# Patient Record
Sex: Female | Born: 1960
Health system: Midwestern US, Community
[De-identification: ages and names within clinical notes are randomized; demographics above are authoritative.]

## PROBLEM LIST (undated history)

## (undated) ENCOUNTER — Emergency Department (HOSPITAL_COMMUNITY): Admission: EM | Payer: Commercial Managed Care - HMO | Source: Home / Self Care

## (undated) ENCOUNTER — Emergency Department (HOSPITAL_COMMUNITY): Admission: EM | Payer: Medicare HMO | Source: Home / Self Care

## (undated) ENCOUNTER — Inpatient Hospital Stay (HOSPITAL_COMMUNITY): Payer: Self-pay

## (undated) DIAGNOSIS — J42 Unspecified chronic bronchitis: Secondary | ICD-10-CM

## (undated) DIAGNOSIS — R569 Unspecified convulsions: Secondary | ICD-10-CM

## (undated) DIAGNOSIS — R1033 Periumbilical pain: Secondary | ICD-10-CM

## (undated) DIAGNOSIS — M79671 Pain in right foot: Secondary | ICD-10-CM

## (undated) DIAGNOSIS — E785 Hyperlipidemia, unspecified: Secondary | ICD-10-CM

## (undated) DIAGNOSIS — F4312 Post-traumatic stress disorder, chronic: Secondary | ICD-10-CM

## (undated) DIAGNOSIS — R238 Other skin changes: Secondary | ICD-10-CM

## (undated) DIAGNOSIS — F50819 Binge eating disorder, unspecified: Secondary | ICD-10-CM

## (undated) DIAGNOSIS — R9431 Abnormal electrocardiogram [ECG] [EKG]: Secondary | ICD-10-CM

## (undated) DIAGNOSIS — J449 Chronic obstructive pulmonary disease, unspecified: Secondary | ICD-10-CM

## (undated) DIAGNOSIS — L821 Other seborrheic keratosis: Secondary | ICD-10-CM

## (undated) DIAGNOSIS — R001 Bradycardia, unspecified: Secondary | ICD-10-CM

## (undated) DIAGNOSIS — R109 Unspecified abdominal pain: Secondary | ICD-10-CM

## (undated) DIAGNOSIS — R296 Repeated falls: Secondary | ICD-10-CM

## (undated) DIAGNOSIS — F32A Depression, unspecified: Secondary | ICD-10-CM

## (undated) DIAGNOSIS — F22 Delusional disorders: Secondary | ICD-10-CM

## (undated) DIAGNOSIS — F5081 Binge eating disorder: Secondary | ICD-10-CM

## (undated) DIAGNOSIS — K921 Melena: Secondary | ICD-10-CM

## (undated) DIAGNOSIS — I639 Cerebral infarction, unspecified: Secondary | ICD-10-CM

## (undated) DIAGNOSIS — G931 Anoxic brain damage, not elsewhere classified: Secondary | ICD-10-CM

## (undated) DIAGNOSIS — G894 Chronic pain syndrome: Secondary | ICD-10-CM

## (undated) DIAGNOSIS — E119 Type 2 diabetes mellitus without complications: Secondary | ICD-10-CM

## (undated) DIAGNOSIS — Z9981 Dependence on supplemental oxygen: Secondary | ICD-10-CM

## (undated) DIAGNOSIS — K219 Gastro-esophageal reflux disease without esophagitis: Secondary | ICD-10-CM

## (undated) DIAGNOSIS — M1990 Unspecified osteoarthritis, unspecified site: Secondary | ICD-10-CM

## (undated) DIAGNOSIS — Z9989 Dependence on other enabling machines and devices: Secondary | ICD-10-CM

## (undated) DIAGNOSIS — R0600 Dyspnea, unspecified: Secondary | ICD-10-CM

## (undated) DIAGNOSIS — B86 Scabies: Secondary | ICD-10-CM

## (undated) DIAGNOSIS — R32 Unspecified urinary incontinence: Secondary | ICD-10-CM

## (undated) DIAGNOSIS — D649 Anemia, unspecified: Secondary | ICD-10-CM

## (undated) DIAGNOSIS — F419 Anxiety disorder, unspecified: Secondary | ICD-10-CM

## (undated) DIAGNOSIS — F319 Bipolar disorder, unspecified: Secondary | ICD-10-CM

## (undated) DIAGNOSIS — I6529 Occlusion and stenosis of unspecified carotid artery: Secondary | ICD-10-CM

## (undated) DIAGNOSIS — E876 Hypokalemia: Secondary | ICD-10-CM

## (undated) DIAGNOSIS — R0989 Other specified symptoms and signs involving the circulatory and respiratory systems: Secondary | ICD-10-CM

## (undated) DIAGNOSIS — R1010 Upper abdominal pain, unspecified: Secondary | ICD-10-CM

## (undated) DIAGNOSIS — R55 Syncope and collapse: Secondary | ICD-10-CM

## (undated) DIAGNOSIS — F209 Schizophrenia, unspecified: Secondary | ICD-10-CM

## (undated) DIAGNOSIS — I5032 Chronic diastolic (congestive) heart failure: Secondary | ICD-10-CM

## (undated) DIAGNOSIS — R4689 Other symptoms and signs involving appearance and behavior: Secondary | ICD-10-CM

## (undated) DIAGNOSIS — F250 Schizoaffective disorder, bipolar type: Secondary | ICD-10-CM

## (undated) DIAGNOSIS — T8859XA Other complications of anesthesia, initial encounter: Secondary | ICD-10-CM

## (undated) DIAGNOSIS — G4733 Obstructive sleep apnea (adult) (pediatric): Secondary | ICD-10-CM

## (undated) DIAGNOSIS — J302 Other seasonal allergic rhinitis: Secondary | ICD-10-CM

## (undated) DIAGNOSIS — I219 Acute myocardial infarction, unspecified: Secondary | ICD-10-CM

## (undated) DIAGNOSIS — F1421 Cocaine dependence, in remission: Secondary | ICD-10-CM

## (undated) DIAGNOSIS — F1410 Cocaine abuse, uncomplicated: Secondary | ICD-10-CM

## (undated) DIAGNOSIS — I469 Cardiac arrest, cause unspecified: Secondary | ICD-10-CM

## (undated) DIAGNOSIS — F17200 Nicotine dependence, unspecified, uncomplicated: Secondary | ICD-10-CM

## (undated) DIAGNOSIS — J349 Unspecified disorder of nose and nasal sinuses: Secondary | ICD-10-CM

## (undated) DIAGNOSIS — S82409A Unspecified fracture of shaft of unspecified fibula, initial encounter for closed fracture: Secondary | ICD-10-CM

## (undated) DIAGNOSIS — W19XXXA Unspecified fall, initial encounter: Secondary | ICD-10-CM

## (undated) DIAGNOSIS — K5900 Constipation, unspecified: Secondary | ICD-10-CM

## (undated) DIAGNOSIS — R062 Wheezing: Secondary | ICD-10-CM

## (undated) DIAGNOSIS — B370 Candidal stomatitis: Secondary | ICD-10-CM

## (undated) DIAGNOSIS — T4145XA Adverse effect of unspecified anesthetic, initial encounter: Secondary | ICD-10-CM

## (undated) DIAGNOSIS — I10 Essential (primary) hypertension: Secondary | ICD-10-CM

## (undated) DIAGNOSIS — R0602 Shortness of breath: Secondary | ICD-10-CM

## (undated) DIAGNOSIS — S21009A Unspecified open wound of unspecified breast, initial encounter: Secondary | ICD-10-CM

## (undated) DIAGNOSIS — N183 Chronic kidney disease, stage 3 unspecified: Secondary | ICD-10-CM

## (undated) DIAGNOSIS — R479 Unspecified speech disturbances: Secondary | ICD-10-CM

## (undated) DIAGNOSIS — S62308A Unspecified fracture of other metacarpal bone, initial encounter for closed fracture: Secondary | ICD-10-CM

## (undated) DIAGNOSIS — G9340 Encephalopathy, unspecified: Secondary | ICD-10-CM

## (undated) DIAGNOSIS — N898 Other specified noninflammatory disorders of vagina: Secondary | ICD-10-CM

## (undated) DIAGNOSIS — G988 Other disorders of nervous system: Secondary | ICD-10-CM

## (undated) DIAGNOSIS — J189 Pneumonia, unspecified organism: Secondary | ICD-10-CM

## (undated) DIAGNOSIS — F329 Major depressive disorder, single episode, unspecified: Secondary | ICD-10-CM

## (undated) DIAGNOSIS — M109 Gout, unspecified: Secondary | ICD-10-CM

## (undated) DIAGNOSIS — J45909 Unspecified asthma, uncomplicated: Secondary | ICD-10-CM

## (undated) DIAGNOSIS — F1911 Other psychoactive substance abuse, in remission: Secondary | ICD-10-CM

## (undated) DIAGNOSIS — Z9289 Personal history of other medical treatment: Secondary | ICD-10-CM

## (undated) DIAGNOSIS — N179 Acute kidney failure, unspecified: Secondary | ICD-10-CM

## (undated) DIAGNOSIS — K625 Hemorrhage of anus and rectum: Secondary | ICD-10-CM

## (undated) DIAGNOSIS — R451 Restlessness and agitation: Secondary | ICD-10-CM

## (undated) DIAGNOSIS — N189 Chronic kidney disease, unspecified: Secondary | ICD-10-CM

## (undated) DIAGNOSIS — J9611 Chronic respiratory failure with hypoxia: Secondary | ICD-10-CM

## (undated) DIAGNOSIS — E1140 Type 2 diabetes mellitus with diabetic neuropathy, unspecified: Secondary | ICD-10-CM

## (undated) DIAGNOSIS — F1910 Other psychoactive substance abuse, uncomplicated: Secondary | ICD-10-CM

## (undated) HISTORY — DX: Abnormal electrocardiogram (ECG) (EKG): R94.31

## (undated) HISTORY — DX: Anxiety disorder, unspecified: F41.9

## (undated) HISTORY — DX: Cerebral infarction, unspecified: I63.9

## (undated) HISTORY — DX: Other disorders of nervous system: G98.8

## (undated) HISTORY — DX: Binge eating disorder: F50.81

## (undated) HISTORY — DX: Acute myocardial infarction, unspecified: I21.9

## (undated) HISTORY — DX: Bradycardia, unspecified: R00.1

## (undated) HISTORY — DX: Other seasonal allergic rhinitis: J30.2

## (undated) HISTORY — DX: Occlusion and stenosis of unspecified carotid artery: I65.29

## (undated) HISTORY — DX: Chronic diastolic (congestive) heart failure: I50.32

## (undated) HISTORY — PX: UMBILICAL HERNIA REPAIR: SHX196

## (undated) HISTORY — PX: VAGINAL HYSTERECTOMY: SUR661

## (undated) HISTORY — PX: HERNIA REPAIR: SHX51

## (undated) HISTORY — DX: Hyperlipidemia, unspecified: E78.5

## (undated) HISTORY — DX: Unspecified urinary incontinence: R32

## (undated) HISTORY — DX: Chronic kidney disease, stage 3 unspecified: N18.30

## (undated) HISTORY — DX: Bipolar disorder, unspecified: F31.9

## (undated) HISTORY — DX: Binge eating disorder, unspecified: F50.819

## (undated) HISTORY — DX: Unspecified disorder of nose and nasal sinuses: J34.9

---
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
## 1992-01-17 DIAGNOSIS — Z9289 Personal history of other medical treatment: Secondary | ICD-10-CM

## 1992-01-17 HISTORY — DX: Personal history of other medical treatment: Z92.89

## 1997-05-29 ENCOUNTER — Encounter: Admission: RE | Admit: 1997-05-29 | Discharge: 1997-05-29 | Payer: Self-pay | Admitting: Family Medicine

## 1997-07-03 ENCOUNTER — Encounter: Admission: RE | Admit: 1997-07-03 | Discharge: 1997-07-03 | Payer: Self-pay | Admitting: Family Medicine

## 1997-07-07 ENCOUNTER — Encounter: Admission: RE | Admit: 1997-07-07 | Discharge: 1997-07-07 | Payer: Self-pay | Admitting: Family Medicine

## 1997-10-21 ENCOUNTER — Encounter: Admission: RE | Admit: 1997-10-21 | Discharge: 1997-10-21 | Payer: Self-pay | Admitting: Family Medicine

## 1997-10-23 ENCOUNTER — Encounter: Admission: RE | Admit: 1997-10-23 | Discharge: 1997-10-23 | Payer: Self-pay | Admitting: Family Medicine

## 1998-01-28 ENCOUNTER — Encounter: Admission: RE | Admit: 1998-01-28 | Discharge: 1998-01-28 | Payer: Self-pay | Admitting: Family Medicine

## 1998-02-15 ENCOUNTER — Encounter: Admission: RE | Admit: 1998-02-15 | Discharge: 1998-02-15 | Payer: Self-pay | Admitting: Family Medicine

## 1998-03-11 ENCOUNTER — Encounter: Admission: RE | Admit: 1998-03-11 | Discharge: 1998-03-11 | Payer: Self-pay | Admitting: Family Medicine

## 1998-03-15 ENCOUNTER — Encounter: Admission: RE | Admit: 1998-03-15 | Discharge: 1998-03-15 | Payer: Self-pay | Admitting: Family Medicine

## 1998-03-23 ENCOUNTER — Encounter: Admission: RE | Admit: 1998-03-23 | Discharge: 1998-03-23 | Payer: Self-pay | Admitting: Sports Medicine

## 1998-07-01 ENCOUNTER — Encounter: Admission: RE | Admit: 1998-07-01 | Discharge: 1998-07-01 | Payer: Self-pay | Admitting: Family Medicine

## 1998-07-12 ENCOUNTER — Encounter: Admission: RE | Admit: 1998-07-12 | Discharge: 1998-07-12 | Payer: Self-pay | Admitting: Family Medicine

## 1998-08-06 ENCOUNTER — Encounter: Admission: RE | Admit: 1998-08-06 | Discharge: 1998-08-06 | Payer: Self-pay | Admitting: Family Medicine

## 1998-11-01 ENCOUNTER — Emergency Department (HOSPITAL_COMMUNITY): Admission: EM | Admit: 1998-11-01 | Discharge: 1998-11-01 | Payer: Self-pay | Admitting: Emergency Medicine

## 1998-11-01 ENCOUNTER — Encounter: Payer: Self-pay | Admitting: Emergency Medicine

## 2004-12-15 ENCOUNTER — Ambulatory Visit: Payer: Self-pay | Admitting: Family Medicine

## 2004-12-19 ENCOUNTER — Ambulatory Visit: Payer: Self-pay | Admitting: *Deleted

## 2005-04-12 ENCOUNTER — Emergency Department (HOSPITAL_COMMUNITY): Admission: EM | Admit: 2005-04-12 | Discharge: 2005-04-13 | Payer: Self-pay | Admitting: Emergency Medicine

## 2005-06-21 ENCOUNTER — Ambulatory Visit: Payer: Self-pay | Admitting: Nurse Practitioner

## 2005-07-10 ENCOUNTER — Encounter (HOSPITAL_COMMUNITY): Admission: RE | Admit: 2005-07-10 | Discharge: 2005-10-04 | Payer: Self-pay | Admitting: Internal Medicine

## 2005-07-10 ENCOUNTER — Ambulatory Visit: Payer: Self-pay | Admitting: Internal Medicine

## 2005-12-21 ENCOUNTER — Inpatient Hospital Stay (HOSPITAL_COMMUNITY): Admission: EM | Admit: 2005-12-21 | Discharge: 2005-12-22 | Payer: Self-pay | Admitting: Emergency Medicine

## 2008-03-26 IMAGING — CR DG CHEST 2V
2 series · 2 of 2 positions shown · non-contrast
Comparison: None available.

CLINICAL DATA: 44-year-old ? productive cough, shortness of breath. 
CHEST - 2 VIEW:

[w chest pa]
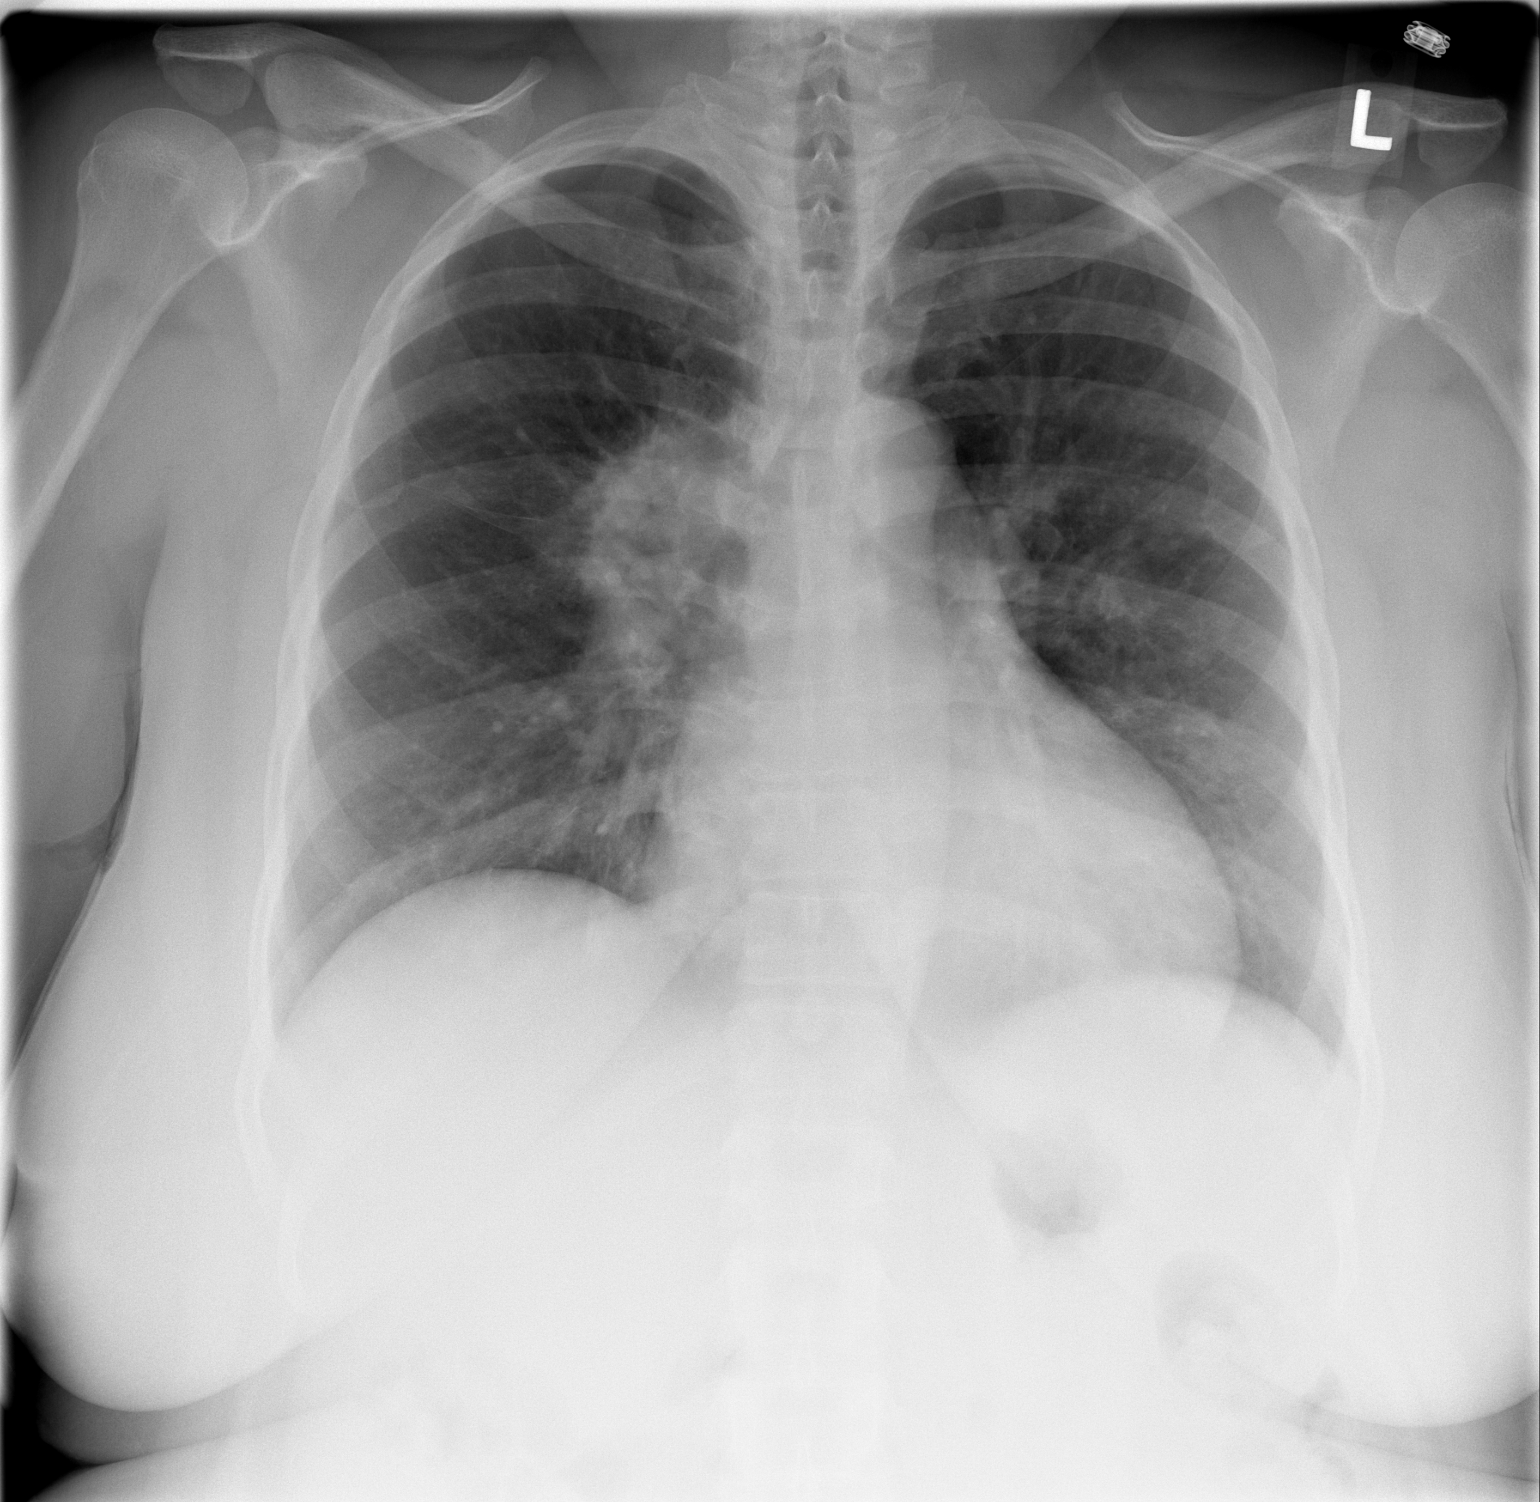

[w chest lat]
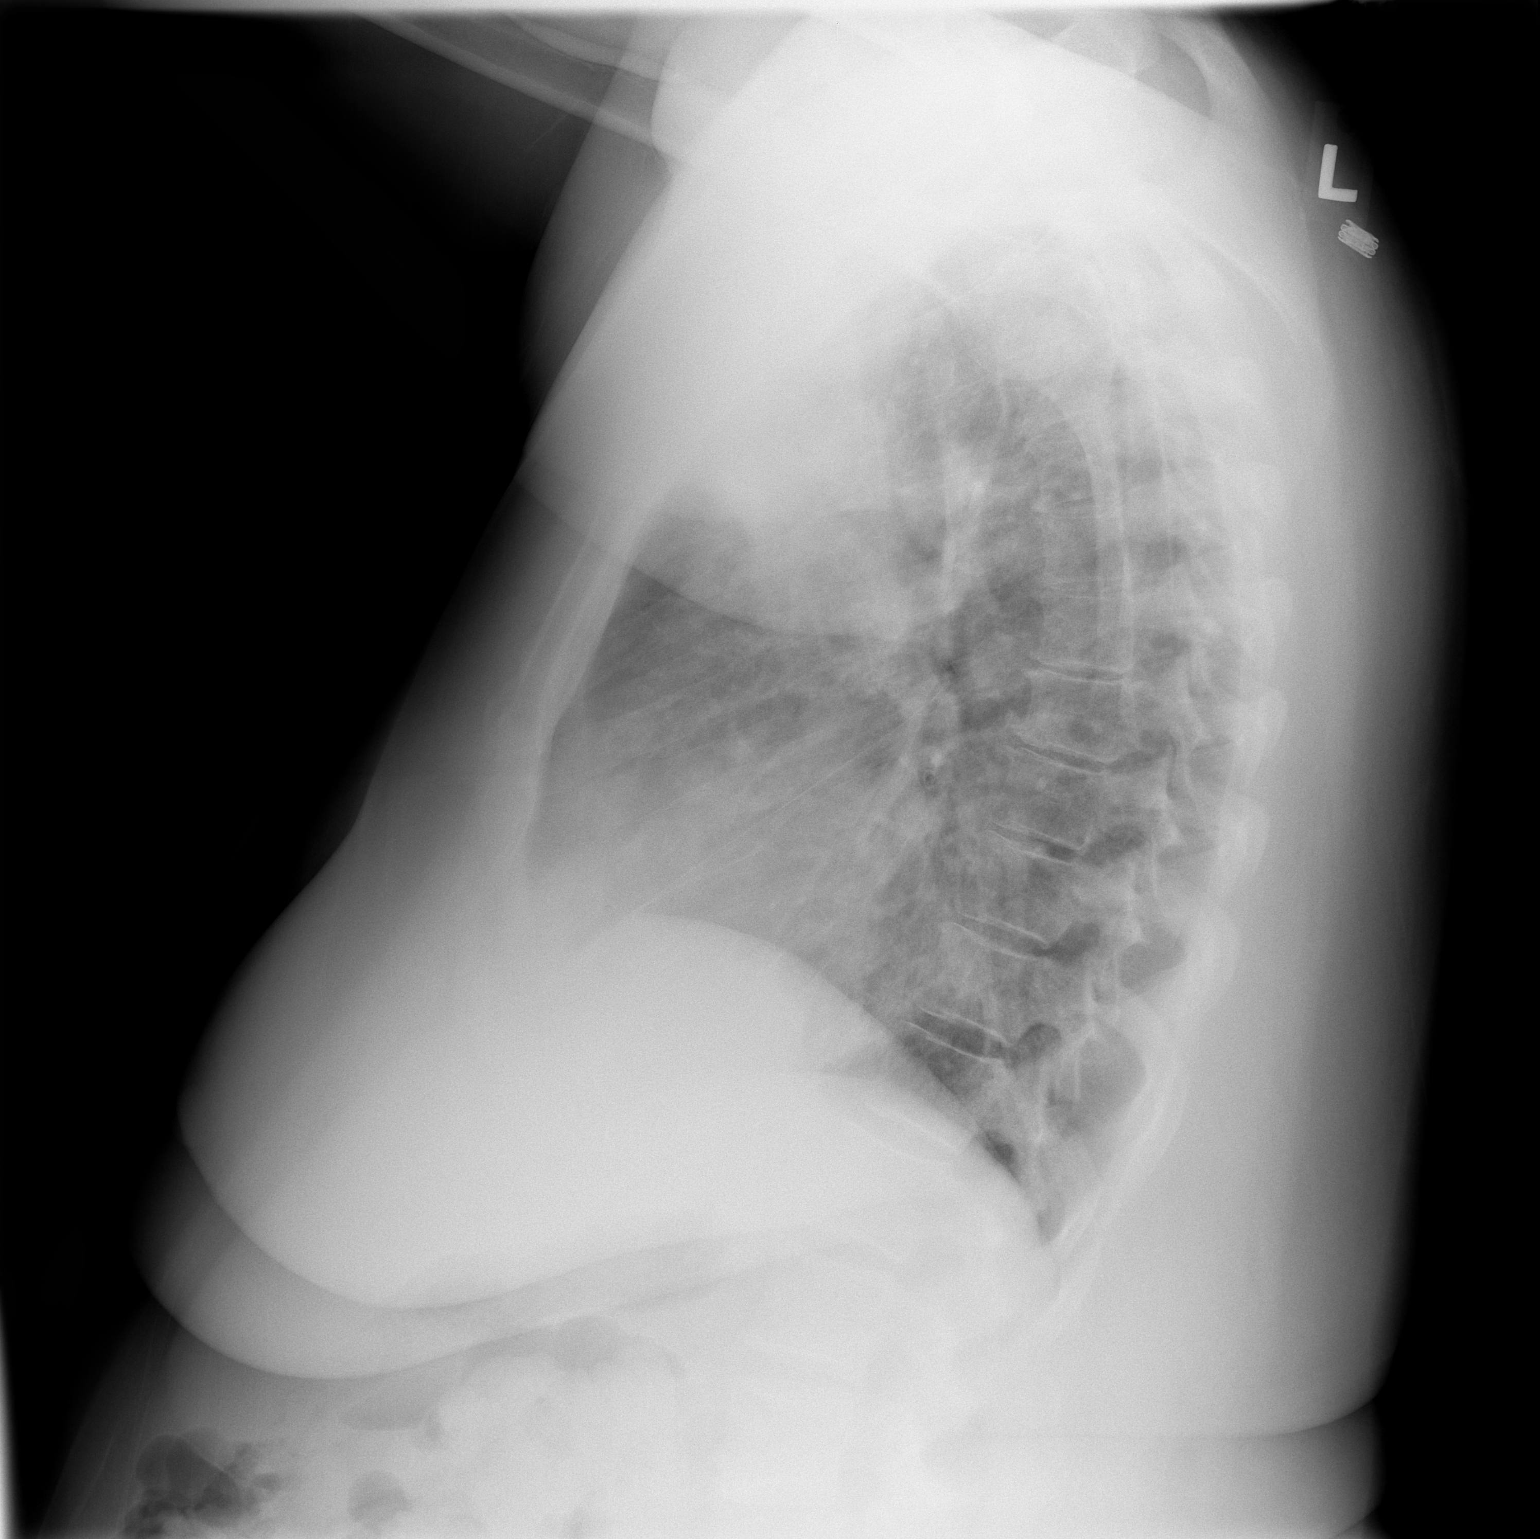

[2 of 2 positions shown; findings below may reference images not displayed]

FINDINGS: Cardiac silhouette is borderline.  Prominent right hilar contour.  This may be an infiltrate in the anterior segment of the right upper lobe but could not exclude a mass.  Recommend a posttreatment follow-up chest x-ray to make sure this resolves.  No effusions.  The bony structures are intact.
IMPRESSION: 1.  Right hilar density is most likely an infiltrate given the patient?s age and symptoms.  I would however recommend a follow-up posttreatment chest x-ray to make sure that this resolves.  
2.  Borderline heart size.

## 2008-12-03 IMAGING — CR DG CHEST 1V PORT
1 series · 1 of 1 positions shown · non-contrast
Comparison: 04/13/05.

CLINICAL DATA: Chest pain and dyspnea. 
 PORTABLE CHEST - 1 VIEW ? 12/21/05 (9944 HOURS):

[view not recorded]
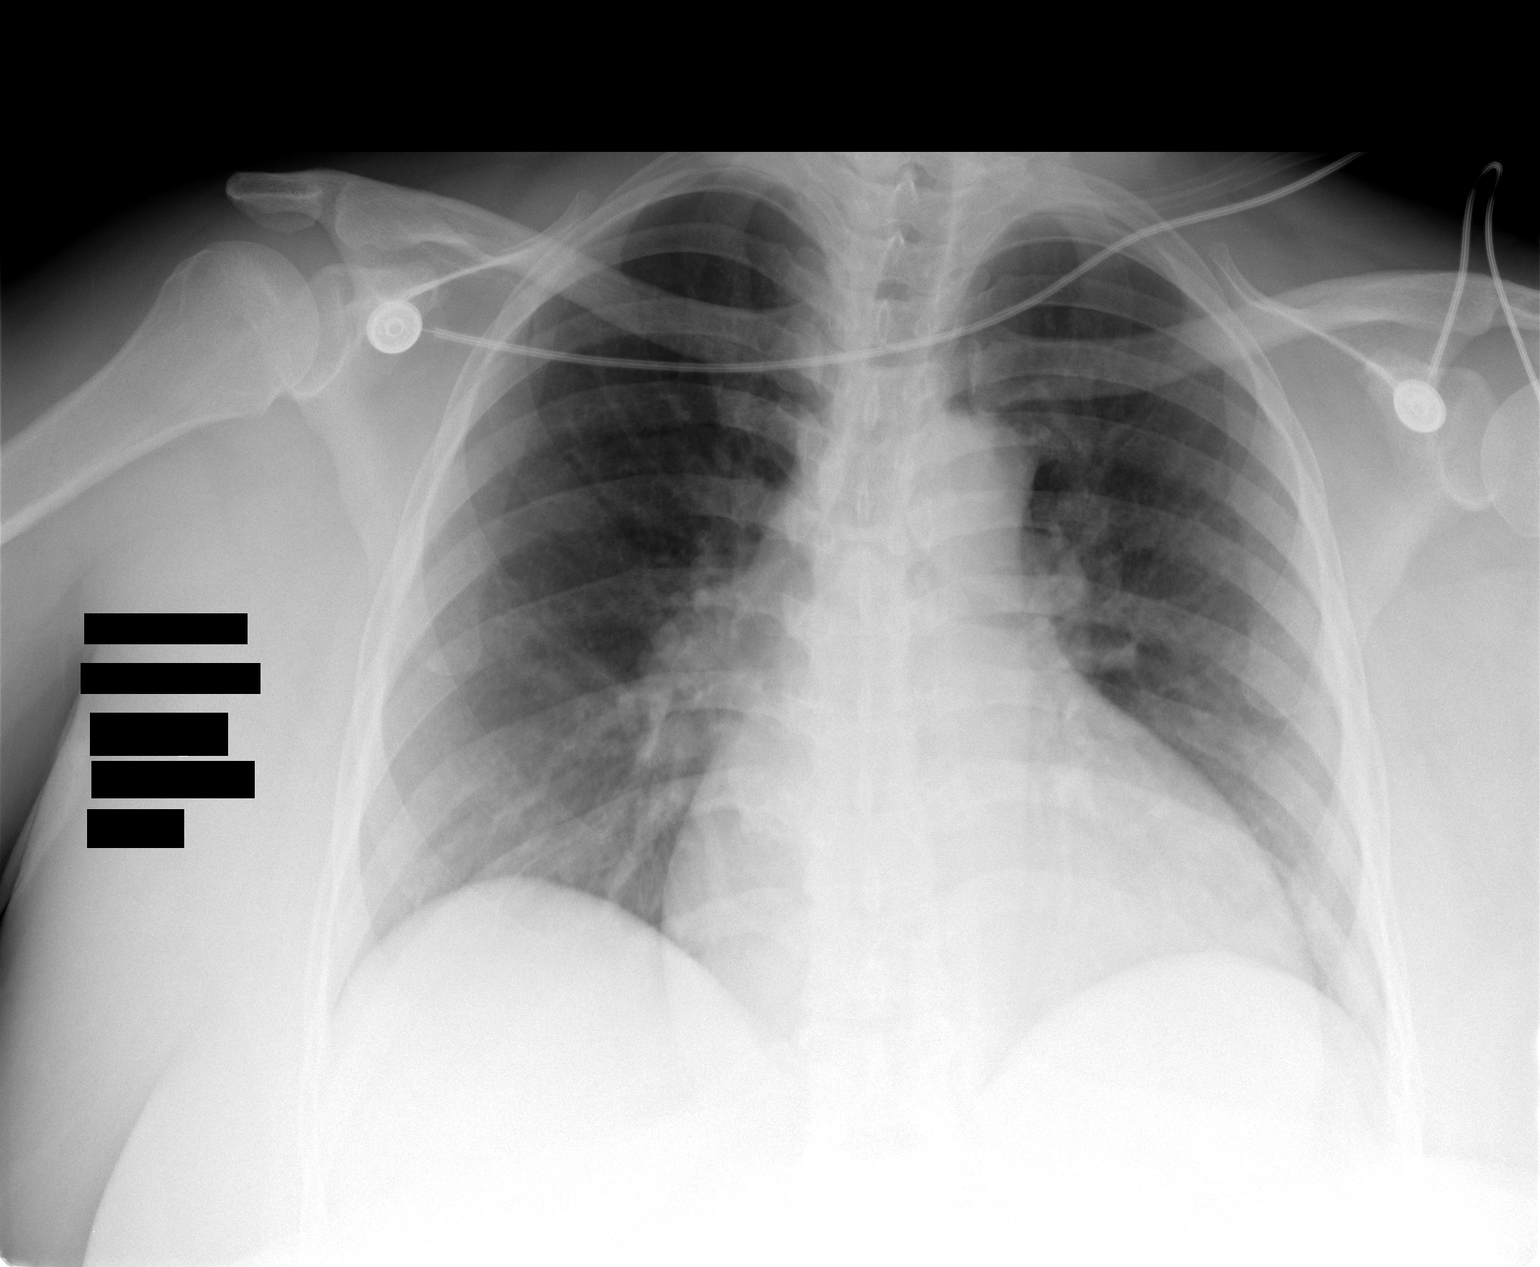

[1 of 1 positions shown; findings below may reference images not displayed]

FINDINGS: There are low lung volumes and the heart remains mildly enlarged.   The previously demonstrated right perihilar density has resolved.  The lungs are now clear.  There is no pleural effusion or pneumothorax.
IMPRESSION: Cardiomegaly with low lung volumes.  No acute chest findings.

## 2010-10-03 ENCOUNTER — Emergency Department (HOSPITAL_COMMUNITY): Payer: Medicaid Other

## 2010-10-03 ENCOUNTER — Inpatient Hospital Stay (INDEPENDENT_AMBULATORY_CARE_PROVIDER_SITE_OTHER)
Admission: RE | Admit: 2010-10-03 | Discharge: 2010-10-03 | Disposition: A | Discharge status: Home / Self Care | Payer: Medicaid Other | Source: Ambulatory Visit | Attending: Family Medicine | Admitting: Family Medicine

## 2010-10-03 ENCOUNTER — Emergency Department (HOSPITAL_COMMUNITY)
Admission: EM | Admit: 2010-10-03 | Discharge: 2010-10-03 | Discharge status: Against Medical Advice | Payer: Medicaid Other | Source: Home / Self Care | Attending: Emergency Medicine | Admitting: Emergency Medicine

## 2010-10-03 ENCOUNTER — Inpatient Hospital Stay (HOSPITAL_COMMUNITY)
Admission: EM | Admit: 2010-10-03 | Discharge: 2010-10-06 | DRG: 190 | Disposition: A | Discharge status: Home / Self Care | Payer: Medicaid Other | Attending: Internal Medicine | Admitting: Internal Medicine

## 2010-10-03 DIAGNOSIS — Z8673 Personal history of transient ischemic attack (TIA), and cerebral infarction without residual deficits: Secondary | ICD-10-CM

## 2010-10-03 DIAGNOSIS — I252 Old myocardial infarction: Secondary | ICD-10-CM

## 2010-10-03 DIAGNOSIS — J189 Pneumonia, unspecified organism: Secondary | ICD-10-CM | POA: Diagnosis present

## 2010-10-03 DIAGNOSIS — R0989 Other specified symptoms and signs involving the circulatory and respiratory systems: Secondary | ICD-10-CM

## 2010-10-03 DIAGNOSIS — I509 Heart failure, unspecified: Secondary | ICD-10-CM | POA: Diagnosis present

## 2010-10-03 DIAGNOSIS — I517 Cardiomegaly: Secondary | ICD-10-CM | POA: Diagnosis present

## 2010-10-03 DIAGNOSIS — E119 Type 2 diabetes mellitus without complications: Secondary | ICD-10-CM | POA: Diagnosis present

## 2010-10-03 DIAGNOSIS — R079 Chest pain, unspecified: Secondary | ICD-10-CM

## 2010-10-03 DIAGNOSIS — G4733 Obstructive sleep apnea (adult) (pediatric): Secondary | ICD-10-CM | POA: Diagnosis present

## 2010-10-03 DIAGNOSIS — T380X5A Adverse effect of glucocorticoids and synthetic analogues, initial encounter: Secondary | ICD-10-CM | POA: Diagnosis present

## 2010-10-03 DIAGNOSIS — Z889 Allergy status to unspecified drugs, medicaments and biological substances status: Secondary | ICD-10-CM

## 2010-10-03 DIAGNOSIS — D72829 Elevated white blood cell count, unspecified: Secondary | ICD-10-CM | POA: Diagnosis present

## 2010-10-03 DIAGNOSIS — M069 Rheumatoid arthritis, unspecified: Secondary | ICD-10-CM | POA: Diagnosis present

## 2010-10-03 DIAGNOSIS — J441 Chronic obstructive pulmonary disease with (acute) exacerbation: Principal | ICD-10-CM | POA: Diagnosis present

## 2010-10-03 DIAGNOSIS — R32 Unspecified urinary incontinence: Secondary | ICD-10-CM | POA: Diagnosis present

## 2010-10-03 DIAGNOSIS — R0609 Other forms of dyspnea: Secondary | ICD-10-CM

## 2010-10-03 DIAGNOSIS — J9610 Chronic respiratory failure, unspecified whether with hypoxia or hypercapnia: Secondary | ICD-10-CM | POA: Diagnosis present

## 2010-10-03 DIAGNOSIS — I10 Essential (primary) hypertension: Secondary | ICD-10-CM | POA: Diagnosis present

## 2010-10-03 DIAGNOSIS — Z79899 Other long term (current) drug therapy: Secondary | ICD-10-CM

## 2010-10-03 DIAGNOSIS — Z882 Allergy status to sulfonamides status: Secondary | ICD-10-CM

## 2010-10-03 DIAGNOSIS — Z9981 Dependence on supplemental oxygen: Secondary | ICD-10-CM

## 2010-10-03 DIAGNOSIS — I5030 Unspecified diastolic (congestive) heart failure: Secondary | ICD-10-CM | POA: Diagnosis present

## 2010-10-04 LAB — DIFFERENTIAL
Basophils Absolute: 0 10*3/uL (ref 0.0–0.1)
Basophils Relative: 0 % (ref 0–1)
Eosinophils Absolute: 0.3 10*3/uL (ref 0.0–0.7)
Eosinophils Relative: 4 % (ref 0–5)
Lymphocytes Relative: 35 % (ref 12–46)
Lymphs Abs: 2.6 10*3/uL (ref 0.7–4.0)
Monocytes Absolute: 0.4 10*3/uL (ref 0.1–1.0)
Monocytes Relative: 6 % (ref 3–12)
Neutro Abs: 4.2 10*3/uL (ref 1.7–7.7)
Neutrophils Relative %: 56 % (ref 43–77)

## 2010-10-04 LAB — GLUCOSE, CAPILLARY
Glucose-Capillary: 113 mg/dL — ABNORMAL HIGH (ref 70–99)
Glucose-Capillary: 146 mg/dL — ABNORMAL HIGH (ref 70–99)
Glucose-Capillary: 144 mg/dL — ABNORMAL HIGH (ref 70–99)

## 2010-10-04 LAB — CBC
HCT: 32.4 % — ABNORMAL LOW (ref 36.0–46.0)
Hemoglobin: 10.6 g/dL — ABNORMAL LOW (ref 12.0–15.0)
MCH: 30.1 pg (ref 26.0–34.0)
MCHC: 32.7 g/dL (ref 30.0–36.0)
MCV: 92 fL (ref 78.0–100.0)
Platelets: 302 10*3/uL (ref 150–400)
RBC: 3.52 MIL/uL — ABNORMAL LOW (ref 3.87–5.11)
RDW: 14.7 % (ref 11.5–15.5)
WBC: 7.5 10*3/uL (ref 4.0–10.5)

## 2010-10-04 LAB — POCT I-STAT 3, ART BLOOD GAS (G3+)
Acid-Base Excess: 1 mmol/L (ref 0.0–2.0)
Bicarbonate: 27.4 mEq/L — ABNORMAL HIGH (ref 20.0–24.0)
O2 Saturation: 97 %
Patient temperature: 98.6
TCO2: 29 mmol/L (ref 0–100)
pCO2 arterial: 51.3 mmHg — ABNORMAL HIGH (ref 35.0–45.0)
pH, Arterial: 7.336 — ABNORMAL LOW (ref 7.350–7.400)
pO2, Arterial: 97 mmHg (ref 80.0–100.0)

## 2010-10-04 LAB — BASIC METABOLIC PANEL
BUN: 14 mg/dL (ref 6–23)
CO2: 29 mEq/L (ref 19–32)
Calcium: 9.3 mg/dL (ref 8.4–10.5)
Chloride: 105 mEq/L (ref 96–112)
Creatinine, Ser: 0.97 mg/dL (ref 0.50–1.10)
GFR calc Af Amer: >60 mL/min (ref >60)
GFR calc non Af Amer: >60 mL/min (ref >60)
Glucose, Bld: 81 mg/dL (ref 70–99)
Potassium: 3.6 mEq/L (ref 3.5–5.1)
Sodium: 141 mEq/L (ref 135–145)

## 2010-10-04 LAB — URINALYSIS, ROUTINE W REFLEX MICROSCOPIC
Bilirubin Urine: NEGATIVE
Glucose, UA: NEGATIVE mg/dL
Hgb urine dipstick: NEGATIVE
Ketones, ur: NEGATIVE mg/dL
Leukocytes, UA: NEGATIVE
Nitrite: NEGATIVE
Protein, ur: NEGATIVE mg/dL
Specific Gravity, Urine: 1.022 (ref 1.005–1.030)
Urobilinogen, UA: 1 mg/dL (ref 0.0–1.0)
pH: 5.5 (ref 5.0–8.0)

## 2010-10-04 LAB — POCT I-STAT TROPONIN I
Troponin i, poc: 0.01 ng/mL (ref 0.00–0.08)
Troponin i, poc: 0.01 ng/mL (ref 0.00–0.08)

## 2010-10-04 LAB — PRO B NATRIURETIC PEPTIDE: Pro B Natriuretic peptide (BNP): 176.5 pg/mL — ABNORMAL HIGH (ref 0–125)

## 2010-10-05 LAB — CBC
HCT: 33.7 % — ABNORMAL LOW (ref 36.0–46.0)
Hemoglobin: 10.7 g/dL — ABNORMAL LOW (ref 12.0–15.0)
MCH: 29.6 pg (ref 26.0–34.0)
MCHC: 31.8 g/dL (ref 30.0–36.0)
MCV: 93.1 fL (ref 78.0–100.0)
Platelets: 349 10*3/uL (ref 150–400)
RBC: 3.62 MIL/uL — ABNORMAL LOW (ref 3.87–5.11)
RDW: 15.2 % (ref 11.5–15.5)
WBC: 17.7 10*3/uL — ABNORMAL HIGH (ref 4.0–10.5)

## 2010-10-05 LAB — HEMOGLOBIN A1C
Hgb A1c MFr Bld: 5.8 % — ABNORMAL HIGH (ref <5.7)
Mean Plasma Glucose: 120 mg/dL — ABNORMAL HIGH (ref <117)

## 2010-10-05 LAB — URINE CULTURE
Colony Count: 90000
Culture  Setup Time: 201209180848

## 2010-10-05 LAB — GLUCOSE, CAPILLARY
Glucose-Capillary: 123 mg/dL — ABNORMAL HIGH (ref 70–99)
Glucose-Capillary: 136 mg/dL — ABNORMAL HIGH (ref 70–99)
Glucose-Capillary: 165 mg/dL — ABNORMAL HIGH (ref 70–99)
Glucose-Capillary: 95 mg/dL (ref 70–99)
Glucose-Capillary: 97 mg/dL (ref 70–99)

## 2010-10-05 LAB — BASIC METABOLIC PANEL
BUN: 19 mg/dL (ref 6–23)
CO2: 28 mEq/L (ref 19–32)
Calcium: 9.9 mg/dL (ref 8.4–10.5)
Chloride: 104 mEq/L (ref 96–112)
Creatinine, Ser: 0.95 mg/dL (ref 0.50–1.10)
GFR calc Af Amer: >60 mL/min (ref >60)
GFR calc non Af Amer: >60 mL/min (ref >60)
Glucose, Bld: 105 mg/dL — ABNORMAL HIGH (ref 70–99)
Potassium: 4.2 mEq/L (ref 3.5–5.1)
Sodium: 142 mEq/L (ref 135–145)

## 2010-10-06 LAB — CBC
HCT: 33.2 % — ABNORMAL LOW (ref 36.0–46.0)
Hemoglobin: 10.5 g/dL — ABNORMAL LOW (ref 12.0–15.0)
MCH: 29.4 pg (ref 26.0–34.0)
MCHC: 31.6 g/dL (ref 30.0–36.0)
MCV: 93 fL (ref 78.0–100.0)
Platelets: 341 10*3/uL (ref 150–400)
RBC: 3.57 MIL/uL — ABNORMAL LOW (ref 3.87–5.11)
RDW: 15.1 % (ref 11.5–15.5)
WBC: 13.4 10*3/uL — ABNORMAL HIGH (ref 4.0–10.5)

## 2010-10-06 LAB — BASIC METABOLIC PANEL
BUN: 20 mg/dL (ref 6–23)
CO2: 30 mEq/L (ref 19–32)
Calcium: 9.7 mg/dL (ref 8.4–10.5)
Chloride: 103 mEq/L (ref 96–112)
Creatinine, Ser: 0.88 mg/dL (ref 0.50–1.10)
GFR calc Af Amer: >60 mL/min (ref >60)
GFR calc non Af Amer: >60 mL/min (ref >60)
Glucose, Bld: 132 mg/dL — ABNORMAL HIGH (ref 70–99)
Potassium: 4.3 mEq/L (ref 3.5–5.1)
Sodium: 140 mEq/L (ref 135–145)

## 2010-10-06 LAB — GLUCOSE, CAPILLARY
Glucose-Capillary: 131 mg/dL — ABNORMAL HIGH (ref 70–99)
Glucose-Capillary: 98 mg/dL (ref 70–99)
Glucose-Capillary: 127 mg/dL — ABNORMAL HIGH (ref 70–99)
Glucose-Capillary: 152 mg/dL — ABNORMAL HIGH (ref 70–99)

## 2010-10-18 ENCOUNTER — Ambulatory Visit (INDEPENDENT_AMBULATORY_CARE_PROVIDER_SITE_OTHER): Payer: Medicaid Other | Admitting: Emergency Medicine

## 2010-10-18 ENCOUNTER — Encounter: Payer: Self-pay | Admitting: Emergency Medicine

## 2010-10-18 DIAGNOSIS — E119 Type 2 diabetes mellitus without complications: Secondary | ICD-10-CM

## 2010-10-18 DIAGNOSIS — G4733 Obstructive sleep apnea (adult) (pediatric): Secondary | ICD-10-CM

## 2010-10-18 DIAGNOSIS — Z9989 Dependence on other enabling machines and devices: Secondary | ICD-10-CM

## 2010-10-18 DIAGNOSIS — J45909 Unspecified asthma, uncomplicated: Secondary | ICD-10-CM

## 2010-10-18 DIAGNOSIS — E669 Obesity, unspecified: Secondary | ICD-10-CM

## 2010-10-18 DIAGNOSIS — I10 Essential (primary) hypertension: Secondary | ICD-10-CM

## 2010-10-18 HISTORY — DX: Type 2 diabetes mellitus without complications: E11.9

## 2010-10-18 HISTORY — DX: Morbid (severe) obesity due to excess calories: E66.01

## 2010-10-18 HISTORY — DX: Obstructive sleep apnea (adult) (pediatric): G47.33

## 2010-10-18 HISTORY — DX: Unspecified asthma, uncomplicated: J45.909

## 2010-10-18 NOTE — Patient Instructions (Signed)
Continue your Symbicort and Singulair as you are taking them Use ProAir if needed for shortness of breath We will obtain copies of your records from [place].  Follow with Dr Lamonte Sakai in 1 month

## 2010-10-18 NOTE — Assessment & Plan Note (Signed)
Unclear severity or pattern. She uses albuterol q4h in addition to symbicort. She says it is to help with secretion clearance.  - need to obtain all testing, PFT, etc, records from [place] (Dr Manuela Neptune) - continue these meds for now

## 2010-10-18 NOTE — Assessment & Plan Note (Signed)
-   continue CPAP and obtain copies of PSG

## 2010-10-18 NOTE — Progress Notes (Signed)
  Subjective:    Patient ID: Tiffany [name], female    DOB: 1960/10/09, 50 y.o.   MRN: [id number]  HPI 50 yo never smoker, hx CAD/MI, HTN, DM, hyperlipidemia, allergic rhinitis, OSA (CPAP, Advanced). Dx with asthma as a child, started on O2 in 2000. Uses it prn. Tells me that she was rx for CAP with hemoptysis Zigmund Daniel) in 2009.  Has been on Singulair and Symbicort. Also uses ProAir prn, often every 4 hours. Last flare  - 1 week ago was in [facility] for exacerbation. Last PFT were 2 yrs ago in [place].    Review of Systems  Constitutional: Positive for unexpected weight change. Negative for fever.  HENT: Positive for nosebleeds, congestion, sore throat, trouble swallowing, postnasal drip and sinus pressure. Negative for ear pain, rhinorrhea, sneezing and dental problem.   Eyes: Negative for redness and itching.  Respiratory: Positive for cough, chest tightness, shortness of breath and wheezing.   Cardiovascular: Positive for leg swelling. Negative for palpitations.  Gastrointestinal: Positive for nausea. Negative for vomiting.  Genitourinary: Positive for dysuria.  Musculoskeletal: Positive for joint swelling.  Skin: Negative for rash.  Neurological: Positive for headaches.  Hematological: Bruises/bleeds easily.  Psychiatric/Behavioral: Negative for dysphoric mood. The patient is nervous/anxious.    Past Medical History  Diagnosis Date  . High blood pressure   . Incontinence   . Anxiety   . Manic depression   . Heart attack   . Diabetes mellitus     class 2  . Asthma   . Emphysema   . High cholesterol   . Stroke   . Sinus trouble   . Seasonal allergies   . Sleep apnea   . Disorder of nervous system      Family History  Problem Relation Age of Onset  . Cancer Father     prostate  . Heart failure      cousin  . Cancer Mother     lung     History   Social History  . Marital Status: Widowed    Spouse Name: N/A    Number of Children: 3  . Years of Education: N/A    Occupational History  . disabled     factory production   Social History Main Topics  . Smoking status: Never Smoker   . Smokeless tobacco: Not on file  . Alcohol Use: No  . Drug Use: No  . Sexually Active: Not on file   Other Topics Concern  . Not on file   Social History Narrative  . No narrative on file     Allergies  Allergen Reactions  . Codeine     nausea  . Sulfa Antibiotics      No outpatient prescriptions prior to visit.       Objective:   Physical Exam  Gen: Pleasant, obese woman, in no distress,  Slightly depressed affect  ENT: No lesions,  mouth clear,  oropharynx dry, no postnasal drip  Neck: No JVD, no TMG, no carotid bruits  Lungs: No use of accessory muscles, no dullness to percussion, clear without rales or rhonchi  Cardiovascular: RRR, heart sounds normal, no murmur or gallops, no peripheral edema  Musculoskeletal: No deformities, no cyanosis or clubbing  Neuro: alert, non focal  Skin: Warm, no lesions or rashes      Assessment & Plan:

## 2010-10-19 NOTE — H&P (Signed)
Tiffany [name], Tiffany [name]               ACCOUNT NO.:  [id number]  MEDICAL RECORD NO.:  [id number]  LOCATION:  [facility]                         FACILITY:  [facility]  PHYSICIAN:  Ria Bush, MD         DATE OF BIRTH:  25-Apr-1960  DATE OF ADMISSION:  10/03/2010 DATE OF DISCHARGE:                             HISTORY & PHYSICAL   PRIMARY CARE PHYSICIAN:  Unknown.  She was receiving most of her care in [place] and just moved here.  CHIEF COMPLAINT:  Difficulty breathing.  HISTORY OF PRESENT ILLNESS:  This is a 50 year old female, very obese with a reported history of asthma/bronchitis/emphysema, history of MI, history of CVA, diabetes, hypertension, on home O2, who presents with a 1-week history of a cough that was initially clearish sputum and has been progressively getting thicker and more white and green.  She has also been complaining of difficulty breathing and wheezing for the past weekend, associated with chest tightness and a feeling that there is a lot of "secretions" in her lungs.  She states that these sensations are consistent with prior episodes of asthma.  She was getting worried because she apparently had a bad pneumonia in January of this year and was intubated in the ICU at a hospital in [place] near [place]  She came to the emergency room where her initial vitals were temperature 98.1, blood pressure 175/119, pulse 88, and respirations 19.  She was satting 95%.  Her workup in the emergency room showed a negative troponin x2, a chemistry panel fairly unremarkable, CBC fairly unremarkable, and a chest x-ray showing mild cardiomegaly with central vascular congestion and interstitial prominence, but no focal consolidation.  She was noted to be very somnolent and not being very conversant, and so she got an ABG which showed a pH of 7.33, pCO2 of 51, a bicarb of 27, and a pO2 of 97%, satting 97%.  She was started on BiPAP which she reports compliance with at home.  The patient  otherwise denies any nausea, vomiting, abdominal pain, diarrhea, or any other dizziness or lightheadedness; however, she does report that she fell 3 times last Wednesday on the street due to some weakness, but did not have any loss of consciousness.  Review of systems otherwise unremarkable.  She does report that she had been having difficulty getting her nebulizers because she moved here from [place] 3 days ago and all her meds and nebs are locked up.  PAST MEDICAL HISTORY:  Scant in the E-Chart, but noted for history of MI, history of CVA, history of asthma/bronchitis/emphysema, and she states she is "on and off" home oxygen.  There is also some unclear history of seizures/falls/syncope.  She also reports history of incontinence, rheumatoid arthritis, diabetes, hypertension, and states that she uses a CPAP at home.  Home medication list is unable to be reconciled fully because the patient does not really know where she takes.  She did provide a list and includes: 1. Clonazepam. 2. Lisinopril. 3. Oxybutynin chloride. 4. Ranitidine. 5. Clonazepam. 6. Furosemide. 7. Metformin. 8. Naproxen sodium. 9. Proventil. 10.Valacyclovir. 11.Singulair. 12.Tramadol/acetaminophen. 13.Verapamil. 14.Flexeril. 15.Singulair.  She has allergies that she reports to [place], and also  SOME TYPE OF EPIDURAL INFUSION which she was given at birth to her son which caused some type of bad reaction.  SOCIAL HISTORY:  [name] a never smoker and does not drink.  [name] on disability due to her comorbidities.  She just moved here from [place] about 3 days ago and most of her doctor's care has been in [place].  FAMILY HISTORY:  Her mother and father had heart disease.  Her aunt died of CHF.  Her cousin had diabetes and requiring a kidney transplant.  PHYSICAL EXAMINATION:  VITAL SIGNS:  Blood pressure was 116/79, pulse 116/79, pulse 69-70, 95% on room air, respirations 17. GENERAL:  She  is a very large woman in the ED stretcher with her son sleeping in a chair next to her.  [name] wearing CPAP and breathing comfortably and resting well.  I woke her up, took CPAP off, and she was conversant, able to relate her history.  She appears lethargic, but is appropriate. HEENT:  Her pupils are equal, round, reactive to light.  Her extraocular muscles are intact.  Her sclerae are clear.  Her mouth is very dry appearing, but overall normal with no oropharyngeal lesions. LUNGS:  Poor breath sounds.  She has bilateral coarse sounding breath sounds at her bases that improves as you move towards the apex.  There is some end-expiratory wheezing, fairly transiently and diffusely. HEART:  Regular rate and rhythm without any murmurs or gallops.  I do not appreciate any adventitious heart sounds. ABDOMEN:  Massive.  It is soft, nontender, nondistended and benign. EXTREMITIES:  Lower extremities were fairly warm, well perfused.  Her feet are bit more cool though.  She has no bilateral lower extremity edema. NEUROLOGIC:  [name] lethargic, but opens her eyes, answers questions appropriately, is able to give a history, dozes off intermittently, but is easily awoken.  [name] moving her upper extremities and able to sit forward in the bed and try to put her CPAP mask on appropriately.  LABORATORY WORK:  She has had troponins negative x2.  White blood cell count is 7.5, hematocrit 32.4, platelet count 302.  Her chemistry panel was unremarkable including a BUN and creatinine of 14 and 0.97.  Her UA is negative.  She had an ABG showing a pH of 7.33, pCO2 of 51, pO2 of 97, bicarb of 27, and O2 sat of 97%.  RADIOGRAPHY STUDIES:  Chest x-ray shows heart size upper normal limits to mildly enlarged with central vascular congestion, mild interstitial prominence without focal consolidation.  EKG is normal sinus rhythm, normal axis, some left atrial enlargement in V1, QRS is overall normal with a bit  late R-wave progression.  No frank ST-T segment changes, nonspecific T-wave flattening in III and aVF, overall fairly unchanged compared to prior and overall fairly unimpressive.  IMPRESSION:  This is a 50 year old lady who is quite obese and with a reported history of myocardial infarction, cerebrovascular accident, diabetes, hypertension, and asthma/bronchitis/emphysema, on home O2, who presents with a cough x1 week and increased difficulty breathing. 1. Shortness of breath.  This is likely multifactorial.  I suspect     that despite our lack of clear records of her underlying     comorbidities that she probably does have bad lung disease for     which she takes home O2.  Given her habitus, I would be surprised     if she did not have diastolic heart failure as well as is evidenced  by the central congestion on her chest x-ray.  We will treat her     with scheduled nebs.  We will give her azithromycin for presumed     atypical pneumonia.  We will treat with boluses of 20 mg of IV     Lasix q.12 and we will admit her to the Step-Down Unit for     continued CPAP.  She takes CPAP at home and I do not think that she     acutely actually needs this could because her ABG is actually     fairly well compensated; therefore, I do not think that she is in     acute respiratory failure. 2. She will need a medication reconciliation during the daytime. 3. Diabetes.  We will continue her on insulin sliding scale while she     is in-house and hold her metformin. 4. Fluid, electrolytes, and nutrition.  We will hold off on giving her     any IV fluids.  We will give her a regular heart healthy diabetic     diet. 5. IV access.  She currently has no IV access and this has been an     issue through her stay in the emergency room.  I convinced her to     have the IV Access Team attempt 1 more time.  If she is unable to     get IV access, really the only thing that will be going IV with the     Lasix  which should be converted to oral. 6. Code status is presumed full.  I was unable to discuss this at this     point. 7. The patient will be admitted to the Step-Down Unit given her need     for CPAP to Team III.          ______________________________ Ria Bush, MD     EB/MEDQ  D:  10/04/2010  T:  10/04/2010  Job:  [id number]  Electronically Signed by Ria Bush MD on 10/19/2010 12:16:41 PM

## 2010-10-20 ENCOUNTER — Telehealth: Payer: Self-pay | Admitting: Emergency Medicine

## 2010-10-20 NOTE — Telephone Encounter (Signed)
Received copies from Dr. Manuela Neptune @ [facility],on 10/20/2010. Forwarded  7pages to Dr. [name] review.

## 2010-10-26 NOTE — Discharge Summary (Signed)
[name], [name]               ACCOUNT NO.:  [id number]  MEDICAL RECORD NO.:  [id number]  LOCATION:  [id number]                         FACILITY:  [facility]  PHYSICIAN:  Geraldine Solar, MD     DATE OF BIRTH:  01-31-1960  DATE OF ADMISSION:  10/03/2010 DATE OF DISCHARGE:                              DISCHARGE SUMMARY   FINAL DISCHARGE DIAGNOSES: 1. Acute exacerbation of bronchitis/asthma. 2. Mild volume overload/pulmonary edema.  Suspected congestive heart     failure. 3. Obstructive sleep apnea, on CPAP. 4. Hypertension. 5. Type 2 diabetes mellitus. 6. Leukocytosis, probably secondary to steroids. 7. Morbid obesity.  RADIOLOGY/IMAGING STUDIES:  Chest x-ray, October 03, 2010 showed heart size upper normal limits to mildly enlarged with central vascular congestion.  Mild interstitial prominence without focal consolidation.  CONSULTATIONS:  None.  BRIEF ADMITTING HISTORY:  Please refer to H and P for more details.  On summary, Tiffany [name] is a 50 year old obese woman with history of bronchitis/asthma/emphysema, on home O2, history of hypertension, diabetes, CVA came into the ER on October 04, 2010 with chief complaint of shortness of breath associated with cough productive of thick sputum which she described as white and green.  She was also complaining of wheezing for a couple of days prior to admission.  HOSPITAL COURSE: 1. Shortness of breath thought to be secondary to exacerbation of     acute bronchitis versus asthma exacerbation.  The patient was     treated with nebulizer treatments, oxygen, and Solu-Medrol.  She     was also started on IV antibiotics with Zithromax that was later     changed to IV Avelox and then p.o. Avelox.  Her Solu-Medrol dose     was tapered down and changed to prednisone taper.  The patient     showed significant improvement with the above the approach and she     will be discharged on prednisone taper on Avelox and to continue on     her  nebulizer treatment. 2. Suspected congestive heart failure.  The patient had a pro-BNP of     176.5.  Her chest x-ray showed mild vascular congestion and mild     cardiac enlargement.  The patient was given IV Lasix with     improvement in her symptoms.  I offered her a 2-D echocardiogram to     be done during this hospitalization, however, she declined and     preferred to do it as an outpatient.  She was on Lasix as an     outpatient as well and that will be continued on discharge. 3. Obstructive sleep apnea.  The patient was continued on CPAP at     night time to continue at home as per her PCP. 4. Hypertension, was uncontrolled.  The patient was continued on her     lisinopril and clonidine was added with improvement in her blood     pressure. 5. Diabetes mellitus type 2, controlled.  Her hemoglobin A1c was 5.8.     The patient's metformin was held during this hospitalization.  She     was covered with insulin sliding scale.  She will be discharged on  metformin. 6. Leukocytosis without fever.  The patient's white blood count jumped     to 17 after she was given Solu-Medrol, that most likely reflecting     steroid effect.  However, it came down to 13.4 with tapering down     the steroid Medrol and she will be discharged on prednisone and she     is also covered with antibiotic anyway for the bronchitis.     However, my suspicion is that her leukocytosis is secondary to     steroids. 7. Mental illness.  The patient was continued on her outpatient     medications.  The patient to continue with her psychiatrist and PCP     on discharge.  DISCHARGE MEDICATIONS: 1. Clonidine 0.2 mg p.o. twice daily. 2. Avelox 400 mg p.o. daily for 3 more days. 3. Prednisone taper 60 mg for 3 days, then 40 mg p.o. for 3 days, 20     mg p.o. for 3 days, then 10 mg p.o. for 3 days, then to stop. 4. Atorvastatin 20 mg p.o. at bedtime. 5. Bupropion XL 150 mg 3 tablets p.o. daily. 6. Cyclobenzaprine 10  mg p.o. 3 times a day. 7. Cymbalta 60 mg 1 capsule p.o. twice daily. 8. Fluticasone nasal spray, 1 spray nasally twice daily. 9. Lasix 20 mg p.o. b.i.d. 10.Gabapentin 300 mg 1 capsule p.o. twice daily. 11.Lisinopril 20 mg p.o. daily. 12.Loratadine 10 mg p.o. daily. 13.Metformin 1000 mg p.o. daily. 14.Singulair 10 mg p.o. daily. 15.Naproxen 500 mg p.o. twice daily with meals. 16.Omeprazole 40 mg 1 capsule p.o. daily. 17.Oxybutynin 5 mg p.o. 3 times a day. 18.Potassium chloride 20 mEq p.o. daily. 19.Proventil inhalation 2 puffs inhaled q.4 h. p.r.n. 20.Ranitidine 300 mg p.o. twice daily. 21.Symbicort 2 puffs inhaled twice daily. 22.Tramadol 50 mg p.o. 3 times a day. 23.Valtrex 500 mg 1 tablet p.o. twice daily as instructed by her PCP,     as per the patient she had been on this for about a year now.     However, the patient was advised to follow with her PCP to     determine the duration of therapy. 24.Venlafaxine 75 mg p.o. daily. 25.Verapamil SR 240 mg 1 tablet p.o. daily. 26.Vyvanse 70 mg 1 capsule p.o. daily. 27.Ziprasidone 80 mg 1 capsule p.o. twice daily. 28.Zovirax topical 5% one application topically 3 times a day.  DISCHARGE INSTRUCTIONS: 1. The patient was advised to continue the above medications as     prescribed. 2. The patient to follow with her PCP within 1 week to 2 weeks of     discharge.  Since the patient just relocated from [place], case     manager was informed to help her to assign PCP in [place] area.     However, meanwhile, the patient should be followed with her PCP in     [place]. 3. The patient was offered 2-D echocardiogram, however, she declined     that and that can be done as an outpatient as per her PCP's     discretion. 4. The patient to report any worsening of symptoms or any new symptoms     to PCP or come back to the ED if needed. 5. The patient to continue to monitor her blood pressure and CBGs at     home and discuss any further  adjustment of her medicine with her     doctor.  CONDITION ON DISCHARGE:  Improved.          ______________________________ Kern Alberta  Melrose Nakayama, MD     SA/MEDQ  D:  10/06/2010  T:  10/06/2010  Job:  [id number]  cc:   Clyda Hurdle C. Manuela Neptune, M.D.  Electronically Signed by Shellee Milo MD on 10/26/2010 09:07:55 PM

## 2010-11-18 ENCOUNTER — Institutional Professional Consult (permissible substitution): Payer: Medicaid Other | Admitting: Internal Medicine

## 2010-11-30 ENCOUNTER — Ambulatory Visit (HOSPITAL_COMMUNITY): Payer: Medicaid Other | Admitting: Psychiatry

## 2011-01-15 ENCOUNTER — Emergency Department (HOSPITAL_COMMUNITY)
Admission: EM | Admit: 2011-01-15 | Discharge: 2011-01-15 | Disposition: A | Discharge status: Home / Self Care | Payer: Medicaid Other | Attending: Emergency Medicine | Admitting: Emergency Medicine

## 2011-01-15 ENCOUNTER — Emergency Department (HOSPITAL_COMMUNITY): Payer: Medicaid Other

## 2011-01-15 ENCOUNTER — Encounter (HOSPITAL_COMMUNITY): Payer: Self-pay

## 2011-01-15 DIAGNOSIS — E119 Type 2 diabetes mellitus without complications: Secondary | ICD-10-CM | POA: Insufficient documentation

## 2011-01-15 DIAGNOSIS — J438 Other emphysema: Secondary | ICD-10-CM | POA: Insufficient documentation

## 2011-01-15 DIAGNOSIS — R042 Hemoptysis: Secondary | ICD-10-CM | POA: Insufficient documentation

## 2011-01-15 DIAGNOSIS — E7800 Pure hypercholesterolemia, unspecified: Secondary | ICD-10-CM | POA: Insufficient documentation

## 2011-01-15 DIAGNOSIS — Z8679 Personal history of other diseases of the circulatory system: Secondary | ICD-10-CM | POA: Insufficient documentation

## 2011-01-15 DIAGNOSIS — J029 Acute pharyngitis, unspecified: Secondary | ICD-10-CM | POA: Insufficient documentation

## 2011-01-15 DIAGNOSIS — I10 Essential (primary) hypertension: Secondary | ICD-10-CM | POA: Insufficient documentation

## 2011-01-15 DIAGNOSIS — R079 Chest pain, unspecified: Secondary | ICD-10-CM | POA: Insufficient documentation

## 2011-01-15 DIAGNOSIS — IMO0001 Reserved for inherently not codable concepts without codable children: Secondary | ICD-10-CM | POA: Insufficient documentation

## 2011-01-15 DIAGNOSIS — J45909 Unspecified asthma, uncomplicated: Secondary | ICD-10-CM | POA: Insufficient documentation

## 2011-01-15 DIAGNOSIS — G4730 Sleep apnea, unspecified: Secondary | ICD-10-CM | POA: Insufficient documentation

## 2011-01-15 DIAGNOSIS — I252 Old myocardial infarction: Secondary | ICD-10-CM | POA: Insufficient documentation

## 2011-01-15 LAB — CBC
HCT: 30.9 % — ABNORMAL LOW (ref 36.0–46.0)
Hemoglobin: 9.6 g/dL — ABNORMAL LOW (ref 12.0–15.0)
MCH: 28.9 pg (ref 26.0–34.0)
MCHC: 31.1 g/dL (ref 30.0–36.0)
MCV: 93.1 fL (ref 78.0–100.0)
Platelets: 299 10*3/uL (ref 150–400)
RBC: 3.32 MIL/uL — ABNORMAL LOW (ref 3.87–5.11)
RDW: 15.4 % (ref 11.5–15.5)
WBC: 8.6 10*3/uL (ref 4.0–10.5)

## 2011-01-15 LAB — BASIC METABOLIC PANEL
BUN: 10 mg/dL (ref 6–23)
CO2: 28 mEq/L (ref 19–32)
Calcium: 9.3 mg/dL (ref 8.4–10.5)
Chloride: 107 mEq/L (ref 96–112)
Creatinine, Ser: 0.85 mg/dL (ref 0.50–1.10)
GFR calc Af Amer: >90 mL/min (ref >90)
GFR calc non Af Amer: 78 mL/min — ABNORMAL LOW (ref >90)
Glucose, Bld: 117 mg/dL — ABNORMAL HIGH (ref 70–99)
Potassium: 4.3 mEq/L (ref 3.5–5.1)
Sodium: 143 mEq/L (ref 135–145)

## 2011-01-15 MED ORDER — SODIUM CHLORIDE 0.9 % IV BOLUS (SEPSIS)
1000.0000 mL | Freq: Once | INTRAVENOUS | Status: AC
  Administered 2011-01-15: 1000 mL via INTRAVENOUS

## 2011-01-15 MED ORDER — ONDANSETRON HCL 4 MG/2ML IJ SOLN
4.0000 mg | Freq: Once | INTRAMUSCULAR | Status: AC
  Administered 2011-01-15: 4 mg via INTRAVENOUS
  Filled 2011-01-15: qty 2

## 2011-01-15 MED ORDER — AZITHROMYCIN 250 MG PO TABS
500.0000 mg | ORAL_TABLET | Freq: Once | ORAL | Status: AC
  Administered 2011-01-15: 500 mg via ORAL
  Filled 2011-01-15: qty 2

## 2011-01-15 MED ORDER — ALBUTEROL SULFATE (5 MG/ML) 0.5% IN NEBU
5.0000 mg | INHALATION_SOLUTION | Freq: Once | RESPIRATORY_TRACT | Status: AC
  Administered 2011-01-15: 5 mg via RESPIRATORY_TRACT
  Filled 2011-01-15: qty 0.5

## 2011-01-15 MED ORDER — AZITHROMYCIN 250 MG PO TABS: 250.0000 mg | ORAL_TABLET | Freq: Every day | ORAL | Status: AC

## 2011-01-15 MED ORDER — PREDNISONE 20 MG PO TABS: 60.0000 mg | ORAL_TABLET | Freq: Every day | ORAL | Status: AC

## 2011-01-15 MED ORDER — PREDNISONE 20 MG PO TABS
60.0000 mg | ORAL_TABLET | Freq: Once | ORAL | Status: AC
  Administered 2011-01-15: 60 mg via ORAL
  Filled 2011-01-15: qty 1

## 2011-01-15 NOTE — ED Provider Notes (Signed)
History     CSN: [id number]  Arrival date & time 01/15/11  0448   First MD Initiated Contact with Patient 01/15/11 [phone]      Chief Complaint  Patient presents with  . Hematemesis  . Chest Pain    (Consider location/radiation/quality/duration/timing/severity/associated sxs/prior treatment) Patient is a 50 y.o. female presenting with cough. The history is provided by the patient.  Cough This is a new problem. Episode onset: About 4-5 days ago. The problem occurs hourly. The problem has been gradually worsening. The cough is productive of blood-tinged sputum. There has been no fever. Associated symptoms include chest pain, sore throat, myalgias and wheezing. Pertinent negatives include no headaches and no shortness of breath. Treatments tried: Using her inhaler with intermittent relief. Risk factors: No known sick contacts or recent travel. [name] not a smoker. Her past medical history is significant for asthma.   some body aches and persistent cough and this morning some blood-tinged sputum. Patient became concerned, and is worried she may have pneumonia and presents for evaluation. Moderate in severity. By the pain is described as "aches" without any chest pain or shortness of breath. No leg pain or swelling. No blood thinners. No black or tarry stools. No blood in stools. No emesis.  Past Medical History  Diagnosis Date  . High blood pressure   . Incontinence   . Anxiety   . Manic depression   . Heart attack   . Diabetes mellitus     class 2  . Asthma   . Emphysema   . High cholesterol   . Stroke   . Sinus trouble   . Seasonal allergies   . Sleep apnea   . Disorder of nervous system   . Myocardial infarct     Past Surgical History  Procedure Date  . Abdominal hysterectomy   . Cesarean section   . Hiatal hernia repair     Family History  Problem Relation Age of Onset  . Cancer Father     prostate  . Heart failure      cousin  . Cancer Mother     lung     History  Substance Use Topics  . Smoking status: Former Research scientist (life sciences)  . Smokeless tobacco: Not on file  . Alcohol Use: No    OB History    Grav Para Term Preterm Abortions TAB SAB Ect Mult Living                  Review of Systems  Constitutional: Negative for fever.  HENT: Positive for congestion, sore throat and sinus pressure. Negative for nosebleeds, neck pain and neck stiffness.   Eyes: Negative for pain.  Respiratory: Positive for cough and wheezing. Negative for shortness of breath.   Cardiovascular: Positive for chest pain.  Gastrointestinal: Negative for abdominal pain and blood in stool.  Genitourinary: Negative for dysuria and flank pain.  Musculoskeletal: Positive for myalgias. Negative for back pain.  Skin: Negative for rash.  Neurological: Negative for headaches.  All other systems reviewed and are negative.    Allergies  Codeine and Sulfa antibiotics  Home Medications   Current Outpatient Rx  Name Route Sig Dispense Refill  . ALBUTEROL SULFATE (2.5 MG/3ML) 0.083% IN NEBU Nebulization Take 2.5 mg by nebulization every 6 (six) hours as needed.      . ATORVASTATIN CALCIUM 20 MG PO TABS Oral Take 20 mg by mouth daily.      . BUDESONIDE-FORMOTEROL FUMARATE 160-4.5 MCG/ACT IN AERO Inhalation Inhale  2 puffs into the lungs 2 (two) times daily.      . BUPROPION HCL ER (SR) 150 MG PO TB12 Oral Take 150 mg by mouth 3 (three) times daily.      [name] CLONAZEPAM 1 MG PO TABS Oral Take 1 mg by mouth 3 (three) times daily as needed. anxiety     . CLONIDINE HCL 0.2 MG PO TABS Oral Take 0.2 mg by mouth 3 (three) times daily.      . CYCLOBENZAPRINE HCL 10 MG PO TABS Oral Take 10 mg by mouth 3 (three) times daily as needed.      . DULOXETINE HCL 60 MG PO CPEP Oral Take 60 mg by mouth 2 (two) times daily.      . FUROSEMIDE 20 MG PO TABS Oral Take 20 mg by mouth daily.      [name] GABAPENTIN 300 MG PO CAPS Oral Take 300 mg by mouth 2 (two) times daily.      [name] LISDEXAMFETAMINE DIMESYLATE  70 MG PO CAPS Oral Take 70 mg by mouth every morning.      [name] LISINOPRIL 20 MG PO TABS Oral Take 20 mg by mouth 2 (two) times daily.      [name] LORATADINE 10 MG PO TABS Oral Take 10 mg by mouth daily.      [name] METFORMIN HCL ER (MOD) 1000 MG PO TB24 Oral Take 1,000 mg by mouth daily with breakfast.      . MONTELUKAST SODIUM 10 MG PO TABS Oral Take 10 mg by mouth at bedtime.      [name] NAPROXEN 500 MG PO TABS Oral Take 500 mg by mouth 2 (two) times daily with a meal.      . OMEPRAZOLE 40 MG PO CPDR Oral Take 40 mg by mouth daily.      . OXYBUTYNIN CHLORIDE 5 MG PO TABS Oral Take 5 mg by mouth 3 (three) times daily.      [name] RANITIDINE HCL 300 MG PO TABS Oral Take 300 mg by mouth 2 (two) times daily.      . TRAMADOL HCL 50 MG PO TABS Oral Take 50 mg by mouth every 6 (six) hours as needed.      [name] VALACYCLOVIR HCL 500 MG PO TABS Oral Take 500 mg by mouth 2 (two) times daily.      . VENLAFAXINE HCL 75 MG PO TABS Oral Take 75 mg by mouth daily.      [name] GLUCOSE BLOOD VI STRP Other 1 each by Other route as needed. Use as instructed     . ACCU-CHEK MULTICLIX LANCETS MISC Other 1 each by Other route as needed. Use as instructed       BP 172/98  Pulse 87  Temp(Src) 98 F (36.7 C) (Oral)  Resp 20  SpO2 94%  Physical Exam  Constitutional: [name] oriented to person, place, and time. She appears well-developed and well-nourished.  HENT:  Head: Normocephalic and atraumatic.  Eyes: Conjunctivae and EOM are normal. Pupils are equal, round, and reactive to light.  Neck: Trachea normal. Neck supple. No thyromegaly present.  Cardiovascular: Normal rate, regular rhythm, S1 normal, S2 normal and normal pulses.     No systolic murmur is present   No diastolic murmur is present  Pulses:      Radial pulses are 2+ on the right side, and 2+ on the left side.  Pulmonary/Chest: She has no rhonchi. She has no rales. She exhibits no tenderness.  Expiratory wheezes with no tachypnea or respiratory distress  Abdominal:  Soft. Normal appearance and bowel sounds are normal. There is no tenderness. There is no CVA tenderness and negative Murphy's sign.  Musculoskeletal:       BLE:s Calves nontender, no cords or erythema, negative Homans sign  Neurological: [name] alert and oriented to person, place, and time. She has normal strength. No cranial nerve deficit or sensory deficit. GCS eye subscore is 4. GCS verbal subscore is 5. GCS motor subscore is 6.  Skin: Skin is warm and dry. No rash noted. [name] not diaphoretic.  Psychiatric: Her speech is normal.       Cooperative and appropriate    ED Course  Procedures (including critical care time)  Results for orders placed during the hospital encounter of 01/15/11  CBC      Component Value Range   WBC 8.6  4.0 - 10.5 (K/uL)   RBC 3.32 (*) 3.87 - 5.11 (MIL/uL)   Hemoglobin 9.6 (*) 12.0 - 15.0 (g/dL)   HCT 30.9 (*) 36.0 - 46.0 (%)   MCV 93.1  78.0 - 100.0 (fL)   MCH 28.9  26.0 - 34.0 (pg)   MCHC 31.1  30.0 - 36.0 (g/dL)   RDW 15.4  11.5 - 15.5 (%)   Platelets 299  150 - 400 (K/uL)  BASIC METABOLIC PANEL      Component Value Range   Sodium 143  135 - 145 (mEq/L)   Potassium 4.3  3.5 - 5.1 (mEq/L)   Chloride 107  96 - 112 (mEq/L)   CO2 28  19 - 32 (mEq/L)   Glucose, Bld 117 (*) 70 - 99 (mg/dL)   BUN 10  6 - 23 (mg/dL)   Creatinine, Ser 0.85  0.50 - 1.10 (mg/dL)   Calcium 9.3  8.4 - 10.5 (mg/dL)   GFR calc non Af Amer 78 (*) >90 (mL/min)   GFR calc Af Amer >90  >90 (mL/min)   Dg Chest 2 View  01/15/2011  *RADIOLOGY REPORT*  Clinical Data: Chest pain and hematemesis  CHEST - 2 VIEW  Comparison: 10/03/2010  Findings: Mild cardiac enlargement with relatively normal pulmonary vascularity.  No focal airspace consolidation or edema.  No blunting of costophrenic angles.  No pneumothorax.  Degenerative changes in the spine.  No acute changes since previous study.  IMPRESSION: Cardiac enlargement.  No evidence of active pulmonary disease.  Original Report  Authenticated By: Neale Burly, M.D.    Albuterol and prednisone provided for wheezing and asthma history. Chest x-ray obtained and reviewed no pneumonia. Oxygen saturation 94% on room air is adequate   MDM   Clinical bronchitis with multiple comorbidities. Plan Z-Pak and close primary care followup. Five-day course of prednisone prescription provided. Patient has albuterol and asthma medications at home.        Teressa Lower, MD 01/15/11 [phone]

## 2011-01-15 NOTE — ED Notes (Signed)
Patient reports coughing up blood and vomiting states "for a long time" denies diarrhea

## 2011-01-15 NOTE — ED Notes (Addendum)
Pt c/o vomiting blood and having chest pain. Pt states vomit at first had clots of blood and then it became fresh red blood. Pt states she has also been coughing up blood. Pt states that when she has had pneumonia in the past that she has also vomited blood.  Pt states chest pain began on Monday and it feels like a tightening feeling and like "a mass is on my chest."

## 2011-02-02 ENCOUNTER — Ambulatory Visit (INDEPENDENT_AMBULATORY_CARE_PROVIDER_SITE_OTHER): Payer: Medicaid Other | Admitting: Emergency Medicine

## 2011-02-02 ENCOUNTER — Encounter: Payer: Self-pay | Admitting: Emergency Medicine

## 2011-02-02 DIAGNOSIS — G4733 Obstructive sleep apnea (adult) (pediatric): Secondary | ICD-10-CM

## 2011-02-02 DIAGNOSIS — J45909 Unspecified asthma, uncomplicated: Secondary | ICD-10-CM

## 2011-02-02 DIAGNOSIS — Z9989 Dependence on other enabling machines and devices: Secondary | ICD-10-CM

## 2011-02-02 NOTE — Assessment & Plan Note (Signed)
Continue Symbicort, singulair, proventil

## 2011-02-02 NOTE — Assessment & Plan Note (Signed)
Continue CPAP.  

## 2011-02-02 NOTE — Patient Instructions (Signed)
Please continue your Symbicort twice a day Continue singulair daily Use proventil 2 puffs when needed for shortness of breath Follow with Dr Lamonte Sakai in 3 months or sooner if you have any problems.

## 2011-02-02 NOTE — Progress Notes (Signed)
  Subjective:    Patient ID: Tiffany [name], female    DOB: 1960/05/21, 51 y.o.   MRN: [id number] HPI 51 yo never smoker, hx CAD/MI, HTN, DM, hyperlipidemia, allergic rhinitis, OSA (CPAP, Advanced). Dx with asthma as a child, started on O2 in 2000. Uses it prn. Tells me that she was rx for CAP with hemoptysis Zigmund Daniel) in 2009.  Has been on Singulair and Symbicort. Also uses ProAir prn, often every 4 hours. Last flare  - 1 week ago was in [facility] for exacerbation. Last PFT were 2 yrs ago in [place].   ROV 02/02/11 -- f/u for asthma, OSA. She presents today after developing flu-like URI sx for last 3 weeks. She has taken her Symbicort thru the illness. She used Nyquil with some relief. [name] still having cough prod of yellow sputum. Wears her CPAP every night.      Objective:   Physical Exam  Gen: Obese woman, in no distress,  Slightly depressed affect  ENT: No lesions,  mouth clear,  oropharynx dry, no postnasal drip  Neck: No JVD, no TMG, no carotid bruits, hoarse voice, stridor on a forced exp  Lungs: No use of accessory muscles, no dullness to percussion, clear without rales or rhonchi  Cardiovascular: RRR, heart sounds normal, no murmur or gallops, no peripheral edema  Musculoskeletal: No deformities, no cyanosis or clubbing  Neuro: alert, non focal  Skin: Warm, no lesions or rashes     Assessment & Plan:  OSA on CPAP Continue CPAP  Asthma Continue Symbicort, singulair, proventil

## 2011-04-08 ENCOUNTER — Emergency Department (HOSPITAL_COMMUNITY)
Admission: EM | Admit: 2011-04-08 | Discharge: 2011-04-08 | Disposition: A | Discharge status: Home / Self Care | Payer: Medicaid Other | Source: Home / Self Care | Attending: Emergency Medicine | Admitting: Emergency Medicine

## 2011-04-08 ENCOUNTER — Encounter (HOSPITAL_COMMUNITY): Payer: Self-pay

## 2011-04-08 DIAGNOSIS — M549 Dorsalgia, unspecified: Secondary | ICD-10-CM

## 2011-04-08 LAB — POCT URINALYSIS DIP (DEVICE)
Bilirubin Urine: NEGATIVE
Glucose, UA: NEGATIVE mg/dL
Hgb urine dipstick: NEGATIVE
Leukocytes, UA: NEGATIVE
Nitrite: NEGATIVE
Protein, ur: NEGATIVE mg/dL
Specific Gravity, Urine: 1.025 (ref 1.005–1.030)
Urobilinogen, UA: 0.2 mg/dL (ref 0.0–1.0)
pH: 5.5 (ref 5.0–8.0)

## 2011-04-08 LAB — POCT I-STAT, CHEM 8
BUN: 13 mg/dL (ref 6–23)
Calcium, Ion: 1.22 mmol/L (ref 1.12–1.32)
Chloride: 105 mEq/L (ref 96–112)
Creatinine, Ser: 1.1 mg/dL (ref 0.50–1.10)
Glucose, Bld: 101 mg/dL — ABNORMAL HIGH (ref 70–99)
HCT: 37 % (ref 36.0–46.0)
Hemoglobin: 12.6 g/dL (ref 12.0–15.0)
Potassium: 3.9 mEq/L (ref 3.5–5.1)
Sodium: 143 mEq/L (ref 135–145)
TCO2: 27 mmol/L (ref 0–100)

## 2011-04-08 LAB — GLUCOSE, CAPILLARY: Glucose-Capillary: 126 mg/dL — ABNORMAL HIGH (ref 70–99)

## 2011-04-08 MED ORDER — MELOXICAM 7.5 MG PO TABS: 7.5000 mg | ORAL_TABLET | Freq: Every day | ORAL | Status: AC

## 2011-04-08 NOTE — Discharge Instructions (Signed)
As discussed during your office visit today your urine sample in your lab work to noted no kidney problems and no infections. Your left-sided lower back pain seemed to be muscular you can take this medicine as long as you don't take any other medicines over-the-counter and discontinue your naproxen. For discomfort pain. You've mention that you've are uncomfortable with your current providers and that you are interested in establishing care with another primary care doctor in the area I have encouraged you to call your primary care doctor's office for a closure visit so they can arrange for you to be seen by a new primary care Dr. and also obtain all your records for better continuity of care

## 2011-04-08 NOTE — ED Provider Notes (Signed)
History     CSN: [id number]  Arrival date & time 04/08/11  19   First MD Initiated Contact with Patient 04/08/11 1638      Chief Complaint  Patient presents with  . Urinary Retention    (Consider location/radiation/quality/duration/timing/severity/associated sxs/prior treatment) HPI Comments: Patient presents to the urgent care complaining that she has been having some troubles urinating she was told by her doctor that her doctor needed to monitor her kidney function has per her report he had a lab work that to notice some type of kidney problem. (Patient is unable to elaborate the details)  As per patient she has been drinking a lot of fluids but is not urinating a lot has urinated 2 times today perhaps. She also complains of for several days should he been having this left lower back pain that she attributes to be coming from her kidneys.  Patient has no fevers, no dominant pain, no nausea or vomiting,.  Patient goes on to describe how her relationship with her primary care doctor and other specialist in the chart area have not been to her liking in that she is willing to change her care to a doctor in [place].  The history is provided by the patient.    Past Medical History  Diagnosis Date  . High blood pressure   . Incontinence   . Anxiety   . Manic depression   . Heart attack   . Diabetes mellitus     class 2  . Asthma   . Emphysema   . High cholesterol   . Stroke   . Sinus trouble   . Seasonal allergies   . Sleep apnea   . Disorder of nervous system   . Myocardial infarct     Past Surgical History  Procedure Date  . Abdominal hysterectomy   . Cesarean section   . Hiatal hernia repair     Family History  Problem Relation Age of Onset  . Cancer Father     prostate  . Heart failure      cousin  . Cancer Mother     lung    History  Substance Use Topics  . Smoking status: Former Research scientist (life sciences)  . Smokeless tobacco: Not on file  . Alcohol Use: No     OB History    Grav Para Term Preterm Abortions TAB SAB Ect Mult Living                  Review of Systems  Constitutional: Negative for fever, chills and appetite change.  Eyes: Negative for pain.  Respiratory: Negative for cough.   Cardiovascular: Negative for chest pain.  Gastrointestinal: Negative for nausea, vomiting and abdominal pain.  Genitourinary: Positive for decreased urine volume and difficulty urinating. Negative for dysuria, urgency, frequency, flank pain, vaginal bleeding, vaginal discharge, vaginal pain and pelvic pain.  Skin: Negative for rash.    Allergies  Codeine and Sulfa antibiotics  Home Medications   Current Outpatient Rx  Name Route Sig Dispense Refill  . ALBUTEROL SULFATE (2.5 MG/3ML) 0.083% IN NEBU Nebulization Take 2.5 mg by nebulization every 6 (six) hours as needed.      . ATORVASTATIN CALCIUM 20 MG PO TABS Oral Take 20 mg by mouth daily.      . BUDESONIDE-FORMOTEROL FUMARATE 160-4.5 MCG/ACT IN AERO Inhalation Inhale 2 puffs into the lungs 2 (two) times daily.      . BUPROPION HCL ER (SR) 150 MG PO TB12 Oral Take 150 mg by  mouth 3 (three) times daily.      [name] CLONAZEPAM 1 MG PO TABS Oral Take 1 mg by mouth 3 (three) times daily as needed. anxiety     . CLONIDINE HCL 0.2 MG PO TABS Oral Take 0.2 mg by mouth 3 (three) times daily.      . CYCLOBENZAPRINE HCL 10 MG PO TABS Oral Take 10 mg by mouth 3 (three) times daily as needed.      . DULOXETINE HCL 60 MG PO CPEP Oral Take 60 mg by mouth 2 (two) times daily.      . FUROSEMIDE 20 MG PO TABS Oral Take 20 mg by mouth daily.      [name] GABAPENTIN 300 MG PO CAPS Oral Take 300 mg by mouth 2 (two) times daily.      [name] GLUCOSE BLOOD VI STRP Other 1 each by Other route as needed. Use as instructed     . ACCU-CHEK MULTICLIX LANCETS MISC Other 1 each by Other route as needed. Use as instructed     . LISDEXAMFETAMINE DIMESYLATE 70 MG PO CAPS Oral Take 70 mg by mouth every morning.      [name] LISINOPRIL 20 MG PO TABS  Oral Take 20 mg by mouth 2 (two) times daily.      [name] LORATADINE 10 MG PO TABS Oral Take 10 mg by mouth daily.      [name] METFORMIN HCL ER (MOD) 1000 MG PO TB24 Oral Take 1,000 mg by mouth daily with breakfast.      . MONTELUKAST SODIUM 10 MG PO TABS Oral Take 10 mg by mouth at bedtime.      [name] NAPROXEN 500 MG PO TABS Oral Take 500 mg by mouth 2 (two) times daily with a meal.      . OMEPRAZOLE 40 MG PO CPDR Oral Take 40 mg by mouth daily.      . OXYBUTYNIN CHLORIDE 5 MG PO TABS Oral Take 5 mg by mouth 3 (three) times daily.      [name] RANITIDINE HCL 300 MG PO TABS Oral Take 300 mg by mouth 2 (two) times daily.      . TRAMADOL HCL 50 MG PO TABS Oral Take 50 mg by mouth every 6 (six) hours as needed.      [name] VALACYCLOVIR HCL 500 MG PO TABS Oral Take 500 mg by mouth 2 (two) times daily.      . VENLAFAXINE HCL 75 MG PO TABS Oral Take 75 mg by mouth daily.        BP 131/79  Pulse 92  Temp(Src) 98.2 F (36.8 C) (Oral)  Resp 23  SpO2 100%  Physical Exam  Constitutional:  Non-toxic appearance. She does not have a sickly appearance. She does not appear ill. No distress.  Cardiovascular: Normal rate and regular rhythm.   Pulmonary/Chest: Effort normal and breath sounds normal.  Abdominal: Soft. Normal appearance. There is no tenderness.  Neurological: [name] alert.  Skin: No rash noted. [name] not diaphoretic.       ED Course  Procedures (including critical care time)  Labs Reviewed  POCT URINALYSIS DIP (DEVICE) - Abnormal; Notable for the following:    Ketones, ur TRACE (*)    All other components within normal limits  GLUCOSE, CAPILLARY - Abnormal; Notable for the following:    Glucose-Capillary 126 (*)    All other components within normal limits  POCT I-STAT, CHEM 8 - Abnormal; Notable for the following:    Glucose, Bld 101 (*)  All other components within normal limits   No results found.   No diagnosis found.    MDM  Patient presents urgent care complaining of ongoing  left-sided lower back pain and concerns about her kidney function she also has several concerns about her current care and primary care Dr. In [place], "they are all quacks". Electrolytes in creatinine were within normal values. Exam was consistent with a left lower lumbar strain or sprain patient is morbidly obese. Urine sample was obtained without difficulty patient is describing problems urinating urine is not indicative of any infection and no hematuria was seen to explain pigmentation change urine was seen macroscopically by myself did not notice any dark character to it     Rosana Hoes, MD 04/08/11 1919

## 2011-04-08 NOTE — ED Notes (Signed)
Patient states that she noticed that she did not urinate only once or twice yesterday. Patient stated that she was drinking a lot of fluids yesterday and was unable to urinate. Patient stated that her urine was a dark tea color for a month. Denies any burning during urination. Patient also stated that she has had multiple syncope episodes over the past year. Patient stated that she has left side flank pain moving to front. Pain 8 on scale of (1-10).

## 2011-04-23 ENCOUNTER — Emergency Department (HOSPITAL_COMMUNITY)
Admission: EM | Admit: 2011-04-23 | Discharge: 2011-04-23 | Disposition: A | Discharge status: Home / Self Care | Payer: Medicaid Other | Source: Home / Self Care | Attending: Family Medicine | Admitting: Family Medicine

## 2011-04-23 ENCOUNTER — Other Ambulatory Visit: Payer: Self-pay

## 2011-04-23 ENCOUNTER — Encounter (HOSPITAL_COMMUNITY): Payer: Self-pay | Admitting: *Deleted

## 2011-04-23 ENCOUNTER — Emergency Department (HOSPITAL_COMMUNITY)
Admission: EM | Admit: 2011-04-23 | Discharge: 2011-04-23 | Disposition: A | Discharge status: Home / Self Care | Payer: Medicaid Other | Attending: Emergency Medicine | Admitting: Emergency Medicine

## 2011-04-23 DIAGNOSIS — R55 Syncope and collapse: Secondary | ICD-10-CM

## 2011-04-23 DIAGNOSIS — S0990XA Unspecified injury of head, initial encounter: Secondary | ICD-10-CM

## 2011-04-23 DIAGNOSIS — S0083XA Contusion of other part of head, initial encounter: Secondary | ICD-10-CM

## 2011-04-23 DIAGNOSIS — S1093XA Contusion of unspecified part of neck, initial encounter: Secondary | ICD-10-CM

## 2011-04-23 DIAGNOSIS — E119 Type 2 diabetes mellitus without complications: Secondary | ICD-10-CM | POA: Insufficient documentation

## 2011-04-23 DIAGNOSIS — W07XXXA Fall from chair, initial encounter: Secondary | ICD-10-CM | POA: Insufficient documentation

## 2011-04-23 DIAGNOSIS — S0003XA Contusion of scalp, initial encounter: Secondary | ICD-10-CM

## 2011-04-23 NOTE — ED Notes (Signed)
PT STATES SHE FELL 2 DAYS AGO FROM CHAIR WHILE SLEEPING, DIDN'T WAKE UNTIL SHE HIT THE FLOOR, CONTUSION TO LEFT CHEEK, AND LEFT EYE AREA. STATES PAIN AND BRUISING HAS BECOME WORSE.

## 2011-04-23 NOTE — ED Notes (Signed)
Pt feel 2 days ago when her feet got twisted up.  Pt sts this time she was asleep in a chair and she fell on the floor and hit her left side of the face and heard something in her face.  Pt states incresased bruising to left face with swelling reports throbbing.  Pt is diabetic.  Pt sts she had a syncope event this time when she fell.

## 2011-04-23 NOTE — ED Provider Notes (Signed)
History     CSN: [id number]  Arrival date & time 04/23/11  1613   First MD Initiated Contact with Patient 04/23/11 1616      Chief Complaint  Patient presents with  . Fall    (Consider location/radiation/quality/duration/timing/severity/associated sxs/prior treatment) Patient is a 51 y.o. female presenting with fall. The history is provided by the patient.  Fall The accident occurred 2 days ago. The fall occurred in unknown circumstances. She fell from an unknown height. There was no blood loss. The point of impact was the head. The pain is present in the head. There was no drug use involved in the accident. There was no alcohol use involved in the accident.   Patient reports falling Friday. She reports going down some steps. She reports unable to say whether she blacked out, had a seizure, or what particular etiology caused her fall. She states she is supposed to be seeing a neurologist because of that and she continues to use her walker is a cane for better support. She reports increased swelling of the face bruising. She went to the ED because of bruising swelling around the left eye and face and states that the swelling got worse while she was in the ER. She states she waited for about 11-4 and got upset because she felt they did not take her complaints serious enough and came here to be evaluated.      here to be evaluatedPatient Past Medical History  Diagnosis Date  . High blood pressure   . Incontinence   . Anxiety   . Manic depression   . Heart attack   . Diabetes mellitus     class 2  . Asthma   . Emphysema   . High cholesterol   . Stroke   . Sinus trouble   . Seasonal allergies   . Sleep apnea   . Disorder of nervous system   . Myocardial infarct     Past Surgical History  Procedure Date  . Abdominal hysterectomy   . Cesarean section   . Hiatal hernia repair     Family History  Problem Relation Age of Onset  . Cancer Father     prostate  . Heart failure       cousin  . Cancer Mother     lung    History  Substance Use Topics  . Smoking status: Former Research scientist (life sciences)  . Smokeless tobacco: Not on file  . Alcohol Use: No    OB History    Grav Para Term Preterm Abortions TAB SAB Ect Mult Living                  Review of Systems  HENT: Positive for facial swelling and neck pain.   All other systems reviewed and are negative.    Allergies  Codeine and Sulfa antibiotics  Home Medications   Current Outpatient Rx  Name Route Sig Dispense Refill  . ALBUTEROL SULFATE (2.5 MG/3ML) 0.083% IN NEBU Nebulization Take 2.5 mg by nebulization every 6 (six) hours as needed. As needed for shortness of breath.    . ATORVASTATIN CALCIUM 40 MG PO TABS Oral Take 40 mg by mouth daily.    . BUDESONIDE-FORMOTEROL FUMARATE 160-4.5 MCG/ACT IN AERO Inhalation Inhale 2 puffs into the lungs 2 (two) times daily.      . BUPROPION HCL ER (SR) 150 MG PO TB12 Oral Take 150 mg by mouth 2 (two) times daily.     [name] CLONAZEPAM 1 MG PO  TABS Oral Take 1 mg by mouth 3 (three) times daily as needed. anxiety     . CLONIDINE HCL 0.2 MG PO TABS Oral Take 0.2 mg by mouth 3 (three) times daily.      . CYCLOBENZAPRINE HCL 10 MG PO TABS Oral Take 10 mg by mouth 3 (three) times daily as needed. Neck/shoulder pain.    . DULOXETINE HCL 60 MG PO CPEP Oral Take 60 mg by mouth 2 (two) times daily.      . FUROSEMIDE 40 MG PO TABS Oral Take 40 mg by mouth daily.    [name] GABAPENTIN 300 MG PO CAPS Oral Take 300 mg by mouth 3 (three) times daily.     [name] GLUCOSE BLOOD VI STRP Other 1 each by Other route as needed. Use as instructed     . ACCU-CHEK MULTICLIX LANCETS MISC Other 1 each by Other route as needed. Use as instructed     . LISDEXAMFETAMINE DIMESYLATE 70 MG PO CAPS Oral Take 70 mg by mouth every morning.      [name] LISINOPRIL 20 MG PO TABS Oral Take 20 mg by mouth 2 (two) times daily.      [name] LORATADINE 10 MG PO TABS Oral Take 10 mg by mouth daily.      . MELOXICAM 7.5 MG PO TABS Oral Take 1  tablet (7.5 mg total) by mouth daily. 14 tablet 0  . METFORMIN HCL ER (MOD) 1000 MG PO TB24 Oral Take 1,000 mg by mouth daily with breakfast.      . MONTELUKAST SODIUM 10 MG PO TABS Oral Take 10 mg by mouth at bedtime.      . OMEPRAZOLE 40 MG PO CPDR Oral Take 40 mg by mouth daily.      . OXYBUTYNIN CHLORIDE 5 MG PO TABS Oral Take 5 mg by mouth 3 (three) times daily.      [name] RANITIDINE HCL 300 MG PO TABS Oral Take 300 mg by mouth 2 (two) times daily.      . TRAMADOL HCL 50 MG PO TABS Oral Take 50 mg by mouth 3 (three) times daily.     [name] VALACYCLOVIR HCL 500 MG PO TABS Oral Take 500 mg by mouth 2 (two) times daily.      . VENLAFAXINE HCL 75 MG PO TABS Oral Take 75 mg by mouth daily.        BP 187/103  Pulse 90  Temp(Src) 99 F (37.2 C) (Oral)  Resp 18  SpO2 96%  Physical Exam  Constitutional: [name] oriented to person, place, and time. She appears well-developed and well-nourished.       Obese black female  HENT:  Head: Normocephalic. Head is with contusion and with left periorbital erythema.    Right Ear: External ear and ear canal normal.  Left Ear: External ear and ear canal normal.       There is marked tenderness along the left side of the face under the left eye and along the left cheek.  Neck: Neck supple.  Neurological: [name] alert and oriented to person, place, and time.  Skin: Skin is warm. There is erythema.  Psychiatric: She has a normal mood and affect. Her behavior is normal.    ED Course  Procedures (including critical care time) Multiple facial and left orbital swelling/contusion Syncopal episode Patient has been informed she needs CT scan head and face for the contusion of the face and syncopal episode she experienced. Patient refuses to go back to  [facility]. She states she's not better she will go to [facility]. She requests pain medication which I declined because of refusal to get CT scan in Patient will be leaving [place]          Frederich Cha,  MD 04/23/11 [phone]

## 2011-04-24 ENCOUNTER — Emergency Department (HOSPITAL_COMMUNITY): Payer: Medicaid Other

## 2011-04-24 ENCOUNTER — Other Ambulatory Visit: Payer: Self-pay

## 2011-04-24 ENCOUNTER — Encounter (HOSPITAL_COMMUNITY): Payer: Self-pay | Admitting: Emergency Medicine

## 2011-04-24 ENCOUNTER — Observation Stay (HOSPITAL_COMMUNITY)
Admission: EM | Admit: 2011-04-24 | Discharge: 2011-04-27 | Disposition: A | Discharge status: Home / Self Care | Payer: Medicaid Other | Attending: Internal Medicine | Admitting: Internal Medicine

## 2011-04-24 DIAGNOSIS — D72829 Elevated white blood cell count, unspecified: Secondary | ICD-10-CM

## 2011-04-24 DIAGNOSIS — J438 Other emphysema: Secondary | ICD-10-CM | POA: Insufficient documentation

## 2011-04-24 DIAGNOSIS — R0789 Other chest pain: Secondary | ICD-10-CM | POA: Insufficient documentation

## 2011-04-24 DIAGNOSIS — E1142 Type 2 diabetes mellitus with diabetic polyneuropathy: Secondary | ICD-10-CM | POA: Insufficient documentation

## 2011-04-24 DIAGNOSIS — W19XXXA Unspecified fall, initial encounter: Secondary | ICD-10-CM

## 2011-04-24 DIAGNOSIS — D649 Anemia, unspecified: Secondary | ICD-10-CM

## 2011-04-24 DIAGNOSIS — Z8673 Personal history of transient ischemic attack (TIA), and cerebral infarction without residual deficits: Secondary | ICD-10-CM | POA: Insufficient documentation

## 2011-04-24 DIAGNOSIS — J45909 Unspecified asthma, uncomplicated: Secondary | ICD-10-CM | POA: Diagnosis present

## 2011-04-24 DIAGNOSIS — I10 Essential (primary) hypertension: Secondary | ICD-10-CM

## 2011-04-24 DIAGNOSIS — E1140 Type 2 diabetes mellitus with diabetic neuropathy, unspecified: Secondary | ICD-10-CM

## 2011-04-24 DIAGNOSIS — Z79899 Other long term (current) drug therapy: Secondary | ICD-10-CM | POA: Insufficient documentation

## 2011-04-24 DIAGNOSIS — I252 Old myocardial infarction: Secondary | ICD-10-CM | POA: Insufficient documentation

## 2011-04-24 DIAGNOSIS — F418 Other specified anxiety disorders: Secondary | ICD-10-CM

## 2011-04-24 DIAGNOSIS — D500 Iron deficiency anemia secondary to blood loss (chronic): Secondary | ICD-10-CM

## 2011-04-24 DIAGNOSIS — E119 Type 2 diabetes mellitus without complications: Secondary | ICD-10-CM | POA: Diagnosis present

## 2011-04-24 DIAGNOSIS — E785 Hyperlipidemia, unspecified: Secondary | ICD-10-CM

## 2011-04-24 DIAGNOSIS — F341 Dysthymic disorder: Secondary | ICD-10-CM | POA: Insufficient documentation

## 2011-04-24 DIAGNOSIS — E1149 Type 2 diabetes mellitus with other diabetic neurological complication: Secondary | ICD-10-CM | POA: Insufficient documentation

## 2011-04-24 DIAGNOSIS — E669 Obesity, unspecified: Secondary | ICD-10-CM

## 2011-04-24 DIAGNOSIS — G4733 Obstructive sleep apnea (adult) (pediatric): Secondary | ICD-10-CM

## 2011-04-24 DIAGNOSIS — R296 Repeated falls: Secondary | ICD-10-CM

## 2011-04-24 DIAGNOSIS — R55 Syncope and collapse: Principal | ICD-10-CM | POA: Diagnosis present

## 2011-04-24 HISTORY — DX: Iron deficiency anemia secondary to blood loss (chronic): D50.0

## 2011-04-24 HISTORY — DX: Type 2 diabetes mellitus with diabetic neuropathy, unspecified: E11.40

## 2011-04-24 HISTORY — DX: Other complications of anesthesia, initial encounter: T88.59XA

## 2011-04-24 HISTORY — DX: Adverse effect of unspecified anesthetic, initial encounter: T41.45XA

## 2011-04-24 HISTORY — DX: Hyperlipidemia, unspecified: E78.5

## 2011-04-24 LAB — POCT I-STAT TROPONIN I: Troponin i, poc: 0 ng/mL (ref 0.00–0.08)

## 2011-04-24 LAB — CBC
HCT: 30.4 % — ABNORMAL LOW (ref 36.0–46.0)
HCT: 31.5 % — ABNORMAL LOW (ref 36.0–46.0)
Hemoglobin: 9.4 g/dL — ABNORMAL LOW (ref 12.0–15.0)
Hemoglobin: 9.8 g/dL — ABNORMAL LOW (ref 12.0–15.0)
MCH: 29.3 pg (ref 26.0–34.0)
MCH: 29.3 pg (ref 26.0–34.0)
MCHC: 30.9 g/dL (ref 30.0–36.0)
MCHC: 31.1 g/dL (ref 30.0–36.0)
MCV: 94.7 fL (ref 78.0–100.0)
MCV: 94 fL (ref 78.0–100.0)
Platelets: 337 10*3/uL (ref 150–400)
Platelets: 356 10*3/uL (ref 150–400)
RBC: 3.21 MIL/uL — ABNORMAL LOW (ref 3.87–5.11)
RBC: 3.35 MIL/uL — ABNORMAL LOW (ref 3.87–5.11)
RDW: 15.6 % — ABNORMAL HIGH (ref 11.5–15.5)
RDW: 15.7 % — ABNORMAL HIGH (ref 11.5–15.5)
WBC: 13 10*3/uL — ABNORMAL HIGH (ref 4.0–10.5)
WBC: 13 10*3/uL — ABNORMAL HIGH (ref 4.0–10.5)

## 2011-04-24 LAB — COMPREHENSIVE METABOLIC PANEL
ALT: 37 U/L — ABNORMAL HIGH (ref 0–35)
ALT: 38 U/L — ABNORMAL HIGH (ref 0–35)
AST: 21 U/L (ref 0–37)
AST: 20 U/L (ref 0–37)
Albumin: 3.1 g/dL — ABNORMAL LOW (ref 3.5–5.2)
Albumin: 3.3 g/dL — ABNORMAL LOW (ref 3.5–5.2)
Alkaline Phosphatase: 153 U/L — ABNORMAL HIGH (ref 39–117)
Alkaline Phosphatase: 163 U/L — ABNORMAL HIGH (ref 39–117)
BUN: 10 mg/dL (ref 6–23)
BUN: 9 mg/dL (ref 6–23)
CO2: 26 mEq/L (ref 19–32)
CO2: 31 mEq/L (ref 19–32)
Calcium: 8.9 mg/dL (ref 8.4–10.5)
Calcium: 9.2 mg/dL (ref 8.4–10.5)
Chloride: 108 mEq/L (ref 96–112)
Chloride: 105 mEq/L (ref 96–112)
Creatinine, Ser: 0.95 mg/dL (ref 0.50–1.10)
Creatinine, Ser: 0.92 mg/dL (ref 0.50–1.10)
GFR calc Af Amer: 79 mL/min — ABNORMAL LOW (ref >90)
GFR calc Af Amer: 82 mL/min — ABNORMAL LOW (ref >90)
GFR calc non Af Amer: 68 mL/min — ABNORMAL LOW (ref >90)
GFR calc non Af Amer: 71 mL/min — ABNORMAL LOW (ref >90)
Glucose, Bld: 116 mg/dL — ABNORMAL HIGH (ref 70–99)
Glucose, Bld: 103 mg/dL — ABNORMAL HIGH (ref 70–99)
Potassium: 4 mEq/L (ref 3.5–5.1)
Potassium: 4.1 mEq/L (ref 3.5–5.1)
Sodium: 142 mEq/L (ref 135–145)
Sodium: 142 mEq/L (ref 135–145)
Total Bilirubin: 0.1 mg/dL — ABNORMAL LOW (ref 0.3–1.2)
Total Bilirubin: 0.2 mg/dL — ABNORMAL LOW (ref 0.3–1.2)
Total Protein: 6 g/dL (ref 6.0–8.3)
Total Protein: 6.4 g/dL (ref 6.0–8.3)

## 2011-04-24 LAB — URINALYSIS, ROUTINE W REFLEX MICROSCOPIC
Bilirubin Urine: NEGATIVE
Glucose, UA: NEGATIVE mg/dL
Hgb urine dipstick: NEGATIVE
Ketones, ur: NEGATIVE mg/dL
Leukocytes, UA: NEGATIVE
Nitrite: NEGATIVE
Protein, ur: NEGATIVE mg/dL
Specific Gravity, Urine: 1.004 — ABNORMAL LOW (ref 1.005–1.030)
Urobilinogen, UA: 0.2 mg/dL (ref 0.0–1.0)
pH: 7 (ref 5.0–8.0)

## 2011-04-24 LAB — DIFFERENTIAL
Basophils Absolute: 0 10*3/uL (ref 0.0–0.1)
Basophils Absolute: 0 10*3/uL (ref 0.0–0.1)
Basophils Relative: 0 % (ref 0–1)
Basophils Relative: 0 % (ref 0–1)
Eosinophils Absolute: 1.6 10*3/uL — ABNORMAL HIGH (ref 0.0–0.7)
Eosinophils Absolute: 1.6 10*3/uL — ABNORMAL HIGH (ref 0.0–0.7)
Eosinophils Relative: 12 % — ABNORMAL HIGH (ref 0–5)
Eosinophils Relative: 12 % — ABNORMAL HIGH (ref 0–5)
Lymphocytes Relative: 21 % (ref 12–46)
Lymphocytes Relative: 24 % (ref 12–46)
Lymphs Abs: 2.7 10*3/uL (ref 0.7–4.0)
Lymphs Abs: 3.2 10*3/uL (ref 0.7–4.0)
Monocytes Absolute: 0.5 10*3/uL (ref 0.1–1.0)
Monocytes Absolute: 0.4 10*3/uL (ref 0.1–1.0)
Monocytes Relative: 4 % (ref 3–12)
Monocytes Relative: 3 % (ref 3–12)
Neutro Abs: 8.2 10*3/uL — ABNORMAL HIGH (ref 1.7–7.7)
Neutro Abs: 7.8 10*3/uL — ABNORMAL HIGH (ref 1.7–7.7)
Neutrophils Relative %: 63 % (ref 43–77)
Neutrophils Relative %: 60 % (ref 43–77)

## 2011-04-24 LAB — APTT: aPTT: 35 seconds (ref 24–37)

## 2011-04-24 LAB — CARDIAC PANEL(CRET KIN+CKTOT+MB+TROPI)
CK, MB: 4.5 ng/mL — ABNORMAL HIGH (ref 0.3–4.0)
Relative Index: 1.7 (ref 0.0–2.5)
Total CK: 272 U/L — ABNORMAL HIGH (ref 7–177)
Troponin I: <0.3 ng/mL (ref <0.30)

## 2011-04-24 LAB — HEMOGLOBIN A1C
Hgb A1c MFr Bld: 5.9 % — ABNORMAL HIGH (ref <5.7)
Mean Plasma Glucose: 123 mg/dL — ABNORMAL HIGH (ref <117)

## 2011-04-24 LAB — PRO B NATRIURETIC PEPTIDE: Pro B Natriuretic peptide (BNP): 159 pg/mL — ABNORMAL HIGH (ref 0–125)

## 2011-04-24 LAB — PROTIME-INR
INR: 1.01 (ref 0.00–1.49)
Prothrombin Time: 13.5 seconds (ref 11.6–15.2)

## 2011-04-24 LAB — TSH: TSH: 1.398 u[IU]/mL (ref 0.350–4.500)

## 2011-04-24 LAB — PHOSPHORUS: Phosphorus: 3.2 mg/dL (ref 2.3–4.6)

## 2011-04-24 LAB — MAGNESIUM: Magnesium: 2 mg/dL (ref 1.5–2.5)

## 2011-04-24 LAB — GLUCOSE, CAPILLARY
Glucose-Capillary: 84 mg/dL (ref 70–99)
Glucose-Capillary: 120 mg/dL — ABNORMAL HIGH (ref 70–99)

## 2011-04-24 MED ORDER — PANTOPRAZOLE SODIUM 40 MG PO TBEC
40.0000 mg | DELAYED_RELEASE_TABLET | Freq: Every day | ORAL | Status: DC
  Administered 2011-04-25 – 2011-04-27 (×3): 40 mg via ORAL
  Filled 2011-04-24 (×5): qty 1

## 2011-04-24 MED ORDER — FLUTICASONE PROPIONATE 50 MCG/ACT NA SUSP
2.0000 | Freq: Every day | NASAL | Status: DC
  Administered 2011-04-24 – 2011-04-27 (×4): 2 via NASAL
  Filled 2011-04-24: qty 16

## 2011-04-24 MED ORDER — IPRATROPIUM BROMIDE 0.02 % IN SOLN
0.5000 mg | Freq: Four times a day (QID) | RESPIRATORY_TRACT | Status: DC
  Administered 2011-04-24: 0.5 mg via RESPIRATORY_TRACT
  Filled 2011-04-24: qty 2.5

## 2011-04-24 MED ORDER — DULOXETINE HCL 60 MG PO CPEP
60.0000 mg | ORAL_CAPSULE | Freq: Two times a day (BID) | ORAL | Status: DC
  Administered 2011-04-24 – 2011-04-27 (×6): 60 mg via ORAL
  Filled 2011-04-24 (×9): qty 1

## 2011-04-24 MED ORDER — LISDEXAMFETAMINE DIMESYLATE 70 MG PO CAPS
70.0000 mg | ORAL_CAPSULE | Freq: Every day | ORAL | Status: DC
  Administered 2011-04-25 – 2011-04-27 (×3): 70 mg via ORAL
  Filled 2011-04-24 (×3): qty 1

## 2011-04-24 MED ORDER — ONDANSETRON HCL 4 MG/2ML IJ SOLN: 4.0000 mg | Freq: Four times a day (QID) | INTRAMUSCULAR | Status: DC | PRN

## 2011-04-24 MED ORDER — VALACYCLOVIR HCL 500 MG PO TABS
500.0000 mg | ORAL_TABLET | Freq: Two times a day (BID) | ORAL | Status: DC
  Administered 2011-04-24 – 2011-04-27 (×6): 500 mg via ORAL
  Filled 2011-04-24 (×9): qty 1

## 2011-04-24 MED ORDER — MONTELUKAST SODIUM 10 MG PO TABS
10.0000 mg | ORAL_TABLET | Freq: Every day | ORAL | Status: DC
  Administered 2011-04-24 – 2011-04-26 (×3): 10 mg via ORAL
  Filled 2011-04-24 (×5): qty 1

## 2011-04-24 MED ORDER — ALBUTEROL SULFATE (5 MG/ML) 0.5% IN NEBU
2.5000 mg | INHALATION_SOLUTION | Freq: Four times a day (QID) | RESPIRATORY_TRACT | Status: DC
  Administered 2011-04-24: 2.5 mg via RESPIRATORY_TRACT
  Filled 2011-04-24: qty 0.5

## 2011-04-24 MED ORDER — INSULIN ASPART 100 UNIT/ML ~~LOC~~ SOLN: 0.0000 [IU] | Freq: Every day | SUBCUTANEOUS | Status: DC

## 2011-04-24 MED ORDER — CLONIDINE HCL 0.2 MG PO TABS
0.2000 mg | ORAL_TABLET | Freq: Three times a day (TID) | ORAL | Status: DC
  Administered 2011-04-24 – 2011-04-27 (×10): 0.2 mg via ORAL
  Filled 2011-04-24 (×14): qty 1

## 2011-04-24 MED ORDER — ACYCLOVIR 5 % EX OINT
1.0000 "application " | TOPICAL_OINTMENT | Freq: Three times a day (TID) | CUTANEOUS | Status: DC
  Administered 2011-04-24 – 2011-04-27 (×8): 1 via TOPICAL
  Filled 2011-04-24: qty 30

## 2011-04-24 MED ORDER — BUPROPION HCL ER (SR) 150 MG PO TB12
150.0000 mg | ORAL_TABLET | Freq: Two times a day (BID) | ORAL | Status: DC
  Administered 2011-04-24 – 2011-04-27 (×6): 150 mg via ORAL
  Filled 2011-04-24 (×9): qty 1

## 2011-04-24 MED ORDER — CYCLOBENZAPRINE HCL 10 MG PO TABS
10.0000 mg | ORAL_TABLET | Freq: Three times a day (TID) | ORAL | Status: DC | PRN
  Administered 2011-04-25 – 2011-04-27 (×4): 10 mg via ORAL
  Filled 2011-04-24 (×6): qty 1

## 2011-04-24 MED ORDER — TRAMADOL HCL 50 MG PO TABS
50.0000 mg | ORAL_TABLET | Freq: Three times a day (TID) | ORAL | Status: DC
  Administered 2011-04-24 – 2011-04-27 (×10): 50 mg via ORAL
  Filled 2011-04-24 (×14): qty 1

## 2011-04-24 MED ORDER — LISINOPRIL 20 MG PO TABS
20.0000 mg | ORAL_TABLET | Freq: Two times a day (BID) | ORAL | Status: DC
  Administered 2011-04-24 – 2011-04-27 (×6): 20 mg via ORAL
  Filled 2011-04-24 (×10): qty 1

## 2011-04-24 MED ORDER — MELOXICAM 7.5 MG PO TABS
7.5000 mg | ORAL_TABLET | Freq: Every day | ORAL | Status: DC
  Administered 2011-04-24 – 2011-04-27 (×4): 7.5 mg via ORAL
  Filled 2011-04-24 (×5): qty 1

## 2011-04-24 MED ORDER — GABAPENTIN 300 MG PO CAPS
300.0000 mg | ORAL_CAPSULE | Freq: Three times a day (TID) | ORAL | Status: DC
  Administered 2011-04-24 – 2011-04-26 (×6): 300 mg via ORAL
  Filled 2011-04-24 (×8): qty 1

## 2011-04-24 MED ORDER — CLONAZEPAM 1 MG PO TABS
1.0000 mg | ORAL_TABLET | Freq: Three times a day (TID) | ORAL | Status: DC | PRN
  Administered 2011-04-25: 1 mg via ORAL
  Filled 2011-04-24: qty 1

## 2011-04-24 MED ORDER — ENOXAPARIN SODIUM 30 MG/0.3ML ~~LOC~~ SOLN
30.0000 mg | SUBCUTANEOUS | Status: DC
  Administered 2011-04-24: 30 mg via SUBCUTANEOUS
  Filled 2011-04-24 (×2): qty 0.3

## 2011-04-24 MED ORDER — SODIUM CHLORIDE 0.9 % IJ SOLN
3.0000 mL | Freq: Two times a day (BID) | INTRAMUSCULAR | Status: DC
  Administered 2011-04-24 – 2011-04-27 (×6): 3 mL via INTRAVENOUS

## 2011-04-24 MED ORDER — HYDRALAZINE HCL 20 MG/ML IJ SOLN
10.0000 mg | Freq: Four times a day (QID) | INTRAMUSCULAR | Status: DC | PRN
  Administered 2011-04-24 – 2011-04-25 (×2): 10 mg via INTRAVENOUS
  Filled 2011-04-24 (×2): qty 1

## 2011-04-24 MED ORDER — ONDANSETRON HCL 4 MG PO TABS: 4.0000 mg | ORAL_TABLET | Freq: Four times a day (QID) | ORAL | Status: DC | PRN

## 2011-04-24 MED ORDER — ATORVASTATIN CALCIUM 40 MG PO TABS
40.0000 mg | ORAL_TABLET | Freq: Every day | ORAL | Status: DC
  Administered 2011-04-24 – 2011-04-27 (×4): 40 mg via ORAL
  Filled 2011-04-24 (×5): qty 1

## 2011-04-24 MED ORDER — OXYBUTYNIN CHLORIDE 5 MG PO TABS
5.0000 mg | ORAL_TABLET | Freq: Three times a day (TID) | ORAL | Status: DC
  Administered 2011-04-24 – 2011-04-27 (×10): 5 mg via ORAL
  Filled 2011-04-24 (×14): qty 1

## 2011-04-24 MED ORDER — VENLAFAXINE HCL 75 MG PO TABS
75.0000 mg | ORAL_TABLET | Freq: Every day | ORAL | Status: DC
  Administered 2011-04-24 – 2011-04-27 (×4): 75 mg via ORAL
  Filled 2011-04-24 (×5): qty 1

## 2011-04-24 MED ORDER — LORATADINE 10 MG PO TABS
10.0000 mg | ORAL_TABLET | Freq: Every day | ORAL | Status: DC
  Administered 2011-04-24 – 2011-04-27 (×4): 10 mg via ORAL
  Filled 2011-04-24 (×5): qty 1

## 2011-04-24 MED ORDER — INSULIN ASPART 100 UNIT/ML ~~LOC~~ SOLN
0.0000 [IU] | Freq: Three times a day (TID) | SUBCUTANEOUS | Status: DC
  Administered 2011-04-25 – 2011-04-26 (×3): 2 [IU] via SUBCUTANEOUS

## 2011-04-24 MED ORDER — FUROSEMIDE 40 MG PO TABS
40.0000 mg | ORAL_TABLET | Freq: Every day | ORAL | Status: DC
  Administered 2011-04-24 – 2011-04-26 (×3): 40 mg via ORAL
  Filled 2011-04-24 (×3): qty 1

## 2011-04-24 MED ORDER — BUDESONIDE-FORMOTEROL FUMARATE 160-4.5 MCG/ACT IN AERO
2.0000 | INHALATION_SPRAY | Freq: Two times a day (BID) | RESPIRATORY_TRACT | Status: DC
  Administered 2011-04-24 – 2011-04-27 (×6): 2 via RESPIRATORY_TRACT
  Filled 2011-04-24: qty 6

## 2011-04-24 NOTE — H&P (Addendum)
PCP:  William Hamburger, MD, MD   DOA:  04/24/2011 12:16 PM  Chief Complaint:  syncope  HPI: 51 year old female with history of htn, syncopal episodes in past, depression/anxiety who presented to ED with complaints of recurrent syncope. Patient has no evidence of tonic clonic seizure and was not put on any AED. [name] on neurontin however for neuropathy. Patient has complaints of chest pain which radiates to left leg and occasionally but not consistently to left arm. Chest pain is mainly in the mid chest area, 7/10 in intensity with no aggravating or alleviating factors. No associated nausea or vomiting, no sweating, no fever or chills, no cough or shortness of breath.  Assessment/Plan  Principal Problem:   *Syncope - perhaps related to multiple medications for anxiety and depression versus absence seizure - rule out cardiac arrhythmia : cycle cardiac enzymes, TSH, A1c - follow up 2 D ECHO - PT evaluation - neurology consult appreciated  Active Problems:  Leukocytosis - unclear etiology; likely reactive - no fever, no obvious source of infection: urinalysis is negative, CXR shows no active cardiopulmonary disease - hold off on antibiotics   OSA on CPAP - continue CPAP at night   Asthma - nebulizer treatments for shortness of breath - Singulair daily - continue symbicort   HTN (hypertension) - continue lisinopril and clonidine   DM (diabetes mellitus) - hold metformin - sliding scale insulin - continue CBG monitoring  Diabetic neuropathy - continue neurontin   Dyslipidemia - continue simvastatin   Anemia - hemoglobin stable since admission   Depression with anxiety - continue home medication regimen  DVT Prophylaxis - lovenox sub Q  Code Status - full code  Education  - test results and diagnostic studies were discussed with patient and pt's family who was present at the bedside - patient and family have verbalized the understanding - questions were  answered at the bedside and contact information was provided for additional questions or concerns    Allergies: Allergies  Allergen Reactions  . Codeine Nausea And Vomiting  . Sulfa Antibiotics Itching    Prior to Admission medications   Medication Sig Start Date End Date Taking? Authorizing Provider  acyclovir ointment (ZOVIRAX) 5 % Apply 1 application topically 3 (three) times daily.   Yes Historical Provider, MD  albuterol (PROVENTIL HFA;VENTOLIN HFA) 108 (90 BASE) MCG/ACT inhaler Inhale 2 puffs into the lungs every 6 (six) hours as needed.   Yes Historical Provider, MD  albuterol (PROVENTIL) (2.5 MG/3ML) 0.083% nebulizer solution Take 2.5 mg by nebulization every 6 (six) hours as needed. As needed for shortness of breath.   Yes Historical Provider, MD  atorvastatin (LIPITOR) 40 MG tablet Take 40 mg by mouth daily.   Yes Historical Provider, MD  budesonide-formoterol (SYMBICORT) 160-4.5 MCG/ACT inhaler Inhale 2 puffs into the lungs 2 (two) times daily.     Yes Historical Provider, MD  buPROPion (WELLBUTRIN SR) 150 MG 12 hr tablet Take 150 mg by mouth 2 (two) times daily.    Yes Historical Provider, MD  clonazePAM (KLONOPIN) 1 MG tablet Take 1 mg by mouth 3 (three) times daily as needed. anxiety    Yes Historical Provider, MD  cloNIDine (CATAPRES) 0.2 MG tablet Take 0.2 mg by mouth 3 (three) times daily.     Yes Historical Provider, MD  cyclobenzaprine (FLEXERIL) 10 MG tablet Take 10 mg by mouth 3 (three) times daily as needed. Neck/shoulder pain.   Yes Historical Provider, MD  DULoxetine (CYMBALTA) 60 MG capsule Take 60  mg by mouth 2 (two) times daily.     Yes Historical Provider, MD  fluticasone (FLONASE) 50 MCG/ACT nasal spray Place 2 sprays into the nose daily.   Yes Historical Provider, MD  furosemide (LASIX) 40 MG tablet Take 40 mg by mouth daily.   Yes Historical Provider, MD  gabapentin (NEURONTIN) 300 MG capsule Take 300 mg by mouth 3 (three) times daily.    Yes Historical  Provider, MD  glucose blood test strip 1 each by Other route as needed. Use as instructed    Yes Historical Provider, MD  Lancets (ACCU-CHEK MULTICLIX) lancets 1 each by Other route as needed. Use as instructed    Yes Historical Provider, MD  lisdexamfetamine (VYVANSE) 70 MG capsule Take 70 mg by mouth every morning.     Yes Historical Provider, MD  lisinopril (PRINIVIL,ZESTRIL) 20 MG tablet Take 20 mg by mouth 2 (two) times daily.     Yes Historical Provider, MD  loratadine (CLARITIN) 10 MG tablet Take 10 mg by mouth daily.     Yes Historical Provider, MD  meloxicam (MOBIC) 7.5 MG tablet Take 1 tablet (7.5 mg total) by mouth daily. 04/08/11 04/07/12 Yes Rosana Hoes, MD  metFORMIN (GLUMETZA) 1000 MG (MOD) 24 hr tablet Take 1,000 mg by mouth daily with breakfast.     Yes Historical Provider, MD  montelukast (SINGULAIR) 10 MG tablet Take 10 mg by mouth at bedtime.     Yes Historical Provider, MD  omeprazole (PRILOSEC) 40 MG capsule Take 40 mg by mouth daily.     Yes Historical Provider, MD  oxybutynin (DITROPAN) 5 MG tablet Take 5 mg by mouth 3 (three) times daily.     Yes Historical Provider, MD  ranitidine (ZANTAC) 300 MG tablet Take 300 mg by mouth 2 (two) times daily.     Yes Historical Provider, MD  traMADol (ULTRAM) 50 MG tablet Take 50 mg by mouth 3 (three) times daily.    Yes Historical Provider, MD  valACYclovir (VALTREX) 500 MG tablet Take 500 mg by mouth 2 (two) times daily.   Yes Historical Provider, MD  venlafaxine (EFFEXOR) 75 MG tablet Take 75 mg by mouth daily.     Yes Historical Provider, MD    Past Medical History  Diagnosis Date  . High blood pressure   . Incontinence   . Anxiety   . Manic depression   . Heart attack   . Diabetes mellitus     class 2  . Asthma   . Emphysema   . High cholesterol   . Stroke   . Sinus trouble   . Seasonal allergies   . Sleep apnea   . Disorder of nervous system   . Myocardial infarct   . Complication of anesthesia     decreased bp,  decreased heart rate    Past Surgical History  Procedure Date  . Abdominal hysterectomy   . Cesarean section   . Hiatal hernia repair     Social History:  reports that she quit smoking about 34 years ago. Her smoking use included Cigarettes. She has never used smokeless tobacco. She reports that she does not drink alcohol or use illicit drugs.  Family History  Problem Relation Age of Onset  . Cancer Father     prostate  . Heart failure      cousin  . Cancer Mother     lung    Review of Systems:  Constitutional: Denies fever, chills, diaphoresis, appetite change and fatigue.  HEENT:  Denies photophobia, eye pain, redness, hearing loss, ear pain, congestion, sore throat, rhinorrhea, sneezing, mouth sores, trouble swallowing, neck pain, neck stiffness and tinnitus.   Respiratory: Denies SOB, DOE, cough, chest tightness,  and wheezing.   Cardiovascular: Denies chest pain, palpitations and leg swelling.  Gastrointestinal: Denies nausea, vomiting, abdominal pain, diarrhea, constipation, blood in stool and abdominal distention.  Genitourinary: Denies dysuria, urgency, frequency, hematuria, flank pain and difficulty urinating.  Musculoskeletal: Denies myalgias, back pain, joint swelling, arthralgias and gait problem.  Skin: Denies pallor, rash and wound.  Neurological: per HPI Hematological: Denies adenopathy. Easy bruising, personal or family bleeding history  Psychiatric/Behavioral: Denies suicidal ideation, mood changes, confusion, nervousness, sleep disturbance and agitation   Physical Exam:  Filed Vitals:   04/24/11 1223  BP: 106/46  Pulse: 83  Temp: 97.7 F (36.5 C)  TempSrc: Oral  Resp: 20  SpO2: 97%    Constitutional: Vital signs reviewed.  Patient is in no acute distress and cooperative with exam. Alert and oriented x3.  Head: Normocephalic and atraumatic Ear: TM normal bilaterally Mouth: no erythema or exudates, MMM Eyes: PERRL, EOMI, conjunctivae normal, No  scleral icterus.  Neck: Supple, Trachea midline normal ROM, No JVD, mass, thyromegaly, or carotid bruit present.  Cardiovascular: RRR, S1 normal, S2 normal, no MRG, pulses symmetric and intact bilaterally Pulmonary/Chest: CTAB, no wheezes, rales, or rhonchi Abdominal: Soft. Non-tender, non-distended, bowel sounds are normal, no masses, organomegaly, or guarding present.  GU: no CVA tenderness Musculoskeletal: No joint deformities, erythema, or stiffness, ROM full and no nontender Ext: (+) edema and no cyanosis, pulses palpable bilaterally (DP and PT) Hematology: no cervical, inginal, or axillary adenopathy.  Neurological: A&O x3, Strenght is normal and symmetric bilaterally, cranial nerve II-XII are grossly intact, no focal motor deficit, sensory intact to light touch bilaterally.  Skin: Warm, dry and intact. No rash, cyanosis, or clubbing.  Psychiatric: Normal mood and affect. speech and behavior is normal. Judgment and thought content normal. Cognition and memory are normal.   Labs on Admission:  Results for orders placed during the hospital encounter of 04/24/11 (from the past 48 hour(s))  CBC     Status: Abnormal   Collection Time   04/24/11  1:10 PM      Component Value Range Comment   WBC 13.0 (*) 4.0 - 10.5 (K/uL)    RBC 3.21 (*) 3.87 - 5.11 (MIL/uL)    Hemoglobin 9.4 (*) 12.0 - 15.0 (g/dL)    HCT 30.4 (*) 36.0 - 46.0 (%)    MCV 94.7  78.0 - 100.0 (fL)    MCH 29.3  26.0 - 34.0 (pg)    MCHC 30.9  30.0 - 36.0 (g/dL)    RDW 15.6 (*) 11.5 - 15.5 (%)    Platelets 337  150 - 400 (K/uL)   DIFFERENTIAL     Status: Abnormal   Collection Time   04/24/11  1:10 PM      Component Value Range Comment   Neutrophils Relative 63  43 - 77 (%)    Neutro Abs 8.2 (*) 1.7 - 7.7 (K/uL)    Lymphocytes Relative 21  12 - 46 (%)    Lymphs Abs 2.7  0.7 - 4.0 (K/uL)    Monocytes Relative 4  3 - 12 (%)    Monocytes Absolute 0.5  0.1 - 1.0 (K/uL)    Eosinophils Relative 12 (*) 0 - 5 (%)    Eosinophils  Absolute 1.6 (*) 0.0 - 0.7 (K/uL)    Basophils Relative 0  0 - 1 (%)    Basophils Absolute 0.0  0.0 - 0.1 (K/uL)   COMPREHENSIVE METABOLIC PANEL     Status: Abnormal   Collection Time   04/24/11  1:10 PM      Component Value Range Comment   Sodium 142  135 - 145 (mEq/L)    Potassium 4.0  3.5 - 5.1 (mEq/L)    Chloride 108  96 - 112 (mEq/L)    CO2 26  19 - 32 (mEq/L)    Glucose, Bld 116 (*) 70 - 99 (mg/dL)    BUN 10  6 - 23 (mg/dL)    Creatinine, Ser 0.95  0.50 - 1.10 (mg/dL)    Calcium 8.9  8.4 - 10.5 (mg/dL)    Total Protein 6.0  6.0 - 8.3 (g/dL)    Albumin 3.1 (*) 3.5 - 5.2 (g/dL)    AST 21  0 - 37 (U/L)    ALT 37 (*) 0 - 35 (U/L)    Alkaline Phosphatase 153 (*) 39 - 117 (U/L)    Total Bilirubin 0.1 (*) 0.3 - 1.2 (mg/dL)    GFR calc non Af Amer 68 (*) >90 (mL/min)    GFR calc Af Amer 79 (*) >90 (mL/min)   POCT I-STAT TROPONIN I     Status: Normal   Collection Time   04/24/11  1:25 PM      Component Value Range Comment   Troponin i, poc 0.00  0.00 - 0.08 (ng/mL)    Comment 3            URINALYSIS, ROUTINE W REFLEX MICROSCOPIC     Status: Abnormal   Collection Time   04/24/11  3:30 PM      Component Value Range Comment   Color, Urine YELLOW  YELLOW     APPearance CLOUDY (*) CLEAR     Specific Gravity, Urine 1.004 (*) 1.005 - 1.030     pH 7.0  5.0 - 8.0     Glucose, UA NEGATIVE  NEGATIVE (mg/dL)    Hgb urine dipstick NEGATIVE  NEGATIVE     Bilirubin Urine NEGATIVE  NEGATIVE     Ketones, ur NEGATIVE  NEGATIVE (mg/dL)    Protein, ur NEGATIVE  NEGATIVE (mg/dL)    Urobilinogen, UA 0.2  0.0 - 1.0 (mg/dL)    Nitrite NEGATIVE  NEGATIVE     Leukocytes, UA NEGATIVE  NEGATIVE  MICROSCOPIC NOT DONE ON URINES WITH NEGATIVE PROTEIN, BLOOD, LEUKOCYTES, NITRITE, OR GLUCOSE <1000 mg/dL.    Time Spent on Admission: Over 30 minutes  Dontee Jaso 04/24/2011, 5:35 PM  [facility] Pager # [phone] Main Office # [phone]

## 2011-04-24 NOTE — ED Provider Notes (Signed)
History     CSN: [id number]  Arrival date & time 04/24/11  1204   First MD Initiated Contact with Patient 04/24/11 1221      Chief Complaint  Patient presents with  . Chest Pain    (Consider location/radiation/quality/duration/timing/severity/associated sxs/prior treatment) Patient is a 51 y.o. female presenting with fall. The history is provided by the patient and a relative. The history is limited by the condition of the patient (The patient is a poor historian, unable to given coherent details.).  Fall Incident onset: She has been falling with increasing frequency for the past several months. Incident: She reports a diagnosis of seizures, on Neurontin only, as well as recurrent syncopal episodes.  Distance fallen: The most recent fall occurred 2 days ago, details unknown, with injury to the left side of face and neck. She landed on a hard floor. She was ambulatory at the scene. Associated symptoms include loss of consciousness. Pertinent negatives include no fever and no bowel incontinence. Associated symptoms comments: Her young son is at bedside who reports participating in her care. He states her seizures consist of "being sleepy and when she's like that I just try to get her back to bed." Currently, she complains only of generalized weakness and being tired. .    Past Medical History  Diagnosis Date  . High blood pressure   . Incontinence   . Anxiety   . Manic depression   . Heart attack   . Diabetes mellitus     class 2  . Asthma   . Emphysema   . High cholesterol   . Stroke   . Sinus trouble   . Seasonal allergies   . Sleep apnea   . Disorder of nervous system   . Myocardial infarct     Past Surgical History  Procedure Date  . Abdominal hysterectomy   . Cesarean section   . Hiatal hernia repair     Family History  Problem Relation Age of Onset  . Cancer Father     prostate  . Heart failure      cousin  . Cancer Mother     lung    History  Substance  Use Topics  . Smoking status: Former Research scientist (life sciences)  . Smokeless tobacco: Not on file  . Alcohol Use: No    OB History    Grav Para Term Preterm Abortions TAB SAB Ect Mult Living                  Review of Systems  Constitutional: Negative for fever and chills.  HENT: Positive for facial swelling.        Facial bruising.  Respiratory: Negative for shortness of breath.   Cardiovascular: Negative for chest pain.  Gastrointestinal: Negative.  Negative for bowel incontinence.  Genitourinary: Negative for dysuria.  Musculoskeletal: Negative.   Skin: Negative.   Neurological: Positive for seizures, loss of consciousness, syncope and weakness.    Allergies  Codeine and Sulfa antibiotics  Home Medications   Current Outpatient Rx  Name Route Sig Dispense Refill  . ALBUTEROL SULFATE (2.5 MG/3ML) 0.083% IN NEBU Nebulization Take 2.5 mg by nebulization every 6 (six) hours as needed. As needed for shortness of breath.    . ATORVASTATIN CALCIUM 40 MG PO TABS Oral Take 40 mg by mouth daily.    . BUDESONIDE-FORMOTEROL FUMARATE 160-4.5 MCG/ACT IN AERO Inhalation Inhale 2 puffs into the lungs 2 (two) times daily.      . BUPROPION HCL ER (SR) 150  MG PO TB12 Oral Take 150 mg by mouth 2 (two) times daily.     [name] CLONAZEPAM 1 MG PO TABS Oral Take 1 mg by mouth 3 (three) times daily as needed. anxiety     . CLONIDINE HCL 0.2 MG PO TABS Oral Take 0.2 mg by mouth 3 (three) times daily.      . CYCLOBENZAPRINE HCL 10 MG PO TABS Oral Take 10 mg by mouth 3 (three) times daily as needed. Neck/shoulder pain.    . DULOXETINE HCL 60 MG PO CPEP Oral Take 60 mg by mouth 2 (two) times daily.      . FUROSEMIDE 40 MG PO TABS Oral Take 40 mg by mouth daily.    [name] GABAPENTIN 300 MG PO CAPS Oral Take 300 mg by mouth 3 (three) times daily.     [name] GLUCOSE BLOOD VI STRP Other 1 each by Other route as needed. Use as instructed     . ACCU-CHEK MULTICLIX LANCETS MISC Other 1 each by Other route as needed. Use as instructed     .  LISDEXAMFETAMINE DIMESYLATE 70 MG PO CAPS Oral Take 70 mg by mouth every morning.      [name] LISINOPRIL 20 MG PO TABS Oral Take 20 mg by mouth 2 (two) times daily.      [name] LORATADINE 10 MG PO TABS Oral Take 10 mg by mouth daily.      . MELOXICAM 7.5 MG PO TABS Oral Take 1 tablet (7.5 mg total) by mouth daily. 14 tablet 0  . METFORMIN HCL ER (MOD) 1000 MG PO TB24 Oral Take 1,000 mg by mouth daily with breakfast.      . MONTELUKAST SODIUM 10 MG PO TABS Oral Take 10 mg by mouth at bedtime.      . OMEPRAZOLE 40 MG PO CPDR Oral Take 40 mg by mouth daily.      . OXYBUTYNIN CHLORIDE 5 MG PO TABS Oral Take 5 mg by mouth 3 (three) times daily.      [name] RANITIDINE HCL 300 MG PO TABS Oral Take 300 mg by mouth 2 (two) times daily.      . TRAMADOL HCL 50 MG PO TABS Oral Take 50 mg by mouth 3 (three) times daily.     [name] VALACYCLOVIR HCL 500 MG PO TABS Oral Take 500 mg by mouth 2 (two) times daily.      . VENLAFAXINE HCL 75 MG PO TABS Oral Take 75 mg by mouth daily.        BP 106/46  Pulse 83  Temp(Src) 97.7 F (36.5 C) (Oral)  Resp 20  SpO2 97%  Physical Exam  Constitutional: [name] oriented to person, place, and time. She appears well-developed and well-nourished. No distress.       Mildly somnolent.  HENT:  Head: Normocephalic.       Left sided facial bruising around eye and in lower jaw into anterior neck. No mass, no lesion/laceration.   Neck: Normal range of motion. Neck supple.  Cardiovascular: Normal rate and regular rhythm.   Pulmonary/Chest: Effort normal and breath sounds normal.  Abdominal: Soft. Bowel sounds are normal. There is no tenderness. There is no rebound and no guarding.  Musculoskeletal: Normal range of motion. She exhibits no edema.  Neurological: [name] alert and oriented to person, place, and time. No cranial nerve deficit. Coordination normal.  Skin: Skin is warm and dry. No rash noted.  Psychiatric: She has a normal mood and affect.  ED Course  Procedures (including  critical care time)  Labs Reviewed  CBC - Abnormal; Notable for the following:    WBC 13.0 (*)    RBC 3.21 (*)    Hemoglobin 9.4 (*)    HCT 30.4 (*)    RDW 15.6 (*)    All other components within normal limits  DIFFERENTIAL - Abnormal; Notable for the following:    Neutro Abs 8.2 (*)    Eosinophils Relative 12 (*)    Eosinophils Absolute 1.6 (*)    All other components within normal limits  POCT I-STAT TROPONIN I  COMPREHENSIVE METABOLIC PANEL  URINALYSIS, ROUTINE W REFLEX MICROSCOPIC   Results for orders placed during the hospital encounter of 04/24/11  CBC      Component Value Range   WBC 13.0 (*) 4.0 - 10.5 (K/uL)   RBC 3.21 (*) 3.87 - 5.11 (MIL/uL)   Hemoglobin 9.4 (*) 12.0 - 15.0 (g/dL)   HCT 30.4 (*) 36.0 - 46.0 (%)   MCV 94.7  78.0 - 100.0 (fL)   MCH 29.3  26.0 - 34.0 (pg)   MCHC 30.9  30.0 - 36.0 (g/dL)   RDW 15.6 (*) 11.5 - 15.5 (%)   Platelets 337  150 - 400 (K/uL)  DIFFERENTIAL      Component Value Range   Neutrophils Relative 63  43 - 77 (%)   Neutro Abs 8.2 (*) 1.7 - 7.7 (K/uL)   Lymphocytes Relative 21  12 - 46 (%)   Lymphs Abs 2.7  0.7 - 4.0 (K/uL)   Monocytes Relative 4  3 - 12 (%)   Monocytes Absolute 0.5  0.1 - 1.0 (K/uL)   Eosinophils Relative 12 (*) 0 - 5 (%)   Eosinophils Absolute 1.6 (*) 0.0 - 0.7 (K/uL)   Basophils Relative 0  0 - 1 (%)   Basophils Absolute 0.0  0.0 - 0.1 (K/uL)  COMPREHENSIVE METABOLIC PANEL      Component Value Range   Sodium 142  135 - 145 (mEq/L)   Potassium 4.0  3.5 - 5.1 (mEq/L)   Chloride 108  96 - 112 (mEq/L)   CO2 26  19 - 32 (mEq/L)   Glucose, Bld 116 (*) 70 - 99 (mg/dL)   BUN 10  6 - 23 (mg/dL)   Creatinine, Ser 0.95  0.50 - 1.10 (mg/dL)   Calcium 8.9  8.4 - 10.5 (mg/dL)   Total Protein 6.0  6.0 - 8.3 (g/dL)   Albumin 3.1 (*) 3.5 - 5.2 (g/dL)   AST 21  0 - 37 (U/L)   ALT 37 (*) 0 - 35 (U/L)   Alkaline Phosphatase 153 (*) 39 - 117 (U/L)   Total Bilirubin 0.1 (*) 0.3 - 1.2 (mg/dL)   GFR calc non Af Amer 68 (*)  >90 (mL/min)   GFR calc Af Amer 79 (*) >90 (mL/min)  POCT I-STAT TROPONIN I      Component Value Range   Troponin i, poc 0.00  0.00 - 0.08 (ng/mL)   Comment 3           Ct Head Wo Contrast  04/24/2011  *RADIOLOGY REPORT*  Clinical Data:  [place] 3 days ago, headache and facial pain.  CT HEAD WITHOUT CONTRAST CT MAXILLOFACIAL WITHOUT CONTRAST  Technique:  Multidetector CT imaging of the head and maxillofacial structures were performed using the standard protocol without intravenous contrast. Multiplanar CT image reconstructions of the maxillofacial structures were also generated.  Comparison:  None.  CT HEAD  Findings: There  is no evidence for acute infarction, intracranial hemorrhage, mass lesion, hydrocephalus, or extra-axial fluid. There is no atrophy or white matter disease.  Calvarium is intact. Clear paranasal sinuses.  Minimal dependent fluid left mastoid not clearly acute.  IMPRESSION: Negative exam.  CT MAXILLOFACIAL  Findings:   There is no visible facial fracture or acute sinus opacity.  Significant bruising is seen in the malar region on the left.  There are multiple teeth missing which is a chronic finding. The mandibular condyles are slightly anteriorly displaced in an open mouth type configuration of the TMJ, but I do not definitely see a mandibular fracture or TMJ dislocation. Slight double density of right TMJ appears to not represent a fracture but patient movement.  IMPRESSION: No visible facial fracture or acute sinus opacity.  Left malar hematoma.  Slight open mouth position of the mandible without definite TMJ dislocation.  Findings discussed with ED provider.  Original Report Authenticated By: Staci Righter, M.D.   Dg Chest Portable 1 View  04/24/2011  *RADIOLOGY REPORT*  Clinical Data: Chest and left arm pain.  Syncopal episodes.  PORTABLE CHEST - 1 VIEW  Comparison: 01/15/2011 and 10/03/2010.  Findings: 1319 hours.  Examination was repeated.  Heart size and mediastinal contours are  stable.  The lung bases are incompletely penetrated on the initial view.  On the second examination, the lung bases appear clear and no significant pleural effusion is identified.  There is no pneumothorax.  IMPRESSION: No active cardiopulmonary process.  Original Report Authenticated By: Vivia Ewing, M.D.   Ct Maxillofacial Wo Cm  04/24/2011  *RADIOLOGY REPORT*  Clinical Data:  [place] 3 days ago, headache and facial pain.  CT HEAD WITHOUT CONTRAST CT MAXILLOFACIAL WITHOUT CONTRAST  Technique:  Multidetector CT imaging of the head and maxillofacial structures were performed using the standard protocol without intravenous contrast. Multiplanar CT image reconstructions of the maxillofacial structures were also generated.  Comparison:  None.  CT HEAD  Findings: There is no evidence for acute infarction, intracranial hemorrhage, mass lesion, hydrocephalus, or extra-axial fluid. There is no atrophy or white matter disease.  Calvarium is intact. Clear paranasal sinuses.  Minimal dependent fluid left mastoid not clearly acute.  IMPRESSION: Negative exam.  CT MAXILLOFACIAL  Findings:   There is no visible facial fracture or acute sinus opacity.  Significant bruising is seen in the malar region on the left.  There are multiple teeth missing which is a chronic finding. The mandibular condyles are slightly anteriorly displaced in an open mouth type configuration of the TMJ, but I do not definitely see a mandibular fracture or TMJ dislocation. Slight double density of right TMJ appears to not represent a fracture but patient movement.  IMPRESSION: No visible facial fracture or acute sinus opacity.  Left malar hematoma.  Slight open mouth position of the mandible without definite TMJ dislocation.  Findings discussed with ED provider.  Original Report Authenticated By: Staci Righter, M.D.    No results found.   No diagnosis found. 1. Syncope 2. Falls   MDM  Patient with multiple episodes falls, questionable  syncope vs. Seizures. Will admit.         Leotis Shames, PA-C 04/24/11 1515

## 2011-04-24 NOTE — ED Notes (Signed)
EMS brings pt from home chest pain beginning yesterday. Pt reports passed out/ questionable seizure  on Saturday went to [facility] and for CP left without being seen. Pt reports history of passing out and seizures. Pt has bruises to left side of face, from fall Saturday. No nausea, or vomiting, or shortness of breath.

## 2011-04-24 NOTE — Significant Event (Addendum)
Called to evaluate altered mental status in this 50yoF admitted for syncope, fall, and facial hematoma.   Per nursing, the pt was completely normal mental status, alert, awake, and walked to the bathroom completely on her own, witnessed by the nurse tonight. She walked back to the bed and got in, got comfortable, and the nurse went to get meds, came back and within a span of a few minutes, the pt is now acutely completely different, is altered, slurring speech, not as responsive.   By my exam, vitals are stable, pt breathing comfortably, is rousable to tactile stimulation and able to say she's in the hospital, but is very lethargic appearing. She has facial hematoma, lungs are grossly clear, heart regular and not tachycardic, abdomen massively obese but soft nontender and no facial grimacing. No generalized tonic clonic activities are noted  Her young son at the bedside said that she will have these episodes occasionally where her mental status is poor and she will "have to rest" for several hours before becoming more alert. He denies any frank tonic clonic movements. He states this has been ongoing for at least two years. He calls them "sleeping spells." He also describes episodes during sleep where she won't breathe for a minute at a time, he'll arouse her to breathe, sounds like central sleep apnea.   Then, about 5-10 minutes later, the nurse called me back in -- she is back completely normal mental status, literally within less than 10 minutes after I evaluated her and she was quite obtunded. [name] now sitting up on the side of the bed, talking, alert. She states she went to the bathroom and has no recollection of my having talked to her, sternally rubbing her.   She states that up [place] in the recent past she was being evaluated for twitching like movements and episodes where she will "blank out" and was being given Gabapentin to try and control this. She was told to see a neurologist.    Overall,  I very highly suspect this lady is having complex partial or generalized non-convulsive seizures. [name] back to her mental status baseline. Nursing and primary MD notes indicate Neuro has been consulted which we would appreciate. I think an EEG and MRI brain would be helpful but will defer this to their expertise.   Furthermore, her son describes what sounds like sleep apneic episodes, so this may be a contributing factor. I will sign these things out to her day MD and just monitor her status for now overnight.    Addendum: Pt had head CT that was negative for acute process earlier today. Given 2 yr duration of her symptoms, no need for acute repeat head scan.

## 2011-04-25 ENCOUNTER — Observation Stay (HOSPITAL_COMMUNITY): Payer: Medicaid Other

## 2011-04-25 ENCOUNTER — Inpatient Hospital Stay (HOSPITAL_COMMUNITY)
Admit: 2011-04-25 | Discharge: 2011-04-25 | Disposition: A | Discharge status: Home / Self Care | Payer: Medicaid Other | Attending: Neurology | Admitting: Neurology

## 2011-04-25 DIAGNOSIS — R569 Unspecified convulsions: Secondary | ICD-10-CM

## 2011-04-25 LAB — CBC
HCT: 31.6 % — ABNORMAL LOW (ref 36.0–46.0)
Hemoglobin: 9.9 g/dL — ABNORMAL LOW (ref 12.0–15.0)
MCH: 29.6 pg (ref 26.0–34.0)
MCHC: 31.3 g/dL (ref 30.0–36.0)
MCV: 94.3 fL (ref 78.0–100.0)
Platelets: 360 10*3/uL (ref 150–400)
RBC: 3.35 MIL/uL — ABNORMAL LOW (ref 3.87–5.11)
RDW: 15.7 % — ABNORMAL HIGH (ref 11.5–15.5)
WBC: 12.5 10*3/uL — ABNORMAL HIGH (ref 4.0–10.5)

## 2011-04-25 LAB — CARDIAC PANEL(CRET KIN+CKTOT+MB+TROPI)
CK, MB: 3.2 ng/mL (ref 0.3–4.0)
CK, MB: 3 ng/mL (ref 0.3–4.0)
Relative Index: 1.5 (ref 0.0–2.5)
Relative Index: 1.7 (ref 0.0–2.5)
Total CK: 213 U/L — ABNORMAL HIGH (ref 7–177)
Total CK: 181 U/L — ABNORMAL HIGH (ref 7–177)
Troponin I: <0.3 ng/mL (ref <0.30)
Troponin I: <0.3 ng/mL (ref <0.30)

## 2011-04-25 LAB — GLUCOSE, CAPILLARY
Glucose-Capillary: 106 mg/dL — ABNORMAL HIGH (ref 70–99)
Glucose-Capillary: 91 mg/dL (ref 70–99)
Glucose-Capillary: 124 mg/dL — ABNORMAL HIGH (ref 70–99)
Glucose-Capillary: 111 mg/dL — ABNORMAL HIGH (ref 70–99)

## 2011-04-25 LAB — COMPREHENSIVE METABOLIC PANEL
ALT: 36 U/L — ABNORMAL HIGH (ref 0–35)
AST: 21 U/L (ref 0–37)
Albumin: 3.2 g/dL — ABNORMAL LOW (ref 3.5–5.2)
Alkaline Phosphatase: 161 U/L — ABNORMAL HIGH (ref 39–117)
BUN: 9 mg/dL (ref 6–23)
CO2: 32 mEq/L (ref 19–32)
Calcium: 9.3 mg/dL (ref 8.4–10.5)
Chloride: 101 mEq/L (ref 96–112)
Creatinine, Ser: 1.05 mg/dL (ref 0.50–1.10)
GFR calc Af Amer: 70 mL/min — ABNORMAL LOW (ref >90)
GFR calc non Af Amer: 60 mL/min — ABNORMAL LOW (ref >90)
Glucose, Bld: 111 mg/dL — ABNORMAL HIGH (ref 70–99)
Potassium: 3.6 mEq/L (ref 3.5–5.1)
Sodium: 140 mEq/L (ref 135–145)
Total Bilirubin: 0.2 mg/dL — ABNORMAL LOW (ref 0.3–1.2)
Total Protein: 6.3 g/dL (ref 6.0–8.3)

## 2011-04-25 LAB — URINE CULTURE
Colony Count: >=100000
Culture  Setup Time: 201304090159

## 2011-04-25 MED ORDER — ENOXAPARIN SODIUM 60 MG/0.6ML ~~LOC~~ SOLN
60.0000 mg | SUBCUTANEOUS | Status: DC
  Administered 2011-04-25 – 2011-04-26 (×2): 60 mg via SUBCUTANEOUS
  Filled 2011-04-25 (×4): qty 0.6

## 2011-04-25 MED ORDER — HYDRALAZINE HCL 25 MG PO TABS
25.0000 mg | ORAL_TABLET | Freq: Four times a day (QID) | ORAL | Status: DC
  Administered 2011-04-25 – 2011-04-27 (×9): 25 mg via ORAL
  Filled 2011-04-25 (×17): qty 1

## 2011-04-25 MED ORDER — ALBUTEROL SULFATE HFA 108 (90 BASE) MCG/ACT IN AERS
2.0000 | INHALATION_SPRAY | RESPIRATORY_TRACT | Status: DC | PRN
  Administered 2011-04-25 – 2011-04-27 (×3): 2 via RESPIRATORY_TRACT
  Filled 2011-04-25: qty 6.7

## 2011-04-25 MED ORDER — MORPHINE SULFATE 2 MG/ML IJ SOLN
1.0000 mg | INTRAMUSCULAR | Status: DC | PRN
  Administered 2011-04-25 – 2011-04-26 (×2): 1 mg via INTRAVENOUS
  Filled 2011-04-25 (×2): qty 1

## 2011-04-25 MED ORDER — LORAZEPAM 2 MG/ML IJ SOLN
1.0000 mg | Freq: Once | INTRAMUSCULAR | Status: AC
Start: 2011-04-25 — End: 2011-04-25
  Administered 2011-04-25: 1 mg via INTRAVENOUS
  Filled 2011-04-25: qty 1

## 2011-04-25 NOTE — Consult Note (Addendum)
[facility] CONSULT NOTE     Reason for Consult: questionable seizure    HPI:    Tiffany [name] is an 51 y.o. female with known sleep apnea since 2000. She wears a C-pap at night and has not had her settings checked in the past year.  She has never seen a neurologist and could not tell me if she actually sees a sleep specialist.  Her son who is in the room states she has been having spells which can occur at any time of day.  The spells often consist of her becoming "very sleep like, ten either falling or being out of it for 5-10 minutes".  She then will quickly return to baseline.  She has had one such spell while in the hospital. "Many years ago" she was placed on neuron tin with belief these may be seizures.  Patient herself believes these are seizures.   Past Medical History  Diagnosis Date  . High blood pressure   . Incontinence   . Anxiety   . Manic depression   . Heart attack   . Diabetes mellitus     class 2  . Asthma   . Emphysema   . High cholesterol   . Stroke   . Sinus trouble   . Seasonal allergies   . Sleep apnea   . Disorder of nervous system   . Myocardial infarct   . Complication of anesthesia     decreased bp, decreased heart rate    Past Surgical History  Procedure Date  . Abdominal hysterectomy   . Cesarean section   . Hiatal hernia repair     Family History  Problem Relation Age of Onset  . Cancer Father     prostate  . Heart failure      cousin  . Cancer Mother     lung    Social History:  reports that she quit smoking about 34 years ago. Her smoking use included Cigarettes. She has never used smokeless tobacco. She reports that she does not drink alcohol or use illicit drugs.  Allergies  Allergen Reactions  . Codeine Nausea And Vomiting  . Sulfa Antibiotics Itching    Medications:    Scheduled:   . acyclovir ointment  1 application Topical TID  . atorvastatin  40 mg Oral Daily  .  budesonide-formoterol  2 puff Inhalation BID  . buPROPion  150 mg Oral BID  . cloNIDine  0.2 mg Oral TID  . DULoxetine  60 mg Oral BID  . enoxaparin  30 mg Subcutaneous Q24H  . fluticasone  2 spray Each Nare Daily  . furosemide  40 mg Oral Daily  . gabapentin  300 mg Oral TID  . hydrALAZINE  25 mg Oral Q6H  . insulin aspart  0-15 Units Subcutaneous TID WC  . insulin aspart  0-5 Units Subcutaneous QHS  . lisdexamfetamine  70 mg Oral Q breakfast  . lisinopril  20 mg Oral BID  . loratadine  10 mg Oral Daily  . meloxicam  7.5 mg Oral Daily  . montelukast  10 mg Oral QHS  . oxybutynin  5 mg Oral TID  . pantoprazole  40 mg Oral Q1200  . sodium chloride  3 mL Intravenous Q12H  . traMADol  50 mg Oral TID  . valACYclovir  500 mg Oral BID  . venlafaxine  75 mg  Oral Daily  . DISCONTD: albuterol  2.5 mg Nebulization Q6H  . DISCONTD: ipratropium  0.5 mg Nebulization Q6H   Continuous:   Review of Systems - General ROS: negative for - chills, fatigue, fever or hot flashes Hematological and Lymphatic ROS: negative for - bruising, fatigue, jaundice or pallor Endocrine ROS: negative for - hair pattern changes, hot flashes, mood swings or skin changes Respiratory ROS: negative for - cough, hemoptysis, orthopnea or wheezing Cardiovascular ROS: negative for - dyspnea on exertion, orthopnea, palpitations or shortness of breath Gastrointestinal ROS: negative for - abdominal pain, appetite loss, blood in stools, diarrhea or hematemesis Musculoskeletal ROS: negative for - joint pain, joint stiffness, joint swelling or muscle pain Neurological ROS: See HPI Dermatological ROS: negative for dry skin, pruritus and rash   Blood pressure 143/101, pulse 83, temperature 98.1 F (36.7 C), temperature source Oral, resp. rate 20, height 5\' 4"  (1.626 m), weight 123.877 kg (273 lb 1.6 oz), SpO2 95.00%.   Neurologic Examination:   Of note: patient was not fully invested in physical exam and would not give full  effort.  Mental Status: Alert, oriented, thought content appropriate.  Speech fluent without evidence of aphasia. Able to follow 3 step commands without difficulty. Cranial Nerves: II-Visual fields grossly intact. III/IV/VI-Extraocular movements intact.  Pupils reactive bilaterally. V/VII-Smile asymmetric with slight right asymmetry  VIII-grossly intact IX/X-normal gag XI-bilateral shoulder shrug XII-midline tongue extension Motor: 4/5 bilaterally with normal tone and bulk Sensory: Pinprick and light touch intact throughout, bilaterally Deep Tendon Reflexes: 2+ and symmetric throughout Plantars: Downgoing bilaterally Cerebellar: Normal finger-to-nose,   No results found for this basename: cbc, bmp, coags, chol, tri, ldl, hga1c    Results for orders placed during the hospital encounter of 04/24/11 (from the past 48 hour(s))  CBC     Status: Abnormal   Collection Time   04/24/11  1:10 PM      Component Value Range Comment   WBC 13.0 (*) 4.0 - 10.5 (K/uL)    RBC 3.21 (*) 3.87 - 5.11 (MIL/uL)    Hemoglobin 9.4 (*) 12.0 - 15.0 (g/dL)    HCT 30.4 (*) 36.0 - 46.0 (%)    MCV 94.7  78.0 - 100.0 (fL)    MCH 29.3  26.0 - 34.0 (pg)    MCHC 30.9  30.0 - 36.0 (g/dL)    RDW 15.6 (*) 11.5 - 15.5 (%)    Platelets 337  150 - 400 (K/uL)   DIFFERENTIAL     Status: Abnormal   Collection Time   04/24/11  1:10 PM      Component Value Range Comment   Neutrophils Relative 63  43 - 77 (%)    Neutro Abs 8.2 (*) 1.7 - 7.7 (K/uL)    Lymphocytes Relative 21  12 - 46 (%)    Lymphs Abs 2.7  0.7 - 4.0 (K/uL)    Monocytes Relative 4  3 - 12 (%)    Monocytes Absolute 0.5  0.1 - 1.0 (K/uL)    Eosinophils Relative 12 (*) 0 - 5 (%)    Eosinophils Absolute 1.6 (*) 0.0 - 0.7 (K/uL)    Basophils Relative 0  0 - 1 (%)    Basophils Absolute 0.0  0.0 - 0.1 (K/uL)   COMPREHENSIVE METABOLIC PANEL     Status: Abnormal   Collection Time   04/24/11  1:10 PM      Component Value Range Comment   Sodium 142  135 - 145  (mEq/L)    Potassium  4.0  3.5 - 5.1 (mEq/L)    Chloride 108  96 - 112 (mEq/L)    CO2 26  19 - 32 (mEq/L)    Glucose, Bld 116 (*) 70 - 99 (mg/dL)    BUN 10  6 - 23 (mg/dL)    Creatinine, Ser 0.95  0.50 - 1.10 (mg/dL)    Calcium 8.9  8.4 - 10.5 (mg/dL)    Total Protein 6.0  6.0 - 8.3 (g/dL)    Albumin 3.1 (*) 3.5 - 5.2 (g/dL)    AST 21  0 - 37 (U/L)    ALT 37 (*) 0 - 35 (U/L)    Alkaline Phosphatase 153 (*) 39 - 117 (U/L)    Total Bilirubin 0.1 (*) 0.3 - 1.2 (mg/dL)    GFR calc non Af Amer 68 (*) >90 (mL/min)    GFR calc Af Amer 79 (*) >90 (mL/min)   POCT I-STAT TROPONIN I     Status: Normal   Collection Time   04/24/11  1:25 PM      Component Value Range Comment   Troponin i, poc 0.00  0.00 - 0.08 (ng/mL)    Comment 3            URINALYSIS, ROUTINE W REFLEX MICROSCOPIC     Status: Abnormal   Collection Time   04/24/11  3:30 PM      Component Value Range Comment   Color, Urine YELLOW  YELLOW     APPearance CLOUDY (*) CLEAR     Specific Gravity, Urine 1.004 (*) 1.005 - 1.030     pH 7.0  5.0 - 8.0     Glucose, UA NEGATIVE  NEGATIVE (mg/dL)    Hgb urine dipstick NEGATIVE  NEGATIVE     Bilirubin Urine NEGATIVE  NEGATIVE     Ketones, ur NEGATIVE  NEGATIVE (mg/dL)    Protein, ur NEGATIVE  NEGATIVE (mg/dL)    Urobilinogen, UA 0.2  0.0 - 1.0 (mg/dL)    Nitrite NEGATIVE  NEGATIVE     Leukocytes, UA NEGATIVE  NEGATIVE  MICROSCOPIC NOT DONE ON URINES WITH NEGATIVE PROTEIN, BLOOD, LEUKOCYTES, NITRITE, OR GLUCOSE <1000 mg/dL.  GLUCOSE, CAPILLARY     Status: Normal   Collection Time   04/24/11  5:57 PM      Component Value Range Comment   Glucose-Capillary 84  70 - 99 (mg/dL)   CBC     Status: Abnormal   Collection Time   04/24/11  7:00 PM      Component Value Range Comment   WBC 13.0 (*) 4.0 - 10.5 (K/uL)    RBC 3.35 (*) 3.87 - 5.11 (MIL/uL)    Hemoglobin 9.8 (*) 12.0 - 15.0 (g/dL)    HCT 31.5 (*) 36.0 - 46.0 (%)    MCV 94.0  78.0 - 100.0 (fL)    MCH 29.3  26.0 - 34.0 (pg)    MCHC 31.1   30.0 - 36.0 (g/dL)    RDW 15.7 (*) 11.5 - 15.5 (%)    Platelets 356  150 - 400 (K/uL)   COMPREHENSIVE METABOLIC PANEL     Status: Abnormal   Collection Time   04/24/11  7:00 PM      Component Value Range Comment   Sodium 142  135 - 145 (mEq/L)    Potassium 4.1  3.5 - 5.1 (mEq/L)    Chloride 105  96 - 112 (mEq/L)    CO2 31  19 - 32 (mEq/L)    Glucose, Bld 103 (*)  70 - 99 (mg/dL)    BUN 9  6 - 23 (mg/dL)    Creatinine, Ser 0.92  0.50 - 1.10 (mg/dL)    Calcium 9.2  8.4 - 10.5 (mg/dL)    Total Protein 6.4  6.0 - 8.3 (g/dL)    Albumin 3.3 (*) 3.5 - 5.2 (g/dL)    AST 20  0 - 37 (U/L)    ALT 38 (*) 0 - 35 (U/L)    Alkaline Phosphatase 163 (*) 39 - 117 (U/L)    Total Bilirubin 0.2 (*) 0.3 - 1.2 (mg/dL)    GFR calc non Af Amer 71 (*) >90 (mL/min)    GFR calc Af Amer 82 (*) >90 (mL/min)   MAGNESIUM     Status: Normal   Collection Time   04/24/11  7:00 PM      Component Value Range Comment   Magnesium 2.0  1.5 - 2.5 (mg/dL)   PHOSPHORUS     Status: Normal   Collection Time   04/24/11  7:00 PM      Component Value Range Comment   Phosphorus 3.2  2.3 - 4.6 (mg/dL)   DIFFERENTIAL     Status: Abnormal   Collection Time   04/24/11  7:00 PM      Component Value Range Comment   Neutrophils Relative 60  43 - 77 (%)    Neutro Abs 7.8 (*) 1.7 - 7.7 (K/uL)    Lymphocytes Relative 24  12 - 46 (%)    Lymphs Abs 3.2  0.7 - 4.0 (K/uL)    Monocytes Relative 3  3 - 12 (%)    Monocytes Absolute 0.4  0.1 - 1.0 (K/uL)    Eosinophils Relative 12 (*) 0 - 5 (%)    Eosinophils Absolute 1.6 (*) 0.0 - 0.7 (K/uL)    Basophils Relative 0  0 - 1 (%)    Basophils Absolute 0.0  0.0 - 0.1 (K/uL)   APTT     Status: Normal   Collection Time   04/24/11  7:00 PM      Component Value Range Comment   aPTT 35  24 - 37 (seconds)   PROTIME-INR     Status: Normal   Collection Time   04/24/11  7:00 PM      Component Value Range Comment   Prothrombin Time 13.5  11.6 - 15.2 (seconds)    INR 1.01  0.00 - 1.49    TSH      Status: Normal   Collection Time   04/24/11  7:00 PM      Component Value Range Comment   TSH 1.398  0.350 - 4.500 (uIU/mL)   HEMOGLOBIN A1C     Status: Abnormal   Collection Time   04/24/11  7:00 PM      Component Value Range Comment   Hemoglobin A1C 5.9 (*) <5.7 (%)    Mean Plasma Glucose 123 (*) <117 (mg/dL)   CARDIAC PANEL(CRET KIN+CKTOT+MB+TROPI)     Status: Abnormal   Collection Time   04/24/11  7:00 PM      Component Value Range Comment   Total CK 272 (*) 7 - 177 (U/L)    CK, MB 4.5 (*) 0.3 - 4.0 (ng/mL)    Troponin I <0.30  <0.30 (ng/mL)    Relative Index 1.7  0.0 - 2.5    PRO B NATRIURETIC PEPTIDE     Status: Abnormal   Collection Time   04/24/11  7:00 PM  Component Value Range Comment   Pro B Natriuretic peptide (BNP) 159.0 (*) 0 - 125 (pg/mL)   GLUCOSE, CAPILLARY     Status: Abnormal   Collection Time   04/24/11  9:24 PM      Component Value Range Comment   Glucose-Capillary 120 (*) 70 - 99 (mg/dL)   CARDIAC PANEL(CRET KIN+CKTOT+MB+TROPI)     Status: Abnormal   Collection Time   04/25/11  1:55 AM      Component Value Range Comment   Total CK 213 (*) 7 - 177 (U/L)    CK, MB 3.2  0.3 - 4.0 (ng/mL)    Troponin I <0.30  <0.30 (ng/mL)    Relative Index 1.5  0.0 - 2.5    COMPREHENSIVE METABOLIC PANEL     Status: Abnormal   Collection Time   04/25/11  1:55 AM      Component Value Range Comment   Sodium 140  135 - 145 (mEq/L)    Potassium 3.6  3.5 - 5.1 (mEq/L)    Chloride 101  96 - 112 (mEq/L)    CO2 32  19 - 32 (mEq/L)    Glucose, Bld 111 (*) 70 - 99 (mg/dL)    BUN 9  6 - 23 (mg/dL)    Creatinine, Ser 1.05  0.50 - 1.10 (mg/dL)    Calcium 9.3  8.4 - 10.5 (mg/dL)    Total Protein 6.3  6.0 - 8.3 (g/dL)    Albumin 3.2 (*) 3.5 - 5.2 (g/dL)    AST 21  0 - 37 (U/L)    ALT 36 (*) 0 - 35 (U/L)    Alkaline Phosphatase 161 (*) 39 - 117 (U/L)    Total Bilirubin 0.2 (*) 0.3 - 1.2 (mg/dL)    GFR calc non Af Amer 60 (*) >90 (mL/min)    GFR calc Af Amer 70 (*) >90 (mL/min)   CBC      Status: Abnormal   Collection Time   04/25/11  1:55 AM      Component Value Range Comment   WBC 12.5 (*) 4.0 - 10.5 (K/uL)    RBC 3.35 (*) 3.87 - 5.11 (MIL/uL)    Hemoglobin 9.9 (*) 12.0 - 15.0 (g/dL)    HCT 31.6 (*) 36.0 - 46.0 (%)    MCV 94.3  78.0 - 100.0 (fL)    MCH 29.6  26.0 - 34.0 (pg)    MCHC 31.3  30.0 - 36.0 (g/dL)    RDW 15.7 (*) 11.5 - 15.5 (%)    Platelets 360  150 - 400 (K/uL)   GLUCOSE, CAPILLARY     Status: Abnormal   Collection Time   04/25/11  8:37 AM      Component Value Range Comment   Glucose-Capillary 106 (*) 70 - 99 (mg/dL)     Ct Head Wo Contrast  04/24/2011  *RADIOLOGY REPORT*  Clinical Data:  [place] 3 days ago, headache and facial pain.  CT HEAD WITHOUT CONTRAST CT MAXILLOFACIAL WITHOUT CONTRAST  Technique:  Multidetector CT imaging of the head and maxillofacial structures were performed using the standard protocol without intravenous contrast. Multiplanar CT image reconstructions of the maxillofacial structures were also generated.  Comparison:  None.  CT HEAD  Findings: There is no evidence for acute infarction, intracranial hemorrhage, mass lesion, hydrocephalus, or extra-axial fluid. There is no atrophy or white matter disease.  Calvarium is intact. Clear paranasal sinuses.  Minimal dependent fluid left mastoid not clearly acute.  IMPRESSION: Negative exam.  CT MAXILLOFACIAL  Findings:   There is no visible facial fracture or acute sinus opacity.  Significant bruising is seen in the malar region on the left.  There are multiple teeth missing which is a chronic finding. The mandibular condyles are slightly anteriorly displaced in an open mouth type configuration of the TMJ, but I do not definitely see a mandibular fracture or TMJ dislocation. Slight double density of right TMJ appears to not represent a fracture but patient movement.  IMPRESSION: No visible facial fracture or acute sinus opacity.  Left malar hematoma.  Slight open mouth position of the mandible without  definite TMJ dislocation.  Findings discussed with ED provider.  Original Report Authenticated By: Staci Righter, M.D.   Dg Chest Portable 1 View  04/24/2011  *RADIOLOGY REPORT*  Clinical Data: Chest and left arm pain.  Syncopal episodes.  PORTABLE CHEST - 1 VIEW  Comparison: 01/15/2011 and 10/03/2010.  Findings: 1319 hours.  Examination was repeated.  Heart size and mediastinal contours are stable.  The lung bases are incompletely penetrated on the initial view.  On the second examination, the lung bases appear clear and no significant pleural effusion is identified.  There is no pneumothorax.  IMPRESSION: No active cardiopulmonary process.  Original Report Authenticated By: Vivia Ewing, M.D.   Ct Maxillofacial Wo Cm  04/24/2011  *RADIOLOGY REPORT*  Clinical Data:  [place] 3 days ago, headache and facial pain.  CT HEAD WITHOUT CONTRAST CT MAXILLOFACIAL WITHOUT CONTRAST  Technique:  Multidetector CT imaging of the head and maxillofacial structures were performed using the standard protocol without intravenous contrast. Multiplanar CT image reconstructions of the maxillofacial structures were also generated.  Comparison:  None.  CT HEAD  Findings: There is no evidence for acute infarction, intracranial hemorrhage, mass lesion, hydrocephalus, or extra-axial fluid. There is no atrophy or white matter disease.  Calvarium is intact. Clear paranasal sinuses.  Minimal dependent fluid left mastoid not clearly acute.  IMPRESSION: Negative exam.  CT MAXILLOFACIAL  Findings:   There is no visible facial fracture or acute sinus opacity.  Significant bruising is seen in the malar region on the left.  There are multiple teeth missing which is a chronic finding. The mandibular condyles are slightly anteriorly displaced in an open mouth type configuration of the TMJ, but I do not definitely see a mandibular fracture or TMJ dislocation. Slight double density of right TMJ appears to not represent a fracture but patient  movement.  IMPRESSION: No visible facial fracture or acute sinus opacity.  Left malar hematoma.  Slight open mouth position of the mandible without definite TMJ dislocation.  Findings discussed with ED provider.  Original Report Authenticated By: Staci Righter, M.D.     Assessment/Plan:   51 YO female with sleep apnea, history of stroke and periods of AMS not associated with TC movement, incontinence or tongue biting.  Differential includes possible seizure activity, for which we will obtain MRI and EEG, versus sleep apnea or syncope.    Recommend: 1) MRI brain 2) EEG 3) if EEG is normal and continues to have spells may need prolonged EEG with simultaneous video recording 4) would not add further AED unless this EEG is abnormal indicating seizure disorder or patient has an unequivocal weakness seizure.    Etta Quill PA-C [facility] [phone]  04/25/2011, 8:42 AM

## 2011-04-25 NOTE — Progress Notes (Signed)
UR complete 

## 2011-04-25 NOTE — Progress Notes (Signed)
PT/OT/ST Cancellation Note  ___Treatment cancelled today due to medical issues with patient which prohibited therapy  ___ Treatment cancelled today due to patient receiving procedure or test   _x__ Treatment cancelled today due to patient's refusal to participate. Pt sitting EOB stating "I gotta eat. I'm not ready for that yet." Will check back as schedule permits-may be another day. Thanks  ___ Treatment cancelled today due to   Signature: Weston Anna, PT [phone]

## 2011-04-25 NOTE — Progress Notes (Signed)
Around 2230 took patient to the bathroom. Pt was independent, able to walk to bathroom and use bathroom by herself, was alert and oriented. Got pt back to bed and comfortable, requested apple juice with meds. I was out of the room maybe 5 mins while gathering meds. When I got back in the room and started going over the meds with her I noticed her speech was slurred and she was becoming more lethargic. Arousable to open eyes but was unable to answer my questions. Pt's hr and breathing was stable. Asked pt to squeeze my hands- after many tries she was able to weakly squeeze my hands. Some facial twitching was noted.  Called Dr Quay Burow to come and assess pt. Son states these are her normal "sleeping spells" when asked how long they normally lasted, he said sometimes hours. BP elevated at 175/110 manually. When Dr Quay Burow arrived, pt was slightly more arousable, now able to answer some questions with speech still slurred. She was able to say her head hurt. Dr Quay Burow performed his assessment and we left the room. When I returned and gave pt IV hydralazine, she came around more alert and stated she needed to use the bathroom. I went to get a bedpan (unsure of walking pt to the BR) and when I returned pt was sitting on the side of the bed. 2 RN's helped pt to [facility] and once back in bed Dr Quay Burow came back in to reassess pt. She had no recollection of previous events. HS meds were then given.   Will continue to assess and monitor pt through the night. Monterrius Cardosa, Julieta Gutting RN

## 2011-04-25 NOTE — Progress Notes (Signed)
RT found pt already placed on CPAP for the night at 5 cmH2O.  Pt appears to be tolerating well.  Pt and family encouraged to notify RN/ RT of any concerns.

## 2011-04-25 NOTE — Progress Notes (Addendum)
Nutrition Consult for Education  Chart reviewed.  Pt with hx of sleep apnea.  Diet:  Heart healthy  Ht:  5'4" Wt:  273# BMI=47  Meets Criteria for Extreme Obesity   Attempted education for weight loss, general healthy eating.  "Now is really not a good time for this."  Pt refused further instruction but did allow me to leave written info with RD name and number.    Rec f/u as outpt for education if pt is willing.  Darrol Jump [phone]

## 2011-04-25 NOTE — Progress Notes (Signed)
Portable EEG completed

## 2011-04-25 NOTE — Progress Notes (Addendum)
Patient ID: Tiffany [name], female   DOB: [birth year], 51 y.o.   MRN: [id number]   Interim summary 51 year old female with medical history significant for hypertension, syncopal episodes in past, depression/anxiety who presented to ED with complaints of recurrent syncopal episodes. Patient claims to have family history of seizures and is convinced that her syncopal episodes are secondary to seizures. We have consulted neurology on admission and recommendation was for EEG and MRI of the brain both of which are pending at this time.  CONSULTS TO DATE: 1. Physical therapy 2. Neurology 3. Respiratory therapy  Assessment/Plan   Principal Problem:   *Syncope  - perhaps related to multiple medications for anxiety and depression versus absence seizure versus poorly controlled obstructive sleep apnea - ruled out cardiac arrhythmia : Cardiac enzymes are negative for 3 sets - follow up 2 D ECHO - results pending at this time but the study has been completed - PT evaluation - patient has refused treatments today 04/25/2011 - neurology is following - Please follow up MRI of the brain and EEG results  Active Problems:   Leukocytosis  - unclear etiology; likely reactive  - Slowly resolving - no fever, no obvious source of infection: urinalysis is negative, CXR shows no active cardiopulmonary disease  - hold off on antibiotics   OSA on CPAP  - continue CPAP at night   Asthma  - nebulizer treatments for shortness of breath  - Singulair daily  - continue symbicort   HTN (hypertension)  - continue lisinopril and clonidine  - BP 152/116 - I have added he Trellis and 25 mg every 6 hours for better blood pressure control  DM (diabetes mellitus)  - hold metformin  - 91, 106,120 - sliding scale insulin  - continue CBG monitoring   Diabetic neuropathy  - continue neurontin   Dyslipidemia  - continue simvastatin   Anemia  - hemoglobin stable since admission   Depression with anxiety  -  continue patient's extensive home medication regimen   DVT Prophylaxis - lovenox sub Q   Code Status - full code   Education  - test results and diagnostic studies were discussed with patient and pt's family (son) who was present at the bedside  - patient and family have verbalized the understanding  - questions were answered at the bedside and contact information was provided for additional questions or concerns   Disposition - I anticipated discharge today 04/25/2011 however MRI is not done yet - Perhaps patient can be discharged tomorrow pending the results of the MRI and EEG  Subjective:  No events overnight.   Objective:  Vital signs in last 24 hours:   04/25/11 0708 04/25/11 1331  BP: 187/108 152/116  Pulse: 83 102  Temp: 98.1 F (36.7 C) 97.1 F (36.2 C)  TempSrc: Oral Oral  Resp: 20 20  SpO2: 95% 97%    Intake/Output from previous day:   Gross per 24 hour  Intake    480 ml  Output   3975 ml  Net  -3495 ml    Physical Exam: General: Alert, awake, oriented x3, in no acute distress. HEENT: No bruits, no goiter. Moist mucous membranes, no scleral icterus, no conjunctival pallor; left facial bruises Heart: Regular rate and rhythm, S1/S2 +, no murmurs, rubs, gallops. Lungs: Clear to auscultation bilaterally. No wheezing, no rhonchi, no rales.  Abdomen: Soft, nontender, nondistended, positive bowel sounds. Extremities: No clubbing or cyanosis, no pitting edema,  positive pedal pulses. Neuro: Grossly nonfocal.  Lab  Results:  Lab 04/25/11 0155 04/24/11 1900 04/24/11 1310  WBC 12.5* 13.0* 13.0*  HGB 9.9* 9.8* 9.4*  HCT 31.6* 31.5* 30.4*  PLT 360 356 337  MCV 94.3 94.0 94.7    Lab 04/25/11 0155 04/24/11 1900 04/24/11 1310  NA 140 142 142  K 3.6 4.1 4.0  CL 101 105 108  CO2 32 31 26  GLUCOSE 111* 103* 116*  BUN 9 9 10   CREATININE 1.05 0.92 0.95  CALCIUM 9.3 9.2 8.9    Lab 04/24/11 1900  INR 1.01   Cardiac markers:  Lab 04/25/11 0945 04/25/11  0155 04/24/11 1900  CKMB 3.0 3.2 4.5*  TROPONINI <0.30 <0.30 <0.30    Studies/Results: Ct Head Wo Contrast 04/24/2011   IMPRESSION: No visible facial fracture or acute sinus opacity.  Left malar hematoma.  Slight open mouth position of the mandible without definite TMJ dislocation.  Findings discussed with ED provider.    Dg Chest Portable 1 View 04/24/2011    IMPRESSION: No active cardiopulmonary process.    Ct Maxillofacial Wo Cm 04/24/2011  IMPRESSION: No visible facial fracture or acute sinus opacity.  Left malar hematoma.  Slight open mouth position of the mandible without definite TMJ dislocation.  Findings discussed with ED provider.     Medications: Scheduled Meds:   . acyclovir ointment  1 application Topical TID  . atorvastatin  40 mg Oral Daily  . budesonide-formoterol  2 puff Inhalation BID  . buPROPion  150 mg Oral BID  . cloNIDine  0.2 mg Oral TID  . DULoxetine  60 mg Oral BID  . enoxaparin  injection  60 mg Subcutaneous Q24H  . fluticasone  2 spray Each Nare Daily  . furosemide  40 mg Oral Daily  . gabapentin  300 mg Oral TID  . hydrALAZINE  25 mg Oral Q6H  . insulin aspart  0-15 Units Subcutaneous TID WC  . insulin aspart  0-5 Units Subcutaneous QHS  . lisdexamfetamine  70 mg Oral Q breakfast  . lisinopril  20 mg Oral BID  . loratadine  10 mg Oral Daily  . LORazepam  1 mg Intravenous Once  . meloxicam  7.5 mg Oral Daily  . montelukast  10 mg Oral QHS  . oxybutynin  5 mg Oral TID  . pantoprazole  40 mg Oral Q1200  . traMADol  50 mg Oral TID  . valACYclovir  500 mg Oral BID  . venlafaxine  75 mg Oral Daily   Continuous Infusions:  PRN Meds:.albuterol, clonazePAM, cyclobenzaprine, hydrALAZINE, morphine injection, ondansetron (ZOFRAN) IV, ondansetron   LOS: 1 day   Ashlyne Olenick 04/25/2011, 4:38 PM  [facility] Pager: [phone]

## 2011-04-25 NOTE — Progress Notes (Signed)
  Echocardiogram 2D Echocardiogram has been performed.  Tiffany [name] 04/25/2011, 12:31 PM

## 2011-04-25 NOTE — Progress Notes (Signed)
Patient was admitted for syncope and arm pain. She has clear diminished breath sounds and does not require any oxygen. RT will change patient to her home regimen. Symbicort BID and albuterol MDI Q4 PRN.

## 2011-04-26 LAB — GLUCOSE, CAPILLARY
Glucose-Capillary: 121 mg/dL — ABNORMAL HIGH (ref 70–99)
Glucose-Capillary: 122 mg/dL — ABNORMAL HIGH (ref 70–99)
Glucose-Capillary: 102 mg/dL — ABNORMAL HIGH (ref 70–99)
Glucose-Capillary: 114 mg/dL — ABNORMAL HIGH (ref 70–99)

## 2011-04-26 MED ORDER — GABAPENTIN 100 MG PO CAPS
200.0000 mg | ORAL_CAPSULE | Freq: Three times a day (TID) | ORAL | Status: DC
  Administered 2011-04-26 – 2011-04-27 (×4): 200 mg via ORAL
  Filled 2011-04-26 (×8): qty 2

## 2011-04-26 MED ORDER — REGADENOSON 0.4 MG/5ML IV SOLN
0.4000 mg | Freq: Once | INTRAVENOUS | Status: DC
  Filled 2011-04-26: qty 5

## 2011-04-26 MED ORDER — FUROSEMIDE 40 MG PO TABS
60.0000 mg | ORAL_TABLET | Freq: Every day | ORAL | Status: DC
  Administered 2011-04-27: 60 mg via ORAL
  Filled 2011-04-26 (×2): qty 1

## 2011-04-26 NOTE — Evaluation (Signed)
Physical Therapy Evaluation Patient Details Name: Tiffany [name] MRN: [id number] DOB: 1961-01-07 Today's Date: 04/26/2011  Problem List:  Patient Active Problem List  Diagnoses  . OSA on CPAP  . Asthma  . Obesity  . HTN (hypertension)  . DM (diabetes mellitus)  . Syncope  . Dyslipidemia  . Anemia  . Depression with anxiety  . Leukocytosis  . Diabetic neuropathy    Past Medical History:  Past Medical History  Diagnosis Date  . High blood pressure   . Incontinence   . Anxiety   . Manic depression   . Heart attack   . Diabetes mellitus     class 2  . Asthma   . Emphysema   . High cholesterol   . Stroke   . Sinus trouble   . Seasonal allergies   . Sleep apnea   . Disorder of nervous system   . Myocardial infarct   . Complication of anesthesia     decreased bp, decreased heart rate   Past Surgical History:  Past Surgical History  Procedure Date  . Abdominal hysterectomy   . Cesarean section   . Hiatal hernia repair     PT Assessment/Plan/Recommendation PT Assessment Clinical Impression Statement: Pt presents with diagnosis of syncope. Both pt and nurse tech report pt has been up in room ambulating without device or assistance. However, during PT eval-pt c/o dizziness and performed as if she could not tolerate activity. Pt became tearful during session discussing family situation while resting, then pt was laughing once returned to room.  Pt is mobilizing at supervision level with RW during PT eval and independently in room per nurse tech and pt's own admission. Do not feel pt has any PT needs at this time. 1x eval. Recommend home safety evaluation at discharge. Pt also stated she is arranging to have nurse/aide come out to home  beginning on Apr 15/16th.? PT Recommendation/Assessment: Patent does not need any further PT services No Skilled PT: Patient is supervision for all activity/mobility PT Recommendation Follow Up Recommendations: No PT follow up; Home Safety  Evaluation Equipment Recommended: None recommended by PT PT Goals     PT Evaluation Precautions/Restrictions  Precautions Precautions: Fall Precaution Comments: fall risk due to pt's unpredictability Prior Functioning  Home Living Lives With: Clarene Duke Help From: Family Type of Home: House Home Layout: One level Home Access: Stairs to enter Entrance Stairs-Rails: Right Home Adaptive Equipment: Walker - rolling;Straight cane Additional Comments: pt states MD recommended she use RW instead of cane Prior Function Level of Independence: Requires assistive device for independence;Independent with basic ADLs;Independent with transfers;Independent with gait Household ambulation only per pt.  Cognition Cognition Arousal/Alertness: Awake/alert Overall Cognitive Status: Appears within functional limits for tasks assessed Orientation Level: Oriented to person;Oriented to place;Oriented to situation Cognition - Other Comments: pt asked therapist what day it was Sensation/Coordination Sensation Light Touch: Appears Intact Coordination Gross Motor Movements are Fluid and Coordinated: Yes Extremity Assessment RLE Assessment RLE Assessment: Within Functional Limits LLE Assessment LLE Assessment: Within Functional Limits Mobility (including Balance) Bed Mobility Bed Mobility: Yes Supine to Sit: 7: Independent Sit to Supine: 7: Independent Transfers Transfers: Yes Sit to Stand: 5: Supervision Stand to Sit: 5: Supervision Ambulation/Gait Ambulation/Gait: Yes Ambulation/Gait Assistance: 5: Supervision Ambulation/Gait Assistance Details (indicate cue type and reason): Slow gait speed. Pt c/o dizziness during ambulation. Seated rest break until pt felt she could return back to room.  Ambulation Distance (Feet): 60 Feet (30'x2) Assistive device: Rolling walker Gait Pattern: Decreased stride  length;Step-through pattern  Posture/Postural Control Posture/Postural Control: No  significant limitations Balance Balance Assessed: No (no LOB with activity) Exercise    End of Session PT - End of Session Equipment Utilized During Treatment: Gait belt Activity Tolerance:  (Limited by pt c/o dizziness) Patient left: in bed;with call bell in reach;with family/visitor present General Behavior During Session: [facility] for tasks performed Cognition: [facility] for tasks performed  Weston Anna [facility] 04/26/2011, 9:38 AM [phone]

## 2011-04-26 NOTE — Progress Notes (Signed)
Subjective: Anxious , wants her meds to be reviwed  Complains of chest pain prior to syncopal holder  Objective: Vital signs in last 24 hours: Filed Vitals:   04/25/11 2145 04/26/11 0534 04/26/11 0922 04/26/11 0949  BP: 116/74 150/98 155/92   Pulse: 77 73    Temp: 98.2 F (36.8 C) 97.8 F (36.6 C)    TempSrc: Oral Axillary    Resp: 20 20    Height:      Weight:  124.013 kg (273 lb 6.4 oz)    SpO2: 94% 93%  93%    Intake/Output Summary (Last 24 hours) at 04/26/11 1030 Last data filed at 04/26/11 [id number]  Gross per 24 hour  Intake    483 ml  Output    650 ml  Net   -167 ml    Weight change: -3.493 kg (-7 lb 11.2 oz)  General: Alert, awake, oriented x3, in no acute distress.  HEENT: No bruits, no goiter. Moist mucous membranes, no scleral icterus, no conjunctival pallor; left facial bruises  Heart: Regular rate and rhythm, S1/S2 +, no murmurs, rubs, gallops.  Lungs: Clear to auscultation bilaterally. No wheezing, no rhonchi, no rales.  Abdomen: Soft, nontender, nondistended, positive bowel sounds.  Extremities: No clubbing or cyanosis, no pitting edema, positive pedal pulses.  Neuro: Grossly nonfocal.      Lab Results: Results for orders placed during the hospital encounter of 04/24/11 (from the past 24 hour(s))  GLUCOSE, CAPILLARY     Status: Normal   Collection Time   04/25/11 12:01 PM      Component Value Range   Glucose-Capillary 91  70 - 99 (mg/dL)  GLUCOSE, CAPILLARY     Status: Abnormal   Collection Time   04/25/11  4:36 PM      Component Value Range   Glucose-Capillary 124 (*) 70 - 99 (mg/dL)  GLUCOSE, CAPILLARY     Status: Abnormal   Collection Time   04/25/11  9:41 PM      Component Value Range   Glucose-Capillary 111 (*) 70 - 99 (mg/dL)   Comment 1 Documented in Chart     Comment 2 Notify RN    GLUCOSE, CAPILLARY     Status: Abnormal   Collection Time   04/26/11  7:34 AM      Component Value Range   Glucose-Capillary 121 (*) 70 - 99 (mg/dL)      Micro: Recent Results (from the past 240 hour(s))  URINE CULTURE     Status: Normal   Collection Time   04/24/11  4:31 PM      Component Value Range Status Comment   Specimen Description URINE, CLEAN CATCH   Final    Special Requests NONE   Final    Culture  Setup Time [id number]   Final    Colony Count >=100,000 COLONIES/ML   Final    Culture     Final    Value: Multiple bacterial morphotypes present, none predominant. Suggest appropriate recollection if clinically indicated.   Report Status 04/25/2011 FINAL   Final     Studies/Results: Ct Head Wo Contrast  04/24/2011  *RADIOLOGY REPORT*  Clinical Data:  [place] 3 days ago, headache and facial pain.  CT HEAD WITHOUT CONTRAST CT MAXILLOFACIAL WITHOUT CONTRAST  Technique:  Multidetector CT imaging of the head and maxillofacial structures were performed using the standard protocol without intravenous contrast. Multiplanar CT image reconstructions of the maxillofacial structures were also generated.  Comparison:  None.  CT HEAD  Findings:  There is no evidence for acute infarction, intracranial hemorrhage, mass lesion, hydrocephalus, or extra-axial fluid. There is no atrophy or white matter disease.  Calvarium is intact. Clear paranasal sinuses.  Minimal dependent fluid left mastoid not clearly acute.  IMPRESSION: Negative exam.  CT MAXILLOFACIAL  Findings:   There is no visible facial fracture or acute sinus opacity.  Significant bruising is seen in the malar region on the left.  There are multiple teeth missing which is a chronic finding. The mandibular condyles are slightly anteriorly displaced in an open mouth type configuration of the TMJ, but I do not definitely see a mandibular fracture or TMJ dislocation. Slight double density of right TMJ appears to not represent a fracture but patient movement.  IMPRESSION: No visible facial fracture or acute sinus opacity.  Left malar hematoma.  Slight open mouth position of the mandible without definite  TMJ dislocation.  Findings discussed with ED provider.  Original Report Authenticated By: Staci Righter, M.D.   Mr Brain Wo Contrast  04/26/2011  *RADIOLOGY REPORT*  Clinical Data: Fall.  Headache and facial pain.  Rule out stroke  MRI HEAD WITHOUT CONTRAST  Technique:  Multiplanar, multiecho pulse sequences of the brain and surrounding structures were obtained according to standard protocol without intravenous contrast.  Comparison: CT 04/24/2011  Findings: Image quality degraded by motion and large patient size.  Negative for acute infarct.  Several small white matter hyperintensities bilaterally which may represent chronic ischemia. Negative for hemorrhage or mass lesion.  No edema is present. Negative for subdural fluid collection.  Paranasal sinuses are clear.  IMPRESSION: No acute abnormality.  Mild chronic white matter disease.  Original Report Authenticated By: Truett Perna, M.D.   Dg Chest Portable 1 View  04/24/2011  *RADIOLOGY REPORT*  Clinical Data: Chest and left arm pain.  Syncopal episodes.  PORTABLE CHEST - 1 VIEW  Comparison: 01/15/2011 and 10/03/2010.  Findings: 1319 hours.  Examination was repeated.  Heart size and mediastinal contours are stable.  The lung bases are incompletely penetrated on the initial view.  On the second examination, the lung bases appear clear and no significant pleural effusion is identified.  There is no pneumothorax.  IMPRESSION: No active cardiopulmonary process.  Original Report Authenticated By: Vivia Ewing, M.D.   Ct Maxillofacial Wo Cm  04/24/2011  *RADIOLOGY REPORT*  Clinical Data:  [place] 3 days ago, headache and facial pain.  CT HEAD WITHOUT CONTRAST CT MAXILLOFACIAL WITHOUT CONTRAST  Technique:  Multidetector CT imaging of the head and maxillofacial structures were performed using the standard protocol without intravenous contrast. Multiplanar CT image reconstructions of the maxillofacial structures were also generated.  Comparison:  None.  CT HEAD   Findings: There is no evidence for acute infarction, intracranial hemorrhage, mass lesion, hydrocephalus, or extra-axial fluid. There is no atrophy or white matter disease.  Calvarium is intact. Clear paranasal sinuses.  Minimal dependent fluid left mastoid not clearly acute.  IMPRESSION: Negative exam.  CT MAXILLOFACIAL  Findings:   There is no visible facial fracture or acute sinus opacity.  Significant bruising is seen in the malar region on the left.  There are multiple teeth missing which is a chronic finding. The mandibular condyles are slightly anteriorly displaced in an open mouth type configuration of the TMJ, but I do not definitely see a mandibular fracture or TMJ dislocation. Slight double density of right TMJ appears to not represent a fracture but patient movement.  IMPRESSION: No visible facial fracture or acute sinus opacity.  Left malar hematoma.  Slight open mouth position of the mandible without definite TMJ dislocation.  Findings discussed with ED provider.  Original Report Authenticated By: Staci Righter, M.D.    Medications:  Scheduled Meds:   . acyclovir ointment  1 application Topical TID  . atorvastatin  40 mg Oral Daily  . budesonide-formoterol  2 puff Inhalation BID  . buPROPion  150 mg Oral BID  . cloNIDine  0.2 mg Oral TID  . DULoxetine  60 mg Oral BID  . enoxaparin (LOVENOX) injection  60 mg Subcutaneous Q24H  . fluticasone  2 spray Each Nare Daily  . furosemide  40 mg Oral Daily  . gabapentin  300 mg Oral TID  . hydrALAZINE  25 mg Oral Q6H  . insulin aspart  0-15 Units Subcutaneous TID WC  . insulin aspart  0-5 Units Subcutaneous QHS  . lisdexamfetamine  70 mg Oral Q breakfast  . lisinopril  20 mg Oral BID  . loratadine  10 mg Oral Daily  . LORazepam  1 mg Intravenous Once  . meloxicam  7.5 mg Oral Daily  . montelukast  10 mg Oral QHS  . oxybutynin  5 mg Oral TID  . pantoprazole  40 mg Oral Q1200  . regadenoson  0.4 mg Intravenous Once  . sodium chloride  3  mL Intravenous Q12H  . traMADol  50 mg Oral TID  . valACYclovir  500 mg Oral BID  . venlafaxine  75 mg Oral Daily  . DISCONTD: enoxaparin  30 mg Subcutaneous Q24H   Continuous Infusions:  PRN Meds:.albuterol, clonazePAM, cyclobenzaprine, hydrALAZINE, morphine injection, ondansetron (ZOFRAN) IV, ondansetron   Assessment: Principal Problem:  *Syncope Active Problems:  OSA on CPAP  Asthma  HTN (hypertension)  DM (diabetes mellitus)  Dyslipidemia  Anemia  Depression with anxiety  Leukocytosis  Diabetic neuropathy   Plan: *Syncope  - perhaps related to multiple medications for anxiety and depression versus absence seizure versus poorly controlled obstructive sleep apnea , may have underlying CAD., History of MI in 2000 and 2001, consult cardiology for possible stress test, also related to sleep apnea Telemetry negative: Cardiac enzymes are negative for 3 sets  - follow up 2 D ECHO -normal at this time but the study has been completed  - PT evaluation - patient has refused treatments today 04/25/2011  - neurology is following  MRI negative, EEG pending if EEG is normal and continues to have spells may need prolonged EEG with simultaneous video recording at [facility]  No change in her antiepileptic medication at this time Cardiology consult Dr Wynonia Lawman has been notified  Active Problems:  Leukocytosis  - unclear etiology; likely reactive , no signs of infection  - Slowly resolving  - no fever, no obvious source of infection: urinalysis is negative, CXR shows no active cardiopulmonary disease  - hold off on antibiotics    OSA on CPAP  - continue CPAP at night , will need an outpatient sleep study again   Asthma  - nebulizer treatments for shortness of breath  - Singulair daily  - continue symbicort    HTN (hypertension)  - continue lisinopril and clonidine  - BP 152/116  - I have added he Trellis and 25 mg every 6 hours for better blood pressure control    DM  (diabetes mellitus)  - hold metformin  - 91, 106,120  - sliding scale insulin  - continue CBG monitoring    Diabetic neuropathy  - continue neurontin on a taper  dose due to concern about polypharmacy  Dyslipidemia  - continue simvastatin  Anemia  - hemoglobin stable since admission  Depression with anxiety  - continue patient's extensive home medication regimen  DVT Prophylaxis - lovenox sub Q  Code Status - full code  Education  - test results and diagnostic studies were discussed with patient and pt's family (son) who was present at the bedside  - patient and family have verbalized the understanding  - questions were answered at the bedside and contact information was provided for additional questions or concerns    PT evaluation today    LOS: 2 days   [facility] 04/26/2011, 10:30 AM

## 2011-04-26 NOTE — Progress Notes (Signed)
[facility] PROGRESS NOTE    SUBJECTIVE   No further activity over night.   OBJECTIVE   Vital signs in last 24 hours: Temp:  [97.1 F (36.2 C)-98.2 F (36.8 C)] 97.8 F (36.6 C) (04/10 0534) Pulse Rate:  [73-102] 73  (04/10 0534) Resp:  [20] 20  (04/10 0534) BP: (116-152)/(74-116) 150/98 mmHg (04/10 0534) SpO2:  [93 %-97 %] 93 % (04/10 0534) Weight:  [124.013 kg (273 lb 6.4 oz)] 124.013 kg (273 lb 6.4 oz) (04/10 0534)  Intake/Output from previous day: 04/09 0701 - 04/10 0700 In: 723 [P.O.:720; I.V.:3] Out: 1325 [Urine:1325] Intake/Output this shift:   Nutritional status: Cardiac  Past Medical History  Diagnosis Date  . High blood pressure   . Incontinence   . Anxiety   . Manic depression   . Heart attack   . Diabetes mellitus     class 2  . Asthma   . Emphysema   . High cholesterol   . Stroke   . Sinus trouble   . Seasonal allergies   . Sleep apnea   . Disorder of nervous system   . Myocardial infarct   . Complication of anesthesia     decreased bp, decreased heart rate    Neurologic Exam:   Mental Status: Alert, oriented, thought content appropriate.  Speech fluent without evidence of aphasia. Able to follow 3 step commands without difficulty. Standing and brushing her teeth Cranial Nerves: II-Visual fields grossly intact. III/IV/VI-Extraocular movements intact.  Pupils reactive bilaterally. V/VII-Smile symmetric VIII-grossly intact IX/X-normal gag XI-bilateral shoulder shrug XII-midline tongue extension Motor: 5/5 bilaterally with normal tone and bulk Sensory: Pinprick and light touch intact throughout, bilaterally Deep Tendon Reflexes: 2+ and symmetric throughout Plantars: Downgoing bilaterally Cerebellar: Normal finger-to-nose, normal rapid alternating movements and normal heel-to-shin test.  Normal gait and station.  Lab Results: No results found for this basename: cbc, bmp, coags, chol, tri,  ldl, hga1c   Lipid Panel No results found for this basename: CHOL,TRIG,HDL,CHOLHDL,VLDL,LDLCALC in the last 72 hours  Studies/Results: Ct Head Wo Contrast  04/24/2011  *RADIOLOGY REPORT*  Clinical Data:  [place] 3 days ago, headache and facial pain.  CT HEAD WITHOUT CONTRAST CT MAXILLOFACIAL WITHOUT CONTRAST  Technique:  Multidetector CT imaging of the head and maxillofacial structures were performed using the standard protocol without intravenous contrast. Multiplanar CT image reconstructions of the maxillofacial structures were also generated.  Comparison:  None.  CT HEAD  Findings: There is no evidence for acute infarction, intracranial hemorrhage, mass lesion, hydrocephalus, or extra-axial fluid. There is no atrophy or white matter disease.  Calvarium is intact. Clear paranasal sinuses.  Minimal dependent fluid left mastoid not clearly acute.  IMPRESSION: Negative exam.  CT MAXILLOFACIAL  Findings:   There is no visible facial fracture or acute sinus opacity.  Significant bruising is seen in the malar region on the left.  There are multiple teeth missing which is a chronic finding. The mandibular condyles are slightly anteriorly displaced in an open mouth type configuration of the TMJ, but I do not definitely see a mandibular fracture or TMJ dislocation. Slight double density of right TMJ appears to not represent a fracture but patient movement.  IMPRESSION: No visible facial fracture or acute sinus opacity.  Left malar hematoma.  Slight open mouth  position of the mandible without definite TMJ dislocation.  Findings discussed with ED provider.  Original Report Authenticated By: Staci Righter, M.D.   Mr Brain Wo Contrast  04/26/2011  *RADIOLOGY REPORT*  Clinical Data: Fall.  Headache and facial pain.  Rule out stroke  MRI HEAD WITHOUT CONTRAST  Technique:  Multiplanar, multiecho pulse sequences of the brain and surrounding structures were obtained according to standard protocol without intravenous  contrast.  Comparison: CT 04/24/2011  Findings: Image quality degraded by motion and large patient size.  Negative for acute infarct.  Several small white matter hyperintensities bilaterally which may represent chronic ischemia. Negative for hemorrhage or mass lesion.  No edema is present. Negative for subdural fluid collection.  Paranasal sinuses are clear.  IMPRESSION: No acute abnormality.  Mild chronic white matter disease.  Original Report Authenticated By: Truett Perna, M.D.   Dg Chest Portable 1 View  04/24/2011  *RADIOLOGY REPORT*  Clinical Data: Chest and left arm pain.  Syncopal episodes.  PORTABLE CHEST - 1 VIEW  Comparison: 01/15/2011 and 10/03/2010.  Findings: 1319 hours.  Examination was repeated.  Heart size and mediastinal contours are stable.  The lung bases are incompletely penetrated on the initial view.  On the second examination, the lung bases appear clear and no significant pleural effusion is identified.  There is no pneumothorax.  IMPRESSION: No active cardiopulmonary process.  Original Report Authenticated By: Vivia Ewing, M.D.   Ct Maxillofacial Wo Cm  04/24/2011  *RADIOLOGY REPORT*  Clinical Data:  [place] 3 days ago, headache and facial pain.  CT HEAD WITHOUT CONTRAST CT MAXILLOFACIAL WITHOUT CONTRAST  Technique:  Multidetector CT imaging of the head and maxillofacial structures were performed using the standard protocol without intravenous contrast. Multiplanar CT image reconstructions of the maxillofacial structures were also generated.  Comparison:  None.  CT HEAD  Findings: There is no evidence for acute infarction, intracranial hemorrhage, mass lesion, hydrocephalus, or extra-axial fluid. There is no atrophy or white matter disease.  Calvarium is intact. Clear paranasal sinuses.  Minimal dependent fluid left mastoid not clearly acute.  IMPRESSION: Negative exam.  CT MAXILLOFACIAL  Findings:   There is no visible facial fracture or acute sinus opacity.  Significant bruising  is seen in the malar region on the left.  There are multiple teeth missing which is a chronic finding. The mandibular condyles are slightly anteriorly displaced in an open mouth type configuration of the TMJ, but I do not definitely see a mandibular fracture or TMJ dislocation. Slight double density of right TMJ appears to not represent a fracture but patient movement.  IMPRESSION: No visible facial fracture or acute sinus opacity.  Left malar hematoma.  Slight open mouth position of the mandible without definite TMJ dislocation.  Findings discussed with ED provider.  Original Report Authenticated By: Staci Righter, M.D.   EEG - pending  Medications:     Scheduled:   . acyclovir ointment  1 application Topical TID  . atorvastatin  40 mg Oral Daily  . budesonide-formoterol  2 puff Inhalation BID  . buPROPion  150 mg Oral BID  . cloNIDine  0.2 mg Oral TID  . DULoxetine  60 mg Oral BID  . enoxaparin (LOVENOX) injection  60 mg Subcutaneous Q24H  . fluticasone  2 spray Each Nare Daily  . furosemide  40 mg Oral Daily  . gabapentin  300 mg Oral TID  . hydrALAZINE  25 mg Oral Q6H  . insulin aspart  0-15 Units Subcutaneous  TID WC  . insulin aspart  0-5 Units Subcutaneous QHS  . lisdexamfetamine  70 mg Oral Q breakfast  . lisinopril  20 mg Oral BID  . loratadine  10 mg Oral Daily  . LORazepam  1 mg Intravenous Once  . meloxicam  7.5 mg Oral Daily  . montelukast  10 mg Oral QHS  . oxybutynin  5 mg Oral TID  . pantoprazole  40 mg Oral Q1200  . sodium chloride  3 mL Intravenous Q12H  . traMADol  50 mg Oral TID  . valACYclovir  500 mg Oral BID  . venlafaxine  75 mg Oral Daily  . DISCONTD: enoxaparin  30 mg Subcutaneous Q24H    Assessment/Plan:   51 YO female with sleep apnea, history of stroke and periods of AMS not associated with TC movement, incontinence or tongue biting. Differential includes possible seizure activity, for which we will obtain MRI and EEG, versus sleep apnea or syncope.   Recommend:  1) MRI brain --normal 2) EEG --pending 3) if EEG is normal and continues to have spells may need prolonged EEG with simultaneous video recording at [facility]  4) would not add further AED unless this EEG is abnormal indicating seizure disorder or patient has an unequivocal weakness seizure.  Neurology will S/O  Etta Quill PA-C [facility] [phone]  04/26/2011, 8:08 AM

## 2011-04-26 NOTE — Progress Notes (Signed)
Pt arrived to floor from ED. I entered the room and introduced myself by name and title and told pt I would be caring for her. I asked pt to turn onto her side and stated that I needed to "look at her backside" for my assessment. Pt turned on her side and seemed to be cooperative. However, when I touched the edge of her panties to pull them to the side to assess her buttocks per my required admission assessment, the pt became enraged. She began yelling at me and putting her hands in my face, she then proceeded to jump out of the bed and push me and screaming me for me to leave the room. I tried to explain to the pt that I was leaving the room, but that I had to put her bed down for safety, but she was still yelling in my face and pushing me. I left the room immediately and my charge nurse entered the room along with security to help calm pt down. The charge nurse did all of the admission work and cared for the pt for the rest of my shift.   Othella Boyer [place] 04/24/2011

## 2011-04-26 NOTE — Progress Notes (Deleted)
Spoke with pt in length concerning PCP. Pt explained that she was not going back to PCP she had prior to this admission.  Pt states that her car is at new PCP office, where she was making an appointment before coming to ED. Pt states, at discharge she will make an appointment when she pick her car up. [facility] was called, however pt wants to wait. Documentation did not reveal PCP or MD office prior to ED admission. Pt states she has CPAP machine at home, along with home [place]. mp

## 2011-04-26 NOTE — Progress Notes (Signed)
Pt has CPAP machine along with [facility]. mp

## 2011-04-26 NOTE — Consult Note (Signed)
Admit date: 04/24/2011 Referring Physician  : Dr. Allyson Sabal Primary Physician DEFAULT,PROVIDER, MD, MD Primary Cardiologist  None Reason for Consultation  syncope  HPI: 51 year old female with diabetes, hyperlipidemia, prior stroke approximately in the year 2000, prior mild heart attack also in the year 2000 in [place] with cardiac catheterization that she states was reassuring here in the hospital for evaluation of syncope. She has extensive ecchymosis on the left side of her face from where she fell. She does not remember the act of falling but she does remember waking up immediately after. She does not recall being diaphoretic, nauseous. She has had a few episodes similar to this which her family has witnessed. She states that she will begin to slur her words, garble her speech, become confused and her family sometimes needs to help her down to the floor.  Currently does not describe any chest discomfort, although on admission her chest wall was uncomfortable after sustaining a fall. Originally per history of present illness it was 7/10 in intensity with no other associated factors. It may have radiated to her left leg and occasionally to her left arm. I questioned her about her chest discomfort she states that she no longer has this. She does state however that she becomes short of breath/anxious/panicy quite easily, for instance while working with physical therapy today she felt winded as though her asthma were acting up.  She states that a few years ago she did undergo a cardiac catheterization but this was a reassuring study. No prior stents. She also states that she was "allergic to anesthesia "can you to have several cardiac studies done to make sure that the machines her alive.  An echocardiogram was performed on this admission and reassuring with normal ejection fraction and no wall motion abnormalities, personally viewed. Her cardiac markers were also reassuring with negative troponin. Her BNP was  slightly elevated at 159 however she is morbidly obese and this may be indicative of mild diastolic dysfunction.    PMH:   Past Medical History  Diagnosis Date  . High blood pressure   . Incontinence   . Anxiety   . Manic depression   . Heart attack   . Diabetes mellitus     class 2  . Asthma   . Emphysema   . High cholesterol   . Stroke   . Sinus trouble   . Seasonal allergies   . Sleep apnea   . Disorder of nervous system   . Myocardial infarct   . Complication of anesthesia     decreased bp, decreased heart rate    PSH:   Past Surgical History  Procedure Date  . Abdominal hysterectomy   . Cesarean section   . Hiatal hernia repair    Allergies:  Codeine and Sulfa antibiotics Prior to Admit Meds:   Prescriptions prior to admission  Medication Sig Dispense Refill  . acyclovir ointment (ZOVIRAX) 5 % Apply 1 application topically 3 (three) times daily.      [name] albuterol (PROVENTIL HFA;VENTOLIN HFA) 108 (90 BASE) MCG/ACT inhaler Inhale 2 puffs into the lungs every 6 (six) hours as needed.      [name] albuterol (PROVENTIL) (2.5 MG/3ML) 0.083% nebulizer solution Take 2.5 mg by nebulization every 6 (six) hours as needed. As needed for shortness of breath.      [name] atorvastatin (LIPITOR) 40 MG tablet Take 40 mg by mouth daily.      . budesonide-formoterol (SYMBICORT) 160-4.5 MCG/ACT inhaler Inhale 2 puffs into the lungs 2 (  two) times daily.        [name] buPROPion (WELLBUTRIN SR) 150 MG 12 hr tablet Take 150 mg by mouth 2 (two) times daily.       . clonazePAM (KLONOPIN) 1 MG tablet Take 1 mg by mouth 3 (three) times daily as needed. anxiety       . cloNIDine (CATAPRES) 0.2 MG tablet Take 0.2 mg by mouth 3 (three) times daily.        . cyclobenzaprine (FLEXERIL) 10 MG tablet Take 10 mg by mouth 3 (three) times daily as needed. Neck/shoulder pain.      . DULoxetine (CYMBALTA) 60 MG capsule Take 60 mg by mouth 2 (two) times daily.        . fluticasone (FLONASE) 50 MCG/ACT nasal spray Place 2  sprays into the nose daily.      . furosemide (LASIX) 40 MG tablet Take 40 mg by mouth daily.      [name] gabapentin (NEURONTIN) 300 MG capsule Take 300 mg by mouth 3 (three) times daily.       [name] glucose blood test strip 1 each by Other route as needed. Use as instructed       . Lancets (ACCU-CHEK MULTICLIX) lancets 1 each by Other route as needed. Use as instructed       . lisdexamfetamine (VYVANSE) 70 MG capsule Take 70 mg by mouth every morning.        [name] lisinopril (PRINIVIL,ZESTRIL) 20 MG tablet Take 20 mg by mouth 2 (two) times daily.        [name] loratadine (CLARITIN) 10 MG tablet Take 10 mg by mouth daily.        . meloxicam (MOBIC) 7.5 MG tablet Take 1 tablet (7.5 mg total) by mouth daily.  14 tablet  0  . metFORMIN (GLUMETZA) 1000 MG (MOD) 24 hr tablet Take 1,000 mg by mouth daily with breakfast.        . montelukast (SINGULAIR) 10 MG tablet Take 10 mg by mouth at bedtime.        [name] omeprazole (PRILOSEC) 40 MG capsule Take 40 mg by mouth daily.        [name] oxybutynin (DITROPAN) 5 MG tablet Take 5 mg by mouth 3 (three) times daily.        . ranitidine (ZANTAC) 300 MG tablet Take 300 mg by mouth 2 (two) times daily.        . traMADol (ULTRAM) 50 MG tablet Take 50 mg by mouth 3 (three) times daily.       . valACYclovir (VALTREX) 500 MG tablet Take 500 mg by mouth 2 (two) times daily.      [name] venlafaxine (EFFEXOR) 75 MG tablet Take 75 mg by mouth daily.         Fam HX:    Family History  Problem Relation Age of Onset  . Cancer Father     prostate  . Heart failure      cousin  . Cancer Mother     lung   Social HX:    History   Social History  . Marital Status: Widowed    Spouse Name: N/A    Number of Children: 3  . Years of Education: N/A   Occupational History  . disabled     factory production   Social History Main Topics  . Smoking status: Former Smoker    Types: Cigarettes    Quit date: 04/23/1977  . Smokeless tobacco: Never Used  . Alcohol Use: No  .  Drug Use: No  .  Sexually Active: No   Other Topics Concern  . Not on file   Social History Narrative  . No narrative on file     ROS:  Denies any recent fevers, rashes, dysphasia, bleeding. Ecchymosis noted on face, recent chest pain episode noted, shortness of breath, anxiety. Unless specified above all other review of systems negative  Physical Exam: Blood pressure 131/85, pulse 90, temperature 98.1 F (36.7 C), temperature source Oral, resp. rate 20, height 5\' 4"  (1.626 m), weight 124.013 kg (273 lb 6.4 oz), SpO2 95.00%.    General: Overweight, lying in bed, in no acute distress Head: Eyes PERRLA, No xanthomas. Large left-sided facial ecchymosis noted  Lungs:   Clear bilaterally to auscultation. Normal respiratory effort. No wheezes, no rales. Heart:   HRRR S1 S2 Pulses are 2+ & equal. No appreciable murmurs            No carotid bruit. No JVD.  No abdominal bruits. No femoral bruits. Abdomen: Bowel sounds are positive, abdomen soft and non-tender without masses. Obese  Msk:  Back normal, gait not assessed. Normal strength and tone for age. Extremities:   No clubbing, cyanosis or edema.  DP +1 Neuro: Alert and oriented X 3, non-focal, MAE x 4 GU: Deferred Rectal: Deferred Psych:  Good affect, responds appropriately, not appear anxious currently    Labs:   Lab Results  Component Value Date   WBC 12.5* 04/25/2011   HGB 9.9* 04/25/2011   HCT 31.6* 04/25/2011   MCV 94.3 04/25/2011   PLT 360 04/25/2011    Lab 04/25/11 0155  NA 140  K 3.6  CL 101  CO2 32  BUN 9  CREATININE 1.05  CALCIUM 9.3  PROT 6.3  BILITOT 0.2*  ALKPHOS 161*  ALT 36*  AST 21  GLUCOSE 111*   No results found for this basename: PTT   Lab Results  Component Value Date   INR 1.01 04/24/2011   Lab Results  Component Value Date   CKTOTAL 181* 04/25/2011   CKMB 3.0 04/25/2011   TROPONINI <0.30 04/25/2011      Radiology:  Mr Brain Wo Contrast  04/26/2011  *RADIOLOGY REPORT*  Clinical Data: Fall.  Headache and facial pain.   Rule out stroke  MRI HEAD WITHOUT CONTRAST  Technique:  Multiplanar, multiecho pulse sequences of the brain and surrounding structures were obtained according to standard protocol without intravenous contrast.  Comparison: CT 04/24/2011  Findings: Image quality degraded by motion and large patient size.  Negative for acute infarct.  Several small white matter hyperintensities bilaterally which may represent chronic ischemia. Negative for hemorrhage or mass lesion.  No edema is present. Negative for subdural fluid collection.  Paranasal sinuses are clear.  IMPRESSION: No acute abnormality.  Mild chronic white matter disease.  Original Report Authenticated By: Truett Perna, M.D.    CXR - NAD - personally viewed  EKG:  Normal sinus rhythm, QT interval 400 ms, no ST segment changes Personally viewed.  TELE: The past 48 hours were reviewed on telemetry monitoring and were normal. There were no pauses, no ventricular tachycardia.  Echo: - Left ventricle: The cavity size was normal. Wall thickness was increased in a pattern of mild LVH. There was mild concentric hypertrophy. Systolic function was normal. The estimated ejection fraction was in the range of 55% to 60%. Wall motion was normal; there were no regional wall motion abnormalities. Doppler parameters are consistent with abnormal left ventricular relaxation (grade 1 diastolic  dysfunction). - Mitral valve: Mildly thickened leaflets . Mild regurgitation. - Atrial septum: No defect or patent foramen ovale was identified. - Tricuspid valve: Mild regurgitation.   ASSESSMENT/PLAN:   51 year old female with unexplained syncope with comorbidities of morbid obesity, diabetes, hyperlipidemia, prior stroke in 2000 and with possible common elevation at that time-reassuring cardiac catheterization.  Syncope - currently telemetry is reassuring, EKG is reassuring, echocardiogram reassuring. Cardiac markers are reassuring. Neurology has been following.  It is quite concerning however that she fell face first without any recollection. Cardiac syncope i.e. from arrhythmias such as ventricular tachycardia or significant pauses can manifest themselves in this way. Despite her reassuring telemetry currently, I would like for her to wear an event monitor for one month. I have discussed with her not to drive for 6 months. As stated above, cardiac syncope can result in falls however they do not cause mental status changes as were witnessed during this hospitalization and she does have a history of "seizure disorder "as she was on gabapentin for this she states. A neurologic cause is the most likely. During the episode of mental status change in the hospital which is well documented by Dr. Quay Burow, she did not have any evidence of arrhythmia on telemetry.  Chest pain-cardiac markers, echocardiogram reassuring. Atypical chest pain especially in the setting of recent fall. No further cardiac testing warranted at this time. Prior cardiac catheterization reassuring.  Obesity-weight loss counseling  HTN - per primary team  I will have my office set up a monitor for her. No further recommendations at this time. Please call if any further assistance is needed.  Candee Furbish, MD  04/26/2011  3:37 PM

## 2011-04-27 LAB — GLUCOSE, CAPILLARY
Glucose-Capillary: 103 mg/dL — ABNORMAL HIGH (ref 70–99)
Glucose-Capillary: 98 mg/dL (ref 70–99)
Glucose-Capillary: 130 mg/dL — ABNORMAL HIGH (ref 70–99)

## 2011-04-27 MED ORDER — GABAPENTIN 100 MG PO CAPS: 200.0000 mg | ORAL_CAPSULE | Freq: Three times a day (TID) | ORAL | Status: DC

## 2011-04-27 NOTE — Progress Notes (Signed)
S: [name] feeling well this morning, she was sleeping with BiPAP mask. Easily arousable. No complaints of chest pain. She states that she feels "tired. This may be from hospitalization. No further syncopal episodes.  O: telemetry reviewed and demonstrates no adverse arrhythmias, no pauses. Normal sinus rhythm.   97.7, 17, 75, 134/87 General: Alert and oriented x3 Cardiorespiratory regular rate and rhythm Lungs: Clear to auscultation bilaterally Abdomen: Obese positive bowel sounds Extremities: No edema.  A/P:  Unexplained syncope-I have called my office and we will supply her with a one-month event monitor to detect any adverse arrhythmias. So far, no cardiac cause to her syncope. Ejection fraction normal. No adverse arrhythmias on telemetry thus far. Neurologic cause more likely given her strange history of change in speech and prior "seizure disorder ".

## 2011-04-27 NOTE — Discharge Summary (Signed)
Physician Discharge Summary  Tiffany [name] MRN: [id number] DOB/AGE: [birth year] 51 y.o.  PCP: William Hamburger, MD, MD   Admit date: 04/24/2011 Discharge date: 04/27/2011  Discharge Diagnoses:  Recurrent syncope: Sleep apnea    OSA on CPAP  Asthma  HTN (hypertension)  DM (diabetes mellitus)  Dyslipidemia  Anemia  Depression with anxiety  Leukocytosis  Diabetic neuropathy   Medication List  As of 04/27/2011 10:15 AM   STOP taking these medications         cyclobenzaprine 10 MG tablet      venlafaxine 75 MG tablet         TAKE these medications         accu-chek multiclix lancets   1 each by Other route as needed. Use as instructed      acyclovir ointment 5 %   Commonly known as: ZOVIRAX   Apply 1 application topically 3 (three) times daily.      albuterol (2.5 MG/3ML) 0.083% nebulizer solution   Commonly known as: PROVENTIL   Take 2.5 mg by nebulization every 6 (six) hours as needed. As needed for shortness of breath.      albuterol 108 (90 BASE) MCG/ACT inhaler   Commonly known as: PROVENTIL HFA;VENTOLIN HFA   Inhale 2 puffs into the lungs every 6 (six) hours as needed.      atorvastatin 40 MG tablet   Commonly known as: LIPITOR   Take 40 mg by mouth daily.      budesonide-formoterol 160-4.5 MCG/ACT inhaler   Commonly known as: SYMBICORT   Inhale 2 puffs into the lungs 2 (two) times daily.      buPROPion 150 MG 12 hr tablet   Commonly known as: WELLBUTRIN SR   Take 150 mg by mouth 2 (two) times daily.      clonazePAM 1 MG tablet   Commonly known as: KLONOPIN   Take 1 mg by mouth 3 (three) times daily as needed. anxiety      cloNIDine 0.2 MG tablet   Commonly known as: CATAPRES   Take 0.2 mg by mouth 3 (three) times daily.      DULoxetine 60 MG capsule   Commonly known as: CYMBALTA   Take 60 mg by mouth 2 (two) times daily.      fluticasone 50 MCG/ACT nasal spray   Commonly known as: FLONASE   Place 2 sprays into the nose daily.     furosemide 40 MG tablet   Commonly known as: LASIX   Take 40 mg by mouth daily.      gabapentin 100 MG capsule   Commonly known as: NEURONTIN   Take 2 capsules (200 mg total) by mouth 3 (three) times daily.      glucose blood test strip   1 each by Other route as needed. Use as instructed      lisinopril 20 MG tablet   Commonly known as: PRINIVIL,ZESTRIL   Take 20 mg by mouth 2 (two) times daily.      loratadine 10 MG tablet   Commonly known as: CLARITIN   Take 10 mg by mouth daily.      meloxicam 7.5 MG tablet   Commonly known as: MOBIC   Take 1 tablet (7.5 mg total) by mouth daily.      metFORMIN 1000 MG (MOD) 24 hr tablet   Commonly known as: GLUMETZA   Take 1,000 mg by mouth daily with breakfast.      montelukast 10 MG tablet   Commonly  known as: SINGULAIR   Take 10 mg by mouth at bedtime.      omeprazole 40 MG capsule   Commonly known as: PRILOSEC   Take 40 mg by mouth daily.      oxybutynin 5 MG tablet   Commonly known as: DITROPAN   Take 5 mg by mouth 3 (three) times daily.      ranitidine 300 MG tablet   Commonly known as: ZANTAC   Take 300 mg by mouth 2 (two) times daily.      traMADol 50 MG tablet   Commonly known as: ULTRAM   Take 50 mg by mouth 3 (three) times daily.      valACYclovir 500 MG tablet   Commonly known as: VALTREX   Take 500 mg by mouth 2 (two) times daily.      VYVANSE 70 MG capsule   Generic drug: lisdexamfetamine   Take 70 mg by mouth every morning.            Discharge Condition: Stable Disposition: 01-Home or Self Care   Consults: Candee Furbish, MD, cardiology   Significant Diagnostic Studies: Ct Head Wo Contrast  04/24/2011  *RADIOLOGY REPORT*  Clinical Data:  [place] 3 days ago, headache and facial pain.  CT HEAD WITHOUT CONTRAST CT MAXILLOFACIAL WITHOUT CONTRAST  Technique:  Multidetector CT imaging of the head and maxillofacial structures were performed using the standard protocol without intravenous contrast.  Multiplanar CT image reconstructions of the maxillofacial structures were also generated.  Comparison:  None.  CT HEAD  Findings: There is no evidence for acute infarction, intracranial hemorrhage, mass lesion, hydrocephalus, or extra-axial fluid. There is no atrophy or white matter disease.  Calvarium is intact. Clear paranasal sinuses.  Minimal dependent fluid left mastoid not clearly acute.  IMPRESSION: Negative exam.  CT MAXILLOFACIAL  Findings:   There is no visible facial fracture or acute sinus opacity.  Significant bruising is seen in the malar region on the left.  There are multiple teeth missing which is a chronic finding. The mandibular condyles are slightly anteriorly displaced in an open mouth type configuration of the TMJ, but I do not definitely see a mandibular fracture or TMJ dislocation. Slight double density of right TMJ appears to not represent a fracture but patient movement.  IMPRESSION: No visible facial fracture or acute sinus opacity.  Left malar hematoma.  Slight open mouth position of the mandible without definite TMJ dislocation.  Findings discussed with ED provider.  Original Report Authenticated By: Staci Righter, M.D.   Mr Brain Wo Contrast  04/26/2011  *RADIOLOGY REPORT*  Clinical Data: Fall.  Headache and facial pain.  Rule out stroke  MRI HEAD WITHOUT CONTRAST  Technique:  Multiplanar, multiecho pulse sequences of the brain and surrounding structures were obtained according to standard protocol without intravenous contrast.  Comparison: CT 04/24/2011  Findings: Image quality degraded by motion and large patient size.  Negative for acute infarct.  Several small white matter hyperintensities bilaterally which may represent chronic ischemia. Negative for hemorrhage or mass lesion.  No edema is present. Negative for subdural fluid collection.  Paranasal sinuses are clear.  IMPRESSION: No acute abnormality.  Mild chronic white matter disease.  Original Report Authenticated By: Truett Perna, M.D.   Dg Chest Portable 1 View  04/24/2011  *RADIOLOGY REPORT*  Clinical Data: Chest and left arm pain.  Syncopal episodes.  PORTABLE CHEST - 1 VIEW  Comparison: 01/15/2011 and 10/03/2010.  Findings: 1319 hours.  Examination was repeated.  Heart size and mediastinal contours are stable.  The lung bases are incompletely penetrated on the initial view.  On the second examination, the lung bases appear clear and no significant pleural effusion is identified.  There is no pneumothorax.  IMPRESSION: No active cardiopulmonary process.  Original Report Authenticated By: Vivia Ewing, M.D.   Ct Maxillofacial Wo Cm  04/24/2011  *RADIOLOGY REPORT*  Clinical Data:  [place] 3 days ago, headache and facial pain.  CT HEAD WITHOUT CONTRAST CT MAXILLOFACIAL WITHOUT CONTRAST  Technique:  Multidetector CT imaging of the head and maxillofacial structures were performed using the standard protocol without intravenous contrast. Multiplanar CT image reconstructions of the maxillofacial structures were also generated.  Comparison:  None.  CT HEAD  Findings: There is no evidence for acute infarction, intracranial hemorrhage, mass lesion, hydrocephalus, or extra-axial fluid. There is no atrophy or white matter disease.  Calvarium is intact. Clear paranasal sinuses.  Minimal dependent fluid left mastoid not clearly acute.  IMPRESSION: Negative exam.  CT MAXILLOFACIAL  Findings:   There is no visible facial fracture or acute sinus opacity.  Significant bruising is seen in the malar region on the left.  There are multiple teeth missing which is a chronic finding. The mandibular condyles are slightly anteriorly displaced in an open mouth type configuration of the TMJ, but I do not definitely see a mandibular fracture or TMJ dislocation. Slight double density of right TMJ appears to not represent a fracture but patient movement.  IMPRESSION: No visible facial fracture or acute sinus opacity.  Left malar hematoma.  Slight open  mouth position of the mandible without definite TMJ dislocation.  Findings discussed with ED provider.  Original Report Authenticated By: Staci Righter, M.D.   2-D echo Left ventricle: The cavity size was normal. Wall thickness was increased in a pattern of mild LVH. There was mild concentric hypertrophy. Systolic function was normal. The estimated ejection fraction was in the range of 55% to 60%. Wall motion was normal; there were no regional wall motion abnormalities. Doppler parameters are consistent with abnormal left ventricular relaxation (grade 1 diastolic dysfunction). - Mitral valve: Mildly thickened leaflets . Mild regurgitation. - Atrial septum: No defect or patent foramen ovale was identified.    Microbiology: Recent Results (from the past 240 hour(s))  URINE CULTURE     Status: Normal   Collection Time   04/24/11  4:31 PM      Component Value Range Status Comment   Specimen Description URINE, CLEAN CATCH   Final    Special Requests NONE   Final    Culture  Setup Time [id number]   Final    Colony Count >=100,000 COLONIES/ML   Final    Culture     Final    Value: Multiple bacterial morphotypes present, none predominant. Suggest appropriate recollection if clinically indicated.   Report Status 04/25/2011 FINAL   Final      Labs: Results for orders placed during the hospital encounter of 04/24/11 (from the past 48 hour(s))  GLUCOSE, CAPILLARY     Status: Normal   Collection Time   04/25/11 12:01 PM      Component Value Range Comment   Glucose-Capillary 91  70 - 99 (mg/dL)   GLUCOSE, CAPILLARY     Status: Abnormal   Collection Time   04/25/11  4:36 PM      Component Value Range Comment   Glucose-Capillary 124 (*) 70 - 99 (mg/dL)   GLUCOSE, CAPILLARY     Status:  Abnormal   Collection Time   04/25/11  9:41 PM      Component Value Range Comment   Glucose-Capillary 111 (*) 70 - 99 (mg/dL)    Comment 1 Documented in Chart      Comment 2 Notify RN     GLUCOSE,  CAPILLARY     Status: Abnormal   Collection Time   04/26/11  7:34 AM      Component Value Range Comment   Glucose-Capillary 121 (*) 70 - 99 (mg/dL)   GLUCOSE, CAPILLARY     Status: Abnormal   Collection Time   04/26/11 11:42 AM      Component Value Range Comment   Glucose-Capillary 122 (*) 70 - 99 (mg/dL)   GLUCOSE, CAPILLARY     Status: Abnormal   Collection Time   04/26/11  4:54 PM      Component Value Range Comment   Glucose-Capillary 102 (*) 70 - 99 (mg/dL)   GLUCOSE, CAPILLARY     Status: Abnormal   Collection Time   04/26/11  9:42 PM      Component Value Range Comment   Glucose-Capillary 114 (*) 70 - 99 (mg/dL)    Comment 1 Notify RN     GLUCOSE, CAPILLARY     Status: Abnormal   Collection Time   04/27/11  7:53 AM      Component Value Range Comment   Glucose-Capillary 103 (*) 70 - 99 (mg/dL)    Comment 1 Notify RN        HPI 51 year old female with diabetes, hyperlipidemia, prior stroke approximately in the year 2000, prior mild heart attack also in the year 2000 in [place] with cardiac catheterization that she states was reassuring here in the hospital for evaluation of recurrent syncope. She has extensive ecchymosis on the left side of her face from where she fell. She does not remember the act of falling but she does remember waking up immediately after. She does not recall being diaphoretic, nauseous. She has had a few episodes similar to this which her family has witnessed. She states that she will begin to slur her words, garble her speech, become confused and her family sometimes needs to help her down to the floor.   Hospital course  #1 recurrent syncope, thought to be secondary to multiple causes including polypharmacy possible arrhythmia, possible sleep apnea. Per neurology the patients symptoms are due to sleep apnea, with a low suspicion for seizures. A prolonged EEG and simultaneous video recording has been recommended. This will be done at [facility] neurology.  Cardiology consultation was also obtained, with the following workup recommended. MRI of the brain was negative.  #2 coronary artery diseaseCurrently does not describe any chest discomfort, although on admission her chest wall was uncomfortable after sustaining a fall. Originally per history of present illness it was 7/10 in intensity with no other associated factors. It may have radiated to her left leg and occasionally to her left arm. I questioned her about her chest discomfort she states that she no longer has this. She does state however that she becomes short of breath/anxious/panicy quite easily, for instance while working with physical therapy today she felt winded as though her asthma were acting up.  She states that a few years ago she did undergo a cardiac catheterization but this was a reassuring study. No prior stents. She also states that she was "allergic to anesthesia "can you to have several cardiac studies done to make sure that the machines her alive.  An echocardiogram was performed on this admission and reassuring with normal ejection fraction and no wall motion abnormalities, personally viewed. Her cardiac markers were also reassuring with negative troponin. Her BNP was slightly elevated at 159 however she is morbidly obese and this may be indicative of mild diastolic dysfunction.    at this time a one-month event monitor to detect any adverse arrhythmias, is being recommended.  #3 polypharmacy I have discontinued Effexor and Flexeril  #4 followup Event monitor and EEG with video monitoring, as well as sleep study is recommended  HOSPITAL COURSE:  General: Overweight, lying in bed, in no acute distress  Head: Eyes PERRLA, No xanthomas. Large left-sided facial ecchymosis noted  Lungs: Clear bilaterally to auscultation. Normal respiratory effort. No wheezes, no rales.  Heart: HRRR S1 S2 Pulses are 2+ & equal. No appreciable murmurs  No carotid bruit. No JVD. No abdominal bruits.  No femoral bruits.  Abdomen: Bowel sounds are positive, abdomen soft and non-tender without masses. Obese  Msk: Back normal, gait not assessed. Normal strength and tone for age.  Extremities: No clubbing, cyanosis or edema. DP +1  Neuro: Alert and oriented X 3, non-focal, MAE x 4  GU: Deferred  Rectal: Deferred  Psych: Good affect, responds appropriately, not appear anxious currently      Discharge Exam:  Blood pressure 132/92, pulse 75, temperature 97.7 F (36.5 C), temperature source Oral, resp. rate 17, height 5\' 4"  (1.626 m), weight 121 kg (266 lb 12.1 oz), SpO2 95.00%.     Discharge Orders    Future Orders Please Complete By Expires   Diet - low sodium heart healthy      Increase activity slowly      Call MD for:  temperature >100.4      Call MD for:  persistant nausea and vomiting      Call MD for:  severe uncontrolled pain         Follow-up Information    Follow up with Primary care provider in 1 week. (Posthospital followup)       Follow up with Candee Furbish, MD in 2 weeks. (Recurrent syncope)    Contact information:   [address] [phone]          Signed: [name] Dumas 04/27/2011, 10:15 AM

## 2011-04-27 NOTE — ED Provider Notes (Signed)
Medical screening examination/treatment/procedure(s) were conducted as a shared visit with non-physician practitioner(s) and myself.  I personally evaluated the patient during the encounter S/p fall, ?seizure. Currently asymptomatic. No headache. No numbness/weakness. Denies palpitations or chest pain. Alert, oriented. No c/o.  Mirna Mires, MD 04/27/11 [phone]

## 2011-05-02 NOTE — Procedures (Signed)
EEG NUMBER:  [id number]  This routine EEG was requested in this 51 year old woman with history of altered mental status.  MEDICATIONS:  Klonopin, Wellbutrin, Catapres, Neurontin, and Effexor.  The EEG was done with the patient awake and drowsy.  During periods of maximal wakefulness, she had a 9-10 cycle per second posterior dominant rhythm that was of medium amplitude and symmetric.  It was moderately regulated and moderately sustained.  Background activities were composed of low-amplitude beta activities that were frontally dominant and symmetric.  Photic stimulation did not produce a driving response. Hyperventilation was not performed.  The patient did become drowsy as characterized by some attenuation of both the amplitude and persistence of the alpha rhythm and the appearance of bursts of slower delta and theta activities.  CLINICAL INTERPRETATION:  This routine EEG done with the patient awake and drowsy is normal.          ______________________________ Neena Rhymes, MD    [id number] D:  04/25/2011 23:29:13  T:  04/25/2011 23:51:42  Job #:  [id number]

## 2011-07-05 ENCOUNTER — Telehealth: Payer: Self-pay | Admitting: Emergency Medicine

## 2011-07-05 NOTE — Telephone Encounter (Signed)
Pt returned call.  [name] c/o her eyes watering and itching with some bodily itching as well.  Pt denied any changes in her breathing - denied increased SOB, wheezing, coughing, tightness, sinus pressure/drainage.  Advised pt to contact her PCP regarding these symptoms since she stated that she was not having any breathing issues.  Pt then stated that she thought Dr Lamonte Sakai was an allergist.  I informed pt that he is not but to please call should anything further be needed.  Pt was very gracious and verbalized her understanding.  Will sign off.

## 2011-07-05 NOTE — Telephone Encounter (Signed)
LMTCB

## 2011-08-30 ENCOUNTER — Encounter: Payer: Self-pay | Admitting: Family Medicine

## 2011-08-30 ENCOUNTER — Ambulatory Visit (INDEPENDENT_AMBULATORY_CARE_PROVIDER_SITE_OTHER): Payer: Medicaid Other | Admitting: Family Medicine

## 2011-08-30 VITALS — BP 112/75 | HR 81 | Ht 63.78 in | Wt 277.0 lb

## 2011-08-30 DIAGNOSIS — L299 Pruritus, unspecified: Secondary | ICD-10-CM

## 2011-08-30 DIAGNOSIS — B009 Herpesviral infection, unspecified: Secondary | ICD-10-CM | POA: Insufficient documentation

## 2011-08-30 MED ORDER — VALACYCLOVIR HCL 500 MG PO TABS: 500.0000 mg | ORAL_TABLET | Freq: Two times a day (BID) | ORAL | Status: DC

## 2011-08-30 MED ORDER — LISINOPRIL 20 MG PO TABS: 20.0000 mg | ORAL_TABLET | Freq: Two times a day (BID) | ORAL | Status: DC

## 2011-08-30 MED ORDER — TRIAMCINOLONE ACETONIDE 0.5 % EX OINT: TOPICAL_OINTMENT | Freq: Two times a day (BID) | CUTANEOUS | Status: DC

## 2011-08-30 MED ORDER — BUPROPION HCL ER (SR) 150 MG PO TB12: 150.0000 mg | ORAL_TABLET | Freq: Two times a day (BID) | ORAL | Status: DC

## 2011-08-30 NOTE — Patient Instructions (Addendum)
Thank you for coming in today, it was good to see you I have sent in more medication for herpes supression.  Try the cream for itching.

## 2011-09-06 DIAGNOSIS — L299 Pruritus, unspecified: Secondary | ICD-10-CM | POA: Insufficient documentation

## 2011-09-06 NOTE — Assessment & Plan Note (Signed)
Likely reaction to environmental/food allergen vs. Mild eczema.  Will give trial of TCO cream.  Instructed to return if no improvement.

## 2011-09-06 NOTE — Assessment & Plan Note (Signed)
Refilled valtrex  

## 2011-09-06 NOTE — Progress Notes (Signed)
  Subjective:    Patient ID: Tiffany [name], female    DOB: 11-11-1960, 51 y.o.   MRN: [id number]  HPI 1. Itching:  Patient with complaint of "itching all over" for a few weeks.  States she stays in the shower all the time due to itching.  Has had skin itching before but never this bad.  Has not tried anything for relief.  Has not seen a rash.  Denies change in soaps, detergents, lotions.  Does not recall any new foods.     2. HSV:  History of genital herpes.  Currently on valtrex for supression, which works well.  Would like refill on valtrex.  Had several outbreaks per year prior to starting medication.  Denies fever, current outbreak.   Review of Systems Per HPI    Objective:   Physical Exam  Constitutional:       Obese female, nad   Eyes: No scleral icterus.  Cardiovascular: Normal rate and regular rhythm.   Pulmonary/Chest: Effort normal and breath sounds normal.  Skin:       Xerotic appearing with some scaling on forearms.  No erythema or tenderness.            Assessment & Plan:

## 2011-09-12 ENCOUNTER — Other Ambulatory Visit: Payer: Self-pay | Admitting: Family Medicine

## 2011-09-12 ENCOUNTER — Other Ambulatory Visit: Payer: Self-pay | Admitting: *Deleted

## 2011-09-12 MED ORDER — GABAPENTIN 100 MG PO CAPS: 200.0000 mg | ORAL_CAPSULE | Freq: Three times a day (TID) | ORAL | Status: DC

## 2011-09-12 MED ORDER — OMEPRAZOLE 40 MG PO CPDR: 40.0000 mg | DELAYED_RELEASE_CAPSULE | Freq: Every day | ORAL | Status: DC

## 2011-09-12 MED ORDER — TRAMADOL HCL 50 MG PO TABS: 50.0000 mg | ORAL_TABLET | Freq: Three times a day (TID) | ORAL | Status: DC

## 2011-09-14 ENCOUNTER — Telehealth: Payer: Self-pay | Admitting: Family Medicine

## 2011-09-14 NOTE — Telephone Encounter (Signed)
Called pt. Number for [facility] given. Pt can make appt by herself, has medicaid. Pt also asked for refill of clonezepam. She reports, that Dr.Matthews told her, that he would refill it until she has an appt with pyschiatrist. [facility] [address]. Javier Glazier, Gerrit Heck

## 2011-09-14 NOTE — Telephone Encounter (Signed)
Is asking about her referral to a psychologist

## 2011-09-15 MED ORDER — CLONAZEPAM 1 MG PO TABS: 1.0000 mg | ORAL_TABLET | Freq: Three times a day (TID) | ORAL | Status: DC | PRN

## 2011-09-15 NOTE — Telephone Encounter (Signed)
Patient is calling to speak to the nurse about her medications.

## 2011-09-15 NOTE — Telephone Encounter (Signed)
Will text page  Dr. Zigmund Daniel about this refill.

## 2011-09-15 NOTE — Telephone Encounter (Signed)
Patient notified that RX has been sent in.

## 2011-09-15 NOTE — Telephone Encounter (Signed)
I will refill x1 month.  Patient needs  to allow 24-48 hours for refill requests and call before she runs out.  Called into [facility]

## 2011-09-15 NOTE — Telephone Encounter (Signed)
Tiffany [name] calling back stating she is needing to have her clonazepam refilled.  Want to know why provider haven't sent in refill order yet.

## 2011-09-20 ENCOUNTER — Ambulatory Visit: Payer: Medicaid Other | Attending: Anesthesiology | Admitting: Physical Therapy

## 2011-09-25 ENCOUNTER — Ambulatory Visit: Payer: Medicaid Other | Admitting: Rehabilitative and Restorative Service Providers"

## 2011-09-29 ENCOUNTER — Other Ambulatory Visit: Payer: Self-pay | Admitting: *Deleted

## 2011-09-29 NOTE — Telephone Encounter (Signed)
Refill request.  Also requesting refill on Flexeril but I see that she only received this as an IP.

## 2011-10-02 ENCOUNTER — Other Ambulatory Visit: Payer: Self-pay | Admitting: *Deleted

## 2011-10-02 MED ORDER — OXYBUTYNIN CHLORIDE 5 MG PO TABS: 5.0000 mg | ORAL_TABLET | Freq: Three times a day (TID) | ORAL | Status: DC

## 2011-10-02 NOTE — Telephone Encounter (Signed)
Patient calling multiple time today asking about her refills. Says that she's a "nervous wreck".   As I was following up on this issue 5 refill requests were faxed to the clinic.  Will route refill request to Dr. Zigmund Daniel.

## 2011-10-03 ENCOUNTER — Other Ambulatory Visit: Payer: Self-pay | Admitting: Family Medicine

## 2011-10-03 MED ORDER — TRAMADOL HCL 50 MG PO TABS: 50.0000 mg | ORAL_TABLET | Freq: Three times a day (TID) | ORAL | Status: DC

## 2011-10-03 MED ORDER — CYCLOBENZAPRINE HCL 10 MG PO TABS: 10.0000 mg | ORAL_TABLET | Freq: Three times a day (TID) | ORAL | Status: DC | PRN

## 2011-10-03 MED ORDER — GABAPENTIN 100 MG PO CAPS: 200.0000 mg | ORAL_CAPSULE | Freq: Three times a day (TID) | ORAL | Status: DC

## 2011-10-03 MED ORDER — BUPROPION HCL ER (SR) 150 MG PO TB12: 150.0000 mg | ORAL_TABLET | Freq: Two times a day (BID) | ORAL | Status: DC

## 2011-10-03 MED ORDER — ALBUTEROL SULFATE HFA 108 (90 BASE) MCG/ACT IN AERS: 2.0000 | INHALATION_SPRAY | Freq: Four times a day (QID) | RESPIRATORY_TRACT | Status: DC | PRN

## 2011-10-03 MED ORDER — OMEPRAZOLE 40 MG PO CPDR: 40.0000 mg | DELAYED_RELEASE_CAPSULE | Freq: Every day | ORAL | Status: DC

## 2011-10-06 ENCOUNTER — Encounter: Payer: Medicaid Other | Admitting: Family Medicine

## 2011-10-11 ENCOUNTER — Telehealth: Payer: Self-pay | Admitting: Family Medicine

## 2011-10-11 NOTE — Telephone Encounter (Signed)
Pt was given a script for buPROPion (WELLBUTRIN SR) 150 MG and also gabapentin (NEURONTIN) 100 MG capsule  Was given the wrong amount and she will be out early.  She needs this taken care of because her medicaid is about to run out.   [facility] [address]

## 2011-10-11 NOTE — Telephone Encounter (Signed)
Patient takes Wellbutrin--1 tab BID and Neurontin--2 tabs TID.  Will route request to Dr. Zigmund Daniel.  Nolene Ebbs, RN

## 2011-10-15 MED ORDER — GABAPENTIN 100 MG PO CAPS: 200.0000 mg | ORAL_CAPSULE | Freq: Three times a day (TID) | ORAL | Status: DC

## 2011-10-15 MED ORDER — BUPROPION HCL ER (SR) 150 MG PO TB12: 150.0000 mg | ORAL_TABLET | Freq: Two times a day (BID) | ORAL | Status: DC

## 2011-10-15 NOTE — Telephone Encounter (Signed)
Refilled, but explained to her in the past that she needs to find psychiatrist in town to take over prescribing her multiple psychiatric medications.

## 2011-10-16 NOTE — Telephone Encounter (Signed)
Returned call to patient.  Unable to leave message to call our office back due to "voicemail has not been set up yet."  Marvel Plan, Arlyss Gandy, RN

## 2011-10-18 NOTE — Telephone Encounter (Signed)
Returned call to patient.  Unable to leave message to call our office back due to "voicemail has not been set up yet."   Marvel Plan, Arlyss Gandy, RN

## 2011-11-24 ENCOUNTER — Telehealth: Payer: Self-pay | Admitting: Family Medicine

## 2011-11-24 MED ORDER — CLONAZEPAM 1 MG PO TABS: 1.0000 mg | ORAL_TABLET | Freq: Three times a day (TID) | ORAL | Status: DC | PRN

## 2011-11-24 NOTE — Telephone Encounter (Addendum)
Spoke with caregiver and she states patient needs clonidine , gabapentin , klonopin and naprosyn. I called [facility] [address] and was told she has a current RX on file for clonidine and gabapentin and she will transfer to [facility] now..  According to [facility] pharmacist patient has not had refill on klonipin since 09/15/2011. Caregiver states patient really needs now as she is getting very upset  And has been out of it for a while. . I can hear patient in background demanding her refills and stating she doesn't need to come to doctor just to get meds refilled.   Will talk with Dr. Loraine Maple about refilling  Klonopin. Do not even see naprosyn on med list.

## 2011-11-24 NOTE — Telephone Encounter (Signed)
Spoke with patient and appointment is scheduled for 11/13. RX phoned in to [facility].

## 2011-11-24 NOTE — Telephone Encounter (Addendum)
Pt is out of clonazapam & klonopin and doesn't have any refills  [name]

## 2011-11-24 NOTE — Telephone Encounter (Signed)
Patient started out conversation talking about gabapentin.  States she has been taking 200 mg three times daily since August. But states this is not right , she should be taking 300 mg three times a day.  States she is out of the medication and I explained that med was sent into pharmacy 09/29 with refills . She wants it now sent to [facility]. I advised her to call [facility] and have them transfer to [place]. Then she states she needs klonopin refilled. Explained that Rx  request will be sent to [facility].    Will forward to Dr. Loraine Maple and Dr. Zigmund Daniel.

## 2011-11-24 NOTE — Telephone Encounter (Signed)
Per Dr. Zigmund Daniel' phone note on 10/11/11: "explained to her in the past that she needs to find psychiatrist in town to take over prescribing her multiple psychiatric medications."  In addition, patient has been told by Dr. Zigmund Daniel (on 09/14/11 re: Klonopin):"Patient needs to allow 24-48 hours for refill requests and call before she runs out."  Neither of these appear to have been done.  Patient's last refill of Klonopin was on 09/15/11 #90 with instructions to take TID PRN.  Will give enough meds to last for a few days (#12) as long as pt schedules appt to see PCP next week.  If pt unwilling to come in to be seen, will not refill the medications.  Discussed with Jeani Hawking who will call pt's caregiver and explain this to her.

## 2011-11-24 NOTE — Telephone Encounter (Signed)
Pt is asking to speak with nurse about her migraine

## 2011-11-29 ENCOUNTER — Ambulatory Visit: Payer: Medicaid Other | Admitting: Family Medicine

## 2011-12-04 ENCOUNTER — Telehealth: Payer: Self-pay | Admitting: *Deleted

## 2011-12-04 MED ORDER — CLONAZEPAM 1 MG PO TABS: 1.0000 mg | ORAL_TABLET | Freq: Three times a day (TID) | ORAL | Status: DC | PRN

## 2011-12-04 NOTE — Telephone Encounter (Signed)
Called patient and she is requesting refill on klonopin, naproxen, and clonidine. I told patient she can only have enough to make it to her appointment on Friday and if she misses that appointment she will not receive any further refills. Also told patient that Dr Zigmund Daniel is not going to continue to prescribe these medications to her that she will need to see psych. Forward to PCP for refill. Patient instructed to come to office to pick up Rx this afternoon.Tiffany [name], Kevin Fenton

## 2011-12-04 NOTE — Telephone Encounter (Signed)
Called pt. No answer, no voice mail. Please tell pt, that Rx  (clonazepam) is ready for pick up. Javier Glazier, Gerrit Heck

## 2011-12-04 NOTE — Addendum Note (Signed)
Addended by: Luetta Nutting on: 12/04/2011 01:33 PM   Modules accepted: Orders

## 2011-12-04 NOTE — Addendum Note (Signed)
Addended by: Luetta Nutting on: 12/04/2011 01:36 PM   Modules accepted: Orders

## 2011-12-04 NOTE — Addendum Note (Signed)
Addended by: Luetta Nutting on: 12/04/2011 01:35 PM   Modules accepted: Orders

## 2011-12-08 ENCOUNTER — Encounter: Payer: Self-pay | Admitting: Family Medicine

## 2011-12-08 ENCOUNTER — Ambulatory Visit (INDEPENDENT_AMBULATORY_CARE_PROVIDER_SITE_OTHER): Payer: Self-pay | Admitting: Family Medicine

## 2011-12-08 VITALS — BP 120/81 | HR 79 | Temp 98.3°F | Ht 63.78 in | Wt 275.0 lb

## 2011-12-08 DIAGNOSIS — F341 Dysthymic disorder: Secondary | ICD-10-CM

## 2011-12-08 DIAGNOSIS — F418 Other specified anxiety disorders: Secondary | ICD-10-CM

## 2011-12-08 DIAGNOSIS — F319 Bipolar disorder, unspecified: Secondary | ICD-10-CM

## 2011-12-08 MED ORDER — GABAPENTIN 300 MG PO CAPS: 300.0000 mg | ORAL_CAPSULE | Freq: Two times a day (BID) | ORAL | Status: DC

## 2011-12-08 MED ORDER — ZIPRASIDONE HCL 80 MG PO CAPS: 80.0000 mg | ORAL_CAPSULE | Freq: Two times a day (BID) | ORAL | Status: DC

## 2011-12-08 MED ORDER — CLONAZEPAM 1 MG PO TABS: 1.0000 mg | ORAL_TABLET | Freq: Three times a day (TID) | ORAL | Status: DC | PRN

## 2011-12-08 NOTE — Patient Instructions (Addendum)
Thank you for coming in today, it was good to see you As discussed multiple times before you need to be seen by psychiatry, continue to call [facility] I will give you two weeks of your medications as I am not comfortable managing many of the medications you are on.

## 2011-12-08 NOTE — Assessment & Plan Note (Signed)
Patient is quite hostile today towards me due to me not giving her full refills on her medications.  I have discussed with her in the past that she needs to be seen by psychiatry and have printed out resources for psychiatrists in town twice for her.  She still has not made an appointment to be seen.  I explained to her that I would refill her clonazepam for two weeks until she has an appointment with psychiatry or I would wean her off.  This seemed to make her even angrier and she states that she would prefer not to see me anymore because I am not looking out for her.  I did give her 15 days of geodon as well until and instructed to make appointment with psychiatry.  I gave her the number for [facility] in [place] and instructed very clearly to make appointment for the near future.  I was clear that I would not continue to fill clonazepam and geodon as I did not fill comfortable continuing to prescribe these medications and monitor her on these medications and she would benefit most to have a psychiatrist manage these medications.  I also made it clear that I would be glad to continue following her for her other medical problems.

## 2011-12-08 NOTE — Progress Notes (Signed)
  Subjective:    Patient ID: Tiffany [name], female    DOB: 1960/03/22, 51 y.o.   MRN: [id number]  HPI Patient here to discuss refills of medications.  [name] quite hostile towards me today and States "you need to fill my clonazepam and geodon now!". She still has not been seen by psychiatry since she has moved to the [place] area.  She states she has tried to call [facility] today and talked to someone but does not remember what they said, then a few minutes later she says that she was unable to get through.  She does endorse increased anxiety but denies SI or HI.   Review of Systems Per HPI    Objective:   Physical Exam  Constitutional:       Obese female, nad   HENT:  Head: Normocephalic and atraumatic.  Psychiatric: Her affect is angry (yelling at me today.). [name] agitated and aggressive.          Assessment & Plan:

## 2012-01-03 ENCOUNTER — Other Ambulatory Visit: Payer: Self-pay | Admitting: *Deleted

## 2012-01-04 ENCOUNTER — Other Ambulatory Visit: Payer: Self-pay | Admitting: Family Medicine

## 2012-01-04 MED ORDER — FUROSEMIDE 40 MG PO TABS: 40.0000 mg | ORAL_TABLET | Freq: Every day | ORAL | Status: DC

## 2012-01-04 MED ORDER — VERAPAMIL HCL ER 360 MG PO CP24: 360.0000 mg | ORAL_CAPSULE | Freq: Every day | ORAL | Status: DC

## 2012-01-04 MED ORDER — TRAMADOL HCL 50 MG PO TABS: 50.0000 mg | ORAL_TABLET | Freq: Three times a day (TID) | ORAL | Status: DC

## 2012-01-11 ENCOUNTER — Telehealth: Payer: Self-pay | Admitting: *Deleted

## 2012-01-11 NOTE — Telephone Encounter (Signed)
Have sent in tramadol electronically before without any problem, should not be a controlled substance.

## 2012-01-11 NOTE — Telephone Encounter (Signed)
Pharmacy received e-Rx for Tramadol.  Since it is a controlled med, pharmacy will need Rx with MD's signature.  Will route request to Dr. Zigmund Daniel.  Nolene Ebbs, RN

## 2012-01-12 ENCOUNTER — Other Ambulatory Visit: Payer: Self-pay | Admitting: *Deleted

## 2012-01-12 NOTE — Telephone Encounter (Signed)
Since pharmacy is located in [place], Tramadol is considered a controlled substance and requires signed Rx.  Will need to mail Rx to:  [facility],  [id number], [place]  [address].  They will not accept a faxed Rx.  Will route request to Dr. Zigmund Daniel.  Nolene Ebbs, RN

## 2012-01-13 MED ORDER — LORATADINE 10 MG PO TABS: 10.0000 mg | ORAL_TABLET | Freq: Every day | ORAL | Status: DC

## 2012-01-15 MED ORDER — TRAMADOL HCL 50 MG PO TABS: 50.0000 mg | ORAL_TABLET | Freq: Three times a day (TID) | ORAL | Status: DC | PRN

## 2012-01-15 NOTE — Addendum Note (Signed)
Addended by: Luetta Nutting on: 01/15/2012 01:41 PM   Modules accepted: Orders

## 2012-01-17 NOTE — Telephone Encounter (Signed)
Printed and given to Herbie Baltimore to mail to patients pharmacy.

## 2012-03-20 ENCOUNTER — Other Ambulatory Visit: Payer: Self-pay | Admitting: *Deleted

## 2012-03-20 MED ORDER — ALBUTEROL SULFATE (2.5 MG/3ML) 0.083% IN NEBU: 2.5000 mg | INHALATION_SOLUTION | Freq: Four times a day (QID) | RESPIRATORY_TRACT | Status: DC | PRN

## 2012-03-20 MED ORDER — FLUTICASONE PROPIONATE 50 MCG/ACT NA SUSP: 2.0000 | Freq: Every day | NASAL | Status: DC

## 2012-03-27 ENCOUNTER — Emergency Department (HOSPITAL_COMMUNITY): Payer: Self-pay

## 2012-03-27 ENCOUNTER — Observation Stay (HOSPITAL_COMMUNITY)
Admission: EM | Admit: 2012-03-27 | Discharge: 2012-03-27 | Discharge status: Against Medical Advice | Payer: MEDICAID | Attending: Emergency Medicine | Admitting: Emergency Medicine

## 2012-03-27 ENCOUNTER — Ambulatory Visit: Payer: Self-pay | Admitting: Family Medicine

## 2012-03-27 ENCOUNTER — Encounter (HOSPITAL_COMMUNITY): Payer: Self-pay | Admitting: Emergency Medicine

## 2012-03-27 DIAGNOSIS — I959 Hypotension, unspecified: Secondary | ICD-10-CM | POA: Insufficient documentation

## 2012-03-27 DIAGNOSIS — R55 Syncope and collapse: Principal | ICD-10-CM | POA: Insufficient documentation

## 2012-03-27 DIAGNOSIS — E872 Acidosis: Secondary | ICD-10-CM

## 2012-03-27 DIAGNOSIS — G4730 Sleep apnea, unspecified: Secondary | ICD-10-CM | POA: Insufficient documentation

## 2012-03-27 DIAGNOSIS — Z79899 Other long term (current) drug therapy: Secondary | ICD-10-CM | POA: Insufficient documentation

## 2012-03-27 DIAGNOSIS — R799 Abnormal finding of blood chemistry, unspecified: Secondary | ICD-10-CM | POA: Insufficient documentation

## 2012-03-27 DIAGNOSIS — I252 Old myocardial infarction: Secondary | ICD-10-CM | POA: Insufficient documentation

## 2012-03-27 DIAGNOSIS — J4489 Other specified chronic obstructive pulmonary disease: Secondary | ICD-10-CM | POA: Insufficient documentation

## 2012-03-27 DIAGNOSIS — E876 Hypokalemia: Secondary | ICD-10-CM | POA: Insufficient documentation

## 2012-03-27 DIAGNOSIS — R6889 Other general symptoms and signs: Secondary | ICD-10-CM | POA: Insufficient documentation

## 2012-03-27 DIAGNOSIS — J449 Chronic obstructive pulmonary disease, unspecified: Secondary | ICD-10-CM | POA: Insufficient documentation

## 2012-03-27 DIAGNOSIS — L299 Pruritus, unspecified: Secondary | ICD-10-CM | POA: Insufficient documentation

## 2012-03-27 DIAGNOSIS — E119 Type 2 diabetes mellitus without complications: Secondary | ICD-10-CM | POA: Insufficient documentation

## 2012-03-27 DIAGNOSIS — I10 Essential (primary) hypertension: Secondary | ICD-10-CM | POA: Insufficient documentation

## 2012-03-27 LAB — CBC
HCT: 33.9 % — ABNORMAL LOW (ref 36.0–46.0)
Hemoglobin: 10.7 g/dL — ABNORMAL LOW (ref 12.0–15.0)
MCH: 27.4 pg (ref 26.0–34.0)
MCHC: 31.6 g/dL (ref 30.0–36.0)
MCV: 86.9 fL (ref 78.0–100.0)
Platelets: 398 10*3/uL (ref 150–400)
RBC: 3.9 MIL/uL (ref 3.87–5.11)
RDW: 16.1 % — ABNORMAL HIGH (ref 11.5–15.5)
WBC: 10.8 10*3/uL — ABNORMAL HIGH (ref 4.0–10.5)

## 2012-03-27 LAB — CG4 I-STAT (LACTIC ACID): Lactic Acid, Venous: 3.23 mmol/L — ABNORMAL HIGH (ref 0.5–2.2)

## 2012-03-27 LAB — BASIC METABOLIC PANEL
BUN: 14 mg/dL (ref 6–23)
CO2: 22 mEq/L (ref 19–32)
Calcium: 9.1 mg/dL (ref 8.4–10.5)
Chloride: 100 mEq/L (ref 96–112)
Creatinine, Ser: 1.45 mg/dL — ABNORMAL HIGH (ref 0.50–1.10)
GFR calc Af Amer: 47 mL/min — ABNORMAL LOW (ref >90)
GFR calc non Af Amer: 41 mL/min — ABNORMAL LOW (ref >90)
Glucose, Bld: 122 mg/dL — ABNORMAL HIGH (ref 70–99)
Potassium: 3.1 mEq/L — ABNORMAL LOW (ref 3.5–5.1)
Sodium: 136 mEq/L (ref 135–145)

## 2012-03-27 LAB — POCT I-STAT TROPONIN I: Troponin i, poc: 0.01 ng/mL (ref 0.00–0.08)

## 2012-03-27 MED ORDER — SODIUM CHLORIDE 0.9 % IV SOLN
Freq: Once | INTRAVENOUS | Status: AC
  Administered 2012-03-27: 01:00:00 via INTRAVENOUS

## 2012-03-27 MED ORDER — POTASSIUM CHLORIDE CRYS ER 20 MEQ PO TBCR
40.0000 meq | EXTENDED_RELEASE_TABLET | Freq: Once | ORAL | Status: AC
  Administered 2012-03-27: 40 meq via ORAL
  Filled 2012-03-27: qty 2

## 2012-03-27 MED ORDER — DIPHENHYDRAMINE HCL 25 MG PO CAPS
25.0000 mg | ORAL_CAPSULE | Freq: Once | ORAL | Status: AC
  Administered 2012-03-27: 25 mg via ORAL

## 2012-03-27 MED ORDER — SODIUM CHLORIDE 0.9 % IV BOLUS (SEPSIS)
1000.0000 mL | Freq: Once | INTRAVENOUS | Status: AC
  Administered 2012-03-27: 1000 mL via INTRAVENOUS

## 2012-03-27 MED ORDER — SODIUM CHLORIDE 0.9 % IV SOLN: INTRAVENOUS | Status: DC

## 2012-03-27 NOTE — ED Notes (Signed)
Pt expressed desire to leave, explained what the plan of care was and notified MD. Pt still wanted to leave regardless. Pt took out her own IV and walked out of the ED.

## 2012-03-27 NOTE — ED Provider Notes (Signed)
History     CSN: [id number]  Arrival date & time 03/27/12  0057   First MD Initiated Contact with Patient 03/27/12 [phone]      No chief complaint on file.   (Consider location/radiation/quality/duration/timing/severity/associated sxs/prior treatment) HPI Hx per PT, syncope at home tonight not feeling well. PT recently evaluated by PCP for itching and started on hydralazine - she has some throat tightness tonight - feels like cant swallow and she is worried that it may be related to this medication.  Her itching is improved, no SOB or CP. No N/V/D.  PT denies h/o same, has cardiac history. Symptoms moderate in severity. No head or neck injury with syncope tonight PTA \ Past Medical History  Diagnosis Date  . High blood pressure   . Incontinence   . Anxiety   . Manic depression   . Heart attack   . Diabetes mellitus     class 2  . Asthma   . Emphysema   . High cholesterol   . Stroke   . Sinus trouble   . Seasonal allergies   . Sleep apnea   . Disorder of nervous system   . Myocardial infarct   . Complication of anesthesia     decreased bp, decreased heart rate    Past Surgical History  Procedure Laterality Date  . Abdominal hysterectomy    . Cesarean section    . Hiatal hernia repair      Family History  Problem Relation Age of Onset  . Cancer Father     prostate  . Heart failure      cousin  . Cancer Mother     lung    History  Substance Use Topics  . Smoking status: Former Smoker    Types: Cigarettes    Quit date: 04/23/1977  . Smokeless tobacco: Never Used  . Alcohol Use: No    OB History   Grav Para Term Preterm Abortions TAB SAB Ect Mult Living                  Review of Systems  Constitutional: Negative for fever and chills.  HENT: Positive for trouble swallowing. Negative for drooling, mouth sores, neck pain, neck stiffness and voice change.   Eyes: Negative for pain.  Respiratory: Negative for shortness of breath.   Cardiovascular:  Negative for chest pain.  Gastrointestinal: Negative for abdominal pain.  Genitourinary: Negative for dysuria.  Musculoskeletal: Negative for back pain.  Skin: Negative for rash.  Neurological: Positive for syncope. Negative for headaches.  All other systems reviewed and are negative.    Allergies  Codeine; Hydroxyzine; and Sulfa antibiotics  Home Medications   Current Outpatient Rx  Name  Route  Sig  Dispense  Refill  . acyclovir ointment (ZOVIRAX) 5 %   Topical   Apply 1 application topically 3 (three) times daily.         [name] albuterol (PROVENTIL HFA;VENTOLIN HFA) 108 (90 BASE) MCG/ACT inhaler   Inhalation   Inhale 2 puffs into the lungs every 6 (six) hours as needed.   1 Inhaler   1   . albuterol (PROVENTIL) (2.5 MG/3ML) 0.083% nebulizer solution   Nebulization   Take 3 mLs (2.5 mg total) by nebulization every 6 (six) hours as needed. As needed for shortness of breath.   75 mL   1   . atorvastatin (LIPITOR) 40 MG tablet   Oral   Take 40 mg by mouth daily.         [name]  budesonide-formoterol (SYMBICORT) 160-4.5 MCG/ACT inhaler   Inhalation   Inhale 2 puffs into the lungs 2 (two) times daily.           [name] buPROPion (WELLBUTRIN SR) 150 MG 12 hr tablet   Oral   Take 1 tablet (150 mg total) by mouth 2 (two) times daily.   60 tablet   1   . clonazePAM (KLONOPIN) 1 MG tablet   Oral   Take 1 tablet (1 mg total) by mouth 3 (three) times daily as needed. anxiety   60 tablet   0   . cloNIDine (CATAPRES) 0.2 MG tablet   Oral   Take 0.2 mg by mouth 3 (three) times daily.           . cyclobenzaprine (FLEXERIL) 10 MG tablet   Oral   Take 1 tablet (10 mg total) by mouth 3 (three) times daily as needed for muscle spasms.   45 tablet   2   . DULoxetine (CYMBALTA) 60 MG capsule   Oral   Take 60 mg by mouth 2 (two) times daily.           . fluticasone (FLONASE) 50 MCG/ACT nasal spray   Nasal   Place 2 sprays into the nose daily.   16 g   1   . furosemide  (LASIX) 40 MG tablet   Oral   Take 1 tablet (40 mg total) by mouth daily.   30 tablet   2   . gabapentin (NEURONTIN) 300 MG capsule   Oral   Take 1 capsule (300 mg total) by mouth 2 (two) times daily.   60 capsule   3   . lisdexamfetamine (VYVANSE) 70 MG capsule   Oral   Take 70 mg by mouth every morning.           [name] lisinopril (PRINIVIL,ZESTRIL) 20 MG tablet   Oral   Take 1 tablet (20 mg total) by mouth 2 (two) times daily.   30 tablet   1   . loratadine (CLARITIN) 10 MG tablet   Oral   Take 1 tablet (10 mg total) by mouth daily.   30 tablet   6   . meloxicam (MOBIC) 7.5 MG tablet   Oral   Take 1 tablet (7.5 mg total) by mouth daily.   14 tablet   0   . metFORMIN (GLUMETZA) 1000 MG (MOD) 24 hr tablet   Oral   Take 1,000 mg by mouth daily with breakfast.           . montelukast (SINGULAIR) 10 MG tablet   Oral   Take 10 mg by mouth at bedtime.           [name] omeprazole (PRILOSEC) 40 MG capsule   Oral   Take 1 capsule (40 mg total) by mouth daily.   30 capsule   6   . oxybutynin (DITROPAN) 5 MG tablet   Oral   Take 1 tablet (5 mg total) by mouth 3 (three) times daily.   30 tablet   6   . ranitidine (ZANTAC) 300 MG tablet   Oral   Take 300 mg by mouth 2 (two) times daily.           . traMADol (ULTRAM) 50 MG tablet   Oral   Take 1 tablet (50 mg total) by mouth every 8 (eight) hours as needed for pain.   90 tablet   1   . triamcinolone ointment (KENALOG) 0.5 %  Topical   Apply topically 2 (two) times daily.   45 g   0   . valACYclovir (VALTREX) 500 MG tablet   Oral   Take 1 tablet (500 mg total) by mouth 2 (two) times daily.   60 tablet   3   . verapamil (VERELAN PM) 360 MG 24 hr capsule   Oral   Take 1 capsule (360 mg total) by mouth at bedtime.   90 capsule   1   . ziprasidone (GEODON) 80 MG capsule   Oral   Take 1 capsule (80 mg total) by mouth 2 (two) times daily with a meal.   30 capsule   0     There were no vitals taken  for this visit.  Physical Exam  Constitutional: [name] oriented to person, place, and time. She appears well-developed and well-nourished.  HENT:  Head: Normocephalic and atraumatic.  Mouth/Throat: Oropharynx is clear and moist. No oropharyngeal exudate.  Uvula midline without obvious swelling  Eyes: EOM are normal. Pupils are equal, round, and reactive to light.  Neck: Normal range of motion. Neck supple. No tracheal deviation present. No thyromegaly present.  Cardiovascular: Regular rhythm and intact distal pulses.   Borderline bradycardic  Pulmonary/Chest: Effort normal and breath sounds normal. No stridor. No respiratory distress.  Abdominal:  Obese, s/nt/nd  Musculoskeletal: Normal range of motion. She exhibits no edema.  Lymphadenopathy:    She has no cervical adenopathy.  Neurological: [name] alert and oriented to person, place, and time.  Skin: Skin is warm and dry.    ED Course  Procedures (including critical care time)  Results for orders placed during the hospital encounter of 03/27/12  CBC      Result Value Range   WBC 10.8 (*) 4.0 - 10.5 K/uL   RBC 3.90  3.87 - 5.11 MIL/uL   Hemoglobin 10.7 (*) 12.0 - 15.0 g/dL   HCT 33.9 (*) 36.0 - 46.0 %   MCV 86.9  78.0 - 100.0 fL   MCH 27.4  26.0 - 34.0 pg   MCHC 31.6  30.0 - 36.0 g/dL   RDW 16.1 (*) 11.5 - 15.5 %   Platelets 398  150 - 400 K/uL  BASIC METABOLIC PANEL      Result Value Range   Sodium 136  135 - 145 mEq/L   Potassium 3.1 (*) 3.5 - 5.1 mEq/L   Chloride 100  96 - 112 mEq/L   CO2 22  19 - 32 mEq/L   Glucose, Bld 122 (*) 70 - 99 mg/dL   BUN 14  6 - 23 mg/dL   Creatinine, Ser 1.45 (*) 0.50 - 1.10 mg/dL   Calcium 9.1  8.4 - 10.5 mg/dL   GFR calc non Af Amer 41 (*) >90 mL/min   GFR calc Af Amer 47 (*) >90 mL/min  POCT I-STAT TROPONIN I      Result Value Range   Troponin i, poc 0.01  0.00 - 0.08 ng/mL   Comment 3            Dg Chest 2 View  03/27/2012  *RADIOLOGY REPORT*  Clinical Data: Allergic  reaction, shortness of breath, dizziness, history asthma, diabetes, hypertension, MI  CHEST - 2 VIEW  Comparison: 04/24/2011  Findings: Enlargement of cardiac silhouette. Mediastinal contours and pulmonary vascularity normal. Slight overpenetration at lung fields on AP view. No gross infiltrate, pleural effusion or pneumothorax. Bones unremarkable.  IMPRESSION: Enlargement of cardiac silhouette. No acute abnormalities.   Original Report  Authenticated By: Lavonia Dana, M.D.      Date: 03/27/2012  Rate: 68  Rhythm: normal sinus rhythm  QRS Axis: normal  Intervals: QT prolonged  ST/T Wave abnormalities: nonspecific ST changes  Conduction Disutrbances:none  Narrative Interpretation:   Old EKG Reviewed: unchanged   Lactate elevated 3.23 with persistent hypotension SBP 80s. After 3L fluids, BP 96/43  IVFs   D/w Family practice resident on call, plan admit - cont IVfs MDM  Syncope. Hypotension. Low HR 60s. Elevated lactate. hypokalemia  ECG, labs, imaging, UA  IVFs.   Transfer/ admit [facility]        Teressa Lower, MD 03/27/12 [phone]

## 2012-03-27 NOTE — ED Notes (Signed)
Patient is still unable to urinate 

## 2012-03-27 NOTE — ED Notes (Signed)
Per Dr [name] has history of low blood pressure as doses Tiffany [name] admit to this being her normal reading

## 2012-03-27 NOTE — ED Notes (Signed)
Spoke with pt in reference to transferring [facility]

## 2012-04-01 ENCOUNTER — Ambulatory Visit (INDEPENDENT_AMBULATORY_CARE_PROVIDER_SITE_OTHER): Payer: Medicare Other | Admitting: Family Medicine

## 2012-04-01 ENCOUNTER — Encounter: Payer: Self-pay | Admitting: Family Medicine

## 2012-04-01 VITALS — BP 155/86 | HR 108 | Temp 99.2°F | Wt 267.5 lb

## 2012-04-01 DIAGNOSIS — R300 Dysuria: Secondary | ICD-10-CM

## 2012-04-01 LAB — POCT URINALYSIS DIPSTICK
Bilirubin, UA: NEGATIVE
Blood, UA: NEGATIVE
Glucose, UA: NEGATIVE
Ketones, UA: NEGATIVE
Nitrite, UA: NEGATIVE
Protein, UA: NEGATIVE
Spec Grav, UA: 1.01
Urobilinogen, UA: 0.2
pH, UA: 5.5

## 2012-04-01 MED ORDER — CEPHALEXIN 500 MG PO CAPS: 500.0000 mg | ORAL_CAPSULE | Freq: Three times a day (TID) | ORAL | Status: DC

## 2012-04-01 NOTE — Progress Notes (Signed)
Patient ID: Tiffany [name], female   DOB: May 24, 1960, 52 y.o.   MRN: [id number] SUBJECTIVE: Tiffany [name] is a 52 y.o. female who complains of urinary frequency, urgency and dysuria x 7 days, without flank pain, fever, chills, or abnormal vaginal discharge or bleeding.   OBJECTIVE: Appears well, in no apparent distress.  Vital signs are normal. The abdomen is soft without tenderness, guarding, mass, rebound or organomegaly. No CVA tenderness or inguinal adenopathy noted. Urine dipstick shows positive for leukocytes.  Micro exam: not done due to inadequate .   ASSESSMENT: UTI uncomplicated without evidence of pyelonephritis  PLAN: Keflex x7 days.  If symptoms not improved, would rec referral to urologist for eval for interstitial cystitis (pt gives long history of bladder symptoms and is certain there is something wrong) - also push fluids, may use Pyridium OTC prn. Call or return to clinic prn if these symptoms worsen or fail to improve as anticipated.

## 2012-04-01 NOTE — Patient Instructions (Signed)
I have sent in your antibiotics. Take them 3 times per day for the next 7 days.

## 2012-04-05 ENCOUNTER — Ambulatory Visit (INDEPENDENT_AMBULATORY_CARE_PROVIDER_SITE_OTHER): Payer: Medicare Other | Admitting: Family Medicine

## 2012-04-05 ENCOUNTER — Encounter: Payer: Self-pay | Admitting: Family Medicine

## 2012-04-05 VITALS — BP 133/79 | HR 96 | Wt 262.0 lb

## 2012-04-05 DIAGNOSIS — E785 Hyperlipidemia, unspecified: Secondary | ICD-10-CM

## 2012-04-05 DIAGNOSIS — E669 Obesity, unspecified: Secondary | ICD-10-CM

## 2012-04-05 DIAGNOSIS — E119 Type 2 diabetes mellitus without complications: Secondary | ICD-10-CM

## 2012-04-05 DIAGNOSIS — L989 Disorder of the skin and subcutaneous tissue, unspecified: Secondary | ICD-10-CM

## 2012-04-05 DIAGNOSIS — T7840XA Allergy, unspecified, initial encounter: Secondary | ICD-10-CM

## 2012-04-05 DIAGNOSIS — I10 Essential (primary) hypertension: Secondary | ICD-10-CM

## 2012-04-05 DIAGNOSIS — R238 Other skin changes: Secondary | ICD-10-CM

## 2012-04-05 LAB — COMPREHENSIVE METABOLIC PANEL
ALT: 11 U/L (ref 0–35)
AST: 11 U/L (ref 0–37)
Albumin: 4 g/dL (ref 3.5–5.2)
Alkaline Phosphatase: 145 U/L — ABNORMAL HIGH (ref 39–117)
BUN: 18 mg/dL (ref 6–23)
CO2: 31 mEq/L (ref 19–32)
Calcium: 8.7 mg/dL (ref 8.4–10.5)
Chloride: 104 mEq/L (ref 96–112)
Creat: 1.57 mg/dL — ABNORMAL HIGH (ref 0.50–1.10)
Glucose, Bld: 86 mg/dL (ref 70–99)
Potassium: 4 mEq/L (ref 3.5–5.3)
Sodium: 143 mEq/L (ref 135–145)
Total Bilirubin: 0.2 mg/dL — ABNORMAL LOW (ref 0.3–1.2)
Total Protein: 6.6 g/dL (ref 6.0–8.3)

## 2012-04-05 LAB — LDL CHOLESTEROL, DIRECT: Direct LDL: 52 mg/dL

## 2012-04-05 LAB — POCT GLYCOSYLATED HEMOGLOBIN (HGB A1C): Hemoglobin A1C: 5.7

## 2012-04-05 NOTE — Patient Instructions (Addendum)
Thank you for coming in today, it was good to see you Your blood pressure looks fine today.  If they get more elevated pressures at [facility] write them down and bring them in for me to look at I will send in refills for your medications.   I think the numbness you have is from friction where your skin is rubbing together.  You can try a cream such as desitin, vaseline or baby powder to help protect that area.  F/u with me in 3 months.

## 2012-04-07 DIAGNOSIS — T7840XA Allergy, unspecified, initial encounter: Secondary | ICD-10-CM | POA: Insufficient documentation

## 2012-04-07 DIAGNOSIS — R238 Other skin changes: Secondary | ICD-10-CM | POA: Insufficient documentation

## 2012-04-07 MED ORDER — FUROSEMIDE 40 MG PO TABS: 40.0000 mg | ORAL_TABLET | Freq: Every day | ORAL | Status: DC

## 2012-04-07 MED ORDER — CYCLOBENZAPRINE HCL 10 MG PO TABS: 10.0000 mg | ORAL_TABLET | Freq: Three times a day (TID) | ORAL | Status: DC | PRN

## 2012-04-07 MED ORDER — LISINOPRIL 20 MG PO TABS: 20.0000 mg | ORAL_TABLET | Freq: Two times a day (BID) | ORAL | Status: DC

## 2012-04-07 MED ORDER — VERAPAMIL HCL ER 360 MG PO CP24: 360.0000 mg | ORAL_CAPSULE | Freq: Every day | ORAL | Status: DC

## 2012-04-07 MED ORDER — OXYBUTYNIN CHLORIDE 5 MG PO TABS: 5.0000 mg | ORAL_TABLET | Freq: Three times a day (TID) | ORAL | Status: DC

## 2012-04-07 MED ORDER — ATORVASTATIN CALCIUM 40 MG PO TABS: 40.0000 mg | ORAL_TABLET | Freq: Every day | ORAL | Status: DC

## 2012-04-07 MED ORDER — GABAPENTIN 300 MG PO CAPS: 300.0000 mg | ORAL_CAPSULE | Freq: Two times a day (BID) | ORAL | Status: DC

## 2012-04-07 MED ORDER — CLONIDINE HCL 0.2 MG PO TABS: 0.2000 mg | ORAL_TABLET | Freq: Three times a day (TID) | ORAL | Status: DC

## 2012-04-07 MED ORDER — POTASSIUM CHLORIDE CRYS ER 10 MEQ PO TBCR: 40.0000 meq | EXTENDED_RELEASE_TABLET | Freq: Once | ORAL | Status: DC

## 2012-04-07 NOTE — Assessment & Plan Note (Signed)
BP looks ok today, but evidently has had some elevated pressures at [facility].  I asked her to record these when she has this done there for me to look at.   Cr elevated during ED visit recently, check chemistry today.

## 2012-04-07 NOTE — Assessment & Plan Note (Signed)
Was seen in ED for allergic reaction recently, reportedly to hydroxyzine.  EDP note states that she was txferred to [facility] for admission and she says she stayed overnight, however I can not find any admission notes or discharge summary.  Her symptoms have resolved but had elevated cr at that time.  Check chemistry again today.

## 2012-04-07 NOTE — Assessment & Plan Note (Signed)
No rash or adenopathy in areas she complains about.  Complaints of pain and numnbess likely due to friction from excess tissue.  Discussed weight loss.

## 2012-04-07 NOTE — Assessment & Plan Note (Signed)
Check LDL today, continue atorvastatin.

## 2012-04-07 NOTE — Assessment & Plan Note (Signed)
Well controlled on metformin only.  Continue and follow up in 3 months.

## 2012-04-07 NOTE — Progress Notes (Signed)
  Subjective:    Patient ID: Tiffany [name], female    DOB: [birth year], 52 y.o.   MRN: [id number]  Hypertension    Here for routine follow up of  1. HTN:  CHRONIC HYPERTENSION  Disease Monitoring  Blood pressure range: Does not monitor at home but has bene told that her blood pressure has been elevated when she has been seen at [facility]  Chest pain: no   Dyspnea: no   Claudication: no   Medication compliance: yes  Medication Side Effects  Lightheadedness: no   Urinary frequency: yes   Edema: no   Preventitive Healthcare:  Exercise: no, minimal exercise   Diet Pattern: Poor  Salt Restriction: No    2. DM: CHRONIC DIABETES  Disease Monitoring  Blood Sugar Ranges: 90's-110  Polyuria: no   Visual problems: no   Medication Compliance: yes  Medication Side Effects  Hypoglycemia: no   Preventitive Health Care  Eye Exam: Due, plans to get soon      3. HLD:  Tolerating statin well.  Denies myalgias.    4. Groin/underarm pain:  Reports pain/swelling in underarm and groin area for the past couple of months.  Reports area is painful and "raw" feeling at times and numb feeling sometimes.  She denies any itching, open sores,  weight loss.  5. ED f/u:  Seen in ED a couple weeks ago for allergic reaction to hydroxyzine. Had shortness of breath with this and abnromal lab work.  Feel fine at this point.     Past Medical History  Diagnosis Date  . High blood pressure   . Incontinence   . Anxiety   . Manic depression   . Heart attack   . Diabetes mellitus     class 2  . Asthma   . Emphysema   . High cholesterol   . Stroke   . Sinus trouble   . Seasonal allergies   . Sleep apnea   . Disorder of nervous system   . Myocardial infarct   . Complication of anesthesia     decreased bp, decreased heart rate     Review of Systems Per HPI    Objective:   Physical Exam  Constitutional:  Morbidly obese female, nad   HENT:  Mouth/Throat: Oropharynx is clear and moist.   Neck: Neck supple. No thyromegaly present.  Cardiovascular: Normal rate and regular rhythm.   Pulmonary/Chest: Effort normal and breath sounds normal.  Musculoskeletal: She exhibits no edema.  Excess skin and adipose tissue at areas where she points to bothering her in her upper arm and upper thigh area.  No rash seen.  No adenopathy.    Lymphadenopathy:    She has no cervical adenopathy.  Neurological: [name] alert.          Assessment & Plan:

## 2012-04-07 NOTE — Assessment & Plan Note (Signed)
Not motivated for weight loss at this point. I discussed increasing exercise to help with other chronic medical conditions.

## 2012-04-22 ENCOUNTER — Telehealth: Payer: Self-pay | Admitting: Family Medicine

## 2012-04-22 NOTE — Telephone Encounter (Signed)
Waiting for pt to call back. Please ask, which medications? Thanks .Tiffany [name]

## 2012-04-22 NOTE — Telephone Encounter (Signed)
Pt is asking to speak to nurse about some medications that were called in wrong.

## 2012-04-23 ENCOUNTER — Telehealth: Payer: Self-pay | Admitting: *Deleted

## 2012-04-23 MED ORDER — TRAMADOL HCL 50 MG PO TABS: 50.0000 mg | ORAL_TABLET | Freq: Three times a day (TID) | ORAL | Status: DC | PRN

## 2012-04-23 MED ORDER — GABAPENTIN 300 MG PO CAPS: 300.0000 mg | ORAL_CAPSULE | Freq: Two times a day (BID) | ORAL | Status: DC

## 2012-04-23 MED ORDER — OXYBUTYNIN CHLORIDE 5 MG PO TABS: 5.0000 mg | ORAL_TABLET | Freq: Three times a day (TID) | ORAL | Status: DC

## 2012-04-23 NOTE — Telephone Encounter (Signed)
Will fwd. To PCP to clarify. Thanks. Javier Glazier, Gerrit Heck

## 2012-04-23 NOTE — Telephone Encounter (Signed)
Left message to return call. Please see message from Dr Zigmund Daniel.Busick, Tiffany [name]

## 2012-04-23 NOTE — Telephone Encounter (Signed)
Message copied by [place] Pink on Tue Apr 23, 2012  3:30 PM ------      Message from: MATTHEWS, CODY      Created: Tue Apr 23, 2012 11:23 AM       Creatinine remains elevated.  Please have her hold lisinopril and follow up with me. ------

## 2012-04-23 NOTE — Telephone Encounter (Signed)
Will send in 90 day supplies for Oxybutynin, gabapentin, and tramadol Will need to hold lisinopril, see results note for recent lab work about increased creatinine. I do not prescribe lorazepam for her and we have her listed as taking clonazepam. Atorvastatin, clonidine, furosemide were already sent in as 90 day supplies on 3/23.

## 2012-04-23 NOTE — Telephone Encounter (Signed)
Patient is calling back about her medication   She said that there are 7 or 8 that he wrote the wrong medication for.  They are Lisinopril, Oxybutin,Furosemide, Clonidine, Lorazepam, Gabapentin and Atorvastatin and then Tramadol.  She goes the [facility].  She needs a 3 month supply.

## 2012-04-26 ENCOUNTER — Encounter: Payer: Self-pay | Admitting: Family Medicine

## 2012-04-26 ENCOUNTER — Other Ambulatory Visit: Payer: Self-pay | Admitting: *Deleted

## 2012-04-26 ENCOUNTER — Ambulatory Visit (INDEPENDENT_AMBULATORY_CARE_PROVIDER_SITE_OTHER): Payer: Medicare Other | Admitting: Family Medicine

## 2012-04-26 VITALS — BP 110/73 | HR 87 | Ht 64.0 in | Wt 275.0 lb

## 2012-04-26 DIAGNOSIS — R799 Abnormal finding of blood chemistry, unspecified: Secondary | ICD-10-CM

## 2012-04-26 DIAGNOSIS — J45901 Unspecified asthma with (acute) exacerbation: Secondary | ICD-10-CM

## 2012-04-26 DIAGNOSIS — R7989 Other specified abnormal findings of blood chemistry: Secondary | ICD-10-CM

## 2012-04-26 DIAGNOSIS — I10 Essential (primary) hypertension: Secondary | ICD-10-CM

## 2012-04-26 LAB — BASIC METABOLIC PANEL
BUN: 15 mg/dL (ref 6–23)
CO2: 26 mEq/L (ref 19–32)
Calcium: 9.3 mg/dL (ref 8.4–10.5)
Chloride: 106 mEq/L (ref 96–112)
Creat: 0.89 mg/dL (ref 0.50–1.10)
Glucose, Bld: 105 mg/dL — ABNORMAL HIGH (ref 70–99)
Potassium: 4.4 mEq/L (ref 3.5–5.3)
Sodium: 143 mEq/L (ref 135–145)

## 2012-04-26 MED ORDER — ACYCLOVIR 5 % EX OINT: 1.0000 "application " | TOPICAL_OINTMENT | Freq: Three times a day (TID) | CUTANEOUS | Status: DC

## 2012-04-26 MED ORDER — PREDNISONE 50 MG PO TABS: ORAL_TABLET | ORAL | Status: DC

## 2012-04-26 MED ORDER — ALBUTEROL SULFATE (2.5 MG/3ML) 0.083% IN NEBU: 2.5000 mg | INHALATION_SOLUTION | Freq: Four times a day (QID) | RESPIRATORY_TRACT | Status: DC | PRN

## 2012-04-26 MED ORDER — ALBUTEROL SULFATE HFA 108 (90 BASE) MCG/ACT IN AERS: 2.0000 | INHALATION_SPRAY | Freq: Four times a day (QID) | RESPIRATORY_TRACT | Status: DC | PRN

## 2012-04-26 MED ORDER — TRIAMCINOLONE ACETONIDE 0.5 % EX OINT: TOPICAL_OINTMENT | Freq: Two times a day (BID) | CUTANEOUS | Status: DC

## 2012-04-26 MED ORDER — TRAMADOL HCL 50 MG PO TABS: 50.0000 mg | ORAL_TABLET | Freq: Three times a day (TID) | ORAL | Status: DC | PRN

## 2012-04-26 MED ORDER — BUDESONIDE-FORMOTEROL FUMARATE 160-4.5 MCG/ACT IN AERO: 2.0000 | INHALATION_SPRAY | Freq: Two times a day (BID) | RESPIRATORY_TRACT | Status: DC

## 2012-04-26 MED ORDER — FLUTICASONE PROPIONATE 50 MCG/ACT NA SUSP: 2.0000 | Freq: Every day | NASAL | Status: DC

## 2012-04-26 MED ORDER — MONTELUKAST SODIUM 10 MG PO TABS: 10.0000 mg | ORAL_TABLET | Freq: Every day | ORAL | Status: DC

## 2012-04-26 NOTE — Patient Instructions (Addendum)
Thank you for coming in today, it was good to see you STOP your lisinopril for now Follow up in 7-10 days so that I can recheck your blood pressure and renal function I am sending in a short dose of steroids for your breathing.

## 2012-04-28 DIAGNOSIS — R778 Other specified abnormalities of plasma proteins: Secondary | ICD-10-CM

## 2012-04-28 DIAGNOSIS — J45901 Unspecified asthma with (acute) exacerbation: Secondary | ICD-10-CM | POA: Insufficient documentation

## 2012-04-28 DIAGNOSIS — R7989 Other specified abnormal findings of blood chemistry: Secondary | ICD-10-CM

## 2012-04-28 HISTORY — DX: Other specified abnormal findings of blood chemistry: R79.89

## 2012-04-28 HISTORY — DX: Other specified abnormalities of plasma proteins: R77.8

## 2012-04-28 NOTE — Assessment & Plan Note (Signed)
Mild exacerbation, will have her continue albuterol and treat with 5 day burst of prednisone.

## 2012-04-28 NOTE — Assessment & Plan Note (Signed)
Creatinine most recently 1.57, 1.45 previously.  Baseline around 0.9.  Will have her hold ACE-I for now and recheck today.   COuld be due to hypotension that she experienced during ED visit a few weeks ago, resulting in ATN.

## 2012-04-28 NOTE — Progress Notes (Signed)
  Subjective:    Patient ID: Tiffany [name], female    DOB: 12/25/1960, 52 y.o.   MRN: [id number]  Hypertension  Asthma Her past medical history is significant for asthma.   1. Elevated Cr:  Patient was asked to come into clinic today because of elevated Cr on last two lab readings.  Has history of chf currently on ACE-I and lasix.  She reports that she feels fine.  Was seen in the ED a few weeks ago and had hypotension with elevated lactate, however it does not appear she was admitted at that time.  She reports that she is trying to drink fluids to stay hydrated.   2.  Wheezing:  Reports wheezing intermitttently over the past few days.  Has needed more albuterol over the past few days as well.  [name] using her symbicort daily as directed.  She denies sputum production, fever, chest pain.     Review of Systems Per HPI    Objective:   Physical Exam  Constitutional:  Obese female, nad   HENT:  Head: Normocephalic and atraumatic.  Cardiovascular: Normal rate, regular rhythm and normal heart sounds.   Pulmonary/Chest: Effort normal. No respiratory distress. She has wheezes (bilataeral diffuse wheezes).  Musculoskeletal: She exhibits no edema.          Assessment & Plan:

## 2012-05-06 ENCOUNTER — Other Ambulatory Visit: Payer: Self-pay | Admitting: *Deleted

## 2012-05-06 MED ORDER — VERAPAMIL HCL ER 360 MG PO CP24: 360.0000 mg | ORAL_CAPSULE | Freq: Every day | ORAL | Status: DC

## 2012-05-08 ENCOUNTER — Telehealth: Payer: Self-pay | Admitting: Family Medicine

## 2012-05-08 NOTE — Telephone Encounter (Signed)
Forward to PCP for refill

## 2012-05-08 NOTE — Telephone Encounter (Signed)
Patient is calling about her Cyclobensaprine.  The last time, it was only written for a 15 day supply and she needs it written for a 30 day or a 90 day supply.  The pharmacy should be sending over a request.

## 2012-05-10 MED ORDER — CYCLOBENZAPRINE HCL 10 MG PO TABS: 10.0000 mg | ORAL_TABLET | Freq: Three times a day (TID) | ORAL | Status: DC | PRN

## 2012-05-10 NOTE — Telephone Encounter (Signed)
Will fwd. To PCP for refills. Javier Glazier, Gerrit Heck

## 2012-05-10 NOTE — Telephone Encounter (Signed)
Checked box on 4/23 when in clinic.  No refills at that time.  Will send in cyclobenzaprine, but this is an as needed medication not for use all the time.  Verapamil sent in on 4/21, please see medications.  Will not send in naproxen given recent renal function as this can worsen.  She also has not followed up as instructed at her previous appointment.  Thanks

## 2012-05-10 NOTE — Telephone Encounter (Signed)
Patient is calling because not only has she not gotten her Cyclobensaprine, her pharmacy has faxed over refill requests for Verapamil and Naproxen since 4/22 which are all in her doctor box, but have not been addressed.  [name] very upset and frustrated.

## 2012-05-13 NOTE — Telephone Encounter (Signed)
Called pt and explained Dr.Matthews message. Pt yelled 'you tell that doctor, that I need my flexeril 3 x daily. He can't just change that. And he also needs to give me my other medications. What's wrong with him?' I explained again, the reasons (see below). Pt started yelling again and I said, that I would hang up, if she will not stop yelling..she said 'you can hang up, because I will find another doctor who will give me my flexeril'. Fwd to Dr.M. .Mauricia Area

## 2012-05-31 ENCOUNTER — Telehealth: Payer: Self-pay | Admitting: *Deleted

## 2012-05-31 NOTE — Telephone Encounter (Signed)
Fax placed in your box for med clarification requested by pharmacy. Thanks! Tildon Husky, RN-BSN

## 2012-05-31 NOTE — Telephone Encounter (Signed)
Completed form and faxed back.  Verapamil sent in already on 4/21.  Klor con approved.  Naproxen not approved.

## 2012-06-04 ENCOUNTER — Other Ambulatory Visit: Payer: Self-pay | Admitting: *Deleted

## 2012-06-04 MED ORDER — VALACYCLOVIR HCL 500 MG PO TABS: 500.0000 mg | ORAL_TABLET | Freq: Two times a day (BID) | ORAL | Status: DC

## 2012-06-12 ENCOUNTER — Other Ambulatory Visit: Payer: Self-pay | Admitting: Family Medicine

## 2012-06-12 MED ORDER — FUROSEMIDE 40 MG PO TABS: 40.0000 mg | ORAL_TABLET | Freq: Every day | ORAL | Status: DC

## 2012-06-17 ENCOUNTER — Ambulatory Visit (INDEPENDENT_AMBULATORY_CARE_PROVIDER_SITE_OTHER): Payer: Medicare Other | Admitting: Family Medicine

## 2012-06-17 ENCOUNTER — Other Ambulatory Visit: Payer: Self-pay | Admitting: Family Medicine

## 2012-06-17 ENCOUNTER — Encounter: Payer: Self-pay | Admitting: Family Medicine

## 2012-06-17 VITALS — BP 124/83 | HR 73 | Temp 99.4°F | Ht 64.0 in | Wt 264.0 lb

## 2012-06-17 DIAGNOSIS — M62838 Other muscle spasm: Secondary | ICD-10-CM

## 2012-06-17 DIAGNOSIS — G894 Chronic pain syndrome: Secondary | ICD-10-CM

## 2012-06-17 DIAGNOSIS — Z87442 Personal history of urinary calculi: Secondary | ICD-10-CM

## 2012-06-17 LAB — POCT URINALYSIS DIPSTICK
Blood, UA: NEGATIVE
Glucose, UA: NEGATIVE
Leukocytes, UA: NEGATIVE
Nitrite, UA: NEGATIVE
Protein, UA: 30
Spec Grav, UA: >=1.03
Urobilinogen, UA: 1
pH, UA: 6.5

## 2012-06-17 LAB — POCT UA - MICROSCOPIC ONLY: Epithelial cells, urine per micros: >20

## 2012-06-17 MED ORDER — CYCLOBENZAPRINE HCL 10 MG PO TABS: 10.0000 mg | ORAL_TABLET | Freq: Three times a day (TID) | ORAL | Status: DC | PRN

## 2012-06-17 NOTE — Progress Notes (Signed)
  Subjective:    Patient ID: Tiffany [name], female    DOB: 07-15-1960, 52 y.o.   MRN: [id number]  HPI:  Tiffany [name] comes in with several complaints.   She complains that her urine is a dark gold color.  She says she was told her kidneys are not filtering well and therefore thinks she must have a kidney stone again.  She had one several years ago.  However, she denies blood in her urine, increased back pain, nausea/vomiting, pain with urination.   Patient also complains about her muscle spasms.  She has been taking flexeril 10 mg po tid.  This helped, but then Dr. Zigmund Daniel did not refill it and now she has terrible muscle spasms in her neck, legs, and abdomen.  She wants to continue the flexeril.   She also comlpains of worsening pain since discontinuation of naproxen.  She says tramadol dose not help her pain as much as  Naproxen.  She wants to know what she is to do about her rheumatoid arthritis.   Past Medical History  Diagnosis Date  . High blood pressure   . Incontinence   . Anxiety   . Manic depression   . Heart attack   . Diabetes mellitus     class 2  . Asthma   . Emphysema   . High cholesterol   . Stroke   . Sinus trouble   . Seasonal allergies   . Sleep apnea   . Disorder of nervous system   . Myocardial infarct   . Complication of anesthesia     decreased bp, decreased heart rate    History  Substance Use Topics  . Smoking status: Former Smoker    Types: Cigarettes    Quit date: 04/23/1977  . Smokeless tobacco: Never Used  . Alcohol Use: No    Family History  Problem Relation Age of Onset  . Cancer Father     prostate  . Heart failure      cousin  . Cancer Mother     lung     ROS Pertinent items in HPI    Objective:  Physical Exam:  BP 124/83  Pulse 73  Temp(Src) 99.4 F (37.4 C) (Oral)  Ht 5\' 4"  (1.626 m)  Wt 264 lb (119.75 kg)  BMI 45.29 kg/m2 General appearance: alert, cooperative and no distress Head: Normocephalic, without obvious  abnormality, atraumatic Lungs: clear to auscultation bilaterally Heart: regular rate and rhythm, S1, S2 normal, no murmur, click, rub or gallop Pulses: 2+ and symmetric Back: No CVA tenderness, mild paraspinal muscle tenderness bilaterally.       Assessment & Plan:

## 2012-06-17 NOTE — Patient Instructions (Signed)
It was nice to meet you.  We will call you with the results of your urine test.  I have refilled your flexeril for one month's supply.   Please make an appointment to see Dr. Zigmund Daniel in 2 weeks to follow up your pain and muscle spasms.

## 2012-06-18 ENCOUNTER — Encounter: Payer: Self-pay | Admitting: Family Medicine

## 2012-06-18 DIAGNOSIS — Z87442 Personal history of urinary calculi: Secondary | ICD-10-CM | POA: Insufficient documentation

## 2012-06-18 DIAGNOSIS — G8929 Other chronic pain: Secondary | ICD-10-CM | POA: Insufficient documentation

## 2012-06-18 DIAGNOSIS — M62838 Other muscle spasm: Secondary | ICD-10-CM | POA: Insufficient documentation

## 2012-06-18 DIAGNOSIS — G894 Chronic pain syndrome: Secondary | ICD-10-CM | POA: Insufficient documentation

## 2012-06-18 HISTORY — DX: Chronic pain syndrome: G89.4

## 2012-06-18 LAB — DRUG SCREEN, URINE
Amphetamine Screen, Ur: NEGATIVE
Barbiturate Quant, Ur: NEGATIVE
Benzodiazepines.: NEGATIVE
Cocaine Metabolites: NEGATIVE
Creatinine,U: 521.94 mg/dL
Marijuana Metabolite: NEGATIVE
Methadone: NEGATIVE
Opiates: NEGATIVE
Phencyclidine (PCP): NEGATIVE
Propoxyphene: NEGATIVE

## 2012-06-18 NOTE — Assessment & Plan Note (Addendum)
Patient reports history of RA- I do not find this in her records, nor does she have a rheumatologist, or clinical findings of untreated RA.  I discussed with her that I do not recommend chronic NSAID use as she did have some renal insufficiency which improved after the medication was discontinued.  I have asked her to follow up with her PCP in a few weeks to further discuss her chronic pain, ? Rheumatoid Arthritis?  - Patient asking for something stronger than tramadol, insisting on TID flexeril (see muscle spasm).  Will check UDS to ensure no drug use before patient sees PCP for discussion about pain management.

## 2012-06-18 NOTE — Assessment & Plan Note (Signed)
No worsening back pain, no blood in urine, doubt this is Kidney stone, think patient is confused about what worsening kidney function (had creatinine bump in March) means, discussed at length the difference.

## 2012-06-18 NOTE — Assessment & Plan Note (Signed)
Patent complains of diffuse "muscle spasm" she says she has to take flexeril TID for this.  I discussed the sedating effects of muscle relaxants with her, will refill flexeril x 1 month and have her follow up with PCP.

## 2012-06-26 ENCOUNTER — Telehealth: Payer: Self-pay | Admitting: Family Medicine

## 2012-06-26 MED ORDER — TRAMADOL HCL 50 MG PO TABS: 50.0000 mg | ORAL_TABLET | Freq: Three times a day (TID) | ORAL | Status: DC | PRN

## 2012-06-26 NOTE — Telephone Encounter (Signed)
Rx mailed to pharmacy.

## 2012-06-26 NOTE — Telephone Encounter (Signed)
Pt wants tramadol prescription called in. Pharmacy states dr must call in order again. [facility]acy is [facility]. Please advise

## 2012-06-26 NOTE — Telephone Encounter (Signed)
Will forward to Dr Zigmund Daniel

## 2012-06-26 NOTE — Telephone Encounter (Signed)
Pharmacy has had to have physical prescription mailed to them in the past.  [place] [address]

## 2012-07-17 ENCOUNTER — Telehealth: Payer: Self-pay | Admitting: *Deleted

## 2012-07-17 NOTE — Telephone Encounter (Signed)
Fax requesting KLOR-CON 20 meq - not on MAR - please advise Tildon Husky, RN-BSN

## 2012-07-17 NOTE — Telephone Encounter (Signed)
I do not see Klor Con listed on the [facility] and do not see that it has ever been ordered. Will not order at this time. Patient's latest K was normal.   [name], PGY-2 Family Medicine Resident

## 2012-07-25 ENCOUNTER — Telehealth: Payer: Self-pay | Admitting: Family Medicine

## 2012-07-25 NOTE — Telephone Encounter (Signed)
Pt is calling about a refill being called in to [facility]. Dr. Zigmund Daniel is no longer on file with them now that he left. She was asking for Dr. Otis Dials call in her prescriptions to them .JW

## 2012-07-25 NOTE — Telephone Encounter (Signed)
Called pt and advised to schedule OV with Dr.Losq to meet her and refill her medications. Pt agreed and will schedule OV to meet her new PCP. Marland KitchenMauricia Area

## 2012-07-26 ENCOUNTER — Other Ambulatory Visit: Payer: Self-pay | Admitting: *Deleted

## 2012-07-26 MED ORDER — CYCLOBENZAPRINE HCL 10 MG PO TABS: 10.0000 mg | ORAL_TABLET | Freq: Three times a day (TID) | ORAL | Status: DC | PRN

## 2012-07-26 NOTE — Telephone Encounter (Signed)
Patient needs a follow up appointment with PCP before anymore refills

## 2012-07-29 ENCOUNTER — Other Ambulatory Visit: Payer: Self-pay | Admitting: *Deleted

## 2012-07-30 MED ORDER — METFORMIN HCL ER (MOD) 1000 MG PO TB24: 1000.0000 mg | ORAL_TABLET | Freq: Every day | ORAL | Status: DC

## 2012-08-07 ENCOUNTER — Other Ambulatory Visit: Payer: Self-pay | Admitting: Family Medicine

## 2012-08-07 NOTE — Progress Notes (Signed)
Faxed Rx refill for metformin er tab 1000mg  daily. 90 tablets, 1 year refill.  Sent to [facility] fax: [phone]. Will drop in to fax box.   [name], PGY-3 Family Medicine Resident

## 2012-09-03 ENCOUNTER — Ambulatory Visit: Payer: Medicare Other | Admitting: Family Medicine

## 2012-09-17 ENCOUNTER — Telehealth: Payer: Self-pay | Admitting: *Deleted

## 2012-09-17 NOTE — Telephone Encounter (Signed)
Klor-con not on med list. Potassium was normal in April 2014 and I do not see evidence that she was on potassium supplement at the time.  Patient can make appointment to discuss medicines and lab values.  Will not refill Klor-con at this time.   [name], PGY-3 Family Medicine Resident

## 2012-09-17 NOTE — Telephone Encounter (Signed)
Asking for refill on klor con - not on [facility] Tildon Husky, RN-BSN

## 2012-09-30 ENCOUNTER — Ambulatory Visit (INDEPENDENT_AMBULATORY_CARE_PROVIDER_SITE_OTHER): Payer: Medicare Other | Admitting: Family Medicine

## 2012-09-30 ENCOUNTER — Encounter: Payer: Self-pay | Admitting: Family Medicine

## 2012-09-30 VITALS — BP 115/83 | HR 67 | Ht 64.0 in | Wt 277.0 lb

## 2012-09-30 DIAGNOSIS — M25559 Pain in unspecified hip: Secondary | ICD-10-CM

## 2012-09-30 DIAGNOSIS — E119 Type 2 diabetes mellitus without complications: Secondary | ICD-10-CM

## 2012-09-30 DIAGNOSIS — G894 Chronic pain syndrome: Secondary | ICD-10-CM

## 2012-09-30 DIAGNOSIS — I10 Essential (primary) hypertension: Secondary | ICD-10-CM

## 2012-09-30 DIAGNOSIS — M62838 Other muscle spasm: Secondary | ICD-10-CM

## 2012-09-30 LAB — COMPREHENSIVE METABOLIC PANEL
ALT: 12 U/L (ref 0–35)
AST: 9 U/L (ref 0–37)
Albumin: 3.6 g/dL (ref 3.5–5.2)
Alkaline Phosphatase: 141 U/L — ABNORMAL HIGH (ref 39–117)
BUN: 16 mg/dL (ref 6–23)
CO2: 30 mEq/L (ref 19–32)
Calcium: 9.6 mg/dL (ref 8.4–10.5)
Chloride: 106 mEq/L (ref 96–112)
Creat: 0.99 mg/dL (ref 0.50–1.10)
Glucose, Bld: 91 mg/dL (ref 70–99)
Potassium: 4.5 mEq/L (ref 3.5–5.3)
Sodium: 140 mEq/L (ref 135–145)
Total Bilirubin: 0.2 mg/dL — ABNORMAL LOW (ref 0.3–1.2)
Total Protein: 6.4 g/dL (ref 6.0–8.3)

## 2012-09-30 LAB — C-REACTIVE PROTEIN: CRP: 0.8 mg/dL — ABNORMAL HIGH (ref <0.60)

## 2012-09-30 LAB — POCT GLYCOSYLATED HEMOGLOBIN (HGB A1C): Hemoglobin A1C: 5.8

## 2012-09-30 LAB — POCT SEDIMENTATION RATE: POCT SED RATE: 25 mm/hr — AB (ref 0–22)

## 2012-09-30 LAB — RHEUMATOID FACTOR: Rhuematoid fact SerPl-aCnc: <10 IU/mL (ref <=14)

## 2012-09-30 MED ORDER — NAPROXEN 500 MG PO TABS: 500.0000 mg | ORAL_TABLET | Freq: Two times a day (BID) | ORAL | Status: DC

## 2012-09-30 NOTE — Patient Instructions (Addendum)
For the body aches, I am sending to the pharmacy the naproxen and the baclofen.  We are checking a few labs to check for rheumatoid arthritis and thyroid, as well as potassium and kidney function.  I will call you if anything is out of the ordinary.   For the blood pressure, go back to clonidine 0.2mg  three times a day and continue the verapamil as before.  If your kidney function is ok, we might start you back on lisinopril.   Follow up back in 3 weeks.

## 2012-10-01 LAB — TSH: TSH: 1.847 u[IU]/mL (ref 0.350–4.500)

## 2012-10-01 LAB — CYCLIC CITRUL PEPTIDE ANTIBODY, IGG: Cyclic Citrullin Peptide Ab: <2 U/mL (ref 0.0–5.0)

## 2012-10-01 MED ORDER — MONTELUKAST SODIUM 10 MG PO TABS: 10.0000 mg | ORAL_TABLET | Freq: Every day | ORAL | Status: DC

## 2012-10-01 MED ORDER — BACLOFEN 10 MG PO TABS: 10.0000 mg | ORAL_TABLET | Freq: Three times a day (TID) | ORAL | Status: DC | PRN

## 2012-10-01 MED ORDER — ATORVASTATIN CALCIUM 40 MG PO TABS: 40.0000 mg | ORAL_TABLET | Freq: Every day | ORAL | Status: DC

## 2012-10-01 MED ORDER — ALBUTEROL SULFATE (2.5 MG/3ML) 0.083% IN NEBU: 2.5000 mg | INHALATION_SOLUTION | Freq: Four times a day (QID) | RESPIRATORY_TRACT | Status: DC | PRN

## 2012-10-01 MED ORDER — OMEPRAZOLE 40 MG PO CPDR: 40.0000 mg | DELAYED_RELEASE_CAPSULE | Freq: Every day | ORAL | Status: DC

## 2012-10-01 MED ORDER — ZIPRASIDONE HCL 80 MG PO CAPS: 80.0000 mg | ORAL_CAPSULE | Freq: Two times a day (BID) | ORAL | Status: DC

## 2012-10-01 MED ORDER — ALBUTEROL SULFATE HFA 108 (90 BASE) MCG/ACT IN AERS: 2.0000 | INHALATION_SPRAY | Freq: Four times a day (QID) | RESPIRATORY_TRACT | Status: DC | PRN

## 2012-10-01 MED ORDER — METFORMIN HCL ER (MOD) 1000 MG PO TB24: 1000.0000 mg | ORAL_TABLET | Freq: Every day | ORAL | Status: DC

## 2012-10-01 MED ORDER — POTASSIUM CHLORIDE ER 10 MEQ PO TBCR: 20.0000 meq | EXTENDED_RELEASE_TABLET | Freq: Every day | ORAL | Status: DC

## 2012-10-01 MED ORDER — OXYBUTYNIN CHLORIDE 5 MG PO TABS: 5.0000 mg | ORAL_TABLET | Freq: Three times a day (TID) | ORAL | Status: DC

## 2012-10-01 MED ORDER — FUROSEMIDE 40 MG PO TABS: 40.0000 mg | ORAL_TABLET | Freq: Every day | ORAL | Status: DC

## 2012-10-01 MED ORDER — VERAPAMIL HCL ER 360 MG PO CP24: 360.0000 mg | ORAL_CAPSULE | Freq: Every day | ORAL | Status: DC

## 2012-10-01 MED ORDER — NAPROXEN 500 MG PO TABS: 500.0000 mg | ORAL_TABLET | Freq: Two times a day (BID) | ORAL | Status: DC

## 2012-10-01 MED ORDER — BUDESONIDE-FORMOTEROL FUMARATE 160-4.5 MCG/ACT IN AERO: 2.0000 | INHALATION_SPRAY | Freq: Two times a day (BID) | RESPIRATORY_TRACT | Status: DC

## 2012-10-01 MED ORDER — CLONIDINE HCL 0.2 MG PO TABS: 0.2000 mg | ORAL_TABLET | Freq: Three times a day (TID) | ORAL | Status: DC

## 2012-10-01 MED ORDER — FEXOFENADINE HCL 60 MG PO TABS: 60.0000 mg | ORAL_TABLET | Freq: Every day | ORAL | Status: DC

## 2012-10-01 MED ORDER — GABAPENTIN 300 MG PO CAPS: 300.0000 mg | ORAL_CAPSULE | Freq: Three times a day (TID) | ORAL | Status: DC

## 2012-10-01 NOTE — Assessment & Plan Note (Signed)
Change flexeril to baclofen. Unclear etiology of muscle spasms. Check BMP for K level.

## 2012-10-01 NOTE — Assessment & Plan Note (Signed)
Well controled on metformin with A1C:5.8. Continue metformin 24hr 1000mg  daily.  Check TSH.

## 2012-10-01 NOTE — Assessment & Plan Note (Signed)
Instructed not to take more than 3 tablets of clonidine per day.  - go back to clonidine 0.2 tid - if BP stable and Cr stable at next visit, consider starting lisinopril for kidney protection.

## 2012-10-01 NOTE — Assessment & Plan Note (Addendum)
Patient with chronic pain and muscle stiffness. Differential includes OA vs RA vs fibromyalgia vs polymyalgia rheumatica.  - refilled naproxen, gabapentin  - will write for baclofen since flexeril is not covered - get RF, anti-ccp, esr and CRP to rule out rheumatoid process.

## 2012-10-01 NOTE — Progress Notes (Signed)
Patient ID: Sarissa Azlin    DOB: 01-16-1961, 52 y.o.   MRN: [id number] --- Subjective:  Aalana is a 52 y.o.female who presents to meet the new doctor and for refills on medication.   - generalized pain: has been having pain in her joints: mostly hips, knees bilaterally and shoulders. Pain occurs every day, worst with movement. She reports stiffness in her knees in the morning for over an hour after waking up. Pain is worst with getting up or sitting down. She also reports spasms in her muscles of her shoulders. She denies any worsening weakness in arms or legs. She takes naproxen and flexeril for her spasms.   - HTN: checked BP at home and it was in the 160's which worried her. She increased the amount of clonidine to 4 times a day or 3 in the morning and 3 in the evening. She denies any dizziness. No chest pain, no shortness of breath, no lower extremity edema.   ROS: see HPI Past Medical History: reviewed and updated medications and allergies. Social History: Tobacco: former smoker  Objective: Filed Vitals:   09/30/12 1129  BP: 115/83  Pulse: 67    Physical Examination:   General appearance - alert, well appearing, and in no distress Chest - clear to auscultation, no wheezes, rales or rhonchi, symmetric air entry Heart - normal rate, regular rhythm, normal S1, S2, no murmurs, rubs, clicks or gallops MSK - normal strength in fingers, biceps and triceps. Unable to raise arms above 90 degrees. Difficult reaching back of head bilaterally. Tenderness along upper and lower back on either side of spine Pain with extension and flexion of knees bilaterally

## 2012-10-25 ENCOUNTER — Ambulatory Visit: Payer: Medicare Other | Admitting: Family Medicine

## 2012-11-15 ENCOUNTER — Telehealth: Payer: Self-pay

## 2012-11-15 NOTE — Telephone Encounter (Signed)
Called pt and  1.)she request 'nerve pills'.  2.)She reports, that she is receiving disability already and needs to be cleared for the [employer]-bus.  Advised the pt to schedule OV. I told the pt, that she can bring in the form to her next OV and her PCP will review it. Pt agreed and will schedule OV. Javier Glazier, Gerrit Heck

## 2012-11-15 NOTE — Telephone Encounter (Signed)
Agree with patient needing office visit. Will discuss these issues with her at her office visit.  [name], PGY-3 Family Medicine Resident

## 2012-11-15 NOTE — Telephone Encounter (Signed)
Patient has questions about Clonidine and also would like to speak to nurse/ MD about disability.

## 2012-11-21 ENCOUNTER — Other Ambulatory Visit (HOSPITAL_COMMUNITY)
Admission: RE | Admit: 2012-11-21 | Discharge: 2012-11-21 | Disposition: A | Discharge status: Home / Self Care | Payer: Medicare Other | Source: Ambulatory Visit | Attending: Family Medicine | Admitting: Family Medicine

## 2012-11-21 ENCOUNTER — Ambulatory Visit (INDEPENDENT_AMBULATORY_CARE_PROVIDER_SITE_OTHER): Payer: Medicare Other | Admitting: Family Medicine

## 2012-11-21 VITALS — BP 121/86 | HR 72 | Temp 98.2°F | Ht 64.0 in | Wt 264.5 lb

## 2012-11-21 DIAGNOSIS — Z01419 Encounter for gynecological examination (general) (routine) without abnormal findings: Secondary | ICD-10-CM | POA: Insufficient documentation

## 2012-11-21 DIAGNOSIS — Z0000 Encounter for general adult medical examination without abnormal findings: Secondary | ICD-10-CM

## 2012-11-21 DIAGNOSIS — L739 Follicular disorder, unspecified: Secondary | ICD-10-CM

## 2012-11-21 DIAGNOSIS — Z124 Encounter for screening for malignant neoplasm of cervix: Secondary | ICD-10-CM

## 2012-11-21 DIAGNOSIS — F341 Dysthymic disorder: Secondary | ICD-10-CM

## 2012-11-21 DIAGNOSIS — L738 Other specified follicular disorders: Secondary | ICD-10-CM

## 2012-11-21 DIAGNOSIS — L678 Other hair color and hair shaft abnormalities: Secondary | ICD-10-CM

## 2012-11-21 DIAGNOSIS — Z1151 Encounter for screening for human papillomavirus (HPV): Secondary | ICD-10-CM | POA: Insufficient documentation

## 2012-11-21 DIAGNOSIS — F418 Other specified anxiety disorders: Secondary | ICD-10-CM

## 2012-11-21 MED ORDER — ALPRAZOLAM 1 MG PO TABS: 1.0000 mg | ORAL_TABLET | Freq: Every evening | ORAL | Status: DC | PRN

## 2012-11-21 MED ORDER — MUPIROCIN CALCIUM 2 % EX CREA: 1.0000 "application " | TOPICAL_CREAM | Freq: Three times a day (TID) | CUTANEOUS | Status: DC

## 2012-11-21 MED ORDER — GABAPENTIN 300 MG PO CAPS: 300.0000 mg | ORAL_CAPSULE | Freq: Three times a day (TID) | ORAL | Status: DC

## 2012-11-21 NOTE — Patient Instructions (Addendum)
I recommend that you get your mammogram done as soon as possible.  You will receive a letter with the results of the PAP smear.   For the bumps in your pubic area, I think it is more of a skin irritation around the hair. You can use warm compresses on it and keep it clean and dry. I am sending a prescription for a cream called mupirocin to use on the little areas if it doesn't get better on its own.

## 2012-11-25 ENCOUNTER — Telehealth: Payer: Self-pay

## 2012-11-25 DIAGNOSIS — L739 Follicular disorder, unspecified: Secondary | ICD-10-CM | POA: Insufficient documentation

## 2012-11-25 DIAGNOSIS — Z0000 Encounter for general adult medical examination without abnormal findings: Secondary | ICD-10-CM | POA: Insufficient documentation

## 2012-11-25 NOTE — Telephone Encounter (Signed)
Patient states that ever since her appt on 11/6 patient has been bleeding and her urine has a odd smell. Please call patient.

## 2012-11-25 NOTE — Progress Notes (Signed)
Patient ID: Tiffany [name]    DOB: [birth year], 52 y.o.   MRN: [id number] --- Subjective:  Tiffany [name] is a 52 y.o.female who presents with the following concerns:  - [employer] bus paperwork to be filled out - anxiety: refill for alprazolam. She currently takes geodonfor her bipolar/schizophrenia. Feels like she cannot function at times due to anxiety. Takes alprazolam once a day.  - request for PAP: patient wanting PAP smear [place] Hysterectomy was done for fibroids. No h/o abnormal PAP smears She reports some lesions in her vaginal area that have been present for a few days. Non tender, itching at times No mammograms.  Family history: maternal aunt significant for breast ca.    ROS: see HPI Past Medical History: reviewed and updated medications and allergies. Social History: Tobacco: former smoker discontinued in 2007.   Objective: Filed Vitals:   11/21/12 1132  BP: 121/86  Pulse: 72  Temp: 98.2 F (36.8 C)    Physical Examination:   General appearance - alert, psychomotor delay, no acute distress Chest - clear to auscultation, no wheezes, rales or rhonchi, symmetric air entry Heart - normal rate, regular rhythm, normal S1, S2, no murmurs, rubs, clicks or gallops Abdomen - soft, nontender, nondistended, no masses or organomegaly Breasts: breasts appear normal, no suspicious masses, no skin or nipple changes or axillary nodes. Pelvic exam: normal external genitalia, vulva, vagina, vaginal cuff difficult to visualize.  Areas of irritation around follicle

## 2012-11-25 NOTE — Assessment & Plan Note (Signed)
PAP smear done at patient's insistence despite hysterectomy.  Will get pap off cuff.  Mammogram resources also given.

## 2012-11-25 NOTE — Assessment & Plan Note (Signed)
Warm compresses and mupirocin if not better

## 2012-11-25 NOTE — Assessment & Plan Note (Signed)
Patient persistent about alprazolam use. Told her that psychiatry should be prescribing it. She told me that her psychiatrist at [facility] doesn't want to prescribe it to her.  Told her I would only prescribe it this coming month and that she is going to need to discuss it with her psychiatrist.

## 2012-11-26 NOTE — Telephone Encounter (Signed)
Please call patient back and let her know that there can be some bleeding after a pap but that if she continues to have foul smelling urine and blood, she should be evaluated to make sure she doesn't have a urinary tract infection.  Also, SCAT paperwork was sent today.   [name], PGY-3 Family Medicine Resident

## 2012-11-26 NOTE — Telephone Encounter (Signed)
Will fwd. To Dr.Losq for review. Javier Glazier, Gerrit Heck

## 2012-11-27 ENCOUNTER — Encounter: Payer: Self-pay | Admitting: Family Medicine

## 2012-11-27 NOTE — Telephone Encounter (Signed)
Pt continues to complain of foul odor from her vagina.  Instructed pt to schelude an OV .  the patient agreed.   .memd

## 2012-12-02 ENCOUNTER — Telehealth: Payer: Self-pay | Admitting: Family Medicine

## 2012-12-02 NOTE — Telephone Encounter (Signed)
Called pt. No answer. Please see Dr.Losq's message, if pt calls back. Thanks. Javier Glazier, Gerrit Heck

## 2012-12-02 NOTE — Telephone Encounter (Signed)
Pt called because her left side breast is aching and would like to know what that means. jw

## 2012-12-02 NOTE — Telephone Encounter (Signed)
Please let patient know that I sent the fax application on [phone]  [name], PGY-3 Family Medicine Resident

## 2012-12-02 NOTE — Telephone Encounter (Signed)
Called pt and she complained about her left breast. Complains of pain in the left breast. Denies fever, discharge, lumps. Pt never had a mammogram. I advised the pt to schedule OV to have her breast examined. Pt agreed. ALSO, pt reports that she has given Dr.Losq a form (application for riding the SCAT bus) and she wants to know, if Dr.Losq filled it out and fax it.  Will fwd. To Dr.Losq for review. Javier Glazier, Gerrit Heck

## 2012-12-10 ENCOUNTER — Telehealth: Payer: Self-pay | Admitting: Family Medicine

## 2012-12-10 MED ORDER — GABAPENTIN 300 MG PO CAPS
300.0000 mg | ORAL_CAPSULE | Freq: Three times a day (TID) | ORAL | Status: DC
Start: 2012-12-10 — End: 2013-01-06

## 2012-12-10 NOTE — Telephone Encounter (Signed)
Will fwd. To PCP for refill. Javier Glazier, Gerrit Heck

## 2012-12-10 NOTE — Telephone Encounter (Signed)
Sent refill for gabapentin for 270 capsules to [facility] [place], [place]. Please let patient know. If this is not the right pharmacy, please let us know.  Thank you!  [name], PGY-3 Family Medicine Resident

## 2012-12-10 NOTE — Telephone Encounter (Signed)
Pt called and needs a refill of her gabapentin and would like it in a 90 day supply so she can get it for free. [name] out. Tiffany [name]

## 2012-12-11 ENCOUNTER — Telehealth: Payer: Self-pay | Admitting: Family Medicine

## 2012-12-11 DIAGNOSIS — G4733 Obstructive sleep apnea (adult) (pediatric): Secondary | ICD-10-CM

## 2012-12-11 NOTE — Telephone Encounter (Signed)
Pt notified.  Martavius Lusty L, CMA  

## 2012-12-11 NOTE — Telephone Encounter (Signed)
Patient states she had 2 seizures yesterday, and did not seek treatment. She now would like to know what she needs to do. Please call patient.

## 2012-12-11 NOTE — Telephone Encounter (Signed)
Called patient back due to report of seizure activity yesterday.  Seizure: lasted a couple minutes unconscious, had 2 seizures yesterday. She doesn't recall any details of this. Has been out of her gabapentin the day of the seizure and the day before that because she did not get the 90 day supply of medication needed to be cost free.  Didn't take gabapentin that day or the day before.   She states that yesterday she has had slurred speech and difficulty moving around due to weakness. The slurred speech has resolved but the difficulty moving around has not.   Sleep apnea: has CPAP machine from 3 years ago. She states that she now is having apnea while on the machine. Will order new sleep study for new titration.   I did not realize that patient had a seizure disorder since this was not on her problem list. Her gabapentin was apparently initially prescribed for seizure and chronic pain.  Given description of unsteadiness of gait and recurrent seizure yesterday, recommended that patient go to the emergency room tonight. Patient expressed understanding and agreed with plan.   [name], PGY-3 Family Medicine Resident

## 2012-12-11 NOTE — Telephone Encounter (Signed)
Will fwd to PCP.  Laquenta Whitsell L, CMA  

## 2013-01-03 ENCOUNTER — Encounter (HOSPITAL_COMMUNITY): Payer: Self-pay | Admitting: Emergency Medicine

## 2013-01-03 ENCOUNTER — Emergency Department (HOSPITAL_COMMUNITY): Payer: Medicare Other

## 2013-01-03 ENCOUNTER — Inpatient Hospital Stay (HOSPITAL_COMMUNITY)
Admission: EM | Admit: 2013-01-03 | Discharge: 2013-01-06 | DRG: 155 | Disposition: A | Discharge status: Home / Self Care | Payer: Medicare Other | Attending: Family Medicine | Admitting: Family Medicine

## 2013-01-03 DIAGNOSIS — Z882 Allergy status to sulfonamides status: Secondary | ICD-10-CM

## 2013-01-03 DIAGNOSIS — E1142 Type 2 diabetes mellitus with diabetic polyneuropathy: Secondary | ICD-10-CM | POA: Diagnosis present

## 2013-01-03 DIAGNOSIS — E1149 Type 2 diabetes mellitus with other diabetic neurological complication: Secondary | ICD-10-CM | POA: Diagnosis present

## 2013-01-03 DIAGNOSIS — E1140 Type 2 diabetes mellitus with diabetic neuropathy, unspecified: Secondary | ICD-10-CM

## 2013-01-03 DIAGNOSIS — Z87891 Personal history of nicotine dependence: Secondary | ICD-10-CM

## 2013-01-03 DIAGNOSIS — I10 Essential (primary) hypertension: Secondary | ICD-10-CM | POA: Diagnosis present

## 2013-01-03 DIAGNOSIS — G40909 Epilepsy, unspecified, not intractable, without status epilepticus: Secondary | ICD-10-CM | POA: Diagnosis present

## 2013-01-03 DIAGNOSIS — Z6841 Body Mass Index (BMI) 40.0 and over, adult: Secondary | ICD-10-CM

## 2013-01-03 DIAGNOSIS — E669 Obesity, unspecified: Secondary | ICD-10-CM

## 2013-01-03 DIAGNOSIS — G4733 Obstructive sleep apnea (adult) (pediatric): Principal | ICD-10-CM | POA: Diagnosis present

## 2013-01-03 DIAGNOSIS — G894 Chronic pain syndrome: Secondary | ICD-10-CM | POA: Diagnosis present

## 2013-01-03 DIAGNOSIS — R7989 Other specified abnormal findings of blood chemistry: Secondary | ICD-10-CM

## 2013-01-03 DIAGNOSIS — Z733 Stress, not elsewhere classified: Secondary | ICD-10-CM

## 2013-01-03 DIAGNOSIS — F319 Bipolar disorder, unspecified: Secondary | ICD-10-CM

## 2013-01-03 DIAGNOSIS — F341 Dysthymic disorder: Secondary | ICD-10-CM | POA: Diagnosis present

## 2013-01-03 DIAGNOSIS — I509 Heart failure, unspecified: Secondary | ICD-10-CM | POA: Diagnosis present

## 2013-01-03 DIAGNOSIS — Z8673 Personal history of transient ischemic attack (TIA), and cerebral infarction without residual deficits: Secondary | ICD-10-CM

## 2013-01-03 DIAGNOSIS — T424X5A Adverse effect of benzodiazepines, initial encounter: Secondary | ICD-10-CM | POA: Diagnosis not present

## 2013-01-03 DIAGNOSIS — E119 Type 2 diabetes mellitus without complications: Secondary | ICD-10-CM

## 2013-01-03 DIAGNOSIS — J438 Other emphysema: Secondary | ICD-10-CM | POA: Diagnosis present

## 2013-01-03 DIAGNOSIS — T43205A Adverse effect of unspecified antidepressants, initial encounter: Secondary | ICD-10-CM | POA: Diagnosis not present

## 2013-01-03 DIAGNOSIS — Z789 Other specified health status: Secondary | ICD-10-CM

## 2013-01-03 DIAGNOSIS — Z79899 Other long term (current) drug therapy: Secondary | ICD-10-CM

## 2013-01-03 DIAGNOSIS — Z886 Allergy status to analgesic agent status: Secondary | ICD-10-CM

## 2013-01-03 DIAGNOSIS — E785 Hyperlipidemia, unspecified: Secondary | ICD-10-CM | POA: Diagnosis present

## 2013-01-03 DIAGNOSIS — I5032 Chronic diastolic (congestive) heart failure: Secondary | ICD-10-CM | POA: Diagnosis present

## 2013-01-03 DIAGNOSIS — E662 Morbid (severe) obesity with alveolar hypoventilation: Secondary | ICD-10-CM | POA: Diagnosis present

## 2013-01-03 DIAGNOSIS — J45909 Unspecified asthma, uncomplicated: Secondary | ICD-10-CM | POA: Diagnosis present

## 2013-01-03 DIAGNOSIS — R404 Transient alteration of awareness: Secondary | ICD-10-CM | POA: Diagnosis not present

## 2013-01-03 DIAGNOSIS — I252 Old myocardial infarction: Secondary | ICD-10-CM

## 2013-01-03 DIAGNOSIS — R569 Unspecified convulsions: Secondary | ICD-10-CM

## 2013-01-03 DIAGNOSIS — M62838 Other muscle spasm: Secondary | ICD-10-CM

## 2013-01-03 LAB — CBC
HCT: 36 % (ref 36.0–46.0)
Hemoglobin: 11.2 g/dL — ABNORMAL LOW (ref 12.0–15.0)
MCH: 27.6 pg (ref 26.0–34.0)
MCHC: 31.1 g/dL (ref 30.0–36.0)
MCV: 88.7 fL (ref 78.0–100.0)
Platelets: 370 10*3/uL (ref 150–400)
RBC: 4.06 MIL/uL (ref 3.87–5.11)
RDW: 15.5 % (ref 11.5–15.5)
WBC: 11 10*3/uL — ABNORMAL HIGH (ref 4.0–10.5)

## 2013-01-03 LAB — POCT I-STAT TROPONIN I: Troponin i, poc: 0 ng/mL (ref 0.00–0.08)

## 2013-01-03 NOTE — ED Notes (Signed)
The pt is c/o sob and multiple seizures for 3 months.  The pt is not any seizure meds  And she has sleep apnea and she has had progressive sob for the pasy week

## 2013-01-04 ENCOUNTER — Encounter (HOSPITAL_COMMUNITY): Payer: Self-pay | Admitting: Rehabilitation

## 2013-01-04 ENCOUNTER — Emergency Department (HOSPITAL_COMMUNITY): Payer: Medicare Other

## 2013-01-04 DIAGNOSIS — G4733 Obstructive sleep apnea (adult) (pediatric): Principal | ICD-10-CM

## 2013-01-04 DIAGNOSIS — I519 Heart disease, unspecified: Secondary | ICD-10-CM

## 2013-01-04 DIAGNOSIS — R569 Unspecified convulsions: Secondary | ICD-10-CM | POA: Diagnosis present

## 2013-01-04 DIAGNOSIS — Z733 Stress, not elsewhere classified: Secondary | ICD-10-CM

## 2013-01-04 DIAGNOSIS — M62838 Other muscle spasm: Secondary | ICD-10-CM

## 2013-01-04 LAB — POCT I-STAT 3, ART BLOOD GAS (G3+)
Acid-Base Excess: 4 mmol/L — ABNORMAL HIGH (ref 0.0–2.0)
Bicarbonate: 30.5 mEq/L — ABNORMAL HIGH (ref 20.0–24.0)
O2 Saturation: 91 %
Patient temperature: 98.6
TCO2: 32 mmol/L (ref 0–100)
pCO2 arterial: 51.1 mmHg — ABNORMAL HIGH (ref 35.0–45.0)
pH, Arterial: 7.383 (ref 7.350–7.450)
pO2, Arterial: 63 mmHg — ABNORMAL LOW (ref 80.0–100.0)

## 2013-01-04 LAB — COMPREHENSIVE METABOLIC PANEL
ALT: 12 U/L (ref 0–35)
AST: 10 U/L (ref 0–37)
Albumin: 3.3 g/dL — ABNORMAL LOW (ref 3.5–5.2)
Alkaline Phosphatase: 152 U/L — ABNORMAL HIGH (ref 39–117)
BUN: 13 mg/dL (ref 6–23)
CO2: 30 mEq/L (ref 19–32)
Calcium: 8.9 mg/dL (ref 8.4–10.5)
Chloride: 102 mEq/L (ref 96–112)
Creatinine, Ser: 0.91 mg/dL (ref 0.50–1.10)
GFR calc Af Amer: 82 mL/min — ABNORMAL LOW (ref >90)
GFR calc non Af Amer: 71 mL/min — ABNORMAL LOW (ref >90)
Glucose, Bld: 95 mg/dL (ref 70–99)
Potassium: 3.8 mEq/L (ref 3.5–5.1)
Sodium: 141 mEq/L (ref 135–145)
Total Bilirubin: 0.1 mg/dL — ABNORMAL LOW (ref 0.3–1.2)
Total Protein: 6.7 g/dL (ref 6.0–8.3)

## 2013-01-04 LAB — GLUCOSE, CAPILLARY
Glucose-Capillary: 82 mg/dL (ref 70–99)
Glucose-Capillary: 96 mg/dL (ref 70–99)
Glucose-Capillary: 109 mg/dL — ABNORMAL HIGH (ref 70–99)
Glucose-Capillary: 132 mg/dL — ABNORMAL HIGH (ref 70–99)

## 2013-01-04 LAB — URINALYSIS, ROUTINE W REFLEX MICROSCOPIC
Bilirubin Urine: NEGATIVE
Glucose, UA: NEGATIVE mg/dL
Hgb urine dipstick: NEGATIVE
Ketones, ur: NEGATIVE mg/dL
Leukocytes, UA: NEGATIVE
Nitrite: NEGATIVE
Protein, ur: NEGATIVE mg/dL
Specific Gravity, Urine: 1.009 (ref 1.005–1.030)
Urobilinogen, UA: 0.2 mg/dL (ref 0.0–1.0)
pH: 7 (ref 5.0–8.0)

## 2013-01-04 LAB — PRO B NATRIURETIC PEPTIDE: Pro B Natriuretic peptide (BNP): 51.2 pg/mL (ref 0–125)

## 2013-01-04 MED ORDER — INSULIN ASPART 100 UNIT/ML ~~LOC~~ SOLN
0.0000 [IU] | Freq: Three times a day (TID) | SUBCUTANEOUS | Status: DC
  Administered 2013-01-05: 2 [IU] via SUBCUTANEOUS

## 2013-01-04 MED ORDER — ACETAMINOPHEN 650 MG RE SUPP: 650.0000 mg | RECTAL | Status: DC | PRN

## 2013-01-04 MED ORDER — HEPARIN SODIUM (PORCINE) 5000 UNIT/ML IJ SOLN
5000.0000 [IU] | Freq: Three times a day (TID) | INTRAMUSCULAR | Status: DC
  Administered 2013-01-04 – 2013-01-06 (×8): 5000 [IU] via SUBCUTANEOUS
  Filled 2013-01-04 (×10): qty 1

## 2013-01-04 MED ORDER — ACETAMINOPHEN 325 MG PO TABS
650.0000 mg | ORAL_TABLET | Freq: Once | ORAL | Status: AC
  Administered 2013-01-04: 650 mg via ORAL
  Filled 2013-01-04: qty 2

## 2013-01-04 MED ORDER — GABAPENTIN 400 MG PO CAPS
400.0000 mg | ORAL_CAPSULE | Freq: Three times a day (TID) | ORAL | Status: DC
  Administered 2013-01-04 – 2013-01-06 (×6): 400 mg via ORAL
  Filled 2013-01-04 (×7): qty 1

## 2013-01-04 MED ORDER — TRAZODONE HCL 150 MG PO TABS
150.0000 mg | ORAL_TABLET | Freq: Two times a day (BID) | ORAL | Status: DC
  Administered 2013-01-04 – 2013-01-06 (×4): 150 mg via ORAL
  Filled 2013-01-04 (×6): qty 1

## 2013-01-04 MED ORDER — LORATADINE 10 MG PO TABS
10.0000 mg | ORAL_TABLET | Freq: Every day | ORAL | Status: DC
  Administered 2013-01-04 – 2013-01-06 (×3): 10 mg via ORAL
  Filled 2013-01-04 (×3): qty 1

## 2013-01-04 MED ORDER — FUROSEMIDE 40 MG PO TABS
40.0000 mg | ORAL_TABLET | Freq: Every day | ORAL | Status: DC
  Administered 2013-01-04 – 2013-01-06 (×3): 40 mg via ORAL
  Filled 2013-01-04 (×3): qty 1

## 2013-01-04 MED ORDER — ALPRAZOLAM 0.5 MG PO TABS
1.0000 mg | ORAL_TABLET | Freq: Every evening | ORAL | Status: DC | PRN
  Administered 2013-01-04: 1 mg via ORAL
  Filled 2013-01-04 (×2): qty 2

## 2013-01-04 MED ORDER — GABAPENTIN 300 MG PO CAPS
300.0000 mg | ORAL_CAPSULE | Freq: Three times a day (TID) | ORAL | Status: DC
  Administered 2013-01-04 (×2): 300 mg via ORAL
  Filled 2013-01-04 (×3): qty 1

## 2013-01-04 MED ORDER — FLUTICASONE PROPIONATE 50 MCG/ACT NA SUSP
2.0000 | Freq: Every day | NASAL | Status: DC
  Administered 2013-01-04 – 2013-01-06 (×3): 2 via NASAL
  Filled 2013-01-04: qty 16

## 2013-01-04 MED ORDER — CLONAZEPAM 1 MG PO TABS
1.0000 mg | ORAL_TABLET | Freq: Three times a day (TID) | ORAL | Status: DC
  Administered 2013-01-04 – 2013-01-06 (×8): 1 mg via ORAL
  Filled 2013-01-04 (×8): qty 1

## 2013-01-04 MED ORDER — SODIUM CHLORIDE 0.9 % IV SOLN
75.0000 mL/h | INTRAVENOUS | Status: DC
  Administered 2013-01-04: 75 mL/h via INTRAVENOUS

## 2013-01-04 MED ORDER — ZIPRASIDONE HCL 80 MG PO CAPS
80.0000 mg | ORAL_CAPSULE | Freq: Two times a day (BID) | ORAL | Status: DC
  Administered 2013-01-04 – 2013-01-06 (×5): 80 mg via ORAL
  Filled 2013-01-04 (×7): qty 1

## 2013-01-04 MED ORDER — PANTOPRAZOLE SODIUM 40 MG PO TBEC
80.0000 mg | DELAYED_RELEASE_TABLET | Freq: Every day | ORAL | Status: DC
  Administered 2013-01-04 – 2013-01-06 (×3): 80 mg via ORAL
  Filled 2013-01-04 (×3): qty 2

## 2013-01-04 MED ORDER — CLONIDINE HCL 0.2 MG PO TABS
0.2000 mg | ORAL_TABLET | Freq: Three times a day (TID) | ORAL | Status: DC
  Administered 2013-01-04 – 2013-01-06 (×8): 0.2 mg via ORAL
  Filled 2013-01-04 (×9): qty 1

## 2013-01-04 MED ORDER — ACETAMINOPHEN 325 MG PO TABS: 650.0000 mg | ORAL_TABLET | ORAL | Status: DC | PRN

## 2013-01-04 MED ORDER — ATORVASTATIN CALCIUM 40 MG PO TABS
40.0000 mg | ORAL_TABLET | Freq: Every day | ORAL | Status: DC
  Administered 2013-01-04 – 2013-01-06 (×3): 40 mg via ORAL
  Filled 2013-01-04 (×3): qty 1

## 2013-01-04 MED ORDER — BUDESONIDE-FORMOTEROL FUMARATE 160-4.5 MCG/ACT IN AERO
2.0000 | INHALATION_SPRAY | Freq: Two times a day (BID) | RESPIRATORY_TRACT | Status: DC
  Administered 2013-01-04 – 2013-01-06 (×5): 2 via RESPIRATORY_TRACT
  Filled 2013-01-04: qty 6

## 2013-01-04 MED ORDER — BACLOFEN 10 MG PO TABS
10.0000 mg | ORAL_TABLET | Freq: Three times a day (TID) | ORAL | Status: DC | PRN
  Filled 2013-01-04: qty 1

## 2013-01-04 MED ORDER — VERAPAMIL HCL ER 120 MG PO TBCR
360.0000 mg | EXTENDED_RELEASE_TABLET | Freq: Every day | ORAL | Status: DC
Start: 2013-01-04 — End: 2013-01-06
  Administered 2013-01-04 – 2013-01-05 (×2): 360 mg via ORAL
  Filled 2013-01-04 (×3): qty 3

## 2013-01-04 MED ORDER — ALBUTEROL SULFATE HFA 108 (90 BASE) MCG/ACT IN AERS: 2.0000 | INHALATION_SPRAY | Freq: Four times a day (QID) | RESPIRATORY_TRACT | Status: DC | PRN

## 2013-01-04 MED ORDER — OXYBUTYNIN CHLORIDE 5 MG PO TABS
5.0000 mg | ORAL_TABLET | Freq: Three times a day (TID) | ORAL | Status: DC
  Administered 2013-01-04 – 2013-01-06 (×8): 5 mg via ORAL
  Filled 2013-01-04 (×9): qty 1

## 2013-01-04 MED ORDER — MONTELUKAST SODIUM 10 MG PO TABS
10.0000 mg | ORAL_TABLET | Freq: Every day | ORAL | Status: DC
  Administered 2013-01-04 – 2013-01-05 (×2): 10 mg via ORAL
  Filled 2013-01-04 (×3): qty 1

## 2013-01-04 NOTE — Evaluation (Signed)
Occupational Therapy Evaluation Patient Details Name: Tiffany [name] MRN: [id number] DOB: [birth year] Today's Date: 01/04/2013 Time: [id number] OT Time Calculation (min): 24 min  OT Assessment / Plan / Recommendation History of present illness 52 yo female admitted with sleep seizures and feels paralyzed and is unable to wake up.    Clinical Impression   PT admitted with seizure and feels paralyzed due to inability to wake up. Pt currently with functional limitiations due to the deficits listed below (see OT problem list).  Pt will benefit from skilled OT to increase their independence and safety with adls and balance to allow discharge home when appropriate. Pt able to complete task when aroused. Pt very limited by arousal this session.     OT Assessment  Patient needs continued OT Services    Follow Up Recommendations  No OT follow up    Barriers to Discharge      Equipment Recommendations  None recommended by OT    Recommendations for Other Services    Frequency  Min 2X/week    Precautions / Restrictions Precautions Precautions: Fall   Pertinent Vitals/Pain Fatigue reported    ADL  Eating/Feeding: Set up Where Assessed - Eating/Feeding: Chair (aroual level decr only limiting factor) Grooming: Wash/dry hands;Wash/dry face;Set up Where Assessed - Grooming: Supported sitting Toilet Transfer: Minimal assistance Toilet Transfer Method: Sit to Loss adjuster, chartered: Raised toilet seat with arms (or 3-in-1 over toilet) Equipment Used: Gait belt;Cane Transfers/Ambulation Related to ADLs: pt ambulating with superivison and decr arousal Mod (A). Pt limited by arousal ADL Comments: Pt is able to complete adls , bed mobitlity, sit<>Stand at independent level if aroused. Pt closing eyse and reporting fatigue    OT Diagnosis: Generalized weakness  OT Problem List: Decreased strength;Decreased activity tolerance;Impaired balance (sitting and/or standing);Decreased safety  awareness;Decreased knowledge of use of DME or AE;Decreased knowledge of precautions;Obesity OT Treatment Interventions: Self-care/ADL training;Neuromuscular education;DME and/or AE instruction;Therapeutic activities;Patient/family education;Balance training   OT Goals(Current goals can be found in the care plan section) Acute Rehab OT Goals Patient Stated Goal: to go back to bed OT Goal Formulation: With patient Time For Goal Achievement: 01/18/13 Potential to Achieve Goals: Good  Visit Information  Last OT Received On: 01/04/13 Assistance Needed: +1 Reason Eval/Treat Not Completed: Patient declined, no reason specified (reports fatigue) History of Present Illness: 52 yo female admitted with sleep seizures and feels paralyzed and is unable to wake up.        Prior [place] expects to be discharged to:: Private residence Living Arrangements: Children Type of Home: Apartment Home Access: Level entry Home Layout: One Baxter: Environmental consultant - 2 wheels;Cane - single point Additional Comments: pt with decr arousal limiting ability to obtain complete hx. Pt answering single word responses and return to decr arousal Prior Function Level of Independence: Independent with assistive device(s) Communication Communication: Other (comment) (slurred, difficult to understand at times) Dominant Hand: Right         Vision/Perception Vision - History Baseline Vision: Wears glasses all the time   Cognition  Cognition Arousal/Alertness: Awake/alert Behavior During Therapy: WFL for tasks assessed/performed Overall Cognitive Status: Within Functional Limits for tasks assessed    Extremity/Trunk Assessment Upper Extremity Assessment Upper Extremity Assessment: Overall WFL for tasks assessed Lower Extremity Assessment Lower Extremity Assessment: Defer to PT evaluation     Mobility Bed Mobility Bed Mobility: Left Sidelying to Sit;Rolling  Left;Supine to Sit Rolling Left: 5: Supervision;With rail Left Sidelying to Sit:  5: Supervision;HOB elevated;With rails Supine to Sit: 5: Supervision;HOB elevated;With rails Details for Bed Mobility Assistance: needed x3 prompts to complete transfer Transfers Transfers: Sit to Stand;Stand to Sit Sit to Stand: 5: Supervision;With upper extremity assist;From chair/3-in-1 Stand to Sit: 5: Supervision;With upper extremity assist;To chair/3-in-1     Exercise     Balance     End of Session OT - End of Session Activity Tolerance: Patient tolerated treatment well Patient left: in chair;with call bell/phone within reach Nurse Communication: Mobility status;Precautions  GO     Peri [name] 01/04/2013, 2:27 PM Pager: [phone]

## 2013-01-04 NOTE — ED Provider Notes (Signed)
CSN: [id number]     Arrival date & time 01/03/13  1915 History   First MD Initiated Contact with Patient 01/03/13 2258     Chief Complaint  Patient presents with  . Shortness of Breath   (Consider location/radiation/quality/duration/timing/severity/associated sxs/prior Treatment) HPI  This is a 52 year old female, last medical history of diabetes, COPD, sleep apnea, previous MI, hypertension, manic-depression (per hx list) and seizure who presents the emergency department for cc of worsening seizure. She has a distant  History of seizures and CT is to be on gabapentin which controlled him at that time.  Patient states that her seizures began again 3 months ago.  She states she has seen her PCP and was begun on gabapentin however they have been worsening.  She states that her seizures occur when she is sleeping.  Her grandson who is in the room states that they are head to toe seizures.  He states that she does not seem to be confused after.  The patient states that one month ago she had a seizure which left her paralyzed on the left side for 3 days.  She called her primary care here were is for her to the ED however she did not come.  She states that she has had some resolution of this but has residual weakness and is having to use a walker to to her balance problems.  Patient states she "keeps a headache."  States that she has chronic shortness of breath.  Patient states that her sleep apnea she machine is not working well and she feels that her seizures are secondary to poor oxygenation at night. Past Medical History  Diagnosis Date  . High blood pressure   . Incontinence   . Anxiety   . Manic depression   . Heart attack   . Diabetes mellitus     class 2  . Asthma   . Emphysema   . High cholesterol   . Stroke   . Sinus trouble   . Seasonal allergies   . Sleep apnea   . Disorder of nervous system   . Myocardial infarct   . Complication of anesthesia     decreased bp, decreased heart  rate   Past Surgical History  Procedure Laterality Date  . Abdominal hysterectomy    . Cesarean section    . Hiatal hernia repair     Family History  Problem Relation Age of Onset  . Cancer Father     prostate  . Heart failure      cousin  . Cancer Mother     lung   History  Substance Use Topics  . Smoking status: Former Smoker    Types: Cigarettes    Quit date: 04/23/1977  . Smokeless tobacco: Never Used  . Alcohol Use: No   OB History   Grav Para Term Preterm Abortions TAB SAB Ect Mult Living                 Review of Systems  Constitutional: Negative for fever and chills.  HENT: Negative for trouble swallowing.   Respiratory: Positive for shortness of breath. Negative for chest tightness and wheezing.   Cardiovascular: Negative for chest pain.  Gastrointestinal: Negative for nausea, vomiting, abdominal pain, diarrhea and constipation.  Genitourinary: Negative for dysuria and hematuria.  Musculoskeletal: Negative for arthralgias and myalgias.  Skin: Negative for rash.  Neurological: Positive for seizures, weakness and headaches. Negative for syncope and numbness.  All other systems reviewed and are negative.  Allergies  Codeine; Hydroxyzine; and Sulfa antibiotics  Home Medications   Current Outpatient Rx  Name  Route  Sig  Dispense  Refill  . albuterol (PROVENTIL HFA;VENTOLIN HFA) 108 (90 BASE) MCG/ACT inhaler   Inhalation   Inhale 2 puffs into the lungs every 6 (six) hours as needed.   1 Inhaler   3   . albuterol (PROVENTIL) (2.5 MG/3ML) 0.083% nebulizer solution   Nebulization   Take 3 mLs (2.5 mg total) by nebulization every 6 (six) hours as needed. As needed for shortness of breath.   75 mL   1   . ALPRAZolam (XANAX) 1 MG tablet   Oral   Take 1 tablet (1 mg total) by mouth at bedtime as needed for anxiety.   30 tablet   0   . atorvastatin (LIPITOR) 40 MG tablet   Oral   Take 1 tablet (40 mg total) by mouth daily.   90 tablet   2   .  baclofen (LIORESAL) 10 MG tablet   Oral   Take 1 tablet (10 mg total) by mouth 3 (three) times daily as needed.   60 each   0   . budesonide-formoterol (SYMBICORT) 160-4.5 MCG/ACT inhaler   Inhalation   Inhale 2 puffs into the lungs 2 (two) times daily.   1 Inhaler   6   . clonazePAM (KLONOPIN) 1 MG tablet   Oral   Take 1 mg by mouth 3 (three) times daily.         . cloNIDine (CATAPRES) 0.2 MG tablet   Oral   Take 1 tablet (0.2 mg total) by mouth 3 (three) times daily.   270 tablet   1   . fexofenadine (ALLEGRA) 60 MG tablet   Oral   Take 120 mg by mouth 2 (two) times daily.         . fluticasone (FLONASE) 50 MCG/ACT nasal spray   Nasal   Place 2 sprays into the nose daily.   16 g   1   . furosemide (LASIX) 40 MG tablet   Oral   Take 1 tablet (40 mg total) by mouth daily.   90 tablet   1   . gabapentin (NEURONTIN) 300 MG capsule   Oral   Take 1 capsule (300 mg total) by mouth 3 (three) times daily.   270 capsule   6   . metFORMIN (GLUMETZA) 1000 MG (MOD) 24 hr tablet   Oral   Take 1 tablet (1,000 mg total) by mouth daily with breakfast.   30 tablet   4   . montelukast (SINGULAIR) 10 MG tablet   Oral   Take 1 tablet (10 mg total) by mouth at bedtime.   90 tablet   2   . naproxen (NAPROSYN) 500 MG tablet   Oral   Take 1 tablet (500 mg total) by mouth 2 (two) times daily with a meal.   60 tablet   1   . omeprazole (PRILOSEC) 40 MG capsule   Oral   Take 1 capsule (40 mg total) by mouth daily.   30 capsule   6   . oxybutynin (DITROPAN) 5 MG tablet   Oral   Take 1 tablet (5 mg total) by mouth 3 (three) times daily.   270 tablet   2   . potassium chloride (K-DUR) 10 MEQ tablet   Oral   Take 2 tablets (20 mEq total) by mouth daily.   60 tablet   2   .  traMADol (ULTRAM) 50 MG tablet   Oral   Take 1 tablet (50 mg total) by mouth every 8 (eight) hours as needed for pain.   90 tablet   2   . traZODone (DESYREL) 150 MG tablet   Oral    Take 150 mg by mouth 2 (two) times daily.         . verapamil (VERELAN PM) 360 MG 24 hr capsule   Oral   Take 1 capsule (360 mg total) by mouth at bedtime.   90 capsule   1   . ziprasidone (GEODON) 80 MG capsule   Oral   Take 1 capsule (80 mg total) by mouth 2 (two) times daily with a meal.   30 capsule   0    BP 144/86  Pulse 72  Temp(Src) 98.6 F (37 C) (Oral)  Resp 16  SpO2 96% Physical Exam  Constitutional: [name] oriented to person, place, and time. She appears well-developed and well-nourished. No distress.  HENT:  Head: Normocephalic and atraumatic.  Eyes: Conjunctivae and EOM are normal. Pupils are equal, round, and reactive to light.  Neck: Normal range of motion. No JVD present.  Cardiovascular: Normal rate, regular rhythm, normal heart sounds and intact distal pulses.   Pulmonary/Chest: Effort normal and breath sounds normal.  Abdominal: Soft. There is no tenderness. There is no guarding.  Musculoskeletal:  Speech is clear and goal oriented, follows commands Major Cranial nerves without deficit ACCEPT FOR l SIDE FACIAL WEAKNESS WITH SMILE SLIGHTLY DECREASED STRENGTH IN L UPPER AND LOWER EXTREMITY Sensation normal to light and sharp touch     Neurological: [name] alert and oriented to person, place, and time.  Skin: Skin is warm.  Psychiatric: [name] slowed.    ED Course  Procedures (including critical care time) Labs Review Labs Reviewed  CBC - Abnormal; Notable for the following:    WBC 11.0 (*)    Hemoglobin 11.2 (*)    All other components within normal limits  COMPREHENSIVE METABOLIC PANEL  URINALYSIS, ROUTINE W REFLEX MICROSCOPIC  BLOOD GAS, ARTERIAL  POCT I-STAT TROPONIN I   Imaging Review Dg Chest 2 View  01/03/2013   CLINICAL DATA:  Chest pain, shortness of Breath  EXAM: CHEST - 2 VIEW  COMPARISON:  03/27/2012  FINDINGS: Mild cardiomegaly. Tortuous aorta. Lungs clear. No effusion. Regional bones unremarkable.  IMPRESSION: Stable  cardiomegaly.  No acute disease.   Electronically Signed   By: Arne Cleveland M.D.   On: 01/03/2013 20:32    EKG Interpretation   None       MDM  No diagnosis found. 12:15 AM BP 144/86  Pulse 72  Temp(Src) 98.6 F (37 C) (Oral)  Resp 16  SpO2 96% Patient with complaint of worsening seizures.  Off work shows leukocytosis.  Negative troponin, elevation in alkaline phosphatase, chest x-ray without acute abnormality.  The skin is complaining of worsening seizures however her medication list that should she is on Tramadol. CT head pending.  We'll get an arterial blood gas as her seizures may be secondary to chronic hypoxia appear  1:17 AM Patient given in handoff to [place].  Margarita Mail, PA-C 01/04/13 [phone]

## 2013-01-04 NOTE — Progress Notes (Signed)
Patient set-up with auto cpap, full face mask on room air. Patient will use intermittently during the day and continuous at night.

## 2013-01-04 NOTE — ED Notes (Signed)
Admitting physician at bedside- family medicine.

## 2013-01-04 NOTE — H&P (Signed)
[facility] Admission History and Physical Service Pager: [phone]  Patient name: Tiffany [name] Medical record number: [id number] Date of birth: 1960-02-02 Age: 52 y.o. Gender: female  Primary Care Provider: [name], MD Consultants: Neurology Code Status: Full (confirmed on admission)  Chief Complaint: Seizures, Shortness of Breath  Assessment and Plan: [name] is a 52 y.o. female presenting with recent worsening of chronic sleep-only seizure activity and shortness of breath. PMH is significant for acquired seizure d/o (unclear etiology), OSA (on CPAP), dCHF (EF 55-60%), T2DM (well-controlled), morbid obesity, HLD, HTN, Depression with Anxiety, Chronic Pain Syndrome, h/o Asthma, h/o HSV.  # Generalized Seizure Activity, chronic sleep-only w/o clear etiology Report of chronic seizure activity gradually worsening over past 10 years (pt attributes to following MI, CVA). Never seen by Neurologist, previously controlled on Gabapentin. Description of seizures inconsistent with typical features, generalized body shaking, only occur while asleep, with regular occurrence 1-2x nightly, no post-ictal state, able to be awakened out of seizure. Suspect component of nocturnal hypoxic convulsions d/t chronic OSA with poor response to recent CPAP. Recent increased seizure activity correlates with questionable function of CPAP. Currently VSS, no seizure activity in ED. - Admit to telemetry - seizure precautions - close monitoring for recurrent seizure activity - continue home medications, including Gabapentin and benzos - PT/OT eval and treat, for assessment of generalized deconditioning - plan to consult Neurology in AM regarding further work-up and recommendations  # OSA, on home CPAP Reported home CPAP machine (x 52 yrs old) questionable if still functioning. Increased episodes of apnoea while using. ABG obtained in ED with normal pH 7.38, high CO2 (51), low O2  (63), high bicarb (30) suggestive of compensated respiratory acidosis d/t chronic CO2 retention with hypoxia, likely evidence of chronic OSA - resume CPAP overnight - plan to arrange outpatient Sleep Study, and re-evaluation for new and optimal CPAP at home  # Shortness of Breath, in setting with h/o Chronic Diastolic CHF, chronic dyspnea vs possible mild acute exacerbation Last ECHO (04/2011, Mild LVH, EF 0000000, grade 1 diastolic dysfunction). Significant orthopnea at baseline. Recent worsened LE edema and SOB, suspect possible component of mild acute dCHF exacerbation, however clinically does not appear significantly fluid overloaded (no obvious JVD, no crackles on exam, CXR clear). - monitor volume status, cautious with fluid hydration - continue home Lasix 40mg  PO daily, consider increase dose PO vs IV if worsening clinical status or further evidence of volume overload - ordered ProBNP - 2D echo  # Chronic Pain Syndrome, neck spasms - Continue home Baclofen, Gabapentin - Discontinued home Tramadol, d/t concern of lowering seizure threshold - re-evaluate pain in AM, determine if need additional agent  # T2DM, well controlled Last HgbA1c 5.8 (09/30/12). Last CBG 95 - Insulin meal coverage 0-15u Novolog. No Basal or QHS coverage at this time. - CBG monitoring  # HTN Stable BP [id number] systolic, XX123456 diastolic - Continue home Clonidine, Verapamil  # HLD, with h/o MI, CVA - continue home Lipitor 40mg  daily - consider ASA daily (currently not on any anti-platelet agents)  # h/o Asthma - continue home Albuterol, Singulair  # H/o Depression, Anxiety - continue home Geodon, Trazodone, Xanax, Clonazepam  FEN/GI: carb modified diet, NS @ 75cc/hr Prophylaxis: SQ Heparin  Disposition: Admitted to [place], observation status, pending clinical course improvement and completion of further work-up with consultations, expect patient to be discharged within 1-2 days  History of Present  Illness:   Tiffany [name] is a 52 y.o. female presenting with  episode of seizure activity last night while asleep with noted recent progressively worsening seizure activity over the past several months, decided to come to ED for evaluation. Significant history of generalized seizure activity x 10 years (following CVA and MI), defines seizures as generalized shaking of whole body without any post-ictal confusion or drowsiness, occurs only while asleep, regular frequency about 2x nightly with unclear duration, often self-limiting or woken up by Grandson, previously controlled on Gabapentin, and has never seen a Neurologist. Related to her sleep, she also endorses chronic OSA, has CPAP at home (52 yrs old) but questions if it still works properly, as she has been having apnea episodes while using it. [name] incontinent of urine with the seizures.  Additionally, endorses shortness of breath, and feels like "increased fluid build-up in body". States she woke up from sleep choking last night and coughed up some "fluid". Admits to increased bilateral leg swelling. At baseline she has orthopnea requiring multiple pillows and support device for elevation. Also, noted a recent experience of chest pain described as her "chest was caving in" several nights ago without any associated symptoms or radiation, felt like this was not similar to previous CP or with prior MI (10 years ago). Currently denies any chest pain or discomfort.   Overall functional status remains poor, lives at home with [name] [facility]) who is primary caregiver and support. Patient attributes functional decline to residual weakness in b/l hips (following CVA). Normally ambulates with cane, however following a seizure she is requires a walker d/t increased weakness.  Review Of Systems: Per HPI with the following additions:  Admits to SoB, HA, neck pain, chronic constipation, increased LE edema Denies any fever, chills, recent illness, cough, CP,  visual disturbances, new weakness, abdominal pain,   Otherwise 12 point review of systems was performed and was unremarkable.  Patient Active Problem List   Diagnosis Date Noted  . Health care maintenance 11/25/2012  . Folliculitis 0000000  . History of kidney stones 06/18/2012  . Chronic pain syndrome 06/18/2012  . Muscle spasm 06/18/2012  . Elevated serum creatinine 04/28/2012  . Asthma with acute exacerbation 04/28/2012  . Allergic reaction 04/07/2012  . Skin irritation 04/07/2012  . Bipolar 1 disorder 12/08/2011  . Itching 09/06/2011  . HSV infection 08/30/2011  . Dyslipidemia 04/24/2011  . Anemia 04/24/2011  . Depression with anxiety 04/24/2011  . Diabetic neuropathy 04/24/2011  . OSA on CPAP 10/18/2010  . Asthma 10/18/2010  . Obesity 10/18/2010  . HTN (hypertension) 10/18/2010  . DM (diabetes mellitus) 10/18/2010   Past Medical History: Past Medical History  Diagnosis Date  . High blood pressure   . Incontinence   . Anxiety   . Manic depression   . Heart attack   . Diabetes mellitus     class 2  . Asthma   . Emphysema   . High cholesterol   . Stroke   . Sinus trouble   . Seasonal allergies   . Sleep apnea   . Disorder of nervous system   . Myocardial infarct   . Complication of anesthesia     decreased bp, decreased heart rate   Past Surgical History: Past Surgical History  Procedure Laterality Date  . Abdominal hysterectomy    . Cesarean section    . Hiatal hernia repair     Social History: History  Substance Use Topics  . Smoking status: Former Smoker    Types: Cigarettes    Quit date: 04/23/1977  . Smokeless tobacco: Never  Used  . Alcohol Use: No   Additional social history:   Lives at home with [name] [facility]) as primary caregiver. Denies current smoker or EtOH use. Admits to infrequent marijuana use (1-2x monthly)  Please also refer to relevant sections of EMR.  Family History: Family History  Problem Relation Age of Onset  .  Cancer Father     prostate  . Heart failure      cousin  . Cancer Mother     lung   Allergies and Medications: Allergies  Allergen Reactions  . Codeine Nausea And Vomiting  . Hydroxyzine   . Sulfa Antibiotics Itching   No current facility-administered medications on file prior to encounter.   Current Outpatient Prescriptions on File Prior to Encounter  Medication Sig Dispense Refill  . albuterol (PROVENTIL HFA;VENTOLIN HFA) 108 (90 BASE) MCG/ACT inhaler Inhale 2 puffs into the lungs every 6 (six) hours as needed.  1 Inhaler  3  . albuterol (PROVENTIL) (2.5 MG/3ML) 0.083% nebulizer solution Take 3 mLs (2.5 mg total) by nebulization every 6 (six) hours as needed. As needed for shortness of breath.  75 mL  1  . ALPRAZolam (XANAX) 1 MG tablet Take 1 tablet (1 mg total) by mouth at bedtime as needed for anxiety.  30 tablet  0  . atorvastatin (LIPITOR) 40 MG tablet Take 1 tablet (40 mg total) by mouth daily.  90 tablet  2  . baclofen (LIORESAL) 10 MG tablet Take 1 tablet (10 mg total) by mouth 3 (three) times daily as needed.  60 each  0  . budesonide-formoterol (SYMBICORT) 160-4.5 MCG/ACT inhaler Inhale 2 puffs into the lungs 2 (two) times daily.  1 Inhaler  6  . cloNIDine (CATAPRES) 0.2 MG tablet Take 1 tablet (0.2 mg total) by mouth 3 (three) times daily.  270 tablet  1  . fluticasone (FLONASE) 50 MCG/ACT nasal spray Place 2 sprays into the nose daily.  16 g  1  . furosemide (LASIX) 40 MG tablet Take 1 tablet (40 mg total) by mouth daily.  90 tablet  1  . gabapentin (NEURONTIN) 300 MG capsule Take 1 capsule (300 mg total) by mouth 3 (three) times daily.  270 capsule  6  . metFORMIN (GLUMETZA) 1000 MG (MOD) 24 hr tablet Take 1 tablet (1,000 mg total) by mouth daily with breakfast.  30 tablet  4  . montelukast (SINGULAIR) 10 MG tablet Take 1 tablet (10 mg total) by mouth at bedtime.  90 tablet  2  . naproxen (NAPROSYN) 500 MG tablet Take 1 tablet (500 mg total) by mouth 2 (two) times daily  with a meal.  60 tablet  1  . omeprazole (PRILOSEC) 40 MG capsule Take 1 capsule (40 mg total) by mouth daily.  30 capsule  6  . oxybutynin (DITROPAN) 5 MG tablet Take 1 tablet (5 mg total) by mouth 3 (three) times daily.  270 tablet  2  . potassium chloride (K-DUR) 10 MEQ tablet Take 2 tablets (20 mEq total) by mouth daily.  60 tablet  2  . traMADol (ULTRAM) 50 MG tablet Take 1 tablet (50 mg total) by mouth every 8 (eight) hours as needed for pain.  90 tablet  2  . verapamil (VERELAN PM) 360 MG 24 hr capsule Take 1 capsule (360 mg total) by mouth at bedtime.  90 capsule  1  . ziprasidone (GEODON) 80 MG capsule Take 1 capsule (80 mg total) by mouth 2 (two) times daily with a meal.  30  capsule  0  . [DISCONTINUED] oxybutynin (DITROPAN) 5 MG tablet Take 1 tablet (5 mg total) by mouth 3 (three) times daily.  30 tablet  6    Objective: BP 137/62  Pulse 64  Temp(Src) 98.6 F (37 C) (Oral)  Resp 16  SpO2 99% Exam: General: laying in bed propped up with head elevated, pleasant, NAD HEENT: NCAT, PERRL, EOMI, patent nares w/o congestion, pharynx clear, mild dry MM Neck: non-tender, thick neck w/o obvious JVD Cardiovascular: RRR, no murmurs heard, intact cap refill < 3 sec Respiratory: CTAB without any significant wheezing, crackles, or rhonchi. Good air movement bilaterally with some bibasilar diminished breath sounds. Mild increased work of breathing, without respiratory distress. Speaks in full sentences Abdomen: obese, soft, NTND, +active BS Extremities: moves all ext. B/l LE +generalized tenderness to palpation, +2 pitting edema up to shins, +2 peripheral pulses intact Skin: warm, dry, intact Neuro: AAOx3, focal deficits appreciated decreased sensation light touch R-side face, otherwise non-focal with generalized residual weakness 4/5 b/l ext strength, distal sensation to light touch intact. Gait not tested  Labs and Imaging: CBC BMET   Recent Labs Lab 01/03/13 2318  WBC 11.0*  HGB  11.2*  HCT 36.0  PLT 370    Recent Labs Lab 01/03/13 2318  NA 141  K 3.8  CL 102  CO2 30  BUN 13  CREATININE 0.91  GLUCOSE 95  CALCIUM 8.9     Troponin POC - 0.00  ABG    Component Value Date/Time   PHART 7.383 01/04/2013 0025   PCO2ART 51.1* 01/04/2013 0025   PO2ART 63.0* 01/04/2013 0025   HCO3 30.5* 01/04/2013 0025   TCO2 32 01/04/2013 0025   O2SAT 91.0 01/04/2013 0025   12/19 EKG NSR, HR 71, low-voltage QRS, no acute ST-T wave changes  12/19 Chest Xray 2v IMPRESSION:  Stable cardiomegaly. No acute disease.  12/20 Head CT w/o contrast IMPRESSION:  1. No evidence of acute intracranial disease.  2. Chronic small vessel ischemic white matter disease that is mild,  but age advanced.  Nobie Putnam, DO 01/04/2013, 3:17 AM PGY-1, [place] Intern pager: [phone], text pages welcome   Upper Level Addendum:  I have seen and evaluated this patient along with Dr. Parks Ranger and reviewed the above note, making necessary revisions in pink.   Chrisandra Netters, MD Family Medicine PGY-2

## 2013-01-04 NOTE — ED Provider Notes (Signed)
Patient care assumed from Tiffany [name], Tiffany [name] with CT imaging pending. Plan admit for further work up of questionable seizure activity x 3 months. She states she has seen her PCP and was begun on gabapentin however seizures have been worsening. Patient also with decrease in her ADLs as she usually ambulates with a cane and now can barely get around with a walker. Based on HPI and history of sleep apnea, question whether "seizure activity" may be the result of hypoxic convulsions; patient endorsing problems with her CPAP machine.   CT negative for acute intracranial disease. Will consult family medicine for admit. Patient hemodynamically stable.  Have spoken with Tiffany [name] of [facility] Medicine who will admit for further work up.  Tiffany [name], Tiffany [name] 01/04/13 [phone]

## 2013-01-04 NOTE — ED Notes (Signed)
Pt returned from CT °

## 2013-01-04 NOTE — Progress Notes (Signed)
OT Cancellation Note  Patient Details Name: Tiffany [name] MRN: [id number] DOB: [birth year]   Cancelled Treatment:    Reason Eval/Treat Not Completed: Patient declined, no reason specified (reports fatigue)  Pt currently declined OT/PT evaluation requesting to sleep. Pt educated that OT / PT will return today to attempt again.    Peri [name] Pager: [phone]  01/04/2013, 11:25 AM

## 2013-01-04 NOTE — H&P (Signed)
FMTS Attending Admit Note Patient seen and examined by me, discussed with resident team and I agree with Dr Lennie Odor admission note.  Patient reports no further seizure activity since admission ("can't sleep"), has been having 2 to 3 seizures per night, witnessed at home by her son.  Last evaluated by Neurology about 10 years ago when she lived in [place].   [name] also requesting a new CPAP machine for her OSA, she has an outpatient sleep study scheduled for later in January but says her machine is not working. This morning she is comfortable-appearing, in no acute distress.  Plan for establishment with Neurology regarding need for any further inpatient workup prior to discharge.  Last brain MRI in April 2013 for stroke rule-out, reported as having no acute abnormality.  Given the chronicity of the patient's complaints/history, likely can have the majority of her workup as an outpatient.  I explained to her that her polysomnography will need to be done as an outpatient.  Dalbert Mayotte, MD

## 2013-01-04 NOTE — Progress Notes (Signed)
  Echocardiogram 2D Echocardiogram has been performed.  Tiffany [name] 01/04/2013, 11:15 AM

## 2013-01-04 NOTE — ED Provider Notes (Signed)
Medical screening examination/treatment/procedure(s) were performed by non-physician practitioner and as supervising physician I was immediately available for consultation/collaboration.  EKG Interpretation    Date/Time:  Friday January 03 2013 19:31:51 EST Ventricular Rate:  71 PR Interval:  168 QRS Duration: 72 QT Interval:  422 QTC Calculation: 458 R Axis:   32 Text Interpretation:  Normal sinus rhythm Low voltage QRS Cannot rule out Anterior infarct , age undetermined Abnormal ECG Confirmed by Navreet Bolda  MD, Atharva Mirsky [id number]) on 01/04/2013 1:27:41 AM             Kalman Drape, MD 01/04/13 [phone]

## 2013-01-04 NOTE — Consult Note (Signed)
Reason for Consult:Nocturnal seizures Referring Physician: Dr Lindell Noe  CC: Seizures  HPI: Tiffany [name] is a 52 y.o. female who suffered a stroke approximately 14 years ago while living in [place], [place]. The patient states that she was "paralyzed" on one side however this improved. She does not remember which side was affected. Shortly after her stroke she began having "seizures" at night. She had a sleep study and an EEG. She was diagnosed with obstructive sleep apnea and subsequently placed on CPAP and Neurontin. She had done well since that time until approximately 6 months ago when her nocturnal seizure activity began to return. She has similar symptoms if she takes a nap during the day. It is gotten progressively worse over the past several months. She feels this is related to the fact that her CPAP machine is old and does not work properly.  She reports that when these episodes occur she feels paralyzed and is unable to wake up. Occasionally her son will notice that she is twitching all over and calls her name very loudly until she wakes up. She sometimes feels groggy after these episodes. Recently she had an episode followed by 3 days of left-sided paresis. She has not been able to rest adequately and feels tired most of the time. [name] followed by [facility] at [facility] and decided to come in for further evaluation. A neurology consult was requested for further evaluation. The above history was obtained from the patient although she frequently fell asleep while talking.    Past Medical History  Diagnosis Date  . High blood pressure   . Incontinence   . Anxiety   . Manic depression   . Heart attack   . Diabetes mellitus     class 2  . Asthma   . Emphysema   . High cholesterol   . Stroke   . Sinus trouble   . Seasonal allergies   . Sleep apnea   . Disorder of nervous system   . Myocardial infarct   . Complication of anesthesia     decreased bp, decreased heart rate     Past Surgical History  Procedure Laterality Date  . Abdominal hysterectomy    . Cesarean section    . Hiatal hernia repair      Family History  Problem Relation Age of Onset  . Cancer Father     prostate  . Heart failure      cousin  . Cancer Mother     lung   She reports 2 cousins with seizure disorders.  Social History:  reports that she quit smoking about 35 years ago. Her smoking use included Cigarettes. She smoked 0.00 packs per day. She has never used smokeless tobacco. She reports that she does not drink alcohol or use illicit drugs.  Allergies  Allergen Reactions  . Codeine Nausea And Vomiting  . Hydroxyzine   . Sulfa Antibiotics Itching    Medications:  Scheduled: . atorvastatin  40 mg Oral Daily  . budesonide-formoterol  2 puff Inhalation BID  . clonazePAM  1 mg Oral TID  . cloNIDine  0.2 mg Oral TID  . fluticasone  2 spray Each Nare Daily  . furosemide  40 mg Oral Daily  . gabapentin  300 mg Oral TID  . heparin  5,000 Units Subcutaneous Q8H  . insulin aspart  0-15 Units Subcutaneous TID WC  . loratadine  10 mg Oral Daily  . montelukast  10 mg Oral QHS  . oxybutynin  5 mg Oral TID  . pantoprazole  80 mg Oral Daily  . traZODone  150 mg Oral BID  . verapamil  360 mg Oral QHS  . ziprasidone  80 mg Oral BID WC    ROS: History obtained from the patient  General ROS: negative for - chills, fever, night sweats, weight gain or weight loss. Positive for fatigue, headaches, and dizziness. Psychological ROS: negative for - behavioral disorder, hallucinations, memory difficulties, mood swings or suicidal ideation Ophthalmic ROS: negative for - blurry vision, double vision, eye pain or loss of vision ENT ROS: negative for - epistaxis, nasal discharge, oral lesions, sore throat, tinnitus or vertigo Allergy and Immunology ROS: negative for - hives or itchy/watery eyes Hematological and Lymphatic ROS: negative for - bleeding problems, bruising or swollen lymph  nodes Endocrine ROS: negative for - galactorrhea, hair pattern changes, polydipsia/polyuria or temperature intolerance Respiratory ROS: negative for - cough, hemoptysis, shortness of breath or wheezing Cardiovascular ROS: negative for - chest pain, dyspnea on exertion, edema or irregular heartbeat. She reports being short of breath while lying flat. She has also had recent ankle edema. Gastrointestinal ROS: negative for - abdominal pain, diarrhea, hematemesis, nausea/vomiting or stool incontinence. Positive for chronic constipation. Genito-Urinary ROS: negative for - dysuria, hematuria, incontinence or urinary frequency/urgency Musculoskeletal ROS: negative for - joint swelling or muscular weakness Neurological ROS: as noted in HPI Dermatological ROS: negative for rash and skin lesion changes  Physical Examination: Blood pressure 157/92, pulse 76, temperature 97.8 F (36.6 C), temperature source Oral, resp. rate 20, SpO2 92.00%.  Neurologic Examination Mental Status: Lethargic but oriented, thought content appropriate.  Speech fluent without evidence of aphasia.  Able to follow 3 step commands without difficulty. Cranial Nerves: II: Discs flat bilaterally; Visual fields grossly normal, pupils equal, round, reactive to light and accommodation III,IV, VI: ptosis not present, extra-ocular motions intact bilaterally V,VII: smile symmetric, facial light touch sensation normal bilaterally VIII: hearing normal bilaterally IX,X: gag reflex present XI: bilateral shoulder shrug XII: midline tongue extension Motor: Right : Upper extremity   5/5    Left:     Upper extremity   5/5  Lower extremity   5/5     Lower extremity   5/5 Due to lethargy must be prompted extensively to give full strength Sensory: Pinprick and light touch intact throughout, bilaterally Deep Tendon Reflexes: 2+ in the upper extremities, trace at the knees and absent at the ankles.   Plantars: Right: downgoing   Left:  downgoing Cerebellar: normal finger-to-nose Gait: Able to walk with therapy using a walker.     Laboratory Studies:   Basic Metabolic Panel:  Recent Labs Lab 01/03/13 2318  NA 141  K 3.8  CL 102  CO2 30  GLUCOSE 95  BUN 13  CREATININE 0.91  CALCIUM 8.9    Liver Function Tests:  Recent Labs Lab 01/03/13 2318  AST 10  ALT 12  ALKPHOS 152*  BILITOT 0.1*  PROT 6.7  ALBUMIN 3.3*   No results found for this basename: LIPASE, AMYLASE,  in the last 168 hours No results found for this basename: AMMONIA,  in the last 168 hours  CBC:  Recent Labs Lab 01/03/13 2318  WBC 11.0*  HGB 11.2*  HCT 36.0  MCV 88.7  PLT 370    Cardiac Enzymes: No results found for this basename: CKTOTAL, CKMB, CKMBINDEX, TROPONINI,  in the last 168 hours  BNP: No components found with this basename: POCBNP,   CBG:  Recent Labs Lab  01/04/13 0646 01/04/13 1114  GLUCAP 82 96    Microbiology: Results for orders placed during the hospital encounter of 04/24/11  URINE CULTURE     Status: None   Collection Time    04/24/11  4:31 PM      Result Value Range Status   Specimen Description URINE, CLEAN CATCH   Final   Special Requests NONE   Final   Culture  Setup Time [id number]   Final   Colony Count >=100,000 COLONIES/ML   Final   Culture     Final   Value: Multiple bacterial morphotypes present, none predominant. Suggest appropriate recollection if clinically indicated.   Report Status 04/25/2011 FINAL   Final    Coagulation Studies: No results found for this basename: LABPROT, INR,  in the last 72 hours  Urinalysis: No results found for this basename: COLORURINE, APPERANCEUR, LABSPEC, PHURINE, GLUCOSEU, HGBUR, BILIRUBINUR, KETONESUR, PROTEINUR, UROBILINOGEN, NITRITE, LEUKOCYTESUR,  in the last 168 hours  Lipid Panel:  No results found for this basename: chol, trig, hdl, cholhdl, vldl, ldlcalc    HgbA1C:  Lab Results  Component Value Date   HGBA1C 5.8 09/30/2012     Urine Drug Screen:     Component Value Date/Time   LABOPIA NEG 06/17/2012 1713   COCAINSCRNUR NEG 06/17/2012 1713   LABBENZ NEG 06/17/2012 1713   AMPHETMU NEG 06/17/2012 1713    Alcohol Level: No results found for this basename: ETH,  in the last 168 hours  Other results:   EKG: Normal sinus rhythm rate 71 beats per minute  Imaging:  Dg Chest 2 View 01/03/2013    Stable cardiomegaly.  No acute disease.      Ct Head Wo Contrast 01/04/2013    1. No evidence of acute intracranial disease. 2. Chronic small vessel ischemic white matter disease that is mild, but age advanced.     Mikey Bussing PA-C [facility] Pager [phone] 01/04/2013, 1:10 PM  Patient seen and examined.  Clinical course and management discussed.  Necessary edits performed.  I agree with the above.  Assessment and plan of care developed and discussed below.    Assessment/Plan: 52 year old female presenting with nightly episodes that she describes as seizures.  Began many years ago and resolved when placed on CPAP and Neurontin started.  Her CPAP machine is no longer working and her symptoms have worsened.  [name] now having her episodes many times nightly.  She did have one episode of weakness on the left that persisted for a few days.  Head CT has been performed and has been reviewed.  No acute changes are noted.  Further work up recommended.    Recommendations: 1.  MRI of the brain without contrast 2.  Increase Neurontin to 400mg  TID 3.  Would address CPAP machine   Alexis Goodell, MD [facility] [phone]  01/04/2013  2:02 PM

## 2013-01-04 NOTE — Progress Notes (Signed)
Utilization Review completed.  

## 2013-01-04 NOTE — Evaluation (Signed)
Physical Therapy Evaluation Patient Details Name: Tiffany [name] MRN: [id number] DOB: 15-Mar-1960 Today's Date: 01/04/2013 Time: [id number] PT Time Calculation (min): 29 min  PT Assessment / Plan / Recommendation History of Present Illness  52 yo female admitted with sleep seizures and feels paralyzed and is unable to wake up.   Clinical Impression  Patient demonstrates deficits in functional mobility as indicated below. Pt will benefit from continued skilled Pt to address deficits and maximize safety and function. Will continue to see as indicated and progress as tolerated.     PT Assessment  Patient needs continued PT services    Follow Up Recommendations  No PT follow up;Supervision/Assistance - 24 hour          Equipment Recommendations  None recommended by PT       Frequency Min 3X/week    Precautions / Restrictions Precautions Precautions: Fall   Pertinent Vitals/Pain No pain reported      Mobility  Bed Mobility Bed Mobility: Left Sidelying to Sit;Rolling Left;Supine to Sit Rolling Left: 5: Supervision;With rail Left Sidelying to Sit: 5: Supervision;HOB elevated;With rails Supine to Sit: 5: Supervision;HOB elevated;With rails Details for Bed Mobility Assistance: needed x3 prompts to complete transfer Transfers Transfers: Sit to Stand;Stand to Sit Sit to Stand: 5: Supervision;With upper extremity assist;From chair/3-in-1 Stand to Sit: 5: Supervision;With upper extremity assist;To chair/3-in-1 Ambulation/Gait Ambulation/Gait Assistance: 5: Supervision;3: Mod assist Ambulation Distance (Feet): 85 Feet Assistive device: Straight cane Ambulation/Gait Assistance Details: supervision when awake, but falling alseep while ambulating required mod assist to prevent LOB, patient extremely lethargic and difficult to maintain arousal Gait Pattern: Step-through pattern Gait velocity: decreased General Gait Details: instability noted    Exercises     PT Diagnosis:  Difficulty walking;Abnormality of gait;Generalized weakness  PT Problem List: Decreased strength;Decreased activity tolerance;Decreased balance;Decreased mobility PT Treatment Interventions: DME instruction;Gait training;Stair training;Functional mobility training;Therapeutic activities;Therapeutic exercise;Balance training;Patient/family education     PT Goals(Current goals can be found in the care plan section) Acute Rehab PT Goals Patient Stated Goal: to go back to bed PT Goal Formulation: With patient Time For Goal Achievement: 01/18/13 Potential to Achieve Goals: Good  Visit Information  Last PT Received On: 01/04/13 Assistance Needed: +1 PT/OT/SLP Co-Evaluation/Treatment: Yes Reason for Co-Treatment: For patient/therapist safety PT goals addressed during session: Mobility/safety with mobility;Balance Reason Eval/Treat Not Completed: Patient declined, no reason specified History of Present Illness: 52 yo female admitted with sleep seizures and feels paralyzed and is unable to wake up.        Prior [place] expects to be discharged to:: Private residence Living Arrangements: Children Type of Home: Apartment Home Access: Level entry Home Layout: One [place]: Environmental consultant - 2 wheels;Cane - single point Additional Comments: pt with decr arousal limiting ability to obtain complete hx. Pt answering single word responses and return to decr arousal Prior Function Level of Independence: Independent with assistive device(s) Communication Communication: Other (comment) (slurred, difficult to understand at times) Dominant Hand: Right    Cognition  Cognition Arousal/Alertness: Awake/alert Behavior During Therapy: WFL for tasks assessed/performed Overall Cognitive Status: Within Functional Limits for tasks assessed    Extremity/Trunk Assessment Upper Extremity Assessment Upper Extremity Assessment: Overall WFL for tasks assessed Lower  Extremity Assessment Lower Extremity Assessment: Defer to PT evaluation   Balance  instability noted, pt falling asleep during activity  End of Session PT - End of Session Equipment Utilized During Treatment: Gait belt Activity Tolerance: Patient limited by fatigue;Patient limited by lethargy Patient left: in  chair;with call bell/phone within reach;with chair alarm set;with nursing/sitter in room;with family/visitor present Nurse Communication: Mobility status  GP     [name] 01/04/2013, 3:41 PM [name], [place] DPT  [phone]

## 2013-01-04 NOTE — ED Notes (Signed)
Pt asked if she could obtain a urine sample. Pt stated that she couldn't at the time. Will follow up shortly

## 2013-01-04 NOTE — Progress Notes (Signed)
Tyjuan Demetro, PT DPT  [phone]  

## 2013-01-04 NOTE — ED Provider Notes (Signed)
Medical screening examination/treatment/procedure(s) were performed by non-physician practitioner and as supervising physician I was immediately available for consultation/collaboration.  EKG Interpretation    Date/Time:  Friday January 03 2013 19:31:51 EST Ventricular Rate:  71 PR Interval:  168 QRS Duration: 72 QT Interval:  422 QTC Calculation: 458 R Axis:   32 Text Interpretation:  Normal sinus rhythm Low voltage QRS Cannot rule out Anterior infarct , age undetermined Abnormal ECG Confirmed by Krishay Faro  MD, Raelee Rossmann [id number]) on 01/04/2013 1:27:41 AM             Kalman Drape, MD 01/04/13 [phone]

## 2013-01-05 DIAGNOSIS — R569 Unspecified convulsions: Secondary | ICD-10-CM

## 2013-01-05 LAB — BASIC METABOLIC PANEL
BUN: 14 mg/dL (ref 6–23)
CO2: 29 mEq/L (ref 19–32)
Calcium: 9 mg/dL (ref 8.4–10.5)
Chloride: 103 mEq/L (ref 96–112)
Creatinine, Ser: 1 mg/dL (ref 0.50–1.10)
GFR calc Af Amer: 73 mL/min — ABNORMAL LOW (ref >90)
GFR calc non Af Amer: 63 mL/min — ABNORMAL LOW (ref >90)
Glucose, Bld: 122 mg/dL — ABNORMAL HIGH (ref 70–99)
Potassium: 3.7 mEq/L (ref 3.5–5.1)
Sodium: 141 mEq/L (ref 135–145)

## 2013-01-05 LAB — CBC
HCT: 35.4 % — ABNORMAL LOW (ref 36.0–46.0)
Hemoglobin: 11.4 g/dL — ABNORMAL LOW (ref 12.0–15.0)
MCH: 28.2 pg (ref 26.0–34.0)
MCHC: 32.2 g/dL (ref 30.0–36.0)
MCV: 87.6 fL (ref 78.0–100.0)
Platelets: 377 10*3/uL (ref 150–400)
RBC: 4.04 MIL/uL (ref 3.87–5.11)
RDW: 16 % — ABNORMAL HIGH (ref 11.5–15.5)
WBC: 8.6 10*3/uL (ref 4.0–10.5)

## 2013-01-05 LAB — GLUCOSE, CAPILLARY
Glucose-Capillary: 124 mg/dL — ABNORMAL HIGH (ref 70–99)
Glucose-Capillary: 120 mg/dL — ABNORMAL HIGH (ref 70–99)
Glucose-Capillary: 90 mg/dL (ref 70–99)

## 2013-01-05 NOTE — Progress Notes (Signed)
Family Medicine Teaching Service Daily Progress Note Intern Pager: [phone]  Patient name: Barbara Vea Medical record number: [id number] Date of birth: [birth year] Age: 52 y.o. Gender: female  Primary Care Provider: [name], MD Consultants: neuro Code Status: full  Pt Overview and Major Events to Date:   Assessment and Plan:  Roxi Ernster is a 52 y.o. female presenting with recent worsening of chronic sleep-only seizure activity and shortness of breath. PMH is significant for acquired seizure d/o (unclear etiology), OSA (on CPAP), dCHF (EF 55-60%), T2DM (well-controlled), morbid obesity, HLD, HTN, Depression with Anxiety, Chronic Pain Syndrome, h/o Asthma, h/o HSV.   # Generalized Seizure Activity, chronic sleep-only w/o clear etiology  Report of chronic seizure activity gradually worsening over past 10 years (pt attributes to following MI, CVA). Never seen by Neurologist, previously controlled on Gabapentin. Description of seizures inconsistent with typical features, generalized body shaking, only occur while asleep, with regular occurrence 1-2x nightly, no post-ictal state, able to be awakened out of seizure. Suspect component of nocturnal hypoxic convulsions d/t chronic OSA with poor response to recent CPAP. Recent increased seizure activity correlates with questionable function of CPAP.  - continue telemetry  - seizure precautions  - close monitoring for recurrent seizure activity  - continue home medications, including Gabapentin and benzos  - PT/OT eval and treat, 24 hr supervision - appreciate neuro recs - will f/u MRI brain  # Decreased mental acuity: patient appeared drowsy on exam today. [name] on a number of sedating medications at home, Geodon 80mg ; Trazodone 150mg  BID; BNZ; gabapentin. Note gabapentin increased this hospitalization.  -will hold trazodone at this time -consider repeat ABG if continues to be drowsy -will await MRI brain results  # OSA, on home CPAP   Reported home CPAP machine (x 53 yrs old) questionable if still functioning. Increased episodes of apnea while using. ABG obtained in ED with normal pH 7.38, high CO2 (51), low O2 (63), high bicarb (30) suggestive of compensated respiratory acidosis d/t chronic CO2 retention with hypoxia, likely evidence of chronic OSA  - resume CPAP overnight  - plan to arrange outpatient Sleep Study, and re-evaluation for new and optimal CPAP at home   # Shortness of Breath, in setting with h/o Chronic Diastolic CHF, chronic dyspnea vs possible mild acute exacerbation  Last ECHO (04/2011, Mild LVH, EF 0000000, grade 1 diastolic dysfunction). Significant orthopnea at baseline. Recent worsened LE edema and SOB, suspect possible component of mild acute dCHF exacerbation, however clinically does not appear significantly fluid overloaded (no obvious JVD, no crackles on exam, CXR clear).  - monitor volume status, cautious with fluid hydration  - continue home Lasix 40mg  PO daily, consider increase dose PO vs IV if worsening clinical status or further evidence of volume overload  - ProBNP normal - 2D echo normal EF with grade 1 diastolic dysfunction  # Chronic Pain Syndrome, neck spasms  - Continue home Baclofen, Gabapentin  - Discontinued home Tramadol, d/t concern of lowering seizure threshold   # T2DM, well controlled  Last HgbA1c 5.8 (09/30/12). Last CBG 95  - Insulin meal coverage 0-15u Novolog. No Basal or QHS coverage at this time.  - CBG monitoring   # HTN  Stable BP [id number] systolic, [id number] diastolic  - Continue home Clonidine, Verapamil   # HLD, with h/o MI, CVA  - continue home Lipitor 40mg  daily  - consider ASA daily (currently not on any anti-platelet agents)   # h/o Asthma  - continue home Albuterol, Singulair   #  H/o Depression, Anxiety  - continue home Geodon, Xanax, Clonazepam - will hold trazodone given decreased mental acuity this morning   FEN/GI: carb modified diet, NS @ 75cc/hr   Prophylaxis: SQ Heparin   Disposition: pending completion of MRI and resolution of drowsiness  Subjective: States she feels well. No more seizures. Feels tired and sleepy.  Objective: Temp:  [97.7 F (36.5 C)-98.4 F (36.9 C)] 97.9 F (36.6 C) (12/21 0930) Pulse Rate:  [66-79] 73 (12/21 0930) Resp:  [18-20] 18 (12/21 0930) BP: (111-161)/(76-109) 121/77 mmHg (12/21 0930) SpO2:  [93 %-97 %] 93 % (12/21 0930) Weight:  [256 lb 9.9 oz (116.4 kg)] 256 lb 9.9 oz (116.4 kg) (12/21 [id number]) Physical Exam: General: NAD, drowsy appearing, will wake up to prompting Cardiovascular: rrr, no murmurs appreciated Respiratory: CTAB Abdomen: s, NT, ND Extremities: no edema noted Neuro: drowsy appearing, will wake up to prompting and answer questions appropriately, CN intact with exception of visual fields and EOM as she would not cooperate with these exams, she would note that I was holding up two fingers though, 5/5 strength bilateral biceps, triceps, grip, quads, plantar and dorsiflexion, sensation to light touch intact throughout bilateral upper and lower extremities  Laboratory:  Recent Labs Lab 01/03/13 2318 01/05/13 1115  WBC 11.0* 8.6  HGB 11.2* 11.4*  HCT 36.0 35.4*  PLT 370 377    Recent Labs Lab 01/03/13 2318 01/05/13 1115  NA 141 141  K 3.8 3.7  CL 102 103  CO2 30 29  BUN 13 14  CREATININE 0.91 1.00  CALCIUM 8.9 9.0  PROT 6.7  --   BILITOT 0.1*  --   ALKPHOS 152*  --   ALT 12  --   AST 10  --   GLUCOSE 95 122*    .  Imaging/Diagnostic Tests: Dg Chest 2 View  01/03/2013   CLINICAL DATA:  Chest pain, shortness of Breath  EXAM: CHEST - 2 VIEW  COMPARISON:  03/27/2012  FINDINGS: Mild cardiomegaly. Tortuous aorta. Lungs clear. No effusion. Regional bones unremarkable.  IMPRESSION: Stable cardiomegaly.  No acute disease.   Electronically Signed   By: Arne Cleveland M.D.   On: 01/03/2013 20:32   Ct Head Wo Contrast  01/04/2013   CLINICAL DATA:  Severe headache  EXAM:  CT HEAD WITHOUT CONTRAST  TECHNIQUE: Contiguous axial images were obtained from the base of the skull through the vertex without intravenous contrast.  COMPARISON:  04/24/2011  FINDINGS: Skull and Sinuses:Chronic opacification of the left mastoid tip. No acute osseous findings.  Orbits: No acute abnormality.  Brain: No evidence of acute abnormality, such as acute infarction, hemorrhage, hydrocephalus, or mass lesion/mass effect. Stable pattern of chronic small vessel ischemic change, with mild low-attenuation around the lateral ventricles.  IMPRESSION: 1. No evidence of acute intracranial disease. 2. Chronic small vessel ischemic white matter disease that is mild, but age advanced.   Electronically Signed   By: Jorje Guild M.D.   On: 01/04/2013 01:54     Leone Haven, MD 01/05/2013, 2:31 PM PGY-2, [place] Intern pager: [phone], text pages welcome

## 2013-01-05 NOTE — Progress Notes (Signed)
Patient seen and examined by me, discussed with resident team and I agree with Dr Ellen Henri assessment and plan for today.  She reports no new seizure activity.  Somewhat somnolent on exam today; plan to hold Trazodone, await MRI results.  If continues drowsy, consider ABG to assess for possible hypercapnia as contributor in setting of OSA.  Dalbert Mayotte, MD

## 2013-01-05 NOTE — Progress Notes (Signed)
FMTS Attending Note Patient seen and examined by me; she is awake but sleepy this morning, has been out of chair already today. Mildly slurred speech.  It is noted that she receives a fair amount of sedating medications (Geodon 80mg ; Trazodone 150mg  BID; BNZ; gabapentin).  Would hold trazodone for now and reduce dose upon discharge.  Awaiting MRI brain, which has not been done yet.   Dalbert Mayotte, MD

## 2013-01-06 ENCOUNTER — Inpatient Hospital Stay (HOSPITAL_COMMUNITY): Payer: Medicare Other

## 2013-01-06 DIAGNOSIS — E1142 Type 2 diabetes mellitus with diabetic polyneuropathy: Secondary | ICD-10-CM

## 2013-01-06 DIAGNOSIS — E119 Type 2 diabetes mellitus without complications: Secondary | ICD-10-CM

## 2013-01-06 DIAGNOSIS — E1149 Type 2 diabetes mellitus with other diabetic neurological complication: Secondary | ICD-10-CM

## 2013-01-06 DIAGNOSIS — E785 Hyperlipidemia, unspecified: Secondary | ICD-10-CM

## 2013-01-06 DIAGNOSIS — E669 Obesity, unspecified: Secondary | ICD-10-CM

## 2013-01-06 DIAGNOSIS — F319 Bipolar disorder, unspecified: Secondary | ICD-10-CM

## 2013-01-06 DIAGNOSIS — R799 Abnormal finding of blood chemistry, unspecified: Secondary | ICD-10-CM

## 2013-01-06 LAB — CBC
HCT: 34.6 % — ABNORMAL LOW (ref 36.0–46.0)
Hemoglobin: 10.8 g/dL — ABNORMAL LOW (ref 12.0–15.0)
MCH: 28.1 pg (ref 26.0–34.0)
MCHC: 31.2 g/dL (ref 30.0–36.0)
MCV: 89.9 fL (ref 78.0–100.0)
Platelets: 354 10*3/uL (ref 150–400)
RBC: 3.85 MIL/uL — ABNORMAL LOW (ref 3.87–5.11)
RDW: 16.1 % — ABNORMAL HIGH (ref 11.5–15.5)
WBC: 10.7 10*3/uL — ABNORMAL HIGH (ref 4.0–10.5)

## 2013-01-06 LAB — GLUCOSE, CAPILLARY
Glucose-Capillary: 125 mg/dL — ABNORMAL HIGH (ref 70–99)
Glucose-Capillary: 101 mg/dL — ABNORMAL HIGH (ref 70–99)
Glucose-Capillary: 116 mg/dL — ABNORMAL HIGH (ref 70–99)
Glucose-Capillary: 125 mg/dL — ABNORMAL HIGH (ref 70–99)

## 2013-01-06 LAB — BASIC METABOLIC PANEL
BUN: 15 mg/dL (ref 6–23)
CO2: 28 mEq/L (ref 19–32)
Calcium: 9.1 mg/dL (ref 8.4–10.5)
Chloride: 102 mEq/L (ref 96–112)
Creatinine, Ser: 0.96 mg/dL (ref 0.50–1.10)
GFR calc Af Amer: 77 mL/min — ABNORMAL LOW (ref >90)
GFR calc non Af Amer: 66 mL/min — ABNORMAL LOW (ref >90)
Glucose, Bld: 114 mg/dL — ABNORMAL HIGH (ref 70–99)
Potassium: 4 mEq/L (ref 3.5–5.1)
Sodium: 138 mEq/L (ref 135–145)

## 2013-01-06 MED ORDER — GABAPENTIN 400 MG PO CAPS: 400.0000 mg | ORAL_CAPSULE | Freq: Three times a day (TID) | ORAL | Status: DC

## 2013-01-06 MED ORDER — ALBUTEROL SULFATE (2.5 MG/3ML) 0.083% IN NEBU: 2.5000 mg | INHALATION_SOLUTION | Freq: Four times a day (QID) | RESPIRATORY_TRACT | Status: DC | PRN

## 2013-01-06 MED ORDER — TRAZODONE HCL 150 MG PO TABS: 150.0000 mg | ORAL_TABLET | Freq: Every day | ORAL | Status: DC

## 2013-01-06 MED ORDER — CLONAZEPAM 1 MG PO TABS: 1.0000 mg | ORAL_TABLET | Freq: Three times a day (TID) | ORAL | Status: DC

## 2013-01-06 MED ORDER — ALPRAZOLAM 1 MG PO TABS: 1.0000 mg | ORAL_TABLET | Freq: Every evening | ORAL | Status: DC | PRN

## 2013-01-06 NOTE — Progress Notes (Signed)
CM CONSULT Talked to patient about equipment needs; patient stated that she has a CPAP machine that she got from [employer] [facility] in [place] 89yrs ago that is no longer working properly. [facility]  Called [phone]) called and was informed that the DME company went out of business several years ago; Patient has home 02 through [employer]. Darian with [facility] called for a new CPAP and Nebulizer machine - patient will have to have a sleep study scheduled and orders for CPAP with settings and Nebulizer machine at discharge;  [facility] is faxing over paperwork to be completed by Attending MD before the test can be scheduled; Aneta Mins [phone]

## 2013-01-06 NOTE — Progress Notes (Signed)
FMTS Attending Note  I personally saw and evaluated the patient. The plan of care was discussed with the resident team. I agree with the assessment and plan as documented by the resident.   Monasia Lair MD 

## 2013-01-06 NOTE — Progress Notes (Signed)
Family Medicine Teaching Service Daily Progress Note Intern Pager: [phone]  Patient name: Tiffany [name] Medical record number: [id number] Date of birth: [birth year] Age: 52 y.o. Gender: female  Primary Care Provider: [name], MD Consultants: Neurology Code Status: Full (confirmed on admission)  Pt Overview and Major Events to Date:   Assessment and Plan: Tiffany [name] is a 52 y.o. female presenting with recent worsening of chronic sleep-only seizure activity and shortness of breath. PMH is significant for acquired seizure d/o (unclear etiology), OSA (on CPAP), dCHF (EF 55-60%), T2DM (well-controlled), morbid obesity, HLD, HTN, Depression with Anxiety, Chronic Pain Syndrome, h/o Asthma, h/o HSV.   # Convulsions vs Movement disorder, unlikely seizure activity, chronic sleep-only w/o clear etiology - Improved  Report of chronic seizure activity gradually worsening over past 10 years (pt attributes to following MI, CVA). Never seen by Neurologist, previously controlled on Gabapentin. Description of seizures inconsistent with typical features, generalized body shaking, only occur while asleep, with regular occurrence 1-2x nightly, no post-ictal state, able to be awakened out of seizure. Suspect component of nocturnal hypoxic convulsions d/t chronic OSA with poor response to recent CPAP. Recent increased seizure activity correlates with questionable function of CPAP.  - continue telemetry  - seizure precautions - PT/OT eval and treat, 24 hr supervision - close monitoring for recurrent seizure activity --> No seizure activity x 48 hrs  - significantly improved on CPAP overnight - c/s Neurology, greatly appreciate all recommendations    - MRI brain w/o remarkable findings for concern of focal disease, or etiology of seizure activity    - increased Neurontin to 400mg  TID (from home 300mg  TID), taking as anti-epileptic agent    - Overall, discussed case with Dr. Doy Mince, believe that seizure  activity is highly unlikely, and the movements experienced by patient during sleep are most likely secondary to severe OSA and possibly component of hypoxia during sleep.  # Somnolent with decreased mental acuity - Resolved patient appeared drowsy on exam 01/05/13. [name] on a number of sedating medications at home, Geodon 80mg ; Trazodone 150mg  BID; BNZ; gabapentin. Note gabapentin increased this hospitalization.  - continue to hold trazodone at this time, and on discharge  # OSA, on home CPAP  Reported home CPAP machine (x 53 yrs old) questionable if still functioning. Increased episodes of apnea while using. ABG obtained in ED with normal pH 7.38, high CO2 (51), low O2 (63), high bicarb (30) suggestive of compensated respiratory acidosis d/t chronic CO2 retention with hypoxia, likely evidence of chronic OSA  - CPAP (autotitration, 20/4, full face mask), tolerated very well, use as follows: (intermittently during day, and continuous at night) - pt already has outpatient Sleep Study scheduled (02/07/13) - CM assisted in arranging home CPAP from [facility]   # Shortness of Breath, in setting with h/o Chronic Diastolic CHF, chronic dyspnea vs possible mild acute exacerbation  Last ECHO (04/2011, Mild LVH, EF 0000000, grade 1 diastolic dysfunction). Significant orthopnea at baseline. Recent worsened LE edema and SOB, suspect possible component of mild acute dCHF exacerbation, however clinically does not appear significantly fluid overloaded (no obvious JVD, no crackles on exam, CXR clear).  - monitor volume status, cautious with fluid hydration  - continue home Lasix 40mg  PO daily, consider increase dose PO vs IV if worsening clinical status or further evidence of volume overload  - ProBNP normal - 2D echo normal EF with grade 1 diastolic dysfunction  # Chronic Pain Syndrome, neck spasms  - Continue home Baclofen, Gabapentin  - Discontinued  home Tramadol, d/t concern of lowering seizure threshold   # T2DM,  well controlled  Last HgbA1c 5.8 (09/30/12). Last CBG 95  - Insulin meal coverage 0-15u Novolog. No Basal or QHS coverage at this time.  - CBG monitoring   # HTN  Stable BP XX123456 systolic, XX123456 diastolic  - Continue home Clonidine, Verapamil   # HLD, with h/o MI, CVA  - continue home Lipitor 40mg  daily  - consider ASA daily (currently not on any anti-platelet agents)   # h/o Asthma  - continue home Albuterol, Singulair  - ordered new home nebulizer machine  # H/o Depression, Anxiety  - continue home Geodon, Xanax, Clonazepam - will hold trazodone given decreased mental acuity this morning   FEN/GI: carb modified diet, NS @ 75cc/hr  Prophylaxis: SQ Heparin   Disposition: significant improvement in alertness, resolved somnolence, improved on CPAP, no further seizures, neurology signed off, MRI stable, plan to discharge today  Subjective:  Overnight no acute events. Reports that she slept well on CPAP. No seizure activity. Feels good today. Ready to go home.  Objective: Temp:  [97.3 F (36.3 C)-98.2 F (36.8 C)] 97.3 F (36.3 C) (12/22 1337) Pulse Rate:  [56-78] 72 (12/22 1337) Resp:  [18-20] 18 (12/22 1337) BP: (102-129)/(60-80) 114/80 mmHg (12/22 1337) SpO2:  [93 %-98 %] 94 % (12/22 1337) Physical Exam: General: sitting up in bed eating ice cream, NAD Cardiovascular: RRR, no murmurs Respiratory: CTAB Abdomen: obese, soft, NTND, +active BS Extremities: no edema, non-tender Neuro: awake, alert, oriented, (significant improvement, no longer drowsy / sedated today). Muscle strength intact and distal sensation to light touch intact.  Laboratory:  Recent Labs Lab 01/03/13 2318 01/05/13 1115 01/06/13 0800  WBC 11.0* 8.6 10.7*  HGB 11.2* 11.4* 10.8*  HCT 36.0 35.4* 34.6*  PLT 370 377 354    Recent Labs Lab 01/03/13 2318 01/05/13 1115 01/06/13 0800  NA 141 141 138  K 3.8 3.7 4.0  CL 102 103 102  CO2 30 29 28   BUN 13 14 15   CREATININE 0.91 1.00 0.96   CALCIUM 8.9 9.0 9.1  PROT 6.7  --   --   BILITOT 0.1*  --   --   ALKPHOS 152*  --   --   ALT 12  --   --   AST 10  --   --   GLUCOSE 95 122* 114*   Troponin (negative)  ABG    Component Value Date/Time   PHART 7.383 01/04/2013 0025   PCO2ART 51.1* 01/04/2013 0025   PO2ART 63.0* 01/04/2013 0025   HCO3 30.5* 01/04/2013 0025   TCO2 32 01/04/2013 0025   O2SAT 91.0 01/04/2013 0025    Imaging/Diagnostic Tests:  12/19 EKG  NSR, HR 71, low-voltage QRS, no acute ST-T wave changes  12/19 Chest Xray 2v  IMPRESSION:  Stable cardiomegaly. No acute disease.  12/20 Head CT w/o contrast  IMPRESSION:  1. No evidence of acute intracranial disease.  2. Chronic small vessel ischemic white matter disease that is mild,  but age advanced.  12/22 MRI Brain w/o contrast IMPRESSION:  1. No acute intracranial abnormality identified.  2. Moderate for age nonspecific white matter signal changes, stable  or mildly increased since 2013.  3. Mild left mastoid effusion, significance doubtful.   Nobie Putnam, DO 01/06/2013, 3:24 PM PGY-1, [facility] Intern pager: [phone], text pages welcome

## 2013-01-06 NOTE — Progress Notes (Signed)
Tiffany [name] with [facility] called for CPAP and nebulizer machine - to be delivered to her home at discharge; patient will be staying at her mothers address  [address] [place] [address]  Apt for sleep study is February 07, 2013 at 8pm

## 2013-01-06 NOTE — Progress Notes (Signed)
Physical Therapy Treatment Patient Details Name: Tiffany [name] MRN: [id number] DOB: 03/29/1960 Today's Date: 01/06/2013 Time: [id number] PT Time Calculation (min): 17 min  PT Assessment / Plan / Recommendation  History of Present Illness 52 yo female admitted with sleep seizures and feels paralyzed and is unable to wake up.    PT Comments   Patient continues to remain limited significantly by lethargy and inability to ambulate safely. Patient is requiring mod to max assist to prevent complete fall. Patient unable to maintain wakefulness. Significantly high fall risk at this time. Will continue to see as indicated, concerned for safety at this time.   Follow Up Recommendations  No PT follow up;Supervision/Assistance - 24 hour (may need ST SNF if lethargy and ambulation does not improve)     Does the patient have the potential to tolerate intense rehabilitation     Barriers to Discharge        Equipment Recommendations  None recommended by PT (may need w/c if ambulation does not improve)    Recommendations for Other Services    Frequency Min 3X/week   Progress towards PT Goals Progress towards PT goals: Not progressing toward goals - comment  Plan Current plan remains appropriate;Other (comment) (pt may need ST SNF if lethargy and amb. does not improve)    Precautions / Restrictions Precautions Precautions: Fall   Pertinent Vitals/Pain No pain, SpO2 with activity 96% on rm air    Mobility  Bed Mobility Bed Mobility: Not assessed Transfers Transfers: Sit to Stand;Stand to Sit Sit to Stand: 4: Min assist Stand to Sit: 5: Supervision;With upper extremity assist;To chair/3-in-1 Details for Transfer Assistance: VCs for hand placement, some instability initially with standing Ambulation/Gait Ambulation/Gait Assistance: 3: Mod assist Ambulation Distance (Feet): 70 Feet Assistive device: Straight cane Ambulation/Gait Assistance Details: patient even more unsteady then prior  session, patient with difficulty maintaining arousal during ambulation, falling isideways and catching herself on the door as well as falling into therapist several times requiring max assist to prevent complete fall. Patient significantly unsteady and unable to maintain safe ambulation.  Gait Pattern: Step-through pattern Gait velocity: decreased General Gait Details: instability noted      PT Goals (current goals can now be found in the care plan section) Acute Rehab PT Goals Patient Stated Goal: to go back to bed PT Goal Formulation: With patient Time For Goal Achievement: 01/18/13 Potential to Achieve Goals: Good  Visit Information  Last PT Received On: 01/06/13 Assistance Needed: +1 History of Present Illness: 52 yo female admitted with sleep seizures and feels paralyzed and is unable to wake up.     Subjective Data  Patient Stated Goal: to go back to bed   Cognition  Cognition Arousal/Alertness: Lethargic Behavior During Therapy: WFL for tasks assessed/performed Overall Cognitive Status: Within Functional Limits for tasks assessed    Balance   Significant Fall Risk  End of Session PT - End of Session Equipment Utilized During Treatment: Gait belt Activity Tolerance: Patient limited by fatigue;Patient limited by lethargy Patient left: in chair;with call bell/phone within reach;with chair alarm set;with nursing/sitter in room;with family/visitor present Nurse Communication: Mobility status   GP     [name] 01/06/2013, 2:45 PM Tiffany [name], [place] DPT  [phone]

## 2013-01-07 ENCOUNTER — Telehealth: Payer: Self-pay | Admitting: Family Medicine

## 2013-01-07 NOTE — Discharge Summary (Signed)
[facility] Discharge Summary  Patient name: Tiffany [name] Medical record number: [id number] Date of birth: 1960/02/09 Age: 52 y.o. Gender: female Date of Admission: 01/03/2013  Date of Discharge: 01/06/13 Admitting Physician: Willeen Niece, MD  Primary Care Provider: [name], MD Consultants: Neurology  Indication for Hospitalization: Seizure-like Activity, Shortness of Breath  Discharge Diagnoses/Problem List:  Convulsions vs Movement disorder, unlikely seizure activity, chronic sleep-only w/o clear etiology - Resolved OSA, on CPAP - Improved Shortness of Breath, in setting of severe chronic OHS/OSA, Chronic Diastolic CHF - Resolved Somnolence with oversedation, likely secondary to medications - Resolved Chronic Pain Syndrome, neck spasms  T2DM, well controlled HTN HLD, with h/o MI, CVA H/o Asthma H/o Depression, Anxiety  Disposition: Home  Discharge Condition: Stable  Brief Hospital Course: Tiffany [name] is a 52 y.o. female presenting with recent worsening of chronic sleep-only seizure activity and shortness of breath. PMH is significant for acquired seizure d/o (unclear etiology), OSA (on CPAP), dCHF (EF 55-60%), T2DM (well-controlled), morbid obesity, HLD, HTN, Depression with Anxiety, Chronic Pain Syndrome, h/o Asthma, h/o HSV.  # Convulsions vs Movement disorder, unlikely seizure activity, chronic sleep-only w/o clear etiology - Resolved Report of chronic seizure activity gradually worsening over past 10 years (pt attributes to following MI, CVA). Never seen by Neurologist, previously controlled on Gabapentin. Description of seizures inconsistent with typical features, generalized body shaking, only occur while asleep, with regular occurrence 1-2x nightly, no post-ictal state, able to be awakened out of seizure. Suspect component of nocturnal hypoxic convulsions d/t chronic OSA with poor response to recent CPAP. Recent increased seizure  activity correlates with questionable function of old CPAP machine. During hospitalization, closely monitored for recurrent seizure activity and on telemetry, overall no seizure activity documented in hospital. Remained on CPAP each night with good results. Neurology was initially consulted, not convinced that described episodes were actually seizure activity, suspect primary etiology of OSA, MRI Brain performed (only finding of some unchanged non-specific white matter disease, not significant to cause potential seizures), increased Neurontin from 300mg  TID to 400mg  TID, but no other anti-epileptics indicated.  # OSA, on CPAP - Improved Reported home CPAP machine (x 53 yrs old) questionable if still functioning. Increased episodes of apnea while using. ABG obtained in ED with normal pH 7.38, high CO2 (51), low O2 (63), high bicarb (30) suggestive of compensated respiratory acidosis d/t chronic CO2 retention with hypoxia, likely evidence of chronic OSA. Initiated CPAP nightly in hospital, specifically: CPAP (autotitration, 5-20, full face mask), tolerated very well, use as follows: (intermittently during day, and continuous at night). Outpatient sleep study previously scheduled by PCP for (02/07/13 at 8:00pm). Arrangements made via [facility] to obtain new CPAP machine following discharge.   # Somnolence with oversedation, likely secondary to medications - Resolved On admission, no concerns of oversedation, however patient appeared drowsy on exam 01/05/13. [name] on a number of sedating medications at home, Geodon 80mg ; Trazodone 150mg  BID; BNZ; gabapentin. Note gabapentin increased this hospitalization. Goal to decrease sedating medications gradually. Held Trazodone in AM and Xanax. Plan on discharge to initiate Trazodone taper decrease to 150mg  nightly (hold AM) for 2 weeks, then DC completely. Given short supply of Xanax as patient currently out, but advised to wean off over next month. Re-visit other sedating  medications in future.  # Shortness of Breath, in setting of severe chronic OHS/OSA, Chronic Diastolic CHF - Resolved Last ECHO (04/2011, Mild LVH, EF 0000000, grade 1 diastolic dysfunction). Significant orthopnea at baseline. Recent worsened  LE edema and SOB, suspect possible component of mild acute dCHF exacerbation, however clinically does not appear significantly fluid overloaded (no obvious JVD, no crackles on exam, CXR clear, ProBNP norma). Monitored volume status, cautious with fluid hydration, resumed home Lasix 40mg  PO daily. Repeat ECHO demonstrates normal LV EF 123456, Grade 1 Diastolic Dysfunction.  # Chronic Pain Syndrome, neck spasms Discontinued home Tramadol (concern of lowering seizure threshold). Initially continued Baclofen and Gabapentin. However, discontinued Baclofen with concern for oversedation.  # T2DM, well controlled  Last HgbA1c 5.8 (09/30/12). Insulin meal coverage 0-15u Novolog. No Basal or QHS coverage at this time.  # HTN  Stable BP XX123456 systolic, XX123456 diastolic. Continued home Clonidine, Verapamil  # HLD, with h/o MI, CVA  Continued home Lipitor 40mg  daily. Recommend daily ASA.   # h/o Asthma  Continued home Albuterol, Singulair. Ordered new nebulizer machine on discharge via [facility].   # H/o Depression, Anxiety  Continued home Geodon, Xanax, Clonazepam. See above medication changes. Hold Trazodone in AM, hold Xanax   Issues for Follow Up:  1. CPAP for OSA - Ordered new home CPAP through [facility] to be delivered on discharge. Settings auto-titration, 5-20 pressure, full-face mask, recommended to wear nightly prior to sleep study (02/07/13), and then settings may change.  2. Unlikely Seizure-activity - Suspected 2/2 to OSA, however Neurology recommend increasing Gabapentin from 300mg  TID to 400mg  TID. Consider holding or decreasing Tramadol dosing, as it may lower seizure threshold.  3. Concern for Oversedation - Plan on discharge to initiate Trazodone taper  decrease to 150mg  nightly (hold AM) for 2 weeks, then DC completely. Given short supply of Xanax as patient currently out, but advised to wean off over next month. Re-visit other sedating medications in future.  Significant Procedures: none  Significant Labs and Imaging:   Recent Labs Lab 01/03/13 2318 01/05/13 1115 01/06/13 0800  WBC 11.0* 8.6 10.7*  HGB 11.2* 11.4* 10.8*  HCT 36.0 35.4* 34.6*  PLT 370 377 354    Recent Labs Lab 01/03/13 2318 01/05/13 1115 01/06/13 0800  NA 141 141 138  K 3.8 3.7 4.0  CL 102 103 102  CO2 30 29 28   GLUCOSE 95 122* 114*  BUN 13 14 15   CREATININE 0.91 1.00 0.96  CALCIUM 8.9 9.0 9.1  ALKPHOS 152*  --   --   AST 10  --   --   ALT 12  --   --   ALBUMIN 3.3*  --   --    Troponin (negative)  ABG    Component  Value  Date/Time    PHART  7.383  01/04/2013 0025    PCO2ART  51.1*  01/04/2013 0025    PO2ART  63.0*  01/04/2013 0025    HCO3  30.5*  01/04/2013 0025    TCO2  32  01/04/2013 0025    O2SAT  91.0  01/04/2013 0025    Imaging/Diagnostic Tests:  12/19 EKG  NSR, HR 71, low-voltage QRS, no acute ST-T wave changes  12/19 Chest Xray 2v  IMPRESSION:  Stable cardiomegaly. No acute disease.  12/20 Head CT w/o contrast  IMPRESSION:  1. No evidence of acute intracranial disease.  2. Chronic small vessel ischemic white matter disease that is mild,  but age advanced.  12/22 MRI Brain w/o contrast  IMPRESSION:  1. No acute intracranial abnormality identified.  2. Moderate for age nonspecific white matter signal changes, stable  or mildly increased since 2013.  3. Mild left mastoid effusion, significance doubtful.  12/20 ECHO - Study data: Technically adequate study. - Left ventricle: The cavity size was normal. Wall thickness was normal. Systolic function was normal. The estimated ejection fraction was in the range of 60% to 65%. Wall motion was normal; there were no regional wall motion abnormalities. Doppler parameters are  consistent with abnormal left ventricular relaxation (grade 1 diastolic dysfunction). - Aortic valve: Mildly calcified annulus. Trileaflet; normal thickness leaflets. Valve area: 2.67cm^2(VTI). Valve area: 2.84cm^2 (Vmax).  Results/Tests Pending at Time of Discharge: none  Discharge Medications:    Medication List    STOP taking these medications       baclofen 10 MG tablet  Commonly known as:  LIORESAL     traMADol 50 MG tablet  Commonly known as:  ULTRAM      TAKE these medications       albuterol 108 (90 BASE) MCG/ACT inhaler  Commonly known as:  PROVENTIL HFA;VENTOLIN HFA  Inhale 2 puffs into the lungs every 6 (six) hours as needed.     albuterol (2.5 MG/3ML) 0.083% nebulizer solution  Commonly known as:  PROVENTIL  Take 3 mLs (2.5 mg total) by nebulization every 6 (six) hours as needed. As needed for shortness of breath.     ALPRAZolam 1 MG tablet  Commonly known as:  XANAX  Take 1 tablet (1 mg total) by mouth at bedtime as needed for anxiety.     atorvastatin 40 MG tablet  Commonly known as:  LIPITOR  Take 1 tablet (40 mg total) by mouth daily.     budesonide-formoterol 160-4.5 MCG/ACT inhaler  Commonly known as:  SYMBICORT  Inhale 2 puffs into the lungs 2 (two) times daily.     clonazePAM 1 MG tablet  Commonly known as:  KLONOPIN  Take 1 tablet (1 mg total) by mouth 3 (three) times daily.     cloNIDine 0.2 MG tablet  Commonly known as:  CATAPRES  Take 1 tablet (0.2 mg total) by mouth 3 (three) times daily.     fexofenadine 60 MG tablet  Commonly known as:  ALLEGRA  Take 120 mg by mouth 2 (two) times daily.     fluticasone 50 MCG/ACT nasal spray  Commonly known as:  FLONASE  Place 2 sprays into the nose daily.     furosemide 40 MG tablet  Commonly known as:  LASIX  Take 1 tablet (40 mg total) by mouth daily.     gabapentin 400 MG capsule  Commonly known as:  NEURONTIN  Take 1 capsule (400 mg total) by mouth 3 (three) times daily.     metFORMIN  1000 MG (MOD) 24 hr tablet  Commonly known as:  GLUMETZA  Take 1 tablet (1,000 mg total) by mouth daily with breakfast.     montelukast 10 MG tablet  Commonly known as:  SINGULAIR  Take 1 tablet (10 mg total) by mouth at bedtime.     naproxen 500 MG tablet  Commonly known as:  NAPROSYN  Take 1 tablet (500 mg total) by mouth 2 (two) times daily with a meal.     omeprazole 40 MG capsule  Commonly known as:  PRILOSEC  Take 1 capsule (40 mg total) by mouth daily.     oxybutynin 5 MG tablet  Commonly known as:  DITROPAN  Take 1 tablet (5 mg total) by mouth 3 (three) times daily.     potassium chloride 10 MEQ tablet  Commonly known as:  K-DUR  Take 2 tablets (20 mEq total) by mouth daily.  traZODone 150 MG tablet  Commonly known as:  DESYREL  Take 1 tablet (150 mg total) by mouth at bedtime.     verapamil 360 MG 24 hr capsule  Commonly known as:  VERELAN PM  Take 1 capsule (360 mg total) by mouth at bedtime.     ziprasidone 80 MG capsule  Commonly known as:  GEODON  Take 1 capsule (80 mg total) by mouth 2 (two) times daily with a meal.        Discharge Instructions: Please refer to Patient Instructions section of EMR for full details.  Patient was counseled important signs and symptoms that should prompt return to medical care, changes in medications, dietary instructions, activity restrictions, and follow up appointments.   Follow-Up Appointments: Follow-up Information   Follow up with Lupita Dawn, MD On 01/08/2013. (at 1:30pm with Dr. Ree Kida)    Specialty:  [facility] Medicine   Contact information:   [place] [address] [phone]       Follow up with [facility] On 02/07/2013. (at 8:00pm - Sleep Study)    Specialty:  Sleep Medicine   Contact information:   [place], [place] [id number] [place] [address] [phone]      Kellye Mizner, DO 01/07/2013, 11:33 AM PGY-1, [place]

## 2013-01-07 NOTE — Telephone Encounter (Signed)
States that [facility] misplaced order for CPAP. Needs another one sent in to [facility]. Also, patient request to speak to Dr. Otis Dials if possible.

## 2013-01-07 NOTE — Telephone Encounter (Signed)
[facility] and [facility] are setting up a [facility] machine this afternoon.   [name], PGY-3 Family Medicine Resident

## 2013-01-07 NOTE — Telephone Encounter (Signed)
Called patient back and she staets that she was seen by [facility] in the hospital for a [facility] but that [facility] doesn't have the [facility] for it. She called them and they told her that she needed a new order for the CPAP.  She has a form that she signed for [facility] with following information:  CPAP order settings: 5/20  Will try to arrange CPAP with [facility].   Patient's new address:  [address] [place]  [name], PGY-3 Family Medicine Resident

## 2013-01-08 ENCOUNTER — Inpatient Hospital Stay: Payer: Medicare Other | Admitting: Family Medicine

## 2013-01-08 NOTE — Discharge Summary (Signed)
I agree with the discharge summary as documented.   Kyle Fletke MD  

## 2013-01-21 ENCOUNTER — Encounter: Payer: Self-pay | Admitting: Family Medicine

## 2013-01-21 ENCOUNTER — Ambulatory Visit (INDEPENDENT_AMBULATORY_CARE_PROVIDER_SITE_OTHER): Payer: Medicare HMO | Admitting: Family Medicine

## 2013-01-21 VITALS — BP 122/83 | HR 68 | Temp 97.9°F | Wt 268.0 lb

## 2013-01-21 DIAGNOSIS — M25569 Pain in unspecified knee: Secondary | ICD-10-CM

## 2013-01-21 DIAGNOSIS — Z9989 Dependence on other enabling machines and devices: Secondary | ICD-10-CM

## 2013-01-21 DIAGNOSIS — E669 Obesity, unspecified: Secondary | ICD-10-CM

## 2013-01-21 DIAGNOSIS — F341 Dysthymic disorder: Secondary | ICD-10-CM

## 2013-01-21 DIAGNOSIS — F418 Other specified anxiety disorders: Secondary | ICD-10-CM

## 2013-01-21 DIAGNOSIS — M25561 Pain in right knee: Secondary | ICD-10-CM

## 2013-01-21 DIAGNOSIS — G4733 Obstructive sleep apnea (adult) (pediatric): Secondary | ICD-10-CM

## 2013-01-21 DIAGNOSIS — M25562 Pain in left knee: Principal | ICD-10-CM

## 2013-01-21 MED ORDER — OMEPRAZOLE 40 MG PO CPDR: 40.0000 mg | DELAYED_RELEASE_CAPSULE | Freq: Every day | ORAL | Status: DC

## 2013-01-21 MED ORDER — BUDESONIDE-FORMOTEROL FUMARATE 160-4.5 MCG/ACT IN AERO: 2.0000 | INHALATION_SPRAY | Freq: Two times a day (BID) | RESPIRATORY_TRACT | Status: DC

## 2013-01-21 MED ORDER — GABAPENTIN (ONCE-DAILY) 600 MG PO TABS: 600.0000 mg | ORAL_TABLET | Freq: Three times a day (TID) | ORAL | Status: DC

## 2013-01-21 MED ORDER — TRAZODONE HCL 150 MG PO TABS: 150.0000 mg | ORAL_TABLET | Freq: Every day | ORAL | Status: DC

## 2013-01-21 MED ORDER — MONTELUKAST SODIUM 10 MG PO TABS: 10.0000 mg | ORAL_TABLET | Freq: Every day | ORAL | Status: DC

## 2013-01-21 MED ORDER — VERAPAMIL HCL ER 360 MG PO CP24: 360.0000 mg | ORAL_CAPSULE | Freq: Every day | ORAL | Status: DC

## 2013-01-21 MED ORDER — TRAMADOL HCL 50 MG PO TABS: 50100.0000 mg | ORAL_TABLET | Freq: Two times a day (BID) | ORAL | Status: DC | PRN

## 2013-01-21 MED ORDER — FUROSEMIDE 40 MG PO TABS: 40.0000 mg | ORAL_TABLET | Freq: Every day | ORAL | Status: DC

## 2013-01-21 MED ORDER — ATORVASTATIN CALCIUM 40 MG PO TABS: 40.0000 mg | ORAL_TABLET | Freq: Every day | ORAL | Status: DC

## 2013-01-21 MED ORDER — FEXOFENADINE HCL 60 MG PO TABS: 120.0000 mg | ORAL_TABLET | Freq: Two times a day (BID) | ORAL | Status: DC

## 2013-01-21 MED ORDER — FLUTICASONE PROPIONATE 50 MCG/ACT NA SUSP: 2.0000 | Freq: Every day | NASAL | Status: DC

## 2013-01-21 MED ORDER — CLONIDINE HCL 0.2 MG PO TABS: 0.2000 mg | ORAL_TABLET | Freq: Three times a day (TID) | ORAL | Status: DC

## 2013-01-21 MED ORDER — METFORMIN HCL ER (MOD) 1000 MG PO TB24: 1000.0000 mg | ORAL_TABLET | Freq: Every day | ORAL | Status: DC

## 2013-01-21 MED ORDER — POTASSIUM CHLORIDE ER 10 MEQ PO TBCR: 20.0000 meq | EXTENDED_RELEASE_TABLET | Freq: Every day | ORAL | Status: DC

## 2013-01-21 MED ORDER — ALBUTEROL SULFATE HFA 108 (90 BASE) MCG/ACT IN AERS: 2.0000 | INHALATION_SPRAY | Freq: Four times a day (QID) | RESPIRATORY_TRACT | Status: DC | PRN

## 2013-01-21 MED ORDER — OXYBUTYNIN CHLORIDE 5 MG PO TABS: 5.0000 mg | ORAL_TABLET | Freq: Three times a day (TID) | ORAL | Status: DC

## 2013-01-21 MED ORDER — ZIPRASIDONE HCL 80 MG PO CAPS: 80.0000 mg | ORAL_CAPSULE | Freq: Two times a day (BID) | ORAL | Status: DC

## 2013-01-21 NOTE — Patient Instructions (Signed)
Pain, left increase her gabapentin from 400 mg 3 times a day to 600 mg 3 times a day. You can also continue to take the tramadol no weight that if you start having seizure activity to stop it immediately. Please followup with me in one month.  I would also like for you to decrease the sodas you drink and substitute diet sodas instead. I recommend that you meet with our Registered Dietitian (Nutritionist), Dr. Juventino Slovak for help addressing your dietary and eating habits.  You can schedule an appointment with her by calling her directly at [phone].

## 2013-01-21 NOTE — Progress Notes (Signed)
Patient ID: Tiffany [name]    DOB: 1960-07-14, 53 y.o.   MRN: [id number] --- Subjective:  Tiffany [name] is a 53 y.o.female who presents for follow up on recent hospitalization for questionable seizures.  Her current concerns include the following: - pain in her knees: left more than right. Has been ongoing for a few weeks. She says that when she had a seizure in December, she had fallen and thinks that she has been hurting since then. She states that getting up from sitting to standing makes it worst. Standing for long periods of time makes it worst too. Stiffness is present - pain in lower back on the left side with shooting pain in the thigh. No weakness, no urinary or bowel incontinence.  - obesity: she was told in the hospital that her obesity could be contributing to her sleep apnea. She would like to know what she can do to be more active. She doesn't walk due to pain in her knees and back. She reports drinking more than 10 non diet sodas a day. She doesn't like water and doesn't like diet sodas either.  - sleep apnea: has had the CPAP at home and feels like this has helped. She has a sleep study on 02/07/13 for exact titration.  - seizure disorder: seen by neurology in hospital who wasn't convinced that this was true seizure activity. Gabapentin was increased from 300 to 400mg  three times a day which she has been taking. She denies any seizure activity.   ROS: see HPI Past Medical History: reviewed and updated medications and allergies. Social History: Tobacco: none  Objective: Filed Vitals:   01/21/13 1443  BP: 122/83  Pulse: 68  Temp: 97.9 F (36.6 C)    Physical Examination:   General appearance - alert, well appearing, and in no distress Neck - thick and obese, supple Chest - clear to auscultation, no wheezes, rales or rhonchi, symmetric air entry Heart - normal rate, regular rhythm, normal S1, S2, no murmurs Back - tenderness along left paralumbar muscles, no tenderness along cervical,  thoracic or lumbar spine Negative straight leg Poor effort due to pain with knee flexion, extension. 4+/5 strength with hip flexion bilaterally Knee: tenderness to palpation along lateral aspect of left knee. No obvious effusion or soft tissue swelling.normal range of motion bilaterally

## 2013-01-22 DIAGNOSIS — M25561 Pain in right knee: Secondary | ICD-10-CM | POA: Insufficient documentation

## 2013-01-22 DIAGNOSIS — M25562 Pain in left knee: Principal | ICD-10-CM | POA: Insufficient documentation

## 2013-01-22 MED ORDER — CYCLOBENZAPRINE HCL 10 MG PO TABS: 10.0000 mg | ORAL_TABLET | Freq: Three times a day (TID) | ORAL | Status: DC | PRN

## 2013-01-22 NOTE — Assessment & Plan Note (Signed)
Scheduled for sleep study on 02/07/13.  Current CPAP machine with hospital settings seems to help which is reassuring.  Also discussed weight loss. See obesity problem list.

## 2013-01-22 NOTE — Assessment & Plan Note (Signed)
Patient in pre-contemplative stage for weight loss. She would like something that could help her loose more effortlessly. Explained that I don't like prescribing diet pills.  She drinks a lot of sodas per day and I discussed with her the possibility of alternatives. She hesitantly agreed to switch to diet. I don't know if this will be very successful. Gave her number for Dr. Jenne Campus to call for nutrition consult.

## 2013-01-22 NOTE — Assessment & Plan Note (Signed)
Likely from osteoarthritis from obesity.  - obtain xray - follow up in 1 month for focused evaluation - increased gabapentin to 600mg  tid - continue tramadol. Concern for increased seizure threshold with tramadol, but since there was doubt from neurology that she actually had a seizure and since patient has been on tramadol for several months, this is less likely to have effect. Will nonetheless carefully monitor and patient is aware of this and wants to continue taking tramadol.

## 2013-01-22 NOTE — Assessment & Plan Note (Signed)
Seen at [facility] who prescribes her benzos and other psych meds.

## 2013-01-24 ENCOUNTER — Other Ambulatory Visit: Payer: Self-pay | Admitting: Family Medicine

## 2013-01-24 DIAGNOSIS — M549 Dorsalgia, unspecified: Secondary | ICD-10-CM

## 2013-01-24 MED ORDER — GABAPENTIN 600 MG PO TABS: 600.0000 mg | ORAL_TABLET | Freq: Three times a day (TID) | ORAL | Status: DC

## 2013-01-24 NOTE — Progress Notes (Signed)
Rectified gabapentin prescription for gabapentin 600mg  po tid, dispense 270 4 refills.  Faxed info and refilled med electronically  [name], PGY-3 Family Medicine Resident

## 2013-01-28 ENCOUNTER — Telehealth: Payer: Self-pay | Admitting: Family Medicine

## 2013-01-28 DIAGNOSIS — M549 Dorsalgia, unspecified: Secondary | ICD-10-CM

## 2013-01-28 MED ORDER — ALBUTEROL SULFATE HFA 108 (90 BASE) MCG/ACT IN AERS: 2.0000 | INHALATION_SPRAY | Freq: Four times a day (QID) | RESPIRATORY_TRACT | Status: DC | PRN

## 2013-01-28 MED ORDER — GABAPENTIN 600 MG PO TABS: 600.0000 mg | ORAL_TABLET | Freq: Three times a day (TID) | ORAL | Status: DC

## 2013-01-28 MED ORDER — BUDESONIDE-FORMOTEROL FUMARATE 160-4.5 MCG/ACT IN AERO: 2.0000 | INHALATION_SPRAY | Freq: Two times a day (BID) | RESPIRATORY_TRACT | Status: DC

## 2013-01-28 MED ORDER — CLONIDINE HCL 0.2 MG PO TABS: 0.2000 mg | ORAL_TABLET | Freq: Three times a day (TID) | ORAL | Status: DC

## 2013-01-28 MED ORDER — MONTELUKAST SODIUM 10 MG PO TABS: 10.0000 mg | ORAL_TABLET | Freq: Every day | ORAL | Status: DC

## 2013-01-28 MED ORDER — METFORMIN HCL ER (MOD) 1000 MG PO TB24: 1000.0000 mg | ORAL_TABLET | Freq: Every day | ORAL | Status: DC

## 2013-01-28 MED ORDER — FEXOFENADINE HCL 60 MG PO TABS: 120.0000 mg | ORAL_TABLET | Freq: Two times a day (BID) | ORAL | Status: DC

## 2013-01-28 MED ORDER — OXYBUTYNIN CHLORIDE 5 MG PO TABS: 5.0000 mg | ORAL_TABLET | Freq: Three times a day (TID) | ORAL | Status: DC

## 2013-01-28 MED ORDER — OMEPRAZOLE 40 MG PO CPDR: 40.0000 mg | DELAYED_RELEASE_CAPSULE | Freq: Every day | ORAL | Status: DC

## 2013-01-28 MED ORDER — NAPROXEN 500 MG PO TABS: 500.0000 mg | ORAL_TABLET | Freq: Two times a day (BID) | ORAL | Status: DC

## 2013-01-28 MED ORDER — FLUTICASONE PROPIONATE 50 MCG/ACT NA SUSP: 2.0000 | Freq: Every day | NASAL | Status: DC

## 2013-01-28 MED ORDER — CYCLOBENZAPRINE HCL 10 MG PO TABS: 10.0000 mg | ORAL_TABLET | Freq: Three times a day (TID) | ORAL | Status: DC | PRN

## 2013-01-28 MED ORDER — POTASSIUM CHLORIDE ER 10 MEQ PO TBCR: 20.0000 meq | EXTENDED_RELEASE_TABLET | Freq: Every day | ORAL | Status: DC

## 2013-01-28 MED ORDER — FUROSEMIDE 40 MG PO TABS: 40.0000 mg | ORAL_TABLET | Freq: Every day | ORAL | Status: DC

## 2013-01-28 MED ORDER — ATORVASTATIN CALCIUM 40 MG PO TABS: 40.0000 mg | ORAL_TABLET | Freq: Every day | ORAL | Status: DC

## 2013-01-28 MED ORDER — ALBUTEROL SULFATE (2.5 MG/3ML) 0.083% IN NEBU: 2.5000 mg | INHALATION_SOLUTION | Freq: Four times a day (QID) | RESPIRATORY_TRACT | Status: DC | PRN

## 2013-01-28 MED ORDER — VERAPAMIL HCL ER 360 MG PO CP24: 360.0000 mg | ORAL_CAPSULE | Freq: Every day | ORAL | Status: DC

## 2013-01-28 NOTE — Telephone Encounter (Signed)
Called pt. Informed of Dr.Losq's message. She had a question about the tramadol. We got disconnected. Javier Glazier, Gerrit Heck

## 2013-01-28 NOTE — Telephone Encounter (Signed)
Pt called because with the new year she now has to get her prescriptions through mail order and she is using [facility] 1/800/379/7617. All her prescriptions to be free must have a 90 day refill on them. Also the last prescription she had for tramadol she wasn't able to get because she didn't have the money, so she would like a refill now since she can afford it. Tiffany [name]

## 2013-01-28 NOTE — Telephone Encounter (Signed)
Please let patient know that I refilled to [facility] all of her medications except the medications prescribed by her psychiatrist: xanax, clonazepam, trazadone and geodon.  I gave her a prescription for tramadol at her last visit: 01/21/13 and she can use this prescription to fill at the pharmacy.   [name], PGY-3 Family Medicine Resident

## 2013-01-28 NOTE — Telephone Encounter (Signed)
Will fwd to MD.  Keigan Girten L, CMA  

## 2013-02-03 ENCOUNTER — Telehealth: Payer: Self-pay | Admitting: Family Medicine

## 2013-02-03 NOTE — Telephone Encounter (Signed)
Will fwd to MD.  Estanislado Surgeon L, CMA  

## 2013-02-03 NOTE — Telephone Encounter (Signed)
Needs 90 day supply refill on tramadol.  On back of prescription, needs name, date of birth and [employer] ID # and address. [employer] ID [id number] Please fax to [facility] Fax: [phone]

## 2013-02-04 MED ORDER — TRAMADOL HCL 50 MG PO TABS: 50.0000 mg | ORAL_TABLET | Freq: Two times a day (BID) | ORAL | Status: DC | PRN

## 2013-02-04 NOTE — Telephone Encounter (Signed)
Faxed Rx for tramadol 50-100mg  bid/prn pain for 270 tablets for 3 month supply. Wrote on back of prescription name of patient, DOB, [employer] ID, address  Please let patient know that it was faxed and that if there are problems with this prescription, for [facility] to contact us directly. Thank you!  [name], PGY-3 Family Medicine Resident

## 2013-02-05 NOTE — Telephone Encounter (Signed)
Pt notified.  Darnel Mchan L, CMA  

## 2013-02-06 ENCOUNTER — Telehealth: Payer: Self-pay | Admitting: *Deleted

## 2013-02-06 DIAGNOSIS — G4730 Sleep apnea, unspecified: Secondary | ICD-10-CM

## 2013-02-06 NOTE — Telephone Encounter (Signed)
[facility] the [facility] for patient's sleep study.  According to MD note, patient needs a split sleep study.  That is not what the order states.  I will fwd to MD to please change order to SLE 1000 if split study is what is wanted.  Lexys Milliner, Loralyn Freshwater, [place]

## 2013-02-06 NOTE — Telephone Encounter (Signed)
I entered a new order for split sleep study at [facility]. Please let sleep center know and whether anything else needs to be changed. Thank you very much!  [name], PGY-3 Family Medicine Resident

## 2013-02-07 ENCOUNTER — Ambulatory Visit (HOSPITAL_BASED_OUTPATIENT_CLINIC_OR_DEPARTMENT_OTHER): Payer: Medicare HMO

## 2013-02-07 ENCOUNTER — Ambulatory Visit (HOSPITAL_BASED_OUTPATIENT_CLINIC_OR_DEPARTMENT_OTHER): Payer: Medicare HMO | Attending: Family Medicine

## 2013-02-07 VITALS — Ht 63.0 in | Wt 268.0 lb

## 2013-02-07 DIAGNOSIS — Z6841 Body Mass Index (BMI) 40.0 and over, adult: Secondary | ICD-10-CM | POA: Insufficient documentation

## 2013-02-07 DIAGNOSIS — G4700 Insomnia, unspecified: Secondary | ICD-10-CM | POA: Insufficient documentation

## 2013-02-07 DIAGNOSIS — G4730 Sleep apnea, unspecified: Secondary | ICD-10-CM

## 2013-02-10 ENCOUNTER — Telehealth: Payer: Self-pay | Admitting: Family Medicine

## 2013-02-10 ENCOUNTER — Telehealth: Payer: Self-pay | Admitting: *Deleted

## 2013-02-10 NOTE — Telephone Encounter (Signed)
Called Sherea back at [place] to let her know of Ms. Osberg's diagnoses of osteoarthritis, diabetic neuropathy, seizure disorder and chronic pain. Ms. Wirz had told [employer] that she has rheumatoid arthritis which I do not have a confirmed diagnosis of. I told Sherea that Ms. Dewilde was possibly told years ago that she had this diagnosis but that it did not translate into our records.  I did confirm that she is at risk of fall and had multiple fall episodes in December 2014 possibly secondary to seizure disorder.   [name], PGY-3 Family Medicine Resident

## 2013-02-10 NOTE — Telephone Encounter (Signed)
Inez Catalina with [facility] returning a call to [place] regarding patient. Treating physicians name was left off referral and she needs this information to process referral. She can be reached at number provided, ext. [phone]. Will forward to [place]

## 2013-02-10 NOTE — Telephone Encounter (Signed)
Sherea from [facility] [employer] called and would like Dr. Otis Dials to verify the illness that Alyzea is stating she has to be able to set up [employer]. Please call her at [phone] and if not available please leave a message. [email or web address]

## 2013-02-10 NOTE — Telephone Encounter (Signed)
Will fwd. To Dr.Losq for review. Tiffany [name], Tiffany [name]

## 2013-02-12 ENCOUNTER — Telehealth: Payer: Self-pay | Admitting: Family Medicine

## 2013-02-12 NOTE — Telephone Encounter (Signed)
Spoke with patient and I told her, again, that her Tramadol rx was faxed to [facility].  Yony Roulston, Loralyn Freshwater, [place]

## 2013-02-12 NOTE — Telephone Encounter (Signed)
Patient would like to speak to a nurse about her Tramadol RX.

## 2013-02-15 DIAGNOSIS — G4730 Sleep apnea, unspecified: Secondary | ICD-10-CM

## 2013-02-15 NOTE — Sleep Study (Signed)
   NAME: Tiffany [name] DATE OF BIRTH:  [birth year] MEDICAL RECORD NUMBER [id number]  LOCATION: [facility]  PHYSICIAN: Lexus Barletta D  DATE OF STUDY: 02/07/2013  SLEEP STUDY TYPE: Nocturnal Polysomnogram               REFERRING PHYSICIAN: Dickie La, MD  INDICATION FOR STUDY: Insomnia with sleep apnea  EPWORTH SLEEPINESS SCORE:   12/24 HEIGHT: _0  (160 cm)  WEIGHT: 268 lb (121.564 kg)    Body mass index is 47.49 kg/(m^2).  NECK SIZE: 16.5 in.  MEDICATIONS: Charted for review  SLEEP ARCHITECTURE: Total sleep time 0 minutes. The patient did not sleep. No bedtime medication was taken. Trazodone was reported taken before arrival at the sleep Center.  RESPIRATORY DATA: No respiratory events met scoring criteria, AHI 0 per hour.  OXYGEN DATA: No snoring was recorded. Oxygen saturation while awake averaged 91.3% on room air. On arrival, while awake, on room air, oxygen saturation was 90% which is marginal and suggests the possibility of underlying cardiopulmonary disease.  CARDIAC DATA: Regular sinus rhythm  MOVEMENT/PARASOMNIA: No significant movement disturbance.  IMPRESSION/ RECOMMENDATION:   1) The patient did not sleep at all during this study time. She had offered a complaint of chronic insomnia. The possibility of sleep apnea or other sleep associated disorders cannot be addressed. If appropriate, this study could be repeated, perhaps with additional sleep medication.  Signed Baird Lyons M.D. Deneise Lever Diplomate, American Board of Sleep Medicine  ELECTRONICALLY SIGNED ON:  02/15/2013, 10:14 AM [place] PH: [phone]   FX: [phone] [place]

## 2013-03-27 ENCOUNTER — Telehealth: Payer: Self-pay | Admitting: Family Medicine

## 2013-03-27 MED ORDER — NAPROXEN 500 MG PO TABS: 500.0000 mg | ORAL_TABLET | Freq: Two times a day (BID) | ORAL | Status: DC

## 2013-03-27 NOTE — Telephone Encounter (Signed)
Refill request naproxen. Must be a 90 day supply in order to for patient to receive at no cost.

## 2013-03-27 NOTE — Telephone Encounter (Signed)
Sent Rx for naproxen bid 180 tablets for 90 day supply. [facility].   [name], PGY-3 Family Medicine Resident

## 2013-05-22 ENCOUNTER — Telehealth: Payer: Self-pay | Admitting: Family Medicine

## 2013-05-22 MED ORDER — NAPROXEN 500 MG PO TABS: 500.0000 mg | ORAL_TABLET | Freq: Two times a day (BID) | ORAL | Status: DC

## 2013-05-22 NOTE — Telephone Encounter (Signed)
Refilled naproxen  Tiffany [name], PGY-3 Family Medicine Resident

## 2013-05-22 NOTE — Telephone Encounter (Signed)
Refill request for naproxen.

## 2013-05-22 NOTE — Telephone Encounter (Signed)
Will fwd to PCP for review. Thanks. Tiffany [name]

## 2013-06-20 ENCOUNTER — Other Ambulatory Visit: Payer: Self-pay | Admitting: Family Medicine

## 2013-06-23 ENCOUNTER — Other Ambulatory Visit: Payer: Self-pay | Admitting: *Deleted

## 2013-06-25 ENCOUNTER — Telehealth: Payer: Self-pay | Admitting: Family Medicine

## 2013-06-25 MED ORDER — TRAMADOL HCL 50 MG PO TABS: 50.0000 mg | ORAL_TABLET | Freq: Two times a day (BID) | ORAL | Status: DC | PRN

## 2013-06-25 NOTE — Telephone Encounter (Signed)
Filled out fax request for tramadol for [facility] for tramadol 50mg  take 1-2 tabs po bid/prn pain. Number: [id number] for 3 month supply, no refills.   [name], PGY-3 Family Medicine Resident

## 2013-06-30 ENCOUNTER — Telehealth: Payer: Self-pay | Admitting: Family Medicine

## 2013-06-30 NOTE — Telephone Encounter (Signed)
Called and spoke with patient, she says she is having seizures again. She had one today and 3 on Saturday. She says they started after her psychiatrist started her on Buspar. She has stopped taking the medication for 4 days but is still having seizures. She wants to see Dr Otis Dials, I told her she was not available until 07/09/13. I offered her an appointment on the work in schedule but she wants to see Dr Otis Dials unless she feels she should be seen sooner than the 24th. Forward to Dr Otis Dials.[name], Kevin Fenton

## 2013-06-30 NOTE — Telephone Encounter (Signed)
Patient would like Dr. Otis Dials to know that she has began to have seizures again. She states that she began to have them after her psychiatrist began her on Busbar. She has had 5 within a week. Please advise.

## 2013-06-30 NOTE — Telephone Encounter (Signed)
If patient is having recurrent seizures, she should be seen sooner than the 24th. If she has recurrent seizures, she should be evaluated in the ED.   Please let patient know. Thank you!  [name], PGY-3 Family Medicine Resident

## 2013-06-30 NOTE — Telephone Encounter (Signed)
Called patient and told her that she needs to be evaluated at the ED, she agreed with this plan.Niel Peretti, Kevin Fenton

## 2013-07-07 ENCOUNTER — Ambulatory Visit: Payer: Medicare HMO | Admitting: Family Medicine

## 2013-07-09 ENCOUNTER — Ambulatory Visit: Payer: Medicare HMO | Admitting: Family Medicine

## 2013-07-15 ENCOUNTER — Ambulatory Visit (INDEPENDENT_AMBULATORY_CARE_PROVIDER_SITE_OTHER): Payer: Medicare HMO | Admitting: Family Medicine

## 2013-07-15 ENCOUNTER — Encounter: Payer: Self-pay | Admitting: Family Medicine

## 2013-07-15 VITALS — BP 156/105 | HR 58 | Temp 98.0°F | Resp 18 | Wt 264.0 lb

## 2013-07-15 DIAGNOSIS — R569 Unspecified convulsions: Secondary | ICD-10-CM

## 2013-07-15 DIAGNOSIS — E785 Hyperlipidemia, unspecified: Secondary | ICD-10-CM

## 2013-07-15 DIAGNOSIS — F341 Dysthymic disorder: Secondary | ICD-10-CM

## 2013-07-15 DIAGNOSIS — E1149 Type 2 diabetes mellitus with other diabetic neurological complication: Secondary | ICD-10-CM

## 2013-07-15 DIAGNOSIS — F418 Other specified anxiety disorders: Secondary | ICD-10-CM

## 2013-07-15 DIAGNOSIS — I10 Essential (primary) hypertension: Secondary | ICD-10-CM

## 2013-07-15 DIAGNOSIS — E1140 Type 2 diabetes mellitus with diabetic neuropathy, unspecified: Secondary | ICD-10-CM

## 2013-07-15 DIAGNOSIS — E1142 Type 2 diabetes mellitus with diabetic polyneuropathy: Secondary | ICD-10-CM

## 2013-07-15 MED ORDER — FLUTICASONE PROPIONATE 50 MCG/ACT NA SUSP: 2.0000 | Freq: Every day | NASAL | Status: DC

## 2013-07-15 MED ORDER — BUDESONIDE-FORMOTEROL FUMARATE 160-4.5 MCG/ACT IN AERO: 2.0000 | INHALATION_SPRAY | Freq: Two times a day (BID) | RESPIRATORY_TRACT | Status: DC

## 2013-07-15 MED ORDER — ALBUTEROL SULFATE HFA 108 (90 BASE) MCG/ACT IN AERS: 2.0000 | INHALATION_SPRAY | Freq: Four times a day (QID) | RESPIRATORY_TRACT | Status: DC | PRN

## 2013-07-15 MED ORDER — FUROSEMIDE 40 MG PO TABS: 40.0000 mg | ORAL_TABLET | Freq: Every day | ORAL | Status: DC

## 2013-07-15 MED ORDER — POTASSIUM CHLORIDE ER 10 MEQ PO TBCR: 20.0000 meq | EXTENDED_RELEASE_TABLET | Freq: Every day | ORAL | Status: DC

## 2013-07-15 MED ORDER — MONTELUKAST SODIUM 10 MG PO TABS: 10.0000 mg | ORAL_TABLET | Freq: Every day | ORAL | Status: DC

## 2013-07-15 MED ORDER — ALPRAZOLAM 1 MG PO TABS: 1.0000 mg | ORAL_TABLET | Freq: Every evening | ORAL | Status: DC | PRN

## 2013-07-15 MED ORDER — METFORMIN HCL ER (MOD) 1000 MG PO TB24: 1000.0000 mg | ORAL_TABLET | Freq: Every day | ORAL | Status: DC

## 2013-07-15 MED ORDER — GABAPENTIN 600 MG PO TABS: 600.0000 mg | ORAL_TABLET | Freq: Three times a day (TID) | ORAL | Status: DC

## 2013-07-15 MED ORDER — FEXOFENADINE HCL 60 MG PO TABS: 120.0000 mg | ORAL_TABLET | Freq: Two times a day (BID) | ORAL | Status: DC

## 2013-07-15 MED ORDER — VERAPAMIL HCL ER 360 MG PO CP24: 360.0000 mg | ORAL_CAPSULE | Freq: Every day | ORAL | Status: DC

## 2013-07-15 MED ORDER — OXYBUTYNIN CHLORIDE 5 MG PO TABS: 5.0000 mg | ORAL_TABLET | Freq: Three times a day (TID) | ORAL | Status: DC

## 2013-07-15 MED ORDER — TRAMADOL HCL 50 MG PO TABS: 50.0000 mg | ORAL_TABLET | Freq: Two times a day (BID) | ORAL | Status: DC | PRN

## 2013-07-15 MED ORDER — CYCLOBENZAPRINE HCL 10 MG PO TABS: 10.0000 mg | ORAL_TABLET | Freq: Three times a day (TID) | ORAL | Status: DC | PRN

## 2013-07-15 MED ORDER — CLONIDINE HCL 0.2 MG PO TABS: 0.4000 mg | ORAL_TABLET | Freq: Three times a day (TID) | ORAL | Status: DC

## 2013-07-15 MED ORDER — ALBUTEROL SULFATE (2.5 MG/3ML) 0.083% IN NEBU: INHALATION_SOLUTION | RESPIRATORY_TRACT | Status: DC

## 2013-07-15 MED ORDER — OMEPRAZOLE 40 MG PO CPDR: 40.0000 mg | DELAYED_RELEASE_CAPSULE | Freq: Every day | ORAL | Status: DC

## 2013-07-15 MED ORDER — ATORVASTATIN CALCIUM 40 MG PO TABS: 40.0000 mg | ORAL_TABLET | Freq: Every day | ORAL | Status: DC

## 2013-07-15 NOTE — Patient Instructions (Signed)
Thank you for coming in, today!  I will refill your medicines today. Make sure you follow up close with Dr. Ronnald Ramp about your anxiety. Talk to her about the problems you had with Buspar. I'll let her decide which medicines she wants to continue.  I will check your A1c and other labs today. I will call you or send you a letter with the results. I will give you information on getting a colonoscopy and a mammogram.  Plan to see me back in about 3 months or so. Please feel free to call with any questions or concerns at any time, at [phone]. --Dr. Venetia Maxon

## 2013-07-15 NOTE — Assessment & Plan Note (Signed)
A: Slightly unclear history, ?bipolar disorder with psychotic features or NOS mood disorder. Seen by Dr. Ronnald Ramp, who manages Geodon and trazodone. Pt has not seen her recently and feels Buspar was not tolerated well due to "seizures" (see separate problem list note). Per previous notes, it appears pt was on benzos previously managed at [facility] and has had short-term Rx's from earlier this year. Pt feels Xanax works well for her and takes it only at night, and has a current Rx, but only about 14 tablets and no remaining refills, with no appt to see Dr. Ronnald Ramp for another 2 months.  P: Feel comfortable giving Rx for Xanax once daily, #60, with no refills, with the expectation that pt will f/u with psychiatrist for further eval of treatment and decision on whether or not to continue Xanax or use other agent(s). Pt in agreement. F/u as needed, otherwise.

## 2013-07-15 NOTE — Assessment & Plan Note (Signed)
A: Pt is slightly poor / vague historian, but on chart review, pt has been on clonidine and apparently compliant with 0.2 mg TID. Also on Lasix 40 mg daily and verapamil 360 mg 24h capsule daily. Some headaches, recently, but no apparent other significant symptoms directly related to HTN.  P: Increase clonidine to 0.4 mg TID; use 0.2 mg tablets x2 TID until out, with new Rx sent in, today. Per review, previous plan was to consider starting ACE, which may be the next step. CMP drawn today to eval kidney function. Will f/u result and monitor BP closely at f/u.

## 2013-07-15 NOTE — Assessment & Plan Note (Signed)
A: Very unclear history and per record review, pt-reported hx of seizure does NOT sound like formal / classic diagnosis / presentation for seizure. See hospitalziation notes from Dec 2014; apparent pseudoseizure, anxiety-related symptoms, or movement disorder, controlled with gabapentin, and apparently exacerbated by Buspar (started by psychiatry recently, self-stopped, per pt).  P: Continue gabapentin 600 mg TID. Continue off Buspar and f/u with psychiatry. F/u for this as needed, though unsure exactly how best to manage this -- may need to discuss with neurology and / or formally refer, or pursue other avenues such as counseling / therapy / CBT, etc.

## 2013-07-15 NOTE — Progress Notes (Signed)
   Subjective:    Patient ID: Tiffany [name], female    DOB: 10/20/1960, 53 y.o.   MRN: [id number]  HPI: Pt presents to clinic to meet me as her new PCP and for medication refill.  Anxiety / Pt-reported hx of seizures - generally well-controlled with medications, but recently had increased "seizures" with Buspar - Dr. Ronnald Ramp refilled Xanax but started Buspar recently and pt states that Buspar triggered her seizures; pt stopped Buspar herself - pt has not seen Dr. Ronnald Ramp since that time and states she needs to see her - Dr. Ronnald Ramp manages pt's Geodon and trazodone which pt feels controls her symptoms well - pt does not see neurology for seizures; on chart review, appears possible movement disorder, pseudoseizure, or anxiety-related  - see hosp tal H&P, progress, and discharge notes from hospitalization December 2014  - neurologist consulted in the hospital; symptoms managed with gabapentin  - description of symptoms not entirely consistent with seizure (generalized body-shaking, only at night, no post-ictal period, etc)  HTN - pt reports compliance with clonidine 0.2 mg TID but states her BP has been running high for a few months - she does have occasional SOB and headaches; denies frank chest pain or LE edema   DM - pt has not had A1c checked in a few months - reports compliance with metformin and checks her sugar "occasionally" at home  Chronic issues:  - requests refills of medications for the above issues as well as asthma, GERD, and allergies - overdue for mammogram and colonoscopy  Pt is a current smoker. She smokes 1/2 pack per day. [name] interested in quitting and has not tried 1-800-QUIT-NOW. In addition to the above documentation, pt's PMH, surgical history, FH, and SH all reviewed and updated where appropriate in the EMR. I have also reviewed and updated the pt's allergies and current medications as appropriate.  Review of Systems: As above. Otherwise, full 12-system ROS was  reviewed and all negative.     Objective:   Physical Exam BP 156/105  Pulse 58  Temp(Src) 98 F (36.7 C) (Oral)  Resp 18  Wt 264 lb (119.75 kg)  SpO2 95% Gen: well-appearing adult female in NAD HEENT: [place]/AT, sclerae/conjunctivae clear, no lid lag, EOMI, PERRLA   MMM, posterior oropharynx clear, no cervical lymphadenopathy  neck supple with full ROM, no masses appreciated; thyroid not enlarged  Cardio: RRR, no murmur appreciated; distal pulses intact/symmetric Pulm: CTAB, no wheezes, normal WOB  Abd: soft, nondistended, BS+, no HSM; obese Ext: warm/well-perfused, no cyanosis/clubbing/edema MSK: strength 5/5 in all four extremities, no frank joint deformity/effusion  normal ROM to all four extremities with no point muscle/bony tenderness in spine Neuro/Psych: alert/oriented, sensation grossly intact; normal gait/balance  mood reported euthymic with congruent affect; some psychomotor slowing; no HI / SI     Assessment & Plan:  See problem list notes. In addition to those plans / refills, refilled numerous chronic medications. Also provided information on getting colonoscopy and mammogram.

## 2013-07-15 NOTE — Assessment & Plan Note (Signed)
Compliant with Lipitor and denies new side effects or complication. Indication appears to be diabetes and ?history of CVA, per old H&P / discharge notes. Continue Lipitor 40 mg daily. CMP and lipid panel drawn, today. F/u as needed.

## 2013-07-15 NOTE — Assessment & Plan Note (Signed)
A: Last A1c 5.8 9 months ago. Reports compliance with metformin and checks CBG's infrequently.  P: Error with lab collection today, so A1c not drawn. Plan to f/u in 3 months. Will check A1c at that time. F/u as needed, otherwise.

## 2013-07-16 ENCOUNTER — Encounter: Payer: Self-pay | Admitting: Family Medicine

## 2013-07-16 LAB — LIPID PANEL
Cholesterol: 112 mg/dL (ref 0–200)
HDL: 35 mg/dL — ABNORMAL LOW (ref >39)
LDL Cholesterol: 54 mg/dL (ref 0–99)
Total CHOL/HDL Ratio: 3.2 Ratio
Triglycerides: 115 mg/dL (ref <150)
VLDL: 23 mg/dL (ref 0–40)

## 2013-07-16 LAB — COMPREHENSIVE METABOLIC PANEL
ALT: 8 U/L (ref 0–35)
AST: 8 U/L (ref 0–37)
Albumin: 3.4 g/dL — ABNORMAL LOW (ref 3.5–5.2)
Alkaline Phosphatase: 122 U/L — ABNORMAL HIGH (ref 39–117)
BUN: 10 mg/dL (ref 6–23)
CO2: 29 mEq/L (ref 19–32)
Calcium: 9 mg/dL (ref 8.4–10.5)
Chloride: 108 mEq/L (ref 96–112)
Creat: 0.79 mg/dL (ref 0.50–1.10)
Glucose, Bld: 88 mg/dL (ref 70–99)
Potassium: 4.7 mEq/L (ref 3.5–5.3)
Sodium: 142 mEq/L (ref 135–145)
Total Bilirubin: 0.2 mg/dL (ref 0.2–1.2)
Total Protein: 6 g/dL (ref 6.0–8.3)

## 2013-09-15 IMAGING — CR DG CHEST 2V
2 series · 2 of 2 positions shown · non-contrast
Comparison: 12/21/2005

CLINICAL DATA: Productive cough, shortness of breath.

CHEST - 2 VIEW

[w chest pa]
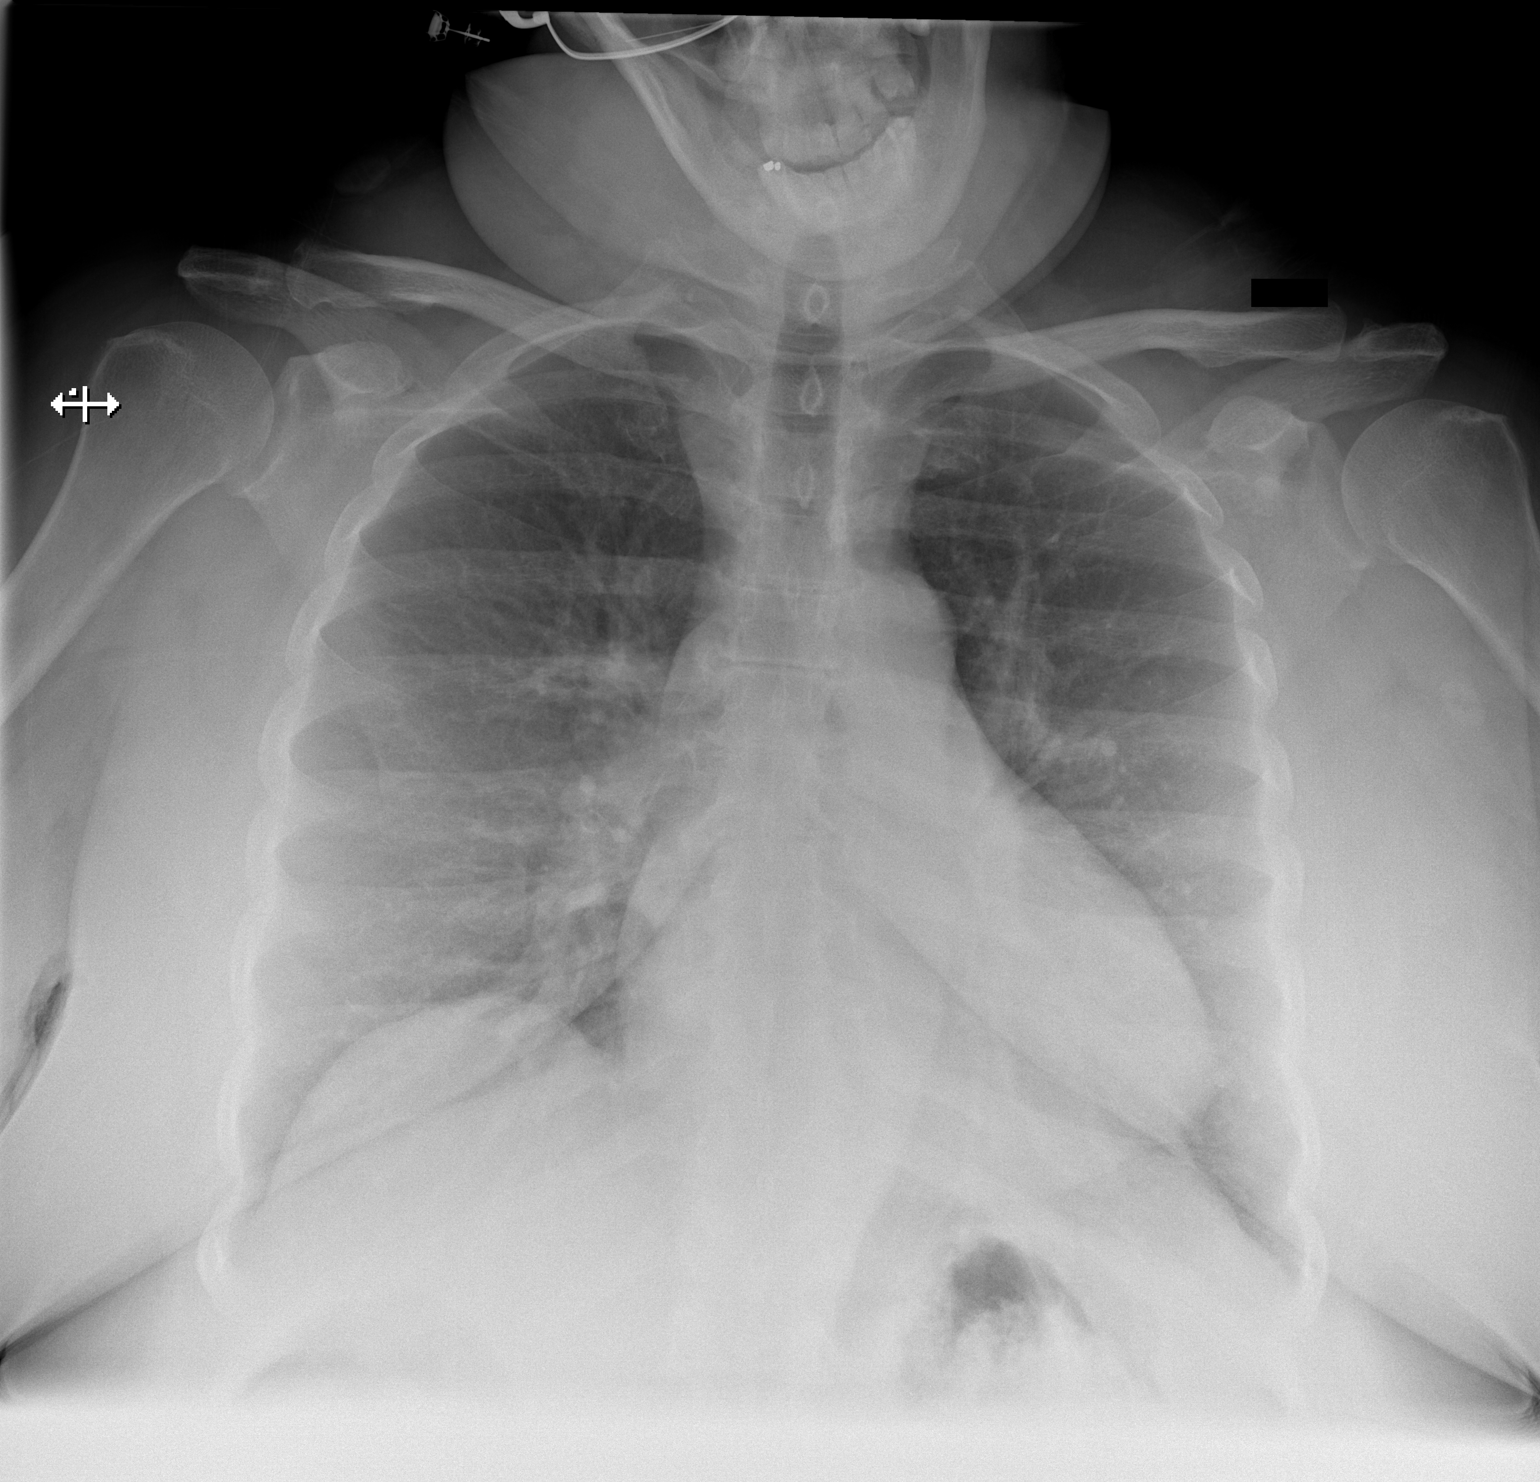

[w chest lat]
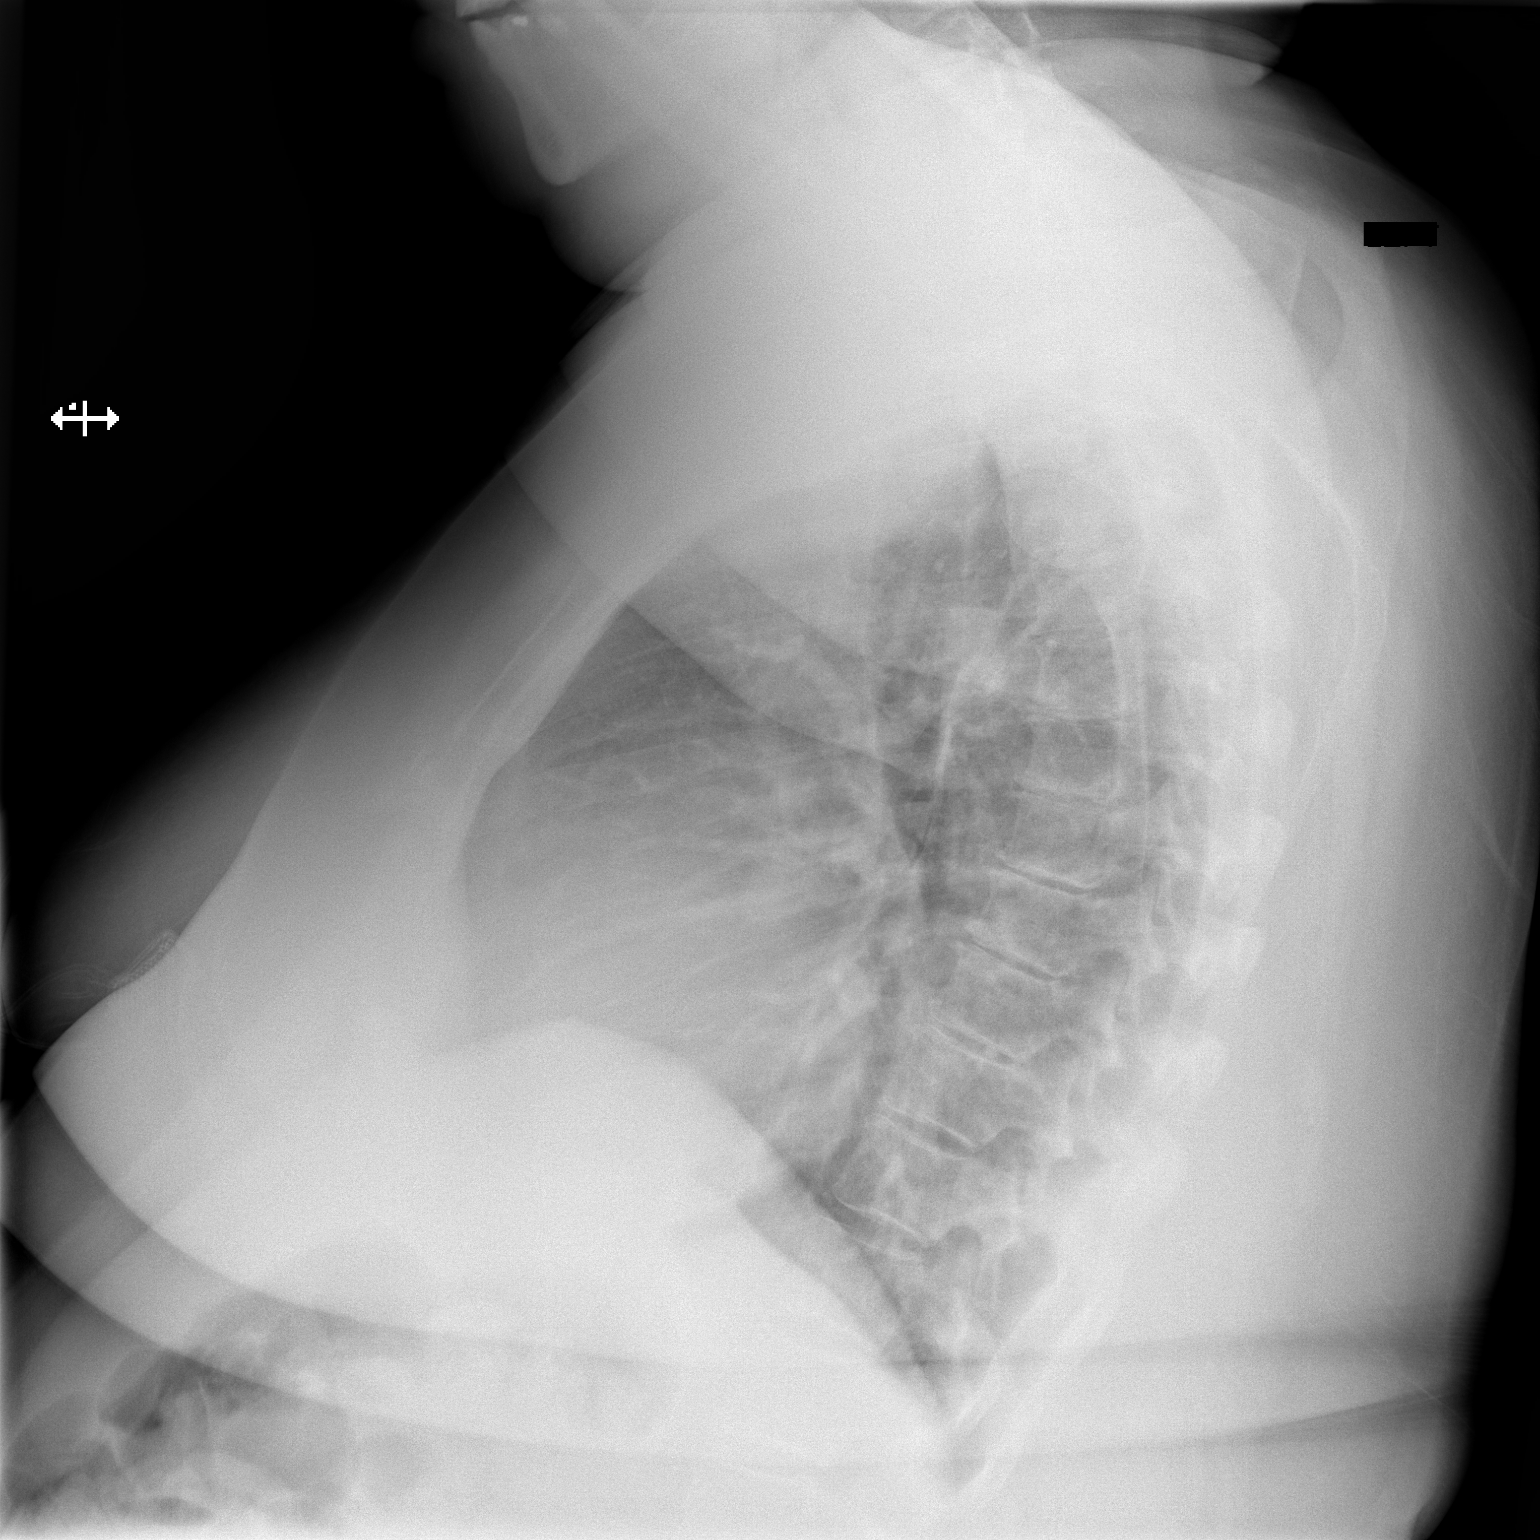

[2 of 2 positions shown; findings below may reference images not displayed]

FINDINGS: Heart size upper normal limits to mildly enlarged.  Mild
central vascular congestion.  Mild interstitial prominence without
focal consolidation, pleural effusion, or pneumothorax.  No acute
osseous abnormality.  Multilevel degenerative changes.
IMPRESSION: Heart size upper normal limits to mildly enlarged with central
vascular congestion.  Mild interstitial prominence without focal
consolidation.

## 2013-09-19 ENCOUNTER — Ambulatory Visit (INDEPENDENT_AMBULATORY_CARE_PROVIDER_SITE_OTHER): Payer: Medicare HMO | Admitting: Family Medicine

## 2013-09-19 ENCOUNTER — Encounter: Payer: Self-pay | Admitting: Family Medicine

## 2013-09-19 VITALS — BP 128/75 | HR 67 | Temp 98.1°F | Ht 63.0 in | Wt 261.0 lb

## 2013-09-19 DIAGNOSIS — B370 Candidal stomatitis: Secondary | ICD-10-CM

## 2013-09-19 DIAGNOSIS — E1110 Type 2 diabetes mellitus with ketoacidosis without coma: Secondary | ICD-10-CM

## 2013-09-19 DIAGNOSIS — Z23 Encounter for immunization: Secondary | ICD-10-CM

## 2013-09-19 DIAGNOSIS — E1310 Other specified diabetes mellitus with ketoacidosis without coma: Secondary | ICD-10-CM

## 2013-09-19 HISTORY — DX: Candidal stomatitis: B37.0

## 2013-09-19 LAB — POCT GLYCOSYLATED HEMOGLOBIN (HGB A1C): Hemoglobin A1C: 5.9

## 2013-09-19 MED ORDER — NYSTATIN 100000 UNIT/ML MT SUSP: 5.0000 mL | Freq: Four times a day (QID) | OROMUCOSAL | Status: DC

## 2013-09-19 NOTE — Addendum Note (Signed)
Addended by: Enid Skeens K on: 09/19/2013 05:07 PM   Modules accepted: Orders

## 2013-09-19 NOTE — Assessment & Plan Note (Addendum)
Needs A1C - way overdue  Return two weeks for HPDP including eye and foot exams.

## 2013-09-19 NOTE — Progress Notes (Signed)
   Subjective:    Patient ID: Tiffany [name], female    DOB: Oct 30, 1960, 53 y.o.   MRN: [id number]  HPI SDA for sore throat and mouth.  Had white spots on mouth and tongue.  Gargled with peroxide.  Now just sore.   No fever, no cough or SOB.  Non smoker.  Way behind on HPDP  Needs a health maint visit.      Review of Systems     Objective:   Physical Exam PE. Tongue is bald, denuded as is cheek mucosa. No sig nodes in neck       Assessment & Plan:

## 2013-09-19 NOTE — Assessment & Plan Note (Signed)
Most likely and will treat as such.   Also could be chemical irritation from peroxide gargle.

## 2013-09-19 NOTE — Patient Instructions (Signed)
I am pretty sure your sore mouth is due to thrush.  I sent in medication to swish and swallow. You got a flu and pneumonia shot today. I will check your A1C - three month diabetes test. See Dr. Venetia Maxon in 2-3 weeks to talk just about how to stay healthy.  Immunizations, eye exam, foot exam, colonoscopy, mammogram - you have a lot of things that need to be caught up.

## 2013-09-29 ENCOUNTER — Other Ambulatory Visit: Payer: Self-pay | Admitting: Family Medicine

## 2013-09-30 MED ORDER — TRAMADOL HCL 50 MG PO TABS: 50.0000 mg | ORAL_TABLET | Freq: Two times a day (BID) | ORAL | Status: DC | PRN

## 2013-11-11 ENCOUNTER — Other Ambulatory Visit: Payer: Self-pay | Admitting: Family Medicine

## 2013-11-11 ENCOUNTER — Other Ambulatory Visit: Payer: Self-pay | Admitting: *Deleted

## 2013-11-11 MED ORDER — NAPROXEN 500 MG PO TABS: 500.0000 mg | ORAL_TABLET | Freq: Two times a day (BID) | ORAL | Status: DC

## 2013-11-11 MED ORDER — FLUTICASONE PROPIONATE 50 MCG/ACT NA SUSP: 2.0000 | Freq: Every day | NASAL | Status: DC

## 2013-11-26 ENCOUNTER — Ambulatory Visit: Payer: Medicare HMO | Admitting: Family Medicine

## 2013-12-04 ENCOUNTER — Ambulatory Visit: Payer: Medicare HMO | Admitting: Family Medicine

## 2013-12-17 ENCOUNTER — Emergency Department (HOSPITAL_COMMUNITY): Payer: Commercial Managed Care - HMO

## 2013-12-17 ENCOUNTER — Emergency Department (HOSPITAL_COMMUNITY)
Admission: EM | Admit: 2013-12-17 | Discharge: 2013-12-17 | Disposition: A | Discharge status: Home / Self Care | Payer: Commercial Managed Care - HMO | Attending: Emergency Medicine | Admitting: Emergency Medicine

## 2013-12-17 ENCOUNTER — Encounter (HOSPITAL_COMMUNITY): Payer: Self-pay | Admitting: Emergency Medicine

## 2013-12-17 DIAGNOSIS — R110 Nausea: Secondary | ICD-10-CM | POA: Insufficient documentation

## 2013-12-17 DIAGNOSIS — Z7951 Long term (current) use of inhaled steroids: Secondary | ICD-10-CM | POA: Insufficient documentation

## 2013-12-17 DIAGNOSIS — E119 Type 2 diabetes mellitus without complications: Secondary | ICD-10-CM | POA: Diagnosis not present

## 2013-12-17 DIAGNOSIS — Z8673 Personal history of transient ischemic attack (TIA), and cerebral infarction without residual deficits: Secondary | ICD-10-CM | POA: Insufficient documentation

## 2013-12-17 DIAGNOSIS — Z87891 Personal history of nicotine dependence: Secondary | ICD-10-CM | POA: Insufficient documentation

## 2013-12-17 DIAGNOSIS — F329 Major depressive disorder, single episode, unspecified: Secondary | ICD-10-CM | POA: Diagnosis not present

## 2013-12-17 DIAGNOSIS — Z79899 Other long term (current) drug therapy: Secondary | ICD-10-CM | POA: Diagnosis not present

## 2013-12-17 DIAGNOSIS — J45909 Unspecified asthma, uncomplicated: Secondary | ICD-10-CM | POA: Insufficient documentation

## 2013-12-17 DIAGNOSIS — W19XXXA Unspecified fall, initial encounter: Secondary | ICD-10-CM

## 2013-12-17 DIAGNOSIS — I213 ST elevation (STEMI) myocardial infarction of unspecified site: Secondary | ICD-10-CM | POA: Insufficient documentation

## 2013-12-17 DIAGNOSIS — R55 Syncope and collapse: Secondary | ICD-10-CM | POA: Insufficient documentation

## 2013-12-17 DIAGNOSIS — I252 Old myocardial infarction: Secondary | ICD-10-CM | POA: Diagnosis not present

## 2013-12-17 DIAGNOSIS — F419 Anxiety disorder, unspecified: Secondary | ICD-10-CM | POA: Insufficient documentation

## 2013-12-17 DIAGNOSIS — Z791 Long term (current) use of non-steroidal anti-inflammatories (NSAID): Secondary | ICD-10-CM | POA: Insufficient documentation

## 2013-12-17 LAB — CBC WITH DIFFERENTIAL/PLATELET
Basophils Absolute: 0 10*3/uL (ref 0.0–0.1)
Basophils Relative: 0 % (ref 0–1)
Eosinophils Absolute: 0.2 10*3/uL (ref 0.0–0.7)
Eosinophils Relative: 2 % (ref 0–5)
HCT: 37.2 % (ref 36.0–46.0)
Hemoglobin: 11.8 g/dL — ABNORMAL LOW (ref 12.0–15.0)
Lymphocytes Relative: 30 % (ref 12–46)
Lymphs Abs: 2.4 10*3/uL (ref 0.7–4.0)
MCH: 29.4 pg (ref 26.0–34.0)
MCHC: 31.7 g/dL (ref 30.0–36.0)
MCV: 92.5 fL (ref 78.0–100.0)
Monocytes Absolute: 0.4 10*3/uL (ref 0.1–1.0)
Monocytes Relative: 5 % (ref 3–12)
Neutro Abs: 4.9 10*3/uL (ref 1.7–7.7)
Neutrophils Relative %: 63 % (ref 43–77)
Platelets: 351 10*3/uL (ref 150–400)
RBC: 4.02 MIL/uL (ref 3.87–5.11)
RDW: 15.2 % (ref 11.5–15.5)
WBC: 7.9 10*3/uL (ref 4.0–10.5)

## 2013-12-17 LAB — I-STAT CHEM 8, ED
BUN: 21 mg/dL (ref 6–23)
Calcium, Ion: 1.15 mmol/L (ref 1.12–1.23)
Chloride: 108 mEq/L (ref 96–112)
Creatinine, Ser: 1.4 mg/dL — ABNORMAL HIGH (ref 0.50–1.10)
Glucose, Bld: 89 mg/dL (ref 70–99)
HCT: 40 % (ref 36.0–46.0)
Hemoglobin: 13.6 g/dL (ref 12.0–15.0)
Potassium: 4.5 mEq/L (ref 3.7–5.3)
Sodium: 140 mEq/L (ref 137–147)
TCO2: 24 mmol/L (ref 0–100)

## 2013-12-17 LAB — I-STAT TROPONIN, ED: Troponin i, poc: 0 ng/mL (ref 0.00–0.08)

## 2013-12-17 LAB — URINALYSIS, ROUTINE W REFLEX MICROSCOPIC
Bilirubin Urine: NEGATIVE
Glucose, UA: NEGATIVE mg/dL
Hgb urine dipstick: NEGATIVE
Ketones, ur: NEGATIVE mg/dL
Leukocytes, UA: NEGATIVE
Nitrite: NEGATIVE
Protein, ur: NEGATIVE mg/dL
Specific Gravity, Urine: 1.013 (ref 1.005–1.030)
Urobilinogen, UA: 0.2 mg/dL (ref 0.0–1.0)
pH: 5 (ref 5.0–8.0)

## 2013-12-17 LAB — CBG MONITORING, ED: Glucose-Capillary: 87 mg/dL (ref 70–99)

## 2013-12-17 MED ORDER — HYDROCODONE-ACETAMINOPHEN 5-325 MG PO TABS: 1.0000 | ORAL_TABLET | ORAL | Status: DC | PRN

## 2013-12-17 MED ORDER — SODIUM CHLORIDE 0.9 % IV BOLUS (SEPSIS)
1000.0000 mL | Freq: Once | INTRAVENOUS | Status: AC
  Administered 2013-12-17: 1000 mL via INTRAVENOUS

## 2013-12-17 NOTE — ED Notes (Signed)
Pt remains in xray.

## 2013-12-17 NOTE — Discharge Instructions (Signed)
Please follow the directions provided.  Be sure to follow-up with your primary care provider to ensure you are getting better.  Your creatinine was slightly elevated today.  This may be due to daily naproxen use.  Please avoid naproxen and ibuprofen and have your doctor re-check this lab.  You may take a vicodin every 4 hours for pain but be cautious as it can make you sleepy.  Be sure to drink plenty of fluids, eat meals regularly, and change your position slowly.  Don't hesitate to return for any new, worsening or concerning symptoms.      SEEK IMMEDIATE MEDICAL CARE IF:  You have a severe headache.  You have unusual pain in the chest, abdomen, or back.  You are bleeding from the mouth or rectum, or you have black or tarry stool.  You have an irregular or very fast heartbeat.  You have repeated fainting or have seizure-like jerking during an episode.  You faint when sitting or lying down.  You have confusion.  You have difficulty walking.  You have severe weakness.  You have vision problems.

## 2013-12-17 NOTE — ED Provider Notes (Signed)
CSN: [id number]     Arrival date & time 12/17/13  1548 History   First MD Initiated Contact with Patient 12/17/13 1553     Chief Complaint  Patient presents with  . Loss of Consciousness   (Consider location/radiation/quality/duration/timing/severity/associated sxs/prior Treatment) HPI Tiffany [name] is a 53 yo female presenting with report of syncopal episode today.  She reports she felt well this morning but while traveling on the bus began to feel light-headed and nauseated.  She states while visiting family at the hospital she began to feel warm and light-headed.  SHe reports not eating b-fast or lunch today because she was in a hurry to run errands. She doesn't remember passing out but she remembers hearing people around her after she fell.  She recalls hitting her head and her back on the floor.  She was able stand up and ambulate afterward but has continued feeling tired and generally weak.  She endorses some intermittent dysuria over the last 2 weeks but denies any vomiting, fevers.  She complains of a pain to the back of her head and neck and back pain since the fall. She denies any chest pain, shortness of breath, abd pain, blurred vision, slurred speech, or focal weakness related to her syncopal episode.    Past Medical History  Diagnosis Date  . High blood pressure   . Incontinence   . Anxiety   . Manic depression   . Heart attack   . Diabetes mellitus     class 2  . Asthma   . Emphysema   . High cholesterol   . Stroke   . Sinus trouble   . Seasonal allergies   . Sleep apnea   . Disorder of nervous system   . Myocardial infarct   . Complication of anesthesia     decreased bp, decreased heart rate   Past Surgical History  Procedure Laterality Date  . Abdominal hysterectomy    . Cesarean section    . Hiatal hernia repair     Family History  Problem Relation Age of Onset  . Cancer Father     prostate  . Heart failure      cousin  . Cancer Mother     lung    History  Substance Use Topics  . Smoking status: Former Smoker    Types: Cigarettes    Quit date: 04/23/1977  . Smokeless tobacco: Never Used  . Alcohol Use: No   OB History    No data available     Review of Systems  Constitutional: Negative for fever and chills.  HENT: Negative for sore throat.   Eyes: Negative for visual disturbance.  Respiratory: Negative for cough and shortness of breath.   Cardiovascular: Negative for chest pain and leg swelling.  Gastrointestinal: Positive for nausea. Negative for vomiting and diarrhea.  Genitourinary: Positive for dysuria.  Musculoskeletal: Positive for back pain. Negative for myalgias.  Skin: Negative for rash.  Neurological: Positive for syncope and headaches. Negative for weakness and numbness.    Allergies  Codeine; Hydroxyzine; and Sulfa antibiotics  Home Medications   Prior to Admission medications   Medication Sig Start Date End Date Taking? Authorizing Provider  albuterol (PROVENTIL HFA;VENTOLIN HFA) 108 (90 BASE) MCG/ACT inhaler Inhale 2 puffs into the lungs every 6 (six) hours as needed. 07/15/13   Sharon Mt Street, MD  albuterol (PROVENTIL) (2.5 MG/3ML) 0.083% nebulizer solution INHALE CONTENTS OF 1 VIAL VIA NEBULIZER EVERY 6  HOURS AS NEEDED FOR SHORTNESS OF BREATH.  07/15/13   Sharon Mt Street, MD  ALPRAZolam Duanne Moron) 1 MG tablet Take 1 tablet (1 mg total) by mouth at bedtime as needed for anxiety. 07/15/13   [place], MD  atorvastatin (LIPITOR) 40 MG tablet Take 1 tablet (40 mg total) by mouth daily. 07/15/13   Sharon Mt Street, MD  budesonide-formoterol [facility]) 160-4.5 MCG/ACT inhaler Inhale 2 puffs into the lungs 2 (two) times daily. 07/15/13   [place], MD  cloNIDine (CATAPRES) 0.2 MG tablet Take 2 tablets (0.4 mg total) by mouth 3 (three) times daily. 07/15/13   [place], MD  cyclobenzaprine (FLEXERIL) 10 MG tablet Take 1 tablet (10 mg total) by mouth 3 (three) times daily  as needed for muscle spasms. 07/15/13   Sharon Mt Street, MD  fexofenadine (ALLEGRA) 60 MG tablet Take 2 tablets (120 mg total) by mouth 2 (two) times daily. 07/15/13   Sharon Mt Street, MD  fluticasone [facility]) 50 MCG/ACT nasal spray Place 2 sprays into both nostrils daily. 11/11/13   [place], MD  furosemide (LASIX) 40 MG tablet Take 1 tablet (40 mg total) by mouth daily. 07/15/13   Sharon Mt Street, MD  gabapentin (NEURONTIN) 600 MG tablet Take 1 tablet (600 mg total) by mouth 3 (three) times daily. 07/15/13   [place], MD  metFORMIN (GLUMETZA) 1000 MG (MOD) 24 hr tablet Take 1 tablet (1,000 mg total) by mouth daily with breakfast. 07/15/13   Sharon Mt Street, MD  montelukast (SINGULAIR) 10 MG tablet Take 1 tablet (10 mg total) by mouth at bedtime. 07/15/13   Sharon Mt Street, MD  naproxen (NAPROSYN) 500 MG tablet Take 1 tablet (500 mg total) by mouth 2 (two) times daily with a meal. 11/11/13   Sharon Mt Street, MD  nystatin (MYCOSTATIN) 100000 UNIT/ML suspension TAKE 5 MLS (500,000 UNITS TOTAL) BY MOUTH 4 (FOUR) TIMES DAILY. 11/12/13   [place], MD  omeprazole (PRILOSEC) 40 MG capsule Take 1 capsule (40 mg total) by mouth daily. 07/15/13   [place], MD  oxybutynin (DITROPAN) 5 MG tablet Take 1 tablet (5 mg total) by mouth 3 (three) times daily. 07/15/13   Sharon Mt Street, MD  potassium chloride (K-DUR) 10 MEQ tablet Take 2 tablets (20 mEq total) by mouth daily. 07/15/13   [place], MD  traMADol (ULTRAM) 50 MG tablet Take 1-2 tablets (50-100 mg total) by mouth every 12 (twelve) hours as needed. Filled via fax, [place], 09/29/13. 09/30/13   Sharon Mt Street, MD  traZODone (DESYREL) 150 MG tablet Take 1 tablet (150 mg total) by mouth at bedtime. 01/21/13   Kandis Nab, MD  verapamil (VERELAN PM) 360 MG 24 hr capsule Take 1 capsule (360 mg total) by mouth at bedtime. 07/15/13   Sharon Mt Street, MD   ziprasidone (GEODON) 80 MG capsule Take 1 capsule (80 mg total) by mouth 2 (two) times daily with a meal. 01/21/13   Kandis Nab, MD   BP 104/71 mmHg  Pulse 58  Temp(Src) 97.9 F (36.6 C) (Oral)  Resp 16  Ht 5\' 4"  (1.626 m)  Wt 241 lb (109.317 kg)  BMI 41.35 kg/m2  SpO2 100% Physical Exam  Constitutional: She appears well-developed and well-nourished. No distress.  HENT:  Head: Normocephalic and atraumatic.    Mouth/Throat: Oropharynx is clear and moist. No oropharyngeal exudate.  No visible injury, but TTP to left occipital scalp  Eyes: Conjunctivae are normal.  Neck: Neck supple. No thyromegaly  present.  Cardiovascular: Normal rate, regular rhythm and intact distal pulses.   Pulmonary/Chest: Effort normal and breath sounds normal. No respiratory distress. She has no wheezes. She has no rales. She exhibits no tenderness.  Abdominal: Soft. There is no tenderness.  Musculoskeletal:       Cervical back: She exhibits tenderness and bony tenderness.       Lumbar back: She exhibits tenderness and bony tenderness.       Back:  Lymphadenopathy:    She has no cervical adenopathy.  Neurological: [name] alert.  Skin: Skin is warm and dry. No rash noted. [name] not diaphoretic.  Psychiatric: She has a normal mood and affect.  Nursing note and vitals reviewed.   ED Course  Procedures (including critical care time) Labs Review Labs Reviewed  CBC WITH DIFFERENTIAL - Abnormal; Notable for the following:    Hemoglobin 11.8 (*)    All other components within normal limits  I-STAT CHEM 8, ED - Abnormal; Notable for the following:    Creatinine, Ser 1.40 (*)    All other components within normal limits  URINALYSIS, ROUTINE W REFLEX MICROSCOPIC  CBG MONITORING, ED  I-STAT TROPOININ, ED    Imaging Review  CERVICAL SPINE 4+ VIEWS  COMPARISON: None.  FINDINGS: The cervical spine is visualized through the top of C4 on the lateral view. The swimmer's view is suboptimal.  There is suspected prevertebral soft tissue thickening degenerative changes throughout the cervical spine facets is noted. There is lucency through the odontoid neck. Fracture is not excluded. This may represent artifact.  IMPRESSION: There is lucency through the odontoid neck as well as prevertebral soft tissue swelling. The lateral cervical spine view and swimmer's view are some optimal with poor visualization. Cervical spine injury cannot be excluded by this study. CT scan is recommended to further characterize.  EXAM: LUMBAR SPINE - COMPLETE 4+ VIEW  COMPARISON: None.  FINDINGS: Lower lumbar facet arthropathy without significant subluxation. No lumbar spine fracture observed. Mildly exaggerated lumbar lordosis.  IMPRESSION: 1. Lower lumbar spondylosis. No acute lumbar spine findings are radiographically apparent.  CT CERVICAL SPINE WITHOUT CONTRAST  TECHNIQUE: Multidetector CT imaging of the cervical spine was performed without intravenous contrast. Multiplanar CT image reconstructions were also generated.  COMPARISON: Radiographs 12/17/2013.  FINDINGS: Examination is limited due to morbid obesity. The cervical vertebral bodies are grossly normally aligned. No acute fracture is identified. The facets are normally aligned. No facet or laminar fractures. The skullbase C1 and C1-2 articulations are maintained. The dens is normal.  IMPRESSION: Normal alignment and no acute bony findings.   EKG Interpretation   Date/Time:  Wednesday December 17 2013 16:35:17 EST Ventricular Rate:  57 PR Interval:  182 QRS Duration: 82 QT Interval:  483 QTC Calculation: 470 R Axis:   14 Text Interpretation:  Sinus rhythm ED PHYSICIAN INTERPRETATION AVAILABLE  IN [facility] [employer] Confirmed by TEST, Record ([id number]) on 12/20/2013 12:45:52  PM      MDM   Final diagnoses:  Fall  Syncope, unspecified syncope type   53 yo female after syncopal episode complains of neck and  back pain, no cranial deformity or neuro deficits after fall.  CBC, I-stat 8, Troponin, EKG, UA,  X-ray, c-spine, l-spine, NS bolus.  Discussed case with Dr. Jeneen Rinks. C-spine x-ray cannot rule out fracture. CT c-spine negative for fracture.  Syncope seems related to poor fluid and nutritional intake, doubt syncopal episode related to cardiac or neuro etiology. Pt feels well after fluids.  Labs without significant  abnormality except for elevated creatinine from last analysis.  Pt reports daily NSAID use.  Discussed avoidance of NSAIDS and follow-up with PCP to re-check.  Pt is well-appearing, in no acute distress and vital signs are stable.  They appear safe to be discharged.  Return precautions provided.   Filed Vitals:   12/17/13 1830 12/17/13 1909 12/17/13 1915 12/17/13 1930  BP: 126/105 135/74 138/84 115/92  Pulse: 65 60 61 59  Temp:      TempSrc:      Resp: 16 18 15 14   Height:      Weight:      SpO2: 96% 96% 97% 97%   Meds given in ED:  Medications  sodium chloride 0.9 % bolus 1,000 mL (0 mLs Intravenous Stopped 12/17/13 1844)    Discharge Medication List as of 12/17/2013  7:55 PM    START taking these medications   Details  HYDROcodone-acetaminophen (NORCO/VICODIN) 5-325 MG per tablet Take 1 tablet by mouth every 4 (four) hours as needed for moderate pain or severe pain., Starting 12/17/2013, Until Discontinued, Print           Britt Bottom, NP 12/20/13 [place], MD 12/28/13 [phone]

## 2013-12-17 NOTE — ED Notes (Signed)
PT returned from scans. Pt monitored by pulse ox, bp cuff, and 5-lead.

## 2013-12-17 NOTE — ED Notes (Signed)
Returned from ct 

## 2013-12-17 NOTE — ED Notes (Signed)
Graham crackers peanut butter and soda given to pt

## 2013-12-17 NOTE — ED Notes (Signed)
PA-C at bedside.  PT monitored by pulse ox, bp cuff, and 12-lead.

## 2013-12-17 NOTE — ED Notes (Signed)
Pt has slow staggerd gait. Complaining of some knee pain. Pt denies light headedness or dizziness.

## 2013-12-17 NOTE — ED Notes (Signed)
PT ambulating in hallway with PA-C T[place]inger.

## 2013-12-17 NOTE — ED Notes (Signed)
Per EMS[facility] when she had a witnessed syncopal episode, pt states she became dizzy and lightheaded and had LOC. Pt did fall back and hit her head on the floor, denies taking any blood thinners. No injuries or deformities noted. EMS noted pt was lethargic for a few minutes but upon arrival pt was axo 4, able to follow commands and passed [facility]. nad noted. Pt c/o neck, head, and right leg pain.

## 2013-12-17 NOTE — ED Notes (Signed)
To ct

## 2013-12-28 IMAGING — CR DG CHEST 2V
2 series · 2 of 2 positions shown · non-contrast
Comparison: 10/03/2010

CLINICAL DATA: Chest pain and hematemesis

CHEST - 2 VIEW

[w chest pa]
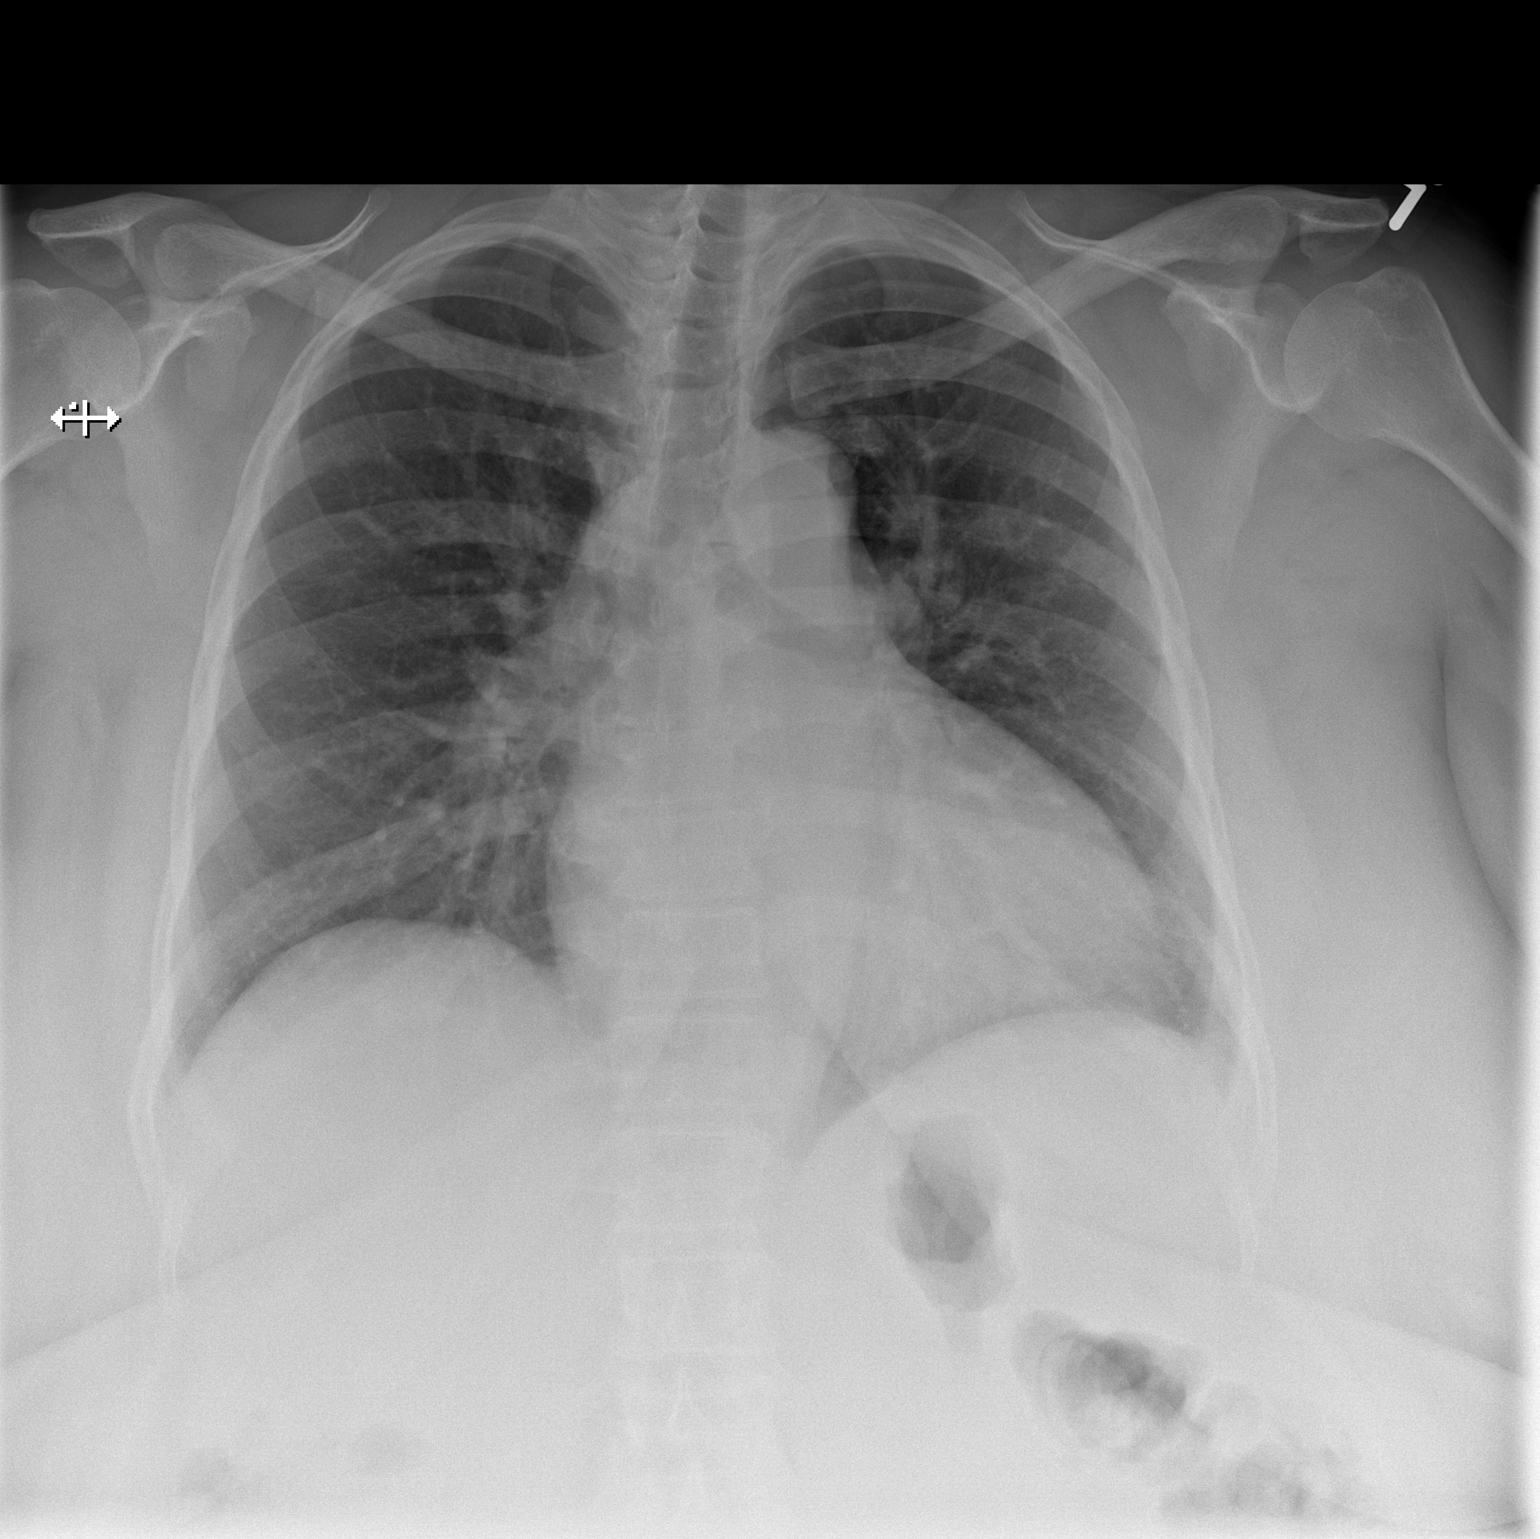

[w chest lat]
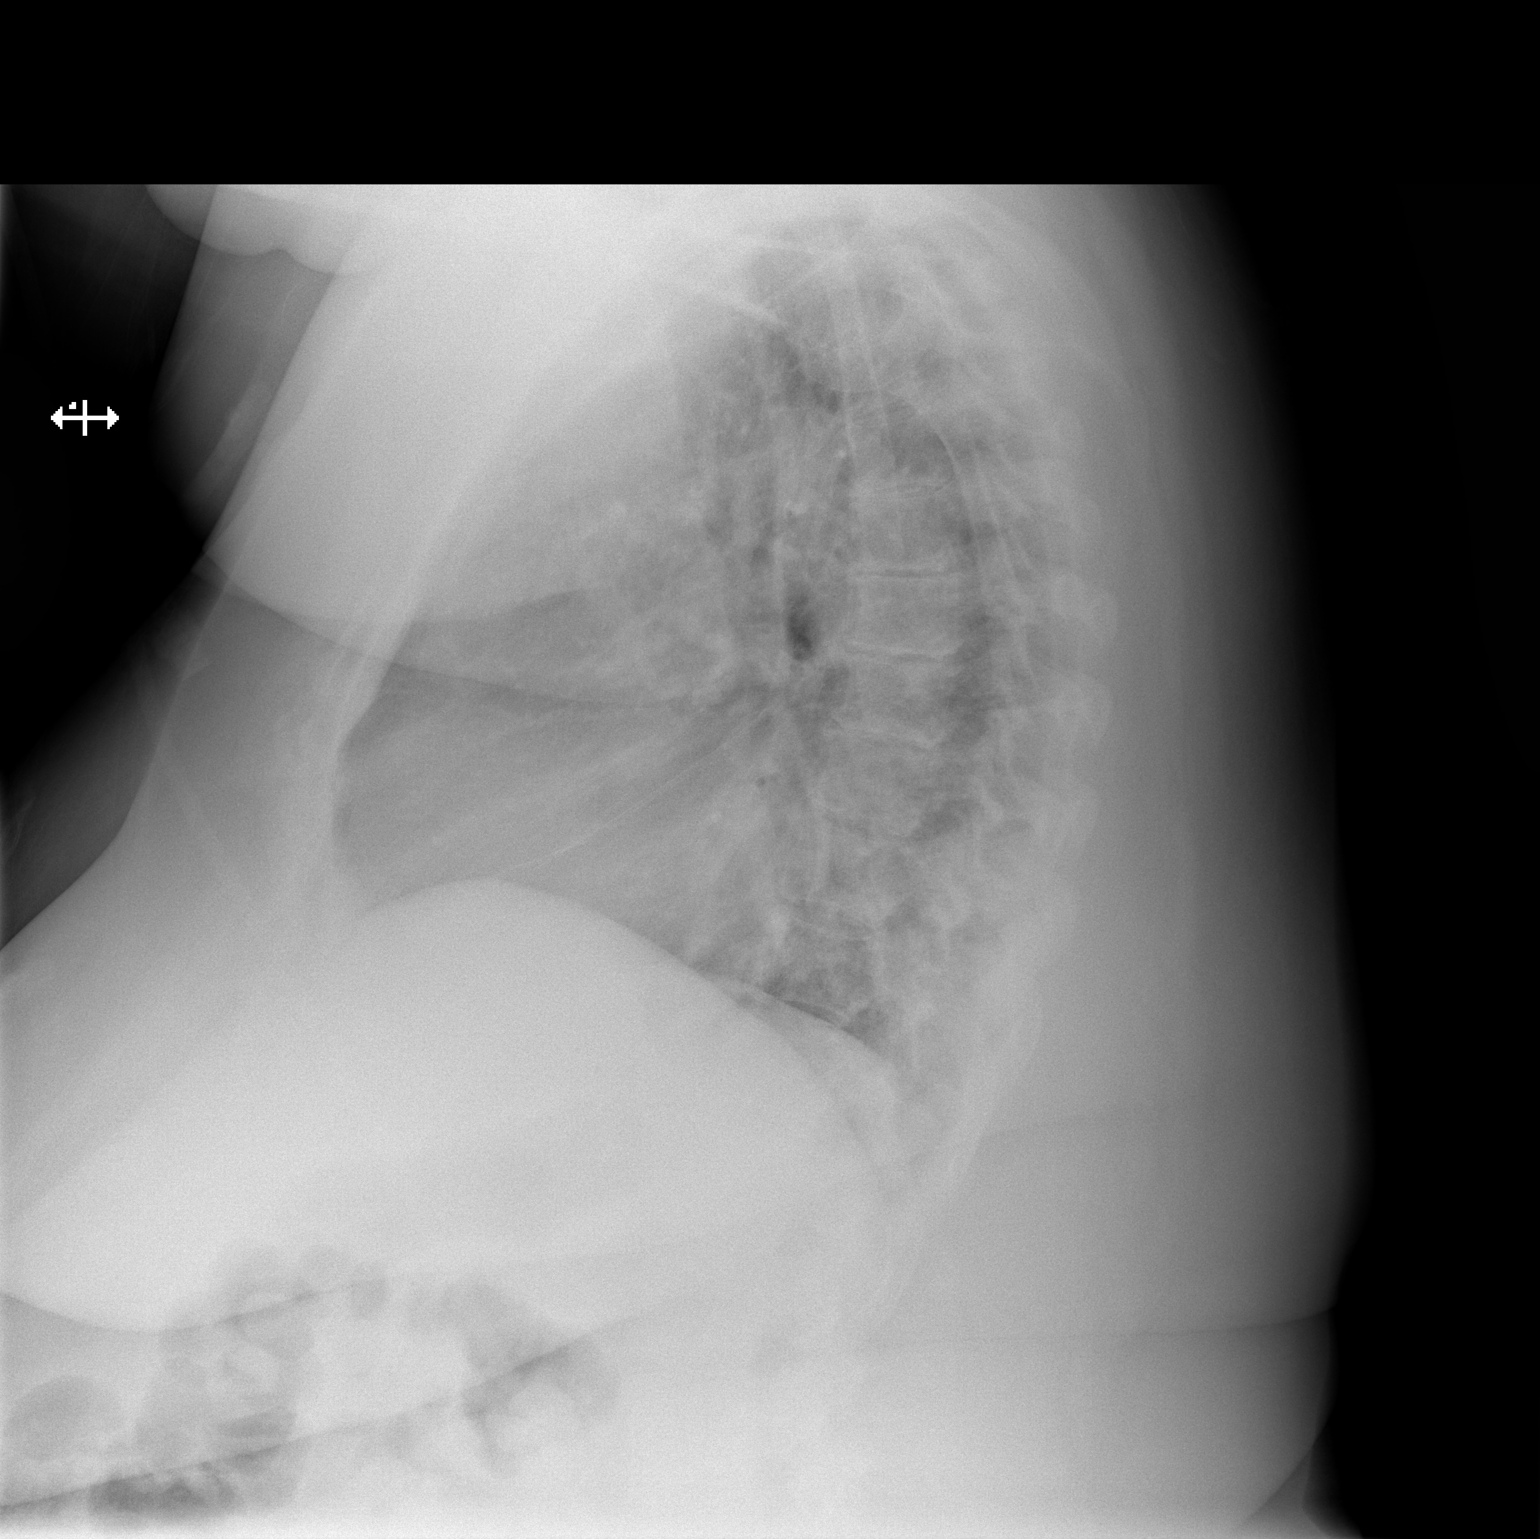

[2 of 2 positions shown; findings below may reference images not displayed]

FINDINGS: Mild cardiac enlargement with relatively normal pulmonary
vascularity.  No focal airspace consolidation or edema.  No
blunting of costophrenic angles.  No pneumothorax.  Degenerative
changes in the spine.  No acute changes since previous study.
IMPRESSION: Cardiac enlargement.  No evidence of active pulmonary disease.

## 2013-12-31 ENCOUNTER — Telehealth: Payer: Self-pay | Admitting: Family Medicine

## 2013-12-31 ENCOUNTER — Encounter: Payer: Self-pay | Admitting: Family Medicine

## 2013-12-31 ENCOUNTER — Ambulatory Visit (INDEPENDENT_AMBULATORY_CARE_PROVIDER_SITE_OTHER): Payer: Commercial Managed Care - HMO | Admitting: Family Medicine

## 2013-12-31 VITALS — BP 126/81 | HR 56 | Temp 98.5°F | Ht 64.0 in | Wt 237.0 lb

## 2013-12-31 DIAGNOSIS — L821 Other seborrheic keratosis: Secondary | ICD-10-CM

## 2013-12-31 DIAGNOSIS — R748 Abnormal levels of other serum enzymes: Secondary | ICD-10-CM

## 2013-12-31 DIAGNOSIS — M25561 Pain in right knee: Secondary | ICD-10-CM

## 2013-12-31 DIAGNOSIS — M25562 Pain in left knee: Secondary | ICD-10-CM

## 2013-12-31 DIAGNOSIS — R7989 Other specified abnormal findings of blood chemistry: Secondary | ICD-10-CM

## 2013-12-31 HISTORY — DX: Other seborrheic keratosis: L82.1

## 2013-12-31 LAB — BASIC METABOLIC PANEL
BUN: 10 mg/dL (ref 6–23)
CO2: 26 mEq/L (ref 19–32)
Calcium: 9.2 mg/dL (ref 8.4–10.5)
Chloride: 107 mEq/L (ref 96–112)
Creat: 1.01 mg/dL (ref 0.50–1.10)
Glucose, Bld: 92 mg/dL (ref 70–99)
Potassium: 4.5 mEq/L (ref 3.5–5.3)
Sodium: 141 mEq/L (ref 135–145)

## 2013-12-31 MED ORDER — HYDROCODONE-ACETAMINOPHEN 5-325 MG PO TABS: 1.0000 | ORAL_TABLET | Freq: Four times a day (QID) | ORAL | Status: DC | PRN

## 2013-12-31 MED ORDER — ALBUTEROL SULFATE HFA 108 (90 BASE) MCG/ACT IN AERS: 2.0000 | INHALATION_SPRAY | Freq: Four times a day (QID) | RESPIRATORY_TRACT | Status: DC | PRN

## 2013-12-31 MED ORDER — BUDESONIDE-FORMOTEROL FUMARATE 160-4.5 MCG/ACT IN AERO: 2.0000 | INHALATION_SPRAY | Freq: Two times a day (BID) | RESPIRATORY_TRACT | Status: DC

## 2013-12-31 MED ORDER — POTASSIUM CHLORIDE ER 10 MEQ PO TBCR: 20.0000 meq | EXTENDED_RELEASE_TABLET | Freq: Every day | ORAL | Status: DC

## 2013-12-31 MED ORDER — OXYBUTYNIN CHLORIDE 5 MG PO TABS: 5.0000 mg | ORAL_TABLET | Freq: Three times a day (TID) | ORAL | Status: DC

## 2013-12-31 MED ORDER — METFORMIN HCL ER (MOD) 1000 MG PO TB24: 1000.0000 mg | ORAL_TABLET | Freq: Every day | ORAL | Status: DC

## 2013-12-31 MED ORDER — VERAPAMIL HCL ER 360 MG PO CP24: 360.0000 mg | ORAL_CAPSULE | Freq: Every day | ORAL | Status: DC

## 2013-12-31 MED ORDER — OMEPRAZOLE 40 MG PO CPDR: 40.0000 mg | DELAYED_RELEASE_CAPSULE | Freq: Two times a day (BID) | ORAL | Status: DC

## 2013-12-31 MED ORDER — GABAPENTIN 600 MG PO TABS: 600.0000 mg | ORAL_TABLET | Freq: Three times a day (TID) | ORAL | Status: DC

## 2013-12-31 MED ORDER — CLONIDINE HCL 0.2 MG PO TABS: 0.4000 mg | ORAL_TABLET | Freq: Two times a day (BID) | ORAL | Status: DC

## 2013-12-31 MED ORDER — TRAZODONE HCL 150 MG PO TABS: 150.0000 mg | ORAL_TABLET | Freq: Every day | ORAL | Status: DC

## 2013-12-31 MED ORDER — MONTELUKAST SODIUM 10 MG PO TABS
10.0000 mg | ORAL_TABLET | Freq: Every day | ORAL | Status: DC
Start: 2013-12-31 — End: 2014-07-22

## 2013-12-31 MED ORDER — NAPROXEN 500 MG PO TABS: 500.0000 mg | ORAL_TABLET | Freq: Two times a day (BID) | ORAL | Status: DC

## 2013-12-31 MED ORDER — FUROSEMIDE 40 MG PO TABS: 40.0000 mg | ORAL_TABLET | Freq: Every day | ORAL | Status: DC

## 2013-12-31 MED ORDER — FLUTICASONE PROPIONATE 50 MCG/ACT NA SUSP: 2.0000 | Freq: Every day | NASAL | Status: DC

## 2013-12-31 MED ORDER — ZIPRASIDONE HCL 80 MG PO CAPS: 80.0000 mg | ORAL_CAPSULE | Freq: Two times a day (BID) | ORAL | Status: DC

## 2013-12-31 MED ORDER — ALPRAZOLAM 1 MG PO TABS: 1.0000 mg | ORAL_TABLET | Freq: Every evening | ORAL | Status: DC | PRN

## 2013-12-31 MED ORDER — ATORVASTATIN CALCIUM 40 MG PO TABS: 40.0000 mg | ORAL_TABLET | Freq: Every day | ORAL | Status: DC

## 2013-12-31 MED ORDER — FEXOFENADINE HCL 60 MG PO TABS: 120.0000 mg | ORAL_TABLET | Freq: Two times a day (BID) | ORAL | Status: DC

## 2013-12-31 NOTE — Progress Notes (Signed)
   Subjective:    Patient ID: Tiffany [name], female    DOB: 18-Apr-1960, 53 y.o.   MRN: [id number]  HPI: Pt presents to clinic for knee pain after a fall a couple of weeks ago (see urgent care note; likely orthostatic hypotension with generally negative work-up). She also has lightheadedness / dizziness but this is better when she drinks and eats consistently. She also has a mole on her knee.  Knee pain - present for a couple of weeks since she fell (backwards) on 12/2 - she went to urgent care with a grossly negative work-up, with presumed syncope due to dehydration / poor PO intake - describes pain / sensation of the knee coming out of joint, as well as popping but no swelling or redness - describes pain in the front of her knee, increased with ROM, worse with moving and walking - these symptoms were not present before she fell - reports she is taking Aleve and Vicodin, which helped some (is now out of Vicodin) - states she had poor kidney function and would like it rechecked, today  Mole - on back of right knee, present for >1 year - states it has gotten completely black and is bigger than it was initially but not actively growing - no pain or tenderness reported  Review of Systems: As above. Denies fever / chills, N/V, chest pain, headache. Mild coryza-type symptoms.     Objective:   Physical Exam BP 126/81 mmHg  Pulse 56  Temp(Src) 98.5 F (36.9 C) (Oral)  Ht 5\' 4"  (1.626 m)  Wt 237 lb (107.502 kg)  BMI 40.66 kg/m2 Gen: well-appearing adult female in NAD HEENT: [place]/AT, EOMI, PERRLA, MM slightly dry Cardio: RRR, no murmur appreciated Pulm: CTAB, no wheezes Skin: irregular hyperkeratotic raised, rough-surfaced lesion to right knee popliteal fossa  Lesion is ovoid and approximately 1 cm x 2 cm, appearance consistent with seborrheic keratosis  Much smaller <0.5 cm lesion close to lesion noted with similar appearance MSK: bilateral knees normal to appearance  Right knee > left knee  but bilateral crepitus with passive ROM while holding compression over patellae  Bilateral diffuse tenderness of knees, worst on right, prepatellar area     Assessment & Plan:  53yo female with recent fall and persistent knee pain with known bilateral knee arthritis. - Cr elevated at recent urgent care visit - also with SK on right posterior knee, nontender / noninflamed  Plan: - conservative measures for knee pain - brace / sleeve PRN, Aleve for anti-inflammatory - Rx for Vicodin for severe breakthrough pain for knee - consider referral to ortho in the future - reassured on benign nature of SK, f/u PRN for consideration of cyrotherapy or derm referral if needed - recheck BMP today and advised good PO intake - f/u PRN, otherwise  Emmaline Kluver, MD PGY-3, [facility] 12/31/2013, 5:21 PM

## 2013-12-31 NOTE — Telephone Encounter (Signed)
Pt requested all medications sent as 90-day supply to [facility]. Orders placed, today. --CMS

## 2013-12-31 NOTE — Patient Instructions (Signed)
Thank you for coming in, today!  I think your knee pain is due to arthritis that was aggravated when you fell. It might take several more days before it feels any better. You can continue to take naproxyn and you can take Vicodin (hydrocodone-acetaminophen) if the pain is severe. If it continues to be there or gets worse, we might need to refer you to an orthopedic surgeon.  I will check your kidney function today and call you or send you a letter with the result.  The "mole" on your leg is not a mole, but rather something called a "seborrheic keratosis." These are benign, NOT CANCER, that are growths of skin with extra pigment in them. They can get irritated and itchy or red. If it continues to get bigger, painful, or red / irritated, it can be frozen off or removed. If you have many of them that are bothersome, you can be seen by a dermatologist for them.  Come back to see me as you need. Please feel free to call with any questions or concerns at any time, at [phone]. --Dr. Venetia Maxon

## 2014-01-01 ENCOUNTER — Encounter: Payer: Self-pay | Admitting: Family Medicine

## 2014-01-05 ENCOUNTER — Telehealth: Payer: Self-pay | Admitting: Family Medicine

## 2014-01-05 MED ORDER — OMEPRAZOLE 40 MG PO CPDR: 40.0000 mg | DELAYED_RELEASE_CAPSULE | Freq: Every day | ORAL | Status: DC

## 2014-01-05 MED ORDER — RANITIDINE HCL 150 MG PO TABS: 150.0000 mg | ORAL_TABLET | Freq: Two times a day (BID) | ORAL | Status: DC

## 2014-01-05 NOTE — Telephone Encounter (Signed)
Red Team: Please call pt to let her know that her insurance did not approve her Rx for omeprazole 40 mg twice per day, so I sent in a refill for 40 mg once per day (this is what she was taking before). Additionally, I sent in a prescription for Zantac (ranitidine) 150 mg twice per day -- these two medicines work together to help with heartburn symptoms, and adding the Zantac should do the same thing as having omeprazole twice per day. If she has any questions, she can come back in to see me any time, and if she would like to be referred to GI, I will be happy to do that for her without having to see her back. Thanks! --CMS

## 2014-01-05 NOTE — Telephone Encounter (Signed)
Left message on voicemail for patient to call back. 

## 2014-01-07 NOTE — Telephone Encounter (Signed)
Patient informed, expressed understanding. 

## 2014-01-07 NOTE — Telephone Encounter (Signed)
Left another message for patient to call back 

## 2014-01-22 DIAGNOSIS — F42 Obsessive-compulsive disorder: Secondary | ICD-10-CM | POA: Diagnosis not present

## 2014-01-29 ENCOUNTER — Other Ambulatory Visit: Payer: Self-pay | Admitting: *Deleted

## 2014-01-29 MED ORDER — OXYBUTYNIN CHLORIDE 5 MG PO TABS: 5.0000 mg | ORAL_TABLET | Freq: Three times a day (TID) | ORAL | Status: DC

## 2014-02-02 ENCOUNTER — Other Ambulatory Visit: Payer: Self-pay | Admitting: *Deleted

## 2014-02-02 MED ORDER — GABAPENTIN 600 MG PO TABS: 600.0000 mg | ORAL_TABLET | Freq: Three times a day (TID) | ORAL | Status: DC

## 2014-03-05 DIAGNOSIS — F42 Obsessive-compulsive disorder: Secondary | ICD-10-CM | POA: Diagnosis not present

## 2014-03-18 DIAGNOSIS — F319 Bipolar disorder, unspecified: Secondary | ICD-10-CM | POA: Diagnosis not present

## 2014-03-27 ENCOUNTER — Other Ambulatory Visit: Payer: Self-pay | Admitting: Family Medicine

## 2014-03-27 MED ORDER — TRAMADOL HCL 50 MG PO TABS: 50.0000 mg | ORAL_TABLET | Freq: Two times a day (BID) | ORAL | Status: DC | PRN

## 2014-03-27 MED ORDER — ATORVASTATIN CALCIUM 40 MG PO TABS: 40.0000 mg | ORAL_TABLET | Freq: Every day | ORAL | Status: DC

## 2014-03-27 MED ORDER — OXYBUTYNIN CHLORIDE 5 MG PO TABS: 5.0000 mg | ORAL_TABLET | Freq: Three times a day (TID) | ORAL | Status: DC

## 2014-03-27 MED ORDER — GABAPENTIN 600 MG PO TABS: 600.0000 mg | ORAL_TABLET | Freq: Three times a day (TID) | ORAL | Status: DC

## 2014-03-27 MED ORDER — OMEPRAZOLE 40 MG PO CPDR: 40.0000 mg | DELAYED_RELEASE_CAPSULE | Freq: Every day | ORAL | Status: DC

## 2014-03-27 MED ORDER — CLONIDINE HCL 0.2 MG PO TABS: 0.2000 mg | ORAL_TABLET | Freq: Three times a day (TID) | ORAL | Status: DC

## 2014-03-27 MED ORDER — NAPROXEN 500 MG PO TABS: 500.0000 mg | ORAL_TABLET | Freq: Two times a day (BID) | ORAL | Status: DC

## 2014-03-27 MED ORDER — POTASSIUM CHLORIDE ER 10 MEQ PO TBCR: 20.0000 meq | EXTENDED_RELEASE_TABLET | Freq: Every day | ORAL | Status: DC

## 2014-03-27 MED ORDER — METFORMIN HCL ER (MOD) 1000 MG PO TB24: 1000.0000 mg | ORAL_TABLET | Freq: Every day | ORAL | Status: DC

## 2014-03-27 NOTE — Telephone Encounter (Signed)
Need refills for potassium,gabapentin, naproxen,metformin, oxybutynin,omeprazole,atorvastatin, tramadol. On the clonidine she want to go back to taking 3 daily instead of 2.  Bp has been running high.

## 2014-03-27 NOTE — Telephone Encounter (Signed)
Meds sent in to [facility]. Tramadol faxed to [phone]. --CMS

## 2014-04-01 DIAGNOSIS — F319 Bipolar disorder, unspecified: Secondary | ICD-10-CM | POA: Diagnosis not present

## 2014-04-06 IMAGING — CT CT HEAD W/O CM
3 of 5 series · 17 of 40 positions shown, 19 images · non-contrast
Comparison: None.

CT HEAD

CLINICAL DATA: Fell 3 days ago, headache and facial pain.

CT HEAD WITHOUT CONTRAST
CT MAXILLOFACIAL WITHOUT CONTRAST
TECHNIQUE: Multidetector CT imaging of the head and maxillofacial
structures were performed using the standard protocol without
intravenous contrast. Multiplanar CT image reconstructions of the
maxillofacial structures were also generated.

[Series 6: facial st · axial · 0.31mm/px · z∈[-478,-358]mm · 7 of 80 slices shown, 9 images]
[im 10/80  brain]
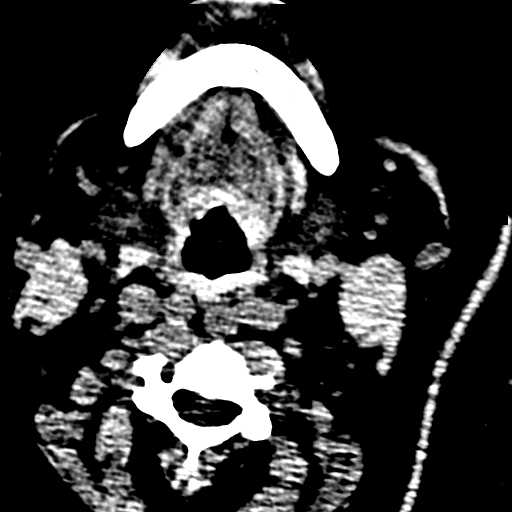
[im 10/80  bone]
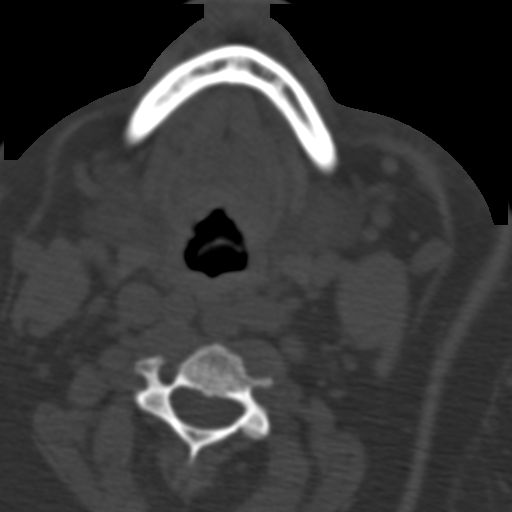
[im 20/80  brain]
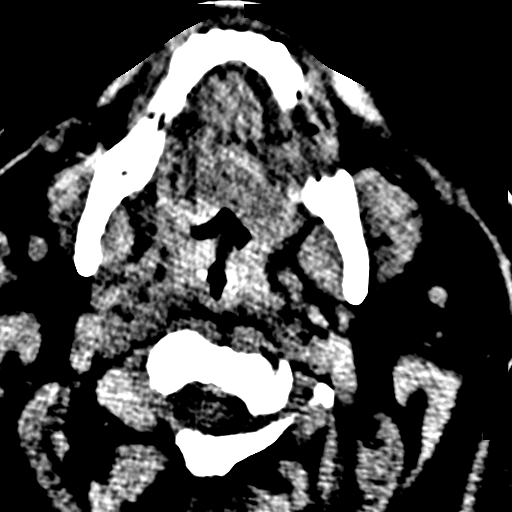
[im 30/80  brain]
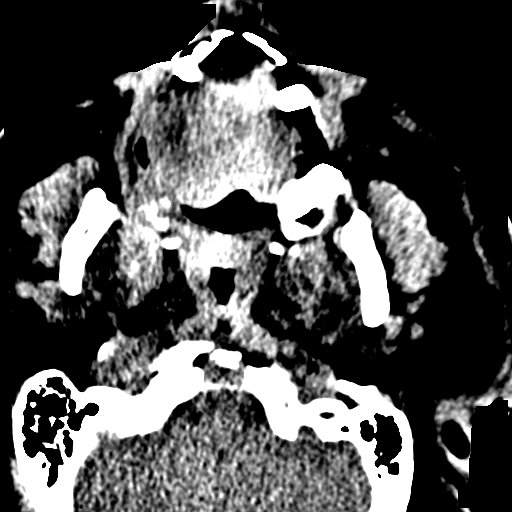
[im 40/80  brain]
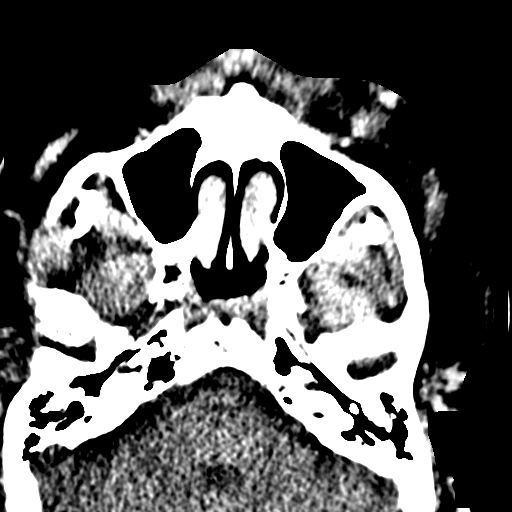
[im 50/80  brain]
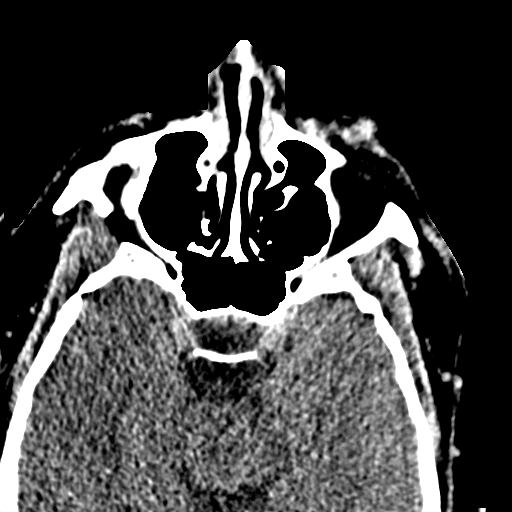
[im 50/80  bone]
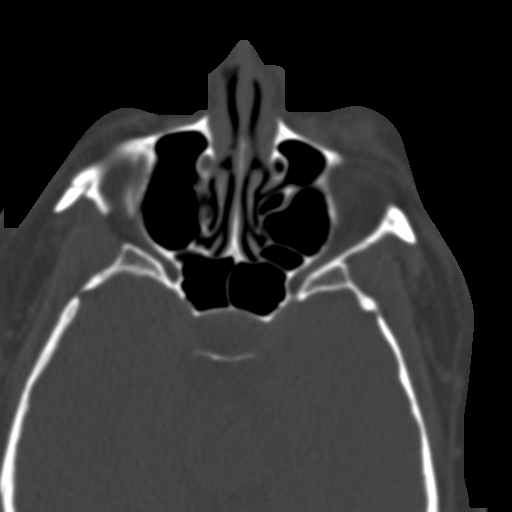
[im 60/80  brain]
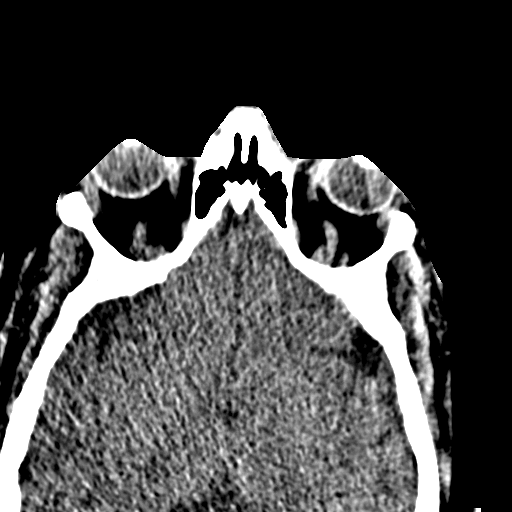
[im 70/80  brain]
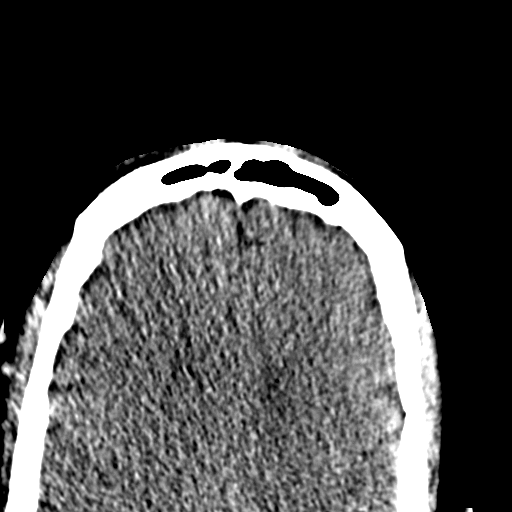

[Series 602: <mpr thick range> · axial · 0.31mm/px · z∈[-450,-345]mm · 7 of 77 slices shown]
[im 10/77  brain]
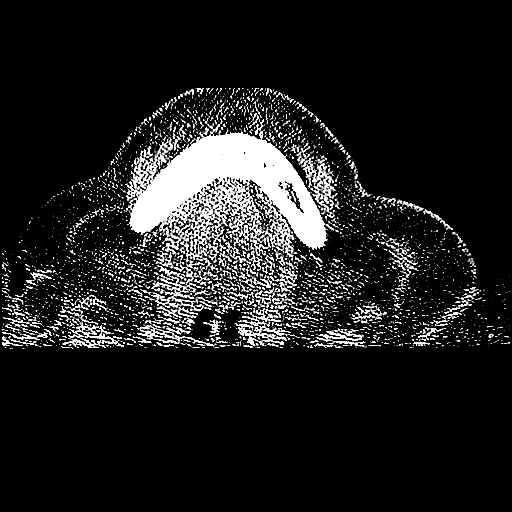
[im 20/77  brain]
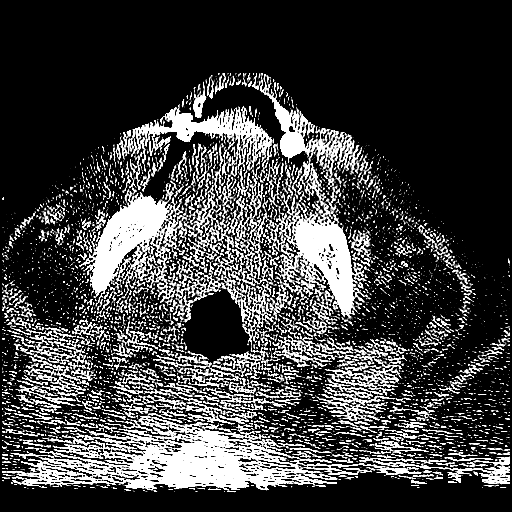
[im 29/77  brain]
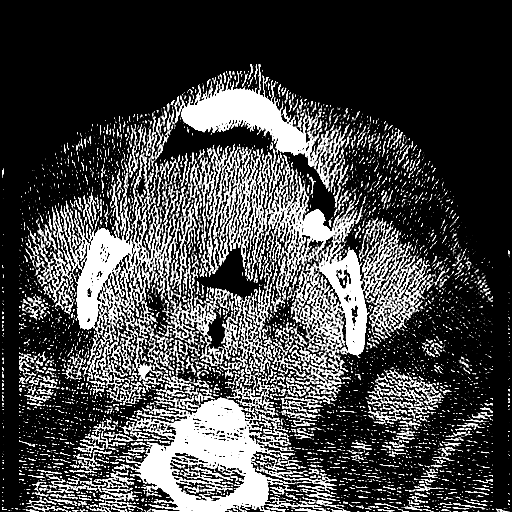
[im 39/77  brain]
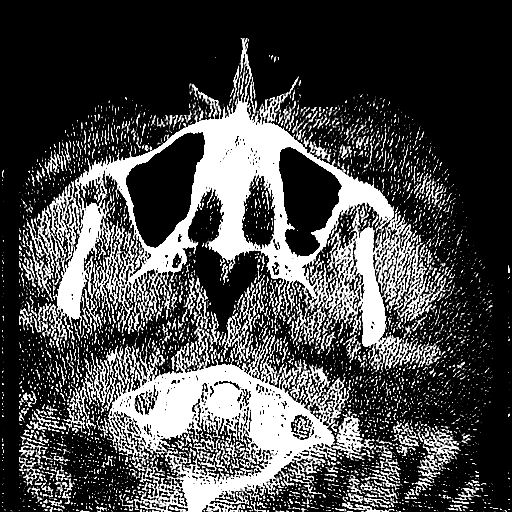
[im 48/77  brain]
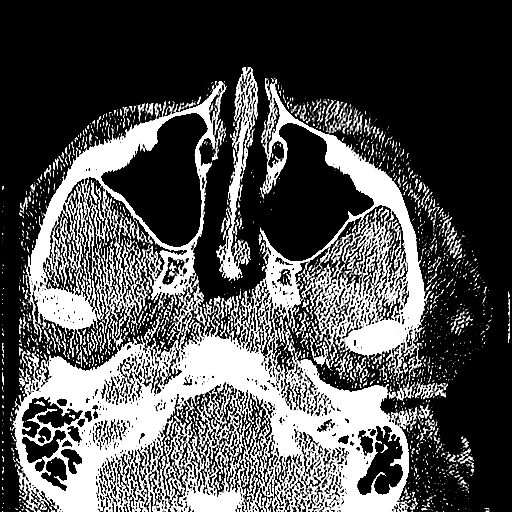
[im 58/77  brain]
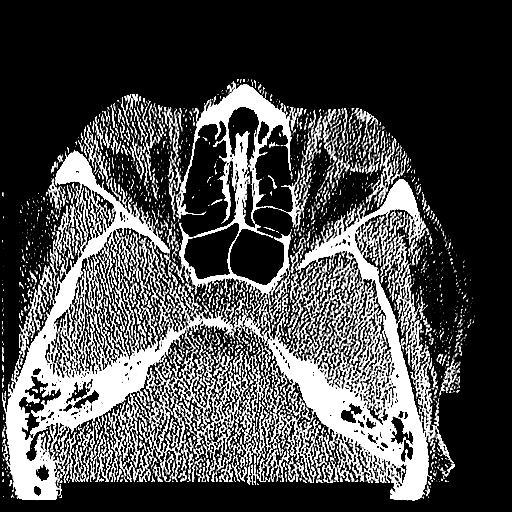
[im 67/77  brain]
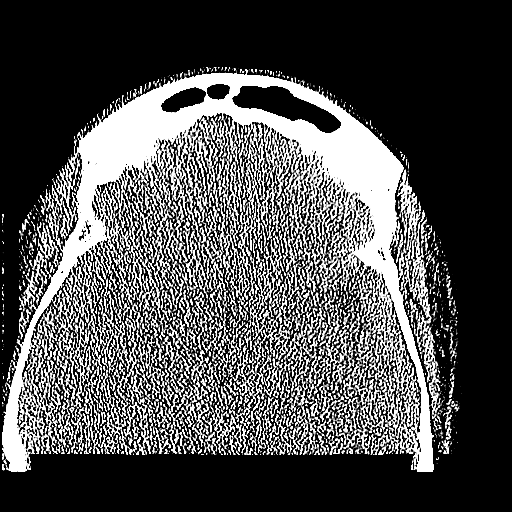

[Series 603: <mpr thick range(1)> · coronal · 0.31mm/px · 3 of 77 slices shown]
[im 26/77  brain]
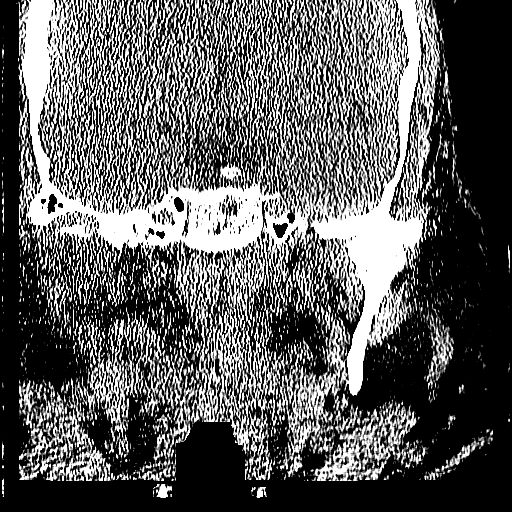
[im 34/77  brain]
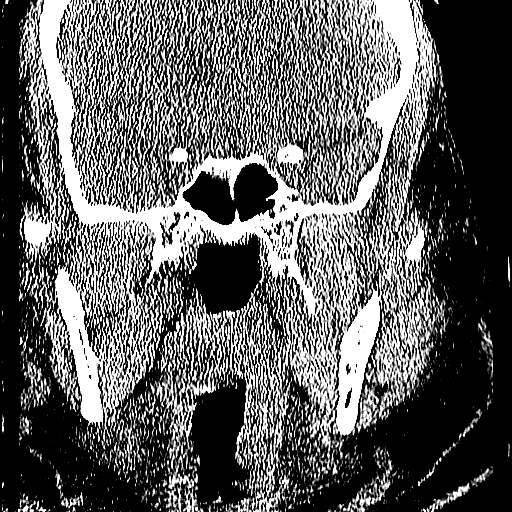
[im 43/77  brain]
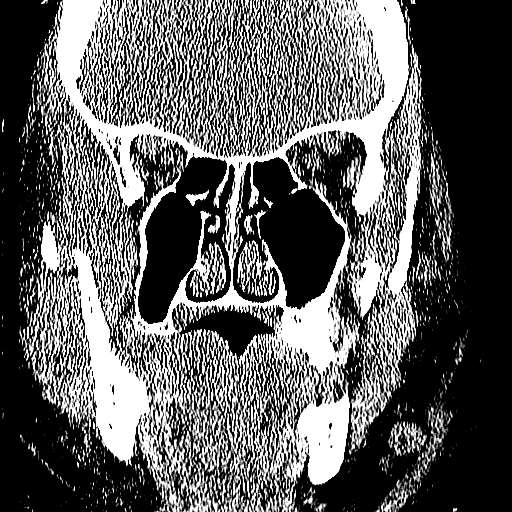

[17 of 40 positions shown; findings below may reference images not displayed]

FINDINGS: There is no evidence for acute infarction, intracranial
hemorrhage, mass lesion, hydrocephalus, or extra-axial fluid.
There is no atrophy or white matter disease.  Calvarium is intact.
Clear paranasal sinuses.  Minimal dependent fluid left mastoid not
clearly acute.
IMPRESSION: Negative exam.

CT MAXILLOFACIAL
FINDINGS: There is no visible facial fracture or acute sinus
opacity.  Significant bruising is seen in the malar region on the
left.  There are multiple teeth missing which is a chronic finding.
The mandibular condyles are slightly anteriorly displaced in an
open mouth type configuration of the TMJ, but I do not definitely
see a mandibular fracture or TMJ dislocation. Slight double density
of right TMJ appears to not represent a fracture but patient
movement.
IMPRESSION: No visible facial fracture or acute sinus opacity.  Left malar
hematoma.  Slight open mouth position of the mandible without
definite TMJ dislocation.  Findings discussed with ED provider.

## 2014-04-07 IMAGING — MR MR HEAD W/O CM
6 of 8 series · 29 of 48 positions shown · non-contrast
Comparison: CT 04/24/2011

CLINICAL DATA: Fall.  Headache and facial pain.  Rule out stroke

MRI HEAD WITHOUT CONTRAST
TECHNIQUE: Multiplanar, multiecho pulse sequences of the brain and
surrounding structures were obtained according to standard protocol
without intravenous contrast.

[Series 4: T1 · sagittal · 5.0mm · 0.47mm/px · 1 of 24 slices shown]
[im 1/24]
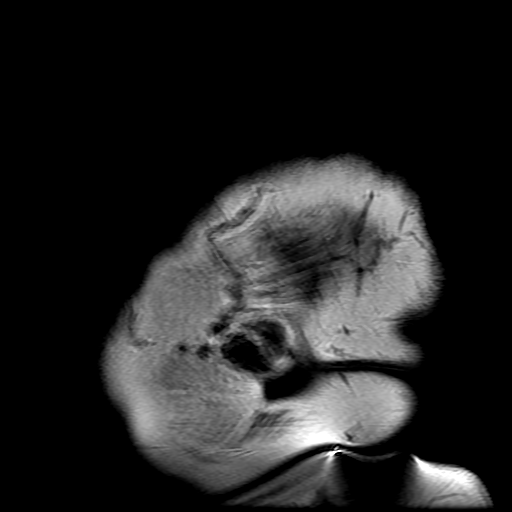

[Series 5: DWI · axial · 5.0mm · 1.09mm/px · z∈[-102,+41]mm · 8 of 60 slices shown (1 of 2)]
[im 1/60]
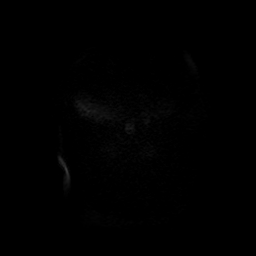
[im 7/60]
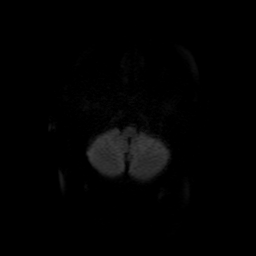
[im 20/60]
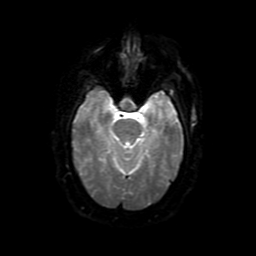
[im 27/60]
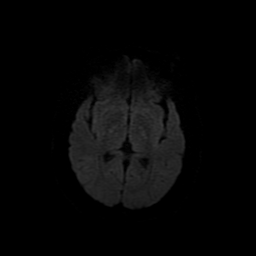
[im 33/60]
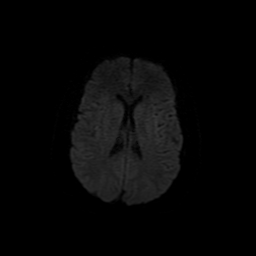
[im 40/60]
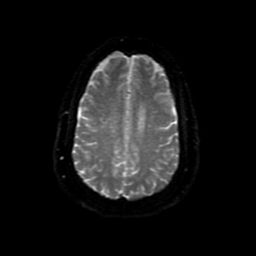
[im 53/60]
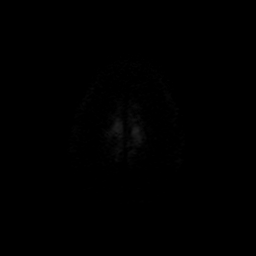
[im 60/60]
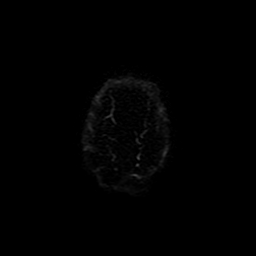

[Series 6: T2 · axial · 5.0mm · 0.43mm/px · z∈[-118,+47]mm · 5 of 29 slices shown (1 of 2)]
[im 1/29]
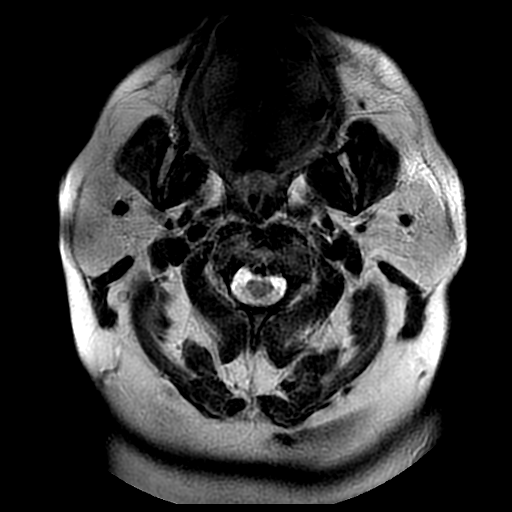
[im 8/29]
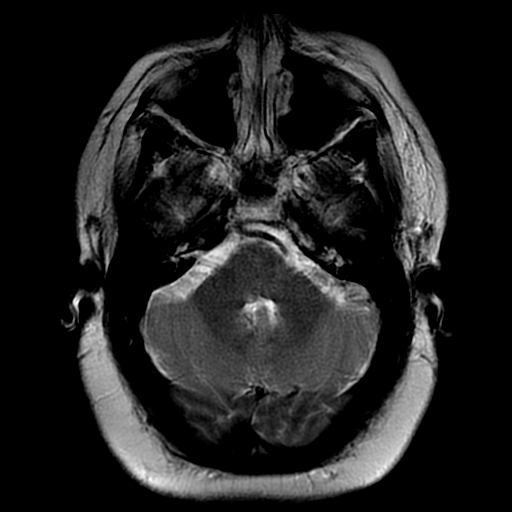
[im 15/29]
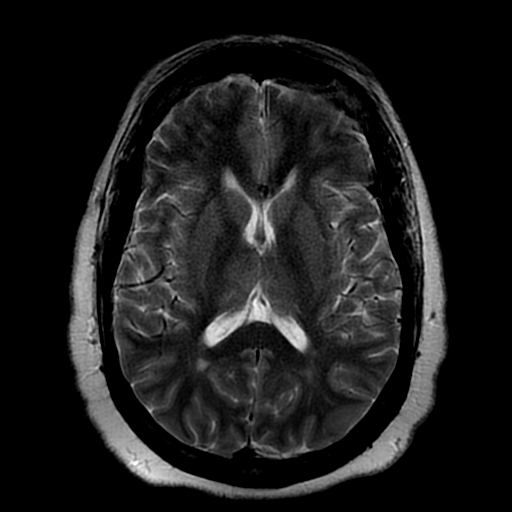
[im 22/29]
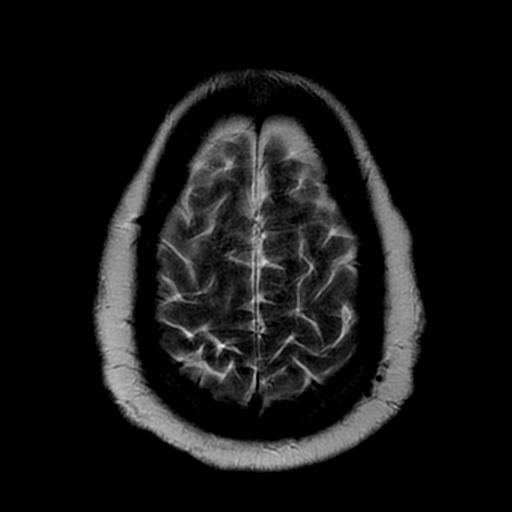
[im 29/29]
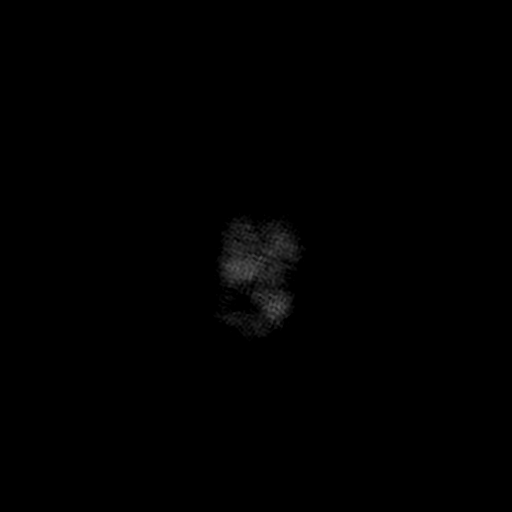

[Series 7: FLAIR · axial · 5.0mm · 0.43mm/px · z∈[-118,+47]mm · 5 of 29 slices shown]
[im 1/29]
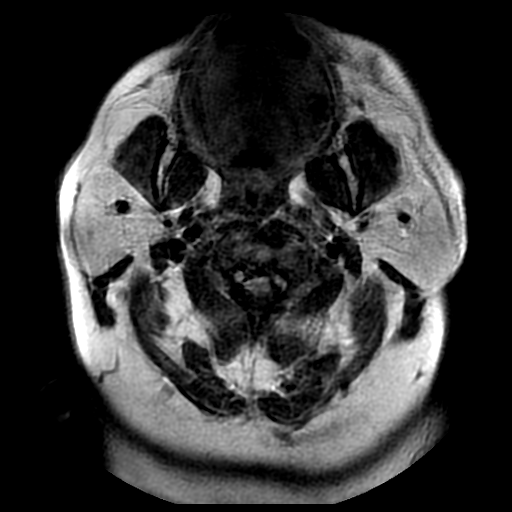
[im 8/29]
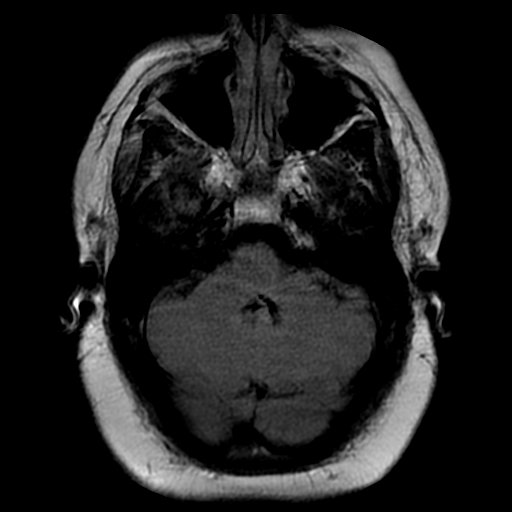
[im 15/29]
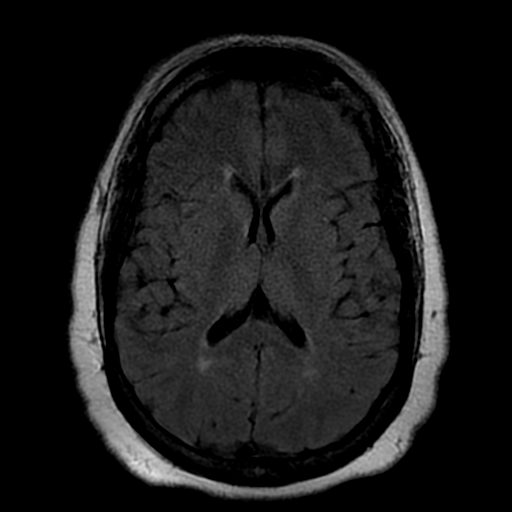
[im 22/29]
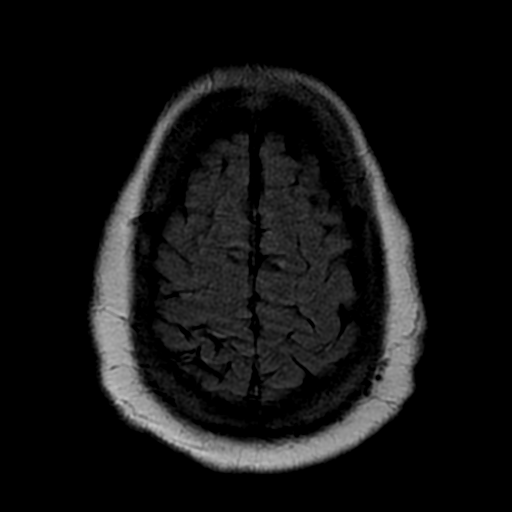
[im 29/29]
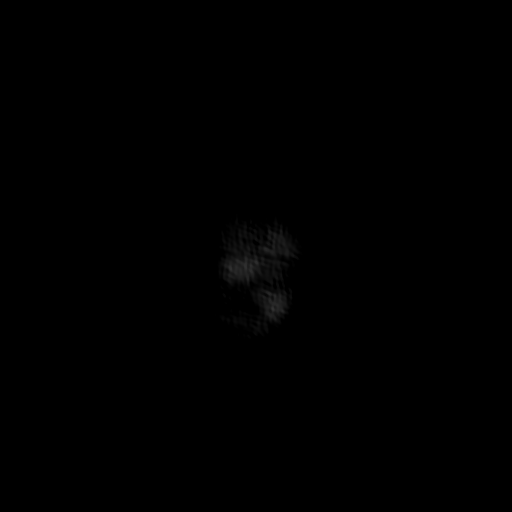

[Series 10: T2 · coronal · 5.0mm · 0.45mm/px · 5 of 30 slices shown (2 of 2)]
[im 1/30]
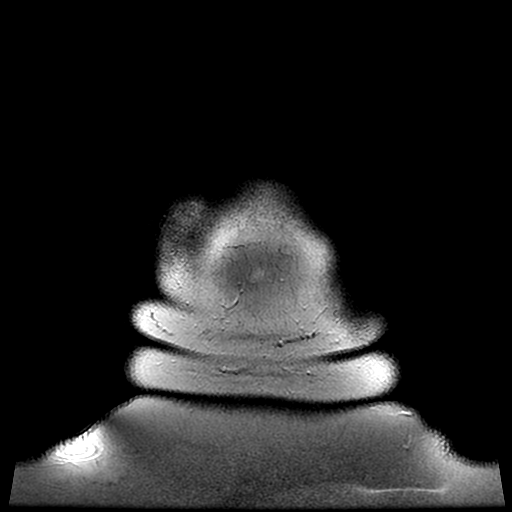
[im 8/30]
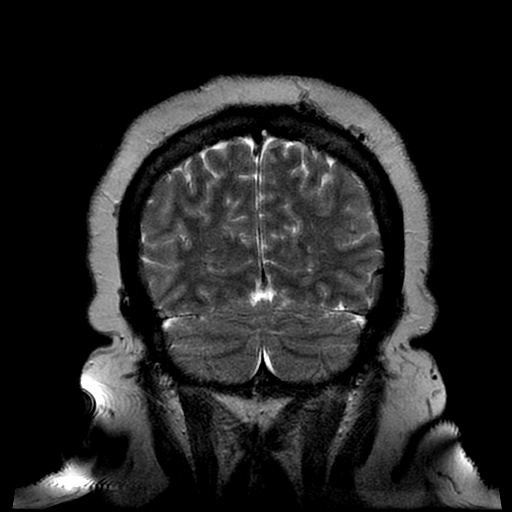
[im 15/30]
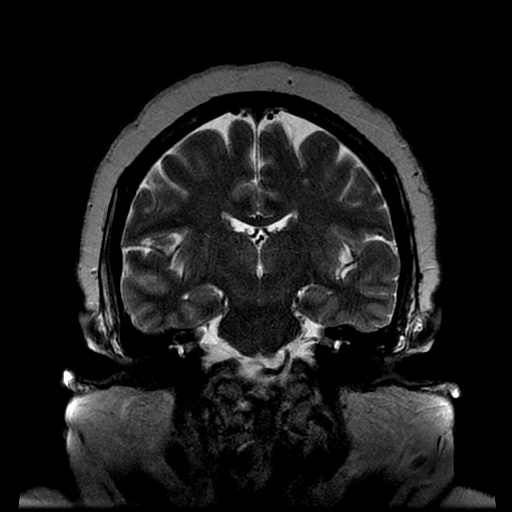
[im 22/30]
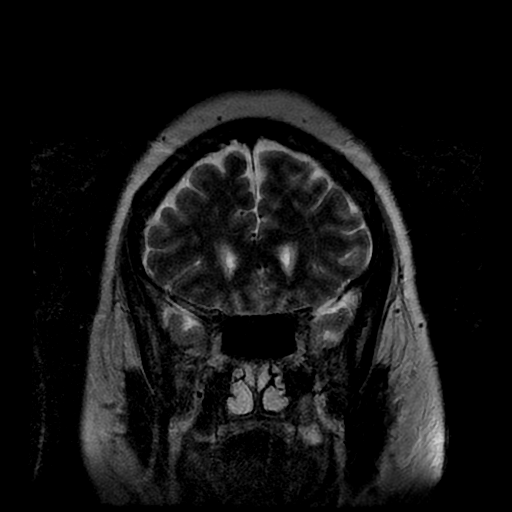
[im 30/30]
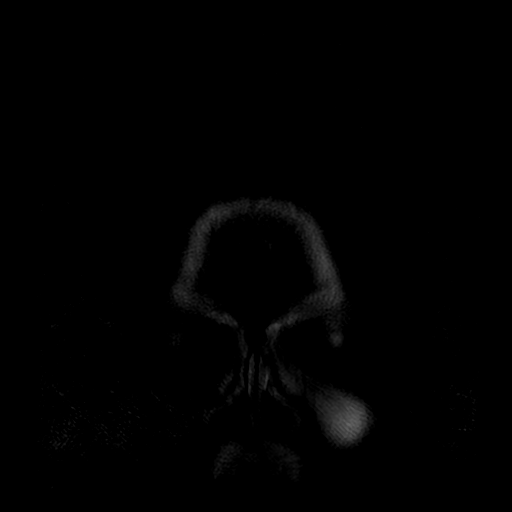

[Series 500: DWI · axial · 5.0mm · 1.09mm/px · z∈[-102,+41]mm · 5 of 30 slices shown (2 of 2)]
[im 1/30]
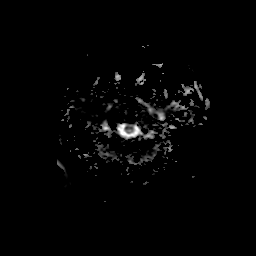
[im 8/30]
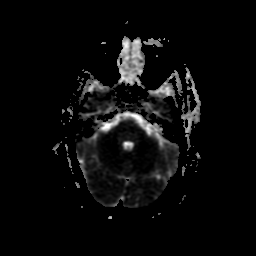
[im 15/30]
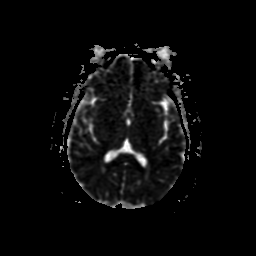
[im 22/30]
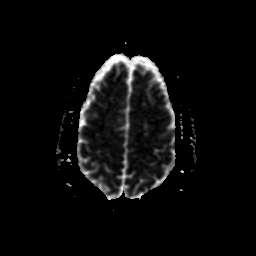
[im 30/30]
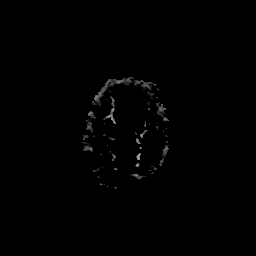

[29 of 48 positions shown; findings below may reference images not displayed]

FINDINGS: Image quality degraded by motion and large patient size.

Negative for acute infarct.  Several small white matter
hyperintensities bilaterally which may represent chronic ischemia.
Negative for hemorrhage or mass lesion.  No edema is present.
Negative for subdural fluid collection.

Paranasal sinuses are clear.
IMPRESSION: No acute abnormality.  Mild chronic white matter disease.

## 2014-04-27 ENCOUNTER — Ambulatory Visit: Payer: Medicare HMO | Admitting: Family Medicine

## 2014-05-04 ENCOUNTER — Ambulatory Visit: Payer: Medicare HMO | Admitting: Family Medicine

## 2014-05-05 ENCOUNTER — Ambulatory Visit: Payer: Medicare HMO | Admitting: Family Medicine

## 2014-05-21 ENCOUNTER — Telehealth: Payer: Self-pay | Admitting: *Deleted

## 2014-05-21 ENCOUNTER — Ambulatory Visit (INDEPENDENT_AMBULATORY_CARE_PROVIDER_SITE_OTHER): Payer: Commercial Managed Care - HMO | Admitting: Family Medicine

## 2014-05-21 ENCOUNTER — Encounter: Payer: Self-pay | Admitting: Family Medicine

## 2014-05-21 VITALS — BP 139/80 | HR 60 | Temp 98.1°F | Wt 250.0 lb

## 2014-05-21 DIAGNOSIS — F418 Other specified anxiety disorders: Secondary | ICD-10-CM

## 2014-05-21 DIAGNOSIS — R748 Abnormal levels of other serum enzymes: Secondary | ICD-10-CM

## 2014-05-21 DIAGNOSIS — R300 Dysuria: Secondary | ICD-10-CM | POA: Diagnosis not present

## 2014-05-21 DIAGNOSIS — E118 Type 2 diabetes mellitus with unspecified complications: Secondary | ICD-10-CM

## 2014-05-21 DIAGNOSIS — J069 Acute upper respiratory infection, unspecified: Secondary | ICD-10-CM

## 2014-05-21 DIAGNOSIS — R7989 Other specified abnormal findings of blood chemistry: Secondary | ICD-10-CM

## 2014-05-21 DIAGNOSIS — M25561 Pain in right knee: Secondary | ICD-10-CM

## 2014-05-21 DIAGNOSIS — M25562 Pain in left knee: Secondary | ICD-10-CM

## 2014-05-21 DIAGNOSIS — F319 Bipolar disorder, unspecified: Secondary | ICD-10-CM

## 2014-05-21 DIAGNOSIS — B370 Candidal stomatitis: Secondary | ICD-10-CM

## 2014-05-21 LAB — POCT URINALYSIS DIPSTICK
Bilirubin, UA: NEGATIVE
Blood, UA: NEGATIVE
Glucose, UA: NEGATIVE
Ketones, UA: NEGATIVE
Leukocytes, UA: NEGATIVE
Nitrite, UA: NEGATIVE
Protein, UA: NEGATIVE
Spec Grav, UA: 1.025
Urobilinogen, UA: 0.2
pH, UA: 6

## 2014-05-21 LAB — BASIC METABOLIC PANEL WITH GFR
BUN: 13 mg/dL (ref 6–23)
CO2: 26 mEq/L (ref 19–32)
Calcium: 9 mg/dL (ref 8.4–10.5)
Chloride: 108 mEq/L (ref 96–112)
Creat: 1.08 mg/dL (ref 0.50–1.10)
GFR, Est African American: 67 mL/min
GFR, Est Non African American: 58 mL/min — ABNORMAL LOW
Glucose, Bld: 83 mg/dL (ref 70–99)
Potassium: 4.4 mEq/L (ref 3.5–5.3)
Sodium: 142 mEq/L (ref 135–145)

## 2014-05-21 LAB — POCT GLYCOSYLATED HEMOGLOBIN (HGB A1C): Hemoglobin A1C: 5.5

## 2014-05-21 MED ORDER — LEVOFLOXACIN 500 MG PO TABS: 500.0000 mg | ORAL_TABLET | Freq: Every day | ORAL | Status: DC

## 2014-05-21 MED ORDER — NAPROXEN 500 MG PO TABS: 500.0000 mg | ORAL_TABLET | Freq: Three times a day (TID) | ORAL | Status: DC | PRN

## 2014-05-21 MED ORDER — NYSTATIN 100000 UNIT/ML MT SUSP: OROMUCOSAL | Status: DC

## 2014-05-21 NOTE — Telephone Encounter (Signed)
Called pharmacy to clarify. Rx was intentionally written for 500 mg TID PRN (max daily 1500), with intention being for TID use to be a rare as-needed max. Pt should call if she is using it that frequently more than a few days per month. Appreciate pharmacist clarification! --CMS

## 2014-05-21 NOTE — Telephone Encounter (Signed)
Received a call from [facility] regarding Rx for naproxen.  Max daily dosage is 1000 mg or BID.  Please give them a call to clarify dosage.  Derl Barrow, RN

## 2014-05-21 NOTE — Patient Instructions (Signed)
Thank you for coming in, today!  I will give you an antibiotic called Levaquin (levofloxacin) for your respiratory infection. You should take 1 pill a day for 7 days. This will most likely cover any urinary infection you might have. If I need you to do anything different, I'll call you to tell you.  We will work on getting you referred to a psychiatrist. Call your insurance to see what providers will be in-network for you. After that, call us to tell our referral coordinator Baraga who the insurance company will cover.  You can take naproxyn up to 3 times per day as needed for your pain. If you only need it 2 times per day, you can take it twice per day. I will call you in a new prescription for it.  I will also refill your nystatin for thrush.  I will call you or send you a letter with your kidney result.  Come back to see me as you need, otherwise. Please feel free to call with any questions or concerns at any time, at [phone]. --Dr. Venetia Maxon

## 2014-05-21 NOTE — Progress Notes (Signed)
Subjective:    Patient ID: Tiffany [name], female    DOB: 1960/09/21, 54 y.o.   MRN: [id number]  HPI: Pt presents to clinic for several complaints.  Congestion, cough: - reports 3 weeks of congestion, cough (productive of yellowish mucous) - denies fever but does have some SOB with it - reports little help with OTC medicines (antihistamines, cough syrups) - reports her mother had similar symptoms but not as bad  Concern for kidney function: - two weeks ago, pt had dark urine with odor and some burning - overall this has cleared up; she does occasionally have some odor, still - denies increased frequency / urge, burning, blood in urine - note pt takes oxybutynin for incontinence, which helps significantly  Psychiatry referral: - currently seeing Pauline Good at [facility], but now she will be out of network for [facility] - requests referral to a new provider - is seen for depression / anxiety and bipolar 1 disorder - reports trazodone at bedtime, Geodon, Xanax, and "one that starts with an E" - denies frank issues with hallucinations, SI / HI  Arthritis, knees and back:  - reports she has wide-spread arthritis and takes naproxen and tramadol - overall feels like naproxen works better than tramadol, and tramadol sometimes "sets off her seizures" - questions if naproxen twice per day, which works "okay" but questions if it could be increased  Chronic thrush: - worse with dry mouth due to psych meds - managed with nystatin and requests refill with "a larger bottle," if possible  Review of Systems: As above.      Objective:   Physical Exam BP 139/80 mmHg  Pulse 60  Temp(Src) 98.1 F (36.7 C) (Oral)  Wt 250 lb (113.399 kg) Gen: ill but non-toxic-appearing adult female in NAD HEENT: [place]/AT, sclerae/conjunctivae clear, no lid lag, EOMI, PERRLA   posterior oropharynx clear, no cervical lymphadenopathy  No active thrush noted but oral mucosae very dry Cardio: RRR, no murmur  appreciated; distal pulses intact/symmetric Pulm: scattered coarse breath sounds without wheezes  normal WOB but coarse cough with deep breathing especially for exam Abd: soft, nondistended, BS+, no HSM Ext: warm/well-perfused, no cyanosis/clubbing/edema MSK: bilateral knees normal to appearance             Right knee > left knee but bilateral crepitus with passive ROM while holding compression over patellae             Bilateral diffuse tenderness of knees, worst on right, prepatellar area Neuro/Psych: alert/oriented, sensation grossly intact; normal gait/balance  mood reported euthymic with congruent affect     Assessment & Plan:  1. Acute respiratory infection - likely viral but now progressive with more sputum production and some SOB, so possible bacterial superinfection - defer xray but favor empiric therapy with Levaquin 500 mg daily for 7 days - advised continued OTC meds for symptoms (cough syrup, mucolytic)  2. Dysuria - vague complaints but urine definitely with increased odor; UA otherwise unimpressive - will culture and f/u as appropriate; no further empiric abx as starting Levaquin as above - encouraged good fluid intake to help with any dehydration  3. Hx elevated serum Cr, context of reasonably-controlled DM and HTN mildly elevated with cough / discomfort  - pt with concern for urine problems as above but doubt this is related to any poor kidney function - A1c 5.5 today - will check BMP and f/u as appropriate - continue medications without changes  4. Bipolar 1 disorder / depression / anxiety -  stable on meds as Rx'd by Beverly Sessions - needs new provider and referral due to now having [employer] Medicare - continue current meds - referral placed today; pt to call after finding out from insurance which provider(s) will be covered, so referral can be completed  5. Chronic knee / back pain - mild, reasonably controlled with Aleve; some questionable reported increased seizures with  tramadol - continue Aleve and limit tramadol or stop completely if exacerbating seizures - new Rx given for Aleve with instructions to take BID as needed primarily, though may take as many as TID if absolutely needed  6. Chronic thrush - related to dry mouth from chronic  - not currently active, well-controlled with nystatin solution - refilled Rx  F/u as needed otherwise.  Emmaline Kluver, MD PGY-3, [facility] 05/21/2014, 4:21 PM

## 2014-05-22 ENCOUNTER — Encounter (HOSPITAL_COMMUNITY): Payer: Self-pay | Admitting: *Deleted

## 2014-05-22 LAB — URINE CULTURE: Colony Count: 6000

## 2014-05-25 ENCOUNTER — Encounter: Payer: Self-pay | Admitting: Family Medicine

## 2014-05-25 DIAGNOSIS — F319 Bipolar disorder, unspecified: Secondary | ICD-10-CM | POA: Diagnosis not present

## 2014-05-27 ENCOUNTER — Telehealth: Payer: Self-pay | Admitting: Family Medicine

## 2014-05-27 NOTE — Telephone Encounter (Signed)
Called pt back to discuss. No answer. Left message instructing pt to continue OTC medications for any persistent symptoms; expect this represents a post-viral syndrome (though unsure exactly what symptoms she is still having). Instructed her to come back to be evaluated by myself or someone else sooner if I am not available, if she does not feel better with current measures. --CMS

## 2014-05-27 NOTE — Telephone Encounter (Signed)
Pt called because she was seen on 05/21/14 and was given antibiotics to take. She just finished them and she is still sick. Please call patient to discuss what is her next step. jw

## 2014-06-26 ENCOUNTER — Encounter: Payer: Self-pay | Admitting: Family Medicine

## 2014-06-26 ENCOUNTER — Ambulatory Visit (INDEPENDENT_AMBULATORY_CARE_PROVIDER_SITE_OTHER): Payer: Commercial Managed Care - HMO | Admitting: Family Medicine

## 2014-06-26 VITALS — BP 158/93 | HR 71 | Temp 98.2°F | Ht 64.0 in | Wt 249.7 lb

## 2014-06-26 DIAGNOSIS — J441 Chronic obstructive pulmonary disease with (acute) exacerbation: Secondary | ICD-10-CM | POA: Diagnosis not present

## 2014-06-26 MED ORDER — PREDNISONE 50 MG PO TABS: 50.0000 mg | ORAL_TABLET | Freq: Every day | ORAL | Status: DC

## 2014-06-26 MED ORDER — TIOTROPIUM BROMIDE MONOHYDRATE 18 MCG IN CAPS: 18.0000 ug | ORAL_CAPSULE | Freq: Once | RESPIRATORY_TRACT | Status: DC

## 2014-06-26 NOTE — Patient Instructions (Signed)
Thank you for coming in, today!  I'm not sure 100% what's causing everything. I think it might be your COPD acting up. I want you to take prednisone 50 mg once a day for 5 days to cut down on inflammation in your lungs. Start taking a medicine called Spiriva (tiotropium), a capsule that you put into an inhaler and then breathe in.  Keep taking everything else without any changes. Prednisone might run your blood sugar high for a few days. I think your dry mouth is mostly due to oxybutynin. Drink fluid as best you can for your dry mouth.  Come back to see me as you need. Please feel free to call with any questions or concerns at any time, at [phone]. --Dr. Venetia Maxon

## 2014-06-26 NOTE — Progress Notes (Signed)
   Subjective:    Patient ID: Tiffany [name], female    DOB: 1960-10-07, 54 y.o.   MRN: [id number]  HPI: Pt presents to clinic for continued chest and nasal congestion for 1+ month as well as cough. [name] bothered by both equally. She states she feels like she needs to cough something up but nothing is produced when she coughs. She feels more short of breath when she lies down. She states the mucous in her nose "stays hard" and denies runny nose or itchy eyes. She states she takes an over-the-counter antihistamine (she is unsure of the name). She has tried dextromethorphan without any help but does not think she has tried Mucinex. She was treated with Levaquin about a month ago, which she does not think helped at all. She has not tried saline nasal spray or a Neti-Pot but has tried [facility] and something over the counter (she thinks maybe Afrin or similar product).  Review of Systems: As above. Denies fever, chills, abdominal pain, chest pain. Incidentally complains of significant dry mouth, chronic / unchanged (mentioned at end of visit; see A&P)     Objective:   Physical Exam BP 158/93 mmHg  Pulse 71  Temp(Src) 98.2 F (36.8 C) (Oral)  Ht 5\' 4"  (1.626 m)  Wt 249 lb 11.2 oz (113.263 kg)  BMI 42.84 kg/m2  SpO2 95% Gen: well-appearing adult female in NAD HEENT: [place]/AT, EOMI, PERRLA, MM markedly dry  TM's clear bilaterally  Nasal mucosae and posterior oropharynx mildly erythematous Neck: supple, normal ROM, no lymphadenopathy Cardio: RRR, no murmur appreciated Pulm: mild-moderate congestion appreciable mostly in upper airways (transmitted sounds) and upper lung fields  Lower lung fields with symmetric slightly diminished breath sounds  Mild increased WOB at baseline  No focal consolidations noted on auscultation Abd: soft, nontender, BS+ Ext: warm, well-perfused, no LE edema     Assessment & Plan:  54yo female with suspected possible COPD exacerbation (asthma on problem list, but  overall clinical picture more closely resembles COPD, ?chronic bronchitis type) - already on Symbicort, albuterol, Singulair, and allegra - no frank signs / symptoms to suggest infection, and no improvement with recent course of Levaquin - likely component of seasonal allergies, as well  Plan: - Rx for burst of prednisone (50 mg for 5 days) and new start of Spiriva - counseled on mechanisms of action of various medication and explained rationale behind multiple stacked therapies - continue Symbicort, albuterol, and Singulair - f/u otherwise as needed; may warrant referral to pulmonology in the future  Incidentally complained of dry mouth at end of visit - counseled that dry mouth is very likely due to her other medications with anticholinergic side effects (chiefly oxybutynin) - pt reports she "absolutely cannot" stop or cut back on oxybutynin due to severe urinary symptoms without it - advised f/u with urology (pt reports she has seen them before) to see if there are other options - suggested use of water, lozenges to help salivation, etc, as tolerated  Emmaline Kluver, MD PGY-3, [facility] 06/28/2014, 12:39 PM

## 2014-06-28 ENCOUNTER — Encounter: Payer: Self-pay | Admitting: Family Medicine

## 2014-07-07 ENCOUNTER — Encounter (HOSPITAL_COMMUNITY): Payer: Self-pay | Admitting: Nurse Practitioner

## 2014-07-07 ENCOUNTER — Emergency Department (HOSPITAL_COMMUNITY): Payer: Commercial Managed Care - HMO

## 2014-07-07 ENCOUNTER — Observation Stay (HOSPITAL_COMMUNITY)
Admission: EM | Admit: 2014-07-07 | Discharge: 2014-07-09 | Disposition: A | Discharge status: Home / Self Care | Payer: Commercial Managed Care - HMO | Attending: Family Medicine | Admitting: Family Medicine

## 2014-07-07 DIAGNOSIS — F319 Bipolar disorder, unspecified: Secondary | ICD-10-CM | POA: Diagnosis not present

## 2014-07-07 DIAGNOSIS — E785 Hyperlipidemia, unspecified: Secondary | ICD-10-CM | POA: Insufficient documentation

## 2014-07-07 DIAGNOSIS — Z6841 Body Mass Index (BMI) 40.0 and over, adult: Secondary | ICD-10-CM | POA: Diagnosis not present

## 2014-07-07 DIAGNOSIS — Z885 Allergy status to narcotic agent status: Secondary | ICD-10-CM | POA: Diagnosis not present

## 2014-07-07 DIAGNOSIS — R0981 Nasal congestion: Secondary | ICD-10-CM

## 2014-07-07 DIAGNOSIS — J449 Chronic obstructive pulmonary disease, unspecified: Secondary | ICD-10-CM | POA: Diagnosis present

## 2014-07-07 DIAGNOSIS — K219 Gastro-esophageal reflux disease without esophagitis: Secondary | ICD-10-CM | POA: Insufficient documentation

## 2014-07-07 DIAGNOSIS — F419 Anxiety disorder, unspecified: Secondary | ICD-10-CM | POA: Diagnosis not present

## 2014-07-07 DIAGNOSIS — R569 Unspecified convulsions: Secondary | ICD-10-CM | POA: Diagnosis not present

## 2014-07-07 DIAGNOSIS — I252 Old myocardial infarction: Secondary | ICD-10-CM | POA: Insufficient documentation

## 2014-07-07 DIAGNOSIS — M25561 Pain in right knee: Secondary | ICD-10-CM | POA: Diagnosis not present

## 2014-07-07 DIAGNOSIS — G894 Chronic pain syndrome: Secondary | ICD-10-CM | POA: Insufficient documentation

## 2014-07-07 DIAGNOSIS — R0789 Other chest pain: Secondary | ICD-10-CM | POA: Diagnosis not present

## 2014-07-07 DIAGNOSIS — J441 Chronic obstructive pulmonary disease with (acute) exacerbation: Secondary | ICD-10-CM | POA: Diagnosis present

## 2014-07-07 DIAGNOSIS — I5032 Chronic diastolic (congestive) heart failure: Secondary | ICD-10-CM | POA: Diagnosis not present

## 2014-07-07 DIAGNOSIS — Z87891 Personal history of nicotine dependence: Secondary | ICD-10-CM | POA: Insufficient documentation

## 2014-07-07 DIAGNOSIS — R0989 Other specified symptoms and signs involving the circulatory and respiratory systems: Secondary | ICD-10-CM | POA: Insufficient documentation

## 2014-07-07 DIAGNOSIS — G4733 Obstructive sleep apnea (adult) (pediatric): Secondary | ICD-10-CM | POA: Diagnosis not present

## 2014-07-07 DIAGNOSIS — Z882 Allergy status to sulfonamides status: Secondary | ICD-10-CM | POA: Insufficient documentation

## 2014-07-07 DIAGNOSIS — L821 Other seborrheic keratosis: Secondary | ICD-10-CM | POA: Insufficient documentation

## 2014-07-07 DIAGNOSIS — M25562 Pain in left knee: Secondary | ICD-10-CM | POA: Diagnosis not present

## 2014-07-07 DIAGNOSIS — Z888 Allergy status to other drugs, medicaments and biological substances status: Secondary | ICD-10-CM | POA: Insufficient documentation

## 2014-07-07 DIAGNOSIS — R05 Cough: Secondary | ICD-10-CM | POA: Diagnosis not present

## 2014-07-07 DIAGNOSIS — M62838 Other muscle spasm: Secondary | ICD-10-CM | POA: Insufficient documentation

## 2014-07-07 DIAGNOSIS — E118 Type 2 diabetes mellitus with unspecified complications: Secondary | ICD-10-CM

## 2014-07-07 DIAGNOSIS — J45909 Unspecified asthma, uncomplicated: Secondary | ICD-10-CM | POA: Diagnosis not present

## 2014-07-07 DIAGNOSIS — Z87442 Personal history of urinary calculi: Secondary | ICD-10-CM | POA: Diagnosis not present

## 2014-07-07 DIAGNOSIS — I517 Cardiomegaly: Secondary | ICD-10-CM | POA: Diagnosis not present

## 2014-07-07 DIAGNOSIS — I10 Essential (primary) hypertension: Secondary | ICD-10-CM | POA: Diagnosis not present

## 2014-07-07 DIAGNOSIS — E119 Type 2 diabetes mellitus without complications: Secondary | ICD-10-CM | POA: Diagnosis not present

## 2014-07-07 DIAGNOSIS — R079 Chest pain, unspecified: Secondary | ICD-10-CM | POA: Diagnosis not present

## 2014-07-07 LAB — CBC
HCT: 35.7 % — ABNORMAL LOW (ref 36.0–46.0)
Hemoglobin: 11.5 g/dL — ABNORMAL LOW (ref 12.0–15.0)
MCH: 29.1 pg (ref 26.0–34.0)
MCHC: 32.2 g/dL (ref 30.0–36.0)
MCV: 90.4 fL (ref 78.0–100.0)
Platelets: 339 10*3/uL (ref 150–400)
RBC: 3.95 MIL/uL (ref 3.87–5.11)
RDW: 15.2 % (ref 11.5–15.5)
WBC: 12.4 10*3/uL — ABNORMAL HIGH (ref 4.0–10.5)

## 2014-07-07 LAB — BASIC METABOLIC PANEL
Anion gap: 7 (ref 5–15)
BUN: 17 mg/dL (ref 6–20)
CO2: 28 mmol/L (ref 22–32)
Calcium: 8.6 mg/dL — ABNORMAL LOW (ref 8.9–10.3)
Chloride: 105 mmol/L (ref 101–111)
Creatinine, Ser: 1.09 mg/dL — ABNORMAL HIGH (ref 0.44–1.00)
GFR calc Af Amer: >60 mL/min (ref >60)
GFR calc non Af Amer: 56 mL/min — ABNORMAL LOW (ref >60)
Glucose, Bld: 85 mg/dL (ref 65–99)
Potassium: 3.5 mmol/L (ref 3.5–5.1)
Sodium: 140 mmol/L (ref 135–145)

## 2014-07-07 LAB — I-STAT TROPONIN, ED: Troponin i, poc: 0.01 ng/mL (ref 0.00–0.08)

## 2014-07-07 LAB — BRAIN NATRIURETIC PEPTIDE: B Natriuretic Peptide: 24.4 pg/mL (ref 0.0–100.0)

## 2014-07-07 MED ORDER — IPRATROPIUM-ALBUTEROL 0.5-2.5 (3) MG/3ML IN SOLN
3.0000 mL | Freq: Once | RESPIRATORY_TRACT | Status: AC
  Administered 2014-07-07: 3 mL via RESPIRATORY_TRACT
  Filled 2014-07-07: qty 3

## 2014-07-07 MED ORDER — OXYMETAZOLINE HCL 0.05 % NA SOLN
1.0000 | Freq: Once | NASAL | Status: AC
  Administered 2014-07-07: 1 via NASAL
  Filled 2014-07-07: qty 15

## 2014-07-07 MED ORDER — IPRATROPIUM BROMIDE 0.02 % IN SOLN
0.5000 mg | Freq: Once | RESPIRATORY_TRACT | Status: AC
  Administered 2014-07-07: 0.5 mg via RESPIRATORY_TRACT
  Filled 2014-07-07: qty 2.5

## 2014-07-07 MED ORDER — ALBUTEROL SULFATE (2.5 MG/3ML) 0.083% IN NEBU
5.0000 mg | INHALATION_SOLUTION | Freq: Once | RESPIRATORY_TRACT | Status: AC
  Administered 2014-07-07: 5 mg via RESPIRATORY_TRACT
  Filled 2014-07-07: qty 6

## 2014-07-07 MED ORDER — ALBUTEROL (5 MG/ML) CONTINUOUS INHALATION SOLN
10.0000 mg/h | INHALATION_SOLUTION | RESPIRATORY_TRACT | Status: DC
  Administered 2014-07-07: 10 mg/h via RESPIRATORY_TRACT
  Filled 2014-07-07: qty 20

## 2014-07-07 MED ORDER — PREDNISONE 20 MG PO TABS
40.0000 mg | ORAL_TABLET | Freq: Once | ORAL | Status: AC
  Administered 2014-07-07: 40 mg via ORAL
  Filled 2014-07-07: qty 2

## 2014-07-07 MED ORDER — OXYMETAZOLINE HCL 0.05 % NA SOLN: 2.0000 | Freq: Once | NASAL | Status: DC

## 2014-07-07 NOTE — ED Notes (Signed)
MD at bedside. 

## 2014-07-07 NOTE — ED Notes (Signed)
Pt reports chest rattling x1 month. Pt was prescribed antibiotics and steroids with no relief.

## 2014-07-07 NOTE — ED Notes (Signed)
Complained of being light headed before ambulating. Continued while ambulating. Complained of shortness of breath while ambulating. Gait was normal, no pain. SPO2 90. Pulse 98.

## 2014-07-07 NOTE — ED Provider Notes (Signed)
CSN: [id number]     Arrival date & time 07/07/14  1729 History   First MD Initiated Contact with Patient 07/07/14 1758     Chief Complaint  Patient presents with  . Cough     (Consider location/radiation/quality/duration/timing/severity/associated sxs/prior Treatment) Patient is a 54 y.o. female presenting with URI.  URI Presenting symptoms: congestion, cough and rhinorrhea   Presenting symptoms: no ear pain, no fatigue, no fever and no sore throat   Severity:  Moderate Onset quality:  Gradual Duration:  3 months Timing:  Constant Progression:  Waxing and waning Chronicity:  New Relieved by:  Nothing Exacerbated by: lying down. Ineffective treatments:  OTC medications Associated symptoms: no arthralgias, no headaches, no neck pain, no sinus pain, no sneezing, no swollen glands and no wheezing   Risk factors: being elderly, diabetes mellitus and sick contacts     Past Medical History  Diagnosis Date  . High blood pressure   . Incontinence   . Anxiety   . Manic depression   . Heart attack   . Diabetes mellitus     class 2  . Asthma   . Emphysema   . High cholesterol   . Stroke   . Sinus trouble   . Seasonal allergies   . Sleep apnea   . Disorder of nervous system   . Myocardial infarct   . Complication of anesthesia     decreased bp, decreased heart rate   Past Surgical History  Procedure Laterality Date  . Abdominal hysterectomy    . Cesarean section    . Hiatal hernia repair     Family History  Problem Relation Age of Onset  . Cancer Father     prostate  . Heart failure      cousin  . Cancer Mother     lung   History  Substance Use Topics  . Smoking status: Former Smoker    Types: Cigarettes    Quit date: 04/23/1977  . Smokeless tobacco: Never Used  . Alcohol Use: No   OB History    No data available     Review of Systems  Constitutional: Negative for fever, chills, appetite change and fatigue.  HENT: Positive for congestion and rhinorrhea.  Negative for ear pain, facial swelling, mouth sores, sneezing and sore throat.   Eyes: Negative for visual disturbance.  Respiratory: Positive for cough and chest tightness. Negative for shortness of breath and wheezing.   Cardiovascular: Negative for chest pain and palpitations.  Gastrointestinal: Negative for nausea, vomiting, abdominal pain, diarrhea and blood in stool.  Endocrine: Negative for cold intolerance and heat intolerance.  Genitourinary: Negative for frequency, decreased urine volume and difficulty urinating.  Musculoskeletal: Negative for back pain, arthralgias, neck pain and neck stiffness.  Skin: Negative for rash.  Neurological: Negative for dizziness, weakness, light-headedness and headaches.  All other systems reviewed and are negative.     Allergies  Codeine; Hydroxyzine; and Sulfa antibiotics  Home Medications   Prior to Admission medications   Medication Sig Start Date End Date Taking? Authorizing Provider  albuterol (PROVENTIL HFA;VENTOLIN HFA) 108 (90 BASE) MCG/ACT inhaler Inhale 2 puffs into the lungs every 6 (six) hours as needed. 12/31/13  Yes Sharon Mt Street, MD  albuterol (PROVENTIL) (2.5 MG/3ML) 0.083% nebulizer solution INHALE CONTENTS OF 1 VIAL VIA NEBULIZER EVERY 6  HOURS AS NEEDED FOR SHORTNESS OF BREATH. 07/15/13  Yes Sharon Mt Street, MD  atorvastatin (LIPITOR) 40 MG tablet Take 1 tablet (40 mg total) by mouth daily.  03/27/14  Yes Sharon Mt Street, MD  budesonide-formoterol [facility]) 160-4.5 MCG/ACT inhaler Inhale 2 puffs into the lungs 2 (two) times daily. 12/31/13  Yes Sharon Mt Street, MD  cloNIDine (CATAPRES) 0.2 MG tablet Take 1 tablet (0.2 mg total) by mouth 3 (three) times daily. 03/27/14  Yes Sharon Mt Street, MD  cyclobenzaprine (FLEXERIL) 10 MG tablet Take 1 tablet (10 mg total) by mouth 3 (three) times daily as needed for muscle spasms. 07/15/13  Yes Sharon Mt Street, MD  fexofenadine (ALLEGRA) 60 MG tablet Take 2  tablets (120 mg total) by mouth 2 (two) times daily. 12/31/13  Yes Sharon Mt Street, MD  fluticasone [facility]) 50 MCG/ACT nasal spray Place 2 sprays into both nostrils daily. 12/31/13  Yes Sharon Mt Street, MD  furosemide (LASIX) 40 MG tablet Take 1 tablet (40 mg total) by mouth daily. 12/31/13  Yes Sharon Mt Street, MD  gabapentin (NEURONTIN) 600 MG tablet Take 1 tablet (600 mg total) by mouth 3 (three) times daily. 03/27/14  Yes Sharon Mt Street, MD  HYDROcodone-acetaminophen (NORCO/VICODIN) 5-325 MG per tablet Take 1 tablet by mouth every 6 (six) hours as needed for moderate pain or severe pain. 12/31/13  Yes Sharon Mt Street, MD  metFORMIN (GLUMETZA) 1000 MG (MOD) 24 hr tablet Take 1 tablet (1,000 mg total) by mouth daily with breakfast. 03/27/14  Yes Sharon Mt Street, MD  montelukast (SINGULAIR) 10 MG tablet Take 1 tablet (10 mg total) by mouth at bedtime. 12/31/13  Yes Sharon Mt Street, MD  naproxen (NAPROSYN) 500 MG tablet Take 1 tablet (500 mg total) by mouth 3 (three) times daily as needed. 05/21/14  Yes Sharon Mt Street, MD  nystatin (MYCOSTATIN) 100000 UNIT/ML suspension TAKE 5 MLS (500,000 UNITS TOTAL) BY MOUTH 4 (FOUR) TIMES DAILY. 05/21/14  Yes Sharon Mt Street, MD  omeprazole (PRILOSEC) 40 MG capsule Take 1 capsule (40 mg total) by mouth daily. 03/27/14  Yes Sharon Mt Street, MD  oxybutynin (DITROPAN) 5 MG tablet Take 1 tablet (5 mg total) by mouth 3 (three) times daily. 03/27/14  Yes Sharon Mt Street, MD  potassium chloride (K-DUR) 10 MEQ tablet Take 2 tablets (20 mEq total) by mouth daily. 03/27/14  Yes Sharon Mt Street, MD  predniSONE (DELTASONE) 50 MG tablet Take 1 tablet (50 mg total) by mouth daily with breakfast. 06/26/14  Yes Sharon Mt Street, MD  ranitidine (ZANTAC) 150 MG tablet Take 1 tablet (150 mg total) by mouth 2 (two) times daily. 01/05/14  Yes [place], MD  tiotropium (SPIRIVA HANDIHALER) 18 MCG inhalation capsule  Place 1 capsule (18 mcg total) into inhaler and inhale once. Patient taking differently: Place 18 mcg into inhaler and inhale daily.  06/26/14  Yes Sharon Mt Street, MD  traZODone (DESYREL) 150 MG tablet Take 1 tablet (150 mg total) by mouth at bedtime. 12/31/13  Yes Sharon Mt Street, MD  verapamil (VERELAN PM) 360 MG 24 hr capsule Take 1 capsule (360 mg total) by mouth at bedtime. 12/31/13  Yes Sharon Mt Street, MD  ziprasidone (GEODON) 80 MG capsule Take 1 capsule (80 mg total) by mouth 2 (two) times daily with a meal. 12/31/13  Yes Sharon Mt Street, MD  ALPRAZolam Duanne Moron) 1 MG tablet Take 1 tablet (1 mg total) by mouth at bedtime as needed for anxiety. Patient not taking: Reported on 07/07/2014 12/31/13   Sharon Mt Street, MD  traMADol (ULTRAM) 50 MG tablet Take 1-2 tablets (50-100 mg total) by mouth every 12 (twelve) hours as needed.  Filled via fax, [place], 09/29/13. Patient not taking: Reported on 07/07/2014 03/27/14   Sharon Mt Street, MD   BP 153/93 mmHg  Pulse 83  Temp(Src) 98.5 F (36.9 C) (Oral)  Resp 16  Ht 5\' 4"  (1.626 m)  Wt 249 lb (112.946 kg)  BMI 42.72 kg/m2  SpO2 99% Physical Exam  Constitutional: [name] oriented to person, place, and time. She appears well-developed and well-nourished. No distress.  HENT:  Head: Normocephalic and atraumatic.  Right Ear: External ear normal.  Left Ear: External ear normal.  Nose: Nose normal.  Eyes: Conjunctivae and EOM are normal. Pupils are equal, round, and reactive to light. Right eye exhibits no discharge. Left eye exhibits no discharge. No scleral icterus.  Neck: Normal range of motion. Neck supple.  Cardiovascular: Normal rate, regular rhythm and normal heart sounds.  Exam reveals no gallop and no friction rub.   No murmur heard. Pulmonary/Chest: Effort normal and breath sounds normal. No stridor. No respiratory distress. She has no wheezes.  Abdominal: Soft. She exhibits no distension. There is no  tenderness.  Musculoskeletal: She exhibits no edema or tenderness.  Neurological: [name] alert and oriented to person, place, and time.  Skin: Skin is warm and dry. No rash noted. [name] not diaphoretic. No erythema.  Psychiatric: She has a normal mood and affect.    ED Course  Procedures (including critical care time) Labs Review Labs Reviewed  BASIC METABOLIC PANEL - Abnormal; Notable for the following:    Creatinine, Ser 1.09 (*)    Calcium 8.6 (*)    GFR calc non Af Amer 56 (*)    All other components within normal limits  CBC - Abnormal; Notable for the following:    WBC 12.4 (*)    Hemoglobin 11.5 (*)    HCT 35.7 (*)    All other components within normal limits  BRAIN NATRIURETIC PEPTIDE  I-STAT TROPOININ, ED    Imaging Review Dg Chest 2 View  07/07/2014   CLINICAL DATA:  Cough.  Chest pain for 2 months.  EXAM: CHEST  2 VIEW  COMPARISON:  01/03/2013.  FINDINGS: Stable enlargement of the cardiopericardial silhouette. No failure. No airspace disease. No effusion. Suboptimal evaluation of the anterior mediastinum because the arms are not raised over head. Thoracic spondylosis. Exam degraded by obese body habitus.  IMPRESSION: Stable cardiomegaly without failure.   Electronically Signed   By: Dereck Ligas M.D.   On: 07/07/2014 18:05     EKG Interpretation   Date/Time:  Tuesday July 07 2014 19:04:42 EDT Ventricular Rate:  52 PR Interval:  160 QRS Duration: 76 QT Interval:  478 QTC Calculation: 444 R Axis:   62 Text Interpretation:  Sinus rhythm Low voltage, precordial leads artifact  noted No significant change since last tracing Confirmed by Christy Gentles  MD,  Elenore Rota ([id number]) on 07/07/2014 7:07:19 PM      MDM   54 year old female with a history of hypertension, diabetes, OSA on CPAP, COPD who presents to the ED with 2 months of nasal congestion and "rattling in her chest." Patient has been seen by her PCP thought to have a URI. She returned when symptoms did not improve  and was placed on Levaquin for possible pneumonia without chest x-ray being obtained. His symptoms are not improved that she presents today for evaluation. History and exam as above. Exam is consistent with rhinitis likely secondary to allergies and wheezing. Chest PT was performed. Chest congestion was cleared. The patient did have  wheezing and was given a breathing treatment and steroids for possible COPD exacerbation. Chest x-ray obtained revealed no evidence of pneumonia or pulmonary edema. EKG with normal sinus rhythm, intervals, axis. No evidence of acute ischemia, arrhythmia, or blocks. Labs unremarkable.  On reassessment lung sounds and wheezing improved. Patient was ambulated and she complained of shortness of breath with ambulation and noted to have sats in the low 90s. Additional breathing treatment given. Following the treatment patient is refusing to ambulate. Family medicine consult and likely admission.  Pt care transferred to Dr. Sharol Given. Please see her note for further course details.  Patient seen in conjunction with Dr. Christy Gentles.  Final diagnoses:  COPD exacerbation  Nasal congestion        Addison Lank, MD 07/08/14 0110  Ripley Fraise, MD 07/08/14 1730

## 2014-07-07 NOTE — ED Notes (Signed)
seh c/o "a rattling in my chest for months, its worse when i lay down and cough." she c/o nasal congestion. She also states her chest has felt tight "for months." she reports shes been to her PCP multiple times for these complaints and her doctor has no given her a diagnosis. [name] A&ox4, resp e/u

## 2014-07-07 NOTE — ED Provider Notes (Signed)
EKG Interpretation  Date/Time:  Tuesday July 07 2014 17:38:37 EDT Ventricular Rate:  69 PR Interval:  142 QRS Duration: 70 QT Interval:  410 QTC Calculation: 439 R Axis:   58 Text Interpretation:  Normal sinus rhythm Cannot rule out Anterior infarct , age undetermined Abnormal ECG No significant change since last tracing Confirmed by Christy Gentles  MD, Shatonya Passon ([id number]) on 07/07/2014 7:04:16 PM        Ripley Fraise, MD 07/07/14 1906

## 2014-07-08 ENCOUNTER — Encounter (HOSPITAL_COMMUNITY): Payer: Self-pay | Admitting: *Deleted

## 2014-07-08 ENCOUNTER — Observation Stay (HOSPITAL_BASED_OUTPATIENT_CLINIC_OR_DEPARTMENT_OTHER): Payer: Commercial Managed Care - HMO

## 2014-07-08 DIAGNOSIS — G894 Chronic pain syndrome: Secondary | ICD-10-CM | POA: Diagnosis not present

## 2014-07-08 DIAGNOSIS — E119 Type 2 diabetes mellitus without complications: Secondary | ICD-10-CM | POA: Diagnosis not present

## 2014-07-08 DIAGNOSIS — I252 Old myocardial infarction: Secondary | ICD-10-CM | POA: Diagnosis not present

## 2014-07-08 DIAGNOSIS — E785 Hyperlipidemia, unspecified: Secondary | ICD-10-CM | POA: Diagnosis not present

## 2014-07-08 DIAGNOSIS — J441 Chronic obstructive pulmonary disease with (acute) exacerbation: Secondary | ICD-10-CM

## 2014-07-08 DIAGNOSIS — J449 Chronic obstructive pulmonary disease, unspecified: Secondary | ICD-10-CM

## 2014-07-08 DIAGNOSIS — I517 Cardiomegaly: Secondary | ICD-10-CM | POA: Diagnosis not present

## 2014-07-08 DIAGNOSIS — I10 Essential (primary) hypertension: Secondary | ICD-10-CM | POA: Diagnosis not present

## 2014-07-08 DIAGNOSIS — R0600 Dyspnea, unspecified: Secondary | ICD-10-CM

## 2014-07-08 DIAGNOSIS — G4733 Obstructive sleep apnea (adult) (pediatric): Secondary | ICD-10-CM | POA: Diagnosis not present

## 2014-07-08 DIAGNOSIS — F419 Anxiety disorder, unspecified: Secondary | ICD-10-CM | POA: Diagnosis not present

## 2014-07-08 HISTORY — DX: Chronic obstructive pulmonary disease, unspecified: J44.9

## 2014-07-08 LAB — BASIC METABOLIC PANEL
Anion gap: 12 (ref 5–15)
BUN: 14 mg/dL (ref 6–20)
CO2: 24 mmol/L (ref 22–32)
Calcium: 8.7 mg/dL — ABNORMAL LOW (ref 8.9–10.3)
Chloride: 104 mmol/L (ref 101–111)
Creatinine, Ser: 1.17 mg/dL — ABNORMAL HIGH (ref 0.44–1.00)
GFR calc Af Amer: 60 mL/min — ABNORMAL LOW (ref >60)
GFR calc non Af Amer: 51 mL/min — ABNORMAL LOW (ref >60)
Glucose, Bld: 211 mg/dL — ABNORMAL HIGH (ref 65–99)
Potassium: 3.4 mmol/L — ABNORMAL LOW (ref 3.5–5.1)
Sodium: 140 mmol/L (ref 135–145)

## 2014-07-08 LAB — CBC WITH DIFFERENTIAL/PLATELET
Basophils Absolute: 0 10*3/uL (ref 0.0–0.1)
Basophils Relative: 0 % (ref 0–1)
Eosinophils Absolute: 0 10*3/uL (ref 0.0–0.7)
Eosinophils Relative: 0 % (ref 0–5)
HCT: 35.6 % — ABNORMAL LOW (ref 36.0–46.0)
Hemoglobin: 11.1 g/dL — ABNORMAL LOW (ref 12.0–15.0)
Lymphocytes Relative: 6 % — ABNORMAL LOW (ref 12–46)
Lymphs Abs: 1 10*3/uL (ref 0.7–4.0)
MCH: 28.5 pg (ref 26.0–34.0)
MCHC: 31.2 g/dL (ref 30.0–36.0)
MCV: 91.5 fL (ref 78.0–100.0)
Monocytes Absolute: 0.1 10*3/uL (ref 0.1–1.0)
Monocytes Relative: 1 % — ABNORMAL LOW (ref 3–12)
Neutro Abs: 14.9 10*3/uL — ABNORMAL HIGH (ref 1.7–7.7)
Neutrophils Relative %: 93 % — ABNORMAL HIGH (ref 43–77)
Platelets: 345 10*3/uL (ref 150–400)
RBC: 3.89 MIL/uL (ref 3.87–5.11)
RDW: 15.5 % (ref 11.5–15.5)
WBC: 16 10*3/uL — ABNORMAL HIGH (ref 4.0–10.5)

## 2014-07-08 LAB — GLUCOSE, CAPILLARY
Glucose-Capillary: 123 mg/dL — ABNORMAL HIGH (ref 65–99)
Glucose-Capillary: 134 mg/dL — ABNORMAL HIGH (ref 65–99)
Glucose-Capillary: 124 mg/dL — ABNORMAL HIGH (ref 65–99)

## 2014-07-08 LAB — TROPONIN I
Troponin I: <0.03 ng/mL (ref <0.031)
Troponin I: <0.03 ng/mL (ref <0.031)

## 2014-07-08 MED ORDER — POLYETHYLENE GLYCOL 3350 17 G PO PACK: 17.0000 g | PACK | Freq: Every day | ORAL | Status: DC | PRN

## 2014-07-08 MED ORDER — PREDNISONE 50 MG PO TABS
50.0000 mg | ORAL_TABLET | Freq: Every day | ORAL | Status: DC
  Administered 2014-07-08 – 2014-07-09 (×2): 50 mg via ORAL
  Filled 2014-07-08 (×2): qty 1

## 2014-07-08 MED ORDER — CYCLOBENZAPRINE HCL 10 MG PO TABS: 10.0000 mg | ORAL_TABLET | Freq: Three times a day (TID) | ORAL | Status: DC | PRN

## 2014-07-08 MED ORDER — SODIUM CHLORIDE 0.9 % IV SOLN: 250.0000 mL | INTRAVENOUS | Status: DC | PRN

## 2014-07-08 MED ORDER — ONDANSETRON HCL 4 MG PO TABS: 4.0000 mg | ORAL_TABLET | Freq: Four times a day (QID) | ORAL | Status: DC | PRN

## 2014-07-08 MED ORDER — ATORVASTATIN CALCIUM 40 MG PO TABS
40.0000 mg | ORAL_TABLET | Freq: Every day | ORAL | Status: DC
  Administered 2014-07-08 – 2014-07-09 (×2): 40 mg via ORAL
  Filled 2014-07-08 (×2): qty 1

## 2014-07-08 MED ORDER — ZIPRASIDONE HCL 80 MG PO CAPS
80.0000 mg | ORAL_CAPSULE | Freq: Two times a day (BID) | ORAL | Status: DC
  Administered 2014-07-08 – 2014-07-09 (×3): 80 mg via ORAL
  Filled 2014-07-08 (×6): qty 1

## 2014-07-08 MED ORDER — ONDANSETRON HCL 4 MG/2ML IJ SOLN: 4.0000 mg | Freq: Four times a day (QID) | INTRAMUSCULAR | Status: DC | PRN

## 2014-07-08 MED ORDER — PANTOPRAZOLE SODIUM 40 MG PO TBEC
40.0000 mg | DELAYED_RELEASE_TABLET | Freq: Every day | ORAL | Status: DC
  Administered 2014-07-08 – 2014-07-09 (×2): 40 mg via ORAL
  Filled 2014-07-08 (×2): qty 1

## 2014-07-08 MED ORDER — FAMOTIDINE 20 MG PO TABS
20.0000 mg | ORAL_TABLET | Freq: Every day | ORAL | Status: DC
  Administered 2014-07-08 – 2014-07-09 (×2): 20 mg via ORAL
  Filled 2014-07-08 (×2): qty 1

## 2014-07-08 MED ORDER — VERAPAMIL HCL ER 180 MG PO TBCR: 360.0000 mg | EXTENDED_RELEASE_TABLET | Freq: Every day | ORAL | Status: DC

## 2014-07-08 MED ORDER — CLONIDINE HCL 0.2 MG PO TABS
0.2000 mg | ORAL_TABLET | Freq: Three times a day (TID) | ORAL | Status: DC
  Administered 2014-07-08 – 2014-07-09 (×4): 0.2 mg via ORAL
  Filled 2014-07-08 (×4): qty 1

## 2014-07-08 MED ORDER — ACETAMINOPHEN 325 MG PO TABS: 650.0000 mg | ORAL_TABLET | Freq: Four times a day (QID) | ORAL | Status: DC | PRN

## 2014-07-08 MED ORDER — SALINE SPRAY 0.65 % NA SOLN
1.0000 | NASAL | Status: DC | PRN
  Filled 2014-07-08: qty 44

## 2014-07-08 MED ORDER — ENOXAPARIN SODIUM 40 MG/0.4ML ~~LOC~~ SOLN
40.0000 mg | SUBCUTANEOUS | Status: DC
  Administered 2014-07-08 – 2014-07-09 (×2): 40 mg via SUBCUTANEOUS
  Filled 2014-07-08 (×2): qty 0.4

## 2014-07-08 MED ORDER — LORATADINE 10 MG PO TABS
10.0000 mg | ORAL_TABLET | Freq: Every day | ORAL | Status: DC
Start: 2014-07-08 — End: 2014-07-09
  Administered 2014-07-08 – 2014-07-09 (×2): 10 mg via ORAL
  Filled 2014-07-08 (×2): qty 1

## 2014-07-08 MED ORDER — ACETAMINOPHEN 650 MG RE SUPP: 650.0000 mg | Freq: Four times a day (QID) | RECTAL | Status: DC | PRN

## 2014-07-08 MED ORDER — VERAPAMIL HCL ER 240 MG PO TBCR
360.0000 mg | EXTENDED_RELEASE_TABLET | Freq: Every day | ORAL | Status: DC
  Administered 2014-07-08: 360 mg via ORAL

## 2014-07-08 MED ORDER — ALBUTEROL SULFATE (2.5 MG/3ML) 0.083% IN NEBU
2.5000 mg | INHALATION_SOLUTION | RESPIRATORY_TRACT | Status: DC | PRN
Start: 2014-07-08 — End: 2014-07-09
  Administered 2014-07-08: 2.5 mg via RESPIRATORY_TRACT
  Filled 2014-07-08: qty 3

## 2014-07-08 MED ORDER — SODIUM CHLORIDE 0.9 % IJ SOLN
3.0000 mL | Freq: Two times a day (BID) | INTRAMUSCULAR | Status: DC
  Administered 2014-07-08 – 2014-07-09 (×3): 3 mL via INTRAVENOUS

## 2014-07-08 MED ORDER — IPRATROPIUM-ALBUTEROL 0.5-2.5 (3) MG/3ML IN SOLN
3.0000 mL | Freq: Four times a day (QID) | RESPIRATORY_TRACT | Status: DC
  Administered 2014-07-09 (×2): 3 mL via RESPIRATORY_TRACT
  Filled 2014-07-08 (×2): qty 3

## 2014-07-08 MED ORDER — SODIUM CHLORIDE 0.9 % IJ SOLN: 3.0000 mL | INTRAMUSCULAR | Status: DC | PRN

## 2014-07-08 MED ORDER — GABAPENTIN 300 MG PO CAPS
600.0000 mg | ORAL_CAPSULE | Freq: Three times a day (TID) | ORAL | Status: DC
  Administered 2014-07-08 – 2014-07-09 (×4): 600 mg via ORAL
  Filled 2014-07-08 (×4): qty 2

## 2014-07-08 MED ORDER — BUDESONIDE-FORMOTEROL FUMARATE 160-4.5 MCG/ACT IN AERO
2.0000 | INHALATION_SPRAY | Freq: Two times a day (BID) | RESPIRATORY_TRACT | Status: DC
  Administered 2014-07-08 – 2014-07-09 (×3): 2 via RESPIRATORY_TRACT
  Filled 2014-07-08: qty 6

## 2014-07-08 MED ORDER — HYDROCODONE-ACETAMINOPHEN 5-325 MG PO TABS: 1.0000 | ORAL_TABLET | Freq: Four times a day (QID) | ORAL | Status: DC | PRN

## 2014-07-08 MED ORDER — TRAZODONE HCL 150 MG PO TABS
150.0000 mg | ORAL_TABLET | Freq: Every day | ORAL | Status: DC
  Administered 2014-07-08: 150 mg via ORAL
  Filled 2014-07-08: qty 1

## 2014-07-08 MED ORDER — IPRATROPIUM-ALBUTEROL 0.5-2.5 (3) MG/3ML IN SOLN
3.0000 mL | RESPIRATORY_TRACT | Status: DC
  Administered 2014-07-08 (×4): 3 mL via RESPIRATORY_TRACT
  Filled 2014-07-08 (×4): qty 3

## 2014-07-08 MED ORDER — MONTELUKAST SODIUM 10 MG PO TABS
10.0000 mg | ORAL_TABLET | Freq: Every day | ORAL | Status: DC
  Administered 2014-07-08: 10 mg via ORAL
  Filled 2014-07-08: qty 1

## 2014-07-08 MED ORDER — OXYBUTYNIN CHLORIDE 5 MG PO TABS
5.0000 mg | ORAL_TABLET | Freq: Three times a day (TID) | ORAL | Status: DC
  Administered 2014-07-08 – 2014-07-09 (×4): 5 mg via ORAL
  Filled 2014-07-08 (×4): qty 1

## 2014-07-08 MED ORDER — ASPIRIN EC 81 MG PO TBEC
81.0000 mg | DELAYED_RELEASE_TABLET | Freq: Every day | ORAL | Status: DC
  Administered 2014-07-08 – 2014-07-09 (×2): 81 mg via ORAL
  Filled 2014-07-08 (×2): qty 1

## 2014-07-08 MED ORDER — GUAIFENESIN ER 600 MG PO TB12
600.0000 mg | ORAL_TABLET | Freq: Two times a day (BID) | ORAL | Status: DC
  Administered 2014-07-08 – 2014-07-09 (×4): 600 mg via ORAL
  Filled 2014-07-08 (×4): qty 1

## 2014-07-08 MED ORDER — VERAPAMIL HCL ER 180 MG PO TBCR
360.0000 mg | EXTENDED_RELEASE_TABLET | Freq: Every day | ORAL | Status: DC
  Filled 2014-07-08: qty 2

## 2014-07-08 NOTE — Progress Notes (Signed)
SATURATION QUALIFICATIONS:  Patient Saturations on Room Air at Rest = 94%  Patient Saturations on Room Air while Ambulating = 94%  Patient Saturations on 2 Liters of oxygen while Ambulating =96%  Pt reports that she has not been using oxygen at home all the time only if needed when she is short of breath.

## 2014-07-08 NOTE — Progress Notes (Signed)
0755 Nurse tech came to nurse stated patient was unable to tell her the date and that the son stated she woke him up having a seizure. Went to assess patient and patient was unable to tell nurse her name and unable to speak. Patient seem to be in a postictql state after a  seizure 0800 [facility] [phone] Patient alert and oriented. Patient states she has these seizures at home. Son was told if she has another to please notified the nurse so we can witness what type of seizure she is having. Will continue to monitor.

## 2014-07-08 NOTE — Care Management Note (Signed)
Case Management Note  Patient Details  Name: KEISHLA NADOLNY MRN: [id number] Date of Birth: 03-Mar-1960  Subjective/Objective:                    Action/Plan: Referral for assistance with Spiriva . Provided patient with assistance application , also started application . Will need MD to complete MD section and provide prescription. Once prescription completed will fax to assistance program .   Spoke to patient regarding CPAP machine . Apparently machine is only 55 years old , received CPAP machine through a company in [place] , however, patient unsure of company name.  Patient and visitor states CPAP machine "is in storage , locked up " and they are unable to get it.   Confirmed with  Jermain with [place] insurance will not cover another machine . Patient aware.    DR Brita Romp aware  Magdalen Spatz RN BSN [phone]   Expected Discharge Date:                  Expected Discharge Plan:  Home/Self Care  In-House Referral:     Discharge planning Services  CM Consult, Medication Assistance  Post Acute Care Choice:    Choice offered to:     DME Arranged:    DME Agency:     HH Arranged:    HH Agency:     Status of Service:  In process, will continue to follow  Medicare Important Message Given:    Date Medicare IM Given:    Medicare IM give by:    Date Additional Medicare IM Given:    Additional Medicare Important Message give by:     If discussed at [place] of Stay Meetings, dates discussed:    Additional Comments:  Marilu Favre, RN 07/08/2014, 1:00 PM

## 2014-07-08 NOTE — Progress Notes (Signed)
  Echocardiogram 2D Echocardiogram has been performed.  Tiffany [name] 07/08/2014, 11:36 AM

## 2014-07-08 NOTE — Progress Notes (Signed)
RT spoke with patient about wearing CPAP and patient stated that she doesn't wear at home and doesn't want to wear here in the hospital.  RT advised patient if she changes her mind to give Respiratory a call. RT will continue to monitor.

## 2014-07-08 NOTE — Progress Notes (Signed)
Family Medicine Teaching Service Daily Progress Note Intern Pager: [phone]  Patient name: Tiffany [name] Medical record number: [id number] Date of birth: [birth year] Age: 54 y.o. Gender: female  Primary Care Provider: Christa See, MD Consultants: none Code Status: FULL  Pt Overview and Major Events to Date:  06/22: Admitted  Assessment and Plan: Tiffany [name] is a 54 y.o. female presenting with chronic chest congestion x 3 months with some worsening dyspnea / DOE, failed outpatient therapy for COPD exac. PMH is significant for COPD, ?mixed asthma, active chronic tobacco abuse, OSA (on CPAP), grd 1 diastolic CHF (HFpEF 123456), T2DM (well-controlled), morbid obesity, HLD, HTN, Depression with Anxiety, Chronic Pain Syndrome, reported h/o remote MI (negative cath), h/o seizures.  # Acute COPD Exac, chronic active tobacco smoker  Suspected multifactorial etiology.  Currently with no increased work of breathing, significant congestion in nasal/upper airway.  Previously had chronic 2L O2 (>1 year ago, since discontinued). EKG sinus. Trop neg x1 - Vitals per protocol, cont pulse ox - Supplemental O2 PRN >90% - Ambulate to determine O2 reqs, may need home O2  - Duonebs q 4 hr scheduled, Albuterol neb q 2 hr PRN (no PRNs needed) - Prednisone 50mg  PO x 5 days (repeat course) - cont Symbicort - cont Singulair - Hold off on antibiotics currently, recently s/p Levaquin without relief - Mucinex 600mg  BID - Incentive spiro - Smoking cessation - anti-histamines with loratadine, pepcid for allergic component - Nasal saline PRN - Consider outpatient Pulm follow-up on discharge - c/s CM for Spiriva/med needs and CPAP machine.  # Chronic Diastolic (Grd 1) CHF with HFpEF, no evidence acute exac.  Chronic history HFpEF, chronic b/l LE, stable orthopnea, without clinical evidence overload on admit, wt stable 252 lb to 249 last OV, BNP 24, CXR w/o vasc congestion but stable cardiomegaly. Last ECHO  2014 (EF 60-65%). Remote h/o MI, less likely ICM. - Hold home Lasix, not clinically overloaded - ECHO ordered - Not on ACE/ARB currently, consider adding  # CAD Risk, HLD.  Remote h/o MI reported yr 2000, reassuring cath. CAD equivalent with DM2, also multiple other cardiac risk factors, HTN, HLD. No active CP, possible DOE may be anginal equivalent in DM2 female. - Start ASA 81mg  daily (recommend continue on discharge) - Continue atorvastatin 40mg  daily - Check Troponin-I x 2 (neg poc in ED)  # OSA, on CPAP.  No CPAP for past 4 months (due to recent move, currently in storage) - Resume CPAP in hospital - Consider consult CM for DME, may need new CPAP  # HTN.  BP 161/79 this am. Did not get BP meds last night. - Cont home Verapamil, Clonidine TID - Not on ACE/ARB currently  # DM2, Last A1c 5.5 (05/2014) - Hold home metformin - CBGs TID AC, hold SSI unless significantly elevated on prednisone  # Chronic anxiety, depression, bipolar 1, h/o seizures.  - continue home Geodon, Trazodone - Gabapentin 600mg  TID for seizures and neuropathy  # Chronic Tobacco Abuse Active chronic smoker, 1 to 1.5ppd, >30 yrs. Never quit before. Suspected likely major source of her symptoms. - Smoking cessation counseling  # Chronic Pain Syndrome, neck spasms - Continue home Norco, Flexeril (caution with 54 yr pt future consider switch back to baclofen)  # GERD - Continue home PPI, H2 blocker  FEN/GI: Heart-carb diet / KVO IVF Prophylaxis: Lovenox 40mg  [place] daily  Disposition: Discharge pending ECHO and continued improvement in breathing.  This afternoon vs tomorrow am.  Subjective:  Patient reports  that she is feeling somewhat better since admission.  Still feels congested but feels like congestion if breaking up some.  [name] breathing well today.  No CP, SOB, edema, nausea or vomiting. Son reports that she had a 10sec seizure this morning.  He reports this happens sometimes at home as well.  Per RN  post-ictal but no loss of bowel or bladder function.  Patient reports it happens when she misses medication and that she did not take and of her meds last night.   Objective: Temp:  [97.9 F (36.6 C)-98.5 F (36.9 C)] 97.9 F (36.6 C) (06/22 0801) Pulse Rate:  [50-96] 76 (06/22 0801) Resp:  [13-26] 18 (06/22 0801) BP: (103-161)/(53-104) 161/79 mmHg (06/22 0801) SpO2:  [91 %-100 %] 96 % (06/22 0927) FiO2 (%):  [2 %] 2 % (06/22 0313) Weight:  [249 lb (112.946 kg)-252 lb 12.8 oz (114.669 kg)] 252 lb 12.8 oz (114.669 kg) (06/22 [id number]) Physical Exam: General: awake, alert, well appearing, NAD, [place] in place, son at bedside Cardiovascular: RRR, systolic murmur appreciated, +2DP Respiratory: globally decreased breath sounds (likely 2/2 body habitus), no increased WOB, no wheeze, [place] in place Abdomen: obese, soft, NT/ND, +BS Extremities: WWP, no edema  Laboratory:  Recent Labs Lab 07/07/14 1753 07/08/14 0430  WBC 12.4* 16.0*  HGB 11.5* 11.1*  HCT 35.7* 35.6*  PLT 339 345    Recent Labs Lab 07/07/14 1753 07/08/14 0430  NA 140 140  K 3.5 3.4*  CL 105 104  CO2 28 24  BUN 17 14  CREATININE 1.09* 1.17*  CALCIUM 8.6* 8.7*  GLUCOSE 85 211*    Cardiac Panel (last 3 results)  Recent Labs  07/08/14 0430  TROPONINI <0.03   Imaging/Diagnostic Tests: Dg Chest 2 View  07/07/2014   CLINICAL DATA:  Cough.  Chest pain for 2 months.  EXAM: CHEST  2 VIEW  COMPARISON:  01/03/2013.  FINDINGS: Stable enlargement of the cardiopericardial silhouette. No failure. No airspace disease. No effusion. Suboptimal evaluation of the anterior mediastinum because the arms are not raised over head. Thoracic spondylosis. Exam degraded by obese body habitus.  IMPRESSION: Stable cardiomegaly without failure.   Electronically Signed   By: Dereck Ligas M.D.   On: 07/07/2014 18:05   [name], DO 07/08/2014, 9:49 AM PGY-1, [place] Intern pager: [phone], text pages  welcome

## 2014-07-08 NOTE — H&P (Signed)
[facility] Admission History and Physical Service Pager: [phone]  Patient name: Tiffany [name] Medical record number: [id number] Date of birth: 28-Mar-1960 Age: 54 y.o. Gender: female  Primary Care Provider: Christa See, MD Consultants: none Code Status: Full (confirmed on admission)  Chief Complaint: Dyspnea, Chronic Chest Congestion  Assessment and Plan: Tiffany [name] is a 54 y.o. female presenting with chronic chest congestion x 3 months with some worsening dyspnea / DOE, failed outpatient therapy for COPD exac. PMH is significant for COPD, ?mixed asthma, active chronic tobacco abuse, OSA (on CPAP), grd 1 diastolic CHF (HFpEF 123456), T2DM (well-controlled), morbid obesity, HLD, HTN, Depression with Anxiety, Chronic Pain Syndrome, reported h/o remote MI (negative cath), h/o seizures.  # Acute COPD Exac, chronic active tobacco smoker Suspected multifactorial etiology with possible acute COPD/asthma with initial reported bronchospasm in ED, improved on albuterol nebs, also likely factors with active smoker, OHS, OSA (off CPAP), deconditioning. Less likely acute CHF exac (no volume overload). Considered less likely PE (Well's score 0, no prior DVT/PE). Currently with some mild inc work of breathing, significant congestion in nasal/upper airways, some mild wheezing, seems to have cleared. Still dyspneic on ambulation, improved on O2. Previously had chronic 2L O2 (>1 year ago, since discontinued).  - Admit to med-surg, vitals per protocol, cont pulse ox - Supplemental O2 PRN >90% - Ambulate to determine O2 reqs, may need home O2 - Duonebs q 4 hr scheduled, Albuterol neb q 2 hr PRN - Prednisone 50mg  PO x 5 days (repeat course) - cont Symbicort - cont Singulair - Hold off on antibiotics currently, recently s/p Levaquin without relief - Mucinex 600mg  BID - Incentive spiro - Smoking cessation - anti-histamines with loratadine, pepcid for allergic  component - Nasal saline PRN - Consider outpatient Pulm follow-up on discharge  # Chronic Diastolic (Grd 1) CHF with HFpEF, no evidence acute exac Chronic history HFpEF, chronic b/l LE, stable orthopnea, without clinical evidence overload on admit, wt stable 252 lb to 249 last OV, BNP 24, CXR w/o vasc congestion but stable cardiomegaly. Last ECHO 2014 (EF 60-65%). Remote h/o MI, less likely ICM. - Hold home Lasix, not clinically overloaded - Check ECHO - Not on ACE/ARB currently, consider adding  # CAD Risk, HLD Remote h/o MI reported yr 2000, reassuring cath. CAD equivalent with DM2, also multiple other cardiac risk factors, HTN, HLD. No active CP, possible DOE may be anginal equivalent in DM2 female. - Start ASA 81mg  daily (recommend continue on discharge) - Continue atorvastatin 40mg  daily - Check Troponin-I x 2 (neg poc in ED) - Repeat EKG in AM  # OSA, on CPAP No CPAP for past 4 months (due to recent move, currently in storage) - Resume CPAP in hospital - Consider consult CM for DME, may need new CPAP  # HTN Stable to elevated BP - Cont home Verapamil, Clonidine TID - Not on ACE/ARB currently  # DM2, Last A1c 5.5 (05/2014) - Hold home metformin - CBGs TID AC, hold SSI unless significantly elevated on prednisone  # Chronic anxiety, depression, bipolar 1, h/o seizures - continue home Geodon, Trazodone - Gabapentin 600mg  TID for seizures and neuropathy  # Chronic Tobacco Abuse Active chronic smoker, 1 to 1.5ppd, >30 yrs. Never quit before. Suspected likely major source of her symptoms. - Smoking cessation counseling  # Chronic Pain Syndrome, neck spasms - Continue home Norco, Flexeril (caution with 54 yr pt future consider switch back to baclofen)  # GERD - Continue home PPI,  H2 blocker  FEN/GI: Heart-carb diet / KVO IVF Prophylaxis: Lovenox 40mg  [place] daily  Disposition: Admit to med-surg, observation, treat COPD exac duonebs and prednisone, work-up possible CHF with  ECHO. May need further outpatient work-up.  History of Present Illness:  Tiffany [name] is a 54 y.o. female presenting with chronic nasal and chest congestion associated with dyspnea. Reports symptoms all started about 3 months ago with "bad cold, URI" and then persisted with chronic chest congestion with "rattling / noisy breathing" for past 3 months without significant improvement but does not seem to be acutely worsening. Associated with wet cough but says "mucus is very thick" has difficulty actually producing the sputum, also nasal congestion (limiting her ability to breathe through nose, mostly mouth-breathing now). Also occasional DOE (not always). Previously on home O2 2L continuous reportedly for COPD, since discontinued 1 year ago (not clear why). - Recently seen at [facility] 2x within past 2 months for same complaints without improvement. S/p initial course antibiotics with Levaquin (no relief), tried variety of OTC meds/therapy including anti-histamines, flonase, had not tried mucinex, saline nasal spray. Recently adjusted COPD meds added Spiriva (however never picked up due to cost), continues regular Albuterol inhaler use (some relief) using 2-4x daily for past 3 months. - Additionally h/o OSA previously on CPAP. Currently CPAP and Nebulizer machines are in storage, moved in February and has not had either. - Admits shortness of breath occasionally, worse at night (with nasal congestion), orthopnea (+2 pillows), chest tightness with congestion - Denies fevers/chills, HA, abdominal pain, nausea / vomiting, diarrhea, constipation  Additionally, patient is an active smoker, 1 to 1.5ppd, chronic 30-pack years. Never attempted to quit before. Interested and aware she needs to quit, has not tried any NRT or meds before.   Lives at home with [name] (one of primary caregivers), limited by chronic b/l lower ext (hip) weakness following prior CVA. Sometimes uses cane for ambulation  Review Of Systems:  Per HPI with the following additions: none Otherwise 12 point review of systems was performed and was unremarkable.  Patient Active Problem List   Diagnosis Date Noted  . COPD exacerbation 06/26/2014  . Seborrheic keratoses 12/31/2013  . Thrush 09/19/2013  . Knee pain, bilateral 01/22/2013  . Seizure 01/04/2013  . Health care maintenance 11/25/2012  . Folliculitis 0000000  . History of kidney stones 06/18/2012  . Chronic pain syndrome 06/18/2012  . Muscle spasm 06/18/2012  . Elevated serum creatinine 04/28/2012  . Asthma with acute exacerbation 04/28/2012  . Allergic reaction 04/07/2012  . Bipolar 1 disorder 12/08/2011  . Itching 09/06/2011  . HSV infection 08/30/2011  . Dyslipidemia 04/24/2011  . Anemia 04/24/2011  . Depression with anxiety 04/24/2011  . Diabetic neuropathy 04/24/2011  . OSA on CPAP 10/18/2010  . Asthma 10/18/2010  . Obesity 10/18/2010  . HTN (hypertension) 10/18/2010  . Type 2 diabetes mellitus 10/18/2010   Past Medical History: Past Medical History  Diagnosis Date  . High blood pressure   . Incontinence   . Anxiety   . Manic depression   . Heart attack   . Diabetes mellitus     class 2  . Asthma   . Emphysema   . High cholesterol   . Stroke   . Sinus trouble   . Seasonal allergies   . Sleep apnea   . Disorder of nervous system   . Myocardial infarct   . Complication of anesthesia     decreased bp, decreased heart rate   Past Surgical History:  Past Surgical History  Procedure Laterality Date  . Abdominal hysterectomy    . Cesarean section    . Hiatal hernia repair     Social History: History  Substance Use Topics  . Smoking status: Former Smoker    Types: Cigarettes    Quit date: 04/23/1977  . Smokeless tobacco: Never Used  . Alcohol Use: No   Additional social history: active smoker 1 to 1.5ppd, chronic >30 years, no alcohol or other drugs.  Please also refer to relevant sections of EMR.  Family History: Family History   Problem Relation Age of Onset  . Cancer Father     prostate  . Heart failure      cousin  . Cancer Mother     lung   Allergies and Medications: Allergies  Allergen Reactions  . Codeine Nausea And Vomiting  . Hydroxyzine   . Sulfa Antibiotics Itching   No current facility-administered medications on file prior to encounter.   Current Outpatient Prescriptions on File Prior to Encounter  Medication Sig Dispense Refill  . albuterol (PROVENTIL HFA;VENTOLIN HFA) 108 (90 BASE) MCG/ACT inhaler Inhale 2 puffs into the lungs every 6 (six) hours as needed. 1 Inhaler 6  . albuterol (PROVENTIL) (2.5 MG/3ML) 0.083% nebulizer solution INHALE CONTENTS OF 1 VIAL VIA NEBULIZER EVERY 6  HOURS AS NEEDED FOR SHORTNESS OF BREATH. 90 mL 1  . atorvastatin (LIPITOR) 40 MG tablet Take 1 tablet (40 mg total) by mouth daily. 90 tablet 4  . budesonide-formoterol (SYMBICORT) 160-4.5 MCG/ACT inhaler Inhale 2 puffs into the lungs 2 (two) times daily. 1 Inhaler 6  . cloNIDine (CATAPRES) 0.2 MG tablet Take 1 tablet (0.2 mg total) by mouth 3 (three) times daily. 270 tablet 3  . cyclobenzaprine (FLEXERIL) 10 MG tablet Take 1 tablet (10 mg total) by mouth 3 (three) times daily as needed for muscle spasms. 60 tablet 2  . fexofenadine (ALLEGRA) 60 MG tablet Take 2 tablets (120 mg total) by mouth 2 (two) times daily. 90 tablet 4  . fluticasone (FLONASE) 50 MCG/ACT nasal spray Place 2 sprays into both nostrils daily. 16 g 3  . furosemide (LASIX) 40 MG tablet Take 1 tablet (40 mg total) by mouth daily. 90 tablet 3  . gabapentin (NEURONTIN) 600 MG tablet Take 1 tablet (600 mg total) by mouth 3 (three) times daily. 270 tablet 4  . HYDROcodone-acetaminophen (NORCO/VICODIN) 5-325 MG per tablet Take 1 tablet by mouth every 6 (six) hours as needed for moderate pain or severe pain. 30 tablet 0  . metFORMIN (GLUMETZA) 1000 MG (MOD) 24 hr tablet Take 1 tablet (1,000 mg total) by mouth daily with breakfast. 90 tablet 4  . montelukast  (SINGULAIR) 10 MG tablet Take 1 tablet (10 mg total) by mouth at bedtime. 90 tablet 3  . naproxen (NAPROSYN) 500 MG tablet Take 1 tablet (500 mg total) by mouth 3 (three) times daily as needed. 270 tablet 3  . nystatin (MYCOSTATIN) 100000 UNIT/ML suspension TAKE 5 MLS (500,000 UNITS TOTAL) BY MOUTH 4 (FOUR) TIMES DAILY. 473 mL 1  . omeprazole (PRILOSEC) 40 MG capsule Take 1 capsule (40 mg total) by mouth daily. 90 capsule 1  . oxybutynin (DITROPAN) 5 MG tablet Take 1 tablet (5 mg total) by mouth 3 (three) times daily. 270 tablet 3  . potassium chloride (K-DUR) 10 MEQ tablet Take 2 tablets (20 mEq total) by mouth daily. 180 tablet 2  . predniSONE (DELTASONE) 50 MG tablet Take 1 tablet (50 mg total) by mouth daily  with breakfast. 5 tablet 0  . ranitidine (ZANTAC) 150 MG tablet Take 1 tablet (150 mg total) by mouth 2 (two) times daily. 60 tablet 1  . tiotropium (SPIRIVA HANDIHALER) 18 MCG inhalation capsule Place 1 capsule (18 mcg total) into inhaler and inhale once. (Patient taking differently: Place 18 mcg into inhaler and inhale daily. ) 90 capsule 1  . traZODone (DESYREL) 150 MG tablet Take 1 tablet (150 mg total) by mouth at bedtime. 90 tablet 2  . verapamil (VERELAN PM) 360 MG 24 hr capsule Take 1 capsule (360 mg total) by mouth at bedtime. 90 capsule 4  . ziprasidone (GEODON) 80 MG capsule Take 1 capsule (80 mg total) by mouth 2 (two) times daily with a meal. 180 capsule 3  . ALPRAZolam (XANAX) 1 MG tablet Take 1 tablet (1 mg total) by mouth at bedtime as needed for anxiety. (Patient not taking: Reported on 07/07/2014) 60 tablet 1  . traMADol (ULTRAM) 50 MG tablet Take 1-2 tablets (50-100 mg total) by mouth every 12 (twelve) hours as needed. Filled via fax, [place], 09/29/13. (Patient not taking: Reported on 07/07/2014) 270 tablet 0    Objective: BP 147/57 mmHg  Pulse 79  Temp(Src) 98.5 F (36.9 C) (Oral)  Resp 20  Ht 5\' 4"  (1.626 m)  Wt 249 lb (112.946 kg)  BMI 42.72 kg/m2  SpO2  98% Exam: Gen - morbidly obese, 75 yr F, chronically ill appearing, some resp difficulty with nasal and upper airway congestion, cooperative, comfortable laying in bed, NAD HEENT - NCAT, PERRL, EOMI, congested nares b/l with edematous turbinates, oropharynx clear, dry MM (breathing via mouth mostly) Neck - supple, non-tender, large neck Heart - RRR, no murmurs, rubs or gallops heard. Lungs - Mostly CTAB with notable transmitted upper airway congestion / scattered wheezes (inc audible without auscultation) significantly less exp wheezes within lungs, no focal crackles or rhonchi. Mild decreased air movement, limited by body habitus. Mild inc work of breathing due to nasal congestion, but speaks full sentences. Abd - soft, obese, NTND, no masses, +active BS Ext - mild +TTP general anterior/posterior legs without focal, trace pitting edema b/l LE, or erythema, Homan's negative, peripheral pulses intact +2 b/l Skin - warm, dry, no rashes Neuro - awake, alert, oriented, grossly non-focal, intact muscle strength 5/5 b/l grip and ankle dorsiflexion, intact distal sensation to light touch, gait normal with symptoms of SOB and dizziness, balance appropriate  Labs and Imaging: CBC BMET   Recent Labs Lab 07/07/14 1753  WBC 12.4*  HGB 11.5*  HCT 35.7*  PLT 339    Recent Labs Lab 07/07/14 1753  NA 140  K 3.5  CL 105  CO2 28  BUN 17  CREATININE 1.09*  GLUCOSE 85  CALCIUM 8.6*     BNP 24.4 Troponin (poc) 0.01  6/21 CXR 2v IMPRESSION: Stable cardiomegaly without failure.  EKG Sinus, HR 52, no acute ST-T wave ischemic changes. Stable comparison.  Olin Hauser, DO 07/08/2014, 2:02 AM PGY-2, [place] Intern pager: [phone], text pages welcome

## 2014-07-09 DIAGNOSIS — F419 Anxiety disorder, unspecified: Secondary | ICD-10-CM | POA: Diagnosis not present

## 2014-07-09 DIAGNOSIS — E119 Type 2 diabetes mellitus without complications: Secondary | ICD-10-CM | POA: Diagnosis not present

## 2014-07-09 DIAGNOSIS — I517 Cardiomegaly: Secondary | ICD-10-CM | POA: Diagnosis not present

## 2014-07-09 DIAGNOSIS — E785 Hyperlipidemia, unspecified: Secondary | ICD-10-CM | POA: Diagnosis not present

## 2014-07-09 DIAGNOSIS — G4733 Obstructive sleep apnea (adult) (pediatric): Secondary | ICD-10-CM | POA: Diagnosis not present

## 2014-07-09 DIAGNOSIS — G894 Chronic pain syndrome: Secondary | ICD-10-CM | POA: Diagnosis not present

## 2014-07-09 DIAGNOSIS — I252 Old myocardial infarction: Secondary | ICD-10-CM | POA: Diagnosis not present

## 2014-07-09 DIAGNOSIS — I10 Essential (primary) hypertension: Secondary | ICD-10-CM | POA: Diagnosis not present

## 2014-07-09 DIAGNOSIS — J441 Chronic obstructive pulmonary disease with (acute) exacerbation: Secondary | ICD-10-CM | POA: Diagnosis not present

## 2014-07-09 LAB — GLUCOSE, CAPILLARY
Glucose-Capillary: 82 mg/dL (ref 65–99)
Glucose-Capillary: 100 mg/dL — ABNORMAL HIGH (ref 65–99)

## 2014-07-09 MED ORDER — TIOTROPIUM BROMIDE MONOHYDRATE 18 MCG IN CAPS
18.0000 ug | ORAL_CAPSULE | Freq: Every day | RESPIRATORY_TRACT | Status: DC
Start: 2014-07-09 — End: 2014-08-10

## 2014-07-09 MED ORDER — ASPIRIN 81 MG PO TBEC: 81.0000 mg | DELAYED_RELEASE_TABLET | Freq: Every day | ORAL | Status: DC

## 2014-07-09 MED ORDER — GUAIFENESIN ER 600 MG PO TB12: 600.0000 mg | ORAL_TABLET | Freq: Two times a day (BID) | ORAL | Status: DC

## 2014-07-09 MED ORDER — PREDNISONE 50 MG PO TABS: 50.0000 mg | ORAL_TABLET | Freq: Every day | ORAL | Status: DC

## 2014-07-09 MED ORDER — NICOTINE 21 MG/24HR TD PT24: 21.0000 mg | MEDICATED_PATCH | Freq: Every day | TRANSDERMAL | Status: DC

## 2014-07-09 NOTE — Progress Notes (Signed)
Family Medicine Teaching Service Daily Progress Note Intern Pager: [phone]  Patient name: Tiffany [name] Medical record number: [id number] Date of birth: October 15, 1960 Age: 54 y.o. Gender: female  Primary Care Provider: Christa See, MD Consultants: none Code Status: FULL  Pt Overview and Major Events to Date:  06/22: Admitted  Assessment and Plan: Tiffany [name] is a 54 y.o. female presenting with chronic chest congestion x 3 months with some worsening dyspnea / DOE, failed outpatient therapy for COPD exac. PMH is significant for COPD, ?mixed asthma, active chronic tobacco abuse, OSA (on CPAP), grd 1 diastolic CHF (HFpEF 123456), T2DM (well-controlled), morbid obesity, HLD, HTN, Depression with Anxiety, Chronic Pain Syndrome, reported h/o remote MI (negative cath), h/o seizures.  # Acute COPD Exac, chronic active tobacco smoker  Breathing on RA.  Suspected multifactorial etiology.  Currently with no increased work of breathing, significant congestion in nasal/upper airway.  Previously had chronic 2L O2 (>1 year ago, since discontinued). EKG sinus. Trop neg - Vitals per protocol, cont pulse ox - Supplemental O2 PRN >90% - Ambulate to determine O2 reqs,does not meet O2 req. - Duonebs q 4 hr scheduled, Albuterol neb q 2 hr PRN (no PRNs needed) - Prednisone 50mg  PO x 5 days (repeat course) - cont Symbicort - cont Singulair - Hold off on antibiotics currently, recently s/p Levaquin without relief - Mucinex 600mg  BID - Incentive spiro - Smoking cessation - anti-histamines with loratadine, pepcid for allergic component - Nasal saline PRN - Consider outpatient Pulm follow-up on discharge - c/s CM for Spiriva/med needs and CPAP machine: cannot get CPAP, but can help with Spiriva  # Chronic Diastolic (Grd 1) CHF with HFpEF, no evidence acute exac.  Chronic history HFpEF, chronic b/l LE, stable orthopnea, without clinical evidence overload on admit, wt stable 252 lb to 249 last OV, BNP  24, CXR w/o vasc congestion but stable cardiomegaly. Last ECHO 2014 (EF 60-65%). Remote h/o MI, less likely ICM. Echo normal systolic fxn, EF XX123456 to 123456 w/ mild MR - Hold home Lasix, not clinically overloaded - ECHO ordered - Not on ACE/ARB currently, consider adding  # CAD Risk, HLD.  Remote h/o MI reported yr 2000, reassuring cath. CAD equivalent with DM2, also multiple other cardiac risk factors, HTN, HLD. No active CP, possible DOE may be anginal equivalent in DM2 female. - Start ASA 81mg  daily (recommend continue on discharge) - Continue atorvastatin 40mg  daily - Check Troponin-I x 2 (neg poc in ED)  # OSA, on CPAP.  No CPAP for past 4 months (due to recent move, currently in storage) - Resume CPAP in hospital - Consider consult CM for DME, may need new CPAP  # HTN.  BP 161/90 this am.  - Cont home Verapamil, Clonidine TID - Not on ACE/ARB currently  # DM2, Last A1c 5.5 (05/2014) - Hold home metformin - CBGs TID AC, hold SSI unless significantly elevated on prednisone  # Chronic anxiety, depression, bipolar 1, h/o seizures.  - continue home Geodon, Trazodone - Gabapentin 600mg  TID for seizures and neuropathy  # Chronic Tobacco Abuse Active chronic smoker, 1 to 1.5ppd, >30 yrs. Never quit before. Suspected likely major source of her symptoms. - Smoking cessation counseling  # Chronic Pain Syndrome, neck spasms - Continue home Norco, Flexeril (caution with 54 yr pt future consider switch back to baclofen)  # GERD - Continue home PPI, H2 blocker  FEN/GI: Heart-carb diet / KVO IVF Prophylaxis: Lovenox 40mg  [place] daily  Disposition: Discharge today. Subjective:  Congestion improving.  [name] breathing well today.  No CP, SOB, edema, nausea or vomiting. Discussed smoking cessation.  Patient very interested.  Objective: Temp:  [97.9 F (36.6 C)-98.6 F (37 C)] 98.6 F (37 C) (06/23 0504) Pulse Rate:  [63-88] 70 (06/23 0630) Resp:  [18-20] 18 (06/23 0504) BP:  (122-183)/(75-95) 161/90 mmHg (06/23 0630) SpO2:  [92 %-99 %] 99 % (06/23 0801) Physical Exam: General: awake, alert, well appearing, NAD, son at bedside Cardiovascular: RRR, systolic murmur appreciated, +2DP Respiratory: globally decreased breath sounds (likely 2/2 body habitus), no increased WOB, no wheeze, [place] in place Abdomen: obese, soft, NT/ND, +BS Extremities: WWP, no edema  Laboratory:  Recent Labs Lab 07/07/14 1753 07/08/14 0430  WBC 12.4* 16.0*  HGB 11.5* 11.1*  HCT 35.7* 35.6*  PLT 339 345    Recent Labs Lab 07/07/14 1753 07/08/14 0430  NA 140 140  K 3.5 3.4*  CL 105 104  CO2 28 24  BUN 17 14  CREATININE 1.09* 1.17*  CALCIUM 8.6* 8.7*  GLUCOSE 85 211*    Cardiac Panel (last 3 results)  Recent Labs  07/08/14 0430 07/08/14 1146  TROPONINI <0.03 <0.03   Echo: Study Conclusions  - Left ventricle: The cavity size was normal. Wall thickness was increased in a pattern of mild LVH. Systolic function was normal. The estimated ejection fraction was in the range of 55% to 60%. - Mitral valve: There was mild regurgitation. - Left atrium: The atrium was mildly dilated.  Imaging/Diagnostic Tests: Dg Chest 2 View  07/07/2014   CLINICAL DATA:  Cough.  Chest pain for 2 months.  EXAM: CHEST  2 VIEW  COMPARISON:  01/03/2013.  FINDINGS: Stable enlargement of the cardiopericardial silhouette. No failure. No airspace disease. No effusion. Suboptimal evaluation of the anterior mediastinum because the arms are not raised over head. Thoracic spondylosis. Exam degraded by obese body habitus.  IMPRESSION: Stable cardiomegaly without failure.   Electronically Signed   By: Dereck Ligas M.D.   On: 07/07/2014 18:05   [name], DO 07/09/2014, 8:24 AM PGY-1, [place] Intern pager: [phone], text pages welcome

## 2014-07-09 NOTE — Consult Note (Addendum)
[facility] Inpatient Consult   07/09/2014  Tiffany [name] 03-06-1960 [id number] Referral was received.   Patient evaluated for community based chronic disease management services with [place] [facility] as a benefit of patient's [employer] Medicare Insurance. Met with patient at bedside to explain [place] Management services.  Patient states she is having difficulty affording her inhalers. Stating that one inhaler was costing her $166.00 for her copay.  She endorses that she needs assistance with her medications.   Patient will receive post discharge calls and will be evaluated for monthly home visits for assessments and disease process education. Consent form signed and folder with [place] Management information given.  Left contact information and [facility] literature at bedside. Made Inpatient Case Manager aware that [place] Management following.  Inpatient RNCM faxed information for assistance with the patient's Spiriva, as well.  Of note, [facility] Care Management services does not replace or interfere with any services that are arranged by inpatient case management or social work.  For additional questions or referrals please contact:   Natividad Brood, RN BSN [facility] Liaison  [phone] business mobile phone

## 2014-07-09 NOTE — Discharge Instructions (Signed)
You were admitted for shortness of breath and treated with inhalers and steroids.  You are being given a prescription to continue taking these steroids at home for a couple more days.  You have also being given an application to help your Spiriva prescription.  Unfortunately, we could not get a new CPAP machine for you.  You will need to get this out of storage.  We have scheduled a follow up visit for you.  Please make sure to go to this.  We also encourage you to stop smoking.  Nicotine patches have been prescribed for you.  If you have any difficulty obtaining these, please let your Doctor know.   Chronic Obstructive Pulmonary Disease Chronic obstructive pulmonary disease (COPD) is a common lung condition in which airflow from the lungs is limited. COPD is a general term that can be used to describe many different lung problems that limit airflow, including both chronic bronchitis and emphysema. If you have COPD, your lung function will probably never return to normal, but there are measures you can take to improve lung function and make yourself feel better.  CAUSES   Smoking (common).   Exposure to secondhand smoke.   Genetic problems.  Chronic inflammatory lung diseases or recurrent infections. SYMPTOMS   Shortness of breath, especially with physical activity.   Deep, persistent (chronic) cough with a large amount of thick mucus.   Wheezing.   Rapid breaths (tachypnea).   Gray or bluish discoloration (cyanosis) of the skin, especially in fingers, toes, or lips.   Fatigue.   Weight loss.   Frequent infections or episodes when breathing symptoms become much worse (exacerbations).   Chest tightness. DIAGNOSIS  Your health care provider will take a medical history and perform a physical examination to make the initial diagnosis. Additional tests for COPD may include:   Lung (pulmonary) function tests.  Chest X-ray.  CT scan.  Blood tests. TREATMENT    Treatment available to help you feel better when you have COPD includes:   Inhaler and nebulizer medicines. These help manage the symptoms of COPD and make your breathing more comfortable.  Supplemental oxygen. Supplemental oxygen is only helpful if you have a low oxygen level in your blood.   Exercise and physical activity. These are beneficial for nearly all people with COPD. Some people may also benefit from a pulmonary rehabilitation program. HOME CARE INSTRUCTIONS   Take all medicines (inhaled or pills) as directed by your health care provider.  Avoid over-the-counter medicines or cough syrups that dry up your airway (such as antihistamines) and slow down the elimination of secretions unless instructed otherwise by your health care provider.   If you are a smoker, the most important thing that you can do is stop smoking. Continuing to smoke will cause further lung damage and breathing trouble. Ask your health care provider for help with quitting smoking. He or she can direct you to community resources or hospitals that provide support.  Avoid exposure to irritants such as smoke, chemicals, and fumes that aggravate your breathing.  Use oxygen therapy and pulmonary rehabilitation if directed by your health care provider. If you require home oxygen therapy, ask your health care provider whether you should purchase a pulse oximeter to measure your oxygen level at home.   Avoid contact with individuals who have a contagious illness.  Avoid extreme temperature and humidity changes.  Eat healthy foods. Eating smaller, more frequent meals and resting before meals may help you maintain your strength.  Stay  active, but balance activity with periods of rest. Exercise and physical activity will help you maintain your ability to do things you want to do.  Preventing infection and hospitalization is very important when you have COPD. Make sure to receive all the vaccines your health care  provider recommends, especially the pneumococcal and influenza vaccines. Ask your health care provider whether you need a pneumonia vaccine.  Learn and use relaxation techniques to manage stress.  Learn and use controlled breathing techniques as directed by your health care provider. Controlled breathing techniques include:   Pursed lip breathing. Start by breathing in (inhaling) through your nose for 1 second. Then, purse your lips as if you were going to whistle and breathe out (exhale) through the pursed lips for 2 seconds.   Diaphragmatic breathing. Start by putting one hand on your abdomen just above your waist. Inhale slowly through your nose. The hand on your abdomen should move out. Then purse your lips and exhale slowly. You should be able to feel the hand on your abdomen moving in as you exhale.   Learn and use controlled coughing to clear mucus from your lungs. Controlled coughing is a series of short, progressive coughs. The steps of controlled coughing are:   Lean your head slightly forward.   Breathe in deeply using diaphragmatic breathing.   Try to hold your breath for 3 seconds.   Keep your mouth slightly open while coughing twice.   Spit any mucus out into a tissue.   Rest and repeat the steps once or twice as needed. SEEK MEDICAL CARE IF:   You are coughing up more mucus than usual.   There is a change in the color or thickness of your mucus.   Your breathing is more labored than usual.   Your breathing is faster than usual.  SEEK IMMEDIATE MEDICAL CARE IF:   You have shortness of breath while you are resting.   You have shortness of breath that prevents you from:  Being able to talk.   Performing your usual physical activities.   You have chest pain lasting longer than 5 minutes.   Your skin color is more cyanotic than usual.  You measure low oxygen saturations for longer than 5 minutes with a pulse oximeter. MAKE SURE YOU:    Understand these instructions.  Will watch your condition.  Will get help right away if you are not doing well or get worse. Document Released: 10/12/2004 Document Revised: 05/19/2013 Document Reviewed: 08/29/2012 [facility] Patient Information 2015 [place], [place]. This information is not intended to replace advice given to you by your health care provider. Make sure you discuss any questions you have with your health care provider.  Smoking Cessation Quitting smoking is important to your health and has many advantages. However, it is not always easy to quit since nicotine is a very addictive drug. Oftentimes, people try 3 times or more before being able to quit. This document explains the best ways for you to prepare to quit smoking. Quitting takes hard work and a lot of effort, but you can do it. ADVANTAGES OF QUITTING SMOKING  You will live longer, feel better, and live better.  Your body will feel the impact of quitting smoking almost immediately.  Within 20 minutes, blood pressure decreases. Your pulse returns to its normal level.  After 8 hours, carbon monoxide levels in the blood return to normal. Your oxygen level increases.  After 24 hours, the chance of having a heart attack starts to  decrease. Your breath, hair, and body stop smelling like smoke.  After 48 hours, damaged nerve endings begin to recover. Your sense of taste and smell improve.  After 72 hours, the body is virtually free of nicotine. Your bronchial tubes relax and breathing becomes easier.  After 2 to 12 weeks, lungs can hold more air. Exercise becomes easier and circulation improves.  The risk of having a heart attack, stroke, cancer, or lung disease is greatly reduced.  After 1 year, the risk of coronary heart disease is cut in half.  After 5 years, the risk of stroke falls to the same as a nonsmoker.  After 10 years, the risk of lung cancer is cut in half and the risk of other cancers decreases  significantly.  After 15 years, the risk of coronary heart disease drops, usually to the level of a nonsmoker.  If you are pregnant, quitting smoking will improve your chances of having a healthy baby.  The people you live with, especially any children, will be healthier.  You will have extra money to spend on things other than cigarettes. QUESTIONS TO THINK ABOUT BEFORE ATTEMPTING TO QUIT You may want to talk about your answers with your health care provider.  Why do you want to quit?  If you tried to quit in the past, what helped and what did not?  What will be the most difficult situations for you after you quit? How will you plan to handle them?  Who can help you through the tough times? Your family? Friends? A health care provider?  What pleasures do you get from smoking? What ways can you still get pleasure if you quit? Here are some questions to ask your health care provider:  How can you help me to be successful at quitting?  What medicine do you think would be best for me and how should I take it?  What should I do if I need more help?  What is smoking withdrawal like? How can I get information on withdrawal? GET READY  Set a quit date.  Change your environment by getting rid of all cigarettes, ashtrays, matches, and lighters in your home, car, or work. Do not let people smoke in your home.  Review your past attempts to quit. Think about what worked and what did not. GET SUPPORT AND ENCOURAGEMENT You have a better chance of being successful if you have help. You can get support in many ways.  Tell your family, friends, and coworkers that you are going to quit and need their support. Ask them not to smoke around you.  Get individual, group, or telephone counseling and support. Programs are available at [employer] and health centers. Call your local health department for information about programs in your area.  Spiritual beliefs and practices may help some  smokers quit.  Download a "quit meter" on your computer to keep track of quit statistics, such as how long you have gone without smoking, cigarettes not smoked, and money saved.  Get a self-help book about quitting smoking and staying off tobacco. [place] yourself from urges to smoke. Talk to someone, go for a walk, or occupy your time with a task.  Change your normal routine. Take a different route to work. Drink tea instead of coffee. Eat breakfast in a different place.  Reduce your stress. Take a hot bath, exercise, or read a book.  Plan something enjoyable to do every day. Reward yourself for not smoking.  Explore interactive web-based programs that specialize in helping you quit. GET MEDICINE AND USE IT CORRECTLY Medicines can help you stop smoking and decrease the urge to smoke. Combining medicine with the above behavioral methods and support can greatly increase your chances of successfully quitting smoking.  Nicotine replacement therapy helps deliver nicotine to your body without the negative effects and risks of smoking. Nicotine replacement therapy includes nicotine gum, lozenges, inhalers, nasal sprays, and skin patches. Some may be available over-the-counter and others require a prescription.  Antidepressant medicine helps people abstain from smoking, but how this works is unknown. This medicine is available by prescription.  Nicotinic receptor partial agonist medicine simulates the effect of nicotine in your brain. This medicine is available by prescription. Ask your health care provider for advice about which medicines to use and how to use them based on your health history. Your health care provider will tell you what side effects to look out for if you choose to be on a medicine or therapy. Carefully read the information on the package. Do not use any other product containing nicotine while using a nicotine replacement product.  RELAPSE OR  DIFFICULT SITUATIONS Most relapses occur within the first 3 months after quitting. Do not be discouraged if you start smoking again. Remember, most people try several times before finally quitting. You may have symptoms of withdrawal because your body is used to nicotine. You may crave cigarettes, be irritable, feel very hungry, cough often, get headaches, or have difficulty concentrating. The withdrawal symptoms are only temporary. They are strongest when you first quit, but they will go away within 10-14 days. To reduce the chances of relapse, try to:  Avoid drinking alcohol. Drinking lowers your chances of successfully quitting.  Reduce the amount of caffeine you consume. Once you quit smoking, the amount of caffeine in your body increases and can give you symptoms, such as a rapid heartbeat, sweating, and anxiety.  Avoid smokers because they can make you want to smoke.  Do not let weight gain distract you. Many smokers will gain weight when they quit, usually less than 10 pounds. Eat a healthy diet and stay active. You can always lose the weight gained after you quit.  Find ways to improve your mood other than smoking. FOR MORE INFORMATION  www.smokefree.gov  Document Released: 12/27/2000 Document Revised: 05/19/2013 Document Reviewed: 04/13/2011 [facility] Patient Information 2015 [place], [place]. This information is not intended to replace advice given to you by your health care provider. Make sure you discuss any questions you have with your health care provider.  1800-QUITNOW

## 2014-07-09 NOTE — Patient Outreach (Signed)
[facility]) Care Management  07/09/2014  Tiffany [name] 08/07/1960 [id number]   Referral from [place],  Geradine Girt, RN and Nicoletta Ba, PharmD.  [name]. [name], [name] Management [place] Assistant Phone: [phone] Fax: [phone]

## 2014-07-09 NOTE — Progress Notes (Signed)
Milas Gain [place] to be D/C'd Home per MD order.  Discussed with the patient and all questions fully answered.  VSS, Skin clean, dry and intact without evidence of skin break down, no evidence of skin tears noted. IV catheter discontinued intact. Site without signs and symptoms of complications. Dressing and pressure applied.  An After Visit Summary was printed and given to the patient. Patient received prescription. Reviewed with patient the importance of smoking cessation. Pt reported that today will not be the day she stops but she will take the information and consider for the future.   D/c education completed with patient/family including follow up instructions, medication list, d/c activities limitations if indicated, with other d/c instructions as indicated by MD - patient able to verbalize understanding, all questions fully answered.   Patient instructed to return to ED, call 911, or call MD for any changes in condition.   Patient escorted via [place], and D/C home via private auto.  Norva Karvonen 07/09/2014 3:07 PM

## 2014-07-09 NOTE — Care Management Note (Signed)
Case Management Note  Patient Details  Name: Tiffany [name] MRN: [id number] Date of Birth: 1960-03-13  Subjective/Objective:                    Action/Plan: Completed Spiriva assistance application faxed to [employer] , confirmed receipt . Hard copy returned to patient   Expected Discharge Date:                  Expected Discharge Plan:  Home/Self Care  In-House Referral:     Discharge planning Services  CM Consult, Medication Assistance  Post Acute Care Choice:    Choice offered to:     DME Arranged:    DME Agency:     HH Arranged:    HH Agency:     Status of Service:  In process, will continue to follow  Medicare Important Message Given:    Date Medicare IM Given:    Medicare IM give by:    Date Additional Medicare IM Given:    Additional Medicare Important Message give by:     If discussed at [place] of Stay Meetings, dates discussed:    Additional Comments:  Marilu Favre, RN 07/09/2014, 11:04 AM

## 2014-07-09 NOTE — Progress Notes (Signed)
Occupational Therapy Evaluation Patient Details Name: Tiffany [name] MRN: [id number] DOB: 1960/06/11 Today's Date: 07/09/2014    History of Present Illness 54 y.o. female admitted with Acute COPD Exacerbation. PMH is significant for COPD, ?mixed asthma, active chronic tobacco abuse, OSA, grd 1 diastolic CHF, [id number], morbid obesity, HLD, HTN, Depression with Anxiety, Chronic Pain Syndrome, reported h/o remote MI (negative cath), h/o seizures.   Clinical Impression   Pt admitted with the above diagnoses. PTA pt was mod I with ADLs. Pt is currently setup to supervision with ADLs.  Discussed compensatory strategies and AE for ADLs and ways to incorporate energy conservation techniques into ADLs. Provided energy conservation handout. No further OT needs indicated. OT signing off.      Follow Up Recommendations  Supervision - Intermittent;No OT follow up    Equipment Recommendations  Tub/shower seat    Recommendations for Other Services       Precautions / Restrictions Precautions Precautions: None Restrictions Weight Bearing Restrictions: No      Mobility Bed Mobility Overal bed mobility: Modified Independent                Transfers Overall transfer level: Needs assistance Equipment used: None Transfers: Sit to/from Stand Sit to Stand: Supervision         General transfer comment: supervision for safety.    Balance Overall balance assessment: No apparent balance deficits (not formally assessed)                                          ADL Overall ADL's : Needs assistance/impaired Eating/Feeding: Set up;Sitting   Grooming: Set up;Standing;Sitting   Upper Body Bathing: Set up;Sitting   Lower Body Bathing: Supervison/ safety;Sit to/from stand;With adaptive equipment   Upper Body Dressing : Set up;Sitting   Lower Body Dressing: Supervision/safety;Sit to/from stand   Toilet Transfer: Supervision/safety;Ambulation;Comfort height  toilet;Grab bars   Toileting- Clothing Manipulation and Hygiene: Supervision/safety;Sit to/from stand   Tub/ Shower Transfer: Supervision/safety;Ambulation;Shower seat   Functional mobility during ADLs: Supervision/safety General ADL Comments: Pt completed in-room ambulation at supervision level. Discussed compensatory strategies for ADLs and ways to incorporate energy conservation techniques. Discussed AE and sitting to bath/dress. Provided energy conservation handout.      Vision     Perception     Praxis      Pertinent Vitals/Pain Pain Assessment: No/denies pain     Hand Dominance Right   Extremity/Trunk Assessment Upper Extremity Assessment Upper Extremity Assessment: Overall WFL for tasks assessed   Lower Extremity Assessment Lower Extremity Assessment: Defer to PT evaluation       Communication Communication Communication: No difficulties   Cognition Arousal/Alertness: Awake/alert Behavior During Therapy: WFL for tasks assessed/performed Overall Cognitive Status: Within Functional Limits for tasks assessed                     General Comments       Exercises       Shoulder Instructions      Home Living Family/patient expects to be discharged to:: Private residence Living Arrangements: Children;Parent Available Help at Discharge: Family;Available 24 hours/day Type of Home: House Home Access: Stairs to enter [employer] of Steps: 3 Entrance Stairs-Rails: Right Home Layout: One level     Bathroom Shower/Tub: Teacher, early years/pre: Standard     Home Equipment: Cane - single point;Walker - 2 wheels;Bedside commode  Prior Functioning/Environment Level of Independence: Independent with assistive device(s)        Comments: uses cane for ambulating    OT Diagnosis:     OT Problem List:     OT Treatment/Interventions:      OT Goals(Current goals can be found in the care plan section) Acute Rehab OT  Goals Patient Stated Goal: go home  OT Frequency:     Barriers to D/C:            Co-evaluation              End of Session    Activity Tolerance: Patient tolerated treatment well Patient left: in bed;with call bell/phone within reach;with family/visitor present   Time: 1339-1355 OT Time Calculation (min): 16 min Charges:  OT General Charges $OT Visit: 1 Procedure OT Evaluation $Initial OT Evaluation Tier I: 1 Procedure G-Codes: OT G-codes **NOT FOR INPATIENT CLASS** Functional Assessment Tool Used: clinical observation Functional Limitation: Self care Self Care Current Status [id number]): At least 1 percent but less than 20 percent impaired, limited or restricted Self Care Goal Status [id number]): At least 1 percent but less than 20 percent impaired, limited or restricted Self Care Discharge Status [phone]): At least 1 percent but less than 20 percent impaired, limited or restricted  Hortencia Pilar 07/09/2014, 2:04 PM

## 2014-07-09 NOTE — Care Management Note (Addendum)
Case Management Note  Patient Details  Name: Tiffany [name] MRN: [id number] Date of Birth: 31-Jul-1960  Subjective/Objective:                    Action/Plan: PT recommending OP PT Dr Marisa Hua in agreement and will have MD sign referral sheet in shadow chart . Once signed NCM will fax to [facility] location ( patient preference ) , patient will be called with appointment . Patient has contact information for [employer] , confirmed patient's phone number also .   OT recommending shower seat , Dr Marisa Hua will order.   Form completed and faxed   Expected Discharge Date:     07-09-14              Expected Discharge Plan:  Home/Self Care  In-House Referral:     Discharge planning Services  CM Consult, Medication Assistance  Post Acute Care Choice:    Choice offered to:     DME Arranged:    DME Agency:     HH Arranged:    HH Agency:     Status of Service:  In process, will continue to follow  Medicare Important Message Given:    Date Medicare IM Given:    Medicare IM give by:    Date Additional Medicare IM Given:    Additional Medicare Important Message give by:     If discussed at [place] of Stay Meetings, dates discussed:    Additional Comments:  Marilu Favre, RN 07/09/2014, 2:46 PM

## 2014-07-09 NOTE — Discharge Summary (Signed)
[facility] Discharge Summary  Patient name: Tiffany [name] Medical record number: [id number] Date of birth: 1960-12-18 Age: 54 y.o. Gender: female Date of Admission: 07/07/2014  Date of Discharge: 07/09/14 Admitting Physician: Lind Covert, MD  Primary Care Provider: Christa See, MD Consultants: none  Indication for Hospitalization: dyspnea  Discharge Diagnoses/Problem List:  COPD exacerbation  Disposition: Discharge home  Discharge Condition: Stable  Discharge Exam:  Blood pressure 163/83, pulse 73, temperature 98.6 F (37 C), temperature source Oral, resp. rate 18, height 5\' 4"  (1.626 m), weight 252 lb 12.8 oz (114.669 kg), SpO2 93 %. General: awake, alert, well appearing, NAD, son at bedside HEENT: [place]/AT, EOMI, MMM Cardiovascular: RRR, systolic murmur appreciated, +2DP Respiratory: globally decreased breath sounds (likely 2/2 body habitus), no increased WOB, no wheeze, breathing well on RA Abdomen: obese, soft, NT/ND, +BS Extremities: WWP, no edema Neuro: AOx3, follows commands Psych: mood stable, pleasant, speech normal  Brief Hospital Course:  COLIE BAKEMAN is a 54 y.o. female that presented with chronic chest congestion x 3 months with some worsening dyspnea / DOE, failed outpatient therapy for COPD exac. PMH is significant for COPD, ?mixed asthma, active chronic tobacco abuse, OSA (on CPAP), grd 1 diastolic CHF (HFpEF 123456), T2DM (well-controlled), morbid obesity, HLD, HTN, Depression with Anxiety, Chronic Pain Syndrome, reported h/o remote MI (negative cath), h/o seizures.  In the ED, patient had improved on albuterol nebulizer treatments.  She was still dyspneic on ambulation, but improved with O2.  She was admitted for observation.  CXR was unremarkable for acute processes.  Troponin was negative.  BNP 24.4.  WBC slightly elevated to 12.4 but patient had just been on steroids for COPD exacerbation outpatient.  She was  afebrile and otherwise well appearing.  Patient was continued on duoneb treatments and steroids.  Antibiotics were not given, in the setting of recent completion of Levaquin outpatient.  Patient was able to be weaned from O2 on day #1 of hospitalization.  Ambulatory O2 saturation was also obtained and patient did not meet requirements for home O2, saturating in the 90's with ambulation.  Patient reported that she was not using her CPAP because it was in storage.  Attempts to secure a new one were made, but per CM, insurance would not pay for another.  Patient also reported that she was not able to afford her Spiriva.  CM also helped with a voucher for reduced cost of this medication for the next 30 days.  [facility] was also consulted and will work with patient going forward.    Patient was counseled on smoking cessation.  She was amenable to quitting.  Options were discussed with patient and a prescription for nicotine patches was given at discharge.    Patient also assessed by PT/OT in the setting of deconditioning.  Outpatient PT was recommended.  A prescription for this was provided at discharge.  Patient was discharged in stable condition.  Discharge instructions and return precautions were reviewed with patient and her son, who voiced good understanding.   Issues for Follow Up:  1. COPD medication regimen, affordability of Spiriva 2. CPAP machine (reportedly in storage) 3. Smoking cessation.  Nicotine patches rx'd at discharge.  Avoid Buproprion, 2/2 seizure hx.  Consider referal to Dr Valentina Lucks.    Significant Procedures: none  Significant Labs and Imaging:   Recent Labs Lab 07/07/14 1753 07/08/14 0430  WBC 12.4* 16.0*  HGB 11.5* 11.1*  HCT 35.7* 35.6*  PLT 339 345  Recent Labs Lab 07/07/14 1753 07/08/14 0430  NA 140 140  K 3.5 3.4*  CL 105 104  CO2 28 24  GLUCOSE 85 211*  BUN 17 14  CREATININE 1.09* 1.17*  CALCIUM 8.6* 8.7*   Dg Chest 2 View  07/07/2014   CLINICAL DATA:   Cough.  Chest pain for 2 months.  EXAM: CHEST  2 VIEW  COMPARISON:  01/03/2013.  FINDINGS: Stable enlargement of the cardiopericardial silhouette. No failure. No airspace disease. No effusion. Suboptimal evaluation of the anterior mediastinum because the arms are not raised over head. Thoracic spondylosis. Exam degraded by obese body habitus.  IMPRESSION: Stable cardiomegaly without failure.   Electronically Signed   By: Dereck Ligas M.D.   On: 07/07/2014 18:05   Results/Tests Pending at Time of Discharge: none  Discharge Medications:    Medication List    TAKE these medications        albuterol (2.5 MG/3ML) 0.083% nebulizer solution  Commonly known as:  PROVENTIL  INHALE CONTENTS OF 1 VIAL VIA NEBULIZER EVERY 6  HOURS AS NEEDED FOR SHORTNESS OF BREATH.     albuterol 108 (90 BASE) MCG/ACT inhaler  Commonly known as:  PROVENTIL HFA;VENTOLIN HFA  Inhale 2 puffs into the lungs every 6 (six) hours as needed.     ALPRAZolam 1 MG tablet  Commonly known as:  XANAX  Take 1 tablet (1 mg total) by mouth at bedtime as needed for anxiety.     aspirin 81 MG EC tablet  Take 1 tablet (81 mg total) by mouth daily.     atorvastatin 40 MG tablet  Commonly known as:  LIPITOR  Take 1 tablet (40 mg total) by mouth daily.     budesonide-formoterol 160-4.5 MCG/ACT inhaler  Commonly known as:  SYMBICORT  Inhale 2 puffs into the lungs 2 (two) times daily.     cloNIDine 0.2 MG tablet  Commonly known as:  CATAPRES  Take 1 tablet (0.2 mg total) by mouth 3 (three) times daily.     cyclobenzaprine 10 MG tablet  Commonly known as:  FLEXERIL  Take 1 tablet (10 mg total) by mouth 3 (three) times daily as needed for muscle spasms.     fexofenadine 60 MG tablet  Commonly known as:  ALLEGRA  Take 2 tablets (120 mg total) by mouth 2 (two) times daily.     fluticasone 50 MCG/ACT nasal spray  Commonly known as:  FLONASE  Place 2 sprays into both nostrils daily.     furosemide 40 MG tablet  Commonly  known as:  LASIX  Take 1 tablet (40 mg total) by mouth daily.     gabapentin 600 MG tablet  Commonly known as:  NEURONTIN  Take 1 tablet (600 mg total) by mouth 3 (three) times daily.     guaiFENesin 600 MG 12 hr tablet  Commonly known as:  MUCINEX  Take 1 tablet (600 mg total) by mouth 2 (two) times daily.     HYDROcodone-acetaminophen 5-325 MG per tablet  Commonly known as:  NORCO/VICODIN  Take 1 tablet by mouth every 6 (six) hours as needed for moderate pain or severe pain.     metFORMIN 1000 MG (MOD) 24 hr tablet  Commonly known as:  GLUMETZA  Take 1 tablet (1,000 mg total) by mouth daily with breakfast.     montelukast 10 MG tablet  Commonly known as:  SINGULAIR  Take 1 tablet (10 mg total) by mouth at bedtime.     naproxen 500  MG tablet  Commonly known as:  NAPROSYN  Take 1 tablet (500 mg total) by mouth 3 (three) times daily as needed.     nicotine 21 mg/24hr patch  Commonly known as:  NICODERM CQ - dosed in mg/24 hours  Place 1 patch (21 mg total) onto the skin daily.     nystatin 100000 UNIT/ML suspension  Commonly known as:  MYCOSTATIN  TAKE 5 MLS (500,000 UNITS TOTAL) BY MOUTH 4 (FOUR) TIMES DAILY.     omeprazole 40 MG capsule  Commonly known as:  PRILOSEC  Take 1 capsule (40 mg total) by mouth daily.     oxybutynin 5 MG tablet  Commonly known as:  DITROPAN  Take 1 tablet (5 mg total) by mouth 3 (three) times daily.     potassium chloride 10 MEQ tablet  Commonly known as:  K-DUR  Take 2 tablets (20 mEq total) by mouth daily.     predniSONE 50 MG tablet  Commonly known as:  DELTASONE  Take 1 tablet (50 mg total) by mouth daily with breakfast.     ranitidine 150 MG tablet  Commonly known as:  ZANTAC  Take 1 tablet (150 mg total) by mouth 2 (two) times daily.     tiotropium 18 MCG inhalation capsule  Commonly known as:  SPIRIVA HANDIHALER  Place 1 capsule (18 mcg total) into inhaler and inhale daily.     traMADol 50 MG tablet  Commonly known as:   ULTRAM  Take 1-2 tablets (50-100 mg total) by mouth every 12 (twelve) hours as needed. Filled via fax, [place], 09/29/13.     traZODone 150 MG tablet  Commonly known as:  DESYREL  Take 1 tablet (150 mg total) by mouth at bedtime.     verapamil 360 MG 24 hr capsule  Commonly known as:  VERELAN PM  Take 1 capsule (360 mg total) by mouth at bedtime.     ziprasidone 80 MG capsule  Commonly known as:  GEODON  Take 1 capsule (80 mg total) by mouth 2 (two) times daily with a meal.        Discharge Instructions: Please refer to Patient Instructions section of EMR for full details.  Patient was counseled important signs and symptoms that should prompt return to medical care, changes in medications, dietary instructions, activity restrictions, and follow up appointments.   Follow-Up Appointments: Follow-up Information    Follow up with Lavon Paganini, MD. Go on 07/22/2014.   Specialty:  Family Medicine   Why:  2:30pm hospital follow up   Contact information:   [place] [address] [phone]       [name], DO 07/12/2014, 11:05 AM PGY-1, [place]

## 2014-07-09 NOTE — Evaluation (Signed)
Physical Therapy Evaluation and Discharge Patient Details Name: Tiffany [name] MRN: [id number] DOB: 1960-04-25 Today's Date: 07/09/2014   History of Present Illness  54 y.o. female admitted with Acute COPD Exacerbation. PMH is significant for COPD, ?mixed asthma, active chronic tobacco abuse, OSA, grd 1 diastolic CHF, 123456, morbid obesity, HLD, HTN, Depression with Anxiety, Chronic Pain Syndrome, reported h/o remote MI (negative cath), h/o seizures.  Clinical Impression  Patient evaluated by Physical Therapy with no further acute PT needs identified. All education has been completed and the patient has no further questions. SpO2 92% on room air while ambulating, mild dyspnea, educated on energy conservation techniques and pursed lip breathing, as well as signs/symptoms of hypoxia. Reports she is relatively inactive at baseline due to knee pain - would benefit from OP PT to address knee pain and deconditioning. See below for any follow-up Physial Therapy or equipment needs. PT is signing off. Thank you for this referral.     Follow Up Recommendations Outpatient PT (pt very inactive due to BIL knee pain at baseline)    Equipment Recommendations  None recommended by PT    Recommendations for Other Services       Precautions / Restrictions Precautions Precautions: None Restrictions Weight Bearing Restrictions: No      Mobility  Bed Mobility Overal bed mobility: Modified Independent                Transfers Overall transfer level: Needs assistance Equipment used: None Transfers: Sit to/from Stand Sit to Stand: Supervision         General transfer comment: supervision for safety. Mild sway able to self correct.  Ambulation/Gait Ambulation/Gait assistance: Supervision Ambulation Distance (Feet): 150 Feet Assistive device: None Gait Pattern/deviations: Step-through pattern;Decreased stride length;Wide base of support Gait velocity: decreased   General Gait Details:  Mildly antalgic which she reports as baseline. Mild dyspnea. SpO2 92% on room air during bout. Educated on energy conservation techniques and pursed lip breathing. No loss of blance during bout, supervision provided for safety but did not require physical assist.  Stairs Stairs: Yes Stairs assistance: Supervision Stair Management: One rail Right;Step to pattern;Forwards Number of Stairs: 3 General stair comments: Practiced safe stair navigation with use of single rail on Rt. Able to complete without assistance. no loss of balance or LE buckling.  Wheelchair Mobility    Modified Rankin (Stroke Patients Only)       Balance Overall balance assessment: No apparent balance deficits (not formally assessed)                                           Pertinent Vitals/Pain Pain Assessment: No/denies pain    Home Living Family/patient expects to be discharged to:: Private residence Living Arrangements: Children;Parent Available Help at Discharge: Family;Available 24 hours/day Type of Home: House Home Access: Stairs to enter Entrance Stairs-Rails: Right Entrance Stairs-Number of Steps: 3 Home Layout: One level Home Equipment: Cane - single point;Walker - 2 wheels      Prior Function Level of Independence: Independent with assistive device(s)         Comments: uses cane for ambulating     Hand Dominance   Dominant Hand: Right    Extremity/Trunk Assessment   Upper Extremity Assessment: Defer to OT evaluation           Lower Extremity Assessment: Overall WFL for tasks assessed (Reports BIL knee pain  baseline)         Communication   Communication: No difficulties  Cognition Arousal/Alertness: Awake/alert Behavior During Therapy: WFL for tasks assessed/performed Overall Cognitive Status: Within Functional Limits for tasks assessed                      General Comments General comments (skin integrity, edema, etc.): SpO2 92% ambulating  on room air. 95% at rest on room air.    Exercises        Assessment/Plan    PT Assessment Patent does not need any further PT services  PT Diagnosis Abnormality of gait   PT Problem List    PT Treatment Interventions     PT Goals (Current goals can be found in the Care Plan section) Acute Rehab PT Goals Patient Stated Goal: go home PT Goal Formulation: All assessment and education complete, DC therapy    Frequency     Barriers to discharge        Co-evaluation               End of Session   Activity Tolerance: Patient tolerated treatment well Patient left: in bed;with call bell/phone within reach;with family/visitor present Nurse Communication: Mobility status    Functional Assessment Tool Used: clinical observation Functional Limitation: Mobility: Walking and moving around Mobility: Walking and Moving Around Current Status [id number]): At least 1 percent but less than 20 percent impaired, limited or restricted Mobility: Walking and Moving Around Goal Status [phone]): At least 1 percent but less than 20 percent impaired, limited or restricted Mobility: Walking and Moving Around Discharge Status [phone]): At least 1 percent but less than 20 percent impaired, limited or restricted    Time: 1130-1142 PT Time Calculation (min) (ACUTE ONLY): 12 min   Charges:   PT Evaluation $Initial PT Evaluation Tier I: 1 Procedure     PT G Codes:   PT G-Codes **NOT FOR INPATIENT CLASS** Functional Assessment Tool Used: clinical observation Functional Limitation: Mobility: Walking and moving around Mobility: Walking and Moving Around Current Status [id number]): At least 1 percent but less than 20 percent impaired, limited or restricted Mobility: Walking and Moving Around Goal Status [id number]): At least 1 percent but less than 20 percent impaired, limited or restricted Mobility: Walking and Moving Around Discharge Status [phone]): At least 1 percent but less than 20 percent impaired,  limited or restricted    Ellouise Newer 07/09/2014, 1:03 PM  Camille Bal Blain, [place]

## 2014-07-10 ENCOUNTER — Telehealth: Payer: Self-pay | Admitting: *Deleted

## 2014-07-10 NOTE — Telephone Encounter (Signed)
Left message on patient voicemail for patient to contact [facility] [phone]) to schedule her mammogram.

## 2014-07-13 ENCOUNTER — Other Ambulatory Visit: Payer: Self-pay | Admitting: Pharmacist

## 2014-07-13 ENCOUNTER — Other Ambulatory Visit: Payer: Self-pay | Admitting: *Deleted

## 2014-07-13 NOTE — Patient Outreach (Signed)
Referral received from hospital liaison, V. Doug Sou, for assessment of community care manager involvement and engagement in transition of care program.  Member discharged from hospital on 07/09/14 with diagnosis of dyspnea.  Member has history of COPD and is a current every day smoker.  Call placed to member, introduced self and purpose of call.  Identity verified and [facility] care management services explained.  Member reports that she has been doing well since discharge and has had no complications.  She reports that there are some medications that she has not been able to obtain, including Mucinex, Allegra, and Spiriva, and has not taken them since discharge.  Member made aware that the [facility] pharmacist would be contacted concerning her medications.  Member has also not been able to obtain Nicotine patch.  She does state that she has decreased smoking from 1-2 packs/day to smoking 10 cigarettes/day.  Smoking cessation resources discussed.  Member confirms that she has follow up appointment with primary care physician and states that she is able to drive, therefore transportation is not needed for appointment.   Member made aware that weekly transition of care calls would be made over the next several weeks.  Also made aware to contact this care manager with any concerns.  Contact information provided.  Denies any questions at this time.  Initial home visit scheduled for next month.   [name], BSN, [place] [employer]  [facility] Care Manager [phone]

## 2014-07-13 NOTE — Patient Outreach (Signed)
[facility]) Care Management  [place]   07/13/2014  Tiffany [name] [birth year] [id number]  Subjective: Tiffany [name] is a 54 y.o. female who was referred to [place] for medication assistance, specifically for Spiriva.   The patient has [facility] Medicare Part D coverage. Spiriva is a tier 3 medication. [facility]  I looked into the Spiriva patient assistance program. The physician must complete the paperwork with the patient and send in a prescription.   Objective:   Current Medications: Current Outpatient Prescriptions  Medication Sig Dispense Refill  . albuterol (PROVENTIL HFA;VENTOLIN HFA) 108 (90 BASE) MCG/ACT inhaler Inhale 2 puffs into the lungs every 6 (six) hours as needed. 1 Inhaler 6  . albuterol (PROVENTIL) (2.5 MG/3ML) 0.083% nebulizer solution INHALE CONTENTS OF 1 VIAL VIA NEBULIZER EVERY 6  HOURS AS NEEDED FOR SHORTNESS OF BREATH. 90 mL 1  . ALPRAZolam (XANAX) 1 MG tablet Take 1 tablet (1 mg total) by mouth at bedtime as needed for anxiety. (Patient not taking: Reported on 07/07/2014) 60 tablet 1  . aspirin EC 81 MG EC tablet Take 1 tablet (81 mg total) by mouth daily. 30 tablet 0  . atorvastatin (LIPITOR) 40 MG tablet Take 1 tablet (40 mg total) by mouth daily. 90 tablet 4  . budesonide-formoterol (SYMBICORT) 160-4.5 MCG/ACT inhaler Inhale 2 puffs into the lungs 2 (two) times daily. 1 Inhaler 6  . cloNIDine (CATAPRES) 0.2 MG tablet Take 1 tablet (0.2 mg total) by mouth 3 (three) times daily. 270 tablet 3  . cyclobenzaprine (FLEXERIL) 10 MG tablet Take 1 tablet (10 mg total) by mouth 3 (three) times daily as needed for muscle spasms. 60 tablet 2  . fexofenadine (ALLEGRA) 60 MG tablet Take 2 tablets (120 mg total) by mouth 2 (two) times daily. 90 tablet 4  . fluticasone (FLONASE) 50 MCG/ACT nasal spray Place 2 sprays into both nostrils daily. 16 g 3  . furosemide (LASIX) 40 MG tablet Take 1 tablet (40 mg total) by mouth  daily. 90 tablet 3  . gabapentin (NEURONTIN) 600 MG tablet Take 1 tablet (600 mg total) by mouth 3 (three) times daily. 270 tablet 4  . guaiFENesin (MUCINEX) 600 MG 12 hr tablet Take 1 tablet (600 mg total) by mouth 2 (two) times daily. 14 tablet 0  . HYDROcodone-acetaminophen (NORCO/VICODIN) 5-325 MG per tablet Take 1 tablet by mouth every 6 (six) hours as needed for moderate pain or severe pain. 30 tablet 0  . metFORMIN (GLUMETZA) 1000 MG (MOD) 24 hr tablet Take 1 tablet (1,000 mg total) by mouth daily with breakfast. 90 tablet 4  . montelukast (SINGULAIR) 10 MG tablet Take 1 tablet (10 mg total) by mouth at bedtime. 90 tablet 3  . naproxen (NAPROSYN) 500 MG tablet Take 1 tablet (500 mg total) by mouth 3 (three) times daily as needed. 270 tablet 3  . nicotine (NICODERM CQ - DOSED IN MG/24 HOURS) 21 mg/24hr patch Place 1 patch (21 mg total) onto the skin daily. 28 patch 0  . nystatin (MYCOSTATIN) 100000 UNIT/ML suspension TAKE 5 MLS (500,000 UNITS TOTAL) BY MOUTH 4 (FOUR) TIMES DAILY. 473 mL 1  . omeprazole (PRILOSEC) 40 MG capsule Take 1 capsule (40 mg total) by mouth daily. 90 capsule 1  . oxybutynin (DITROPAN) 5 MG tablet Take 1 tablet (5 mg total) by mouth 3 (three) times daily. 270 tablet 3  . potassium chloride (K-DUR) 10 MEQ tablet Take 2 tablets (20 mEq total) by mouth  daily. 180 tablet 2  . predniSONE (DELTASONE) 50 MG tablet Take 1 tablet (50 mg total) by mouth daily with breakfast. 3 tablet 0  . ranitidine (ZANTAC) 150 MG tablet Take 1 tablet (150 mg total) by mouth 2 (two) times daily. 60 tablet 1  . tiotropium (SPIRIVA HANDIHALER) 18 MCG inhalation capsule Place 1 capsule (18 mcg total) into inhaler and inhale daily. 90 capsule 1  . traMADol (ULTRAM) 50 MG tablet Take 1-2 tablets (50-100 mg total) by mouth every 12 (twelve) hours as needed. Filled via fax, [place], 09/29/13. (Patient not taking: Reported on 07/07/2014) 270 tablet 0  . traZODone (DESYREL) 150 MG tablet Take 1  tablet (150 mg total) by mouth at bedtime. 90 tablet 2  . verapamil (VERELAN PM) 360 MG 24 hr capsule Take 1 capsule (360 mg total) by mouth at bedtime. 90 capsule 4  . ziprasidone (GEODON) 80 MG capsule Take 1 capsule (80 mg total) by mouth 2 (two) times daily with a meal. 180 capsule 3   No current facility-administered medications for this visit.    Functional Status: In your present state of health, do you have any difficulty performing the following activities: 07/08/2014  Hearing? N  Vision? N  Difficulty concentrating or making decisions? N  Walking or climbing stairs? N  Dressing or bathing? N  Doing errands, shopping? N    Fall/Depression Screening: PHQ 2/9 Scores 06/26/2014 05/21/2014 12/31/2013 12/31/2013  PHQ - 2 Score 0 0 0 0    Assessment: 1. Medication Assistance: unable to assist with the Spiriva application but will inform Dr. Lorenso Courier who will be the primary care physician for this patient starting July 1. 2. Mail order: [facility] but by switching to [employer], would likely save a lot of money due to the $0 copay on preferred generics.   Plan: 1. Medication Assistance: will send inbox message to Dr. Lorenso Courier about the [facility] application as well as Dr. Valentina Lucks, the clinic pharmacist. I called the patient to discuss this and I had to leave a HIPAA compliant message for patient to return my phone call.  I will reach out on 07/14/14 if the patient does not return my call today.  2. Mail order: will follow up once patient calls me back.   Nicoletta Ba, PharmD, [place] Resident [place] [phone]

## 2014-07-14 ENCOUNTER — Telehealth: Payer: Self-pay | Admitting: Pharmacist

## 2014-07-14 NOTE — Patient Outreach (Addendum)
[facility]) Care Management  [place]   07/14/2014  Tiffany [name] 1960-11-22 [id number]  Subjective: Tiffany [name] is a 54 y.o. female who was referred to [employer] for medication assistance, specifically for Spiriva.   I called the patient today and she confirmed that she is having difficulty affording her Spiriva.   The patient has [facility] Medicare Part D coverage. Spiriva is a tier 3 medication. [facility] but reports that she already gets medications mailed to her from [place].   Objective:   Current Medications: Current Outpatient Prescriptions  Medication Sig Dispense Refill  . albuterol (PROVENTIL HFA;VENTOLIN HFA) 108 (90 BASE) MCG/ACT inhaler Inhale 2 puffs into the lungs every 6 (six) hours as needed. 1 Inhaler 6  . albuterol (PROVENTIL) (2.5 MG/3ML) 0.083% nebulizer solution INHALE CONTENTS OF 1 VIAL VIA NEBULIZER EVERY 6  HOURS AS NEEDED FOR SHORTNESS OF BREATH. 90 mL 1  . ALPRAZolam (XANAX) 1 MG tablet Take 1 tablet (1 mg total) by mouth at bedtime as needed for anxiety. (Patient not taking: Reported on 07/07/2014) 60 tablet 1  . aspirin EC 81 MG EC tablet Take 1 tablet (81 mg total) by mouth daily. 30 tablet 0  . atorvastatin (LIPITOR) 40 MG tablet Take 1 tablet (40 mg total) by mouth daily. (Patient not taking: Reported on 07/13/2014) 90 tablet 4  . budesonide-formoterol (SYMBICORT) 160-4.5 MCG/ACT inhaler Inhale 2 puffs into the lungs 2 (two) times daily. 1 Inhaler 6  . cloNIDine (CATAPRES) 0.2 MG tablet Take 1 tablet (0.2 mg total) by mouth 3 (three) times daily. 270 tablet 3  . cyclobenzaprine (FLEXERIL) 10 MG tablet Take 1 tablet (10 mg total) by mouth 3 (three) times daily as needed for muscle spasms. 60 tablet 2  . fexofenadine (ALLEGRA) 60 MG tablet Take 2 tablets (120 mg total) by mouth 2 (two) times daily. (Patient not taking: Reported on 07/13/2014) 90 tablet 4  . fluticasone (FLONASE) 50 MCG/ACT nasal spray  Place 2 sprays into both nostrils daily. (Patient not taking: Reported on 07/13/2014) 16 g 3  . furosemide (LASIX) 40 MG tablet Take 1 tablet (40 mg total) by mouth daily. 90 tablet 3  . gabapentin (NEURONTIN) 600 MG tablet Take 1 tablet (600 mg total) by mouth 3 (three) times daily. 270 tablet 4  . guaiFENesin (MUCINEX) 600 MG 12 hr tablet Take 1 tablet (600 mg total) by mouth 2 (two) times daily. (Patient not taking: Reported on 07/13/2014) 14 tablet 0  . HYDROcodone-acetaminophen (NORCO/VICODIN) 5-325 MG per tablet Take 1 tablet by mouth every 6 (six) hours as needed for moderate pain or severe pain. 30 tablet 0  . metFORMIN (GLUMETZA) 1000 MG (MOD) 24 hr tablet Take 1 tablet (1,000 mg total) by mouth daily with breakfast. 90 tablet 4  . montelukast (SINGULAIR) 10 MG tablet Take 1 tablet (10 mg total) by mouth at bedtime. 90 tablet 3  . naproxen (NAPROSYN) 500 MG tablet Take 1 tablet (500 mg total) by mouth 3 (three) times daily as needed. 270 tablet 3  . nicotine (NICODERM CQ - DOSED IN MG/24 HOURS) 21 mg/24hr patch Place 1 patch (21 mg total) onto the skin daily. (Patient not taking: Reported on 07/13/2014) 28 patch 0  . nystatin (MYCOSTATIN) 100000 UNIT/ML suspension TAKE 5 MLS (500,000 UNITS TOTAL) BY MOUTH 4 (FOUR) TIMES DAILY. 473 mL 1  . omeprazole (PRILOSEC) 40 MG capsule Take 1 capsule (40 mg total) by mouth daily. 90 capsule 1  .  oxybutynin (DITROPAN) 5 MG tablet Take 1 tablet (5 mg total) by mouth 3 (three) times daily. 270 tablet 3  . potassium chloride (K-DUR) 10 MEQ tablet Take 2 tablets (20 mEq total) by mouth daily. 180 tablet 2  . predniSONE (DELTASONE) 50 MG tablet Take 1 tablet (50 mg total) by mouth daily with breakfast. 3 tablet 0  . ranitidine (ZANTAC) 150 MG tablet Take 1 tablet (150 mg total) by mouth 2 (two) times daily. (Patient not taking: Reported on 07/13/2014) 60 tablet 1  . tiotropium (SPIRIVA HANDIHALER) 18 MCG inhalation capsule Place 1 capsule (18 mcg total) into  inhaler and inhale daily. (Patient not taking: Reported on 07/13/2014) 90 capsule 1  . traMADol (ULTRAM) 50 MG tablet Take 1-2 tablets (50-100 mg total) by mouth every 12 (twelve) hours as needed. Filled via fax, [place], 09/29/13. (Patient not taking: Reported on 07/07/2014) 270 tablet 0  . traZODone (DESYREL) 150 MG tablet Take 1 tablet (150 mg total) by mouth at bedtime. 90 tablet 2  . verapamil (VERELAN PM) 360 MG 24 hr capsule Take 1 capsule (360 mg total) by mouth at bedtime. 90 capsule 4  . ziprasidone (GEODON) 80 MG capsule Take 1 capsule (80 mg total) by mouth 2 (two) times daily with a meal. 180 capsule 3   No current facility-administered medications for this visit.    Functional Status: In your present state of health, do you have any difficulty performing the following activities: 07/08/2014  Hearing? N  Vision? N  Difficulty concentrating or making decisions? N  Walking or climbing stairs? N  Dressing or bathing? N  Doing errands, shopping? N    Fall/Depression Screening: PHQ 2/9 Scores 06/26/2014 05/21/2014 12/31/2013 12/31/2013  PHQ - 2 Score 0 0 0 0    Assessment: 1. Medication Assistance: unable to assist with the Spiriva application but patient is aware that she can fill it out with his physician. 2. Mail order: patient currently uses mail order  Plan: 1. Medication Assistance: I have already sent a message to Dr. Lorenso Courier and Dr. Valentina Lucks to assist patient with the Spiriva patient assistance application. Patient is aware and will follow up with her physician. Will sign out of pharmacy program as patient denies any need for further assistance.  2. Mail order: no recommendations for intervention.    Nicoletta Ba, PharmD, [place] Resident [place] [phone]

## 2014-07-15 ENCOUNTER — Telehealth: Payer: Self-pay | Admitting: Pharmacist

## 2014-07-15 NOTE — Patient Outreach (Signed)
[facility]) Care Management  [place]   07/15/2014  ARLYNE EHLER 03-Jan-1961 [id number]  Subjective: Tiffany [name] is a 54 y.o. female who was referred to pharmacy for medication assistance with Spiriva.  I received a message from [place], that patient reports also needing assistance with nicotine patch, Allegra, and Mucinex. Also, patient reported to her that she was taking lorazepam but this is not on her medication list.   Objective:   Current Medications: Current Outpatient Prescriptions  Medication Sig Dispense Refill  . albuterol (PROVENTIL HFA;VENTOLIN HFA) 108 (90 BASE) MCG/ACT inhaler Inhale 2 puffs into the lungs every 6 (six) hours as needed. 1 Inhaler 6  . albuterol (PROVENTIL) (2.5 MG/3ML) 0.083% nebulizer solution INHALE CONTENTS OF 1 VIAL VIA NEBULIZER EVERY 6  HOURS AS NEEDED FOR SHORTNESS OF BREATH. 90 mL 1  . ALPRAZolam (XANAX) 1 MG tablet Take 1 tablet (1 mg total) by mouth at bedtime as needed for anxiety. (Patient not taking: Reported on 07/07/2014) 60 tablet 1  . aspirin EC 81 MG EC tablet Take 1 tablet (81 mg total) by mouth daily. 30 tablet 0  . atorvastatin (LIPITOR) 40 MG tablet Take 1 tablet (40 mg total) by mouth daily. (Patient not taking: Reported on 07/13/2014) 90 tablet 4  . budesonide-formoterol (SYMBICORT) 160-4.5 MCG/ACT inhaler Inhale 2 puffs into the lungs 2 (two) times daily. 1 Inhaler 6  . cloNIDine (CATAPRES) 0.2 MG tablet Take 1 tablet (0.2 mg total) by mouth 3 (three) times daily. 270 tablet 3  . cyclobenzaprine (FLEXERIL) 10 MG tablet Take 1 tablet (10 mg total) by mouth 3 (three) times daily as needed for muscle spasms. 60 tablet 2  . fexofenadine (ALLEGRA) 60 MG tablet Take 2 tablets (120 mg total) by mouth 2 (two) times daily. (Patient not taking: Reported on 07/13/2014) 90 tablet 4  . fluticasone (FLONASE) 50 MCG/ACT nasal spray Place 2 sprays into both nostrils daily. (Patient not taking: Reported on  07/13/2014) 16 g 3  . furosemide (LASIX) 40 MG tablet Take 1 tablet (40 mg total) by mouth daily. 90 tablet 3  . gabapentin (NEURONTIN) 600 MG tablet Take 1 tablet (600 mg total) by mouth 3 (three) times daily. 270 tablet 4  . guaiFENesin (MUCINEX) 600 MG 12 hr tablet Take 1 tablet (600 mg total) by mouth 2 (two) times daily. (Patient not taking: Reported on 07/13/2014) 14 tablet 0  . HYDROcodone-acetaminophen (NORCO/VICODIN) 5-325 MG per tablet Take 1 tablet by mouth every 6 (six) hours as needed for moderate pain or severe pain. 30 tablet 0  . metFORMIN (GLUMETZA) 1000 MG (MOD) 24 hr tablet Take 1 tablet (1,000 mg total) by mouth daily with breakfast. 90 tablet 4  . montelukast (SINGULAIR) 10 MG tablet Take 1 tablet (10 mg total) by mouth at bedtime. 90 tablet 3  . naproxen (NAPROSYN) 500 MG tablet Take 1 tablet (500 mg total) by mouth 3 (three) times daily as needed. 270 tablet 3  . nicotine (NICODERM CQ - DOSED IN MG/24 HOURS) 21 mg/24hr patch Place 1 patch (21 mg total) onto the skin daily. (Patient not taking: Reported on 07/13/2014) 28 patch 0  . nystatin (MYCOSTATIN) 100000 UNIT/ML suspension TAKE 5 MLS (500,000 UNITS TOTAL) BY MOUTH 4 (FOUR) TIMES DAILY. 473 mL 1  . omeprazole (PRILOSEC) 40 MG capsule Take 1 capsule (40 mg total) by mouth daily. 90 capsule 1  . oxybutynin (DITROPAN) 5 MG tablet Take 1 tablet (5 mg total) by mouth  3 (three) times daily. 270 tablet 3  . potassium chloride (K-DUR) 10 MEQ tablet Take 2 tablets (20 mEq total) by mouth daily. 180 tablet 2  . predniSONE (DELTASONE) 50 MG tablet Take 1 tablet (50 mg total) by mouth daily with breakfast. 3 tablet 0  . ranitidine (ZANTAC) 150 MG tablet Take 1 tablet (150 mg total) by mouth 2 (two) times daily. (Patient not taking: Reported on 07/13/2014) 60 tablet 1  . tiotropium (SPIRIVA HANDIHALER) 18 MCG inhalation capsule Place 1 capsule (18 mcg total) into inhaler and inhale daily. (Patient not taking: Reported on 07/13/2014) 90  capsule 1  . traMADol (ULTRAM) 50 MG tablet Take 1-2 tablets (50-100 mg total) by mouth every 12 (twelve) hours as needed. Filled via fax, [place], 09/29/13. (Patient not taking: Reported on 07/07/2014) 270 tablet 0  . traZODone (DESYREL) 150 MG tablet Take 1 tablet (150 mg total) by mouth at bedtime. 90 tablet 2  . verapamil (VERELAN PM) 360 MG 24 hr capsule Take 1 capsule (360 mg total) by mouth at bedtime. 90 capsule 4  . ziprasidone (GEODON) 80 MG capsule Take 1 capsule (80 mg total) by mouth 2 (two) times daily with a meal. 180 capsule 3   No current facility-administered medications for this visit.    Functional Status: In your present state of health, do you have any difficulty performing the following activities: 07/08/2014  Hearing? N  Vision? N  Difficulty concentrating or making decisions? N  Walking or climbing stairs? N  Dressing or bathing? N  Doing errands, shopping? N    Fall/Depression Screening: PHQ 2/9 Scores 06/26/2014 05/21/2014 12/31/2013 12/31/2013  PHQ - 2 Score 0 0 0 0    Assessment: 1. Medication assistance: there is no assistance available for Allegra or Mucinex. Both are available over the counter and in generic formulations. The patient can try to get free patches through 1-800-QUIT-NOW but there is no guarantee that they are available.  2. Medication list discrepancy: patient reports being on lorazepam but alprazolam is on medication list. Unsure if the patient was confused or if she also has lorazepam and is getting it from a provider outside of [facility]. Will further assess once I am able to get in touch with patient.   Plan: 1. I called the patient to discuss the above and I had to leave a HIPAA compliant message for patient to return my phone call.  I will reach out on 07/16/14 if the patient does not return my call today.   Nicoletta Ba, PharmD, [place] Resident [place] [phone]

## 2014-07-16 ENCOUNTER — Telehealth: Payer: Self-pay | Admitting: Pharmacist

## 2014-07-16 NOTE — Patient Outreach (Signed)
[facility]) Care Management  [place]   07/16/2014  KYESHA CARBINE 1960/05/20 [id number]  Subjective: Tiffany [name] is a 54 y.o. female who was referred to pharmacy for medication assistance with Spiriva.  I received a message from [place], that patient reports also needing assistance with nicotine patch, Allegra, and Mucinex. Also, patient reported to her that she was taking lorazepam but this is not on her medication list.   Objective:   Current Medications: Current Outpatient Prescriptions  Medication Sig Dispense Refill  . albuterol (PROVENTIL HFA;VENTOLIN HFA) 108 (90 BASE) MCG/ACT inhaler Inhale 2 puffs into the lungs every 6 (six) hours as needed. 1 Inhaler 6  . albuterol (PROVENTIL) (2.5 MG/3ML) 0.083% nebulizer solution INHALE CONTENTS OF 1 VIAL VIA NEBULIZER EVERY 6  HOURS AS NEEDED FOR SHORTNESS OF BREATH. 90 mL 1  . ALPRAZolam (XANAX) 1 MG tablet Take 1 tablet (1 mg total) by mouth at bedtime as needed for anxiety. (Patient not taking: Reported on 07/07/2014) 60 tablet 1  . aspirin EC 81 MG EC tablet Take 1 tablet (81 mg total) by mouth daily. 30 tablet 0  . atorvastatin (LIPITOR) 40 MG tablet Take 1 tablet (40 mg total) by mouth daily. (Patient not taking: Reported on 07/13/2014) 90 tablet 4  . budesonide-formoterol (SYMBICORT) 160-4.5 MCG/ACT inhaler Inhale 2 puffs into the lungs 2 (two) times daily. 1 Inhaler 6  . cloNIDine (CATAPRES) 0.2 MG tablet Take 1 tablet (0.2 mg total) by mouth 3 (three) times daily. 270 tablet 3  . cyclobenzaprine (FLEXERIL) 10 MG tablet Take 1 tablet (10 mg total) by mouth 3 (three) times daily as needed for muscle spasms. 60 tablet 2  . fexofenadine (ALLEGRA) 60 MG tablet Take 2 tablets (120 mg total) by mouth 2 (two) times daily. (Patient not taking: Reported on 07/13/2014) 90 tablet 4  . fluticasone (FLONASE) 50 MCG/ACT nasal spray Place 2 sprays into both nostrils daily. (Patient not taking: Reported on  07/13/2014) 16 g 3  . furosemide (LASIX) 40 MG tablet Take 1 tablet (40 mg total) by mouth daily. 90 tablet 3  . gabapentin (NEURONTIN) 600 MG tablet Take 1 tablet (600 mg total) by mouth 3 (three) times daily. 270 tablet 4  . guaiFENesin (MUCINEX) 600 MG 12 hr tablet Take 1 tablet (600 mg total) by mouth 2 (two) times daily. (Patient not taking: Reported on 07/13/2014) 14 tablet 0  . HYDROcodone-acetaminophen (NORCO/VICODIN) 5-325 MG per tablet Take 1 tablet by mouth every 6 (six) hours as needed for moderate pain or severe pain. 30 tablet 0  . metFORMIN (GLUMETZA) 1000 MG (MOD) 24 hr tablet Take 1 tablet (1,000 mg total) by mouth daily with breakfast. 90 tablet 4  . montelukast (SINGULAIR) 10 MG tablet Take 1 tablet (10 mg total) by mouth at bedtime. 90 tablet 3  . naproxen (NAPROSYN) 500 MG tablet Take 1 tablet (500 mg total) by mouth 3 (three) times daily as needed. 270 tablet 3  . nicotine (NICODERM CQ - DOSED IN MG/24 HOURS) 21 mg/24hr patch Place 1 patch (21 mg total) onto the skin daily. (Patient not taking: Reported on 07/13/2014) 28 patch 0  . nystatin (MYCOSTATIN) 100000 UNIT/ML suspension TAKE 5 MLS (500,000 UNITS TOTAL) BY MOUTH 4 (FOUR) TIMES DAILY. 473 mL 1  . omeprazole (PRILOSEC) 40 MG capsule Take 1 capsule (40 mg total) by mouth daily. 90 capsule 1  . oxybutynin (DITROPAN) 5 MG tablet Take 1 tablet (5 mg total) by mouth  3 (three) times daily. 270 tablet 3  . potassium chloride (K-DUR) 10 MEQ tablet Take 2 tablets (20 mEq total) by mouth daily. 180 tablet 2  . predniSONE (DELTASONE) 50 MG tablet Take 1 tablet (50 mg total) by mouth daily with breakfast. 3 tablet 0  . ranitidine (ZANTAC) 150 MG tablet Take 1 tablet (150 mg total) by mouth 2 (two) times daily. (Patient not taking: Reported on 07/13/2014) 60 tablet 1  . tiotropium (SPIRIVA HANDIHALER) 18 MCG inhalation capsule Place 1 capsule (18 mcg total) into inhaler and inhale daily. (Patient not taking: Reported on 07/13/2014) 90  capsule 1  . traMADol (ULTRAM) 50 MG tablet Take 1-2 tablets (50-100 mg total) by mouth every 12 (twelve) hours as needed. Filled via fax, [place], 09/29/13. (Patient not taking: Reported on 07/07/2014) 270 tablet 0  . traZODone (DESYREL) 150 MG tablet Take 1 tablet (150 mg total) by mouth at bedtime. 90 tablet 2  . verapamil (VERELAN PM) 360 MG 24 hr capsule Take 1 capsule (360 mg total) by mouth at bedtime. 90 capsule 4  . ziprasidone (GEODON) 80 MG capsule Take 1 capsule (80 mg total) by mouth 2 (two) times daily with a meal. 180 capsule 3   No current facility-administered medications for this visit.    Functional Status: In your present state of health, do you have any difficulty performing the following activities: 07/08/2014  Hearing? N  Vision? N  Difficulty concentrating or making decisions? N  Walking or climbing stairs? N  Dressing or bathing? N  Doing errands, shopping? N    Fall/Depression Screening: PHQ 2/9 Scores 06/26/2014 05/21/2014 12/31/2013 12/31/2013  PHQ - 2 Score 0 0 0 0    Assessment: 1. Medication assistance: there is no assistance available for Allegra or Mucinex. Both are available over the counter and in generic formulations. The patient can try to get free patches through 1-800-QUIT-NOW but there is no guarantee that they are available.  2. Medication list discrepancy: patient reports being on lorazepam but alprazolam is on medication list. Unsure if the patient was confused or if she also has lorazepam and is getting it from a provider outside of [facility]. Will further assess once I am able to get in touch with patient.   Plan: 1. I called the patient to discuss the above and I had to leave a HIPAA compliant message for patient to return my phone call.  I will reach out on 07/17/14 if the patient does not return my call today.   Nicoletta Ba, PharmD, [place] Resident [place] [phone]

## 2014-07-17 ENCOUNTER — Telehealth: Payer: Self-pay | Admitting: Pharmacist

## 2014-07-17 NOTE — Patient Outreach (Signed)
[facility]) Care Management  [place]   07/17/2014  BRYNNLEY KHANG March 27, 1960 [id number]  Subjective: Tiffany [name] is a 54 y.o. female who was referred to pharmacy for medication assistance with Spiriva.  I received a message from [place], that patient reports also needing assistance with nicotine patch, Allegra, and Mucinex. Also, patient reported to her that she was taking lorazepam but this is not on her medication list.   Objective:   Current Medications: Current Outpatient Prescriptions  Medication Sig Dispense Refill  . albuterol (PROVENTIL HFA;VENTOLIN HFA) 108 (90 BASE) MCG/ACT inhaler Inhale 2 puffs into the lungs every 6 (six) hours as needed. 1 Inhaler 6  . albuterol (PROVENTIL) (2.5 MG/3ML) 0.083% nebulizer solution INHALE CONTENTS OF 1 VIAL VIA NEBULIZER EVERY 6  HOURS AS NEEDED FOR SHORTNESS OF BREATH. 90 mL 1  . ALPRAZolam (XANAX) 1 MG tablet Take 1 tablet (1 mg total) by mouth at bedtime as needed for anxiety. (Patient not taking: Reported on 07/07/2014) 60 tablet 1  . aspirin EC 81 MG EC tablet Take 1 tablet (81 mg total) by mouth daily. 30 tablet 0  . atorvastatin (LIPITOR) 40 MG tablet Take 1 tablet (40 mg total) by mouth daily. (Patient not taking: Reported on 07/13/2014) 90 tablet 4  . budesonide-formoterol (SYMBICORT) 160-4.5 MCG/ACT inhaler Inhale 2 puffs into the lungs 2 (two) times daily. 1 Inhaler 6  . cloNIDine (CATAPRES) 0.2 MG tablet Take 1 tablet (0.2 mg total) by mouth 3 (three) times daily. 270 tablet 3  . cyclobenzaprine (FLEXERIL) 10 MG tablet Take 1 tablet (10 mg total) by mouth 3 (three) times daily as needed for muscle spasms. 60 tablet 2  . fexofenadine (ALLEGRA) 60 MG tablet Take 2 tablets (120 mg total) by mouth 2 (two) times daily. (Patient not taking: Reported on 07/13/2014) 90 tablet 4  . fluticasone (FLONASE) 50 MCG/ACT nasal spray Place 2 sprays into both nostrils daily. (Patient not taking: Reported on  07/13/2014) 16 g 3  . furosemide (LASIX) 40 MG tablet Take 1 tablet (40 mg total) by mouth daily. 90 tablet 3  . gabapentin (NEURONTIN) 600 MG tablet Take 1 tablet (600 mg total) by mouth 3 (three) times daily. 270 tablet 4  . guaiFENesin (MUCINEX) 600 MG 12 hr tablet Take 1 tablet (600 mg total) by mouth 2 (two) times daily. (Patient not taking: Reported on 07/13/2014) 14 tablet 0  . HYDROcodone-acetaminophen (NORCO/VICODIN) 5-325 MG per tablet Take 1 tablet by mouth every 6 (six) hours as needed for moderate pain or severe pain. 30 tablet 0  . metFORMIN (GLUMETZA) 1000 MG (MOD) 24 hr tablet Take 1 tablet (1,000 mg total) by mouth daily with breakfast. 90 tablet 4  . montelukast (SINGULAIR) 10 MG tablet Take 1 tablet (10 mg total) by mouth at bedtime. 90 tablet 3  . naproxen (NAPROSYN) 500 MG tablet Take 1 tablet (500 mg total) by mouth 3 (three) times daily as needed. 270 tablet 3  . nicotine (NICODERM CQ - DOSED IN MG/24 HOURS) 21 mg/24hr patch Place 1 patch (21 mg total) onto the skin daily. (Patient not taking: Reported on 07/13/2014) 28 patch 0  . nystatin (MYCOSTATIN) 100000 UNIT/ML suspension TAKE 5 MLS (500,000 UNITS TOTAL) BY MOUTH 4 (FOUR) TIMES DAILY. 473 mL 1  . omeprazole (PRILOSEC) 40 MG capsule Take 1 capsule (40 mg total) by mouth daily. 90 capsule 1  . oxybutynin (DITROPAN) 5 MG tablet Take 1 tablet (5 mg total) by mouth  3 (three) times daily. 270 tablet 3  . potassium chloride (K-DUR) 10 MEQ tablet Take 2 tablets (20 mEq total) by mouth daily. 180 tablet 2  . predniSONE (DELTASONE) 50 MG tablet Take 1 tablet (50 mg total) by mouth daily with breakfast. 3 tablet 0  . ranitidine (ZANTAC) 150 MG tablet Take 1 tablet (150 mg total) by mouth 2 (two) times daily. (Patient not taking: Reported on 07/13/2014) 60 tablet 1  . tiotropium (SPIRIVA HANDIHALER) 18 MCG inhalation capsule Place 1 capsule (18 mcg total) into inhaler and inhale daily. (Patient not taking: Reported on 07/13/2014) 90  capsule 1  . traMADol (ULTRAM) 50 MG tablet Take 1-2 tablets (50-100 mg total) by mouth every 12 (twelve) hours as needed. Filled via fax, [place], 09/29/13. (Patient not taking: Reported on 07/07/2014) 270 tablet 0  . traZODone (DESYREL) 150 MG tablet Take 1 tablet (150 mg total) by mouth at bedtime. 90 tablet 2  . verapamil (VERELAN PM) 360 MG 24 hr capsule Take 1 capsule (360 mg total) by mouth at bedtime. 90 capsule 4  . ziprasidone (GEODON) 80 MG capsule Take 1 capsule (80 mg total) by mouth 2 (two) times daily with a meal. 180 capsule 3   No current facility-administered medications for this visit.    Functional Status: In your present state of health, do you have any difficulty performing the following activities: 07/08/2014  Hearing? N  Vision? N  Difficulty concentrating or making decisions? N  Walking or climbing stairs? N  Dressing or bathing? N  Doing errands, shopping? N    Fall/Depression Screening: PHQ 2/9 Scores 06/26/2014 05/21/2014 12/31/2013 12/31/2013  PHQ - 2 Score 0 0 0 0    Assessment: 1. Medication assistance: there is no assistance available for Allegra or Mucinex. Both are available over the counter and in generic formulations. The patient can try to get free patches through 1-800-QUIT-NOW but there is no guarantee that they are available.  2. Medication list discrepancy: patient reports being on lorazepam but alprazolam is on medication list. Unsure if the patient was confused or if she also has lorazepam and is getting it from a provider outside of [facility]. Will further assess once I am able to get in touch with patient.   Plan: 1. I called the patient to discuss the above and I had to leave a HIPAA compliant message for patient to return my phone call.  I will reach out on 07/21/14 if the patient does not return my call today.   Nicoletta Ba, PharmD, [place] Resident [place] [phone]

## 2014-07-21 ENCOUNTER — Telehealth: Payer: Self-pay | Admitting: Pharmacist

## 2014-07-21 NOTE — Patient Outreach (Signed)
[facility]) Care Management  [place]   07/21/2014  Tiffany [name] 07-Oct-1960 [id number]  Subjective: Tiffany [name] is a 54 y.o. female who was referred to pharmacy for medication assistance with Spiriva.  I received a message from [place], that patient reports also needing assistance with nicotine patch, Allegra, and Mucinex. Also, patient reported to her that she was taking lorazepam but this is not on her medication list.   Patient reports that she is taking the lorazepam. It was prescribed to her from a physician in [place]. She reports that Dr. Venetia Maxon has been prescribing that for her now though there is no record of this.   Patient reports using 1-800-QUIT-NOW in the past.  Objective:   Current Medications: Current Outpatient Prescriptions  Medication Sig Dispense Refill  . albuterol (PROVENTIL HFA;VENTOLIN HFA) 108 (90 BASE) MCG/ACT inhaler Inhale 2 puffs into the lungs every 6 (six) hours as needed. 1 Inhaler 6  . albuterol (PROVENTIL) (2.5 MG/3ML) 0.083% nebulizer solution INHALE CONTENTS OF 1 VIAL VIA NEBULIZER EVERY 6  HOURS AS NEEDED FOR SHORTNESS OF BREATH. 90 mL 1  . ALPRAZolam (XANAX) 1 MG tablet Take 1 tablet (1 mg total) by mouth at bedtime as needed for anxiety. (Patient not taking: Reported on 07/07/2014) 60 tablet 1  . aspirin EC 81 MG EC tablet Take 1 tablet (81 mg total) by mouth daily. 30 tablet 0  . atorvastatin (LIPITOR) 40 MG tablet Take 1 tablet (40 mg total) by mouth daily. (Patient not taking: Reported on 07/13/2014) 90 tablet 4  . budesonide-formoterol (SYMBICORT) 160-4.5 MCG/ACT inhaler Inhale 2 puffs into the lungs 2 (two) times daily. 1 Inhaler 6  . cloNIDine (CATAPRES) 0.2 MG tablet Take 1 tablet (0.2 mg total) by mouth 3 (three) times daily. 270 tablet 3  . cyclobenzaprine (FLEXERIL) 10 MG tablet Take 1 tablet (10 mg total) by mouth 3 (three) times daily as needed for muscle spasms. 60 tablet 2  . fexofenadine  (ALLEGRA) 60 MG tablet Take 2 tablets (120 mg total) by mouth 2 (two) times daily. (Patient not taking: Reported on 07/13/2014) 90 tablet 4  . fluticasone (FLONASE) 50 MCG/ACT nasal spray Place 2 sprays into both nostrils daily. (Patient not taking: Reported on 07/13/2014) 16 g 3  . furosemide (LASIX) 40 MG tablet Take 1 tablet (40 mg total) by mouth daily. 90 tablet 3  . gabapentin (NEURONTIN) 600 MG tablet Take 1 tablet (600 mg total) by mouth 3 (three) times daily. 270 tablet 4  . guaiFENesin (MUCINEX) 600 MG 12 hr tablet Take 1 tablet (600 mg total) by mouth 2 (two) times daily. (Patient not taking: Reported on 07/13/2014) 14 tablet 0  . HYDROcodone-acetaminophen (NORCO/VICODIN) 5-325 MG per tablet Take 1 tablet by mouth every 6 (six) hours as needed for moderate pain or severe pain. 30 tablet 0  . metFORMIN (GLUMETZA) 1000 MG (MOD) 24 hr tablet Take 1 tablet (1,000 mg total) by mouth daily with breakfast. 90 tablet 4  . montelukast (SINGULAIR) 10 MG tablet Take 1 tablet (10 mg total) by mouth at bedtime. 90 tablet 3  . naproxen (NAPROSYN) 500 MG tablet Take 1 tablet (500 mg total) by mouth 3 (three) times daily as needed. 270 tablet 3  . nicotine (NICODERM CQ - DOSED IN MG/24 HOURS) 21 mg/24hr patch Place 1 patch (21 mg total) onto the skin daily. (Patient not taking: Reported on 07/13/2014) 28 patch 0  . nystatin (MYCOSTATIN) 100000 UNIT/ML suspension TAKE 5  MLS (500,000 UNITS TOTAL) BY MOUTH 4 (FOUR) TIMES DAILY. 473 mL 1  . omeprazole (PRILOSEC) 40 MG capsule Take 1 capsule (40 mg total) by mouth daily. 90 capsule 1  . oxybutynin (DITROPAN) 5 MG tablet Take 1 tablet (5 mg total) by mouth 3 (three) times daily. 270 tablet 3  . potassium chloride (K-DUR) 10 MEQ tablet Take 2 tablets (20 mEq total) by mouth daily. 180 tablet 2  . predniSONE (DELTASONE) 50 MG tablet Take 1 tablet (50 mg total) by mouth daily with breakfast. 3 tablet 0  . ranitidine (ZANTAC) 150 MG tablet Take 1 tablet (150 mg total)  by mouth 2 (two) times daily. (Patient not taking: Reported on 07/13/2014) 60 tablet 1  . tiotropium (SPIRIVA HANDIHALER) 18 MCG inhalation capsule Place 1 capsule (18 mcg total) into inhaler and inhale daily. (Patient not taking: Reported on 07/13/2014) 90 capsule 1  . traMADol (ULTRAM) 50 MG tablet Take 1-2 tablets (50-100 mg total) by mouth every 12 (twelve) hours as needed. Filled via fax, [place], 09/29/13. (Patient not taking: Reported on 07/07/2014) 270 tablet 0  . traZODone (DESYREL) 150 MG tablet Take 1 tablet (150 mg total) by mouth at bedtime. 90 tablet 2  . verapamil (VERELAN PM) 360 MG 24 hr capsule Take 1 capsule (360 mg total) by mouth at bedtime. 90 capsule 4  . ziprasidone (GEODON) 80 MG capsule Take 1 capsule (80 mg total) by mouth 2 (two) times daily with a meal. 180 capsule 3   No current facility-administered medications for this visit.    Functional Status: In your present state of health, do you have any difficulty performing the following activities: 07/08/2014  Hearing? N  Vision? N  Difficulty concentrating or making decisions? N  Walking or climbing stairs? N  Dressing or bathing? N  Doing errands, shopping? N    Fall/Depression Screening: PHQ 2/9 Scores 06/26/2014 05/21/2014 12/31/2013 12/31/2013  PHQ - 2 Score 0 0 0 0    Assessment: 1. Medication assistance: there is no assistance available for Allegra or Mucinex. Both are available over the counter and in generic formulations. The patient can try to get free patches through 1-800-QUIT-NOW but there is no guarantee that they are available.  2. Medication list discrepancy: patient reports being on lorazepam and that it was prescribed by Dr. Venetia Maxon but there is no record of that. Patient is NOT on alprazolam per her report.   Plan: 1. Medication assistance: patient understands that there is no assistance for Mucinex or Allegra since they are over the counter but she can go to the pharmacy and try to find  cheaper, generic alternatives. Patient will call the 1-800-QUIT-NOW to see if there are any patches available. Also encouraged patient to make an appointment with Dr. Valentina Lucks, PharmD at the [facility] for smoking cessation (this was recommended by [facility] Medicine Teaching Service at discharge so a referral already made). 2. Medication list discrepancy: will add lorazepam to patient's medication list. Patient is not taking alprazolam. I am unsure of where the patient is getting this medication but since she is not taking both the alprazolam and the lorazepam, I will alert Dr. Lorenso Courier that she is taking lorazepam and that is has not been prescribed by a physician within the Epic system or [place].   Nicoletta Ba, PharmD, [place] Resident [place] [phone]

## 2014-07-22 ENCOUNTER — Encounter: Payer: Self-pay | Admitting: Family Medicine

## 2014-07-22 ENCOUNTER — Ambulatory Visit (INDEPENDENT_AMBULATORY_CARE_PROVIDER_SITE_OTHER): Payer: Commercial Managed Care - HMO | Admitting: Family Medicine

## 2014-07-22 VITALS — BP 153/93 | HR 79 | Temp 99.2°F | Ht 64.0 in | Wt 252.1 lb

## 2014-07-22 DIAGNOSIS — I10 Essential (primary) hypertension: Secondary | ICD-10-CM | POA: Diagnosis not present

## 2014-07-22 DIAGNOSIS — Z720 Tobacco use: Secondary | ICD-10-CM | POA: Diagnosis not present

## 2014-07-22 DIAGNOSIS — J441 Chronic obstructive pulmonary disease with (acute) exacerbation: Secondary | ICD-10-CM

## 2014-07-22 DIAGNOSIS — G4733 Obstructive sleep apnea (adult) (pediatric): Secondary | ICD-10-CM

## 2014-07-22 DIAGNOSIS — Z9989 Dependence on other enabling machines and devices: Secondary | ICD-10-CM

## 2014-07-22 DIAGNOSIS — F17200 Nicotine dependence, unspecified, uncomplicated: Secondary | ICD-10-CM | POA: Insufficient documentation

## 2014-07-22 MED ORDER — NAPROXEN 500 MG PO TABS: 500.0000 mg | ORAL_TABLET | Freq: Three times a day (TID) | ORAL | Status: DC | PRN

## 2014-07-22 MED ORDER — ALBUTEROL SULFATE HFA 108 (90 BASE) MCG/ACT IN AERS: 2.0000 | INHALATION_SPRAY | Freq: Four times a day (QID) | RESPIRATORY_TRACT | Status: DC | PRN

## 2014-07-22 MED ORDER — VERAPAMIL HCL ER 360 MG PO CP24: 360.0000 mg | ORAL_CAPSULE | Freq: Every day | ORAL | Status: DC

## 2014-07-22 MED ORDER — CLONIDINE HCL 0.2 MG PO TABS: 0.2000 mg | ORAL_TABLET | Freq: Three times a day (TID) | ORAL | Status: DC

## 2014-07-22 MED ORDER — NICOTINE 21 MG/24HR TD PT24: 21.0000 mg | MEDICATED_PATCH | Freq: Every day | TRANSDERMAL | Status: DC

## 2014-07-22 MED ORDER — MONTELUKAST SODIUM 10 MG PO TABS: 10.0000 mg | ORAL_TABLET | Freq: Every day | ORAL | Status: DC

## 2014-07-22 MED ORDER — OMEPRAZOLE 40 MG PO CPDR: 40.0000 mg | DELAYED_RELEASE_CAPSULE | Freq: Every day | ORAL | Status: DC

## 2014-07-22 MED ORDER — METFORMIN HCL ER (MOD) 1000 MG PO TB24: 1000.0000 mg | ORAL_TABLET | Freq: Every day | ORAL | Status: DC

## 2014-07-22 MED ORDER — OXYBUTYNIN CHLORIDE 5 MG PO TABS: 5.0000 mg | ORAL_TABLET | Freq: Three times a day (TID) | ORAL | Status: DC

## 2014-07-22 MED ORDER — BUDESONIDE-FORMOTEROL FUMARATE 160-4.5 MCG/ACT IN AERO: 2.0000 | INHALATION_SPRAY | Freq: Two times a day (BID) | RESPIRATORY_TRACT | Status: DC

## 2014-07-22 NOTE — Assessment & Plan Note (Signed)
Quit date 07/18/14 Continue nicotine patch 21 mg daily for a total of 6 weeks Would plan to then transition to 14 mg patch daily for 2 weeks and then 7 mg patch daily for 2 weeks Follow-up with PCP in one month

## 2014-07-22 NOTE — Assessment & Plan Note (Signed)
Again encourage patient to get C Pap machine out of storage and resume use

## 2014-07-22 NOTE — Assessment & Plan Note (Signed)
BP elevated today the patient has not taken her morning medications yet Continue on current medication regimen follow-up at next visit with PCP

## 2014-07-22 NOTE — Patient Instructions (Addendum)
Nice to meet you today. Continue to take your Symbicort, Spiriva, Singulair, and use albuterol as needed for your COPD.  He shouldn't take your blood pressure medicines every day.  For your quitting smoking, continue to use the 21 mg nicotine patch for another month (until you follow up with Dr. Lorenso Courier).  Come back in one month to see Dr. Lorenso Courier about your diabetes, blood pressure, and tobacco cessation.  Take care, Dr. Jacinto Reap

## 2014-07-22 NOTE — Progress Notes (Signed)
   Subjective:   Tiffany [name] is a 54 y.o. female with a history of COPD, HTN, T2 DM, obesity, HLD, depression, tobacco abuse here for hospital follow-up.  Hospitalized from 6/21 to 6/23 for COPD exacerbation - Breathing improved since discharge - Been taking spiriva - given one before left hospital - will need forms filled out to continue getting it for free (likely sent to PCP) - Still coughing - only coughing up sputum occasionally (grey in color) - Still taking albuterol q6h - Also taking symbicort and singulair  OSA: CPAP still in storage  Tobacco abuse: - No cigarettes for 4 days  - Was previously smoking 2ppd x40yrs - Using nicotine patch 21mg  - seems to be helping  HTN: - Taking verapimil, clonidine - hasnt taken doses this AM - Usually takes medications - No CP, SOB chronic but improving, no LE edema  Review of Systems:  Per HPI. All other systems reviewed and are negative.   PMH, PSH, Medications, Allergies, and FmHx reviewed and updated in EMR.  Social History: former smoker  Objective:  BP 153/93 mmHg  Pulse 79  Temp(Src) 99.2 F (37.3 C) (Oral)  Ht 5\' 4"  (1.626 m)  Wt 252 lb 1.6 oz (114.352 kg)  BMI 43.25 kg/m2  Gen:  54 y.o. female in NAD HEENT: NCAT, MMM, EOMI, PERRL, anicteric sclerae CV: RRR, no MRG, no JVD Resp: Non-labored, Initially with diffuse rhonchorus breath sounds that clear with cough, CTAB, no wheezes noted Abd: Soft, NTND, BS present, no guarding or organomegaly Ext: WWP, no edema Skin: no rashes Neuro: Alert and oriented, speech normal      Chemistry      Component Value Date/Time   NA 140 07/08/2014 0430   K 3.4* 07/08/2014 0430   CL 104 07/08/2014 0430   CO2 24 07/08/2014 0430   BUN 14 07/08/2014 0430   CREATININE 1.17* 07/08/2014 0430   CREATININE 1.08 05/21/2014 1153      Component Value Date/Time   CALCIUM 8.7* 07/08/2014 0430   ALKPHOS 122* 07/15/2013 1459   AST 8 07/15/2013 1459   ALT 8 07/15/2013 1459   BILITOT  0.2 07/15/2013 1459      Lab Results  Component Value Date   WBC 16.0* 07/08/2014   HGB 11.1* 07/08/2014   HCT 35.6* 07/08/2014   MCV 91.5 07/08/2014   PLT 345 07/08/2014   Lab Results  Component Value Date   TSH 1.847 09/30/2012   Lab Results  Component Value Date   HGBA1C 5.5 05/21/2014   Assessment:     Tiffany [name] is a 54 y.o. female here for hospital f/u.    Plan:     See problem list for problem-specific plans.   Virginia Crews, MD PGY-2,  [facility] 07/22/2014  2:48 PM

## 2014-07-22 NOTE — Assessment & Plan Note (Signed)
Resolved. Breathing improved and stable on room air Continue Symbicort, Singulair, Spiriva, and albuterol when necessary

## 2014-07-24 ENCOUNTER — Other Ambulatory Visit: Payer: Self-pay | Admitting: *Deleted

## 2014-07-24 NOTE — Patient Outreach (Signed)
Call placed to member for weekly transition of care contact.  No answer, HIPPA compliant voice message left.  Will await call back.  Will continue with transition of care next week.  [name], BSN, [place] Management  [facility] Care Manager [phone]

## 2014-07-29 ENCOUNTER — Other Ambulatory Visit: Payer: Self-pay | Admitting: *Deleted

## 2014-07-29 NOTE — Patient Outreach (Signed)
Transition of Care call attempted, no answer.  HIPPA compliant voice message left.  Will await call back.  Will continue with calls next week.  [name], BSN, [place] Management  [facility] Care Manager [phone]

## 2014-07-30 ENCOUNTER — Other Ambulatory Visit: Payer: Self-pay | Admitting: *Deleted

## 2014-07-30 ENCOUNTER — Encounter: Payer: Self-pay | Admitting: *Deleted

## 2014-07-30 NOTE — Patient Outreach (Signed)
Call received back from member.  She reports that afternoons are always a better time for her because she does not get up early.  Member notified that this care manager was unable to complete the transition of care call at this time due to inability to get to open the computer and current time being 450.  Member verbalizes understanding as she has no urgent concerns at this time.  Member made aware that this care manager would provide a call back to the member on tomorrow, member request to be called after 4 pm.    Will call member tomorrow around 4 pm.  [name], BSN, [place] Manager [phone]

## 2014-07-30 NOTE — Patient Outreach (Signed)
Third unsuccessful attempt to make contact with member.  HIPPA compliant voice message left.  Initial contact made on 6/27 was successful, have not been able to maintain contact since that date.  Will await call back.  Will send outreach letter and wait ten days for call back. If no call back in 10 days, will send closure letter to member and physicina.  [name], BSN, [place] Management  [facility] Care Manager [phone]

## 2014-07-31 ENCOUNTER — Other Ambulatory Visit: Payer: Self-pay | Admitting: *Deleted

## 2014-07-31 ENCOUNTER — Encounter: Payer: Self-pay | Admitting: *Deleted

## 2014-07-31 NOTE — Patient Outreach (Addendum)
Call placed to member today per her request on yesterday to complete this week's transition of care call.  Member states that she has been doing "alright" since our last conversation.  She reports that she has not smoked any cigarettes in 9 days and that she is now using Nicotine patches.  She states that she had her follow up appointment with her primary physician and that her next appointment will be next month.  She states that there were no major concerns expressed by the physician.  She reports that she has been breathing "ok" until at night.  She states that her nose feels "stopped up" at night.  Member has a CPAP machine that she states is in storage.  This care manager inquires about when she will be able to obtain it.  Member reports that she does not know exactly which box the machine is in and that she "don't feel up to all that", referring to the time involved in going through several boxes in storage to find the machine.  Member encouraged to retrieve machine from storage to increase comfort of breathing at night.    Member has a history of hypertension, but states that she does not monitor her blood pressure on a daily basis.  She states that her mother has a machine and that she will be able to monitor it with that.  Member advised to record daily readings to monitor a trend in her blood pressure to assist the physician in managing her medications.  Member states that she is still waiting to hear back from the pharmacist regarding the medications that she can't afford (Allegra, Mucinex, and Spiriva).  Member made aware that this care manager will reach out to pharmacist to discuss involvement and progress with medications.  This care manager lost contact with the member for a couple weeks (member did not return call until this week).  Due to member's schedule with other appointments with herself and her son, an initial home visit could not be scheduled until 8/1.  Member denies any other  concerns at this time. Encouraged to contact this care manager with any questions.  Will continue transition of care program next week.  [name], BSN, [place] Management  [facility] Care Manager [phone]

## 2014-08-02 ENCOUNTER — Other Ambulatory Visit: Payer: Self-pay | Admitting: Family Medicine

## 2014-08-02 DIAGNOSIS — E119 Type 2 diabetes mellitus without complications: Secondary | ICD-10-CM

## 2014-08-03 NOTE — Telephone Encounter (Signed)
Rx for ASA refilled.  Archie Patten, MD [facility] Family Medicine Resident  08/03/2014, 8:09 PM

## 2014-08-06 ENCOUNTER — Other Ambulatory Visit: Payer: Self-pay | Admitting: *Deleted

## 2014-08-06 NOTE — Patient Outreach (Signed)
Weekly transition of care call placed.  No answer, HIPPA compliant voice message left.  Will await call back.  If no call back, will continue with calls next week.  [name], BSN, [place] Management  [facility] Care Manager [phone]

## 2014-08-07 ENCOUNTER — Other Ambulatory Visit: Payer: Self-pay | Admitting: Pharmacist

## 2014-08-07 NOTE — Telephone Encounter (Signed)
Pt called because the [facility] is unsure which Metformin they are suppose to be sending the patient. She would like [name] to call and straighten this out. Tiffany [name]

## 2014-08-07 NOTE — Patient Outreach (Signed)
[facility]) Care Management  [place]   08/07/2014  MIRAKLE MEOLA Sep 16, 1960 [id number]  Subjective: Tiffany [name] is a 54 y.o. female who was referred to pharmacy for medication assistance with Spiriva and nicotine patches.  I received a message from [place], about patient wondering about the status of her assistance with Spiriva. She was also wondering how she was going to get nicotine patches when she runs out.  Patient and I have discussed this in the past but the Spiriva application will have to be signed by her physician at [facility]. I also had instructed her to call 1-800-QUIT-NOW to see if they had any nicotine patches available as this is the only assistance for nicotine patches.   I called patient to follow up  Objective:   Current Medications: Current Outpatient Prescriptions  Medication Sig Dispense Refill  . albuterol (PROVENTIL HFA;VENTOLIN HFA) 108 (90 BASE) MCG/ACT inhaler Inhale 2 puffs into the lungs every 6 (six) hours as needed. 1 Inhaler 6  . albuterol (PROVENTIL) (2.5 MG/3ML) 0.083% nebulizer solution INHALE CONTENTS OF 1 VIAL VIA NEBULIZER EVERY 6  HOURS AS NEEDED FOR SHORTNESS OF BREATH. 90 mL 1  . ALPRAZolam (XANAX) 1 MG tablet Take 1 tablet (1 mg total) by mouth at bedtime as needed for anxiety. (Patient not taking: Reported on 07/07/2014) 60 tablet 1  . aspirin 81 MG EC tablet TAKE 1 TABLET (81 MG TOTAL) BY MOUTH DAILY. 30 tablet 0  . atorvastatin (LIPITOR) 40 MG tablet Take 1 tablet (40 mg total) by mouth daily. (Patient not taking: Reported on 07/13/2014) 90 tablet 4  . budesonide-formoterol (SYMBICORT) 160-4.5 MCG/ACT inhaler Inhale 2 puffs into the lungs 2 (two) times daily. 1 Inhaler 6  . cloNIDine (CATAPRES) 0.2 MG tablet Take 1 tablet (0.2 mg total) by mouth 3 (three) times daily. 270 tablet 3  . cyclobenzaprine (FLEXERIL) 10 MG tablet Take 1 tablet (10 mg total) by mouth 3 (three) times daily as  needed for muscle spasms. 60 tablet 2  . fexofenadine (ALLEGRA) 60 MG tablet Take 2 tablets (120 mg total) by mouth 2 (two) times daily. (Patient not taking: Reported on 07/13/2014) 90 tablet 4  . fluticasone (FLONASE) 50 MCG/ACT nasal spray Place 2 sprays into both nostrils daily. (Patient not taking: Reported on 07/13/2014) 16 g 3  . furosemide (LASIX) 40 MG tablet Take 1 tablet (40 mg total) by mouth daily. 90 tablet 3  . gabapentin (NEURONTIN) 600 MG tablet Take 1 tablet (600 mg total) by mouth 3 (three) times daily. 270 tablet 4  . guaiFENesin (MUCINEX) 600 MG 12 hr tablet Take 1 tablet (600 mg total) by mouth 2 (two) times daily. (Patient not taking: Reported on 07/13/2014) 14 tablet 0  . HYDROcodone-acetaminophen (NORCO/VICODIN) 5-325 MG per tablet Take 1 tablet by mouth every 6 (six) hours as needed for moderate pain or severe pain. 30 tablet 0  . LORazepam (ATIVAN) 0.5 MG tablet Take 0.5 mg by mouth at bedtime.    . metFORMIN (GLUMETZA) 1000 MG (MOD) 24 hr tablet Take 1 tablet (1,000 mg total) by mouth daily with breakfast. 90 tablet 4  . montelukast (SINGULAIR) 10 MG tablet Take 1 tablet (10 mg total) by mouth at bedtime. 90 tablet 3  . naproxen (NAPROSYN) 500 MG tablet Take 1 tablet (500 mg total) by mouth 3 (three) times daily as needed. 270 tablet 3  . nicotine (NICODERM CQ - DOSED IN MG/24 HOURS) 21 mg/24hr patch  Place 1 patch (21 mg total) onto the skin daily. 28 patch 0  . nystatin (MYCOSTATIN) 100000 UNIT/ML suspension TAKE 5 MLS (500,000 UNITS TOTAL) BY MOUTH 4 (FOUR) TIMES DAILY. 473 mL 1  . omeprazole (PRILOSEC) 40 MG capsule Take 1 capsule (40 mg total) by mouth daily. 90 capsule 1  . oxybutynin (DITROPAN) 5 MG tablet Take 1 tablet (5 mg total) by mouth 3 (three) times daily. 270 tablet 3  . potassium chloride (K-DUR) 10 MEQ tablet Take 2 tablets (20 mEq total) by mouth daily. 180 tablet 2  . predniSONE (DELTASONE) 50 MG tablet Take 1 tablet (50 mg total) by mouth daily with  breakfast. 3 tablet 0  . ranitidine (ZANTAC) 150 MG tablet Take 1 tablet (150 mg total) by mouth 2 (two) times daily. (Patient not taking: Reported on 07/13/2014) 60 tablet 1  . tiotropium (SPIRIVA HANDIHALER) 18 MCG inhalation capsule Place 1 capsule (18 mcg total) into inhaler and inhale daily. (Patient not taking: Reported on 07/13/2014) 90 capsule 1  . traMADol (ULTRAM) 50 MG tablet Take 1-2 tablets (50-100 mg total) by mouth every 12 (twelve) hours as needed. Filled via fax, [place], 09/29/13. (Patient not taking: Reported on 07/07/2014) 270 tablet 0  . traZODone (DESYREL) 150 MG tablet Take 1 tablet (150 mg total) by mouth at bedtime. 90 tablet 2  . verapamil (VERELAN PM) 360 MG 24 hr capsule Take 1 capsule (360 mg total) by mouth at bedtime. 90 capsule 4  . ziprasidone (GEODON) 80 MG capsule Take 1 capsule (80 mg total) by mouth 2 (two) times daily with a meal. 180 capsule 3   No current facility-administered medications for this visit.    Functional Status: In your present state of health, do you have any difficulty performing the following activities: 07/31/2014 07/08/2014  Hearing? N N  Vision? Y N  Difficulty concentrating or making decisions? N N  Walking or climbing stairs? Y N  Dressing or bathing? Y N  Doing errands, shopping? N N  Preparing Food and eating ? N -  Using the Toilet? N -  In the past six months, have you accidently leaked urine? N -  Do you have problems with loss of bowel control? N -  Managing your Medications? N -  Managing your Finances? N -  Housekeeping or managing your Housekeeping? Y -    Fall/Depression Screening: PHQ 2/9 Scores 07/22/2014 06/26/2014 05/21/2014 12/31/2013 12/31/2013  PHQ - 2 Score 0 0 0 0 0    Assessment: 1. Medication assistance: patient in need of assistance with Spiriva and with nicotine patches.    Plan: 1. Medication assistance:  I called the patient to discuss medication assistance and I had to leave a HIPAA compliant  message for patient to return my phone call.  I will reach out on 08/12/14 if the patient does not return my call today.   Nicoletta Ba, PharmD, [facility] [phone]

## 2014-08-10 ENCOUNTER — Encounter: Payer: Self-pay | Admitting: *Deleted

## 2014-08-10 ENCOUNTER — Other Ambulatory Visit: Payer: Self-pay | Admitting: Family Medicine

## 2014-08-10 DIAGNOSIS — J449 Chronic obstructive pulmonary disease, unspecified: Secondary | ICD-10-CM

## 2014-08-10 MED ORDER — TIOTROPIUM BROMIDE MONOHYDRATE 18 MCG IN CAPS: 18.0000 ug | ORAL_CAPSULE | Freq: Two times a day (BID) | RESPIRATORY_TRACT | Status: DC

## 2014-08-10 MED ORDER — TIOTROPIUM BROMIDE MONOHYDRATE 18 MCG IN CAPS: 18.0000 ug | ORAL_CAPSULE | Freq: Every day | RESPIRATORY_TRACT | Status: DC

## 2014-08-10 NOTE — Telephone Encounter (Signed)
Metformin- XR (24hr release) is what she should be taking- 1000mg  daily.  Rx sent.  Thanks, Archie Patten, MD [facility] Family Medicine Resident  08/10/2014, 2:04 PM

## 2014-08-12 ENCOUNTER — Encounter (INDEPENDENT_AMBULATORY_CARE_PROVIDER_SITE_OTHER): Payer: Self-pay

## 2014-08-12 ENCOUNTER — Encounter (HOSPITAL_COMMUNITY): Payer: Self-pay | Admitting: Psychiatry

## 2014-08-12 ENCOUNTER — Ambulatory Visit (INDEPENDENT_AMBULATORY_CARE_PROVIDER_SITE_OTHER): Payer: Medicare HMO | Admitting: Psychiatry

## 2014-08-12 VITALS — BP 105/65 | HR 62 | Ht 64.0 in | Wt 265.2 lb

## 2014-08-12 DIAGNOSIS — F3130 Bipolar disorder, current episode depressed, mild or moderate severity, unspecified: Secondary | ICD-10-CM | POA: Diagnosis not present

## 2014-08-12 DIAGNOSIS — F419 Anxiety disorder, unspecified: Secondary | ICD-10-CM | POA: Diagnosis not present

## 2014-08-12 DIAGNOSIS — F200 Paranoid schizophrenia: Secondary | ICD-10-CM | POA: Diagnosis not present

## 2014-08-12 MED ORDER — MIRTAZAPINE 15 MG PO TBDP: 15.0000 mg | ORAL_TABLET | Freq: Every day | ORAL | Status: DC

## 2014-08-12 MED ORDER — ZIPRASIDONE HCL 80 MG PO CAPS: 80.0000 mg | ORAL_CAPSULE | Freq: Two times a day (BID) | ORAL | Status: DC

## 2014-08-12 MED ORDER — ESCITALOPRAM OXALATE 20 MG PO TABS: ORAL_TABLET | ORAL | Status: DC
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
## 2014-08-14 ENCOUNTER — Other Ambulatory Visit: Payer: Self-pay | Admitting: *Deleted

## 2014-08-14 ENCOUNTER — Other Ambulatory Visit: Payer: Self-pay | Admitting: Pharmacist

## 2014-08-14 ENCOUNTER — Other Ambulatory Visit: Payer: Self-pay | Admitting: Family Medicine

## 2014-08-14 DIAGNOSIS — J449 Chronic obstructive pulmonary disease, unspecified: Secondary | ICD-10-CM

## 2014-08-14 MED ORDER — TIOTROPIUM BROMIDE MONOHYDRATE 18 MCG IN CAPS: 18.0000 ug | ORAL_CAPSULE | Freq: Every day | RESPIRATORY_TRACT | Status: DC

## 2014-08-14 NOTE — Patient Outreach (Addendum)
Member was on this care manager's scheduled for a call this afternoon, however, member called this care manager instead this morning.  Member states that she has been doing well.  She reports that she took her last Spiriva yesterday and does not have the finances to purchase a refill.  [name] inquiring about the progress the [facility] pharmacist is making in finding an alternative way to get the medications.  Member made aware that this care manager would contact the pharmacist and provide a call back with further instructions.  Member states that she has changed primary care provider to Dr. Lorenso Courier and would like to make sure that all of her information is sent to the right person.  Member confirms initial home visit scheduled for this Monday.  Encouraged to contact this care manager if she can't keep appointment or if she has any other concerns.  [name], BSN, [facility]  [facility] Care Manager [phone]

## 2014-08-14 NOTE — Progress Notes (Signed)
This encounter was created in error - please disregard.

## 2014-08-14 NOTE — Patient Outreach (Signed)
[facility] Care Management  [place]   08/14/2014  Tiffany [name] [id number]  Subjective: Tiffany [name] is a 54 y.o. female who was referred to pharmacy for medication assistance with Spiriva and nicotine patches.  I called patient to follow up and she reports that she really needs her Spiriva. She ran out yesterday and can't afford it. She reports that [facility] will not send it to her because she cannot pay the $300 copay - it is a three month supply and they will not send her any less.   Objective:   Current Medications: Current Outpatient Prescriptions  Medication Sig Dispense Refill  . ACCU-CHEK SOFTCLIX LANCETS lancets     . albuterol (PROVENTIL HFA;VENTOLIN HFA) 108 (90 BASE) MCG/ACT inhaler Inhale 2 puffs into the lungs every 6 (six) hours as needed. 1 Inhaler 6  . aspirin 81 MG EC tablet TAKE 1 TABLET (81 MG TOTAL) BY MOUTH DAILY. 30 tablet 0  . atorvastatin (LIPITOR) 40 MG tablet Take 1 tablet (40 mg total) by mouth daily. (Patient not taking: Reported on 07/13/2014) 90 tablet 4  . budesonide-formoterol (SYMBICORT) 160-4.5 MCG/ACT inhaler Inhale 2 puffs into the lungs 2 (two) times daily. 1 Inhaler 6  . cloNIDine (CATAPRES) 0.2 MG tablet Take 1 tablet (0.2 mg total) by mouth 3 (three) times daily. 270 tablet 3  . escitalopram (LEXAPRO) 20 MG tablet Take 1 and 1/2 tab daily 45 tablet 0  . fluticasone (FLONASE) 50 MCG/ACT nasal spray Place 2 sprays into both nostrils daily. (Patient not taking: Reported on 07/13/2014) 16 g 3  . furosemide (LASIX) 40 MG tablet Take 1 tablet (40 mg total) by mouth daily. 90 tablet 3  . gabapentin (NEURONTIN) 600 MG tablet Take 1 tablet (600 mg total) by mouth 3 (three) times daily. 270 tablet 4  . metFORMIN (GLUCOPHAGE-XR) 500 MG 24 hr tablet TAKE 2 TABLETS EVERY DAY  WITH  BREAKFAST 180 tablet 3  . mirtazapine (REMERON SOL-TAB) 15 MG disintegrating tablet Take 1 tablet (15 mg total) by mouth at bedtime. 30 tablet  0  . montelukast (SINGULAIR) 10 MG tablet Take 1 tablet (10 mg total) by mouth at bedtime. 90 tablet 3  . naproxen (NAPROSYN) 500 MG tablet Take 1 tablet (500 mg total) by mouth 3 (three) times daily as needed. 270 tablet 3  . nicotine (NICODERM CQ - DOSED IN MG/24 HOURS) 21 mg/24hr patch Place 1 patch (21 mg total) onto the skin daily. 28 patch 0  . omeprazole (PRILOSEC) 40 MG capsule Take 1 capsule (40 mg total) by mouth daily. 90 capsule 1  . oxybutynin (DITROPAN) 5 MG tablet Take 1 tablet (5 mg total) by mouth 3 (three) times daily. 270 tablet 3  . potassium chloride (K-DUR) 10 MEQ tablet Take 2 tablets (20 mEq total) by mouth daily. 180 tablet 2  . tiotropium (SPIRIVA HANDIHALER) 18 MCG inhalation capsule Place 1 capsule (18 mcg total) into inhaler and inhale daily. 30 capsule 12  . verapamil (VERELAN PM) 360 MG 24 hr capsule Take 1 capsule (360 mg total) by mouth at bedtime. 90 capsule 4  . ziprasidone (GEODON) 80 MG capsule Take 1 capsule (80 mg total) by mouth 2 (two) times daily with a meal. 90 capsule 0   No current facility-administered medications for this visit.    Functional Status: In your present state of health, do you have any difficulty performing the following activities: 07/31/2014 07/08/2014  Hearing? N N  Vision? Aggie Moats  Difficulty concentrating or making decisions? N N  Walking or climbing stairs? Y N  Dressing or bathing? Y N  Doing errands, shopping? N N  Preparing Food and eating ? N -  Using the Toilet? N -  In the past six months, have you accidently leaked urine? N -  Do you have problems with loss of bowel control? N -  Managing your Medications? N -  Managing your Finances? N -  Housekeeping or managing your Housekeeping? Y -    Fall/Depression Screening: PHQ 2/9 Scores 07/22/2014 06/26/2014 05/21/2014 12/31/2013 12/31/2013  PHQ - 2 Score 0 0 0 0 0    Assessment: 1. Medication assistance: patient in need of assistance with Spiriva - this is a medication that  without, patient could be admitted with a COPD exacerbation.    Plan: 1. Medication assistance:  I called [facility] and they will send a prescription for Spiriva to [facility]. Discussed case with [name], PharmD - Will use [facility] emergency funds to cover the cost of the Spiriva at this time. Will refer patient to [facility], for low-income subsidy and Spiriva patient assistance application. [place] [address], [place], has been updated.    Nicoletta Ba, PharmD, [employer] [phone]

## 2014-08-14 NOTE — Patient Outreach (Signed)
Of note, patient aware of this plan and will go to [facility] to pick up prescription later today. She will call me with any issues.

## 2014-08-14 NOTE — Patient Outreach (Signed)
Call placed to member after collaboration with pharmacist regarding member's spiriva.  Member made aware that her medication would be refilled and ready for pick up at the [facility].  Member states that she will be able to pick it up today.  Encouraged to contact this care manager with any other concerns.  [name], BSN, [place] Management  [facility] Care Manager [phone]

## 2014-08-17 ENCOUNTER — Other Ambulatory Visit: Payer: Self-pay | Admitting: *Deleted

## 2014-08-17 ENCOUNTER — Encounter: Payer: Self-pay | Admitting: *Deleted

## 2014-08-17 VITALS — BP 140/94 | HR 66 | Resp 18 | Ht 64.0 in | Wt 264.0 lb

## 2014-08-17 DIAGNOSIS — J441 Chronic obstructive pulmonary disease with (acute) exacerbation: Secondary | ICD-10-CM

## 2014-08-17 DIAGNOSIS — I10 Essential (primary) hypertension: Secondary | ICD-10-CM

## 2014-08-17 NOTE — Patient Outreach (Signed)
[facility]) Care Management   08/17/2014  Tiffany [name] 1960-08-18 [id number]  Tiffany [name] is an 54 y.o. female  Subjective:   Member states that she is "doing good."  Denies any pain or discomfort at this time.  Objective:   Review of Systems  Constitutional: Negative.   HENT: Negative.   Eyes: Negative.   Respiratory: Negative.   Cardiovascular: Negative.   Gastrointestinal: Negative.   Genitourinary: Negative.   Musculoskeletal: Negative.   Skin: Negative.   Neurological: Negative.   Endo/Heme/Allergies: Negative.   Psychiatric/Behavioral: Negative.     Physical Exam  Constitutional: [name] oriented to person, place, and time. She appears well-developed and well-nourished.  Neck: Normal range of motion.  Cardiovascular: Normal rate, regular rhythm and normal heart sounds.   Respiratory: Effort normal and breath sounds normal.  GI: Soft. Bowel sounds are normal.  Musculoskeletal: Normal range of motion.  Neurological: [name] alert and oriented to person, place, and time.  Skin: Skin is warm and dry.   BP 140/94 mmHg  Pulse 66  Resp 18  Ht 1.626 m ('5\' 4"' )  Wt 264 lb (119.75 kg)  BMI 45.29 kg/m2  SpO2 94%  Current Medications:   Current Outpatient Prescriptions  Medication Sig Dispense Refill  . ACCU-CHEK SOFTCLIX LANCETS lancets     . albuterol (PROVENTIL HFA;VENTOLIN HFA) 108 (90 BASE) MCG/ACT inhaler Inhale 2 puffs into the lungs every 6 (six) hours as needed. 1 Inhaler 6  . aspirin 81 MG EC tablet TAKE 1 TABLET (81 MG TOTAL) BY MOUTH DAILY. 30 tablet 0  . atorvastatin (LIPITOR) 40 MG tablet Take 1 tablet (40 mg total) by mouth daily. 90 tablet 4  . budesonide-formoterol (SYMBICORT) 160-4.5 MCG/ACT inhaler Inhale 2 puffs into the lungs 2 (two) times daily. 1 Inhaler 6  . cloNIDine (CATAPRES) 0.2 MG tablet Take 1 tablet (0.2 mg total) by mouth 3 (three) times daily. 270 tablet 3  . diphenhydrAMINE (BENADRYL) 25 mg capsule Take 50 mg by  mouth every 6 (six) hours as needed.    [name] escitalopram (LEXAPRO) 20 MG tablet Take 1 and 1/2 tab daily 45 tablet 0  . furosemide (LASIX) 40 MG tablet Take 1 tablet (40 mg total) by mouth daily. 90 tablet 3  . gabapentin (NEURONTIN) 600 MG tablet Take 1 tablet (600 mg total) by mouth 3 (three) times daily. 270 tablet 4  . metFORMIN (GLUCOPHAGE-XR) 500 MG 24 hr tablet TAKE 2 TABLETS EVERY DAY  WITH  BREAKFAST 180 tablet 3  . montelukast (SINGULAIR) 10 MG tablet Take 1 tablet (10 mg total) by mouth at bedtime. 90 tablet 3  . naproxen (NAPROSYN) 500 MG tablet Take 1 tablet (500 mg total) by mouth 3 (three) times daily as needed. 270 tablet 3  . nicotine (NICODERM CQ - DOSED IN MG/24 HOURS) 21 mg/24hr patch Place 1 patch (21 mg total) onto the skin daily. 28 patch 0  . omeprazole (PRILOSEC) 40 MG capsule Take 1 capsule (40 mg total) by mouth daily. 90 capsule 1  . oxybutynin (DITROPAN) 5 MG tablet Take 1 tablet (5 mg total) by mouth 3 (three) times daily. 270 tablet 3  . potassium chloride (K-DUR) 10 MEQ tablet Take 2 tablets (20 mEq total) by mouth daily. 180 tablet 2  . tiotropium (SPIRIVA HANDIHALER) 18 MCG inhalation capsule Place 1 capsule (18 mcg total) into inhaler and inhale daily. 30 capsule 12  . traZODone (DESYREL) 100 MG tablet Take 100 mg by mouth at  bedtime.    . verapamil (VERELAN PM) 360 MG 24 hr capsule Take 1 capsule (360 mg total) by mouth at bedtime. 90 capsule 4  . ziprasidone (GEODON) 80 MG capsule Take 1 capsule (80 mg total) by mouth 2 (two) times daily with a meal. 90 capsule 0  . fluticasone (FLONASE) 50 MCG/ACT nasal spray Place 2 sprays into both nostrils daily. (Patient not taking: Reported on 07/13/2014) 16 g 3  . mirtazapine (REMERON SOL-TAB) 15 MG disintegrating tablet Take 1 tablet (15 mg total) by mouth at bedtime. (Patient not taking: Reported on 08/17/2014) 30 tablet 0   No current facility-administered medications for this visit.    Functional Status:   In your  present state of health, do you have any difficulty performing the following activities: 07/31/2014 07/08/2014  Hearing? N N  Vision? Y N  Difficulty concentrating or making decisions? N N  Walking or climbing stairs? Y N  Dressing or bathing? Y N  Doing errands, shopping? N N  Preparing Food and eating ? N -  Using the Toilet? N -  In the past six months, have you accidently leaked urine? N -  Do you have problems with loss of bowel control? N -  Managing your Medications? N -  Managing your Finances? N -  Housekeeping or managing your Housekeeping? Y -    Fall/Depression Screening:    PHQ 2/9 Scores 08/17/2014 07/22/2014 06/26/2014 05/21/2014 12/31/2013 12/31/2013  PHQ - 2 Score 2 0 0 0 0 0  PHQ- 9 Score 4 - - - - -    Assessment:    Met with member at scheduled time at her current residence.  At this time she and her teenage son resides with her mom.  She expresses interest in finding housing of her own, but is requesting assistance.  She reports that she at times feel down or depressed because she has not been able to get back out on her own yet.  She states that she has been with her mom since February.  Member made aware that social worker will be contacted for assistance.  Member reports that she has stopped smoking, and is using a nicotine patch to help with cessation.  Member made aware to call 1-800-QUIT NOW for more patches if needed.  She reports that she has started to gain more weight since she has quit smoking.  [name] concerned with the weight gain and is interested in losing weight.  Exercise and diet discussed, member states that she is limited on exercise due to her rheumatoid arthritis.  She states that her mom has an exercise machine in the living room that helps with upper body strength, and she is willing to attempt a workout on that a couple days a week.  [name] aware of the diet she was instructed to maintain (low sodium, diabetic) but states that her concern is portion  control.  She reports that she will begin to decrease her portions in effort to lose weight.  Member's last admission to the hospital was for respiratory distress/dyspnea.  Member has history of asthma and COPD.  She reports that she is breathing much better since she is no longer smoking.  She reports that she is taking all medications as prescribed, but that she is still concerned about the cost of her Spiriva, Symbicort, and Proventil.  COPD education provided, including action plan and zones.  COPD folder with information on the action plan and smoking cessation provided.  Member  reports that her CPAP machine is still in storage and that she is unable to obtain it at this time.  This care manager again discussed the importance of getting the machine to use on a regular basis.  Member provided with [facility] calendar, encouraged to monitor blood pressure at least 3-4 times a week (history of hypertension) and weights several times a week.  Instructed to document in the designated areas in the calendar to monitor trends.  Member also provided with pill box for medication management.  Member denies any further concerns at this time.  Contact information provided, encouraged to contact with any questions.  Plan:   Will consult social worker for PHQ 9 score of 4 and member's interest in finding housing. Will send involvement letter and initial assessment to primary care physician. Will make care manager assistant aware of specific medications member is in need of assistance with. Routine home visit scheduled for next month.  [facility] CM Care Plan Problem One        Patient Outreach from 08/17/2014 in [place] Problem One  Recent hospitalization   Care Plan for Problem One  Not Active   [facility] Long Term Goal (31-90 days)  Member will not be readmitted in the hospital within the next 31 days   [facility] Long Term Goal Start Date  07/13/14   [facility] Long Term Goal Met Date  08/17/14   Interventions  for Problem One Long Term Goal  Discussed with member the importance of following discharge instructions, taking medications as instructed, and attending follow up appointments in effort to decrease risk of readmission   [facility] CM Short Term Goal #1 (0-30 days)  Member will take medications as prescribed over the next 4 weeks   [facility] CM Short Term Goal #1 Start Date  07/13/14   [facility] CM Short Term Goal #1 Met Date  08/17/14   Interventions for Short Term Goal #1  Discussed with member the importance of following discharge instructions, taking medications as instructed, and attending follow up appointments in effort to decrease risk of readmission   [facility] CM Short Term Goal #2 (0-30 days)  Member will attend primary care follow up appointment within the next 2 weeks   [facility] CM Short Term Goal #2 Start Date  07/13/14   [facility] CM Short Term Goal #2 Met Date  08/17/14   Interventions for Short Term Goal #2  Discussed with member the importance of following discharge instructions, taking medications as instructed, and attending follow up appointments in effort to decrease risk of readmission    [facility] CM Care Plan Problem Two        Patient Outreach from 08/17/2014 in [place] Problem Two  Overweight   Care Plan for Problem Two  Active   Interventions for Problem Two Long Term Goal   Discussed the importance that weight has on managing conditions (hypertension, asthma, COPD, and diabetes).     [facility] Long Term Goal (31-90) days  Member will lose 20-30 pounds within the next 90 days   [facility] Long Term Goal Start Date  08/17/14   [facility] CM Short Term Goal #1 (0-30 days)  Member will monitor weights at least 3 times a week for the next 4 weeks   [facility] CM Short Term Goal #1 Start Date  08/17/14   Interventions for Short Term Goal #2   Discussed the importance of weights to monitor progress   [facility] CM Short  Term Goal #2 (0-30 days)  Member will adjust diet to diabetic and low sodium, including portion control,  over the next 4 weeks   [facility] CM Short Term Goal #2 Start Date  08/17/14   Interventions for Short Term Goal #2  Discussed the impact following a diet has on weight loss, especially in the setting of smoking cessation     [name], BSN, [place] Manager [phone]

## 2014-08-18 NOTE — Patient Outreach (Signed)
[place] [facility]) Care Management  08/18/2014  Tiffany [name] February 09, 1960 [id number]   Request from [name], RN to assign SW, [name], LCSW assigned.  [name]. [place], [place] Management [place] Assistant Phone: [phone] Fax: [phone]

## 2014-08-20 ENCOUNTER — Other Ambulatory Visit: Payer: Self-pay | Admitting: Licensed Clinical Social Worker

## 2014-08-20 ENCOUNTER — Telehealth (HOSPITAL_COMMUNITY): Payer: Self-pay | Admitting: *Deleted

## 2014-08-20 NOTE — Patient Outreach (Signed)
[place]) Care Management  08/20/2014  LING LAWRENZ 11/30/1960 [id number]   Assessment-Patient returned call back to [place]. [name] received referral from [facility] to assist patient with housing and depression. [name] received 2 HIPPA verifications at the beginning of call. [name] informed patient reason for call and of [facility] social work services. Patient reports that she is currently living with her teenage son at her mother's house and desires to move into her own residence. [name] informed patient that [address] is currently closed but [name] has made contact to local community agencies such as [employer] and [employer]. [name] informed her that for these housing programs, she would need to live in a shelter for a few weeks before even getting to discuss that option with her case worker. [name] educated patient on [employer] which is for families. Patient reports that she would like to think about this option. Patient reports that she could afford $600-$700 rent per month but has had trouble finding a house to rent due to lack of credit and limited resources. [name] suggested that patient take initiative and go to a [employer] to look for housing online and increase socialization. Patient reports that she would consider this as well. [name] questioned patient's depressive symptoms. Patient reports, "my depression recently has became worse." Patient reports she feels her increased depressive and bipolar symptoms have occurred due to lack of independence and isolation. Patient stated that she goes to a psychiatrist at behavioral health and has an upcoming counseling session with Francee Piccolo LCSW on 09/24/14 that she is looking forward too. [name] educated patient on the importance of therapy and medication in combination with healthy life style changes to improve mood. [name] educated patient briefly on how to combat negative thoughts. Medication review took place too. Patient reports that  she does not have the funds to get her lexapro, geodon or remeron filled. [name] will see if any assistance can take place with this. [name] spent time discussing socialization with patient as she reports to not have any funds to do so. [name] suggested multiple ways to increase socialization without it costing. Self care discussion also took place. Patient agreed to therapy and socialization goals.  Plan-[name] will continue looking into available housing resources. [name] will notify Pharmacist Nicoletta Ba of medication concerns to see if any available assistance could take place. [name] will contact patient in a few weeks to assess needs.  [name], BSW, MSW, [email or web address] Phone: [phone] Fax: [phone]

## 2014-08-20 NOTE — Telephone Encounter (Signed)
Received telephone call from pharmacy. Pharmacy would like to switch Remeron 15mg  disintegrating tablets to a non dissolving tablet. Please call Janett Billow to clarify at [phone]. Pt was seen by Dr. [name] on 08/12/14.

## 2014-08-24 ENCOUNTER — Other Ambulatory Visit (HOSPITAL_COMMUNITY): Payer: Self-pay | Admitting: Psychiatry

## 2014-08-24 DIAGNOSIS — F200 Paranoid schizophrenia: Secondary | ICD-10-CM

## 2014-08-24 MED ORDER — MIRTAZAPINE 15 MG PO TABS: 15.0000 mg | ORAL_TABLET | Freq: Every day | ORAL | Status: DC

## 2014-08-25 ENCOUNTER — Encounter: Payer: Self-pay | Admitting: Family Medicine

## 2014-08-25 ENCOUNTER — Ambulatory Visit (INDEPENDENT_AMBULATORY_CARE_PROVIDER_SITE_OTHER): Payer: Commercial Managed Care - HMO | Admitting: Family Medicine

## 2014-08-25 VITALS — BP 144/82 | HR 61 | Ht 64.0 in | Wt 264.0 lb

## 2014-08-25 DIAGNOSIS — J441 Chronic obstructive pulmonary disease with (acute) exacerbation: Secondary | ICD-10-CM

## 2014-08-25 DIAGNOSIS — I10 Essential (primary) hypertension: Secondary | ICD-10-CM

## 2014-08-25 DIAGNOSIS — E118 Type 2 diabetes mellitus with unspecified complications: Secondary | ICD-10-CM

## 2014-08-25 LAB — POCT GLYCOSYLATED HEMOGLOBIN (HGB A1C): Hemoglobin A1C: 5.5

## 2014-08-25 NOTE — Patient Instructions (Addendum)
Call 1800-QUIT-NOW for help with stopping smoking and ask about nicotine patches.  I have refilled your medications. Please follow up with Dr. Valentina Lucks in his clinic for smoking cessation and your COPD.

## 2014-08-25 NOTE — Progress Notes (Signed)
Patient ID: Tiffany [name], female   DOB: 02/21/1960, 54 y.o.   MRN: [id number]    Subjective: CC: f/u HTN, COPD  HPI: Patient is a 54 y.o. female with a past medical history of HTN, COPD, asthma, DM, HLD, depression, bipolar 1, and seizurespresenting to clinic today for f/u on HTN, DM, and COPD.  Hypertension Blood pressure at home: 170s/90s   Blood pressure today: 174/96, 144/82 Meds: Compliant with verapimil and clonidine 0.2 mg TID (From EMR it appears she was instructed to increase to 0.4mg  TID previously however BPs dropped with this dosage).  Side effects: none ROS: Denies headache, dizziness, visual changes, nausea, vomiting, chest pain, abdominal pain or shortness of breath.  COPD: breathing better since hospital discharge.  No cough or sputum production.  No fevers. Currently doing symbicort and singular. Was given nicotine patch on discharge, states she's still wearing it and it is helping her with cravings. Advised patient patches are only single day use and she should discontinue this.   Social History: stop smoking, currently using the patch (got patch 6/26, still wearing)  Health Maintenance: Overdue for colonoscopy, mammogram, TDAp, eye exam, foot exam, hept C screening  ROS: All other systems reviewed and are negative.  Past Medical History Patient Active Problem List   Diagnosis Date Noted  . Tobacco abuse 07/22/2014  . COPD with acute exacerbation 07/08/2014  . COPD exacerbation 06/26/2014  . Seborrheic keratoses 12/31/2013  . Thrush 09/19/2013  . Knee pain, bilateral 01/22/2013  . Seizure 01/04/2013  . Health care maintenance 11/25/2012  . Folliculitis 0000000  . History of kidney stones 06/18/2012  . Chronic pain syndrome 06/18/2012  . Muscle spasm 06/18/2012  . Elevated serum creatinine 04/28/2012  . Asthma with acute exacerbation 04/28/2012  . Allergic reaction 04/07/2012  . Bipolar 1 disorder 12/08/2011  . Itching 09/06/2011  . HSV infection  08/30/2011  . Dyslipidemia 04/24/2011  . Anemia 04/24/2011  . Depression with anxiety 04/24/2011  . Diabetic neuropathy 04/24/2011  . OSA on CPAP 10/18/2010  . Asthma 10/18/2010  . Obesity 10/18/2010  . HTN (hypertension) 10/18/2010  . Type 2 diabetes mellitus 10/18/2010    Medications- reviewed and updated Current Outpatient Prescriptions  Medication Sig Dispense Refill  . ACCU-CHEK SOFTCLIX LANCETS lancets     . albuterol (PROVENTIL HFA;VENTOLIN HFA) 108 (90 BASE) MCG/ACT inhaler Inhale 2 puffs into the lungs every 6 (six) hours as needed. 1 Inhaler 6  . aspirin 81 MG EC tablet TAKE 1 TABLET (81 MG TOTAL) BY MOUTH DAILY. 30 tablet 0  . atorvastatin (LIPITOR) 40 MG tablet Take 1 tablet (40 mg total) by mouth daily. 90 tablet 4  . budesonide-formoterol (SYMBICORT) 160-4.5 MCG/ACT inhaler Inhale 2 puffs into the lungs 2 (two) times daily. 1 Inhaler 6  . cloNIDine (CATAPRES) 0.2 MG tablet Take 1 tablet (0.2 mg total) by mouth 3 (three) times daily. 270 tablet 3  . diphenhydrAMINE (BENADRYL) 25 mg capsule Take 50 mg by mouth every 6 (six) hours as needed.    [name] escitalopram (LEXAPRO) 20 MG tablet Take 1 and 1/2 tab daily 45 tablet 0  . fluticasone (FLONASE) 50 MCG/ACT nasal spray Place 2 sprays into both nostrils daily. (Patient not taking: Reported on 07/13/2014) 16 g 3  . furosemide (LASIX) 40 MG tablet Take 1 tablet (40 mg total) by mouth daily. 90 tablet 3  . gabapentin (NEURONTIN) 600 MG tablet Take 1 tablet (600 mg total) by mouth 3 (three) times daily. 270 tablet 4  .  metFORMIN (GLUCOPHAGE-XR) 500 MG 24 hr tablet TAKE 2 TABLETS EVERY DAY  WITH  BREAKFAST 180 tablet 3  . mirtazapine (REMERON) 15 MG tablet Take 1 tablet (15 mg total) by mouth at bedtime. 30 tablet 0  . montelukast (SINGULAIR) 10 MG tablet Take 1 tablet (10 mg total) by mouth at bedtime. 90 tablet 3  . naproxen (NAPROSYN) 500 MG tablet Take 1 tablet (500 mg total) by mouth 3 (three) times daily as needed. 270 tablet 3  .  nicotine (NICODERM CQ - DOSED IN MG/24 HOURS) 21 mg/24hr patch Place 1 patch (21 mg total) onto the skin daily. 28 patch 0  . omeprazole (PRILOSEC) 40 MG capsule Take 1 capsule (40 mg total) by mouth daily. 90 capsule 1  . oxybutynin (DITROPAN) 5 MG tablet Take 1 tablet (5 mg total) by mouth 3 (three) times daily. 270 tablet 3  . potassium chloride (K-DUR) 10 MEQ tablet Take 2 tablets (20 mEq total) by mouth daily. 180 tablet 2  . tiotropium (SPIRIVA HANDIHALER) 18 MCG inhalation capsule Place 1 capsule (18 mcg total) into inhaler and inhale daily. 30 capsule 12  . traZODone (DESYREL) 100 MG tablet Take 100 mg by mouth at bedtime.    . verapamil (VERELAN PM) 360 MG 24 hr capsule Take 1 capsule (360 mg total) by mouth at bedtime. 90 capsule 4  . ziprasidone (GEODON) 80 MG capsule Take 1 capsule (80 mg total) by mouth 2 (two) times daily with a meal. 90 capsule 0   No current facility-administered medications for this visit.    Objective: Office vital signs reviewed. BP 144/82 mmHg  Pulse 61  Ht 5\' 4"  (1.626 m)  Wt 264 lb (119.75 kg)  BMI 45.29 kg/m2   Physical Examination:  General: Awake, alert, well- nourished, NAD Eyes: Conjunctiva non-injected. PERRL.  Cardio: RRR, no m/r/g noted. No pitting edema noted Pulm: No increased WOB.  CTAB, without wheezes, rhonchi or crackles noted.  GI: soft, NT/ND,+BS x4 MSK: Normal gait and station Skin: dry, intact, no rashes or lesions  Assessment/Plan: HTN (hypertension) BP slightly elevated today on recheck. Patient very reluctant to change regimen currently.  - continue with current regimen  - follow up in 3 months.   COPD exacerbation Patient slightly confused about her regimen, but it seems she's been taking Symbicort, Singulair, and Spiriva. It also seems like she's taking albuterol scheduled rather than as needed. - continue with prescribed regimen  - congratulated pt on smoking cessation. Stressed importance of removing nicotine  patches daily due to concerns for irritation at the site.  - provided 1800Quit number again for assistance with obtaining nicotine patches  - discussed meeting with Dr. Valentina Lucks for smoking cessation. Pt also followed by Nicoletta Ba, [facility] who is assisting with medication assistance.   Type 2 diabetes mellitus A1c improved to 5.5. Was not able to appropriately cover this topic at current appt. -Pt to f/u in 1 month  -needs eye exam and foot exam.    Orders Placed This Encounter  Procedures  . POCT A1C    No orders of the defined types were placed in this encounter.    Archie Patten PGY-2, [place]

## 2014-08-26 ENCOUNTER — Other Ambulatory Visit: Payer: Self-pay | Admitting: Pharmacist

## 2014-08-26 NOTE — Patient Outreach (Signed)
[facility]) Care Management  [place]   08/26/2014  Tiffany [name] 07-Dec-1960 [id number]  Subjective: Tiffany [name] is a 54 y.o. female who was referred to pharmacy for medication assistance with Spiriva and nicotine patches.  I called patient to follow up.   Objective:   Current Medications: Current Outpatient Prescriptions  Medication Sig Dispense Refill  . ACCU-CHEK SOFTCLIX LANCETS lancets     . albuterol (PROVENTIL HFA;VENTOLIN HFA) 108 (90 BASE) MCG/ACT inhaler Inhale 2 puffs into the lungs every 6 (six) hours as needed. 1 Inhaler 6  . aspirin 81 MG EC tablet TAKE 1 TABLET (81 MG TOTAL) BY MOUTH DAILY. 30 tablet 0  . atorvastatin (LIPITOR) 40 MG tablet Take 1 tablet (40 mg total) by mouth daily. 90 tablet 4  . budesonide-formoterol (SYMBICORT) 160-4.5 MCG/ACT inhaler Inhale 2 puffs into the lungs 2 (two) times daily. 1 Inhaler 6  . cloNIDine (CATAPRES) 0.2 MG tablet Take 1 tablet (0.2 mg total) by mouth 3 (three) times daily. 270 tablet 3  . diphenhydrAMINE (BENADRYL) 25 mg capsule Take 50 mg by mouth every 6 (six) hours as needed.    [name] escitalopram (LEXAPRO) 20 MG tablet Take 1 and 1/2 tab daily 45 tablet 0  . fluticasone (FLONASE) 50 MCG/ACT nasal spray Place 2 sprays into both nostrils daily. (Patient not taking: Reported on 07/13/2014) 16 g 3  . furosemide (LASIX) 40 MG tablet Take 1 tablet (40 mg total) by mouth daily. 90 tablet 3  . gabapentin (NEURONTIN) 600 MG tablet Take 1 tablet (600 mg total) by mouth 3 (three) times daily. 270 tablet 4  . metFORMIN (GLUCOPHAGE-XR) 500 MG 24 hr tablet TAKE 2 TABLETS EVERY DAY  WITH  BREAKFAST 180 tablet 3  . mirtazapine (REMERON) 15 MG tablet Take 1 tablet (15 mg total) by mouth at bedtime. 30 tablet 0  . montelukast (SINGULAIR) 10 MG tablet Take 1 tablet (10 mg total) by mouth at bedtime. 90 tablet 3  . naproxen (NAPROSYN) 500 MG tablet Take 1 tablet (500 mg total) by mouth 3 (three) times daily as needed.  270 tablet 3  . nicotine (NICODERM CQ - DOSED IN MG/24 HOURS) 21 mg/24hr patch Place 1 patch (21 mg total) onto the skin daily. 28 patch 0  . omeprazole (PRILOSEC) 40 MG capsule Take 1 capsule (40 mg total) by mouth daily. 90 capsule 1  . oxybutynin (DITROPAN) 5 MG tablet Take 1 tablet (5 mg total) by mouth 3 (three) times daily. 270 tablet 3  . potassium chloride (K-DUR) 10 MEQ tablet Take 2 tablets (20 mEq total) by mouth daily. 180 tablet 2  . tiotropium (SPIRIVA HANDIHALER) 18 MCG inhalation capsule Place 1 capsule (18 mcg total) into inhaler and inhale daily. 30 capsule 12  . traZODone (DESYREL) 100 MG tablet Take 100 mg by mouth at bedtime.    . verapamil (VERELAN PM) 360 MG 24 hr capsule Take 1 capsule (360 mg total) by mouth at bedtime. 90 capsule 4  . ziprasidone (GEODON) 80 MG capsule Take 1 capsule (80 mg total) by mouth 2 (two) times daily with a meal. 90 capsule 0   No current facility-administered medications for this visit.    Functional Status: In your present state of health, do you have any difficulty performing the following activities: 07/31/2014 07/08/2014  Hearing? N N  Vision? Y N  Difficulty concentrating or making decisions? N N  Walking or climbing stairs? Y N  Dressing or bathing? Aggie Mo[place]  Doing errands, shopping? N N  Preparing Food and eating ? N -  Using the Toilet? N -  In the past six months, have you accidently leaked urine? N -  Do you have problems with loss of bowel control? N -  Managing your Medications? N -  Managing your Finances? N -  Housekeeping or managing your Housekeeping? Y -    Fall/Depression Screening: PHQ 2/9 Scores 08/17/2014 07/22/2014 06/26/2014 05/21/2014 12/31/2013 12/31/2013  PHQ - 2 Score 2 0 0 0 0 0  PHQ- 9 Score 4 - - - - -    Assessment: 1. Medication assistance: patient in need of assistance with Spiriva and with nicotine patches.    Plan: 1. Medication assistance:  I called the patient to discuss patient assistance programs  and I had to leave a HIPAA compliant message for patient to return my phone call.  I will reach out on 08/28/14 if the patient does not return my call today. I will refer the patient to Wellington Hampshire, care management assistant, for patient assistance program application.    Nicoletta Ba, PharmD, [place] [facility] [phone]

## 2014-08-27 ENCOUNTER — Encounter: Payer: Self-pay | Admitting: Family Medicine

## 2014-08-27 NOTE — Assessment & Plan Note (Signed)
A1c improved to 5.5. Was not able to appropriately cover this topic at current appt. -Pt to f/u in 1 month  -needs eye exam and foot exam.

## 2014-08-27 NOTE — Assessment & Plan Note (Signed)
BP slightly elevated today on recheck. Patient very reluctant to change regimen currently.  - continue with current regimen  - follow up in 3 months.

## 2014-08-27 NOTE — Patient Outreach (Signed)
[facility]) Care Management  08/27/2014  Tiffany [name] October 04, 1960 [id number]   Notification received from Nicoletta Ba, PharmD to assist patient in applying for extra help through social security.  I called Mrs. Ledesma today to enroll her into Extra Help.  She was willing to do it over the phone.  We completed the application online and submitted it today.  She should hear from social security administration in the next 2 to 4 weeks.  [name] going to call me back when he/she hears from them.  If she gets denied, I will begin the patient assistance application for Spiriva. I will follow up with her in 4 weeks.  Kassandra Meriweather L. Juliyah Mergen, [place] Care Management Assistant

## 2014-08-27 NOTE — Assessment & Plan Note (Signed)
Patient slightly confused about her regimen, but it seems she's been taking Symbicort, Singulair, and Spiriva. It also seems like she's taking albuterol scheduled rather than as needed. - continue with prescribed regimen  - congratulated pt on smoking cessation. Stressed importance of removing nicotine patches daily due to concerns for irritation at the site.  - provided [id number] number again for assistance with obtaining nicotine patches  - discussed meeting with Dr. Valentina Lucks for smoking cessation. Pt also followed by Nicoletta Ba, [facility] who is assisting with medication assistance.

## 2014-08-28 ENCOUNTER — Other Ambulatory Visit: Payer: Self-pay

## 2014-08-28 ENCOUNTER — Other Ambulatory Visit: Payer: Self-pay | Admitting: Pharmacist

## 2014-08-28 NOTE — Patient Outreach (Signed)
[facility]) Care Management  [place]   08/28/2014  Tiffany [name] 05-14-1960 [id number]  Subjective: Tiffany [name] is a 54 y.o. female who is being followed by [facility] for medication assistance.  Patient is currently working with Wellington Hampshire, care management assistant, on Extra Help application and Spiriva application.   Objective:   Current Medications: Current Outpatient Prescriptions  Medication Sig Dispense Refill  . ACCU-CHEK SOFTCLIX LANCETS lancets     . albuterol (PROVENTIL HFA;VENTOLIN HFA) 108 (90 BASE) MCG/ACT inhaler Inhale 2 puffs into the lungs every 6 (six) hours as needed. 1 Inhaler 6  . aspirin 81 MG EC tablet TAKE 1 TABLET (81 MG TOTAL) BY MOUTH DAILY. 30 tablet 0  . atorvastatin (LIPITOR) 40 MG tablet Take 1 tablet (40 mg total) by mouth daily. 90 tablet 4  . budesonide-formoterol (SYMBICORT) 160-4.5 MCG/ACT inhaler Inhale 2 puffs into the lungs 2 (two) times daily. 1 Inhaler 6  . cloNIDine (CATAPRES) 0.2 MG tablet Take 1 tablet (0.2 mg total) by mouth 3 (three) times daily. 270 tablet 3  . diphenhydrAMINE (BENADRYL) 25 mg capsule Take 50 mg by mouth every 6 (six) hours as needed.    [name] escitalopram (LEXAPRO) 20 MG tablet Take 1 and 1/2 tab daily 45 tablet 0  . fluticasone (FLONASE) 50 MCG/ACT nasal spray Place 2 sprays into both nostrils daily. (Patient not taking: Reported on 07/13/2014) 16 g 3  . furosemide (LASIX) 40 MG tablet Take 1 tablet (40 mg total) by mouth daily. 90 tablet 3  . gabapentin (NEURONTIN) 600 MG tablet Take 1 tablet (600 mg total) by mouth 3 (three) times daily. 270 tablet 4  . metFORMIN (GLUCOPHAGE-XR) 500 MG 24 hr tablet TAKE 2 TABLETS EVERY DAY  WITH  BREAKFAST 180 tablet 3  . mirtazapine (REMERON) 15 MG tablet Take 1 tablet (15 mg total) by mouth at bedtime. 30 tablet 0  . montelukast (SINGULAIR) 10 MG tablet Take 1 tablet (10 mg total) by mouth at bedtime. 90 tablet 3  . naproxen (NAPROSYN) 500 MG  tablet Take 1 tablet (500 mg total) by mouth 3 (three) times daily as needed. 270 tablet 3  . nicotine (NICODERM CQ - DOSED IN MG/24 HOURS) 21 mg/24hr patch Place 1 patch (21 mg total) onto the skin daily. 28 patch 0  . omeprazole (PRILOSEC) 40 MG capsule Take 1 capsule (40 mg total) by mouth daily. 90 capsule 1  . oxybutynin (DITROPAN) 5 MG tablet Take 1 tablet (5 mg total) by mouth 3 (three) times daily. 270 tablet 3  . potassium chloride (K-DUR) 10 MEQ tablet Take 2 tablets (20 mEq total) by mouth daily. 180 tablet 2  . tiotropium (SPIRIVA HANDIHALER) 18 MCG inhalation capsule Place 1 capsule (18 mcg total) into inhaler and inhale daily. 30 capsule 12  . traZODone (DESYREL) 100 MG tablet Take 100 mg by mouth at bedtime.    . verapamil (VERELAN PM) 360 MG 24 hr capsule Take 1 capsule (360 mg total) by mouth at bedtime. 90 capsule 4  . ziprasidone (GEODON) 80 MG capsule Take 1 capsule (80 mg total) by mouth 2 (two) times daily with a meal. 90 capsule 0   No current facility-administered medications for this visit.    Functional Status: In your present state of health, do you have any difficulty performing the following activities: 07/31/2014 07/08/2014  Hearing? N N  Vision? Y N  Difficulty concentrating or making decisions? N N  Walking or climbing  stairs? Y N  Dressing or bathing? Y N  Doing errands, shopping? N N  Preparing Food and eating ? N -  Using the Toilet? N -  In the past six months, have you accidently leaked urine? N -  Do you have problems with loss of bowel control? N -  Managing your Medications? N -  Managing your Finances? N -  Housekeeping or managing your Housekeeping? Y -    Fall/Depression Screening: PHQ 2/9 Scores 08/17/2014 07/22/2014 06/26/2014 05/21/2014 12/31/2013 12/31/2013  PHQ - 2 Score 2 0 0 0 0 0  PHQ- 9 Score 4 - - - - -    Assessment: 1. Medication assistance: patient is in need of medication assistance for Spiriva.   Plan: 1. Medication assistance:  patient will continue to work with Wellington Hampshire, care management assistant, for patient assistance applications. I will be available for further assistance as needed.    Nicoletta Ba, PharmD, [place] [facility] [phone]

## 2014-08-28 NOTE — Patient Outreach (Signed)
I called Tiffany [name] to follow up on medication assistance with lexapro, geodon, and remeron.  I had to leave a HIPPA compliant message for her to call me back.  I will follow up next week if I do not hear from her.   [name], PharmD, [place] [phone]

## 2014-09-01 ENCOUNTER — Other Ambulatory Visit: Payer: Self-pay | Admitting: Licensed Clinical Social Worker

## 2014-09-01 NOTE — Patient Outreach (Signed)
[facility]) Care Management  09/01/2014  EDI PEINADO 1960-01-31 [id number]   Assessment-[name] made outreach to patient on 09/01/14 and was successfully able to get in touch with her. HIPPA verifications were received. Patient reported "I am doing pretty good , I just need to get my medications and I think that would help me." [name] informed patient that [facility] has been trying to get in touch with her to aid in medication assistance for lexapro, geodon and remeron. [name] provided patient with Dawn Pettus's contact information and encouraged patient to contact her. Patient agreed to returning call to [place] today. Patient reported that she is looking forward to her counseling session at the beginning of next month. [name] questioned if patient had increased any socialization. Patient stated that she went out and ate a meal with a family friend and that she enjoyed it. [name] appraised this and suggested other ways for patient to decrease isolation. [name] also informed patient that if she is still interested in housing assistance that she could go to [employer] (provided address to patient), provide ID and then complete an assessment form. [name] informed patient that during the same day she would be connected to a housing coordinator to assist her in finding housing. Patient reports that she has used [employer] before and would be willing to work with them again. [name] stated address and contact information again to patient.  Plan-[name] will contact patient within 2 weeks to follow up on housing and depression management.

## 2014-09-08 ENCOUNTER — Ambulatory Visit (INDEPENDENT_AMBULATORY_CARE_PROVIDER_SITE_OTHER): Payer: Commercial Managed Care - HMO | Admitting: Pharmacist

## 2014-09-08 ENCOUNTER — Encounter: Payer: Self-pay | Admitting: Pharmacist

## 2014-09-08 VITALS — BP 146/86 | HR 62 | Ht 64.0 in | Wt 266.0 lb

## 2014-09-08 DIAGNOSIS — Z720 Tobacco use: Secondary | ICD-10-CM

## 2014-09-08 NOTE — Patient Instructions (Addendum)
Good to see you today!  Encourage you to walk as often as possible to help increase your lung function. For example, start taking short walks after dinner, etc. and increase activity as you are able. Also try chewing sugar-free gum, or using sugar-free hard candy as a way to help with the habit of having a cigarette in your mouth.  One day at a time! Keep it up!  Follow up with Dr. Lorenso Courier in 2 weeks and bring all your medications with you Follow up with the pharmacy clinic in 3-4 weeks

## 2014-09-08 NOTE — Progress Notes (Signed)
S:  Patient arrives in good spirits with her son who minimally participated during visit. Patient arrives for evaluation/assistance with tobacco dependence.   Age when started using tobacco on a daily basis: 3. Number of Cigarettes per day 0. Brand smoked unknown (full flavored menthol).    Denies waking to smoke / Smokes times per night.    Most recent quit attempt 08/18/2014 Longest time ever been tobacco free less than a week. What Medications (NRT, bupropion, varenicline) used in past includes patches.  Rates IMPORTANCE of quitting tobacco on 1-10 scale of 10. Rates READINESS of quitting tobacco on 1-10 scale of 10. Rates CONFIDENCE of quitting tobacco on 1-10 scale of 10. Patient states if her sister who is presently ill were to die, it would be a trigger to use tobacco.     A/P: Severe Nicotine Dependence of 39 years duration in a patient who is excellent candidate for success b/c she has quit 3 weeks prior to today's visit. Ran out of nicotine patches one week prior and has remained smoke-free. States she still has periodic cravings and difficulty breaking the hand-to-mouth habit. As a result, she reports a weight gain of 10 lbs. Will continue to provide support and suggested chewing sugar-free gum, toothpicks/drinking straws, or hard candy as a way to help satisfy the oral habit. Also encouraged patient to walk as often as possible and increase activity as able to help increase lung function as she reports she is breathing better.   Written information provided. F/U Rx Clinic Visit in 3 weeks. Total time in face-to-face counseling 35 minutes.  Patient seen with Stephens November, PharmD Resident.

## 2014-09-08 NOTE — Assessment & Plan Note (Addendum)
Severe Nicotine Dependence of 39 years duration in a patient who is excellent candidate for success b/c she has quit 3 weeks prior to today's visit. Ran out of nicotine patches one week prior and has remained smoke-free. States she still has periodic cravings and difficulty breaking the hand-to-mouth habit. As a result, she reports a weight gain of 10 lbs. Will continue to provide support and suggested chewing sugar-free gum, toothpicks/drinking straws, or hard candy as a way to help satisfy the oral habit. Also encouraged patient to walk as often as possible and increase activity as able to help increase lung function as she reports she is breathing better.   Written information provided. F/U Rx Clinic Visit in 3 weeks

## 2014-09-09 ENCOUNTER — Encounter: Payer: Self-pay | Admitting: Pharmacist

## 2014-09-11 ENCOUNTER — Ambulatory Visit: Payer: Self-pay

## 2014-09-11 ENCOUNTER — Other Ambulatory Visit: Payer: Self-pay | Admitting: Pharmacist

## 2014-09-11 NOTE — Patient Outreach (Signed)
[facility]) Care Management  [place]   09/11/2014  Tiffany [name] 09/11/1960 [id number]  Subjective: Tiffany [name] is a 54 y.o. female who is being followed by [facility] for medication assistance.  Patient is currently working with Wellington Hampshire, care management assistant, on Extra Help application and Spiriva application.   I spoke with Nicholas Lose, PharmD, at [place] who let me know that the patient has enough medication to last her a few weeks but that once she is out, she will not be able to afford her medications.  Objective:   Current Medications: Current Outpatient Prescriptions  Medication Sig Dispense Refill  . ACCU-CHEK SOFTCLIX LANCETS lancets     . albuterol (PROVENTIL HFA;VENTOLIN HFA) 108 (90 BASE) MCG/ACT inhaler Inhale 2 puffs into the lungs every 6 (six) hours as needed. 1 Inhaler 6  . aspirin 81 MG EC tablet TAKE 1 TABLET (81 MG TOTAL) BY MOUTH DAILY. 30 tablet 0  . atorvastatin (LIPITOR) 40 MG tablet Take 1 tablet (40 mg total) by mouth daily. 90 tablet 4  . budesonide-formoterol (SYMBICORT) 160-4.5 MCG/ACT inhaler Inhale 2 puffs into the lungs 2 (two) times daily. 1 Inhaler 6  . cloNIDine (CATAPRES) 0.2 MG tablet Take 1 tablet (0.2 mg total) by mouth 3 (three) times daily. 270 tablet 3  . diphenhydrAMINE (BENADRYL) 25 mg capsule Take 50 mg by mouth every 6 (six) hours as needed.    [name] escitalopram (LEXAPRO) 20 MG tablet Take 1 and 1/2 tab daily 45 tablet 0  . fluticasone (FLONASE) 50 MCG/ACT nasal spray Place 2 sprays into both nostrils daily. (Patient not taking: Reported on 09/08/2014) 16 g 3  . furosemide (LASIX) 40 MG tablet Take 1 tablet (40 mg total) by mouth daily. 90 tablet 3  . gabapentin (NEURONTIN) 600 MG tablet Take 1 tablet (600 mg total) by mouth 3 (three) times daily. 270 tablet 4  . metFORMIN (GLUCOPHAGE-XR) 500 MG 24 hr tablet TAKE 2 TABLETS EVERY DAY  WITH  BREAKFAST 180 tablet 3  . mirtazapine  (REMERON) 15 MG tablet Take 1 tablet (15 mg total) by mouth at bedtime. 30 tablet 0  . montelukast (SINGULAIR) 10 MG tablet Take 1 tablet (10 mg total) by mouth at bedtime. 90 tablet 3  . naproxen (NAPROSYN) 500 MG tablet Take 1 tablet (500 mg total) by mouth 3 (three) times daily as needed. 270 tablet 3  . nicotine (NICODERM CQ - DOSED IN MG/24 HOURS) 21 mg/24hr patch Place 1 patch (21 mg total) onto the skin daily. 28 patch 0  . omeprazole (PRILOSEC) 40 MG capsule Take 1 capsule (40 mg total) by mouth daily. 90 capsule 1  . oxybutynin (DITROPAN) 5 MG tablet Take 1 tablet (5 mg total) by mouth 3 (three) times daily. 270 tablet 3  . potassium chloride (K-DUR) 10 MEQ tablet Take 2 tablets (20 mEq total) by mouth daily. 180 tablet 2  . tiotropium (SPIRIVA HANDIHALER) 18 MCG inhalation capsule Place 1 capsule (18 mcg total) into inhaler and inhale daily. 30 capsule 12  . verapamil (VERELAN PM) 360 MG 24 hr capsule Take 1 capsule (360 mg total) by mouth at bedtime. 90 capsule 4  . ziprasidone (GEODON) 80 MG capsule Take 1 capsule (80 mg total) by mouth 2 (two) times daily with a meal. 90 capsule 0   No current facility-administered medications for this visit.    Functional Status: In your present state of health, do you have any  difficulty performing the following activities: 07/31/2014 07/08/2014  Hearing? N N  Vision? Y N  Difficulty concentrating or making decisions? N N  Walking or climbing stairs? Y N  Dressing or bathing? Y N  Doing errands, shopping? N N  Preparing Food and eating ? N -  Using the Toilet? N -  In the past six months, have you accidently leaked urine? N -  Do you have problems with loss of bowel control? N -  Managing your Medications? N -  Managing your Finances? N -  Housekeeping or managing your Housekeeping? Y -    Fall/Depression Screening: PHQ 2/9 Scores 08/17/2014 07/22/2014 06/26/2014 05/21/2014 12/31/2013 12/31/2013  PHQ - 2 Score 2 0 0 0 0 0  PHQ- 9 Score 4 - - -  - -    Assessment: 1. Medication assistance: patient is in need of medication assistance for her medications as she will soon be in the doughnut hole. However, she will be able to get her psych meds from [place].   Plan: 1. Medication assistance: patient will continue to work with Alexis Frock Rhodie, care management assistant, for patient assistance applications and hopefull will be able to receive assistance prior to running out of her medications. I will be available if patient needs emergency assistance for her medications.   Nicoletta Ba, PharmD, [facility] [place] [facility] [phone]

## 2014-09-14 NOTE — Progress Notes (Signed)
Patient ID: Tiffany [name], female   DOB: 1960/10/10, 54 y.o.   MRN: [id number] Reviewed: Agree with Dr. Graylin Shiver documentation and management.

## 2014-09-15 ENCOUNTER — Telehealth: Payer: Self-pay | Admitting: Family Medicine

## 2014-09-15 ENCOUNTER — Encounter (HOSPITAL_COMMUNITY): Payer: Self-pay | Admitting: Psychiatry

## 2014-09-15 ENCOUNTER — Ambulatory Visit (INDEPENDENT_AMBULATORY_CARE_PROVIDER_SITE_OTHER): Payer: Medicare HMO | Admitting: Psychiatry

## 2014-09-15 VITALS — BP 150/101 | HR 79 | Resp 16 | Wt 269.6 lb

## 2014-09-15 DIAGNOSIS — F200 Paranoid schizophrenia: Secondary | ICD-10-CM

## 2014-09-15 DIAGNOSIS — F3130 Bipolar disorder, current episode depressed, mild or moderate severity, unspecified: Secondary | ICD-10-CM

## 2014-09-15 DIAGNOSIS — F419 Anxiety disorder, unspecified: Secondary | ICD-10-CM

## 2014-09-15 MED ORDER — ZIPRASIDONE HCL 80 MG PO CAPS: 80.0000 mg | ORAL_CAPSULE | Freq: Two times a day (BID) | ORAL | Status: DC

## 2014-09-15 MED ORDER — VERAPAMIL HCL ER 360 MG PO CP24: 360.0000 mg | ORAL_CAPSULE | Freq: Every day | ORAL | Status: DC

## 2014-09-15 MED ORDER — MIRTAZAPINE 30 MG PO TABS: 30.0000 mg | ORAL_TABLET | Freq: Every day | ORAL | Status: DC

## 2014-09-15 MED ORDER — ESCITALOPRAM OXALATE 20 MG PO TABS: ORAL_TABLET | ORAL | Status: DC

## 2014-09-15 NOTE — Telephone Encounter (Signed)
Returned patient's call. [name] not having headaches or change in vision. States that her BP was elevated last night to 169/125 and per the EMR, was noted to be hight at her [facility] appt today at 150/101.   Back in July her BP at [facility] was 105/65.  She has not been taking verapamil as she could not afford this medication.  Will discuss management with pharmacist, Dr. Valentina Lucks, and find an suitable alternative.  Archie Patten, MD [facility] Family Medicine Resident  09/15/2014, 12:17 PM

## 2014-09-15 NOTE — Telephone Encounter (Signed)
Calling because she is concerned about elevation of bp.  Her numbers are as follows:  8/29  169/125 8/30 153/101

## 2014-09-15 NOTE — Progress Notes (Addendum)
[place] [phone] Progress Note  [name] [id number] 54 y.o.  09/15/2014 11:04 AM  Chief Complaint:  I like Remeron but it is not strong enough.  It was working in the beginning good but now I feel my anxiety is coming back.    History of Present Illness:  Tiffany [name] came for her follow-up appointment.  [name] a 54 year old African-American widowed unemployed female who was seen first time on July 27.  She was seeing psychiatrist at [facility] until her insurance no longer accepted there.  Patient has history of bipolar disorder depressed type, schizophrenia chronic paranoid type and anxiety disorder.  She was taking Geodon, Lexapro, Klonopin, BuSpar and trazodone however she mentioned that Klonopin BuSpar and trazodone is not helping her sleep and anxiety.  I recommended to discontinue all 3 medication and we started her on Remeron 15 mg.  Patient reported improvement in her anxiety and sleep however after few weeks she felt it is no longer working.  She started to have anxiety and nervousness.  She sleeping 3-4 hours.  She still have paranoia and she does not leave her house unless it is important but she denies any major panic attack.  She denies any feeling of hopelessness or worthlessness.  She scheduled to see therapist on September 8.  She has gained weight from the past which she believed due to smoking cessation 2 months ago.  [name] working on it and she is taking nicotine patch .  She feels proud that she is been not smoking.  She denies any hallucination or any aggressive behavior.  She lives with her 76 year old son .  She moved from [place] 2 years ago and she still trying to get her just in the community.  She has not started church but she like to start in the future.  Patient denies any side effects including any tremors, shakes, EPS at this time.  Patient denies drinking or using any illegal substances.  Her appetite is okay.  She has high blood pressure today and she  admitted noncompliant with verapamil because she is a fall in donut hole and cannot afford her medication.  Patient denies any chest pain, palpitation, shortness of breath at this time.  She promised that she will contact her primary care physician to address this issue.    Suicidal Ideation: No Plan Formed: No Patient has means to carry out plan: No  Homicidal Ideation: No Plan Formed: No Patient has means to carry out plan: No  Past Psychiatric History/Hospitalization(s): Patient endorse history of nervous breakdown in 12 when her daughter died due to cancer.  She has seen multiple psychiatrist and admitted at least 3-4 times since then.  She do not recall her last psychiatric hospitalization very well.  In the past she remembered taking Abilify, Risperdal but did not provide much detail.  As per chart she has taken Wellbutrin, Xanax in the past.  She was getting Klonopin 1 mg 3 times a day, BuSpar, Geodon 80 mg twice a day, Lexapro 30 mg daily and trazodone 300 mg from [facility].  Patient denies any history of suicidal attempt but endorsed history of suicidal thoughts.  She endorse history of paranoia, delusion, anxiety and manic-like symptoms.  She was seeing psychiatrist at [facility].  She was taking Anxiety: Yes Bipolar Disorder: Yes Depression: Yes Mania: Yes Psychosis: Yes Schizophrenia: Yes Personality Disorder: No Hospitalization for psychiatric illness: Yes History of Electroconvulsive Shock Therapy: No Prior Suicide Attempts: No  Medical History; Patient has  high blood pressure, diabetes mellitus, hyperlipidemia, stroke, seasonal allergies, sleep apnea and questionable seizures.  Her primary care physician is [facility].   Review of Systems  Constitutional: Negative for weight loss and malaise/fatigue.  Cardiovascular: Negative for chest pain and palpitations.  Musculoskeletal: Negative.   Neurological: Negative for dizziness, tremors and headaches.   Psychiatric/Behavioral: The patient is nervous/anxious and has insomnia.     Psychiatric: Agitation: No Hallucination: No Depressed Mood: No Insomnia: Yes Hypersomnia: No Altered Concentration: No Feels Worthless: No Grandiose Ideas: No Belief In Special Powers: No New/Increased Substance Abuse: No Compulsions: No  Neurologic: Headache: No Seizure: Patient reported seizures but no clear documentation Paresthesias: No   Outpatient Encounter Prescriptions as of 09/15/2014  Medication Sig  . ACCU-CHEK SOFTCLIX LANCETS lancets   . albuterol (PROVENTIL HFA;VENTOLIN HFA) 108 (90 BASE) MCG/ACT inhaler Inhale 2 puffs into the lungs every 6 (six) hours as needed.  [name] aspirin 81 MG EC tablet TAKE 1 TABLET (81 MG TOTAL) BY MOUTH DAILY.  [name] atorvastatin (LIPITOR) 40 MG tablet Take 1 tablet (40 mg total) by mouth daily.  . budesonide-formoterol (SYMBICORT) 160-4.5 MCG/ACT inhaler Inhale 2 puffs into the lungs 2 (two) times daily.  . cloNIDine (CATAPRES) 0.2 MG tablet Take 1 tablet (0.2 mg total) by mouth 3 (three) times daily.  . diphenhydrAMINE (BENADRYL) 25 mg capsule Take 50 mg by mouth every 6 (six) hours as needed.  [name] escitalopram (LEXAPRO) 20 MG tablet Take 1 and 1/2 tab daily  . fluticasone (FLONASE) 50 MCG/ACT nasal spray Place 2 sprays into both nostrils daily. (Patient not taking: Reported on 09/08/2014)  . furosemide (LASIX) 40 MG tablet Take 1 tablet (40 mg total) by mouth daily.  [name] gabapentin (NEURONTIN) 600 MG tablet Take 1 tablet (600 mg total) by mouth 3 (three) times daily.  . metFORMIN (GLUCOPHAGE-XR) 500 MG 24 hr tablet TAKE 2 TABLETS EVERY DAY  WITH  BREAKFAST  . mirtazapine (REMERON) 30 MG tablet Take 1 tablet (30 mg total) by mouth at bedtime.  . montelukast (SINGULAIR) 10 MG tablet Take 1 tablet (10 mg total) by mouth at bedtime.  . naproxen (NAPROSYN) 500 MG tablet Take 1 tablet (500 mg total) by mouth 3 (three) times daily as needed.  . nicotine (NICODERM CQ - DOSED  IN MG/24 HOURS) 21 mg/24hr patch Place 1 patch (21 mg total) onto the skin daily.  [name] omeprazole (PRILOSEC) 40 MG capsule Take 1 capsule (40 mg total) by mouth daily.  [name] oxybutynin (DITROPAN) 5 MG tablet Take 1 tablet (5 mg total) by mouth 3 (three) times daily.  . potassium chloride (K-DUR) 10 MEQ tablet Take 2 tablets (20 mEq total) by mouth daily.  [name] tiotropium (SPIRIVA HANDIHALER) 18 MCG inhalation capsule Place 1 capsule (18 mcg total) into inhaler and inhale daily.  . verapamil (VERELAN PM) 360 MG 24 hr capsule Take 1 capsule (360 mg total) by mouth at bedtime.  . ziprasidone (GEODON) 80 MG capsule Take 1 capsule (80 mg total) by mouth 2 (two) times daily with a meal.  . [DISCONTINUED] escitalopram (LEXAPRO) 20 MG tablet Take 1 and 1/2 tab daily  . [DISCONTINUED] mirtazapine (REMERON) 15 MG tablet Take 1 tablet (15 mg total) by mouth at bedtime.  . [DISCONTINUED] ziprasidone (GEODON) 80 MG capsule Take 1 capsule (80 mg total) by mouth 2 (two) times daily with a meal.   No facility-administered encounter medications on file as of 09/15/2014.    Recent Results (from the past 2160 hour(s))  Basic metabolic panel     Status: Abnormal   Collection Time: 07/07/14  5:53 PM  Result Value Ref Range   Sodium 140 135 - 145 mmol/L   Potassium 3.5 3.5 - 5.1 mmol/L   Chloride 105 101 - 111 mmol/L   CO2 28 22 - 32 mmol/L   Glucose, Bld 85 65 - 99 mg/dL   BUN 17 6 - 20 mg/dL   Creatinine, Ser 1.09 (H) 0.44 - 1.00 mg/dL   Calcium 8.6 (L) 8.9 - 10.3 mg/dL   GFR calc non Af Amer 56 (L) >60 mL/min   GFR calc Af Amer >60 >60 mL/min    Comment: (NOTE) The eGFR has been calculated using the CKD EPI equation. This calculation has not been validated in all clinical situations. eGFR's persistently <60 mL/min signify possible Chronic Kidney Disease.    Anion gap 7 5 - 15  CBC     Status: Abnormal   Collection Time: 07/07/14  5:53 PM  Result Value Ref Range   WBC 12.4 (H) 4.0 - 10.5 K/uL   RBC 3.95  3.87 - 5.11 MIL/uL   Hemoglobin 11.5 (L) 12.0 - 15.0 g/dL   HCT 35.7 (L) 36.0 - 46.0 %   MCV 90.4 78.0 - 100.0 fL   MCH 29.1 26.0 - 34.0 pg   MCHC 32.2 30.0 - 36.0 g/dL   RDW 15.2 11.5 - 15.5 %   Platelets 339 150 - 400 K/uL  BNP (order ONLY if patient complains of dyspnea/SOB AND you have documented it for THIS visit)     Status: None   Collection Time: 07/07/14  5:53 PM  Result Value Ref Range   B Natriuretic Peptide 24.4 0.0 - 100.0 pg/mL  I-stat troponin, ED  (not at [facility], [facility])     Status: None   Collection Time: 07/07/14  6:00 PM  Result Value Ref Range   Troponin i, poc 0.01 0.00 - 0.08 ng/mL   Comment 3            Comment: Due to the release kinetics of cTnI, a negative result within the first hours of the onset of symptoms does not rule out myocardial infarction with certainty. If myocardial infarction is still suspected, repeat the test at appropriate intervals.   Basic metabolic panel     Status: Abnormal   Collection Time: 07/08/14  4:30 AM  Result Value Ref Range   Sodium 140 135 - 145 mmol/L   Potassium 3.4 (L) 3.5 - 5.1 mmol/L   Chloride 104 101 - 111 mmol/L   CO2 24 22 - 32 mmol/L   Glucose, Bld 211 (H) 65 - 99 mg/dL   BUN 14 6 - 20 mg/dL   Creatinine, Ser 1.17 (H) 0.44 - 1.00 mg/dL   Calcium 8.7 (L) 8.9 - 10.3 mg/dL   GFR calc non Af Amer 51 (L) >60 mL/min   GFR calc Af Amer 60 (L) >60 mL/min    Comment: (NOTE) The eGFR has been calculated using the CKD EPI equation. This calculation has not been validated in all clinical situations. eGFR's persistently <60 mL/min signify possible Chronic Kidney Disease.    Anion gap 12 5 - 15  CBC WITH DIFFERENTIAL     Status: Abnormal   Collection Time: 07/08/14  4:30 AM  Result Value Ref Range   WBC 16.0 (H) 4.0 - 10.5 K/uL   RBC 3.89 3.87 - 5.11 MIL/uL   Hemoglobin 11.1 (L) 12.0 - 15.0 g/dL   HCT 35.6 (  L) 36.0 - 46.0 %   MCV 91.5 78.0 - 100.0 fL   MCH 28.5 26.0 - 34.0 pg   MCHC 31.2 30.0 - 36.0 g/dL   RDW 15.5  11.5 - 15.5 %   Platelets 345 150 - 400 K/uL   Neutrophils Relative % 93 (H) 43 - 77 %   Neutro Abs 14.9 (H) 1.7 - 7.7 K/uL   Lymphocytes Relative 6 (L) 12 - 46 %   Lymphs Abs 1.0 0.7 - 4.0 K/uL   Monocytes Relative 1 (L) 3 - 12 %   Monocytes Absolute 0.1 0.1 - 1.0 K/uL   Eosinophils Relative 0 0 - 5 %   Eosinophils Absolute 0.0 0.0 - 0.7 K/uL   Basophils Relative 0 0 - 1 %   Basophils Absolute 0.0 0.0 - 0.1 K/uL  Troponin I     Status: None   Collection Time: 07/08/14  4:30 AM  Result Value Ref Range   Troponin I <0.03 <0.031 ng/mL    Comment:        NO INDICATION OF MYOCARDIAL INJURY.   Glucose, capillary     Status: Abnormal   Collection Time: 07/08/14  7:47 AM  Result Value Ref Range   Glucose-Capillary 123 (H) 65 - 99 mg/dL  Troponin I     Status: None   Collection Time: 07/08/14 11:46 AM  Result Value Ref Range   Troponin I <0.03 <0.031 ng/mL    Comment:        NO INDICATION OF MYOCARDIAL INJURY.   Glucose, capillary     Status: Abnormal   Collection Time: 07/08/14 12:47 PM  Result Value Ref Range   Glucose-Capillary 134 (H) 65 - 99 mg/dL  Glucose, capillary     Status: Abnormal   Collection Time: 07/08/14  5:11 PM  Result Value Ref Range   Glucose-Capillary 124 (H) 65 - 99 mg/dL  Glucose, capillary     Status: None   Collection Time: 07/09/14  8:01 AM  Result Value Ref Range   Glucose-Capillary 82 65 - 99 mg/dL  Glucose, capillary     Status: Abnormal   Collection Time: 07/09/14 12:10 PM  Result Value Ref Range   Glucose-Capillary 100 (H) 65 - 99 mg/dL  POCT A1C     Status: None   Collection Time: 08/25/14  1:35 PM  Result Value Ref Range   Hemoglobin A1C 5.5       Constitutional:  BP 150/101 mmHg  Pulse 79  Resp 16  Wt 269 lb 9.6 oz (122.29 kg)   Musculoskeletal: Strength & Muscle Tone: within normal limits Gait & Station: normal Patient leans: N/A  Psychiatric Specialty Exam: General Appearance: Casual and Obese  Eye Contact::  Fair   Speech:  Slow  Volume:  Decreased  Mood:  Anxious  Affect:  Constricted  Thought Process:  Goal Directed  Orientation:  Full (Time, Place, and Person)  Thought Content:  Rumination  Suicidal Thoughts:  No  Homicidal Thoughts:  No  Memory:  Immediate;   Fair Recent;   Poor Remote;   Poor  Judgement:  Fair  Insight:  Fair  Psychomotor Activity:  Normal  Concentration:  Fair  Recall:  [employer] of Knowledge:  Fair  Language:  Fair  Akathisia:  No  Handed:  Right  AIMS (if indicated):     Assets:  Desire for Improvement Financial Resources/Insurance Housing Social Support  ADL's:  Intact  Cognition:  WNL  Sleep:  Established Problem, Stable/Improving (1), Review of Psycho-Social Stressors (1), Review or order clinical lab tests (1), Review and summation of old records (2), Established Problem, Worsening (2), Review of Last Therapy Session (1), Review of Medication Regimen & Side Effects (2) and Review of New Medication or Change in Dosage (2)  Assessment: Axis I: Paranoid schizophrenia chronic, bipolar disorder depressed type, anxiety disorder NOS  Axis II: Deferred  Axis III:  Past Medical History  Diagnosis Date  . High blood pressure   . Incontinence   . Anxiety   . Manic depression   . Heart attack   . Diabetes mellitus     class 2  . Asthma   . Emphysema   . High cholesterol   . Stroke   . Sinus trouble   . Seasonal allergies   . Sleep apnea   . Disorder of nervous system   . Myocardial infarct   . Complication of anesthesia     decreased bp, decreased heart rate     Plan:  I review her medication, psychosocial stressors and blood work results.  Her hemoglobin A1c is 5.3.  Recommended to try Remeron 30 mg at bedtime as patient seen improvement but the Remeron.  Continue Geodon 80 mg twice a day and Lexapro 30 mg daily.  At this time patient does not have any tremors, shakes or any EPS.  He also talk about high blood pressure.  At this time  patient does not have associated symptoms including chest pain, palpitation or any shortness of breath however I strongly encouraged to see her primary care physician for the management of hypertension.  Patient is no longer taking Klonopin , BuSpar and trazodone.  Reassurance given.  Patient is scheduled to see Joaquim Lai on September 8 for counseling.  We are still awaiting collateral information from [facility]  Patient is requesting hard copy prescription because she filled her prescription at [facility].  Discussed medication side effects especially EPS, metabolic syndrome and weight gain.  Recommended to call us back if she has any question, concern if she feels worsening of the symptom.  I will see her again in 6 weeks.  Time spent 25 minutes.  More than 50% of the time is spent in psychoeducation, counseling and correlation of care.   ARFEEN,SYED T., MD 09/15/2014

## 2014-09-15 NOTE — Telephone Encounter (Signed)
Patient's BP from her behavioral health appt is not in a dangerous range but we need to keep an eye on it (and from our discussion this AM she was asymptomatic).    Please let the patient know that from talking to Nicoletta Ba, [facility], who saw her last, she should be able to get the Verapamil for free through mail order [employer]. I have sent in the Rx for Verapamil however it can sometimes take 7-10 days for it it come in. Constance Holster with [name] has been contacted about possibly getting the patient some assistance in the meantime.    Thanks, Archie Patten, MD [facility] Family Medicine Resident  09/15/2014, 6:17 PM

## 2014-09-16 ENCOUNTER — Telehealth: Payer: Self-pay | Admitting: Family Medicine

## 2014-09-16 MED ORDER — VERAPAMIL HCL ER 360 MG PO CP24: 360.0000 mg | ORAL_CAPSULE | Freq: Every day | ORAL | Status: DC

## 2014-09-16 NOTE — Telephone Encounter (Signed)
Please let the patient know that a 30 day supply of verapamil was sent to the [facility] outpatient pharmacy. Per Nicoletta Ba, [facility], [facility] should assist in the cost until her mail order supply comes in.   Thanks, Archie Patten, MD [facility] Family Medicine Resident  09/16/2014, 12:23 PM

## 2014-09-16 NOTE — Telephone Encounter (Signed)
I have contacted [facility] to let them know that [facility] will cover the cost of the verapamil for this patient.   Nicoletta Ba, PharmD, [facility] [phone]

## 2014-09-16 NOTE — Telephone Encounter (Signed)
Patient informed, expressed understanding. 

## 2014-09-22 ENCOUNTER — Other Ambulatory Visit: Payer: Self-pay | Admitting: Licensed Clinical Social Worker

## 2014-09-22 ENCOUNTER — Encounter: Payer: Self-pay | Admitting: *Deleted

## 2014-09-22 ENCOUNTER — Other Ambulatory Visit: Payer: Self-pay | Admitting: *Deleted

## 2014-09-22 NOTE — Patient Outreach (Signed)
[facility]) Care Management  09/22/2014  METTA PINGREE 1960/05/30 [id number]  Assessment-[name] attempted outreach to patient to receive updates on [employer] and depression management. [name] was unable to reach patient but left a HIPPA compliant voice message encouraging patient to return call once available.  Plan-[name] will await call back from patient and remain available to patient for all further SW needs.  [name], BSW, MSW, [email or web address] Phone: [phone] Fax: [phone]

## 2014-09-22 NOTE — Patient Outreach (Signed)
[facility]) Care Management   09/22/2014  Tiffany [name] September 20, 1960 [id number]  Tiffany [name] is an 54 y.o. female  Subjective:   Member reports that she is "doing all right."  She denies being able to increase her activity level and has started smoking again.  She reports that she ran out of Nicotine patches and that she was unable to get a refill without a physician order.  She states that she called the office but was unable to reach someone.  Member reports that she is starting to feel more short of breath since starting cigarettes again, and will work hard to quit.  She reports that she is taking her medications as prescribed with the exception of Verapamil.  She states this medication is now at [facility] and she will obtain today.  She reports that her physicians have been wanting her to monitor her blood pressure due to increased readings at her visits.  Member states that she has not started to doing this because she does not have a working machine.     Objective:   Review of Systems  Constitutional: Negative.   HENT: Negative.   Eyes: Negative.   Respiratory: Positive for shortness of breath.        Started smoking again   Cardiovascular: Negative.   Gastrointestinal: Negative.   Genitourinary: Negative.   Musculoskeletal: Negative.   Skin: Negative.   Neurological: Negative.   Endo/Heme/Allergies: Negative.   Psychiatric/Behavioral: Negative.     Physical Exam  Constitutional: [name] oriented to person, place, and time. She appears well-developed and well-nourished.  Neck: Normal range of motion.  Cardiovascular: Normal rate, regular rhythm and normal heart sounds.   Respiratory: Effort normal and breath sounds normal.  GI: Soft. Bowel sounds are normal.  Musculoskeletal: Normal range of motion.  Neurological: [name] alert and oriented to person, place, and time.  Skin: Skin is warm and dry.   BP 142/88 mmHg  Pulse 74   Resp 20  Wt 270 lb (122.471 kg)  SpO2 98%  Current Medications:   Current Outpatient Prescriptions  Medication Sig Dispense Refill  . ACCU-CHEK SOFTCLIX LANCETS lancets     . albuterol (PROVENTIL HFA;VENTOLIN HFA) 108 (90 BASE) MCG/ACT inhaler Inhale 2 puffs into the lungs every 6 (six) hours as needed. 1 Inhaler 6  . aspirin 81 MG EC tablet TAKE 1 TABLET (81 MG TOTAL) BY MOUTH DAILY. 30 tablet 0  . atorvastatin (LIPITOR) 40 MG tablet Take 1 tablet (40 mg total) by mouth daily. 90 tablet 4  . budesonide-formoterol (SYMBICORT) 160-4.5 MCG/ACT inhaler Inhale 2 puffs into the lungs 2 (two) times daily. 1 Inhaler 6  . cloNIDine (CATAPRES) 0.2 MG tablet Take 1 tablet (0.2 mg total) by mouth 3 (three) times daily. 270 tablet 3  . diphenhydrAMINE (BENADRYL) 25 mg capsule Take 50 mg by mouth every 6 (six) hours as needed.    [name] escitalopram (LEXAPRO) 20 MG tablet Take 1 and 1/2 tab daily 45 tablet 1  . fluticasone (FLONASE) 50 MCG/ACT nasal spray Place 2 sprays into both nostrils daily. 16 g 3  . furosemide (LASIX) 40 MG tablet Take 1 tablet (40 mg total) by mouth daily. 90 tablet 3  . gabapentin (NEURONTIN) 600 MG tablet Take 1 tablet (600 mg total) by mouth 3 (three) times daily. 270 tablet 4  . metFORMIN (GLUCOPHAGE-XR) 500 MG 24 hr tablet TAKE 2 TABLETS EVERY DAY  WITH  BREAKFAST 180 tablet 3  .  mirtazapine (REMERON) 30 MG tablet Take 1 tablet (30 mg total) by mouth at bedtime. 30 tablet 1  . montelukast (SINGULAIR) 10 MG tablet Take 1 tablet (10 mg total) by mouth at bedtime. 90 tablet 3  . naproxen (NAPROSYN) 500 MG tablet Take 1 tablet (500 mg total) by mouth 3 (three) times daily as needed. 270 tablet 3  . omeprazole (PRILOSEC) 40 MG capsule Take 1 capsule (40 mg total) by mouth daily. 90 capsule 1  . oxybutynin (DITROPAN) 5 MG tablet Take 1 tablet (5 mg total) by mouth 3 (three) times daily. 270 tablet 3  . potassium chloride (K-DUR) 10 MEQ tablet Take 2 tablets (20 mEq total) by mouth daily.  180 tablet 2  . tiotropium (SPIRIVA HANDIHALER) 18 MCG inhalation capsule Place 1 capsule (18 mcg total) into inhaler and inhale daily. 30 capsule 12  . ziprasidone (GEODON) 80 MG capsule Take 1 capsule (80 mg total) by mouth 2 (two) times daily with a meal. 60 capsule 1  . nicotine (NICODERM CQ - DOSED IN MG/24 HOURS) 21 mg/24hr patch Place 1 patch (21 mg total) onto the skin daily. (Patient not taking: Reported on 09/22/2014) 28 patch 0  . verapamil (VERELAN PM) 360 MG 24 hr capsule Take 1 capsule (360 mg total) by mouth at bedtime. (Patient not taking: Reported on 09/22/2014) 30 capsule 0   No current facility-administered medications for this visit.    Functional Status:   In your present state of health, do you have any difficulty performing the following activities: 07/31/2014 07/08/2014  Hearing? N N  Vision? Y N  Difficulty concentrating or making decisions? N N  Walking or climbing stairs? Y N  Dressing or bathing? Y N  Doing errands, shopping? N N  Preparing Food and eating ? N -  Using the Toilet? N -  In the past six months, have you accidently leaked urine? N -  Do you have problems with loss of bowel control? N -  Managing your Medications? N -  Managing your Finances? N -  Housekeeping or managing your Housekeeping? Y -    Fall/Depression Screening:    PHQ 2/9 Scores 08/17/2014 07/22/2014 06/26/2014 05/21/2014 12/31/2013 12/31/2013  PHQ - 2 Score 2 0 0 0 0 0  PHQ- 9 Score 4 - - - - -    Assessment:    Discussed with member the importance of monitoring blood pressure, and the effect that elevated blood pressure has on the body over a period of time.  Member made aware that this care manager will contact social worker regarding providing a machine because the member denies being able to obtain one.  Encouraged to monitor daily, record readings, and take to physician appointments for management.  Member's pharmacy called to inquire about Nicotine patch, technician states that the  member is able to get a refill without physician reauthorization.  Member made aware that patches will be ready for pickup at [facility] on [address] per her request.  Member encouraged to call [id number] to obtain patches and to ensure that she does not run out.  Encouraged to increase activity for weight control.  Plan:   Will contact social worker regarding community resources. Routine home visit scheduled for next month.  THN CM Care Plan Problem One        Most Recent Value   Care Plan Problem One  Hypertension   Role Documenting the Problem One  Care Management [place] for Problem One  Active   THN Long Term Goal (31-90 days)  Member's blood pressure will consistently remain less than target range for the next 45 days   THN Long Term Goal Start Date  09/22/14   Interventions for Problem One Long Term Goal  Discussed with member the importance of maintaining a blood pressure within target range, taking medications, and monitoring blood pressure in order to accurately manage hypertension   THN CM Short Term Goal #1 (0-30 days)  Member will pick up Verapamil from pharmacy within the next week   THN CM Short Term Goal #1 Start Date  09/22/14   Interventions for Short Term Goal #1  Discussed with member the importance of maintaining a blood pressure within target range, taking medications, and monitoring blood pressure in order to accurately manage hypertension   THN CM Short Term Goal #2 (0-30 days)  Member will take all medications as prescribed within the next 4 weeks   THN CM Short Term Goal #2 Start Date  09/22/14   Interventions for Short Term Goal #2  Discussed with member the importance of maintaining a blood pressure within target range, taking medications, and monitoring blood pressure in order to accurately manage hypertension   THN CM Short Term Goal #3 (0-30 days)  Member will monitor and record blood pressure on a daily basis over the next 4 weeks   THN CM  Short Term Goal #3 Start Date  09/22/14   Interventions for Short Tern Goal #3  Discussed with member the importance of maintaining a blood pressure within target range, taking medications, and monitoring blood pressure in order to accurately manage hypertension    THN CM Care Plan Problem Two        Most Recent Value   Care Plan Problem Two  Overweight   Role Documenting the Problem Two  Care Management Coordinator   Care Plan for Problem Two  Active   Interventions for Problem Two Long Term Goal   Discussed the importance that weight has on managing conditions (hypertension, asthma, COPD, and diabetes).     THN Long Term Goal (31-90) days  Member will lose 20-30 pounds within the next 90 days   THN Long Term Goal Start Date  08/17/14   THN CM Short Term Goal #1 (0-30 days)  Member will monitor weights at least 3 times a week for the next 4 weeks   THN CM Short Term Goal #1 Start Date  09/22/14 [goal not met, date reset]   Interventions for Short Term Goal #2   Discussed the importance of weights to monitor progress   THN CM Short Term Goal #2 (0-30 days)  Member will adjust diet to diabetic and low sodium, including portion control, over the next 4 weeks   THN CM Short Term Goal #2 Start Date  09/22/14 Barrie Folk not met, date reset]     [name], BSN, [place] Manager [phone]

## 2014-09-24 ENCOUNTER — Encounter (HOSPITAL_COMMUNITY): Payer: Self-pay | Admitting: Clinical

## 2014-09-24 ENCOUNTER — Ambulatory Visit (INDEPENDENT_AMBULATORY_CARE_PROVIDER_SITE_OTHER): Payer: Medicare HMO | Admitting: Clinical

## 2014-09-24 DIAGNOSIS — F250 Schizoaffective disorder, bipolar type: Secondary | ICD-10-CM | POA: Diagnosis not present

## 2014-09-24 NOTE — Patient Outreach (Signed)
[facility]) Care Management  09/24/2014  MAKINLY MANGEL 03-19-1960 [id number]  Telephone outreach to patient to check the status of the extra help application and information needed for the patient assistance application. Patient states that she received a letter from social security informing her that they've received her request and they are processing it. Once she receives the final decision, she will call me. [name] also mailing me a copy of her benefits statement needed for the patient assistance application. I will follow up with the patient in about 2 weeks.  Porcha Deblanc L. Davi Kroon, [place] Care Management Assistant
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
## 2014-09-25 ENCOUNTER — Other Ambulatory Visit: Payer: Self-pay | Admitting: Family Medicine

## 2014-09-25 DIAGNOSIS — J449 Chronic obstructive pulmonary disease, unspecified: Secondary | ICD-10-CM

## 2014-09-25 MED ORDER — TIOTROPIUM BROMIDE MONOHYDRATE 18 MCG IN CAPS: 18.0000 ug | ORAL_CAPSULE | Freq: Every day | RESPIRATORY_TRACT | Status: DC

## 2014-09-29 ENCOUNTER — Other Ambulatory Visit: Payer: Self-pay | Admitting: Licensed Clinical Social Worker

## 2014-09-29 NOTE — Patient Outreach (Signed)
[facility]) Care Management  09/29/2014  Tiffany [name] 30-Apr-1960 [id number]   Assessment-[name] successfully reached patient on 09/29/14 and received HIPPA verifications. [name] had been previously informed that patient was now in need of a blood pressure monitor. Patient agreed with this need. [name] completed financial assessment and deemed her eligible for the financial assessment program through [facility]. [name] scheduled home visit where patient will be provided a blood pressure monitor. [name] questioned the counseling session that was scheduled for this month and patient reports that she had a successful counseling session with Francee Piccolo and looks forward to her 2 upcoming appointments next month on 10/22/14 and 11/03/14. Patient was referred to Francee Piccolo by Dr. [name]. Patient reported that her medications are currently effective for her. [name] questioned on socialization goal for care plan and status. Patient reports that she has been "out more than usual." [name] educated patient on the [facility] and their activities. Patient reported interest in center as she did not know she would be eligible. Patient agreeable to go to [place] to decrease isolation.  Plan-[name] will complete home visit on 10/05/14 and remain available to patient for any further needs.  [name], BSW, MSW, [email or web address] Phone: [phone] Fax: [phone]

## 2014-09-30 ENCOUNTER — Other Ambulatory Visit: Payer: Self-pay

## 2014-10-01 NOTE — Patient Outreach (Signed)
I received a call from Mammie Lorenzo, PharmD at [facility] in regards to Tiffany [name] picking up her Verapamil.  The pharmacy tried to fill it and it could not be filled under her insurance because mail order had already filled it and delivered it.  Tiffany [name] stated she called [employer] and found out it was delivered on 09/26/14.  Tiffany [name] stated she did not received it.  Tiffany [name] was suppose to pick the verapamil up 2 weeks ago and she did not.  It is not clear why.  She discussed this with her nurse care manager again on 09/22/14 and is just presenting to the pharmacy today.  Due to not knowing where her [employer] deliver is at and due to her high blood pressure and risk of complications or hospitalization, I will use the Pharm[facility] to purchase her Verapamil.  Tiffany [name] is at the pharmacy and will take it home with her.  I will have Tiffany [name], PharmD or Tiffany [name], PharmD, follow up with her as well to make sure she is taking her medications.    [name], PharmD, [place] [phone]

## 2014-10-02 NOTE — Patient Outreach (Signed)
[facility]) Care Management  10/02/2014  Tiffany [name] [birth year] [id number]   Telephone outreach to patient returning a call. I had to leave a HIPPA compliant message for her to call me back.  Huckleberry Martinson L. Bashar Milam, [place] Care Management Assistant

## 2014-10-05 ENCOUNTER — Other Ambulatory Visit: Payer: Self-pay | Admitting: Licensed Clinical Social Worker

## 2014-10-05 NOTE — Patient Outreach (Addendum)
[facility]) Care Management  [facility] Social Work  10/05/2014  Tiffany [name] 1960/12/19 [id number]   Current Medications:  Current Outpatient Prescriptions  Medication Sig Dispense Refill  . ACCU-CHEK SOFTCLIX LANCETS lancets     . albuterol (PROVENTIL HFA;VENTOLIN HFA) 108 (90 BASE) MCG/ACT inhaler Inhale 2 puffs into the lungs every 6 (six) hours as needed. 1 Inhaler 6  . aspirin 81 MG EC tablet TAKE 1 TABLET (81 MG TOTAL) BY MOUTH DAILY. 30 tablet 0  . atorvastatin (LIPITOR) 40 MG tablet Take 1 tablet (40 mg total) by mouth daily. 90 tablet 4  . budesonide-formoterol (SYMBICORT) 160-4.5 MCG/ACT inhaler Inhale 2 puffs into the lungs 2 (two) times daily. 1 Inhaler 6  . cloNIDine (CATAPRES) 0.2 MG tablet Take 1 tablet (0.2 mg total) by mouth 3 (three) times daily. 270 tablet 3  . diphenhydrAMINE (BENADRYL) 25 mg capsule Take 50 mg by mouth every 6 (six) hours as needed.    [name] escitalopram (LEXAPRO) 20 MG tablet Take 1 and 1/2 tab daily 45 tablet 1  . fluticasone (FLONASE) 50 MCG/ACT nasal spray Place 2 sprays into both nostrils daily. 16 g 3  . furosemide (LASIX) 40 MG tablet Take 1 tablet (40 mg total) by mouth daily. 90 tablet 3  . gabapentin (NEURONTIN) 600 MG tablet Take 1 tablet (600 mg total) by mouth 3 (three) times daily. 270 tablet 4  . metFORMIN (GLUCOPHAGE-XR) 500 MG 24 hr tablet TAKE 2 TABLETS EVERY DAY  WITH  BREAKFAST 180 tablet 3  . mirtazapine (REMERON) 30 MG tablet Take 1 tablet (30 mg total) by mouth at bedtime. 30 tablet 1  . montelukast (SINGULAIR) 10 MG tablet Take 1 tablet (10 mg total) by mouth at bedtime. 90 tablet 3  . naproxen (NAPROSYN) 500 MG tablet Take 1 tablet (500 mg total) by mouth 3 (three) times daily as needed. 270 tablet 3  . nicotine (NICODERM CQ - DOSED IN MG/24 HOURS) 21 mg/24hr patch Place 1 patch (21 mg total) onto the skin daily. (Patient not taking: Reported on 09/22/2014) 28 patch 0  . omeprazole (PRILOSEC) 40 MG capsule Take 1  capsule (40 mg total) by mouth daily. 90 capsule 1  . oxybutynin (DITROPAN) 5 MG tablet Take 1 tablet (5 mg total) by mouth 3 (three) times daily. 270 tablet 3  . potassium chloride (K-DUR) 10 MEQ tablet Take 2 tablets (20 mEq total) by mouth daily. 180 tablet 2  . tiotropium (SPIRIVA HANDIHALER) 18 MCG inhalation capsule Place 1 capsule (18 mcg total) into inhaler and inhale daily. 30 capsule 12  . verapamil (VERELAN PM) 360 MG 24 hr capsule Take 1 capsule (360 mg total) by mouth at bedtime. 30 capsule 0  . ziprasidone (GEODON) 80 MG capsule Take 1 capsule (80 mg total) by mouth 2 (two) times daily with a meal. 60 capsule 1   No current facility-administered medications for this visit.    Functional Status:  In your present state of health, do you have any difficulty performing the following activities: 07/31/2014 07/08/2014  Hearing? N N  Vision? Y N  Difficulty concentrating or making decisions? N N  Walking or climbing stairs? Y N  Dressing or bathing? Y N  Doing errands, shopping? N N  Preparing Food and eating ? N -  Using the Toilet? N -  In the past six months, have you accidently leaked urine? N -  Do you have problems with loss of bowel control? N -  Managing your  Medications? N -  Managing your Finances? N -  Housekeeping or managing your Housekeeping? Y -    Fall/Depression Screening:  PHQ 2/9 Scores 09/24/2014 08/17/2014 07/22/2014 06/26/2014 05/21/2014 12/31/2013 12/31/2013  PHQ - 2 Score 3 2 0 0 0 0 0  PHQ- 9 Score 16 4 - - - - -    Assessment: [name] called patient's number before going to scheduled home visit but was unable to reach patient and left a HIPPA compliant voice message. [name] arrived to patient's residence just as patient was getting into her car. Patient reported that she was unsure as to what time the scheduled appointment was today and could only talk for "a little bit." [name] provided patient with the blood pressure monitor that was requested by patient and informed  her that if she had any questions she could call her nurse, [place]. Patient reported that she is familiar with her mother's blood pressure monitor and feels she will be comfortable enough to use it. [name] questioned how therapy has been going with new therapist. Patient reported that she really likes her new therapist and was able to communicate with her very easy. Patient reports that she is looking forward to her next appointment with her therapist as well. [name] questioned if patient has went to the [facility] and patient reports that she plans on going next week. [name] questioned if she went to [employer] in order to gain stable housing as she was initially referred to [name] to assist with finding alternative housing. Patient reports that she has used them before and is familiar with there process but feels that it would be best for her to wait a few more months to save money and then relocate out of her mother's home. [name] reviewed housing resources again in case she decides to change her mind. Patient denies any other SW needs at this time and agreed to SW case closure. [name] informed patient to contact [name] if she had any questions.   Plan: [name] will inform [place] of case closure due to goals met.

## 2014-10-06 ENCOUNTER — Ambulatory Visit (INDEPENDENT_AMBULATORY_CARE_PROVIDER_SITE_OTHER): Payer: Commercial Managed Care - HMO | Admitting: Pharmacist

## 2014-10-06 ENCOUNTER — Encounter: Payer: Self-pay | Admitting: Pharmacist

## 2014-10-06 VITALS — BP 130/81 | HR 70 | Wt 270.9 lb

## 2014-10-06 DIAGNOSIS — I10 Essential (primary) hypertension: Secondary | ICD-10-CM | POA: Diagnosis not present

## 2014-10-06 NOTE — Progress Notes (Signed)
S:  Patient arrives in good spirits with noticeable congestion.   Patient arrives for evaluation/assistance with tobacco dependence.   Age when started using tobacco on a daily basis: 54 years old. Number of Cigarettes per day 20 (1 PPD) Brand smoked [employer]. Estimated Nicotine Content per Cigarette (mg) 1.2-1.6 mg.  Most recent quit attempt ~06/2014 Longest time ever been tobacco free: 2 months.  Reports starting smoking again ~3 weeks ago after insurance stopped paying for the patches. Pt reports that she cannot afford the patches OTC and reports that [facility]800[facility]QUITNOW has told her that they are only supplied to employees. Pt reports quitting smoking completely while she was on the nicotine patches for 2 months. Pt reports readiness to quit smoking and is ready to set a quit date on the day that she can nicotine patches.   Pt reports her cough and congestion worse since she resumed smoking. Reports currently using afrin long term once daily or every other day. States flonase and saline nasal spray are ineffective  Pt reports good adherence to her medications.    A/P: Severe Nicotine Dependence of 39 years duration in a patient who is good candidate for success b/c of readiness to quit again and hx quitting tobacco use with nicotine patches for 2 months.  Pt ran out of nicotine patches ~3 weeks ago due insurance payment issues/high OTC cost.  As a result pt has resumed smoking and her cough/congestion symptoms have increased. Pt is ready to quit if she could get the patches again.  Called [facility] and the patches are not covered by insurance since they are an OTC product. Called 1800-QUIT-NOW and they will provide pt with nicotine patches since she is a [place] resident if she will call them and provide information.  Instructed pt to call 1800 QUIT NOW to obtain her patches.  Set a quit date for the day that she receives her patches.  Instructed pt to use saline nasal spray PRN instead of the afrin nasal spray  for nasal congestion.     Written information provided. F/U Family Medicine Clinic Visit on 10/22/14 with Dr Lorenso Courier.  Total time in face-to-face counseling 30 minutes.  Patient seen with Roxy Manns, PharmD Candidate, Bennye Alm, PharmD Resident and Elisabeth Most, PharmD, resident.

## 2014-10-06 NOTE — Patient Instructions (Addendum)
Thank you for coming in today Please call 1-800-QUIT-NOW for Nicotine Patches today We will set a quit date for the day that you receive your patches.   Please start using nasal saline as needed for your nasal congestion instead of Afrin Nasal spray

## 2014-10-06 NOTE — Assessment & Plan Note (Signed)
Severe Nicotine Dependence of 39 years duration in a patient who is good candidate for success b/c of readiness to quit again and hx quitting tobacco use with nicotine patches for 2 months.  Pt ran out of nicotine patches ~3 weeks ago due insurance payment issues/high OTC cost.  As a result pt has resumed smoking and her cough/congestion symptoms have increased. Pt is ready to quit if she could get the patches again.  Called [facility] and the patches are not covered by insurance since they are an OTC product. Called 1800-QUIT-NOW and they will provide pt with nicotine patches since she is a  resident if she will call them and provide information.  Instructed pt to call 1800 QUIT NOW to obtain her patches.  Set a quit date for the day that she receives her patches.  Instructed pt to use saline nasal spray PRN instead of the afrin nasal spray for nasal congestion.

## 2014-10-07 NOTE — Progress Notes (Signed)
Patient ID: Tiffany [name], female   DOB: 1960-10-31, 54 y.o.   MRN: [id number] Reviewed: Agree with Dr. Graylin Shiver documentation and management.

## 2014-10-09 ENCOUNTER — Other Ambulatory Visit: Payer: Self-pay

## 2014-10-09 DIAGNOSIS — F4323 Adjustment disorder with mixed anxiety and depressed mood: Secondary | ICD-10-CM

## 2014-10-09 NOTE — Patient Outreach (Addendum)
This RNCM reached patient for community care coordination. Patient readily agreed to telephonic assessment and provided her name, address to satisfy [place] identifiers.  Patient agreed to home visit on September 29 at 12 noon for an initial home visit for further community care coordination assessment.  Patient expressed need to stop smoking, had recently quit but restarted because she could not afford the nicotine patches.      Plan: Come visit on 9/29 at 12 noon. [place] referral for Smoking Cessation Education.

## 2014-10-12 ENCOUNTER — Other Ambulatory Visit: Payer: Self-pay

## 2014-10-12 NOTE — Patient Outreach (Signed)
[facility]) Care Management  10/06/2014  AQUISHA WILBORNE 1960/05/19 [id number]   Telephone call received from patient notifying me that she received a decision letter from [facility] [employer] regarding the extra help application submitted. Patient stated she wanted me to see the letter as she did not understand what it was saying. Patient states she has an appointment at [facility] Medicine today and I told her I would meet her there to get a copy of the letter.  Maguadalupe Lata L. Rik Wadel, [place] Care Management Assistant

## 2014-10-12 NOTE — Patient Outreach (Signed)
[facility]) Care Management  10/12/2014  Tiffany [name] 14-Feb-1960 [id number]   Notification from Erenest Rasher, RN to assign Pharmacy, notification sent to [name], PharmD.  Thanks, [name]. [place], [place] Assistant Phone: [phone] Fax: [phone]

## 2014-10-12 NOTE — Patient Outreach (Signed)
[facility]) Care Management  10/12/2014  Tiffany [name] 1960-08-28 [id number]   Telephone call to patient to explain the letter of decision received regarding extra help. I explained to the patient that she was approved for full subsidy assistance for her medications. I informed her of the cost she will pay through the remaining of this year and the cost she will pay beginning 01/17/2015. Since she was approved for extra help, the patient assistance application we completed will not be processed due to the requirement of needing to being denied extra help. I am removing myself from the care management team as there is no other needs from me at this time.  [name] L. Debbe Crumble, [place] Care Management Assistant

## 2014-10-12 NOTE — Patient Outreach (Signed)
I received a referral from Loni Muse, RN, in regards to assisting Ms. Motsinger with smoking cessation.  [name] currently seeing Nicholas Lose, PharmD at [facility] for smoking cessation counseling.  I am going to defer the referral to Dr. Valentina Lucks since he is already involved.  I am happy to assist with future pharmacy issues as needed.   [name], PharmD, [place] [phone]

## 2014-10-12 NOTE — Patient Outreach (Signed)
[place] [facility]) Care Management  10/06/2014  Tiffany [name] 11-21-1960 [id number]   Met patient at [facility] to get a copy of the decision letter regarding her extra help application submitted. I told the patient once I verify the subsidy approved for, I will call and let her know and explain the letter to her in detailed.  Tiffany [name] L. Aadhav Uhlig, [place] Care Management Assistant

## 2014-10-15 ENCOUNTER — Other Ambulatory Visit: Payer: Self-pay

## 2014-10-15 NOTE — Patient Outreach (Signed)
[place] [facility]) Care Management  10/15/2014  Tiffany [name] [birth year] [id number]   This RNCM received telephone call from patient who identified herself using HIPPA identifiers of date of birth and address. Patient stated she wanted to cancel todays visit because her son is having emergency surgery and she needs to be there with him.     Patient and this RNCM agreed to make contact tomorrow to reschedule.

## 2014-10-16 ENCOUNTER — Other Ambulatory Visit: Payer: Self-pay

## 2014-10-16 ENCOUNTER — Other Ambulatory Visit: Payer: Self-pay | Admitting: *Deleted

## 2014-10-16 MED ORDER — ALBUTEROL SULFATE HFA 108 (90 BASE) MCG/ACT IN AERS: 2.0000 | INHALATION_SPRAY | Freq: Four times a day (QID) | RESPIRATORY_TRACT | Status: DC | PRN

## 2014-10-16 NOTE — Patient Outreach (Signed)
Error: Patient is assigned to [name], RN for community care coordination

## 2014-10-19 ENCOUNTER — Other Ambulatory Visit: Payer: Self-pay | Admitting: Family Medicine

## 2014-10-20 ENCOUNTER — Encounter: Payer: Self-pay | Admitting: Family Medicine

## 2014-10-20 ENCOUNTER — Ambulatory Visit (INDEPENDENT_AMBULATORY_CARE_PROVIDER_SITE_OTHER): Payer: Commercial Managed Care - HMO | Admitting: Family Medicine

## 2014-10-20 VITALS — BP 83/53 | HR 77 | Temp 98.7°F | Ht 64.0 in | Wt 267.4 lb

## 2014-10-20 DIAGNOSIS — Z720 Tobacco use: Secondary | ICD-10-CM

## 2014-10-20 DIAGNOSIS — I10 Essential (primary) hypertension: Secondary | ICD-10-CM | POA: Diagnosis not present

## 2014-10-20 DIAGNOSIS — F319 Bipolar disorder, unspecified: Secondary | ICD-10-CM

## 2014-10-20 DIAGNOSIS — J449 Chronic obstructive pulmonary disease, unspecified: Secondary | ICD-10-CM

## 2014-10-20 DIAGNOSIS — J441 Chronic obstructive pulmonary disease with (acute) exacerbation: Secondary | ICD-10-CM

## 2014-10-20 LAB — COMPREHENSIVE METABOLIC PANEL
ALT: 17 U/L (ref 6–29)
AST: 18 U/L (ref 10–35)
Albumin: 3.7 g/dL (ref 3.6–5.1)
Alkaline Phosphatase: 138 U/L — ABNORMAL HIGH (ref 33–130)
BUN: 17 mg/dL (ref 7–25)
CO2: 26 mmol/L (ref 20–31)
Calcium: 9 mg/dL (ref 8.6–10.4)
Chloride: 106 mmol/L (ref 98–110)
Creat: 1.4 mg/dL — ABNORMAL HIGH (ref 0.50–1.05)
Glucose, Bld: 79 mg/dL (ref 65–99)
Potassium: 4.6 mmol/L (ref 3.5–5.3)
Sodium: 139 mmol/L (ref 135–146)
Total Bilirubin: 0.4 mg/dL (ref 0.2–1.2)
Total Protein: 6.3 g/dL (ref 6.1–8.1)

## 2014-10-20 MED ORDER — ALBUTEROL SULFATE (2.5 MG/3ML) 0.083% IN NEBU: 2.5000 mg | INHALATION_SOLUTION | Freq: Four times a day (QID) | RESPIRATORY_TRACT | Status: DC | PRN

## 2014-10-20 MED ORDER — CLONIDINE HCL 0.1 MG PO TABS: 0.1000 mg | ORAL_TABLET | Freq: Two times a day (BID) | ORAL | Status: DC

## 2014-10-20 MED ORDER — LISINOPRIL 10 MG PO TABS: 10.0000 mg | ORAL_TABLET | Freq: Every day | ORAL | Status: DC

## 2014-10-20 MED ORDER — TIOTROPIUM BROMIDE MONOHYDRATE 18 MCG IN CAPS: 18.0000 ug | ORAL_CAPSULE | Freq: Every day | RESPIRATORY_TRACT | Status: DC

## 2014-10-20 MED ORDER — FUROSEMIDE 40 MG PO TABS: 40.0000 mg | ORAL_TABLET | Freq: Every day | ORAL | Status: DC

## 2014-10-20 NOTE — Assessment & Plan Note (Signed)
Stable. - refilled albuterol and Spiriva today - stressed smoking cessation

## 2014-10-20 NOTE — Patient Instructions (Signed)
Please decrease clonidine to 0.1mg  twice daily (you can cut your pills in half or I have written another prescription. After 1 week, you can go down to clonidine 0.1mg  daily. START taking lisinopril 10mg , we will get baseline kidney function tests and follow up with them in 2 weeks We will taper up the lisinopril as long as your kidneys tolerate it.

## 2014-10-20 NOTE — Progress Notes (Signed)
Patient ID: Tiffany [name], female   DOB: [birth year], 54 y.o.   MRN: [id number]    Subjective: CC: f/u HTN  HPI: Patient is a 54 y.o. female with a past medical history of HTN, COPD, asthma, DM, HLD, depression, bipolar 1, and seizurespresenting to clinic today for f/u on HTN and COPD.  Hypertension Blood pressure at home: doesn't check Blood pressure today: 83/53 Meds: Patient currently taking clonidine 0.2mg  BID and verapimil. Side effects:  Overly tired.  ROS: Denies headache, dizziness, visual changes, nausea, vomiting, chest pain, abdominal pain or shortness of breath. Patient concerned as her BPs are labile, we had previously discussed rebound HTN and the patient would now like to be off of clonidine if possible.   COPD: Stable,  Can walk from exam room to front door.  Ran out of Spiriva last week. Needs a Rx for albuterol as well.  Bipolar/paranoid schizophrenia: stable: Patient has received 2 letters in the mail stating her insurance will not cover her psychiatrist visits as her PCP did not refer her (I took over her care approximately 3 months ago). Would like a letter to appeal the decision and a referral to her psychiatrist, Dr. [name] and her counselor, Francee Piccolo.   Tobacco use: started back smoking approx 2 months ago. Just recently got her nicotine patches from [place] History: smokes 1ppd  Health Maintenance: Patient declines flu vaccine   ROS: All other systems reviewed and are negative.  Past Medical History Patient Active Problem List   Diagnosis Date Noted  . Type 2 diabetes mellitus with complication ([place])   . Tobacco abuse 07/22/2014  . COPD with acute exacerbation ([place]) 07/08/2014  . COPD exacerbation ([place]) 06/26/2014  . Seborrheic keratoses 12/31/2013  . Thrush 09/19/2013  . Knee pain, bilateral 01/22/2013  . Seizure ([place]) 01/04/2013  . Health care maintenance 11/25/2012  . Folliculitis 0000000  . History of kidney stones 06/18/2012  .  Chronic pain syndrome 06/18/2012  . Muscle spasm 06/18/2012  . Elevated serum creatinine 04/28/2012  . Asthma with acute exacerbation 04/28/2012  . Allergic reaction 04/07/2012  . Bipolar 1 disorder ([place]) 12/08/2011  . Itching 09/06/2011  . HSV infection 08/30/2011  . Dyslipidemia 04/24/2011  . Anemia 04/24/2011  . Depression with anxiety 04/24/2011  . Diabetic neuropathy ([place]) 04/24/2011  . OSA on CPAP 10/18/2010  . Asthma 10/18/2010  . Obesity 10/18/2010  . HTN (hypertension) 10/18/2010  . Type 2 diabetes mellitus ([place]) 10/18/2010    Medications- reviewed and updated Current Outpatient Prescriptions  Medication Sig Dispense Refill  . ACCU-CHEK SOFTCLIX LANCETS lancets     . albuterol (PROVENTIL HFA;VENTOLIN HFA) 108 (90 BASE) MCG/ACT inhaler Inhale 2 puffs into the lungs every 6 (six) hours as needed. 1 Inhaler 6  . albuterol (PROVENTIL) (2.5 MG/3ML) 0.083% nebulizer solution Take 3 mLs (2.5 mg total) by nebulization every 6 (six) hours as needed for wheezing or shortness of breath. 150 mL 1  . aspirin 81 MG EC tablet TAKE 1 TABLET (81 MG TOTAL) BY MOUTH DAILY. 30 tablet 0  . atorvastatin (LIPITOR) 40 MG tablet Take 1 tablet (40 mg total) by mouth daily. 90 tablet 4  . budesonide-formoterol (SYMBICORT) 160-4.5 MCG/ACT inhaler Inhale 2 puffs into the lungs 2 (two) times daily. 1 Inhaler 6  . cloNIDine (CATAPRES) 0.1 MG tablet Take 1 tablet (0.1 mg total) by mouth 2 (two) times daily. 60 tablet 0  . diphenhydrAMINE (BENADRYL) 25 mg capsule Take 50 mg by mouth every 6 (  six) hours as needed.    [name] escitalopram (LEXAPRO) 20 MG tablet Take 1 and 1/2 tab daily 45 tablet 1  . fluticasone (FLONASE) 50 MCG/ACT nasal spray Place 2 sprays into both nostrils daily. 16 g 3  . furosemide (LASIX) 40 MG tablet Take 1 tablet (40 mg total) by mouth daily. 90 tablet 3  . gabapentin (NEURONTIN) 600 MG tablet Take 1 tablet (600 mg total) by mouth 3 (three) times daily. 270 tablet 4  . lisinopril  (PRINIVIL,ZESTRIL) 10 MG tablet Take 1 tablet (10 mg total) by mouth daily. 30 tablet 0  . metFORMIN (GLUCOPHAGE-XR) 500 MG 24 hr tablet TAKE 2 TABLETS EVERY DAY  WITH  BREAKFAST 180 tablet 3  . mirtazapine (REMERON) 30 MG tablet Take 1 tablet (30 mg total) by mouth at bedtime. 30 tablet 1  . montelukast (SINGULAIR) 10 MG tablet Take 1 tablet (10 mg total) by mouth at bedtime. 90 tablet 3  . naproxen (NAPROSYN) 500 MG tablet Take 1 tablet (500 mg total) by mouth 3 (three) times daily as needed. 270 tablet 3  . nicotine (NICODERM CQ - DOSED IN MG/24 HOURS) 21 mg/24hr patch Place 1 patch (21 mg total) onto the skin daily. (Patient not taking: Reported on 10/09/2014) 28 patch 0  . omeprazole (PRILOSEC) 40 MG capsule Take 1 capsule (40 mg total) by mouth daily. 90 capsule 1  . oxybutynin (DITROPAN) 5 MG tablet Take 1 tablet (5 mg total) by mouth 3 (three) times daily. 270 tablet 3  . oxymetazoline (AFRIN) 0.05 % nasal spray Place 1 spray into both nostrils 2 (two) times daily.    . potassium chloride (K-DUR) 10 MEQ tablet Take 2 tablets (20 mEq total) by mouth daily. 180 tablet 2  . tiotropium (SPIRIVA HANDIHALER) 18 MCG inhalation capsule Place 1 capsule (18 mcg total) into inhaler and inhale daily. 90 capsule 4  . verapamil (VERELAN PM) 360 MG 24 hr capsule Take 1 capsule (360 mg total) by mouth at bedtime. 30 capsule 0  . ziprasidone (GEODON) 80 MG capsule Take 1 capsule (80 mg total) by mouth 2 (two) times daily with a meal. 60 capsule 1   No current facility-administered medications for this visit.    Objective: Office vital signs reviewed. BP 83/53 mmHg  Pulse 77  Temp(Src) 98.7 F (37.1 C) (Oral)  Ht 5\' 4"  (1.626 m)  Wt 267 lb 6.4 oz (121.292 kg)  BMI 45.88 kg/m2   Physical Examination:  General: Awake, alert, well- nourished, NAD Cardio: RRR, no m/r/g noted. Trace pitting edema  Pulm: No increased WOB.  CTAB, without wheezes, rhonchi or crackles noted.  GI: soft, NT/ND,+BS x4, no  hepatomegaly, no splenomegaly MSK: Normal gait and station Psych: appropriate mood and affect   Assessment/Plan: HTN (hypertension) BP low currently. Concerns for compliance given labile BPs. Patient now amendable to changing her regimen. It appears she was on lisinopril in the past however this was discontinued due to elevated SCr up to 1.5; serum creatinine was last 1.17. - Taper down clonidine to 0.1mg  BID x 1 week, then down to 0.1mg  daily x 1 week. - Started lisinopril 10mg  QD, will taper up - BMET today to assess baseline  - f/u for repeat BMET and possible titration up to lisinopril in 2 weeks.  - continue verapamil   Tobacco abuse Patient seems to be ready to stop. Urged her to set up a STOP date now that she has nicotine patches and f/u with Dr. Valentina Lucks for smoking cessation  Bipolar 1 disorder Stable. Completed a letter for [employer] and referrals placed for psychiatry and her counselor  - If problems with the appeal, suggested following up with her [facility] care manager for assistance.  COPD exacerbation Stable. - refilled albuterol and Spiriva today - stressed smoking cessation    Orders Placed This Encounter  Procedures  . Comprehensive metabolic panel    Order Specific Question:  Has the patient fasted?    Answer:  No  . Ambulatory referral to Psychiatry    Referral Priority:  Routine    Referral Type:  Psychiatric    Referral Reason:  Specialty Services Required    Requested Specialty:  Psychiatry    Number of Visits Requested:  1  . Ambulatory referral to Psychology    Referral Priority:  Routine    Referral Type:  Psychiatric    Referral Reason:  Specialty Services Required    Requested Specialty:  Psychology    Number of Visits Requested:  1    Meds ordered this encounter  Medications  . tiotropium (SPIRIVA HANDIHALER) 18 MCG inhalation capsule    Sig: Place 1 capsule (18 mcg total) into inhaler and inhale daily.    Dispense:  90 capsule     Refill:  4  . albuterol (PROVENTIL) (2.5 MG/3ML) 0.083% nebulizer solution    Sig: Take 3 mLs (2.5 mg total) by nebulization every 6 (six) hours as needed for wheezing or shortness of breath.    Dispense:  150 mL    Refill:  1  . furosemide (LASIX) 40 MG tablet    Sig: Take 1 tablet (40 mg total) by mouth daily.    Dispense:  90 tablet    Refill:  3  . cloNIDine (CATAPRES) 0.1 MG tablet    Sig: Take 1 tablet (0.1 mg total) by mouth 2 (two) times daily.    Dispense:  60 tablet    Refill:  0  . lisinopril (PRINIVIL,ZESTRIL) 10 MG tablet    Sig: Take 1 tablet (10 mg total) by mouth daily.    Dispense:  30 tablet    Refill:  [place] PGY-2, [place]

## 2014-10-20 NOTE — Assessment & Plan Note (Signed)
Stable. Completed a letter for [employer] and referrals placed for psychiatry and her counselor  - If problems with the appeal, suggested following up with her [facility] care manager for assistance.

## 2014-10-20 NOTE — Assessment & Plan Note (Signed)
BP low currently. Concerns for compliance given labile BPs. Patient now amendable to changing her regimen. It appears she was on lisinopril in the past however this was discontinued due to elevated SCr up to 1.5; serum creatinine was last 1.17. - Taper down clonidine to 0.1mg  BID x 1 week, then down to 0.1mg  daily x 1 week. - Started lisinopril 10mg  QD, will taper up - BMET today to assess baseline  - f/u for repeat BMET and possible titration up to lisinopril in 2 weeks.  - continue verapamil

## 2014-10-20 NOTE — Assessment & Plan Note (Signed)
Patient seems to be ready to stop. Urged her to set up a STOP date now that she has nicotine patches and f/u with Dr. Valentina Lucks for smoking cessation

## 2014-10-22 ENCOUNTER — Ambulatory Visit (INDEPENDENT_AMBULATORY_CARE_PROVIDER_SITE_OTHER): Payer: Medicare HMO | Admitting: Clinical

## 2014-10-22 ENCOUNTER — Other Ambulatory Visit: Payer: Self-pay | Admitting: *Deleted

## 2014-10-22 ENCOUNTER — Ambulatory Visit: Payer: Self-pay | Admitting: *Deleted

## 2014-10-22 DIAGNOSIS — F250 Schizoaffective disorder, bipolar type: Secondary | ICD-10-CM | POA: Diagnosis not present

## 2014-10-22 NOTE — Patient Outreach (Signed)
Routine home visit scheduled for this afternoon.  Member called this care manager last week to confirm appointment.  She stated that she had some things to do but she would try to be back by scheduled time.  Arrived at [facility] home today at scheduled time, member not available.  Waited 30 minutes and member still did not arrive.  Call placed to member, she apologizes for not being available, stating that she was running behind from her previous appointment.  Routine home visit scheduled for 10/18.  [name], BSN, [place] Management  [facility] Care Manager [phone]

## 2014-10-23 ENCOUNTER — Encounter: Payer: Self-pay | Admitting: *Deleted

## 2014-10-23 NOTE — Telephone Encounter (Signed)
This encounter was created in error - please disregard.

## 2014-10-27 ENCOUNTER — Encounter (HOSPITAL_COMMUNITY): Payer: Self-pay | Admitting: Psychiatry

## 2014-10-27 ENCOUNTER — Ambulatory Visit (INDEPENDENT_AMBULATORY_CARE_PROVIDER_SITE_OTHER): Payer: Medicare HMO | Admitting: Psychiatry

## 2014-10-27 DIAGNOSIS — F200 Paranoid schizophrenia: Secondary | ICD-10-CM

## 2014-10-27 DIAGNOSIS — F419 Anxiety disorder, unspecified: Secondary | ICD-10-CM | POA: Diagnosis not present

## 2014-10-27 MED ORDER — ESCITALOPRAM OXALATE 20 MG PO TABS: ORAL_TABLET | ORAL | Status: DC

## 2014-10-27 MED ORDER — MIRTAZAPINE 30 MG PO TABS: 30.0000 mg | ORAL_TABLET | Freq: Every day | ORAL | Status: DC

## 2014-10-27 MED ORDER — ZIPRASIDONE HCL 80 MG PO CAPS: 80.0000 mg | ORAL_CAPSULE | Freq: Two times a day (BID) | ORAL | Status: DC

## 2014-10-27 NOTE — Progress Notes (Signed)
[facility] Progress Note  [name] SCHOENBACHLER [id number] y.o.  10/27/2014 3:20 PM  Chief Complaint:  Medication management and follow-up.    History of Present Illness:  Tiffany [name] came for her follow-up appointment.  [name] doing better since Remeron does increase.  She denies any major panic attack.  She sleeping good.  [name] compliant with Geodon .  She denies any irritability, anger, mood swing.  She still have issues with the trust but denies any hallucination, paranoia or aggressive behavior.  [name] more social and active.  [name] able to leave her house to do things.  Her sleep is improved from the past.  [name] sleeping at least 7 hours.  She denies any panic attacks.  She denies any feeling of hopelessness or worthlessness.  She started seeing [name] and she liked her a lot and she wants to continue counseling.  Patient lives with her 25 year old son.  Patient denies drinking or using any illegal substances.  Recently she's seen her primary care physician.  Her lab shows mild elevation of creatinine.  [name] watching her appetite and she is able to lose weight from her last visit.  She has no tremors, shakes or any EPS.  Suicidal Ideation: No Plan Formed: No Patient has means to carry out plan: No  Homicidal Ideation: No Plan Formed: No Patient has means to carry out plan: No  Past Psychiatric History/Hospitalization(s): Patient has nervous breakdown in 38 when her daughter died due to cancer.  She has seen multiple psychiatrist and admitted at least 3-4 times since then.  In the past she remembered taking Abilify, Risperdal but did not provide much detail.  As per chart she has taken Wellbutrin, Xanax in the past.  She was getting Klonopin 1 mg 3 times a day, BuSpar, Geodon 80 mg twice a day, Lexapro 30 mg daily and trazodone 300 mg from [place].  Patient denies any history of suicidal attempt but endorsed history of suicidal thoughts.  She endorse history of paranoia,  delusion, anxiety and manic-like symptoms.  She was seeing psychiatrist at [facility].  She was taking Anxiety: Yes Bipolar Disorder: Yes Depression: Yes Mania: Yes Psychosis: Yes Schizophrenia: Yes Personality Disorder: No Hospitalization for psychiatric illness: Yes History of Electroconvulsive Shock Therapy: No Prior Suicide Attempts: No  Medical History; Patient has high blood pressure, diabetes mellitus, hyperlipidemia, stroke, seasonal allergies, sleep apnea and questionable seizures.  Her primary care physician is [facility].   Review of Systems  Constitutional: Negative for weight loss and malaise/fatigue.  Cardiovascular: Negative for chest pain and palpitations.  Musculoskeletal: Negative.   Neurological: Negative for dizziness, tremors and headaches.  Psychiatric/Behavioral: Negative.     Psychiatric: Agitation: No Hallucination: No Depressed Mood: No Insomnia: No Hypersomnia: No Altered Concentration: No Feels Worthless: No Grandiose Ideas: No Belief In Special Powers: No New/Increased Substance Abuse: No Compulsions: No  Neurologic: Headache: No Seizure: Patient reported seizures but no clear documentation Paresthesias: No   Outpatient Encounter Prescriptions as of 10/27/2014  Medication Sig  . ACCU-CHEK SOFTCLIX LANCETS lancets   . albuterol (PROVENTIL HFA;VENTOLIN HFA) 108 (90 BASE) MCG/ACT inhaler Inhale 2 puffs into the lungs every 6 (six) hours as needed.  [name] albuterol (PROVENTIL) (2.5 MG/3ML) 0.083% nebulizer solution Take 3 mLs (2.5 mg total) by nebulization every 6 (six) hours as needed for wheezing or shortness of breath.  [name] aspirin 81 MG EC tablet TAKE 1 TABLET (81 MG TOTAL) BY MOUTH DAILY.  [name]  atorvastatin (LIPITOR) 40 MG tablet Take 1 tablet (40 mg total) by mouth daily.  . budesonide-formoterol (SYMBICORT) 160-4.5 MCG/ACT inhaler Inhale 2 puffs into the lungs 2 (two) times daily.  . cloNIDine (CATAPRES) 0.1 MG tablet Take 1 tablet (0.1  mg total) by mouth 2 (two) times daily.  . diphenhydrAMINE (BENADRYL) 25 mg capsule Take 50 mg by mouth every 6 (six) hours as needed.  [name] escitalopram (LEXAPRO) 20 MG tablet Take 1 and 1/2 tab daily  . fluticasone (FLONASE) 50 MCG/ACT nasal spray Place 2 sprays into both nostrils daily.  . furosemide (LASIX) 40 MG tablet Take 1 tablet (40 mg total) by mouth daily.  [name] gabapentin (NEURONTIN) 600 MG tablet Take 1 tablet (600 mg total) by mouth 3 (three) times daily.  [name] lisinopril (PRINIVIL,ZESTRIL) 10 MG tablet Take 1 tablet (10 mg total) by mouth daily.  . metFORMIN (GLUCOPHAGE-XR) 500 MG 24 hr tablet TAKE 2 TABLETS EVERY DAY  WITH  BREAKFAST  . mirtazapine (REMERON) 30 MG tablet Take 1 tablet (30 mg total) by mouth at bedtime.  . montelukast (SINGULAIR) 10 MG tablet Take 1 tablet (10 mg total) by mouth at bedtime.  . naproxen (NAPROSYN) 500 MG tablet Take 1 tablet (500 mg total) by mouth 3 (three) times daily as needed.  . nicotine (NICODERM CQ - DOSED IN MG/24 HOURS) 21 mg/24hr patch Place 1 patch (21 mg total) onto the skin daily. (Patient not taking: Reported on 10/09/2014)  . omeprazole (PRILOSEC) 40 MG capsule Take 1 capsule (40 mg total) by mouth daily.  [name] oxybutynin (DITROPAN) 5 MG tablet Take 1 tablet (5 mg total) by mouth 3 (three) times daily.  [name] oxymetazoline (AFRIN) 0.05 % nasal spray Place 1 spray into both nostrils 2 (two) times daily.  . potassium chloride (K-DUR) 10 MEQ tablet Take 2 tablets (20 mEq total) by mouth daily.  [name] tiotropium (SPIRIVA HANDIHALER) 18 MCG inhalation capsule Place 1 capsule (18 mcg total) into inhaler and inhale daily.  . verapamil (VERELAN PM) 360 MG 24 hr capsule Take 1 capsule (360 mg total) by mouth at bedtime.  . ziprasidone (GEODON) 80 MG capsule Take 1 capsule (80 mg total) by mouth 2 (two) times daily with a meal.  . [DISCONTINUED] escitalopram (LEXAPRO) 20 MG tablet Take 1 and 1/2 tab daily  . [DISCONTINUED] mirtazapine (REMERON) 30 MG tablet Take 1  tablet (30 mg total) by mouth at bedtime.  . [DISCONTINUED] ziprasidone (GEODON) 80 MG capsule Take 1 capsule (80 mg total) by mouth 2 (two) times daily with a meal.   No facility-administered encounter medications on file as of 10/27/2014.    Recent Results (from the past 2160 hour(s))  POCT A1C     Status: None   Collection Time: 08/25/14  1:35 PM  Result Value Ref Range   Hemoglobin A1C 5.5   Comprehensive metabolic panel     Status: Abnormal   Collection Time: 10/20/14  2:12 PM  Result Value Ref Range   Sodium 139 135 - 146 mmol/L   Potassium 4.6 3.5 - 5.3 mmol/L   Chloride 106 98 - 110 mmol/L   CO2 26 20 - 31 mmol/L   Glucose, Bld 79 65 - 99 mg/dL   BUN 17 7 - 25 mg/dL   Creat 1.40 (H) 0.50 - 1.05 mg/dL   Total Bilirubin 0.4 0.2 - 1.2 mg/dL   Alkaline Phosphatase 138 (H) 33 - 130 U/L   AST 18 10 - 35 U/L   ALT 17 6 -  29 U/L   Total Protein 6.3 6.1 - 8.1 g/dL   Albumin 3.7 3.6 - 5.1 g/dL   Calcium 9.0 8.6 - 10.4 mg/dL      Constitutional:  There were no vitals taken for this visit.   Musculoskeletal: Strength & Muscle Tone: within normal limits Gait & Station: normal Patient leans: N/A  Psychiatric Specialty Exam: General Appearance: Casual and Obese  Eye Contact::  Fair  Speech:  Slow  Volume:  Normal  Mood:  Euthymic  Affect:  Appropriate  Thought Process:  Goal Directed  Orientation:  Full (Time, Place, and Person)  Thought Content:  WDL  Suicidal Thoughts:  No  Homicidal Thoughts:  No  Memory:  Immediate;   Fair Recent;   Poor Remote;   Poor  Judgement:  Fair  Insight:  Fair  Psychomotor Activity:  Normal  Concentration:  Fair  Recall:  [employer] of Knowledge:  Fair  Language:  Fair  Akathisia:  No  Handed:  Right  AIMS (if indicated):     Assets:  Desire for Improvement Financial Resources/Insurance Housing Social Support  ADL's:  Intact  Cognition:  WNL  Sleep:        Established Problem, Stable/Improving (1), Review or order  clinical lab tests (1), Review of Last Therapy Session (1) and Review of Medication Regimen & Side Effects (2)  Assessment: Axis I: Paranoid schizophrenia chronic, bipolar disorder depressed type, anxiety disorder NOS  Axis II: Deferred  Axis III:  Past Medical History  Diagnosis Date  . High blood pressure   . Incontinence   . Anxiety   . Manic depression ([place])   . Heart attack ([place])   . Diabetes mellitus     class 2  . Asthma   . Emphysema   . High cholesterol   . Stroke ([place])   . Sinus trouble   . Seasonal allergies   . Sleep apnea   . Disorder of nervous system   . Myocardial infarct ([place])   . Complication of anesthesia     decreased bp, decreased heart rate     Plan:  Patient is doing better on her current psychotropic medication.  She has no side effects.  I will continue Geodon 80 mg twice a day, Lexapro 30 mg daily and Remeron 30 mg at bedtime.  Discussed medication side effects and benefits.  Encouraged to keep appointment with [facility] for counseling and therapy.  Recommended to call us back if she has any question or any concern.  Follow-up in 3 months.  Kwadwo Taras T., MD 10/27/2014

## 2014-10-27 NOTE — Progress Notes (Signed)
   THERAPIST PROGRESS NOTE  Session Time: 11:10 - 12:00  Participation Level: Active  Behavioral Response: CasualAlertDepressed  Type of Therapy: Individual Therapy  Treatment Goals addressed: improve psychiatric symptoms,   Mood stabilization,  stress management  Interventions:  Motivational Interviewing, Grounding and Mindfulness Techniques  Summary: Tiffany [name] is a 54 y.o. female who presents with schizoaffective disorder, bipolar type  Suicidal/Homicidal: No -without intent/plan  Therapist Response: Tiffany [name] met with clinician for an individual session. Tiffany [name] discussed her psychiatric symptoms and her current life. Tiffany [name] and clinician discussed her goals for therapy. Tiffany [name] shared that she feels like her medication is working pretty well for her. She shared about some of the stressful things in her life. She shared that it was sometimes challenging to manage her moods. Tiffany [name] gave examples from her life. Clinician introduced grounding and mindfulness techniques. Clinician explained the practice, purpose and application of the techniques. Tiffany [name] and clinician discussed some of the techniques in detail. Client and clinician practiced the techniques together. Tiffany [name] stated that she would be willing to practice the techniques daily until next session.  Plan: Return again in 2-3 weeks.  Diagnosis: Axis I:   Schizoaffective disorder, bipolar type    Destenie Ingber A, LCSW 10/27/2014

## 2014-10-28 ENCOUNTER — Encounter (HOSPITAL_COMMUNITY): Payer: Self-pay | Admitting: Clinical

## 2014-10-29 ENCOUNTER — Telehealth (HOSPITAL_COMMUNITY): Payer: Self-pay

## 2014-10-29 NOTE — Telephone Encounter (Signed)
Medication management - Telephone message left for patient that new orders for her Geodon and Remeron were e-scribed to [facility] by Dr. [name] when she was evaluated on 10/27/14.  Requested pt. call back if any more questions.

## 2014-10-30 ENCOUNTER — Telehealth (HOSPITAL_COMMUNITY): Payer: Self-pay

## 2014-10-30 DIAGNOSIS — F200 Paranoid schizophrenia: Secondary | ICD-10-CM

## 2014-10-30 MED ORDER — ESCITALOPRAM OXALATE 20 MG PO TABS: ORAL_TABLET | ORAL | Status: DC

## 2014-10-30 NOTE — Telephone Encounter (Signed)
Medication management - Called [facility] to see if medications had been mailed to patient yet per patient and Dr. [name] request after pt. called wanting to [facility] to [facility] [address].  Spoke with Velna Hatchet and then Baker Janus, pharmacist at [facility] who reported patient's Geodon and Remeron had already been shipped but she would cancel out Lexapro order per patient request and Dr. Marguerite Olea authorization.  Called patient back to inform the only order that was able to be canceled was her Lexapro and agreed to [facility] [address] per patient request and authorized by Dr. [name].  Patient's order for Lexapro from 10/27/14 to [facility] discontinued as verified by their pharmacist Baker Janus and e-scribed in a new order for Lexapro  20 mg, 1 and 1/2 a day, #45 plus 2 refills [facility] [address] and confirmed received.  Patient aware Geodon and Remeron are being shipped from [facility] and she will pick [facility]apro from [facility].  Patient to call back if any problems getting medications filled.

## 2014-11-01 ENCOUNTER — Encounter (HOSPITAL_COMMUNITY): Payer: Self-pay | Admitting: *Deleted

## 2014-11-01 ENCOUNTER — Emergency Department (HOSPITAL_COMMUNITY)
Admission: EM | Admit: 2014-11-01 | Discharge: 2014-11-01 | Disposition: A | Discharge status: Home / Self Care | Payer: Commercial Managed Care - HMO | Attending: Emergency Medicine | Admitting: Emergency Medicine

## 2014-11-01 DIAGNOSIS — X58XXXA Exposure to other specified factors, initial encounter: Secondary | ICD-10-CM | POA: Diagnosis not present

## 2014-11-01 DIAGNOSIS — Z720 Tobacco use: Secondary | ICD-10-CM | POA: Insufficient documentation

## 2014-11-01 DIAGNOSIS — Z8673 Personal history of transient ischemic attack (TIA), and cerebral infarction without residual deficits: Secondary | ICD-10-CM | POA: Insufficient documentation

## 2014-11-01 DIAGNOSIS — Y9389 Activity, other specified: Secondary | ICD-10-CM | POA: Insufficient documentation

## 2014-11-01 DIAGNOSIS — E119 Type 2 diabetes mellitus without complications: Secondary | ICD-10-CM | POA: Insufficient documentation

## 2014-11-01 DIAGNOSIS — J45909 Unspecified asthma, uncomplicated: Secondary | ICD-10-CM | POA: Insufficient documentation

## 2014-11-01 DIAGNOSIS — E7800 Pure hypercholesterolemia, unspecified: Secondary | ICD-10-CM | POA: Insufficient documentation

## 2014-11-01 DIAGNOSIS — Z7982 Long term (current) use of aspirin: Secondary | ICD-10-CM | POA: Diagnosis not present

## 2014-11-01 DIAGNOSIS — Y998 Other external cause status: Secondary | ICD-10-CM | POA: Diagnosis not present

## 2014-11-01 DIAGNOSIS — Z8669 Personal history of other diseases of the nervous system and sense organs: Secondary | ICD-10-CM | POA: Insufficient documentation

## 2014-11-01 DIAGNOSIS — S99921A Unspecified injury of right foot, initial encounter: Secondary | ICD-10-CM | POA: Diagnosis present

## 2014-11-01 DIAGNOSIS — I252 Old myocardial infarction: Secondary | ICD-10-CM | POA: Diagnosis not present

## 2014-11-01 DIAGNOSIS — S90414A Abrasion, right lesser toe(s), initial encounter: Secondary | ICD-10-CM | POA: Diagnosis not present

## 2014-11-01 DIAGNOSIS — Z79899 Other long term (current) drug therapy: Secondary | ICD-10-CM | POA: Insufficient documentation

## 2014-11-01 DIAGNOSIS — F419 Anxiety disorder, unspecified: Secondary | ICD-10-CM | POA: Insufficient documentation

## 2014-11-01 DIAGNOSIS — Y9289 Other specified places as the place of occurrence of the external cause: Secondary | ICD-10-CM | POA: Insufficient documentation

## 2014-11-01 DIAGNOSIS — Z7951 Long term (current) use of inhaled steroids: Secondary | ICD-10-CM | POA: Diagnosis not present

## 2014-11-01 DIAGNOSIS — F319 Bipolar disorder, unspecified: Secondary | ICD-10-CM | POA: Insufficient documentation

## 2014-11-01 NOTE — ED Notes (Signed)
Pt "tore toe nail to short" .  Bleeding has stopped, does not want to take the Band-Aid off.

## 2014-11-01 NOTE — ED Notes (Signed)
Her rt 4 th toenail came off tonight and the toe will not stop bleeding.  Pt is a diabetic

## 2014-11-01 NOTE — ED Provider Notes (Signed)
CSN: [id number]     Arrival date & time 11/01/14  0116 History  By signing my name below, I, Randa Evens, attest that this documentation has been prepared under the direction and in the presence of No att. providers found. Electronically Signed: Randa Evens, ED Scribe. 11/01/2014. 7:15 AM.     Chief Complaint  Patient presents with  . Toe Injury   The history is provided by the patient. No language interpreter was used.   HPI Comments: Tiffany [name] is a 54 y.o. female with PMHx of DM who presents to the Emergency Department complaining of 4th right toe injury onset tonight PTA. Pt states that she was picking at the nail and some of the toe nail came off. Pt states she was not able to control the bleeding. Pt states that she put a band-aid on it and the bleeding was controlled upon arrival to the ED. Pt denies anticoagulant use.   Past Medical History  Diagnosis Date  . High blood pressure   . Incontinence   . Anxiety   . Manic depression ([place])   . Heart attack ([place])   . Diabetes mellitus     class 2  . Asthma   . Emphysema   . High cholesterol   . Stroke ([place])   . Sinus trouble   . Seasonal allergies   . Sleep apnea   . Disorder of nervous system   . Myocardial infarct ([place])   . Complication of anesthesia     decreased bp, decreased heart rate   Past Surgical History  Procedure Laterality Date  . Abdominal hysterectomy    . Cesarean section    . Hiatal hernia repair     Family History  Problem Relation Age of Onset  . Cancer Father     prostate  . Heart failure      cousin  . Cancer Mother     lung  . Depression Mother   . Depression Sister   . Anxiety disorder Sister   . Schizophrenia Sister   . Bipolar disorder Sister   . Depression Sister   . Depression Brother    Social History  Substance Use Topics  . Smoking status: Current Every Day Smoker -- 0.50 packs/day for 37 years    Types: Cigarettes    Start date: 01/16/1977    Last Attempt to  Quit: 08/17/2014  . Smokeless tobacco: Never Used     Comment: Previous 2 PPD menthol full flavor  . Alcohol Use: No   OB History    No data available     Review of Systems  Skin: Positive for wound.  Neurological: Negative for numbness.     Allergies  Hydroxyzine; Codeine; and Sulfa antibiotics  Home Medications   Prior to Admission medications   Medication Sig Start Date End Date Taking? Authorizing Provider  ACCU-CHEK SOFTCLIX LANCETS lancets  07/28/14   Historical Provider, MD  albuterol (PROVENTIL HFA;VENTOLIN HFA) 108 (90 BASE) MCG/ACT inhaler Inhale 2 puffs into the lungs every 6 (six) hours as needed. 10/16/14   Archie Patten, MD  albuterol (PROVENTIL) (2.5 MG/3ML) 0.083% nebulizer solution Take 3 mLs (2.5 mg total) by nebulization every 6 (six) hours as needed for wheezing or shortness of breath. 10/20/14   Archie Patten, MD  aspirin 81 MG EC tablet TAKE 1 TABLET (81 MG TOTAL) BY MOUTH DAILY. 08/03/14   Archie Patten, MD  atorvastatin (LIPITOR) 40 MG tablet Take 1 tablet (40 mg total) by  mouth daily. 03/27/14   [place], MD  budesonide-formoterol [facility]) 160-4.5 MCG/ACT inhaler Inhale 2 puffs into the lungs 2 (two) times daily. 07/22/14   Virginia Crews, MD  cloNIDine (CATAPRES) 0.1 MG tablet Take 1 tablet (0.1 mg total) by mouth 2 (two) times daily. 10/20/14   Archie Patten, MD  diphenhydrAMINE (BENADRYL) 25 mg capsule Take 50 mg by mouth every 6 (six) hours as needed.    Historical Provider, MD  escitalopram (LEXAPRO) 20 MG tablet Take 1 and 1/2 tab daily 10/30/14   Kathlee Nations, MD  fluticasone (FLONASE) 50 MCG/ACT nasal spray Place 2 sprays into both nostrils daily. 12/31/13   [place], MD  furosemide (LASIX) 40 MG tablet Take 1 tablet (40 mg total) by mouth daily. 10/20/14   Archie Patten, MD  gabapentin (NEURONTIN) 600 MG tablet Take 1 tablet (600 mg total) by mouth 3 (three) times daily. 03/27/14   Sharon [place] [address], MD   lisinopril (PRINIVIL,ZESTRIL) 10 MG tablet Take 1 tablet (10 mg total) by mouth daily. 10/20/14   Archie Patten, MD  metFORMIN (GLUCOPHAGE-XR) 500 MG 24 hr tablet TAKE 2 TABLETS EVERY DAY  WITH  BREAKFAST 08/10/14   Archie Patten, MD  mirtazapine (REMERON) 30 MG tablet Take 1 tablet (30 mg total) by mouth at bedtime. 10/27/14 10/27/15  Kathlee Nations, MD  montelukast (SINGULAIR) 10 MG tablet Take 1 tablet (10 mg total) by mouth at bedtime. 07/22/14   Virginia Crews, MD  naproxen (NAPROSYN) 500 MG tablet Take 1 tablet (500 mg total) by mouth 3 (three) times daily as needed. 07/22/14   Virginia Crews, MD  nicotine (NICODERM CQ - DOSED IN MG/24 HOURS) 21 mg/24hr patch Place 1 patch (21 mg total) onto the skin daily. Patient not taking: Reported on 10/09/2014 07/22/14   Virginia Crews, MD  omeprazole (PRILOSEC) 40 MG capsule Take 1 capsule (40 mg total) by mouth daily. 07/22/14   Virginia Crews, MD  oxybutynin (DITROPAN) 5 MG tablet Take 1 tablet (5 mg total) by mouth 3 (three) times daily. 07/22/14   Virginia Crews, MD  oxymetazoline (AFRIN) 0.05 % nasal spray Place 1 spray into both nostrils 2 (two) times daily.    Historical Provider, MD  potassium chloride (K-DUR) 10 MEQ tablet Take 2 tablets (20 mEq total) by mouth daily. 03/27/14   Marty, MD  tiotropium (SPIRIVA HANDIHALER) 18 MCG inhalation capsule Place 1 capsule (18 mcg total) into inhaler and inhale daily. 10/20/14   Archie Patten, MD  verapamil (VERELAN PM) 360 MG 24 hr capsule Take 1 capsule (360 mg total) by mouth at bedtime. 09/16/14   Archie Patten, MD  ziprasidone (GEODON) 80 MG capsule Take 1 capsule (80 mg total) by mouth 2 (two) times daily with a meal. 10/27/14   Kathlee Nations, MD   BP 135/65 mmHg  Pulse 90  Temp(Src) 98.3 F (36.8 C)  Resp 18  Ht 5\' 4"  (1.626 m)  Wt 267 lb 2 oz (121.167 kg)  BMI 45.83 kg/m2  SpO2 98%   Physical Exam  Constitutional: [name] oriented to person, place, and  time. She appears well-developed and well-nourished. No distress.  HENT:  Head: Normocephalic and atraumatic.  Eyes: Conjunctivae and EOM are normal.  Neck: Neck supple. No tracheal deviation present.  Cardiovascular: Normal rate.   Pulmonary/Chest: Effort normal. No respiratory distress.  Musculoskeletal: Normal range of motion.  Dried blood on 4th  toe of right foot, small abrasion just distal to the nail, no other wounds or surrounding erythema   Neurological: [name] alert and oriented to person, place, and time.  Skin: Skin is warm and dry.  Psychiatric: She has a normal mood and affect. Her behavior is normal.  Nursing note and vitals reviewed.   ED Course  Procedures (including critical care time) DIAGNOSTIC STUDIES: Oxygen Saturation is 96% on RA, normal by my interpretation.    COORDINATION OF CARE: 2:18 AM-Discussed treatment plan with pt at bedside and pt agreed to plan.    Labs Review Labs Reviewed - No data to display  Imaging Review No results found.    EKG Interpretation None      MDM   Final diagnoses:  Toe abrasion, right, initial encounter    Current toe with persistent bleeding afterwards and applied a bandage and came here for evaluation secondary to bleeding the bandage. On my exam she has no active bleeding wound was cleaned and antibiotic ointment applied. Discussed with the patient that secondary to her diabetes she is a high risk for infection and she need to keep a clean and dry as much as possible using warm soapy water. Follow-up with doctor for further evaluation.   I have personally and contemperaneously reviewed labs and imaging and used in my decision making as above.   A medical screening exam was performed and I feel the patient has had an appropriate workup for their chief complaint at this time and likelihood of emergent condition existing is low. They have been counseled on decision, discharge, follow up and which symptoms necessitate  immediate return to the emergency department. They or their family verbally stated understanding and agreement with plan and discharged in stable condition.    I personally performed the services described in this documentation, which was scribed in my presence. The recorded information has been reviewed and is accurate.      Merrily Pew, MD 11/01/14 [phone]

## 2014-11-01 NOTE — Discharge Instructions (Signed)
U have a small abrasion of her toe. This should heal well however with your diabetes and may take longer than normal. Make sure you keep the area clean and dry and follow-up with her doctor for wound check in approximately one week.

## 2014-11-03 ENCOUNTER — Encounter (HOSPITAL_COMMUNITY): Payer: Self-pay | Admitting: Clinical

## 2014-11-03 ENCOUNTER — Ambulatory Visit (INDEPENDENT_AMBULATORY_CARE_PROVIDER_SITE_OTHER): Payer: Medicare HMO | Admitting: Clinical

## 2014-11-03 ENCOUNTER — Ambulatory Visit: Payer: Self-pay | Admitting: *Deleted

## 2014-11-03 DIAGNOSIS — F250 Schizoaffective disorder, bipolar type: Secondary | ICD-10-CM | POA: Diagnosis not present

## 2014-11-03 NOTE — Progress Notes (Signed)
   THERAPIST PROGRESS NOTE  Session Time: 12:10 - 12:56  Participation Level: Active  Behavioral Response: CasualAlertNA  Type of Therapy: Individual Therapy  Treatment Goals addressed: improve psychiatric symptoms, Improve unhelpful thought patterns,  Mood stabilization,  stress management  Interventions: Motivational Interviewing, Grounding and Mindfulness Techniques  Summary: Tiffany [name] is a 54 y.o. female who presents with schizoaffective disorder, bipolar type  Suicidal/Homicidal: No -without intent/plan  Therapist Response: [name] met with clinician for an individual session. [name] discussed her psychiatric symptoms, her current life events and her homework. She shared that  she is doing well this week. [name] shared a about her relationship with her son. She shared that they have a good relationship and that he treats her well.  She shared that since her son has gotten his driver's permit she has been riding with him to various places which has kept her out of the house more. She reported that this is helped her mood and her motivation to get up and dressed etc. [name] and clinician discussed her grounding techniques and mindfulness techniques. [name] shared that she did not practice the techniques she shared that she would like to try a new technique. Client and clinician discussed grounding techniques she would like to have. [name] shared that she would like something simpler. Clinician introduced her to progressive body relaxation through visualizing and breathing. Clinician walked her through the relaxation technique first. Clinician then had [name] walk clinician to the relaxation technique. [name] shared that she was likely to practice this technique because it reduced her stress imediately and it was easy to do anything where she. Client and clinician discussed how [name] could use it both to relieve stress or as a preventive to help her maintain a stable mood.  [name] shared that she was going  to quit smoking and planned to use it in place of smoking. [name] agreed to practice the technique daily until next session.  Plan: Return again in 2-3 weeks.  Diagnosis: Axis I:   Schizoaffective disorder, bipolar type   Ryelan Kazee A, LCSW 11/03/2014

## 2014-11-04 ENCOUNTER — Telehealth (HOSPITAL_COMMUNITY): Payer: Self-pay | Admitting: *Deleted

## 2014-11-04 NOTE — Telephone Encounter (Signed)
Prior authorization for escitalopram received. Submitted online with cover my meds. Awaiting decision to be faxed.

## 2014-11-05 ENCOUNTER — Ambulatory Visit: Payer: Self-pay | Admitting: Pharmacist

## 2014-11-06 ENCOUNTER — Other Ambulatory Visit: Payer: Self-pay | Admitting: *Deleted

## 2014-11-06 ENCOUNTER — Telehealth (HOSPITAL_COMMUNITY): Payer: Self-pay | Admitting: *Deleted

## 2014-11-06 NOTE — Patient Outreach (Signed)
Call placed to member to reschedule this week's missed home visit.  Home visit rescheduled for next month.  Member denies any urgent needs at this time.  She states that she will need a refill for her Spiriva within the next couple weeks, but her mail order pharmacy [facility]) states that they are in need of a new prescription.  Instructed member to inform her PCP of this request during her visit next week.  Encouraged member to contact this care manager with any questions or concerns.  [name], BSN, [facility]  [facility] Care Manager [phone]

## 2014-11-06 NOTE — Telephone Encounter (Signed)
Called [employer] back to confirm diagnosis for patient for prior authorization of medication Lexapro.

## 2014-11-10 ENCOUNTER — Ambulatory Visit: Payer: Self-pay | Admitting: Family Medicine

## 2014-11-11 ENCOUNTER — Ambulatory Visit (HOSPITAL_COMMUNITY): Payer: Self-pay | Admitting: Clinical

## 2014-11-12 ENCOUNTER — Other Ambulatory Visit: Payer: Self-pay | Admitting: Family Medicine

## 2014-11-12 NOTE — Telephone Encounter (Signed)
Pt called and needs a refill on her Verapamil called in to her [facility] and this must be in a 90 day supply for her insurance to cover this. jw

## 2014-11-13 MED ORDER — VERAPAMIL HCL ER 360 MG PO CP24: 360.0000 mg | ORAL_CAPSULE | Freq: Every day | ORAL | Status: DC

## 2014-11-13 NOTE — Telephone Encounter (Signed)
Spoke with pt.  [name] agreeable to appointment, but she will need a 2 pm appt (transportation purposes).  She will call back in two weeks to check Decembers schedule as nothing avaliable in November. Tiffany [name], [name]

## 2014-11-13 NOTE — Telephone Encounter (Signed)
Rx refilled. Please contact the patient and let her know that she needs to make a follow up appt ASAP with myself or someone else to assess her kidney function since we started a new medication at her last visit.  Thanks, Archie Patten, MD [facility] Family Medicine Resident  11/13/2014, 8:30 AM

## 2014-11-23 ENCOUNTER — Other Ambulatory Visit: Payer: Self-pay | Admitting: *Deleted

## 2014-11-23 ENCOUNTER — Ambulatory Visit: Payer: Self-pay | Admitting: *Deleted

## 2014-11-23 NOTE — Patient Outreach (Signed)
Member scheduled for routine home visit at 1pm.  Prior to traveling to [facility] home, call placed to member to verify she would be home and available for home visit.  No answer, HIPPA compliant voice message left.  This care manager proceeded with home visit.  Arrived at [facility] home, member's mother answered the door and stated that the member was unavailable, and that she would have member call this care manager back.  Will call member later this week to reschedule home visit.  [name], BSN, [place] Management  [facility] Care Manager [phone]

## 2014-11-24 ENCOUNTER — Telehealth (HOSPITAL_COMMUNITY): Payer: Self-pay

## 2014-11-24 ENCOUNTER — Other Ambulatory Visit (HOSPITAL_COMMUNITY): Payer: Self-pay | Admitting: Psychiatry

## 2014-11-24 MED ORDER — AMITRIPTYLINE HCL 25 MG PO TABS: 25.0000 mg | ORAL_TABLET | Freq: Every day | ORAL | Status: DC

## 2014-11-24 NOTE — Progress Notes (Signed)
I returned patient's phone call.  [name] complaining of poor sleep.  She does not feel Remeron is working.  She does not want to go back on trazodone.  I recommended to try amitriptyline 25 mg at bedtime.  I also suggested to stop Remeron.  [name] oriented taking Lexapro and Geodon.  Discussed polypharmacy.  Discussed medication side effects.  Recommended to call us back if she has any concerns or any side effects.

## 2014-12-03 ENCOUNTER — Telehealth: Payer: Self-pay | Admitting: Family Medicine

## 2014-12-03 ENCOUNTER — Other Ambulatory Visit: Payer: Self-pay | Admitting: *Deleted

## 2014-12-03 ENCOUNTER — Other Ambulatory Visit: Payer: Self-pay | Admitting: Family Medicine

## 2014-12-03 NOTE — Telephone Encounter (Signed)
Pt calling and states that her BP is running too high and would like to speak to the provider about this considering that she can't get in to see the provider until 12/30/14. Pt reports that she has just recently started lisinopril. Please contact pt at the earliest convenience. Thank you, Fonda Kinder, ASA

## 2014-12-03 NOTE — Patient Outreach (Signed)
Call received from member.  She apologizes for missing home visit last week, stating that she have been "sick, really sick mentally."  She reports that she has been receiving help as an outpatient at behavioral health and feel she is doing much better.  She states that she does not have any health concerns at this time other than she is in need of another refill for Spiriva and does not know what pharmacy to call.  Instructed to look at the label on the box to find out what pharmacy she will need to call for the refill.  She also reports that she has been placed on new medications for her blood pressure and would like to review them with this care manager.  Routine home visit rescheduled.  Encouraged to contact this care manager with any further concerns.  [name], BSN, [place] Management  [facility] Care Manager [phone]

## 2014-12-04 ENCOUNTER — Other Ambulatory Visit: Payer: Self-pay | Admitting: *Deleted

## 2014-12-04 MED ORDER — LISINOPRIL 10 MG PO TABS: 10.0000 mg | ORAL_TABLET | Freq: Every day | ORAL | Status: DC

## 2014-12-04 MED ORDER — OMEPRAZOLE 40 MG PO CPDR: 40.0000 mg | DELAYED_RELEASE_CAPSULE | Freq: Every day | ORAL | Status: DC

## 2014-12-04 NOTE — Telephone Encounter (Signed)
Patient is requesting a 90 day supply.   Tiffany [name],CMA

## 2014-12-04 NOTE — Telephone Encounter (Signed)
At our last appt (over 6 weeks ago) the patient was instructed to follow up with me in 2 weeks as we madesignificant changes to her HTN regimen. Please have her get in with another provider as soon as possible and to bring in ALL the medications she's been taking. She should NOT wait to see me in December.  Thanks, Archie Patten, MD [facility] Family Medicine Resident  12/04/2014, 11:14 AM

## 2014-12-04 NOTE — Telephone Encounter (Signed)
Patient informed of message from MD, appointment scheduled with Dr. Ardelia Mems for 11/21.

## 2014-12-04 NOTE — Telephone Encounter (Signed)
Refilled Rx x 1 month. Please let the patient know she needs to come in for a f/u appt with me or another provider in ASAP.  Thanks,  Archie Patten, MD [facility] Family Medicine Resident  12/04/2014, 11:12 AM

## 2014-12-07 ENCOUNTER — Ambulatory Visit (INDEPENDENT_AMBULATORY_CARE_PROVIDER_SITE_OTHER): Payer: Commercial Managed Care - HMO | Admitting: Family Medicine

## 2014-12-07 ENCOUNTER — Other Ambulatory Visit: Payer: Self-pay | Admitting: Pharmacist

## 2014-12-07 ENCOUNTER — Encounter: Payer: Self-pay | Admitting: Family Medicine

## 2014-12-07 VITALS — BP 155/85 | HR 78 | Temp 98.8°F | Ht 64.0 in | Wt 269.6 lb

## 2014-12-07 DIAGNOSIS — I10 Essential (primary) hypertension: Secondary | ICD-10-CM

## 2014-12-07 LAB — BASIC METABOLIC PANEL
BUN: 13 mg/dL (ref 7–25)
CO2: 31 mmol/L (ref 20–31)
Calcium: 9.6 mg/dL (ref 8.6–10.4)
Chloride: 105 mmol/L (ref 98–110)
Creat: 0.98 mg/dL (ref 0.50–1.05)
Glucose, Bld: 79 mg/dL (ref 65–99)
Potassium: 4.7 mmol/L (ref 3.5–5.3)
Sodium: 145 mmol/L (ref 135–146)

## 2014-12-07 MED ORDER — LISINOPRIL 20 MG PO TABS: 20.0000 mg | ORAL_TABLET | Freq: Every day | ORAL | Status: DC

## 2014-12-07 NOTE — Patient Instructions (Addendum)
Plan to increase lisinopril to 20mg  daily - sent in new prescription Checking labwork today. I will call you if for some reason we cannot do that new dose due to your labwork Return in 2 weeks for nurse visit to recheck your BP and recheck labwork again.   Be well, Dr. Ardelia Mems

## 2014-12-07 NOTE — Patient Outreach (Signed)
[facility]) Care Management  [place]   12/07/2014  YER GARINGER 02-07-1960 [id number]  Subjective:  Tiffany [name] is a 54 y.o. female followed by pharmacy for medication assistance. Patient was approved for full Medicare Extra Help. No further pharmacy assistance is needed at this time. Will close out of the pharmacy program.   Nicoletta Ba, PharmD, [place] [phone]

## 2014-12-07 NOTE — Progress Notes (Signed)
Date of Visit: 12/07/2014   HPI:  Pt presents for a same day appointment to discuss elevated blood pressures.  Recently was seen on 10/4 by PCP Dr. Lorenso Courier. BP noted low at that visit with question of compliance, so clonidine was tapered and lisinopril started. Patient reports that since these changes were made, BP's have been elevated. Got [id number] systolic reading yesterday. Took 3tsp of vinegar in order to get her BP down. Presently taking verapamil and lisinopril. Denies any chest pain or unusual shortness of breath (reports history of asthma). Eating and drinking well. Urinating normally.   ROS: See HPI  Rhame: history of asthma, bipolar disorder, COPD, depression/anxiety, diabetes, hyperlipidemia, HSV, hypertension, obesity, OSA on CPAP, seizure  PHYSICAL EXAM: BP 155/85 mmHg  Pulse 78  Temp(Src) 98.8 F (37.1 C) (Oral)  Ht 5\' 4"  (1.626 m)  Wt 269 lb 9.6 oz (122.29 kg)  BMI 46.25 kg/m2 Gen: NAD, pleasant, cooperative HEENT: normocephalic, atraumatic, moist mucous membranes  Heart: regular rate and rhythm, no murmur Lungs: clear to auscultation bilaterally, normal work of breathing  Abdomen: obese Neuro: grossly nonfocal, speech normal  ASSESSMENT/PLAN:  HTN (hypertension) Uncontrolled. Check BMET today as patient never followed up for BMET after initiation of lisinopril last month. Will increase lisinopril to 20mg  daily. Follow up in 2 weeks for nurse BP check & BMET.    FOLLOW UP: F/u in 2 weeks for RN BP check & lab visit  [place] J. Ardelia Mems, [place]

## 2014-12-08 MED ORDER — OMEPRAZOLE 40 MG PO CPDR: 40.0000 mg | DELAYED_RELEASE_CAPSULE | Freq: Every day | ORAL | Status: DC

## 2014-12-08 MED ORDER — LISINOPRIL 10 MG PO TABS: 10.0000 mg | ORAL_TABLET | Freq: Every day | ORAL | Status: DC

## 2014-12-08 NOTE — Telephone Encounter (Signed)
Resending refill request to [facility] for omeprazole 40 mg and lisinopril 10 mg.  They were approved for refill on 12/04/14.  Derl Barrow, RN

## 2014-12-08 NOTE — Assessment & Plan Note (Signed)
Uncontrolled. Check BMET today as patient never followed up for BMET after initiation of lisinopril last month. Will increase lisinopril to 20mg  daily. Follow up in 2 weeks for nurse BP check & BMET.

## 2014-12-08 NOTE — Addendum Note (Signed)
Addended by: Derl Barrow on: 12/08/2014 08:46 AM   Modules accepted: Orders

## 2014-12-14 ENCOUNTER — Other Ambulatory Visit: Payer: Self-pay | Admitting: Family Medicine

## 2014-12-14 ENCOUNTER — Ambulatory Visit (HOSPITAL_COMMUNITY): Payer: Self-pay | Admitting: Clinical

## 2014-12-16 ENCOUNTER — Other Ambulatory Visit: Payer: Self-pay | Admitting: *Deleted

## 2014-12-16 ENCOUNTER — Telehealth: Payer: Self-pay | Admitting: Family Medicine

## 2014-12-16 NOTE — Telephone Encounter (Signed)
Spoke with Brayton Layman [facility] nurse) who was doing a home visit with patient. BP still elevated (see thn note) and patient also complaining of congestion with productive cough. SDA appointment scheduled for tomorrow.

## 2014-12-16 NOTE — Telephone Encounter (Signed)
Bp continues to run high like 199/173.  Does she need to see the doctor again?

## 2014-12-16 NOTE — Patient Outreach (Signed)
[facility]) Care Management   12/16/2014  LATANIA BASCOMB 1960-01-31 [id number]  Tiffany [name] is an 54 y.o. female  Subjective:   Member reports that she has been doing "ok."  She states that she has been having a problem controlling her blood pressure, stating that for the past couple weeks it has been greater than 101 systolic.  She also reports that she has been having shortness of breath, some congestion, and a productive cough.  She reports still smoking cigarettes, "I smoke more now that I did before.  I just have so much going on.  I go talk to my counselor on Monday and I hope she can help me figure some things out."  Objective:   Review of Systems  Constitutional: Negative.   HENT: Positive for congestion.   Eyes: Negative.   Respiratory: Positive for cough and sputum production.        Gray/tan mucus   Cardiovascular: Negative.   Gastrointestinal: Negative.   Genitourinary: Negative.   Musculoskeletal: Negative.   Skin: Negative.   Neurological: Negative.   Endo/Heme/Allergies: Negative.   Psychiatric/Behavioral: Positive for depression.    Physical Exam  Constitutional: [name] oriented to person, place, and time. She appears well-developed and well-nourished.  Neck: Normal range of motion.  Cardiovascular: Normal rate, regular rhythm and normal heart sounds.   Respiratory: Breath sounds normal.  GI: Soft. Bowel sounds are normal.  Musculoskeletal: Normal range of motion.  Neurological: [name] alert and oriented to person, place, and time.  Skin: Skin is warm and dry.   BP 174/108 mmHg  Pulse 71  Resp 24  SpO2 93%  Current Medications:   Current Outpatient Prescriptions  Medication Sig Dispense Refill  . ACCU-CHEK SOFTCLIX LANCETS lancets     . albuterol (PROVENTIL HFA;VENTOLIN HFA) 108 (90 BASE) MCG/ACT inhaler Inhale 2 puffs into the lungs every 6 (six) hours as needed. 1 Inhaler 6  . albuterol (PROVENTIL) (2.5 MG/3ML) 0.083%  nebulizer solution Take 3 mLs (2.5 mg total) by nebulization every 6 (six) hours as needed for wheezing or shortness of breath. 150 mL 1  . aspirin 81 MG EC tablet TAKE 1 TABLET (81 MG TOTAL) BY MOUTH DAILY. 30 tablet 0  . atorvastatin (LIPITOR) 40 MG tablet Take 1 tablet (40 mg total) by mouth daily. 90 tablet 4  . budesonide-formoterol (SYMBICORT) 160-4.5 MCG/ACT inhaler Inhale 2 puffs into the lungs 2 (two) times daily. 1 Inhaler 6  . diphenhydrAMINE (BENADRYL) 25 mg capsule Take 50 mg by mouth every 6 (six) hours as needed.    [name] escitalopram (LEXAPRO) 20 MG tablet Take 1 and 1/2 tab daily 45 tablet 2  . furosemide (LASIX) 40 MG tablet Take 1 tablet (40 mg total) by mouth daily. 90 tablet 3  . gabapentin (NEURONTIN) 600 MG tablet Take 1 tablet (600 mg total) by mouth 3 (three) times daily. 270 tablet 4  . lisinopril (PRINIVIL,ZESTRIL) 20 MG tablet Take 1 tablet (20 mg total) by mouth daily. 90 tablet 0  . metFORMIN (GLUCOPHAGE-XR) 500 MG 24 hr tablet TAKE 2 TABLETS EVERY DAY  WITH  BREAKFAST 180 tablet 3  . mirtazapine (REMERON) 30 MG tablet Take 30 mg by mouth at bedtime.    . montelukast (SINGULAIR) 10 MG tablet Take 1 tablet (10 mg total) by mouth at bedtime. 90 tablet 3  . Multiple Vitamins-Minerals (MULTIVITAMIN WITH MINERALS) tablet Take 1 tablet by mouth daily.    . naproxen (NAPROSYN) 500 MG tablet Take  1 tablet (500 mg total) by mouth 3 (three) times daily as needed. 270 tablet 3  . omeprazole (PRILOSEC) 40 MG capsule Take 1 capsule (40 mg total) by mouth daily. 90 capsule 1  . oxybutynin (DITROPAN) 5 MG tablet Take 1 tablet (5 mg total) by mouth 3 (three) times daily. 270 tablet 3  . oxymetazoline (AFRIN) 0.05 % nasal spray Place 1 spray into both nostrils 2 (two) times daily.    . potassium chloride (K-DUR) 10 MEQ tablet Take 2 tablets (20 mEq total) by mouth daily. 180 tablet 2  . tiotropium (SPIRIVA HANDIHALER) 18 MCG inhalation capsule Place 1 capsule (18 mcg total) into inhaler  and inhale daily. 90 capsule 4  . verapamil (VERELAN PM) 360 MG 24 hr capsule Take 1 capsule (360 mg total) by mouth at bedtime. 90 capsule 0  . ziprasidone (GEODON) 80 MG capsule Take 1 capsule (80 mg total) by mouth 2 (two) times daily with a meal. 60 capsule 2  . amitriptyline (ELAVIL) 25 MG tablet Take 1 tablet (25 mg total) by mouth at bedtime. (Patient not taking: Reported on 12/16/2014) 30 tablet 0  . cloNIDine (CATAPRES) 0.1 MG tablet Take 1 tablet (0.1 mg total) by mouth 2 (two) times daily. (Patient not taking: Reported on 12/16/2014) 60 tablet 0  . fluticasone (FLONASE) 50 MCG/ACT nasal spray Place 2 sprays into both nostrils daily. (Patient not taking: Reported on 12/16/2014) 16 g 3  . lisinopril (PRINIVIL,ZESTRIL) 10 MG tablet Take 1 tablet (10 mg total) by mouth daily. (Patient not taking: Reported on 12/16/2014) 90 tablet 0  . nicotine (NICODERM CQ - DOSED IN MG/24 HOURS) 21 mg/24hr patch Place 1 patch (21 mg total) onto the skin daily. (Patient not taking: Reported on 10/09/2014) 28 patch 0   No current facility-administered medications for this visit.    Functional Status:   In your present state of health, do you have any difficulty performing the following activities: 10/09/2014 07/31/2014  Hearing? Y N  Vision? Y Y  Difficulty concentrating or making decisions? Y N  Walking or climbing stairs? Y Y  Dressing or bathing? Y Y  Doing errands, shopping? Y N  Preparing Food and eating ? N N  Using the Toilet? N N  In the past six months, have you accidently leaked urine? N N  Do you have problems with loss of bowel control? N N  Managing your Medications? N N  Managing your Finances? Y N  Housekeeping or managing your Housekeeping? N Y    Fall/Depression Screening:    PHQ 2/9 Scores 12/07/2014 10/20/2014 09/24/2014 08/17/2014 07/22/2014 06/26/2014 05/21/2014  PHQ - 2 Score 0 0 3 2 0 0 0  PHQ- 9 Score - - 16 4 - - -    Assessment:    Member reports checking blood pressure daily,  but does not record her readings.  She states that her Lisinopril was increased, however she continues to have elevated readings.  Instructed to record blood pressure readings to be able to discuss trends with physician.  Call placed to PCP office to inform them of elevated pressure and member's complaints of shortness of breath and congestion.  Able to secure appointment for tomorrow morning.  She reports taking all medications as prescribed, denying any further health concerns at this time.  She does express concern over some personal issues, stating that her housing was a major concern.  [name] still looking to move from her mother's home, but has been unable to  do so.  Made aware that this care manager would obtain a list of affordable housing for member.  She states that other concerns will be addressed with her counselor.   Member reports that she has continued to smoke but states that it is due to her social/mental concerns.  She reports having Nicotine patches, but state that they have not worked for her.  [name] encouraged to discuss alternative options with physician during visit.  She denies any further concerns at this time.  Encouraged to contact this care manager with any questions.  Plan:   Routine home visit scheduled for next month. Will obtain list of affordable housing from Education officer, museum.  THN CM Care Plan Problem One        Most Recent Value   Care Plan Problem One  Hypertension   Role Documenting the Problem One  Care Management Coordinator   Care Plan for Problem One  Active   THN Long Term Goal (31-90 days)  Member's blood pressure will consistently remain less than target range for the next 45 days   THN Long Term Goal Start Date  09/15/14 [Goal not met, date reset]   Interventions for Problem One Long Term Goal  Discussed with member the importance of maintaining a blood pressure within target range, taking medications, and monitoring blood pressure in order to accurately  manage hypertension   THN CM Short Term Goal #1 (0-30 days)  Member will pick up Verapamil from pharmacy within the next week   THN CM Short Term Goal #1 Start Date  09/22/14   [facility] CM Short Term Goal #1 Met Date  12/16/14   Interventions for Short Term Goal #1  Discussed with member the importance of maintaining a blood pressure within target range, taking medications, and monitoring blood pressure in order to accurately manage hypertension   THN CM Short Term Goal #2 (0-30 days)  Member will take all medications as prescribed within the next 4 weeks   THN CM Short Term Goal #2 Start Date  09/22/14   [facility] CM Short Term Goal #2 Met Date  12/16/14   Interventions for Short Term Goal #2  Discussed with member the importance of maintaining a blood pressure within target range, taking medications, and monitoring blood pressure in order to accurately manage hypertension   THN CM Short Term Goal #3 (0-30 days)  Member will monitor and record blood pressure on a daily basis over the next 4 weeks   THN CM Short Term Goal #3 Start Date  09/15/14 [Goal date reset]   Interventions for Short Tern Goal #3  Discussed with member the importance of maintaining a blood pressure within target range, taking medications, and monitoring blood pressure in order to accurately manage hypertension    THN CM Care Plan Problem Two        Most Recent Value   Care Plan Problem Two  Overweight   Role Documenting the Problem Two  Care Management Coordinator   Care Plan for Problem Two  Not Active   Interventions for Problem Two Long Term Goal   Discussed the importance that weight has on managing conditions (hypertension, asthma, COPD, and diabetes).     THN Long Term Goal (31-90) days  Member will lose 20-30 pounds within the next 90 days   THN Long Term Goal Start Date  08/17/14 [Goal not met]   THN CM Short Term Goal #1 (0-30 days)  Member will monitor weights at least 3 times a  week for the next 4 weeks   THN CM Short Term  Goal #1 Start Date  09/22/14 [goal not met, date reset]   THN CM Short Term Goal #1 Met Date   -- [goal not met]   Interventions for Short Term Goal #2   Discussed the importance of weights to monitor progress   THN CM Short Term Goal #2 (0-30 days)  Member will adjust diet to diabetic and low sodium, including portion control, over the next 4 weeks   THN CM Short Term Goal #2 Start Date  09/22/14 Barrie Folk not met, date reset]   [facility] CM Short Term Goal #2 Met Date  -- [goal not met]     [name], BSN, [place] Manager [phone]

## 2014-12-17 ENCOUNTER — Ambulatory Visit (INDEPENDENT_AMBULATORY_CARE_PROVIDER_SITE_OTHER): Payer: Commercial Managed Care - HMO | Admitting: Student

## 2014-12-17 ENCOUNTER — Encounter: Payer: Self-pay | Admitting: Student

## 2014-12-17 ENCOUNTER — Telehealth (HOSPITAL_COMMUNITY): Payer: Self-pay

## 2014-12-17 VITALS — BP 162/98 | HR 74 | Temp 98.3°F | Ht 64.0 in | Wt 265.5 lb

## 2014-12-17 DIAGNOSIS — R0981 Nasal congestion: Secondary | ICD-10-CM | POA: Insufficient documentation

## 2014-12-17 DIAGNOSIS — I10 Essential (primary) hypertension: Secondary | ICD-10-CM

## 2014-12-17 MED ORDER — LISINOPRIL 40 MG PO TABS: 40.0000 mg | ORAL_TABLET | Freq: Every day | ORAL | Status: DC

## 2014-12-17 NOTE — Progress Notes (Addendum)
   Subjective:    Patient ID: Tiffany [name], female    DOB: [birth year], 54 y.o.   MRN: [id number]  HPI Hypertension: BP 178/100 here. Repeat BP 162/98. Checks her BP at home. Says systolic in [id number] and diastolic in [id number]. Feels "pulling in her chest". She describes it as tightness. She has this for a week. No change over the course of the week. Denies pain in her arm or jaw. Denies palpitation. Reports lightheadedness. This is also for about a week. Denies diaphoresis. Shortness of breath at baseline. Occasional headache. Denies neurologic changes.  She was here last week. At that visit her lisinopril was increased from 10 to 20mg . She has been taking this and her other medications deligently. She used to be on clonidine that has stopped due to fluctuating BP. She uses afrin for congestion which could contribute to her hypertension as well. She says that it the only medication that helped with congestion.  Congestion: for a week. Has runny nose. [name] also coughing up light phlegm with black stuff in it. She smokes cigarettes. Denies fever, chills & night sweat. No sore throat.  [name] using afrin now, which is helping. Tried Flonase in the past. Says it didn't work for her.  Review of Systems Per HPI    Objective:   Physical Exam  Filed Vitals:   12/17/14 1421 12/17/14 1521  BP: 178/100 162/98  Pulse: 74   Temp: 98.3 F (36.8 C)   TempSrc: Oral   Height: 5\' 4"  (1.626 m)   Weight: 265 lb 8 oz (120.43 kg)    Gen: well-appearing, obese Nares:swelling and erythema of the turbinates, congestion Oropharynx: clear, moist CV: RRR. S1 & S2 audible Resp: no apparent WOB, CTAB.    Assessment & Plan:  HTN (hypertension) Uncontrolled. Possible secondary causes are OSA. She uses CPAP at home. Renal stenosis is possibility but her creatinine is within normal limit. Reports compliance with her medications.  -Increased her lisinopril to 40 mg daily.  -Encourage to stop using afrin. However, she  is against this. She reports trying saline spray &  flonase which didn't help. She already on benadryl to consider antihistamine although this is not good for her. -Follow up in two weeks  -Ordered future lab for Bmet when she comes back in two weeks for follow up on her blood pressure  Nasal congestion Likely rhinitis medicamentosa. [name] taking afrin. She says this is the only medication helping. I have explained to her about the rebound congestion and its effect on blood pressure.  -encouraged her to stop and try nasal saline spray and flonase again.

## 2014-12-17 NOTE — Patient Instructions (Signed)
It was great seeing you today! We have addressed the following issues today  1. Blood pressure: your blood pressure is still high. I have increased your lisinopril to 40 mg daily. [facility]. I would like you to come back in two weeks for blood pressure check up and labs 2. Congestion: afrin may cause congestion when you stop taking it. I recommend using saline spray if possible.    If we did any lab work today, and the results require attention, either me or my nurse will get in touch with you. If everything is normal, you will get a letter in mail. If you don't hear from [name] in two weeks, please give [name] a call. Otherwise, I look forward to talking with you again at our next visit. If you have any questions or concerns before then, please call the clinic at [phone].  Please bring all your medications to every doctors visit   Sign up for My Chart to have easy access to your labs results, and communication with your Primary care physician.    Please check-out at the front desk before leaving the clinic.   Take Care,

## 2014-12-17 NOTE — Telephone Encounter (Signed)
Telephone call with patient to follow up on message she left she was in need of a new Lexapro order.  Informed a [facility] [address] [facility] and patient stated the pharmacy was reporting they did not have any orders.  Called and spoke with Lattie Haw, [facility] [address] who confirmed they had the order from 10/30/14 and it went through and was being filled for patient to pick up.  Called patient back to inform [facility] had [facility] and were now [facility] order. Patient to call back if any further concerns.

## 2014-12-17 NOTE — Assessment & Plan Note (Signed)
Uncontrolled. Possible secondary causes are OSA. She uses CPAP at home. Renal stenosis is possibility but her creatinine is within normal limit. Reports compliance with her medications.  -Increased her lisinopril to 40 mg daily.  -Encourage to stop using afrin. However, she is against this. She reports trying saline spray &  flonase which didn't help. She already on benadryl to consider antihistamine although this is not good for her. -Follow up in two weeks  -Ordered future lab for Bmet when she comes back in two weeks for follow up on her blood pressure

## 2014-12-17 NOTE — Assessment & Plan Note (Signed)
Likely rhinitis medicamentosa. [name] taking afrin. She says this is the only medication helping. I have explained to her about the rebound congestion and its effect on blood pressure.  -encouraged her to stop and try nasal saline spray and flonase again.

## 2014-12-21 ENCOUNTER — Ambulatory Visit (INDEPENDENT_AMBULATORY_CARE_PROVIDER_SITE_OTHER): Payer: Medicare HMO | Admitting: Clinical

## 2014-12-21 ENCOUNTER — Ambulatory Visit (HOSPITAL_COMMUNITY): Payer: Self-pay | Admitting: Clinical

## 2014-12-21 ENCOUNTER — Encounter (HOSPITAL_COMMUNITY): Payer: Self-pay | Admitting: Clinical

## 2014-12-21 DIAGNOSIS — F250 Schizoaffective disorder, bipolar type: Secondary | ICD-10-CM | POA: Diagnosis not present

## 2014-12-21 NOTE — Progress Notes (Signed)
   THERAPIST PROGRESS NOTE  Session Time: 3:55 - 4:25  Participation Level: Active  Behavioral Response: CasualAlertDepressed  Type of Therapy: Individual Therapy  Treatment Goals addressed: improve psychiatric symptoms, Improve unhelpful thought patterns,  Mood stabilization, interpersonal relationship skills, stress management  Interventions: CBT and Motivational Interviewing, Grounding and Mindfulness Techniques  Summary: Tiffany [name] is a 54 y.o. female who presents with schizoaffective disorder, bipolar type  Suicidal/Homicidal: No -without intent/plan  Therapist Response: Tiffany [name] met with clinician for an individual session. Tiffany [name] discussed her psychiatric symptoms and her current life. She shared that she was having problems with her mother. Tiffany [name] and her son live with her mother and her mother is currently not speaking to her. Clinician asked Tiffany [name] to identify some solutions for improving her relationship with her mother. Tiffany [name] shared that the best thing she could do was to let her cool offShe shared that she had pneumonia for the last 3 weeks and has been feeling down. She stated that she was feeling down and that probably her recent illness had increased her feeling of depression. She shared that she has been trying to save money so her and her son can move out of the house but she has been unsuccessful. Tiffany [name] shared some of the challenges of setting healthy boundaries with her son. They both agreed that they would like to move out but she is likely to give in to her son's desires for things. She has lot him and he is using his Christmas presents already. She shared that his birthday is [birth year] and that he'll be turning 18. She shared that she loves her son but there is stress related to his mental illness.  Tiffany [name] clinician discussed some stress management techniques. [name] reported that she doesn't have a good memory and will use the techniques for a while but then will forget to use  them.       Plan: Return again in 2-3 weeks.  Diagnosis: Axis I:   Schizoaffective disorder, bipolar type    Chick Cousins A, LCSW 12/21/2014

## 2014-12-30 ENCOUNTER — Ambulatory Visit: Payer: Commercial Managed Care - HMO | Admitting: Family Medicine

## 2015-01-12 ENCOUNTER — Other Ambulatory Visit: Payer: Self-pay | Admitting: *Deleted

## 2015-01-12 NOTE — Patient Outreach (Signed)
[facility]) Care Management   01/12/2015  Tiffany [name] August 11, 1960 [id number]  Tiffany [name] is an 54 y.o. female  Subjective:   Member states that she is "doing ok.  Still can't get this breathing right."  She reports that she was advised to stop using Afrin, but has not because "it works."  She still has some nasal congestion, but states that it has gotten better over the past couple weeks.  Objective:   Review of Systems  Constitutional: Negative.   HENT: Negative.   Eyes: Negative.   Respiratory: Positive for shortness of breath and wheezing.        With activity   Cardiovascular: Negative.   Gastrointestinal: Negative.   Genitourinary: Negative.   Musculoskeletal: Negative.   Skin: Negative.   Neurological: Negative.   Endo/Heme/Allergies: Negative.   Psychiatric/Behavioral: Negative.     Physical Exam  Constitutional: [name] oriented to person, place, and time. She appears well-developed and well-nourished.  Neck: Normal range of motion.  Cardiovascular: Normal rate, regular rhythm and normal heart sounds.   Respiratory: Effort normal. She has wheezes.  Right side   GI: Soft. Bowel sounds are normal.  Musculoskeletal: Normal range of motion.  Neurological: [name] alert and oriented to person, place, and time.  Skin: Skin is warm and dry.   BP 140/98 mmHg  Pulse 64  Resp 22  SpO2 95%  Current Medications:   Current Outpatient Prescriptions  Medication Sig Dispense Refill  . ACCU-CHEK SOFTCLIX LANCETS lancets     . albuterol (PROVENTIL HFA;VENTOLIN HFA) 108 (90 BASE) MCG/ACT inhaler Inhale 2 puffs into the lungs every 6 (six) hours as needed. 1 Inhaler 6  . aspirin 81 MG EC tablet TAKE 1 TABLET (81 MG TOTAL) BY MOUTH DAILY. 30 tablet 0  . atorvastatin (LIPITOR) 40 MG tablet Take 1 tablet (40 mg total) by mouth daily. 90 tablet 4  . budesonide-formoterol (SYMBICORT) 160-4.5 MCG/ACT inhaler Inhale 2 puffs into the lungs 2 (two) times  daily. 1 Inhaler 6  . diphenhydrAMINE (BENADRYL) 25 mg capsule Take 50 mg by mouth every 6 (six) hours as needed.    [name] escitalopram (LEXAPRO) 20 MG tablet Take 1 and 1/2 tab daily 45 tablet 2  . furosemide (LASIX) 40 MG tablet Take 1 tablet (40 mg total) by mouth daily. 90 tablet 3  . gabapentin (NEURONTIN) 600 MG tablet Take 1 tablet (600 mg total) by mouth 3 (three) times daily. 270 tablet 4  . lisinopril (PRINIVIL,ZESTRIL) 40 MG tablet Take 1 tablet (40 mg total) by mouth daily. 90 tablet 3  . metFORMIN (GLUCOPHAGE-XR) 500 MG 24 hr tablet TAKE 2 TABLETS EVERY DAY  WITH  BREAKFAST 180 tablet 3  . mirtazapine (REMERON) 30 MG tablet Take 30 mg by mouth at bedtime.    . montelukast (SINGULAIR) 10 MG tablet Take 1 tablet (10 mg total) by mouth at bedtime. 90 tablet 3  . Multiple Vitamins-Minerals (MULTIVITAMIN WITH MINERALS) tablet Take 1 tablet by mouth daily.    . naproxen (NAPROSYN) 500 MG tablet Take 1 tablet (500 mg total) by mouth 3 (three) times daily as needed. 270 tablet 3  . omeprazole (PRILOSEC) 40 MG capsule Take 1 capsule (40 mg total) by mouth daily. 90 capsule 1  . oxybutynin (DITROPAN) 5 MG tablet Take 1 tablet (5 mg total) by mouth 3 (three) times daily. 270 tablet 3  . oxymetazoline (AFRIN) 0.05 % nasal spray Place 1 spray into both nostrils 2 (two) times  daily.    . potassium chloride (K-DUR) 10 MEQ tablet Take 2 tablets (20 mEq total) by mouth daily. 180 tablet 2  . tiotropium (SPIRIVA HANDIHALER) 18 MCG inhalation capsule Place 1 capsule (18 mcg total) into inhaler and inhale daily. 90 capsule 4  . verapamil (VERELAN PM) 360 MG 24 hr capsule Take 1 capsule (360 mg total) by mouth at bedtime. 90 capsule 0  . ziprasidone (GEODON) 80 MG capsule Take 1 capsule (80 mg total) by mouth 2 (two) times daily with a meal. 60 capsule 2  . albuterol (PROVENTIL) (2.5 MG/3ML) 0.083% nebulizer solution Take 3 mLs (2.5 mg total) by nebulization every 6 (six) hours as needed for wheezing or  shortness of breath. (Patient not taking: Reported on 01/12/2015) 150 mL 1  . amitriptyline (ELAVIL) 25 MG tablet Take 1 tablet (25 mg total) by mouth at bedtime. (Patient not taking: Reported on 12/16/2014) 30 tablet 0  . fluticasone (FLONASE) 50 MCG/ACT nasal spray Place 2 sprays into both nostrils daily. (Patient not taking: Reported on 12/16/2014) 16 g 3  . nicotine (NICODERM CQ - DOSED IN MG/24 HOURS) 21 mg/24hr patch Place 1 patch (21 mg total) onto the skin daily. (Patient not taking: Reported on 01/12/2015) 28 patch 0   No current facility-administered medications for this visit.    Functional Status:   In your present state of health, do you have any difficulty performing the following activities: 10/09/2014 07/31/2014  Hearing? Y N  Vision? Y Y  Difficulty concentrating or making decisions? Y N  Walking or climbing stairs? Y Y  Dressing or bathing? Y Y  Doing errands, shopping? Y N  Preparing Food and eating ? N N  Using the Toilet? N N  In the past six months, have you accidently leaked urine? N N  Do you have problems with loss of bowel control? N N  Managing your Medications? N N  Managing your Finances? Y N  Housekeeping or managing your Housekeeping? N Y    Fall/Depression Screening:    PHQ 2/9 Scores 12/17/2014 12/07/2014 10/20/2014 09/24/2014 08/17/2014 07/22/2014 06/26/2014  PHQ - 2 Score 3 0 0 3 2 0 0  PHQ- 9 Score - - - 16 4 - -    Assessment:    Member was seen at her PCP office for uncontrolled hypertension a few weeks ago, medication (Lisinopril) was increased and she was to follow up in two weeks.  She denies going to her follow up appointment because she didn't have the copay.  She reports that she has an appointment scheduled for next week, and states that she has been taking her medications as instructed.  She denies taking her blood pressure daily for trends, stating that the physician told her not to check it because "it would make my pressure go up."  Blood  pressure today is decreased from last home visit and last office visit.  Encouraged to check a couple times within the next week to have trends to reports back to physician during follow up visit.    Discussed with member her complaints of shortness of breath, particularly smoking status.  She denies calling the smoking cessation resource ([employer]) that was provided during the last home visit.  She continues to state that she will call to obtain nicotine patches and other cessation resources.  She also denies getting involved in any weight loss activities or exercise, and she still have not obtained her CPAP machine from storage.  Importance of all  of these things (smoking cessation, weight loss, and compliance with CPAP) on maintaining her health.  She verbalizes understanding and states that she is still currently working on problems with her mental health counselor and hope to have her circumstances better managed soon, leaving her time to focus more on her health.    Member has some faint wheezing on auscultation, states that she has been using her inhaler (not today), but has not used her nebulizer.  Instructed to use both her inhaler and nebulizer for shortness of breath and wheezing as indicated.  She denies any questions or needs at this time.  Encouraged to contact this care manager with any concerns.  Plan:   Routine home visit scheduled for next month.  THN CM Care Plan Problem One        Most Recent Value   Care Plan Problem One  Hypertension   Role Documenting the Problem One  Care Management Coordinator   Care Plan for Problem One  Active   THN Long Term Goal (31-90 days)  Member's blood pressure will consistently remain less than target range for the next 45 days   THN Long Term Goal Start Date  12/16/14 [Goal not met, date reset]   Interventions for Problem One Long Term Goal  Discussed with member the importance of maintaining a blood pressure within target range, taking  medications, and monitoring blood pressure in order to accurately manage hypertension   THN CM Short Term Goal #1 (0-30 days)  Member will pick up Verapamil from pharmacy within the next week   THN CM Short Term Goal #1 Start Date  09/22/14   [facility] CM Short Term Goal #1 Met Date  12/16/14   Interventions for Short Term Goal #1  Discussed with member the importance of maintaining a blood pressure within target range, taking medications, and monitoring blood pressure in order to accurately manage hypertension   THN CM Short Term Goal #2 (0-30 days)  Member will take all medications as prescribed within the next 4 weeks   THN CM Short Term Goal #2 Start Date  09/22/14   [facility] CM Short Term Goal #2 Met Date  12/16/14   Interventions for Short Term Goal #2  Discussed with member the importance of maintaining a blood pressure within target range, taking medications, and monitoring blood pressure in order to accurately manage hypertension   THN CM Short Term Goal #3 (0-30 days)  Member will monitor and record blood pressure on a daily basis over the next 4 weeks   THN CM Short Term Goal #3 Start Date  01/12/15 [Goal date reset]   Interventions for Short Tern Goal #3  Discussed with member the importance of maintaining a blood pressure within target range, taking medications, and monitoring blood pressure in order to accurately manage hypertension     [name], BSN, [place] Manager [phone]

## 2015-01-14 ENCOUNTER — Ambulatory Visit (INDEPENDENT_AMBULATORY_CARE_PROVIDER_SITE_OTHER): Payer: Medicare HMO | Admitting: Clinical

## 2015-01-14 ENCOUNTER — Encounter (HOSPITAL_COMMUNITY): Payer: Self-pay | Admitting: Clinical

## 2015-01-14 ENCOUNTER — Other Ambulatory Visit: Payer: Self-pay | Admitting: Family Medicine

## 2015-01-14 DIAGNOSIS — F250 Schizoaffective disorder, bipolar type: Secondary | ICD-10-CM

## 2015-01-14 NOTE — Progress Notes (Signed)
   THERAPIST PROGRESS NOTE  Session Time: 2:30 -3:30  Participation Level: Active  Behavioral Response: CasualAlertDepressed  Type of Therapy: Individual Therapy  Treatment Goals addressed: improve psychiatric symptoms, Improve unhelpful thought patterns,   stress management  Interventions: CBT and Motivational Interviewing, Grounding and Mindfulness Techniques  Summary: Tiffany [name] is a 54 y.o. female who presents with schizoaffective disorder, bipolar type  Suicidal/Homicidal: No -without intent/plan  Therapist Response: [name] met with clinician for an individual session. Suprina discussed her psychiatric symptoms and her current life events . She shared that she had recently relapsed ( a few weeks) on crack cocaine after 20 years of abstinence. Satsuki shared about her relapse and the events leading up to it. Terez shared that her behavior was not in alignment with who she likes to be.  Clinician asked if she would like a referral to a higher level of care or substance abuse treatment. Tashonna shared that she would like to try to turn it around on her own. Clinician asked about the likelihood of her attending NA. Aneira shared that NA increases her desire to use. Jaslin shared that she quit on her own last time. She shared that she could not remember what she had done to quit in the past. Jasmina identified triggers for use. She shared that using increases her psychiatric symptoms such as paranoia. [name] and clinician discussed the other cost of her use. [name] and clinician discussed the goals she was working on prior to use. [name] and clinician discussed her motivation for abstinence. Ival shared about some of the things that help her resist using. Client and clinician made a list of the the things she values and a list of the cost of use. The biggest motivator to stop using is her son. She shared she turned over the care of  Her money to her son which will help but does not completely prevent her using. Haillie  made a plan for abstinence including ways to use her stress management skills. [name] and clinician reviewed the numbers she could call if she had an emergency.  Plan: Return again in 2-3 weeks.  Diagnosis: Axis I:   Schizoaffective disorder, bipolar type   Izea Livolsi A, LCSW 01/14/2015

## 2015-01-17 ENCOUNTER — Encounter (HOSPITAL_COMMUNITY): Payer: Self-pay | Admitting: Clinical

## 2015-01-20 ENCOUNTER — Encounter: Payer: Self-pay | Admitting: Family Medicine

## 2015-01-20 ENCOUNTER — Ambulatory Visit (INDEPENDENT_AMBULATORY_CARE_PROVIDER_SITE_OTHER): Payer: Commercial Managed Care - HMO | Admitting: Family Medicine

## 2015-01-20 VITALS — BP 172/98 | HR 77 | Temp 98.0°F | Ht 64.0 in | Wt 259.6 lb

## 2015-01-20 DIAGNOSIS — I10 Essential (primary) hypertension: Secondary | ICD-10-CM

## 2015-01-20 DIAGNOSIS — E119 Type 2 diabetes mellitus without complications: Secondary | ICD-10-CM | POA: Diagnosis not present

## 2015-01-20 DIAGNOSIS — Z794 Long term (current) use of insulin: Secondary | ICD-10-CM

## 2015-01-20 DIAGNOSIS — B351 Tinea unguium: Secondary | ICD-10-CM

## 2015-01-20 LAB — COMPREHENSIVE METABOLIC PANEL
ALT: 16 U/L (ref 6–29)
AST: 17 U/L (ref 10–35)
Albumin: 4 g/dL (ref 3.6–5.1)
Alkaline Phosphatase: 136 U/L — ABNORMAL HIGH (ref 33–130)
BUN: 12 mg/dL (ref 7–25)
CO2: 28 mmol/L (ref 20–31)
Calcium: 9.8 mg/dL (ref 8.6–10.4)
Chloride: 104 mmol/L (ref 98–110)
Creat: 1.13 mg/dL — ABNORMAL HIGH (ref 0.50–1.05)
Glucose, Bld: 91 mg/dL (ref 65–99)
Potassium: 4 mmol/L (ref 3.5–5.3)
Sodium: 142 mmol/L (ref 135–146)
Total Bilirubin: 0.3 mg/dL (ref 0.2–1.2)
Total Protein: 6.7 g/dL (ref 6.1–8.1)

## 2015-01-20 MED ORDER — VERAPAMIL HCL ER 240 MG PO CP24: 480.0000 mg | ORAL_CAPSULE | Freq: Every day | ORAL | Status: DC

## 2015-01-20 MED ORDER — TERBINAFINE HCL 250 MG PO TABS: 250.0000 mg | ORAL_TABLET | Freq: Every day | ORAL | Status: DC

## 2015-01-20 NOTE — Progress Notes (Signed)
Patient ID: Tiffany [name], female   DOB: 04-01-1960, 55 y.o.   MRN: [id number]    Subjective: CC: f/u BP and toe concerns  HPI: Patient is a 55 y.o. female with a past medical history of HTN, COPD, asthma, DM, HLD, depression, bipolar 1, and seizures presenting to clinic today for f/u on HTN and great toe pain.  Toe pain:  Patient notes black discoloration on the great toe nails bilaterally that started for 8 months. She notes that is is sometimes uncomfortable because her toenails are also thick.  Tometimes her toes hurt when she puts pressure on them. Interestingly, she feels the toes sometimes feels worse when she walks around barefoot. Note some decreased sensation over the bunions but nothing else. She cuts her own toenails but notes sometimes it is difficult as they're so thick.   Hypertension Blood pressure at home: 150/90s Blood pressure today: 173/98 on repeat.  Meds: Compliant with lisinopril 40mg  daily and verapamil Side effects: None ROS: Denies headache, dizziness, visual changes, nausea, vomiting, chest pain, abdominal pain or shortness of breath.  Social History: current every day smoker  Health Maintenance: declines flu vaccine today  ROS: All other systems reviewed and are negative.  Past Medical History Patient Active Problem List   Diagnosis Date Noted  . Onychomycosis 01/21/2015  . Nasal congestion 12/17/2014  . Type 2 diabetes mellitus with complication ([place])   . Tobacco abuse 07/22/2014  . COPD with acute exacerbation ([place]) 07/08/2014  . COPD exacerbation ([place]) 06/26/2014  . Seborrheic keratoses 12/31/2013  . Thrush 09/19/2013  . Knee pain, bilateral 01/22/2013  . Seizure ([place]) 01/04/2013  . Health care maintenance 11/25/2012  . Folliculitis 0000000  . History of kidney stones 06/18/2012  . Chronic pain syndrome 06/18/2012  . Muscle spasm 06/18/2012  . Elevated serum creatinine 04/28/2012  . Asthma with acute exacerbation 04/28/2012  . Allergic  reaction 04/07/2012  . Bipolar 1 disorder ([place]) 12/08/2011  . Itching 09/06/2011  . HSV infection 08/30/2011  . Dyslipidemia 04/24/2011  . Anemia 04/24/2011  . Depression with anxiety 04/24/2011  . Diabetic neuropathy ([place]) 04/24/2011  . OSA on CPAP 10/18/2010  . Asthma 10/18/2010  . Obesity 10/18/2010  . HTN (hypertension) 10/18/2010  . Type 2 diabetes mellitus ([place]) 10/18/2010    Medications- reviewed and updated Current Outpatient Prescriptions  Medication Sig Dispense Refill  . ACCU-CHEK SOFTCLIX LANCETS lancets     . albuterol (PROVENTIL HFA;VENTOLIN HFA) 108 (90 BASE) MCG/ACT inhaler Inhale 2 puffs into the lungs every 6 (six) hours as needed. 1 Inhaler 6  . albuterol (PROVENTIL) (2.5 MG/3ML) 0.083% nebulizer solution Take 3 mLs (2.5 mg total) by nebulization every 6 (six) hours as needed for wheezing or shortness of breath. (Patient not taking: Reported on 01/12/2015) 150 mL 1  . amitriptyline (ELAVIL) 25 MG tablet Take 1 tablet (25 mg total) by mouth at bedtime. (Patient not taking: Reported on 12/16/2014) 30 tablet 0  . aspirin 81 MG EC tablet TAKE 1 TABLET (81 MG TOTAL) BY MOUTH DAILY. 30 tablet 0  . atorvastatin (LIPITOR) 40 MG tablet Take 1 tablet (40 mg total) by mouth daily. 90 tablet 4  . budesonide-formoterol (SYMBICORT) 160-4.5 MCG/ACT inhaler Inhale 2 puffs into the lungs 2 (two) times daily. 1 Inhaler 6  . diphenhydrAMINE (BENADRYL) 25 mg capsule Take 50 mg by mouth every 6 (six) hours as needed.    [name] escitalopram (LEXAPRO) 20 MG tablet Take 1 and 1/2 tab daily 45 tablet 2  . fluticasone (  FLONASE) 50 MCG/ACT nasal spray Place 2 sprays into both nostrils daily. (Patient not taking: Reported on 12/16/2014) 16 g 3  . furosemide (LASIX) 40 MG tablet Take 1 tablet (40 mg total) by mouth daily. 90 tablet 3  . gabapentin (NEURONTIN) 600 MG tablet Take 1 tablet (600 mg total) by mouth 3 (three) times daily. 270 tablet 4  . lisinopril (PRINIVIL,ZESTRIL) 40 MG tablet Take 1  tablet (40 mg total) by mouth daily. 90 tablet 3  . metFORMIN (GLUCOPHAGE-XR) 500 MG 24 hr tablet TAKE 2 TABLETS EVERY DAY  WITH  BREAKFAST 180 tablet 3  . mirtazapine (REMERON) 30 MG tablet Take 30 mg by mouth at bedtime.    . montelukast (SINGULAIR) 10 MG tablet Take 1 tablet (10 mg total) by mouth at bedtime. 90 tablet 3  . Multiple Vitamins-Minerals (MULTIVITAMIN WITH MINERALS) tablet Take 1 tablet by mouth daily.    . naproxen (NAPROSYN) 500 MG tablet Take 1 tablet (500 mg total) by mouth 3 (three) times daily as needed. 270 tablet 3  . nicotine (NICODERM CQ - DOSED IN MG/24 HOURS) 21 mg/24hr patch Place 1 patch (21 mg total) onto the skin daily. (Patient not taking: Reported on 01/12/2015) 28 patch 0  . omeprazole (PRILOSEC) 40 MG capsule Take 1 capsule (40 mg total) by mouth daily. 90 capsule 1  . oxybutynin (DITROPAN) 5 MG tablet Take 1 tablet (5 mg total) by mouth 3 (three) times daily. 270 tablet 3  . oxymetazoline (AFRIN) 0.05 % nasal spray Place 1 spray into both nostrils 2 (two) times daily.    . potassium chloride (K-DUR) 10 MEQ tablet Take 2 tablets (20 mEq total) by mouth daily. 180 tablet 2  . terbinafine (LAMISIL) 250 MG tablet Take 1 tablet (250 mg total) by mouth daily. 30 tablet 1  . tiotropium (SPIRIVA HANDIHALER) 18 MCG inhalation capsule Place 1 capsule (18 mcg total) into inhaler and inhale daily. 90 capsule 4  . verapamil (VERELAN PM) 240 MG 24 hr capsule Take 2 capsules (480 mg total) by mouth at bedtime. 60 capsule 0  . ziprasidone (GEODON) 80 MG capsule Take 1 capsule (80 mg total) by mouth 2 (two) times daily with a meal. 60 capsule 2   No current facility-administered medications for this visit.    Objective: Office vital signs reviewed. BP 172/98 mmHg  Pulse 77  Temp(Src) 98 F (36.7 C) (Oral)  Ht 5\' 4"  (1.626 m)  Wt 259 lb 9.6 oz (117.754 kg)  BMI 44.54 kg/m2   Physical Examination:  General: Awake, alert, well- nourished, NAD Cardio: RRR, no m/r/g  noted. No pitting edema noted.  Pulm: No increased WOB.  CTAB, without wheezes, rhonchi or crackles noted.  GI: soft, NT/ND,+BS x4, no hepatomegaly, no splenomegaly Feet:  Hard calloused feet with decreased sensation diffusely on monofilament exam. Great toes with thickened, brittle, yellow toe nails bilaterally. Also has a yellow hypertrophic right 2nd toe. Third toe nail with some blood from recent toe nail clipping per patient.    Assessment/Plan: Onychomycosis Patient with onychomycosis of the great toenails bilaterally. Given patient's history of diabetes, decreased sensation to monofilament test on exam, and the blood present from trying to clip her on toenails, will refer the patient to podiatry as I feel she's high risk in the future for injury/ulceration.  - Checked liver function tests - lamisil 250mg  daily for a 12 week course if she can tolerate  - will f/u with me in 6 weeks to f/u liver function  tests again to determine if treatment should continue. - referral to podiatry.  HTN (hypertension) Uncontrolled still. Was on clonidine 0.2mg  BID in the past but had spells of hypotension and syncope therefore this was discontinued. Interestingly, patient noted today that she has a h/o stroke and MI in the past approximately 20 years ago that is not documented in our system. Patient has pretty severe COPD and asthma therefore currently weighing risks and benefits of a non-selective beta blocker. - continue with lisinopril 40mg  daily - increase verapamil to 480mg  at bedtime - asked patient to f/u with Dr. Valentina Lucks in pharmacy clinic to further assist in managing her current regimen.     Orders Placed This Encounter  Procedures  . Comprehensive metabolic panel    Order Specific Question:  Has the patient fasted?    Answer:  No  . Ambulatory referral to Podiatry    Referral Priority:  Routine    Referral Type:  Consultation    Referral Reason:  Specialty Services Required    Requested  Specialty:  Podiatry    Number of Visits Requested:  1    Meds ordered this encounter  Medications  . DISCONTD: terbinafine (LAMISIL) 250 MG tablet    Sig: Take 1 tablet (250 mg total) by mouth daily.    Dispense:  30 tablet    Refill:  1  . verapamil (VERELAN PM) 240 MG 24 hr capsule    Sig: Take 2 capsules (480 mg total) by mouth at bedtime.    Dispense:  60 capsule    Refill:  0  . terbinafine (LAMISIL) 250 MG tablet    Sig: Take 1 tablet (250 mg total) by mouth daily.    Dispense:  30 tablet    Refill:  [facility] PGY-2, [place]

## 2015-01-20 NOTE — Patient Instructions (Signed)
I have increase your verapamil I have prescribed Lamisil for your toe nail fungus, I have also referred you to podiatry Please follow up in 6 weeks with Dr. Valentina Lucks

## 2015-01-21 ENCOUNTER — Other Ambulatory Visit: Payer: Self-pay | Admitting: *Deleted

## 2015-01-21 DIAGNOSIS — B351 Tinea unguium: Secondary | ICD-10-CM | POA: Insufficient documentation

## 2015-01-21 MED ORDER — GABAPENTIN 600 MG PO TABS: 600.0000 mg | ORAL_TABLET | Freq: Three times a day (TID) | ORAL | Status: DC

## 2015-01-21 NOTE — Assessment & Plan Note (Signed)
Uncontrolled still. Was on clonidine 0.2mg  BID in the past but had spells of hypotension and syncope therefore this was discontinued. Interestingly, patient noted today that she has a h/o stroke and MI in the past approximately 20 years ago that is not documented in our system. Patient has pretty severe COPD and asthma therefore currently weighing risks and benefits of a non-selective beta blocker. - continue with lisinopril 40mg  daily - increase verapamil to 480mg  at bedtime - asked patient to f/u with Dr. Valentina Lucks in pharmacy clinic to further assist in managing her current regimen.

## 2015-01-21 NOTE — Assessment & Plan Note (Signed)
Patient with onychomycosis of the great toenails bilaterally. Given patient's history of diabetes, decreased sensation to monofilament test on exam, and the blood present from trying to clip her on toenails, will refer the patient to podiatry as I feel she's high risk in the future for injury/ulceration.  - Checked liver function tests - lamisil 250mg  daily for a 12 week course if she can tolerate  - will f/u with me in 6 weeks to f/u liver function tests again to determine if treatment should continue. - referral to podiatry.

## 2015-01-27 ENCOUNTER — Ambulatory Visit (INDEPENDENT_AMBULATORY_CARE_PROVIDER_SITE_OTHER): Payer: Medicare HMO | Admitting: Psychiatry

## 2015-01-27 ENCOUNTER — Encounter (HOSPITAL_COMMUNITY): Payer: Self-pay | Admitting: Psychiatry

## 2015-01-27 VITALS — BP 144/86 | HR 81 | Ht 64.75 in | Wt 263.2 lb

## 2015-01-27 DIAGNOSIS — F419 Anxiety disorder, unspecified: Secondary | ICD-10-CM | POA: Diagnosis not present

## 2015-01-27 DIAGNOSIS — F200 Paranoid schizophrenia: Secondary | ICD-10-CM

## 2015-01-27 DIAGNOSIS — F3130 Bipolar disorder, current episode depressed, mild or moderate severity, unspecified: Secondary | ICD-10-CM | POA: Diagnosis not present

## 2015-01-27 MED ORDER — ZIPRASIDONE HCL 80 MG PO CAPS: 80.0000 mg | ORAL_CAPSULE | Freq: Two times a day (BID) | ORAL | Status: DC

## 2015-01-27 MED ORDER — ESCITALOPRAM OXALATE 20 MG PO TABS: ORAL_TABLET | ORAL | Status: DC

## 2015-01-27 MED ORDER — MIRTAZAPINE 15 MG PO TABS: 15.0000 mg | ORAL_TABLET | Freq: Every day | ORAL | Status: DC

## 2015-01-27 NOTE — Progress Notes (Signed)
Tiffany [name] [phone] Progress Note  SEQUOYA MOES [id number] 55 y.o.  01/27/2015 2:40 PM  Chief Complaint:  I feel sometime jitteriness and restlessness.  I think Remeron causing more anxiety.  I'm sleeping better.      History of Present Illness:  Tiffany [name] came for her follow-up appointment.  [name] complaining of jitteriness and restlessness some time.  She has no tremors or shakes but she feels anxious inside .  [name] reluctant to change the medication because she sleeping better.  [name] no longer taking amitriptyline.  [name] taking Lexapro 30 mg and Remeron 30 mg.  [name] also taking Geodon but is helping her paranoia.  She denies any hallucination, irritability, anger or any mood swing.  [name] seeing [name] for counseling.  She had a good Christmas.  She denies any feeling of hopelessness or worthlessness.  [name] seeing primary care physician for her primary needs and lately her blood pressure medicine was increased.  Patient denies any drinking or using any illegal substances.  Her vitals are stable.  She has no EPS or tremors.  She denies any major panic attack or any crying spells.  She denies any active or passive suicidal thoughts.  Suicidal Ideation: No Plan Formed: No Patient has means to carry out plan: No  Homicidal Ideation: No Plan Formed: No Patient has means to carry out plan: No  Past Psychiatric History/Hospitalization(s): Patient has nervous breakdown in 44 when her daughter died due to cancer.  She has seen multiple psychiatrist and admitted at least 3-4 times since then.  In the past she remembered taking Abilify, Risperdal but did not provide much detail.  As per chart she has taken Wellbutrin, Xanax in the past.  She was getting Klonopin 1 mg 3 times a day, BuSpar, Geodon 80 mg twice a day, Lexapro 30 mg daily and trazodone 300 mg from [place].  Patient denies any history of suicidal attempt but endorsed history of suicidal thoughts.  She endorse history of  paranoia, delusion, anxiety and manic-like symptoms.  She was seeing psychiatrist at [facility].  She was taking Anxiety: Yes Bipolar Disorder: Yes Depression: Yes Mania: Yes Psychosis: Yes Schizophrenia: Yes Personality Disorder: No Hospitalization for psychiatric illness: Yes History of Electroconvulsive Shock Therapy: No Prior Suicide Attempts: No  Medical History; Patient has high blood pressure, diabetes mellitus, hyperlipidemia, stroke, seasonal allergies, sleep apnea and questionable seizures.  Her primary care physician is [facility].   Review of Systems  Constitutional: Negative for weight loss and malaise/fatigue.  Cardiovascular: Negative for chest pain and palpitations.  Musculoskeletal: Negative.   Neurological: Negative for dizziness, tremors and headaches.  Psychiatric/Behavioral: Negative.     Psychiatric: Agitation: No Hallucination: No Depressed Mood: No Insomnia: No Hypersomnia: No Altered Concentration: No Feels Worthless: No Grandiose Ideas: No Belief In Special Powers: No New/Increased Substance Abuse: No Compulsions: No  Neurologic: Headache: No Seizure: Patient reported seizures but no clear documentation Paresthesias: No   Outpatient Encounter Prescriptions as of 01/27/2015  Medication Sig  . ACCU-CHEK SOFTCLIX LANCETS lancets   . albuterol (PROVENTIL HFA;VENTOLIN HFA) 108 (90 BASE) MCG/ACT inhaler Inhale 2 puffs into the lungs every 6 (six) hours as needed.  [name] albuterol (PROVENTIL) (2.5 MG/3ML) 0.083% nebulizer solution Take 3 mLs (2.5 mg total) by nebulization every 6 (six) hours as needed for wheezing or shortness of breath. (Patient not taking: Reported on 01/12/2015)  . aspirin 81 MG EC tablet TAKE 1 TABLET (81 MG TOTAL) BY  MOUTH DAILY.  [name] atorvastatin (LIPITOR) 40 MG tablet Take 1 tablet (40 mg total) by mouth daily.  . budesonide-formoterol (SYMBICORT) 160-4.5 MCG/ACT inhaler Inhale 2 puffs into the lungs 2 (two) times daily.  .  diphenhydrAMINE (BENADRYL) 25 mg capsule Take 50 mg by mouth every 6 (six) hours as needed.  [name] escitalopram (LEXAPRO) 20 MG tablet Take 1 and 1/2 tab daily  . fluticasone (FLONASE) 50 MCG/ACT nasal spray Place 2 sprays into both nostrils daily. (Patient not taking: Reported on 12/16/2014)  . furosemide (LASIX) 40 MG tablet Take 1 tablet (40 mg total) by mouth daily.  [name] gabapentin (NEURONTIN) 600 MG tablet Take 1 tablet (600 mg total) by mouth 3 (three) times daily.  [name] lisinopril (PRINIVIL,ZESTRIL) 40 MG tablet Take 1 tablet (40 mg total) by mouth daily.  . metFORMIN (GLUCOPHAGE-XR) 500 MG 24 hr tablet TAKE 2 TABLETS EVERY DAY  WITH  BREAKFAST  . mirtazapine (REMERON) 15 MG tablet Take 1 tablet (15 mg total) by mouth at bedtime.  . montelukast (SINGULAIR) 10 MG tablet Take 1 tablet (10 mg total) by mouth at bedtime.  . Multiple Vitamins-Minerals (MULTIVITAMIN WITH MINERALS) tablet Take 1 tablet by mouth daily.  . naproxen (NAPROSYN) 500 MG tablet Take 1 tablet (500 mg total) by mouth 3 (three) times daily as needed.  . nicotine (NICODERM CQ - DOSED IN MG/24 HOURS) 21 mg/24hr patch Place 1 patch (21 mg total) onto the skin daily. (Patient not taking: Reported on 01/12/2015)  . omeprazole (PRILOSEC) 40 MG capsule Take 1 capsule (40 mg total) by mouth daily.  [name] oxybutynin (DITROPAN) 5 MG tablet Take 1 tablet (5 mg total) by mouth 3 (three) times daily.  [name] oxymetazoline (AFRIN) 0.05 % nasal spray Place 1 spray into both nostrils 2 (two) times daily.  . potassium chloride (K-DUR) 10 MEQ tablet Take 2 tablets (20 mEq total) by mouth daily.  [name] terbinafine (LAMISIL) 250 MG tablet Take 1 tablet (250 mg total) by mouth daily.  [name] tiotropium (SPIRIVA HANDIHALER) 18 MCG inhalation capsule Place 1 capsule (18 mcg total) into inhaler and inhale daily.  . verapamil (VERELAN PM) 240 MG 24 hr capsule Take 2 capsules (480 mg total) by mouth at bedtime.  . ziprasidone (GEODON) 80 MG capsule Take 1 capsule (80 mg  total) by mouth 2 (two) times daily with a meal.  . [DISCONTINUED] amitriptyline (ELAVIL) 25 MG tablet Take 1 tablet (25 mg total) by mouth at bedtime. (Patient not taking: Reported on 12/16/2014)  . [DISCONTINUED] escitalopram (LEXAPRO) 20 MG tablet Take 1 and 1/2 tab daily  . [DISCONTINUED] escitalopram (LEXAPRO) 20 MG tablet Take 1 and 1/2 tab daily  . [DISCONTINUED] mirtazapine (REMERON) 15 MG tablet Take 1 tablet (15 mg total) by mouth at bedtime.  . [DISCONTINUED] mirtazapine (REMERON) 30 MG tablet Take 30 mg by mouth at bedtime.  . [DISCONTINUED] ziprasidone (GEODON) 80 MG capsule Take 1 capsule (80 mg total) by mouth 2 (two) times daily with a meal.  . [DISCONTINUED] ziprasidone (GEODON) 80 MG capsule Take 1 capsule (80 mg total) by mouth 2 (two) times daily with a meal.   No facility-administered encounter medications on file as of 01/27/2015.    Recent Results (from the past 2160 hour(s))  Basic metabolic panel     Status: None   Collection Time: 12/07/14 11:16 AM  Result Value Ref Range   Sodium 145 135 - 146 mmol/L   Potassium 4.7 3.5 - 5.3 mmol/L   Chloride 105 98 -  110 mmol/L   CO2 31 20 - 31 mmol/L   Glucose, Bld 79 65 - 99 mg/dL   BUN 13 7 - 25 mg/dL   Creat 0.98 0.50 - 1.05 mg/dL   Calcium 9.6 8.6 - 10.4 mg/dL  Comprehensive metabolic panel     Status: Abnormal   Collection Time: 01/20/15  4:11 PM  Result Value Ref Range   Sodium 142 135 - 146 mmol/L   Potassium 4.0 3.5 - 5.3 mmol/L   Chloride 104 98 - 110 mmol/L   CO2 28 20 - 31 mmol/L   Glucose, Bld 91 65 - 99 mg/dL   BUN 12 7 - 25 mg/dL   Creat 1.13 (H) 0.50 - 1.05 mg/dL   Total Bilirubin 0.3 0.2 - 1.2 mg/dL   Alkaline Phosphatase 136 (H) 33 - 130 U/L   AST 17 10 - 35 U/L   ALT 16 6 - 29 U/L   Total Protein 6.7 6.1 - 8.1 g/dL   Albumin 4.0 3.6 - 5.1 g/dL   Calcium 9.8 8.6 - 10.4 mg/dL      Constitutional:  BP 144/86 mmHg  Pulse 81  Ht 5' 4.75" (1.645 m)  Wt 263 lb 3.2 oz (119.387 kg)  BMI 44.12  kg/m2   Musculoskeletal: Strength & Muscle Tone: within normal limits Gait & Station: normal Patient leans: N/A  Psychiatric Specialty Exam: General Appearance: Casual and Obese  Eye Contact::  Fair  Speech:  Slow  Volume:  Normal  Mood:  Euthymic  Affect:  Appropriate  Thought Process:  Goal Directed  Orientation:  Full (Time, Place, and Person)  Thought Content:  WDL  Suicidal Thoughts:  No  Homicidal Thoughts:  No  Memory:  Immediate;   Fair Recent;   Poor Remote;   Poor  Judgement:  Fair  Insight:  Fair  Psychomotor Activity:  Normal  Concentration:  Fair  Recall:  [employer] of Knowledge:  Fair  Language:  Fair  Akathisia:  No  Handed:  Right  AIMS (if indicated):     Assets:  Desire for Improvement Financial Resources/Insurance Housing Social Support  ADL's:  Intact  Cognition:  WNL  Sleep:        Established Problem, Stable/Improving (1), Review or order clinical lab tests (1), Review of Last Therapy Session (1), Review of Medication Regimen & Side Effects (2) and Review of New Medication or Change in Dosage (2)  Assessment: Axis I: Paranoid schizophrenia chronic, bipolar disorder depressed type, anxiety disorder NOS  Axis II: Deferred  Axis III:  Past Medical History  Diagnosis Date  . High blood pressure   . Incontinence   . Anxiety   . Manic depression ([place])   . Heart attack ([place])   . Diabetes mellitus     class 2  . Asthma   . Emphysema   . High cholesterol   . Stroke ([place])   . Sinus trouble   . Seasonal allergies   . Sleep apnea   . Disorder of nervous system   . Myocardial infarct ([place])   . Complication of anesthesia     decreased bp, decreased heart rate     Plan:  I will discontinue amitriptyline since patient is no longer taking it.  Recommended to try low-dose Remeron .  Patient does not want to stop Remeron .  [name] also very reluctant to change Lexapro or Geodon.  Discuss serotonin syndrome however patient does not have  any signs at this time.  A new prescription of Remeron 15 mg for 90 days along with Lexapro 30 mg and Geodon 80 mg twice a day is given.  Encouraged to keep appointment with [name] for coping and social skills.  Recommended to call us back of symptoms started to get worse.  Follow-up in 3 months.Discuss safety plan that anytime having active suicidal thoughts or homicidal thoughts then patient need to call 911 or go to the local emergency room.   Tanzie Rothschild T., MD 01/27/2015

## 2015-01-28 ENCOUNTER — Telehealth (HOSPITAL_COMMUNITY): Payer: Self-pay

## 2015-01-28 NOTE — Telephone Encounter (Signed)
Met with Dr. Arfeen after call from patient's [facility] with concern patient taking Geodon and Lexapro together due to QT elongation.  Dr. Arfeen reported aware and has discussed with patient multiple times but she insists on staying on both medications and reports no problems.  Called Jack, pharmacist at patient's [facility] to verify okay to fill medication and then called patient to inform medications are being filled.  

## 2015-02-01 ENCOUNTER — Telehealth (HOSPITAL_COMMUNITY): Payer: Self-pay

## 2015-02-01 NOTE — Telephone Encounter (Signed)
Message left for patient about a letter received from [place] [employer] that patient does not need a prior authorization for Geodon as it is covered by her plan at listed covered amounts.  Informed patient to contact her pharmacy to make sure they are a [place] approved pharmacy or to go onto [email or web address] to find a pharmacy she can use for the medication.  Informed patient to call us back if medication too expensive with insurance or if problems getting filled.

## 2015-02-02 ENCOUNTER — Other Ambulatory Visit: Payer: Self-pay | Admitting: Family Medicine

## 2015-02-02 NOTE — Telephone Encounter (Signed)
Lisinopril refilled. Please have the patient follow up in our clinic within the next month.  Thanks, Archie Patten, MD [facility] Medicine Resident  02/02/2015, 5:04 PM

## 2015-02-04 ENCOUNTER — Other Ambulatory Visit: Payer: Self-pay | Admitting: *Deleted

## 2015-02-04 ENCOUNTER — Ambulatory Visit (INDEPENDENT_AMBULATORY_CARE_PROVIDER_SITE_OTHER): Payer: Medicare HMO | Admitting: Clinical

## 2015-02-04 ENCOUNTER — Encounter (HOSPITAL_COMMUNITY): Payer: Self-pay | Admitting: Clinical

## 2015-02-04 DIAGNOSIS — F250 Schizoaffective disorder, bipolar type: Secondary | ICD-10-CM | POA: Diagnosis not present

## 2015-02-04 NOTE — Patient Outreach (Signed)
[facility]) Care Management   02/04/2015  Tiffany [name] April 06, 1960 [id number]  Tiffany [name] is an 55 y.o. female  Subjective:   Member states that she is "doing all right."  She still complains of shortness of breath and some coughing, stating "but I'm still smoking."  Education provided on smoking cessation, hypertension management, and COPD management.  She state that she has some Nicotine patches and had a quit date for today to stop smoking.  She states that she has 3 more cigarettes left and won't by anymore once they are gone and will instead start using the patches.  She reports that she continues to see her counselor for mental concerns monthly, and has an appointment today.  She states that things are "a little better."    Objective:   Review of Systems  Constitutional: Negative.   HENT: Negative.   Eyes: Negative.   Respiratory: Positive for cough and shortness of breath.   Cardiovascular: Negative.   Gastrointestinal: Negative.   Musculoskeletal: Negative.   Skin: Negative.   Neurological: Negative.   Endo/Heme/Allergies: Negative.   Psychiatric/Behavioral: Negative.     Physical Exam  Constitutional: [name] oriented to person, place, and time. She appears well-developed and well-nourished.  Neck: Normal range of motion.  Cardiovascular: Normal rate, regular rhythm and normal heart sounds.   Respiratory: Effort normal and breath sounds normal.  GI: Soft. Bowel sounds are normal.  Musculoskeletal: Normal range of motion.  Neurological: [name] alert and oriented to person, place, and time.  Skin: Skin is warm and dry.    BP 138/72 mmHg  Pulse 80  Resp 22  SpO2 94%   Current Medications:   Current Outpatient Prescriptions  Medication Sig Dispense Refill  . ACCU-CHEK SOFTCLIX LANCETS lancets     . albuterol (PROVENTIL HFA;VENTOLIN HFA) 108 (90 BASE) MCG/ACT inhaler Inhale 2 puffs into the lungs every 6 (six) hours as needed. 1 Inhaler 6   . albuterol (PROVENTIL) (2.5 MG/3ML) 0.083% nebulizer solution Take 3 mLs (2.5 mg total) by nebulization every 6 (six) hours as needed for wheezing or shortness of breath. (Patient not taking: Reported on 01/12/2015) 150 mL 1  . aspirin 81 MG EC tablet TAKE 1 TABLET (81 MG TOTAL) BY MOUTH DAILY. 30 tablet 0  . atorvastatin (LIPITOR) 40 MG tablet Take 1 tablet (40 mg total) by mouth daily. 90 tablet 4  . budesonide-formoterol (SYMBICORT) 160-4.5 MCG/ACT inhaler Inhale 2 puffs into the lungs 2 (two) times daily. 1 Inhaler 6  . diphenhydrAMINE (BENADRYL) 25 mg capsule Take 50 mg by mouth every 6 (six) hours as needed.    [name] escitalopram (LEXAPRO) 20 MG tablet Take 1 and 1/2 tab daily 135 tablet 0  . fluticasone (FLONASE) 50 MCG/ACT nasal spray Place 2 sprays into both nostrils daily. (Patient not taking: Reported on 12/16/2014) 16 g 3  . furosemide (LASIX) 40 MG tablet Take 1 tablet (40 mg total) by mouth daily. 90 tablet 3  . gabapentin (NEURONTIN) 600 MG tablet Take 1 tablet (600 mg total) by mouth 3 (three) times daily. 270 tablet 4  . lisinopril (PRINIVIL,ZESTRIL) 10 MG tablet TAKE 1 TABLET EVERY DAY 90 tablet 0  . lisinopril (PRINIVIL,ZESTRIL) 40 MG tablet Take 1 tablet (40 mg total) by mouth daily. 90 tablet 3  . metFORMIN (GLUCOPHAGE-XR) 500 MG 24 hr tablet TAKE 2 TABLETS EVERY DAY  WITH  BREAKFAST 180 tablet 3  . mirtazapine (REMERON) 15 MG tablet Take 1 tablet (15 mg  total) by mouth at bedtime. 90 tablet 0  . montelukast (SINGULAIR) 10 MG tablet Take 1 tablet (10 mg total) by mouth at bedtime. 90 tablet 3  . Multiple Vitamins-Minerals (MULTIVITAMIN WITH MINERALS) tablet Take 1 tablet by mouth daily.    . naproxen (NAPROSYN) 500 MG tablet Take 1 tablet (500 mg total) by mouth 3 (three) times daily as needed. 270 tablet 3  . nicotine (NICODERM CQ - DOSED IN MG/24 HOURS) 21 mg/24hr patch Place 1 patch (21 mg total) onto the skin daily. (Patient not taking: Reported on 01/12/2015) 28 patch 0  .  omeprazole (PRILOSEC) 40 MG capsule Take 1 capsule (40 mg total) by mouth daily. 90 capsule 1  . oxybutynin (DITROPAN) 5 MG tablet Take 1 tablet (5 mg total) by mouth 3 (three) times daily. 270 tablet 3  . oxymetazoline (AFRIN) 0.05 % nasal spray Place 1 spray into both nostrils 2 (two) times daily.    . potassium chloride (K-DUR) 10 MEQ tablet Take 2 tablets (20 mEq total) by mouth daily. 180 tablet 2  . terbinafine (LAMISIL) 250 MG tablet Take 1 tablet (250 mg total) by mouth daily. 30 tablet 1  . tiotropium (SPIRIVA HANDIHALER) 18 MCG inhalation capsule Place 1 capsule (18 mcg total) into inhaler and inhale daily. 90 capsule 4  . verapamil (VERELAN PM) 240 MG 24 hr capsule Take 2 capsules (480 mg total) by mouth at bedtime. 60 capsule 0  . ziprasidone (GEODON) 80 MG capsule Take 1 capsule (80 mg total) by mouth 2 (two) times daily with a meal. 180 capsule 0   No current facility-administered medications for this visit.    Functional Status:   In your present state of health, do you have any difficulty performing the following activities: 10/09/2014 07/31/2014  Hearing? Y N  Vision? Y Y  Difficulty concentrating or making decisions? Y N  Walking or climbing stairs? Y Y  Dressing or bathing? Y Y  Doing errands, shopping? Y N  Preparing Food and eating ? N N  Using the Toilet? N N  In the past six months, have you accidently leaked urine? N N  Do you have problems with loss of bowel control? N N  Managing your Medications? N N  Managing your Finances? Y N  Housekeeping or managing your Housekeeping? N Y    Fall/Depression Screening:    PHQ 2/9 Scores 01/20/2015 12/17/2014 12/07/2014 10/20/2014 09/24/2014 08/17/2014 07/22/2014  PHQ - 2 Score 0 3 0 0 3 2 0  PHQ- 9 Score - - - - 16 4 -    Assessment:    According to last PCP office note, member was instructed to schedule appointment with Dr. Valentina Lucks to manage blood pressure.  Member has not scheduled appointment yet.  She also states that she is  in need of a new nebulizer machine.  Call placed to physician office twice to place request for new machine and to schedule with Dr. Valentina Lucks, line was busy both times.  Member made aware of the importance of scheduling the appointment and obtaining new equipment, instructed to call office back later today and tomorrow morning if needed.  She verbalizes understanding.  Member denies any other concerns or needs from this care manager.  Discussed the possibility of having a health coach be involved with her care, providing ongoing education regarding management of her COPD and smoking cessation.  Member agrees, but his care manager will not transition member until equipment is ordered/received.  Plan:   Will  provide follow up call to member in 2 weeks to ensure appointment with Dr. Valentina Lucks was made and nebulizer machine was ordered.

## 2015-02-04 NOTE — Progress Notes (Signed)
   THERAPIST PROGRESS NOTE  Session Time: 4:33 -5:27  Participation Level: Active  Behavioral Response: CasualAlertDepressed  Type of Therapy: Individual Therapy  Treatment Goals addressed: improve psychiatric symptoms, Improve unhelpful thought patterns,  Mood stabilization, interpersonal relationship skills, stress management  Interventions: CBT and Motivational Interviewing, Grounding and Mindfulness Techniques  Summary: Tiffany [name] is a 55 y.o. female who presents with schizoaffective disorder, bipolar type  Suicidal/Homicidal: No -without intent/plan  Therapist Response: [place] met with clinician for an individual session. Fortunata discussed her psychiatric symptoms, her current life events and her homework. She shared she used her stress management techniques and also was able to remain abstinent since last session. Luwana shared that she had some thoughts of using but was able to abstain. Dakayla shared that she has a relationship with a man who drinks. Client and clinician discussed the challenges of being around mood altering substances (even though they are not her preferred drug) while maintaining abstinence. She shared that she thought about the consequences and the look in her son's eyes. Jaaliyah shared she was concerned she would relapse because she was recently paid. She was able to identify some healthy coping skills that would improve her recovery. Jarelly shared that she was going to give her money to her son to hold. Aashvi shared about her love of her son and the importance of their relationship. Client and clinician discussed the behaviors that are important to maintain a healthy relationship. Lula shared her insight that if she returns to using drugs then she would have increased psychiatric symptoms and possibly legal consequences. She shared that both would take her away from being a stable force in her son's life. Mickayla stated that they have been looking for new housing. [name] and  clinician discussed the stress management skills she has been using and ways to improve her practice.Dejanae agreed to practice them. [name] and clinician discussed the importance of maintaining a stable mood. [name] and clinician discussed times (when she is alone) her mood and thoughts were more negative. [name] and clinician discussed how to be prepared for those times prior and how to manage them better.    Plan: Return again in 2-3 weeks.  Diagnosis: Axis I:   Schizoaffective disorder, bipolar type    Mayo Owczarzak A, LCSW 02/04/2015

## 2015-02-08 ENCOUNTER — Other Ambulatory Visit: Payer: Self-pay | Admitting: Family Medicine

## 2015-02-08 DIAGNOSIS — I10 Essential (primary) hypertension: Secondary | ICD-10-CM

## 2015-02-08 NOTE — Telephone Encounter (Signed)
Pt called and needs a new Glucose meter and Nebulizer called in. jw

## 2015-02-09 ENCOUNTER — Telehealth: Payer: Self-pay | Admitting: Family Medicine

## 2015-02-09 MED ORDER — BUDESONIDE-FORMOTEROL FUMARATE 160-4.5 MCG/ACT IN AERO: 2.0000 | INHALATION_SPRAY | Freq: Two times a day (BID) | RESPIRATORY_TRACT | Status: DC

## 2015-02-09 MED ORDER — LISINOPRIL 40 MG PO TABS: 40.0000 mg | ORAL_TABLET | Freq: Every day | ORAL | Status: DC

## 2015-02-09 MED ORDER — VERAPAMIL HCL ER 240 MG PO CP24: 480.0000 mg | ORAL_CAPSULE | Freq: Every day | ORAL | Status: DC

## 2015-02-09 NOTE — Telephone Encounter (Signed)
Please contact the patient and see if she needs the actual nebulizer machine or a specific medication (such as albuterol)?    Additionally, let her know that verapamil, lisinopril and symbicort were sent into [facility].  Thanks, Archie Patten, MD [facility] Family Medicine Resident

## 2015-02-09 NOTE — Telephone Encounter (Signed)
Needs new nebulizer machine and the albuterol that goes with it

## 2015-02-09 NOTE — Telephone Encounter (Signed)
Pt called and needs these medication refilled and sent to her online pharmacy [facility]. Verapamil, Lisinopril, Symbicort. Tiffany [name]

## 2015-02-09 NOTE — Telephone Encounter (Signed)
Left message on voicemail asking that patient call back with the information below.

## 2015-02-14 ENCOUNTER — Emergency Department (HOSPITAL_COMMUNITY)
Admission: EM | Admit: 2015-02-14 | Discharge: 2015-02-14 | Disposition: A | Discharge status: Home / Self Care | Payer: Commercial Managed Care - HMO | Attending: Emergency Medicine | Admitting: Emergency Medicine

## 2015-02-14 ENCOUNTER — Encounter (HOSPITAL_COMMUNITY): Payer: Self-pay | Admitting: Emergency Medicine

## 2015-02-14 ENCOUNTER — Emergency Department (HOSPITAL_COMMUNITY): Payer: Commercial Managed Care - HMO

## 2015-02-14 DIAGNOSIS — I10 Essential (primary) hypertension: Secondary | ICD-10-CM | POA: Diagnosis not present

## 2015-02-14 DIAGNOSIS — Z8673 Personal history of transient ischemic attack (TIA), and cerebral infarction without residual deficits: Secondary | ICD-10-CM | POA: Insufficient documentation

## 2015-02-14 DIAGNOSIS — R05 Cough: Secondary | ICD-10-CM | POA: Diagnosis present

## 2015-02-14 DIAGNOSIS — Z9981 Dependence on supplemental oxygen: Secondary | ICD-10-CM | POA: Diagnosis not present

## 2015-02-14 DIAGNOSIS — J45909 Unspecified asthma, uncomplicated: Secondary | ICD-10-CM | POA: Insufficient documentation

## 2015-02-14 DIAGNOSIS — R042 Hemoptysis: Secondary | ICD-10-CM | POA: Insufficient documentation

## 2015-02-14 DIAGNOSIS — J029 Acute pharyngitis, unspecified: Secondary | ICD-10-CM | POA: Insufficient documentation

## 2015-02-14 DIAGNOSIS — J441 Chronic obstructive pulmonary disease with (acute) exacerbation: Secondary | ICD-10-CM | POA: Insufficient documentation

## 2015-02-14 DIAGNOSIS — E119 Type 2 diabetes mellitus without complications: Secondary | ICD-10-CM | POA: Insufficient documentation

## 2015-02-14 DIAGNOSIS — F17210 Nicotine dependence, cigarettes, uncomplicated: Secondary | ICD-10-CM | POA: Insufficient documentation

## 2015-02-14 DIAGNOSIS — F419 Anxiety disorder, unspecified: Secondary | ICD-10-CM | POA: Insufficient documentation

## 2015-02-14 DIAGNOSIS — E7800 Pure hypercholesterolemia, unspecified: Secondary | ICD-10-CM | POA: Diagnosis not present

## 2015-02-14 DIAGNOSIS — I252 Old myocardial infarction: Secondary | ICD-10-CM | POA: Insufficient documentation

## 2015-02-14 DIAGNOSIS — F319 Bipolar disorder, unspecified: Secondary | ICD-10-CM | POA: Diagnosis not present

## 2015-02-14 DIAGNOSIS — G4733 Obstructive sleep apnea (adult) (pediatric): Secondary | ICD-10-CM | POA: Diagnosis not present

## 2015-02-14 LAB — BASIC METABOLIC PANEL
Anion gap: 8 (ref 5–15)
BUN: 8 mg/dL (ref 6–20)
CO2: 29 mmol/L (ref 22–32)
Calcium: 8.5 mg/dL — ABNORMAL LOW (ref 8.9–10.3)
Chloride: 104 mmol/L (ref 101–111)
Creatinine, Ser: 1.1 mg/dL — ABNORMAL HIGH (ref 0.44–1.00)
GFR calc Af Amer: >60 mL/min (ref >60)
GFR calc non Af Amer: 55 mL/min — ABNORMAL LOW (ref >60)
Glucose, Bld: 96 mg/dL (ref 65–99)
Potassium: 3.7 mmol/L (ref 3.5–5.1)
Sodium: 141 mmol/L (ref 135–145)

## 2015-02-14 LAB — CBC WITH DIFFERENTIAL/PLATELET
Basophils Absolute: 0.1 10*3/uL (ref 0.0–0.1)
Basophils Relative: 1 %
Eosinophils Absolute: 0.4 10*3/uL (ref 0.0–0.7)
Eosinophils Relative: 4 %
HCT: 37.4 % (ref 36.0–46.0)
Hemoglobin: 11.5 g/dL — ABNORMAL LOW (ref 12.0–15.0)
Lymphocytes Relative: 33 %
Lymphs Abs: 3.4 10*3/uL (ref 0.7–4.0)
MCH: 29.1 pg (ref 26.0–34.0)
MCHC: 30.7 g/dL (ref 30.0–36.0)
MCV: 94.7 fL (ref 78.0–100.0)
Monocytes Absolute: 0.5 10*3/uL (ref 0.1–1.0)
Monocytes Relative: 4 %
Neutro Abs: 6.1 10*3/uL (ref 1.7–7.7)
Neutrophils Relative %: 58 %
Platelets: 353 10*3/uL (ref 150–400)
RBC: 3.95 MIL/uL (ref 3.87–5.11)
RDW: 14.7 % (ref 11.5–15.5)
WBC: 10.4 10*3/uL (ref 4.0–10.5)

## 2015-02-14 MED ORDER — DOXYCYCLINE HYCLATE 100 MG PO CAPS: 100.0000 mg | ORAL_CAPSULE | Freq: Two times a day (BID) | ORAL | Status: DC

## 2015-02-14 MED ORDER — PREDNISONE 50 MG PO TABS: 50.0000 mg | ORAL_TABLET | Freq: Every day | ORAL | Status: AC

## 2015-02-14 MED ORDER — IPRATROPIUM-ALBUTEROL 0.5-2.5 (3) MG/3ML IN SOLN
3.0000 mL | Freq: Once | RESPIRATORY_TRACT | Status: AC
  Administered 2015-02-14: 3 mL via RESPIRATORY_TRACT
  Filled 2015-02-14: qty 3

## 2015-02-14 NOTE — ED Provider Notes (Signed)
CSN: [id number]     Arrival date & time 02/14/15  [id number] History   First MD Initiated Contact with Patient 02/14/15 [phone]     Chief Complaint  Patient presents with  . Cough  . Nasal Congestion     (Consider location/radiation/quality/duration/timing/severity/associated sxs/prior Treatment) HPI Comments: Tiffany [name] is a 55 y.o. F with PMH of COPD, OSA on CPAP, HTN, T2DM, bipolar 1 disorder presenting to ED for evaluation of cough.  Patient reports ongoing URI symptoms, including cough, nasal congestion, sore throat, hoarseness, and wheezing x1 week.  Cough was initially productive of gray sputum, but she noticed dark red sputum production several times this AM and became concerned.  She reports symptoms were starting to improve, but she became concerned about hemoptysis and decided to be evaluated.  She reports an episode of hemoptysis with pneumonia several years ago.  She has also had wheezing for 1 week for which she has increased use of albuterol inhaler.  She typically uses albuterol about evry 6 hours, but has been using it every hour while awake recently.  She reports this improves wheezing and SOB.  She denies any chest pain, but reports lungs feel as though they are irritated and "on fire."  Patient has tried Nyquil, Dayquil, Alka Seltzer cold plus, and robutussin without improvement in cough.  She reports that she continues to smoke 1 PPD and knows this is not good for her.  She denies any recent surgeries, periods of immobility, LE edema, or fevers.  Patient is a 55 y.o. female presenting with cough. The history is provided by the patient.  Cough Cough characteristics:  Productive Sputum characteristics:  Bloody Severity:  Moderate Onset quality:  Gradual Duration:  7 days Timing:  Intermittent Progression:  Unchanged Chronicity:  New Smoker: yes   Context: sick contacts and upper respiratory infection   Relieved by:  Nothing Worsened by:  Nothing tried Ineffective treatments:   Beta-agonist inhaler, decongestant and cough suppressants Associated symptoms: shortness of breath, sinus congestion, sore throat and wheezing   Associated symptoms: no chest pain, no chills, no ear pain, no eye discharge, no fever and no rhinorrhea     Past Medical History  Diagnosis Date  . High blood pressure   . Incontinence   . Anxiety   . Manic depression ([place])   . Heart attack ([place])   . Diabetes mellitus     class 2  . Asthma   . Emphysema   . High cholesterol   . Stroke ([place])   . Sinus trouble   . Seasonal allergies   . Sleep apnea   . Disorder of nervous system   . Myocardial infarct ([place])   . Complication of anesthesia     decreased bp, decreased heart rate   Past Surgical History  Procedure Laterality Date  . Abdominal hysterectomy    . Cesarean section    . Hiatal hernia repair     Family History  Problem Relation Age of Onset  . Cancer Father     prostate  . Heart failure      cousin  . Cancer Mother     lung  . Depression Mother   . Depression Sister   . Anxiety disorder Sister   . Schizophrenia Sister   . Bipolar disorder Sister   . Depression Sister   . Depression Brother    Social History  Substance Use Topics  . Smoking status: Current Every Day Smoker -- 0.50 packs/day for 37 years  Types: Cigarettes    Start date: 01/16/1977  . Smokeless tobacco: Never Used     Comment: Previous 2 PPD menthol full flavor  . Alcohol Use: No   OB History    No data available     Review of Systems  Constitutional: Negative for fever and chills.  HENT: Positive for congestion, sore throat and voice change. Negative for ear pain, postnasal drip, rhinorrhea and trouble swallowing.   Eyes: Negative for pain, discharge and redness.  Respiratory: Positive for cough, shortness of breath and wheezing. Negative for chest tightness.   Cardiovascular: Negative for chest pain and leg swelling.  All other systems reviewed and are negative.     Allergies   Hydroxyzine; Codeine; and Sulfa antibiotics  Home Medications   Prior to Admission medications   Medication Sig Start Date End Date Taking? Authorizing Provider  ACCU-CHEK SOFTCLIX LANCETS lancets  07/28/14   Historical Provider, MD  albuterol (PROVENTIL HFA;VENTOLIN HFA) 108 (90 BASE) MCG/ACT inhaler Inhale 2 puffs into the lungs every 6 (six) hours as needed. 10/16/14   Archie Patten, MD  albuterol (PROVENTIL) (2.5 MG/3ML) 0.083% nebulizer solution Take 3 mLs (2.5 mg total) by nebulization every 6 (six) hours as needed for wheezing or shortness of breath. Patient not taking: Reported on 01/12/2015 10/20/14   Archie Patten, MD  aspirin 81 MG EC tablet TAKE 1 TABLET (81 MG TOTAL) BY MOUTH DAILY. 08/03/14   Archie Patten, MD  atorvastatin (LIPITOR) 40 MG tablet Take 1 tablet (40 mg total) by mouth daily. 03/27/14   [place], MD  budesonide-formoterol [facility]) 160-4.5 MCG/ACT inhaler Inhale 2 puffs into the lungs 2 (two) times daily. 02/09/15   Archie Patten, MD  diphenhydrAMINE (BENADRYL) 25 mg capsule Take 50 mg by mouth every 6 (six) hours as needed.    Historical Provider, MD  doxycycline (VIBRAMYCIN) 100 MG capsule Take 1 capsule (100 mg total) by mouth 2 (two) times daily. 02/14/15   Virginia Crews, MD  escitalopram (LEXAPRO) 20 MG tablet Take 1 and 1/2 tab daily 01/27/15   Kathlee Nations, MD  fluticasone (FLONASE) 50 MCG/ACT nasal spray Place 2 sprays into both nostrils daily. Patient not taking: Reported on 02/04/2015 12/31/13   Sharon [address], MD  furosemide (LASIX) 40 MG tablet Take 1 tablet (40 mg total) by mouth daily. 10/20/14   Archie Patten, MD  gabapentin (NEURONTIN) 600 MG tablet Take 1 tablet (600 mg total) by mouth 3 (three) times daily. 01/21/15   Archie Patten, MD  lisinopril (PRINIVIL,ZESTRIL) 40 MG tablet Take 1 tablet (40 mg total) by mouth daily. 02/09/15   Archie Patten, MD  metFORMIN (GLUCOPHAGE-XR) 500 MG 24 hr tablet TAKE 2 TABLETS  EVERY DAY  WITH  BREAKFAST 08/10/14   Archie Patten, MD  mirtazapine (REMERON) 15 MG tablet Take 1 tablet (15 mg total) by mouth at bedtime. 01/27/15   Kathlee Nations, MD  montelukast (SINGULAIR) 10 MG tablet Take 1 tablet (10 mg total) by mouth at bedtime. 07/22/14   Virginia Crews, MD  Multiple Vitamins-Minerals (MULTIVITAMIN WITH MINERALS) tablet Take 1 tablet by mouth daily.    Historical Provider, MD  naproxen (NAPROSYN) 500 MG tablet Take 1 tablet (500 mg total) by mouth 3 (three) times daily as needed. 07/22/14   Virginia Crews, MD  nicotine (NICODERM CQ - DOSED IN MG/24 HOURS) 21 mg/24hr patch Place 1 patch (21 mg total) onto the skin daily. Patient not  taking: Reported on 01/12/2015 07/22/14   Virginia Crews, MD  omeprazole (PRILOSEC) 40 MG capsule Take 1 capsule (40 mg total) by mouth daily. 12/08/14   Archie Patten, MD  oxybutynin (DITROPAN) 5 MG tablet Take 1 tablet (5 mg total) by mouth 3 (three) times daily. 07/22/14   Virginia Crews, MD  oxymetazoline (AFRIN) 0.05 % nasal spray Place 1 spray into both nostrils 2 (two) times daily.    Historical Provider, MD  potassium chloride (K-DUR) 10 MEQ tablet Take 2 tablets (20 mEq total) by mouth daily. 03/27/14   [place], MD  predniSONE (DELTASONE) 50 MG tablet Take 1 tablet (50 mg total) by mouth daily with breakfast. 02/14/15 02/18/15  Virginia Crews, MD  terbinafine (LAMISIL) 250 MG tablet Take 1 tablet (250 mg total) by mouth daily. 01/20/15   Archie Patten, MD  tiotropium (SPIRIVA HANDIHALER) 18 MCG inhalation capsule Place 1 capsule (18 mcg total) into inhaler and inhale daily. 10/20/14   Archie Patten, MD  verapamil (VERELAN PM) 240 MG 24 hr capsule Take 2 capsules (480 mg total) by mouth at bedtime. 02/09/15   Archie Patten, MD  ziprasidone (GEODON) 80 MG capsule Take 1 capsule (80 mg total) by mouth 2 (two) times daily with a meal. 01/27/15   Kathlee Nations, MD   BP 127/89 mmHg  Pulse 72  Temp(Src) 98  F (36.7 C) (Oral)  Resp 20  SpO2 97% Physical Exam  Constitutional: [name] oriented to person, place, and time. She appears well-developed and well-nourished. No distress.  HENT:  Head: Normocephalic and atraumatic.  Mouth/Throat: Oropharynx is clear and moist. No oropharyngeal exudate.  TMs clear b/l  Eyes: Conjunctivae and EOM are normal. Pupils are equal, round, and reactive to light. Right eye exhibits no discharge. Left eye exhibits no discharge. No scleral icterus.  Neck: Neck supple.  Cardiovascular: Normal rate, regular rhythm, normal heart sounds and intact distal pulses.   No murmur heard. Pulmonary/Chest: Effort normal. No respiratory distress. She exhibits no tenderness.  Speaks in complete sentences, no accessory muscle use Diffuse wheezing, but good air movement No coughing during exam  Abdominal: Soft. Bowel sounds are normal. She exhibits no distension. There is no tenderness. There is no rebound and no guarding.  Musculoskeletal: She exhibits no edema or tenderness.  Lymphadenopathy:    She has no cervical adenopathy.  Neurological: [name] alert and oriented to person, place, and time. No cranial nerve deficit.  Skin: Skin is warm and dry. No rash noted.  Psychiatric: She has a normal mood and affect. Her behavior is normal.    ED Course  Procedures (including critical care time) Labs Review Labs Reviewed  BASIC METABOLIC PANEL - Abnormal; Notable for the following:    Creatinine, Ser 1.10 (*)    Calcium 8.5 (*)    GFR calc non Af Amer 55 (*)    All other components within normal limits  CBC WITH DIFFERENTIAL/PLATELET - Abnormal; Notable for the following:    Hemoglobin 11.5 (*)    All other components within normal limits    Imaging Review Dg Chest 2 View  02/14/2015  CLINICAL DATA:  Intermittent cough EXAM: CHEST  2 VIEW COMPARISON:  07/07/2014 FINDINGS: The heart size and mediastinal contours are within normal limits. Both lungs are clear. The  visualized skeletal structures are unremarkable. IMPRESSION: No active cardiopulmonary disease. Electronically Signed   By: Inez Catalina M.D.   On: 02/14/2015 10:20  I have personally reviewed and evaluated these images and lab results as part of my medical decision-making.   EKG Interpretation None      MDM   Final diagnoses:  COPD with exacerbation ([place])  Hemoptysis    55 y.o. F with COPD presenting for hemoptysis.  CXR shows no active cardiopulmonary disease.  Hgb stable at 11.5 (previously 11.1 in 06/2014). WBC normal, afebrile currently, VSS, nontoxic in appearance. Wheezing improved after duoneb.  Satting well on RA.  Suspect COPD exacerbation causing symptoms.  Low suspicion for PE, PNA, or cardiac origin.  Able to ambulate to bathroom independently and maintain respiratory status prior to d/c. Will treat with prednisone and doxycycline.  Patient advised to follow-up with primary care physician this week.    Virginia Crews, MD 02/14/15 1043  Leonard Schwartz, MD 02/14/15 1050

## 2015-02-14 NOTE — ED Notes (Signed)
Cough and nasal congestion x 1 week; increasingly hoarse and feels like lungs on fire. States noted bloody mucus today with coughing.

## 2015-02-14 NOTE — Discharge Instructions (Signed)
Hemoptysis Hemoptysis means coughing up blood. The blood may come from the lungs and airways. It can also come from bleeding that occurs outside the lungs and airways. Coughing up blood can be a sign of a minor problem or a serious medical condition.  HOME CARE  Only take medicine as told by your doctor. Do not use medicines that help you stop coughing (cough suppressants) unless your doctor approves.  If you are given antibiotic medicine, take it as told. Finish it even if you start to feel better.  Do not smoke. Also avoid being around others when they are smoking.  Follow up with your doctor as told. GET HELP RIGHT AWAY IF:  You cough up bloody spit (mucus) for longer than a week.  You have a blood-producing cough that is severe or getting worse.  You have a blood-producing cough thatcomes and goes over time.  You have trouble breathing.   You throw up (vomit) blood.  You have bloody or black poop (stool).  You have chest pain.   You have night sweats.  You feel faint or pass out.   You have a fever or lasting symptoms for more than 2-3 days.  You have a fever and your symptoms suddenly get worse. MAKE SURE YOU:  Understand these instructions.  Will watch your condition.  Will get help right away if you are not doing well or get worse.   This information is not intended to replace advice given to you by your health care provider. Make sure you discuss any questions you have with your health care provider.   Document Released: 12/20/2011 Document Reviewed: 12/20/2011 Elsevier Interactive Patient Education 2016 Elsevier Inc.   Chronic Obstructive Pulmonary Disease Chronic obstructive pulmonary disease (COPD) is a common lung condition in which airflow from the lungs is limited. COPD is a general term that can be used to describe many different lung problems that limit airflow, including both chronic bronchitis and emphysema. If you have COPD, your lung  function will probably never return to normal, but there are measures you can take to improve lung function and make yourself feel better. CAUSES   Smoking (common).  Exposure to secondhand smoke.  Genetic problems.  Chronic inflammatory lung diseases or recurrent infections. SYMPTOMS  Shortness of breath, especially with physical activity.  Deep, persistent (chronic) cough with a large amount of thick mucus.  Wheezing.  Rapid breaths (tachypnea).  Gray or bluish discoloration (cyanosis) of the skin, especially in your fingers, toes, or lips.  Fatigue.  Weight loss.  Frequent infections or episodes when breathing symptoms become much worse (exacerbations).  Chest tightness. DIAGNOSIS Your health care provider will take a medical history and perform a physical examination to diagnose COPD. Additional tests for COPD may include:  Lung (pulmonary) function tests.  Chest X-ray.  CT scan.  Blood tests. TREATMENT  Treatment for COPD may include:  Inhaler and nebulizer medicines. These help manage the symptoms of COPD and make your breathing more comfortable.  Supplemental oxygen. Supplemental oxygen is only helpful if you have a low oxygen level in your blood.  Exercise and physical activity. These are beneficial for nearly all people with COPD.  Lung surgery or transplant.  Nutrition therapy to gain weight, if you are underweight.  Pulmonary rehabilitation. This may involve working with a team of health care providers and specialists, such as respiratory, occupational, and physical therapists. HOME CARE INSTRUCTIONS  Take all medicines (inhaled or pills) as directed by your health care provider.  Avoid over-the-counter medicines or cough syrups that dry up your airway (such as antihistamines) and slow down the elimination of secretions unless instructed otherwise by your health care provider.  If you are a smoker, the most important thing that you can do is stop  smoking. Continuing to smoke will cause further lung damage and breathing trouble. Ask your health care provider for help with quitting smoking. He or she can direct you to community resources or hospitals that provide support.  Avoid exposure to irritants such as smoke, chemicals, and fumes that aggravate your breathing.  Use oxygen therapy and pulmonary rehabilitation if directed by your health care provider. If you require home oxygen therapy, ask your health care provider whether you should purchase a pulse oximeter to measure your oxygen level at home.  Avoid contact with individuals who have a contagious illness.  Avoid extreme temperature and humidity changes.  Eat healthy foods. Eating smaller, more frequent meals and resting before meals may help you maintain your strength.  Stay active, but balance activity with periods of rest. Exercise and physical activity will help you maintain your ability to do things you want to do.  Preventing infection and hospitalization is very important when you have COPD. Make sure to receive all the vaccines your health care provider recommends, especially the pneumococcal and influenza vaccines. Ask your health care provider whether you need a pneumonia vaccine.  Learn and use relaxation techniques to manage stress.  Learn and use controlled breathing techniques as directed by your health care provider. Controlled breathing techniques include:  Pursed lip breathing. Start by breathing in (inhaling) through your nose for 1 second. Then, purse your lips as if you were going to whistle and breathe out (exhale) through the pursed lips for 2 seconds.  Diaphragmatic breathing. Start by putting one hand on your abdomen just above your waist. Inhale slowly through your nose. The hand on your abdomen should move out. Then purse your lips and exhale slowly. You should be able to feel the hand on your abdomen moving in as you exhale.  Learn and use controlled  coughing to clear mucus from your lungs. Controlled coughing is a series of short, progressive coughs. The steps of controlled coughing are: 1. Lean your head slightly forward. 2. Breathe in deeply using diaphragmatic breathing. 3. Try to hold your breath for 3 seconds. 4. Keep your mouth slightly open while coughing twice. 5. Spit any mucus out into a tissue. 6. Rest and repeat the steps once or twice as needed. SEEK MEDICAL CARE IF:  You are coughing up more mucus than usual.  There is a change in the color or thickness of your mucus.  Your breathing is more labored than usual.  Your breathing is faster than usual. SEEK IMMEDIATE MEDICAL CARE IF:  You have shortness of breath while you are resting.  You have shortness of breath that prevents you from:  Being able to talk.  Performing your usual physical activities.  You have chest pain lasting longer than 5 minutes.  Your skin color is more cyanotic than usual.  You measure low oxygen saturations for longer than 5 minutes with a pulse oximeter. MAKE SURE YOU:  Understand these instructions.  Will watch your condition.  Will get help right away if you are not doing well or get worse.   This information is not intended to replace advice given to you by your health care provider. Make sure you discuss any questions you have with your health  care provider.   Document Released: 10/12/2004 Document Revised: 01/23/2014 Document Reviewed: 08/29/2012 Elsevier Interactive Patient Education [employer].

## 2015-02-16 ENCOUNTER — Telehealth (HOSPITAL_COMMUNITY): Payer: Self-pay

## 2015-02-16 NOTE — Telephone Encounter (Signed)
Fax received from [facility] approving patient's Lexapro 20 mg through until 02/15/16.

## 2015-02-18 ENCOUNTER — Encounter (HOSPITAL_COMMUNITY): Payer: Self-pay | Admitting: Clinical

## 2015-02-18 ENCOUNTER — Other Ambulatory Visit: Payer: Self-pay | Admitting: *Deleted

## 2015-02-18 ENCOUNTER — Ambulatory Visit (HOSPITAL_COMMUNITY): Payer: Self-pay | Admitting: Clinical

## 2015-02-18 ENCOUNTER — Ambulatory Visit (INDEPENDENT_AMBULATORY_CARE_PROVIDER_SITE_OTHER): Payer: Medicare HMO | Admitting: Clinical

## 2015-02-18 DIAGNOSIS — F250 Schizoaffective disorder, bipolar type: Secondary | ICD-10-CM | POA: Diagnosis not present

## 2015-02-18 NOTE — Telephone Encounter (Signed)
Per Tamika you have to sign off on the form before patient comes in. [name] off this afternoon but will be here all day Friday. Deseree Kennon Holter, CMA

## 2015-02-18 NOTE — Patient Outreach (Signed)
Call placed to member to inform her that Dr. Libby Maw office has made attempts to contact her to prepare paperwork for a new nebulizer machine.  Member informed to contact the office and schedule an appointment with Latina Craver (per Dr. Lorenso Courier) for paperwork completion.  Member states that she is also in need of a glucose meter.  [name] advised to discuss this need with Ms. Hassell Done.  This care manager will provide call back to member next week to follow up on progress.  [name], BSN, [place] [facility]  [facility] Care Manager [phone]

## 2015-02-18 NOTE — Telephone Encounter (Signed)
-----   Message from Archie Patten, MD sent at 02/12/2015 11:55 AM EST ----- Will you call the patient and ask her to come in to meet with Tamika about getting paperwork filled out to see if insurance will cover her obtaining a new nebulizer machine. She has to come in and complete the paperwork prior to the process beginning.  Thanks, Crystal D

## 2015-02-18 NOTE — Progress Notes (Signed)
   THERAPIST PROGRESS NOTE  Session Time: 2:32 -2:30  Participation Level: Active  Behavioral Response: CasualAlertDepressed  Type of Therapy: Individual Therapy  Treatment Goals addressed: improve psychiatric symptoms, Improve unhelpful thought patterns,  Mood stabilization, stress management  Interventions: CBT and Motivational Interviewing,  Summary: Tiffany [name] is a 55 y.o. female who presents with schizoaffective disorder, bipolar type  Suicidal/Homicidal: No -without intent/plan  Therapist Response: [name] met with clinician for an individual session. [name] discussed her psychiatric symptoms and  her current life events. She shared that she used 2 days ago. Client and clinician discussed the possibility of her attending IOP. She had not been interested when mentioned before but  Says she is now realizing that she might need more support. Clinician gave her the information about IOP. Client and clinician  Discussed her recent life events. Client and clinician discussed her relapses and triggers for relapse. She shared that she had gotten pneumonia and that her mood was down. She shared that her son is very very important to her. Client and clinician discussed her goals - having a stable place for her son.Client and clinician discussed stress reduction techniques. [name] shared all her goals that would be wrecked by continuing using. She also identified the things that would increase the likelihood for relapse. She shared she was getting paid tomorrow.She shared that her plan is to give her money to her son to care take. Client and clinician discussed how her decision to hand over her funds would positively or negatively affect her mood. When asked open need questions [name] identified ways she could help stabilize her mood so that she was less likely to relapse and to follow through in her goals.  Plan: Return again in 2-3 weeks.  Diagnosis: Axis I:   Schizoaffective disorder, bipolar  type    Lorma Heater A, LCSW 02/18/2015

## 2015-02-22 ENCOUNTER — Ambulatory Visit: Payer: Medicare HMO | Admitting: Sports Medicine

## 2015-02-25 ENCOUNTER — Ambulatory Visit: Payer: Self-pay | Admitting: Pharmacist

## 2015-03-02 ENCOUNTER — Other Ambulatory Visit: Payer: Self-pay | Admitting: Family Medicine

## 2015-03-03 ENCOUNTER — Other Ambulatory Visit: Payer: Self-pay | Admitting: Family Medicine

## 2015-03-09 ENCOUNTER — Ambulatory Visit: Payer: Self-pay | Admitting: Pharmacist

## 2015-03-10 ENCOUNTER — Telehealth: Payer: Self-pay | Admitting: Family Medicine

## 2015-03-10 IMAGING — CR DG CHEST 2V
2 series · 2 of 2 positions shown · non-contrast
Comparison: 04/24/2011

CLINICAL DATA: Allergic reaction, shortness of breath, dizziness,
history asthma, diabetes, hypertension, MI

CHEST - 2 VIEW

[w chest pa]
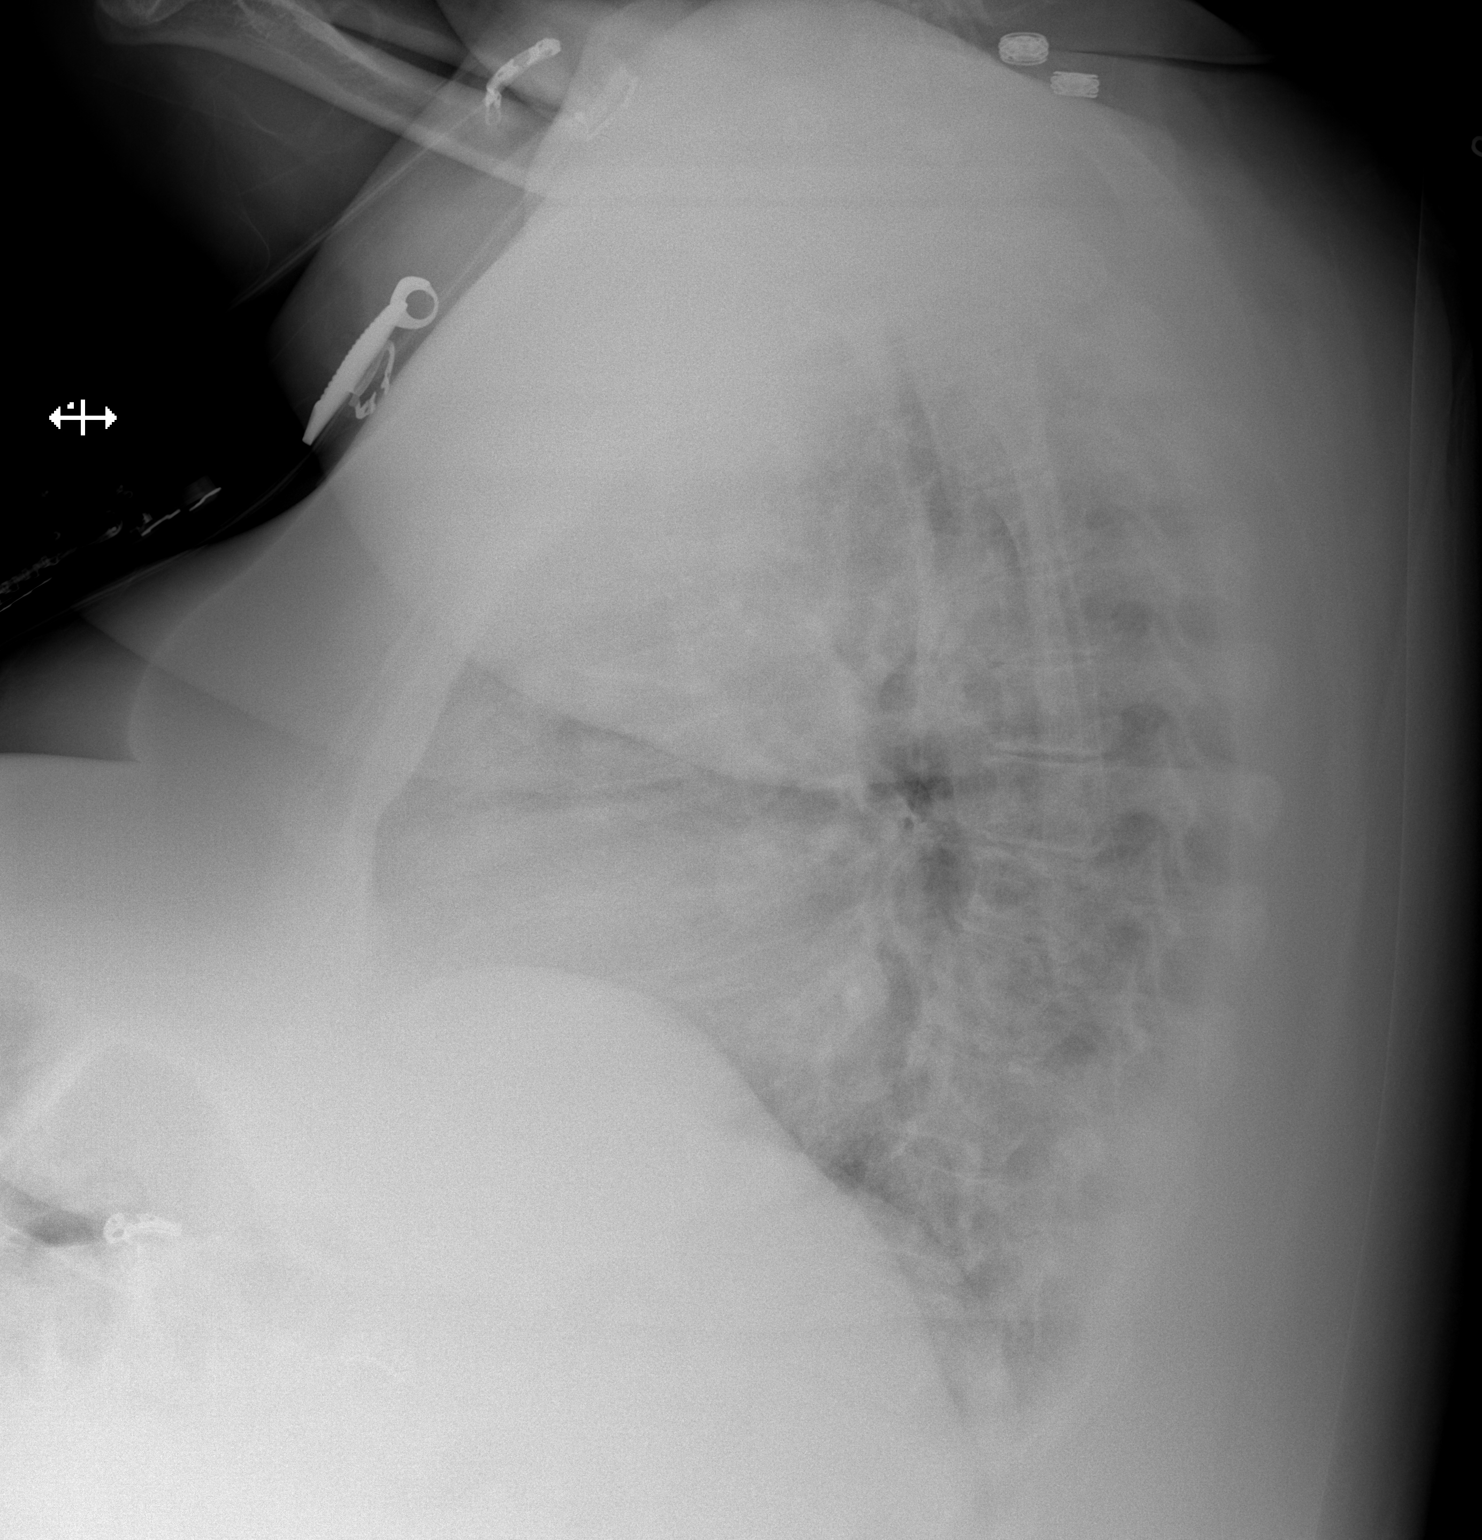

[x chest ap]
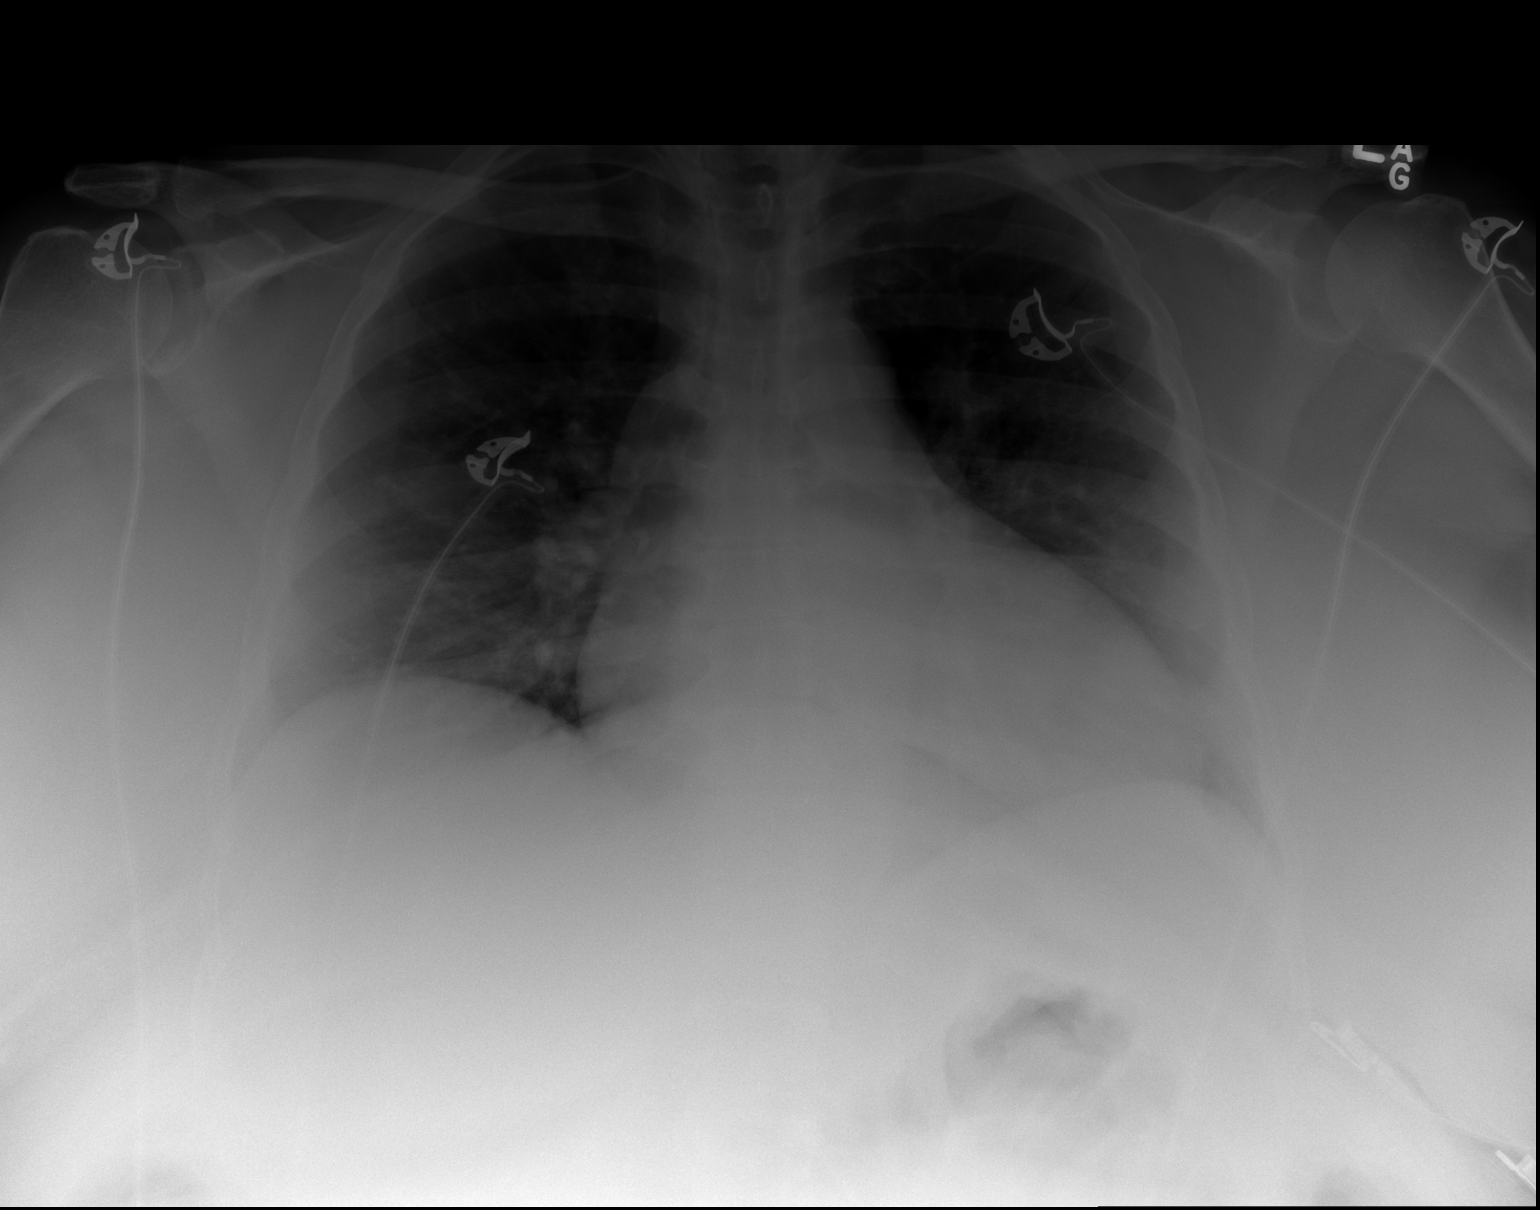

[2 of 2 positions shown; findings below may reference images not displayed]

FINDINGS: Enlargement of cardiac silhouette.
Mediastinal contours and pulmonary vascularity normal.
Slight overpenetration at lung fields on AP view.
No gross infiltrate, pleural effusion or pneumothorax.
Bones unremarkable.
IMPRESSION: Enlargement of cardiac silhouette.
No acute abnormalities.

## 2015-03-10 MED ORDER — MONTELUKAST SODIUM 10 MG PO TABS: 10.0000 mg | ORAL_TABLET | Freq: Every day | ORAL | Status: DC

## 2015-03-10 MED ORDER — POTASSIUM CHLORIDE ER 10 MEQ PO TBCR: 20.0000 meq | EXTENDED_RELEASE_TABLET | Freq: Every day | ORAL | Status: DC

## 2015-03-10 NOTE — Telephone Encounter (Signed)
Pt called and needs a refill on her Potassium and Montelulcast called in. jw

## 2015-03-10 NOTE — Telephone Encounter (Signed)
Rx sent to [facility]. Please have the patient request medication refills from her pharmacy in the future instead of calling into the clinic.  Thanks, Archie Patten, MD [facility] Resident  03/10/2015, 9:46 PM

## 2015-03-12 ENCOUNTER — Other Ambulatory Visit: Payer: Self-pay | Admitting: *Deleted

## 2015-03-12 ENCOUNTER — Other Ambulatory Visit: Payer: Self-pay | Admitting: Family Medicine

## 2015-03-12 ENCOUNTER — Telehealth: Payer: Self-pay | Admitting: *Deleted

## 2015-03-12 MED ORDER — NAPROXEN 500 MG PO TABS: 500.0000 mg | ORAL_TABLET | Freq: Three times a day (TID) | ORAL | Status: DC | PRN

## 2015-03-12 NOTE — Patient Outreach (Signed)
Call received back from member.  She state that she has not been doing well, complaining of congestion and runny nose.  This care manager inquired if she had contacted her PCP office, she state that she is currently at the office to be evaluated.  Advised member to discuss symptoms with PCP, will follow up next week with plan to either close case or send to health coach depending on needs at that time.  [name], BSN, [place] Management  [facility] Care Manager [phone]

## 2015-03-12 NOTE — Telephone Encounter (Signed)
Pt called and needs a refill on her Naproxen called in. jw

## 2015-03-12 NOTE — Patient Outreach (Signed)
Call placed to member to follow up on appointment that was missed at the PCP office to obtain paperwork for new nebulizer machine, with the intention to either close case or send to health coach depending on needs.  No answer, HIPPA compliant voice message left.  Will await call back.  [name], BSN, [place] Management  [facility] Care Manager [phone]

## 2015-03-12 NOTE — Telephone Encounter (Signed)
Patient in nurse clinic for pick up nebulizer machine from [facility].  Patient also requested a DM meter and supplies.  DM standardized form completed and placed in provider box for review.  Derl Barrow, RN

## 2015-03-15 ENCOUNTER — Other Ambulatory Visit: Payer: Self-pay | Admitting: Family Medicine

## 2015-03-15 MED ORDER — VERAPAMIL HCL ER 240 MG PO CP24
480.0000 mg | ORAL_CAPSULE | Freq: Every day | ORAL | Status: DC
Start: 2015-03-15 — End: 2015-04-05

## 2015-03-15 MED ORDER — NAPROXEN 500 MG PO TABS: 500.0000 mg | ORAL_TABLET | Freq: Three times a day (TID) | ORAL | Status: DC | PRN

## 2015-03-15 NOTE — Telephone Encounter (Signed)
Medications refilled. Please have her make a f/u appt with our clinic to see how she's doing on the increased dose of verapamil.  Thanks, Archie Patten, MD [facility] Family Medicine Resident  03/15/2015, 8:10 PM

## 2015-03-15 NOTE — Telephone Encounter (Signed)
Pt called and would like a refill on her Verapamil and Naproxen called in. jw

## 2015-03-17 NOTE — Telephone Encounter (Signed)
Left message on voicemail informing of message below, asked that patient call back to schedule an appointment.

## 2015-03-18 ENCOUNTER — Ambulatory Visit (HOSPITAL_COMMUNITY): Payer: Self-pay | Admitting: Clinical

## 2015-03-19 ENCOUNTER — Other Ambulatory Visit: Payer: Self-pay | Admitting: *Deleted

## 2015-03-19 DIAGNOSIS — I10 Essential (primary) hypertension: Secondary | ICD-10-CM

## 2015-03-19 DIAGNOSIS — J441 Chronic obstructive pulmonary disease with (acute) exacerbation: Secondary | ICD-10-CM

## 2015-03-19 NOTE — Patient Outreach (Addendum)
Call placed to member to follow up on current health status as she complained of chest congestion last week.  She report that she is feeling a lo better since taking antibiotics, and report that she has also started using her nebulizer for breathing treatments.  Discussed with member involvement with health coach for ongoing education regarding COPD, particularly smoking cessation, weight control, and use of CPAP.  Member was reluctant initially, stating that she needed this care manager to come check her blood pressure due to a change in her meds.  Member advised to schedule follow up appointment with her PCP (which she was already advised by the PCP office) and to use her blood pressure monitor at home to monitor and document readings.  She verbalizes understanding and denies any further concerns.  Will place referral to health coach.  [name], BSN, [facility]  [facility] Care Manager [phone]

## 2015-03-22 ENCOUNTER — Ambulatory Visit: Payer: Medicare HMO | Admitting: Sports Medicine

## 2015-04-01 ENCOUNTER — Ambulatory Visit (HOSPITAL_COMMUNITY): Payer: Self-pay | Admitting: Clinical

## 2015-04-05 ENCOUNTER — Other Ambulatory Visit: Payer: Self-pay | Admitting: Family Medicine

## 2015-04-14 ENCOUNTER — Other Ambulatory Visit: Payer: Self-pay | Admitting: Family Medicine

## 2015-04-14 DIAGNOSIS — I10 Essential (primary) hypertension: Secondary | ICD-10-CM

## 2015-04-14 NOTE — Telephone Encounter (Signed)
Pt called and needs a refill on her Metformin, Gabapentin, Lisinopril, and Oxybutynin called in. jw

## 2015-04-15 ENCOUNTER — Ambulatory Visit (HOSPITAL_COMMUNITY): Payer: Self-pay | Admitting: Clinical

## 2015-04-15 MED ORDER — GABAPENTIN 600 MG PO TABS: 600.0000 mg | ORAL_TABLET | Freq: Three times a day (TID) | ORAL | Status: DC

## 2015-04-15 MED ORDER — OXYBUTYNIN CHLORIDE 5 MG PO TABS: 5.0000 mg | ORAL_TABLET | Freq: Three times a day (TID) | ORAL | Status: DC

## 2015-04-15 NOTE — Telephone Encounter (Signed)
Refilled lisinopril, ditropan, gabapentin, and metformin.  Archie Patten, MD [facility] Family Medicine Resident  04/15/2015, 9:01 AM

## 2015-04-19 ENCOUNTER — Ambulatory Visit: Payer: Self-pay | Admitting: *Deleted

## 2015-04-20 ENCOUNTER — Ambulatory Visit: Payer: Medicare HMO | Admitting: Sports Medicine

## 2015-04-22 ENCOUNTER — Other Ambulatory Visit: Payer: Self-pay | Admitting: *Deleted

## 2015-04-22 ENCOUNTER — Ambulatory Visit: Payer: Self-pay | Admitting: *Deleted

## 2015-04-22 NOTE — Patient Outreach (Signed)
[facility]) Care Management  04/22/2015  SARIE BYNOE 1960-09-30 [id number]   RN Health Coach attempted initial assessment outreach call to patient.  Patient was unavailable. HIPPA compliance voicemail message was left with return callback number.    [place] Care Management [phone]

## 2015-04-27 ENCOUNTER — Other Ambulatory Visit (HOSPITAL_COMMUNITY): Payer: Self-pay | Admitting: Psychiatry

## 2015-04-27 ENCOUNTER — Other Ambulatory Visit: Payer: Self-pay | Admitting: Family Medicine

## 2015-04-27 ENCOUNTER — Encounter (HOSPITAL_COMMUNITY): Payer: Self-pay | Admitting: Psychiatry

## 2015-04-27 ENCOUNTER — Ambulatory Visit (INDEPENDENT_AMBULATORY_CARE_PROVIDER_SITE_OTHER): Payer: Medicare HMO | Admitting: Psychiatry

## 2015-04-27 VITALS — BP 142/90 | HR 72 | Ht 64.0 in | Wt 275.4 lb

## 2015-04-27 DIAGNOSIS — F1410 Cocaine abuse, uncomplicated: Secondary | ICD-10-CM

## 2015-04-27 DIAGNOSIS — F419 Anxiety disorder, unspecified: Secondary | ICD-10-CM

## 2015-04-27 DIAGNOSIS — F200 Paranoid schizophrenia: Secondary | ICD-10-CM

## 2015-04-27 MED ORDER — ZIPRASIDONE HCL 80 MG PO CAPS: 80.0000 mg | ORAL_CAPSULE | Freq: Two times a day (BID) | ORAL | Status: DC

## 2015-04-27 MED ORDER — ESCITALOPRAM OXALATE 20 MG PO TABS: 20.0000 mg | ORAL_TABLET | Freq: Every day | ORAL | Status: DC

## 2015-04-27 MED ORDER — MIRTAZAPINE 15 MG PO TABS: 15.0000 mg | ORAL_TABLET | Freq: Every day | ORAL | Status: DC

## 2015-04-27 NOTE — Progress Notes (Signed)
Tiffany [name] [id number] Progress Note  Tiffany [name] [id number] 55 y.o.  04/27/2015 3:29 PM  Chief Complaint:  I have been using cocaine for past 2 months .  I'm under a lot of stress to the family situation.  I need some help.        History of Present Illness:  Tiffany [name] came for her follow-up appointment.  She endorse smoking cocaine for past 2 months.  She mentioned that she is under a lot of stress due to family situation. Her son is been living with her and it has been stressful.  She thought she can handle her stress very well but she could not.  [name] using 2-3 times cocaine which is worth of $200.  She also endorsed lately having restlessness, cough, and increase in smoking.  She tried nicotine patch but it did not help.  Today her blood pressure is slightly increased.  In January she was in the emergency room because of cough and nasal congestion.  She discuss her cocaine use with the therapist and she was recommended [facility] but she could not follow-up.  Today she felt that she need program as she cannot do by herself.  [name] very close to her son and realize that she needed to get better.  She denies any suicidal thoughts or homicidal thought.  Some nights she has difficulty falling asleep.  She endorse racing thoughts paranoia.  [name] taking her medication.  Lately her insurance refused to pay Lexapro.  [name] taking Remeron which she believed helped some anxiety and nervousness.  [name] taking Geodon.  She denies any hallucination or paranoia.  She denies any panic attack.  Her energy level is okay.  She like to continue counseling with Tiffany [name] but now also interested in [facility] IOP.  Patient denies drinking.  Patient denies any active or passive suicidal parts or homicidal thought.  Suicidal Ideation: No Plan Formed: No Patient has means to carry out plan: No  Homicidal Ideation: No Plan Formed: No Patient has means to carry out plan: No Past Psychiatric  History/Hospitalization(s): Patient has nervous breakdown in 3 when her daughter died due to cancer.  She has seen multiple psychiatrist and admitted at least 3-4 times since then.  In the past she remembered taking Abilify, Risperdal but did not provide much detail.  As per chart she has taken Wellbutrin, Xanax in the past.  She was getting Klonopin 1 mg 3 times a day, BuSpar, Geodon 80 mg twice a day, Lexapro 30 mg daily and trazodone 300 mg from [facility] [name].  Patient denies any history of suicidal attempt but endorsed history of suicidal thoughts.  She endorse history of paranoia, delusion, anxiety and manic-like symptoms.  She was seeing psychiatrist at [name].  She was taking Anxiety: Yes Bipolar Disorder: Yes Depression: Yes Mania: Yes Psychosis: Yes Schizophrenia: Yes Personality Disorder: No Hospitalization for psychiatric illness: Yes History of Electroconvulsive Shock Therapy: No Prior Suicide Attempts: No  Medical History; Patient has high blood pressure, diabetes mellitus, hyperlipidemia, stroke, seasonal allergies, sleep apnea and questionable seizures.  Her primary care physician is Tiffany [name].   Review of Systems  Constitutional: Negative for weight loss and malaise/fatigue.  Cardiovascular: Negative for chest pain and palpitations.  Musculoskeletal: Negative.   Neurological: Negative for dizziness, tremors and headaches.  Psychiatric/Behavioral: Positive for substance abuse. The patient is nervous/anxious.     Psychiatric: Agitation: No Hallucination: No Depressed Mood: No Insomnia: No Hypersomnia: No Altered Concentration: No  Feels Worthless: No Grandiose Ideas: No Belief In Special Powers: No New/Increased Substance Abuse: Yes Compulsions: No  Neurologic: Headache: No Seizure: Patient reported seizures but no clear documentation Paresthesias: No   Outpatient Encounter Prescriptions as of 04/27/2015  Medication Sig  . ACCU-CHEK SOFTCLIX LANCETS  lancets   . albuterol (PROVENTIL HFA;VENTOLIN HFA) 108 (90 BASE) MCG/ACT inhaler Inhale 2 puffs into the lungs every 6 (six) hours as needed.  [name] albuterol (PROVENTIL) (2.5 MG/3ML) 0.083% nebulizer solution Take 3 mLs (2.5 mg total) by nebulization every 6 (six) hours as needed for wheezing or shortness of breath. (Patient not taking: Reported on 01/12/2015)  . aspirin 81 MG EC tablet TAKE 1 TABLET (81 MG TOTAL) BY MOUTH DAILY.  [name] atorvastatin (LIPITOR) 40 MG tablet Take 1 tablet (40 mg total) by mouth daily.  . budesonide-formoterol (SYMBICORT) 160-4.5 MCG/ACT inhaler Inhale 2 puffs into the lungs 2 (two) times daily.  . diphenhydrAMINE (BENADRYL) 25 mg capsule Take 50 mg by mouth every 6 (six) hours as needed.  [name] escitalopram (LEXAPRO) 20 MG tablet Take 1 tablet (20 mg total) by mouth daily.  . furosemide (LASIX) 40 MG tablet Take 1 tablet (40 mg total) by mouth daily.  [name] gabapentin (NEURONTIN) 600 MG tablet Take 1 tablet (600 mg total) by mouth 3 (three) times daily.  [name] lisinopril (PRINIVIL,ZESTRIL) 40 MG tablet TAKE 1 TABLET EVERY DAY  . metFORMIN (GLUCOPHAGE-XR) 500 MG 24 hr tablet TAKE 2 TABLETS EVERY DAY  WITH  BREAKFAST  . mirtazapine (REMERON) 15 MG tablet Take 1 tablet (15 mg total) by mouth at bedtime.  . montelukast (SINGULAIR) 10 MG tablet Take 1 tablet (10 mg total) by mouth at bedtime.  . Multiple Vitamins-Minerals (MULTIVITAMIN WITH MINERALS) tablet Take 1 tablet by mouth daily.  . naproxen (NAPROSYN) 500 MG tablet Take 1 tablet (500 mg total) by mouth 3 (three) times daily as needed.  [name] omeprazole (PRILOSEC) 40 MG capsule Take 1 capsule (40 mg total) by mouth daily.  [name] oxybutynin (DITROPAN) 5 MG tablet Take 1 tablet (5 mg total) by mouth 3 (three) times daily.  [name] oxymetazoline (AFRIN) 0.05 % nasal spray Place 1 spray into both nostrils 2 (two) times daily.  . potassium chloride (K-DUR,KLOR-CON) 10 MEQ tablet TAKE 2 TABLETS EVERY DAY  . terbinafine (LAMISIL) 250 MG tablet Take 1  tablet (250 mg total) by mouth daily.  [name] tiotropium (SPIRIVA HANDIHALER) 18 MCG inhalation capsule Place 1 capsule (18 mcg total) into inhaler and inhale daily.  . verapamil (VERELAN PM) 240 MG 24 hr capsule TAKE 2 CAPSULES AT BEDTIME  . ziprasidone (GEODON) 80 MG capsule Take 1 capsule (80 mg total) by mouth 2 (two) times daily with a meal.  . [DISCONTINUED] doxycycline (VIBRAMYCIN) 100 MG capsule Take 1 capsule (100 mg total) by mouth 2 (two) times daily.  . [DISCONTINUED] escitalopram (LEXAPRO) 20 MG tablet Take 1 and 1/2 tab daily  . [DISCONTINUED] fluticasone (FLONASE) 50 MCG/ACT nasal spray Place 2 sprays into both nostrils daily. (Patient not taking: Reported on 02/04/2015)  . [DISCONTINUED] mirtazapine (REMERON) 15 MG tablet Take 1 tablet (15 mg total) by mouth at bedtime.  . [DISCONTINUED] nicotine (NICODERM CQ - DOSED IN MG/24 HOURS) 21 mg/24hr patch Place 1 patch (21 mg total) onto the skin daily. (Patient not taking: Reported on 01/12/2015)  . [DISCONTINUED] potassium chloride (K-DUR) 10 MEQ tablet Take 2 tablets (20 mEq total) by mouth daily.  . [DISCONTINUED] ziprasidone (GEODON) 80 MG capsule Take 1 capsule (80 mg  total) by mouth 2 (two) times daily with a meal.   No facility-administered encounter medications on file as of 04/27/2015.    Recent Results (from the past 2160 hour(s))  Basic metabolic panel     Status: Abnormal   Collection Time: 02/14/15  9:33 AM  Result Value Ref Range   Sodium 141 135 - 145 mmol/L   Potassium 3.7 3.5 - 5.1 mmol/L   Chloride 104 101 - 111 mmol/L   CO2 29 22 - 32 mmol/L   Glucose, Bld 96 65 - 99 mg/dL   BUN 8 6 - 20 mg/dL   Creatinine, Ser 1.10 (H) 0.44 - 1.00 mg/dL   Calcium 8.5 (L) 8.9 - 10.3 mg/dL   GFR calc non Af Amer 55 (L) >60 mL/min   GFR calc Af Amer >60 >60 mL/min    Comment: (NOTE) The eGFR has been calculated using the CKD EPI equation. This calculation has not been validated in all clinical situations. eGFR's persistently <60  mL/min signify possible Chronic Kidney Disease.    Anion gap 8 5 - 15  CBC with Differential     Status: Abnormal   Collection Time: 02/14/15  9:33 AM  Result Value Ref Range   WBC 10.4 4.0 - 10.5 K/uL   RBC 3.95 3.87 - 5.11 MIL/uL   Hemoglobin 11.5 (L) 12.0 - 15.0 g/dL   HCT 37.4 36.0 - 46.0 %   MCV 94.7 78.0 - 100.0 fL   MCH 29.1 26.0 - 34.0 pg   MCHC 30.7 30.0 - 36.0 g/dL   RDW 14.7 11.5 - 15.5 %   Platelets 353 150 - 400 K/uL   Neutrophils Relative % 58 %   Neutro Abs 6.1 1.7 - 7.7 K/uL   Lymphocytes Relative 33 %   Lymphs Abs 3.4 0.7 - 4.0 K/uL   Monocytes Relative 4 %   Monocytes Absolute 0.5 0.1 - 1.0 K/uL   Eosinophils Relative 4 %   Eosinophils Absolute 0.4 0.0 - 0.7 K/uL   Basophils Relative 1 %   Basophils Absolute 0.1 0.0 - 0.1 K/uL      Constitutional:  BP 142/90 mmHg  Pulse 72  Ht '5\' 4"'  (1.626 m)  Wt 275 lb 6.4 oz (124.921 kg)  BMI 47.25 kg/m2   Musculoskeletal: Strength & Muscle Tone: within normal limits Gait & Station: normal Patient leans: N/A  Psychiatric Specialty Exam: General Appearance: Casual and Obese  Eye Contact::  Fair  Speech:  Slow  Volume:  Normal  Mood:  Anxious, Depressed and Euthymic  Affect:  Appropriate  Thought Process:  Goal Directed  Orientation:  Full (Time, Place, and Person)  Thought Content:  WDL  Suicidal Thoughts:  No  Homicidal Thoughts:  No  Memory:  Immediate;   Fair Recent;   Fair Remote;   Fair  Judgement:  Fair  Insight:  Fair  Psychomotor Activity:  Normal  Concentration:  Fair  Recall:  [employer] of Knowledge:  Fair  Language:  Fair  Akathisia:  No  Handed:  Right  AIMS (if indicated):     Assets:  Desire for Improvement Financial Resources/Insurance Housing Social Support  ADL's:  Intact  Cognition:  WNL  Sleep:        Established Problem, Stable/Improving (1), New problem, with additional work up planned, Review of Psycho-Social Stressors (1), Review or order clinical lab tests (1),  Review and summation of old records (2), Established Problem, Worsening (2), New Problem, with no additional work-up planned (3),  Review of Last Therapy Session (1), Review of Medication Regimen & Side Effects (2) and Review of New Medication or Change in Dosage (2)  Assessment: Axis I: Paranoid schizophrenia chronic, bipolar disorder depressed type, anxiety disorder NOS, cocaine abuse  Axis II: Deferred  Axis III:  Past Medical History  Diagnosis Date  . High blood pressure   . Incontinence   . Anxiety   . Manic depression ([place])   . Heart attack ([place])   . Diabetes mellitus     class 2  . Asthma   . Emphysema   . High cholesterol   . Stroke ([place])   . Sinus trouble   . Seasonal allergies   . Sleep apnea   . Disorder of nervous system   . Myocardial infarct ([place])   . Complication of anesthesia     decreased bp, decreased heart rate     Plan:  Patient relapsed into cocaine and has been using 2-3 times a week.  I review her psychosocial stressors.  Her son moved out but patient has been a difficult time stopping cocaine.  Patient will see [facility] counselor today to discuss to start the program.  I will reduce her Lexapro 20 mg as patient insurance refused to pay more than 20 mg.  Continue Remeron 15 mg at bedtime and Geodon 80 mg twice a day.  Discussed medication side effects especially substance use and interaction of his psychiatric medication.  Encouraged to keep appointment with Tiffany [name].  At this time patient does not have any withdrawals from cocaine.  I strongly encouraged to keep appointment with primary care physician for the measurement of [name] needs as patient has mild hypertension but patient has no chest pain, palpitation and dizziness.  Recommended to call us back if she has any question or any concern.  Follow-up in 3 months. Discuss safety plan that anytime having active suicidal thoughts or homicidal thoughts then patient need to call 911 or go to the local emergency  room.   ARFEEN,SYED T., MD 04/27/2015

## 2015-04-29 ENCOUNTER — Other Ambulatory Visit (HOSPITAL_COMMUNITY): Payer: Self-pay | Admitting: Psychiatry

## 2015-04-29 NOTE — Telephone Encounter (Signed)
New script send on 04/27/15 with change dose and directions.

## 2015-05-05 DIAGNOSIS — F17210 Nicotine dependence, cigarettes, uncomplicated: Secondary | ICD-10-CM | POA: Diagnosis not present

## 2015-05-05 DIAGNOSIS — J45909 Unspecified asthma, uncomplicated: Secondary | ICD-10-CM | POA: Diagnosis not present

## 2015-05-05 DIAGNOSIS — Y9289 Other specified places as the place of occurrence of the external cause: Secondary | ICD-10-CM | POA: Diagnosis not present

## 2015-05-05 DIAGNOSIS — Y9389 Activity, other specified: Secondary | ICD-10-CM | POA: Insufficient documentation

## 2015-05-05 DIAGNOSIS — Y998 Other external cause status: Secondary | ICD-10-CM | POA: Diagnosis not present

## 2015-05-05 DIAGNOSIS — E119 Type 2 diabetes mellitus without complications: Secondary | ICD-10-CM | POA: Diagnosis not present

## 2015-05-05 DIAGNOSIS — I252 Old myocardial infarction: Secondary | ICD-10-CM | POA: Diagnosis not present

## 2015-05-05 DIAGNOSIS — S0990XA Unspecified injury of head, initial encounter: Secondary | ICD-10-CM | POA: Insufficient documentation

## 2015-05-06 ENCOUNTER — Emergency Department (HOSPITAL_COMMUNITY)
Admission: EM | Admit: 2015-05-06 | Discharge: 2015-05-06 | Disposition: A | Discharge status: Home / Self Care | Payer: Commercial Managed Care - HMO | Attending: Emergency Medicine | Admitting: Emergency Medicine

## 2015-05-06 ENCOUNTER — Encounter (HOSPITAL_COMMUNITY): Payer: Self-pay | Admitting: Emergency Medicine

## 2015-05-06 NOTE — ED Notes (Signed)
Pt called for room, no answer.
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
## 2015-05-06 NOTE — ED Notes (Signed)
Dr. Roxanne Mins ( EDP) notified on pt.'s elevated blood pressure , no new order received.

## 2015-05-07 ENCOUNTER — Other Ambulatory Visit: Payer: Self-pay | Admitting: *Deleted

## 2015-05-10 MED ORDER — ATORVASTATIN CALCIUM 40 MG PO TABS: 40.0000 mg | ORAL_TABLET | Freq: Every day | ORAL | Status: DC

## 2015-05-12 ENCOUNTER — Ambulatory Visit (INDEPENDENT_AMBULATORY_CARE_PROVIDER_SITE_OTHER): Payer: Medicare HMO | Admitting: Clinical

## 2015-05-12 ENCOUNTER — Encounter (HOSPITAL_COMMUNITY): Payer: Self-pay | Admitting: Clinical

## 2015-05-12 DIAGNOSIS — F250 Schizoaffective disorder, bipolar type: Secondary | ICD-10-CM
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
## 2015-05-14 ENCOUNTER — Other Ambulatory Visit: Payer: Self-pay | Admitting: *Deleted

## 2015-05-14 NOTE — Patient Outreach (Signed)
[facility]) Care Management  [place] Manager  05/25/2015   Tiffany [name] 1960-10-16 [id number]  Subjective: N Health Coach telephone call to patient.  [place] compliance verified  Patient  Was referred for COPD,  Smoking cessation, weight control, and uses a CPAP. RN  Health Coach asked patient if she knew what zones of COPD. Per patient stated," kinda, sorta.  RN Health Coach started describing green zone, yellow zone and red zone.  Patient stated she is in the yellow zone. Per patient she is coughing up a lot of phlegm  and it is gray and she has a rattle in her chest. Patient is unable to breathe out of her nose. Patient stated her nose is always stopped up. Per patient she is using her nebulizer treatments. Patient stated the nose spray doesn't working for her ( afrin).  RN asked patient if she is using her CPAP. Patient stated most of the time.  RN explained the reasons she should be using her CPAP. RN explained that without using the CPAP it puts her at risk for stroke or heart attack. RN explained the mechanism of the way the CPAP works. Patient voiced understanding. RN ask patient if she is still smoking. Patient stated yes 1-1/12 packs a day. Per patient she has been in stop smoking programs and have use the nicotine patch and this hasn't helped.  RN asked patient  about her blood pressure if she has been monitoring it. The last BP note was in the ED and was 183/112. Per patient she has a monitor but has not been taking it in over a week. RN asked patient if she knew how to use the blood pressure monitor and patient stated yes that she just hadn't been taking it.  RN advised patient if she is having a rattle in her chest to call and make appointment. Per patient she doesn't have the $10 co pay. RN instructed patient to call and see what's available. Patient called RN right back and said she couldn't get anything till May 16 th. RN asked patient has this actually gotten worse or  is this her usual level.  Per patient this is the way she usually is.    Objective:   Encounter Medications:  Outpatient Encounter Prescriptions as of 05/14/2015  Medication Sig Note  . ACCU-CHEK SOFTCLIX LANCETS lancets  08/12/2014: Received from: External Pharmacy  . albuterol (PROVENTIL) (2.5 MG/3ML) 0.083% nebulizer solution Take 3 mLs (2.5 mg total) by nebulization every 6 (six) hours as needed for wheezing or shortness of breath.   [name] aspirin 81 MG EC tablet TAKE 1 TABLET (81 MG TOTAL) BY MOUTH DAILY.   [name] atorvastatin (LIPITOR) 40 MG tablet Take 1 tablet (40 mg total) by mouth daily.   . budesonide-formoterol (SYMBICORT) 160-4.5 MCG/ACT inhaler Inhale 2 puffs into the lungs 2 (two) times daily.   . diphenhydrAMINE (BENADRYL) 25 mg capsule Take 50 mg by mouth every 6 (six) hours as needed. 08/17/2014: Member reports taking 50 mg daily   . escitalopram (LEXAPRO) 20 MG tablet Take 1 tablet (20 mg total) by mouth daily.   . furosemide (LASIX) 40 MG tablet Take 1 tablet (40 mg total) by mouth daily.   [name] gabapentin (NEURONTIN) 600 MG tablet Take 1 tablet (600 mg total) by mouth 3 (three) times daily.   [name] lisinopril (PRINIVIL,ZESTRIL) 40 MG tablet TAKE 1 TABLET EVERY DAY   . metFORMIN (GLUCOPHAGE-XR) 500 MG 24 hr tablet TAKE 2 TABLETS EVERY  DAY  WITH  BREAKFAST   . mirtazapine (REMERON) 15 MG tablet Take 1 tablet (15 mg total) by mouth at bedtime.   . montelukast (SINGULAIR) 10 MG tablet Take 1 tablet (10 mg total) by mouth at bedtime.   . Multiple Vitamins-Minerals (MULTIVITAMIN WITH MINERALS) tablet Take 1 tablet by mouth daily.   . naproxen (NAPROSYN) 500 MG tablet Take 1 tablet (500 mg total) by mouth 3 (three) times daily as needed.   [name] omeprazole (PRILOSEC) 40 MG capsule Take 1 capsule (40 mg total) by mouth daily.   [name] oxybutynin (DITROPAN) 5 MG tablet Take 1 tablet (5 mg total) by mouth 3 (three) times daily.   [name] oxymetazoline (AFRIN) 0.05 % nasal spray Place 1 spray into both nostrils 2  (two) times daily.   . potassium chloride (K-DUR,KLOR-CON) 10 MEQ tablet TAKE 2 TABLETS EVERY DAY   . tiotropium (SPIRIVA HANDIHALER) 18 MCG inhalation capsule Place 1 capsule (18 mcg total) into inhaler and inhale daily.   . ziprasidone (GEODON) 80 MG capsule Take 1 capsule (80 mg total) by mouth 2 (two) times daily with a meal.   . [DISCONTINUED] verapamil (VERELAN PM) 240 MG 24 hr capsule TAKE 2 CAPSULES AT BEDTIME   . albuterol (PROVENTIL HFA;VENTOLIN HFA) 108 (90 BASE) MCG/ACT inhaler Inhale 2 puffs into the lungs every 6 (six) hours as needed.   . terbinafine (LAMISIL) 250 MG tablet Take 1 tablet (250 mg total) by mouth daily. (Patient not taking: Reported on 05/14/2015) 05/14/2015: Patient stated she ran out   No facility-administered encounter medications on file as of 05/14/2015.    Functional Status:  In your present state of health, do you have any difficulty performing the following activities: 05/14/2015 10/09/2014  Hearing? N Y  Vision? Y Y  Difficulty concentrating or making decisions? N Y  Walking or climbing stairs? Y Y  Dressing or bathing? Y Y  Doing errands, shopping? N Y  Conservation officer, nature and eating ? N N  Using the Toilet? N N  In the past six months, have you accidently leaked urine? N N  Do you have problems with loss of bowel control? N N  Managing your Medications? N N  Managing your Finances? N Y  Housekeeping or managing your Housekeeping? Y N    Fall/Depression Screening: PHQ 2/9 Scores 05/14/2015 01/20/2015 12/17/2014 12/07/2014 10/20/2014 09/24/2014 08/17/2014  PHQ - 2 Score 1 0 3 0 0 3 2  PHQ- 9 Score - - - - - 16 4   THN CM Care Plan Problem One        Most Recent Value   Care Plan Problem One  Knowledge deficit in self management of COPD   Role Documenting the Problem One  [place] for Problem One  Active   THN Long Term Goal (31-90 days)  Patient will not have any readmissions within the next 90 days   THN Long Term Goal Start Date  05/14/15    Interventions for Problem One [place] discussed with patient the importance of taking medications as per physician order. RN discussed with patient about keeping appointments.   THN CM Short Term Goal #1 (0-30 days)  Patient will be able to verbalize signs and symptoms and action plan for COPD   THN CM Short Term Goal #1 Start Date  05/14/15   Interventions for Short Term Goal #1  RN sent patient a magnet for her refrigerator for COPD. RN  sent educational material on COPD exacerbation.RN will follow up with teachback and discussion   THN CM Short Term Goal #2 (0-30 days)  Patient will report using her cpap as ordered within the next 30 days   THN CM Short Term Goal #2 Start Date  05/14/15   Interventions for Short Term Goal #2  RN discussed with patient the using of her cpap and why she needs to use it every night. RN will send educational material on cpap usage   THN CM Short Term Goal #3 (0-30 days)  Patient will report continuing the stop smoking cessation program within the next 30 days   THN CM Short Term Goal #3 Start Date  05/14/15   Interventions for Short Tern Goal #3  Patient states she is in a program to help her stop smoking. RN will follow up to make sure patient is still in the program   THN CM Short Term Goal #4 (0-30 days)  Patient will report monitoring her blood pressure and documenting within the next 30 days   THN CM Short Term Goal #4 Start Date  05/14/15   Interventions for Short Term Goal #4  RN discussed with patient how high her blood pressure has been running. RN will send some educational material on hypertention. RN will follow up with patient on taking and documenting pressures      Assessment:  Patient is non compliant in monitoring her blood pressure Patient is still smoking Patient is not using her cpap regularly Patient will benefit from [place] telephonic outreach for education and support for COPD self management.  Plan: RN  sent patient educational material on CPAP RN sent patient educational material on COPD exacerbation RN sent patient educational material on Hypertension RN sent patient education material on purse-lip breathing RN will follow up with patient on adhering to MD Appointment RN will follow up within 30 days for discussion and teach back Rn will send physician an initial barriers letter and assessment and care plan RN will follow up with patient on adhering to smoking cessation program with Nicholas Lose, PharmD at [facility] [phone]

## 2015-05-24 ENCOUNTER — Other Ambulatory Visit: Payer: Self-pay | Admitting: Family Medicine

## 2015-05-24 ENCOUNTER — Ambulatory Visit: Payer: Medicare HMO | Admitting: Sports Medicine

## 2015-05-25 ENCOUNTER — Other Ambulatory Visit: Payer: Self-pay | Admitting: Family Medicine

## 2015-05-26 ENCOUNTER — Telehealth (HOSPITAL_COMMUNITY): Payer: Self-pay

## 2015-05-26 ENCOUNTER — Ambulatory Visit (INDEPENDENT_AMBULATORY_CARE_PROVIDER_SITE_OTHER): Payer: Medicare HMO | Admitting: Clinical

## 2015-05-26 ENCOUNTER — Encounter: Payer: Self-pay | Admitting: *Deleted

## 2015-05-26 DIAGNOSIS — F1990 Other psychoactive substance use, unspecified, uncomplicated: Secondary | ICD-10-CM

## 2015-05-26 DIAGNOSIS — F250 Schizoaffective disorder, bipolar type: Secondary | ICD-10-CM | POA: Diagnosis not present

## 2015-05-26 NOTE — Telephone Encounter (Signed)
Patient was here to see he counselor today and stopped by my office. She states that she is not sleeping well at all since reducing the Remeron to 15 mg, she would like to increase back up to 30 mg, please review and advise, thank you

## 2015-05-26 NOTE — Telephone Encounter (Signed)
She should do CDI program.

## 2015-05-27 NOTE — Telephone Encounter (Signed)
I called the patient and tried to leave a voicemail, but the box was full. I gave the patient info to [facility] for Tiffany [name].

## 2015-05-30 ENCOUNTER — Encounter (HOSPITAL_COMMUNITY): Payer: Self-pay | Admitting: Clinical

## 2015-05-30 NOTE — Progress Notes (Signed)
   THERAPIST PROGRESS NOTE  Session Time: 2:30 -3:25  Participation Level: Active  Behavioral Response: CasualAlertDepressed  Type of Therapy: Individual Therapy  Treatment Goals addressed: Improve psychiatric symptoms  Interventions: Motivational Interviewing  Summary: LOLITHA TORTORA is a 55 y.o. female who presents with schizoaffective disorder, bipolar type and substance use disorder.   Suicidal/Homicidal: Nowithout intent/plan  Therapist Response: Sheray met with clinician for an individual session. She shared that she had not yet started IOP. She shared that she had been having increased depression and paranoia due to relapsing. Kippy first relapsed in Dec after years of sobriety. She thought at first she would be able to stop on her own. She now relapses for 1-2 days every 3-4 weeks - spending about $300 on powder cocaine. Client and clinician updated her assessment to reflect this. Client and clinician discussed her relapses and their affect on her psychiatric symptoms and relationships. Client and clinician discussed some steps she could take to improve both her psychiatric and her substance use disorder. Sevannah  Agreed to follow through with attending IOP.   Plan: Return again in 1-2 weeks.  Diagnosis: Axis I: schizoaffective disorder, bipolar type and substance use disorder     Dorcas Melito A, LCSW 05/26/2015
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
## 2015-06-01 ENCOUNTER — Encounter: Payer: Self-pay | Admitting: Family Medicine

## 2015-06-01 ENCOUNTER — Ambulatory Visit (INDEPENDENT_AMBULATORY_CARE_PROVIDER_SITE_OTHER): Payer: Commercial Managed Care - HMO | Admitting: Family Medicine

## 2015-06-01 VITALS — BP 142/84 | HR 73 | Temp 98.1°F | Ht 64.0 in | Wt 262.4 lb

## 2015-06-01 DIAGNOSIS — Z1159 Encounter for screening for other viral diseases: Secondary | ICD-10-CM | POA: Diagnosis not present

## 2015-06-01 DIAGNOSIS — I10 Essential (primary) hypertension: Secondary | ICD-10-CM | POA: Diagnosis not present

## 2015-06-01 DIAGNOSIS — E118 Type 2 diabetes mellitus with unspecified complications: Secondary | ICD-10-CM | POA: Diagnosis not present

## 2015-06-01 DIAGNOSIS — Z114 Encounter for screening for human immunodeficiency virus [HIV]: Secondary | ICD-10-CM | POA: Diagnosis not present

## 2015-06-01 DIAGNOSIS — B86 Scabies: Secondary | ICD-10-CM

## 2015-06-01 LAB — POCT GLYCOSYLATED HEMOGLOBIN (HGB A1C): Hemoglobin A1C: 5.6

## 2015-06-01 MED ORDER — AMLODIPINE BESYLATE 5 MG PO TABS: 10.0000 mg | ORAL_TABLET | Freq: Every day | ORAL | Status: DC

## 2015-06-01 MED ORDER — FLUTICASONE FUROATE-VILANTEROL 100-25 MCG/INH IN AEPB: 1.0000 | INHALATION_SPRAY | Freq: Every day | RESPIRATORY_TRACT | Status: DC

## 2015-06-01 MED ORDER — PERMETHRIN 5 % EX CREA: 1.0000 "application " | TOPICAL_CREAM | Freq: Once | CUTANEOUS | Status: DC

## 2015-06-01 NOTE — Assessment & Plan Note (Signed)
A1c at goal today.

## 2015-06-01 NOTE — Assessment & Plan Note (Signed)
Not at goal. Patient reports compliance. She does not wish to implement any lifestyle changes (close to goal).  - continue verapamil and lisinopril  - adding amlodipine 5mg  daily. - follow up in 1 month.

## 2015-06-01 NOTE — Assessment & Plan Note (Addendum)
Not controlled. Patient reports compliance however notes from her [facility] care manager suggest she may need further education. She can tell me how she's taking meds. We discussed that smoking cessation was the best thing for her future, she does not seem interested at this time. Does not seem to be in an acute exacerbation- no coughing or increased sputum production. - continue spiriva and albuterol PRN - change Symbicort to Breo ellipta to see if there is more improvement - pt to discuss Chantix with Dr. Marchia Bond with behavioral health.

## 2015-06-01 NOTE — Assessment & Plan Note (Signed)
Exam and history consistent with scabies. Rx for permethrin cream to be applied topically from head to soles of the feet at bedtime and to be washed off the subsequent day (8-14hrs later).

## 2015-06-01 NOTE — Progress Notes (Signed)
Patient ID: Tiffany [name], female   DOB: 24-Jun-1960, 55 y.o.   MRN: [id number]    Subjective: CC: f/u COPD HPI: Patient is a 55 y.o. female with a past medical history of HTN, COPD, asthma, DM, HLD, depression, bipolar 1, and seizures presenting to clinic today for f/u on HTN and great toe pain.  COPD: doing "horribly." she's taking symbicort BID, Spriva BID (prescribed daily). Doing albuterol 4x day over the last month. Notes significant SOB and wheezing.  She thinks smoking is making is making it worse. Smokes 1ppd. Not interested in quitting at this time. No cough or increased sputum production. She used to be on O2 but was able to get off it.   Bumps on arms: noticed between her fingers for the last few months, also on her back and arms. +pruritus. No new detergents, lotions, body washes. Grease didn't help. Her son has a similar thing and was prescribed a lotion in the ED, can't recall what it was.    Hypertension Blood pressure at home: 150s/80s Blood pressure today: 142/84 Meds: Compliant with lisinopril 75m daily and verapamil [id number]; no missed doses.  Side effects: None ROS: Denies headache, dizziness, visual changes, nausea, vomiting, chest pain, abdominal pain.  Social History: current every day smoker  Health Maintenance: testing for hep C and HIV today, will need to f/u for TDap, discussion on mammogram and colonoscopy.    ROS: All other systems reviewed and are negative besides that in HPI.  Past Medical History Patient Active Problem List   Diagnosis Date Noted  . Scabies 06/01/2015  . Onychomycosis 01/21/2015  . Nasal congestion 12/17/2014  . Type 2 diabetes mellitus with complication (HCTaylor  . Tobacco abuse 07/22/2014  . COPD (chronic obstructive pulmonary disease) (HCCuba06/22/2016  . COPD exacerbation (HC[facility]roy[facility]ates06/10/2014  . Seborrheic keratoses 12/31/2013  . Thrush 09/19/2013  . Knee pain, bilateral 01/22/2013  . Seizure (HCMikes12/20/2014  . Health care  maintenance 11/25/2012  . Folliculitis 1174/12/8784. History of kidney stones 06/18/2012  . Chronic pain syndrome 06/18/2012  . Muscle spasm 06/18/2012  . Elevated serum creatinine 04/28/2012  . Asthma with acute exacerbation 04/28/2012  . Allergic reaction 04/07/2012  . Bipolar 1 disorder (HCNew Strawn11/22/2013  . Itching 09/06/2011  . HSV infection 08/30/2011  . Dyslipidemia 04/24/2011  . Anemia 04/24/2011  . Depression with anxiety 04/24/2011  . Diabetic neuropathy (HCWabaunsee04/08/2011  . OSA on CPAP 10/18/2010  . Asthma 10/18/2010  . Obesity 10/18/2010  . HTN (hypertension) 10/18/2010  . Type 2 diabetes mellitus (HCBay Harbor Islands10/02/2010    Medications- reviewed and updated Current Outpatient Prescriptions  Medication Sig Dispense Refill  . ACCU-CHEK SOFTCLIX LANCETS lancets     . albuterol (PROVENTIL HFA;VENTOLIN HFA) 108 (90 BASE) MCG/ACT inhaler Inhale 2 puffs into the lungs every 6 (six) hours as needed. 1 Inhaler 6  . albuterol (PROVENTIL) (2.5 MG/3ML) 0.083% nebulizer solution Take 3 mLs (2.5 mg total) by nebulization every 6 (six) hours as needed for wheezing or shortness of breath. 150 mL 1  . amLODipine (NORVASC) 5 MG tablet Take 2 tablets (10 mg total) by mouth daily. 30 tablet 2  . aspirin 81 MG EC tablet TAKE 1 TABLET (81 MG TOTAL) BY MOUTH DAILY. 30 tablet 0  . atorvastatin (LIPITOR) 40 MG tablet Take 1 tablet (40 mg total) by mouth daily. 90 tablet 4  . Blood Glucose Monitoring Suppl (ACCU-CHEK AVIVA PLUS) w/Device KIT TEST FOUR TIMES DAILY 1 kit 0  .  diphenhydrAMINE (BENADRYL) 25 mg capsule Take 50 mg by mouth every 6 (six) hours as needed.    [name] escitalopram (LEXAPRO) 20 MG tablet Take 1 tablet (20 mg total) by mouth daily. 90 tablet 0  . fluticasone furoate-vilanterol (BREO ELLIPTA) 100-25 MCG/INH AEPB Inhale 1 puff into the lungs daily. 28 each 2  . furosemide (LASIX) 40 MG tablet Take 1 tablet (40 mg total) by mouth daily. 90 tablet 3  . gabapentin (NEURONTIN) 600 MG tablet  Take 1 tablet (600 mg total) by mouth 3 (three) times daily. 270 tablet 4  . lisinopril (PRINIVIL,ZESTRIL) 40 MG tablet TAKE 1 TABLET EVERY DAY 90 tablet 0  . metFORMIN (GLUCOPHAGE-XR) 500 MG 24 hr tablet TAKE 2 TABLETS EVERY DAY  WITH  BREAKFAST 180 tablet 3  . mirtazapine (REMERON) 15 MG tablet Take 1 tablet (15 mg total) by mouth at bedtime. 90 tablet 0  . montelukast (SINGULAIR) 10 MG tablet Take 1 tablet (10 mg total) by mouth at bedtime. 90 tablet 3  . Multiple Vitamins-Minerals (MULTIVITAMIN WITH MINERALS) tablet Take 1 tablet by mouth daily.    . naproxen (NAPROSYN) 500 MG tablet Take 1 tablet (500 mg total) by mouth 3 (three) times daily as needed. 270 tablet 0  . omeprazole (PRILOSEC) 40 MG capsule Take 1 capsule (40 mg total) by mouth daily. 90 capsule 1  . oxybutynin (DITROPAN) 5 MG tablet Take 1 tablet (5 mg total) by mouth 3 (three) times daily. 270 tablet 3  . oxymetazoline (AFRIN) 0.05 % nasal spray Place 1 spray into both nostrils 2 (two) times daily.    . permethrin (ACTICIN) 5 % cream Apply 1 application topically once. Apply from head to soles of feet at bedtime, then wash off 8-14 hours after 60 g 0  . potassium chloride (K-DUR,KLOR-CON) 10 MEQ tablet TAKE 2 TABLETS EVERY DAY 180 tablet 2  . terbinafine (LAMISIL) 250 MG tablet Take 1 tablet (250 mg total) by mouth daily. (Patient not taking: Reported on 05/14/2015) 30 tablet 1  . tiotropium (SPIRIVA HANDIHALER) 18 MCG inhalation capsule Place 1 capsule (18 mcg total) into inhaler and inhale daily. 90 capsule 4  . verapamil (VERELAN PM) 240 MG 24 hr capsule TAKE 2 CAPSULES AT BEDTIME 60 capsule 0  . ziprasidone (GEODON) 80 MG capsule Take 1 capsule (80 mg total) by mouth 2 (two) times daily with a meal. 180 capsule 0   No current facility-administered medications for this visit.    Objective: Office vital signs reviewed. BP 142/84 mmHg  Pulse 73  Temp(Src) 98.1 F (36.7 C) (Oral)  Ht '5\' 4"'  (1.626 m)  Wt 262 lb 6.4 oz  (119.024 kg)  BMI 45.02 kg/m2   Physical Examination:  General: Awake, alert, well- nourished, NAD Cardio: RRR, no m/r/g noted. No pitting edema noted.  Pulm: No increased WOB.  CTAB, wheezes in the lower bases bilaterally. No crackles or rhonchi.  Skin: punctate hyperpigmented lesions between the fingers, over the arms and in the gluteal fold with some excoriations. No super imposed infection/drainage.  Assessment/Plan: HTN (hypertension) Not at goal. Patient reports compliance. She does not wish to implement any lifestyle changes (close to goal).  - continue verapamil and lisinopril  - adding amlodipine 3m daily. - follow up in 1 month.   COPD (chronic obstructive pulmonary disease) (HHoratio Not controlled. Patient reports compliance however [facility] suggest she may need further education. She can tell me how she's taking meds. We discussed that smoking cessation  was the best thing for her future, she does not seem interested at this time. Does not seem to be in an acute exacerbation- no coughing or increased sputum production. - continue spiriva and albuterol PRN - change Symbicort to Breo ellipta to see if there is more improvement - pt to discuss Chantix with Dr. Marchia Bond with behavioral health.  Type 2 diabetes mellitus A1c at goal today.  Scabies Exam and history consistent with scabies. Rx for permethrin cream to be applied topically from head to soles of the feet at bedtime and to be washed off the subsequent day (8-14hrs later).      Orders Placed This Encounter  Procedures  . Hepatitis C antibody  . HIV antibody (with reflex)  . HgB A1c    Meds ordered this encounter  Medications  . fluticasone furoate-vilanterol (BREO ELLIPTA) 100-25 MCG/INH AEPB    Sig: Inhale 1 puff into the lungs daily.    Dispense:  28 each    Refill:  2  . amLODipine (NORVASC) 5 MG tablet    Sig: Take 2 tablets (10 mg total) by mouth daily.    Dispense:  30 tablet     Refill:  2  . permethrin (ACTICIN) 5 % cream    Sig: Apply 1 application topically once. Apply from head to soles of feet at bedtime, then wash off 8-14 hours after    Dispense:  60 g    Refill:  [place] PGY-2, [place]

## 2015-06-01 NOTE — Patient Instructions (Signed)
I have sent a new inhaler to your mail pharmacy. Once you get it START the BREO inhaler once daily, STOP the Symbicort  I have sent a new blood pressure medication as well called amlodipine, take it once daily.  Ask Dr. Marchia Bond about starting Chantix to help stop smoking.  I have sent a cream for the scabies rash. Apply it at bedtime, wash it off in the morning after it had been on from 8-14 hours.  Permethrin skin cream What is this medicine? PERMETHRIN (per METH rin) skin cream is used to treat scabies. This medicine may be used for other purposes; ask your health care provider or pharmacist if you have questions. What should I tell my health care provider before I take this medicine? They need to know if you have any of these conditions: -asthma -an unusual or allergic reaction to permethrin, veterinary or household insecticides, other medicines, chrysanthemums, foods, dyes, or preservatives -pregnant or trying to get pregnant -breast-feeding How should I use this medicine? This medicine is for external use only. Do not take by mouth. Follow the directions on the prescription label. A bath or shower is NOT recommended before applying this medicine. Thoroughly rub the cream into all skin surfaces, from your head to the soles of your feet. It is important to apply it everywhere on your body, not just where the rash is. Apply the cream between fingers and toe creases, in the folds of the wrist and waistline, in the cleft of the buttocks, on the genitals, and in the belly button. Use a toothpick to apply the cream beneath your fingernails and toenails. Nails should be cut short. If you have little or no hair, or you are applying the cream to an infant or young child, make sure you rub the cream into the neck, scalp, hairline, temples, and forehead. Leave it on for 8 to 14 hours, then remove it by bathing and shampooing. If you are applying this medicine to another person, wear plastic or disposable  gloves to protect yourself from infestation. Do not get this medicine in your eyes. If you do, rinse out with plenty of cool tap water. Talk to your pediatrician regarding the use of this medicine in children. While this drug may be prescribed for children as young as 51 months of age for selected conditions, precautions do apply. Overdosage: If you think you have taken too much of this medicine contact a poison control center or emergency room at once. NOTE: This medicine is only for you. Do not share this medicine with others. What if I miss a dose? This does not apply. What may interact with this medicine? Interactions are not expected. Do not use any other skin products on the affected area without telling your doctor or health care professional. This list may not describe all possible interactions. Give your health care provider a list of all the medicines, herbs, non-prescription drugs, or dietary supplements you use. Also tell them if you smoke, drink alcohol, or use illegal drugs. Some items may interact with your medicine. What should I watch for while using this medicine? It is not unusual for itching and rash to continue for as long as 2 to 4 weeks after treatment. These symptoms may be a temporary reaction to the remains of the mites. This does not mean this cream did not work or that it needs to be reapplied. If you feel that the itching and rash is intense or if it continues beyond 4 weeks, talk  to your doctor or health care professional right away. Scabies is spread by direct skin contact with an infected person. Family members and sexual partners may require treatment with this medicine. You should discuss this with your doctor or health care professional. Using a normal washing cycle, you should wash all clothing, towels and bed linen that has touched your skin. You do not need to rewash clean clothing that has not yet been worn. Coats, furniture, rugs, floors, and walls do not need to be  cleaned in any special manner. What side effects may I notice from receiving this medicine? Side effects that usually do not require medical attention (report to your doctor or health care professional if they continue or are bothersome): -itching -numbness -rash -redness or mild swelling of the skin -stinging or burning -tingling sensation This list may not describe all possible side effects. Call your doctor for medical advice about side effects. You may report side effects to FDA at 1-800-FDA-1088. Where should I keep my medicine? Keep out of the reach of children. Store at room temperature away from heat and direct light. Do not refrigerate or freeze. Throw away any unused medicine after the expiration date. NOTE: This sheet is a summary. It may not cover all possible information. If you have questions about this medicine, talk to your doctor, pharmacist, or health care provider.    2016, Elsevier/Gold Standard. (2007-08-01 14:02:14)

## 2015-06-02 LAB — HEPATITIS C ANTIBODY: HCV Ab: NEGATIVE

## 2015-06-02 LAB — HIV ANTIBODY (ROUTINE TESTING W REFLEX): HIV 1&2 Ab, 4th Generation: NONREACTIVE

## 2015-06-07 ENCOUNTER — Encounter: Payer: Self-pay | Admitting: Family Medicine

## 2015-06-09 ENCOUNTER — Ambulatory Visit (HOSPITAL_COMMUNITY): Payer: Self-pay | Admitting: Clinical

## 2015-06-11 ENCOUNTER — Ambulatory Visit: Payer: Self-pay | Admitting: *Deleted

## 2015-06-19 ENCOUNTER — Other Ambulatory Visit: Payer: Self-pay | Admitting: Family Medicine

## 2015-06-21 ENCOUNTER — Encounter (HOSPITAL_COMMUNITY): Payer: Self-pay | Admitting: Emergency Medicine

## 2015-06-21 ENCOUNTER — Inpatient Hospital Stay (HOSPITAL_COMMUNITY)
Admission: EM | Admit: 2015-06-21 | Discharge: 2015-06-25 | DRG: 190 | Disposition: A | Discharge status: Home Health | Payer: Commercial Managed Care - HMO | Attending: Pulmonary Disease | Admitting: Pulmonary Disease

## 2015-06-21 ENCOUNTER — Emergency Department (HOSPITAL_COMMUNITY): Payer: Commercial Managed Care - HMO

## 2015-06-21 DIAGNOSIS — J9600 Acute respiratory failure, unspecified whether with hypoxia or hypercapnia: Secondary | ICD-10-CM | POA: Diagnosis not present

## 2015-06-21 DIAGNOSIS — K219 Gastro-esophageal reflux disease without esophagitis: Secondary | ICD-10-CM | POA: Diagnosis present

## 2015-06-21 DIAGNOSIS — E669 Obesity, unspecified: Secondary | ICD-10-CM | POA: Diagnosis present

## 2015-06-21 DIAGNOSIS — J9601 Acute respiratory failure with hypoxia: Secondary | ICD-10-CM | POA: Diagnosis not present

## 2015-06-21 DIAGNOSIS — J189 Pneumonia, unspecified organism: Secondary | ICD-10-CM | POA: Diagnosis present

## 2015-06-21 DIAGNOSIS — E1122 Type 2 diabetes mellitus with diabetic chronic kidney disease: Secondary | ICD-10-CM | POA: Diagnosis present

## 2015-06-21 DIAGNOSIS — J9611 Chronic respiratory failure with hypoxia: Secondary | ICD-10-CM | POA: Diagnosis present

## 2015-06-21 DIAGNOSIS — R062 Wheezing: Secondary | ICD-10-CM | POA: Diagnosis not present

## 2015-06-21 DIAGNOSIS — F17210 Nicotine dependence, cigarettes, uncomplicated: Secondary | ICD-10-CM | POA: Diagnosis present

## 2015-06-21 DIAGNOSIS — I5032 Chronic diastolic (congestive) heart failure: Secondary | ICD-10-CM | POA: Diagnosis present

## 2015-06-21 DIAGNOSIS — Z7984 Long term (current) use of oral hypoglycemic drugs: Secondary | ICD-10-CM

## 2015-06-21 DIAGNOSIS — J9622 Acute and chronic respiratory failure with hypercapnia: Secondary | ICD-10-CM | POA: Diagnosis present

## 2015-06-21 DIAGNOSIS — E860 Dehydration: Secondary | ICD-10-CM | POA: Diagnosis present

## 2015-06-21 DIAGNOSIS — N189 Chronic kidney disease, unspecified: Secondary | ICD-10-CM | POA: Diagnosis present

## 2015-06-21 DIAGNOSIS — F319 Bipolar disorder, unspecified: Secondary | ICD-10-CM | POA: Diagnosis present

## 2015-06-21 DIAGNOSIS — G894 Chronic pain syndrome: Secondary | ICD-10-CM | POA: Diagnosis present

## 2015-06-21 DIAGNOSIS — I130 Hypertensive heart and chronic kidney disease with heart failure and stage 1 through stage 4 chronic kidney disease, or unspecified chronic kidney disease: Secondary | ICD-10-CM | POA: Diagnosis present

## 2015-06-21 DIAGNOSIS — E872 Acidosis: Secondary | ICD-10-CM | POA: Diagnosis present

## 2015-06-21 DIAGNOSIS — Z79899 Other long term (current) drug therapy: Secondary | ICD-10-CM

## 2015-06-21 DIAGNOSIS — J441 Chronic obstructive pulmonary disease with (acute) exacerbation: Secondary | ICD-10-CM | POA: Diagnosis present

## 2015-06-21 DIAGNOSIS — E7800 Pure hypercholesterolemia, unspecified: Secondary | ICD-10-CM | POA: Diagnosis present

## 2015-06-21 DIAGNOSIS — E875 Hyperkalemia: Secondary | ICD-10-CM | POA: Diagnosis not present

## 2015-06-21 DIAGNOSIS — N179 Acute kidney failure, unspecified: Secondary | ICD-10-CM | POA: Diagnosis present

## 2015-06-21 DIAGNOSIS — Z7982 Long term (current) use of aspirin: Secondary | ICD-10-CM | POA: Diagnosis not present

## 2015-06-21 DIAGNOSIS — E1140 Type 2 diabetes mellitus with diabetic neuropathy, unspecified: Secondary | ICD-10-CM | POA: Diagnosis present

## 2015-06-21 DIAGNOSIS — A000 Cholera due to Vibrio cholerae 01, biovar cholerae: Secondary | ICD-10-CM | POA: Diagnosis not present

## 2015-06-21 DIAGNOSIS — J440 Chronic obstructive pulmonary disease with acute lower respiratory infection: Secondary | ICD-10-CM | POA: Diagnosis not present

## 2015-06-21 DIAGNOSIS — R0602 Shortness of breath: Secondary | ICD-10-CM | POA: Diagnosis not present

## 2015-06-21 DIAGNOSIS — Z8673 Personal history of transient ischemic attack (TIA), and cerebral infarction without residual deficits: Secondary | ICD-10-CM | POA: Diagnosis not present

## 2015-06-21 DIAGNOSIS — J9621 Acute and chronic respiratory failure with hypoxia: Secondary | ICD-10-CM | POA: Diagnosis present

## 2015-06-21 DIAGNOSIS — G4733 Obstructive sleep apnea (adult) (pediatric): Secondary | ICD-10-CM | POA: Diagnosis present

## 2015-06-21 DIAGNOSIS — D649 Anemia, unspecified: Secondary | ICD-10-CM | POA: Diagnosis present

## 2015-06-21 DIAGNOSIS — Z6841 Body Mass Index (BMI) 40.0 and over, adult: Secondary | ICD-10-CM

## 2015-06-21 DIAGNOSIS — I252 Old myocardial infarction: Secondary | ICD-10-CM

## 2015-06-21 DIAGNOSIS — I2510 Atherosclerotic heart disease of native coronary artery without angina pectoris: Secondary | ICD-10-CM | POA: Diagnosis present

## 2015-06-21 DIAGNOSIS — E119 Type 2 diabetes mellitus without complications: Secondary | ICD-10-CM | POA: Diagnosis not present

## 2015-06-21 DIAGNOSIS — J45909 Unspecified asthma, uncomplicated: Secondary | ICD-10-CM | POA: Diagnosis not present

## 2015-06-21 DIAGNOSIS — R05 Cough: Secondary | ICD-10-CM | POA: Diagnosis not present

## 2015-06-21 DIAGNOSIS — J9602 Acute respiratory failure with hypercapnia: Secondary | ICD-10-CM | POA: Diagnosis present

## 2015-06-21 DIAGNOSIS — J9612 Chronic respiratory failure with hypercapnia: Secondary | ICD-10-CM

## 2015-06-21 DIAGNOSIS — E118 Type 2 diabetes mellitus with unspecified complications: Secondary | ICD-10-CM

## 2015-06-21 DIAGNOSIS — J9620 Acute and chronic respiratory failure, unspecified whether with hypoxia or hypercapnia: Secondary | ICD-10-CM | POA: Diagnosis not present

## 2015-06-21 HISTORY — DX: Unspecified convulsions: R56.9

## 2015-06-21 HISTORY — DX: Pneumonia, unspecified organism: J18.9

## 2015-06-21 LAB — BASIC METABOLIC PANEL
Anion gap: 7 (ref 5–15)
BUN: 17 mg/dL (ref 6–20)
CO2: 26 mmol/L (ref 22–32)
Calcium: 9 mg/dL (ref 8.9–10.3)
Chloride: 105 mmol/L (ref 101–111)
Creatinine, Ser: 1.61 mg/dL — ABNORMAL HIGH (ref 0.44–1.00)
GFR calc Af Amer: 40 mL/min — ABNORMAL LOW (ref >60)
GFR calc non Af Amer: 35 mL/min — ABNORMAL LOW (ref >60)
Glucose, Bld: 99 mg/dL (ref 65–99)
Potassium: 5.2 mmol/L — ABNORMAL HIGH (ref 3.5–5.1)
Sodium: 138 mmol/L (ref 135–145)

## 2015-06-21 LAB — I-STAT VENOUS BLOOD GAS, ED
Acid-base deficit: 5 mmol/L — ABNORMAL HIGH (ref 0.0–2.0)
Bicarbonate: 23.4 mEq/L (ref 20.0–24.0)
O2 Saturation: 92 %
Patient temperature: 98.6
TCO2: 25 mmol/L (ref 0–100)
pCO2, Ven: 60.1 mmHg — ABNORMAL HIGH (ref 45.0–50.0)
pH, Ven: 7.198 — CL (ref 7.250–7.300)
pO2, Ven: 80 mmHg — ABNORMAL HIGH (ref 31.0–45.0)

## 2015-06-21 LAB — I-STAT ARTERIAL BLOOD GAS, ED
Acid-base deficit: 3 mmol/L — ABNORMAL HIGH (ref 0.0–2.0)
Bicarbonate: 24.9 mEq/L — ABNORMAL HIGH (ref 20.0–24.0)
O2 Saturation: 88 %
Patient temperature: 98.6
TCO2: 27 mmol/L (ref 0–100)
pCO2 arterial: 59.7 mmHg (ref 35.0–45.0)
pH, Arterial: 7.229 — ABNORMAL LOW (ref 7.350–7.450)
pO2, Arterial: 67 mmHg — ABNORMAL LOW (ref 80.0–100.0)

## 2015-06-21 LAB — CBC
HCT: 35.8 % — ABNORMAL LOW (ref 36.0–46.0)
Hemoglobin: 10.8 g/dL — ABNORMAL LOW (ref 12.0–15.0)
MCH: 28.3 pg (ref 26.0–34.0)
MCHC: 30.2 g/dL (ref 30.0–36.0)
MCV: 94 fL (ref 78.0–100.0)
Platelets: 499 10*3/uL — ABNORMAL HIGH (ref 150–400)
RBC: 3.81 MIL/uL — ABNORMAL LOW (ref 3.87–5.11)
RDW: 15.8 % — ABNORMAL HIGH (ref 11.5–15.5)
WBC: 18.9 10*3/uL — ABNORMAL HIGH (ref 4.0–10.5)

## 2015-06-21 LAB — I-STAT TROPONIN, ED: Troponin i, poc: 0.02 ng/mL (ref 0.00–0.08)

## 2015-06-21 LAB — BRAIN NATRIURETIC PEPTIDE: B Natriuretic Peptide: 261.8 pg/mL — ABNORMAL HIGH (ref 0.0–100.0)

## 2015-06-21 LAB — I-STAT CG4 LACTIC ACID, ED: Lactic Acid, Venous: 1.35 mmol/L (ref 0.5–2.0)

## 2015-06-21 MED ORDER — ALBUTEROL (5 MG/ML) CONTINUOUS INHALATION SOLN
INHALATION_SOLUTION | RESPIRATORY_TRACT | Status: AC
  Administered 2015-06-21: 20 mg via RESPIRATORY_TRACT
  Filled 2015-06-21: qty 20

## 2015-06-21 MED ORDER — ALBUTEROL SULFATE (2.5 MG/3ML) 0.083% IN NEBU: 5.0000 mg | INHALATION_SOLUTION | Freq: Once | RESPIRATORY_TRACT | Status: DC

## 2015-06-21 MED ORDER — ALBUTEROL (5 MG/ML) CONTINUOUS INHALATION SOLN
20.0000 mg | INHALATION_SOLUTION | RESPIRATORY_TRACT | Status: DC
  Administered 2015-06-21: 20 mg via RESPIRATORY_TRACT

## 2015-06-21 MED ORDER — ALBUTEROL SULFATE (2.5 MG/3ML) 0.083% IN NEBU
5.0000 mg | INHALATION_SOLUTION | Freq: Once | RESPIRATORY_TRACT | Status: DC
  Filled 2015-06-21: qty 6

## 2015-06-21 MED ORDER — DEXTROSE 5 % IV SOLN
1.0000 g | Freq: Once | INTRAVENOUS | Status: AC
  Administered 2015-06-21: 1 g via INTRAVENOUS
  Filled 2015-06-21: qty 10

## 2015-06-21 MED ORDER — DEXTROSE 5 % IV SOLN
500.0000 mg | Freq: Once | INTRAVENOUS | Status: AC
  Administered 2015-06-22: 500 mg via INTRAVENOUS
  Filled 2015-06-21: qty 500

## 2015-06-21 NOTE — ED Provider Notes (Signed)
CSN: [id number]     Arrival date & time 06/21/15  2155 History   First MD Initiated Contact with Patient 06/21/15 2225     Chief Complaint  Patient presents with  . Shortness of Breath   Patient is a 55 y.o. female presenting with shortness of breath. The history is provided by the patient. The history is limited by the condition of the patient.  Shortness of Breath Severity:  Severe Onset quality:  Gradual Duration:  4 days Timing:  Constant Progression:  Worsening Chronicity:  New Relieved by:  Nothing Worsened by:  Exertion Ineffective treatments:  Inhaler, rest and position changes (nebulizer) Associated symptoms: cough and sputum production   Associated symptoms: no abdominal pain, no fever, no rash and no vomiting   Cough:    Cough characteristics:  Productive   Sputum characteristics:  Yellow and bloody   Severity:  Severe   Onset quality:  Gradual   Duration:  4 days   Timing:  Constant   Progression:  Worsening   Chronicity:  New Risk factors: obesity     Past Medical History  Diagnosis Date  . High blood pressure   . Incontinence   . Anxiety   . Manic depression ([place])   . Heart attack ([place])   . Diabetes mellitus     class 2  . Asthma   . Emphysema   . High cholesterol   . Stroke ([place])   . Sinus trouble   . Seasonal allergies   . Sleep apnea   . Disorder of nervous system   . Myocardial infarct ([place])   . Complication of anesthesia     decreased bp, decreased heart rate   Past Surgical History  Procedure Laterality Date  . Abdominal hysterectomy    . Cesarean section    . Hiatal hernia repair     Family History  Problem Relation Age of Onset  . Cancer Father     prostate  . Heart failure      cousin  . Cancer Mother     lung  . Depression Mother   . Depression Sister   . Anxiety disorder Sister   . Schizophrenia Sister   . Bipolar disorder Sister   . Depression Sister   . Depression Brother    Social History  Substance Use Topics  .  Smoking status: Current Every Day Smoker -- 0.50 packs/day for 37 years    Types: Cigarettes    Start date: 01/16/1977  . Smokeless tobacco: Never Used     Comment: Previous 2 PPD menthol full flavor  . Alcohol Use: No   OB History    No data available     Review of Systems  Constitutional: Positive for chills. Negative for fever.  HENT: Positive for congestion.   Eyes: Negative for redness.  Respiratory: Positive for cough, sputum production, chest tightness and shortness of breath.   Gastrointestinal: Negative for vomiting and abdominal pain.  Genitourinary: Negative for flank pain and decreased urine volume.  Musculoskeletal: Negative for neck stiffness.  Skin: Negative for rash.  Neurological: Negative for speech difficulty.  Psychiatric/Behavioral: Negative for confusion.      Allergies  Hydroxyzine; Codeine; and Sulfa antibiotics  Home Medications   Prior to Admission medications   Medication Sig Start Date End Date Taking? Authorizing Provider  ACCU-CHEK SOFTCLIX LANCETS lancets  07/28/14   Historical Provider, MD  albuterol (PROVENTIL HFA;VENTOLIN HFA) 108 (90 BASE) MCG/ACT inhaler Inhale 2 puffs into the lungs every 6 (  six) hours as needed. 10/16/14   Archie Patten, MD  albuterol (PROVENTIL) (2.5 MG/3ML) 0.083% nebulizer solution Take 3 mLs (2.5 mg total) by nebulization every 6 (six) hours as needed for wheezing or shortness of breath. 10/20/14   Archie Patten, MD  amLODipine (NORVASC) 5 MG tablet Take 2 tablets (10 mg total) by mouth daily. 06/01/15   Archie Patten, MD  aspirin 81 MG EC tablet TAKE 1 TABLET (81 MG TOTAL) BY MOUTH DAILY. 08/03/14   Archie Patten, MD  atorvastatin (LIPITOR) 40 MG tablet Take 1 tablet (40 mg total) by mouth daily. 05/10/15   Archie Patten, MD  Blood Glucose Monitoring Suppl (ACCU-CHEK AVIVA PLUS) w/Device KIT TEST FOUR TIMES DAILY 05/25/15   Archie Patten, MD  diphenhydrAMINE (BENADRYL) 25 mg capsule Take 50 mg by mouth every  6 (six) hours as needed.    Historical Provider, MD  escitalopram (LEXAPRO) 20 MG tablet Take 1 tablet (20 mg total) by mouth daily. 04/27/15   Kathlee Nations, MD  fluticasone furoate-vilanterol (BREO ELLIPTA) 100-25 MCG/INH AEPB Inhale 1 puff into the lungs daily. 06/01/15   Archie Patten, MD  furosemide (LASIX) 40 MG tablet Take 1 tablet (40 mg total) by mouth daily. 10/20/14   Archie Patten, MD  gabapentin (NEURONTIN) 600 MG tablet Take 1 tablet (600 mg total) by mouth 3 (three) times daily. 04/15/15   Archie Patten, MD  lisinopril (PRINIVIL,ZESTRIL) 40 MG tablet TAKE 1 TABLET EVERY DAY 04/15/15   Archie Patten, MD  metFORMIN (GLUCOPHAGE-XR) 500 MG 24 hr tablet TAKE 2 TABLETS EVERY DAY  WITH  BREAKFAST 04/15/15   Archie Patten, MD  mirtazapine (REMERON) 15 MG tablet Take 1 tablet (15 mg total) by mouth at bedtime. 04/27/15   Kathlee Nations, MD  montelukast (SINGULAIR) 10 MG tablet Take 1 tablet (10 mg total) by mouth at bedtime. 03/10/15   Archie Patten, MD  Multiple Vitamins-Minerals (MULTIVITAMIN WITH MINERALS) tablet Take 1 tablet by mouth daily.    Historical Provider, MD  naproxen (NAPROSYN) 500 MG tablet Take 1 tablet (500 mg total) by mouth 3 (three) times daily as needed. 03/15/15   Archie Patten, MD  omeprazole (PRILOSEC) 40 MG capsule Take 1 capsule (40 mg total) by mouth daily. 12/08/14   Archie Patten, MD  oxybutynin (DITROPAN) 5 MG tablet Take 1 tablet (5 mg total) by mouth 3 (three) times daily. 04/15/15   Archie Patten, MD  oxymetazoline (AFRIN) 0.05 % nasal spray Place 1 spray into both nostrils 2 (two) times daily.    Historical Provider, MD  permethrin (ACTICIN) 5 % cream Apply 1 application topically once. Apply from head to soles of feet at bedtime, then wash off 8-14 hours after 06/01/15   Archie Patten, MD  potassium chloride (K-DUR,KLOR-CON) 10 MEQ tablet TAKE 2 TABLETS EVERY DAY 04/06/15   Archie Patten, MD  terbinafine (LAMISIL) 250 MG tablet Take 1  tablet (250 mg total) by mouth daily. Patient not taking: Reported on 05/14/2015 01/20/15   Archie Patten, MD  tiotropium (SPIRIVA HANDIHALER) 18 MCG inhalation capsule Place 1 capsule (18 mcg total) into inhaler and inhale daily. 10/20/14   Archie Patten, MD  verapamil (VERELAN PM) 240 MG 24 hr capsule TAKE 2 CAPSULES AT BEDTIME 06/21/15   Archie Patten, MD  ziprasidone (GEODON) 80 MG capsule Take 1 capsule (80 mg total) by mouth 2 (two) times daily with a  meal. 04/27/15   Kathlee Nations, MD   BP 115/91 mmHg  Pulse 80  Temp(Src) 98.4 F (36.9 C) (Oral)  Resp 31  SpO2 93% Physical Exam  Constitutional: [name] oriented to person, place, and time. She appears well-developed and well-nourished. [name] cooperative. No distress.  HENT:  Head: Normocephalic and atraumatic.  Right Ear: External ear normal.  Left Ear: External ear normal.  Neck: Normal range of motion and phonation normal.  Cardiovascular: Normal rate and regular rhythm.   Pulmonary/Chest: She has decreased breath sounds in the left upper field, the left middle field and the left lower field.  Tacyhpneic, normal oxygen saturation on a NRB, able to speak in 3-4 word sentences on the NRB  Abdominal: Soft. She exhibits no distension. There is no tenderness. There is no rebound and no guarding.  Neurological: [name] alert and oriented to person, place, and time.  Skin: Skin is warm and dry. No rash noted. [name] not diaphoretic.    ED Course  Procedures (including critical care time) Labs Review Labs Reviewed  BASIC METABOLIC PANEL - Abnormal; Notable for the following:    Potassium 5.2 (*)    Creatinine, Ser 1.61 (*)    GFR calc non Af Amer 35 (*)    GFR calc Af Amer 40 (*)    All other components within normal limits  CBC - Abnormal; Notable for the following:    WBC 18.9 (*)    RBC 3.81 (*)    Hemoglobin 10.8 (*)    HCT 35.8 (*)    RDW 15.8 (*)    Platelets 499 (*)    All other components within normal limits   BRAIN NATRIURETIC PEPTIDE - Abnormal; Notable for the following:    B Natriuretic Peptide 261.8 (*)    All other components within normal limits  I-STAT ARTERIAL BLOOD GAS, ED - Abnormal; Notable for the following:    pH, Arterial 7.229 (*)    pCO2 arterial 59.7 (*)    pO2, Arterial 67.0 (*)    Bicarbonate 24.9 (*)    Acid-base deficit 3.0 (*)    All other components within normal limits  I-STAT VENOUS BLOOD GAS, ED - Abnormal; Notable for the following:    pH, Ven 7.198 (*)    pCO2, Ven 60.1 (*)    pO2, Ven 80.0 (*)    Acid-base deficit 5.0 (*)    All other components within normal limits  CULTURE, BLOOD (ROUTINE X 2)  CULTURE, BLOOD (ROUTINE X 2)  URINE CULTURE  URINALYSIS, ROUTINE W REFLEX MICROSCOPIC (NOT AT [facility])  BLOOD GAS, ARTERIAL  I-STAT CG4 LACTIC ACID, ED  Randolm Idol, ED    Imaging Review Dg Chest Portable 1 View  06/21/2015  CLINICAL DATA:  Initial evaluation for acute shortness of breath. EXAM: PORTABLE CHEST 1 VIEW COMPARISON:  Prior radiograph from 02/14/2015. FINDINGS: Mild cardiomegaly is stable from previous. Mediastinal silhouette within normal limits. There are extensive patchy opacities throughout the left mid and lower lung, concerning for pneumonia. A component of superimposed atelectasis may be present. Additional small focus of patchy opacity within the inferior right upper lobe noted. Right lung is otherwise grossly clear. Mild vascular congestion without overt pulmonary edema. No definite pleural effusion. No pneumothorax. No acute osseus abnormality. IMPRESSION: 1. Extensive opacity throughout the mid and lower left lung, concerning for pneumonia. 2. Grossly stable cardiomegaly with pulmonary vascular congestion without overt pulmonary edema. Electronically Signed   By: Pincus Badder.D.  On: 06/21/2015 22:34   I have personally reviewed and evaluated these images and lab results as part of my medical decision-making.   EKG  Interpretation   Date/Time:  Monday June 21 2015 22:03:00 EDT Ventricular Rate:  83 PR Interval:  146 QRS Duration: 70 QT Interval:  362 QTC Calculation: 425 R Axis:   31 Text Interpretation:  Sinus rhythm with occasional Premature ventricular  complexes Otherwise normal ECG Nonspecific ST abnormality - avR No acute  changes No significant change since last tracing Confirmed by Kathrynn Humble,  MD, Thelma Comp [phone]) on 06/22/2015 12:43:23 AM      MDM   Final diagnoses:  CAP (community acquired pneumonia)    55 y.o. female With a past medical history of hypertension, MI, diabetes, CVA, asthma presents to the ED due to worsening shortness of breath the last 4 days. Remainder of history of present illness, review of systems, exam as above.  Chest x-ray notable for patchy opacities concerning for pneumonia. Code sepsis was called. This is community-acquired pneumonia. We will cover her with Rocephin and azithromycin.  Labs reviewed by me. BMP notable for tracely elevated potassium, mildly elevated creatinine. CBC notable for marked leukocytosis. Lactate within normal limits. Initial blood gas notable for a respiratory acidosis. BiPAP was started.  Will require admission. Family medicine consulted for admission.  Case managed in conjunction with my attending, Dr. Kathrynn Humble.   Berenice Primas, MD 06/22/15 [id number]  Varney Biles, MD 06/23/15 3005

## 2015-06-21 NOTE — ED Notes (Signed)
Patient with shortness of breath for the last few days, getting worse today.  Patient states she is having a hard time breathing at work.  Patient states she has been having chills with the shortness of breath.

## 2015-06-21 NOTE — ED Notes (Signed)
Respiratory at bedside.

## 2015-06-22 DIAGNOSIS — J9611 Chronic respiratory failure with hypoxia: Secondary | ICD-10-CM | POA: Diagnosis present

## 2015-06-22 DIAGNOSIS — Z7982 Long term (current) use of aspirin: Secondary | ICD-10-CM | POA: Diagnosis not present

## 2015-06-22 DIAGNOSIS — E669 Obesity, unspecified: Secondary | ICD-10-CM | POA: Diagnosis present

## 2015-06-22 DIAGNOSIS — I252 Old myocardial infarction: Secondary | ICD-10-CM | POA: Diagnosis not present

## 2015-06-22 DIAGNOSIS — R0602 Shortness of breath: Secondary | ICD-10-CM | POA: Diagnosis present

## 2015-06-22 DIAGNOSIS — E1140 Type 2 diabetes mellitus with diabetic neuropathy, unspecified: Secondary | ICD-10-CM | POA: Diagnosis present

## 2015-06-22 DIAGNOSIS — Z79899 Other long term (current) drug therapy: Secondary | ICD-10-CM | POA: Diagnosis not present

## 2015-06-22 DIAGNOSIS — J9601 Acute respiratory failure with hypoxia: Secondary | ICD-10-CM | POA: Diagnosis not present

## 2015-06-22 DIAGNOSIS — J9612 Chronic respiratory failure with hypercapnia: Secondary | ICD-10-CM

## 2015-06-22 DIAGNOSIS — K219 Gastro-esophageal reflux disease without esophagitis: Secondary | ICD-10-CM | POA: Diagnosis present

## 2015-06-22 DIAGNOSIS — N179 Acute kidney failure, unspecified: Secondary | ICD-10-CM | POA: Diagnosis present

## 2015-06-22 DIAGNOSIS — I2510 Atherosclerotic heart disease of native coronary artery without angina pectoris: Secondary | ICD-10-CM | POA: Diagnosis present

## 2015-06-22 DIAGNOSIS — E872 Acidosis: Secondary | ICD-10-CM | POA: Diagnosis present

## 2015-06-22 DIAGNOSIS — E860 Dehydration: Secondary | ICD-10-CM | POA: Diagnosis present

## 2015-06-22 DIAGNOSIS — E875 Hyperkalemia: Secondary | ICD-10-CM | POA: Diagnosis not present

## 2015-06-22 DIAGNOSIS — J440 Chronic obstructive pulmonary disease with acute lower respiratory infection: Secondary | ICD-10-CM | POA: Diagnosis present

## 2015-06-22 DIAGNOSIS — G894 Chronic pain syndrome: Secondary | ICD-10-CM | POA: Diagnosis present

## 2015-06-22 DIAGNOSIS — F319 Bipolar disorder, unspecified: Secondary | ICD-10-CM | POA: Diagnosis present

## 2015-06-22 DIAGNOSIS — D649 Anemia, unspecified: Secondary | ICD-10-CM | POA: Diagnosis present

## 2015-06-22 DIAGNOSIS — Z8673 Personal history of transient ischemic attack (TIA), and cerebral infarction without residual deficits: Secondary | ICD-10-CM | POA: Diagnosis not present

## 2015-06-22 DIAGNOSIS — J189 Pneumonia, unspecified organism: Secondary | ICD-10-CM | POA: Diagnosis present

## 2015-06-22 DIAGNOSIS — J9622 Acute and chronic respiratory failure with hypercapnia: Secondary | ICD-10-CM | POA: Diagnosis present

## 2015-06-22 DIAGNOSIS — E1122 Type 2 diabetes mellitus with diabetic chronic kidney disease: Secondary | ICD-10-CM | POA: Diagnosis present

## 2015-06-22 DIAGNOSIS — G4733 Obstructive sleep apnea (adult) (pediatric): Secondary | ICD-10-CM | POA: Diagnosis present

## 2015-06-22 DIAGNOSIS — Z7984 Long term (current) use of oral hypoglycemic drugs: Secondary | ICD-10-CM | POA: Diagnosis not present

## 2015-06-22 DIAGNOSIS — N189 Chronic kidney disease, unspecified: Secondary | ICD-10-CM | POA: Diagnosis present

## 2015-06-22 DIAGNOSIS — J441 Chronic obstructive pulmonary disease with (acute) exacerbation: Secondary | ICD-10-CM | POA: Diagnosis present

## 2015-06-22 DIAGNOSIS — I5032 Chronic diastolic (congestive) heart failure: Secondary | ICD-10-CM | POA: Diagnosis present

## 2015-06-22 DIAGNOSIS — J9602 Acute respiratory failure with hypercapnia: Secondary | ICD-10-CM

## 2015-06-22 DIAGNOSIS — F17210 Nicotine dependence, cigarettes, uncomplicated: Secondary | ICD-10-CM | POA: Diagnosis present

## 2015-06-22 DIAGNOSIS — I130 Hypertensive heart and chronic kidney disease with heart failure and stage 1 through stage 4 chronic kidney disease, or unspecified chronic kidney disease: Secondary | ICD-10-CM | POA: Diagnosis present

## 2015-06-22 DIAGNOSIS — J9621 Acute and chronic respiratory failure with hypoxia: Secondary | ICD-10-CM | POA: Diagnosis present

## 2015-06-22 DIAGNOSIS — Z6841 Body Mass Index (BMI) 40.0 and over, adult: Secondary | ICD-10-CM | POA: Diagnosis not present

## 2015-06-22 DIAGNOSIS — E7800 Pure hypercholesterolemia, unspecified: Secondary | ICD-10-CM | POA: Diagnosis present

## 2015-06-22 HISTORY — DX: Chronic respiratory failure with hypercapnia: J96.12

## 2015-06-22 HISTORY — DX: Chronic respiratory failure with hypoxia: J96.11

## 2015-06-22 HISTORY — DX: Pneumonia, unspecified organism: J18.9

## 2015-06-22 LAB — I-STAT ARTERIAL BLOOD GAS, ED
Acid-base deficit: 5 mmol/L — ABNORMAL HIGH (ref 0.0–2.0)
Acid-base deficit: 3 mmol/L — ABNORMAL HIGH (ref 0.0–2.0)
Bicarbonate: 24.3 mEq/L — ABNORMAL HIGH (ref 20.0–24.0)
Bicarbonate: 25.6 mEq/L — ABNORMAL HIGH (ref 20.0–24.0)
O2 Saturation: 94 %
O2 Saturation: 89 %
Patient temperature: 98.6
Patient temperature: 98.6
TCO2: 26 mmol/L (ref 0–100)
TCO2: 28 mmol/L (ref 0–100)
pCO2 arterial: 64.4 mmHg (ref 35.0–45.0)
pCO2 arterial: 64.6 mmHg (ref 35.0–45.0)
pH, Arterial: 7.184 — CL (ref 7.350–7.450)
pH, Arterial: 7.206 — ABNORMAL LOW (ref 7.350–7.450)
pO2, Arterial: 88 mmHg (ref 80.0–100.0)
pO2, Arterial: 71 mmHg — ABNORMAL LOW (ref 80.0–100.0)

## 2015-06-22 LAB — GLUCOSE, CAPILLARY
Glucose-Capillary: 88 mg/dL (ref 65–99)
Glucose-Capillary: 102 mg/dL — ABNORMAL HIGH (ref 65–99)
Glucose-Capillary: 120 mg/dL — ABNORMAL HIGH (ref 65–99)
Glucose-Capillary: 135 mg/dL — ABNORMAL HIGH (ref 65–99)

## 2015-06-22 LAB — BASIC METABOLIC PANEL
Anion gap: 12 (ref 5–15)
BUN: 19 mg/dL (ref 6–20)
CO2: 23 mmol/L (ref 22–32)
Calcium: 8.7 mg/dL — ABNORMAL LOW (ref 8.9–10.3)
Chloride: 102 mmol/L (ref 101–111)
Creatinine, Ser: 1.76 mg/dL — ABNORMAL HIGH (ref 0.44–1.00)
GFR calc Af Amer: 36 mL/min — ABNORMAL LOW (ref >60)
GFR calc non Af Amer: 31 mL/min — ABNORMAL LOW (ref >60)
Glucose, Bld: 89 mg/dL (ref 65–99)
Potassium: 5.4 mmol/L — ABNORMAL HIGH (ref 3.5–5.1)
Sodium: 137 mmol/L (ref 135–145)

## 2015-06-22 LAB — MRSA PCR SCREENING: MRSA by PCR: NEGATIVE

## 2015-06-22 LAB — STREP PNEUMONIAE URINARY ANTIGEN: Strep Pneumo Urinary Antigen: NEGATIVE

## 2015-06-22 LAB — CBC
HCT: 33.3 % — ABNORMAL LOW (ref 36.0–46.0)
Hemoglobin: 9.8 g/dL — ABNORMAL LOW (ref 12.0–15.0)
MCH: 27.8 pg (ref 26.0–34.0)
MCHC: 29.4 g/dL — ABNORMAL LOW (ref 30.0–36.0)
MCV: 94.3 fL (ref 78.0–100.0)
Platelets: 432 10*3/uL — ABNORMAL HIGH (ref 150–400)
RBC: 3.53 MIL/uL — ABNORMAL LOW (ref 3.87–5.11)
RDW: 15.9 % — ABNORMAL HIGH (ref 11.5–15.5)
WBC: 16.6 10*3/uL — ABNORMAL HIGH (ref 4.0–10.5)

## 2015-06-22 LAB — CBG MONITORING, ED: Glucose-Capillary: 88 mg/dL (ref 65–99)

## 2015-06-22 MED ORDER — MIRTAZAPINE 7.5 MG PO TABS
15.0000 mg | ORAL_TABLET | Freq: Every day | ORAL | Status: DC
  Administered 2015-06-22 – 2015-06-24 (×3): 15 mg via ORAL
  Filled 2015-06-22: qty 1
  Filled 2015-06-22: qty 2
  Filled 2015-06-22: qty 1

## 2015-06-22 MED ORDER — FLUTICASONE FUROATE-VILANTEROL 100-25 MCG/INH IN AEPB: 1.0000 | INHALATION_SPRAY | Freq: Every day | RESPIRATORY_TRACT | Status: DC

## 2015-06-22 MED ORDER — SODIUM CHLORIDE 0.9 % IV SOLN
250.0000 mL | INTRAVENOUS | Status: DC | PRN
  Administered 2015-06-22: 1000 mL via INTRAVENOUS
  Administered 2015-06-23: 250 mL via INTRAVENOUS

## 2015-06-22 MED ORDER — ARFORMOTEROL TARTRATE 15 MCG/2ML IN NEBU
15.0000 ug | INHALATION_SOLUTION | Freq: Two times a day (BID) | RESPIRATORY_TRACT | Status: DC
  Administered 2015-06-22 – 2015-06-24 (×6): 15 ug via RESPIRATORY_TRACT
  Filled 2015-06-22 (×7): qty 2

## 2015-06-22 MED ORDER — ACETAMINOPHEN 650 MG RE SUPP: 650.0000 mg | Freq: Four times a day (QID) | RECTAL | Status: DC | PRN

## 2015-06-22 MED ORDER — CHLORHEXIDINE GLUCONATE 0.12 % MT SOLN
15.0000 mL | Freq: Two times a day (BID) | OROMUCOSAL | Status: DC
  Administered 2015-06-22 – 2015-06-24 (×4): 15 mL via OROMUCOSAL
  Filled 2015-06-22 (×2): qty 15

## 2015-06-22 MED ORDER — SODIUM CHLORIDE 0.9 % IV SOLN: 250.0000 mL | INTRAVENOUS | Status: DC | PRN

## 2015-06-22 MED ORDER — NAPROXEN 250 MG PO TABS: 500.0000 mg | ORAL_TABLET | Freq: Three times a day (TID) | ORAL | Status: DC | PRN

## 2015-06-22 MED ORDER — VERAPAMIL HCL ER 240 MG PO TBCR
480.0000 mg | EXTENDED_RELEASE_TABLET | Freq: Every day | ORAL | Status: DC
  Administered 2015-06-22 – 2015-06-24 (×3): 480 mg via ORAL
  Filled 2015-06-22 (×4): qty 2

## 2015-06-22 MED ORDER — GABAPENTIN 600 MG PO TABS: 600.0000 mg | ORAL_TABLET | Freq: Three times a day (TID) | ORAL | Status: DC

## 2015-06-22 MED ORDER — CETYLPYRIDINIUM CHLORIDE 0.05 % MT LIQD
7.0000 mL | Freq: Two times a day (BID) | OROMUCOSAL | Status: DC
  Administered 2015-06-23 – 2015-06-24 (×4): 7 mL via OROMUCOSAL

## 2015-06-22 MED ORDER — IPRATROPIUM-ALBUTEROL 0.5-2.5 (3) MG/3ML IN SOLN
RESPIRATORY_TRACT | Status: AC
  Filled 2015-06-22: qty 3

## 2015-06-22 MED ORDER — GABAPENTIN 600 MG PO TABS
300.0000 mg | ORAL_TABLET | Freq: Three times a day (TID) | ORAL | Status: DC
  Filled 2015-06-22: qty 0.5

## 2015-06-22 MED ORDER — ESCITALOPRAM OXALATE 10 MG PO TABS
20.0000 mg | ORAL_TABLET | Freq: Every day | ORAL | Status: DC
  Administered 2015-06-22 – 2015-06-25 (×4): 20 mg via ORAL
  Filled 2015-06-22 (×3): qty 1
  Filled 2015-06-22: qty 2

## 2015-06-22 MED ORDER — DEXTROSE 5 % IV SOLN: 500.0000 mg | INTRAVENOUS | Status: DC

## 2015-06-22 MED ORDER — TIOTROPIUM BROMIDE MONOHYDRATE 18 MCG IN CAPS: 18.0000 ug | ORAL_CAPSULE | Freq: Every day | RESPIRATORY_TRACT | Status: DC

## 2015-06-22 MED ORDER — DEXTROSE 5 % IV SOLN: 1.0000 g | INTRAVENOUS | Status: DC

## 2015-06-22 MED ORDER — ATORVASTATIN CALCIUM 40 MG PO TABS
40.0000 mg | ORAL_TABLET | Freq: Every day | ORAL | Status: DC
  Administered 2015-06-22 – 2015-06-25 (×4): 40 mg via ORAL
  Filled 2015-06-22 (×4): qty 1

## 2015-06-22 MED ORDER — BUDESONIDE 0.5 MG/2ML IN SUSP
0.5000 mg | Freq: Two times a day (BID) | RESPIRATORY_TRACT | Status: DC
  Administered 2015-06-22 – 2015-06-24 (×6): 0.5 mg via RESPIRATORY_TRACT
  Filled 2015-06-22 (×7): qty 2

## 2015-06-22 MED ORDER — ASPIRIN EC 81 MG PO TBEC
81.0000 mg | DELAYED_RELEASE_TABLET | Freq: Every day | ORAL | Status: DC
  Administered 2015-06-22 – 2015-06-25 (×4): 81 mg via ORAL
  Filled 2015-06-22 (×4): qty 1

## 2015-06-22 MED ORDER — ENOXAPARIN SODIUM 40 MG/0.4ML ~~LOC~~ SOLN
40.0000 mg | SUBCUTANEOUS | Status: DC
  Administered 2015-06-22 – 2015-06-24 (×3): 40 mg via SUBCUTANEOUS
  Filled 2015-06-22 (×3): qty 0.4

## 2015-06-22 MED ORDER — ACETAMINOPHEN 325 MG PO TABS: 650.0000 mg | ORAL_TABLET | ORAL | Status: DC | PRN

## 2015-06-22 MED ORDER — ACETAMINOPHEN 325 MG PO TABS: 650.0000 mg | ORAL_TABLET | Freq: Four times a day (QID) | ORAL | Status: DC | PRN

## 2015-06-22 MED ORDER — ZIPRASIDONE HCL 80 MG PO CAPS
80.0000 mg | ORAL_CAPSULE | Freq: Two times a day (BID) | ORAL | Status: DC
  Administered 2015-06-22 – 2015-06-25 (×6): 80 mg via ORAL
  Filled 2015-06-22 (×9): qty 1

## 2015-06-22 MED ORDER — ONDANSETRON HCL 4 MG/2ML IJ SOLN: 4.0000 mg | Freq: Four times a day (QID) | INTRAMUSCULAR | Status: DC | PRN

## 2015-06-22 MED ORDER — PANTOPRAZOLE SODIUM 40 MG PO TBEC
80.0000 mg | DELAYED_RELEASE_TABLET | Freq: Every day | ORAL | Status: DC
  Administered 2015-06-22 – 2015-06-25 (×4): 80 mg via ORAL
  Filled 2015-06-22 (×4): qty 2

## 2015-06-22 MED ORDER — IPRATROPIUM-ALBUTEROL 0.5-2.5 (3) MG/3ML IN SOLN
3.0000 mL | Freq: Four times a day (QID) | RESPIRATORY_TRACT | Status: DC
  Administered 2015-06-22 – 2015-06-25 (×13): 3 mL via RESPIRATORY_TRACT
  Filled 2015-06-22 (×13): qty 3

## 2015-06-22 MED ORDER — SODIUM CHLORIDE 0.9% FLUSH
3.0000 mL | Freq: Two times a day (BID) | INTRAVENOUS | Status: DC
Start: 2015-06-22 — End: 2015-06-22

## 2015-06-22 MED ORDER — DEXTROSE 5 % IV SOLN
500.0000 mg | INTRAVENOUS | Status: DC
  Administered 2015-06-23 – 2015-06-25 (×4): 500 mg via INTRAVENOUS
  Filled 2015-06-22 (×3): qty 500

## 2015-06-22 MED ORDER — AMLODIPINE BESYLATE 10 MG PO TABS
10.0000 mg | ORAL_TABLET | Freq: Every day | ORAL | Status: DC
  Administered 2015-06-22 – 2015-06-25 (×4): 10 mg via ORAL
  Filled 2015-06-22 (×4): qty 1

## 2015-06-22 MED ORDER — OXYBUTYNIN CHLORIDE 5 MG PO TABS
5.0000 mg | ORAL_TABLET | Freq: Three times a day (TID) | ORAL | Status: DC
  Administered 2015-06-22 – 2015-06-25 (×10): 5 mg via ORAL
  Filled 2015-06-22 (×11): qty 1

## 2015-06-22 MED ORDER — METHYLPREDNISOLONE SODIUM SUCC 125 MG IJ SOLR
60.0000 mg | Freq: Two times a day (BID) | INTRAMUSCULAR | Status: DC
  Administered 2015-06-22 – 2015-06-24 (×6): 60 mg via INTRAVENOUS
  Filled 2015-06-22: qty 2
  Filled 2015-06-22 (×3): qty 0.96
  Filled 2015-06-22: qty 2
  Filled 2015-06-22 (×2): qty 0.96

## 2015-06-22 MED ORDER — IPRATROPIUM-ALBUTEROL 0.5-2.5 (3) MG/3ML IN SOLN
3.0000 mL | RESPIRATORY_TRACT | Status: DC
  Administered 2015-06-22: 3 mL via RESPIRATORY_TRACT

## 2015-06-22 MED ORDER — DEXTROSE 5 % IV SOLN
2.0000 g | INTRAVENOUS | Status: DC
  Administered 2015-06-22 – 2015-06-23 (×2): 2 g via INTRAVENOUS
  Filled 2015-06-22 (×3): qty 2

## 2015-06-22 MED ORDER — GABAPENTIN 300 MG PO CAPS
300.0000 mg | ORAL_CAPSULE | Freq: Three times a day (TID) | ORAL | Status: DC
  Administered 2015-06-22 – 2015-06-25 (×10): 300 mg via ORAL
  Filled 2015-06-22 (×11): qty 1

## 2015-06-22 MED ORDER — LISINOPRIL 20 MG PO TABS: 40.0000 mg | ORAL_TABLET | Freq: Every day | ORAL | Status: DC

## 2015-06-22 MED ORDER — MONTELUKAST SODIUM 10 MG PO TABS
10.0000 mg | ORAL_TABLET | Freq: Every day | ORAL | Status: DC
  Administered 2015-06-22 – 2015-06-24 (×3): 10 mg via ORAL
  Filled 2015-06-22 (×3): qty 1

## 2015-06-22 MED ORDER — INSULIN ASPART 100 UNIT/ML ~~LOC~~ SOLN
0.0000 [IU] | SUBCUTANEOUS | Status: DC
  Administered 2015-06-22 – 2015-06-23 (×2): 3 [IU] via SUBCUTANEOUS
  Administered 2015-06-23: 4 [IU] via SUBCUTANEOUS
  Administered 2015-06-23 (×3): 3 [IU] via SUBCUTANEOUS
  Administered 2015-06-23: 4 [IU] via SUBCUTANEOUS
  Administered 2015-06-23: 7 [IU] via SUBCUTANEOUS
  Administered 2015-06-24 (×2): 3 [IU] via SUBCUTANEOUS

## 2015-06-22 NOTE — ED Notes (Signed)
Family practice paged regarding ABG results

## 2015-06-22 NOTE — ED Notes (Signed)
CCM at bedside 

## 2015-06-22 NOTE — Consult Note (Signed)
PULMONARY / CRITICAL CARE MEDICINE   Name: Tiffany [name] MRN: [id number] DOB: 1960-11-05    ADMISSION DATE:  06/21/2015 CONSULTATION DATE:  06/22/2015  REFERRING MD:  FPTS  CHIEF COMPLAINT:  Cough  HISTORY OF PRESENT ILLNESS:    55 y/o female with asthma and obesity presented on 6/6 to the [facility] ER with cough.  She notes having a cough productive of yellow and red mucus for three days prior to admission associated with some mild dyspnea and chills.  She denied chest pain or any other pain.  She denied leg swelling.  In the ER she was noted to have severe hypoxemia so she was placed on BIPAP.  PCCM was consulted because of a persistent respiratory acidosis despite BIPAP.  Upon my entering the room she was wide awake stating that she doesn't feel short of breath, she is just thirsty because of the mask (BIPAP mask).  She again denies pain.  She desperately wants to take the BIPAP mask off.  Her primary care notes state she has COPD but I am unable to locate PFTs.  She tells me she continues to smoke 1ppd and has smoked since age 77.  She notes a 10 pounds weight loss in the last month.  The respiratory therapist reports that the patient has been awake and breathing remarkably comfortable despite her low oxygen level.  PAST MEDICAL HISTORY :  She  has a past medical history of High blood pressure; Incontinence; Anxiety; Manic depression ([place]); Heart attack ([place]); Diabetes mellitus; Asthma; Emphysema; High cholesterol; Stroke ([place]); Sinus trouble; Seasonal allergies; Sleep apnea; Disorder of nervous system; Myocardial infarct [name]); and Complication of anesthesia.  PAST SURGICAL HISTORY: She  has past surgical history that includes Abdominal hysterectomy; Cesarean section; and Hiatal hernia repair.  Allergies  Allergen Reactions  . Hydroxyzine Other (See Comments)    unknown  . Codeine Nausea And Vomiting  . Sulfa Antibiotics Itching    No current facility-administered medications on file  prior to encounter.   Current Outpatient Prescriptions on File Prior to Encounter  Medication Sig  . ACCU-CHEK SOFTCLIX LANCETS lancets   . albuterol (PROVENTIL HFA;VENTOLIN HFA) 108 (90 BASE) MCG/ACT inhaler Inhale 2 puffs into the lungs every 6 (six) hours as needed.  [name] albuterol (PROVENTIL) (2.5 MG/3ML) 0.083% nebulizer solution Take 3 mLs (2.5 mg total) by nebulization every 6 (six) hours as needed for wheezing or shortness of breath.  [name] amLODipine (NORVASC) 5 MG tablet Take 2 tablets (10 mg total) by mouth daily.  [name] aspirin 81 MG EC tablet TAKE 1 TABLET (81 MG TOTAL) BY MOUTH DAILY.  [name] atorvastatin (LIPITOR) 40 MG tablet Take 1 tablet (40 mg total) by mouth daily.  . Blood Glucose Monitoring Suppl (ACCU-CHEK AVIVA PLUS) w/Device KIT TEST FOUR TIMES DAILY  . diphenhydrAMINE (BENADRYL) 25 mg capsule Take 50 mg by mouth every 6 (six) hours as needed.  [name] escitalopram (LEXAPRO) 20 MG tablet Take 1 tablet (20 mg total) by mouth daily.  . fluticasone furoate-vilanterol (BREO ELLIPTA) 100-25 MCG/INH AEPB Inhale 1 puff into the lungs daily.  . furosemide (LASIX) 40 MG tablet Take 1 tablet (40 mg total) by mouth daily.  [name] gabapentin (NEURONTIN) 600 MG tablet Take 1 tablet (600 mg total) by mouth 3 (three) times daily.  [name] lisinopril (PRINIVIL,ZESTRIL) 40 MG tablet TAKE 1 TABLET EVERY DAY  . metFORMIN (GLUCOPHAGE-XR) 500 MG 24 hr tablet TAKE 2 TABLETS EVERY DAY  WITH  BREAKFAST  . mirtazapine (REMERON) 15 MG tablet  Take 1 tablet (15 mg total) by mouth at bedtime.  . montelukast (SINGULAIR) 10 MG tablet Take 1 tablet (10 mg total) by mouth at bedtime.  . Multiple Vitamins-Minerals (MULTIVITAMIN WITH MINERALS) tablet Take 1 tablet by mouth daily.  . naproxen (NAPROSYN) 500 MG tablet Take 1 tablet (500 mg total) by mouth 3 (three) times daily as needed.  [name] omeprazole (PRILOSEC) 40 MG capsule Take 1 capsule (40 mg total) by mouth daily.  [name] oxybutynin (DITROPAN) 5 MG tablet Take 1 tablet (5 mg total) by  mouth 3 (three) times daily.  [name] oxymetazoline (AFRIN) 0.05 % nasal spray Place 1 spray into both nostrils 2 (two) times daily.  . permethrin (ACTICIN) 5 % cream Apply 1 application topically once. Apply from head to soles of feet at bedtime, then wash off 8-14 hours after  . potassium chloride (K-DUR,KLOR-CON) 10 MEQ tablet TAKE 2 TABLETS EVERY DAY  . terbinafine (LAMISIL) 250 MG tablet Take 1 tablet (250 mg total) by mouth daily. (Patient not taking: Reported on 05/14/2015)  . tiotropium (SPIRIVA HANDIHALER) 18 MCG inhalation capsule Place 1 capsule (18 mcg total) into inhaler and inhale daily.  . verapamil (VERELAN PM) 240 MG 24 hr capsule TAKE 2 CAPSULES AT BEDTIME  . ziprasidone (GEODON) 80 MG capsule Take 1 capsule (80 mg total) by mouth 2 (two) times daily with a meal.    FAMILY HISTORY:  Her indicated that her mother is alive. She indicated that her father is alive. She indicated that all of her three sisters are alive. She indicated that all of her three brothers are alive.   SOCIAL HISTORY: She  reports that she has been smoking Cigarettes.  She started smoking about 38 years ago. She has a 18.5 pack-year smoking history. She has never used smokeless tobacco. She reports that she does not drink alcohol or use illicit drugs.  REVIEW OF SYSTEMS:   Gen: Denies fever, chills, weight change, fatigue, night sweats HEENT: Denies blurred vision, double vision, hearing loss, tinnitus, sinus congestion, rhinorrhea, sore throat, neck stiffness, dysphagia PULM: per HPI CV: Denies chest pain, edema, orthopnea, paroxysmal nocturnal dyspnea, palpitations GI: Denies abdominal pain, nausea, vomiting, diarrhea, hematochezia, melena, constipation, change in bowel habits GU: Denies dysuria, hematuria, polyuria, oliguria, urethral discharge Endocrine: Denies hot or cold intolerance, polyuria, polyphagia or appetite change Derm: Denies rash, dry skin, scaling or peeling skin change Heme: Denies easy  bruising, bleeding, bleeding gums Neuro: Denies headache, numbness, weakness, slurred speech, loss of memory or consciousness   SUBJECTIVE:  As above  VITAL SIGNS: BP 101/59 mmHg  Pulse 64  Temp(Src) 98.4 F (36.9 C) (Oral)  Resp 21  Wt 118.842 kg (262 lb)  SpO2 95%  HEMODYNAMICS:    VENTILATOR SETTINGS: Vent Mode:  [-]  FiO2 (%):  [35 %-60 %] 35 %  INTAKE / OUTPUT:    PHYSICAL EXAMINATION: General:  Obese female, awake and alert on BIPAP, no distress Neuro:  Alert and oriented x4, moves all four extremities well HEENT:  NCAT, BIPAP mask in place Cardiovascular:  RRR, no mgr Lungs:  Diminished left base, normal respiratory effort (RR 19-22 for me), some ronchi left upper lobe Abdomen:  BS+, soft, nontender Musculoskeletal:  Normal bulk and tone Skin:  Few tattoos, no rash or skin breakdown  LABS:  BMET  Recent Labs Lab 06/21/15 2229  NA 138  K 5.2*  CL 105  CO2 26  BUN 17  CREATININE 1.61*  GLUCOSE 99    Electrolytes  Recent  Labs Lab 06/21/15 2229  CALCIUM 9.0    CBC  Recent Labs Lab 06/21/15 2229  WBC 18.9*  HGB 10.8*  HCT 35.8*  PLT 499*    Coag's No results for input(s): APTT, INR in the last 168 hours.  Sepsis Markers  Recent Labs Lab 06/21/15 2315  LATICACIDVEN 1.35    ABG  Recent Labs Lab 06/21/15 2314 06/22/15 0350 06/22/15 0512  PHART 7.229* 7.184* 7.206*  PCO2ART 59.7* 64.4* 64.6*  PO2ART 67.0* 88.0 71.0*    Liver Enzymes No results for input(s): AST, ALT, ALKPHOS, BILITOT, ALBUMIN in the last 168 hours.  Cardiac Enzymes No results for input(s): TROPONINI, PROBNP in the last 168 hours.  Glucose No results for input(s): GLUCAP in the last 168 hours.  Imaging  CXR images personally reviewed showing dense consolidation in the left lower lobe  STUDIES:    CULTURES: 6/5 blood culture 6/6 urine culture (ordered)  ANTIBIOTICS: 6/6 ceftriaxone >  6/6 azithromycin >  SIGNIFICANT  EVENTS:   LINES/TUBES:   DISCUSSION: 55 y/o female smoker presented on 6/5 with acute respiratory failure with hypoxemia due to severe community acquired pneumonia and possibly a COPD exacerbation (though no PFTs on record).  She has an acute respiratory acidosis but she is showing no sign of respiratory failure despite her hypoxemia.  [name] not tiring out and using no accessory muscles.    [name] critically ill however given the severity of her hypoxemia, but currently she does not appear to be declining.  She needs close monitoring in the ICU.    ASSESSMENT / PLAN:  PULMONARY A: Severe community acquired pneumonia Acute respiratory failure with hypercarbia and hypoxemia Suspicion for COPD with exacerbation Cough Tobacco abuse P:   Close monitoring of respiratory status in ICU as she may yet need intubation; will try to hold off on intubation given her lack of increased work of breathing Continue BIPAP for now Try off of BIPAP and transition to high flow around breakfast time today (0800) Titrate O2 to maintain O2 saturation 88-92% Stop home bronchodilators Use duoneb q6h here Solumedrol 71m IV q12 hr for now Brovana bid Pulmicort BID Needs PFT as outpatient Stop lisinopril for now with cough  CARDIOVASCULAR A:  Hypertension Elevated proBNP> undetermined significance in setting of renal failure History of CAD P:  Continue ASA Continue home anti-hypertensives except lisinopril Tele monitoring  RENAL A:   Acute on chronic kidney failure Mild hyperkalemia P:   Gentle IVF Monitor BMET and UOP Replace electrolytes as needed  GASTROINTESTINAL A:   No acute issues GERD P:   NPO Continue home PPI  HEMATOLOGIC A:   Anemia without bleeding P:  Monitor for bleeding  INFECTIOUS A:   Severe CAP P:   Increase Ceftriaxone to 2gm Azithromycin to continue F/u culture Send urine strep and legionella antigen  ENDOCRINE A:   DM2   P:   SSI q4h, resistant  scale  NEUROLOGIC A:   Bipolar disorder P:   Continue home Geodon and SSRI and Remeron    FAMILY  - Updates: mother updated bedside by me  - Inter-disciplinary family meet or Palliative Care meeting due by:  day 6/13    My cc time 50 minutes  BRoselie Awkward MD [place] [facility] Pager: [phone]: [phone] 3pm or if no response, call [phone] 06/22/2015, 5:25 AM

## 2015-06-22 NOTE — Progress Notes (Signed)
Day rounds  Pt comfortable on Bipap. Requesting to be take off.  ABG stable.  Blood pressure 111/61, pulse 62, temperature 98.3 F (36.8 C), temperature source Axillary, resp. rate 23, height 5\' 4"  (1.626 m), weight 258 lb 2.5 oz (117.1 kg), SpO2 92 %. CVS-RRR RS- Scattered rhonchi, no wheeze Abd- Soft, + BS Ext- No edema  Labs and imaging reviewed.  Assessment: Acute resp failure CAP COPD exacernation  - Take off Bipap. Use as needed and at night.  - Supplemental O2 - Continue to monitor in ICU as she is an intubation risk - On abx, steroids, nebs.  Additional critical care time- 20 mins.  Marshell Garfinkel MD [facility] Pager [phone] If no answer or after 3pm call: [phone] 06/22/2015, 10:51 AM

## 2015-06-22 NOTE — ED Notes (Signed)
Family practice at bedside, to consult CCM.

## 2015-06-22 NOTE — Progress Notes (Signed)
Pt transported to 2M14 without event.  Report given and bipap hooked to wall.  RT monitoring as needed.

## 2015-06-22 NOTE — Progress Notes (Signed)
Pt removed from BiPAP and placed on 100% NRB per Dr. Vaughan Browner.  RT confirmed that he wanted her removed from BiPAP.

## 2015-06-22 NOTE — ED Notes (Signed)
Pt refusing in and out catheter for urine specimen.

## 2015-06-22 NOTE — Progress Notes (Signed)
[facility] Progress Note Patient Name: Tiffany [name] DOB: September 18, 1960 MRN: [id number]   Date of Service  06/22/2015  HPI/Events of Note  Now off BiPAP. Request for diet. Able to handle meds with sips.  eICU Interventions  Will order: 1. D/C NPO. 2. Carb Modified Liquid diet.     Intervention Category Intermediate Interventions: Other:  [name],[name] [name] 06/22/2015, 8:08 PM

## 2015-06-22 NOTE — H&P (Signed)
[facility] Admission History and Physical Service Pager: [phone]  Patient name: Tiffany [name] Medical record number: [id number] Date of birth: November 14, 1960 Age: 55 y.o. Gender: female  Primary Care Provider: Kathrine Cords, MD Consultants: Tiffany [name] Code Status: Full (confirmed on admission)  Chief Complaint: shortness of breath  Assessment and Plan: Tiffany [name] is a 55 y.o. female presenting with acute respiratory failure. PMH is significant for HTN, COPD, asthma, T2DM, HLD, depression, bipolar 1, obesity, tobacco abuse, remote history of suspected MI, OSA and seizures.  Acute on chronic respiratory failure on BIPAP, likely due to CAP, in setting of COPD Suggestive of CAP, given leukocytosis to 18.9 and CXR consistent with large left-sided opacity.   Patient has been intubated previously in setting of pneumonia (Jan 2012). Requiring bipap to maintain O2 saturations on admission. ABG with pH of 7.229, pCO2 59.7, pO2 67.0, bicarb 24.9, O2 sat 88, consistent with respiratory acidosis with metabolic compensation. Baseline pCO2 unknown. Patient tired but mentating appropriately.  - Admit to SDU, Attending Tiffany [name] - s/p IV rocephin 1 mg and azithromycin 500 mg in ED - Continue rocephin and IV azithromycin q24 hours. Plan to discontinue rocephin once patient out of SDU and WBC trending down. IV azithromycin should be continued for at least 2 days then switched to PO for total treatment course of at least 7 days.  - Continue continuous Bipap overnight. - NPO while on Bipap - Maintain SpO2 above 92% - Repeat ABG in a.m.  - Continue home COPD medications but replace prn albuterol with scheduled q2h duonebs - Blood cultures x 2 obtained - UA and urine cx ordered - In interval since admission, patient with repeat ABG due to some decreased awakening per nursing staff, worsening pH 7.184 (from 7.22) and inc pCO2 64.4 from 59.7 but improved O2 88.0, FPTS team has  already notified Tiffany [name] [place] and discussed case with on-call Tiffany [name] to notify of worsening resp status despite BIPAP, and Tiffany [name] to evaluate patient, will follow ABG  COPD, with hypercapnia and possible acute exacerbation Last treated for exacerbation 02/14/15.  - Continue home breo-ellipta 100-25 1 puff daily - Continue home spiriva once daily - Scheduled duonebs as above while hospitalized  - Consider addition of steroid in the acute setting if breathing status worsens  Bloody sputum: In setting of PNA with cough. Hgb stable at 10.8 on admission (Baseline ~11.5) - Monitor daily CBCs  Tobacco abuse: Has never tried to quit.  - Continues to smoke daily, > 20 year pack year history.  - Ask if she needs nicotine patch while hospitalized  AKI: Scr 1.61. (Baseline ~1 ) May be due to hypoperfusion 2/2 hypoxemia though LA < 2, without significant hypotension with SBP 100-120s (prior values usually more elevated vs HTN), likely some degree of mild dehydration with poor PO intake due to poor resp status / inc sleeping and inc insensible losses with acute illness - Monitor with daily BMPs  Chronic Diastolic (Grd 1) CHF with HFpEF: Do not suspect acute exacerbation. Last ECHO 07/08/14, showing EF 55-60%. BNP elevated at 261.8 but likely 2/2 increased metabolic demand in setting of infection.  - Hold home lasix 40 mg daily due to AKI, current NPO status, and as appears euvolemic/hypovolemic  HTN: Normotensive on admission (monitor closely as usually somewhat elevated -- could indicate developing sepsis) - Continue home amlodipine 10 mg and verapamil 480 mg daily.  - Hold lisinopril 40 mg daily for AKI  CAD risk, HLD: Denies chest  pain. I-stat troponin 0.02 in the ED. EKG unchanged from prior.  - Continue daily asa 81 mg - Continue lipitor 40 mg daily - Consider trending troponins and repeat EKG if patient develops worsening chest tightness or new chest pain in setting of acute illness with  CAP  T2DM: Well controlled. Last hgb A1c 5.6 on 06/01/15 - Hold home metformin - Monitor CGBs q4h - Not on insulin at home. No SSI at this time.   OSA: - Resume CPAP nightly when Bipap no longer required   Mood disorder: Followed by Tiffany. [name] - Continue lexapro 20 mg daily, remeron 15 mg qhs, and geodon 80 mg BID  Hx seizures and neuropathy: - Continue home gabapentin 639m TID   FEN/GI: NPO overnight on BiPap, SLIV Prophylaxis: lovenox SQ  Disposition: Admit to SDU for respiratory support with BIPAP, transition to floor once stable off BIPAP and tolerating supplemental O2, treating CAP with IV antibiotics, transition to PO as tolerated. Anticipated hospital stay 2-3 days.   History of Present Illness:  Tiffany HILGEMANis a 55y.o. female presenting with SOB that began 4 days ago (6/2). Associated symptoms of cough, chills, and sweating began at the same time. She reports feeling more tired over the last few days, and son says she has been sleeping more than usual. 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 She hoped to rest for a few days then feel better, but she continues to feel short of breath. She used her albuterol nebulizer x 15 yesterday without much relief. She reports that her cough is productive and has BRB in it, which has happened when she has had pneumonia in the past. She reports that she has had pneumonia multiple times. She was last treated for COPD exacerbation on 02/14/15. She has had a sensation of chest tightness but no chest pain. She has noticed some swelling of her legs but no worse than normal. She denies any difficulty taking her regular home medications. She continues to smoke daily. She has been on continuous home oxygen in the past but stopped using it a while ago, per son, though she is supposed to still be on it, per patient's mother. Patient's son says mother  has not been acting confused.   In the ED, patient was afebrile but tachypneic to 31 and hypoxic on RA, with O2 saturations in the mid-60s, per ED provider. Oxygen saturation improved on non-rebreather mask. She was found to have a respiratory acidosis and leukocytosis of 18.9. CXR revealed large opacity of left mid and lower lung fields, concerning for pneumonia.  Started on BIPAP.  Review Of Systems: Per HPI with the following additions: No diarrhea or dysuria.  Otherwise the remainder of the systems were negative.  Patient Active Problem List   Diagnosis Date Noted  . CAP (community acquired pneumonia) 06/22/2015  . Scabies 06/01/2015  . Onychomycosis 01/21/2015  . Nasal congestion 12/17/2014  . Type 2 diabetes mellitus with complication ([facility]   . Tobacco abuse 07/22/2014  . COPD (chronic obstructive pulmonary disease) ([place] 07/08/2014  . COPD exacerbation ([place] 06/26/2014  . Seborrheic keratoses 12/31/2013  . Thrush 09/19/2013  . Knee pain, bilateral 01/22/2013  . Seizure ([place] 01/04/2013  . Health care maintenance 11/25/2012  . Folliculitis 116/10/9602 . History of kidney stones 06/18/2012  . Chronic pain syndrome 06/18/2012  . Muscle spasm 06/18/2012  .  Elevated serum creatinine 04/28/2012  . Asthma with acute exacerbation 04/28/2012  . Allergic reaction 04/07/2012  . Bipolar 1 disorder ([place]) 12/08/2011  . Itching 09/06/2011  . HSV infection 08/30/2011  . Dyslipidemia 04/24/2011  . Anemia 04/24/2011  . Depression with anxiety 04/24/2011  . Diabetic neuropathy ([place]) 04/24/2011  . OSA on CPAP 10/18/2010  . Asthma 10/18/2010  . Obesity 10/18/2010  . HTN (hypertension) 10/18/2010  . Type 2 diabetes mellitus ([place]) 10/18/2010    Past Medical History: Past Medical History  Diagnosis Date  . High blood pressure   . Incontinence   . Anxiety   . Manic depression ([place])   . Heart attack ([place])   . Diabetes mellitus     class 2  . Asthma   . Emphysema   . High  cholesterol   . Stroke ([place])   . Sinus trouble   . Seasonal allergies   . Sleep apnea   . Disorder of nervous system   . Myocardial infarct ([place])   . Complication of anesthesia     decreased bp, decreased heart rate    Past Surgical History: Past Surgical History  Procedure Laterality Date  . Abdominal hysterectomy    . Cesarean section    . Hiatal hernia repair      Social History: Social History  Substance Use Topics  . Smoking status: Current Every Day Smoker -- 0.50 packs/day for 37 years    Types: Cigarettes    Start date: 01/16/1977  . Smokeless tobacco: Never Used     Comment: Previous 2 PPD menthol full flavor  . Alcohol Use: No   Additional social history: Lives with son. Works as a Scientist, product/process development. Smoking 1 ppd.  Please also refer to relevant sections of EMR.   Family History: Family History  Problem Relation Age of Onset  . Cancer Father     prostate  . Heart failure      cousin  . Cancer Mother     lung  . Depression Mother   . Depression Sister   . Anxiety disorder Sister   . Schizophrenia Sister   . Bipolar disorder Sister   . Depression Sister   . Depression Brother    Allergies and Medications: Allergies  Allergen Reactions  . Hydroxyzine Other (See Comments)    unknown  . Codeine Nausea And Vomiting  . Sulfa Antibiotics Itching   No current facility-administered medications on file prior to encounter.   Current Outpatient Prescriptions on File Prior to Encounter  Medication Sig Dispense Refill  . ACCU-CHEK SOFTCLIX LANCETS lancets     . albuterol (PROVENTIL HFA;VENTOLIN HFA) 108 (90 BASE) MCG/ACT inhaler Inhale 2 puffs into the lungs every 6 (six) hours as needed. 1 Inhaler 6  . albuterol (PROVENTIL) (2.5 MG/3ML) 0.083% nebulizer solution Take 3 mLs (2.5 mg total) by nebulization every 6 (six) hours as needed for wheezing or shortness of breath. 150 mL 1  . amLODipine (NORVASC) 5 MG tablet Take 2 tablets (10 mg total) by mouth daily. 30 tablet  2  . aspirin 81 MG EC tablet TAKE 1 TABLET (81 MG TOTAL) BY MOUTH DAILY. 30 tablet 0  . atorvastatin (LIPITOR) 40 MG tablet Take 1 tablet (40 mg total) by mouth daily. 90 tablet 4  . Blood Glucose Monitoring Suppl (ACCU-CHEK AVIVA PLUS) w/Device KIT TEST FOUR TIMES DAILY 1 kit 0  . diphenhydrAMINE (BENADRYL) 25 mg capsule Take 50 mg by mouth every 6 (six) hours as needed.    [name]  escitalopram (LEXAPRO) 20 MG tablet Take 1 tablet (20 mg total) by mouth daily. 90 tablet 0  . fluticasone furoate-vilanterol (BREO ELLIPTA) 100-25 MCG/INH AEPB Inhale 1 puff into the lungs daily. 28 each 2  . furosemide (LASIX) 40 MG tablet Take 1 tablet (40 mg total) by mouth daily. 90 tablet 3  . gabapentin (NEURONTIN) 600 MG tablet Take 1 tablet (600 mg total) by mouth 3 (three) times daily. 270 tablet 4  . lisinopril (PRINIVIL,ZESTRIL) 40 MG tablet TAKE 1 TABLET EVERY DAY 90 tablet 0  . metFORMIN (GLUCOPHAGE-XR) 500 MG 24 hr tablet TAKE 2 TABLETS EVERY DAY  WITH  BREAKFAST 180 tablet 3  . mirtazapine (REMERON) 15 MG tablet Take 1 tablet (15 mg total) by mouth at bedtime. 90 tablet 0  . montelukast (SINGULAIR) 10 MG tablet Take 1 tablet (10 mg total) by mouth at bedtime. 90 tablet 3  . Multiple Vitamins-Minerals (MULTIVITAMIN WITH MINERALS) tablet Take 1 tablet by mouth daily.    . naproxen (NAPROSYN) 500 MG tablet Take 1 tablet (500 mg total) by mouth 3 (three) times daily as needed. 270 tablet 0  . omeprazole (PRILOSEC) 40 MG capsule Take 1 capsule (40 mg total) by mouth daily. 90 capsule 1  . oxybutynin (DITROPAN) 5 MG tablet Take 1 tablet (5 mg total) by mouth 3 (three) times daily. 270 tablet 3  . oxymetazoline (AFRIN) 0.05 % nasal spray Place 1 spray into both nostrils 2 (two) times daily.    . permethrin (ACTICIN) 5 % cream Apply 1 application topically once. Apply from head to soles of feet at bedtime, then wash off 8-14 hours after 60 g 0  . potassium chloride (K-DUR,KLOR-CON) 10 MEQ tablet TAKE 2 TABLETS EVERY  DAY 180 tablet 2  . terbinafine (LAMISIL) 250 MG tablet Take 1 tablet (250 mg total) by mouth daily. (Patient not taking: Reported on 05/14/2015) 30 tablet 1  . tiotropium (SPIRIVA HANDIHALER) 18 MCG inhalation capsule Place 1 capsule (18 mcg total) into inhaler and inhale daily. 90 capsule 4  . verapamil (VERELAN PM) 240 MG 24 hr capsule TAKE 2 CAPSULES AT BEDTIME 60 capsule 0  . ziprasidone (GEODON) 80 MG capsule Take 1 capsule (80 mg total) by mouth 2 (two) times daily with a meal. 180 capsule 0    Objective: BP 120/62 mmHg  Pulse 73  Temp(Src) 98.4 F (36.9 C) (Oral)  Resp 26  Wt 262 lb (118.842 kg)  SpO2 98% Exam: General: Tired-appearing, obese female, resting in bed Eyes: PERRLA, EOMI ENTM: Bipap in place. MM and lips dry.  Neck: Supple, could not assess for JVD due to habitus Cardiovascular: RRR, S1, S2, no m/r/g Respiratory: Diminished breath sounds over left posterior lung fields. Breath sounds appreciated over right posterior lung fields without crackles. Mild Expiratory wheezing appreciated over anterior lung fields. Talking slowly but in complete sentences with BIPAP on. No significant respiratory distress.  Abdomen: +BS, S, NT, ND MSK: Moves all extremities spontaneously. Trace pitting edema of bilateral shins. Skin: WWP. No rashes appreciated.  Neuro: AOx3 (though thought was June 7 not 6), no focal deficits Psych: Normal mood and affect.   Labs and Imaging: CBC BMET   Recent Labs Lab 06/21/15 2229  WBC 18.9*  HGB 10.8*  HCT 35.8*  PLT 499*    Recent Labs Lab 06/21/15 2229  NA 138  K 5.2*  CL 105  CO2 26  BUN 17  CREATININE 1.61*  GLUCOSE 99  CALCIUM 9.0     Dg  Chest Portable 1 View  06/21/2015  CLINICAL DATA:  Initial evaluation for acute shortness of breath. EXAM: PORTABLE CHEST 1 VIEW COMPARISON:  Prior radiograph from 02/14/2015. FINDINGS: Mild cardiomegaly is stable from previous. Mediastinal silhouette within normal limits. There are extensive  patchy opacities throughout the left mid and lower lung, concerning for pneumonia. A component of superimposed atelectasis may be present. Additional small focus of patchy opacity within the inferior right upper lobe noted. Right lung is otherwise grossly clear. Mild vascular congestion without overt pulmonary edema. No definite pleural effusion. No pneumothorax. No acute osseus abnormality. IMPRESSION: 1. Extensive opacity throughout the mid and lower left lung, concerning for pneumonia. 2. Grossly stable cardiomegaly with pulmonary vascular congestion without overt pulmonary edema. Electronically Signed   By: Jeannine Boga M.D.   On: 06/21/2015 22:34   Rogue Bussing, MD 06/22/2015, 1:17 AM PGY-1, [place] Intern pager: [phone], text pages welcome  Upper Level Addendum:  I have seen and evaluated this patient along with Tiffany. Ola Spurr and reviewed the above note, making necessary revisions in purple.  Nobie Putnam, [place], PGY-3

## 2015-06-23 LAB — GLUCOSE, CAPILLARY
Glucose-Capillary: 123 mg/dL — ABNORMAL HIGH (ref 65–99)
Glucose-Capillary: 127 mg/dL — ABNORMAL HIGH (ref 65–99)
Glucose-Capillary: 136 mg/dL — ABNORMAL HIGH (ref 65–99)
Glucose-Capillary: 147 mg/dL — ABNORMAL HIGH (ref 65–99)
Glucose-Capillary: 157 mg/dL — ABNORMAL HIGH (ref 65–99)
Glucose-Capillary: 175 mg/dL — ABNORMAL HIGH (ref 65–99)

## 2015-06-23 LAB — BASIC METABOLIC PANEL
Anion gap: 9 (ref 5–15)
BUN: 29 mg/dL — ABNORMAL HIGH (ref 6–20)
CO2: 24 mmol/L (ref 22–32)
Calcium: 9.2 mg/dL (ref 8.9–10.3)
Chloride: 108 mmol/L (ref 101–111)
Creatinine, Ser: 1.68 mg/dL — ABNORMAL HIGH (ref 0.44–1.00)
GFR calc Af Amer: 38 mL/min — ABNORMAL LOW (ref >60)
GFR calc non Af Amer: 33 mL/min — ABNORMAL LOW (ref >60)
Glucose, Bld: 154 mg/dL — ABNORMAL HIGH (ref 65–99)
Potassium: 5.3 mmol/L — ABNORMAL HIGH (ref 3.5–5.1)
Sodium: 141 mmol/L (ref 135–145)

## 2015-06-23 LAB — LEGIONELLA PNEUMOPHILA SEROGP 1 UR AG: L. pneumophila Serogp 1 Ur Ag: NEGATIVE

## 2015-06-23 NOTE — Progress Notes (Signed)
Pt is calm, 6L [place], VS stable. Noncompliant with diet. On clear liquids, but [place] solid food was seen consumed by patient. Education was given to patient. Verbalized understanding, but does not want to follow diet regiment. MD aware.

## 2015-06-23 NOTE — Plan of Care (Signed)
Problem: Education: Goal: Knowledge of [place] [facility] Education information/materials will improve Outcome: Not Progressing Pt verbalizing understanding, but noncompliant

## 2015-06-23 NOTE — Progress Notes (Signed)
PULMONARY / CRITICAL CARE MEDICINE   Name: Tiffany [name] MRN: [id number] DOB: [birth year]    ADMISSION DATE:  06/21/2015 CONSULTATION DATE:  06/22/2015  REFERRING MD:  FPTS  CHIEF COMPLAINT:  Cough  HISTORY OF PRESENT ILLNESS:    55 y/o female with asthma and obesity presented on 6/6 to the [facility] ER with cough.  She notes having a cough productive of yellow and red mucus for three days prior to admission associated with some mild dyspnea and chills.  She denied chest pain or any other pain.  She denied leg swelling.  In the ER she was noted to have severe hypoxemia so she was placed on BIPAP.  PCCM was consulted because of a persistent respiratory acidosis despite BIPAP.  Her primary care notes state she has COPD but unable to locate PFTs.  She tells me she continues to smoke 1ppd and has smoked since age 36.  She notes a 10 pounds weight loss in the last month. The respiratory therapist reports that the patient has been awake and breathing remarkably comfortable despite her low oxygen level.  PAST MEDICAL HISTORY :  She  has a past medical history of High blood pressure; Incontinence; Anxiety; Manic depression ([place]); Heart attack ([place]); Diabetes mellitus; Asthma; Emphysema; High cholesterol; Stroke ([place]); Sinus trouble; Seasonal allergies; Sleep apnea; Disorder of nervous system; Myocardial infarct [facility]); and Complication of anesthesia.  PAST SURGICAL HISTORY: She  has past surgical history that includes Abdominal hysterectomy; Cesarean section; and Hiatal hernia repair.  Allergies  Allergen Reactions  . Hydroxyzine Other (See Comments)    unknown  . Codeine Nausea And Vomiting  . Sulfa Antibiotics Itching    No current facility-administered medications on file prior to encounter.   Current Outpatient Prescriptions on File Prior to Encounter  Medication Sig  . ACCU-CHEK SOFTCLIX LANCETS lancets   . albuterol (PROVENTIL HFA;VENTOLIN HFA) 108 (90 BASE) MCG/ACT inhaler Inhale 2 puffs  into the lungs every 6 (six) hours as needed.  [name] albuterol (PROVENTIL) (2.5 MG/3ML) 0.083% nebulizer solution Take 3 mLs (2.5 mg total) by nebulization every 6 (six) hours as needed for wheezing or shortness of breath.  [name] amLODipine (NORVASC) 5 MG tablet Take 2 tablets (10 mg total) by mouth daily.  [name] aspirin 81 MG EC tablet TAKE 1 TABLET (81 MG TOTAL) BY MOUTH DAILY.  [name] atorvastatin (LIPITOR) 40 MG tablet Take 1 tablet (40 mg total) by mouth daily.  . Blood Glucose Monitoring Suppl (ACCU-CHEK AVIVA PLUS) w/Device KIT TEST FOUR TIMES DAILY  . diphenhydrAMINE (BENADRYL) 25 mg capsule Take 50 mg by mouth every 6 (six) hours as needed.  [name] escitalopram (LEXAPRO) 20 MG tablet Take 1 tablet (20 mg total) by mouth daily.  . fluticasone furoate-vilanterol (BREO ELLIPTA) 100-25 MCG/INH AEPB Inhale 1 puff into the lungs daily.  . furosemide (LASIX) 40 MG tablet Take 1 tablet (40 mg total) by mouth daily.  [name] gabapentin (NEURONTIN) 600 MG tablet Take 1 tablet (600 mg total) by mouth 3 (three) times daily.  [name] lisinopril (PRINIVIL,ZESTRIL) 40 MG tablet TAKE 1 TABLET EVERY DAY  . mirtazapine (REMERON) 15 MG tablet Take 1 tablet (15 mg total) by mouth at bedtime.  . montelukast (SINGULAIR) 10 MG tablet Take 1 tablet (10 mg total) by mouth at bedtime.  . Multiple Vitamins-Minerals (MULTIVITAMIN WITH MINERALS) tablet Take 1 tablet by mouth daily.  . naproxen (NAPROSYN) 500 MG tablet Take 1 tablet (500 mg total) by mouth 3 (three) times daily as needed.  [name]  omeprazole (PRILOSEC) 40 MG capsule Take 1 capsule (40 mg total) by mouth daily.  [name] oxybutynin (DITROPAN) 5 MG tablet Take 1 tablet (5 mg total) by mouth 3 (three) times daily.  [name] oxymetazoline (AFRIN) 0.05 % nasal spray Place 1 spray into both nostrils 2 (two) times daily.  . permethrin (ACTICIN) 5 % cream Apply 1 application topically once. Apply from head to soles of feet at bedtime, then wash off 8-14 hours after  . potassium chloride (K-DUR,KLOR-CON) 10 MEQ  tablet TAKE 2 TABLETS EVERY DAY  . terbinafine (LAMISIL) 250 MG tablet Take 1 tablet (250 mg total) by mouth daily.  [name] tiotropium (SPIRIVA HANDIHALER) 18 MCG inhalation capsule Place 1 capsule (18 mcg total) into inhaler and inhale daily.  . verapamil (VERELAN PM) 240 MG 24 hr capsule TAKE 2 CAPSULES AT BEDTIME  . ziprasidone (GEODON) 80 MG capsule Take 1 capsule (80 mg total) by mouth 2 (two) times daily with a meal.    FAMILY HISTORY:  Her indicated that her mother is alive. She indicated that her father is alive. She indicated that all of her three sisters are alive. She indicated that all of her three brothers are alive.   SOCIAL HISTORY: She  reports that she has been smoking Cigarettes.  She started smoking about 38 years ago. She has a 18.5 pack-year smoking history. She has never used smokeless tobacco. She reports that she does not drink alcohol or use illicit drugs.  REVIEW OF SYSTEMS:   Gen: Denies fever, chills, weight change, fatigue, night sweats HEENT: Denies blurred vision, double vision, hearing loss, tinnitus, sinus congestion, rhinorrhea, sore throat, neck stiffness, dysphagia PULM: per HPI CV: Denies chest pain, edema, orthopnea, paroxysmal nocturnal dyspnea, palpitations GI: Denies abdominal pain, nausea, vomiting, diarrhea, hematochezia, melena, constipation, change in bowel habits GU: Denies dysuria, hematuria, polyuria, oliguria, urethral discharge Endocrine: Denies hot or cold intolerance, polyuria, polyphagia or appetite change Derm: Denies rash, dry skin, scaling or peeling skin change Heme: Denies easy bruising, bleeding, bleeding gums Neuro: Denies headache, numbness, weakness, slurred speech, loss of memory or consciousness   SUBJECTIVE:  As above  VITAL SIGNS: BP 120/66 mmHg  Pulse 76  Temp(Src) 98.3 F (36.8 C) (Oral)  Resp 20  Ht '5\' 4"'  (1.626 m)  Wt 260 lb 2.3 oz (118 kg)  BMI 44.63 kg/m2  SpO2 100%  HEMODYNAMICS:    VENTILATOR  SETTINGS: Vent Mode:  [-]  FiO2 (%):  [80 %-100 %] 80 %  INTAKE / OUTPUT: I/O last 3 completed shifts: In: 2197.5 [P.O.:480; I.V.:1417.5; IV Piggyback:300] Out: 6568 [Urine:1845]  PHYSICAL EXAMINATION: General:  Obese female, awake and alert, No distress Neuro:  Alert and oriented x4, No focal deficits HEENT:  NCAT, Moist mucus membranes Cardiovascular:  RRR, no MRG Lungs:  B/L rhonchi. No wheeze Abdomen:  BS+, soft, nontender Musculoskeletal:  Normal bulk and tone Skin:  No rash or skin breakdown  LABS:  BMET  Recent Labs Lab 06/21/15 2229 06/22/15 0520  NA 138 137  K 5.2* 5.4*  CL 105 102  CO2 26 23  BUN 17 19  CREATININE 1.61* 1.76*  GLUCOSE 99 89    Electrolytes  Recent Labs Lab 06/21/15 2229 06/22/15 0520  CALCIUM 9.0 8.7*    CBC  Recent Labs Lab 06/21/15 2229 06/22/15 0520  WBC 18.9* 16.6*  HGB 10.8* 9.8*  HCT 35.8* 33.3*  PLT 499* 432*    Coag's No results for input(s): APTT, INR in the last 168 hours.  Sepsis  Markers  Recent Labs Lab 06/21/15 2315  LATICACIDVEN 1.35    ABG  Recent Labs Lab 06/21/15 2314 06/22/15 0350 06/22/15 0512  PHART 7.229* 7.184* 7.206*  PCO2ART 59.7* 64.4* 64.6*  PO2ART 67.0* 88.0 71.0*    Liver Enzymes No results for input(s): AST, ALT, ALKPHOS, BILITOT, ALBUMIN in the last 168 hours.  Cardiac Enzymes No results for input(s): TROPONINI, PROBNP in the last 168 hours.  Glucose  Recent Labs Lab 06/22/15 1144 06/22/15 1455 06/22/15 2025 06/22/15 2340 06/23/15 0414 06/23/15 1151  GLUCAP 102* 120* 135* 147* 136* 123*    Imaging CXR 6/5 >> lt lung opacities  STUDIES:   CULTURES: 6/5 blood culture 6/6 urine culture (ordered)  ANTIBIOTICS: 6/6 ceftriaxone >  6/6 azithromycin >  SIGNIFICANT EVENTS:   LINES/TUBES:   DISCUSSION: 55 y/o female smoker presented on 6/5 with acute respiratory failure with hypoxemia due to severe community acquired pneumonia and possibly a COPD  exacerbation (though no PFTs on record).  She has an acute respiratory acidosis but she is showing no sign of respiratory failure despite her hypoxemia.  [name] not tiring out and using no accessory muscles.     ASSESSMENT / PLAN:  PULMONARY A: Severe community acquired pneumonia Acute respiratory failure with hypercarbia and hypoxemia Suspicion for COPD with exacerbation Cough Tobacco abuse P:   Bipap as needed Use duoneb q6h here Solumedrol 1m IV q12 hr. Continue at current dose Brovana bid Pulmicort BID Off lisinopril  CARDIOVASCULAR A:  Hypertension Elevated proBNP> undetermined significance in setting of renal failure History of CAD P:  Continue ASA Continue home anti-hypertensives except lisinopril Tele monitoring  RENAL A:   Acute on chronic kidney failure Mild hyperkalemia P:   Monitor BMET and UOP Replace electrolytes as needed  GASTROINTESTINAL A:   No acute issues GERD P:   PO diet Continue home PPI  HEMATOLOGIC A:   Anemia without bleeding P:  Monitor for bleeding  INFECTIOUS A:   Severe CAP P:   Continue ceftriaxone, azithro F/u culture Follow legionella antigen. Urine strep neg  ENDOCRINE A:   DM2   P:   SSI q4h, resistant scale  NEUROLOGIC A:   Bipolar disorder P:   Continue home Geodon and SSRI and Remeron  FAMILY  - Updates: Pt updated at bedside 6/7 - Inter-disciplinary family meet or Palliative Care meeting due by:  day 6/13  [facility] Pager [phone] If no answer or after 3pm call: [phone] 06/23/2015, 12:57 PM

## 2015-06-23 NOTE — Plan of Care (Signed)
Problem: Activity: Goal: Risk for activity intolerance will decrease Outcome: Not Progressing DeSats with activity  Problem: Nutrition: Goal: Adequate nutrition will be maintained Outcome: Not Progressing Pt is noncompliant with diet.Education given

## 2015-06-23 NOTE — Progress Notes (Signed)
Family Medicine Teaching Service Social Note Intern Pager: [phone]  Patient name: Tiffany [name] Medical record number: [id number] Date of birth: 1960-11-04 Age: 55 y.o. Gender: female  Primary Care Provider: Kathrine Cords, MD Code Status: Full  S: Spoke with patient this morning who feels she is doing much better than on admission, though when she uses Bipap she is unsure if the machine is causing her to feel better rather than her breathing actually being improved. [name] asking about when she can go home.  O:  Afebrile overnight. On Non-rebreather mask overnight. SpO2 at 84% on 6L [place] earlier this morning.  A/P: On IV azithromycin and rocephin to treat severe PNA.  Appreciate the wonderful care CCM is provided. Family Medicine Service will gladly resume care when CCM deems appropriate.  Rogue Bussing, MD 06/23/2015, 9:53 AM PGY-1, [place] Intern pager: [phone], text pages welcome

## 2015-06-24 ENCOUNTER — Inpatient Hospital Stay (HOSPITAL_COMMUNITY): Payer: Commercial Managed Care - HMO

## 2015-06-24 ENCOUNTER — Telehealth: Payer: Self-pay | Admitting: *Deleted

## 2015-06-24 LAB — BLOOD GAS, ARTERIAL
Acid-base deficit: 0.3 mmol/L (ref 0.0–2.0)
Acid-base deficit: 0.5 mmol/L (ref 0.0–2.0)
Bicarbonate: 25.4 mEq/L — ABNORMAL HIGH (ref 20.0–24.0)
Bicarbonate: 25.5 mEq/L — ABNORMAL HIGH (ref 20.0–24.0)
Drawn by: 365271
Drawn by: 246101
O2 Content: 6 L/min
O2 Content: 5 L/min
O2 Saturation: 89.8 %
O2 Saturation: 88.1 %
Patient temperature: 98.6
Patient temperature: 98.6
TCO2: 27 mmol/L (ref 0–100)
TCO2: 27.3 mmol/L (ref 0–100)
pCO2 arterial: 53 mmHg — ABNORMAL HIGH (ref 35.0–45.0)
pCO2 arterial: 57.1 mmHg (ref 35.0–45.0)
pH, Arterial: 7.301 — ABNORMAL LOW (ref 7.350–7.450)
pH, Arterial: 7.272 — ABNORMAL LOW (ref 7.350–7.450)
pO2, Arterial: 63.6 mmHg — ABNORMAL LOW (ref 80.0–100.0)
pO2, Arterial: 64.4 mmHg — ABNORMAL LOW (ref 80.0–100.0)

## 2015-06-24 LAB — BASIC METABOLIC PANEL
Anion gap: 7 (ref 5–15)
Anion gap: 8 (ref 5–15)
BUN: 25 mg/dL — ABNORMAL HIGH (ref 6–20)
BUN: 26 mg/dL — ABNORMAL HIGH (ref 6–20)
CO2: 26 mmol/L (ref 22–32)
CO2: 25 mmol/L (ref 22–32)
Calcium: 9.1 mg/dL (ref 8.9–10.3)
Calcium: 9.4 mg/dL (ref 8.9–10.3)
Chloride: 105 mmol/L (ref 101–111)
Chloride: 107 mmol/L (ref 101–111)
Creatinine, Ser: 1.46 mg/dL — ABNORMAL HIGH (ref 0.44–1.00)
Creatinine, Ser: 1.47 mg/dL — ABNORMAL HIGH (ref 0.44–1.00)
GFR calc Af Amer: 45 mL/min — ABNORMAL LOW (ref >60)
GFR calc Af Amer: 45 mL/min — ABNORMAL LOW (ref >60)
GFR calc non Af Amer: 39 mL/min — ABNORMAL LOW (ref >60)
GFR calc non Af Amer: 39 mL/min — ABNORMAL LOW (ref >60)
Glucose, Bld: 108 mg/dL — ABNORMAL HIGH (ref 65–99)
Glucose, Bld: 107 mg/dL — ABNORMAL HIGH (ref 65–99)
Potassium: 5.2 mmol/L — ABNORMAL HIGH (ref 3.5–5.1)
Potassium: 5.4 mmol/L — ABNORMAL HIGH (ref 3.5–5.1)
Sodium: 138 mmol/L (ref 135–145)
Sodium: 140 mmol/L (ref 135–145)

## 2015-06-24 LAB — CBC
HCT: 32.1 % — ABNORMAL LOW (ref 36.0–46.0)
HCT: 33.8 % — ABNORMAL LOW (ref 36.0–46.0)
Hemoglobin: 9.4 g/dL — ABNORMAL LOW (ref 12.0–15.0)
Hemoglobin: 10 g/dL — ABNORMAL LOW (ref 12.0–15.0)
MCH: 27.7 pg (ref 26.0–34.0)
MCH: 27.7 pg (ref 26.0–34.0)
MCHC: 29.3 g/dL — ABNORMAL LOW (ref 30.0–36.0)
MCHC: 29.6 g/dL — ABNORMAL LOW (ref 30.0–36.0)
MCV: 94.7 fL (ref 78.0–100.0)
MCV: 93.6 fL (ref 78.0–100.0)
Platelets: 478 10*3/uL — ABNORMAL HIGH (ref 150–400)
Platelets: 464 10*3/uL — ABNORMAL HIGH (ref 150–400)
RBC: 3.39 MIL/uL — ABNORMAL LOW (ref 3.87–5.11)
RBC: 3.61 MIL/uL — ABNORMAL LOW (ref 3.87–5.11)
RDW: 16.1 % — ABNORMAL HIGH (ref 11.5–15.5)
RDW: 16.1 % — ABNORMAL HIGH (ref 11.5–15.5)
WBC: 19.8 10*3/uL — ABNORMAL HIGH (ref 4.0–10.5)
WBC: 20.2 10*3/uL — ABNORMAL HIGH (ref 4.0–10.5)

## 2015-06-24 LAB — PHOSPHORUS: Phosphorus: 2.2 mg/dL — ABNORMAL LOW (ref 2.5–4.6)

## 2015-06-24 LAB — GLUCOSE, CAPILLARY
Glucose-Capillary: 105 mg/dL — ABNORMAL HIGH (ref 65–99)
Glucose-Capillary: 111 mg/dL — ABNORMAL HIGH (ref 65–99)
Glucose-Capillary: 116 mg/dL — ABNORMAL HIGH (ref 65–99)
Glucose-Capillary: 125 mg/dL — ABNORMAL HIGH (ref 65–99)
Glucose-Capillary: 129 mg/dL — ABNORMAL HIGH (ref 65–99)
Glucose-Capillary: 138 mg/dL — ABNORMAL HIGH (ref 65–99)
Glucose-Capillary: 208 mg/dL — ABNORMAL HIGH (ref 65–99)

## 2015-06-24 LAB — MAGNESIUM: Magnesium: 2.2 mg/dL (ref 1.7–2.4)

## 2015-06-24 MED ORDER — BENZONATATE 100 MG PO CAPS
100.0000 mg | ORAL_CAPSULE | Freq: Three times a day (TID) | ORAL | Status: DC | PRN
  Filled 2015-06-24: qty 1

## 2015-06-24 MED ORDER — ENOXAPARIN SODIUM 60 MG/0.6ML ~~LOC~~ SOLN: 60.0000 mg | SUBCUTANEOUS | Status: DC

## 2015-06-24 MED ORDER — DEXTROSE 5 % IV SOLN
2.0000 g | INTRAVENOUS | Status: DC
  Administered 2015-06-24: 2 g via INTRAVENOUS
  Filled 2015-06-24 (×2): qty 2

## 2015-06-24 MED ORDER — HYDRALAZINE HCL 20 MG/ML IJ SOLN: 2.0000 mg | Freq: Four times a day (QID) | INTRAMUSCULAR | Status: DC | PRN

## 2015-06-24 NOTE — Telephone Encounter (Signed)
Received faxed request from [facility] for clarification on verapamil rx written on 06/21/2015: "Rx for verapamil ER 240 mg cap, and patient also has active Rx for Amlodipine 5 mg. Please clarify if current therapy is verapamil ER 240 or amlodipine 5 mg." Forwarded to Dr. Lorenso Courier for review DUCATTE, Orvis Brill, RN

## 2015-06-24 NOTE — Care Management Note (Signed)
Case Management Note  Patient Details  Name: AMMANDA AFFLECK MRN: [id number] Date of Birth: 1960-11-30  Subjective/Objective:      Pt admitted with CAP              Action/Plan:  PTA - independent from home with family - uses can to assist with ambulation.  CM will continue to monitor for discharge needs   Expected Discharge Date:                  Expected Discharge Plan:  Home/Self Care  In-House Referral:     Discharge planning Services  CM Consult  Post Acute Care Choice:    Choice offered to:     DME Arranged:    DME Agency:     HH Arranged:    HH Agency:     Status of Service:  In process, will continue to follow  Medicare Important Message Given:  Yes Date Medicare IM Given:    Medicare IM give by:    Date Additional Medicare IM Given:    Additional Medicare Important Message give by:     If discussed at [place] of Stay Meetings, dates discussed:    Additional Comments:  Maryclare Labrador, RN 06/24/2015, 2:24 PM

## 2015-06-24 NOTE — Progress Notes (Signed)
Transferred in patient from 39M, while the patient was being transported along the unit's hallway going to her new room,charged nurse received a critical panic result of her ABG from the lab.M.D. Made aware.Rapid response called and was at the patient's bedside.Patient was not in distressed.M.D. ordered her to be transferred back either Step-down bed or intensive care bed.

## 2015-06-24 NOTE — Progress Notes (Signed)
Patient transferred to [facility] by the charge nurse and nurse tech.Patient stable at this time.Not complaining of anything.

## 2015-06-24 NOTE — Progress Notes (Signed)
Family Medicine Teaching Service Daily Progress Note Intern Pager: [phone]  Patient name: Tiffany [name] Medical record number: [id number] Date of birth: February 15, 1960 Age: 55 y.o. Gender: female  Primary Care Provider: Kathrine Cords, MD Consultants: PCCM Code Status: Full (confirmed on admission)  Pt Overview and Major Events to Date:  6/5: Rocephin given.  6/7: First dose IV azithromycin given. Admitted and transferred to [facility] service due to possible intubation risk.  6/8: Transfer to floor bed  Assessment and Plan: Tiffany [name] is a 55 y.o. female presenting with acute respiratory failure. PMH is significant for HTN, COPD, asthma, T2DM, HLD, depression, bipolar 1, obesity, tobacco abuse, remote history of suspected MI, OSA and seizures.  Acute on chronic respiratory failure on BIPAP, likely due to CAP, in setting of COPD Suggestive of CAP, given leukocytosis to 18.9 and CXR consistent with large left-sided opacity.  Patient has been intubated previously in setting of pneumonia (Jan 2012). Requiring bipap to maintain O2 saturations on admission. ABG with pH of 7.229, pCO2 59.7, pO2 67.0, bicarb 24.9, O2 sat 88, consistent with respiratory acidosis with metabolic compensation. Baseline pCO2 unknown. Patient tired but mentating appropriately. She was transferred to [facility] shortly after admission given risk for intubation. Required bipap initially. Now on 4L O2 by nasal canula. Required 6L O2 overnight. ABGs improving. WBC elevated at 20.2 6/8 but on steroids.  - Continue rocephin and IV azithromycin q24 hours. Plan to discontinue rocephin once patient's oxygen requirement is reduced. IV azithromycin should be continued for at least 2 days then switched to PO for total treatment course of at least 7 days.  - Maintain SpO2 above 92% - Repeat ABG this afternoon - Continue home COPD medications but replace prn albuterol with scheduled q6h duonebs - Blood cultures x 2 obtained, NGTD - UA and  urine cx ordered, NGTD - Continue methylprednisolone 60 mg q12h - CXR 6/8 improved, but will obtain PA lateral and left lateral decubitus 6/9 to rule-out effusion given severity of SOB on admission - PT ordered. Will likely need supplemental O2 upon discharge.   COPD, with hypercapnia and possible acute exacerbation. Improving. Last treated for exacerbation 02/14/15.  - Continue home breo-ellipta 100-25 1 puff daily - Continue home spiriva once daily - Scheduled duonebs as above while hospitalized   Bloody sputum: Resolved. In setting of PNA with cough. Hgb stable at 10.8 on admission (Baseline ~11.5) - Monitor daily CBCs - Ordered tessalon capsules prn  Tobacco abuse: Has never tried to quit.  - Continues to smoke daily, > 20 year pack year history.  - Ask if she needs nicotine patch while hospitalized  AKI: Scr 1.47 < 1.61. (Baseline ~1 ) May be due to hypoperfusion 2/2 hypoxemia though LA < 2 - Monitor with daily BMPs  Chronic Diastolic (Grd 1) CHF with HFpEF: Do not suspect acute exacerbation. Last ECHO 07/08/14, showing EF 55-60%. BNP elevated at 261.8 but likely 2/2 increased metabolic demand in setting of infection.  - Hold home lasix 40 mg daily due to AKI and as appears euvolemic/hypovolemic  HTN: Normotensive on admission (monitor closely as usually somewhat elevated -- could indicate developing sepsis) - Continue home amlodipine 10 mg and verapamil 480 mg daily.  - Hold lisinopril 40 mg daily for AKI  CAD risk, HLD: Denies chest pain. I-stat troponin 0.02 in the ED. EKG unchanged from prior.  - Continue daily asa 81 mg - Continue lipitor 40 mg daily  T2DM: Well controlled. Last hgb A1c 5.6 on 06/01/15 -  Hold home metformin - Monitor CGBs q4h - Not on insulin at home. No SSI at this time.   OSA: - Refuses CPAP  Mood disorder: Followed by Dr. [name] - Continue lexapro 20 mg daily, remeron 15 mg qhs, and geodon 80 mg BID  Hx seizures and neuropathy: - Continue  home gabapentin 600mg  TID   FEN/GI: Heart Healthy/Carb Modified, SLIV Prophylaxis: lovenox SQ  Disposition: Transition to floor as stable off BIPAP and tolerating supplemental O2, treating CAP with IV antibiotics, transition to PO as tolerated. Anticipated hospital stay 1-2 more days.   Subjective:  Patient feeling much improved and interested in going home.   Objective: Temp:  [97.4 F (36.3 C)-98.3 F (36.8 C)] 97.4 F (36.3 C) (06/08 0324) Pulse Rate:  [58-91] 67 (06/08 0600) Resp:  [18-44] 20 (06/08 0600) BP: (85-148)/(58-99) 93/67 mmHg (06/08 0600) SpO2:  [90 %-100 %] 99 % (06/08 0600) Weight:  [262 lb 5.6 oz (119 kg)] 262 lb 5.6 oz (119 kg) (06/08 0349) Physical Exam: Exam: General: Tired-appearing, obese female, sitting up in recliner with [place] in place Neck: Supple, could not assess for JVD due to habitus Cardiovascular: RRR, S1, S2, no m/r/g Respiratory: Mild expiratory wheezes with crackles throughout. No increased WOB noted.   Abdomen: +BS, S, NT, ND MSK: Moves all extremities spontaneously. Trace pitting edema of bilateral shins. Skin: WWP. No rashes appreciated.  Neuro: AOx3, no focal deficits Psych: Normal mood and affect.   Laboratory:  Recent Labs Lab 06/21/15 2229 06/22/15 0520 06/24/15 0221  WBC 18.9* 16.6* 19.8*  HGB 10.8* 9.8* 9.4*  HCT 35.8* 33.3* 32.1*  PLT 499* 432* 478*    Recent Labs Lab 06/22/15 0520 06/23/15 1438 06/24/15 0221  NA 137 141 138  K 5.4* 5.3* 5.2*  CL 102 108 105  CO2 23 24 26   BUN 19 29* 25*  CREATININE 1.76* 1.68* 1.46*  CALCIUM 8.7* 9.2 9.1  GLUCOSE 89 154* 108*    Imaging/Diagnostic Tests: Dg Chest Portable 1 View  06/21/2015  CLINICAL DATA:  Initial evaluation for acute shortness of breath. EXAM: PORTABLE CHEST 1 VIEW COMPARISON:  Prior radiograph from 02/14/2015. FINDINGS: Mild cardiomegaly is stable from previous. Mediastinal silhouette within normal limits. There are extensive patchy opacities throughout the  left mid and lower lung, concerning for pneumonia. A component of superimposed atelectasis may be present. Additional small focus of patchy opacity within the inferior right upper lobe noted. Right lung is otherwise grossly clear. Mild vascular congestion without overt pulmonary edema. No definite pleural effusion. No pneumothorax. No acute osseus abnormality. IMPRESSION: 1. Extensive opacity throughout the mid and lower left lung, concerning for pneumonia. 2. Grossly stable cardiomegaly with pulmonary vascular congestion without overt pulmonary edema. Electronically Signed   By: Jeannine Boga M.D.   On: 06/21/2015 22:34    Rogue Bussing, MD 06/24/2015, 7:01 AM PGY-1, [place] Intern pager: [phone], text pages welcome

## 2015-06-24 NOTE — Care Management Important Message (Signed)
Important Message  Patient Details  Name: Tiffany [name] MRN: [id number] Date of Birth: 10-10-1960   Medicare Important Message Given:  Yes    Aala Ransom Abena 06/24/2015, 10:20 AM

## 2015-06-24 NOTE — Progress Notes (Signed)
CRITICAL VALUE ALERT  Critical value received:  Critical ABG's on 5L O2 n/c. Ph 7.27, panic PCO2 57.1, PO2 64, base excess -0.5, bicarb 25.5.  Date of notification:  06/24/15  Time of notification:  1532  Critical value read back:Yes.    Nurse who received alert:  Larena Glassman, RN  MD notified (1st page):  Dr. Brett Albino  Time of first page:  1533  MD notified (2nd page):  Time of second page:  Responding MD:  Dr. Brett Albino  Time MD responded:  1534    Patient's primary nurse Gwenlyn Perking also notified.  Joellen Jersey, RN.

## 2015-06-24 NOTE — Progress Notes (Signed)
RT attempted to place patient on BIPAP, she said she is not ready for bed yet and she will call back when she is ready to wear it. RT explained to patient that the BIPAP is not for sleep apnea but to help her with her shortness of breath. She still refused to wear it at this time.

## 2015-06-25 ENCOUNTER — Inpatient Hospital Stay (HOSPITAL_COMMUNITY): Payer: Commercial Managed Care - HMO

## 2015-06-25 ENCOUNTER — Encounter (HOSPITAL_COMMUNITY): Payer: Self-pay | Admitting: General Practice

## 2015-06-25 DIAGNOSIS — J441 Chronic obstructive pulmonary disease with (acute) exacerbation: Secondary | ICD-10-CM

## 2015-06-25 DIAGNOSIS — E118 Type 2 diabetes mellitus with unspecified complications: Secondary | ICD-10-CM

## 2015-06-25 LAB — BLOOD GAS, ARTERIAL
Acid-Base Excess: 2.6 mmol/L — ABNORMAL HIGH (ref 0.0–2.0)
Bicarbonate: 27.5 mEq/L — ABNORMAL HIGH (ref 20.0–24.0)
Delivery systems: POSITIVE
Drawn by: 449561
Expiratory PAP: 6
FIO2: 0.4
Inspiratory PAP: 14
Mode: POSITIVE
O2 Saturation: 93 %
Patient temperature: 98.6
TCO2: 29 mmol/L (ref 0–100)
pCO2 arterial: 49.3 mmHg — ABNORMAL HIGH (ref 35.0–45.0)
pH, Arterial: 7.365 (ref 7.350–7.450)
pO2, Arterial: 70.4 mmHg — ABNORMAL LOW (ref 80.0–100.0)

## 2015-06-25 LAB — CBC
HCT: 33.6 % — ABNORMAL LOW (ref 36.0–46.0)
Hemoglobin: 10 g/dL — ABNORMAL LOW (ref 12.0–15.0)
MCH: 27.7 pg (ref 26.0–34.0)
MCHC: 29.8 g/dL — ABNORMAL LOW (ref 30.0–36.0)
MCV: 93.1 fL (ref 78.0–100.0)
Platelets: 537 10*3/uL — ABNORMAL HIGH (ref 150–400)
RBC: 3.61 MIL/uL — ABNORMAL LOW (ref 3.87–5.11)
RDW: 16.2 % — ABNORMAL HIGH (ref 11.5–15.5)
WBC: 18.9 10*3/uL — ABNORMAL HIGH (ref 4.0–10.5)

## 2015-06-25 LAB — BASIC METABOLIC PANEL
Anion gap: 9 (ref 5–15)
BUN: 28 mg/dL — ABNORMAL HIGH (ref 6–20)
CO2: 26 mmol/L (ref 22–32)
Calcium: 9.4 mg/dL (ref 8.9–10.3)
Chloride: 105 mmol/L (ref 101–111)
Creatinine, Ser: 1.48 mg/dL — ABNORMAL HIGH (ref 0.44–1.00)
GFR calc Af Amer: 45 mL/min — ABNORMAL LOW (ref >60)
GFR calc non Af Amer: 38 mL/min — ABNORMAL LOW (ref >60)
Glucose, Bld: 107 mg/dL — ABNORMAL HIGH (ref 65–99)
Potassium: 4.9 mmol/L (ref 3.5–5.1)
Sodium: 140 mmol/L (ref 135–145)

## 2015-06-25 LAB — GLUCOSE, CAPILLARY
Glucose-Capillary: 108 mg/dL — ABNORMAL HIGH (ref 65–99)
Glucose-Capillary: 107 mg/dL — ABNORMAL HIGH (ref 65–99)
Glucose-Capillary: 81 mg/dL (ref 65–99)

## 2015-06-25 MED ORDER — LEVOFLOXACIN 750 MG PO TABS: 750.0000 mg | ORAL_TABLET | Freq: Every day | ORAL | Status: DC

## 2015-06-25 MED ORDER — PREDNISONE 20 MG PO TABS: 40.0000 mg | ORAL_TABLET | Freq: Every day | ORAL | Status: DC

## 2015-06-25 MED ORDER — PREDNISONE 50 MG PO TABS: 50.0000 mg | ORAL_TABLET | Freq: Every day | ORAL | Status: DC

## 2015-06-25 MED ORDER — BENZONATATE 100 MG PO CAPS: 100.0000 mg | ORAL_CAPSULE | Freq: Three times a day (TID) | ORAL | Status: DC | PRN

## 2015-06-25 NOTE — Progress Notes (Signed)
Family Medicine Teaching Service Daily Progress Note Intern Pager: [phone]  Patient name: Tiffany [name] Medical record number: [id number] Date of birth: March 20, 1960 Age: 55 y.o. Gender: female  Primary Care Provider: Kathrine Cords, MD Consultants: PCCM Code Status: Full (confirmed on admission)  Pt Overview and Major Events to Date:  6/5: Rocephin given.  6/7: First dose IV azithromycin given. Admitted and transferred to [facility] service due to possible intubation risk.  6/8: Transfer to floor bed  Assessment and Plan: Tiffany [name] is a 55 y.o. female presenting with acute respiratory failure. PMH is significant for HTN, COPD, asthma, T2DM, HLD, depression, bipolar 1, obesity, tobacco abuse, remote history of suspected MI, OSA and seizures.  Acute on chronic respiratory failure on BIPAP, likely due to CAP, in setting of COPD Suggestive of CAP, given leukocytosis to 18.9 and CXR consistent with large left-sided opacity.  Patient has been intubated previously in setting of pneumonia (Jan 2012). Admitted with respiratory acidosis with metabolic compensation. Baseline pCO2 unknown. She was transferred to [facility] shortly after admission given risk for intubation. Required bipap initially. Was on [place] over the course of 6/8 but required intermittent bipap overnight for increasing pCO2. ABG this a.m. showed improvement in pO2 and pCO2. WBC elevated at but on steroids. She lost IV access overnight and is refusing restarting IV meds. - Has received 4 doses of IV rochephin, 3 doses of IV azithromycin, and 3 days of solumedrol.  - Start PO levaquin 750 mg x 5 days. - Start prednisone 50 mg x 5 days.  - Maintain SpO2 above 92% - Continue home COPD medications but replace prn albuterol with scheduled q6h duonebs - Blood cultures x 2 obtained, NGTD - UA and urine cx ordered, NGTD - CXR 6/8 improved, but will obtain PA lateral and left lateral decubitus 6/9 to rule-out effusion given severity of SOB on  admission - PT ordered. Will likely need supplemental O2 upon discharge. --> 88% on RA just laying in bed  COPD, with hypercapnia and possible acute exacerbation. Improving. Last treated for exacerbation 02/14/15.  - Continue home breo-ellipta 100-25 1 puff daily - Continue home spiriva once daily - Scheduled duonebs as above while hospitalized   Bloody sputum: Resolved. In setting of PNA with cough. Hgb stable at 10.8 on admission (Baseline ~11.5) - Monitor daily CBCs - Ordered tessalon capsules prn  Tobacco abuse: Has never tried to quit.  - Continues to smoke daily, > 20 year pack year history.   AKI: Scr 1.48 < 1.47 < 1.61. (Baseline ~1 ) May be due to hypoperfusion 2/2 hypoxemia though LA < 2 - Monitor with daily BMPs  Chronic Diastolic (Grd 1) CHF with HFpEF: Do not suspect acute exacerbation. Last ECHO 07/08/14, showing EF 55-60%. BNP elevated at 261.8 but likely 2/2 increased metabolic demand in setting of infection.  - Restart home lasix 40 mg daily (held for AKI) for elevated BP  HTN: Normotensive on admission (monitor closely as usually somewhat elevated -- could indicate developing sepsis) - Continue home amlodipine 10 mg and verapamil 480 mg daily.  - Restart lisinopril 40 mg and lasix  CAD risk, HLD: Denies chest pain. I-stat troponin 0.02 in the ED. EKG unchanged from prior.  - Continue daily asa 81 mg - Continue lipitor 40 mg daily  T2DM: Well controlled. Last hgb A1c 5.6 on 06/01/15 - Hold home metformin - Monitor CGBs q4h - Not on insulin at home. No SSI at this time.   OSA: - Uses CPAP at  home  Mood disorder: Followed by Dr. [name] - Continue lexapro 20 mg daily, remeron 15 mg qhs, and geodon 80 mg BID  Hx seizures and neuropathy: - Continue home gabapentin 600mg  TID   FEN/GI: Heart Healthy/Carb Modified, SLIV Prophylaxis: lovenox SQ  Disposition: Recommending hospital stay 1-2 more days, though patient is adamant about leaving today.    Subjective:  Patient feeling much improved and interested in going home because she is concerned about her teenage son being alone without her.   Objective: Temp:  [98.1 F (36.7 C)-99 F (37.2 C)] 99 F (37.2 C) (06/09 0343) Pulse Rate:  [60-89] 60 (06/09 0500) Resp:  [13-26] 21 (06/09 0500) BP: (124-183)/(70-100) 138/80 mmHg (06/09 0500) SpO2:  [91 %-99 %] 91 % (06/09 0500) FiO2 (%):  [40 %] 40 % (06/09 0409) Weight:  [261 lb 0.4 oz (118.4 kg)-261 lb 14.4 oz (118.797 kg)] 261 lb 14.4 oz (118.797 kg) (06/09 0343) Physical Exam: Exam: General: Tired-appearing, obese female, sitting in bed with [place] in place Neck: Supple, could not assess for JVD due to habitus Cardiovascular: RRR, S1, S2, no m/r/g Respiratory: Mild expiratory wheezes with crackles throughout. No increased WOB noted.   Abdomen: +BS, S, NT, ND MSK: Moves all extremities spontaneously. Trace pitting edema of bilateral shins. Skin: WWP. No rashes appreciated.  Neuro: AOx3, no focal deficits Psych: Normal mood and affect.   Laboratory:  Recent Labs Lab 06/24/15 0221 06/24/15 0847 06/25/15 0406  WBC 19.8* 20.2* 18.9*  HGB 9.4* 10.0* 10.0*  HCT 32.1* 33.8* 33.6*  PLT 478* 464* 537*    Recent Labs Lab 06/24/15 0221 06/24/15 0847 06/25/15 0406  NA 138 140 140  K 5.2* 5.4* 4.9  CL 105 107 105  CO2 26 25 26   BUN 25* 26* 28*  CREATININE 1.46* 1.47* 1.48*  CALCIUM 9.1 9.4 9.4  GLUCOSE 108* 107* 107*    Imaging/Diagnostic Tests: Dg Chest Port 1 View  06/24/2015  CLINICAL DATA:  Acute respiratory failure, community-acquired pneumonia, history of asthma, diabetes EXAM: PORTABLE CHEST 1 VIEW COMPARISON:  Portable chest x-ray of June 21, 2015 FINDINGS: There is improved aeration of the left lung. There is persistent retrocardiac density in obscuration of the left hemidiaphragm. There is right basilar atelectasis or infiltrate. The cardiac silhouette remains enlarged. The central pulmonary vascularity remains  prominent. There is no pneumothorax. IMPRESSION: Decreasing atelectasis or pneumonia in the left lung. Persistent interstitial infiltrates or mild asymmetric pulmonary edema bilaterally. Electronically Signed   By: David  Martinique M.D.   On: 06/24/2015 08:01   Dg Chest Portable 1 View  06/21/2015  CLINICAL DATA:  Initial evaluation for acute shortness of breath. EXAM: PORTABLE CHEST 1 VIEW COMPARISON:  Prior radiograph from 02/14/2015. FINDINGS: Mild cardiomegaly is stable from previous. Mediastinal silhouette within normal limits. There are extensive patchy opacities throughout the left mid and lower lung, concerning for pneumonia. A component of superimposed atelectasis may be present. Additional small focus of patchy opacity within the inferior right upper lobe noted. Right lung is otherwise grossly clear. Mild vascular congestion without overt pulmonary edema. No definite pleural effusion. No pneumothorax. No acute osseus abnormality. IMPRESSION: 1. Extensive opacity throughout the mid and lower left lung, concerning for pneumonia. 2. Grossly stable cardiomegaly with pulmonary vascular congestion without overt pulmonary edema. Electronically Signed   By: Jeannine Boga M.D.   On: 06/21/2015 22:34    Rogue Bussing, MD 06/25/2015, 7:18 AM PGY-1, [facility] Intern pager: [phone], text pages welcome

## 2015-06-25 NOTE — Consult Note (Signed)
   [facility] Inpatient Consult   06/25/2015  Tiffany [name] 1960-07-28 [id number]  Patient is currently active [prior to [place]  with [facility] for chronic disease management services. Patient is a 55 y.o. female with a past medical history of HTN, COPD, asthma, DM, HLD, depression, bipolar 1, and seizures. Patient has been engaged by a Research scientist (medical) and previously with [facility]. Met with the patient who endorses on going care management follow up. Active consent on file.   Our community based plan of care has focused on disease management and community resource support.  Patient will receive a post discharge transition of care call and will be evaluated for monthly home visits for assessments and disease process education.  Made Inpatient Case Manager aware that [facility] following. Of note, [facility] Care Management services does not replace or interfere with any services that are needed or arranged by inpatient case management or social work.  For additional questions or referrals please contact:  Natividad Brood, RN BSN [facility] Liaison  [phone] business mobile phone Toll free office [phone]

## 2015-06-25 NOTE — Progress Notes (Signed)
Upon entering pts room this am pt requests to leave AMA, states she has been here long enough and has already called son to come and pick her up, explained to pt that she was still on 02 @5  liters and that her SATs were staying in the low 90s with activity, asked if someone could bring her an 02 tank, states she doesn't use 02 at home, explained to pt that with out 02 she may end up coming back to emergency room, and that if she left AMA we wouldn't be able to qualify her for supplemental 02,  Asked pt if she would allow PT to walk with her with and without 02, pt agreed, MD notified, pulmonary MD also visited with pt in room, pt agreed to stay long enough to get the meds she needs and qualify for 02, dressed and sitting calmly in chair at present, will continue to monitor closely.  Edward Qualia RN

## 2015-06-25 NOTE — Progress Notes (Signed)
SATURATION QUALIFICATIONS: (This note is used to comply with regulatory documentation for home oxygen)  Patient Saturations on Room Air at Rest = 87%  Patient Saturations on Room Air while Ambulating = 78%  Patient Saturations on 5 Liters of oxygen while Ambulating = 90%  Please briefly explain why patient needs home oxygen: oxygen saturation drops 90% at rest, needs supplemental oxygen to maintain SATs.

## 2015-06-25 NOTE — Progress Notes (Signed)
Patient now agreeable to Bipap (Black box; autobipap) at this time.  Placed on mask without complaint.  Patient continues to refuse ABG despite discussion of importance.  Patient is non-distressed at this time and conversive.  RT will continue to monitor.

## 2015-06-25 NOTE — Evaluation (Signed)
Physical Therapy Evaluation Patient Details Name: Tiffany [name] MRN: [id number] DOB: 03/23/1960 Today's Date: 06/25/2015   History of Present Illness  55 y.o. female admitted to [facility] on 06/21/15 with SOB.  Pt dx with Acute on chronic respiratory failure, CAP in the setting of COPD.  Pt with significant PMHx of HTN, MI, DM, asthma, emphysema, and stroke.  Clinical Impression  Pt is SOB and not able to maintain O2 sats on RA.  See separate O2 sat note.  Pt is independent with all of her mobility and educated on pursed lip breathing and taking multiple rest breaks to regain her breathing when active.  PT to sign off as she is independent with her mobility, but does seem to need supplemental O2 at discharge.  She may also benefit from outpatient pulmonary rehab program if she is agreeable.     Follow Up Recommendations No PT follow up;Other (comment) (might benefit from outpatient pulmonary rehab program)    Equipment Recommendations  Other (comment) (home O2)    Recommendations for Other Services   NA    Precautions / Restrictions Precautions Precautions: Other (comment) Precaution Comments: monitor O2 sats with mobility      Mobility                    Transfers Overall transfer level: Independent                  Ambulation/Gait Ambulation/Gait assistance: Independent Ambulation Distance (Feet): 300 Feet       Gait velocity interpretation: at or above normal speed for age/gender General Gait Details: Pt desated on RA and it took 23 min standing rest break and 5 L O2 [place] to get O2 back into 90s.  Multiple (3) standing rest breaks needed to maintain sats at 90 with 5 L O2 [place] during gait.           Balance Overall balance assessment: Independent                                           Pertinent Vitals/Pain Pain Assessment: No/denies pain    Home Living Family/patient expects to be discharged to:: Private residence Living  Arrangements: Alone Available Help at Discharge: Friend(s);Available PRN/intermittently Type of Home: House       Home Layout: One level   Additional Comments: not on O2 at baseline    Prior Function Level of Independence: Independent         Comments: retired, does not have to do yard work        Extremity/Trunk Assessment   Upper Extremity Assessment: Overall WFL for tasks assessed           Lower Extremity Assessment: Overall WFL for tasks assessed      Cervical / Trunk Assessment: Normal  Communication   Communication: No difficulties  Cognition Arousal/Alertness: Awake/alert Behavior During Therapy: WFL for tasks assessed/performed Overall Cognitive Status: Within Functional Limits for tasks assessed                               Assessment/Plan    PT Assessment [place] does not need any further PT services  PT Diagnosis Difficulty walking;Generalized weakness         PT Goals (Current goals can be found in the Care Plan section) Acute Rehab PT Goals  Patient Stated Goal: to go home today PT Goal Formulation: All assessment and education complete, DC therapy               End of Session Equipment Utilized During Treatment: Oxygen Activity Tolerance: Other (comment) (limited by DOE and decreased O2 sats wtih mobility) Patient left: in chair;with call bell/phone within reach (with MD in room)           Time: [id number] PT Time Calculation (min) (ACUTE ONLY): 25 min   Charges:   PT Evaluation $PT Eval Moderate Complexity: 1 Procedure PT Treatments $Gait Training: 8-22 mins        Yamile Roedl B. [place], [place], DPT [phone]   06/25/2015, 10:26 AM

## 2015-06-25 NOTE — Progress Notes (Signed)
Discussed obtain ABG with patient indicated that she was now amenable to this procedure.

## 2015-06-25 NOTE — Progress Notes (Signed)
Pt discharged to home via w/c, educated on use of home 02, home  02 tanks delivered to pt, discharge instruction, noted that pts meds being sent to [facility] mail order rx, notified MD that abx and steroid would not be received for 5-7 days, rx changed to paper scrip and given to pt, stressed importance of getting rx filled and taking as prescribed, pt verbalized understanding of all instructions , left facility accompanied by son.  Edward Qualia RN

## 2015-06-25 NOTE — Discharge Summary (Signed)
[facility] Discharge Summary  Patient name: Tiffany [name] Medical record number: [id number] Date of birth: 1960-07-14 Age: 55 y.o. Gender: female Date of Admission: 06/21/2015  Date of Discharge: 06/25/2015 Admitting Physician: Lind Covert, MD  Primary Care Provider: Kathrine Cords, MD Consultants: PCCM  Indication for Hospitalization: acute respiratory failure  Discharge Diagnoses/Problem List:  Active Problems:   CAP (community acquired pneumonia)   Acute respiratory failure with hypoxemia ([place])   COPD   T2DM    HTN  Disposition: Home with supplemental O2  Discharge Condition: Improved  Discharge Exam:  Blood pressure 138/80, pulse 60, temperature 99 F (37.2 C), temperature source Oral, resp. rate 21, height _0  (1.626 m), weight 261 lb 14.4 oz (118.797 kg), SpO2 96 %. Physical Exam: Exam: General: Tired-appearing, obese female, sitting in bed with Dix in place Neck: Supple, could not assess for JVD due to habitus Cardiovascular: RRR, S1, S2, no m/r/g Respiratory: Mild expiratory wheezes with crackles throughout. No increased WOB noted.  Abdomen: +BS, S, NT, ND MSK: Moves all extremities spontaneously. Trace pitting edema of bilateral shins. Skin: WWP. No rashes appreciated.  Neuro: AOx3, no focal deficits Psych: Normal mood and affect.   Brief Hospital Course:  Tiffany [name] is a 55 y.o. female who presented in acute respiratory failure. PMH is significant for HTN, COPD, asthma, T2DM, HLD, depression, bipolar 1, obesity, tobacco abuse, remote history of suspected MI, OSA and seizures.  Acute on chronic respiratory failure 2/2 Severe Community Acquired Pneumonia Patient presented with SOB x 4 days with associated symptoms of cough, chills, and sweating, which began at the same time. In the ED, she was afebrile but tachypneic to 31 and hypoxic on RA, with O2 saturations in the mid-60s, per ED provider. She was not on home oxygen  prior to this episode but has been in the past. Oxygen saturation improved on non-rebreather mask. She was found to have a respiratory acidosis and leukocytosis of 18.9. CXR revealed a large opacity of left mid and lower lung fields, concerning for pneumonia. She was started on BIPAP and started on IV rocephin and azithromycin. Repeat ABG showed worsening pH and pCO2, though patient had normal mentation. Patient has a history of intubation in setting of pneumonia, so PCCM was consulted and managed patient while requiring bipap.   Our team recommended continued monitoring in the hospital but patient was adamant about leaving 06/25/15. She was discharged to enable ordering of home oxygen and because ABG the morning of discharge showed improvement in pH (7.365 < 7.272), pO2 (70.4 < 64.4), and pCO2 (49.3 < 57.1) after a night of intermittent bipap. Repeat CXR 6/9 also had shown improvement, and left lateral decubitus film was negative for pleural effusion. She received 4 doses of IV rocephin, 3 doses of IV azithromycin, and 3 days of solumedrol. Given continued oxygen requirement, patient was discharged on aggressive PO regimen of levaquin 750 mg x 5 days and prednisone 50 mg x 5 days. Blood cultures x 2 showed no growth x 3 days at time of discharge.   PT evaluated patient prior to discharge. She desaturated to 87% at rest and to 78% while ambulating on RA. SpO2 90% while ambulating with 5L O2. Home oxygen was ordered.  COPD, with hypercapnia and possible acute exacerbation. Last treated for exacerbation 02/14/15. Home breo-ellipta and spiriva continued. She was given scheduled duonebs while hospitalized.  AKI:  Scr was 1.61 on admission compared to baseline ~1. Improved to 1.48 by  time of discharge. May have been due to hypoperfusion 2/2 hypoxemia, though LA was < 2 on admission. Home metformin, lisinopril, lasix and spironolactone initially held but were restarted upon discharge.   Other chronic conditions  treated with home regimen.   Issues for Follow Up:  1. Assess respiratory status and current oxygen needs. 2. Assess blood pressure control. She was hypertensive to 183/97 the evening prior to discharge. Patient's discharge medications included amlodipine in addition to verapamil, both of which were given during hospitalization, but PCP clarified with patient that she prefers her to take verapamil alone at this time (this update is not reflected in below medication list).  3. [place] smoking cessation.  4. Consider BMP to check for resolution of AKI.  5. Consider referral to outpatient pulmonary rehab if patient agreeable.  6. Consider PFTs once recovered.  Significant Procedures: None  Significant Labs and Imaging:   Recent Labs Lab 06/24/15 0221 06/24/15 0847 06/25/15 0406  WBC 19.8* 20.2* 18.9*  HGB 9.4* 10.0* 10.0*  HCT 32.1* 33.8* 33.6*  PLT 478* 464* 537*    Recent Labs Lab 06/22/15 0520 06/23/15 1438 06/24/15 0221 06/24/15 0847 06/25/15 0406  NA 137 141 138 140 140  K 5.4* 5.3* 5.2* 5.4* 4.9  CL 102 108 105 107 105  CO2 _0 GLUCOSE 89 154* 108* 107* 107*  BUN 19 29* 25* 26* 28*  CREATININE 1.76* 1.68* 1.46* 1.47* 1.48*  CALCIUM 8.7* 9.2 9.1 9.4 9.4  MG  --   --  2.2  --   --   PHOS  --   --  2.2*  --   --    Dg Chest 2 View  06/25/2015  CLINICAL DATA:  Community acquired pneumonia. Nonproductive cough and shortness breath for 1 week. Asthma. EXAM: CHEST  2 VIEW COMPARISON:  06/24/2015 IMPRESSION: Decreased bilateral interstitial prominence, and decreased left lower lung airspace disease. Stable mild cardiomegaly.   Dg Chest Left Decubitus  06/25/2015  CLINICAL DATA:  Dry cough, shortness of breath. Community acquired pneumonia. EXAM: CHEST - LEFT DECUBITUS COMPARISON:  06/24/2015 IMPRESSION: No visible significant layering left pleural effusion.  Dg Chest Port 1 View  06/24/2015  CLINICAL DATA:  Acute respiratory failure, community-acquired  pneumonia, history of asthma, diabetes EXAM: PORTABLE CHEST 1 VIEW COMPARISON:  Portable chest x-ray of June 21, 2015  IMPRESSION: Decreasing atelectasis or pneumonia in the left lung. Persistent interstitial infiltrates or mild asymmetric pulmonary edema bilaterally.    Results/Tests Pending at Time of Discharge: Final blood culture results  Discharge Medications:    Medication List    STOP taking these medications        terbinafine 250 MG tablet  Commonly known as:  LAMISIL      TAKE these medications        ACCU-CHEK AVIVA PLUS w/Device Kit  TEST FOUR TIMES DAILY     ACCU-CHEK SOFTCLIX LANCETS lancets     albuterol 108 (90 Base) MCG/ACT inhaler  Commonly known as:  PROVENTIL HFA;VENTOLIN HFA  Inhale 2 puffs into the lungs every 6 (six) hours as needed.     albuterol (2.5 MG/3ML) 0.083% nebulizer solution  Commonly known as:  PROVENTIL  Take 3 mLs (2.5 mg total) by nebulization every 6 (six) hours as needed for wheezing or shortness of breath.     amLODipine 5 MG tablet  Commonly known as:  NORVASC  Take 2 tablets (10 mg total) by mouth daily.     aspirin 81  MG EC tablet  TAKE 1 TABLET (81 MG TOTAL) BY MOUTH DAILY.     atorvastatin 40 MG tablet  Commonly known as:  LIPITOR  Take 1 tablet (40 mg total) by mouth daily.     benzonatate 100 MG capsule  Commonly known as:  TESSALON  Take 1 capsule (100 mg total) by mouth 3 (three) times daily as needed for cough.     diphenhydrAMINE 25 mg capsule  Commonly known as:  BENADRYL  Take 50 mg by mouth every 6 (six) hours as needed.     escitalopram 20 MG tablet  Commonly known as:  LEXAPRO  Take 1 tablet (20 mg total) by mouth daily.     fluticasone furoate-vilanterol 100-25 MCG/INH Aepb  Commonly known as:  BREO ELLIPTA  Inhale 1 puff into the lungs daily.     furosemide 40 MG tablet  Commonly known as:  LASIX  Take 1 tablet (40 mg total) by mouth daily.     gabapentin 600 MG tablet  Commonly known as:  NEURONTIN   Take 1 tablet (600 mg total) by mouth 3 (three) times daily.     levofloxacin 750 MG tablet  Commonly known as:  LEVAQUIN  Take 1 tablet (750 mg total) by mouth daily.     lisinopril 40 MG tablet  Commonly known as:  PRINIVIL,ZESTRIL  TAKE 1 TABLET EVERY DAY     metFORMIN 500 MG 24 hr tablet  Commonly known as:  GLUCOPHAGE-XR  Take 500 mg by mouth 2 (two) times daily.     mirtazapine 15 MG tablet  Commonly known as:  REMERON  Take 1 tablet (15 mg total) by mouth at bedtime.     montelukast 10 MG tablet  Commonly known as:  SINGULAIR  Take 1 tablet (10 mg total) by mouth at bedtime.     multivitamin with minerals tablet  Take 1 tablet by mouth daily.     naproxen 500 MG tablet  Commonly known as:  NAPROSYN  Take 1 tablet (500 mg total) by mouth 3 (three) times daily as needed.     omeprazole 40 MG capsule  Commonly known as:  PRILOSEC  Take 1 capsule (40 mg total) by mouth daily.     oxybutynin 5 MG tablet  Commonly known as:  DITROPAN  Take 1 tablet (5 mg total) by mouth 3 (three) times daily.     oxymetazoline 0.05 % nasal spray  Commonly known as:  AFRIN  Place 1 spray into both nostrils 2 (two) times daily.     permethrin 5 % cream  Commonly known as:  ACTICIN  Apply 1 application topically once. Apply from head to soles of feet at bedtime, then wash off 8-14 hours after     potassium chloride 10 MEQ tablet  Commonly known as:  K-DUR,KLOR-CON  TAKE 2 TABLETS EVERY DAY     predniSONE 50 MG tablet  Commonly known as:  DELTASONE  Take 1 tablet (50 mg total) by mouth daily with breakfast.  Start taking on:  06/26/2015     tiotropium 18 MCG inhalation capsule  Commonly known as:  SPIRIVA HANDIHALER  Place 1 capsule (18 mcg total) into inhaler and inhale daily.     verapamil 240 MG 24 hr capsule  Commonly known as:  VERELAN PM  TAKE 2 CAPSULES AT BEDTIME     ziprasidone 80 MG capsule  Commonly known as:  GEODON  Take 1 capsule (80 mg total) by mouth 2  (two)  times daily with a meal.        Discharge Instructions: Please refer to Patient Instructions section of EMR for full details.  Patient was counseled important signs and symptoms that should prompt return to medical care, changes in medications, dietary instructions, activity restrictions, and follow up appointments.   Follow-Up Appointments: Follow-up Information    Follow up with [facility].   Why:  Registered Nurse   Contact information:   [address] [place] [address] [phone]       Follow up with [facility].   Why:  Oxygen   Contact information:   [address] [place]  [address] [phone]       Follow up with Lockie Pares, MD. Go on 06/30/2015.   Specialty:  Family Medicine   Why:  11:00 am hospital follow-up   Contact information:   [place] [address] [phone]      [place] Bussing, MD 06/25/2015, 9:15 PM PGY-1, [place]

## 2015-06-25 NOTE — Care Management Note (Signed)
Case Management Note  Patient Details  Name: Tiffany [name] MRN: [id number] Date of Birth: 1960-08-28  Subjective/Objective:    Pt admitted for CAP. Pt is from home with children. Pt will need Home 02. CM did speak with pt in regard to DME needs- Pt is agreeable to home 02. Pt wants to use [facility] for DME. Agency List provided for [place] as well.-Pt chose [facility]. [place] RN Referral was made to [facility] with [facility] and SOC to begin within 24-48 hrs post d/c. DME referral given to Manuela Schwartz with [facility]. DME to be delivered to room before d/c.               Action/Plan: NCM discussed [facility] and community resources- Pt stated she has used [facility] in the past with [facility] as her Therapist, sports. Pt is agreeable to Services. [facility] consult placed for COPD.  Pt is planned for d/c today. No further needs from [facility] @ this time.    Expected Discharge Date:                  Expected Discharge Plan:  [place]  In-House Referral:  NA  Discharge planning Services  CM Consult  Post Acute Care Choice:  Durable Medical Equipment, Home Health Choice offered to:     DME Arranged:  Oxygen DME Agency:  [place] Arranged:  RN [facility] Agency:  [place]  Status of Service:  Completed, signed off  Medicare Important Message Given:  Yes Date Medicare IM Given:    Medicare IM give by:    Date Additional Medicare IM Given:    Additional Medicare Important Message give by:     If discussed at [place] of Stay Meetings, dates discussed:    Additional Comments:  Bethena Roys, RN 06/25/2015, 11:33 AM

## 2015-06-25 NOTE — Discharge Instructions (Signed)
Tiffany [name],  You were treated for a severe pneumonia. Please complete 5-day course of the antibiotic levaquin 750 mg daily. Also take the steroid prednisone 50 mg daily for the next 5 days. Please use oxygen until reassessed at follow-up.   If you have worsening shortness of breath, even on oxygen, or confusion please go to the emergency department.   Thank you. Respiratory failure is when your lungs are not working well and your breathing (respiratory) system fails. When respiratory failure occurs, it is difficult for your lungs to get enough oxygen, get rid of carbon dioxide, or both. Respiratory failure can be life threatening.  Respiratory failure can be acute or chronic. Acute respiratory failure is sudden, severe, and requires emergency medical treatment. Chronic respiratory failure is less severe, happens over time, and requires ongoing treatment.  WHAT ARE THE CAUSES OF ACUTE RESPIRATORY FAILURE?  Any problem affecting the heart or lungs can cause acute respiratory failure. Some of these causes include the following:  Chronic bronchitis and emphysema (COPD).   Blood clot going to a lung (pulmonary embolism).   Having water in the lungs caused by heart failure, lung injury, or infection (pulmonary edema).   Collapsed lung (pneumothorax).   Pneumonia.   Pulmonary fibrosis.   Obesity.   Asthma.   Heart failure.   Any type of trauma to the chest that can make breathing difficult.   Nerve or muscle diseases making chest movements difficult. HOW WILL MY ACUTE RESPIRATORY FAILURE BE TREATED?  Treatment of acute respiratory failure depends on the cause of the respiratory failure. Usually, you will stay in the intensive care unit so your breathing can be watched closely. Treatment can include the following:  Oxygen. Oxygen can be delivered through the following:  Nasal cannula. This is small tubing that goes in your nose to give you oxygen.  Face mask. A face mask  covers your nose and mouth to give you oxygen.  Medicine. Different medicines can be given to help with breathing. These can include:  Nebulizers. Nebulizers deliver medicines to open the air passages (bronchodilators). These medicines help to open or relax the airways in the lungs so you can breathe better. They can also help loosen mucus from your lungs.  Diuretics. Diuretic medicines can help you breathe better by getting rid of extra water in your body.  Steroids. Steroid medicines can help decrease swelling (inflammation) in your lungs.  Antibiotics.  Chest tube. If you have a collapsed lung (pneumothorax), a chest tube is placed to help reinflate the lung.  Noninvasive positive pressure ventilation (NPPV). This is a tight-fitting mask that goes over your nose and mouth. The mask has tubing that is attached to a machine. The machine blows air into the tubing, which helps to keep the tiny air sacs (alveoli) in your lungs open. This machine allows you to breathe on your own.  Ventilator. A ventilator is a breathing machine. When on a ventilator, a breathing tube is put into the lungs. A ventilator is used when you can no longer breathe well enough on your own. You may have low oxygen levels or high carbon dioxide (CO2) levels in your blood. When you are on a ventilator, sedation and pain medicines are given to make you sleep so your lungs can heal. SEEK IMMEDIATE MEDICAL CARE IF:  You have shortness of breath (dyspnea) with or without activity.  You have rapid breathing (tachypnea).  You are wheezing.  You are unable to say more than a few words  without having to catch your breath.  You find it very difficult to function normally.  You have a fast heart rate.  You have a bluish color to your finger or toe nail beds.  You have confusion or drowsiness or both.   This information is not intended to replace advice given to you by your health care provider. Make sure you discuss any  questions you have with your health care provider.   Document Released: 01/07/2013 Document Revised: 09/23/2014 Document Reviewed: 01/07/2013 Elsevier Interactive Patient Education [employer].

## 2015-06-25 NOTE — Progress Notes (Addendum)
06/25/2015 SATURATION QUALIFICATIONS: (This note is used to comply with regulatory documentation for home oxygen)  Patient Saturations on Room Air at Rest = 83%  Patient Saturations on Room Air while Ambulating = 83%  Patient Saturations on 5 Liters of oxygen while Ambulating = 85%, increased to 89 with 3 min standing rest break and pursed lip breathing.   Please briefly explain why patient needs home oxygen: Pt is unable to maintain sats even on RA at rest and certainly not on RA while ambulating.   Barbarann Ehlers [place], [place], DPT [phone]

## 2015-06-25 NOTE — Telephone Encounter (Signed)
Called for clarification. She will continue verapamil and discontinue amlodipine at this time. Stressed it appears she is still hospitalized and this regimen may change prior to her discharge. Discussed that in the future, we may need to add amlodipine back on (as they do act slightly different despite the fact that they're both CCBs).  Archie Patten, MD [facility] Family Medicine Resident

## 2015-06-26 DIAGNOSIS — I110 Hypertensive heart disease with heart failure: Secondary | ICD-10-CM | POA: Diagnosis not present

## 2015-06-26 DIAGNOSIS — E1142 Type 2 diabetes mellitus with diabetic polyneuropathy: Secondary | ICD-10-CM | POA: Diagnosis not present

## 2015-06-26 DIAGNOSIS — J189 Pneumonia, unspecified organism: Secondary | ICD-10-CM | POA: Diagnosis not present

## 2015-06-26 DIAGNOSIS — J440 Chronic obstructive pulmonary disease with acute lower respiratory infection: Secondary | ICD-10-CM | POA: Diagnosis not present

## 2015-06-26 DIAGNOSIS — I5032 Chronic diastolic (congestive) heart failure: Secondary | ICD-10-CM | POA: Diagnosis not present

## 2015-06-26 DIAGNOSIS — J45909 Unspecified asthma, uncomplicated: Secondary | ICD-10-CM | POA: Diagnosis not present

## 2015-06-26 DIAGNOSIS — F329 Major depressive disorder, single episode, unspecified: Secondary | ICD-10-CM | POA: Diagnosis not present

## 2015-06-26 DIAGNOSIS — F419 Anxiety disorder, unspecified: Secondary | ICD-10-CM | POA: Diagnosis not present

## 2015-06-26 DIAGNOSIS — F319 Bipolar disorder, unspecified: Secondary | ICD-10-CM | POA: Diagnosis not present

## 2015-06-27 LAB — CULTURE, BLOOD (ROUTINE X 2)
Culture: NO GROWTH
Culture: NO GROWTH

## 2015-06-28 ENCOUNTER — Other Ambulatory Visit: Payer: Self-pay | Admitting: *Deleted

## 2015-06-28 ENCOUNTER — Other Ambulatory Visit: Payer: Self-pay | Admitting: Family Medicine

## 2015-06-28 NOTE — Patient Outreach (Signed)
Referral received from hospital liaison to start transition of care program upon discharge.  Member previously involved with this care manager, most recently involved with health coach.  Admitted on 6/5 with community acquired pneumonia, discharged 6/9.  Call placed to member to initiate program.  She state she is doing "alright."  She reports that she has been using her oxygen (6 liters) without any problems.  She still has a cough and some hoarseness noted, but she state it's "better."  Although she has been advised against it, she continues to smoke.  Advised of the dangers of smoking with oxygen, again advised against it.  Encouraged to use Nicotine patches, using the resource 1-800-Quit Now for assistance.  She state that she has been contacted by [place] for home health services and had her first visit on Saturday.  [name] unsure how often and how long they will be involved.  Patient was recently discharged from hospital and all medications have been reviewed.  She state she has not had to use her nebulizer, but state she has used a rescue inhaler a couple times.  She has a follow up with her PCP on Wednesday, but denies scheduling an appointment with a pulmonologist.  She was seen by Dr. Melvyn Novas years ago, may need another referral.  Advised to discuss with her PCP this week and request a new referral.  She verbalizes understanding.    She denies any concerns, encouraged to contact with questions.  Will contact PCP office to request referral for pulmonologist, will follow up with member next week.  [name], [place], MSN [name] [phone]

## 2015-06-30 ENCOUNTER — Telehealth: Payer: Self-pay | Admitting: *Deleted

## 2015-06-30 ENCOUNTER — Other Ambulatory Visit: Payer: Self-pay | Admitting: *Deleted

## 2015-06-30 ENCOUNTER — Inpatient Hospital Stay: Payer: Self-pay | Admitting: Internal Medicine

## 2015-06-30 NOTE — Patient Outreach (Signed)
Member has PCP visit this morning.  Noted in chart that she has not seen a pulmonologist in several years.  Call placed to PCP office, spoke to [place].  Request made to have physician consider referral to pulmonologist for management of COPD.  Will follow up with member next week.  [name], [place], MSN [place] [phone]

## 2015-06-30 NOTE — Telephone Encounter (Signed)
Tiffany [name] (case manager from [facility]) called and would like pt to have a referral to a pulmonologist.  Tiffany [name] states that pt was recently in th hospital for COPD and Pneumonia and has not seen a pulmonologist in years. [place], [name], CMA

## 2015-07-01 ENCOUNTER — Other Ambulatory Visit: Payer: Self-pay | Admitting: *Deleted

## 2015-07-01 ENCOUNTER — Other Ambulatory Visit (HOSPITAL_COMMUNITY): Payer: Self-pay | Admitting: Psychiatry

## 2015-07-01 MED ORDER — NAPROXEN 500 MG PO TABS: 500.0000 mg | ORAL_TABLET | Freq: Three times a day (TID) | ORAL | Status: DC | PRN

## 2015-07-01 MED ORDER — OMEPRAZOLE 40 MG PO CPDR: 40.0000 mg | DELAYED_RELEASE_CAPSULE | Freq: Every day | ORAL | Status: DC

## 2015-07-01 NOTE — Addendum Note (Signed)
Addended by: Archie Patten on: 07/01/2015 10:36 AM   Modules accepted: Orders

## 2015-07-01 NOTE — ED Notes (Signed)
Ultimately the patient needs to stop smoking, we've had this discussion previously, however I went ahead and referred to pulmonology to see if they can assist in her medication management.  Archie Patten, MD [facility] Family Medicine Resident  07/01/2015, 10:35 AM   Archie Patten, MD 07/01/15 [phone]

## 2015-07-04 ENCOUNTER — Other Ambulatory Visit: Payer: Self-pay | Admitting: Family Medicine

## 2015-07-05 ENCOUNTER — Telehealth: Payer: Self-pay | Admitting: Family Medicine

## 2015-07-05 DIAGNOSIS — J45909 Unspecified asthma, uncomplicated: Secondary | ICD-10-CM | POA: Diagnosis not present

## 2015-07-05 DIAGNOSIS — I110 Hypertensive heart disease with heart failure: Secondary | ICD-10-CM | POA: Diagnosis not present

## 2015-07-05 DIAGNOSIS — E1142 Type 2 diabetes mellitus with diabetic polyneuropathy: Secondary | ICD-10-CM | POA: Diagnosis not present

## 2015-07-05 DIAGNOSIS — F419 Anxiety disorder, unspecified: Secondary | ICD-10-CM | POA: Diagnosis not present

## 2015-07-05 DIAGNOSIS — J440 Chronic obstructive pulmonary disease with acute lower respiratory infection: Secondary | ICD-10-CM | POA: Diagnosis not present

## 2015-07-05 DIAGNOSIS — J189 Pneumonia, unspecified organism: Secondary | ICD-10-CM | POA: Diagnosis not present

## 2015-07-05 DIAGNOSIS — I5032 Chronic diastolic (congestive) heart failure: Secondary | ICD-10-CM | POA: Diagnosis not present

## 2015-07-05 DIAGNOSIS — F319 Bipolar disorder, unspecified: Secondary | ICD-10-CM | POA: Diagnosis not present

## 2015-07-05 DIAGNOSIS — F329 Major depressive disorder, single episode, unspecified: Secondary | ICD-10-CM | POA: Diagnosis not present

## 2015-07-05 MED ORDER — GLUCOSE BLOOD VI STRP: ORAL_STRIP | Status: DC

## 2015-07-05 NOTE — Telephone Encounter (Signed)
Tiffany [name] with Tiffany [name] has several concerns: *Missed her appt at [facility] June 14 because she forgot.  First appt available was July 3.  [name] on home oxygen at 6 liters.  This is new home oxygen.  Tiffany [name] with [facility] is concerned because the appt is so far away.  Can she seen before       July 3 by another dr or put on the cancellation list? *Pt is also out of her test strips since her discharge.   *Pt is continuing to smoke one pack per day. She has discontinued using her nicotine patches while she was smoking.

## 2015-07-05 NOTE — Telephone Encounter (Signed)
1) I have sent in Rx for her glucose test strips. 2) She should absolutely make a clinic appt with another provider- please call the patient and have this set up as soon as possible.  3) I have spoken to Ms. Spickard at each appointment about smoking cessation as her breathing/COPD is not getting any better. Please advise her that once she feels like attempting to stop smoking to re-start the nicotine patches.  Thank you, Archie Patten, MD [facility] Family Medicine Resident  07/05/2015, 4:37 PM

## 2015-07-05 NOTE — Telephone Encounter (Addendum)
Called and spoke with the patient personally. Discussed that she should absolutely NOT be smoking with her oxygen. Discussed the risks of explosion as oxygen is highly flammable. Patient voiced understanding.   She notes the nicotine patches do not work. She has an appt with her psychiatrist in 2 days to discuss whether they believe Chantix will be okay with her current psychiatric regimen.   I discussed that someone from our office should call her for an earlier appt with another provider in our clinic.  Archie Patten, MD [facility] Family Medicine Resident  07/05/2015, 4:48 PM

## 2015-07-06 ENCOUNTER — Other Ambulatory Visit: Payer: Self-pay | Admitting: Internal Medicine

## 2015-07-06 ENCOUNTER — Other Ambulatory Visit: Payer: Self-pay | Admitting: *Deleted

## 2015-07-06 ENCOUNTER — Telehealth: Payer: Self-pay | Admitting: Pulmonary Disease

## 2015-07-06 ENCOUNTER — Other Ambulatory Visit: Payer: Self-pay

## 2015-07-06 NOTE — Telephone Encounter (Signed)
Spoke with Donita with [facility], states she is requesting a verbal order to increase pt's home health visits to 2X weekly. Pt has no future office visits in this practice. Pt last seen 01/2011 by RB.  RB please advise.  Thanks.

## 2015-07-06 NOTE — Patient Outreach (Signed)
Voice message received from both [place], and member yesterday.  Crystal's call was regarding collaboration on plan of care.  She states member missed her follow up appointment last week and does not have another one scheduled until next month.  She also state the member reported not having any test strips for glucose monitoring, and reports continued smoking.  Member's voice message was concerning her financial assistance, Extra Help, for her medications.  She questions if there is a renewal date and who she need to contact regarding that.  Call placed back to both [place] and member, no answer, HIPPA compliant voice messages left, will await calls back.  Notified [facility] pharmacist, D. Pettus, about member's question regarding extra help, will await contact.  [name], [place], MSN [place] [phone]

## 2015-07-06 NOTE — Telephone Encounter (Signed)
Left message on voicemail for patient to call back and schedule an earlier appointment with another provider.

## 2015-07-06 NOTE — Patient Outreach (Signed)
I called Tiffany [name] to discuss her question in regards to Extra Help.  I had to leave a HIPPA compliant message for the patient to call me back.  She still has Extra Help.  She does not have to renew it.  She will have it for life unless she has an income change.  If she receives a letter from [employer] requesting verification of her income, she will need to respond to it.  If she does not, she will lose her Extra Help.  I have discussed this with [name], RN, her nurse care manager as well.  She has a home visit with her.   I have asked her to discuss this with Ms. Toscano.  She stated she will.  I will close her to pharmacy since all pharmacy needs have been met.    [name], PharmD, [place] [phone]

## 2015-07-07 ENCOUNTER — Ambulatory Visit (HOSPITAL_COMMUNITY): Payer: Self-pay | Admitting: Psychiatry

## 2015-07-07 ENCOUNTER — Ambulatory Visit: Payer: Self-pay | Admitting: *Deleted

## 2015-07-07 ENCOUNTER — Other Ambulatory Visit: Payer: Self-pay | Admitting: *Deleted

## 2015-07-07 NOTE — Patient Outreach (Signed)
Weekly transition of care call placed to member, no answer.  HIPPA compliant voice message left, will await call back.  If no call back, will follow up next week.  [name], [place], MSN [place] [phone]

## 2015-07-07 NOTE — Telephone Encounter (Signed)
This needs to be deferred to her family Practice MD's. I don't know what is going on with her these days, haven't seen her in years.

## 2015-07-07 NOTE — Telephone Encounter (Signed)
lmtcb X1 for Tiffany [name] with [facility] to make aware of recs.

## 2015-07-08 NOTE — Telephone Encounter (Signed)
Donita from [facility] calling to see if Dr. Lake Bells would be willing to approve 2 more weeks of home visits for this patient. Dr. Lake Bells, please advise.

## 2015-07-08 NOTE — Telephone Encounter (Signed)
Tiffany [name] returning call and can be reached @ [phone], wanting to know if [facility] will give the order? Please advise.Hillery Hunter

## 2015-07-08 NOTE — Telephone Encounter (Signed)
Called and advised Donita of [facility] that Dr. Lake Bells will not approve. Nothing further needed.

## 2015-07-08 NOTE — Telephone Encounter (Signed)
no

## 2015-07-09 ENCOUNTER — Emergency Department (HOSPITAL_COMMUNITY)
Admission: EM | Admit: 2015-07-09 | Discharge: 2015-07-11 | Disposition: A | Discharge status: Home / Self Care | Payer: Commercial Managed Care - HMO | Attending: Emergency Medicine | Admitting: Emergency Medicine

## 2015-07-09 ENCOUNTER — Encounter (HOSPITAL_COMMUNITY): Payer: Self-pay | Admitting: Emergency Medicine

## 2015-07-09 DIAGNOSIS — E119 Type 2 diabetes mellitus without complications: Secondary | ICD-10-CM | POA: Insufficient documentation

## 2015-07-09 DIAGNOSIS — F23 Brief psychotic disorder: Secondary | ICD-10-CM | POA: Diagnosis not present

## 2015-07-09 DIAGNOSIS — Z79899 Other long term (current) drug therapy: Secondary | ICD-10-CM | POA: Diagnosis not present

## 2015-07-09 DIAGNOSIS — F919 Conduct disorder, unspecified: Secondary | ICD-10-CM | POA: Insufficient documentation

## 2015-07-09 DIAGNOSIS — F322 Major depressive disorder, single episode, severe without psychotic features: Secondary | ICD-10-CM | POA: Diagnosis not present

## 2015-07-09 DIAGNOSIS — F17210 Nicotine dependence, cigarettes, uncomplicated: Secondary | ICD-10-CM | POA: Insufficient documentation

## 2015-07-09 DIAGNOSIS — I252 Old myocardial infarction: Secondary | ICD-10-CM | POA: Insufficient documentation

## 2015-07-09 DIAGNOSIS — Z7984 Long term (current) use of oral hypoglycemic drugs: Secondary | ICD-10-CM | POA: Insufficient documentation

## 2015-07-09 DIAGNOSIS — Z8673 Personal history of transient ischemic attack (TIA), and cerebral infarction without residual deficits: Secondary | ICD-10-CM | POA: Insufficient documentation

## 2015-07-09 DIAGNOSIS — F418 Other specified anxiety disorders: Secondary | ICD-10-CM | POA: Diagnosis not present

## 2015-07-09 DIAGNOSIS — F29 Unspecified psychosis not due to a substance or known physiological condition: Secondary | ICD-10-CM | POA: Diagnosis not present

## 2015-07-09 DIAGNOSIS — Z8669 Personal history of other diseases of the nervous system and sense organs: Secondary | ICD-10-CM | POA: Diagnosis not present

## 2015-07-09 DIAGNOSIS — J45909 Unspecified asthma, uncomplicated: Secondary | ICD-10-CM | POA: Insufficient documentation

## 2015-07-09 DIAGNOSIS — F319 Bipolar disorder, unspecified: Secondary | ICD-10-CM | POA: Diagnosis not present

## 2015-07-09 DIAGNOSIS — Z7982 Long term (current) use of aspirin: Secondary | ICD-10-CM | POA: Diagnosis not present

## 2015-07-09 DIAGNOSIS — F22 Delusional disorders: Secondary | ICD-10-CM | POA: Diagnosis not present

## 2015-07-09 LAB — CBC
HCT: 37.9 % (ref 36.0–46.0)
Hemoglobin: 12.5 g/dL (ref 12.0–15.0)
MCH: 28.9 pg (ref 26.0–34.0)
MCHC: 33 g/dL (ref 30.0–36.0)
MCV: 87.5 fL (ref 78.0–100.0)
Platelets: 525 10*3/uL — ABNORMAL HIGH (ref 150–400)
RBC: 4.33 MIL/uL (ref 3.87–5.11)
RDW: 17.1 % — ABNORMAL HIGH (ref 11.5–15.5)
WBC: 13.9 10*3/uL — ABNORMAL HIGH (ref 4.0–10.5)

## 2015-07-09 LAB — COMPREHENSIVE METABOLIC PANEL
ALT: 15 U/L (ref 14–54)
AST: 15 U/L (ref 15–41)
Albumin: 3.9 g/dL (ref 3.5–5.0)
Alkaline Phosphatase: 100 U/L (ref 38–126)
Anion gap: 9 (ref 5–15)
BUN: 13 mg/dL (ref 6–20)
CO2: 27 mmol/L (ref 22–32)
Calcium: 9.5 mg/dL (ref 8.9–10.3)
Chloride: 103 mmol/L (ref 101–111)
Creatinine, Ser: 0.98 mg/dL (ref 0.44–1.00)
GFR calc Af Amer: >60 mL/min (ref >60)
GFR calc non Af Amer: >60 mL/min (ref >60)
Glucose, Bld: 107 mg/dL — ABNORMAL HIGH (ref 65–99)
Potassium: 3.3 mmol/L — ABNORMAL LOW (ref 3.5–5.1)
Sodium: 139 mmol/L (ref 135–145)
Total Bilirubin: 1.5 mg/dL — ABNORMAL HIGH (ref 0.3–1.2)
Total Protein: 7.3 g/dL (ref 6.5–8.1)

## 2015-07-09 LAB — RAPID URINE DRUG SCREEN, HOSP PERFORMED
Amphetamines: NOT DETECTED
Barbiturates: NOT DETECTED
Benzodiazepines: NOT DETECTED
Cocaine: POSITIVE — AB
Opiates: NOT DETECTED
Tetrahydrocannabinol: NOT DETECTED

## 2015-07-09 LAB — ACETAMINOPHEN LEVEL: Acetaminophen (Tylenol), Serum: <10 ug/mL — ABNORMAL LOW (ref 10–30)

## 2015-07-09 LAB — SALICYLATE LEVEL: Salicylate Lvl: <4 mg/dL (ref 2.8–30.0)

## 2015-07-09 LAB — ETHANOL: Alcohol, Ethyl (B): <5 mg/dL (ref <5)

## 2015-07-09 MED ORDER — GABAPENTIN 300 MG PO CAPS
600.0000 mg | ORAL_CAPSULE | Freq: Three times a day (TID) | ORAL | Status: DC
  Administered 2015-07-09 – 2015-07-10 (×4): 600 mg via ORAL
  Administered 2015-07-10: 300 mg via ORAL
  Administered 2015-07-11: 600 mg via ORAL
  Filled 2015-07-09 (×6): qty 2

## 2015-07-09 MED ORDER — PANTOPRAZOLE SODIUM 40 MG PO TBEC
40.0000 mg | DELAYED_RELEASE_TABLET | Freq: Every day | ORAL | Status: DC
  Administered 2015-07-09 – 2015-07-11 (×3): 40 mg via ORAL
  Filled 2015-07-09 (×3): qty 1

## 2015-07-09 MED ORDER — LISINOPRIL 40 MG PO TABS
40.0000 mg | ORAL_TABLET | Freq: Every day | ORAL | Status: DC
  Administered 2015-07-09 – 2015-07-11 (×3): 40 mg via ORAL
  Filled 2015-07-09 (×3): qty 1

## 2015-07-09 MED ORDER — TIOTROPIUM BROMIDE MONOHYDRATE 18 MCG IN CAPS
18.0000 ug | ORAL_CAPSULE | Freq: Every day | RESPIRATORY_TRACT | Status: DC
  Administered 2015-07-09 – 2015-07-11 (×3): 18 ug via RESPIRATORY_TRACT
  Filled 2015-07-09: qty 5

## 2015-07-09 MED ORDER — ASPIRIN EC 81 MG PO TBEC
81.0000 mg | DELAYED_RELEASE_TABLET | Freq: Every day | ORAL | Status: DC
  Administered 2015-07-09 – 2015-07-11 (×3): 81 mg via ORAL
  Filled 2015-07-09 (×3): qty 1

## 2015-07-09 MED ORDER — ZIPRASIDONE HCL 20 MG PO CAPS
80.0000 mg | ORAL_CAPSULE | Freq: Two times a day (BID) | ORAL | Status: DC
  Administered 2015-07-09 – 2015-07-11 (×4): 80 mg via ORAL
  Filled 2015-07-09 (×5): qty 4

## 2015-07-09 MED ORDER — METFORMIN HCL ER 500 MG PO TB24
500.0000 mg | ORAL_TABLET | Freq: Two times a day (BID) | ORAL | Status: DC
  Administered 2015-07-09 – 2015-07-11 (×4): 500 mg via ORAL
  Filled 2015-07-09 (×6): qty 1

## 2015-07-09 MED ORDER — MIRTAZAPINE 30 MG PO TABS
15.0000 mg | ORAL_TABLET | Freq: Every day | ORAL | Status: DC
  Administered 2015-07-09 – 2015-07-10 (×2): 15 mg via ORAL
  Filled 2015-07-09 (×2): qty 1

## 2015-07-09 MED ORDER — ESCITALOPRAM OXALATE 10 MG PO TABS
20.0000 mg | ORAL_TABLET | Freq: Every day | ORAL | Status: DC
  Administered 2015-07-09: 20 mg via ORAL
  Administered 2015-07-10: 10 mg via ORAL
  Administered 2015-07-11: 20 mg via ORAL
  Filled 2015-07-09 (×3): qty 2

## 2015-07-09 MED ORDER — POTASSIUM CHLORIDE CRYS ER 20 MEQ PO TBCR
40.0000 meq | EXTENDED_RELEASE_TABLET | Freq: Once | ORAL | Status: AC
  Administered 2015-07-09: 40 meq via ORAL
  Filled 2015-07-09: qty 2

## 2015-07-09 MED ORDER — FLUTICASONE FUROATE-VILANTEROL 100-25 MCG/INH IN AEPB: 1.0000 | INHALATION_SPRAY | Freq: Every day | RESPIRATORY_TRACT | Status: DC

## 2015-07-09 MED ORDER — MONTELUKAST SODIUM 10 MG PO TABS
10.0000 mg | ORAL_TABLET | Freq: Every day | ORAL | Status: DC
  Administered 2015-07-09 – 2015-07-10 (×2): 10 mg via ORAL
  Filled 2015-07-09 (×3): qty 1

## 2015-07-09 MED ORDER — ACETAMINOPHEN 325 MG PO TABS: 650.0000 mg | ORAL_TABLET | ORAL | Status: DC | PRN

## 2015-07-09 MED ORDER — POTASSIUM CHLORIDE CRYS ER 20 MEQ PO TBCR
20.0000 meq | EXTENDED_RELEASE_TABLET | Freq: Every day | ORAL | Status: DC
  Administered 2015-07-09 – 2015-07-11 (×3): 20 meq via ORAL
  Filled 2015-07-09 (×3): qty 1

## 2015-07-09 MED ORDER — ATORVASTATIN CALCIUM 40 MG PO TABS
40.0000 mg | ORAL_TABLET | Freq: Every day | ORAL | Status: DC
  Administered 2015-07-09 – 2015-07-11 (×3): 40 mg via ORAL
  Filled 2015-07-09 (×3): qty 1

## 2015-07-09 MED ORDER — MOMETASONE FURO-FORMOTEROL FUM 200-5 MCG/ACT IN AERO
2.0000 | INHALATION_SPRAY | Freq: Two times a day (BID) | RESPIRATORY_TRACT | Status: DC
  Administered 2015-07-09 – 2015-07-11 (×4): 2 via RESPIRATORY_TRACT
  Filled 2015-07-09: qty 8.8

## 2015-07-09 MED ORDER — AMLODIPINE BESYLATE 10 MG PO TABS
10.0000 mg | ORAL_TABLET | Freq: Every day | ORAL | Status: DC
  Administered 2015-07-09 – 2015-07-11 (×3): 10 mg via ORAL
  Filled 2015-07-09 (×3): qty 1

## 2015-07-09 MED ORDER — FUROSEMIDE 40 MG PO TABS
40.0000 mg | ORAL_TABLET | Freq: Every day | ORAL | Status: DC
  Administered 2015-07-09 – 2015-07-11 (×3): 40 mg via ORAL
  Filled 2015-07-09 (×3): qty 1

## 2015-07-09 MED ORDER — ONDANSETRON HCL 4 MG PO TABS: 4.0000 mg | ORAL_TABLET | Freq: Three times a day (TID) | ORAL | Status: DC | PRN

## 2015-07-09 MED ORDER — OXYBUTYNIN CHLORIDE 5 MG PO TABS
5.0000 mg | ORAL_TABLET | Freq: Three times a day (TID) | ORAL | Status: DC
  Administered 2015-07-09 – 2015-07-11 (×6): 5 mg via ORAL
  Filled 2015-07-09 (×9): qty 1

## 2015-07-09 MED ORDER — VERAPAMIL HCL ER 240 MG PO TBCR
480.0000 mg | EXTENDED_RELEASE_TABLET | Freq: Every day | ORAL | Status: DC
  Administered 2015-07-09 – 2015-07-10 (×2): 480 mg via ORAL
  Filled 2015-07-09 (×3): qty 2

## 2015-07-09 NOTE — ED Notes (Signed)
Upon assessment with patient, pt states that she is not suicidal and that she didn't say that.  Family corroborates this story.  Pt states that she meant that she was paranoid that people were after her.  States she stopped taking her medicine about 3 days ago "just because".

## 2015-07-09 NOTE — ED Notes (Signed)
Unsuccessful blood draw x1 pt would not allow for another blood draw

## 2015-07-09 NOTE — ED Notes (Signed)
Unsuccessful blood draw x1 

## 2015-07-09 NOTE — Progress Notes (Addendum)
Report received from previous shift. Pt is resting comfortably. Pt stated, "I have COPD and need my 6 liters of oxygen. Pt pulse ox is 96% on Room air. Reassured the pt she is fine but did place on 1 liter nasal canula . Pt stated, "I already feel better. "pt states, "I hear a lot of voices." Pt states she is hearing men and women's voices but is not sure what they are saying. Pt can be aggressive. She took off her top and stated, "what the hell is all this." Pt states ,"I have mental illness. I hear voices and get paranoid."Pt admitted she has not been taking her meds at home and stated,"I don't want to talk about that. "Pt is alert and oriented times three.Pt remains with a sitter. Pt refused to have her vital signs checked.(5:30pm) Informed the pt we need a urine . Report to oncoming shift. (7:20pm)
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
## 2015-07-09 NOTE — BHH Counselor (Signed)
The following facilities have been contacted to seek placement for this pt, with results as noted:  Beds available, information sent, decision pending:  [facility]    [facility], [place]
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
## 2015-07-09 NOTE — ED Notes (Signed)
Patient states she's ready to have her blood drawn again. Notified main lab that patient was a difficult stick and that we were requesting someone come here to draw her blood.

## 2015-07-09 NOTE — ED Notes (Signed)
Bed: [id number] Expected date:  Expected time:  Means of arrival:  Comments: TR 5

## 2015-07-09 NOTE — ED Notes (Signed)
Per EMS: Pt's mother states that pt has not been taking her psych meds.  Pt states that she is suicidal but then changed her story and stated that she felt like people were after her, trying to hurt her.

## 2015-07-10 DIAGNOSIS — F22 Delusional disorders: Secondary | ICD-10-CM | POA: Diagnosis not present

## 2015-07-10 MED ORDER — ALUM & MAG HYDROXIDE-SIMETH 200-200-20 MG/5ML PO SUSP
15.0000 mL | Freq: Once | ORAL | Status: AC
  Administered 2015-07-10: 15 mL via ORAL
  Filled 2015-07-10: qty 30

## 2015-07-10 NOTE — Progress Notes (Addendum)
Pt awoke and stated, "I need a soda." Pt stated she slept good but continues to hear different voices. Presently she is pleasant and cooperative. Family remains at the bedside with the pt.Pt did not want to get washed up -,"stated I will do it later."Pt does not appear paranoid and is not responding to internal stimuli.Report to oncoming shift. (3pm)
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
## 2015-07-10 NOTE — Progress Notes (Signed)
[name] contacted patient's son who states that he feels that his mother is not quite at baseline.  Patient's son states he will come visit tomorrow at 1600 to see how his mother is doing.  He spoke to her on the phone and she did not sound quite right.  "When she is at her baseline she is not paranoid and looking around."  [place] [facility] [name] [phone]

## 2015-07-11 NOTE — B[facility] Counselor (Signed)
Spoke with pt this a.m. For re-assessment/ follow-up. Patient denies SI/Plan and HI or plan. Patient denies AVH. Pt is alert and oriented x 4.  Dimitri Shakespeare K. Nash Shearer, LPC-A, [facility]  Counselor 07/11/2015 10:31 AM

## 2015-07-11 NOTE — [facility] Suicide Risk Assessment (Cosign Needed)
Suicide Risk Assessment  Discharge Assessment   [facility] Discharge Suicide Risk Assessment   Principal Problem: <principal problem not specified> Discharge Diagnoses:  Patient Active Problem List   Diagnosis Date Noted  . CAP (community acquired pneumonia) [J18.9] 06/22/2015  . Acute respiratory failure ([place]) [J96.00] 06/22/2015  . Acute respiratory failure with hypoxemia ([place]) [J96.01] 06/22/2015  . Scabies [B86] 06/01/2015  . Onychomycosis [B35.1] 01/21/2015  . Nasal congestion [R09.81] 12/17/2014  . Type 2 diabetes mellitus with complication (HCC) [id number]   . Tobacco abuse [Z72.0] 07/22/2014  . COPD (chronic obstructive pulmonary disease) ([place]) [J44.9] 07/08/2014  . COPD exacerbation ([place]) [J44.1] 06/26/2014  . Seborrheic keratoses [L82.1] 12/31/2013  . Thrush [B37.0] 09/19/2013  . Knee pain, bilateral [M25.561, M25.562] 01/22/2013  . Seizure ([place]) [R56.9] 01/04/2013  . Health care maintenance [Z00.00] 11/25/2012  . Folliculitis [id number]  . History of kidney stones [Z87.442] 06/18/2012  . Chronic pain syndrome [G89.4] 06/18/2012  . Muscle spasm [M62.838] 06/18/2012  . Elevated serum creatinine [R74.8] 04/28/2012  . Asthma with acute exacerbation [J45.901] 04/28/2012  . Allergic reaction [T78.40XA] 04/07/2012  . Bipolar 1 disorder ([place]) [F31.9] 12/08/2011  . Itching [L29.9] 09/06/2011  . HSV infection [B00.9] 08/30/2011  . Dyslipidemia [E78.5] 04/24/2011  . Anemia [D64.9] 04/24/2011  . Depression with anxiety [F41.8] 04/24/2011  . Diabetic neuropathy ([place]) [E11.40] 04/24/2011  . OSA on CPAP [G47.33] 10/18/2010  . Asthma [493] 10/18/2010  . Obesity [E66.9] 10/18/2010  . HTN (hypertension) [I10] 10/18/2010  . Type 2 diabetes mellitus ([place]) [E11.9] 10/18/2010    Total Time spent with patient: 30 minutes  Musculoskeletal: Strength & Muscle Tone: within normal limits Gait & Station: normal Patient leans: N/A  Psychiatric Specialty Exam:   Blood pressure 108/61,  pulse 64, temperature 98.4 F (36.9 C), temperature source Oral, resp. rate 20, SpO2 93 %.There is no weight on file to calculate BMI.  General Appearance: Casual  Eye Contact::  Good  Speech:  Clear and Coherent and Normal Rate409  Volume:  Normal  Mood:  Depressed  Affect:  Appropriate and Congruent  Thought Process:  Coherent and Goal Directed  Orientation:  Full (Time, Place, and Person)  Thought Content:  Denies hallucinations, delusions, and paranoia  Suicidal Thoughts:  No  Homicidal Thoughts:  No  Memory:  Immediate;   Good Recent;   Good Remote;   Good  Judgement:  Intact  Insight:  Present  Psychomotor Activity:  Normal  Concentration:  Fair  Recall:  Good  Fund of Knowledge:Good  Language: Good  Akathisia:  No  Handed:  Right  AIMS (if indicated):     Assets:  Communication Skills Desire for Improvement Housing Social Support Transportation  Sleep:     Cognition: WNL  ADL's:  Intact   Mental Status Per Nursing Assessment::   On Admission:     Demographic Factors:  Black Female  Loss Factors: Decline in physical health  Historical Factors: Impulsivity  Risk Reduction Factors:   Living with another person, especially a relative and Positive social support  Continued Clinical Symptoms:  Alcohol/Substance Abuse/Dependencies Previous Psychiatric Diagnoses and Treatments  Cognitive Features That Contribute To Risk:  Closed-mindedness    Suicide Risk:  Minimal: No identifiable suicidal ideation.  Patients presenting with no risk factors but with morbid ruminations; may be classified as minimal risk based on the severity of the depressive symptoms    Plan Of Care/Follow-up recommendations:  Activity:  As tolerated Diet:  Low sodium  Yoshito Gaza, NP 07/11/2015, 2:22 PM

## 2015-07-11 NOTE — ED Notes (Signed)
MD at bedside.  Awaiting son to come visit

## 2015-07-11 NOTE — ED Notes (Signed)
Family at bedside. 

## 2015-07-12 DIAGNOSIS — J440 Chronic obstructive pulmonary disease with acute lower respiratory infection: Secondary | ICD-10-CM | POA: Diagnosis not present

## 2015-07-12 DIAGNOSIS — F319 Bipolar disorder, unspecified: Secondary | ICD-10-CM | POA: Diagnosis not present

## 2015-07-12 DIAGNOSIS — I110 Hypertensive heart disease with heart failure: Secondary | ICD-10-CM | POA: Diagnosis not present

## 2015-07-12 DIAGNOSIS — J45909 Unspecified asthma, uncomplicated: Secondary | ICD-10-CM | POA: Diagnosis not present

## 2015-07-12 DIAGNOSIS — I5032 Chronic diastolic (congestive) heart failure: Secondary | ICD-10-CM | POA: Diagnosis not present

## 2015-07-12 DIAGNOSIS — E1142 Type 2 diabetes mellitus with diabetic polyneuropathy: Secondary | ICD-10-CM | POA: Diagnosis not present

## 2015-07-12 DIAGNOSIS — F419 Anxiety disorder, unspecified: Secondary | ICD-10-CM | POA: Diagnosis not present

## 2015-07-12 DIAGNOSIS — F329 Major depressive disorder, single episode, unspecified: Secondary | ICD-10-CM | POA: Diagnosis not present

## 2015-07-12 DIAGNOSIS — J189 Pneumonia, unspecified organism: Secondary | ICD-10-CM | POA: Diagnosis not present

## 2015-07-16 ENCOUNTER — Other Ambulatory Visit: Payer: Self-pay | Admitting: Family Medicine

## 2015-07-16 ENCOUNTER — Ambulatory Visit (INDEPENDENT_AMBULATORY_CARE_PROVIDER_SITE_OTHER): Payer: Medicare HMO | Admitting: Psychiatry

## 2015-07-16 ENCOUNTER — Other Ambulatory Visit: Payer: Self-pay | Admitting: *Deleted

## 2015-07-16 ENCOUNTER — Encounter (HOSPITAL_COMMUNITY): Payer: Self-pay | Admitting: Psychiatry

## 2015-07-16 VITALS — BP 126/80 | HR 60 | Ht 64.0 in | Wt 254.2 lb

## 2015-07-16 DIAGNOSIS — F200 Paranoid schizophrenia: Secondary | ICD-10-CM | POA: Diagnosis not present

## 2015-07-16 MED ORDER — ESCITALOPRAM OXALATE 20 MG PO TABS: 20.0000 mg | ORAL_TABLET | Freq: Every day | ORAL | Status: DC

## 2015-07-16 MED ORDER — MIRTAZAPINE 30 MG PO TABS: 30.0000 mg | ORAL_TABLET | Freq: Every day | ORAL | Status: DC

## 2015-07-16 MED ORDER — ZIPRASIDONE HCL 80 MG PO CAPS: 80.0000 mg | ORAL_CAPSULE | Freq: Two times a day (BID) | ORAL | Status: DC

## 2015-07-16 NOTE — Progress Notes (Signed)
[place] [id number] Progress Note  Tiffany [name] [id number] 55 y.o.  07/16/2015 10:34 AM  Chief Complaint:  I went to the emergency room because I have a nervous breakdown.  I'm feeling better but is still unable to sleep at night.          History of Present Illness:  Tiffany [name] came for her follow-up appointment.  She was recently went to be emergency room because of paranoia and bizarre behavior .  Apparently family called because she was not taking the medication and having agitation and aggressive behavior to herself and her son.  She stayed in the emergency room few days and then discharged.  She admitted not taking the medication but remains guarded about cocaine intake.  Her UDS was positive for cocaine.  In the beginning she said that she was not using drugs but when I confronted she admitted that maybe she was using cocaine.  We have recommended to do CD IOP on her last visit but patient did not follow-up.  When I ask why she stopped the medication patient did not respond .  She mentioned that she is taking the medication but she feels some time Remeron is not strong enough.  In the past she used to take 30 mg but she gained a lot of weight.  Since we reduced her Remeron her appetite is less and she lost more than 20 pounds.  However patient insists that she need a higher dose of Remeron and she can control her appetite.  Since back on medication she is less paranoid and less irritable.  She has good time with her son and 2 days ago she went to outside with her son and had a good quality time.  [name] seeing [name] for counseling.  Patient denies any suicidal thoughts or homicidal thought.  She admitted using cocaine but did not provide much detail about it and now she agreed to consider CDI preprogrammed.  Since taking medication she denies any agitation, anger, suicidal thoughts or any threatening behavior.  She has no tremors, shakes or any EPS.   Suicidal Ideation: No Plan Formed:  No Patient has means to carry out plan: No  Homicidal Ideation: No Plan Formed: No Patient has means to carry out plan: No Past Psychiatric History/Hospitalization(s): Patient has nervous breakdown in 55 when her daughter died due to cancer.  She has seen multiple psychiatrist and admitted at least 3-4 times since then.  In the past she remembered taking Abilify, Risperdal but did not provide much detail.  As per chart she has taken Wellbutrin, Xanax in the past.  She was getting Klonopin 1 mg 3 times a day, BuSpar, Geodon 80 mg twice a day, Lexapro 30 mg daily and trazodone 300 mg from [place].  Patient denies any history of suicidal attempt but endorsed history of suicidal thoughts.  She endorse history of paranoia, delusion, anxiety and manic-like symptoms.  She was seeing psychiatrist at [facility].  She was taking Anxiety: Yes Bipolar Disorder: Yes Depression: Yes Mania: Yes Psychosis: Yes Schizophrenia: Yes Personality Disorder: No Hospitalization for psychiatric illness: Yes History of Electroconvulsive Shock Therapy: No Prior Suicide Attempts: No  Medical History; Patient has high blood pressure, diabetes mellitus, hyperlipidemia, stroke, seasonal allergies, sleep apnea and questionable seizures.  Her primary care physician is [facility].   Review of Systems  Constitutional: Negative for weight loss and malaise/fatigue.  Cardiovascular: Negative for chest pain and palpitations.  Musculoskeletal: Negative.   Neurological: Negative for  dizziness, tremors and headaches.  Psychiatric/Behavioral: Positive for substance abuse. The patient is nervous/anxious.     Psychiatric: Agitation: No Hallucination: No Depressed Mood: No Insomnia: No Hypersomnia: No Altered Concentration: No Feels Worthless: No Grandiose Ideas: No Belief In Special Powers: No New/Increased Substance Abuse: Yes Compulsions: No  Neurologic: Headache: No Seizure: Patient reported seizures but no  clear documentation Paresthesias: No   Outpatient Encounter Prescriptions as of 07/16/2015  Medication Sig  . ACCU-CHEK SOFTCLIX LANCETS lancets   . albuterol (PROVENTIL HFA;VENTOLIN HFA) 108 (90 BASE) MCG/ACT inhaler Inhale 2 puffs into the lungs every 6 (six) hours as needed.  [name] albuterol (PROVENTIL) (2.5 MG/3ML) 0.083% nebulizer solution Take 3 mLs (2.5 mg total) by nebulization every 6 (six) hours as needed for wheezing or shortness of breath.  [name] amLODipine (NORVASC) 5 MG tablet Take 2 tablets (10 mg total) by mouth daily.  [name] aspirin 81 MG EC tablet TAKE 1 TABLET (81 MG TOTAL) BY MOUTH DAILY.  [name] atorvastatin (LIPITOR) 40 MG tablet Take 1 tablet (40 mg total) by mouth daily.  . Blood Glucose Monitoring Suppl (ACCU-CHEK AVIVA PLUS) w/Device KIT TEST FOUR TIMES DAILY  . escitalopram (LEXAPRO) 20 MG tablet Take 1 tablet (20 mg total) by mouth daily.  . furosemide (LASIX) 40 MG tablet Take 1 tablet (40 mg total) by mouth daily.  [name] gabapentin (NEURONTIN) 600 MG tablet Take 1 tablet (600 mg total) by mouth 3 (three) times daily.  [name] glucose blood test strip Use to check blood sugar 3 times daily.  [name] lisinopril (PRINIVIL,ZESTRIL) 40 MG tablet TAKE 1 TABLET EVERY DAY  . metFORMIN (GLUCOPHAGE-XR) 500 MG 24 hr tablet Take 500 mg by mouth 2 (two) times daily.  . mirtazapine (REMERON) 30 MG tablet Take 1 tablet (30 mg total) by mouth at bedtime.  . montelukast (SINGULAIR) 10 MG tablet Take 1 tablet (10 mg total) by mouth at bedtime.  . Multiple Vitamins-Minerals (MULTIVITAMIN WITH MINERALS) tablet Take 1 tablet by mouth daily.  . naproxen (NAPROSYN) 500 MG tablet Take 1 tablet (500 mg total) by mouth 3 (three) times daily as needed.  [name] omeprazole (PRILOSEC) 40 MG capsule Take 1 capsule (40 mg total) by mouth daily.  [name] oxybutynin (DITROPAN) 5 MG tablet Take 1 tablet (5 mg total) by mouth 3 (three) times daily.  . potassium chloride (K-DUR,KLOR-CON) 10 MEQ tablet TAKE 2 TABLETS EVERY DAY  . SYMBICORT  160-4.5 MCG/ACT inhaler INHALE 2 PUFFS TWICE DAILY  . tiotropium (SPIRIVA HANDIHALER) 18 MCG inhalation capsule Place 1 capsule (18 mcg total) into inhaler and inhale daily.  . verapamil (VERELAN PM) 240 MG 24 hr capsule TAKE 2 CAPSULES AT BEDTIME  . ziprasidone (GEODON) 80 MG capsule Take 1 capsule (80 mg total) by mouth 2 (two) times daily with a meal.  . [DISCONTINUED] escitalopram (LEXAPRO) 20 MG tablet Take 1 tablet (20 mg total) by mouth daily.  . [DISCONTINUED] mirtazapine (REMERON) 15 MG tablet Take 1 tablet (15 mg total) by mouth at bedtime.  . [DISCONTINUED] ziprasidone (GEODON) 80 MG capsule Take 1 capsule (80 mg total) by mouth 2 (two) times daily with a meal.   No facility-administered encounter medications on file as of 07/16/2015.    Recent Results (from the past 2160 hour(s))  HgB A1c     Status: None   Collection Time: 06/01/15  2:35 PM  Result Value Ref Range   Hemoglobin A1C 5.6   Hepatitis C antibody     Status: None   Collection Time:  06/01/15  3:38 PM  Result Value Ref Range   HCV Ab NEGATIVE NEGATIVE  HIV antibody (with reflex)     Status: None   Collection Time: 06/01/15  3:38 PM  Result Value Ref Range   HIV 1&2 Ab, 4th Generation NONREACTIVE NONREACTIVE    Comment:   HIV-1 antigen and HIV-1/HIV-2 antibodies were not detected.  There is no laboratory evidence of HIV infection.   HIV-1/2 Antibody Diff        Not indicated. HIV-1 RNA, Qual TMA          Not indicated.     PLEASE NOTE: This information has been disclosed to you from records whose confidentiality may be protected by state law. If your state requires such protection, then the state law prohibits you from making any further disclosure of the information without the specific written consent of the person to whom it pertains, or as otherwise permitted by law. A general authorization for the release of medical or other information is NOT sufficient for this purpose.   The performance of this  assay has not been clinically validated in patients less than 76 years old.   For additional information please refer to http://education.quest[employer].com/faq/FAQ106.  (This link is being provided for informational/educational purposes only.)     Basic metabolic panel     Status: Abnormal   Collection Time: 06/21/15 10:29 PM  Result Value Ref Range   Sodium 138 135 - 145 mmol/L   Potassium 5.2 (H) 3.5 - 5.1 mmol/L   Chloride 105 101 - 111 mmol/L   CO2 26 22 - 32 mmol/L   Glucose, Bld 99 65 - 99 mg/dL   BUN 17 6 - 20 mg/dL   Creatinine, Ser 1.61 (H) 0.44 - 1.00 mg/dL   Calcium 9.0 8.9 - 10.3 mg/dL   GFR calc non Af Amer 35 (L) >60 mL/min   GFR calc Af Amer 40 (L) >60 mL/min    Comment: (NOTE) The eGFR has been calculated using the CKD EPI equation. This calculation has not been validated in all clinical situations. eGFR's persistently <60 mL/min signify possible Chronic Kidney Disease.    Anion gap 7 5 - 15  CBC     Status: Abnormal   Collection Time: 06/21/15 10:29 PM  Result Value Ref Range   WBC 18.9 (H) 4.0 - 10.5 K/uL   RBC 3.81 (L) 3.87 - 5.11 MIL/uL   Hemoglobin 10.8 (L) 12.0 - 15.0 g/dL   HCT 35.8 (L) 36.0 - 46.0 %   MCV 94.0 78.0 - 100.0 fL   MCH 28.3 26.0 - 34.0 pg   MCHC 30.2 30.0 - 36.0 g/dL   RDW 15.8 (H) 11.5 - 15.5 %   Platelets 499 (H) 150 - 400 K/uL  I-Stat venous blood gas, ED     Status: Abnormal   Collection Time: 06/21/15 10:31 PM  Result Value Ref Range   pH, Ven 7.198 (LL) 7.250 - 7.300   pCO2, Ven 60.1 (H) 45.0 - 50.0 mmHg   pO2, Ven 80.0 (H) 31.0 - 45.0 mmHg   Bicarbonate 23.4 20.0 - 24.0 mEq/L   TCO2 25 0 - 100 mmol/L   O2 Saturation 92.0 %   Acid-base deficit 5.0 (H) 0.0 - 2.0 mmol/L   Patient temperature 98.6 F    Collection site RADIAL, ALLEN'S TEST ACCEPTABLE    Drawn by RT    Sample type VENOUS    Comment NOTIFIED PHYSICIAN   Brain natriuretic peptide     Status:  Abnormal   Collection Time: 06/21/15 11:01 PM  Result Value Ref  Range   B Natriuretic Peptide 261.8 (H) 0.0 - 100.0 pg/mL  Blood Culture (routine x 2)     Status: None   Collection Time: 06/21/15 11:07 PM  Result Value Ref Range   Specimen Description BLOOD RIGHT ARM    Special Requests BOTTLES DRAWN AEROBIC AND ANAEROBIC 5ML    Culture NO GROWTH 5 DAYS    Report Status 06/27/2015 FINAL   I-stat troponin, ED     Status: None   Collection Time: 06/21/15 11:12 PM  Result Value Ref Range   Troponin i, poc 0.02 0.00 - 0.08 ng/mL   Comment 3            Comment: Due to the release kinetics of cTnI, a negative result within the first hours of the onset of symptoms does not rule out myocardial infarction with certainty. If myocardial infarction is still suspected, repeat the test at appropriate intervals.   I-Stat Arterial Blood Gas, ED - (order at [facility] and [facility] only)     Status: Abnormal   Collection Time: 06/21/15 11:14 PM  Result Value Ref Range   pH, Arterial 7.229 (L) 7.350 - 7.450   pCO2 arterial 59.7 (HH) 35.0 - 45.0 mmHg   pO2, Arterial 67.0 (L) 80.0 - 100.0 mmHg   Bicarbonate 24.9 (H) 20.0 - 24.0 mEq/L   TCO2 27 0 - 100 mmol/L   O2 Saturation 88.0 %   Acid-base deficit 3.0 (H) 0.0 - 2.0 mmol/L   Patient temperature 98.6 F    Collection site RADIAL, ALLEN'S TEST ACCEPTABLE    Drawn by RT    Sample type ARTERIAL    Comment NOTIFIED PHYSICIAN   I-Stat CG4 Lactic Acid, ED  (not at  [facility])     Status: None   Collection Time: 06/21/15 11:15 PM  Result Value Ref Range   Lactic Acid, Venous 1.35 0.5 - 2.0 mmol/L  Blood Culture (routine x 2)     Status: None   Collection Time: 06/21/15 11:22 PM  Result Value Ref Range   Specimen Description BLOOD RIGHT ARM    Special Requests BOTTLES DRAWN AEROBIC AND ANAEROBIC 5ML    Culture NO GROWTH 5 DAYS    Report Status 06/27/2015 FINAL   I-Stat arterial blood gas, ED     Status: Abnormal   Collection Time: 06/22/15  3:50 AM  Result Value Ref Range   pH, Arterial 7.184 (LL) 7.350 - 7.450   pCO2  arterial 64.4 (HH) 35.0 - 45.0 mmHg   pO2, Arterial 88.0 80.0 - 100.0 mmHg   Bicarbonate 24.3 (H) 20.0 - 24.0 mEq/L   TCO2 26 0 - 100 mmol/L   O2 Saturation 94.0 %   Acid-base deficit 5.0 (H) 0.0 - 2.0 mmol/L   Patient temperature 98.6 F    Collection site RADIAL, ALLEN'S TEST ACCEPTABLE    Drawn by RT    Sample type ARTERIAL    Comment NOTIFIED PHYSICIAN   I-Stat arterial blood gas, ED     Status: Abnormal   Collection Time: 06/22/15  5:12 AM  Result Value Ref Range   pH, Arterial 7.206 (L) 7.350 - 7.450   pCO2 arterial 64.6 (HH) 35.0 - 45.0 mmHg   pO2, Arterial 71.0 (L) 80.0 - 100.0 mmHg   Bicarbonate 25.6 (H) 20.0 - 24.0 mEq/L   TCO2 28 0 - 100 mmol/L   O2 Saturation 89.0 %   Acid-base deficit 3.0 (H) 0.0 -  2.0 mmol/L   Patient temperature 98.6 F    Collection site RADIAL, ALLEN'S TEST ACCEPTABLE    Drawn by RT    Sample type ARTERIAL    Comment NOTIFIED PHYSICIAN   Basic metabolic panel     Status: Abnormal   Collection Time: 06/22/15  5:20 AM  Result Value Ref Range   Sodium 137 135 - 145 mmol/L   Potassium 5.4 (H) 3.5 - 5.1 mmol/L   Chloride 102 101 - 111 mmol/L   CO2 23 22 - 32 mmol/L   Glucose, Bld 89 65 - 99 mg/dL   BUN 19 6 - 20 mg/dL   Creatinine, Ser 1.76 (H) 0.44 - 1.00 mg/dL   Calcium 8.7 (L) 8.9 - 10.3 mg/dL   GFR calc non Af Amer 31 (L) >60 mL/min   GFR calc Af Amer 36 (L) >60 mL/min    Comment: (NOTE) The eGFR has been calculated using the CKD EPI equation. This calculation has not been validated in all clinical situations. eGFR's persistently <60 mL/min signify possible Chronic Kidney Disease.    Anion gap 12 5 - 15  CBC     Status: Abnormal   Collection Time: 06/22/15  5:20 AM  Result Value Ref Range   WBC 16.6 (H) 4.0 - 10.5 K/uL   RBC 3.53 (L) 3.87 - 5.11 MIL/uL   Hemoglobin 9.8 (L) 12.0 - 15.0 g/dL   HCT 33.3 (L) 36.0 - 46.0 %   MCV 94.3 78.0 - 100.0 fL   MCH 27.8 26.0 - 34.0 pg   MCHC 29.4 (L) 30.0 - 36.0 g/dL   RDW 15.9 (H) 11.5 - 15.5 %    Platelets 432 (H) 150 - 400 K/uL  CBG monitoring, ED     Status: None   Collection Time: 06/22/15  5:55 AM  Result Value Ref Range   Glucose-Capillary 88 65 - 99 mg/dL  MRSA PCR Screening     Status: None   Collection Time: 06/22/15  6:40 AM  Result Value Ref Range   MRSA by PCR NEGATIVE NEGATIVE    Comment:        The GeneXpert MRSA Assay (FDA approved for NASAL specimens only), is one component of a comprehensive MRSA colonization surveillance program. It is not intended to diagnose MRSA infection nor to guide or monitor treatment for MRSA infections.   Glucose, capillary     Status: None   Collection Time: 06/22/15  7:17 AM  Result Value Ref Range   Glucose-Capillary 88 65 - 99 mg/dL  Strep pneumoniae urinary antigen     Status: None   Collection Time: 06/22/15  9:37 AM  Result Value Ref Range   Strep Pneumo Urinary Antigen NEGATIVE NEGATIVE    Comment:        Infection due to S. pneumoniae cannot be absolutely ruled out since the antigen present may be below the detection limit of the test.   Legionella Pneumophila Serogp 1 Ur Ag     Status: None   Collection Time: 06/22/15  9:38 AM  Result Value Ref Range   L. pneumophila Serogp 1 Ur Ag Negative Negative    Comment: (NOTE) Presumptive negative for L. pneumophila serogroup 1 antigen in urine, suggesting no recent or current infection. Legionnaires' disease cannot be ruled out since other serogroups and species may also cause disease. Performed At: [facility] [place], [place] [id number] Lindon Romp MD [id number]    Source of Sample URINE, RANDOM   Glucose, capillary  Status: Abnormal   Collection Time: 06/22/15 11:44 AM  Result Value Ref Range   Glucose-Capillary 102 (H) 65 - 99 mg/dL  Glucose, capillary     Status: Abnormal   Collection Time: 06/22/15  2:55 PM  Result Value Ref Range   Glucose-Capillary 120 (H) 65 - 99 mg/dL  Glucose, capillary     Status: Abnormal    Collection Time: 06/22/15  8:25 PM  Result Value Ref Range   Glucose-Capillary 135 (H) 65 - 99 mg/dL   Comment 1 Notify RN   Glucose, capillary     Status: Abnormal   Collection Time: 06/22/15 11:40 PM  Result Value Ref Range   Glucose-Capillary 147 (H) 65 - 99 mg/dL   Comment 1 Notify RN   Glucose, capillary     Status: Abnormal   Collection Time: 06/23/15  4:14 AM  Result Value Ref Range   Glucose-Capillary 136 (H) 65 - 99 mg/dL   Comment 1 Notify RN   Glucose, capillary     Status: Abnormal   Collection Time: 06/23/15  7:28 AM  Result Value Ref Range   Glucose-Capillary 127 (H) 65 - 99 mg/dL  Glucose, capillary     Status: Abnormal   Collection Time: 06/23/15 11:51 AM  Result Value Ref Range   Glucose-Capillary 123 (H) 65 - 99 mg/dL  Basic metabolic panel     Status: Abnormal   Collection Time: 06/23/15  2:38 PM  Result Value Ref Range   Sodium 141 135 - 145 mmol/L   Potassium 5.3 (H) 3.5 - 5.1 mmol/L   Chloride 108 101 - 111 mmol/L   CO2 24 22 - 32 mmol/L   Glucose, Bld 154 (H) 65 - 99 mg/dL   BUN 29 (H) 6 - 20 mg/dL   Creatinine, Ser 1.68 (H) 0.44 - 1.00 mg/dL   Calcium 9.2 8.9 - 10.3 mg/dL   GFR calc non Af Amer 33 (L) >60 mL/min   GFR calc Af Amer 38 (L) >60 mL/min    Comment: (NOTE) The eGFR has been calculated using the CKD EPI equation. This calculation has not been validated in all clinical situations. eGFR's persistently <60 mL/min signify possible Chronic Kidney Disease.    Anion gap 9 5 - 15  Glucose, capillary     Status: Abnormal   Collection Time: 06/23/15  3:41 PM  Result Value Ref Range   Glucose-Capillary 157 (H) 65 - 99 mg/dL  Glucose, capillary     Status: Abnormal   Collection Time: 06/23/15  7:31 PM  Result Value Ref Range   Glucose-Capillary 175 (H) 65 - 99 mg/dL  Glucose, capillary     Status: Abnormal   Collection Time: 06/23/15 11:20 PM  Result Value Ref Range   Glucose-Capillary 208 (H) 65 - 99 mg/dL  CBC     Status: Abnormal    Collection Time: 06/24/15  2:21 AM  Result Value Ref Range   WBC 19.8 (H) 4.0 - 10.5 K/uL   RBC 3.39 (L) 3.87 - 5.11 MIL/uL   Hemoglobin 9.4 (L) 12.0 - 15.0 g/dL   HCT 32.1 (L) 36.0 - 46.0 %   MCV 94.7 78.0 - 100.0 fL   MCH 27.7 26.0 - 34.0 pg   MCHC 29.3 (L) 30.0 - 36.0 g/dL   RDW 16.1 (H) 11.5 - 15.5 %   Platelets 478 (H) 150 - 400 K/uL  Basic metabolic panel     Status: Abnormal   Collection Time: 06/24/15  2:21 AM  Result Value  Ref Range   Sodium 138 135 - 145 mmol/L   Potassium 5.2 (H) 3.5 - 5.1 mmol/L   Chloride 105 101 - 111 mmol/L   CO2 26 22 - 32 mmol/L   Glucose, Bld 108 (H) 65 - 99 mg/dL   BUN 25 (H) 6 - 20 mg/dL   Creatinine, Ser 1.46 (H) 0.44 - 1.00 mg/dL   Calcium 9.1 8.9 - 10.3 mg/dL   GFR calc non Af Amer 39 (L) >60 mL/min   GFR calc Af Amer 45 (L) >60 mL/min    Comment: (NOTE) The eGFR has been calculated using the CKD EPI equation. This calculation has not been validated in all clinical situations. eGFR's persistently <60 mL/min signify possible Chronic Kidney Disease.    Anion gap 7 5 - 15  Magnesium     Status: None   Collection Time: 06/24/15  2:21 AM  Result Value Ref Range   Magnesium 2.2 1.7 - 2.4 mg/dL  Phosphorus     Status: Abnormal   Collection Time: 06/24/15  2:21 AM  Result Value Ref Range   Phosphorus 2.2 (L) 2.5 - 4.6 mg/dL  Glucose, capillary     Status: Abnormal   Collection Time: 06/24/15  3:27 AM  Result Value Ref Range   Glucose-Capillary 125 (H) 65 - 99 mg/dL  Blood gas, arterial     Status: Abnormal   Collection Time: 06/24/15  3:36 AM  Result Value Ref Range   O2 Content 6.0 L/min   Delivery systems NASAL CANNULA    pH, Arterial 7.301 (L) 7.350 - 7.450   pCO2 arterial 53.0 (H) 35.0 - 45.0 mmHg   pO2, Arterial 63.6 (L) 80.0 - 100.0 mmHg   Bicarbonate 25.4 (H) 20.0 - 24.0 mEq/L   TCO2 27.0 0 - 100 mmol/L   Acid-base deficit 0.3 0.0 - 2.0 mmol/L   O2 Saturation 89.8 %   Patient temperature 98.6    Collection site RIGHT  RADIAL    Drawn by [phone]    Sample type ARTERIAL DRAW    Allens test (pass/fail) PASS PASS  Glucose, capillary     Status: Abnormal   Collection Time: 06/24/15  8:38 AM  Result Value Ref Range   Glucose-Capillary 105 (H) 65 - 99 mg/dL  Basic metabolic panel     Status: Abnormal   Collection Time: 06/24/15  8:47 AM  Result Value Ref Range   Sodium 140 135 - 145 mmol/L   Potassium 5.4 (H) 3.5 - 5.1 mmol/L   Chloride 107 101 - 111 mmol/L   CO2 25 22 - 32 mmol/L   Glucose, Bld 107 (H) 65 - 99 mg/dL   BUN 26 (H) 6 - 20 mg/dL   Creatinine, Ser 1.47 (H) 0.44 - 1.00 mg/dL   Calcium 9.4 8.9 - 10.3 mg/dL   GFR calc non Af Amer 39 (L) >60 mL/min   GFR calc Af Amer 45 (L) >60 mL/min    Comment: (NOTE) The eGFR has been calculated using the CKD EPI equation. This calculation has not been validated in all clinical situations. eGFR's persistently <60 mL/min signify possible Chronic Kidney Disease.    Anion gap 8 5 - 15  CBC     Status: Abnormal   Collection Time: 06/24/15  8:47 AM  Result Value Ref Range   WBC 20.2 (H) 4.0 - 10.5 K/uL   RBC 3.61 (L) 3.87 - 5.11 MIL/uL   Hemoglobin 10.0 (L) 12.0 - 15.0 g/dL   HCT 33.8 (L) 36.0 -  46.0 %   MCV 93.6 78.0 - 100.0 fL   MCH 27.7 26.0 - 34.0 pg   MCHC 29.6 (L) 30.0 - 36.0 g/dL   RDW 16.1 (H) 11.5 - 15.5 %   Platelets 464 (H) 150 - 400 K/uL  Glucose, capillary     Status: Abnormal   Collection Time: 06/24/15 12:17 PM  Result Value Ref Range   Glucose-Capillary 129 (H) 65 - 99 mg/dL  Blood gas, arterial     Status: Abnormal   Collection Time: 06/24/15  3:10 PM  Result Value Ref Range   O2 Content 5.0 L/min   Delivery systems NASAL CANNULA    pH, Arterial 7.272 (L) 7.350 - 7.450   pCO2 arterial 57.1 (HH) 35.0 - 45.0 mmHg    Comment:  CYBILL ECKELMANN,RN AT 1531, BY TAMMIE READLING RRT,RCP ON 06/24/15   pO2, Arterial 64.4 (L) 80.0 - 100.0 mmHg   Bicarbonate 25.5 (H) 20.0 - 24.0 mEq/L   TCO2 27.3 0 - 100 mmol/L   Acid-base deficit 0.5 0.0 -  2.0 mmol/L   O2 Saturation 88.1 %   Patient temperature 98.6    Collection site RIGHT RADIAL    Drawn by [phone]    Sample type ARTERIAL DRAW    Allens test (pass/fail) PASS PASS  Glucose, capillary     Status: Abnormal   Collection Time: 06/24/15  4:23 PM  Result Value Ref Range   Glucose-Capillary 116 (H) 65 - 99 mg/dL  Glucose, capillary     Status: Abnormal   Collection Time: 06/24/15  9:18 PM  Result Value Ref Range   Glucose-Capillary 138 (H) 65 - 99 mg/dL  Glucose, capillary     Status: Abnormal   Collection Time: 06/24/15 11:36 PM  Result Value Ref Range   Glucose-Capillary 111 (H) 65 - 99 mg/dL  Blood gas, arterial     Status: Abnormal   Collection Time: 06/25/15  1:10 AM  Result Value Ref Range   FIO2 0.40    Delivery systems BILEVEL POSITIVE AIRWAY PRESSURE    Mode BILEVEL POSITIVE AIRWAY PRESSURE    Inspiratory PAP 14    Expiratory PAP 6    pH, Arterial 7.365 7.350 - 7.450   pCO2 arterial 49.3 (H) 35.0 - 45.0 mmHg   pO2, Arterial 70.4 (L) 80.0 - 100.0 mmHg   Bicarbonate 27.5 (H) 20.0 - 24.0 mEq/L   TCO2 29.0 0 - 100 mmol/L   Acid-Base Excess 2.6 (H) 0.0 - 2.0 mmol/L   O2 Saturation 93.0 %   Patient temperature 98.6    Collection site RIGHT RADIAL    Drawn by [phone]    Sample type ARTERIAL DRAW    Allens test (pass/fail) PASS PASS  Basic metabolic panel     Status: Abnormal   Collection Time: 06/25/15  4:06 AM  Result Value Ref Range   Sodium 140 135 - 145 mmol/L   Potassium 4.9 3.5 - 5.1 mmol/L   Chloride 105 101 - 111 mmol/L   CO2 26 22 - 32 mmol/L   Glucose, Bld 107 (H) 65 - 99 mg/dL   BUN 28 (H) 6 - 20 mg/dL   Creatinine, Ser 1.48 (H) 0.44 - 1.00 mg/dL   Calcium 9.4 8.9 - 10.3 mg/dL   GFR calc non Af Amer 38 (L) >60 mL/min   GFR calc Af Amer 45 (L) >60 mL/min    Comment: (NOTE) The eGFR has been calculated using the CKD EPI equation. This calculation has not been validated in all  clinical situations. eGFR's persistently <60 mL/min signify possible  Chronic Kidney Disease.    Anion gap 9 5 - 15  CBC     Status: Abnormal   Collection Time: 06/25/15  4:06 AM  Result Value Ref Range   WBC 18.9 (H) 4.0 - 10.5 K/uL   RBC 3.61 (L) 3.87 - 5.11 MIL/uL   Hemoglobin 10.0 (L) 12.0 - 15.0 g/dL   HCT 33.6 (L) 36.0 - 46.0 %   MCV 93.1 78.0 - 100.0 fL   MCH 27.7 26.0 - 34.0 pg   MCHC 29.8 (L) 30.0 - 36.0 g/dL   RDW 16.2 (H) 11.5 - 15.5 %   Platelets 537 (H) 150 - 400 K/uL  Glucose, capillary     Status: Abnormal   Collection Time: 06/25/15  5:07 AM  Result Value Ref Range   Glucose-Capillary 108 (H) 65 - 99 mg/dL  Glucose, capillary     Status: Abnormal   Collection Time: 06/25/15  8:15 AM  Result Value Ref Range   Glucose-Capillary 107 (H) 65 - 99 mg/dL  Glucose, capillary     Status: None   Collection Time: 06/25/15 11:32 AM  Result Value Ref Range   Glucose-Capillary 81 65 - 99 mg/dL  Comprehensive metabolic panel     Status: Abnormal   Collection Time: 07/09/15  1:25 PM  Result Value Ref Range   Sodium 139 135 - 145 mmol/L   Potassium 3.3 (L) 3.5 - 5.1 mmol/L   Chloride 103 101 - 111 mmol/L   CO2 27 22 - 32 mmol/L   Glucose, Bld 107 (H) 65 - 99 mg/dL   BUN 13 6 - 20 mg/dL   Creatinine, Ser 0.98 0.44 - 1.00 mg/dL   Calcium 9.5 8.9 - 10.3 mg/dL   Total Protein 7.3 6.5 - 8.1 g/dL   Albumin 3.9 3.5 - 5.0 g/dL   AST 15 15 - 41 U/L   ALT 15 14 - 54 U/L   Alkaline Phosphatase 100 38 - 126 U/L   Total Bilirubin 1.5 (H) 0.3 - 1.2 mg/dL   GFR calc non Af Amer >60 >60 mL/min   GFR calc Af Amer >60 >60 mL/min    Comment: (NOTE) The eGFR has been calculated using the CKD EPI equation. This calculation has not been validated in all clinical situations. eGFR's persistently <60 mL/min signify possible Chronic Kidney Disease.    Anion gap 9 5 - 15  Ethanol     Status: None   Collection Time: 07/09/15  1:25 PM  Result Value Ref Range   Alcohol, Ethyl (B) <5 <5 mg/dL    Comment:        LOWEST DETECTABLE LIMIT FOR SERUM ALCOHOL IS 5  mg/dL FOR MEDICAL PURPOSES ONLY   Salicylate level     Status: None   Collection Time: 07/09/15  1:25 PM  Result Value Ref Range   Salicylate Lvl <2.5 2.8 - 30.0 mg/dL  Acetaminophen level     Status: Abnormal   Collection Time: 07/09/15  1:25 PM  Result Value Ref Range   Acetaminophen (Tylenol), Serum <10 (L) 10 - 30 ug/mL    Comment:        THERAPEUTIC CONCENTRATIONS VARY SIGNIFICANTLY. A RANGE OF 10-30 ug/mL MAY BE AN EFFECTIVE CONCENTRATION FOR MANY PATIENTS. HOWEVER, SOME ARE BEST TREATED AT CONCENTRATIONS OUTSIDE THIS RANGE. ACETAMINOPHEN CONCENTRATIONS >150 ug/mL AT 4 HOURS AFTER INGESTION AND >50 ug/mL AT 12 HOURS AFTER INGESTION ARE OFTEN ASSOCIATED WITH TOXIC REACTIONS.   cbc  Status: Abnormal   Collection Time: 07/09/15  1:25 PM  Result Value Ref Range   WBC 13.9 (H) 4.0 - 10.5 K/uL   RBC 4.33 3.87 - 5.11 MIL/uL   Hemoglobin 12.5 12.0 - 15.0 g/dL   HCT 37.9 36.0 - 46.0 %   MCV 87.5 78.0 - 100.0 fL   MCH 28.9 26.0 - 34.0 pg   MCHC 33.0 30.0 - 36.0 g/dL   RDW 17.1 (H) 11.5 - 15.5 %   Platelets 525 (H) 150 - 400 K/uL  Rapid urine drug screen (hospital performed)     Status: Abnormal   Collection Time: 07/09/15 11:20 PM  Result Value Ref Range   Opiates NONE DETECTED NONE DETECTED   Cocaine POSITIVE (A) NONE DETECTED   Benzodiazepines NONE DETECTED NONE DETECTED   Amphetamines NONE DETECTED NONE DETECTED   Tetrahydrocannabinol NONE DETECTED NONE DETECTED   Barbiturates NONE DETECTED NONE DETECTED    Comment:        DRUG SCREEN FOR MEDICAL PURPOSES ONLY.  IF CONFIRMATION IS NEEDED FOR ANY PURPOSE, NOTIFY LAB WITHIN 5 DAYS.        LOWEST DETECTABLE LIMITS FOR URINE DRUG SCREEN Drug Class       Cutoff (ng/mL) Amphetamine      1000 Barbiturate      200 Benzodiazepine   703 Tricyclics       500 Opiates          300 Cocaine          300 THC              50       Constitutional:  BP 126/80 mmHg  Pulse 60  Ht 5' 4" (1.626 m)  Wt 254 lb 3.2 oz  (115.304 kg)  BMI 43.61 kg/m2  SpO2 89%   Musculoskeletal: Strength & Muscle Tone: within normal limits Gait & Station: normal Patient leans: N/A  Psychiatric Specialty Exam: General Appearance: Casual, Fairly Groomed and Guarded  Engineer, water::  Fair  Speech:  Slow  Volume:  Normal  Mood:  Anxious, Depressed and Irritable  Affect:  Appropriate  Thought Process:  Goal Directed  Orientation:  Full (Time, Place, and Person)  Thought Content:  WDL  Suicidal Thoughts:  No  Homicidal Thoughts:  No  Memory:  Immediate;   Fair Recent;   Fair Remote;   Fair  Judgement:  Fair  Insight:  Fair  Psychomotor Activity:  Normal  Concentration:  Fair  Recall:  [employer] of Knowledge:  Fair  Language:  Fair  Akathisia:  No  Handed:  Right  AIMS (if indicated):     Assets:  Desire for Improvement Financial Resources/Insurance Housing Social Support  ADL's:  Intact  Cognition:  WNL  Sleep:        Established Problem, Stable/Improving (1), Review of Psycho-Social Stressors (1), Review or order clinical lab tests (1), Review and summation of old records (2), Established Problem, Worsening (2), Review of Last Therapy Session (1), Review of Medication Regimen & Side Effects (2) and Review of New Medication or Change in Dosage (2)  Assessment: Axis I: Paranoid schizophrenia chronic, bipolar disorder depressed type, anxiety disorder NOS, cocaine abuse  Axis II: Deferred  Axis III:  Past Medical History  Diagnosis Date  . High blood pressure   . Incontinence   . Anxiety   . Manic depression ([place])   . Heart attack ([place])   . Diabetes mellitus     class 2  . Asthma   .  Emphysema   . High cholesterol   . Stroke ([place])   . Sinus trouble   . Seasonal allergies   . Sleep apnea   . Disorder of nervous system   . Myocardial infarct ([place])   . Complication of anesthesia     decreased bp, decreased heart rate  . Pneumonia 06/2015  . Seizures ([place])      Plan:  I reviewed  discharge summary from emergency room, blood work results, UDS positive for cocaine and her current medication.  She insists that she needed to take a higher dose of Remeron despite it has caused weight gain in the past.  I offer Vistaril but patient refused.  She had tried trazodone with limited response.  She promised that she will watch her appetite closely .  She also interested in [facility].  I will increase Remeron 30 mg daily, continue Geodon 80 mg twice a day and Lexapro 20 mg daily.  She has no tremors, shakes or any EPS.  Recommended to keep appointment with [name].  We will refer to [facility] for substance abuse treatment. Recommended to call us back if she has any question or any concern.  Follow-up in 3 months. Discuss safety plan that anytime having active suicidal thoughts or homicidal thoughts then patient need to call 911 or go to the local emergency room.   ARFEEN,SYED T., MD 07/16/2015

## 2015-07-16 NOTE — Patient Outreach (Signed)
Weekly transition of care call placed to member.  She state she is "alright."  She report that she was seen at her psychiatrist office today, state she has been taking medications as prescribed.  She does state that she did not take her oxygen with her to the appointment and her oxygen level was 87%.  She confirms that she does have the portable oxygen tanks, but state she didn't feel like taking it with her.  Advised to wear oxygen as instructed, especially when she is out.  She denies rescheduling her appointment with the pulmonologist due to not having the copay.  She state she will not make the appointment until she is sure she will have the money.  This care manager inquired about her blood sugar checks, she denies stating that she still do not have any strips.  Advised that according to notes, a prescription for strips were called into her pharmacy.  It is unclear if it was called to [facility] or [facility] mail order.  She state that she has the number to both and will call to check on the status.    She still has a cough, reports minimal sputum production, and some shortness of breath, but she state its not more than usual.  She denies any further concerns.  Will follow up next week with transition of care call, home visit scheduled within the next 2 weeks.  [name], [place], MSN [place] [phone]

## 2015-07-19 ENCOUNTER — Ambulatory Visit: Payer: Self-pay | Admitting: Family Medicine

## 2015-07-22 ENCOUNTER — Other Ambulatory Visit: Payer: Self-pay | Admitting: *Deleted

## 2015-07-22 NOTE — Patient Outreach (Signed)
Weekly transition of care call placed to member, no answer.  HIPAA compliant voice message left.  Will await call back, if no call back will follow up next week, prior to scheduled home visit.  [name], [place], [place] [phone]

## 2015-07-25 DIAGNOSIS — R0602 Shortness of breath: Secondary | ICD-10-CM | POA: Diagnosis not present

## 2015-07-25 DIAGNOSIS — J440 Chronic obstructive pulmonary disease with acute lower respiratory infection: Secondary | ICD-10-CM | POA: Diagnosis not present

## 2015-07-25 DIAGNOSIS — G4733 Obstructive sleep apnea (adult) (pediatric): Secondary | ICD-10-CM | POA: Diagnosis not present

## 2015-07-25 DIAGNOSIS — J45909 Unspecified asthma, uncomplicated: Secondary | ICD-10-CM | POA: Diagnosis not present

## 2015-07-25 DIAGNOSIS — J189 Pneumonia, unspecified organism: Secondary | ICD-10-CM | POA: Diagnosis not present

## 2015-07-25 DIAGNOSIS — R062 Wheezing: Secondary | ICD-10-CM | POA: Diagnosis not present

## 2015-07-26 ENCOUNTER — Ambulatory Visit (INDEPENDENT_AMBULATORY_CARE_PROVIDER_SITE_OTHER): Payer: Commercial Managed Care - HMO | Admitting: Family Medicine

## 2015-07-26 ENCOUNTER — Encounter: Payer: Self-pay | Admitting: Family Medicine

## 2015-07-26 VITALS — BP 144/80 | HR 86 | Temp 98.6°F | Ht 64.0 in | Wt 257.0 lb

## 2015-07-26 DIAGNOSIS — Z720 Tobacco use: Secondary | ICD-10-CM

## 2015-07-26 DIAGNOSIS — J449 Chronic obstructive pulmonary disease, unspecified: Secondary | ICD-10-CM

## 2015-07-26 MED ORDER — METFORMIN HCL ER 500 MG PO TB24: 500.0000 mg | ORAL_TABLET | Freq: Two times a day (BID) | ORAL | Status: DC

## 2015-07-26 MED ORDER — TIOTROPIUM BROMIDE MONOHYDRATE 18 MCG IN CAPS: 18.0000 ug | ORAL_CAPSULE | Freq: Every day | RESPIRATORY_TRACT | Status: DC

## 2015-07-26 MED ORDER — MONTELUKAST SODIUM 10 MG PO TABS: 10.0000 mg | ORAL_TABLET | Freq: Every day | ORAL | Status: DC

## 2015-07-26 MED ORDER — FLUTICASONE FUROATE-VILANTEROL 100-25 MCG/INH IN AEPB: 1.0000 | INHALATION_SPRAY | Freq: Every day | RESPIRATORY_TRACT | Status: DC

## 2015-07-26 MED ORDER — GABAPENTIN 600 MG PO TABS: 600.0000 mg | ORAL_TABLET | Freq: Three times a day (TID) | ORAL | Status: DC

## 2015-07-26 MED ORDER — LORATADINE 10 MG PO TABS: 10.0000 mg | ORAL_TABLET | Freq: Every day | ORAL | Status: DC

## 2015-07-26 MED ORDER — LISINOPRIL 40 MG PO TABS: 40.0000 mg | ORAL_TABLET | Freq: Every day | ORAL | Status: DC

## 2015-07-26 MED ORDER — POTASSIUM CHLORIDE CRYS ER 10 MEQ PO TBCR: 20.0000 meq | EXTENDED_RELEASE_TABLET | Freq: Every day | ORAL | Status: DC

## 2015-07-26 MED ORDER — FUROSEMIDE 40 MG PO TABS: 40.0000 mg | ORAL_TABLET | Freq: Every day | ORAL | Status: DC

## 2015-07-26 MED ORDER — NAPROXEN 500 MG PO TABS: 500.0000 mg | ORAL_TABLET | Freq: Three times a day (TID) | ORAL | Status: DC | PRN

## 2015-07-26 MED ORDER — ALBUTEROL SULFATE HFA 108 (90 BASE) MCG/ACT IN AERS: 2.0000 | INHALATION_SPRAY | Freq: Four times a day (QID) | RESPIRATORY_TRACT | Status: DC | PRN

## 2015-07-26 MED ORDER — ATORVASTATIN CALCIUM 40 MG PO TABS: 40.0000 mg | ORAL_TABLET | Freq: Every day | ORAL | Status: DC

## 2015-07-26 MED ORDER — VERAPAMIL HCL ER 240 MG PO CP24: 480.0000 mg | ORAL_CAPSULE | Freq: Every day | ORAL | Status: DC

## 2015-07-26 NOTE — Progress Notes (Signed)
Subjective: CC: Hospital follow-up for COPD HPI: Patient is a 55 y.o. female with a past medical history of COPD, diabetes, depression and anxiety, bipolar disorder, presenting to clinic today for a hospital follow-up due to COPD.  COPD:  During her last hospital position, she was started on home oxygen. This is not the first time she has been on home oxygen. She hasn't established with a pulmonologist as it is a $45 co-pay.  She's not tried to discuss this with her [facility] care manager. She left her O2 at home. She notes she has portable oxygen, but does not like to travel with it. She wears 6L [place] at home. She feels exhausted and is coughing all the time. She feels stable from where she was on discharge. She denies any increased sputum production. No fevers or chills. She does note worsening allergies. She also noticed increased swelling in her legs. She notes that she was 254 pounds at her psychiatry appointment on 6/30 and is currently 257 pounds. [name] not wears at home. His been taking Breo Ellipta and  Spiriva. She's also been taking Lasix 40 mg daily as directed.  Social History: She continues to smoke. She forgot to talk to her psychiatrist about possibly starting chantix.   ROS: All other systems reviewed and are negative.  Past Medical History Patient Active Problem List   Diagnosis Date Noted  . CAP (community acquired pneumonia) 06/22/2015  . Acute respiratory failure ([place]) 06/22/2015  . Acute respiratory failure with hypoxemia ([place]) 06/22/2015  . Scabies 06/01/2015  . Onychomycosis 01/21/2015  . Nasal congestion 12/17/2014  . Type 2 diabetes mellitus with complication ([place])   . Tobacco abuse 07/22/2014  . COPD (chronic obstructive pulmonary disease) ([place]) 07/08/2014  . COPD exacerbation ([place]) 06/26/2014  . Seborrheic keratoses 12/31/2013  . Thrush 09/19/2013  . Knee pain, bilateral 01/22/2013  . Seizure ([place]) 01/04/2013  . Health care maintenance 11/25/2012  .  Folliculitis 55/97/4163  . History of kidney stones 06/18/2012  . Chronic pain syndrome 06/18/2012  . Muscle spasm 06/18/2012  . Elevated serum creatinine 04/28/2012  . Asthma with acute exacerbation 04/28/2012  . Allergic reaction 04/07/2012  . Bipolar 1 disorder ([place]) 12/08/2011  . Itching 09/06/2011  . HSV infection 08/30/2011  . Dyslipidemia 04/24/2011  . Anemia 04/24/2011  . Depression with anxiety 04/24/2011  . Diabetic neuropathy ([place]) 04/24/2011  . OSA on CPAP 10/18/2010  . Asthma 10/18/2010  . Obesity 10/18/2010  . HTN (hypertension) 10/18/2010  . Type 2 diabetes mellitus ([place]) 10/18/2010    Medications- reviewed and updated Current Outpatient Prescriptions  Medication Sig Dispense Refill  . ACCU-CHEK SOFTCLIX LANCETS lancets     . albuterol (PROVENTIL HFA;VENTOLIN HFA) 108 (90 Base) MCG/ACT inhaler Inhale 2 puffs into the lungs every 6 (six) hours as needed. 1 Inhaler 6  . albuterol (PROVENTIL) (2.5 MG/3ML) 0.083% nebulizer solution Take 3 mLs (2.5 mg total) by nebulization every 6 (six) hours as needed for wheezing or shortness of breath. 150 mL 1  . amLODipine (NORVASC) 5 MG tablet Take 2 tablets (10 mg total) by mouth daily. 30 tablet 2  . aspirin 81 MG EC tablet TAKE 1 TABLET (81 MG TOTAL) BY MOUTH DAILY. 30 tablet 0  . atorvastatin (LIPITOR) 40 MG tablet Take 1 tablet (40 mg total) by mouth daily. 90 tablet 4  . Blood Glucose Monitoring Suppl (ACCU-CHEK AVIVA PLUS) w/Device KIT TEST FOUR TIMES DAILY 1 kit 0  . escitalopram (LEXAPRO) 20 MG tablet Take  1 tablet (20 mg total) by mouth daily. 90 tablet 0  . fluticasone furoate-vilanterol (BREO ELLIPTA) 100-25 MCG/INH AEPB Inhale 1 puff into the lungs daily. 84 each 0  . furosemide (LASIX) 40 MG tablet Take 1 tablet (40 mg total) by mouth daily. 90 tablet 1  . gabapentin (NEURONTIN) 600 MG tablet Take 1 tablet (600 mg total) by mouth 3 (three) times daily. 270 tablet 0  . glucose blood test strip Use to check blood sugar  3 times daily. 100 each 12  . lisinopril (PRINIVIL,ZESTRIL) 40 MG tablet Take 1 tablet (40 mg total) by mouth daily. 90 tablet 0  . loratadine (CLARITIN) 10 MG tablet Take 1 tablet (10 mg total) by mouth daily. 90 tablet 0  . metFORMIN (GLUCOPHAGE-XR) 500 MG 24 hr tablet Take 1 tablet (500 mg total) by mouth 2 (two) times daily. 180 tablet 0  . mirtazapine (REMERON) 30 MG tablet Take 1 tablet (30 mg total) by mouth at bedtime. 90 tablet 0  . montelukast (SINGULAIR) 10 MG tablet Take 1 tablet (10 mg total) by mouth at bedtime. 90 tablet 3  . Multiple Vitamins-Minerals (MULTIVITAMIN WITH MINERALS) tablet Take 1 tablet by mouth daily.    . naproxen (NAPROSYN) 500 MG tablet Take 1 tablet (500 mg total) by mouth 3 (three) times daily as needed. 270 tablet 0  . omeprazole (PRILOSEC) 40 MG capsule Take 1 capsule (40 mg total) by mouth daily. 90 capsule 1  . oxybutynin (DITROPAN) 5 MG tablet Take 1 tablet (5 mg total) by mouth 3 (three) times daily. 270 tablet 3  . potassium chloride (K-DUR,KLOR-CON) 10 MEQ tablet Take 2 tablets (20 mEq total) by mouth daily. 180 tablet 2  . tiotropium (SPIRIVA HANDIHALER) 18 MCG inhalation capsule Place 1 capsule (18 mcg total) into inhaler and inhale daily. 90 capsule 4  . verapamil (VERELAN PM) 240 MG 24 hr capsule Take 2 capsules (480 mg total) by mouth at bedtime. 180 capsule 0  . ziprasidone (GEODON) 80 MG capsule Take 1 capsule (80 mg total) by mouth 2 (two) times daily with a meal. 180 capsule 0   No current facility-administered medications for this visit.    Objective: Office vital signs reviewed. BP 144/80 mmHg  Pulse 86  Temp(Src) 98.6 F (37 C) (Oral)  Ht '5\' 4"'  (1.626 m)  Wt 257 lb (116.574 kg)  BMI 44.09 kg/m2  SpO2 90%   Physical Examination:  General: Awake, alert, well- nourished, NAD. Cardio: RRR, no m/r/g noted. No JVD Pulm:  Patient able to speak in full sentences, however stopping to take breaths in between sentences. Crackles noted over  the left lung diffusely. No wheezing or rhonchi noted. Satting 90% on room air. Extremities: 1+ pitting edema in the lower extremities bilaterally up to the knee.   Assessment/Plan: Tobacco abuse Patient used to smoke 1 pack per day. -I will contact her psychiatrist to see if Chantix would be a good option given her multiple psychiatric comorbidities.  COPD (chronic obstructive pulmonary disease) ([place]) Not controlled. Discussed importance of her using her oxygen all the time. We also discussed importance of smoking cessation. She will continue to use her current inhalers.  [name] noted to have some crackles in the left lung on my exam today. Unsure if this is her baseline since her previous hospitalization or if this is new. Given her slight increase in weight and her lower sternum a swelling will start her on Lasix 40 mg twice a day (instead of once  a day) and will get a chest x-ray. Urged the patient to follow-up with pulmonologist.    Orders Placed This Encounter  Procedures  . DG Chest 2 View    Standing Status: Future     Number of Occurrences:      Standing Expiration Date: 09/25/2016    Order Specific Question:  Reason for Exam (SYMPTOM  OR DIAGNOSIS REQUIRED)    Answer:  SOB, crackles in L lung diffusely, h/o COPD, pitting edema    Order Specific Question:  Is the patient pregnant?    Answer:  No    Order Specific Question:  Preferred imaging location?    Answer:  [facility]    Meds ordered this encounter  Medications  . naproxen (NAPROSYN) 500 MG tablet    Sig: Take 1 tablet (500 mg total) by mouth 3 (three) times daily as needed.    Dispense:  270 tablet    Refill:  0  . verapamil (VERELAN PM) 240 MG 24 hr capsule    Sig: Take 2 capsules (480 mg total) by mouth at bedtime.    Dispense:  180 capsule    Refill:  0  . tiotropium (SPIRIVA HANDIHALER) 18 MCG inhalation capsule    Sig: Place 1 capsule (18 mcg total) into inhaler and inhale daily.    Dispense:  90  capsule    Refill:  4  . potassium chloride (K-DUR,KLOR-CON) 10 MEQ tablet    Sig: Take 2 tablets (20 mEq total) by mouth daily.    Dispense:  180 tablet    Refill:  2  . montelukast (SINGULAIR) 10 MG tablet    Sig: Take 1 tablet (10 mg total) by mouth at bedtime.    Dispense:  90 tablet    Refill:  3  . metFORMIN (GLUCOPHAGE-XR) 500 MG 24 hr tablet    Sig: Take 1 tablet (500 mg total) by mouth 2 (two) times daily.    Dispense:  180 tablet    Refill:  0  . lisinopril (PRINIVIL,ZESTRIL) 40 MG tablet    Sig: Take 1 tablet (40 mg total) by mouth daily.    Dispense:  90 tablet    Refill:  0  . gabapentin (NEURONTIN) 600 MG tablet    Sig: Take 1 tablet (600 mg total) by mouth 3 (three) times daily.    Dispense:  270 tablet    Refill:  0  . furosemide (LASIX) 40 MG tablet    Sig: Take 1 tablet (40 mg total) by mouth daily.    Dispense:  90 tablet    Refill:  1  . atorvastatin (LIPITOR) 40 MG tablet    Sig: Take 1 tablet (40 mg total) by mouth daily.    Dispense:  90 tablet    Refill:  4  . albuterol (PROVENTIL HFA;VENTOLIN HFA) 108 (90 Base) MCG/ACT inhaler    Sig: Inhale 2 puffs into the lungs every 6 (six) hours as needed.    Dispense:  1 Inhaler    Refill:  6  . fluticasone furoate-vilanterol (BREO ELLIPTA) 100-25 MCG/INH AEPB    Sig: Inhale 1 puff into the lungs daily.    Dispense:  84 each    Refill:  0  . loratadine (CLARITIN) 10 MG tablet    Sig: Take 1 tablet (10 mg total) by mouth daily.    Dispense:  90 tablet    Refill:  [place] PGY-2, [place]

## 2015-07-26 NOTE — Patient Instructions (Signed)
You can go to the hospital and had a chest x-ray done today. I will call you today or tomorrow with the results, pending how long it takes the radiologist to read the report. Take Lasix 40 mg twice today and twice tomorrow. When we have a further read on chest x-ray, I will call you with further instructions. I prescribed Claritin to help with your allergies in addition to the Singulair I will contact you with information on what Dr. Marchia Bond says about the Chantix. You should use your oxygen all the time, even when out of the home.  Follow up with me in 1 month.   Chronic Obstructive Pulmonary Disease Chronic obstructive pulmonary disease (COPD) is a common lung condition in which airflow from the lungs is limited. COPD is a general term that can be used to describe many different lung problems that limit airflow, including both chronic bronchitis and emphysema. If you have COPD, your lung function will probably never return to normal, but there are measures you can take to improve lung function and make yourself feel better. CAUSES   Smoking (common).  Exposure to secondhand smoke.  Genetic problems.  Chronic inflammatory lung diseases or recurrent infections. SYMPTOMS  Shortness of breath, especially with physical activity.  Deep, persistent (chronic) cough with a large amount of thick mucus.  Wheezing.  Rapid breaths (tachypnea).  Gray or bluish discoloration (cyanosis) of the skin, especially in your fingers, toes, or lips.  Fatigue.  Weight loss.  Frequent infections or episodes when breathing symptoms become much worse (exacerbations).  Chest tightness. DIAGNOSIS Your health care provider will take a medical history and perform a physical examination to diagnose COPD. Additional tests for COPD may include:  Lung (pulmonary) function tests.  Chest X-ray.  CT scan.  Blood tests. TREATMENT  Treatment for COPD may include:  Inhaler and nebulizer medicines. These  help manage the symptoms of COPD and make your breathing more comfortable.  Supplemental oxygen. Supplemental oxygen is only helpful if you have a low oxygen level in your blood.  Exercise and physical activity. These are beneficial for nearly all people with COPD.  Lung surgery or transplant.  Nutrition therapy to gain weight, if you are underweight.  Pulmonary rehabilitation. This may involve working with a team of health care providers and specialists, such as respiratory, occupational, and physical therapists. HOME CARE INSTRUCTIONS  Take all medicines (inhaled or pills) as directed by your health care provider.  Avoid over-the-counter medicines or cough syrups that dry up your airway (such as antihistamines) and slow down the elimination of secretions unless instructed otherwise by your health care provider.  If you are a smoker, the most important thing that you can do is stop smoking. Continuing to smoke will cause further lung damage and breathing trouble. Ask your health care provider for help with quitting smoking. He or she can direct you to community resources or hospitals that provide support.  Avoid exposure to irritants such as smoke, chemicals, and fumes that aggravate your breathing.  Use oxygen therapy and pulmonary rehabilitation if directed by your health care provider. If you require home oxygen therapy, ask your health care provider whether you should purchase a pulse oximeter to measure your oxygen level at home.  Avoid contact with individuals who have a contagious illness.  Avoid extreme temperature and humidity changes.  Eat healthy foods. Eating smaller, more frequent meals and resting before meals may help you maintain your strength.  Stay active, but balance activity with periods  of rest. Exercise and physical activity will help you maintain your ability to do things you want to do.  Preventing infection and hospitalization is very important when you have  COPD. Make sure to receive all the vaccines your health care provider recommends, especially the pneumococcal and influenza vaccines. Ask your health care provider whether you need a pneumonia vaccine.  Learn and use relaxation techniques to manage stress.  Learn and use controlled breathing techniques as directed by your health care provider. Controlled breathing techniques include:  Pursed lip breathing. Start by breathing in (inhaling) through your nose for 1 second. Then, purse your lips as if you were going to whistle and breathe out (exhale) through the pursed lips for 2 seconds.  Diaphragmatic breathing. Start by putting one hand on your abdomen just above your waist. Inhale slowly through your nose. The hand on your abdomen should move out. Then purse your lips and exhale slowly. You should be able to feel the hand on your abdomen moving in as you exhale.  Learn and use controlled coughing to clear mucus from your lungs. Controlled coughing is a series of short, progressive coughs. The steps of controlled coughing are: 1. Lean your head slightly forward. 2. Breathe in deeply using diaphragmatic breathing. 3. Try to hold your breath for 3 seconds. 4. Keep your mouth slightly open while coughing twice. 5. Spit any mucus out into a tissue. 6. Rest and repeat the steps once or twice as needed. SEEK MEDICAL CARE IF:  You are coughing up more mucus than usual.  There is a change in the color or thickness of your mucus.  Your breathing is more labored than usual.  Your breathing is faster than usual. SEEK IMMEDIATE MEDICAL CARE IF:  You have shortness of breath while you are resting.  You have shortness of breath that prevents you from:  Being able to talk.  Performing your usual physical activities.  You have chest pain lasting longer than 5 minutes.  Your skin color is more cyanotic than usual.  You measure low oxygen saturations for longer than 5 minutes with a pulse  oximeter. MAKE SURE YOU:  Understand these instructions.  Will watch your condition.  Will get help right away if you are not doing well or get worse.   This information is not intended to replace advice given to you by your health care provider. Make sure you discuss any questions you have with your health care provider.   Document Released: 10/12/2004 Document Revised: 01/23/2014 Document Reviewed: 08/29/2012 Elsevier Interactive Patient Education [employer].

## 2015-07-26 NOTE — Assessment & Plan Note (Signed)
Patient used to smoke 1 pack per day. -I will contact her psychiatrist to see if Chantix would be a good option given her multiple psychiatric comorbidities.

## 2015-07-26 NOTE — Assessment & Plan Note (Signed)
Not controlled. Discussed importance of her using her oxygen all the time. We also discussed importance of smoking cessation. She will continue to use her current inhalers.  [name] noted to have some crackles in the left lung on my exam today. Unsure if this is her baseline since her previous hospitalization or if this is new. Given her slight increase in weight and her lower sternum a swelling will start her on Lasix 40 mg twice a day (instead of once a day) and will get a chest x-ray. Urged the patient to follow-up with pulmonologist.

## 2015-07-27 ENCOUNTER — Telehealth: Payer: Self-pay | Admitting: *Deleted

## 2015-07-27 ENCOUNTER — Other Ambulatory Visit: Payer: Self-pay | Admitting: Family Medicine

## 2015-07-27 NOTE — Telephone Encounter (Signed)
FYI: Patient called and states that she has requested a smaller portable oxygen tank and they will be sending over a fax for provider to fill out and sign.  Please contact patient with any questions.  Velva Molinari,CMA

## 2015-07-28 ENCOUNTER — Encounter: Payer: Self-pay | Admitting: *Deleted

## 2015-07-28 ENCOUNTER — Other Ambulatory Visit: Payer: Self-pay | Admitting: *Deleted

## 2015-07-28 NOTE — Patient Outreach (Signed)
[name]) Care Management   07/28/2015  Tiffany [name] 09-07-1960 [id number]  Tiffany [name] is an 55 y.o. female  Subjective:   Member reports that she is "doing all right."  She states that she is in a better place mentally since being able to move her and her son into their own home.  She confirms that she attended an appointment with her PCP on Monday, and that she was concerned with edema and lung sounds.  She state a chest xray was ordered but she has not obtained it yet.  She reports taking all medications as prescribed.  She state that she has found her CPAP machine since moving, however, since she is now on oxygen she will need a different machine that will be able to provide oxygen with the therapy.    Objective:   Review of Systems  Constitutional: Negative.   HENT: Negative.   Eyes: Negative.   Respiratory: Positive for cough and sputum production.   Cardiovascular: Positive for leg swelling.  Gastrointestinal: Negative.   Genitourinary: Negative.   Musculoskeletal: Positive for joint pain.  Skin: Negative.   Neurological: Negative.   Endo/Heme/Allergies: Negative.   Psychiatric/Behavioral: Positive for depression.    Physical Exam  Constitutional: [name] oriented to person, place, and time. She appears well-developed and well-nourished.  Neck: Normal range of motion.  Cardiovascular: Normal rate, regular rhythm and normal heart sounds.   Respiratory: Effort normal and breath sounds normal.  GI: Soft. Bowel sounds are normal.  Musculoskeletal: Normal range of motion.  Neurological: [name] alert and oriented to person, place, and time.  Skin: Skin is warm and dry.    BP 124/72 mmHg  Pulse 60  Ht 1.626 m (_0 )  Wt 257 lb (116.574 kg)  BMI 44.09 kg/m2  SpO2 96%   Encounter Medications:   Outpatient Encounter Prescriptions as of 07/28/2015  Medication Sig Note  . ACCU-CHEK SOFTCLIX LANCETS lancets  08/12/2014: Received from: External  Pharmacy  . albuterol (PROVENTIL HFA;VENTOLIN HFA) 108 (90 Base) MCG/ACT inhaler Inhale 2 puffs into the lungs every 6 (six) hours as needed.   [name] albuterol (PROVENTIL) (2.5 MG/3ML) 0.083% nebulizer solution Take 3 mLs (2.5 mg total) by nebulization every 6 (six) hours as needed for wheezing or shortness of breath.   [name] amLODipine (NORVASC) 5 MG tablet Take 2 tablets (10 mg total) by mouth daily.   [name] aspirin 81 MG EC tablet TAKE 1 TABLET (81 MG TOTAL) BY MOUTH DAILY.   [name] atorvastatin (LIPITOR) 40 MG tablet Take 1 tablet (40 mg total) by mouth daily.   . Blood Glucose Monitoring Suppl (ACCU-CHEK AVIVA PLUS) w/Device KIT TEST FOUR TIMES DAILY   . escitalopram (LEXAPRO) 20 MG tablet Take 1 tablet (20 mg total) by mouth daily.   . fluticasone furoate-vilanterol (BREO ELLIPTA) 100-25 MCG/INH AEPB Inhale 1 puff into the lungs daily.   [name] gabapentin (NEURONTIN) 600 MG tablet Take 1 tablet (600 mg total) by mouth 3 (three) times daily.   [name] glucose blood test strip Use to check blood sugar 3 times daily.   [name] lisinopril (PRINIVIL,ZESTRIL) 40 MG tablet Take 1 tablet (40 mg total) by mouth daily.   . metFORMIN (GLUCOPHAGE-XR) 500 MG 24 hr tablet Take 1 tablet (500 mg total) by mouth 2 (two) times daily.   . mirtazapine (REMERON) 30 MG tablet Take 1 tablet (30 mg total) by mouth at bedtime.   . montelukast (SINGULAIR) 10 MG tablet Take 1 tablet (10  mg total) by mouth at bedtime.   . naproxen (NAPROSYN) 500 MG tablet Take 1 tablet (500 mg total) by mouth 3 (three) times daily as needed.   [name] omeprazole (PRILOSEC) 40 MG capsule Take 1 capsule (40 mg total) by mouth daily.   [name] oxybutynin (DITROPAN) 5 MG tablet Take 1 tablet (5 mg total) by mouth 3 (three) times daily.   . potassium chloride (K-DUR,KLOR-CON) 10 MEQ tablet Take 2 tablets (20 mEq total) by mouth daily.   [name] tiotropium (SPIRIVA HANDIHALER) 18 MCG inhalation capsule Place 1 capsule (18 mcg total) into inhaler and inhale daily.   . verapamil (VERELAN PM)  240 MG 24 hr capsule Take 2 capsules (480 mg total) by mouth at bedtime.   . ziprasidone (GEODON) 80 MG capsule Take 1 capsule (80 mg total) by mouth 2 (two) times daily with a meal.   . furosemide (LASIX) 40 MG tablet Take 1 tablet (40 mg total) by mouth daily. (Patient not taking: Reported on 07/28/2015)   . loratadine (CLARITIN) 10 MG tablet Take 1 tablet (10 mg total) by mouth daily. (Patient not taking: Reported on 07/28/2015) 07/28/2015: Have not received from pharmacy yet  . Multiple Vitamins-Minerals (MULTIVITAMIN WITH MINERALS) tablet Take 1 tablet by mouth daily. Reported on 07/28/2015    No facility-administered encounter medications on file as of 07/28/2015.    Functional Status:   In your present state of health, do you have any difficulty performing the following activities: 07/16/2015 07/09/2015  Hearing? N N  Vision? N N  Difficulty concentrating or making decisions? N N  Walking or climbing stairs? Y N  Dressing or bathing? N N  Doing errands, shopping? Y -  Conservation officer, nature and eating ? N -  Using the Toilet? N -  In the past six months, have you accidently leaked urine? N -  Do you have problems with loss of bowel control? N -  Managing your Medications? N -  Managing your Finances? N -  Housekeeping or managing your Housekeeping? N -    Fall/Depression Screening:    PHQ 2/9 Scores 07/28/2015 06/01/2015 05/14/2015 01/20/2015 12/17/2014 12/07/2014 10/20/2014  PHQ - 2 Score 0 0 1 0 3 0 0  PHQ- 9 Score - - - - - - -    Assessment:    Met with member at scheduled time.  [name] wearing her home O2,has portable tanks and compressor in living room.  She state she has requested a smaller portable tank that is easier to carry as she now has the tank/roller.  [name] waiting for a new prescription to be sent from her PCP to [place].  Discussed with member the importance of using CPAP machine now that it is obtained, verbalizes understanding of importance of contacting for new  machine.  She state she will contact them as soon as possible.  Member denies contacting pulmonology office to reschedule appointment, stating that she still do not have the money for a copay.  [name] aware that [facility] is unable to pay for copays, but offered to have social worker involvement to assist with other finances to provide enough money for copay.  She state that this still would be difficult and would take a "while" to save up money for the copay.  Discussed PCP's recommendations of increased Lasix dose and order for chest xray.  [name] advised to obtain xray as soon as possible, state she will have it done by Friday.  Medications reviewed, member  does not have any Lasix available, states she ran out a couple days ago and state she will be getting a refill from [facility] mail order within the next 5-10 days.  Discussed the importance again of obtaining chest xray as it will aide PCP with her management, she verbalizes understanding.    Member state she is using a pill box for management, but state that it is "a lot to deal with every week."  She state she is also having to fill her son's pill box as well.  Discussed other options, such as prepackaged medications and having someone from the [facility] program ([place]) to assist her, but advised that it will be an additional cost.  [name] interested in learning more about the prepackaged meds and will contact her pharmacy.  Call placed to D. Pettus, [facility] pharmacist, requesting assistance with Lasix.  She request to have the order clarified (54m daily vs twice a day) and to have new prescription sent to outpatient pharmacy for member to obtain [facility]   Member denies any further concerns at this time, encouraged to contact this care manager with questions.   Plan:   Will contact PCP office regarding Lasix dosing. Will follow up next month.  THN CM Care Plan Problem One        Most Recent  Value   Care Plan Problem One  Risk for hospital readmission related to symptom management of COPD as evidenced by inadequate follow-through of instructions   Role Documenting the Problem One  Care Management [place] Problem [place]  Active   THN Long Term Goal (31-90 days)  Member will have functioning CPAP machine within the next 31 days   THN Long Term Goal Start Date  07/28/15   [facility] Met Date  07/28/15   Interventions for Problem One Long Term Goal  Instructed member to [place] to have new machine ordered since she is now on oxygen   THN CM Short Term Goal #1 (0-30 days)  Member will have chest xray completed within the next week   THN CM Short Term Goal #1 Start Date  07/28/15   [facility] Short Term Goal #1 Met Date  07/28/15   Interventions for Short Term Goal #1  Explained to member through discussion the importance of obtaining chest xray and its relation to managing symptoms.   THN CM Short Term Goal #2 (0-30 days)  Member will report discussing smoking cessation techniques with psych physician within the next 4 weeks   THN CM Short Term Goal #2 Start Date  07/28/15   [facility] Goal #2 Met Date  07/28/15   Interventions for Short Term Goal #2  Discussed the importance of smoking cessation and management of COPD, advised to discuss with psych physician      MValente David RN, MSN TShade GapManager [phone]

## 2015-07-28 NOTE — Telephone Encounter (Signed)
I have not received a fax as of yet. Will fill out the form when I receive it, however I am unsure if they will approve it as she already has a portable oxygen tank (and she requires a lot of oxygen at baseline).  Archie Patten, MD [facility] Family Medicine Resident  07/28/2015, 5:03 PM

## 2015-07-29 ENCOUNTER — Other Ambulatory Visit: Payer: Self-pay | Admitting: *Deleted

## 2015-07-29 ENCOUNTER — Ambulatory Visit (HOSPITAL_COMMUNITY): Admission: RE | Admit: 2015-07-29 | Payer: Commercial Managed Care - HMO | Source: Home / Self Care

## 2015-07-29 ENCOUNTER — Telehealth: Payer: Self-pay | Admitting: Family Medicine

## 2015-07-29 ENCOUNTER — Ambulatory Visit (HOSPITAL_COMMUNITY): Payer: Commercial Managed Care - HMO

## 2015-07-29 ENCOUNTER — Emergency Department (HOSPITAL_COMMUNITY): Payer: Commercial Managed Care - HMO

## 2015-07-29 ENCOUNTER — Ambulatory Visit (HOSPITAL_COMMUNITY)
Admission: RE | Admit: 2015-07-29 | Discharge: 2015-07-29 | Disposition: A | Discharge status: Home / Self Care | Payer: Commercial Managed Care - HMO | Source: Ambulatory Visit | Attending: Family Medicine | Admitting: Family Medicine

## 2015-07-29 DIAGNOSIS — J440 Chronic obstructive pulmonary disease with acute lower respiratory infection: Secondary | ICD-10-CM | POA: Diagnosis not present

## 2015-07-29 DIAGNOSIS — R0602 Shortness of breath: Secondary | ICD-10-CM | POA: Diagnosis not present

## 2015-07-29 DIAGNOSIS — J449 Chronic obstructive pulmonary disease, unspecified: Secondary | ICD-10-CM | POA: Diagnosis present

## 2015-07-29 DIAGNOSIS — R05 Cough: Secondary | ICD-10-CM | POA: Diagnosis not present

## 2015-07-29 DIAGNOSIS — F329 Major depressive disorder, single episode, unspecified: Secondary | ICD-10-CM | POA: Diagnosis not present

## 2015-07-29 DIAGNOSIS — J189 Pneumonia, unspecified organism: Secondary | ICD-10-CM | POA: Diagnosis not present

## 2015-07-29 DIAGNOSIS — I110 Hypertensive heart disease with heart failure: Secondary | ICD-10-CM | POA: Diagnosis not present

## 2015-07-29 DIAGNOSIS — E1142 Type 2 diabetes mellitus with diabetic polyneuropathy: Secondary | ICD-10-CM | POA: Diagnosis not present

## 2015-07-29 DIAGNOSIS — F419 Anxiety disorder, unspecified: Secondary | ICD-10-CM | POA: Diagnosis not present

## 2015-07-29 DIAGNOSIS — F319 Bipolar disorder, unspecified: Secondary | ICD-10-CM | POA: Diagnosis not present

## 2015-07-29 DIAGNOSIS — I5032 Chronic diastolic (congestive) heart failure: Secondary | ICD-10-CM | POA: Diagnosis not present

## 2015-07-29 DIAGNOSIS — J45909 Unspecified asthma, uncomplicated: Secondary | ICD-10-CM | POA: Diagnosis not present

## 2015-07-29 MED ORDER — FUROSEMIDE 40 MG PO TABS: 40.0000 mg | ORAL_TABLET | Freq: Every day | ORAL | Status: DC

## 2015-07-29 NOTE — Patient Outreach (Signed)
Call placed earlier in the day to PCP office to request clarification of Lasix dosing and to request new prescription be sent to outpatient pharmacy to allow [facility] to assist with obtaining.  Call received back from Dr. Lorenso Courier to clarify that member was to take 40 mg of Lasix twice a day on Monday and Tuesday.  [name] made aware that no Lasix was found upon medication review yesterday and that the member reported that she had been out for a "couple days."  [name] also made aware that member has not had chest xray done.  Dr. Lorenso Courier advised that she will place another prescription to the outpatient pharmacy for Lasix in the interim of member obtaining it from [facility].  Member is to take 40mg  twice a day for 4 days then go back to 40 mg daily.    This care manager will inform [facility] pharmacist, D. Pettus, of order and have member obtain from pharmacy.  [name], [place], MSN [place] [phone]

## 2015-07-29 NOTE — ED Notes (Signed)
Pt states that she was sent here by her PCP for a chest xray. States she was recently discharged from the hospital with pneumonia. States she followed up with her PCP today and her PCP heard fluid in her lungs and wanted her to get a chest xray.

## 2015-07-29 NOTE — Telephone Encounter (Signed)
Dr. Sherri Rad monitor. The patient apparently has not taken her Lasix and several days. She had not informed me that she was completely out of Lasix.  The Lasix prescription was ordered on our last visit 3 days ago, but apparently will not be there for the next 7 days.  Additionally, the patient still has not gone to get the chest x-ray that I ordered.    I explained to monitor, but the increase in Lasix to twice a day was temporary until we could get the excess fluid off. I sent in a prescription to [place] that [facility] will cover for this month. I wrote that she should take this medication twice daily from 7/14-7/18 and then daily after that.   I also noted that the patient has not followed up with pulmonology as she cannot afford the $40 co-pay. THNs unable to pay for co-pays. Monica offered to help pay for other things so that Ms. Giannini could afford her co-pay, but she stated it would still be a long time until she could go due to funding.   I will forward this to our social worker to see if we can be of any assistance.  Archie Patten, MD [facility] Family Medicine Resident  07/29/2015, 5:13 PM

## 2015-07-29 NOTE — Telephone Encounter (Signed)
Monica called [facility] after a visit with the patient. The patient didn't have any of her Lasix. She does use mail order and it will be up to 10 days more before she receives this. The doctor has increased her Lasix to twice a day at 40 MG. Monica wanted to know if this was a permanent change to her lasix. They would like to purchase some Lasix for the patient to hold her over until her mail order gets here. If this is a permanent change to her medication they will need a new order for the patient. Please call Brayton Layman to discuss at [phone]. Blima Rich

## 2015-07-30 ENCOUNTER — Other Ambulatory Visit: Payer: Self-pay | Admitting: *Deleted

## 2015-07-30 ENCOUNTER — Telehealth: Payer: Self-pay | Admitting: Family Medicine

## 2015-07-30 MED FILL — FUROSEMIDE 40 MG TABLET: 40 | 25 days supply | Qty: 30 | Fill #0

## 2015-07-30 NOTE — Telephone Encounter (Signed)
Called to inform the patient of her CXR. No new pulmonary edema or focal consolidation. Stated antibiotics not required. B/c she appeared volume overloaded on my exam on 7/10, she will take Lasix BID 7/14-7/18 then back to daily. This is written on her Rx that went to [place]. Monica with [facility] to discuss with their pharmacy that [facility] will pay for it.  Archie Patten, MD [facility] Family Medicine Resident  07/30/2015, 10:28 AM

## 2015-07-30 NOTE — Patient Outreach (Signed)
Call placed to member to inform her that a new prescription for Lasix has been sent to [place], and the cost has been covered by [facility].  [name] made aware that she should take 40 mg twice a day for the next 4 days and then return to taking her daily dose.  She verbalizes understanding, denies questions.  Will follow up within the next 2 weeks.  [name], [place], MSN [place] [phone]

## 2015-08-02 ENCOUNTER — Other Ambulatory Visit (HOSPITAL_COMMUNITY): Payer: Self-pay | Admitting: Psychiatry

## 2015-08-03 ENCOUNTER — Ambulatory Visit (HOSPITAL_COMMUNITY): Payer: Self-pay | Admitting: Psychiatry

## 2015-08-03 ENCOUNTER — Telehealth: Payer: Self-pay | Admitting: Licensed Clinical Social Worker

## 2015-08-03 NOTE — Telephone Encounter (Signed)
[name] consult from Dr. Lorenso Courier ref: patient needs to go to Pulmonology, however unable to afford $40.00 co-pay.  Call to patient reference follow up from Dr. Lorenso Courier. Patient states she has no way to pay $45.00 co-pay so she has not been to her appointment. [name] discussed various options, patient has no resources.   Plan: Patient will schedule her Pulmonology appointment and call [name] back with location, date and time.  [name] will assist patient out of indigent funds with co-pay when she calls back with information.  Casimer Lanius, LCSW Licensed Clinical Social Worker [facility]   [phone] 3:18 PM

## 2015-08-04 ENCOUNTER — Telehealth: Payer: Self-pay | Admitting: Licensed Clinical Social Worker

## 2015-08-04 NOTE — Telephone Encounter (Signed)
[name] received a call from patient.  She has scheduled her appointment with [facility] Pulmonology Aug. 21st at 3:00.  [name] informed patient, one time funds were donated to assist her with the co-pay. Call to [facility], spoke to Ms. Stanley.  Confirmed patient's co-pay is $45.00.  Plan: Patient is in agreement to call [name] one week prior to her appointment to arrange a time to pick up a $45 money order payable to [facility].   Casimer Lanius, LCSW Licensed Clinical Social Worker [facility]   [phone] 12:05 PM

## 2015-08-10 ENCOUNTER — Other Ambulatory Visit (HOSPITAL_COMMUNITY): Payer: Self-pay | Admitting: Psychiatry

## 2015-08-10 DIAGNOSIS — J45909 Unspecified asthma, uncomplicated: Secondary | ICD-10-CM | POA: Diagnosis not present

## 2015-08-10 DIAGNOSIS — I5032 Chronic diastolic (congestive) heart failure: Secondary | ICD-10-CM | POA: Diagnosis not present

## 2015-08-10 DIAGNOSIS — F319 Bipolar disorder, unspecified: Secondary | ICD-10-CM | POA: Diagnosis not present

## 2015-08-10 DIAGNOSIS — J440 Chronic obstructive pulmonary disease with acute lower respiratory infection: Secondary | ICD-10-CM | POA: Diagnosis not present

## 2015-08-10 DIAGNOSIS — J189 Pneumonia, unspecified organism: Secondary | ICD-10-CM | POA: Diagnosis not present

## 2015-08-10 DIAGNOSIS — I110 Hypertensive heart disease with heart failure: Secondary | ICD-10-CM | POA: Diagnosis not present

## 2015-08-10 DIAGNOSIS — F329 Major depressive disorder, single episode, unspecified: Secondary | ICD-10-CM | POA: Diagnosis not present

## 2015-08-10 DIAGNOSIS — F419 Anxiety disorder, unspecified: Secondary | ICD-10-CM | POA: Diagnosis not present

## 2015-08-10 DIAGNOSIS — E1142 Type 2 diabetes mellitus with diabetic polyneuropathy: Secondary | ICD-10-CM | POA: Diagnosis not present

## 2015-08-19 ENCOUNTER — Emergency Department (HOSPITAL_COMMUNITY): Payer: Commercial Managed Care - HMO

## 2015-08-19 ENCOUNTER — Encounter (HOSPITAL_COMMUNITY): Payer: Self-pay | Admitting: *Deleted

## 2015-08-19 ENCOUNTER — Inpatient Hospital Stay (HOSPITAL_COMMUNITY)
Admission: EM | Admit: 2015-08-19 | Discharge: 2015-08-23 | DRG: 190 | Disposition: A | Discharge status: Home / Self Care | Payer: Commercial Managed Care - HMO | Attending: Family Medicine | Admitting: Family Medicine

## 2015-08-19 DIAGNOSIS — I5030 Unspecified diastolic (congestive) heart failure: Secondary | ICD-10-CM | POA: Diagnosis not present

## 2015-08-19 DIAGNOSIS — J9601 Acute respiratory failure with hypoxia: Secondary | ICD-10-CM

## 2015-08-19 DIAGNOSIS — J189 Pneumonia, unspecified organism: Secondary | ICD-10-CM | POA: Diagnosis present

## 2015-08-19 DIAGNOSIS — J441 Chronic obstructive pulmonary disease with (acute) exacerbation: Secondary | ICD-10-CM | POA: Diagnosis present

## 2015-08-19 DIAGNOSIS — G4733 Obstructive sleep apnea (adult) (pediatric): Secondary | ICD-10-CM | POA: Diagnosis present

## 2015-08-19 DIAGNOSIS — R0600 Dyspnea, unspecified: Secondary | ICD-10-CM | POA: Diagnosis not present

## 2015-08-19 DIAGNOSIS — J440 Chronic obstructive pulmonary disease with acute lower respiratory infection: Secondary | ICD-10-CM | POA: Diagnosis not present

## 2015-08-19 DIAGNOSIS — I110 Hypertensive heart disease with heart failure: Secondary | ICD-10-CM | POA: Diagnosis present

## 2015-08-19 DIAGNOSIS — Z7982 Long term (current) use of aspirin: Secondary | ICD-10-CM

## 2015-08-19 DIAGNOSIS — F17210 Nicotine dependence, cigarettes, uncomplicated: Secondary | ICD-10-CM | POA: Diagnosis present

## 2015-08-19 DIAGNOSIS — R0602 Shortness of breath: Secondary | ICD-10-CM

## 2015-08-19 DIAGNOSIS — Z9981 Dependence on supplemental oxygen: Secondary | ICD-10-CM

## 2015-08-19 DIAGNOSIS — I2510 Atherosclerotic heart disease of native coronary artery without angina pectoris: Secondary | ICD-10-CM | POA: Diagnosis not present

## 2015-08-19 DIAGNOSIS — J9622 Acute and chronic respiratory failure with hypercapnia: Secondary | ICD-10-CM

## 2015-08-19 DIAGNOSIS — F319 Bipolar disorder, unspecified: Secondary | ICD-10-CM | POA: Diagnosis present

## 2015-08-19 DIAGNOSIS — I248 Other forms of acute ischemic heart disease: Secondary | ICD-10-CM

## 2015-08-19 DIAGNOSIS — Z818 Family history of other mental and behavioral disorders: Secondary | ICD-10-CM | POA: Diagnosis not present

## 2015-08-19 DIAGNOSIS — Z6841 Body Mass Index (BMI) 40.0 and over, adult: Secondary | ICD-10-CM | POA: Diagnosis not present

## 2015-08-19 DIAGNOSIS — Z8673 Personal history of transient ischemic attack (TIA), and cerebral infarction without residual deficits: Secondary | ICD-10-CM

## 2015-08-19 DIAGNOSIS — Z79899 Other long term (current) drug therapy: Secondary | ICD-10-CM

## 2015-08-19 DIAGNOSIS — J9602 Acute respiratory failure with hypercapnia: Secondary | ICD-10-CM

## 2015-08-19 DIAGNOSIS — I252 Old myocardial infarction: Secondary | ICD-10-CM | POA: Diagnosis not present

## 2015-08-19 DIAGNOSIS — J80 Acute respiratory distress syndrome: Secondary | ICD-10-CM | POA: Diagnosis not present

## 2015-08-19 DIAGNOSIS — J181 Lobar pneumonia, unspecified organism: Secondary | ICD-10-CM | POA: Diagnosis not present

## 2015-08-19 DIAGNOSIS — E785 Hyperlipidemia, unspecified: Secondary | ICD-10-CM | POA: Diagnosis not present

## 2015-08-19 DIAGNOSIS — Z720 Tobacco use: Secondary | ICD-10-CM

## 2015-08-19 DIAGNOSIS — R0902 Hypoxemia: Secondary | ICD-10-CM

## 2015-08-19 DIAGNOSIS — R609 Edema, unspecified: Secondary | ICD-10-CM | POA: Diagnosis not present

## 2015-08-19 DIAGNOSIS — R042 Hemoptysis: Secondary | ICD-10-CM

## 2015-08-19 DIAGNOSIS — Z881 Allergy status to other antibiotic agents status: Secondary | ICD-10-CM | POA: Diagnosis not present

## 2015-08-19 DIAGNOSIS — I5032 Chronic diastolic (congestive) heart failure: Secondary | ICD-10-CM

## 2015-08-19 DIAGNOSIS — J9621 Acute and chronic respiratory failure with hypoxia: Secondary | ICD-10-CM

## 2015-08-19 DIAGNOSIS — E1140 Type 2 diabetes mellitus with diabetic neuropathy, unspecified: Secondary | ICD-10-CM | POA: Diagnosis not present

## 2015-08-19 DIAGNOSIS — R7989 Other specified abnormal findings of blood chemistry: Secondary | ICD-10-CM | POA: Diagnosis not present

## 2015-08-19 DIAGNOSIS — Z9119 Patient's noncompliance with other medical treatment and regimen: Secondary | ICD-10-CM

## 2015-08-19 DIAGNOSIS — Z7984 Long term (current) use of oral hypoglycemic drugs: Secondary | ICD-10-CM

## 2015-08-19 DIAGNOSIS — I5033 Acute on chronic diastolic (congestive) heart failure: Secondary | ICD-10-CM | POA: Diagnosis not present

## 2015-08-19 DIAGNOSIS — Z885 Allergy status to narcotic agent status: Secondary | ICD-10-CM

## 2015-08-19 DIAGNOSIS — I509 Heart failure, unspecified: Secondary | ICD-10-CM | POA: Diagnosis not present

## 2015-08-19 DIAGNOSIS — Z888 Allergy status to other drugs, medicaments and biological substances status: Secondary | ICD-10-CM

## 2015-08-19 HISTORY — DX: Essential (primary) hypertension: I10

## 2015-08-19 LAB — I-STAT ARTERIAL BLOOD GAS, ED
Acid-Base Excess: 2 mmol/L (ref 0.0–2.0)
Acid-Base Excess: 6 mmol/L — ABNORMAL HIGH (ref 0.0–2.0)
Bicarbonate: 31.1 mEq/L — ABNORMAL HIGH (ref 20.0–24.0)
Bicarbonate: 33.7 mEq/L — ABNORMAL HIGH (ref 20.0–24.0)
O2 Saturation: 76 %
O2 Saturation: 91 %
Patient temperature: 98.6
Patient temperature: 97.7
TCO2: 33 mmol/L (ref 0–100)
TCO2: 36 mmol/L (ref 0–100)
pCO2 arterial: 71.5 mmHg (ref 35.0–45.0)
pCO2 arterial: 65.4 mmHg (ref 35.0–45.0)
pH, Arterial: 7.246 — ABNORMAL LOW (ref 7.350–7.450)
pH, Arterial: 7.317 — ABNORMAL LOW (ref 7.350–7.450)
pO2, Arterial: 49 mmHg — ABNORMAL LOW (ref 80.0–100.0)
pO2, Arterial: 67 mmHg — ABNORMAL LOW (ref 80.0–100.0)

## 2015-08-19 LAB — BASIC METABOLIC PANEL
Anion gap: 12 (ref 5–15)
BUN: 10 mg/dL (ref 6–20)
CO2: 28 mmol/L (ref 22–32)
Calcium: 9.4 mg/dL (ref 8.9–10.3)
Chloride: 102 mmol/L (ref 101–111)
Creatinine, Ser: 1.18 mg/dL — ABNORMAL HIGH (ref 0.44–1.00)
GFR calc Af Amer: 59 mL/min — ABNORMAL LOW (ref >60)
GFR calc non Af Amer: 51 mL/min — ABNORMAL LOW (ref >60)
Glucose, Bld: 122 mg/dL — ABNORMAL HIGH (ref 65–99)
Potassium: 4.3 mmol/L (ref 3.5–5.1)
Sodium: 142 mmol/L (ref 135–145)

## 2015-08-19 LAB — CBC
HCT: 36.2 % (ref 36.0–46.0)
Hemoglobin: 10.6 g/dL — ABNORMAL LOW (ref 12.0–15.0)
MCH: 28 pg (ref 26.0–34.0)
MCHC: 29.3 g/dL — ABNORMAL LOW (ref 30.0–36.0)
MCV: 95.8 fL (ref 78.0–100.0)
Platelets: 493 10*3/uL — ABNORMAL HIGH (ref 150–400)
RBC: 3.78 MIL/uL — ABNORMAL LOW (ref 3.87–5.11)
RDW: 16.7 % — ABNORMAL HIGH (ref 11.5–15.5)
WBC: 15.6 10*3/uL — ABNORMAL HIGH (ref 4.0–10.5)

## 2015-08-19 LAB — I-STAT TROPONIN, ED: Troponin i, poc: 0.11 ng/mL (ref 0.00–0.08)

## 2015-08-19 LAB — GLUCOSE, CAPILLARY: Glucose-Capillary: 101 mg/dL — ABNORMAL HIGH (ref 65–99)

## 2015-08-19 LAB — BRAIN NATRIURETIC PEPTIDE: B Natriuretic Peptide: 180 pg/mL — ABNORMAL HIGH (ref 0.0–100.0)

## 2015-08-19 LAB — TROPONIN I: Troponin I: <0.03 ng/mL (ref <0.03)

## 2015-08-19 MED ORDER — ASPIRIN EC 81 MG PO TBEC
81.0000 mg | DELAYED_RELEASE_TABLET | Freq: Every day | ORAL | Status: DC
  Administered 2015-08-20 – 2015-08-23 (×4): 81 mg via ORAL
  Filled 2015-08-19 (×4): qty 1

## 2015-08-19 MED ORDER — IPRATROPIUM-ALBUTEROL 0.5-2.5 (3) MG/3ML IN SOLN: 3.0000 mL | Freq: Four times a day (QID) | RESPIRATORY_TRACT | Status: DC

## 2015-08-19 MED ORDER — IPRATROPIUM-ALBUTEROL 0.5-2.5 (3) MG/3ML IN SOLN
3.0000 mL | Freq: Four times a day (QID) | RESPIRATORY_TRACT | Status: DC
  Administered 2015-08-19 – 2015-08-23 (×15): 3 mL via RESPIRATORY_TRACT
  Filled 2015-08-19 (×15): qty 3

## 2015-08-19 MED ORDER — MOMETASONE FURO-FORMOTEROL FUM 200-5 MCG/ACT IN AERO
2.0000 | INHALATION_SPRAY | Freq: Two times a day (BID) | RESPIRATORY_TRACT | Status: DC
  Filled 2015-08-19: qty 8.8

## 2015-08-19 MED ORDER — MONTELUKAST SODIUM 10 MG PO TABS
10.0000 mg | ORAL_TABLET | Freq: Every day | ORAL | Status: DC
  Administered 2015-08-20 – 2015-08-22 (×3): 10 mg via ORAL
  Filled 2015-08-19 (×3): qty 1

## 2015-08-19 MED ORDER — METHYLPREDNISOLONE SODIUM SUCC 125 MG IJ SOLR
60.0000 mg | Freq: Two times a day (BID) | INTRAMUSCULAR | Status: DC
  Administered 2015-08-20 – 2015-08-21 (×3): 60 mg via INTRAVENOUS
  Filled 2015-08-19 (×3): qty 2

## 2015-08-19 MED ORDER — SODIUM CHLORIDE 0.9 % IV SOLN: 250.0000 mL | INTRAVENOUS | Status: DC | PRN

## 2015-08-19 MED ORDER — SODIUM CHLORIDE 0.9% FLUSH
3.0000 mL | Freq: Two times a day (BID) | INTRAVENOUS | Status: DC
  Administered 2015-08-19 – 2015-08-23 (×8): 3 mL via INTRAVENOUS

## 2015-08-19 MED ORDER — MOMETASONE FURO-FORMOTEROL FUM 200-5 MCG/ACT IN AERO
2.0000 | INHALATION_SPRAY | Freq: Two times a day (BID) | RESPIRATORY_TRACT | Status: DC
  Administered 2015-08-20 – 2015-08-23 (×5): 2 via RESPIRATORY_TRACT
  Filled 2015-08-19: qty 8.8

## 2015-08-19 MED ORDER — ASPIRIN 81 MG PO TBEC: 81.0000 mg | DELAYED_RELEASE_TABLET | Freq: Every day | ORAL | Status: DC

## 2015-08-19 MED ORDER — IPRATROPIUM-ALBUTEROL 0.5-2.5 (3) MG/3ML IN SOLN
RESPIRATORY_TRACT | Status: AC
  Administered 2015-08-19: 3 mL via RESPIRATORY_TRACT
  Filled 2015-08-19: qty 3

## 2015-08-19 MED ORDER — INSULIN ASPART 100 UNIT/ML ~~LOC~~ SOLN
0.0000 [IU] | Freq: Three times a day (TID) | SUBCUTANEOUS | Status: DC
  Administered 2015-08-20 (×2): 2 [IU] via SUBCUTANEOUS
  Administered 2015-08-20: 3 [IU] via SUBCUTANEOUS
  Administered 2015-08-21 – 2015-08-22 (×3): 2 [IU] via SUBCUTANEOUS

## 2015-08-19 MED ORDER — SODIUM CHLORIDE 0.9% FLUSH
3.0000 mL | Freq: Two times a day (BID) | INTRAVENOUS | Status: DC
  Administered 2015-08-19 – 2015-08-23 (×7): 3 mL via INTRAVENOUS

## 2015-08-19 MED ORDER — ALBUTEROL SULFATE (2.5 MG/3ML) 0.083% IN NEBU: 2.5000 mg | INHALATION_SOLUTION | RESPIRATORY_TRACT | Status: DC | PRN

## 2015-08-19 MED ORDER — TIOTROPIUM BROMIDE MONOHYDRATE 18 MCG IN CAPS
18.0000 ug | ORAL_CAPSULE | Freq: Every day | RESPIRATORY_TRACT | Status: DC
  Administered 2015-08-21 – 2015-08-22 (×2): 18 ug via RESPIRATORY_TRACT
  Filled 2015-08-19 (×2): qty 5

## 2015-08-19 MED ORDER — ESCITALOPRAM OXALATE 20 MG PO TABS
20.0000 mg | ORAL_TABLET | Freq: Every day | ORAL | Status: DC
  Administered 2015-08-20 – 2015-08-23 (×4): 20 mg via ORAL
  Filled 2015-08-19: qty 1
  Filled 2015-08-19 (×2): qty 2
  Filled 2015-08-19: qty 1

## 2015-08-19 MED ORDER — GABAPENTIN 600 MG PO TABS
600.0000 mg | ORAL_TABLET | Freq: Three times a day (TID) | ORAL | Status: DC
  Administered 2015-08-20 – 2015-08-23 (×10): 600 mg via ORAL
  Filled 2015-08-19 (×10): qty 1

## 2015-08-19 MED ORDER — FUROSEMIDE 10 MG/ML IJ SOLN
20.0000 mg | Freq: Once | INTRAMUSCULAR | Status: AC
  Administered 2015-08-19: 20 mg via INTRAVENOUS

## 2015-08-19 MED ORDER — SODIUM CHLORIDE 0.9% FLUSH: 3.0000 mL | INTRAVENOUS | Status: DC | PRN

## 2015-08-19 MED ORDER — FUROSEMIDE 10 MG/ML IJ SOLN
20.0000 mg | Freq: Once | INTRAMUSCULAR | Status: AC
  Administered 2015-08-19: 20 mg via INTRAVENOUS
  Filled 2015-08-19: qty 2

## 2015-08-19 MED ORDER — NICOTINE 21 MG/24HR TD PT24
21.0000 mg | MEDICATED_PATCH | Freq: Every day | TRANSDERMAL | Status: DC
Start: 2015-08-20 — End: 2015-08-23
  Administered 2015-08-20 – 2015-08-23 (×4): 21 mg via TRANSDERMAL
  Filled 2015-08-19 (×4): qty 1

## 2015-08-19 MED ORDER — METHYLPREDNISOLONE SODIUM SUCC 125 MG IJ SOLR
80.0000 mg | Freq: Once | INTRAMUSCULAR | Status: AC
  Administered 2015-08-20: 80 mg via INTRAVENOUS
  Filled 2015-08-19: qty 2

## 2015-08-19 MED ORDER — FUROSEMIDE 10 MG/ML IJ SOLN
40.0000 mg | Freq: Two times a day (BID) | INTRAMUSCULAR | Status: DC
  Administered 2015-08-20 – 2015-08-21 (×3): 40 mg via INTRAVENOUS
  Filled 2015-08-19 (×3): qty 4

## 2015-08-19 MED ORDER — ZIPRASIDONE HCL 80 MG PO CAPS
160.0000 mg | ORAL_CAPSULE | Freq: Every day | ORAL | Status: DC
  Administered 2015-08-20 – 2015-08-22 (×3): 160 mg via ORAL
  Filled 2015-08-19: qty 2
  Filled 2015-08-19: qty 8
  Filled 2015-08-19: qty 2

## 2015-08-19 MED ORDER — ATORVASTATIN CALCIUM 40 MG PO TABS
40.0000 mg | ORAL_TABLET | Freq: Every day | ORAL | Status: DC
  Administered 2015-08-20 – 2015-08-22 (×3): 40 mg via ORAL
  Filled 2015-08-19 (×3): qty 1

## 2015-08-19 MED ORDER — PANTOPRAZOLE SODIUM 40 MG PO TBEC
40.0000 mg | DELAYED_RELEASE_TABLET | Freq: Every day | ORAL | Status: DC
  Administered 2015-08-20 – 2015-08-23 (×4): 40 mg via ORAL
  Filled 2015-08-19 (×4): qty 1

## 2015-08-19 MED ORDER — HEPARIN SODIUM (PORCINE) 5000 UNIT/ML IJ SOLN
5000.0000 [IU] | Freq: Three times a day (TID) | INTRAMUSCULAR | Status: DC
  Administered 2015-08-20 – 2015-08-23 (×11): 5000 [IU] via SUBCUTANEOUS
  Filled 2015-08-19 (×11): qty 1

## 2015-08-19 MED ORDER — DOXYCYCLINE HYCLATE 100 MG PO TABS
100.0000 mg | ORAL_TABLET | Freq: Two times a day (BID) | ORAL | Status: DC
  Administered 2015-08-20 – 2015-08-23 (×7): 100 mg via ORAL
  Filled 2015-08-19 (×7): qty 1

## 2015-08-19 MED ORDER — INSULIN ASPART 100 UNIT/ML ~~LOC~~ SOLN: 0.0000 [IU] | Freq: Every day | SUBCUTANEOUS | Status: DC

## 2015-08-19 NOTE — ED Provider Notes (Signed)
[facility] DEPT Provider Note   CSN: [id number] Arrival date & time: 08/19/15  1444  First Provider Contact:  None       History   Chief Complaint Chief Complaint  Patient presents with  . Shortness of Breath    HPI Tiffany [name] is a 55 y.o. female.  The history is provided by the patient and a relative. No language interpreter was used.  Shortness of Breath  This is a new problem. The average episode lasts 1 day. The problem occurs intermittently.The current episode started yesterday. The problem has been rapidly worsening. Associated symptoms include cough (dry). Pertinent negatives include no fever, no coryza, no rhinorrhea, no sore throat, no sputum production, no hemoptysis, no chest pain, no syncope, no vomiting, no abdominal pain, no leg pain and no leg swelling. Risk factors include smoking. She has tried nothing for the symptoms. She has had prior hospitalizations. She has had prior ED visits. She has had prior ICU admissions. Associated medical issues include asthma, COPD, pneumonia, chronic lung disease, heart failure and past MI. Associated medical issues do not include PE, DVT or recent surgery.    Past Medical History:  Diagnosis Date  . Anxiety   . Asthma   . Complication of anesthesia    decreased bp, decreased heart rate  . Diabetes mellitus    class 2  . Disorder of nervous system   . Emphysema   . Heart attack ([place])   . High blood pressure   . High cholesterol   . Incontinence   . Manic depression ([place])   . Myocardial infarct ([place])   . Pneumonia 06/2015  . Seasonal allergies   . Seizures ([place])   . Sinus trouble   . Sleep apnea   . Stroke [facility])     Patient Active Problem List   Diagnosis Date Noted  . CAP (community acquired pneumonia) 06/22/2015  . Acute respiratory failure ([place]) 06/22/2015  . Acute respiratory failure with hypoxemia ([place]) 06/22/2015  . Scabies 06/01/2015  . Onychomycosis 01/21/2015  . Nasal congestion 12/17/2014  .  Type 2 diabetes mellitus with complication ([place])   . Tobacco abuse 07/22/2014  . COPD (chronic obstructive pulmonary disease) ([name]) 07/08/2014  . COPD exacerbation ([place]) 06/26/2014  . Seborrheic keratoses 12/31/2013  . Thrush 09/19/2013  . Knee pain, bilateral 01/22/2013  . Seizure ([place]) 01/04/2013  . Health care maintenance 11/25/2012  . Folliculitis 68/12/7515  . History of kidney stones 06/18/2012  . Chronic pain syndrome 06/18/2012  . Muscle spasm 06/18/2012  . Elevated serum creatinine 04/28/2012  . Asthma with acute exacerbation 04/28/2012  . Allergic reaction 04/07/2012  . Bipolar 1 disorder ([place]) 12/08/2011  . Itching 09/06/2011  . HSV infection 08/30/2011  . Dyslipidemia 04/24/2011  . Anemia 04/24/2011  . Depression with anxiety 04/24/2011  . Diabetic neuropathy ([place]) 04/24/2011  . OSA on CPAP 10/18/2010  . Asthma 10/18/2010  . Obesity 10/18/2010  . HTN (hypertension) 10/18/2010  . Type 2 diabetes mellitus ([place]) 10/18/2010    Past Surgical History:  Procedure Laterality Date  . ABDOMINAL HYSTERECTOMY    . CESAREAN SECTION    . HIATAL HERNIA REPAIR      OB History    No data available       Home Medications    Prior to Admission medications   Medication Sig Start Date End Date Taking? Authorizing Provider  ACCU-CHEK SOFTCLIX LANCETS lancets  07/28/14   Historical Provider, MD  albuterol (PROVENTIL HFA;VENTOLIN HFA) 108 (90 Base)  MCG/ACT inhaler Inhale 2 puffs into the lungs every 6 (six) hours as needed. 07/26/15   Archie Patten, MD  albuterol (PROVENTIL) (2.5 MG/3ML) 0.083% nebulizer solution Take 3 mLs (2.5 mg total) by nebulization every 6 (six) hours as needed for wheezing or shortness of breath. 10/20/14   Archie Patten, MD  amLODipine (NORVASC) 5 MG tablet Take 2 tablets (10 mg total) by mouth daily. 06/01/15   Archie Patten, MD  aspirin 81 MG EC tablet TAKE 1 TABLET (81 MG TOTAL) BY MOUTH DAILY. 08/03/14   Archie Patten, MD  atorvastatin  (LIPITOR) 40 MG tablet Take 1 tablet (40 mg total) by mouth daily. 07/26/15   Archie Patten, MD  Blood Glucose Monitoring Suppl (ACCU-CHEK AVIVA PLUS) w/Device KIT TEST FOUR TIMES DAILY 07/27/15   Archie Patten, MD  escitalopram (LEXAPRO) 20 MG tablet Take 1 tablet (20 mg total) by mouth daily. 07/16/15   Kathlee Nations, MD  fluticasone furoate-vilanterol (BREO ELLIPTA) 100-25 MCG/INH AEPB Inhale 1 puff into the lungs daily. 07/26/15   Archie Patten, MD  furosemide (LASIX) 40 MG tablet Take 1 tablet (40 mg total) by mouth daily. Take Lasix twice daily from 7/14 until 7/18, then daily 07/29/15   Archie Patten, MD  gabapentin (NEURONTIN) 600 MG tablet Take 1 tablet (600 mg total) by mouth 3 (three) times daily. 07/26/15   Archie Patten, MD  glucose blood test strip Use to check blood sugar 3 times daily. 07/05/15   Archie Patten, MD  lisinopril (PRINIVIL,ZESTRIL) 40 MG tablet Take 1 tablet (40 mg total) by mouth daily. 07/26/15   Archie Patten, MD  loratadine (CLARITIN) 10 MG tablet Take 1 tablet (10 mg total) by mouth daily. Patient not taking: Reported on 07/28/2015 07/26/15   Archie Patten, MD  metFORMIN (GLUCOPHAGE-XR) 500 MG 24 hr tablet Take 1 tablet (500 mg total) by mouth 2 (two) times daily. 07/26/15   Archie Patten, MD  mirtazapine (REMERON) 30 MG tablet Take 1 tablet (30 mg total) by mouth at bedtime. 07/16/15   Kathlee Nations, MD  montelukast (SINGULAIR) 10 MG tablet Take 1 tablet (10 mg total) by mouth at bedtime. 07/26/15   Archie Patten, MD  Multiple Vitamins-Minerals (MULTIVITAMIN WITH MINERALS) tablet Take 1 tablet by mouth daily. Reported on 07/28/2015    Historical Provider, MD  naproxen (NAPROSYN) 500 MG tablet Take 1 tablet (500 mg total) by mouth 3 (three) times daily as needed. 07/26/15   Archie Patten, MD  omeprazole (PRILOSEC) 40 MG capsule Take 1 capsule (40 mg total) by mouth daily. 07/01/15   Archie Patten, MD  oxybutynin (DITROPAN) 5 MG tablet Take 1  tablet (5 mg total) by mouth 3 (three) times daily. 04/15/15   Archie Patten, MD  potassium chloride (K-DUR,KLOR-CON) 10 MEQ tablet Take 2 tablets (20 mEq total) by mouth daily. 07/26/15   Archie Patten, MD  tiotropium (SPIRIVA HANDIHALER) 18 MCG inhalation capsule Place 1 capsule (18 mcg total) into inhaler and inhale daily. 07/26/15   Archie Patten, MD  verapamil (VERELAN PM) 240 MG 24 hr capsule Take 2 capsules (480 mg total) by mouth at bedtime. 07/26/15   Archie Patten, MD  ziprasidone (GEODON) 80 MG capsule Take 1 capsule (80 mg total) by mouth 2 (two) times daily with a meal. 07/16/15   Kathlee Nations, MD    Family History Family History  Problem Relation Age of  Onset  . Cancer Father     prostate  . Cancer Mother     lung  . Depression Mother   . Depression Sister   . Anxiety disorder Sister   . Schizophrenia Sister   . Bipolar disorder Sister   . Depression Sister   . Depression Brother   . Heart failure      cousin    Social History Social History  Substance Use Topics  . Smoking status: Current Every Day Smoker    Packs/day: 0.50    Years: 37.00    Types: Cigarettes    Start date: 01/16/1977  . Smokeless tobacco: Never Used     Comment: Previous 2 PPD menthol full flavor  . Alcohol use No     Allergies   Hydroxyzine; Codeine; and Sulfa antibiotics   Review of Systems Review of Systems  Constitutional: Negative for fever.  HENT: Negative for rhinorrhea and sore throat.   Eyes: Negative.   Respiratory: Positive for cough (dry) and shortness of breath. Negative for hemoptysis and sputum production.   Cardiovascular: Negative for chest pain, leg swelling and syncope.  Gastrointestinal: Negative for abdominal pain and vomiting.  Genitourinary: Negative.   Musculoskeletal: Negative.   Skin: Negative.   Neurological: Positive for light-headedness. Negative for syncope.  Psychiatric/Behavioral: Positive for agitation and confusion. The patient is  nervous/anxious.        Quickly improved with decreased tachypnea and improved hypoxia     Physical Exam Updated Vital Signs BP 135/77   Pulse 78   Resp 24   SpO2 (!) 84%   Physical Exam  Constitutional: [name] oriented to person, place, and time. She appears well-developed. She appears distressed (mod).  Obese  HENT:  Head: Normocephalic and atraumatic.  Eyes: Conjunctivae are normal. Right eye exhibits no discharge. Left eye exhibits no discharge.  Neck: Normal range of motion. Neck supple. No tracheal deviation present.  Cardiovascular: Normal rate, regular rhythm, normal heart sounds and intact distal pulses.   No murmur heard. Pulmonary/Chest: Effort normal. No stridor. No respiratory distress. She has no wheezes. She has rales (mild diffuse).  Abdominal: Soft. Bowel sounds are normal. She exhibits no distension. There is no tenderness. There is no rebound and no guarding.  Musculoskeletal: She exhibits edema (non pitting). She exhibits no tenderness.  Neurological: [name] alert and oriented to person, place, and time.  Skin: Skin is warm. Capillary refill takes less than 2 seconds. [name] diaphoretic (mild).  Nursing note and vitals reviewed.    ED Treatments / Results  Labs (all labs ordered are listed, but only abnormal results are displayed) Labs Reviewed  BASIC METABOLIC PANEL - Abnormal; Notable for the following:       Result Value   Glucose, Bld 122 (*)    Creatinine, Ser 1.18 (*)    GFR calc non Af Amer 51 (*)    GFR calc Af Amer 59 (*)    All other components within normal limits  CBC - Abnormal; Notable for the following:    WBC 15.6 (*)    RBC 3.78 (*)    Hemoglobin 10.6 (*)    MCHC 29.3 (*)    RDW 16.7 (*)    Platelets 493 (*)    All other components within normal limits  BRAIN NATRIURETIC PEPTIDE - Abnormal; Notable for the following:    B Natriuretic Peptide 180.0 (*)    All other components within normal limits  BASIC METABOLIC PANEL -  Abnormal; Notable  for the following:    CO2 33 (*)    Glucose, Bld 104 (*)    All other components within normal limits  CBC - Abnormal; Notable for the following:    WBC 15.1 (*)    RBC 3.74 (*)    Hemoglobin 10.3 (*)    HCT 35.7 (*)    MCHC 28.9 (*)    RDW 16.8 (*)    Platelets 451 (*)    All other components within normal limits  BLOOD GAS, ARTERIAL - Abnormal; Notable for the following:    pH, Arterial 7.257 (*)    pCO2 arterial 78.7 (*)    pO2, Arterial 75.8 (*)    Bicarbonate 33.9 (*)    Acid-Base Excess 7.0 (*)    All other components within normal limits  GLUCOSE, CAPILLARY - Abnormal; Notable for the following:    Glucose-Capillary 101 (*)    All other components within normal limits  BLOOD GAS, ARTERIAL - Abnormal; Notable for the following:    pH, Arterial 7.334 (*)    pCO2 arterial 63.2 (*)    pO2, Arterial 73.1 (*)    Bicarbonate 32.8 (*)    Acid-Base Excess 7.1 (*)    All other components within normal limits  GLUCOSE, CAPILLARY - Abnormal; Notable for the following:    Glucose-Capillary 131 (*)    All other components within normal limits  GLUCOSE, CAPILLARY - Abnormal; Notable for the following:    Glucose-Capillary 170 (*)    All other components within normal limits  I-STAT TROPOININ, ED - Abnormal; Notable for the following:    Troponin i, poc 0.11 (*)    All other components within normal limits  I-STAT ARTERIAL BLOOD GAS, ED - Abnormal; Notable for the following:    pH, Arterial 7.246 (*)    pCO2 arterial 71.5 (*)    pO2, Arterial 49.0 (*)    Bicarbonate 31.1 (*)    All other components within normal limits  I-STAT ARTERIAL BLOOD GAS, ED - Abnormal; Notable for the following:    pH, Arterial 7.317 (*)    pCO2 arterial 65.4 (*)    pO2, Arterial 67.0 (*)    Bicarbonate 33.7 (*)    Acid-Base Excess 6.0 (*)    All other components within normal limits  MRSA PCR SCREENING  TROPONIN I  TROPONIN I  TROPONIN I  URINE RAPID DRUG SCREEN, HOSP  PERFORMED    EKG  EKG Interpretation  Date/Time:  Thursday August 19 2015 15:01:06 EDT Ventricular Rate:  79 PR Interval:    QRS Duration: 69 QT Interval:  395 QTC Calculation: 453 R Axis:   72 Text Interpretation:  Sinus rhythm Low voltage, precordial leads Nonspecific ST and T wave abnormality When compared with ECG of 06/21/2015, No significant change was found Confirmed by [facility]  MD, DAVID ([id number]) on 08/20/2015 6:09:18 PM       Radiology Dg Chest Portable 1 View  Result Date: 08/19/2015 CLINICAL DATA:  Acute respiratory distress. Decreased oxygen saturation. EXAM: PORTABLE CHEST 1 VIEW COMPARISON:  07/29/2015 FINDINGS: The heart is enlarged but stable. Stable tortuosity and ectasia of the thoracic aorta. Asymmetric airspace process in the right lung could be asymmetric pulmonary edema or pneumonia/ aspiration. No definite pleural effusions. The bony thorax is intact. IMPRESSION: Stable cardiac enlargement. Asymmetric airspace process in the right lung could reflect asymmetric pulmonary edema or pneumonia/aspiration. Electronically Signed   By: Marijo Sanes M.D.   On: 08/19/2015 15:30    Procedures .Critical Care  Performed by: Darlina Rumpf Authorized by: Brantley Stage DUO   Critical care provider statement:    Critical care time (minutes):  20   Critical care was necessary to treat or prevent imminent or life-threatening deterioration of the following conditions:  Respiratory failure   Critical care was time spent personally by me on the following activities:  Development of treatment plan with patient or surrogate, discussions with consultants, evaluation of patient's response to treatment, examination of patient, obtaining history from patient or surrogate, review of old charts, re-evaluation of patient's condition, pulse oximetry, ordering and review of radiographic studies, ordering and review of laboratory studies and ordering and performing treatments and interventions   I assumed  direction of critical care for this patient from another provider in my specialty: no     (including critical care time)  Medications Ordered in ED Medications - No data to display   Initial Impression / Assessment and Plan / ED Course  I have reviewed the triage vital signs and the nursing notes.  Pertinent labs & imaging results that were available during my care of the patient were reviewed by me and considered in my medical decision making (see chart for details).  Clinical Course  Value Comment By Time  ED EKG within 10 minutes (Reviewed) Forde Dandy, MD 08/29 4713   55 year old female with history of obstructive lung disease on 6LPM at home O2 presents to the emergency department with significant hypoxia requiring nonrebreather to improve her saturations.  Screening blood work was obtained and initial ABG revealed both a possible  hypoxic and hypercapnic etiology to her respiratory failure.  As a result it was thought that patient would likely benefit from BiPAP.  Patient had significant improvement while on BiPAP with an improving PCO2 and PO2.  She also initially presented extremely anxious and tachypneic and appeared much calmer with improved tachypnea on reassessment after correction of her initial findings.  Mild troponin elevation was also identified even though the patient denied any chest pain.  EKG did not reveal any new significant ischemic changes.  Chest x-ray was concerning for possible pulmonary edema versus pneumonitis.  Patient has chronic cough which has not worsened recently in terms of characteristics of sputum or intensity. Thus Abx were not started in the Ed. Duo nebs ordered. Patient does not have a history of systolic heart failure, however does have thickening of the left ventricle, possibly consistent with early diastolic heart failure. Thus patient was given 67m IV lasix since she is on 454mPO qD. As a result patient likely is suffering from acute COPD and CHF  exacerbation.  Due to BiPAP requirements will need to go to stepdown unit.  Discussed admission with admitting team.  Results discussed with patient and family at bedside.  All questions answered.  Agrees with plan and states understanding.  Patient and vital signs remained stable while in the ED.  Final Clinical Impressions(s) / ED Diagnoses   Final diagnoses:  Hypoxia  SOB (shortness of breath)    New Prescriptions Current Discharge Medication List       LoDarlina RumpfMD 08/22/15 2251    DaForde DandyMD 08/23/15 1209

## 2015-08-19 NOTE — H&P (Signed)
[facility] Admission History and Physical Service Pager: [phone]  Patient name: Tiffany [name] Medical record number: [id number] Date of birth: 07/01/1960 Age: 55 y.o. Gender: female  Primary Care Provider: Kathrine Cords, MD Consultants: Pulmonology, Cardiology Code Status: FULL (confirmed on admission)  Chief Complaint: Shortness of breath  Assessment and Plan: Tiffany [name] is a 55 y.o. female presenting with acute respiratory failure.  PMHx significant for HTN, O2-dependent COPD on 6L at home, HTN, asthma, T2DM, HLD,  bipolar 1, tobacco abuse, remote history of suspected MI, OSA and seizures.    #Acute on chronic respiratory failure with hypoxemia  - likely secondary to COPD exacerbation vs untreated OSA vs cardiac etiology vs infectious process. Patient with history of COPD on 6L home O2,  hypoxemic to 84% O2 on room air, improved with BiPap.  BNP 180. ABG:  7.24/71/49 and patient was put on bipap, started on furosemide.  ABG improved to 7.31/65.4/67 and oxygen saturations improved.  CXR with stable cardiac enlargement and asymmetric R pulmonary edema vs pneumonia/aspiration.  Pt has been afebrile, did have recent admission for respiratory failure 2/2 CAP in 06/2015. - admit to SDU, attending Dr. Mingo Amber - continue continuous Bipap overnight - NPO while on Bipap - Maintain Sp02 above 92% - repeat ABG in AM - started doxycycline 100 mg BID, Duonebs, solumedrol 64m (tonight, then 669mBID starting tomorrow) - Lasix IV 4082mID - continue home singulair, symbicort, spiriva - Cycle troponin - Echocardiogram ordered - Strict I/Os - c/s Pulm: appreciate recs. F/u LE doppler, BiPap prn and qHS - c/s cardiology: appreciate recs - Smoking cessation  #elevated troponin - [place] with reassuring results, no prior stents,so likely no history of CAD.  Patient was found to have mildly elevated troponins at 0.11.  Denies chest  pain.  No EKG changes. Echo 19m46m with normal EF - per cardiology likely demand ischimia, however if troponins elevated may warrant further cardiac workup - trend troponins - however given elevated BNP (180) and possible pulmonary edema, consider repeat echo during this admission - Patient rec'd 20 IV lasix in ED.  Will give another 20 mg IV this evening and plan to start 40 mg BID in the morning. - Monitor kidney function, potassium - repeat EKG in AM, monitor on tele - continue atorvastatin, ASA - follow up UDS, was recently positive for cocaine in 06/2015 - repeat echo as above  #Mood disorder:  Followed by Dr. ArfeAdele Schilderontinue home ziprasidone 80 mg BID,  escitalopram 20 mg daily  #HTN- hypotensive on admission, though suspect that this was a result of the poorly fitting blood pressure cuff that kept falling off in ED.  Patient now normotensive - holding home norvasc, verapamil and lisinopril. - consider resuming home meds in am - Lasix as above. - monitor BP  #T2DM - Well controlled. Last HbA1C 5.6 on 06/01/15. - holding home metformin - continue home gabapentin - monitor CBG's q4h - SSI  #Tobacco abuse - patient has been strongly encouraged to quit.  Continues to smoke  Daily, >20 pack year history - nicotine 21 mg patch while hospitalized - continue smoking cessation counseling  #Hx seizures and neuropathy: - Continue home gabapentin 600 mg TID  FEN/GI: NPO until off of Bipap then HH/Carb mod diet, PPI Prophylaxis: Subcutaneous Heparin  Disposition: Admit to SDU for respiratory support with Bipap, transition to floor once stable off Bipap and tolerating supplemental O2.  Transition to PO  as tolerated.  Anticipated hospital stay 2-3 days.  History of Present Illness:  Tiffany [name] is a 55 y.o. female with PMHx of O2-dependent COPD on 6L home oxygen presenting with SOB of of two days duration that has worsened throughout the day today prompting her to come in to the  emergency department.  She denies recent fevers, no productive cough, no chills or fatigue.  Patient denies chest pain or chest tightness. Patient does endorse abusing tobacco, denies drug use however with record of positive cocaine on UDS 08/08/2015. In the ED the patient was noted to have hypoxic respiratory failure with saturations in the 80's on nonrebreather.  Patient was found to be hypercapnic with initial ABG 7.24/71/49. She was put on bipap, given a dose of IV Lasix in the ED, and oxygen saturations improved. ABG improved to 7.31/65.4/67, and endorsed feeling slight improvement in her symptoms.   Troponins noted to be mildly elevated at 0.11 with BNP elevated at 180 on admission.     She reports that she is compliant with medications at home.  She notes that she was supposed to start seeing a pulmonologist at the end of the month.  At baseline, she is completely independent of ADLs.  She resides with her son.  No sick contacts.   Review Of Systems: Per HPI with the following additions: Otherwise the remainder of the systems were negative.  Patient Active Problem List   Diagnosis Date Noted  . Hypoxia 08/19/2015  . CAP (community acquired pneumonia) 06/22/2015  . Acute respiratory failure ([place]) 06/22/2015  . Acute respiratory failure with hypoxemia ([place]) 06/22/2015  . Scabies 06/01/2015  . Onychomycosis 01/21/2015  . Nasal congestion 12/17/2014  . Type 2 diabetes mellitus with complication ([place])   . Tobacco abuse 07/22/2014  . COPD (chronic obstructive pulmonary disease) ([place]) 07/08/2014  . COPD exacerbation ([place]) 06/26/2014  . Seborrheic keratoses 12/31/2013  . Thrush 09/19/2013  . Knee pain, bilateral 01/22/2013  . Seizure ([place]) 01/04/2013  . Health care maintenance 11/25/2012  . Folliculitis 41/03/129  . History of kidney stones 06/18/2012  . Chronic pain syndrome 06/18/2012  . Muscle spasm 06/18/2012  . Elevated serum creatinine 04/28/2012  . Asthma with acute exacerbation  04/28/2012  . Allergic reaction 04/07/2012  . Bipolar 1 disorder ([place]) 12/08/2011  . Itching 09/06/2011  . HSV infection 08/30/2011  . Dyslipidemia 04/24/2011  . Anemia 04/24/2011  . Depression with anxiety 04/24/2011  . Diabetic neuropathy ([place]) 04/24/2011  . OSA on CPAP 10/18/2010  . Asthma 10/18/2010  . Obesity 10/18/2010  . HTN (hypertension) 10/18/2010  . Type 2 diabetes mellitus ([place]) 10/18/2010    Past Medical History: Past Medical History:  Diagnosis Date  . Anxiety   . Asthma   . Complication of anesthesia    decreased bp, decreased heart rate  . Diabetes mellitus    class 2  . Disorder of nervous system   . Emphysema   . Heart attack ([place])   . High blood pressure   . High cholesterol   . Incontinence   . Manic depression ([place])   . Myocardial infarct ([place])   . Pneumonia 06/2015  . Seasonal allergies   . Seizures ([place])   . Sinus trouble   . Sleep apnea   . Stroke [facility])     Past Surgical History: Past Surgical History:  Procedure Laterality Date  . ABDOMINAL HYSTERECTOMY    . CESAREAN SECTION    . HIATAL HERNIA REPAIR  Social History: Social History  Substance Use Topics  . Smoking status: Current Every Day Smoker    Packs/day: 0.50    Years: 37.00    Types: Cigarettes    Start date: 01/16/1977  . Smokeless tobacco: Never Used     Comment: Previous 2 PPD menthol full flavor  . Alcohol use No   Additional social history:  Please also refer to relevant sections of EMR.  Family History: Family History  Problem Relation Age of Onset  . Cancer Father     prostate  . Cancer Mother     lung  . Depression Mother   . Depression Sister   . Anxiety disorder Sister   . Schizophrenia Sister   . Bipolar disorder Sister   . Depression Sister   . Depression Brother   . Heart failure      cousin     Allergies and Medications: Allergies  Allergen Reactions  . Hydroxyzine Other (See Comments)    unknown  . Codeine Nausea And Vomiting  .  Sulfa Antibiotics Itching   No current facility-administered medications on file prior to encounter.    Current Outpatient Prescriptions on File Prior to Encounter  Medication Sig Dispense Refill  . ACCU-CHEK SOFTCLIX LANCETS lancets     . albuterol (PROVENTIL HFA;VENTOLIN HFA) 108 (90 Base) MCG/ACT inhaler Inhale 2 puffs into the lungs every 6 (six) hours as needed. (Patient taking differently: Inhale 2 puffs into the lungs every 6 (six) hours as needed for wheezing or shortness of breath. ) 1 Inhaler 6  . albuterol (PROVENTIL) (2.5 MG/3ML) 0.083% nebulizer solution Take 3 mLs (2.5 mg total) by nebulization every 6 (six) hours as needed for wheezing or shortness of breath. 150 mL 1  . amLODipine (NORVASC) 5 MG tablet Take 2 tablets (10 mg total) by mouth daily. 30 tablet 2  . aspirin 81 MG EC tablet TAKE 1 TABLET (81 MG TOTAL) BY MOUTH DAILY. (Patient taking differently: TAKE 1 TABLET (81 MG TOTAL) BY MOUTH every morning) 30 tablet 0  . atorvastatin (LIPITOR) 40 MG tablet Take 1 tablet (40 mg total) by mouth daily. 90 tablet 4  . Blood Glucose Monitoring Suppl (ACCU-CHEK AVIVA PLUS) w/Device KIT TEST FOUR TIMES DAILY 1 kit 0  . escitalopram (LEXAPRO) 20 MG tablet Take 1 tablet (20 mg total) by mouth daily. 90 tablet 0  . fluticasone furoate-vilanterol (BREO ELLIPTA) 100-25 MCG/INH AEPB Inhale 1 puff into the lungs daily. 84 each 0  . furosemide (LASIX) 40 MG tablet Take 1 tablet (40 mg total) by mouth daily. Take Lasix twice daily from 7/14 until 7/18, then daily (Patient taking differently: Take 40 mg by mouth daily. ) 30 tablet 0  . gabapentin (NEURONTIN) 600 MG tablet Take 1 tablet (600 mg total) by mouth 3 (three) times daily. 270 tablet 0  . glucose blood test strip Use to check blood sugar 3 times daily. 100 each 12  . lisinopril (PRINIVIL,ZESTRIL) 40 MG tablet Take 1 tablet (40 mg total) by mouth daily. 90 tablet 0  . loratadine (CLARITIN) 10 MG tablet Take 1 tablet (10 mg total) by mouth  daily. 90 tablet 0  . metFORMIN (GLUCOPHAGE-XR) 500 MG 24 hr tablet Take 1 tablet (500 mg total) by mouth 2 (two) times daily. 180 tablet 0  . mirtazapine (REMERON) 30 MG tablet Take 1 tablet (30 mg total) by mouth at bedtime. 90 tablet 0  . montelukast (SINGULAIR) 10 MG tablet Take 1 tablet (10 mg total) by mouth  at bedtime. 90 tablet 3  . Multiple Vitamins-Minerals (MULTIVITAMIN WITH MINERALS) tablet Take 1 tablet by mouth daily. Reported on 07/28/2015    . naproxen (NAPROSYN) 500 MG tablet Take 1 tablet (500 mg total) by mouth 3 (three) times daily as needed. (Patient taking differently: Take 500 mg by mouth 3 (three) times daily as needed for mild pain. ) 270 tablet 0  . omeprazole (PRILOSEC) 40 MG capsule Take 1 capsule (40 mg total) by mouth daily. 90 capsule 1  . oxybutynin (DITROPAN) 5 MG tablet Take 1 tablet (5 mg total) by mouth 3 (three) times daily. 270 tablet 3  . potassium chloride (K-DUR,KLOR-CON) 10 MEQ tablet Take 2 tablets (20 mEq total) by mouth daily. 180 tablet 2  . tiotropium (SPIRIVA HANDIHALER) 18 MCG inhalation capsule Place 1 capsule (18 mcg total) into inhaler and inhale daily. 90 capsule 4  . verapamil (VERELAN PM) 240 MG 24 hr capsule Take 2 capsules (480 mg total) by mouth at bedtime. 180 capsule 0  . ziprasidone (GEODON) 80 MG capsule Take 1 capsule (80 mg total) by mouth 2 (two) times daily with a meal. (Patient taking differently: Take 160 mg by mouth at bedtime. ) 180 capsule 0    Objective: BP (!) 107/49   Pulse 63   Resp 16   SpO2 95%  Exam: General: patient lies in bed with bipap in place, NAD Eyes: no conjunctival erythema or exudate ENTM: mucous membranes moist, EOMI Neck: full ROM Cardiovascular: RRR, no murmurs, +1 DP  Respiratory: +globally decreased breath sounds, +poor air movement, +slight wheeze in right upper lobe, no dyspnea with speech, on Bipap saturating in 90's Abdomen: obese, soft, nontender, nondistended, +BS MSK: full ROM in 4  extremities, 1+ edema LE bilaterally to mid shins Skin: warm and well-perfused. Neuro: AAOx3, follows commands, speech normal Psych: affect appropriate, mood stable  Labs and Imaging: CBC BMET   Recent Labs Lab 08/19/15 1452  WBC 15.6*  HGB 10.6*  HCT 36.2  PLT 493*    Recent Labs Lab 08/19/15 1452  NA 142  K 4.3  CL 102  CO2 28  BUN 10  CREATININE 1.18*  GLUCOSE 122*  CALCIUM 9.4     ABG    Component Value Date/Time   PHART 7.317 (L) 08/19/2015 1822   PCO2ART 65.4 (HH) 08/19/2015 1822   PO2ART 67.0 (L) 08/19/2015 1822   HCO3 33.7 (H) 08/19/2015 1822   TCO2 36 08/19/2015 1822   ACIDBASEDEF 0.5 06/24/2015 1510   O2SAT 91.0 08/19/2015 1822   ITroponin: 0.11  EKG: 08/19/2015 sinus rhythm, nonspecific ST-T wave changes.  No change from EKG 06/21/15.   Dg Chest Portable 1 View  Result Date: 08/19/2015 CLINICAL DATA:  Acute respiratory distress. Decreased oxygen saturation. EXAM: PORTABLE CHEST 1 VIEW COMPARISON:  07/29/2015 FINDINGS: The heart is enlarged but stable. Stable tortuosity and ectasia of the thoracic aorta. Asymmetric airspace process in the right lung could be asymmetric pulmonary edema or pneumonia/ aspiration. No definite pleural effusions. The bony thorax is intact. IMPRESSION: Stable cardiac enlargement. Asymmetric airspace process in the right lung could reflect asymmetric pulmonary edema or pneumonia/aspiration. Electronically Signed   By: Marijo Sanes M.D.   On: 08/19/2015 15:30    Everrett Coombe, MD 08/19/2015, 5:51 PM PGY-1, [place] pager: [phone], text pages welcome  I have separately seen and examined the patient. I have discussed the findings and exam with Dr Burr Medico and agree with the above note.  My changes/additions  are outlined in [place].    Ashly M. Lajuana Ripple, DO PGY-3, [facility]

## 2015-08-19 NOTE — Consult Note (Signed)
Admit date: 08/19/2015 Referring Physician  Dr. Oleta Mouse Primary Physician Kathrine Cords, MD Primary Cardiologist  [facility] Reason for Consultation  elevated troponin  HPI: 55 year old female with oxygen dependent COPD, 6 L at home, history of stroke in 2000 with cardiac cath in the past (no stent placement) with diabetes obstructive sleep apnea who over the past 1-2 days has been with increasing shortness of breath and found to have hypoxic respiratory failure on arrival with oxygen saturations in the 80s on nonrebreather mask. She also had hypercapnia noted on ABG as well without any specific infiltrate on x-ray although right lung could reflect pulmonary edema. Expiratory wheezes were noted by ED physician as well as rhonchorous lung sounds. She was placed on BiPAP and treated empirically for COPD as well as potential heart failure with diuretics.  Point-of-care troponin was 0.11. Mildly elevated. BNP was elevated at 180. Creatinine 1.1.  Blood gas on arrival was 7.24/71/49  Interestingly, urine toxicology on 07/09/15 demonstrated cocaine.  Dr. Lamonte Sakai has seen her in pulmonary clinic for obstruction sleep apnea and CPAP as well as asthmatic bronchitis.  She still continues to smoke, her son says 24/ 7 usually. Last night she did not have a cigarette.   Currently she is feeling slightly improved on BiPAP mask and post Lasix IV.  She denies any recent fevers although she has had orthopnea for many years.  PMH:   Past Medical History:  Diagnosis Date  . Anxiety   . Asthma   . Complication of anesthesia    decreased bp, decreased heart rate  . Diabetes mellitus    class 2  . Disorder of nervous system   . Emphysema   . Heart attack ([place])   . High blood pressure   . High cholesterol   . Incontinence   . Manic depression ([place])   . Myocardial infarct ([place])   . Pneumonia 06/2015  . Seasonal allergies   . Seizures ([place])   . Sinus trouble   . Sleep apnea   . Stroke [facility])     PSH:     Past Surgical History:  Procedure Laterality Date  . ABDOMINAL HYSTERECTOMY    . CESAREAN SECTION    . HIATAL HERNIA REPAIR     Allergies:  Hydroxyzine; Codeine; and Sulfa antibiotics Prior to Admit Meds:   Prior to Admission medications   Medication Sig Start Date End Date Taking? Authorizing Provider  ACCU-CHEK SOFTCLIX LANCETS lancets  07/28/14  Yes Historical Provider, MD  albuterol (PROVENTIL HFA;VENTOLIN HFA) 108 (90 Base) MCG/ACT inhaler Inhale 2 puffs into the lungs every 6 (six) hours as needed. Patient taking differently: Inhale 2 puffs into the lungs every 6 (six) hours as needed for wheezing or shortness of breath.  07/26/15  Yes Archie Patten, MD  albuterol (PROVENTIL) (2.5 MG/3ML) 0.083% nebulizer solution Take 3 mLs (2.5 mg total) by nebulization every 6 (six) hours as needed for wheezing or shortness of breath. 10/20/14  Yes Archie Patten, MD  amLODipine (NORVASC) 5 MG tablet Take 2 tablets (10 mg total) by mouth daily. 06/01/15  Yes Archie Patten, MD  aspirin 81 MG EC tablet TAKE 1 TABLET (81 MG TOTAL) BY MOUTH DAILY. Patient taking differently: TAKE 1 TABLET (81 MG TOTAL) BY MOUTH every morning 08/03/14  Yes Crystal Libby Maw, MD  atorvastatin (LIPITOR) 40 MG tablet Take 1 tablet (40 mg total) by mouth daily. 07/26/15  Yes Archie Patten, MD  Blood Glucose Monitoring Suppl (ACCU-CHEK AVIVA PLUS)  w/Device KIT TEST FOUR TIMES DAILY 07/27/15  Yes Archie Patten, MD  escitalopram (LEXAPRO) 20 MG tablet Take 1 tablet (20 mg total) by mouth daily. 07/16/15  Yes Kathlee Nations, MD  fluticasone furoate-vilanterol (BREO ELLIPTA) 100-25 MCG/INH AEPB Inhale 1 puff into the lungs daily. 07/26/15  Yes Archie Patten, MD  furosemide (LASIX) 40 MG tablet Take 1 tablet (40 mg total) by mouth daily. Take Lasix twice daily from 7/14 until 7/18, then daily Patient taking differently: Take 40 mg by mouth daily.  07/29/15  Yes Archie Patten, MD  gabapentin (NEURONTIN) 600 MG tablet Take 1  tablet (600 mg total) by mouth 3 (three) times daily. 07/26/15  Yes Archie Patten, MD  glucose blood test strip Use to check blood sugar 3 times daily. 07/05/15  Yes Archie Patten, MD  lisinopril (PRINIVIL,ZESTRIL) 40 MG tablet Take 1 tablet (40 mg total) by mouth daily. 07/26/15  Yes Archie Patten, MD  loratadine (CLARITIN) 10 MG tablet Take 1 tablet (10 mg total) by mouth daily. 07/26/15  Yes Archie Patten, MD  metFORMIN (GLUCOPHAGE-XR) 500 MG 24 hr tablet Take 1 tablet (500 mg total) by mouth 2 (two) times daily. 07/26/15  Yes Archie Patten, MD  mirtazapine (REMERON) 30 MG tablet Take 1 tablet (30 mg total) by mouth at bedtime. 07/16/15  Yes Kathlee Nations, MD  montelukast (SINGULAIR) 10 MG tablet Take 1 tablet (10 mg total) by mouth at bedtime. 07/26/15  Yes Archie Patten, MD  Multiple Vitamins-Minerals (MULTIVITAMIN WITH MINERALS) tablet Take 1 tablet by mouth daily. Reported on 07/28/2015   Yes Historical Provider, MD  naproxen (NAPROSYN) 500 MG tablet Take 1 tablet (500 mg total) by mouth 3 (three) times daily as needed. Patient taking differently: Take 500 mg by mouth 3 (three) times daily as needed for mild pain.  07/26/15  Yes Archie Patten, MD  omeprazole (PRILOSEC) 40 MG capsule Take 1 capsule (40 mg total) by mouth daily. 07/01/15  Yes Archie Patten, MD  oxybutynin (DITROPAN) 5 MG tablet Take 1 tablet (5 mg total) by mouth 3 (three) times daily. 04/15/15  Yes Archie Patten, MD  potassium chloride (K-DUR,KLOR-CON) 10 MEQ tablet Take 2 tablets (20 mEq total) by mouth daily. 07/26/15  Yes Archie Patten, MD  SYMBICORT 160-4.5 MCG/ACT inhaler Inhale 2 puffs into the lungs 2 (two) times daily.  07/06/15  Yes Historical Provider, MD  tiotropium (SPIRIVA HANDIHALER) 18 MCG inhalation capsule Place 1 capsule (18 mcg total) into inhaler and inhale daily. 07/26/15  Yes Archie Patten, MD  verapamil (VERELAN PM) 240 MG 24 hr capsule Take 2 capsules (480 mg total) by mouth at bedtime.  07/26/15  Yes Archie Patten, MD  ziprasidone (GEODON) 80 MG capsule Take 1 capsule (80 mg total) by mouth 2 (two) times daily with a meal. Patient taking differently: Take 160 mg by mouth at bedtime.  07/16/15  Yes Kathlee Nations, MD   Fam HX:    Family History  Problem Relation Age of Onset  . Cancer Father     prostate  . Cancer Mother     lung  . Depression Mother   . Depression Sister   . Anxiety disorder Sister   . Schizophrenia Sister   . Bipolar disorder Sister   . Depression Sister   . Depression Brother   . Heart failure      cousin   Social HX:  Social History   Social History  . Marital status: Widowed    Spouse name: N/A  . Number of children: 3  . Years of education: N/A   Occupational History  . disabled     factory production   Social History Main Topics  . Smoking status: Current Every Day Smoker    Packs/day: 0.50    Years: 37.00    Types: Cigarettes    Start date: 01/16/1977  . Smokeless tobacco: Never Used     Comment: Previous 2 PPD menthol full flavor  . Alcohol use No  . Drug use: No  . Sexual activity: No   Other Topics Concern  . Not on file   Social History Narrative  . No narrative on file     ROS:  All 11 ROS were addressed and are negative except what is stated in the HPI   Physical Exam: Blood pressure (!) 107/49, pulse 63, resp. rate 16, SpO2 95 %.   General: Overweight, well nourished, in no mild respiratory distress Head: Eyes PERRLA, No xanthomas.   Normal cephalic and atramatic  Lungs:   Rhonchorous bilaterally, increased respiratory rate, wheezing throughout.Currently on BiPAP Heart:   HRRR S1 S2 Pulses are 2+ & equal. No murmur, rubs, gallops.  No carotid bruit. Difficult to obtain JVD, thick neck.  No abdominal bruits.  Abdomen: Bowel sounds are positive, abdomen soft and non-tender without masses. No hepatosplenomegaly.Obese Msk:  Back normal. Normal strength and tone for age. Able to sit up Extremities:  No  clubbing, cyanosis, 2+ lower extremity edema.  DP +1 Neuro: Alert and oriented X 3, non-focal, MAE x 4 GU: Deferred Rectal: Deferred Psych:  Moderately Ill-appearing, responds appropriately      Labs: Lab Results  Component Value Date   WBC 15.6 (H) 08/19/2015   HGB 10.6 (L) 08/19/2015   HCT 36.2 08/19/2015   MCV 95.8 08/19/2015   PLT 493 (H) 08/19/2015     Recent Labs Lab 08/19/15 1452  NA 142  K 4.3  CL 102  CO2 28  BUN 10  CREATININE 1.18*  CALCIUM 9.4  GLUCOSE 122*   No results for input(s): CKTOTAL, CKMB, TROPONINI in the last 72 hours. Lab Results  Component Value Date   CHOL 112 07/15/2013   HDL 35 (L) 07/15/2013   LDLCALC 54 07/15/2013   TRIG 115 07/15/2013   No results found for: DDIMER   Radiology:  Dg Chest Portable 1 View  Result Date: 08/19/2015 CLINICAL DATA:  Acute respiratory distress. Decreased oxygen saturation. EXAM: PORTABLE CHEST 1 VIEW COMPARISON:  07/29/2015 FINDINGS: The heart is enlarged but stable. Stable tortuosity and ectasia of the thoracic aorta. Asymmetric airspace process in the right lung could be asymmetric pulmonary edema or pneumonia/ aspiration. No definite pleural effusions. The bony thorax is intact. IMPRESSION: Stable cardiac enlargement. Asymmetric airspace process in the right lung could reflect asymmetric pulmonary edema or pneumonia/aspiration. Electronically Signed   By: Marijo Sanes M.D.   On: 08/19/2015 15:30   Personally viewed.  EKG:  08/19/15-sinus rhythm, nonspecific ST-T wave changes, baseline wander artifact. No specific change from prior EKG on 06/21/15. Personally viewed.   Echocardiogram 07/08/14: - Left ventricle: The cavity size was normal. Wall thickness was  increased in a pattern of mild LVH. Systolic function was normal.   The estimated ejection fraction was in the range of 55% to 60%. - Mitral valve: There was mild regurgitation. - Left atrium: The atrium was mildly dilated.  ASSESSMENT/PLAN:  55 year old female with acute respiratory failure, hypoxia, elevated troponin, prior history of stroke, morbid obesity, diabetes, tobacco use, recent cocaine use.  Elevated troponin  - Obtain serial serum troponins  - Point-of-care troponin minimally elevated at 0.11 in the setting of acute hypoxic respiratory failure. Most likely at this point demand ischemia. If troponin significantly elevates however further cardiac testing may be warranted.  - Echocardiogram 14 months ago demonstrated normal ejection fraction. Given this acute exacerbation and possibility of pulmonary edema and mildly elevated BNP of 180 it would not be unreasonable to repeat echocardiogram during this admission.  - Empirically treating with IV Lasix is not unreasonable as well as long as she does not demonstrate any signs of sepsis or significant hypotension.  - In review of a previous consult note in 2013,a  few years ago she did undergo a cardiac catheterization in [place] but this was a reassuring study. No prior stents. She also states that she was "allergic to anesthesia "needed to have several cardiac studies done to make sure that the machines kept her alive".  I do not believe that she has had a prior history of CAD based upon this prior historical data.   Hypoxic respiratory failure, acute on chronic  - Likely respiratory driving current illness  - IV Lasix not unreasonable to continue as a component of acute diastolic heart failure may be playing a role given her baseline morbid obesity. Consider a dosage such as 40 mg IV twice a day. Monitor potassium closely. Monitor creatinine closely.  History of cocaine use  - In June 2017 urine was positive for cocaine.  - Encourage continued cessation.  Bipolar disorder  - Per primary team.  Tobacco use  - Heavily encouraged her to quit. Her son stated "do it for the doctor ". [name] on 6 L of oxygen at home. I expressed the dangers of continued smoking and  increase mortality risk.  Morbid obesity  - Continue to encourage weight loss.  We will follow along.  Candee Furbish, MD  08/19/2015  6:39 PM

## 2015-08-19 NOTE — ED Provider Notes (Signed)
I saw and evaluated the patient, reviewed the resident's note and I agree with the findings and plan.     EKG Interpretation None      I confirm that I have examined the patient, was present during the key aspects of the procedure.   I have independently reviewed the following tracings and/or images and used them in my medical decision making: CXR  55 year old female with history of CAD, COPD on 6 L, DM, and OSA who presents with dyspnea for 1-2 days. Increased LE edema, no orthopnea or PND. Mild cough, sputum production, no fever or chills. Per family, was confused earlier today. Noted to have hypoxic respiratory failure on arrival, with spo2 high 80s on NRB. With hypercapnia on ABG as well. No infiltrate on chest x-ray. AO x 4 and grossly neuro in tact.  Lungs sound rhonchorous with some expiratory wheezing. Question potential heart failure clinically as well as COPD/emphysema exacerbation. Placed on BiPAP for hypoxic and hypercapnic respiratory failure with improvement. [name] treated for COPD as well as potential heart failure with breathing treatments, steroids, and diuretics. Admitted to stepdown for ongoing management.  CRITICAL CARE Performed by: Forde Dandy   Total critical care time: 35 minutes  Critical care time was exclusive of separately billable procedures and treating other patients.  Critical care was necessary to treat or prevent imminent or life-threatening deterioration.  Critical care was time spent personally by me on the following activities: development of treatment plan with patient and/or surrogate as well as nursing, discussions with consultants, evaluation of patient's response to treatment, examination of patient, obtaining history from patient or surrogate, ordering and performing treatments and interventions, ordering and review of laboratory studies, ordering and review of radiographic studies, pulse oximetry and re-evaluation of patient's condition.       Forde Dandy, MD 08/19/15 [phone]

## 2015-08-19 NOTE — ED Triage Notes (Signed)
Pt reports not feeling well today. resp labored on arrival to triage, spo2 initially 25%. Pt placed on nrb and became more alert and responsive.

## 2015-08-19 NOTE — ED Notes (Signed)
Trop of 0.11 given to Oleta Mouse, MD

## 2015-08-19 NOTE — Progress Notes (Signed)
istat ABG results given to Dr. Oleta Mouse.  No new RT orders received. ED RT aware.

## 2015-08-19 NOTE — Progress Notes (Signed)
ABG results with panic values RBV to Dr Oleta Mouse. MD ordered BIPAP. Placed pt on BIPAP 16/8 100%. Pt tol well. Will cont to monitor

## 2015-08-19 NOTE — ED Notes (Signed)
Resident at bedside, RT at bedside

## 2015-08-19 NOTE — Consult Note (Signed)
Name: Tiffany [name] MRN: [id number] DOB: [birth year]    ADMISSION DATE:  08/19/2015 CONSULTATION DATE:  8/3  REFERRING MD :  Dr. Carlisle Cater  CHIEF COMPLAINT:  SOB  HISTORY OF PRESENT ILLNESS:  55 year old female with PMH as below, which includes Asthma/COPD on 6L [place] at home (no PFT available, has seen RB in past), DM, HTN, OSA on CPAP (has not been wearing since starting home O2), Seizures, and CVA. She was recently admitted for what was described as a pretty severe CAP requiring ICU monitoring and BiPAP. She was initially very hypoxemic, however, her respiratory distress was relatively minimal. She left from that admission AMA and was started on home O2, which she now wears at 6 liters "as needed" which has been pretty much constantly the past few weeks. She has had worsening SOB since 8/2 and minimally productive cough with grey sputum. Denies fever and chills. Family reported a little bit of confusion 8/3 and she presented to ED where she was found to be hypoxic and hypercapnic. She was started on BiPAP and planned for admission to [place]. PCCM consulted.   SIGNIFICANT EVENTS  6/5 admit for CAP, discharged on home O2 6/23 admit for suicidal ideation 8/3 admit for COPD/CHF  STUDIES:    PAST MEDICAL HISTORY :   has a past medical history of Anxiety; Asthma; Complication of anesthesia; Diabetes mellitus; Disorder of nervous system; Emphysema; Heart attack ([place]); High blood pressure; High cholesterol; Incontinence; Manic depression ([place]); Myocardial infarct ([place]); Pneumonia (06/2015); Seasonal allergies; Seizures ([place]); Sinus trouble; Sleep apnea; and Stroke ([place]).  has a past surgical history that includes Abdominal hysterectomy; Cesarean section; and Hiatal hernia repair. Prior to Admission medications   Medication Sig Start Date End Date Taking? Authorizing Provider  ACCU-CHEK SOFTCLIX LANCETS lancets  07/28/14  Yes Historical Provider, MD  albuterol (PROVENTIL HFA;VENTOLIN HFA) 108 (90  Base) MCG/ACT inhaler Inhale 2 puffs into the lungs every 6 (six) hours as needed. Patient taking differently: Inhale 2 puffs into the lungs every 6 (six) hours as needed for wheezing or shortness of breath.  07/26/15  Yes Archie Patten, MD  albuterol (PROVENTIL) (2.5 MG/3ML) 0.083% nebulizer solution Take 3 mLs (2.5 mg total) by nebulization every 6 (six) hours as needed for wheezing or shortness of breath. 10/20/14  Yes Archie Patten, MD  amLODipine (NORVASC) 5 MG tablet Take 2 tablets (10 mg total) by mouth daily. 06/01/15  Yes Archie Patten, MD  aspirin 81 MG EC tablet TAKE 1 TABLET (81 MG TOTAL) BY MOUTH DAILY. Patient taking differently: TAKE 1 TABLET (81 MG TOTAL) BY MOUTH every morning 08/03/14  Yes Crystal Libby Maw, MD  atorvastatin (LIPITOR) 40 MG tablet Take 1 tablet (40 mg total) by mouth daily. 07/26/15  Yes Archie Patten, MD  Blood Glucose Monitoring Suppl (ACCU-CHEK AVIVA PLUS) w/Device KIT TEST FOUR TIMES DAILY 07/27/15  Yes Archie Patten, MD  escitalopram (LEXAPRO) 20 MG tablet Take 1 tablet (20 mg total) by mouth daily. 07/16/15  Yes Kathlee Nations, MD  fluticasone furoate-vilanterol (BREO ELLIPTA) 100-25 MCG/INH AEPB Inhale 1 puff into the lungs daily. 07/26/15  Yes Archie Patten, MD  furosemide (LASIX) 40 MG tablet Take 1 tablet (40 mg total) by mouth daily. Take Lasix twice daily from 7/14 until 7/18, then daily Patient taking differently: Take 40 mg by mouth daily.  07/29/15  Yes Archie Patten, MD  gabapentin (NEURONTIN) 600 MG tablet Take 1 tablet (600 mg total)  by mouth 3 (three) times daily. 07/26/15  Yes Archie Patten, MD  glucose blood test strip Use to check blood sugar 3 times daily. 07/05/15  Yes Archie Patten, MD  lisinopril (PRINIVIL,ZESTRIL) 40 MG tablet Take 1 tablet (40 mg total) by mouth daily. 07/26/15  Yes Archie Patten, MD  loratadine (CLARITIN) 10 MG tablet Take 1 tablet (10 mg total) by mouth daily. 07/26/15  Yes Archie Patten, MD  metFORMIN  (GLUCOPHAGE-XR) 500 MG 24 hr tablet Take 1 tablet (500 mg total) by mouth 2 (two) times daily. 07/26/15  Yes Archie Patten, MD  mirtazapine (REMERON) 30 MG tablet Take 1 tablet (30 mg total) by mouth at bedtime. 07/16/15  Yes Kathlee Nations, MD  montelukast (SINGULAIR) 10 MG tablet Take 1 tablet (10 mg total) by mouth at bedtime. 07/26/15  Yes Archie Patten, MD  Multiple Vitamins-Minerals (MULTIVITAMIN WITH MINERALS) tablet Take 1 tablet by mouth daily. Reported on 07/28/2015   Yes Historical Provider, MD  naproxen (NAPROSYN) 500 MG tablet Take 1 tablet (500 mg total) by mouth 3 (three) times daily as needed. Patient taking differently: Take 500 mg by mouth 3 (three) times daily as needed for mild pain.  07/26/15  Yes Archie Patten, MD  omeprazole (PRILOSEC) 40 MG capsule Take 1 capsule (40 mg total) by mouth daily. 07/01/15  Yes Archie Patten, MD  oxybutynin (DITROPAN) 5 MG tablet Take 1 tablet (5 mg total) by mouth 3 (three) times daily. 04/15/15  Yes Archie Patten, MD  potassium chloride (K-DUR,KLOR-CON) 10 MEQ tablet Take 2 tablets (20 mEq total) by mouth daily. 07/26/15  Yes Archie Patten, MD  SYMBICORT 160-4.5 MCG/ACT inhaler Inhale 2 puffs into the lungs 2 (two) times daily.  07/06/15  Yes Historical Provider, MD  tiotropium (SPIRIVA HANDIHALER) 18 MCG inhalation capsule Place 1 capsule (18 mcg total) into inhaler and inhale daily. 07/26/15  Yes Archie Patten, MD  verapamil (VERELAN PM) 240 MG 24 hr capsule Take 2 capsules (480 mg total) by mouth at bedtime. 07/26/15  Yes Archie Patten, MD  ziprasidone (GEODON) 80 MG capsule Take 1 capsule (80 mg total) by mouth 2 (two) times daily with a meal. Patient taking differently: Take 160 mg by mouth at bedtime.  07/16/15  Yes Kathlee Nations, MD   Allergies  Allergen Reactions  . Hydroxyzine Other (See Comments)    unknown  . Codeine Nausea And Vomiting  . Sulfa Antibiotics Itching    FAMILY HISTORY:  family history includes Anxiety  disorder in her sister; Bipolar disorder in her sister; Cancer in her father and mother; Depression in her brother, mother, sister, and sister; Schizophrenia in her sister. SOCIAL HISTORY:  reports that she has been smoking Cigarettes.  She started smoking about 38 years ago. She has a 18.50 pack-year smoking history. She has never used smokeless tobacco. She reports that she does not drink alcohol or use drugs.  REVIEW OF SYSTEMS:   Bolds are positive  Constitutional: weight loss, gain, night sweats, Fevers, chills, fatigue .  HEENT: headaches, Sore throat, sneezing, nasal congestion, post nasal drip, Difficulty swallowing, Tooth/dental problems, visual complaints visual changes, ear ache CV:  chest pain, radiates: ,Orthopnea, PND, swelling in lower extremities, dizziness, palpitations, syncope.  GI  heartburn, indigestion, abdominal pain, nausea, vomiting, diarrhea, change in bowel habits, loss of appetite, bloody stools.  Resp: cough, productive: , hemoptysis, dyspnea, chest pain, pleuritic.  Skin: rash or itching or icterus GU:  dysuria, change in color of urine, urgency or frequency. flank pain, hematuria  MS: joint pain or swelling. decreased range of motion  Psych: change in mood or affect. depression or anxiety.  Neuro: difficulty with speech, weakness, numbness, ataxia    SUBJECTIVE:   VITAL SIGNS: Pulse Rate:  [60-78] 60 (08/03 2015) Resp:  [12-28] 17 (08/03 2015) BP: (94-161)/(49-106) 127/97 (08/03 2015) SpO2:  [84 %-100 %] 99 % (08/03 2015) FiO2 (%):  [100 %] 100 % (08/03 1520)  PHYSICAL EXAMINATION: General:  Obese female in NAD on BiPAP Neuro:  Alert, oriented, non-focal HEENT:  [place]/AT, PERRL, no appreciable JVD Cardiovascular:  RRR, no MRG Lungs:  Coarse wheeze Abdomen:  Soft, non-tender, non-distended Musculoskeletal:  No acute deformity or ROM limitation. L>R BLE edema Skin:  Grossly intact   Recent Labs Lab 08/19/15 1452  NA 142  K 4.3  CL 102  CO2 28  BUN  10  CREATININE 1.18*  GLUCOSE 122*    Recent Labs Lab 08/19/15 1452  HGB 10.6*  HCT 36.2  WBC 15.6*  PLT 493*   Dg Chest Portable 1 View  Result Date: 08/19/2015 CLINICAL DATA:  Acute respiratory distress. Decreased oxygen saturation. EXAM: PORTABLE CHEST 1 VIEW COMPARISON:  07/29/2015 FINDINGS: The heart is enlarged but stable. Stable tortuosity and ectasia of the thoracic aorta. Asymmetric airspace process in the right lung could be asymmetric pulmonary edema or pneumonia/ aspiration. No definite pleural effusions. The bony thorax is intact. IMPRESSION: Stable cardiac enlargement. Asymmetric airspace process in the right lung could reflect asymmetric pulmonary edema or pneumonia/aspiration. Electronically Signed   By: Marijo Sanes M.D.   On: 08/19/2015 15:30    ASSESSMENT / PLAN:  Acute on chronic mixed respiratory failure: Multifactorial in the setting of suspected COPD exacerbation and decompensated OSA/OHS. Also concern for some volume overload/pulmonary edema. She has turned around pretty quickly here in the ED with the initiation of BiPAP, BDs, and low dose lasix. I am giving her a trial off BiPAP for now, O2 sats on 6L in high 80s, suspect this is not far off of her baseline.  Plan: - BiPAP PRN and mandatory QHS - Duoneb q 6 hours and PRN albuterol, would hold outpatient nebs while acutely ill - Solumedrol IV 28m q 12 hours, suspect can taper quickly over several days.  - Incentive spirometry and flutter valve - Aggressive diuresis, fluid restrict, Strict I&O - Doxycycline for 5 day course - Very important that she sees PCCM in follow up as outpatient for optimization of her multiple therapies. She will at the very least need PFTs and polysomnogram. Suspect at this point she is a candidate for home BiPAP.  - She says she quit smoking yesterday, she needs to stick to that. She has been smoking on 6L O2. I explained the risks to her.   Will continue to follow  PGeorgann Housekeeper  AGACNP-BC [facility] Pager [phone] 08/19/2015 8:53 PM  Attending Note:  I have examined patient, reviewed labs, studies and notes. I have discussed the case with PJaclynn Guarneri and I agree with the data and plans as amended above. 55yo woman, current smoker, known to me from outpt clinic. She has obesity, OSA, COPD / asthma. She was admitted in June for dyspnea, fluid overload, bronchospasm. She reportedly has not been able to tolerate her CPAP at night. She returns today with wheeze, volume overload. Required BiPAP for a short period with improvement. On my eval she is still  wheezing but is stable and comfortable off BiPAP. I believe she has decompensated secondary PAH due to untreated OSA. Also has findings consistent with AE-asthma/COPD. she unfortunately continues to smoke.  I agree with steroids, scheduled BDs, diuresis as she can tolerate. She has a swollen LLE, more than the R. We will check a LE doppler US to assess. She needs to use nocturnal CPAP vs BiPAp. We will need to follow with her as an outpt to stabilize her on NIPPV at night, check her pulmonary pressures. Also needs smoking cessation.     Baltazar Apo, MD, PhD 08/19/2015, 9:08 PM [facility] [phone] or if no answer [phone]

## 2015-08-20 ENCOUNTER — Inpatient Hospital Stay (HOSPITAL_COMMUNITY): Payer: Commercial Managed Care - HMO

## 2015-08-20 ENCOUNTER — Other Ambulatory Visit: Payer: Self-pay | Admitting: *Deleted

## 2015-08-20 ENCOUNTER — Ambulatory Visit: Payer: Self-pay | Admitting: *Deleted

## 2015-08-20 DIAGNOSIS — R0600 Dyspnea, unspecified: Secondary | ICD-10-CM

## 2015-08-20 DIAGNOSIS — R7989 Other specified abnormal findings of blood chemistry: Secondary | ICD-10-CM

## 2015-08-20 DIAGNOSIS — J441 Chronic obstructive pulmonary disease with (acute) exacerbation: Secondary | ICD-10-CM

## 2015-08-20 DIAGNOSIS — R0602 Shortness of breath: Secondary | ICD-10-CM

## 2015-08-20 DIAGNOSIS — E785 Hyperlipidemia, unspecified: Secondary | ICD-10-CM

## 2015-08-20 DIAGNOSIS — R609 Edema, unspecified: Secondary | ICD-10-CM

## 2015-08-20 LAB — RAPID URINE DRUG SCREEN, HOSP PERFORMED
Amphetamines: NOT DETECTED
Barbiturates: NOT DETECTED
Benzodiazepines: NOT DETECTED
Cocaine: NOT DETECTED
Opiates: NOT DETECTED
Tetrahydrocannabinol: NOT DETECTED

## 2015-08-20 LAB — ECHOCARDIOGRAM COMPLETE
Height: 64 in
Weight: 4137.59 oz

## 2015-08-20 LAB — BLOOD GAS, ARTERIAL
Acid-Base Excess: 7 mmol/L — ABNORMAL HIGH (ref 0.0–2.0)
Acid-Base Excess: 7.1 mmol/L — ABNORMAL HIGH (ref 0.0–2.0)
Bicarbonate: 33.9 mEq/L — ABNORMAL HIGH (ref 20.0–24.0)
Bicarbonate: 32.8 mEq/L — ABNORMAL HIGH (ref 20.0–24.0)
Delivery systems: POSITIVE
Drawn by: 460661
Drawn by: 33100
Expiratory PAP: 8
Expiratory PAP: 8
FIO2: 1
FIO2: 0.7
Inspiratory PAP: 16
Inspiratory PAP: 20
Mode: POSITIVE
Mode: POSITIVE
O2 Saturation: 91.9 %
O2 Saturation: 92.9 %
Patient temperature: 98.6
Patient temperature: 98.3
RATE: 14 resp/min
TCO2: 36.3 mmol/L (ref 0–100)
TCO2: 34.8 mmol/L (ref 0–100)
pCO2 arterial: 78.7 mmHg (ref 35.0–45.0)
pCO2 arterial: 63.2 mmHg (ref 35.0–45.0)
pH, Arterial: 7.257 — ABNORMAL LOW (ref 7.350–7.450)
pH, Arterial: 7.334 — ABNORMAL LOW (ref 7.350–7.450)
pO2, Arterial: 75.8 mmHg — ABNORMAL LOW (ref 80.0–100.0)
pO2, Arterial: 73.1 mmHg — ABNORMAL LOW (ref 80.0–100.0)

## 2015-08-20 LAB — BASIC METABOLIC PANEL
Anion gap: 7 (ref 5–15)
BUN: 9 mg/dL (ref 6–20)
CO2: 33 mmol/L — ABNORMAL HIGH (ref 22–32)
Calcium: 9.2 mg/dL (ref 8.9–10.3)
Chloride: 101 mmol/L (ref 101–111)
Creatinine, Ser: 0.97 mg/dL (ref 0.44–1.00)
GFR calc Af Amer: >60 mL/min (ref >60)
GFR calc non Af Amer: >60 mL/min (ref >60)
Glucose, Bld: 104 mg/dL — ABNORMAL HIGH (ref 65–99)
Potassium: 4.1 mmol/L (ref 3.5–5.1)
Sodium: 141 mmol/L (ref 135–145)

## 2015-08-20 LAB — GLUCOSE, CAPILLARY
Glucose-Capillary: 131 mg/dL — ABNORMAL HIGH (ref 65–99)
Glucose-Capillary: 170 mg/dL — ABNORMAL HIGH (ref 65–99)
Glucose-Capillary: 121 mg/dL — ABNORMAL HIGH (ref 65–99)
Glucose-Capillary: 126 mg/dL — ABNORMAL HIGH (ref 65–99)

## 2015-08-20 LAB — CBC
HCT: 35.7 % — ABNORMAL LOW (ref 36.0–46.0)
Hemoglobin: 10.3 g/dL — ABNORMAL LOW (ref 12.0–15.0)
MCH: 27.5 pg (ref 26.0–34.0)
MCHC: 28.9 g/dL — ABNORMAL LOW (ref 30.0–36.0)
MCV: 95.5 fL (ref 78.0–100.0)
Platelets: 451 10*3/uL — ABNORMAL HIGH (ref 150–400)
RBC: 3.74 MIL/uL — ABNORMAL LOW (ref 3.87–5.11)
RDW: 16.8 % — ABNORMAL HIGH (ref 11.5–15.5)
WBC: 15.1 10*3/uL — ABNORMAL HIGH (ref 4.0–10.5)

## 2015-08-20 LAB — TROPONIN I
Troponin I: <0.03 ng/mL (ref <0.03)
Troponin I: <0.03 ng/mL (ref <0.03)

## 2015-08-20 LAB — MRSA PCR SCREENING: MRSA by PCR: NEGATIVE

## 2015-08-20 MED ORDER — AMLODIPINE BESYLATE 5 MG PO TABS
5.0000 mg | ORAL_TABLET | Freq: Every day | ORAL | Status: DC
  Administered 2015-08-21 – 2015-08-23 (×3): 5 mg via ORAL
  Filled 2015-08-20 (×3): qty 1

## 2015-08-20 MED ORDER — LISINOPRIL 40 MG PO TABS
40.0000 mg | ORAL_TABLET | Freq: Every day | ORAL | Status: DC
  Administered 2015-08-21 – 2015-08-23 (×3): 40 mg via ORAL
  Filled 2015-08-20 (×2): qty 1
  Filled 2015-08-20: qty 2

## 2015-08-20 NOTE — Consult Note (Signed)
   [facility] CM Inpatient Consult   08/20/2015  Tiffany [name] 1960-03-30 [id number]  Notified by [place] Management Coordinator of the patient's admission.  Patient has been active with [place] Management.  Will follow the patient for progress and disposition needs.  Please contact:  Natividad Brood, RN BSN [facility] Liaison  [phone] business mobile phone Toll free office [phone]

## 2015-08-20 NOTE — Patient Outreach (Signed)
[facility]) Care Management  08/20/2015  Tiffany [name] 03/08/1960 [id number]   Noted that member was admitted to hospital for COPD exacerbation.  Hospital liaisons notified, will follow up once discharged to start transition of care program and schedule home visit.  [name], [place], MSN [place] [phone]

## 2015-08-20 NOTE — Progress Notes (Signed)
FPTS Interim Progress Note  BP 127/81   Pulse 64   Temp 98.5 F (36.9 C) (Oral)   Resp (!) 23   Ht 5\' 4"  (1.626 m)   Wt 258 lb 9.6 oz (117.3 kg)   SpO2 92%   BMI 44.39 kg/m   Patient resting peacefully.  Nocturnal Bipap in place, saturating 98%. NAD  Cardiac Panel (last 3 results)  Recent Labs  08/19/15 2221  TROPONINI <0.03   A/P: Tiffany [name] is a 55 y.o. female here for acute respiratory failure.  She was able to be weaned to 5L [place] earlier this evening and is now on nocturnal bipap.  Will plan to place on [place] again in am and see how she tolerates. Additionally, sTrop neg x1. - PCCM following, appreciate recs - Cards following, appreciate recs - Will continue to monitor patient closely  [name], DO 08/20/2015, 2:38 AM PGY-3, [place] [facility] Service pager [phone]

## 2015-08-20 NOTE — Progress Notes (Signed)
Family Medicine Teaching Service Daily Progress Note Intern Pager: [phone]  Patient name: Tiffany [name] Medical record number: [id number] Date of birth: 1961-01-16 Age: 55 y.o. Gender: female  Primary Care Provider: Kathrine Cords, MD Consultants: Cardiology, PCCM Code Status: FULL  Pt Overview and Major Events to Date:  08/19/2015: Patient admited to SDU on continuous bipap, weaned to 5L [place] with nocturnal bipap  Assessment and Plan: Tiffany [name] is a 55 y.o. female presenting with acute respiratory failure.  PMHx significant for HTN, O2-dependent COPD on 6L at home, HTN, asthma, T2DM, HLD,  bipolar 1, tobacco abuse, remote history of suspected MI, OSA and seizures.    #Acute on chronic respiratory failure with hypoxemia  - likely 2/2 COPD exacerbation vs untreated OSA vs cardiac etiology vs infectious process. Patient with history of O2-dependent COPD on 6L home O2, came into ED with Bipap requirement but was able to be weaned to 5L [place] in the evening, with nocturnal Bipap. Patient noted to desat below goal 92% overnight and so Bipap was increased to 100%O2.  AM ABG 7.25/78.7/75.8; Bipap was weaned and repeat ABG was ordered.  - continue to wean Bipap as tolerated - NPO while on Bipap - Maintain Sp02 above 92%  - follow up repeat ABG in AM - continue doxycycline 100 mg BID (day #2), Duonebs, solumedrol 60mg  (day #2)  - continue lasix IV 40mg  BID - continue home singulair, symbicort, spiriva - monitor Cr, K+, WBC - troponin - elevated in ED at 0.11, however <.03 x 2 on follow-up tests.  F/u AM troponin - follow up echo - strict I/Os - c/s Pulm: appreciate recs.  F/u LE doppler, BiPap prn and qhs - c/s cardiology: appreciate recs - smoking cessation  #elevated troponin - thought to be 2/2 demand ischemia.  No chest pain, no EKG changes.  Echo 76m ago with normal EF.  Initial troponins elevated at 0.11, improved overnight at 0.03x2.   Likely no history of CAD based on chart review,  normal cardiac cath 2013 with no stents placed.  BNP elevated at 180. - follow up AM troponin - f/u repeat echo during this admission - 40 mg IV Lasix BID  - Monitor kidney function, potassium - f/u repeat EKG in AM, monitor on tele - continue atorvastatin, ASA - follow up UDS, was recently positive for cocaine in 06/2015  #Mood disorder:  Followed by Dr. [name] - continue home ziprasidone 80 mg BID,  escitalopram 20 mg daily  #HTN- hypotensive on admission, thought suspect that this was result of poorly fitting BP cuff that kept falling off in ED. Patient now normotensive.   - holding home norvasc, verapamil and lisinopril overnight, consider resuming this AM - Lasix as above. - monitor BP  #T2DM - Well controlled. Last HbA1C 5.6 on 06/01/15. - holding home metformin - continue home gabapentin - monitor CBG's q4h - SSI  #Tobacco abuse - patient has been strongly encouraged to quit.  Continues to smoke  Daily, >20 pack year history - nicotine 21 mg patch while hospitalized - continue smoking cessation counseling  #Hx seizures and neuropathy: - Continue home gabapentin 600 mg TID    FEN/GI: NPO until off Bipap and then HH/Carb mod diet, PPI PPx: Subcutaneous heparin  Disposition: Admit to SDU for respiratory support with Bipap, transition to floor once stable off Bipap and tolerating supplemental O2.  Transition to PO as tolerated.  Anticipated hospital stay 2-3 days.  Subjective:  Patient unsure if shortness of breath has  improved this morning.  No chest pain.  Upset she cannot eat with BiPap  Objective: Temp:  [97.8 F (36.6 C)-98.5 F (36.9 C)] 97.8 F (36.6 C) (08/04 0325) Pulse Rate:  [60-78] 70 (08/04 0425) Resp:  [12-28] 19 (08/04 0425) BP: (94-161)/(49-106) 118/68 (08/04 0400) SpO2:  [84 %-100 %] 92 % (08/04 0425) FiO2 (%):  [70 %-100 %] 70 % (08/04 0425) Weight:  [117.3 kg (258 lb 9.6 oz)] 117.3 kg (258 lb 9.6 oz) (08/03 2124) Physical Exam: General:  patient in bed with bipap in place  Cardiovascular: RRR, no murmurs rubs or gallps   Respiratory: mechanical, difficult to assess but no rhonchi appreciated  Abdomen: soft, nontender, nondistended Extremities: 0-1+ edema bilaterally, tenderness to palpation bilaterally  Laboratory:  Recent Labs Lab 08/19/15 1452 08/20/15 0328  WBC 15.6* 15.1*  HGB 10.6* 10.3*  HCT 36.2 35.7*  PLT 493* 451*    Recent Labs Lab 08/19/15 1452 08/20/15 0328  NA 142 141  K 4.3 4.1  CL 102 101  CO2 28 33*  BUN 10 9  CREATININE 1.18* 0.97  CALCIUM 9.4 9.2  GLUCOSE 122* 104*     Imaging/Diagnostic Tests: Dg Chest Portable 1 View  Result Date: 08/19/2015 CLINICAL DATA:  Acute respiratory distress. Decreased oxygen saturation. EXAM: PORTABLE CHEST 1 VIEW COMPARISON:  07/29/2015 FINDINGS: The heart is enlarged but stable. Stable tortuosity and ectasia of the thoracic aorta. Asymmetric airspace process in the right lung could be asymmetric pulmonary edema or pneumonia/ aspiration. No definite pleural effusions. The bony thorax is intact. IMPRESSION: Stable cardiac enlargement. Asymmetric airspace process in the right lung could reflect asymmetric pulmonary edema or pneumonia/aspiration. Electronically Signed   By: Marijo Sanes M.D.   On: 08/19/2015 15:30     Everrett Coombe, MD 08/20/2015, 6:29 AM PGY-1, [place] Intern pager: [phone], text pages welcome

## 2015-08-20 NOTE — Progress Notes (Signed)
AM ABG results abnormal, primary MD paged and bipap settings adjusted. eLink also aware.

## 2015-08-20 NOTE — Progress Notes (Signed)
MD paged regarding elevated BP of 162/114. RN will continue to monitor

## 2015-08-20 NOTE — Progress Notes (Signed)
VASCULAR LAB PRELIMINARY  PRELIMINARY  PRELIMINARY  PRELIMINARY  Bilateral lower extremity venous duplex has been completed.     Bilateral:  No evidence of DVT, superficial thrombosis, or Baker's Cyst.  Gave the results to patient's nurse.  Nicholas Ossa, RVT, RDMS 08/20/2015, 1:53 PM

## 2015-08-20 NOTE — Progress Notes (Signed)
Subjective:  No chest pain on BiPAP  Objective:   Vital Signs : Vitals:   08/20/15 0700 08/20/15 0740 08/20/15 0750 08/20/15 0800  BP: 130/76  130/76   Pulse: 66  73 73  Resp: (!) 21  (!) 22 (!) 23  Temp:  98.3 F (36.8 C)    TempSrc:  Axillary    SpO2: (!) 89%  98% 97%  Weight:      Height:        Intake/Output from previous day:  Intake/Output Summary (Last 24 hours) at 08/20/15 0837 Last data filed at 08/20/15 0730  Gross per 24 hour  Intake                0 ml  Output              300 ml  Net             -300 ml    I/O since admission: -300  Wt Readings from Last 3 Encounters:  08/19/15 258 lb 9.6 oz (117.3 kg)  07/28/15 257 lb (116.6 kg)  07/26/15 257 lb (116.6 kg)    Medications: . aspirin EC  81 mg Oral Daily  . atorvastatin  40 mg Oral q1800  . doxycycline  100 mg Oral Q12H  . escitalopram  20 mg Oral Daily  . furosemide  40 mg Intravenous Q12H  . gabapentin  600 mg Oral TID  . heparin  5,000 Units Subcutaneous Q8H  . insulin aspart  0-15 Units Subcutaneous TID WC  . insulin aspart  0-5 Units Subcutaneous QHS  . ipratropium-albuterol  3 mL Nebulization Q6H  . methylPREDNISolone (SOLU-MEDROL) injection  60 mg Intravenous Q12H  . mometasone-formoterol  2 puff Inhalation BID  . montelukast  10 mg Oral QHS  . nicotine  21 mg Transdermal Daily  . pantoprazole  40 mg Oral Daily  . sodium chloride flush  3 mL Intravenous Q12H  . sodium chloride flush  3 mL Intravenous Q12H  . tiotropium  18 mcg Inhalation Daily  . ziprasidone  160 mg Oral QHS       Physical Exam:   General appearance: alert and no distress Neck: no adenopathy, no carotid bruit, no JVD and supple, symmetrical, trachea midline Lungs: diffuse rhochi Heart: regular rate and rhythm and 1/6 systolic murmur Abdomen: soft, non-tender; bowel sounds normal; no masses,  no organomegaly Extremities: no edema, redness or tenderness in the calves or thighs Pulses: 2+ and  symmetric Neurologic: Grossly normal   Rate: 78  Rhythm: normal sinus rhythm  ECG (independently read by me): NSR at 73; no STT changes  Lab Results:   Recent Labs  08/19/15 1452 08/20/15 0328  NA 142 141  K 4.3 4.1  CL 102 101  CO2 28 33*  GLUCOSE 122* 104*  BUN 10 9  CREATININE 1.18* 0.97  CALCIUM 9.4 9.2    Hepatic Function Latest Ref Rng & Units 07/09/2015 01/20/2015 10/20/2014  Total Protein 6.5 - 8.1 g/dL 7.3 6.7 6.3  Albumin 3.5 - 5.0 g/dL 3.9 4.0 3.7  AST 15 - 41 U/L _0 ALT 14 - 54 U/L _1 Alk Phosphatase 38 - 126 U/L 100 136(H) 138(H)  Total Bilirubin 0.3 - 1.2 mg/dL 1.5(H) 0.3 0.4     Recent Labs  08/19/15 1452 08/20/15 0328  WBC 15.6* 15.1*  HGB 10.6* 10.3*  HCT 36.2 35.7*  MCV 95.8 95.5  PLT 493* 451*  Recent Labs  08/19/15 2221 08/20/15 0328  TROPONINI <0.03 <0.03    Lab Results  Component Value Date   TSH 1.847 09/30/2012   No results for input(s): HGBA1C in the last 72 hours.  No results for input(s): PROT, ALBUMIN, AST, ALT, ALKPHOS, BILITOT, BILIDIR, IBILI in the last 72 hours. No results for input(s): INR in the last 72 hours. BNP (last 3 results)  Recent Labs  06/21/15 2301 08/19/15 1452  BNP 261.8* 180.0*    ProBNP (last 3 results) No results for input(s): PROBNP in the last 8760 hours.   Lipid Panel     Component Value Date/Time   CHOL 112 07/15/2013 1459   TRIG 115 07/15/2013 1459   HDL 35 (L) 07/15/2013 1459   CHOLHDL 3.2 07/15/2013 1459   VLDL 23 07/15/2013 1459   LDLCALC 54 07/15/2013 1459   LDLDIRECT 52 04/05/2012 1210      Imaging:  Dg Chest Portable 1 View  Result Date: 08/19/2015 CLINICAL DATA:  Acute respiratory distress. Decreased oxygen saturation. EXAM: PORTABLE CHEST 1 VIEW COMPARISON:  07/29/2015 FINDINGS: The heart is enlarged but stable. Stable tortuosity and ectasia of the thoracic aorta. Asymmetric airspace process in the right lung could be asymmetric pulmonary edema or  pneumonia/ aspiration. No definite pleural effusions. The bony thorax is intact. IMPRESSION: Stable cardiac enlargement. Asymmetric airspace process in the right lung could reflect asymmetric pulmonary edema or pneumonia/aspiration. Electronically Signed   By: Marijo Sanes M.D.   On: 08/19/2015 15:30    Assessment/Plan:   Active Problems:   Hypoxia   COPD with acute exacerbation ([place])  55 year old female with acute respiratory failure, hypoxia, elevated troponin, prior history of stroke, morbid obesity, diabetes, tobacco use, recent cocaine use.  Elevated troponin  - Point-of-care troponin minimally elevated at 0.11 in the setting of acute hypoxic respiratory failure. Most likely at this point demand ischemia.  Troponins are negative x3.  - Echocardiogram 14 months ago demonstrated EF 55-60% . Given this acute exacerbation and possibility of pulmonary edema and mildly elevated BNP of 180 suggestive of mild diastolic CHF.  For repeat echo today.   - Had a prior cath in [place], no PCI ?   Hypoxic respiratory failure, acute on chronic  - Likely respiratory driving current illness  - IV Lasix not unreasonable to continue as a component of acute diastolic heart failure may be playing a role given her baseline morbid obesity. Consider a dosage such as 40 mg IV twice a day. Monitor potassium closely. Monitor creatinine closely.  Hyperlipidemia - on atorvastatin 40 mg; LDL 54 in 2015 will re-check fasting lipids.  History of cocaine use  - In June 2017 urine was positive for cocaine.  - Encourage continued cessation.  Bipolar disorder  - Per primary team.  Tobacco use  - She states her last cigarette was prior to admission. Discussed importance of smoking cessation and dager with home supplemental oxygen therapy.   Morbid obesity  - Continue to encourage weight loss.   Troy Sine, MD, [facility] 08/20/2015, 8:37 AM

## 2015-08-20 NOTE — Progress Notes (Signed)
Changes made to BIPAP per MD call from [facility]. Increase IPAP from 16 to 20. Pt is now obtaining bigger tidal volumes. RT will continue to monitor.

## 2015-08-20 NOTE — Progress Notes (Signed)
Echocardiogram 2D Echocardiogram has been performed.  Tiffany [name] 08/20/2015, 10:49 AM

## 2015-08-20 NOTE — Significant Event (Signed)
1000am-patient refusing to wear Bipap. She stated she is tired of it and wants to eat.    RN placed patient on HFNC initially at 6L [place], where patient's saturations were low 80s. Increased HFNC up to 8L-saturations still marginal 84-86%. Patient taught to use incentive spirometer, purse-lip breathing, sitting up in bed, and relaxation.  Patient is not asymtomatic at all with saturations in the mid 80s, patient is talking with daughter, talking on the telephone and making appointments, eating. She refuses to wear Bipap and face mask when offered.

## 2015-08-20 NOTE — Progress Notes (Addendum)
Name: Tiffany [name] MRN: [id number] DOB: 1960/01/19    ADMISSION DATE:  08/19/2015 CONSULTATION DATE:  8/3  REFERRING MD :  Dr. Carlisle Cater  CHIEF COMPLAINT:  SOB  HISTORY OF PRESENT ILLNESS:  55 year old female with PMH as below, which includes Asthma/COPD on 6L [place] at home (no PFT available, has seen RB in past), DM, HTN, OSA on CPAP (has not been wearing since starting home O2), Seizures, and CVA. She was recently admitted for what was described as a pretty severe CAP requiring ICU monitoring and BiPAP. She was initially very hypoxemic, however, her respiratory distress was relatively minimal. She left from that admission AMA and was started on home O2, which she now wears at 6 liters "as needed" which has been pretty much constantly the past few weeks. She has had worsening SOB since 8/2 and minimally productive cough with grey sputum. Denies fever and chills. Family reported a little bit of confusion 8/3 and she presented to ED where she was found to be hypoxic and hypercapnic. She was started on BiPAP and planned for admission to [place]. PCCM consulted.   SIGNIFICANT EVENTS  6/5 admit for CAP, discharged on home O2 6/23 admit for suicidal ideation 8/3 admit for COPD/CHF  STUDIES:    PAST MEDICAL HISTORY :   has a past medical history of Anxiety; Asthma; Complication of anesthesia; Diabetes mellitus; Disorder of nervous system; Emphysema; Heart attack ([place]); High blood pressure; High cholesterol; Incontinence; Manic depression ([place]); Myocardial infarct ([place]); Pneumonia (06/2015); Seasonal allergies; Seizures ([place]); Sinus trouble; Sleep apnea; and Stroke ([place]).  has a past surgical history that includes Abdominal hysterectomy; Cesarean section; and Hiatal hernia repair. Prior to Admission medications   Medication Sig Start Date End Date Taking? Authorizing Provider  ACCU-CHEK SOFTCLIX LANCETS lancets  07/28/14  Yes Historical Provider, MD  albuterol (PROVENTIL HFA;VENTOLIN HFA) 108 (90  Base) MCG/ACT inhaler Inhale 2 puffs into the lungs every 6 (six) hours as needed. Patient taking differently: Inhale 2 puffs into the lungs every 6 (six) hours as needed for wheezing or shortness of breath.  07/26/15  Yes Archie Patten, MD  albuterol (PROVENTIL) (2.5 MG/3ML) 0.083% nebulizer solution Take 3 mLs (2.5 mg total) by nebulization every 6 (six) hours as needed for wheezing or shortness of breath. 10/20/14  Yes Archie Patten, MD  amLODipine (NORVASC) 5 MG tablet Take 2 tablets (10 mg total) by mouth daily. 06/01/15  Yes Archie Patten, MD  aspirin 81 MG EC tablet TAKE 1 TABLET (81 MG TOTAL) BY MOUTH DAILY. Patient taking differently: TAKE 1 TABLET (81 MG TOTAL) BY MOUTH every morning 08/03/14  Yes Crystal Libby Maw, MD  atorvastatin (LIPITOR) 40 MG tablet Take 1 tablet (40 mg total) by mouth daily. 07/26/15  Yes Archie Patten, MD  Blood Glucose Monitoring Suppl (ACCU-CHEK AVIVA PLUS) w/Device KIT TEST FOUR TIMES DAILY 07/27/15  Yes Archie Patten, MD  escitalopram (LEXAPRO) 20 MG tablet Take 1 tablet (20 mg total) by mouth daily. 07/16/15  Yes Kathlee Nations, MD  fluticasone furoate-vilanterol (BREO ELLIPTA) 100-25 MCG/INH AEPB Inhale 1 puff into the lungs daily. 07/26/15  Yes Archie Patten, MD  furosemide (LASIX) 40 MG tablet Take 1 tablet (40 mg total) by mouth daily. Take Lasix twice daily from 7/14 until 7/18, then daily Patient taking differently: Take 40 mg by mouth daily.  07/29/15  Yes Archie Patten, MD  gabapentin (NEURONTIN) 600 MG tablet Take 1 tablet (600 mg total)  by mouth 3 (three) times daily. 07/26/15  Yes Archie Patten, MD  glucose blood test strip Use to check blood sugar 3 times daily. 07/05/15  Yes Archie Patten, MD  lisinopril (PRINIVIL,ZESTRIL) 40 MG tablet Take 1 tablet (40 mg total) by mouth daily. 07/26/15  Yes Archie Patten, MD  loratadine (CLARITIN) 10 MG tablet Take 1 tablet (10 mg total) by mouth daily. 07/26/15  Yes Archie Patten, MD  metFORMIN  (GLUCOPHAGE-XR) 500 MG 24 hr tablet Take 1 tablet (500 mg total) by mouth 2 (two) times daily. 07/26/15  Yes Archie Patten, MD  mirtazapine (REMERON) 30 MG tablet Take 1 tablet (30 mg total) by mouth at bedtime. 07/16/15  Yes Kathlee Nations, MD  montelukast (SINGULAIR) 10 MG tablet Take 1 tablet (10 mg total) by mouth at bedtime. 07/26/15  Yes Archie Patten, MD  Multiple Vitamins-Minerals (MULTIVITAMIN WITH MINERALS) tablet Take 1 tablet by mouth daily. Reported on 07/28/2015   Yes Historical Provider, MD  naproxen (NAPROSYN) 500 MG tablet Take 1 tablet (500 mg total) by mouth 3 (three) times daily as needed. Patient taking differently: Take 500 mg by mouth 3 (three) times daily as needed for mild pain.  07/26/15  Yes Archie Patten, MD  omeprazole (PRILOSEC) 40 MG capsule Take 1 capsule (40 mg total) by mouth daily. 07/01/15  Yes Archie Patten, MD  oxybutynin (DITROPAN) 5 MG tablet Take 1 tablet (5 mg total) by mouth 3 (three) times daily. 04/15/15  Yes Archie Patten, MD  potassium chloride (K-DUR,KLOR-CON) 10 MEQ tablet Take 2 tablets (20 mEq total) by mouth daily. 07/26/15  Yes Archie Patten, MD  SYMBICORT 160-4.5 MCG/ACT inhaler Inhale 2 puffs into the lungs 2 (two) times daily.  07/06/15  Yes Historical Provider, MD  tiotropium (SPIRIVA HANDIHALER) 18 MCG inhalation capsule Place 1 capsule (18 mcg total) into inhaler and inhale daily. 07/26/15  Yes Archie Patten, MD  verapamil (VERELAN PM) 240 MG 24 hr capsule Take 2 capsules (480 mg total) by mouth at bedtime. 07/26/15  Yes Archie Patten, MD  ziprasidone (GEODON) 80 MG capsule Take 1 capsule (80 mg total) by mouth 2 (two) times daily with a meal. Patient taking differently: Take 160 mg by mouth at bedtime.  07/16/15  Yes Kathlee Nations, MD   Allergies  Allergen Reactions  . Hydroxyzine Other (See Comments)    unknown  . Codeine Nausea And Vomiting  . Sulfa Antibiotics Itching    FAMILY HISTORY:  family history includes Anxiety  disorder in her sister; Bipolar disorder in her sister; Cancer in her father and mother; Depression in her brother, mother, sister, and sister; Schizophrenia in her sister. SOCIAL HISTORY:  reports that she has been smoking Cigarettes.  She started smoking about 38 years ago. She has a 18.50 pack-year smoking history. She has never used smokeless tobacco. She reports that she does not drink alcohol or use drugs.  REVIEW OF SYSTEMS:   Bolds are positive  Constitutional: weight loss, gain, night sweats, Fevers, chills, fatigue .  HEENT: headaches, Sore throat, sneezing, nasal congestion, post nasal drip, Difficulty swallowing, Tooth/dental problems, visual complaints visual changes, ear ache CV:  chest pain, radiates: ,Orthopnea, PND, swelling in lower extremities, dizziness, palpitations, syncope.  GI  heartburn, indigestion, abdominal pain, nausea, vomiting, diarrhea, change in bowel habits, loss of appetite, bloody stools.  Resp: cough, productive: , hemoptysis, dyspnea, chest pain, pleuritic.  Skin: rash or itching or icterus GU:  dysuria, change in color of urine, urgency or frequency. flank pain, hematuria  MS: joint pain or swelling. decreased range of motion  Psych: change in mood or affect. depression or anxiety.  Neuro: difficulty with speech, weakness, numbness, ataxia    SUBJECTIVE:   VITAL SIGNS: Temp:  [97.8 F (36.6 C)-98.5 F (36.9 C)] 98.3 F (36.8 C) (08/04 0740) Pulse Rate:  [60-78] 73 (08/04 0900) Resp:  [12-28] 23 (08/04 0900) BP: (94-161)/(49-106) 130/76 (08/04 0750) SpO2:  [84 %-100 %] 90 % (08/04 0945) FiO2 (%):  [70 %-100 %] 70 % (08/04 0900) Weight:  [258 lb 9.6 oz (117.3 kg)] 258 lb 9.6 oz (117.3 kg) (08/03 2124)  PHYSICAL EXAMINATION: General:  Off bipap, obese, no distress Neuro:  Alert, oriented, non-focal HEENT:  Moist mucus membranes, no appreciable JVD Cardiovascular:  RRR, no MRG Lungs:  Clear, no wheeze or crackles Abdomen:  Soft, non-tender,  non-distended Musculoskeletal:  No acute deformity or ROM limitation. L>R BLE edema Skin:  Intact, no rash.   Recent Labs Lab 08/19/15 1452 08/20/15 0328  NA 142 141  K 4.3 4.1  CL 102 101  CO2 28 33*  BUN 10 9  CREATININE 1.18* 0.97  GLUCOSE 122* 104*    Recent Labs Lab 08/19/15 1452 08/20/15 0328  HGB 10.6* 10.3*  HCT 36.2 35.7*  WBC 15.6* 15.1*  PLT 493* 451*   Dg Chest Portable 1 View  Result Date: 08/19/2015 CLINICAL DATA:  Acute respiratory distress. Decreased oxygen saturation. EXAM: PORTABLE CHEST 1 VIEW COMPARISON:  07/29/2015 FINDINGS: The heart is enlarged but stable. Stable tortuosity and ectasia of the thoracic aorta. Asymmetric airspace process in the right lung could be asymmetric pulmonary edema or pneumonia/ aspiration. No definite pleural effusions. The bony thorax is intact. IMPRESSION: Stable cardiac enlargement. Asymmetric airspace process in the right lung could reflect asymmetric pulmonary edema or pneumonia/aspiration. Electronically Signed   By: Marijo Sanes M.D.   On: 08/19/2015 15:30    ASSESSMENT / PLAN: Acute on chronic mixed respiratory failure: Multifactorial in the setting of suspected COPD exacerbation and decompensated OSA/OHS. Also concern for some volume overload/pulmonary edema. [name] an active smoker Resp status is slightly better today AM after Bipap and diuresis. [name] non compliant at home with her therapy and is upset that she did not get anything to eat while on Bipap.  Reccs: - BiPAP PRN and mandatory at night - Duoneb q 6 hours and PRN albuterol. - Solumedrol IV '60mg'$  q 12 hours. Can switch to PO tomorrow - Incentive spirometry and flutter valve - Diuresis as tolerated - Doxycycline for 5 day course - Follow LE dopplers to R/O DVT - Smoking cessation and follow up in pulmonary clinic for PFTs and sleep study.  Marshell Garfinkel MD [name] Pager [phone] If no answer or after 3pm call:  [phone] 08/20/2015, 11:32 AM

## 2015-08-21 DIAGNOSIS — I5033 Acute on chronic diastolic (congestive) heart failure: Secondary | ICD-10-CM

## 2015-08-21 DIAGNOSIS — I5032 Chronic diastolic (congestive) heart failure: Secondary | ICD-10-CM

## 2015-08-21 LAB — CBC
HCT: 34 % — ABNORMAL LOW (ref 36.0–46.0)
Hemoglobin: 10.1 g/dL — ABNORMAL LOW (ref 12.0–15.0)
MCH: 27.9 pg (ref 26.0–34.0)
MCHC: 29.7 g/dL — ABNORMAL LOW (ref 30.0–36.0)
MCV: 93.9 fL (ref 78.0–100.0)
Platelets: 463 10*3/uL — ABNORMAL HIGH (ref 150–400)
RBC: 3.62 MIL/uL — ABNORMAL LOW (ref 3.87–5.11)
RDW: 16.7 % — ABNORMAL HIGH (ref 11.5–15.5)
WBC: 13.8 10*3/uL — ABNORMAL HIGH (ref 4.0–10.5)

## 2015-08-21 LAB — BASIC METABOLIC PANEL
Anion gap: 11 (ref 5–15)
BUN: 17 mg/dL (ref 6–20)
CO2: 30 mmol/L (ref 22–32)
Calcium: 9.2 mg/dL (ref 8.9–10.3)
Chloride: 99 mmol/L — ABNORMAL LOW (ref 101–111)
Creatinine, Ser: 0.98 mg/dL (ref 0.44–1.00)
GFR calc Af Amer: >60 mL/min (ref >60)
GFR calc non Af Amer: >60 mL/min (ref >60)
Glucose, Bld: 131 mg/dL — ABNORMAL HIGH (ref 65–99)
Potassium: 3.9 mmol/L (ref 3.5–5.1)
Sodium: 140 mmol/L (ref 135–145)

## 2015-08-21 LAB — GLUCOSE, CAPILLARY
Glucose-Capillary: 130 mg/dL — ABNORMAL HIGH (ref 65–99)
Glucose-Capillary: 137 mg/dL — ABNORMAL HIGH (ref 65–99)
Glucose-Capillary: 120 mg/dL — ABNORMAL HIGH (ref 65–99)
Glucose-Capillary: 137 mg/dL — ABNORMAL HIGH (ref 65–99)

## 2015-08-21 MED ORDER — GI COCKTAIL ~~LOC~~
30.0000 mL | Freq: Once | ORAL | Status: AC
  Administered 2015-08-21: 30 mL via ORAL
  Filled 2015-08-21: qty 30

## 2015-08-21 MED ORDER — PREDNISONE 20 MG PO TABS
40.0000 mg | ORAL_TABLET | Freq: Every day | ORAL | Status: DC
  Administered 2015-08-22 – 2015-08-23 (×2): 40 mg via ORAL
  Filled 2015-08-21 (×2): qty 2

## 2015-08-21 MED ORDER — FUROSEMIDE 40 MG PO TABS
40.0000 mg | ORAL_TABLET | Freq: Every day | ORAL | Status: DC
  Administered 2015-08-21 – 2015-08-23 (×3): 40 mg via ORAL
  Filled 2015-08-21 (×3): qty 1

## 2015-08-21 NOTE — Progress Notes (Addendum)
Family Medicine Teaching Service Daily Progress Note Intern Pager: [phone]  Patient name: Tiffany [name] Medical record number: [id number] Date of birth: May 17, 1960 Age: 55 y.o. Gender: female  Primary Care Provider: Kathrine Cords, MD Consultants:  PCCM   Code Status: FULL  Pt Overview and Major Events to Date:  08/19/2015 Pt admitted to SDU on continuous bipap, weaned to 5L [place] with nocturnal bipap  Assessment and Plan: Seana Stelma McElrathis a 55 y.o.femalepresenting with acute respiratory failure. PMHx significant for HTN, O2-dependent COPD on 6L at home,HTN, asthma, T2DM, HLD, bipolar 1, tobacco abuse, remote history of suspected MI, OSA and seizures.   #Acute on chronic respiratory failure with hypoxemia - thought to be secondary to COPD exacerbation with CHF and OSA components. Patient refusing bipap overnight, however O2 saturation in upper 90's on home 6L by [place].  - NPO while on bipap, maintain Sp02>92% - Transition to PO doxycycline 100 mg BID (day #3/5)  - 60 mg solumedrol BID (Day#2) - consider transitioning to PO/taper today - IV Lasix 40mg  BID, consider transitioning to PO today - continue home asthma regimen plus scheduled duonebs - troponins improved, now negative x3 - strict I&Os - smoking cessation - c/s Pulm: appreciate recs. BiPap prn and qhs, plan for PCCM follow up at discharge    #Cough productive of bloody sputum, new: Concerning for new infection vs. more serious etiology such as malignancy. Patient reports onset of cough this morning, no fevers, decreased white count.  - patient currently treated with doxycycline (as above)  - will speak with pulm regarding need for repeat CXR vs chest CT    #elevated troponin- Resolved. Thought to be 2/2 demand ischemia.  No chest pain, no EKG changes.  Echo 39m ago with normal EF.  Initial troponins elevated at 0.11, improved overnight at 0.03x2.  BNP elevated at 180. - Cardiac Echo (08/20/2015) - EF 55-60%, G1DD, moderate  pulmonary hypertension - Monitor kidney function, potassium - continue atorvastatin, ASA   #HTN - stable.  Patient now normotensive.  - resumed home norvasc, and lisinopril - Lasix as above. - monitor BP  #Mood disorder - stable. Followed by Dr. [name] - continue home ziprasidone 80 mg BID, escitalopram 20 mg daily  #T2DM - Well controlled. Last HbA1C 5.6 on 06/01/15. - holding home metformin - continue home gabapentin - monitor CBG's q4h - SSI  #Tobacco abuse - patient has been strongly encouraged to quit. Continues to smoke Daily, >20 pack year history - nicotine 21 mg patch while hospitalized - continue smoking cessation counseling  #Hx seizures and neuropathy: - Continue home gabapentin 600 mg TID   FEN/GI: NPO until Bipap and then HH/Carb mod diet, PPI PPx: Subcutaneous heparin  Disposition: Transfer to med/surg   Subjective:  Patient feeling breathing is better this morning.  Objective: Temp:  [98 F (36.7 C)-98.9 F (37.2 C)] 98.9 F (37.2 C) (08/05 0422) Pulse Rate:  [61-93] 61 (08/05 0422) Resp:  [19-29] 22 (08/05 0422) BP: (130-162)/(67-114) 136/67 (08/05 0422) SpO2:  [82 %-99 %] 97 % (08/05 0422) FiO2 (%):  [50 %-70 %] 50 % (08/05 0422) Physical Exam: General: NAD, rests comfortably in bed Cardiovascular: RRR, no murmurs rubs or gallops Respiratory: diffuse wheezes, diffuse rhonchi Abdomen: soft, nontender, nondistended Extremities: full ROM, 1+ edema lower extremities bilaterally  Laboratory:  Recent Labs Lab 08/19/15 1452 08/20/15 0328 08/21/15 0341  WBC 15.6* 15.1* 13.8*  HGB 10.6* 10.3* 10.1*  HCT 36.2 35.7* 34.0*  PLT 493* 451* 463*  Recent Labs Lab 08/19/15 1452 08/20/15 0328 08/21/15 0341  NA 142 141 140  K 4.3 4.1 3.9  CL 102 101 99*  CO2 28 33* 30  BUN 10 9 17   CREATININE 1.18* 0.97 0.98  CALCIUM 9.4 9.2 9.2  GLUCOSE 122* 104* 131*      Imaging/Diagnostic Tests: Cardiac Echo 08/21/2015 STUDY CONCLUSIONS: -  Left ventricle: The cavity size was normal. Wall thickness was   increased in a pattern of mild LVH. Systolic function was normal.   The estimated ejection fraction was in the range of 55% to 60%.   Wall motion was normal; there were no regional wall motion   abnormalities. Doppler parameters are consistent with abnormal   left ventricular relaxation (grade 1 diastolic dysfunction). The   E/e&' ratio is between 8-15, suggesting indeterminate LV filling   pressure. - Mitral valve: Calcified annulus. Mildly thickened leaflets .   There was trivial regurgitation. - Left atrium: The atrium was mildly dilated. - Right ventricle: Poorly visualized. - Right atrium: Moderately dilated. - Tricuspid valve: There was mild regurgitation. - Pulmonary arteries: PA peak pressure: 49 mm Hg (S). - Inferior vena cava: The vessel was dilated. The respirophasic   diameter changes were blunted (< 50%), consistent with elevated   central venous pressure. IMPRESSIONS: - Compared to a prior echo in 06/2014, there are few changes. The   RVSP is elevated to 49 mmHg in this study, suggestive of moderate   pulmonary hypertension. There is moderate RAE, but the RV was not   well-visualized and may be dilated.  Everrett Coombe, MD 08/21/2015, 7:14 AM PGY-1, [place] Intern pager: [phone], text pages welcome

## 2015-08-21 NOTE — Progress Notes (Signed)
Subjective:   Denies CP  Echo EF 55-60% No wall motion abn  Objective:   Vital Signs : Vitals:   08/21/15 0824 08/21/15 0826 08/21/15 1129 08/21/15 1142  BP:    (!) 159/88  Pulse:  78 89 79  Resp:  19 (!) 27 19  Temp:    98.7 F (37.1 C)  TempSrc:    Oral  SpO2: 94% 98% 95% 98%  Weight:      Height:        Intake/Output from previous day:  Intake/Output Summary (Last 24 hours) at 08/21/15 1235 Last data filed at 08/21/15 1106  Gross per 24 hour  Intake              360 ml  Output             3200 ml  Net            -2840 ml    I/O since admission: -300  Wt Readings from Last 3 Encounters:  08/19/15 117.3 kg (258 lb 9.6 oz)  07/28/15 116.6 kg (257 lb)  07/26/15 116.6 kg (257 lb)    Medications: . amLODipine  5 mg Oral Daily  . aspirin EC  81 mg Oral Daily  . atorvastatin  40 mg Oral q1800  . doxycycline  100 mg Oral Q12H  . escitalopram  20 mg Oral Daily  . furosemide  40 mg Intravenous Q12H  . gabapentin  600 mg Oral TID  . heparin  5,000 Units Subcutaneous Q8H  . insulin aspart  0-15 Units Subcutaneous TID WC  . insulin aspart  0-5 Units Subcutaneous QHS  . ipratropium-albuterol  3 mL Nebulization Q6H  . lisinopril  40 mg Oral Daily  . methylPREDNISolone (SOLU-MEDROL) injection  60 mg Intravenous Q12H  . mometasone-formoterol  2 puff Inhalation BID  . montelukast  10 mg Oral QHS  . nicotine  21 mg Transdermal Daily  . pantoprazole  40 mg Oral Daily  . sodium chloride flush  3 mL Intravenous Q12H  . sodium chloride flush  3 mL Intravenous Q12H  . tiotropium  18 mcg Inhalation Daily  . ziprasidone  160 mg Oral QHS       Physical Exam:   General appearance: alert and no distress Neck: no adenopathy, no carotid bruit, no JVD and supple, symmetrical, trachea midline Lungs: diffuse rhochi. No wheezing  Heart: regular rate and rhythm and 1/6 systolic murmur Abdomen: soft, non-tender; bowel sounds normal; no masses,  no organomegaly Extremities:  no edema, redness or tenderness in the calves or thighs Pulses: 2+ and symmetric Neurologic: Grossly normal   Rate: 78  Rhythm: normal sinus rhythm  ECG (independently read by me): NSR at 73; no STT changes  Lab Results:   Recent Labs  08/19/15 1452 08/20/15 0328 08/21/15 0341  NA 142 141 140  K 4.3 4.1 3.9  CL 102 101 99*  CO2 28 33* 30  GLUCOSE 122* 104* 131*  BUN '10 9 17  ' CREATININE 1.18* 0.97 0.98  CALCIUM 9.4 9.2 9.2    Hepatic Function Latest Ref Rng & Units 07/09/2015 01/20/2015 10/20/2014  Total Protein 6.5 - 8.1 g/dL 7.3 6.7 6.3  Albumin 3.5 - 5.0 g/dL 3.9 4.0 3.7  AST 15 - 41 U/L '15 17 18  ' ALT 14 - 54 U/L '15 16 17  ' Alk Phosphatase 38 - 126 U/L 100 136(H) 138(H)  Total Bilirubin 0.3 - 1.2 mg/dL 1.5(H) 0.3 0.4     Recent Labs  08/19/15 1452 08/20/15 0328 08/21/15 0341  WBC 15.6* 15.1* 13.8*  HGB 10.6* 10.3* 10.1*  HCT 36.2 35.7* 34.0*  MCV 95.8 95.5 93.9  PLT 493* 451* 463*     Recent Labs  08/19/15 2221 08/20/15 0328 08/20/15 1004  TROPONINI <0.03 <0.03 <0.03    Lab Results  Component Value Date   TSH 1.847 09/30/2012   No results for input(s): HGBA1C in the last 72 hours.  No results for input(s): PROT, ALBUMIN, AST, ALT, ALKPHOS, BILITOT, BILIDIR, IBILI in the last 72 hours. No results for input(s): INR in the last 72 hours. BNP (last 3 results)  Recent Labs  06/21/15 2301 08/19/15 1452  BNP 261.8* 180.0*    ProBNP (last 3 results) No results for input(s): PROBNP in the last 8760 hours.   Lipid Panel     Component Value Date/Time   CHOL 112 07/15/2013 1459   TRIG 115 07/15/2013 1459   HDL 35 (L) 07/15/2013 1459   CHOLHDL 3.2 07/15/2013 1459   VLDL 23 07/15/2013 1459   LDLCALC 54 07/15/2013 1459   LDLDIRECT 52 04/05/2012 1210      Imaging:  Dg Chest Portable 1 View  Result Date: 08/19/2015 CLINICAL DATA:  Acute respiratory distress. Decreased oxygen saturation. EXAM: PORTABLE CHEST 1 VIEW COMPARISON:  07/29/2015  FINDINGS: The heart is enlarged but stable. Stable tortuosity and ectasia of the thoracic aorta. Asymmetric airspace process in the right lung could be asymmetric pulmonary edema or pneumonia/ aspiration. No definite pleural effusions. The bony thorax is intact. IMPRESSION: Stable cardiac enlargement. Asymmetric airspace process in the right lung could reflect asymmetric pulmonary edema or pneumonia/aspiration. Electronically Signed   By: Marijo Sanes M.D.   On: 08/19/2015 15:30    Assessment/Plan:   Active Problems:   Hypoxia   COPD with acute exacerbation (HCC)   SOB (shortness of breath)   Hyperlipidemia  56 year old female with acute respiratory failure, hypoxia, elevated troponin, prior history of stroke, morbid obesity, diabetes, tobacco use, recent cocaine use.  Elevated troponin  - Point-of-care troponin minimally elevated at 0.11 in the setting of acute hypoxic respiratory failure. Most likely at this point demand ischemia.  Troponins are negative x3.  - Echocardiogram reviewed personally  And EF 55-60%. No wall motion abnormalities  Given this acute exacerbation and possibility of pulmonary edema and mildly elevated BNP of 180 suggestive of mild diastolic CHF.   - Suspect COPD exacerbation with possible mild diastolic HF in setting of HTN. Improved with IV lasix. Ca resume home lasix. Consider adding spiro as needed for better BP and volume control   - No further cardiac testing indicated.   Hypoxic respiratory failure, acute on chronic  - Likely respiratory driving current illness  - + component of diastolic HF. Improved with lasix.   Hyperlipidemia - on atorvastatin 40 mg;   History of cocaine use  - In June 2017 urine was positive for cocaine.  - Encourage continued cessation.  Bipolar disorder  - Per primary team.  Tobacco use  - She states her last cigarette was prior to admission. Discussed importance of smoking cessation and danger with home supplemental  oxygen therapy.   Morbid obesity  - Continue to encourage weight loss.  We will sign off. Please call with questions.   Breccan Galant,MD 12:42 PM

## 2015-08-21 NOTE — Progress Notes (Signed)
Pt refusing BiPAP at this time. Pt is sitting up eating with no distress noted. I told her to call Iif she wants to wear it later. RT will continue to monitor

## 2015-08-22 ENCOUNTER — Inpatient Hospital Stay (HOSPITAL_COMMUNITY): Payer: Commercial Managed Care - HMO

## 2015-08-22 ENCOUNTER — Encounter (HOSPITAL_COMMUNITY): Payer: Self-pay | Admitting: Radiology

## 2015-08-22 DIAGNOSIS — J449 Chronic obstructive pulmonary disease, unspecified: Secondary | ICD-10-CM

## 2015-08-22 DIAGNOSIS — R042 Hemoptysis: Secondary | ICD-10-CM

## 2015-08-22 DIAGNOSIS — G4733 Obstructive sleep apnea (adult) (pediatric): Secondary | ICD-10-CM

## 2015-08-22 LAB — BASIC METABOLIC PANEL
Anion gap: 6 (ref 5–15)
BUN: 14 mg/dL (ref 6–20)
CO2: 38 mmol/L — ABNORMAL HIGH (ref 22–32)
Calcium: 9.1 mg/dL (ref 8.9–10.3)
Chloride: 98 mmol/L — ABNORMAL LOW (ref 101–111)
Creatinine, Ser: 0.91 mg/dL (ref 0.44–1.00)
GFR calc Af Amer: >60 mL/min (ref >60)
GFR calc non Af Amer: >60 mL/min (ref >60)
Glucose, Bld: 88 mg/dL (ref 65–99)
Potassium: 3.2 mmol/L — ABNORMAL LOW (ref 3.5–5.1)
Sodium: 142 mmol/L (ref 135–145)

## 2015-08-22 LAB — GLUCOSE, CAPILLARY
Glucose-Capillary: 93 mg/dL (ref 65–99)
Glucose-Capillary: 119 mg/dL — ABNORMAL HIGH (ref 65–99)
Glucose-Capillary: 139 mg/dL — ABNORMAL HIGH (ref 65–99)
Glucose-Capillary: 191 mg/dL — ABNORMAL HIGH (ref 65–99)

## 2015-08-22 LAB — CBC
HCT: 34.1 % — ABNORMAL LOW (ref 36.0–46.0)
Hemoglobin: 10.1 g/dL — ABNORMAL LOW (ref 12.0–15.0)
MCH: 27.6 pg (ref 26.0–34.0)
MCHC: 29.6 g/dL — ABNORMAL LOW (ref 30.0–36.0)
MCV: 93.2 fL (ref 78.0–100.0)
Platelets: 500 10*3/uL — ABNORMAL HIGH (ref 150–400)
RBC: 3.66 MIL/uL — ABNORMAL LOW (ref 3.87–5.11)
RDW: 16.8 % — ABNORMAL HIGH (ref 11.5–15.5)
WBC: 12.3 10*3/uL — ABNORMAL HIGH (ref 4.0–10.5)

## 2015-08-22 MED ORDER — POTASSIUM CHLORIDE CRYS ER 20 MEQ PO TBCR
40.0000 meq | EXTENDED_RELEASE_TABLET | Freq: Once | ORAL | Status: AC
  Administered 2015-08-22: 40 meq via ORAL
  Filled 2015-08-22: qty 2

## 2015-08-22 MED ORDER — POTASSIUM CHLORIDE CRYS ER 20 MEQ PO TBCR
20.0000 meq | EXTENDED_RELEASE_TABLET | Freq: Once | ORAL | Status: AC
  Administered 2015-08-22: 20 meq via ORAL
  Filled 2015-08-22: qty 1

## 2015-08-22 MED ORDER — GUAIFENESIN ER 600 MG PO TB12
600.0000 mg | ORAL_TABLET | Freq: Two times a day (BID) | ORAL | Status: DC | PRN
  Administered 2015-08-22: 600 mg via ORAL
  Filled 2015-08-22: qty 1

## 2015-08-22 MED ORDER — INSULIN ASPART 100 UNIT/ML ~~LOC~~ SOLN
1.0000 [IU] | Freq: Once | SUBCUTANEOUS | Status: AC
  Administered 2015-08-22: 1 [IU] via SUBCUTANEOUS

## 2015-08-22 MED ORDER — IOPAMIDOL (ISOVUE-300) INJECTION 61%
INTRAVENOUS | Status: AC
  Administered 2015-08-22: 75 mL
  Filled 2015-08-22: qty 75

## 2015-08-22 MED ORDER — NICOTINE POLACRILEX 2 MG MT GUM: 2.0000 mg | CHEWING_GUM | OROMUCOSAL | 0 refills | Status: DC | PRN

## 2015-08-22 NOTE — Progress Notes (Signed)
Pt c/o indigestion and heartburn to epigastric area and mid chest, denies chest pain and pressure, no acute respiratory distress present. MD notified and order for Maalox placed. Maalox effective.

## 2015-08-22 NOTE — Progress Notes (Signed)
Family Medicine Teaching Service Daily Progress Note Intern Pager: [phone]  Patient name: Tiffany [name] Medical record number: [id number] Date of birth: 1960/12/19 Age: 55 y.o. Gender: female  Primary Care Provider: Kathrine Cords, MD Consultants:  PCCM   Code Status: FULL  Pt Overview and Major Events to Date:  08/19/2015 Pt admitted to SDU on continuous bipap, weaned to 5L [place] with nocturnal bipap 08/21/15: transitioned to PO Lasix and PO steroid; transferred to floor   Assessment and Plan: Shaely Proper McElrathis a 55 y.o.femalepresenting with acute respiratory failure. PMHx significant for HTN, O2-dependent COPD on 6L at home,HTN, asthma, T2DM, HLD, bipolar 1, tobacco abuse, remote history of suspected MI, OSA, seizures, cocaine use.   #Acute on chronic respiratory failure with hypoxemia - thought to be secondary to COPD exacerbation with CHF and OSA components. Bbipap overnight, however O2 saturation in upper 90's on home 6L by [place].  - Doxycycline 100 mg BID (day #3/5)  - PO Prednisone 40mg  daily (steroids day 3/5) - PO Lasix 40mg  daily - continue home asthma regimen (Dulera BID, Singulair, Spiriva) plus scheduled duonebs - troponins improved, now negative x3 - strict I&Os: 1.4 L out over 24 hours; net -3.675 L since admission - smoking cessation - c/s Pulm: appreciate recs. BiPap prn and qhs, plan for PCCM follow up at discharge - will discuss with CM about bipap with O2 for home   #Cough productive of bloody sputum, none today: New infection vs. more serious etiology such as malignancy. Patient reports onset of cough this morning, no fevers, decreased white count.  - patient currently treated with doxycycline (as above)  - discussed with pulm who recommends CT chest with contrast   #Elevated troponin- Resolved. Thought to be 2/2 demand ischemia.  No chest pain, no EKG changes.   - Monitor kidney function, potassium - continue atorvastatin, ASA  #HTN - stable.  Mildly  elevated this AM at 156/84 - resumed home norvasc, and lisinopril - Lasix as above - home verapamil held on admit  #Mood disorder - stable. Followed by Dr. [name] - continue home ziprasidone 80 mg BID, escitalopram 20 mg daily  #T2DM - Well controlled. Last HbA1C 5.6 on 06/01/15. - holding home metformin - continue home gabapentin - monitor CBG's q4h - SSI  #Tobacco abuse - patient has been strongly encouraged to quit. Continues to smoke Daily, >20 pack year history - nicotine 21 mg patch while hospitalized - continue smoking cessation counseling  #Hx seizures and neuropathy: - Continue home gabapentin 600 mg TID  FEN/GI: HH/Carb mod diet, PPI PPx: Subcutaneous heparin  Disposition: likely discharge today   Subjective:  Patient feeling breathing is better this morning; breathing is at baseline. Reports no blood in sputum today. Reports of blood in sputum intermittently for the past few months; reports usually occurs "when she is smoking". Reports she has a bipap at home but it is not one that can be used with oxygen.    Objective: Temp:  [97.4 F (36.3 C)-99.2 F (37.3 C)] 97.4 F (36.3 C) (08/06 0600) Pulse Rate:  [57-89] 57 (08/06 0600) Resp:  [17-27] 18 (08/06 0600) BP: (100-159)/(52-88) 156/84 (08/06 0600) SpO2:  [92 %-99 %] 94 % (08/06 0611) FiO2 (%):  [50 %] 50 % (08/05 0800) Physical Exam: General: NAD, rests comfortably in bed Cardiovascular: RRR, no murmurs rubs or gallops Respiratory: normal effort, on 6L [place], no increased work of breathing. Good air movement with intermittent expiratory wheezing. Abdomen: soft, nontender, nondistended Extremities: full ROM, no  edema noted today   Laboratory:  Recent Labs Lab 08/19/15 1452 08/20/15 0328 08/21/15 0341  WBC 15.6* 15.1* 13.8*  HGB 10.6* 10.3* 10.1*  HCT 36.2 35.7* 34.0*  PLT 493* 451* 463*    Recent Labs Lab 08/19/15 1452 08/20/15 0328 08/21/15 0341  NA 142 141 140  K 4.3 4.1 3.9  CL 102  101 99*  CO2 28 33* 30  BUN 10 9 17   CREATININE 1.18* 0.97 0.98  CALCIUM 9.4 9.2 9.2  GLUCOSE 122* 104* 131*      Imaging/Diagnostic Tests: Cardiac Echo 08/21/2015 STUDY CONCLUSIONS: - Left ventricle: The cavity size was normal. Wall thickness was   increased in a pattern of mild LVH. Systolic function was normal.   The estimated ejection fraction was in the range of 55% to 60%.   Wall motion was normal; there were no regional wall motion   abnormalities. Doppler parameters are consistent with abnormal   left ventricular relaxation (grade 1 diastolic dysfunction). The   E/e&' ratio is between 8-15, suggesting indeterminate LV filling   pressure. - Mitral valve: Calcified annulus. Mildly thickened leaflets .   There was trivial regurgitation. - Left atrium: The atrium was mildly dilated. - Right ventricle: Poorly visualized. - Right atrium: Moderately dilated. - Tricuspid valve: There was mild regurgitation. - Pulmonary arteries: PA peak pressure: 49 mm Hg (S). - Inferior vena cava: The vessel was dilated. The respirophasic   diameter changes were blunted (< 50%), consistent with elevated   central venous pressure. IMPRESSIONS: - Compared to a prior echo in 06/2014, there are few changes. The   RVSP is elevated to 49 mmHg in this study, suggestive of moderate   pulmonary hypertension. There is moderate RAE, but the RV was not   well-visualized and may be dilated.  Smiley Houseman, MD 08/22/2015, 7:10 AM PGY-1, [place] Intern pager: [phone], text pages welcome

## 2015-08-22 NOTE — Discharge Instructions (Signed)
You were admitted for a COPD and heart failure flare.  - You will complete 2 more days of Doxycycline (antibiotic). Your first home dose will be 8/6 in the evening.  - you will complete 2 more days of Prednisone (steroid). Your first home dose will be 8/7   You have a lab visit to get your potassium checked. Please go to this. You also have hospital follow up visit with family medicine and also with a lung doctor.   We provided you with a prescription for nicotine gum per your request.

## 2015-08-22 NOTE — Progress Notes (Signed)
Pt c/o productive cough, expectorate small, white and thick mucus. MD paged at [place], order for mucinex placed.

## 2015-08-22 NOTE — Progress Notes (Signed)
At 2151 CBG 191, order parameters for insulin not met. At 2200 pt requested insulin because she think that her blood sugar is high and it is not being controled well since she is in the hospital. Pt on 6 l O2 dependent, took off oxygen at 2210, got dressed, call family member for a ride. Pt ready to leave AMA if insulin not administered. Pt spoke with MD. MD decided to give 1 unit of insulin and pt put O2 back on at 2230. 1 unit of insulin given as ordered. Pt in the chair refused chair alarm. At 2330 pt back in the bed resting. At 0311 CBG 93. At 0430 pt alert and oriented X 4.

## 2015-08-23 DIAGNOSIS — I509 Heart failure, unspecified: Secondary | ICD-10-CM

## 2015-08-23 LAB — GLUCOSE, CAPILLARY
Glucose-Capillary: 93 mg/dL (ref 65–99)
Glucose-Capillary: 77 mg/dL (ref 65–99)
Glucose-Capillary: 102 mg/dL — ABNORMAL HIGH (ref 65–99)
Glucose-Capillary: 84 mg/dL (ref 65–99)

## 2015-08-23 LAB — CBC
HCT: 33 % — ABNORMAL LOW (ref 36.0–46.0)
Hemoglobin: 9.6 g/dL — ABNORMAL LOW (ref 12.0–15.0)
MCH: 27.3 pg (ref 26.0–34.0)
MCHC: 29.1 g/dL — ABNORMAL LOW (ref 30.0–36.0)
MCV: 93.8 fL (ref 78.0–100.0)
Platelets: 445 10*3/uL — ABNORMAL HIGH (ref 150–400)
RBC: 3.52 MIL/uL — ABNORMAL LOW (ref 3.87–5.11)
RDW: 16.8 % — ABNORMAL HIGH (ref 11.5–15.5)
WBC: 12.7 10*3/uL — ABNORMAL HIGH (ref 4.0–10.5)

## 2015-08-23 LAB — BASIC METABOLIC PANEL
Anion gap: 7 (ref 5–15)
BUN: 14 mg/dL (ref 6–20)
CO2: 33 mmol/L — ABNORMAL HIGH (ref 22–32)
Calcium: 9 mg/dL (ref 8.9–10.3)
Chloride: 101 mmol/L (ref 101–111)
Creatinine, Ser: 0.9 mg/dL (ref 0.44–1.00)
GFR calc Af Amer: >60 mL/min (ref >60)
GFR calc non Af Amer: >60 mL/min (ref >60)
Glucose, Bld: 89 mg/dL (ref 65–99)
Potassium: 3.6 mmol/L (ref 3.5–5.1)
Sodium: 141 mmol/L (ref 135–145)

## 2015-08-23 MED ORDER — DOXYCYCLINE HYCLATE 100 MG PO TABS: 100.0000 mg | ORAL_TABLET | Freq: Two times a day (BID) | ORAL | 0 refills | Status: DC

## 2015-08-23 MED ORDER — PREDNISONE 20 MG PO TABS: 40.0000 mg | ORAL_TABLET | Freq: Every day | ORAL | 0 refills | Status: DC

## 2015-08-23 NOTE — Care Management Important Message (Signed)
Important Message  Patient Details  Name: Tiffany [name] MRN: [id number] Date of Birth: [birth year]   Medicare Important Message Given:  Yes    Rolla Kedzierski Abena 08/23/2015, 11:24 AM

## 2015-08-23 NOTE — Progress Notes (Signed)
Patient to be discharged today by primary team. Pulmonary follow up arranged with Dr. Melvyn Novas 8/21.  Georgann Housekeeper, AGACNP-BC [facility] Pulmonology/Critical Care Pager [phone] or [phone]  08/23/2015 11:38 AM

## 2015-08-23 NOTE — Progress Notes (Signed)
Family Medicine Teaching Service Daily Progress Note Intern Pager: [phone]  Patient name: Tiffany [name] Medical record number: [id number] Date of birth: [birth year] Age: 55 y.o. Gender: female  Primary Care Provider: Kathrine Cords, MD Consultants: PCCM Code Status: FULL  Pt Overview and Major Events to Date:  08/19/2015 Pt admitted to SDU on continuous bipap, weaned to 5L [place] with nocturnal bipap 08/21/15: transitioned to PO Lasix and PO steroid; transferred to floor   Assessment and Plan: Tiffany Shirar McElrathis a 55 y.o.femalepresenting with acute respiratory failure. PMHx significant for HTN, O2-dependent COPD on 6L at home,HTN, asthma, T2DM, HLD, bipolar 1, tobacco abuse, remote history of suspected MI, OSA, seizures, cocaine use.   #Acute on chronic respiratory failure with hypoxemia - thought to be secondary to COPD exacerbation with CHF and OSA components.  O2 saturation in upper 90's on home 6L by [place]. - Doxycycline 100 mg BID (day #4/5) - continue at discharge to complete course - PO Prednisone 40mg  daily (steroids day #4/5) - continue at discharge to complete course - PO Lasix 40mg  daily - continue home asthma regimen (Dulera BID, Singulair, Spiriva) plus scheduled duonebs - strict I&Os: net -3.675 L since admission - smoking cessation - c/s Pulm: appreciate recs. BiPap prn and qhs, plan for PCCM follow up at discharge - CM to help patient with bipap with O2 for home, home O2 tank, plan for DC today  #Cough productive of bloody sputum, none today: New infection vs. more serious etiology such as malignancy. Patient reports onset of cough this morning, no fevers, decreased white count.   - CT with contrast (08/23/2015) RUL infiltrate, possible PNA - plan to complete doxycycline course at discharge   #Elevated troponin- Resolved. Thought to be 2/2 demand ischemia. No chest pain, no EKG changes.  - Monitor kidney function, potassium - continue atorvastatin, ASA  #HTN -  stable.  BP WNL this AM - resumed home norvasc, and lisinopril - Lasix as above - home verapamil held on admit  #Mood disorder - stable. Followed by Dr. [name] - continue home ziprasidone 80 mg BID, escitalopram 20 mg daily  #T2DM - Well controlled. Last HbA1C 5.6 on 06/01/15. CBG WNL. Patient with concerns about  - holding home metformin - continue home gabapentin - monitor CBG's q4h - SSI  #Tobacco abuse - patient has been strongly encouraged to quit. Continues to smoke Daily, >20 pack year history - nicotine 21 mg patch while hospitalized - continue smoking cessation counseling  #Hx seizures and neuropathy: - Continue home gabapentin 600 mg TID  FEN/GI: HH/Carb mod diet, PPI PPx: Subcutaneous heparin   Disposition: Discharge home today once Bipap with O2 and O2 tank are available; working with CM  Subjective:  Patient breathing better today, does endorse continued cough productive of white sputum  Objective: Temp:  [97.9 F (36.6 C)-98.7 F (37.1 C)] 97.9 F (36.6 C) (08/07 0508) Pulse Rate:  [50-77] 50 (08/07 0508) Resp:  [17-20] 18 (08/07 0508) BP: (139-150)/(72-82) 141/81 (08/07 0508) SpO2:  [97 %-98 %] 98 % (08/07 0508) Weight:  [110.7 kg (244 lb)] 110.7 kg (244 lb) (08/07 0421) Physical Exam: General: NAD Cardiovascular: RRR, no m/r/g Respiratory: +diffuse wheezes throughout all lung fields, no rhonchi or  appreciated Abdomen: soft, nontender, nondistended Extremities: 1+ edema LE bilaterally  Laboratory:  Recent Labs Lab 08/21/15 0341 08/22/15 0848 08/23/15 0456  WBC 13.8* 12.3* 12.7*  HGB 10.1* 10.1* 9.6*  HCT 34.0* 34.1* 33.0*  PLT 463* 500* 445*    Recent Labs  Lab 08/21/15 0341 08/22/15 0848 08/23/15 0456  NA 140 142 141  K 3.9 3.2* 3.6  CL 99* 98* 101  CO2 30 38* 33*  BUN 17 14 14   CREATININE 0.98 0.91 0.90  CALCIUM 9.2 9.1 9.0  GLUCOSE 131* 88 89     Imaging/Diagnostic Tests: Ct Chest W Contrast  Result Date:  08/22/2015 CLINICAL DATA:  Shortness of breath and bloody sputum EXAM: CT CHEST WITH CONTRAST TECHNIQUE: Multidetector CT imaging of the chest was performed during intravenous contrast administration. CONTRAST:  68mL ISOVUE-300 IOPAMIDOL (ISOVUE-300) INJECTION 61% COMPARISON:  08/19/2015 FINDINGS: Cardiovascular: Thoracic aorta and pulmonary artery as visualized are within normal limits. No significant coronary calcifications are noted. Mediastinum/Nodes: No significant hilar or mediastinal adenopathy is noted. The thoracic inlet is within normal limits. Lungs/Pleura: The lungs are well aerated bilaterally. Mild scarring is noted in the left lower lobe. Patchy infiltrate is noted in the right upper lobe similar to that seen on recent CT examination. Upper Abdomen: Within normal limits. Musculoskeletal: Within normal limits for the patient's age. IMPRESSION: Right upper lobe pneumonia. Electronically Signed   By: Inez Catalina M.D.   On: 08/22/2015 18:04    Everrett Coombe, MD 08/23/2015, 7:34 AM PGY-1, [place] Intern pager: [phone], text pages welcome

## 2015-08-23 NOTE — Care Management Note (Signed)
Case Management Note  Patient Details  Name: LADEIDRA SANDINO MRN: [id number] Date of Birth: February 13, 1960  Subjective/Objective:                 Patient admitted with hypoxia. On 6L O2 at home PTA through [facility]. Patient will DC to home today, self care.    Action/Plan:  [facility] to provide transport O2, and connector so she can connect her CPAP to O2 at night.  Expected Discharge Date:                  Expected Discharge Plan:  Home/Self Care  In-House Referral:  NA  Discharge planning Services  CM Consult  Post Acute Care Choice:  NA Choice offered to:  NA  DME Arranged:  N/A DME Agency:  NA  HH Arranged:  NA HH Agency:  NA  Status of Service:  Completed, signed off  If discussed at [place] of Stay Meetings, dates discussed:    Additional Comments:  Carles Collet, RN 08/23/2015, 12:58 PM

## 2015-08-24 ENCOUNTER — Other Ambulatory Visit: Payer: Commercial Managed Care - HMO

## 2015-08-24 ENCOUNTER — Other Ambulatory Visit: Payer: Self-pay | Admitting: *Deleted

## 2015-08-24 DIAGNOSIS — I10 Essential (primary) hypertension: Secondary | ICD-10-CM | POA: Diagnosis not present

## 2015-08-24 NOTE — Patient Outreach (Signed)
[place] [facility]) Care Management  08/24/2015  YAELIS PEPI Jun 20, 1960 [id number]  Member discharged from hospital yesterday.  Voice message received from member stating that she was discharged and requesting a call back to provide information regarding her appointment with pulmonology.  Call placed to member today to initiate transition of care program and to provide follow up from yesterday's voice message.  No answer, HIPAA compliant voice message left.  Will await call back.  If no call back, will follow up within the next 3 days, with hopes of scheduling a home visit for next week.  [name], [place], MSN [place] [phone]

## 2015-08-24 NOTE — Patient Outreach (Signed)
[place] [facility]) Care Management  08/24/2015  Tiffany [name] January 08, 1961 [id number]   Call received back from member.  She confirms that she was hospitalized for COPD, but also with congestive heart failure.  She state that upon arrival to the hospital, her oxygen level was less than 40%.  She state that she stopped wearing her oxygen for several days, and started to have an increased work of breathing.  She state she is back on her home O2 at 6 liters.  She denies any difficulty breathing at this time.    Patient was recently discharged from hospital and all medications have been reviewed.  She report that she has obtained her Doxycycline and her Prednisone, and state she is taking that as well as all other medications as prescribed.    She confirms that she has an appointment with the pulmonologist as well as with her PCP within the next 2 weeks.  She inquires about the contact person at her PCP office that will assist with her copay.  [name] provided with the information for the social worker at her PCP office to contact for the money order.  [name] also advised that there is an order for labs to be done today.  [name] advised to have labs done in preparation for Monday's visit with the PCP.  She verbalizes understanding.  Member denies any further questions, advised to contact with concerns.  Will follow up next week with home visit.  [name], [place], MSN [place] [phone]

## 2015-08-25 DIAGNOSIS — J45909 Unspecified asthma, uncomplicated: Secondary | ICD-10-CM | POA: Diagnosis not present

## 2015-08-25 DIAGNOSIS — R062 Wheezing: Secondary | ICD-10-CM | POA: Diagnosis not present

## 2015-08-25 DIAGNOSIS — J189 Pneumonia, unspecified organism: Secondary | ICD-10-CM | POA: Diagnosis not present

## 2015-08-25 DIAGNOSIS — J440 Chronic obstructive pulmonary disease with acute lower respiratory infection: Secondary | ICD-10-CM | POA: Diagnosis not present

## 2015-08-25 DIAGNOSIS — R0602 Shortness of breath: Secondary | ICD-10-CM | POA: Diagnosis not present

## 2015-08-25 DIAGNOSIS — G4733 Obstructive sleep apnea (adult) (pediatric): Secondary | ICD-10-CM | POA: Diagnosis not present

## 2015-08-25 LAB — BASIC METABOLIC PANEL WITH GFR
BUN: 15 mg/dL (ref 7–25)
CO2: 30 mmol/L (ref 20–31)
Calcium: 9.8 mg/dL (ref 8.6–10.4)
Chloride: 103 mmol/L (ref 98–110)
Creat: 1.06 mg/dL — ABNORMAL HIGH (ref 0.50–1.05)
GFR, Est African American: 68 mL/min (ref >=60)
GFR, Est Non African American: 59 mL/min — ABNORMAL LOW (ref >=60)
Glucose, Bld: 140 mg/dL — ABNORMAL HIGH (ref 65–99)
Potassium: 4.2 mmol/L (ref 3.5–5.3)
Sodium: 143 mmol/L (ref 135–146)

## 2015-08-25 NOTE — Discharge Summary (Signed)
[facility] Discharge Summary  Patient name: Tiffany [name] Medical record number: [id number] Date of birth: Sep 13, 1960 Age: 55 y.o. Gender: female Date of Admission: 08/19/2015  Date of Discharge: 08/23/2015 Admitting Physician: Tiffany Reasons, MD  Primary Care Provider: Kathrine Cords, MD Consultants: PCCM, cardiology  Indication for Hospitalization: Dyspnea and oxygen desaturations  Discharge Diagnoses/Problem List:  COPD exacerbation,  hypertension, T2DM, tobacco use, History of seizures and neuropathy   Disposition: Patient discharged home.  Discharge Condition: Stable  Discharge Exam:  Temp:  [97.9 F (36.6 C)-98.7 F (37.1 C)] 97.9 F (36.6 C) (08/07 0508) Pulse Rate:  [50-77] 50 (08/07 0508) Resp:  [17-20] 18 (08/07 0508) BP: (139-150)/(72-82) 141/81 (08/07 0508) SpO2:  [97 %-98 %] 98 % (08/07 0508) Weight:  [110.7 kg (244 lb)] 110.7 kg (244 lb) (08/07 0421) Physical Exam: General: NAD Cardiovascular: RRR, no m/r/g Respiratory: +diffuse wheezes throughout all lung fields, no rhonchi or  appreciated Abdomen: soft, nontender, nondistended Extremities: 1+ edema LE bilaterally   Brief Hospital Course:  COPD - [name] presented to the hospital with worsening dyspnea and oxygen desaturations as well as productive cough, most concerning for a COPD exacerbation.  She was admitted to the step down unit and requiring BiPAP overnight.  Patient was treated with a 5 day course of prednisone (40 mg BID, to end on 08/24/15) as well as doxycycline 100 mg BID, five days to be completed on 08/24/2015.  Patient with infiltrate identified on lung CT, to be treated with doxycycline course.  CHF - The patient appeared to be mildly fluid overloaded and thus a component of CHF exacerbation may have been contributing to her dyspnea. Troponins were elevated on admission with unchanged EKG and no chest pain.  Troponins trended down  and the patient was followed by  cardiology.  Elevated troponins were thought to be due to demand ischemia from the CHF/COPD exacerbation.  She was diuresed with IV Lasix and transitioned to PO Lasix 40 mg daily.  The patient was weaned off of Bipap and at discharge was tolerating her home 6L oxygen.  Cough with bloody sputum - Patient complained of cough with bloody sputum during hospitalization.  This was thought to likely be due to acute illness, however given extensive pulmonary illness and tobacco use, CT chest was performed to rule out malignancy. Results are below, and were negative except for possible infiltrate reflecting pneumonia.  Issues for Follow Up:  1. Patient to follow up with pulm/critical care. 2. Tobacco cessation counseling, currently smoking with 6L O2 at home.  Significant Procedures: Ct Chest W Contrast  Result Date: 08/22/2015 CLINICAL DATA:  Shortness of breath and bloody sputum EXAM: CT CHEST WITH CONTRAST TECHNIQUE: Multidetector CT imaging of the chest was performed during intravenous contrast administration. CONTRAST:  74m ISOVUE-300 IOPAMIDOL (ISOVUE-300) INJECTION 61% COMPARISON:  08/19/2015 FINDINGS: Cardiovascular: Thoracic aorta and pulmonary artery as visualized are within normal limits. No significant coronary calcifications are noted. Mediastinum/Nodes: No significant hilar or mediastinal adenopathy is noted. The thoracic inlet is within normal limits. Lungs/Pleura: The lungs are well aerated bilaterally. Mild scarring is noted in the left lower lobe. Patchy infiltrate is noted in the right upper lobe similar to that seen on recent CT examination. Upper Abdomen: Within normal limits. Musculoskeletal: Within normal limits for the patient's age. IMPRESSION: Right upper lobe pneumonia. Electronically Signed   By: MInez CatalinaM.D.   On: 08/22/2015 18:04    Significant Labs and Imaging:   Recent Labs Lab  08/21/15 0341 08/22/15 0848 08/23/15 0456  WBC 13.8* 12.3* 12.7*  HGB 10.1* 10.1* 9.6*   HCT 34.0* 34.1* 33.0*  PLT 463* 500* 445*    Recent Labs Lab 08/20/15 0328 08/21/15 0341 08/22/15 0848 08/23/15 0456 08/24/15 1505  NA 141 140 142 141 143  K 4.1 3.9 3.2* 3.6 4.2  CL 101 99* 98* 101 103  CO2 33* 30 38* 33* 30  GLUCOSE 104* 131* 88 89 140*  BUN '9 17 14 14 15  ' CREATININE 0.97 0.98 0.91 0.90 1.06*  CALCIUM 9.2 9.2 9.1 9.0 9.8     Results/Tests Pending at Time of Discharge: None  Discharge Medications:    Medication List    TAKE these medications   ACCU-CHEK AVIVA PLUS w/Device Kit TEST FOUR TIMES DAILY   ACCU-CHEK SOFTCLIX LANCETS lancets   albuterol (2.5 MG/3ML) 0.083% nebulizer solution Commonly known as:  PROVENTIL Take 3 mLs (2.5 mg total) by nebulization every 6 (six) hours as needed for wheezing or shortness of breath. What changed:  Another medication with the same name was changed. Make sure you understand how and when to take each.   albuterol 108 (90 Base) MCG/ACT inhaler Commonly known as:  PROVENTIL HFA;VENTOLIN HFA Inhale 2 puffs into the lungs every 6 (six) hours as needed. What changed:  [name] to take this   amLODipine 5 MG tablet Commonly known as:  NORVASC Take 2 tablets (10 mg total) by mouth daily.   aspirin 81 MG EC tablet TAKE 1 TABLET (81 MG TOTAL) BY MOUTH DAILY. What changed:  See the new instructions.   atorvastatin 40 MG tablet Commonly known as:  LIPITOR Take 1 tablet (40 mg total) by mouth daily.   doxycycline 100 MG tablet Commonly known as:  VIBRA-TABS Take 1 tablet (100 mg total) by mouth every 12 (twelve) hours.   escitalopram 20 MG tablet Commonly known as:  LEXAPRO Take 1 tablet (20 mg total) by mouth daily.   fluticasone furoate-vilanterol 100-25 MCG/INH Aepb Commonly known as:  BREO ELLIPTA Inhale 1 puff into the lungs daily.   furosemide 40 MG tablet Commonly known as:  LASIX Take 1 tablet (40 mg total) by mouth daily. Take Lasix twice daily from 7/14 until 7/18, then daily What changed:   additional instructions   gabapentin 600 MG tablet Commonly known as:  NEURONTIN Take 1 tablet (600 mg total) by mouth 3 (three) times daily.   glucose blood test strip Use to check blood sugar 3 times daily.   lisinopril 40 MG tablet Commonly known as:  PRINIVIL,ZESTRIL Take 1 tablet (40 mg total) by mouth daily.   loratadine 10 MG tablet Commonly known as:  CLARITIN Take 1 tablet (10 mg total) by mouth daily.   metFORMIN 500 MG 24 hr tablet Commonly known as:  GLUCOPHAGE-XR Take 1 tablet (500 mg total) by mouth 2 (two) times daily.   mirtazapine 30 MG tablet Commonly known as:  REMERON Take 1 tablet (30 mg total) by mouth at bedtime.   montelukast 10 MG tablet Commonly known as:  SINGULAIR Take 1 tablet (10 mg total) by mouth at bedtime.   multivitamin with minerals tablet Take 1 tablet by mouth daily. Reported on 07/28/2015   naproxen 500 MG tablet Commonly known as:  NAPROSYN Take 1 tablet (500 mg total) by mouth 3 (three) times daily as needed. What changed:  [name] to take this   nicotine polacrilex 2 MG gum Commonly known as:  [employer] NICOTINE POLACRILEX Take 1 each (2  mg total) by mouth as needed for smoking cessation. Do not exceed 24 pieces in a day.   omeprazole 40 MG capsule Commonly known as:  PRILOSEC Take 1 capsule (40 mg total) by mouth daily.   oxybutynin 5 MG tablet Commonly known as:  DITROPAN Take 1 tablet (5 mg total) by mouth 3 (three) times daily.   potassium chloride 10 MEQ tablet Commonly known as:  K-DUR,KLOR-CON Take 2 tablets (20 mEq total) by mouth daily.   predniSONE 20 MG tablet Commonly known as:  DELTASONE Take 2 tablets (40 mg total) by mouth daily with breakfast.   SYMBICORT 160-4.5 MCG/ACT inhaler Generic drug:  budesonide-formoterol Inhale 2 puffs into the lungs 2 (two) times daily.   tiotropium 18 MCG inhalation capsule Commonly known as:  SPIRIVA HANDIHALER Place 1 capsule (18 mcg total) into inhaler and inhale daily.    verapamil 240 MG 24 hr capsule Commonly known as:  VERELAN PM Take 2 capsules (480 mg total) by mouth at bedtime.   ziprasidone 80 MG capsule Commonly known as:  GEODON Take 1 capsule (80 mg total) by mouth 2 (two) times daily with a meal. What changed:  how much to take  when to take this       Discharge Instructions: Please refer to Patient Instructions section of EMR for full details.  Patient was counseled important signs and symptoms that should prompt return to medical care, changes in medications, dietary instructions, activity restrictions, and follow up appointments.   Follow-Up Appointments: Follow-up Information    Christinia Gully, MD .   Specialty:  Pulmonary Disease Why:  Pulmonology (lung) doctor. Appointment on 09/06/15 at [place] information: [address] [address] [phone]        Mercy Riding, MD .   Specialty:  Family Medicine Why:  [facility] follow up visit on  8/14 at 8:45AM Contact information: [place] [id number] [phone]        [place] .   Specialty:  Family Medicine Why:  [facility] Lab visit on 08/24/15 at 8:30 AM ( to check your potassium level) Contact information: [address] [id number] [place]          Everrett Coombe, MD 08/25/2015, 6:34 PM PGY-1, [place]

## 2015-08-30 ENCOUNTER — Inpatient Hospital Stay: Payer: Self-pay | Admitting: Student

## 2015-09-01 ENCOUNTER — Telehealth: Payer: Self-pay | Admitting: Licensed Clinical Social Worker

## 2015-09-01 ENCOUNTER — Telehealth: Payer: Self-pay | Admitting: Family Medicine

## 2015-09-01 NOTE — Progress Notes (Signed)
Call to patient to discuss day and time for her to come pick up the $45.00 from the indigent funds to assist with the co-pay for her pulmonology appointment on Monday.  Left message to call [name]. Call to [place], left message trying to reach patient in ref to co-pay for upcoming appointment.    Plan: [name] will follow-up with patient within the next 24 hours.  Casimer Lanius, LCSW Licensed Clinical Social Worker [place]   [phone] 11:27 AM

## 2015-09-01 NOTE — Telephone Encounter (Signed)
[place] obtained ID [id number]

## 2015-09-01 NOTE — Telephone Encounter (Signed)
Pt has appt at [facility] on 09-06-15 Monday and needs [facility] referral to [facility].   Npi for dr Marlene Lard is [id number]. Diagnosis code is J 44.9

## 2015-09-02 ENCOUNTER — Other Ambulatory Visit: Payer: Self-pay | Admitting: *Deleted

## 2015-09-02 ENCOUNTER — Telehealth: Payer: Self-pay | Admitting: Licensed Clinical Social Worker

## 2015-09-02 NOTE — Patient Outreach (Signed)
[facility] [facility]) Care Management  09/02/2015  Tiffany [name] Oct 11, 1960 [id number]   Call/voice message received yesterday from Casimer Lanius , social worker with member's PCP office, concerning money order for member to visit Pulmonology office on Monday.  She stated that she was unable to reach the member and wanted to verify that she would have transportation to pick up money order prior to her appointment date.    Call placed to member, she confirms that she will be able to get the money order this afternoon.  She also confirms home visit scheduled for tomorrow afternoon.    Call then placed back to D. [place] to provide update.    Will proceed with home visit tomorrow.  [name], [place], [place] [phone]

## 2015-09-02 NOTE — Progress Notes (Addendum)
Return call to patient, left another message to call [name] in reference to co-pay for appointment on Monday.    Received return call from patient's [place].  States patient will come to [facility] office today to pick up the $45.00 money order made out to [place] for her co-pay.  Received return call from patient to confirm that she will come to [facility] today at 3:00.  Plan: Money order left at front desk for patient to pick up.  Casimer Lanius, LCSW Licensed Clinical Social Worker [facility]   [phone] 12:38 PM

## 2015-09-03 ENCOUNTER — Other Ambulatory Visit: Payer: Self-pay | Admitting: Family Medicine

## 2015-09-03 ENCOUNTER — Other Ambulatory Visit: Payer: Self-pay | Admitting: *Deleted

## 2015-09-03 MED ORDER — AMLODIPINE BESYLATE 5 MG PO TABS: 10.0000 mg | ORAL_TABLET | Freq: Every day | ORAL | 2 refills | Status: DC

## 2015-09-03 NOTE — Telephone Encounter (Signed)
Needs amlodipine.  Please call into [facility]

## 2015-09-03 NOTE — Patient Outreach (Addendum)
[place] [facility]) Care Management   09/03/2015  Tiffany [name] 1960-01-29 [id number]  Tiffany [name] is an 55 y.o. female  Subjective:   Member state that she is "doing alright."  She denies any shortness of breath or trouble breathing at this time, but does report having some with activity.  She report she is taking all medications appropriately although she does not any Amlodipine in the home.  She does not recall when she last took it.  Objective:   Review of Systems  Constitutional: Negative.   HENT: Positive for congestion.   Eyes: Negative.   Respiratory: Positive for cough and sputum production.   Cardiovascular: Negative.   Gastrointestinal: Negative.   Genitourinary: Negative.   Musculoskeletal: Negative.   Skin: Negative.   Neurological: Negative.   Endo/Heme/Allergies: Negative.   Psychiatric/Behavioral: Positive for depression.    Physical Exam  Constitutional: [name] oriented to person, place, and time. She appears well-developed and well-nourished.  Neck: Normal range of motion.  Cardiovascular: Normal rate, regular rhythm and normal heart sounds.   Respiratory: Effort normal and breath sounds normal.  GI: Soft. Bowel sounds are normal.  Musculoskeletal: Normal range of motion.  Neurological: [name] alert and oriented to person, place, and time.  Skin: Skin is warm and dry.    BP 132/78 (BP Location: Left Arm, Patient Position: Sitting, Cuff Size: Normal)   Pulse 66   SpO2 98%    Encounter Medications:   Outpatient Encounter Prescriptions as of 09/03/2015  Medication Sig  . ACCU-CHEK SOFTCLIX LANCETS lancets   . albuterol (PROVENTIL HFA;VENTOLIN HFA) 108 (90 Base) MCG/ACT inhaler Inhale 2 puffs into the lungs every 6 (six) hours as needed. (Patient taking differently: Inhale 2 puffs into the lungs every 6 (six) hours as needed for wheezing or shortness of breath. )  . albuterol (PROVENTIL) (2.5 MG/3ML) 0.083% nebulizer solution Take 3 mLs  (2.5 mg total) by nebulization every 6 (six) hours as needed for wheezing or shortness of breath.  [name] aspirin 81 MG EC tablet TAKE 1 TABLET (81 MG TOTAL) BY MOUTH DAILY. (Patient taking differently: TAKE 1 TABLET (81 MG TOTAL) BY MOUTH every morning)  . atorvastatin (LIPITOR) 40 MG tablet Take 1 tablet (40 mg total) by mouth daily.  . Blood Glucose Monitoring Suppl (ACCU-CHEK AVIVA PLUS) w/Device KIT TEST FOUR TIMES DAILY  . escitalopram (LEXAPRO) 20 MG tablet Take 1 tablet (20 mg total) by mouth daily.  . fluticasone furoate-vilanterol (BREO ELLIPTA) 100-25 MCG/INH AEPB Inhale 1 puff into the lungs daily.  . furosemide (LASIX) 40 MG tablet Take 1 tablet (40 mg total) by mouth daily. Take Lasix twice daily from 7/14 until 7/18, then daily (Patient taking differently: Take 40 mg by mouth daily. )  . gabapentin (NEURONTIN) 600 MG tablet Take 1 tablet (600 mg total) by mouth 3 (three) times daily.  [name] glucose blood test strip Use to check blood sugar 3 times daily.  [name] lisinopril (PRINIVIL,ZESTRIL) 40 MG tablet Take 1 tablet (40 mg total) by mouth daily.  . metFORMIN (GLUCOPHAGE-XR) 500 MG 24 hr tablet Take 1 tablet (500 mg total) by mouth 2 (two) times daily.  . mirtazapine (REMERON) 30 MG tablet Take 1 tablet (30 mg total) by mouth at bedtime.  . montelukast (SINGULAIR) 10 MG tablet Take 1 tablet (10 mg total) by mouth at bedtime.  . naproxen (NAPROSYN) 500 MG tablet Take 1 tablet (500 mg total) by mouth 3 (three) times daily as needed. (Patient taking differently:  Take 500 mg by mouth 3 (three) times daily as needed for mild pain. )  . omeprazole (PRILOSEC) 40 MG capsule Take 1 capsule (40 mg total) by mouth daily.  [name] oxybutynin (DITROPAN) 5 MG tablet Take 1 tablet (5 mg total) by mouth 3 (three) times daily.  . potassium chloride (K-DUR,KLOR-CON) 10 MEQ tablet Take 2 tablets (20 mEq total) by mouth daily.  . SYMBICORT 160-4.5 MCG/ACT inhaler Inhale 2 puffs into the lungs 2 (two) times daily.   [name]  tiotropium (SPIRIVA HANDIHALER) 18 MCG inhalation capsule Place 1 capsule (18 mcg total) into inhaler and inhale daily.  . verapamil (VERELAN PM) 240 MG 24 hr capsule Take 2 capsules (480 mg total) by mouth at bedtime.  . ziprasidone (GEODON) 80 MG capsule Take 1 capsule (80 mg total) by mouth 2 (two) times daily with a meal. (Patient taking differently: Take 160 mg by mouth at bedtime. )  . amLODipine (NORVASC) 5 MG tablet Take 2 tablets (10 mg total) by mouth daily. (Patient not taking: Reported on 09/03/2015)  . doxycycline (VIBRA-TABS) 100 MG tablet Take 1 tablet (100 mg total) by mouth every 12 (twelve) hours. (Patient not taking: Reported on 09/03/2015)  . loratadine (CLARITIN) 10 MG tablet Take 1 tablet (10 mg total) by mouth daily. (Patient not taking: Reported on 09/03/2015)  . Multiple Vitamins-Minerals (MULTIVITAMIN WITH MINERALS) tablet Take 1 tablet by mouth daily. Reported on 07/28/2015  . nicotine polacrilex (CVS NICOTINE POLACRILEX) 2 MG gum Take 1 each (2 mg total) by mouth as needed for smoking cessation. Do not exceed 24 pieces in a day.  . predniSONE (DELTASONE) 20 MG tablet Take 2 tablets (40 mg total) by mouth daily with breakfast. (Patient not taking: Reported on 09/03/2015)   No facility-administered encounter medications on file as of 09/03/2015.     Functional Status:   In your present state of health, do you have any difficulty performing the following activities: 08/19/2015 07/16/2015  Hearing? N N  Vision? N N  Difficulty concentrating or making decisions? N N  Walking or climbing stairs? N Y  Dressing or bathing? N N  Doing errands, shopping? N Y  Conservation officer, nature and eating ? - N  Using the Toilet? - N  In the past six months, have you accidently leaked urine? - N  Do you have problems with loss of bowel control? - N  Managing your Medications? - N  Managing your Finances? - N  Housekeeping or managing your Housekeeping? - N  Some recent data might be hidden     Fall/Depression Screening:    PHQ 2/9 Scores 07/28/2015 06/01/2015 05/14/2015 01/20/2015 12/17/2014 12/07/2014 10/20/2014  PHQ - 2 Score 0 0 1 0 3 0 0  PHQ- 9 Score - - - - - - -    Assessment:    Met with member at scheduled time, she is on her 6L nasal canula.  She reports that she does continue to smoke, smoking 1 pack of cigarettes yesterday.  Advised against smoking due to lung condition and oxygen in the home.  Medications reviewed, she refuses to get her pill box and her inhalers, stating that her pill box is already full.  Attempted to review difference between her inhalers, she state that she knows which ones to use and when.  She again refused to obtain them.  Member state that she missed her PCP appointment on Monday because she forgot about the appointment.  She also state that she has not picked up  the money order for Monday's visit with the pulmonologist.  This care manager placed call to PCP office in attempt to reschedule appointment, unable to get through.  Member is strongly urged to get to the office before they close to get money order and to reschedule PCP visit.  She verbalizes understanding.  Discussed with member again the importance in self monitoring (daily weights, blood sugar, and blood pressure).  She denies being consistent with any of the above, but state she will begin.  Heart failure zones discussed. She was requested to obtain her [facility] calendar tool book from her bedroom to visualize the charts, she refuses to get it, stating "I know what you talking about, I have it and can look at it later."    She denies any questions at this time, advised to contact for concerns.  Plan:   Will follow up next week for transition of care program.  [facility] CM Care Plan Problem One   Flowsheet Row Most Recent Value  Care Plan Problem One  Risk for hospital readmission related to symptom management of COPD as evidenced by inadequate follow-through of instructions  Role Documenting  the Problem One  Care Management [place] for Problem One  Active  THN Long Term Goal (31-90 days)  Member will have functioning CPAP machine within the next 31 days  THN Long Term Goal Start Date  07/28/15  [facility] Long Term Goal Met Date  08/24/15  Interventions for Problem One Long Term Goal  Instructed member to contact [place] PCP office to have new machine ordered since she is now on oxygen  [facility]N CM Short Term Goal #1 (0-30 days)  Member will have chest xray completed within the next week  [facility]N CM Short Term Goal #1 Start Date  07/28/15  [facility] CM Short Term Goal #1 Met Date  08/24/15  Interventions for Short Term Goal #1  Explained to member through discussion the importance of obtaining chest xray and its relation to managing symptoms.  [facility]N CM Short Term Goal #2 (0-30 days)  Member will report discussing smoking cessation techniques with psych physician within the next 4 weeks  THN CM Short Term Goal #2 Start Date  07/28/15  [facility] CM Short Term Goal #2 Met Date  -- [goal not met]  Interventions for Short Term Goal #2  Discussed the importance of smoking cessation and management of COPD, advised to discuss with psych physician     [facility] CM Care Plan Problem Two   Flowsheet Row Most Recent Value  Care Plan Problem Two  Risk for readmission to hospital related to COPD and Heart Failure as evidenced by recent hospitalization  Role Documenting the Problem Two  Care Management [place] for Problem Two  Active  Interventions for Problem Two Long Term Goal   Discussed discharge instructions, advised on importance of following and taking all medications as prescribed.  Member confirmed she has all medications.  THN Long Term Goal (31-90) days  Member will not be readmitted to hospital within the next 31 days  THN Long Term Goal Start Date  08/24/15  [facility] CM Short Term Goal #1 (0-30 days)  Member will attend follow up appointment with PCP within the next 2 weeks  THN  CM Short Term Goal #1 Start Date  09/03/15  [facility] CM Short Term Goal #1 Met Date   -- [Goal not met, reset]  Interventions for Short Term Goal #2   Call placed  to PCP office to reschedule, advised member to [place] appointment when she is picking up her money order  [facility] CM Short Term Goal #2 (0-30 days)  Member will attend appointment with Pulmonologist within the next 2 weeks  THN CM Short Term Goal #2 Start Date  08/24/15  Interventions for Short Term Goal #2  Confirmed appointment scheduled for 8/21, denies needing transportation, confirmed with member that PCP office will provide funds for copay     [name], Therapist, sports, MSN [place] Manager [phone]

## 2015-09-06 ENCOUNTER — Institutional Professional Consult (permissible substitution): Payer: Self-pay | Admitting: Internal Medicine

## 2015-09-09 ENCOUNTER — Other Ambulatory Visit: Payer: Self-pay | Admitting: *Deleted

## 2015-09-09 ENCOUNTER — Telehealth: Payer: Self-pay | Admitting: Licensed Clinical Social Worker
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
## 2015-09-10 ENCOUNTER — Other Ambulatory Visit: Payer: Self-pay | Admitting: *Deleted
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
## 2015-09-13 ENCOUNTER — Telehealth: Payer: Self-pay | Admitting: Pulmonary Disease

## 2015-09-14 ENCOUNTER — Telehealth: Payer: Self-pay | Admitting: Licensed Clinical Social Worker
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
## 2015-09-16 NOTE — Telephone Encounter (Signed)
Encounter open, but not clear reason.  Likely entered in error.

## 2015-09-17 ENCOUNTER — Other Ambulatory Visit: Payer: Self-pay | Admitting: *Deleted

## 2015-09-17 NOTE — Patient Outreach (Signed)
[facility]) Care Management  09/17/2015  Tiffany [name] October 12, 1960 [id number]   Weekly transition of care call placed to member.  She state that she is "alright."  She reports a persistent cough and shortness of breath on exertion, but state that she continues to wear her oxygen and use inhalers/nebulizer as prescribed.  She state she has checked her oxygen level intermittently, stating it is "good" but unable to provide a range.  She denies checking her weight or blood pressure.  She report her blood sugar today being 110.  Per note from D. Laurance Flatten, member's son found the money order for her pulmonology visit.  She verbalizes understanding of importance of attending appointment.  [name] reminded of her PCP appointment on 9/6 and the pulmonologist appointment on 9/15.  She denies any urgent concerns at this time, will follow up next week.  [name], [place], MSN [place] [phone]

## 2015-09-21 ENCOUNTER — Other Ambulatory Visit (HOSPITAL_COMMUNITY): Payer: Self-pay | Admitting: Psychiatry

## 2015-09-21 DIAGNOSIS — F200 Paranoid schizophrenia: Secondary | ICD-10-CM

## 2015-09-22 ENCOUNTER — Ambulatory Visit (INDEPENDENT_AMBULATORY_CARE_PROVIDER_SITE_OTHER): Payer: Commercial Managed Care - HMO | Admitting: Family Medicine

## 2015-09-22 VITALS — BP 122/66 | HR 67 | Temp 98.9°F | Ht 64.0 in | Wt 242.0 lb

## 2015-09-22 DIAGNOSIS — Z1211 Encounter for screening for malignant neoplasm of colon: Secondary | ICD-10-CM

## 2015-09-22 DIAGNOSIS — Z23 Encounter for immunization: Secondary | ICD-10-CM

## 2015-09-22 DIAGNOSIS — J449 Chronic obstructive pulmonary disease, unspecified: Secondary | ICD-10-CM | POA: Diagnosis not present

## 2015-09-22 DIAGNOSIS — Z0000 Encounter for general adult medical examination without abnormal findings: Secondary | ICD-10-CM

## 2015-09-22 NOTE — Patient Instructions (Signed)
Start using Symbicort  2 puffs twice daily Spiriva 2 puffs (1 capsule) daily  Breo 1 puff daily. Albuterol as needed.  Keep your appointment with the lung doctor  I will message Dr. Berton Mount about Chantix and will let you know if I hear anything from him. Keep decreasing the amount you're smoking and do NOT smoke around your oxygen.  I have referred you for a colonoscopy. You are also due for a mammogram
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
## 2015-09-23 ENCOUNTER — Other Ambulatory Visit: Payer: Self-pay | Admitting: *Deleted

## 2015-09-23 NOTE — Patient Outreach (Signed)
[facility]) Care Management  09/23/2015  Tiffany [name] 02/02/1960 [id number]   Weekly transition of care call placed to member.  She state that she continue to have a cough and congestion, but state she feel the congestion is due to the weather change.  She reports that she is no longer wearing her oxygen 24 hours, but only as needed.  She confirms that she has a pulse oximeter at home, and check her oxygen level on a regular basis, replacing the oxygen if her oxygen levels are below 90%.  She state that her levels increase to 98% once oxygen is back on.  She continues to have shortness of breath with activity, but state that overall she feel her breathing is better.  She confirms that she did have her follow up appointment with her PCP, no concerns stated, recommended follow up for October.  She denies any urgent concerns, reports taking medications without problems.  She confirms home visit for next week as well as pulmonology appointment for next week.  [name] advised to contact this care manager with questions.  [name], [place], MSN [place] [phone]

## 2015-09-24 ENCOUNTER — Ambulatory Visit: Payer: Self-pay | Admitting: *Deleted

## 2015-09-25 DIAGNOSIS — J440 Chronic obstructive pulmonary disease with acute lower respiratory infection: Secondary | ICD-10-CM | POA: Diagnosis not present

## 2015-09-25 DIAGNOSIS — R062 Wheezing: Secondary | ICD-10-CM | POA: Diagnosis not present

## 2015-09-25 DIAGNOSIS — J45909 Unspecified asthma, uncomplicated: Secondary | ICD-10-CM | POA: Diagnosis not present

## 2015-09-25 DIAGNOSIS — R0602 Shortness of breath: Secondary | ICD-10-CM | POA: Diagnosis not present

## 2015-09-25 DIAGNOSIS — G4733 Obstructive sleep apnea (adult) (pediatric): Secondary | ICD-10-CM | POA: Diagnosis not present

## 2015-09-25 DIAGNOSIS — J189 Pneumonia, unspecified organism: Secondary | ICD-10-CM | POA: Diagnosis not present

## 2015-09-27 NOTE — Assessment & Plan Note (Signed)
Seems to be improved from last exam, however still not well controlled. Discussed the most important thing she could do was stop smoking. She was not on O2 when I saw her, I am curious how much she needs it during the daytime at this point.  Continue Symbicort  2 puffs twice daily Spiriva 2 puffs (1 capsule) daily  Breo 1 puff daily. Albuterol as needed (stressed this was PRN, not scheduled)  Stressed keeping pulmonology appt Stressed not smoking anywhere near her O2.

## 2015-09-27 NOTE — Assessment & Plan Note (Signed)
Patient declined flu vaccine today. Provided information on mammogram Referral made to GI for colonoscopy.

## 2015-09-28 ENCOUNTER — Ambulatory Visit (HOSPITAL_COMMUNITY): Payer: Self-pay | Admitting: Clinical

## 2015-09-28 ENCOUNTER — Other Ambulatory Visit (HOSPITAL_COMMUNITY): Payer: Self-pay | Admitting: Psychiatry

## 2015-09-28 DIAGNOSIS — F200 Paranoid schizophrenia: Secondary | ICD-10-CM

## 2015-09-29 ENCOUNTER — Other Ambulatory Visit: Payer: Self-pay | Admitting: Family Medicine

## 2015-09-29 ENCOUNTER — Other Ambulatory Visit: Payer: Self-pay | Admitting: *Deleted

## 2015-09-29 NOTE — Patient Outreach (Signed)
[facility]) Care Management   09/29/2015  Tiffany [name] 1960/03/19 [id number]  Tiffany [name] is an 55 y.o. female  Subjective:   Member reports that she if doing "really good."  She state that she has been taking her medications as instructed and has only had to use her oxygen occasionally.  She reports that she has decreased her smoking to 1/2 pack/day.  She denies daily weights as instructed, but state that her weight last week at her PCP visit was 10 pounds down from her last visit 2 months ago.    Objective:   Review of Systems  Constitutional: Negative.   HENT: Positive for congestion.   Eyes: Negative.   Respiratory: Positive for cough.   Cardiovascular: Positive for leg swelling.  Gastrointestinal: Negative.   Genitourinary: Negative.   Musculoskeletal: Negative.   Skin: Negative.   Neurological: Negative.   Endo/Heme/Allergies: Negative.   Psychiatric/Behavioral: Negative.     Physical Exam  Constitutional: [name] oriented to person, place, and time. She appears well-developed and well-nourished.  Neck: Normal range of motion.  Cardiovascular: Normal rate, regular rhythm and normal heart sounds.   Respiratory: Effort normal and breath sounds normal.  GI: Soft. Bowel sounds are normal.  Musculoskeletal: Normal range of motion.  Neurological: [name] alert and oriented to person, place, and time.  Skin: Skin is warm and dry.    BP (!) 154/82 (BP Location: Left Arm, Patient Position: Sitting, Cuff Size: Normal)   Pulse (!) 104   Resp 20   SpO2 96%    Encounter Medications:   Outpatient Encounter Prescriptions as of 09/29/2015  Medication Sig  . ACCU-CHEK SOFTCLIX LANCETS lancets   . albuterol (PROVENTIL HFA;VENTOLIN HFA) 108 (90 Base) MCG/ACT inhaler Inhale 2 puffs into the lungs every 6 (six) hours as needed. (Patient taking differently: Inhale 2 puffs into the lungs every 6 (six) hours as needed for wheezing or shortness of breath. )  .  albuterol (PROVENTIL) (2.5 MG/3ML) 0.083% nebulizer solution Take 3 mLs (2.5 mg total) by nebulization every 6 (six) hours as needed for wheezing or shortness of breath.  [name] amLODipine (NORVASC) 5 MG tablet Take 2 tablets (10 mg total) by mouth daily.  [name] aspirin 81 MG EC tablet TAKE 1 TABLET (81 MG TOTAL) BY MOUTH DAILY.  [name] atorvastatin (LIPITOR) 40 MG tablet Take 1 tablet (40 mg total) by mouth daily.  . Blood Glucose Monitoring Suppl (ACCU-CHEK AVIVA PLUS) w/Device KIT TEST FOUR TIMES DAILY  . escitalopram (LEXAPRO) 20 MG tablet Take 1 tablet (20 mg total) by mouth daily.  . fluticasone furoate-vilanterol (BREO ELLIPTA) 100-25 MCG/INH AEPB Inhale 1 puff into the lungs daily.  . furosemide (LASIX) 40 MG tablet Take 1 tablet (40 mg total) by mouth daily. Take Lasix twice daily from 7/14 until 7/18, then daily  . gabapentin (NEURONTIN) 600 MG tablet Take 1 tablet (600 mg total) by mouth 3 (three) times daily.  [name] glucose blood test strip Use to check blood sugar 3 times daily.  [name] lisinopril (PRINIVIL,ZESTRIL) 40 MG tablet Take 1 tablet (40 mg total) by mouth daily.  [name] loratadine (CLARITIN) 10 MG tablet Take 1 tablet (10 mg total) by mouth daily.  . metFORMIN (GLUCOPHAGE-XR) 500 MG 24 hr tablet Take 1 tablet (500 mg total) by mouth 2 (two) times daily.  . mirtazapine (REMERON) 30 MG tablet Take 1 tablet (30 mg total) by mouth at bedtime.  . montelukast (SINGULAIR) 10 MG tablet Take 1 tablet (10  mg total) by mouth at bedtime.  . Multiple Vitamins-Minerals (MULTIVITAMIN WITH MINERALS) tablet Take 1 tablet by mouth daily. Reported on 07/28/2015  . naproxen (NAPROSYN) 500 MG tablet Take 1 tablet (500 mg total) by mouth 3 (three) times daily as needed. (Patient taking differently: Take 500 mg by mouth 3 (three) times daily as needed for mild pain. )  . nicotine polacrilex (CVS NICOTINE POLACRILEX) 2 MG gum Take 1 each (2 mg total) by mouth as needed for smoking cessation. Do not exceed 24 pieces in a day.  (Patient not taking: Reported on 09/22/2015)  . omeprazole (PRILOSEC) 40 MG capsule Take 1 capsule (40 mg total) by mouth daily.  [name] oxybutynin (DITROPAN) 5 MG tablet Take 1 tablet (5 mg total) by mouth 3 (three) times daily.  . potassium chloride (K-DUR,KLOR-CON) 10 MEQ tablet Take 2 tablets (20 mEq total) by mouth daily. (Patient taking differently: Take 10 mEq by mouth 2 (two) times daily. )  . SYMBICORT 160-4.5 MCG/ACT inhaler Inhale 2 puffs into the lungs daily.   [name] tiotropium (SPIRIVA HANDIHALER) 18 MCG inhalation capsule Place 1 capsule (18 mcg total) into inhaler and inhale daily.  . verapamil (VERELAN PM) 240 MG 24 hr capsule Take 2 capsules (480 mg total) by mouth at bedtime.  . ziprasidone (GEODON) 80 MG capsule Take 2 capsules (160 mg total) by mouth at bedtime.  . [DISCONTINUED] ziprasidone (GEODON) 80 MG capsule Take 1 capsule (80 mg total) by mouth 2 (two) times daily with a meal. (Patient taking differently: Take 160 mg by mouth at bedtime. )   No facility-administered encounter medications on file as of 09/29/2015.     Functional Status:   In your present state of health, do you have any difficulty performing the following activities: 08/19/2015 07/16/2015  Hearing? N N  Vision? N N  Difficulty concentrating or making decisions? N N  Walking or climbing stairs? N Y  Dressing or bathing? N N  Doing errands, shopping? N Y  Conservation officer, nature and eating ? - N  Using the Toilet? - N  In the past six months, have you accidently leaked urine? - N  Do you have problems with loss of bowel control? - N  Managing your Medications? - N  Managing your Finances? - N  Housekeeping or managing your Housekeeping? - N  Some recent data might be hidden    Fall/Depression Screening:    PHQ 2/9 Scores 07/28/2015 06/01/2015 05/14/2015 01/20/2015 12/17/2014 12/07/2014 10/20/2014  PHQ - 2 Score 0 0 1 0 3 0 0  PHQ- 9 Score - - - - - - -    Assessment:    Met with member at scheduled time.  [name] able  to carry on conversation without shortness of breath.  She denies any questions/concerns regarding her pulmonology appointment this Friday.  She confirms that she still has money order for copay and will be able to drive herself to appointment.  Member is re-educated on COPD action plan/zones.  Provided with copies of zones again, magnet placed on refrigerator.  Plan:   Will follow up with member within the next 2 weeks regarding pulmonology visit.  If no further concerns at that time for care coordination/community needs, will send to health coach for ongoing education regarding management of COPD.   [facility] CM Care Plan Problem Two   Flowsheet Row Most Recent Value  Care Plan Problem Two  Risk for readmission to hospital related to COPD and Heart Failure as evidenced by  recent hospitalization  Role Documenting the Problem Two  Care Management [place] for Problem Two  Active  Interventions for Problem Two Long Term Goal   Discussed discharge instructions, advised on importance of following and taking all medications as prescribed.  Member confirmed she has all medications.  [facility]N Long Term Goal (31-90) days  Member will not be readmitted to hospital within the next 31 days  THN Long Term Goal Start Date  08/24/15  [facility] Long Term Goal Met Date  09/29/15  THN CM Short Term Goal #1 (0-30 days)  Member will attend follow up appointment with PCP within the next 2 weeks  THN CM Short Term Goal #1 Start Date  09/03/15  [facility] CM Short Term Goal #1 Met Date   09/23/15  Interventions for Short Term Goal #2   Discussed with member the importance of attending follow up appointment next week, confirmed appoinment through [facility], confirmed member has transportation.  THN CM Short Term Goal #2 (0-30 days)  Member will attend appointment with Pulmonologist within the next 2 weeks  THN CM Short Term Goal #2 Start Date  09/10/15  Interventions for Short Term Goal #2  Reminded member of appointment Friday,  9/15, at 3pm.  Confirmed with member payment plan and transportation.  Advised again the importance of keeping and attending appointment to manage COPD     [name], RN, MSN [place] Manager [phone]

## 2015-10-01 ENCOUNTER — Institutional Professional Consult (permissible substitution): Payer: Commercial Managed Care - HMO | Admitting: Internal Medicine

## 2015-10-04 ENCOUNTER — Other Ambulatory Visit: Payer: Self-pay | Admitting: *Deleted

## 2015-10-04 NOTE — Patient Outreach (Signed)
[facility] [facility]) Care Management  10/04/2015  DESA RECH [birth year] [id number]   Noted per chart that member again missed pulmonology appointment scheduled for Friday 9/15.  Call placed to member to follow up on missed appointment, no answer.  Appointment has been rescheduled for 10/6, noting that the appointment is important for her to attend as she has already had 2 "no shows."  Will await call back, if no call back will follow up in the next 2 weeks, after scheduled appointment.    Social worker at PCP office, Casimer Lanius, notified of missed appointment and new appointment date.  [name], [place], MSN [place] [phone]

## 2015-10-05 ENCOUNTER — Other Ambulatory Visit: Payer: Self-pay | Admitting: Family Medicine

## 2015-10-13 ENCOUNTER — Telehealth: Payer: Self-pay | Admitting: Licensed Clinical Social Worker

## 2015-10-13 NOTE — Progress Notes (Signed)
Follow up phone call to patient reference upcoming Pulmonology appointment.  The following was discussed: previous missed appointment, money order for her co-pay, and any possible barriers to appointment on 10/22/15.    Plan: Patient plans to go to the appointment on 10/22/15 and has not expressed any concerns or barriers that will prevent her from going to the appointment.     Casimer Lanius, LCSW Licensed Clinical Social Worker [place]   [phone] 3:19 PM

## 2015-10-14 ENCOUNTER — Ambulatory Visit (HOSPITAL_COMMUNITY): Payer: Self-pay | Admitting: Psychiatry

## 2015-10-18 ENCOUNTER — Other Ambulatory Visit (HOSPITAL_COMMUNITY): Payer: Self-pay

## 2015-10-18 DIAGNOSIS — F200 Paranoid schizophrenia: Secondary | ICD-10-CM

## 2015-10-18 MED ORDER — MIRTAZAPINE 30 MG PO TABS: 30.0000 mg | ORAL_TABLET | Freq: Every day | ORAL | 0 refills | Status: DC

## 2015-10-18 MED ORDER — ZIPRASIDONE HCL 80 MG PO CAPS: 160.0000 mg | ORAL_CAPSULE | Freq: Every day | ORAL | 0 refills | Status: DC

## 2015-10-18 MED ORDER — ESCITALOPRAM OXALATE 20 MG PO TABS: 20.0000 mg | ORAL_TABLET | Freq: Every day | ORAL | 0 refills | Status: DC

## 2015-10-20 ENCOUNTER — Other Ambulatory Visit: Payer: Self-pay | Admitting: Family Medicine

## 2015-10-21 ENCOUNTER — Other Ambulatory Visit: Payer: Self-pay | Admitting: *Deleted

## 2015-10-21 NOTE — Patient Outreach (Signed)
[place]) Care Management  10/21/2015  ANAYELI AREL 1960-05-02 [id number]   Call placed to member to provide reminder of appointment with pulmonology tomorrow as she has had 2 previous no shows.  No answer, HIPAA compliant voice message left.  Will follow up next week regarding appointment.  [name], [place], MSN [place] [phone]

## 2015-10-22 ENCOUNTER — Ambulatory Visit: Payer: Self-pay | Admitting: *Deleted

## 2015-10-22 ENCOUNTER — Institutional Professional Consult (permissible substitution): Payer: Commercial Managed Care - HMO | Admitting: Internal Medicine

## 2015-10-25 ENCOUNTER — Encounter: Payer: Self-pay | Admitting: Family Medicine

## 2015-10-25 ENCOUNTER — Encounter: Payer: Self-pay | Admitting: *Deleted

## 2015-10-25 ENCOUNTER — Other Ambulatory Visit: Payer: Self-pay | Admitting: *Deleted

## 2015-10-25 DIAGNOSIS — J189 Pneumonia, unspecified organism: Secondary | ICD-10-CM | POA: Diagnosis not present

## 2015-10-25 DIAGNOSIS — R062 Wheezing: Secondary | ICD-10-CM | POA: Diagnosis not present

## 2015-10-25 DIAGNOSIS — J45909 Unspecified asthma, uncomplicated: Secondary | ICD-10-CM | POA: Diagnosis not present

## 2015-10-25 DIAGNOSIS — R0602 Shortness of breath: Secondary | ICD-10-CM | POA: Diagnosis not present

## 2015-10-25 DIAGNOSIS — G4733 Obstructive sleep apnea (adult) (pediatric): Secondary | ICD-10-CM | POA: Diagnosis not present

## 2015-10-25 DIAGNOSIS — J440 Chronic obstructive pulmonary disease with acute lower respiratory infection: Secondary | ICD-10-CM | POA: Diagnosis not present

## 2015-10-25 NOTE — Patient Outreach (Signed)
[facility]) Care Management  10/25/2015  NASTASSIA BAZALDUA [birth year] [id number]   Noted per chart that member did not attend pulmonology office visit on Friday, 10/6.  Call placed to member to follow up on missed appointment, no answer.  HIPAA compliant voice message left.  Family Customer service manager, D. Laurance Flatten, notified of missed appointment as money order was provided for copay.  Will await call back.  This makes the 3rd consecutive unsuccessful attempt to contact member.  Will send outreach letter, if no answer within 10 days will close case.  [name], [place], MSN [place] [phone]

## 2015-10-26 ENCOUNTER — Telehealth: Payer: Self-pay | Admitting: Licensed Clinical Social Worker

## 2015-10-26 NOTE — Progress Notes (Signed)
LCSW informed by Brayton Layman [facility] Care Manager that patient missed 3rd pulmonology appointment.  Call to patient to make arrangements for her to return the money order.  Left message for patient to call LCSW.  Plan:  LCSW will follow up with patient within the next 24 hours if no return call is received.    Casimer Lanius, LCSW Licensed Clinical Social Worker [facility]   [phone] 3:55 PM

## 2015-10-29 ENCOUNTER — Telehealth: Payer: Self-pay | Admitting: Licensed Clinical Social Worker

## 2015-10-29 NOTE — Progress Notes (Signed)
Call to patient to discuss missed appointment with Pulmonology and returning the money order.  Patient states that she will reschedule her appointment and does not want to return the money order at this time. Patient understands the money order will need to be returned or it will be canceled if she does not go to the next appointment.  Plan: LCSW will continue to check system for new appointment day and time.    Casimer Lanius, LCSW Licensed Clinical Social Worker [place]   [phone] 4:25 PM

## 2015-11-02 ENCOUNTER — Ambulatory Visit (INDEPENDENT_AMBULATORY_CARE_PROVIDER_SITE_OTHER): Payer: Commercial Managed Care - HMO | Admitting: Family Medicine

## 2015-11-02 ENCOUNTER — Encounter: Payer: Self-pay | Admitting: Family Medicine

## 2015-11-02 VITALS — BP 107/61 | HR 60 | Temp 97.9°F | Wt 229.0 lb

## 2015-11-02 DIAGNOSIS — I10 Essential (primary) hypertension: Secondary | ICD-10-CM

## 2015-11-02 DIAGNOSIS — F319 Bipolar disorder, unspecified: Secondary | ICD-10-CM | POA: Diagnosis not present

## 2015-11-02 DIAGNOSIS — J449 Chronic obstructive pulmonary disease, unspecified: Secondary | ICD-10-CM | POA: Diagnosis not present

## 2015-11-02 MED ORDER — ALBUTEROL SULFATE HFA 108 (90 BASE) MCG/ACT IN AERS: 2.0000 | INHALATION_SPRAY | Freq: Four times a day (QID) | RESPIRATORY_TRACT | 6 refills | Status: DC | PRN

## 2015-11-02 MED ORDER — GABAPENTIN 600 MG PO TABS: 600.0000 mg | ORAL_TABLET | Freq: Three times a day (TID) | ORAL | 0 refills | Status: DC

## 2015-11-02 MED ORDER — LISINOPRIL 40 MG PO TABS: 40.0000 mg | ORAL_TABLET | Freq: Every day | ORAL | 0 refills | Status: DC

## 2015-11-02 MED ORDER — FLUTICASONE FUROATE-VILANTEROL 100-25 MCG/INH IN AEPB: INHALATION_SPRAY | RESPIRATORY_TRACT | 0 refills | Status: DC

## 2015-11-02 MED ORDER — METFORMIN HCL ER 500 MG PO TB24: 500.0000 mg | ORAL_TABLET | Freq: Two times a day (BID) | ORAL | 0 refills | Status: DC

## 2015-11-02 MED ORDER — SYMBICORT 160-4.5 MCG/ACT IN AERO: 2.0000 | INHALATION_SPRAY | Freq: Every day | RESPIRATORY_TRACT | 3 refills | Status: DC

## 2015-11-02 MED ORDER — ASPIRIN 81 MG PO TBEC: 81.0000 mg | DELAYED_RELEASE_TABLET | Freq: Every day | ORAL | 0 refills | Status: DC

## 2015-11-02 MED ORDER — ATORVASTATIN CALCIUM 40 MG PO TABS: 40.0000 mg | ORAL_TABLET | Freq: Every day | ORAL | 4 refills | Status: DC

## 2015-11-02 MED ORDER — TIOTROPIUM BROMIDE MONOHYDRATE 18 MCG IN CAPS: 18.0000 ug | ORAL_CAPSULE | Freq: Every day | RESPIRATORY_TRACT | 4 refills | Status: DC

## 2015-11-02 MED ORDER — AMLODIPINE BESYLATE 5 MG PO TABS
10.0000 mg | ORAL_TABLET | Freq: Every day | ORAL | 0 refills | Status: DC
Start: 2015-11-02 — End: 2015-12-05

## 2015-11-02 MED ORDER — VERAPAMIL HCL ER 240 MG PO CP24: 480.0000 mg | ORAL_CAPSULE | Freq: Every day | ORAL | 0 refills | Status: DC

## 2015-11-02 NOTE — Assessment & Plan Note (Signed)
Stable without any side effects. Continue current regimen.

## 2015-11-02 NOTE — Assessment & Plan Note (Signed)
Patient seems to be stable, however not improved with her current COPD regimen. She's no longer on supplemental oxygen at home. [name] satting well on room air here in the clinic today. [name] no increased work of breathing, wheezing, rhonchi, or crackles but is noted to have some decreased lung sounds in the bases. We discussed the importance of her taking possibility for her own care. -We'll refer back to pulmonology. I stressed that if they were to accept her for another appointment, it is imperative that she does. I noted that if she no showed for another appointment, they would most likely not give her another one. Additionally, I noted that our social worker has contacted me and noted that if she does not show up for her next appointment, we will cancel the money order that was provided for her co-pay. The patient voices understanding. -Continue with Symbicort, Spiriva, and 30. Discussed that the goal would be to decrease the amount of albuterol use. -Discussed the most important thing is for smoking cessation. Patient is still in the contemplative phase. I discussed meeting with Dr. Marchia Bond about starting Chantix -The patient was advised to follow-up in one month or sooner as needed

## 2015-11-02 NOTE — Patient Instructions (Addendum)
Our office, or the pulmonology office, will contact you concerning a potential appointment. If we are able to get she will an appointment with the pulmonologist, you must attend that appointment. If you failed to attend that appointment, the money order for the co-pay will be canceled and our office will be unable to provide you with other financial assistance. It is important to remain drug free.  Continue using Symbicort  2 puffs twice daily Spiriva 2 puffs (1 capsule) daily  Breo 1 puff daily. Albuterol as needed.   Ask Dr. Marchia Bond about Chantix. Keep decreasing the amount you're smoking.

## 2015-11-02 NOTE — Assessment & Plan Note (Signed)
The patient was advised to make a follow-up appointment with her psychiatrist and to keep that appointment.
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
## 2015-11-04 ENCOUNTER — Other Ambulatory Visit: Payer: Self-pay | Admitting: Family Medicine

## 2015-11-08 ENCOUNTER — Encounter: Payer: Self-pay | Admitting: *Deleted

## 2015-11-08 ENCOUNTER — Other Ambulatory Visit: Payer: Self-pay | Admitting: *Deleted

## 2015-11-08 NOTE — Patient Outreach (Signed)
[facility] [facility]) Care Management  11/08/2015  Tiffany [name] November 03, 1960 [id number]   Outreach letter sent on 10/9, no response.  Will send member and PCP case closure notification.  Will notify care management assistant of case closure.  [name], [place], MSN [place] [phone]

## 2015-11-09 ENCOUNTER — Other Ambulatory Visit: Payer: Self-pay | Admitting: Family Medicine

## 2015-11-09 ENCOUNTER — Other Ambulatory Visit: Payer: Self-pay | Admitting: Internal Medicine

## 2015-11-09 NOTE — Telephone Encounter (Signed)
Spoke with pt she says she doesn't need it, pharmacy told her she needed it when she called to request other medicines. Tiffany [name] Kennon Holter, CMA

## 2015-11-11 ENCOUNTER — Telehealth: Payer: Self-pay | Admitting: *Deleted

## 2015-11-11 NOTE — Telephone Encounter (Signed)
[employer] calls to verify that pt is to be on both Brio and Spiriva.  Will forward to MD to clarify.  Please return call to [facility] to clarify. Tiffany [name], [name], [place]

## 2015-11-12 ENCOUNTER — Telehealth: Payer: Self-pay

## 2015-11-12 NOTE — Telephone Encounter (Signed)
Excellent. Thank you.  Archie Patten, MD [facility] Resident  11/12/2015, 1:42 PM

## 2015-11-12 NOTE — Telephone Encounter (Signed)
Dr. Lorenso Courier spoke to Legrand Como from [facility] who requested that a new prescription for Albuterol nebulizer by updated. The one they have has expired and was last filled on April 30. I will call them to let them know that the Brio Elipta was discontinued and the patient is still taking Symbicort which was a question of concern as well.  Thanks.Tiffany [name]

## 2015-11-16 ENCOUNTER — Other Ambulatory Visit (HOSPITAL_COMMUNITY): Payer: Self-pay | Admitting: Psychiatry

## 2015-11-16 DIAGNOSIS — F200 Paranoid schizophrenia: Secondary | ICD-10-CM

## 2015-11-18 ENCOUNTER — Other Ambulatory Visit (HOSPITAL_COMMUNITY): Payer: Self-pay | Admitting: Psychiatry

## 2015-11-18 NOTE — Telephone Encounter (Signed)
Refill ok for 90 days on 11/07/15

## 2015-11-23 ENCOUNTER — Other Ambulatory Visit: Payer: Self-pay | Admitting: *Deleted

## 2015-11-23 ENCOUNTER — Telehealth: Payer: Self-pay | Admitting: Family Medicine

## 2015-11-23 NOTE — Telephone Encounter (Signed)
Wynonia Lawman # [id number]

## 2015-11-23 NOTE — Patient Outreach (Signed)
[facility]) Care Management  11/23/2015  ALANTA SCOBEY 1960/06/19 [id number]   Member's case was closed on 10/23, letter sent.  Voice message received today from member requesting call back.  Call placed to member, no answer.  HIPAA compliant voice message left.  Will await call back.  Will not reopen case at this time.  [name], [place], MSN [place] [phone]

## 2015-11-23 NOTE — Telephone Encounter (Signed)
[facility] would like a silverback for the patient.  11/26/15 at 4 pm  Dr. Baltazar Apo  NPI [id number]  Diagnose J44.9  Tiffany [name]

## 2015-11-24 ENCOUNTER — Inpatient Hospital Stay (HOSPITAL_COMMUNITY)
Admission: EM | Admit: 2015-11-24 | Discharge: 2015-12-01 | DRG: 641 | Disposition: A | Discharge status: Other Acute Inpatient Hospital | Payer: Commercial Managed Care - HMO | Attending: Family Medicine | Admitting: Family Medicine

## 2015-11-24 ENCOUNTER — Telehealth: Payer: Self-pay | Admitting: Family Medicine

## 2015-11-24 ENCOUNTER — Encounter (HOSPITAL_COMMUNITY): Payer: Self-pay | Admitting: Emergency Medicine

## 2015-11-24 ENCOUNTER — Emergency Department (HOSPITAL_COMMUNITY): Payer: Commercial Managed Care - HMO

## 2015-11-24 DIAGNOSIS — Z882 Allergy status to sulfonamides status: Secondary | ICD-10-CM

## 2015-11-24 DIAGNOSIS — F1911 Other psychoactive substance abuse, in remission: Secondary | ICD-10-CM

## 2015-11-24 DIAGNOSIS — F1410 Cocaine abuse, uncomplicated: Secondary | ICD-10-CM

## 2015-11-24 DIAGNOSIS — E860 Dehydration: Secondary | ICD-10-CM | POA: Diagnosis present

## 2015-11-24 DIAGNOSIS — Z7951 Long term (current) use of inhaled steroids: Secondary | ICD-10-CM

## 2015-11-24 DIAGNOSIS — R45850 Homicidal ideations: Secondary | ICD-10-CM | POA: Diagnosis not present

## 2015-11-24 DIAGNOSIS — G4733 Obstructive sleep apnea (adult) (pediatric): Secondary | ICD-10-CM | POA: Diagnosis not present

## 2015-11-24 DIAGNOSIS — I25119 Atherosclerotic heart disease of native coronary artery with unspecified angina pectoris: Secondary | ICD-10-CM | POA: Diagnosis present

## 2015-11-24 DIAGNOSIS — I4581 Long QT syndrome: Secondary | ICD-10-CM | POA: Diagnosis present

## 2015-11-24 DIAGNOSIS — N179 Acute kidney failure, unspecified: Secondary | ICD-10-CM

## 2015-11-24 DIAGNOSIS — E876 Hypokalemia: Secondary | ICD-10-CM | POA: Diagnosis not present

## 2015-11-24 DIAGNOSIS — J441 Chronic obstructive pulmonary disease with (acute) exacerbation: Secondary | ICD-10-CM | POA: Diagnosis not present

## 2015-11-24 DIAGNOSIS — I10 Essential (primary) hypertension: Secondary | ICD-10-CM | POA: Diagnosis present

## 2015-11-24 DIAGNOSIS — I110 Hypertensive heart disease with heart failure: Secondary | ICD-10-CM | POA: Diagnosis not present

## 2015-11-24 DIAGNOSIS — Z8042 Family history of malignant neoplasm of prostate: Secondary | ICD-10-CM | POA: Diagnosis not present

## 2015-11-24 DIAGNOSIS — J440 Chronic obstructive pulmonary disease with acute lower respiratory infection: Secondary | ICD-10-CM | POA: Diagnosis not present

## 2015-11-24 DIAGNOSIS — F410 Panic disorder [episodic paroxysmal anxiety] without agoraphobia: Secondary | ICD-10-CM | POA: Diagnosis present

## 2015-11-24 DIAGNOSIS — E785 Hyperlipidemia, unspecified: Secondary | ICD-10-CM | POA: Diagnosis present

## 2015-11-24 DIAGNOSIS — D649 Anemia, unspecified: Secondary | ICD-10-CM | POA: Diagnosis present

## 2015-11-24 DIAGNOSIS — Z01810 Encounter for preprocedural cardiovascular examination: Secondary | ICD-10-CM | POA: Diagnosis not present

## 2015-11-24 DIAGNOSIS — Z7984 Long term (current) use of oral hypoglycemic drugs: Secondary | ICD-10-CM | POA: Diagnosis not present

## 2015-11-24 DIAGNOSIS — E1140 Type 2 diabetes mellitus with diabetic neuropathy, unspecified: Secondary | ICD-10-CM | POA: Diagnosis not present

## 2015-11-24 DIAGNOSIS — E8881 Metabolic syndrome: Secondary | ICD-10-CM | POA: Diagnosis not present

## 2015-11-24 DIAGNOSIS — Z9114 Patient's other noncompliance with medication regimen: Secondary | ICD-10-CM

## 2015-11-24 DIAGNOSIS — J45909 Unspecified asthma, uncomplicated: Secondary | ICD-10-CM | POA: Diagnosis not present

## 2015-11-24 DIAGNOSIS — Z818 Family history of other mental and behavioral disorders: Secondary | ICD-10-CM | POA: Diagnosis not present

## 2015-11-24 DIAGNOSIS — E871 Hypo-osmolality and hyponatremia: Secondary | ICD-10-CM | POA: Diagnosis not present

## 2015-11-24 DIAGNOSIS — R05 Cough: Secondary | ICD-10-CM | POA: Diagnosis not present

## 2015-11-24 DIAGNOSIS — F3113 Bipolar disorder, current episode manic without psychotic features, severe: Secondary | ICD-10-CM | POA: Diagnosis not present

## 2015-11-24 DIAGNOSIS — Z809 Family history of malignant neoplasm, unspecified: Secondary | ICD-10-CM

## 2015-11-24 DIAGNOSIS — J189 Pneumonia, unspecified organism: Secondary | ICD-10-CM | POA: Diagnosis not present

## 2015-11-24 DIAGNOSIS — E878 Other disorders of electrolyte and fluid balance, not elsewhere classified: Secondary | ICD-10-CM

## 2015-11-24 DIAGNOSIS — F28 Other psychotic disorder not due to a substance or known physiological condition: Secondary | ICD-10-CM | POA: Diagnosis not present

## 2015-11-24 DIAGNOSIS — F1423 Cocaine dependence with withdrawal: Secondary | ICD-10-CM | POA: Diagnosis present

## 2015-11-24 DIAGNOSIS — D72829 Elevated white blood cell count, unspecified: Secondary | ICD-10-CM | POA: Diagnosis present

## 2015-11-24 DIAGNOSIS — F3164 Bipolar disorder, current episode mixed, severe, with psychotic features: Secondary | ICD-10-CM | POA: Diagnosis present

## 2015-11-24 DIAGNOSIS — R45851 Suicidal ideations: Secondary | ICD-10-CM | POA: Diagnosis not present

## 2015-11-24 DIAGNOSIS — N184 Chronic kidney disease, stage 4 (severe): Secondary | ICD-10-CM | POA: Diagnosis present

## 2015-11-24 DIAGNOSIS — F29 Unspecified psychosis not due to a substance or known physiological condition: Secondary | ICD-10-CM | POA: Diagnosis not present

## 2015-11-24 DIAGNOSIS — Z7982 Long term (current) use of aspirin: Secondary | ICD-10-CM

## 2015-11-24 DIAGNOSIS — G2401 Drug induced subacute dyskinesia: Secondary | ICD-10-CM | POA: Diagnosis not present

## 2015-11-24 DIAGNOSIS — E669 Obesity, unspecified: Secondary | ICD-10-CM | POA: Diagnosis present

## 2015-11-24 DIAGNOSIS — T380X5A Adverse effect of glucocorticoids and synthetic analogues, initial encounter: Secondary | ICD-10-CM | POA: Diagnosis present

## 2015-11-24 DIAGNOSIS — I509 Heart failure, unspecified: Secondary | ICD-10-CM | POA: Diagnosis not present

## 2015-11-24 DIAGNOSIS — Z6841 Body Mass Index (BMI) 40.0 and over, adult: Secondary | ICD-10-CM | POA: Diagnosis not present

## 2015-11-24 DIAGNOSIS — R440 Auditory hallucinations: Secondary | ICD-10-CM | POA: Diagnosis not present

## 2015-11-24 DIAGNOSIS — F17210 Nicotine dependence, cigarettes, uncomplicated: Secondary | ICD-10-CM | POA: Diagnosis present

## 2015-11-24 DIAGNOSIS — F200 Paranoid schizophrenia: Secondary | ICD-10-CM

## 2015-11-24 DIAGNOSIS — F3160 Bipolar disorder, current episode mixed, unspecified: Secondary | ICD-10-CM | POA: Diagnosis not present

## 2015-11-24 DIAGNOSIS — I252 Old myocardial infarction: Secondary | ICD-10-CM | POA: Diagnosis not present

## 2015-11-24 DIAGNOSIS — Z888 Allergy status to other drugs, medicaments and biological substances status: Secondary | ICD-10-CM | POA: Diagnosis not present

## 2015-11-24 DIAGNOSIS — Z8673 Personal history of transient ischemic attack (TIA), and cerebral infarction without residual deficits: Secondary | ICD-10-CM | POA: Diagnosis not present

## 2015-11-24 DIAGNOSIS — R0602 Shortness of breath: Secondary | ICD-10-CM | POA: Diagnosis not present

## 2015-11-24 DIAGNOSIS — R062 Wheezing: Secondary | ICD-10-CM | POA: Diagnosis not present

## 2015-11-24 DIAGNOSIS — Z79899 Other long term (current) drug therapy: Secondary | ICD-10-CM

## 2015-11-24 DIAGNOSIS — F1420 Cocaine dependence, uncomplicated: Secondary | ICD-10-CM | POA: Diagnosis not present

## 2015-11-24 HISTORY — DX: Cocaine abuse, uncomplicated: F14.10

## 2015-11-24 LAB — URINE MICROSCOPIC-ADD ON
RBC / HPF: NONE SEEN RBC/hpf (ref 0–5)
WBC, UA: NONE SEEN WBC/hpf (ref 0–5)

## 2015-11-24 LAB — COMPREHENSIVE METABOLIC PANEL
ALT: 12 U/L — ABNORMAL LOW (ref 14–54)
AST: 18 U/L (ref 15–41)
Albumin: 3.4 g/dL — ABNORMAL LOW (ref 3.5–5.0)
Alkaline Phosphatase: 122 U/L (ref 38–126)
Anion gap: 11 (ref 5–15)
BUN: 12 mg/dL (ref 6–20)
CO2: 23 mmol/L (ref 22–32)
Calcium: 8.8 mg/dL — ABNORMAL LOW (ref 8.9–10.3)
Chloride: 92 mmol/L — ABNORMAL LOW (ref 101–111)
Creatinine, Ser: 1.65 mg/dL — ABNORMAL HIGH (ref 0.44–1.00)
GFR calc Af Amer: 39 mL/min — ABNORMAL LOW (ref >60)
GFR calc non Af Amer: 34 mL/min — ABNORMAL LOW (ref >60)
Glucose, Bld: 95 mg/dL (ref 65–99)
Potassium: 2.9 mmol/L — ABNORMAL LOW (ref 3.5–5.1)
Sodium: 126 mmol/L — ABNORMAL LOW (ref 135–145)
Total Bilirubin: 0.9 mg/dL (ref 0.3–1.2)
Total Protein: 6.5 g/dL (ref 6.5–8.1)

## 2015-11-24 LAB — RAPID URINE DRUG SCREEN, HOSP PERFORMED
Amphetamines: NOT DETECTED
Barbiturates: NOT DETECTED
Benzodiazepines: NOT DETECTED
Cocaine: POSITIVE — AB
Opiates: NOT DETECTED
Tetrahydrocannabinol: POSITIVE — AB

## 2015-11-24 LAB — CBC WITH DIFFERENTIAL/PLATELET
Basophils Absolute: 0 10*3/uL (ref 0.0–0.1)
Basophils Relative: 0 %
Eosinophils Absolute: 0.1 10*3/uL (ref 0.0–0.7)
Eosinophils Relative: 1 %
HCT: 34.7 % — ABNORMAL LOW (ref 36.0–46.0)
Hemoglobin: 11.7 g/dL — ABNORMAL LOW (ref 12.0–15.0)
Lymphocytes Relative: 15 %
Lymphs Abs: 2.4 10*3/uL (ref 0.7–4.0)
MCH: 27.7 pg (ref 26.0–34.0)
MCHC: 33.7 g/dL (ref 30.0–36.0)
MCV: 82 fL (ref 78.0–100.0)
Monocytes Absolute: 0.8 10*3/uL (ref 0.1–1.0)
Monocytes Relative: 5 %
Neutro Abs: 12.8 10*3/uL — ABNORMAL HIGH (ref 1.7–7.7)
Neutrophils Relative %: 79 %
Platelets: 438 10*3/uL — ABNORMAL HIGH (ref 150–400)
RBC: 4.23 MIL/uL (ref 3.87–5.11)
RDW: 16.1 % — ABNORMAL HIGH (ref 11.5–15.5)
WBC: 16.2 10*3/uL — ABNORMAL HIGH (ref 4.0–10.5)

## 2015-11-24 LAB — URINALYSIS, ROUTINE W REFLEX MICROSCOPIC
Bilirubin Urine: NEGATIVE
Glucose, UA: NEGATIVE mg/dL
Ketones, ur: NEGATIVE mg/dL
Leukocytes, UA: NEGATIVE
Nitrite: NEGATIVE
Protein, ur: NEGATIVE mg/dL
Specific Gravity, Urine: 1.004 — ABNORMAL LOW (ref 1.005–1.030)
pH: 6 (ref 5.0–8.0)

## 2015-11-24 LAB — ACETAMINOPHEN LEVEL: Acetaminophen (Tylenol), Serum: <10 ug/mL — ABNORMAL LOW (ref 10–30)

## 2015-11-24 LAB — SALICYLATE LEVEL: Salicylate Lvl: <7 mg/dL (ref 2.8–30.0)

## 2015-11-24 LAB — ETHANOL: Alcohol, Ethyl (B): <5 mg/dL (ref <5)

## 2015-11-24 LAB — BRAIN NATRIURETIC PEPTIDE: B Natriuretic Peptide: 68.9 pg/mL (ref 0.0–100.0)

## 2015-11-24 MED ORDER — ALBUTEROL SULFATE (2.5 MG/3ML) 0.083% IN NEBU
5.0000 mg | INHALATION_SOLUTION | Freq: Once | RESPIRATORY_TRACT | Status: AC
  Administered 2015-11-24: 5 mg via RESPIRATORY_TRACT
  Filled 2015-11-24: qty 6

## 2015-11-24 MED ORDER — LORAZEPAM 1 MG PO TABS
1.0000 mg | ORAL_TABLET | Freq: Once | ORAL | Status: AC
  Administered 2015-11-24: 1 mg via ORAL
  Filled 2015-11-24: qty 1

## 2015-11-24 MED ORDER — AZITHROMYCIN 250 MG PO TABS
1000.0000 mg | ORAL_TABLET | Freq: Once | ORAL | Status: AC
  Administered 2015-11-24: 1000 mg via ORAL
  Filled 2015-11-24: qty 4

## 2015-11-24 MED ORDER — IPRATROPIUM BROMIDE 0.02 % IN SOLN
0.5000 mg | Freq: Once | RESPIRATORY_TRACT | Status: AC
  Administered 2015-11-24: 0.5 mg via RESPIRATORY_TRACT
  Filled 2015-11-24: qty 2.5

## 2015-11-24 MED ORDER — POTASSIUM CHLORIDE CRYS ER 20 MEQ PO TBCR
20.0000 meq | EXTENDED_RELEASE_TABLET | Freq: Once | ORAL | Status: AC
  Administered 2015-11-24: 20 meq via ORAL
  Filled 2015-11-24: qty 1

## 2015-11-24 MED ORDER — SODIUM CHLORIDE 0.9 % IV SOLN
30.0000 meq | Freq: Once | INTRAVENOUS | Status: AC
  Administered 2015-11-24: 30 meq via INTRAVENOUS
  Filled 2015-11-24: qty 15

## 2015-11-24 MED ORDER — LIDOCAINE HCL (PF) 1 % IJ SOLN
INTRAMUSCULAR | Status: AC
  Filled 2015-11-24: qty 5

## 2015-11-24 MED ORDER — METHYLPREDNISOLONE SODIUM SUCC 125 MG IJ SOLR
125.0000 mg | Freq: Once | INTRAMUSCULAR | Status: AC
  Administered 2015-11-24: 125 mg via INTRAVENOUS
  Filled 2015-11-24: qty 2

## 2015-11-24 MED ORDER — CEFTRIAXONE SODIUM 250 MG IJ SOLR
250.0000 mg | Freq: Once | INTRAMUSCULAR | Status: AC
  Administered 2015-11-24: 250 mg via INTRAMUSCULAR
  Filled 2015-11-24: qty 250

## 2015-11-24 MED ORDER — SODIUM CHLORIDE 0.9 % IV BOLUS (SEPSIS)
1000.0000 mL | Freq: Once | INTRAVENOUS | Status: AC
  Administered 2015-11-24: 1000 mL via INTRAVENOUS

## 2015-11-24 NOTE — ED Provider Notes (Signed)
[facility] DEPT Provider Note   CSN: [id number] Arrival date & time: 11/24/15  1850   History   Chief Complaint Chief Complaint  Patient presents with  . Addiction Problem  . Hallucinations    HPI Tiffany [name] is a 55 y.o. female.  HPI   Patient is a 72 old female with past medical history pertinent for cocaine abuse, manic depression, COPD, CHF, diabetes, hypertension, sleep apnea, CAD, HLD, she states she has past medical history of schizophrenia, she presents emergency department, brought in by her son because he believes she has a danger to herself and others, she is be using cocaine and marijuana, last use was 3 hours ago. She has increasing paranoia, expresses felt "scared" and feeling as if she "needs to run."  [name] agitated, does not specifically confirm or deny AVH, but just repeats over and over that she is scared.  She denies suicidal ideations, homicidal ideations, but she is afraid that her paranoia may cause her to hurt someone.  She has been taking her medication for her psychiatric illness however she cannot state what it is. Her increased paranoia and agitation this occurred after using crack cocaine frequently.  She states, "my mind's not right."  She reports 2 days of worsening shortness of breath with wheeze and productive sputum which she describes as "sticky mucous", denies hemoptysis, fever, sweats.  Cough, sputum production and shortness of breath and wheeze or increased from her baseline, unimproved with home nebulizer treatments.  Family members at the bedside state that she seems generally worse after an admission in August she did complete her treatment of doxycycline and she was treated for congestive heart failure at the time. She has unchanged orthopnea and lower extremity edema. She denies chest pain, palpitations, near syncope, headache, blurry vision, abdominal pain. She requests testing and treatment for STDs because of having a protected sex 90 days  ago.  She denies vaginal discharge, vaginal irritation, general for rash, pelvic pain, dysuria or hematuria.  She states she's been eating and drinking normally with normal urination. She denies any nausea vomiting or diarrhea.    Past Medical History:  Diagnosis Date  . Anxiety   . Asthma   . Cocaine abuse   . Complication of anesthesia    decreased bp, decreased heart rate  . Diabetes mellitus    class 2  . Disorder of nervous system   . Emphysema   . Heart attack   . High blood pressure   . High cholesterol   . Hypertension   . Incontinence   . Manic depression ([place])   . Myocardial infarct   . Pneumonia 06/2015  . Seasonal allergies   . Seizures ([place])   . Sinus trouble   . Sleep apnea   . Stroke [facility])     Patient Active Problem List   Diagnosis Date Noted  . AKI (acute kidney injury) ([place]) 11/24/2015  . Blood in sputum   . Acute on chronic congestive heart failure ([place])   . SOB (shortness of breath)   . Hyperlipidemia   . Hypoxia 08/19/2015  . COPD with acute exacerbation ([place]) 08/19/2015  . Acute on chronic respiratory failure with hypoxia and hypercapnia ([place]) 06/22/2015  . Acute respiratory failure with hypoxemia ([place]) 06/22/2015  . Scabies 06/01/2015  . Onychomycosis 01/21/2015  . Nasal congestion 12/17/2014  . Type 2 diabetes mellitus with complication ([place])   . Tobacco abuse 07/22/2014  . COPD (chronic obstructive pulmonary disease) ([place]) 07/08/2014  .  COPD exacerbation (HCC) 06/26/2014  . Seborrheic keratoses 12/31/2013  . Thrush 09/19/2013  . Knee pain, bilateral 01/22/2013  . Seizure (HCC) 01/04/2013  . Health care maintenance 11/25/2012  . Folliculitis 11/25/2012  . History of kidney stones 06/18/2012  . Chronic pain syndrome 06/18/2012  . Muscle spasm 06/18/2012  . Elevated troponin 04/28/2012  . Asthma with acute exacerbation 04/28/2012  . Allergic reaction 04/07/2012  . Bipolar 1 disorder (HCC) 12/08/2011  . Itching 09/06/2011  . HSV  infection 08/30/2011  . Dyslipidemia 04/24/2011  . Anemia 04/24/2011  . Depression with anxiety 04/24/2011  . Diabetic neuropathy (HCC) 04/24/2011  . OSA and COPD overlap syndrome (HCC) 10/18/2010  . Asthma 10/18/2010  . Obesity 10/18/2010  . HTN (hypertension) 10/18/2010  . Type 2 diabetes mellitus (HCC) 10/18/2010    Past Surgical History:  Procedure Laterality Date  . ABDOMINAL HYSTERECTOMY    . CESAREAN SECTION    . HIATAL HERNIA REPAIR      OB History    No data available       Home Medications    Prior to Admission medications   Medication Sig Start Date End Date Taking? Authorizing Provider  ACCU-CHEK SOFTCLIX LANCETS lancets  07/28/14   Historical Provider, MD  albuterol (PROVENTIL HFA;VENTOLIN HFA) 108 (90 Base) MCG/ACT inhaler Inhale 2 puffs into the lungs every 6 (six) hours as needed. 11/02/15   Crystal S Dorsey, MD  albuterol (PROVENTIL) (2.5 MG/3ML) 0.083% nebulizer solution Take 3 mLs (2.5 mg total) by nebulization every 6 (six) hours as needed for wheezing or shortness of breath. 10/20/14   Crystal S Dorsey, MD  amLODipine (NORVASC) 5 MG tablet Take 2 tablets (10 mg total) by mouth daily. 11/02/15   Crystal S Dorsey, MD  aspirin 81 MG EC tablet Take 1 tablet (81 mg total) by mouth daily. Swallow whole. 11/02/15   Crystal S Dorsey, MD  atorvastatin (LIPITOR) 40 MG tablet Take 1 tablet (40 mg total) by mouth daily. 11/02/15   Crystal S Dorsey, MD  Blood Glucose Monitoring Suppl (ACCU-CHEK AVIVA PLUS) w/Device KIT To check CBGs three times daily 09/30/15   Crystal S Dorsey, MD  escitalopram (LEXAPRO) 20 MG tablet Take 1 tablet (20 mg total) by mouth daily. 10/18/15   Syed T Arfeen, MD  fluticasone furoate-vilanterol (BREO ELLIPTA) 100-25 MCG/INH AEPB INHALE 1 PUFF EVERY DAY 11/02/15   Crystal S Dorsey, MD  furosemide (LASIX) 40 MG tablet Take 1 tablet (40 mg total) by mouth daily. Take Lasix twice daily from 7/14 until 7/18, then daily 07/29/15   Crystal S Dorsey, MD    gabapentin (NEURONTIN) 600 MG tablet Take 1 tablet (600 mg total) by mouth 3 (three) times daily. 11/02/15   Crystal S Dorsey, MD  glucose blood test strip Use to check blood sugar 3 times daily. 07/05/15   Crystal S Dorsey, MD  lisinopril (PRINIVIL,ZESTRIL) 40 MG tablet Take 1 tablet (40 mg total) by mouth daily. 11/02/15   Crystal S Dorsey, MD  loratadine (CLARITIN) 10 MG tablet Take 1 tablet (10 mg total) by mouth daily. 07/26/15   Crystal S Dorsey, MD  metFORMIN (GLUCOPHAGE-XR) 500 MG 24 hr tablet Take 1 tablet (500 mg total) by mouth 2 (two) times daily. 11/02/15   Crystal S Dorsey, MD  mirtazapine (REMERON) 30 MG tablet Take 1 tablet (30 mg total) by mouth at bedtime. 10/18/15   Syed T Arfeen, MD  montelukast (SINGULAIR) 10 MG tablet Take 1 tablet (10 mg total) by mouth at bedtime.   07/26/15   Crystal S Dorsey, MD  Multiple Vitamins-Minerals (MULTIVITAMIN WITH MINERALS) tablet Take 1 tablet by mouth daily. Reported on 07/28/2015    Historical Provider, MD  naproxen (NAPROSYN) 500 MG tablet Take 1 tablet (500 mg total) by mouth 3 (three) times daily as needed. Patient taking differently: Take 500 mg by mouth 3 (three) times daily as needed for mild pain.  07/26/15   Crystal S Dorsey, MD  nicotine polacrilex ([employer] NICOTINE POLACRILEX) 2 MG gum Take 1 each (2 mg total) by mouth as needed for smoking cessation. Do not exceed 24 pieces in a day. Patient not taking: Reported on 09/22/2015 08/22/15   Kanishka G Gunadasa, MD  omeprazole (PRILOSEC) 40 MG capsule Take 1 capsule (40 mg total) by mouth daily. 07/01/15   Crystal S Dorsey, MD  oxybutynin (DITROPAN) 5 MG tablet Take 1 tablet (5 mg total) by mouth 3 (three) times daily. 04/15/15   Crystal S Dorsey, MD  potassium chloride (K-DUR,KLOR-CON) 10 MEQ tablet Take 2 tablets (20 mEq total) by mouth daily. Patient taking differently: Take 10 mEq by mouth 2 (two) times daily.  07/26/15   Crystal S Dorsey, MD  SYMBICORT 160-4.5 MCG/ACT inhaler INHALE 2 PUFFS INTO THE  LUNGS DAILY. 11/05/15   Crystal S Dorsey, MD  tiotropium (SPIRIVA HANDIHALER) 18 MCG inhalation capsule Place 1 capsule (18 mcg total) into inhaler and inhale daily. 11/02/15   Crystal S Dorsey, MD  verapamil (VERELAN PM) 240 MG 24 hr capsule Take 2 capsules (480 mg total) by mouth at bedtime. 11/02/15   Crystal S Dorsey, MD  ziprasidone (GEODON) 80 MG capsule Take 2 capsules (160 mg total) by mouth at bedtime. 10/18/15   Syed T Arfeen, MD    Family History Family History  Problem Relation Age of Onset  . Cancer Father     prostate  . Cancer Mother     lung  . Depression Mother   . Depression Sister   . Anxiety disorder Sister   . Schizophrenia Sister   . Bipolar disorder Sister   . Depression Sister   . Depression Brother   . Heart failure      cousin    Social History Social History  Substance Use Topics  . Smoking status: Current Every Day Smoker    Packs/day: 0.50    Years: 37.00    Types: Cigarettes    Start date: 01/16/1977  . Smokeless tobacco: Never Used     Comment: Previous 2 PPD menthol full flavor  . Alcohol use No     Allergies   Hydroxyzine; Codeine; and Sulfa antibiotics   Review of Systems Review of Systems  All other systems reviewed and are negative.    Physical Exam Updated Vital Signs BP 155/82   Pulse 81   Temp 98.2 F (36.8 C) (Oral)   Resp 24   Ht 5' 4" (1.626 m)   Wt 107.5 kg   SpO2 92%   BMI 40.68 kg/m   Physical Exam  Constitutional: [name] oriented to person, place, and time. She appears well-developed and well-nourished. No distress.  HENT:  Head: Normocephalic and atraumatic.  Right Ear: External ear normal.  Left Ear: External ear normal.  Nose: Nose normal.  Mouth/Throat: No oropharyngeal exudate.  Oral mucosa matted and dry, uvula midline, OP normal, epiglottis visible  Eyes: Conjunctivae and EOM are normal. Pupils are equal, round, and reactive to light. Right eye exhibits no discharge. Left eye exhibits no discharge.  No scleral icterus.    Neck: Normal range of motion. Neck supple. No JVD present. No tracheal deviation present.  Cardiovascular: Normal rate, regular rhythm, normal heart sounds and intact distal pulses.  Exam reveals no gallop and no friction rub.   No murmur heard. Regular rate and rhythm, no murmur, gallop or rub, 1+ bilateral pitting edema, no JVD, no carotid bruits auscultated, bounding radial pulses bilaterally  Pulmonary/Chest: Effort normal. No accessory muscle usage or stridor. Tachypnea (mildly tachypneic) noted. No respiratory distress. She has decreased breath sounds. She has wheezes. She has rhonchi. She has no rales. She exhibits no tenderness.  BS diminished bilaterally at the bases, scattered rhonchi, diffuse expiratory wheeze, pt mildly tachypneic (appears to hyperventilate when being examined and when talking with patient), no accessory muscle use  Abdominal: Soft. Normal appearance and bowel sounds are normal. She exhibits no distension and no mass. There is no tenderness. There is no rebound and no guarding.  Obese abdomen, soft, nontender, nondistended, normal bowel sounds 4, no CVA tenderness  Musculoskeletal: Normal range of motion. She exhibits no edema.  Lymphadenopathy:    She has no cervical adenopathy.  Neurological: [name] alert and oriented to person, place, and time. She displays tremor (pt shaking left hand and wiggling both feet, appears intentional and related to anxiety, no asterixis). She exhibits normal muscle tone. Coordination normal. GCS eye subscore is 4. GCS verbal subscore is 5. GCS motor subscore is 6.  Moves all extremities with normal coordination, follow simple commands  Skin: Skin is warm and dry. Capillary refill takes less than 2 seconds. No rash noted. [name] not diaphoretic. No erythema. No pallor.  Psychiatric: Her mood appears anxious. Her speech is rapid and/or pressured. [name] agitated and hyperactive. [name] not actively hallucinating. Thought  content is paranoid and delusional. She expresses impulsivity. She expresses no homicidal and no suicidal ideation. She expresses no suicidal plans and no homicidal plans.  Nursing note and vitals reviewed.    ED Treatments / Results  Labs (all labs ordered are listed, but only abnormal results are displayed) Labs Reviewed  COMPREHENSIVE METABOLIC PANEL - Abnormal; Notable for the following:       Result Value   Sodium 126 (*)    Potassium 2.9 (*)    Chloride 92 (*)    Creatinine, Ser 1.65 (*)    Calcium 8.8 (*)    Albumin 3.4 (*)    ALT 12 (*)    GFR calc non Af Amer 34 (*)    GFR calc Af Amer 39 (*)    All other components within normal limits  CBC WITH DIFFERENTIAL/PLATELET - Abnormal; Notable for the following:    WBC 16.2 (*)    Hemoglobin 11.7 (*)    HCT 34.7 (*)    RDW 16.1 (*)    Platelets 438 (*)    Neutro Abs 12.8 (*)    All other components within normal limits  RAPID URINE DRUG SCREEN, HOSP PERFORMED - Abnormal; Notable for the following:    Cocaine POSITIVE (*)    Tetrahydrocannabinol POSITIVE (*)    All other components within normal limits  ACETAMINOPHEN LEVEL - Abnormal; Notable for the following:    Acetaminophen (Tylenol), Serum <10 (*)    All other components within normal limits  URINALYSIS, ROUTINE W REFLEX MICROSCOPIC (NOT AT [facility]) - Abnormal; Notable for the following:    Specific Gravity, Urine 1.004 (*)    Hgb urine dipstick TRACE (*)    All other components within normal limits  URINE MICROSCOPIC-ADD ON - Abnormal; Notable for the following:    Squamous Epithelial / LPF 0-5 (*)    Bacteria, UA RARE (*)    All other components within normal limits  ETHANOL  SALICYLATE LEVEL  BRAIN NATRIURETIC PEPTIDE  GC/CHLAMYDIA PROBE AMP ([place]) NOT AT [facility]    EKG  EKG Interpretation  Date/Time:  Wednesday November 24 2015 20:59:34 EST Ventricular Rate:  84 PR Interval:    QRS Duration: 81 QT Interval:  434 QTC Calculation: 514 R  Axis:   37 Text Interpretation:  Sinus rhythm Prolonged QT interval Confirmed by Regenia Skeeter MD, SCOTT [phone]) on 11/24/2015 9:33:56 PM       Radiology Dg Chest 2 View  Result Date: 11/24/2015 CLINICAL DATA:  Shortness of breath, wheezing, productive cough for 1 year. History of COPD, emphysema, congestive heart failure, hypertension. EXAM: CHEST  2 VIEW COMPARISON:  08/19/2015 FINDINGS: Mild cardiac enlargement without pulmonary vascular congestion or edema. Previous right lung infiltrates have resolved. Lungs appear clear and expanded today. No focal consolidation or airspace disease. No blunting of costophrenic angles. No pneumothorax. Tortuous aorta. Degenerative changes in the spine. IMPRESSION: No active cardiopulmonary disease. Electronically Signed   By: Lucienne Capers M.D.   On: 11/24/2015 21:36    Procedures Procedures (including critical care time)  Medications Ordered in ED Medications  lidocaine (PF) (XYLOCAINE) 1 % injection (not administered)  potassium chloride 30 mEq in sodium chloride 0.9 % 265 mL (KCL MULTIRUN) IVPB (30 mEq Intravenous Given 11/24/15 2333)  albuterol (PROVENTIL) (2.5 MG/3ML) 0.083% nebulizer solution 2.5 mg (not administered)  LORazepam (ATIVAN) tablet 1 mg (1 mg Oral Given 11/24/15 2057)  methylPREDNISolone sodium succinate (SOLU-MEDROL) 125 mg/2 mL injection 125 mg (125 mg Intravenous Given 11/24/15 2144)  albuterol (PROVENTIL) (2.5 MG/3ML) 0.083% nebulizer solution 5 mg (5 mg Nebulization Given 11/24/15 2145)  ipratropium (ATROVENT) nebulizer solution 0.5 mg (0.5 mg Nebulization Given 11/24/15 2145)  azithromycin (ZITHROMAX) tablet 1,000 mg (1,000 mg Oral Given 11/24/15 2145)  cefTRIAXone (ROCEPHIN) injection 250 mg (250 mg Intramuscular Given 11/24/15 2146)  sodium chloride 0.9 % bolus 1,000 mL (1,000 mLs Intravenous New Bag/Given 11/24/15 2257)  potassium chloride SA (K-DUR,KLOR-CON) CR tablet 20 mEq (20 mEq Oral Given 11/24/15 2257)  albuterol (PROVENTIL)  (2.5 MG/3ML) 0.083% nebulizer solution 5 mg (5 mg Nebulization Given 11/24/15 2257)  ipratropium (ATROVENT) nebulizer solution 0.5 mg (0.5 mg Nebulization Given 11/24/15 2257)     Initial Impression / Assessment and Plan / ED Course  I have reviewed the triage vital signs and the nursing notes.  Pertinent labs & imaging results that were available during my care of the patient were reviewed by me and considered in my medical decision making (see chart for details).  Clinical Course as of Nov 24 4  Wed Nov 24, 2015  2334 Hyponatremia treated with normal saline, unclear etiology but patient does appear hypovolemic, with AKI, secondary to cocaine?  Sodium: (!) 126 [LT]  2336 Hypokalemia, ordered 30 mEq of potassium administered over 3 hours, PO potassium - Per Dr. Regenia Skeeter Potassium: (!) 2.9 [LT]  2337 AKI relative to baseline sCr (0.90-1.06) Creatinine: (!) 1.65 [LT]  2338 CXR improved from past admission, no evident of infiltrate or pulmonary edema DG Chest 2 View [LT]  2339 BNP negative, does not appear to have CHF component of SOB B Natriuretic Peptide: 68.9 [LT]    Clinical Course User Index [LT] Delsa Grana, PA-C   Pt presents with agitation, paranoia, requesting help with cocaine use  and psychiatric illness which has worsened, pt and pt's son feel she is currently a danger to herself and others.  The patient denies homicidal ideations and suicidal ideations however she states she is extremely paranoid and scared and feels she may harm someone because of these feelings. She states that her mind is "not right" She also complains of 2 days of worsening shortness of breath with increased sputum production and wheeze.  She does have multiple chronic medical conditions including COPD, sleep apnea, and CHF, suspect she may also have obesity hypoventilation syndrome. She uses CPAP and home oxygen usually at night.  She denies worsening orthopnea, lower extremity edema.  She appears hypovolemic.   On initial exam she had frequent cough with scattered rhonchi and diffuse wheeze.  This cleared with 2 breathing treatments, and she was given Solu-Medrol.  Chest x-ray negative for pneumonia, pulmonary edema and pleural effusion.    Patient's workup was significant for hyponatremia, hypokalemia, hypochloremia, and AKI.  BNP was negative.  EKG was normal sinus with prolonged QTc (514).  She does have a leukocytosis of 16.2, improved anemia, which may be secondary to hemoconcentration from dehydration.  Clinically she appears to have a mild COPD exacerbation, she does not appear septic, there is no other focal pain complaints, no other history suggestive of other etiologies of infection. Leukocyte esterase is may be secondary to cocaine use and stress reaction.  She was initially very agitated and hypertensive.  She was given 1 mg of by mouth Ativan and while in the ER her blood pressure has improved to systolic 150s.  She does continue to appear agitated but now has some intermittent somnolence when speaking to her she's dozed off multiple times with my recent reassessment (12:06 AM).   [name] breathing rapidly but does not appear to have any respiratory distress, increased work of breathing or retractions, she may be slightly hyperventilating secondary to anxiety and agitation.  When she dozes off she will drop  oxygen saturations to 90-91%, [place] O2 ordered as needed to maintain >92%.    Cases been reviewed with attending physician, Dr. Goldston, who has advised with IVF and K+ replacement, and agrees pt needs medical admission.  TTS consulted.  Pt is voluntary at this time, requesting help and treatment, she denies SI & HI, but will not specifically answer if she has AVH.  She does not appear to be actively hallucinating but does have paranoid delusions.    Family practice was consulted for admission, will be admitted to tele obs under Dr. Neal.  Patient did receive 2 breathing treatment after her labs  were obtained, potassium the lower. She requested testing and treatment for STDs because of unprotected sex however she does not have any genitourinary symptoms.  She was also initially alert and oriented and now has intermittent somnolence that is still easily aroused.  Final Clinical Impressions(s) / ED Diagnoses   Final diagnoses:  Hyponatremia  Hypokalemia  Hypochloremia  AKI (acute kidney injury) (HCC)  Cocaine abuse  Psychosis, unspecified psychosis type    New Prescriptions New Prescriptions   No medications on file      , PA-C 11/25/15 0007    Scott Goldston, MD 11/26/15 1507  

## 2015-11-24 NOTE — Telephone Encounter (Signed)
Pt is calling and would like the doctor to call her ASAP. She haas a drug problem and would like to seek help in getting into a drug program please call ASAP. jw

## 2015-11-24 NOTE — Telephone Encounter (Signed)
Spoke with patient, she is crying on the phone for help. States she has been doing cocaine, last use was a couple hours ago but her use has increased and she is afraid and really wants help but needs to find a place that will accept her insurance. Informed patient I would send a message to PCP and Education officer, museum.

## 2015-11-24 NOTE — Telephone Encounter (Signed)
I attempted to return the call, it went straight to voicemail. Will forward to [place] as I will not be in clinic tomorrow.  Archie Patten, MD [facility] Resident  11/24/2015, 5:03 PM

## 2015-11-24 NOTE — ED Triage Notes (Signed)
Pt here admits to increased crack cocaine use; pt sts AH and VH x 1 month; pt sts taking meds and denies Si/HI

## 2015-11-24 NOTE — ED Notes (Signed)
Pt returned from xray

## 2015-11-25 ENCOUNTER — Encounter: Payer: Self-pay | Admitting: Licensed Clinical Social Worker

## 2015-11-25 ENCOUNTER — Encounter (HOSPITAL_COMMUNITY): Payer: Self-pay | Admitting: Mental Health

## 2015-11-25 DIAGNOSIS — J441 Chronic obstructive pulmonary disease with (acute) exacerbation: Secondary | ICD-10-CM | POA: Diagnosis present

## 2015-11-25 DIAGNOSIS — F1410 Cocaine abuse, uncomplicated: Secondary | ICD-10-CM | POA: Diagnosis not present

## 2015-11-25 DIAGNOSIS — R45850 Homicidal ideations: Secondary | ICD-10-CM | POA: Diagnosis present

## 2015-11-25 DIAGNOSIS — D72829 Elevated white blood cell count, unspecified: Secondary | ICD-10-CM | POA: Diagnosis present

## 2015-11-25 DIAGNOSIS — F3160 Bipolar disorder, current episode mixed, unspecified: Secondary | ICD-10-CM | POA: Diagnosis not present

## 2015-11-25 DIAGNOSIS — I252 Old myocardial infarction: Secondary | ICD-10-CM | POA: Diagnosis not present

## 2015-11-25 DIAGNOSIS — Z818 Family history of other mental and behavioral disorders: Secondary | ICD-10-CM | POA: Diagnosis not present

## 2015-11-25 DIAGNOSIS — Z7984 Long term (current) use of oral hypoglycemic drugs: Secondary | ICD-10-CM | POA: Diagnosis not present

## 2015-11-25 DIAGNOSIS — D649 Anemia, unspecified: Secondary | ICD-10-CM | POA: Diagnosis present

## 2015-11-25 DIAGNOSIS — Z882 Allergy status to sulfonamides status: Secondary | ICD-10-CM | POA: Diagnosis not present

## 2015-11-25 DIAGNOSIS — F17210 Nicotine dependence, cigarettes, uncomplicated: Secondary | ICD-10-CM | POA: Diagnosis present

## 2015-11-25 DIAGNOSIS — R440 Auditory hallucinations: Secondary | ICD-10-CM | POA: Diagnosis not present

## 2015-11-25 DIAGNOSIS — Z6841 Body Mass Index (BMI) 40.0 and over, adult: Secondary | ICD-10-CM | POA: Diagnosis not present

## 2015-11-25 DIAGNOSIS — Z8042 Family history of malignant neoplasm of prostate: Secondary | ICD-10-CM | POA: Diagnosis not present

## 2015-11-25 DIAGNOSIS — F1423 Cocaine dependence with withdrawal: Secondary | ICD-10-CM | POA: Diagnosis present

## 2015-11-25 DIAGNOSIS — E871 Hypo-osmolality and hyponatremia: Secondary | ICD-10-CM | POA: Diagnosis present

## 2015-11-25 DIAGNOSIS — E876 Hypokalemia: Secondary | ICD-10-CM | POA: Diagnosis present

## 2015-11-25 DIAGNOSIS — E860 Dehydration: Secondary | ICD-10-CM | POA: Diagnosis present

## 2015-11-25 DIAGNOSIS — Z888 Allergy status to other drugs, medicaments and biological substances status: Secondary | ICD-10-CM | POA: Diagnosis not present

## 2015-11-25 DIAGNOSIS — F29 Unspecified psychosis not due to a substance or known physiological condition: Secondary | ICD-10-CM | POA: Diagnosis not present

## 2015-11-25 DIAGNOSIS — Z79899 Other long term (current) drug therapy: Secondary | ICD-10-CM | POA: Diagnosis not present

## 2015-11-25 DIAGNOSIS — Z8673 Personal history of transient ischemic attack (TIA), and cerebral infarction without residual deficits: Secondary | ICD-10-CM | POA: Diagnosis not present

## 2015-11-25 DIAGNOSIS — F3113 Bipolar disorder, current episode manic without psychotic features, severe: Secondary | ICD-10-CM | POA: Diagnosis not present

## 2015-11-25 DIAGNOSIS — R45851 Suicidal ideations: Secondary | ICD-10-CM | POA: Diagnosis present

## 2015-11-25 DIAGNOSIS — N179 Acute kidney failure, unspecified: Secondary | ICD-10-CM | POA: Diagnosis present

## 2015-11-25 DIAGNOSIS — E1140 Type 2 diabetes mellitus with diabetic neuropathy, unspecified: Secondary | ICD-10-CM | POA: Diagnosis present

## 2015-11-25 DIAGNOSIS — I25119 Atherosclerotic heart disease of native coronary artery with unspecified angina pectoris: Secondary | ICD-10-CM | POA: Diagnosis present

## 2015-11-25 DIAGNOSIS — F3164 Bipolar disorder, current episode mixed, severe, with psychotic features: Secondary | ICD-10-CM | POA: Diagnosis present

## 2015-11-25 DIAGNOSIS — F1420 Cocaine dependence, uncomplicated: Secondary | ICD-10-CM | POA: Diagnosis not present

## 2015-11-25 DIAGNOSIS — E785 Hyperlipidemia, unspecified: Secondary | ICD-10-CM | POA: Diagnosis present

## 2015-11-25 DIAGNOSIS — F28 Other psychotic disorder not due to a substance or known physiological condition: Secondary | ICD-10-CM | POA: Diagnosis not present

## 2015-11-25 DIAGNOSIS — I4581 Long QT syndrome: Secondary | ICD-10-CM | POA: Diagnosis present

## 2015-11-25 LAB — CBC
HCT: 34.1 % — ABNORMAL LOW (ref 36.0–46.0)
HCT: 34 % — ABNORMAL LOW (ref 36.0–46.0)
Hemoglobin: 11.1 g/dL — ABNORMAL LOW (ref 12.0–15.0)
Hemoglobin: 11 g/dL — ABNORMAL LOW (ref 12.0–15.0)
MCH: 26.9 pg (ref 26.0–34.0)
MCH: 27 pg (ref 26.0–34.0)
MCHC: 32.6 g/dL (ref 30.0–36.0)
MCHC: 32.4 g/dL (ref 30.0–36.0)
MCV: 82.8 fL (ref 78.0–100.0)
MCV: 83.3 fL (ref 78.0–100.0)
Platelets: 425 10*3/uL — ABNORMAL HIGH (ref 150–400)
Platelets: 420 10*3/uL — ABNORMAL HIGH (ref 150–400)
RBC: 4.12 MIL/uL (ref 3.87–5.11)
RBC: 4.08 MIL/uL (ref 3.87–5.11)
RDW: 15.5 % (ref 11.5–15.5)
RDW: 15.7 % — ABNORMAL HIGH (ref 11.5–15.5)
WBC: 15.5 10*3/uL — ABNORMAL HIGH (ref 4.0–10.5)
WBC: 12.2 10*3/uL — ABNORMAL HIGH (ref 4.0–10.5)

## 2015-11-25 LAB — CREATININE, SERUM
Creatinine, Ser: 1.44 mg/dL — ABNORMAL HIGH (ref 0.44–1.00)
GFR calc Af Amer: 46 mL/min — ABNORMAL LOW (ref >60)
GFR calc non Af Amer: 40 mL/min — ABNORMAL LOW (ref >60)

## 2015-11-25 LAB — BASIC METABOLIC PANEL
Anion gap: 8 (ref 5–15)
Anion gap: 10 (ref 5–15)
BUN: 8 mg/dL (ref 6–20)
BUN: 8 mg/dL (ref 6–20)
CO2: 24 mmol/L (ref 22–32)
CO2: 22 mmol/L (ref 22–32)
Calcium: 8.7 mg/dL — ABNORMAL LOW (ref 8.9–10.3)
Calcium: 9.1 mg/dL (ref 8.9–10.3)
Chloride: 100 mmol/L — ABNORMAL LOW (ref 101–111)
Chloride: 102 mmol/L (ref 101–111)
Creatinine, Ser: 1.15 mg/dL — ABNORMAL HIGH (ref 0.44–1.00)
Creatinine, Ser: 1.07 mg/dL — ABNORMAL HIGH (ref 0.44–1.00)
GFR calc Af Amer: >60 mL/min (ref >60)
GFR calc Af Amer: >60 mL/min (ref >60)
GFR calc non Af Amer: 52 mL/min — ABNORMAL LOW (ref >60)
GFR calc non Af Amer: 57 mL/min — ABNORMAL LOW (ref >60)
Glucose, Bld: 130 mg/dL — ABNORMAL HIGH (ref 65–99)
Glucose, Bld: 108 mg/dL — ABNORMAL HIGH (ref 65–99)
Potassium: 4.2 mmol/L (ref 3.5–5.1)
Potassium: 4.1 mmol/L (ref 3.5–5.1)
Sodium: 132 mmol/L — ABNORMAL LOW (ref 135–145)
Sodium: 134 mmol/L — ABNORMAL LOW (ref 135–145)

## 2015-11-25 LAB — MAGNESIUM
Magnesium: 1.4 mg/dL — ABNORMAL LOW (ref 1.7–2.4)
Magnesium: 2.2 mg/dL (ref 1.7–2.4)

## 2015-11-25 LAB — TSH: TSH: 0.191 u[IU]/mL — ABNORMAL LOW (ref 0.350–4.500)

## 2015-11-25 LAB — TROPONIN I
Troponin I: 0.06 ng/mL (ref <0.03)
Troponin I: <0.03 ng/mL (ref <0.03)
Troponin I: <0.03 ng/mL (ref <0.03)

## 2015-11-25 LAB — GLUCOSE, CAPILLARY
Glucose-Capillary: 126 mg/dL — ABNORMAL HIGH (ref 65–99)
Glucose-Capillary: 115 mg/dL — ABNORMAL HIGH (ref 65–99)
Glucose-Capillary: 93 mg/dL (ref 65–99)
Glucose-Capillary: 98 mg/dL (ref 65–99)

## 2015-11-25 LAB — GC/CHLAMYDIA PROBE AMP (~~LOC~~) NOT AT ARMC
Chlamydia: NEGATIVE
Neisseria Gonorrhea: NEGATIVE

## 2015-11-25 LAB — OSMOLALITY, URINE: Osmolality, Ur: 208 mOsm/kg — ABNORMAL LOW (ref 300–900)

## 2015-11-25 LAB — OSMOLALITY: Osmolality: 284 mOsm/kg (ref 275–295)

## 2015-11-25 LAB — SODIUM, URINE, RANDOM: Sodium, Ur: 41 mmol/L

## 2015-11-25 MED ORDER — INSULIN ASPART 100 UNIT/ML ~~LOC~~ SOLN
0.0000 [IU] | Freq: Three times a day (TID) | SUBCUTANEOUS | Status: DC
  Administered 2015-11-27: 3 [IU] via SUBCUTANEOUS
  Administered 2015-11-27 – 2015-11-29 (×2): 2 [IU] via SUBCUTANEOUS

## 2015-11-25 MED ORDER — ESCITALOPRAM OXALATE 10 MG PO TABS
20.0000 mg | ORAL_TABLET | Freq: Every day | ORAL | Status: DC
  Administered 2015-11-25: 20 mg via ORAL
  Filled 2015-11-25: qty 2

## 2015-11-25 MED ORDER — MOMETASONE FURO-FORMOTEROL FUM 200-5 MCG/ACT IN AERO: 2.0000 | INHALATION_SPRAY | Freq: Two times a day (BID) | RESPIRATORY_TRACT | Status: DC

## 2015-11-25 MED ORDER — FLUTICASONE FUROATE-VILANTEROL 100-25 MCG/INH IN AEPB
1.0000 | INHALATION_SPRAY | Freq: Every day | RESPIRATORY_TRACT | Status: DC
  Administered 2015-11-26 – 2015-12-01 (×6): 1 via RESPIRATORY_TRACT
  Filled 2015-11-25 (×2): qty 28

## 2015-11-25 MED ORDER — ATORVASTATIN CALCIUM 40 MG PO TABS
40.0000 mg | ORAL_TABLET | Freq: Every day | ORAL | Status: DC
  Administered 2015-11-25 – 2015-12-01 (×7): 40 mg via ORAL
  Filled 2015-11-25 (×7): qty 1

## 2015-11-25 MED ORDER — ACETAMINOPHEN 325 MG PO TABS: 650.0000 mg | ORAL_TABLET | Freq: Four times a day (QID) | ORAL | Status: DC | PRN

## 2015-11-25 MED ORDER — ZIPRASIDONE HCL 20 MG PO CAPS
80.0000 mg | ORAL_CAPSULE | Freq: Every day | ORAL | Status: DC
  Administered 2015-11-25 – 2015-11-30 (×6): 80 mg via ORAL
  Filled 2015-11-25 (×9): qty 4

## 2015-11-25 MED ORDER — ESCITALOPRAM OXALATE 10 MG PO TABS
10.0000 mg | ORAL_TABLET | Freq: Every day | ORAL | Status: DC
  Administered 2015-11-26 – 2015-11-28 (×3): 10 mg via ORAL
  Filled 2015-11-25 (×3): qty 1

## 2015-11-25 MED ORDER — MAGNESIUM SULFATE 2 GM/50ML IV SOLN
2.0000 g | Freq: Once | INTRAVENOUS | Status: AC
  Administered 2015-11-25: 2 g via INTRAVENOUS
  Filled 2015-11-25: qty 50

## 2015-11-25 MED ORDER — POLYETHYLENE GLYCOL 3350 17 G PO PACK: 17.0000 g | PACK | Freq: Every day | ORAL | Status: DC | PRN

## 2015-11-25 MED ORDER — ASPIRIN EC 81 MG PO TBEC
81.0000 mg | DELAYED_RELEASE_TABLET | Freq: Every day | ORAL | Status: DC
  Administered 2015-11-25 – 2015-12-01 (×7): 81 mg via ORAL
  Filled 2015-11-25 (×8): qty 1

## 2015-11-25 MED ORDER — MONTELUKAST SODIUM 10 MG PO TABS
10.0000 mg | ORAL_TABLET | Freq: Every day | ORAL | Status: DC
  Administered 2015-11-25 – 2015-11-30 (×6): 10 mg via ORAL
  Filled 2015-11-25 (×8): qty 1

## 2015-11-25 MED ORDER — GABAPENTIN 600 MG PO TABS
600.0000 mg | ORAL_TABLET | Freq: Three times a day (TID) | ORAL | Status: DC
  Administered 2015-11-25 – 2015-12-01 (×20): 600 mg via ORAL
  Filled 2015-11-25 (×20): qty 1

## 2015-11-25 MED ORDER — SODIUM CHLORIDE 0.9 % IV SOLN
INTRAVENOUS | Status: DC
  Administered 2015-11-25 – 2015-11-26 (×2): via INTRAVENOUS

## 2015-11-25 MED ORDER — ENOXAPARIN SODIUM 40 MG/0.4ML ~~LOC~~ SOLN
40.0000 mg | SUBCUTANEOUS | Status: DC
  Administered 2015-11-25 – 2015-12-01 (×7): 40 mg via SUBCUTANEOUS
  Filled 2015-11-25 (×7): qty 0.4

## 2015-11-25 MED ORDER — AMLODIPINE BESYLATE 10 MG PO TABS
10.0000 mg | ORAL_TABLET | Freq: Every day | ORAL | Status: DC
Start: 2015-11-25 — End: 2015-12-01
  Administered 2015-11-25 – 2015-12-01 (×7): 10 mg via ORAL
  Filled 2015-11-25 (×7): qty 1

## 2015-11-25 MED ORDER — ALBUTEROL SULFATE (2.5 MG/3ML) 0.083% IN NEBU: 2.5000 mg | INHALATION_SOLUTION | RESPIRATORY_TRACT | Status: DC | PRN

## 2015-11-25 MED ORDER — ALBUTEROL SULFATE (2.5 MG/3ML) 0.083% IN NEBU
3.0000 mL | INHALATION_SOLUTION | Freq: Four times a day (QID) | RESPIRATORY_TRACT | Status: DC | PRN
  Administered 2015-11-26 – 2015-11-28 (×2): 3 mL via RESPIRATORY_TRACT
  Filled 2015-11-25 (×3): qty 3

## 2015-11-25 MED ORDER — TIOTROPIUM BROMIDE MONOHYDRATE 18 MCG IN CAPS
18.0000 ug | ORAL_CAPSULE | Freq: Every day | RESPIRATORY_TRACT | Status: DC
  Administered 2015-11-26 – 2015-12-01 (×6): 18 ug via RESPIRATORY_TRACT
  Filled 2015-11-25 (×3): qty 5

## 2015-11-25 MED ORDER — ACETAMINOPHEN 650 MG RE SUPP: 650.0000 mg | Freq: Four times a day (QID) | RECTAL | Status: DC | PRN

## 2015-11-25 MED ORDER — MIRTAZAPINE 15 MG PO TABS
15.0000 mg | ORAL_TABLET | Freq: Every day | ORAL | Status: DC
  Administered 2015-11-25 – 2015-11-27 (×3): 15 mg via ORAL
  Filled 2015-11-25 (×4): qty 1

## 2015-11-25 MED ORDER — SODIUM CHLORIDE 0.9% FLUSH
3.0000 mL | Freq: Two times a day (BID) | INTRAVENOUS | Status: DC
  Administered 2015-11-25 – 2015-12-01 (×13): 3 mL via INTRAVENOUS

## 2015-11-25 NOTE — ED Notes (Signed)
Admitting at bedside 

## 2015-11-25 NOTE — Progress Notes (Signed)
CRITICAL VALUE ALERT  Critical value received:  Troponin 0.06 Date of notification:  11/25/15 Time of notification:  .0310am  Critical value read back: yes  Nurse who received alert:  Hermina Barters  MD notified (1st page):  Family Medicine Resident  Time of first page:  [id number]  MD notified (2nd page): na  Time of second page:na  Responding MD:  Paschal Dopp  Time MD responded:  [phone]

## 2015-11-25 NOTE — Progress Notes (Signed)
FPTS Interim Progress Note  S: Patient continues to endorse SI/HI.  Sitter at bedside.  New tardive dyskinesia, tongue/lip smacking behavior that was not previously noted in the ED last night.    O: BP (!) 156/81 (BP Location: Left Arm)   Pulse 81   Temp 98.7 F (37.1 C) (Oral)   Resp 20   Ht 5\' 4"  (1.626 m)   Wt 230 lb 6.4 oz (104.5 kg) Comment: scale a  SpO2 98%   BMI 39.55 kg/m    A/P: #Chest pain.  Stable.  Patient currently denies chest pain.  -Troponins 0.06>0.03 -Continue to monitor  #Crack cocaine use -consult SW -will contact her psychiatrist to look into outpatient substance abuse programs  #Electrolyte abnormalities.  Lexapro may be contributing to hyponatremia.  -Hyponatremia to 126 and Hypokalemia to 2.9. Magnesium low at 1.4.  -Gave 2 g Mag, IV 30 mEq K+, Kdur 20 mEq  -Check BMET and Mag at 3 pm.  -Replete electrolytes as needed -Obtain random urine Na+ -Obtain urine osmolality  #Homocidal ideation/Suicidal ideation.  Continues to endorse both.  Has a sitter bedside.  Does not have a specific plan. -Psychiatric c/s placed -Follows outpatient with psych.  Consider options for outpatient substance abuse program.   #AKI.  Creatinine this AM 1.65, from baseline 0.90. Appears euvolemic on exam this AM.  -Decrease fluids to 125 cc/hr from 175 cc/hr so as to not volume overload  #Bipolar 1 disorder.  On half dose of home medications.  With new symptom this AM of tongue movements/lip smacking.  Will discuss with psych, appreciate recommendations.  -Continue Lexapro 10 mg po daily -Continue Remeron 15 mg po daily -Continue Geodon 80 mg daily  #Prolonged QTc.  This AM 499.  Continue to avoid medications that prolong QTc. - Bipolar medication dosages have been decreased to half home dose.  -Continue to monitor  #STD testing .  Patient requested in ED as she has recent history of unprotected sex about 3 months ago.  Was given one dose of AZT and CTX in ED.  -Obtain  GC/Chlamydia, HIV, RPR for workup   Lovenia Kim, MD 11/25/2015, 11:48 AM PGY-1, [facility] pager [phone]

## 2015-11-25 NOTE — Progress Notes (Signed)
Orders received for suicide sitter. Suicide precations initiated. Hermina Barters RN 11/25/2015 2:27 AM

## 2015-11-25 NOTE — Consult Note (Signed)
   [facility] CM Inpatient Consult   11/25/2015  LULANI BOUR 1960-10-26 [id number]   Patient screened for potential [place] Management services for possible restart of services.  Community Care Manager states difficulty patient maintain goals in the program for HF.  Patient is currently being seen by medical staff for potential issues involving psychiatric follow up.  Patient is eligible for [facility] Care Management services under patient's [facility]  plan.  Will follow progress with inpatient care management team as appropriate.     Natividad Brood, RN BSN [facility] Liaison  [phone] business mobile phone Toll free office [phone]

## 2015-11-25 NOTE — ED Notes (Signed)
With TTS

## 2015-11-25 NOTE — Progress Notes (Signed)
Patient verbalized suicidal and homicidal ideation upon initial assessment to unit. AC notified. Family medicine medicine provider paged. Waiting for a call from physician. NT currently in room with patient. Will continue to monitor. Hermina Barters RN 11/25/2015 1:38 AM

## 2015-11-25 NOTE — Care Management Note (Signed)
Case Management Note  Patient Details  Name: Tiffany [name] MRN: [id number] Date of Birth: 09/27/1960  Subjective/Objective:   55 y.o. F admitted with CP. Pt reports she lives in [place] in private residence with Son, Sister and sisters girlfriend. Pt very sleepy while I asked her these questions. Will defer assessment to later date.                  Action/Plan: Discharge when medically stable   Expected Discharge Date:  11/27/15               Expected Discharge Plan:     In-House Referral:     Discharge planning Services  CM Consult  Post Acute Care Choice:  NA Choice offered to:  Patient  DME Arranged:    DME Agency:     HH Arranged:    [place] Agency:     Status of Service:  In process, will continue to follow  If discussed at Long Length of Stay Meetings, dates discussed:    Additional Comments:  Delrae Sawyers, RN 11/25/2015, 9:13 AM

## 2015-11-25 NOTE — Care Management Obs Status (Signed)
MEDICARE OBSERVATION STATUS NOTIFICATION   Patient Details  Name: Tiffany [name] MRN: [id number] Date of Birth: Jul 16, 1960   Medicare Observation Status Notification Given:  Yes    CrutchfieldAntony Haste, RN 11/25/2015, 9:13 AM

## 2015-11-25 NOTE — H&P (Signed)
[facility] Admission History and Physical Service Pager: [phone]  Patient name: Tiffany [name] Medical record number: [id number] Date of birth: 1960-08-09 Age: 55 y.o. Gender: female  Primary Care Provider: Kathrine Cords, MD Consultants: None Code Status: FULL  Chief Complaint:  cocaine "withdrawal"   Assessment and Plan: Tiffany [name] is a 55 y.o. female presenting with electrolyte abnormalities and dehydration . PMH is significant for T2DM with diabetic neuropathy,  Dyslipidemia, OSA, COPD, asthma, HTN,  Schizophrenia, depression with anxiety, obesity, bipolar 1 disorder, chronic pain syndrome, tobacco use, cocaine use.   #Chest pain with hx of CAD. Stable.  Resolved by the time she was seen in the ED.  Likely induced by cocaine use.  Patient on 2 CCB medications (Amlodipine and Verapamil).  CXR negative for cardiopulmonary disease.  EKG NSR with prolonged QTc. No ST segment elevations or T wave abnormalities.  Initial troponin 0.06.  BNP 68. CBC notable for leukocytosis to 16.2.  UDS + for cocaine and THC. Blood alcohol level negative.  Salicylate and acetaminophen levels negative.  UA with low spec grav, trace hgb, negative for LE and nitrites.  Urine microscopy with rare bacteria.  Differentials include cocaine induced chest pain vs. Heart arhythmia vs. MSK vs. GERD vs. PE.  Most likely given history of recent use today (several hours prior to ED) and onset of chest pain while using the drug. Heart score of 4. PE less likely given patient not tachypneic or tachycardic, with no new O2 requirement, and Wells' score 0.    MSK also less likely as no reproducible tenderness upon palpation of chest wall.  -Admit to inpatient, attending Dr. [name] Riis  -cardiac monitoring -Troponin I -Trend trops - AM EKG -c/s cardiology in AM -Consider Echo - Continue Aspirin 81 mg daily -Tylenol 650 mg Q6 prn - IVF - obtain TSH and lipid  #History of crack cocaine use.   Patient acknowledges she has a drug problem and would like to seek help to get into a drug program.  Her use has also recently increased.  In ED reporting auditory and visual hallucinations x past 1 month and has been feeling scared and agitated.  UDS + for cocaine and THC.   -consult SW for substance abuse -monitor for signs of withdrawal - Likely cause of CP  #Electrolyte abnormalities.  Labs notable for hypokalemia to 2.9,  and moderate hyponatremia to 126.  Baseline Na+ ~142 (3 months ago). Hyponatremia at this time suspected to be acute/subacute with little to no involvement in patient's symptoms >> placing it in the "moderate" category. Etiology unknown but most likely 2/2 medications, poor diet, or both. Cannot r/o SIADH at this time (as urine appeared dilute).  S/p 1 L NS.  -s/p IV 30 mEq K+ and K-dur 20 mEq - relatively conservative fluid replacement at this time as we cannot r/o SIADH >> will promptly DC IVF replacement if Na drops w/ IVF. - Obtain Mag -Recheck and replete electrolytes as needed -AM BMET  #Homocidal ideation + suicidal ideation.  Denied SI/HI in ED.  However upon transfer to unit, patient reporting current symptoms of both.  Patient admits to having auditory and visual hallucinations for the past month.    -Sitter at bedside -Continue to monitor closely  #AKI.  Creatinine 1.65.  Baseline appears to be 0.90.  Likely secondary to dehydration, as patient states she has not eaten anything today.  -IV NS @ 125 cc/hour -Hold Lasix for now in setting of her  dehydration and hypo-Na/K -Monitor BMET  #Leukocytosis.  White count elevated in ED to 16.2 with no obvious source of infection.  UA and CXR negative.  Patient afebrile with no systemic signs of infection.  -AM CBC  #Prolonged QTc.  QTc of 514.  - Avoid medications that can prolong QTc to avoid torsades - Per pharmacy, can half dose of Remeron and Geodon and Lexapro  - Consider titration off of these QT prolonging  meds - Patient provided Azithro in ED for "COPD exacerbation" - Obtain Mag - AM EKG as above  #Bipolar 1 disorder.  At home on Remeron 30 mg daily, Geodon 160 mg and Lexapro.  However also in setting of prolonged QTc would decrease dosages.  Per pharmacy, may half the doses of home Remeron and Geodon .  Also, given history of bipolar disorder, consider manic episode may have precipitated her to start smoking crack again and engaging in risky sexual activity.   - Continue Lexapro 20 mg po daily at half dose - Continue Remeron 15 mg po daily at bedtime at half dose - Continue Geodon 80 mg daily at bedtime at half dose  #COPD.  Stable on RA.  Patient reports compliance with home medications.  S/p Solu-Medrol x 1, Atrovent x 1 and albuterol neb x 1 in ED.   - Patient provided Azithro and CTX in ED for "exacerbation" -Albuterol nebs Q6 prn -Continue Fluticasone 1 puff daily -Continue Singulair 10 mg po daily -Continue Spiriva 19 mcg daily -CPAP at bedtime -O2 per [place] if needed -pulse ox -Monitor  #T2DM.  Hgb A1c on 06/01/15 of 5.6.  At home on Metformin 500 mg BID.   -check Hgb A1c  -Moderate SSI -Monitor CBGs -Neurontin 600 mg TID for DM neuropathy  #Anemia, normocytic. Hgb 11.7, stable from prior admission.  Appears to have a baseline of ~10.  -AM CBC -Transfusion threshold 8.0 -Monitor  #STD testing.  Requested by patient as she had unprotected sex about 3 months ago.  Hypersexuality may be secondary to a manic episode given her history of bipolar disorder.  Given one dose of Azithromycin and CTX in ED.   -GC/Chlamydia  #HTN.  BP on admission 202/118.  At home on Norvasc 5 mg bid, Verapamil 240 mg BID and Lisinopril 40 mg daily.   Is on 2 CCBs, will hold Verapamil for now.   -Continue home Norvasc 10 mg daily -monitor blood pressures  #HLD.  Last lipid panel June 2015 with LDL 54, HDL 35, TG 115 .  At home on Lipitor.  -Continue Lipitor 40 mg daily  #CHF.  Stable.  BNP 68.  With  no significant LE edema on exam. Recent admission in August 2017 with Echo showing EF 55-60%, G1DD.  At home on Lasix 40 mg daily.  -Hold home Lasix in setting of her dehydration -Re-assess fluid status in AM   FEN/GI: Heart healthy/carb modified, IV NS @ 125 cc.hr Prophylaxis: Lovenox  Disposition: Admit to inpatient, attending Dr. [name] Riis  History of Present Illness:  Tiffany [name] is a 55 y.o. female presenting with cocaine "withdrawal"  Patient brought in by her son because he believed her to be a danger to herself and others.  Patient reports using crack a long time ago after which time she had stopped.  Now has been using again x the past 1 year.  This evening felt like she was withdrawing.  States that the withdrawal this time is different from the other times and is much  worse.  She has felt agitated and as if everything is very intense. The sounds people make feel louder.  Feels like she wanted to run but "could not go anywhere."  Last time she used was 3-4 hours ago.  Uses frequently, goes on a binge for 2-3 days and then takes a break.  Reports that she smokes the cocaine, does not inject or snort it.  No history of alcohol or IV drug use.    She reports a sharp, new, upper midline chest pain which started while getting high. The pain did not radiate.  Is not having the chest pain now and denies recurrence while she has been in the ED.  Has a hx of COPD.  Cough has been present for years but has recently worsened over past 1 month.  With clear sputum production, no blood.  Denies recent colds/congestion, fever.  Does have some rhinorrhea.  Has been smoking cigarettes since she was 55 years old, 1 PPD and is a current smoker (40 packyear hx).    Lives at home with her son, cousin and his girlfriend.  Has not been eating today.  Says she "always stays dehydrated no matter what I eat or drink."   Review Of Systems: Per HPI with the following additions:  Review of Systems  Constitutional:  Negative for chills, fever and malaise/fatigue.  HENT: Negative for congestion.   Respiratory: Positive for cough and sputum production. Negative for hemoptysis, shortness of breath and wheezing.   Cardiovascular: Negative for chest pain and palpitations.  Gastrointestinal: Negative for abdominal pain.  Genitourinary: Negative.   Neurological: Negative for weakness.  Psychiatric/Behavioral: Positive for depression, hallucinations, substance abuse and suicidal ideas.   Patient Active Problem List   Diagnosis Date Noted  . Hyponatremia 11/25/2015  . AKI (acute kidney injury) ([place]) 11/24/2015  . Blood in sputum   . Acute on chronic congestive heart failure ([place])   . SOB (shortness of breath)   . Hyperlipidemia   . Hypoxia 08/19/2015  . COPD with acute exacerbation ([place]) 08/19/2015  . Acute on chronic respiratory failure with hypoxia and hypercapnia ([place]) 06/22/2015  . Acute respiratory failure with hypoxemia ([place]) 06/22/2015  . Scabies 06/01/2015  . Onychomycosis 01/21/2015  . Nasal congestion 12/17/2014  . Type 2 diabetes mellitus with complication ([place])   . Tobacco abuse 07/22/2014  . COPD (chronic obstructive pulmonary disease) ([place]) 07/08/2014  . COPD exacerbation ([place]) 06/26/2014  . Seborrheic keratoses 12/31/2013  . Thrush 09/19/2013  . Knee pain, bilateral 01/22/2013  . Seizure ([place]) 01/04/2013  . Health care maintenance 11/25/2012  . Folliculitis 92/33/0076  . History of kidney stones 06/18/2012  . Chronic pain syndrome 06/18/2012  . Muscle spasm 06/18/2012  . Elevated troponin 04/28/2012  . Asthma with acute exacerbation 04/28/2012  . Allergic reaction 04/07/2012  . Bipolar 1 disorder ([employer]) 12/08/2011  . Itching 09/06/2011  . HSV infection 08/30/2011  . Dyslipidemia 04/24/2011  . Anemia 04/24/2011  . Depression with anxiety 04/24/2011  . Diabetic neuropathy ([place]) 04/24/2011  . OSA and COPD overlap syndrome ([place]) 10/18/2010  . Asthma 10/18/2010  . Obesity  10/18/2010  . HTN (hypertension) 10/18/2010  . Type 2 diabetes mellitus ([place]) 10/18/2010   Past Medical History: Past Medical History:  Diagnosis Date  . Anxiety   . Asthma   . Cocaine abuse   . Complication of anesthesia    decreased bp, decreased heart rate  . Diabetes mellitus    class 2  . Disorder of nervous system   .  Emphysema   . Heart attack   . High blood pressure   . High cholesterol   . Hypertension   . Incontinence   . Manic depression ([place])   . Myocardial infarct   . Pneumonia 06/2015  . Seasonal allergies   . Seizures ([place])   . Sinus trouble   . Sleep apnea   . Stroke [facility])    Past Surgical History: Past Surgical History:  Procedure Laterality Date  . ABDOMINAL HYSTERECTOMY    . CESAREAN SECTION    . HIATAL HERNIA REPAIR     Social History: Social History  Substance Use Topics  . Smoking status: Current Every Day Smoker    Packs/day: 0.50    Years: 37.00    Types: Cigarettes    Start date: 01/16/1977  . Smokeless tobacco: Never Used     Comment: Previous 2 PPD menthol full flavor  . Alcohol use No   Family History: Family History  Problem Relation Age of Onset  . Cancer Father     prostate  . Cancer Mother     lung  . Depression Mother   . Depression Sister   . Anxiety disorder Sister   . Schizophrenia Sister   . Bipolar disorder Sister   . Depression Sister   . Depression Brother   . Heart failure      cousin   Allergies and Medications: Allergies  Allergen Reactions  . Hydroxyzine Other (See Comments)    unknown  . Codeine Nausea And Vomiting  . Sulfa Antibiotics Itching   No current facility-administered medications on file prior to encounter.    Current Outpatient Prescriptions on File Prior to Encounter  Medication Sig Dispense Refill  . ACCU-CHEK SOFTCLIX LANCETS lancets     . albuterol (PROVENTIL HFA;VENTOLIN HFA) 108 (90 Base) MCG/ACT inhaler Inhale 2 puffs into the lungs every 6 (six) hours as needed. 1 Inhaler 6  .  albuterol (PROVENTIL) (2.5 MG/3ML) 0.083% nebulizer solution Take 3 mLs (2.5 mg total) by nebulization every 6 (six) hours as needed for wheezing or shortness of breath. 150 mL 1  . amLODipine (NORVASC) 5 MG tablet Take 2 tablets (10 mg total) by mouth daily. 180 tablet 0  . aspirin 81 MG EC tablet Take 1 tablet (81 mg total) by mouth daily. Swallow whole. 90 tablet 0  . atorvastatin (LIPITOR) 40 MG tablet Take 1 tablet (40 mg total) by mouth daily. 90 tablet 4  . Blood Glucose Monitoring Suppl (ACCU-CHEK AVIVA PLUS) w/Device KIT To check CBGs three times daily 1 kit 0  . escitalopram (LEXAPRO) 20 MG tablet Take 1 tablet (20 mg total) by mouth daily. 90 tablet 0  . fluticasone furoate-vilanterol (BREO ELLIPTA) 100-25 MCG/INH AEPB INHALE 1 PUFF EVERY DAY 180 each 0  . furosemide (LASIX) 40 MG tablet Take 1 tablet (40 mg total) by mouth daily. Take Lasix twice daily from 7/14 until 7/18, then daily 30 tablet 0  . gabapentin (NEURONTIN) 600 MG tablet Take 1 tablet (600 mg total) by mouth 3 (three) times daily. 270 tablet 0  . glucose blood test strip Use to check blood sugar 3 times daily. 100 each 12  . lisinopril (PRINIVIL,ZESTRIL) 40 MG tablet Take 1 tablet (40 mg total) by mouth daily. 90 tablet 0  . loratadine (CLARITIN) 10 MG tablet Take 1 tablet (10 mg total) by mouth daily. 90 tablet 0  . metFORMIN (GLUCOPHAGE-XR) 500 MG 24 hr tablet Take 1 tablet (500 mg total) by mouth 2 (two)  times daily. 180 tablet 0  . mirtazapine (REMERON) 30 MG tablet Take 1 tablet (30 mg total) by mouth at bedtime. 90 tablet 0  . montelukast (SINGULAIR) 10 MG tablet Take 1 tablet (10 mg total) by mouth at bedtime. 90 tablet 3  . Multiple Vitamins-Minerals (MULTIVITAMIN WITH MINERALS) tablet Take 1 tablet by mouth daily. Reported on 07/28/2015    . naproxen (NAPROSYN) 500 MG tablet Take 1 tablet (500 mg total) by mouth 3 (three) times daily as needed. (Patient taking differently: Take 500 mg by mouth 3 (three) times daily  as needed for mild pain. ) 270 tablet 0  . nicotine polacrilex ([employer] NICOTINE POLACRILEX) 2 MG gum Take 1 each (2 mg total) by mouth as needed for smoking cessation. Do not exceed 24 pieces in a day. (Patient not taking: Reported on 09/22/2015) 100 tablet 0  . omeprazole (PRILOSEC) 40 MG capsule Take 1 capsule (40 mg total) by mouth daily. 90 capsule 1  . oxybutynin (DITROPAN) 5 MG tablet Take 1 tablet (5 mg total) by mouth 3 (three) times daily. 270 tablet 3  . potassium chloride (K-DUR,KLOR-CON) 10 MEQ tablet Take 2 tablets (20 mEq total) by mouth daily. (Patient taking differently: Take 10 mEq by mouth 2 (two) times daily. ) 180 tablet 2  . SYMBICORT 160-4.5 MCG/ACT inhaler INHALE 2 PUFFS INTO THE LUNGS DAILY. 1 Inhaler 4  . tiotropium (SPIRIVA HANDIHALER) 18 MCG inhalation capsule Place 1 capsule (18 mcg total) into inhaler and inhale daily. 90 capsule 4  . verapamil (VERELAN PM) 240 MG 24 hr capsule Take 2 capsules (480 mg total) by mouth at bedtime. 180 capsule 0  . ziprasidone (GEODON) 80 MG capsule Take 2 capsules (160 mg total) by mouth at bedtime. 180 capsule 0   Objective: BP (!) 146/89 (BP Location: Right Arm)   Pulse 81   Temp 98 F (36.7 C) (Oral)   Resp 18   Ht '5\' 4"'  (1.626 m)   Wt 230 lb 6.4 oz (104.5 kg) Comment: scale a  SpO2 98%   BMI 39.55 kg/m  Exam: General: obese AA female laying in hospital bed in NAD Eyes: PERRL, EOMI, no conjunctival injection ENTM: mucous membranes dry, oropharynx clear, tiny bit of dried blood under nose Neck: normal, supple Cardiovascular: RRR, no MRG, 2+ pedal pulses, no reproducible tenderness upon palpation of chest wall.  Respiratory: CTA B/L Gastrointestinal: soft, NT ND, +bs MSK: no significant edema or tenderness Derm: warm, dry Neuro: AAOx3, no focal deficits Psych: mood appropriate, appeared anxious  Labs and Imaging: CBC BMET   Recent Labs Lab 11/25/15 0159  WBC 15.5*  HGB 11.1*  HCT 34.1*  PLT 425*    Recent Labs Lab  11/24/15 2116 11/25/15 0159  NA 126*  --   K 2.9*  --   CL 92*  --   CO2 23  --   BUN 12  --   CREATININE 1.65* 1.44*  GLUCOSE 95  --   CALCIUM 8.8*  --      Dg Chest 2 View  Result Date: 11/24/2015 CLINICAL DATA:  Shortness of breath, wheezing, productive cough for 1 year. History of COPD, emphysema, congestive heart failure, hypertension. EXAM: CHEST  2 VIEW COMPARISON:  08/19/2015 FINDINGS: Mild cardiac enlargement without pulmonary vascular congestion or edema. Previous right lung infiltrates have resolved. Lungs appear clear and expanded today. No focal consolidation or airspace disease. No blunting of costophrenic angles. No pneumothorax. Tortuous aorta. Degenerative changes in the spine. IMPRESSION: No active  cardiopulmonary disease. Electronically Signed   By: Lucienne Capers M.D.   On: 11/24/2015 21:36   Lovenia Kim, MD 11/25/2015, 3:48 AM PGY-1, [facility] pager: [phone], text pages welcome   Upper Level Addendum:  I have seen and evaluated this patient along with Dr. Reesa Chew and reviewed the above note, making necessary revisions in red.   Elberta Leatherwood, MD,MS,  PGY3 11/25/2015 6:45 AM
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
## 2015-11-25 NOTE — Progress Notes (Signed)
Patient ID: Tiffany [name], female   DOB: 21-Apr-1960, 56 y.o.   MRN: [id number]   LCSW informed that patient is in the hospital in observation.  Patient has an appointment at 4:00 tomorrow with Pulmonology. Due to previous no shows patient will need to go to the appointment or appointment needs to be canceled.   Call to attending MD to verify that patient will not be discharged from the hospital today.  Consulted with Referral Coordinator, Tia.  Plan: Referral Coordinator will cancel patient's appointment and reschedule for a later date.  Casimer Lanius, LCSW Licensed Clinical Social Worker [place]   [phone] 12:03 PM

## 2015-11-26 ENCOUNTER — Institutional Professional Consult (permissible substitution): Payer: Self-pay | Admitting: Emergency Medicine

## 2015-11-26 DIAGNOSIS — R440 Auditory hallucinations: Secondary | ICD-10-CM

## 2015-11-26 DIAGNOSIS — Z888 Allergy status to other drugs, medicaments and biological substances status: Secondary | ICD-10-CM

## 2015-11-26 DIAGNOSIS — F17210 Nicotine dependence, cigarettes, uncomplicated: Secondary | ICD-10-CM

## 2015-11-26 DIAGNOSIS — Z801 Family history of malignant neoplasm of trachea, bronchus and lung: Secondary | ICD-10-CM

## 2015-11-26 DIAGNOSIS — F28 Other psychotic disorder not due to a substance or known physiological condition: Secondary | ICD-10-CM

## 2015-11-26 DIAGNOSIS — E871 Hypo-osmolality and hyponatremia: Principal | ICD-10-CM

## 2015-11-26 DIAGNOSIS — F3160 Bipolar disorder, current episode mixed, unspecified: Secondary | ICD-10-CM

## 2015-11-26 DIAGNOSIS — F29 Unspecified psychosis not due to a substance or known physiological condition: Secondary | ICD-10-CM

## 2015-11-26 DIAGNOSIS — Z79899 Other long term (current) drug therapy: Secondary | ICD-10-CM

## 2015-11-26 DIAGNOSIS — Z882 Allergy status to sulfonamides status: Secondary | ICD-10-CM

## 2015-11-26 DIAGNOSIS — Z8042 Family history of malignant neoplasm of prostate: Secondary | ICD-10-CM

## 2015-11-26 DIAGNOSIS — F1410 Cocaine abuse, uncomplicated: Secondary | ICD-10-CM

## 2015-11-26 DIAGNOSIS — Z818 Family history of other mental and behavioral disorders: Secondary | ICD-10-CM

## 2015-11-26 LAB — CBC
HCT: 31.3 % — ABNORMAL LOW (ref 36.0–46.0)
Hemoglobin: 9.9 g/dL — ABNORMAL LOW (ref 12.0–15.0)
MCH: 27.1 pg (ref 26.0–34.0)
MCHC: 31.6 g/dL (ref 30.0–36.0)
MCV: 85.8 fL (ref 78.0–100.0)
Platelets: 400 10*3/uL (ref 150–400)
RBC: 3.65 MIL/uL — ABNORMAL LOW (ref 3.87–5.11)
RDW: 17 % — ABNORMAL HIGH (ref 11.5–15.5)
WBC: 10.6 10*3/uL — ABNORMAL HIGH (ref 4.0–10.5)

## 2015-11-26 LAB — GLUCOSE, CAPILLARY
Glucose-Capillary: 87 mg/dL (ref 65–99)
Glucose-Capillary: 83 mg/dL (ref 65–99)
Glucose-Capillary: 105 mg/dL — ABNORMAL HIGH (ref 65–99)
Glucose-Capillary: 105 mg/dL — ABNORMAL HIGH (ref 65–99)

## 2015-11-26 LAB — BASIC METABOLIC PANEL
Anion gap: 6 (ref 5–15)
BUN: 8 mg/dL (ref 6–20)
CO2: 26 mmol/L (ref 22–32)
Calcium: 8.8 mg/dL — ABNORMAL LOW (ref 8.9–10.3)
Chloride: 108 mmol/L (ref 101–111)
Creatinine, Ser: 0.97 mg/dL (ref 0.44–1.00)
GFR calc Af Amer: >60 mL/min (ref >60)
GFR calc non Af Amer: >60 mL/min (ref >60)
Glucose, Bld: 76 mg/dL (ref 65–99)
Potassium: 4.1 mmol/L (ref 3.5–5.1)
Sodium: 140 mmol/L (ref 135–145)

## 2015-11-26 LAB — HEMOGLOBIN A1C
Hgb A1c MFr Bld: 5.6 % (ref 4.8–5.6)
Mean Plasma Glucose: 114 mg/dL

## 2015-11-26 LAB — HIV ANTIBODY (ROUTINE TESTING W REFLEX): HIV Screen 4th Generation wRfx: NONREACTIVE

## 2015-11-26 LAB — RPR: RPR Ser Ql: NONREACTIVE

## 2015-11-26 MED ORDER — PANTOPRAZOLE SODIUM 40 MG PO TBEC
80.0000 mg | DELAYED_RELEASE_TABLET | Freq: Every day | ORAL | Status: DC
  Administered 2015-11-26 – 2015-12-01 (×6): 80 mg via ORAL
  Filled 2015-11-26 (×6): qty 2

## 2015-11-26 MED ORDER — BLISTEX MEDICATED EX OINT
TOPICAL_OINTMENT | CUTANEOUS | Status: DC | PRN
  Filled 2015-11-26: qty 6.3

## 2015-11-26 MED ORDER — BACIT-POLY-NEO HC 1 % EX OINT
TOPICAL_OINTMENT | CUTANEOUS | Status: DC | PRN
  Administered 2015-11-28: 13:00:00 via TOPICAL
  Filled 2015-11-26: qty 15

## 2015-11-26 MED ORDER — PREDNISONE 50 MG PO TABS
50.0000 mg | ORAL_TABLET | Freq: Every day | ORAL | Status: DC
  Administered 2015-11-26 – 2015-11-27 (×2): 50 mg via ORAL
  Filled 2015-11-26 (×2): qty 1

## 2015-11-26 NOTE — Progress Notes (Signed)
Family Medicine Teaching Service Daily Progress Note Intern Pager: [phone]  Patient name: Tiffany [name] Medical record number: [id number] Date of birth: 1960-03-17 Age: 55 y.o. Gender: female  Primary Care Provider: Kathrine Cords, MD Consultants: Psychiatry Code Status: FULL  Pt Overview and Major Events to Date:    Assessment and Plan:  #Chest pain.  Stable.  Patient currently denies chest pain.  -Troponins 0.06>0.03>0.03  -Continue to monitor   #Crack cocaine use -consult SW -will contact her psychiatrist to look into inpatient vs outpatient substance abuse programs  #Electrolyte abnormalities.  Improving.  Lexapro may be contributing to hyponatremia.   Random urine Na+ 41.  Urine osmolality 208.   Likely renal losses (adrenal vs diuretic induced vs cerebral salt wasting).  Diuretic induced unlikely given she is on Lasix, not Thiazide.  Cerebral salt wasting also less likely as no known history of head trauma.   -Check AM cortisol -Na+ 126>140 this AM.  K+ 2.9>4.1 this AM. Magnesium low at 1.4.  -Check AM BMET and Magnesium -Replete electrolytes as needed  #Homocidal ideation/Suicidal ideation.  Continues to endorse both.  Has a sitter bedside.  Does not have a specific plan. -Psychiatric c/s placed - Psych to see today -Follows outpatient with psych.  Consider options for outpatient substance abuse program.   #AKI.  Resolved.  Creatinine this AM 165>0.97, from baseline 0.90. -Stop fluids today as patient euvolemic on exam this AM  #Bipolar 1 disorder.  On half dose of home medications.  With new symptom this AM of tongue movements/lip smacking.  Will discuss with psych, appreciate recommendations.  -Continue Lexapro 10 mg po daily -Continue Remeron 15 mg po daily -Continue Geodon 80 mg daily  #Prolonged QTc.  This AM 499.  Continue to avoid medications that prolong QTc. - Bipolar medication dosages have been decreased to half home dose.  -Continue to  monitor  #CHF.  Stable.  BNP 68 on admission.  With no significant LE edema on exam. Recent admission in August 2017 with Echo showing EF 55-60%, G1DD.  At home on Lasix 40 mg daily.  -Hold home Lasix in setting of her dehydration -patient euvolemic on exam -continue to monitor  #T2DM.  Hgb A1c on 06/01/15 of 5.6.  At home on Metformin 500 mg BID.   -Moderate SSI -Monitor CBGs -Neurontin 600 mg TID for DM neuropathy  #HTN.  BP on admission 202/118.  At home on Norvasc 5 mg bid, Verapamil 240 mg BID and Lisinopril 40 mg daily.   Is on 2 CCBs, will hold Verapamil for now.   -Continue home Norvasc 10 mg daily -monitor blood pressures  #STD testing .  Patient requested in ED as she has recent history of unprotected sex about 3 months ago.  Was given one dose of AZT and CTX in ED.  -GC/Chlamydia, HIV, RPR all negative.    FEN/GI: heart healthy/carb modified PPx: Lovenox  Disposition: Dispo pending clinical improvement   Subjective:  Patient states she is feeling homicidal and suicidal.  Denies shortness of breath, denies chest pain at this time.     Objective: Temp:  [97.4 F (36.3 C)-98.2 F (36.8 C)] 97.4 F (36.3 C) (11/10 1055) Pulse Rate:  [69-74] 69 (11/10 1055) Resp:  [17-20] 20 (11/10 1055) BP: (130-143)/(75-90) 143/85 (11/10 1055) SpO2:  [95 %-98 %] 96 % (11/10 1055) Weight:  [228 lb 9.6 oz (103.7 kg)] 228 lb 9.6 oz (103.7 kg) (11/10 0531) Physical Exam: General: obese, african-american female laying in bed in NAD,  repetitive tongue and lip smacking movements, on [place].  With sitter at bedside.  Cardiovascular: RRR, no MRG Respiratory: CTA B/L, comfortable work of breathing via [place] Abdomen: obese, soft, NT, ND +bs Extremities: no edema or tenderness Psych: still endorsing SI and HI  Laboratory:  Recent Labs Lab 11/25/15 0159 11/25/15 0756 11/26/15 0359  WBC 15.5* 12.2* 10.6*  HGB 11.1* 11.0* 9.9*  HCT 34.1* 34.0* 31.3*  PLT 425* 420* 400    Recent Labs Lab  11/24/15 2116  11/25/15 0756 11/25/15 1405 11/26/15 0359  NA 126*  --  132* 134* 140  K 2.9*  --  4.2 4.1 4.1  CL 92*  --  100* 102 108  CO2 23  --  24 22 26   BUN 12  --  8 8 8   CREATININE 1.65*  < > 1.15* 1.07* 0.97  CALCIUM 8.8*  --  8.7* 9.1 8.8*  PROT 6.5  --   --   --   --   BILITOT 0.9  --   --   --   --   ALKPHOS 122  --   --   --   --   ALT 12*  --   --   --   --   AST 18  --   --   --   --   GLUCOSE 95  --  130* 108* 76  < > = values in this interval not displayed.  Imaging/Diagnostic Tests: Dg Chest 2 View  Result Date: 11/24/2015 CLINICAL DATA:  Shortness of breath, wheezing, productive cough for 1 year. History of COPD, emphysema, congestive heart failure, hypertension. EXAM: CHEST  2 VIEW COMPARISON:  08/19/2015 FINDINGS: Mild cardiac enlargement without pulmonary vascular congestion or edema. Previous right lung infiltrates have resolved. Lungs appear clear and expanded today. No focal consolidation or airspace disease. No blunting of costophrenic angles. No pneumothorax. Tortuous aorta. Degenerative changes in the spine. IMPRESSION: No active cardiopulmonary disease. Electronically Signed   By: Lucienne Capers M.D.   On: 11/24/2015 21:36    Lovenia Kim, MD 11/26/2015, 12:54 PM PGY-1, [facility] Intern pager: [phone], text pages welcome

## 2015-11-26 NOTE — Consult Note (Signed)
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   Fair  Judgement:  Impaired  Insight:  Fair  Psychomotor Activity:  Decreased  Concentration:  Concentration: Fair and Attention Span: Fair  Recall:  Good  Fund of Knowledge:  Fair  Language:  Good  Akathisia:  Negative  Handed:  Right  AIMS (if indicated):     Assets:  Communication Skills Desire for Improvement Financial Resources/Insurance Housing Leisure Time Resilience Social Support Transportation  ADL's:  Intact  Cognition:  WNL  Sleep:        Treatment Plan Summary: 55 years old female with the bipolar disorder, crack cocaine use disorder recently admitted for cocaine-induced psychosis and tardive dyskinesia secondary to cocaine intoxication. Urine drug screen is positive for cocaine and tetrahydrocannabinol. Patient  does not meet criteria for acute psychiatric hospitalization as she has no current symptoms of psychosis, suicidal/homicidal ideation, intention or plans.  Patient has no safety concerns. Patient has been improved/sober from cocaine and tetrahydrocannabinol during evaluation She continued to have protrusion of the tongue very frequently during my observation She also has cracked and dry lips Patient willing to participate in residential substance abuse treatment program when medically cleared  Referred to the unit social service for appropriate resistances substance abuse treatment program  Daily contact with patient to assess and evaluate symptoms and progress in treatment and Medication management   Appreciate psychiatric consultation and we sign off as of today Please contact [phone] or [phone] if needs further assistance   Disposition: No evidence of imminent risk to self or others at present.   Patient does not meet criteria for psychiatric inpatient admission. Supportive therapy provided about ongoing stressors.  Ambrose Finland, MD 11/26/2015 9:22 AM

## 2015-11-26 NOTE — Progress Notes (Signed)
Patient psych assessment office called this morning to verify that patient will be seen today since she was not seen yesterday. Patient is down to be evaluated today.

## 2015-11-27 LAB — CBC
HCT: 36.6 % (ref 36.0–46.0)
Hemoglobin: 11.4 g/dL — ABNORMAL LOW (ref 12.0–15.0)
MCH: 27 pg (ref 26.0–34.0)
MCHC: 31.1 g/dL (ref 30.0–36.0)
MCV: 86.7 fL (ref 78.0–100.0)
Platelets: 502 10*3/uL — ABNORMAL HIGH (ref 150–400)
RBC: 4.22 MIL/uL (ref 3.87–5.11)
RDW: 16.8 % — ABNORMAL HIGH (ref 11.5–15.5)
WBC: 12.5 10*3/uL — ABNORMAL HIGH (ref 4.0–10.5)

## 2015-11-27 LAB — BASIC METABOLIC PANEL
Anion gap: 8 (ref 5–15)
BUN: 8 mg/dL (ref 6–20)
CO2: 24 mmol/L (ref 22–32)
Calcium: 9.6 mg/dL (ref 8.9–10.3)
Chloride: 108 mmol/L (ref 101–111)
Creatinine, Ser: 1.02 mg/dL — ABNORMAL HIGH (ref 0.44–1.00)
GFR calc Af Amer: >60 mL/min (ref >60)
GFR calc non Af Amer: >60 mL/min (ref >60)
Glucose, Bld: 117 mg/dL — ABNORMAL HIGH (ref 65–99)
Potassium: 4.5 mmol/L (ref 3.5–5.1)
Sodium: 140 mmol/L (ref 135–145)

## 2015-11-27 LAB — GLUCOSE, CAPILLARY
Glucose-Capillary: 153 mg/dL — ABNORMAL HIGH (ref 65–99)
Glucose-Capillary: 115 mg/dL — ABNORMAL HIGH (ref 65–99)
Glucose-Capillary: 131 mg/dL — ABNORMAL HIGH (ref 65–99)
Glucose-Capillary: 101 mg/dL — ABNORMAL HIGH (ref 65–99)

## 2015-11-27 LAB — MAGNESIUM: Magnesium: 1.9 mg/dL (ref 1.7–2.4)

## 2015-11-27 MED ORDER — HYDROXYZINE HCL 10 MG PO TABS: 10.0000 mg | ORAL_TABLET | Freq: Four times a day (QID) | ORAL | Status: DC | PRN

## 2015-11-27 MED ORDER — MAGNESIUM SULFATE IN D5W 1-5 GM/100ML-% IV SOLN
1.0000 g | Freq: Once | INTRAVENOUS | Status: AC
  Administered 2015-11-27: 1 g via INTRAVENOUS
  Filled 2015-11-27: qty 100

## 2015-11-27 MED ORDER — LORAZEPAM 1 MG PO TABS
1.0000 mg | ORAL_TABLET | Freq: Once | ORAL | Status: AC
Start: 2015-11-27 — End: 2015-11-27
  Administered 2015-11-27: 1 mg via ORAL
  Filled 2015-11-27: qty 1

## 2015-11-27 NOTE — Progress Notes (Signed)
PT Cancellation Note  Patient Details Name: Tiffany [name] MRN: [id number] DOB: [birth year]   Cancelled Treatment:    Reason Eval/Treat Not Completed: PT screened, no needs identified, will sign off. Per pt's nurse and nurse tech, pt has been ambulating within her room independently. No acute PT needs identified at this time. PT signing off at this time.    [place] 11/27/2015, 3:06 PM Sherie Don, [place], DPT [phone]

## 2015-11-27 NOTE — Progress Notes (Signed)
Family Medicine Teaching Service Daily Progress Note Intern Pager: [phone]  Patient name: Tiffany [name] Medical record number: [id number] Date of birth: 04-19-1960 Age: 55 y.o. Gender: female  Primary Care Provider: Kathrine Cords, MD Consultants: Psychiatry Code Status: FULL  Pt Overview and Major Events to Date:   Assessment and Plan:  #COPD exac.  Noted to be wheezy on exam 11/10.  Was given Prednisone in ED when initially presented but was not continued on admission.  -Continue Prednisone 50 mg daily -Albuterol prn  #Chest pain.  Resolved.  -Troponins 0.06>0.03>0.03   #Crack cocaine use.  Patient states she would like to seek inpatient treatment as she has tried outpatient and failed.  -will contact SW and psychiatrist to look into inpatient vs outpatient substance abuse programs  #Electrolyte abnormalities.  Improving.  Lexapro may be contributing to hyponatremia.   Random urine Na+ 41.  Urine osmolality 208.   Likely renal losses (adrenal vs diuretic induced vs cerebral salt wasting).  Diuretic induced unlikely given she is on Lasix, not Thiazide.  Cerebral salt wasting also less likely as no known history of head trauma.   -Na+ 126>140 this AM.  K+ 2.9>4.1>4.5 this AM. Magnesium low at 1.4>1.9.  -Gave 1 g Mag this AM -Check AM BMET and Magnesium  -Replete electrolytes as needed  #Homocidal ideation/Suicidal ideation.  Continues to endorse both.  Has a sitter bedside.  Does not have a specific plan. -Psychiatric c/s placed - patient denied SI/HI. Psych said no active concerns and does not meet criteria for inpatient treatment, would benefit from outpatient.  -Follow up on plan for treatment once discharged. Patient follows outpatient with Psych.  -PT/OT to see  #AKI.  Resolved.  Creatinine this AM 165>0.97>1.02, from baseline 0.90. -Stop fluids today as patient euvolemic on exam this AM  -Assess fluid status  #Bipolar 1 disorder.  On half dose of home medications.   With new symptom this AM of tongue movements/lip smacking.  Will discuss with psych, appreciate recommendations.  -Continue Lexapro 10 mg po daily -Continue Remeron 15 mg po daily -Continue Geodon 80 mg daily  #Prolonged QTc.  This AM 499.  Continue to avoid medications that prolong QTc. - Bipolar medication dosages have been decreased to half home dose.  -Continue to monitor  #CHF.  Stable.  BNP 68 on admission.  With no significant LE edema on exam. Recent admission in August 2017 with Echo showing EF 55-60%, G1DD.  At home on Lasix 40 mg daily.  -Hold home Lasix in setting of her dehydration -patient euvolemic on exam -continue to monitor  #T2DM.  Hgb A1c on 06/01/15 of 5.6.  At home on Metformin 500 mg BID.  CBGs have been stable.  -Moderate SSI  -Monitor CBGs  -Neurontin 600 mg TID for DM neuropathy   #HTN.  BP on admission 165/76.  At home on Norvasc 5 mg bid, Verapamil 240 mg BID and Lisinopril 40 mg daily.   Is on 2 CCBs, will hold Verapamil for now -Continue home Norvasc 10 mg daily -monitor blood pressures  #STD testing .  Patient requested in ED as she has recent history of unprotected sex about 3 months ago.  Was given one dose of AZT and CTX in ED.  -GC/Chlamydia, HIV, RPR all negative.    FEN/GI: heart healthy/carb modified PPx: Lovenox  Disposition: Dispo pending clinical improvement   Subjective:  Patient states she is feeling homicidal and suicidal.  Stating she must go to inpatient rehab because she has  failed outpatient treatment.  Continues to hear voices.  Still has involuntary TD-movements of tongue.    Objective: Temp:  [97.6 F (36.4 C)-98.6 F (37 C)] 97.6 F (36.4 C) (11/11 1209) Pulse Rate:  [68-91] 91 (11/11 1209) Resp:  [18-20] 20 (11/11 1209) BP: (130-179)/(76-96) 179/96 (11/11 1209) SpO2:  [95 %-98 %] 95 % (11/11 1209) Weight:  [228 lb 9.6 oz (103.7 kg)] 228 lb 9.6 oz (103.7 kg) (11/11 0535) Physical Exam: General: obese, african-american  female laying in bed in NAD, repetitive tongue and lip smacking movements, on [place].  With sitter at bedside. Cardiovascular: RRR, no MRG Respiratory: Mild wheezing on exam, comfortable work of breathing via [place] Abdomen: obese, soft, NT, ND +bs Extremities: no edema or tenderness Psych: still endorsing SI and HI, still hearing voices  Laboratory:  Recent Labs Lab 11/25/15 0756 11/26/15 0359 11/27/15 0533  WBC 12.2* 10.6* 12.5*  HGB 11.0* 9.9* 11.4*  HCT 34.0* 31.3* 36.6  PLT 420* 400 502*    Recent Labs Lab 11/24/15 2116  11/25/15 1405 11/26/15 0359 11/27/15 0533  NA 126*  < > 134* 140 140  K 2.9*  < > 4.1 4.1 4.5  CL 92*  < > 102 108 108  CO2 23  < > 22 26 24   BUN 12  < > 8 8 8   CREATININE 1.65*  < > 1.07* 0.97 1.02*  CALCIUM 8.8*  < > 9.1 8.8* 9.6  PROT 6.5  --   --   --   --   BILITOT 0.9  --   --   --   --   ALKPHOS 122  --   --   --   --   ALT 12*  --   --   --   --   AST 18  --   --   --   --   GLUCOSE 95  < > 108* 76 117*  < > = values in this interval not displayed.  Imaging/Diagnostic Tests: Dg Chest 2 View  Result Date: 11/24/2015 CLINICAL DATA:  Shortness of breath, wheezing, productive cough for 1 year. History of COPD, emphysema, congestive heart failure, hypertension. EXAM: CHEST  2 VIEW COMPARISON:  08/19/2015 FINDINGS: Mild cardiac enlargement without pulmonary vascular congestion or edema. Previous right lung infiltrates have resolved. Lungs appear clear and expanded today. No focal consolidation or airspace disease. No blunting of costophrenic angles. No pneumothorax. Tortuous aorta. Degenerative changes in the spine. IMPRESSION: No active cardiopulmonary disease. Electronically Signed   By: Lucienne Capers M.D.   On: 11/24/2015 21:36   Lovenia Kim, MD 11/27/2015, 1:46 PM PGY-1, [place] Intern pager: [phone], text pages welcome

## 2015-11-27 NOTE — Progress Notes (Addendum)
[name] received referral and provided outpt sub abuse and residential treatment center resources to pt, pt not happy. Pt reports that she is surprised, reports Psychiatrist told her she needed inpt treatment. Per Psychiatrist notes, pt does not meet criteria for inpt, no SI/HI. [name] will remain available for pt/family as needed.  [name] was also notified by nurse that pt's son is upset that pt cannot go to a inpt facility. [name] called pt's son to discuss, no answer, [name] left contact information.  [name] received return call from pt's son who reported that he doesn't want mom to come home due to her being unbearable to live with.[name] discussed SA being pt's main issue at this time. [name] recommended son call residential treatment facilities to get pt assessed for possible placement. [name] advised pt does not meet criteria for inpt psych at this time, son reports that he is surprised because when he spoke to pt earlier she wasn't right. [name] advised son outpt or residential treatment is the only option unless Psychiatrist recommends otherwise. [name] encouraged son to try to help pt get stable and to follow through with getting pt's medication managed and placement in outpt services.  [name] went back to speak to pt and discussed the benefits of outpt services. [name] discussed the importance of pt taking meds on a regular basis so that symptoms could stabilize. Pt not happy and reports that she wants to see a different Psychiatrist, feels she wasn't assessed properly and will use once she discharges. [name] updated nurse. Pt has list of resources at bedside.  [name] is signing off.  Liliana Dang B. Paulette Blanch [phone]

## 2015-11-28 DIAGNOSIS — F1420 Cocaine dependence, uncomplicated: Secondary | ICD-10-CM

## 2015-11-28 DIAGNOSIS — Z8249 Family history of ischemic heart disease and other diseases of the circulatory system: Secondary | ICD-10-CM

## 2015-11-28 DIAGNOSIS — F3164 Bipolar disorder, current episode mixed, severe, with psychotic features: Secondary | ICD-10-CM

## 2015-11-28 DIAGNOSIS — R45850 Homicidal ideations: Secondary | ICD-10-CM

## 2015-11-28 DIAGNOSIS — R45851 Suicidal ideations: Secondary | ICD-10-CM

## 2015-11-28 DIAGNOSIS — Z7982 Long term (current) use of aspirin: Secondary | ICD-10-CM

## 2015-11-28 DIAGNOSIS — F1911 Other psychoactive substance abuse, in remission: Secondary | ICD-10-CM

## 2015-11-28 HISTORY — DX: Other psychoactive substance abuse, in remission: F19.11

## 2015-11-28 LAB — CBC
HCT: 33.1 % — ABNORMAL LOW (ref 36.0–46.0)
Hemoglobin: 10.3 g/dL — ABNORMAL LOW (ref 12.0–15.0)
MCH: 27 pg (ref 26.0–34.0)
MCHC: 31.1 g/dL (ref 30.0–36.0)
MCV: 86.9 fL (ref 78.0–100.0)
Platelets: 477 10*3/uL — ABNORMAL HIGH (ref 150–400)
RBC: 3.81 MIL/uL — ABNORMAL LOW (ref 3.87–5.11)
RDW: 17.3 % — ABNORMAL HIGH (ref 11.5–15.5)
WBC: 16.7 10*3/uL — ABNORMAL HIGH (ref 4.0–10.5)

## 2015-11-28 LAB — GLUCOSE, CAPILLARY
Glucose-Capillary: 102 mg/dL — ABNORMAL HIGH (ref 65–99)
Glucose-Capillary: 101 mg/dL — ABNORMAL HIGH (ref 65–99)
Glucose-Capillary: 115 mg/dL — ABNORMAL HIGH (ref 65–99)
Glucose-Capillary: 93 mg/dL (ref 65–99)

## 2015-11-28 LAB — BASIC METABOLIC PANEL
Anion gap: 6 (ref 5–15)
BUN: 12 mg/dL (ref 6–20)
CO2: 26 mmol/L (ref 22–32)
Calcium: 9.4 mg/dL (ref 8.9–10.3)
Chloride: 108 mmol/L (ref 101–111)
Creatinine, Ser: 1.08 mg/dL — ABNORMAL HIGH (ref 0.44–1.00)
GFR calc Af Amer: >60 mL/min (ref >60)
GFR calc non Af Amer: 56 mL/min — ABNORMAL LOW (ref >60)
Glucose, Bld: 88 mg/dL (ref 65–99)
Potassium: 3.7 mmol/L (ref 3.5–5.1)
Sodium: 140 mmol/L (ref 135–145)

## 2015-11-28 MED ORDER — ZOLPIDEM TARTRATE 5 MG PO TABS
5.0000 mg | ORAL_TABLET | Freq: Every evening | ORAL | Status: DC | PRN
  Administered 2015-11-29 – 2015-11-30 (×2): 5 mg via ORAL
  Filled 2015-11-28 (×2): qty 1

## 2015-11-28 MED ORDER — PREDNISONE 50 MG PO TABS
50.0000 mg | ORAL_TABLET | Freq: Every day | ORAL | Status: AC
  Administered 2015-11-28 – 2015-11-29 (×2): 50 mg via ORAL
  Filled 2015-11-28 (×2): qty 1

## 2015-11-28 MED ORDER — GUAIFENESIN ER 600 MG PO TB12
600.0000 mg | ORAL_TABLET | Freq: Two times a day (BID) | ORAL | Status: DC
  Administered 2015-11-28 – 2015-12-01 (×7): 600 mg via ORAL
  Filled 2015-11-28 (×7): qty 1

## 2015-11-28 MED ORDER — ALBUTEROL SULFATE (2.5 MG/3ML) 0.083% IN NEBU
3.0000 mL | INHALATION_SOLUTION | RESPIRATORY_TRACT | Status: AC
  Administered 2015-11-28 – 2015-11-29 (×3): 3 mL via RESPIRATORY_TRACT
  Filled 2015-11-28 (×5): qty 3

## 2015-11-28 MED ORDER — FLUOXETINE HCL 10 MG PO CAPS
10.0000 mg | ORAL_CAPSULE | Freq: Every day | ORAL | Status: DC
  Administered 2015-11-29 – 2015-11-30 (×2): 10 mg via ORAL
  Filled 2015-11-28 (×3): qty 1

## 2015-11-28 NOTE — Consult Note (Signed)
[facility]iatry Consult   Reason for Consult:  Suicidal/homicidal thought and psychosis Referring Physician: Dr. Grayland Jack Patient Identification: Tiffany [name] MRN:  [id number] Principal Diagnosis: Bipolar affective disorder, mixed, severe, with psychotic behavior ([place]) Diagnosis:   Patient Active Problem List   Diagnosis Date Noted  . Bipolar affective disorder, mixed, severe, with psychotic behavior ([place]) [F31.64] 11/28/2015  . Cocaine use disorder, severe, dependence ([place]) [F14.20] 11/28/2015  . Cocaine abuse [F14.10]   . Psychosis [F29]   . Auditory hallucinations [R44.0]   . Hyponatremia [E87.1] 11/25/2015  . AKI (acute kidney injury) ([place]) [N17.9] 11/24/2015  . Blood in sputum [R04.2]   . Acute on chronic congestive heart failure ([place]) [I50.9]   . SOB (shortness of breath) [R06.02]   . Hyperlipidemia [E78.5]   . Hypoxia [R09.02] 08/19/2015  . COPD with acute exacerbation ([place]) [J44.1] 08/19/2015  . Acute on chronic respiratory failure with hypoxia and hypercapnia (HCC) [S97.02, J96.22] 06/22/2015  . Acute respiratory failure with hypoxemia ([place]) [J96.01] 06/22/2015  . Scabies [B86] 06/01/2015  . Onychomycosis [B35.1] 01/21/2015  . Nasal congestion [R09.81] 12/17/2014  . Type 2 diabetes mellitus with complication (HCC) [O37.8]   . Tobacco abuse [Z72.0] 07/22/2014  . COPD (chronic obstructive pulmonary disease) ([place]) [J44.9] 07/08/2014  . COPD exacerbation ([place]) [J44.1] 06/26/2014  . Seborrheic keratoses [L82.1] 12/31/2013  . Thrush [B37.0] 09/19/2013  . Knee pain, bilateral [M25.561, M25.562] 01/22/2013  . Seizure ([place]) [R56.9] 01/04/2013  . Health care maintenance [Z00.00] 11/25/2012  . Folliculitis [H88.5] 02/77/4128  . History of kidney stones [Z87.442] 06/18/2012  . Chronic pain syndrome [G89.4] 06/18/2012  . Muscle spasm [M62.838] 06/18/2012  . Elevated troponin [R74.8] 04/28/2012  . Asthma with acute exacerbation [J45.901] 04/28/2012  . Allergic  reaction [T78.40XA] 04/07/2012  . Bipolar 1 disorder ([place]) [F31.9] 12/08/2011  . Itching [L29.9] 09/06/2011  . HSV infection [B00.9] 08/30/2011  . Dyslipidemia [E78.5] 04/24/2011  . Anemia [D64.9] 04/24/2011  . Depression with anxiety [F41.8] 04/24/2011  . Diabetic neuropathy ([place]) [E11.40] 04/24/2011  . OSA and COPD overlap syndrome ([place]) [G47.33, J44.9] 10/18/2010  . Asthma [493] 10/18/2010  . Obesity [E66.9] 10/18/2010  . HTN (hypertension) [I10] 10/18/2010  . Type 2 diabetes mellitus ([place]) [E11.9] 10/18/2010    Total Time spent with patient: 1 hour  Subjective:   Tiffany [name] is a 55 y.o. female patient admitted with psychosis and cocaine addiction.  HPI: Patient is a 40 old female with history of Bipolar depression,  Cocaine use disorder, COPD, CHF, diabetes, hypertension, sleep apnea, CAD and HLD. Patient reports that she came to the hospital to request for treatment from Cocaine and Bipolar disorder. She reports daily use of Cocaine in the last few months and occasional use of Cannabis. She reports that she has tried to quit without success. Also, she reports worsening depression, mood swings, getting easily agitated, seeing things, suicidal and homicidal thoughts. Patient denies delusions but endorses difficulty sleeping and excessive food consumption. Patient is unable to contract for safety.  Past Psychiatric History: as above  Risk to Self: Suicidal Ideation: yes Suicidal Intent: yes Is patient at risk for suicide?: Yes Suicidal Plan?: No Access to Means: No What has been your use of drugs/alcohol within the last 12 months?: Pt reported using $300 worth of crack.  How many times?:  (0) Other Self Harm Risks: NA Triggers for Past Attempts: None known Intentional Self Injurious Behavior: None Risk to Others: Homicidal Ideation: yes Thoughts of Harm to Others: yes Current Homicidal Intent: yes  Current Homicidal Plan: No Access to Homicidal Means: No Identified  Victim:  (NA) History of harm to others?: No Assessment of Violence: None Noted Violent Behavior Description: NA Does patient have access to weapons?: No (Pt denies. ) Criminal Charges Pending?: No Does patient have a court date: Yes Court Date:  (Pt reports, 01/21/15 for larceny.) Prior Inpatient Therapy: Prior Inpatient Therapy: Yes Prior Therapy Dates:  (Unknown) Prior Therapy Facilty/Provider(s): [facility] known at [employer] [facility] Reason for Treatment: drug rehabiliation Prior Outpatient Therapy: Prior Outpatient Therapy: Yes Prior Therapy Dates:  (Current) Prior Therapy Facilty/Provider(s): Evlyn Kanner at [facility] Reason for Treatment: Counseling/Therapy Does patient have an ACCT team?: Unknown Does patient have Intensive In-House Services?  : No Does patient have [facility] services? : Unknown Does patient have P4CC services?: Unknown  Past Medical History:  Past Medical History:  Diagnosis Date  . Anxiety   . Asthma   . Cocaine abuse   . Complication of anesthesia    decreased bp, decreased heart rate  . Diabetes mellitus    class 2  . Disorder of nervous system   . Emphysema   . Heart attack   . High blood pressure   . High cholesterol   . Hypertension   . Incontinence   . Manic depression ([place])   . Myocardial infarct   . Pneumonia 06/2015  . Seasonal allergies   . Seizures ([place])   . Sinus trouble   . Sleep apnea   . Stroke [facility])     Past Surgical History:  Procedure Laterality Date  . ABDOMINAL HYSTERECTOMY    . CESAREAN SECTION    . HIATAL HERNIA REPAIR     Family History:  Family History  Problem Relation Age of Onset  . Cancer Father     prostate  . Cancer Mother     lung  . Depression Mother   . Depression Sister   . Anxiety disorder Sister   . Schizophrenia Sister   . Bipolar disorder Sister   . Depression Sister   . Depression Brother   . Heart failure      cousin   Family Psychiatric  History:  Social History:   History  Alcohol Use No     History  Drug Use  . Types: Cocaine    Social History   Social History  . Marital status: Widowed    Spouse name: N/A  . Number of children: 3  . Years of education: N/A   Occupational History  . disabled     factory production   Social History Main Topics  . Smoking status: Current Every Day Smoker    Packs/day: 0.50    Years: 37.00    Types: Cigarettes    Start date: 01/16/1977  . Smokeless tobacco: Never Used     Comment: Previous 2 PPD menthol full flavor  . Alcohol use No  . Drug use:     Types: Cocaine  . Sexual activity: No   Other Topics Concern  . None   Social History Narrative  . None   Additional Social History:    Allergies:   Allergies  Allergen Reactions  . Hydroxyzine Other (See Comments)    unknown  . Codeine Nausea And Vomiting  . Sulfa Antibiotics Itching    Labs:  Results for orders placed or performed during the hospital encounter of 11/24/15 (from the past 48 hour(s))  Glucose, capillary     Status: Abnormal   Collection Time: 11/26/15  9:11 PM  Result Value Ref Range   Glucose-Capillary 105 (H) 65 - 99 mg/dL   Comment 1 Notify RN    Comment 2 Document in Chart   Basic metabolic panel     Status: Abnormal   Collection Time: 11/27/15  5:33 AM  Result Value Ref Range   Sodium 140 135 - 145 mmol/L   Potassium 4.5 3.5 - 5.1 mmol/L   Chloride 108 101 - 111 mmol/L   CO2 24 22 - 32 mmol/L   Glucose, Bld 117 (H) 65 - 99 mg/dL   BUN 8 6 - 20 mg/dL   Creatinine, Ser 1.02 (H) 0.44 - 1.00 mg/dL   Calcium 9.6 8.9 - 10.3 mg/dL   GFR calc non Af Amer >60 >60 mL/min   GFR calc Af Amer >60 >60 mL/min    Comment: (NOTE) The eGFR has been calculated using the CKD EPI equation. This calculation has not been validated in all clinical situations. eGFR's persistently <60 mL/min signify possible Chronic Kidney Disease.    Anion gap 8 5 - 15  CBC     Status: Abnormal   Collection Time: 11/27/15  5:33 AM  Result  Value Ref Range   WBC 12.5 (H) 4.0 - 10.5 K/uL   RBC 4.22 3.87 - 5.11 MIL/uL   Hemoglobin 11.4 (L) 12.0 - 15.0 g/dL   HCT 36.6 36.0 - 46.0 %   MCV 86.7 78.0 - 100.0 fL   MCH 27.0 26.0 - 34.0 pg   MCHC 31.1 30.0 - 36.0 g/dL   RDW 16.8 (H) 11.5 - 15.5 %   Platelets 502 (H) 150 - 400 K/uL  Magnesium     Status: None   Collection Time: 11/27/15  5:33 AM  Result Value Ref Range   Magnesium 1.9 1.7 - 2.4 mg/dL  Glucose, capillary     Status: Abnormal   Collection Time: 11/27/15  6:40 AM  Result Value Ref Range   Glucose-Capillary 153 (H) 65 - 99 mg/dL   Comment 1 Notify RN    Comment 2 Document in Chart   Glucose, capillary     Status: Abnormal   Collection Time: 11/27/15 11:43 AM  Result Value Ref Range   Glucose-Capillary 115 (H) 65 - 99 mg/dL  Glucose, capillary     Status: Abnormal   Collection Time: 11/27/15  4:40 PM  Result Value Ref Range   Glucose-Capillary 131 (H) 65 - 99 mg/dL  Glucose, capillary     Status: Abnormal   Collection Time: 11/27/15  9:23 PM  Result Value Ref Range   Glucose-Capillary 101 (H) 65 - 99 mg/dL   Comment 1 Notify RN    Comment 2 Document in Chart   CBC     Status: Abnormal   Collection Time: 11/28/15  3:22 AM  Result Value Ref Range   WBC 16.7 (H) 4.0 - 10.5 K/uL   RBC 3.81 (L) 3.87 - 5.11 MIL/uL   Hemoglobin 10.3 (L) 12.0 - 15.0 g/dL   HCT 33.1 (L) 36.0 - 46.0 %   MCV 86.9 78.0 - 100.0 fL   MCH 27.0 26.0 - 34.0 pg   MCHC 31.1 30.0 - 36.0 g/dL   RDW 17.3 (H) 11.5 - 15.5 %   Platelets 477 (H) 150 - 400 K/uL  Basic metabolic panel     Status: Abnormal   Collection Time: 11/28/15  3:22 AM  Result Value Ref Range   Sodium 140 135 - 145 mmol/L   Potassium 3.7 3.5 -  5.1 mmol/L    Comment: DELTA CHECK NOTED   Chloride 108 101 - 111 mmol/L   CO2 26 22 - 32 mmol/L   Glucose, Bld 88 65 - 99 mg/dL   BUN 12 6 - 20 mg/dL   Creatinine, Ser 1.08 (H) 0.44 - 1.00 mg/dL   Calcium 9.4 8.9 - 10.3 mg/dL   GFR calc non Af Amer 56 (L) >60 mL/min   GFR  calc Af Amer >60 >60 mL/min    Comment: (NOTE) The eGFR has been calculated using the CKD EPI equation. This calculation has not been validated in all clinical situations. eGFR's persistently <60 mL/min signify possible Chronic Kidney Disease.    Anion gap 6 5 - 15  Glucose, capillary     Status: Abnormal   Collection Time: 11/28/15  6:25 AM  Result Value Ref Range   Glucose-Capillary 102 (H) 65 - 99 mg/dL  Glucose, capillary     Status: Abnormal   Collection Time: 11/28/15 11:25 AM  Result Value Ref Range   Glucose-Capillary 101 (H) 65 - 99 mg/dL   Comment 1 Notify RN    Comment 2 Document in Chart   Glucose, capillary     Status: Abnormal   Collection Time: 11/28/15  3:59 PM  Result Value Ref Range   Glucose-Capillary 115 (H) 65 - 99 mg/dL    Current Facility-Administered Medications  Medication Dose Route Frequency Provider Last Rate Last Dose  . acetaminophen (TYLENOL) tablet 650 mg  650 mg Oral Q6H PRN Elberta Leatherwood, MD       Or  . acetaminophen (TYLENOL) suppository 650 mg  650 mg Rectal Q6H PRN Elberta Leatherwood, MD      . albuterol (PROVENTIL) (2.5 MG/3ML) 0.083% nebulizer solution 3 mL  3 mL Inhalation Q4H Katheren Shams, DO      . amLODipine (NORVASC) tablet 10 mg  10 mg Oral Daily Elberta Leatherwood, MD   10 mg at 11/28/15 0940  . aspirin EC tablet 81 mg  81 mg Oral Daily Elberta Leatherwood, MD   81 mg at 11/28/15 0940  . atorvastatin (LIPITOR) tablet 40 mg  40 mg Oral Daily Elberta Leatherwood, MD   40 mg at 11/28/15 0940  . bacitracin-neomycin-polymyxin-hydrocortisone (CORTISPORIN) 1 % ointment   Topical PRN Katheren Shams, DO      . enoxaparin (LOVENOX) injection 40 mg  40 mg Subcutaneous Q24H Elberta Leatherwood, MD   40 mg at 11/28/15 1215  . [START ON 11/29/2015] FLUoxetine (PROZAC) capsule 10 mg  10 mg Oral Daily Ricci Dirocco, MD      . fluticasone furoate-vilanterol (BREO ELLIPTA) 100-25 MCG/INH 1 puff  1 puff Inhalation Daily Elberta Leatherwood, MD   1 puff at 11/28/15 0758  . gabapentin  (NEURONTIN) tablet 600 mg  600 mg Oral TID Elberta Leatherwood, MD   600 mg at 11/28/15 1517  . guaiFENesin (MUCINEX) 12 hr tablet 600 mg  600 mg Oral BID Katheren Shams, DO   600 mg at 11/28/15 1517  . insulin aspart (novoLOG) injection 0-15 Units  0-15 Units Subcutaneous TID WC Elberta Leatherwood, MD   Stopped at 11/28/15 0636  . lip balm (BLISTEX) ointment   Topical PRN Ambrose Finland, MD      . montelukast (SINGULAIR) tablet 10 mg  10 mg Oral QHS Elberta Leatherwood, MD   10 mg at 11/27/15 2131  . pantoprazole (PROTONIX) EC tablet 80 mg  80 mg Oral  Daily Katheren Shams, DO   80 mg at 11/28/15 0940  . polyethylene glycol (MIRALAX / GLYCOLAX) packet 17 g  17 g Oral Daily PRN Elberta Leatherwood, MD      . predniSONE (DELTASONE) tablet 50 mg  50 mg Oral Q breakfast Dickie La, MD   50 mg at 11/28/15 0940  . sodium chloride flush (NS) 0.9 % injection 3 mL  3 mL Intravenous Q12H Elberta Leatherwood, MD   3 mL at 11/28/15 0941  . tiotropium (SPIRIVA) inhalation capsule 18 mcg  18 mcg Inhalation Daily Elberta Leatherwood, MD   18 mcg at 11/28/15 0758  . ziprasidone (GEODON) capsule 80 mg  80 mg Oral QHS Elberta Leatherwood, MD   80 mg at 11/27/15 2130  . zolpidem (AMBIEN) tablet 10 mg  10 mg Oral QHS PRN Corena Pilgrim, MD        Musculoskeletal: Strength & Muscle Tone: within normal limits Gait & Station: normal Patient leans: N/A  Psychiatric Specialty Exam: Physical Exam  Psychiatric: Her speech is normal. Her mood appears anxious. Her affect is angry. [name] agitated, aggressive and actively hallucinating. Cognition and memory are normal. She expresses impulsivity. She exhibits a depressed mood. She expresses homicidal and suicidal ideation.    Review of Systems  Constitutional: Negative.   HENT: Negative.   Eyes: Negative.   Respiratory: Positive for shortness of breath.   Cardiovascular: Negative.   Gastrointestinal: Negative.   Genitourinary: Negative.   Musculoskeletal: Positive for myalgias.  Skin: Negative.    Neurological: Negative.   Endo/Heme/Allergies: Negative.   Psychiatric/Behavioral: Positive for depression, hallucinations, substance abuse and suicidal ideas. The patient is nervous/anxious and has insomnia.     Blood pressure 139/67, pulse 79, temperature 98.1 F (36.7 C), temperature source Oral, resp. rate 18, height 5' 4" (1.626 m), weight 105.6 kg (232 lb 11.2 oz), SpO2 98 %.Body mass index is 39.94 kg/m.  General Appearance: Casual and obese  Eye Contact:  Good  Speech:  Clear and Coherent  Volume:  Increased  Mood:  Depressed, Hopeless and Irritable  Affect:  Labile  Thought Process:  Coherent  Orientation:  Full (Time, Place, and Person)  Thought Content:  Logical  Suicidal Thoughts:  Yes.  without intent/plan  Homicidal Thoughts:  Yes.  without intent/plan  Memory:  Immediate;   Good Recent;   Good Remote;   Good  Judgement:  Poor  Insight:  Shallow  Psychomotor Activity:  Increased  Concentration:  Concentration: Fair and Attention Span: Fair  Recall:  Good  Fund of Knowledge:  Good  Language:  Good  Akathisia:  No  Handed:  Right  AIMS (if indicated):     Assets:  Communication Skills Desire for Improvement  ADL's:  Intact  Cognition:  WNL  Sleep:   poor     Treatment Plan Summary: Diagnosis: Bipolar affective disorder-mixed episode with psychosis Cocaine use disorder -severe Daily contact with patient to assess and evaluate symptoms and progress in treatment, Medication management Continue Geodon 43m qhs for psychosis Continue Gabapentin 6068mtid for mood stabilization Discontinue Remeron due to CHF-Causes fluid retention Start Prozac 10 mg daily for depression Start Ambien 10 mg qhs as needed for sleep  Disposition: Recommend psychiatric Inpatient admission when medically cleared. Supportive therapy provided about ongoing stressors. Patient will benefit from placement in dual diagnosis inpatient psychiatric hospital  AkCorena Pilgrim MD 11/28/2015 5:36 PM

## 2015-11-28 NOTE — Progress Notes (Signed)
Family Medicine Teaching Service Daily Progress Note Intern Pager: [phone]  Patient name: Tiffany [name] Medical record number: [id number] Date of birth: Jul 20, 1960 Age: 55 y.o. Gender: female  Primary Care Provider: Kathrine Cords, MD Consultants: Psychiatry Code Status: FULL  Pt Overview and Major Events to Date:   Assessment and Plan: Tiffany [name] is a 55 y.o. female presenting with electrolyte abnormalities and dehydration . PMH is significant for T2DM with diabetic neuropathy,  Dyslipidemia, OSA, COPD, asthma, HTN,  Schizophrenia, depression with anxiety, obesity, bipolar 1 disorder, chronic pain syndrome, tobacco use, cocaine use.  #COPD exac. Noted to be wheezy on exam 11/10.  Was given Prednisone in ED when initially presented but was not continued on admission. Improved -Continue Prednisone 50 mg daily. Short steroid course.  -Albuterol scheduled for 4 doses then back to prn -monitor respiratory status -mucinex to help with sputum  #Leukocytosis. WBC count jumped to 16.7 today from 12.5 yesterday. Most likely 2/2 steroids that were initiated for patient's COPD exac. [name] afebrile.  -continue to monitor.   #Chest pain.  Resolved.  -Troponins 0.06>0.03>0.03   #Crack cocaine use.  Patient states she would like to seek inpatient treatment as she has tried outpatient and failed.  -[name] signed off -patient did not want any outpatient resourses  #Electrolyte abnormalities. Resolved. Lexapro may be contributing to hyponatremia. Random urine Na+ 41.  Urine osmolality 208.   Likely renal losses. Diuretic induced unlikely given she is on Lasix, not Thiazide. Cerebral salt wasting also less likely as no known history of head trauma.   -Trend BMPs -Replete electrolytes as needed  #Homocidal ideation/Suicidal ideation.  Continues to endorse both.  Has a sitter bedside.  Does not have a specific plan. Patient follows outpatient with Psych. -Psychiatric c/s placed; they did not  give any recommendations on patient's medications or TD symptoms. Do not feel patient is safe as an out[patient. Will re-consult.  -maintain sitter at bedside -suicide precautions  #AKI. Creatinine this AM 1.08, baseline 0.90. -monitor with daily labs -Assess fluid status  #Bipolar 1 disorder.  On half dose of home medications.  With new symptom this AM of tongue movements/lip smacking.  Will discuss with psych, appreciate recommendations.  -Continue Lexapro 10 mg po daily -Continue Remeron 15 mg po daily -Continue Geodon 80 mg daily  #Prolonged QTc.  -Continue to avoid medications that prolong QTc. - Bipolar medication dosages have been decreased to half home dose.  -Continue to monitor  #CHF.  Stable.  BNP 68 on admission.  With no significant LE edema on exam. Recent admission in August 2017 with Echo showing EF 55-60%, G1DD.  At home on Lasix 40 mg daily.  -Hold home Lasix in setting of her dehydration -patient euvolemic on exam -continue to monitor  #T2DM.  Hgb A1c on 06/01/15 of 5.6.  At home on Metformin 500 mg BID.  CBGs have been stable.  -Moderate SSI  -Monitor CBGs  -Neurontin 600 mg TID for DM neuropathy   #HTN.  BP on admission 165/76.  At home on Norvasc 5 mg bid, Verapamil 240 mg BID and Lisinopril 40 mg daily.   Is on 2 CCBs, will hold Verapamil for now -Continue home Norvasc 10 mg daily -monitor blood pressures  FEN/GI: heart healthy/carb modified, SLIV PPx: Lovenox  Disposition: Dispo pending psych evaluation.  Subjective:  Patient states she is feeling homicidal and suicidal.  Stating she must go to inpatient rehab because she has failed outpatient treatment. Still has involuntary TD-movements of  tongue and hand movements. Patient also endorsing  Objective: Temp:  [97.6 F (36.4 C)-98.7 F (37.1 C)] 98.7 F (37.1 C) (11/12 0417) Pulse Rate:  [70-91] 70 (11/12 0417) Resp:  [20] 20 (11/12 0417) BP: (149-179)/(80-96) 149/85 (11/12 0417) SpO2:  [94  %-97 %] 94 % (11/12 0758) Weight:  [232 lb 11.2 oz (105.6 kg)] 232 lb 11.2 oz (105.6 kg) (11/12 0417) Physical Exam: General: obese, african-american female laying in bed in NAD, repetitive tongue and lip smacking movements.  With sitter at bedside. Cardiovascular: RRR, no MRG Respiratory: Rhonchi on exam, no wheezing, comfortable work of breathing on RA Abdomen: obese, soft, NT, ND +bs Extremities: no edema or tenderness, repetitive hand movements Psych: still endorsing SI and HI, still hearing voices  Laboratory:  Recent Labs Lab 11/26/15 0359 11/27/15 0533 11/28/15 0322  WBC 10.6* 12.5* 16.7*  HGB 9.9* 11.4* 10.3*  HCT 31.3* 36.6 33.1*  PLT 400 502* 477*    Recent Labs Lab 11/24/15 2116  11/26/15 0359 11/27/15 0533 11/28/15 0322  NA 126*  < > 140 140 140  K 2.9*  < > 4.1 4.5 3.7  CL 92*  < > 108 108 108  CO2 23  < > 26 24 26   BUN 12  < > 8 8 12   CREATININE 1.65*  < > 0.97 1.02* 1.08*  CALCIUM 8.8*  < > 8.8* 9.6 9.4  PROT 6.5  --   --   --   --   BILITOT 0.9  --   --   --   --   ALKPHOS 122  --   --   --   --   ALT 12*  --   --   --   --   AST 18  --   --   --   --   GLUCOSE 95  < > 76 117* 88  < > = values in this interval not displayed.  Imaging/Diagnostic Tests: Dg Chest 2 View  Result Date: 11/24/2015 CLINICAL DATA:  Shortness of breath, wheezing, productive cough for 1 year. History of COPD, emphysema, congestive heart failure, hypertension. EXAM: CHEST  2 VIEW COMPARISON:  08/19/2015 FINDINGS: Mild cardiac enlargement without pulmonary vascular congestion or edema. Previous right lung infiltrates have resolved. Lungs appear clear and expanded today. No focal consolidation or airspace disease. No blunting of costophrenic angles. No pneumothorax. Tortuous aorta. Degenerative changes in the spine. IMPRESSION: No active cardiopulmonary disease. Electronically Signed   By: Lucienne Capers M.D.   On: 11/24/2015 21:36   Katheren Shams, DO 11/28/2015, 8:29  AM PGY-3, [place] Intern pager: [phone], text pages welcome

## 2015-11-29 ENCOUNTER — Telehealth: Payer: Self-pay | Admitting: Family Medicine

## 2015-11-29 LAB — GLUCOSE, CAPILLARY
Glucose-Capillary: 84 mg/dL (ref 65–99)
Glucose-Capillary: 94 mg/dL (ref 65–99)
Glucose-Capillary: 126 mg/dL — ABNORMAL HIGH (ref 65–99)
Glucose-Capillary: 131 mg/dL — ABNORMAL HIGH (ref 65–99)

## 2015-11-29 LAB — CBC
HCT: 32.8 % — ABNORMAL LOW (ref 36.0–46.0)
Hemoglobin: 10.1 g/dL — ABNORMAL LOW (ref 12.0–15.0)
MCH: 27 pg (ref 26.0–34.0)
MCHC: 30.8 g/dL (ref 30.0–36.0)
MCV: 87.7 fL (ref 78.0–100.0)
Platelets: 476 10*3/uL — ABNORMAL HIGH (ref 150–400)
RBC: 3.74 MIL/uL — ABNORMAL LOW (ref 3.87–5.11)
RDW: 17.2 % — ABNORMAL HIGH (ref 11.5–15.5)
WBC: 16.3 10*3/uL — ABNORMAL HIGH (ref 4.0–10.5)

## 2015-11-29 LAB — BASIC METABOLIC PANEL
Anion gap: 6 (ref 5–15)
BUN: 20 mg/dL (ref 6–20)
CO2: 26 mmol/L (ref 22–32)
Calcium: 9.2 mg/dL (ref 8.9–10.3)
Chloride: 109 mmol/L (ref 101–111)
Creatinine, Ser: 1.11 mg/dL — ABNORMAL HIGH (ref 0.44–1.00)
GFR calc Af Amer: >60 mL/min (ref >60)
GFR calc non Af Amer: 54 mL/min — ABNORMAL LOW (ref >60)
Glucose, Bld: 114 mg/dL — ABNORMAL HIGH (ref 65–99)
Potassium: 3.6 mmol/L (ref 3.5–5.1)
Sodium: 141 mmol/L (ref 135–145)

## 2015-11-29 LAB — MAGNESIUM: Magnesium: 1.8 mg/dL (ref 1.7–2.4)

## 2015-11-29 MED ORDER — POTASSIUM CHLORIDE CRYS ER 20 MEQ PO TBCR
40.0000 meq | EXTENDED_RELEASE_TABLET | Freq: Two times a day (BID) | ORAL | Status: AC
  Administered 2015-11-29 (×2): 40 meq via ORAL
  Filled 2015-11-29 (×2): qty 2

## 2015-11-29 NOTE — Telephone Encounter (Signed)
Pt scheduled diabetic eye exam from [facility] 12/24/15 at 3pm [place]

## 2015-11-29 NOTE — Care Management Important Message (Signed)
Important Message  Patient Details  Name: Tiffany [name] MRN: [id number] Date of Birth: [birth year]   Medicare Important Message Given:  Yes    Meshia Rau 11/29/2015, 11:28 AM

## 2015-11-29 NOTE — Evaluation (Signed)
Occupational Therapy Evaluation Patient Details Name: Tiffany [name] MRN: [id number] DOB: December 26, 1960 Today's Date: 11/29/2015    History of Present Illness Pt admitted with chest pain. PMH: schizophrenia, bipolar disorder, substance abuse, DM, neuropathy, OAS, COPD, asthma, HTN, CAD, CHF.[name]   Clinical Impression   Pt was cooperative throughout session and able to offer details about her home set up and PLOF. Pt is able to perform self care and mobilize independently. Per sitter, who is familiar with pt, she has improved significantly since the end of las week. Np OT needs identified.    Follow Up Recommendations  No OT follow up    Equipment Recommendations       Recommendations for Other Services       Precautions / Restrictions Precautions Precautions: None      Mobility Bed Mobility                  Transfers Overall transfer level: Independent                    Balance Overall balance assessment: Needs assistance   Sitting balance-Leahy Scale: Good       Standing balance-Leahy Scale: Fair                              ADL Overall ADL's : Independent (with set up, sponge bathing)                                             Vision     Perception     Praxis      Pertinent Vitals/Pain Pain Assessment: No/denies pain     Hand Dominance Right   Extremity/Trunk Assessment Upper Extremity Assessment Upper Extremity Assessment: Overall WFL for tasks assessed   Lower Extremity Assessment Lower Extremity Assessment: Overall WFL for tasks assessed       Communication Communication Communication: No difficulties   Cognition Arousal/Alertness: Awake/alert Behavior During Therapy: Flat affect Overall Cognitive Status: Within Functional Limits for tasks assessed                     General Comments       Exercises       Shoulder Instructions      Home Living Family/patient expects to  be discharged to:: Private residence Living Arrangements: Children;Other relatives;Non-relatives/Friends Available Help at Discharge: Family;Available 24 hours/day Type of Home: House Home Access: Ramped entrance     Home Layout: One level     Bathroom Shower/Tub: Teacher, early years/pre: Handicapped height     Home Equipment: Grab bars - tub/shower          Prior Functioning/Environment Level of Independence: Independent        Comments: sometimes needs help with LB dressing, does not cook or do housework, states she drives        OT Problem List: Obesity;Decreased safety awareness   OT Treatment/Interventions:      OT Goals(Current goals can be found in the care plan section)    OT Frequency:     Barriers to D/C:            Co-evaluation              End of Session    Activity Tolerance: Patient tolerated treatment well  Patient left: in chair;with call bell/phone within reach;with nursing/sitter in room   Time: 1005-1021 OT Time Calculation (min): 16 min Charges:  OT General Charges $OT Visit: 1 Procedure OT Evaluation $OT Eval Moderate Complexity: 1 Procedure G-Codes:    Malka So 11/29/2015, 10:29 AM  [phone]

## 2015-11-29 NOTE — Progress Notes (Signed)
Family Medicine Teaching Service Daily Progress Note Intern Pager: [phone]  Patient name: Tiffany [name] Medical record number: [id number] Date of birth: [birth year] Age: 55 y.o. Gender: female  Primary Care Provider: Kathrine Cords, MD  Consultants: Psychiatry  Code Status: FULL   Pt Overview and Major Events to Date:   Assessment and Plan:  #COPD exac.  Improving. Noted to be wheezy on exam 11/10.  Was given Prednisone in ED when initially presented but was not continued on admission.  -Continue Prednisone 50 mg daily  -Albuterol prn  #Chest pain.  Resolved.  -Troponins 0.06>0.03>0.03   #Crack cocaine use.  Patient states she would like to seek inpatient treatment as she has tried outpatient and failed.  -will contact SW and psychiatrist to look into inpatient vs outpatient substance abuse programs  #Electrolyte abnormalities.  Improving.  Lexapro may be contributing to hyponatremia, will discontinue for now.   Random urine Na+ 41.  Urine osmolality 208.   Likely renal losses (adrenal vs diuretic induced vs cerebral salt wasting).  Diuretic induced unlikely given she is on Lasix, not Thiazide.  Cerebral salt wasting also less likely as no known history of head trauma.   -Na+ 126>140>141 this AM.  K+ 2.9>4.1>4.5>3.6 this AM. Magnesium low at 1.4>1.9.  -Check Mag this AM -Gave Kdur 40 mEq x 2  -Check AM BMET -Replete electrolytes as needed  #Homocidal ideation/Suicidal ideation.  Continues to endorse both.  Has a sitter bedside.  Does not have a specific plan. -Psychiatric c/s, appreciate recs --> recommend inpatient treatment, add Prozac and Ambien, discontinue Remeron.  Dispo pending inpatient bed becomes available.  Patient medically stable for discharge to inpatient psych.  -PT- no needs identified -OT to see  #AKI.  Resolved.  Creatinine this AM 165>0.97>1.02>1.11, from baseline 0.90. -continue to monitor  #Bipolar 1 disorder/Depression.  On half dose of home  medications.  With new symptoms of tongue movements/lip smacking.  Will discuss with psych, appreciate recommendations.  Discontinue Lexapro as could be contributing to hyponatremia.  -Discontinue Remeron (per Psych recs) due to CHF, causes fluid retention -Continue Geodon 80 mg daily  -Start Prozac 10 mg daily for depression -Start Ambien 5 mg qhs as needed for sleep  #Prolonged QTc.  This AM 499.  Continue to avoid medications that prolong QTc. - Bipolar medication dosages have been decreased to half home dose.  -Continue to monitor   #CHF.  Stable.  BNP 68 on admission.  With no significant LE edema on exam. Recent admission in August 2017 with Echo showing EF 55-60%, G1DD.  At home on Lasix 40 mg daily.  -Hold home Lasix for now as she is euvolemic -continue to monitor   #T2DM.  Hgb A1c on 06/01/15 of 5.6.  At home on Metformin 500 mg BID.  CBGs have been stable.  -Moderate SSI  -Monitor CBGs  -Neurontin 600 mg TID for DM neuropathy   #HTN.  BP on admission 141/88.  At home on Norvasc 5 mg bid, Verapamil 240 mg BID and Lisinopril 40 mg daily.   Is on 2 CCBs, will hold Verapamil for now -Continue home Norvasc 10 mg daily -monitor blood pressures  #STD testing . Resolved.  Patient requested in ED as she has recent history of unprotected sex about 3 months ago.  Was given one dose of AZT and CTX in ED.  -GC/Chlamydia, HIV, RPR all negative.    FEN/GI: heart healthy/carb modified PPx: Lovenox  Disposition: Patient medically stable for discharge to inpatient psych.  Subjective:  Patient states she is feeling homicidal and suicidal.  Continues to hear voices.   Objective: Temp:  [98.1 F (36.7 C)-98.6 F (37 C)] 98.6 F (37 C) (11/13 0553) Pulse Rate:  [68-79] 68 (11/13 0553) Resp:  [18-20] 18 (11/13 0553) BP: (139-141)/(67-88) 141/88 (11/13 0553) SpO2:  [94 %-98 %] 96 % (11/13 0553) Weight:  [236 lb (107 kg)] 236 lb (107 kg) (11/13 0553) Physical Exam: General: obese,  laying in bed in NAD, repetitive tongue and lip smacking movements, on [place].  With sitter at bedside. Cardiovascular: RRR, no MRG Respiratory: Mild wheezing on exam, comfortable work of breathing via [place] Abdomen: obese, soft, NT, ND +bs Extremities: no edema or tenderness Psych: still endorsing SI and HI, still hearing voices  Laboratory:  Recent Labs Lab 11/27/15 0533 11/28/15 0322 11/29/15 0248  WBC 12.5* 16.7* 16.3*  HGB 11.4* 10.3* 10.1*  HCT 36.6 33.1* 32.8*  PLT 502* 477* 476*    Recent Labs Lab 11/24/15 2116  11/27/15 0533 11/28/15 0322 11/29/15 0248  NA 126*  < > 140 140 141  K 2.9*  < > 4.5 3.7 3.6  CL 92*  < > 108 108 109  CO2 23  < > 24 26 26   BUN 12  < > 8 12 20   CREATININE 1.65*  < > 1.02* 1.08* 1.11*  CALCIUM 8.8*  < > 9.6 9.4 9.2  PROT 6.5  --   --   --   --   BILITOT 0.9  --   --   --   --   ALKPHOS 122  --   --   --   --   ALT 12*  --   --   --   --   AST 18  --   --   --   --   GLUCOSE 95  < > 117* 88 114*  < > = values in this interval not displayed.  Imaging/Diagnostic Tests: No results found. Lovenia Kim, MD 11/29/2015, 7:07 AM PGY-1, [place] Intern pager: [phone], text pages welcome

## 2015-11-30 LAB — CBC
HCT: 31.8 % — ABNORMAL LOW (ref 36.0–46.0)
Hemoglobin: 9.7 g/dL — ABNORMAL LOW (ref 12.0–15.0)
MCH: 26.8 pg (ref 26.0–34.0)
MCHC: 30.5 g/dL (ref 30.0–36.0)
MCV: 87.8 fL (ref 78.0–100.0)
Platelets: 478 10*3/uL — ABNORMAL HIGH (ref 150–400)
RBC: 3.62 MIL/uL — ABNORMAL LOW (ref 3.87–5.11)
RDW: 17.6 % — ABNORMAL HIGH (ref 11.5–15.5)
WBC: 16.9 10*3/uL — ABNORMAL HIGH (ref 4.0–10.5)

## 2015-11-30 LAB — BASIC METABOLIC PANEL
Anion gap: 5 (ref 5–15)
BUN: 17 mg/dL (ref 6–20)
CO2: 25 mmol/L (ref 22–32)
Calcium: 9.2 mg/dL (ref 8.9–10.3)
Chloride: 111 mmol/L (ref 101–111)
Creatinine, Ser: 0.99 mg/dL (ref 0.44–1.00)
GFR calc Af Amer: >60 mL/min (ref >60)
GFR calc non Af Amer: >60 mL/min (ref >60)
Glucose, Bld: 91 mg/dL (ref 65–99)
Potassium: 3.9 mmol/L (ref 3.5–5.1)
Sodium: 141 mmol/L (ref 135–145)

## 2015-11-30 LAB — GLUCOSE, CAPILLARY
Glucose-Capillary: 83 mg/dL (ref 65–99)
Glucose-Capillary: 98 mg/dL (ref 65–99)
Glucose-Capillary: 96 mg/dL (ref 65–99)
Glucose-Capillary: 98 mg/dL (ref 65–99)

## 2015-11-30 NOTE — Progress Notes (Signed)
Family Medicine Teaching Service Daily Progress Note Intern Pager: [phone]  Patient name: Tiffany [name] Medical record number: [id number] Date of birth: 24-Feb-1960 Age: 55 y.o. Gender: female  Primary Care Provider: Kathrine Cords, MD  Consultants: Psychiatry  Code Status: FULL   Pt Overview and Major Events to Date:   Assessment and Plan:  #COPD exac.  Improving. Noted to be wheezy on exam 11/10.  Was given Prednisone in ED when initially presented but was not continued on admission.  -Continue Prednisone 50 mg daily  -Albuterol prn  #Chest pain.  Resolved.  -Troponins 0.06>0.03>0.03   #Crack cocaine use.  Patient states she would like to seek inpatient treatment as she has tried outpatient and failed.  -will contact SW and psychiatrist to look into inpatient vs outpatient substance abuse programs  #Electrolyte abnormalities.  Improving.  Lexapro may be contributing to hyponatremia, will discontinue for now.   Random urine Na+ 41.  Urine osmolality 208.   Likely renal losses (adrenal vs diuretic induced vs cerebral salt wasting).  Diuretic induced unlikely given she is on Lasix, not Thiazide.  Cerebral salt wasting also less likely as no known history of head trauma.   -Na+ 126>140>141 this AM.  K+ 2.9>4.1>4.5>3.6 this AM. Magnesium low at 1.4>1.9.  -Check Mag this AM -Gave Kdur 40 mEq x 2  -Check AM BMET -Replete electrolytes as needed  #Homocidal ideation/Suicidal ideation.  Continues to endorse both.  Has a sitter bedside.  Does not have a specific plan. -Psychiatric c/s, appreciate recs --> recommend inpatient treatment, add Prozac and Ambien, discontinue Remeron.  Dispo pending inpatient bed becomes available.  Patient medically stable for discharge to inpatient psych.  -PT- no needs identified -OT to see  #AKI.  Resolved.  Creatinine this AM 165>0.97>1.02>1.11, from baseline 0.90. -continue to monitor  #Bipolar 1 disorder/Depression.  On half dose of home  medications.  With new symptoms of tongue movements/lip smacking.  Will discuss with psych, appreciate recommendations.  Discontinue Lexapro as could be contributing to hyponatremia.  -Discontinue Remeron (per Psych recs) due to CHF, causes fluid retention -Continue Geodon 80 mg daily  -Start Prozac 10 mg daily for depression -Start Ambien 5 mg qhs as needed for sleep  #Prolonged QTc.  This AM 499.  Continue to avoid medications that prolong QTc. - Bipolar medication dosages have been decreased to half home dose.  -Continue to monitor   #CHF.  Stable.  BNP 68 on admission.  With no significant LE edema on exam. Recent admission in August 2017 with Echo showing EF 55-60%, G1DD.  At home on Lasix 40 mg daily.  -Hold home Lasix for now as she is euvolemic -continue to monitor   #T2DM.  Hgb A1c on 06/01/15 of 5.6.  At home on Metformin 500 mg BID.  CBGs have been stable.  -Moderate SSI  -Monitor CBGs  -Neurontin 600 mg TID for DM neuropathy   #HTN.  BP on admission 141/88.  At home on Norvasc 5 mg bid, Verapamil 240 mg BID and Lisinopril 40 mg daily.   Is on 2 CCBs, will hold Verapamil for now -Continue home Norvasc 10 mg daily -monitor blood pressures  #STD testing . Resolved.  Patient requested in ED as she has recent history of unprotected sex about 3 months ago.  Was given one dose of AZT and CTX in ED.  -GC/Chlamydia, HIV, RPR all negative.    FEN/GI: heart healthy/carb modified PPx: Lovenox  Disposition: Patient medically stable for discharge to inpatient psych.  Awaiting psych bed.   Subjective:  Patient states she is feeling homicidal and suicidal.  Continues to hear voices.   Objective: Temp:  [98.2 F (36.8 C)-98.5 F (36.9 C)] 98.2 F (36.8 C) (11/14 1115) Pulse Rate:  [63-78] 70 (11/14 1115) Resp:  [20] 20 (11/14 1115) BP: (142-154)/(77-99) 154/99 (11/14 1115) SpO2:  [95 %-100 %] 100 % (11/14 1115) Weight:  [238 lb 15.7 oz (108.4 kg)] 238 lb 15.7 oz (108.4 kg)  (11/14 0600) Physical Exam: General: obese, laying in bed in NAD, repetitive tongue and lip smacking movements, on [place].  With sitter at bedside. Cardiovascular: RRR, no MRG Respiratory: Mild wheezing on exam, comfortable work of breathing via [place] Abdomen: obese, soft, NT, ND +bs Extremities: no edema or tenderness Psych: still endorsing SI and HI, still hearing voices  Laboratory:  Recent Labs Lab 11/28/15 0322 11/29/15 0248 11/30/15 0401  WBC 16.7* 16.3* 16.9*  HGB 10.3* 10.1* 9.7*  HCT 33.1* 32.8* 31.8*  PLT 477* 476* 478*    Recent Labs Lab 11/24/15 2116  11/28/15 0322 11/29/15 0248 11/30/15 0401  NA 126*  < > 140 141 141  K 2.9*  < > 3.7 3.6 3.9  CL 92*  < > 108 109 111  CO2 23  < > 26 26 25   BUN 12  < > 12 20 17   CREATININE 1.65*  < > 1.08* 1.11* 0.99  CALCIUM 8.8*  < > 9.4 9.2 9.2  PROT 6.5  --   --   --   --   BILITOT 0.9  --   --   --   --   ALKPHOS 122  --   --   --   --   ALT 12*  --   --   --   --   AST 18  --   --   --   --   GLUCOSE 95  < > 88 114* 91  < > = values in this interval not displayed.  Imaging/Diagnostic Tests: No results found. Lovenia Kim, MD 11/30/2015, 1:05 PM PGY-1, [place] Intern pager: [phone], text pages welcome

## 2015-11-30 NOTE — Progress Notes (Signed)
Patient stating that new psych medication is causing her legs to swell and her blood pressure to go up.  She states that she is not going to take the new one anymore and they need to change her back to her old medication.  Dr. Reesa Chew paged and made aware.  Will continue to monitor.

## 2015-11-30 NOTE — Progress Notes (Signed)
Second attemt to place pt on cpap tonight. Ask pt to call this RT when she was ready for bed.

## 2015-12-01 ENCOUNTER — Encounter: Payer: Self-pay | Admitting: Psychiatry

## 2015-12-01 ENCOUNTER — Inpatient Hospital Stay
Admit: 2015-12-01 | Discharge: 2015-12-06 | DRG: 885 | Disposition: A | Discharge status: Home / Self Care | Payer: Commercial Managed Care - HMO | Attending: Psychiatry | Admitting: Psychiatry

## 2015-12-01 ENCOUNTER — Other Ambulatory Visit: Payer: Self-pay | Admitting: Family Medicine

## 2015-12-01 DIAGNOSIS — K219 Gastro-esophageal reflux disease without esophagitis: Secondary | ICD-10-CM | POA: Diagnosis present

## 2015-12-01 DIAGNOSIS — R45850 Homicidal ideations: Secondary | ICD-10-CM | POA: Diagnosis present

## 2015-12-01 DIAGNOSIS — I110 Hypertensive heart disease with heart failure: Secondary | ICD-10-CM | POA: Diagnosis present

## 2015-12-01 DIAGNOSIS — G4700 Insomnia, unspecified: Secondary | ICD-10-CM | POA: Diagnosis present

## 2015-12-01 DIAGNOSIS — F429 Obsessive-compulsive disorder, unspecified: Secondary | ICD-10-CM | POA: Diagnosis present

## 2015-12-01 DIAGNOSIS — E785 Hyperlipidemia, unspecified: Secondary | ICD-10-CM | POA: Diagnosis present

## 2015-12-01 DIAGNOSIS — Z8673 Personal history of transient ischemic attack (TIA), and cerebral infarction without residual deficits: Secondary | ICD-10-CM

## 2015-12-01 DIAGNOSIS — Z7982 Long term (current) use of aspirin: Secondary | ICD-10-CM

## 2015-12-01 DIAGNOSIS — E7800 Pure hypercholesterolemia, unspecified: Secondary | ICD-10-CM | POA: Diagnosis present

## 2015-12-01 DIAGNOSIS — E119 Type 2 diabetes mellitus without complications: Secondary | ICD-10-CM

## 2015-12-01 DIAGNOSIS — Z818 Family history of other mental and behavioral disorders: Secondary | ICD-10-CM

## 2015-12-01 DIAGNOSIS — F3113 Bipolar disorder, current episode manic without psychotic features, severe: Secondary | ICD-10-CM | POA: Diagnosis not present

## 2015-12-01 DIAGNOSIS — E8881 Metabolic syndrome: Secondary | ICD-10-CM | POA: Diagnosis present

## 2015-12-01 DIAGNOSIS — R45851 Suicidal ideations: Secondary | ICD-10-CM | POA: Diagnosis present

## 2015-12-01 DIAGNOSIS — F17200 Nicotine dependence, unspecified, uncomplicated: Secondary | ICD-10-CM | POA: Diagnosis present

## 2015-12-01 DIAGNOSIS — I4581 Long QT syndrome: Secondary | ICD-10-CM | POA: Diagnosis present

## 2015-12-01 DIAGNOSIS — Z8249 Family history of ischemic heart disease and other diseases of the circulatory system: Secondary | ICD-10-CM | POA: Diagnosis not present

## 2015-12-01 DIAGNOSIS — Z794 Long term (current) use of insulin: Secondary | ICD-10-CM

## 2015-12-01 DIAGNOSIS — E1140 Type 2 diabetes mellitus with diabetic neuropathy, unspecified: Secondary | ICD-10-CM | POA: Diagnosis present

## 2015-12-01 DIAGNOSIS — I252 Old myocardial infarction: Secondary | ICD-10-CM | POA: Diagnosis not present

## 2015-12-01 DIAGNOSIS — F1911 Other psychoactive substance abuse, in remission: Secondary | ICD-10-CM | POA: Diagnosis present

## 2015-12-01 DIAGNOSIS — I509 Heart failure, unspecified: Secondary | ICD-10-CM | POA: Diagnosis present

## 2015-12-01 DIAGNOSIS — Z01810 Encounter for preprocedural cardiovascular examination: Secondary | ICD-10-CM | POA: Diagnosis not present

## 2015-12-01 DIAGNOSIS — F17210 Nicotine dependence, cigarettes, uncomplicated: Secondary | ICD-10-CM | POA: Diagnosis present

## 2015-12-01 DIAGNOSIS — F1420 Cocaine dependence, uncomplicated: Secondary | ICD-10-CM | POA: Diagnosis present

## 2015-12-01 DIAGNOSIS — F3164 Bipolar disorder, current episode mixed, severe, with psychotic features: Principal | ICD-10-CM | POA: Diagnosis present

## 2015-12-01 DIAGNOSIS — Z79899 Other long term (current) drug therapy: Secondary | ICD-10-CM | POA: Diagnosis not present

## 2015-12-01 DIAGNOSIS — J449 Chronic obstructive pulmonary disease, unspecified: Secondary | ICD-10-CM | POA: Diagnosis present

## 2015-12-01 DIAGNOSIS — Z7951 Long term (current) use of inhaled steroids: Secondary | ICD-10-CM

## 2015-12-01 DIAGNOSIS — F200 Paranoid schizophrenia: Secondary | ICD-10-CM

## 2015-12-01 LAB — GLUCOSE, CAPILLARY
Glucose-Capillary: 80 mg/dL (ref 65–99)
Glucose-Capillary: 87 mg/dL (ref 65–99)

## 2015-12-01 MED ORDER — INSULIN ASPART 100 UNIT/ML ~~LOC~~ SOLN: 0.0000 [IU] | Freq: Every day | SUBCUTANEOUS | Status: DC

## 2015-12-01 MED ORDER — INSULIN ASPART 100 UNIT/ML ~~LOC~~ SOLN: 0.0000 [IU] | Freq: Three times a day (TID) | SUBCUTANEOUS | Status: DC

## 2015-12-01 MED ORDER — PANTOPRAZOLE SODIUM 40 MG PO TBEC
40.0000 mg | DELAYED_RELEASE_TABLET | Freq: Every day | ORAL | Status: DC
  Administered 2015-12-02 – 2015-12-06 (×5): 40 mg via ORAL
  Filled 2015-12-01 (×6): qty 1

## 2015-12-01 MED ORDER — ZOLPIDEM TARTRATE 5 MG PO TABS
5.0000 mg | ORAL_TABLET | Freq: Every evening | ORAL | Status: DC | PRN
  Administered 2015-12-01: 5 mg via ORAL
  Filled 2015-12-01: qty 1

## 2015-12-01 MED ORDER — ACETAMINOPHEN 325 MG PO TABS
650.0000 mg | ORAL_TABLET | Freq: Four times a day (QID) | ORAL | Status: DC | PRN
  Administered 2015-12-02: 650 mg via ORAL
  Filled 2015-12-01: qty 2

## 2015-12-01 MED ORDER — ZIPRASIDONE HCL 40 MG PO CAPS
80.0000 mg | ORAL_CAPSULE | Freq: Every day | ORAL | Status: DC
  Administered 2015-12-02 – 2015-12-05 (×4): 80 mg via ORAL
  Filled 2015-12-01 (×6): qty 2

## 2015-12-01 MED ORDER — ALBUTEROL SULFATE HFA 108 (90 BASE) MCG/ACT IN AERS
1.0000 | INHALATION_SPRAY | Freq: Four times a day (QID) | RESPIRATORY_TRACT | Status: DC | PRN
  Administered 2015-12-04 – 2015-12-06 (×3): 2 via RESPIRATORY_TRACT
  Filled 2015-12-01: qty 6.7

## 2015-12-01 MED ORDER — NICOTINE 21 MG/24HR TD PT24
21.0000 mg | MEDICATED_PATCH | Freq: Every day | TRANSDERMAL | Status: DC
  Administered 2015-12-03 – 2015-12-06 (×4): 21 mg via TRANSDERMAL
  Filled 2015-12-01 (×5): qty 1

## 2015-12-01 MED ORDER — ESCITALOPRAM OXALATE 10 MG PO TABS
20.0000 mg | ORAL_TABLET | Freq: Every day | ORAL | Status: DC
  Administered 2015-12-02 – 2015-12-06 (×5): 20 mg via ORAL
  Filled 2015-12-01 (×6): qty 2

## 2015-12-01 MED ORDER — METFORMIN HCL ER 500 MG PO TB24: 500.0000 mg | ORAL_TABLET | Freq: Every day | ORAL | Status: DC

## 2015-12-01 MED ORDER — MAGNESIUM HYDROXIDE 400 MG/5ML PO SUSP: 30.0000 mL | Freq: Every day | ORAL | Status: DC | PRN

## 2015-12-01 MED ORDER — TIOTROPIUM BROMIDE MONOHYDRATE 18 MCG IN CAPS
18.0000 ug | ORAL_CAPSULE | Freq: Every day | RESPIRATORY_TRACT | Status: DC
  Administered 2015-12-02 – 2015-12-06 (×5): 18 ug via RESPIRATORY_TRACT
  Filled 2015-12-01: qty 5

## 2015-12-01 MED ORDER — AMLODIPINE BESYLATE 5 MG PO TABS
5.0000 mg | ORAL_TABLET | Freq: Every day | ORAL | Status: DC
  Administered 2015-12-02 – 2015-12-06 (×5): 5 mg via ORAL
  Filled 2015-12-01 (×6): qty 1

## 2015-12-01 MED ORDER — FUROSEMIDE 40 MG PO TABS
40.0000 mg | ORAL_TABLET | Freq: Every day | ORAL | Status: DC
  Administered 2015-12-01: 40 mg via ORAL
  Filled 2015-12-01: qty 1

## 2015-12-01 MED ORDER — GABAPENTIN 300 MG PO CAPS
600.0000 mg | ORAL_CAPSULE | Freq: Every day | ORAL | Status: DC
  Administered 2015-12-01 – 2015-12-05 (×5): 600 mg via ORAL
  Filled 2015-12-01 (×5): qty 2

## 2015-12-01 MED ORDER — LISINOPRIL 10 MG PO TABS
40.0000 mg | ORAL_TABLET | Freq: Every day | ORAL | Status: DC
  Administered 2015-12-02 – 2015-12-06 (×5): 40 mg via ORAL
  Filled 2015-12-01 (×5): qty 4

## 2015-12-01 MED ORDER — ZIPRASIDONE HCL 80 MG PO CAPS: 80.0000 mg | ORAL_CAPSULE | Freq: Every day | ORAL | 0 refills | Status: DC

## 2015-12-01 MED ORDER — FUROSEMIDE 40 MG PO TABS
40.0000 mg | ORAL_TABLET | Freq: Every day | ORAL | Status: DC
  Administered 2015-12-02 – 2015-12-06 (×5): 40 mg via ORAL
  Filled 2015-12-01 (×5): qty 1

## 2015-12-01 MED ORDER — METFORMIN HCL 500 MG PO TABS
500.0000 mg | ORAL_TABLET | Freq: Two times a day (BID) | ORAL | Status: DC
  Administered 2015-12-02 – 2015-12-06 (×9): 500 mg via ORAL
  Filled 2015-12-01 (×9): qty 1

## 2015-12-01 MED ORDER — VERAPAMIL HCL ER 240 MG PO TBCR
480.0000 mg | EXTENDED_RELEASE_TABLET | Freq: Every day | ORAL | Status: DC
  Administered 2015-12-02 – 2015-12-06 (×5): 480 mg via ORAL
  Filled 2015-12-01 (×5): qty 2

## 2015-12-01 MED ORDER — ZOLPIDEM TARTRATE 5 MG PO TABS: 5.0000 mg | ORAL_TABLET | Freq: Every evening | ORAL | 0 refills | Status: DC | PRN

## 2015-12-01 MED ORDER — ASPIRIN 81 MG PO CHEW
81.0000 mg | CHEWABLE_TABLET | Freq: Every day | ORAL | Status: DC
  Administered 2015-12-02 – 2015-12-06 (×5): 81 mg via ORAL
  Filled 2015-12-01 (×7): qty 1

## 2015-12-01 MED ORDER — ALUM & MAG HYDROXIDE-SIMETH 200-200-20 MG/5ML PO SUSP: 30.0000 mL | ORAL | Status: DC | PRN

## 2015-12-01 MED ORDER — FLUTICASONE FUROATE-VILANTEROL 100-25 MCG/INH IN AEPB
1.0000 | INHALATION_SPRAY | Freq: Every day | RESPIRATORY_TRACT | Status: DC
  Administered 2015-12-02 – 2015-12-06 (×5): 1 via RESPIRATORY_TRACT
  Filled 2015-12-01: qty 28

## 2015-12-01 MED ORDER — ATORVASTATIN CALCIUM 20 MG PO TABS
40.0000 mg | ORAL_TABLET | Freq: Every day | ORAL | Status: DC
  Administered 2015-12-02 – 2015-12-05 (×4): 40 mg via ORAL
  Filled 2015-12-01 (×4): qty 2

## 2015-12-01 MED ORDER — POTASSIUM CHLORIDE CRYS ER 10 MEQ PO TBCR
10.0000 meq | EXTENDED_RELEASE_TABLET | Freq: Every day | ORAL | Status: DC
  Administered 2015-12-02 – 2015-12-06 (×5): 10 meq via ORAL
  Filled 2015-12-01 (×5): qty 1

## 2015-12-01 NOTE — Tx Team (Signed)
Initial Treatment Plan 12/01/2015 11:07 PM Tiffany [name] HJS:[id number]    PATIENT STRESSORS: Health problems Substance abuse   PATIENT STRENGTHS: Ability for insight Average or above average intelligence Capable of independent living Child psychotherapist Motivation for treatment/growth Supportive family/friends   PATIENT IDENTIFIED PROBLEMS: "Crack" "I smoke crack. That is my only problem that I need to work on."                     DISCHARGE CRITERIA:  Improved stabilization in mood, thinking, and/or behavior Motivation to continue treatment in a less acute level of care Need for constant or close observation no longer present Reduction of life-threatening or endangering symptoms to within safe limits Safe-care adequate arrangements made Verbal commitment to aftercare and medication compliance Withdrawal symptoms are absent or subacute and managed without 24-hour nursing intervention  PRELIMINARY DISCHARGE PLAN: Attend aftercare/continuing care group Attend 12-step recovery group Outpatient therapy Return to previous living arrangement  PATIENT/FAMILY INVOLVEMENT: This treatment plan has been presented to and reviewed with the patient, Tiffany [name], and/or family member. The patient and family have been given the opportunity to ask questions and make suggestions.  [place] Crane, RN 12/01/2015, 11:07 PM

## 2015-12-01 NOTE — Progress Notes (Addendum)
Pt for involuntary commitment to [facility].  Pt with questions SW and Dr. Lovenia Kim paged.  On coming nurse made aware.  Karie Kirks, Therapist, sports.

## 2015-12-01 NOTE — Progress Notes (Addendum)
Pt admitted from [facility] to [facility]. Accompanied by staff. Alert and oriented x 4, respirations even and unlabored, gait steady and unassisted, no acute distress noted. Denies having any pain or discomfort at this time. Denies SI/HI/AVH, anxiety and depression but reported that she was angry stating "i'm mad because I have to be here. I want to go to treatment because I'm addicted to crack." Skin assessment done. Also reports having auditory hallucinations when high on crack stating "when I'm high, I hear voices and things." Pt has a small closed wound on her right outer thigh. Pt stated "I got that from dropping my crack pipe." Wound is healed, scab has fallen off. Wound has brown outer edge with pink in the middle. No other issues noted. Pt and belongings were searched for contraband. No contraband found. Is on q15 minute observation rounds for safety. Will continue to monitor.

## 2015-12-01 NOTE — Clinical Social Work Psych Note (Signed)
Psych [name] received consult re: inpatient psychiatric hospitalization.  Of note, psychiatry last evaluated patient on 11/12/20107.  Psych [name] has sent referrals to local facilities.  Patient is currently under review at [facility].  Patient was denied by [facility] and [facility] will continue to seek inpatient placement.  Per medical staff, patient is medically stable and ready for transfer once bed is available.  Disposition: Inpatient Psychiatric Hospitalization  Nonnie Done, MSW, LCSW  [phone]  Licensed Clinical Social Worker

## 2015-12-01 NOTE — Discharge Summary (Signed)
[facility] Discharge Summary  Patient name: Tiffany [name] Medical record number: [id number] Date of birth: Oct 17, 1960 Age: 55 y.o. Gender: female Date of Admission: 11/24/2015  Date of Discharge: 12/01/2015 Admitting Physician: Dickie La, MD  Primary Care Provider: Kathrine Cords, MD Consultants: Psychiatry  Indication for Hospitalization: COPD  Discharge Diagnoses/Problem List:  Specialty Problems    None      Disposition: Inpatient psychiatric facility   Discharge Condition: Medically stable for psychiatric placement   Discharge Exam:  Temp:  [98.1 F (36.7 C)-98.6 F (37 C)] 98.6 F (37 C) (11/13 0553) Pulse Rate:  [68-79] 68 (11/13 0553) Resp:  [18-20] 18 (11/13 0553) BP: (139-141)/(67-88) 141/88 (11/13 0553) SpO2:  [94 %-98 %] 96 % (11/13 0553) Weight:  [236 lb (107 kg)] 236 lb (107 kg) (11/13 0553) Physical Exam: General: obese, laying in bed in NAD, repetitive tongue and lip smacking movements, on [place].  With sitter at bedside. Cardiovascular: RRR, no MRG Respiratory: Mild wheezing on exam, comfortable work of breathing via [place] Abdomen: obese, soft, NT, ND +bs Extremities: no edema or tenderness Psych: still endorsing SI and HI, still hearing voices  Brief Hospital Course:  Admitted for chest pain and history of crack-cocaine use, electrolyte abnormalities.   55yoF p/w electrolyte abnormalities and dehydration on intial presentation to ED with chest pain that had resolved. Likely induced by cocaine use. CXR negative for active cardiopulmonary disease. EKG normal sinus rhythm with prolonged Qtc. No ST-T elevations or T wave abnormalities. Initial troponin 0.06, BNP 68. UDS postive for cocaine and marijuana. Patient admitted to med-surg. Upon admission to floor patient endorsing suicidal ideation and homicidal ideation.  Labs notable for hypokalemia to 2.9 which was subsequently repleted and is now stable at 3.9 on the day of discharge.   Also mild hyponatremia to 126 which subsequently resolved with IV NS and is stable at 141 on the day of discharge.   Throughout hospital course patient continues to endorse auditory hallucinations and SI/HI.  She was monitored with a sitter in the hospital.  She was evaluated by psychiatry and in-patient psychiatric placement was recommended. Patient considered a danger to herself and others.  She received Remeron, Geodon, and Lexapro (home medications) for mental health issues in the hospital.  Patient was medically stable in the hospital for multiple days while awaiting inpatient psychiatric placement.  Issues for Follow Up:  1. Prolonged Qtc (499) - during admission medication doses were halved.  Psych recommended adding prozac 10 mg daily which patient refused. 2. Remeron was discontinued as this may contribute to LE edema.  Significant Procedures:   Significant Labs and Imaging:   Recent Labs Lab 11/28/15 0322 11/29/15 0248 11/30/15 0401  WBC 16.7* 16.3* 16.9*  HGB 10.3* 10.1* 9.7*  HCT 33.1* 32.8* 31.8*  PLT 477* 476* 478*    Recent Labs Lab 11/24/15 2116 11/25/15 0159  11/25/15 1405 11/26/15 0359 11/27/15 0533 11/28/15 0322 11/29/15 0248 11/29/15 0725 11/30/15 0401  NA 126*  --   < > 134* 140 140 140 141  --  141  K 2.9*  --   < > 4.1 4.1 4.5 3.7 3.6  --  3.9  CL 92*  --   < > 102 108 108 108 109  --  111  CO2 23  --   < > '22 26 24 26 26  ' --  25  GLUCOSE 95  --   < > 108* 76 117* 88 114*  --  91  BUN 12  --   < > '8 8 8 12 20  ' --  17  CREATININE 1.65* 1.44*  < > 1.07* 0.97 1.02* 1.08* 1.11*  --  0.99  CALCIUM 8.8*  --   < > 9.1 8.8* 9.6 9.4 9.2  --  9.2  MG  --  1.4*  --  2.2  --  1.9  --   --  1.8  --   ALKPHOS 122  --   --   --   --   --   --   --   --   --   AST 18  --   --   --   --   --   --   --   --   --   ALT 12*  --   --   --   --   --   --   --   --   --   ALBUMIN 3.4*  --   --   --   --   --   --   --   --   --   < > = values in this interval not  displayed.  Results/Tests Pending at Time of Discharge: No  Discharge Medications:    Medication List    STOP taking these medications   escitalopram 20 MG tablet Commonly known as:  LEXAPRO   mirtazapine 30 MG tablet Commonly known as:  REMERON   SYMBICORT 160-4.5 MCG/ACT inhaler Generic drug:  budesonide-formoterol     TAKE these medications   ACCU-CHEK AVIVA PLUS w/Device Kit To check CBGs three times daily   ACCU-CHEK SOFTCLIX LANCETS lancets   albuterol 108 (90 Base) MCG/ACT inhaler Commonly known as:  PROVENTIL HFA;VENTOLIN HFA Inhale 2 puffs into the lungs every 6 (six) hours as needed. What changed:  Another medication with the same name was removed. Continue taking this medication, and follow the directions you see here.   amLODipine 5 MG tablet Commonly known as:  NORVASC Take 2 tablets (10 mg total) by mouth daily.   aspirin 81 MG EC tablet Take 1 tablet (81 mg total) by mouth daily. Swallow whole.   atorvastatin 40 MG tablet Commonly known as:  LIPITOR Take 1 tablet (40 mg total) by mouth daily.   fluticasone furoate-vilanterol 100-25 MCG/INH Aepb Commonly known as:  BREO ELLIPTA INHALE 1 PUFF EVERY DAY   furosemide 40 MG tablet Commonly known as:  LASIX Take 1 tablet (40 mg total) by mouth daily. Take Lasix twice daily from 7/14 until 7/18, then daily   gabapentin 600 MG tablet Commonly known as:  NEURONTIN Take 1 tablet (600 mg total) by mouth 3 (three) times daily.   glucose blood test strip Use to check blood sugar 3 times daily.   lisinopril 40 MG tablet Commonly known as:  PRINIVIL,ZESTRIL Take 1 tablet (40 mg total) by mouth daily.   loratadine 10 MG tablet Commonly known as:  CLARITIN Take 1 tablet (10 mg total) by mouth daily.   metFORMIN 500 MG 24 hr tablet Commonly known as:  GLUCOPHAGE-XR Take 1 tablet (500 mg total) by mouth 2 (two) times daily.   montelukast 10 MG tablet Commonly known as:  SINGULAIR Take 1 tablet (10 mg  total) by mouth at bedtime.   multivitamin with minerals tablet Take 1 tablet by mouth daily. Reported on 07/28/2015   naproxen 500 MG tablet Commonly known as:  NAPROSYN Take 1 tablet (500 mg total) by mouth  3 (three) times daily as needed. What changed:  reasons to take this   omeprazole 40 MG capsule Commonly known as:  PRILOSEC Take 1 capsule (40 mg total) by mouth daily.   oxybutynin 5 MG tablet Commonly known as:  DITROPAN Take 1 tablet (5 mg total) by mouth 3 (three) times daily.   potassium chloride 10 MEQ tablet Commonly known as:  K-DUR,KLOR-CON Take 2 tablets (20 mEq total) by mouth daily. What changed:  how much to take  when to take this   tiotropium 18 MCG inhalation capsule Commonly known as:  SPIRIVA HANDIHALER Place 1 capsule (18 mcg total) into inhaler and inhale daily.   verapamil 240 MG 24 hr capsule Commonly known as:  VERELAN PM Take 2 capsules (480 mg total) by mouth at bedtime.   ziprasidone 80 MG capsule Commonly known as:  GEODON Take 1 capsule (80 mg total) by mouth at bedtime. What changed:  how much to take   zolpidem 5 MG tablet Commonly known as:  AMBIEN Take 1 tablet (5 mg total) by mouth at bedtime as needed for sleep.       Discharge Instructions: Please refer to Patient Instructions section of EMR for full details.  Patient was counseled important signs and symptoms that should prompt return to medical care, changes in medications, dietary instructions, activity restrictions, and follow up appointments.   Follow-Up Appointments:   Everrett Coombe, MD 12/01/2015, 8:18 PM PGY-1, [place]

## 2015-12-01 NOTE — Progress Notes (Signed)
On change of shift, patient had refused to sign consent for [facility] @ [place] This writer spoke to patient about the importance of behavioral rehab prior to drug rehab that she was demanding immediately. Patient then agreed to sign consent for [facility]. S/W and MD called for the above compliance.  Patient's son ([name]) picked up her cell phone. The rest of her belonging was sent with her along with the sitter to [facility]. Upon D/C, patient was pleasant & compliant.  Report was called to [place] @ [facility] for bed # 316 B
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
## 2015-12-01 NOTE — BH Assessment (Signed)
Patient has been accepted to [facility].  Accepting physician is Dr. Bary Leriche.  Attending Physician will be Dr. Bary Leriche.  Patient has been assigned to room 316-B, by [place].  Call report to [phone].  Representative/Transfer Coordinator is Clois Montavon Patient pre-admitted by [facility] Patient Access Ramond Craver)  [facility] Staff Leveda Anna, Social Worker-[phone]) made aware of acceptance.

## 2015-12-01 NOTE — Progress Notes (Signed)
[name] received call from [place] at [facility] confirming that pt had been accepted there this pm.  [name] informed pt of her acceptance and she refused to go because her family would not have means to visit her.  Explained to pt that she was medically ready for tx and would need to accept the first available bed.  [name] further explained that either she could accept the bed and go voluntarily or she would be IVC'ed and transported by Event organiser.  Pt continued to refuse, stating that she wanted her MD to be called.  RN called MD who spoke with pt re: tx.  Pt continued to refuse tx, and MD agreeable to complete IVC paperwork.   Pt eventually agreed to tx, signed voluntary paperwork and transport was arranged via [employer].    Voluntary form signed and faxed.  RN to call report.  Creta Levin, LCSW [facility] [phone]

## 2015-12-01 NOTE — Plan of Care (Signed)
Problem: Safety: Goal: Ability to remain free from injury will improve Outcome: Progressing Pt will remain free form injury.  Problem: Health Behavior/Discharge Planning: Goal: Ability to manage health-related needs will improve Outcome: Progressing Pts ability to manage health-related needs will improve.  Problem: Nutrition: Goal: Adequate nutrition will be maintained Outcome: Progressing Adequate nutrition will be maintained throughout stay.  Problem: Education: Goal: Ability to make informed decisions regarding treatment will improve Outcome: Progressing Pt will be able to make informed decisions regarding treatment.  Problem: Coping: Goal: Ability to cope will improve Outcome: Progressing Pt will attend groups and learn new ways coping mechanisms.  Problem: Safety: Goal: Ability to disclose and discuss suicidal ideas will improve Outcome: Progressing Pt will continue to notify staff if feelings of suicidal ideations return.

## 2015-12-02 DIAGNOSIS — F3164 Bipolar disorder, current episode mixed, severe, with psychotic features: Principal | ICD-10-CM

## 2015-12-02 LAB — GLUCOSE, CAPILLARY
Glucose-Capillary: 92 mg/dL (ref 65–99)
Glucose-Capillary: 93 mg/dL (ref 65–99)
Glucose-Capillary: 88 mg/dL (ref 65–99)
Glucose-Capillary: 82 mg/dL (ref 65–99)

## 2015-12-02 MED ORDER — AMANTADINE HCL 100 MG PO CAPS
100.0000 mg | ORAL_CAPSULE | Freq: Two times a day (BID) | ORAL | Status: DC
  Administered 2015-12-02 – 2015-12-06 (×9): 100 mg via ORAL
  Filled 2015-12-02 (×9): qty 1

## 2015-12-02 MED ORDER — MIRTAZAPINE 15 MG PO TABS
30.0000 mg | ORAL_TABLET | Freq: Every day | ORAL | Status: DC
  Administered 2015-12-02 – 2015-12-03 (×2): 30 mg via ORAL
  Filled 2015-12-02 (×2): qty 2
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
## 2015-12-02 NOTE — Tx Team (Signed)
Interdisciplinary Treatment and Diagnostic Plan Update  12/02/2015 Time of Session: 10:30am Tiffany [name] MRN: [id number]  Principal Diagnosis: <principal problem not specified>  Secondary Diagnoses: Active Problems:   Obesity   Type 2 diabetes mellitus (HCC)   Dyslipidemia   COPD (chronic obstructive pulmonary disease) (HCC)   Tobacco use disorder   Bipolar affective disorder, mixed, severe, with psychotic behavior ([place])   Cocaine use disorder, severe, dependence ([place])   Bipolar disorder with severe mania ([place])   Current Medications:  Current Facility-Administered Medications  Medication Dose Route Frequency Provider Last Rate Last Dose  . acetaminophen (TYLENOL) tablet 650 mg  650 mg Oral Q6H PRN Clovis Fredrickson, MD   650 mg at 12/02/15 0808  . albuterol (PROVENTIL HFA;VENTOLIN HFA) 108 (90 Base) MCG/ACT inhaler 1-2 puff  1-2 puff Inhalation Q6H PRN Jolanta B Pucilowska, MD      . alum & mag hydroxide-simeth (MAALOX/MYLANTA) 200-200-20 MG/5ML suspension 30 mL  30 mL Oral Q4H PRN Jolanta B Pucilowska, MD      . amantadine (SYMMETREL) capsule 100 mg  100 mg Oral BID Jolanta B Pucilowska, MD   100 mg at 12/02/15 0930  . amLODipine (NORVASC) tablet 5 mg  5 mg Oral Daily Clovis Fredrickson, MD   5 mg at 12/02/15 0659  . aspirin chewable tablet 81 mg  81 mg Oral Daily Clovis Fredrickson, MD   81 mg at 12/02/15 0807  . atorvastatin (LIPITOR) tablet 40 mg  40 mg Oral q1800 Jolanta B Pucilowska, MD      . escitalopram (LEXAPRO) tablet 20 mg  20 mg Oral Daily Jolanta B Pucilowska, MD   20 mg at 12/02/15 0807  . fluticasone furoate-vilanterol (BREO ELLIPTA) 100-25 MCG/INH 1 puff  1 puff Inhalation Daily Jolanta B Pucilowska, MD      . furosemide (LASIX) tablet 40 mg  40 mg Oral Daily Jolanta B Pucilowska, MD   40 mg at 12/02/15 0809  . gabapentin (NEURONTIN) capsule 600 mg  600 mg Oral QHS Clovis Fredrickson, MD   600 mg at 12/01/15 2224  . insulin aspart (novoLOG) injection 0-20  Units  0-20 Units Subcutaneous TID WC Jolanta B Pucilowska, MD      . insulin aspart (novoLOG) injection 0-5 Units  0-5 Units Subcutaneous QHS Jolanta B Pucilowska, MD      . lisinopril (PRINIVIL,ZESTRIL) tablet 40 mg  40 mg Oral Daily Clovis Fredrickson, MD   40 mg at 12/02/15 0807  . magnesium hydroxide (MILK OF MAGNESIA) suspension 30 mL  30 mL Oral Daily PRN Jolanta B Pucilowska, MD      . metFORMIN (GLUCOPHAGE) tablet 500 mg  500 mg Oral BID WC Jolanta B Pucilowska, MD   500 mg at 12/02/15 0807  . mirtazapine (REMERON) tablet 30 mg  30 mg Oral QHS Jolanta B Pucilowska, MD      . nicotine (NICODERM CQ - dosed in mg/24 hours) patch 21 mg  21 mg Transdermal Q0600 Jolanta B Pucilowska, MD      . pantoprazole (PROTONIX) EC tablet 40 mg  40 mg Oral Daily Jolanta B Pucilowska, MD   40 mg at 12/02/15 0700  . potassium chloride (K-DUR,KLOR-CON) CR tablet 10 mEq  10 mEq Oral Daily Clovis Fredrickson, MD   10 mEq at 12/02/15 0808  . tiotropium (SPIRIVA) inhalation capsule 18 mcg  18 mcg Inhalation Daily Jolanta B Pucilowska, MD      . verapamil (CALAN-SR) CR tablet 480 mg  480 mg  Oral Daily Clovis Fredrickson, MD   480 mg at 12/02/15 0808  . ziprasidone (GEODON) capsule 80 mg  80 mg Oral Q supper Clovis Fredrickson, MD       PTA Medications: Prescriptions Prior to Admission  Medication Sig Dispense Refill Last Dose  . ACCU-CHEK SOFTCLIX LANCETS lancets    Taking  . albuterol (PROVENTIL HFA;VENTOLIN HFA) 108 (90 Base) MCG/ACT inhaler Inhale 2 puffs into the lungs every 6 (six) hours as needed. 1 Inhaler 6   . amLODipine (NORVASC) 5 MG tablet Take 2 tablets (10 mg total) by mouth daily. 180 tablet 0   . aspirin 81 MG EC tablet Take 1 tablet (81 mg total) by mouth daily. Swallow whole. 90 tablet 0   . atorvastatin (LIPITOR) 40 MG tablet Take 1 tablet (40 mg total) by mouth daily. 90 tablet 4   . fluticasone furoate-vilanterol (BREO ELLIPTA) 100-25 MCG/INH AEPB INHALE 1 PUFF EVERY DAY 180 each 0    . furosemide (LASIX) 40 MG tablet Take 1 tablet (40 mg total) by mouth daily. Take Lasix twice daily from 7/14 until 7/18, then daily 30 tablet 0 Taking  . gabapentin (NEURONTIN) 600 MG tablet Take 1 tablet (600 mg total) by mouth 3 (three) times daily. 270 tablet 0   . glucose blood test strip Use to check blood sugar 3 times daily. 100 each 12 Taking  . lisinopril (PRINIVIL,ZESTRIL) 40 MG tablet Take 1 tablet (40 mg total) by mouth daily. 90 tablet 0   . loratadine (CLARITIN) 10 MG tablet Take 1 tablet (10 mg total) by mouth daily. 90 tablet 0 Taking  . metFORMIN (GLUCOPHAGE-XR) 500 MG 24 hr tablet Take 1 tablet (500 mg total) by mouth 2 (two) times daily. 180 tablet 0   . montelukast (SINGULAIR) 10 MG tablet Take 1 tablet (10 mg total) by mouth at bedtime. 90 tablet 3 Taking  . Multiple Vitamins-Minerals (MULTIVITAMIN WITH MINERALS) tablet Take 1 tablet by mouth daily. Reported on 07/28/2015   Taking  . naproxen (NAPROSYN) 500 MG tablet Take 1 tablet (500 mg total) by mouth 3 (three) times daily as needed. (Patient taking differently: Take 500 mg by mouth 3 (three) times daily as needed for mild pain. ) 270 tablet 0 Taking  . omeprazole (PRILOSEC) 40 MG capsule Take 1 capsule (40 mg total) by mouth daily. 90 capsule 1 Taking  . oxybutynin (DITROPAN) 5 MG tablet Take 1 tablet (5 mg total) by mouth 3 (three) times daily. 270 tablet 3 Taking  . potassium chloride (K-DUR,KLOR-CON) 10 MEQ tablet Take 2 tablets (20 mEq total) by mouth daily. (Patient taking differently: Take 10 mEq by mouth 2 (two) times daily. ) 180 tablet 2 Taking  . tiotropium (SPIRIVA HANDIHALER) 18 MCG inhalation capsule Place 1 capsule (18 mcg total) into inhaler and inhale daily. 90 capsule 4   . verapamil (VERELAN PM) 240 MG 24 hr capsule Take 2 capsules (480 mg total) by mouth at bedtime. 180 capsule 0   . ziprasidone (GEODON) 80 MG capsule Take 1 capsule (80 mg total) by mouth at bedtime. 90 capsule 0   . zolpidem (AMBIEN) 5  MG tablet Take 1 tablet (5 mg total) by mouth at bedtime as needed for sleep. 30 tablet 0     Patient Stressors: Health problems Substance abuse  Patient Strengths: Ability for insight Average or above average intelligence Capable of independent living Child psychotherapist Motivation for treatment/growth Supportive family/friends  Treatment Modalities: Medication Management, Group therapy, Case  management,  1 to 1 session with clinician, Psychoeducation, Recreational therapy.   Physician Treatment Plan for Primary Diagnosis: <principal problem not specified> Long Term Goal(s): Improvement in symptoms so as ready for discharge Improvement in symptoms so as ready for discharge   Short Term Goals: Ability to identify changes in lifestyle to reduce recurrence of condition will improve Ability to verbalize feelings will improve Ability to disclose and discuss suicidal ideas Ability to demonstrate self-control will improve Ability to identify and develop effective coping behaviors will improve Ability to maintain clinical measurements within normal limits will improve Compliance with prescribed medications will improve Ability to identify changes in lifestyle to reduce recurrence of condition will improve Ability to demonstrate self-control will improve Ability to identify triggers associated with substance abuse/mental health issues will improve  Medication Management: Evaluate patient's response, side effects, and tolerance of medication regimen.  Therapeutic Interventions: 1 to 1 sessions, Unit Group sessions and Medication administration.  Evaluation of Outcomes: Progressing  Physician Treatment Plan for Secondary Diagnosis: Active Problems:   Obesity   Type 2 diabetes mellitus (HCC)   Dyslipidemia   COPD (chronic obstructive pulmonary disease) (HCC)   Tobacco use disorder   Bipolar affective disorder, mixed, severe, with psychotic behavior ([place])   Cocaine  use disorder, severe, dependence ([place])   Bipolar disorder with severe mania ([place])  Long Term Goal(s): Improvement in symptoms so as ready for discharge Improvement in symptoms so as ready for discharge   Short Term Goals: Ability to identify changes in lifestyle to reduce recurrence of condition will improve Ability to verbalize feelings will improve Ability to disclose and discuss suicidal ideas Ability to demonstrate self-control will improve Ability to identify and develop effective coping behaviors will improve Ability to maintain clinical measurements within normal limits will improve Compliance with prescribed medications will improve Ability to identify changes in lifestyle to reduce recurrence of condition will improve Ability to demonstrate self-control will improve Ability to identify triggers associated with substance abuse/mental health issues will improve     Medication Management: Evaluate patient's response, side effects, and tolerance of medication regimen.  Therapeutic Interventions: 1 to 1 sessions, Unit Group sessions and Medication administration.  Evaluation of Outcomes: Progressing   RN Treatment Plan for Primary Diagnosis: <principal problem not specified> Long Term Goal(s): Knowledge of disease and therapeutic regimen to maintain health will improve  Short Term Goals: Ability to verbalize frustration and anger appropriately will improve, Ability to demonstrate self-control, Ability to verbalize feelings will improve, Ability to identify and develop effective coping behaviors will improve and Compliance with prescribed medications will improve  Medication Management: RN will administer medications as ordered by provider, will assess and evaluate patient's response and provide education to patient for prescribed medication. RN will report any adverse and/or side effects to prescribing provider.  Therapeutic Interventions: 1 on 1 counseling sessions,  Psychoeducation, Medication administration, Evaluate responses to treatment, Monitor vital signs and CBGs as ordered, Perform/monitor CIWA, COWS, AIMS and Fall Risk screenings as ordered, Perform wound care treatments as ordered.  Evaluation of Outcomes: Progressing   LCSW Treatment Plan for Primary Diagnosis: <principal problem not specified> Long Term Goal(s): Safe transition to appropriate next level of care at discharge, Engage patient in therapeutic group addressing interpersonal concerns.  Short Term Goals: Engage patient in aftercare planning with referrals and resources, Increase social support, Increase ability to appropriately verbalize feelings, Increase emotional regulation, Facilitate acceptance of mental health diagnosis and concerns and Increase skills for wellness and recovery  Therapeutic Interventions:  Assess for all discharge needs, 1 to 1 time with Social worker, Explore available resources and support systems, Assess for adequacy in community support network, Educate family and significant other(s) on suicide prevention, Complete Psychosocial Assessment, Interpersonal group therapy.  Evaluation of Outcomes: Progressing   Progress in Treatment: Attending groups: No. Participating in groups: No. Taking medication as prescribed: Yes. Toleration medication: Yes. Family/Significant other contact made: No, will contact:  [name] still assessing for appropriate referrals Patient understands diagnosis: Yes Discussing patient identified problems/goals with staff: Yes Medical problems stabilized or resolved: Yes Denies suicidal/homicidal ideation: No. Issues/concerns per patient self-inventory: No. Other: none  New problem(s) identified: No, Describe:  none reported  New Short Term/Long Term Goal(s):  Discharge Plan or Barriers:   Reason for Continuation of Hospitalization: Anxiety Delusions  Depression Hallucinations Homicidal ideation Suicidal ideation Withdrawal  symptoms  Estimated Length of Stay:  Attendees: Patient: Tiffany [name] 12/02/2015 10:56 AM  Physician: Dr. Bary Leriche, MD 12/02/2015 10:56 AM  Nursing: Polly Cobia, RN 12/02/2015 10:56 AM  RN Care Manager: 12/02/2015 10:56 AM  Social Worker: Alphonse Guild. Daine Gravel, LCAS 12/02/2015 10:56 AM  Recreational Therapist: Everitt Amber, LRT 12/02/2015 10:56 AM  Other:  12/02/2015 10:56 AM  Other:  12/02/2015 10:56 AM  Other: 12/02/2015 10:56 AM    Scribe for Treatment Team: Alphonse Guild Indie Boehne, LCSWA 12/02/2015

## 2015-12-02 NOTE — Plan of Care (Signed)
Problem: Physical Regulation: Goal: Ability to maintain clinical measurements within normal limits will improve Outcome: Not Progressing Patient having high blood pressure readings this am. BP medications given

## 2015-12-02 NOTE — Progress Notes (Signed)
Pt has removed CPAP mask and refuses to wear it now. Pt denies that the mask was uncomfortable or painful to wear stating, "no, I just don't need to wear it right now." Pt was educated on the importance of using the CPAP machine at night, while sleeping but still refused to put it back on. Pt was told to notify staff if she has any difficulty breathing, or changes her mind about not wearing the CPAP machine. . Pt is still being monitored with a camera that was placed in her to monitor her while using the CPAP machine. No further complaints. No acute distress noted. Remains on q15 minute observation checks for safety. Will continue to monitor.

## 2015-12-02 NOTE — H&P (Signed)
Psychiatric Admission Assessment Adult  [name] Identification: Tiffany [name] MRN:  [id number] Date of Evaluation:  12/02/2015 Chief Complaint:  depression Principal Diagnosis: <principal problem not specified> Diagnosis:   [name] Active Problem List   Diagnosis Date Noted  . Bipolar disorder with severe mania ([place]) [F31.13] 12/01/2015  . Bipolar affective disorder, mixed, severe, with psychotic behavior ([place]) [F31.64] 11/28/2015  . Cocaine use disorder, severe, dependence ([place]) [F14.20] 11/28/2015  . Hyponatremia [E87.1] 11/25/2015  . AKI (acute kidney injury) ([place]) [N17.9] 11/24/2015  . Blood in sputum [R04.2]   . Acute on chronic congestive heart failure ([place]) [I50.9]   . SOB (shortness of breath) [R06.02]   . Hypoxia [R09.02] 08/19/2015  . COPD with acute exacerbation ([place]) [J44.1] 08/19/2015  . Acute on chronic respiratory failure with hypoxia and hypercapnia (HCC) [J18.84, J96.22] 06/22/2015  . Acute respiratory failure with hypoxemia ([place]) [J96.01] 06/22/2015  . Scabies [B86] 06/01/2015  . Onychomycosis [B35.1] 01/21/2015  . Nasal congestion [R09.81] 12/17/2014  . Tobacco use disorder [F17.200] 07/22/2014  . COPD (chronic obstructive pulmonary disease) ([place]) [J44.9] 07/08/2014  . COPD exacerbation ([place]) [J44.1] 06/26/2014  . Seborrheic keratoses [L82.1] 12/31/2013  . Thrush [B37.0] 09/19/2013  . Knee pain, bilateral [M25.561, M25.562] 01/22/2013  . Seizure ([name]) [R56.9] 01/04/2013  . Health care maintenance [Z00.00] 11/25/2012  . Folliculitis [Z66.0] 63/01/6008  . History of kidney stones [Z87.442] 06/18/2012  . Chronic pain syndrome [G89.4] 06/18/2012  . Muscle spasm [M62.838] 06/18/2012  . Elevated troponin [R74.8] 04/28/2012  . Asthma with acute exacerbation [J45.901] 04/28/2012  . Allergic reaction [T78.40XA] 04/07/2012  . Itching [L29.9] 09/06/2011  . HSV infection [B00.9] 08/30/2011  . Dyslipidemia [E78.5] 04/24/2011  . Anemia [D64.9] 04/24/2011  . Diabetic  neuropathy ([place]) [E11.40] 04/24/2011  . Obstructive sleep apnea [G47.33] 10/18/2010  . Asthma [493] 10/18/2010  . Obesity [E66.9] 10/18/2010  . Type 2 diabetes mellitus ([place]) [E11.9] 10/18/2010   History of Present Illness:   Identifying data. Tiffany [name] is a 55 year old female with history of depression, mood instability, and cocaine dependence.  Chief complaint. "I have a crack addiction."  History of present illness. Information was obtained from Tiffany [name] and Tiffany chart. Tiffany [name] has a long history of depression, psychosis, and mood instability. She has been maintained on a combination of Celexa, Remeron, and Geodon with excellent results. While on medication she does not experience any symptoms of depression, anxiety, or psychosis. Over Tiffany past 3 years, she relapsed on crack for 2 or 3 days at that time, sleeping around for drugs, and spending bills money on cocaine. She has not been consistent in taking her prescribed medication while binging. She was on a major binge when she developed Auditory command hallucinations telling her to kill people, suicidal ideation, and paranoia. She was admitted to medical floor at [facility]. Due to hyponatremia and an elongated QT her psychiatric medications were adjusted. She only takes half a dose of Celexa and Geodon. Remeron was discontinued completely. Tiffany [name] was transferred to [facility] for further treatment and stabilization as she still voiced suicidal and homicidal ideation. Today on Tiffany interview Tiffany [name] denies symptoms of depression or psychosis. She denies symptoms suggestive of bipolar. She does report passing suicidal ideation especially if inpatient substance abuse treatment is not available and she is to return to Tiffany streets of [place]. She reports some symptoms of anxiety, especially when talking about her troubles. She noticed that her mouth would tremble and her hands would shake. She denies  panic  attacks, PTSD, or OCD symptoms. [name] a crack user but denies alcohol or other substance abuse.  Past psychiatric history. She has a long history of bipolar or schizoaffective disorder. There are several hospitalizations in Tiffany remote past. She does well on a combination of medicines. She denies ever attempting suicide. 30 years ago she went to [facility] for inpatient treatment for substance abuse and reports great success.  Family psychiatric history. Multiple family members with mental illness and addiction.  Social history. [name] disabled from mental illness and medical problems. She has a place. Her son, her cousin and Tiffany cousin's spouse live with her. Tiffany [name] however frequently leaves Tiffany apartment and states that Tiffany crack house when binging.  Total Time spent with [name]: 1 hour  Is Tiffany [name] at risk to self? Yes.    Has Tiffany [name] been a risk to self in Tiffany past 6 months? No.  Has Tiffany [name] been a risk to self within Tiffany distant past? No.  Is Tiffany [name] a risk to others? No.  Has Tiffany [name] been a risk to others in Tiffany past 6 months? No.  Has Tiffany [name] been a risk to others within Tiffany distant past? No.   Prior Inpatient Therapy:   Prior Outpatient Therapy:    Alcohol Screening: 1. How often do you have a drink containing alcohol?: Never 9. Have you or someone else been injured as a result of your drinking?: No 10. Has a relative or friend or a doctor or another health worker been concerned about your drinking or suggested you cut down?: No Alcohol Use Disorder Identification Test Final Score (AUDIT): 0 Brief Intervention: AUDIT score less than 7 or less-screening does not suggest unhealthy drinking-brief intervention not indicated Substance Abuse History in Tiffany last 12 months:  Yes.   Consequences of Substance Abuse: Negative Previous Psychotropic Medications: Yes  Psychological Evaluations: No  Past Medical History:  Past Medical History:  Diagnosis  Date  . Anxiety   . Asthma   . Cocaine abuse   . Complication of anesthesia    decreased bp, decreased heart rate  . Diabetes mellitus    class 2  . Disorder of nervous system   . Emphysema   . Heart attack   . High blood pressure   . High cholesterol   . Hypertension   . Incontinence   . Manic depression ([place])   . Myocardial infarct   . Pneumonia 06/2015  . Seasonal allergies   . Seizures ([place])   . Sinus trouble   . Sleep apnea   . Stroke [facility])     Past Surgical History:  Procedure Laterality Date  . ABDOMINAL HYSTERECTOMY    . CESAREAN SECTION    . HIATAL HERNIA REPAIR     Family History:  Family History  Problem Relation Age of Onset  . Cancer Father     prostate  . Cancer Mother     lung  . Depression Mother   . Depression Sister   . Anxiety disorder Sister   . Schizophrenia Sister   . Bipolar disorder Sister   . Depression Sister   . Depression Brother   . Heart failure      cousin    Tobacco Screening: Have you used any form of tobacco in Tiffany last 30 days? (Cigarettes, Smokeless Tobacco, Cigars, and/or Pipes): Yes Tobacco use, Select all that apply: 5 or more cigarettes per day (smokes 1 pack of cigarettes daily) Are  you interested in Tobacco Cessation Medications?: Yes, will notify MD for an order Counseled [name] on smoking cessation including recognizing danger situations, developing coping skills and basic information about quitting provided: Yes Social History:  History  Alcohol Use No     History  Drug Use  . Types: Cocaine    Additional Social History:                           Allergies:   Allergies  Allergen Reactions  . Hydroxyzine Other (See Comments)    unknown  . Codeine Nausea And Vomiting  . Sulfa Antibiotics Itching   Lab Results:  Results for orders placed or performed during Tiffany hospital encounter of 12/01/15 (from Tiffany past 48 hour(s))  Glucose, capillary     Status: None   Collection Time: 12/01/15 10:24 PM   Result Value Ref Range   Glucose-Capillary 87 65 - 99 mg/dL  Glucose, capillary     Status: None   Collection Time: 12/02/15  6:53 AM  Result Value Ref Range   Glucose-Capillary 92 65 - 99 mg/dL    Blood Alcohol level:  Lab Results  Component Value Date   ETH <5 11/24/2015   ETH <5 74/12/8784    Metabolic Disorder Labs:  Lab Results  Component Value Date   HGBA1C 5.6 11/25/2015   MPG 114 11/25/2015   MPG 123 (H) 04/24/2011   No results found for: PROLACTIN Lab Results  Component Value Date   CHOL 112 07/15/2013   TRIG 115 07/15/2013   HDL 35 (L) 07/15/2013   CHOLHDL 3.2 07/15/2013   VLDL 23 07/15/2013   LDLCALC 54 07/15/2013    Current Medications: Current Facility-Administered Medications  Medication Dose Route Frequency Provider Last Rate Last Dose  . acetaminophen (TYLENOL) tablet 650 mg  650 mg Oral Q6H PRN Clovis Fredrickson, MD   650 mg at 12/02/15 0808  . albuterol (PROVENTIL HFA;VENTOLIN HFA) 108 (90 Base) MCG/ACT inhaler 1-2 puff  1-2 puff Inhalation Q6H PRN Jolanta B Pucilowska, MD      . alum & mag hydroxide-simeth (MAALOX/MYLANTA) 200-200-20 MG/5ML suspension 30 mL  30 mL Oral Q4H PRN Jolanta B Pucilowska, MD      . amantadine (SYMMETREL) capsule 100 mg  100 mg Oral BID Jolanta B Pucilowska, MD   100 mg at 12/02/15 0930  . amLODipine (NORVASC) tablet 5 mg  5 mg Oral Daily Clovis Fredrickson, MD   5 mg at 12/02/15 0659  . aspirin chewable tablet 81 mg  81 mg Oral Daily Clovis Fredrickson, MD   81 mg at 12/02/15 0807  . atorvastatin (LIPITOR) tablet 40 mg  40 mg Oral q1800 Jolanta B Pucilowska, MD      . escitalopram (LEXAPRO) tablet 20 mg  20 mg Oral Daily Jolanta B Pucilowska, MD   20 mg at 12/02/15 0807  . fluticasone furoate-vilanterol (BREO ELLIPTA) 100-25 MCG/INH 1 puff  1 puff Inhalation Daily Jolanta B Pucilowska, MD      . furosemide (LASIX) tablet 40 mg  40 mg Oral Daily Jolanta B Pucilowska, MD   40 mg at 12/02/15 0809  . gabapentin  (NEURONTIN) capsule 600 mg  600 mg Oral QHS Clovis Fredrickson, MD   600 mg at 12/01/15 2224  . insulin aspart (novoLOG) injection 0-20 Units  0-20 Units Subcutaneous TID WC Jolanta B Pucilowska, MD      . insulin aspart (novoLOG) injection 0-5 Units  0-5 Units  Subcutaneous QHS Jolanta B Pucilowska, MD      . lisinopril (PRINIVIL,ZESTRIL) tablet 40 mg  40 mg Oral Daily Clovis Fredrickson, MD   40 mg at 12/02/15 0807  . magnesium hydroxide (MILK OF MAGNESIA) suspension 30 mL  30 mL Oral Daily PRN Jolanta B Pucilowska, MD      . metFORMIN (GLUCOPHAGE) tablet 500 mg  500 mg Oral BID WC Jolanta B Pucilowska, MD   500 mg at 12/02/15 0807  . mirtazapine (REMERON) tablet 30 mg  30 mg Oral QHS Jolanta B Pucilowska, MD      . nicotine (NICODERM CQ - dosed in mg/24 hours) patch 21 mg  21 mg Transdermal Q0600 Jolanta B Pucilowska, MD      . pantoprazole (PROTONIX) EC tablet 40 mg  40 mg Oral Daily Jolanta B Pucilowska, MD   40 mg at 12/02/15 0700  . potassium chloride (K-DUR,KLOR-CON) CR tablet 10 mEq  10 mEq Oral Daily Clovis Fredrickson, MD   10 mEq at 12/02/15 0808  . tiotropium (SPIRIVA) inhalation capsule 18 mcg  18 mcg Inhalation Daily Jolanta B Pucilowska, MD      . verapamil (CALAN-SR) CR tablet 480 mg  480 mg Oral Daily Clovis Fredrickson, MD   480 mg at 12/02/15 0808  . ziprasidone (GEODON) capsule 80 mg  80 mg Oral Q supper Clovis Fredrickson, MD       PTA Medications: Prescriptions Prior to Admission  Medication Sig Dispense Refill Last Dose  . ACCU-CHEK SOFTCLIX LANCETS lancets    Taking  . albuterol (PROVENTIL HFA;VENTOLIN HFA) 108 (90 Base) MCG/ACT inhaler Inhale 2 puffs into Tiffany lungs every 6 (six) hours as needed. 1 Inhaler 6   . amLODipine (NORVASC) 5 MG tablet Take 2 tablets (10 mg total) by mouth daily. 180 tablet 0   . aspirin 81 MG EC tablet Take 1 tablet (81 mg total) by mouth daily. Swallow whole. 90 tablet 0   . atorvastatin (LIPITOR) 40 MG tablet Take 1 tablet (40 mg  total) by mouth daily. 90 tablet 4   . fluticasone furoate-vilanterol (BREO ELLIPTA) 100-25 MCG/INH AEPB INHALE 1 PUFF EVERY DAY 180 each 0   . furosemide (LASIX) 40 MG tablet Take 1 tablet (40 mg total) by mouth daily. Take Lasix twice daily from 7/14 until 7/18, then daily 30 tablet 0 Taking  . gabapentin (NEURONTIN) 600 MG tablet Take 1 tablet (600 mg total) by mouth 3 (three) times daily. 270 tablet 0   . glucose blood test strip Use to check blood sugar 3 times daily. 100 each 12 Taking  . lisinopril (PRINIVIL,ZESTRIL) 40 MG tablet Take 1 tablet (40 mg total) by mouth daily. 90 tablet 0   . loratadine (CLARITIN) 10 MG tablet Take 1 tablet (10 mg total) by mouth daily. 90 tablet 0 Taking  . metFORMIN (GLUCOPHAGE-XR) 500 MG 24 hr tablet Take 1 tablet (500 mg total) by mouth 2 (two) times daily. 180 tablet 0   . montelukast (SINGULAIR) 10 MG tablet Take 1 tablet (10 mg total) by mouth at bedtime. 90 tablet 3 Taking  . Multiple Vitamins-Minerals (MULTIVITAMIN WITH MINERALS) tablet Take 1 tablet by mouth daily. Reported on 07/28/2015   Taking  . naproxen (NAPROSYN) 500 MG tablet Take 1 tablet (500 mg total) by mouth 3 (three) times daily as needed. ([name] taking differently: Take 500 mg by mouth 3 (three) times daily as needed for mild pain. ) 270 tablet 0 Taking  . omeprazole (PRILOSEC) 40  MG capsule Take 1 capsule (40 mg total) by mouth daily. 90 capsule 1 Taking  . oxybutynin (DITROPAN) 5 MG tablet Take 1 tablet (5 mg total) by mouth 3 (three) times daily. 270 tablet 3 Taking  . potassium chloride (K-DUR,KLOR-CON) 10 MEQ tablet Take 2 tablets (20 mEq total) by mouth daily. ([name] taking differently: Take 10 mEq by mouth 2 (two) times daily. ) 180 tablet 2 Taking  . tiotropium (SPIRIVA HANDIHALER) 18 MCG inhalation capsule Place 1 capsule (18 mcg total) into inhaler and inhale daily. 90 capsule 4   . verapamil (VERELAN PM) 240 MG 24 hr capsule Take 2 capsules (480 mg total) by mouth at bedtime.  180 capsule 0   . ziprasidone (GEODON) 80 MG capsule Take 1 capsule (80 mg total) by mouth at bedtime. 90 capsule 0   . zolpidem (AMBIEN) 5 MG tablet Take 1 tablet (5 mg total) by mouth at bedtime as needed for sleep. 30 tablet 0     Musculoskeletal: Strength & Muscle Tone: within normal limits Gait & Station: normal [name] leans: N/A  Psychiatric Specialty Exam: I reviewed physical exam performed on Tiffany medical floor and agree with Tiffany findings. Physical Exam  Nursing note and vitals reviewed.   Review of Systems  Respiratory: Positive for shortness of breath.   Psychiatric/Behavioral: Positive for depression, hallucinations, substance abuse and suicidal ideas. Tiffany [name] is nervous/anxious and has insomnia.   All other systems reviewed and are negative.   Blood pressure (!) 149/70, pulse 73, temperature 98.4 F (36.9 C), temperature source Oral, resp. rate 18, height 5\' 4"  (1.626 m), weight 108.9 kg (240 lb), SpO2 99 %.Body mass index is 41.2 kg/m.  See SRA.                                                  Sleep:  Number of Hours: 4.3    Treatment Plan Summary: Daily contact with [name] to assess and evaluate symptoms and progress in treatment and Medication management   Tiffany [name] is a 55 year old female with history of depression, mood instability, and cocaine addiction transferred from [facility] for suicidal and homicidal ideation.  1. Suicidal and homicidal ideation. Tiffany [name] is able to contract for safety in Tiffany hospital.  2. Mood and psychosis. Tiffany [name] has been maintained on a combination of Lexapro 40 mg, Geodon 160 mg, Remeron 30 mg for bipolar illness. During her hospitalization her QTC was elevated and she presented with hyponatremia. As a result her Remeron was discontinued, and doses of Geodon and Lexapro were cut in half. Tiffany [name] insists to be back on Remeron that helps her sleep. EKG today is normal. We will continue  daily EKG monitoring. I will restart Remeron 30 mg nightly.  3. EPS. Will start amantadine.  4. COPD. [name] on inhalers and CPAP at night. [name] asking for nebulizers.  5. Hypertension. [name] on amlodipine, aspirin, furosemide, lisinopril, verapamil, and potassium.  6. Dyslipidemia. [name] on Lipitor.  7. Diabetic neuropathy. [name] on Neurontin.  8. Diabetes. [name] on metoprolol, ADA diet, and sliding scale insulin with blood glucose monitoring.  9. GERD. [name] on Protonix.  10. Smoking. [name] on nicotine patch.  11. Substance abuse. Tiffany [name] is addicted to crack. She tried outpatient treatment with no affect. She expects [name] to  place her in Tiffany residential treatment program.  12. Metabolic syndrome monitoring. Lipid profile, hemoglobin A1c and TSH are pending.  13. Disposition. To be established.   Observation Level/Precautions:  15 minute checks  Laboratory:  CBC Chemistry Profile UDS UA  Psychotherapy:    Medications:    Consultations:    Discharge Concerns:    Estimated LOS:  Other:     Physician Treatment Plan for Primary Diagnosis: <principal problem not specified> Long Term Goal(s): Improvement in symptoms so as ready for discharge  Short Term Goals: Ability to identify changes in lifestyle to reduce recurrence of condition will improve, Ability to verbalize feelings will improve, Ability to disclose and discuss suicidal ideas, Ability to demonstrate self-control will improve, Ability to identify and develop effective coping behaviors will improve, Ability to maintain clinical measurements within normal limits will improve and Compliance with prescribed medications will improve  Physician Treatment Plan for Secondary Diagnosis: Active Problems:   Obesity   Type 2 diabetes mellitus (HCC)   Dyslipidemia   COPD (chronic obstructive pulmonary disease) (HCC)   Tobacco use disorder   Bipolar affective disorder, mixed, severe, with psychotic behavior ([place])    Cocaine use disorder, severe, dependence ([place])   Bipolar disorder with severe mania ([place])  Long Term Goal(s): Improvement in symptoms so as ready for discharge  Short Term Goals: Ability to identify changes in lifestyle to reduce recurrence of condition will improve, Ability to demonstrate self-control will improve and Ability to identify triggers associated with substance abuse/mental health issues will improve  I certify that inpatient services furnished can reasonably be expected to improve Tiffany [name]'s condition.    Orson Slick, MD 11/16/20179:33 AM

## 2015-12-02 NOTE — [facility] Group Notes (Signed)
[place] LCSW Group Therapy  12/02/2015 3:00 PM  Type of Therapy:  Group Therapy  Participation Level:  Patient did not attend group. [name] invited patient to group.   Summary of Progress/Problems:Balance in life: Patients will discuss the concept of balance and how it looks and feels to be unbalanced. Pt will identify areas in their life that is unbalanced and ways to become more balanced.    Gervase Colberg G. [place], [place] 12/02/2015, 3:01 PM
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
## 2015-12-02 NOTE — Progress Notes (Signed)
Pt BP was elevated during morning vitals. Given earlier than scheduled due to high Bp. See Flowsheet for vitals.

## 2015-12-02 NOTE — Progress Notes (Signed)
Patient presents with sad, flat affect. Denies SI, HI, AVH. Reports she came in due to a "drug problem" and she wants to get help. Patient is med and group compliant. Appropriate with staff and peers. Minimal interaction and minimal information offered. Patient having high blood pressures with headache. BP medications given as well as prn for pain with good relief. Pt remains safe on unit with q 15 min checks.

## 2015-12-02 NOTE — Progress Notes (Signed)
Pt is sleeping with CPAP on. Respirations even and unlabored. No acute distress noted. Will continue to monitor.

## 2015-12-02 NOTE — Progress Notes (Signed)
Recreation Therapy Notes  Date: 11.16.17 Time: 1:00 pm Location: Craft Room  Group Topic: Leisure Education  Goal Area(s) Addresses:  Patient will ask peer questions related to leisure activities. 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  Behavioral Response: Attentive  Intervention: Leisure Interview  Activity: Patients were paired up and given Leisure Interview worksheets. Patients were instructed to interview each other on leisure activities.  Education: LRT educated patient on what they need to participate in leisure.  Education Outcome: Acknowledges education/In group clarification offered  Clinical Observations/Feedback: Patient interviewed peer. Patient did not contribute to group discussion.  Leonette Monarch, LRT/CTRS 12/02/2015 4:12 PM

## 2015-12-02 NOTE — BHH Suicide Risk Assessment (Signed)
[facility] Admission Suicide Risk Assessment   Nursing information obtained from:    Demographic factors:    Current Mental Status:    Loss Factors:    Historical Factors:    Risk Reduction Factors:     Total Time spent with patient: 1 hour Principal Problem: <principal problem not specified> Diagnosis:   Patient Active Problem List   Diagnosis Date Noted  . Bipolar disorder with severe mania ([place]) [F31.13] 12/01/2015  . Bipolar affective disorder, mixed, severe, with psychotic behavior ([place]) [F31.64] 11/28/2015  . Cocaine use disorder, severe, dependence ([place]) [F14.20] 11/28/2015  . Hyponatremia [E87.1] 11/25/2015  . AKI (acute kidney injury) ([place]) [N17.9] 11/24/2015  . Blood in sputum [R04.2]   . Acute on chronic congestive heart failure ([place]) [I50.9]   . SOB (shortness of breath) [R06.02]   . Hypoxia [R09.02] 08/19/2015  . COPD with acute exacerbation ([place]) [J44.1] 08/19/2015  . Acute on chronic respiratory failure with hypoxia and hypercapnia ([facility]) [I45.80, J96.22] 06/22/2015  . Acute respiratory failure with hypoxemia ([place]) [J96.01] 06/22/2015  . Scabies [B86] 06/01/2015  . Onychomycosis [B35.1] 01/21/2015  . Nasal congestion [R09.81] 12/17/2014  . Tobacco use disorder [F17.200] 07/22/2014  . COPD (chronic obstructive pulmonary disease) ([place]) [J44.9] 07/08/2014  . COPD exacerbation ([place]) [J44.1] 06/26/2014  . Seborrheic keratoses [L82.1] 12/31/2013  . Thrush [B37.0] 09/19/2013  . Knee pain, bilateral [M25.561, M25.562] 01/22/2013  . Seizure ([place]) [R56.9] 01/04/2013  . Health care maintenance [Z00.00] 11/25/2012  . Folliculitis [D98.3] 38/25/0539  . History of kidney stones [Z87.442] 06/18/2012  . Chronic pain syndrome [G89.4] 06/18/2012  . Muscle spasm [M62.838] 06/18/2012  . Elevated troponin [R74.8] 04/28/2012  . Asthma with acute exacerbation [J45.901] 04/28/2012  . Allergic reaction [T78.40XA] 04/07/2012  . Itching [L29.9] 09/06/2011  . HSV infection [B00.9] 08/30/2011   . Dyslipidemia [E78.5] 04/24/2011  . Anemia [D64.9] 04/24/2011  . Diabetic neuropathy ([place]) [E11.40] 04/24/2011  . Obstructive sleep apnea [G47.33] 10/18/2010  . Asthma [493] 10/18/2010  . Obesity [E66.9] 10/18/2010  . Type 2 diabetes mellitus ([place]) [E11.9] 10/18/2010   Subjective Data: suicidal and homicidal ideation.  Continued Clinical Symptoms:  Alcohol Use Disorder Identification Test Final Score (AUDIT): 0 The "Alcohol Use Disorders Identification Test", Guidelines for Use in Primary Care, Second Edition.  World Pharmacologist [facility]). Score between 0-7:  no or low risk or alcohol related problems. Score between 8-15:  moderate risk of alcohol related problems. Score between 16-19:  high risk of alcohol related problems. Score 20 or above:  warrants further diagnostic evaluation for alcohol dependence and treatment.   CLINICAL FACTORS:   Bipolar Disorder:   Mixed State Depression:   Comorbid alcohol abuse/dependence Impulsivity Insomnia Alcohol/Substance Abuse/Dependencies   Musculoskeletal: Strength & Muscle Tone: within normal limits Gait & Station: normal Patient leans: N/A  Psychiatric Specialty Exam: Physical Exam  Nursing note and vitals reviewed.   Review of Systems  Psychiatric/Behavioral: Positive for depression, substance abuse and suicidal ideas.  All other systems reviewed and are negative.   Blood pressure (!) 149/70, pulse 73, temperature 98.4 F (36.9 C), temperature source Oral, resp. rate 18, height 5\' 4"  (1.626 m), weight 108.9 kg (240 lb), SpO2 99 %.Body mass index is 41.2 kg/m.  General Appearance: Casual  Eye Contact:  Good  Speech:  Clear and Coherent  Volume:  Normal  Mood:  Depressed and Hopeless  Affect:  Blunt  Thought Process:  Goal Directed and Descriptions of Associations: Intact  Orientation:  Full (Time, Place, and Person)  Thought  Content:  WDL  Suicidal Thoughts:  Yes.  with intent/plan  Homicidal Thoughts:  No  Memory:   Immediate;   Fair Recent;   Fair Remote;   Fair  Judgement:  Poor  Insight:  Shallow  Psychomotor Activity:  Decreased  Concentration:  Concentration: Fair and Attention Span: Fair  Recall:  [employer] of Knowledge:  Fair  Language:  Fair  Akathisia:  No  Handed:  Right  AIMS (if indicated):     Assets:  Communication Skills Desire for Improvement Financial Resources/Insurance Housing Resilience Social Support  ADL's:  Intact  Cognition:  WNL  Sleep:  Number of Hours: 4.3      COGNITIVE FEATURES THAT CONTRIBUTE TO RISK:  None    SUICIDE RISK:   Moderate:  Frequent suicidal ideation with limited intensity, and duration, some specificity in terms of plans, no associated intent, good self-control, limited dysphoria/symptomatology, some risk factors present, and identifiable protective factors, including available and accessible social support.   PLAN OF CARE: Hospital admission, medication management, substance abuse counseling, discharge planning.  Ms. Vinciguerra is a 55 year old female with history of depression, mood instability, and cocaine addiction transferred from [facility] for suicidal and homicidal ideation.  1. Suicidal and homicidal ideation. The patient is able to contract for safety in the hospital.  2. Mood and psychosis. The patient has been maintained on a combination of Lexapro 40 mg, Geodon 160 mg, Remeron 30 mg for bipolar illness. During her hospitalization her QTC was elevated and she presented with hyponatremia. As a result her Remeron was discontinued, and doses of Geodon and Lexapro were cut in half. The patient insists to be back on Remeron that helps her sleep. EKG today is normal. We will continue daily EKG monitoring. I will restart Remeron 30 mg nightly.  3. EPS. Will start amantadine.  4. COPD. [name] on inhalers and CPAP at night. [name] asking for nebulizers.  5. Hypertension. [name] on amlodipine, aspirin, furosemide, lisinopril,  verapamil, and potassium.  6. Dyslipidemia. [name] on Lipitor.  7. Diabetic neuropathy. [name] on Neurontin.  8. Diabetes. [name] on metoprolol, ADA diet, and sliding scale insulin with blood glucose monitoring.  9. GERD. [name] on Protonix.  10. Smoking. [name] on nicotine patch.  11. Substance abuse. The patient is addicted to crack. She tried outpatient treatment with no affect. She expects [name] to place her in the residential treatment program.  12. Metabolic syndrome monitoring. Lipid profile, hemoglobin A1c and TSH are pending.  13. Disposition. To be established.  I certify that inpatient services furnished can reasonably be expected to improve the patient's condition.  Orson Slick, MD 12/02/2015, 9:23 AM

## 2015-12-02 NOTE — Progress Notes (Signed)
Recreation Therapy Notes  INPATIENT RECREATION THERAPY ASSESSMENT  Patient Details Name: Tiffany [name] MRN: [id number] DOB: 01-22-1960 Today's Date: 12/02/2015  Patient Stressors: Other (Comment) (Drugs)  Coping Skills:   Isolate, Substance Abuse, Talking, Music, Sports  Personal Challenges: Substance Abuse  Leisure Interests (2+):   (Been on drugs for so long she has forgotten what she likes to do for fun)  Awareness of Community Resources:  Yes  Community Resources:  [employer], [place]  Current Use: No  If no, Barriers?: Physical  Patient Strengths:  Determined, doesn't give up easily  Patient Identified Areas of Improvement:  "I can't think of any"  Current Recreation Participation:  Nothing  Patient Goal for Hospitalization:  To try to get in treatment to get off drugs  [place] of Residence:  [place] of Residence:  [place]   Current [place] (including self-harm):  No  Current HI:  No  Consent to Intern Participation: N/A   Leonette Monarch, LRT/CTRS 12/02/2015, 4:30 PM

## 2015-12-03 LAB — GLUCOSE, CAPILLARY
Glucose-Capillary: 100 mg/dL — ABNORMAL HIGH (ref 65–99)
Glucose-Capillary: 113 mg/dL — ABNORMAL HIGH (ref 65–99)
Glucose-Capillary: 88 mg/dL (ref 65–99)
Glucose-Capillary: 93 mg/dL (ref 65–99)

## 2015-12-03 MED ORDER — MONTELUKAST SODIUM 10 MG PO TABS
10.0000 mg | ORAL_TABLET | Freq: Every day | ORAL | Status: DC
  Administered 2015-12-03 – 2015-12-05 (×3): 10 mg via ORAL
  Filled 2015-12-03 (×3): qty 1

## 2015-12-03 MED ORDER — BACITRACIN-NEOMYCIN-POLYMYXIN 400-5-5000 EX OINT
TOPICAL_OINTMENT | Freq: Two times a day (BID) | CUTANEOUS | Status: DC
  Administered 2015-12-03 – 2015-12-06 (×6): 1 via TOPICAL
  Filled 2015-12-03 (×6): qty 1

## 2015-12-03 NOTE — Progress Notes (Signed)
EKG performed and given to [facility], RN to place on chart

## 2015-12-03 NOTE — Progress Notes (Signed)
Patient denies suicidal or homicidal ideations and AV hallucinations.Her mood is brighter.Patient is pleasant & cooperative on approach.[name] hopeful about the residential program.Appropriate with staff & peers.Attended groups.Compliant with medications.Appetite & energy level good.Support & encouragement given.

## 2015-12-03 NOTE — Progress Notes (Signed)
Recreation Therapy Notes  Date: 11.17.17 Time: 1:00 pm Location: Craft Room  Group Topic: Social Skills  Goal Area(s) Addresses:  Patient will effectively work with peer towards shared goal. Patient will identify skills used to make activity successful. Patient will identify benefit of using group skills effectively post d/c.  Behavioral Response: Attentive, Interactive  Intervention: [employer]  Activity: Patients were given 15 pipe cleaners and were instructed to build a free standing tower. Patients were given 2 minutes to strategize. After about 5 minutes of building, patients were told to put their dominant hand behind their back. After about 3 minutes of building, patients were told they could not talk to each other.  Education: LRT educated patients on healthy support systems.  Education Outcome: Acknowledges education/In group clarification offered   Clinical Observations/Feedback: Patient worked with peers to build tower. Patient used effective communication, problem solving, and teamwork. Patient contributed to group discussion by stating how she felt when LRT told them to put their hand behind their back and to stop talking to each other.  Leonette Monarch, LRT/CTRS 12/03/2015 4:05 PM

## 2015-12-03 NOTE — Progress Notes (Signed)
[facility] MD Progress Note  12/03/2015 5:09 PM Tiffany [name]  MRN:  [id number]  Subjective:  Tiffany [name] is a 55 year old female with a history of schizoaffective disorder and cocaine dependence transferred from [place] floor where she was hospitalized for medical consequences of cocaine binge. She had hyponatremia and QTC prolongation. They discontinued Remeron and decreased dose of Celexa and Geodon by 50%. Sodium level and EKG normalized. Upon her request, I restarted Remeron for sleep and her QT is 440 today. We continue daily EKGs.  Tiffany [name] slept well last night, feels much reinvigorated and ready to address her cocaine addiction. She will be discharged on Monday to participate in [facility] residential program. Her mood has improved and she is much more hopeful. She denies any physical symptoms. Good group participation.   Principal Problem: <principal problem not specified> Diagnosis:   Patient Active Problem List   Diagnosis Date Noted  . Bipolar disorder with severe mania ([place]) [F31.13] 12/01/2015  . Bipolar affective disorder, mixed, severe, with psychotic behavior ([place]) [F31.64] 11/28/2015  . Cocaine use disorder, severe, dependence ([name]) [F14.20] 11/28/2015  . Hyponatremia [E87.1] 11/25/2015  . AKI (acute kidney injury) ([place]) [N17.9] 11/24/2015  . Blood in sputum [R04.2]   . Acute on chronic congestive heart failure ([place]) [I50.9]   . SOB (shortness of breath) [R06.02]   . Hypoxia [R09.02] 08/19/2015  . COPD with acute exacerbation ([place]) [J44.1] 08/19/2015  . Acute on chronic respiratory failure with hypoxia and hypercapnia (HCC) [O03.21, J96.22] 06/22/2015  . Acute respiratory failure with hypoxemia ([place]) [J96.01] 06/22/2015  . Scabies [B86] 06/01/2015  . Onychomycosis [B35.1] 01/21/2015  . Nasal congestion [R09.81] 12/17/2014  . Tobacco use disorder [F17.200] 07/22/2014  . COPD (chronic obstructive pulmonary disease) ([place]) [J44.9] 07/08/2014  . COPD exacerbation  ([place]) [J44.1] 06/26/2014  . Seborrheic keratoses [L82.1] 12/31/2013  . Thrush [B37.0] 09/19/2013  . Knee pain, bilateral [M25.561, M25.562] 01/22/2013  . Seizure ([place]) [R56.9] 01/04/2013  . Health care maintenance [Z00.00] 11/25/2012  . Folliculitis [Y24.8] 25/00/3704  . History of kidney stones [Z87.442] 06/18/2012  . Chronic pain syndrome [G89.4] 06/18/2012  . Muscle spasm [M62.838] 06/18/2012  . Elevated troponin [R74.8] 04/28/2012  . Asthma with acute exacerbation [J45.901] 04/28/2012  . Allergic reaction [T78.40XA] 04/07/2012  . Itching [L29.9] 09/06/2011  . HSV infection [B00.9] 08/30/2011  . Dyslipidemia [E78.5] 04/24/2011  . Anemia [D64.9] 04/24/2011  . Diabetic neuropathy ([place]) [E11.40] 04/24/2011  . Obstructive sleep apnea [G47.33] 10/18/2010  . Asthma [493] 10/18/2010  . Obesity [E66.9] 10/18/2010  . Type 2 diabetes mellitus ([place]) [E11.9] 10/18/2010   Total Time spent with patient: 20 minutes  Past Psychiatric History: schizoaffective disorder.  Past Medical History:  Past Medical History:  Diagnosis Date  . Anxiety   . Asthma   . Cocaine abuse   . Complication of anesthesia    decreased bp, decreased heart rate  . Diabetes mellitus    class 2  . Disorder of nervous system   . Emphysema   . Heart attack   . High blood pressure   . High cholesterol   . Hypertension   . Incontinence   . Manic depression ([place])   . Myocardial infarct   . Pneumonia 06/2015  . Seasonal allergies   . Seizures ([place])   . Sinus trouble   . Sleep apnea   . Stroke [facility])     Past Surgical History:  Procedure Laterality Date  . ABDOMINAL HYSTERECTOMY    . CESAREAN SECTION    .  HIATAL HERNIA REPAIR     Family History:  Family History  Problem Relation Age of Onset  . Cancer Father     prostate  . Cancer Mother     lung  . Depression Mother   . Depression Sister   . Anxiety disorder Sister   . Schizophrenia Sister   . Bipolar disorder Sister   . Depression Sister   .  Depression Brother   . Heart failure      cousin   Family Psychiatric  See H&P. Social History:  History  Alcohol Use No     History  Drug Use  . Types: Cocaine    Social History   Social History  . Marital status: Widowed    Spouse name: N/A  . Number of children: 3  . Years of education: N/A   Occupational History  . disabled     factory production   Social History Main Topics  . Smoking status: Current Every Day Smoker    Packs/day: 0.50    Years: 37.00    Types: Cigarettes    Start date: 01/16/1977  . Smokeless tobacco: Never Used     Comment: Previous 2 PPD menthol full flavor  . Alcohol use No  . Drug use:     Types: Cocaine  . Sexual activity: No   Other Topics Concern  . None   Social History Narrative  . None   Additional Social History:                         Sleep: Poor  Appetite:  Fair  Current Medications: Current Facility-Administered Medications  Medication Dose Route Frequency Provider Last Rate Last Dose  . acetaminophen (TYLENOL) tablet 650 mg  650 mg Oral Q6H PRN Clovis Fredrickson, MD   650 mg at 12/02/15 0808  . albuterol (PROVENTIL HFA;VENTOLIN HFA) 108 (90 Base) MCG/ACT inhaler 1-2 puff  1-2 puff Inhalation Q6H PRN Adyline Huberty B Joziyah Roblero, MD      . alum & mag hydroxide-simeth (MAALOX/MYLANTA) 200-200-20 MG/5ML suspension 30 mL  30 mL Oral Q4H PRN Shriyan Arakawa B Matti Minney, MD      . amantadine (SYMMETREL) capsule 100 mg  100 mg Oral BID Miara Emminger B Myrle Dues, MD   100 mg at 12/03/15 1701  . amLODipine (NORVASC) tablet 5 mg  5 mg Oral Daily Emanuel Dowson B Aviona Martenson, MD   5 mg at 12/03/15 0820  . aspirin chewable tablet 81 mg  81 mg Oral Daily Clovis Fredrickson, MD   81 mg at 12/03/15 0819  . atorvastatin (LIPITOR) tablet 40 mg  40 mg Oral q1800 Cadence Minton B Merritt Kibby, MD   40 mg at 12/03/15 1700  . escitalopram (LEXAPRO) tablet 20 mg  20 mg Oral Daily Clovis Fredrickson, MD   20 mg at 12/03/15 0819  . fluticasone furoate-vilanterol  (BREO ELLIPTA) 100-25 MCG/INH 1 puff  1 puff Inhalation Daily Jonahtan Manseau B Adylin Hankey, MD   1 puff at 12/03/15 0823  . furosemide (LASIX) tablet 40 mg  40 mg Oral Daily Reggie Bise B Sean Malinowski, MD   40 mg at 12/03/15 0819  . gabapentin (NEURONTIN) capsule 600 mg  600 mg Oral QHS Clovis Fredrickson, MD   600 mg at 12/02/15 2112  . insulin aspart (novoLOG) injection 0-20 Units  0-20 Units Subcutaneous TID WC Osmond Steckman B Alpheus Stiff, MD      . insulin aspart (novoLOG) injection 0-5 Units  0-5 Units Subcutaneous QHS Clovis Fredrickson, MD      .  lisinopril (PRINIVIL,ZESTRIL) tablet 40 mg  40 mg Oral Daily Clovis Fredrickson, MD   40 mg at 12/03/15 0819  . magnesium hydroxide (MILK OF MAGNESIA) suspension 30 mL  30 mL Oral Daily PRN Chanz Cahall B Roseanna Koplin, MD      . metFORMIN (GLUCOPHAGE) tablet 500 mg  500 mg Oral BID WC Melanie Openshaw B Tidus Upchurch, MD   500 mg at 12/03/15 1700  . mirtazapine (REMERON) tablet 30 mg  30 mg Oral QHS Clovis Fredrickson, MD   30 mg at 12/02/15 2112  . montelukast (SINGULAIR) tablet 10 mg  10 mg Oral QHS Rome Schlauch B Luree Palla, MD      . neomycin-bacitracin-polymyxin (NEOSPORIN) ointment   Topical BID Clovis Fredrickson, MD   1 application at 71/69/67 1701  . nicotine (NICODERM CQ - dosed in mg/24 hours) patch 21 mg  21 mg Transdermal Q0600 Clovis Fredrickson, MD   21 mg at 12/03/15 1208  . pantoprazole (PROTONIX) EC tablet 40 mg  40 mg Oral Daily Amariyana Heacox B Felton Buczynski, MD   40 mg at 12/03/15 0820  . potassium chloride (K-DUR,KLOR-CON) CR tablet 10 mEq  10 mEq Oral Daily Clovis Fredrickson, MD   10 mEq at 12/03/15 0819  . tiotropium (SPIRIVA) inhalation capsule 18 mcg  18 mcg Inhalation Daily Clovis Fredrickson, MD   18 mcg at 12/03/15 0821  . verapamil (CALAN-SR) CR tablet 480 mg  480 mg Oral Daily Aaira Oestreicher B Torrie Lafavor, MD   480 mg at 12/03/15 0819  . ziprasidone (GEODON) capsule 80 mg  80 mg Oral Q supper Clovis Fredrickson, MD   80 mg at 12/02/15 2000    Lab Results:   Results for orders placed or performed during the hospital encounter of 12/01/15 (from the past 48 hour(s))  Glucose, capillary     Status: None   Collection Time: 12/01/15 10:24 PM  Result Value Ref Range   Glucose-Capillary 87 65 - 99 mg/dL  Glucose, capillary     Status: None   Collection Time: 12/02/15  6:53 AM  Result Value Ref Range   Glucose-Capillary 92 65 - 99 mg/dL  Glucose, capillary     Status: None   Collection Time: 12/02/15 11:53 AM  Result Value Ref Range   Glucose-Capillary 93 65 - 99 mg/dL  Glucose, capillary     Status: None   Collection Time: 12/02/15  4:46 PM  Result Value Ref Range   Glucose-Capillary 88 65 - 99 mg/dL  Glucose, capillary     Status: None   Collection Time: 12/02/15  8:41 PM  Result Value Ref Range   Glucose-Capillary 82 65 - 99 mg/dL   Comment 1 Notify RN   Glucose, capillary     Status: Abnormal   Collection Time: 12/03/15  6:41 AM  Result Value Ref Range   Glucose-Capillary 100 (H) 65 - 99 mg/dL   Comment 1 Notify RN   Glucose, capillary     Status: Abnormal   Collection Time: 12/03/15 12:05 PM  Result Value Ref Range   Glucose-Capillary 113 (H) 65 - 99 mg/dL  Glucose, capillary     Status: None   Collection Time: 12/03/15  4:57 PM  Result Value Ref Range   Glucose-Capillary 88 65 - 99 mg/dL    Blood Alcohol level:  Lab Results  Component Value Date   [facility] <5 11/24/2015   ETH <5 89/38/1017    Metabolic Disorder Labs: Lab Results  Component Value Date   HGBA1C 5.6 11/25/2015  MPG 114 11/25/2015   MPG 123 (H) 04/24/2011   No results found for: PROLACTIN Lab Results  Component Value Date   CHOL 112 07/15/2013   TRIG 115 07/15/2013   HDL 35 (L) 07/15/2013   CHOLHDL 3.2 07/15/2013   VLDL 23 07/15/2013   LDLCALC 54 07/15/2013    Physical Findings: AIMS:  , ,  ,  ,    CIWA:    COWS:     Musculoskeletal: Strength & Muscle Tone: within normal limits Gait & Station: normal Patient leans: N/A  Psychiatric  Specialty Exam: Physical Exam  Nursing note and vitals reviewed.   Review of Systems  Respiratory: Positive for shortness of breath.   Psychiatric/Behavioral: Positive for depression, substance abuse and suicidal ideas. The patient has insomnia.   All other systems reviewed and are negative.   Blood pressure 135/80, pulse 67, temperature 97.5 F (36.4 C), resp. rate 18, height 5\' 4"  (1.626 m), weight 108.9 kg (240 lb), SpO2 99 %.Body mass index is 41.2 kg/m.  General Appearance: Casual  Eye Contact:  Good  Speech:  Clear and Coherent  Volume:  Normal  Mood:  Anxious and Depressed  Affect:  Appropriate  Thought Process:  Goal Directed and Descriptions of Associations: Intact  Orientation:  Full (Time, Place, and Person)  Thought Content:  WDL  Suicidal Thoughts:  No  Homicidal Thoughts:  No  Memory:  Immediate;   Fair Recent;   Fair Remote;   Fair  Judgement:  Impaired  Insight:  Lacking  Psychomotor Activity:  Normal  Concentration:  Concentration: Fair and Attention Span: Fair  Recall:  [employer] of Knowledge:  Fair  Language:  Fair  Akathisia:  No  Handed:  Right  AIMS (if indicated):     Assets:  Communication Skills Desire for Improvement Financial Resources/Insurance Resilience Social Support  ADL's:  Intact  Cognition:  WNL  Sleep:  Number of Hours: 6.75     Treatment Plan Summary: Daily contact with patient to assess and evaluate symptoms and progress in treatment and Medication management   Tiffany [name] is a 55 year old female with history of depression, mood instability, and cocaine addiction transferred from [facility] for suicidal and homicidal ideation.  1. Suicidal and homicidal ideation. The patient is able to contract for safety in the hospital.  2. Mood and psychosis. The patient has been maintained on a combination of Lexapro 40 mg, Geodon 160 mg, Remeron 30 mg for bipolar illness. During her hospitalization her QTC was elevated and  she presented with hyponatremia. As a result her Remeron was discontinued, and doses of Geodon and Lexapro were cut in half.   3. Insomnia. The patient insists to be back on Remeron that helps her sleep. EKG today shows prolonged QT. We will continue daily EKG monitoring.   4. COPD. [name] on inhalers and CPAP at night.   5. Hypertension. [name] on amlodipine, aspirin, furosemide, lisinopril, verapamil, and potassium.  6. Dyslipidemia. [name] on Lipitor.  7. Diabetic neuropathy. [name] on Neurontin.  8. Diabetes. [name] on metoprolol, ADA diet, and sliding scale insulin with blood glucose monitoring.  9. GERD. [name] on Protonix.  10. Smoking. [name] on nicotine patch.  11. Substance abuse. The patient is addicted to crack. She tried outpatient treatment with no affect. She expects [name] to place her in the residential treatment program.  12. Metabolic syndrome monitoring. Lipid profile, hemoglobin A1c and TSH are pending.  13. EPS. We  added amantadine.   14. Disposition. She will be discharged to [facility] residential treatment program on Monday.    Orson Slick, MD 12/03/2015, 5:09 PM

## 2015-12-03 NOTE — Plan of Care (Signed)
Problem: Coping: Goal: Ability to verbalize feelings will improve Outcome: Progressing Patient verbalized feelings to staff.    

## 2015-12-03 NOTE — [facility] Group Notes (Signed)
[place] LCSW Group Therapy  12/03/2015 4:05 PM  Type of Therapy:  Group Therapy  Participation Level:  Active  Participation Quality:  Appropriate and Sharing  Affect:  Appropriate  Cognitive:  Alert  Insight:  Improving  Engagement in Therapy:  Engaged  Modes of Intervention:  Activity, Discussion, Education and Support  Summary of Progress/Problems:Feelings around Relapse. Group members discussed the meaning of relapse and shared personal stories of relapse, how it affected them and others, and how they perceived themselves during this time. Group members were encouraged to identify triggers, warning signs and coping skills used when facing the possibility of relapse. Social supports were discussed and explored in detail. Patients also discussed facing disappointment and how that can trigger someone to relapse. Patient stated she has the support from her son.    Shray Hunley G. Carroll, [place] 12/03/2015, 4:10 PM

## 2015-12-03 NOTE — Progress Notes (Signed)
D: Pt denies SI/HI/AVH. Pt is pleasant and cooperative with treatment plan, affect is flat and sad with some delayed response noted. Patient's thoughts are organized, she appears less anxious, minimal interaction with peers and staff noted.  A: Pt was offered support and encouragement. Pt was given scheduled medications. Pt was encouraged to attend groups. Q 15 minute checks were done for safety.  R:Pt attends groups and interacts well with peers and staff. Pt is taking medication. Pt has no complaints.Pt receptive to treatment and safety maintained on unit.

## 2015-12-03 NOTE — Plan of Care (Signed)
Problem: [facility] Participation in Recreation Therapeutic Interventions Goal: STG-Patient will identify at least five coping skills for ** STG: Coping Skills - Within 4 treatment sessions, patient will verbalize at least 5 coping skills for substance abuse in each of 2 treatment sessions to decrease substance abuse post d/c.  Outcome: Progressing Treatment Session 1; Completed  1 out of 2: At approximately 3:00 pm, LRT met with patient in craft room. Patient verbalized 5 coping skills for substance abuse. LRT educated patient on leisure and why it is important to implement it into her schedule. LRT educated and provided patient with blank schedules to help her plan her day and try to avoid using substances. LRT educated patient on healthy support systems.  Leonette Monarch, LRT/CTRS 11.17.17 3:34 pm

## 2015-12-03 NOTE — Progress Notes (Signed)
RN will call when pt is ready to place Cpap.

## 2015-12-04 DIAGNOSIS — F3113 Bipolar disorder, current episode manic without psychotic features, severe: Secondary | ICD-10-CM

## 2015-12-04 LAB — LIPID PANEL
Cholesterol: 135 mg/dL (ref 0–200)
HDL: 55 mg/dL (ref >40)
LDL Cholesterol: 62 mg/dL (ref 0–99)
Total CHOL/HDL Ratio: 2.5 RATIO
Triglycerides: 90 mg/dL (ref <150)
VLDL: 18 mg/dL (ref 0–40)

## 2015-12-04 LAB — GLUCOSE, CAPILLARY
Glucose-Capillary: 76 mg/dL (ref 65–99)
Glucose-Capillary: 91 mg/dL (ref 65–99)
Glucose-Capillary: 98 mg/dL (ref 65–99)

## 2015-12-04 MED ORDER — NAPROXEN 250 MG PO TABS
250.0000 mg | ORAL_TABLET | Freq: Two times a day (BID) | ORAL | Status: DC
  Administered 2015-12-04 – 2015-12-06 (×4): 250 mg via ORAL
  Filled 2015-12-04 (×4): qty 1

## 2015-12-04 MED ORDER — MIRTAZAPINE 15 MG PO TABS
30.0000 mg | ORAL_TABLET | Freq: Every day | ORAL | Status: DC
  Administered 2015-12-04 – 2015-12-05 (×2): 30 mg via ORAL
  Filled 2015-12-04 (×2): qty 2

## 2015-12-04 MED ORDER — MIRTAZAPINE 15 MG PO TABS: 45.0000 mg | ORAL_TABLET | Freq: Every day | ORAL | Status: DC

## 2015-12-04 NOTE — Plan of Care (Signed)
Problem: Self-Concept: Goal: Ability to disclose and discuss suicidal ideas will improve Outcome: Progressing Patient discloses that she is not having suicidal ideations

## 2015-12-04 NOTE — Progress Notes (Signed)
Patient has been very pleasant today. Has multiple medical problems. Attended groups today. Is A&O. Steady gait. States she slept good and not having any issues. States her goal for the day is to realize people need time to let go of the past. Has attended groups and interacting with peers. Q15 minute checks maintained for safety. Will continue to monitor.

## 2015-12-04 NOTE — Progress Notes (Signed)
EKG completed and given to RN. No calls throughout shift to place CPAP

## 2015-12-04 NOTE — Progress Notes (Signed)
.  D: Pt denies SI/HI/AVH. Pt is pleasant and cooperative. Pt appears less anxious and she is interacting with peers and staff appropriately.  A: Pt was offered support and encouragement. Pt was given scheduled medications. Pt was encouraged to attend groups. Q 15 minute checks were done for safety.  R:Pt attends groups and interacts well with peers and staff. Pt is taking medication. Pt has no complaints.Pt receptive to treatment and safety maintained on unit.

## 2015-12-04 NOTE — Progress Notes (Signed)
[facility] MD Progress Note  12/04/2015 2:53 PM Tiffany [name]  MRN:  [id number]  Subjective:  Tiffany [name] is a 55 year old female with a history of schizoaffective disorder and cocaine dependence transferred from [place] floor where she was hospitalized for medical consequences of cocaine binge. She had hyponatremia and QTC prolongation. They discontinued Remeron and decreased dose of Celexa and Geodon by 50%. Sodium level and EKG normalized. Upon her request, I restarted Remeron for sleep and her QT is 440 today. We continue daily EKGs.  Tiffany [name] slept well last night, feels much reinvigorated and ready to address her cocaine addiction. She will be discharged on Monday to participate in [facility] residential program. Her mood has improved and she is much more hopeful. She denies any physical symptoms. Good group participation.   11/18 patient denies suicidality, homicidality or auditory or visual hallucinations. She describes her mood as good. She denies any physical complaints. She states that her main issue today is lack of sleep. Looks like per nursing notes she slept 5 hours.  [name] requesting to increase the mirtazapine.   Per nursing: .D: Pt denies SI/HI/AVH. Pt is pleasant and cooperative. Pt appears less anxious and she is interacting with peers and staff appropriately.  A: Pt was offered support and encouragement. Pt was given scheduled medications. Pt was encouraged to attend groups. Q 15 minute checks were done for safety.  R:Pt attends groups and interacts well with peers and staff. Pt is taking medication. Pt has no complaints.Pt receptive to treatment and safety maintained on unit.   Principal Problem: Bipolar disorder with severe mania ([place]) Diagnosis:   Patient Active Problem List   Diagnosis Date Noted  . Bipolar disorder with severe mania ([place]) [F31.13] 12/01/2015  . Bipolar affective disorder, mixed, severe, with psychotic behavior ([place]) [F31.64] 11/28/2015  .  Cocaine use disorder, severe, dependence ([place]) [F14.20] 11/28/2015  . Hyponatremia [E87.1] 11/25/2015  . AKI (acute kidney injury) ([place]) [N17.9] 11/24/2015  . Blood in sputum [R04.2]   . Acute on chronic congestive heart failure ([place]) [I50.9]   . SOB (shortness of breath) [R06.02]   . Hypoxia [R09.02] 08/19/2015  . COPD with acute exacerbation ([place]) [J44.1] 08/19/2015  . Acute on chronic respiratory failure with hypoxia and hypercapnia (HCC) [O53.66, J96.22] 06/22/2015  . Acute respiratory failure with hypoxemia ([place]) [J96.01] 06/22/2015  . Scabies [B86] 06/01/2015  . Onychomycosis [B35.1] 01/21/2015  . Nasal congestion [R09.81] 12/17/2014  . Tobacco use disorder [F17.200] 07/22/2014  . COPD (chronic obstructive pulmonary disease) ([place]) [J44.9] 07/08/2014  . COPD exacerbation ([place]) [J44.1] 06/26/2014  . Seborrheic keratoses [L82.1] 12/31/2013  . Thrush [B37.0] 09/19/2013  . Knee pain, bilateral [M25.561, M25.562] 01/22/2013  . Seizure ([place]) [R56.9] 01/04/2013  . Health care maintenance [Z00.00] 11/25/2012  . Folliculitis [Y40.3] 47/42/5956  . History of kidney stones [Z87.442] 06/18/2012  . Chronic pain syndrome [G89.4] 06/18/2012  . Muscle spasm [M62.838] 06/18/2012  . Elevated troponin [R74.8] 04/28/2012  . Asthma with acute exacerbation [J45.901] 04/28/2012  . Allergic reaction [T78.40XA] 04/07/2012  . Itching [L29.9] 09/06/2011  . HSV infection [B00.9] 08/30/2011  . Dyslipidemia [E78.5] 04/24/2011  . Anemia [D64.9] 04/24/2011  . Diabetic neuropathy ([place]) [E11.40] 04/24/2011  . Obstructive sleep apnea [G47.33] 10/18/2010  . Asthma [493] 10/18/2010  . Obesity [E66.9] 10/18/2010  . Type 2 diabetes mellitus ([place]) [E11.9] 10/18/2010   Total Time spent with patient: 20 minutes  Past Psychiatric History: schizoaffective disorder.  Past Medical History:  Past Medical History:  Diagnosis  Date  . Anxiety   . Asthma   . Cocaine abuse   . Complication of anesthesia     decreased bp, decreased heart rate  . Diabetes mellitus    class 2  . Disorder of nervous system   . Emphysema   . Heart attack   . High blood pressure   . High cholesterol   . Hypertension   . Incontinence   . Manic depression ([place])   . Myocardial infarct   . Pneumonia 06/2015  . Seasonal allergies   . Seizures ([place])   . Sinus trouble   . Sleep apnea   . Stroke [facility])     Past Surgical History:  Procedure Laterality Date  . ABDOMINAL HYSTERECTOMY    . CESAREAN SECTION    . HIATAL HERNIA REPAIR     Family History:  Family History  Problem Relation Age of Onset  . Cancer Father     prostate  . Cancer Mother     lung  . Depression Mother   . Depression Sister   . Anxiety disorder Sister   . Schizophrenia Sister   . Bipolar disorder Sister   . Depression Sister   . Depression Brother   . Heart failure      cousin   Family Psychiatric  See H&P. Social History:  History  Alcohol Use No     History  Drug Use  . Types: Cocaine    Social History   Social History  . Marital status: Widowed    Spouse name: N/A  . Number of children: 3  . Years of education: N/A   Occupational History  . disabled     factory production   Social History Main Topics  . Smoking status: Current Every Day Smoker    Packs/day: 0.50    Years: 37.00    Types: Cigarettes    Start date: 01/16/1977  . Smokeless tobacco: Never Used     Comment: Previous 2 PPD menthol full flavor  . Alcohol use No  . Drug use:     Types: Cocaine  . Sexual activity: No   Other Topics Concern  . None   Social History Narrative  . None     Current Medications: Current Facility-Administered Medications  Medication Dose Route Frequency Provider Last Rate Last Dose  . acetaminophen (TYLENOL) tablet 650 mg  650 mg Oral Q6H PRN Clovis Fredrickson, MD   650 mg at 12/02/15 0808  . albuterol (PROVENTIL HFA;VENTOLIN HFA) 108 (90 Base) MCG/ACT inhaler 1-2 puff  1-2 puff Inhalation Q6H PRN Clovis Fredrickson, MD   2 puff at 12/04/15 0846  . alum & mag hydroxide-simeth (MAALOX/MYLANTA) 200-200-20 MG/5ML suspension 30 mL  30 mL Oral Q4H PRN Jolanta B Pucilowska, MD      . amantadine (SYMMETREL) capsule 100 mg  100 mg Oral BID Clovis Fredrickson, MD   100 mg at 12/04/15 0849  . amLODipine (NORVASC) tablet 5 mg  5 mg Oral Daily Clovis Fredrickson, MD   5 mg at 12/04/15 0849  . aspirin chewable tablet 81 mg  81 mg Oral Daily Jolanta B Pucilowska, MD   81 mg at 12/04/15 0849  . atorvastatin (LIPITOR) tablet 40 mg  40 mg Oral q1800 Jolanta B Pucilowska, MD   40 mg at 12/03/15 1700  . escitalopram (LEXAPRO) tablet 20 mg  20 mg Oral Daily Jolanta B Pucilowska, MD   20 mg at 12/04/15 0849  . fluticasone furoate-vilanterol (BREO ELLIPTA)  100-25 MCG/INH 1 puff  1 puff Inhalation Daily Jolanta B Pucilowska, MD   1 puff at 12/04/15 0845  . furosemide (LASIX) tablet 40 mg  40 mg Oral Daily Jolanta B Pucilowska, MD   40 mg at 12/04/15 0849  . gabapentin (NEURONTIN) capsule 600 mg  600 mg Oral QHS Clovis Fredrickson, MD   600 mg at 12/03/15 2125  . insulin aspart (novoLOG) injection 0-20 Units  0-20 Units Subcutaneous TID WC Jolanta B Pucilowska, MD      . insulin aspart (novoLOG) injection 0-5 Units  0-5 Units Subcutaneous QHS Jolanta B Pucilowska, MD      . lisinopril (PRINIVIL,ZESTRIL) tablet 40 mg  40 mg Oral Daily Jolanta B Pucilowska, MD   40 mg at 12/04/15 0849  . magnesium hydroxide (MILK OF MAGNESIA) suspension 30 mL  30 mL Oral Daily PRN Jolanta B Pucilowska, MD      . metFORMIN (GLUCOPHAGE) tablet 500 mg  500 mg Oral BID WC Jolanta B Pucilowska, MD   500 mg at 12/04/15 0849  . mirtazapine (REMERON) tablet 45 mg  45 mg Oral QHS Hildred Priest, MD      . montelukast (SINGULAIR) tablet 10 mg  10 mg Oral QHS Jolanta B Pucilowska, MD   10 mg at 12/03/15 2125  . naproxen (NAPROSYN) tablet 250 mg  250 mg Oral BID WC Hildred Priest, MD      . neomycin-bacitracin-polymyxin  (NEOSPORIN) ointment   Topical BID Clovis Fredrickson, MD   1 application at 36/14/43 0848  . nicotine (NICODERM CQ - dosed in mg/24 hours) patch 21 mg  21 mg Transdermal Q0600 Clovis Fredrickson, MD   21 mg at 12/04/15 0848  . pantoprazole (PROTONIX) EC tablet 40 mg  40 mg Oral Daily Jolanta B Pucilowska, MD   40 mg at 12/04/15 0849  . potassium chloride (K-DUR,KLOR-CON) CR tablet 10 mEq  10 mEq Oral Daily Clovis Fredrickson, MD   10 mEq at 12/04/15 0849  . tiotropium (SPIRIVA) inhalation capsule 18 mcg  18 mcg Inhalation Daily Clovis Fredrickson, MD   18 mcg at 12/04/15 0845  . verapamil (CALAN-SR) CR tablet 480 mg  480 mg Oral Daily Clovis Fredrickson, MD   480 mg at 12/04/15 0849  . ziprasidone (GEODON) capsule 80 mg  80 mg Oral Q supper Clovis Fredrickson, MD   80 mg at 12/03/15 2125    Lab Results:  Results for orders placed or performed during the hospital encounter of 12/01/15 (from the past 48 hour(s))  Glucose, capillary     Status: None   Collection Time: 12/02/15  4:46 PM  Result Value Ref Range   Glucose-Capillary 88 65 - 99 mg/dL  Glucose, capillary     Status: None   Collection Time: 12/02/15  8:41 PM  Result Value Ref Range   Glucose-Capillary 82 65 - 99 mg/dL   Comment 1 Notify RN   Glucose, capillary     Status: Abnormal   Collection Time: 12/03/15  6:41 AM  Result Value Ref Range   Glucose-Capillary 100 (H) 65 - 99 mg/dL   Comment 1 Notify RN   Glucose, capillary     Status: Abnormal   Collection Time: 12/03/15 12:05 PM  Result Value Ref Range   Glucose-Capillary 113 (H) 65 - 99 mg/dL  Glucose, capillary     Status: None   Collection Time: 12/03/15  4:57 PM  Result Value Ref Range   Glucose-Capillary 88 65 - 99  mg/dL  Glucose, capillary     Status: None   Collection Time: 12/03/15  8:53 PM  Result Value Ref Range   Glucose-Capillary 93 65 - 99 mg/dL  Lipid panel     Status: None   Collection Time: 12/04/15  6:35 AM  Result Value Ref Range    Cholesterol 135 0 - 200 mg/dL   Triglycerides 90 <150 mg/dL   HDL 55 >40 mg/dL   Total CHOL/HDL Ratio 2.5 RATIO   VLDL 18 0 - 40 mg/dL   LDL Cholesterol 62 0 - 99 mg/dL    Comment:        Total Cholesterol/HDL:CHD Risk Coronary Heart Disease Risk Table                     Men   Women  1/2 Average Risk   3.4   3.3  Average Risk       5.0   4.4  2 X Average Risk   9.6   7.1  3 X Average Risk  23.4   11.0        Use the calculated Patient Ratio above and the CHD Risk Table to determine the patient's CHD Risk.        ATP III CLASSIFICATION (LDL):  <100     mg/dL   Optimal  100-129  mg/dL   Near or Above                    Optimal  130-159  mg/dL   Borderline  160-189  mg/dL   High  >190     mg/dL   Very High   Glucose, capillary     Status: None   Collection Time: 12/04/15 12:08 PM  Result Value Ref Range   Glucose-Capillary 76 65 - 99 mg/dL    Blood Alcohol level:  Lab Results  Component Value Date   ETH <5 11/24/2015   ETH <5 98/33/8250    Metabolic Disorder Labs: Lab Results  Component Value Date   HGBA1C 5.6 11/25/2015   MPG 114 11/25/2015   MPG 123 (H) 04/24/2011   No results found for: PROLACTIN Lab Results  Component Value Date   CHOL 135 12/04/2015   TRIG 90 12/04/2015   HDL 55 12/04/2015   CHOLHDL 2.5 12/04/2015   VLDL 18 12/04/2015   LDLCALC 62 12/04/2015   LDLCALC 54 07/15/2013    Physical Findings: AIMS:  , ,  ,  ,    CIWA:    COWS:     Musculoskeletal: Strength & Muscle Tone: within normal limits Gait & Station: normal Patient leans: N/A  Psychiatric Specialty Exam: Physical Exam  Nursing note and vitals reviewed.   Review of Systems  Respiratory: Negative for shortness of breath.   Psychiatric/Behavioral: Positive for depression and substance abuse. Negative for suicidal ideas. The patient has insomnia.   All other systems reviewed and are negative.   Blood pressure (!) 136/93, pulse 69, temperature 98.5 F (36.9 C), resp. rate  18, height 5\' 4"  (1.626 m), weight 108.9 kg (240 lb), SpO2 99 %.Body mass index is 41.2 kg/m.  General Appearance: Casual  Eye Contact:  Good  Speech:  Clear and Coherent  Volume:  Normal  Mood:  Anxious and Depressed  Affect:  Appropriate  Thought Process:  Goal Directed and Descriptions of Associations: Intact  Orientation:  Full (Time, Place, and Person)  Thought Content:  WDL  Suicidal Thoughts:  No  Homicidal Thoughts:  No  Memory:  Immediate;   Fair Recent;   Fair Remote;   Fair  Judgement:  Impaired  Insight:  Lacking  Psychomotor Activity:  Normal  Concentration:  Concentration: Fair and Attention Span: Fair  Recall:  [employer] of Knowledge:  Fair  Language:  Fair  Akathisia:  No  Handed:  Right  AIMS (if indicated):     Assets:  Communication Skills Desire for Improvement Financial Resources/Insurance Resilience Social Support  ADL's:  Intact  Cognition:  WNL  Sleep:  Number of Hours: 5.75     Treatment Plan Summary: Daily contact with patient to assess and evaluate symptoms and progress in treatment and Medication management   Tiffany [name] is a 55 year old female with history of depression, mood instability, and cocaine addiction transferred from [facility] for suicidal and homicidal ideation.  1. Suicidal and homicidal ideation. The patient is able to contract for safety in the hospital.  2. Mood and psychosis. The patient has been maintained on a combination of Lexapro 40 mg, Geodon 160 mg, Remeron 30 mg for bipolar illness. During her hospitalization her QTC was elevated and she presented with hyponatremia. As a result her Remeron was discontinued, and doses of Geodon and Lexapro were cut in half.   3. Insomnia. The patient insists to be back on Remeron that helps her sleep. EKG today shows borderline prolonged QT. We will continue daily EKG monitoring.   4. COPD. [name] on inhalers and CPAP at night.   5. Hypertension. [name] on  amlodipine, aspirin, furosemide, lisinopril, verapamil, and potassium.  6. Dyslipidemia. [name] on Lipitor.  7. Diabetic neuropathy. [name] on Neurontin.  8. Diabetes. [name] on metoprolol, ADA diet, and sliding scale insulin with blood glucose monitoring.  9. GERD. [name] on Protonix.  10. Smoking. [name] on nicotine patch.  11. Substance abuse. The patient is addicted to crack. She tried outpatient treatment with no affect. She expects [name] to place her in the residential treatment program.  12. Metabolic syndrome monitoring. Lipid profile, hemoglobin A1c and TSH are pending.  13. EPS. We added amantadine.   14. Disposition. She will be discharged to [facility] residential treatment program on Monday.   Patient is reporting she has not been sleeping well. [name] requesting to have increment on the Remeron. I have reviewed the record and she had  QTC prolongation. For now we will maintain the medications at the same dose.  Hildred Priest, MD 12/04/2015, 2:53 PM

## 2015-12-04 NOTE — B[facility] Group Notes (Signed)
[place] LCSW Group Therapy  12/04/2015 3:51 PM  Type of Therapy:  Group Therapy  Participation Level:  Minimal  Participation Quality:  Attentive  Affect:  Appropriate  Cognitive:  Alert  Insight:  None  Engagement in Therapy:  Limited  Modes of Intervention:  Activity, Discussion, Education and Support  Summary of Progress/Problems:Self esteem: Patients discussed self esteem and how it impacts them. They discussed what aspects in their lives has influenced their self esteem. They were challenged to identify changes that are needed in order to improve self esteem. Patients participated in activity where they had to identify positive adjectives they felt described their personality. Patients shared with the group on the following areas: Things I am good at, What I like about my appearance, I've helped others by, What I value the most, compliments I have received, challenges I have overcome, thing that make me unique, and Times I've made others happy. Patients also participated in positive socialization with a group game of "Jenga". Patient chose not to participate in the group activity but engage appropriately with her peers.   Tiffany [name], [place] 12/04/2015, 3:53 PM

## 2015-12-05 LAB — GLUCOSE, CAPILLARY
Glucose-Capillary: 80 mg/dL (ref 65–99)
Glucose-Capillary: 99 mg/dL (ref 65–99)
Glucose-Capillary: 88 mg/dL (ref 65–99)
Glucose-Capillary: 110 mg/dL — ABNORMAL HIGH (ref 65–99)

## 2015-12-05 LAB — THYROID PANEL WITH TSH
Free Thyroxine Index: 1.7 (ref 1.2–4.9)
T3 Uptake Ratio: 29 % (ref 24–39)
T4, Total: 6 ug/dL (ref 4.5–12.0)
TSH: 1.06 u[IU]/mL (ref 0.450–4.500)

## 2015-12-05 MED ORDER — ESCITALOPRAM OXALATE 20 MG PO TABS
20.0000 mg | ORAL_TABLET | Freq: Every day | ORAL | 1 refills | Status: DC
Start: 2015-12-05 — End: 2015-12-20

## 2015-12-05 MED ORDER — FUROSEMIDE 40 MG PO TABS: 40.0000 mg | ORAL_TABLET | Freq: Every day | ORAL | 0 refills | Status: DC

## 2015-12-05 MED ORDER — ASPIRIN 81 MG PO TBEC: 81.0000 mg | DELAYED_RELEASE_TABLET | Freq: Every day | ORAL | 1 refills | Status: DC

## 2015-12-05 MED ORDER — OMEPRAZOLE 40 MG PO CPDR: 40.0000 mg | DELAYED_RELEASE_CAPSULE | Freq: Every day | ORAL | 1 refills | Status: DC

## 2015-12-05 MED ORDER — MONTELUKAST SODIUM 10 MG PO TABS: 10.0000 mg | ORAL_TABLET | Freq: Every day | ORAL | 1 refills | Status: DC

## 2015-12-05 MED ORDER — ALBUTEROL SULFATE HFA 108 (90 BASE) MCG/ACT IN AERS: 2.0000 | INHALATION_SPRAY | Freq: Four times a day (QID) | RESPIRATORY_TRACT | 6 refills | Status: DC | PRN

## 2015-12-05 MED ORDER — MULTI-VITAMIN/MINERALS PO TABS: 1.0000 | ORAL_TABLET | Freq: Every day | ORAL | 1 refills | Status: DC

## 2015-12-05 MED ORDER — ACCU-CHEK SOFTCLIX LANCETS MISC: 12 refills | Status: DC

## 2015-12-05 MED ORDER — METFORMIN HCL ER 500 MG PO TB24: 500.0000 mg | ORAL_TABLET | Freq: Two times a day (BID) | ORAL | 1 refills | Status: DC

## 2015-12-05 MED ORDER — OXYBUTYNIN CHLORIDE 5 MG PO TABS: 5.0000 mg | ORAL_TABLET | Freq: Three times a day (TID) | ORAL | 1 refills | Status: DC

## 2015-12-05 MED ORDER — GLUCOSE BLOOD VI STRP: ORAL_STRIP | 12 refills | Status: DC

## 2015-12-05 MED ORDER — AMLODIPINE BESYLATE 5 MG PO TABS: 10.0000 mg | ORAL_TABLET | Freq: Every day | ORAL | 1 refills | Status: DC

## 2015-12-05 MED ORDER — POTASSIUM CHLORIDE CRYS ER 10 MEQ PO TBCR: 10.0000 meq | EXTENDED_RELEASE_TABLET | Freq: Two times a day (BID) | ORAL | 1 refills | Status: DC

## 2015-12-05 MED ORDER — FLUTICASONE FUROATE-VILANTEROL 100-25 MCG/INH IN AEPB: INHALATION_SPRAY | RESPIRATORY_TRACT | 0 refills | Status: DC

## 2015-12-05 MED ORDER — ZIPRASIDONE HCL 80 MG PO CAPS: 80.0000 mg | ORAL_CAPSULE | Freq: Every day | ORAL | 1 refills | Status: DC

## 2015-12-05 MED ORDER — TIOTROPIUM BROMIDE MONOHYDRATE 18 MCG IN CAPS: 18.0000 ug | ORAL_CAPSULE | Freq: Every day | RESPIRATORY_TRACT | 1 refills | Status: DC

## 2015-12-05 MED ORDER — MIRTAZAPINE 30 MG PO TABS: 30.0000 mg | ORAL_TABLET | Freq: Every day | ORAL | 1 refills | Status: DC

## 2015-12-05 MED ORDER — NAPROXEN 500 MG PO TABS: 500.0000 mg | ORAL_TABLET | Freq: Three times a day (TID) | ORAL | 1 refills | Status: DC | PRN

## 2015-12-05 MED ORDER — VERAPAMIL HCL ER 240 MG PO CP24: 480.0000 mg | ORAL_CAPSULE | Freq: Every day | ORAL | 0 refills | Status: DC

## 2015-12-05 MED ORDER — LISINOPRIL 40 MG PO TABS: 40.0000 mg | ORAL_TABLET | Freq: Every day | ORAL | 1 refills | Status: DC

## 2015-12-05 MED ORDER — AMANTADINE HCL 100 MG PO CAPS: 100.0000 mg | ORAL_CAPSULE | Freq: Two times a day (BID) | ORAL | 1 refills | Status: DC

## 2015-12-05 MED ORDER — GABAPENTIN 600 MG PO TABS: 600.0000 mg | ORAL_TABLET | Freq: Three times a day (TID) | ORAL | 1 refills | Status: DC

## 2015-12-05 MED ORDER — ATORVASTATIN CALCIUM 40 MG PO TABS: 40.0000 mg | ORAL_TABLET | Freq: Every day | ORAL | 1 refills | Status: DC

## 2015-12-05 NOTE — Progress Notes (Signed)
Patient ID: Tiffany [name], female   DOB: [birth year], 55 y.o.   MRN: [id number]  [name] faxed [facility] [place] referral for patient. Transmission Log received that fax was successfully sent.   Gwendalyn Mcgonagle G. [place], [place] 12/05/2015 1:45 PM

## 2015-12-05 NOTE — BHH Group Notes (Signed)
[place] LCSW Group Therapy  12/05/2015 1:07 PM  Type of Therapy:  Group Therapy  Participation Level:  Minimal  Participation Quality:  Attentive  Affect:  Appropriate  Cognitive:  Alert  Insight:  Limited  Engagement in Therapy:  Limited  Modes of Intervention:  Activity, Discussion, Education, Art therapist, Socialization and Support  Summary of Progress/Problems:Safety Planning: Patients identified fears or worries surrounding discharge. Patients offered support to their peers and openly developed safety plans for their individual needs. Patients developed their own safety plan. Patients discussed their warning signs, coping strategies, support system with family and friends, identified mental health professionals, and how to keep their environments safe (ex. Removing unnecessary medications or removing weapons/guns). Patients then discussed their personalized safety plan with the group.  [name] Valdes G. [place], [place] 12/05/2015, 1:09 PM

## 2015-12-05 NOTE — Progress Notes (Signed)
[facility] MD Progress Note  12/05/2015 2:50 PM Tiffany [name]  MRN:  [id number]  Subjective:  Tiffany [name] is a 55 year old female with a history of schizoaffective disorder and cocaine dependence transferred from [place] floor where she was hospitalized for medical consequences of cocaine binge. She had hyponatremia and QTC prolongation. They discontinued Remeron and decreased dose of Celexa and Geodon by 50%. Sodium level and EKG normalized. Upon her request, I restarted Remeron for sleep and her QT is 440 today. We continue daily EKGs.  Tiffany [name] slept well last night, feels much reinvigorated and ready to address her cocaine addiction. She will be discharged on Monday to participate in [facility] residential program. Her mood has improved and she is much more hopeful. She denies any physical symptoms. Good group participation.   11/18 patient denies suicidality, homicidality or auditory or visual hallucinations. She describes her mood as good. She denies any physical complaints. She states that her main issue today is lack of sleep. Looks like per nursing notes she slept 5 hours.  [name] requesting to increase the mirtazapine.   11/19 patient says that she has been having very few good thoughts about the places where she used drugs and she also sees the faces of the people that she was doing drugs. [name] unable to explain to me how it is affecting her. Patient says she tries to not think about those things by praying and reading but sometimes when she is alone in her room she cannot stop thinking about the faces of these people and the places. She says she's been told she had obsessive-compulsive disorder in the past. She denies any compulsive behaviors.   Patient did not have a very good understanding as to why she is getting EKGs every day. As we talk about the QTC prolongation. She understood the reason why medications have not been changed or increased.   Patient denies suicidality,  homicidality or auditory or visual hallucinations.   Per nursing: Patient is alert and oriented on the unit this shift. Patient not attended and actively participated in groups today. Patient denies suicidal ideation, homicidal ideation, auditory or visual hallucinations at the present time.   Principal Problem: Bipolar affective disorder, mixed, severe, with psychotic behavior ([place]) Diagnosis:   Patient Active Problem List   Diagnosis Date Noted  . Bipolar affective disorder, mixed, severe, with psychotic behavior ([place]) [F31.64] 11/28/2015  . Cocaine use disorder, severe, dependence ([place]) [F14.20] 11/28/2015  . Hyponatremia [E87.1] 11/25/2015  . AKI (acute kidney injury) ([place]) [N17.9] 11/24/2015  . Blood in sputum [R04.2]   . Acute on chronic congestive heart failure ([place]) [I50.9]   . SOB (shortness of breath) [R06.02]   . Hypoxia [R09.02] 08/19/2015  . COPD with acute exacerbation ([place]) [J44.1] 08/19/2015  . Acute on chronic respiratory failure with hypoxia and hypercapnia (HCC) [J57.01, J96.22] 06/22/2015  . Acute respiratory failure with hypoxemia ([place]) [J96.01] 06/22/2015  . Scabies [B86] 06/01/2015  . Onychomycosis [B35.1] 01/21/2015  . Nasal congestion [R09.81] 12/17/2014  . Tobacco use disorder [F17.200] 07/22/2014  . COPD (chronic obstructive pulmonary disease) ([place]) [J44.9] 07/08/2014  . COPD exacerbation ([place]) [J44.1] 06/26/2014  . Seborrheic keratoses [L82.1] 12/31/2013  . Thrush [B37.0] 09/19/2013  . Knee pain, bilateral [M25.561, M25.562] 01/22/2013  . Seizure ([place]) [R56.9] 01/04/2013  . Health care maintenance [Z00.00] 11/25/2012  . Folliculitis [X79.3] 90/30/0923  . History of kidney stones [Z87.442] 06/18/2012  . Chronic pain syndrome [G89.4] 06/18/2012  . Muscle spasm [M62.838] 06/18/2012  .  Elevated troponin [R74.8] 04/28/2012  . Asthma with acute exacerbation [J45.901] 04/28/2012  . Allergic reaction [T78.40XA] 04/07/2012  . Itching [L29.9] 09/06/2011  . HSV  infection [B00.9] 08/30/2011  . Dyslipidemia [E78.5] 04/24/2011  . Anemia [D64.9] 04/24/2011  . Diabetic neuropathy ([place]) [E11.40] 04/24/2011  . Obstructive sleep apnea [G47.33] 10/18/2010  . Asthma [493] 10/18/2010  . Obesity [E66.9] 10/18/2010  . Type 2 diabetes mellitus ([place]) [E11.9] 10/18/2010   Total Time spent with patient: 20 minutes  Past Psychiatric History: schizoaffective disorder.  Past Medical History:  Past Medical History:  Diagnosis Date  . Anxiety   . Asthma   . Cocaine abuse   . Complication of anesthesia    decreased bp, decreased heart rate  . Diabetes mellitus    class 2  . Disorder of nervous system   . Emphysema   . Heart attack   . High blood pressure   . High cholesterol   . Hypertension   . Incontinence   . Manic depression ([place])   . Myocardial infarct   . Pneumonia 06/2015  . Seasonal allergies   . Seizures ([place])   . Sinus trouble   . Sleep apnea   . Stroke [facility])     Past Surgical History:  Procedure Laterality Date  . ABDOMINAL HYSTERECTOMY    . CESAREAN SECTION    . HIATAL HERNIA REPAIR     Family History:  Family History  Problem Relation Age of Onset  . Cancer Father     prostate  . Cancer Mother     lung  . Depression Mother   . Depression Sister   . Anxiety disorder Sister   . Schizophrenia Sister   . Bipolar disorder Sister   . Depression Sister   . Depression Brother   . Heart failure      cousin   Family Psychiatric  See H&P. Social History:  History  Alcohol Use No     History  Drug Use  . Types: Cocaine    Social History   Social History  . Marital status: Widowed    Spouse name: N/A  . Number of children: 3  . Years of education: N/A   Occupational History  . disabled     factory production   Social History Main Topics  . Smoking status: Current Every Day Smoker    Packs/day: 0.50    Years: 37.00    Types: Cigarettes    Start date: 01/16/1977  . Smokeless tobacco: Never Used     Comment:  Previous 2 PPD menthol full flavor  . Alcohol use No  . Drug use:     Types: Cocaine  . Sexual activity: No   Other Topics Concern  . None   Social History Narrative  . None     Current Medications: Current Facility-Administered Medications  Medication Dose Route Frequency Provider Last Rate Last Dose  . acetaminophen (TYLENOL) tablet 650 mg  650 mg Oral Q6H PRN Clovis Fredrickson, MD   650 mg at 12/02/15 0808  . albuterol (PROVENTIL HFA;VENTOLIN HFA) 108 (90 Base) MCG/ACT inhaler 1-2 puff  1-2 puff Inhalation Q6H PRN Clovis Fredrickson, MD   2 puff at 12/05/15 0807  . alum & mag hydroxide-simeth (MAALOX/MYLANTA) 200-200-20 MG/5ML suspension 30 mL  30 mL Oral Q4H PRN Jolanta B Pucilowska, MD      . amantadine (SYMMETREL) capsule 100 mg  100 mg Oral BID Clovis Fredrickson, MD   100 mg at 12/05/15 0809  .  amLODipine (NORVASC) tablet 5 mg  5 mg Oral Daily Jolanta B Pucilowska, MD   5 mg at 12/05/15 0809  . aspirin chewable tablet 81 mg  81 mg Oral Daily Jolanta B Pucilowska, MD   81 mg at 12/05/15 0809  . atorvastatin (LIPITOR) tablet 40 mg  40 mg Oral q1800 Jolanta B Pucilowska, MD   40 mg at 12/04/15 1742  . escitalopram (LEXAPRO) tablet 20 mg  20 mg Oral Daily Jolanta B Pucilowska, MD   20 mg at 12/05/15 0809  . fluticasone furoate-vilanterol (BREO ELLIPTA) 100-25 MCG/INH 1 puff  1 puff Inhalation Daily Clovis Fredrickson, MD   1 puff at 12/05/15 0808  . furosemide (LASIX) tablet 40 mg  40 mg Oral Daily Jolanta B Pucilowska, MD   40 mg at 12/05/15 0809  . gabapentin (NEURONTIN) capsule 600 mg  600 mg Oral QHS Clovis Fredrickson, MD   600 mg at 12/04/15 2133  . insulin aspart (novoLOG) injection 0-20 Units  0-20 Units Subcutaneous TID WC Jolanta B Pucilowska, MD      . insulin aspart (novoLOG) injection 0-5 Units  0-5 Units Subcutaneous QHS Jolanta B Pucilowska, MD      . lisinopril (PRINIVIL,ZESTRIL) tablet 40 mg  40 mg Oral Daily Jolanta B Pucilowska, MD   40 mg at 12/05/15  0809  . magnesium hydroxide (MILK OF MAGNESIA) suspension 30 mL  30 mL Oral Daily PRN Jolanta B Pucilowska, MD      . metFORMIN (GLUCOPHAGE) tablet 500 mg  500 mg Oral BID WC Jolanta B Pucilowska, MD   500 mg at 12/05/15 0809  . mirtazapine (REMERON) tablet 30 mg  30 mg Oral QHS Hildred Priest, MD   30 mg at 12/04/15 2133  . montelukast (SINGULAIR) tablet 10 mg  10 mg Oral QHS Jolanta B Pucilowska, MD   10 mg at 12/04/15 2133  . naproxen (NAPROSYN) tablet 250 mg  250 mg Oral BID WC Hildred Priest, MD   250 mg at 12/05/15 0809  . neomycin-bacitracin-polymyxin (NEOSPORIN) ointment   Topical BID Clovis Fredrickson, MD   1 application at 40/08/67 0808  . nicotine (NICODERM CQ - dosed in mg/24 hours) patch 21 mg  21 mg Transdermal Q0600 Clovis Fredrickson, MD   21 mg at 12/05/15 1207  . pantoprazole (PROTONIX) EC tablet 40 mg  40 mg Oral Daily Jolanta B Pucilowska, MD   40 mg at 12/05/15 0809  . potassium chloride (K-DUR,KLOR-CON) CR tablet 10 mEq  10 mEq Oral Daily Clovis Fredrickson, MD   10 mEq at 12/05/15 0809  . tiotropium (SPIRIVA) inhalation capsule 18 mcg  18 mcg Inhalation Daily Clovis Fredrickson, MD   18 mcg at 12/05/15 0808  . verapamil (CALAN-SR) CR tablet 480 mg  480 mg Oral Daily Clovis Fredrickson, MD   480 mg at 12/05/15 0809  . ziprasidone (GEODON) capsule 80 mg  80 mg Oral Q supper Clovis Fredrickson, MD   80 mg at 12/04/15 1649    Lab Results:  Results for orders placed or performed during the hospital encounter of 12/01/15 (from the past 48 hour(s))  Glucose, capillary     Status: None   Collection Time: 12/03/15  4:57 PM  Result Value Ref Range   Glucose-Capillary 88 65 - 99 mg/dL  Glucose, capillary     Status: None   Collection Time: 12/03/15  8:53 PM  Result Value Ref Range   Glucose-Capillary 93 65 - 99 mg/dL  Lipid  panel     Status: None   Collection Time: 12/04/15  6:35 AM  Result Value Ref Range   Cholesterol 135 0 - 200 mg/dL    Triglycerides 90 <150 mg/dL   HDL 55 >40 mg/dL   Total CHOL/HDL Ratio 2.5 RATIO   VLDL 18 0 - 40 mg/dL   LDL Cholesterol 62 0 - 99 mg/dL    Comment:        Total Cholesterol/HDL:CHD Risk Coronary Heart Disease Risk Table                     Men   Women  1/2 Average Risk   3.4   3.3  Average Risk       5.0   4.4  2 X Average Risk   9.6   7.1  3 X Average Risk  23.4   11.0        Use the calculated Patient Ratio above and the CHD Risk Table to determine the patient's CHD Risk.        ATP III CLASSIFICATION (LDL):  <100     mg/dL   Optimal  100-129  mg/dL   Near or Above                    Optimal  130-159  mg/dL   Borderline  160-189  mg/dL   High  >190     mg/dL   Very High   Thyroid Panel With TSH     Status: None   Collection Time: 12/04/15  6:35 AM  Result Value Ref Range   TSH 1.060 0.450 - 4.500 uIU/mL   T4, Total 6.0 4.5 - 12.0 ug/dL   T3 Uptake Ratio 29 24 - 39 %   Free Thyroxine Index 1.7 1.2 - 4.9    Comment: (NOTE) Performed At: [facility] [address] [place], [place] [id number] Lindon Romp MD YO:[id number]   Glucose, capillary     Status: None   Collection Time: 12/04/15 12:08 PM  Result Value Ref Range   Glucose-Capillary 76 65 - 99 mg/dL  Glucose, capillary     Status: None   Collection Time: 12/04/15  4:45 PM  Result Value Ref Range   Glucose-Capillary 91 65 - 99 mg/dL   Comment 1 Notify RN    Comment 2 Document in Chart   Glucose, capillary     Status: None   Collection Time: 12/04/15  9:38 PM  Result Value Ref Range   Glucose-Capillary 98 65 - 99 mg/dL  Glucose, capillary     Status: None   Collection Time: 12/05/15  6:13 AM  Result Value Ref Range   Glucose-Capillary 80 65 - 99 mg/dL  Glucose, capillary     Status: None   Collection Time: 12/05/15 12:09 PM  Result Value Ref Range   Glucose-Capillary 99 65 - 99 mg/dL    Blood Alcohol level:  Lab Results  Component Value Date   ETH <5 11/24/2015   ETH <5 74/12/8784     Metabolic Disorder Labs: Lab Results  Component Value Date   HGBA1C 5.6 11/25/2015   MPG 114 11/25/2015   MPG 123 (H) 04/24/2011   No results found for: PROLACTIN Lab Results  Component Value Date   CHOL 135 12/04/2015   TRIG 90 12/04/2015   HDL 55 12/04/2015   CHOLHDL 2.5 12/04/2015   VLDL 18 12/04/2015   LDLCALC 62 12/04/2015   LDLCALC 54 07/15/2013  Physical Findings: AIMS:  , ,  ,  ,    CIWA:    COWS:     Musculoskeletal: Strength & Muscle Tone: within normal limits Gait & Station: normal Patient leans: N/A  Psychiatric Specialty Exam: Physical Exam  Nursing note and vitals reviewed.   Review of Systems  Respiratory: Negative for shortness of breath.   Psychiatric/Behavioral: Positive for depression and substance abuse. Negative for suicidal ideas. The patient has insomnia.   All other systems reviewed and are negative.   Blood pressure (!) 143/80, pulse 72, temperature 99.1 F (37.3 C), temperature source Oral, resp. rate 18, height 5\' 4"  (1.626 m), weight 108.9 kg (240 lb), SpO2 99 %.Body mass index is 41.2 kg/m.  General Appearance: Casual  Eye Contact:  Good  Speech:  Clear and Coherent  Volume:  Normal  Mood:  Anxious and Depressed  Affect:  Appropriate  Thought Process:  Goal Directed and Descriptions of Associations: Intact  Orientation:  Full (Time, Place, and Person)  Thought Content:  WDL  Suicidal Thoughts:  No  Homicidal Thoughts:  No  Memory:  Immediate;   Fair Recent;   Fair Remote;   Fair  Judgement:  Impaired  Insight:  Lacking  Psychomotor Activity:  Normal  Concentration:  Concentration: Fair and Attention Span: Fair  Recall:  [employer] of Knowledge:  Fair  Language:  Fair  Akathisia:  No  Handed:  Right  AIMS (if indicated):     Assets:  Communication Skills Desire for Improvement Financial Resources/Insurance Resilience Social Support  ADL's:  Intact  Cognition:  WNL  Sleep:  Number of Hours: 6.15      Treatment Plan Summary: Daily contact with patient to assess and evaluate symptoms and progress in treatment and Medication management   Tiffany [name] is a 55 year old female with history of depression, mood instability, and cocaine addiction transferred from [facility] for suicidal and homicidal ideation.  1. Suicidal and homicidal ideation. The patient is able to contract for safety in the hospital.  2. Mood and psychosis. The patient has been maintained on a combination of Lexapro 40 mg, Geodon 160 mg, Remeron 30 mg for bipolar illness. During her hospitalization her QTC was elevated and she presented with hyponatremia. As a result her Remeron was discontinued, and doses of Geodon and Lexapro were cut in half.   3. Insomnia. The patient insists to be back on Remeron that helps her sleep. EKG today shows borderline prolonged QT. We will continue daily EKG monitoring.   4. COPD. [name] on inhalers and CPAP at night.   5. Hypertension. [name] on amlodipine, aspirin, furosemide, lisinopril, verapamil, and potassium.  6. Dyslipidemia. [name] on Lipitor.  7. Diabetic neuropathy. [name] on Neurontin.  8. Diabetes. [name] on metoprolol, ADA diet, and sliding scale insulin with blood glucose monitoring.  9. GERD. [name] on Protonix.  10. Smoking. [name] on nicotine patch.  11. Substance abuse. The patient is addicted to crack. She tried outpatient treatment with no affect. She expects [name] to place her in the residential treatment program.  12. Metabolic syndrome monitoring. Lipid profile, hemoglobin A1c and TSH are pending.  13. EPS. We added amantadine.   14. Disposition. She will be discharged to [facility] residential treatment program on Monday.   11/18 Patient is reporting she has not been sleeping well. [name] requesting to have increment on the Remeron. I have reviewed the record and she had  QTC prolongation. For  now we will maintain the medications at the  same dose.  11/19 patient reports having some intrusive thoughts. She tells me she sees the places where she used drugs and see the faces of the people she uses drugs with. She says she is trying to block the thoughts by praying and reading but sometimes when she is alone in her room she can stop the thoughts. It was difficult to understand how this is affecting her emotionally. I asked in multiple different ways for how thinking about these make her feel that the patient had a great difficulty describing her emotions. For now no medication changes will be made. She'll be continued on Remeron, Geodon and Lexapro  QTC today 450----improving everyday  Hildred Priest, MD 12/05/2015, 2:50 PM

## 2015-12-05 NOTE — Plan of Care (Signed)
Problem: Medication: Goal: Compliance with prescribed medication regimen will improve Outcome: Progressing Patient is compliant with mediation regimen

## 2015-12-05 NOTE — Progress Notes (Signed)
D: Patient is alert and oriented on the unit this shift. Patient not attended and actively participated in groups today. Patient denies suicidal ideation, homicidal ideation, auditory or visual hallucinations at the present time.  A: Scheduled medications are administered to patient as per MD orders. Emotional support and encouragement are provided. Patient is maintained on q.15 minute safety checks. Patient is informed to notify staff with questions or concerns. R: No adverse medication reactions are noted. Patient is cooperative with medication administration and treatment plan today. Patient is receptive depressed, calm and cooperative on the unit at this time. Patient does not interact  with others on the unit this shift. Patient contracts for safety at this time. Patient remains safe at this time.

## 2015-12-05 NOTE — Plan of Care (Signed)
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

## 2015-12-05 NOTE — Progress Notes (Signed)
[]  Hover for attribution information Patient has been very pleasant today, mostly in her room. Is A&O. Steady gait. Preparing for discharge.States she slept good and not having any issues. Medication complaintHas attended groups and interacting with peers. Q15 minute checks maintained for safety. Will continue to monitor.

## 2015-12-06 LAB — GLUCOSE, CAPILLARY
Glucose-Capillary: 87 mg/dL (ref 65–99)
Glucose-Capillary: 84 mg/dL (ref 65–99)

## 2015-12-06 NOTE — Tx Team (Signed)
Interdisciplinary Treatment and Diagnostic Plan Update  12/06/2015 Time of Session: 10:30am Tiffany [name] MRN: [id number]  Principal Diagnosis: Bipolar affective disorder, mixed, severe, with psychotic behavior ([place])  Secondary Diagnoses: Principal Problem:   Bipolar affective disorder, mixed, severe, with psychotic behavior ([place]) Active Problems:   Obesity   Type 2 diabetes mellitus ([place])   Dyslipidemia   COPD (chronic obstructive pulmonary disease) ([place])   Tobacco use disorder   Cocaine use disorder, severe, dependence ([place])   Current Medications:  Current Facility-Administered Medications  Medication Dose Route Frequency Provider Last Rate Last Dose  . acetaminophen (TYLENOL) tablet 650 mg  650 mg Oral Q6H PRN Clovis Fredrickson, MD   650 mg at 12/02/15 0808  . albuterol (PROVENTIL HFA;VENTOLIN HFA) 108 (90 Base) MCG/ACT inhaler 1-2 puff  1-2 puff Inhalation Q6H PRN Clovis Fredrickson, MD   2 puff at 12/06/15 0816  . alum & mag hydroxide-simeth (MAALOX/MYLANTA) 200-200-20 MG/5ML suspension 30 mL  30 mL Oral Q4H PRN Jolanta B Pucilowska, MD      . amantadine (SYMMETREL) capsule 100 mg  100 mg Oral BID Clovis Fredrickson, MD   100 mg at 12/06/15 0814  . amLODipine (NORVASC) tablet 5 mg  5 mg Oral Daily Clovis Fredrickson, MD   5 mg at 12/06/15 0814  . aspirin chewable tablet 81 mg  81 mg Oral Daily Clovis Fredrickson, MD   81 mg at 12/06/15 0814  . atorvastatin (LIPITOR) tablet 40 mg  40 mg Oral q1800 Jolanta B Pucilowska, MD   40 mg at 12/05/15 1759  . escitalopram (LEXAPRO) tablet 20 mg  20 mg Oral Daily Jolanta B Pucilowska, MD   20 mg at 12/06/15 0813  . fluticasone furoate-vilanterol (BREO ELLIPTA) 100-25 MCG/INH 1 puff  1 puff Inhalation Daily Jolanta B Pucilowska, MD   1 puff at 12/06/15 0815  . furosemide (LASIX) tablet 40 mg  40 mg Oral Daily Jolanta B Pucilowska, MD   40 mg at 12/06/15 0814  . gabapentin (NEURONTIN) capsule 600 mg  600 mg Oral QHS Clovis Fredrickson, MD   600 mg at 12/05/15 2201  . insulin aspart (novoLOG) injection 0-20 Units  0-20 Units Subcutaneous TID WC Jolanta B Pucilowska, MD      . insulin aspart (novoLOG) injection 0-5 Units  0-5 Units Subcutaneous QHS Jolanta B Pucilowska, MD      . lisinopril (PRINIVIL,ZESTRIL) tablet 40 mg  40 mg Oral Daily Jolanta B Pucilowska, MD   40 mg at 12/06/15 0813  . magnesium hydroxide (MILK OF MAGNESIA) suspension 30 mL  30 mL Oral Daily PRN Jolanta B Pucilowska, MD      . metFORMIN (GLUCOPHAGE) tablet 500 mg  500 mg Oral BID WC Jolanta B Pucilowska, MD   500 mg at 12/06/15 0813  . mirtazapine (REMERON) tablet 30 mg  30 mg Oral QHS Hildred Priest, MD   30 mg at 12/05/15 2201  . montelukast (SINGULAIR) tablet 10 mg  10 mg Oral QHS Jolanta B Pucilowska, MD   10 mg at 12/05/15 2202  . naproxen (NAPROSYN) tablet 250 mg  250 mg Oral BID WC Hildred Priest, MD   250 mg at 12/06/15 0813  . neomycin-bacitracin-polymyxin (NEOSPORIN) ointment   Topical BID Clovis Fredrickson, MD   1 application at 94/17/40 0814  . nicotine (NICODERM CQ - dosed in mg/24 hours) patch 21 mg  21 mg Transdermal Q0600 Clovis Fredrickson, MD   21 mg at 12/06/15 0815  .  pantoprazole (PROTONIX) EC tablet 40 mg  40 mg Oral Daily Shari Prows, MD   40 mg at 12/06/15 0814  . potassium chloride (K-DUR,KLOR-CON) CR tablet 10 mEq  10 mEq Oral Daily Shari Prows, MD   10 mEq at 12/06/15 0819  . tiotropium (SPIRIVA) inhalation capsule 18 mcg  18 mcg Inhalation Daily Shari Prows, MD   18 mcg at 12/06/15 0814  . verapamil (CALAN-SR) CR tablet 480 mg  480 mg Oral Daily Jolanta B Pucilowska, MD   480 mg at 12/06/15 0813  . ziprasidone (GEODON) capsule 80 mg  80 mg Oral Q supper Shari Prows, MD   80 mg at 12/05/15 1633   Current Outpatient Prescriptions  Medication Sig Dispense Refill  . ACCU-CHEK SOFTCLIX LANCETS lancets Three times a day before meals. 100 each 12  . albuterol  (PROVENTIL HFA;VENTOLIN HFA) 108 (90 Base) MCG/ACT inhaler Inhale 2 puffs into the lungs every 6 (six) hours as needed. 1 Inhaler 6  . amantadine (SYMMETREL) 100 MG capsule Take 1 capsule (100 mg total) by mouth 2 (two) times daily. 60 capsule 1  . amLODipine (NORVASC) 5 MG tablet Take 2 tablets (10 mg total) by mouth daily. 60 tablet 1  . aspirin 81 MG EC tablet Take 1 tablet (81 mg total) by mouth daily. Swallow whole. 30 tablet 1  . atorvastatin (LIPITOR) 40 MG tablet Take 1 tablet (40 mg total) by mouth daily. 30 tablet 1  . Blood Glucose Monitoring Suppl (ACCU-CHEK AVIVA PLUS) w/Device KIT CHECK BLOOD SUGAR THREE TIMES DAILY 1 kit 0  . escitalopram (LEXAPRO) 20 MG tablet Take 1 tablet (20 mg total) by mouth daily. 30 tablet 1  . fluticasone furoate-vilanterol (BREO ELLIPTA) 100-25 MCG/INH AEPB INHALE 1 PUFF EVERY DAY 180 each 0  . furosemide (LASIX) 40 MG tablet Take 1 tablet (40 mg total) by mouth daily. Take Lasix twice daily from 7/14 until 7/18, then daily 30 tablet 0  . gabapentin (NEURONTIN) 600 MG tablet Take 1 tablet (600 mg total) by mouth 3 (three) times daily. 90 tablet 1  . glucose blood test strip Use to check blood sugar 3 times daily. 100 each 12  . lisinopril (PRINIVIL,ZESTRIL) 40 MG tablet Take 1 tablet (40 mg total) by mouth daily. 30 tablet 1  . metFORMIN (GLUCOPHAGE-XR) 500 MG 24 hr tablet Take 1 tablet (500 mg total) by mouth 2 (two) times daily. 60 tablet 1  . mirtazapine (REMERON) 30 MG tablet Take 1 tablet (30 mg total) by mouth at bedtime. 30 tablet 1  . montelukast (SINGULAIR) 10 MG tablet Take 1 tablet (10 mg total) by mouth at bedtime. 30 tablet 1  . Multiple Vitamins-Minerals (MULTIVITAMIN WITH MINERALS) tablet Take 1 tablet by mouth daily. Reported on 07/28/2015 30 tablet 1  . naproxen (NAPROSYN) 500 MG tablet Take 1 tablet (500 mg total) by mouth 3 (three) times daily as needed for mild pain. 90 tablet 1  . omeprazole (PRILOSEC) 40 MG capsule Take 1 capsule (40 mg  total) by mouth daily. 30 capsule 1  . oxybutynin (DITROPAN) 5 MG tablet Take 1 tablet (5 mg total) by mouth 3 (three) times daily. 90 tablet 1  . potassium chloride (K-DUR,KLOR-CON) 10 MEQ tablet Take 1 tablet (10 mEq total) by mouth 2 (two) times daily. 60 tablet 1  . tiotropium (SPIRIVA HANDIHALER) 18 MCG inhalation capsule Place 1 capsule (18 mcg total) into inhaler and inhale daily. 90 capsule 1  . verapamil (VERELAN PM) 240  MG 24 hr capsule Take 2 capsules (480 mg total) by mouth at bedtime. 60 capsule 0  . ziprasidone (GEODON) 80 MG capsule Take 1 capsule (80 mg total) by mouth at bedtime. 30 capsule 1   PTA Medications: No prescriptions prior to admission.    Patient Stressors: Health problems Substance abuse  Patient Strengths: Ability for insight Average or above average intelligence Capable of independent living Child psychotherapist Motivation for treatment/growth Supportive family/friends  Treatment Modalities: Medication Management, Group therapy, Case management,  1 to 1 session with clinician, Psychoeducation, Recreational therapy.   Physician Treatment Plan for Primary Diagnosis: Bipolar affective disorder, mixed, severe, with psychotic behavior ([place]) Long Term Goal(s): Improvement in symptoms so as ready for discharge Improvement in symptoms so as ready for discharge   Short Term Goals: Ability to identify changes in lifestyle to reduce recurrence of condition will improve Ability to verbalize feelings will improve Ability to disclose and discuss suicidal ideas Ability to demonstrate self-control will improve Ability to identify and develop effective coping behaviors will improve Ability to maintain clinical measurements within normal limits will improve Compliance with prescribed medications will improve Ability to identify changes in lifestyle to reduce recurrence of condition will improve Ability to demonstrate self-control will  improve Ability to identify triggers associated with substance abuse/mental health issues will improve  Medication Management: Evaluate patient's response, side effects, and tolerance of medication regimen.  Therapeutic Interventions: 1 to 1 sessions, Unit Group sessions and Medication administration.  Evaluation of Outcomes: Adequate for discharge  Physician Treatment Plan for Secondary Diagnosis: Principal Problem:   Bipolar affective disorder, mixed, severe, with psychotic behavior ([place]) Active Problems:   Obesity   Type 2 diabetes mellitus ([facility])   Dyslipidemia   COPD (chronic obstructive pulmonary disease) ([facility])   Tobacco use disorder   Cocaine use disorder, severe, dependence ([place])  Long Term Goal(s): Improvement in symptoms so as ready for discharge Improvement in symptoms so as ready for discharge   Short Term Goals: Ability to identify changes in lifestyle to reduce recurrence of condition will improve Ability to verbalize feelings will improve Ability to disclose and discuss suicidal ideas Ability to demonstrate self-control will improve Ability to identify and develop effective coping behaviors will improve Ability to maintain clinical measurements within normal limits will improve Compliance with prescribed medications will improve Ability to identify changes in lifestyle to reduce recurrence of condition will improve Ability to demonstrate self-control will improve Ability to identify triggers associated with substance abuse/mental health issues will improve     Medication Management: Evaluate patient's response, side effects, and tolerance of medication regimen.  Therapeutic Interventions: 1 to 1 sessions, Unit Group sessions and Medication administration.  Evaluation of Outcomes: Adequate for discharge   RN Treatment Plan for Primary Diagnosis: Bipolar affective disorder, mixed, severe, with psychotic behavior ([place]) Long Term Goal(s): Knowledge of disease and  therapeutic regimen to maintain health will improve  Short Term Goals: Ability to verbalize frustration and anger appropriately will improve, Ability to demonstrate self-control, Ability to verbalize feelings will improve, Ability to identify and develop effective coping behaviors will improve and Compliance with prescribed medications will improve  Medication Management: RN will administer medications as ordered by provider, will assess and evaluate patient's response and provide education to patient for prescribed medication. RN will report any adverse and/or side effects to prescribing provider.  Therapeutic Interventions: 1 on 1 counseling sessions, Psychoeducation, Medication administration, Evaluate responses to treatment, Monitor vital signs and CBGs as ordered, Perform/monitor CIWA,  COWS, AIMS and Fall Risk screenings as ordered, Perform wound care treatments as ordered.  Evaluation of Outcomes: Adequate for discharge  LCSW Treatment Plan for Primary Diagnosis: Bipolar affective disorder, mixed, severe, with psychotic behavior ([place]) Long Term Goal(s): Safe transition to appropriate next level of care at discharge, Engage patient in therapeutic group addressing interpersonal concerns.  Short Term Goals: Engage patient in aftercare planning with referrals and resources, Increase social support, Increase ability to appropriately verbalize feelings, Increase emotional regulation, Facilitate acceptance of mental health diagnosis and concerns and Increase skills for wellness and recovery  Therapeutic Interventions: Assess for all discharge needs, 1 to 1 time with Social worker, Explore available resources and support systems, Assess for adequacy in community support network, Educate family and significant other(s) on suicide prevention, Complete Psychosocial Assessment, Interpersonal group therapy.  Evaluation of Outcomes: Adequate for discharge   Progress in Treatment: Attending groups:  No. Participating in groups: No. Taking medication as prescribed: Yes. Toleration medication: Yes. Family/Significant other contact made: No, pt refused contact Patient understands diagnosis: Yes Discussing patient identified problems/goals with staff: Yes Medical problems stabilized or resolved: Yes Denies suicidal/homicidal ideation: No. Issues/concerns per patient self-inventory: No. Other: none  New problem(s) identified: No, Describe:  none reported  New Short Term/Long Term Goal(s):  Discharge Plan or Barriers: Pt will Pt will discharge home to [facility] to live with the pt's son and will [facility] for medication management, substance abuse treatment and therapy.    Reason for Continuation of Hospitalization: Anxiety Delusions  Depression Hallucinations Homicidal ideation Suicidal ideation Withdrawal symptoms  Estimated Length of Stay: Date of discharge: 11/2015   Attendees: Patient: Tiffany [name] 12/02/2015 10:56 AM  Physician: Dr. Bary Leriche, MD 12/02/2015 10:56 AM  Nursing: Polly Cobia, RN 12/02/2015 10:56 AM  RN Care Manager: 12/02/2015 10:56 AM  Social Worker: Alphonse Guild. Daine Gravel, LCAS 12/02/2015 10:56 AM  Recreational Therapist: Everitt Amber, LRT 12/02/2015 10:56 AM  Other:  12/02/2015 10:56 AM  Other:  12/02/2015 10:56 AM  Other: 12/02/2015 10:56 AM    Scribe for Treatment Team: Alphonse Guild Shams Fill, LCSWA 12/02/2015  Updated by Alphonse Guild. Surah Pelley, [place], LCAS 11/20

## 2015-12-06 NOTE — Discharge Summary (Signed)
Physician Discharge Summary Note  Patient:  Tiffany [name] is an 55 y.o., female MRN:  [id number] DOB:  December 09, 1960 Patient phone:  [phone] (home)  Patient address:   [address] [place] [address],  Total Time spent with patient: 30 minutes  Date of Admission:  12/01/2015 Date of Discharge: 12/06/2015  Reason for Admission:  Suicidal ideation.  Identifying data. [name] is a 55 year old female with history of depression, mood instability, and cocaine dependence.  Chief complaint. "I have a crack addiction."  History of present illness. Information was obtained from the patient and the chart. The patient has a long history of depression, psychosis, and mood instability. She has been maintained on a combination of Celexa, Remeron, and Geodon with excellent results. While on medication she does not experience any symptoms of depression, anxiety, or psychosis. Over the past 3 years, she relapsed on crack for 2 or 3 days at that time, sleeping around for drugs, and spending bills money on cocaine. She has not been consistent in taking her prescribed medication while binging. She was on a major binge when she developed Auditory command hallucinations telling her to kill people, suicidal ideation, and paranoia. She was admitted to medical floor at [facility]. Due to hyponatremia and an elongated QT her psychiatric medications were adjusted. She only takes half a dose of Celexa and Geodon. Remeron was discontinued completely. The patient was transferred to [facility] for further treatment and stabilization as she still voiced suicidal and homicidal ideation. Today on the interview the patient denies symptoms of depression or psychosis. She denies symptoms suggestive of bipolar. She does report passing suicidal ideation especially if inpatient substance abuse treatment is not available and she is to return to the streets of [place]. She reports some  symptoms of anxiety, especially when talking about her troubles. She noticed that her mouth would tremble and her hands would shake. She denies panic attacks, PTSD, or OCD symptoms. [name] a crack user but denies alcohol or other substance abuse.  Past psychiatric history. She has a long history of bipolar or schizoaffective disorder. There are several hospitalizations in the remote past. She does well on a combination of medicines. She denies ever attempting suicide. 30 years ago she went to [facility] for inpatient treatment for substance abuse and reports great success.  Family psychiatric history. Multiple family members with mental illness and addiction.  Social history. [name] disabled from mental illness and medical problems. She has a place. Her son, her cousin and the cousin's spouse live with her. The patient however frequently leaves the apartment and states that the crack house when binging.  Principal Problem: Bipolar affective disorder, mixed, severe, with psychotic behavior [facility]) Discharge Diagnoses: Patient Active Problem List   Diagnosis Date Noted  . Bipolar affective disorder, mixed, severe, with psychotic behavior ([place]) [F31.64] 11/28/2015  . Cocaine use disorder, severe, dependence ([place]) [F14.20] 11/28/2015  . Hyponatremia [E87.1] 11/25/2015  . AKI (acute kidney injury) ([place]) [N17.9] 11/24/2015  . Blood in sputum [R04.2]   . Acute on chronic congestive heart failure ([place]) [I50.9]   . SOB (shortness of breath) [R06.02]   . Hypoxia [R09.02] 08/19/2015  . COPD with acute exacerbation ([place]) [J44.1] 08/19/2015  . Acute on chronic respiratory failure with hypoxia and hypercapnia (HCC) [P29.51, J96.22] 06/22/2015  . Acute respiratory failure with hypoxemia ([place]) [J96.01] 06/22/2015  . Scabies [B86] 06/01/2015  . Onychomycosis [B35.1] 01/21/2015  . Nasal congestion [R09.81] 12/17/2014  .  Tobacco use disorder [F17.200] 07/22/2014  . COPD (chronic obstructive pulmonary  disease) ([place]) [J44.9] 07/08/2014  . COPD exacerbation ([place]) [J44.1] 06/26/2014  . Seborrheic keratoses [L82.1] 12/31/2013  . Thrush [B37.0] 09/19/2013  . Knee pain, bilateral [M25.561, M25.562] 01/22/2013  . Seizure ([place]) [R56.9] 01/04/2013  . Health care maintenance [Z00.00] 11/25/2012  . Folliculitis [M01.0] 27/25/3664  . History of kidney stones [Z87.442] 06/18/2012  . Chronic pain syndrome [G89.4] 06/18/2012  . Muscle spasm [M62.838] 06/18/2012  . Elevated troponin [R74.8] 04/28/2012  . Asthma with acute exacerbation [J45.901] 04/28/2012  . Allergic reaction [T78.40XA] 04/07/2012  . Itching [L29.9] 09/06/2011  . HSV infection [B00.9] 08/30/2011  . Dyslipidemia [E78.5] 04/24/2011  . Anemia [D64.9] 04/24/2011  . Diabetic neuropathy ([place]) [E11.40] 04/24/2011  . Obstructive sleep apnea [G47.33] 10/18/2010  . Asthma [493] 10/18/2010  . Obesity [E66.9] 10/18/2010  . Type 2 diabetes mellitus ([place]) [E11.9] 10/18/2010     Past Medical History:  Past Medical History:  Diagnosis Date  . Anxiety   . Asthma   . Cocaine abuse   . Complication of anesthesia    decreased bp, decreased heart rate  . Diabetes mellitus    class 2  . Disorder of nervous system   . Emphysema   . Heart attack   . High blood pressure   . High cholesterol   . Hypertension   . Incontinence   . Manic depression ([place])   . Myocardial infarct   . Pneumonia 06/2015  . Seasonal allergies   . Seizures ([place])   . Sinus trouble   . Sleep apnea   . Stroke [facility])     Past Surgical History:  Procedure Laterality Date  . ABDOMINAL HYSTERECTOMY    . CESAREAN SECTION    . HIATAL HERNIA REPAIR     Family History:  Family History  Problem Relation Age of Onset  . Cancer Father     prostate  . Cancer Mother     lung  . Depression Mother   . Depression Sister   . Anxiety disorder Sister   . Schizophrenia Sister   . Bipolar disorder Sister   . Depression Sister   . Depression Brother   . Heart failure       cousin   Social History:  History  Alcohol Use No     History  Drug Use  . Types: Cocaine    Social History   Social History  . Marital status: Widowed    Spouse name: N/A  . Number of children: 3  . Years of education: N/A   Occupational History  . disabled     factory production   Social History Main Topics  . Smoking status: Current Every Day Smoker    Packs/day: 0.50    Years: 37.00    Types: Cigarettes    Start date: 01/16/1977  . Smokeless tobacco: Never Used     Comment: Previous 2 PPD menthol full flavor  . Alcohol use No  . Drug use:     Types: Cocaine  . Sexual activity: No   Other Topics Concern  . None   Social History Narrative  . None    Hospital Course:    Ms. Isaacks is a 55 year old female with history of depression, mood instability, and cocaine addiction transferred from [facility] for suicidal and homicidal ideation.  1. Suicidal and homicidal ideation. Resolved. The patient is able to contract for safety. [name] forward thinking and optimistic about the future.  2. Mood and psychosis. The patient has been maintained on a combination of Lexapro 40 mg, Geodon 160 mg, Remeron 30 mg for bipolar illness. She presented with hyponatremia and prolongation of QTC. As a result her Remeron was discontinued, and doses of Geodon and Lexapro were cut in half. She requested to restart Remeron due to insomnia. QT 424.  3. Insomnia. The patient insists to be back on Remeron that helps her sleep.    4. COPD. [name] on inhalers and CPAP at night.   5. Hypertension. [name] on amlodipine, aspirin, furosemide, lisinopril, verapamil, and potassium.  6. Dyslipidemia. [name] on Lipitor.  7. Diabetic neuropathy. [name] on Neurontin.  8. Diabetes. [name] on metoprolol, ADA diet, and sliding scale insulin with blood glucose monitoring.  9. GERD. [name] on Protonix.  10. Smoking. [name] on nicotine patch.  11. Substance abuse. The  patient is addicted to crack. She tried outpatient treatment with no affect. She expects [name] to place her in the residential treatment program.  12. Metabolic syndrome monitoring. Lipid profileis within normal limits.   13. EPS. We added amantadine.   14. Disposition. She was discharged with [facility]. She will follow up with [facility].     Physical Findings: AIMS:  , ,  ,  ,    CIWA:    COWS:     Musculoskeletal: Strength & Muscle Tone: within normal limits Gait & Station: normal Patient leans: N/A  Psychiatric Specialty Exam: Physical Exam  Nursing note and vitals reviewed.   Review of Systems  Respiratory: Positive for shortness of breath.   Psychiatric/Behavioral: Positive for substance abuse.  All other systems reviewed and are negative.   Blood pressure (!) 148/94, pulse 81, temperature 98.2 F (36.8 C), temperature source Oral, resp. rate 18, height $RemoveBe'5\' 4"'SrnDfukWr$  (1.626 m), weight 108.9 kg (240 lb), SpO2 99 %.Body mass index is 41.2 kg/m.  General Appearance: Casual  Eye Contact:  Good  Speech:  Clear and Coherent  Volume:  Normal  Mood:  Euthymic  Affect:  Appropriate  Thought Process:  Goal Directed and Descriptions of Associations: Intact  Orientation:  Full (Time, Place, and Person)  Thought Content:  WDL  Suicidal Thoughts:  No  Homicidal Thoughts:  No  Memory:  Immediate;   Fair Recent;   Fair Remote;   Fair  Judgement:  Impaired  Insight:  Shallow  Psychomotor Activity:  Normal  Concentration:  Concentration: Fair and Attention Span: Fair  Recall:  [employer] of Knowledge:  Fair  Language:  Fair  Akathisia:  No  Handed:  Right  AIMS (if indicated):     Assets:  Communication Skills Desire for Improvement Financial Resources/Insurance Housing Resilience Social Support  ADL's:  Intact  Cognition:  WNL  Sleep:  Number of Hours: 5.45     Have you used any form of tobacco in the last 30 days? (Cigarettes,  Smokeless Tobacco, Cigars, and/or Pipes): Yes  Has this patient used any form of tobacco in the last 30 days? (Cigarettes, Smokeless Tobacco, Cigars, and/or Pipes) Yes, Yes, A prescription for an FDA-approved tobacco cessation medication was offered at discharge and the patient refused  Blood Alcohol level:  Lab Results  Component Value Date   [facility] <5 11/24/2015   ETH <5 31/54/0086    Metabolic Disorder Labs:  Lab Results  Component Value Date   HGBA1C 5.6 11/25/2015   MPG 114 11/25/2015   MPG 123 (H) 04/24/2011  No results found for: PROLACTIN Lab Results  Component Value Date   CHOL 135 12/04/2015   TRIG 90 12/04/2015   HDL 55 12/04/2015   CHOLHDL 2.5 12/04/2015   VLDL 18 12/04/2015   LDLCALC 62 12/04/2015   LDLCALC 54 07/15/2013    See Psychiatric Specialty Exam and Suicide Risk Assessment completed by Attending Physician prior to discharge.  Discharge destination:  [facility]  Is patient on multiple antipsychotic therapies at discharge:  No   Has Patient had three or more failed trials of antipsychotic monotherapy by history:  No  Recommended Plan for Multiple Antipsychotic Therapies: NA  Discharge Instructions    Diet - low sodium heart healthy    Complete by:  As directed    Increase activity slowly    Complete by:  As directed        Medication List    STOP taking these medications   loratadine 10 MG tablet Commonly known as:  CLARITIN   zolpidem 5 MG tablet Commonly known as:  AMBIEN     TAKE these medications     Indication  ACCU-CHEK AVIVA PLUS w/Device Kit CHECK BLOOD SUGAR THREE TIMES DAILY What changed:  See the new instructions.  Indication:  diabetes testing   ACCU-CHEK SOFTCLIX LANCETS lancets Three times a day before meals. What changed:  additional instructions  Indication:  diabetes testing   albuterol 108 (90 Base) MCG/ACT inhaler Commonly known as:  PROVENTIL HFA;VENTOLIN HFA Inhale 2 puffs into the lungs every 6  (six) hours as needed.  Indication:  Chronic Obstructive Lung Disease   amantadine 100 MG capsule Commonly known as:  SYMMETREL Take 1 capsule (100 mg total) by mouth 2 (two) times daily.  Indication:  Extrapyramidal Reaction caused by Medications   amLODipine 5 MG tablet Commonly known as:  NORVASC Take 2 tablets (10 mg total) by mouth daily.  Indication:  High Blood Pressure Disorder   aspirin 81 MG EC tablet Take 1 tablet (81 mg total) by mouth daily. Swallow whole.  Indication:  Inflammation   atorvastatin 40 MG tablet Commonly known as:  LIPITOR Take 1 tablet (40 mg total) by mouth daily.  Indication:  High Amount of Triglycerides in the Blood   escitalopram 20 MG tablet Commonly known as:  LEXAPRO Take 1 tablet (20 mg total) by mouth daily.  Indication:  Major Depressive Disorder   fluticasone furoate-vilanterol 100-25 MCG/INH Aepb Commonly known as:  BREO ELLIPTA INHALE 1 PUFF EVERY DAY  Indication:  Chronic Obstructive Lung Disease   furosemide 40 MG tablet Commonly known as:  LASIX Take 1 tablet (40 mg total) by mouth daily. Take Lasix twice daily from 7/14 until 7/18, then daily  Indication:  High Blood Pressure Disorder   gabapentin 600 MG tablet Commonly known as:  NEURONTIN Take 1 tablet (600 mg total) by mouth 3 (three) times daily.  Indication:  Neuropathic Pain   glucose blood test strip Use to check blood sugar 3 times daily.  Indication:  blood glucoce monitring   lisinopril 40 MG tablet Commonly known as:  PRINIVIL,ZESTRIL Take 1 tablet (40 mg total) by mouth daily.  Indication:  High Blood Pressure Disorder   metFORMIN 500 MG 24 hr tablet Commonly known as:  GLUCOPHAGE-XR Take 1 tablet (500 mg total) by mouth 2 (two) times daily.  Indication:  Type 2 Diabetes   mirtazapine 30 MG tablet Commonly known as:  REMERON Take 1 tablet (30 mg total) by mouth at bedtime.  Indication:  Trouble  Sleeping, Major Depressive Disorder   montelukast 10  MG tablet Commonly known as:  SINGULAIR Take 1 tablet (10 mg total) by mouth at bedtime.  Indication:  Hayfever   multivitamin with minerals tablet Take 1 tablet by mouth daily. Reported on 07/28/2015  Indication:  general health   naproxen 500 MG tablet Commonly known as:  NAPROSYN Take 1 tablet (500 mg total) by mouth 3 (three) times daily as needed for mild pain.  Indication:  Mild to Moderate Pain   omeprazole 40 MG capsule Commonly known as:  PRILOSEC Take 1 capsule (40 mg total) by mouth daily.  Indication:  Gastroesophageal Reflux Disease   oxybutynin 5 MG tablet Commonly known as:  DITROPAN Take 1 tablet (5 mg total) by mouth 3 (three) times daily.  Indication:  Urinary Incontinence   potassium chloride 10 MEQ tablet Commonly known as:  K-DUR,KLOR-CON Take 1 tablet (10 mEq total) by mouth 2 (two) times daily.  Indication:  Low Amount of Potassium in the Blood   tiotropium 18 MCG inhalation capsule Commonly known as:  SPIRIVA HANDIHALER Place 1 capsule (18 mcg total) into inhaler and inhale daily.  Indication:  Chronic Obstructive Lung Disease   verapamil 240 MG 24 hr capsule Commonly known as:  VERELAN PM Take 2 capsules (480 mg total) by mouth at bedtime.  Indication:  Stable Angina Pectoris   ziprasidone 80 MG capsule Commonly known as:  GEODON Take 1 capsule (80 mg total) by mouth at bedtime.  Indication:  Manic-Depression        Follow-up recommendations:  Activity:  as tolerated. Diet:  low sodium heart healthy ADA diet. Other:  keep follow up appointments.  Comments:    Signed: Orson Slick, MD 12/06/2015, 8:32 AM

## 2015-12-06 NOTE — BHH Group Notes (Signed)
[place] LCSW Group Therapy Note  Date/Time: 12/06/2015 1:00pm  Type of Therapy and Topic:  Group Therapy:  Overcoming Obstacles  Participation Level:  Minimal  Description of Group:    In this group patients will be encouraged to explore what they see as obstacles to their own wellness and recovery. They will be guided to discuss their thoughts, feelings, and behaviors related to these obstacles. The group will process together ways to cope with barriers, with attention given to specific choices patients can make. Each patient will be challenged to identify changes they are motivated to make in order to overcome their obstacles. This group will be process-oriented, with patients participating in exploration of their own experiences as well as giving and receiving support and challenge from other group members.  Therapeutic Goals: 1. Patient will identify personal and current obstacles as they relate to admission. 2. Patient will identify barriers that currently interfere with their wellness or overcoming obstacles.  3. Patient will identify feelings, thought process and behaviors related to these barriers. 4. Patient will identify two changes they are willing to make to overcome these obstacles:    Summary of Patient Progress  Pt participated with direct questioning and encouragement. She verbalizes that listening to the radio and not having a routine job are obstacles to her wellness.  She verbalizes the radio distracts her from doing important things and that not having a job means she has nothing to structure her day.   Therapeutic Modalities:   Cognitive Behavioral Therapy Solution Focused Therapy Motivational Interviewing Relapse Prevention Therapy  Dossie Arbour, MSW, LCSW

## 2015-12-06 NOTE — Plan of Care (Signed)
Problem: Coping: Goal: Ability to identify and develop effective coping behavior will improve Outcome: Progressing Pt able to redirect and isolates when faced with problems [place] RN

## 2015-12-06 NOTE — Progress Notes (Signed)
1605: [facility] with [facility] record, suicide risk assessment and after care summary. Was picked up by her son. Family support includes son, mom and other extended family members. All  belongings returned to patient. No sign of distress upon discharge.

## 2015-12-06 NOTE — Progress Notes (Signed)
Recreation Therapy Notes  Date: 11.20.17 Time: 1:00 pm Location: Craft Room  Group Topic: Wellness  Goal Area(s) Addresses:  Patient will identify at least one item per dimension of health. Patient will examine areas they are deficient in.  Behavioral Response: Attentive, Interactive  Intervention: 6 Dimensions of Wellness  Activity: Patients were given a definition worksheet and a worksheet with the different dimensions on it. Patients were instructed to write at least one item in each dimension that they were doing before they came to the hospital. LRT encouraged patients to think of 2-3 items.  Education: LRT educated patients on ways to improve each dimension.  Education Outcome: Acknowledges education/In group clarification offered   Clinical Observations/Feedback: Patient wrote in two dimensions. Patient contributed to group discussion by stating ways she can improve certain dimensions.  Leonette [facility], LRT/CTRS 12/06/2015 2:15 PM

## 2015-12-06 NOTE — Progress Notes (Signed)
  [facility] Adult Case Management Discharge Plan :  Will you be returning to the same living situation after discharge:  Yes,  pt will be returning home to [facility] to live with her son At discharge, do you have transportation home?: Yes,  pt will be picked up by her son Do you have the ability to pay for your medications: Yes,  pt will be provided with prescriptions at discharge  Release of information consent forms completed and in the chart;  Patient's signature needed at discharge.  Patient to Follow up at: Follow-up [facility] Outpatient Follow up.   Why:  Please arrive for your hospital follow up for therapy and substance abuse treatment at 12pm on Wednesday November 29th , 2017 and arrive on Monday December 4th at 10am for medication management. Contact information: [facility] [address] [place], [place] [address] Hours: Open today  Open 24 hours Phone: [phone] Fax: [phone]       [facility] Follow up.   Why:       Please arrive to the walk-in clinic Monday through Friday from 9-4pm for your assessment for your hospital follow up for medication management, substance abuse treatment and therapy. Contact information: [facility] [place], [place] [address] Phone: [phone] Fax: [phone]          Next level of care provider has access to [place] and Suicide Prevention discussed: Yes,  completed with pt  Have you used any form of tobacco in the last 30 days? (Cigarettes, Smokeless Tobacco, Cigars, and/or Pipes): Yes  Has patient been referred to the Quitline?: Patient refused referral  Patient has been referred for addiction treatment: Yes  Alphonse Guild Shylyn Younce 12/06/2015, 4:23 PM

## 2015-12-06 NOTE — BHH Suicide Risk Assessment (Signed)
[place] INPATIENT:  Family/Significant Other Suicide Prevention Education  Suicide Prevention Education:  Patient Refusal for Family/Significant Other Suicide Prevention Education: The patient Tiffany [name] has refused to provide written consent for family/significant other to be provided Family/Significant Other Suicide Prevention Education during admission and/or prior to discharge.  Physician notified.  [name] completed SPE with the pt.    Alphonse Guild Chadrick Sprinkle 12/06/2015, 11:03 AM

## 2015-12-06 NOTE — BHH Group Notes (Signed)
[facility] LCSW Group Therapy Note  Date/Time: 12/06/2015 1:00pm  Type of Therapy and Topic:  Group Therapy:  Overcoming Obstacles  Participation Level: Active  Description of Group:    In this group patients will be encouraged to explore what they see as obstacles to their own wellness and recovery. They will be guided to discuss their thoughts, feelings, and behaviors related to these obstacles. The group will process together ways to cope with barriers, with attention given to specific choices patients can make. Each patient will be challenged to identify changes they are motivated to make in order to overcome their obstacles. This group will be process-oriented, with patients participating in exploration of their own experiences as well as giving and receiving support and challenge from other group members.  Therapeutic Goals: 1. Patient will identify personal and current obstacles as they relate to admission. 2. Patient will identify barriers that currently interfere with their wellness or overcoming obstacles.  3. Patient will identify feelings, thought process and behaviors related to these barriers. 4. Patient will identify two changes they are willing to make to overcome these obstacles:    Summary of Patient Progress  Pt participated sharing that a limited support system and negative thinking were the main obstacles to his wellness.  He is able to identify other supports to draw from to grow supports and allowing negative thoughts like "everyone is working against me"to discourage him from continue trying.   Therapeutic Modalities:   Cognitive Behavioral Therapy Solution Focused Therapy Motivational Interviewing Relapse Prevention Therapy  Dossie Arbour, MSW, LCSW

## 2015-12-06 NOTE — Plan of Care (Signed)
Problem: [facility] Participation in Recreation Therapeutic Interventions Goal: STG-Patient will identify at least five coping skills for ** STG: Coping Skills - Within 4 treatment sessions, patient will verbalize at least 5 coping skills for substance abuse in each of 2 treatment sessions to decrease substance abuse post d/c.  Outcome: Completed/Met Date Met: 12/06/15 Treatment Session 2; Completed 2 out of 2: At approximately 10:35 am, LRT met with patient in community room. Patient verbalized 5 coping skills for substance abuse. LRT encouraged patient to use her coping skills instead of using substances.  Leonette [facility], LRT/CTRS 11.20.17 11:18 am

## 2015-12-06 NOTE — BHH Suicide Risk Assessment (Signed)
[facility] Discharge Suicide Risk Assessment   Principal Problem: Bipolar affective disorder, mixed, severe, with psychotic behavior (HCC)2 Discharge Diagnoses:  Patient Active Problem List   Diagnosis Date Noted  . Bipolar affective disorder, mixed, severe, with psychotic behavior ([place]) [F31.64] 11/28/2015  . Cocaine use disorder, severe, dependence ([place]) [F14.20] 11/28/2015  . Hyponatremia [E87.1] 11/25/2015  . AKI (acute kidney injury) ([place]) [N17.9] 11/24/2015  . Blood in sputum [R04.2]   . Acute on chronic congestive heart failure ([place]) [I50.9]   . SOB (shortness of breath) [R06.02]   . Hypoxia [R09.02] 08/19/2015  . COPD with acute exacerbation ([place]) [J44.1] 08/19/2015  . Acute on chronic respiratory failure with hypoxia and hypercapnia (HCC) [S85.46, J96.22] 06/22/2015  . Acute respiratory failure with hypoxemia ([place]) [J96.01] 06/22/2015  . Scabies [B86] 06/01/2015  . Onychomycosis [B35.1] 01/21/2015  . Nasal congestion [R09.81] 12/17/2014  . Tobacco use disorder [F17.200] 07/22/2014  . COPD (chronic obstructive pulmonary disease) ([place]) [J44.9] 07/08/2014  . COPD exacerbation ([place]) [J44.1] 06/26/2014  . Seborrheic keratoses [L82.1] 12/31/2013  . Thrush [B37.0] 09/19/2013  . Knee pain, bilateral [M25.561, M25.562] 01/22/2013  . Seizure ([place]) [R56.9] 01/04/2013  . Health care maintenance [Z00.00] 11/25/2012  . Folliculitis [E70.3] 50/09/3816  . History of kidney stones [Z87.442] 06/18/2012  . Chronic pain syndrome [G89.4] 06/18/2012  . Muscle spasm [M62.838] 06/18/2012  . Elevated troponin [R74.8] 04/28/2012  . Asthma with acute exacerbation [J45.901] 04/28/2012  . Allergic reaction [T78.40XA] 04/07/2012  . Itching [L29.9] 09/06/2011  . HSV infection [B00.9] 08/30/2011  . Dyslipidemia [E78.5] 04/24/2011  . Anemia [D64.9] 04/24/2011  . Diabetic neuropathy ([place]) [E11.40] 04/24/2011  . Obstructive sleep apnea [G47.33] 10/18/2010  . Asthma [493] 10/18/2010  . Obesity [E66.9]  10/18/2010  . Type 2 diabetes mellitus ([place]) [E11.9] 10/18/2010    Total Time spent with patient: 30 minutes  Musculoskeletal: Strength & Muscle Tone: within normal limits Gait & Station: normal Patient leans: N/A  Psychiatric Specialty Exam: Review of Systems  Respiratory: Positive for shortness of breath.   Psychiatric/Behavioral: Positive for substance abuse.  All other systems reviewed and are negative.   Blood pressure (!) 148/94, pulse 81, temperature 98.2 F (36.8 C), temperature source Oral, resp. rate 18, height 5\' 4"  (1.626 m), weight 108.9 kg (240 lb), SpO2 99 %.Body mass index is 41.2 kg/m.  General Appearance: Casual  Eye Contact::  Good  Speech:  Clear and Coherent409  Volume:  Normal  Mood:  Euthymic  Affect:  Appropriate  Thought Process:  Goal Directed and Descriptions of Associations: Intact  Orientation:  Full (Time, Place, and Person)  Thought Content:  WDL  Suicidal Thoughts:  No  Homicidal Thoughts:  No  Memory:  Immediate;   Fair Recent;   Fair Remote;   Fair  Judgement:  Impaired  Insight:  Shallow  Psychomotor Activity:  Normal  Concentration:  Fair  Recall:  [place]  Language: Fair  Akathisia:  No  Handed:  Right  AIMS (if indicated):     Assets:  Communication Skills Desire for Improvement Financial Resources/Insurance Housing Resilience Social Support  Sleep:  Number of Hours: 5.45  Cognition: WNL  ADL's:  Intact   Mental Status Per Nursing Assessment::   On Admission:     Demographic Factors:  NA  Loss Factors: Decline in physical health and Financial problems/change in socioeconomic status  Historical Factors: Prior suicide attempts, Family history of mental illness or substance abuse and Impulsivity  Risk Reduction Factors:   Responsible for  children under 54 years of age, Sense of responsibility to family, Living with another person, especially a relative, Positive social support and Positive  therapeutic relationship  Continued Clinical Symptoms:  Bipolar Disorder:   Depressive phase Alcohol/Substance Abuse/Dependencies  Cognitive Features That Contribute To Risk:  None    Suicide Risk:  Minimal: No identifiable suicidal ideation.  Patients presenting with no risk factors but with morbid ruminations; may be classified as minimal risk based on the severity of the depressive symptoms    Plan Of Care/Follow-up recommendations:  Activity:  as tolerated. Diet:  low sodium heart haelthy ADA diet. Other:  keep follow up appointments.  Orson Slick, MD 12/06/2015, 8:32 AM

## 2015-12-06 NOTE — Progress Notes (Signed)
D: Patient is alert and oriented on the unit this shift. Patient attended and no   Participation  in groups today. Patient denies suicidal ideation, homicidal ideation, auditory or visual hallucinations at the present time.  A: Scheduled medications are administered to patient as per MD orders. Emotional support and encouragement are provided. Patient is maintained on q.15 minute safety checks. Patient is informed to notify staff with questions or concerns. R: No adverse medication reactions are noted. Patient is cooperative with medication administration and treatment plan today. Patient is receptive, calm,flat affect and cooperative on the unit at this time. Patient does not interact  with others on the unit this shift. Patient contracts for safety at this time. Patient remains safe at this time.

## 2015-12-06 NOTE — Progress Notes (Signed)
D:  Pt is flat during interaction, pleasant, took morning medications, ate breakfast appetite good, went to am group, no complaints at this time, plan is for patient to discharge today, denies SI/HI/AVH, morning EKG done and placed on chart.  A:  Emotional support provided, Encouraged pt to continue with treatment plan and attend all group activities, q15 min checks maintained for safety.  R:  Pt is receptive, going to groups, pleasant and cooperative with staff and other patients on the unit.

## 2015-12-06 NOTE — Progress Notes (Signed)
Recreation Therapy Notes  INPATIENT RECREATION TR PLAN  Patient Details Name: Tiffany [name] MRN: [id number] DOB: 01/08/1961 Today's Date: 12/06/2015  Rec Therapy Plan Is patient appropriate for Therapeutic Recreation?: Yes Treatment times per week: At least once a week TR Treatment/Interventions: 1:1 session, Group participation (Comment) (Appropriate participation in daily recreational therapy tx)  Discharge Criteria Pt will be discharged from therapy if:: Treatment goals are met, Discharged Treatment plan/goals/alternatives discussed and agreed upon by:: Patient/family  Discharge Summary Short term goals set: See Care Plan Short term goals met: Complete Progress toward goals comments: One-to-one attended Which groups?: Wellness, Social skills, Leisure education One-to-one attended: Coping skills Reason goals not met: N/A Therapeutic equipment acquired: None Reason patient discharged from therapy: Discharge from hospital Pt/family agrees with progress & goals achieved: Yes Date patient discharged from therapy: 12/06/15   , M, LRT/CTRS 12/06/2015, 4:17 PM  

## 2015-12-06 NOTE — BHH Group Notes (Signed)
[facility] Group Notes:  (Nursing/MHT/Case Management/Adjunct)  Date:  12/06/2015  Time:  4:25 AM  Type of Therapy:  Group Therapy  Participation Level:  Active  Participation Quality:  Appropriate  Affect:  Appropriate  Cognitive:  Appropriate  Insight:  Appropriate  Engagement in Group:  Engaged  Modes of Intervention:  Discussion  Summary of Progress/Problems:   Tiffany [name] 12/06/2015, 4:25 AM

## 2015-12-15 ENCOUNTER — Ambulatory Visit (INDEPENDENT_AMBULATORY_CARE_PROVIDER_SITE_OTHER): Payer: Medicare HMO | Admitting: Clinical

## 2015-12-15 ENCOUNTER — Encounter (HOSPITAL_COMMUNITY): Payer: Self-pay | Admitting: Clinical

## 2015-12-15 DIAGNOSIS — F1590 Other stimulant use, unspecified, uncomplicated: Secondary | ICD-10-CM | POA: Diagnosis not present

## 2015-12-15 DIAGNOSIS — F250 Schizoaffective disorder, bipolar type: Secondary | ICD-10-CM | POA: Diagnosis not present
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
## 2015-12-16 ENCOUNTER — Encounter: Payer: Self-pay | Admitting: Family Medicine

## 2015-12-16 ENCOUNTER — Ambulatory Visit (INDEPENDENT_AMBULATORY_CARE_PROVIDER_SITE_OTHER): Payer: Commercial Managed Care - HMO | Admitting: Family Medicine

## 2015-12-16 ENCOUNTER — Ambulatory Visit (HOSPITAL_COMMUNITY)
Admission: RE | Admit: 2015-12-16 | Discharge: 2015-12-16 | Disposition: A | Discharge status: Home / Self Care | Payer: Commercial Managed Care - HMO | Source: Ambulatory Visit | Attending: Family Medicine | Admitting: Family Medicine

## 2015-12-16 ENCOUNTER — Ambulatory Visit: Payer: Self-pay | Admitting: Family Medicine

## 2015-12-16 VITALS — BP 113/64 | HR 81 | Temp 98.2°F | Wt 255.6 lb

## 2015-12-16 DIAGNOSIS — Z79899 Other long term (current) drug therapy: Secondary | ICD-10-CM | POA: Diagnosis present

## 2015-12-16 DIAGNOSIS — E871 Hypo-osmolality and hyponatremia: Secondary | ICD-10-CM | POA: Diagnosis not present

## 2015-12-16 DIAGNOSIS — F3164 Bipolar disorder, current episode mixed, severe, with psychotic features: Secondary | ICD-10-CM | POA: Diagnosis not present

## 2015-12-16 DIAGNOSIS — F1410 Cocaine abuse, uncomplicated: Secondary | ICD-10-CM

## 2015-12-16 LAB — BASIC METABOLIC PANEL WITHOUT GFR
BUN: 20 mg/dL (ref 7–25)
CO2: 28 mmol/L (ref 20–31)
Calcium: 9 mg/dL (ref 8.6–10.4)
Chloride: 105 mmol/L (ref 98–110)
Creat: 1.5 mg/dL — ABNORMAL HIGH (ref 0.50–1.05)
GFR, Est African American: 45 mL/min — ABNORMAL LOW
GFR, Est Non African American: 39 mL/min — ABNORMAL LOW
Glucose, Bld: 78 mg/dL (ref 65–99)
Potassium: 4.2 mmol/L (ref 3.5–5.3)
Sodium: 142 mmol/L (ref 135–146)

## 2015-12-16 MED ORDER — AMLODIPINE BESYLATE 5 MG PO TABS: 10.0000 mg | ORAL_TABLET | Freq: Every day | ORAL | 1 refills | Status: DC

## 2015-12-16 MED ORDER — POTASSIUM CHLORIDE CRYS ER 10 MEQ PO TBCR: 10.0000 meq | EXTENDED_RELEASE_TABLET | Freq: Two times a day (BID) | ORAL | 1 refills | Status: DC

## 2015-12-16 NOTE — Progress Notes (Signed)
Subjective: DX:AJOINOMV follow up HPI: Patient is a 55 y.o. female with a past medical history of substance abuse, bipolar affective disorder, COPD, and type 2 diabetes among other things presenting to clinic today for a hospital follow-up..  She presented to the ED with hallucinations and paranoia acutely intoxicated on cocaine. She was found to have ultrasound abnormality such as hyponatremia. When stabilized, she was transferred to [facility] for inpatient psychiatric treatment due to suicidal and homicidal ideation. During her hospitalization, she was noted to have lip smacking. She started on amantadine and notes that this has gotten better. She notes being sober from cocaine since her discharge. She last met with her counselor yesterday who is going to work on getting her an intensive outpatient program so that she can stay sober. She's been going to meetings at least 4-5 times per week. She states this time, her reason for staying sober is for herself. She states that she likes her life better when she is sober. She does not feel that her son, [name], needs to watch over her as he's done in the past. She notes that she even went to the grocery store.    She does note that her weight is increased from 236 in the hospital to 255lbs today. Patietnt dosen't feel like she's eating as much since she was in the hospital. Eating spaghetti, pork chops (fried), or fried chicken. Drinking sodas. She didn't eat this type of food in the hospital. She feels her LE swelling is stable. Denies new/worsening SOB.   Social History: continues to smoke  Health Maintenance: declines flu vaccine  ROS: All other systems reviewed and are negative.  Past Medical History Patient Active Problem List   Diagnosis Date Noted  . Cocaine abuse 12/17/2015  . Bipolar affective disorder, mixed, severe, with psychotic behavior ([place]) 11/28/2015  . Cocaine use disorder, severe, dependence ([place]) 11/28/2015  .  Hyponatremia 11/25/2015  . AKI (acute kidney injury) ([place]) 11/24/2015  . Blood in sputum   . Acute on chronic congestive heart failure ([place])   . SOB (shortness of breath)   . Hypoxia 08/19/2015  . COPD with acute exacerbation ([place]) 08/19/2015  . Acute on chronic respiratory failure with hypoxia and hypercapnia ([place]) 06/22/2015  . Acute respiratory failure with hypoxemia ([place]) 06/22/2015  . Onychomycosis 01/21/2015  . Nasal congestion 12/17/2014  . Tobacco use disorder 07/22/2014  . COPD (chronic obstructive pulmonary disease) ([place]) 07/08/2014  . COPD exacerbation ([place]) 06/26/2014  . Seborrheic keratoses 12/31/2013  . Thrush 09/19/2013  . Knee pain, bilateral 01/22/2013  . Seizure ([place]) 01/04/2013  . Health care maintenance 11/25/2012  . Folliculitis 67/20/9470  . History of kidney stones 06/18/2012  . Chronic pain syndrome 06/18/2012  . Muscle spasm 06/18/2012  . Elevated troponin 04/28/2012  . Asthma with acute exacerbation 04/28/2012  . Allergic reaction 04/07/2012  . Itching 09/06/2011  . HSV infection 08/30/2011  . Dyslipidemia 04/24/2011  . Anemia 04/24/2011  . Diabetic neuropathy ([place]) 04/24/2011  . Obstructive sleep apnea 10/18/2010  . Asthma 10/18/2010  . Obesity 10/18/2010  . Type 2 diabetes mellitus ([place]) 10/18/2010    Medications- reviewed and updated  Objective: Office vital signs reviewed. BP 113/64 (BP Location: Left Arm, Patient Position: Sitting, Cuff Size: Normal)   Pulse 81   Temp 98.2 F (36.8 C) (Oral)   Wt 255 lb 9.6 oz (115.9 kg)   LMP  (Exact Date)   BMI 43.87 kg/m    Physical Examination:  General:  Awake, alert, well- nourished, NAD Cardio: RRR, no m/r/g noted. No JVD noted.  Pulm: No increased WOB.  CTAB, without wheezes, rhonchi or crackles noted.  Extremities: Trace pitting edema noted BL. MSK: Normal gait and station Skin: dry, intact, no rashes or lesions Neuro: mild lip smacking.  Psych: speech tone and rate normal,  non-pressured. No SI or HI. No AVH. No paranoia. Does not appear internally distracted.   EKG: Normal sinus rhythm. Heart rate 73. No ST elevation or depression. No significant changes since our last EKG. QTC interval 471  Assessment/Plan: Hyponatremia Will repeat BMP today to ensure that this has remained stable since her hospitalization.  COPD (chronic obstructive pulmonary disease) (HCC) Stable. The patient was in the hospital for her last pulmonology appointment. Her appointment was scheduled again for December 14. We discussed that she must attend this appointment, as they will not reschedule her appointment in the future due to several no-shows prior to her hospitalization.  Cocaine abuse Patient notes being sober since her hospitalization. She can verbalize reasons to stay sober to me at today's visit, which she could not do during her last visit. Her hallucinations and paranoia have resolved. We discussed the importance of continuing to go to her meetings, following up with her counselor, and seeking intensive outpatient programs.  She was also provided several resources if she were to be concerned about relapsing. The patient should follow-up with me in one month or sooner as needed.  Bipolar affective disorder, mixed, severe, with psychotic behavior ([place]) Stable. Given her history of prolonged QTC, I repeated a EKG today as her next appointment with her psychiatrist is not for another month. Her QTC was stable at 471 today.   Orders Placed This Encounter  Procedures  . BASIC METABOLIC PANEL WITH GFR  . EKG 12-Lead    Meds ordered this encounter  Medications  . amLODipine (NORVASC) 5 MG tablet    Sig: Take 2 tablets (10 mg total) by mouth daily.    Dispense:  90 tablet    Refill:  1  . potassium chloride (K-DUR,KLOR-CON) 10 MEQ tablet    Sig: Take 1 tablet (10 mEq total) by mouth 2 (two) times daily.    Dispense:  180 tablet    Refill:  [place] PGY-3,  [place]

## 2015-12-16 NOTE — Patient Instructions (Addendum)
I am glad to see you doing much better Panic is important to continue to go to your meetings and to set up an intensive outpatient program. For your lungs, you have an appointment with the pulmonologist on December 14 at 3:45 PM at [facility] pulmonology. Please do not miss his appointment, they will not reschedule.  For now, continue with your current regimen. Try to avoid frying as many foods, so that we can make sure you don't gain too much weight.   Finding Treatment for Addiction Introduction WHAT IS ADDICTION? Addiction is a complex disease of the brain. It causes an uncontrollable (compulsive) need for a substance. You can be addicted to alcohol, illegal drugs, or prescription medicines such as painkillers. Addiction can also be a behavior, like gambling or shopping. The need for the drug or activity can become so strong that you think about it all the time. You can also become physically dependent on a substance. Addiction can change the way your brain works. Because of these changes, getting more of whatever you are addicted to becomes the most important thing to you and feels better than other activities or relationships. Addiction can lead to changes in health, behavior, emotions, relationships, and choices that affect you and everyone around you. HOW DO I KNOW IF I NEED TREATMENT FOR ADDICTION? Addiction is a progressive disease. Without treatment, addiction can get worse. Living with addiction puts you at higher risk for injury, poor health, lost employment, loss of money, and even death. You might need treatment for addiction if:  You have tried to stop or cut down, but you cannot.  Your addiction is causing physical health problems.  You find it annoying that your friends and family are concerned about your alcohol or substance use.  You feel guilty about substance abuse or a compulsive behavior.  You have lied or tried to hide your addiction.  You need a particular substance  or activity to start your day or to calm down.  You are getting in trouble at school, work, home, or with the police.  You have done something illegal to support your addiction.  You are running out of money because of your addiction.  You have no time for anything other than your addiction. WHAT TYPES OF TREATMENT ARE AVAILABLE? The treatment program that is right for you will depend on many factors, including the type of addiction you have. Treatment programs can be outpatient or inpatient. In an outpatient program, you live at home and go to work or school, but you also go to a clinic for treatment. With an inpatient program, you live and sleep at the program facility during treatment. After treatment, you might need a plan for support during recovery. Other treatment options include:  Medicine.  Some addictions may be treated with prescription medicines.  You might also need medicine to treat anxiety or depression.  Counseling and behavior therapy. Therapy can help individuals and families behave in healthier ways and relate more effectively.  Support groups. Confidential group therapy, such as a 12-step program, can help individuals and families during treatment and recovery. No single type of program is right for everyone. Many treatment programs involve a combination of education, counseling, and a 12-step, spiritually-based approach. Some treatment programs are government sponsored. They are geared for patients who do not have private insurance. Treatment programs can vary in many respects, such as:  Cost and types of insurance that are accepted.  Types of on-site medical services that are offered.  Length of stay, setting, and size.  Overall philosophy of treatment. WHAT SHOULD I CONSIDER WHEN SELECTING A TREATMENT PROGRAM? It is important to think about your individual requirements when selecting a treatment program. There are a number of things to consider, such as:  If  the program is certified by the appropriate government agency. Even private programs must be certified and employ certified professionals.  If the program is covered by your insurance. If finances are a concern, the first call you should make is to your insurance company, if you have health insurance. Ask for a list of treatment programs that are in your network, and confirm any copayments and deductibles that you may have to pay.  If you do not have insurance, or if you choose to attend a program that does not accept your insurance, discuss whether a payment plan can be set up.  If treatment is available in languages other than English, if needed.  If the program offers detoxification treatment, if needed.  If 12-step meetings are held at the center or if transport is available for patients to attend meetings at other locations.  If the program is professional, organized, and clean.  If the program meets all of your needs, including physical and cultural needs.  If the facility offers specific treatment for your particular addiction.  If support continues to be offered after you have left the program.  If your treatment plan is continually looked at to make sure you are receiving the right treatment at the right time.  If mental health counseling is part of your treatment.  If medicine is included in treatment, if needed.  If your family is included in your treatment plan and if support is offered to them throughout the treatment process.  How the treatment works to prevent relapse. WHERE ELSE CAN I GET HELP?  Your health care provider. Ask him or her to help you find addiction treatment. These discussions are confidential.  The [employer] on Alcoholism and Drug Dependence (NCADD). This group has information about treatment centers and programs for people who have an addiction and for family members.  The telephone number is 1-800-NCA-CALL (267 254 9570).  The website is  https://ncadd.org/about-ncadd/our-affiliates  The [facility]). This group will help you find publicly funded treatment centers, help hotlines, and counseling services near you.  The telephone number is 1-800-662-HELP 561-309-6264).  The website is www.findtreatment.[employer].com.cy In countries outside of the [place] and [place], look in [employer] for contact information for services in your area. This information is not intended to replace advice given to you by your health care provider. Make sure you discuss any questions you have with your health care provider. Document Released: 12/01/2004 Document Revised: 06/10/2015 Document Reviewed: 10/21/2013  2017 Elsevier

## 2015-12-17 ENCOUNTER — Encounter: Payer: Self-pay | Admitting: Family Medicine

## 2015-12-17 ENCOUNTER — Other Ambulatory Visit: Payer: Self-pay | Admitting: *Deleted

## 2015-12-17 DIAGNOSIS — F1421 Cocaine dependence, in remission: Secondary | ICD-10-CM

## 2015-12-17 DIAGNOSIS — F1410 Cocaine abuse, uncomplicated: Principal | ICD-10-CM

## 2015-12-17 HISTORY — DX: Cocaine dependence, in remission: F14.21

## 2015-12-17 IMAGING — CR DG CHEST 2V
2 series · 2 of 2 positions shown · non-contrast
Comparison: 03/27/2012

CLINICAL DATA: Chest pain, shortness of Breath

EXAM:
CHEST - 2 VIEW

[w chest pa]
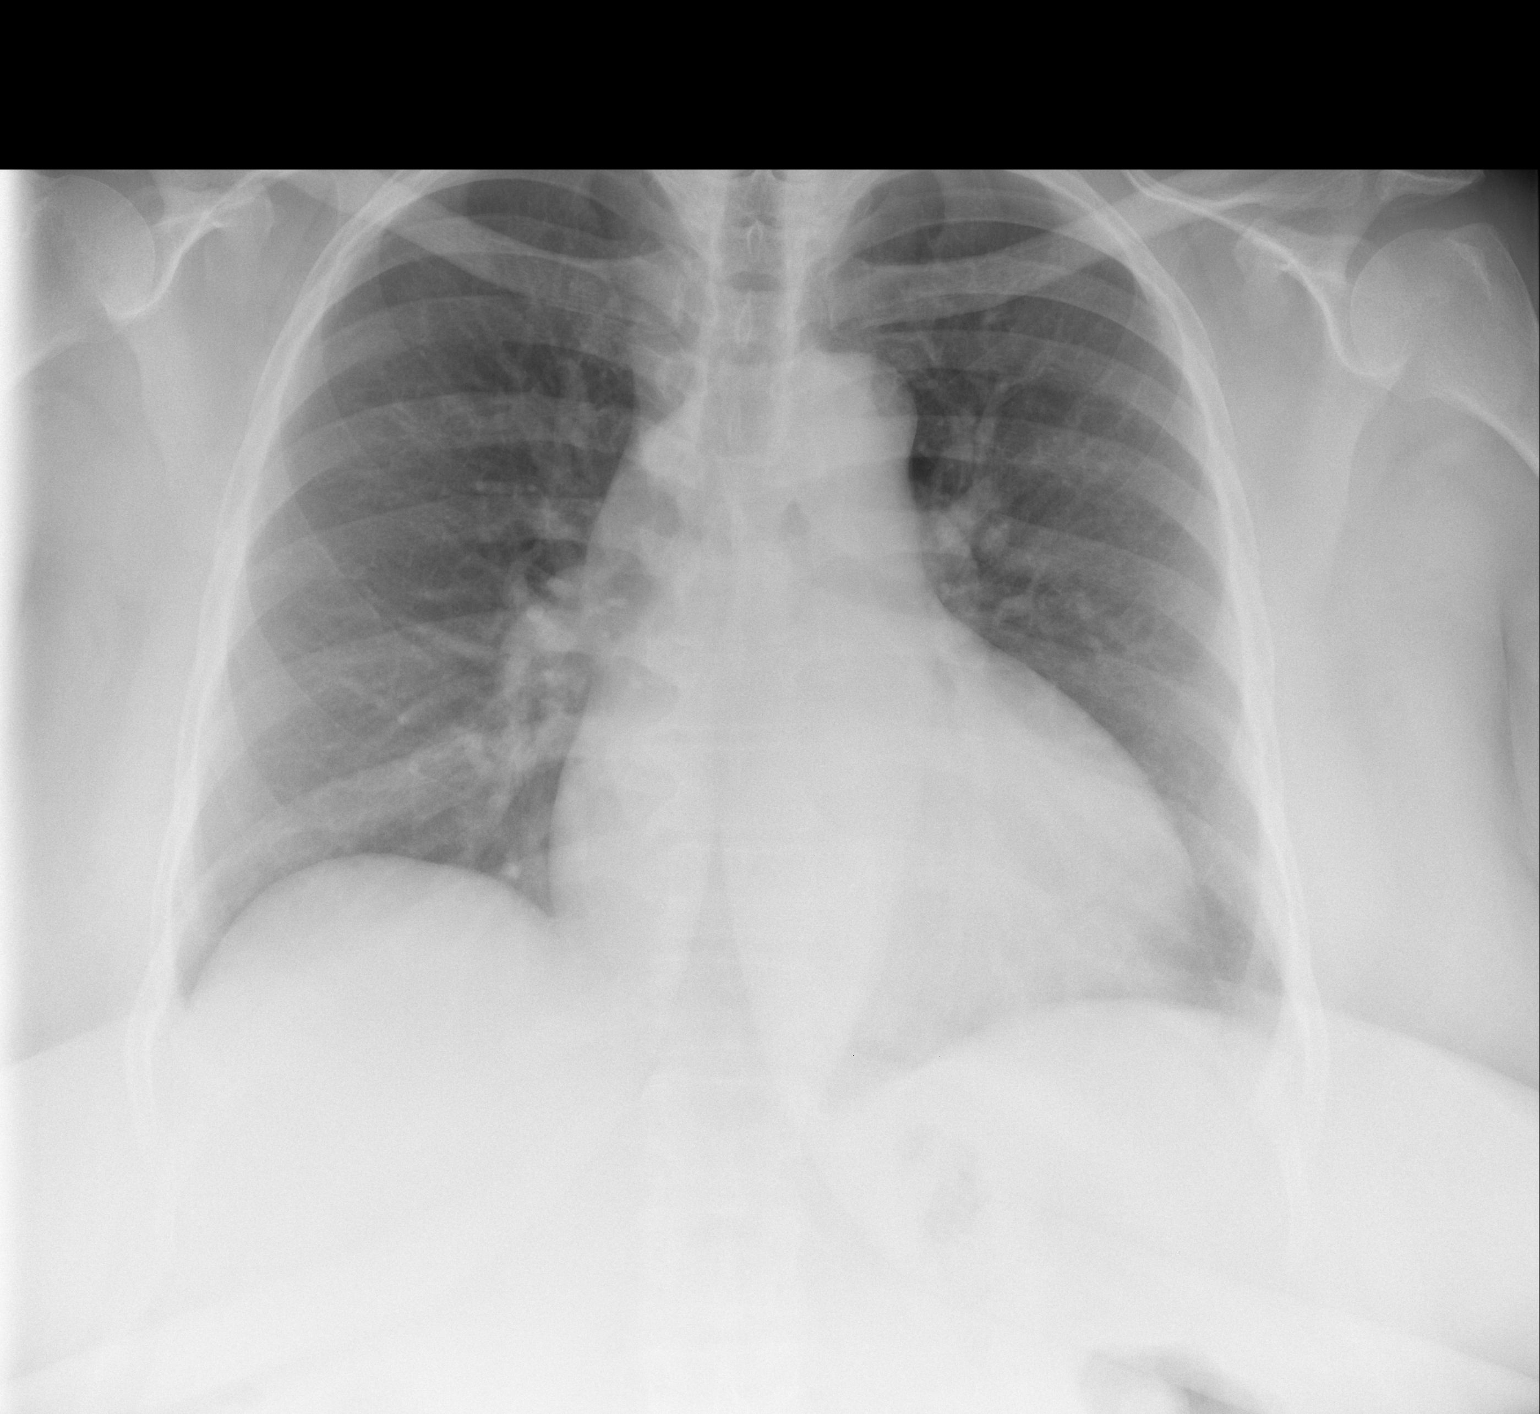

[w chest lat *]
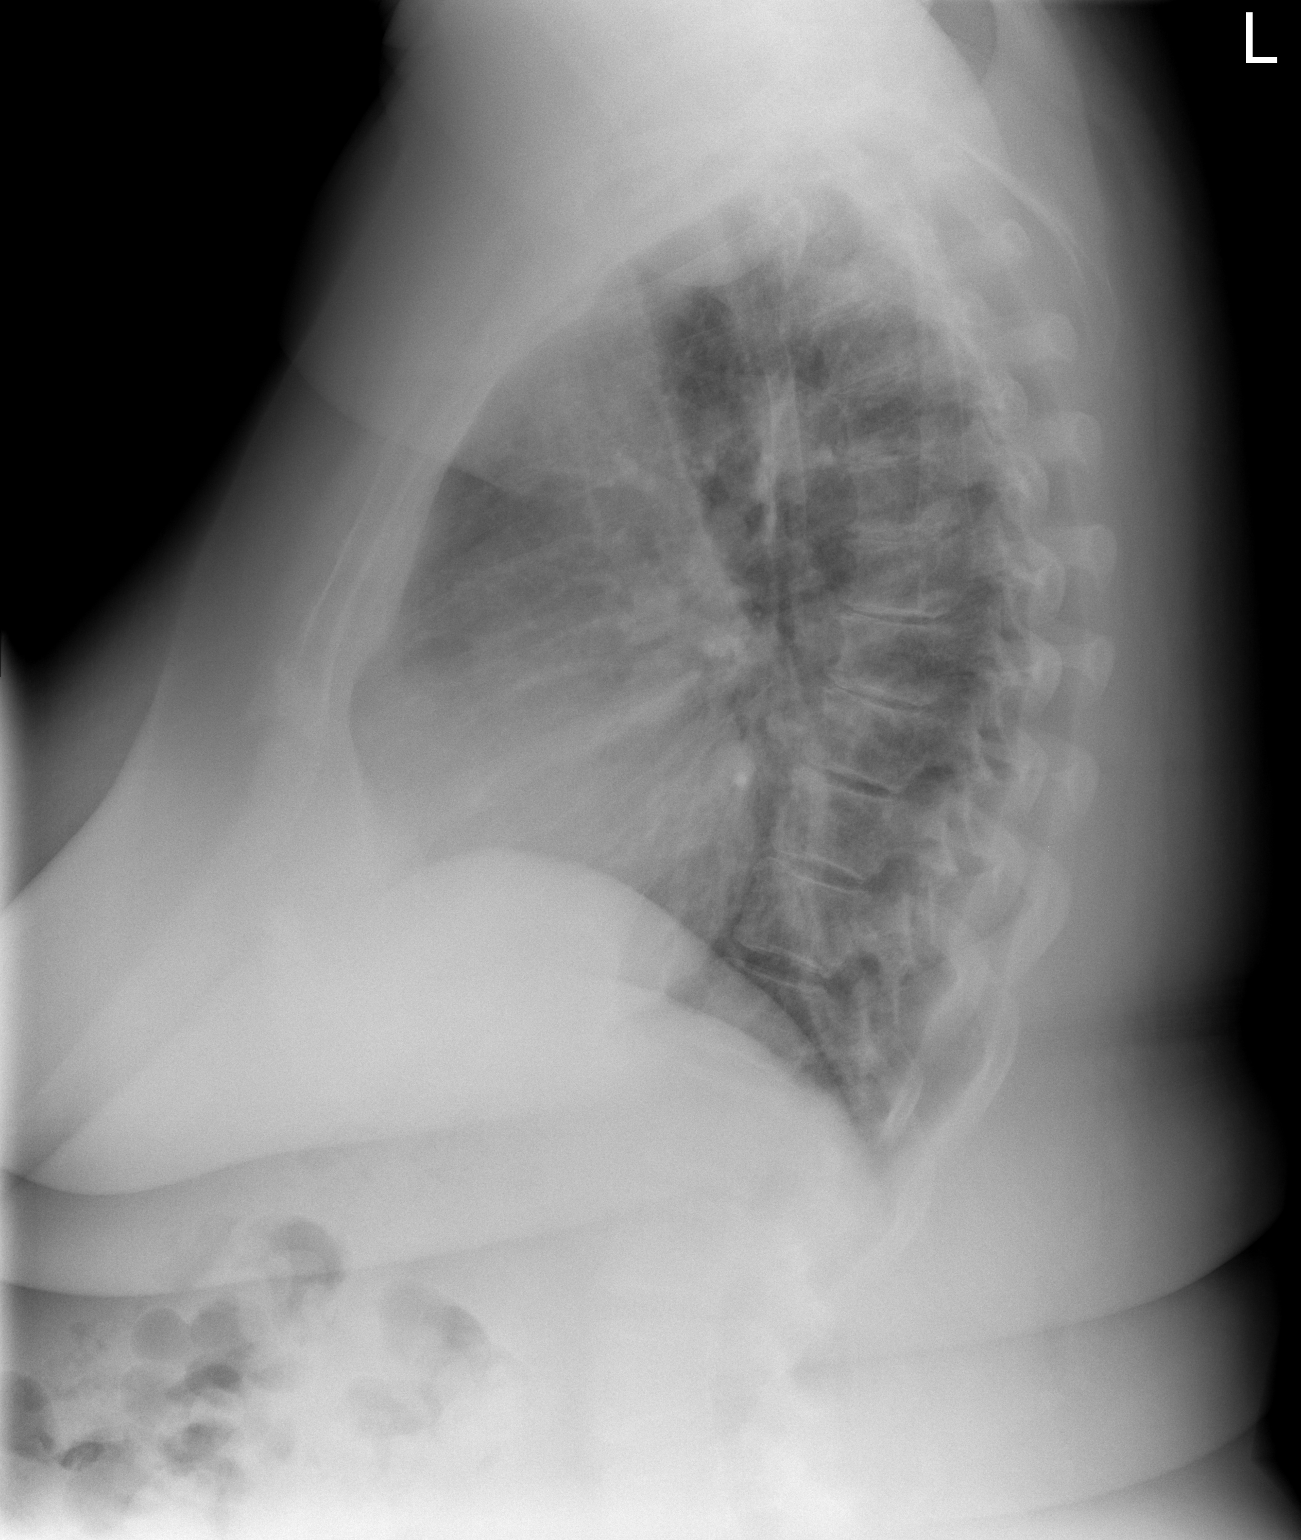

[2 of 2 positions shown; findings below may reference images not displayed]

FINDINGS: Mild cardiomegaly. Tortuous aorta. Lungs clear. No effusion.
Regional bones unremarkable.
IMPRESSION: Stable cardiomegaly.  No acute disease.

## 2015-12-17 NOTE — Patient Outreach (Signed)
[facility]) Care Management  12/17/2015  Tiffany [name] March 06, 1960 [id number]   Call/voice message received from member stating that she was recently discharged from hospital and would like to participate in [facility] care management services again, stating "I'm sick, I'm real sick and I need some help."  Case reviewed with clinical assistant director prior to call back to member as member was recently closed due to inability to maintain contact and failure to progress toward goals.  Member was admitted to hospital for cocaine abuse, per notes was also experiencing suicidal and homicidal ideations.  Decision made to manage member telephonically at this time.  Call placed to member to follow up on recent discharge.  She state that she was initially discharged to inpatient psych for treatment, but state that she was discharged due to having legal issues for which she is expected in court for within the next couple weeks.  She state that she is to follow up with outpatient treatment, which she denies starting yet.  She state she contacted the treatment center and will begin within the next week.  Member made aware that her mental health management is priority at this time.  She has followed up with her primary MD.  She confirms that she is aware of her appointment with the pulmonologist on 12/14.  [name] informed that this is her last chance at an appointment due to several no-shows and must attend.  She verbalizes understanding and state that she still has the money order that she was provided.  Member is aware that this care manager will follow up telephonically only over the next couple weeks.  She verbalizes understanding.  Will follow up next week to confirm that she has started outpatient treatment for mental health management.  [name], [place], MSN [place] [phone]

## 2015-12-17 NOTE — Assessment & Plan Note (Signed)
Stable. The patient was in the hospital for her last pulmonology appointment. Her appointment was scheduled again for December 14. We discussed that she must attend this appointment, as they will not reschedule her appointment in the future due to several no-shows prior to her hospitalization.

## 2015-12-17 NOTE — Assessment & Plan Note (Signed)
Will repeat BMP today to ensure that this has remained stable since her hospitalization.

## 2015-12-17 NOTE — Assessment & Plan Note (Signed)
Stable. Given her history of prolonged QTC, I repeated a EKG today as her next appointment with her psychiatrist is not for another month. Her QTC was stable at 471 today.

## 2015-12-17 NOTE — Assessment & Plan Note (Signed)
Patient notes being sober since her hospitalization. She can verbalize reasons to stay sober to me at today's visit, which she could not do during her last visit. Her hallucinations and paranoia have resolved. We discussed the importance of continuing to go to her meetings, following up with her counselor, and seeking intensive outpatient programs.  She was also provided several resources if she were to be concerned about relapsing. The patient should follow-up with me in one month or sooner as needed.

## 2015-12-18 IMAGING — CT CT HEAD W/O CM
1 series · 16 of 30 positions shown, 20 images · non-contrast
Comparison: 04/24/2011

CLINICAL DATA: Severe headache

EXAM:
CT HEAD WITHOUT CONTRAST
TECHNIQUE: Contiguous axial images were obtained from the base of the skull
through the vertex without intravenous contrast.

[Series 2: head 5.0 h30s · axial · 0.43mm/px · z∈[+1172,+1312]mm · 16 of 32 slices shown, 20 images]
[im 2/32  brain]
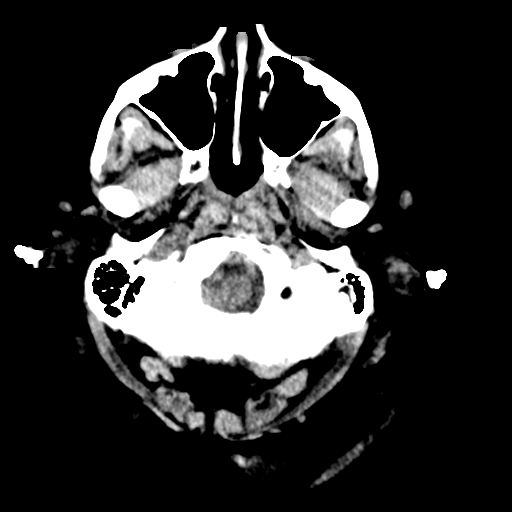
[im 2/32  bone]
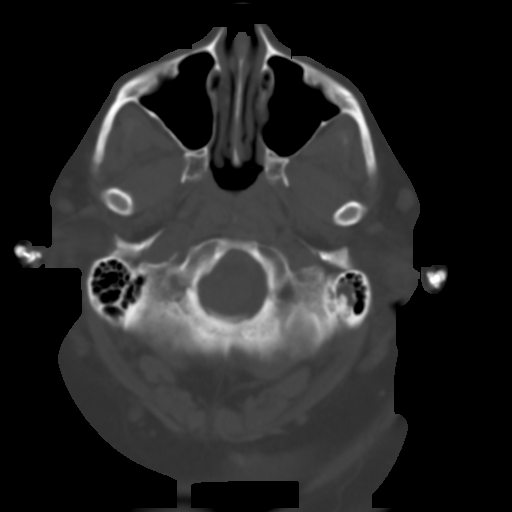
[im 4/32  brain]
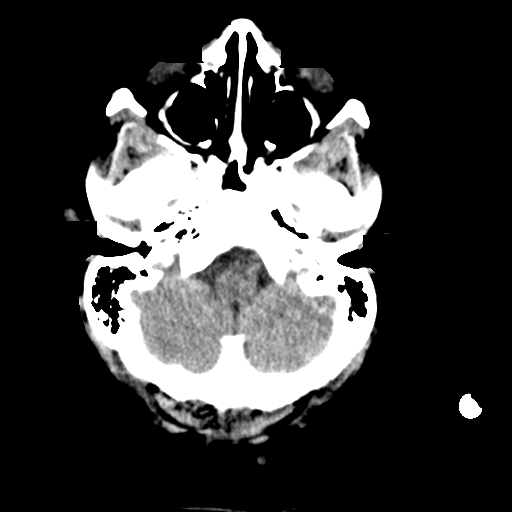
[im 6/32  brain]
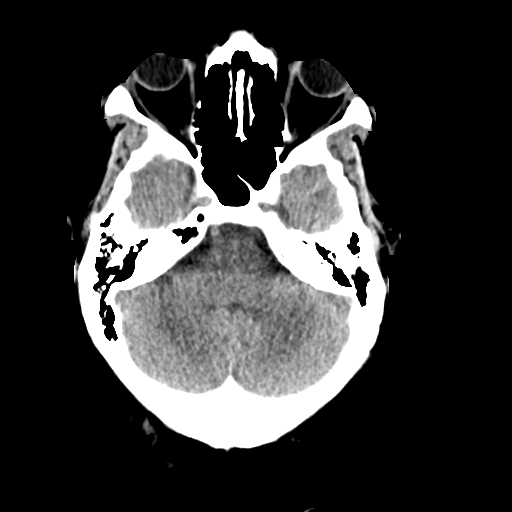
[im 8/32  brain]
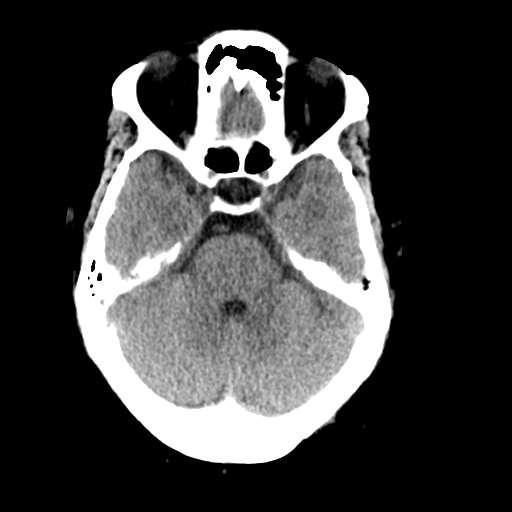
[im 9/32  brain]
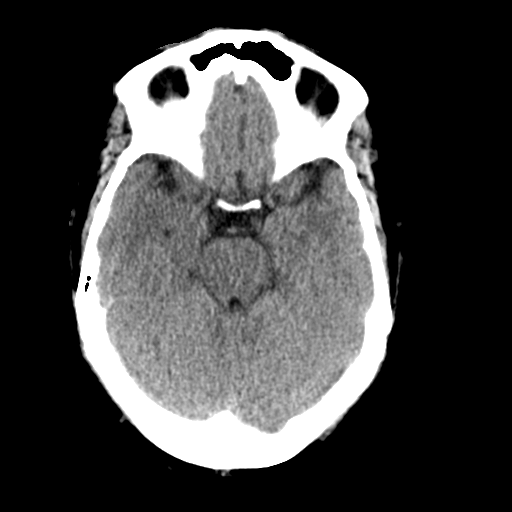
[im 9/32  bone]
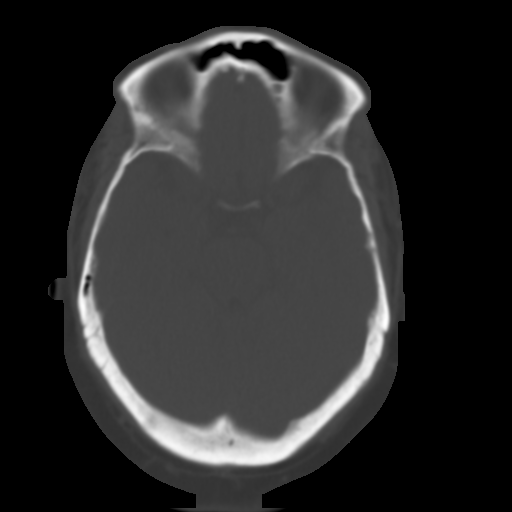
[im 11/32  brain]
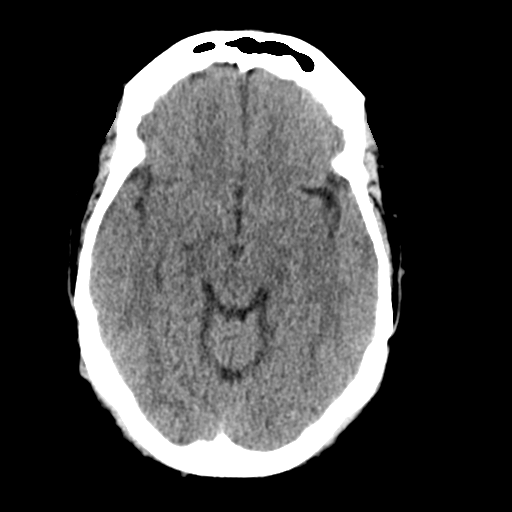
[im 13/32  brain]
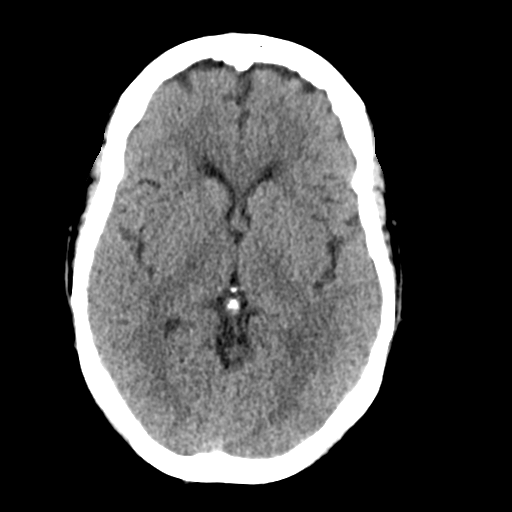
[im 15/32  brain]
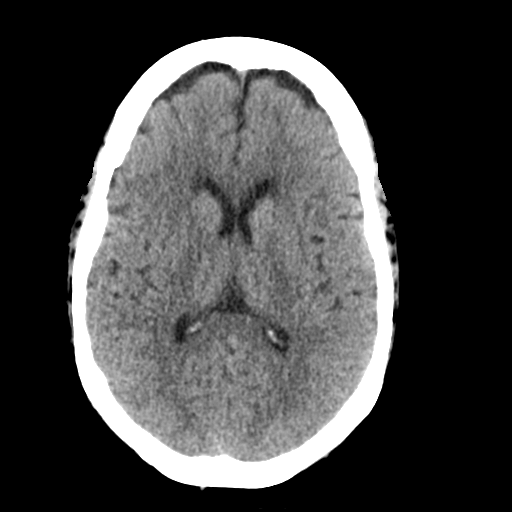
[im 17/32  brain]
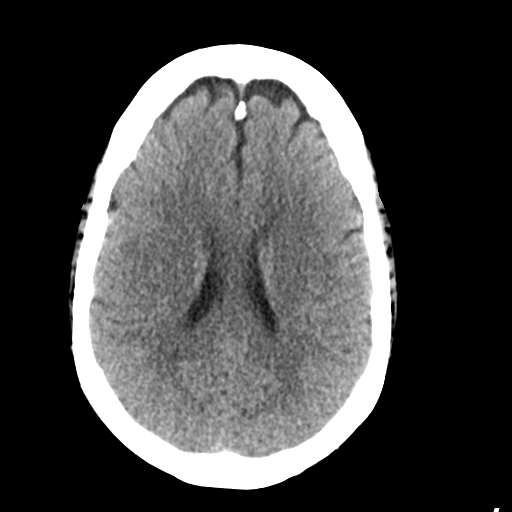
[im 17/32  bone]
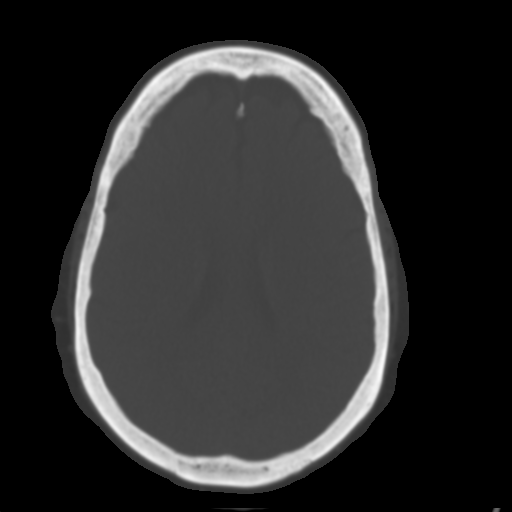
[im 19/32  brain]
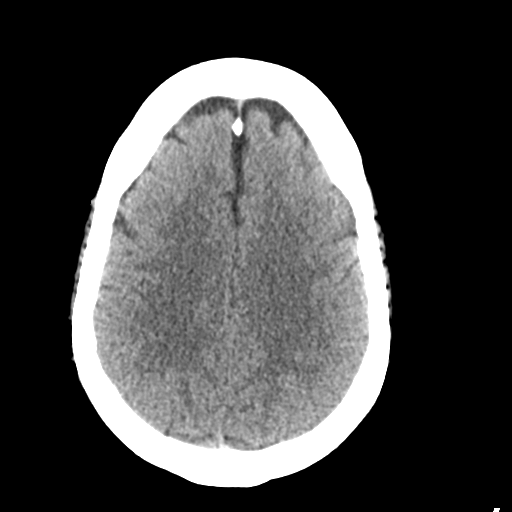
[im 21/32  brain]
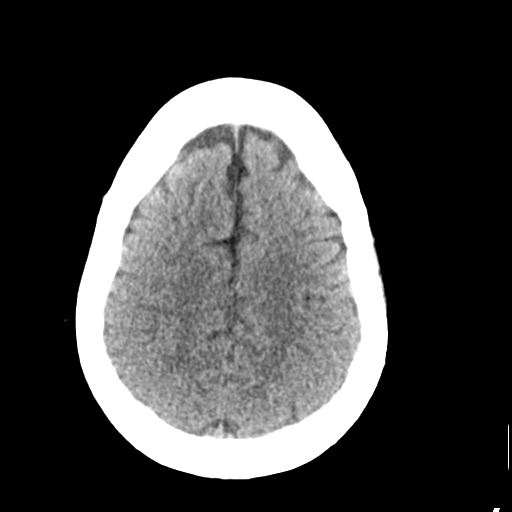
[im 23/32  brain]
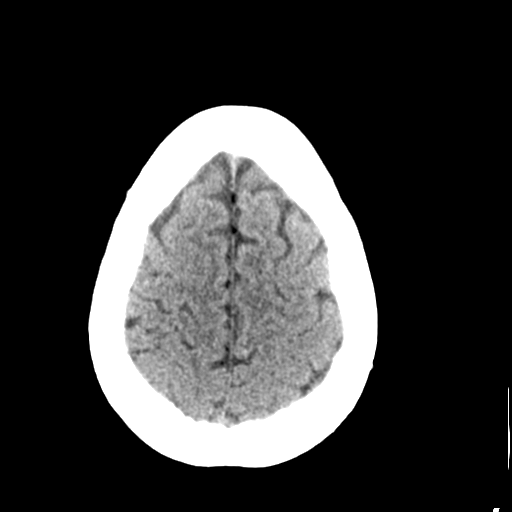
[im 24/32  brain]
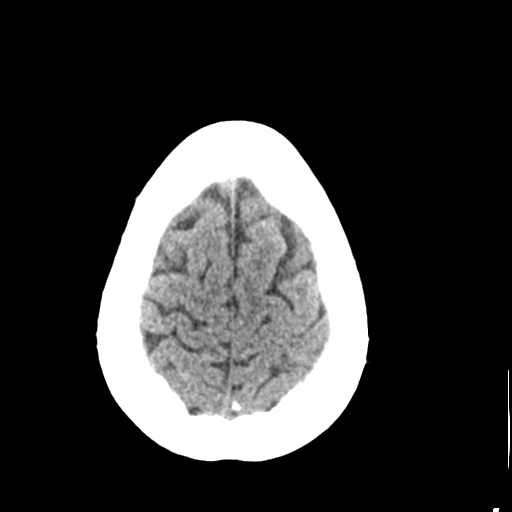
[im 24/32  bone]
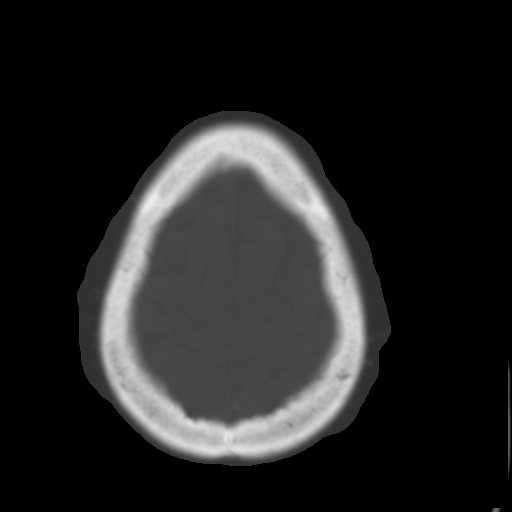
[im 26/32  brain]
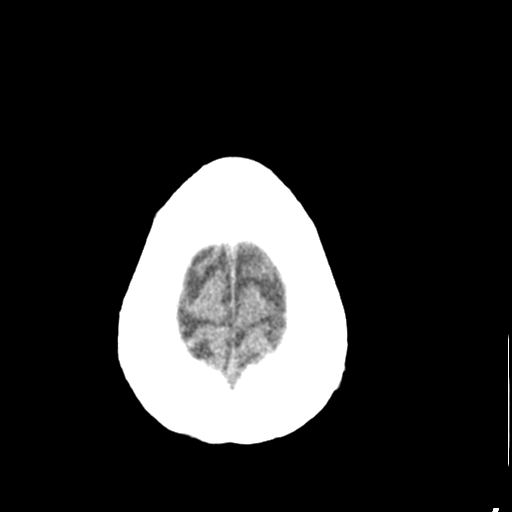
[im 28/32  brain]
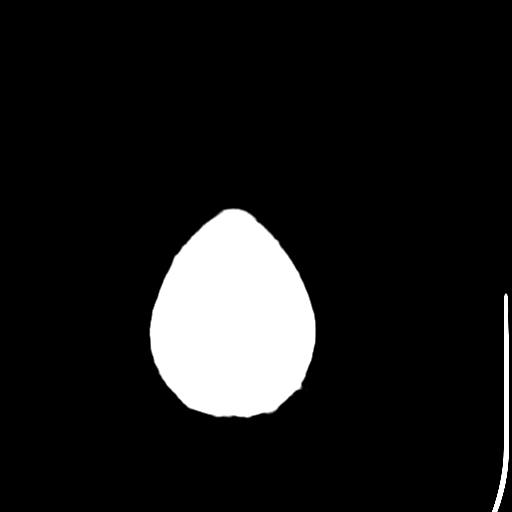
[im 30/32  brain]
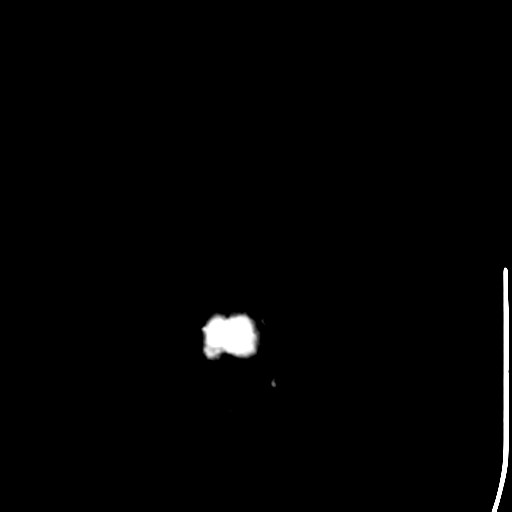

[16 of 30 positions shown; findings below may reference images not displayed]

FINDINGS: Skull and Sinuses:Chronic opacification of the left mastoid tip. No
acute osseous findings.

Orbits: No acute abnormality.

Brain: No evidence of acute abnormality, such as acute infarction,
hemorrhage, hydrocephalus, or mass lesion/mass effect. Stable
pattern of chronic small vessel ischemic change, with mild
low-attenuation around the lateral ventricles.
IMPRESSION: 1. No evidence of acute intracranial disease.
2. Chronic small vessel ischemic white matter disease that is mild,
but age advanced.

## 2015-12-20 ENCOUNTER — Encounter (HOSPITAL_COMMUNITY): Payer: Self-pay | Admitting: Psychiatry

## 2015-12-20 ENCOUNTER — Ambulatory Visit (INDEPENDENT_AMBULATORY_CARE_PROVIDER_SITE_OTHER): Payer: Medicare HMO | Admitting: Psychiatry

## 2015-12-20 VITALS — BP 130/86 | HR 75 | Ht 64.0 in | Wt 253.6 lb

## 2015-12-20 DIAGNOSIS — F200 Paranoid schizophrenia: Secondary | ICD-10-CM

## 2015-12-20 DIAGNOSIS — Z79899 Other long term (current) drug therapy: Secondary | ICD-10-CM

## 2015-12-20 DIAGNOSIS — H52209 Unspecified astigmatism, unspecified eye: Secondary | ICD-10-CM | POA: Diagnosis not present

## 2015-12-20 DIAGNOSIS — H5203 Hypermetropia, bilateral: Secondary | ICD-10-CM | POA: Diagnosis not present

## 2015-12-20 DIAGNOSIS — H5213 Myopia, bilateral: Secondary | ICD-10-CM | POA: Diagnosis not present

## 2015-12-20 DIAGNOSIS — Z7982 Long term (current) use of aspirin: Secondary | ICD-10-CM

## 2015-12-20 DIAGNOSIS — H524 Presbyopia: Secondary | ICD-10-CM | POA: Diagnosis not present

## 2015-12-20 IMAGING — MR MR HEAD W/O CM
9 of 12 series · 32 of 48 positions shown · non-contrast
Comparison: Head CT without contrast [DATE]. Brain MRI 04/25/2011.

CLINICAL DATA: 52-year-old female with increasing seizure activity
and shortness of Breath. Altered mental status. Initial encounter.

EXAM:
MRI HEAD WITHOUT CONTRAST
TECHNIQUE: Multiplanar, multiecho pulse sequences of the brain and surrounding
structures were obtained without intravenous contrast.

[Series 4: DWI · axial · 5.0mm · 1.09mm/px · z∈[-78,+67]mm · 6 of 60 slices shown (1 of 4)]
[im 1/60]
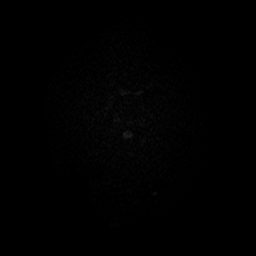
[im 12/60]
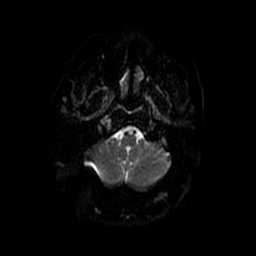
[im 24/60]
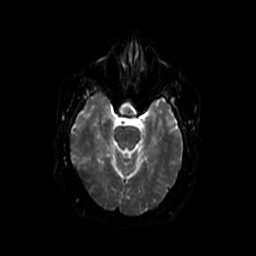
[im 36/60]
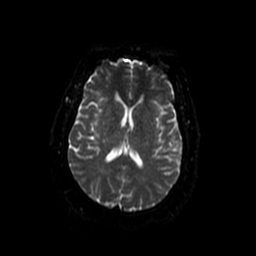
[im 48/60]
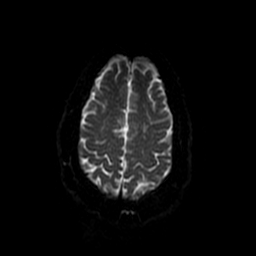
[im 60/60]
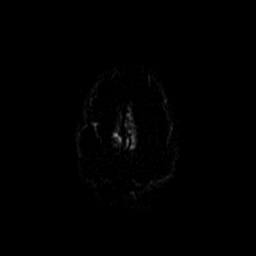

[Series 5: T2 · axial · 5.0mm · 0.43mm/px · z∈[-64,+76]mm · 2 of 21 slices shown (1 of 3)]
[im 1/21]
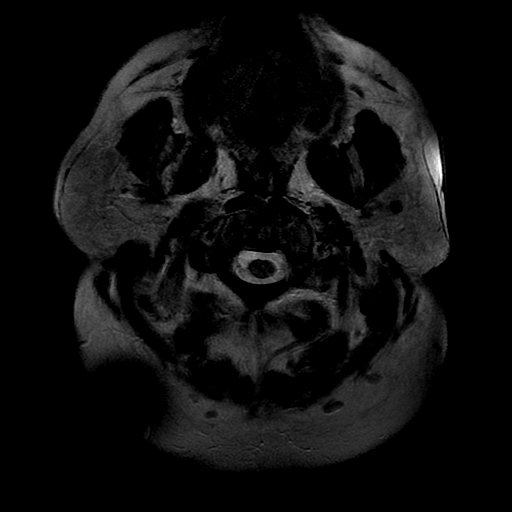
[im 21/21]
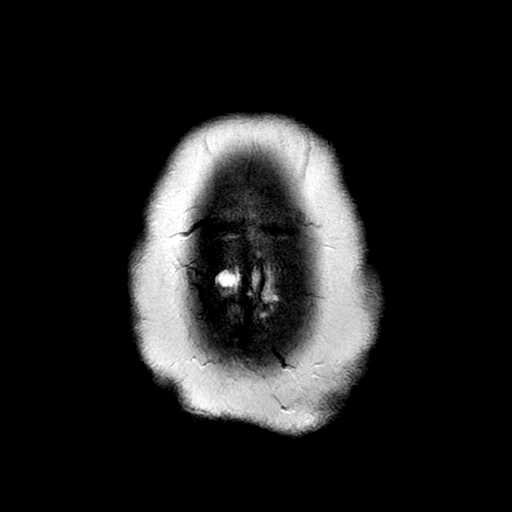

[Series 6: FLAIR · axial · 5.0mm · 0.43mm/px · z∈[-64,+76]mm · 2 of 21 slices shown]
[im 1/21]
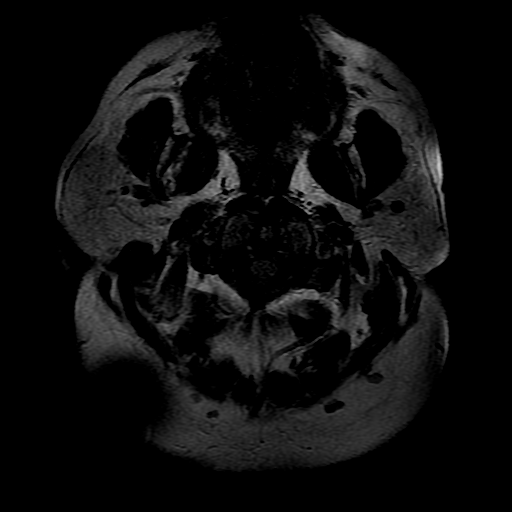
[im 21/21]
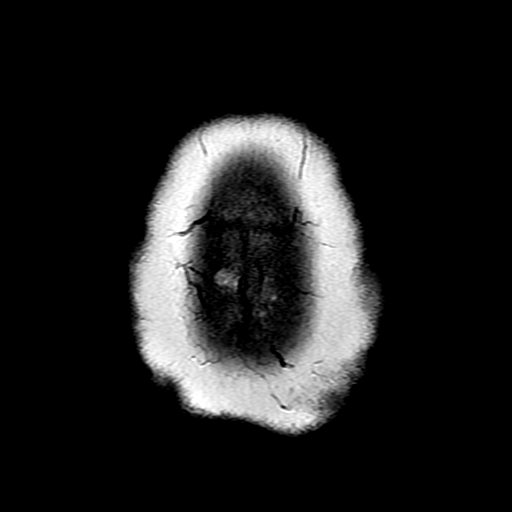

[Series 7: T2 · axial · 5.0mm · 0.43mm/px · z∈[-66,+78]mm · 3 of 25 slices shown (2 of 3)]
[im 1/25]
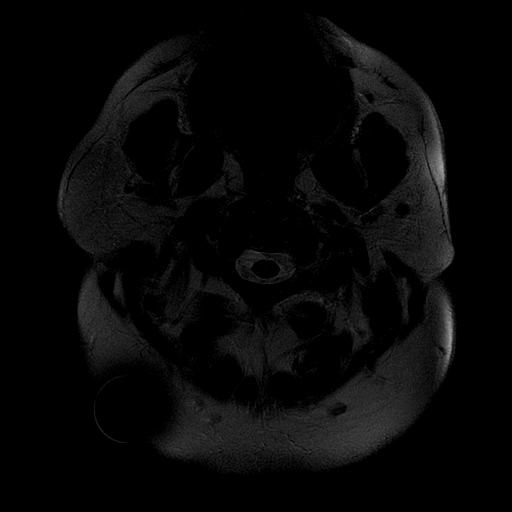
[im 13/25]
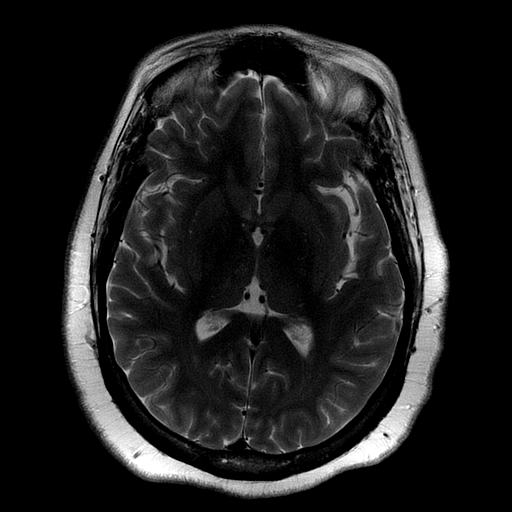
[im 25/25]
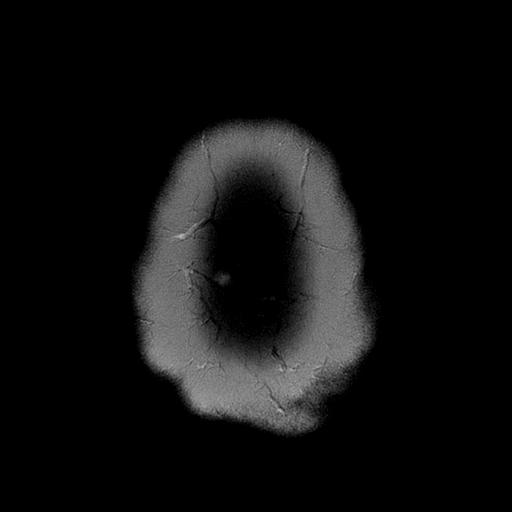

[Series 10: T2 · coronal · 3.0mm · 0.35mm/px · 3 of 28 slices shown (3 of 3)]
[im 1/28]
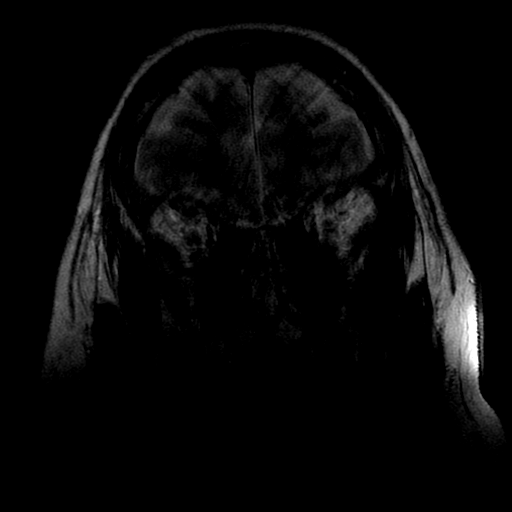
[im 14/28]
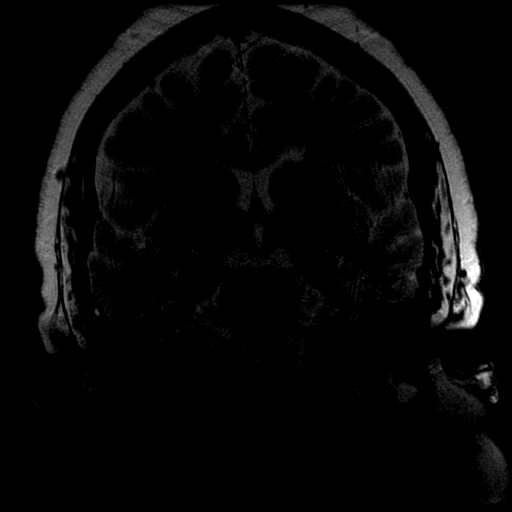
[im 28/28]
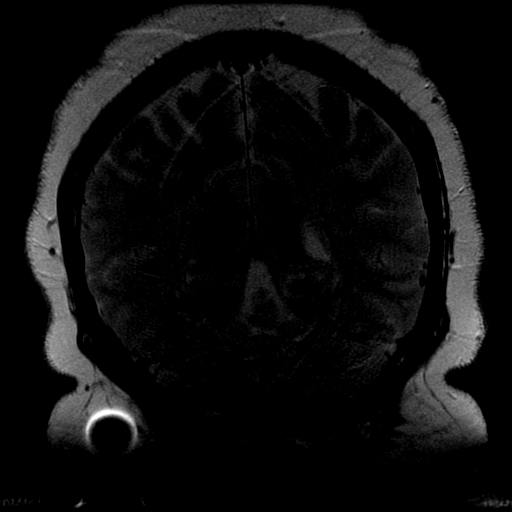

[Series 11: T2 post-contrast · coronal · 5.0mm · 0.39mm/px · 3 of 26 slices shown]
[im 1/26]
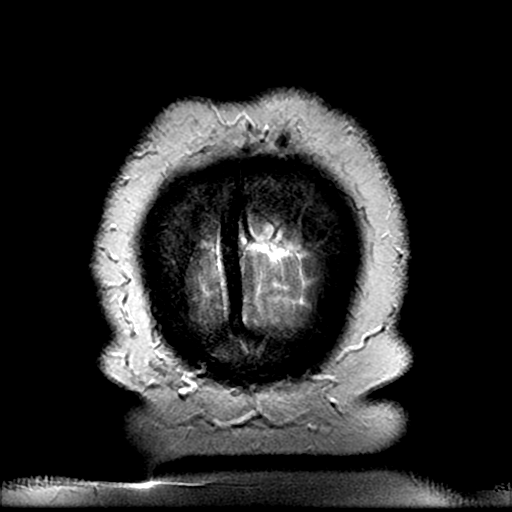
[im 13/26]
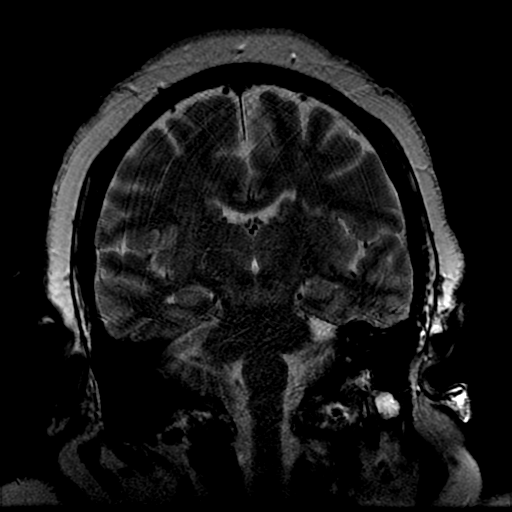
[im 26/26]
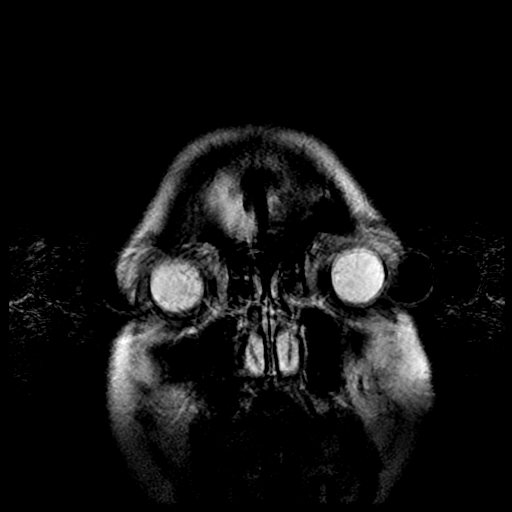

[Series 12: DWI · coronal · 5.0mm · 1.09mm/px · 7 of 68 slices shown (2 of 4)]
[im 1/68]
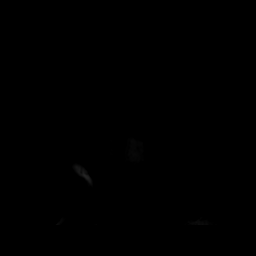
[im 12/68]
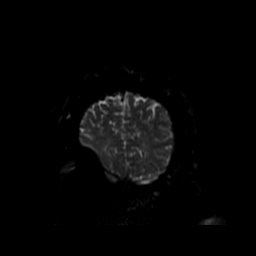
[im 23/68]
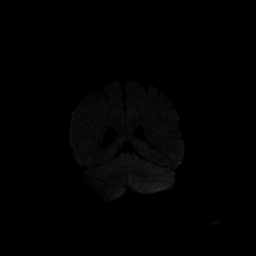
[im 34/68]
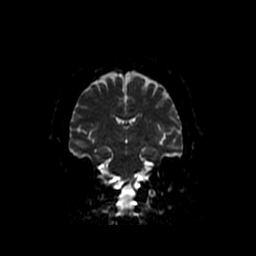
[im 45/68]
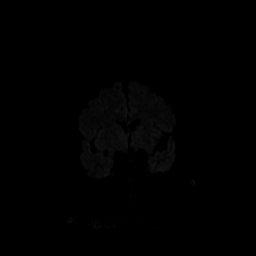
[im 56/68]
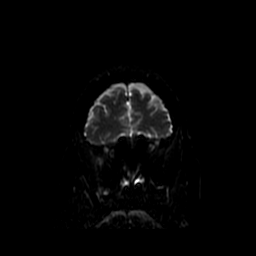
[im 68/68]
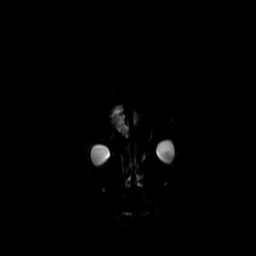

[Series 400: DWI · axial · 5.0mm · 1.09mm/px · z∈[-78,+67]mm · 3 of 30 slices shown (3 of 4)]
[im 1/30]
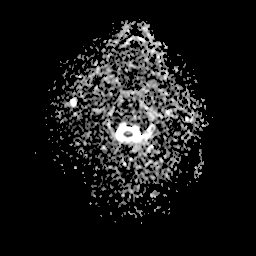
[im 15/30]
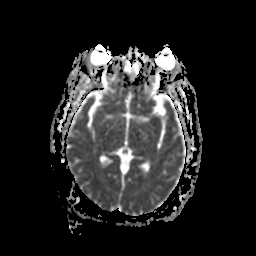
[im 30/30]
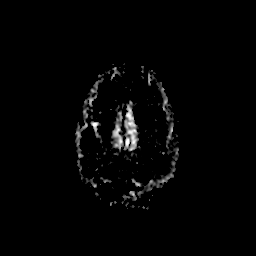

[Series 1200: DWI · coronal · 5.0mm · 1.09mm/px · 3 of 34 slices shown (4 of 4)]
[im 1/34]
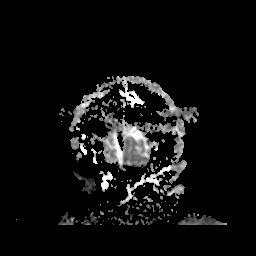
[im 17/34]
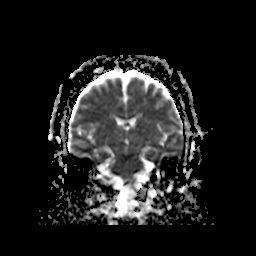
[im 34/34]
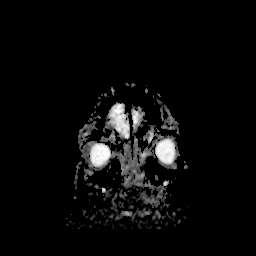

[32 of 48 positions shown; findings below may reference images not displayed]

FINDINGS: Stable cerebral volume, within normal limits for age. Major
intracranial vascular flow voids are stable. No restricted diffusion
to suggest acute infarction. No midline shift, mass effect, evidence
of mass lesion, ventriculomegaly, extra-axial collection or acute
intracranial hemorrhage. Cervicomedullary junction within normal
limits. Chronic partially empty sella configuration. Negative
visualized cervical spine.

Scattered small nonspecific cerebral white matter foci of T2 and
FLAIR hyperintensity are more apparent, but possibly due to less
motion artifact today. Deep gray matter nuclei, brainstem and
cerebellum are within normal limits. Motion artifact on thin coronal
images. No cortical encephalomalacia or temporal lobe/hippocampus
abnormality evident.

Stable bone marrow signal. Visualized orbit soft tissues are within
normal limits. Small volume left mastoid air cell fluid today.
Unchanged small midline Tornwaldt cyst and otherwise negative
visualized nasopharynx. Stable right mastoids. Paranasal sinuses are
clear. Visualized scalp soft tissues are within normal limits,
except for susceptibility artifact in the right suboccipital soft
tissues of unknown etiology.
IMPRESSION: 1.  No acute intracranial abnormality identified.
2. Moderate for age nonspecific white matter signal changes, stable
or mildly increased since [DATE]. Mild left mastoid effusion, significance doubtful.

## 2015-12-20 MED ORDER — MIRTAZAPINE 30 MG PO TABS: 30.0000 mg | ORAL_TABLET | Freq: Every day | ORAL | 1 refills | Status: DC

## 2015-12-20 MED ORDER — ZIPRASIDONE HCL 80 MG PO CAPS
80.0000 mg | ORAL_CAPSULE | Freq: Every day | ORAL | 1 refills | Status: DC
Start: 2015-12-20 — End: 2016-02-22

## 2015-12-20 MED ORDER — ESCITALOPRAM OXALATE 20 MG PO TABS: 20.0000 mg | ORAL_TABLET | Freq: Every day | ORAL | 1 refills | Status: DC

## 2015-12-20 MED ORDER — BENZTROPINE MESYLATE 1 MG PO TABS: 1.0000 mg | ORAL_TABLET | Freq: Every day | ORAL | 1 refills | Status: DC

## 2015-12-20 NOTE — Progress Notes (Signed)
[facility] [id number] Note  Tiffany [name] [id number] 55 y.o.  12/20/2015 10:35 AM  Chief Complaint:  I was again hospitalized due to cocaine use.  I was seeing things.  I'm feeling better now.            History of Present Illness:  Tiffany [name] came for her follow-up appointment.  She relapsed again and hospitalize in November due to cocaine use and causing increased paranoia, hallucination, irritability and she was not safe by herself.  This is her third psychiatric admission in current calendar year due to relapse into cocaine.  She admitted cocaine has been a issue with this time she is very firm about quitting cocaine.  I reviewed discharge summary, she had prolonged QTC and her Geodon was decreased to 80 mg a day.  She had a very good response to the Geodon.  She admitted that she is feeling much better and do not have any urge to use drugs.  She had a very good Thanksgiving.  [name] hoping for had a good Christmas.  [name] more active and trying to wake up early to do things.  She also put the tree and hoping to had a nice getting around Christmas.  [name] concerned about her weight and recently she has gained more weight despite more active.  She admitted some time having shortness of breath and she believe may be retaining fluid.  She saw prostate her primary care physician.  She feels that she does not need CD IOP because she is trying very hard to stay away from cocaine.  She started seeing [name] for counseling which is also helping her coping skills.  [name] less paranoid and denies any recent hallucination or any suicidal thoughts.  She has some tremors in her hand but otherwise she has no concerned from the medication.  I reviewed discharge summary.  [name] not taking amantadine but there were no Cogentin in her regimen.  She sleeping better.  She denies any agitation or any suicidal thoughts.  She lives with her cousins and family members.  Her appetite is okay and her vital  signs are stable.  Patient is smoking and wondering if she can try Chantix which I deferred due to her strong psychiatric illness.  Suicidal Ideation: No Plan Formed: No Patient has means to carry out plan: No  Homicidal Ideation: No Plan Formed: No Patient has means to carry out plan: No   Past Psychiatric History/Hospitalization(s): Patient has multiple psychiatric hospitalization due to relapse into drug use and mental illness.  Her last psychiatric hospitalization was in November 2017 at [facility].  She remember having psychiatric illness started in Apr 03, 1984 when her daughter died due to cancer.  She had tried Abilify, Risperdal , Wellbutrin , Xanax , Klonopin, trazodone, BuSpar in the past.  She was seeing psychiatrist at [facility] .  Patient denies any history of suicidal attempt but endorsed history of suicidal thoughts.  She endorse history of paranoia, delusion, anxiety and manic-like symptoms.  Anxiety: Yes Bipolar Disorder: Yes Depression: Yes Mania: Yes Psychosis: Yes Schizophrenia: Yes Personality Disorder: No Hospitalization for psychiatric illness: Yes History of Electroconvulsive Shock Therapy: No Prior Suicide Attempts: No  Medical History; Patient has high blood pressure, diabetes mellitus, hyperlipidemia, stroke, seasonal allergies, sleep apnea and questionable seizures.  Her primary care physician is [facility].   Review of Systems  Constitutional: Negative for malaise/fatigue.       Weight gain  HENT: Negative.  Respiratory: Positive for shortness of breath. Negative for cough and hemoptysis.   Cardiovascular: Negative for chest pain and palpitations.       Shortness of breath  Gastrointestinal: Negative.   Genitourinary: Negative.   Musculoskeletal: Negative.   Skin: Negative.   Neurological: Positive for tremors. Negative for dizziness, tingling and headaches.       Neuropathy pain    Psychiatric: Agitation: No Hallucination:  No Depressed Mood: No Insomnia: No Hypersomnia: No Altered Concentration: No Feels Worthless: No Grandiose Ideas: No Belief In Special Powers: No New/Increased Substance Abuse: No Compulsions: No  Neurologic: Headache: No Seizure: Patient reported seizures but no clear documentation Paresthesias: Neuropathy pain   Outpatient Encounter Prescriptions as of 12/20/2015  Medication Sig  . ACCU-CHEK SOFTCLIX LANCETS lancets Three times a day before meals.  [name] albuterol (PROVENTIL HFA;VENTOLIN HFA) 108 (90 Base) MCG/ACT inhaler Inhale 2 puffs into the lungs every 6 (six) hours as needed.  [name] amLODipine (NORVASC) 5 MG tablet Take 2 tablets (10 mg total) by mouth daily.  [name] aspirin 81 MG EC tablet Take 1 tablet (81 mg total) by mouth daily. Swallow whole.  [name] atorvastatin (LIPITOR) 40 MG tablet Take 1 tablet (40 mg total) by mouth daily.  . Blood Glucose Monitoring Suppl (ACCU-CHEK AVIVA PLUS) w/Device KIT CHECK BLOOD SUGAR THREE TIMES DAILY  . escitalopram (LEXAPRO) 20 MG tablet Take 1 tablet (20 mg total) by mouth daily.  . fluticasone furoate-vilanterol (BREO ELLIPTA) 100-25 MCG/INH AEPB INHALE 1 PUFF EVERY DAY  . furosemide (LASIX) 40 MG tablet Take 1 tablet (40 mg total) by mouth daily. Take Lasix twice daily from 7/14 until 7/18, then daily  . gabapentin (NEURONTIN) 600 MG tablet Take 1 tablet (600 mg total) by mouth 3 (three) times daily.  [name] glucose blood test strip Use to check blood sugar 3 times daily.  [name] lisinopril (PRINIVIL,ZESTRIL) 40 MG tablet Take 1 tablet (40 mg total) by mouth daily.  . metFORMIN (GLUCOPHAGE-XR) 500 MG 24 hr tablet Take 1 tablet (500 mg total) by mouth 2 (two) times daily.  . mirtazapine (REMERON) 30 MG tablet Take 1 tablet (30 mg total) by mouth at bedtime.  . montelukast (SINGULAIR) 10 MG tablet Take 1 tablet (10 mg total) by mouth at bedtime.  . Multiple Vitamins-Minerals (MULTIVITAMIN WITH MINERALS) tablet Take 1 tablet by mouth daily. Reported on 07/28/2015   . naproxen (NAPROSYN) 500 MG tablet Take 1 tablet (500 mg total) by mouth 3 (three) times daily as needed for mild pain.  [name] omeprazole (PRILOSEC) 40 MG capsule Take 1 capsule (40 mg total) by mouth daily.  [name] oxybutynin (DITROPAN) 5 MG tablet Take 1 tablet (5 mg total) by mouth 3 (three) times daily.  . potassium chloride (K-DUR,KLOR-CON) 10 MEQ tablet Take 1 tablet (10 mEq total) by mouth 2 (two) times daily.  [name] tiotropium (SPIRIVA HANDIHALER) 18 MCG inhalation capsule Place 1 capsule (18 mcg total) into inhaler and inhale daily.  . verapamil (VERELAN PM) 240 MG 24 hr capsule Take 2 capsules (480 mg total) by mouth at bedtime.  . ziprasidone (GEODON) 80 MG capsule Take 1 capsule (80 mg total) by mouth at bedtime.  . [DISCONTINUED] amantadine (SYMMETREL) 100 MG capsule Take 1 capsule (100 mg total) by mouth 2 (two) times daily.  . [DISCONTINUED] escitalopram (LEXAPRO) 20 MG tablet Take 1 tablet (20 mg total) by mouth daily.  . [DISCONTINUED] mirtazapine (REMERON) 30 MG tablet Take 1 tablet (30 mg total) by mouth at bedtime.  . [DISCONTINUED]  ziprasidone (GEODON) 80 MG capsule Take 1 capsule (80 mg total) by mouth at bedtime.  . benztropine (COGENTIN) 1 MG tablet Take 1 tablet (1 mg total) by mouth daily.   No facility-administered encounter medications on file as of 12/20/2015.     Recent Results (from the past 2160 hour(s))  GC/Chlamydia probe amp ([place])not at [facility]     Status: None   Collection Time: 11/24/15 12:00 AM  Result Value Ref Range   Chlamydia Negative     Comment: Normal Reference Range - Negative   Neisseria gonorrhea Negative     Comment: Normal Reference Range - Negative  Urine rapid drug screen (hosp performed)not at [facility]     Status: Abnormal   Collection Time: 11/24/15  8:45 PM  Result Value Ref Range   Opiates NONE DETECTED NONE DETECTED   Cocaine POSITIVE (A) NONE DETECTED   Benzodiazepines NONE DETECTED NONE DETECTED   Amphetamines NONE DETECTED NONE DETECTED    Tetrahydrocannabinol POSITIVE (A) NONE DETECTED   Barbiturates NONE DETECTED NONE DETECTED    Comment:        DRUG SCREEN FOR MEDICAL PURPOSES ONLY.  IF CONFIRMATION IS NEEDED FOR ANY PURPOSE, NOTIFY LAB WITHIN 5 DAYS.        LOWEST DETECTABLE LIMITS FOR URINE DRUG SCREEN Drug Class       Cutoff (ng/mL) Amphetamine      1000 Barbiturate      200 Benzodiazepine   272 Tricyclics       536 Opiates          300 Cocaine          300 THC              50   Urinalysis, Routine w reflex microscopic (not at [facility])     Status: Abnormal   Collection Time: 11/24/15  8:45 PM  Result Value Ref Range   Color, Urine YELLOW YELLOW   APPearance CLEAR CLEAR   Specific Gravity, Urine 1.004 (L) 1.005 - 1.030   pH 6.0 5.0 - 8.0   Glucose, UA NEGATIVE NEGATIVE mg/dL   Hgb urine dipstick TRACE (A) NEGATIVE   Bilirubin Urine NEGATIVE NEGATIVE   Ketones, ur NEGATIVE NEGATIVE mg/dL   Protein, ur NEGATIVE NEGATIVE mg/dL   Nitrite NEGATIVE NEGATIVE   Leukocytes, UA NEGATIVE NEGATIVE  Urine microscopic-add on     Status: Abnormal   Collection Time: 11/24/15  8:45 PM  Result Value Ref Range   Squamous Epithelial / LPF 0-5 (A) NONE SEEN   WBC, UA NONE SEEN 0 - 5 WBC/hpf   RBC / HPF NONE SEEN 0 - 5 RBC/hpf   Bacteria, UA RARE (A) NONE SEEN  Comprehensive metabolic panel     Status: Abnormal   Collection Time: 11/24/15  9:16 PM  Result Value Ref Range   Sodium 126 (L) 135 - 145 mmol/L   Potassium 2.9 (L) 3.5 - 5.1 mmol/L   Chloride 92 (L) 101 - 111 mmol/L   CO2 23 22 - 32 mmol/L   Glucose, Bld 95 65 - 99 mg/dL   BUN 12 6 - 20 mg/dL   Creatinine, Ser 1.65 (H) 0.44 - 1.00 mg/dL   Calcium 8.8 (L) 8.9 - 10.3 mg/dL   Total Protein 6.5 6.5 - 8.1 g/dL   Albumin 3.4 (L) 3.5 - 5.0 g/dL   AST 18 15 - 41 U/L   ALT 12 (L) 14 - 54 U/L   Alkaline Phosphatase 122 38 - 126 U/L  Total Bilirubin 0.9 0.3 - 1.2 mg/dL   GFR calc non Af Amer 34 (L) >60 mL/min   GFR calc Af Amer 39 (L) >60 mL/min    Comment:  (NOTE) The eGFR has been calculated using the CKD EPI equation. This calculation has not been validated in all clinical situations. eGFR's persistently <60 mL/min signify possible Chronic Kidney Disease.    Anion gap 11 5 - 15  Ethanol     Status: None   Collection Time: 11/24/15  9:16 PM  Result Value Ref Range   Alcohol, Ethyl (B) <5 <5 mg/dL    Comment:        LOWEST DETECTABLE LIMIT FOR SERUM ALCOHOL IS 5 mg/dL FOR MEDICAL PURPOSES ONLY   CBC with Diff     Status: Abnormal   Collection Time: 11/24/15  9:16 PM  Result Value Ref Range   WBC 16.2 (H) 4.0 - 10.5 K/uL   RBC 4.23 3.87 - 5.11 MIL/uL   Hemoglobin 11.7 (L) 12.0 - 15.0 g/dL   HCT 34.7 (L) 36.0 - 46.0 %   MCV 82.0 78.0 - 100.0 fL   MCH 27.7 26.0 - 34.0 pg   MCHC 33.7 30.0 - 36.0 g/dL   RDW 16.1 (H) 11.5 - 15.5 %   Platelets 438 (H) 150 - 400 K/uL   Neutrophils Relative % 79 %   Neutro Abs 12.8 (H) 1.7 - 7.7 K/uL   Lymphocytes Relative 15 %   Lymphs Abs 2.4 0.7 - 4.0 K/uL   Monocytes Relative 5 %   Monocytes Absolute 0.8 0.1 - 1.0 K/uL   Eosinophils Relative 1 %   Eosinophils Absolute 0.1 0.0 - 0.7 K/uL   Basophils Relative 0 %   Basophils Absolute 0.0 0.0 - 0.1 K/uL  Salicylate level     Status: None   Collection Time: 11/24/15  9:16 PM  Result Value Ref Range   Salicylate Lvl <7.3 2.8 - 30.0 mg/dL  Acetaminophen level     Status: Abnormal   Collection Time: 11/24/15  9:16 PM  Result Value Ref Range   Acetaminophen (Tylenol), Serum <10 (L) 10 - 30 ug/mL    Comment:        THERAPEUTIC CONCENTRATIONS VARY SIGNIFICANTLY. A RANGE OF 10-30 ug/mL MAY BE AN EFFECTIVE CONCENTRATION FOR MANY PATIENTS. HOWEVER, SOME ARE BEST TREATED AT CONCENTRATIONS OUTSIDE THIS RANGE. ACETAMINOPHEN CONCENTRATIONS >150 ug/mL AT 4 HOURS AFTER INGESTION AND >50 ug/mL AT 12 HOURS AFTER INGESTION ARE OFTEN ASSOCIATED WITH TOXIC REACTIONS.   Brain natriuretic peptide     Status: None   Collection Time: 11/24/15  9:16 PM   Result Value Ref Range   B Natriuretic Peptide 68.9 0.0 - 100.0 pg/mL  CBC     Status: Abnormal   Collection Time: 11/25/15  1:59 AM  Result Value Ref Range   WBC 15.5 (H) 4.0 - 10.5 K/uL   RBC 4.12 3.87 - 5.11 MIL/uL   Hemoglobin 11.1 (L) 12.0 - 15.0 g/dL   HCT 34.1 (L) 36.0 - 46.0 %   MCV 82.8 78.0 - 100.0 fL   MCH 26.9 26.0 - 34.0 pg   MCHC 32.6 30.0 - 36.0 g/dL   RDW 15.5 11.5 - 15.5 %   Platelets 425 (H) 150 - 400 K/uL  Creatinine, serum     Status: Abnormal   Collection Time: 11/25/15  1:59 AM  Result Value Ref Range   Creatinine, Ser 1.44 (H) 0.44 - 1.00 mg/dL   GFR calc non Af Amer 40 (  L) >60 mL/min   GFR calc Af Amer 46 (L) >60 mL/min    Comment: (NOTE) The eGFR has been calculated using the CKD EPI equation. This calculation has not been validated in all clinical situations. eGFR's persistently <60 mL/min signify possible Chronic Kidney Disease.   Magnesium     Status: Abnormal   Collection Time: 11/25/15  1:59 AM  Result Value Ref Range   Magnesium 1.4 (L) 1.7 - 2.4 mg/dL  TSH     Status: Abnormal   Collection Time: 11/25/15  1:59 AM  Result Value Ref Range   TSH 0.191 (L) 0.350 - 4.500 uIU/mL    Comment: Performed by a 3rd Generation assay with a functional sensitivity of <=0.01 uIU/mL.  Troponin I     Status: Abnormal   Collection Time: 11/25/15  1:59 AM  Result Value Ref Range   Troponin I 0.06 (HH) <0.03 ng/mL    Comment: CRITICAL RESULT CALLED TO, READ BACK BY AND VERIFIED WITH: E.DODOO,RN [id number] 11/25/15 M.CAMPBELL   Hemoglobin A1c     Status: None   Collection Time: 11/25/15  1:59 AM  Result Value Ref Range   Hgb A1c MFr Bld 5.6 4.8 - 5.6 %    Comment: (NOTE)         Pre-diabetes: 5.7 - 6.4         Diabetes: >6.4         Glycemic control for adults with diabetes: <7.0    Mean Plasma Glucose 114 mg/dL    Comment: (NOTE) Performed At: [facility] [place], [place] [id number] Lindon Romp MD [id number]   Glucose,  capillary     Status: Abnormal   Collection Time: 11/25/15  6:56 AM  Result Value Ref Range   Glucose-Capillary 126 (H) 65 - 99 mg/dL  Troponin I     Status: None   Collection Time: 11/25/15  7:56 AM  Result Value Ref Range   Troponin I <0.03 <0.03 ng/mL  Basic metabolic panel     Status: Abnormal   Collection Time: 11/25/15  7:56 AM  Result Value Ref Range   Sodium 132 (L) 135 - 145 mmol/L   Potassium 4.2 3.5 - 5.1 mmol/L   Chloride 100 (L) 101 - 111 mmol/L   CO2 24 22 - 32 mmol/L   Glucose, Bld 130 (H) 65 - 99 mg/dL   BUN 8 6 - 20 mg/dL   Creatinine, Ser 1.15 (H) 0.44 - 1.00 mg/dL   Calcium 8.7 (L) 8.9 - 10.3 mg/dL   GFR calc non Af Amer 52 (L) >60 mL/min   GFR calc Af Amer >60 >60 mL/min    Comment: (NOTE) The eGFR has been calculated using the CKD EPI equation. This calculation has not been validated in all clinical situations. eGFR's persistently <60 mL/min signify possible Chronic Kidney Disease.    Anion gap 8 5 - 15  CBC     Status: Abnormal   Collection Time: 11/25/15  7:56 AM  Result Value Ref Range   WBC 12.2 (H) 4.0 - 10.5 K/uL   RBC 4.08 3.87 - 5.11 MIL/uL   Hemoglobin 11.0 (L) 12.0 - 15.0 g/dL   HCT 34.0 (L) 36.0 - 46.0 %   MCV 83.3 78.0 - 100.0 fL   MCH 27.0 26.0 - 34.0 pg   MCHC 32.4 30.0 - 36.0 g/dL   RDW 15.7 (H) 11.5 - 15.5 %   Platelets 420 (H) 150 - 400 K/uL  Osmolality  Status: None   Collection Time: 11/25/15  9:34 AM  Result Value Ref Range   Osmolality 284 275 - 295 mOsm/kg  Glucose, capillary     Status: Abnormal   Collection Time: 11/25/15 11:34 AM  Result Value Ref Range   Glucose-Capillary 115 (H) 65 - 99 mg/dL   Comment 1 Notify RN   Troponin I     Status: None   Collection Time: 11/25/15  2:05 PM  Result Value Ref Range   Troponin I <0.03 <0.03 ng/mL  Basic metabolic panel     Status: Abnormal   Collection Time: 11/25/15  2:05 PM  Result Value Ref Range   Sodium 134 (L) 135 - 145 mmol/L   Potassium 4.1 3.5 - 5.1 mmol/L    Chloride 102 101 - 111 mmol/L   CO2 22 22 - 32 mmol/L   Glucose, Bld 108 (H) 65 - 99 mg/dL   BUN 8 6 - 20 mg/dL   Creatinine, Ser 1.07 (H) 0.44 - 1.00 mg/dL   Calcium 9.1 8.9 - 10.3 mg/dL   GFR calc non Af Amer 57 (L) >60 mL/min   GFR calc Af Amer >60 >60 mL/min    Comment: (NOTE) The eGFR has been calculated using the CKD EPI equation. This calculation has not been validated in all clinical situations. eGFR's persistently <60 mL/min signify possible Chronic Kidney Disease.    Anion gap 10 5 - 15  Magnesium     Status: None   Collection Time: 11/25/15  2:05 PM  Result Value Ref Range   Magnesium 2.2 1.7 - 2.4 mg/dL  HIV antibody     Status: None   Collection Time: 11/25/15  2:05 PM  Result Value Ref Range   HIV Screen 4th Generation wRfx Non Reactive Non Reactive    Comment: (NOTE) Performed At: [facility] [address] [place], [place] [id number] Lindon Romp MD [id number]   RPR     Status: None   Collection Time: 11/25/15  2:05 PM  Result Value Ref Range   RPR Ser Ql Non Reactive Non Reactive    Comment: (NOTE) Performed At: [facility] [place], [place] [id number] Lindon Romp MD [id number]   Sodium, urine, random     Status: None   Collection Time: 11/25/15  3:22 PM  Result Value Ref Range   Sodium, Ur 41 mmol/L  Osmolality, urine     Status: Abnormal   Collection Time: 11/25/15  3:22 PM  Result Value Ref Range   Osmolality, Ur 208 (L) 300 - 900 mOsm/kg  Glucose, capillary     Status: None   Collection Time: 11/25/15  4:34 PM  Result Value Ref Range   Glucose-Capillary 93 65 - 99 mg/dL   Comment 1 Notify RN   Glucose, capillary     Status: None   Collection Time: 11/25/15  9:00 PM  Result Value Ref Range   Glucose-Capillary 98 65 - 99 mg/dL   Comment 1 Notify RN    Comment 2 Document in Chart   Basic metabolic panel     Status: Abnormal   Collection Time: 11/26/15  3:59 AM  Result Value Ref Range   Sodium  140 135 - 145 mmol/L   Potassium 4.1 3.5 - 5.1 mmol/L   Chloride 108 101 - 111 mmol/L   CO2 26 22 - 32 mmol/L   Glucose, Bld 76 65 - 99 mg/dL   BUN 8 6 - 20 mg/dL   Creatinine,  Ser 0.97 0.44 - 1.00 mg/dL   Calcium 8.8 (L) 8.9 - 10.3 mg/dL   GFR calc non Af Amer >60 >60 mL/min   GFR calc Af Amer >60 >60 mL/min    Comment: (NOTE) The eGFR has been calculated using the CKD EPI equation. This calculation has not been validated in all clinical situations. eGFR's persistently <60 mL/min signify possible Chronic Kidney Disease.    Anion gap 6 5 - 15  CBC     Status: Abnormal   Collection Time: 11/26/15  3:59 AM  Result Value Ref Range   WBC 10.6 (H) 4.0 - 10.5 K/uL   RBC 3.65 (L) 3.87 - 5.11 MIL/uL   Hemoglobin 9.9 (L) 12.0 - 15.0 g/dL   HCT 31.3 (L) 36.0 - 46.0 %   MCV 85.8 78.0 - 100.0 fL   MCH 27.1 26.0 - 34.0 pg   MCHC 31.6 30.0 - 36.0 g/dL   RDW 17.0 (H) 11.5 - 15.5 %   Platelets 400 150 - 400 K/uL  Glucose, capillary     Status: None   Collection Time: 11/26/15  6:21 AM  Result Value Ref Range   Glucose-Capillary 87 65 - 99 mg/dL   Comment 1 Notify RN    Comment 2 Document in Chart   Glucose, capillary     Status: None   Collection Time: 11/26/15 11:32 AM  Result Value Ref Range   Glucose-Capillary 83 65 - 99 mg/dL   Comment 1 Notify RN   Glucose, capillary     Status: Abnormal   Collection Time: 11/26/15  4:38 PM  Result Value Ref Range   Glucose-Capillary 105 (H) 65 - 99 mg/dL   Comment 1 Notify RN   Glucose, capillary     Status: Abnormal   Collection Time: 11/26/15  9:11 PM  Result Value Ref Range   Glucose-Capillary 105 (H) 65 - 99 mg/dL   Comment 1 Notify RN    Comment 2 Document in Chart   Basic metabolic panel     Status: Abnormal   Collection Time: 11/27/15  5:33 AM  Result Value Ref Range   Sodium 140 135 - 145 mmol/L   Potassium 4.5 3.5 - 5.1 mmol/L   Chloride 108 101 - 111 mmol/L   CO2 24 22 - 32 mmol/L   Glucose, Bld 117 (H) 65 - 99 mg/dL   BUN 8  6 - 20 mg/dL   Creatinine, Ser 1.02 (H) 0.44 - 1.00 mg/dL   Calcium 9.6 8.9 - 10.3 mg/dL   GFR calc non Af Amer >60 >60 mL/min   GFR calc Af Amer >60 >60 mL/min    Comment: (NOTE) The eGFR has been calculated using the CKD EPI equation. This calculation has not been validated in all clinical situations. eGFR's persistently <60 mL/min signify possible Chronic Kidney Disease.    Anion gap 8 5 - 15  CBC     Status: Abnormal   Collection Time: 11/27/15  5:33 AM  Result Value Ref Range   WBC 12.5 (H) 4.0 - 10.5 K/uL   RBC 4.22 3.87 - 5.11 MIL/uL   Hemoglobin 11.4 (L) 12.0 - 15.0 g/dL   HCT 36.6 36.0 - 46.0 %   MCV 86.7 78.0 - 100.0 fL   MCH 27.0 26.0 - 34.0 pg   MCHC 31.1 30.0 - 36.0 g/dL   RDW 16.8 (H) 11.5 - 15.5 %   Platelets 502 (H) 150 - 400 K/uL  Magnesium     Status: None  Collection Time: 11/27/15  5:33 AM  Result Value Ref Range   Magnesium 1.9 1.7 - 2.4 mg/dL  Glucose, capillary     Status: Abnormal   Collection Time: 11/27/15  6:40 AM  Result Value Ref Range   Glucose-Capillary 153 (H) 65 - 99 mg/dL   Comment 1 Notify RN    Comment 2 Document in Chart   Glucose, capillary     Status: Abnormal   Collection Time: 11/27/15 11:43 AM  Result Value Ref Range   Glucose-Capillary 115 (H) 65 - 99 mg/dL  Glucose, capillary     Status: Abnormal   Collection Time: 11/27/15  4:40 PM  Result Value Ref Range   Glucose-Capillary 131 (H) 65 - 99 mg/dL  Glucose, capillary     Status: Abnormal   Collection Time: 11/27/15  9:23 PM  Result Value Ref Range   Glucose-Capillary 101 (H) 65 - 99 mg/dL   Comment 1 Notify RN    Comment 2 Document in Chart   CBC     Status: Abnormal   Collection Time: 11/28/15  3:22 AM  Result Value Ref Range   WBC 16.7 (H) 4.0 - 10.5 K/uL   RBC 3.81 (L) 3.87 - 5.11 MIL/uL   Hemoglobin 10.3 (L) 12.0 - 15.0 g/dL   HCT 33.1 (L) 36.0 - 46.0 %   MCV 86.9 78.0 - 100.0 fL   MCH 27.0 26.0 - 34.0 pg   MCHC 31.1 30.0 - 36.0 g/dL   RDW 17.3 (H) 11.5 - 15.5  %   Platelets 477 (H) 150 - 400 K/uL  Basic metabolic panel     Status: Abnormal   Collection Time: 11/28/15  3:22 AM  Result Value Ref Range   Sodium 140 135 - 145 mmol/L   Potassium 3.7 3.5 - 5.1 mmol/L    Comment: DELTA CHECK NOTED   Chloride 108 101 - 111 mmol/L   CO2 26 22 - 32 mmol/L   Glucose, Bld 88 65 - 99 mg/dL   BUN 12 6 - 20 mg/dL   Creatinine, Ser 1.08 (H) 0.44 - 1.00 mg/dL   Calcium 9.4 8.9 - 10.3 mg/dL   GFR calc non Af Amer 56 (L) >60 mL/min   GFR calc Af Amer >60 >60 mL/min    Comment: (NOTE) The eGFR has been calculated using the CKD EPI equation. This calculation has not been validated in all clinical situations. eGFR's persistently <60 mL/min signify possible Chronic Kidney Disease.    Anion gap 6 5 - 15  Glucose, capillary     Status: Abnormal   Collection Time: 11/28/15  6:25 AM  Result Value Ref Range   Glucose-Capillary 102 (H) 65 - 99 mg/dL  Glucose, capillary     Status: Abnormal   Collection Time: 11/28/15 11:25 AM  Result Value Ref Range   Glucose-Capillary 101 (H) 65 - 99 mg/dL   Comment 1 Notify RN    Comment 2 Document in Chart   Glucose, capillary     Status: Abnormal   Collection Time: 11/28/15  3:59 PM  Result Value Ref Range   Glucose-Capillary 115 (H) 65 - 99 mg/dL  Glucose, capillary     Status: None   Collection Time: 11/28/15  9:14 PM  Result Value Ref Range   Glucose-Capillary 93 65 - 99 mg/dL   Comment 1 Notify RN    Comment 2 Document in Chart   Basic metabolic panel     Status: Abnormal   Collection Time: 11/29/15  2:48 AM  Result Value Ref Range   Sodium 141 135 - 145 mmol/L   Potassium 3.6 3.5 - 5.1 mmol/L   Chloride 109 101 - 111 mmol/L   CO2 26 22 - 32 mmol/L   Glucose, Bld 114 (H) 65 - 99 mg/dL   BUN 20 6 - 20 mg/dL   Creatinine, Ser 1.11 (H) 0.44 - 1.00 mg/dL   Calcium 9.2 8.9 - 10.3 mg/dL   GFR calc non Af Amer 54 (L) >60 mL/min   GFR calc Af Amer >60 >60 mL/min    Comment: (NOTE) The eGFR has been calculated  using the CKD EPI equation. This calculation has not been validated in all clinical situations. eGFR's persistently <60 mL/min signify possible Chronic Kidney Disease.    Anion gap 6 5 - 15  CBC     Status: Abnormal   Collection Time: 11/29/15  2:48 AM  Result Value Ref Range   WBC 16.3 (H) 4.0 - 10.5 K/uL   RBC 3.74 (L) 3.87 - 5.11 MIL/uL   Hemoglobin 10.1 (L) 12.0 - 15.0 g/dL   HCT 32.8 (L) 36.0 - 46.0 %   MCV 87.7 78.0 - 100.0 fL   MCH 27.0 26.0 - 34.0 pg   MCHC 30.8 30.0 - 36.0 g/dL   RDW 17.2 (H) 11.5 - 15.5 %   Platelets 476 (H) 150 - 400 K/uL  Glucose, capillary     Status: None   Collection Time: 11/29/15  5:42 AM  Result Value Ref Range   Glucose-Capillary 84 65 - 99 mg/dL   Comment 1 Notify RN    Comment 2 Document in Chart   Magnesium     Status: None   Collection Time: 11/29/15  7:25 AM  Result Value Ref Range   Magnesium 1.8 1.7 - 2.4 mg/dL  Glucose, capillary     Status: None   Collection Time: 11/29/15 11:36 AM  Result Value Ref Range   Glucose-Capillary 94 65 - 99 mg/dL  Glucose, capillary     Status: Abnormal   Collection Time: 11/29/15  3:45 PM  Result Value Ref Range   Glucose-Capillary 126 (H) 65 - 99 mg/dL   Comment 1 Notify RN   Glucose, capillary     Status: Abnormal   Collection Time: 11/29/15  9:17 PM  Result Value Ref Range   Glucose-Capillary 131 (H) 65 - 99 mg/dL  CBC     Status: Abnormal   Collection Time: 11/30/15  4:01 AM  Result Value Ref Range   WBC 16.9 (H) 4.0 - 10.5 K/uL   RBC 3.62 (L) 3.87 - 5.11 MIL/uL   Hemoglobin 9.7 (L) 12.0 - 15.0 g/dL   HCT 31.8 (L) 36.0 - 46.0 %   MCV 87.8 78.0 - 100.0 fL   MCH 26.8 26.0 - 34.0 pg   MCHC 30.5 30.0 - 36.0 g/dL   RDW 17.6 (H) 11.5 - 15.5 %   Platelets 478 (H) 150 - 400 K/uL  Basic metabolic panel     Status: None   Collection Time: 11/30/15  4:01 AM  Result Value Ref Range   Sodium 141 135 - 145 mmol/L   Potassium 3.9 3.5 - 5.1 mmol/L   Chloride 111 101 - 111 mmol/L   CO2 25 22 - 32  mmol/L   Glucose, Bld 91 65 - 99 mg/dL   BUN 17 6 - 20 mg/dL   Creatinine, Ser 0.99 0.44 - 1.00 mg/dL   Calcium 9.2 8.9 - 10.3 mg/dL  GFR calc non Af Amer >60 >60 mL/min   GFR calc Af Amer >60 >60 mL/min    Comment: (NOTE) The eGFR has been calculated using the CKD EPI equation. This calculation has not been validated in all clinical situations. eGFR's persistently <60 mL/min signify possible Chronic Kidney Disease.    Anion gap 5 5 - 15  Glucose, capillary     Status: None   Collection Time: 11/30/15  6:06 AM  Result Value Ref Range   Glucose-Capillary 83 65 - 99 mg/dL  Glucose, capillary     Status: None   Collection Time: 11/30/15 11:15 AM  Result Value Ref Range   Glucose-Capillary 98 65 - 99 mg/dL  Glucose, capillary     Status: None   Collection Time: 11/30/15  5:03 PM  Result Value Ref Range   Glucose-Capillary 96 65 - 99 mg/dL   Comment 1 Notify RN   Glucose, capillary     Status: None   Collection Time: 11/30/15  8:32 PM  Result Value Ref Range   Glucose-Capillary 98 65 - 99 mg/dL   Comment 1 Notify RN    Comment 2 Document in Chart   Glucose, capillary     Status: None   Collection Time: 12/01/15  5:54 AM  Result Value Ref Range   Glucose-Capillary 80 65 - 99 mg/dL   Comment 1 Notify RN    Comment 2 Document in Chart   Glucose, capillary     Status: None   Collection Time: 12/01/15 10:24 PM  Result Value Ref Range   Glucose-Capillary 87 65 - 99 mg/dL  Glucose, capillary     Status: None   Collection Time: 12/02/15  6:53 AM  Result Value Ref Range   Glucose-Capillary 92 65 - 99 mg/dL  Glucose, capillary     Status: None   Collection Time: 12/02/15 11:53 AM  Result Value Ref Range   Glucose-Capillary 93 65 - 99 mg/dL  Glucose, capillary     Status: None   Collection Time: 12/02/15  4:46 PM  Result Value Ref Range   Glucose-Capillary 88 65 - 99 mg/dL  Glucose, capillary     Status: None   Collection Time: 12/02/15  8:41 PM  Result Value Ref Range    Glucose-Capillary 82 65 - 99 mg/dL   Comment 1 Notify RN   Glucose, capillary     Status: Abnormal   Collection Time: 12/03/15  6:41 AM  Result Value Ref Range   Glucose-Capillary 100 (H) 65 - 99 mg/dL   Comment 1 Notify RN   Glucose, capillary     Status: Abnormal   Collection Time: 12/03/15 12:05 PM  Result Value Ref Range   Glucose-Capillary 113 (H) 65 - 99 mg/dL  Glucose, capillary     Status: None   Collection Time: 12/03/15  4:57 PM  Result Value Ref Range   Glucose-Capillary 88 65 - 99 mg/dL  Glucose, capillary     Status: None   Collection Time: 12/03/15  8:53 PM  Result Value Ref Range   Glucose-Capillary 93 65 - 99 mg/dL  Lipid panel     Status: None   Collection Time: 12/04/15  6:35 AM  Result Value Ref Range   Cholesterol 135 0 - 200 mg/dL   Triglycerides 90 <150 mg/dL   HDL 55 >40 mg/dL   Total CHOL/HDL Ratio 2.5 RATIO   VLDL 18 0 - 40 mg/dL   LDL Cholesterol 62 0 - 99 mg/dL    Comment:  Total Cholesterol/HDL:CHD Risk Coronary Heart Disease Risk Table                     Men   Women  1/2 Average Risk   3.4   3.3  Average Risk       5.0   4.4  2 X Average Risk   9.6   7.1  3 X Average Risk  23.4   11.0        Use the calculated Patient Ratio above and the CHD Risk Table to determine the patient's CHD Risk.        ATP III CLASSIFICATION (LDL):  <100     mg/dL   Optimal  100-129  mg/dL   Near or Above                    Optimal  130-159  mg/dL   Borderline  160-189  mg/dL   High  >190     mg/dL   Very High   Thyroid Panel With TSH     Status: None   Collection Time: 12/04/15  6:35 AM  Result Value Ref Range   TSH 1.060 0.450 - 4.500 uIU/mL   T4, Total 6.0 4.5 - 12.0 ug/dL   T3 Uptake Ratio 29 24 - 39 %   Free Thyroxine Index 1.7 1.2 - 4.9    Comment: (NOTE) Performed At: [facility] [address] [place], [place] [id number] Lindon Romp MD WG:[id number]   Glucose, capillary     Status: None   Collection Time: 12/04/15 12:08  PM  Result Value Ref Range   Glucose-Capillary 76 65 - 99 mg/dL  Glucose, capillary     Status: None   Collection Time: 12/04/15  4:45 PM  Result Value Ref Range   Glucose-Capillary 91 65 - 99 mg/dL   Comment 1 Notify RN    Comment 2 Document in Chart   Glucose, capillary     Status: None   Collection Time: 12/04/15  9:38 PM  Result Value Ref Range   Glucose-Capillary 98 65 - 99 mg/dL  Glucose, capillary     Status: None   Collection Time: 12/05/15  6:13 AM  Result Value Ref Range   Glucose-Capillary 80 65 - 99 mg/dL  Glucose, capillary     Status: None   Collection Time: 12/05/15 12:09 PM  Result Value Ref Range   Glucose-Capillary 99 65 - 99 mg/dL  Glucose, capillary     Status: None   Collection Time: 12/05/15  4:37 PM  Result Value Ref Range   Glucose-Capillary 88 65 - 99 mg/dL   Comment 1 Notify RN    Comment 2 Document in Chart   Glucose, capillary     Status: Abnormal   Collection Time: 12/05/15  8:24 PM  Result Value Ref Range   Glucose-Capillary 110 (H) 65 - 99 mg/dL  Glucose, capillary     Status: None   Collection Time: 12/06/15  6:36 AM  Result Value Ref Range   Glucose-Capillary 87 65 - 99 mg/dL  Glucose, capillary     Status: None   Collection Time: 12/06/15 11:57 AM  Result Value Ref Range   Glucose-Capillary 84 65 - 99 mg/dL   Comment 1 Notify RN   BASIC METABOLIC PANEL WITH GFR     Status: Abnormal   Collection Time: 12/16/15 12:29 PM  Result Value Ref Range   Sodium 142 135 - 146 mmol/L   Potassium 4.2  3.5 - 5.3 mmol/L   Chloride 105 98 - 110 mmol/L   CO2 28 20 - 31 mmol/L   Glucose, Bld 78 65 - 99 mg/dL   BUN 20 7 - 25 mg/dL   Creat 1.50 (H) 0.50 - 1.05 mg/dL    Comment:   For patients > or = 56 years of age: The upper reference limit for Creatinine is approximately 13% higher for people identified as African-American.      Calcium 9.0 8.6 - 10.4 mg/dL   GFR, Est African American 45 (L) >=60 mL/min   GFR, Est Non African American 39 (L)  >=60 mL/min      Constitutional:  BP 130/86 (BP Location: Left Arm, Patient Position: Sitting, Cuff Size: Normal)   Pulse 75   Ht '5\' 4"'  (1.626 m)   Wt 253 lb 9.6 oz (115 kg)   LMP  (Exact Date)   BMI 43.53 kg/m    Musculoskeletal: Strength & Muscle Tone: within normal limits Gait & Station: normal Patient leans: N/A  Mental status examination; Patient is casually dressed and groomed.  She maintained fair eye contact.  She feel somewhat anxious when she was talking about cocaine use.  Her speech is slow but clear, coherent, normal tone and volume.  She denies any auditory or visual hallucination.  She denies any active or passive suicidal thoughts or homicidal thought.  There were no delusions, paranoia or any obsessive thoughts.  There were no flight of ideas or any loose association.  Her attention and concentration is fair.  Her cognition is good.  Her fund of knowledge is adequate.  Her psychomotor activity is slow.  There are mild tremors in her hand.  [name] alert and oriented 3.  Her insight judgment and pulse control is fair.  Established Problem, Stable/Improving (1), Review or order clinical lab tests (1), Review and summation of old records (2), Review of Last Therapy Session (1), Review of Medication Regimen & Side Effects (2) and Review of New Medication or Change in Dosage (2)  Assessment: Axis I: Paranoid schizophrenia chronic .  Anxiety disorder NOS.  Cocaine abuse.    Axis II: Deferred  Axis III:  Past Medical History:  Diagnosis Date  . Anxiety   . Asthma   . Cocaine abuse   . Complication of anesthesia    decreased bp, decreased heart rate  . Diabetes mellitus    class 2  . Disorder of nervous system   . Emphysema   . Heart attack   . High blood pressure   . High cholesterol   . Hypertension   . Incontinence   . Manic depression ([place])   . Myocardial infarct   . Pneumonia 06/2015  . Seasonal allergies   . Seizures ([place])   . Sinus trouble   .  Sleep apnea   . Stroke [facility])      Plan:  I review recent discharge summary, current medication, recent blood work results.  [name] taking amantadine.  She has mild tremors.  I recommended to try Cogentin 0.5 mg at bedtime to help those tremors.  I would discontinue amantadine as patient is trying to stop cocaine on her own.  Due to prolonged QTC her Geodon was reduced and she is only taking 80 mg.  Continue Lexapro 20 mg and Remeron 30 mg.  I also discussed that she has shortness of breath, weight gain and her creatinine is 1.50, she should consult her primary care physician for these symptoms.  Patient talked about Chantix that if she want to try but I will defer as patient has history of paranoia and mood symptoms and Chantix may cause exacerbation of the symptoms.  I encouraged to see [name] for coping and social skills.  One more time I offered CD IOP but patient is feeling better and she things that she can stop the cocaine on her own.  Her last cocaine use was before admission and since then she claims to be sober.  Recommended to call us back if she has any question, concern if she feels worsening of the symptom.  Follow-up in 2 months.Discussed medication side effects and benefits.  Recommended to call us back if there is any question, concern or worsening of the symptoms.  Discuss safety plan that anytime having active suicidal thoughts or homicidal thoughts and she need to call 911 or go to the local emergency room.   ARFEEN,SYED T., MD 12/20/2015

## 2015-12-23 ENCOUNTER — Other Ambulatory Visit: Payer: Self-pay | Admitting: Family Medicine

## 2015-12-24 ENCOUNTER — Other Ambulatory Visit: Payer: Self-pay | Admitting: *Deleted

## 2015-12-24 NOTE — Patient Outreach (Addendum)
[facility]) Care Management  12/24/2015  Tiffany [name] 02-Feb-1960 [id number]   Weekly transition of care call placed to member, unsuccessful.  HIPAA compliant voice message left, will await call back.  Will follow up next week.   Update @ 3pm:  Call received back from member.  She state that she has followed up with her primary MD and her psychiatrist.  She voices concern for weight gain and fluid buildup.  She denies strictly following heart healthy diet, stating "I drink a lot of sodas but I don't think that has anything to do with it."  She also still complains of shortness of breath.  She confirms that she did not follow up with cardiologist as advised several months ago.  [name] advised to contact her primary MD to inquire about another referral.  She also denies monitoring daily weights, but report that she has been compliant with her medications, including Lasix.  [name] again advised to discuss seeing a cardiologist with her primary MD.  She verbalizes understanding.  [name] reminded that she has her pulmonology appointment next week.  She state "I remember."  She denies any other concerns.  Will follow up next week.  [name], [place], MSN [place] [phone]

## 2015-12-25 DIAGNOSIS — R0602 Shortness of breath: Secondary | ICD-10-CM | POA: Diagnosis not present

## 2015-12-25 DIAGNOSIS — J45909 Unspecified asthma, uncomplicated: Secondary | ICD-10-CM | POA: Diagnosis not present

## 2015-12-25 DIAGNOSIS — R062 Wheezing: Secondary | ICD-10-CM | POA: Diagnosis not present

## 2015-12-25 DIAGNOSIS — G4733 Obstructive sleep apnea (adult) (pediatric): Secondary | ICD-10-CM | POA: Diagnosis not present

## 2015-12-25 DIAGNOSIS — J189 Pneumonia, unspecified organism: Secondary | ICD-10-CM | POA: Diagnosis not present

## 2015-12-25 DIAGNOSIS — J440 Chronic obstructive pulmonary disease with acute lower respiratory infection: Secondary | ICD-10-CM | POA: Diagnosis not present

## 2015-12-30 ENCOUNTER — Ambulatory Visit (INDEPENDENT_AMBULATORY_CARE_PROVIDER_SITE_OTHER): Payer: Commercial Managed Care - HMO | Admitting: Emergency Medicine

## 2015-12-30 ENCOUNTER — Encounter: Payer: Self-pay | Admitting: Emergency Medicine

## 2015-12-30 DIAGNOSIS — J449 Chronic obstructive pulmonary disease, unspecified: Secondary | ICD-10-CM

## 2015-12-30 NOTE — Patient Instructions (Addendum)
We will work on decreasing your cigarettes.  Please continue your Breo and Spiriva as you are taking them  Take albuterol 2 puffs up to every 4 hours if needed for shortness of breath.  We will arrange to perform full pulmonary function testing  We will need to get you back on your CPAP at some point in the future.  Follow with Dr Lamonte Sakai in 4 months with full PFT or sooner if you have any problems.

## 2015-12-30 NOTE — Addendum Note (Signed)
Addended by: Benson Setting L on: 12/30/2015 05:02 PM   Modules accepted: Orders

## 2015-12-30 NOTE — Assessment & Plan Note (Signed)
Suspect she has significant COPD given her tobacco history and symptoms see has described. She notes that she has been hypercapnic at times on hospitalization, has required noninvasive positive pressure ventilation. Currently managed on Spiriva and Breo, albuterol prn. Underscored that she should not use > q4h prn. She needs PFT and we will do these at her ROV. Discussed smoking cessation - she believe that she will want to stop, knows she needs to. [name] not ready to do it at this time. We will revisit.

## 2015-12-30 NOTE — Progress Notes (Signed)
Subjective:    Patient ID: Tiffany [name], female    DOB: [birth year], 55 y.o.   MRN: [id number]  HPI 55 year old active smoker (18 pack years), with a history of schizophrenia, childhood asthma, substance abuse including cocaine, tobacco use and suspected COPD apparently with asthmatic features based on past clinical history. She has been able to stay off cocaine since her last hospitalization.  She was dx w COPD in her 49's. Currently managed on Spiriva and Breo. She uses albuterol about 3-4x a day. She believes that it helps w dyspnea and wheeze. She has cough, much less than in recent months. She has an acute flare about once a year, has been rx with abx and pred before. She has OSA but does not use CPAP, trouble tolerating the mask.    Review of Systems  Constitutional: Positive for unexpected weight change. Negative for fever.  HENT: Positive for dental problem and sinus pressure. Negative for congestion, ear pain, nosebleeds, postnasal drip, rhinorrhea, sneezing, sore throat and trouble swallowing.   Eyes: Negative.  Negative for redness and itching.  Respiratory: Positive for cough, shortness of breath and wheezing. Negative for chest tightness.   Cardiovascular: Positive for leg swelling. Negative for palpitations.  Gastrointestinal: Negative.  Negative for nausea and vomiting.  Endocrine: Negative.   Genitourinary: Negative.  Negative for dysuria.  Musculoskeletal: Positive for joint swelling.  Skin: Negative.  Negative for rash.  Allergic/Immunologic: Negative.   Neurological: Negative.  Negative for headaches.  Hematological: Bruises/bleeds easily.  Psychiatric/Behavioral: Negative.  Negative for dysphoric mood. The patient is not nervous/anxious.    Past Medical History:  Diagnosis Date  . Anxiety   . Asthma   . Cocaine abuse   . Complication of anesthesia    decreased bp, decreased heart rate  . Diabetes mellitus    class 2  . Disorder of nervous system   . Emphysema    . Heart attack   . High blood pressure   . High cholesterol   . Hypertension   . Incontinence   . Manic depression ([place])   . Myocardial infarct   . Pneumonia 06/2015  . Seasonal allergies   . Seizures ([place])   . Sinus trouble   . Sleep apnea   . Stroke [facility])      Family History  Problem Relation Age of Onset  . Cancer Father     prostate  . Cancer Mother     lung  . Depression Mother   . Depression Sister   . Anxiety disorder Sister   . Schizophrenia Sister   . Bipolar disorder Sister   . Depression Sister   . Depression Brother   . Heart failure      cousin     Social History   Social History  . Marital status: Widowed    Spouse name: N/A  . Number of children: 3  . Years of education: N/A   Occupational History  . disabled     factory production   Social History Main Topics  . Smoking status: Current Every Day Smoker    Packs/day: 0.50    Years: 37.00    Types: Cigarettes    Start date: 01/16/1977  . Smokeless tobacco: Never Used     Comment: Previous 2 PPD menthol full flavor  . Alcohol use No  . Drug use: No  . Sexual activity: Not Currently    Birth control/ protection: Surgical   Other Topics Concern  . Not on file  Social History Narrative  . No narrative on file     Allergies  Allergen Reactions  . Hydroxyzine Other (See Comments)    unknown  . Codeine Nausea And Vomiting  . Sulfa Antibiotics Itching     Outpatient Medications Prior to Visit  Medication Sig Dispense Refill  . ACCU-CHEK SOFTCLIX LANCETS lancets TEST FOUR TIMES DAILY 400 each 12  . albuterol (PROVENTIL HFA;VENTOLIN HFA) 108 (90 Base) MCG/ACT inhaler Inhale 2 puffs into the lungs every 6 (six) hours as needed. 1 Inhaler 6  . amLODipine (NORVASC) 5 MG tablet Take 2 tablets (10 mg total) by mouth daily. 90 tablet 1  . aspirin 81 MG EC tablet Take 1 tablet (81 mg total) by mouth daily. Swallow whole. 30 tablet 1  . atorvastatin (LIPITOR) 40 MG tablet Take 1 tablet (40  mg total) by mouth daily. 30 tablet 1  . benztropine (COGENTIN) 1 MG tablet Take 1 tablet (1 mg total) by mouth daily. 30 tablet 1  . Blood Glucose Monitoring Suppl (ACCU-CHEK AVIVA PLUS) w/Device KIT CHECK BLOOD SUGAR THREE TIMES DAILY 1 kit 0  . escitalopram (LEXAPRO) 20 MG tablet Take 1 tablet (20 mg total) by mouth daily. 30 tablet 1  . fluticasone furoate-vilanterol (BREO ELLIPTA) 100-25 MCG/INH AEPB INHALE 1 PUFF EVERY DAY 180 each 0  . furosemide (LASIX) 40 MG tablet Take 1 tablet (40 mg total) by mouth daily. Take Lasix twice daily from 7/14 until 7/18, then daily 30 tablet 0  . gabapentin (NEURONTIN) 600 MG tablet Take 1 tablet (600 mg total) by mouth 3 (three) times daily. 90 tablet 1  . glucose blood test strip Use to check blood sugar 3 times daily. 100 each 12  . lisinopril (PRINIVIL,ZESTRIL) 40 MG tablet Take 1 tablet (40 mg total) by mouth daily. 30 tablet 1  . metFORMIN (GLUCOPHAGE-XR) 500 MG 24 hr tablet Take 1 tablet (500 mg total) by mouth 2 (two) times daily. 60 tablet 1  . mirtazapine (REMERON) 30 MG tablet Take 1 tablet (30 mg total) by mouth at bedtime. 30 tablet 1  . montelukast (SINGULAIR) 10 MG tablet Take 1 tablet (10 mg total) by mouth at bedtime. 30 tablet 1  . Multiple Vitamins-Minerals (MULTIVITAMIN WITH MINERALS) tablet Take 1 tablet by mouth daily. Reported on 07/28/2015 30 tablet 1  . naproxen (NAPROSYN) 500 MG tablet Take 1 tablet (500 mg total) by mouth 3 (three) times daily as needed for mild pain. 90 tablet 1  . omeprazole (PRILOSEC) 40 MG capsule Take 1 capsule (40 mg total) by mouth daily. 30 capsule 1  . oxybutynin (DITROPAN) 5 MG tablet Take 1 tablet (5 mg total) by mouth 3 (three) times daily. 90 tablet 1  . potassium chloride (K-DUR,KLOR-CON) 10 MEQ tablet Take 1 tablet (10 mEq total) by mouth 2 (two) times daily. 180 tablet 1  . tiotropium (SPIRIVA HANDIHALER) 18 MCG inhalation capsule Place 1 capsule (18 mcg total) into inhaler and inhale daily. 90  capsule 1  . verapamil (VERELAN PM) 240 MG 24 hr capsule Take 2 capsules (480 mg total) by mouth at bedtime. 60 capsule 0  . ziprasidone (GEODON) 80 MG capsule Take 1 capsule (80 mg total) by mouth at bedtime. 30 capsule 1   No facility-administered medications prior to visit.         Objective:   Physical Exam Vitals:   12/30/15 1607  BP: 114/68  Pulse: 82  SpO2: 93%  Weight: 251 lb 12.8 oz (114.2 kg)  Height:  5' 4" (1.626 m)   Gen: Pleasant, overwt, in no distress,  normal affect  ENT: No lesions,  mouth clear,  oropharynx clear, no postnasal drip  Neck: No JVD, no TMG, no carotid bruits  Lungs: No use of accessory muscles, clear without rales or rhonchi  Cardiovascular: RRR, heart sounds normal, no murmur or gallops, no peripheral edema  Musculoskeletal: No deformities, no cyanosis or clubbing  Neuro: alert, non focal  Skin: Warm, no lesions or rashes       Assessment & Plan:  COPD (chronic obstructive pulmonary disease) ([place]) Suspect she has significant COPD given her tobacco history and symptoms see has described. She notes that she has been hypercapnic at times on hospitalization, has required noninvasive positive pressure ventilation. Currently managed on Spiriva and Breo, albuterol prn. Underscored that she should not use > q4h prn. She needs PFT and we will do these at her ROV. Discussed smoking cessation - she believe that she will want to stop, knows she needs to. [name] not ready to do it at this time. We will revisit.   Baltazar Apo, MD, PhD 12/30/2015, 4:30 PM [facility] [phone] or if no answer [phone]

## 2015-12-31 ENCOUNTER — Ambulatory Visit (INDEPENDENT_AMBULATORY_CARE_PROVIDER_SITE_OTHER): Payer: Commercial Managed Care - HMO | Admitting: Student

## 2015-12-31 ENCOUNTER — Other Ambulatory Visit: Payer: Self-pay | Admitting: *Deleted

## 2015-12-31 ENCOUNTER — Encounter: Payer: Self-pay | Admitting: Student

## 2015-12-31 VITALS — BP 110/60 | HR 75 | Temp 99.0°F | Wt 252.0 lb

## 2015-12-31 DIAGNOSIS — K625 Hemorrhage of anus and rectum: Secondary | ICD-10-CM | POA: Insufficient documentation

## 2015-12-31 DIAGNOSIS — K921 Melena: Secondary | ICD-10-CM | POA: Diagnosis not present

## 2015-12-31 HISTORY — DX: Hemorrhage of anus and rectum: K62.5

## 2015-12-31 LAB — HEMOCCULT GUIAC POC 1CARD (OFFICE): Fecal Occult Blood, POC: POSITIVE — AB

## 2015-12-31 LAB — POCT HEMOGLOBIN: Hemoglobin: 9.8 g/dL — AB (ref 12.2–16.2)

## 2015-12-31 NOTE — Progress Notes (Signed)
   Subjective:    Patient ID: Tiffany [name], female    DOB: 02/05/1960, 55 y.o.   MRN: [id number]   CC: Bloody stool  HPI: 55 y/o F presents for bloody stool  Bloody stool - happened once this AM - mix of dark and bright red blood in the toilet bowl - denies h/o constipation or straining with defecation - denies abd pain - denies rectal pain - she has not yet had a colonoscopy - has not had rectal bleeding before - no dizzines, lightheadedness, chest pain or SOB - no hematemesis  Smoking status reviewed - current smoker Review of Systems  Per HPI, else denies recent illness, fever, chest pain, shortness of breath, abdominal pain, N/V/D,    Objective:  BP 110/60   Pulse 75   Temp 99 F (37.2 C) (Oral)   Wt 252 lb (114.3 kg)   LMP  (Exact Date)   SpO2 97%   BMI 43.26 kg/m  Vitals and nursing note reviewed  General: NAD Cardiac: RR Respiratory: CTAB, normal effort Extremities: no edema or cyanosis. Skin: warm and dry, no rashes noted Neuro: alert and oriented, no focal deficits Rectal: smal non bleeding external hemorrhoid seen,. NO evidence of thrombosed hemorrhopid. Anoscopy showed dark blood in rectum, no evidence of active bright red bleeding, no nternal hemorroid seen  Hgb- 9.8 Hemoccult- positive   Assessment & Plan:    Rectal bleeding Hgb 9.8 on H/H in the office, hemoccult +. Only one episode of bleeding today, no evidence of profuse bleeding, hemodynamically stable - will refer to GI for outpatient colonoscopy/work up - ED and return precautions discussed    Marilyn Nihiser A. Lincoln Brigham MD, [place] [facility] Resident PGY-3 Pager [phone]

## 2015-12-31 NOTE — Patient Instructions (Signed)
Follow up with PCP for rectal bleeding You will be referred to GI If you have dizziness or lightheadedness, call the office If you have questions or concerns, call the office at [phone]

## 2015-12-31 NOTE — Assessment & Plan Note (Signed)
Hgb 9.8 on H/H in the office, hemoccult +. Only one episode of bleeding today, no evidence of profuse bleeding, hemodynamically stable - will refer to GI for outpatient colonoscopy/work up - ED and return precautions discussed

## 2015-12-31 NOTE — Patient Outreach (Signed)
[facility]) Care Management  12/31/2015  ANNLEE GLANDON [birth year] [id number]   Call placed to member to follow up on appointment with pulmonologist.  She state that the appointment went well and that she was advised to continue all current treatment.  Her next appointment is scheduled for April, she will have PFTs done at that time.  She did have one follow up with primary MD after her discharge, advised that per note, she is to follow up before the end of the year.  Advised to call to schedule follow up appointment.  Will follow up next week to confirm this is done.  If no further needs at that time, will close case.  [name], [place], MSN [place] [phone]

## 2016-01-03 ENCOUNTER — Encounter: Payer: Self-pay | Admitting: Gastroenterology

## 2016-01-06 ENCOUNTER — Ambulatory Visit (INDEPENDENT_AMBULATORY_CARE_PROVIDER_SITE_OTHER): Payer: Medicare HMO | Admitting: Clinical

## 2016-01-06 DIAGNOSIS — F250 Schizoaffective disorder, bipolar type: Secondary | ICD-10-CM

## 2016-01-06 DIAGNOSIS — F1290 Cannabis use, unspecified, uncomplicated: Secondary | ICD-10-CM | POA: Diagnosis not present

## 2016-01-06 DIAGNOSIS — F1590 Other stimulant use, unspecified, uncomplicated: Secondary | ICD-10-CM | POA: Diagnosis not present
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
## 2016-01-06 NOTE — Progress Notes (Signed)
   THERAPIST PROGRESS NOTE  Session Time: 8:05 - 9:00   Participation Level: Active  Behavioral Response: CasualAlertDepressed  Type of Therapy: Individual Therapy  Treatment Goals addressed: improve psychiatric symptoms, Improve unhelpful thought patterns,  Mood stabilization, relapse prevention, harm reduction  Interventions: CBT and Motivational Interviewing,   Summary: Tiffany [name] is a 55 y.o. female who presents with schizoaffective disorder, bipolar type  Suicidal/Homicidal: No -without intent/plan  Therapist Response: [name] met with clinician for an individual session. Irini discussed her psychiatric symptoms and her current life events. [name] and clinician worked together to update her assessment. Madalynn shared that she did not attend IOP as recommended she explained that while she has not smoked crack since Nov 8th (after going to inpatient treatment) she continues to smoke marijuana. Clinician asked open ended questions about her recovery process. Caterine shared that she is attending a 12 step program and working with a sponsor. She shared that she did not share with her sponsor that she is still using pot. Clinician asked open ended questions about whether or not she was interested in quitting pot. [name] in the contemplative stage. Client and clinician discussed her motivations for continued recovery. Client and clinician discussed relapse prevention skills (for Crack)  and harm reduction skills (for marijuana).  Plan: Return again in 2-3 weeks.  Diagnosis: Axis I:   Schizoaffective disorder, bipolar type,    Prajna Vanderpool A, LCSW 01/06/2016

## 2016-01-07 ENCOUNTER — Other Ambulatory Visit: Payer: Self-pay | Admitting: *Deleted

## 2016-01-07 NOTE — Patient Outreach (Signed)
[facility]) Care Management  01/07/2016  Tiffany [name] Mar 25, 1960 [id number]   Call placed to member to follow up on current health status, no answer.  HIPAA compliant voice message left.  Will await call back.  If no call back, will follow up next week.  [name], [place], MSN [place] [phone]
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
## 2016-01-13 ENCOUNTER — Ambulatory Visit (HOSPITAL_COMMUNITY): Payer: Self-pay | Admitting: Psychiatry

## 2016-01-13 ENCOUNTER — Other Ambulatory Visit: Payer: Self-pay | Admitting: *Deleted

## 2016-01-13 NOTE — Patient Outreach (Signed)
[place]) Care Management  01/13/2016  Tiffany [name] Sep 29, 1960 [id number]   Call placed to member to follow up on current health status and to possibly close case.  No answer, HIPAA compliant voice message left.  Will await call back.  If no call back will follow up next week.   [name], [place], MSN [place] [phone]

## 2016-01-14 ENCOUNTER — Ambulatory Visit: Payer: Self-pay | Admitting: *Deleted

## 2016-01-18 ENCOUNTER — Other Ambulatory Visit: Payer: Self-pay | Admitting: Family Medicine

## 2016-01-21 ENCOUNTER — Other Ambulatory Visit: Payer: Self-pay | Admitting: *Deleted

## 2016-01-21 ENCOUNTER — Encounter: Payer: Self-pay | Admitting: *Deleted

## 2016-01-21 NOTE — Patient Outreach (Addendum)
[place] [facility]) Care Management  01/21/2016  Tiffany [name] 1960-03-23 [id number]   Call placed to member to follow up on current health status, no answer.  HIPAA compliant voice message left.  Will await call back.  This is the 3rd consecutive unsuccessful attempt to contact member, will send outreach letter.  If no response in 10 days, will close case.   Update @ 1145:  Call received back from member.  She state that she is doing well, denies concerns.  She state that she does not have a follow up appointment scheduled with her primary MD at this time because "everything is good."  [name] reminded of her appointment with the GI specialist on 2/1 and the pulmonologist on 4/23.  She denies needing transportation.  Information for both appointments provided (MD names and phone numbers).  She state that she has a follow up appointment with her counselor next week and has reported staying away from drugs.    Member made aware that this care manager will close case at this time due to [facility] goals being met.  [name] aware that her sobriety should be her main focus at this time, but [facility] is available for assistance should any other health concerns arise.    Will notify care management assistant and primary MD of case closure.  [name], [place], MSN [place] [phone]

## 2016-01-25 DIAGNOSIS — J189 Pneumonia, unspecified organism: Secondary | ICD-10-CM | POA: Diagnosis not present

## 2016-01-25 DIAGNOSIS — J45909 Unspecified asthma, uncomplicated: Secondary | ICD-10-CM | POA: Diagnosis not present

## 2016-01-25 DIAGNOSIS — R062 Wheezing: Secondary | ICD-10-CM | POA: Diagnosis not present

## 2016-01-25 DIAGNOSIS — G4733 Obstructive sleep apnea (adult) (pediatric): Secondary | ICD-10-CM | POA: Diagnosis not present

## 2016-01-25 DIAGNOSIS — R0602 Shortness of breath: Secondary | ICD-10-CM | POA: Diagnosis not present

## 2016-01-25 DIAGNOSIS — J440 Chronic obstructive pulmonary disease with acute lower respiratory infection: Secondary | ICD-10-CM | POA: Diagnosis not present

## 2016-01-26 ENCOUNTER — Other Ambulatory Visit: Payer: Self-pay | Admitting: Family Medicine

## 2016-01-27 ENCOUNTER — Ambulatory Visit (HOSPITAL_COMMUNITY): Payer: Self-pay | Admitting: Clinical

## 2016-01-27 DIAGNOSIS — F319 Bipolar disorder, unspecified: Secondary | ICD-10-CM | POA: Diagnosis not present

## 2016-01-27 DIAGNOSIS — J449 Chronic obstructive pulmonary disease, unspecified: Secondary | ICD-10-CM | POA: Diagnosis not present

## 2016-01-27 DIAGNOSIS — I130 Hypertensive heart and chronic kidney disease with heart failure and stage 1 through stage 4 chronic kidney disease, or unspecified chronic kidney disease: Secondary | ICD-10-CM | POA: Diagnosis not present

## 2016-01-27 DIAGNOSIS — N183 Chronic kidney disease, stage 3 (moderate): Secondary | ICD-10-CM | POA: Diagnosis not present

## 2016-01-27 DIAGNOSIS — R55 Syncope and collapse: Secondary | ICD-10-CM | POA: Diagnosis not present

## 2016-01-27 DIAGNOSIS — I5032 Chronic diastolic (congestive) heart failure: Secondary | ICD-10-CM | POA: Diagnosis not present

## 2016-01-27 DIAGNOSIS — E1122 Type 2 diabetes mellitus with diabetic chronic kidney disease: Secondary | ICD-10-CM | POA: Diagnosis not present

## 2016-01-27 DIAGNOSIS — F209 Schizophrenia, unspecified: Secondary | ICD-10-CM | POA: Diagnosis not present

## 2016-01-27 DIAGNOSIS — D631 Anemia in chronic kidney disease: Secondary | ICD-10-CM | POA: Diagnosis not present

## 2016-02-01 ENCOUNTER — Ambulatory Visit (HOSPITAL_COMMUNITY): Payer: Self-pay | Admitting: Clinical

## 2016-02-01 ENCOUNTER — Other Ambulatory Visit: Payer: Self-pay | Admitting: Family Medicine

## 2016-02-02 ENCOUNTER — Other Ambulatory Visit: Payer: Self-pay | Admitting: *Deleted

## 2016-02-02 DIAGNOSIS — N183 Chronic kidney disease, stage 3 (moderate): Secondary | ICD-10-CM | POA: Diagnosis not present

## 2016-02-02 DIAGNOSIS — R55 Syncope and collapse: Secondary | ICD-10-CM | POA: Diagnosis not present

## 2016-02-02 DIAGNOSIS — I130 Hypertensive heart and chronic kidney disease with heart failure and stage 1 through stage 4 chronic kidney disease, or unspecified chronic kidney disease: Secondary | ICD-10-CM | POA: Diagnosis not present

## 2016-02-02 DIAGNOSIS — J449 Chronic obstructive pulmonary disease, unspecified: Secondary | ICD-10-CM | POA: Diagnosis not present

## 2016-02-02 DIAGNOSIS — E1122 Type 2 diabetes mellitus with diabetic chronic kidney disease: Secondary | ICD-10-CM | POA: Diagnosis not present

## 2016-02-02 DIAGNOSIS — F209 Schizophrenia, unspecified: Secondary | ICD-10-CM | POA: Diagnosis not present

## 2016-02-02 DIAGNOSIS — D631 Anemia in chronic kidney disease: Secondary | ICD-10-CM | POA: Diagnosis not present

## 2016-02-02 DIAGNOSIS — F319 Bipolar disorder, unspecified: Secondary | ICD-10-CM | POA: Diagnosis not present

## 2016-02-02 DIAGNOSIS — I5032 Chronic diastolic (congestive) heart failure: Secondary | ICD-10-CM | POA: Diagnosis not present

## 2016-02-02 NOTE — Patient Outreach (Signed)
[place] [facility]) Care Management  02/02/2016  Tiffany [name] May 20, 1960 [id number]   Voice message received from patient requesting a call back.  Call placed to member, she state that her legs have been swollen for the past couple days.  She state that she contacted her primary MD office but was not able to get through.  This care manager inquired about member's management of her heart failure.  She state that she has been taking her furosemide as prescribed, but has not been consistently following low sodium diet, nor has she been elevating her legs appropriately.  This care manager inquired about contact with cardiologist, however member does not currently have cardiologist due to failure to follow up and attend appointments.    Re-educated on proper heart failure management, diet, exercise, fluid restriction, elevating feet, and medication adherence.  [name] advised to contact her primary MD again to schedule appointment and to inquire about referral to cardiologist.  She verbalizes understanding.  Will not reopen case at this time.  [name], [place], MSN [place] [phone]

## 2016-02-04 ENCOUNTER — Other Ambulatory Visit: Payer: Self-pay | Admitting: *Deleted

## 2016-02-07 ENCOUNTER — Other Ambulatory Visit: Payer: Self-pay | Admitting: Family Medicine

## 2016-02-07 ENCOUNTER — Other Ambulatory Visit (HOSPITAL_COMMUNITY): Payer: Self-pay | Admitting: Psychiatry

## 2016-02-07 DIAGNOSIS — F200 Paranoid schizophrenia: Secondary | ICD-10-CM

## 2016-02-07 MED ORDER — LISINOPRIL 40 MG PO TABS: 40.0000 mg | ORAL_TABLET | Freq: Every day | ORAL | 1 refills | Status: DC

## 2016-02-08 ENCOUNTER — Other Ambulatory Visit (HOSPITAL_COMMUNITY): Payer: Self-pay | Admitting: Psychiatry

## 2016-02-14 ENCOUNTER — Other Ambulatory Visit: Payer: Self-pay | Admitting: Family Medicine

## 2016-02-17 ENCOUNTER — Ambulatory Visit: Payer: Self-pay | Admitting: Gastroenterology

## 2016-02-17 ENCOUNTER — Other Ambulatory Visit: Payer: Self-pay

## 2016-02-18 ENCOUNTER — Ambulatory Visit (INDEPENDENT_AMBULATORY_CARE_PROVIDER_SITE_OTHER): Payer: Medicare HMO | Admitting: Family Medicine

## 2016-02-18 ENCOUNTER — Encounter: Payer: Self-pay | Admitting: Family Medicine

## 2016-02-18 VITALS — BP 108/68 | HR 76 | Temp 98.0°F | Ht 64.0 in | Wt 275.8 lb

## 2016-02-18 DIAGNOSIS — I5033 Acute on chronic diastolic (congestive) heart failure: Secondary | ICD-10-CM

## 2016-02-18 DIAGNOSIS — J441 Chronic obstructive pulmonary disease with (acute) exacerbation: Secondary | ICD-10-CM

## 2016-02-18 LAB — BASIC METABOLIC PANEL WITH GFR
BUN: 22 mg/dL (ref 7–25)
CO2: 32 mmol/L — ABNORMAL HIGH (ref 20–31)
Calcium: 9.2 mg/dL (ref 8.6–10.4)
Chloride: 101 mmol/L (ref 98–110)
Creat: 1.22 mg/dL — ABNORMAL HIGH (ref 0.50–1.05)
GFR, Est African American: 57 mL/min — ABNORMAL LOW (ref >=60)
GFR, Est Non African American: 50 mL/min — ABNORMAL LOW (ref >=60)
Glucose, Bld: 89 mg/dL (ref 65–99)
Potassium: 4.5 mmol/L (ref 3.5–5.3)
Sodium: 138 mmol/L (ref 135–146)

## 2016-02-18 MED ORDER — DOXYCYCLINE HYCLATE 100 MG PO TABS: 100.0000 mg | ORAL_TABLET | Freq: Two times a day (BID) | ORAL | 0 refills | Status: DC

## 2016-02-18 MED ORDER — ALBUTEROL SULFATE (2.5 MG/3ML) 0.083% IN NEBU: 2.5000 mg | INHALATION_SOLUTION | Freq: Four times a day (QID) | RESPIRATORY_TRACT | 1 refills | Status: DC | PRN

## 2016-02-18 MED ORDER — PREDNISONE 50 MG PO TABS: 50.0000 mg | ORAL_TABLET | Freq: Every day | ORAL | 0 refills | Status: DC

## 2016-02-18 NOTE — Progress Notes (Signed)
Subjective:     Patient ID: Tiffany [name], female   DOB: 1960/09/24, 56 y.o.   MRN: [id number]  HPI Tiffany [name] is a 56yo female presenting today for productive cough, shortness of breath, and lower extremity edema. Reports history of COPD and HFpEF, both of which she believes are currently flared. Notes productive cough for the last several days, wheezing, and shortness of breath. Has been using her albuterol inhaler 3-4 times per day with some improvement. Requests refill of albuterol nebulizer solution. Also using Breo, Spiriva, and Singulair for COPD. Denies fever. Notes worsening lower extremity edema and is up 6 pounds in two days. Shortness of breath as noted above. Denies chest pain or palpitations. Currently takes Furosemide 40mg  daily and reports good urine output with this dose. Last Echocardiogram in 08/2015 with EF 93-71%, grade 1 diastolic dysfunction. Has appointment with Pulmonology on 05/08/16. Does not currently have a cardiologist--previously followed by Cardiology (Tiffany [name]) but was discharged after multiple no shows. Requests referral to another Cardiologist today. Smoker.  Review of Systems Per HPI    Objective:   Physical Exam  Constitutional: She appears well-developed and well-nourished. No distress.  HENT:  Head: Normocephalic and atraumatic.  Mouth/Throat: Oropharynx is clear and moist.  Left and right tympanic membrane normal, middle ear appears normal  Cardiovascular: Normal rate and regular rhythm.   No murmur heard. Pulmonary/Chest: Effort normal. No respiratory distress. She has no wheezes.  No crackles. Speaking in complete sentences. Cough noted.  Musculoskeletal:  3+ pitting edema to knee  Psychiatric: She has a normal mood and affect. Her behavior is normal.      Assessment and Plan:     1. Acute on chronic diastolic congestive heart failure ([place]) Weight 275pounds today, up from 250pounds in December 2017. Patient reports weight increased 6  pounds over 2 days. Good urine output with Lasix 40mg  daily--will increase to twice daily dosing. Will check BMP today. To monitor weight closely and if she notices weight is continuing to increase and/or edema worsening, to return to office on Monday. If shortness of breath worsens over the weekend, to go to ED--may need IV diuresis if no improvement. O2 Saturation 92% on room air. Appears to not be on beta blocker due to significant pulmonary disease. Referral to Cardiology placed.  2. COPD exacerbation (H[facility]) No wheezing on exam today, but patient reports productive cough and wheezing at home requiring albuterol multiple times today. Will initiate treatment with Doxycycline and Prednisone. Albuterol nebulizer refilled.

## 2016-02-18 NOTE — Patient Instructions (Addendum)
Thank you so much for coming to visit today! I have refilled your Albuterol nebulizer and sent prescriptions for Doxycyline and Prednisone to your pharmacy.  Please take two tablets of your Lasix twice a day for the next four days; you may then decrease back down to two tablets daily. Follow up on Monday if you do not have any improvement. Please go to the ED if your shortness of breath worsens over the weekend.  Dr. Gerlean Ren

## 2016-02-21 ENCOUNTER — Ambulatory Visit (INDEPENDENT_AMBULATORY_CARE_PROVIDER_SITE_OTHER): Payer: Medicare HMO | Admitting: Clinical

## 2016-02-21 DIAGNOSIS — F1590 Other stimulant use, unspecified, uncomplicated: Secondary | ICD-10-CM

## 2016-02-21 DIAGNOSIS — F250 Schizoaffective disorder, bipolar type: Secondary | ICD-10-CM | POA: Diagnosis not present

## 2016-02-21 DIAGNOSIS — F1290 Cannabis use, unspecified, uncomplicated: Secondary | ICD-10-CM | POA: Diagnosis not present

## 2016-02-21 NOTE — Progress Notes (Signed)
   THERAPIST PROGRESS NOTE  Session Time: 11:03 - 11:58  Participation Level: Active  Behavioral Response: CasualAlertAnxious  Type of Therapy: Individual Therapy  Treatment Goals addressed: improve psychiatric symptoms, Improve unhelpful thought patterns,   interpersonal relationship skills, relapse prevention  Interventions: CBT and Motivational Interviewing,   Summary: Tiffany [name] is a 56 y.o. female who presents with schizoaffective disorder, bipolar type and stimulate use disorder and marijuana use  Suicidal/Homicidal: No -without intent/plan  Therapist Response: [name] met with clinician for an individual session. [name] discussed her psychiatric symptoms, her current life events and her homework. She shared that she has continued to remain free from crack cocaine but continues to use marijuana. She shared that she did not go into IOP because of her continued use of marijuana. She shared that she has been struggling with negative thoughts about herself because her to feel depressed and anxious. Clinician asked open ended questions. [name] shared her negative automatic thoughts and that they keep her from leaving the house or having positive social interactions. Clinician asked open ended questions. [name] identify her negative thoughts and emotions (she rated her emotions). She denied that by the evidence for and against the negative automatic thoughts. She was then able to formulate healthier alternative thoughts. She then rated her negative emotions as much lower. Client and clinician discussed how her actions might change if she believed the healthier alternative thoughts. Clinician asked open ended questions a bout where [name] could have healthy interactions with others. [name] shared he did go to her 12-step meeting and she also identified other healthy opportunities for social interaction. Client and clinician discussed relapse prevention skills and health interpersonal relationship  skills  Plan: Return again in 2-3 weeks.  Diagnosis: Axis I:   schizoaffective disorder, bipolar type and stimulate use disorder and marijuana use    Matthew Cina A, LCSW 02/21/2016

## 2016-02-22 ENCOUNTER — Ambulatory Visit (INDEPENDENT_AMBULATORY_CARE_PROVIDER_SITE_OTHER): Payer: Medicare HMO | Admitting: Psychiatry

## 2016-02-22 ENCOUNTER — Encounter (HOSPITAL_COMMUNITY): Payer: Self-pay | Admitting: Psychiatry

## 2016-02-22 VITALS — BP 132/78 | HR 78 | Ht 64.0 in | Wt 268.0 lb

## 2016-02-22 DIAGNOSIS — Z882 Allergy status to sulfonamides status: Secondary | ICD-10-CM

## 2016-02-22 DIAGNOSIS — Z9889 Other specified postprocedural states: Secondary | ICD-10-CM

## 2016-02-22 DIAGNOSIS — Z90710 Acquired absence of both cervix and uterus: Secondary | ICD-10-CM

## 2016-02-22 DIAGNOSIS — F200 Paranoid schizophrenia: Secondary | ICD-10-CM

## 2016-02-22 DIAGNOSIS — Z801 Family history of malignant neoplasm of trachea, bronchus and lung: Secondary | ICD-10-CM

## 2016-02-22 DIAGNOSIS — Z8249 Family history of ischemic heart disease and other diseases of the circulatory system: Secondary | ICD-10-CM

## 2016-02-22 DIAGNOSIS — F17210 Nicotine dependence, cigarettes, uncomplicated: Secondary | ICD-10-CM

## 2016-02-22 DIAGNOSIS — Z79899 Other long term (current) drug therapy: Secondary | ICD-10-CM

## 2016-02-22 DIAGNOSIS — Z888 Allergy status to other drugs, medicaments and biological substances status: Secondary | ICD-10-CM

## 2016-02-22 DIAGNOSIS — Z8042 Family history of malignant neoplasm of prostate: Secondary | ICD-10-CM

## 2016-02-22 MED ORDER — MIRTAZAPINE 30 MG PO TABS: 30.0000 mg | ORAL_TABLET | Freq: Every day | ORAL | 2 refills | Status: DC

## 2016-02-22 MED ORDER — BENZTROPINE MESYLATE 1 MG PO TABS: 1.0000 mg | ORAL_TABLET | Freq: Every day | ORAL | 2 refills | Status: DC

## 2016-02-22 MED ORDER — ESCITALOPRAM OXALATE 20 MG PO TABS: 20.0000 mg | ORAL_TABLET | Freq: Every day | ORAL | 2 refills | Status: DC

## 2016-02-22 MED ORDER — ZIPRASIDONE HCL 80 MG PO CAPS: 80.0000 mg | ORAL_CAPSULE | Freq: Every day | ORAL | 2 refills | Status: DC

## 2016-02-22 MED ORDER — DIVALPROEX SODIUM ER 250 MG PO TB24: 250.0000 mg | ORAL_TABLET | Freq: Every day | ORAL | 2 refills | Status: DC

## 2016-02-22 NOTE — Progress Notes (Signed)
BH MD/PA/NP OP Progress Note  02/22/2016 2:34 PM Tiffany [name]  MRN:  [id number]  Chief Complaint:  Subjective:  I get some time irritable and angry.  I guess I have a short fuse.  HPI: Patient came for her follow-up appointment.  [name] compliant with her medication but continued to endorse at times irritability and anger.  Her major issue is living with her multiple family member.  [name] seeing Frankie for coping skills.  She sleeping okay.  She denies any paranoia or any hallucination but endorsed getting frustrated easily.  [name] concerned about her health issues.  Recently she's seen her primary care physician and she has blood work.  She was also seen pulmonologist for COPD and recommended oxygen but she does not want to take oxygen all the time.  [name] taking multiple medication.  Though she denies any paranoia or any hallucination but wondering if she can take [name] to help her mood.  She has taken [name] in the past.  She has no rash, itching, tremors or shakes.  In the past she used to take higher dose of Geodon however dose was reduced to prolonged QTC.  She claims to be sober from using drugs.  She believe talking to Frank is helping her.  Visit Diagnosis:    ICD-9-CM ICD-10-CM   1. Paranoid schizophrenia (HCC) 295.30 F20.0 ziprasidone (GEODON) 80 MG capsule     mirtazapine (REMERON) 30 MG tablet     escitalopram (LEXAPRO) 20 MG tablet     benztropine (COGENTIN) 1 MG tablet     divalproex ([name] ER) 250 MG 24 hr tablet    Past Psychiatric History: Reviewed. Patient has multiple psychiatric hospitalization due to relapse into drug use and mental illness.  Her last psychiatric hospitalization was in November 2017 at [facility].  She remember having psychiatric illness started in 1986 when her daughter died due to cancer.  She had tried Abilify, Risperdal , Wellbutrin , Xanax , Klonopin, trazodone, BuSpar in the past.  She was seeing psychiatrist at [facility] .   Patient denies any history of suicidal attempt but endorsed history of suicidal thoughts.  She endorse history of paranoia, delusion, anxiety and manic-like symptoms.   Past Medical History:  Past Medical History:  Diagnosis Date  . Anxiety   . Asthma   . Cocaine abuse   . Complication of anesthesia    decreased bp, decreased heart rate  . Diabetes mellitus    class 2  . Disorder of nervous system   . Emphysema   . Heart attack   . High blood pressure   . High cholesterol   . Hypertension   . Incontinence   . Manic depression (HCC)   . Myocardial infarct   . Pneumonia 06/2015  . Seasonal allergies   . Seizures (HCC)   . Sinus trouble   . Sleep apnea   . Stroke (HCC)     Past Surgical History:  Procedure Laterality Date  . ABDOMINAL HYSTERECTOMY    . CESAREAN SECTION    . HIATAL HERNIA REPAIR      Family Psychiatric History: Reviewed.  Family History:  Family History  Problem Relation Age of Onset  . Cancer Father     prostate  . Cancer Mother     lung  . Depression Mother   . Depression Sister   . Anxiety disorder Sister   . Schizophrenia Sister   . Bipolar disorder Sister   . Depression Sister   .   Depression Brother   . Heart failure      cousin    Social History:  Social History   Social History  . Marital status: Widowed    Spouse name: N/A  . Number of children: 3  . Years of education: N/A   Occupational History  . disabled     factory production   Social History Main Topics  . Smoking status: Current Every Day Smoker    Packs/day: 0.50    Years: 37.00    Types: Cigarettes    Start date: 01/16/1977  . Smokeless tobacco: Never Used     Comment: Previous 2 PPD menthol full flavor  . Alcohol use No  . Drug use: No  . Sexual activity: Not Currently    Birth control/ protection: Surgical   Other Topics Concern  . None   Social History Narrative  . None    Allergies:  Allergies  Allergen Reactions  . Hydroxyzine Other (See  Comments)    unknown  . Codeine Nausea And Vomiting  . Sulfa Antibiotics Itching    Metabolic Disorder Labs: Lab Results  Component Value Date   HGBA1C 5.6 11/25/2015   MPG 114 11/25/2015   MPG 123 (H) 04/24/2011   No results found for: PROLACTIN Lab Results  Component Value Date   CHOL 135 12/04/2015   TRIG 90 12/04/2015   HDL 55 12/04/2015   CHOLHDL 2.5 12/04/2015   VLDL 18 12/04/2015   LDLCALC 62 12/04/2015   LDLCALC 54 07/15/2013     Current Medications: Current Outpatient Prescriptions  Medication Sig Dispense Refill  . ACCU-CHEK SOFTCLIX LANCETS lancets TEST FOUR TIMES DAILY 400 each 12  . albuterol (PROVENTIL HFA;VENTOLIN HFA) 108 (90 Base) MCG/ACT inhaler Inhale 2 puffs into the lungs every 6 (six) hours as needed. 1 Inhaler 6  . albuterol (PROVENTIL) (2.5 MG/3ML) 0.083% nebulizer solution Take 3 mLs (2.5 mg total) by nebulization every 6 (six) hours as needed for wheezing or shortness of breath. 150 mL 1  . Alcohol Swabs (B-D SINGLE USE SWABS REGULAR) PADS CHECK CAPILLARY BLOOD GLUCOSE ONE TIME DAILY 100 each 3  . amantadine (SYMMETREL) 100 MG capsule     . amLODipine (NORVASC) 5 MG tablet TAKE 2 TABLETS EVERY DAY 180 tablet 0  . aspirin 81 MG EC tablet Take 1 tablet (81 mg total) by mouth daily. Swallow whole. 30 tablet 1  . atorvastatin (LIPITOR) 40 MG tablet Take 1 tablet (40 mg total) by mouth daily. 30 tablet 1  . benztropine (COGENTIN) 1 MG tablet Take 1 tablet (1 mg total) by mouth daily. 30 tablet 1  . Blood Glucose Monitoring Suppl (ACCU-CHEK AVIVA PLUS) w/Device KIT CHECK BLOOD SUGAR THREE TIMES DAILY 1 kit 0  . BREO ELLIPTA 100-25 MCG/INH AEPB INHALE 1 PUFF EVERY DAY 180 each 0  . doxycycline (VIBRA-TABS) 100 MG tablet Take 1 tablet (100 mg total) by mouth 2 (two) times daily. 20 tablet 0  . escitalopram (LEXAPRO) 20 MG tablet Take 1 tablet (20 mg total) by mouth daily. 30 tablet 1  . furosemide (LASIX) 40 MG tablet Take 1 tablet (40 mg total) by mouth  daily. Take Lasix twice daily from 7/14 until 7/18, then daily (Patient taking differently: Take 80 mg by mouth daily. Take Lasix twice daily from 7/14 until 7/18, then daily) 30 tablet 0  . gabapentin (NEURONTIN) 600 MG tablet Take 1 tablet (600 mg total) by mouth 3 (three) times daily. 90 tablet 1  . glucose blood test   strip Use to check blood sugar 3 times daily. 100 each 12  . KLOR-CON M20 20 MEQ tablet     . lisinopril (PRINIVIL,ZESTRIL) 40 MG tablet Take 1 tablet (40 mg total) by mouth daily. 30 tablet 1  . metFORMIN (GLUCOPHAGE-XR) 500 MG 24 hr tablet Take 1 tablet (500 mg total) by mouth 2 (two) times daily. 60 tablet 1  . mirtazapine (REMERON) 30 MG tablet Take 1 tablet (30 mg total) by mouth at bedtime. 30 tablet 1  . montelukast (SINGULAIR) 10 MG tablet Take 1 tablet (10 mg total) by mouth at bedtime. 30 tablet 1  . Multiple Vitamins-Minerals (MULTIVITAMIN WITH MINERALS) tablet Take 1 tablet by mouth daily. Reported on 07/28/2015 30 tablet 1  . naproxen (NAPROSYN) 500 MG tablet Take 1 tablet (500 mg total) by mouth 3 (three) times daily as needed for mild pain. 90 tablet 1  . omeprazole (PRILOSEC) 40 MG capsule Take 1 capsule (40 mg total) by mouth daily. 30 capsule 1  . oxybutynin (DITROPAN) 5 MG tablet Take 1 tablet (5 mg total) by mouth 3 (three) times daily. 90 tablet 1  . predniSONE (DELTASONE) 50 MG tablet Take 1 tablet (50 mg total) by mouth daily with breakfast. 5 tablet 0  . tiotropium (SPIRIVA HANDIHALER) 18 MCG inhalation capsule Place 1 capsule (18 mcg total) into inhaler and inhale daily. 90 capsule 1  . verapamil (VERELAN PM) 240 MG 24 hr capsule TAKE 2 CAPSULES AT BEDTIME 180 capsule 0  . ziprasidone (GEODON) 80 MG capsule Take 1 capsule (80 mg total) by mouth at bedtime. 30 capsule 1   No current facility-administered medications for this visit.     Neurologic: Headache: No Seizure: No Paresthesias: No  Musculoskeletal: Strength & Muscle Tone: within normal  limits Gait & Station: normal Patient leans: N/A  Psychiatric Specialty Exam: Review of Systems  Constitutional: Positive for malaise/fatigue.  HENT: Negative.   Respiratory:       Gets shortness of breath  Musculoskeletal: Positive for back pain.  Skin: Negative for itching and rash.  Neurological: Positive for tingling.       Neuropathy pain    Blood pressure 132/78, pulse 78, height 5' 4" (1.626 m), weight 268 lb (121.6 kg).Body mass index is 46 kg/m.  General Appearance: Casual  Eye Contact:  Good  Speech:  Clear and Coherent  Volume:  Normal  Mood:  Irritable  Affect:  Appropriate  Thought Process:  Goal Directed  Orientation:  Full (Time, Place, and Person)  Thought Content: Logical   Suicidal Thoughts:  No  Homicidal Thoughts:  No  Memory:  Immediate;   Good Recent;   Good Remote;   Fair  Judgement:  Good  Insight:  Good  Psychomotor Activity:  Normal  Concentration:  Concentration: Good and Attention Span: Good  Recall:  Good  Fund of Knowledge: Good  Language: Good  Akathisia:  No  Handed:  Right  AIMS (if indicated):  0  Assets:  Communication Skills Desire for Improvement Housing  ADL's:  Intact  Cognition: WNL  Sleep:  fair   Assessment: Schizophrenia chronic paranoid type.  Anxiety disorder NOS.  Cocaine abuse in partial.  remission  Plan: Patient continues to h ave mood lability and irritability.  She wants to go back on [name] which she has taken with good response.  She does not want to change her current psychiatric medication.  I will continue Lexapro 20 mg daily, Remeron 30 mg at bedtime, Geodon 80 mg at  bedtime.  I will add [name] 250 mg at bedtime.  Discuss in length medication side effects but the weight gain.  She had blood work on February 2 and her creatinine was 1.22 and glucose 89.  Encourage to watch her calorie intake and to regular exercise.  Encouraged to continue counseling with Frankie.  Recommended to call us back if she has  any question, concern if she feels worsening of the symptom.  Follow-up in 2 months. Discussed medication side effects and benefits.  Recommended to call us back if there is any question, concern or worsening of the symptoms.  Discuss safety plan that anytime having active suicidal thoughts or homicidal thoughts and she need to call 911 or go to the local emergency room.  , T., MD 02/22/2016, 2:34 PM 

## 2016-02-23 ENCOUNTER — Other Ambulatory Visit (HOSPITAL_COMMUNITY): Payer: Self-pay

## 2016-02-23 ENCOUNTER — Encounter (HOSPITAL_COMMUNITY): Payer: Self-pay | Admitting: Clinical

## 2016-02-23 ENCOUNTER — Other Ambulatory Visit (HOSPITAL_COMMUNITY): Payer: Self-pay | Admitting: Psychiatry

## 2016-02-23 DIAGNOSIS — F200 Paranoid schizophrenia: Secondary | ICD-10-CM

## 2016-02-23 MED ORDER — DIVALPROEX SODIUM ER 250 MG PO TB24: 250.0000 mg | ORAL_TABLET | Freq: Every day | ORAL | 0 refills | Status: DC

## 2016-02-25 DIAGNOSIS — J45909 Unspecified asthma, uncomplicated: Secondary | ICD-10-CM | POA: Diagnosis not present

## 2016-02-25 DIAGNOSIS — J440 Chronic obstructive pulmonary disease with acute lower respiratory infection: Secondary | ICD-10-CM | POA: Diagnosis not present

## 2016-02-25 DIAGNOSIS — G4733 Obstructive sleep apnea (adult) (pediatric): Secondary | ICD-10-CM | POA: Diagnosis not present

## 2016-02-25 DIAGNOSIS — R0602 Shortness of breath: Secondary | ICD-10-CM | POA: Diagnosis not present

## 2016-02-25 DIAGNOSIS — R062 Wheezing: Secondary | ICD-10-CM | POA: Diagnosis not present

## 2016-02-25 DIAGNOSIS — J189 Pneumonia, unspecified organism: Secondary | ICD-10-CM | POA: Diagnosis not present

## 2016-02-29 ENCOUNTER — Other Ambulatory Visit (HOSPITAL_COMMUNITY): Payer: Self-pay | Admitting: Psychiatry

## 2016-02-29 ENCOUNTER — Other Ambulatory Visit: Payer: Self-pay | Admitting: Family Medicine

## 2016-02-29 DIAGNOSIS — F200 Paranoid schizophrenia: Secondary | ICD-10-CM

## 2016-03-01 ENCOUNTER — Other Ambulatory Visit (HOSPITAL_COMMUNITY): Payer: Self-pay | Admitting: Psychiatry

## 2016-03-01 ENCOUNTER — Other Ambulatory Visit: Payer: Self-pay | Admitting: Family Medicine

## 2016-03-01 DIAGNOSIS — F200 Paranoid schizophrenia: Secondary | ICD-10-CM

## 2016-03-03 ENCOUNTER — Other Ambulatory Visit (HOSPITAL_COMMUNITY): Payer: Self-pay

## 2016-03-03 DIAGNOSIS — F200 Paranoid schizophrenia: Secondary | ICD-10-CM

## 2016-03-03 MED ORDER — BENZTROPINE MESYLATE 1 MG PO TABS: 1.0000 mg | ORAL_TABLET | Freq: Every day | ORAL | 0 refills | Status: DC

## 2016-03-06 ENCOUNTER — Other Ambulatory Visit: Payer: Self-pay | Admitting: Family Medicine

## 2016-03-09 ENCOUNTER — Ambulatory Visit (HOSPITAL_COMMUNITY): Payer: Self-pay | Admitting: Clinical

## 2016-03-13 ENCOUNTER — Ambulatory Visit (HOSPITAL_COMMUNITY): Payer: Self-pay | Admitting: Clinical

## 2016-03-15 ENCOUNTER — Encounter (INDEPENDENT_AMBULATORY_CARE_PROVIDER_SITE_OTHER): Payer: Self-pay

## 2016-03-15 ENCOUNTER — Encounter: Payer: Self-pay | Admitting: Cardiology

## 2016-03-15 ENCOUNTER — Ambulatory Visit (INDEPENDENT_AMBULATORY_CARE_PROVIDER_SITE_OTHER): Payer: Medicare HMO | Admitting: Cardiology

## 2016-03-15 VITALS — BP 100/64 | HR 76 | Ht 64.0 in | Wt 279.0 lb

## 2016-03-15 DIAGNOSIS — R0602 Shortness of breath: Secondary | ICD-10-CM | POA: Diagnosis not present

## 2016-03-15 DIAGNOSIS — I10 Essential (primary) hypertension: Secondary | ICD-10-CM

## 2016-03-15 DIAGNOSIS — I5032 Chronic diastolic (congestive) heart failure: Secondary | ICD-10-CM | POA: Diagnosis not present

## 2016-03-15 NOTE — Patient Instructions (Signed)
Medication Instructions:  Your physician recommends that you continue on your current medications as directed. Please refer to the Current Medication list given to you today.   Labwork: TODAY: BMET, CBC, TSH, BNP  Testing/Procedures: Your physician has requested that you have an echocardiogram. Echocardiography is a painless test that uses sound waves to create images of your heart. It provides your doctor with information about the size and shape of your heart and how well your heart's chambers and valves are working. This procedure takes approximately one hour. There are no restrictions for this procedure.  Follow-Up: Your physician wants you to follow-up in: 6 months with Dr. Marlou Porch. You will receive a reminder letter in the mail two months in advance. If you don't receive a letter, please call our office to schedule the follow-up appointment.   Any Other Special Instructions Will Be Listed Below (If Applicable).     If you need a refill on your cardiac medications before your next appointment, please call your pharmacy.

## 2016-03-15 NOTE — Progress Notes (Signed)
Cardiology Office Note    Date:  03/22/2016   ID:  Tiffany [name], DOB 06-Oct-1960, MRN [id number]  PCP:  Kathrine Cords, MD  Cardiologist:  Candee Furbish, MD   Chief Complaint  Patient presents with  . Congestive Heart Failure  . Hypertension    History of Present Illness:  Tiffany [name] is a 56 y.o. female with a history of asthma/COPD, anxiety, cocaine abuse, type 2 DM, HTN, hyperlipidemia, HFpEF and prior MI presents today for followup.  Last 2D echo showed normal LVF with EF 55-60% and grade 1 diastolic dysfunction.  [name] followed by Dr. Marlou Porch several years ago and lost to followup and then was seen again during hospitalization last summer with acute respiratory failure and one elevated troponin felt secondary to demand ischemia.  She also had acute diastolic CHF at that time.  She has O2 dependent COPD and history of cath with no prior stenting.  She has a history of polysubstance abuse with cocaine and tobacco.    She was recently seen by her PCP with increased SOB, LE edema and productive cough which she thinks are related to COPD as well as HFpEF.  She has had wheezing as well and her symptoms improved with albuterol inhaler.  She says that she has gained weight recently with increased LE edema.  She has not had any chest pain or pressure.  She denies any palpitations or syncope.  Apparently her weight is usually around 250lbs back in 12/2015 but recently as high as 275lbs.  Her lasix was increased by her PCP with improvement in her SOB but weight is up 4 more pounds than earlier in the month.      Past Medical History:  Diagnosis Date  . Anxiety   . Asthma   . Cocaine abuse   . Complication of anesthesia    decreased bp, decreased heart rate  . Diabetes mellitus    class 2  . Disorder of nervous system   . Emphysema   . Heart attack   . Hyperlipidemia LDL goal <70   . Hypertension   . Incontinence   . Manic depression ([place])   . Pneumonia 06/2015  . Seasonal  allergies   . Seizures ([place])   . Sinus trouble   . Sleep apnea   . Stroke [facility])     Past Surgical History:  Procedure Laterality Date  . ABDOMINAL HYSTERECTOMY    . CESAREAN SECTION    . HIATAL HERNIA REPAIR      Current Medications: Current Meds  Medication Sig  . albuterol (PROVENTIL HFA;VENTOLIN HFA) 108 (90 Base) MCG/ACT inhaler Inhale 2 puffs into the lungs every 6 (six) hours as needed.  [name] albuterol (PROVENTIL) (2.5 MG/3ML) 0.083% nebulizer solution Take 3 mLs (2.5 mg total) by nebulization every 6 (six) hours as needed for wheezing or shortness of breath.  [name] amLODipine (NORVASC) 5 MG tablet Take 10 mg by mouth daily.  [name] aspirin 81 MG EC tablet Take 1 tablet (81 mg total) by mouth daily. Swallow whole.  [name] atorvastatin (LIPITOR) 40 MG tablet Take 1 tablet (40 mg total) by mouth daily.  . benztropine (COGENTIN) 1 MG tablet Take 1 tablet (1 mg total) by mouth daily.  [name] BREO ELLIPTA 100-25 MCG/INH AEPB INHALE 1 PUFF EVERY DAY  . divalproex ([name] ER) 250 MG 24 hr tablet Take 1 tablet (250 mg total) by mouth daily.  [name] doxycycline (VIBRA-TABS) 100 MG tablet Take 1 tablet (100 mg total) by  mouth 2 (two) times daily.  [name] escitalopram (LEXAPRO) 20 MG tablet Take 20 mg by mouth daily.  . furosemide (LASIX) 40 MG tablet Take 80 mg by mouth 2 (two) times daily.  [name] gabapentin (NEURONTIN) 600 MG tablet Take 1 tablet (600 mg total) by mouth 3 (three) times daily.  [name] lisinopril (PRINIVIL,ZESTRIL) 40 MG tablet Take 1 tablet (40 mg total) by mouth daily.  . metFORMIN (GLUCOPHAGE-XR) 500 MG 24 hr tablet Take 500 mg by mouth 2 (two) times daily.  . mirtazapine (REMERON) 30 MG tablet TAKE 1 TABLET AT BEDTIME  . mirtazapine (REMERON) 30 MG tablet Take 30 mg by mouth at bedtime.  . montelukast (SINGULAIR) 10 MG tablet Take 1 tablet (10 mg total) by mouth at bedtime.  . Multiple Vitamins-Minerals (MULTIVITAMIN WITH MINERALS) tablet Take 1 tablet by mouth daily. Reported on 07/28/2015  . naproxen  (NAPROSYN) 500 MG tablet Take 1 tablet (500 mg total) by mouth 3 (three) times daily as needed for mild pain.  [name] omeprazole (PRILOSEC) 40 MG capsule Take 1 capsule (40 mg total) by mouth daily.  [name] oxybutynin (DITROPAN) 5 MG tablet Take 1 tablet (5 mg total) by mouth 3 (three) times daily.  . predniSONE (DELTASONE) 50 MG tablet TAKE 1 TABLET (50 MG TOTAL) BY MOUTH DAILY WITH BREAKFAST.  [name] tiotropium (SPIRIVA HANDIHALER) 18 MCG inhalation capsule Place 1 capsule (18 mcg total) into inhaler and inhale daily.  . verapamil (VERELAN PM) 240 MG 24 hr capsule Take 480 mg by mouth at bedtime.  . ziprasidone (GEODON) 80 MG capsule Take 1 capsule (80 mg total) by mouth at bedtime.  . [DISCONTINUED] KLOR-CON M20 20 MEQ tablet Take 20 mEq by mouth 2 (two) times daily.     Allergies:   Hydroxyzine; Codeine; and Sulfa antibiotics   Social History   Social History  . Marital status: Widowed    Spouse name: N/A  . Number of children: 3  . Years of education: N/A   Occupational History  . disabled     factory production   Social History Main Topics  . Smoking status: Current Every Day Smoker    Packs/day: 0.50    Years: 37.00    Types: Cigarettes    Start date: 01/16/1977  . Smokeless tobacco: Never Used     Comment: Previous 2 PPD menthol full flavor  . Alcohol use No  . Drug use: No  . Sexual activity: Not Currently    Birth control/ protection: Surgical   Other Topics Concern  . None   Social History Narrative  . None     Family History:  The patient's family history includes Anxiety disorder in her sister; Bipolar disorder in her sister; Cancer in her father and mother; Depression in her brother, mother, sister, and sister; Schizophrenia in her sister.   ROS:   Please see the history of present illness.    ROS All other systems reviewed and are negative.  No flowsheet data found.     PHYSICAL EXAM:   VS:  BP 100/64   Pulse 76   Ht 5\' 4"  (1.626 m)   Wt 279 lb (126.6 kg)    BMI 47.89 kg/m    GEN: Well nourished, well developed, in no acute distress  HEENT: normal  Neck: no JVD, carotid bruits, or masses Cardiac: RRR; no murmurs, rubs, or gallops,no edema.  Intact distal pulses bilaterally.  Respiratory:  clear to auscultation bilaterally, normal work of breathing GI: soft, nontender, nondistended, + BS  MS: no deformity or atrophy  Skin: warm and dry, no rash Neuro:  Alert and Oriented x 3, Strength and sensation are intact Psych: euthymic mood, full affect  Wt Readings from Last 3 Encounters:  03/15/16 279 lb (126.6 kg)  02/18/16 275 lb 12.8 oz (125.1 kg)  12/31/15 252 lb (114.3 kg)      Studies/Labs Reviewed:   EKG:  EKG is not ordered today.   Recent Labs: 11/24/2015: ALT 12; B Natriuretic Peptide 68.9 11/29/2015: Magnesium 1.8 12/31/2015: Hemoglobin 9.8 03/15/2016: BUN 20; Creatinine, Ser 1.33; NT-Pro BNP 29; Platelets 399; Potassium 5.2; Sodium 141; TSH 0.563   Lipid Panel    Component Value Date/Time   CHOL 135 12/04/2015 0635   TRIG 90 12/04/2015 0635   HDL 55 12/04/2015 0635   CHOLHDL 2.5 12/04/2015 0635   VLDL 18 12/04/2015 0635   LDLCALC 62 12/04/2015 0635   LDLDIRECT 52 04/05/2012 1210    Additional studies/ records that were reviewed today include:  none    ASSESSMENT:    1. Shortness of breath   2. Chronic diastolic heart failure ([place])   3. Benign essential HTN      PLAN:  In order of problems listed above:  1. SOB - likely multifactorial from what sounds to be a recent COPD exacerbation as well as acute on chronic diastolic CHF as her weight has continued to increase along with LE edema.  Her SOB is improved with increased doses of Lasix.  I will repeat a 2D echo to make sure LVF remains stable.  I will check a BNP to determine if she is still volume overloaded although does not appear to be on exam.  I will check a TSH to rule out hypothyroidism, check CBC to assess for anemia as cause of SOB.  2.  Chronic  diastolic CHF - she does not appear volume overloaded on exam today.  Her weight is up but could be due to increased caloric intake.  Will continue current dose of Lasix and check BMET.   3.  HTN - BP controlled on current meds.  She will continue on Amlodipine and ACE I . [name] on Verapamil as well as amlodipine and I recommended stopping on of the CCB but she was adamant that she was not coming off either of them because her BP had not been controlled in the past.    [name] going to followup in 6 months with Dr. Marlou Porch as long as echo is normal.     Medication Adjustments/Labs and Tests Ordered: Current medicines are reviewed at length with the patient today.  Concerns regarding medicines are outlined above.  Medication changes, Labs and Tests ordered today are listed in the Patient Instructions below.  Patient Instructions  Medication Instructions:  Your physician recommends that you continue on your current medications as directed. Please refer to the Current Medication list given to you today.   Labwork: TODAY: BMET, CBC, TSH, BNP  Testing/Procedures: Your physician has requested that you have an echocardiogram. Echocardiography is a painless test that uses sound waves to create images of your heart. It provides your doctor with information about the size and shape of your heart and how well your heart's chambers and valves are working. This procedure takes approximately one hour. There are no restrictions for this procedure.  Follow-Up: Your physician wants you to follow-up in: 6 months with Dr. Marlou Porch. You will receive a reminder letter in the mail two months in advance. If you don't  receive a letter, please call our office to schedule the follow-up appointment.   Any Other Special Instructions Will Be Listed Below (If Applicable).     If you need a refill on your cardiac medications before your next appointment, please call your pharmacy.      Signed, Fransico Him, MD    03/22/2016 9:25 PM    [facility] [place], [place], [place]  [address] Phone: [phone]; Fax: [phone]

## 2016-03-16 LAB — CBC WITH DIFFERENTIAL/PLATELET
Basophils Absolute: 0 10*3/uL (ref 0.0–0.2)
Basos: 0 %
EOS (ABSOLUTE): 0 10*3/uL (ref 0.0–0.4)
Eos: 0 %
Hematocrit: 36.7 % (ref 34.0–46.6)
Hemoglobin: 11.6 g/dL (ref 11.1–15.9)
Immature Grans (Abs): 0 10*3/uL (ref 0.0–0.1)
Immature Granulocytes: 0 %
Lymphocytes Absolute: 0.8 10*3/uL (ref 0.7–3.1)
Lymphs: 6 %
MCH: 27.4 pg (ref 26.6–33.0)
MCHC: 31.6 g/dL (ref 31.5–35.7)
MCV: 87 fL (ref 79–97)
Monocytes Absolute: 0.1 10*3/uL (ref 0.1–0.9)
Monocytes: 1 %
Neutrophils Absolute: 13 10*3/uL — ABNORMAL HIGH (ref 1.4–7.0)
Neutrophils: 93 %
Platelets: 399 10*3/uL — ABNORMAL HIGH (ref 150–379)
RBC: 4.23 x10E6/uL (ref 3.77–5.28)
RDW: 16.7 % — ABNORMAL HIGH (ref 12.3–15.4)
WBC: 14.1 10*3/uL — ABNORMAL HIGH (ref 3.4–10.8)

## 2016-03-16 LAB — BASIC METABOLIC PANEL
BUN/Creatinine Ratio: 15 (ref 9–23)
BUN: 20 mg/dL (ref 6–24)
CO2: 27 mmol/L (ref 18–29)
Calcium: 9.7 mg/dL (ref 8.7–10.2)
Chloride: 95 mmol/L — ABNORMAL LOW (ref 96–106)
Creatinine, Ser: 1.33 mg/dL — ABNORMAL HIGH (ref 0.57–1.00)
GFR calc Af Amer: 52 mL/min/{1.73_m2} — ABNORMAL LOW (ref >59)
GFR calc non Af Amer: 45 mL/min/{1.73_m2} — ABNORMAL LOW (ref >59)
Glucose: 99 mg/dL (ref 65–99)
Potassium: 5.2 mmol/L (ref 3.5–5.2)
Sodium: 141 mmol/L (ref 134–144)

## 2016-03-16 LAB — TSH: TSH: 0.563 u[IU]/mL (ref 0.450–4.500)

## 2016-03-16 LAB — PRO B NATRIURETIC PEPTIDE: NT-Pro BNP: 29 pg/mL (ref 0–287)

## 2016-03-17 ENCOUNTER — Telehealth: Payer: Self-pay | Admitting: Cardiology

## 2016-03-17 DIAGNOSIS — E875 Hyperkalemia: Secondary | ICD-10-CM

## 2016-03-17 MED ORDER — KLOR-CON M20 20 MEQ PO TBCR: 20.0000 meq | EXTENDED_RELEASE_TABLET | Freq: Every day | ORAL | 0 refills | Status: DC

## 2016-03-17 NOTE — Telephone Encounter (Signed)
Reviewed lab results and recommendations per [name].  Pt will have labs on 03/24/16.  Pt verbalized understanding and was in agreement with this plan.

## 2016-03-17 NOTE — Telephone Encounter (Signed)
New Message   Pt calling for lab results.

## 2016-03-20 ENCOUNTER — Telehealth: Payer: Self-pay | Admitting: *Deleted

## 2016-03-20 NOTE — Telephone Encounter (Signed)
Received message on Nurse line from Pharmacist at [facility] [phone]) Dr. Lorenso Courier wrote for potassium 10 mEq bid on 12/16/2015 but pharmacy dispensed 3 month supply of potassium 20 mEq bid on 12/25/2015. Would like Dr. Lorenso Courier to call Pharmacist to discuss. Hubbard Hartshorn, RN, BSN

## 2016-03-21 NOTE — Telephone Encounter (Signed)
[facility] Pharmacist called again today regarding the Klor-Con 20 mEq.  They are requesting a call back ASAP.  Review record, patient's Cardiologist filled medication 03/17/16 for 20 mEq once daily.  Please give [facility] pharmacy a call ASAP. Message forward to PCP and message sent via Amion.  Derl Barrow, RN

## 2016-03-21 NOTE — Telephone Encounter (Signed)
Called pharmacy. It seems that they had dispensed Klor-Con 24mEq BID instead of 8mEQ  BID in November and wanted to notify the clinic and see where to proceed from there. I noted the patient recently had blood work and was transitioned to [employer] 20 mEq tablet once daily (which was sent in to the [facility] by her pharmacy).  She voiced understanding.  Archie Patten, MD [facility] Resident  03/21/2016, 8:52 PM

## 2016-03-22 ENCOUNTER — Other Ambulatory Visit (HOSPITAL_COMMUNITY): Payer: Self-pay | Admitting: Psychiatry

## 2016-03-22 DIAGNOSIS — F200 Paranoid schizophrenia: Secondary | ICD-10-CM

## 2016-03-22 DIAGNOSIS — I10 Essential (primary) hypertension: Secondary | ICD-10-CM

## 2016-03-22 HISTORY — DX: Essential (primary) hypertension: I10

## 2016-03-24 ENCOUNTER — Other Ambulatory Visit: Payer: Self-pay | Admitting: *Deleted

## 2016-03-24 ENCOUNTER — Other Ambulatory Visit: Payer: Medicare HMO | Admitting: *Deleted

## 2016-03-24 DIAGNOSIS — R0602 Shortness of breath: Secondary | ICD-10-CM | POA: Diagnosis not present

## 2016-03-24 DIAGNOSIS — J440 Chronic obstructive pulmonary disease with acute lower respiratory infection: Secondary | ICD-10-CM | POA: Diagnosis not present

## 2016-03-24 DIAGNOSIS — E875 Hyperkalemia: Secondary | ICD-10-CM

## 2016-03-24 DIAGNOSIS — J189 Pneumonia, unspecified organism: Secondary | ICD-10-CM | POA: Diagnosis not present

## 2016-03-24 DIAGNOSIS — G4733 Obstructive sleep apnea (adult) (pediatric): Secondary | ICD-10-CM | POA: Diagnosis not present

## 2016-03-24 DIAGNOSIS — J45909 Unspecified asthma, uncomplicated: Secondary | ICD-10-CM | POA: Diagnosis not present

## 2016-03-24 DIAGNOSIS — R062 Wheezing: Secondary | ICD-10-CM | POA: Diagnosis not present

## 2016-03-24 NOTE — Patient Outreach (Signed)
[facility]) Care Management  03/24/2016  Tiffany [name] 10-14-1960 [id number]   Voice message received yesterday from member requesting a call back.  Call placed to member today, she state that she was told that she had congestive heart failure and that she will need to see a heart specialist.  She report that she has a $45 copay and is unable to afford it.  [name] requesting financial assistance.  Noted per chart, member already had office visit with cardiologist on 2/28, has echo scheduled for 3/20.  [name] made aware that [facility] is unable to provide funds for copay.  She verbalizes understanding and is appreciative of call back.  Will not reopen case at this time.  [name], [place], MSN [place] [phone]

## 2016-03-27 ENCOUNTER — Ambulatory Visit: Payer: Self-pay | Admitting: Gastroenterology

## 2016-03-27 LAB — BASIC METABOLIC PANEL
BUN/Creatinine Ratio: 17 (ref 9–23)
BUN: 26 mg/dL — ABNORMAL HIGH (ref 6–24)
CO2: 29 mmol/L (ref 18–29)
Calcium: 9.2 mg/dL (ref 8.7–10.2)
Chloride: 94 mmol/L — ABNORMAL LOW (ref 96–106)
Creatinine, Ser: 1.57 mg/dL — ABNORMAL HIGH (ref 0.57–1.00)
GFR calc Af Amer: 42 mL/min/{1.73_m2} — ABNORMAL LOW (ref >59)
GFR calc non Af Amer: 37 mL/min/{1.73_m2} — ABNORMAL LOW (ref >59)
Glucose: 87 mg/dL (ref 65–99)
Potassium: 3.7 mmol/L (ref 3.5–5.2)
Sodium: 140 mmol/L (ref 134–144)

## 2016-03-28 ENCOUNTER — Other Ambulatory Visit (HOSPITAL_COMMUNITY): Payer: Self-pay

## 2016-03-28 ENCOUNTER — Telehealth: Payer: Self-pay | Admitting: Family Medicine

## 2016-03-28 ENCOUNTER — Other Ambulatory Visit: Payer: Self-pay | Admitting: Family Medicine

## 2016-03-28 DIAGNOSIS — F200 Paranoid schizophrenia: Secondary | ICD-10-CM

## 2016-03-28 MED ORDER — DIVALPROEX SODIUM ER 250 MG PO TB24: 250.0000 mg | ORAL_TABLET | Freq: Every day | ORAL | 1 refills | Status: DC

## 2016-03-28 NOTE — Telephone Encounter (Signed)
Pt informed but she said she can't do this because it doesn't work. Please advise. She will call back and make an appt with you. Deseree Kennon Holter, CMA

## 2016-03-28 NOTE — Telephone Encounter (Signed)
Patient's kidney function noted to up at her last cardiologist appt. Apparently patient is taking Naproxen daily (last prescribed with 1 refill in 11/17 on her discharge from behavioral health).    Please advised her to start taking Tylenol 500mg  as needed instead of Naproxen and her her follow up in our clinic within the next few weeks to discuss her osteoarthritis.  Thanks, Archie Patten, MD [facility] Family Medicine Resident  03/28/2016, 2:35 PM

## 2016-03-29 NOTE — Telephone Encounter (Signed)
We will discuss her options when she follows up with me (as it depends on the location of her pain, etc).   Thanks, Archie Patten, MD [facility] Family Medicine Resident  03/29/2016, 9:48 AM

## 2016-03-30 ENCOUNTER — Other Ambulatory Visit (HOSPITAL_COMMUNITY): Payer: Self-pay | Admitting: Psychiatry

## 2016-03-30 DIAGNOSIS — F200 Paranoid schizophrenia: Secondary | ICD-10-CM

## 2016-03-31 ENCOUNTER — Other Ambulatory Visit (HOSPITAL_COMMUNITY): Payer: Self-pay | Admitting: Psychiatry

## 2016-03-31 ENCOUNTER — Ambulatory Visit (INDEPENDENT_AMBULATORY_CARE_PROVIDER_SITE_OTHER): Payer: Medicare HMO | Admitting: Psychiatry

## 2016-03-31 ENCOUNTER — Encounter (HOSPITAL_COMMUNITY): Payer: Self-pay | Admitting: Psychiatry

## 2016-03-31 VITALS — BP 138/84 | HR 92 | Ht 64.0 in | Wt 284.0 lb

## 2016-03-31 DIAGNOSIS — F17210 Nicotine dependence, cigarettes, uncomplicated: Secondary | ICD-10-CM

## 2016-03-31 DIAGNOSIS — F419 Anxiety disorder, unspecified: Secondary | ICD-10-CM

## 2016-03-31 DIAGNOSIS — Z7982 Long term (current) use of aspirin: Secondary | ICD-10-CM | POA: Diagnosis not present

## 2016-03-31 DIAGNOSIS — Z79899 Other long term (current) drug therapy: Secondary | ICD-10-CM | POA: Diagnosis not present

## 2016-03-31 DIAGNOSIS — F1410 Cocaine abuse, uncomplicated: Secondary | ICD-10-CM | POA: Diagnosis not present

## 2016-03-31 DIAGNOSIS — Z818 Family history of other mental and behavioral disorders: Secondary | ICD-10-CM | POA: Diagnosis not present

## 2016-03-31 DIAGNOSIS — F200 Paranoid schizophrenia: Secondary | ICD-10-CM

## 2016-03-31 MED ORDER — ESCITALOPRAM OXALATE 20 MG PO TABS: 20.0000 mg | ORAL_TABLET | Freq: Every day | ORAL | 2 refills | Status: DC

## 2016-03-31 MED ORDER — BENZTROPINE MESYLATE 0.5 MG PO TABS: 0.5000 mg | ORAL_TABLET | Freq: Every day | ORAL | 2 refills | Status: DC

## 2016-03-31 MED ORDER — ZIPRASIDONE HCL 80 MG PO CAPS: 80.0000 mg | ORAL_CAPSULE | Freq: Every day | ORAL | 2 refills | Status: DC

## 2016-03-31 MED ORDER — DIVALPROEX SODIUM ER 500 MG PO TB24: 500.0000 mg | ORAL_TABLET | Freq: Every day | ORAL | 2 refills | Status: DC

## 2016-03-31 MED ORDER — MIRTAZAPINE 30 MG PO TABS: 30.0000 mg | ORAL_TABLET | Freq: Every day | ORAL | 2 refills | Status: DC

## 2016-03-31 NOTE — Progress Notes (Signed)
[place] MD/PA/NP OP Progress Note  03/31/2016 9:02 AM Tiffany [name]  MRN:  [id number]  Chief Complaint:  Subjective:  I am sleeping better but I still feel anxious and paranoid.  I'm very concerned about my physical health.  HPI: Tiffany [name] came for her follow-up appointment.  [name] sleeping better since we added [name] but she continued to feel nervous anxious and paranoid.  Recently she's seen her primary care physician and she was told that she may need to see a cardiologist and nephrologist because her kidney function test are not improving.  [name] very nervous about that.  She also need to see a cardiologist and pulmonologist for her heart and lung condition.  [name] using oxygen at home.  Though she denies any hallucination but feel anxious and nervous and does not want to die.  She like [name] as it is helping sleep 5-6 hours.  Her anger is also improved from the past.  [name] not as agitated as before.  Her cousin and her girlfriend moved out and living situation is improved.  There are still 3 family member living in the house but she is getting along with them much better.  She has mild tremors but she also complaining of dry mouth.  She has no tremors or shakes.  [name] compliant with Geodon, Remeron, Lexapro and [name].  She denies any suicidal thoughts or homicidal thought.  [name] seeing [name] for counseling.  She feels proud that she stop smoking marijuana and clean from using cocaine.  Though she endorse lack of energy and getting easily tired.    Visit Diagnosis:    ICD-9-CM ICD-10-CM   1. Paranoid schizophrenia ([place]) 295.30 F20.0 escitalopram (LEXAPRO) 20 MG tablet     mirtazapine (REMERON) 30 MG tablet     ziprasidone (GEODON) 80 MG capsule     divalproex ([name] ER) 500 MG 24 hr tablet     benztropine (COGENTIN) 0.5 MG tablet    Past Psychiatric History: Reviewed. Patient has multiple psychiatric hospitalization due to relapse into drug use and mental illness. Her last  psychiatric hospitalization was in November 2017 at [facility]. She remember having psychiatric illness started in 03-14-1984 when her daughter died due to cancer. She had tried Abilify, Risperdal , Wellbutrin , Xanax , Klonopin, trazodone, BuSpar in the past. She was seeing psychiatrist at [facility] . Patient denies any history of suicidal attempt but reported history of suicidal thoughts. She had history of paranoia, delusion, anxiety and manic-like symptoms. In the past she was high dose of geodon but reduced due to increase QTC.  Past Medical History:  Past Medical History:  Diagnosis Date  . Anxiety   . Asthma   . Cocaine abuse   . Complication of anesthesia    decreased bp, decreased heart rate  . Diabetes mellitus    class 2  . Disorder of nervous system   . Emphysema   . Heart attack   . Hyperlipidemia LDL goal <70   . Hypertension   . Incontinence   . Manic depression ([place])   . Pneumonia 06/2015  . Seasonal allergies   . Seizures ([place])   . Sinus trouble   . Sleep apnea   . Stroke [facility])     Past Surgical History:  Procedure Laterality Date  . ABDOMINAL HYSTERECTOMY    . CESAREAN SECTION    . HIATAL HERNIA REPAIR      Family Psychiatric History: Reviewed.  Family History:  Family  History  Problem Relation Age of Onset  . Cancer Father     prostate  . Cancer Mother     lung  . Depression Mother   . Depression Sister   . Anxiety disorder Sister   . Schizophrenia Sister   . Bipolar disorder Sister   . Depression Sister   . Depression Brother   . Heart failure      cousin    Social History:  Social History   Social History  . Marital status: Widowed    Spouse name: N/A  . Number of children: 3  . Years of education: N/A   Occupational History  . disabled     factory production   Social History Main Topics  . Smoking status: Current Every Day Smoker    Packs/day: 0.50    Years: 37.00    Types: Cigarettes    Start date: 01/16/1977  .  Smokeless tobacco: Never Used     Comment: Previous 2 PPD menthol full flavor  . Alcohol use No  . Drug use: No  . Sexual activity: Not Currently    Birth control/ protection: Surgical   Other Topics Concern  . None   Social History Narrative  . None    Allergies:  Allergies  Allergen Reactions  . Hydroxyzine Other (See Comments)    unknown  . Codeine Nausea And Vomiting  . Sulfa Antibiotics Itching    Metabolic Disorder Labs: Recent Results (from the past 2160 hour(s))  BASIC METABOLIC PANEL WITH GFR     Status: Abnormal   Collection Time: 02/18/16 11:46 AM  Result Value Ref Range   Sodium 138 135 - 146 mmol/L   Potassium 4.5 3.5 - 5.3 mmol/L   Chloride 101 98 - 110 mmol/L   CO2 32 (H) 20 - 31 mmol/L   Glucose, Bld 89 65 - 99 mg/dL   BUN 22 7 - 25 mg/dL   Creat 1.22 (H) 0.50 - 1.05 mg/dL    Comment:   For patients > or = 57 years of age: The upper reference limit for Creatinine is approximately 13% higher for people identified as African-American.      Calcium 9.2 8.6 - 10.4 mg/dL   GFR, Est African American 57 (L) >=60 mL/min   GFR, Est Non African American 50 (L) >=60 mL/min  Basic metabolic panel     Status: Abnormal   Collection Time: 03/15/16  3:14 PM  Result Value Ref Range   Glucose 99 65 - 99 mg/dL   BUN 20 6 - 24 mg/dL   Creatinine, Ser 1.33 (H) 0.57 - 1.00 mg/dL   GFR calc non Af Amer 45 (L) >59 mL/min/1.73   GFR calc Af Amer 52 (L) >59 mL/min/1.73   BUN/Creatinine Ratio 15 9 - 23   Sodium 141 134 - 144 mmol/L   Potassium 5.2 3.5 - 5.2 mmol/L   Chloride 95 (L) 96 - 106 mmol/L   CO2 27 18 - 29 mmol/L   Calcium 9.7 8.7 - 10.2 mg/dL  CBC with Differential/Platelet     Status: Abnormal   Collection Time: 03/15/16  3:14 PM  Result Value Ref Range   WBC 14.1 (H) 3.4 - 10.8 x10E3/uL   RBC 4.23 3.77 - 5.28 x10E6/uL   Hemoglobin 11.6 11.1 - 15.9 g/dL   Hematocrit 36.7 34.0 - 46.6 %   MCV 87 79 - 97 fL   MCH 27.4 26.6 - 33.0 pg   MCHC 31.6 31.5 -  35.7 g/dL  RDW 16.7 (H) 12.3 - 15.4 %   Platelets 399 (H) 150 - 379 x10E3/uL   Neutrophils 93 Not Estab. %   Lymphs 6 Not Estab. %   Monocytes 1 Not Estab. %   Eos 0 Not Estab. %   Basos 0 Not Estab. %   Neutrophils Absolute 13.0 (H) 1.4 - 7.0 x10E3/uL   Lymphocytes Absolute 0.8 0.7 - 3.1 x10E3/uL   Monocytes Absolute 0.1 0.1 - 0.9 x10E3/uL   EOS (ABSOLUTE) 0.0 0.0 - 0.4 x10E3/uL   Basophils Absolute 0.0 0.0 - 0.2 x10E3/uL   Immature Granulocytes 0 Not Estab. %   Immature Grans (Abs) 0.0 0.0 - 0.1 x10E3/uL  TSH     Status: None   Collection Time: 03/15/16  3:14 PM  Result Value Ref Range   TSH 0.563 0.450 - 4.500 uIU/mL  Pro b natriuretic peptide (BNP)     Status: None   Collection Time: 03/15/16  3:14 PM  Result Value Ref Range   NT-Pro BNP 29 0 - 287 pg/mL    Comment: The following cut-points have been suggested for the use of proBNP for the diagnostic evaluation of heart failure (HF) in patients with acute dyspnea: Modality                     Age           Optimal Cut                            (years)            Point ------------------------------------------------------ Diagnosis (rule in HF)        <50            450 pg/mL                           50 - 75            900 pg/mL                               >75           1800 pg/mL Exclusion (rule out HF)  Age independent     300 pg/mL   Basic Metabolic Panel (BMET)     Status: Abnormal   Collection Time: 03/24/16 10:47 AM  Result Value Ref Range   Glucose 87 65 - 99 mg/dL    Comment: Specimen received in contact with cells. No visible hemolysis present. However GLUC may be decreased and K increased. Clinical correlation indicated.    BUN 26 (H) 6 - 24 mg/dL   Creatinine, Ser 1.57 (H) 0.57 - 1.00 mg/dL   GFR calc non Af Amer 37 (L) >59 mL/min/1.73   GFR calc Af Amer 42 (L) >59 mL/min/1.73   BUN/Creatinine Ratio 17 9 - 23   Sodium 140 134 - 144 mmol/L   Potassium 3.7 3.5 - 5.2 mmol/L    Comment: Specimen  received in contact with cells. No visible hemolysis present. However GLUC may be decreased and K increased. Clinical correlation indicated.    Chloride 94 (L) 96 - 106 mmol/L   CO2 29 18 - 29 mmol/L   Calcium 9.2 8.7 - 10.2 mg/dL   Lab Results  Component Value Date   HGBA1C 5.6 11/25/2015   MPG 114 11/25/2015   MPG 123 (  H) 04/24/2011   No results found for: PROLACTIN Lab Results  Component Value Date   CHOL 135 12/04/2015   TRIG 90 12/04/2015   HDL 55 12/04/2015   CHOLHDL 2.5 12/04/2015   VLDL 18 12/04/2015   LDLCALC 62 12/04/2015   LDLCALC 54 07/15/2013     Current Medications: Current Outpatient Prescriptions  Medication Sig Dispense Refill  . ACCU-CHEK SOFTCLIX LANCETS lancets TEST FOUR TIMES DAILY 400 each 12  . albuterol (PROVENTIL HFA;VENTOLIN HFA) 108 (90 Base) MCG/ACT inhaler Inhale 2 puffs into the lungs every 6 (six) hours as needed. 1 Inhaler 6  . albuterol (PROVENTIL) (2.5 MG/3ML) 0.083% nebulizer solution Take 3 mLs (2.5 mg total) by nebulization every 6 (six) hours as needed for wheezing or shortness of breath. 150 mL 1  . Alcohol Swabs (B-D SINGLE USE SWABS REGULAR) PADS CHECK CAPILLARY BLOOD GLUCOSE ONE TIME DAILY 100 each 3  . amLODipine (NORVASC) 5 MG tablet Take 10 mg by mouth daily.    [name] aspirin 81 MG EC tablet Take 1 tablet (81 mg total) by mouth daily. Swallow whole. 30 tablet 1  . atorvastatin (LIPITOR) 40 MG tablet Take 1 tablet (40 mg total) by mouth daily. 30 tablet 1  . benztropine (COGENTIN) 1 MG tablet Take 1 tablet (1 mg total) by mouth daily. 30 tablet 0  . Blood Glucose Monitoring Suppl (ACCU-CHEK AVIVA PLUS) w/Device KIT CHECK BLOOD SUGAR THREE TIMES DAILY 1 kit 0  . BREO ELLIPTA 100-25 MCG/INH AEPB INHALE 1 PUFF EVERY DAY 180 each 0  . divalproex ([name] ER) 250 MG 24 hr tablet Take 1 tablet (250 mg total) by mouth daily. 30 tablet 1  . doxycycline (VIBRA-TABS) 100 MG tablet Take 1 tablet (100 mg total) by mouth 2 (two) times daily. 20  tablet 0  . escitalopram (LEXAPRO) 20 MG tablet Take 20 mg by mouth daily.    . furosemide (LASIX) 40 MG tablet Take 80 mg by mouth 2 (two) times daily.    [name] gabapentin (NEURONTIN) 600 MG tablet Take 1 tablet (600 mg total) by mouth 3 (three) times daily. 90 tablet 1  . glucose blood test strip Use to check blood sugar 3 times daily. 100 each 12  . KLOR-CON M20 20 MEQ tablet Take 1 tablet (20 mEq total) by mouth daily. 90 tablet 0  . lisinopril (PRINIVIL,ZESTRIL) 40 MG tablet Take 1 tablet (40 mg total) by mouth daily. 30 tablet 1  . metFORMIN (GLUCOPHAGE-XR) 500 MG 24 hr tablet TAKE 1 TABLET TWICE DAILY 180 tablet 0  . mirtazapine (REMERON) 30 MG tablet Take 30 mg by mouth at bedtime.    . montelukast (SINGULAIR) 10 MG tablet Take 1 tablet (10 mg total) by mouth at bedtime. 30 tablet 1  . Multiple Vitamins-Minerals (MULTIVITAMIN WITH MINERALS) tablet Take 1 tablet by mouth daily. Reported on 07/28/2015 30 tablet 1  . naproxen (NAPROSYN) 500 MG tablet Take 1 tablet (500 mg total) by mouth 3 (three) times daily as needed for mild pain. 90 tablet 1  . omeprazole (PRILOSEC) 40 MG capsule Take 1 capsule (40 mg total) by mouth daily. 30 capsule 1  . oxybutynin (DITROPAN) 5 MG tablet Take 1 tablet (5 mg total) by mouth 3 (three) times daily. 90 tablet 1  . predniSONE (DELTASONE) 50 MG tablet TAKE 1 TABLET (50 MG TOTAL) BY MOUTH DAILY WITH BREAKFAST. 5 tablet 0  . tiotropium (SPIRIVA HANDIHALER) 18 MCG inhalation capsule Place 1 capsule (18 mcg total) into inhaler and inhale  daily. 90 capsule 1  . verapamil (VERELAN PM) 240 MG 24 hr capsule Take 480 mg by mouth at bedtime.    . ziprasidone (GEODON) 80 MG capsule Take 1 capsule (80 mg total) by mouth at bedtime. 30 capsule 2   No current facility-administered medications for this visit.     Neurologic: Headache: No Seizure: No Paresthesias: No  Musculoskeletal: Strength & Muscle Tone: within normal limits Gait & Station: normal Patient leans:  N/A  Psychiatric Specialty Exam: Review of Systems  Constitutional: Positive for malaise/fatigue.  Respiratory: Positive for shortness of breath.   Gastrointestinal:       Dry mouth  Musculoskeletal: Positive for back pain and joint pain.  Neurological: Positive for tingling.  Psychiatric/Behavioral: The patient is nervous/anxious.     Blood pressure 138/84, pulse 92, height _0  (1.626 m), weight 284 lb (128.8 kg).Body mass index is 48.75 kg/m.  General Appearance: Casual  Eye Contact:  Fair  Speech:  Slow  Volume:  Normal  Mood:  Anxious  Affect:  Constricted  Thought Process:  Goal Directed  Orientation:  Full (Time, Place, and Person)  Thought Content: Rumination   Suicidal Thoughts:  No  Homicidal Thoughts:  No  Memory:  Immediate;   Fair Recent;   Fair Remote;   Fair  Judgement:  Good  Insight:  Good  Psychomotor Activity:  Decreased  Concentration:  Concentration: Good and Attention Span: Good  Recall:  Good  Fund of Knowledge: Good  Language: Good  Akathisia:  No  Handed:  Right  AIMS (if indicated):  0  Assets:  Communication Skills Desire for Improvement Housing Social Support  ADL's:  Intact  Cognition: WNL  Sleep:  fair   Assessment: Schizophrenia chronic paranoid type.  Anxiety disorder NOS.  Cocaine abuse in partial remission  Plan: I review her collateral information from other providers including recent blood work results.  Her creatinine is 1.5 and she is in process of seeing nephrologist.  [name] concerned about her health issues.  Though she feels better with the [name] but she still feels sometimes anxious and nervous.  We discussed due to her multiple health issues including increased QTC and diabetes restricted to add more medication.  However I recommended to try [name] 500 to help her anxiety and paranoid thinking.  I would also reduce Cogentin 0.5 mg to help dry mouth which she is complaining about.  Patient does not want to reduce or  change her current psychiatric medication.  Encouraged to see [name] regularly for coping skills.  Discussed medication side effects and recommended to call us back if she has any question, concern or if she feels worsening of the symptom.  Discuss safety plan that anytime having active suicidal thoughts or homicidal thoughts continue to call 911 or go to the local emergency room.  Follow-up in 3 months. Jaques Mineer T., MD 03/31/2016, 9:02 AM

## 2016-04-04 ENCOUNTER — Ambulatory Visit (INDEPENDENT_AMBULATORY_CARE_PROVIDER_SITE_OTHER): Payer: Medicare HMO | Admitting: Clinical

## 2016-04-04 ENCOUNTER — Other Ambulatory Visit: Payer: Self-pay

## 2016-04-04 ENCOUNTER — Encounter (HOSPITAL_COMMUNITY): Payer: Self-pay | Admitting: Clinical

## 2016-04-04 ENCOUNTER — Ambulatory Visit (HOSPITAL_COMMUNITY): Payer: Medicare HMO | Attending: Cardiology

## 2016-04-04 DIAGNOSIS — I110 Hypertensive heart disease with heart failure: Secondary | ICD-10-CM | POA: Insufficient documentation

## 2016-04-04 DIAGNOSIS — R0602 Shortness of breath: Secondary | ICD-10-CM | POA: Diagnosis present

## 2016-04-04 DIAGNOSIS — I5032 Chronic diastolic (congestive) heart failure: Secondary | ICD-10-CM | POA: Insufficient documentation

## 2016-04-04 DIAGNOSIS — D649 Anemia, unspecified: Secondary | ICD-10-CM | POA: Insufficient documentation

## 2016-04-04 DIAGNOSIS — G4733 Obstructive sleep apnea (adult) (pediatric): Secondary | ICD-10-CM | POA: Insufficient documentation

## 2016-04-04 DIAGNOSIS — F250 Schizoaffective disorder, bipolar type: Secondary | ICD-10-CM

## 2016-04-04 DIAGNOSIS — E119 Type 2 diabetes mellitus without complications: Secondary | ICD-10-CM | POA: Diagnosis not present

## 2016-04-04 DIAGNOSIS — F1290 Cannabis use, unspecified, uncomplicated: Secondary | ICD-10-CM | POA: Diagnosis not present

## 2016-04-04 DIAGNOSIS — F1590 Other stimulant use, unspecified, uncomplicated: Secondary | ICD-10-CM

## 2016-04-04 DIAGNOSIS — Z720 Tobacco use: Secondary | ICD-10-CM | POA: Diagnosis not present

## 2016-04-04 DIAGNOSIS — F1410 Cocaine abuse, uncomplicated: Secondary | ICD-10-CM | POA: Insufficient documentation

## 2016-04-04 NOTE — Progress Notes (Signed)
THERAPIST PROGRESS NOTE  Session Time: 3:30 - 4:15  Participation Level: Active  Behavioral Response: CasualAlertDepressed   Type of Therapy: Individual Therapy  Treatment Goals addressed: improve psychiatric symptoms,   Mood stabilization, stress management  Interventions:  Motivational Interviewing, Grounding  Techniques  Summary: Tiffany [name] is a 56 y.o. female who presents with schizoaffective disorder, bipolar type and stimulate use disorder and marijuana use  Suicidal/Homicidal: No -without intent/plan  Therapist Response: [name] met with clinician for an individual session. Tiffany [name] discussed her psychiatric symptoms and her current life events. She shared that she is feeling depressed and tired. Clinician asked open ended questions and Tiffany [name] shared that she has been having a lot of health issues. She shared that her oxygen level is down due to her COPD and she thinks she might have pneumonia. She stated that she should be using her oxygen but has been inconsistent with it. She stated that she would like to quit smoking. Client and clinician discussed the options including the patch but she was currently uninterested. She stated that she will be going to the doctor. She shared that she has also been having issues with her kidneys. She stated that tomorrow she will find out if she needs to begin dialysis.  Clinician asked open ended questions and Tiffany [name] her fears and concerns. She was also able to identify areas of support - the doctors and her sponsor. She shared her medications are not currently keeping her paranoia away. She denied any drug or alcohol use. She stated her [name] was increased last week. Clinician checked with the nurse and the message was relayed that it might take a little bit longer before she sees improvements and that if she had any concerns or questions she could call the nurse. Client and clinician discussed grounding techniques. Tiffany [name] stated that she didn't  want to bother with them. Clinician encouraged her to try one with clinician, which she did. Tiffany [name] reported that it did indeed help. Tiffany [name] stated she was tired and so ended the session early  Plan: Return again in 2-3 weeks.  Diagnosis: Axis I:   schizoaffective disorder, bipolar type and stimulate use disorder and marijuana use    Jamaira Sherk A, LCSW 04/04/2016

## 2016-04-05 ENCOUNTER — Ambulatory Visit
Admission: RE | Admit: 2016-04-05 | Discharge: 2016-04-05 | Disposition: A | Discharge status: Home / Self Care | Payer: Medicare HMO | Source: Ambulatory Visit | Attending: Family Medicine | Admitting: Family Medicine

## 2016-04-05 ENCOUNTER — Other Ambulatory Visit: Payer: Self-pay | Admitting: Family Medicine

## 2016-04-05 ENCOUNTER — Telehealth: Payer: Self-pay | Admitting: Cardiology

## 2016-04-05 ENCOUNTER — Ambulatory Visit (INDEPENDENT_AMBULATORY_CARE_PROVIDER_SITE_OTHER): Payer: Medicare HMO | Admitting: Family Medicine

## 2016-04-05 ENCOUNTER — Telehealth: Payer: Self-pay | Admitting: Family Medicine

## 2016-04-05 ENCOUNTER — Encounter: Payer: Self-pay | Admitting: Family Medicine

## 2016-04-05 VITALS — BP 138/60 | HR 84 | Temp 99.3°F | Ht 64.0 in | Wt 278.0 lb

## 2016-04-05 DIAGNOSIS — I10 Essential (primary) hypertension: Secondary | ICD-10-CM | POA: Diagnosis not present

## 2016-04-05 DIAGNOSIS — R0602 Shortness of breath: Secondary | ICD-10-CM | POA: Diagnosis not present

## 2016-04-05 DIAGNOSIS — N179 Acute kidney failure, unspecified: Secondary | ICD-10-CM

## 2016-04-05 DIAGNOSIS — I509 Heart failure, unspecified: Secondary | ICD-10-CM | POA: Diagnosis not present

## 2016-04-05 DIAGNOSIS — R05 Cough: Secondary | ICD-10-CM | POA: Diagnosis not present

## 2016-04-05 MED ORDER — LEVOFLOXACIN 500 MG PO TABS: 500.0000 mg | ORAL_TABLET | Freq: Every day | ORAL | 0 refills | Status: DC

## 2016-04-05 MED ORDER — AMLODIPINE BESYLATE 5 MG PO TABS: 10.0000 mg | ORAL_TABLET | Freq: Every day | ORAL | 0 refills | Status: DC

## 2016-04-05 MED ORDER — ACETAMINOPHEN ER 650 MG PO TBCR: 650.0000 mg | EXTENDED_RELEASE_TABLET | Freq: Three times a day (TID) | ORAL | 0 refills | Status: DC | PRN

## 2016-04-05 MED ORDER — PREDNISONE 50 MG PO TABS: 50.0000 mg | ORAL_TABLET | Freq: Every day | ORAL | 0 refills | Status: DC

## 2016-04-05 NOTE — Telephone Encounter (Signed)
Follow Up:   Returning your call,she thinks it is about her Echo results.

## 2016-04-05 NOTE — Assessment & Plan Note (Signed)
DDx includes Naproxen vs Lasix given she never reduced Lasix dose over the last 1.5 months vs medical renal disease. - repeat BMET today  - advised pt to go back down to Lasix 40mg  BID  - STOP naproxen - f/u in 1-2 weeks for repeat lab work

## 2016-04-05 NOTE — Assessment & Plan Note (Addendum)
Patient with O2 ranging from 88-89%. Overall she is well appearing and this is not significant from her baseline. Noted some wheezing on exam. Leading DDx includes COPD exacerbation, pneumonia, CHF exacerbation. She did not look volume overloaded on my exam or on echo from yesterday. Unsure of dry weight. - given recently treated with doxycycline and patient's comorbid conditions, will treat with Levaquin and prednisone - continue scheduled inhalers and PRN albuterol - continue home O2 - will get BNP and a CXR - advised pt to f/u in our clinic in 2 days  - discussed strict return precautions such as worsening SOB or hypoxia, fevers, cough, new symptoms. Pt voiced understanding.  - pt should try to f/u with pulmonology

## 2016-04-05 NOTE — Assessment & Plan Note (Signed)
BP at goal.  - eventually could consider pulling off a CCB (as she's on both amlodipine and verapamil) however pt reluctant currently.

## 2016-04-05 NOTE — Telephone Encounter (Signed)
Attempted to call pt with chest x-ray results. Let voicemail for her to call clinic back.  Please relay the following information:  Her chest x-ray was consistent with a small amount of fluid in her lungs consistent with her CHF. Please have her increase her Lasix back to 2 tablets (80mg ) twice daily for 2 days. She should follow up in our clinic on Friday or sooner as needed.  Thanks, Archie Patten, MD [facility] Family Medicine Resident  04/05/2016, 7:48 PM

## 2016-04-05 NOTE — Telephone Encounter (Signed)
Pt calling to request refill of:  Name of Medication(s):  amlodipine Last date of OV:  12-31-15 Pharmacy:  [facility].  Will route refill request to Clinic RN.  Discussed with patient policy to call pharmacy for future refills.  Also, discussed refills may take up to 48 hours to approve or deny.  Renella Cunas

## 2016-04-05 NOTE — Progress Notes (Signed)
Subjective: CC:f/u meds and cough/SOB HPI: Patient is a 56 y.o. female with a past medical history of substance abuse, bipolar affective disorder, COPD, and type 2 diabetes among other things presenting to clinic today for f/u meds but also notes low oxygen level.  SOB: Pt noted SOB 1 week ago. [name] on home O2, 6L [place] which is her baseline. She notes O2 saturation is87-88% over the last few days. Typically 90-92%. Noted LE swelling a few days ago. Noted chills last night. Had subjective fever. She notes intermittently body aches but notes it may be due to stopping Naproxen. No pain currently.   Producing more mucous than usual; bring up white phlegm with cough. Her weight was 284lbs on 3/16.  Doesn't weigh herself at home. No PND. Stable orthopnea.  No rhinorrhea, chest pain, watery eyes, otalgias. Endorses nasal congestion, chest congestion, frontal headache intermittently.  On Lasix 80mg  BID (was previously on 40mg  daily at her last hospital d/c, was told to increase to 80mg  BID at a visit on 2/2 for 4 days but never decreased to her baseline dose).  Using albuterol QID without improvement.  Doing Spiriva and Breo regularly.  She last saw pulmonology12/2017 (was f/u to 4 months) Last saw cardiology 2/28. Had echo yesterday.   Pain: patient notes she hasn't taken Tylenol in years b/c it didn't work. States her pain is "all over" because of her "rheumatoid arthritis" (only noted to have OA in EMR). Notes pain is in knees, hands, neck, back. Notes intermittent swelling and popping.  Last took Naproxen yesterday. Was advised to stop this and f/u with PCP due AKI.     Social History: Last smoked a few days ago  Flu Vaccine: previously declined.   ROS: All other systems reviewed and are negative.  Past Medical History Patient Active Problem List   Diagnosis Date Noted  . Benign essential HTN 03/22/2016  . Rectal bleeding 12/31/2015  . Cocaine abuse 12/17/2015  . Bipolar  affective disorder, mixed, severe, with psychotic behavior ([place]) 11/28/2015  . Cocaine use disorder, severe, dependence ([place]) 11/28/2015  . Hyponatremia 11/25/2015  . AKI (acute kidney injury) ([place]) 11/24/2015  . Blood in sputum   . Chronic diastolic heart failure ([place])   . SOB (shortness of breath)   . Hypoxia 08/19/2015  . Onychomycosis 01/21/2015  . Nasal congestion 12/17/2014  . Tobacco use disorder 07/22/2014  . COPD (chronic obstructive pulmonary disease) ([place]) 07/08/2014  . Seborrheic keratoses 12/31/2013  . Thrush 09/19/2013  . Knee pain, bilateral 01/22/2013  . Seizure ([place]) 01/04/2013  . Health care maintenance 11/25/2012  . Folliculitis 83/15/1761  . History of kidney stones 06/18/2012  . Chronic pain syndrome 06/18/2012  . Muscle spasm 06/18/2012  . Elevated troponin 04/28/2012  . Asthma with acute exacerbation 04/28/2012  . Allergic reaction 04/07/2012  . Itching 09/06/2011  . HSV infection 08/30/2011  . Dyslipidemia 04/24/2011  . Anemia 04/24/2011  . Diabetic neuropathy ([place]) 04/24/2011  . Obstructive sleep apnea 10/18/2010  . Asthma 10/18/2010  . Obesity 10/18/2010  . Type 2 diabetes mellitus ([place]) 10/18/2010    Medications- reviewed and updated  Objective: Office vital signs reviewed. BP 138/60   Pulse 84   Temp 99.3 F (37.4 C) (Oral)   Ht 5\' 4"  (1.626 m)   Wt 278 lb (126.1 kg)   SpO2 (!) 88%   BMI 47.72 kg/m    Physical Examination:  General: Awake, alert, well- nourished, NAD ENMT:  TMs intact, normal light reflex,  no erythema, no bulging. Nasal turbinates moist. MMM, Oropharynx clear without erythema or tonsillar exudate/hypertrophy Eyes: Conjunctiva non-injected. PERRL.  Cardio: RRR, no m/r/g noted. No thrill. No JVD.  Pulm: No increased WOB, talking in full sentences. Has portable O2 tank with Alpha in place. Wheezing bilaterally, no rhonchi.  Extremities: Trace-1+ pitting edema bilaterally.   Assessment/Plan: AKI (acute kidney injury)  ([place]) DDx includes Naproxen vs Lasix given she never reduced Lasix dose over the last 1.5 months vs medical renal disease. - repeat BMET today  - advised pt to go back down to Lasix 40mg  BID  - STOP naproxen - f/u in 1-2 weeks for repeat lab work  SOB (shortness of breath) Patient with O2 ranging from 88-89%. Overall she is well appearing and this is not significant from her baseline. Noted some wheezing on exam. Leading DDx includes COPD exacerbation, pneumonia, CHF exacerbation. She did not look volume overloaded on my exam or on echo from yesterday. Unsure of dry weight. - given recently treated with doxycycline and patient's comorbid conditions, will treat with Levaquin and prednisone - continue scheduled inhalers and PRN albuterol - continue home O2 - will get BNP and a CXR - advised pt to f/u in our clinic in 2 days  - discussed strict return precautions such as worsening SOB or hypoxia, fevers, cough, new symptoms. Pt voiced understanding.   Benign essential HTN BP at goal.  - eventually could consider pulling off a CCB (as she's on both amlodipine and verapamil) however pt reluctant currently.    Orders Placed This Encounter  Procedures  . DG Chest 2 View    Standing Status:   Future    Standing Expiration Date:   06/05/2017    Order Specific Question:   Reason for Exam (SYMPTOM  OR DIAGNOSIS REQUIRED)    Answer:   SOB, cough, h/o CHF and COPD on O2    Order Specific Question:   Is patient pregnant?    Answer:   No    Order Specific Question:   Preferred imaging location?    Answer:   [facility]  . Basic metabolic panel  . Brain natriuretic peptide    Meds ordered this encounter  Medications  . levofloxacin (LEVAQUIN) 500 MG tablet    Sig: Take 1 tablet (500 mg total) by mouth daily.    Dispense:  7 tablet    Refill:  0  . predniSONE (DELTASONE) 50 MG tablet    Sig: Take 1 tablet (50 mg total) by mouth daily with breakfast.    Dispense:  5 tablet     Refill:  0  . acetaminophen (TYLENOL 8 HOUR) 650 MG CR tablet    Sig: Take 1 tablet (650 mg total) by mouth every 8 (eight) hours as needed for pain.    Dispense:  60 tablet    Refill:  [place] PGY-3, [place]

## 2016-04-05 NOTE — Patient Instructions (Signed)
I have prescribed steroid and antibiotic for your cough and shortness of breath Please get the chest x-ray. Follow-up with pulmonology. If you notice that your shortness of breath is worsening, your oxygen saturation is less than 87%, or he have worsening symptoms, please seek care immediately Stop naproxen or ibuprofen. Start taking Tylenol 650 mg every 8 hours as needed for pain You should be taking Lasix 40 mg twice daily. Follow-up in our clinic in 2 days

## 2016-04-05 NOTE — Telephone Encounter (Signed)
-----   Message from Sueanne Margarita, MD sent at 04/05/2016  9:10 AM EDT ----- Echo showed normal LVF with increased stiffness of heart muscle

## 2016-04-05 NOTE — Telephone Encounter (Signed)
Informed patient of results and verbal understanding expressed.  

## 2016-04-06 LAB — BASIC METABOLIC PANEL
BUN/Creatinine Ratio: 12 (ref 9–23)
BUN: 14 mg/dL (ref 6–24)
CO2: 33 mmol/L — ABNORMAL HIGH (ref 18–29)
Calcium: 9.9 mg/dL (ref 8.7–10.2)
Chloride: 98 mmol/L (ref 96–106)
Creatinine, Ser: 1.2 mg/dL — ABNORMAL HIGH (ref 0.57–1.00)
GFR calc Af Amer: 58 mL/min/{1.73_m2} — ABNORMAL LOW (ref >59)
GFR calc non Af Amer: 51 mL/min/{1.73_m2} — ABNORMAL LOW (ref >59)
Glucose: 84 mg/dL (ref 65–99)
Potassium: 3.6 mmol/L (ref 3.5–5.2)
Sodium: 145 mmol/L — ABNORMAL HIGH (ref 134–144)

## 2016-04-06 LAB — BRAIN NATRIURETIC PEPTIDE: BNP: 27.7 pg/mL (ref 0.0–100.0)

## 2016-04-06 NOTE — Telephone Encounter (Addendum)
Spoke with patient and informed her of message below. Patient confused because she states that she has been taking 2 tablets (80mg ) twice a day, states she told MD this at her appointment, wonders if she should be increasing more than this. Patient states she would like a call back from PCP.

## 2016-04-06 NOTE — Telephone Encounter (Signed)
Patient informed of message from MD, already has appointment scheduled for tomm.

## 2016-04-06 NOTE — Telephone Encounter (Signed)
Attempted to return patient's call twice this morning, no answer. Left VM to have her call our office back.   I am aware that she was taking 80mg  (2 tablets) twice daily. At our appt yesterday I suggested going back to her normal 40mg  twice daily.  I would like her to go back to what she was doing (80mg ) twice daily. IF her shortness of breath has not started to improve with the steroid I prescribed she can take Lasix 80mg  twice daily and add an additional 40mg  dose later in the day (so she'd be taking Lasix 80mg , 80mg , and then 40mg  throughout the day).  Please have her f/u with our clinic on Friday as we discussed yesterday.  Thanks, Archie Patten, MD [facility] Resident  04/06/2016, 11:04 AM

## 2016-04-07 ENCOUNTER — Encounter: Payer: Self-pay | Admitting: Internal Medicine

## 2016-04-07 ENCOUNTER — Ambulatory Visit (INDEPENDENT_AMBULATORY_CARE_PROVIDER_SITE_OTHER): Payer: Medicare HMO | Admitting: Internal Medicine

## 2016-04-07 DIAGNOSIS — R0602 Shortness of breath: Secondary | ICD-10-CM | POA: Diagnosis not present

## 2016-04-07 NOTE — Progress Notes (Signed)
Subjective:   Patient: Tiffany [name]       Birthdate: 1960/09/14       MRN: [id number]      HPI  Tiffany [name] is a 56 y.o. female presenting for f/u for SOB.   SOB Patient most recently seen on 3/21 by Dr. Lorenso Courier for 1 week of SOB. Patient also reporting cough productive of white sputum at that time. Wheezing noted on lung exam, but patient's respiratory status did not appear much different from baseline. CXR was obtained which was suggestive of CHF exacerbation/fluid overload vs PNA, however given patient's increased risk of developing PNA she was started on levaquin in addition to prednisone. Lasix was also increased from 40mg  BID to 80mg  in AM and 40mg  in PM. Patient returns today for schedule f/u.  Patient reports that she feels her breathing has improved from two days ago. She reports increased urination in the AM after taking Lasix 80mg , but no change in urination in the evening with regular dose of 40mg . Denies LE edema. Reports wheezing even at rest. Also reports continued decreased O2 sats at home, down to 70s, even without exertion. [name] now on day 3 of levaquin and prednisone. She reports cough has persisted but is now non-productive. [name] now reporting some nasal congestion but has not taken anything to help with this. She has been taking her regular Breo and Spiriva, and has been using PRN albuterol more, up to 4 times per day. Patient is still smoking daily, and is smoking 1 ppd now.   Smoking status reviewed. Patient is current every day smoker (1 ppd).   Review of Systems See HPI.     Objective:  Physical Exam  Constitutional: [name] oriented to person, place, and time and well-developed, well-nourished, and in no distress.  HENT:  Head: Normocephalic and atraumatic.  Eyes: Conjunctivae and EOM are normal. Right eye exhibits no discharge. Left eye exhibits no discharge.  Cardiovascular: Normal rate.   Pulmonary/Chest:  Bilateral wheezes and crackles. Able to speak  in full sentences when sitting still with supplemental O2, however has difficulty completing sentences when moving even slightly (eg turning around).   Musculoskeletal:  No LE edema bilaterally  Neurological: [name] alert and oriented to person, place, and time.  Skin: Skin is warm and dry.  Psychiatric: Affect and judgment normal.      Assessment & Plan:  SOB (shortness of breath) Patient reports improvement since last visit two days ago, however lung exam seems worse, as patient now with crackles in addition to wheezes. CXR on 3/21 with vascular congestion most compatible with fluid overload. Patient now on day 3 of both levaquin and prednisone. Increased urination with increased dose of 80mg  Lasix in the AM, but no increased output with regular dose of 40mg  in the evening. Persistently decreased O2 sats at home (patient reports in the 70s), and sat 85% upon arriving at clinic, though patient walked in without home O2 tank. Also with difficulty speaking in full sentences with movement even with [place] in place. Discussed with Dr. [name] Riis, who felt that patient is appropriate either for admission today or for increase in diuretics with f/u Monday. Patient choosing to increase diuretics and f/u Monday.  - Increase to Lasix 80mg  TID (Cr at baseline on 3/21 but could consider repeat BMP at f/u) - Continue levaquin and prednisone - Continue Breo, Spiriva, albuterol PRN - Encouraged smoking cessation - Discussed calling after hours line if no increase in urination  with increased Lasix dose, and calling after hours line or immediately going to ED if worsening SOB - F/u Mon 3/26 with Dr. Jerline Pain at 10:30 (only available appt on Monday) - Will likely need admission if no improvement or worsening at f/u   Adin Hector, MD, MPH PGY-2 [place] [facility] Pager [phone]

## 2016-04-07 NOTE — Patient Instructions (Addendum)
It was nice meeting you today Ms. Drewry!  Please start taking 80 mg Lasix three times a day. This will be two tablets in the morning, two around lunch, and two in the evening.   If you find that you are not urinating more after increasing the dose, please call our clinic after hours line.   If you start to have worsening trouble breathing, please call the after hours line or go straight to the emergency room.   Please continue to take the levaquin (antibiotic) and prednisone (steroid) as you have been until you have taken all the pills.   It is very important to come to your appointment with Dr. Jerline Pain on Monday at 10:30 AM.   If you have any questions or concerns, please feel free to call the clinic.   Be well,  Dr. Avon Gully

## 2016-04-07 NOTE — Assessment & Plan Note (Addendum)
Patient reports improvement since last visit two days ago, however lung exam seems worse, as patient now with crackles in addition to wheezes. CXR on 3/21 with vascular congestion most compatible with fluid overload. Patient now on day 3 of both levaquin and prednisone. Increased urination with increased dose of 80mg  Lasix in the AM, but no increased output with regular dose of 40mg  in the evening. Persistently decreased O2 sats at home (patient reports in the 70s), and sat 85% upon arriving at clinic, though patient walked in without home O2 tank. Also with difficulty speaking in full sentences with movement even with [place] in place. Discussed with Dr. [name] Riis, who felt that patient is appropriate either for admission today or for increase in diuretics with f/u Monday. Patient choosing to increase diuretics and f/u Monday.  - Increase to Lasix 80mg  TID (Cr at baseline on 3/21 but could consider repeat BMP at f/u) - Continue levaquin and prednisone - Continue Breo, Spiriva, albuterol PRN - Encouraged smoking cessation - Discussed calling after hours line if no increase in urination with increased Lasix dose, and calling after hours line or immediately going to ED if worsening SOB - F/u Mon 3/26 with Dr. Jerline Pain at 10:30 (only available appt on Monday) - Will likely need admission if no improvement or worsening at f/u

## 2016-04-10 ENCOUNTER — Ambulatory Visit (INDEPENDENT_AMBULATORY_CARE_PROVIDER_SITE_OTHER): Payer: Medicare HMO | Admitting: Family Medicine

## 2016-04-10 ENCOUNTER — Inpatient Hospital Stay (HOSPITAL_COMMUNITY): Payer: Medicare HMO

## 2016-04-10 ENCOUNTER — Inpatient Hospital Stay (HOSPITAL_COMMUNITY)
Admission: AD | Admit: 2016-04-10 | Discharge: 2016-04-15 | DRG: 291 | Disposition: A | Discharge status: Home / Self Care | Payer: Medicare HMO | Source: Ambulatory Visit | Attending: Family Medicine | Admitting: Family Medicine

## 2016-04-10 ENCOUNTER — Encounter: Payer: Self-pay | Admitting: Family Medicine

## 2016-04-10 ENCOUNTER — Encounter (HOSPITAL_COMMUNITY): Payer: Self-pay | Admitting: General Practice

## 2016-04-10 VITALS — BP 110/68 | HR 82 | Temp 98.0°F | Ht 64.0 in | Wt 286.6 lb

## 2016-04-10 DIAGNOSIS — J9611 Chronic respiratory failure with hypoxia: Secondary | ICD-10-CM | POA: Diagnosis present

## 2016-04-10 DIAGNOSIS — Z801 Family history of malignant neoplasm of trachea, bronchus and lung: Secondary | ICD-10-CM

## 2016-04-10 DIAGNOSIS — E118 Type 2 diabetes mellitus with unspecified complications: Secondary | ICD-10-CM

## 2016-04-10 DIAGNOSIS — R0600 Dyspnea, unspecified: Secondary | ICD-10-CM | POA: Diagnosis present

## 2016-04-10 DIAGNOSIS — R0602 Shortness of breath: Secondary | ICD-10-CM | POA: Diagnosis not present

## 2016-04-10 DIAGNOSIS — K5900 Constipation, unspecified: Secondary | ICD-10-CM

## 2016-04-10 DIAGNOSIS — R935 Abnormal findings on diagnostic imaging of other abdominal regions, including retroperitoneum: Secondary | ICD-10-CM | POA: Diagnosis not present

## 2016-04-10 DIAGNOSIS — G4733 Obstructive sleep apnea (adult) (pediatric): Secondary | ICD-10-CM | POA: Diagnosis present

## 2016-04-10 DIAGNOSIS — I130 Hypertensive heart and chronic kidney disease with heart failure and stage 1 through stage 4 chronic kidney disease, or unspecified chronic kidney disease: Principal | ICD-10-CM | POA: Diagnosis present

## 2016-04-10 DIAGNOSIS — F1110 Opioid abuse, uncomplicated: Secondary | ICD-10-CM | POA: Diagnosis present

## 2016-04-10 DIAGNOSIS — J9622 Acute and chronic respiratory failure with hypercapnia: Secondary | ICD-10-CM | POA: Diagnosis present

## 2016-04-10 DIAGNOSIS — F1911 Other psychoactive substance abuse, in remission: Secondary | ICD-10-CM | POA: Diagnosis present

## 2016-04-10 DIAGNOSIS — J449 Chronic obstructive pulmonary disease, unspecified: Secondary | ICD-10-CM

## 2016-04-10 DIAGNOSIS — I5032 Chronic diastolic (congestive) heart failure: Secondary | ICD-10-CM

## 2016-04-10 DIAGNOSIS — Z79899 Other long term (current) drug therapy: Secondary | ICD-10-CM

## 2016-04-10 DIAGNOSIS — I5033 Acute on chronic diastolic (congestive) heart failure: Secondary | ICD-10-CM | POA: Diagnosis present

## 2016-04-10 DIAGNOSIS — J441 Chronic obstructive pulmonary disease with (acute) exacerbation: Secondary | ICD-10-CM | POA: Diagnosis not present

## 2016-04-10 DIAGNOSIS — F17200 Nicotine dependence, unspecified, uncomplicated: Secondary | ICD-10-CM | POA: Diagnosis not present

## 2016-04-10 DIAGNOSIS — Z9119 Patient's noncompliance with other medical treatment and regimen: Secondary | ICD-10-CM

## 2016-04-10 DIAGNOSIS — F209 Schizophrenia, unspecified: Secondary | ICD-10-CM | POA: Diagnosis present

## 2016-04-10 DIAGNOSIS — N3941 Urge incontinence: Secondary | ICD-10-CM | POA: Diagnosis present

## 2016-04-10 DIAGNOSIS — Z7984 Long term (current) use of oral hypoglycemic drugs: Secondary | ICD-10-CM

## 2016-04-10 DIAGNOSIS — Z885 Allergy status to narcotic agent status: Secondary | ICD-10-CM | POA: Diagnosis not present

## 2016-04-10 DIAGNOSIS — Z6841 Body Mass Index (BMI) 40.0 and over, adult: Secondary | ICD-10-CM | POA: Diagnosis not present

## 2016-04-10 DIAGNOSIS — N179 Acute kidney failure, unspecified: Secondary | ICD-10-CM | POA: Diagnosis not present

## 2016-04-10 DIAGNOSIS — E1122 Type 2 diabetes mellitus with diabetic chronic kidney disease: Secondary | ICD-10-CM | POA: Diagnosis not present

## 2016-04-10 DIAGNOSIS — Z8673 Personal history of transient ischemic attack (TIA), and cerebral infarction without residual deficits: Secondary | ICD-10-CM

## 2016-04-10 DIAGNOSIS — Z9981 Dependence on supplemental oxygen: Secondary | ICD-10-CM

## 2016-04-10 DIAGNOSIS — R062 Wheezing: Secondary | ICD-10-CM

## 2016-04-10 DIAGNOSIS — I5030 Unspecified diastolic (congestive) heart failure: Secondary | ICD-10-CM | POA: Diagnosis not present

## 2016-04-10 DIAGNOSIS — F1420 Cocaine dependence, uncomplicated: Secondary | ICD-10-CM | POA: Diagnosis not present

## 2016-04-10 DIAGNOSIS — I2729 Other secondary pulmonary hypertension: Secondary | ICD-10-CM | POA: Diagnosis present

## 2016-04-10 DIAGNOSIS — Z818 Family history of other mental and behavioral disorders: Secondary | ICD-10-CM

## 2016-04-10 DIAGNOSIS — E1140 Type 2 diabetes mellitus with diabetic neuropathy, unspecified: Secondary | ICD-10-CM | POA: Diagnosis present

## 2016-04-10 DIAGNOSIS — E785 Hyperlipidemia, unspecified: Secondary | ICD-10-CM | POA: Diagnosis present

## 2016-04-10 DIAGNOSIS — Z882 Allergy status to sulfonamides status: Secondary | ICD-10-CM

## 2016-04-10 DIAGNOSIS — F17210 Nicotine dependence, cigarettes, uncomplicated: Secondary | ICD-10-CM | POA: Diagnosis present

## 2016-04-10 DIAGNOSIS — Z7982 Long term (current) use of aspirin: Secondary | ICD-10-CM

## 2016-04-10 DIAGNOSIS — Z8042 Family history of malignant neoplasm of prostate: Secondary | ICD-10-CM | POA: Diagnosis not present

## 2016-04-10 DIAGNOSIS — N183 Chronic kidney disease, stage 3 (moderate): Secondary | ICD-10-CM | POA: Diagnosis present

## 2016-04-10 DIAGNOSIS — Z7951 Long term (current) use of inhaled steroids: Secondary | ICD-10-CM

## 2016-04-10 DIAGNOSIS — F319 Bipolar disorder, unspecified: Secondary | ICD-10-CM | POA: Diagnosis present

## 2016-04-10 DIAGNOSIS — I509 Heart failure, unspecified: Secondary | ICD-10-CM

## 2016-04-10 DIAGNOSIS — I2721 Secondary pulmonary arterial hypertension: Secondary | ICD-10-CM | POA: Diagnosis not present

## 2016-04-10 DIAGNOSIS — J9621 Acute and chronic respiratory failure with hypoxia: Secondary | ICD-10-CM | POA: Diagnosis present

## 2016-04-10 DIAGNOSIS — J9620 Acute and chronic respiratory failure, unspecified whether with hypoxia or hypercapnia: Secondary | ICD-10-CM | POA: Diagnosis not present

## 2016-04-10 DIAGNOSIS — J9612 Chronic respiratory failure with hypercapnia: Secondary | ICD-10-CM

## 2016-04-10 HISTORY — DX: Schizophrenia, unspecified: F20.9

## 2016-04-10 HISTORY — DX: Gastro-esophageal reflux disease without esophagitis: K21.9

## 2016-04-10 HISTORY — DX: Personal history of other medical treatment: Z92.89

## 2016-04-10 HISTORY — DX: Delusional disorders: F22

## 2016-04-10 HISTORY — DX: Dependence on supplemental oxygen: Z99.81

## 2016-04-10 HISTORY — DX: Unspecified osteoarthritis, unspecified site: M19.90

## 2016-04-10 HISTORY — DX: Type 2 diabetes mellitus without complications: E11.9

## 2016-04-10 HISTORY — DX: Obstructive sleep apnea (adult) (pediatric): Z99.89

## 2016-04-10 HISTORY — DX: Depression, unspecified: F32.A

## 2016-04-10 HISTORY — DX: Obstructive sleep apnea (adult) (pediatric): G47.33

## 2016-04-10 HISTORY — DX: Chronic kidney disease, unspecified: N18.9

## 2016-04-10 HISTORY — DX: Major depressive disorder, single episode, unspecified: F32.9

## 2016-04-10 HISTORY — DX: Unspecified chronic bronchitis: J42

## 2016-04-10 LAB — URINALYSIS, ROUTINE W REFLEX MICROSCOPIC
Bilirubin Urine: NEGATIVE
Glucose, UA: NEGATIVE mg/dL
Hgb urine dipstick: NEGATIVE
Ketones, ur: NEGATIVE mg/dL
Leukocytes, UA: NEGATIVE
Nitrite: NEGATIVE
Protein, ur: NEGATIVE mg/dL
Specific Gravity, Urine: 1.01 (ref 1.005–1.030)
pH: 5 (ref 5.0–8.0)

## 2016-04-10 LAB — COMPREHENSIVE METABOLIC PANEL
ALT: 18 U/L (ref 14–54)
AST: 20 U/L (ref 15–41)
Albumin: 3.3 g/dL — ABNORMAL LOW (ref 3.5–5.0)
Alkaline Phosphatase: 95 U/L (ref 38–126)
Anion gap: 11 (ref 5–15)
BUN: 24 mg/dL — ABNORMAL HIGH (ref 6–20)
CO2: 38 mmol/L — ABNORMAL HIGH (ref 22–32)
Calcium: 10 mg/dL (ref 8.9–10.3)
Chloride: 96 mmol/L — ABNORMAL LOW (ref 101–111)
Creatinine, Ser: 1.44 mg/dL — ABNORMAL HIGH (ref 0.44–1.00)
GFR calc Af Amer: 46 mL/min — ABNORMAL LOW (ref >60)
GFR calc non Af Amer: 40 mL/min — ABNORMAL LOW (ref >60)
Glucose, Bld: 90 mg/dL (ref 65–99)
Potassium: 3.8 mmol/L (ref 3.5–5.1)
Sodium: 145 mmol/L (ref 135–145)
Total Bilirubin: 0.4 mg/dL (ref 0.3–1.2)
Total Protein: 6.1 g/dL — ABNORMAL LOW (ref 6.5–8.1)

## 2016-04-10 LAB — CBC
HCT: 37 % (ref 36.0–46.0)
Hemoglobin: 11.1 g/dL — ABNORMAL LOW (ref 12.0–15.0)
MCH: 27.9 pg (ref 26.0–34.0)
MCHC: 30 g/dL (ref 30.0–36.0)
MCV: 93 fL (ref 78.0–100.0)
Platelets: 416 10*3/uL — ABNORMAL HIGH (ref 150–400)
RBC: 3.98 MIL/uL (ref 3.87–5.11)
RDW: 15.6 % — ABNORMAL HIGH (ref 11.5–15.5)
WBC: 14.6 10*3/uL — ABNORMAL HIGH (ref 4.0–10.5)

## 2016-04-10 LAB — TROPONIN I: Troponin I: 0.03 ng/mL (ref <0.03)

## 2016-04-10 LAB — RAPID URINE DRUG SCREEN, HOSP PERFORMED
Amphetamines: NOT DETECTED
Barbiturates: NOT DETECTED
Benzodiazepines: NOT DETECTED
Cocaine: NOT DETECTED
Opiates: POSITIVE — AB
Tetrahydrocannabinol: NOT DETECTED

## 2016-04-10 LAB — MAGNESIUM: Magnesium: 1.9 mg/dL (ref 1.7–2.4)

## 2016-04-10 LAB — BRAIN NATRIURETIC PEPTIDE: B Natriuretic Peptide: 21.3 pg/mL (ref 0.0–100.0)

## 2016-04-10 MED ORDER — POLYETHYLENE GLYCOL 3350 17 G PO PACK
17.0000 g | PACK | Freq: Two times a day (BID) | ORAL | Status: DC
  Administered 2016-04-10 – 2016-04-13 (×7): 17 g via ORAL
  Filled 2016-04-10 (×7): qty 1

## 2016-04-10 MED ORDER — AMLODIPINE BESYLATE 5 MG PO TABS
5.0000 mg | ORAL_TABLET | Freq: Every day | ORAL | Status: DC
  Administered 2016-04-11 – 2016-04-15 (×5): 5 mg via ORAL
  Filled 2016-04-10 (×6): qty 1

## 2016-04-10 MED ORDER — MONTELUKAST SODIUM 10 MG PO TABS
10.0000 mg | ORAL_TABLET | Freq: Every day | ORAL | Status: DC
Start: 2016-04-10 — End: 2016-04-15
  Administered 2016-04-10 – 2016-04-14 (×5): 10 mg via ORAL
  Filled 2016-04-10 (×5): qty 1

## 2016-04-10 MED ORDER — SODIUM CHLORIDE 0.9 % IV SOLN: 250.0000 mL | INTRAVENOUS | Status: DC | PRN

## 2016-04-10 MED ORDER — VERAPAMIL HCL ER 240 MG PO TBCR
480.0000 mg | EXTENDED_RELEASE_TABLET | Freq: Every day | ORAL | Status: DC
  Filled 2016-04-10: qty 2

## 2016-04-10 MED ORDER — POLYETHYLENE GLYCOL 3350 17 G PO PACK
17.0000 g | PACK | Freq: Every day | ORAL | Status: DC
  Filled 2016-04-10: qty 1

## 2016-04-10 MED ORDER — ATORVASTATIN CALCIUM 40 MG PO TABS
40.0000 mg | ORAL_TABLET | Freq: Every day | ORAL | Status: DC
Start: 2016-04-11 — End: 2016-04-15
  Administered 2016-04-11 – 2016-04-15 (×5): 40 mg via ORAL
  Filled 2016-04-10 (×6): qty 1

## 2016-04-10 MED ORDER — MIRTAZAPINE 30 MG PO TABS
30.0000 mg | ORAL_TABLET | Freq: Every day | ORAL | Status: DC
  Administered 2016-04-10 – 2016-04-14 (×5): 30 mg via ORAL
  Filled 2016-04-10 (×5): qty 1

## 2016-04-10 MED ORDER — DOCUSATE SODIUM 100 MG PO CAPS: 100.0000 mg | ORAL_CAPSULE | Freq: Two times a day (BID) | ORAL | Status: DC

## 2016-04-10 MED ORDER — ZIPRASIDONE HCL 80 MG PO CAPS
80.0000 mg | ORAL_CAPSULE | Freq: Every day | ORAL | Status: DC
  Administered 2016-04-10 – 2016-04-14 (×5): 80 mg via ORAL
  Filled 2016-04-10 (×6): qty 1

## 2016-04-10 MED ORDER — ADULT MULTIVITAMIN W/MINERALS CH
1.0000 | ORAL_TABLET | Freq: Every day | ORAL | Status: DC
  Administered 2016-04-11 – 2016-04-15 (×5): 1 via ORAL
  Filled 2016-04-10 (×7): qty 1

## 2016-04-10 MED ORDER — ESCITALOPRAM OXALATE 20 MG PO TABS
20.0000 mg | ORAL_TABLET | Freq: Every day | ORAL | Status: DC
  Administered 2016-04-10 – 2016-04-15 (×6): 20 mg via ORAL
  Filled 2016-04-10 (×6): qty 1

## 2016-04-10 MED ORDER — INSULIN ASPART 100 UNIT/ML ~~LOC~~ SOLN: 0.0000 [IU] | Freq: Three times a day (TID) | SUBCUTANEOUS | Status: DC

## 2016-04-10 MED ORDER — IPRATROPIUM-ALBUTEROL 0.5-2.5 (3) MG/3ML IN SOLN
3.0000 mL | Freq: Once | RESPIRATORY_TRACT | Status: DC
Start: 2016-04-10 — End: 2016-04-14

## 2016-04-10 MED ORDER — TIOTROPIUM BROMIDE MONOHYDRATE 18 MCG IN CAPS
18.0000 ug | ORAL_CAPSULE | Freq: Every day | RESPIRATORY_TRACT | Status: DC
  Administered 2016-04-11 – 2016-04-14 (×4): 18 ug via RESPIRATORY_TRACT
  Filled 2016-04-10: qty 5

## 2016-04-10 MED ORDER — SODIUM CHLORIDE 0.9% FLUSH
3.0000 mL | INTRAVENOUS | Status: DC | PRN
  Administered 2016-04-10: 3 mL via INTRAVENOUS
  Filled 2016-04-10: qty 3

## 2016-04-10 MED ORDER — SODIUM CHLORIDE 0.9% FLUSH
3.0000 mL | Freq: Two times a day (BID) | INTRAVENOUS | Status: DC
  Administered 2016-04-10 – 2016-04-15 (×10): 3 mL via INTRAVENOUS

## 2016-04-10 MED ORDER — DIVALPROEX SODIUM ER 500 MG PO TB24
500.0000 mg | ORAL_TABLET | Freq: Every day | ORAL | Status: DC
  Administered 2016-04-10 – 2016-04-14 (×5): 500 mg via ORAL
  Filled 2016-04-10 (×5): qty 1

## 2016-04-10 MED ORDER — HEPARIN SODIUM (PORCINE) 5000 UNIT/ML IJ SOLN
5000.0000 [IU] | Freq: Three times a day (TID) | INTRAMUSCULAR | Status: DC
  Administered 2016-04-10 – 2016-04-14 (×13): 5000 [IU] via SUBCUTANEOUS
  Filled 2016-04-10 (×14): qty 1

## 2016-04-10 MED ORDER — BENZTROPINE MESYLATE 1 MG PO TABS
0.5000 mg | ORAL_TABLET | Freq: Every day | ORAL | Status: DC
  Administered 2016-04-11 – 2016-04-15 (×5): 0.5 mg via ORAL
  Filled 2016-04-10 (×6): qty 1

## 2016-04-10 MED ORDER — PANTOPRAZOLE SODIUM 40 MG PO TBEC
40.0000 mg | DELAYED_RELEASE_TABLET | Freq: Every day | ORAL | Status: DC
Start: 2016-04-11 — End: 2016-04-15
  Administered 2016-04-11 – 2016-04-15 (×5): 40 mg via ORAL
  Filled 2016-04-10 (×6): qty 1

## 2016-04-10 MED ORDER — ASPIRIN EC 81 MG PO TBEC
81.0000 mg | DELAYED_RELEASE_TABLET | Freq: Every day | ORAL | Status: DC
  Administered 2016-04-11 – 2016-04-15 (×5): 81 mg via ORAL
  Filled 2016-04-10 (×6): qty 1

## 2016-04-10 MED ORDER — FUROSEMIDE 10 MG/ML IJ SOLN
80.0000 mg | Freq: Once | INTRAMUSCULAR | Status: AC
  Administered 2016-04-10: 80 mg via INTRAVENOUS
  Filled 2016-04-10: qty 8

## 2016-04-10 MED ORDER — NICOTINE 21 MG/24HR TD PT24
21.0000 mg | MEDICATED_PATCH | Freq: Every day | TRANSDERMAL | Status: DC
  Administered 2016-04-10 – 2016-04-15 (×6): 21 mg via TRANSDERMAL
  Filled 2016-04-10 (×6): qty 1

## 2016-04-10 MED ORDER — ALBUTEROL SULFATE (2.5 MG/3ML) 0.083% IN NEBU: 2.5000 mg | INHALATION_SOLUTION | Freq: Four times a day (QID) | RESPIRATORY_TRACT | Status: DC | PRN

## 2016-04-10 MED ORDER — IPRATROPIUM-ALBUTEROL 0.5-2.5 (3) MG/3ML IN SOLN
3.0000 mL | Freq: Four times a day (QID) | RESPIRATORY_TRACT | Status: DC
  Administered 2016-04-10 – 2016-04-11 (×5): 3 mL via RESPIRATORY_TRACT
  Filled 2016-04-10 (×5): qty 3

## 2016-04-10 MED ORDER — SENNOSIDES-DOCUSATE SODIUM 8.6-50 MG PO TABS
2.0000 | ORAL_TABLET | Freq: Two times a day (BID) | ORAL | Status: DC
  Administered 2016-04-10 – 2016-04-15 (×10): 2 via ORAL
  Filled 2016-04-10 (×10): qty 2

## 2016-04-10 MED ORDER — FLUTICASONE FUROATE-VILANTEROL 100-25 MCG/INH IN AEPB
1.0000 | INHALATION_SPRAY | Freq: Every day | RESPIRATORY_TRACT | Status: DC
  Administered 2016-04-11 – 2016-04-15 (×5): 1 via RESPIRATORY_TRACT
  Filled 2016-04-10: qty 28

## 2016-04-10 NOTE — H&P (Signed)
[facility] Admission History and Physical Service Pager: [phone]  Patient name: Tiffany [name] Medical record number: [id number] Date of birth: 05-20-1960 Age: 56 y.o. Gender: female  Primary Care Provider: Kathrine Cords, MD Consultants: none Code Status: FULL  Chief Complaint: SOB  Assessment and Plan: Tiffany [name] is a 56 y.o. female presenting with shortness of breath and 6 lb weight gain in 2 days. PMH is significant for HFpEF (echo 04/04/16 with EF 60-65% and G2DD, home lasix is 39m BID), obesity, T2DM, HLD, OSA, asthma, substance abuse (tobacco, hx of cocaine), bipolar disorder, COPD (home O2 is 6L), HTN.   Shortness of breath: CHF exacerbation mostly likely as she has had a 6lb weight gain in 2 days, in addition to persistent SOB and worsening lower extremity edema. Last echo 04/04/16 with EF 60-65% and G2DD. Home lasix was 490mBID, but patient was told to increase to 8081mID on 2/2 and never returned to home dose. Recently increased from 80 twice a day to 3 times a day due to persistent shortness of breath. Patient notes that she has not had any more significant urination since that increase in dose. Pneumonia less likely without fevers or productive cough. PE less likely with Wells score 0. COPD exacerbation is also on the differential, given her significant wheezing on exam. She was recently treated for a COPD exacerbation on 3/21 with a 5 day course of Prednisone and Levaquin. ACS is also a consideration, although Pt does not endorse chest pain. -admit to tele under inpatient status, Dr. NeaNori Riistending -CXR to rule out focal consolidation or other acute pulmonary processes -IV lasix 80m4mce, reassess fluid status after this dose -strict I/Os -daily weights -EKG now, then repeat in AM -Trend troponins  COPD: home regimen is Spiriva and breo, and 6L O2 by [place]. Followed by pulm, last visit 12/2015. Treated for possible COPD exacerbation starting  04/05/16, patient completed course of Levaquin and prednisone. She reports using her albuterol this morning with little improvement in her shortness of breath.  -Continue home Spiriva Breo -Scheduled duonebs every 6 hours -albuterol nebs PRN -Will re-assess in the morning after IV Lasix to see if she would benefit from steroids, given her wheezing on exam  Acute on Chronic Kidney Disease: Baseline per chart review appears to be around 1, however Cr has been 1.2-1.5 over the last few months. Recent creatinine as an outpatient of 1.2. Patient reports normal amounts of voids at home. Holding home gabapentin, oxybutynin, and Lisinopril in the setting of possible AKI. -Urinalysis -Monitor creatinine on CMP now and BMP in the morning  Constipation: patient denies stool for several weeks, although unclear historian. Patient would not agree to an enema at present. Abdomen is mildly distended on exam, but without peritoneal signs. -KUB -scheduled miralax bid and senokot-s 2 tablets bid -consider enema vs suppository  T2DM: last HgbA1c 11/2015 5.6. Home med is metformin 500 twice a day. -Check hemoglobin A1c -CBG monitoring daily. Can add SSI if blood sugars are persistently elevated, but do not think we need to be checking CBGs four times per day. -Hold home Neurontin for peripheral neuropathy in the setting of possible AKI  History of drug abuse: patient reports last cocaine use November 2017. -UDS  Bipolar 1 disorder/Schizophernia.  At home on Remeron 30 mg daily, Geodon 160 mg and Lexapro.  Follows with psychiatry. Mood stable on my exam. -Continue Lexapro 20 mg po daily -Continue Geodon 80 mg nightly -Continue home Cogentin  0.5 mg daily  -Continue home [name] 500 mg nightly -Continue home Remeron 30 mg nightly  Urge incontinence: Home med is oxybutynin. Will hold this given up trending creatinine as an outpatient and unclear voiding history. -Hold home oxybutynin -Monitor intake and  output -Urinalysis as above  HTN: Normotensive on admission, patient reports that she took her blood pressure medicines this morning. -Hold home verapamil, patient is on 2 calcium channel blockers in PCP was attempting to stop this -Continue Norvasc. May need to increase this dose since we are stopping Verapamil. -Hold home lisinopril in the setting of possible AKI -Monitor blood pressure  HLD.  Last lipid panel less than average risk 11/2015, continue home statin. -Continue Lipitor 40 mg daily  OSA: States she sometimes uses CPAP at home and sometimes does not. -CPAP while hospitalized  Tobacco use: Patient currently smokes 1.5 packs per day. -Nicotine patch while hospitalized.  FEN/GI: Regular diet per patient preference Prophylaxis: Heparin  Disposition: admit to tele, Dr. [name] Riis attending  History of Present Illness:  Tiffany [name] is a 56 y.o. female presenting with shortness of breath, worsening over the past 2 weeks. Outpatient, she has been recently treated for both COPD exacerbation and CHF exacerbation. Most recently increased from 29m BID to 80 mg TID on 3/23. Patient reports that she has not noticed any increase in urination after this increased dose. She states she has slept on two big pillows for many years and has not had to increase this recently. No PND. She endorses worsening lower extremity edema over the last few weeks as well. She thinks the edema started in her ankles and then traveled up to her abdomen. She also notes a dry cough that has worsened today, has some rhinorrhea as well. Sick contacts include her son who had chills recently. She denies fevers or chills herself. Denies any chest pain, dysuria. Does endorse some constipation feels she has not had a bowel movement in several weeks. Not willing to take an enema at present. For her COPD, she reports that she has used her inhalers as instructed including using albuterol this morning with little improvement in her  shortness of breath.  In clinic today, given recent 6lb weight gain in 2 days and lower extremity edema patient was sent via direct admission.  Review Of Systems: Per HPI with the following additions: denies chest pain, dysuria. Endorses constipation.  ROS  Patient Active Problem List   Diagnosis Date Noted  . Shortness of breath 04/10/2016  . Benign essential HTN 03/22/2016  . Rectal bleeding 12/31/2015  . Cocaine abuse 12/17/2015  . Bipolar affective disorder, mixed, severe, with psychotic behavior ([facility] 11/28/2015  . Cocaine use disorder, severe, dependence ([facility] 11/28/2015  . Hyponatremia 11/25/2015  . AKI (acute kidney injury) ([facility] 11/24/2015  . Blood in sputum   . Chronic diastolic heart failure ([facility]   . SOB (shortness of breath)   . Hypoxia 08/19/2015  . Onychomycosis 01/21/2015  . Nasal congestion 12/17/2014  . Tobacco use disorder 07/22/2014  . COPD (chronic obstructive pulmonary disease) ([facility] 07/08/2014  . Seborrheic keratoses 12/31/2013  . Thrush 09/19/2013  . Knee pain, bilateral 01/22/2013  . Seizure ([facility] 01/04/2013  . Health care maintenance 11/25/2012  . Folliculitis 116/10/9602 . History of kidney stones 06/18/2012  . Chronic pain syndrome 06/18/2012  . Muscle spasm 06/18/2012  . Elevated troponin 04/28/2012  . Asthma with acute exacerbation 04/28/2012  . Allergic reaction 04/07/2012  . Itching 09/06/2011  . HSV infection 08/30/2011  .  Dyslipidemia 04/24/2011  . Anemia 04/24/2011  . Diabetic neuropathy ([place]) 04/24/2011  . Obstructive sleep apnea 10/18/2010  . Asthma 10/18/2010  . Obesity 10/18/2010  . Type 2 diabetes mellitus ([place]) 10/18/2010    Past Medical History: Past Medical History:  Diagnosis Date  . Anxiety   . Arthritis    "all over" (04/10/2016)  . Asthma   . CHF (congestive heart failure) ([place])   . Chronic bronchitis ([place])   . Chronic kidney disease    "I see a kidney dr." (04/10/2016)  . Cocaine abuse   . Complication of  anesthesia    decreased bp, decreased heart rate  . Depression   . Disorder of nervous system   . Emphysema   . GERD (gastroesophageal reflux disease)   . Heart attack 1980s  . History of blood transfusion 1994   "couldn't stop bleeding from my period"  . Hyperlipidemia LDL goal <70   . Hypertension   . Incontinence   . Manic depression ([place])   . On home oxygen therapy    "6L; 24/7" (04/10/2016)  . OSA on CPAP    "wear mask sometimes" (04/10/2016)  . Paranoid ([place])    "sometimes; I'm on RX for it" (04/10/2016)  . Pneumonia    "I've had it several times; haven't had it since 06/2015" (04/10/2016)  . Schizophrenia ([place])   . Seasonal allergies   . Seizures ([place])    "don't know what kind; last one was ~ 1 yr ago" (04/10/2016)  . Sinus trouble   . Stroke [facility]) 1980s   denies residual on 04/10/2016  . Type II diabetes mellitus ([place])     Past Surgical History: Past Surgical History:  Procedure Laterality Date  . CESAREAN SECTION  1997  . HERNIA REPAIR    . UMBILICAL HERNIA REPAIR  ~ 1963   "that's why I don't have a belly button"  . VAGINAL HYSTERECTOMY      Social History: Social History  Substance Use Topics  . Smoking status: Current Every Day Smoker    Packs/day: 1.50    Years: 38.00    Types: Cigarettes    Start date: 01/16/1977  . Smokeless tobacco: Never Used  . Alcohol use No   Additional social history: lives with son.  Please also refer to relevant sections of EMR.  Family History: Family History  Problem Relation Age of Onset  . Cancer Father     prostate  . Cancer Mother     lung  . Depression Mother   . Depression Sister   . Anxiety disorder Sister   . Schizophrenia Sister   . Bipolar disorder Sister   . Depression Sister   . Depression Brother   . Heart failure      cousin    Allergies and Medications: Allergies  Allergen Reactions  . Hydroxyzine Other (See Comments)    unknown  . Codeine Nausea And Vomiting  . Sulfa Antibiotics Itching    No current facility-administered medications on file prior to encounter.    Current Outpatient Prescriptions on File Prior to Encounter  Medication Sig Dispense Refill  . acetaminophen (TYLENOL 8 HOUR) 650 MG CR tablet Take 1 tablet (650 mg total) by mouth every 8 (eight) hours as needed for pain. 60 tablet 0  . albuterol (PROVENTIL HFA;VENTOLIN HFA) 108 (90 Base) MCG/ACT inhaler Inhale 2 puffs into the lungs every 6 (six) hours as needed. 1 Inhaler 6  . albuterol (PROVENTIL) (2.5 MG/3ML) 0.083% nebulizer solution Take 3 mLs (  2.5 mg total) by nebulization every 6 (six) hours as needed for wheezing or shortness of breath. 150 mL 1  . amLODipine (NORVASC) 5 MG tablet TAKE 2 TABLETS EVERY DAY (Patient taking differently: TAKE 50m twice daily) 180 tablet 0  . aspirin 81 MG EC tablet Take 1 tablet (81 mg total) by mouth daily. Swallow whole. 30 tablet 1  . atorvastatin (LIPITOR) 40 MG tablet Take 1 tablet (40 mg total) by mouth daily. 30 tablet 1  . benztropine (COGENTIN) 0.5 MG tablet Take 1 tablet (0.5 mg total) by mouth daily. 30 tablet 2  . BREO ELLIPTA 100-25 MCG/INH AEPB INHALE 1 PUFF EVERY DAY 180 each 0  . divalproex ([name] ER) 500 MG 24 hr tablet Take 1 tablet (500 mg total) by mouth daily. (Patient taking differently: Take 500 mg by mouth at bedtime. ) 30 tablet 2  . escitalopram (LEXAPRO) 20 MG tablet Take 1 tablet (20 mg total) by mouth daily. 30 tablet 2  . furosemide (LASIX) 40 MG tablet Take 40 mg by mouth 2 (two) times daily.    .Marland Kitchengabapentin (NEURONTIN) 600 MG tablet Take 1 tablet (600 mg total) by mouth 3 (three) times daily. 90 tablet 1  . KLOR-CON M20 20 MEQ tablet Take 1 tablet (20 mEq total) by mouth daily. 90 tablet 0  . lisinopril (PRINIVIL,ZESTRIL) 40 MG tablet Take 1 tablet (40 mg total) by mouth daily. 30 tablet 1  . metFORMIN (GLUCOPHAGE-XR) 500 MG 24 hr tablet TAKE 1 TABLET TWICE DAILY 180 tablet 0  . mirtazapine (REMERON) 30 MG tablet Take 1 tablet (30 mg total) by  mouth at bedtime. 30 tablet 2  . montelukast (SINGULAIR) 10 MG tablet Take 1 tablet (10 mg total) by mouth at bedtime. 30 tablet 1  . Multiple Vitamins-Minerals (MULTIVITAMIN WITH MINERALS) tablet Take 1 tablet by mouth daily. Reported on 07/28/2015 30 tablet 1  . omeprazole (PRILOSEC) 40 MG capsule Take 1 capsule (40 mg total) by mouth daily. (Patient taking differently: Take 40 mg by mouth 2 (two) times daily. ) 30 capsule 1  . oxybutynin (DITROPAN) 5 MG tablet Take 1 tablet (5 mg total) by mouth 3 (three) times daily. 90 tablet 1  . tiotropium (SPIRIVA HANDIHALER) 18 MCG inhalation capsule Place 1 capsule (18 mcg total) into inhaler and inhale daily. 90 capsule 1  . verapamil (VERELAN PM) 240 MG 24 hr capsule Take 480 mg by mouth at bedtime.    . ziprasidone (GEODON) 80 MG capsule Take 1 capsule (80 mg total) by mouth at bedtime. 30 capsule 2  . Alcohol Swabs (B-D SINGLE USE SWABS REGULAR) PADS CHECK CAPILLARY BLOOD GLUCOSE ONE TIME DAILY 100 each 3  . Blood Glucose Monitoring Suppl (ACCU-CHEK AVIVA PLUS) w/Device KIT CHECK BLOOD SUGAR THREE TIMES DAILY 1 kit 0  . glucose blood test strip Use to check blood sugar 3 times daily. 100 each 12  . levofloxacin (LEVAQUIN) 500 MG tablet Take 1 tablet (500 mg total) by mouth daily. (Patient not taking: Reported on 04/10/2016) 7 tablet 0  . naproxen (NAPROSYN) 500 MG tablet Take 1 tablet (500 mg total) by mouth 3 (three) times daily as needed for mild pain. (Patient not taking: Reported on 04/10/2016) 90 tablet 1  . predniSONE (DELTASONE) 50 MG tablet Take 1 tablet (50 mg total) by mouth daily with breakfast. (Patient not taking: Reported on 04/10/2016) 5 tablet 0    Objective: BP 114/65   Pulse 81   Temp 98.9 F (37.2 C) (Oral)  Resp (!) 22   SpO2 100%  Exam: General: Obese female resting in chair in NAD.  Eyes: EOMI, PEERLA ENTM: MMM, oropharnyx normal, [place] in place Neck: supple, no JVD  Cardiovascular: RRR, no murmur Respiratory: diffuse  rhonchi, mild bibasilar crackles, easy WOB, referred upper airway wheezing Gastrointestinal: Soft, non-tender, +BS, mildly distended, no rebound, no guarding MSK: moves all extremities, WWP, 2+ edema to bilateral knees Derm: no rashes or wounds on exposed skin Neuro: CN II-XII grossly intact, AO x4 Psych: mood and affect appropriate  Labs and Imaging: CBC BMET  No results for input(s): WBC, HGB, HCT, PLT in the last 168 hours.  Recent Labs Lab 04/05/16 1227  NA 145*  K 3.6  CL 98  CO2 33*  BUN 14  CREATININE 1.20*  GLUCOSE 84  CALCIUM 9.9      Sela Hilding, MD 04/10/2016, 5:06 PM PGY-1, [place] Intern pager: [phone], text pages welcome  FPTS Upper-Level Resident Addendum  I have independently interviewed and examined the patient. I have discussed the above with the original author and agree with their documentation. My edits for correction/addition/clarification are in blue. Please see also any attending notes.   Hyman Bible, MD PGY-2, [place] Service pager: [phone] (text pages welcome through [place])

## 2016-04-10 NOTE — Progress Notes (Signed)
    Subjective:  Tiffany [name] is a 56 y.o. female who presents to the [facility] today with a chief complaint of dyspnea.   HPI:   Dyspnea Symptoms started about a week an a half ago. She has a history of COPD and G2DD. She was seen in clinic 5 days ago and diagnosed with a COPD exacerbation and started on prednisone and levaquin. She followed up in clinic 2 days later and appeared more volume overloaded. Admission was discussed at that time, however patient deferred. Patient was sent home over the weekend with an increased dose of her lasix from 40mg  bid to 80mg  tid. She presented for follow up today.  She thinks that she is about the same. [name] currently on her baseline 6L at home and her sats have been maintained in the low 90s. Over the past days she has developed a worsened cough. No fevers. No chest pain. She has some swelling in her legs, but is not sure if it is worse or not. Patient endorses good compliance to her increased dose of lasix, but is not sure if it has actually been making her urinate or not. She feels more bloated in her abdomen and chest. Her weight is up 6 pounds over the last 3 days.   ROS: Per HPI  PMH: Smoking history reviewed.   Objective:  Physical Exam: BP 110/68   Pulse 82   Temp 98 F (36.7 C) (Oral)   Ht 5\' 4"  (1.626 m)   Wt 286 lb 9.6 oz (130 kg)   SpO2 93%   BMI 49.19 kg/m   Gen: NAD, resting comfortably, nasal canula in place, speaking in full sentences CV: RRR with no murmurs appreciated Pulm: Mildly increased work of breathing. Diffuse wheezes noted with bibasilar crackles. GI: Morbidly obese. Normal bowel sounds present. Soft, Nontender, Nondistended. MSK: 2+ pretibial edema to knees bilaterally.  Skin: warm, dry Neuro: grossly normal, moves all extremities Psych: Normal affect and thought content  Assessment/Plan:  Shortness of Breath Concern for volume overload given her 6 pound weight gain over the past 2 days, LE edema, and questionable  response to lasix. She was able to ambulat around clinic on her home 6L o2 and maintain sats >90%.Recommended to patient that she be admitted for diuresis and to improve her respiratory status. She was very reluctant to this at first. Discussed with Dr Mingo Amber who then had a lengthy discussion with patient regarding risks of going home vs benefits of being admitted to the hospital. Patient finally agreed to be admitted, however stated that she would first need to drop her car off with her son. Patient will be admitted to the FMTS this afternoon. Recommend checking basic labs including renal function (had an increased creatinine over the past few weeks) and starting diuresis with IV lasix. She recently had her echocardiogram last week - no need to repeat during this hospitalization.   Algis Greenhouse. Jerline Pain, [place] [facility] Resident PGY-3 04/10/2016 12:04 PM

## 2016-04-10 NOTE — Progress Notes (Signed)
Tiffany [name] is a 56 y.o. female patient admitted from ED awake, alert - oriented  X 4 - no acute distress noted.  VSS - Blood pressure 114/65, pulse 81, temperature 98.9 F (37.2 C), temperature source Oral, resp. rate (!) 22, SpO2 100 %.    IV in place, occlusive dsg intact without redness.  Orientation to room, and floor completed with information packet given to patient/family.  Patient declined safety video at this time.  Admission INP armband ID verified with patient/family, and in place.   SR up x 2, fall assessment complete, with patient and family able to verbalize understanding of risk associated with falls, and verbalized understanding to call nsg before up out of bed.  Call light within reach, patient able to voice, and demonstrate understanding.  Skin, clean-dry- intact without evidence of bruising, or skin tears.   No evidence of skin break down noted on exam.     Will cont to eval and treat per MD orders.  Luci Bank, RN 04/10/2016 2:35 PM

## 2016-04-10 NOTE — Patient Instructions (Signed)
We will call you when your bed is ready.  It is very important that we get this fluid off of you.  Take care,  Dr Jerline Pain

## 2016-04-10 NOTE — Progress Notes (Signed)
CRITICAL VALUE ALERT  Critical value received:  Trop 0.03  Date of notification:  04/10/16  Time of notification:  2055  Critical value read back:yes  Nurse who received alert:  Arthor Captain LPN  MD notified (1st page): Family Medicine  Time of first page:  2057  MD notified (2nd page):  Time of second page:  Responding MD:  Family medicine  Time MD responded:  2100

## 2016-04-11 ENCOUNTER — Telehealth: Payer: Self-pay | Admitting: Family Medicine

## 2016-04-11 DIAGNOSIS — J449 Chronic obstructive pulmonary disease, unspecified: Secondary | ICD-10-CM

## 2016-04-11 DIAGNOSIS — I5030 Unspecified diastolic (congestive) heart failure: Secondary | ICD-10-CM

## 2016-04-11 DIAGNOSIS — K5900 Constipation, unspecified: Secondary | ICD-10-CM

## 2016-04-11 DIAGNOSIS — I509 Heart failure, unspecified: Secondary | ICD-10-CM

## 2016-04-11 DIAGNOSIS — R0602 Shortness of breath: Secondary | ICD-10-CM

## 2016-04-11 DIAGNOSIS — J441 Chronic obstructive pulmonary disease with (acute) exacerbation: Secondary | ICD-10-CM

## 2016-04-11 DIAGNOSIS — I2721 Secondary pulmonary arterial hypertension: Secondary | ICD-10-CM

## 2016-04-11 LAB — BASIC METABOLIC PANEL
Anion gap: 9 (ref 5–15)
BUN: 27 mg/dL — ABNORMAL HIGH (ref 6–20)
CO2: 39 mmol/L — ABNORMAL HIGH (ref 22–32)
Calcium: 8.9 mg/dL (ref 8.9–10.3)
Chloride: 96 mmol/L — ABNORMAL LOW (ref 101–111)
Creatinine, Ser: 1.33 mg/dL — ABNORMAL HIGH (ref 0.44–1.00)
GFR calc Af Amer: 51 mL/min — ABNORMAL LOW (ref >60)
GFR calc non Af Amer: 44 mL/min — ABNORMAL LOW (ref >60)
Glucose, Bld: 88 mg/dL (ref 65–99)
Potassium: 3.8 mmol/L (ref 3.5–5.1)
Sodium: 144 mmol/L (ref 135–145)

## 2016-04-11 LAB — CBC
HCT: 36.7 % (ref 36.0–46.0)
Hemoglobin: 10.8 g/dL — ABNORMAL LOW (ref 12.0–15.0)
MCH: 27.6 pg (ref 26.0–34.0)
MCHC: 29.4 g/dL — ABNORMAL LOW (ref 30.0–36.0)
MCV: 93.9 fL (ref 78.0–100.0)
Platelets: 403 10*3/uL — ABNORMAL HIGH (ref 150–400)
RBC: 3.91 MIL/uL (ref 3.87–5.11)
RDW: 16.5 % — ABNORMAL HIGH (ref 11.5–15.5)
WBC: 10.6 10*3/uL — ABNORMAL HIGH (ref 4.0–10.5)

## 2016-04-11 LAB — HEMOGLOBIN A1C
Hgb A1c MFr Bld: 5.8 % — ABNORMAL HIGH (ref 4.8–5.6)
Mean Plasma Glucose: 120 mg/dL

## 2016-04-11 LAB — GLUCOSE, CAPILLARY: Glucose-Capillary: 87 mg/dL (ref 65–99)

## 2016-04-11 LAB — TROPONIN I
Troponin I: <0.03 ng/mL (ref <0.03)
Troponin I: <0.03 ng/mL (ref <0.03)

## 2016-04-11 MED ORDER — ORAL CARE MOUTH RINSE
15.0000 mL | Freq: Two times a day (BID) | OROMUCOSAL | Status: DC
  Administered 2016-04-11 – 2016-04-15 (×5): 15 mL via OROMUCOSAL

## 2016-04-11 MED ORDER — GABAPENTIN 600 MG PO TABS
600.0000 mg | ORAL_TABLET | Freq: Three times a day (TID) | ORAL | Status: DC
  Administered 2016-04-11 – 2016-04-15 (×12): 600 mg via ORAL
  Filled 2016-04-11 (×12): qty 1

## 2016-04-11 MED ORDER — ACETAMINOPHEN 500 MG PO TABS
1000.0000 mg | ORAL_TABLET | Freq: Once | ORAL | Status: AC
Start: 2016-04-11 — End: 2016-04-11
  Administered 2016-04-11: 1000 mg via ORAL
  Filled 2016-04-11: qty 2

## 2016-04-11 MED ORDER — ACETAMINOPHEN 325 MG PO TABS
650.0000 mg | ORAL_TABLET | Freq: Three times a day (TID) | ORAL | Status: DC | PRN
  Administered 2016-04-11 – 2016-04-15 (×5): 650 mg via ORAL
  Filled 2016-04-11 (×4): qty 2

## 2016-04-11 MED ORDER — FUROSEMIDE 10 MG/ML IJ SOLN
80.0000 mg | Freq: Two times a day (BID) | INTRAMUSCULAR | Status: DC
  Administered 2016-04-12 – 2016-04-13 (×3): 80 mg via INTRAVENOUS
  Filled 2016-04-11 (×3): qty 8

## 2016-04-11 MED ORDER — IPRATROPIUM-ALBUTEROL 0.5-2.5 (3) MG/3ML IN SOLN
3.0000 mL | RESPIRATORY_TRACT | Status: DC
  Administered 2016-04-12 (×3): 3 mL via RESPIRATORY_TRACT
  Filled 2016-04-11 (×4): qty 3

## 2016-04-11 MED ORDER — POTASSIUM CHLORIDE CRYS ER 20 MEQ PO TBCR
40.0000 meq | EXTENDED_RELEASE_TABLET | Freq: Every day | ORAL | Status: DC
  Administered 2016-04-11 – 2016-04-15 (×5): 40 meq via ORAL
  Filled 2016-04-11 (×5): qty 2

## 2016-04-11 MED ORDER — CHLORHEXIDINE GLUCONATE 0.12 % MT SOLN
15.0000 mL | Freq: Two times a day (BID) | OROMUCOSAL | Status: DC
  Administered 2016-04-11 – 2016-04-15 (×9): 15 mL via OROMUCOSAL
  Filled 2016-04-11 (×9): qty 15

## 2016-04-11 MED ORDER — FUROSEMIDE 10 MG/ML IJ SOLN
80.0000 mg | Freq: Once | INTRAMUSCULAR | Status: AC
  Administered 2016-04-11: 80 mg via INTRAVENOUS
  Filled 2016-04-11: qty 8

## 2016-04-11 MED ORDER — LISINOPRIL 40 MG PO TABS
40.0000 mg | ORAL_TABLET | Freq: Every day | ORAL | Status: DC
  Administered 2016-04-11 – 2016-04-12 (×2): 40 mg via ORAL
  Filled 2016-04-11 (×2): qty 1

## 2016-04-11 MED ORDER — FUROSEMIDE 10 MG/ML IJ SOLN
120.0000 mg | Freq: Once | INTRAMUSCULAR | Status: AC
  Administered 2016-04-11: 120 mg via INTRAVENOUS
  Filled 2016-04-11: qty 12

## 2016-04-11 NOTE — Consult Note (Signed)
   [facility] Inpatient Consult   04/11/2016  MEHEK GREGA 28-Mar-1960 [id number]  Patient assessed for restart of services.  Patient with 3 admissions. HX with [place] and Pharmacist  Patient is a 56 yo F with longstanding pulmonary and cardiac issues including HFpEF, OSA, COPD on 6L home O2, HTN, DM2 who presents with about a weeks worth of increasing dyspnea.  She has been seen in clinic multiple times in the past week for this dyspnea, and recommended admission multiple times as well.  Has had slow increase of weights over that time, as well as increasing orthopnea.  Now up 6 lbs in 2 days.  Has been treated outpt with course of prednisone and Levaquin.  No chest pain.  Dry cough. This notes are from H&P per MD. Met with the patient at the bedside to follow up for post hospital transition of care since this is her 3rd admission in the past 6 months.  Patient states she would like the follow up. She endorses she continues to have [employer]. [name] having some difficulty affording her medications at time.  She endorses [facility] as her primary care provider with Dr. Kathrine Cords. [facility] Care Management will have community follow up.  For questions, please contact:  Natividad Brood, RN BSN [facility] Liaison  [phone] business mobile phone Toll free office [phone]

## 2016-04-11 NOTE — Consult Note (Signed)
Name: Tiffany [name] MRN: [id number] DOB: Feb 06, 1960    ADMISSION DATE:  04/10/2016 CONSULTATION DATE:  04/11/16  REFERRING MD :  [name] Riis  CHIEF COMPLAINT:  SOB   HISTORY OF PRESENT ILLNESS:  Tiffany [name] is a 55 y.o. female with a PMH as outlined below including O2 dependent COPD with continued tobacco use and hx cocaine use and OSA with occasional CPAP use (followed by Dr. Lamonte Sakai, last seen Dec 2017). She had been seen in IMTS clinic multiple times over a week long period for dyspnea and had recommendation to be admitted but she refused.  On 3/26, she was finally admitted for [facility] exacerbation (6lb weight gain in 2 days along with increase in orthopnea and LE edema).  She was diuresed and continued on preadmission BD's.  As outpatient, she is on 66m Lasix BID but reportedly this was recently increased to 832mBID and she has continued at this dose  Echo from 04/04/16 with EF 60-65%, G2DD, PAP 49.  PAST MEDICAL HISTORY :   has a past medical history of Anxiety; Arthritis; Asthma; CHF (congestive heart failure) (HCLeal Chronic bronchitis (HCSt. Peters Chronic kidney disease; Cocaine abuse; Complication of anesthesia; Depression; Disorder of nervous system; Emphysema; GERD (gastroesophageal reflux disease); Heart attack (1980s); History of blood transfusion (1994); Hyperlipidemia LDL goal <70; Hypertension; Incontinence; Manic depression (HCOgden On home oxygen therapy; OSA on CPAP; Paranoid (HCRoth[place] Pneumonia; Schizophrenia (HCLynd Seasonal allergies; Seizures (HCShorts[place] Sinus trouble; Stroke ([facility](1980s); and Type II diabetes mellitus (HCMead  has a past surgical history that includes Cesarean section (1997); Hernia repair; Umbilical hernia repair (~ 1963); and Vaginal hysterectomy. Prior to Admission medications   Medication Sig Start Date End Date Taking? Authorizing Provider  acetaminophen (TYLENOL 8 HOUR) 650 MG CR tablet Take 1 tablet (650 mg total) by mouth every 8 (eight) hours as needed for pain. 04/05/16   Yes CrArchie PattenMD  albuterol (PROVENTIL HFA;VENTOLIN HFA) 108 (90 Base) MCG/ACT inhaler Inhale 2 puffs into the lungs every 6 (six) hours as needed. 12/05/15  Yes Jolanta B Pucilowska, MD  albuterol (PROVENTIL) (2.5 MG/3ML) 0.083% nebulizer solution Take 3 mLs (2.5 mg total) by nebulization every 6 (six) hours as needed for wheezing or shortness of breath. 02/18/16  Yes [place], DO  amLODipine (NORVASC) 5 MG tablet TAKE 2 TABLETS EVERY DAY Patient taking differently: TAKE 31m61mwice daily 04/05/16  Yes CryArchie PattenD  aspirin 81 MG EC tablet Take 1 tablet (81 mg total) by mouth daily. Swallow whole. 12/05/15  Yes Jolanta B Pucilowska, MD  atorvastatin (LIPITOR) 40 MG tablet Take 1 tablet (40 mg total) by mouth daily. 12/05/15  Yes Jolanta B Pucilowska, MD  benztropine (COGENTIN) 0.5 MG tablet Take 1 tablet (0.5 mg total) by mouth daily. 03/31/16 03/31/17 Yes SyeKathlee NationsD  BREO ELLIPTA 100-25 MCG/INH AEPB INHALE 1 PUFF EVERY DAY 02/15/16  Yes KatSela HuaD  divalproex ([name] ER) 500 MG 24 hr tablet Take 1 tablet (500 mg total) by mouth daily. Patient taking differently: Take 500 mg by mouth at bedtime.  03/31/16 03/31/17 Yes SyeKathlee NationsD  escitalopram (LEXAPRO) 20 MG tablet Take 1 tablet (20 mg total) by mouth daily. 03/31/16  Yes SyeKathlee NationsD  furosemide (LASIX) 40 MG tablet Take 40 mg by mouth 2 (two) times daily.   Yes Historical Provider, MD  gabapentin (NEURONTIN) 600 MG tablet Take 1 tablet (600 mg total) by mouth 3 (three) times daily. 12/05/15  Yes  Clovis Fredrickson, MD  KLOR-CON M20 20 MEQ tablet Take 1 tablet (20 mEq total) by mouth daily. 03/17/16  Yes Sueanne Margarita, MD  lisinopril (PRINIVIL,ZESTRIL) 40 MG tablet Take 1 tablet (40 mg total) by mouth daily. 02/07/16  Yes Archie Patten, MD  metFORMIN (GLUCOPHAGE-XR) 500 MG 24 hr tablet TAKE 1 TABLET TWICE DAILY 03/28/16  Yes Archie Patten, MD  mirtazapine (REMERON) 30 MG tablet Take 1 tablet (30 mg  total) by mouth at bedtime. 03/31/16  Yes Kathlee Nations, MD  montelukast (SINGULAIR) 10 MG tablet Take 1 tablet (10 mg total) by mouth at bedtime. 12/05/15  Yes Jolanta B Pucilowska, MD  Multiple Vitamins-Minerals (MULTIVITAMIN WITH MINERALS) tablet Take 1 tablet by mouth daily. Reported on 07/28/2015 12/05/15  Yes Jolanta B Pucilowska, MD  omeprazole (PRILOSEC) 40 MG capsule Take 1 capsule (40 mg total) by mouth daily. Patient taking differently: Take 40 mg by mouth 2 (two) times daily.  12/05/15  Yes Jolanta B Pucilowska, MD  oxybutynin (DITROPAN) 5 MG tablet Take 1 tablet (5 mg total) by mouth 3 (three) times daily. 12/05/15  Yes Jolanta B Pucilowska, MD  tiotropium (SPIRIVA HANDIHALER) 18 MCG inhalation capsule Place 1 capsule (18 mcg total) into inhaler and inhale daily. 12/05/15  Yes Jolanta B Pucilowska, MD  verapamil (VERELAN PM) 240 MG 24 hr capsule Take 480 mg by mouth at bedtime.   Yes Historical Provider, MD  ziprasidone (GEODON) 80 MG capsule Take 1 capsule (80 mg total) by mouth at bedtime. 03/31/16  Yes Kathlee Nations, MD  Alcohol Swabs (B-D SINGLE USE SWABS REGULAR) PADS CHECK CAPILLARY BLOOD GLUCOSE ONE TIME DAILY 02/03/16   Archie Patten, MD  Blood Glucose Monitoring Suppl (ACCU-CHEK AVIVA PLUS) w/Device KIT CHECK BLOOD SUGAR THREE TIMES DAILY 12/02/15   Archie Patten, MD  glucose blood test strip Use to check blood sugar 3 times daily. 12/05/15   Clovis Fredrickson, MD  levofloxacin (LEVAQUIN) 500 MG tablet Take 1 tablet (500 mg total) by mouth daily. Patient not taking: Reported on 04/10/2016 04/05/16   Archie Patten, MD  naproxen (NAPROSYN) 500 MG tablet Take 1 tablet (500 mg total) by mouth 3 (three) times daily as needed for mild pain. Patient not taking: Reported on 04/10/2016 12/05/15   Clovis Fredrickson, MD  predniSONE (DELTASONE) 50 MG tablet Take 1 tablet (50 mg total) by mouth daily with breakfast. Patient not taking: Reported on 04/10/2016 04/05/16   Archie Patten, MD   Allergies  Allergen Reactions  . Hydroxyzine Other (See Comments)    unknown  . Codeine Nausea And Vomiting  . Sulfa Antibiotics Itching    FAMILY HISTORY:  family history includes Anxiety disorder in her sister; Bipolar disorder in her sister; Cancer in her father and mother; Depression in her brother, mother, sister, and sister; Schizophrenia in her sister. SOCIAL HISTORY:  reports that she has been smoking Cigarettes.  She started smoking about 39 years ago. She has a 57.00 pack-year smoking history. She has never used smokeless tobacco. She reports that she uses drugs, including Cocaine. She reports that she does not drink alcohol.  REVIEW OF SYSTEMS:   All negative; except for those that are bolded, which indicate positives.  Constitutional: weight loss, weight gain, night sweats, fevers, chills, fatigue, weakness.  HEENT: headaches, sore throat, sneezing, nasal congestion, post nasal drip, difficulty swallowing, tooth/dental problems, visual complaints, visual changes, ear aches. Neuro: difficulty with speech, weakness, numbness, ataxia. CV:  chest pain, orthopnea, PND, swelling in lower extremities, dizziness, palpitations, syncope.  Resp: cough, hemoptysis, dyspnea, wheezing. GI: heartburn, indigestion, abdominal pain, nausea, vomiting, diarrhea, constipation, change in bowel habits, loss of appetite, hematemesis, melena, hematochezia.  GU: dysuria, change in color of urine, urgency or frequency, flank pain, hematuria. MSK: joint pain or swelling, decreased range of motion. Psych: change in mood or affect, depression, anxiety, suicidal ideations, homicidal ideations. Skin: rash, itching, bruising.   SUBJECTIVE:  Still feels SOB mainly with exertion.  Has not had cough.  Has had mild wheeze. When asked how much she still smokes she says "I don't smoke no more, I'm in the hospital"!  Says that she is making urine; however, she has not had much UOP.since admit.  No  I/O's documented.  VITAL SIGNS: Temp:  [98 F (36.7 C)-98.9 F (37.2 C)] 98 F (36.7 C) (03/27 0539) Pulse Rate:  [76-81] 77 (03/27 0539) Resp:  [20-22] 20 (03/27 0539) BP: (114-125)/(65-67) 125/66 (03/27 0539) SpO2:  [94 %-100 %] 98 % (03/27 0539) Weight:  [127 kg (279 lb 14.4 oz)] 127 kg (279 lb 14.4 oz) (03/27 0539)  PHYSICAL EXAMINATION: General: Obese female, in NAD. Neuro: A&O x 3, no deficits. HEENT: [place] / AT.  MMM. 6L O2 via [place] in place. Cardiovascular: RRR, no M/R/G. Lungs: Normal respiratory effort.  Faint wheeze, ? Cardiac wheeze. Abdomen: Obese.  BS hypoactive.  S/NT/ND. Musculoskeletal: No deformities.  1+ edema. Skin: Warm, dry.    Recent Labs Lab 04/05/16 1227 04/10/16 1650 04/11/16 0655  NA 145* 145 144  K 3.6 3.8 3.8  CL 98 96* 96*  CO2 33* 38* 39*  BUN 14 24* 27*  CREATININE 1.20* 1.44* 1.33*  GLUCOSE 84 90 88    Recent Labs Lab 04/10/16 1650 04/11/16 0655  HGB 11.1* 10.8*  HCT 37.0 36.7  WBC 14.6* 10.6*  PLT 416* 403*   X-ray Chest Pa And Lateral  Result Date: 04/10/2016 CLINICAL DATA:  Chronic shortness of Breath EXAM: CHEST  2 VIEW COMPARISON:  April 05, 2016 FINDINGS: There is no edema or consolidation. Heart is borderline enlarged with pulmonary vascularity within normal limits. No adenopathy. There is degenerative change in thoracic spine. IMPRESSION: Borderline cardiac enlargement.  No edema or consolidation. Electronically Signed   By: Lowella Grip III M.D.   On: 04/10/2016 17:28   Abd 1 View (kub)  Result Date: 04/10/2016 CLINICAL DATA:  Constipation for 1 month. EXAM: ABDOMEN - 1 VIEW COMPARISON:  None FINDINGS: There is a marked stool burden identified throughout the colon. No dilated loops of small bowel identified. No abnormal abdominal or pelvic calcifications. IMPRESSION: 1. Marked stool burden identified within the colon compatible with the clinical history of constipation. Electronically Signed   By: Kerby Moors M.D.    On: 04/10/2016 17:28    STUDIES:  CXR 3/26 > cardiac enlargmenent.  Mild edema.  SIGNIFICANT EVENTS  3/26 > admit. 3/27 > PCCM consulted.  ASSESSMENT / PLAN:  Acute on chronic hypoxic and hypercarbic respiratory failure - due to COPD, OSA, obesity. COPD without exacerbation - suspect that wheeze on exam primarily cardiac wheeze vs true / strict bronchospasm (though may have some component of this). OSA - with occasional CPAP use (no pattern, very random). Tobacco dependence - had discussions with Dr. Lamonte Sakai Dec 2017 regarding quitting and had shown interest at the time; however, still continues to smoke today. Plan: Continue supplemental O2 as needed to maintain SpO2 > 92%. Would not treat with NIMV unless  mental status changes (can assess ABG at that point). Continue BD's. Defer steroids for now. Nocturnal CPAP. Continue aggressive diuresis (started 52m BID). Tobacco cessation counseling. F/u with pulmonology as outpatient after discharge.  dCHF with acute exacerbation - 6lb weight gain in 2 days along with LE edema and orthopnea. Plan: Continue aggressive diuresis as able (started 862mBID - adjust based on BP, SCr, I/O's, weights). Strict I / O's - need negative balance.  Substance abuse - UDS on admit positive for opiates; no opiates listed on outpatient med list and upon review of MAR, no opiates given on admission. Plan: Substance abuse counseling.  Rest per primary team.   RaMontey HoraPASpring Ridge C [facility] Pager: ([phone] ([phone]/27/2018, 11:50 AM  Attending Note:  I have examined patient, reviewed labs, studies and notes. I have discussed the case with R Junius Roadsand I agree with the data and plans as amended above.   [age]ear old woman known to me from the office. She has a history of obesity, obstructive sleep apnea with only marginal CPAP compliance, presumed COPD, hypertension with documented diastolic  dysfunction and she has secondary pulmonary hypertension in the setting of all of these things including substance abuse (abstinent from cocaine since November). She continues to smoke cigarettes, has not had pulmonary function testing as we had planned. On my evaluation she is an obese woman sitting up in a chair wearing oxygen eating her lunch. Her lungs are distant, she has some end-expiratory wheeze. Abdomen is obese. Heart regular without a murmur again distant. 1-2+ pretibial edema.   She has multifactorial secondary pulmonary hypertension. This is almost certainly related to her obesity/OSA, COPD which I believe is significant, hypertension with diastolic dysfunction. Could consider autoimmune disease although there have been no symptoms to support it. In absence of such I would continue to concentrate on appliance with CPAP, good compliance with oxygen and her bronchodilators, tight blood pressure control and optimal volume status. I don't believe she needs any targeted pulmonary hypertension medication at this time. If her autoimmune evaluation shows any evidence of a connective tissue disease that could contribute to pulmonary hypertension then there may be a role for these medications.  RoBaltazar ApoMD, PhD 04/11/2016, 1:45 PM [place] [phone] if no answer [phone]

## 2016-04-11 NOTE — Progress Notes (Signed)
Family Medicine Teaching Service Daily Progress Note Intern Pager: [phone]  Patient name: Tiffany [name] Medical record number: [id number] Date of birth: [birth year] Age: 56 y.o. Gender: female  Primary Care Provider: Kathrine Cords, MD Consultants: none Code Status: FULL  Pt Overview and Major Events to Date:  3/26 admitted with SOB, likely CHF exacerbation  Assessment and Plan: Tiffany [name] is a 56 y.o. female presenting with shortness of breath and 6 lb weight gain in 2 days. PMH is significant for HFpEF (echo 04/04/16 with EF 60-65% and G2DD, home lasix is 80mg  BID), obesity, T2DM, HLD, OSA, asthma, substance abuse (tobacco, hx of cocaine), bipolar disorder, COPD (home O2 is 6L), HTN.   Shortness of breath: CHF exacerbation mostly likely as she has had a 6lb weight gain in 2 days, in addition to persistent SOB and worsening lower extremity edema. COPD exacerbation is also on the differential, given her significant wheezing on exam. She was recently treated for a COPD exacerbation on 3/21 with a 5 day course of Prednisone and Levaquin. Trop neg x 3, EKG with no ST elevation, age undetermined inferior infarct. CXR with borderline cardiac enlargement, no edema. -IV lasix 120mg  once, reassess UOP  -strict I/Os -daily weights -EKG now, then repeat in AM -Trend troponins  COPD: home regimen is Spiriva and breo, and 6L O2 by [place]. Followed by pulm, last visit 12/2015. Treated for possible COPD exacerbation starting 04/05/16, patient completed course of Levaquin and prednisone. She reports using her albuterol this morning with little improvement in her shortness of breath.  -Continue home Spiriva Breo -Scheduled duonebs every 6 hours -albuterol nebs PRN -Will re-assess in the morning after IV Lasix to see if she would benefit from steroids, given her wheezing on exam  Acute on Chronic Kidney Disease: Baseline per chart review appears to be around 1, however Cr has been 1.2-1.5 over the  last few months. Recent creatinine as an outpatient of 1.2. Patient reports normal amounts of voids at home. Holding home gabapentin, oxybutynin, and Lisinopril in the setting of possible AKI. -daily BMP -PVR -if no significant UOP, consider renal US   Constipation: patient denies stool for several weeks, although unclear historian. Patient would not agree to an enema at present. Abdomen is mildly distended on exam, but without peritoneal signs. KUB with market stool burden. -scheduled miralax bid and senokot-s 2 tablets bid -soaps suds enema  T2DM: last HgbA1c 11/2015 5.6. Home med is metformin 500 twice a day. -Check hemoglobin A1c -CBG monitoring daily. Can add SSI if blood sugars are persistently elevated, but do not think we need to be checking CBGs four times per day. -Hold home Neurontin for peripheral neuropathy in the setting of possible AKI  History of drug abuse: patient reports last cocaine use November 2017. UDS with narcotics, patient denies use and none rx'd in database. Likely contributing to constipation as above. -monitor for withdrawal symptoms  Bipolar 1 disorder/Schizophernia. At home on Remeron 30 mg daily, Geodon 160 mgand Lexapro. Follows with psychiatry. Mood stable on my exam. -Continue Lexapro 20 mg po daily -Continue Geodon 80 mg nightly -Continue home Cogentin 0.5 mg daily  -Continue home [name] 500 mg nightly -Continue home Remeron 30 mg nightly  Urge incontinence: Home med is oxybutynin. Will hold this given up trending creatinine as an outpatient and unclear voiding history. -Hold home oxybutynin -Monitor intake and output  HTN: Normotensive, continue current regimen. -Hold home verapamil, patient is on 2 calcium channel blockers in PCP was attempting  to stop this -Continue Norvasc. May need to increase this dose since we are stopping Verapamil. -Hold home lisinopril in the setting of possible AKI -Monitor blood pressure  HLD. Last lipid  panel less than average risk 11/2015, continue home statin. -Continue Lipitor 40 mg daily  OSA: States she sometimes uses CPAP at home and sometimes does not. -CPAP while hospitalized  Tobacco use: Patient currently smokes 1.5 packs per day. -Nicotine patch while hospitalized.  FEN/GI: Regular diet per patient preference Prophylaxis: Heparin  Disposition: continued inpatient management of SOB  Subjective:  Patient feels less SOB today, but denies signficant UOP overnight. Patient feels her swelling is somewhat improved. No BM, will allow me to order enema today. Patient denies narcotic use at all, suggests that she has recently been taking a lot of naproxen.   Objective: Temp:  [98 F (36.7 C)-98.9 F (37.2 C)] 98 F (36.7 C) (03/27 0539) Pulse Rate:  [76-82] 77 (03/27 0539) Resp:  [20-22] 20 (03/27 0539) BP: (110-125)/(65-68) 125/66 (03/27 0539) SpO2:  [93 %-100 %] 98 % (03/27 0539) Weight:  [279 lb 14.4 oz (127 kg)-286 lb 9.6 oz (130 kg)] 279 lb 14.4 oz (127 kg) (03/27 0539) Physical Exam: General: Obese female resting in chair in NAD. Cardiovascular: RRR, no murmur Respiratory: diffuse wheezing, mild bibasilar crackles, easy WOB on home 6L [place] Abdomen: soft, distended, nontender, +BS Extremities: 1+ pitting edema to knees bilaterally, WWP  Laboratory:  Recent Labs Lab 04/10/16 1650 04/11/16 0655  WBC 14.6* 10.6*  HGB 11.1* 10.8*  HCT 37.0 36.7  PLT 416* 403*    Recent Labs Lab 04/05/16 1227 04/10/16 1650  NA 145* 145  K 3.6 3.8  CL 98 96*  CO2 33* 38*  BUN 14 24*  CREATININE 1.20* 1.44*  CALCIUM 9.9 10.0  PROT  --  6.1*  BILITOT  --  0.4  ALKPHOS  --  95  ALT  --  18  AST  --  20  GLUCOSE 84 90   UDS + opiates Trop 0.03, <0.03, <0.03  Imaging/Diagnostic Tests: X-ray Chest Pa And Lateral  Result Date: 04/10/2016 CLINICAL DATA:  Chronic shortness of Breath EXAM: CHEST  2 VIEW COMPARISON:  April 05, 2016 FINDINGS: There is no edema or  consolidation. Heart is borderline enlarged with pulmonary vascularity within normal limits. No adenopathy. There is degenerative change in thoracic spine. IMPRESSION: Borderline cardiac enlargement.  No edema or consolidation. Electronically Signed   By: Lowella Grip III M.D.   On: 04/10/2016 17:28   Abd 1 View (kub)  Result Date: 04/10/2016 CLINICAL DATA:  Constipation for 1 month. EXAM: ABDOMEN - 1 VIEW COMPARISON:  None FINDINGS: There is a marked stool burden identified throughout the colon. No dilated loops of small bowel identified. No abnormal abdominal or pelvic calcifications. IMPRESSION: 1. Marked stool burden identified within the colon compatible with the clinical history of constipation. Electronically Signed   By: Kerby Moors M.D.   On: 04/10/2016 17:28    Sela Hilding, MD 04/11/2016, 8:24 AM PGY-1, [place] Intern pager: [phone], text pages welcome

## 2016-04-11 NOTE — Discharge Summary (Signed)
[facility] Discharge Summary  Patient name: Tiffany [name] Medical record number: [id number] Date of birth: 1960-01-29 Age: 56 y.o. Gender: female Date of Admission: 04/10/2016  Date of Discharge: 04/15/2016 Admitting Physician: Dickie La, MD  Primary Care Provider: Kathrine Cords, MD Consultants: pulmonology  Indication for Hospitalization: SOB  Discharge Diagnoses/Problem List:  COPD on 6L [place] HFpEF Tobacco abuse Substance abuse (hx of cocaine, present opioids)  Disposition: home  Discharge Condition: stable  Discharge Exam: see progress note from day of discharge   Brief Hospital Course:  Patient presented with SOB and weight gain over the two days prior to admission. Shortness of breath thought to be secondary to CHF exacerbation vs worsening of end stage COPD. Pneumonia less likely without fevers or productive cough. PE less likely with Wells score 0. ACS rule out completed and negative. No significant diuresis achieved with '80mg'$  IV lasix, transitioned to '160mg'$  IV lasix BID and metolazone added with excellent diuretic effect.  For her COPD, pulmonology was consulted who recommended continued aggressive diuresis, ABG which showed significant CO2 retention, and autoimmune workup (labs below and negative). Echo noted pulmonary hypertension, but pulmonology declined to add any medications for this.  Patient with elevated Creatinine on admission to 1.5. Unclear baseline, curious whether Cr has been slowly increasing with worsening of other comorbidities.   Notably, UDS positive for opiates and patient reporting weeks of constipation. Large stool burden on KUB. Several large bowel movements after scheduled miralax and 1 soap suds enema.   Bipolar medications were continued.   Issues for Follow Up:  1. Continued tobacco cessation counseling - patient has been extensively counseled about the dangers of smoking while on home oxygen. 2. Continued substance  abuse counseling - patient denies any narcotics causing her UDS to be positive for opiates. Additionally, has noted significant constipation prior to arrival which may be related to recreational narcotic use.  3. CHF - discharged on lasix '80mg'$  PO BID and 2.'5mg'$  daily metolazone. Would have liked to observe patient's output on PO diuretics one more day, but patient adamant for discharge. Please follow fluid status and Cr closely.  4. COPD - patient's lung function is suggestive of possible end-stage COPD, may benefit from palliative consult in the future. 5. AKI - baseline Cr unclear (1-1.5). Expect baseline may be increasing. Please recheck BMP at hospital follow up.  6. HTN- discharged without home lisinopril given AKI resolving. Normotensive prior to discharge. Consider restarting as Cr allows. Verapamil was discontinued as patient does not need duplicate CCBs. 7. Oxybutynin was held due to AKI. Follow up incontinence symptoms.  8. Needs PFTs - patient refused these while hospitalized. 9. DME - patient wanted nasal pillow for home CPAP, ordered DME order inpatient, may need follow up with home care company.   Significant Procedures: none  Significant Labs and Imaging:   Recent Labs Lab 04/12/16 0938 04/13/16 0649 04/14/16 0529  WBC 8.8 13.8* 15.1*  HGB 11.7* 12.1 11.0*  HCT 40.1 40.7 36.8  PLT 440* 438* 400    Recent Labs Lab 04/10/16 1650 04/11/16 0655 04/12/16 0938 04/13/16 0649 04/14/16 0529 04/15/16 1020  NA 145 144 146* 143 142 139  K 3.8 3.8 4.5 4.9 4.3 3.6  CL 96* 96* 96* 97* 95* 91*  CO2 38* 39* 42* 37* 40* 38*  GLUCOSE 90 88 100* 141* 105* 85  BUN 24* 27* 27* 25* 27* 31*  CREATININE 1.44* 1.33* 1.37* 1.31* 1.26* 1.46*  CALCIUM 10.0 8.9 8.8* 9.5  9.7 9.5  MG 1.9  --   --   --   --   --   ALKPHOS 95  --   --   --   --   --   AST 20  --   --   --   --   --   ALT 18  --   --   --   --   --   ALBUMIN 3.3*  --   --   --   --   --     Trop 0.03, <0.03, <0.03 ANA  negative Anti-scleroderma antibody negative Sjogrens syndrome antibodies negative RF negative Anti-DNA antibody negative  Results/Tests Pending at Time of Discharge: none  Discharge Medications:  Allergies as of 04/15/2016      Reactions   Hydroxyzine Other (See Comments)   unknown   Codeine Nausea And Vomiting   Sulfa Antibiotics Itching      Medication List    STOP taking these medications   levofloxacin 500 MG tablet Commonly known as:  LEVAQUIN   lisinopril 40 MG tablet Commonly known as:  PRINIVIL,ZESTRIL   naproxen 500 MG tablet Commonly known as:  NAPROSYN   oxybutynin 5 MG tablet Commonly known as:  DITROPAN   verapamil 240 MG 24 hr capsule Commonly known as:  VERELAN PM     TAKE these medications   ACCU-CHEK AVIVA PLUS w/Device Kit CHECK BLOOD SUGAR THREE TIMES DAILY   acetaminophen 650 MG CR tablet Commonly known as:  TYLENOL 8 HOUR Take 1 tablet (650 mg total) by mouth every 8 (eight) hours as needed for pain.   albuterol 108 (90 Base) MCG/ACT inhaler Commonly known as:  PROVENTIL HFA;VENTOLIN HFA Inhale 2 puffs into the lungs every 6 (six) hours as needed.   albuterol (2.5 MG/3ML) 0.083% nebulizer solution Commonly known as:  PROVENTIL Take 3 mLs (2.5 mg total) by nebulization every 6 (six) hours as needed for wheezing or shortness of breath.   amLODipine 5 MG tablet Commonly known as:  NORVASC Take 2 tablets (10 mg total) by mouth daily. What changed:  See the new instructions.   aspirin 81 MG EC tablet Take 1 tablet (81 mg total) by mouth daily. Swallow whole.   atorvastatin 40 MG tablet Commonly known as:  LIPITOR Take 1 tablet (40 mg total) by mouth daily.   B-D SINGLE USE SWABS REGULAR Pads CHECK CAPILLARY BLOOD GLUCOSE ONE TIME DAILY   benztropine 0.5 MG tablet Commonly known as:  COGENTIN Take 1 tablet (0.5 mg total) by mouth daily.   BREO ELLIPTA 100-25 MCG/INH Aepb Generic drug:  fluticasone furoate-vilanterol INHALE 1 PUFF  EVERY DAY   divalproex 500 MG 24 hr tablet Commonly known as:  [name] ER Take 1 tablet (500 mg total) by mouth daily. What changed:  when to take this   escitalopram 20 MG tablet Commonly known as:  LEXAPRO Take 1 tablet (20 mg total) by mouth daily.   furosemide 40 MG tablet Commonly known as:  LASIX Take 2 tablets (80 mg total) by mouth 2 (two) times daily. What changed:  how much to take   gabapentin 600 MG tablet Commonly known as:  NEURONTIN Take 1 tablet (600 mg total) by mouth 3 (three) times daily.   glucose blood test strip Use to check blood sugar 3 times daily.   KLOR-CON M20 20 MEQ tablet Generic drug:  potassium chloride SA Take 1 tablet (20 mEq total) by mouth daily.   metFORMIN 500 MG  24 hr tablet Commonly known as:  GLUCOPHAGE-XR TAKE 1 TABLET TWICE DAILY   metolazone 2.5 MG tablet Commonly known as:  ZAROXOLYN Take 1 tablet (2.5 mg total) by mouth daily. Start taking on:  04/16/2016   mirtazapine 30 MG tablet Commonly known as:  REMERON Take 1 tablet (30 mg total) by mouth at bedtime.   montelukast 10 MG tablet Commonly known as:  SINGULAIR Take 1 tablet (10 mg total) by mouth at bedtime.   multivitamin with minerals tablet Take 1 tablet by mouth daily. Reported on 07/28/2015   omeprazole 40 MG capsule Commonly known as:  PRILOSEC Take 1 capsule (40 mg total) by mouth daily. What changed:  when to take this   predniSONE 50 MG tablet Commonly known as:  DELTASONE Take 1 tablet (50 mg total) by mouth daily with breakfast. For 2 more days What changed:  additional instructions   tiotropium 18 MCG inhalation capsule Commonly known as:  SPIRIVA HANDIHALER Place 1 capsule (18 mcg total) into inhaler and inhale daily.   ziprasidone 80 MG capsule Commonly known as:  GEODON Take 1 capsule (80 mg total) by mouth at bedtime.            Durable Medical Equipment        Start     Ordered   04/15/16 [phone]  For home use only DME continuous  positive airway pressure (CPAP)  Once    Comments:  Has CPAP at home, would like nasal pillow instead of full mask  Question Answer Comment  Patient has OSA or probable OSA Yes   Is the patient currently using CPAP in the home Yes   If yes (to question two) Determine DME provider and inform them of any new orders/settings   Settings Autotitration   CPAP supplies needed Mask, headgear, cushions, filters, heated tubing and water chamber      04/15/16 0939      Discharge Instructions: Please refer to Patient Instructions section of EMR for full details.  Patient was counseled important signs and symptoms that should prompt return to medical care, changes in medications, dietary instructions, activity restrictions, and follow up appointments.   Follow-Up Appointments: Follow-up Information    Lovenia Kim, MD. Go on 04/19/2016.   Why:  8:30am for hospital follow up Contact information: [place] [phone]           Sela Hua, MD 04/15/2016, 2:35 PM PGY-2, [place]

## 2016-04-11 NOTE — Progress Notes (Signed)
Patient reports pain her neck and also states tylenol 650 will not help her.  Will contact provider

## 2016-04-11 NOTE — Telephone Encounter (Signed)
Patient currently hospitalized for SOB and hypoxia.  Called pt who states her breathing is improved since admission, but still not to where she was last week after seeing me; she notes she's no where near her baseline. She feels she's urinating better with current regimen.  Of note, she does see pulmonology and cardiology; it may benefit her to follow up with them if she does not significantly improve. We've also had long discussions in the past about her tobacco use.  I appreciate the inpatient team for their great care.  Archie Patten, MD [facility] Family Medicine Resident  04/11/2016, 1:36 PM

## 2016-04-12 DIAGNOSIS — R062 Wheezing: Secondary | ICD-10-CM

## 2016-04-12 DIAGNOSIS — F1420 Cocaine dependence, uncomplicated: Secondary | ICD-10-CM

## 2016-04-12 DIAGNOSIS — G4733 Obstructive sleep apnea (adult) (pediatric): Secondary | ICD-10-CM

## 2016-04-12 DIAGNOSIS — J9620 Acute and chronic respiratory failure, unspecified whether with hypoxia or hypercapnia: Secondary | ICD-10-CM

## 2016-04-12 DIAGNOSIS — F17200 Nicotine dependence, unspecified, uncomplicated: Secondary | ICD-10-CM

## 2016-04-12 DIAGNOSIS — I5032 Chronic diastolic (congestive) heart failure: Secondary | ICD-10-CM

## 2016-04-12 LAB — BLOOD GAS, ARTERIAL
Acid-Base Excess: 12.7 mmol/L — ABNORMAL HIGH (ref 0.0–2.0)
Bicarbonate: 38.9 mmol/L — ABNORMAL HIGH (ref 20.0–28.0)
Drawn by: 257081
O2 Content: 6 L/min
O2 Saturation: 95.9 %
Patient temperature: 98.4
pCO2 arterial: 74.5 mmHg (ref 32.0–48.0)
pH, Arterial: 7.337 — ABNORMAL LOW (ref 7.350–7.450)
pO2, Arterial: 92.3 mmHg (ref 83.0–108.0)

## 2016-04-12 LAB — BASIC METABOLIC PANEL
Anion gap: 8 (ref 5–15)
BUN: 27 mg/dL — ABNORMAL HIGH (ref 6–20)
CO2: 42 mmol/L — ABNORMAL HIGH (ref 22–32)
Calcium: 8.8 mg/dL — ABNORMAL LOW (ref 8.9–10.3)
Chloride: 96 mmol/L — ABNORMAL LOW (ref 101–111)
Creatinine, Ser: 1.37 mg/dL — ABNORMAL HIGH (ref 0.44–1.00)
GFR calc Af Amer: 49 mL/min — ABNORMAL LOW (ref >60)
GFR calc non Af Amer: 42 mL/min — ABNORMAL LOW (ref >60)
Glucose, Bld: 100 mg/dL — ABNORMAL HIGH (ref 65–99)
Potassium: 4.5 mmol/L (ref 3.5–5.1)
Sodium: 146 mmol/L — ABNORMAL HIGH (ref 135–145)

## 2016-04-12 LAB — CBC
HCT: 40.1 % (ref 36.0–46.0)
Hemoglobin: 11.7 g/dL — ABNORMAL LOW (ref 12.0–15.0)
MCH: 27.7 pg (ref 26.0–34.0)
MCHC: 29.2 g/dL — ABNORMAL LOW (ref 30.0–36.0)
MCV: 94.8 fL (ref 78.0–100.0)
Platelets: 440 10*3/uL — ABNORMAL HIGH (ref 150–400)
RBC: 4.23 MIL/uL (ref 3.87–5.11)
RDW: 16.1 % — ABNORMAL HIGH (ref 11.5–15.5)
WBC: 8.8 10*3/uL (ref 4.0–10.5)

## 2016-04-12 LAB — GLUCOSE, CAPILLARY: Glucose-Capillary: 96 mg/dL (ref 65–99)

## 2016-04-12 LAB — SJOGRENS SYNDROME-B EXTRACTABLE NUCLEAR ANTIBODY: SSB (La) (ENA) Antibody, IgG: <0.2 AI (ref 0.0–0.9)

## 2016-04-12 LAB — ANTI-DNA ANTIBODY, DOUBLE-STRANDED: ds DNA Ab: <1 IU/mL (ref 0–9)

## 2016-04-12 LAB — SJOGRENS SYNDROME-A EXTRACTABLE NUCLEAR ANTIBODY: SSA (Ro) (ENA) Antibody, IgG: <0.2 AI (ref 0.0–0.9)

## 2016-04-12 LAB — RHEUMATOID FACTOR: Rhuematoid fact SerPl-aCnc: <10 IU/mL (ref 0.0–13.9)

## 2016-04-12 LAB — ANTI-SCLERODERMA ANTIBODY: Scleroderma (Scl-70) (ENA) Antibody, IgG: <0.2 AI (ref 0.0–0.9)

## 2016-04-12 LAB — ANA W/REFLEX IF POSITIVE: Anti Nuclear Antibody(ANA): NEGATIVE

## 2016-04-12 MED ORDER — METHYLPREDNISOLONE SODIUM SUCC 40 MG IJ SOLR
40.0000 mg | Freq: Two times a day (BID) | INTRAMUSCULAR | Status: DC
  Administered 2016-04-12 (×2): 40 mg via INTRAVENOUS
  Filled 2016-04-12 (×2): qty 1

## 2016-04-12 MED ORDER — IPRATROPIUM-ALBUTEROL 0.5-2.5 (3) MG/3ML IN SOLN
3.0000 mL | Freq: Four times a day (QID) | RESPIRATORY_TRACT | Status: DC
  Administered 2016-04-12 – 2016-04-15 (×11): 3 mL via RESPIRATORY_TRACT
  Filled 2016-04-12 (×10): qty 3

## 2016-04-12 NOTE — Evaluation (Signed)
Occupational Therapy Evaluation Patient Details Name: TUANA HOHEISEL MRN: [id number] DOB: [birth year] Today's Date: 04/12/2016    History of Present Illness  56 yo F with longstanding pulmonary and cardiac issues including HFpEF, OSA, COPD on 6L home O2, HTN, DM2 who presents with about a weeks worth of increasing dyspnea.   Clinical Impression   Pt reports she required assist with LB dressing but was otherwise mod I for ADL. Currently pt overall supervision for ADL and functional mobility with the exception of min assist for LB dressing. Pt presenting with decreased activity tolerance, SOB with activity, deconditioning impacting her independence and safety with ADL and functional mobility. SpO2=88% on 6L O2 with activity; returned to low 90s with seated rest break. Pt planning to d/c home with 24/7 supervision from family. Pt would benefit from continued skilled OT to address established goals.    Follow Up Recommendations  No OT follow up;Supervision/Assistance - 24 hour    Equipment Recommendations  None recommended by OT    Recommendations for Other Services       Precautions / Restrictions Precautions Precautions: Fall Restrictions Weight Bearing Restrictions: No      Mobility Bed Mobility               General bed mobility comments: up on EOB  Transfers Overall transfer level: Needs assistance   Transfers: Sit to/from Stand Sit to Stand: Supervision         General transfer comment: no UE's or assist needed    Balance Overall balance assessment: Needs assistance   Sitting balance-Leahy Scale: Good Sitting balance - Comments: based on righting and balance EOB during MMT     Standing balance-Leahy Scale: Good                             ADL either performed or assessed with clinical judgement   ADL Overall ADL's : Needs assistance/impaired Eating/Feeding: Independent;Sitting   Grooming: Supervision/safety;Standing   Upper Body  Bathing: Supervision/ safety;Sitting   Lower Body Bathing: Supervison/ safety;Sit to/from stand   Upper Body Dressing : Supervision/safety;Sitting   Lower Body Dressing: Minimal assistance;Sit to/from stand Lower Body Dressing Details (indicate cue type and reason): to adjust socks Toilet Transfer: Supervision/safety;Ambulation Toilet Transfer Details (indicate cue type and reason): simulated by sit to stand from EOB with functional mobility in room         Functional mobility during ADLs: Supervision/safety General ADL Comments: SpO2 down to 88% on 6L with activity; returned to low 90s with seated rest.     Vision Baseline Vision/History: Wears glasses Wears Glasses: At all times       Perception     Praxis      Pertinent Vitals/Pain Pain Assessment: No/denies pain     Hand Dominance Right   Extremity/Trunk Assessment Upper Extremity Assessment Upper Extremity Assessment: Overall WFL for tasks assessed   Lower Extremity Assessment Lower Extremity Assessment: Overall WFL for tasks assessed   Cervical / Trunk Assessment Cervical / Trunk Assessment: Normal   Communication Communication Communication: No difficulties   Cognition Arousal/Alertness: Awake/alert Behavior During Therapy: WFL for tasks assessed/performed Overall Cognitive Status: Within Functional Limits for tasks assessed                                     General Comments  sats on 6L return to low 90's in less  than a minute from upper 80's    Exercises     Shoulder Instructions      Home Living Family/patient expects to be discharged to:: Private residence Living Arrangements: Children;Non-relatives/Friends Available Help at Discharge: Family;Available 24 hours/day Type of Home: House Home Access: Ramped entrance     Home Layout: One level     Bathroom Shower/Tub: Teacher, early years/pre: Standard Bathroom Accessibility: Yes How Accessible: Accessible via  walker Home Equipment: Grab bars - tub/shower;Cane - single point;Walker - 2 wheels;Bedside commode   Additional Comments: home O2-6L      Prior Functioning/Environment Level of Independence: Independent with assistive device(s)        Comments: cane for mobility recently, drives, occasional assist with LB ADL        OT Problem List: Decreased activity tolerance;Impaired balance (sitting and/or standing);Cardiopulmonary status limiting activity      OT Treatment/Interventions: Self-care/ADL training;Energy conservation;DME and/or AE instruction;Patient/family education;Balance training    OT Goals(Current goals can be found in the care plan section) Acute Rehab OT Goals Patient Stated Goal: home independent OT Goal Formulation: With patient Time For Goal Achievement: 04/26/16 Potential to Achieve Goals: Good ADL Goals Pt Will Perform Upper Body Bathing: with modified independence;standing Pt Will Perform Lower Body Bathing: with modified independence Pt Will Transfer to Toilet: with modified independence;ambulating;regular height toilet Pt Will Perform Toileting - Clothing Manipulation and hygiene: with modified independence;sit to/from stand Pt Will Perform Tub/Shower Transfer: Tub transfer;with modified independence;ambulating Additional ADL Goal #1: Pt will independently verbalize 3 energy conservation strategies and utilize during ADL.  OT Frequency: Min 2X/week   Barriers to D/C:            Co-evaluation PT/OT/SLP Co-Evaluation/Treatment: Yes Reason for Co-Treatment: For patient/therapist safety;To address functional/ADL transfers PT goals addressed during session: Mobility/safety with mobility OT goals addressed during session: ADL's and self-care      End of Session Equipment Utilized During Treatment: Oxygen  Activity Tolerance: Patient tolerated treatment well Patient left: in chair;with call bell/phone within reach  OT Visit Diagnosis: Unsteadiness on  feet (R26.81)                Time: 0076-2263 OT Time Calculation (min): 14 min Charges:  OT General Charges $OT Visit: 1 Procedure OT Evaluation $OT Eval Moderate Complexity: 1 Procedure G-Codes:     Avonna Iribe A. Ulice Brilliant, M.S., OTR/L Pager: [place] 04/12/2016, 12:29 PM

## 2016-04-12 NOTE — Assessment & Plan Note (Signed)
consuleied to quit

## 2016-04-12 NOTE — Assessment & Plan Note (Signed)
Urine tox positive nov 2017 but negative 04/10/2016 but this can contriue to current picture  Plan Monitor Quit smoking as well

## 2016-04-12 NOTE — Progress Notes (Signed)
CRITICAL VALUE ALERT  Critical value received:  CO2 74.5  Date of notification:  04/12/2016   Time of notification:  12:49 PM   Critical value read back:Yes.    Nurse who received alert:  Elias Else D   MD notified (1st page):  Family Medicine Teaching Service  Time of first page:  12:49 PM   MD notified (2nd page):  Time of second page:  Responding MD:  Family Medicine Intern  Time MD responded:  1:35pm

## 2016-04-12 NOTE — Progress Notes (Signed)
Family Medicine Teaching Service Daily Progress Note Intern Pager: [phone]  Patient name: Tiffany [name] Medical record number: [id number] Date of birth: [birth year] Age: 56 y.o. Gender: female  Primary Care Provider: Kathrine Cords, MD Consultants: none Code Status: FULL  Pt Overview and Major Events to Date:  3/26 admitted with SOB, likely CHF exacerbation  Assessment and Plan: Tiffany [name] is a 56 y.o. female presenting with shortness of breath and 6 lb weight gain in 2 days. PMH is significant for HFpEF (echo 04/04/16 with EF 60-65% and G2DD, home lasix is 80mg  BID), obesity, T2DM, HLD, OSA, asthma, substance abuse (tobacco, hx of cocaine), bipolar disorder, COPD (home O2 is 6L), HTN.   Shortness of breath with history of COPD and HFpE, ongoing:  Multifactorial with combination of pulm HTN, obesity, poor compliance with CPAP, CHF exacerbation and COPD. Seen by pulm who recommended aggressive diuresis with 80mg  IV lasix BID, CPAP compliance and autoimmune workup.  No targeted drugs for pulm HTN.  Given 120mg  IV lasix upon admission and UOP was not documented yesterday.  - per Pulm will continue with lasix 80mg  IV BID -continue home Spiriva Breo -scheduled duonebs every 6 hours -solumedrol 40mg  q12hrs  - albuterol nebs PRN -strict I/Os -daily weights  Acute on Chronic Kidney Disease: Baseline per chart review appears to be around 1, however Cr has been 1.2-1.5 over the last few months. Ct elevated to 1.37 currently.  - cont to hold home gabapentin, oxybutynin, and Lisinopril  - daily BMP  Constipation: Small BM yesterday and feels more improved.  -scheduled miralax bid and senokot-s 2 tablets bid -soaps suds enema  T2DM: A1C 5.8 this admission. Home med is metformin 500 twice a day. CBGs WNL.  -CBG monitoring daily.  -Hold home Neurontin for peripheral neuropathy in the setting of possible AKI  History of drug abuse: patient reports last cocaine use November 2017. UDS  with narcotics, patient denies use and none rx'd in database. Likely contributing to constipation as above. -monitor for withdrawal symptoms  Bipolar 1 disorder/Schizophernia. At home on Remeron 30 mg daily, Geodon 160 mgand Lexapro. Follows with psychiatry. Mood stable on my exam. -Continue Lexapro 20 mg po daily -Continue Geodon 80 mg nightly -Continue home Cogentin 0.5 mg daily  -Continue home [name] 500 mg nightly -Continue home Remeron 30 mg nightly  Urge incontinence: Home med is oxybutynin. Will hold this given up trending creatinine as an outpatient and unclear voiding history. -Hold home oxybutynin -Monitor intake and output  HTN: Normotensive, continue current regimen. -Hold home verapamil, patient is on 2 calcium channel blockers in PCP was attempting to stop this -Continue Norvasc. May need to increase this dose since we are stopping Verapamil. -Hold home lisinopril in the setting of possible AKI -Monitor blood pressure  HLD. Last lipid panel less than average risk 11/2015, continue home statin. -Continue Lipitor 40 mg daily  OSA: States she sometimes uses CPAP at home and sometimes does not. -CPAP while hospitalized - Pulm recommending better compliance   Tobacco use: Patient currently smokes 1.5 packs per day. -Nicotine patch while hospitalized.  FEN/GI: Regular diet per patient preference Prophylaxis: Heparin  Disposition: continued inpatient management of SOB  Subjective:  Feels that breathing is somewhat improved from yesterday.  Had small bowel movement yesterday, but feels like she could have another.   Objective: Temp:  [98.1 F (36.7 C)-98.9 F (37.2 C)] 98.4 F (36.9 C) (03/28 0546) Pulse Rate:  [74-83] 74 (03/28 0553) Resp:  [18] 18 (03/28  0546) BP: (97-110)/(55-70) 110/55 (03/28 0546) SpO2:  [94 %-98 %] 94 % (03/28 0553) Weight:  [324 lb (147 kg)] 324 lb (147 kg) (03/28 0551) Physical Exam: General: Obese female resting in chair  in NAD. Cardiovascular: RRR, no murmur Respiratory: diffuse wheezing, mild bibasilar crackles, easy WOB on home 6L [place] Abdomen: soft, distended, nontender, +BS Extremities: 1+ pitting edema to knees bilaterally, WWP  Laboratory:  Recent Labs Lab 04/10/16 1650 04/11/16 0655  WBC 14.6* 10.6*  HGB 11.1* 10.8*  HCT 37.0 36.7  PLT 416* 403*    Recent Labs Lab 04/05/16 1227 04/10/16 1650 04/11/16 0655  NA 145* 145 144  K 3.6 3.8 3.8  CL 98 96* 96*  CO2 33* 38* 39*  BUN 14 24* 27*  CREATININE 1.20* 1.44* 1.33*  CALCIUM 9.9 10.0 8.9  PROT  --  6.1*  --   BILITOT  --  0.4  --   ALKPHOS  --  95  --   ALT  --  18  --   AST  --  20  --   GLUCOSE 84 90 88   UDS + opiates Trop 0.03, <0.03, <0.03  Imaging/Diagnostic Tests: No results found.  Eloise Levels, MD 04/12/2016, 8:41 AM PGY-1, [place] Intern pager: [phone], text pages welcome

## 2016-04-12 NOTE — Assessment & Plan Note (Addendum)
Start steroids due to weheezing - Primary service to taper this over 8 days from 04/13/2016  Get spiro with flow volume loop to look for evidence of VCD; still pending 04/13/2016

## 2016-04-12 NOTE — Consult Note (Addendum)
Name: CHRISTINA GINTZ MRN: [id number] DOB: [birth year]    ADMISSION DATE:  04/10/2016 CONSULTATION DATE:  04/11/16  REFERRING MD :  [name] Riis  CHIEF COMPLAINT:  SOB  brief:  DARSHA ZUMSTEIN is a 56 y.o. female with a PMH as outlined below including O2 dependent COPD with continued tobacco use and hx cocaine use and OSA with occasional CPAP use (followed by Dr. Lamonte Sakai, last seen Dec 2017). She had been seen in IMTS clinic multiple times over a week long period for dyspnea and had recommendation to be admitted but she refused.  On 3/26, she was finally admitted for [facility] exacerbation (6lb weight gain in 2 days along with increase in orthopnea and LE edema).  She was diuresed and continued on preadmission BD's.  As outpatient, she is on 40mg  Lasix BID but reportedly this was recently increased to 80mg  BID and she has continued at this dose  Echo from 04/04/16 with EF 60-65%, G2DD, PAP 49.   EVENTS 3/27 - ccm assessment:  She has multifactorial secondary pulmonary hypertension. This is almost certainly related to her obesity/OSA, COPD which I believe is significant, hypertension with diastolic dysfunction. Could consider autoimmune disease although there have been no symptoms to support it. In absence of such I would continue to concentrate on appliance with CPAP, good compliance with oxygen and her bronchodilators, tight blood pressure control and optimal volume status. I don't believe she needs any targeted pulmonary hypertension medication at this time. If her autoimmune evaluation shows any evidence of a connective tissue disease that could contribute to pulmonary hypertension then there may be a role for these medications.    SUBJECTIVE/OVERNIGHT/INTERVAL HX 328 - now using cpap qhs. (non compliant at home). Declined permanaent trach. Agrees to cpap qhs. Mildly better. On 6L [place] at da time - home baseline. STill with wheeze but reports is better. Smookes    reports that she has been smoking  Cigarettes.  She started smoking about 39 years ago. She has a 57.00 pack-year smoking history. She has never used smokeless tobacco.   VITAL SIGNS: Vitals:   04/12/16 0546 04/12/16 0553  BP: (!) 110/55   Pulse: 77 74  Resp: 18   Temp: 98.4 F (36.9 C)   ;s  EXAM  General Appearance:    Looks cOBESE - yes  Head:    Normocephalic, without obvious abnormality, atraumatic  Eyes:    PERRL - yes, conjunctiva/corneas - clear      Ears:    Normal external ear canals, both ears  Nose:   NG tube - no  Throat:  ETT TUBE - no , OG tube - no  Neck:   Supple,  No enlargement/tenderness/nodules     Lungs:     Exp wheeze that is forced from upper airway ++ - flcutuates  Chest wall:    No deformity  Heart:    S1 and S2 normal, no murmur, CVP - no.  Pressors - no  Abdomen:     Soft, no masses, no organomegaly  Genitalia:    Not done  Rectal:   not done  Extremities:   Extremities- mild edema     Skin:   Intact in exposed areas .     Neurologic:   Sedation - none -> RASS - +1 . Moves all 4s - yes, sitting on side of bed. CAM-ICU - neg . Orientation - x3 +       LABS  PULMONARY No results for input(s): PHART, PCO2ART, PO2ART, HCO3,  TCO2, O2SAT in the last 168 hours.  Invalid input(s): PCO2, PO2  CBC  Recent Labs Lab 04/10/16 1650 04/11/16 0655 04/12/16 0938  HGB 11.1* 10.8* 11.7*  HCT 37.0 36.7 40.1  WBC 14.6* 10.6* 8.8  PLT 416* 403* 440*    COAGULATION No results for input(s): INR in the last 168 hours.  CARDIAC   Recent Labs Lab 04/10/16 1922 04/10/16 2310 04/11/16 0655  TROPONINI 0.03* <0.03 <0.03   No results for input(s): PROBNP in the last 168 hours.   CHEMISTRY  Recent Labs Lab 04/05/16 1227 04/10/16 1650 04/11/16 0655  NA 145* 145 144  K 3.6 3.8 3.8  CL 98 96* 96*  CO2 33* 38* 39*  GLUCOSE 84 90 88  BUN 14 24* 27*  CREATININE 1.20* 1.44* 1.33*  CALCIUM 9.9 10.0 8.9  MG  --  1.9  --    Estimated Creatinine Clearance: 68.3 mL/min (A) (by  C-G formula based on SCr of 1.33 mg/dL (H)).   LIVER  Recent Labs Lab 04/10/16 1650  AST 20  ALT 18  ALKPHOS 95  BILITOT 0.4  PROT 6.1*  ALBUMIN 3.3*     INFECTIOUS No results for input(s): LATICACIDVEN, PROCALCITON in the last 168 hours.   ENDOCRINE CBG (last 3)   Recent Labs  04/11/16 0805 04/12/16 0812  GLUCAP 87 96         IMAGING x48h  - image(s) personally visualized  -   highlighted in bold X-ray Chest Pa And Lateral  Result Date: 04/10/2016 CLINICAL DATA:  Chronic shortness of Breath EXAM: CHEST  2 VIEW COMPARISON:  April 05, 2016 FINDINGS: There is no edema or consolidation. Heart is borderline enlarged with pulmonary vascularity within normal limits. No adenopathy. There is degenerative change in thoracic spine. IMPRESSION: Borderline cardiac enlargement.  No edema or consolidation. Electronically Signed   By: Lowella Grip III M.D.   On: 04/10/2016 17:28   Abd 1 View (kub)  Result Date: 04/10/2016 CLINICAL DATA:  Constipation for 1 month. EXAM: ABDOMEN - 1 VIEW COMPARISON:  None FINDINGS: There is a marked stool burden identified throughout the colon. No dilated loops of small bowel identified. No abnormal abdominal or pelvic calcifications. IMPRESSION: 1. Marked stool burden identified within the colon compatible with the clinical history of constipation. Electronically Signed   By: Kerby Moors M.D.   On: 04/10/2016 17:28       ASSESSMENT and PLAN  Acute-on-chronic respiratory failure ([place]) Multifactorial due to reasons listed in a/p  Plan Ensure bipap qhs Keep pulse ox > 88% Check abg  (HFpEF) heart failure with preserved ejection fraction (HCC) cotninue lasix  Cocaine use disorder, severe, dependence ([name]) Urine tox positive nov 2017 but negative 04/10/2016 but this can contriue to current picture  Plan Monitor Quit smoking as well  Tobacco use disorder consuleied to quit  Obstructive sleep apnea Instructed on cpap qhs  compliance  COPD exacerbation ([place]) Start steroids due to weheezing Get spiro with flow volume loop to look for evidence of VCD  Wheezing Dc lisinopril      FAMILY  - Updates: 04/12/2016 --> patient updated  - Inter-disciplinary family meet or Palliative Care meeting due by:  DAy 7. Current LOS is LOS 2 days  CODE STATUS    Code Status Orders        Start     Ordered   04/10/16 1628  Full code  Continuous     04/10/16 1632    Code Status History  Date Active Date Inactive Code Status Order ID Comments User Context   12/01/2015  9:44 PM 12/06/2015  7:20 PM Full Code [id number]  Clovis Fredrickson, MD Inpatient   11/25/2015  1:41 AM 12/01/2015  9:37 PM Full Code [id number]  Elberta Leatherwood, MD Inpatient   08/19/2015  9:34 PM 08/23/2015  4:46 PM Full Code [id number]  [name], DO Inpatient   07/09/2015 12:10 PM 07/11/2015  6:03 PM Full Code [id number]  Jeannett Senior, PA-C ED   06/22/2015  5:49 AM 06/25/2015  6:52 PM Full Code [id number]  Juanito Doom, MD ED   06/22/2015  2:33 AM 06/22/2015  5:49 AM Full Code [id number]  Rogue Bussing, MD ED   07/08/2014  3:31 AM 07/09/2014  6:36 PM Full Code [id number]  Olin Hauser, DO Inpatient   01/04/2013  5:27 AM 01/06/2013  9:02 PM Full Code [id number]  Leeanne Rio, MD Inpatient   04/24/2011  5:42 PM 04/27/2011  9:19 PM Full Code [id number]  Campbell Lerner, RN Inpatient        DISPO cotnue admission stautus     Dr. Brand Males, M.D., F.C.C.P Pulmonary and Critical Care Medicine Staff Physician [place] [facility] Pager: [phone], If no answer or between  15:00h - 7:00h: call [phone]  04/12/2016 11:22 AM

## 2016-04-12 NOTE — Assessment & Plan Note (Addendum)
Multifactorial due to reasons listed in a/p. ABG with definite CO2 retention  Plan Ensure bipap qhs (prefer bipap over cpap) Keep pulse ox > 88% [name] candidate for long term trach if she does not wear/bipap at home

## 2016-04-12 NOTE — Assessment & Plan Note (Signed)
D/c lisinopril

## 2016-04-12 NOTE — Assessment & Plan Note (Addendum)
Change to bipap qhs due to high co2

## 2016-04-12 NOTE — Assessment & Plan Note (Signed)
cotninue lasix

## 2016-04-12 NOTE — Evaluation (Signed)
Physical Therapy Evaluation Patient Details Name: Tiffany [name] MRN: [id number] DOB: 1960/12/24 Today's Date: 04/12/2016   History of Present Illness   56 yo F with longstanding pulmonary and cardiac issues including HFpEF, OSA, COPD on 6L home O2, HTN, DM2 who presents with about a weeks worth of increasing dyspnea.  Clinical Impression  Pt admitted with/for worsening dyspnea. Pt is not back to baseline, but mobilizes on her 6L O2 at a supervision level. Pt currently limited functionally due to the problems listed below.  (see problems list.)  Pt will benefit from PT to maximize function and safety to be able to get home safely with available assist.     Follow Up Recommendations No PT follow up    Equipment Recommendations  None recommended by PT    Recommendations for Other Services       Precautions / Restrictions Precautions Precautions: Fall Restrictions Weight Bearing Restrictions: No      Mobility  Bed Mobility               General bed mobility comments: up on EOB  Transfers Overall transfer level: Needs assistance   Transfers: Sit to/from Stand Sit to Stand: Supervision         General transfer comment: no UE's or assist needed  Ambulation/Gait Ambulation/Gait assistance: Supervision Ambulation Distance (Feet): 180 Feet Assistive device: None (pusing portable O2) Gait Pattern/deviations: Step-through pattern Gait velocity: moderate speed limited by pulmonary status Gait velocity interpretation: at or above normal speed for age/gender General Gait Details: steady and functional speed with increasing dyspnea.  sats on 6L dropped to 89% at 93 bpm  Stairs            Wheelchair Mobility    Modified Rankin (Stroke Patients Only)       Balance Overall balance assessment: Needs assistance   Sitting balance-Leahy Scale: Good Sitting balance - Comments: based on righting and balance EOB during MMT     Standing balance-Leahy Scale:  Good                               Pertinent Vitals/Pain Pain Assessment: No/denies pain    Home Living Family/patient expects to be discharged to:: Private residence Living Arrangements: Children;Non-relatives/Friends Available Help at Discharge: Family;Available 24 hours/day Type of Home: House Home Access: Ramped entrance     Home Layout: One level Home Equipment: Grab bars - tub/shower;Cane - single point;Walker - 2 wheels;Bedside commode Additional Comments: home O2-6L    Prior Function Level of Independence: Independent with assistive device(s)         Comments: cane for mobility recently, drives, occasional assist with ADL     Hand Dominance   Dominant Hand: Right    Extremity/Trunk Assessment   Upper Extremity Assessment Upper Extremity Assessment: Defer to OT evaluation    Lower Extremity Assessment Lower Extremity Assessment: Overall WFL for tasks assessed       Communication   Communication: No difficulties  Cognition Arousal/Alertness: Awake/alert Behavior During Therapy: WFL for tasks assessed/performed Overall Cognitive Status: Within Functional Limits for tasks assessed                                        General Comments General comments (skin integrity, edema, etc.): sats on 6L return to low 90's in less than a minute from upper 80's  Exercises     Assessment/Plan    PT Assessment Patient needs continued PT services  PT Problem List Decreased activity tolerance;Decreased mobility;Cardiopulmonary status limiting activity       PT Treatment Interventions Gait training;Stair training;Functional mobility training;Therapeutic activities;Patient/family education    PT Goals (Current goals can be found in the Care Plan section)  Acute Rehab PT Goals Patient Stated Goal: home independent PT Goal Formulation: With patient Time For Goal Achievement: 04/19/16 Potential to Achieve Goals: Good    Frequency  Min 3X/week   Barriers to discharge        Co-evaluation PT/OT/SLP Co-Evaluation/Treatment: Yes Reason for Co-Treatment: For patient/therapist safety;To address functional/ADL transfers PT goals addressed during session: Mobility/safety with mobility         End of Session Equipment Utilized During Treatment: Oxygen Activity Tolerance: Patient tolerated treatment well Patient left: in chair;with call bell/phone within reach Nurse Communication: Mobility status PT Visit Diagnosis: Other abnormalities of gait and mobility (R26.89)    Time: 7616-0737 PT Time Calculation (min) (ACUTE ONLY): 14 min   Charges:   PT Evaluation $PT Eval Moderate Complexity: 1 Procedure     PT G Codes:        04/14/16  Tiffany [name], PT [phone]  (pager)  Tiffany [name] 14-Apr-2016, 11:29 AM

## 2016-04-13 DIAGNOSIS — J9621 Acute and chronic respiratory failure with hypoxia: Secondary | ICD-10-CM

## 2016-04-13 DIAGNOSIS — J9622 Acute and chronic respiratory failure with hypercapnia: Secondary | ICD-10-CM

## 2016-04-13 LAB — CBC
HCT: 40.7 % (ref 36.0–46.0)
Hemoglobin: 12.1 g/dL (ref 12.0–15.0)
MCH: 27.6 pg (ref 26.0–34.0)
MCHC: 29.7 g/dL — ABNORMAL LOW (ref 30.0–36.0)
MCV: 92.9 fL (ref 78.0–100.0)
Platelets: 438 10*3/uL — ABNORMAL HIGH (ref 150–400)
RBC: 4.38 MIL/uL (ref 3.87–5.11)
RDW: 15.7 % — ABNORMAL HIGH (ref 11.5–15.5)
WBC: 13.8 10*3/uL — ABNORMAL HIGH (ref 4.0–10.5)

## 2016-04-13 LAB — BASIC METABOLIC PANEL
Anion gap: 9 (ref 5–15)
BUN: 25 mg/dL — ABNORMAL HIGH (ref 6–20)
CO2: 37 mmol/L — ABNORMAL HIGH (ref 22–32)
Calcium: 9.5 mg/dL (ref 8.9–10.3)
Chloride: 97 mmol/L — ABNORMAL LOW (ref 101–111)
Creatinine, Ser: 1.31 mg/dL — ABNORMAL HIGH (ref 0.44–1.00)
GFR calc Af Amer: 52 mL/min — ABNORMAL LOW (ref >60)
GFR calc non Af Amer: 45 mL/min — ABNORMAL LOW (ref >60)
Glucose, Bld: 141 mg/dL — ABNORMAL HIGH (ref 65–99)
Potassium: 4.9 mmol/L (ref 3.5–5.1)
Sodium: 143 mmol/L (ref 135–145)

## 2016-04-13 LAB — GLUCOSE, CAPILLARY: Glucose-Capillary: 154 mg/dL — ABNORMAL HIGH (ref 65–99)

## 2016-04-13 MED ORDER — PREDNISONE 50 MG PO TABS
50.0000 mg | ORAL_TABLET | Freq: Every day | ORAL | Status: DC
  Administered 2016-04-13 – 2016-04-15 (×3): 50 mg via ORAL
  Filled 2016-04-13 (×3): qty 1

## 2016-04-13 MED ORDER — METOLAZONE 2.5 MG PO TABS
2.5000 mg | ORAL_TABLET | Freq: Every day | ORAL | Status: DC
Start: 2016-04-13 — End: 2016-04-15
  Administered 2016-04-13 – 2016-04-15 (×3): 2.5 mg via ORAL
  Filled 2016-04-13 (×3): qty 1

## 2016-04-13 MED ORDER — FUROSEMIDE 10 MG/ML IJ SOLN
160.0000 mg | Freq: Two times a day (BID) | INTRAVENOUS | Status: DC
  Administered 2016-04-13 – 2016-04-14 (×3): 160 mg via INTRAVENOUS
  Filled 2016-04-13 (×4): qty 16

## 2016-04-13 NOTE — Progress Notes (Signed)
Pt urinated 900 cc, MD notified.

## 2016-04-13 NOTE — Progress Notes (Signed)
Pt had 360 ml PO intake during the night and no urinary output, lasix schedule every 12 hr given. Bladder scan at 0100 am indicated 230 ml. MD notified. Instructions given to perform another bladder scan.

## 2016-04-13 NOTE — Progress Notes (Signed)
Name: Tiffany [name] MRN: [id number] DOB: 02-16-1960    ADMISSION DATE:  04/10/2016 CONSULTATION DATE:  04/11/16  REFERRING MD :  [name] Riis  CHIEF COMPLAINT:  SOB  brief:  Tiffany [name] is a 56 y.o. female with a PMH as outlined below including O2 dependent COPD with continued tobacco use and hx cocaine use and OSA with occasional CPAP use (followed by Dr. Lamonte Sakai, last seen Dec 2017). She had been seen in IMTS clinic multiple times over a week long period for dyspnea and had recommendation to be admitted but she refused.  On 3/26, she was finally admitted for [facility] exacerbation (6lb weight gain in 2 days along with increase in orthopnea and LE edema).  She was diuresed and continued on preadmission BD's.  As outpatient, she is on 40mg  Lasix BID but reportedly this was recently increased to 80mg  BID and she has continued at this dose  Echo from 04/04/16 with EF 60-65%, G2DD, PAP 49.   EVENTS 3/27 - ccm assessment:  She has multifactorial secondary pulmonary hypertension. This is almost certainly related to her obesity/OSA, COPD which I believe is significant, hypertension with diastolic dysfunction. Could consider autoimmune disease although there have been no symptoms to support it. In absence of such I would continue to concentrate on appliance with CPAP, good compliance with oxygen and her bronchodilators, tight blood pressure control and optimal volume status. I don't believe she needs any targeted pulmonary hypertension medication at this time. If her autoimmune evaluation shows any evidence of a connective tissue disease that could contribute to pulmonary hypertension then there may be a role for these medications.   328 - now using cpap qhs. (non compliant at home). Declined permanaent trach. Agrees to cpap qhs. Mildly better. On 6L [place] at da time - home baseline. STill with wheeze but reports is better. Smookes     SUBJECTIVE/OVERNIGHT/INTERVAL HX 04/13/16 - ace inhibtor off -> reports  wheeze better. YEt to have spirometry for possible VCD. ABG shows chronic hypercarbia resp acisois    reports that she has been smoking Cigarettes.  She started smoking about 39 years ago. She has a 57.00 pack-year smoking history. She has never used smokeless tobacco.  EXAM  General Appearance:    Looks criticall ill OBESE - yes  Head:    Normocephalic, without obvious abnormality, atraumatic  Eyes:    PERRL - yes, conjunctiva/corneas - clear      Ears:    Normal external ear canals, both ears  Nose:   NG tube - no  Throat:  ETT TUBE - no , OG tube - no  Neck:   Supple,  No enlargement/tenderness/nodules     Lungs:     No increased wob. Some forced wheeze + but better  Chest wall:    No deformity  Heart:    S1 and S2 normal, no murmur, CVP - o.  Pressors - no  Abdomen:     Soft, no masses, no organomegaly  Genitalia:    Not done  Rectal:   not done  Extremities:   Extremities- mild edema     Skin:   Intact in exposed areas .     Neurologic:   Sedation - none -> RASS - +1 sitting in charir . Moves all 4s - yes. CAM-ICU - neg . Orientation - x 3 +       LABS  PULMONARY  Recent Labs Lab 04/12/16 1125  PHART 7.337*  PCO2ART 74.5*  PO2ART 92.3  HCO3  38.9*  O2SAT 95.9    CBC  Recent Labs Lab 04/11/16 0655 04/12/16 0938 04/13/16 0649  HGB 10.8* 11.7* 12.1  HCT 36.7 40.1 40.7  WBC 10.6* 8.8 13.8*  PLT 403* 440* 438*    COAGULATION No results for input(s): INR in the last 168 hours.  CARDIAC   Recent Labs Lab 04/10/16 1922 04/10/16 2310 04/11/16 0655  TROPONINI 0.03* <0.03 <0.03   No results for input(s): PROBNP in the last 168 hours.   CHEMISTRY  Recent Labs Lab 04/10/16 1650 04/11/16 0655 04/12/16 0938 04/13/16 0649  NA 145 144 146* 143  K 3.8 3.8 4.5 4.9  CL 96* 96* 96* 97*  CO2 38* 39* 42* 37*  GLUCOSE 90 88 100* 141*  BUN 24* 27* 27* 25*  CREATININE 1.44* 1.33* 1.37* 1.31*  CALCIUM 10.0 8.9 8.8* 9.5  MG 1.9  --   --   --     Estimated Creatinine Clearance: 62.8 mL/min (A) (by C-G formula based on SCr of 1.31 mg/dL (H)).   LIVER  Recent Labs Lab 04/10/16 1650  AST 20  ALT 18  ALKPHOS 95  BILITOT 0.4  PROT 6.1*  ALBUMIN 3.3*     INFECTIOUS No results for input(s): LATICACIDVEN, PROCALCITON in the last 168 hours.   ENDOCRINE CBG (last 3)   Recent Labs  04/11/16 0805 04/12/16 0812 04/13/16 0752  GLUCAP 87 96 154*         IMAGING x48h  - image(s) personally visualized  -   highlighted in bold No results found.     ASSESSMENT and PLAN  Acute on chronic respiratory failure with hypoxia and hypercapnia (HCC) Multifactorial due to reasons listed in a/p. ABG with definite CO2 retention  Plan Ensure bipap qhs (prefer bipap over cpap) Keep pulse ox > 88% [name] candidate for long term trach if she does not wear/bipap at home   (HFpEF) heart failure with preserved ejection fraction (HCC) cotninue lasix  Cocaine use disorder, severe, dependence ([place]) Urine tox positive nov 2017 but negative 04/10/2016 but this can contriue to current picture  Plan Monitor Quit smoking as well  Tobacco use disorder consuleied to quit  Obstructive sleep apnea Change to bipap qhs due to high co2  COPD exacerbation ([place]) Start steroids due to weheezing - Primary service to taper this over 8 days from 04/13/2016  Get spiro with flow volume loop to look for evidence of VCD; still pending 04/13/2016   Wheezing Dc lisinopril      FAMILY  - Updates: 04/13/2016 --> patient at bedside  - Inter-disciplinary family meet or Palliative Care meeting due by:  DAy 7. Current LOS is LOS 3 days  CODE STATUS    Code Status Orders        Start     Ordered   04/10/16 1628  Full code  Continuous     04/10/16 1632    Code Status History    Date Active Date Inactive Code Status Order ID Comments User Context   12/01/2015  9:44 PM 12/06/2015  7:20 PM Full Code [id number]  Clovis Fredrickson, MD  Inpatient   11/25/2015  1:41 AM 12/01/2015  9:37 PM Full Code [id number]  Elberta Leatherwood, MD Inpatient   08/19/2015  9:34 PM 08/23/2015  4:46 PM Full Code [id number]  [name], DO Inpatient   07/09/2015 12:10 PM 07/11/2015  6:03 PM Full Code [id number]  Jeannett Senior, PA-C ED   06/22/2015  5:49 AM 06/25/2015  6:52  PM Full Code [id number]  Juanito Doom, MD ED   06/22/2015  2:33 AM 06/22/2015  5:49 AM Full Code [id number]  Rogue Bussing, MD ED   07/08/2014  3:31 AM 07/09/2014  6:36 PM Full Code [id number]  Olin Hauser, DO Inpatient   01/04/2013  5:27 AM 01/06/2013  9:02 PM Full Code [id number]  Leeanne Rio, MD Inpatient   04/24/2011  5:42 PM 04/27/2011  9:19 PM Full Code [id number]  Campbell Lerner, RN Inpatient        DISPO Per [place] wil round again Monday 04/17/16   Dr. Brand Males, M.D., F.C.C.P Pulmonary and Critical Care Medicine Staff Physician [place] [facility] Pager: [phone], If no answer or between  15:00h - 7:00h: call [phone]  04/13/2016 12:08 PM

## 2016-04-13 NOTE — Progress Notes (Signed)
Family Medicine Teaching Service Daily Progress Note Intern Pager: [phone]  Patient name: Tiffany [name] Medical record number: [id number] Date of birth: 01/23/1960 Age: 56 y.o. Gender: female  Primary Care Provider: Kathrine Cords, MD Consultants: none Code Status: FULL  Pt Overview and Major Events to Date:  3/26 admitted with SOB, likely CHF exacerbation  Assessment and Plan: Tiffany [name] is a 56 y.o. female presenting with shortness of breath and 6 lb weight gain in 2 days. PMH is significant for HFpEF (echo 04/04/16 with EF 60-65% and G2DD, home lasix is 80mg  BID), obesity, T2DM, HLD, OSA, asthma, substance abuse (tobacco, hx of cocaine), bipolar disorder, COPD (home O2 is 6L), HTN.   Shortness of breath with history of COPD and HFpE, ongoing:  Multifactorial with combination of pulm HTN, obesity, poor compliance with CPAP, CHF exacerbation and COPD. Seen by pulm who recommended aggressive diuresis with 80mg  IV lasix BID, CPAP compliance and autoimmune workup.  No targeted drugs for pulm HTN.   - per Pulm will continue with aggressive diuresis, changed to lasix 160mg  BID and added metolazone -continue home Spiriva Breo -scheduled duonebs every 6 hours - transition to PO prednisone - albuterol nebs PRN -strict I/Os -daily weights  Acute on Chronic Kidney Disease: Baseline per chart review appears to be around 1, however Cr has been 1.2-1.5 over the last few months. Ct elevated to 1.31 currently.  - cont to hold home gabapentin, oxybutynin, and Lisinopril  - daily BMP  Constipation: Small BM yesterday and feels more improved. BM 3/28.  -scheduled miralax bid and senokot-s 2 tablets bid  T2DM: A1C 5.8 this admission. Home med is metformin 500 twice a day. CBGs WNL.  -CBG monitoring daily.  -Hold home Neurontin for peripheral neuropathy in the setting of possible AKI  History of drug abuse: patient reports last cocaine use November 2017. UDS with narcotics, patient denies  use and none rx'd in database. Likely contributing to constipation as above. -monitor for withdrawal symptoms  Bipolar 1 disorder/Schizophernia. At home on Remeron 30 mg daily, Geodon 160 mgand Lexapro. Follows with psychiatry. Mood stable on my exam. -Continue Lexapro 20 mg po daily -Continue Geodon 80 mg nightly -Continue home Cogentin 0.5 mg daily  -Continue home [name] 500 mg nightly -Continue home Remeron 30 mg nightly  Urge incontinence: Home med is oxybutynin. Will hold this given up trending creatinine as an outpatient and unclear voiding history. -Hold home oxybutynin -Monitor intake and output  HTN: Normotensive, continue current regimen. -Hold home verapamil, patient is on 2 calcium channel blockers in PCP was attempting to stop this -Continue Norvasc. May need to increase this dose since we are stopping Verapamil. -Hold home lisinopril in the setting of possible AKI -Monitor blood pressure  HLD. Last lipid panel less than average risk 11/2015, continue home statin. -Continue Lipitor 40 mg daily  OSA: States she sometimes uses CPAP at home and sometimes does not. -CPAP while hospitalized - Pulm recommending better compliance   Tobacco use: Patient currently smokes 1.5 packs per day. -Nicotine patch while hospitalized.  FEN/GI: Regular diet per patient preference Prophylaxis: Heparin  Disposition: continued inpatient management of SOB  Subjective:  Sleeping with CPAP on, arousable.   Objective: Temp:  [97.6 F (36.4 C)-99 F (37.2 C)] 97.6 F (36.4 C) (03/29 0512) Pulse Rate:  [81-95] 81 (03/29 0512) Resp:  [18-20] 18 (03/29 0512) BP: (129-157)/(65-81) 157/81 (03/29 0512) SpO2:  [86 %-100 %] 100 % (03/29 0512) Weight:  [276 lb 3.2 oz (125.3  kg)] 276 lb 3.2 oz (125.3 kg) (03/28 1158) Physical Exam: General: Obese female resting in chair in NAD. Cardiovascular: RRR, no murmur Respiratory: diffuse wheezing, mild bibasilar crackles, easy WOB on  CPAP Abdomen: soft, distended, nontender, +BS Extremities: 1+ pitting edema to knees bilaterally, WWP  Laboratory:  Recent Labs Lab 04/11/16 0655 04/12/16 0938 04/13/16 0649  WBC 10.6* 8.8 13.8*  HGB 10.8* 11.7* 12.1  HCT 36.7 40.1 40.7  PLT 403* 440* 438*    Recent Labs Lab 04/10/16 1650 04/11/16 0655 04/12/16 0938 04/13/16 0649  NA 145 144 146* 143  K 3.8 3.8 4.5 4.9  CL 96* 96* 96* 97*  CO2 38* 39* 42* 37*  BUN 24* 27* 27* 25*  CREATININE 1.44* 1.33* 1.37* 1.31*  CALCIUM 10.0 8.9 8.8* 9.5  PROT 6.1*  --   --   --   BILITOT 0.4  --   --   --   ALKPHOS 95  --   --   --   ALT 18  --   --   --   AST 20  --   --   --   GLUCOSE 90 88 100* 141*   UDS + opiates Trop 0.03, <0.03, <0.03  Imaging/Diagnostic Tests: No results found.  Sela Hilding, MD 04/13/2016, 9:35 AM PGY-1, [place] Intern pager: [phone], text pages welcome

## 2016-04-13 NOTE — Progress Notes (Signed)
qPhysical Therapy Treatment Patient Details Name: Tiffany [name] MRN: [id number] DOB: 1960-06-08 Today's Date: 04/13/2016    History of Present Illness  56 yo F with longstanding pulmonary and cardiac issues including HFpEF, OSA, COPD on 6L home O2, HTN, DM2 who presents with about a weeks worth of increasing dyspnea.    PT Comments    Pt performed gait with emphasis on conservation of energy.  Decreased gait distance during session but able to maintain saturations above 90% with a lengthy standing rest break mid way through gait.  Pt refused stair training and reports she has a ramp at home that she can walk up.  Pt also refused to perform incentive spirometry during session.  PTA educated on the importance of using Incentive spirometer and patient remains to decline.  Plan next session to focus on gait with energy conservation to improve function at home.   Follow Up Recommendations  No PT follow up     Equipment Recommendations  None recommended by PT    Recommendations for Other Services       Precautions / Restrictions Precautions Precautions: Fall Restrictions Weight Bearing Restrictions: No    Mobility  Bed Mobility               General bed mobility comments: Sitting in recliner on arrival.    Transfers Overall transfer level: Needs assistance   Transfers: Sit to/from Stand Sit to Stand: Supervision         General transfer comment: Pt performed from recliner chair.  Mild LOB ascending to standing but able to right and correct without assistance.    Ambulation/Gait Ambulation/Gait assistance: Supervision Ambulation Distance (Feet): 120 Feet Assistive device: None (pushing portable O2.  ) Gait Pattern/deviations: Step-through pattern     General Gait Details: steady and functional speed with increasing dyspnea.  sats on 6L maintained 90%-94% HR 88bpm.     Stairs            Wheelchair Mobility    Modified Rankin (Stroke Patients Only)        Balance     Sitting balance-Leahy Scale: Good       Standing balance-Leahy Scale: Fair                              Cognition Arousal/Alertness: Awake/alert Behavior During Therapy: WFL for tasks assessed/performed Overall Cognitive Status: Within Functional Limits for tasks assessed                                        Exercises      General Comments        Pertinent Vitals/Pain Pain Assessment: No/denies pain    Home Living                      Prior Function            PT Goals (current goals can now be found in the care plan section) Acute Rehab PT Goals Potential to Achieve Goals: Good Progress towards PT goals: Progressing toward goals    Frequency    Min 3X/week      PT Plan Current plan remains appropriate    Co-evaluation             End of Session Equipment Utilized During Treatment: Oxygen;Gait belt Activity Tolerance: Patient tolerated treatment  well Patient left: in chair;with call bell/phone within reach Nurse Communication: Mobility status PT Visit Diagnosis: Other abnormalities of gait and mobility (R26.89)     Time: 9987-2158 PT Time Calculation (min) (ACUTE ONLY): 16 min  Charges:  $Gait Training: 8-22 mins                    G Codes:       Governor Rooks, PTA pager [phone]    Cristela Blue 04/13/2016, 7:08 PM

## 2016-04-13 NOTE — Progress Notes (Signed)
   04/13/16 0022  BiPAP/CPAP/SIPAP  BiPAP/CPAP/SIPAP Pt Type Adult  Mask Type Nasal mask  Respiratory Rate 20 breaths/min  Flow Rate 5 lpm  BiPAP/CPAP/SIPAP CPAP  Patient Home Equipment No  Auto Titrate Yes (5cmH2O-20cmH2O)  BiPAP/CPAP /SiPAP Vitals  Bilateral Breath Sounds Diminished  pt tolerating above settings at this time.

## 2016-04-14 LAB — BASIC METABOLIC PANEL
Anion gap: 7 (ref 5–15)
BUN: 27 mg/dL — ABNORMAL HIGH (ref 6–20)
CO2: 40 mmol/L — ABNORMAL HIGH (ref 22–32)
Calcium: 9.7 mg/dL (ref 8.9–10.3)
Chloride: 95 mmol/L — ABNORMAL LOW (ref 101–111)
Creatinine, Ser: 1.26 mg/dL — ABNORMAL HIGH (ref 0.44–1.00)
GFR calc Af Amer: 54 mL/min — ABNORMAL LOW (ref >60)
GFR calc non Af Amer: 47 mL/min — ABNORMAL LOW (ref >60)
Glucose, Bld: 105 mg/dL — ABNORMAL HIGH (ref 65–99)
Potassium: 4.3 mmol/L (ref 3.5–5.1)
Sodium: 142 mmol/L (ref 135–145)

## 2016-04-14 LAB — CBC
HCT: 36.8 % (ref 36.0–46.0)
Hemoglobin: 11 g/dL — ABNORMAL LOW (ref 12.0–15.0)
MCH: 27.7 pg (ref 26.0–34.0)
MCHC: 29.9 g/dL — ABNORMAL LOW (ref 30.0–36.0)
MCV: 92.7 fL (ref 78.0–100.0)
Platelets: 400 10*3/uL (ref 150–400)
RBC: 3.97 MIL/uL (ref 3.87–5.11)
RDW: 16 % — ABNORMAL HIGH (ref 11.5–15.5)
WBC: 15.1 10*3/uL — ABNORMAL HIGH (ref 4.0–10.5)

## 2016-04-14 LAB — BRAIN NATRIURETIC PEPTIDE: B Natriuretic Peptide: 26.2 pg/mL (ref 0.0–100.0)

## 2016-04-14 LAB — GLUCOSE, CAPILLARY: Glucose-Capillary: 128 mg/dL — ABNORMAL HIGH (ref 65–99)

## 2016-04-14 MED ORDER — POLYETHYLENE GLYCOL 3350 17 G PO PACK: 17.0000 g | PACK | Freq: Every day | ORAL | Status: DC | PRN

## 2016-04-14 NOTE — Progress Notes (Signed)
RT placed patient on BIPAP with 6L O2 bled into circuit. Patient tolerating well at this time. RT will monitor as needed.

## 2016-04-14 NOTE — Progress Notes (Signed)
Family Medicine Teaching Service Daily Progress Note Intern Pager: [phone]  Patient name: Tiffany [name] Medical record number: [id number] Date of birth: 1960-08-09 Age: 56 y.o. Gender: female  Primary Care Provider: Kathrine Cords, MD Consultants: none Code Status: FULL  Pt Overview and Major Events to Date:  3/26 admitted with SOB, likely CHF exacerbation 3/27 pulm consulted for likely PHTN  Assessment and Plan: LITHZY BERNARD is a 56 y.o. female presenting with shortness of breath and 6 lb weight gain in 2 days. PMH is significant for HFpEF (echo 04/04/16 with EF 60-65% and G2DD, home lasix is 80mg  BID), obesity, T2DM, HLD, OSA, asthma, substance abuse (tobacco, hx of cocaine), bipolar disorder, COPD (home O2 is 6L), HTN.   Shortness of breath with history of COPD and HFpE, ongoing:  Multifactorial with combination of pulm HTN, obesity, poor compliance with CPAP, CHF exacerbation and COPD. Seen by pulm who recommended aggressive diuresis with 80mg  IV lasix BID, CPAP compliance and autoimmune workup.  No targeted drugs for pulm HTN.   - per Pulm will continue with aggressive diuresis, changed to lasix 160mg  BID and added metolazone 3/29, continue 3/30 -continue home Spiriva Breo -scheduled duonebs every 6 hours - continued PO prednisone  - albuterol nebs PRN -strict I/Os -daily weights  Acute on Chronic Kidney Disease: Baseline per chart review appears to be around 1, however Cr has been 1.2-1.5 over the last few months. Ct elevated to 1.26 currently.  - cont to hold home oxybutynin, and Lisinopril  - daily BMP  Constipation, resolved: Stooling daily x 2 days.  -senokot-s 2 tablets bid -miralax daily PRN  T2DM: A1C 5.8 this admission. Home med is metformin 500 twice a day. CBGs WNL.  -CBG monitoring daily.  - continue home gabapentin  History of drug abuse: patient reports last cocaine use November 2017. UDS with narcotics, patient denies use and none rx'd in database.  Likely contributing to constipation as above. -monitor for withdrawal symptoms  Bipolar 1 disorder/Schizophernia. At home on Remeron 30 mg daily, Geodon 160 mgand Lexapro. Follows with psychiatry. Mood stable on my exam. -Continue Lexapro 20 mg po daily -Continue Geodon 80 mg nightly -Continue home Cogentin 0.5 mg daily  -Continue home [name] 500 mg nightly -Continue home Remeron 30 mg nightly  Urge incontinence: Home med is oxybutynin. Will hold this given up trending creatinine as an outpatient and unclear voiding history. -Hold home oxybutynin -Monitor intake and output  HTN: Normotensive, continue current regimen. -Hold home verapamil, patient is on 2 calcium channel blockers in PCP was attempting to stop this -Continue Norvasc. May need to increase this dose since we are stopping Verapamil. -Hold home lisinopril in the setting of possible AKI -Monitor blood pressure  HLD. Last lipid panel less than average risk 11/2015, continue home statin. -Continue Lipitor 40 mg daily  OSA: States she sometimes uses CPAP at home and sometimes does not. -CPAP while hospitalized - Pulm recommending better compliance   Tobacco use: Patient currently smokes 1.5 packs per day. -Nicotine patch while hospitalized.  FEN/GI: Regular diet per patient preference Prophylaxis: Heparin  Disposition: continued inpatient management of SOB  Subjective:  Patient sleeping on my exam, arousable. RT adjusting BiPAP that was malaligned overnight.  Objective: Temp:  [97.5 F (36.4 C)-98.3 F (36.8 C)] 97.5 F (36.4 C) (03/30 0616) Pulse Rate:  [76-94] 76 (03/30 0616) Resp:  [18] 18 (03/30 0616) BP: (111-173)/(59-94) 173/94 (03/30 0616) SpO2:  [92 %-99 %] 99 % (03/30 0616) Weight:  [278 lb  8 oz (126.3 kg)-282 lb 6.6 oz (128.1 kg)] 282 lb 6.6 oz (128.1 kg) (03/30 4461) Physical Exam: General: Obese female resting in chair in NAD. Cardiovascular: RRR, no murmur Respiratory: diffuse  wheezing, mild bibasilar crackles, easy WOB on BiPAP Abdomen: soft, distended, nontender, +BS Extremities: 1+ pitting edema to knees bilaterally, WWP  Laboratory:  Recent Labs Lab 04/12/16 0938 04/13/16 0649 04/14/16 0529  WBC 8.8 13.8* 15.1*  HGB 11.7* 12.1 11.0*  HCT 40.1 40.7 36.8  PLT 440* 438* 400    Recent Labs Lab 04/10/16 1650  04/12/16 0938 04/13/16 0649 04/14/16 0529  NA 145  < > 146* 143 142  K 3.8  < > 4.5 4.9 4.3  CL 96*  < > 96* 97* 95*  CO2 38*  < > 42* 37* 40*  BUN 24*  < > 27* 25* 27*  CREATININE 1.44*  < > 1.37* 1.31* 1.26*  CALCIUM 10.0  < > 8.8* 9.5 9.7  PROT 6.1*  --   --   --   --   BILITOT 0.4  --   --   --   --   ALKPHOS 95  --   --   --   --   ALT 18  --   --   --   --   AST 20  --   --   --   --   GLUCOSE 90  < > 100* 141* 105*  < > = values in this interval not displayed. UDS + opiates Trop 0.03, <0.03, <0.03  Imaging/Diagnostic Tests: No results found.  Sela Hilding, MD 04/14/2016, 6:53 AM PGY-1, [place] Intern pager: [phone], text pages welcome

## 2016-04-14 NOTE — Progress Notes (Signed)
Occupational Therapy Treatment Patient Details Name: Tiffany [name] MRN: [id number] DOB: Jan 14, 1961 Today's Date: 04/14/2016    History of present illness  56 yo F with longstanding pulmonary and cardiac issues including HFpEF, OSA, COPD on 6L home O2, HTN, DM2 who presents with about a weeks worth of increasing dyspnea.   OT comments  Pt able to complete functional mobility in room with supervision today. Simulated tub transfer and pt would require min assist at this time; pt reports she does have appropriate assist for this at home. Educated pt on energy conservation and breathing strategies. SpO2=95% on 6L supplemental O2 with activity. D/c plan remains appropriate. Will continue to follow acutely.   Follow Up Recommendations  No OT follow up;Supervision - Intermittent    Equipment Recommendations  None recommended by OT    Recommendations for Other Services      Precautions / Restrictions Precautions Precautions: Fall Precaution Comments: watch O2 sats Restrictions Weight Bearing Restrictions: No       Mobility Bed Mobility               General bed mobility comments: Pt OOB in chair upon arrival.  Transfers Overall transfer level: Needs assistance Equipment used: None Transfers: Sit to/from Stand Sit to Stand: Supervision         General transfer comment: For safety. No unsteadiness or LOB noted.    Balance Overall balance assessment: Needs assistance   Sitting balance-Leahy Scale: Good       Standing balance-Leahy Scale: Good                             ADL either performed or assessed with clinical judgement   ADL Overall ADL's : Needs assistance/impaired                         Toilet Transfer: Supervision/safety;Ambulation       Tub/ Shower Transfer: Minimal assistance;Ambulation;Tub transfer Tub/Shower Transfer Details (indicate cue type and reason): Simulated tub transfer in room. Pt with difficulty clearing side  of tub, reports someone assists with lifting her leg over edge of tub at home. Functional mobility during ADLs: Supervision/safety General ADL Comments: Educated pt on breathing strategies and energy conservation techniques. SpO2=95% on 6L O2 with activity.     Vision       Perception     Praxis      Cognition Arousal/Alertness: Awake/alert Behavior During Therapy: WFL for tasks assessed/performed Overall Cognitive Status: Within Functional Limits for tasks assessed                                          Exercises     Shoulder Instructions       General Comments      Pertinent Vitals/ Pain       Pain Assessment: No/denies pain  Home Living                                          Prior Functioning/Environment              Frequency  Min 2X/week        Progress Toward Goals  OT Goals(current goals can now be found in the care plan  section)  Progress towards OT goals: Progressing toward goals  Acute Rehab OT Goals Patient Stated Goal: home independent OT Goal Formulation: With patient  Plan Discharge plan remains appropriate    Co-evaluation                 End of Session Equipment Utilized During Treatment: Oxygen  OT Visit Diagnosis: Unsteadiness on feet (R26.81)   Activity Tolerance Patient tolerated treatment well   Patient Left in chair;with call bell/phone within reach;Other (comment) (RT in room)   Nurse Communication          Time: [phone] OT Time Calculation (min): 11 min  Charges: OT General Charges $OT Visit: 1 Procedure OT Treatments $Self Care/Home Management : 8-22 mins  Morrisa Aldaba A. Ulice Brilliant, M.S., OTR/L Pager: [place] 04/14/2016, 3:14 PM

## 2016-04-14 NOTE — Care Management Important Message (Signed)
Important Message  Patient Details  Name: Tiffany [name] MRN: [id number] Date of Birth: Jul 10, 1960   Medicare Important Message Given:  Yes    Nathen May 04/14/2016, 10:55 AM

## 2016-04-15 LAB — BASIC METABOLIC PANEL
Anion gap: 10 (ref 5–15)
BUN: 31 mg/dL — ABNORMAL HIGH (ref 6–20)
CO2: 38 mmol/L — ABNORMAL HIGH (ref 22–32)
Calcium: 9.5 mg/dL (ref 8.9–10.3)
Chloride: 91 mmol/L — ABNORMAL LOW (ref 101–111)
Creatinine, Ser: 1.46 mg/dL — ABNORMAL HIGH (ref 0.44–1.00)
GFR calc Af Amer: 45 mL/min — ABNORMAL LOW (ref >60)
GFR calc non Af Amer: 39 mL/min — ABNORMAL LOW (ref >60)
Glucose, Bld: 85 mg/dL (ref 65–99)
Potassium: 3.6 mmol/L (ref 3.5–5.1)
Sodium: 139 mmol/L (ref 135–145)

## 2016-04-15 LAB — GLUCOSE, CAPILLARY: Glucose-Capillary: 84 mg/dL (ref 65–99)

## 2016-04-15 MED ORDER — FUROSEMIDE 40 MG PO TABS: 80.0000 mg | ORAL_TABLET | Freq: Two times a day (BID) | ORAL | 0 refills | Status: DC

## 2016-04-15 MED ORDER — IPRATROPIUM-ALBUTEROL 0.5-2.5 (3) MG/3ML IN SOLN
3.0000 mL | Freq: Three times a day (TID) | RESPIRATORY_TRACT | Status: DC
  Administered 2016-04-15: 3 mL via RESPIRATORY_TRACT
  Filled 2016-04-15: qty 3

## 2016-04-15 MED ORDER — PREDNISONE 50 MG PO TABS: 50.0000 mg | ORAL_TABLET | Freq: Every day | ORAL | 0 refills | Status: DC

## 2016-04-15 MED ORDER — AMLODIPINE BESYLATE 5 MG PO TABS: 10.0000 mg | ORAL_TABLET | Freq: Every day | ORAL | 0 refills | Status: DC

## 2016-04-15 MED ORDER — FUROSEMIDE 80 MG PO TABS
80.0000 mg | ORAL_TABLET | Freq: Two times a day (BID) | ORAL | Status: DC
  Administered 2016-04-15: 80 mg via ORAL
  Filled 2016-04-15: qty 1

## 2016-04-15 MED ORDER — METOLAZONE 2.5 MG PO TABS
2.5000 mg | ORAL_TABLET | Freq: Every day | ORAL | 0 refills | Status: DC
Start: 2016-04-16 — End: 2016-04-24

## 2016-04-15 NOTE — Progress Notes (Signed)
Family Medicine Teaching Service Daily Progress Note Intern Pager: [phone]  Patient name: Tiffany [name] Medical record number: [id number] Date of birth: 09-10-1960 Age: 56 y.o. Gender: female  Primary Care Provider: Kathrine Cords, MD Consultants: none Code Status: FULL  Pt Overview and Major Events to Date:  3/26 admitted with SOB, likely CHF exacerbation 3/27 pulm consulted for likely PHTN  Assessment and Plan: Tiffany [name] is a 56 y.o. female presenting with shortness of breath and 6 lb weight gain in 2 days. PMH is significant for HFpEF (echo 04/04/16 with EF 60-65% and G2DD, home lasix is 80mg  BID), obesity, T2DM, HLD, OSA, asthma, substance abuse (tobacco, hx of cocaine), bipolar disorder, COPD (home O2 is 6L), HTN.   Shortness of breath with history of COPD and HFpE, ongoing:  Multifactorial with combination of pulm HTN, obesity, poor compliance with CPAP, CHF exacerbation and COPD. Seen by pulm who recommended aggressive diuresis with 80mg  IV lasix BID, CPAP compliance and autoimmune workup.  No targeted drugs for pulm HTN.   - transition to PO diuresis: lasix 80mg  BID and daily metolazone -continue home Spiriva Breo -scheduled duonebs every 6 hours - continued PO prednisone  - albuterol nebs PRN -strict I/Os -daily weights  Acute on Chronic Kidney Disease: Baseline per chart review appears to be around 1, however Cr has been 1.2-1.5 over the last few months. Ct elevated to 1.26 currently.  - cont to hold home oxybutynin, and Lisinopril  - daily BMP  Constipation, resolved: Stooling daily x 2 days.  -senokot-s 2 tablets bid -miralax daily PRN  T2DM: A1C 5.8 this admission. Home med is metformin 500 twice a day. CBGs WNL.  -CBG monitoring daily.  - continue home gabapentin  History of drug abuse: patient reports last cocaine use November 2017. UDS with narcotics, patient denies use and none rx'd in database. Likely contributing to constipation as above. -monitor  for withdrawal symptoms  Bipolar 1 disorder/Schizophernia. At home on Remeron 30 mg daily, Geodon 160 mgand Lexapro. Follows with psychiatry. Mood stable on my exam. -Continue Lexapro 20 mg po daily -Continue Geodon 80 mg nightly -Continue home Cogentin 0.5 mg daily  -Continue home [name] 500 mg nightly -Continue home Remeron 30 mg nightly  Urge incontinence: Home med is oxybutynin. Will hold this given up trending creatinine as an outpatient and unclear voiding history. -Hold home oxybutynin -Monitor intake and output  HTN: Normotensive, continue current regimen. -Hold home verapamil, patient is on 2 calcium channel blockers in PCP was attempting to stop this -Continue Norvasc. May need to increase this dose since we are stopping Verapamil. -Hold home lisinopril in the setting of possible AKI -Monitor blood pressure  HLD. Last lipid panel less than average risk 11/2015, continue home statin. -Continue Lipitor 40 mg daily  OSA: States she sometimes uses CPAP at home and sometimes does not. -CPAP while hospitalized - Pulm recommending better compliance   Tobacco use: Patient currently smokes 1.5 packs per day. -Nicotine patch while hospitalized.  FEN/GI: Regular diet per patient preference Prophylaxis: Heparin  Disposition: continued inpatient management of SOB  Subjective:  Patient repeatedly asking to go home today. Feels her SOB is resolved.   Objective: Temp:  [98.1 F (36.7 C)-98.8 F (37.1 C)] 98.1 F (36.7 C) (03/31 0514) Pulse Rate:  [69-92] 69 (03/31 0514) Resp:  [18-20] 20 (03/31 0514) BP: (134-142)/(77-82) 136/81 (03/31 0514) SpO2:  [95 %-99 %] 96 % (03/31 0834) Weight:  [281 lb 6.1 oz (127.6 kg)] 281 lb 6.1 oz (  127.6 kg) (03/31 0505) Physical Exam: General: Obese female eating breakfast on EOB in NAD. Cardiovascular: RRR, no murmur Respiratory: diffuse wheezing, mild bibasilar crackles, easy WOB on 6L [place] Abdomen: soft, distended, nontender,  +BS Extremities: trace pitting edema to knees bilaterally, WWP  Laboratory:  Recent Labs Lab 04/12/16 0938 04/13/16 0649 04/14/16 0529  WBC 8.8 13.8* 15.1*  HGB 11.7* 12.1 11.0*  HCT 40.1 40.7 36.8  PLT 440* 438* 400    Recent Labs Lab 04/10/16 1650  04/12/16 0938 04/13/16 0649 04/14/16 0529  NA 145  < > 146* 143 142  K 3.8  < > 4.5 4.9 4.3  CL 96*  < > 96* 97* 95*  CO2 38*  < > 42* 37* 40*  BUN 24*  < > 27* 25* 27*  CREATININE 1.44*  < > 1.37* 1.31* 1.26*  CALCIUM 10.0  < > 8.8* 9.5 9.7  PROT 6.1*  --   --   --   --   BILITOT 0.4  --   --   --   --   ALKPHOS 95  --   --   --   --   ALT 18  --   --   --   --   AST 20  --   --   --   --   GLUCOSE 90  < > 100* 141* 105*  < > = values in this interval not displayed. UDS + opiates Trop 0.03, <0.03, <0.03  Imaging/Diagnostic Tests: No results found.  Sela Hilding, MD 04/15/2016, 9:01 AM PGY-1, [place] Intern pager: [phone], text pages welcome

## 2016-04-15 NOTE — Discharge Instructions (Signed)
You were hospitalized because we thought you were having a worsening of your congestive heart failure. Your symptoms got better when we gave you IV medications to help you urinate.  Please take Lasix 2 tablets (80mg ) twice a day. Your doctor may adjust this dose in the future. We have also started a new medication called Metolazone, which will also help you keep fluid from building up. Please take Metolazone 2.5mg  daily.  You should take Prednisone 1 tablet daily for 2 more days (starting tomorrow). We have sent in a prescription for this into your pharmacy.  You should STOP taking the Verapamil, Lisinopril, and Oxybutynin. Your doctor may restart some of these medications in the future.

## 2016-04-15 NOTE — Progress Notes (Addendum)
CALL PAGER [phone] for any questions or notifications regarding this patient  FMTS Attending Note: Tiffany Mcmurray MD She has CKD stage 3 as evidenced by her GFR ranging  42-48. She has BMI > 47 and is morbidly obese.

## 2016-04-15 NOTE — Care Management Note (Signed)
Case Management Note  Patient Details  Name: Tiffany [name] MRN: [id number] Date of Birth: 05/22/1960  Subjective/Objective:                  Spoke with [facility], requested O2 tank for discharge be brought to room. Also notified of referral for supplies for CPAP machine at home. [facility] will follow up with patient for parts for CPAP.   Action/Plan:   Expected Discharge Date:  04/15/16               Expected Discharge Plan:  Home/Self Care  In-House Referral:     Discharge planning Services  CM Consult  Post Acute Care Choice:  Durable Medical Equipment Choice offered to:     DME Arranged:    DME Agency:  [place]:    [facility] Agency:     Status of Service:  Completed, signed off  If discussed at [place] of Stay Meetings, dates discussed:    Additional Comments:  Carles Collet, RN 04/15/2016, 3:23 PM

## 2016-04-15 NOTE — Progress Notes (Signed)
NURSING PROGRESS NOTE  [facility] [id number] Discharge Data: 04/15/2016 3:55 PM Attending Provider: Dickie La, MD YJW:[id number] Lorenso Courier, MD   [name] to be D/C'd Home per MD order.    All IV's will be discontinued and monitored for bleeding.  All belongings will be returned to patient for patient to take home.  Last Documented Vital Signs:  Blood pressure 136/81, pulse 69, temperature 98.1 F (36.7 C), resp. rate 20, height 5\' 4"  (1.626 m), weight 127.6 kg (281 lb 6.1 oz), SpO2 98 %.  Joslyn Hy, MSN, RN, [employer]

## 2016-04-17 ENCOUNTER — Other Ambulatory Visit: Payer: Self-pay

## 2016-04-17 ENCOUNTER — Telehealth: Payer: Self-pay | Admitting: *Deleted

## 2016-04-17 ENCOUNTER — Telehealth: Payer: Self-pay | Admitting: Family Medicine

## 2016-04-17 NOTE — Telephone Encounter (Signed)
HFU reminder. Patient confirmed appointment.

## 2016-04-17 NOTE — Telephone Encounter (Signed)
Patient notes muscle cramps and aching in her body everywhere.  She tried taking Tylenol without improvement. She notes she was taking Tylenol in the hospital but states in the outpatient setting it is not helping.   She states she's strongly considering taking Naproxen (which we previously d/c'd due to worsening renal function).   She states Flexeril doesn't help either and does not wish to try this.   Given her history of substance abuse, I do not feel the patient is a good candidate for controlled substances like opioids (especially as Naproxen has helped with the pain in the past).  Discussed if she intermittently needs to take 1 Naproxen it would be okay, but this should not be a weekly thing.   Patient currently seeing a psychiatrist for bipolar disorder. I am curious if something like Cymbalta would help for her diffuse pain. Will send him a message, as I would not want to change any of her regimen on my own given her prominent psychiatric history requiring multiple hospitalizations.   Archie Patten, MD [facility] Family Medicine Resident  04/17/2016, 11:19 AM

## 2016-04-17 NOTE — Telephone Encounter (Signed)
Pt is calling and would like to speak to one of the nurse's for Dr. Lorenso Courier or the doctor. [name] in pain all over her body and she usually takes Naproxen, but she was told not to take this anymore. She either needs something else she can take for the pain or she wants to take the Naproxen again because she can not take the pain she is having. jw

## 2016-04-18 ENCOUNTER — Other Ambulatory Visit: Payer: Self-pay | Admitting: Pharmacist

## 2016-04-18 ENCOUNTER — Encounter: Payer: Self-pay | Admitting: *Deleted

## 2016-04-18 NOTE — Patient Outreach (Signed)
[facility]) Care Management  04/18/2016  Tiffany [name] [birth year] [id number]  Called patient regarding medication management per referral from [place], [name].  HIPAA identifiers were obtained.  A pharmacy home visit was scheduled for Wednesday April 19, 2016 at 10:00am.  Elayne Guerin, PharmD, [place] Clinical Pharmacist [phone]

## 2016-04-18 NOTE — Addendum Note (Signed)
Addended by: [name] R on: 04/18/2016 02:55 AM   Modules accepted: Orders

## 2016-04-18 NOTE — Patient Outreach (Addendum)
[facility] [place] [facility]) Care Management  04/18/16  Tiffany [name] 1960-01-26 [id number]  RNCM received referral to outreach patient following hospitalization 04/10/16 - 04/15/2016. Per records review, patient was admitted with shortness of breath and weight gain x 2 days prior to admission. Patient was discharged on lasix 80 mg twice a day and metolazone.   Successful outreach completed with patient for transition of care. RNCM provided education about [place] and she was knowledgeable and agreeable to outreach. She requested to have Tiffany [name], [place] Management Coordinator resume services if possible and RNCM advised will check with Brayton Layman to discuss her caseload and will transfer if possible to her.   Subjective: Patient stated that she is doing "ok." She stated that she has been having some pain since discharge and that she was supposed to take Tylenol. She reported that this has not been helping her pain, so she called her doctor to see if she could take naproxen, as this has helped her in the past. She stated that she has been taking naproxen and her pain is resolved. She currently denies any pain. Patient reported that she has taken 2 Naproxen since talking with her doctor.   Objective: Per review of record, patient did contact PCP to discuss use of Naproxen and she was told that she could resume it intermittently due to history of worsening renal function. It was discussed that "if she needs to take 1 Naproxen it would be ok, but this should not be a weekly thing."   Assessment: Patient verbalized understanding of discharge instructions and was able to verbalize why she was in the hospital. She stated that she was having trouble breathing and had fluid on her lungs. She stated that she has a follow up appointment scheduled with her PCP this week and admitted that she does have transportation for her appointments. RNCM reviewed patient's medications with her and she  was able to verbalize what she was taking. She confirms she has all of her medications she is supposed to be taking, but is worried she is confused about them. She stated that she has been taking her lasix 3 times per day based on what the nurse showed her on her discharge paperwork. Several times during the discussion, RNCM and patient discussed the Lasix and RNCM assessed what times the patient was taking her Lasix. She reported that she takes it at 8:30 am and 5:30 pm and never admitted to a 3rd time. RNCM encouraged patient to follow discharge instructions - to make sure she is only taking it twice a day. RNCM educated patient regarding [place] services and she was open to having pharmacist outreach to review her medications and help her adherence and management of her medications. Also educated about heart failure. Patient stated that she is weighing daily at bedtime, but is not documenting it. She stated, "I can remember it in my head." She stated that she is weighing at night because that is "that is when the nurses were weighing me in the hospital." RNCM provided education about the importance of monitoring daily weights, first thing in the morning after going to the bathroom and before eating or drinking anything. Patient seemed confused about this since she was convinced the she was not being weighed in the morning in the hospital setting. Encouraged patient to weigh daily in the morning and to write it down so she can watch for weight gain of 3 lbs in one day or 5 lbs in  a week. Patient verbalized understanding of when to call her doctor.  Patient is currently on home O2 at 6 lpm. She stated that she continues to be some short of breath and this increases with walking and activity. She stated that she normally sleeps on multiple pillows to assist with her breathing. Patient reported that she does use her rescue inhalers and has used it several times since discharge. She continues to complain of a  productive cough and reported that her sputum is black in color. Patient has audible wheezes heard over the phone. RNCM educated patient on signs and symptoms of when to call her doctor and she verbalized understanding.   Patient also stated that she does not have any advance directives, but is interested in completing them. She stated that she needs more assistance and information about them. Patient is agreeable to a SW referral to assist with completion of advance directives.   Patient currently denies smoking. She reported that she quit on the day of her admission (04/10/2016) and has not resumed smoking since discharge home. She stated that she knows it is making it more difficult for her to breathe and that each hospitalization is harder and harder for her to recover from. She denies any second-hand smoke, stating that everyone around her had also quit. She stated that she is struggling but does not plan to resume for her health.  As noted above, patient currently denies pain since taking Naproxen.   Patient currently denies any specific needs. RNCM provided contact information and also number for 24 hour nurse line. Encouraged to call with any questions or concerns.   Plan: Home visit scheduled for next week. However, RNCM advised that if Tiffany [name] is able assume patient's case, she may call to reschedule depending on her schedule. Patient was agreeable.  UPDATE: RNCM spoke with Tiffany [name] who is agreeable to assume patient's case. Advised of patient's concerns about medications (and pharmacy referral) and SW referral for advance directives. Advised of scheduled home visit next week.   Tiffany R. Zachory Mangual, RN, BSN, [place] Management Coordinator [phone]

## 2016-04-19 ENCOUNTER — Ambulatory Visit: Payer: Self-pay | Admitting: Pharmacist

## 2016-04-19 ENCOUNTER — Encounter: Payer: Self-pay | Admitting: Pharmacist

## 2016-04-19 ENCOUNTER — Encounter: Payer: Self-pay | Admitting: Family Medicine

## 2016-04-19 ENCOUNTER — Other Ambulatory Visit: Payer: Self-pay | Admitting: Pharmacist

## 2016-04-19 ENCOUNTER — Ambulatory Visit (INDEPENDENT_AMBULATORY_CARE_PROVIDER_SITE_OTHER): Payer: Medicare HMO | Admitting: Family Medicine

## 2016-04-19 VITALS — BP 102/58 | HR 91 | Temp 98.8°F | Ht 64.0 in | Wt 277.0 lb

## 2016-04-19 DIAGNOSIS — I10 Essential (primary) hypertension: Secondary | ICD-10-CM | POA: Diagnosis not present

## 2016-04-19 DIAGNOSIS — I5030 Unspecified diastolic (congestive) heart failure: Secondary | ICD-10-CM | POA: Diagnosis not present

## 2016-04-19 DIAGNOSIS — F17200 Nicotine dependence, unspecified, uncomplicated: Secondary | ICD-10-CM | POA: Diagnosis not present

## 2016-04-19 DIAGNOSIS — J449 Chronic obstructive pulmonary disease, unspecified: Secondary | ICD-10-CM | POA: Diagnosis not present

## 2016-04-19 DIAGNOSIS — N179 Acute kidney failure, unspecified: Secondary | ICD-10-CM

## 2016-04-19 NOTE — Assessment & Plan Note (Signed)
-  Patient states breathing much easier.  Lung exam clear with no crackles appreciated.  Mild nonpitting edema bilateral LE.   -Diuresing well, continue current regimen of 80 lasix po BID and metolazone 2.5 mg daily.  -Will reassess fluid status at next visit -BMET today to check Cr

## 2016-04-19 NOTE — Assessment & Plan Note (Signed)
-  Counseled about dangers of smoking while on oxygen, patient reports she has quit ever since date of admission 04/10/2016.

## 2016-04-19 NOTE — Patient Instructions (Addendum)
It was nice seeing you in clinic today.  You were seen for a hospital follow up visit and are overall improving.  I would like for you to continue the Lasix 80 mg twice a day as well as the Metolazone as I think these are a good dose for you to be on.  In addition, we had held your Lisinopril and Oxybutynin on discharge due to your kidney function.  I have ordered some bloodwork to look at your kidney function today.  I would like you to hold off on resuming these medications until we have the results of your bloodwork. As we discussed, your blood pressure today was 102/58 so it would not be advisable to start your Lisinopril anyways.  Please follow up in clinic and call with any questions.   Be well,  Lovenia Kim, MD

## 2016-04-19 NOTE — Assessment & Plan Note (Signed)
Lisinopril held on discharge as resolving AKI.   -Blood pressure today 102/58 so will not restart.   Of note, patient is overly concerned about not restarting her blood pressure medications.  Discussed with her that we will wait until a later visit given her BP today was low.  -Verapamil was discontinued on discharge as she was on 2 CCBs.  -Monitor BP at next visit and restart home Lisinopril if appropriate

## 2016-04-19 NOTE — Assessment & Plan Note (Signed)
Resolving.  -Baseline unclear ~1-1.2.  During hospitalization with Cr to 1.57.  Lisinooril and Oxybutynin were held on discharge in setting of acute kidney injury.   -Restart home medications  as necessary

## 2016-04-19 NOTE — Patient Outreach (Addendum)
[place] [facility]) Care Management  [place]   04/19/2016  Tiffany [name] 1960-06-13 [id number]  Subjective: Home visit completed in patients home along with [place], [place]. HIPAA identifiers were obtained. Patient is a 56 year old female referred for medication management by [facility] Nurse, [name].  Patient has multiple medical conditions including but not limited to:  CHF, hypertension, COPD, type 2 diabetes, hyperlipidemia, pulmonary hypertension,  history of tobacco and narcotic abuse.  Patient was hospitalized 04/10/16-04/15/16 for CHF exacerbation.  Patient was seen by her PCP earlier today for post hospital follow up visit.  Patient manages her medications on her own. When asked what her medications were for, she was accurate in her responses. Patient was also knowledgeable about her medication regimen.  She uses a pill box and reported filling it herself weekly.  She asked for assistance with filling her pill box during the home visit as some of her medications were discontinued at discharge and she was confused on which medications to remove.   Objective:   Encounter Medications: Outpatient Encounter Prescriptions as of 04/19/2016  Medication Sig Note  . albuterol (PROVENTIL HFA;VENTOLIN HFA) 108 (90 Base) MCG/ACT inhaler Inhale 2 puffs into the lungs every 6 (six) hours as needed.   [name] albuterol (PROVENTIL) (2.5 MG/3ML) 0.083% nebulizer solution Take 3 mLs (2.5 mg total) by nebulization every 6 (six) hours as needed for wheezing or shortness of breath.   . Alcohol Swabs (B-D SINGLE USE SWABS REGULAR) PADS CHECK CAPILLARY BLOOD GLUCOSE ONE TIME DAILY   . amLODipine (NORVASC) 5 MG tablet Take 2 tablets (10 mg total) by mouth daily.   [name] aspirin 81 MG EC tablet Take 1 tablet (81 mg total) by mouth daily. Swallow whole.   [name] atorvastatin (LIPITOR) 40 MG tablet Take 1 tablet (40 mg total) by mouth daily.   . benztropine (COGENTIN) 0.5 MG tablet  Take 1 tablet (0.5 mg total) by mouth daily.   . Blood Glucose Monitoring Suppl (ACCU-CHEK AVIVA PLUS) w/Device KIT CHECK BLOOD SUGAR THREE TIMES DAILY   . BREO ELLIPTA 100-25 MCG/INH AEPB INHALE 1 PUFF EVERY DAY   . divalproex ([name] ER) 500 MG 24 hr tablet Take 1 tablet (500 mg total) by mouth daily. (Patient taking differently: Take 500 mg by mouth at bedtime. ) 04/19/2016: Patient had 272m tablets in her posession  . escitalopram (LEXAPRO) 20 MG tablet Take 1 tablet (20 mg total) by mouth daily.   . furosemide (LASIX) 40 MG tablet Take 2 tablets (80 mg total) by mouth 2 (two) times daily.   .Marland Kitchengabapentin (NEURONTIN) 600 MG tablet Take 1 tablet (600 mg total) by mouth 3 (three) times daily.   .Marland Kitchenglucose blood test strip Use to check blood sugar 3 times daily.   . metFORMIN (GLUCOPHAGE-XR) 500 MG 24 hr tablet TAKE 1 TABLET TWICE DAILY   . metolazone (ZAROXOLYN) 2.5 MG tablet Take 1 tablet (2.5 mg total) by mouth daily.   . mirtazapine (REMERON) 30 MG tablet Take 1 tablet (30 mg total) by mouth at bedtime.   . montelukast (SINGULAIR) 10 MG tablet Take 1 tablet (10 mg total) by mouth at bedtime.   . Multiple Vitamins-Minerals (MULTIVITAMIN WITH MINERALS) tablet Take 1 tablet by mouth daily. Reported on 07/28/2015   . naproxen (NAPROSYN) 500 MG tablet Take 500 mg by mouth 2 (two) times daily with a meal.   . omeprazole (PRILOSEC) 40 MG capsule Take 1 capsule (40 mg total) by mouth  daily. (Patient taking differently: Take 40 mg by mouth 2 (two) times daily. )   . potassium chloride (K-DUR,KLOR-CON) 10 MEQ tablet Take 2 tablets by mouth daily.   [name] tiotropium (SPIRIVA HANDIHALER) 18 MCG inhalation capsule Place 1 capsule (18 mcg total) into inhaler and inhale daily.   . ziprasidone (GEODON) 80 MG capsule Take 1 capsule (80 mg total) by mouth at bedtime.   [name] acetaminophen (TYLENOL 8 HOUR) 650 MG CR tablet Take 1 tablet (650 mg total) by mouth every 8 (eight) hours as needed for pain. (Patient not taking:  Reported on 04/19/2016)   . [DISCONTINUED] KLOR-CON M20 20 MEQ tablet Take 1 tablet (20 mEq total) by mouth daily. 04/19/2016: per patient dose changed and it is what she had in her possession  . [DISCONTINUED] predniSONE (DELTASONE) 50 MG tablet Take 1 tablet (50 mg total) by mouth daily with breakfast. For 2 more days (Patient not taking: Reported on 04/17/2016)    No facility-administered encounter medications on file as of 04/19/2016.     Functional Status: In your present state of health, do you have any difficulty performing the following activities: 04/10/2016 11/25/2015  Hearing? N N  Vision? N N  Difficulty concentrating or making decisions? Tempie Donning  Walking or climbing stairs? Y N  Dressing or bathing? Y Y  Doing errands, shopping? N N  Some encounter information is confidential and restricted. Go to Review Flowsheets activity to see all data.  Some recent data might be hidden    Fall/Depression Screening: PHQ 2/9 Scores 04/19/2016 04/17/2016 04/07/2016 04/05/2016 02/18/2016 12/31/2015 12/16/2015  PHQ - 2 Score 1 1 0 1 1 0 0  PHQ- 9 Score - - - - - - -  Some encounter information is confidential and restricted. Go to Review Flowsheets activity to see all data.    Assessment: Patient's medications were reviewed by comparing her actual medication bottles, medication pill box, med list from the patient chart, and discharge summary from her most recent hospitalization.   Drugs sorted by system:  Neurologic/Psychologic: Escitalopram Divalproex Benztropine (only had 6 tablets left--patient will call for a refill) Mirtazapine Ziprasidone Gabapentin  Cardiovascular: Atorvastatin Aspirin Amlodipine Furosemide Metolazone  Pulmonary/Allergy: Montelukast Breo Albuterol Nebulizer Solution Ventolin HFA Spiriva   Gastrointestinal: Omeprazole- patient is taking 1 capsule twice daily vs once daily as is on the medication list in the patient chart  Endocrine: Metformin    Pain: Naproxen- was discontinued at discharge but patient has continued to take this because she has continued to have pain and acetaminophen was ineffective  Vitamins/Minerals: Potassium 10 MEq-patient reported taking 10MEq vs 20Meq as was on the medication list Multivitamin   Medication Review Findings:  Dose Discrepancies:  1.  Potassium 10 MEq vs 20 MEq 2.  Omeprazole 42m- patient taking 1 capsule twice daily (directions on medication list state 1 capsule daily)-(patient could not remember if the dose was increased by a provider or if she did it on her own.  If deemed therapeutically appropriate, omeprazole dose could be decreased on a trial basis and increased based on symptoms due to the recent data highlighting increased risk of renal dysfunction and decrease in bone density associated with prolonged PPI use.  3.  Naproxen-patient is still taking naproxen although it was discontinued at discharge  Dose too high  Gabapentin-patient's creatinine clearance was calculated to be 474mmin, in patient's with CrCl >30 to 59 mL/minute, the recommended dose is  200 to 700 mg twice daily.  Patient is taking gabapentin  660m three times daily.  If patient's Scr has not decreased, and if deemed therapeutically appropriate, decreasing the gabapentin dose may be prudent.   Patient also reported she is using a nicotine patch (21 mg)  to assist with smoking cessation.(added to medication list)  Plan/Interventions  1.  Assisted patient in filling pill box for seven days. (Removed discontinued medications from the pill box.)  2. Provided patient with a personalized medication list created on [email or web address] (based on the med list in the patient chart, organized by time of administration, with indications).  3.  Route note to PCP   4.  Follow up with patient in within 10-14 business days.  KElayne Guerin PharmD, BDamascusClinical Pharmacist [phone]

## 2016-04-19 NOTE — Progress Notes (Signed)
Subjective:   Patient ID: Tiffany [name]    DOB: 11/19/1960, 56 y.o. female   MRN: [id number]  CC: hospital follow up   HPI: Tiffany [name] is a 56 y.o. female who presents to clinic today for hospital f/u.  Was recently admitted on 04/10/2016 for CHF exacerbation and discharged on 04/15/2016 in stable  condition . History of COPD on 6L [place], HFpEF, tobacco abuse, substance abuse (cocaine, opioids) and bipolar affective disorder.    Concerns today : Overall feeling better.  Concerned about her blood pressures and wants to know why we are holding her Lisinopril.   Tobacco cessation counseling  Discussed dangers of smoking while she is on home oxygen.  Per patient, she has given it up.  Reports has not smoked since she was admitted to hospital and did not resume on discharge.    Substance abuse counseling  Patient with history of cocaine and recreational narcotic use.  States that November 24, 2015 is her clean date and she has not used since.   CHF Discharged from hospital on Lasix 80 mg po BID and 2.5 mg daily metolazone.  Patient adamant to leave on day of discharge so was not observed on po diuretics for a day.  Reports she has been peeing 4-5x a day.  Breathing has improved overall and she is satting well on her home O2.  Has been using her nebulizer treatments Q8h and has not been wheezing.   Nose stays stuffed up which makes it harder to get air in through [place].  Denies leg swelling and shortness of breath.   Urinary incontinence- Oxybutynin was held on discharge due to resolving AKI.  Symptoms have not returned.  Reports she is able to hold her urine until has to go to the bathroom.      COPD on home 6L O2.  Would benefit from PFTs outpatient.  [name] scheduled for this on April 23rd with [facility].  Additionally, CPAP noncompliance due to discomfort.  On this hospitalization she requested nasal pillow for her home CPAP.  Order was placed while inpatient and patient received  equipment.  She has continued to not use CPAP as she states she does not know what good it does.  Likes the nasal pillow and finds it more comfortable.  After counseling, she is agreeable to using it nightly.    ROS: Denies fevers, chills, nausea, vomiting, abdominal pain, shortness of breath.   Smoking status reviewed.  Patient is a current smoker.  Medications reviewed.  Objective:   BP (!) 102/58   Pulse 91   Temp 98.8 F (37.1 C) (Oral)   Ht 5\' 4"  (1.626 m)   Wt 277 lb (125.6 kg)   SpO2 (!) 87% Comment: on 4L  BMI 47.55 kg/m  Vitals and nursing note reviewed.  General: morbidly obese, well nourished, in no acute distress  HEENT: NCAT, MMM, o/p clear Neck: supple, non-tender without lymphadenopathy CV: RRR no MRG noted Lungs: CTA B/L , on home O2 per [place]  Abdomen: soft, obese, non-tender, +bs  Skin: warm, dry, no rashes or lesions  Extremities: warm and well perfused, normal tone, mild edema bilaterally LE, no tenderness  Psych: normal mood and affect   Assessment & Plan:   (HFpEF) heart failure with preserved ejection fraction (HCC) -Patient states breathing much easier.  Lung exam clear with no crackles appreciated.  Mild nonpitting edema bilateral LE.   -Diuresing well, continue current regimen of 80 lasix po BID and  metolazone 2.5 mg daily.  -Will reassess fluid status at next visit -BMET today to check Cr  COPD (chronic obstructive pulmonary disease) ([place]) -On home 6L O2  -Would benefit from PFTs -Is scheduled to have these done outpatient on 4/23.  -Counseled on CPAP compliance   AKI (acute kidney injury) ([place]) Resolving.  -Baseline unclear ~1-1.2.  During hospitalization with Cr to 1.57.  Lisinooril and Oxybutynin were held on discharge in setting of acute kidney injury.   -Restart home medications  as necessary  Benign essential HTN Lisinopril held on discharge as resolving AKI.   -Blood pressure today 102/58 so will not restart.   Of note, patient is  overly concerned about not restarting her blood pressure medications.  Discussed with her that we will wait until a later visit given her BP today was low.  -Verapamil was discontinued on discharge as she was on 2 CCBs.  -Monitor BP at next visit and restart home Lisinopril if appropriate  Tobacco use disorder -Counseled about dangers of smoking while on oxygen, patient reports she has quit ever since date of admission 04/10/2016.   Urinary incontinence. Oxybutynin was held on discharge due to resolving AKI.  Symptoms have not returned.  Reports she is able to hold her urine until has to go to the bathroom.   -Did not restart oxy today as BMET will not return until tomorrow.   -Follow up with this at next visit and restart if appropriate.   Follow up: 2 weeks.    Orders Placed This Encounter  Procedures  . Basic metabolic panel   No orders of the defined types were placed in this encounter.   Lovenia Kim, MD [place], PGY-1 04/19/2016 9:50 AM

## 2016-04-19 NOTE — Assessment & Plan Note (Signed)
-  On home 6L O2  -Would benefit from PFTs -Is scheduled to have these done outpatient on 4/23.  -Counseled on CPAP compliance

## 2016-04-20 ENCOUNTER — Other Ambulatory Visit: Payer: Self-pay | Admitting: Pharmacist

## 2016-04-20 ENCOUNTER — Encounter (HOSPITAL_COMMUNITY): Payer: Self-pay | Admitting: Clinical

## 2016-04-20 ENCOUNTER — Telehealth: Payer: Self-pay | Admitting: Family Medicine

## 2016-04-20 ENCOUNTER — Ambulatory Visit (INDEPENDENT_AMBULATORY_CARE_PROVIDER_SITE_OTHER): Payer: Medicare HMO | Admitting: Clinical

## 2016-04-20 DIAGNOSIS — F1290 Cannabis use, unspecified, uncomplicated: Secondary | ICD-10-CM | POA: Diagnosis not present

## 2016-04-20 DIAGNOSIS — F1590 Other stimulant use, unspecified, uncomplicated: Secondary | ICD-10-CM

## 2016-04-20 DIAGNOSIS — F250 Schizoaffective disorder, bipolar type: Secondary | ICD-10-CM

## 2016-04-20 LAB — BASIC METABOLIC PANEL
BUN/Creatinine Ratio: 38 — ABNORMAL HIGH (ref 9–23)
BUN: 74 mg/dL — ABNORMAL HIGH (ref 6–24)
CO2: 34 mmol/L — ABNORMAL HIGH (ref 18–29)
Calcium: 10.4 mg/dL — ABNORMAL HIGH (ref 8.7–10.2)
Chloride: 91 mmol/L — ABNORMAL LOW (ref 96–106)
Creatinine, Ser: 1.97 mg/dL — ABNORMAL HIGH (ref 0.57–1.00)
GFR calc Af Amer: 32 mL/min/{1.73_m2} — ABNORMAL LOW (ref >59)
GFR calc non Af Amer: 28 mL/min/{1.73_m2} — ABNORMAL LOW (ref >59)
Glucose: 89 mg/dL (ref 65–99)
Potassium: 3.3 mmol/L — ABNORMAL LOW (ref 3.5–5.2)
Sodium: 145 mmol/L — ABNORMAL HIGH (ref 134–144)

## 2016-04-20 NOTE — Telephone Encounter (Signed)
Called pt to clarify medications after she met with [facility] pharmacist. No answer. Left VM.  If she returns call please relay the following:  1) She should be taking KDUR (potassium) 22mQ. 2) She should be taking Prilosec (omeprazole) 1 capsule daily (not twice daily) 3) Due to kidney function worsening, she should decrease her gabapentin to 6072mtwice daily (instead of three times daily). 4) She should try to cut back on Naproxen, this is not helping her kidney function. She should try to take Tylenol to lessen the pain.   Please let me know if she has any questions.    Thank you, CrArchie PattenMD [facility] Resident  04/20/2016, 2:46 PM

## 2016-04-20 NOTE — Progress Notes (Signed)
   THERAPIST PROGRESS NOTE  Session Time: 12:05 -12:48  Participation Level: Active  Behavioral Response: CasualDrowsyDepressed  Type of Therapy: Individual Therapy  Treatment Goals addressed: improve psychiatric symptoms,  stress management  Interventions:  Motivational Interviewing,  Summary: Tiffany [name] is a 56 y.o. female who presents with schizoaffective disorder, bipolar type and stimulate use disorder and marijuana use  Suicidal/Homicidal: No -without intent/plan  Therapist Response: [name] met with clinician for an individual session. [name] discussed her psychiatric symptoms, her current life events.She shared she had a health health crisis both motivated her and increased her depression. Clinician asked her open ended questions. She shared that she was in the hospital for a week. She shared that they had increased her Lasix 3x but instead of her symptoms improving they got worse and she was hospitalized. She shared that she was having congestive heart failure, fluid on her lungs and oxygen levels were down. She shared that she has quit smoking and now wants to work on eating better.  Clinician asked her open ended questions and [name] identified ways she could improve her health. She also shared her concerns for her son if she didn't do so. She stated she continues to work with a sponsor but hasn't been attending 12 step meetings. Client and clinician discussed stress management skills.  Plan: Return again in 2-3 weeks.  Diagnosis: Axis I:   schizoaffective disorder, bipolar type and stimulate use disorder and marijuana use    Jaylena Holloway A, LCSW 04/20/2016

## 2016-04-20 NOTE — Patient Outreach (Signed)
[facility] Care Management  04/20/2016  Tiffany [name] [birth year] [id number]   Received the following message from the patient's PCP after [facility] Pharmacist's review:  Archie Patten, MD 10 minutes ago (2:42 PM)      Called pt to clarify medications after she met with [facility] pharmacist. No answer. Left VM.  If she returns call please relay the following:  1) She should be taking KDUR (potassium) 22mQ. 2) She should be taking Prilosec (omeprazole) 1 capsule daily (not twice daily) 3) Due to kidney function worsening, she should decrease her gabapentin to 6083mtwice daily (instead of three times daily). 4) She should try to cut back on Naproxen, this is not helping her kidney function. She should try to take Tylenol to lessen the pain.   Please let me know if she has any questions.    Thank you, CrArchie PattenMD [facility] Resident  04/20/2016, 2:46 PM     Patient was called, no answer. HIPAA compliant message was left for the patient. Will recall the patient within 2 business days.  KaElayne GuerinPharmD, BCElmontlinical Pharmacist ([phone]

## 2016-04-21 ENCOUNTER — Other Ambulatory Visit: Payer: Self-pay

## 2016-04-21 ENCOUNTER — Other Ambulatory Visit: Payer: Self-pay | Admitting: Family Medicine

## 2016-04-21 ENCOUNTER — Observation Stay (HOSPITAL_COMMUNITY)
Admission: EM | Admit: 2016-04-21 | Discharge: 2016-04-24 | Disposition: A | Discharge status: Home / Self Care | Payer: Medicare HMO | Attending: Family Medicine | Admitting: Family Medicine

## 2016-04-21 ENCOUNTER — Encounter (HOSPITAL_COMMUNITY): Payer: Self-pay

## 2016-04-21 DIAGNOSIS — M159 Polyosteoarthritis, unspecified: Secondary | ICD-10-CM

## 2016-04-21 DIAGNOSIS — Z6841 Body Mass Index (BMI) 40.0 and over, adult: Secondary | ICD-10-CM | POA: Insufficient documentation

## 2016-04-21 DIAGNOSIS — I5032 Chronic diastolic (congestive) heart failure: Secondary | ICD-10-CM | POA: Insufficient documentation

## 2016-04-21 DIAGNOSIS — D649 Anemia, unspecified: Secondary | ICD-10-CM | POA: Insufficient documentation

## 2016-04-21 DIAGNOSIS — R531 Weakness: Secondary | ICD-10-CM | POA: Diagnosis present

## 2016-04-21 DIAGNOSIS — I130 Hypertensive heart and chronic kidney disease with heart failure and stage 1 through stage 4 chronic kidney disease, or unspecified chronic kidney disease: Secondary | ICD-10-CM | POA: Diagnosis not present

## 2016-04-21 DIAGNOSIS — Z7951 Long term (current) use of inhaled steroids: Secondary | ICD-10-CM | POA: Insufficient documentation

## 2016-04-21 DIAGNOSIS — R569 Unspecified convulsions: Secondary | ICD-10-CM | POA: Insufficient documentation

## 2016-04-21 DIAGNOSIS — Z7984 Long term (current) use of oral hypoglycemic drugs: Secondary | ICD-10-CM | POA: Insufficient documentation

## 2016-04-21 DIAGNOSIS — D72829 Elevated white blood cell count, unspecified: Secondary | ICD-10-CM | POA: Diagnosis not present

## 2016-04-21 DIAGNOSIS — R9431 Abnormal electrocardiogram [ECG] [EKG]: Secondary | ICD-10-CM

## 2016-04-21 DIAGNOSIS — K219 Gastro-esophageal reflux disease without esophagitis: Secondary | ICD-10-CM | POA: Diagnosis not present

## 2016-04-21 DIAGNOSIS — Z79899 Other long term (current) drug therapy: Secondary | ICD-10-CM | POA: Insufficient documentation

## 2016-04-21 DIAGNOSIS — J439 Emphysema, unspecified: Secondary | ICD-10-CM | POA: Diagnosis not present

## 2016-04-21 DIAGNOSIS — F319 Bipolar disorder, unspecified: Secondary | ICD-10-CM

## 2016-04-21 DIAGNOSIS — N179 Acute kidney failure, unspecified: Secondary | ICD-10-CM | POA: Diagnosis not present

## 2016-04-21 DIAGNOSIS — F209 Schizophrenia, unspecified: Secondary | ICD-10-CM | POA: Insufficient documentation

## 2016-04-21 DIAGNOSIS — E876 Hypokalemia: Secondary | ICD-10-CM | POA: Diagnosis not present

## 2016-04-21 DIAGNOSIS — J449 Chronic obstructive pulmonary disease, unspecified: Secondary | ICD-10-CM

## 2016-04-21 DIAGNOSIS — G4733 Obstructive sleep apnea (adult) (pediatric): Secondary | ICD-10-CM | POA: Diagnosis not present

## 2016-04-21 DIAGNOSIS — I252 Old myocardial infarction: Secondary | ICD-10-CM | POA: Insufficient documentation

## 2016-04-21 DIAGNOSIS — E1140 Type 2 diabetes mellitus with diabetic neuropathy, unspecified: Secondary | ICD-10-CM | POA: Insufficient documentation

## 2016-04-21 DIAGNOSIS — Z791 Long term (current) use of non-steroidal anti-inflammatories (NSAID): Secondary | ICD-10-CM | POA: Insufficient documentation

## 2016-04-21 DIAGNOSIS — F329 Major depressive disorder, single episode, unspecified: Secondary | ICD-10-CM | POA: Diagnosis not present

## 2016-04-21 DIAGNOSIS — Z9981 Dependence on supplemental oxygen: Secondary | ICD-10-CM | POA: Insufficient documentation

## 2016-04-21 DIAGNOSIS — E785 Hyperlipidemia, unspecified: Secondary | ICD-10-CM | POA: Diagnosis not present

## 2016-04-21 DIAGNOSIS — M1990 Unspecified osteoarthritis, unspecified site: Secondary | ICD-10-CM | POA: Diagnosis not present

## 2016-04-21 DIAGNOSIS — Z7982 Long term (current) use of aspirin: Secondary | ICD-10-CM | POA: Diagnosis not present

## 2016-04-21 DIAGNOSIS — E1122 Type 2 diabetes mellitus with diabetic chronic kidney disease: Secondary | ICD-10-CM | POA: Diagnosis not present

## 2016-04-21 DIAGNOSIS — N183 Chronic kidney disease, stage 3 (moderate): Secondary | ICD-10-CM | POA: Diagnosis not present

## 2016-04-21 DIAGNOSIS — K5900 Constipation, unspecified: Secondary | ICD-10-CM | POA: Diagnosis not present

## 2016-04-21 DIAGNOSIS — Z87891 Personal history of nicotine dependence: Secondary | ICD-10-CM | POA: Insufficient documentation

## 2016-04-21 DIAGNOSIS — Z8673 Personal history of transient ischemic attack (TIA), and cerebral infarction without residual deficits: Secondary | ICD-10-CM | POA: Insufficient documentation

## 2016-04-21 LAB — RAPID URINE DRUG SCREEN, HOSP PERFORMED
Amphetamines: NOT DETECTED
Barbiturates: NOT DETECTED
Benzodiazepines: NOT DETECTED
Cocaine: NOT DETECTED
Opiates: NOT DETECTED
Tetrahydrocannabinol: NOT DETECTED

## 2016-04-21 LAB — URINALYSIS, ROUTINE W REFLEX MICROSCOPIC
Bilirubin Urine: NEGATIVE
Glucose, UA: NEGATIVE mg/dL
Hgb urine dipstick: NEGATIVE
Ketones, ur: NEGATIVE mg/dL
Nitrite: NEGATIVE
Protein, ur: NEGATIVE mg/dL
Specific Gravity, Urine: 1.011 (ref 1.005–1.030)
pH: 5 (ref 5.0–8.0)

## 2016-04-21 LAB — CBC
HCT: 40.1 % (ref 36.0–46.0)
Hemoglobin: 11.9 g/dL — ABNORMAL LOW (ref 12.0–15.0)
MCH: 27.5 pg (ref 26.0–34.0)
MCHC: 29.7 g/dL — ABNORMAL LOW (ref 30.0–36.0)
MCV: 92.6 fL (ref 78.0–100.0)
Platelets: 392 10*3/uL (ref 150–400)
RBC: 4.33 MIL/uL (ref 3.87–5.11)
RDW: 15.7 % — ABNORMAL HIGH (ref 11.5–15.5)
WBC: 14.1 10*3/uL — ABNORMAL HIGH (ref 4.0–10.5)

## 2016-04-21 LAB — GLUCOSE, CAPILLARY: Glucose-Capillary: 110 mg/dL — ABNORMAL HIGH (ref 65–99)

## 2016-04-21 LAB — BASIC METABOLIC PANEL
Anion gap: 20 — ABNORMAL HIGH (ref 5–15)
BUN: 56 mg/dL — ABNORMAL HIGH (ref 6–20)
CO2: 33 mmol/L — ABNORMAL HIGH (ref 22–32)
Calcium: 9.3 mg/dL (ref 8.9–10.3)
Chloride: 91 mmol/L — ABNORMAL LOW (ref 101–111)
Creatinine, Ser: 2.06 mg/dL — ABNORMAL HIGH (ref 0.44–1.00)
GFR calc Af Amer: 30 mL/min — ABNORMAL LOW (ref >60)
GFR calc non Af Amer: 26 mL/min — ABNORMAL LOW (ref >60)
Glucose, Bld: 186 mg/dL — ABNORMAL HIGH (ref 65–99)
Potassium: 2.5 mmol/L — CL (ref 3.5–5.1)
Sodium: 144 mmol/L (ref 135–145)

## 2016-04-21 MED ORDER — INSULIN ASPART 100 UNIT/ML ~~LOC~~ SOLN: 0.0000 [IU] | Freq: Three times a day (TID) | SUBCUTANEOUS | Status: DC

## 2016-04-21 MED ORDER — MONTELUKAST SODIUM 10 MG PO TABS
10.0000 mg | ORAL_TABLET | Freq: Every day | ORAL | Status: DC
  Administered 2016-04-21 – 2016-04-23 (×3): 10 mg via ORAL
  Filled 2016-04-21 (×3): qty 1

## 2016-04-21 MED ORDER — POLYETHYLENE GLYCOL 3350 17 G PO PACK
17.0000 g | PACK | Freq: Every day | ORAL | Status: DC
  Administered 2016-04-21 – 2016-04-24 (×4): 17 g via ORAL
  Filled 2016-04-21 (×4): qty 1

## 2016-04-21 MED ORDER — POTASSIUM CHLORIDE CRYS ER 20 MEQ PO TBCR
40.0000 meq | EXTENDED_RELEASE_TABLET | Freq: Once | ORAL | Status: AC
  Administered 2016-04-21: 40 meq via ORAL
  Filled 2016-04-21: qty 2

## 2016-04-21 MED ORDER — BENZTROPINE MESYLATE 1 MG PO TABS
0.5000 mg | ORAL_TABLET | Freq: Every day | ORAL | Status: DC
  Administered 2016-04-22 – 2016-04-24 (×3): 0.5 mg via ORAL
  Filled 2016-04-21 (×3): qty 1

## 2016-04-21 MED ORDER — POTASSIUM CHLORIDE 2 MEQ/ML IV SOLN
30.0000 meq | Freq: Once | INTRAVENOUS | Status: AC
  Administered 2016-04-21: 30 meq via INTRAVENOUS
  Filled 2016-04-21: qty 15

## 2016-04-21 MED ORDER — FLUTICASONE FUROATE-VILANTEROL 100-25 MCG/INH IN AEPB
1.0000 | INHALATION_SPRAY | Freq: Every day | RESPIRATORY_TRACT | Status: DC
  Administered 2016-04-22 – 2016-04-24 (×3): 1 via RESPIRATORY_TRACT
  Filled 2016-04-21: qty 28

## 2016-04-21 MED ORDER — ENOXAPARIN SODIUM 60 MG/0.6ML ~~LOC~~ SOLN
60.0000 mg | SUBCUTANEOUS | Status: DC
  Administered 2016-04-21 – 2016-04-23 (×3): 60 mg via SUBCUTANEOUS
  Filled 2016-04-21 (×3): qty 0.6

## 2016-04-21 MED ORDER — ATORVASTATIN CALCIUM 40 MG PO TABS
40.0000 mg | ORAL_TABLET | Freq: Every day | ORAL | Status: DC
  Administered 2016-04-22 – 2016-04-24 (×3): 40 mg via ORAL
  Filled 2016-04-21 (×3): qty 1

## 2016-04-21 MED ORDER — NICOTINE 21 MG/24HR TD PT24
21.0000 mg | MEDICATED_PATCH | Freq: Every day | TRANSDERMAL | Status: DC
  Administered 2016-04-21 – 2016-04-24 (×4): 21 mg via TRANSDERMAL
  Filled 2016-04-21 (×4): qty 1

## 2016-04-21 MED ORDER — ACETAMINOPHEN 500 MG PO TABS: 1000.0000 mg | ORAL_TABLET | Freq: Four times a day (QID) | ORAL | Status: DC | PRN

## 2016-04-21 MED ORDER — MIRTAZAPINE 30 MG PO TABS
30.0000 mg | ORAL_TABLET | Freq: Every day | ORAL | Status: DC
  Administered 2016-04-21 – 2016-04-23 (×3): 30 mg via ORAL
  Filled 2016-04-21 (×3): qty 1

## 2016-04-21 MED ORDER — SODIUM CHLORIDE 0.9% FLUSH
3.0000 mL | Freq: Two times a day (BID) | INTRAVENOUS | Status: DC
  Administered 2016-04-21 – 2016-04-24 (×6): 3 mL via INTRAVENOUS

## 2016-04-21 MED ORDER — ASPIRIN EC 81 MG PO TBEC
81.0000 mg | DELAYED_RELEASE_TABLET | Freq: Every day | ORAL | Status: DC
  Administered 2016-04-22 – 2016-04-24 (×3): 81 mg via ORAL
  Filled 2016-04-21 (×3): qty 1

## 2016-04-21 MED ORDER — DIVALPROEX SODIUM ER 500 MG PO TB24
500.0000 mg | ORAL_TABLET | Freq: Every day | ORAL | Status: DC
  Administered 2016-04-21 – 2016-04-23 (×3): 500 mg via ORAL
  Filled 2016-04-21 (×3): qty 1

## 2016-04-21 MED ORDER — ZIPRASIDONE HCL 80 MG PO CAPS
80.0000 mg | ORAL_CAPSULE | Freq: Every day | ORAL | Status: DC
  Administered 2016-04-21 – 2016-04-23 (×3): 80 mg via ORAL
  Filled 2016-04-21 (×4): qty 1

## 2016-04-21 MED ORDER — ZIPRASIDONE HCL 80 MG PO CAPS: 80.0000 mg | ORAL_CAPSULE | Freq: Every day | ORAL | Status: DC

## 2016-04-21 MED ORDER — PANTOPRAZOLE SODIUM 40 MG PO TBEC
40.0000 mg | DELAYED_RELEASE_TABLET | Freq: Every day | ORAL | Status: DC
  Administered 2016-04-22 – 2016-04-24 (×3): 40 mg via ORAL
  Filled 2016-04-21 (×3): qty 1

## 2016-04-21 MED ORDER — TIOTROPIUM BROMIDE MONOHYDRATE 18 MCG IN CAPS
18.0000 ug | ORAL_CAPSULE | Freq: Every day | RESPIRATORY_TRACT | Status: DC
  Administered 2016-04-22 – 2016-04-24 (×3): 18 ug via RESPIRATORY_TRACT
  Filled 2016-04-21: qty 5

## 2016-04-21 MED ORDER — MIRTAZAPINE 30 MG PO TABS: 30.0000 mg | ORAL_TABLET | Freq: Every day | ORAL | Status: DC

## 2016-04-21 NOTE — ED Notes (Signed)
Dinner ordered, heart healthy diet

## 2016-04-21 NOTE — ED Provider Notes (Signed)
[place] DEPT Provider Note   CSN: [id number] Arrival date & time: 04/21/16  1517     History   Chief Complaint Chief Complaint  Patient presents with  . Weakness    HPI Tiffany [name] is a 56 y.o. female.  The history is provided by the patient and medical records.  Weakness  Primary symptoms comment: muscles "giving out". This is a new problem. The current episode started more than 1 week ago. The problem has been gradually worsening. There was no focality noted. There has been no fever. Associated symptoms include shortness of breath (chronic on 6L [place] AAT). Pertinent negatives include no chest pain, no vomiting, no altered mental status, no confusion and no headaches. There were no medications administered prior to arrival. Associated medical issues comments: HFpEF, DM2, OSA, HLD, COPD, BPD.    Past Medical History:  Diagnosis Date  . Anxiety   . Arthritis    "all over" (04/10/2016)  . Asthma   . CHF (congestive heart failure) ([place])   . Chronic bronchitis ([place])   . Chronic kidney disease    "I see a kidney dr." (04/10/2016)  . Cocaine abuse   . Complication of anesthesia    decreased bp, decreased heart rate  . Depression   . Disorder of nervous system   . Emphysema   . GERD (gastroesophageal reflux disease)   . Heart attack 1980s  . History of blood transfusion 1994   "couldn't stop bleeding from my period"  . Hyperlipidemia LDL goal <70   . Hypertension   . Incontinence   . Manic depression ([place])   . On home oxygen therapy    "6L; 24/7" (04/10/2016)  . OSA on CPAP    "wear mask sometimes" (04/10/2016)  . Paranoid ([place])    "sometimes; I'm on RX for it" (04/10/2016)  . Pneumonia    "I've had it several times; haven't had it since 06/2015" (04/10/2016)  . Schizophrenia ([place])   . Seasonal allergies   . Seizures ([place])    "don't know what kind; last one was ~ 1 yr ago" (04/10/2016)  . Sinus trouble   . Stroke [facility]) 1980s   denies residual on 04/10/2016  .  Type II diabetes mellitus [facility])     Patient Active Problem List   Diagnosis Date Noted  . Wheezing 04/12/2016  . Constipation   . (HFpEF) heart failure with preserved ejection fraction ([place])   . Benign essential HTN 03/22/2016  . Rectal bleeding 12/31/2015  . Bipolar affective disorder, mixed, severe, with psychotic behavior ([place]) 11/28/2015  . Cocaine use disorder, severe, dependence ([place]) 11/28/2015  . Hyponatremia 11/25/2015  . AKI (acute kidney injury) ([name]) 11/24/2015  . Blood in sputum   . Chronic diastolic heart failure ([place])   . SOB (shortness of breath)   . Acute on chronic respiratory failure with hypoxia and hypercapnia ([place]) 06/22/2015  . Onychomycosis 01/21/2015  . Nasal congestion 12/17/2014  . Tobacco use disorder 07/22/2014  . COPD (chronic obstructive pulmonary disease) ([place]) 07/08/2014  . COPD exacerbation ([place]) 06/26/2014  . Seborrheic keratoses 12/31/2013  . Thrush 09/19/2013  . Knee pain, bilateral 01/22/2013  . Seizure ([place]) 01/04/2013  . Health care maintenance 11/25/2012  . Folliculitis 40/08/6759  . History of kidney stones 06/18/2012  . Chronic pain syndrome 06/18/2012  . Muscle spasm 06/18/2012  . Elevated troponin 04/28/2012  . Asthma with acute exacerbation 04/28/2012  . Allergic reaction 04/07/2012  . Itching 09/06/2011  . HSV infection 08/30/2011  .  Dyslipidemia 04/24/2011  . Anemia 04/24/2011  . Diabetic neuropathy ([place]) 04/24/2011  . Obstructive sleep apnea 10/18/2010  . Asthma 10/18/2010  . Obesity 10/18/2010  . Type 2 diabetes mellitus ([place]) 10/18/2010    Past Surgical History:  Procedure Laterality Date  . CESAREAN SECTION  1997  . HERNIA REPAIR    . UMBILICAL HERNIA REPAIR  ~ 1963   "that's why I don't have a belly button"  . VAGINAL HYSTERECTOMY      OB History    No data available       Home Medications    Prior to Admission medications   Medication Sig Start Date End Date Taking? Authorizing Provider    acetaminophen (TYLENOL 8 HOUR) 650 MG CR tablet Take 1 tablet (650 mg total) by mouth every 8 (eight) hours as needed for pain. Patient not taking: Reported on 04/19/2016 04/05/16   Archie Patten, MD  albuterol (PROVENTIL HFA;VENTOLIN HFA) 108 (90 Base) MCG/ACT inhaler Inhale 2 puffs into the lungs every 6 (six) hours as needed. 12/05/15   Clovis Fredrickson, MD  albuterol (PROVENTIL) (2.5 MG/3ML) 0.083% nebulizer solution Take 3 mLs (2.5 mg total) by nebulization every 6 (six) hours as needed for wheezing or shortness of breath. 02/18/16   Amity N Rumley, DO  Alcohol Swabs (B-D SINGLE USE SWABS REGULAR) PADS CHECK CAPILLARY BLOOD GLUCOSE ONE TIME DAILY 02/03/16   Archie Patten, MD  amLODipine (NORVASC) 5 MG tablet Take 2 tablets (10 mg total) by mouth daily. 04/15/16   Sela Hua, MD  aspirin 81 MG EC tablet Take 1 tablet (81 mg total) by mouth daily. Swallow whole. 12/05/15   Clovis Fredrickson, MD  atorvastatin (LIPITOR) 40 MG tablet Take 1 tablet (40 mg total) by mouth daily. 12/05/15   Clovis Fredrickson, MD  benztropine (COGENTIN) 0.5 MG tablet Take 1 tablet (0.5 mg total) by mouth daily. 03/31/16 03/31/17  Kathlee Nations, MD  Blood Glucose Monitoring Suppl (ACCU-CHEK AVIVA PLUS) w/Device KIT CHECK BLOOD SUGAR THREE TIMES DAILY 12/02/15   Archie Patten, MD  BREO ELLIPTA 100-25 MCG/INH AEPB INHALE 1 PUFF EVERY DAY 02/15/16   Sela Hua, MD  divalproex ([name] ER) 500 MG 24 hr tablet Take 1 tablet (500 mg total) by mouth daily. Patient taking differently: Take 500 mg by mouth at bedtime.  03/31/16 03/31/17  Kathlee Nations, MD  escitalopram (LEXAPRO) 20 MG tablet Take 1 tablet (20 mg total) by mouth daily. 03/31/16   Kathlee Nations, MD  furosemide (LASIX) 40 MG tablet Take 2 tablets (80 mg total) by mouth 2 (two) times daily. 04/15/16   Sela Hua, MD  gabapentin (NEURONTIN) 600 MG tablet Take 1 tablet (600 mg total) by mouth 3 (three) times daily. 12/05/15   Jolanta B Pucilowska,  MD  glucose blood test strip Use to check blood sugar 3 times daily. 12/05/15   Clovis Fredrickson, MD  metFORMIN (GLUCOPHAGE-XR) 500 MG 24 hr tablet TAKE 1 TABLET TWICE DAILY 03/28/16   Archie Patten, MD  metolazone (ZAROXOLYN) 2.5 MG tablet Take 1 tablet (2.5 mg total) by mouth daily. 04/16/16   Sela Hua, MD  mirtazapine (REMERON) 30 MG tablet Take 1 tablet (30 mg total) by mouth at bedtime. 03/31/16   Kathlee Nations, MD  montelukast (SINGULAIR) 10 MG tablet Take 1 tablet (10 mg total) by mouth at bedtime. 12/05/15   Clovis Fredrickson, MD  Multiple Vitamins-Minerals (MULTIVITAMIN WITH MINERALS) tablet Take  1 tablet by mouth daily. Reported on 07/28/2015 12/05/15   Clovis Fredrickson, MD  naproxen (NAPROSYN) 500 MG tablet Take 500 mg by mouth 2 (two) times daily with a meal.    Historical Provider, MD  nicotine (EQL NICOTINE) 21 mg/24hr patch Place 21 mg onto the skin daily.    Historical Provider, MD  omeprazole (PRILOSEC) 40 MG capsule Take 1 capsule (40 mg total) by mouth daily. Patient taking differently: Take 40 mg by mouth 2 (two) times daily.  12/05/15   Clovis Fredrickson, MD  potassium chloride (K-DUR,KLOR-CON) 10 MEQ tablet Take 2 tablets by mouth daily. 03/28/16   Archie Patten, MD  tiotropium (SPIRIVA HANDIHALER) 18 MCG inhalation capsule Place 1 capsule (18 mcg total) into inhaler and inhale daily. 12/05/15   Clovis Fredrickson, MD  ziprasidone (GEODON) 80 MG capsule Take 1 capsule (80 mg total) by mouth at bedtime. 03/31/16   Kathlee Nations, MD    Family History Family History  Problem Relation Age of Onset  . Cancer Father     prostate  . Cancer Mother     lung  . Depression Mother   . Depression Sister   . Anxiety disorder Sister   . Schizophrenia Sister   . Bipolar disorder Sister   . Depression Sister   . Depression Brother   . Heart failure      cousin    Social History Social History  Substance Use Topics  . Smoking status: Former Smoker     Packs/day: 1.50    Years: 38.00    Types: Cigarettes    Start date: 03/13/1977  . Smokeless tobacco: Never Used  . Alcohol use No     Allergies   Hydroxyzine; Codeine; and Sulfa antibiotics   Review of Systems Review of Systems  Constitutional: Negative for chills and fever.  HENT: Negative for ear pain and sore throat.   Eyes: Negative for pain and visual disturbance.  Respiratory: Positive for shortness of breath (chronic on 6L [place] AAT). Negative for cough.   Cardiovascular: Negative for chest pain and palpitations.  Gastrointestinal: Negative for abdominal pain and vomiting.  Genitourinary: Negative for dysuria and hematuria.  Musculoskeletal: Positive for gait problem. Negative for arthralgias and back pain.  Skin: Negative for color change and rash.  Neurological: Positive for tremors and weakness. Negative for seizures, syncope and headaches.  Psychiatric/Behavioral: Negative for confusion.  All other systems reviewed and are negative.   Physical Exam Updated Vital Signs BP (!) 117/59   Pulse 89   Resp 20   SpO2 97%   Physical Exam  Constitutional: She appears well-developed and well-nourished. No distress.  HENT:  Head: Normocephalic and atraumatic.  Eyes: Conjunctivae are normal.  Neck: Neck supple.  Cardiovascular: Normal rate and regular rhythm.   No murmur heard. Pulmonary/Chest: Effort normal. No respiratory distress.  Distant breath sounds throughout w/o wheezing, on baseline 6L [place]  Abdominal: Soft. There is no tenderness.  Musculoskeletal: She exhibits no edema.  Neurological: [name] alert.  CN 2-12 grossly intact, symmetric 5/5 grip strength, moving all 4ext, no focal sensory deficits  Skin: Skin is warm and dry.  Psychiatric: She has a normal mood and affect.  Nursing note and vitals reviewed.    ED Treatments / Results  Labs (all labs ordered are listed, but only abnormal results are displayed) Labs Reviewed  BASIC METABOLIC PANEL - Abnormal;  Notable for the following:       Result Value  Potassium 2.5 (*)    Chloride 91 (*)    CO2 33 (*)    Glucose, Bld 186 (*)    BUN 56 (*)    Creatinine, Ser 2.06 (*)    GFR calc non Af Amer 26 (*)    GFR calc Af Amer 30 (*)    Anion gap 20 (*)    All other components within normal limits  CBC - Abnormal; Notable for the following:    WBC 14.1 (*)    Hemoglobin 11.9 (*)    MCHC 29.7 (*)    RDW 15.7 (*)    All other components within normal limits  URINALYSIS, ROUTINE W REFLEX MICROSCOPIC    EKG  EKG Interpretation None       Radiology No results found.  Procedures Procedures (including critical care time)  Medications Ordered in ED Medications  potassium chloride SA (K-DUR,KLOR-CON) CR tablet 40 mEq (40 mEq Oral Given 04/21/16 1646)     Initial Impression / Assessment and Plan / ED Course  I have reviewed the triage vital signs and the nursing notes.  Pertinent labs & imaging results that were available during my care of the patient were reviewed by me and considered in my medical decision making (see chart for details).     Pt with h/o HFpEF (echo 04/04/16 with EF 60-65% and G2DD, home lasix is 21m BID), obesity, T2DM, HLD, OSA, asthma, substance abuse (tobacco, hx of cocaine), bipolar disorder, COPD (home O2 is 6L), HTN & recent admission for CHF exacerbation presents with weakness. Says she started feeling the symptoms while she was in the hospital last week, but they have worsened. Last night she says a cup fell out of her hand b/c she couldn't hold on to it, and today while walking to get the mail her legs "gave out" causing her to fall on her buttocks; she denies any injury from the fall, but has been walking w/a cane since b/c she is scared she'll fall again. Denies HA, lightheadedness, CP, SOB, leg swelling, N/V/D, or recent illness.  VS & exam as above. EKG: SR @ 84bpm w/prolonged QTc (5473m & no signs of ischemia. Labs remarkable for K 2.5, Cl 91 (appears  baseline), CO2 33 (baseline), Crt 2.06 (1.46 @ d/c on 3/31), AG 20, WBC 14.1. 4075mPO K given in the ED.  Family Medicine consulted and evaluated the Pt in the ED; they will admit the Pt to their service for further evaluation and treatment.   Final Clinical Impressions(s) / ED Diagnoses   Final diagnoses:  Hypokalemia    New Prescriptions New Prescriptions   No medications on file     NicJenny ReichmannD 04/21/16 [id number] 04/22/16 [id number]

## 2016-04-21 NOTE — Progress Notes (Signed)
Placed patient on CPAP for the night with pressure set at 8cm and oxygen set at 4lpm.  

## 2016-04-21 NOTE — H&P (Signed)
[facility] Admission History and Physical Service Pager: [phone]  Patient name: Tiffany [name] Medical record number: [id number] Date of birth: 03-Dec-1960 Age: 56 y.o. Gender: female  Primary Care Provider: Kathrine Cords, MD Consultants: none Code Status: FULL  Chief Complaint: generalized muscle weakness  Assessment and Plan: QUANESHIA WAREING is a 56 y.o. female presenting with generalized muscle weakness found to have hypokalemia. PMH is significant for HFpEF (60-65% and G2DD), DM2 (controlled), HTN, OSA, COPD on 6L, HLD, Obesity, Bipolar DO, ? Hx of Seizure, hx of tobacco use, history of substance use (cocaine, opioid).  Muscle Weakness, generalized : likely due to hypokalemia. Symptoms are not consistent with stroke/tia. Neuro exam is non focal.  - admit to telemetry, attending Dr. Ree Kida - Kdur 15mq x 1 given in ED - will order IV K 327m x 1  - repeat BMP after  - PT/OT after hypokalemia has improved - CK  Hypokalemia: no palpitations as noted above but reports of generalized muscle weakness. Possibly due to increased lasix dose without adequate dose for supplement.  - telemetry  - Kdur 4067mx 1 given in ED - will order IV K 40m62m 1  - mag - repeat BMP after   AKI on CKD:  Difficult to assess baseline but seems to be ~1.3-1.4. At discharge from recent hospitalization Cr was 1.46. At clinic on 4/4 Cr 1.97. Possibly due to lasix  - monitor with labs - avoid nephrotoxic medications - avoiding IVF for now, has good PO intake - holding Lasix and Metolazone doses tonight - holding home Gabapentin and Oxybutynin  - UA  Constipation: has history of constipation. Reports of no BM since last Saturday when she was discharged from the hospital. No abdominal pain or tenderness on exam - Miralax daily  - may need suppository vs enema  HFpEF: no symptoms or signs of exacerbation although patient reports of weight gain however could not tell me how  much. ECHO 03/2016 with 60-65% and G2DD, moderate concentric LV hypertrophy and mildly dilated cavity size. No wall motion abnormalities. Home Lasix 80mg21m. Metolazone is on medication list but patient reports she is not taking this medication.  - will hold Lasix evening dose in the setting of hypokalemia and re-evaluate in the morning.  - holding Metolazone in the setting of hypokalemia   COPD: On 6 L [place] throughout the day. No symptoms of exacerbation and on home O2. Followed by pulmonology.  - continue Breo, Spiriva, Singulair  - continue oxygen 6L   DM2: On Metformin at home. A1c 5.8 in 03/2016 - will monitor CBG ACHS and add SSI if needed  OSA: - CPAP at night   Bipolar DO:  - EKG with prolonged QTc at 543. Due to this will hold Geodon, Remeron and Lexapro for now. Re-evaluate in the morning  Prolonged QTc: - holding Geodon, Remeron and Lexapro for now - monitor on tele - repeat EKG in the AM   HLD:  - continue Lipitor  Normocytic Anemia: hgb 11.9 at baseline.  - monitor  Leukocytosis: no signs or symptoms of infection. Afebrile. No fevers/chills at home - monitor with labs  FEN/GI: heart healthy, carb modified Prophylaxis: Lovenox  Disposition: admit for further management   History of Present Illness:  Tiffany [name] 55 y.58 female presenting with generalized muscle weakness  She was in her normal state of health until yesterday evening when she dropped 2 full cups of water from her hands because  of weakness. This morning she got up and felt like both of her legs were weak and she fel on her bottom. She did not feel dizzy or lightheaded, did not have palpations, chest pain, or shortness of breath and no LOC. So her son brought her to the hospital for further evaluation. Denies any focal neurological deficits. Reports that she feels like she is talking slower but thinks this is due to the low potassium. Denies recent illness, fevers, chills, cough, shortness of  breath, abdominal pain, diarrhea, lower extremity edema. Reports of constipation with last BM on Saturday.   Of note, patient was admitted to [facility] on 3/26 for HF exacerbation. Per chart review it seems that her lasix was increased from 24m BID to eventually 822mBID which she has been taking and reports she thinks she has good urine output. She was also discharged on metolazone which she reports not taking (medication does not sound familiar to her). She was also discharged on Kdur 2019mwhich she has been taking daily as prescribed.   In the ED, vitals stable. Given Kdur 64m28m  Review Of Systems: Per HPI   ROS  Patient Active Problem List   Diagnosis Date Noted  . Hypokalemia 04/21/2016  . Wheezing 04/12/2016  . Constipation   . (HFpEF) heart failure with preserved ejection fraction (HCC)[place]. Benign essential HTN 03/22/2016  . Rectal bleeding 12/31/2015  . Bipolar affective disorder, mixed, severe, with psychotic behavior (HCC)[place]/12/2015  . Cocaine use disorder, severe, dependence (HCC)[place]/12/2015  . Hyponatremia 11/25/2015  . AKI (acute kidney injury) (HCC)[place]/08/2015  . Blood in sputum   . Chronic diastolic heart failure (HCC)[place]. SOB (shortness of breath)   . Acute on chronic respiratory failure with hypoxia and hypercapnia (HCC)[place]/06/2015  . Onychomycosis 01/21/2015  . Nasal congestion 12/17/2014  . Tobacco use disorder 07/22/2014  . COPD (chronic obstructive pulmonary disease) (HCC)[place]/22/2016  . COPD exacerbation (HCC)[place]/10/2014  . Seborrheic keratoses 12/31/2013  . Thrush 09/19/2013  . Knee pain, bilateral 01/22/2013  . Seizure (HCC)[place]/20/2014  . Health care maintenance 11/25/2012  . Folliculitis 11/169/48/5462History of kidney stones 06/18/2012  . Chronic pain syndrome 06/18/2012  . Muscle spasm 06/18/2012  . Elevated troponin 04/28/2012  . Asthma with acute exacerbation 04/28/2012  . Allergic reaction 04/07/2012  . Itching 09/06/2011  . HSV infection  08/30/2011  . Dyslipidemia 04/24/2011  . Anemia 04/24/2011  . Diabetic neuropathy (HCC)[place]/08/2011  . Obstructive sleep apnea 10/18/2010  . Asthma 10/18/2010  . Obesity 10/18/2010  . Type 2 diabetes mellitus (HCC)[place]/02/2010    Past Medical History: Past Medical History:  Diagnosis Date  . Anxiety   . Arthritis    "all over" (04/10/2016)  . Asthma   . CHF (congestive heart failure) (HCC)[place]. Chronic bronchitis (HCC)[place]. Chronic kidney disease    "I see a kidney dr." (04/10/2016)  . Cocaine abuse   . Complication of anesthesia    decreased bp, decreased heart rate  . Depression   . Disorder of nervous system   . Emphysema   . GERD (gastroesophageal reflux disease)   . Heart attack 1980s  . History of blood transfusion 1994   "couldn't stop bleeding from my period"  . Hyperlipidemia LDL goal <70   . Hypertension   . Incontinence   . Manic depression (HCC)[place]. On home oxygen therapy    "6L; 24/7" (04/10/2016)  .  OSA on CPAP    "wear mask sometimes" (04/10/2016)  . Paranoid ([place])    "sometimes; I'm on RX for it" (04/10/2016)  . Pneumonia    "I've had it several times; haven't had it since 06/2015" (04/10/2016)  . Schizophrenia ([place])   . Seasonal allergies   . Seizures ([place])    "don't know what kind; last one was ~ 1 yr ago" (04/10/2016)  . Sinus trouble   . Stroke [facility]) 1980s   denies residual on 04/10/2016  . Type II diabetes mellitus ([place])     Past Surgical History: Past Surgical History:  Procedure Laterality Date  . CESAREAN SECTION  1997  . HERNIA REPAIR    . UMBILICAL HERNIA REPAIR  ~ 1963   "that's why I don't have a belly button"  . VAGINAL HYSTERECTOMY      Social History: Social History  Substance Use Topics  . Smoking status: Former Smoker    Packs/day: 1.50    Years: 38.00    Types: Cigarettes    Start date: 03/13/1977  . Smokeless tobacco: Never Used  . Alcohol use No   Additional social history:  Please also refer to relevant sections of  EMR.  Family History: Family History  Problem Relation Age of Onset  . Cancer Father     prostate  . Cancer Mother     lung  . Depression Mother   . Depression Sister   . Anxiety disorder Sister   . Schizophrenia Sister   . Bipolar disorder Sister   . Depression Sister   . Depression Brother   . Heart failure      cousin    Allergies and Medications: Allergies  Allergen Reactions  . Hydroxyzine Shortness Of Breath    Unknown-throat closed up  . Codeine Nausea And Vomiting  . Sulfa Antibiotics Itching   No current facility-administered medications on file prior to encounter.    Current Outpatient Prescriptions on File Prior to Encounter  Medication Sig Dispense Refill  . acetaminophen (TYLENOL 8 HOUR) 650 MG CR tablet Take 1 tablet (650 mg total) by mouth every 8 (eight) hours as needed for pain. (Patient not taking: Reported on 04/19/2016) 60 tablet 0  . albuterol (PROVENTIL HFA;VENTOLIN HFA) 108 (90 Base) MCG/ACT inhaler Inhale 2 puffs into the lungs every 6 (six) hours as needed. 1 Inhaler 6  . albuterol (PROVENTIL) (2.5 MG/3ML) 0.083% nebulizer solution Take 3 mLs (2.5 mg total) by nebulization every 6 (six) hours as needed for wheezing or shortness of breath. 150 mL 1  . Alcohol Swabs (B-D SINGLE USE SWABS REGULAR) PADS CHECK CAPILLARY BLOOD GLUCOSE ONE TIME DAILY 100 each 3  . amLODipine (NORVASC) 5 MG tablet Take 2 tablets (10 mg total) by mouth daily. 180 tablet 0  . aspirin 81 MG EC tablet Take 1 tablet (81 mg total) by mouth daily. Swallow whole. 30 tablet 1  . atorvastatin (LIPITOR) 40 MG tablet Take 1 tablet (40 mg total) by mouth daily. 30 tablet 1  . benztropine (COGENTIN) 0.5 MG tablet Take 1 tablet (0.5 mg total) by mouth daily. 30 tablet 2  . Blood Glucose Monitoring Suppl (ACCU-CHEK AVIVA PLUS) w/Device KIT CHECK BLOOD SUGAR THREE TIMES DAILY 1 kit 0  . BREO ELLIPTA 100-25 MCG/INH AEPB INHALE 1 PUFF EVERY DAY 180 each 0  . divalproex ([name] ER) 500 MG 24 hr  tablet Take 1 tablet (500 mg total) by mouth daily. (Patient taking differently: Take 500 mg by mouth at bedtime. )  30 tablet 2  . escitalopram (LEXAPRO) 20 MG tablet Take 1 tablet (20 mg total) by mouth daily. 30 tablet 2  . furosemide (LASIX) 40 MG tablet Take 2 tablets (80 mg total) by mouth 2 (two) times daily. 120 tablet 0  . gabapentin (NEURONTIN) 600 MG tablet Take 1 tablet (600 mg total) by mouth 3 (three) times daily. 90 tablet 1  . glucose blood test strip Use to check blood sugar 3 times daily. 100 each 12  . metFORMIN (GLUCOPHAGE-XR) 500 MG 24 hr tablet TAKE 1 TABLET TWICE DAILY 180 tablet 0  . metolazone (ZAROXOLYN) 2.5 MG tablet Take 1 tablet (2.5 mg total) by mouth daily. 30 tablet 0  . mirtazapine (REMERON) 30 MG tablet Take 1 tablet (30 mg total) by mouth at bedtime. 30 tablet 2  . montelukast (SINGULAIR) 10 MG tablet Take 1 tablet (10 mg total) by mouth at bedtime. 30 tablet 1  . Multiple Vitamins-Minerals (MULTIVITAMIN WITH MINERALS) tablet Take 1 tablet by mouth daily. Reported on 07/28/2015 30 tablet 1  . naproxen (NAPROSYN) 500 MG tablet Take 500 mg by mouth 2 (two) times daily with a meal.    . nicotine (EQL NICOTINE) 21 mg/24hr patch Place 21 mg onto the skin daily.    [name] omeprazole (PRILOSEC) 40 MG capsule Take 1 capsule (40 mg total) by mouth daily. (Patient taking differently: Take 40 mg by mouth 2 (two) times daily. ) 30 capsule 1  . potassium chloride (K-DUR,KLOR-CON) 10 MEQ tablet Take 2 tablets by mouth daily.    [name] tiotropium (SPIRIVA HANDIHALER) 18 MCG inhalation capsule Place 1 capsule (18 mcg total) into inhaler and inhale daily. 90 capsule 1  . ziprasidone (GEODON) 80 MG capsule Take 1 capsule (80 mg total) by mouth at bedtime. 30 capsule 2    Objective: BP 130/63   Pulse 80   Resp 16   SpO2 97%  Exam: General: NAD, obese Eyes: EMOI, PERRL ENTM: oropharynx normal Neck: supple, normal ROM Cardiovascular: RRR, systolic murmur noted in the LUSB. No LE  edema Respiratory: On NC6L normal effort, decreased lung sounds at the bases likely due to body habitus, otherwise CTAB Gastrointestinal: Soft, nontender, nondistended, NABS, no organomegaly MSK: able to move all extremities  Derm: no rash  Neuro:  Awake, alert, CN2-12 intact, normal strength and sensation to light touch in upper and lower extremities bilaterally, normal finger to nose, normal speech Psych: Mood and affect euthymic, normal rate and volume of speech  Labs and Imaging: CBC BMET   Recent Labs Lab 04/21/16 1531  WBC 14.1*  HGB 11.9*  HCT 40.1  PLT 392    Recent Labs Lab 04/21/16 1531  NA 144  K 2.5*  CL 91*  CO2 33*  BUN 56*  CREATININE 2.06*  GLUCOSE 186*  CALCIUM 9.3    UA: moderate leukocytes EKG: NSR, prolonged Qtc at [place], MD 04/21/2016, 6:59 PM PGY-2, [place] Intern pager: [phone], text pages welcome

## 2016-04-21 NOTE — ED Triage Notes (Addendum)
Pt presents with c/o weakness. The weakness is in her arms and legs and began about 1 month ago. She has been having episodes of feeling shaky and unable to hold objects with her hands or feeling that her legs may give out. She reports last night she had an episode where she dropped everything she tried to pick up with both of her hands, then this morning her legs gave out and she fell. She denies any loss of consciousness, pain, dizziness, lightheadedness. She overall feels okay.She was admitted here for CHF at the end of March, reported these symptoms to her care team but was never given a diagnosis.

## 2016-04-21 NOTE — Progress Notes (Signed)
Tiffany [name] [id number] Admitted to 6W73: 04/21/2016 8:42 PM Attending Provider: Lupita Dawn, MD    Tiffany [name] is a 56 y.o. female patient admitted from ED awake, alert  & orientated  X 3,  Full Code, VSS - Blood pressure 129/75, pulse 86, temperature 98 F (36.7 C), temperature source Oral, resp. rate 18, height 5\' 4"  (1.626 m), weight 126.2 kg (278 lb 3.5 oz), SpO2 98 %., O2 6 L nasal cannular, no c/o shortness of breath, no c/o chest pain, no distress noted. Tele # 15 placed and pt is currently running: SR   IV site WDL:   with a transparent dsg that's clean dry and intact.  Allergies:   Allergies  Allergen Reactions  . Hydroxyzine Shortness Of Breath    -throat closed up  . Codeine Nausea And Vomiting  . Sulfa Antibiotics Itching     Past Medical History:  Diagnosis Date  . Anxiety   . Arthritis    "all over" (04/10/2016)  . Asthma   . CHF (congestive heart failure) ([place])   . Chronic bronchitis ([place])   . Chronic kidney disease    "I see a kidney dr." (04/10/2016)  . Cocaine abuse   . Complication of anesthesia    decreased bp, decreased heart rate  . Depression   . Disorder of nervous system   . Emphysema   . GERD (gastroesophageal reflux disease)   . Heart attack 1980s  . History of blood transfusion 1994   "couldn't stop bleeding from my period"  . Hyperlipidemia LDL goal <70   . Hypertension   . Incontinence   . Manic depression ([place])   . On home oxygen therapy    "6L; 24/7" (04/10/2016)  . OSA on CPAP    "wear mask sometimes" (04/10/2016)  . Paranoid ([place])    "sometimes; I'm on RX for it" (04/10/2016)  . Pneumonia    "I've had it several times; haven't had it since 06/2015" (04/10/2016)  . Schizophrenia ([place])   . Seasonal allergies   . Seizures ([place])    "don't know what kind; last one was ~ 1 yr ago" (04/10/2016)  . Sinus trouble   . Stroke [facility]) 1980s   denies residual on 04/10/2016  . Type II diabetes mellitus ([place])     History:  obtained from  patient  Pt orientation to unit, room and routine. Information packet given to patient and safety video refused.  Admission INP armband ID verified with patient, and in place. SR up x 2, fall risk assessment complete with Patient verbalizing understanding of risks associated with falls. Pt verbalizes an understanding of how to use the call bell and to call for help before getting out of bed.  Skin, clean-dry- intact without evidence of bruising, or skin tears.   No evidence of skin break down noted on exam.  Will cont to monitor and assist as needed.  Tiffany Ames, RN 04/21/2016 8:42 PM

## 2016-04-21 NOTE — ED Notes (Signed)
CRITICAL VALUE ALERT  Critical value received:  K 2.5  Date of notification:  04/21/2016   Time of notification: 8372  Critical value read back:Yes.    Nurse who received alert:  Leodis Rains  MD notified (1st page):  Dr. Billy Fischer- verbal order for EKG and PO K 32meq  Time of first page:  1626.

## 2016-04-21 NOTE — ED Notes (Signed)
Pt assisted to bedside commode

## 2016-04-21 NOTE — ED Notes (Signed)
Pt's meal delivered to room

## 2016-04-22 ENCOUNTER — Encounter (HOSPITAL_COMMUNITY): Payer: Self-pay | Admitting: *Deleted

## 2016-04-22 DIAGNOSIS — J439 Emphysema, unspecified: Secondary | ICD-10-CM | POA: Diagnosis not present

## 2016-04-22 DIAGNOSIS — E876 Hypokalemia: Secondary | ICD-10-CM | POA: Diagnosis not present

## 2016-04-22 DIAGNOSIS — E1122 Type 2 diabetes mellitus with diabetic chronic kidney disease: Secondary | ICD-10-CM | POA: Diagnosis not present

## 2016-04-22 DIAGNOSIS — N183 Chronic kidney disease, stage 3 (moderate): Secondary | ICD-10-CM | POA: Diagnosis not present

## 2016-04-22 DIAGNOSIS — F329 Major depressive disorder, single episode, unspecified: Secondary | ICD-10-CM | POA: Diagnosis not present

## 2016-04-22 DIAGNOSIS — K219 Gastro-esophageal reflux disease without esophagitis: Secondary | ICD-10-CM | POA: Diagnosis not present

## 2016-04-22 DIAGNOSIS — E785 Hyperlipidemia, unspecified: Secondary | ICD-10-CM | POA: Diagnosis not present

## 2016-04-22 DIAGNOSIS — M1990 Unspecified osteoarthritis, unspecified site: Secondary | ICD-10-CM

## 2016-04-22 DIAGNOSIS — I5032 Chronic diastolic (congestive) heart failure: Secondary | ICD-10-CM | POA: Diagnosis not present

## 2016-04-22 DIAGNOSIS — R69 Illness, unspecified: Secondary | ICD-10-CM | POA: Diagnosis not present

## 2016-04-22 DIAGNOSIS — M159 Polyosteoarthritis, unspecified: Secondary | ICD-10-CM

## 2016-04-22 DIAGNOSIS — I130 Hypertensive heart and chronic kidney disease with heart failure and stage 1 through stage 4 chronic kidney disease, or unspecified chronic kidney disease: Secondary | ICD-10-CM | POA: Diagnosis not present

## 2016-04-22 DIAGNOSIS — F319 Bipolar disorder, unspecified: Secondary | ICD-10-CM

## 2016-04-22 DIAGNOSIS — R531 Weakness: Secondary | ICD-10-CM

## 2016-04-22 LAB — CBC
HCT: 35.8 % — ABNORMAL LOW (ref 36.0–46.0)
Hemoglobin: 10.7 g/dL — ABNORMAL LOW (ref 12.0–15.0)
MCH: 27.6 pg (ref 26.0–34.0)
MCHC: 29.9 g/dL — ABNORMAL LOW (ref 30.0–36.0)
MCV: 92.3 fL (ref 78.0–100.0)
Platelets: 349 10*3/uL (ref 150–400)
RBC: 3.88 MIL/uL (ref 3.87–5.11)
RDW: 15.8 % — ABNORMAL HIGH (ref 11.5–15.5)
WBC: 11.7 10*3/uL — ABNORMAL HIGH (ref 4.0–10.5)

## 2016-04-22 LAB — BASIC METABOLIC PANEL
Anion gap: 14 (ref 5–15)
Anion gap: 9 (ref 5–15)
BUN: 57 mg/dL — ABNORMAL HIGH (ref 6–20)
BUN: 40 mg/dL — ABNORMAL HIGH (ref 6–20)
CO2: 38 mmol/L — ABNORMAL HIGH (ref 22–32)
CO2: 37 mmol/L — ABNORMAL HIGH (ref 22–32)
Calcium: 9.2 mg/dL (ref 8.9–10.3)
Calcium: 9.2 mg/dL (ref 8.9–10.3)
Chloride: 91 mmol/L — ABNORMAL LOW (ref 101–111)
Chloride: 94 mmol/L — ABNORMAL LOW (ref 101–111)
Creatinine, Ser: 1.76 mg/dL — ABNORMAL HIGH (ref 0.44–1.00)
Creatinine, Ser: 1.52 mg/dL — ABNORMAL HIGH (ref 0.44–1.00)
GFR calc Af Amer: 36 mL/min — ABNORMAL LOW (ref >60)
GFR calc Af Amer: 43 mL/min — ABNORMAL LOW (ref >60)
GFR calc non Af Amer: 31 mL/min — ABNORMAL LOW (ref >60)
GFR calc non Af Amer: 37 mL/min — ABNORMAL LOW (ref >60)
Glucose, Bld: 98 mg/dL (ref 65–99)
Glucose, Bld: 121 mg/dL — ABNORMAL HIGH (ref 65–99)
Potassium: 2.6 mmol/L — CL (ref 3.5–5.1)
Potassium: 2.8 mmol/L — ABNORMAL LOW (ref 3.5–5.1)
Sodium: 143 mmol/L (ref 135–145)
Sodium: 140 mmol/L (ref 135–145)

## 2016-04-22 LAB — RENAL FUNCTION PANEL
Albumin: 3.2 g/dL — ABNORMAL LOW (ref 3.5–5.0)
Anion gap: 12 (ref 5–15)
BUN: 50 mg/dL — ABNORMAL HIGH (ref 6–20)
CO2: 38 mmol/L — ABNORMAL HIGH (ref 22–32)
Calcium: 9.1 mg/dL (ref 8.9–10.3)
Chloride: 92 mmol/L — ABNORMAL LOW (ref 101–111)
Creatinine, Ser: 1.45 mg/dL — ABNORMAL HIGH (ref 0.44–1.00)
GFR calc Af Amer: 46 mL/min — ABNORMAL LOW (ref >60)
GFR calc non Af Amer: 39 mL/min — ABNORMAL LOW (ref >60)
Glucose, Bld: 128 mg/dL — ABNORMAL HIGH (ref 65–99)
Phosphorus: 2.3 mg/dL — ABNORMAL LOW (ref 2.5–4.6)
Potassium: 2.5 mmol/L — CL (ref 3.5–5.1)
Sodium: 142 mmol/L (ref 135–145)

## 2016-04-22 LAB — CK: Total CK: 127 U/L (ref 38–234)

## 2016-04-22 LAB — GLUCOSE, CAPILLARY
Glucose-Capillary: 76 mg/dL (ref 65–99)
Glucose-Capillary: 112 mg/dL — ABNORMAL HIGH (ref 65–99)
Glucose-Capillary: 135 mg/dL — ABNORMAL HIGH (ref 65–99)
Glucose-Capillary: 103 mg/dL — ABNORMAL HIGH (ref 65–99)

## 2016-04-22 LAB — MAGNESIUM: Magnesium: 2.3 mg/dL (ref 1.7–2.4)

## 2016-04-22 MED ORDER — POTASSIUM CHLORIDE CRYS ER 20 MEQ PO TBCR
40.0000 meq | EXTENDED_RELEASE_TABLET | Freq: Once | ORAL | Status: AC
  Administered 2016-04-22: 40 meq via ORAL
  Filled 2016-04-22: qty 2

## 2016-04-22 MED ORDER — POTASSIUM CHLORIDE CRYS ER 20 MEQ PO TBCR
40.0000 meq | EXTENDED_RELEASE_TABLET | Freq: Two times a day (BID) | ORAL | Status: AC
  Administered 2016-04-22 – 2016-04-23 (×2): 40 meq via ORAL
  Filled 2016-04-22 (×2): qty 2

## 2016-04-22 MED ORDER — MELOXICAM 7.5 MG PO TABS
7.5000 mg | ORAL_TABLET | Freq: Once | ORAL | Status: AC
  Administered 2016-04-22: 7.5 mg via ORAL
  Filled 2016-04-22: qty 1

## 2016-04-22 MED ORDER — SODIUM CHLORIDE 0.9 % IV SOLN
30.0000 meq | Freq: Once | INTRAVENOUS | Status: AC
  Administered 2016-04-22: 30 meq via INTRAVENOUS
  Filled 2016-04-22 (×2): qty 15

## 2016-04-22 MED ORDER — POTASSIUM CHLORIDE 2 MEQ/ML IV SOLN
30.0000 meq | Freq: Once | INTRAVENOUS | Status: AC
  Administered 2016-04-22: 30 meq via INTRAVENOUS
  Filled 2016-04-22: qty 15

## 2016-04-22 MED ORDER — SODIUM CHLORIDE 0.9 % IV SOLN
30.0000 meq | Freq: Once | INTRAVENOUS | Status: AC
  Administered 2016-04-22: 30 meq via INTRAVENOUS
  Filled 2016-04-22: qty 15

## 2016-04-22 NOTE — Progress Notes (Signed)
CRITICAL VALUE ALERT  Critical value received:  K+ 2.6  Date of notification: 04/22/16  Time of notification:  03:10  Critical value read back: yes  Nurse who received alert: Eliyah Bazzi, Josephine Cables   MD notified (1st page):  Family Med teaching service  Time of first page:  03:12  MD notified (2nd page):  Time of second page:  Responding MD:  Dallas Schimke  Time MD responded:  [phone]

## 2016-04-22 NOTE — Care Management Obs Status (Signed)
[place] NOTIFICATION   Patient Details  Name: LATACHA TEXEIRA MRN: [id number] Date of Birth: 1960-10-18   Medicare Observation Status Notification Given:  Yes    Carles Collet, RN 04/22/2016, 4:17 PM

## 2016-04-22 NOTE — Progress Notes (Signed)
Family Medicine Teaching Service Daily Progress Note Intern Pager: [phone]  Patient name: Tiffany [name] Medical record number: [id number] Date of birth: 1960/03/26 Age: 56 y.o. Gender: female  Primary Care Provider: Kathrine Cords, MD Consultants: none Code Status: full  Pt Overview and Major Events to Date:  4/6 - admit for hypokalemia  Assessment and Plan: AUDA FINFROCK is a 56 y.o. female presenting with generalized muscle weakness found to have hypokalemia. PMH is significant for HFpEF (60-65% and G2DD), DM2 (controlled), HTN, OSA, COPD on 6L, HLD, Obesity, Bipolar DO, ? Hx of Seizure, hx of tobacco use, history of substance use (cocaine, opioid).  Muscle Weakness, generalized 2/2 hypokalemia : likely due to loop diuretic. Neuro exam is non focal. s/p total 140 mEq repletion. K 2.5 -> 2.6. CK wnl, magnesium 2.3 - telemetry - PT/OT after hypokalemia has improved - recheck AM RFP and continue to replete K as indicated   AKI on CKD:  Difficult to assess baseline but seems to be ~1.3-1.4. At discharge from recent hospitalization Cr was 1.46. At clinic on 4/4 Cr 1.97. Possibly due to lasix. Now Cr 1.76. UA with mod leuks, no blood or protein or casts - monitor, avoid nephrotoxic medications - avoiding IVF for now, has good PO intake - holding Lasix and Metolazone currently - holding home Gabapentin and Oxybutynin   Constipation: Reports of no BM since last Saturday when she was discharged from the hospital. No abdominal pain or tenderness on exam - Miralax daily - may need suppository vs enema  HFpEF  HLD: no symptoms or signs of exacerbation although patient reports of weight gain however could not tell me how much. ECHO 03/2016 with 60-65% and G2DD, moderate concentric LV hypertrophy and mildly dilated cavity size. No wall motion abnormalities. Home Lasix 80mg  BID. Metolazone is on medication list but patient reports she is not taking this medication. - holding Lasix and  Metolazone in the setting of hypokalemia  - Continue home lipitor  COPD  OSA: On 6 L [place] throughout the day. No symptoms of exacerbation and on home O2. Followed by pulmonology.  - continue Breo, Spiriva, Singulair  - continue oxygen 6L  - CPAP at night   DM2: On Metformin at home. A1c 5.8 in 03/2016 - will monitor CBG ACHS and add SSI if needed - holding metformin  Bipolar DO  Prolonged QTc: EKG with prolonged QTc at 543. Due to this wanted to hold Geodon, Remeron and Lexapro. Patient stated she would leave AMA if held, so continued. Repeat EKG with QTc improving to 490 - Continue home meds - Repeat EKG 4/8  Normocytic Anemia, likely anemia of chronic disease: hgb 11.9 at baseline.  - monitor  Leukocytosis: no signs or symptoms of infection. Afebrile. No fevers/chills at home. Deneis urinary symptoms, but UA with moderate leuks - monitor  - send Urine culture  FEN/GI: heart healthy, carb modified, SLIV Prophylaxis: Lovenox  Disposition: pending improvement  Subjective:  Muscle weakness is improving.  No CP, SOB.  No abd pain.   Objective: Temp:  [97.8 F (36.6 C)-98 F (36.7 C)] 97.8 F (36.6 C) (04/07 0605) Pulse Rate:  [71-93] 71 (04/07 0605) Resp:  [16-25] 18 (04/07 0605) BP: (116-144)/(58-76) 144/67 (04/07 0605) SpO2:  [95 %-100 %] 100 % (04/07 0605) Weight:  [278 lb 3.5 oz (126.2 kg)] 278 lb 3.5 oz (126.2 kg) (04/06 2019) Physical Exam: General: NAD, obese Eyes: EMOI, PERRL Cardiovascular: RRR, systolic murmur noted in the LUSB. No LE edema Respiratory:  On NC6L normal effort, decreased lung sounds at the bases likely due to body habitus, otherwise CTAB Gastrointestinal: Soft, nontender, nondistended, NABS, no organomegaly MSK: able to move all extremities  Derm: no rash  Neuro:  Awake, alert, CN2-12 intact, normal strength and sensation to light touch in upper and lower extremities bilaterally, normal speech Psych: Mood and affect euthymic, normal rate and  volume of speech  Laboratory:  Recent Labs Lab 04/21/16 1531 04/22/16 0121  WBC 14.1* 11.7*  HGB 11.9* 10.7*  HCT 40.1 35.8*  PLT 392 349    Recent Labs Lab 04/19/16 0940 04/21/16 1531 04/22/16 0120  NA 145* 144 143  K 3.3* 2.5* 2.6*  CL 91* 91* 91*  CO2 34* 33* 38*  BUN 74* 56* 57*  CREATININE 1.97* 2.06* 1.76*  CALCIUM 10.4* 9.3 9.2  GLUCOSE 89 186* 98    UA: moderate leukocytes EKG: NSR, prolonged Qtc at 543  Imaging/Diagnostic Tests: No results found.  Virginia Crews, MD, MPH 04/22/2016, 9:06 AM PGY-3, [place] Intern pager: [phone], text pages welcome

## 2016-04-22 NOTE — Progress Notes (Signed)
Call received from the lab about pt critical lab value potassium of 2.5. MD notified. Awaiting doctor's order.

## 2016-04-22 NOTE — Progress Notes (Signed)
CRITICAL VALUE ALERT  Critical value received:  Potassium 2.5  Date of notification:  04/22/16  Time of notification:  1025  Critical value read back:Yes.    Nurse who received alert:  Tamera Punt  MD notified (1st page):  Family med  Time of first page:  1122

## 2016-04-23 DIAGNOSIS — I5032 Chronic diastolic (congestive) heart failure: Secondary | ICD-10-CM | POA: Diagnosis not present

## 2016-04-23 DIAGNOSIS — E785 Hyperlipidemia, unspecified: Secondary | ICD-10-CM | POA: Diagnosis not present

## 2016-04-23 DIAGNOSIS — J449 Chronic obstructive pulmonary disease, unspecified: Secondary | ICD-10-CM | POA: Diagnosis not present

## 2016-04-23 DIAGNOSIS — M1990 Unspecified osteoarthritis, unspecified site: Secondary | ICD-10-CM | POA: Diagnosis not present

## 2016-04-23 DIAGNOSIS — K219 Gastro-esophageal reflux disease without esophagitis: Secondary | ICD-10-CM | POA: Diagnosis not present

## 2016-04-23 DIAGNOSIS — I130 Hypertensive heart and chronic kidney disease with heart failure and stage 1 through stage 4 chronic kidney disease, or unspecified chronic kidney disease: Secondary | ICD-10-CM | POA: Diagnosis not present

## 2016-04-23 DIAGNOSIS — R69 Illness, unspecified: Secondary | ICD-10-CM | POA: Diagnosis not present

## 2016-04-23 DIAGNOSIS — G4733 Obstructive sleep apnea (adult) (pediatric): Secondary | ICD-10-CM | POA: Diagnosis not present

## 2016-04-23 DIAGNOSIS — R531 Weakness: Secondary | ICD-10-CM

## 2016-04-23 DIAGNOSIS — F319 Bipolar disorder, unspecified: Secondary | ICD-10-CM | POA: Diagnosis not present

## 2016-04-23 DIAGNOSIS — E1122 Type 2 diabetes mellitus with diabetic chronic kidney disease: Secondary | ICD-10-CM | POA: Diagnosis not present

## 2016-04-23 DIAGNOSIS — F329 Major depressive disorder, single episode, unspecified: Secondary | ICD-10-CM | POA: Diagnosis not present

## 2016-04-23 DIAGNOSIS — J439 Emphysema, unspecified: Secondary | ICD-10-CM | POA: Diagnosis not present

## 2016-04-23 DIAGNOSIS — R9431 Abnormal electrocardiogram [ECG] [EKG]: Secondary | ICD-10-CM | POA: Diagnosis not present

## 2016-04-23 DIAGNOSIS — E876 Hypokalemia: Secondary | ICD-10-CM | POA: Diagnosis not present

## 2016-04-23 DIAGNOSIS — N183 Chronic kidney disease, stage 3 (moderate): Secondary | ICD-10-CM | POA: Diagnosis not present

## 2016-04-23 LAB — BASIC METABOLIC PANEL
Anion gap: 12 (ref 5–15)
Anion gap: 7 (ref 5–15)
BUN: 35 mg/dL — ABNORMAL HIGH (ref 6–20)
BUN: 27 mg/dL — ABNORMAL HIGH (ref 6–20)
CO2: 34 mmol/L — ABNORMAL HIGH (ref 22–32)
CO2: 34 mmol/L — ABNORMAL HIGH (ref 22–32)
Calcium: 8.9 mg/dL (ref 8.9–10.3)
Calcium: 9.3 mg/dL (ref 8.9–10.3)
Chloride: 98 mmol/L — ABNORMAL LOW (ref 101–111)
Chloride: 101 mmol/L (ref 101–111)
Creatinine, Ser: 1.15 mg/dL — ABNORMAL HIGH (ref 0.44–1.00)
Creatinine, Ser: 1.08 mg/dL — ABNORMAL HIGH (ref 0.44–1.00)
GFR calc Af Amer: >60 mL/min (ref >60)
GFR calc Af Amer: >60 mL/min (ref >60)
GFR calc non Af Amer: 52 mL/min — ABNORMAL LOW (ref >60)
GFR calc non Af Amer: 56 mL/min — ABNORMAL LOW (ref >60)
Glucose, Bld: 85 mg/dL (ref 65–99)
Glucose, Bld: 121 mg/dL — ABNORMAL HIGH (ref 65–99)
Potassium: 3.3 mmol/L — ABNORMAL LOW (ref 3.5–5.1)
Potassium: 3.6 mmol/L (ref 3.5–5.1)
Sodium: 144 mmol/L (ref 135–145)
Sodium: 142 mmol/L (ref 135–145)

## 2016-04-23 LAB — CBC
HCT: 34.5 % — ABNORMAL LOW (ref 36.0–46.0)
Hemoglobin: 10.4 g/dL — ABNORMAL LOW (ref 12.0–15.0)
MCH: 27.6 pg (ref 26.0–34.0)
MCHC: 30.1 g/dL (ref 30.0–36.0)
MCV: 91.5 fL (ref 78.0–100.0)
Platelets: 306 10*3/uL (ref 150–400)
RBC: 3.77 MIL/uL — ABNORMAL LOW (ref 3.87–5.11)
RDW: 15.7 % — ABNORMAL HIGH (ref 11.5–15.5)
WBC: 10.6 10*3/uL — ABNORMAL HIGH (ref 4.0–10.5)

## 2016-04-23 LAB — GLUCOSE, CAPILLARY
Glucose-Capillary: 96 mg/dL (ref 65–99)
Glucose-Capillary: 83 mg/dL (ref 65–99)
Glucose-Capillary: 103 mg/dL — ABNORMAL HIGH (ref 65–99)
Glucose-Capillary: 106 mg/dL — ABNORMAL HIGH (ref 65–99)

## 2016-04-23 LAB — URINE CULTURE

## 2016-04-23 LAB — MAGNESIUM: Magnesium: 2.4 mg/dL (ref 1.7–2.4)

## 2016-04-23 MED ORDER — METFORMIN HCL ER 500 MG PO TB24
500.0000 mg | ORAL_TABLET | Freq: Two times a day (BID) | ORAL | Status: DC
  Administered 2016-04-23 – 2016-04-24 (×2): 500 mg via ORAL
  Filled 2016-04-23 (×3): qty 1

## 2016-04-23 MED ORDER — MELOXICAM 7.5 MG PO TABS
7.5000 mg | ORAL_TABLET | Freq: Once | ORAL | Status: AC
  Administered 2016-04-23: 7.5 mg via ORAL
  Filled 2016-04-23: qty 1

## 2016-04-23 MED ORDER — FUROSEMIDE 80 MG PO TABS
80.0000 mg | ORAL_TABLET | Freq: Two times a day (BID) | ORAL | Status: DC
  Administered 2016-04-23 – 2016-04-24 (×2): 80 mg via ORAL
  Filled 2016-04-23: qty 1

## 2016-04-23 NOTE — Progress Notes (Signed)
Patient QT interval is .41.  Paged the MD to verify whether the Geodon should be administered as scheduled.  MD stated to administer the scheduled medication

## 2016-04-23 NOTE — Evaluation (Signed)
Physical Therapy Evaluation Patient Details Name: Tiffany [name] MRN: [id number] DOB: 07/23/1960 Today's Date: 04/23/2016   History of Present Illness  Tiffany [name] is a 56 y.o. female presenting with generalized muscle weakness found to have hypokalemia. PMH is significant for HFpEF (60-65% and G2DD), DM2 (controlled), HTN, OSA, COPD on 6L, HLD, Obesity, Bipolar DO, ? Hx of Seizure, hx of tobacco use, history of substance use (cocaine, opioid).   Clinical Impression  Pt admitted with above diagnosis. Pt currently with functional limitations due to the deficits listed below (see PT Problem List). Pt limited by SOB, however SpO2 >97% on 6LO2via . Suspect pt to be functioning near baseline.  Pt will benefit from skilled PT to increase their independence and safety with mobility to allow discharge to the venue listed below.       Follow Up Recommendations No PT follow up    Equipment Recommendations  None recommended by PT    Recommendations for Other Services       Precautions / Restrictions Precautions Precautions: Fall Precaution Comments: watch O2 sats Restrictions Weight Bearing Restrictions: No      Mobility  Bed Mobility               General bed mobility comments: pt up in chair  Transfers Overall transfer level: Needs assistance Equipment used: None Transfers: Sit to/from Stand Sit to Stand: Modified independent (Device/Increase time)         General transfer comment: pt with good technique  Ambulation/Gait Ambulation/Gait assistance: Supervision Ambulation Distance (Feet): 75 Feet Assistive device: None Gait Pattern/deviations: Step-through pattern;Wide base of support Gait velocity: fair Gait velocity interpretation: Below normal speed for age/gender General Gait Details: +SOB, pt reports i'm on 6LO2, pt O2 turned up to 6LO2, spo2 >97%  Stairs            Wheelchair Mobility    Modified Rankin (Stroke Patients Only)       Balance  Overall balance assessment: Needs assistance Sitting-balance support: No upper extremity supported;Feet supported Sitting balance-Leahy Scale: Good     Standing balance support: No upper extremity supported;During functional activity Standing balance-Leahy Scale: Good Standing balance comment: pt stood at sink and washed hands without difficulty                             Pertinent Vitals/Pain Pain Assessment: No/denies pain    Home Living Family/patient expects to be discharged to:: Private residence Living Arrangements: Children;Non-relatives/Friends Available Help at Discharge: Family;Available 24 hours/day Type of Home: House Home Access: Ramped entrance     Home Layout: One level Home Equipment: Grab bars - tub/shower;Cane - single point;Walker - 2 wheels;Bedside commode Additional Comments: home O2-6L    Prior Function Level of Independence: Independent with assistive device(s)               Hand Dominance   Dominant Hand: Right    Extremity/Trunk Assessment   Upper Extremity Assessment Upper Extremity Assessment: Overall WFL for tasks assessed    Lower Extremity Assessment Lower Extremity Assessment: Overall WFL for tasks assessed    Cervical / Trunk Assessment Cervical / Trunk Assessment: Normal  Communication   Communication: No difficulties  Cognition Arousal/Alertness: Awake/alert Behavior During Therapy: WFL for tasks assessed/performed Overall Cognitive Status: Within Functional Limits for tasks assessed  General Comments      Exercises     Assessment/Plan    PT Assessment Patient needs continued PT services  PT Problem List Decreased activity tolerance;Decreased balance;Decreased mobility;Obesity       PT Treatment Interventions Gait training;Functional mobility training;Therapeutic activities;Therapeutic exercise;Balance training    PT Goals (Current goals can be  found in the Care Plan section)  Acute Rehab PT Goals Patient Stated Goal: home independent PT Goal Formulation: With patient Time For Goal Achievement: 04/30/16 Potential to Achieve Goals: Good    Frequency Min 2X/week   Barriers to discharge        Co-evaluation               End of Session Equipment Utilized During Treatment: Oxygen Activity Tolerance: Patient tolerated treatment well Patient left: in chair;with call bell/phone within reach Nurse Communication: Mobility status PT Visit Diagnosis: History of falling (Z91.81)    Time: 2800-3491 PT Time Calculation (min) (ACUTE ONLY): 22 min   Charges:   PT Evaluation $PT Eval Low Complexity: 1 Procedure     PT G Codes:   PT G-Codes **NOT FOR INPATIENT CLASS** Functional Assessment Tool Used: Clinical judgement Functional Limitation: Mobility: Walking and moving around Mobility: Walking and Moving Around Current Status (P9150): At least 1 percent but less than 20 percent impaired, limited or restricted Mobility: Walking and Moving Around Goal Status [id number]): At least 1 percent but less than 20 percent impaired, limited or restricted    Kittie Plater, PT, DPT Pager #: [phone] Office #: [phone]   [place] 04/23/2016, 3:27 PM

## 2016-04-23 NOTE — Progress Notes (Signed)
Family Medicine Teaching Service Daily Progress Note Intern Pager: [phone]  Patient name: Tiffany [name] Medical record number: [id number] Date of birth: Sep 07, 1960 Age: 56 y.o. Gender: female  Primary Care Provider: Kathrine Cords, MD Consultants: none Code Status: full  Pt Overview and Major Events to Date:  4/6 - admit for hypokalemia  Assessment and Plan: Tiffany [name] is a 56 y.o. female presenting with generalized muscle weakness found to have hypokalemia. PMH is significant for HFpEF (60-65% and G2DD), DM2 (controlled), HTN, OSA, COPD on 6L, HLD, Obesity, Bipolar DO, ? Hx of Seizure, hx of tobacco use, history of substance use (cocaine, opioid).   Generalized Muscle Weakness 2/2 hypokalemia, resolved: likely due to loop diuretic. Neuro exam is non focal. K 2.5 > 2.8> 3.3, CK wnl, mag 2.4 - telemetry - PT/OT consult, appreciate recommendations - monitor RFP and continue to replete K as indicated: KDur 40 mEq BID  - recheck K this afternoon, if normalized can restart home lasix   AKI on CKD, resolved:  Difficult to assess baseline but seems to be ~1.3-1.4. At discharge from recent hospitalization Cr was 1.46. At clinic on 4/4 Cr 1.97. Possibly due to lasix. Now Cr 1.15. UA with mod leuks, no blood or protein or casts - monitor, avoid nephrotoxic medications - avoiding IVF for now, has good PO intake - holding home Gabapentin and Oxybutynin   Constipation: No abdominal pain or tenderness on exam - Miralax daily - may need suppository vs enema  HFpEF  HLD: no symptoms or signs of exacerbation, appears euvolemic and at dry weight. ECHO 03/2016 with 60-65% and G2DD, moderate concentric LV hypertrophy and mildly dilated cavity size. No wall motion abnormalities. Home Lasix 80mg  BID. Metolazone is on medication list but patient reports she is not taking this medication. - holding Lasix and Metolazone in the setting of hypokalemia, consider restart home lasix if K normalized this  afternoon - Continue home lipitor  COPD  OSA: On 6 L [place] throughout the day. No symptoms of exacerbation and on home O2. Followed by pulmonology.  - continue Breo, Spiriva, Singulair  - continue oxygen, currently on 5L   - CPAP at night   DM2: On Metformin at home. A1c 5.8 in 03/2016.  - will monitor CBG ACHS and add SSI if needed - restart home metformin today  Bipolar DO  Prolonged QTc: EKG with prolonged QTc at 543 on admit, improved to 490. Due to this wanted to hold Geodon, Remeron and Lexapro. Patient stated she would leave AMA if held, so continued. - Continue home meds - Repeat EKG 4/8   Normocytic Anemia, likely anemia of chronic disease: hgb stable. Baseline ~11 - monitor  Leukocytosis, resolved: no signs or symptoms of infection. Afebrile. No fevers/chills at home. Denies urinary symptoms, but UA with moderate leuks - monitor  - Urine culture pending  FEN/GI: heart healthy, carb modified, SLIV Prophylaxis: Lovenox  Disposition: pending PT/OT eval and recheck K in am, likely home 4/9  Subjective:  States her muscle weakness has resolved, can pick things up without shaking and was able to ambulate to the bathroom without issues.  No CP, SOB.  No abd pain.   Objective: Temp:  [97.9 F (36.6 C)-98.3 F (36.8 C)] 97.9 F (36.6 C) (04/08 0559) Pulse Rate:  [75-81] 75 (04/08 0559) Resp:  [16-20] 18 (04/08 0559) BP: (122-146)/(68-87) 146/86 (04/08 0559) SpO2:  [94 %-99 %] 96 % (04/08 0830) Weight:  [126.3 kg (278 lb 8 oz)] 126.3 kg (278  lb 8 oz) (04/08 0559) Physical Exam: General: NAD, obese Eyes: EMOI, PERRL Cardiovascular: RRR, systolic murmur noted in the LUSB. No LE edema Respiratory: On [place] 5L normal effort, decreased lung sounds at the bases likely due to body habitus, otherwise CTAB Gastrointestinal: Soft, nontender, nondistended, + bowel sounds, no organomegaly MSK: able to move all extremities  Derm: no rash  Neuro:  Awake, alert, no focal  deficits Psych: Mood and affect euthymic, normal rate and volume of speech  Laboratory:  Recent Labs Lab 04/21/16 1531 04/22/16 0121 04/23/16 0423  WBC 14.1* 11.7* 10.6*  HGB 11.9* 10.7* 10.4*  HCT 40.1 35.8* 34.5*  PLT 392 349 306    Recent Labs Lab 04/22/16 1017 04/22/16 2038 04/23/16 0423  NA 142 140 144  K 2.5* 2.8* 3.3*  CL 92* 94* 98*  CO2 38* 37* 34*  BUN 50* 40* 35*  CREATININE 1.45* 1.52* 1.15*  CALCIUM 9.1 9.2 8.9  GLUCOSE 128* 121* 85    EKG: NSR, QTc at 490   Imaging/Diagnostic Tests: No results found.  Bufford Lope, DO 04/23/2016, 8:59 AM PGY-1, [place] Intern pager: [phone], text pages welcome

## 2016-04-24 ENCOUNTER — Other Ambulatory Visit: Payer: Self-pay | Admitting: Pharmacist

## 2016-04-24 ENCOUNTER — Other Ambulatory Visit: Payer: Self-pay | Admitting: *Deleted

## 2016-04-24 DIAGNOSIS — F319 Bipolar disorder, unspecified: Secondary | ICD-10-CM

## 2016-04-24 DIAGNOSIS — R062 Wheezing: Secondary | ICD-10-CM | POA: Diagnosis not present

## 2016-04-24 DIAGNOSIS — G4733 Obstructive sleep apnea (adult) (pediatric): Secondary | ICD-10-CM | POA: Diagnosis not present

## 2016-04-24 DIAGNOSIS — J45909 Unspecified asthma, uncomplicated: Secondary | ICD-10-CM | POA: Diagnosis not present

## 2016-04-24 DIAGNOSIS — J440 Chronic obstructive pulmonary disease with acute lower respiratory infection: Secondary | ICD-10-CM | POA: Diagnosis not present

## 2016-04-24 DIAGNOSIS — R531 Weakness: Secondary | ICD-10-CM | POA: Diagnosis not present

## 2016-04-24 DIAGNOSIS — M1990 Unspecified osteoarthritis, unspecified site: Secondary | ICD-10-CM | POA: Diagnosis not present

## 2016-04-24 DIAGNOSIS — R9431 Abnormal electrocardiogram [ECG] [EKG]: Secondary | ICD-10-CM | POA: Diagnosis not present

## 2016-04-24 DIAGNOSIS — J189 Pneumonia, unspecified organism: Secondary | ICD-10-CM | POA: Diagnosis not present

## 2016-04-24 DIAGNOSIS — R0602 Shortness of breath: Secondary | ICD-10-CM | POA: Diagnosis not present

## 2016-04-24 DIAGNOSIS — E876 Hypokalemia: Secondary | ICD-10-CM | POA: Diagnosis not present

## 2016-04-24 DIAGNOSIS — J449 Chronic obstructive pulmonary disease, unspecified: Secondary | ICD-10-CM | POA: Diagnosis not present

## 2016-04-24 LAB — BASIC METABOLIC PANEL
Anion gap: 10 (ref 5–15)
BUN: 22 mg/dL — ABNORMAL HIGH (ref 6–20)
CO2: 32 mmol/L (ref 22–32)
Calcium: 9.4 mg/dL (ref 8.9–10.3)
Chloride: 100 mmol/L — ABNORMAL LOW (ref 101–111)
Creatinine, Ser: 1.08 mg/dL — ABNORMAL HIGH (ref 0.44–1.00)
GFR calc Af Amer: >60 mL/min (ref >60)
GFR calc non Af Amer: 56 mL/min — ABNORMAL LOW (ref >60)
Glucose, Bld: 88 mg/dL (ref 65–99)
Potassium: 3.7 mmol/L (ref 3.5–5.1)
Sodium: 142 mmol/L (ref 135–145)

## 2016-04-24 LAB — CBC
HCT: 36.1 % (ref 36.0–46.0)
Hemoglobin: 11.1 g/dL — ABNORMAL LOW (ref 12.0–15.0)
MCH: 28.2 pg (ref 26.0–34.0)
MCHC: 30.7 g/dL (ref 30.0–36.0)
MCV: 91.6 fL (ref 78.0–100.0)
Platelets: 323 10*3/uL (ref 150–400)
RBC: 3.94 MIL/uL (ref 3.87–5.11)
RDW: 15.8 % — ABNORMAL HIGH (ref 11.5–15.5)
WBC: 10.4 10*3/uL (ref 4.0–10.5)

## 2016-04-24 LAB — GLUCOSE, CAPILLARY
Glucose-Capillary: 83 mg/dL (ref 65–99)
Glucose-Capillary: 88 mg/dL (ref 65–99)

## 2016-04-24 MED ORDER — TRAMADOL HCL 50 MG PO TABS: 50.0000 mg | ORAL_TABLET | Freq: Two times a day (BID) | ORAL | 0 refills | Status: DC | PRN

## 2016-04-24 MED ORDER — POTASSIUM CHLORIDE CRYS ER 20 MEQ PO TBCR
40.0000 meq | EXTENDED_RELEASE_TABLET | Freq: Two times a day (BID) | ORAL | Status: DC
  Administered 2016-04-24: 40 meq via ORAL
  Filled 2016-04-24: qty 2

## 2016-04-24 MED ORDER — POTASSIUM CHLORIDE CRYS ER 20 MEQ PO TBCR: 40.0000 meq | EXTENDED_RELEASE_TABLET | Freq: Two times a day (BID) | ORAL | 0 refills | Status: DC

## 2016-04-24 NOTE — Progress Notes (Signed)
Family Medicine Teaching Service Daily Progress Note Intern Pager: [phone]  Patient name: Tiffany [name] Medical record number: [id number] Date of birth: 15-Dec-1960 Age: 56 y.o. Gender: female  Primary Care Provider: Kathrine Cords, MD Consultants: none Code Status: full  Pt Overview and Major Events to Date:  4/6 - admit for hypokalemia  Assessment and Plan: Tiffany [name] is a 56 y.o. female presenting with generalized muscle weakness found to have hypokalemia. PMH is significant for HFpEF (60-65% and G2DD), DM2 (controlled), HTN, OSA, COPD on 6L, HLD, Obesity, Bipolar DO, ? Hx of Seizure, hx of tobacco use, history of substance use (cocaine, opioid).   Generalized Muscle Weakness 2/2 hypokalemia, resolved: likely due to loop diuretic.  K 2.5 >>3.7 mag 2.4 - telemetry - PT/OT consult, appreciate recommendations - cont KDur 40 mEq BID  - restarted lasix now that K is WNL - consider spironolactone for K-sparing diuresis  AKI on CKD, resolved:  BL likely  ~1.3-1.4.Cr 1.08 today from 1.15 on admit - monitor, avoid nephrotoxic medications - holding home Gabapentin and Oxybutynin   Constipation, stable chronic - Miralax daily - may need suppository vs enema  HFpEF  HLD, stable: Euvolemic. Home Lasix 80mg  BID. Metolazone is on medication list but patient reports she is not taking this medication. - continue lasix - holding metolazone - Continue home lipitor  COPD  OSA, stable: 6 L [place] at home. Followed by pulmonology.  - continue Breo, Spiriva, Singulair  - continue oxygen   - CPAP at night   DM2, stable: On Metformin at home. A1c 5.8 in 03/2016.  - will monitor CBG ACHS and add SSI if needed - restart home metformin today  Bipolar DO  Prolonged QTc: EKG with prolonged QTc at 543 on admit, improved to 490. Due to this wanted to hold Geodon, Remeron and Lexapro. Patient stated she would leave AMA if held, so continued. - Continue home meds  Normocytic Anemia, likely  anemia of chronic disease: hgb stable. Baseline ~11 - monitor  Leukocytosis, resolved: no signs or symptoms of infection. Afebrile. No fevers/chills at home. Denies urinary symptoms, but UA with moderate leuks - monitor  - Urine culture pending  FEN/GI: heart healthy, carb modified, SLIV Prophylaxis: Lovenox  Disposition: pending PT/OT eval and recheck K in am, likely home 4/9  Subjective:  Patient doing well this AM with no acute events overnight. She states she would like to go home  Objective: Temp:  [97.9 F (36.6 C)-98.3 F (36.8 C)] 98.3 F (36.8 C) (04/09 0544) Pulse Rate:  [73-75] 74 (04/09 0544) Resp:  [16-20] 20 (04/09 0544) BP: (120-134)/(65-85) 134/85 (04/09 0544) SpO2:  [95 %-100 %] 95 % (04/09 0742) Weight:  [274 lb 3.2 oz (124.4 kg)] 274 lb 3.2 oz (124.4 kg) (04/09 0544) Physical Exam: General: NAD, obese Eyes: EMOI, PERRL Cardiovascular: RRR, no LE edema Respiratory: On [place] 4L normal effort, clear to auscultation bil with no W/R/R Gastrointestinal: Soft, nontender, nondistended, +bowel sounds, no organomegaly Neuro:  Awake, alert, no focal deficits Psych: Mood and affect euthymic, normal rate and volume of speech  Laboratory:  Recent Labs Lab 04/22/16 0121 04/23/16 0423 04/24/16 0530  WBC 11.7* 10.6* 10.4  HGB 10.7* 10.4* 11.1*  HCT 35.8* 34.5* 36.1  PLT 349 306 323    Recent Labs Lab 04/23/16 0423 04/23/16 1424 04/24/16 0530  NA 144 142 142  K 3.3* 3.6 3.7  CL 98* 101 100*  CO2 34* 34* 32  BUN 35* 27* 22*  CREATININE 1.15*  1.08* 1.08*  CALCIUM 8.9 9.3 9.4  GLUCOSE 85 121* 88    EKG: NSR, QTc at 490   Imaging/Diagnostic Tests: No results found.  Everrett Coombe, MD 04/24/2016, 9:46 AM PGY-1, [place] Intern pager: [phone], text pages welcome

## 2016-04-24 NOTE — Consult Note (Signed)
   [facility] CM Inpatient Consult   04/24/2016  Tiffany [name] Feb 19, 1960 [id number]   Alert by the [facility] Management team of the patient's re-admission.  Currently in observation.  Patient assessed for re-admission. Active consent on file.  Chart reveals the patient is Tiffany [name] a 56 y.o.femalepresenting with generalized muscle weakness found to have hypokalemia. PMH is significant for HFpEF (60-65% and G2DD), DM2 (controlled), HTN, OSA, COPD on 6L, HLD, Obesity, Bipolar DO, ? Hx of Seizure, hx of tobacco use, history of substance use (cocaine, opioid).  Met with the patient at the bedside.  Patient states she was having trouble with weakness and found that her potassium was low.  She states her breathing has been better and confirms home oxygen. She states she is anxious to get home. Asked the patient about getting her Advanced Directives done.  She states that it has not been done yet but if the doctors are going to let her go home, "I am not waiting around to get it done."  Was asked by [place] Worker, [name] to see if the patient's Advanced Directives could be done while in the hospital.  Spoke with the inpatient RNCM, to see if orders can be obtained and let her know that the patient was active with [facility]. Explained to the patient that [facility] Management does not interfere with or replace any services needed and arranged by the inpatient care management staff. Patient agrees to ongoing [facility] follow up.   For questions, please contact:  Natividad Brood, RN BSN [facility] Liaison  [phone] business mobile phone Toll free office [phone]

## 2016-04-24 NOTE — Patient Outreach (Signed)
[facility]) Care Management  04/24/2016  CYBELE MAULE [birth year] [id number]   [name] was scheduled to make an initial outreach attempt to patient today to perform phone assessment, as well as assess and assist with social work needs and services, but noted that patient remains hospitalized.  Patient was hospitalized on Friday, April 6th for Hypokalemia and is currently undergoing treatment.  [name] sent an [employer], via EPIC, to [place] and [place] with [facility], requesting that an order be placed for a chaplain consult, as patient is interested in initiating Advanced Directives (Living Will and [place] documents).  [name] is happy to assist with completion, but unable to notarize on an outpatient basis.  Documents can be completed, signed, witnesses, notarized and a copy can be placed in patient's chart, all while residing in the hospital.  [facility] will continue to follow patient to assist with discharge planning needs and services, as well as outreach to patient once discharged back into the community. [name], BSW, MSW, LCSW  Licensed Education officer, [facility]  Mailing [place] [address] [place], [place] [address] Physical Address-[address], [place], [place] [address] Toll Free Main # [phone] Fax # [phone] Cell # [phone]  Office # [phone] [email or web address]

## 2016-04-24 NOTE — Progress Notes (Signed)
   04/24/16 1406  Clinical Encounter Type  Visited With Patient  Visit Type Initial  Referral From Nurse  Consult/Referral To Chaplain  Stress Factors  Patient Stress Factors None identified  Advance Directives (For Healthcare)  Does Patient Have a Medical Advance Directive? No  Would patient like information on creating a medical advance directive? No - Patient declined  pt declined AD, mentioned that she is not interested.  Chaplain Josalin Carneiro A. Bufford Helms, MA-PC ,BA-REL/PHIL, [phone]

## 2016-04-24 NOTE — Patient Outreach (Signed)
[facility]) Care Management  04/24/2016  Tiffany [name] Sep 14, 1960 [id number]   Reviewed patient chart just before calling her.  Patient is currently hospitalized for hypokalemia. Drug therapy problem noted on 04/19/16--dose discrepancy-patient was only taking Potassium 10Meq daily versus 65mEq daily and was taking high dose furosemide and on metolazone.  Provider was alerted. Provider responded and called patient on 04/20/16.  I also attempted to call the patient on 04/20/16 but was unable to reach her.   Plan:  I will follow up with the patient when she is discharged.    Elayne Guerin, PharmD, [place] Clinical Pharmacist [phone]

## 2016-04-24 NOTE — Discharge Summary (Signed)
[facility] Discharge Summary  Patient name: Tiffany [name] Medical record number: [id number] Date of birth: [birth year] Age: 56 y.o. Gender: female Date of Admission: 04/21/2016  Date of Discharge: 04/24/2016 Admitting Physician: Tiffany Dawn, MD  Primary Care Provider: Kathrine Cords, MD Consultants: None  Indication for Hospitalization: Hypokalemia and Weakness  Discharge Diagnoses/Problem List:  Patient Active Problem List   Diagnosis Date Noted  . OSA and COPD overlap syndrome ([place])   . Generalized weakness   . Arthritis   . Bipolar I disorder ([place])   . Hypokalemia 04/21/2016  . Wheezing 04/12/2016  . Constipation   . (HFpEF) heart failure with preserved ejection fraction ([place])   . Benign essential HTN 03/22/2016  . Rectal bleeding 12/31/2015  . Bipolar affective disorder, mixed, severe, with psychotic behavior ([place]) 11/28/2015  . Cocaine use disorder, severe, dependence ([place]) 11/28/2015  . Hyponatremia 11/25/2015  . AKI (acute kidney injury) ([place]) 11/24/2015  . Blood in sputum   . Chronic diastolic heart failure ([place])   . SOB (shortness of breath)   . Acute on chronic respiratory failure with hypoxia and hypercapnia ([place]) 06/22/2015  . Onychomycosis 01/21/2015  . Nasal congestion 12/17/2014  . Tobacco use disorder 07/22/2014  . COPD (chronic obstructive pulmonary disease) ([place]) 07/08/2014  . COPD exacerbation ([place]) 06/26/2014  . Seborrheic keratoses 12/31/2013  . Thrush 09/19/2013  . Knee pain, bilateral 01/22/2013  . Seizure ([place]) 01/04/2013  . Health care maintenance 11/25/2012  . Folliculitis 94/76/5465  . History of kidney stones 06/18/2012  . Chronic pain syndrome 06/18/2012  . Muscle spasm 06/18/2012  . Elevated troponin 04/28/2012  . Asthma with acute exacerbation 04/28/2012  . Allergic reaction 04/07/2012  . Itching 09/06/2011  . HSV infection 08/30/2011  . Dyslipidemia 04/24/2011  . Anemia 04/24/2011  . Diabetic neuropathy  ([place]) 04/24/2011  . Obstructive sleep apnea 10/18/2010  . Asthma 10/18/2010  . Morbid obesity ([place]) 10/18/2010  . Type 2 diabetes mellitus ([place]) 10/18/2010    Disposition: Home  Discharge Condition: Stable/Improved  Discharge Exam:  Temp:  [97.9 F (36.6 C)-98.3 F (36.8 C)] 98.3 F (36.8 C) (04/09 0544) Pulse Rate:  [73-75] 74 (04/09 0544) Resp:  [16-20] 20 (04/09 0544) BP: (120-134)/(65-85) 134/85 (04/09 0544) SpO2:  [95 %-100 %] 95 % (04/09 0742) Weight:  [274 lb 3.2 oz (124.4 kg)] 274 lb 3.2 oz (124.4 kg) (04/09 0544) Physical Exam: General: NAD, obese Eyes: EMOI, PERRL Cardiovascular: RRR, no LE edema Respiratory: On [place] 4L normal effort, clear to auscultation bil with no W/R/R Gastrointestinal: Soft, nontender, nondistended, +bowel sounds, no organomegaly Neuro: Awake, alert, no focal deficits Psych: Mood and affect euthymic, normal rate and volume of speech  Brief Hospital Course:  Tiffany Sanjurjo McElrathis a 56 y.o.femalepresenting with generalized muscle weakness found to have hypokalemia. PMH is significant for HFpEF (60-65% and G2DD), DM2 (controlled), HTN, OSA, COPD on 6L, HLD, Obesity, Bipolar DO, ? Hx of Seizure, hx of tobacco use, history of substance use (cocaine, opioid).   Hypokalemia Weakness and hypokalemia resolved with supplementation and holding home lasix. Patient remained euvolemic. Once potassium was within normal limits lasix were restarted. The patient was monitored overnight and potassium remained within normal limits. Patient was discharged with 40 mg KDUR BID until hospital follow up appointment. She had been listed as having metolazone however was unaware of this medication and it was not continued at discharge  Prolonged QTc Noted on admission, Qtc prolonged at 543. Patient stated that if her psych medications (  Geodon) were held she would leave AMA. Risks were discussed with patient. Geodon was continued.  QTc improved.  Arthritis (Chronic) For  arthritis pain, patient endorsed using naproxen which is not appropriate given her kidney disease. This has been an ongoing discussion with patient and PCP, see below.  AKI Patient had an AKI on admission which resolved prior to discharge.  Chronic Problems The rest of the patient's chronic medical issues including HFpEF, HLD, COPD, T2DM were stable throughout her admission.  Issues for Follow Up:  1. Hypokalemia  - Please recheck BMP at follow up. Patient placed on 40 mg KDUR BID until follow-up, please reassess potassium and adjust this dosea s appropriate 2. Consider potassium-sparing diuretic such as spironolactone 3. Metolazone was on medication list but patient stated she was not taking it. This was not ordered at discharge 4. Arthritis - Patient states taking naproxen at home. This was discouraged by PCP prior to admission and stopped on admission. Patient advised to discontinue Naproxen at discharge.  1. Discussed with PCP, started low dose tramadol for short term pain management. PCP working with psychiatry to see if cymbalta can be used for her pain as well. Patient understands tramadol is short term until better pain regimen may be worked out. 5. Amlodipine Was held at discharge due to pressures being well controlled and heart failure patient. Please reassess and restart if appropriate at follow up.  Significant Procedures: None  Significant Labs and Imaging:   Recent Labs Lab 04/22/16 0121 04/23/16 0423 04/24/16 0530  WBC 11.7* 10.6* 10.4  HGB 10.7* 10.4* 11.1*  HCT 35.8* 34.5* 36.1  PLT 349 306 323    Recent Labs Lab 04/22/16 0120 04/22/16 1017 04/22/16 2038 04/23/16 0423 04/23/16 1424 04/24/16 0530  NA 143 142 140 144 142 142  K 2.6* 2.5* 2.8* 3.3* 3.6 3.7  CL 91* 92* 94* 98* 101 100*  CO2 38* 38* 37* 34* 34* 32  GLUCOSE 98 128* 121* 85 121* 88  BUN 57* 50* 40* 35* 27* 22*  CREATININE 1.76* 1.45* 1.52* 1.15* 1.08* 1.08*  CALCIUM 9.2 9.1 9.2 8.9 9.3 9.4  MG  2.3  --   --  2.4  --   --   PHOS  --  2.3*  --   --   --   --   ALBUMIN  --  3.2*  --   --   --   --       Results/Tests Pending at Time of Discharge: None  Discharge Medications:  Allergies as of 04/24/2016      Reactions   Hydroxyzine Shortness Of Breath   -throat closed up   Codeine Nausea And Vomiting   Sulfa Antibiotics Itching      Medication List    STOP taking these medications   amLODipine 5 MG tablet Commonly known as:  NORVASC   metolazone 2.5 MG tablet Commonly known as:  ZAROXOLYN   naproxen 500 MG tablet Commonly known as:  NAPROSYN     TAKE these medications   ACCU-CHEK AVIVA PLUS w/Device Kit CHECK BLOOD SUGAR THREE TIMES DAILY   acetaminophen 650 MG CR tablet Commonly known as:  TYLENOL 8 HOUR Take 1 tablet (650 mg total) by mouth every 8 (eight) hours as needed for pain.   albuterol 108 (90 Base) MCG/ACT inhaler Commonly known as:  PROVENTIL HFA;VENTOLIN HFA Inhale 2 puffs into the lungs every 6 (six) hours as needed. What changed:  reasons to take this   albuterol (2.5 MG/3ML)  0.083% nebulizer solution Commonly known as:  PROVENTIL Take 3 mLs (2.5 mg total) by nebulization every 6 (six) hours as needed for wheezing or shortness of breath. What changed:  Another medication with the same name was changed. Make sure you understand how and when to take each.   aspirin 81 MG EC tablet Take 1 tablet (81 mg total) by mouth daily. Swallow whole.   atorvastatin 40 MG tablet Commonly known as:  LIPITOR Take 1 tablet (40 mg total) by mouth daily.   B-D SINGLE USE SWABS REGULAR Pads CHECK CAPILLARY BLOOD GLUCOSE ONE TIME DAILY   benztropine 0.5 MG tablet Commonly known as:  COGENTIN Take 1 tablet (0.5 mg total) by mouth daily.   BREO ELLIPTA 100-25 MCG/INH Aepb Generic drug:  fluticasone furoate-vilanterol INHALE 1 PUFF EVERY DAY   divalproex 500 MG 24 hr tablet Commonly known as:  [name] ER Take 1 tablet (500 mg total) by mouth  daily. What changed:  how much to take  when to take this   EQL NICOTINE 21 mg/24hr patch Generic drug:  nicotine Place 21 mg onto the skin daily.   escitalopram 20 MG tablet Commonly known as:  LEXAPRO Take 1 tablet (20 mg total) by mouth daily. What changed:  when to take this   furosemide 40 MG tablet Commonly known as:  LASIX Take 2 tablets (80 mg total) by mouth 2 (two) times daily.   gabapentin 600 MG tablet Commonly known as:  NEURONTIN Take 1 tablet (600 mg total) by mouth 3 (three) times daily.   glucose blood test strip Use to check blood sugar 3 times daily.   metFORMIN 500 MG 24 hr tablet Commonly known as:  GLUCOPHAGE-XR TAKE 1 TABLET TWICE DAILY   mirtazapine 30 MG tablet Commonly known as:  REMERON Take 1 tablet (30 mg total) by mouth at bedtime.   montelukast 10 MG tablet Commonly known as:  SINGULAIR TAKE 1 TABLET AT BEDTIME What changed:  See the new instructions.   multivitamin with minerals tablet Take 1 tablet by mouth daily. Reported on 07/28/2015   omeprazole 40 MG capsule Commonly known as:  PRILOSEC Take 1 capsule (40 mg total) by mouth daily. What changed:  when to take this   OXYGEN Inhale 6 L into the lungs daily.   potassium chloride SA 20 MEQ tablet Commonly known as:  K-DUR,KLOR-CON Take 2 tablets (40 mEq total) by mouth 2 (two) times daily. What changed:  medication strength  how much to take  when to take this   [place] into the lungs at bedtime. CPAP   tiotropium 18 MCG inhalation capsule Commonly known as:  SPIRIVA HANDIHALER Place 1 capsule (18 mcg total) into inhaler and inhale daily.   traMADol 50 MG tablet Commonly known as:  ULTRAM Take 1 tablet (50 mg total) by mouth every 12 (twelve) hours as needed for moderate pain or severe pain.   ziprasidone 80 MG capsule Commonly known as:  GEODON Take 1 capsule (80 mg total) by mouth at bedtime.       Discharge Instructions: Please  refer to Patient Instructions section of EMR for full details.  Patient was counseled important signs and symptoms that should prompt return to medical care, changes in medications, dietary instructions, activity restrictions, and follow up appointments.  Follow-Up Appointments: Follow-up Information    Tiffany Cords, MD Follow up on 05/01/2016.   Specialty:  Family Medicine Why:  Please go to your hospital follow up appointment with Dr.  Dorsey at 1:45 PM Contact information: [address] [address] [phone]           Everrett Coombe, MD 04/24/2016, 12:02 PM PGY-1, [place]

## 2016-04-24 NOTE — Progress Notes (Signed)
Tiffany [name] to be D/C'd Home per MD order.  Discussed with the patient and all questions fully answered.  VSS, Skin clean, dry and intact without evidence of skin break down, no evidence of skin tears noted. IV catheter discontinued intact. Site without signs and symptoms of complications. Dressing and pressure applied.  An After Visit Summary was printed and given to the patient. Patient received prescription.  D/c education completed with patient/family including follow up instructions, medication list, d/c activities limitations if indicated, with other d/c instructions as indicated by MD - patient able to verbalize understanding, all questions fully answered.   Patient instructed to return to ED, call 911, or call MD for any changes in condition.   Patient escorted via [place], and D/C home via private auto.  Dorris Carnes 04/24/2016 2:30 PM

## 2016-04-24 NOTE — Patient Outreach (Signed)
[place]) Care Management  04/24/2016  Tiffany [name] [birth year] [id number]   Noted that member was readmitted to hospital over the weekend.  Hospital liaisons notified, will follow up after discharge.  [name], [place], MSN [place] [phone]

## 2016-04-24 NOTE — Discharge Instructions (Signed)
You were hospitalized with low potassium. It is important that you continue to take the potassium supplement, KDUR twice daily for the next 7 days until your hospital follow up.  At your hospital follow up this medication may change. - Please attend your hospital follow up. - Please review your discharge medication list closely - Please discontinue use of Naproxen as this is bad for your kidneys. For arthritis pain, you were given a small amount of a medication called Tramadol. This is a short term medication while a pain regimen is worked out for you.  Our clinic's number is [phone]. Please call with questions or concerns about what we discussed today.

## 2016-04-25 ENCOUNTER — Other Ambulatory Visit: Payer: Self-pay | Admitting: *Deleted

## 2016-04-25 ENCOUNTER — Encounter: Payer: Self-pay | Admitting: *Deleted

## 2016-04-25 NOTE — Patient Outreach (Signed)
[name]) Care Management  04/25/2016  [name] 28-Jul-1960 [id number]   [name] was able to make initial contact with patient today to perform phone assessment, as well as assess and assist with social work needs and services.  [name] introduced self, explained role and types of services provided through [place] [facility] ([place] [facility]).  [name] further explained to patient that [name] works with patient's RNCM, also with [place] [facility], [name]. [name] then explained the reason for the call, indicating that Mrs. Tiffany [name] thought that patient would benefit from social work services and resources to assist with completing her Financial controller ([place] documents).  [name] obtained two HIPAA compliant identifiers from patient, which included patient's name and date of birth. Patient admitted that she is still interested in completing the documents, just was not feeling "up to it" while hospitalized yesterday.  [name] agreed to schedule a home visit with patient to assist with completion.  Patient did not have her calendar in front of her at the time; therefore, unable to schedule a time with [name] while on the phone.  Patient agreed to contact [name] when she returning home so that [name] can schedule a home visit.  [name] will suggest that she and patient meet on Monday, April 16th at 10:30am. [name], BSW, MSW, [place]  Licensed Clinical Social Worker  [place]  Mailing [address], [place], [place] [address] Physical Address-[address], [place], [place] [address] Toll Free Main # [phone] Fax # [phone] Cell # [phone]  Office # [phone] [email or web address]

## 2016-04-25 NOTE — Patient Outreach (Signed)
[facility] [facility]) Care Management  04/25/2016  Tiffany [name] 30-Mar-1960 [id number]   Member discharged from hospital yesterday, 4/9, after being readmitted over the weekend.  Call placed to restart transition of care program.  She state that she was recently admitted for congestive heart failure, Lasix was adjusted.  She report that this recent admission was due to her potassium level being low.  She state that her potassium dose was also adjusted with this discharge.  She state that she is "doing ok" since discharge, unable to complete transition of care assessment as she is currently on the way to take her son to an appointment.  She was originally scheduled for home visit tomorrow with another care manager prior to reassignment, appointment canceled.  Home visit rescheduled for next week.  Will complete assessment and medication review at that time, denies any urgent questions/concerns, report that she has all medication on her list and taking them correctly.  Tiffany [name], [place], MSN [place] [phone]

## 2016-04-26 ENCOUNTER — Ambulatory Visit: Payer: Self-pay

## 2016-04-26 ENCOUNTER — Telehealth: Payer: Self-pay | Admitting: Family Medicine

## 2016-04-26 NOTE — Telephone Encounter (Signed)
Called to see if pt could come to [facility] clinic on 4/12 at 3pm, she states she'd be able to do this.  Archie Patten, MD [facility] Family Medicine Resident  04/26/2016, 1:00 PM

## 2016-04-27 ENCOUNTER — Telehealth (HOSPITAL_COMMUNITY): Payer: Self-pay

## 2016-04-27 ENCOUNTER — Encounter: Payer: Self-pay | Admitting: Family Medicine

## 2016-04-27 ENCOUNTER — Ambulatory Visit (INDEPENDENT_AMBULATORY_CARE_PROVIDER_SITE_OTHER): Payer: Medicare HMO | Admitting: Family Medicine

## 2016-04-27 ENCOUNTER — Other Ambulatory Visit: Payer: Self-pay | Admitting: *Deleted

## 2016-04-27 VITALS — BP 136/72 | HR 86 | Temp 98.0°F | Wt 291.2 lb

## 2016-04-27 DIAGNOSIS — I5030 Unspecified diastolic (congestive) heart failure: Secondary | ICD-10-CM

## 2016-04-27 DIAGNOSIS — E669 Obesity, unspecified: Secondary | ICD-10-CM | POA: Diagnosis not present

## 2016-04-27 DIAGNOSIS — G894 Chronic pain syndrome: Secondary | ICD-10-CM | POA: Diagnosis not present

## 2016-04-27 DIAGNOSIS — IMO0001 Reserved for inherently not codable concepts without codable children: Secondary | ICD-10-CM

## 2016-04-27 DIAGNOSIS — Z6841 Body Mass Index (BMI) 40.0 and over, adult: Secondary | ICD-10-CM

## 2016-04-27 DIAGNOSIS — I10 Essential (primary) hypertension: Secondary | ICD-10-CM | POA: Diagnosis not present

## 2016-04-27 MED ORDER — ACETAMINOPHEN ER 650 MG PO TBCR: 650.0000 mg | EXTENDED_RELEASE_TABLET | Freq: Three times a day (TID) | ORAL | 0 refills | Status: DC | PRN

## 2016-04-27 NOTE — Assessment & Plan Note (Addendum)
Discussed not taking so much Tylenol. Renal function improved. Pt to take 1 naproxen 500mg  with her first dose of Tylenol. She can take Tylenol PRN q8hrs. - pt's psychiatrist out of town, his nurse was going to contact me once he gets back into town about cymbalta, etc  - will check renal function at next visit.

## 2016-04-27 NOTE — Assessment & Plan Note (Signed)
Pt to practice mindful eating. Discussed what an appropriate meal should consist of. Will assess if she is truly hungry after 1st serving, will go for seconds only after waiting 20 minutes. Referral to nutrition.

## 2016-04-27 NOTE — Telephone Encounter (Signed)
Cogentin was reduced because of dry mouth.  If she increase Cogentin to 1 mg it may help the tremors but she has more risk to fall, dry mouth and constipation.

## 2016-04-27 NOTE — Assessment & Plan Note (Signed)
Pt still taking amlodipine, this was stopped on D/c as she was already on one CCB. Advised pt to stop this med. Continue others.

## 2016-04-27 NOTE — Patient Outreach (Signed)
[place] [facility]) Care Management  04/27/2016  DANEISHA SURGES 05-17-1960 [id number]   Voice message received from member stating that she will need to reschedule next week's home visit due to a conflict in her schedule.  Call placed back to member, visit rescheduled from Tuesday to Thursday.  Will complete transition of care assessment as well as medication review and care plan at that time.  She has follow up with primary MD today.  [name], [place], MSN [place] [phone]

## 2016-04-27 NOTE — Telephone Encounter (Signed)
Medication problem - Patient called stating since she had the decrease in Cogentin back to 0.5 mg her hands and limbs are shaking more.  Would like to know if should increase back to 1mg  or any other changes as was shaking some with 1mg  too.  States she recently got out of the hospital after falling due to shaking was worse and would like to know what Dr. [name] recommends.  Patient does not return for next evaluation until 06/29/16. Agreed to send information to Dr. [name] and would contact patient back once discussed.

## 2016-04-27 NOTE — Assessment & Plan Note (Signed)
Does not appear volume overloaded on exam, however wt 291 here, was 274 in the hospital a few days ago. Asymptomatic. Advised pt to weigh herself every AM. - continue current regimen, pt to call us with increased weight suddenly - f/u next week with me.

## 2016-04-27 NOTE — Progress Notes (Signed)
Subjective: CC: complex care coordination: pain, breathing, med rec, smoking HPI: Patient is a 56 y.o. female with a past medical history of substance abuse-cocaine, CK-MB 2, diastolic heart failure, COPD, type 2 diabetes, bipolar disorder presenting to clinic today for follow-up on pain, breathing, and recent hospitalization.  The patient recently had significant hypoxia and was hospitalized for a CHF exacerbation and also treated for a COPD exacerbation. [name] on baseline 6 L nasal cannula. She was then hospitalized for hypokalemia with significant weakness. She feels much better now. [name] breathing comfortably. Her oxygen saturation stays between 90 and 95% on 6 L nasal cannula. She denies lower extremity swelling, cough, or SOB. No orthopnea or PND. Taking medications as prescribed.  [name] very confused about her medications and would like to go over these. This was one of the goals I have for [name] today.  Her other goals include to stay drug and tobacco free and to lose weight. She notes she would be interested in gastric bypass surgery in the future. One goal I had for today was to address her pain. The patient, unfortunately, did not bring her medication bottles. We did do a reconsideration of our medication list versus the medication list she had from her [facility] pharmacist.  Pain: The patient notes significant arthralgias all over. She states one area does not predominate. She states that she was taking acetaminophen 650 mg I prescribed without improvement, in fact she was taking 2 tablets 3 times a day without improvement. She started taking back naproxen at one point, which seemed to resolve the pain completely. This is an issue as her renal function has fluctuated. On discharge, she was started on tramadol with the suggestion that it would be a short-term medication. She states that the tramadol has not been helping. She's had a take the tramadol in addition to Tylenol in order to feel any  effect.  Drug use: The patient has stopped using cocaine and denies use since February 2018. She never went to counseling, but she is seen her psychiatric counselor and goes to [facility].  Tobacco use: The patient stopped smoking on 04/10/2016 when she was previously hospitalized with hypoxia. She states she still has urges to smoke, but she states that the reason she will not smoke again on her determination, it "is against my health" and she has good support system. She states if she thinks about smoking, she discusses this with her mother, her NA sponsor, or her son.   Weight loss: The patient would like to lose weight. She mentions that she would like to have gastric bypass surgery. We discussed that this is not without risk given her other comorbidities and typically we like to try other modalities prior to surgery. I asked what other options she could do for weight loss. She notes she was eating a heart healthy diet in the hospital and seemed to do very well with this. After discharge, she ate very poorly and has gained weight since. She notes overeating is a big issue. She often eats, despite not feeling hungry.  [name] not able to exercise much.   Social History:  Has 1 son, [name] Lives with son and his boyfriend Her house has ramps and handrails should she ever needs them.  Her mother lives down the street from her and is a good support person in addition to her son. She drives herself, has private transportation.  Cocaine free since 02/24/16, smoke free since 04/10/16  ROS: All other systems reviewed and are negative.  Past Medical History Patient Active Problem List   Diagnosis Date Noted  . QT prolongation   . OSA and COPD overlap syndrome ([place])   . Generalized weakness   . Arthritis   . Bipolar I disorder ([place])   . Hypokalemia 04/21/2016  . Wheezing 04/12/2016  . (HFpEF) heart failure with preserved ejection fraction ([place])   . Benign essential HTN 03/22/2016  . Rectal  bleeding 12/31/2015  . Bipolar affective disorder, mixed, severe, with psychotic behavior ([place]) 11/28/2015  . Cocaine use disorder, severe, dependence ([place]) 11/28/2015  . Hyponatremia 11/25/2015  . AKI (acute kidney injury) ([place]) 11/24/2015  . Blood in sputum   . Chronic diastolic heart failure ([place])   . SOB (shortness of breath)   . Acute on chronic respiratory failure with hypoxia and hypercapnia ([place]) 06/22/2015  . Onychomycosis 01/21/2015  . Nasal congestion 12/17/2014  . Tobacco use disorder 07/22/2014  . COPD (chronic obstructive pulmonary disease) ([place]) 07/08/2014  . COPD exacerbation ([place]) 06/26/2014  . Seborrheic keratoses 12/31/2013  . Knee pain, bilateral 01/22/2013  . Seizure ([place]) 01/04/2013  . Health care maintenance 11/25/2012  . Folliculitis 98/92/1194  . History of kidney stones 06/18/2012  . Chronic pain syndrome 06/18/2012  . Muscle spasm 06/18/2012  . Elevated troponin 04/28/2012  . Asthma with acute exacerbation 04/28/2012  . Allergic reaction 04/07/2012  . Itching 09/06/2011  . HSV infection 08/30/2011  . Dyslipidemia 04/24/2011  . Anemia 04/24/2011  . Diabetic neuropathy ([place]) 04/24/2011  . Obstructive sleep apnea 10/18/2010  . Asthma 10/18/2010  . Morbid obesity ([place]) 10/18/2010  . Type 2 diabetes mellitus ([place]) 10/18/2010    Medications- reviewed and updated Current Outpatient Prescriptions  Medication Sig Dispense Refill  . acetaminophen (TYLENOL 8 HOUR) 650 MG CR tablet Take 1 tablet (650 mg total) by mouth every 8 (eight) hours as needed for pain. 60 tablet 0  . albuterol (PROVENTIL HFA;VENTOLIN HFA) 108 (90 Base) MCG/ACT inhaler Inhale 2 puffs into the lungs every 6 (six) hours as needed. (Patient taking differently: Inhale 2 puffs into the lungs every 6 (six) hours as needed for wheezing or shortness of breath. ) 1 Inhaler 6  . albuterol (PROVENTIL) (2.5 MG/3ML) 0.083% nebulizer solution Take 3 mLs (2.5 mg total) by nebulization every 6 (six)  hours as needed for wheezing or shortness of breath. 150 mL 1  . Alcohol Swabs (B-D SINGLE USE SWABS REGULAR) PADS CHECK CAPILLARY BLOOD GLUCOSE ONE TIME DAILY 100 each 3  . aspirin 81 MG EC tablet Take 1 tablet (81 mg total) by mouth daily. Swallow whole. 30 tablet 1  . atorvastatin (LIPITOR) 40 MG tablet Take 1 tablet (40 mg total) by mouth daily. 30 tablet 1  . benztropine (COGENTIN) 0.5 MG tablet Take 1 tablet (0.5 mg total) by mouth daily. 30 tablet 2  . Blood Glucose Monitoring Suppl (ACCU-CHEK AVIVA PLUS) w/Device KIT CHECK BLOOD SUGAR THREE TIMES DAILY 1 kit 0  . BREO ELLIPTA 100-25 MCG/INH AEPB INHALE 1 PUFF EVERY DAY 180 each 0  . divalproex ([name] ER) 500 MG 24 hr tablet Take 1 tablet (500 mg total) by mouth daily. (Patient taking differently: Take 1,000 mg by mouth at bedtime. ) 30 tablet 2  . escitalopram (LEXAPRO) 20 MG tablet Take 1 tablet (20 mg total) by mouth daily. (Patient taking differently: Take 20 mg by mouth at bedtime. ) 30 tablet 2  . furosemide (LASIX) 40 MG tablet Take 2 tablets (80  mg total) by mouth 2 (two) times daily. 120 tablet 0  . gabapentin (NEURONTIN) 600 MG tablet Take 1 tablet (600 mg total) by mouth 3 (three) times daily. 90 tablet 1  . glucose blood test strip Use to check blood sugar 3 times daily. 100 each 12  . metFORMIN (GLUCOPHAGE-XR) 500 MG 24 hr tablet TAKE 1 TABLET TWICE DAILY 180 tablet 0  . mirtazapine (REMERON) 30 MG tablet Take 1 tablet (30 mg total) by mouth at bedtime. 30 tablet 2  . montelukast (SINGULAIR) 10 MG tablet TAKE 1 TABLET AT BEDTIME 90 tablet 3  . Multiple Vitamins-Minerals (MULTIVITAMIN WITH MINERALS) tablet Take 1 tablet by mouth daily. Reported on 07/28/2015 30 tablet 1  . nicotine (EQL NICOTINE) 21 mg/24hr patch Place 21 mg onto the skin daily.    [name] omeprazole (PRILOSEC) 40 MG capsule Take 1 capsule (40 mg total) by mouth daily. (Patient taking differently: Take 40 mg by mouth 2 (two) times daily. ) 30 capsule 1  . OXYGEN  Inhale 6 L into the lungs daily.    . potassium chloride SA (K-DUR,KLOR-CON) 20 MEQ tablet Take 2 tablets (40 mEq total) by mouth 2 (two) times daily. 28 tablet 0  . PRESCRIPTION MEDICATION Inhale into the lungs at bedtime. CPAP    . tiotropium (SPIRIVA HANDIHALER) 18 MCG inhalation capsule Place 1 capsule (18 mcg total) into inhaler and inhale daily. 90 capsule 1  . traMADol (ULTRAM) 50 MG tablet Take 1 tablet (50 mg total) by mouth every 12 (twelve) hours as needed for moderate pain or severe pain. 30 tablet 0  . ziprasidone (GEODON) 80 MG capsule Take 1 capsule (80 mg total) by mouth at bedtime. 30 capsule 2   No current facility-administered medications for this visit.     Objective: Office vital signs reviewed. BP 136/72   Pulse 86   Temp 98 F (36.7 C) (Oral)   Wt 291 lb 3.2 oz (132.1 kg)   SpO2 94%   BMI 49.98 kg/m    Physical Examination:  General: Awake, alert, well- nourished, NAD Neck: no JVD Cardio: RRR, no m/r/g noted. No pitting edema. Pulm: No increased WOB.  CTAB, without wheezes, rhonchi or crackles noted. [place] in place.  Assessment/Plan: Chronic pain syndrome Discussed not taking so much Tylenol. Renal function improved. Pt to take 1 naproxen 565m with her first dose of Tylenol. She can take Tylenol PRN q8hrs. - pt's psychiatrist out of town, his nurse was going to contact me once he gets back into town about cymbalta, etc  - will check renal function at next visit.  (HFpEF) heart failure with preserved ejection fraction (HCC) Does not appear volume overloaded on exam, however wt 291 here, was 274 in the hospital a few days ago. Asymptomatic. Advised pt to weigh herself every AM. - continue current regimen, [place] - f/u next week with me.  Benign essential HTN Pt still taking amlodipine, this was stopped on D/c as she was already on one CCB. Advised pt to stop this med. Continue others.  Morbid obesity ([employer] [employer] Pt to  practice mindful eating. Discussed what an appropriate meal should consist of. Will assess if she is truly hungry after 1st serving, will go for seconds only after waiting 20 minutes. Referral to nutrition.    Orders Placed This Encounter  Procedures  . Amb ref to Medical Nutrition Therapy-MNT    Referral Priority:   Routine    Referral Type:  Consultation    Referral Reason:   Specialty Services Required    Requested Specialty:   Nutrition    Number of Visits Requested:   1    Meds ordered this encounter  Medications  . acetaminophen (TYLENOL 8 HOUR) 650 MG CR tablet    Sig: Take 1 tablet (650 mg total) by mouth every 8 (eight) hours as needed for pain.    Dispense:  60 tablet    Refill:  [place] PGY-3, [place]

## 2016-04-27 NOTE — Patient Instructions (Addendum)
1) Weigh yourself daily at the same time.  - if you're gaining weight quickly (more than 4 pounds in a  day, please call your cardiologist)  2) Great job on stopping cocaine and smoking!   3) After 1 serving of food, stop and think about whether you are still hungry- try to give your body 20 minutes before you go for a second helping.   4) Take acetaminophen 650mg  that I prescribed every 8 hours as needed with pain. With the first dose of acetaminophen, take 1 naproxen 500mg    5) try to bring all you medications in.

## 2016-04-28 ENCOUNTER — Other Ambulatory Visit: Payer: Self-pay | Admitting: Pharmacist

## 2016-04-28 ENCOUNTER — Encounter: Payer: Self-pay | Admitting: Pharmacist

## 2016-04-28 NOTE — Patient Outreach (Signed)
[facility]) Care Management  04/28/2016  Tiffany [name] 10-Jan-1961 [id number]   Patient was called to review medications post discharge and her most recent PCP visit. HIPAA identifiers were obtained.  Patient said she was out and about and could not talk but was very concerned about getting her pill box filled because her medications changed and she was very confused.  An acute home visit was scheduled with the patient today at 3:30pm to assist with organizing her medications.  Elayne Guerin, PharmD, [place] Clinical Pharmacist [phone]

## 2016-04-28 NOTE — Patient Outreach (Signed)
[facility]) Care Management  [place]   04/28/2016  Tiffany [name] [birth year] [id number]  Subjective: Acute home visit completed at patient's home due to recent hospital discharge and post hospital physician appointment.  HIPAA identifiers were obtained. Patient is a 56 year old female with multiple medical conditions including but not limited to:  CHF, hypertension, COPD, type 2 diabetes, hyperlipidemia, pulmonary hypertension, and history of tobacco and narcotic abuse.  Patient was hospitalized 04/10/16-04/15/16 for CHF exacerbation and 04/21/16 for hypokalemia.  Patient reported feeling "pretty good" today but was concerned about her medication box because several of her medications were changed or discontinued from the hospital and her physician.    Objective:   Encounter Medications: Outpatient Encounter Prescriptions as of 04/28/2016  Medication Sig Note  . acetaminophen (TYLENOL 8 HOUR) 650 MG CR tablet Take 1 tablet (650 mg total) by mouth every 8 (eight) hours as needed for pain.   [name] albuterol (PROVENTIL HFA;VENTOLIN HFA) 108 (90 Base) MCG/ACT inhaler Inhale 2 puffs into the lungs every 6 (six) hours as needed. (Patient taking differently: Inhale 2 puffs into the lungs every 6 (six) hours as needed for wheezing or shortness of breath. )   . albuterol (PROVENTIL) (2.5 MG/3ML) 0.083% nebulizer solution Take 3 mLs (2.5 mg total) by nebulization every 6 (six) hours as needed for wheezing or shortness of breath.   . Alcohol Swabs (B-D SINGLE USE SWABS REGULAR) PADS CHECK CAPILLARY BLOOD GLUCOSE ONE TIME DAILY   . aspirin 81 MG EC tablet Take 1 tablet (81 mg total) by mouth daily. Swallow whole.   [name] atorvastatin (LIPITOR) 40 MG tablet Take 1 tablet (40 mg total) by mouth daily.   . benztropine (COGENTIN) 0.5 MG tablet Take 1 tablet (0.5 mg total) by mouth daily.   . Blood Glucose Monitoring Suppl (ACCU-CHEK AVIVA PLUS) w/Device KIT CHECK BLOOD SUGAR THREE TIMES  DAILY   . BREO ELLIPTA 100-25 MCG/INH AEPB INHALE 1 PUFF EVERY DAY   . divalproex ([name] ER) 500 MG 24 hr tablet Take 1 tablet (500 mg total) by mouth daily. (Patient taking differently: Take 500 mg by mouth at bedtime. )   . escitalopram (LEXAPRO) 20 MG tablet Take 1 tablet (20 mg total) by mouth daily.   . furosemide (LASIX) 40 MG tablet Take 2 tablets (80 mg total) by mouth 2 (two) times daily.   [name] gabapentin (NEURONTIN) 600 MG tablet Take 1 tablet (600 mg total) by mouth 3 (three) times daily.   [name] glucose blood test strip Use to check blood sugar 3 times daily.   . metFORMIN (GLUCOPHAGE-XR) 500 MG 24 hr tablet TAKE 1 TABLET TWICE DAILY   . mirtazapine (REMERON) 30 MG tablet Take 1 tablet (30 mg total) by mouth at bedtime.   . montelukast (SINGULAIR) 10 MG tablet TAKE 1 TABLET AT BEDTIME   . Multiple Vitamins-Minerals (MULTIVITAMIN WITH MINERALS) tablet Take 1 tablet by mouth daily. Reported on 07/28/2015   . naproxen (NAPROSYN) 500 MG tablet Take 500 mg by mouth daily.   . nicotine (EQL NICOTINE) 21 mg/24hr patch Place 21 mg onto the skin daily. 04/21/2016: This morning's patch fell off sometime today - no current patch   . omeprazole (PRILOSEC) 40 MG capsule Take 1 capsule (40 mg total) by mouth daily. (Patient taking differently: Take 40 mg by mouth 2 (two) times daily. )   . OXYGEN Inhale 6 L into the lungs daily.   . potassium chloride SA (K-DUR,KLOR-CON) 20 MEQ tablet  Take 2 tablets (40 mEq total) by mouth 2 (two) times daily.   [name] tiotropium (SPIRIVA HANDIHALER) 18 MCG inhalation capsule Place 1 capsule (18 mcg total) into inhaler and inhale daily.   . ziprasidone (GEODON) 80 MG capsule Take 1 capsule (80 mg total) by mouth at bedtime.   [name] PRESCRIPTION MEDICATION Inhale into the lungs at bedtime. CPAP   . [DISCONTINUED] traMADol (ULTRAM) 50 MG tablet Take 1 tablet (50 mg total) by mouth every 12 (twelve) hours as needed for moderate pain or severe pain. (Patient not taking: Reported on  04/28/2016)    No facility-administered encounter medications on file as of 04/28/2016.     Functional Status: In your present state of health, do you have any difficulty performing the following activities: 04/25/2016 04/22/2016  Hearing? N N  Vision? N N  Difficulty concentrating or making decisions? N N  Walking or climbing stairs? Y Y  Dressing or bathing? Y Y  Doing errands, shopping? N N  Preparing Food and eating ? N -  Using the Toilet? N -  In the past six months, have you accidently leaked urine? N -  Do you have problems with loss of bowel control? N -  Managing your Medications? Y -  Managing your Finances? Y -  Housekeeping or managing your Housekeeping? Y -  Some encounter information is confidential and restricted. Go to Review Flowsheets activity to see all data.  Some recent data might be hidden    Fall/Depression Screening: PHQ 2/9 Scores 04/25/2016 04/19/2016 04/17/2016 04/07/2016 04/05/2016 02/18/2016 12/31/2015  PHQ - 2 Score '1 1 1 ' 0 1 1 0  PHQ- 9 Score - - - - - - -  Some encounter information is confidential and restricted. Go to Review Flowsheets activity to see all data.    Assessment: Patient's medications were reconciled by comparing the most recent discharge summary, the medication list from her provider visit 04/27/16, and via patient interview with actual medication bottles and the patient's pill box.  Drugs sorted by system:  Neurologic/Psychologic: Escitalopram Divalproex Benztropine  Mirtazapine Ziprasidone Gabapentin  Cardiovascular: Atorvastatin Aspirin Furosemide  Pulmonary/Allergy: Montelukast Breo Albuterol Nebulizer Solution Ventolin HFA Spiriva  Pain: Naproxen Acetaminophen  Endocrine: Metformin  Vitamins/Minerals: Potassium Chloride Multivitamin   Gastrointestinal: Omeprazole- patient is taking 1 capsule twice daily vs once daily as is on the medication list in the patient chart  Misc: Nicotine patches  40m  Medication review findings:  Potassium-patient needs a new prescription for 20 MEq tablets. Patient only had a few 20 MEq tablets so the Potassium 10 mEq tablets were used  to fill her pill box.    Nexium-patient is still taking 1 capsule twice daily despite the label saying 1 capsule daily  Amlodipine and metolazone were discontinued at discharge (Both medications were still in the patient's pill box. Both medications were removed from the pill box during the pharmacy visit. Patient said she knew they were the ones she is not supposed to take.)   Plan/Intervention:  Patient was assisted with filling her pill box correctly.   All discontinued medications were separated from active medications.  I will follow up with the patient in 1 week and inquire about pill packaging.  KElayne Guerin PharmD, [place] Pharmacist [phone]

## 2016-04-28 NOTE — Telephone Encounter (Signed)
Medication management - Message left for patient informing of Dr. Marguerite Olea concerns if she increased Cogentin back to 1mg ; dry mouth, increased risk for falls and constipation.  Requested pt. call us back if still wants to do this or to discuss further.

## 2016-05-01 ENCOUNTER — Ambulatory Visit: Payer: Medicare HMO | Admitting: Family Medicine

## 2016-05-01 ENCOUNTER — Other Ambulatory Visit: Payer: Self-pay | Admitting: *Deleted

## 2016-05-01 ENCOUNTER — Ambulatory Visit: Payer: Self-pay | Admitting: *Deleted

## 2016-05-01 NOTE — Patient Outreach (Signed)
[name]) Care Management  05/01/2016  Tiffany [name] 05/16/1960 [id number]  [name] made an attempt to try and contact patient today to follow-up regarding social work services and resources, as well as to ensure that patient received the packet of resource information that [name] mailed to patient's home.  However, patient was unavailable, so [name] left a HIPAA compliant message for patient on voicemail.  [name] is currently awaiting a return call.  [name] will try and schedule an initial home visit with patient to assist with completion of Advanced Directives ([place] documents).  [name] hopes to be able to schedule this visit with patient for Wednesday, April 18th. [name], BSW, MSW, LCSW  Licensed Education officer, environmental Health System  Mailing [place] [address] [place], [place] [address] Physical Address-[address], [place], [place] [address] Toll Free Main # [phone] Fax # [phone] Cell # [phone]  Office # [phone] [email or web address]

## 2016-05-02 ENCOUNTER — Ambulatory Visit: Payer: Self-pay | Admitting: *Deleted

## 2016-05-03 ENCOUNTER — Other Ambulatory Visit: Payer: Self-pay | Admitting: *Deleted

## 2016-05-03 ENCOUNTER — Ambulatory Visit (HOSPITAL_COMMUNITY): Payer: Self-pay | Admitting: Clinical

## 2016-05-03 ENCOUNTER — Telehealth: Payer: Self-pay | Admitting: Family Medicine

## 2016-05-03 NOTE — Patient Outreach (Signed)
[facility]) Care Management  05/03/2016  [name] [birth year] [id number]   [name] was able to make contact with patient today to follow-up regarding completion of her Advanced Directives ([place] documents).  [name] had wanted to meet with patient today, and had actually scheduled an initial home visit, to assist patient with completion of the documents; however, patient denied being available today, or at any time during the remainder of the week.  Patient reported that her next availability would be on Friday, April 27th at 2:30pm.  Therefore, [name] has scheduled an initial home visit with patient for next Friday to assist with completion of patient's Advanced Directives. No additional social work needs have been identified at this time. [name], BSW, MSW, LCSW  Licensed Education officer, environmental Health System  Mailing [place] [address] [place], [place] [address] Physical Address-[address], [place], [place] [address] Toll Free Main # [phone] Fax # [phone] Cell # [phone]  Office # [phone] [email or web address]

## 2016-05-03 NOTE — Telephone Encounter (Signed)
Pt reports she has appt 05/11/16. She also mentions experiencing  swelling and bloating. I told her I can't give proper medical advice. I recommend she make a sooner appt here if her sxs get worse or call 911 if she's in pain and sxs are unbearable. I offered to schedule a sooner appt, but pt declined.  - Tiffany [name]

## 2016-05-04 ENCOUNTER — Encounter: Payer: Self-pay | Admitting: *Deleted

## 2016-05-04 ENCOUNTER — Other Ambulatory Visit: Payer: Self-pay | Admitting: *Deleted

## 2016-05-04 NOTE — Patient Outreach (Signed)
[facility]) Care Management   05/04/2016  CRYSTA GULICK May 27, 1960 [id number]  JANIEL DERHAMMER is an 56 y.o. female  Subjective:   Member report that she is "doing real good."  She state that she was not feeling well earlier in the week, but feel a lot better today.  She denies any shortness of breath today.  She report compliance with medications, denies use of cocaine since November, no smoking since last month.  Objective:   Review of Systems  Constitutional: Negative.   HENT: Negative.   Eyes: Negative.   Respiratory: Positive for shortness of breath.        Intermittent  Cardiovascular: Negative.   Gastrointestinal: Negative.   Genitourinary: Negative.   Musculoskeletal: Negative.   Skin: Negative.   Neurological: Negative.   Endo/Heme/Allergies: Negative.   Psychiatric/Behavioral: Negative.     Physical Exam  Constitutional: [name] oriented to person, place, and time. She appears well-developed and well-nourished.  Neck: Normal range of motion.  Cardiovascular: Normal rate, regular rhythm and normal heart sounds.   Respiratory: Effort normal and breath sounds normal.  GI: Soft. Bowel sounds are normal.  Musculoskeletal: Normal range of motion.  Neurological: [name] alert and oriented to person, place, and time.  Skin: Skin is warm and dry.   BP (!) 146/98 (BP Location: Left Arm, Patient Position: Sitting, Cuff Size: Normal)   Pulse 87   Resp (!) 22   Ht 1.626 m ('5\' 4"' )   Wt 294 lb (133.4 kg)   SpO2 97%   BMI 50.46 kg/m   Encounter Medications:   Outpatient Encounter Prescriptions as of 05/04/2016  Medication Sig Note  . acetaminophen (TYLENOL 8 HOUR) 650 MG CR tablet Take 1 tablet (650 mg total) by mouth every 8 (eight) hours as needed for pain.   [name] albuterol (PROVENTIL HFA;VENTOLIN HFA) 108 (90 Base) MCG/ACT inhaler Inhale 2 puffs into the lungs every 6 (six) hours as needed.   [name] aspirin 81 MG EC tablet Take 1 tablet (81 mg total) by  mouth daily. Swallow whole.   [name] atorvastatin (LIPITOR) 40 MG tablet Take 1 tablet (40 mg total) by mouth daily.   . benztropine (COGENTIN) 0.5 MG tablet Take 1 tablet (0.5 mg total) by mouth daily.   . Blood Glucose Monitoring Suppl (ACCU-CHEK AVIVA PLUS) w/Device KIT CHECK BLOOD SUGAR THREE TIMES DAILY   . BREO ELLIPTA 100-25 MCG/INH AEPB INHALE 1 PUFF EVERY DAY   . divalproex ([name] ER) 500 MG 24 hr tablet Take 1 tablet (500 mg total) by mouth daily. (Patient taking differently: Take 500 mg by mouth at bedtime. )   . escitalopram (LEXAPRO) 20 MG tablet Take 1 tablet (20 mg total) by mouth daily.   . furosemide (LASIX) 40 MG tablet Take 2 tablets (80 mg total) by mouth 2 (two) times daily.   [name] gabapentin (NEURONTIN) 600 MG tablet Take 1 tablet (600 mg total) by mouth 3 (three) times daily.   [name] glucose blood test strip Use to check blood sugar 3 times daily.   . metFORMIN (GLUCOPHAGE-XR) 500 MG 24 hr tablet TAKE 1 TABLET TWICE DAILY   . mirtazapine (REMERON) 30 MG tablet Take 1 tablet (30 mg total) by mouth at bedtime.   . montelukast (SINGULAIR) 10 MG tablet TAKE 1 TABLET AT BEDTIME   . Multiple Vitamins-Minerals (MULTIVITAMIN WITH MINERALS) tablet Take 1 tablet by mouth daily. Reported on 07/28/2015   . naproxen (NAPROSYN) 500 MG tablet Take 500 mg by  mouth daily.   . nicotine (EQL NICOTINE) 21 mg/24hr patch Place 21 mg onto the skin daily. 04/21/2016: This morning's patch fell off sometime today - no current patch   . omeprazole (PRILOSEC) 40 MG capsule Take 1 capsule (40 mg total) by mouth daily. (Patient taking differently: Take 40 mg by mouth 2 (two) times daily. )   . OXYGEN Inhale 6 L into the lungs daily.   [name] tiotropium (SPIRIVA HANDIHALER) 18 MCG inhalation capsule Place 1 capsule (18 mcg total) into inhaler and inhale daily.   . ziprasidone (GEODON) 80 MG capsule Take 1 capsule (80 mg total) by mouth at bedtime.   [name] albuterol (PROVENTIL) (2.5 MG/3ML) 0.083% nebulizer solution Take 3  mLs (2.5 mg total) by nebulization every 6 (six) hours as needed for wheezing or shortness of breath.   . Alcohol Swabs (B-D SINGLE USE SWABS REGULAR) PADS CHECK CAPILLARY BLOOD GLUCOSE ONE TIME DAILY   . potassium chloride SA (K-DUR,KLOR-CON) 20 MEQ tablet Take 2 tablets (40 mEq total) by mouth 2 (two) times daily.   [name] PRESCRIPTION MEDICATION Inhale into the lungs at bedtime. CPAP    No facility-administered encounter medications on file as of 05/04/2016.     Functional Status:   In your present state of health, do you have any difficulty performing the following activities: 04/25/2016 04/22/2016  Hearing? N N  Vision? N N  Difficulty concentrating or making decisions? N N  Walking or climbing stairs? Y Y  Dressing or bathing? Y Y  Doing errands, shopping? N N  Preparing Food and eating ? N -  Using the Toilet? N -  In the past six months, have you accidently leaked urine? N -  Do you have problems with loss of bowel control? N -  Managing your Medications? Y -  Managing your Finances? Y -  Housekeeping or managing your Housekeeping? Y -  Some encounter information is confidential and restricted. Go to Review Flowsheets activity to see all data.  Some recent data might be hidden    Fall/Depression Screening:    PHQ 2/9 Scores 04/25/2016 04/19/2016 04/17/2016 04/07/2016 04/05/2016 02/18/2016 12/31/2015  PHQ - 2 Score '1 1 1 ' 0 1 1 0  PHQ- 9 Score - - - - - - -  Some encounter information is confidential and restricted. Go to Review Flowsheets activity to see all data.   Fall Risk  04/25/2016 04/17/2016 04/07/2016 07/16/2015 05/14/2015  Falls in the past year? No No No Yes Yes  Number falls in past yr: - - - 1 1  Injury with Fall? - - - No Yes  Risk Factor Category  - - - High Fall Risk High Fall Risk  Risk for fall due to : History of fall(s);Impaired mobility Impaired mobility - - History of fall(s);Impaired balance/gait;Impaired mobility  Follow up - - - Falls prevention discussed Falls  prevention discussed    Assessment:    Met with member at scheduled time.  [name] currently filling pill box, all medications on list per chart are available in the home, member compliant.  She state she has not been compliant with daily weights since discharge.  Discussed importance in recording weights daily in effort to effectively manage heart failure, she report she will begin today (294 pounds).  She state she was 284 pounds last week, per notes, 291 pounds in office.  She state that she has a lot to eat over the weekend and did not feel well on Monday, missed scheduled appointment.  She state that she thought she ate too much over the weekend (report eating foods high in sodium) and was not feeling well due to overeating instead of potential fluid overload.  Advised in the future to make attempts to attend office, especially if she is not feeling well.  She verbalizes understanding.  Re-educated again on low sodium diet, report that her primary MD discussed possible involvement with dietician.  She state she will discuss next week at follow up.  She denies any structured exercise routine, discussed [employer] program through [facility].  She state she is willing to consider, agrees to referral.  She report that she has been checking her blood pressure on a regular basis, state it has been elevated, ranging 390Z-009Q systolic. She has been taken off Amlodipine, but report that she does take one tablet when her blood pressure is elevated. She denies contacting MD regarding blood pressure.  She denies any further concerns, advised to contact with questions.  Plan:   Will contact primary MD office regarding member's concern about blood pressure and weight increase. Will follow up next week with weekly transition of care call. Will place referral to [facility] program through [facility].  THN CM Care Plan Problem One     Most Recent Value  Care Plan Problem One  Risk for hospital readmission related to recent admission  for heart failure vs. possible end-stage COPD  Role Documenting the Problem One  Care Management Coordinator  Care Plan for Problem One  Active  THN Long Term Goal (31-90 days)  Patient will remain free from hospital readmision within the next 31 days  THN Long Term Goal Start Date  04/17/16  Interventions for Problem One Long Term Goal  RNCM educated patient on importance of following dc instructions, weighing daily and reporting weight gain, taking medications as prescribed, attending all MD follow up appointments  THN CM Short Term Goal #1 (0-30 days)  Patient will attend all follow up appointments as sceduled within the next 30 days  THN CM Short Term Goal #1 Start Date  04/17/16  [facility] CM Short Term Goal #1 Met Date  05/04/16  Interventions for Short Term Goal #1  RNCM ensured patient has scheduled follow up with ,  educated on importance of following up with cardiologist and pulmonologist as well for chronic conditions  THN CM Short Term Goal #2 (0-30 days)  Patient will take all medications as prescribed for the next 30 days  THN CM Short Term Goal #2 Start Date  04/17/16  Interventions for Short Term Goal #2  RNCM ensured patient has all prescribed medications and educated on importance of taking them as prescribed,  pharmacy referral for patient related to concerns/reported confusion about her medications     [name], Therapist, sports, MSN [place] Manager [phone]

## 2016-05-05 ENCOUNTER — Telehealth: Payer: Self-pay | Admitting: Family Medicine

## 2016-05-05 ENCOUNTER — Other Ambulatory Visit: Payer: Self-pay | Admitting: *Deleted

## 2016-05-05 MED ORDER — AMLODIPINE BESYLATE 5 MG PO TABS: 5.0000 mg | ORAL_TABLET | Freq: Every day | ORAL | 0 refills | Status: DC

## 2016-05-05 NOTE — Patient Outreach (Signed)
[facility]) Care Management  05/05/2016  Tiffany [name] 10/18/1960 [id number]   Call placed to primary MD office in reference to member's concern for elevated blood pressure and taking Amlodipine as it was discontinued at her last discharge.  Message left with Raquel Sarna, will await call back for further instructions regarding use of medication.   Update @ 1230:  Call received back from Dr. Lorenso Courier, approval to restart Amlodipine 5mg  daily received as member's blood pressure has increased since discharge.  Member called and notified, reminded to continue to check pressure daily.  Also reminded of follow up appointment next week.  She verbalizes understanding.   [name], [place], MSN [place] [phone]

## 2016-05-05 NOTE — Telephone Encounter (Signed)
Patient was not discharged with any anti-hypertensives.  [place] for her to continue amlodipine 5mg . Dicussed with [facility]. She has f/u with me next week.  Archie Patten, MD [facility] Family Medicine Resident  05/05/2016, 12:19 PM

## 2016-05-05 NOTE — Telephone Encounter (Signed)
Will forward to PCP.  Kimberlynn Lumbra L, RN  

## 2016-05-05 NOTE — Telephone Encounter (Signed)
Pt was taken off amlodipine in the hospital, but BP has been coming back up so pt has started to take amlodipine again, Tiffany [name] wants to know if this is ok. BP yesterday was 146/98 ep

## 2016-05-08 ENCOUNTER — Telehealth: Payer: Self-pay | Admitting: Family Medicine

## 2016-05-08 ENCOUNTER — Other Ambulatory Visit: Payer: Self-pay | Admitting: Family Medicine

## 2016-05-08 ENCOUNTER — Ambulatory Visit (INDEPENDENT_AMBULATORY_CARE_PROVIDER_SITE_OTHER): Payer: Medicare HMO | Admitting: Emergency Medicine

## 2016-05-08 ENCOUNTER — Encounter: Payer: Self-pay | Admitting: Emergency Medicine

## 2016-05-08 DIAGNOSIS — J449 Chronic obstructive pulmonary disease, unspecified: Secondary | ICD-10-CM

## 2016-05-08 DIAGNOSIS — F17200 Nicotine dependence, unspecified, uncomplicated: Secondary | ICD-10-CM | POA: Diagnosis not present

## 2016-05-08 DIAGNOSIS — G4733 Obstructive sleep apnea (adult) (pediatric): Secondary | ICD-10-CM | POA: Diagnosis not present

## 2016-05-08 LAB — PULMONARY FUNCTION TEST
FEF 25-75 Pre: 0.65 L/sec
FEF2575-%Pred-Pre: 30 %
FEV1-%Pred-Pre: 53 %
FEV1-Pre: 1.13 L
FEV1FVC-%Pred-Pre: 82 %
FEV6-%Pred-Pre: 65 %
FEV6-Pre: 1.7 L
FEV6FVC-%Pred-Pre: 103 %
FVC-%Pred-Pre: 63 %
FVC-Pre: 1.7 L
Pre FEV1/FVC ratio: 66 %
Pre FEV6/FVC Ratio: 100 %

## 2016-05-08 NOTE — Patient Instructions (Addendum)
Please continue your Breo and Spiriva as you are taking them  Please keep albuterol available to use 2 puffs up to every 4 hours if needed for shortness of breath.  Continue your oxygen at 6L/min at all times. We need to perform a repeat split night sleep study to get you back on CPAP at night.  CONGRATULATIONS ON STOPPING SMOKING!! Continue to work on weight loss. Follow up with the nutritionist as already planned.  Follow with Dr Lamonte Sakai in 2  months or sooner if you have any problems.

## 2016-05-08 NOTE — Progress Notes (Signed)
Spirometry done today. 

## 2016-05-08 NOTE — Telephone Encounter (Signed)
Pt wants to know about the referral to the nutritionist.  Please advise

## 2016-05-08 NOTE — Telephone Encounter (Signed)
AFTER HOURS LINE  The patient notes she has swelling. She's worried because at her pulmonologist, she was 301. She's unsure how she gained so much weight.  When she presses on her legs, she notes she has "dents".  Not weighing herself at home, "she forgot."   She's taking lasix 80mg  BID. She took Lasix at 4pm today and didn't go to the bathroom that much as usual.   Her breathing is okay, she was satting 90% on her home 6L [place] at the doctor's office today.  Discussed taking 1 more lasix 40mg  now to see if an increased dose would help her diurese. If she notes improvement, go up to lasix 80mg  TID tomorrow. If she notes SOB, pt advised to be evaluated in the ED.  Archie Patten, MD [facility] Family Medicine Resident  05/08/2016, 7:32 PM

## 2016-05-08 NOTE — Addendum Note (Signed)
Addended by: Jannette Spanner on: 05/08/2016 11:04 AM   Modules accepted: Orders

## 2016-05-08 NOTE — Assessment & Plan Note (Signed)
The untreated but she is interested in restarting therapy. We will perform a split night sleep study, restart CPAP versus BiPAP after. Suspect she may be a BiPAP candidate given her OHS

## 2016-05-08 NOTE — Assessment & Plan Note (Signed)
Severe obstruction. She did stop smoking which I congratulated. We will continue her current bronchodilator regimen.  Please continue your Breo and Spiriva as you are taking them  Please keep albuterol available to use 2 puffs up to every 4 hours if needed for shortness of breath.  Continue your oxygen at 6L/min at all times. CONGRATULATIONS ON STOPPING SMOKING!! Follow with Dr Lamonte Sakai in 2  months or sooner if you have any problems.

## 2016-05-08 NOTE — Telephone Encounter (Signed)
Called patient again to check in and see how she was doing. She notes a odd feeling on the R side of her chest. Difficult to describe. Maybe some mild SOB. She's started to urinate but notes she's also started to drink water to try to help her urinate.  Give vague R sided chest pain and patient's risk factors, discussed that she may benefit from evaluation in the ED. Patient voiced understanding.  Archie Patten, MD [facility] Family Medicine Resident  05/08/2016, 9:36 PM

## 2016-05-08 NOTE — Progress Notes (Signed)
Subjective:    Patient ID: Tiffany [name], female    DOB: 02-05-1960, 56 y.o.   MRN: [id number]  HPI 56 year old active smoker (18 pack years), with a history of schizophrenia, childhood asthma, substance abuse including cocaine, tobacco use and suspected COPD apparently with asthmatic features based on past clinical history. She has been able to stay off cocaine since her last hospitalization.  She was dx w COPD in her 31's. Currently managed on Spiriva and Breo. She uses albuterol about 3-4x a day. She believes that it helps w dyspnea and wheeze. She has cough, much less than in recent months. She has an acute flare about once a year, has been rx with abx and pred before. She has OSA but does not use CPAP, trouble tolerating the mask.   ROV 05/08/16 -- follow up visit for hx tobacco and asthma, OSA. Chronic hypoxemic resp failure from this + OHS. She stopped smoking 04/10/16!! She believes that her dyspnea is about the same, cough and mucous are better off the cigarettes.  [name] using 71m nicotine patches. She remains on breo and Spiriva. No exacerbations since last time. Uses albuterol much less, not every week. She underwent spirometry today that I have personally reviewed. This shows severe obstruction, FEV1 1.13 L (53% predicted).    Review of Systems  Constitutional: Positive for unexpected weight change. Negative for fever.  HENT: Positive for dental problem and sinus pressure. Negative for congestion, ear pain, nosebleeds, postnasal drip, rhinorrhea, sneezing, sore throat and trouble swallowing.   Eyes: Negative.  Negative for redness and itching.  Respiratory: Positive for cough, shortness of breath and wheezing. Negative for chest tightness.   Cardiovascular: Positive for leg swelling. Negative for palpitations.  Gastrointestinal: Negative.  Negative for nausea and vomiting.  Endocrine: Negative.   Genitourinary: Negative.  Negative for dysuria.  Musculoskeletal: Positive for joint  swelling.  Skin: Negative.  Negative for rash.  Allergic/Immunologic: Negative.   Neurological: Negative.  Negative for headaches.  Hematological: Bruises/bleeds easily.  Psychiatric/Behavioral: Negative.  Negative for dysphoric mood. The patient is not nervous/anxious.    Past Medical History:  Diagnosis Date  . Anxiety   . Arthritis    "all over" (04/10/2016)  . Asthma   . CHF (congestive heart failure) ([place]   . Chronic bronchitis ([place]   . Chronic kidney disease    "I see a kidney dr." (04/10/2016)  . Cocaine abuse   . Complication of anesthesia    decreased bp, decreased heart rate  . Depression   . Disorder of nervous system   . Emphysema   . GERD (gastroesophageal reflux disease)   . Heart attack ([place] 1980[place]  . History of blood transfusion 1994   "couldn't stop bleeding from my period"  . Hyperlipidemia LDL goal <70   . Hypertension   . Incontinence   . Manic depression ([place]   . On home oxygen therapy    "6L; 24/7" (04/10/2016)  . OSA on CPAP    "wear mask sometimes" (04/10/2016)  . Paranoid ([place]    "sometimes; I'm on RX for it" (04/10/2016)  . Pneumonia    "I've had it several times; haven't had it since 06/2015" (04/10/2016)  . Schizophrenia ([place]   . Seasonal allergies   . Seizures ([place]    "don't know what kind; last one was ~ 1 yr ago" (04/10/2016)  . Sinus trouble   . Stroke ([facility] 1980s   denies residual on 04/10/2016  . Type II diabetes mellitus (  [place])      Family History  Problem Relation Age of Onset  . Cancer Father     prostate  . Cancer Mother     lung  . Depression Mother   . Depression Sister   . Anxiety disorder Sister   . Schizophrenia Sister   . Bipolar disorder Sister   . Depression Sister   . Depression Brother   . Heart failure      cousin     Social History   Social History  . Marital status: Widowed    Spouse name: N/A  . Number of children: 3  . Years of education: N/A   Occupational History  . disabled     factory  production   Social History Main Topics  . Smoking status: Former Smoker    Packs/day: 1.50    Years: 38.00    Types: Cigarettes    Start date: 03/13/1977  . Smokeless tobacco: Never Used  . Alcohol use No  . Drug use: Yes    Types: Cocaine     Comment: 04/10/2016 "last used cocaine back in November 2017"  . Sexual activity: No   Other Topics Concern  . Not on file   Social History Narrative   Has 1 son, [name]   Lives with son and his boyfriend   Her house has ramps and handrails should she ever needs them.    Her mother lives down the street from her and is a good support person in addition to her son.   She drives herself, has private transportation.    Cocaine free since 02/24/16, smoke free since 04/10/16        Allergies  Allergen Reactions  . Hydroxyzine Shortness Of Breath    -throat closed up  . Codeine Nausea And Vomiting  . Sulfa Antibiotics Itching     Outpatient Medications Prior to Visit  Medication Sig Dispense Refill  . acetaminophen (TYLENOL 8 HOUR) 650 MG CR tablet Take 1 tablet (650 mg total) by mouth every 8 (eight) hours as needed for pain. 60 tablet 0  . albuterol (PROVENTIL HFA;VENTOLIN HFA) 108 (90 Base) MCG/ACT inhaler Inhale 2 puffs into the lungs every 6 (six) hours as needed. 1 Inhaler 6  . albuterol (PROVENTIL) (2.5 MG/3ML) 0.083% nebulizer solution Take 3 mLs (2.5 mg total) by nebulization every 6 (six) hours as needed for wheezing or shortness of breath. 150 mL 1  . Alcohol Swabs (B-D SINGLE USE SWABS REGULAR) PADS CHECK CAPILLARY BLOOD GLUCOSE ONE TIME DAILY 100 each 3  . amLODipine (NORVASC) 5 MG tablet Take 1 tablet (5 mg total) by mouth daily. 90 tablet 0  . aspirin 81 MG EC tablet Take 1 tablet (81 mg total) by mouth daily. Swallow whole. 30 tablet 1  . atorvastatin (LIPITOR) 40 MG tablet Take 1 tablet (40 mg total) by mouth daily. 30 tablet 1  . benztropine (COGENTIN) 0.5 MG tablet Take 1 tablet (0.5 mg total) by mouth daily. 30 tablet 2  .  Blood Glucose Monitoring Suppl (ACCU-CHEK AVIVA PLUS) w/Device KIT CHECK BLOOD SUGAR THREE TIMES DAILY 1 kit 0  . BREO ELLIPTA 100-25 MCG/INH AEPB INHALE 1 PUFF EVERY DAY 180 each 0  . divalproex ([name] ER) 500 MG 24 hr tablet Take 1 tablet (500 mg total) by mouth daily. (Patient taking differently: Take 500 mg by mouth at bedtime. ) 30 tablet 2  . escitalopram (LEXAPRO) 20 MG tablet Take 1 tablet (20 mg total) by mouth daily. [place]  tablet 2  . furosemide (LASIX) 40 MG tablet Take 2 tablets (80 mg total) by mouth 2 (two) times daily. 120 tablet 0  . gabapentin (NEURONTIN) 600 MG tablet Take 1 tablet (600 mg total) by mouth 3 (three) times daily. 90 tablet 1  . glucose blood test strip Use to check blood sugar 3 times daily. 100 each 12  . metFORMIN (GLUCOPHAGE-XR) 500 MG 24 hr tablet TAKE 1 TABLET TWICE DAILY 180 tablet 0  . mirtazapine (REMERON) 30 MG tablet Take 1 tablet (30 mg total) by mouth at bedtime. 30 tablet 2  . montelukast (SINGULAIR) 10 MG tablet TAKE 1 TABLET AT BEDTIME 90 tablet 3  . Multiple Vitamins-Minerals (MULTIVITAMIN WITH MINERALS) tablet Take 1 tablet by mouth daily. Reported on 07/28/2015 30 tablet 1  . naproxen (NAPROSYN) 500 MG tablet Take 500 mg by mouth daily.    . nicotine (EQL NICOTINE) 21 mg/24hr patch Place 21 mg onto the skin daily.    [name] omeprazole (PRILOSEC) 40 MG capsule Take 1 capsule (40 mg total) by mouth daily. (Patient taking differently: Take 40 mg by mouth 2 (two) times daily. ) 30 capsule 1  . OXYGEN Inhale 6 L into the lungs daily.    [name] PRESCRIPTION MEDICATION Inhale into the lungs at bedtime. CPAP    . tiotropium (SPIRIVA HANDIHALER) 18 MCG inhalation capsule Place 1 capsule (18 mcg total) into inhaler and inhale daily. 90 capsule 1  . ziprasidone (GEODON) 80 MG capsule Take 1 capsule (80 mg total) by mouth at bedtime. 30 capsule 2  . potassium chloride SA (K-DUR,KLOR-CON) 20 MEQ tablet Take 2 tablets (40 mEq total) by mouth 2 (two) times daily. 28 tablet  0   No facility-administered medications prior to visit.         Objective:   Physical Exam Vitals:   05/08/16 1009  BP: 120/78  Pulse: 85  SpO2: 90%  Weight: (!) 301 lb (136.5 kg)  Height: 5' 3.5" (1.613 m)   Gen: Pleasant, overwt, in no distress,  normal affect  ENT: No lesions,  mouth clear,  oropharynx clear, no postnasal drip  Neck: No JVD, no TMG, no carotid bruits  Lungs: No use of accessory muscles, clear without rales or rhonchi  Cardiovascular: RRR, heart sounds normal, no murmur or gallops, no peripheral edema  Musculoskeletal: No deformities, no cyanosis or clubbing  Neuro: alert, non focal  Skin: Warm, no lesions or rashes       Assessment & Plan:  COPD (chronic obstructive pulmonary disease) (HCC) Severe obstruction. She did stop smoking which I congratulated. We will continue her current bronchodilator regimen.  Please continue your Breo and Spiriva as you are taking them  Please keep albuterol available to use 2 puffs up to every 4 hours if needed for shortness of breath.  Continue your oxygen at 6L/min at all times. CONGRATULATIONS ON STOPPING SMOKING!! Follow with Dr Lamonte Sakai in 2  months or sooner if you have any problems.  Obstructive sleep apnea The untreated but she is interested in restarting therapy. We will perform a split night sleep study, restart CPAP versus BiPAP after. Suspect she may be a BiPAP candidate given her OHS  Tobacco use disorder She stopped possibly one month ago. Congratulated this, supported this. She wants to continue nicotine patches 21 mg for now. We discussed weaning down to 14 mg in the near future. Goal will be to wean down completely.  Baltazar Apo, MD, PhD 05/08/2016, 10:58 AM [facility] [phone] or if no answer [phone]

## 2016-05-08 NOTE — Assessment & Plan Note (Signed)
She stopped possibly one month ago. Congratulated this, supported this. She wants to continue nicotine patches 21 mg for now. We discussed weaning down to 14 mg in the near future. Goal will be to wean down completely.

## 2016-05-08 NOTE — Telephone Encounter (Signed)
Referral in Epic, will forward to Dr. Jenne Campus.

## 2016-05-09 ENCOUNTER — Inpatient Hospital Stay (HOSPITAL_COMMUNITY)
Admission: EM | Admit: 2016-05-09 | Discharge: 2016-05-12 | DRG: 445 | Disposition: A | Discharge status: Home Health | Payer: Medicare HMO | Attending: Family Medicine | Admitting: Family Medicine

## 2016-05-09 ENCOUNTER — Ambulatory Visit: Payer: Self-pay | Admitting: Gastroenterology

## 2016-05-09 ENCOUNTER — Inpatient Hospital Stay (HOSPITAL_COMMUNITY): Payer: Medicare HMO

## 2016-05-09 ENCOUNTER — Emergency Department (HOSPITAL_COMMUNITY): Payer: Medicare HMO

## 2016-05-09 ENCOUNTER — Other Ambulatory Visit: Payer: Self-pay | Admitting: *Deleted

## 2016-05-09 ENCOUNTER — Encounter (HOSPITAL_COMMUNITY): Payer: Self-pay | Admitting: Emergency Medicine

## 2016-05-09 DIAGNOSIS — E662 Morbid (severe) obesity with alveolar hypoventilation: Secondary | ICD-10-CM | POA: Diagnosis present

## 2016-05-09 DIAGNOSIS — Z4803 Encounter for change or removal of drains: Secondary | ICD-10-CM | POA: Diagnosis not present

## 2016-05-09 DIAGNOSIS — K8020 Calculus of gallbladder without cholecystitis without obstruction: Secondary | ICD-10-CM | POA: Diagnosis not present

## 2016-05-09 DIAGNOSIS — F209 Schizophrenia, unspecified: Secondary | ICD-10-CM | POA: Diagnosis present

## 2016-05-09 DIAGNOSIS — Z87891 Personal history of nicotine dependence: Secondary | ICD-10-CM

## 2016-05-09 DIAGNOSIS — R1010 Upper abdominal pain, unspecified: Secondary | ICD-10-CM

## 2016-05-09 DIAGNOSIS — Z79899 Other long term (current) drug therapy: Secondary | ICD-10-CM

## 2016-05-09 DIAGNOSIS — K9423 Gastrostomy malfunction: Secondary | ICD-10-CM | POA: Diagnosis not present

## 2016-05-09 DIAGNOSIS — Z7982 Long term (current) use of aspirin: Secondary | ICD-10-CM

## 2016-05-09 DIAGNOSIS — G4733 Obstructive sleep apnea (adult) (pediatric): Secondary | ICD-10-CM | POA: Diagnosis present

## 2016-05-09 DIAGNOSIS — K819 Cholecystitis, unspecified: Secondary | ICD-10-CM

## 2016-05-09 DIAGNOSIS — Z7984 Long term (current) use of oral hypoglycemic drugs: Secondary | ICD-10-CM

## 2016-05-09 DIAGNOSIS — D72829 Elevated white blood cell count, unspecified: Secondary | ICD-10-CM

## 2016-05-09 DIAGNOSIS — G894 Chronic pain syndrome: Secondary | ICD-10-CM | POA: Diagnosis present

## 2016-05-09 DIAGNOSIS — Z9981 Dependence on supplemental oxygen: Secondary | ICD-10-CM | POA: Diagnosis not present

## 2016-05-09 DIAGNOSIS — D631 Anemia in chronic kidney disease: Secondary | ICD-10-CM | POA: Diagnosis present

## 2016-05-09 DIAGNOSIS — Z7951 Long term (current) use of inhaled steroids: Secondary | ICD-10-CM | POA: Diagnosis not present

## 2016-05-09 DIAGNOSIS — R1011 Right upper quadrant pain: Secondary | ICD-10-CM | POA: Diagnosis present

## 2016-05-09 DIAGNOSIS — K8000 Calculus of gallbladder with acute cholecystitis without obstruction: Principal | ICD-10-CM | POA: Diagnosis present

## 2016-05-09 DIAGNOSIS — R0602 Shortness of breath: Secondary | ICD-10-CM | POA: Diagnosis not present

## 2016-05-09 DIAGNOSIS — T373X5A Adverse effect of other antiprotozoal drugs, initial encounter: Secondary | ICD-10-CM | POA: Diagnosis not present

## 2016-05-09 DIAGNOSIS — I252 Old myocardial infarction: Secondary | ICD-10-CM

## 2016-05-09 DIAGNOSIS — E876 Hypokalemia: Secondary | ICD-10-CM | POA: Diagnosis not present

## 2016-05-09 DIAGNOSIS — E785 Hyperlipidemia, unspecified: Secondary | ICD-10-CM | POA: Diagnosis present

## 2016-05-09 DIAGNOSIS — K219 Gastro-esophageal reflux disease without esophagitis: Secondary | ICD-10-CM | POA: Diagnosis present

## 2016-05-09 DIAGNOSIS — I5032 Chronic diastolic (congestive) heart failure: Secondary | ICD-10-CM | POA: Diagnosis present

## 2016-05-09 DIAGNOSIS — L299 Pruritus, unspecified: Secondary | ICD-10-CM | POA: Diagnosis not present

## 2016-05-09 DIAGNOSIS — Z6841 Body Mass Index (BMI) 40.0 and over, adult: Secondary | ICD-10-CM | POA: Diagnosis not present

## 2016-05-09 DIAGNOSIS — K810 Acute cholecystitis: Secondary | ICD-10-CM

## 2016-05-09 DIAGNOSIS — E1140 Type 2 diabetes mellitus with diabetic neuropathy, unspecified: Secondary | ICD-10-CM | POA: Diagnosis present

## 2016-05-09 DIAGNOSIS — E1122 Type 2 diabetes mellitus with diabetic chronic kidney disease: Secondary | ICD-10-CM | POA: Diagnosis present

## 2016-05-09 DIAGNOSIS — F3164 Bipolar disorder, current episode mixed, severe, with psychotic features: Secondary | ICD-10-CM | POA: Diagnosis present

## 2016-05-09 DIAGNOSIS — I130 Hypertensive heart and chronic kidney disease with heart failure and stage 1 through stage 4 chronic kidney disease, or unspecified chronic kidney disease: Secondary | ICD-10-CM | POA: Diagnosis present

## 2016-05-09 DIAGNOSIS — N183 Chronic kidney disease, stage 3 (moderate): Secondary | ICD-10-CM | POA: Diagnosis present

## 2016-05-09 DIAGNOSIS — R079 Chest pain, unspecified: Secondary | ICD-10-CM | POA: Diagnosis not present

## 2016-05-09 DIAGNOSIS — Z8673 Personal history of transient ischemic attack (TIA), and cerebral infarction without residual deficits: Secondary | ICD-10-CM

## 2016-05-09 DIAGNOSIS — J439 Emphysema, unspecified: Secondary | ICD-10-CM | POA: Diagnosis present

## 2016-05-09 DIAGNOSIS — J449 Chronic obstructive pulmonary disease, unspecified: Secondary | ICD-10-CM | POA: Diagnosis not present

## 2016-05-09 DIAGNOSIS — Z791 Long term (current) use of non-steroidal anti-inflammatories (NSAID): Secondary | ICD-10-CM

## 2016-05-09 DIAGNOSIS — E119 Type 2 diabetes mellitus without complications: Secondary | ICD-10-CM | POA: Diagnosis not present

## 2016-05-09 LAB — BASIC METABOLIC PANEL
Anion gap: 11 (ref 5–15)
BUN: 18 mg/dL (ref 6–20)
CO2: 31 mmol/L (ref 22–32)
Calcium: 9.4 mg/dL (ref 8.9–10.3)
Chloride: 99 mmol/L — ABNORMAL LOW (ref 101–111)
Creatinine, Ser: 1.08 mg/dL — ABNORMAL HIGH (ref 0.44–1.00)
GFR calc Af Amer: >60 mL/min (ref >60)
GFR calc non Af Amer: 56 mL/min — ABNORMAL LOW (ref >60)
Glucose, Bld: 105 mg/dL — ABNORMAL HIGH (ref 65–99)
Potassium: 3.7 mmol/L (ref 3.5–5.1)
Sodium: 141 mmol/L (ref 135–145)

## 2016-05-09 LAB — CBC
HCT: 34.6 % — ABNORMAL LOW (ref 36.0–46.0)
Hemoglobin: 10.2 g/dL — ABNORMAL LOW (ref 12.0–15.0)
MCH: 27.6 pg (ref 26.0–34.0)
MCHC: 29.5 g/dL — ABNORMAL LOW (ref 30.0–36.0)
MCV: 93.8 fL (ref 78.0–100.0)
Platelets: 257 10*3/uL (ref 150–400)
RBC: 3.69 MIL/uL — ABNORMAL LOW (ref 3.87–5.11)
RDW: 15.7 % — ABNORMAL HIGH (ref 11.5–15.5)
WBC: 11.9 10*3/uL — ABNORMAL HIGH (ref 4.0–10.5)

## 2016-05-09 LAB — GLUCOSE, CAPILLARY
Glucose-Capillary: 112 mg/dL — ABNORMAL HIGH (ref 65–99)
Glucose-Capillary: 95 mg/dL (ref 65–99)
Glucose-Capillary: 97 mg/dL (ref 65–99)

## 2016-05-09 LAB — HEPATIC FUNCTION PANEL
ALT: 18 U/L (ref 14–54)
AST: 22 U/L (ref 15–41)
Albumin: 3.4 g/dL — ABNORMAL LOW (ref 3.5–5.0)
Alkaline Phosphatase: 101 U/L (ref 38–126)
Bilirubin, Direct: <0.1 mg/dL — ABNORMAL LOW (ref 0.1–0.5)
Total Bilirubin: 0.3 mg/dL (ref 0.3–1.2)
Total Protein: 6.1 g/dL — ABNORMAL LOW (ref 6.5–8.1)

## 2016-05-09 LAB — PROTIME-INR
INR: 1
Prothrombin Time: 13.2 seconds (ref 11.4–15.2)

## 2016-05-09 LAB — BRAIN NATRIURETIC PEPTIDE: B Natriuretic Peptide: 15.4 pg/mL (ref 0.0–100.0)

## 2016-05-09 LAB — I-STAT TROPONIN, ED: Troponin i, poc: 0 ng/mL (ref 0.00–0.08)

## 2016-05-09 LAB — LIPASE, BLOOD: Lipase: 33 U/L (ref 11–51)

## 2016-05-09 MED ORDER — ORAL CARE MOUTH RINSE
15.0000 mL | Freq: Two times a day (BID) | OROMUCOSAL | Status: DC
  Administered 2016-05-09 – 2016-05-12 (×3): 15 mL via OROMUCOSAL

## 2016-05-09 MED ORDER — HYDROMORPHONE HCL 1 MG/ML IJ SOLN: 1.0000 mg | Freq: Once | INTRAMUSCULAR | Status: DC

## 2016-05-09 MED ORDER — TRAZODONE HCL 50 MG PO TABS
50.0000 mg | ORAL_TABLET | Freq: Every day | ORAL | Status: DC
  Administered 2016-05-09: 50 mg via ORAL
  Filled 2016-05-09: qty 1

## 2016-05-09 MED ORDER — MORPHINE SULFATE (PF) 4 MG/ML IV SOLN
4.0000 mg | Freq: Once | INTRAVENOUS | Status: AC
  Administered 2016-05-09: 4 mg via INTRAVENOUS
  Filled 2016-05-09: qty 1

## 2016-05-09 MED ORDER — ALBUTEROL SULFATE (2.5 MG/3ML) 0.083% IN NEBU: 3.0000 mL | INHALATION_SOLUTION | Freq: Four times a day (QID) | RESPIRATORY_TRACT | Status: DC | PRN

## 2016-05-09 MED ORDER — TIOTROPIUM BROMIDE MONOHYDRATE 18 MCG IN CAPS
18.0000 ug | ORAL_CAPSULE | Freq: Every day | RESPIRATORY_TRACT | Status: DC
  Administered 2016-05-10 – 2016-05-12 (×3): 18 ug via RESPIRATORY_TRACT
  Filled 2016-05-09: qty 5

## 2016-05-09 MED ORDER — DEXTROSE 5 % IV SOLN
2.0000 g | INTRAVENOUS | Status: DC
  Administered 2016-05-10 – 2016-05-12 (×3): 2 g via INTRAVENOUS
  Filled 2016-05-09 (×4): qty 2

## 2016-05-09 MED ORDER — HYDROMORPHONE HCL 1 MG/ML IJ SOLN
1.0000 mg | INTRAMUSCULAR | Status: AC | PRN
  Administered 2016-05-09 (×2): 1 mg via INTRAVENOUS
  Filled 2016-05-09 (×2): qty 1

## 2016-05-09 MED ORDER — DEXTROSE 5 % IV SOLN: 2.0000 g | INTRAVENOUS | Status: DC

## 2016-05-09 MED ORDER — CEFTRIAXONE SODIUM 2 G IJ SOLR
2.0000 g | Freq: Once | INTRAMUSCULAR | Status: AC
  Administered 2016-05-09: 2 g via INTRAVENOUS
  Filled 2016-05-09: qty 2

## 2016-05-09 MED ORDER — FLUTICASONE FUROATE-VILANTEROL 100-25 MCG/INH IN AEPB
1.0000 | INHALATION_SPRAY | Freq: Every day | RESPIRATORY_TRACT | Status: DC
  Administered 2016-05-10 – 2016-05-12 (×3): 1 via RESPIRATORY_TRACT
  Filled 2016-05-09: qty 28

## 2016-05-09 MED ORDER — TECHNETIUM TC 99M MEBROFENIN IV KIT
5.0000 | PACK | Freq: Once | INTRAVENOUS | Status: AC | PRN
  Administered 2016-05-09: 5 via INTRAVENOUS

## 2016-05-09 NOTE — Consult Note (Signed)
   [facility] Inpatient Consult   05/09/2016  Tiffany [name] Oct 30, 1960 [id number]    Made aware of hospitalization by [facility] RNCM. Please see chart review then notes for further patient outreach details.  Went to bedside to speak with [name] as she is active with [place] [facility] program. States she is supposed to have a drain placed tomorrow. [name] agreeable to ongoing [place] [facility] follow up. Will make inpatient [facility] Care Management is active.   Marthenia Rolling, MSN-Ed, RN,BSN [facility] Liaison [phone]

## 2016-05-09 NOTE — ED Notes (Signed)
Patient transported to X-ray 

## 2016-05-09 NOTE — H&P (Signed)
[facility] Admission History and Physical Service Pager: [phone]  Patient name: Tiffany [name] Medical record number: [id number] Date of birth: 22-Aug-1960 Age: 56 y.o. Gender: female  Primary Care Provider: Kathrine Cords, MD Consultants: General Surgery Code Status: Full  Chief Complaint: RUQ Abdominal Pain  Assessment and Plan: Tiffany [name] is a 56 y.o. female presenting with right upper quadrant abdominal pain and admitted for cholecystitis. PMH is significant for CKD, HFpEF, COPD, diabetes, OSA, Bipolar, hyperlipidemia.   # Cholecystitis: RUQ abdominal pain.Troponin negative. Leukocytosis to 11.9 today. CXR with no acute cardiopulmonary process. Abdominal US with cholelithiasis and suspicion for cholecystitis. Afebrile. ACS NSQIP calculation with above average risk in all categories, most significantly 8.8% risk of serious complication and 03.5% risk of readmission. General Surgery consulted by ED.  - Admit to [facility] Medicine Teaching Service, Dr. Erin Hearing attending - NPO - Vital signs per floor protocol - General Surgery consulted, appreciate recommendations. Recommend HIDA scan and IR perc drain. High risk for surgery. - IV Dilaudid 86m q1hr as needed pain.  - Ceftriaxone (4/24>>) - Holding most home medications due to NPO status. Hopeful these can be restarted later today. - SCDs. Holding pharmacologic VTE prophylaxis due to possible surgery - Consider physical therapy/OT consult following surgical evaluation and intervention  # Anemia: Hemoglobin 10.2 at admission. MCV normal at 93.8. Most likely secondary to CKD. - Monitor on CBC  # CKD: Baseline ~1.3-1.4. Creatinine 1.08 at admission. - Monitor on BMP  # HFpEF: Echo in 03/2016 with EF60-65% and grade 2 diastolic dysfunction, moderate LV hypertrophy. On home lasix 822mtwice daily. BNP 15.4 at admission. - Continue to monitor.  - Holding home lasix. Restart IV lasix if fluid status  worsens.   # COPD: On 6L [place] O2 at baseline. Followed by Pulmonology. - Continue home medications: Albuterol, Breo, Spiriva  # DM2: Last A1C 5.8 in 03/2016. Home medications include Metformin - Holding home Metformin - NPO. Holding sliding scale for now, but consider once taking PO - Monitor blood sugars  # OSA: CPAP at night.  # Bipolar: Home medications include Geodon, Remeron, and Lexapro - Holding home medications. Restart once taking PO  # HLD: Home medications include Lipitor  - Holding home Lipitor. Restart once taking PO  FEN/GI: NPO Prophylaxis: SCD  Disposition: Home  History of Present Illness:  Tiffany MULLIGANs a [age].o. female presenting with right upper quadrant abdominal pain. Reports symptoms started around 6pm last night and has worsened since that time. Denies history of prior. Pain is sharp and only located in right upper quadrant. Denies nausea, vomiting, fever, cough. Does reports some constipation, unchanged from her baseline. Shortness of breath not changed from baseline. History of COPD noted and on 6L [place] at baseline. Reports she has not tolerated anesthesia well in the past.   Review Of Systems: Per HPI  ROS  Patient Active Problem List   Diagnosis Date Noted  . Acute cholecystitis 05/09/2016  . QT prolongation   . OSA and COPD overlap syndrome (HCM[place]  . Generalized weakness   . Arthritis   . Bipolar I disorder (HCDiamond  . Hypokalemia 04/21/2016  . Wheezing 04/12/2016  . (HFpEF) heart failure with preserved ejection fraction (HCFranklin  . Benign essential HTN 03/22/2016  . Rectal bleeding 12/31/2015  . Bipolar affective disorder, mixed, severe, with psychotic behavior (HCCaspian11/12/2015  . Cocaine use disorder, severe, dependence (HCStockbridge11/12/2015  . Hyponatremia 11/25/2015  . AKI (acute kidney  injury) ([place]) 11/24/2015  . Blood in sputum   . Chronic diastolic heart failure ([place])   . SOB (shortness of breath)   . Acute on chronic respiratory failure  with hypoxia and hypercapnia ([place]) 06/22/2015  . Onychomycosis 01/21/2015  . Nasal congestion 12/17/2014  . Tobacco use disorder 07/22/2014  . COPD (chronic obstructive pulmonary disease) ([place]) 07/08/2014  . Seborrheic keratoses 12/31/2013  . Knee pain, bilateral 01/22/2013  . Seizure ([place]) 01/04/2013  . Health care maintenance 11/25/2012  . Folliculitis 49/44/9675  . History of kidney stones 06/18/2012  . Chronic pain syndrome 06/18/2012  . Muscle spasm 06/18/2012  . Elevated troponin 04/28/2012  . Allergic reaction 04/07/2012  . Itching 09/06/2011  . HSV infection 08/30/2011  . Dyslipidemia 04/24/2011  . Anemia 04/24/2011  . Diabetic neuropathy ([place]) 04/24/2011  . Obstructive sleep apnea 10/18/2010  . Asthma 10/18/2010  . Morbid obesity ([place]) 10/18/2010  . Type 2 diabetes mellitus ([place]) 10/18/2010    Past Medical History: Past Medical History:  Diagnosis Date  . Anxiety   . Arthritis    "all over" (04/10/2016)  . Asthma   . CHF (congestive heart failure) ([place])   . Chronic bronchitis ([place])   . Chronic kidney disease    "I see a kidney dr." (04/10/2016)  . Cocaine abuse   . Complication of anesthesia    decreased bp, decreased heart rate  . Depression   . Disorder of nervous system   . Emphysema   . GERD (gastroesophageal reflux disease)   . Heart attack ([place]) 1980s  . History of blood transfusion 1994   "couldn't stop bleeding from my period"  . Hyperlipidemia LDL goal <70   . Hypertension   . Incontinence   . Manic depression ([place])   . On home oxygen therapy    "6L; 24/7" (04/10/2016)  . OSA on CPAP    "wear mask sometimes" (04/10/2016)  . Paranoid ([place])    "sometimes; I'm on RX for it" (04/10/2016)  . Pneumonia    "I've had it several times; haven't had it since 06/2015" (04/10/2016)  . Schizophrenia ([place])   . Seasonal allergies   . Seizures ([place])    "don't know what kind; last one was ~ 1 yr ago" (04/10/2016)  . Sinus trouble   . Stroke [place]) 1980s    denies residual on 04/10/2016  . Type II diabetes mellitus ([place])     Past Surgical History: Past Surgical History:  Procedure Laterality Date  . CESAREAN SECTION  1997  . HERNIA REPAIR    . UMBILICAL HERNIA REPAIR  ~ 1963   "that's why I don't have a belly button"  . VAGINAL HYSTERECTOMY      Social History: Social History  Substance Use Topics  . Smoking status: Former Smoker    Packs/day: 1.50    Years: 38.00    Types: Cigarettes    Start date: 03/13/1977  . Smokeless tobacco: Never Used  . Alcohol use No   Please also refer to relevant sections of EMR.  Family History: Family History  Problem Relation Age of Onset  . Cancer Father     prostate  . Cancer Mother     lung  . Depression Mother   . Depression Sister   . Anxiety disorder Sister   . Schizophrenia Sister   . Bipolar disorder Sister   . Depression Sister   . Depression Brother   . Heart failure      cousin   Allergies and Medications:  Allergies  Allergen Reactions  . Hydroxyzine Shortness Of Breath    -throat closed up  . Latuda [Lurasidone Hcl] Anaphylaxis  . Codeine Nausea And Vomiting  . Sulfa Antibiotics Itching   No current facility-administered medications on file prior to encounter.    Current Outpatient Prescriptions on File Prior to Encounter  Medication Sig Dispense Refill  . acetaminophen (TYLENOL 8 HOUR) 650 MG CR tablet Take 1 tablet (650 mg total) by mouth every 8 (eight) hours as needed for pain. 60 tablet 0  . albuterol (PROVENTIL HFA;VENTOLIN HFA) 108 (90 Base) MCG/ACT inhaler Inhale 2 puffs into the lungs every 6 (six) hours as needed. 1 Inhaler 6  . albuterol (PROVENTIL) (2.5 MG/3ML) 0.083% nebulizer solution Take 3 mLs (2.5 mg total) by nebulization every 6 (six) hours as needed for wheezing or shortness of breath. 150 mL 1  . amLODipine (NORVASC) 5 MG tablet Take 1 tablet (5 mg total) by mouth daily. 90 tablet 0  . aspirin 81 MG EC tablet Take 1 tablet (81 mg total) by mouth  daily. Swallow whole. 30 tablet 1  . atorvastatin (LIPITOR) 40 MG tablet Take 1 tablet (40 mg total) by mouth daily. 30 tablet 1  . benztropine (COGENTIN) 0.5 MG tablet Take 1 tablet (0.5 mg total) by mouth daily. 30 tablet 2  . BREO ELLIPTA 100-25 MCG/INH AEPB INHALE 1 PUFF EVERY DAY 180 each 0  . divalproex ([name] ER) 500 MG 24 hr tablet Take 1 tablet (500 mg total) by mouth daily. (Patient taking differently: Take 500 mg by mouth at bedtime. ) 30 tablet 2  . escitalopram (LEXAPRO) 20 MG tablet Take 1 tablet (20 mg total) by mouth daily. 30 tablet 2  . furosemide (LASIX) 40 MG tablet Take 2 tablets (80 mg total) by mouth 2 (two) times daily. 120 tablet 0  . gabapentin (NEURONTIN) 600 MG tablet Take 1 tablet (600 mg total) by mouth 3 (three) times daily. 90 tablet 1  . metFORMIN (GLUCOPHAGE-XR) 500 MG 24 hr tablet TAKE 1 TABLET TWICE DAILY 180 tablet 0  . mirtazapine (REMERON) 30 MG tablet Take 1 tablet (30 mg total) by mouth at bedtime. 30 tablet 2  . montelukast (SINGULAIR) 10 MG tablet TAKE 1 TABLET AT BEDTIME 90 tablet 3  . Multiple Vitamins-Minerals (MULTIVITAMIN WITH MINERALS) tablet Take 1 tablet by mouth daily. Reported on 07/28/2015 30 tablet 1  . naproxen (NAPROSYN) 500 MG tablet Take 500 mg by mouth daily.    . nicotine (EQL NICOTINE) 21 mg/24hr patch Place 21 mg onto the skin daily.    [name] omeprazole (PRILOSEC) 40 MG capsule Take 1 capsule (40 mg total) by mouth daily. (Patient taking differently: Take 40 mg by mouth 2 (two) times daily. ) 30 capsule 1  . potassium chloride SA (K-DUR,KLOR-CON) 20 MEQ tablet Take 2 tablets (40 mEq total) by mouth 2 (two) times daily. 28 tablet 0  . tiotropium (SPIRIVA HANDIHALER) 18 MCG inhalation capsule Place 1 capsule (18 mcg total) into inhaler and inhale daily. 90 capsule 1  . ziprasidone (GEODON) 80 MG capsule Take 1 capsule (80 mg total) by mouth at bedtime. 30 capsule 2  . Alcohol Swabs (B-D SINGLE USE SWABS REGULAR) PADS CHECK CAPILLARY BLOOD  GLUCOSE ONE TIME DAILY 100 each 3  . Blood Glucose Monitoring Suppl (ACCU-CHEK AVIVA PLUS) w/Device KIT CHECK BLOOD SUGAR THREE TIMES DAILY 1 kit 0  . glucose blood test strip Use to check blood sugar 3 times daily. 100 each 12  .  OXYGEN Inhale 6 L into the lungs daily.    [name] PRESCRIPTION MEDICATION Inhale into the lungs at bedtime. CPAP      Objective: BP (!) 138/94   Pulse 95   Temp 97.6 F (36.4 C)   Resp 18   Wt (!) 301 lb (136.5 kg)   SpO2 99%   BMI 52.48 kg/m  Exam: General: chronically ill 56yo female resting comfortably in bed Eyes: PERRLA, white sclera ENTM: moist mucous membranes, clear oropharynx Neck: supple, no lymphadenopathy Cardiovascular: distant heart sounds, S1 and S2 noted, regular rhythm and rate Respiratory: Clear to auscultation bilaterally, no wheezes Gastrointestinal: Bowel sounds normal. Soft and nondistended. RUQ tenderness with + Murphy's.  MSK: 2+ pitting edema noted to bilateral lower extremities, moving all limbs spontaneously Derm: no rash, warm Neuro: no gross deficits, sensation intact Psych: AAOx3, poor insight  Labs and Imaging: CBC BMET   Recent Labs Lab 05/09/16 0419  WBC 11.9*  HGB 10.2*  HCT 34.6*  PLT 257    Recent Labs Lab 05/09/16 0419  NA 141  K 3.7  CL 99*  CO2 31  BUN 18  CREATININE 1.08*  GLUCOSE 105*  CALCIUM 9.4    - Troponin negative - BNP 15.4  Dg Chest 2 View  Result Date: 05/09/2016 CLINICAL DATA:  56 year old female with shortness of breath and right-sided chest pain. EXAM: CHEST  2 VIEW COMPARISON:  Chest radiograph dated 04/10/2016 FINDINGS: Top-normal cardiac size. The lungs are clear. There is no pleural effusion or pneumothorax. No acute osseous pathology. IMPRESSION: 1. No acute cardiopulmonary process. 2. Stable top-normal cardiac size. Electronically Signed   By: Anner Crete M.D.   On: 05/09/2016 05:14   X-ray Chest Pa And Lateral  Result Date: 04/10/2016 CLINICAL DATA:  Chronic shortness  of Breath EXAM: CHEST  2 VIEW COMPARISON:  April 05, 2016 FINDINGS: There is no edema or consolidation. Heart is borderline enlarged with pulmonary vascularity within normal limits. No adenopathy. There is degenerative change in thoracic spine. IMPRESSION: Borderline cardiac enlargement.  No edema or consolidation. Electronically Signed   By: Lowella Grip III M.D.   On: 04/10/2016 17:28   Abd 1 View (kub)  Result Date: 04/10/2016 CLINICAL DATA:  Constipation for 1 month. EXAM: ABDOMEN - 1 VIEW COMPARISON:  None FINDINGS: There is a marked stool burden identified throughout the colon. No dilated loops of small bowel identified. No abnormal abdominal or pelvic calcifications. IMPRESSION: 1. Marked stool burden identified within the colon compatible with the clinical history of constipation. Electronically Signed   By: Kerby Moors M.D.   On: 04/10/2016 17:28   US Abdomen Limited Ruq  Result Date: 05/09/2016 CLINICAL DATA:  56 year old female with right upper quadrant abdominal pain. EXAM: US ABDOMEN LIMITED - RIGHT UPPER QUADRANT COMPARISON:  Abdominal radiograph dated 04/10/2016 FINDINGS: Gallbladder: There multiple stones within the gallbladder. The gallbladder wall is thickened and edematous measuring 7 mm in thickness. There is trace pericholecystic fluid. Tenderness was elicited over the gallbladder area during scanning. Common bile duct: Diameter: 7 mm. Liver: No focal lesion identified. Within normal limits in parenchymal echogenicity. IMPRESSION: Cholelithiasis with findings suspicious for acute cholecystitis. A hepatobiliary scintigraphy may provide better evaluation of the gallbladder if clinically indicated. Electronically Signed   By: Anner Crete M.D.   On: 05/09/2016 06:48   Lorna Few, DO 05/09/2016, 8:14 AM PGY-3, [place] [facility] pager: [phone], text pages welcome

## 2016-05-09 NOTE — ED Notes (Signed)
Pt taking off blood pressure cuff, refusing to wear it

## 2016-05-09 NOTE — ED Provider Notes (Signed)
[facility] DEPT Provider Note   CSN: [id number] Arrival date & time: 05/09/16  0401     History   Chief Complaint Chief Complaint  Patient presents with  . Chest Pain    HPI Tiffany [name] is a 56 y.o. female.  The history is provided by the patient.  Patient presents to the emergency department complaining of 24 hours of increasing upper abdominal discomfort.  She's never had discomfort or pain like this before.  She does have a history of COPD on 6 L nasal cannula as well as congestive heart failure.  She reports taking her Lasix at home but does report some weight gain.  She reports nausea without vomiting.  She denies diarrhea.  Denies fevers or chills.  Pain in her upper abdomen is moderate in severity.  Pain is worsened by palpation and a right upper quadrant.           Past Medical History:  Diagnosis Date  . Anxiety   . Arthritis    "all over" (04/10/2016)  . Asthma   . CHF (congestive heart failure) ([place])   . Chronic bronchitis ([place])   . Chronic kidney disease    "I see a kidney dr." (04/10/2016)  . Cocaine abuse   . Complication of anesthesia    decreased bp, decreased heart rate  . Depression   . Disorder of nervous system   . Emphysema   . GERD (gastroesophageal reflux disease)   . Heart attack ([place]) 1980s  . History of blood transfusion 1994   "couldn't stop bleeding from my period"  . Hyperlipidemia LDL goal <70   . Hypertension   . Incontinence   . Manic depression ([place])   . On home oxygen therapy    "6L; 24/7" (04/10/2016)  . OSA on CPAP    "wear mask sometimes" (04/10/2016)  . Paranoid ([place])    "sometimes; I'm on RX for it" (04/10/2016)  . Pneumonia    "I've had it several times; haven't had it since 06/2015" (04/10/2016)  . Schizophrenia ([place])   . Seasonal allergies   . Seizures ([place])    "don't know what kind; last one was ~ 1 yr ago" (04/10/2016)  . Sinus trouble   . Stroke [facility]) 1980s   denies residual on 04/10/2016  . Type II  diabetes mellitus [facility])     Patient Active Problem List   Diagnosis Date Noted  . QT prolongation   . OSA and COPD overlap syndrome ([place])   . Generalized weakness   . Arthritis   . Bipolar I disorder ([place])   . Hypokalemia 04/21/2016  . Wheezing 04/12/2016  . (HFpEF) heart failure with preserved ejection fraction ([place])   . Benign essential HTN 03/22/2016  . Rectal bleeding 12/31/2015  . Bipolar affective disorder, mixed, severe, with psychotic behavior ([place]) 11/28/2015  . Cocaine use disorder, severe, dependence ([place]) 11/28/2015  . Hyponatremia 11/25/2015  . AKI (acute kidney injury) ([place]) 11/24/2015  . Blood in sputum   . Chronic diastolic heart failure ([place])   . SOB (shortness of breath)   . Acute on chronic respiratory failure with hypoxia and hypercapnia ([place]) 06/22/2015  . Onychomycosis 01/21/2015  . Nasal congestion 12/17/2014  . Tobacco use disorder 07/22/2014  . COPD (chronic obstructive pulmonary disease) ([place]) 07/08/2014  . Seborrheic keratoses 12/31/2013  . Knee pain, bilateral 01/22/2013  . Seizure ([place]) 01/04/2013  . Health care maintenance 11/25/2012  . Folliculitis 03/00/9233  . History of kidney stones 06/18/2012  . Chronic  pain syndrome 06/18/2012  . Muscle spasm 06/18/2012  . Elevated troponin 04/28/2012  . Allergic reaction 04/07/2012  . Itching 09/06/2011  . HSV infection 08/30/2011  . Dyslipidemia 04/24/2011  . Anemia 04/24/2011  . Diabetic neuropathy ([place]) 04/24/2011  . Obstructive sleep apnea 10/18/2010  . Asthma 10/18/2010  . Morbid obesity ([place]) 10/18/2010  . Type 2 diabetes mellitus ([place]) 10/18/2010    Past Surgical History:  Procedure Laterality Date  . CESAREAN SECTION  1997  . HERNIA REPAIR    . UMBILICAL HERNIA REPAIR  ~ 1963   "that's why I don't have a belly button"  . VAGINAL HYSTERECTOMY      OB History    No data available       Home Medications    Prior to Admission medications   Medication Sig Start Date End Date  Taking? Authorizing Provider  acetaminophen (TYLENOL 8 HOUR) 650 MG CR tablet Take 1 tablet (650 mg total) by mouth every 8 (eight) hours as needed for pain. 04/27/16  Yes Archie Patten, MD  albuterol (PROVENTIL HFA;VENTOLIN HFA) 108 (90 Base) MCG/ACT inhaler Inhale 2 puffs into the lungs every 6 (six) hours as needed. 12/05/15  Yes Jolanta B Pucilowska, MD  albuterol (PROVENTIL) (2.5 MG/3ML) 0.083% nebulizer solution Take 3 mLs (2.5 mg total) by nebulization every 6 (six) hours as needed for wheezing or shortness of breath. 02/18/16  Yes Hepzibah N Rumley, DO  amLODipine (NORVASC) 5 MG tablet Take 1 tablet (5 mg total) by mouth daily. 05/05/16  Yes Archie Patten, MD  aspirin 81 MG EC tablet Take 1 tablet (81 mg total) by mouth daily. Swallow whole. 12/05/15  Yes Jolanta B Pucilowska, MD  atorvastatin (LIPITOR) 40 MG tablet Take 1 tablet (40 mg total) by mouth daily. 12/05/15  Yes Jolanta B Pucilowska, MD  benztropine (COGENTIN) 0.5 MG tablet Take 1 tablet (0.5 mg total) by mouth daily. 03/31/16 03/31/17 Yes Kathlee Nations, MD  BREO ELLIPTA 100-25 MCG/INH AEPB INHALE 1 PUFF EVERY DAY 02/15/16  Yes Sela Hua, MD  divalproex ([name] ER) 500 MG 24 hr tablet Take 1 tablet (500 mg total) by mouth daily. Patient taking differently: Take 500 mg by mouth at bedtime.  03/31/16 03/31/17 Yes Kathlee Nations, MD  escitalopram (LEXAPRO) 20 MG tablet Take 1 tablet (20 mg total) by mouth daily. 03/31/16  Yes Kathlee Nations, MD  furosemide (LASIX) 40 MG tablet Take 2 tablets (80 mg total) by mouth 2 (two) times daily. 04/15/16  Yes Sela Hua, MD  gabapentin (NEURONTIN) 600 MG tablet Take 1 tablet (600 mg total) by mouth 3 (three) times daily. 12/05/15  Yes Jolanta B Pucilowska, MD  metFORMIN (GLUCOPHAGE-XR) 500 MG 24 hr tablet TAKE 1 TABLET TWICE DAILY 03/28/16  Yes Archie Patten, MD  mirtazapine (REMERON) 30 MG tablet Take 1 tablet (30 mg total) by mouth at bedtime. 03/31/16  Yes Kathlee Nations, MD  montelukast  (SINGULAIR) 10 MG tablet TAKE 1 TABLET AT BEDTIME 04/24/16  Yes Archie Patten, MD  Multiple Vitamins-Minerals (MULTIVITAMIN WITH MINERALS) tablet Take 1 tablet by mouth daily. Reported on 07/28/2015 12/05/15  Yes Jolanta B Pucilowska, MD  naproxen (NAPROSYN) 500 MG tablet Take 500 mg by mouth daily.   Yes Historical Provider, MD  nicotine (EQL NICOTINE) 21 mg/24hr patch Place 21 mg onto the skin daily.   Yes Historical Provider, MD  omeprazole (PRILOSEC) 40 MG capsule Take 1 capsule (40 mg total) by mouth daily. Patient  taking differently: Take 40 mg by mouth 2 (two) times daily.  12/05/15  Yes Jolanta B Pucilowska, MD  potassium chloride SA (K-DUR,KLOR-CON) 20 MEQ tablet Take 2 tablets (40 mEq total) by mouth 2 (two) times daily. 04/24/16 05/09/16 Yes Everrett Coombe, MD  tiotropium (SPIRIVA HANDIHALER) 18 MCG inhalation capsule Place 1 capsule (18 mcg total) into inhaler and inhale daily. 12/05/15  Yes Jolanta B Pucilowska, MD  ziprasidone (GEODON) 80 MG capsule Take 1 capsule (80 mg total) by mouth at bedtime. 03/31/16  Yes Kathlee Nations, MD  Alcohol Swabs (B-D SINGLE USE SWABS REGULAR) PADS CHECK CAPILLARY BLOOD GLUCOSE ONE TIME DAILY 02/03/16   Archie Patten, MD  Blood Glucose Monitoring Suppl (ACCU-CHEK AVIVA PLUS) w/Device KIT CHECK BLOOD SUGAR THREE TIMES DAILY 12/02/15   Archie Patten, MD  glucose blood test strip Use to check blood sugar 3 times daily. 12/05/15   Clovis Fredrickson, MD  OXYGEN Inhale 6 L into the lungs daily.    Historical Provider, MD  PRESCRIPTION MEDICATION Inhale into the lungs at bedtime. CPAP    Historical Provider, MD    Family History Family History  Problem Relation Age of Onset  . Cancer Father     prostate  . Cancer Mother     lung  . Depression Mother   . Depression Sister   . Anxiety disorder Sister   . Schizophrenia Sister   . Bipolar disorder Sister   . Depression Sister   . Depression Brother   . Heart failure      cousin    Social  History Social History  Substance Use Topics  . Smoking status: Former Smoker    Packs/day: 1.50    Years: 38.00    Types: Cigarettes    Start date: 03/13/1977  . Smokeless tobacco: Never Used  . Alcohol use No     Allergies   Hydroxyzine; Latuda [lurasidone hcl]; Codeine; and Sulfa antibiotics   Review of Systems Review of Systems  All other systems reviewed and are negative.    Physical Exam Updated Vital Signs BP (!) 148/94   Pulse 89   Temp 97.6 F (36.4 C)   Resp (!) 21   Wt (!) 301 lb (136.5 kg)   SpO2 99%   BMI 52.48 kg/m   Physical Exam  Constitutional: [name] oriented to person, place, and time. She appears well-developed and well-nourished. No distress.  HENT:  Head: Normocephalic and atraumatic.  Eyes: EOM are normal.  Neck: Normal range of motion.  Cardiovascular: Normal rate, regular rhythm and normal heart sounds.   Pulmonary/Chest: Effort normal and breath sounds normal.  Abdominal: Soft. She exhibits no distension.  Right upper quadrant tenderness.  Musculoskeletal: Normal range of motion.  Neurological: [name] alert and oriented to person, place, and time.  Skin: Skin is warm and dry.  Psychiatric: She has a normal mood and affect. Judgment normal.  Nursing note and vitals reviewed.    ED Treatments / Results  Labs (all labs ordered are listed, but only abnormal results are displayed) Labs Reviewed  BASIC METABOLIC PANEL - Abnormal; Notable for the following:       Result Value   Chloride 99 (*)    Glucose, Bld 105 (*)    Creatinine, Ser 1.08 (*)    GFR calc non Af Amer 56 (*)    All other components within normal limits  CBC - Abnormal; Notable for the following:    WBC 11.9 (*)  RBC 3.69 (*)    Hemoglobin 10.2 (*)    HCT 34.6 (*)    MCHC 29.5 (*)    RDW 15.7 (*)    All other components within normal limits  HEPATIC FUNCTION PANEL - Abnormal; Notable for the following:    Total Protein 6.1 (*)    Albumin 3.4 (*)     Bilirubin, Direct <0.1 (*)    All other components within normal limits  BRAIN NATRIURETIC PEPTIDE  LIPASE, BLOOD  I-STAT TROPOININ, ED    EKG  EKG Interpretation  Date/Time:  Tuesday May 09 2016 04:08:06 EDT Ventricular Rate:  91 PR Interval:  160 QRS Duration: 62 QT Interval:  378 QTC Calculation: 464 R Axis:   38 Text Interpretation:  Normal sinus rhythm Abnormal QRS-T angle, consider primary T wave abnormality Abnormal ECG No significant change was found Confirmed by Graceann Boileau  MD, Lennette Bihari ([id number]) on 05/09/2016 7:00:57 AM       Radiology Dg Chest 2 View  Result Date: 05/09/2016 CLINICAL DATA:  57 year old female with shortness of breath and right-sided chest pain. EXAM: CHEST  2 VIEW COMPARISON:  Chest radiograph dated 04/10/2016 FINDINGS: Top-normal cardiac size. The lungs are clear. There is no pleural effusion or pneumothorax. No acute osseous pathology. IMPRESSION: 1. No acute cardiopulmonary process. 2. Stable top-normal cardiac size. Electronically Signed   By: Anner Crete M.D.   On: 05/09/2016 05:14   US Abdomen Limited Ruq  Result Date: 05/09/2016 CLINICAL DATA:  56 year old female with right upper quadrant abdominal pain. EXAM: US ABDOMEN LIMITED - RIGHT UPPER QUADRANT COMPARISON:  Abdominal radiograph dated 04/10/2016 FINDINGS: Gallbladder: There multiple stones within the gallbladder. The gallbladder wall is thickened and edematous measuring 7 mm in thickness. There is trace pericholecystic fluid. Tenderness was elicited over the gallbladder area during scanning. Common bile duct: Diameter: 7 mm. Liver: No focal lesion identified. Within normal limits in parenchymal echogenicity. IMPRESSION: Cholelithiasis with findings suspicious for acute cholecystitis. A hepatobiliary scintigraphy may provide better evaluation of the gallbladder if clinically indicated. Electronically Signed   By: Anner Crete M.D.   On: 05/09/2016 06:48    Procedures Procedures (including  critical care time)  Medications Ordered in ED Medications  HYDROmorphone (DILAUDID) injection 1 mg (not administered)  cefTRIAXone (ROCEPHIN) 2 g in dextrose 5 % 50 mL IVPB (not administered)  morphine 4 MG/ML injection 4 mg (4 mg Intravenous Given 05/09/16 0545)     Initial Impression / Assessment and Plan / ED Course  I have reviewed the triage vital signs and the nursing notes.  Pertinent labs & imaging results that were available during my care of the patient were reviewed by me and considered in my medical decision making (see chart for details).    Patient appears to have acute cholecystitis with noted cholelithiasis.  Focal tenderness in the right upper quadrant.  Patient be started on antibiotics at this time.  [name] not a great surgical candidate however given her COPD, congestive heart failure, oxygen dependence, morbid obesity.  I will discuss the case with general surgery but the patient will likely benefit from admission to the hospitalist team   Final Clinical Impressions(s) / ED Diagnoses   Final diagnoses:  Upper abdominal pain  Cholecystitis  Gallstones    New Prescriptions New Prescriptions   No medications on file     Jola Schmidt, MD 05/09/16 [phone]

## 2016-05-09 NOTE — Consult Note (Signed)
[facility] Surgery Consult Note  Tiffany [name] 08-Jul-1960  [id number].    Requesting MD: Venora Maples, MD Chief Complaint/Reason for Consult: acute cholecystitis  HPI:  Tiffany [name] is a 56 y/o AA female with MMP including, but not limited to, obesity, cholelithiasis, CHF, and COPD on 6L home O2 who presents to [facility] with RUQ abdominal pain. She states that the pain started yesterday around 6:00 PM and is described as sharp and constant. She denies similar pain in the past but has a known history of gallstones. In addition to being worried about her pain she was concerned about her weight - at her pulmonologist appointment yesterday she reports she had gained 23 lbs and had pitting edema of her lower extremities. She spoke with a family medical resident over the phone, Dr. Lorenso Courier, who advised her to take an addition dose (40 mg) of Lasix. The lasix improved her edema but not her pain. She tried to go to sleep, hoping the pain would improve, but it only became worse so she asked her son bring her to the emergency room. She denies fever, chills, CP, worsening SOB, nausea, vomiting, or changes in bowel habits. Her last meal was yesterday evening. She denies use of blood thinners other than a daily baby ASA. Her surgical history includes umbilical hernia repair, cesarean section, and vaginal hysterectomy.   ED workup: leukocytosis (WBC 11.9), normocytic anemia (hgb 10.2, hct 34.6) LFT's, lipase, troponin, BNP are WNL RUQ U/S w/ cholelithiasis, gallbladder wall thickening (7 mm) and edema, pericholecystic fluid, and tenderness.  ROS: Review of Systems  Constitutional: Negative for chills and fever.  Cardiovascular: Positive for leg swelling.  Gastrointestinal: Positive for abdominal pain. Negative for blood in stool, constipation, diarrhea, heartburn, melena, nausea and vomiting.  All other systems reviewed and are negative.   Family History  Problem Relation Age of Onset  . Cancer Father      prostate  . Cancer Mother     lung  . Depression Mother   . Depression Sister   . Anxiety disorder Sister   . Schizophrenia Sister   . Bipolar disorder Sister   . Depression Sister   . Depression Brother   . Heart failure      cousin    Past Medical History:  Diagnosis Date  . Anxiety   . Arthritis    "all over" (04/10/2016)  . Asthma   . CHF (congestive heart failure) ([place])   . Chronic bronchitis ([place])   . Chronic kidney disease    "I see a kidney dr." (04/10/2016)  . Cocaine abuse   . Complication of anesthesia    decreased bp, decreased heart rate  . Depression   . Disorder of nervous system   . Emphysema   . GERD (gastroesophageal reflux disease)   . Heart attack ([place]) 1980s  . History of blood transfusion 1994   "couldn't stop bleeding from my period"  . Hyperlipidemia LDL goal <70   . Hypertension   . Incontinence   . Manic depression ([place])   . On home oxygen therapy    "6L; 24/7" (04/10/2016)  . OSA on CPAP    "wear mask sometimes" (04/10/2016)  . Paranoid ([place])    "sometimes; I'm on RX for it" (04/10/2016)  . Pneumonia    "I've had it several times; haven't had it since 06/2015" (04/10/2016)  . Schizophrenia ([place])   . Seasonal allergies   . Seizures ([place])    "don't know what kind; last one was ~  1 yr ago" (04/10/2016)  . Sinus trouble   . Stroke [facility]) 1980s   denies residual on 04/10/2016  . Type II diabetes mellitus ([place])     Past Surgical History:  Procedure Laterality Date  . CESAREAN SECTION  1997  . HERNIA REPAIR    . UMBILICAL HERNIA REPAIR  ~ 1963   "that's why I don't have a belly button"  . VAGINAL HYSTERECTOMY      Social History:  reports that she has quit smoking. Her smoking use included Cigarettes. She started smoking about 39 years ago. She has a 57.00 pack-year smoking history. She has never used smokeless tobacco. She reports that she uses drugs, including Cocaine. She reports that she does not drink alcohol.  Allergies:  Allergies   Allergen Reactions  . Hydroxyzine Shortness Of Breath    -throat closed up  . Latuda [Lurasidone Hcl] Anaphylaxis  . Codeine Nausea And Vomiting  . Sulfa Antibiotics Itching     (Not in a hospital admission)  Blood pressure 110/90, pulse 93, temperature 97.6 F (36.4 C), resp. rate (!) 21, weight (!) 136.5 kg (301 lb), SpO2 (!) 83 %. Physical Exam: Physical Exam  Constitutional: [name] oriented to person, place, and time. She appears well-developed and well-nourished. No distress.  In obvious abdominal discomfort  HENT:  Head: Normocephalic and atraumatic.  Right Ear: External ear normal.  Left Ear: External ear normal.  Mouth/Throat: Oropharynx is clear and moist.  Eyes: EOM are normal. Pupils are equal, round, and reactive to light. No scleral icterus.  Neck: Normal range of motion. Neck supple. No tracheal deviation present.  Cardiovascular: Normal rate, regular rhythm, normal heart sounds and intact distal pulses.  Exam reveals no gallop and no friction rub.   No murmur heard. Pulmonary/Chest: No stridor. No respiratory distress. She has wheezes. She has no rales. She exhibits no tenderness.  Respiratory effort is slightly increased on [place]  Abdominal: Soft. Bowel sounds are normal. She exhibits distension. She exhibits no mass. There is tenderness (RUQ). There is no rebound and no guarding. No hernia.  Musculoskeletal: Normal range of motion. She exhibits no tenderness or deformity.  Neurological: [name] alert and oriented to person, place, and time. No sensory deficit.  Skin: Skin is warm and dry. [name] not diaphoretic. No erythema.  Psychiatric: She has a normal mood and affect. Her behavior is normal.    Results for orders placed or performed during the hospital encounter of 05/09/16 (from the past 48 hour(s))  Basic metabolic panel     Status: Abnormal   Collection Time: 05/09/16  4:19 AM  Result Value Ref Range   Sodium 141 135 - 145 mmol/L   Potassium 3.7 3.5 - 5.1  mmol/L   Chloride 99 (L) 101 - 111 mmol/L   CO2 31 22 - 32 mmol/L   Glucose, Bld 105 (H) 65 - 99 mg/dL   BUN 18 6 - 20 mg/dL   Creatinine, Ser 1.08 (H) 0.44 - 1.00 mg/dL   Calcium 9.4 8.9 - 10.3 mg/dL   GFR calc non Af Amer 56 (L) >60 mL/min   GFR calc Af Amer >60 >60 mL/min    Comment: (NOTE) The eGFR has been calculated using the CKD EPI equation. This calculation has not been validated in all clinical situations. eGFR's persistently <60 mL/min signify possible Chronic Kidney Disease.    Anion gap 11 5 - 15  CBC     Status: Abnormal   Collection Time: 05/09/16  4:19 AM  Result Value Ref Range   WBC 11.9 (H) 4.0 - 10.5 K/uL   RBC 3.69 (L) 3.87 - 5.11 MIL/uL   Hemoglobin 10.2 (L) 12.0 - 15.0 g/dL   HCT 34.6 (L) 36.0 - 46.0 %   MCV 93.8 78.0 - 100.0 fL   MCH 27.6 26.0 - 34.0 pg   MCHC 29.5 (L) 30.0 - 36.0 g/dL   RDW 15.7 (H) 11.5 - 15.5 %   Platelets 257 150 - 400 K/uL  Brain natriuretic peptide     Status: None   Collection Time: 05/09/16  4:19 AM  Result Value Ref Range   B Natriuretic Peptide 15.4 0.0 - 100.0 pg/mL  Hepatic function panel     Status: Abnormal   Collection Time: 05/09/16  4:19 AM  Result Value Ref Range   Total Protein 6.1 (L) 6.5 - 8.1 g/dL   Albumin 3.4 (L) 3.5 - 5.0 g/dL   AST 22 15 - 41 U/L   ALT 18 14 - 54 U/L   Alkaline Phosphatase 101 38 - 126 U/L   Total Bilirubin 0.3 0.3 - 1.2 mg/dL   Bilirubin, Direct <0.1 (L) 0.1 - 0.5 mg/dL   Indirect Bilirubin NOT CALCULATED 0.3 - 0.9 mg/dL  Lipase, blood     Status: None   Collection Time: 05/09/16  4:19 AM  Result Value Ref Range   Lipase 33 11 - 51 U/L  I-stat troponin, ED     Status: None   Collection Time: 05/09/16  4:30 AM  Result Value Ref Range   Troponin i, poc 0.00 0.00 - 0.08 ng/mL   Comment 3            Comment: Due to the release kinetics of cTnI, a negative result within the first hours of the onset of symptoms does not rule out myocardial infarction with certainty. If myocardial  infarction is still suspected, repeat the test at appropriate intervals.    Dg Chest 2 View  Result Date: 05/09/2016 CLINICAL DATA:  56 year old female with shortness of breath and right-sided chest pain. EXAM: CHEST  2 VIEW COMPARISON:  Chest radiograph dated 04/10/2016 FINDINGS: Top-normal cardiac size. The lungs are clear. There is no pleural effusion or pneumothorax. No acute osseous pathology. IMPRESSION: 1. No acute cardiopulmonary process. 2. Stable top-normal cardiac size. Electronically Signed   By: Anner Crete M.D.   On: 05/09/2016 05:14   US Abdomen Limited Ruq  Result Date: 05/09/2016 CLINICAL DATA:  56 year old female with right upper quadrant abdominal pain. EXAM: US ABDOMEN LIMITED - RIGHT UPPER QUADRANT COMPARISON:  Abdominal radiograph dated 04/10/2016 FINDINGS: Gallbladder: There multiple stones within the gallbladder. The gallbladder wall is thickened and edematous measuring 7 mm in thickness. There is trace pericholecystic fluid. Tenderness was elicited over the gallbladder area during scanning. Common bile duct: Diameter: 7 mm. Liver: No focal lesion identified. Within normal limits in parenchymal echogenicity. IMPRESSION: Cholelithiasis with findings suspicious for acute cholecystitis. A hepatobiliary scintigraphy may provide better evaluation of the gallbladder if clinically indicated. Electronically Signed   By: Anner Crete M.D.   On: 05/09/2016 06:48   Assessment/Plan RUQ Pain suspect calculous cholecystitis  - WBC 11.9 - RUQ U/S w/ cholelithiasis, wall thickening, pericholecystic fluid  - due to obesity, CHF, and pulmonary disease patient is a high risk surgical candidate. Recommend IV abx, HIDA scan to confirm cholecystitis followed by percutaneous cholecystostomy placement.   FEN - NPO, IVF  ID - IV Rocephin VTE - SCD's   Diabetes  [place] HLD HTN CHF COPD on 6 L home O2- followed by Barnett Abu, MD  Asthma OSA on CPAP  Morbid obesity    Tiffany [name], [facility] Surgery 05/09/2016, 7:54 AM Pager: [phone] Consults: [phone] Mon-Fri 7:00 am-4:30 pm Sat-Sun 7:00 am-11:30 am

## 2016-05-09 NOTE — Progress Notes (Signed)
Patient arrived on unit from ED via stretcher no family at bedside.  Telemetry placed per MD order and CMT notified.

## 2016-05-09 NOTE — Patient Outreach (Signed)
[facility]) Care Management  05/09/2016  Tiffany [name] Mar 04, 1960 [id number]   Notified that member was readmitted to hospital for cholecystitis.  Hospital liaisons notified, will follow up after discharge.  [name], [place], MSN [place] [phone]

## 2016-05-09 NOTE — ED Triage Notes (Addendum)
Pt from home c/o sob, R sided cp x 1 day. Hx of CHF, COPD, recent hospitalization 1 wk ago. Pt states she takes Lasix at home, but weight has gone from 277 to 301lbs in 1 wk. Denies n/v, fever or cough. c/o abdominal tightness. +2 pedal edema, lungs CTA, sats 97% on 6L

## 2016-05-09 NOTE — Consult Note (Signed)
Chief Complaint: Patient was seen in consultation today for abdominal pain  Referring Physician(s):  Dr. Erroll Luna  Supervising Physician: Corrie Mckusick  Patient Status: [facility] - In-pt  History of Present Illness: Tiffany [name] is a 56 y.o. female with past medical history of CHF, DM, GERD, HLD, HTN, asthma, COPD, and emphysema who presented to [facility] with acute abdominal pain.   Abd [name] 05/09/16 shows: Cholelithiasis with findings suspicious for acute cholecystitis. A hepatobiliary scintigraphy may provide better evaluation of the gallbladder if clinically indicated.  HIDA scan 05/09/16 shows: No opacification of the cystic duct or gallbladder, supporting the presence of acute cholecystitis. Specificity decreased by lack of post morphine imaging.  IR consulted for percutaneous cholecystostomy placement in patient who is at high risk of surgery due to respiratory status.  Case reviewed by Dr. Earleen Newport who feels patient is appropriate for procedure.    Past Medical History:  Diagnosis Date  . Anxiety   . Arthritis    "all over" (04/10/2016)  . Asthma   . CHF (congestive heart failure) ([place])   . Chronic bronchitis ([place])   . Chronic kidney disease    "I see a kidney dr." (04/10/2016)  . Cocaine abuse   . Complication of anesthesia    decreased bp, decreased heart rate  . Depression   . Disorder of nervous system   . Emphysema   . GERD (gastroesophageal reflux disease)   . Heart attack ([place]) 1980s  . History of blood transfusion 1994   "couldn't stop bleeding from my period"  . Hyperlipidemia LDL goal <70   . Hypertension   . Incontinence   . Manic depression ([place])   . On home oxygen therapy    "6L; 24/7" (04/10/2016)  . OSA on CPAP    "wear mask sometimes" (04/10/2016)  . Paranoid ([place])    "sometimes; I'm on RX for it" (04/10/2016)  . Pneumonia    "I've had it several times; haven't had it since 06/2015" (04/10/2016)  . Schizophrenia ([place])   . Seasonal allergies     . Seizures ([place])    "don't know what kind; last one was ~ 1 yr ago" (04/10/2016)  . Sinus trouble   . Stroke [facility]) 1980s   denies residual on 04/10/2016  . Type II diabetes mellitus ([place])     Past Surgical History:  Procedure Laterality Date  . CESAREAN SECTION  1997  . HERNIA REPAIR    . UMBILICAL HERNIA REPAIR  ~ 1963   "that's why I don't have a belly button"  . VAGINAL HYSTERECTOMY      Allergies: Hydroxyzine; Latuda [lurasidone hcl]; Codeine; and Sulfa antibiotics  Medications: Prior to Admission medications   Medication Sig Start Date End Date Taking? Authorizing Provider  acetaminophen (TYLENOL 8 HOUR) 650 MG CR tablet Take 1 tablet (650 mg total) by mouth every 8 (eight) hours as needed for pain. 04/27/16  Yes Archie Patten, MD  albuterol (PROVENTIL HFA;VENTOLIN HFA) 108 (90 Base) MCG/ACT inhaler Inhale 2 puffs into the lungs every 6 (six) hours as needed. 12/05/15  Yes Jolanta B Pucilowska, MD  albuterol (PROVENTIL) (2.5 MG/3ML) 0.083% nebulizer solution Take 3 mLs (2.5 mg total) by nebulization every 6 (six) hours as needed for wheezing or shortness of breath. 02/18/16  Yes Hartwell N Rumley, DO  amLODipine (NORVASC) 5 MG tablet Take 1 tablet (5 mg total) by mouth daily. 05/05/16  Yes Archie Patten, MD  aspirin 81 MG EC tablet Take 1 tablet (  81 mg total) by mouth daily. Swallow whole. 12/05/15  Yes Jolanta B Pucilowska, MD  atorvastatin (LIPITOR) 40 MG tablet Take 1 tablet (40 mg total) by mouth daily. 12/05/15  Yes Jolanta B Pucilowska, MD  benztropine (COGENTIN) 0.5 MG tablet Take 1 tablet (0.5 mg total) by mouth daily. 03/31/16 03/31/17 Yes Kathlee Nations, MD  BREO ELLIPTA 100-25 MCG/INH AEPB INHALE 1 PUFF EVERY DAY 02/15/16  Yes Sela Hua, MD  divalproex ([name] ER) 500 MG 24 hr tablet Take 1 tablet (500 mg total) by mouth daily. Patient taking differently: Take 500 mg by mouth at bedtime.  03/31/16 03/31/17 Yes Kathlee Nations, MD  escitalopram (LEXAPRO) 20 MG tablet  Take 1 tablet (20 mg total) by mouth daily. 03/31/16  Yes Kathlee Nations, MD  furosemide (LASIX) 40 MG tablet Take 2 tablets (80 mg total) by mouth 2 (two) times daily. 04/15/16  Yes Sela Hua, MD  gabapentin (NEURONTIN) 600 MG tablet Take 1 tablet (600 mg total) by mouth 3 (three) times daily. 12/05/15  Yes Jolanta B Pucilowska, MD  metFORMIN (GLUCOPHAGE-XR) 500 MG 24 hr tablet TAKE 1 TABLET TWICE DAILY 03/28/16  Yes Archie Patten, MD  mirtazapine (REMERON) 30 MG tablet Take 1 tablet (30 mg total) by mouth at bedtime. 03/31/16  Yes Kathlee Nations, MD  montelukast (SINGULAIR) 10 MG tablet TAKE 1 TABLET AT BEDTIME 04/24/16  Yes Archie Patten, MD  Multiple Vitamins-Minerals (MULTIVITAMIN WITH MINERALS) tablet Take 1 tablet by mouth daily. Reported on 07/28/2015 12/05/15  Yes Jolanta B Pucilowska, MD  naproxen (NAPROSYN) 500 MG tablet Take 500 mg by mouth daily.   Yes Historical Provider, MD  nicotine (EQL NICOTINE) 21 mg/24hr patch Place 21 mg onto the skin daily.   Yes Historical Provider, MD  omeprazole (PRILOSEC) 40 MG capsule Take 1 capsule (40 mg total) by mouth daily. Patient taking differently: Take 40 mg by mouth 2 (two) times daily.  12/05/15  Yes Jolanta B Pucilowska, MD  potassium chloride SA (K-DUR,KLOR-CON) 20 MEQ tablet Take 2 tablets (40 mEq total) by mouth 2 (two) times daily. 04/24/16 05/09/16 Yes Everrett Coombe, MD  tiotropium (SPIRIVA HANDIHALER) 18 MCG inhalation capsule Place 1 capsule (18 mcg total) into inhaler and inhale daily. 12/05/15  Yes Jolanta B Pucilowska, MD  ziprasidone (GEODON) 80 MG capsule Take 1 capsule (80 mg total) by mouth at bedtime. 03/31/16  Yes Kathlee Nations, MD  Alcohol Swabs (B-D SINGLE USE SWABS REGULAR) PADS CHECK CAPILLARY BLOOD GLUCOSE ONE TIME DAILY 02/03/16   Archie Patten, MD  Blood Glucose Monitoring Suppl (ACCU-CHEK AVIVA PLUS) w/Device KIT CHECK BLOOD SUGAR THREE TIMES DAILY 12/02/15   Archie Patten, MD  glucose blood test strip Use to check blood  sugar 3 times daily. 12/05/15   Clovis Fredrickson, MD  OXYGEN Inhale 6 L into the lungs daily.    Historical Provider, MD  PRESCRIPTION MEDICATION Inhale into the lungs at bedtime. CPAP    Historical Provider, MD     Family History  Problem Relation Age of Onset  . Cancer Father     prostate  . Cancer Mother     lung  . Depression Mother   . Depression Sister   . Anxiety disorder Sister   . Schizophrenia Sister   . Bipolar disorder Sister   . Depression Sister   . Depression Brother   . Heart failure      cousin    Social History  Social History  . Marital status: Widowed    Spouse name: N/A  . Number of children: 3  . Years of education: N/A   Occupational History  . disabled     factory production   Social History Main Topics  . Smoking status: Former Smoker    Packs/day: 1.50    Years: 38.00    Types: Cigarettes    Start date: 03/13/1977  . Smokeless tobacco: Never Used  . Alcohol use No  . Drug use: Yes    Types: Cocaine     Comment: 04/10/2016 "last used cocaine back in November 2017"  . Sexual activity: No   Other Topics Concern  . None   Social History Narrative   Has 1 son, [name]   Lives with son and his boyfriend   Her house has ramps and handrails should she ever needs them.    Her mother lives down the street from her and is a good support person in addition to her son.   She drives herself, has private transportation.    Cocaine free since 02/24/16, smoke free since 04/10/16       Review of Systems  Constitutional: Positive for fever. Negative for fatigue.  Respiratory: Positive for cough (chronic) and shortness of breath (chronic).   Cardiovascular: Negative for chest pain.  Gastrointestinal: Positive for abdominal pain (RUQ).  Psychiatric/Behavioral: Negative for behavioral problems and confusion.    Vital Signs: BP (!) 167/84 (BP Location: Right Wrist)   Pulse 93   Temp 99.1 F (37.3 C) (Oral)   Resp 20   Ht '5\' 4"'  (1.626 m)   Wt  (!) 301 lb (136.5 kg)   SpO2 100%   BMI 51.67 kg/m   Physical Exam  Constitutional: [name] oriented to person, place, and time. She appears well-developed.  Cardiovascular: Normal rate and regular rhythm.   Pulmonary/Chest: No respiratory distress.  Chronic increased WOB, on 6L O2 at home, coarse breath sounds  Abdominal: Soft. There is tenderness (RUQ tender to palpation).  Neurological: [name] alert and oriented to person, place, and time.  Skin: Skin is warm and dry.  Psychiatric: She has a normal mood and affect. Her behavior is normal. Judgment and thought content normal.  Nursing note and vitals reviewed.   Mallampati Score:  MD Evaluation Airway: WNL Heart: WNL Abdomen: WNL Chest/ Lungs: WNL ASA  Classification: 3 Mallampati/Airway Score: Three  Imaging: Dg Chest 2 View  Result Date: 05/09/2016 CLINICAL DATA:  56 year old female with shortness of breath and right-sided chest pain. EXAM: CHEST  2 VIEW COMPARISON:  Chest radiograph dated 04/10/2016 FINDINGS: Top-normal cardiac size. The lungs are clear. There is no pleural effusion or pneumothorax. No acute osseous pathology. IMPRESSION: 1. No acute cardiopulmonary process. 2. Stable top-normal cardiac size. Electronically Signed   By: Anner Crete M.D.   On: 05/09/2016 05:14   X-ray Chest Pa And Lateral  Result Date: 04/10/2016 CLINICAL DATA:  Chronic shortness of Breath EXAM: CHEST  2 VIEW COMPARISON:  April 05, 2016 FINDINGS: There is no edema or consolidation. Heart is borderline enlarged with pulmonary vascularity within normal limits. No adenopathy. There is degenerative change in thoracic spine. IMPRESSION: Borderline cardiac enlargement.  No edema or consolidation. Electronically Signed   By: Lowella Grip III M.D.   On: 04/10/2016 17:28   Abd 1 View (kub)  Result Date: 04/10/2016 CLINICAL DATA:  Constipation for 1 month. EXAM: ABDOMEN - 1 VIEW COMPARISON:  None FINDINGS: There is a marked stool burden  identified throughout the colon. No dilated loops of small bowel identified. No abnormal abdominal or pelvic calcifications. IMPRESSION: 1. Marked stool burden identified within the colon compatible with the clinical history of constipation. Electronically Signed   By: Kerby Moors M.D.   On: 04/10/2016 17:28   Nm Hepatobiliary Liver Func  Result Date: 05/09/2016 CLINICAL DATA:  Right upper quadrant abdominal pain for 1 day. Leukocytosis. Sonographic findings suspicious for acute cholecystitis. EXAM: NUCLEAR MEDICINE HEPATOBILIARY IMAGING TECHNIQUE: Sequential images of the abdomen were obtained out to 60 minutes following intravenous administration of radiopharmaceutical. RADIOPHARMACEUTICALS:  5.1 mCi Tc-93m Choletec IV COMPARISON:  Ultrasound 05/09/2016. FINDINGS: Initial images demonstrate homogeneous hepatic activity and spontaneous filling of the biliary system. There is drainage into duodenum with some reflux into the stomach. No opacification of the gallbladder is identified. Patient refused morphine sulfate administration (typically given to promote contraction of the sphincter of Oddi and increased specificity of non filling of the gallbladder). IMPRESSION: No opacification of the cystic duct or gallbladder, supporting the presence of acute cholecystitis. Specificity decreased by lack of post morphine imaging. Electronically Signed   By: WRichardean SaleM.D.   On: 05/09/2016 15:31   UK[place]Abdomen Limited Ruq  Result Date: 05/09/2016 CLINICAL DATA:  56year old female with right upper quadrant abdominal pain. EXAM: [place]ABDOMEN LIMITED - RIGHT UPPER QUADRANT COMPARISON:  Abdominal radiograph dated 04/10/2016 FINDINGS: Gallbladder: There multiple stones within the gallbladder. The gallbladder wall is thickened and edematous measuring 7 mm in thickness. There is trace pericholecystic fluid. Tenderness was elicited over the gallbladder area during scanning. Common bile duct: Diameter: 7 mm. Liver: No  focal lesion identified. Within normal limits in parenchymal echogenicity. IMPRESSION: Cholelithiasis with findings suspicious for acute cholecystitis. A hepatobiliary scintigraphy may provide better evaluation of the gallbladder if clinically indicated. Electronically Signed   By: AAnner CreteM.D.   On: 05/09/2016 06:48    Labs:  CBC:  Recent Labs  04/22/16 0121 04/23/16 0423 04/24/16 0530 05/09/16 0419  WBC 11.7* 10.6* 10.4 11.9*  HGB 10.7* 10.4* 11.1* 10.2*  HCT 35.8* 34.5* 36.1 34.6*  PLT 349 306 323 257    COAGS: No results for input(s): INR, APTT in the last 8760 hours.  BMP:  Recent Labs  04/23/16 0423 04/23/16 1424 04/24/16 0530 05/09/16 0419  NA 144 142 142 141  K 3.3* 3.6 3.7 3.7  CL 98* 101 100* 99*  CO2 34* 34* 32 31  GLUCOSE 85 121* 88 105*  BUN 35* 27* 22* 18  CALCIUM 8.9 9.3 9.4 9.4  CREATININE 1.15* 1.08* 1.08* 1.08*  GFRNONAA 52* 56* 56* 56*  GFRAA >60 >60 >60 >60    LIVER FUNCTION TESTS:  Recent Labs  07/09/15 1325 11/24/15 2116 04/10/16 1650 04/22/16 1017 05/09/16 0419  BILITOT 1.5* 0.9 0.4  --  0.3  AST '15 18 20  ' --  22  ALT 15 12* 18  --  18  ALKPHOS 100 122 95  --  101  PROT 7.3 6.5 6.1*  --  6.1*  ALBUMIN 3.9 3.4* 3.3* 3.2* 3.4*    TUMOR MARKERS: No results for input(s): AFPTM, CEA, CA199, CHROMGRNA in the last 8760 hours.  Assessment and Plan: Acute cholecystitis Patient with past medical history of COPD, OSA, asthma, and emphysema presents with acute abdominal pain. HIDA scan shows filling defect consistent with acute cholecystitis. IR consulted for percutaneous cholecystostomy.  Case reviewed by Dr. WEarleen Newportwho approves patient for procedure.  Risks and benefits discussed with the patient  including, but not limited to bleeding, infection, gallbladder perforation, bile leak, sepsis or even death.  As well as possibility that tube may remain indefinitely if she is unable to undergo surgery. All of the patient's questions  were answered, patient is agreeable to proceed. Consent signed and in chart.  Thank you for this interesting consult.  I greatly enjoyed meeting TARONDA COMACHO and look forward to participating in their care.  A copy of this report was sent to the requesting provider on this date.  Electronically Signed: Docia Barrier 05/09/2016, 5:07 PM   I spent a total of 40 Minutes    in face to face in clinical consultation, greater than 50% of which was counseling/coordinating care for acute cholecystitis.

## 2016-05-09 NOTE — ED Notes (Signed)
Pt to be scanned in [place] around noon.

## 2016-05-09 NOTE — ED Notes (Signed)
Patient transported to Ultrasound 

## 2016-05-09 NOTE — Progress Notes (Signed)
IR is aware of the request for perc chole drain.  We will await HIDA scan results.  If this is positive then we can move forward with drain placement, if this is negative, then there is no need for a drain at this time.    Xxavier Noon E 05/09/2016

## 2016-05-10 ENCOUNTER — Telehealth (HOSPITAL_COMMUNITY): Payer: Self-pay

## 2016-05-10 ENCOUNTER — Encounter (HOSPITAL_COMMUNITY): Payer: Self-pay | Admitting: Interventional Radiology

## 2016-05-10 ENCOUNTER — Other Ambulatory Visit (HOSPITAL_COMMUNITY): Payer: Self-pay | Admitting: Psychiatry

## 2016-05-10 ENCOUNTER — Inpatient Hospital Stay (HOSPITAL_COMMUNITY): Payer: Medicare HMO

## 2016-05-10 DIAGNOSIS — E119 Type 2 diabetes mellitus without complications: Secondary | ICD-10-CM

## 2016-05-10 DIAGNOSIS — D72829 Elevated white blood cell count, unspecified: Secondary | ICD-10-CM

## 2016-05-10 DIAGNOSIS — K8020 Calculus of gallbladder without cholecystitis without obstruction: Secondary | ICD-10-CM

## 2016-05-10 DIAGNOSIS — K810 Acute cholecystitis: Secondary | ICD-10-CM

## 2016-05-10 DIAGNOSIS — K819 Cholecystitis, unspecified: Secondary | ICD-10-CM

## 2016-05-10 DIAGNOSIS — R1011 Right upper quadrant pain: Secondary | ICD-10-CM

## 2016-05-10 DIAGNOSIS — R1010 Upper abdominal pain, unspecified: Secondary | ICD-10-CM

## 2016-05-10 HISTORY — PX: IR PERC CHOLECYSTOSTOMY: IMG2326

## 2016-05-10 LAB — CBC
HCT: 31.2 % — ABNORMAL LOW (ref 36.0–46.0)
Hemoglobin: 9.6 g/dL — ABNORMAL LOW (ref 12.0–15.0)
MCH: 28.7 pg (ref 26.0–34.0)
MCHC: 30.8 g/dL (ref 30.0–36.0)
MCV: 93.4 fL (ref 78.0–100.0)
Platelets: 255 10*3/uL (ref 150–400)
RBC: 3.34 MIL/uL — ABNORMAL LOW (ref 3.87–5.11)
RDW: 16.2 % — ABNORMAL HIGH (ref 11.5–15.5)
WBC: 15.8 10*3/uL — ABNORMAL HIGH (ref 4.0–10.5)

## 2016-05-10 LAB — BASIC METABOLIC PANEL
Anion gap: 5 (ref 5–15)
BUN: 11 mg/dL (ref 6–20)
CO2: 34 mmol/L — ABNORMAL HIGH (ref 22–32)
Calcium: 9 mg/dL (ref 8.9–10.3)
Chloride: 99 mmol/L — ABNORMAL LOW (ref 101–111)
Creatinine, Ser: 1.01 mg/dL — ABNORMAL HIGH (ref 0.44–1.00)
GFR calc Af Amer: >60 mL/min (ref >60)
GFR calc non Af Amer: >60 mL/min (ref >60)
Glucose, Bld: 98 mg/dL (ref 65–99)
Potassium: 3.6 mmol/L (ref 3.5–5.1)
Sodium: 138 mmol/L (ref 135–145)

## 2016-05-10 LAB — GLUCOSE, CAPILLARY
Glucose-Capillary: 82 mg/dL (ref 65–99)
Glucose-Capillary: 96 mg/dL (ref 65–99)
Glucose-Capillary: 90 mg/dL (ref 65–99)
Glucose-Capillary: 98 mg/dL (ref 65–99)

## 2016-05-10 MED ORDER — MIDAZOLAM HCL 2 MG/2ML IJ SOLN
INTRAMUSCULAR | Status: AC
  Filled 2016-05-10: qty 2

## 2016-05-10 MED ORDER — BENZTROPINE MESYLATE 0.5 MG PO TABS
0.5000 mg | ORAL_TABLET | Freq: Every day | ORAL | Status: DC
  Administered 2016-05-10 – 2016-05-12 (×3): 0.5 mg via ORAL
  Filled 2016-05-10 (×3): qty 1

## 2016-05-10 MED ORDER — IBUPROFEN 100 MG/5ML PO SUSP
400.0000 mg | Freq: Four times a day (QID) | ORAL | Status: DC | PRN
  Filled 2016-05-10: qty 20

## 2016-05-10 MED ORDER — FUROSEMIDE 80 MG PO TABS
80.0000 mg | ORAL_TABLET | Freq: Two times a day (BID) | ORAL | Status: DC
  Administered 2016-05-10 – 2016-05-12 (×4): 80 mg via ORAL
  Filled 2016-05-10 (×4): qty 1

## 2016-05-10 MED ORDER — METRONIDAZOLE 500 MG PO TABS
500.0000 mg | ORAL_TABLET | Freq: Three times a day (TID) | ORAL | Status: DC
  Administered 2016-05-10 – 2016-05-12 (×7): 500 mg via ORAL
  Filled 2016-05-10 (×7): qty 1

## 2016-05-10 MED ORDER — LIDOCAINE HCL (PF) 1 % IJ SOLN
INTRAMUSCULAR | Status: AC | PRN
  Administered 2016-05-10: 10 mL

## 2016-05-10 MED ORDER — ESCITALOPRAM OXALATE 20 MG PO TABS
20.0000 mg | ORAL_TABLET | Freq: Every day | ORAL | Status: DC
  Administered 2016-05-10 – 2016-05-12 (×3): 20 mg via ORAL
  Filled 2016-05-10 (×3): qty 1

## 2016-05-10 MED ORDER — MIRTAZAPINE 15 MG PO TABS
30.0000 mg | ORAL_TABLET | Freq: Every day | ORAL | Status: DC
  Administered 2016-05-10 – 2016-05-11 (×2): 30 mg via ORAL
  Filled 2016-05-10 (×2): qty 2

## 2016-05-10 MED ORDER — ATORVASTATIN CALCIUM 40 MG PO TABS
40.0000 mg | ORAL_TABLET | Freq: Every day | ORAL | Status: DC
  Administered 2016-05-10 – 2016-05-12 (×3): 40 mg via ORAL
  Filled 2016-05-10 (×3): qty 1

## 2016-05-10 MED ORDER — ASPIRIN EC 81 MG PO TBEC
81.0000 mg | DELAYED_RELEASE_TABLET | Freq: Every day | ORAL | Status: DC
  Administered 2016-05-10 – 2016-05-12 (×3): 81 mg via ORAL
  Filled 2016-05-10 (×3): qty 1

## 2016-05-10 MED ORDER — AMLODIPINE BESYLATE 5 MG PO TABS
5.0000 mg | ORAL_TABLET | Freq: Every day | ORAL | Status: DC
  Administered 2016-05-10 – 2016-05-12 (×3): 5 mg via ORAL
  Filled 2016-05-10 (×3): qty 1

## 2016-05-10 MED ORDER — DIVALPROEX SODIUM ER 500 MG PO TB24
500.0000 mg | ORAL_TABLET | Freq: Every day | ORAL | Status: DC
  Administered 2016-05-10 – 2016-05-11 (×2): 500 mg via ORAL
  Filled 2016-05-10 (×2): qty 1

## 2016-05-10 MED ORDER — IOPAMIDOL (ISOVUE-300) INJECTION 61%
INTRAVENOUS | Status: AC
  Administered 2016-05-10: 15 mL
  Filled 2016-05-10: qty 50

## 2016-05-10 MED ORDER — ASPIRIN 81 MG PO TBEC: 81.0000 mg | DELAYED_RELEASE_TABLET | Freq: Every day | ORAL | Status: DC

## 2016-05-10 MED ORDER — LIDOCAINE HCL 1 % IJ SOLN
INTRAMUSCULAR | Status: AC
  Filled 2016-05-10: qty 20

## 2016-05-10 MED ORDER — ZIPRASIDONE HCL 80 MG PO CAPS
80.0000 mg | ORAL_CAPSULE | Freq: Every day | ORAL | Status: DC
  Administered 2016-05-10 – 2016-05-11 (×2): 80 mg via ORAL
  Filled 2016-05-10 (×2): qty 1

## 2016-05-10 MED ORDER — NICOTINE 21 MG/24HR TD PT24
21.0000 mg | MEDICATED_PATCH | Freq: Every day | TRANSDERMAL | Status: DC
Start: 2016-05-10 — End: 2016-05-12
  Administered 2016-05-10 – 2016-05-12 (×3): 21 mg via TRANSDERMAL
  Filled 2016-05-10 (×3): qty 1

## 2016-05-10 MED ORDER — FENTANYL CITRATE (PF) 100 MCG/2ML IJ SOLN
INTRAMUSCULAR | Status: AC | PRN
  Administered 2016-05-10 (×2): 25 ug via INTRAVENOUS

## 2016-05-10 MED ORDER — MIDAZOLAM HCL 2 MG/2ML IJ SOLN
INTRAMUSCULAR | Status: AC | PRN
  Administered 2016-05-10: 1 mg via INTRAVENOUS

## 2016-05-10 MED ORDER — ACETAMINOPHEN 325 MG PO TABS
650.0000 mg | ORAL_TABLET | Freq: Four times a day (QID) | ORAL | Status: DC | PRN
  Administered 2016-05-10 – 2016-05-12 (×5): 650 mg via ORAL
  Filled 2016-05-10 (×5): qty 2

## 2016-05-10 MED ORDER — FENTANYL CITRATE (PF) 100 MCG/2ML IJ SOLN
INTRAMUSCULAR | Status: AC
  Filled 2016-05-10: qty 2

## 2016-05-10 NOTE — Procedures (Signed)
Pre Procedural Dx: Acute Cholecystitis, poor surgical candidate.  Post Procedural Dx: Same  Successful [name] and Fluoro guided placement of 10 Fr cholecystotomy tube.   EBL:  Minimal  Complications: None immediate.  Ronny Bacon, MD Pager #: [phone]

## 2016-05-10 NOTE — Progress Notes (Signed)
[facility] Progress Note     Subjective: CC: RUQ abdominal pain Reports abdominal pain is unchanged compared to yesterday. Denies nausea, vomiting, chills, or night sweats. Perc GB today.  TMAX 100.4  Objective: Vital signs in last 24 hours: Temp:  [98.7 F (37.1 C)-100.4 F (38 C)] 98.7 F (37.1 C) (04/25 0456) Pulse Rate:  [87-97] 87 (04/25 0456) Resp:  [18-22] 18 (04/25 0456) BP: (142-167)/(73-99) 142/73 (04/25 0456) SpO2:  [94 %-100 %] 100 % (04/25 0456) Weight:  [136 kg (299 lb 13.2 oz)] 136 kg (299 lb 13.2 oz) (04/24 2157) Last BM Date: 05/06/16  Intake/Output from previous day: 04/24 0701 - 04/25 0700 In: 650 [P.O.:600; IV Piggyback:50] Out: 1100 [Urine:1100] Intake/Output this shift: No intake/output data recorded.  PE: Gen:  Alert, NAD, pleasant Card:  Regular rate and rhythm, pedal pulses 2+ BL Pulm:  Normal effort, clear to auscultation bilaterally Abd: Soft, obese, tender to light palpation RUQ, bowel sounds present Skin: warm and dry, no rashes  Psych: A&Ox3   Lab Results:   Recent Labs  05/09/16 0419 05/10/16 0644  WBC 11.9* 15.8*  HGB 10.2* 9.6*  HCT 34.6* 31.2*  PLT 257 255   BMET  Recent Labs  05/09/16 0419 05/10/16 0644  NA 141 138  K 3.7 3.6  CL 99* 99*  CO2 31 34*  GLUCOSE 105* 98  BUN 18 11  CREATININE 1.08* 1.01*  CALCIUM 9.4 9.0   PT/INR  Recent Labs  05/09/16 1625  LABPROT 13.2  INR 1.00   CMP     Component Value Date/Time   NA 138 05/10/2016 0644   NA 145 (H) 04/19/2016 0940   K 3.6 05/10/2016 0644   CL 99 (L) 05/10/2016 0644   CO2 34 (H) 05/10/2016 0644   GLUCOSE 98 05/10/2016 0644   BUN 11 05/10/2016 0644   BUN 74 (H) 04/19/2016 0940   CREATININE 1.01 (H) 05/10/2016 0644   CREATININE 1.22 (H) 02/18/2016 1146   CALCIUM 9.0 05/10/2016 0644   PROT 6.1 (L) 05/09/2016 0419   ALBUMIN 3.4 (L) 05/09/2016 0419   AST 22 05/09/2016 0419   ALT 18 05/09/2016 0419   ALKPHOS 101 05/09/2016 0419   BILITOT 0.3 05/09/2016 0419   GFRNONAA >60 05/10/2016 0644   GFRNONAA 50 (L) 02/18/2016 1146   GFRAA >60 05/10/2016 0644   GFRAA 57 (L) 02/18/2016 1146   Lipase     Component Value Date/Time   LIPASE 33 05/09/2016 0419       Studies/Results: Dg Chest 2 View  Result Date: 05/09/2016 CLINICAL DATA:  57 year old female with shortness of breath and right-sided chest pain. EXAM: CHEST  2 VIEW COMPARISON:  Chest radiograph dated 04/10/2016 FINDINGS: Top-normal cardiac size. The lungs are clear. There is no pleural effusion or pneumothorax. No acute osseous pathology. IMPRESSION: 1. No acute cardiopulmonary process. 2. Stable top-normal cardiac size. Electronically Signed   By: Anner Crete M.D.   On: 05/09/2016 05:14   Nm Hepatobiliary Liver Func  Result Date: 05/09/2016 CLINICAL DATA:  Right upper quadrant abdominal pain for 1 day. Leukocytosis. Sonographic findings suspicious for acute cholecystitis. EXAM: NUCLEAR MEDICINE HEPATOBILIARY IMAGING TECHNIQUE: Sequential images of the abdomen were obtained out to 60 minutes following intravenous administration of radiopharmaceutical. RADIOPHARMACEUTICALS:  5.1 mCi Tc-19m  Choletec IV COMPARISON:  Ultrasound 05/09/2016. FINDINGS: Initial images demonstrate homogeneous hepatic activity and spontaneous filling of the biliary system. There is drainage into duodenum with some reflux into the stomach. No opacification of the gallbladder is  identified. Patient refused morphine sulfate administration (typically given to promote contraction of the sphincter of Oddi and increased specificity of non filling of the gallbladder). IMPRESSION: No opacification of the cystic duct or gallbladder, supporting the presence of acute cholecystitis. Specificity decreased by lack of post morphine imaging. Electronically Signed   By: Richardean Sale M.D.   On: 05/09/2016 15:31   US Abdomen Limited Ruq  Result Date: 05/09/2016 CLINICAL DATA:  56 year old female with  right upper quadrant abdominal pain. EXAM: US ABDOMEN LIMITED - RIGHT UPPER QUADRANT COMPARISON:  Abdominal radiograph dated 04/10/2016 FINDINGS: Gallbladder: There multiple stones within the gallbladder. The gallbladder wall is thickened and edematous measuring 7 mm in thickness. There is trace pericholecystic fluid. Tenderness was elicited over the gallbladder area during scanning. Common bile duct: Diameter: 7 mm. Liver: No focal lesion identified. Within normal limits in parenchymal echogenicity. IMPRESSION: Cholelithiasis with findings suspicious for acute cholecystitis. A hepatobiliary scintigraphy may provide better evaluation of the gallbladder if clinically indicated. Electronically Signed   By: Anner Crete M.D.   On: 05/09/2016 06:48    Anti-infectives: Anti-infectives    Start     Dose/Rate Route Frequency Ordered Stop   05/10/16 0800  cefTRIAXone (ROCEPHIN) 2 g in dextrose 5 % 50 mL IVPB    Comments:  Pharmacy may adjust dosing strength / duration / interval for maximal efficacy   2 g 100 mL/hr over 30 Minutes Intravenous Every 24 hours 05/09/16 1052     05/10/16 0800  metroNIDAZOLE (FLAGYL) tablet 500 mg     500 mg Oral Every 8 hours 05/10/16 0755     05/09/16 0900  cefTRIAXone (ROCEPHIN) 2 g in dextrose 5 % 50 mL IVPB  Status:  Discontinued    Comments:  Pharmacy may adjust dosing strength / duration / interval for maximal efficacy   2 g 100 mL/hr over 30 Minutes Intravenous Every 24 hours 05/09/16 0845 05/09/16 1052   05/09/16 0700  cefTRIAXone (ROCEPHIN) 2 g in dextrose 5 % 50 mL IVPB     2 g 100 mL/hr over 30 Minutes Intravenous  Once 05/09/16 0659 05/09/16 0755     Assessment/Plan Acute calculous cholecystitis   - HIDA scan 4/24 no opacification of cystic duct or gallbladder -  Pt is a high risk surgical candidate given her obesity, CHF, and pulmonary disease -  percutaneous cholecystostomy tube placement by IR -  General surgery will follow  FEN - NPO, IVF  ID -  IV Rocephin VTE - SCD's   Diabetes Mellitus HLD HTN CHF COPD on 6 L home O2- followed by Barnett Abu, MD  Asthma OSA on CPAP  Morbid obesity    LOS: 1 day    Ondine Alexanders , [facility] 05/10/2016, 8:25 AM Pager: [phone] Consults: [phone] Mon-Fri 7:00 am-4:30 pm Sat-Sun 7:00 am-11:30 am

## 2016-05-10 NOTE — Telephone Encounter (Signed)
[name] in hospital on medical floor.  She should have [name] level and if she needed any adjustment on her psychiatric medication than her physician should call psychiatry consult.

## 2016-05-10 NOTE — Discharge Summary (Signed)
Tiffany [name]  Patient name: Tiffany [name] Medical record number: [id number] Date of birth: April 15, 1960 Age: 56 y.o. Gender: female Date of Admission: 05/09/2016  Date of Discharge: 05/12/2016 Admitting Physician: Lind Covert, MD  Primary Care Provider: Kathrine Cords, MD Consultants: Surgery, IR  Indication for Hospitalization: Cholecystitis  Discharge Diagnoses/Problem List:  Patient Active Problem List   Diagnosis Date Noted  . Gallstones   . Upper abdominal pain   . Cholecystitis 05/09/2016  . QT prolongation   . OSA and COPD overlap syndrome ([place])   . Generalized weakness   . Arthritis   . Bipolar I disorder ([place])   . Hypokalemia 04/21/2016  . Wheezing 04/12/2016  . (HFpEF) heart failure with preserved ejection fraction ([place])   . Benign essential HTN 03/22/2016  . Rectal bleeding 12/31/2015  . Bipolar affective disorder, mixed, severe, with psychotic behavior ([place]) 11/28/2015  . Cocaine use disorder, severe, dependence ([place]) 11/28/2015  . Hyponatremia 11/25/2015  . AKI (acute kidney injury) ([place]) 11/24/2015  . Blood in sputum   . Chronic diastolic heart failure ([place])   . SOB (shortness of breath)   . Acute on chronic respiratory failure with hypoxia and hypercapnia ([place]) 06/22/2015  . Onychomycosis 01/21/2015  . Nasal congestion 12/17/2014  . Tobacco use disorder 07/22/2014  . COPD (chronic obstructive pulmonary disease) ([place]) 07/08/2014  . Seborrheic keratoses 12/31/2013  . Knee pain, bilateral 01/22/2013  . Seizure ([place]) 01/04/2013  . Health care maintenance 11/25/2012  . Folliculitis 65/68/1275  . History of kidney stones 06/18/2012  . Chronic pain syndrome 06/18/2012  . Muscle spasm 06/18/2012  . Elevated troponin 04/28/2012  . Allergic reaction 04/07/2012  . Itching 09/06/2011  . HSV infection 08/30/2011  . Dyslipidemia 04/24/2011  . Anemia 04/24/2011  . Leukocytosis 04/24/2011  . Diabetic neuropathy  ([place]) 04/24/2011  . Obstructive sleep apnea 10/18/2010  . Asthma 10/18/2010  . Morbid obesity ([place]) 10/18/2010  . Type 2 diabetes mellitus ([place]) 10/18/2010     Disposition: Home  Discharge Condition: Stable/Improved  Discharge Exam:  Temp:  [98.7 F (37.1 C)-99.5 F (37.5 C)] 98.7 F (37.1 C) (04/27 0910) Pulse Rate:  [77-86] 85 (04/27 0910) Resp:  [17-20] 19 (04/27 0910) BP: (110-139)/(68-80) 131/77 (04/27 0910) SpO2:  [97 %-99 %] 97 % (04/27 0920) Weight:  [287 lb (130.2 kg)] 287 lb (130.2 kg) (04/26 2314) Physical Exam:  General: NAD, rests in bed, obese female Cardiovascular: RRR, no m/r/g Respiratory: CTA bil, distant breath sounds with body habitus, no W/R/R Abdomen: soft, nondistended, +mild tender to palpation in RUQ, no rebound tenderness or guarding, drain with serosanguinous fluid, insertion site c/d/i Extremities: no LE edema, warm and well-perfused  Brief Hospital Course:  Tiffany Carraway McElrathis a 56 y.o.femalepresenting with right upper quadrant abdominal pain and admitted for cholecystitis. PMH is significant for CKD, HFpEF (Echo in 03/2016 with EF60-65% and grade 2 diastolic dysfunction, moderate LV hypertrophy.), COPD, diabetes, OSA, Bipolar, hyperlipidemia.   Cholecystitis:  Patient found to have HIDA with filling defect consistent with acute cholecystitis. Antibiotics were given. General Surgery followed along during her hospital stay, however patient was considered high risk for surgery.  IR was consulted for percutaneous cholecystostomy placement. Patient tolerated this procedure well.  Drain was left in place at discharge.  Plan to have patient follow up with IR in 6 weeks for drain check, may have to be left in long-term. She was treated with metronidazole 500 mg q8H, (4/25 -4/26), Ceftriaxone (4/24>>4/26) and transitioned to  ciprofloxacin to complete a 10 day course per surgery recs (last dose 5/3). Pain improved and she was ambulating and tolerating a normal  diet at discharge.  Bipolar DIsorder Patient was initially kept NPO due to cholecystitis and possible surgery.  Her home medications were held while she was NPO including her psych medications Geodon, Remeron, and Lexapro, [name].  Patient was restarted on these medications once she was tolerating PO.  [name] level was checked at her home psychiatrist's recommendation and was low, as would be expected with holding the medication.  Consider rechecking depakote level at hospital follow up after depakote has had chance to reach steady state.  The rest of the patient's chronic medical issues were stable throughout her hospital stay.  She was managed on home meds.   Issues for Follow Up:  1. Patient was discharged with  ciprofloxacin to complete a 10 day course of antibiotics (last dose 5/3) 2. Patient to call and schedule IR follow up 6 weeks after discharge for drain check 3. Can consider rechecking depakote level if concern for subtherapeutic, it was low during hospital stay in setting of holding depakote for NPO, was restarted prior to discharge.  Significant Procedures: IR drain placement  Significant Labs and Imaging:   Recent Labs Lab 05/10/16 0644 05/11/16 0525 05/12/16 0534  WBC 15.8* 11.3* 11.1*  HGB 9.6* 10.3* 9.9*  HCT 31.2* 33.5* 32.7*  PLT 255 281 336    Recent Labs Lab 05/09/16 0419 05/10/16 0644 05/11/16 0525 05/12/16 0534  NA 141 138 139 142  K 3.7 3.6 3.3* 3.7  CL 99* 99* 97* 103  CO2 31 34* 32 32  GLUCOSE 105* 98 89 89  BUN '18 11 11 14  ' CREATININE 1.08* 1.01* 1.10* 1.24*  CALCIUM 9.4 9.0 9.5 8.5*  ALKPHOS 101  --   --   --   AST 22  --   --   --   ALT 18  --   --   --   ALBUMIN 3.4*  --   --   --     Results/Tests Pending at Time of Discharge:  None  Discharge Medications:  Allergies as of 05/12/2016      Reactions   Hydroxyzine Shortness Of Breath   -throat closed up   Latuda [lurasidone Hcl] Anaphylaxis   Codeine Nausea And Vomiting   Sulfa  Antibiotics Itching      Medication List    STOP taking these medications   potassium chloride SA 20 MEQ tablet Commonly known as:  K-DUR,KLOR-CON     TAKE these medications   ACCU-CHEK AVIVA PLUS w/Device Kit CHECK BLOOD SUGAR THREE TIMES DAILY   acetaminophen 650 MG CR tablet Commonly known as:  TYLENOL 8 HOUR Take 1 tablet (650 mg total) by mouth every 8 (eight) hours as needed for pain.   albuterol (2.5 MG/3ML) 0.083% nebulizer solution Commonly known as:  PROVENTIL Take 3 mLs (2.5 mg total) by nebulization every 6 (six) hours as needed for wheezing or shortness of breath. What changed:  Another medication with the same name was changed. Make sure you understand how and when to take each.   VENTOLIN HFA 108 (90 Base) MCG/ACT inhaler Generic drug:  albuterol INHALE 2 PUFFS EVERY 6 HOURS AS NEEDED What changed:  See the new instructions.   amLODipine 5 MG tablet Commonly known as:  NORVASC Take 1 tablet (5 mg total) by mouth daily.   aspirin 81 MG EC tablet Take 1 tablet (81 mg total)  by mouth daily. Swallow whole.   atorvastatin 40 MG tablet Commonly known as:  LIPITOR Take 1 tablet (40 mg total) by mouth daily.   B-D SINGLE USE SWABS REGULAR Pads CHECK CAPILLARY BLOOD GLUCOSE ONE TIME DAILY   benztropine 0.5 MG tablet Commonly known as:  COGENTIN Take 1 tablet (0.5 mg total) by mouth daily.   BREO ELLIPTA 100-25 MCG/INH Aepb Generic drug:  fluticasone furoate-vilanterol INHALE 1 PUFF EVERY DAY   ciprofloxacin 500 MG tablet Commonly known as:  CIPRO Take 1 tablet (500 mg total) by mouth 2 (two) times daily.   divalproex 500 MG 24 hr tablet Commonly known as:  [name] ER Take 1 tablet (500 mg total) by mouth daily. What changed:  when to take this   EQL NICOTINE 21 mg/24hr patch Generic drug:  nicotine Place 21 mg onto the skin daily.   escitalopram 20 MG tablet Commonly known as:  LEXAPRO Take 1 tablet (20 mg total) by mouth daily.   furosemide  40 MG tablet Commonly known as:  LASIX Take 2 tablets (80 mg total) by mouth 2 (two) times daily.   gabapentin 600 MG tablet Commonly known as:  NEURONTIN Take 1 tablet (600 mg total) by mouth 3 (three) times daily.   glucose blood test strip Use to check blood sugar 3 times daily.   metFORMIN 500 MG 24 hr tablet Commonly known as:  GLUCOPHAGE-XR TAKE 1 TABLET TWICE DAILY   mirtazapine 30 MG tablet Commonly known as:  REMERON Take 1 tablet (30 mg total) by mouth at bedtime.   montelukast 10 MG tablet Commonly known as:  SINGULAIR TAKE 1 TABLET AT BEDTIME   multivitamin with minerals tablet Take 1 tablet by mouth daily. Reported on 07/28/2015   naproxen 500 MG tablet Commonly known as:  NAPROSYN Take 500 mg by mouth daily.   omeprazole 40 MG capsule Commonly known as:  PRILOSEC Take 1 capsule (40 mg total) by mouth daily. What changed:  when to take this   OXYGEN Inhale 6 L into the lungs daily.   PRESCRIPTION MEDICATION Inhale into the lungs at bedtime. CPAP   tiotropium 18 MCG inhalation capsule Commonly known as:  SPIRIVA HANDIHALER Place 1 capsule (18 mcg total) into inhaler and inhale daily.   ziprasidone 80 MG capsule Commonly known as:  GEODON Take 1 capsule (80 mg total) by mouth at bedtime.       Discharge Instructions: Please refer to Patient Instructions section of EMR for full details.  Patient was counseled important signs and symptoms that should prompt return to medical care, changes in medications, dietary instructions, activity restrictions, and follow up appointments.   Follow-Up Appointments: Follow-up Information    Sandi Mariscal, MD Follow up.   Specialty:  Interventional Radiology Why:  follow up in IR outpatient drain clinic 5-6 weeks; pt will hear from scheduler for time and date; call [phone] if questions or concerns Contact information: [address] [place] [address] [phone]        CORNETT,THOMAS A.,  MD. Schedule an appointment as soon as possible for a visit in 4 week(s).   Specialty:  General Surgery Why:  for follow up from your recent gallbladder drain and hospitalization. Contact information: [address] [place] [address] [phone]        Kathrine Cords, MD Follow up on 05/18/2016.   Specialty:  Family Medicine Why:  Please go to your hospital follow up appt at 4PM Contact information:  [address] [place] [address] [phone]           Everrett Coombe, MD 05/12/2016, 4:37 PM PGY-1, [place]

## 2016-05-10 NOTE — Progress Notes (Signed)
Family Medicine Teaching Service Daily Progress Note Intern Pager: [phone]  Patient name: Tiffany [name] Medical record number: [id number] Date of birth: 25-May-1960 Age: 56 y.o. Gender: female  Primary Care Provider: Kathrine Cords, MD Consultants: Gen surg, IR Code Status: Full  Pt Overview and Major Events to Date:  4/24 Admit to FPTS  Assessment and Plan: Tiffany [name] is a 56 y.o. female presenting with right upper quadrant abdominal pain and admitted for cholecystitis. PMH is significant for CKD, HFpEF (Echo in 03/2016 with EF60-65% and grade 2 diastolic dysfunction, moderate LV hypertrophy.), COPD, diabetes, OSA, Bipolar, hyperlipidemia.   Cholecystitis: HIDA with filling defect consistent with acute cholecystitis. Febrile overnight to 100.31F, leukocytosis 15.8 from 11.9 on admission. General Surgery consulted by ED, pt high risk for surgery, IR planning for percutaneous cholecystostomy.  - NPO -tylenol, ibuprofen PRN pain - start metronidazole 500 mg q8H for anaerobic coverage given fevers and worsening leukocytosis (4/25 - ) - Ceftriaxone (4/24>>) - Holding most home medications due to NPO status, restart when taking PO - Consider physical therapy/OT consult following surgical evaluation and intervention  Anemia: Hemoglobin 9.6 from 10.2 at admission. MCV normal at 93.8. Most likely secondary to CKD. - Monitor on CBC  CKD, stable: Baseline ~1.3-1.4. Creatinine improved at 1.01 from 1.08 at admission. - Monitor on BMP  HFpEF, stable: Currently euvolemic. On home lasix 80mg  twice daily. BNP 15.4 at admission. - Continue to monitor.  - Holding home lasix. Restart IV lasix if fluid status worsens.   COPD: On 6L [place] O2 at baseline. Followed by Pulmonology. - Continue home medications: Albuterol, Breo, Spiriva  DM2: Last A1C 5.8 in 03/2016. Home medications include Metformin - Holding home Metformin - NPO. Holding sliding scale for now, but consider once taking PO -  Monitor blood sugars - q4H CBG while NPO  OSA: CPAP at night.  Bipolar: Home medications include Geodon, Remeron, and Lexapro - Holding home medications. Restart once taking PO  HLD: Home medications include Lipitor  - Holding home Lipitor. Restart once taking PO  FEN/GI: NPO Prophylaxis: SCD  Disposition: planning for perc drain by IR today  Subjective:  Patient complains of abdominal pain this AM. Also notes some light-headedness, she endorses that she has been getting out of bed and ambulating normally. About to get her inhalers when I'm in the room. Satting well, nontoxic appearing, speaking in full sentances.  Objective: Temp:  [98.7 F (37.1 C)-100.4 F (38 C)] 98.7 F (37.1 C) (04/25 0456) Pulse Rate:  [86-96] 86 (04/25 0914) Resp:  [18-27] 27 (04/25 0914) BP: (142-167)/(73-96) 166/96 (04/25 0914) SpO2:  [88 %-100 %] 88 % (04/25 0914) Weight:  [299 lb 13.2 oz (136 kg)] 299 lb 13.2 oz (136 kg) (04/24 2157) Physical Exam: General: NAD, rests in bed, obese female Cardiovascular: RRR, no m/r/g Respiratory: CTA bil, distant breath sounds with body habitus Abdomen: soft, nondistended, +tender to palpation in RUQ, no rebound tenderness or guarding Extremities: no LE edema, warm and well-perfused  Laboratory:  Lipase 33  istat trop negative   Recent Labs Lab 05/09/16 0419 05/10/16 0644  WBC 11.9* 15.8*  HGB 10.2* 9.6*  HCT 34.6* 31.2*  PLT 257 255    Recent Labs Lab 05/09/16 0419 05/10/16 0644  NA 141 138  K 3.7 3.6  CL 99* 99*  CO2 31 34*  BUN 18 11  CREATININE 1.08* 1.01*  CALCIUM 9.4 9.0  PROT 6.1*  --   BILITOT 0.3  --   ALKPHOS 101  --  ALT 18  --   AST 22  --   GLUCOSE 105* 98      Imaging/Diagnostic Tests: HIDA scan shows filling defect consistent with acute cholecystitis  Everrett Coombe, MD 05/10/2016, 9:19 AM PGY-1, [place] Intern pager: [phone], text pages welcome

## 2016-05-10 NOTE — Telephone Encounter (Signed)
Patient called and said that she thinks she needs an increase in her [name]. Patient states she has been out of control with her anger, and she is unable to control her outbursts. Please review and advise, thank you.

## 2016-05-10 NOTE — Consult Note (Signed)
   [facility] Inpatient Consult   05/10/2016  Tiffany [name] [birth year] [id number]    [facility] Care Management follow up. Chart reviewed. Patient post procedure today. Made inpatient RNCM aware that [facility] is following. Will continue to follow.   Marthenia Rolling, MSN-Ed, RN,BSN [facility] Liaison [phone]

## 2016-05-11 ENCOUNTER — Other Ambulatory Visit: Payer: Self-pay | Admitting: Family Medicine

## 2016-05-11 ENCOUNTER — Telehealth: Payer: Self-pay | Admitting: Family Medicine

## 2016-05-11 ENCOUNTER — Other Ambulatory Visit: Payer: Self-pay | Admitting: *Deleted

## 2016-05-11 ENCOUNTER — Other Ambulatory Visit (HOSPITAL_COMMUNITY): Payer: Self-pay | Admitting: Psychiatry

## 2016-05-11 ENCOUNTER — Ambulatory Visit: Payer: Self-pay | Admitting: Family Medicine

## 2016-05-11 LAB — BASIC METABOLIC PANEL
Anion gap: 10 (ref 5–15)
BUN: 11 mg/dL (ref 6–20)
CO2: 32 mmol/L (ref 22–32)
Calcium: 9.5 mg/dL (ref 8.9–10.3)
Chloride: 97 mmol/L — ABNORMAL LOW (ref 101–111)
Creatinine, Ser: 1.1 mg/dL — ABNORMAL HIGH (ref 0.44–1.00)
GFR calc Af Amer: >60 mL/min (ref >60)
GFR calc non Af Amer: 55 mL/min — ABNORMAL LOW (ref >60)
Glucose, Bld: 89 mg/dL (ref 65–99)
Potassium: 3.3 mmol/L — ABNORMAL LOW (ref 3.5–5.1)
Sodium: 139 mmol/L (ref 135–145)

## 2016-05-11 LAB — CBC
HCT: 33.5 % — ABNORMAL LOW (ref 36.0–46.0)
Hemoglobin: 10.3 g/dL — ABNORMAL LOW (ref 12.0–15.0)
MCH: 28.2 pg (ref 26.0–34.0)
MCHC: 30.7 g/dL (ref 30.0–36.0)
MCV: 91.8 fL (ref 78.0–100.0)
Platelets: 281 10*3/uL (ref 150–400)
RBC: 3.65 MIL/uL — ABNORMAL LOW (ref 3.87–5.11)
RDW: 16.4 % — ABNORMAL HIGH (ref 11.5–15.5)
WBC: 11.3 10*3/uL — ABNORMAL HIGH (ref 4.0–10.5)

## 2016-05-11 LAB — GLUCOSE, CAPILLARY
Glucose-Capillary: 93 mg/dL (ref 65–99)
Glucose-Capillary: 91 mg/dL (ref 65–99)
Glucose-Capillary: 91 mg/dL (ref 65–99)
Glucose-Capillary: 91 mg/dL (ref 65–99)
Glucose-Capillary: 135 mg/dL — ABNORMAL HIGH (ref 65–99)
Glucose-Capillary: 90 mg/dL (ref 65–99)

## 2016-05-11 LAB — VALPROIC ACID LEVEL: Valproic Acid Lvl: 18 ug/mL — ABNORMAL LOW (ref 50.0–100.0)

## 2016-05-11 MED ORDER — POLYETHYLENE GLYCOL 3350 17 G PO PACK
17.0000 g | PACK | Freq: Every day | ORAL | Status: DC | PRN
  Administered 2016-05-11: 17 g via ORAL
  Filled 2016-05-11: qty 1

## 2016-05-11 MED ORDER — POTASSIUM CHLORIDE CRYS ER 20 MEQ PO TBCR
40.0000 meq | EXTENDED_RELEASE_TABLET | Freq: Once | ORAL | Status: AC
  Administered 2016-05-11: 40 meq via ORAL
  Filled 2016-05-11: qty 2

## 2016-05-11 MED ORDER — GABAPENTIN 600 MG PO TABS
600.0000 mg | ORAL_TABLET | Freq: Three times a day (TID) | ORAL | Status: DC
  Administered 2016-05-11 – 2016-05-12 (×4): 600 mg via ORAL
  Filled 2016-05-11 (×4): qty 1

## 2016-05-11 MED ORDER — ENOXAPARIN SODIUM 80 MG/0.8ML ~~LOC~~ SOLN
0.5000 mg/kg | SUBCUTANEOUS | Status: DC
  Administered 2016-05-11 – 2016-05-12 (×2): 65 mg via SUBCUTANEOUS
  Filled 2016-05-11 (×2): qty 0.8

## 2016-05-11 NOTE — Progress Notes (Signed)
Visited pts room to place on Cpap pt states she does not wear it

## 2016-05-11 NOTE — Progress Notes (Signed)
Referring Physician(s): Dr Christianne Borrow Dr Shann Medal  Supervising Physician: Arne [place]  Patient Status:  [facility] - In-pt  Chief Complaint:  Perc cholecystostomy drain placed 4/25  Subjective:  Less pain in abd today Resting well Drain has collected 260 cc so far; 10 cc blood tinged fluid in bag now  Allergies: Hydroxyzine; Latuda [lurasidone hcl]; Codeine; and Sulfa antibiotics  Medications: Prior to Admission medications   Medication Sig Start Date End Date Taking? Authorizing Provider  acetaminophen (TYLENOL 8 HOUR) 650 MG CR tablet Take 1 tablet (650 mg total) by mouth every 8 (eight) hours as needed for pain. 04/27/16  Yes Archie Patten, MD  albuterol (PROVENTIL) (2.5 MG/3ML) 0.083% nebulizer solution Take 3 mLs (2.5 mg total) by nebulization every 6 (six) hours as needed for wheezing or shortness of breath. 02/18/16  Yes [place] Rum[place], DO  amLODipine (NORVASC) 5 MG tablet Take 1 tablet (5 mg total) by mouth daily. 05/05/16  Yes Archie Patten, MD  aspirin 81 MG EC tablet Take 1 tablet (81 mg total) by mouth daily. Swallow whole. 12/05/15  Yes Jolanta B Pucilowska, MD  atorvastatin (LIPITOR) 40 MG tablet Take 1 tablet (40 mg total) by mouth daily. 12/05/15  Yes Jolanta B Pucilowska, MD  benztropine (COGENTIN) 0.5 MG tablet Take 1 tablet (0.5 mg total) by mouth daily. 03/31/16 03/31/17 Yes Kathlee Nations, MD  BREO ELLIPTA 100-25 MCG/INH AEPB INHALE 1 PUFF EVERY DAY 02/15/16  Yes Sela Hua, MD  divalproex ([name] ER) 500 MG 24 hr tablet Take 1 tablet (500 mg total) by mouth daily. Patient taking differently: Take 500 mg by mouth at bedtime.  03/31/16 03/31/17 Yes Kathlee Nations, MD  escitalopram (LEXAPRO) 20 MG tablet Take 1 tablet (20 mg total) by mouth daily. 03/31/16  Yes Kathlee Nations, MD  furosemide (LASIX) 40 MG tablet Take 2 tablets (80 mg total) by mouth 2 (two) times daily. 04/15/16  Yes Sela Hua, MD  gabapentin (NEURONTIN) 600 MG tablet Take 1 tablet (600  mg total) by mouth 3 (three) times daily. 12/05/15  Yes Jolanta B Pucilowska, MD  metFORMIN (GLUCOPHAGE-XR) 500 MG 24 hr tablet TAKE 1 TABLET TWICE DAILY 03/28/16  Yes Archie Patten, MD  mirtazapine (REMERON) 30 MG tablet Take 1 tablet (30 mg total) by mouth at bedtime. 03/31/16  Yes Kathlee Nations, MD  montelukast (SINGULAIR) 10 MG tablet TAKE 1 TABLET AT BEDTIME 04/24/16  Yes Archie Patten, MD  Multiple Vitamins-Minerals (MULTIVITAMIN WITH MINERALS) tablet Take 1 tablet by mouth daily. Reported on 07/28/2015 12/05/15  Yes Jolanta B Pucilowska, MD  naproxen (NAPROSYN) 500 MG tablet Take 500 mg by mouth daily.   Yes Historical Provider, MD  nicotine (EQL NICOTINE) 21 mg/24hr patch Place 21 mg onto the skin daily.   Yes Historical Provider, MD  omeprazole (PRILOSEC) 40 MG capsule Take 1 capsule (40 mg total) by mouth daily. Patient taking differently: Take 40 mg by mouth 2 (two) times daily.  12/05/15  Yes Jolanta B Pucilowska, MD  potassium chloride SA (K-DUR,KLOR-CON) 20 MEQ tablet Take 2 tablets (40 mEq total) by mouth 2 (two) times daily. 04/24/16 05/09/16 Yes Everrett Coombe, MD  tiotropium (SPIRIVA HANDIHALER) 18 MCG inhalation capsule Place 1 capsule (18 mcg total) into inhaler and inhale daily. 12/05/15  Yes Jolanta B Pucilowska, MD  ziprasidone (GEODON) 80 MG capsule Take 1 capsule (80 mg total) by mouth at bedtime. 03/31/16  Yes Kathlee Nations, MD  Alcohol Swabs (B-D SINGLE USE SWABS REGULAR) PADS CHECK CAPILLARY BLOOD GLUCOSE ONE TIME DAILY 02/03/16   Archie Patten, MD  Blood Glucose Monitoring Suppl (ACCU-CHEK AVIVA PLUS) w/Device KIT CHECK BLOOD SUGAR THREE TIMES DAILY 12/02/15   Archie Patten, MD  glucose blood test strip Use to check blood sugar 3 times daily. 12/05/15   Clovis Fredrickson, MD  OXYGEN Inhale 6 L into the lungs daily.    Historical Provider, MD  PRESCRIPTION MEDICATION Inhale into the lungs at bedtime. CPAP    Historical Provider, MD  VENTOLIN HFA 108 (90 Base) MCG/ACT  inhaler INHALE 2 PUFFS EVERY 6 HOURS AS NEEDED 05/10/16   Archie Patten, MD     Vital Signs: BP (!) 141/79 (BP Location: Left Arm)   Pulse 93   Temp 99.5 F (37.5 C) (Oral)   Resp 19   Ht 5' 4" (1.626 m)   Wt 293 lb 3.4 oz (133 kg)   SpO2 (!) 86%   BMI 50.33 kg/m   Physical Exam  Constitutional: [name] oriented to person, place, and time.  Abdominal: Soft. Bowel sounds are normal. There is no tenderness.  Musculoskeletal: Normal range of motion.  Neurological: [name] alert and oriented to person, place, and time.  Skin: Skin is warm and dry.  Site is clean and dry NT no bleeding No hematoma OP is blood tinged fluid 260 cc yesterday; 10- cc in bag afeb   Psychiatric: She has a normal mood and affect. Her behavior is normal.  Nursing note and vitals reviewed.   Imaging: Dg Chest 2 View  Result Date: 05/09/2016 CLINICAL DATA:  56 year old female with shortness of breath and right-sided chest pain. EXAM: CHEST  2 VIEW COMPARISON:  Chest radiograph dated 04/10/2016 FINDINGS: Top-normal cardiac size. The lungs are clear. There is no pleural effusion or pneumothorax. No acute osseous pathology. IMPRESSION: 1. No acute cardiopulmonary process. 2. Stable top-normal cardiac size. Electronically Signed   By: Anner Crete M.D.   On: 05/09/2016 05:14   Nm Hepatobiliary Liver Func  Result Date: 05/09/2016 CLINICAL DATA:  Right upper quadrant abdominal pain for 1 day. Leukocytosis. Sonographic findings suspicious for acute cholecystitis. EXAM: NUCLEAR MEDICINE HEPATOBILIARY IMAGING TECHNIQUE: Sequential images of the abdomen were obtained out to 60 minutes following intravenous administration of radiopharmaceutical. RADIOPHARMACEUTICALS:  5.1 mCi Tc-75m Choletec IV COMPARISON:  Ultrasound 05/09/2016. FINDINGS: Initial images demonstrate homogeneous hepatic activity and spontaneous filling of the biliary system. There is drainage into duodenum with some reflux into the stomach. No  opacification of the gallbladder is identified. Patient refused morphine sulfate administration (typically given to promote contraction of the sphincter of Oddi and increased specificity of non filling of the gallbladder). IMPRESSION: No opacification of the cystic duct or gallbladder, supporting the presence of acute cholecystitis. Specificity decreased by lack of post morphine imaging. Electronically Signed   By: WRichardean SaleM.D.   On: 05/09/2016 15:31   Ir Perc Cholecystostomy  Result Date: 05/10/2016 INDICATION: Acute cholecystitis.  Poor operative candidate. EXAM: ULTRASOUND AND FLUOROSCOPIC-GUIDED CHOLECYSTOSTOMY TUBE PLACEMENT COMPARISON:  Right upper quadrant abdominal ultrasound - 05/09/2016; nuclear medicine HIDA scan -05/09/2016 MEDICATIONS: The patient is currently admitted to the hospital and on intravenous antibiotics. Antibiotics were administered within an appropriate time frame prior to skin puncture. ANESTHESIA/SEDATION: Moderate (conscious) sedation was employed during this procedure. A total of Versed 1 mg and Fentanyl 50 mcg was administered intravenously. Moderate Sedation Time: 12 minutes. The patient's level of consciousness and vital  signs were monitored continuously by radiology nursing throughout the procedure under my direct supervision. CONTRAST:  15 cc Isovue-300 - administered into the gallbladder fossa. FLUOROSCOPY TIME:  1 minutes 6 seconds (027 mGy) COMPLICATIONS: None immediate. PROCEDURE: Informed written consent was obtained from the patient after a discussion of the risks, benefits and alternatives to treatment. Questions regarding the procedure were encouraged and answered. A timeout was performed prior to the initiation of the procedure. The right upper abdominal quadrant was prepped and draped in the usual sterile fashion, and a sterile drape was applied covering the operative field. Maximum barrier sterile technique with sterile gowns and gloves were used for the  procedure. A timeout was performed prior to the initiation of the procedure. Local anesthesia was provided with 1% lidocaine with epinephrine. Ultrasound scanning of the right upper quadrant demonstrates a mildly dilated gallbladder with minimal amount of associated pericholecystic fluid. Of note, the patient reported pain with ultrasound imaging over the gallbladder. Utilizing a transhepatic approach, a 22 gauge needle was advanced into the gallbladder under direct ultrasound guidance. An ultrasound image was saved for documentation purposes. Appropriate intraluminal puncture was confirmed with the efflux of bile and advancement of an 0.018 wire into the gallbladder lumen. The needle was exchanged for an [place] set. A small amount of contrast was injected to confirm appropriate intraluminal positioning. Over a Benson wire, a 35.2-French Cook cholecystomy tube was advanced into the gallbladder fossa, coiled and locked. Bile was aspirated and a small amount of contrast was injected as several post procedural spot radiographic images were obtained in various obliquities. The catheter was secured to the skin with suture, connected to a drainage bag and a dressing was placed. The patient tolerated the procedure well without immediate post procedural complication. IMPRESSION: Successful ultrasound and fluoroscopic guided placement of a 10.2 French cholecystostomy tube. Electronically Signed   By: Sandi Mariscal M.D.   On: 05/10/2016 10:07   US Abdomen Limited Ruq  Result Date: 05/09/2016 CLINICAL DATA:  56 year old female with right upper quadrant abdominal pain. EXAM: US ABDOMEN LIMITED - RIGHT UPPER QUADRANT COMPARISON:  Abdominal radiograph dated 04/10/2016 FINDINGS: Gallbladder: There multiple stones within the gallbladder. The gallbladder wall is thickened and edematous measuring 7 mm in thickness. There is trace pericholecystic fluid. Tenderness was elicited over the gallbladder area during scanning. Common  bile duct: Diameter: 7 mm. Liver: No focal lesion identified. Within normal limits in parenchymal echogenicity. IMPRESSION: Cholelithiasis with findings suspicious for acute cholecystitis. A hepatobiliary scintigraphy may provide better evaluation of the gallbladder if clinically indicated. Electronically Signed   By: Anner Crete M.D.   On: 05/09/2016 06:48    Labs:  CBC:  Recent Labs  04/24/16 0530 05/09/16 0419 05/10/16 0644 05/11/16 0525  WBC 10.4 11.9* 15.8* 11.3*  HGB 11.1* 10.2* 9.6* 10.3*  HCT 36.1 34.6* 31.2* 33.5*  PLT 323 257 255 281    COAGS:  Recent Labs  05/09/16 1625  INR 1.00    BMP:  Recent Labs  04/24/16 0530 05/09/16 0419 05/10/16 0644 05/11/16 0525  NA 142 141 138 139  K 3.7 3.7 3.6 3.3*  CL 100* 99* 99* 97*  CO2 32 31 34* 32  GLUCOSE 88 105* 98 89  BUN 22* _0 CALCIUM 9.4 9.4 9.0 9.5  CREATININE 1.08* 1.08* 1.01* 1.10*  GFRNONAA 56* 56* >60 55*  GFRAA >60 >60 >60 >60    LIVER FUNCTION TESTS:  Recent Labs  07/09/15 1325 11/24/15 2116 04/10/16 1650 04/22/16 1017  05/09/16 0419  BILITOT 1.5* 0.9 0.4  --  0.3  AST _0 --  22  ALT 15 12* 18  --  18  ALKPHOS 100 122 95  --  101  PROT 7.3 6.5 6.1*  --  6.1*  ALBUMIN 3.9 3.4* 3.3* 3.2* 3.4*    Assessment and Plan:  Percutaneous cholecystostomy drain intact Will follow few days Plan for chole drain check in IR drain clinic 5 weeks She will hear from scheduler for time and date Orders in for same Will need flush daily with 5 cc sterile saline and collect/record output daily  Electronically Signed: Reda Gettis A 05/11/2016, 7:25 AM   I spent a total of 15 Minutes at the the patient's bedside AND on the patient's hospital floor or unit, greater than 50% of which was counseling/coordinating care for perc chole drain placement

## 2016-05-11 NOTE — Progress Notes (Signed)
CPAP placed on pt for the night.  Tolerating well.

## 2016-05-11 NOTE — Progress Notes (Signed)
Pharmacy - Lovenox for VTE prophylaxis  CC: Cholecystitis s/p percutaneous drain by IR on 4/25.   Height: 64" Weight: 133 kg Body mass index is 50.33 kg/m.  SCr 1.1, est CrCl ~76 ml/min No bleeding noted, Hgb 10.3 and stable, platelets are normal at 281  Plan: Lovenox 0.5 mg/kg SQ q24h for BMI and renal function CBC q72h while on Lovenox Pharmacy signing off but will peripherally monitor CBC and dosing   Tiffany [name], PharmD, BCPS Clinical Pharmacist Phone for today - [place] - [phone] 05/11/2016 10:41 AM

## 2016-05-11 NOTE — Progress Notes (Signed)
Subjective: CC ABDOMINAL PAIN   Pain less in abdomen since drain placed   Does hurt some with deep breaths in her RUQ   Wants to eat   Objective: Vital signs in last 24 hours: Temp:  [98.3 F (36.8 C)-99.5 F (37.5 C)] 99.5 F (37.5 C) (04/26 0407) Pulse Rate:  [82-93] 93 (04/26 0407) Resp:  [18-27] 19 (04/26 0407) BP: (126-173)/(77-97) 141/79 (04/26 0407) SpO2:  [86 %-98 %] 98 % (04/26 0729) Weight:  [133 kg (293 lb 3.4 oz)] 133 kg (293 lb 3.4 oz) (04/25 1951) Last BM Date: 05/05/16  Intake/Output from previous day: 04/25 0701 - 04/26 0700 In: 665 [P.O.:600; IV Piggyback:50] Out: 3110 [Urine:2850; Drains:260] Intake/Output this shift: No intake/output data recorded.  Morbidly  obese  SOB laying in bed   CV  RRR  Pulm  Wheezes   Abdomen  Drain in place   Less tender RUQ   Lab Results:   Recent Labs  05/10/16 0644 05/11/16 0525  WBC 15.8* 11.3*  HGB 9.6* 10.3*  HCT 31.2* 33.5*  PLT 255 281   BMET  Recent Labs  05/10/16 0644 05/11/16 0525  NA 138 139  K 3.6 3.3*  CL 99* 97*  CO2 34* 32  GLUCOSE 98 89  BUN 11 11  CREATININE 1.01* 1.10*  CALCIUM 9.0 9.5   PT/INR  Recent Labs  05/09/16 1625  LABPROT 13.2  INR 1.00   ABG No results for input(s): PHART, HCO3 in the last 72 hours.  Invalid input(s): PCO2, PO2  Studies/Results: Nm Hepatobiliary Liver Func  Result Date: 05/09/2016 CLINICAL DATA:  Right upper quadrant abdominal pain for 1 day. Leukocytosis. Sonographic findings suspicious for acute cholecystitis. EXAM: NUCLEAR MEDICINE HEPATOBILIARY IMAGING TECHNIQUE: Sequential images of the abdomen were obtained out to 60 minutes following intravenous administration of radiopharmaceutical. RADIOPHARMACEUTICALS:  5.1 mCi Tc-17m  Choletec IV COMPARISON:  Ultrasound 05/09/2016. FINDINGS: Initial images demonstrate homogeneous hepatic activity and spontaneous filling of the biliary system. There is drainage into duodenum with some reflux into the  stomach. No opacification of the gallbladder is identified. Patient refused morphine sulfate administration (typically given to promote contraction of the sphincter of Oddi and increased specificity of non filling of the gallbladder). IMPRESSION: No opacification of the cystic duct or gallbladder, supporting the presence of acute cholecystitis. Specificity decreased by lack of post morphine imaging. Electronically Signed   By: Richardean Sale M.D.   On: 05/09/2016 15:31   Ir Perc Cholecystostomy  Result Date: 05/10/2016 INDICATION: Acute cholecystitis.  Poor operative candidate. EXAM: ULTRASOUND AND FLUOROSCOPIC-GUIDED CHOLECYSTOSTOMY TUBE PLACEMENT COMPARISON:  Right upper quadrant abdominal ultrasound - 05/09/2016; nuclear medicine HIDA scan -05/09/2016 MEDICATIONS: The patient is currently admitted to the hospital and on intravenous antibiotics. Antibiotics were administered within an appropriate time frame prior to skin puncture. ANESTHESIA/SEDATION: Moderate (conscious) sedation was employed during this procedure. A total of Versed 1 mg and Fentanyl 50 mcg was administered intravenously. Moderate Sedation Time: 12 minutes. The patient's level of consciousness and vital signs were monitored continuously by radiology nursing throughout the procedure under my direct supervision. CONTRAST:  15 cc Isovue-300 - administered into the gallbladder fossa. FLUOROSCOPY TIME:  1 minutes 6 seconds (294 mGy) COMPLICATIONS: None immediate. PROCEDURE: Informed written consent was obtained from the patient after a discussion of the risks, benefits and alternatives to treatment. Questions regarding the procedure were encouraged and answered. A timeout was performed prior to the initiation of the procedure. The right upper abdominal quadrant was prepped and draped in  the usual sterile fashion, and a sterile drape was applied covering the operative field. Maximum barrier sterile technique with sterile gowns and gloves were used  for the procedure. A timeout was performed prior to the initiation of the procedure. Local anesthesia was provided with 1% lidocaine with epinephrine. Ultrasound scanning of the right upper quadrant demonstrates a mildly dilated gallbladder with minimal amount of associated pericholecystic fluid. Of note, the patient reported pain with ultrasound imaging over the gallbladder. Utilizing a transhepatic approach, a 22 gauge needle was advanced into the gallbladder under direct ultrasound guidance. An ultrasound image was saved for documentation purposes. Appropriate intraluminal puncture was confirmed with the efflux of bile and advancement of an 0.018 wire into the gallbladder lumen. The needle was exchanged for an [place] set. A small amount of contrast was injected to confirm appropriate intraluminal positioning. Over a [place] wire, a 54.2-French Cook cholecystomy tube was advanced into the gallbladder fossa, coiled and locked. Bile was aspirated and a small amount of contrast was injected as several post procedural spot radiographic images were obtained in various obliquities. The catheter was secured to the skin with suture, connected to a drainage bag and a dressing was placed. The patient tolerated the procedure well without immediate post procedural complication. IMPRESSION: Successful ultrasound and fluoroscopic guided placement of a 10.2 French cholecystostomy tube. Electronically Signed   By: Sandi Mariscal M.D.   On: 05/10/2016 10:07    Anti-infectives: Anti-infectives    Start     Dose/Rate Route Frequency Ordered Stop   05/10/16 0800  cefTRIAXone (ROCEPHIN) 2 g in dextrose 5 % 50 mL IVPB    Comments:  Pharmacy may adjust dosing strength / duration / interval for maximal efficacy   2 g 100 mL/hr over 30 Minutes Intravenous Every 24 hours 05/09/16 1052     05/10/16 0800  metroNIDAZOLE (FLAGYL) tablet 500 mg     500 mg Oral Every 8 hours 05/10/16 0755     05/09/16 0900  cefTRIAXone (ROCEPHIN) 2 g  in dextrose 5 % 50 mL IVPB  Status:  Discontinued    Comments:  Pharmacy may adjust dosing strength / duration / interval for maximal efficacy   2 g 100 mL/hr over 30 Minutes Intravenous Every 24 hours 05/09/16 0845 05/09/16 1052   05/09/16 0700  cefTRIAXone (ROCEPHIN) 2 g in dextrose 5 % 50 mL IVPB     2 g 100 mL/hr over 30 Minutes Intravenous  Once 05/09/16 0659 05/09/16 0755      Assessment/Plan: Patient Active Problem List   Diagnosis Date Noted  . Gallstones   . RUQ pain   . Cholecystitis 05/09/2016  . QT prolongation   . OSA and COPD overlap syndrome ([place])   . Generalized weakness   . Arthritis   . Bipolar I disorder ([place])   . Hypokalemia 04/21/2016  . Wheezing 04/12/2016  . (HFpEF) heart failure with preserved ejection fraction ([place])   . Benign essential HTN 03/22/2016  . Rectal bleeding 12/31/2015  . Bipolar affective disorder, mixed, severe, with psychotic behavior ([place]) 11/28/2015  . Cocaine use disorder, severe, dependence ([place]) 11/28/2015  . Hyponatremia 11/25/2015  . AKI (acute kidney injury) ([place]) 11/24/2015  . Blood in sputum   . Chronic diastolic heart failure ([place])   . SOB (shortness of breath)   . Acute on chronic respiratory failure with hypoxia and hypercapnia ([place]) 06/22/2015  . Onychomycosis 01/21/2015  . Nasal congestion 12/17/2014  . Tobacco use disorder 07/22/2014  . COPD (chronic obstructive pulmonary  disease) ([place]) 07/08/2014  . Seborrheic keratoses 12/31/2013  . Knee pain, bilateral 01/22/2013  . Seizure ([place]) 01/04/2013  . Health care maintenance 11/25/2012  . Folliculitis 97/41/6384  . History of kidney stones 06/18/2012  . Chronic pain syndrome 06/18/2012  . Muscle spasm 06/18/2012  . Elevated troponin 04/28/2012  . Allergic reaction 04/07/2012  . Itching 09/06/2011  . HSV infection 08/30/2011  . Dyslipidemia 04/24/2011  . Anemia 04/24/2011  . Leukocytosis 04/24/2011  . Diabetic neuropathy ([place]) 04/24/2011  . Obstructive sleep apnea  10/18/2010  . Asthma 10/18/2010  . Morbid obesity ([place]) 10/18/2010  . Type 2 diabetes mellitus ([place]) 10/18/2010    PERC DRAIN IN PLACE  IR HAS PLACED FOLLOW UP INSTRUCTIONS IN CHART  OK TO FEED     LOS: 2 days    Pedram Goodchild A. 05/11/2016

## 2016-05-11 NOTE — Telephone Encounter (Signed)
Called to check on pt who is hospitalized currently for cholecystitis. She's feeling better since admission, started to eat.  She tells me she'd like to see the nutritionist when she is discharged, this referral has already been placed, provided pt with Dr. Jenne Campus phone number again so she can set this up.  Archie Patten, MD [facility] Family Medicine Resident  05/11/2016, 1:59 PM

## 2016-05-11 NOTE — Patient Outreach (Signed)
[facility]) Care Management  05/11/2016  TEMPERANCE KELEMEN [birth year] [phone]   [name] noted that patient remains hospitalized for Cholecystitis and is slowly able to introduce solid foods back into her diet.  [name] will continue to follow patient while hospitalized, as well as provide community case management services to patient, once discharged. [name], BSW, MSW, LCSW  Licensed Education officer, [facility]  Mailing [place] [address], [place], [place] [address] Physical Address-[address], [place], [place] [address] Toll Free Main # [phone] Fax # [phone] Cell # [phone]  Office # [phone] [email or web address]

## 2016-05-11 NOTE — Progress Notes (Signed)
Previous note was noted for wrong pt. This pt Tiffany [name] is wearing her Cpap tolerating well

## 2016-05-11 NOTE — Progress Notes (Signed)
Incentive spirometry given to patient with instructions on use.  Patient returned demonstration correctly.

## 2016-05-11 NOTE — Progress Notes (Signed)
Family Medicine Teaching Service Daily Progress Note Intern Pager: [phone]  Patient name: Tiffany [name] Medical record number: [id number] Date of birth: 1960/01/21 Age: 56 y.o. Gender: female  Primary Care Provider: Kathrine Cords, MD Consultants: Gen surg, IR Code Status: Full  Pt Overview and Major Events to Date:  4/24 Admit to FPTS  Assessment and Plan: Tiffany [name] is a 56 y.o. female presenting with right upper quadrant abdominal pain and admitted for cholecystitis. PMH is significant for CKD, HFpEF (Echo in 03/2016 with EF60-65% and grade 2 diastolic dysfunction, moderate LV hypertrophy.), COPD, diabetes, OSA, Bipolar, hyperlipidemia.   Cholecystitis: HIDA with filling defect consistent with acute cholecystitis. Afebrile overnight, leukocytosis improved at 11.3 from 15.8. General Surgery on board, pt high risk for surgery, now day #1 s/p perc cholecystostomy by IR.  - ADAT -tylenol, ibuprofen PRN pain - metronidazole 500 mg q8H for anaerobic coverage given fevers and worsening leukocytosis (4/25 - ) - Ceftriaxone (4/24>>) - Holding most home medications due to NPO status, restart when taking PO - Consider physical therapy/OT consult following surgical evaluation and intervention - ISS  HFpEF, stable:  At home, on  lasix 80mg  twice daily. BNP 15.4 at admission. - Home lasix restarted 4/25 now that she is tolerating PO (was held while NPO)  COPD, stable: On 6L [place] O2 at baseline. Followed by Pulmonology. - Continue home medications: Albuterol, Breo, Spiriva  Anemia: Hemoglobin stable 10.3 from 10.2 at admission. - Monitor on CBC  CKD, stable: Baseline ~1.3-1.4. Creatinine 1.1 - Monitor on BMP  Hypokalemia - 3.3 today, repleted with 40 KDUR - monitor AM BMP  DM2: Last A1C 5.8 in 03/2016. Home medications include Metformin - Holding home Metformin - monitor sugars with meals and QHS add SSI if becomes necessary  OSA: CPAP at night.  Bipolar: Home medications  include Geodon, Remeron, and Lexapro, depakote - Holding home medications. Restart once taking PO - ordered depakote level per psych recs - restarted home metformin  HLD: Home medications include Lipitor  - Holding home Lipitor. Restart once taking PO  FEN/GI: NPO Prophylaxis: SCD  Disposition: day #1 s/p IR drain  Subjective:  Patient doing well this AM. Endorses discomfort has improved after placement of the drain. Endorses some generalized pruritis with antibiotics  Objective: Temp:  [98.3 F (36.8 C)-99.5 F (37.5 C)] 99.3 F (37.4 C) (04/26 0912) Pulse Rate:  [82-93] 88 (04/26 0912) Resp:  [18-24] 18 (04/26 0912) BP: (123-173)/(77-97) 123/94 (04/26 0912) SpO2:  [86 %-98 %] 96 % (04/26 0915) Weight:  [293 lb 3.4 oz (133 kg)] 293 lb 3.4 oz (133 kg) (04/25 1951) Physical Exam: General: NAD, rests in bed, obese female Cardiovascular: RRR, no m/r/g Respiratory: CTA bil, distant breath sounds with body habitus Abdomen: soft, nondistended, +mild tender to palpation in RUQ, no rebound tenderness or guarding Extremities: no LE edema, warm and well-perfused  Laboratory:  Lipase 33  istat trop negative   Recent Labs Lab 05/09/16 0419 05/10/16 0644 05/11/16 0525  WBC 11.9* 15.8* 11.3*  HGB 10.2* 9.6* 10.3*  HCT 34.6* 31.2* 33.5*  PLT 257 255 281    Recent Labs Lab 05/09/16 0419 05/10/16 0644 05/11/16 0525  NA 141 138 139  K 3.7 3.6 3.3*  CL 99* 99* 97*  CO2 31 34* 32  BUN 18 11 11   CREATININE 1.08* 1.01* 1.10*  CALCIUM 9.4 9.0 9.5  PROT 6.1*  --   --   BILITOT 0.3  --   --   ALKPHOS 101  --   --  ALT 18  --   --   AST 22  --   --   GLUCOSE 105* 98 89      Imaging/Diagnostic Tests: HIDA scan shows filling defect consistent with acute cholecystitis  Everrett Coombe, MD 05/11/2016, 9:26 AM PGY-1, [place] Intern pager: [phone], text pages welcome

## 2016-05-12 ENCOUNTER — Ambulatory Visit: Payer: Self-pay | Admitting: *Deleted

## 2016-05-12 ENCOUNTER — Encounter: Payer: Self-pay | Admitting: *Deleted

## 2016-05-12 LAB — CBC
HCT: 32.7 % — ABNORMAL LOW (ref 36.0–46.0)
Hemoglobin: 9.9 g/dL — ABNORMAL LOW (ref 12.0–15.0)
MCH: 28.1 pg (ref 26.0–34.0)
MCHC: 30.3 g/dL (ref 30.0–36.0)
MCV: 92.9 fL (ref 78.0–100.0)
Platelets: 336 10*3/uL (ref 150–400)
RBC: 3.52 MIL/uL — ABNORMAL LOW (ref 3.87–5.11)
RDW: 16.3 % — ABNORMAL HIGH (ref 11.5–15.5)
WBC: 11.1 10*3/uL — ABNORMAL HIGH (ref 4.0–10.5)

## 2016-05-12 LAB — BASIC METABOLIC PANEL
Anion gap: 7 (ref 5–15)
BUN: 14 mg/dL (ref 6–20)
CO2: 32 mmol/L (ref 22–32)
Calcium: 8.5 mg/dL — ABNORMAL LOW (ref 8.9–10.3)
Chloride: 103 mmol/L (ref 101–111)
Creatinine, Ser: 1.24 mg/dL — ABNORMAL HIGH (ref 0.44–1.00)
GFR calc Af Amer: 55 mL/min — ABNORMAL LOW (ref >60)
GFR calc non Af Amer: 47 mL/min — ABNORMAL LOW (ref >60)
Glucose, Bld: 89 mg/dL (ref 65–99)
Potassium: 3.7 mmol/L (ref 3.5–5.1)
Sodium: 142 mmol/L (ref 135–145)

## 2016-05-12 LAB — GLUCOSE, CAPILLARY
Glucose-Capillary: 99 mg/dL (ref 65–99)
Glucose-Capillary: 98 mg/dL (ref 65–99)

## 2016-05-12 MED ORDER — POTASSIUM CHLORIDE CRYS ER 20 MEQ PO TBCR
20.0000 meq | EXTENDED_RELEASE_TABLET | Freq: Once | ORAL | Status: AC
  Administered 2016-05-12: 20 meq via ORAL
  Filled 2016-05-12: qty 1

## 2016-05-12 MED ORDER — CIPROFLOXACIN HCL 500 MG PO TABS: 500.0000 mg | ORAL_TABLET | Freq: Two times a day (BID) | ORAL | Status: DC

## 2016-05-12 MED ORDER — CIPROFLOXACIN HCL 500 MG PO TABS: 500.0000 mg | ORAL_TABLET | Freq: Two times a day (BID) | ORAL | 0 refills | Status: AC

## 2016-05-12 NOTE — Progress Notes (Signed)
[facility] Progress Note     Subjective: CC: cholecystitis s/p percutaneous cholecystostomy tube Resting comfortably.   Objective: Vital signs in last 24 hours: Temp:  [98.8 F (37.1 C)-99.5 F (37.5 C)] 99.5 F (37.5 C) (04/27 0439) Pulse Rate:  [77-88] 77 (04/27 0439) Resp:  [17-20] 20 (04/27 0439) BP: (110-139)/(68-94) 117/80 (04/27 0439) SpO2:  [96 %-99 %] 99 % (04/27 0439) Weight:  [130.2 kg (287 lb)] 130.2 kg (287 lb) (04/26 2314) Last BM Date: 05/11/16  Intake/Output from previous day: 04/26 0701 - 04/27 0700 In: 845 [P.O.:780; IV Piggyback:50] Out: 2727 [Urine:2600; Drains:127] Intake/Output this shift: No intake/output data recorded.  PE: Gen:  Alert, NAD, pleasant Card:  Regular rate and rhythm, pedal pulses 2+ BL Pulm:  Normal effort, clear to auscultation bilaterally Abd: Soft, obese, tender to light palpation RUQ, drain site c/d/I, bowel sounds present   Perc GB drain: 127 cc/24h, clear and blood-tinged  Skin: warm and dry, no rashes  Psych: A&Ox3   Lab Results:   Recent Labs  05/11/16 0525 05/12/16 0534  WBC 11.3* 11.1*  HGB 10.3* 9.9*  HCT 33.5* 32.7*  PLT 281 336   BMET  Recent Labs  05/11/16 0525 05/12/16 0534  NA 139 142  K 3.3* 3.7  CL 97* 103  CO2 32 32  GLUCOSE 89 89  BUN 11 14  CREATININE 1.10* 1.24*  CALCIUM 9.5 8.5*   PT/INR  Recent Labs  05/09/16 1625  LABPROT 13.2  INR 1.00   CMP     Component Value Date/Time   NA 142 05/12/2016 0534   NA 145 (H) 04/19/2016 0940   K 3.7 05/12/2016 0534   CL 103 05/12/2016 0534   CO2 32 05/12/2016 0534   GLUCOSE 89 05/12/2016 0534   BUN 14 05/12/2016 0534   BUN 74 (H) 04/19/2016 0940   CREATININE 1.24 (H) 05/12/2016 0534   CREATININE 1.22 (H) 02/18/2016 1146   CALCIUM 8.5 (L) 05/12/2016 0534   PROT 6.1 (L) 05/09/2016 0419   ALBUMIN 3.4 (L) 05/09/2016 0419   AST 22 05/09/2016 0419   ALT 18 05/09/2016 0419   ALKPHOS 101 05/09/2016 0419   BILITOT 0.3  05/09/2016 0419   GFRNONAA 47 (L) 05/12/2016 0534   GFRNONAA 50 (L) 02/18/2016 1146   GFRAA 55 (L) 05/12/2016 0534   GFRAA 57 (L) 02/18/2016 1146   Lipase     Component Value Date/Time   LIPASE 33 05/09/2016 0419   Studies/Results: Ir Perc Cholecystostomy  Result Date: 05/10/2016 INDICATION: Acute cholecystitis.  Poor operative candidate. EXAM: ULTRASOUND AND FLUOROSCOPIC-GUIDED CHOLECYSTOSTOMY TUBE PLACEMENT COMPARISON:  Right upper quadrant abdominal ultrasound - 05/09/2016; nuclear medicine HIDA scan -05/09/2016 MEDICATIONS: The patient is currently admitted to the hospital and on intravenous antibiotics. Antibiotics were administered within an appropriate time frame prior to skin puncture. ANESTHESIA/SEDATION: Moderate (conscious) sedation was employed during this procedure. A total of Versed 1 mg and Fentanyl 50 mcg was administered intravenously. Moderate Sedation Time: 12 minutes. The patient's level of consciousness and vital signs were monitored continuously by radiology nursing throughout the procedure under my direct supervision. CONTRAST:  15 cc Isovue-300 - administered into the gallbladder fossa. FLUOROSCOPY TIME:  1 minutes 6 seconds (916 mGy) COMPLICATIONS: None immediate. PROCEDURE: Informed written consent was obtained from the patient after a discussion of the risks, benefits and alternatives to treatment. Questions regarding the procedure were encouraged and answered. A timeout was performed prior to the initiation of the procedure. The right upper abdominal quadrant was  prepped and draped in the usual sterile fashion, and a sterile drape was applied covering the operative field. Maximum barrier sterile technique with sterile gowns and gloves were used for the procedure. A timeout was performed prior to the initiation of the procedure. Local anesthesia was provided with 1% lidocaine with epinephrine. Ultrasound scanning of the right upper quadrant demonstrates a mildly dilated  gallbladder with minimal amount of associated pericholecystic fluid. Of note, the patient reported pain with ultrasound imaging over the gallbladder. Utilizing a transhepatic approach, a 22 gauge needle was advanced into the gallbladder under direct ultrasound guidance. An ultrasound image was saved for documentation purposes. Appropriate intraluminal puncture was confirmed with the efflux of bile and advancement of an 0.018 wire into the gallbladder lumen. The needle was exchanged for an Warren set. A small amount of contrast was injected to confirm appropriate intraluminal positioning. Over a Benson wire, a 65.2-French Cook cholecystomy tube was advanced into the gallbladder fossa, coiled and locked. Bile was aspirated and a small amount of contrast was injected as several post procedural spot radiographic images were obtained in various obliquities. The catheter was secured to the skin with suture, connected to a drainage bag and a dressing was placed. The patient tolerated the procedure well without immediate post procedural complication. IMPRESSION: Successful ultrasound and fluoroscopic guided placement of a 10.2 French cholecystostomy tube. Electronically Signed   By: Sandi Mariscal M.D.   On: 05/10/2016 10:07    Anti-infectives: Anti-infectives    Start     Dose/Rate Route Frequency Ordered Stop   05/10/16 0800  cefTRIAXone (ROCEPHIN) 2 g in dextrose 5 % 50 mL IVPB    Comments:  Pharmacy may adjust dosing strength / duration / interval for maximal efficacy   2 g 100 mL/hr over 30 Minutes Intravenous Every 24 hours 05/09/16 1052     05/10/16 0800  metroNIDAZOLE (FLAGYL) tablet 500 mg     500 mg Oral Every 8 hours 05/10/16 0755     05/09/16 0900  cefTRIAXone (ROCEPHIN) 2 g in dextrose 5 % 50 mL IVPB  Status:  Discontinued    Comments:  Pharmacy may adjust dosing strength / duration / interval for maximal efficacy   2 g 100 mL/hr over 30 Minutes Intravenous Every 24 hours 05/09/16 0845 05/09/16  1052   05/09/16 0700  cefTRIAXone (ROCEPHIN) 2 g in dextrose 5 % 50 mL IVPB     2 g 100 mL/hr over 30 Minutes Intravenous  Once 05/09/16 0659 05/09/16 0755     Assessment/Plan Acute calculous cholecystitis  S/P percutaneous cholecystostomy placement 05/10/16  - No GS/Cx noted  - afebrile, VSS, leukocytosis improving (11.1) on abx -needs to be seen in IR clinic in 5 -6 weeks and have tube study Follow up with general surgery after study done  FEN - heart healthy diet  ID - Rocephin 4/25 >> VTE - SCD's   Diabetes Mellitus HLD HTN CHF COPD on 6 L home O2- followed by Barnett Abu, MD  Asthma OSA on CPAP  Morbid obesity    LOS: 3 days    Tywanna Alexanders , [facility] 05/12/2016, 7:50 AM Pager: [phone] Consults: [phone] Mon-Fri 7:00 am-4:30 pm Sat-Sun 7:00 am-11:30 am

## 2016-05-12 NOTE — Progress Notes (Signed)
Pt discharge to home with [facility], discharge instructions, medications and follow up appointments discussed and reviewed with pt, verbalized understanding. Telemetry discontinued, CCMD notified, IV discontinued, catheter intact, site clean and dry. Pt was escorted out of the unit in wheelchair, took all belongings with her including her oxygen tank. Prior to discharge pt was educated via teach-back on how to empty her drain.

## 2016-05-12 NOTE — Care Management Note (Signed)
Case Management Note  Patient Details  Name: Tiffany [name] MRN: [id number] Date of Birth: 08/22/1960  Subjective/Objective:            CM following for progression and d/c planning.         Action/Plan: 05/12/2016 Met with pt who selected [facility] for [name] services. Pt has oxygen with small tank in room for d/c to home. Pt also active with [facility].  Expected Discharge Date:    05/12/2016              Expected Discharge Plan:  [place]  In-House Referral:  NA  Discharge planning Services  CM Consult  Post Acute Care Choice:  Home Health Choice offered to:  Patient  DME Arranged:  N/A DME Agency:  NA  HH Arranged:  RN [place] Agency:  [place]  Status of Service:  Completed, signed off  If discussed at [place] of Stay Meetings, dates discussed:    Additional Comments:  Adron Bene, RN 05/12/2016, 10:44 AM

## 2016-05-12 NOTE — Progress Notes (Signed)
Family Medicine Teaching Service Daily Progress Note Intern Pager: [phone]  Patient name: Tiffany [name] Medical record number: [id number] Date of birth: March 30, 1960 Age: 56 y.o. Gender: female  Primary Care Provider: Kathrine Cords, MD Consultants: Gen surg, IR Code Status: Full  Pt Overview and Major Events to Date:  4/24 Admit to FPTS  Assessment and Plan: Tiffany [name] is a 56 y.o. female presenting with right upper quadrant abdominal pain and admitted for cholecystitis. PMH is significant for CKD, HFpEF (Echo in 03/2016 with EF60-65% and grade 2 diastolic dysfunction, moderate LV hypertrophy.), COPD, diabetes, OSA, Bipolar, hyperlipidemia.   Cholecystitis: HIDA with filling defect consistent with acute cholecystitis. Afebrile overnight, leukocytosis resolved. General Surgery on board, pt high risk for surgery, now day #2 s/p perc cholecystostomy by IR.  - tolerating solid diet -tylenol, ibuprofen PRN pain - s/p metronidazole 500 mg q8H for anaerobic coverage given fevers and worsening leukocytosis (4/25 -4/26 ), Ceftriaxone (4/24>>4/26) - transition to ciprofloxacin to complete a 10 day course per surgery recs (last dose 5/3) - ISS - Ambulate with PT prior to DC  HFpEF, stable:  At home, on  lasix 80mg  twice daily. BNP 15.4 at admission. - Home lasix restarted 4/25 now that she is tolerating PO (was held while NPO)  COPD, stable: On 6L [place] O2 at baseline. Followed by Pulmonology. - Continue home medications: Albuterol, Breo, Spiriva  Anemia: Hemoglobin stable 9.9 from 10.2 at admission. - Monitor on CBC  CKD, stable: Baseline ~1.3-1.4. Creatinine 1.24 slowly trending up (1.1 yesterday) - Monitor on BMP  Hypokalemia - improved 3.7 today - monitor AM BMP  DM2: Last A1C 5.8 in 03/2016. Home medications include Metformin - Holding home Metformin - monitor sugars with meals and QHS add SSI if becomes necessary  OSA: CPAP at night.  Bipolar: Home medications include  Geodon, Remeron, and Lexapro, depakote - Holding home medications. Restart once taking PO - restarted home metformin - depakote level low likely due to being held while NPO, restarted now - consider recheck depakote level at follow up  HLD: Home medications include Lipitor  - Holding home Lipitor. Restart once taking PO  FEN/GI: NPO Prophylaxis: SCD  Disposition: day #2 s/p IR drain  Subjective:  Patient did well overnight with no acute events. No complaints this morning. States she feels much better. States she has been up and ambulating without difficulty. Pain is well controlled  Objective: Temp:  [98.7 F (37.1 C)-99.5 F (37.5 C)] 98.7 F (37.1 C) (04/27 0910) Pulse Rate:  [77-86] 85 (04/27 0910) Resp:  [17-20] 19 (04/27 0910) BP: (110-139)/(68-80) 131/77 (04/27 0910) SpO2:  [97 %-99 %] 97 % (04/27 0920) Weight:  [287 lb (130.2 kg)] 287 lb (130.2 kg) (04/26 2314) Physical Exam:  General: NAD, rests in bed, obese female Cardiovascular: RRR, no m/r/g Respiratory: CTA bil, distant breath sounds with body habitus, no W/R/R Abdomen: soft, nondistended, +mild tender to palpation in RUQ, no rebound tenderness or guarding, drain with serosanguinous fluid, insertion site c/d/i Extremities: no LE edema, warm and well-perfused  Laboratory:  Lipase 33  istat trop negative   Recent Labs Lab 05/10/16 0644 05/11/16 0525 05/12/16 0534  WBC 15.8* 11.3* 11.1*  HGB 9.6* 10.3* 9.9*  HCT 31.2* 33.5* 32.7*  PLT 255 281 336    Recent Labs Lab 05/09/16 0419 05/10/16 0644 05/11/16 0525 05/12/16 0534  NA 141 138 139 142  K 3.7 3.6 3.3* 3.7  CL 99* 99* 97* 103  CO2 31 34* 32 32  BUN 18 11 11 14   CREATININE 1.08* 1.01* 1.10* 1.24*  CALCIUM 9.4 9.0 9.5 8.5*  PROT 6.1*  --   --   --   BILITOT 0.3  --   --   --   ALKPHOS 101  --   --   --   ALT 18  --   --   --   AST 22  --   --   --   GLUCOSE 105* 98 89 89      Imaging/Diagnostic Tests: HIDA scan shows filling  defect consistent with acute cholecystitis  Everrett Coombe, MD 05/12/2016, 9:39 AM PGY-1, [place] Intern pager: [phone], text pages welcome

## 2016-05-12 NOTE — Progress Notes (Signed)
Referring Physician(s): Dr  Shann Medal  Supervising Physician: Corrie Mckusick  Patient Status:  [facility] - In-pt  Chief Complaint:  cholecystitis  Subjective:  Perc cholecystostomy drain placed 4/25 Draining well; clearing to serous output No pain per pt  Allergies: Hydroxyzine; Latuda [lurasidone hcl]; Codeine; and Sulfa antibiotics  Medications: Prior to Admission medications   Medication Sig Start Date End Date Taking? Authorizing Provider  acetaminophen (TYLENOL 8 HOUR) 650 MG CR tablet Take 1 tablet (650 mg total) by mouth every 8 (eight) hours as needed for pain. 04/27/16  Yes Archie Patten, MD  albuterol (PROVENTIL) (2.5 MG/3ML) 0.083% nebulizer solution Take 3 mLs (2.5 mg total) by nebulization every 6 (six) hours as needed for wheezing or shortness of breath. 02/18/16  Yes [place] N Rum[place], DO  amLODipine (NORVASC) 5 MG tablet Take 1 tablet (5 mg total) by mouth daily. 05/05/16  Yes Archie Patten, MD  aspirin 81 MG EC tablet Take 1 tablet (81 mg total) by mouth daily. Swallow whole. 12/05/15  Yes Jolanta B Pucilowska, MD  atorvastatin (LIPITOR) 40 MG tablet Take 1 tablet (40 mg total) by mouth daily. 12/05/15  Yes Jolanta B Pucilowska, MD  benztropine (COGENTIN) 0.5 MG tablet Take 1 tablet (0.5 mg total) by mouth daily. 03/31/16 03/31/17 Yes Kathlee Nations, MD  BREO ELLIPTA 100-25 MCG/INH AEPB INHALE 1 PUFF EVERY DAY 02/15/16  Yes Sela Hua, MD  divalproex ([name] ER) 500 MG 24 hr tablet Take 1 tablet (500 mg total) by mouth daily. Patient taking differently: Take 500 mg by mouth at bedtime.  03/31/16 03/31/17 Yes Kathlee Nations, MD  escitalopram (LEXAPRO) 20 MG tablet Take 1 tablet (20 mg total) by mouth daily. 03/31/16  Yes Kathlee Nations, MD  furosemide (LASIX) 40 MG tablet Take 2 tablets (80 mg total) by mouth 2 (two) times daily. 04/15/16  Yes Sela Hua, MD  gabapentin (NEURONTIN) 600 MG tablet Take 1 tablet (600 mg total) by mouth 3 (three) times daily. 12/05/15   Yes Jolanta B Pucilowska, MD  metFORMIN (GLUCOPHAGE-XR) 500 MG 24 hr tablet TAKE 1 TABLET TWICE DAILY 03/28/16  Yes Archie Patten, MD  mirtazapine (REMERON) 30 MG tablet Take 1 tablet (30 mg total) by mouth at bedtime. 03/31/16  Yes Kathlee Nations, MD  montelukast (SINGULAIR) 10 MG tablet TAKE 1 TABLET AT BEDTIME 04/24/16  Yes Archie Patten, MD  Multiple Vitamins-Minerals (MULTIVITAMIN WITH MINERALS) tablet Take 1 tablet by mouth daily. Reported on 07/28/2015 12/05/15  Yes Jolanta B Pucilowska, MD  naproxen (NAPROSYN) 500 MG tablet Take 500 mg by mouth daily.   Yes Historical Provider, MD  nicotine (EQL NICOTINE) 21 mg/24hr patch Place 21 mg onto the skin daily.   Yes Historical Provider, MD  omeprazole (PRILOSEC) 40 MG capsule Take 1 capsule (40 mg total) by mouth daily. Patient taking differently: Take 40 mg by mouth 2 (two) times daily.  12/05/15  Yes Jolanta B Pucilowska, MD  potassium chloride SA (K-DUR,KLOR-CON) 20 MEQ tablet Take 2 tablets (40 mEq total) by mouth 2 (two) times daily. 04/24/16 05/09/16 Yes Everrett Coombe, MD  tiotropium (SPIRIVA HANDIHALER) 18 MCG inhalation capsule Place 1 capsule (18 mcg total) into inhaler and inhale daily. 12/05/15  Yes Jolanta B Pucilowska, MD  ziprasidone (GEODON) 80 MG capsule Take 1 capsule (80 mg total) by mouth at bedtime. 03/31/16  Yes Kathlee Nations, MD  Alcohol Swabs (B-D SINGLE USE SWABS REGULAR) PADS CHECK CAPILLARY BLOOD GLUCOSE ONE  TIME DAILY 02/03/16   Archie Patten, MD  Blood Glucose Monitoring Suppl (ACCU-CHEK AVIVA PLUS) w/Device KIT CHECK BLOOD SUGAR THREE TIMES DAILY 12/02/15   Archie Patten, MD  glucose blood test strip Use to check blood sugar 3 times daily. 12/05/15   Clovis Fredrickson, MD  OXYGEN Inhale 6 L into the lungs daily.    Historical Provider, MD  PRESCRIPTION MEDICATION Inhale into the lungs at bedtime. CPAP    Historical Provider, MD  VENTOLIN HFA 108 (90 Base) MCG/ACT inhaler INHALE 2 PUFFS EVERY 6 HOURS AS NEEDED 05/10/16    Archie Patten, MD     Vital Signs: BP 117/80 (BP Location: Left Arm)   Pulse 77   Temp 99.5 F (37.5 C) (Oral)   Resp 20   Ht '5\' 4"'  (1.626 m)   Wt 287 lb (130.2 kg)   SpO2 99%   BMI 49.26 kg/m   Physical Exam  Abdominal: Soft. Bowel sounds are normal. There is no tenderness.  Musculoskeletal: Normal range of motion.  Neurological: [name] alert.  Skin: Skin is warm and dry.  Site is clean and dry NT No bleeding Output more serous now---blood tinged 125 cc yesterday 50 cc in bag 99.5 this am    Imaging: Dg Chest 2 View  Result Date: 05/09/2016 CLINICAL DATA:  56 year old female with shortness of breath and right-sided chest pain. EXAM: CHEST  2 VIEW COMPARISON:  Chest radiograph dated 04/10/2016 FINDINGS: Top-normal cardiac size. The lungs are clear. There is no pleural effusion or pneumothorax. No acute osseous pathology. IMPRESSION: 1. No acute cardiopulmonary process. 2. Stable top-normal cardiac size. Electronically Signed   By: Anner Crete M.D.   On: 05/09/2016 05:14   Nm Hepatobiliary Liver Func  Result Date: 05/09/2016 CLINICAL DATA:  Right upper quadrant abdominal pain for 1 day. Leukocytosis. Sonographic findings suspicious for acute cholecystitis. EXAM: NUCLEAR MEDICINE HEPATOBILIARY IMAGING TECHNIQUE: Sequential images of the abdomen were obtained out to 60 minutes following intravenous administration of radiopharmaceutical. RADIOPHARMACEUTICALS:  5.1 mCi Tc-1m Choletec IV COMPARISON:  Ultrasound 05/09/2016. FINDINGS: Initial images demonstrate homogeneous hepatic activity and spontaneous filling of the biliary system. There is drainage into duodenum with some reflux into the stomach. No opacification of the gallbladder is identified. Patient refused morphine sulfate administration (typically given to promote contraction of the sphincter of Oddi and increased specificity of non filling of the gallbladder). IMPRESSION: No opacification of the cystic duct or  gallbladder, supporting the presence of acute cholecystitis. Specificity decreased by lack of post morphine imaging. Electronically Signed   By: WRichardean SaleM.D.   On: 05/09/2016 15:31   Ir Perc Cholecystostomy  Result Date: 05/10/2016 INDICATION: Acute cholecystitis.  Poor operative candidate. EXAM: ULTRASOUND AND FLUOROSCOPIC-GUIDED CHOLECYSTOSTOMY TUBE PLACEMENT COMPARISON:  Right upper quadrant abdominal ultrasound - 05/09/2016; nuclear medicine HIDA scan -05/09/2016 MEDICATIONS: The patient is currently admitted to the hospital and on intravenous antibiotics. Antibiotics were administered within an appropriate time frame prior to skin puncture. ANESTHESIA/SEDATION: Moderate (conscious) sedation was employed during this procedure. A total of Versed 1 mg and Fentanyl 50 mcg was administered intravenously. Moderate Sedation Time: 12 minutes. The patient's level of consciousness and vital signs were monitored continuously by radiology nursing throughout the procedure under my direct supervision. CONTRAST:  15 cc Isovue-300 - administered into the gallbladder fossa. FLUOROSCOPY TIME:  1 minutes 6 seconds (1431mGy) COMPLICATIONS: None immediate. PROCEDURE: Informed written consent was obtained from the patient after a discussion of the risks, benefits  and alternatives to treatment. Questions regarding the procedure were encouraged and answered. A timeout was performed prior to the initiation of the procedure. The right upper abdominal quadrant was prepped and draped in the usual sterile fashion, and a sterile drape was applied covering the operative field. Maximum barrier sterile technique with sterile gowns and gloves were used for the procedure. A timeout was performed prior to the initiation of the procedure. Local anesthesia was provided with 1% lidocaine with epinephrine. Ultrasound scanning of the right upper quadrant demonstrates a mildly dilated gallbladder with minimal amount of associated  pericholecystic fluid. Of note, the patient reported pain with ultrasound imaging over the gallbladder. Utilizing a transhepatic approach, a 22 gauge needle was advanced into the gallbladder under direct ultrasound guidance. An ultrasound image was saved for documentation purposes. Appropriate intraluminal puncture was confirmed with the efflux of bile and advancement of an 0.018 wire into the gallbladder lumen. The needle was exchanged for an [place] set. A small amount of contrast was injected to confirm appropriate intraluminal positioning. Over a Benson wire, a 47.2-French Cook cholecystomy tube was advanced into the gallbladder fossa, coiled and locked. Bile was aspirated and a small amount of contrast was injected as several post procedural spot radiographic images were obtained in various obliquities. The catheter was secured to the skin with suture, connected to a drainage bag and a dressing was placed. The patient tolerated the procedure well without immediate post procedural complication. IMPRESSION: Successful ultrasound and fluoroscopic guided placement of a 10.2 French cholecystostomy tube. Electronically Signed   By: Sandi Mariscal M.D.   On: 05/10/2016 10:07   US Abdomen Limited Ruq  Result Date: 05/09/2016 CLINICAL DATA:  56 year old female with right upper quadrant abdominal pain. EXAM: US ABDOMEN LIMITED - RIGHT UPPER QUADRANT COMPARISON:  Abdominal radiograph dated 04/10/2016 FINDINGS: Gallbladder: There multiple stones within the gallbladder. The gallbladder wall is thickened and edematous measuring 7 mm in thickness. There is trace pericholecystic fluid. Tenderness was elicited over the gallbladder area during scanning. Common bile duct: Diameter: 7 mm. Liver: No focal lesion identified. Within normal limits in parenchymal echogenicity. IMPRESSION: Cholelithiasis with findings suspicious for acute cholecystitis. A hepatobiliary scintigraphy may provide better evaluation of the gallbladder if  clinically indicated. Electronically Signed   By: Anner Crete M.D.   On: 05/09/2016 06:48    Labs:  CBC:  Recent Labs  05/09/16 0419 05/10/16 0644 05/11/16 0525 05/12/16 0534  WBC 11.9* 15.8* 11.3* 11.1*  HGB 10.2* 9.6* 10.3* 9.9*  HCT 34.6* 31.2* 33.5* 32.7*  PLT 257 255 281 336    COAGS:  Recent Labs  05/09/16 1625  INR 1.00    BMP:  Recent Labs  05/09/16 0419 05/10/16 0644 05/11/16 0525 05/12/16 0534  NA 141 138 139 142  K 3.7 3.6 3.3* 3.7  CL 99* 99* 97* 103  CO2 31 34* 32 32  GLUCOSE 105* 98 89 89  BUN '18 11 11 14  ' CALCIUM 9.4 9.0 9.5 8.5*  CREATININE 1.08* 1.01* 1.10* 1.24*  GFRNONAA 56* >60 55* 47*  GFRAA >60 >60 >60 55*    LIVER FUNCTION TESTS:  Recent Labs  07/09/15 1325 11/24/15 2116 04/10/16 1650 04/22/16 1017 05/09/16 0419  BILITOT 1.5* 0.9 0.4  --  0.3  AST '15 18 20  ' --  22  ALT 15 12* 18  --  18  ALKPHOS 100 122 95  --  101  PROT 7.3 6.5 6.1*  --  6.1*  ALBUMIN 3.9 3.4* 3.3* 3.2*  3.4*    Assessment and Plan:  Percutaneous cholecystostomy drain in place For recheck in 5 weeks at IR OP drain clinic Order in place She will hear from scheduler for time and date Must flush drain daily with 5 cc sterile saline at home Plan per [facility]  Electronically Signed: Quention Mcneill A 05/12/2016, 7:56 AM   I spent a total of 15 Minutes at the the patient's bedside AND on the patient's hospital floor or unit, greater than 50% of which was counseling/coordinating care for perc chole drain

## 2016-05-12 NOTE — Care Management Important Message (Signed)
Important Message  Patient Details  Name: Tiffany [name] MRN: [id number] Date of Birth: Jun 26, 1960   Medicare Important Message Given:  Yes    Nathen May 05/12/2016, 2:38 PM

## 2016-05-12 NOTE — Progress Notes (Signed)
Pharmacy Antibiotic Note  Tiffany [name] is a 56 y.o. female admitted on 05/09/2016 with cholecystitis s/p percutaneous drain placement on 4/25.  Pharmacy has been consulted for ciprofloxacin dosing. Plan is for 10 days of antibiotic therapy. Today is day #4 antibiotics. Renal function fairly stable with SCr between 1-1.2. Afeb, WBC down to 11.1.  Plan: Ciprofloxacin 500 mg PO bid - first dose tonight Stop date set in place Pharmacy signing off, please re-consult if needed  Height: 5\' 4"  (162.6 cm) Weight: 287 lb (130.2 kg) IBW/kg (Calculated) : 54.7  Temp (24hrs), Avg:99 F (37.2 C), Min:98.7 F (37.1 C), Max:99.5 F (37.5 C)   Recent Labs Lab 05/09/16 0419 05/10/16 0644 05/11/16 0525 05/12/16 0534  WBC 11.9* 15.8* 11.3* 11.1*  CREATININE 1.08* 1.01* 1.10* 1.24*    Estimated Creatinine Clearance: 67.1 mL/min (A) (by C-G formula based on SCr of 1.24 mg/dL (H)).    Allergies  Allergen Reactions  . Hydroxyzine Shortness Of Breath    -throat closed up  . Latuda [Lurasidone Hcl] Anaphylaxis  . Codeine Nausea And Vomiting  . Sulfa Antibiotics Itching    Antimicrobials this admission: ceftriaxone 4/24 >> 4/27 metronidazole 4/25 >> 4/27 ciprofloxacin 4/27 >>  Dose adjustments this admission: none  Microbiology results: none  Thank you for allowing pharmacy to be a part of this patient's care.  Renold Genta, PharmD, BCPS Clinical Pharmacist Phone for today - [place] - [phone] 05/12/2016 9:49 AM

## 2016-05-12 NOTE — Evaluation (Signed)
Physical Therapy Evaluation Patient Details Name: Tiffany [name] MRN: [id number] DOB: 06-03-1960 Today's Date: 05/12/2016   History of Present Illness  Pt is a 56 yo female admitted on 05/09/16 for RUQ pain. Pt was diagnosed with cholecystitis and had a drain placed on 05/11/16. PMH significant for CKD, HF, COPD, DM, OSA, bipolar, HLD.   Clinical Impression  Pt presents with the above diagnosis and below deficits for therapy evaluation. Prior to admission, pt was living with her son who is 86 in a one level home. Pt is able to perform mobility with supervision to Mod I this session with 6L O2 in place. Pt will benefit from continued acute therapy in order to address below deficits before discharge to venue recommended below.     Follow Up Recommendations Home health PT;Supervision/Assistance - 24 hour    Equipment Recommendations  None recommended by PT    Recommendations for Other Services       Precautions / Restrictions Precautions Precautions: Fall Precaution Comments: Drain, 6L O2 Restrictions Weight Bearing Restrictions: No      Mobility  Bed Mobility               General bed mobility comments:  (pt up up in chair when PT arrives)  Transfers Overall transfer level: Needs assistance Equipment used: None Transfers: Sit to/from Stand Sit to Stand: Modified independent (Device/Increase time)         General transfer comment: pt with good technique  Ambulation/Gait Ambulation/Gait assistance: Supervision Ambulation Distance (Feet): 75 Feet Assistive device: None Gait Pattern/deviations: Step-through pattern;Wide base of support Gait velocity: decreased Gait velocity interpretation: Below normal speed for age/gender General Gait Details: pt with good sequencing on 6L O2. O2 sats remain above 90% with mobility.   Stairs            Wheelchair Mobility    Modified Rankin (Stroke Patients Only)       Balance Overall balance assessment: Needs  assistance Sitting-balance support: No upper extremity supported;Feet supported Sitting balance-Leahy Scale: Good     Standing balance support: No upper extremity supported;During functional activity Standing balance-Leahy Scale: Good                               Pertinent Vitals/Pain Pain Assessment: No/denies pain    Home Living Family/patient expects to be discharged to:: Private residence Living Arrangements: Children Available Help at Discharge: Family;Available 24 hours/day Type of Home: House Home Access: Ramped entrance     Home Layout: One level Home Equipment: Grab bars - tub/shower;Cane - single point;Walker - 2 wheels;Bedside commode Additional Comments: home O2-6L    Prior Function Level of Independence: Independent with assistive device(s)         Comments: cane for mobility recently, drives, occasional assist with LB ADL     Hand Dominance   Dominant Hand: Right    Extremity/Trunk Assessment   Upper Extremity Assessment Upper Extremity Assessment: Overall WFL for tasks assessed    Lower Extremity Assessment Lower Extremity Assessment: Generalized weakness    Cervical / Trunk Assessment Cervical / Trunk Assessment: Normal  Communication   Communication: No difficulties  Cognition Arousal/Alertness: Awake/alert Behavior During Therapy: WFL for tasks assessed/performed Overall Cognitive Status: Within Functional Limits for tasks assessed  General Comments      Exercises     Assessment/Plan    PT Assessment Patient needs continued PT services  PT Problem List Decreased activity tolerance;Decreased mobility       PT Treatment Interventions Gait training;Functional mobility training;Therapeutic activities;Therapeutic exercise;Balance training    PT Goals (Current goals can be found in the Care Plan section)  Acute Rehab PT Goals Patient Stated Goal: home  independent PT Goal Formulation: With patient Time For Goal Achievement: 05/19/16 Potential to Achieve Goals: Good    Frequency Min 2X/week   Barriers to discharge        Co-evaluation               End of Session Equipment Utilized During Treatment: Oxygen;Gait belt Activity Tolerance: Patient tolerated treatment well Patient left: in chair;with call bell/phone within reach Nurse Communication: Mobility status PT Visit Diagnosis: History of falling (Z91.81);Muscle weakness (generalized) (M62.81)    Time: 3794-3276 PT Time Calculation (min) (ACUTE ONLY): 19 min   Charges:   PT Evaluation $PT Eval Low Complexity: 1 Procedure     PT G Codes:        Scheryl Marten PT, DPT  [phone]   Shanon Rosser 05/12/2016, 11:17 AM

## 2016-05-12 NOTE — Discharge Instructions (Signed)
It was a pleasure caring for you while you were here in the hospital. You were hospitalized with inflammation in your gall bladder, and a drain was placed while you were hospitalized. This drain will remain in place at discharge. Please follow up with surgery, radiology and your regular doctor as documented in your discharge paperwork.   Please continue your antibiotics to complete your course after discharge.

## 2016-05-14 DIAGNOSIS — F1420 Cocaine dependence, uncomplicated: Secondary | ICD-10-CM | POA: Diagnosis not present

## 2016-05-14 DIAGNOSIS — I130 Hypertensive heart and chronic kidney disease with heart failure and stage 1 through stage 4 chronic kidney disease, or unspecified chronic kidney disease: Secondary | ICD-10-CM | POA: Diagnosis not present

## 2016-05-14 DIAGNOSIS — I5032 Chronic diastolic (congestive) heart failure: Secondary | ICD-10-CM | POA: Diagnosis not present

## 2016-05-14 DIAGNOSIS — J449 Chronic obstructive pulmonary disease, unspecified: Secondary | ICD-10-CM | POA: Diagnosis not present

## 2016-05-14 DIAGNOSIS — N189 Chronic kidney disease, unspecified: Secondary | ICD-10-CM | POA: Diagnosis not present

## 2016-05-14 DIAGNOSIS — E119 Type 2 diabetes mellitus without complications: Secondary | ICD-10-CM | POA: Diagnosis not present

## 2016-05-14 DIAGNOSIS — F3164 Bipolar disorder, current episode mixed, severe, with psychotic features: Secondary | ICD-10-CM | POA: Diagnosis not present

## 2016-05-14 DIAGNOSIS — K819 Cholecystitis, unspecified: Secondary | ICD-10-CM | POA: Diagnosis not present

## 2016-05-14 DIAGNOSIS — Z4682 Encounter for fitting and adjustment of non-vascular catheter: Secondary | ICD-10-CM | POA: Diagnosis not present

## 2016-05-15 ENCOUNTER — Other Ambulatory Visit: Payer: Self-pay | Admitting: *Deleted

## 2016-05-15 DIAGNOSIS — N189 Chronic kidney disease, unspecified: Secondary | ICD-10-CM | POA: Diagnosis not present

## 2016-05-15 DIAGNOSIS — F3164 Bipolar disorder, current episode mixed, severe, with psychotic features: Secondary | ICD-10-CM | POA: Diagnosis not present

## 2016-05-15 DIAGNOSIS — I130 Hypertensive heart and chronic kidney disease with heart failure and stage 1 through stage 4 chronic kidney disease, or unspecified chronic kidney disease: Secondary | ICD-10-CM | POA: Diagnosis not present

## 2016-05-15 DIAGNOSIS — Z4682 Encounter for fitting and adjustment of non-vascular catheter: Secondary | ICD-10-CM | POA: Diagnosis not present

## 2016-05-15 DIAGNOSIS — I5032 Chronic diastolic (congestive) heart failure: Secondary | ICD-10-CM | POA: Diagnosis not present

## 2016-05-15 DIAGNOSIS — F1420 Cocaine dependence, uncomplicated: Secondary | ICD-10-CM | POA: Diagnosis not present

## 2016-05-15 DIAGNOSIS — J449 Chronic obstructive pulmonary disease, unspecified: Secondary | ICD-10-CM | POA: Diagnosis not present

## 2016-05-15 DIAGNOSIS — E119 Type 2 diabetes mellitus without complications: Secondary | ICD-10-CM | POA: Diagnosis not present

## 2016-05-15 DIAGNOSIS — K819 Cholecystitis, unspecified: Secondary | ICD-10-CM | POA: Diagnosis not present

## 2016-05-15 NOTE — Patient Outreach (Signed)
[facility]) Care Management  05/15/2016  Tiffany [name] 25-Nov-1960 [id number]   Notified that member was discharged from hospital on Friday, 4/27.  Call placed to restart transition of care program.  She report that her pain/discomfort is much better then when she was admitted.  She state that the home health nurse has started home visits and she has been educated on how to flush and empty her drain daily.  She report compliance with medications, including newly prescribed antibiotics.  Patient was recently discharged from hospital and all medications have been reviewed.    Member denies any shortness of breath or discomfort at this time, report compliance with her oxygen.  She state that she has attempted to wean herself off, but has been unable due to low saturations.  She has follow up appointments with her primary MD and interventional radiology (for drain assessment) within the next few weeks, appointment with pulmonologist in June.  She continues to verbalize understanding of heart failure management/zones, denies any questions.  Will follow up next week.  [name], [place], MSN [place] [phone]

## 2016-05-16 ENCOUNTER — Telehealth: Payer: Self-pay | Admitting: Family Medicine

## 2016-05-16 ENCOUNTER — Telehealth (HOSPITAL_COMMUNITY): Payer: Self-pay

## 2016-05-16 DIAGNOSIS — I5032 Chronic diastolic (congestive) heart failure: Secondary | ICD-10-CM | POA: Diagnosis not present

## 2016-05-16 DIAGNOSIS — F3164 Bipolar disorder, current episode mixed, severe, with psychotic features: Secondary | ICD-10-CM | POA: Diagnosis not present

## 2016-05-16 DIAGNOSIS — J449 Chronic obstructive pulmonary disease, unspecified: Secondary | ICD-10-CM | POA: Diagnosis not present

## 2016-05-16 DIAGNOSIS — Z4682 Encounter for fitting and adjustment of non-vascular catheter: Secondary | ICD-10-CM | POA: Diagnosis not present

## 2016-05-16 DIAGNOSIS — N189 Chronic kidney disease, unspecified: Secondary | ICD-10-CM | POA: Diagnosis not present

## 2016-05-16 DIAGNOSIS — F1420 Cocaine dependence, uncomplicated: Secondary | ICD-10-CM | POA: Diagnosis not present

## 2016-05-16 DIAGNOSIS — K819 Cholecystitis, unspecified: Secondary | ICD-10-CM | POA: Diagnosis not present

## 2016-05-16 DIAGNOSIS — I130 Hypertensive heart and chronic kidney disease with heart failure and stage 1 through stage 4 chronic kidney disease, or unspecified chronic kidney disease: Secondary | ICD-10-CM | POA: Diagnosis not present

## 2016-05-16 DIAGNOSIS — E119 Type 2 diabetes mellitus without complications: Secondary | ICD-10-CM | POA: Diagnosis not present

## 2016-05-16 NOTE — Telephone Encounter (Signed)
Need to be seen earlier if dosage needs to be adjusted

## 2016-05-16 NOTE — Telephone Encounter (Signed)
Will forward to PCP.  Martin, Tamika L, RN  

## 2016-05-16 NOTE — Telephone Encounter (Signed)
Okay, called patient and put her in for this Thursday morning.

## 2016-05-16 NOTE — Telephone Encounter (Signed)
Pt believes potassium might be low. Pt's fingers are cramping and she is unable to hold things, toes are cramping as well. Pt said this happened before and it caused falls. Pt states its not as bad as it was before though. I scheduled pt to come in tomorrow @ 4:15 with PCP. ep

## 2016-05-16 NOTE — Telephone Encounter (Signed)
Spoke with patient regarding her symptoms.  Pt stated she can't hold anything in her hands and she loses control of her limbs due to the cramping. Pt denies chest pain and tingling sensation, but she does report SOB which her PCP is aware.  Derl Barrow, RN

## 2016-05-16 NOTE — Telephone Encounter (Signed)
Patient is calling about her [name], she had a level drawn on 4/26 and it was 18. Patient said they did not do a psych consult in the hospital and would like to know if you will increase it. She returns to see you on June 14. Please review and advise, thank you

## 2016-05-16 NOTE — Telephone Encounter (Signed)
Patient states she had 1 episode of this weakness and cramping last night and one this morning. None since.  She states her breathing is at baseline.  Advised pt to take an extra dose of potassium as she was previously hospitalized due to low K.  Discussed reasons to go to the ED.   Advised pt to keep appt with me tomorrow.  Archie Patten, MD [facility] Family Medicine Resident  05/16/2016, 6:02 PM

## 2016-05-17 ENCOUNTER — Telehealth: Payer: Self-pay | Admitting: *Deleted

## 2016-05-17 ENCOUNTER — Encounter: Payer: Self-pay | Admitting: Family Medicine

## 2016-05-17 ENCOUNTER — Ambulatory Visit (INDEPENDENT_AMBULATORY_CARE_PROVIDER_SITE_OTHER): Payer: Medicare HMO | Admitting: Family Medicine

## 2016-05-17 ENCOUNTER — Ambulatory Visit (HOSPITAL_COMMUNITY): Payer: Self-pay | Admitting: Clinical

## 2016-05-17 ENCOUNTER — Other Ambulatory Visit: Payer: Self-pay | Admitting: Family Medicine

## 2016-05-17 ENCOUNTER — Other Ambulatory Visit: Payer: Self-pay | Admitting: Pharmacist

## 2016-05-17 VITALS — HR 88 | Temp 98.4°F | Ht 64.0 in | Wt 292.0 lb

## 2016-05-17 DIAGNOSIS — I10 Essential (primary) hypertension: Secondary | ICD-10-CM

## 2016-05-17 DIAGNOSIS — R252 Cramp and spasm: Secondary | ICD-10-CM

## 2016-05-17 NOTE — Telephone Encounter (Signed)
Tillie Rung, Physical Therapist with [facility] called to request physical therapy orders.  Physical therapy twice a week for 3 weeks then once a week for 2 weeks for activity tolerance.  Order given by Dr. Ree Kida.  Derl Barrow, RN

## 2016-05-17 NOTE — Progress Notes (Signed)
Subjective: CC: hands cramping HPI: Patient is a 56 y.o. female with a past medical history of CKD 2, diastolic heart failure, COPD, type 2 diabetes, bipolar disorder  presenting to clinic today for a same day appt due to hand cramping.  She notes the day prior to her appt  (5/1) she was unable to hold things in her hands due to cramping. She dropped stuff that AM due to her symptoms. She felt it was similar to when she was hospitalized previously due to hypokalemia. I spoke to her that evening around and advised her to take an additional dose of potassium (she was advised to hold potassium on D/C, however had continued to take 28mQ BID).   Today she states she's doing much better. No cramping in her hands since the prior evening. No dropping things. She's taken 2 doses of potassium today.   Breathing doing better, still on 6L [place].  Still has significant DOE which her pulmonologist told her is expected.  Doing well since hospital d/c, no RUQ pain.   Social History: still smoke free!   ROS: All other systems reviewed and are negative.  Past Medical History Patient Active Problem List   Diagnosis Date Noted  . Gallstones   . Upper abdominal pain   . Cholecystitis 05/09/2016  . QT prolongation   . OSA and COPD overlap syndrome (HC[place]   . Generalized weakness   . Arthritis   . Bipolar I disorder ([place]   . Hypokalemia 04/21/2016  . Wheezing 04/12/2016  . (HFpEF) heart failure with preserved ejection fraction (HCimarron [place]   . Benign essential HTN 03/22/2016  . Rectal bleeding 12/31/2015  . Bipolar affective disorder, mixed, severe, with psychotic behavior (HM[place] 11/28/2015  . Cocaine use disorder, severe, dependence ([place] 11/28/2015  . Hyponatremia 11/25/2015  . AKI (acute kidney injury) (H[place] 11/24/2015  . Blood in sputum   . Chronic diastolic heart failure (HM[place]   . SOB (shortness of breath)   . Acute on chronic respiratory failure with hypoxia and hypercapnia (HLos [place] 06/22/2015  .  Onychomycosis 01/21/2015  . Nasal congestion 12/17/2014  . Tobacco use disorder 07/22/2014  . COPD (chronic obstructive pulmonary disease) (H[place] 07/08/2014  . Seborrheic keratoses 12/31/2013  . Knee pain, bilateral 01/22/2013  . Seizure (HC[place]ah[place] 01/04/2013  . Health care maintenance 11/25/2012  . Folliculitis 140/08/6759 . History of kidney stones 06/18/2012  . Chronic pain syndrome 06/18/2012  . Muscle spasm 06/18/2012  . Elevated troponin 04/28/2012  . Allergic reaction 04/07/2012  . Itching 09/06/2011  . HSV infection 08/30/2011  . Dyslipidemia 04/24/2011  . Anemia 04/24/2011  . Leukocytosis 04/24/2011  . Diabetic neuropathy (H[place] 04/24/2011  . Obstructive sleep apnea 10/18/2010  . Asthma 10/18/2010  . Morbid obesity (H[place] 10/18/2010  . Type 2 diabetes mellitus (HH[place] 10/18/2010    Medications- reviewed and updated Current Outpatient Prescriptions  Medication Sig Dispense Refill  . acetaminophen (TYLENOL 8 HOUR) 650 MG CR tablet Take 1 tablet (650 mg total) by mouth every 8 (eight) hours as needed for pain. 60 tablet 0  . albuterol (PROVENTIL) (2.5 MG/3ML) 0.083% nebulizer solution Take 3 mLs (2.5 mg total) by nebulization every 6 (six) hours as needed for wheezing or shortness of breath. 150 mL 1  . Alcohol Swabs (B-D SINGLE USE SWABS REGULAR) PADS CHECK CAPILLARY BLOOD GLUCOSE ONE TIME DAILY 100 each 3  . amLODipine (NORVASC) 5 MG tablet Take 1 tablet (5 mg total) by mouth daily. 90 tablet 0  . aspirin 81  MG EC tablet Take 1 tablet (81 mg total) by mouth daily. Swallow whole. 30 tablet 1  . atorvastatin (LIPITOR) 40 MG tablet Take 1 tablet (40 mg total) by mouth daily. 30 tablet 1  . benztropine (COGENTIN) 0.5 MG tablet Take 1 tablet (0.5 mg total) by mouth daily. 30 tablet 2  . Blood Glucose Monitoring Suppl (ACCU-CHEK AVIVA PLUS) w/Device KIT CHECK BLOOD SUGAR THREE TIMES DAILY 1 kit 0  . BREO ELLIPTA 100-25 MCG/INH AEPB INHALE 1 PUFF EVERY DAY 180 each 0  . ciprofloxacin  (CIPRO) 500 MG tablet Take 1 tablet (500 mg total) by mouth 2 (two) times daily. 12 tablet 0  . divalproex ([name] ER) 500 MG 24 hr tablet Take 1 tablet (500 mg total) by mouth daily. (Patient taking differently: Take 500 mg by mouth at bedtime. ) 30 tablet 2  . escitalopram (LEXAPRO) 20 MG tablet Take 1 tablet (20 mg total) by mouth daily. 30 tablet 2  . furosemide (LASIX) 40 MG tablet Take 2 tablets (80 mg total) by mouth 2 (two) times daily. 120 tablet 0  . gabapentin (NEURONTIN) 600 MG tablet Take 1 tablet (600 mg total) by mouth 3 (three) times daily. 90 tablet 1  . glucose blood test strip Use to check blood sugar 3 times daily. 100 each 12  . metFORMIN (GLUCOPHAGE-XR) 500 MG 24 hr tablet TAKE 1 TABLET TWICE DAILY 180 tablet 0  . mirtazapine (REMERON) 30 MG tablet Take 1 tablet (30 mg total) by mouth at bedtime. 30 tablet 2  . montelukast (SINGULAIR) 10 MG tablet TAKE 1 TABLET AT BEDTIME 90 tablet 3  . Multiple Vitamins-Minerals (MULTIVITAMIN WITH MINERALS) tablet Take 1 tablet by mouth daily. Reported on 07/28/2015 30 tablet 1  . naproxen (NAPROSYN) 500 MG tablet Take 500 mg by mouth daily.    . nicotine (EQL NICOTINE) 21 mg/24hr patch Place 21 mg onto the skin daily.    [name] omeprazole (PRILOSEC) 40 MG capsule Take 1 capsule (40 mg total) by mouth daily. (Patient taking differently: Take 40 mg by mouth 2 (two) times daily. ) 30 capsule 1  . OXYGEN Inhale 6 L into the lungs daily.    [name] PRESCRIPTION MEDICATION Inhale into the lungs at bedtime. CPAP    . tiotropium (SPIRIVA HANDIHALER) 18 MCG inhalation capsule Place 1 capsule (18 mcg total) into inhaler and inhale daily. 90 capsule 1  . VENTOLIN HFA 108 (90 Base) MCG/ACT inhaler INHALE 2 PUFFS EVERY 6 HOURS AS NEEDED 18 g 6  . ziprasidone (GEODON) 80 MG capsule Take 1 capsule (80 mg total) by mouth at bedtime. 30 capsule 2   No current facility-administered medications for this visit.     Objective: Office vital signs reviewed. Pulse 88    Temp 98.4 F (36.9 C) (Oral)   Ht 5' 4" (1.626 m)   Wt 292 lb (132.5 kg)   SpO2 91% Comment: 6 L O2  BMI 50.12 kg/m    Physical Examination:  General: Awake, alert, well- nourished, NAD Cardio: RRR, no m/r/g noted.  Pulm: No increased WOB.  CTAB, without wheezes, rhonchi or crackles noted. On 6L [place].  MSK: normal to inspection with no tremor. Good hand grip/symmetric. Normal DRTs. No clonus. No spasticity noted.    Assessment/Plan:  Hand cramping: suspect due to hypokalemia. Pt presenting with hand cramping that has seemed to improve since we increased the frequency of her potassium. No red flags on exam or history. Will get BMET today. Advised to continue with  potassium 69mQ TID; I will call her in the AM when her results return with further instructions.  She's had significant changes to her diuretics and her regimen (hospitalized 3 times in the last 2 months); will need to monitor BMET closer to determine appropriate regimen.   Orders Placed This Encounter  Procedures  . Basic metabolic panel    No orders of the defined types were placed in this encounter.   CArchie PattenPGY-3, CIn[place]

## 2016-05-17 NOTE — Patient Instructions (Addendum)
Continue with potassium 40mg  three times per day.  I will call you tomorrow with the results of your blood work and instructions on what to do.

## 2016-05-18 ENCOUNTER — Telehealth: Payer: Self-pay | Admitting: Family Medicine

## 2016-05-18 ENCOUNTER — Ambulatory Visit: Payer: Self-pay | Admitting: Family Medicine

## 2016-05-18 ENCOUNTER — Ambulatory Visit (HOSPITAL_COMMUNITY): Payer: Self-pay | Admitting: Psychiatry

## 2016-05-18 LAB — BASIC METABOLIC PANEL
BUN/Creatinine Ratio: 15 (ref 9–23)
BUN: 24 mg/dL (ref 6–24)
CO2: 32 mmol/L — ABNORMAL HIGH (ref 18–29)
Calcium: 10 mg/dL (ref 8.7–10.2)
Chloride: 101 mmol/L (ref 96–106)
Creatinine, Ser: 1.58 mg/dL — ABNORMAL HIGH (ref 0.57–1.00)
GFR calc Af Amer: 42 mL/min/{1.73_m2} — ABNORMAL LOW (ref >59)
GFR calc non Af Amer: 36 mL/min/{1.73_m2} — ABNORMAL LOW (ref >59)
Glucose: 76 mg/dL (ref 65–99)
Potassium: 5.3 mmol/L — ABNORMAL HIGH (ref 3.5–5.2)
Sodium: 145 mmol/L — ABNORMAL HIGH (ref 134–144)

## 2016-05-18 NOTE — Patient Outreach (Signed)
[facility]) Care Management  05/18/2016  Tiffany [name] 03-27-1960 [id number]  Patient was called to follow up with her post discharge. No answer. HIPAA compliant message left on her voicemail.  Plan:  Call the patient back within 3 business days.   Elayne Guerin, PharmD, [place] Clinical Pharmacist [phone]

## 2016-05-18 NOTE — Telephone Encounter (Signed)
Patient's K noted to be high at 5.3. We were presuming it was low due to similar symptoms when her K was 2.5.  Attempted to call pt to let her know to only do 1 dose of the potassium 34mEQ, however no answer and unable to leave a voicemail.  Will attempt to call later this AM.  Archie Patten, MD [facility] Family Medicine Resident  05/18/2016, 8:24 AM

## 2016-05-18 NOTE — Telephone Encounter (Signed)
Called pt again. She has already taken 1 dose of potassium today.  No more potassium today.  Will go back to potassium BID tomorrow.   Pt to f/u early next week for repeat BMET given K 5.3 and SCr increased to 1.58 from 1.24.   Archie Patten, MD [facility] Family Medicine Resident  05/18/2016, 12:00 PM

## 2016-05-19 ENCOUNTER — Other Ambulatory Visit: Payer: Self-pay | Admitting: *Deleted

## 2016-05-19 ENCOUNTER — Telehealth: Payer: Self-pay | Admitting: Internal Medicine

## 2016-05-19 NOTE — Patient Outreach (Signed)
[facility]) Care Management  05/19/2016  [name] [birth year] [id number]   [name] made an attempt to try and contact patient today remind her of the initial home visit, scheduled today at 3:00pm; however, patient was unavailable.  A HIPAA compliant message was left for patient on voicemail.  [name] encouraged patient to contact [name] at her earliest convenience to confirm the appointment scheduled for today.  [name] is currently awaiting a return call. [name], BSW, MSW, LCSW  Licensed Education officer, environmental [facility]  Mailing [place] [address], [place], [place] [address] Physical Address-[address], [place], [place] [address] Toll Free Main # [phone] Fax # [phone] Cell # [phone]  Office # [phone] [email or web address]

## 2016-05-19 NOTE — Patient Outreach (Signed)
[facility]) Care Management  05/19/2016  Tiffany [name] Jul 07, 1960 [id number]  [name] drove out to patient's home today to try and perform the initial home visit, scheduled for 3:00pm; however, patient was unavailable.  [name] knocked on patient's door several times, with no answer.  [name] also tried calling patient, without success.  [name] will try and make contact with patient to reschedule the initial home visit. [name], BSW, MSW, LCSW  Licensed Education officer, environmental [facility]  Mailing [place] [address] [place], [place] [address] Physical Address[address], [place], [place] [address] Toll Free Main # [phone] Fax # [phone] Cell # [phone]  Office # [phone] [email or web address]

## 2016-05-19 NOTE — Telephone Encounter (Signed)
After Hours Emergency Line Call:   Patient s/p percutaneous cholecystectomy, approximately 10 days post-op, calling due to pain at the site of the drain today. Reports she has not had much pain for several days but today she ate more than she has previously and has also been more active. Notes that she really only notices the pain with position changes, lying in bed to standing for example. Drain has continued to put out clear drainage. Denies bloody or pus-like drainage. No surrounding, spreading erythema at drain site. Denies fever, nausea, and vomiting. Discussed with patient that I would anticipate some minimal discomfort with foreign body but recommended calling IR office after hours line to discuss this concern as they more routinely deal with these types of cases. Low suspicion for infectious process based on report from patient. Patient does not sound like she is in significant distress on the phone and she agrees that she does not feel like she needs to go to the ED presently. Discussed that if pain were to worsen significantly, occur continuously at rest, if she were to develop fevers, systemic symptoms, or changes in the skin around the drain/changes in the nature of the fluid draining that she would need to be evaluated.   Phill Myron, D.O. 05/20/2016, 12:02 AM PGY-2, [place]

## 2016-05-22 ENCOUNTER — Telehealth: Payer: Self-pay | Admitting: *Deleted

## 2016-05-22 ENCOUNTER — Telehealth: Payer: Self-pay | Admitting: Family Medicine

## 2016-05-22 ENCOUNTER — Other Ambulatory Visit: Payer: Self-pay | Admitting: Pharmacist

## 2016-05-22 ENCOUNTER — Ambulatory Visit (HOSPITAL_COMMUNITY): Payer: Self-pay | Admitting: Psychiatry

## 2016-05-22 DIAGNOSIS — I5032 Chronic diastolic (congestive) heart failure: Secondary | ICD-10-CM | POA: Diagnosis not present

## 2016-05-22 DIAGNOSIS — Z4682 Encounter for fitting and adjustment of non-vascular catheter: Secondary | ICD-10-CM | POA: Diagnosis not present

## 2016-05-22 DIAGNOSIS — I130 Hypertensive heart and chronic kidney disease with heart failure and stage 1 through stage 4 chronic kidney disease, or unspecified chronic kidney disease: Secondary | ICD-10-CM | POA: Diagnosis not present

## 2016-05-22 DIAGNOSIS — K819 Cholecystitis, unspecified: Secondary | ICD-10-CM | POA: Diagnosis not present

## 2016-05-22 DIAGNOSIS — J449 Chronic obstructive pulmonary disease, unspecified: Secondary | ICD-10-CM | POA: Diagnosis not present

## 2016-05-22 DIAGNOSIS — E119 Type 2 diabetes mellitus without complications: Secondary | ICD-10-CM | POA: Diagnosis not present

## 2016-05-22 DIAGNOSIS — F1420 Cocaine dependence, uncomplicated: Secondary | ICD-10-CM | POA: Diagnosis not present

## 2016-05-22 DIAGNOSIS — F3164 Bipolar disorder, current episode mixed, severe, with psychotic features: Secondary | ICD-10-CM | POA: Diagnosis not present

## 2016-05-22 DIAGNOSIS — N189 Chronic kidney disease, unspecified: Secondary | ICD-10-CM | POA: Diagnosis not present

## 2016-05-22 NOTE — Patient Outreach (Signed)
[facility]) Care Management  [place]   05/22/2016  Tiffany [name] 01-02-1961 [id number]  Subjective: Patient was called to follow up after her recent hospitalization. HIPAA identifiers were obtained.  Patient is a 56 year old female with multiple medical conditions including but not limited to: CHF, hypertension, COPD, type 2 diabetes, hyperlipidemia, pulmonary hypertension, and history of tobacco and narcotic abuse. Patient was hospitalized 04/10/16-04/15/16 for CHF exacerbation, 04/21/16 for hypokalemia, and 05/09/16 for cholecystitis.  Patient reported that she felt "fine" today.  She said she was able to manage her medications.  When the discharge summary and physician notes were reviewed with the patient, she displayed some confusion about the Potassium dose.  Dr. Libby Maw office was called for clarification.   Objective:   Encounter Medications: Outpatient Encounter Prescriptions as of 05/22/2016  Medication Sig  . acetaminophen (TYLENOL 8 HOUR) 650 MG CR tablet Take 1 tablet (650 mg total) by mouth every 8 (eight) hours as needed for pain.  [name] albuterol (PROVENTIL) (2.5 MG/3ML) 0.083% nebulizer solution Take 3 mLs (2.5 mg total) by nebulization every 6 (six) hours as needed for wheezing or shortness of breath.  . Alcohol Swabs (B-D SINGLE USE SWABS REGULAR) PADS CHECK CAPILLARY BLOOD GLUCOSE ONE TIME DAILY  . amLODipine (NORVASC) 5 MG tablet Take 1 tablet (5 mg total) by mouth daily.  [name] aspirin 81 MG EC tablet Take 1 tablet (81 mg total) by mouth daily. Swallow whole.  [name] atorvastatin (LIPITOR) 40 MG tablet Take 1 tablet (40 mg total) by mouth daily.  . benztropine (COGENTIN) 0.5 MG tablet Take 1 tablet (0.5 mg total) by mouth daily.  . Blood Glucose Monitoring Suppl (ACCU-CHEK AVIVA PLUS) w/Device KIT CHECK BLOOD SUGAR THREE TIMES DAILY  . BREO ELLIPTA 100-25 MCG/INH AEPB INHALE 1 PUFF EVERY DAY  . divalproex ([name] ER) 500 MG 24 hr tablet Take 1 tablet  (500 mg total) by mouth daily. (Patient taking differently: Take 500 mg by mouth at bedtime. )  . escitalopram (LEXAPRO) 20 MG tablet Take 1 tablet (20 mg total) by mouth daily.  . furosemide (LASIX) 40 MG tablet Take 2 tablets (80 mg total) by mouth 2 (two) times daily.  [name] gabapentin (NEURONTIN) 600 MG tablet Take 1 tablet (600 mg total) by mouth 3 (three) times daily.  [name] glucose blood test strip Use to check blood sugar 3 times daily.  [name] KLOR-CON M20 20 MEQ tablet Take 2 tablets by mouth 2 (two) times daily.  . metFORMIN (GLUCOPHAGE-XR) 500 MG 24 hr tablet TAKE 1 TABLET TWICE DAILY  . mirtazapine (REMERON) 30 MG tablet Take 1 tablet (30 mg total) by mouth at bedtime.  . montelukast (SINGULAIR) 10 MG tablet TAKE 1 TABLET AT BEDTIME  . Multiple Vitamins-Minerals (MULTIVITAMIN WITH MINERALS) tablet Take 1 tablet by mouth daily. Reported on 07/28/2015  . naproxen (NAPROSYN) 500 MG tablet Take 500 mg by mouth daily.  . nicotine (EQL NICOTINE) 21 mg/24hr patch Place 21 mg onto the skin daily.  [name] omeprazole (PRILOSEC) 40 MG capsule Take 1 capsule (40 mg total) by mouth daily. (Patient taking differently: Take 40 mg by mouth 2 (two) times daily. )  . OXYGEN Inhale 6 L into the lungs daily.  [name] PRESCRIPTION MEDICATION Inhale into the lungs at bedtime. CPAP  . tiotropium (SPIRIVA HANDIHALER) 18 MCG inhalation capsule Place 1 capsule (18 mcg total) into inhaler and inhale daily.  . VENTOLIN HFA 108 (90 Base) MCG/ACT inhaler INHALE 2 PUFFS EVERY 6 HOURS  AS NEEDED  . ziprasidone (GEODON) 80 MG capsule Take 1 capsule (80 mg total) by mouth at bedtime.   No facility-administered encounter medications on file as of 05/22/2016.     Functional Status: In your present state of health, do you have any difficulty performing the following activities: 05/09/2016 04/25/2016  Hearing? N N  Vision? N N  Difficulty concentrating or making decisions? N N  Walking or climbing stairs? Y Y  Dressing or bathing? N Y  Doing  errands, shopping? Y N  Preparing Food and eating ? - N  Using the Toilet? - N  In the past six months, have you accidently leaked urine? - N  Do you have problems with loss of bowel control? - N  Managing your Medications? - Y  Managing your Finances? - Y  Housekeeping or managing your Housekeeping? - Y  Some recent data might be hidden    Fall/Depression Screening: Fall Risk  04/25/2016 04/17/2016 04/07/2016  Falls in the past year? No No No  Number falls in past yr: - - -  Injury with Fall? - - -  Risk Factor Category  - - -  Risk for fall due to : History of fall(s);Impaired mobility Impaired mobility -  Follow up - - -   PHQ 2/9 Scores 04/25/2016 04/19/2016 04/17/2016 04/07/2016 04/05/2016 02/18/2016 12/31/2015  PHQ - 2 Score '1 1 1 ' 0 1 1 0  PHQ- 9 Score - - - - - - -  Some encounter information is confidential and restricted. Go to Review Flowsheets activity to see all data.      Assessment:  Potassium was stopped at discharge.  Patient called her provider's office 05/17/16 with complaints of hand tingling.  As a result, Potassium was restarted 40Meq 1 tablet three times daily.  Labs were drawn and the patient had subsequent hyperkalemia, 5.70m/dl. Potassium was held for a few days and then restarted at 40Meq 1 tablet twice daily.  Unfortunately, with all the changes, the patient was confused about the potassium dose.  Dr. DLibby Mawoffice was called.    Dr. DLorenso Couriercalled me back personally and clarified the patient's potassium dose as KCl 40Meq twice daily.  Patient confirmed she had KCL 20MEq tablets in her possession.  She was instructed to take 2 tablets (40MEq) twice daily and to follow up with Dr. DLorenso Courierbefore the end of the week.  Plan:  Follow up with the patient next week to schedule a pharmacy home visit and investigate pill packaging.

## 2016-05-22 NOTE — Telephone Encounter (Signed)
Patient supposed to be taking potassium 40mEq twice daily as we discussed on Friday. She was advised to f/u with me early this week to repeat labrow

## 2016-05-22 NOTE — Telephone Encounter (Signed)
Tiffany [name] needs verbal orders for a medical social worker, help with financial issues. Please call [phone] ext. [phone] ep

## 2016-05-22 NOTE — Telephone Encounter (Signed)
She should be following up with me early this week to repeat lab work to make sure this is an appropriate dose.  Tiffany [name] voiced understanding and appreciation for my calls. No other concerns from her.  Archie Patten, MD [facility] Resident  05/22/2016, 1:14 PM

## 2016-05-22 NOTE — Telephone Encounter (Signed)
Called Alice back.  Verbal order given for medical social worker.  Archie Patten, MD [facility] Family Medicine Resident  05/22/2016, 1:21 PM

## 2016-05-22 NOTE — Telephone Encounter (Signed)
Pharmacist with [facility] calling with questions regarding patients potassium. On most recently discharge summary potassium was not listed but according to MD's last office note patient should be taking. Patient herself is unclear whether she should be taking this medication or not. Alwyn Ren would like clarification on status of this medication. Will forward to PCP.

## 2016-05-23 ENCOUNTER — Ambulatory Visit (INDEPENDENT_AMBULATORY_CARE_PROVIDER_SITE_OTHER): Payer: Medicare HMO | Admitting: Psychiatry

## 2016-05-23 ENCOUNTER — Encounter (HOSPITAL_COMMUNITY): Payer: Self-pay | Admitting: Psychiatry

## 2016-05-23 DIAGNOSIS — Z818 Family history of other mental and behavioral disorders: Secondary | ICD-10-CM

## 2016-05-23 DIAGNOSIS — Z87891 Personal history of nicotine dependence: Secondary | ICD-10-CM | POA: Diagnosis not present

## 2016-05-23 DIAGNOSIS — F1221 Cannabis dependence, in remission: Secondary | ICD-10-CM | POA: Diagnosis not present

## 2016-05-23 DIAGNOSIS — F200 Paranoid schizophrenia: Secondary | ICD-10-CM | POA: Diagnosis not present

## 2016-05-23 DIAGNOSIS — F411 Generalized anxiety disorder: Secondary | ICD-10-CM

## 2016-05-23 MED ORDER — ZIPRASIDONE HCL 80 MG PO CAPS: 80.0000 mg | ORAL_CAPSULE | Freq: Every day | ORAL | 1 refills | Status: DC

## 2016-05-23 MED ORDER — ESCITALOPRAM OXALATE 20 MG PO TABS
20.0000 mg | ORAL_TABLET | Freq: Every day | ORAL | 1 refills | Status: DC
Start: 2016-05-23 — End: 2016-06-29

## 2016-05-23 MED ORDER — BENZTROPINE MESYLATE 0.5 MG PO TABS: 0.5000 mg | ORAL_TABLET | Freq: Every day | ORAL | 1 refills | Status: DC

## 2016-05-23 MED ORDER — MIRTAZAPINE 30 MG PO TABS: 30.0000 mg | ORAL_TABLET | Freq: Every day | ORAL | 1 refills | Status: DC

## 2016-05-23 MED ORDER — DIVALPROEX SODIUM ER 500 MG PO TB24: 1000.0000 mg | ORAL_TABLET | Freq: Every day | ORAL | 1 refills | Status: DC

## 2016-05-23 NOTE — Progress Notes (Signed)
BH MD/PA/NP OP Progress Note  05/23/2016 2:48 PM Tiffany [name]  MRN:  [id number]  Chief Complaint:  Subjective:  I am not doing good.  I am having a lot of irritability, poor sleep and anger issues.  HPI: Tiffany [name] came for her follow-up appointment.  She was recently admitted on a medical floor due to hypoxia, weakness, hypokalemia and acute renal failure.  [name] getting slowly better but she continues to have irritability and mood swing.  She does not get along with her son.  Her cousin moved out but her son is living with his friends.  Now her sister wants to move in but she is not sure because she does not want to many people in the house.  She continues to endorse some time paranoia but denies any hallucination or any suicidal thoughts.  Patient has multiple health issues.  Her last [name] level was 18.  Patient told that she is taking the [name] regularly.  Patient has no tremors or shakes.  She has no rash or itching.  She used to take higher dose of Geodon however it was reduced due to prolonged QTC.  Patient does not want to stop Geodon.  She feels proud that she's been sober from drugs for past few months.  She also stopped smoking.  She continues to have some time shortness of breath and she is using oxygen.  [name] seeing Tiffany [name] for counseling.  Visit Diagnosis:    ICD-9-CM ICD-10-CM   1. Paranoid schizophrenia ([place]) 295.30 F20.0 mirtazapine (REMERON) 30 MG tablet     divalproex ([name] ER) 500 MG 24 hr tablet     ziprasidone (GEODON) 80 MG capsule     escitalopram (LEXAPRO) 20 MG tablet     benztropine (COGENTIN) 0.5 MG tablet    Past Psychiatric History: Reviewed. Patient has multiple psychiatric hospitalization due to relapse into drug use and mental illness. Her last psychiatric hospitalization was in November 2017 at [facility]. She remember having psychiatric illness started in 1984-03-12 when her daughter died due to cancer. She had tried Abilify, Risperdal ,  Wellbutrin , Xanax , Klonopin, trazodone, BuSpar in the past. She was seeing psychiatrist at [facility] . Patient denies any history of suicidal attempt but reported history of suicidal thoughts. She had history of paranoia, delusion, anxiety and manic-like symptoms. In the past she was high dose of geodon but reduced due to increase QTC.  Past Medical History:  Past Medical History:  Diagnosis Date  . Anxiety   . Arthritis    "all over" (04/10/2016)  . Asthma   . CHF (congestive heart failure) ([place])   . Chronic bronchitis ([place])   . Chronic kidney disease    "I see a kidney dr." (04/10/2016)  . Cocaine abuse   . Complication of anesthesia    decreased bp, decreased heart rate  . Depression   . Disorder of nervous system   . Emphysema   . GERD (gastroesophageal reflux disease)   . Heart attack ([place]) 1980s  . History of blood transfusion 1994   "couldn't stop bleeding from my period"  . Hyperlipidemia LDL goal <70   . Hypertension   . Incontinence   . Manic depression ([place])   . On home oxygen therapy    "6L; 24/7" (04/10/2016)  . OSA on CPAP    "wear mask sometimes" (04/10/2016)  . Paranoid ([place])    "sometimes; I'm on RX for it" (04/10/2016)  . Pneumonia    "I've had  it several times; haven't had it since 06/2015" (04/10/2016)  . Schizophrenia ([place])   . Seasonal allergies   . Seizures ([place])    "don't know what kind; last one was ~ 1 yr ago" (04/10/2016)  . Sinus trouble   . Stroke [facility]) 1980s   denies residual on 04/10/2016  . Type II diabetes mellitus ([place])     Past Surgical History:  Procedure Laterality Date  . CESAREAN SECTION  1997  . HERNIA REPAIR    . IR PERC CHOLECYSTOSTOMY  05/10/2016  . UMBILICAL HERNIA REPAIR  ~ 1963   "that's why I don't have a belly button"  . VAGINAL HYSTERECTOMY      Family Psychiatric History: Reviewed.  Family History:  Family History  Problem Relation Age of Onset  . Cancer Father     prostate  . Cancer Mother     lung  .  Depression Mother   . Depression Sister   . Anxiety disorder Sister   . Schizophrenia Sister   . Bipolar disorder Sister   . Depression Sister   . Depression Brother   . Heart failure      cousin    Social History:  Social History   Social History  . Marital status: Widowed    Spouse name: N/A  . Number of children: 3  . Years of education: N/A   Occupational History  . disabled     factory production   Social History Main Topics  . Smoking status: Former Smoker    Packs/day: 1.50    Years: 38.00    Types: Cigarettes    Start date: 03/13/1977  . Smokeless tobacco: Never Used  . Alcohol use No  . Drug use: Yes    Types: Cocaine     Comment: 04/10/2016 "last used cocaine back in November 2017"  . Sexual activity: No   Other Topics Concern  . Not on file   Social History Narrative   Has 1 son, Tiffany [name]   Lives with son and his boyfriend   Her house has ramps and handrails should she ever needs them.    Her mother lives down the street from her and is a good support person in addition to her son.   She drives herself, has private transportation.    Cocaine free since 02/24/16, smoke free since 04/10/16       Allergies:  Allergies  Allergen Reactions  . Hydroxyzine Shortness Of Breath    -throat closed up  . Latuda [Lurasidone Hcl] Anaphylaxis  . Codeine Nausea And Vomiting  . Sulfa Antibiotics Itching    Metabolic Disorder Labs: Lab Results  Component Value Date   HGBA1C 5.8 (H) 04/10/2016   MPG 120 04/10/2016   MPG 114 11/25/2015   No results found for: PROLACTIN Lab Results  Component Value Date   CHOL 135 12/04/2015   TRIG 90 12/04/2015   HDL 55 12/04/2015   CHOLHDL 2.5 12/04/2015   VLDL 18 12/04/2015   LDLCALC 62 12/04/2015   LDLCALC 54 07/15/2013     Current Medications: Current Outpatient Prescriptions  Medication Sig Dispense Refill  . acetaminophen (TYLENOL 8 HOUR) 650 MG CR tablet Take 1 tablet (650 mg total) by mouth every 8 (eight)  hours as needed for pain. 60 tablet 0  . albuterol (PROVENTIL) (2.5 MG/3ML) 0.083% nebulizer solution Take 3 mLs (2.5 mg total) by nebulization every 6 (six) hours as needed for wheezing or shortness of breath. 150 mL 1  . Alcohol Swabs (  B-D SINGLE USE SWABS REGULAR) PADS CHECK CAPILLARY BLOOD GLUCOSE ONE TIME DAILY 100 each 3  . amLODipine (NORVASC) 5 MG tablet Take 1 tablet (5 mg total) by mouth daily. 90 tablet 0  . aspirin 81 MG EC tablet Take 1 tablet (81 mg total) by mouth daily. Swallow whole. 30 tablet 1  . atorvastatin (LIPITOR) 40 MG tablet Take 1 tablet (40 mg total) by mouth daily. 30 tablet 1  . benztropine (COGENTIN) 0.5 MG tablet Take 1 tablet (0.5 mg total) by mouth daily. 30 tablet 2  . Blood Glucose Monitoring Suppl (ACCU-CHEK AVIVA PLUS) w/Device KIT CHECK BLOOD SUGAR THREE TIMES DAILY 1 kit 0  . BREO ELLIPTA 100-25 MCG/INH AEPB INHALE 1 PUFF EVERY DAY 180 each 0  . divalproex ([name] ER) 500 MG 24 hr tablet Take 1 tablet (500 mg total) by mouth daily. (Patient taking differently: Take 500 mg by mouth at bedtime. ) 30 tablet 2  . escitalopram (LEXAPRO) 20 MG tablet Take 1 tablet (20 mg total) by mouth daily. 30 tablet 2  . furosemide (LASIX) 40 MG tablet Take 2 tablets (80 mg total) by mouth 2 (two) times daily. 120 tablet 0  . gabapentin (NEURONTIN) 600 MG tablet Take 1 tablet (600 mg total) by mouth 3 (three) times daily. 90 tablet 1  . glucose blood test strip Use to check blood sugar 3 times daily. 100 each 12  . KLOR-CON M20 20 MEQ tablet Take 2 tablets by mouth 2 (two) times daily.    . metFORMIN (GLUCOPHAGE-XR) 500 MG 24 hr tablet TAKE 1 TABLET TWICE DAILY 180 tablet 0  . mirtazapine (REMERON) 30 MG tablet Take 1 tablet (30 mg total) by mouth at bedtime. 30 tablet 2  . montelukast (SINGULAIR) 10 MG tablet TAKE 1 TABLET AT BEDTIME 90 tablet 3  . Multiple Vitamins-Minerals (MULTIVITAMIN WITH MINERALS) tablet Take 1 tablet by mouth daily. Reported on 07/28/2015 30 tablet 1   . naproxen (NAPROSYN) 500 MG tablet Take 500 mg by mouth daily.    . nicotine (EQL NICOTINE) 21 mg/24hr patch Place 21 mg onto the skin daily.    [name] omeprazole (PRILOSEC) 40 MG capsule Take 1 capsule (40 mg total) by mouth daily. (Patient taking differently: Take 40 mg by mouth 2 (two) times daily. ) 30 capsule 1  . OXYGEN Inhale 6 L into the lungs daily.    [name] PRESCRIPTION MEDICATION Inhale into the lungs at bedtime. CPAP    . tiotropium (SPIRIVA HANDIHALER) 18 MCG inhalation capsule Place 1 capsule (18 mcg total) into inhaler and inhale daily. 90 capsule 1  . VENTOLIN HFA 108 (90 Base) MCG/ACT inhaler INHALE 2 PUFFS EVERY 6 HOURS AS NEEDED 18 g 6  . ziprasidone (GEODON) 80 MG capsule Take 1 capsule (80 mg total) by mouth at bedtime. 30 capsule 2   No current facility-administered medications for this visit.     Neurologic: Headache: No Seizure: No Paresthesias: Neuropathy pain  Musculoskeletal: Strength & Muscle Tone: within normal limits Gait & Station: normal Patient leans: N/A  Psychiatric Specialty Exam: Review of Systems  Constitutional: Positive for malaise/fatigue.  HENT: Negative.   Respiratory: Positive for shortness of breath.   Musculoskeletal: Positive for back pain and joint pain.  Skin: Negative for rash.  Neurological: Positive for tingling.  Psychiatric/Behavioral: The patient has insomnia.     Blood pressure 134/78, pulse (!) 101, height _0  (1.626 m), weight 298 lb 9.6 oz (135.4 kg).Body mass index is 51.25 kg/m.  General  Appearance: Casual  Eye Contact:  Good  Speech:  Slow  Volume:  Normal  Mood:  Anxious and Irritable  Affect:  Labile  Thought Process:  Goal Directed  Orientation:  Full (Time, Place, and Person)  Thought Content: Logical, Paranoid Ideation and Rumination   Suicidal Thoughts:  No  Homicidal Thoughts:  No  Memory:  Immediate;   Good Recent;   Good Remote;   Good  Judgement:  Good  Insight:  Good  Psychomotor Activity:  Normal   Concentration:  Concentration: Fair and Attention Span: Fair  Recall:  [place] of Knowledge: Good  Language: Good  Akathisia:  No  Handed:  Right  AIMS (if indicated):  0  Assets:  Communication Skills Desire for Improvement Housing Resilience  ADL's:  Intact  Cognition: WNL  Sleep:  Fair     Assessment: Schizophrenia chronic paranoid type.  Generalized anxiety disorder.  Cocaine abuse in partial remission  Plan: I review records from other provider and recent discharge summary and recent blood work results.  Her creatinine is 1.58.  I recommended to see a nephrologist.  Patient has residual irritability, paranoia and mood swing.  Her last [name] level was 18.  Recommended to increase [name] 1000 mg at bedtime.  Continue Remeron 30 mg daily, Lexapro 20 mg daily, Geodon 80 mg at bedtime and Cogentin 0.5 mg at bedtime.  Encouraged to see Tiffany [name] for counseling.  Recommended to call us back if she has any question or any concern.  Discuss safety plan that anytime having active suicidal thoughts or homicidal thoughts and she need to call 911 for little local emergency room.  Follow-up in 2 months.  Mickie Kozikowski T., MD 05/23/2016, 2:48 PM

## 2016-05-24 ENCOUNTER — Ambulatory Visit (HOSPITAL_COMMUNITY)
Admission: RE | Admit: 2016-05-24 | Discharge: 2016-05-24 | Disposition: A | Discharge status: Home / Self Care | Payer: Medicare HMO | Source: Ambulatory Visit | Attending: Family Medicine | Admitting: Family Medicine

## 2016-05-24 ENCOUNTER — Encounter: Payer: Self-pay | Admitting: Family Medicine

## 2016-05-24 ENCOUNTER — Ambulatory Visit (INDEPENDENT_AMBULATORY_CARE_PROVIDER_SITE_OTHER): Payer: Medicare HMO | Admitting: Family Medicine

## 2016-05-24 VITALS — BP 121/65 | HR 93 | Temp 98.6°F | Ht 62.0 in | Wt 298.0 lb

## 2016-05-24 DIAGNOSIS — R062 Wheezing: Secondary | ICD-10-CM | POA: Diagnosis not present

## 2016-05-24 DIAGNOSIS — Z4682 Encounter for fitting and adjustment of non-vascular catheter: Secondary | ICD-10-CM | POA: Diagnosis not present

## 2016-05-24 DIAGNOSIS — R0602 Shortness of breath: Secondary | ICD-10-CM | POA: Diagnosis present

## 2016-05-24 DIAGNOSIS — I130 Hypertensive heart and chronic kidney disease with heart failure and stage 1 through stage 4 chronic kidney disease, or unspecified chronic kidney disease: Secondary | ICD-10-CM | POA: Diagnosis not present

## 2016-05-24 DIAGNOSIS — I5032 Chronic diastolic (congestive) heart failure: Secondary | ICD-10-CM | POA: Diagnosis not present

## 2016-05-24 DIAGNOSIS — E119 Type 2 diabetes mellitus without complications: Secondary | ICD-10-CM | POA: Diagnosis not present

## 2016-05-24 DIAGNOSIS — J440 Chronic obstructive pulmonary disease with acute lower respiratory infection: Secondary | ICD-10-CM | POA: Diagnosis not present

## 2016-05-24 DIAGNOSIS — I5030 Unspecified diastolic (congestive) heart failure: Secondary | ICD-10-CM | POA: Diagnosis not present

## 2016-05-24 DIAGNOSIS — K819 Cholecystitis, unspecified: Secondary | ICD-10-CM | POA: Diagnosis not present

## 2016-05-24 DIAGNOSIS — F3164 Bipolar disorder, current episode mixed, severe, with psychotic features: Secondary | ICD-10-CM | POA: Diagnosis not present

## 2016-05-24 DIAGNOSIS — J189 Pneumonia, unspecified organism: Secondary | ICD-10-CM | POA: Diagnosis not present

## 2016-05-24 DIAGNOSIS — N189 Chronic kidney disease, unspecified: Secondary | ICD-10-CM | POA: Diagnosis not present

## 2016-05-24 DIAGNOSIS — G4733 Obstructive sleep apnea (adult) (pediatric): Secondary | ICD-10-CM | POA: Diagnosis not present

## 2016-05-24 DIAGNOSIS — I10 Essential (primary) hypertension: Secondary | ICD-10-CM | POA: Diagnosis not present

## 2016-05-24 DIAGNOSIS — J449 Chronic obstructive pulmonary disease, unspecified: Secondary | ICD-10-CM | POA: Diagnosis not present

## 2016-05-24 DIAGNOSIS — F1420 Cocaine dependence, uncomplicated: Secondary | ICD-10-CM | POA: Diagnosis not present

## 2016-05-24 DIAGNOSIS — J45909 Unspecified asthma, uncomplicated: Secondary | ICD-10-CM | POA: Diagnosis not present

## 2016-05-24 NOTE — Patient Instructions (Addendum)
Take an additional dose of Lasix (2 tablets) tonight.  I will call you with the lab results tomorrow.  Keep tract of your weight  Please follow up in our clinic on Friday.

## 2016-05-24 NOTE — Progress Notes (Signed)
Subjective: CC: f/u hand cramping  HPI: Patient is a 56 y.o. female with a past medical history of CKD 2, diastolic heart failure, COPD, type 2 diabetes, bipolar disorder  presenting to clinic today for a same day appt for a f/u on her hand cramping but has concerns about her CHF.   Patient feels SOB and feels like she has some increased edema.  Feels it is more in her abdomen than in her LEs, but has noted it in the LE as well.  Feels LE edema is not as bad as it was prior to her other hospitalization.  O2 sat at home 89-93% on 6L [place].  Weight has gone up but she is not sure how reliable her scale is.  Weights at home: 300lbs yesterday 303lbs today; can't recall any other numbers.  Weighs self in the AM. She's going to get a better scale.   She's doing rehabilitation exercises which makes her short of breath (states it is with [facility]). Feels she may have more DOE recently. Notes SOB with lying supine as well. No longer smoking. Hasn't used albuterol as she hasn't had any wheezing.   Still having hand cramping. Hands start to shake and she drops things. Sometimes she has weakness and cramps with it. She had another episode since we last saw each other and noted it was not K. No other neurologic symptoms. No cramping or droppings things over the last few days.    Doing well since hospital d/c, no RUQ pain. Drain still putting out serosanguinous drainage.    Social History: still smoke free!   ROS: All other systems reviewed and are negative.  Past Medical History Patient Active Problem List   Diagnosis Date Noted  . Gallstones   . Upper abdominal pain   . Cholecystitis 05/09/2016  . QT prolongation   . OSA and COPD overlap syndrome ([place])   . Generalized weakness   . Arthritis   . Bipolar I disorder ([place])   . Hypokalemia 04/21/2016  . Wheezing 04/12/2016  . (HFpEF) heart failure with preserved ejection fraction ([place])   . Benign essential HTN 03/22/2016  . Rectal  bleeding 12/31/2015  . Bipolar affective disorder, mixed, severe, with psychotic behavior ([place]) 11/28/2015  . Cocaine use disorder, severe, dependence ([place]) 11/28/2015  . Hyponatremia 11/25/2015  . AKI (acute kidney injury) ([place]) 11/24/2015  . Blood in sputum   . Chronic diastolic heart failure ([place])   . SOB (shortness of breath)   . Acute on chronic respiratory failure with hypoxia and hypercapnia ([place]) 06/22/2015  . Onychomycosis 01/21/2015  . Nasal congestion 12/17/2014  . Tobacco use disorder 07/22/2014  . COPD (chronic obstructive pulmonary disease) ([place]) 07/08/2014  . Seborrheic keratoses 12/31/2013  . Knee pain, bilateral 01/22/2013  . Seizure ([place]) 01/04/2013  . Health care maintenance 11/25/2012  . Folliculitis 73/66/8159  . History of kidney stones 06/18/2012  . Chronic pain syndrome 06/18/2012  . Muscle spasm 06/18/2012  . Elevated troponin 04/28/2012  . Allergic reaction 04/07/2012  . Itching 09/06/2011  . HSV infection 08/30/2011  . Dyslipidemia 04/24/2011  . Anemia 04/24/2011  . Leukocytosis 04/24/2011  . Diabetic neuropathy ([place]) 04/24/2011  . Obstructive sleep apnea 10/18/2010  . Asthma 10/18/2010  . Morbid obesity ([place]) 10/18/2010  . Type 2 diabetes mellitus ([place]) 10/18/2010    Medications- reviewed and updated Current Outpatient Prescriptions  Medication Sig Dispense Refill  . acetaminophen (TYLENOL 8 HOUR) 650 MG CR tablet Take 1 tablet (650 mg  total) by mouth every 8 (eight) hours as needed for pain. 60 tablet 0  . albuterol (PROVENTIL) (2.5 MG/3ML) 0.083% nebulizer solution Take 3 mLs (2.5 mg total) by nebulization every 6 (six) hours as needed for wheezing or shortness of breath. 150 mL 1  . Alcohol Swabs (B-D SINGLE USE SWABS REGULAR) PADS CHECK CAPILLARY BLOOD GLUCOSE ONE TIME DAILY 100 each 3  . amLODipine (NORVASC) 5 MG tablet Take 1 tablet (5 mg total) by mouth daily. 90 tablet 0  . aspirin 81 MG EC tablet Take 1 tablet (81 mg total) by mouth  daily. Swallow whole. 30 tablet 1  . atorvastatin (LIPITOR) 40 MG tablet Take 1 tablet (40 mg total) by mouth daily. 30 tablet 1  . benztropine (COGENTIN) 0.5 MG tablet Take 1 tablet (0.5 mg total) by mouth daily. 30 tablet 1  . Blood Glucose Monitoring Suppl (ACCU-CHEK AVIVA PLUS) w/Device KIT CHECK BLOOD SUGAR THREE TIMES DAILY 1 kit 0  . BREO ELLIPTA 100-25 MCG/INH AEPB INHALE 1 PUFF EVERY DAY 180 each 0  . divalproex ([name] ER) 500 MG 24 hr tablet Take 2 tablets (1,000 mg total) by mouth at bedtime. 60 tablet 1  . escitalopram (LEXAPRO) 20 MG tablet Take 1 tablet (20 mg total) by mouth daily. 30 tablet 1  . furosemide (LASIX) 40 MG tablet Take 2 tablets (80 mg total) by mouth 2 (two) times daily. 120 tablet 0  . gabapentin (NEURONTIN) 600 MG tablet Take 1 tablet (600 mg total) by mouth 3 (three) times daily. 90 tablet 1  . glucose blood test strip Use to check blood sugar 3 times daily. 100 each 12  . KLOR-CON M20 20 MEQ tablet Take 2 tablets by mouth 2 (two) times daily.    . metFORMIN (GLUCOPHAGE-XR) 500 MG 24 hr tablet TAKE 1 TABLET TWICE DAILY 180 tablet 0  . mirtazapine (REMERON) 30 MG tablet Take 1 tablet (30 mg total) by mouth at bedtime. 30 tablet 1  . montelukast (SINGULAIR) 10 MG tablet TAKE 1 TABLET AT BEDTIME 90 tablet 3  . Multiple Vitamins-Minerals (MULTIVITAMIN WITH MINERALS) tablet Take 1 tablet by mouth daily. Reported on 07/28/2015 30 tablet 1  . naproxen (NAPROSYN) 500 MG tablet Take 500 mg by mouth daily.    [name] omeprazole (PRILOSEC) 40 MG capsule Take 1 capsule (40 mg total) by mouth daily. (Patient taking differently: Take 40 mg by mouth 2 (two) times daily. ) 30 capsule 1  . OXYGEN Inhale 6 L into the lungs daily.    . potassium chloride 20 MEQ TBCR Take 40 mEq by mouth 2 (two) times daily. 60 tablet 0  . tiotropium (SPIRIVA HANDIHALER) 18 MCG inhalation capsule Place 1 capsule (18 mcg total) into inhaler and inhale daily. 90 capsule 1  . VENTOLIN HFA 108 (90 Base)  MCG/ACT inhaler INHALE 2 PUFFS EVERY 6 HOURS AS NEEDED 18 g 6  . ziprasidone (GEODON) 80 MG capsule Take 1 capsule (80 mg total) by mouth at bedtime. 30 capsule 1   No current facility-administered medications for this visit.     Objective: Office vital signs reviewed. BP 121/65   Pulse 93   Temp 98.6 F (37 C) (Oral)   Ht '5\' 2"'  (1.575 m)   Wt 298 lb (135.2 kg)   SpO2 91%   BMI 54.50 kg/m  05/17/16:  292  Physical Examination:  General: Awake, alert, well- nourished, NAD Cardio: RRR, no m/r/g noted. Trace-1+ pitting edema. Difficult to assess JVD.  Pulm: No increased  WOB.  CTAB, without wheezes, rhonchi or crackles noted. On 6L [place].  MSK: normal to inspection with no tremor. Good hand grip/symmetric. Normal DRTs. No clonus. No spasticity noted.  Neuro: A&O x4. Speech clear. EOMI, Uvula and tongue midline. Facial movements symmetric. 5/5 strength in the upper extremities and lower extremities bilaterally. Sensation intact bilaterally. Normal DTRs.    Assessment/Plan:    Orders Placed This Encounter  Procedures  . DG Chest 2 View    Standing Status:   Future    Number of Occurrences:   1    Standing Expiration Date:   07/24/2017    Order Specific Question:   Reason for Exam (SYMPTOM  OR DIAGNOSIS REQUIRED)    Answer:   SOB, LE edema    Order Specific Question:   Is patient pregnant?    Answer:   No    Order Specific Question:   Preferred imaging location?    Answer:   [facility]    Order Specific Question:   Radiology Contrast Protocol - do NOT remove file path    Answer:   [email or web address]  . Basic Metabolic Panel    No orders of the defined types were placed in this encounter.   Archie Patten PGY-3, [place]

## 2016-05-25 ENCOUNTER — Other Ambulatory Visit: Payer: Self-pay | Admitting: *Deleted

## 2016-05-25 ENCOUNTER — Telehealth: Payer: Self-pay | Admitting: Family Medicine

## 2016-05-25 ENCOUNTER — Ambulatory Visit: Payer: Self-pay | Admitting: *Deleted

## 2016-05-25 DIAGNOSIS — N189 Chronic kidney disease, unspecified: Secondary | ICD-10-CM | POA: Diagnosis not present

## 2016-05-25 DIAGNOSIS — F1420 Cocaine dependence, uncomplicated: Secondary | ICD-10-CM | POA: Diagnosis not present

## 2016-05-25 DIAGNOSIS — I130 Hypertensive heart and chronic kidney disease with heart failure and stage 1 through stage 4 chronic kidney disease, or unspecified chronic kidney disease: Secondary | ICD-10-CM | POA: Diagnosis not present

## 2016-05-25 DIAGNOSIS — I5032 Chronic diastolic (congestive) heart failure: Secondary | ICD-10-CM | POA: Diagnosis not present

## 2016-05-25 DIAGNOSIS — F3164 Bipolar disorder, current episode mixed, severe, with psychotic features: Secondary | ICD-10-CM | POA: Diagnosis not present

## 2016-05-25 DIAGNOSIS — E119 Type 2 diabetes mellitus without complications: Secondary | ICD-10-CM | POA: Diagnosis not present

## 2016-05-25 DIAGNOSIS — K819 Cholecystitis, unspecified: Secondary | ICD-10-CM | POA: Diagnosis not present

## 2016-05-25 DIAGNOSIS — Z4682 Encounter for fitting and adjustment of non-vascular catheter: Secondary | ICD-10-CM | POA: Diagnosis not present

## 2016-05-25 DIAGNOSIS — J449 Chronic obstructive pulmonary disease, unspecified: Secondary | ICD-10-CM | POA: Diagnosis not present

## 2016-05-25 LAB — BASIC METABOLIC PANEL
BUN/Creatinine Ratio: 17 (ref 9–23)
BUN: 22 mg/dL (ref 6–24)
CO2: 32 mmol/L — ABNORMAL HIGH (ref 18–29)
Calcium: 9.4 mg/dL (ref 8.7–10.2)
Chloride: 99 mmol/L (ref 96–106)
Creatinine, Ser: 1.26 mg/dL — ABNORMAL HIGH (ref 0.57–1.00)
GFR calc Af Amer: 55 mL/min/{1.73_m2} — ABNORMAL LOW (ref >59)
GFR calc non Af Amer: 47 mL/min/{1.73_m2} — ABNORMAL LOW (ref >59)
Glucose: 122 mg/dL — ABNORMAL HIGH (ref 65–99)
Potassium: 4.6 mmol/L (ref 3.5–5.2)
Sodium: 147 mmol/L — ABNORMAL HIGH (ref 134–144)

## 2016-05-25 MED ORDER — POTASSIUM CHLORIDE ER 20 MEQ PO TBCR: 40.0000 meq | EXTENDED_RELEASE_TABLET | Freq: Two times a day (BID) | ORAL | 0 refills | Status: DC

## 2016-05-25 MED ORDER — FUROSEMIDE 40 MG PO TABS: 80.0000 mg | ORAL_TABLET | Freq: Two times a day (BID) | ORAL | 0 refills | Status: DC

## 2016-05-25 NOTE — Telephone Encounter (Signed)
Called pt back. Feels she's more swollen and out of breath today. Feels it is in mostly in her abdomen.  States no SOB at rest. Some on DOE and SOB with lying supine.  O2 sats still 89-93%.   CXR without volume overload.  Plan:  Patient very complicated as her weight continues to increase and she endorses worsening breathing, however exam and imaging are not consistent with volume overload.  Take Lasix 2.5 (100mg ) tablets this afternoon.  Take a 3rd dose of Lasix 2 tablets (80mg ) this evening.   Continue potassium 85meQ BID as her K was 4.6  Pt advised to keep f/u in our clinic tomorrow; this will be forwarded to the provider the patient is scheduled to see.   Archie Patten, MD [facility] Resident  05/25/2016, 12:00 PM

## 2016-05-25 NOTE — Assessment & Plan Note (Signed)
Concern for volume overload given increase in weight. Unfortunately pt had labwork prior to our appt to follow up on her potassium and the lab is closed currently so I cannot check a BNP- I'd expect it to be falsely low anyways given her obesity). Lungs clear, however pt's presentation was abnormal with her last CHF exacerbation and I have no reason for her weight gain (6 lbs in 1 week).  - will treat as chf exacerbation  - increase Lasix to 80mg  TID (instead of BID) - continue to monitor weights, O2 saturations at home - continue potassium 62mEQ BID (never discontinued after last hospitalization despite their advice; I feel this is appropriate as her K was supplemented during that hospitalization). - BMET today - discussed hand cramping and dropping things with [facility], he felt this could be fluid shift leading to swelling in hands. -I will call to check on the pt given she's at high risk for hospitalization given multiple hospitalizations in the recent past - strict precautions discussed - pt to f/u on Friday.

## 2016-05-25 NOTE — Patient Outreach (Signed)
[place] [facility]) Care Management  05/25/2016  Tiffany [name] [birth year] [id number]  [name] was able to make contact with patient today to reschedule the initial home visit for [name] to assist patient with completion of Advanced Directives ([place] documents).  Home visit is scheduled for Thursday, May 17th at 3:00pm at patient's home. [name], BSW, MSW, LCSW  Licensed Education officer, [facility]  Mailing [place] [address], [place], [place] [address] Physical Address-[address], [place], [place] [address] Toll Free Main # [phone] Fax # [phone] Cell # [phone]  Office # [phone] [email or web address]

## 2016-05-26 ENCOUNTER — Other Ambulatory Visit: Payer: Self-pay | Admitting: *Deleted

## 2016-05-26 ENCOUNTER — Ambulatory Visit (INDEPENDENT_AMBULATORY_CARE_PROVIDER_SITE_OTHER): Payer: Medicare HMO | Admitting: Family Medicine

## 2016-05-26 ENCOUNTER — Encounter: Payer: Self-pay | Admitting: Family Medicine

## 2016-05-26 DIAGNOSIS — J449 Chronic obstructive pulmonary disease, unspecified: Secondary | ICD-10-CM | POA: Diagnosis not present

## 2016-05-26 NOTE — Patient Outreach (Signed)
[place] [facility]) Care Management  05/26/2016  Tiffany [name] 10-29-1960 [id number]   Weekly transition of care call placed to member, no answer.  HIPAA compliant voice message left.  Will await call back, if no call back, will follow up next week.  [name], [place], MSN [place] [phone]

## 2016-05-26 NOTE — Assessment & Plan Note (Signed)
Patient is here with chronic shortness of breath on supplemental O2 at baseline. No significant changes since last visit, but patient states that she is feeling well and at her baseline. There was concern for CHF exacerbation at the last visit, because of this Lasix was increased from twice a day to 3 times a day. Patient denied any noticeable change in urine output. Patient is currently back to taking Lasix twice a day. No increased swelling in ankles. No red flag symptoms today.  - Continue current dictation regimen. - No changes in medications today however would encourage consideration for increased Lasix dosing vs. initiation of metolazone vs. transition to torsemide (due to possible gut edema). - Patient to follow-up with PCP next week

## 2016-05-26 NOTE — Patient Instructions (Signed)
It was a pleasure seeing you today in our clinic. Today we discussed your shortness of breath. Here is the treatment plan we have discussed and agreed upon together:   - Continue taking your medications as prescribed. - I would like to have you follow-up with Dr. Lorenso Courier in 1 week. - Continue to monitor your daily weights.

## 2016-05-26 NOTE — Progress Notes (Signed)
   HPI  CC: Shortness of breath Patient states that she feels well overall. She continues to have shortness breath but this is at her baseline. She denies any changes from her last visit. She endorses good compliance with her medication regimen at this time. We discussed that she was taking Lasix 3 times a day as directed by Dr. Lorenso Courier from the last visit. She reduce this down to twice a day starting today. She states that she did not notice any increase in her urine output during the period in which she was taking Lasix more frequently.  She denies any recent headache, fever, chills, dizziness, syncope, nausea, vomiting, diarrhea, chest pain, abdominal pain, numbness, weakness, or paresthesias.  Review of Systems See HPI for ROS.   CC, SH/smoking status, and VS noted  Objective: BP 108/62   Pulse 93   Temp 98 F (36.7 C) (Oral)   Wt 299 lb (135.6 kg)   SpO2 94%   BMI 54.69 kg/m  Gen: NAD, alert, cooperative, and pleasant. Morbidly obese CV: RRR, no murmur [difficult due to body habitus] Resp: CTAB, no wheezes, non-labored, supplemental oxygen in place with [place]. Ext: 1+ bilat edema, warm Neuro: Alert and oriented, Speech clear, No gross deficits   Assessment and plan:  COPD (chronic obstructive pulmonary disease) ([place]) Patient is here with chronic shortness of breath on supplemental O2 at baseline. No significant changes since last visit, but patient states that she is feeling well and at her baseline. There was concern for CHF exacerbation at the last visit, because of this Lasix was increased from twice a day to 3 times a day. Patient denied any noticeable change in urine output. Patient is currently back to taking Lasix twice a day. No increased swelling in ankles. No red flag symptoms today.  - Continue current dictation regimen. - No changes in medications today however would encourage consideration for increased Lasix dosing vs. initiation of metolazone vs. transition to  torsemide (due to possible gut edema). - Patient to follow-up with PCP next week   Elberta Leatherwood, MD,MS,  PGY3 05/26/2016 5:11 PM

## 2016-05-29 ENCOUNTER — Ambulatory Visit (HOSPITAL_COMMUNITY): Payer: Self-pay | Admitting: Clinical

## 2016-05-30 ENCOUNTER — Other Ambulatory Visit: Payer: Self-pay | Admitting: Pharmacist

## 2016-05-30 ENCOUNTER — Other Ambulatory Visit: Payer: Self-pay | Admitting: Family Medicine

## 2016-05-30 ENCOUNTER — Emergency Department (HOSPITAL_COMMUNITY): Payer: Medicare HMO

## 2016-05-30 ENCOUNTER — Encounter (HOSPITAL_COMMUNITY): Payer: Self-pay | Admitting: Nurse Practitioner

## 2016-05-30 ENCOUNTER — Other Ambulatory Visit: Payer: Self-pay

## 2016-05-30 ENCOUNTER — Encounter: Payer: Medicare HMO | Admitting: Family Medicine

## 2016-05-30 ENCOUNTER — Emergency Department (HOSPITAL_COMMUNITY)
Admission: EM | Admit: 2016-05-30 | Discharge: 2016-05-30 | Disposition: A | Discharge status: Home / Self Care | Payer: Medicare HMO | Attending: Emergency Medicine | Admitting: Emergency Medicine

## 2016-05-30 ENCOUNTER — Telehealth: Payer: Self-pay | Admitting: *Deleted

## 2016-05-30 DIAGNOSIS — Z79899 Other long term (current) drug therapy: Secondary | ICD-10-CM | POA: Diagnosis not present

## 2016-05-30 DIAGNOSIS — T8189XA Other complications of procedures, not elsewhere classified, initial encounter: Secondary | ICD-10-CM | POA: Diagnosis not present

## 2016-05-30 DIAGNOSIS — R079 Chest pain, unspecified: Secondary | ICD-10-CM | POA: Diagnosis not present

## 2016-05-30 DIAGNOSIS — Z87891 Personal history of nicotine dependence: Secondary | ICD-10-CM | POA: Insufficient documentation

## 2016-05-30 DIAGNOSIS — Z7982 Long term (current) use of aspirin: Secondary | ICD-10-CM | POA: Diagnosis not present

## 2016-05-30 DIAGNOSIS — I130 Hypertensive heart and chronic kidney disease with heart failure and stage 1 through stage 4 chronic kidney disease, or unspecified chronic kidney disease: Secondary | ICD-10-CM | POA: Insufficient documentation

## 2016-05-30 DIAGNOSIS — Z7984 Long term (current) use of oral hypoglycemic drugs: Secondary | ICD-10-CM | POA: Diagnosis not present

## 2016-05-30 DIAGNOSIS — R0789 Other chest pain: Secondary | ICD-10-CM | POA: Insufficient documentation

## 2016-05-30 DIAGNOSIS — I509 Heart failure, unspecified: Secondary | ICD-10-CM | POA: Insufficient documentation

## 2016-05-30 DIAGNOSIS — Z5321 Procedure and treatment not carried out due to patient leaving prior to being seen by health care provider: Secondary | ICD-10-CM | POA: Diagnosis not present

## 2016-05-30 DIAGNOSIS — E1122 Type 2 diabetes mellitus with diabetic chronic kidney disease: Secondary | ICD-10-CM | POA: Insufficient documentation

## 2016-05-30 DIAGNOSIS — N189 Chronic kidney disease, unspecified: Secondary | ICD-10-CM | POA: Insufficient documentation

## 2016-05-30 DIAGNOSIS — Z8673 Personal history of transient ischemic attack (TIA), and cerebral infarction without residual deficits: Secondary | ICD-10-CM | POA: Insufficient documentation

## 2016-05-30 DIAGNOSIS — Y713 Surgical instruments, materials and cardiovascular devices (including sutures) associated with adverse incidents: Secondary | ICD-10-CM | POA: Diagnosis not present

## 2016-05-30 DIAGNOSIS — J45909 Unspecified asthma, uncomplicated: Secondary | ICD-10-CM | POA: Diagnosis not present

## 2016-05-30 LAB — BASIC METABOLIC PANEL
Anion gap: 7 (ref 5–15)
BUN: 14 mg/dL (ref 6–20)
CO2: 34 mmol/L — ABNORMAL HIGH (ref 22–32)
Calcium: 9.5 mg/dL (ref 8.9–10.3)
Chloride: 98 mmol/L — ABNORMAL LOW (ref 101–111)
Creatinine, Ser: 1.09 mg/dL — ABNORMAL HIGH (ref 0.44–1.00)
GFR calc Af Amer: >60 mL/min (ref >60)
GFR calc non Af Amer: 55 mL/min — ABNORMAL LOW (ref >60)
Glucose, Bld: 84 mg/dL (ref 65–99)
Potassium: 4 mmol/L (ref 3.5–5.1)
Sodium: 139 mmol/L (ref 135–145)

## 2016-05-30 LAB — CBC
HCT: 33.7 % — ABNORMAL LOW (ref 36.0–46.0)
Hemoglobin: 9.9 g/dL — ABNORMAL LOW (ref 12.0–15.0)
MCH: 27.7 pg (ref 26.0–34.0)
MCHC: 29.4 g/dL — ABNORMAL LOW (ref 30.0–36.0)
MCV: 94.4 fL (ref 78.0–100.0)
Platelets: 426 10*3/uL — ABNORMAL HIGH (ref 150–400)
RBC: 3.57 MIL/uL — ABNORMAL LOW (ref 3.87–5.11)
RDW: 16.3 % — ABNORMAL HIGH (ref 11.5–15.5)
WBC: 11.1 10*3/uL — ABNORMAL HIGH (ref 4.0–10.5)

## 2016-05-30 LAB — I-STAT TROPONIN, ED: Troponin i, poc: 0 ng/mL (ref 0.00–0.08)

## 2016-05-30 NOTE — ED Notes (Signed)
Called for room and no answer

## 2016-05-30 NOTE — ED Triage Notes (Addendum)
Pt presents with cp and post-op problem. Her symptoms began today. The chest pain began about 45 minutes ago while at rest. She felt a pressure in her chest and more SOB than normal. She also c/o Increased brown and pus colored drainage in her gallbladder drainage bag today

## 2016-05-30 NOTE — Patient Outreach (Signed)
[facility] [facility]) Care Management  05/30/2016  Tiffany [name] 1960-02-05 [id number]   Patient was called to follow up on medication management.  HIPAA identifiers were obtained.  Patient said she was in the car on her way to take her son to a doctor's appointment.  She did not report any issues but did ask that I review her appointments with her for the week.  Since she was in the car, we could not discuss her medications in depth but she said she was not having any issues.   A pharmacy home visit was scheduled for Tuesday Jun 06, 2016 at 12 noon.  Plan:  1. Complete pharmacy home visit.  2.  Consider pill packaging for the patient.   Elayne Guerin, PharmD, [place] Clinical Pharmacist [phone]

## 2016-05-30 NOTE — Telephone Encounter (Signed)
Pt was scheduled for an appointment with Dr. Kathaleen Grinder today. Patient reported that she was having a lot drainage, odor, strange color and pus coming from her cholecystomy bag.  Patient stated that is not normal for her.  Advised patient she should be seen, however there were no appointments at [facility] this afternoon.  Advised patient to go to ED for further evaluation.  Pt denied pain and unsure about fever.  Also advised patient to follow up with her home health agency, which she did not know.    Left voice message with [place], [place] [phone] ext [phone] to return nurse call at [facility].  Need to know if they are patient's home health agency and who is her home health nurse.  Hassell Done, Rosine Beat, RN

## 2016-05-30 NOTE — ED Notes (Signed)
Called for room x3 and no answer

## 2016-05-30 NOTE — Progress Notes (Signed)
Error   This encounter was created in error - please disregard. 

## 2016-05-31 ENCOUNTER — Encounter: Payer: Self-pay | Admitting: Family Medicine

## 2016-05-31 ENCOUNTER — Telehealth: Payer: Self-pay | Admitting: Family Medicine

## 2016-05-31 ENCOUNTER — Ambulatory Visit (INDEPENDENT_AMBULATORY_CARE_PROVIDER_SITE_OTHER): Payer: Medicare HMO | Admitting: Family Medicine

## 2016-05-31 VITALS — BP 120/60 | HR 84 | Temp 98.1°F | Ht 62.0 in | Wt 299.0 lb

## 2016-05-31 DIAGNOSIS — F1420 Cocaine dependence, uncomplicated: Secondary | ICD-10-CM | POA: Diagnosis not present

## 2016-05-31 DIAGNOSIS — Z4682 Encounter for fitting and adjustment of non-vascular catheter: Secondary | ICD-10-CM | POA: Diagnosis not present

## 2016-05-31 DIAGNOSIS — J449 Chronic obstructive pulmonary disease, unspecified: Secondary | ICD-10-CM | POA: Diagnosis not present

## 2016-05-31 DIAGNOSIS — F3164 Bipolar disorder, current episode mixed, severe, with psychotic features: Secondary | ICD-10-CM | POA: Diagnosis not present

## 2016-05-31 DIAGNOSIS — K819 Cholecystitis, unspecified: Secondary | ICD-10-CM | POA: Diagnosis not present

## 2016-05-31 DIAGNOSIS — E119 Type 2 diabetes mellitus without complications: Secondary | ICD-10-CM | POA: Diagnosis not present

## 2016-05-31 DIAGNOSIS — N189 Chronic kidney disease, unspecified: Secondary | ICD-10-CM | POA: Diagnosis not present

## 2016-05-31 DIAGNOSIS — I130 Hypertensive heart and chronic kidney disease with heart failure and stage 1 through stage 4 chronic kidney disease, or unspecified chronic kidney disease: Secondary | ICD-10-CM | POA: Diagnosis not present

## 2016-05-31 DIAGNOSIS — I5032 Chronic diastolic (congestive) heart failure: Secondary | ICD-10-CM | POA: Diagnosis not present

## 2016-05-31 NOTE — Telephone Encounter (Signed)
Santiago Glad the nurse case manager called from [facility] to let the doctors know that the patient has a drain site that is red and painful, She said that the drainage is dark red to pink. [name] also having less urination and the small is very bad. jw

## 2016-05-31 NOTE — Progress Notes (Signed)
   Subjective:   Tiffany [name] is a 56 y.o. female with a history of COPD, CHF, HTN, OSA, morbid obesity, T2 DM here for same day appt for troubles with cholecystostomy tube  Patient was initially schedule for CHF/COPD f/u.  She reports her breathing is stable.  [name] more concerned about a change in the drainage from her cholecystostomy tube.  It was placed by IR during hospitalization on 05/09/16.  It had been putting out serosanguinous drainage and now it looks green since yesterday.  It also rubs and gets irritated at insertion site.  Her [place] RN told her that it was infected.  She does not know when/if she was supposed to f/u with IR.  She denies fevers, chills, N/V/D, abd pain, CP.   Review of Systems:  Per HPI.   Social History: former smoker  Objective:  BP 120/60   Pulse 84   Temp 98.1 F (36.7 C) (Oral)   Ht 5\' 2"  (1.575 m)   Wt 299 lb (135.6 kg)   SpO2 94% Comment: on 6 liters  BMI 54.69 kg/m   Gen:  56 y.o. female in NAD HEENT: NCAT, MMM, anicteric sclerae CV: RRR, no MRG Resp: Non-labored, difficult to appreciate lung sounds due to habitus, on O2 via [place] Abd: Soft, NTND, BS present, no guarding or organomegaly, cholecystostomy tube in place, small amount of granulation tissue around insertion site, no erythema or leaks, bilious fluid in ostomy bag MSK: gait intact Neuro: Alert and oriented, speech normal      Assessment & Plan:     Tiffany [name] is a 56 y.o. female here for   Cholecystitis S/p cholecystostomy tube Has f/u with IR and cholangiogram scheduled for 5/31 No signs of infection, only granulation tissue Is draining bilious fluid which seems appropriate as it is in her gallbladder No fevers, abd pain, or other concerning findings Patient reassured and advised on f/u appt   Should f/u with PCP for respiratory issues  Virginia Crews, MD MPH PGY-3,  [place] [facility] 06/01/2016  9:52 AM

## 2016-05-31 NOTE — Telephone Encounter (Signed)
Looks like pt went to ED, however did not stay. Attempted to call without answer. Patient should f/u with IR if she is continuing to have problems. If she's having any fever or chills she needs to be evaluated in the ED immediately.  Archie Patten, MD [facility] Family Medicine Resident  05/31/2016, 9:49 AM

## 2016-06-01 ENCOUNTER — Other Ambulatory Visit: Payer: Self-pay | Admitting: *Deleted

## 2016-06-01 NOTE — Patient Outreach (Signed)
Tiffany [name]) Care Management   06/01/2016  Tiffany [name] [birth year] [id number]  Tiffany [name] is an 56 y.o. female  Subjective:   Member alert & oriented x3, denies pain or discomfort at this time.  Report compliance with medications.  She still has percutaneous drain for cholecystitis in place, denies signs/symptoms of infection.  She has been cleaning her incision site daily, emptying the drainage bag twice a day.  Site without signs of infection, some dry drainage at incision.  Drainage with bilious color, no puss or blood noted.  Objective:   Review of Systems  Constitutional: Negative.   HENT: Negative.   Eyes: Negative.   Respiratory: Negative.   Cardiovascular: Positive for leg swelling.  Gastrointestinal: Negative.   Genitourinary: Negative.   Musculoskeletal: Negative.   Skin: Negative.   Neurological: Negative.   Endo/Heme/Allergies: Negative.   Psychiatric/Behavioral: Negative.     Physical Exam  Constitutional: [name] oriented to person, place, and time. She appears well-developed and well-nourished.  Neck: Normal range of motion.  Cardiovascular: Normal rate, regular rhythm and normal heart sounds.   Respiratory: Effort normal and breath sounds normal.  GI: Soft. Bowel sounds are normal.  Musculoskeletal: Normal range of motion.  Neurological: [name] alert and oriented to person, place, and time.  Skin: Skin is warm and dry.   BP (!) 136/91 (BP Location: Left Arm, Patient Position: Sitting, Cuff Size: Normal)   Pulse 89   Resp (!) 22   Wt 299 lb (135.6 kg)   SpO2 96%   BMI 54.69 kg/m   Encounter Medications:   Outpatient Encounter Prescriptions as of 06/01/2016  Medication Sig  . acetaminophen (TYLENOL 8 HOUR) 650 MG CR tablet Take 1 tablet (650 mg total) by mouth every 8 (eight) hours as needed for pain.  [name] albuterol (PROVENTIL) (2.5 MG/3ML) 0.083% nebulizer solution Take 3 mLs (2.5 mg total) by nebulization every 6 (six) hours as  needed for wheezing or shortness of breath.  . Alcohol Swabs (B-D SINGLE USE SWABS REGULAR) PADS CHECK CAPILLARY BLOOD GLUCOSE ONE TIME DAILY  . amLODipine (NORVASC) 5 MG tablet Take 1 tablet (5 mg total) by mouth daily.  [name] aspirin 81 MG EC tablet Take 1 tablet (81 mg total) by mouth daily. Swallow whole.  [name] atorvastatin (LIPITOR) 40 MG tablet Take 1 tablet (40 mg total) by mouth daily.  . benztropine (COGENTIN) 0.5 MG tablet Take 1 tablet (0.5 mg total) by mouth daily.  . Blood Glucose Monitoring Suppl (ACCU-CHEK AVIVA PLUS) w/Device KIT CHECK BLOOD SUGAR THREE TIMES DAILY  . BREO ELLIPTA 100-25 MCG/INH AEPB INHALE 1 PUFF EVERY DAY  . divalproex ([name] ER) 500 MG 24 hr tablet Take 2 tablets (1,000 mg total) by mouth at bedtime.  [name] escitalopram (LEXAPRO) 20 MG tablet Take 1 tablet (20 mg total) by mouth daily.  . furosemide (LASIX) 40 MG tablet Take 2 tablets (80 mg total) by mouth 2 (two) times daily.  [name] gabapentin (NEURONTIN) 600 MG tablet Take 1 tablet (600 mg total) by mouth 3 (three) times daily.  [name] glucose blood test strip Use to check blood sugar 3 times daily.  [name] KLOR-CON M20 20 MEQ tablet Take 2 tablets by mouth 2 (two) times daily.  . metFORMIN (GLUCOPHAGE-XR) 500 MG 24 hr tablet TAKE 1 TABLET TWICE DAILY  . mirtazapine (REMERON) 30 MG tablet Take 1 tablet (30 mg total) by mouth at bedtime.  . montelukast (SINGULAIR) 10 MG tablet TAKE 1 TABLET AT BEDTIME  .  Multiple Vitamins-Minerals (MULTIVITAMIN WITH MINERALS) tablet Take 1 tablet by mouth daily. Reported on 07/28/2015  . naproxen (NAPROSYN) 500 MG tablet Take 500 mg by mouth daily.  [name] omeprazole (PRILOSEC) 40 MG capsule Take 1 capsule (40 mg total) by mouth daily. (Patient taking differently: Take 40 mg by mouth 2 (two) times daily. )  . OXYGEN Inhale 6 L into the lungs daily.  . potassium chloride (KLOR-CON) 20 MEQ packet TAKE 40 MEQ BY MOUTH 2 (TWO) TIMES DAILY.  [name] tiotropium (SPIRIVA HANDIHALER) 18 MCG inhalation capsule Place  1 capsule (18 mcg total) into inhaler and inhale daily.  . VENTOLIN HFA 108 (90 Base) MCG/ACT inhaler INHALE 2 PUFFS EVERY 6 HOURS AS NEEDED  . ziprasidone (GEODON) 80 MG capsule Take 1 capsule (80 mg total) by mouth at bedtime.   No facility-administered encounter medications on file as of 06/01/2016.     Functional Status:   In your present state of health, do you have any difficulty performing the following activities: 05/09/2016 04/25/2016  Hearing? N N  Vision? N N  Difficulty concentrating or making decisions? N N  Walking or climbing stairs? Y Y  Dressing or bathing? N Y  Doing errands, shopping? Y N  Preparing Food and eating ? - N  Using the Toilet? - N  In the past six months, have you accidently leaked urine? - N  Do you have problems with loss of bowel control? - N  Managing your Medications? - Y  Managing your Finances? - Y  Housekeeping or managing your Housekeeping? - Y  Some recent data might be hidden    Fall/Depression Screening:    Fall Risk  04/25/2016 04/17/2016 04/07/2016  Falls in the past year? No No No  Number falls in past yr: - - -  Injury with Fall? - - -  Risk Factor Category  - - -  Risk for fall due to : History of fall(s);Impaired mobility Impaired mobility -  Follow up - - -   PHQ 2/9 Scores 05/31/2016 05/26/2016 05/24/2016 04/25/2016 04/19/2016 04/17/2016 04/07/2016  PHQ - 2 Score 0 _0 0  PHQ- 9 Score - - - - - - -  Some encounter information is confidential and restricted. Go to Review Flowsheets activity to see all data.    Assessment:    Met with member at scheduled time.  She report accurate management of heart failure, including daily weights, however none (no blood pressures either) are documented on log.  Last weight documented was by this care manager during home visit last month.  She state she has been "lazy" and have not taken the time to document.  [name] advised to the importance of documenting daily weights in effort to control heart  failure and follow action plan/zones.  Proper diet also discussed as she currently has soda cans and [employer] cup on table.  She state that she does not like to cook, discussed healthier meal options at fast food restaurants, such as salads.  She verbalizes understanding, report that she has decreased the amount of sodas she drink and is now drinking more water.  She inquires about the location of her appointment on 5/30 regarding her drain.  State she was told to go to [facility] hospital, however her discharge instructions has [place].  Call placed to [place] to confirm she is to report at that office, member provided with phone number and address of appointment.  She also has follow up appointment  with primary MD on tomorrow and visit with LCSW, J. Saporito this afternoon.  She denies any questions at this time, advised to contact with concerns.  Plan:   Will follow up next week with weekly transition of care call.  THN CM Care Plan Problem One     Most Recent Value  Care Plan Problem One  Risk for hospital readmission related to recent admission for heart failure vs. possible end-stage COPD  Role Documenting the Problem One  Care Management Coordinator  Care Plan for Problem One  Active  THN Long Term Goal (31-90 days)  Patient will remain free from hospital readmision within the next 31 days  THN Long Term Goal Start Date  05/15/16 [Date reset due to readmission]  Interventions for Problem One Long Term Goal  RNCM educated patient on importance of following dc instructions, weighing daily and reporting weight gain, taking medications as prescribed, attending all MD follow up appointments  THN CM Short Term Goal #1 (0-30 days)  Patient will attend all follow up appointments as sceduled within the next 30 days  THN CM Short Term Goal #1 Start Date  05/15/16 [facility] follow up appointment related to recent discharge]  THN CM Short Term Goal #1 Met Date  06/01/16  Interventions for  Short Term Goal #1  RNCM ensured patient has scheduled follow up with ,  educated on importance of following up with cardiologist and pulmonologist as well for chronic conditions  THN CM Short Term Goal #2 (0-30 days)  Patient will take all medications as prescribed for the next 30 days  THN CM Short Term Goal #2 Start Date  05/15/16 [Date reset due to readmission]  Interventions for Short Term Goal #2  RNCM ensured patient has all prescribed medications and educated on importance of taking them as prescribed,  pharmacy referral for patient related to concerns/reported confusion about her medications    THN CM Care Plan Problem Two     Most Recent Value  Care Plan Problem Two  Risk for infection related to cholecystitis as evidenced by prescence of percutaneous drain  Role Documenting the Problem Two  Care Management Coordinator  Care Plan for Problem Two  Active  THN CM Short Term Goal #1 (0-30 days)  Member will not show signs of infection over the next 4 weeks  THN CM Short Term Goal #1 Start Date  05/15/16  Interventions for Short Term Goal #2   Member educated on signs/symptoms of infection, confirmed home health has started and member has been trained on proper technique of dressing changes/flushes.     [name], [place], MSN [place] [phone]

## 2016-06-01 NOTE — Patient Outreach (Signed)
[facility]) Care Management  06/01/2016  TONDA WIEDERHOLD Jun 08, 1960 [id number]   Call received from patient requesting to reschedule home visit scheduled for today (Thursday, Jun 01, 2016) at 1:00pm, as patient reports not feeling well today.  [name] voiced understanding, agreeing to meet with patient on Wednesday, Jun 07, 2016 at 2:00pm to assist with completion of Advanced Directives ([place] documents). [name], BSW, MSW, LCSW  Licensed Education officer, [facility]  [address] [place] [address] [place], [place] [address] Physical Address-[address], [place], [place] [address] Toll Free Main # [phone] Fax # [phone] Cell # [phone]  Office # [phone] [email or web address]

## 2016-06-01 NOTE — Assessment & Plan Note (Signed)
S/p cholecystostomy tube Has f/u with IR and cholangiogram scheduled for 5/31 No signs of infection, only granulation tissue Is draining bilious fluid which seems appropriate as it is in her gallbladder No fevers, abd pain, or other concerning findings Patient reassured and advised on f/u appt

## 2016-06-02 ENCOUNTER — Ambulatory Visit (INDEPENDENT_AMBULATORY_CARE_PROVIDER_SITE_OTHER): Payer: Medicare HMO | Admitting: Family Medicine

## 2016-06-02 ENCOUNTER — Encounter: Payer: Self-pay | Admitting: Family Medicine

## 2016-06-02 VITALS — BP 102/68 | HR 83 | Temp 97.6°F | Ht 62.0 in | Wt 300.0 lb

## 2016-06-02 DIAGNOSIS — E876 Hypokalemia: Secondary | ICD-10-CM

## 2016-06-02 DIAGNOSIS — I5032 Chronic diastolic (congestive) heart failure: Secondary | ICD-10-CM

## 2016-06-02 NOTE — Assessment & Plan Note (Signed)
  Chronic, worsening. Weight today 300, dry weight unclear but patient has had 10 pound weight gain over past 2-3 weeks. Recent CXR 5/15 clear.   -increase 80 mg lasix from BID to TID -continue potassium supplementation -check weights daily -check BNP today -follow up in 3-4 days to assess fluid status

## 2016-06-02 NOTE — Assessment & Plan Note (Signed)
  Chronic, due to lasix usage. Recent K on 5/15 normal at 4  -continue potassium supplementation today 54meq BID -check BMP today -may need to increase potassium supplement while using more lasix -follow up 3-4 days, consider recheck BMP at this time

## 2016-06-02 NOTE — Patient Instructions (Signed)
  For the next several days take the Lasix 3 times a day.   Weigh yourself daily.  Please come back to be seen on Tuesday afternoon.  If you have any fevers or chills or your tube puts out milky-looking fluid please go to the ED  If you have questions or concerns please do not hesitate to call at [phone].  Lucila Maine, DO PGY-1, [place] [facility] 06/02/2016 11:43 AM

## 2016-06-02 NOTE — Progress Notes (Signed)
    Subjective:    Patient ID: Tiffany [name], female    DOB: 04/05/1960, 56 y.o.   MRN: [id number]   CC: SOB  SOB Progressively getting more SOB but not requiring more oxygen. Feels like she is retaining fluid- but thinks it's more in her chest. Legs not swollen. No cough. Taking 80 mg lasix BID. Does not pee much when she takes this. No Chest pain. Recent CXR 3 days ago. Also takes potassium due to hx of low potassium when taking lasix.   [name] otherwise feeling well. Cholecystostomy tube is putting out clear green fluid. Some tenderness around tube. Denies fevers or chills. Has appt with IR end of the month.  Smoking status reviewed- former smoker  Review of Systems- see HPI   Objective:  BP 102/68   Pulse 83   Temp 97.6 F (36.4 C) (Oral)   Ht 5\' 2"  (1.575 m)   Wt 300 lb (136.1 kg)   SpO2 94% Comment: on 6 liters  BMI 54.87 kg/m  Vitals and nursing note reviewed  General: morbidly obese lady with [place] in place, in NAD Cardiac: RRR, clear S1 and S2, no murmurs, rubs, or gallops Respiratory: clear to auscultation bilaterally, prolonged expiratory phase, no increased work of breathing Abdomen: obese abdomen. Cholecystostomy tube in place draining clear green fluid. Abdomen soft, nontender, nondistended. Extremities: +2 pitting edema mid shin bilaterally. Dorsalis pedis pulses equal bilaterally  Skin: warm and dry, no rashes noted Neuro: alert and oriented, no focal deficits  Assessment & Plan:    Chronic diastolic heart failure (HCC)  Chronic, worsening. Weight today 300, dry weight unclear but patient has had 10 pound weight gain over past 2-3 weeks. Recent CXR 5/15 clear.   -increase 80 mg lasix from BID to TID -continue potassium supplementation -check weights daily -check BNP today -follow up in 3-4 days to assess fluid status  Hypokalemia  Chronic, due to lasix usage. Recent K on 5/15 normal at 4  -continue potassium supplementation today 25meq BID -check  BMP today -may need to increase potassium supplement while using more lasix -follow up 3-4 days, consider recheck BMP at this time    Return in about 4 days (around 06/06/2016).   Lucila Maine, DO Family Medicine Resident PGY-1

## 2016-06-03 LAB — BASIC METABOLIC PANEL
BUN/Creatinine Ratio: 11 (ref 9–23)
BUN: 13 mg/dL (ref 6–24)
CO2: 30 mmol/L — ABNORMAL HIGH (ref 18–29)
Calcium: 9.3 mg/dL (ref 8.7–10.2)
Chloride: 101 mmol/L (ref 96–106)
Creatinine, Ser: 1.19 mg/dL — ABNORMAL HIGH (ref 0.57–1.00)
GFR calc Af Amer: 59 mL/min/{1.73_m2} — ABNORMAL LOW (ref >59)
GFR calc non Af Amer: 51 mL/min/{1.73_m2} — ABNORMAL LOW (ref >59)
Glucose: 102 mg/dL — ABNORMAL HIGH (ref 65–99)
Potassium: 4.8 mmol/L (ref 3.5–5.2)
Sodium: 144 mmol/L (ref 134–144)

## 2016-06-03 LAB — BRAIN NATRIURETIC PEPTIDE: BNP: 22.3 pg/mL (ref 0.0–100.0)

## 2016-06-05 ENCOUNTER — Other Ambulatory Visit: Payer: Self-pay | Admitting: Surgery

## 2016-06-05 DIAGNOSIS — F1420 Cocaine dependence, uncomplicated: Secondary | ICD-10-CM | POA: Diagnosis not present

## 2016-06-05 DIAGNOSIS — K819 Cholecystitis, unspecified: Secondary | ICD-10-CM

## 2016-06-05 DIAGNOSIS — I130 Hypertensive heart and chronic kidney disease with heart failure and stage 1 through stage 4 chronic kidney disease, or unspecified chronic kidney disease: Secondary | ICD-10-CM | POA: Diagnosis not present

## 2016-06-05 DIAGNOSIS — E119 Type 2 diabetes mellitus without complications: Secondary | ICD-10-CM | POA: Diagnosis not present

## 2016-06-05 DIAGNOSIS — J449 Chronic obstructive pulmonary disease, unspecified: Secondary | ICD-10-CM | POA: Diagnosis not present

## 2016-06-05 DIAGNOSIS — Z4682 Encounter for fitting and adjustment of non-vascular catheter: Secondary | ICD-10-CM | POA: Diagnosis not present

## 2016-06-05 DIAGNOSIS — I5032 Chronic diastolic (congestive) heart failure: Secondary | ICD-10-CM | POA: Diagnosis not present

## 2016-06-05 DIAGNOSIS — F3164 Bipolar disorder, current episode mixed, severe, with psychotic features: Secondary | ICD-10-CM | POA: Diagnosis not present

## 2016-06-05 DIAGNOSIS — N189 Chronic kidney disease, unspecified: Secondary | ICD-10-CM | POA: Diagnosis not present

## 2016-06-06 ENCOUNTER — Encounter: Payer: Self-pay | Admitting: Pharmacist

## 2016-06-06 ENCOUNTER — Ambulatory Visit (INDEPENDENT_AMBULATORY_CARE_PROVIDER_SITE_OTHER): Payer: Medicare HMO | Admitting: Family Medicine

## 2016-06-06 ENCOUNTER — Other Ambulatory Visit: Payer: Self-pay | Admitting: Pharmacist

## 2016-06-06 ENCOUNTER — Encounter: Payer: Self-pay | Admitting: Family Medicine

## 2016-06-06 VITALS — BP 118/79 | HR 94 | Temp 98.4°F | Wt 292.0 lb

## 2016-06-06 DIAGNOSIS — E118 Type 2 diabetes mellitus with unspecified complications: Secondary | ICD-10-CM

## 2016-06-06 DIAGNOSIS — I5030 Unspecified diastolic (congestive) heart failure: Secondary | ICD-10-CM | POA: Diagnosis not present

## 2016-06-06 NOTE — Patient Instructions (Signed)
It was good to meet you today,  For your heart failure, - Please keep taking lasix 80mg  three times a day as you have been and follow up with your doctor in 1 week.  We are checking some labs today, and someone will call you or send you a letter with the results when they are available.   I will send a message to your doctor to to send your medications over for the pill pack  Take care and seek immediate care sooner if you develop any concerns.   Dr. Bufford Lope, [place]

## 2016-06-06 NOTE — Progress Notes (Signed)
    Subjective:  Tiffany [name] is a 56 y.o. female who presents to the [facility] today for CHF follow up.  HPI:  CHF - Seen on 06/02/16 for fluid retention and SOB with 10# weight increase at that time. Was instructed to increase lasix 80mg  from BID to TID. - States breathing is improved, back to her baseline.    - no CP, palpitations, cough, fever/chills.   ROS: Per HPI  Objective:  Physical Exam: BP 118/79   Pulse 94   Temp 98.4 F (36.9 C) (Oral)   Wt 292 lb (132.5 kg)   SpO2 95% Comment: 6L O2  BMI 53.41 kg/m   Gen: NAD, resting comfortably with nasal cannula in place. In NAD CV: RRR with no murmurs appreciated Pulm: Some faint crackles at L lung base, otherwise clear to ausculation bilaterally. Prolonged expiratory phase. WOB appears to be at patient's baseline GI: Normal bowel sounds present. Soft, Nontender, Nondistended. MSK: 2+ pitting edema to midshin bilaterally. Skin: warm, dry Neuro: grossly normal, moves all extremities Psych: Normal affect and thought content  Results for orders placed or performed in visit on 06/06/16 (from the past 72 hour(s))  Basic metabolic panel     Status: Abnormal   Collection Time: 06/06/16  3:21 PM  Result Value Ref Range   Glucose 97 65 - 99 mg/dL   BUN 17 6 - 24 mg/dL   Creatinine, Ser 1.40 (H) 0.57 - 1.00 mg/dL   GFR calc non Af Amer 42 (L) >59 mL/min/1.73   GFR calc Af Amer 48 (L) >59 mL/min/1.73   BUN/Creatinine Ratio 12 9 - 23   Sodium 142 134 - 144 mmol/L   Potassium 4.5 3.5 - 5.2 mmol/L   Chloride 100 96 - 106 mmol/L   CO2 27 18 - 29 mmol/L   Calcium 9.7 8.7 - 10.2 mg/dL     Assessment/Plan:  (HFpEF) heart failure with preserved ejection fraction (HCC) Patient appeared improved with still with some signs of fluid overload. Weight is down from 300 to 292#, dry weight appears to be in high 280s. Given patient's improvement on lasix 80mg  TID, may be her new regimen for maintaining euvolemic state. - Continue lasix 80mg   TID - Check labs today, per chart review baseline Cr ~1.3-1.4 though lately appears to be more like ~1.1 - Follow up in 1 week with PCP   Bufford Lope, DO PGY-1, [place] [facility] 06/06/2016 2:51 PM

## 2016-06-06 NOTE — Patient Outreach (Signed)
[facility]) Care Management  [place]   06/06/2016  Tiffany [name] 1960-10-10 [id number]  Subjective: Home visit completed at patients home.  HIPAA identifiers were obtained.  Patient is a 56 year old female with multiple medical conditions including but not limited to: CHF, hypertension, COPD, type 2 diabetes, hyperlipidemia, pulmonary hypertension, and history of tobacco and narcotic abuse. Patient was hospitalized 04/10/16-04/15/16 for CHF exacerbation, 04/21/16 for hypokalemia, 05/09/16 for cholecystitis, and visited the ED for chest pain and post-op complications.  Patient manages her own medications and uses a pill box that she fills on her own.  Objective:   Encounter Medications: Outpatient Encounter Prescriptions as of 06/06/2016  Medication Sig Note  . acetaminophen (TYLENOL 8 HOUR) 650 MG CR tablet Take 1 tablet (650 mg total) by mouth every 8 (eight) hours as needed for pain.   [name] albuterol (PROVENTIL) (2.5 MG/3ML) 0.083% nebulizer solution Take 3 mLs (2.5 mg total) by nebulization every 6 (six) hours as needed for wheezing or shortness of breath.   . Alcohol Swabs (B-D SINGLE USE SWABS REGULAR) PADS CHECK CAPILLARY BLOOD GLUCOSE ONE TIME DAILY   . amLODipine (NORVASC) 5 MG tablet Take 1 tablet (5 mg total) by mouth daily.   [name] aspirin 81 MG EC tablet Take 1 tablet (81 mg total) by mouth daily. Swallow whole.   [name] atorvastatin (LIPITOR) 40 MG tablet Take 1 tablet (40 mg total) by mouth daily.   . benztropine (COGENTIN) 0.5 MG tablet Take 1 tablet (0.5 mg total) by mouth daily.   . Blood Glucose Monitoring Suppl (ACCU-CHEK AVIVA PLUS) w/Device KIT CHECK BLOOD SUGAR THREE TIMES DAILY   . BREO ELLIPTA 100-25 MCG/INH AEPB INHALE 1 PUFF EVERY DAY   . divalproex ([name] ER) 500 MG 24 hr tablet Take 2 tablets (1,000 mg total) by mouth at bedtime.   [name] escitalopram (LEXAPRO) 20 MG tablet Take 1 tablet (20 mg total) by mouth daily.   . furosemide (LASIX) 40 MG  tablet Take 2 tablets (80 mg total) by mouth 2 (two) times daily.   [name] gabapentin (NEURONTIN) 600 MG tablet Take 1 tablet (600 mg total) by mouth 3 (three) times daily.   [name] glucose blood test strip Use to check blood sugar 3 times daily.   [name] KLOR-CON M20 20 MEQ tablet Take 2 tablets by mouth 2 (two) times daily.   . metFORMIN (GLUCOPHAGE-XR) 500 MG 24 hr tablet TAKE 1 TABLET TWICE DAILY   . mirtazapine (REMERON) 30 MG tablet Take 1 tablet (30 mg total) by mouth at bedtime.   . montelukast (SINGULAIR) 10 MG tablet TAKE 1 TABLET AT BEDTIME   . Multiple Vitamins-Minerals (MULTIVITAMIN WITH MINERALS) tablet Take 1 tablet by mouth daily. Reported on 07/28/2015   . naproxen (NAPROSYN) 500 MG tablet Take 500 mg by mouth daily.   [name] omeprazole (PRILOSEC) 40 MG capsule Take 1 capsule (40 mg total) by mouth daily. (Patient taking differently: Take 40 mg by mouth 2 (two) times daily. )   . oxybutynin (DITROPAN) 5 MG tablet Take 5 mg by mouth 2 (two) times daily. 06/06/2016: Was not on current med list but patient reported taking and the tablets were in her pill box.  . OXYGEN Inhale 6 L into the lungs daily.   . potassium chloride (KLOR-CON) 20 MEQ packet TAKE 40 MEQ BY MOUTH 2 (TWO) TIMES DAILY.   [name] tiotropium (SPIRIVA HANDIHALER) 18 MCG inhalation capsule Place 1 capsule (18 mcg total) into inhaler and inhale daily.   [name]  VENTOLIN HFA 108 (90 Base) MCG/ACT inhaler INHALE 2 PUFFS EVERY 6 HOURS AS NEEDED   . ziprasidone (GEODON) 80 MG capsule Take 1 capsule (80 mg total) by mouth at bedtime.    No facility-administered encounter medications on file as of 06/06/2016.     Functional Status: In your present state of health, do you have any difficulty performing the following activities: 05/09/2016 04/25/2016  Hearing? N N  Vision? N N  Difficulty concentrating or making decisions? N N  Walking or climbing stairs? Y Y  Dressing or bathing? N Y  Doing errands, shopping? Y N  Preparing Food and eating ? - N   Using the Toilet? - N  In the past six months, have you accidently leaked urine? - N  Do you have problems with loss of bowel control? - N  Managing your Medications? - Y  Managing your Finances? - Y  Housekeeping or managing your Housekeeping? - Y  Some recent data might be hidden    Fall/Depression Screening: Fall Risk  04/25/2016 04/17/2016 04/07/2016  Falls in the past year? No No No  Number falls in past yr: - - -  Injury with Fall? - - -  Risk Factor Category  - - -  Risk for fall due to : History of fall(s);Impaired mobility Impaired mobility -  Follow up - - -   PHQ 2/9 Scores 06/02/2016 05/31/2016 05/26/2016 05/24/2016 04/25/2016 04/19/2016 04/17/2016  PHQ - 2 Score 0 0 _0 PHQ- 9 Score - - - - - - -  Some encounter information is confidential and restricted. Go to Review Flowsheets activity to see all data.      Assessment:  Patient's medications were reviewed in her pill box.  Unfortunately, the patient is not able to fill her pill box correctly.   Adherence: Amlodipine-was not in the seven day pill box Benztropine-was not in the seven day pill box Potassium-dose on med list states 44mq twice daily, patient only had 1 potassium tablet per dose in the pill box--meaning she was only getting 20MEq per dose.   Dose discrepancy- Furosemide-patient reported she is supposed to take furosemide 436m 1 tablet three times daily.  She has been taking furosemide in the morning, at noon and at bedtime.  Omitted medication Oxybutynin-was not on the patient's current active medication list.  Patient said she restarted Oxybutynin about 2 weeks ago because she was wetting herself.  (Patient has been taking 401mf Furosemide three times daily).  Recommendation-patient should get her medications from a pharmacy that will do pill packing to help with adherence.  Several local pharmacies provide pill packing services and these were reviewed with the patient. [facility] them [facility] her home.  Plan:  1.  Route note to PCP and [facility]  Follow up with the patient within the next 7 days to follow up on pill packaging.    KatElayne GuerinharmD, BCAOlm[place]inical Pharmacist [phone]

## 2016-06-07 ENCOUNTER — Telehealth: Payer: Self-pay | Admitting: Family Medicine

## 2016-06-07 ENCOUNTER — Other Ambulatory Visit: Payer: Self-pay | Admitting: *Deleted

## 2016-06-07 DIAGNOSIS — E119 Type 2 diabetes mellitus without complications: Secondary | ICD-10-CM | POA: Diagnosis not present

## 2016-06-07 DIAGNOSIS — N3281 Overactive bladder: Secondary | ICD-10-CM | POA: Insufficient documentation

## 2016-06-07 DIAGNOSIS — I130 Hypertensive heart and chronic kidney disease with heart failure and stage 1 through stage 4 chronic kidney disease, or unspecified chronic kidney disease: Secondary | ICD-10-CM | POA: Diagnosis not present

## 2016-06-07 DIAGNOSIS — N189 Chronic kidney disease, unspecified: Secondary | ICD-10-CM | POA: Diagnosis not present

## 2016-06-07 DIAGNOSIS — J449 Chronic obstructive pulmonary disease, unspecified: Secondary | ICD-10-CM | POA: Diagnosis not present

## 2016-06-07 DIAGNOSIS — F200 Paranoid schizophrenia: Secondary | ICD-10-CM

## 2016-06-07 DIAGNOSIS — F3164 Bipolar disorder, current episode mixed, severe, with psychotic features: Secondary | ICD-10-CM | POA: Diagnosis not present

## 2016-06-07 DIAGNOSIS — K819 Cholecystitis, unspecified: Secondary | ICD-10-CM | POA: Diagnosis not present

## 2016-06-07 DIAGNOSIS — I5032 Chronic diastolic (congestive) heart failure: Secondary | ICD-10-CM | POA: Diagnosis not present

## 2016-06-07 DIAGNOSIS — Z4682 Encounter for fitting and adjustment of non-vascular catheter: Secondary | ICD-10-CM | POA: Diagnosis not present

## 2016-06-07 DIAGNOSIS — F1420 Cocaine dependence, uncomplicated: Secondary | ICD-10-CM | POA: Diagnosis not present

## 2016-06-07 LAB — BASIC METABOLIC PANEL
BUN/Creatinine Ratio: 12 (ref 9–23)
BUN: 17 mg/dL (ref 6–24)
CO2: 27 mmol/L (ref 18–29)
Calcium: 9.7 mg/dL (ref 8.7–10.2)
Chloride: 100 mmol/L (ref 96–106)
Creatinine, Ser: 1.4 mg/dL — ABNORMAL HIGH (ref 0.57–1.00)
GFR calc Af Amer: 48 mL/min/{1.73_m2} — ABNORMAL LOW (ref >59)
GFR calc non Af Amer: 42 mL/min/{1.73_m2} — ABNORMAL LOW (ref >59)
Glucose: 97 mg/dL (ref 65–99)
Potassium: 4.5 mmol/L (ref 3.5–5.2)
Sodium: 142 mmol/L (ref 134–144)

## 2016-06-07 NOTE — Telephone Encounter (Signed)
Additionally, we may have issues given the patient's psychiatrist is in charge of some of her other medications like Geodon.  Archie Patten, MD [facility] Family Medicine Resident  06/07/2016, 11:37 AM

## 2016-06-07 NOTE — Patient Outreach (Signed)
[place] [facility]) Care Management  06/07/2016  Tiffany [name] 1960-05-21 [id number]   [name] made two attempts to try and contact patient today to ensure that patient is aware of her scheduled home visit with [name] for today at 2:00pm; however, patient was unavailable at the time of [name]'s calls and [name] was unable to leave a message, as patient's mailbox is currently full.  [name] will continue to outreach to patient, prior to the time of the scheduled home visit, to offer patient a gentle reminder, as patient has forgotten of scheduled appointments in the past, resulting in a "no show" with [name].  [name] will also drive out to patient's home and be available at 2:00pm if [name] is not able to make contact with patient in the meantime. [name], BSW, MSW, LCSW  Licensed Education officer, environmental Health System  Mailing [place] [address] [place], [place] [address] Physical Address-[address], [place],  [address] Toll Free Main # [phone] Fax # [phone] Cell # [phone]  Office # [phone] [email or web address]

## 2016-06-07 NOTE — Assessment & Plan Note (Signed)
Patient appeared improved with still with some signs of fluid overload. Weight is down from 300 to 292#, dry weight appears to be in high 280s. Given patient's improvement on lasix 80mg  TID, may be her new regimen for maintaining euvolemic state. - Continue lasix 80mg  TID - Check labs today, per chart review baseline Cr ~1.3-1.4 though lately appears to be more like ~1.1 - Follow up in 1 week with PCP

## 2016-06-07 NOTE — Patient Outreach (Signed)
[place] [facility]) Care Management  06/07/2016  DANARIA LARSEN Sep 06, 1960 [id number]   [name] received a return call from patient today, in regards to the HIPAA compliant message that [name] left on patient's voicemail just prior to patient's call.  Patient admitted that she had forgotten about [name]'s scheduled home visit today at 2:00pm, indicating that she would need to reschedule because her home health nurse is planning to come out today to perform wound care and dressing changes.  [name] reminded patient that this is [name]'s fourth attempt at trying to meet with patient for the initial home visit to assist with completion of her Advanced Directives (Living Will and [facility] documents), without success. [name] went on to say that patient is more than welcome to have her Advanced Directives initiated when she is inpatient in the hospital, as they have witnesses and notaries readily available, which [name] has tried to initiate for patient twice, but patient refused both times. Patient admitted to [place] that she has "too much going on with her doctors right now", and that completing her Advanced Directives is not much of a priority.  [name] voiced understanding, encouraging patient to contact [name] directly when she is ready to initiate.  Otherwise, [name] will be closing patient's case.  [name] was able to ensure that patient has the correct contact information for [name].  [name] also expressed concern that patient does not remember having spoken with Kathrine Cords, Resident with [facility], as it appears that Dr. Lorenso Courier has conversed with patient on three different occasions today.  Patient voiced understanding, admitting that she was "in and out of sleep" when she received phone calls today, agreeing to contact Dr. Lorenso Courier to discuss content of the conversations from earlier today. [name] will perform a case closure on patient, as all goals of treatment have been met from social work  standpoint and no additional social work needs have been identified at this time.  [name] will notify patient's RNCM with [place] [facility], [name] of [name]'s plans to close patient's case.  [name] will fax an update to patient's Primary Care Physician, Dr. Kathrine Cords to ensure that they are aware of [name]'s involvement with patient's plan of care.  [name] will submit a case closure request to Verlon Setting, Care Management Assistant with [place] [facility], in the form of an [employer].  [name] will ensure that Mrs. Comer is aware of Mrs. Lane's, RNCM with [place] [facility], continued involvement with patient's care. [name], BSW, MSW, LCSW  Licensed Education officer, environmental Health System  Mailing [place] [address] [place], [place] [address] Physical Address-[name], [place], [place] [address] Toll Free Main # [phone] Fax # [phone] Cell # [phone]  Office # [phone] [email or web address]

## 2016-06-07 NOTE — Telephone Encounter (Signed)
Patient noted to have significant issues with filling her on pill box.  [facility] pharmacist suggested sending Rx to [facility] for pill packaging. On my review, there is only [facility]. Called [facility] and left a message to see if this is, in fact, the correct pharmacy.  Patient is supposed to be on amlodipine and benztropine. We are at a disadvantage as what the pt states she's taking is not actually what she's taking-- this becomes difficult for prescribing things like potassium and Lasix.  Will in fact increase her dose to lasix 80mg  (what she's supposed to be taking) and keep potassium at 3mEQ BID.  Patient may need to keep a bottle of these 2 medications (what she has left over) in case we need to increase either medication.  Archie Patten, MD [facility] Family Medicine Resident  06/07/2016, 11:27 AM

## 2016-06-07 NOTE — Assessment & Plan Note (Signed)
Holding oxybutynin in setting of AKI on 04/2016.

## 2016-06-08 ENCOUNTER — Other Ambulatory Visit: Payer: Self-pay | Admitting: Pharmacist

## 2016-06-08 ENCOUNTER — Other Ambulatory Visit (HOSPITAL_COMMUNITY): Payer: Self-pay | Admitting: Psychiatry

## 2016-06-08 ENCOUNTER — Ambulatory Visit
Admission: RE | Admit: 2016-06-08 | Discharge: 2016-06-08 | Disposition: A | Discharge status: Home / Self Care | Payer: Medicare HMO | Source: Ambulatory Visit | Attending: Radiology | Admitting: Radiology

## 2016-06-08 ENCOUNTER — Ambulatory Visit
Admission: RE | Admit: 2016-06-08 | Discharge: 2016-06-08 | Disposition: A | Discharge status: Home / Self Care | Payer: Medicare HMO | Source: Ambulatory Visit | Attending: Surgery | Admitting: Surgery

## 2016-06-08 DIAGNOSIS — K819 Cholecystitis, unspecified: Secondary | ICD-10-CM

## 2016-06-08 DIAGNOSIS — Z4803 Encounter for change or removal of drains: Secondary | ICD-10-CM | POA: Diagnosis not present

## 2016-06-08 HISTORY — PX: IR RADIOLOGIST EVAL & MGMT: IMG5224

## 2016-06-08 NOTE — Progress Notes (Signed)
Chief Complaint: Patient was seen in consultation today for No chief complaint on file.  at the request of Turpin,Pamela  Referring Physician(s): Turpin,Pamela  History of Present Illness: DEMRI POULTON is a 56 y.o. female who had a cholecystostomy tube placed one month ago. She had acute cholecystitis and was a nonsurgical candidate. She returns for evaluation of her drain. She explains that the output has changed colors and she is having increasing pain at the site. She denies fevers or vomiting. She has been having at least 2-400 mL of output per day from the cholecystostomy tube.  Past Medical History:  Diagnosis Date  . Anxiety   . Arthritis    "all over" (04/10/2016)  . Asthma   . CHF (congestive heart failure) ([place])   . Chronic bronchitis ([place])   . Chronic kidney disease    "I see a kidney dr." (04/10/2016)  . Cocaine abuse   . Complication of anesthesia    decreased bp, decreased heart rate  . Depression   . Disorder of nervous system   . Emphysema   . GERD (gastroesophageal reflux disease)   . Heart attack ([place]) 1980s  . History of blood transfusion 1994   "couldn't stop bleeding from my period"  . Hyperlipidemia LDL goal <70   . Hypertension   . Incontinence   . Manic depression ([place])   . On home oxygen therapy    "6L; 24/7" (04/10/2016)  . OSA on CPAP    "wear mask sometimes" (04/10/2016)  . Paranoid ([place])    "sometimes; I'm on RX for it" (04/10/2016)  . Pneumonia    "I've had it several times; haven't had it since 06/2015" (04/10/2016)  . Schizophrenia ([place])   . Seasonal allergies   . Seizures ([place])    "don't know what kind; last one was ~ 1 yr ago" (04/10/2016)  . Sinus trouble   . Stroke [facility]) 1980s   denies residual on 04/10/2016  . Type II diabetes mellitus ([place])     Past Surgical History:  Procedure Laterality Date  . CESAREAN SECTION  1997  . HERNIA REPAIR    . IR PERC CHOLECYSTOSTOMY  05/10/2016  . UMBILICAL HERNIA REPAIR  ~ 1963   "that's  why I don't have a belly button"  . VAGINAL HYSTERECTOMY      Allergies: Hydroxyzine; Latuda [lurasidone hcl]; Codeine; and Sulfa antibiotics  Medications: Prior to Admission medications   Medication Sig Start Date End Date Taking? Authorizing Provider  acetaminophen (TYLENOL 8 HOUR) 650 MG CR tablet Take 1 tablet (650 mg total) by mouth every 8 (eight) hours as needed for pain. 04/27/16   Archie Patten, MD  albuterol (PROVENTIL) (2.5 MG/3ML) 0.083% nebulizer solution Take 3 mLs (2.5 mg total) by nebulization every 6 (six) hours as needed for wheezing or shortness of breath. 02/18/16   Rumley, Burna Cash, DO  Alcohol Swabs (B-D SINGLE USE SWABS REGULAR) PADS CHECK CAPILLARY BLOOD GLUCOSE ONE TIME DAILY 02/03/16   Archie Patten, MD  amLODipine (NORVASC) 5 MG tablet Take 1 tablet (5 mg total) by mouth daily. 05/05/16   Archie Patten, MD  aspirin 81 MG EC tablet Take 1 tablet (81 mg total) by mouth daily. Swallow whole. 12/05/15   Pucilowska, Herma Ard B, MD  atorvastatin (LIPITOR) 40 MG tablet Take 1 tablet (40 mg total) by mouth daily. 12/05/15   Pucilowska, Herma Ard B, MD  benztropine (COGENTIN) 0.5 MG tablet Take 1 tablet (0.5 mg total) by mouth daily.  05/23/16 05/23/17  Arfeen, Syed T, MD  Blood Glucose Monitoring Suppl (ACCU-CHEK AVIVA PLUS) w/Device KIT CHECK BLOOD SUGAR THREE TIMES DAILY 12/02/15   Dorsey, Crystal S, MD  BREO ELLIPTA 100-25 MCG/INH AEPB INHALE 1 PUFF EVERY DAY 02/15/16   Mayo, Katy Dodd, MD  divalproex ([name] ER) 500 MG 24 hr tablet Take 2 tablets (1,000 mg total) by mouth at bedtime. 05/23/16 05/23/17  Arfeen, Syed T, MD  escitalopram (LEXAPRO) 20 MG tablet Take 1 tablet (20 mg total) by mouth daily. 05/23/16   Arfeen, Syed T, MD  furosemide (LASIX) 40 MG tablet Take 2 tablets (80 mg total) by mouth 2 (two) times daily. 05/25/16   Dorsey, Crystal S, MD  gabapentin (NEURONTIN) 600 MG tablet Take 1 tablet (600 mg total) by mouth 3 (three) times daily. 12/05/15   Pucilowska,  Jolanta B, MD  glucose blood test strip Use to check blood sugar 3 times daily. 12/05/15   Pucilowska, Jolanta B, MD  KLOR-CON M20 20 MEQ tablet Take 2 tablets by mouth 2 (two) times daily. 04/24/16   Dorsey, Crystal S, MD  metFORMIN (GLUCOPHAGE-XR) 500 MG 24 hr tablet TAKE 1 TABLET TWICE DAILY 03/28/16   Dorsey, Crystal S, MD  mirtazapine (REMERON) 30 MG tablet Take 1 tablet (30 mg total) by mouth at bedtime. 05/23/16   Arfeen, Syed T, MD  montelukast (SINGULAIR) 10 MG tablet TAKE 1 TABLET AT BEDTIME 04/24/16   Dorsey, Crystal S, MD  OXYGEN Inhale 6 L into the lungs daily.    [provider]  potassium chloride (KLOR-CON) 20 MEQ packet TAKE 40 MEQ BY MOUTH 2 (TWO) TIMES DAILY. 05/30/16   Dorsey, Crystal S, MD  tiotropium (SPIRIVA HANDIHALER) 18 MCG inhalation capsule Place 1 capsule (18 mcg total) into inhaler and inhale daily. 12/05/15   Pucilowska, Jolanta B, MD  ziprasidone (GEODON) 80 MG capsule Take 1 capsule (80 mg total) by mouth at bedtime. 05/23/16   Arfeen, Syed T, MD     Family History  Problem Relation Age of Onset  . Cancer Father        prostate  . Cancer Mother        lung  . Depression Mother   . Depression Sister   . Anxiety disorder Sister   . Schizophrenia Sister   . Bipolar disorder Sister   . Depression Sister   . Depression Brother   . Heart failure Unknown        cousin    Social History   Social History  . Marital status: Widowed    Spouse name: N/A  . Number of children: 3  . Years of education: N/A   Occupational History  . disabled     factory production   Social History Main Topics  . Smoking status: Former Smoker    Packs/day: 1.50    Years: 38.00    Types: Cigarettes    Start date: 03/13/1977  . Smokeless tobacco: Never Used  . Alcohol use No  . Drug use: Yes    Types: Cocaine     Comment: 04/10/2016 "last used cocaine back in November 2017"  . Sexual activity: No   Other Topics Concern  . Not on file   Social History Narrative    Has 1 son, [name]   Lives with son and his boyfriend   Her house has ramps and handrails should she ever needs them.    Her mother lives down the street from her and is a good support person   in addition to her son.   She drives herself, has private transportation.    Cocaine free since 02/24/16, smoke free since 04/10/16         Review of Systems: A 12 point ROS discussed and pertinent positives are indicated in the HPI above.  All other systems are negative.  Review of Systems  Vital Signs: BP 127/75   Pulse 95   Temp 98.3 F (36.8 C)   SpO2 94%   Physical Exam Evaluation of her abdomen demonstrates a right upper quadrant cholecystostomy tube in place. The site is clean and dry without signs of infection. The stitch remains intact. Bilious output is noted in the bag. There is no leak.  Imaging: Dg Chest 2 View  Result Date: 05/30/2016 CLINICAL DATA:  Midsternal left-sided chest pain EXAM: CHEST  2 VIEW COMPARISON:  05/24/2016 FINDINGS: The heart size and mediastinal contours are within normal limits. Both lungs are clear. Mild degenerative changes are seen along the mid thoracic spine. IMPRESSION: No active cardiopulmonary disease. Electronically Signed   By: David  Kwon M.D.   On: 05/30/2016 18:16   Dg Chest 2 View  Result Date: 05/25/2016 CLINICAL DATA:  Shortness of breath for 3 months. Lower extremity edema. Former smoker. History of asthma, CHF, chronic bronchitis, heart attack, pneumonia, diabetes. EXAM: CHEST  2 VIEW COMPARISON:  05/09/2016 FINDINGS: The heart size and mediastinal contours are within normal limits. Both lungs are clear. The visualized skeletal structures are unremarkable. IMPRESSION: No active cardiopulmonary disease. Electronically Signed   By: William  Stevens M.D.   On: 05/25/2016 00:44   Nm Hepatobiliary Liver Func  Result Date: 05/09/2016 CLINICAL DATA:  Right upper quadrant abdominal pain for 1 day. Leukocytosis. Sonographic findings suspicious for acute  cholecystitis. EXAM: NUCLEAR MEDICINE HEPATOBILIARY IMAGING TECHNIQUE: Sequential images of the abdomen were obtained out to 60 minutes following intravenous administration of radiopharmaceutical. RADIOPHARMACEUTICALS:  5.1 mCi Tc-99m  Choletec IV COMPARISON:  Ultrasound 05/09/2016. FINDINGS: Initial images demonstrate homogeneous hepatic activity and spontaneous filling of the biliary system. There is drainage into duodenum with some reflux into the stomach. No opacification of the gallbladder is identified. Patient refused morphine sulfate administration (typically given to promote contraction of the sphincter of Oddi and increased specificity of non filling of the gallbladder). IMPRESSION: No opacification of the cystic duct or gallbladder, supporting the presence of acute cholecystitis. Specificity decreased by lack of post morphine imaging. Electronically Signed   By: William  Veazey M.D.   On: 05/09/2016 15:31   Ir Perc Cholecystostomy  Result Date: 05/10/2016 INDICATION: Acute cholecystitis.  Poor operative candidate. EXAM: ULTRASOUND AND FLUOROSCOPIC-GUIDED CHOLECYSTOSTOMY TUBE PLACEMENT COMPARISON:  Right upper quadrant abdominal ultrasound - 05/09/2016; nuclear medicine HIDA scan -05/09/2016 MEDICATIONS: The patient is currently admitted to the hospital and on intravenous antibiotics. Antibiotics were administered within an appropriate time frame prior to skin puncture. ANESTHESIA/SEDATION: Moderate (conscious) sedation was employed during this procedure. A total of Versed 1 mg and Fentanyl 50 mcg was administered intravenously. Moderate Sedation Time: 12 minutes. The patient's level of consciousness and vital signs were monitored continuously by radiology nursing throughout the procedure under my direct supervision. CONTRAST:  15 cc Isovue-300 - administered into the gallbladder fossa. FLUOROSCOPY TIME:  1 minutes 6 seconds (144 mGy) COMPLICATIONS: None immediate. PROCEDURE: Informed written consent  was obtained from the patient after a discussion of the risks, benefits and alternatives to treatment. Questions regarding the procedure were encouraged and answered. A timeout was performed prior to the initiation of the   procedure. The right upper abdominal quadrant was prepped and draped in the usual sterile fashion, and a sterile drape was applied covering the operative field. Maximum barrier sterile technique with sterile gowns and gloves were used for the procedure. A timeout was performed prior to the initiation of the procedure. Local anesthesia was provided with 1% lidocaine with epinephrine. Ultrasound scanning of the right upper quadrant demonstrates a mildly dilated gallbladder with minimal amount of associated pericholecystic fluid. Of note, the patient reported pain with ultrasound imaging over the gallbladder. Utilizing a transhepatic approach, a 22 gauge needle was advanced into the gallbladder under direct ultrasound guidance. An ultrasound image was saved for documentation purposes. Appropriate intraluminal puncture was confirmed with the efflux of bile and advancement of an 0.018 wire into the gallbladder lumen. The needle was exchanged for an Accustick set. A small amount of contrast was injected to confirm appropriate intraluminal positioning. Over a Benson wire, a 10.2-French Cook cholecystomy tube was advanced into the gallbladder fossa, coiled and locked. Bile was aspirated and a small amount of contrast was injected as several post procedural spot radiographic images were obtained in various obliquities. The catheter was secured to the skin with suture, connected to a drainage bag and a dressing was placed. The patient tolerated the procedure well without immediate post procedural complication. IMPRESSION: Successful ultrasound and fluoroscopic guided placement of a 10.2 French cholecystostomy tube. Electronically Signed   By: John  Watts M.D.   On: 05/10/2016 10:07   Dg Cholangiogram   Existing Tube  Result Date: 06/08/2016 INDICATION: Cholecystostomy tube placed 4 weeks ago EXAM: CHOLANGIOGRAM MEDICATIONS: None ANESTHESIA/SEDATION: None FLUOROSCOPY TIME:  Fluoroscopy Time:  minutes 20 seconds ( mGy). COMPLICATIONS: None immediate. PROCEDURE: Informed written consent was obtained from the patient after a thorough discussion of the procedural risks, benefits and alternatives. All questions were addressed. Maximal Sterile Barrier Technique was utilized including caps, mask, sterile gowns, sterile gloves, sterile drape, hand hygiene and skin antiseptic. A timeout was performed prior to the initiation of the procedure. Contrast was injected into the cholecystostomy tube and imaging was obtained. FINDINGS: The tube is stable in position and coiled in the lumen of the gallbladder. Several filling defects in the gallbladder are noted compatible with gallstones. Contrast fills the biliary tree compatible with patency of the cystic duct. IMPRESSION: The drain is stable in position and the cystic duct is patent. Gallstones are noted. Electronically Signed   By:    M.D.   On: 06/08/2016 14:30    Labs:  CBC:  Recent Labs  05/10/16 0644 05/11/16 0525 05/12/16 0534 05/30/16 1652  WBC 15.8* 11.3* 11.1* 11.1*  HGB 9.6* 10.3* 9.9* 9.9*  HCT 31.2* 33.5* 32.7* 33.7*  PLT 255 281 336 426*    COAGS:  Recent Labs  05/09/16 1625  INR 1.00    BMP:  Recent Labs  05/24/16 1643 05/30/16 1652 06/02/16 1150 06/06/16 1521  NA 147* 139 144 142  K 4.6 4.0 4.8 4.5  CL 99 98* 101 100  CO2 32* 34* 30* 27  GLUCOSE 122* 84 102* 97  BUN 22 14 13 17  CALCIUM 9.4 9.5 9.3 9.7  CREATININE 1.26* 1.09* 1.19* 1.40*  GFRNONAA 47* 55* 51* 42*  GFRAA 55* >60 59* 48*    LIVER FUNCTION TESTS:  Recent Labs  07/09/15 1325 11/24/15 2116 04/10/16 1650 04/22/16 1017 05/09/16 0419  BILITOT 1.5* 0.9 0.4  --  0.3  AST 15 18 20  --  22  ALT 15 12* 18  --    18  ALKPHOS 100 122 95  --   101  PROT 7.3 6.5 6.1*  --  6.1*  ALBUMIN 3.9 3.4* 3.3* 3.2* 3.4*    TUMOR MARKERS: No results for input(s): AFPTM, CEA, CA199, CHROMGRNA in the last 8760 hours.  Assessment and Plan:  The cholecystostomy tube is stable in position and functioning. The cystic duct is patent. Gallstones are noted. The drain can be removed if desired. Alternatively, the patient can undergo cholecystostomy tube if she is a surgical candidate. As a last resort, drain can be left in place chronically. We are happy to remove the drain if she is a nonsurgical candidate and the plan is to remove the drain.   Electronically Signed: , ART A 06/08/2016, 2:39 PM   I spent a total of   15 Minutes in face to face in clinical consultation, greater than 50% of which was counseling/coordinating care for cholecystostomy.  Patient ID: Byanca M Garvey, female   DOB: 10/31/1960, 56 y.o.   MRN: [id number]  

## 2016-06-09 ENCOUNTER — Other Ambulatory Visit: Payer: Self-pay | Admitting: *Deleted

## 2016-06-09 DIAGNOSIS — I130 Hypertensive heart and chronic kidney disease with heart failure and stage 1 through stage 4 chronic kidney disease, or unspecified chronic kidney disease: Secondary | ICD-10-CM | POA: Diagnosis not present

## 2016-06-09 DIAGNOSIS — K819 Cholecystitis, unspecified: Secondary | ICD-10-CM | POA: Diagnosis not present

## 2016-06-09 DIAGNOSIS — E119 Type 2 diabetes mellitus without complications: Secondary | ICD-10-CM | POA: Diagnosis not present

## 2016-06-09 DIAGNOSIS — N189 Chronic kidney disease, unspecified: Secondary | ICD-10-CM | POA: Diagnosis not present

## 2016-06-09 DIAGNOSIS — F1420 Cocaine dependence, uncomplicated: Secondary | ICD-10-CM | POA: Diagnosis not present

## 2016-06-09 DIAGNOSIS — F3164 Bipolar disorder, current episode mixed, severe, with psychotic features: Secondary | ICD-10-CM | POA: Diagnosis not present

## 2016-06-09 DIAGNOSIS — J449 Chronic obstructive pulmonary disease, unspecified: Secondary | ICD-10-CM | POA: Diagnosis not present

## 2016-06-09 DIAGNOSIS — Z4682 Encounter for fitting and adjustment of non-vascular catheter: Secondary | ICD-10-CM | POA: Diagnosis not present

## 2016-06-09 DIAGNOSIS — I5032 Chronic diastolic (congestive) heart failure: Secondary | ICD-10-CM | POA: Diagnosis not present

## 2016-06-09 NOTE — Patient Outreach (Signed)
[facility]) Care Management  06/09/2016  Tiffany [name] 12-24-1960 [id number]   Weekly transition of care call placed to member, no answer.  HIPAA compliant voice message left, will await call back.  If no call back, will follow up next week.  [name], [place], MSN [place] [phone]

## 2016-06-09 NOTE — Patient Outreach (Signed)
[name]) Care Management  06/09/2016  Tiffany [name] 1960-03-24 [id number]   Dr. Lorenso [name] called to inquire about the name of the pharmacy patient is planning on switching her medication profile to for pill packaging.  It was confirmed, the name of the pharmacy is [name]. Dr. Lorenso [name] also wanted to be sure someone reached out to Dr. Adele [name] as he is the patient's psychiatrist and writes for her psychiatric medications.  Plan:  Send Dr. Adele [name] an inbasket message about the sending new prescriptions to [facility].   Tiffany [name], PharmD, [place] Clinical Pharmacist [phone]

## 2016-06-10 ENCOUNTER — Other Ambulatory Visit: Payer: Self-pay | Admitting: Internal Medicine

## 2016-06-11 ENCOUNTER — Other Ambulatory Visit: Payer: Self-pay

## 2016-06-11 ENCOUNTER — Encounter (HOSPITAL_COMMUNITY): Payer: Self-pay | Admitting: *Deleted

## 2016-06-11 ENCOUNTER — Emergency Department (HOSPITAL_COMMUNITY)
Admission: EM | Admit: 2016-06-11 | Discharge: 2016-06-12 | Disposition: A | Discharge status: Home / Self Care | Payer: Medicare HMO | Attending: Emergency Medicine | Admitting: Emergency Medicine

## 2016-06-11 DIAGNOSIS — J449 Chronic obstructive pulmonary disease, unspecified: Secondary | ICD-10-CM | POA: Diagnosis not present

## 2016-06-11 DIAGNOSIS — R531 Weakness: Secondary | ICD-10-CM | POA: Diagnosis present

## 2016-06-11 DIAGNOSIS — Y939 Activity, unspecified: Secondary | ICD-10-CM | POA: Insufficient documentation

## 2016-06-11 DIAGNOSIS — W19XXXA Unspecified fall, initial encounter: Secondary | ICD-10-CM | POA: Insufficient documentation

## 2016-06-11 DIAGNOSIS — Y929 Unspecified place or not applicable: Secondary | ICD-10-CM | POA: Insufficient documentation

## 2016-06-11 DIAGNOSIS — E1140 Type 2 diabetes mellitus with diabetic neuropathy, unspecified: Secondary | ICD-10-CM | POA: Insufficient documentation

## 2016-06-11 DIAGNOSIS — F17210 Nicotine dependence, cigarettes, uncomplicated: Secondary | ICD-10-CM | POA: Insufficient documentation

## 2016-06-11 DIAGNOSIS — Z8673 Personal history of transient ischemic attack (TIA), and cerebral infarction without residual deficits: Secondary | ICD-10-CM | POA: Insufficient documentation

## 2016-06-11 DIAGNOSIS — N189 Chronic kidney disease, unspecified: Secondary | ICD-10-CM | POA: Diagnosis not present

## 2016-06-11 DIAGNOSIS — E876 Hypokalemia: Secondary | ICD-10-CM | POA: Insufficient documentation

## 2016-06-11 DIAGNOSIS — R202 Paresthesia of skin: Secondary | ICD-10-CM | POA: Diagnosis not present

## 2016-06-11 DIAGNOSIS — I5032 Chronic diastolic (congestive) heart failure: Secondary | ICD-10-CM | POA: Insufficient documentation

## 2016-06-11 DIAGNOSIS — Y999 Unspecified external cause status: Secondary | ICD-10-CM | POA: Diagnosis not present

## 2016-06-11 DIAGNOSIS — J45909 Unspecified asthma, uncomplicated: Secondary | ICD-10-CM | POA: Diagnosis not present

## 2016-06-11 DIAGNOSIS — I130 Hypertensive heart and chronic kidney disease with heart failure and stage 1 through stage 4 chronic kidney disease, or unspecified chronic kidney disease: Secondary | ICD-10-CM | POA: Insufficient documentation

## 2016-06-11 DIAGNOSIS — I6789 Other cerebrovascular disease: Secondary | ICD-10-CM | POA: Diagnosis not present

## 2016-06-11 LAB — CBC
HCT: 35.1 % — ABNORMAL LOW (ref 36.0–46.0)
Hemoglobin: 10.3 g/dL — ABNORMAL LOW (ref 12.0–15.0)
MCH: 27.8 pg (ref 26.0–34.0)
MCHC: 29.3 g/dL — ABNORMAL LOW (ref 30.0–36.0)
MCV: 94.9 fL (ref 78.0–100.0)
Platelets: 312 10*3/uL (ref 150–400)
RBC: 3.7 MIL/uL — ABNORMAL LOW (ref 3.87–5.11)
RDW: 16.4 % — ABNORMAL HIGH (ref 11.5–15.5)
WBC: 9.5 10*3/uL (ref 4.0–10.5)

## 2016-06-11 LAB — BASIC METABOLIC PANEL
Anion gap: 8 (ref 5–15)
BUN: 17 mg/dL (ref 6–20)
CO2: 29 mmol/L (ref 22–32)
Calcium: 9.1 mg/dL (ref 8.9–10.3)
Chloride: 103 mmol/L (ref 101–111)
Creatinine, Ser: 1.32 mg/dL — ABNORMAL HIGH (ref 0.44–1.00)
GFR calc Af Amer: 51 mL/min — ABNORMAL LOW (ref >60)
GFR calc non Af Amer: 44 mL/min — ABNORMAL LOW (ref >60)
Glucose, Bld: 123 mg/dL — ABNORMAL HIGH (ref 65–99)
Potassium: 3.3 mmol/L — ABNORMAL LOW (ref 3.5–5.1)
Sodium: 140 mmol/L (ref 135–145)

## 2016-06-11 LAB — TROPONIN I: Troponin I: <0.03 ng/mL (ref <0.03)

## 2016-06-11 LAB — URINALYSIS, ROUTINE W REFLEX MICROSCOPIC
Bacteria, UA: NONE SEEN
Bilirubin Urine: NEGATIVE
Glucose, UA: NEGATIVE mg/dL
Hgb urine dipstick: NEGATIVE
Ketones, ur: NEGATIVE mg/dL
Nitrite: NEGATIVE
Protein, ur: NEGATIVE mg/dL
Specific Gravity, Urine: 1.01 (ref 1.005–1.030)
pH: 5 (ref 5.0–8.0)

## 2016-06-11 MED ORDER — SODIUM CHLORIDE 0.9 % IV BOLUS (SEPSIS): 1000.0000 mL | Freq: Once | INTRAVENOUS | Status: DC

## 2016-06-11 NOTE — ED Triage Notes (Signed)
Pt BIB [facility] after a fall earlier today. Pt reports her knees "gave out from under me and I started dropping things after the fall." Pt also noticed "shakiness in my arms (that started last night)," called the nurse hotline, and was told to come into the ED. Pt has had similar episode in the past in which her potassium was very low.

## 2016-06-11 NOTE — ED Provider Notes (Signed)
[facility] DEPT Provider Note   CSN: [id number] Arrival date & time: 06/11/16  2112     History   Chief Complaint Chief Complaint  Patient presents with  . Fall    HPI Tiffany [name] is a 56 y.o. female with history diabetes, CHF, seizure disorder, COPD, hypokalemia who presents following fall. Patient reports around 3 PM today she went to stand up and felt both knees give out on her. She caught herself and did not lose consciousness or hit her head. Patient reports since her fall, she has been dropping things. Patient reports feeling similar symptoms when she was admitted for hyperkalemia in the past. She denies any pain. She denies any headache, dizziness, lightheadedness, chest pain, shortness of breath, abdominal pain, nausea, vomiting, urinary symptoms. Patient had a drain placed through her gallbladder around 3 weeks ago. She denies any recent long trips, cancer, new leg pain or swelling. She denies any new medications.  HPI  Past Medical History:  Diagnosis Date  . Anxiety   . Arthritis    "all over" (04/10/2016)  . Asthma   . CHF (congestive heart failure) ([place])   . Chronic bronchitis ([place])   . Chronic kidney disease    "I see a kidney dr." (04/10/2016)  . Cocaine abuse   . Complication of anesthesia    decreased bp, decreased heart rate  . Depression   . Disorder of nervous system   . Emphysema   . GERD (gastroesophageal reflux disease)   . Heart attack ([place]) 1980s  . History of blood transfusion 1994   "couldn't stop bleeding from my period"  . Hyperlipidemia LDL goal <70   . Hypertension   . Incontinence   . Manic depression ([place])   . On home oxygen therapy    "6L; 24/7" (04/10/2016)  . OSA on CPAP    "wear mask sometimes" (04/10/2016)  . Paranoid ([place])    "sometimes; I'm on RX for it" (04/10/2016)  . Pneumonia    "I've had it several times; haven't had it since 06/2015" (04/10/2016)  . Schizophrenia ([place])   . Seasonal allergies   . Seizures ([place])    "don't know what kind; last one was ~ 1 yr ago" (04/10/2016)  . Sinus trouble   . Stroke [facility]) 1980s   denies residual on 04/10/2016  . Type II diabetes mellitus [facility])     Patient Active Problem List   Diagnosis Date Noted  . Overactive bladder 06/07/2016  . Gallstones   . Upper abdominal pain   . Cholecystitis 05/09/2016  . QT prolongation   . OSA and COPD overlap syndrome ([place])   . Generalized weakness   . Arthritis   . Bipolar I disorder ([place])   . Hypokalemia 04/21/2016  . Wheezing 04/12/2016  . (HFpEF) heart failure with preserved ejection fraction ([place])   . Benign essential HTN 03/22/2016  . Rectal bleeding 12/31/2015  . Bipolar affective disorder, mixed, severe, with psychotic behavior ([place]) 11/28/2015  . Cocaine use disorder, severe, dependence ([place]) 11/28/2015  . Hyponatremia 11/25/2015  . AKI (acute kidney injury) ([place]) 11/24/2015  . Blood in sputum   . Chronic diastolic heart failure ([place])   . SOB (shortness of breath)   . Acute on chronic respiratory failure with hypoxia and hypercapnia ([place]) 06/22/2015  . Onychomycosis 01/21/2015  . Nasal congestion 12/17/2014  . Tobacco use disorder 07/22/2014  . COPD (chronic obstructive pulmonary disease) ([place]) 07/08/2014  . Seborrheic keratoses 12/31/2013  . Knee pain, bilateral 01/22/2013  .  Seizure ([place]) 01/04/2013  . Health care maintenance 11/25/2012  . Folliculitis 22/97/9892  . History of kidney stones 06/18/2012  . Chronic pain syndrome 06/18/2012  . Muscle spasm 06/18/2012  . Elevated troponin 04/28/2012  . Allergic reaction 04/07/2012  . Itching 09/06/2011  . HSV infection 08/30/2011  . Dyslipidemia 04/24/2011  . Anemia 04/24/2011  . Leukocytosis 04/24/2011  . Diabetic neuropathy ([place]) 04/24/2011  . Obstructive sleep apnea 10/18/2010  . Asthma 10/18/2010  . Morbid obesity ([place]) 10/18/2010  . Type 2 diabetes mellitus ([place]) 10/18/2010    Past Surgical History:  Procedure Laterality Date  . CESAREAN  SECTION  1997  . HERNIA REPAIR    . IR PERC CHOLECYSTOSTOMY  05/10/2016  . UMBILICAL HERNIA REPAIR  ~ 1963   "that's why I don't have a belly button"  . VAGINAL HYSTERECTOMY      OB History    No data available       Home Medications    Prior to Admission medications   Medication Sig Start Date End Date Taking? Authorizing Provider  acetaminophen (TYLENOL 8 HOUR) 650 MG CR tablet Take 1 tablet (650 mg total) by mouth every 8 (eight) hours as needed for pain. 04/27/16  Yes Archie Patten, MD  albuterol (PROVENTIL) (2.5 MG/3ML) 0.083% nebulizer solution Take 3 mLs (2.5 mg total) by nebulization every 6 (six) hours as needed for wheezing or shortness of breath. 02/18/16  Yes Rumley, Torrance N, DO  Alcohol Swabs (B-D SINGLE USE SWABS REGULAR) PADS CHECK CAPILLARY BLOOD GLUCOSE ONE TIME DAILY 02/03/16  Yes Archie Patten, MD  amLODipine (NORVASC) 5 MG tablet Take 1 tablet (5 mg total) by mouth daily. 05/05/16  Yes Archie Patten, MD  aspirin 81 MG EC tablet Take 1 tablet (81 mg total) by mouth daily. Swallow whole. 12/05/15  Yes Pucilowska, Jolanta B, MD  atorvastatin (LIPITOR) 40 MG tablet Take 1 tablet (40 mg total) by mouth daily. 12/05/15  Yes Pucilowska, Jolanta B, MD  benztropine (COGENTIN) 0.5 MG tablet Take 1 tablet (0.5 mg total) by mouth daily. 05/23/16 05/23/17 Yes Arfeen, Arlyce Harman, MD  Blood Glucose Monitoring Suppl (ACCU-CHEK AVIVA PLUS) w/Device KIT CHECK BLOOD SUGAR THREE TIMES DAILY 12/02/15  Yes Archie Patten, MD  BREO ELLIPTA 100-25 MCG/INH AEPB INHALE 1 PUFF EVERY DAY 02/15/16  Yes Mayo, Pete Pelt, MD  divalproex ([name] ER) 500 MG 24 hr tablet Take 2 tablets (1,000 mg total) by mouth at bedtime. 05/23/16 05/23/17 Yes Arfeen, Arlyce Harman, MD  escitalopram (LEXAPRO) 20 MG tablet Take 1 tablet (20 mg total) by mouth daily. 05/23/16  Yes Arfeen, Arlyce Harman, MD  furosemide (LASIX) 40 MG tablet Take 2 tablets (80 mg total) by mouth 2 (two) times daily. Patient taking differently: Take 80 mg  by mouth 3 (three) times daily.  05/25/16  Yes Archie Patten, MD  gabapentin (NEURONTIN) 600 MG tablet Take 1 tablet (600 mg total) by mouth 3 (three) times daily. 12/05/15  Yes Pucilowska, Jolanta B, MD  glucose blood test strip Use to check blood sugar 3 times daily. 12/05/15  Yes Pucilowska, Jolanta B, MD  metFORMIN (GLUCOPHAGE-XR) 500 MG 24 hr tablet TAKE 1 TABLET TWICE DAILY 03/28/16  Yes Archie Patten, MD  mirtazapine (REMERON) 30 MG tablet Take 1 tablet (30 mg total) by mouth at bedtime. 05/23/16  Yes Arfeen, Arlyce Harman, MD  montelukast (SINGULAIR) 10 MG tablet TAKE 1 TABLET AT BEDTIME 04/24/16  Yes Archie Patten, MD  Multiple Vitamin (  MULTIVITAMIN WITH MINERALS) TABS tablet Take 1 tablet by mouth daily.   Yes [provider]  naproxen (NAPROSYN) 500 MG tablet Take 500 mg by mouth daily.   Yes [provider]  oxybutynin (DITROPAN) 5 MG tablet Take 5 mg by mouth 2 (two) times daily.   Yes [provider]  OXYGEN Inhale 6 L into the lungs daily.   Yes [provider]  potassium chloride (KLOR-CON) 20 MEQ packet TAKE 40 MEQ BY MOUTH 2 (TWO) TIMES DAILY. 05/30/16  Yes Archie Patten, MD  tiotropium (SPIRIVA HANDIHALER) 18 MCG inhalation capsule Place 1 capsule (18 mcg total) into inhaler and inhale daily. 12/05/15  Yes Pucilowska, Jolanta B, MD  ziprasidone (GEODON) 80 MG capsule Take 1 capsule (80 mg total) by mouth at bedtime. 05/23/16  Yes Arfeen, Arlyce Harman, MD    Family History Family History  Problem Relation Age of Onset  . Cancer Father        prostate  . Cancer Mother        lung  . Depression Mother   . Depression Sister   . Anxiety disorder Sister   . Schizophrenia Sister   . Bipolar disorder Sister   . Depression Sister   . Depression Brother   . Heart failure Unknown        cousin    Social History Social History  Substance Use Topics  . Smoking status: Former Smoker    Packs/day: 1.50    Years: 38.00    Types: Cigarettes     Start date: 03/13/1977  . Smokeless tobacco: Never Used  . Alcohol use No     Allergies   Hydroxyzine; Latuda [lurasidone hcl]; Codeine; and Sulfa antibiotics   Review of Systems Review of Systems  Constitutional: Negative for chills and fever.  HENT: Negative for facial swelling and sore throat.   Respiratory: Negative for shortness of breath.   Cardiovascular: Negative for chest pain.  Gastrointestinal: Negative for abdominal pain, nausea and vomiting.  Genitourinary: Negative for dysuria.  Musculoskeletal: Negative for back pain.  Skin: Negative for rash and wound.  Neurological: Positive for weakness (bilateral lower extremities prior to fall). Negative for dizziness, syncope, light-headedness, numbness and headaches.  Psychiatric/Behavioral: The patient is not nervous/anxious.      Physical Exam Updated Vital Signs BP 108/74   Pulse 79   Temp 98.1 F (36.7 C) (Oral)   Resp 18   SpO2 98%   Physical Exam  Constitutional: She appears well-developed and well-nourished. No distress.  Morbidly obese  HENT:  Head: Normocephalic and atraumatic.  Mouth/Throat: Oropharynx is clear and moist. No oropharyngeal exudate.  Eyes: Conjunctivae and EOM are normal. Pupils are equal, round, and reactive to light. Right eye exhibits no discharge. Left eye exhibits no discharge. No scleral icterus.  Neck: Normal range of motion. Neck supple. No thyromegaly present.  Cardiovascular: Normal rate, regular rhythm, normal heart sounds and intact distal pulses.  Exam reveals no gallop and no friction rub.   No murmur heard. Pulmonary/Chest: Effort normal and breath sounds normal. No stridor. No respiratory distress. She has no wheezes. She has no rales.  Abdominal: Soft. Bowel sounds are normal. She exhibits no distension. There is no tenderness. There is no rebound and no guarding.  Musculoskeletal: She exhibits no edema.  Lymphadenopathy:    She has no cervical adenopathy.  Neurological:  [name] alert. Coordination normal.  CN 3-12 intact; normal sensation throughout; 5/5 strength in all 4 extremities; equal bilateral grip  strength; no ataxia on finger to nose  Skin: Skin is warm and dry. No rash noted. [name] not diaphoretic. No pallor.  Psychiatric: She has a normal mood and affect.  Nursing note and vitals reviewed.    ED Treatments / Results  Labs (all labs ordered are listed, but only abnormal results are displayed) Labs Reviewed  BASIC METABOLIC PANEL - Abnormal; Notable for the following:       Result Value   Potassium 3.3 (*)    Glucose, Bld 123 (*)    Creatinine, Ser 1.32 (*)    GFR calc non Af Amer 44 (*)    GFR calc Af Amer 51 (*)    All other components within normal limits  CBC - Abnormal; Notable for the following:    RBC 3.70 (*)    Hemoglobin 10.3 (*)    HCT 35.1 (*)    MCHC 29.3 (*)    RDW 16.4 (*)    All other components within normal limits  URINALYSIS, ROUTINE W REFLEX MICROSCOPIC - Abnormal; Notable for the following:    Leukocytes, UA TRACE (*)    Squamous Epithelial / LPF 6-30 (*)    All other components within normal limits  TROPONIN I  CBG MONITORING, ED    EKG  EKG Interpretation  Date/Time:  Sunday Jun 11 2016 21:23:34 EDT Ventricular Rate:  88 PR Interval:    QRS Duration: 81 QT Interval:  351 QTC Calculation: 427 R Axis:   54 Text Interpretation:  Sinus or ectopic atrial rhythm Low voltage, precordial leads Nonspecific T wave abnormality New since previous tracing Confirmed by Orlie Dakin [id number]) on 06/11/2016 9:30:59 PM       Radiology No results found.  Procedures Procedures (including critical care time)  Medications Ordered in ED Medications  LORazepam (ATIVAN) injection 0.5 mg (0.5 mg Intravenous Given 06/12/16 0451)     Initial Impression / Assessment and Plan / ED Course  I have reviewed the triage vital signs and the nursing notes.  Pertinent labs & imaging results that were available during my  care of the patient were reviewed by me and considered in my medical decision making (see chart for details).     Patient presenting after episode of weakness and resulting feeling like she is dropping things. CBC shows stable chronic anemia, hemoglobin 10.3. BMP shows potassium 3.3, creatinine 1.32. UA shows trace leukocytes, however dirty sample. After waiting several hours for MRI to rule out CVA, MRI tech reported a "catastrophic error in the scanner" and the patient would not able to fit the scanner due to body habitus. CT head will be ordered. At shift change, patient care transferred to Quincy Carnes, PA-C for continued evaluation, follow up of CT head and determination of disposition. Anticipate discharge if CT negative. Patient also evaluated by Dr. Dina Rich who guided the patient's management and agrees with plan.   Final Clinical Impressions(s) / ED Diagnoses   Final diagnoses:  Weakness    New Prescriptions New Prescriptions   No medications on file     Caryl Ada 06/12/16 0076    Merryl Hacker, MD 06/13/16 [phone]

## 2016-06-12 ENCOUNTER — Emergency Department (HOSPITAL_COMMUNITY): Payer: Medicare HMO

## 2016-06-12 DIAGNOSIS — R531 Weakness: Secondary | ICD-10-CM | POA: Diagnosis not present

## 2016-06-12 MED ORDER — LORAZEPAM 2 MG/ML IJ SOLN
0.5000 mg | Freq: Once | INTRAMUSCULAR | Status: AC
  Administered 2016-06-12: 0.5 mg via INTRAVENOUS
  Filled 2016-06-12: qty 1

## 2016-06-12 NOTE — ED Notes (Signed)
Contacted MRI; pt to be transported to MRI within the next 30 minutes

## 2016-06-12 NOTE — ED Provider Notes (Signed)
Assumed care from PA law at shift change. See her note for full details. Briefly 56 year old female here with a fall. Reports her "legs gave out".  She has also been dropping things but from both hands.  Symptoms do not seem to be lateralizing to one side.  Labs overall reassuring.   No apparent deficits noted on exam here.  Plan: initially awaiting MRI, however MRI tech reports patient is too large for scanner.  CT head ordered.  If negative, patient to be discharged home.  Results for orders placed or performed during the hospital encounter of 56/31/49  Basic metabolic panel  Result Value Ref Range   Sodium 140 135 - 145 mmol/L   Potassium 3.3 (L) 3.5 - 5.1 mmol/L   Chloride 103 101 - 111 mmol/L   CO2 29 22 - 32 mmol/L   Glucose, Bld 123 (H) 65 - 99 mg/dL   BUN 17 6 - 20 mg/dL   Creatinine, Ser 1.32 (H) 0.44 - 1.00 mg/dL   Calcium 9.1 8.9 - 10.3 mg/dL   GFR calc non Af Amer 44 (L) >60 mL/min   GFR calc Af Amer 51 (L) >60 mL/min   Anion gap 8 5 - 15  CBC  Result Value Ref Range   WBC 9.5 4.0 - 10.5 K/uL   RBC 3.70 (L) 3.87 - 5.11 MIL/uL   Hemoglobin 10.3 (L) 12.0 - 15.0 g/dL   HCT 35.1 (L) 36.0 - 46.0 %   MCV 94.9 78.0 - 100.0 fL   MCH 27.8 26.0 - 34.0 pg   MCHC 29.3 (L) 30.0 - 36.0 g/dL   RDW 16.4 (H) 11.5 - 15.5 %   Platelets 312 150 - 400 K/uL  Urinalysis, Routine w reflex microscopic  Result Value Ref Range   Color, Urine YELLOW YELLOW   APPearance CLEAR CLEAR   Specific Gravity, Urine 1.010 1.005 - 1.030   pH 5.0 5.0 - 8.0   Glucose, UA NEGATIVE NEGATIVE mg/dL   Hgb urine dipstick NEGATIVE NEGATIVE   Bilirubin Urine NEGATIVE NEGATIVE   Ketones, ur NEGATIVE NEGATIVE mg/dL   Protein, ur NEGATIVE NEGATIVE mg/dL   Nitrite NEGATIVE NEGATIVE   Leukocytes, UA TRACE (A) NEGATIVE   RBC / HPF 0-5 0 - 5 RBC/hpf   WBC, UA 6-30 0 - 5 WBC/hpf   Bacteria, UA NONE SEEN NONE SEEN   Squamous Epithelial / LPF 6-30 (A) NONE SEEN   Mucous PRESENT   Troponin I  Result Value Ref Range    Troponin I <0.03 <0.03 ng/mL     Ct Head Wo Contrast  Result Date: 06/12/2016 CLINICAL DATA:  56 year old female with history of weakness since yesterday. EXAM: CT HEAD WITHOUT CONTRAST TECHNIQUE: Contiguous axial images were obtained from the base of the skull through the vertex without intravenous contrast. COMPARISON:  Head CT 01/04/2013. FINDINGS: Brain: Patchy areas of decreased attenuation are noted throughout the deep and periventricular white matter of the cerebral hemispheres bilaterally, compatible with mild chronic microvascular ischemic disease. No evidence of acute infarction, hemorrhage, hydrocephalus, extra-axial collection or mass lesion/mass effect. Vascular: No hyperdense vessel or unexpected calcification. Skull: Normal. Negative for fracture or focal lesion. Sinuses/Orbits: No acute finding. Other: None. IMPRESSION: 1. No acute intracranial abnormalities. 2. Mild chronic microvascular ischemic changes in the cerebral white matter redemonstrated. Electronically Signed   By: Vinnie Langton M.D.   On: 06/12/2016 07:06   CT head is negative for any acute findings. Given the nature of her symptoms, feel less likely related to  TIA or stroke. Patient remains neurologically intact here. Feel she is stable for discharge home.  Will have her follow-up with her primary care doctor.  Return precautions given for any new/worsening symptoms.   Larene Pickett, PA-C 06/12/16 1102    Mackuen, Fredia Sorrow, MD 06/13/16 1133

## 2016-06-12 NOTE — ED Notes (Signed)
Pt returned from MRI; scan was not completed due to a "catastrophic error" per PA. Pt drowsy but comfortable. Family at bedside. Informed family of delay in imaging.

## 2016-06-12 NOTE — Progress Notes (Signed)
MRI attempted and Rn was notified that pt did not fit in scanner

## 2016-06-12 NOTE — ED Notes (Signed)
Pt taken to CT.

## 2016-06-12 NOTE — Discharge Instructions (Signed)
Your CT scan and labs today were normal. Recommend to follow-up with your primary care doctor. Return here for new concerns.

## 2016-06-12 NOTE — ED Notes (Signed)
Pt taken to MRI  

## 2016-06-12 NOTE — ED Notes (Signed)
Pt next in line for MRI

## 2016-06-13 ENCOUNTER — Telehealth: Payer: Self-pay | Admitting: Family Medicine

## 2016-06-13 ENCOUNTER — Other Ambulatory Visit: Payer: Self-pay | Admitting: *Deleted

## 2016-06-13 MED ORDER — ATORVASTATIN CALCIUM 40 MG PO TABS: 40.0000 mg | ORAL_TABLET | Freq: Every day | ORAL | 0 refills | Status: DC

## 2016-06-13 MED ORDER — ACETAMINOPHEN ER 650 MG PO TBCR: 650.0000 mg | EXTENDED_RELEASE_TABLET | Freq: Three times a day (TID) | ORAL | 0 refills | Status: DC | PRN

## 2016-06-13 MED ORDER — FLUTICASONE FUROATE-VILANTEROL 100-25 MCG/INH IN AEPB: INHALATION_SPRAY | RESPIRATORY_TRACT | 0 refills | Status: DC

## 2016-06-13 MED ORDER — ALBUTEROL SULFATE (2.5 MG/3ML) 0.083% IN NEBU: 2.5000 mg | INHALATION_SOLUTION | Freq: Four times a day (QID) | RESPIRATORY_TRACT | 1 refills | Status: DC | PRN

## 2016-06-13 MED ORDER — ASPIRIN 81 MG PO TBEC: 81.0000 mg | DELAYED_RELEASE_TABLET | Freq: Every day | ORAL | 0 refills | Status: DC

## 2016-06-13 MED ORDER — TIOTROPIUM BROMIDE MONOHYDRATE 18 MCG IN CAPS: 18.0000 ug | ORAL_CAPSULE | Freq: Every day | RESPIRATORY_TRACT | 1 refills | Status: DC

## 2016-06-13 MED ORDER — MONTELUKAST SODIUM 10 MG PO TABS: 10.0000 mg | ORAL_TABLET | Freq: Every day | ORAL | 1 refills | Status: DC

## 2016-06-13 MED ORDER — AMLODIPINE BESYLATE 5 MG PO TABS: 5.0000 mg | ORAL_TABLET | Freq: Every day | ORAL | 0 refills | Status: DC

## 2016-06-13 MED ORDER — FUROSEMIDE 40 MG PO TABS: 80.0000 mg | ORAL_TABLET | Freq: Two times a day (BID) | ORAL | 0 refills | Status: DC

## 2016-06-13 MED ORDER — GABAPENTIN 600 MG PO TABS: 600.0000 mg | ORAL_TABLET | Freq: Three times a day (TID) | ORAL | 1 refills | Status: DC

## 2016-06-13 NOTE — Patient Outreach (Signed)
[facility]) Care Management  06/13/2016  Tiffany [name] 07-Feb-1960 [id number]   Noted that member was seen in emergency department over the weekend for a fall, call placed to follow up on ED visit.  No answer, HIPAA compliant voice message left.  Will await call back, will follow up within a week if no call back.  [name], [place], MSN [place] [phone]

## 2016-06-13 NOTE — Telephone Encounter (Signed)
Late note. Spoke with [facility] pharmacist and confirmed pharmacy was [facility]. Discussed that the pt's psychiatrist prescribes some of her medications. Additionally noted that pt's potassium and Lasix are often changed so she may need additionally bottles of this medication in case we need to increase.  Pharmacist to call the pt's psychiatrist. Also stated we will just not put Lasix and potassium in the pill container, she'll get bottles of these. She stated I should send them all to the pharmacy and she'd discuss packaging with them. All Rx given by our clinic were sent in this AM.   On her medication list I am not including oxybutynin as this was held due to an AKI, naproxen as she was advised to stop this, metformin as her A1c has been at goal.   Kathrine Cords, MD

## 2016-06-14 ENCOUNTER — Ambulatory Visit (INDEPENDENT_AMBULATORY_CARE_PROVIDER_SITE_OTHER): Payer: Medicare HMO | Admitting: Family Medicine

## 2016-06-14 ENCOUNTER — Other Ambulatory Visit: Payer: Self-pay | Admitting: Surgery

## 2016-06-14 ENCOUNTER — Encounter: Payer: Self-pay | Admitting: Family Medicine

## 2016-06-14 VITALS — BP 114/70 | HR 86 | Temp 98.3°F | Ht 62.0 in | Wt 292.2 lb

## 2016-06-14 DIAGNOSIS — N39498 Other specified urinary incontinence: Secondary | ICD-10-CM

## 2016-06-14 DIAGNOSIS — J9621 Acute and chronic respiratory failure with hypoxia: Secondary | ICD-10-CM | POA: Diagnosis not present

## 2016-06-14 DIAGNOSIS — J9622 Acute and chronic respiratory failure with hypercapnia: Secondary | ICD-10-CM | POA: Diagnosis not present

## 2016-06-14 DIAGNOSIS — N3281 Overactive bladder: Secondary | ICD-10-CM

## 2016-06-14 DIAGNOSIS — N179 Acute kidney failure, unspecified: Secondary | ICD-10-CM | POA: Diagnosis not present

## 2016-06-14 DIAGNOSIS — K819 Cholecystitis, unspecified: Secondary | ICD-10-CM

## 2016-06-14 NOTE — Assessment & Plan Note (Signed)
Renal function is been worsening over the last several weeks to 1.4/1.3. I suspect this is due to the increased diuresis. I do believe we may be of the patient's dry weight so I advised her to cut back on the Lasix. -We will go to Lasix 80 mg twice a day -Advised the patient to stop taking oxybutynin (this was discontinued several hospitalizations ago due to an AKI) -If the patient's appointment next week, will repeat a BMP

## 2016-06-14 NOTE — Progress Notes (Signed)
  Subjective: CC: f/u breathing HPI: Patient is a 56 y.o. female with a past medical history of CKD 2, diastolic heart failure, COPD, type 2 diabetes, bipolar disorder presenting to clinic today for a f/u on breathing.  Breathing: She feels breathing is terrible today as she has DOE. She notes DOE walking from waiting room to exam room.  This has been going on for the last 2 days.  O2 stable: lowest 86 but changes up to 98%.  Still on 6 L nasal cannula. Patient weighs herself in the AM and it is 294 consistently.  Feels she has stable lower extremity edema. Denies any wheezing. No chest pain. No cough, fevers, chills. No upper respiratory symptoms. [name] unsure when she sees pulmonology again. At her last visit on May 22, she was advised to continue taking Lasix 80 mg 3 times a day. This is what she is currently still taking.  ED visit: She seen in the ED on Saturday due to weakness. She states that she felt her legs gave out and she fell. She had imaging at that time, CT of her head as she could not fit in the MRI, which was negative. No injury from the fall. She had low normal levels of potassium at that time. Her creatinine was noted to be up at 1.34 The patient states she believes her symptoms may be due to the decrease in benztropine. She notes that all of this is been going on since that was decreased due to dry mouth. She states she will discuss this with her psychiatrist in further detail.  Urinary incontinence: Over the last month, the patient is noted urinary incontinence at nighttime, and sometimes overflow incontinence. During her hospitalization in April, she was advised to discontinue oxybutynin, but since has restarted due to incontinence.   Social History: still smoke free!   ROS: All other systems reviewed and are negative.  Past Medical History Patient Active Problem List   Diagnosis Date Noted  . Overactive bladder 06/07/2016  . Gallstones   . Upper  abdominal pain   . Cholecystitis 05/09/2016  . QT prolongation   . OSA and COPD overlap syndrome (HCC)   . Generalized weakness   . Arthritis   . Bipolar I disorder (HCC)   . Hypokalemia 04/21/2016  . Wheezing 04/12/2016  . (HFpEF) heart failure with preserved ejection fraction (HCC)   . Benign essential HTN 03/22/2016  . Rectal bleeding 12/31/2015  . Bipolar affective disorder, mixed, severe, with psychotic behavior (HCC) 11/28/2015  . Cocaine use disorder, severe, dependence (HCC) 11/28/2015  . Hyponatremia 11/25/2015  . AKI (acute kidney injury) (HCC) 11/24/2015  . Blood in sputum   . Chronic diastolic heart failure (HCC)   . SOB (shortness of breath)   . Acute on chronic respiratory failure with hypoxia and hypercapnia (HCC) 06/22/2015  . Onychomycosis 01/21/2015  . Nasal congestion 12/17/2014  . Tobacco use disorder 07/22/2014  . COPD (chronic obstructive pulmonary disease) (HCC) 07/08/2014  . Seborrheic keratoses 12/31/2013  . Knee pain, bilateral 01/22/2013  . Seizure (HCC) 01/04/2013  . Health care maintenance 11/25/2012  . Folliculitis 11/25/2012  . History of kidney stones 06/18/2012  . Chronic pain syndrome 06/18/2012  . Muscle spasm 06/18/2012  . Elevated troponin 04/28/2012  . Allergic reaction 04/07/2012  . Itching 09/06/2011  . HSV infection 08/30/2011  . Dyslipidemia 04/24/2011  . Anemia 04/24/2011  . Leukocytosis 04/24/2011  . Diabetic neuropathy (HCC) 04/24/2011  . Obstructive sleep apnea 10/18/2010  .   Asthma 10/18/2010  . Morbid obesity ([place]) 10/18/2010  . Type 2 diabetes mellitus ([place]) 10/18/2010    Medications- reviewed and updated Current Outpatient Prescriptions  Medication Sig Dispense Refill  . acetaminophen (TYLENOL 8 HOUR) 650 MG CR tablet Take 1 tablet (650 mg total) by mouth every 8 (eight) hours as needed for pain. 60 tablet 0  . albuterol (PROVENTIL) (2.5 MG/3ML) 0.083% nebulizer solution Take 3 mLs (2.5 mg total) by nebulization every  6 (six) hours as needed for wheezing or shortness of breath. 150 mL 1  . Alcohol Swabs (B-D SINGLE USE SWABS REGULAR) PADS CHECK CAPILLARY BLOOD GLUCOSE ONE TIME DAILY 100 each 3  . amLODipine (NORVASC) 5 MG tablet Take 1 tablet (5 mg total) by mouth daily. 90 tablet 0  . aspirin 81 MG EC tablet Take 1 tablet (81 mg total) by mouth daily. Swallow whole. 90 tablet 0  . atorvastatin (LIPITOR) 40 MG tablet Take 1 tablet (40 mg total) by mouth daily. 90 tablet 0  . benztropine (COGENTIN) 0.5 MG tablet Take 1 tablet (0.5 mg total) by mouth daily. 30 tablet 1  . Blood Glucose Monitoring Suppl (ACCU-CHEK AVIVA PLUS) w/Device KIT CHECK BLOOD SUGAR THREE TIMES DAILY 1 kit 0  . divalproex ([name] ER) 500 MG 24 hr tablet Take 2 tablets (1,000 mg total) by mouth at bedtime. 60 tablet 1  . escitalopram (LEXAPRO) 20 MG tablet Take 1 tablet (20 mg total) by mouth daily. 30 tablet 1  . fluticasone furoate-vilanterol (BREO ELLIPTA) 100-25 MCG/INH AEPB INHALE 1 PUFF EVERY DAY 180 each 0  . furosemide (LASIX) 40 MG tablet Take 2 tablets (80 mg total) by mouth 2 (two) times daily. 360 tablet 0  . gabapentin (NEURONTIN) 600 MG tablet Take 1 tablet (600 mg total) by mouth 3 (three) times daily. 270 tablet 1  . glucose blood test strip Use to check blood sugar 3 times daily. 100 each 12  . mirtazapine (REMERON) 30 MG tablet Take 1 tablet (30 mg total) by mouth at bedtime. 30 tablet 1  . montelukast (SINGULAIR) 10 MG tablet Take 1 tablet (10 mg total) by mouth at bedtime. 90 tablet 1  . Multiple Vitamin (MULTIVITAMIN WITH MINERALS) TABS tablet Take 1 tablet by mouth daily.    . OXYGEN Inhale 6 L into the lungs daily.    [name] tiotropium (SPIRIVA HANDIHALER) 18 MCG inhalation capsule Place 1 capsule (18 mcg total) into inhaler and inhale daily. 90 capsule 1  . ziprasidone (GEODON) 80 MG capsule Take 1 capsule (80 mg total) by mouth at bedtime. 30 capsule 1   No current facility-administered medications for this visit.      Objective: Office vital signs reviewed. BP 114/70   Pulse 86   Temp 98.3 F (36.8 C) (Oral)   Ht 5' 2" (1.575 m)   Wt 292 lb 3.2 oz (132.5 kg)   SpO2 90% Comment: went from 89 to 94 and stayed in these numbers _0   BMI 53.44 kg/m  06/06/16:  292  Physical Examination:  General: Awake, alert, well- nourished, NAD Cardio: RRR, no m/r/g noted. Trace pitting edema. Difficult to assess JVD.  Pulm: No increased WOB.  CTAB, without wheezes, rhonchi or crackles noted. On 6L [place].  MSK: normal to inspection with no tremor. Good hand grip/symmetric. Normal DRTs. No clonus. No spasticity noted.  Cholecystostomy tube in place with a small amount of granulation tissue around the insertion site without erythema. Small amount of brown drainage noted on the gauze. Draining  bilious fluid in the bag   Assessment/Plan:    Orders Placed This Encounter  Procedures  . Ambulatory referral to Urology    Referral Priority:   Routine    Referral Type:   Consultation    Referral Reason:   Specialty Services Required    Requested Specialty:   Urology    Number of Visits Requested:   1    No orders of the defined types were placed in this encounter.    S.  PGY-3, [facility]  

## 2016-06-14 NOTE — Telephone Encounter (Signed)
Patient should not be on metolazone. Medication not refilled.   Archie Patten, MD [facility] Family Medicine Resident  06/14/2016, 8:27 AM

## 2016-06-14 NOTE — Assessment & Plan Note (Signed)
Patient continues to endorse intermittent shortness of breath. I'm unsure if it secondary to her history of heart failure given her weight has been stable over the last month. I suggested the patient follow up sooner rather than later with her pulmonologist. Also advised her to use albuterol as needed. Much of this may also be secondary to her habitus and deconditioning. -Continue with home oxygen -Continue with Lasix 80 mg twice a day, I think to 92 may be her dry weight. -Patient notes fear of going too long without being evaluated at clinic, she will follow-up with me on Monday or Tuesday of next week. Our goal is to try to avoid frequent ED visits with this patient.

## 2016-06-14 NOTE — Assessment & Plan Note (Signed)
Patient was very still oxybutynin prescribed by urology and continued by our clinic. This has since been held due to her AKI. Patient is very concerned about discontinuation of this -We'll refer back to urology.

## 2016-06-14 NOTE — Patient Instructions (Signed)
He was good to see you again. Decrease your Lasix to 80 mg (2 - 40 mg tablets) twice daily. Continue 40 mEq (2- 20 mEq tablets) twice daily.  Stop taking the oxybutynin as it is not good for your kidneys I have referred you to urology to assess your urinary incontinence   Follow up with me on Monday or Tuesday of next week. If needed, let the front office know that you can be scheduled in a same day slot in my continuity clinic.

## 2016-06-15 ENCOUNTER — Other Ambulatory Visit: Payer: Self-pay

## 2016-06-15 ENCOUNTER — Ambulatory Visit (HOSPITAL_COMMUNITY): Payer: Self-pay | Admitting: Clinical

## 2016-06-16 ENCOUNTER — Other Ambulatory Visit: Payer: Self-pay | Admitting: *Deleted

## 2016-06-16 ENCOUNTER — Telehealth (HOSPITAL_COMMUNITY): Payer: Self-pay

## 2016-06-16 DIAGNOSIS — I5032 Chronic diastolic (congestive) heart failure: Secondary | ICD-10-CM | POA: Diagnosis not present

## 2016-06-16 DIAGNOSIS — N189 Chronic kidney disease, unspecified: Secondary | ICD-10-CM | POA: Diagnosis not present

## 2016-06-16 DIAGNOSIS — I130 Hypertensive heart and chronic kidney disease with heart failure and stage 1 through stage 4 chronic kidney disease, or unspecified chronic kidney disease: Secondary | ICD-10-CM | POA: Diagnosis not present

## 2016-06-16 DIAGNOSIS — F3164 Bipolar disorder, current episode mixed, severe, with psychotic features: Secondary | ICD-10-CM | POA: Diagnosis not present

## 2016-06-16 DIAGNOSIS — J449 Chronic obstructive pulmonary disease, unspecified: Secondary | ICD-10-CM | POA: Diagnosis not present

## 2016-06-16 DIAGNOSIS — E119 Type 2 diabetes mellitus without complications: Secondary | ICD-10-CM | POA: Diagnosis not present

## 2016-06-16 DIAGNOSIS — K819 Cholecystitis, unspecified: Secondary | ICD-10-CM | POA: Diagnosis not present

## 2016-06-16 DIAGNOSIS — F1420 Cocaine dependence, uncomplicated: Secondary | ICD-10-CM | POA: Diagnosis not present

## 2016-06-16 DIAGNOSIS — Z4682 Encounter for fitting and adjustment of non-vascular catheter: Secondary | ICD-10-CM | POA: Diagnosis not present

## 2016-06-16 NOTE — Telephone Encounter (Signed)
Patient left message saying that she was out of her Cogentin because you told her she could take 1 or 2 pills and she was taking 2 so now she is out. Please review and advise, thank you

## 2016-06-16 NOTE — Patient Outreach (Signed)
[place] [facility]) Care Management  06/16/2016  Tiffany [name] Dec 01, 1960 [id number]   Weekly transition of care call placed to member, no answer.  HIPAA compliant voice message left.  Will await call back, if no call back, will follow up next week.  [name], [place], MSN [place] [phone]

## 2016-06-18 ENCOUNTER — Other Ambulatory Visit (HOSPITAL_COMMUNITY): Payer: Self-pay | Admitting: Psychiatry

## 2016-06-18 DIAGNOSIS — F200 Paranoid schizophrenia: Secondary | ICD-10-CM

## 2016-06-19 ENCOUNTER — Other Ambulatory Visit (HOSPITAL_COMMUNITY): Payer: Self-pay | Admitting: Psychiatry

## 2016-06-19 ENCOUNTER — Other Ambulatory Visit: Payer: Self-pay | Admitting: *Deleted

## 2016-06-19 DIAGNOSIS — F200 Paranoid schizophrenia: Secondary | ICD-10-CM

## 2016-06-19 MED ORDER — BENZTROPINE MESYLATE 0.5 MG PO TABS: 0.5000 mg | ORAL_TABLET | Freq: Two times a day (BID) | ORAL | 0 refills | Status: DC

## 2016-06-19 NOTE — Patient Outreach (Signed)
[place] [facility]) Care Management  06/19/2016  Tiffany [name] [birth year] [id number]   Voice message received from member regarding missed call last week.  Call placed back to member, she report she has been doing well, a bit overwhelmed with the different home visits (has home health nursing as well as PT).  She confirms that she did follow up with the radiologist, percutaneous drain remains in place, has another follow up next week.  She denies signs/symptoms of infection.  She denies any shortness of breath at this time, report compliance with all medications.  She has follow up appointment with primary MD tomorrow and appointment with dietician next week to discuss proper diabetic and heart failure diet.  [name] inconsistent with daily weights, denies weight loss.  Re-educated on heart failure management (low salt diet, daily weights).  She verbalizes understanding, will follow up next week with home visit.  [name], [place], MSN [place] [phone]

## 2016-06-20 ENCOUNTER — Ambulatory Visit: Payer: Self-pay | Admitting: *Deleted

## 2016-06-20 ENCOUNTER — Other Ambulatory Visit: Payer: Self-pay

## 2016-06-20 ENCOUNTER — Telehealth: Payer: Self-pay | Admitting: Cardiology

## 2016-06-20 ENCOUNTER — Ambulatory Visit (INDEPENDENT_AMBULATORY_CARE_PROVIDER_SITE_OTHER): Payer: Medicare HMO | Admitting: Family Medicine

## 2016-06-20 ENCOUNTER — Encounter: Payer: Self-pay | Admitting: Family Medicine

## 2016-06-20 VITALS — BP 119/70 | HR 87 | Temp 98.2°F | Ht 62.0 in | Wt 299.4 lb

## 2016-06-20 DIAGNOSIS — J9622 Acute and chronic respiratory failure with hypercapnia: Secondary | ICD-10-CM | POA: Diagnosis not present

## 2016-06-20 DIAGNOSIS — I10 Essential (primary) hypertension: Secondary | ICD-10-CM | POA: Diagnosis not present

## 2016-06-20 DIAGNOSIS — J9621 Acute and chronic respiratory failure with hypoxia: Secondary | ICD-10-CM

## 2016-06-20 DIAGNOSIS — G4733 Obstructive sleep apnea (adult) (pediatric): Secondary | ICD-10-CM

## 2016-06-20 NOTE — Telephone Encounter (Signed)
Patient called to scheduled follow-up for fluctuating body weight after seeing her PCP.  She states her weight fluctuates 10 pounds or so sometimes and has for the last month. She states she weighs herself daily on the same scale. She states she always has shortness of breath on exertion and it doesn't seem any worse. She reports mild swelling around her chest- she states her "chest feels bigger." [name] taking her medications as directed. Reiterated to patient to weigh daily in the morning before breakfast with no clothes/the same amount of clothes. She will check BP and daily weights and bring to OV scheduled early next week with APP. She will call if weight increases or swelling/SOB worsens prior to appointment. She was grateful for call.

## 2016-06-20 NOTE — Patient Instructions (Signed)
I want you to follow up with Dr. Marlou Porch, the cardiologist.  His number is [phone]  Decrease Lasix to 80mg  (2- 40mg  tablets) TWICE daily. Continue the potassium 84meQ twice daily. Continue to check your weights daily, keep a log. Even check your weights on days you are going to the doctor  Follow up with me next week.

## 2016-06-20 NOTE — Telephone Encounter (Signed)
Patient calling, states that she was told to follow up with cardiologist because her weight is fluctuating. Please call to discuss. Thanks.

## 2016-06-20 NOTE — Assessment & Plan Note (Signed)
Patient initially states her breathing is good, but later notes increase in weight and possible worsening of her breathing status. It is difficult to assess whether this is due to her history of heart failure (she's been previously hospitalized to do this) versus habitus versus severe COPD. The patient was last seen by [name] in February 2018. Echo revealed a normal ejection fraction with grade 2 diastolic dysfunction. She was advised to follow up Dr. Marlou Porch in 6 months.  Patient with significant weight fluctuations concerning for fluid retention, however pt has not appeared significantly volume overloaded with previous hospitalizations therefore I am unsure how much I can trust the fact she appears evoluemic on my exam today. Additionally, I don't want to be chasing a number instead of treating the patient however I cannot explain it. Fortunately, lungs clear on my exam today and O2 sats up to 96% on her home 6L [place].  - advised pt to f/u with Dr. Marlou Porch early to see if they can add anything further - decrease Lasix to 80mg  BID as her renal functioning was worsening previously. - continue potassium 41mEq BID for now  - BMET today  - f/u with me in 1 week- will follow closely given frequent ED visits and hospitalizations recently.

## 2016-06-20 NOTE — Progress Notes (Signed)
Subjective: CC: f/u breathing HPI: Patient is a 56 y.o. female with a past medical history of CKD 2, diastolic heart failure, COPD, type 2 diabetes, bipolar disorder presenting to clinic today for a f/u on breathing.  Breathing: She feels breathing is ok. Stable since our last visit  O2 bouncing around: 83-98% Still on 6 L nasal cannula. Patient weighs herself in the AM and it is 294 consistently.  Feels she has stable lower extremity edema. Denies any wheezing. No chest pain. No cough, fevers, chills. No upper respiratory symptoms. [name] unsure when she sees pulmonology again. Weighing herself in the AM: Friday 292lbs, Saturday 306lb, Sunday 300lb, Monday didn't weigh, Tuesday didn't weigh   At her visit, she was advised to decrease Lasix 66m to BID, however she's still doing it TID; states she did not recall me telling her this and did not look at her AVS. Taking potassium 496m BID.   Social History: still smoke free!   ROS: All other systems reviewed and are negative.  Past Medical History Patient Active Problem List   Diagnosis Date Noted  . Overactive bladder 06/07/2016  . Gallstones   . Upper abdominal pain   . Cholecystitis 05/09/2016  . QT prolongation   . OSA and COPD overlap syndrome (HCAlbemarle  . Generalized weakness   . Arthritis   . Bipolar I disorder (HCRio [place]  . Hypokalemia 04/21/2016  . Wheezing 04/12/2016  . (HFpEF) heart failure with preserved ejection fraction (HCWellston  . Benign essential HTN 03/22/2016  . Rectal bleeding 12/31/2015  . Bipolar affective disorder, mixed, severe, with psychotic behavior (HCGrady11/12/2015  . Cocaine use disorder, severe, dependence (HCOdum11/12/2015  . Hyponatremia 11/25/2015  . AKI (acute kidney injury) (HCWaterloo11/08/2015  . Blood in sputum   . Chronic diastolic heart failure (HCMason  . SOB (shortness of breath)   . Acute on chronic respiratory failure with hypoxia and hypercapnia (HCBemidji06/06/2015  . Onychomycosis  01/21/2015  . Nasal congestion 12/17/2014  . Tobacco use disorder 07/22/2014  . COPD (chronic obstructive pulmonary disease) (HCNacogdoches06/22/2016  . Seborrheic keratoses 12/31/2013  . Knee pain, bilateral 01/22/2013  . Seizure (HCHammondsport12/20/2014  . Health care maintenance 11/25/2012  . Folliculitis 1183/15/1761. History of kidney stones 06/18/2012  . Chronic pain syndrome 06/18/2012  . Muscle spasm 06/18/2012  . Elevated troponin 04/28/2012  . Allergic reaction 04/07/2012  . Itching 09/06/2011  . HSV infection 08/30/2011  . Dyslipidemia 04/24/2011  . Anemia 04/24/2011  . Leukocytosis 04/24/2011  . Diabetic neuropathy (HCParksley04/08/2011  . Obstructive sleep apnea 10/18/2010  . Asthma 10/18/2010  . Morbid obesity (HCQuintana10/02/2010  . Type 2 diabetes mellitus (HCBrown City10/02/2010    Medications- reviewed and updated Current Outpatient Prescriptions  Medication Sig Dispense Refill  . acetaminophen (TYLENOL 8 HOUR) 650 MG CR tablet Take 1 tablet (650 mg total) by mouth every 8 (eight) hours as needed for pain. 60 tablet 0  . albuterol (PROVENTIL) (2.5 MG/3ML) 0.083% nebulizer solution Take 3 mLs (2.5 mg total) by nebulization every 6 (six) hours as needed for wheezing or shortness of breath. 150 mL 1  . Alcohol Swabs (B-D SINGLE USE SWABS REGULAR) PADS CHECK CAPILLARY BLOOD GLUCOSE ONE TIME DAILY 100 each 3  . amLODipine (NORVASC) 5 MG tablet Take 1 tablet (5 mg total) by mouth daily. 90 tablet 0  . aspirin 81 MG EC tablet Take 1 tablet (81 mg total) by mouth daily. Swallow whole. 90[place]  tablet 0  . atorvastatin (LIPITOR) 40 MG tablet Take 1 tablet (40 mg total) by mouth daily. 90 tablet 0  . benztropine (COGENTIN) 0.5 MG tablet Take 1 tablet (0.5 mg total) by mouth 2 (two) times daily. 60 tablet 0  . Blood Glucose Monitoring Suppl (ACCU-CHEK AVIVA PLUS) w/Device KIT CHECK BLOOD SUGAR THREE TIMES DAILY 1 kit 0  . divalproex ([name] ER) 500 MG 24 hr tablet Take 2 tablets (1,000 mg total) by mouth at  bedtime. 60 tablet 1  . escitalopram (LEXAPRO) 20 MG tablet Take 1 tablet (20 mg total) by mouth daily. 30 tablet 1  . fluticasone furoate-vilanterol (BREO ELLIPTA) 100-25 MCG/INH AEPB INHALE 1 PUFF EVERY DAY 180 each 0  . furosemide (LASIX) 40 MG tablet Take 2 tablets (80 mg total) by mouth 2 (two) times daily. 360 tablet 0  . gabapentin (NEURONTIN) 600 MG tablet Take 1 tablet (600 mg total) by mouth 3 (three) times daily. 270 tablet 1  . glucose blood test strip Use to check blood sugar 3 times daily. 100 each 12  . mirtazapine (REMERON) 30 MG tablet Take 1 tablet (30 mg total) by mouth at bedtime. 30 tablet 1  . montelukast (SINGULAIR) 10 MG tablet Take 1 tablet (10 mg total) by mouth at bedtime. 90 tablet 1  . Multiple Vitamin (MULTIVITAMIN WITH MINERALS) TABS tablet Take 1 tablet by mouth daily.    . OXYGEN Inhale 6 L into the lungs daily.    [name] tiotropium (SPIRIVA HANDIHALER) 18 MCG inhalation capsule Place 1 capsule (18 mcg total) into inhaler and inhale daily. 90 capsule 1  . ziprasidone (GEODON) 80 MG capsule Take 1 capsule (80 mg total) by mouth at bedtime. 30 capsule 1   No current facility-administered medications for this visit.     Objective: Office vital signs reviewed. BP 119/70 (BP Location: Left Wrist, Patient Position: Sitting, Cuff Size: Large)   Pulse 87   Temp 98.2 F (36.8 C) (Oral)   Ht '5\' 2"'  (1.575 m)   Wt 299 lb 6.4 oz (135.8 kg)   SpO2 96%   BMI 54.76 kg/m  06/06/16:  292 06/14/16: 292  Physical Examination:  General: Awake, alert, well- nourished, NAD Cardio: RRR, no m/r/g noted. Trace pitting edema. Difficult to assess JVD.  Pulm: No increased WOB.  CTAB, without wheezes, rhonchi or crackles noted. On 6L [place].  MSK: normal to inspection with no tremor. Good hand grip/symmetric. Normal DRTs. No clonus. No spasticity noted.  Abdomen: Cholecystostomy tube in place with a small amount of granulation tissue around the insertion site without erythema. Small amount  of brown drainage noted on the gauze. Draining bilious fluid in the bag   Assessment/Plan:    Orders Placed This Encounter  Procedures  . CMP14+EGFR    No orders of the defined types were placed in this encounter.   Archie Patten PGY-3, [place]

## 2016-06-21 ENCOUNTER — Telehealth: Payer: Self-pay | Admitting: Family Medicine

## 2016-06-21 DIAGNOSIS — I130 Hypertensive heart and chronic kidney disease with heart failure and stage 1 through stage 4 chronic kidney disease, or unspecified chronic kidney disease: Secondary | ICD-10-CM | POA: Diagnosis not present

## 2016-06-21 DIAGNOSIS — F3164 Bipolar disorder, current episode mixed, severe, with psychotic features: Secondary | ICD-10-CM | POA: Diagnosis not present

## 2016-06-21 DIAGNOSIS — J449 Chronic obstructive pulmonary disease, unspecified: Secondary | ICD-10-CM | POA: Diagnosis not present

## 2016-06-21 DIAGNOSIS — Z4682 Encounter for fitting and adjustment of non-vascular catheter: Secondary | ICD-10-CM | POA: Diagnosis not present

## 2016-06-21 DIAGNOSIS — N189 Chronic kidney disease, unspecified: Secondary | ICD-10-CM | POA: Diagnosis not present

## 2016-06-21 DIAGNOSIS — I5032 Chronic diastolic (congestive) heart failure: Secondary | ICD-10-CM | POA: Diagnosis not present

## 2016-06-21 DIAGNOSIS — F1420 Cocaine dependence, uncomplicated: Secondary | ICD-10-CM | POA: Diagnosis not present

## 2016-06-21 DIAGNOSIS — E119 Type 2 diabetes mellitus without complications: Secondary | ICD-10-CM | POA: Diagnosis not present

## 2016-06-21 DIAGNOSIS — K819 Cholecystitis, unspecified: Secondary | ICD-10-CM | POA: Diagnosis not present

## 2016-06-21 LAB — CMP14+EGFR
ALT: 11 IU/L (ref 0–32)
AST: 14 IU/L (ref 0–40)
Albumin/Globulin Ratio: 1.2 (ref 1.2–2.2)
Albumin: 3.7 g/dL (ref 3.5–5.5)
Alkaline Phosphatase: 130 IU/L — ABNORMAL HIGH (ref 39–117)
BUN/Creatinine Ratio: 24 — ABNORMAL HIGH (ref 9–23)
BUN: 25 mg/dL — ABNORMAL HIGH (ref 6–24)
Bilirubin Total: <0.2 mg/dL (ref 0.0–1.2)
CO2: 28 mmol/L (ref 18–29)
Calcium: 9.5 mg/dL (ref 8.7–10.2)
Chloride: 99 mmol/L (ref 96–106)
Creatinine, Ser: 1.03 mg/dL — ABNORMAL HIGH (ref 0.57–1.00)
GFR calc Af Amer: 70 mL/min/{1.73_m2} (ref >59)
GFR calc non Af Amer: 60 mL/min/{1.73_m2} (ref >59)
Globulin, Total: 3 g/dL (ref 1.5–4.5)
Glucose: 89 mg/dL (ref 65–99)
Potassium: 4.4 mmol/L (ref 3.5–5.2)
Sodium: 144 mmol/L (ref 134–144)
Total Protein: 6.7 g/dL (ref 6.0–8.5)

## 2016-06-21 LAB — SPECIMEN STATUS

## 2016-06-21 NOTE — Telephone Encounter (Signed)
Donita called from [facility] and is requesting palliative care orders for the patient who has agreed. Please call Donita with verbal orders at [phone], jw

## 2016-06-22 NOTE — Telephone Encounter (Signed)
Verbal orders given for palliative care consult, I believe that is a wonderful idea for this patient.  Archie Patten, MD [facility] Family Medicine Resident  06/22/2016, 8:12 AM

## 2016-06-23 ENCOUNTER — Encounter: Payer: Self-pay | Admitting: Family Medicine

## 2016-06-24 DIAGNOSIS — R0602 Shortness of breath: Secondary | ICD-10-CM | POA: Diagnosis not present

## 2016-06-24 DIAGNOSIS — J440 Chronic obstructive pulmonary disease with acute lower respiratory infection: Secondary | ICD-10-CM | POA: Diagnosis not present

## 2016-06-24 DIAGNOSIS — G4733 Obstructive sleep apnea (adult) (pediatric): Secondary | ICD-10-CM | POA: Diagnosis not present

## 2016-06-24 DIAGNOSIS — R062 Wheezing: Secondary | ICD-10-CM | POA: Diagnosis not present

## 2016-06-24 DIAGNOSIS — J45909 Unspecified asthma, uncomplicated: Secondary | ICD-10-CM | POA: Diagnosis not present

## 2016-06-24 DIAGNOSIS — J189 Pneumonia, unspecified organism: Secondary | ICD-10-CM | POA: Diagnosis not present

## 2016-06-26 ENCOUNTER — Encounter: Payer: Self-pay | Admitting: Cardiology

## 2016-06-26 ENCOUNTER — Ambulatory Visit (INDEPENDENT_AMBULATORY_CARE_PROVIDER_SITE_OTHER): Payer: Medicare HMO | Admitting: Cardiology

## 2016-06-26 VITALS — BP 106/64 | HR 86 | Ht 62.0 in | Wt 302.0 lb

## 2016-06-26 DIAGNOSIS — I5033 Acute on chronic diastolic (congestive) heart failure: Secondary | ICD-10-CM | POA: Diagnosis not present

## 2016-06-26 DIAGNOSIS — I5032 Chronic diastolic (congestive) heart failure: Secondary | ICD-10-CM | POA: Diagnosis not present

## 2016-06-26 LAB — BASIC METABOLIC PANEL
BUN/Creatinine Ratio: 16 (ref 9–23)
BUN: 23 mg/dL (ref 6–24)
CO2: 34 mmol/L — ABNORMAL HIGH (ref 20–29)
Calcium: 9.3 mg/dL (ref 8.7–10.2)
Chloride: 97 mmol/L (ref 96–106)
Creatinine, Ser: 1.46 mg/dL — ABNORMAL HIGH (ref 0.57–1.00)
GFR calc Af Amer: 46 mL/min/{1.73_m2} — ABNORMAL LOW (ref >59)
GFR calc non Af Amer: 40 mL/min/{1.73_m2} — ABNORMAL LOW (ref >59)
Glucose: 88 mg/dL (ref 65–99)
Potassium: 4.3 mmol/L (ref 3.5–5.2)
Sodium: 143 mmol/L (ref 134–144)

## 2016-06-26 LAB — PRO B NATRIURETIC PEPTIDE: NT-Pro BNP: 57 pg/mL (ref 0–287)

## 2016-06-26 MED ORDER — TORSEMIDE 20 MG PO TABS: 60.0000 mg | ORAL_TABLET | Freq: Two times a day (BID) | ORAL | 3 refills | Status: DC

## 2016-06-26 NOTE — Patient Instructions (Signed)
Medication Instructions:  Your physician has recommended you make the following change in your medication: 1.) STOP LASIX 2.) START TORSEMIDE 60mg  twice a day.    Labwork: Your physician recommends that you return for lab work today for PRO BNP, BMET  Testing/Procedures: None Ordered   Follow-Up: Your physician recommends that you schedule a follow-up appointment in: 1 week with an APP  Any Other Special Instructions Will Be Listed Below (If Applicable).  Please make sure you get your repeat labs done on the same day as the appointment next week with an APP. The labs are PRO BNP, BMET   If you need a refill on your cardiac medications before your next appointment, please call your pharmacy.  Thank you for choosing [facility]

## 2016-06-26 NOTE — Progress Notes (Signed)
06/26/2016 Tiffany [name]   [birth year]  [id number]  Primary Physician Archie Patten, MD Primary Cardiologist: Dr. Marlou Porch    Reason for Visit/CC: Weight Gain and Dyspnea  HPI:  Tiffany [name] is a 56 y.o. obese female, followed by Dr. Marlou Porch, with a history of asthma, COPD on chronic supplemental O2, HFpEF, prior MI, HTN, hyperlipidemia, type 2 diabetes, history of cocaine abuse, anemia and anxiety. Her last echocardiogram 04/04/16 showed an ejection fraction of 60-65% with grade 2 diastolic dysfunction. Normal valve function.   She presents to clinic today with complaints of progressive weight gain. She has chronic dyspnea from her COPD and notes that her breathing has not worsened any. Her last office weight when seen by [name] (in place of Dr. Marlou Porch) in February 2018 was 279 pounds. Her office weight today is 302 lb.   [name] on Lasix 80 mg daily and reports full compliance but reports that UOP has slowed. She states her PCP has tried increasing regimen to TID but no improvement. She has also tried Metolazone as an adjunct but also notes that this did not help. She admits that she eats a diet high in sodium. She does not like cooking at home. She eats most of her meals out. She eats a lot of fast food>>[employer]aco [employer], McDonalds and at [employer].    Current Meds  Medication Sig  . albuterol (PROVENTIL) (2.5 MG/3ML) 0.083% nebulizer solution Take 3 mLs (2.5 mg total) by nebulization every 6 (six) hours as needed for wheezing or shortness of breath.  . Alcohol Swabs (B-D SINGLE USE SWABS REGULAR) PADS CHECK CAPILLARY BLOOD GLUCOSE ONE TIME DAILY  . amLODipine (NORVASC) 5 MG tablet Take 1 tablet (5 mg total) by mouth daily.  [name] aspirin 81 MG EC tablet Take 1 tablet (81 mg total) by mouth daily. Swallow whole.  [name] atorvastatin (LIPITOR) 40 MG tablet Take 1 tablet (40 mg total) by mouth daily.  . benztropine (COGENTIN) 0.5 MG tablet Take 1 tablet (0.5 mg total) by mouth 2 (two)  times daily.  . Blood Glucose Monitoring Suppl (ACCU-CHEK AVIVA PLUS) w/Device KIT CHECK BLOOD SUGAR THREE TIMES DAILY  . divalproex ([name] ER) 500 MG 24 hr tablet Take 2 tablets (1,000 mg total) by mouth at bedtime.  [name] escitalopram (LEXAPRO) 20 MG tablet Take 1 tablet (20 mg total) by mouth daily.  . fluticasone furoate-vilanterol (BREO ELLIPTA) 100-25 MCG/INH AEPB INHALE 1 PUFF EVERY DAY  . gabapentin (NEURONTIN) 600 MG tablet Take 1 tablet (600 mg total) by mouth 3 (three) times daily.  [name] glucose blood test strip Use to check blood sugar 3 times daily.  . metFORMIN (GLUCOPHAGE-XR) 500 MG 24 hr tablet Take 500 mg by mouth 2 (two) times daily.  . mirtazapine (REMERON) 30 MG tablet Take 1 tablet (30 mg total) by mouth at bedtime.  . montelukast (SINGULAIR) 10 MG tablet Take 1 tablet (10 mg total) by mouth at bedtime.  . Multiple Vitamin (MULTIVITAMIN WITH MINERALS) TABS tablet Take 1 tablet by mouth daily.  [name] oxybutynin (DITROPAN-XL) 5 MG 24 hr tablet Take 5 mg by mouth at bedtime.  . OXYGEN Inhale 6 L into the lungs daily.  . potassium chloride (KLOR-CON) 20 MEQ packet Take 40 mEq by mouth 2 (two) times daily. Take two tablets by mouth twice daily  . tiotropium (SPIRIVA HANDIHALER) 18 MCG inhalation capsule Place 1 capsule (18 mcg total) into inhaler and inhale daily.  . VENTOLIN HFA 108 (90 Base) MCG/ACT  inhaler Inhale 2 puffs into the lungs every 6 (six) hours as needed. As needed for wheezing or sob  . ziprasidone (GEODON) 80 MG capsule Take 1 capsule (80 mg total) by mouth at bedtime.  . [DISCONTINUED] furosemide (LASIX) 40 MG tablet Take 2 tablets (80 mg total) by mouth 2 (two) times daily.   Allergies  Allergen Reactions  . Hydroxyzine Shortness Of Breath    -throat closed up  . Latuda [Lurasidone Hcl] Anaphylaxis  . Codeine Nausea And Vomiting  . Sulfa Antibiotics Itching   Past Medical History:  Diagnosis Date  . Anxiety   . Arthritis    "all over" (04/10/2016)  . Asthma     . CHF (congestive heart failure) ([place])   . Chronic bronchitis ([place])   . Chronic kidney disease    "I see a kidney dr." (04/10/2016)  . Cocaine abuse   . Complication of anesthesia    decreased bp, decreased heart rate  . Depression   . Disorder of nervous system   . Emphysema   . GERD (gastroesophageal reflux disease)   . Heart attack ([place]) 1980s  . History of blood transfusion 1994   "couldn't stop bleeding from my period"  . Hyperlipidemia LDL goal <70   . Hypertension   . Incontinence   . Manic depression ([place])   . On home oxygen therapy    "6L; 24/7" (04/10/2016)  . OSA on CPAP    "wear mask sometimes" (04/10/2016)  . Paranoid ([place])    "sometimes; I'm on RX for it" (04/10/2016)  . Pneumonia    "I've had it several times; haven't had it since 06/2015" (04/10/2016)  . Schizophrenia ([place])   . Seasonal allergies   . Seizures ([place])    "don't know what kind; last one was ~ 1 yr ago" (04/10/2016)  . Sinus trouble   . Stroke [facility]) 1980s   denies residual on 04/10/2016  . Type II diabetes mellitus ([facility])    Family History  Problem Relation Age of Onset  . Cancer Father        prostate  . Cancer Mother        lung  . Depression Mother   . Depression Sister   . Anxiety disorder Sister   . Schizophrenia Sister   . Bipolar disorder Sister   . Depression Sister   . Depression Brother   . Heart failure Unknown        cousin   Past Surgical History:  Procedure Laterality Date  . CESAREAN SECTION  1997  . HERNIA REPAIR    . IR PERC CHOLECYSTOSTOMY  05/10/2016  . UMBILICAL HERNIA REPAIR  ~ 1963   "that's why I don't have a belly button"  . VAGINAL HYSTERECTOMY     Social History   Social History  . Marital status: Widowed    Spouse name: N/A  . Number of children: 3  . Years of education: N/A   Occupational History  . disabled     factory production   Social History Main Topics  . Smoking status: Former Smoker    Packs/day: 1.50    Years: 38.00    Types:  Cigarettes    Start date: 03/13/1977  . Smokeless tobacco: Never Used  . Alcohol use No  . Drug use: Yes    Types: Cocaine     Comment: 04/10/2016 "last used cocaine back in November 2017"  . Sexual activity: No   Other Topics Concern  . Not on file  Social History Narrative   Has 1 son, [name]   Lives with son and his boyfriend   Her house has ramps and handrails should she ever needs them.    Her mother lives down the street from her and is a good support person in addition to her son.   She drives herself, has private transportation.    Cocaine free since 02/24/16, smoke free since 04/10/16        Review of Systems: General: negative for chills, fever, night sweats or weight changes.  Cardiovascular: negative for chest pain, dyspnea on exertion, edema, orthopnea, palpitations, paroxysmal nocturnal dyspnea or shortness of breath Dermatological: negative for rash Respiratory: negative for cough or wheezing Urologic: negative for hematuria Abdominal: negative for nausea, vomiting, diarrhea, bright red blood per rectum, melena, or hematemesis Neurologic: negative for visual changes, syncope, or dizziness All other systems reviewed and are otherwise negative except as noted above.   Physical Exam:  Blood pressure 106/64, pulse 86, height '5\' 2"'  (1.575 m), weight (!) 302 lb (137 kg), SpO2 (!) 87 %.  General appearance: alert, cooperative and no distress Neck: no carotid bruit and no JVD Lungs: clear to auscultation bilaterally Heart: regular rate and rhythm, S1, S2 normal, no murmur, click, rub or gallop Extremities: extremities normal, atraumatic, no cyanosis or edema Pulses: 2+ and symmetric Skin: Skin color, texture, turgor normal. No rashes or lesions Neurologic: Grossly normal  EKG not performed  -- personally reviewed   ASSESSMENT AND PLAN:   1. Diastolic HF: last echo 06/2834 showed normal LVEF at 60-65% with Grade 2DD and normal valve function. Pt notes progressive weigh  gain. Last OV weight 02/2016 was 279 lb. Weight today is 302 lb. We will check a BNP today. Will also check a BMP, for baseline assessment of renal function and K. She states that she has tried TID dosing of Lasix w/o improvement as well as metoalzone w/o improvement. She notes increased abdominal dyspnea. We will switch to Torsemide for better GI absorption.  We will try 60 mg BID x 1 week. Continue supplemental K-dur. Repeat BNP and BMP in 1 week with APP f/u. Pt instructed to call our office in 2 days if no improvement, as she may need further dosing adjustment to Torsemide 80 mg BID. We discussed her diet and pt encouaraged to refrain from eating out as these foods are very high in sodium.   2.COPD/Asthma: on chronic home O2. No change in baseline status.   3. HTN: controlled on current regimen.   4. HLD: on statin therapy with Lipitor. Followed by PCP.   5. DM: followed by PCP.   6. Obesity: Body mass index is 55.24 kg/m.  Follow-Up: f/u with APP in 1 week to assess response to diuretic adjustment. Keep plans to f/u with Dr. Marlou Porch in August, as previously advised by [name].   Brittainy Ladoris Gene, MHS [facility] 06/26/2016 11:12 AM

## 2016-06-27 ENCOUNTER — Encounter: Payer: Self-pay | Admitting: Family Medicine

## 2016-06-27 ENCOUNTER — Ambulatory Visit (INDEPENDENT_AMBULATORY_CARE_PROVIDER_SITE_OTHER): Payer: Medicare HMO | Admitting: Family Medicine

## 2016-06-27 ENCOUNTER — Ambulatory Visit: Payer: Medicare HMO | Admitting: Family Medicine

## 2016-06-27 ENCOUNTER — Other Ambulatory Visit: Payer: Self-pay | Admitting: Pharmacist

## 2016-06-27 DIAGNOSIS — E118 Type 2 diabetes mellitus with unspecified complications: Secondary | ICD-10-CM

## 2016-06-27 NOTE — Patient Outreach (Addendum)
[facility]) Care Management  06/27/2016  MIN TUNNELL [birth year] [id number]   Called patient to follow up.  No answer. HIPAA compliant message left on patient's voicemail.  [place] to follow up on pill packing:  Most of the patient's medications are too soon but can be packaged at the end of the month.  Currently, they have the following medications that can be packaged at the end of the month:  Amlodipine Atorvastatin Montelukast Aspirin Furosemide Benztropine   Breo-available at the end of the month Spiriva--not avail until 08/10/16 Albuterol solution  The following medications are missing: escitalopram Divalproex Ziprasidone Mirtazapine Gabapentin Potassium Oxybutynin Metformin  The pharmacy technician at [facility] will call [facility] on [address] to transfer the patient's profile.  Plan:  Patient has a home visit with [place], [name] on 06/28/16--I will follow up with patient during her home visit.   Elayne Guerin, PharmD, [place] Clinical Pharmacist [phone]

## 2016-06-27 NOTE — Patient Instructions (Addendum)
-   TASTE PREFERENCES ARE LEARNED.  This means it will get easier to choose foods you know are good for you if you are exposed to them enough.    - IT IS VERY IMPORTANT TO EAT AT REGULAR INTERVALS DURING THE DAY BECAUSE THIS DECREASES THE RISK OF BINGEING OR OVER-EATING.  GOING FOR MORE THAN 5 HOURS WITHOUT EATING IS A SET-UP FOR POOR FOOD CHOICES.    Diet Recommendations for Diabetes  CARBOHYDRATE foods raise blood sugar the most.  CARBOHYDATE includes sugar and starch.   Starchy (carb) foods: Bread, rice, pasta, potatoes, corn, cereal, grits, crackers, bagels, muffins, all baked goods.  (Fruits, milk, and yogurt also have carbohydrate, but most of these foods will not spike your blood sugar as most starchy foods will.)  A few fruits do cause high blood sugars; use small portions of bananas (limit to 1/2 at a time), grapes, watermelon, oranges, and most tropical fruits.    Protein foods: Meat, fish, poultry, eggs, dairy foods, and beans such as pinto and kidney beans (beans also provide carbohydrate).   1. Eat at least 3 meals and 1-2 snacks per day. Never go more than 4-5 hours while awake without eating. Eat breakfast within the first hour of getting up.   2. Limit starchy foods to TWO per meal and ONE per snack. ONE portion of a starchy  food is equal to the following:   - ONE slice of bread (or its equivalent, such as half of a hamburger bun).   - 1/2 cup of a "scoopable" starchy food such as potatoes or rice.   - 15 grams of Total Carbohydrate as shown on food label.  3. Include at every meal: a protein food, a carb food, and vegetables and/or fruit.   - Obtain twice the volume of veg's as protein or carbohydrate foods for both lunch and dinner.   - Fresh or frozen veg's are best.   - Keep frozen veg's on hand for a quick vegetable serving.      Quick meal options: - Sandwich made with deli meat or tuna salad or egg salad or peanut butter along with some kind of vegetable such as sliced  tomatoes or raw carrots or peeled cucumbers AND a piece of fruit.   - 2 eggs (prepared with olive oil or boiled), apple sauce (no more than 1 cup), boiled potatoes (no more than 1 cup).   - Try sauteeing some onion and bell pepper before you scramble the eggs in the pan.  Then add frozen spinach right before you finish cooking the eggs.    I hope you enjoy the classes at the nutrition and cooking program, [employer].  When I see you next, I will ask if you have tried any new foods!  If you have questions, please call Dr. Jenne Campus at [phone].

## 2016-06-27 NOTE — Progress Notes (Signed)
Medical Nutrition Therapy:  Appt start time: 1430 end time:  4656. PCP: Kathrine Cords, MD Therapist: Francee Piccolo, [place]   Assessment:  Primary concerns today: Weight management and Blood sugar control.  Ms. Cieslinski is interested in learning how to make better food choices.  Although diagnosed with DM more than 30 years, she has never seen a dietitian or been to classes.    FBG have been running 90-130, as reflected in her A1C of Mar 2018 - 5.8.    Learning Readiness: Ready; only dietary changes she has made include choosing "honey wheat bread."  Usual eating pattern includes 1-3 meals and 1-12 snacks per day; pattern tends to be erratic.  Snacks are variable, sometimes will eat a whole package of food.  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  Just stopped stopped drug abuse in November 2017, and stopped smoking in March 2018.  Food has tended to fill the gaps as she has struggled with these changes.    I provided Ms. Bartelson with a food box today, and she signed up for the [employer] supplemental food program and [employer] nutrition ed program.    Frequent foods and beverages include soda, ice crm, pretzels, Subway, McDonald's, and Cookout foods.  Avoided foods include vegetables (disliked) and most fruit (too expensive).   Usual physical activity includes none currently; limited by breathing difficulties.    Progress Towards Goal(s):  In progress.   Nutritional Diagnosis:  NB-1.1 Food and nutrition-related knowledge deficit As related to weight management and BG control.  As evidenced by patient acknowledgement.    Intervention:  Nutrition education.  Handouts given during visit include:  AVS  Food box and application for [employer] supplemental food program and [employer] nutrition ed program   Demonstrated degree of understanding via:  Teach Back  Barriers to learning/adherence to lifestyle change: Knowledge deficit as  well as disinterest in food preparation.   Monitoring/Evaluation:  Dietary intake, exercise, FBG, and body weight in 6 week(s).

## 2016-06-28 ENCOUNTER — Other Ambulatory Visit: Payer: Self-pay | Admitting: Pharmacist

## 2016-06-28 ENCOUNTER — Encounter: Payer: Self-pay | Admitting: Family Medicine

## 2016-06-28 ENCOUNTER — Other Ambulatory Visit: Payer: Self-pay | Admitting: *Deleted

## 2016-06-28 ENCOUNTER — Telehealth: Payer: Self-pay | Admitting: Cardiology

## 2016-06-28 ENCOUNTER — Ambulatory Visit (INDEPENDENT_AMBULATORY_CARE_PROVIDER_SITE_OTHER): Payer: Medicare HMO | Admitting: Family Medicine

## 2016-06-28 DIAGNOSIS — I5033 Acute on chronic diastolic (congestive) heart failure: Secondary | ICD-10-CM | POA: Diagnosis not present

## 2016-06-28 DIAGNOSIS — J449 Chronic obstructive pulmonary disease, unspecified: Secondary | ICD-10-CM

## 2016-06-28 DIAGNOSIS — I5030 Unspecified diastolic (congestive) heart failure: Secondary | ICD-10-CM

## 2016-06-28 DIAGNOSIS — M1990 Unspecified osteoarthritis, unspecified site: Secondary | ICD-10-CM

## 2016-06-28 MED ORDER — NAPROXEN 500 MG PO TBEC: 500.0000 mg | DELAYED_RELEASE_TABLET | Freq: Two times a day (BID) | ORAL | 0 refills | Status: DC | PRN

## 2016-06-28 MED ORDER — GABAPENTIN 600 MG PO TABS: 600.0000 mg | ORAL_TABLET | Freq: Three times a day (TID) | ORAL | 0 refills | Status: DC

## 2016-06-28 MED ORDER — OMEPRAZOLE 40 MG PO CPDR: 40.0000 mg | DELAYED_RELEASE_CAPSULE | Freq: Two times a day (BID) | ORAL | 0 refills | Status: DC

## 2016-06-28 MED ORDER — METFORMIN HCL ER 500 MG PO TB24: 500.0000 mg | ORAL_TABLET | Freq: Two times a day (BID) | ORAL | 0 refills | Status: DC

## 2016-06-28 MED ORDER — TORSEMIDE 20 MG PO TABS: 60.0000 mg | ORAL_TABLET | Freq: Two times a day (BID) | ORAL | 0 refills | Status: DC

## 2016-06-28 MED ORDER — POTASSIUM CHLORIDE CRYS ER 20 MEQ PO TBCR: 40.0000 meq | EXTENDED_RELEASE_TABLET | Freq: Two times a day (BID) | ORAL | 0 refills | Status: DC

## 2016-06-28 NOTE — Progress Notes (Signed)
Subjective: CC: f/u breathing HPI: Patient is a 56 y.o. female with a past medical history of CKD 2, diastolic heart failure, COPD, type 2 diabetes, bipolar disorder presenting to clinic today for a f/u on breathing.  Breathing: She met with cardiology on 6/11. At that time she was instructed to discontinue Lasix and start torsemide 60 mg twice a day. She has continued potassium 40 mEq twice daily. She feels breathing is better after seeing cardiology.  Feels she lost weight in her mid-section which has made it easier to breath She notes at one point over the last week she was seeing 313lbs.  O2 ranging around: 87-98% Still on 6 L nasal cannula. Can't lay flat due to SOB, this is stable.  Feels she has stable lower extremity edema. Denies any wheezing. No chest pain. No cough, fevers, chills. No upper respiratory symptoms. Weighing herself in the AM: today 302, yesterday 303, can't recall other weights.   [name] now taking Naproxen twice daily which makes her very happy. She notes she has diffuse joint pain, non-predominates. [name] unable to function with only taking naproxen once daily and Tylenol scheduled. She didn't feel the tramadol helped. Given her history of substance abuse, she is not a great candidate for opioids.  Social History: still smoke free!   ROS: All other systems reviewed and are negative.  Past Medical History Patient Active Problem List   Diagnosis Date Noted  . Overactive bladder 06/07/2016  . Gallstones   . Upper abdominal pain   . Cholecystitis 05/09/2016  . QT prolongation   . OSA and COPD overlap syndrome ([place])   . Generalized weakness   . Arthritis   . Bipolar I disorder ([place])   . Hypokalemia 04/21/2016  . Wheezing 04/12/2016  . (HFpEF) heart failure with preserved ejection fraction ([place])   . Benign essential HTN 03/22/2016  . Rectal bleeding 12/31/2015  . Bipolar affective disorder, mixed, severe, with psychotic behavior ([place]) 11/28/2015   . Cocaine use disorder, severe, dependence ([place]) 11/28/2015  . Hyponatremia 11/25/2015  . AKI (acute kidney injury) ([place]) 11/24/2015  . Blood in sputum   . Chronic diastolic heart failure ([place])   . SOB (shortness of breath)   . Acute on chronic respiratory failure with hypoxia and hypercapnia ([place]) 06/22/2015  . Onychomycosis 01/21/2015  . Nasal congestion 12/17/2014  . Tobacco use disorder 07/22/2014  . COPD (chronic obstructive pulmonary disease) ([place]) 07/08/2014  . Seborrheic keratoses 12/31/2013  . Knee pain, bilateral 01/22/2013  . Seizure ([place]) 01/04/2013  . Health care maintenance 11/25/2012  . Folliculitis 28/97/9150  . History of kidney stones 06/18/2012  . Chronic pain syndrome 06/18/2012  . Muscle spasm 06/18/2012  . Elevated troponin 04/28/2012  . Allergic reaction 04/07/2012  . Itching 09/06/2011  . HSV infection 08/30/2011  . Dyslipidemia 04/24/2011  . Anemia 04/24/2011  . Leukocytosis 04/24/2011  . Diabetic neuropathy ([place]) 04/24/2011  . Obstructive sleep apnea 10/18/2010  . Asthma 10/18/2010  . Morbid obesity ([place]) 10/18/2010  . Type 2 diabetes mellitus ([place]) 10/18/2010    Medications- reviewed and updated Current Outpatient Prescriptions  Medication Sig Dispense Refill  . albuterol (PROVENTIL) (2.5 MG/3ML) 0.083% nebulizer solution Take 3 mLs (2.5 mg total) by nebulization every 6 (six) hours as needed for wheezing or shortness of breath. 150 mL 1  . Alcohol Swabs (B-D SINGLE USE SWABS REGULAR) PADS CHECK CAPILLARY BLOOD GLUCOSE ONE TIME DAILY 100 each 3  . amLODipine (NORVASC) 5 MG tablet Take 1  tablet (5 mg total) by mouth daily. 90 tablet 0  . aspirin 81 MG EC tablet Take 1 tablet (81 mg total) by mouth daily. Swallow whole. 90 tablet 0  . atorvastatin (LIPITOR) 40 MG tablet Take 1 tablet (40 mg total) by mouth daily. 90 tablet 0  . benztropine (COGENTIN) 0.5 MG tablet Take 1 tablet (0.5 mg total) by mouth 2 (two) times daily. 60 tablet 0  . Blood  Glucose Monitoring Suppl (ACCU-CHEK AVIVA PLUS) w/Device KIT CHECK BLOOD SUGAR THREE TIMES DAILY 1 kit 0  . divalproex ([name] ER) 500 MG 24 hr tablet Take 2 tablets (1,000 mg total) by mouth at bedtime. 60 tablet 1  . escitalopram (LEXAPRO) 20 MG tablet Take 1 tablet (20 mg total) by mouth daily. 30 tablet 1  . fluticasone furoate-vilanterol (BREO ELLIPTA) 100-25 MCG/INH AEPB INHALE 1 PUFF EVERY DAY 180 each 0  . gabapentin (NEURONTIN) 600 MG tablet Take 1 tablet (600 mg total) by mouth 3 (three) times daily. 270 tablet 0  . glucose blood test strip Use to check blood sugar 3 times daily. 100 each 12  . metFORMIN (GLUCOPHAGE-XR) 500 MG 24 hr tablet Take 1 tablet (500 mg total) by mouth 2 (two) times daily. 180 tablet 0  . mirtazapine (REMERON) 30 MG tablet Take 1 tablet (30 mg total) by mouth at bedtime. 30 tablet 1  . montelukast (SINGULAIR) 10 MG tablet Take 1 tablet (10 mg total) by mouth at bedtime. 90 tablet 1  . Multiple Vitamin (MULTIVITAMIN WITH MINERALS) TABS tablet Take 1 tablet by mouth daily.    . naproxen (NAPROXEN DR) 500 MG EC tablet Take 1 tablet (500 mg total) by mouth 2 (two) times daily as needed. 60 tablet 0  . omeprazole (PRILOSEC) 40 MG capsule Take 1 capsule (40 mg total) by mouth 2 (two) times daily. 180 capsule 0  . oxybutynin (DITROPAN-XL) 5 MG 24 hr tablet Take 5 mg by mouth at bedtime.    . OXYGEN Inhale 6 L into the lungs daily.    . potassium chloride SA (K-DUR,KLOR-CON) 20 MEQ tablet Take 2 tablets (40 mEq total) by mouth 2 (two) times daily. 120 tablet 0  . tiotropium (SPIRIVA HANDIHALER) 18 MCG inhalation capsule Place 1 capsule (18 mcg total) into inhaler and inhale daily. 90 capsule 1  . torsemide (DEMADEX) 20 MG tablet Take 3 tablets (60 mg total) by mouth 2 (two) times daily. 180 tablet 0  . VENTOLIN HFA 108 (90 Base) MCG/ACT inhaler Inhale 2 puffs into the lungs every 6 (six) hours as needed. As needed for wheezing or sob    . ziprasidone (GEODON) 80 MG  capsule Take 1 capsule (80 mg total) by mouth at bedtime. 30 capsule 1   No current facility-administered medications for this visit.     Objective: Office vital signs reviewed. BP 122/76   Pulse 89   Temp 98.5 F (36.9 C) (Oral)   Ht _0  (1.575 m)   Wt 299 lb 12.8 oz (136 kg)   SpO2 93%   BMI 54.83 kg/m  06/20/16: 299  Physical Examination:  General: Awake, alert, well- nourished, NAD Cardio: RRR, no m/r/g noted. Trace pitting edema. Difficult to assess JVD.  Pulm: No increased WOB.  CTAB, without wheezes, rhonchi or crackles noted. On 6L [place].  MSK: normal to inspection with no tremor. Good hand grip/symmetric. Normal DRTs. No clonus. No spasticity noted.  Abdomen: Cholecystostomy tube in place with a small amount of granulation tissue around the  insertion site without erythema. Small amount of brown drainage noted on the gauze. Draining bilious fluid in the bag   Assessment/Plan:   SOB: Symptoms have improved and weight is stable. Continue torsemide 60 mg twice a day. Continue daily weights. K looked stable from 2 days ago, continue KDUR 62mQ BID for now. If she notes no improvement in weight, pt to call cardiology as instructed at their visit, may need torsemide 832mBID.  Arthritis: Diffuse with no predominance. Patient states feeling that helps is naproxen.  Her renal function does not tolerate this well.  In the past we stated it was okay to try naproxen when her renal function had normalized, but as of her cardiology appointment 2 days ago, it is worsened. The patient is aware of the risk and benefits and wants to continue to use the naproxen for now. No benefit from tramadol in the past. Given her history of cocaine abuse in the past, I would prefer to avoid opioids. -Patient has a consult in for palliative care, hopefully they can assist in this issue.  The patient has a follow-up with cardiology on Tuesday of next week, she does not feel comfortable going more than 1 week  without seeing me so she will follow-up at the end of next week.  No orders of the defined types were placed in this encounter.    CrArchie PattenGY-3, [place]

## 2016-06-28 NOTE — Patient Outreach (Addendum)
[facility]) Care Management  06/28/2016  DONYAE KOHN [birth year] [id number]  Patient was called and I ended up visiting her impromptu during her home visit with [place], [name].  HIPAA identifiers were obtained.  Patient's medications were reviewed and assessed to see which medications she is running out of and will need to get filled at [facility].  The following medications need a new prescription to be sent by Dr. Lorenso Courier:  Gabapentin Klor-Con M20 Naproxen Metformin XR Omeprazole Torsemide  The following medications need a new prescription to be sent in by Dr. [name]:  Mirtazapine Ziprasidone Divalproex Escitalopram   The following medications are already at the pharmacy: Benztropine Montelukast Atorvastain Amlodipine Breo Ellipta Spiriva Furosemide (since patient has been switched to torsemide-the pharmacy was asked to void the furosemide prescription for now)  Patient was given a written list of medications that need prescriptions escribed for her to take to her 2:00pm appointment with Dr. Lorenso Courier.  Patient has an appointment with Dr. [name] tomorrow.  Patient was asked to request refills to be sent to [facility] by Dr. [name].  He will also be sent an inbasket message.  [place] said the patient's pill packs should be ready for delivery on 07/02/16.  Plan:  1.  Follow up with the patient in 3 business days.   Elayne Guerin, PharmD, [place] Clinical Pharmacist [phone]

## 2016-06-28 NOTE — Patient Instructions (Signed)
Continue with the torsemide 60 mg twice daily Continue with the potassium, 40 mEq twice daily  If you note your weight is increasing instead of improving, please contact the cardiologist You have a palliative care consult, they are wonderful at helping symptom management. When you meet with the palliative care provider, asked them about alternative medications for your diffuse joint pain.

## 2016-06-28 NOTE — Assessment & Plan Note (Signed)
Follow up with pulmonology

## 2016-06-28 NOTE — Telephone Encounter (Signed)
Follow up    Pt is calling back for lab results.

## 2016-06-28 NOTE — Patient Outreach (Signed)
[facility]) Care Management   06/28/2016  Tiffany [name] Feb 10, 1960 [id number]  Tiffany [name] is an 56 y.o. female  Subjective:   Member alert and oriented x3, denies pain or discomfort at this time.  Objective:   Review of Systems  Constitutional: Negative.   HENT: Negative.   Eyes: Negative.   Respiratory: Positive for shortness of breath.        Intermittent  Cardiovascular: Positive for leg swelling.  Gastrointestinal: Negative.   Genitourinary: Negative.   Musculoskeletal: Negative.   Skin: Negative.   Neurological: Negative.   Endo/Heme/Allergies: Negative.   Psychiatric/Behavioral: Negative.     Physical Exam  Constitutional: [name] oriented to person, place, and time. She appears well-developed and well-nourished.  Neck: Normal range of motion.  Cardiovascular: Normal rate, regular rhythm and normal heart sounds.   Respiratory: Effort normal and breath sounds normal.  GI: Soft. Bowel sounds are normal.  Musculoskeletal: Normal range of motion.  Neurological: [name] alert and oriented to person, place, and time.  Skin: Skin is warm and dry.   BP 115/88 (BP Location: Left Arm, Patient Position: Sitting, Cuff Size: Normal)   Pulse 88   Resp 20   Wt (!) 303 lb (137.4 kg)   SpO2 96%   BMI 55.42 kg/m   Encounter Medications:   Outpatient Encounter Prescriptions as of 06/28/2016  Medication Sig  . albuterol (PROVENTIL) (2.5 MG/3ML) 0.083% nebulizer solution Take 3 mLs (2.5 mg total) by nebulization every 6 (six) hours as needed for wheezing or shortness of breath.  . Alcohol Swabs (B-D SINGLE USE SWABS REGULAR) PADS CHECK CAPILLARY BLOOD GLUCOSE ONE TIME DAILY  . amLODipine (NORVASC) 5 MG tablet Take 1 tablet (5 mg total) by mouth daily.  [name] aspirin 81 MG EC tablet Take 1 tablet (81 mg total) by mouth daily. Swallow whole.  [name] atorvastatin (LIPITOR) 40 MG tablet Take 1 tablet (40 mg total) by mouth daily.  . benztropine (COGENTIN) 0.5 MG  tablet Take 1 tablet (0.5 mg total) by mouth 2 (two) times daily.  . Blood Glucose Monitoring Suppl (ACCU-CHEK AVIVA PLUS) w/Device KIT CHECK BLOOD SUGAR THREE TIMES DAILY  . divalproex ([name] ER) 500 MG 24 hr tablet Take 2 tablets (1,000 mg total) by mouth at bedtime.  [name] escitalopram (LEXAPRO) 20 MG tablet Take 1 tablet (20 mg total) by mouth daily.  . fluticasone furoate-vilanterol (BREO ELLIPTA) 100-25 MCG/INH AEPB INHALE 1 PUFF EVERY DAY  . gabapentin (NEURONTIN) 600 MG tablet Take 1 tablet (600 mg total) by mouth 3 (three) times daily.  [name] glucose blood test strip Use to check blood sugar 3 times daily.  . metFORMIN (GLUCOPHAGE-XR) 500 MG 24 hr tablet Take 500 mg by mouth 2 (two) times daily.  . mirtazapine (REMERON) 30 MG tablet Take 1 tablet (30 mg total) by mouth at bedtime.  . montelukast (SINGULAIR) 10 MG tablet Take 1 tablet (10 mg total) by mouth at bedtime.  . Multiple Vitamin (MULTIVITAMIN WITH MINERALS) TABS tablet Take 1 tablet by mouth daily.  [name] NAPROXEN DR PO Take by mouth 2 (two) times daily.  [name] omeprazole (PRILOSEC) 40 MG capsule Take 40 mg by mouth 2 (two) times daily.  [name] oxybutynin (DITROPAN-XL) 5 MG 24 hr tablet Take 5 mg by mouth at bedtime.  . OXYGEN Inhale 6 L into the lungs daily.  . potassium chloride (KLOR-CON) 20 MEQ packet Take 40 mEq by mouth 2 (two) times daily. Take two tablets by mouth twice daily  .  tiotropium (SPIRIVA HANDIHALER) 18 MCG inhalation capsule Place 1 capsule (18 mcg total) into inhaler and inhale daily.  [name] torsemide (DEMADEX) 20 MG tablet Take 3 tablets (60 mg total) by mouth 2 (two) times daily.  . VENTOLIN HFA 108 (90 Base) MCG/ACT inhaler Inhale 2 puffs into the lungs every 6 (six) hours as needed. As needed for wheezing or sob  . ziprasidone (GEODON) 80 MG capsule Take 1 capsule (80 mg total) by mouth at bedtime.   No facility-administered encounter medications on file as of 06/28/2016.     Functional Status:   In your present state of  health, do you have any difficulty performing the following activities: 05/09/2016 04/25/2016  Hearing? N N  Vision? N N  Difficulty concentrating or making decisions? N N  Walking or climbing stairs? Y Y  Dressing or bathing? N Y  Doing errands, shopping? Y N  Preparing Food and eating ? - N  Using the Toilet? - N  In the past six months, have you accidently leaked urine? - N  Do you have problems with loss of bowel control? - N  Managing your Medications? - Y  Managing your Finances? - Y  Housekeeping or managing your Housekeeping? - Y  Some recent data might be hidden    Fall/Depression Screening:    Fall Risk  06/20/2016 04/25/2016 04/17/2016  Falls in the past year? No No No  Number falls in past yr: - - -  Injury with Fall? - - -  Risk Factor Category  - - -  Risk for fall due to : - History of fall(s);Impaired mobility Impaired mobility  Follow up - - -   PHQ 2/9 Scores 06/20/2016 06/14/2016 06/06/2016 06/02/2016 05/31/2016 05/26/2016 05/24/2016  PHQ - 2 Score 0 0 0 0 0 1 1  PHQ- 9 Score - - - - - - -  Some encounter information is confidential and restricted. Go to Review Flowsheets activity to see all data.    Assessment:    Met with member at scheduled time.  Member noted to have multiple pizza boxes as well as donuts, chips, canned foods, and other high sugar/high sodium foods around the home.  She state she did have appointment with dietician yesterday and developed a nutrition plan.  She report that she will decrease the foods she has been eating rather than stopping them completely.  Follow up appointment scheduled for next month.    She has had appointment with cardiologist earlier this week, new medications started, and will have appointment with primary MD today and psychiatrist tomorrow.  She report compliance with medications, K. Luciana Axe, pharmacist, involved in case for medication management.  Member denies any urgent concerns at this time.  Has follow up with radiology to  evaluate percutaneous drain next week.  Advised to contact with quesitons.  Plan:   Will follow up next month with routine home visit.  [facility] CM Care Plan Problem One     Most Recent Value  Care Plan Problem One  Risk for hospital readmission related to recent admission for heart failure vs. possible end-stage COPD  Role Documenting the Problem One  Care Management [place] for Problem One  Not Active  [facility] Long Term Goal   Patient will remain free from hospital readmision within the next 31 days  [facility] Long Term Goal Start Date  05/15/16 [Date reset due to [place] Term Goal Met Date  06/28/16  Interventions for Problem One Long  Term Goal  RNCM educated patient on importance of following dc instructions, weighing daily and reporting weight gain, taking medications as prescribed, attending all MD follow up appointments  [facility] CM Short Term Goal #1   Patient will attend all follow up appointments as sceduled within the next 30 days  [facility] CM Short Term Goal #1 Start Date  05/15/16 [facility] follow up appointment related to recent discharge]  [facility] CM Short Term Goal #1 Met Date  06/01/16  Interventions for Short Term Goal #1  RNCM ensured patient has scheduled follow up with ,  educated on importance of following up with cardiologist and pulmonologist as well for chronic conditions  [facility] CM Short Term Goal #2   Patient will take all medications as prescribed for the next 30 days  [facility] CM Short Term Goal #2 Start Date  05/15/16 [Date reset due to readmission]  [facility] CM Short Term Goal #2 Met Date  06/19/16  Interventions for Short Term Goal #2  RNCM ensured patient has all prescribed medications and educated on importance of taking them as prescribed,  pharmacy referral for patient related to concerns/reported confusion about her medications    [facility] CM Care Plan Problem Two     Most Recent Value  Care Plan Problem Two  Risk for infection related to cholecystitis as evidenced by  prescence of percutaneous drain  Role Documenting the Problem Two  Care Management Coordinator  Care Plan for Problem Two  Not Active  [facility] CM Short Term Goal #1   Member will not show signs of infection over the next 4 weeks  [facility] CM Short Term Goal #1 Start Date  05/15/16  [facility] CM Short Term Goal #1 Met Date   06/19/16  Interventions for Short Term Goal #2   Member educated on signs/symptoms of infection, confirmed home health has started and member has been trained on proper technique of dressing changes/flushes.    [facility] CM Care Plan Problem Three     Most Recent Value  Care Plan Problem Three  Knowledge deficit regarding heart failure management as evidenced by multiple hospital admissions  Role Documenting the Problem Three  Care Management [place] for Problem Three  Active  [facility] Long Term Goal   Member will report understanding of yellow zone of heart failure action plan and when to contact MD within the next 31 days  [facility] Long Term Goal Start Date  06/28/16  Interventions for Problem Three Long Term Goal  Re-educated member on heart failure zones, verified that member has [facility] calendar book that includes heart faiure zones  [facility] CM Short Term Goal #1   Member will weigh and record readings daily over the next 4 weeks  [facility] CM Short Term Goal #1 Start Date  06/28/16  Interventions for Short Term Goal #1  Re-educated on importance of daily weights in effort to effectively manage heart failure  [facility] CM Short Term Goal #2   Member will report decrease in high sodium foods over the next 4 weeks (decrease fast food from 7 days a week to 3 days a week)  [facility] CM Short Term Goal #2 Start Date  06/28/16  Interventions for Short Term Goal #2  Re-educated member on foods high in sodium and foods that are more appropriate to eat according to heart failure diet.     [name], [place], MSN [place] [phone]

## 2016-06-28 NOTE — Telephone Encounter (Signed)
Reviewed results of lab work with patient. She states she feels some better since starting diuretic. I advised her to call back if symptoms worsen. She verbalized understanding and agreement.

## 2016-06-28 NOTE — Assessment & Plan Note (Signed)
Symptoms have improved and weight is stable. Continue torsemide 60 mg twice a day. Continue daily weights. K looked stable from 2 days ago, continue KDUR 61mEQ BID for now. If she notes no improvement in weight, pt to call cardiology as instructed at their visit, may need torsemide 80mg  BID.

## 2016-06-28 NOTE — Assessment & Plan Note (Signed)
Diffuse with no predominance. Patient states feeling that helps is naproxen.  Her renal function does not tolerate this well.  In the past we stated it was okay to try naproxen when her renal function had normalized, but as of her cardiology appointment 2 days ago, it is worsened. The patient is aware of the risk and benefits and wants to continue to use the naproxen for now. No benefit from tramadol in the past. Given her history of cocaine abuse in the past, I would prefer to avoid opioids. -Patient has a consult in for palliative care, hopefully they can assist in this issue.

## 2016-06-28 NOTE — Telephone Encounter (Signed)
Follow Up:    Returning your call from today,concerning her lab results.

## 2016-06-29 ENCOUNTER — Other Ambulatory Visit: Payer: Self-pay | Admitting: Family Medicine

## 2016-06-29 ENCOUNTER — Encounter (HOSPITAL_COMMUNITY): Payer: Self-pay | Admitting: Psychiatry

## 2016-06-29 ENCOUNTER — Ambulatory Visit (INDEPENDENT_AMBULATORY_CARE_PROVIDER_SITE_OTHER): Payer: Medicare HMO | Admitting: Psychiatry

## 2016-06-29 ENCOUNTER — Ambulatory Visit
Admission: RE | Admit: 2016-06-29 | Discharge: 2016-06-29 | Disposition: A | Discharge status: Home / Self Care | Payer: Medicare HMO | Source: Ambulatory Visit | Attending: Surgery | Admitting: Surgery

## 2016-06-29 ENCOUNTER — Other Ambulatory Visit (HOSPITAL_COMMUNITY): Payer: Self-pay

## 2016-06-29 VITALS — BP 124/68 | Ht 64.0 in | Wt 305.0 lb

## 2016-06-29 DIAGNOSIS — F1420 Cocaine dependence, uncomplicated: Secondary | ICD-10-CM

## 2016-06-29 DIAGNOSIS — F319 Bipolar disorder, unspecified: Secondary | ICD-10-CM | POA: Diagnosis not present

## 2016-06-29 DIAGNOSIS — K819 Cholecystitis, unspecified: Secondary | ICD-10-CM

## 2016-06-29 DIAGNOSIS — Z87891 Personal history of nicotine dependence: Secondary | ICD-10-CM

## 2016-06-29 DIAGNOSIS — F200 Paranoid schizophrenia: Secondary | ICD-10-CM

## 2016-06-29 DIAGNOSIS — Z818 Family history of other mental and behavioral disorders: Secondary | ICD-10-CM

## 2016-06-29 DIAGNOSIS — F1421 Cocaine dependence, in remission: Secondary | ICD-10-CM | POA: Diagnosis not present

## 2016-06-29 HISTORY — PX: IR RADIOLOGIST EVAL & MGMT: IMG5224

## 2016-06-29 MED ORDER — DIVALPROEX SODIUM ER 500 MG PO TB24: 1000.0000 mg | ORAL_TABLET | Freq: Every day | ORAL | 1 refills | Status: DC

## 2016-06-29 MED ORDER — ESCITALOPRAM OXALATE 20 MG PO TABS: 20.0000 mg | ORAL_TABLET | Freq: Every day | ORAL | 1 refills | Status: DC

## 2016-06-29 MED ORDER — ZIPRASIDONE HCL 80 MG PO CAPS: 80.0000 mg | ORAL_CAPSULE | Freq: Every day | ORAL | 1 refills | Status: DC

## 2016-06-29 MED ORDER — MIRTAZAPINE 45 MG PO TABS: 45.0000 mg | ORAL_TABLET | Freq: Every day | ORAL | 1 refills | Status: DC

## 2016-06-29 NOTE — Progress Notes (Signed)
BH MD/PA/NP OP Progress Note  06/29/2016 3:19 PM Tiffany [name]  MRN:  [id number]  Chief Complaint:  Chief Complaint    Follow-up     Subjective:  I am doing so-so.  I have a lot of health issues.  Some nights I do not sleep.  But my mood swings are much better.  HPI: Patient came for her follow-up appointment.  [name] taking [name] thousand milligrams it is helping her mood irritability, anger and mania and psychosis.  However she continued to endorse poor sleep and racing thoughts.  Since her cousin moved out she is more calm.  [name] trying to get along with her son who lives with her.  Patient told her son has mental illness but sometime he does not take his medication.  She has chronic health issues and she is seeing family practitioner for COPD and other health conditions.  Recently she has noticed that she has easily tired, fatigued, lack of energy and chronic pain.  [name] taking multiple medication for health issues.  She has no tremors or shakes.  She used to take higher dose of Geodon but due to increased QTC it was reduced.  She feels proud that she's been sober from drugs and also quit smoking.  [name] seeing [name] for therapy.  Her vital signs are okay.  Her energy level is low.  She saw her recently her primary care physician and they will watch closely her creatinine.  Visit Diagnosis:    ICD-10-CM   1. Bipolar 1 disorder (HCC)Chronic F31.9   2. Cocaine use disorder, severe, dependence (HCC)Chronic F14.20   3. Paranoid schizophrenia ([place]) F20.0 mirtazapine (REMERON) 45 MG tablet    divalproex ([name] ER) 500 MG 24 hr tablet    escitalopram (LEXAPRO) 20 MG tablet    ziprasidone (GEODON) 80 MG capsule    Past Psychiatric History: Reviewed. Patient has multiple psychiatric hospitalization due to relapse into drug use and mental illness. Her last psychiatric hospitalization was in November 2017 at [facility]. She remember having psychiatric illness started  in 18-Mar-1984 when her daughter died due to cancer. She had tried Abilify, Risperdal , Wellbutrin , Xanax , Klonopin, trazodone, BuSpar in the past. She was seeing psychiatrist at [facility] . Patient denies any history of suicidal attempt but reported history of suicidal thoughts. She had history of paranoia, delusion, anxiety and manic-like symptoms. In the past she was high dose of geodon but reduced due to increase QTC.  Past Medical History:  Past Medical History:  Diagnosis Date  . Anxiety   . Arthritis    "all over" (04/10/2016)  . Asthma   . CHF (congestive heart failure) ([place])   . Chronic bronchitis ([place])   . Chronic kidney disease    "I see a kidney dr." (04/10/2016)  . Cocaine abuse   . Complication of anesthesia    decreased bp, decreased heart rate  . Depression   . Disorder of nervous system   . Emphysema   . GERD (gastroesophageal reflux disease)   . Heart attack ([place]) 1980s  . History of blood transfusion 1994   "couldn't stop bleeding from my period"  . Hyperlipidemia LDL goal <70   . Hypertension   . Incontinence   . Manic depression ([place])   . On home oxygen therapy    "6L; 24/7" (04/10/2016)  . OSA on CPAP    "wear mask sometimes" (04/10/2016)  . Paranoid ([place])    "sometimes; I'm on RX  for it" (04/10/2016)  . Pneumonia    "I've had it several times; haven't had it since 06/2015" (04/10/2016)  . Schizophrenia ([place])   . Seasonal allergies   . Seizures ([place])    "don't know what kind; last one was ~ 1 yr ago" (04/10/2016)  . Sinus trouble   . Stroke [facility]) 1980s   denies residual on 04/10/2016  . Type II diabetes mellitus ([place])     Past Surgical History:  Procedure Laterality Date  . CESAREAN SECTION  1997  . HERNIA REPAIR    . IR PERC CHOLECYSTOSTOMY  05/10/2016  . UMBILICAL HERNIA REPAIR  ~ 1963   "that's why I don't have a belly button"  . VAGINAL HYSTERECTOMY      Family Psychiatric History: Reviewed.  Family History:  Family History  Problem Relation Age  of Onset  . Cancer Father        prostate  . Cancer Mother        lung  . Depression Mother   . Depression Sister   . Anxiety disorder Sister   . Schizophrenia Sister   . Bipolar disorder Sister   . Depression Sister   . Depression Brother   . Heart failure Unknown        cousin    Social History:  Social History   Social History  . Marital status: Widowed    Spouse name: N/A  . Number of children: 3  . Years of education: N/A   Occupational History  . disabled     factory production   Social History Main Topics  . Smoking status: Former Smoker    Packs/day: 1.50    Years: 38.00    Types: Cigarettes    Start date: 03/13/1977    Quit date: 04/10/2016  . Smokeless tobacco: Never Used  . Alcohol use No  . Drug use: No     Comment: 04/10/2016 "last used cocaine back in November 2017"  . Sexual activity: No   Other Topics Concern  . None   Social History Narrative   Has 1 son, [name]   Lives with son and his boyfriend   Her house has ramps and handrails should she ever needs them.    Her mother lives down the street from her and is a good support person in addition to her son.   She drives herself, has private transportation.    Cocaine free since 02/24/16, smoke free since 04/10/16       Allergies:  Allergies  Allergen Reactions  . Hydroxyzine Shortness Of Breath    -throat closed up  . Latuda [Lurasidone Hcl] Anaphylaxis  . Codeine Nausea And Vomiting  . Sulfa Antibiotics Itching    Metabolic Disorder Labs: Recent Results (from the past 2160 hour(s))  Basic metabolic panel     Status: Abnormal   Collection Time: 04/05/16 12:27 PM  Result Value Ref Range   Glucose 84 65 - 99 mg/dL   BUN 14 6 - 24 mg/dL   Creatinine, Ser 1.20 (H) 0.57 - 1.00 mg/dL   GFR calc non Af Amer 51 (L) >59 mL/min/1.73   GFR calc Af Amer 58 (L) >59 mL/min/1.73   BUN/Creatinine Ratio 12 9 - 23   Sodium 145 (H) 134 - 144 mmol/L   Potassium 3.6 3.5 - 5.2 mmol/L   Chloride 98 96 -  106 mmol/L   CO2 33 (H) 18 - 29 mmol/L   Calcium 9.9 8.7 - 10.2 mg/dL  Brain natriuretic peptide  Status: None   Collection Time: 04/05/16 12:27 PM  Result Value Ref Range   BNP 27.7 0.0 - 100.0 pg/mL  Comprehensive metabolic panel     Status: Abnormal   Collection Time: 04/10/16  4:50 PM  Result Value Ref Range   Sodium 145 135 - 145 mmol/L   Potassium 3.8 3.5 - 5.1 mmol/L   Chloride 96 (L) 101 - 111 mmol/L   CO2 38 (H) 22 - 32 mmol/L   Glucose, Bld 90 65 - 99 mg/dL   BUN 24 (H) 6 - 20 mg/dL   Creatinine, Ser 1.44 (H) 0.44 - 1.00 mg/dL   Calcium 10.0 8.9 - 10.3 mg/dL   Total Protein 6.1 (L) 6.5 - 8.1 g/dL   Albumin 3.3 (L) 3.5 - 5.0 g/dL   AST 20 15 - 41 U/L   ALT 18 14 - 54 U/L   Alkaline Phosphatase 95 38 - 126 U/L   Total Bilirubin 0.4 0.3 - 1.2 mg/dL   GFR calc non Af Amer 40 (L) >60 mL/min   GFR calc Af Amer 46 (L) >60 mL/min    Comment: (NOTE) The eGFR has been calculated using the CKD EPI equation. This calculation has not been validated in all clinical situations. eGFR's persistently <60 mL/min signify possible Chronic Kidney Disease.    Anion gap 11 5 - 15  Magnesium     Status: None   Collection Time: 04/10/16  4:50 PM  Result Value Ref Range   Magnesium 1.9 1.7 - 2.4 mg/dL  CBC     Status: Abnormal   Collection Time: 04/10/16  4:50 PM  Result Value Ref Range   WBC 14.6 (H) 4.0 - 10.5 K/uL   RBC 3.98 3.87 - 5.11 MIL/uL   Hemoglobin 11.1 (L) 12.0 - 15.0 g/dL   HCT 37.0 36.0 - 46.0 %   MCV 93.0 78.0 - 100.0 fL   MCH 27.9 26.0 - 34.0 pg   MCHC 30.0 30.0 - 36.0 g/dL   RDW 15.6 (H) 11.5 - 15.5 %   Platelets 416 (H) 150 - 400 K/uL  Brain natriuretic peptide     Status: None   Collection Time: 04/10/16  4:50 PM  Result Value Ref Range   B Natriuretic Peptide 21.3 0.0 - 100.0 pg/mL  Hemoglobin A1c     Status: Abnormal   Collection Time: 04/10/16  4:50 PM  Result Value Ref Range   Hgb A1c MFr Bld 5.8 (H) 4.8 - 5.6 %    Comment: (NOTE)          Pre-diabetes: 5.7 - 6.4         Diabetes: >6.4         Glycemic control for adults with diabetes: <7.0    Mean Plasma Glucose 120 mg/dL    Comment: (NOTE) Performed At: [facility] [place], [place] [id number] Lindon Romp MD SX:[id number]   Troponin I (q 6hr x 3)     Status: Abnormal   Collection Time: 04/10/16  7:22 PM  Result Value Ref Range   Troponin I 0.03 (HH) <0.03 ng/mL    Comment: CRITICAL RESULT CALLED TO, READ BACK BY AND VERIFIED WITHKalman Drape RN [phone] [id number] GREEN R   Urinalysis, Routine w reflex microscopic     Status: Abnormal   Collection Time: 04/10/16 10:21 PM  Result Value Ref Range   Color, Urine STRAW (A) YELLOW   APPearance CLEAR CLEAR   Specific Gravity, Urine 1.010 1.005 - 1.030  pH 5.0 5.0 - 8.0   Glucose, UA NEGATIVE NEGATIVE mg/dL   Hgb urine dipstick NEGATIVE NEGATIVE   Bilirubin Urine NEGATIVE NEGATIVE   Ketones, ur NEGATIVE NEGATIVE mg/dL   Protein, ur NEGATIVE NEGATIVE mg/dL   Nitrite NEGATIVE NEGATIVE   Leukocytes, UA NEGATIVE NEGATIVE  Urine rapid drug screen (hosp performed)     Status: Abnormal   Collection Time: 04/10/16 10:21 PM  Result Value Ref Range   Opiates POSITIVE (A) NONE DETECTED   Cocaine NONE DETECTED NONE DETECTED   Benzodiazepines NONE DETECTED NONE DETECTED   Amphetamines NONE DETECTED NONE DETECTED   Tetrahydrocannabinol NONE DETECTED NONE DETECTED   Barbiturates NONE DETECTED NONE DETECTED    Comment:        DRUG SCREEN FOR MEDICAL PURPOSES ONLY.  IF CONFIRMATION IS NEEDED FOR ANY PURPOSE, NOTIFY LAB WITHIN 5 DAYS.        LOWEST DETECTABLE LIMITS FOR URINE DRUG SCREEN Drug Class       Cutoff (ng/mL) Amphetamine      1000 Barbiturate      200 Benzodiazepine   347 Tricyclics       425 Opiates          300 Cocaine          300 THC              50   Troponin I (q 6hr x 3)     Status: None   Collection Time: 04/10/16 11:10 PM  Result Value Ref Range   Troponin I <0.03 <0.03 ng/mL   Basic metabolic panel     Status: Abnormal   Collection Time: 04/11/16  6:55 AM  Result Value Ref Range   Sodium 144 135 - 145 mmol/L   Potassium 3.8 3.5 - 5.1 mmol/L   Chloride 96 (L) 101 - 111 mmol/L   CO2 39 (H) 22 - 32 mmol/L   Glucose, Bld 88 65 - 99 mg/dL   BUN 27 (H) 6 - 20 mg/dL   Creatinine, Ser 1.33 (H) 0.44 - 1.00 mg/dL   Calcium 8.9 8.9 - 10.3 mg/dL   GFR calc non Af Amer 44 (L) >60 mL/min   GFR calc Af Amer 51 (L) >60 mL/min    Comment: (NOTE) The eGFR has been calculated using the CKD EPI equation. This calculation has not been validated in all clinical situations. eGFR's persistently <60 mL/min signify possible Chronic Kidney Disease.    Anion gap 9 5 - 15  CBC     Status: Abnormal   Collection Time: 04/11/16  6:55 AM  Result Value Ref Range   WBC 10.6 (H) 4.0 - 10.5 K/uL   RBC 3.91 3.87 - 5.11 MIL/uL   Hemoglobin 10.8 (L) 12.0 - 15.0 g/dL   HCT 36.7 36.0 - 46.0 %   MCV 93.9 78.0 - 100.0 fL   MCH 27.6 26.0 - 34.0 pg   MCHC 29.4 (L) 30.0 - 36.0 g/dL   RDW 16.5 (H) 11.5 - 15.5 %   Platelets 403 (H) 150 - 400 K/uL  Troponin I (q 6hr x 3)     Status: None   Collection Time: 04/11/16  6:55 AM  Result Value Ref Range   Troponin I <0.03 <0.03 ng/mL  Glucose, capillary     Status: None   Collection Time: 04/11/16  8:05 AM  Result Value Ref Range   Glucose-Capillary 87 65 - 99 mg/dL  ANA w/Reflex if Positive     Status: None   Collection  Time: 04/11/16  2:17 PM  Result Value Ref Range   Anit Nuclear Antibody(ANA) Negative Negative    Comment: (NOTE) Performed At: [facility] [place], [place] [id number] Lindon Romp MD [id number]   Anti-scleroderma antibody     Status: None   Collection Time: 04/11/16  2:17 PM  Result Value Ref Range   Scleroderma (Scl-70) (ENA) Antibody, IgG <0.2 0.0 - 0.9 AI    Comment: (NOTE) Performed At: [facility] [place], [place] [id number] Lindon Romp MD MD:[id number]    Parkin extractable nuclear antibody     Status: None   Collection Time: 04/11/16  2:17 PM  Result Value Ref Range   SSA (Ro) (ENA) Antibody, IgG <0.2 0.0 - 0.9 AI    Comment: (NOTE) Performed At: [facility] [place], [place] [id number] Lindon Romp MD [id number]   Boley syndrome-B extractable nuclear antibody     Status: None   Collection Time: 04/11/16  2:17 PM  Result Value Ref Range   SSB (La) (ENA) Antibody, IgG <0.2 0.0 - 0.9 AI    Comment: (NOTE) Performed At: [facility] [place], [place] [id number] Lindon Romp MD [id number]   Rheumatoid factor     Status: None   Collection Time: 04/11/16  2:17 PM  Result Value Ref Range   Rhuematoid fact SerPl-aCnc <10.0 0.0 - 13.9 IU/mL    Comment: (NOTE) Performed At: [facility] [place], [place] [id number] Lindon Romp MD [id number]   Anti-DNA antibody, double-stranded     Status: None   Collection Time: 04/11/16  2:17 PM  Result Value Ref Range   ds DNA Ab <1 0 - 9 IU/mL    Comment: (NOTE)                                   Negative      <5                                   Equivocal  5 - 9                                   Positive      >9 Performed At: [facility] [address] [place], [place] [id number] Lindon Romp MD [id number]   Glucose, capillary     Status: None   Collection Time: 04/12/16  8:12 AM  Result Value Ref Range   Glucose-Capillary 96 65 - 99 mg/dL  Basic metabolic panel     Status: Abnormal   Collection Time: 04/12/16  9:38 AM  Result Value Ref Range   Sodium 146 (H) 135 - 145 mmol/L   Potassium 4.5 3.5 - 5.1 mmol/L   Chloride 96 (L) 101 - 111 mmol/L   CO2 42 (H) 22 - 32 mmol/L   Glucose, Bld 100 (H) 65 - 99 mg/dL   BUN 27 (H) 6 - 20 mg/dL   Creatinine, Ser 1.37 (H) 0.44 - 1.00 mg/dL   Calcium 8.8 (L) 8.9 - 10.3 mg/dL   GFR calc non Af Amer 42 (L) >60 mL/min   GFR calc Af  Amer 49 (L) >60 mL/min    Comment: (NOTE) The eGFR has  been calculated using the CKD EPI equation. This calculation has not been validated in all clinical situations. eGFR's persistently <60 mL/min signify possible Chronic Kidney Disease.    Anion gap 8 5 - 15  CBC     Status: Abnormal   Collection Time: 04/12/16  9:38 AM  Result Value Ref Range   WBC 8.8 4.0 - 10.5 K/uL   RBC 4.23 3.87 - 5.11 MIL/uL   Hemoglobin 11.7 (L) 12.0 - 15.0 g/dL   HCT 40.1 36.0 - 46.0 %   MCV 94.8 78.0 - 100.0 fL   MCH 27.7 26.0 - 34.0 pg   MCHC 29.2 (L) 30.0 - 36.0 g/dL   RDW 16.1 (H) 11.5 - 15.5 %   Platelets 440 (H) 150 - 400 K/uL  Pulmonary Function Test     Status: None (Preliminary result)   Collection Time: 04/12/16 11:18 AM  Result Value Ref Range   FVC-Pre 1.70 L   FVC-%Pred-Pre 63 %   FEV1-Pre 1.13 L   FEV1-%Pred-Pre 53 %   FEV6-Pre 1.70 L   FEV6-%Pred-Pre 65 %   Pre FEV1/FVC ratio 66 %   FEV1FVC-%Pred-Pre 82 %   Pre FEV6/FVC Ratio 100 %   FEV6FVC-%Pred-Pre 103 %   FEF 25-75 Pre 0.65 L/sec   FEF2575-%Pred-Pre 30 %  Blood gas, arterial     Status: Abnormal   Collection Time: 04/12/16 11:25 AM  Result Value Ref Range   O2 Content 6.0 L/min   Delivery systems NASAL CANNULA    pH, Arterial 7.337 (L) 7.350 - 7.450   pCO2 arterial 74.5 (HH) 32.0 - 48.0 mmHg    Comment: CRITICAL RESULT CALLED TO, READ BACK BY AND VERIFIED WITH: COURTNEY ORTIN,RN AT 1240, BY NICKY DICK,RRT,RCP    pO2, Arterial 92.3 83.0 - 108.0 mmHg   Bicarbonate 38.9 (H) 20.0 - 28.0 mmol/L   Acid-Base Excess 12.7 (H) 0.0 - 2.0 mmol/L   O2 Saturation 95.9 %   Patient temperature 98.4    Collection site RIGHT RADIAL    Drawn by [id number]    Sample type ARTERIAL DRAW    Allens test (pass/fail) PASS PASS  CBC     Status: Abnormal   Collection Time: 04/13/16  6:49 AM  Result Value Ref Range   WBC 13.8 (H) 4.0 - 10.5 K/uL   RBC 4.38 3.87 - 5.11 MIL/uL   Hemoglobin 12.1 12.0 - 15.0 g/dL   HCT 40.7 36.0 - 46.0 %   MCV  92.9 78.0 - 100.0 fL   MCH 27.6 26.0 - 34.0 pg   MCHC 29.7 (L) 30.0 - 36.0 g/dL   RDW 15.7 (H) 11.5 - 15.5 %   Platelets 438 (H) 150 - 400 K/uL  Basic metabolic panel     Status: Abnormal   Collection Time: 04/13/16  6:49 AM  Result Value Ref Range   Sodium 143 135 - 145 mmol/L   Potassium 4.9 3.5 - 5.1 mmol/L   Chloride 97 (L) 101 - 111 mmol/L   CO2 37 (H) 22 - 32 mmol/L   Glucose, Bld 141 (H) 65 - 99 mg/dL   BUN 25 (H) 6 - 20 mg/dL   Creatinine, Ser 1.31 (H) 0.44 - 1.00 mg/dL   Calcium 9.5 8.9 - 10.3 mg/dL   GFR calc non Af Amer 45 (L) >60 mL/min   GFR calc Af Amer 52 (L) >60 mL/min    Comment: (NOTE) The eGFR has been calculated using the CKD EPI equation. This calculation has not been validated in  all clinical situations. eGFR's persistently <60 mL/min signify possible Chronic Kidney Disease.    Anion gap 9 5 - 15  Glucose, capillary     Status: Abnormal   Collection Time: 04/13/16  7:52 AM  Result Value Ref Range   Glucose-Capillary 154 (H) 65 - 99 mg/dL  CBC     Status: Abnormal   Collection Time: 04/14/16  5:29 AM  Result Value Ref Range   WBC 15.1 (H) 4.0 - 10.5 K/uL   RBC 3.97 3.87 - 5.11 MIL/uL   Hemoglobin 11.0 (L) 12.0 - 15.0 g/dL   HCT 36.8 36.0 - 46.0 %   MCV 92.7 78.0 - 100.0 fL   MCH 27.7 26.0 - 34.0 pg   MCHC 29.9 (L) 30.0 - 36.0 g/dL   RDW 16.0 (H) 11.5 - 15.5 %   Platelets 400 150 - 400 K/uL  Basic metabolic panel     Status: Abnormal   Collection Time: 04/14/16  5:29 AM  Result Value Ref Range   Sodium 142 135 - 145 mmol/L   Potassium 4.3 3.5 - 5.1 mmol/L   Chloride 95 (L) 101 - 111 mmol/L   CO2 40 (H) 22 - 32 mmol/L   Glucose, Bld 105 (H) 65 - 99 mg/dL   BUN 27 (H) 6 - 20 mg/dL   Creatinine, Ser 1.26 (H) 0.44 - 1.00 mg/dL   Calcium 9.7 8.9 - 10.3 mg/dL   GFR calc non Af Amer 47 (L) >60 mL/min   GFR calc Af Amer 54 (L) >60 mL/min    Comment: (NOTE) The eGFR has been calculated using the CKD EPI equation. This calculation has not been  validated in all clinical situations. eGFR's persistently <60 mL/min signify possible Chronic Kidney Disease.    Anion gap 7 5 - 15  Glucose, capillary     Status: Abnormal   Collection Time: 04/14/16  7:44 AM  Result Value Ref Range   Glucose-Capillary 128 (H) 65 - 99 mg/dL  Brain natriuretic peptide     Status: None   Collection Time: 04/14/16 11:51 AM  Result Value Ref Range   B Natriuretic Peptide 26.2 0.0 - 100.0 pg/mL  Glucose, capillary     Status: None   Collection Time: 04/15/16  8:01 AM  Result Value Ref Range   Glucose-Capillary 84 65 - 99 mg/dL  Basic metabolic panel     Status: Abnormal   Collection Time: 04/15/16 10:20 AM  Result Value Ref Range   Sodium 139 135 - 145 mmol/L   Potassium 3.6 3.5 - 5.1 mmol/L   Chloride 91 (L) 101 - 111 mmol/L   CO2 38 (H) 22 - 32 mmol/L   Glucose, Bld 85 65 - 99 mg/dL   BUN 31 (H) 6 - 20 mg/dL   Creatinine, Ser 1.46 (H) 0.44 - 1.00 mg/dL   Calcium 9.5 8.9 - 10.3 mg/dL   GFR calc non Af Amer 39 (L) >60 mL/min   GFR calc Af Amer 45 (L) >60 mL/min    Comment: (NOTE) The eGFR has been calculated using the CKD EPI equation. This calculation has not been validated in all clinical situations. eGFR's persistently <60 mL/min signify possible Chronic Kidney Disease.    Anion gap 10 5 - 15  Basic metabolic panel     Status: Abnormal   Collection Time: 04/19/16  9:40 AM  Result Value Ref Range   Glucose 89 65 - 99 mg/dL   BUN 74 (H) 6 - 24 mg/dL   Creatinine,  Ser 1.97 (H) 0.57 - 1.00 mg/dL   GFR calc non Af Amer 28 (L) >59 mL/min/1.73   GFR calc Af Amer 32 (L) >59 mL/min/1.73   BUN/Creatinine Ratio 38 (H) 9 - 23   Sodium 145 (H) 134 - 144 mmol/L   Potassium 3.3 (L) 3.5 - 5.2 mmol/L   Chloride 91 (L) 96 - 106 mmol/L   CO2 34 (H) 18 - 29 mmol/L   Calcium 10.4 (H) 8.7 - 10.2 mg/dL  Basic metabolic panel     Status: Abnormal   Collection Time: 04/21/16  3:31 PM  Result Value Ref Range   Sodium 144 135 - 145 mmol/L   Potassium 2.5  (LL) 3.5 - 5.1 mmol/L    Comment: CRITICAL RESULT CALLED TO, READ BACK BY AND VERIFIED WITH: H DOOLEY,RN 1622 04/21/16 D BRADLEY    Chloride 91 (L) 101 - 111 mmol/L   CO2 33 (H) 22 - 32 mmol/L   Glucose, Bld 186 (H) 65 - 99 mg/dL   BUN 56 (H) 6 - 20 mg/dL   Creatinine, Ser 2.06 (H) 0.44 - 1.00 mg/dL   Calcium 9.3 8.9 - 10.3 mg/dL   GFR calc non Af Amer 26 (L) >60 mL/min   GFR calc Af Amer 30 (L) >60 mL/min    Comment: (NOTE) The eGFR has been calculated using the CKD EPI equation. This calculation has not been validated in all clinical situations. eGFR's persistently <60 mL/min signify possible Chronic Kidney Disease.    Anion gap 20 (H) 5 - 15  CBC     Status: Abnormal   Collection Time: 04/21/16  3:31 PM  Result Value Ref Range   WBC 14.1 (H) 4.0 - 10.5 K/uL   RBC 4.33 3.87 - 5.11 MIL/uL   Hemoglobin 11.9 (L) 12.0 - 15.0 g/dL   HCT 40.1 36.0 - 46.0 %   MCV 92.6 78.0 - 100.0 fL   MCH 27.5 26.0 - 34.0 pg   MCHC 29.7 (L) 30.0 - 36.0 g/dL   RDW 15.7 (H) 11.5 - 15.5 %   Platelets 392 150 - 400 K/uL  Urinalysis, Routine w reflex microscopic     Status: Abnormal   Collection Time: 04/21/16  6:42 PM  Result Value Ref Range   Color, Urine YELLOW YELLOW   APPearance HAZY (A) CLEAR   Specific Gravity, Urine 1.011 1.005 - 1.030   pH 5.0 5.0 - 8.0   Glucose, UA NEGATIVE NEGATIVE mg/dL   Hgb urine dipstick NEGATIVE NEGATIVE   Bilirubin Urine NEGATIVE NEGATIVE   Ketones, ur NEGATIVE NEGATIVE mg/dL   Protein, ur NEGATIVE NEGATIVE mg/dL   Nitrite NEGATIVE NEGATIVE   Leukocytes, UA MODERATE (A) NEGATIVE   RBC / HPF 0-5 0 - 5 RBC/hpf   WBC, UA 6-30 0 - 5 WBC/hpf   Bacteria, UA RARE (A) NONE SEEN   Squamous Epithelial / LPF 0-5 (A) NONE SEEN   Mucous PRESENT    Hyaline Casts, UA PRESENT   Urine rapid drug screen (hosp performed)     Status: None   Collection Time: 04/21/16  6:42 PM  Result Value Ref Range   Opiates NONE DETECTED NONE DETECTED   Cocaine NONE DETECTED NONE DETECTED    Benzodiazepines NONE DETECTED NONE DETECTED   Amphetamines NONE DETECTED NONE DETECTED   Tetrahydrocannabinol NONE DETECTED NONE DETECTED   Barbiturates NONE DETECTED NONE DETECTED    Comment:        DRUG SCREEN FOR MEDICAL PURPOSES ONLY.  IF CONFIRMATION IS NEEDED  FOR ANY PURPOSE, NOTIFY LAB WITHIN 5 DAYS.        LOWEST DETECTABLE LIMITS FOR URINE DRUG SCREEN Drug Class       Cutoff (ng/mL) Amphetamine      1000 Barbiturate      200 Benzodiazepine   993 Tricyclics       716 Opiates          300 Cocaine          300 THC              50   Glucose, capillary     Status: Abnormal   Collection Time: 04/21/16  8:22 PM  Result Value Ref Range   Glucose-Capillary 110 (H) 65 - 99 mg/dL  Basic metabolic panel     Status: Abnormal   Collection Time: 04/22/16  1:20 AM  Result Value Ref Range   Sodium 143 135 - 145 mmol/L   Potassium 2.6 (LL) 3.5 - 5.1 mmol/L    Comment: CRITICAL RESULT CALLED TO, READ BACK BY AND VERIFIED WITH: FAUCETTE,N RN 04/22/2016 0308 JORDANS    Chloride 91 (L) 101 - 111 mmol/L   CO2 38 (H) 22 - 32 mmol/L   Glucose, Bld 98 65 - 99 mg/dL   BUN 57 (H) 6 - 20 mg/dL   Creatinine, Ser 1.76 (H) 0.44 - 1.00 mg/dL   Calcium 9.2 8.9 - 10.3 mg/dL   GFR calc non Af Amer 31 (L) >60 mL/min   GFR calc Af Amer 36 (L) >60 mL/min    Comment: (NOTE) The eGFR has been calculated using the CKD EPI equation. This calculation has not been validated in all clinical situations. eGFR's persistently <60 mL/min signify possible Chronic Kidney Disease.    Anion gap 14 5 - 15  CK     Status: None   Collection Time: 04/22/16  1:20 AM  Result Value Ref Range   Total CK 127 38 - 234 U/L  Magnesium     Status: None   Collection Time: 04/22/16  1:20 AM  Result Value Ref Range   Magnesium 2.3 1.7 - 2.4 mg/dL  CBC     Status: Abnormal   Collection Time: 04/22/16  1:21 AM  Result Value Ref Range   WBC 11.7 (H) 4.0 - 10.5 K/uL   RBC 3.88 3.87 - 5.11 MIL/uL   Hemoglobin 10.7 (L)  12.0 - 15.0 g/dL   HCT 35.8 (L) 36.0 - 46.0 %   MCV 92.3 78.0 - 100.0 fL   MCH 27.6 26.0 - 34.0 pg   MCHC 29.9 (L) 30.0 - 36.0 g/dL   RDW 15.8 (H) 11.5 - 15.5 %   Platelets 349 150 - 400 K/uL  Glucose, capillary     Status: None   Collection Time: 04/22/16  7:54 AM  Result Value Ref Range   Glucose-Capillary 76 65 - 99 mg/dL  Renal function panel     Status: Abnormal   Collection Time: 04/22/16 10:17 AM  Result Value Ref Range   Sodium 142 135 - 145 mmol/L   Potassium 2.5 (LL) 3.5 - 5.1 mmol/L    Comment: CRITICAL RESULT CALLED TO, READ BACK BY AND VERIFIED WITH: H OMOSEBI,RN [id number] WILDERK    Chloride 92 (L) 101 - 111 mmol/L   CO2 38 (H) 22 - 32 mmol/L   Glucose, Bld 128 (H) 65 - 99 mg/dL   BUN 50 (H) 6 - 20 mg/dL   Creatinine, Ser 1.45 (H) 0.44 - 1.00 mg/dL  Calcium 9.1 8.9 - 10.3 mg/dL   Phosphorus 2.3 (L) 2.5 - 4.6 mg/dL   Albumin 3.2 (L) 3.5 - 5.0 g/dL   GFR calc non Af Amer 39 (L) >60 mL/min   GFR calc Af Amer 46 (L) >60 mL/min    Comment: (NOTE) The eGFR has been calculated using the CKD EPI equation. This calculation has not been validated in all clinical situations. eGFR's persistently <60 mL/min signify possible Chronic Kidney Disease.    Anion gap 12 5 - 15  Glucose, capillary     Status: Abnormal   Collection Time: 04/22/16 12:07 PM  Result Value Ref Range   Glucose-Capillary 112 (H) 65 - 99 mg/dL  Culture, Urine     Status: Abnormal   Collection Time: 04/22/16 12:44 PM  Result Value Ref Range   Specimen Description URINE, RANDOM    Special Requests NONE    Culture MULTIPLE SPECIES PRESENT, SUGGEST RECOLLECTION (A)    Report Status 04/23/2016 FINAL   Glucose, capillary     Status: Abnormal   Collection Time: 04/22/16  5:13 PM  Result Value Ref Range   Glucose-Capillary 135 (H) 65 - 99 mg/dL  Basic metabolic panel (BMP)     Status: Abnormal   Collection Time: 04/22/16  8:38 PM  Result Value Ref Range   Sodium 140 135 - 145 mmol/L   Potassium 2.8  (L) 3.5 - 5.1 mmol/L   Chloride 94 (L) 101 - 111 mmol/L   CO2 37 (H) 22 - 32 mmol/L   Glucose, Bld 121 (H) 65 - 99 mg/dL   BUN 40 (H) 6 - 20 mg/dL   Creatinine, Ser 1.52 (H) 0.44 - 1.00 mg/dL   Calcium 9.2 8.9 - 10.3 mg/dL   GFR calc non Af Amer 37 (L) >60 mL/min   GFR calc Af Amer 43 (L) >60 mL/min    Comment: (NOTE) The eGFR has been calculated using the CKD EPI equation. This calculation has not been validated in all clinical situations. eGFR's persistently <60 mL/min signify possible Chronic Kidney Disease.    Anion gap 9 5 - 15  Glucose, capillary     Status: Abnormal   Collection Time: 04/22/16  9:17 PM  Result Value Ref Range   Glucose-Capillary 103 (H) 65 - 99 mg/dL  Basic metabolic panel (BMP)     Status: Abnormal   Collection Time: 04/23/16  4:23 AM  Result Value Ref Range   Sodium 144 135 - 145 mmol/L   Potassium 3.3 (L) 3.5 - 5.1 mmol/L   Chloride 98 (L) 101 - 111 mmol/L   CO2 34 (H) 22 - 32 mmol/L   Glucose, Bld 85 65 - 99 mg/dL   BUN 35 (H) 6 - 20 mg/dL   Creatinine, Ser 1.15 (H) 0.44 - 1.00 mg/dL   Calcium 8.9 8.9 - 10.3 mg/dL   GFR calc non Af Amer 52 (L) >60 mL/min   GFR calc Af Amer >60 >60 mL/min    Comment: (NOTE) The eGFR has been calculated using the CKD EPI equation. This calculation has not been validated in all clinical situations. eGFR's persistently <60 mL/min signify possible Chronic Kidney Disease.    Anion gap 12 5 - 15  CBC     Status: Abnormal   Collection Time: 04/23/16  4:23 AM  Result Value Ref Range   WBC 10.6 (H) 4.0 - 10.5 K/uL   RBC 3.77 (L) 3.87 - 5.11 MIL/uL   Hemoglobin 10.4 (L) 12.0 - 15.0 g/dL  HCT 34.5 (L) 36.0 - 46.0 %   MCV 91.5 78.0 - 100.0 fL   MCH 27.6 26.0 - 34.0 pg   MCHC 30.1 30.0 - 36.0 g/dL   RDW 15.7 (H) 11.5 - 15.5 %   Platelets 306 150 - 400 K/uL  Magnesium     Status: None   Collection Time: 04/23/16  4:23 AM  Result Value Ref Range   Magnesium 2.4 1.7 - 2.4 mg/dL  Glucose, capillary     Status: None    Collection Time: 04/23/16  7:42 AM  Result Value Ref Range   Glucose-Capillary 96 65 - 99 mg/dL  Glucose, capillary     Status: None   Collection Time: 04/23/16 12:24 PM  Result Value Ref Range   Glucose-Capillary 83 65 - 99 mg/dL  Basic metabolic panel     Status: Abnormal   Collection Time: 04/23/16  2:24 PM  Result Value Ref Range   Sodium 142 135 - 145 mmol/L   Potassium 3.6 3.5 - 5.1 mmol/L   Chloride 101 101 - 111 mmol/L   CO2 34 (H) 22 - 32 mmol/L   Glucose, Bld 121 (H) 65 - 99 mg/dL   BUN 27 (H) 6 - 20 mg/dL   Creatinine, Ser 1.08 (H) 0.44 - 1.00 mg/dL   Calcium 9.3 8.9 - 10.3 mg/dL   GFR calc non Af Amer 56 (L) >60 mL/min   GFR calc Af Amer >60 >60 mL/min    Comment: (NOTE) The eGFR has been calculated using the CKD EPI equation. This calculation has not been validated in all clinical situations. eGFR's persistently <60 mL/min signify possible Chronic Kidney Disease.    Anion gap 7 5 - 15  Glucose, capillary     Status: Abnormal   Collection Time: 04/23/16  5:30 PM  Result Value Ref Range   Glucose-Capillary 103 (H) 65 - 99 mg/dL  Glucose, capillary     Status: Abnormal   Collection Time: 04/23/16 10:37 PM  Result Value Ref Range   Glucose-Capillary 106 (H) 65 - 99 mg/dL  CBC     Status: Abnormal   Collection Time: 04/24/16  5:30 AM  Result Value Ref Range   WBC 10.4 4.0 - 10.5 K/uL   RBC 3.94 3.87 - 5.11 MIL/uL   Hemoglobin 11.1 (L) 12.0 - 15.0 g/dL   HCT 36.1 36.0 - 46.0 %   MCV 91.6 78.0 - 100.0 fL   MCH 28.2 26.0 - 34.0 pg   MCHC 30.7 30.0 - 36.0 g/dL   RDW 15.8 (H) 11.5 - 15.5 %   Platelets 323 150 - 400 K/uL  Basic metabolic panel     Status: Abnormal   Collection Time: 04/24/16  5:30 AM  Result Value Ref Range   Sodium 142 135 - 145 mmol/L   Potassium 3.7 3.5 - 5.1 mmol/L   Chloride 100 (L) 101 - 111 mmol/L   CO2 32 22 - 32 mmol/L   Glucose, Bld 88 65 - 99 mg/dL   BUN 22 (H) 6 - 20 mg/dL   Creatinine, Ser 1.08 (H) 0.44 - 1.00 mg/dL   Calcium  9.4 8.9 - 10.3 mg/dL   GFR calc non Af Amer 56 (L) >60 mL/min   GFR calc Af Amer >60 >60 mL/min    Comment: (NOTE) The eGFR has been calculated using the CKD EPI equation. This calculation has not been validated in all clinical situations. eGFR's persistently <60 mL/min signify possible Chronic Kidney Disease.    Anion  gap 10 5 - 15  Glucose, capillary     Status: None   Collection Time: 04/24/16  8:11 AM  Result Value Ref Range   Glucose-Capillary 83 65 - 99 mg/dL  Glucose, capillary     Status: None   Collection Time: 04/24/16 12:32 PM  Result Value Ref Range   Glucose-Capillary 88 65 - 99 mg/dL  Basic metabolic panel     Status: Abnormal   Collection Time: 05/09/16  4:19 AM  Result Value Ref Range   Sodium 141 135 - 145 mmol/L   Potassium 3.7 3.5 - 5.1 mmol/L   Chloride 99 (L) 101 - 111 mmol/L   CO2 31 22 - 32 mmol/L   Glucose, Bld 105 (H) 65 - 99 mg/dL   BUN 18 6 - 20 mg/dL   Creatinine, Ser 1.08 (H) 0.44 - 1.00 mg/dL   Calcium 9.4 8.9 - 10.3 mg/dL   GFR calc non Af Amer 56 (L) >60 mL/min   GFR calc Af Amer >60 >60 mL/min    Comment: (NOTE) The eGFR has been calculated using the CKD EPI equation. This calculation has not been validated in all clinical situations. eGFR's persistently <60 mL/min signify possible Chronic Kidney Disease.    Anion gap 11 5 - 15  CBC     Status: Abnormal   Collection Time: 05/09/16  4:19 AM  Result Value Ref Range   WBC 11.9 (H) 4.0 - 10.5 K/uL   RBC 3.69 (L) 3.87 - 5.11 MIL/uL   Hemoglobin 10.2 (L) 12.0 - 15.0 g/dL   HCT 34.6 (L) 36.0 - 46.0 %   MCV 93.8 78.0 - 100.0 fL   MCH 27.6 26.0 - 34.0 pg   MCHC 29.5 (L) 30.0 - 36.0 g/dL   RDW 15.7 (H) 11.5 - 15.5 %   Platelets 257 150 - 400 K/uL  Brain natriuretic peptide     Status: None   Collection Time: 05/09/16  4:19 AM  Result Value Ref Range   B Natriuretic Peptide 15.4 0.0 - 100.0 pg/mL  Hepatic function panel     Status: Abnormal   Collection Time: 05/09/16  4:19 AM  Result  Value Ref Range   Total Protein 6.1 (L) 6.5 - 8.1 g/dL   Albumin 3.4 (L) 3.5 - 5.0 g/dL   AST 22 15 - 41 U/L   ALT 18 14 - 54 U/L   Alkaline Phosphatase 101 38 - 126 U/L   Total Bilirubin 0.3 0.3 - 1.2 mg/dL   Bilirubin, Direct <0.1 (L) 0.1 - 0.5 mg/dL   Indirect Bilirubin NOT CALCULATED 0.3 - 0.9 mg/dL  Lipase, blood     Status: None   Collection Time: 05/09/16  4:19 AM  Result Value Ref Range   Lipase 33 11 - 51 U/L  I-stat troponin, ED     Status: None   Collection Time: 05/09/16  4:30 AM  Result Value Ref Range   Troponin i, poc 0.00 0.00 - 0.08 ng/mL   Comment 3            Comment: Due to the release kinetics of cTnI, a negative result within the first hours of the onset of symptoms does not rule out myocardial infarction with certainty. If myocardial infarction is still suspected, repeat the test at appropriate intervals.   Glucose, capillary     Status: Abnormal   Collection Time: 05/09/16 11:35 AM  Result Value Ref Range   Glucose-Capillary 112 (H) 65 - 99 mg/dL   Comment  1 Notify RN   Protime-INR     Status: None   Collection Time: 05/09/16  4:25 PM  Result Value Ref Range   Prothrombin Time 13.2 11.4 - 15.2 seconds   INR 1.00   Glucose, capillary     Status: None   Collection Time: 05/09/16  4:30 PM  Result Value Ref Range   Glucose-Capillary 95 65 - 99 mg/dL   Comment 1 Notify RN   Glucose, capillary     Status: None   Collection Time: 05/09/16  9:52 PM  Result Value Ref Range   Glucose-Capillary 97 65 - 99 mg/dL  Basic metabolic panel     Status: Abnormal   Collection Time: 05/10/16  6:44 AM  Result Value Ref Range   Sodium 138 135 - 145 mmol/L   Potassium 3.6 3.5 - 5.1 mmol/L   Chloride 99 (L) 101 - 111 mmol/L   CO2 34 (H) 22 - 32 mmol/L   Glucose, Bld 98 65 - 99 mg/dL   BUN 11 6 - 20 mg/dL   Creatinine, Ser 1.01 (H) 0.44 - 1.00 mg/dL   Calcium 9.0 8.9 - 10.3 mg/dL   GFR calc non Af Amer >60 >60 mL/min   GFR calc Af Amer >60 >60 mL/min    Comment:  (NOTE) The eGFR has been calculated using the CKD EPI equation. This calculation has not been validated in all clinical situations. eGFR's persistently <60 mL/min signify possible Chronic Kidney Disease.    Anion gap 5 5 - 15  CBC     Status: Abnormal   Collection Time: 05/10/16  6:44 AM  Result Value Ref Range   WBC 15.8 (H) 4.0 - 10.5 K/uL   RBC 3.34 (L) 3.87 - 5.11 MIL/uL   Hemoglobin 9.6 (L) 12.0 - 15.0 g/dL   HCT 31.2 (L) 36.0 - 46.0 %   MCV 93.4 78.0 - 100.0 fL   MCH 28.7 26.0 - 34.0 pg   MCHC 30.8 30.0 - 36.0 g/dL   RDW 16.2 (H) 11.5 - 15.5 %   Platelets 255 150 - 400 K/uL  Glucose, capillary     Status: None   Collection Time: 05/10/16  7:50 AM  Result Value Ref Range   Glucose-Capillary 82 65 - 99 mg/dL  Glucose, capillary     Status: None   Collection Time: 05/10/16 11:37 AM  Result Value Ref Range   Glucose-Capillary 96 65 - 99 mg/dL  Glucose, capillary     Status: None   Collection Time: 05/10/16  4:18 PM  Result Value Ref Range   Glucose-Capillary 90 65 - 99 mg/dL  Glucose, capillary     Status: None   Collection Time: 05/10/16  7:56 PM  Result Value Ref Range   Glucose-Capillary 98 65 - 99 mg/dL  Glucose, capillary     Status: None   Collection Time: 05/11/16  1:18 AM  Result Value Ref Range   Glucose-Capillary 93 65 - 99 mg/dL  Glucose, capillary     Status: None   Collection Time: 05/11/16  4:03 AM  Result Value Ref Range   Glucose-Capillary 91 65 - 99 mg/dL  CBC     Status: Abnormal   Collection Time: 05/11/16  5:25 AM  Result Value Ref Range   WBC 11.3 (H) 4.0 - 10.5 K/uL   RBC 3.65 (L) 3.87 - 5.11 MIL/uL   Hemoglobin 10.3 (L) 12.0 - 15.0 g/dL   HCT 33.5 (L) 36.0 - 46.0 %   MCV 91.8 78.0 -  100.0 fL   MCH 28.2 26.0 - 34.0 pg   MCHC 30.7 30.0 - 36.0 g/dL   RDW 16.4 (H) 11.5 - 15.5 %   Platelets 281 150 - 400 K/uL  Basic metabolic panel     Status: Abnormal   Collection Time: 05/11/16  5:25 AM  Result Value Ref Range   Sodium 139 135 - 145  mmol/L   Potassium 3.3 (L) 3.5 - 5.1 mmol/L   Chloride 97 (L) 101 - 111 mmol/L   CO2 32 22 - 32 mmol/L   Glucose, Bld 89 65 - 99 mg/dL   BUN 11 6 - 20 mg/dL   Creatinine, Ser 1.10 (H) 0.44 - 1.00 mg/dL   Calcium 9.5 8.9 - 10.3 mg/dL   GFR calc non Af Amer 55 (L) >60 mL/min   GFR calc Af Amer >60 >60 mL/min    Comment: (NOTE) The eGFR has been calculated using the CKD EPI equation. This calculation has not been validated in all clinical situations. eGFR's persistently <60 mL/min signify possible Chronic Kidney Disease.    Anion gap 10 5 - 15  Glucose, capillary     Status: None   Collection Time: 05/11/16  7:52 AM  Result Value Ref Range   Glucose-Capillary 91 65 - 99 mg/dL  Valproic acid level     Status: Abnormal   Collection Time: 05/11/16  8:08 AM  Result Value Ref Range   Valproic Acid Lvl 18 (L) 50.0 - 100.0 ug/mL  Glucose, capillary     Status: None   Collection Time: 05/11/16 11:54 AM  Result Value Ref Range   Glucose-Capillary 91 65 - 99 mg/dL  Glucose, capillary     Status: Abnormal   Collection Time: 05/11/16  6:13 PM  Result Value Ref Range   Glucose-Capillary 135 (H) 65 - 99 mg/dL  Glucose, capillary     Status: None   Collection Time: 05/11/16 10:14 PM  Result Value Ref Range   Glucose-Capillary 90 65 - 99 mg/dL  CBC     Status: Abnormal   Collection Time: 05/12/16  5:34 AM  Result Value Ref Range   WBC 11.1 (H) 4.0 - 10.5 K/uL   RBC 3.52 (L) 3.87 - 5.11 MIL/uL   Hemoglobin 9.9 (L) 12.0 - 15.0 g/dL   HCT 32.7 (L) 36.0 - 46.0 %   MCV 92.9 78.0 - 100.0 fL   MCH 28.1 26.0 - 34.0 pg   MCHC 30.3 30.0 - 36.0 g/dL   RDW 16.3 (H) 11.5 - 15.5 %   Platelets 336 150 - 400 K/uL  Basic metabolic panel     Status: Abnormal   Collection Time: 05/12/16  5:34 AM  Result Value Ref Range   Sodium 142 135 - 145 mmol/L   Potassium 3.7 3.5 - 5.1 mmol/L   Chloride 103 101 - 111 mmol/L   CO2 32 22 - 32 mmol/L   Glucose, Bld 89 65 - 99 mg/dL   BUN 14 6 - 20 mg/dL    Creatinine, Ser 1.24 (H) 0.44 - 1.00 mg/dL   Calcium 8.5 (L) 8.9 - 10.3 mg/dL   GFR calc non Af Amer 47 (L) >60 mL/min   GFR calc Af Amer 55 (L) >60 mL/min    Comment: (NOTE) The eGFR has been calculated using the CKD EPI equation. This calculation has not been validated in all clinical situations. eGFR's persistently <60 mL/min signify possible Chronic Kidney Disease.    Anion gap 7 5 - 15  Glucose,  capillary     Status: None   Collection Time: 05/12/16  7:22 AM  Result Value Ref Range   Glucose-Capillary 99 65 - 99 mg/dL  Glucose, capillary     Status: None   Collection Time: 05/12/16 11:26 AM  Result Value Ref Range   Glucose-Capillary 98 65 - 99 mg/dL  Basic metabolic panel     Status: Abnormal   Collection Time: 05/17/16  5:03 PM  Result Value Ref Range   Glucose 76 65 - 99 mg/dL   BUN 24 6 - 24 mg/dL   Creatinine, Ser 1.58 (H) 0.57 - 1.00 mg/dL   GFR calc non Af Amer 36 (L) >59 mL/min/1.73   GFR calc Af Amer 42 (L) >59 mL/min/1.73   BUN/Creatinine Ratio 15 9 - 23   Sodium 145 (H) 134 - 144 mmol/L   Potassium 5.3 (H) 3.5 - 5.2 mmol/L   Chloride 101 96 - 106 mmol/L   CO2 32 (H) 18 - 29 mmol/L   Calcium 10.0 8.7 - 10.2 mg/dL  Basic Metabolic Panel     Status: Abnormal   Collection Time: 05/24/16  4:43 PM  Result Value Ref Range   Glucose 122 (H) 65 - 99 mg/dL    Comment: Specimen received in contact with cells. No visible hemolysis present. However GLUC may be decreased and K increased. Clinical correlation indicated.    BUN 22 6 - 24 mg/dL   Creatinine, Ser 1.26 (H) 0.57 - 1.00 mg/dL   GFR calc non Af Amer 47 (L) >59 mL/min/1.73   GFR calc Af Amer 55 (L) >59 mL/min/1.73   BUN/Creatinine Ratio 17 9 - 23   Sodium 147 (H) 134 - 144 mmol/L   Potassium 4.6 3.5 - 5.2 mmol/L    Comment: Specimen received in contact with cells. No visible hemolysis present. However GLUC may be decreased and K increased. Clinical correlation indicated.    Chloride 99 96 - 106 mmol/L    CO2 32 (H) 18 - 29 mmol/L   Calcium 9.4 8.7 - 10.2 mg/dL  Basic metabolic panel     Status: Abnormal   Collection Time: 05/30/16  4:52 PM  Result Value Ref Range   Sodium 139 135 - 145 mmol/L   Potassium 4.0 3.5 - 5.1 mmol/L   Chloride 98 (L) 101 - 111 mmol/L   CO2 34 (H) 22 - 32 mmol/L   Glucose, Bld 84 65 - 99 mg/dL   BUN 14 6 - 20 mg/dL   Creatinine, Ser 1.09 (H) 0.44 - 1.00 mg/dL   Calcium 9.5 8.9 - 10.3 mg/dL   GFR calc non Af Amer 55 (L) >60 mL/min   GFR calc Af Amer >60 >60 mL/min    Comment: (NOTE) The eGFR has been calculated using the CKD EPI equation. This calculation has not been validated in all clinical situations. eGFR's persistently <60 mL/min signify possible Chronic Kidney Disease.    Anion gap 7 5 - 15  CBC     Status: Abnormal   Collection Time: 05/30/16  4:52 PM  Result Value Ref Range   WBC 11.1 (H) 4.0 - 10.5 K/uL   RBC 3.57 (L) 3.87 - 5.11 MIL/uL   Hemoglobin 9.9 (L) 12.0 - 15.0 g/dL   HCT 33.7 (L) 36.0 - 46.0 %   MCV 94.4 78.0 - 100.0 fL   MCH 27.7 26.0 - 34.0 pg   MCHC 29.4 (L) 30.0 - 36.0 g/dL   RDW 16.3 (H) 11.5 - 15.5 %  Platelets 426 (H) 150 - 400 K/uL  I-stat troponin, ED     Status: None   Collection Time: 05/30/16  5:18 PM  Result Value Ref Range   Troponin i, poc 0.00 0.00 - 0.08 ng/mL   Comment 3            Comment: Due to the release kinetics of cTnI, a negative result within the first hours of the onset of symptoms does not rule out myocardial infarction with certainty. If myocardial infarction is still suspected, repeat the test at appropriate intervals.   Brain natriuretic peptide     Status: None   Collection Time: 06/02/16 11:50 AM  Result Value Ref Range   BNP 22.3 0.0 - 100.0 pg/mL  Basic Metabolic Panel     Status: Abnormal   Collection Time: 06/02/16 11:50 AM  Result Value Ref Range   Glucose 102 (H) 65 - 99 mg/dL   BUN 13 6 - 24 mg/dL   Creatinine, Ser 1.19 (H) 0.57 - 1.00 mg/dL   GFR calc non Af Amer 51 (L) >59  mL/min/1.73   GFR calc Af Amer 59 (L) >59 mL/min/1.73   BUN/Creatinine Ratio 11 9 - 23   Sodium 144 134 - 144 mmol/L   Potassium 4.8 3.5 - 5.2 mmol/L   Chloride 101 96 - 106 mmol/L   CO2 30 (H) 18 - 29 mmol/L   Calcium 9.3 8.7 - 10.2 mg/dL  Basic metabolic panel     Status: Abnormal   Collection Time: 06/06/16  3:21 PM  Result Value Ref Range   Glucose 97 65 - 99 mg/dL   BUN 17 6 - 24 mg/dL   Creatinine, Ser 1.40 (H) 0.57 - 1.00 mg/dL   GFR calc non Af Amer 42 (L) >59 mL/min/1.73   GFR calc Af Amer 48 (L) >59 mL/min/1.73   BUN/Creatinine Ratio 12 9 - 23   Sodium 142 134 - 144 mmol/L   Potassium 4.5 3.5 - 5.2 mmol/L   Chloride 100 96 - 106 mmol/L   CO2 27 18 - 29 mmol/L   Calcium 9.7 8.7 - 10.2 mg/dL  Basic metabolic panel     Status: Abnormal   Collection Time: 06/11/16  8:30 PM  Result Value Ref Range   Sodium 140 135 - 145 mmol/L   Potassium 3.3 (L) 3.5 - 5.1 mmol/L   Chloride 103 101 - 111 mmol/L   CO2 29 22 - 32 mmol/L   Glucose, Bld 123 (H) 65 - 99 mg/dL   BUN 17 6 - 20 mg/dL   Creatinine, Ser 1.32 (H) 0.44 - 1.00 mg/dL   Calcium 9.1 8.9 - 10.3 mg/dL   GFR calc non Af Amer 44 (L) >60 mL/min   GFR calc Af Amer 51 (L) >60 mL/min    Comment: (NOTE) The eGFR has been calculated using the CKD EPI equation. This calculation has not been validated in all clinical situations. eGFR's persistently <60 mL/min signify possible Chronic Kidney Disease.    Anion gap 8 5 - 15  CBC     Status: Abnormal   Collection Time: 06/11/16  8:30 PM  Result Value Ref Range   WBC 9.5 4.0 - 10.5 K/uL   RBC 3.70 (L) 3.87 - 5.11 MIL/uL   Hemoglobin 10.3 (L) 12.0 - 15.0 g/dL   HCT 35.1 (L) 36.0 - 46.0 %   MCV 94.9 78.0 - 100.0 fL   MCH 27.8 26.0 - 34.0 pg   MCHC 29.3 (L) 30.0 - 36.0  g/dL   RDW 16.4 (H) 11.5 - 15.5 %   Platelets 312 150 - 400 K/uL  Troponin I     Status: None   Collection Time: 06/11/16  8:30 PM  Result Value Ref Range   Troponin I <0.03 <0.03 ng/mL  Urinalysis, Routine  w reflex microscopic     Status: Abnormal   Collection Time: 06/11/16 10:33 PM  Result Value Ref Range   Color, Urine YELLOW YELLOW   APPearance CLEAR CLEAR   Specific Gravity, Urine 1.010 1.005 - 1.030   pH 5.0 5.0 - 8.0   Glucose, UA NEGATIVE NEGATIVE mg/dL   Hgb urine dipstick NEGATIVE NEGATIVE   Bilirubin Urine NEGATIVE NEGATIVE   Ketones, ur NEGATIVE NEGATIVE mg/dL   Protein, ur NEGATIVE NEGATIVE mg/dL   Nitrite NEGATIVE NEGATIVE   Leukocytes, UA TRACE (A) NEGATIVE   RBC / HPF 0-5 0 - 5 RBC/hpf   WBC, UA 6-30 0 - 5 WBC/hpf   Bacteria, UA NONE SEEN NONE SEEN   Squamous Epithelial / LPF 6-30 (A) NONE SEEN   Mucous PRESENT   CMP14+EGFR     Status: Abnormal   Collection Time: 06/20/16 10:26 AM  Result Value Ref Range   Glucose 89 65 - 99 mg/dL   BUN 25 (H) 6 - 24 mg/dL   Creatinine, Ser 1.03 (H) 0.57 - 1.00 mg/dL   GFR calc non Af Amer 60 >59 mL/min/1.73   GFR calc Af Amer 70 >59 mL/min/1.73   BUN/Creatinine Ratio 24 (H) 9 - 23   Sodium 144 134 - 144 mmol/L   Potassium 4.4 3.5 - 5.2 mmol/L   Chloride 99 96 - 106 mmol/L   CO2 28 18 - 29 mmol/L    Comment: **Effective June 26, 2016 Carbon Dioxide, Total**   reference interval will be changing to:              Age                  Female          Female      0 days   - 30 days         61 - 51        16 - 62     31 days   -  1 year         15 - 25        15 - 25      2 years  -  5 years        78 - 19        17 - 42      6 years  - 12 years        63 - 16        19 - 41                >12 years        16 - 47        20 - 29    Calcium 9.5 8.7 - 10.2 mg/dL   Total Protein 6.7 6.0 - 8.5 g/dL   Albumin 3.7 3.5 - 5.5 g/dL   Globulin, Total 3.0 1.5 - 4.5 g/dL   Albumin/Globulin Ratio 1.2 1.2 - 2.2   Bilirubin Total <0.2 0.0 - 1.2 mg/dL   Alkaline Phosphatase 130 (H) 39 - 117 IU/L   AST 14 0 - 40 IU/L   ALT 11 0 - 32 IU/L  Specimen Status     Status: None (Preliminary result)   Collection Time: 06/20/16 10:26 AM  Result Value  Ref Range   WBC WILL FOLLOW    RBC WILL FOLLOW    Hemoglobin WILL FOLLOW    Hematocrit WILL FOLLOW    MCV WILL FOLLOW    MCH WILL FOLLOW    MCHC WILL FOLLOW    RDW WILL FOLLOW    Platelets WILL FOLLOW    Neutrophils WILL FOLLOW    Lymphs WILL FOLLOW    Monocytes WILL FOLLOW    Eos WILL FOLLOW    Basos WILL FOLLOW    Neutrophils Absolute WILL FOLLOW    Lymphocytes Absolute WILL FOLLOW    Monocytes Absolute WILL FOLLOW    EOS (ABSOLUTE) WILL FOLLOW    Basophils Absolute WILL FOLLOW    Immature Granulocytes WILL FOLLOW    Immature Grans (Abs) WILL FOLLOW   Pro b natriuretic peptide     Status: None   Collection Time: 06/26/16 10:31 AM  Result Value Ref Range   NT-Pro BNP 57 0 - 287 pg/mL    Comment: The following cut-points have been suggested for the use of proBNP for the diagnostic evaluation of heart failure (HF) in patients with acute dyspnea: Modality                     Age           Optimal Cut                            (years)            Point ------------------------------------------------------ Diagnosis (rule in HF)        <50            450 pg/mL                           50 - 75            900 pg/mL                               >75           1800 pg/mL Exclusion (rule out HF)  Age independent     300 pg/mL   Basic Metabolic Panel (BMET)     Status: Abnormal   Collection Time: 06/26/16 10:31 AM  Result Value Ref Range   Glucose 88 65 - 99 mg/dL   BUN 23 6 - 24 mg/dL   Creatinine, Ser 1.46 (H) 0.57 - 1.00 mg/dL   GFR calc non Af Amer 40 (L) >59 mL/min/1.73   GFR calc Af Amer 46 (L) >59 mL/min/1.73   BUN/Creatinine Ratio 16 9 - 23   Sodium 143 134 - 144 mmol/L   Potassium 4.3 3.5 - 5.2 mmol/L   Chloride 97 96 - 106 mmol/L   CO2 34 (H) 20 - 29 mmol/L    Comment:               **Please note reference interval change**   Calcium 9.3 8.7 - 10.2 mg/dL   Lab Results  Component Value Date   HGBA1C 5.8 (H) 04/10/2016   MPG 120 04/10/2016   MPG 114 11/25/2015    No results found for: PROLACTIN Lab Results  Component Value Date   CHOL 135  12/04/2015   TRIG 90 12/04/2015   HDL 55 12/04/2015   CHOLHDL 2.5 12/04/2015   VLDL 18 12/04/2015   LDLCALC 62 12/04/2015   LDLCALC 54 07/15/2013     Current Medications: Current Outpatient Prescriptions  Medication Sig Dispense Refill  . albuterol (PROVENTIL) (2.5 MG/3ML) 0.083% nebulizer solution Take 3 mLs (2.5 mg total) by nebulization every 6 (six) hours as needed for wheezing or shortness of breath. 150 mL 1  . Alcohol Swabs (B-D SINGLE USE SWABS REGULAR) PADS CHECK CAPILLARY BLOOD GLUCOSE ONE TIME DAILY 100 each 3  . amLODipine (NORVASC) 5 MG tablet Take 1 tablet (5 mg total) by mouth daily. 90 tablet 0  . aspirin 81 MG EC tablet Take 1 tablet (81 mg total) by mouth daily. Swallow whole. 90 tablet 0  . atorvastatin (LIPITOR) 40 MG tablet Take 1 tablet (40 mg total) by mouth daily. 90 tablet 0  . benztropine (COGENTIN) 0.5 MG tablet Take 1 tablet (0.5 mg total) by mouth 2 (two) times daily. 60 tablet 0  . Blood Glucose Monitoring Suppl (ACCU-CHEK AVIVA PLUS) w/Device KIT CHECK BLOOD SUGAR THREE TIMES DAILY 1 kit 0  . divalproex ([name] ER) 500 MG 24 hr tablet Take 2 tablets (1,000 mg total) by mouth at bedtime. 60 tablet 1  . escitalopram (LEXAPRO) 20 MG tablet Take 1 tablet (20 mg total) by mouth daily. 30 tablet 1  . fluticasone furoate-vilanterol (BREO ELLIPTA) 100-25 MCG/INH AEPB INHALE 1 PUFF EVERY DAY 180 each 0  . gabapentin (NEURONTIN) 600 MG tablet Take 1 tablet (600 mg total) by mouth 3 (three) times daily. 270 tablet 0  . glucose blood test strip Use to check blood sugar 3 times daily. 100 each 12  . metFORMIN (GLUCOPHAGE-XR) 500 MG 24 hr tablet Take 1 tablet (500 mg total) by mouth 2 (two) times daily. 180 tablet 0  . mirtazapine (REMERON) 30 MG tablet Take 1 tablet (30 mg total) by mouth at bedtime. 30 tablet 1  . montelukast (SINGULAIR) 10 MG tablet Take 1 tablet (10 mg total) by mouth  at bedtime. 90 tablet 1  . Multiple Vitamin (MULTIVITAMIN WITH MINERALS) TABS tablet Take 1 tablet by mouth daily.    . naproxen (NAPROXEN DR) 500 MG EC tablet Take 1 tablet (500 mg total) by mouth 2 (two) times daily as needed. 60 tablet 0  . omeprazole (PRILOSEC) 40 MG capsule Take 1 capsule (40 mg total) by mouth 2 (two) times daily. 180 capsule 0  . oxybutynin (DITROPAN-XL) 5 MG 24 hr tablet Take 5 mg by mouth at bedtime.    . OXYGEN Inhale 6 L into the lungs daily.    . potassium chloride SA (K-DUR,KLOR-CON) 20 MEQ tablet Take 2 tablets (40 mEq total) by mouth 2 (two) times daily. 120 tablet 0  . tiotropium (SPIRIVA HANDIHALER) 18 MCG inhalation capsule Place 1 capsule (18 mcg total) into inhaler and inhale daily. 90 capsule 1  . torsemide (DEMADEX) 20 MG tablet Take 3 tablets (60 mg total) by mouth 2 (two) times daily. 180 tablet 0  . VENTOLIN HFA 108 (90 Base) MCG/ACT inhaler Inhale 2 puffs into the lungs every 6 (six) hours as needed. As needed for wheezing or sob    . ziprasidone (GEODON) 80 MG capsule Take 1 capsule (80 mg total) by mouth at bedtime. 30 capsule 1   No current facility-administered medications for this visit.     Neurologic: Headache: No Seizure: No Paresthesias: neuropathic pain  Musculoskeletal: Strength & Muscle  Tone: within normal limits Gait & Station: unsteady Patient leans: uses stick for balance  Psychiatric Specialty Exam: Review of Systems  Constitutional: Negative.   HENT: Negative.   Respiratory: Positive for shortness of breath.   Gastrointestinal: Negative.   Musculoskeletal: Positive for back pain and joint pain.  Skin: Negative.  Negative for itching and rash.  Neurological: Positive for tingling.  Psychiatric/Behavioral: The patient has insomnia.     Blood pressure 124/68, height _0  (1.626 m), weight (!) 305 lb (138.3 kg), SpO2 92 %.Body mass index is 52.35 kg/m.  General Appearance: Casual  Eye Contact:  Good  Speech:  Clear and  Coherent  Volume:  Normal  Mood:  Euthymic  Affect:  Congruent  Thought Process:  Goal Directed  Orientation:  Full (Time, Place, and Person)  Thought Content: Rumination   Suicidal Thoughts:  No  Homicidal Thoughts:  No  Memory:  Immediate;   Fair Recent;   Fair Remote;   Fair  Judgement:  Good  Insight:  Good  Psychomotor Activity:  Normal  Concentration:  Concentration: Fair and Attention Span: Fair  Recall:  Good  Fund of Knowledge: Good  Language: Good  Akathisia:  No  Handed:  Right  AIMS (if indicated):  0  Assets:  Communication Skills Desire for Improvement Housing Social Support  ADL's:  Intact  Cognition: WNL  Sleep:  Fair     Assessment: Bipolar disorder type I.  Cocaine dependence in remission.  Plan: I reviewed records and collateral information from other provider including recent blood work results.  Patient continues to have poor sleep.  I would increase Remeron 45 mg at bedtime.  She will continue [name] thousand milligram at bedtime.  We will do [name] level and hemoglobin A1c today.  Continue Lexapro 20 mg daily, Geodon 80 mg at bedtime and Cogentin 0.5 mg at bedtime.  Encourage to continue [place] for counseling.  Discussed medication side effects and benefits.  Recommended to call us back if she has any question or any concern.  Follow-up in 2 months.  ARFEEN,SYED T., MD 06/29/2016, 3:19 PM

## 2016-06-30 ENCOUNTER — Encounter: Payer: Self-pay | Admitting: Interventional Radiology

## 2016-06-30 ENCOUNTER — Other Ambulatory Visit: Payer: Self-pay | Admitting: Pharmacist

## 2016-06-30 ENCOUNTER — Encounter (HOSPITAL_BASED_OUTPATIENT_CLINIC_OR_DEPARTMENT_OTHER): Payer: Self-pay

## 2016-06-30 NOTE — Patient Outreach (Signed)
Tiffany [name]) Care Management  06/30/2016  Tiffany [name] 27-Jan-1960 [id number]   Patient was called to follow up on getting her medications in pill pack.  Unfortunately she did not answer the phone and her voicemail was full so a message could not be left.  [facility] was called.  They have the following medications ready for the patient:  Aspirin 81mg  Naproxen 500mg  Gabapentin 600mg  Potassium 20MEq Omeprapzole 40mg  Benztropine 2mg  Mitrtazapine 45mg  Divalproex 500mg   Acetaminophen 500mg  Montelukast 10mg   The following medications were too soon for pick up:  Metformin Atorvastatin- ok for fill 6/16 Amlodipine -ok for fill 6/17 Breo inhaler-ok for fill 6/22 Spiriva- ok for fill 7/26  [facility] reported Escitalopram was filled today (06/30/16) but they could not tell where it was filled.  Patient was called to see if she picked it up from somewhere but she did not answer her phone.  [facility] has not put the patient's medications in the pack as of yet because they were trying to get as many of her meds in the pack as possible.  Plan:  Follow up with the patient next week.  Tiffany [name], PharmD, [place] Clinical Pharmacist [phone]

## 2016-07-03 ENCOUNTER — Ambulatory Visit: Payer: Self-pay | Admitting: Cardiology

## 2016-07-03 ENCOUNTER — Other Ambulatory Visit: Payer: Self-pay | Admitting: Pharmacist

## 2016-07-03 ENCOUNTER — Encounter: Payer: Self-pay | Admitting: Pharmacist

## 2016-07-03 NOTE — Patient Outreach (Signed)
[facility]) Care Management  07/03/2016  Tiffany [name] [birth year] [id number]  Patient was called to follow up on a pill pack referral.  HIPAA identifiers were obtained.  Patient said she had not heard from [facility].  [facility] was called.  They reported the following medications were ready and being pill packaged for delivery on 07/04/16:  Aspirin 81mg  Naproxen 500mg  Gabapentin 600mg  Potassium 20MEq Omeprazole 40mg  Benztropine 2mg  Mirtazapine 4 Divalproex 5  Acetaminophen 5 Atorvastatin 4  [facility] reported the following medications were too soon for pick up:  Metformin-ok for fill 6/24 Reo inhaler-ok for fill 6/22 Montelukast-07/09/16  The prescriptions have a $0 copy and will be delivered tomorrow.    Patient said she picked up Escitalopram from [facility] on Saturday.    Plan:  I will follow up with the patient via telephone in 2-3 business days to be sure she understands the packing system since she will have some tablets in bottles and some in the packing system.  Elayne Guerin, PharmD, [place] Clinical Pharmacist [phone]

## 2016-07-03 NOTE — Progress Notes (Signed)
Cardiology Office Note    Date:  07/04/2016   ID:  Tiffany [name], DOB 27-Jun-1960, MRN [id number]  PCP:  Archie Patten, MD  Cardiologist:  Dr. Marlou Porch  Chief Complaint: dyspnea follow up  History of Present Illness:   Tiffany [name] is a 56 y.o. female with hx of asthma, COPD on chronic supplemental O2, HFpEF, prior MI, HTN, hyperlipidemia, type 2 diabetes, history of cocaine abuse, CKD stage II. anemia and anxiety presents for 1 week follow up.   Her last echocardiogram 04/04/16 showed an ejection fraction of 60-65% with grade 2 diastolic dysfunction. Normal right ventricular cavity size and function.  Seen in clinic 6/11 by APP for weight gain. She had not good response to increase diuretics by PCP (TID lasix and metolazone). Advised to cut back on salt/fast foods. Started on Torsemide 54m BID for better absorption. Her weight was 302lb. Baseline weight of 279lb. BNP was normal. Scr 1.46.   Her today for follow up.  She continues to have dyspnea and abdominal tightness. Not much urine output. She continues to eat outside. No edema. He has not been checking her weight at home.  Past Medical History:  Diagnosis Date  . Anxiety   . Arthritis    "all over" (04/10/2016)  . Asthma   . CHF (congestive heart failure) ([place]   . Chronic bronchitis ([place]   . Chronic kidney disease    "I see a kidney dr." (04/10/2016)  . Cocaine abuse   . Complication of anesthesia    decreased bp, decreased heart rate  . Depression   . Disorder of nervous system   . Emphysema   . GERD (gastroesophageal reflux disease)   . Heart attack ([facility] 1980s  . History of blood transfusion 1994   "couldn't stop bleeding from my period"  . Hyperlipidemia LDL goal <70   . Hypertension   . Incontinence   . Manic depression ([place]   . On home oxygen therapy    "6L; 24/7" (04/10/2016)  . OSA on CPAP    "wear mask sometimes" (04/10/2016)  . Paranoid ([place]    "sometimes; I'm on RX for it" (04/10/2016)  .  Pneumonia    "I've had it several times; haven't had it since 06/2015" (04/10/2016)  . Schizophrenia ([place]   . Seasonal allergies   . Seizures ([facility]    "don't know what kind; last one was ~ 1 yr ago" (04/10/2016)  . Sinus trouble   . Stroke ([facility] 1980s   denies residual on 04/10/2016  . Type II diabetes mellitus ([facility]     Past Surgical History:  Procedure Laterality Date  . CESAREAN SECTION  1997  . HERNIA REPAIR    . IR PERC CHOLECYSTOSTOMY  05/10/2016  . IR RADIOLOGIST EVAL & MGMT  06/08/2016  . UMBILICAL HERNIA REPAIR  ~ 1963   "that's why I don't have a belly button"  . VAGINAL HYSTERECTOMY      Current Medications: Prior to Admission medications   Medication Sig Start Date End Date Taking? Authorizing Provider  albuterol (PROVENTIL) (2.5 MG/3ML) 0.083% nebulizer solution Take 3 mLs (2.5 mg total) by nebulization every 6 (six) hours as needed for wheezing or shortness of breath. 06/13/16   DArchie Patten MD  Alcohol Swabs (B-D SINGLE USE SWABS REGULAR) PADS CHECK CAPILLARY BLOOD GLUCOSE ONE TIME DAILY 02/03/16   DArchie Patten MD  amLODipine (NORVASC) 5 MG tablet Take 1 tablet (5 mg total) by mouth daily. 06/13/16  Archie Patten, MD  aspirin 81 MG EC tablet Take 1 tablet (81 mg total) by mouth daily. Swallow whole. 06/13/16   Archie Patten, MD  atorvastatin (LIPITOR) 40 MG tablet Take 1 tablet (40 mg total) by mouth daily. 06/13/16   Archie Patten, MD  benztropine (COGENTIN) 0.5 MG tablet Take 1 tablet (0.5 mg total) by mouth 2 (two) times daily. 06/19/16 06/19/17  Arfeen, Arlyce Harman, MD  Blood Glucose Monitoring Suppl (ACCU-CHEK AVIVA PLUS) w/Device KIT CHECK BLOOD SUGAR THREE TIMES DAILY 12/02/15   Archie Patten, MD  divalproex ([name] ER) 500 MG 24 hr tablet Take 2 tablets (1,000 mg total) by mouth at bedtime. 06/29/16 06/29/17  Arfeen, Arlyce Harman, MD  escitalopram (LEXAPRO) 20 MG tablet Take 1 tablet (20 mg total) by mouth daily. 06/29/16   Arfeen, Arlyce Harman, MD  fluticasone  furoate-vilanterol (BREO ELLIPTA) 100-25 MCG/INH AEPB INHALE 1 PUFF EVERY DAY 06/13/16   Archie Patten, MD  gabapentin (NEURONTIN) 600 MG tablet Take 1 tablet (600 mg total) by mouth 3 (three) times daily. 06/28/16   Archie Patten, MD  glucose blood test strip Use to check blood sugar 3 times daily. 12/05/15   Pucilowska, Herma Ard B, MD  metFORMIN (GLUCOPHAGE-XR) 500 MG 24 hr tablet Take 1 tablet (500 mg total) by mouth 2 (two) times daily. 06/28/16   Archie Patten, MD  mirtazapine (REMERON) 45 MG tablet Take 1 tablet (45 mg total) by mouth at bedtime. 06/29/16   Arfeen, Arlyce Harman, MD  montelukast (SINGULAIR) 10 MG tablet Take 1 tablet (10 mg total) by mouth at bedtime. 06/13/16   Archie Patten, MD  Multiple Vitamin (MULTIVITAMIN WITH MINERALS) TABS tablet Take 1 tablet by mouth daily.    [provider]  naproxen (NAPROXEN DR) 500 MG EC tablet Take 1 tablet (500 mg total) by mouth 2 (two) times daily as needed. 06/28/16   Archie Patten, MD  omeprazole (PRILOSEC) 40 MG capsule Take 1 capsule (40 mg total) by mouth 2 (two) times daily. 06/28/16   Archie Patten, MD  oxybutynin (DITROPAN-XL) 5 MG 24 hr tablet Take 5 mg by mouth at bedtime.    [provider]  OXYGEN Inhale 6 L into the lungs daily.    [provider]  potassium chloride SA (K-DUR,KLOR-CON) 20 MEQ tablet Take 2 tablets (40 mEq total) by mouth 2 (two) times daily. 06/28/16   Archie Patten, MD  tiotropium (SPIRIVA HANDIHALER) 18 MCG inhalation capsule Place 1 capsule (18 mcg total) into inhaler and inhale daily. 06/13/16   Archie Patten, MD  torsemide (DEMADEX) 20 MG tablet Take 3 tablets (60 mg total) by mouth 2 (two) times daily. 06/28/16 09/26/16  Archie Patten, MD  VENTOLIN HFA 108 (90 Base) MCG/ACT inhaler Inhale 2 puffs into the lungs every 6 (six) hours as needed. As needed for wheezing or sob 06/01/16   [provider]  ziprasidone (GEODON) 80 MG capsule Take 1 capsule (80 mg  total) by mouth at bedtime. 06/29/16   Arfeen, Arlyce Harman, MD    Allergies:   Hydroxyzine; Latuda [lurasidone hcl]; Codeine; and Sulfa antibiotics   Social History   Social History  . Marital status: Widowed    Spouse name: N/A  . Number of children: 3  . Years of education: N/A   Occupational History  . disabled     factory production   Social History Main Topics  . Smoking status: Former Smoker  Packs/day: 1.50    Years: 38.00    Types: Cigarettes    Start date: 03/13/1977    Quit date: 04/10/2016  . Smokeless tobacco: Never Used  . Alcohol use No  . Drug use: No     Comment: 04/10/2016 "last used cocaine back in November 2017"  . Sexual activity: No   Other Topics Concern  . None   Social History Narrative   Has 1 son, [name]   Lives with son and his boyfriend   Her house has ramps and handrails should she ever needs them.    Her mother lives down the street from her and is a good support person in addition to her son.   She drives herself, has private transportation.    Cocaine free since 02/24/16, smoke free since 04/10/16        Family History:  The patient's family history includes Anxiety disorder in her sister; Bipolar disorder in her sister; Cancer in her father and mother; Depression in her brother, mother, sister, and sister; Schizophrenia in her sister.   ROS:   Please see the history of present illness.    ROS All other systems reviewed and are negative.   PHYSICAL EXAM:   VS:  BP 108/70   Pulse 86   Ht '5\' 4"'  (1.626 m)   Wt 299 lb (135.6 kg)   BMI 51.32 kg/m    GEN: Well nourished, well developed, in no acute distress  HEENT: normal  Neck: no JVD, carotid bruits, or masses Cardiac: RRR; no murmurs, rubs, or gallops,no edema  Respiratory:  clear to auscultation bilaterally, normal work of breathing GI: soft, nontender, + distended, + BS MS: no deformity or atrophy  Skin: warm and dry, no rash Neuro:  Alert and Oriented x 3, Strength and sensation  are intact Psych: euthymic mood, full affect  Wt Readings from Last 3 Encounters:  07/04/16 299 lb (135.6 kg)  06/28/16 299 lb 12.8 oz (136 kg)  06/28/16 (!) 303 lb (137.4 kg)      Studies/Labs Reviewed:   EKG:  EKG is not ordered today.    Recent Labs: 03/15/2016: TSH 0.563 04/23/2016: Magnesium 2.4 06/02/2016: BNP 22.3 06/20/2016: ALT 11; Hemoglobin WILL FOLLOW; Platelets WILL FOLLOW 06/26/2016: BUN 23; Creatinine, Ser 1.46; NT-Pro BNP 57; Potassium 4.3; Sodium 143   Lipid Panel    Component Value Date/Time   CHOL 135 12/04/2015 0635   TRIG 90 12/04/2015 0635   HDL 55 12/04/2015 0635   CHOLHDL 2.5 12/04/2015 0635   VLDL 18 12/04/2015 0635   LDLCALC 62 12/04/2015 0635   LDLDIRECT 52 04/05/2012 1210    Additional studies/ records that were reviewed today include:   Echocardiogram: 04/04/16 Study Conclusions  - Left ventricle: Global longitudinal LV strain is normal at -19%   The cavity size was mildly dilated. There was moderate concentric   hypertrophy. Systolic function was normal. The estimated ejection   fraction was in the range of 60% to 65%. Wall motion was normal;   there were no regional wall motion abnormalities. Features are   consistent with a pseudonormal left ventricular filling pattern,   with concomitant abnormal relaxation and increased filling   pressure (grade 2 diastolic dysfunction). - Left atrium: The atrium was mildly dilated. - Pulmonic valve: There was trivial regurgitation. - Pulmonary arteries: Systolic pressure could not be accurately   estimated.   ASSESSMENT & PLAN:    1. Acute on hronic diastolic heart failure - BNP was normal during last  office visit. No significant improvement on symptoms on torsemide. However she lost 3 pounds based on weight since last office visit (302-->299lb). Baseline weight of 279 LB. Most of  fluid in her belly. Will increase torsemide to 80 mg twice a day as suggested during last visit. Increase supplement  potassium to 50 g twice a day. Check CMP today to rule out hepatic congestion.  2. COPD/asthma on home oxygen chronically - Stable dyspnea  3. Hyperlipidemia - Continue statin. Followed by PCP.  4. Hypertension - Control on current regimen  5. Morbid obesity - Estimated body mass index is 51.32 kg/m as calculated from the following:   Height as of this encounter: '5\' 4"'  (1.626 m).   Weight as of this encounter: 299 lb (135.6 kg).  - Main issue is obesity. She needs to exercise daily and eat healthy. Seems not interested lifestyle modification.  Medication Adjustments/Labs and Tests Ordered: Current medicines are reviewed at length with the patient today.  Concerns regarding medicines are outlined above.  Medication changes, Labs and Tests ordered today are listed in the Patient Instructions below. Patient Instructions  Medication Instructions:  Your physician has recommended you make the following change in your medication:  1-INCREASE Torsemide 80 mg (4 tablets) by mouth twice daily 2-INCREASE Potassium 50 mg (2 1/2 tablets) by mouth twice daily   Labwork: Your physician recommends that you have lab work today- CMET  Testing/Procedures: NONE  Follow-Up: Your physician wants you to follow-up with a PA or NP on Dr. Kingsley Plan team in a week .   If you need a refill on your cardiac medications before your next appointment, please call your pharmacy.       Jarrett Soho, [place]  07/04/2016 2:34 PM    [facility] [address], [place], [place]  [address] Phone: [phone]; Fax: [phone]

## 2016-07-04 ENCOUNTER — Telehealth: Payer: Self-pay | Admitting: Family Medicine

## 2016-07-04 ENCOUNTER — Encounter: Payer: Self-pay | Admitting: Physician Assistant

## 2016-07-04 ENCOUNTER — Other Ambulatory Visit: Payer: Medicare HMO | Admitting: *Deleted

## 2016-07-04 ENCOUNTER — Ambulatory Visit (INDEPENDENT_AMBULATORY_CARE_PROVIDER_SITE_OTHER): Payer: Medicare HMO | Admitting: Physician Assistant

## 2016-07-04 ENCOUNTER — Other Ambulatory Visit: Payer: Self-pay | Admitting: Pharmacist

## 2016-07-04 VITALS — BP 108/70 | HR 86 | Ht 64.0 in | Wt 299.0 lb

## 2016-07-04 DIAGNOSIS — I5033 Acute on chronic diastolic (congestive) heart failure: Secondary | ICD-10-CM

## 2016-07-04 DIAGNOSIS — I10 Essential (primary) hypertension: Secondary | ICD-10-CM | POA: Diagnosis not present

## 2016-07-04 DIAGNOSIS — E784 Other hyperlipidemia: Secondary | ICD-10-CM

## 2016-07-04 DIAGNOSIS — E7849 Other hyperlipidemia: Secondary | ICD-10-CM

## 2016-07-04 DIAGNOSIS — R140 Abdominal distension (gaseous): Secondary | ICD-10-CM

## 2016-07-04 MED ORDER — POTASSIUM CHLORIDE CRYS ER 20 MEQ PO TBCR: 50.0000 meq | EXTENDED_RELEASE_TABLET | Freq: Two times a day (BID) | ORAL | 2 refills | Status: DC

## 2016-07-04 MED ORDER — TORSEMIDE 20 MG PO TABS: 80.0000 mg | ORAL_TABLET | Freq: Two times a day (BID) | ORAL | 3 refills | Status: DC

## 2016-07-04 NOTE — Telephone Encounter (Signed)
Tiffany [name] would like verbal orders for PT twice a week for two weeks. ep

## 2016-07-04 NOTE — Patient Outreach (Signed)
[facility]) Care Management  07/04/2016  Tiffany [name] 11-Mar-1960 [id number]  Called patient to follow up on pill packs.  HIPAA identifiers were obtained.  Patient confirmed the packs were delivered to her home but she was at a [place] appointment when I called and could not assess her understanding of the packs at that time.    Patient was instructed to review the packing system when she gets home and call me with any questions or concerns.   Plan:  I will follow up with the patient next week to see how the packs are doing.   Elayne Guerin, PharmD, [place] Clinical Pharmacist [phone]

## 2016-07-04 NOTE — Patient Instructions (Addendum)
Medication Instructions:  Your physician has recommended you make the following change in your medication:  1-INCREASE Torsemide 80 mg (4 tablets) by mouth twice daily 2-INCREASE Potassium 50 mg (2 1/2 tablets) by mouth twice daily   Labwork: Your physician recommends that you have lab work today- CMET  Testing/Procedures: NONE  Follow-Up: Your physician wants you to follow-up with a PA or NP on Dr. Kingsley Plan team in a week .   If you need a refill on your cardiac medications before your next appointment, please call your pharmacy.

## 2016-07-05 ENCOUNTER — Ambulatory Visit (INDEPENDENT_AMBULATORY_CARE_PROVIDER_SITE_OTHER): Payer: Medicare HMO | Admitting: Family Medicine

## 2016-07-05 ENCOUNTER — Encounter: Payer: Self-pay | Admitting: Family Medicine

## 2016-07-05 VITALS — BP 120/80 | HR 84 | Temp 98.5°F | Ht 64.0 in | Wt 298.0 lb

## 2016-07-05 DIAGNOSIS — J9621 Acute and chronic respiratory failure with hypoxia: Secondary | ICD-10-CM | POA: Diagnosis not present

## 2016-07-05 DIAGNOSIS — G894 Chronic pain syndrome: Secondary | ICD-10-CM | POA: Diagnosis not present

## 2016-07-05 DIAGNOSIS — E118 Type 2 diabetes mellitus with unspecified complications: Secondary | ICD-10-CM

## 2016-07-05 DIAGNOSIS — J9622 Acute and chronic respiratory failure with hypercapnia: Secondary | ICD-10-CM | POA: Diagnosis not present

## 2016-07-05 DIAGNOSIS — Z434 Encounter for attention to other artificial openings of digestive tract: Secondary | ICD-10-CM | POA: Diagnosis not present

## 2016-07-05 LAB — COMPREHENSIVE METABOLIC PANEL
ALT: 50 IU/L — ABNORMAL HIGH (ref 0–32)
AST: 43 IU/L — ABNORMAL HIGH (ref 0–40)
Albumin/Globulin Ratio: 1.3 (ref 1.2–2.2)
Albumin: 3.9 g/dL (ref 3.5–5.5)
Alkaline Phosphatase: 180 IU/L — ABNORMAL HIGH (ref 39–117)
BUN/Creatinine Ratio: 15 (ref 9–23)
BUN: 19 mg/dL (ref 6–24)
Bilirubin Total: <0.2 mg/dL (ref 0.0–1.2)
CO2: 33 mmol/L — ABNORMAL HIGH (ref 20–29)
Calcium: 9.7 mg/dL (ref 8.7–10.2)
Chloride: 96 mmol/L (ref 96–106)
Creatinine, Ser: 1.28 mg/dL — ABNORMAL HIGH (ref 0.57–1.00)
GFR calc Af Amer: 54 mL/min/{1.73_m2} — ABNORMAL LOW (ref >59)
GFR calc non Af Amer: 47 mL/min/{1.73_m2} — ABNORMAL LOW (ref >59)
Globulin, Total: 3.1 g/dL (ref 1.5–4.5)
Glucose: 91 mg/dL (ref 65–99)
Potassium: 4.3 mmol/L (ref 3.5–5.2)
Sodium: 145 mmol/L — ABNORMAL HIGH (ref 134–144)
Total Protein: 7 g/dL (ref 6.0–8.5)

## 2016-07-05 LAB — POCT GLYCOSYLATED HEMOGLOBIN (HGB A1C): Hemoglobin A1C: 5.6

## 2016-07-05 NOTE — Telephone Encounter (Signed)
Called and gave orders.  Archie Patten, MD [facility] Family Medicine Resident  07/05/2016, 8:22 AM

## 2016-07-05 NOTE — Patient Instructions (Signed)
It was good to see you. Continue with the increased dose of torsemide, 4 tablets (80 mg) twice daily Continue with the increased amount of potassium. If you note that your breathing is worsening instead of improving, please call me or the cardiologist so that we can change her regimen. Some shortness of breath may be due to deconditioning, it has been a while since she been able to exert yourself. We need to increase your exercise slowly. Continue with physical therapy.

## 2016-07-05 NOTE — Progress Notes (Signed)
Subjective: CC: f/u breathing HPI: Patient is a 56 y.o. female with a past medical history of CKD 2, diastolic heart failure, COPD, type 2 diabetes, bipolar disorder presenting to clinic today for a f/u on breathing.  Breathing: Feels she started developing some DOE last night.  Walking from waiting room to exam room she was SOB. Walking from bedroom to kitchen makes her SOB. States at baseline, this is sometimes good, sometimes bad. She saw cardiology yesterday and was advised to increase torsemide to 54m BID and increase potassium to  50 g twice a day.  O2 ranging around: 87-98% Still on 6 L nasal cannula, baseline for her.  Can't lay flat due to SOB, this is stable.  Denies lower extremity edema. Denies any wheezing. No chest pain. No cough, fevers, chills. No upper respiratory symptoms. Stopped weighing self: states she needs to get a new scale because she didn't think it was right.   [name] now taking Naproxen twice daily which makes her very happy. She notes she has diffuse joint pain, no area predominates. [name] unable to function with only taking naproxen once daily and Tylenol scheduled. She didn't feel the tramadol helped. Given her history of substance abuse, she is not a great candidate for opioids. Palliative care has been consulted, hopefully they can assist    Social History: still smoke free!   ROS: All other systems reviewed and are negative.  Past Medical History Patient Active Problem List   Diagnosis Date Noted  . Overactive bladder 06/07/2016  . Gallstones   . Upper abdominal pain   . Cholecystitis 05/09/2016  . QT prolongation   . OSA and COPD overlap syndrome ([place]   . Generalized weakness   . Arthritis   . Bipolar I disorder (HC[place]   . Hypokalemia 04/21/2016  . Wheezing 04/12/2016  . (HFpEF) heart failure with preserved ejection fraction ([place]   . Benign essential HTN 03/22/2016  . Rectal bleeding 12/31/2015  . Bipolar affective disorder,  mixed, severe, with psychotic behavior (HL[place] 11/28/2015  . Cocaine use disorder, severe, dependence ([place] 11/28/2015  . Hyponatremia 11/25/2015  . AKI (acute kidney injury) ([place] 11/24/2015  . Blood in sputum   . Chronic diastolic heart failure (HH[place]   . SOB (shortness of breath)   . Acute on chronic respiratory failure with hypoxia and hypercapnia (HM[place] 06/22/2015  . Onychomycosis 01/21/2015  . Nasal congestion 12/17/2014  . Tobacco use disorder 07/22/2014  . COPD (chronic obstructive pulmonary disease) ([place] 07/08/2014  . Seborrheic keratoses 12/31/2013  . Knee pain, bilateral 01/22/2013  . Seizure ([place] 01/04/2013  . Health care maintenance 11/25/2012  . Folliculitis 195/62/1308 . History of kidney stones 06/18/2012  . Chronic pain syndrome 06/18/2012  . Muscle spasm 06/18/2012  . Elevated troponin 04/28/2012  . Allergic reaction 04/07/2012  . Itching 09/06/2011  . HSV infection 08/30/2011  . Dyslipidemia 04/24/2011  . Anemia 04/24/2011  . Leukocytosis 04/24/2011  . Diabetic neuropathy ([place] 04/24/2011  . Obstructive sleep apnea 10/18/2010  . Asthma 10/18/2010  . Morbid obesity (HL[place] 10/18/2010  . Type 2 diabetes mellitus (HL[place] 10/18/2010    Medications- reviewed and updated Current Outpatient Prescriptions  Medication Sig Dispense Refill  . albuterol (PROVENTIL) (2.5 MG/3ML) 0.083% nebulizer solution Take 3 mLs (2.5 mg total) by nebulization every 6 (six) hours as needed for wheezing or shortness of breath. 150 mL 1  . Alcohol Swabs (B-D SINGLE USE SWABS REGULAR) PADS CHECK CAPILLARY BLOOD GLUCOSE ONE TIME DAILY 100  each 3  . amLODipine (NORVASC) 5 MG tablet Take 1 tablet (5 mg total) by mouth daily. 90 tablet 0  . aspirin 81 MG EC tablet Take 1 tablet (81 mg total) by mouth daily. Swallow whole. 90 tablet 0  . atorvastatin (LIPITOR) 40 MG tablet Take 1 tablet (40 mg total) by mouth daily. 90 tablet 0  . benztropine (COGENTIN) 0.5 MG tablet Take 1 tablet (0.5 mg total)  by mouth 2 (two) times daily. 60 tablet 0  . Blood Glucose Monitoring Suppl (ACCU-CHEK AVIVA PLUS) w/Device KIT CHECK BLOOD SUGAR THREE TIMES DAILY 1 kit 0  . divalproex ([name] ER) 500 MG 24 hr tablet Take 2 tablets (1,000 mg total) by mouth at bedtime. 60 tablet 1  . escitalopram (LEXAPRO) 20 MG tablet Take 1 tablet (20 mg total) by mouth daily. 30 tablet 1  . fluticasone furoate-vilanterol (BREO ELLIPTA) 100-25 MCG/INH AEPB INHALE 1 PUFF EVERY DAY 180 each 0  . gabapentin (NEURONTIN) 600 MG tablet Take 1 tablet (600 mg total) by mouth 3 (three) times daily. 270 tablet 0  . glucose blood test strip Use to check blood sugar 3 times daily. 100 each 12  . metFORMIN (GLUCOPHAGE-XR) 500 MG 24 hr tablet Take 1 tablet (500 mg total) by mouth 2 (two) times daily. 180 tablet 0  . mirtazapine (REMERON) 45 MG tablet Take 1 tablet (45 mg total) by mouth at bedtime. 30 tablet 1  . montelukast (SINGULAIR) 10 MG tablet Take 1 tablet (10 mg total) by mouth at bedtime. 90 tablet 1  . Multiple Vitamin (MULTIVITAMIN WITH MINERALS) TABS tablet Take 1 tablet by mouth daily.    . naproxen (NAPROXEN DR) 500 MG EC tablet Take 500 mg by mouth 2 (two) times daily as needed (pain).    [name] omeprazole (PRILOSEC) 40 MG capsule Take 1 capsule (40 mg total) by mouth 2 (two) times daily. 180 capsule 0  . oxybutynin (DITROPAN-XL) 5 MG 24 hr tablet Take 5 mg by mouth at bedtime.    . OXYGEN Inhale 6 L into the lungs daily.    . potassium chloride SA (K-DUR,KLOR-CON) 20 MEQ tablet Take 2.5 tablets (50 mEq total) by mouth 2 (two) times daily. 150 tablet 2  . tiotropium (SPIRIVA HANDIHALER) 18 MCG inhalation capsule Place 1 capsule (18 mcg total) into inhaler and inhale daily. 90 capsule 1  . torsemide (DEMADEX) 20 MG tablet Take 4 tablets (80 mg total) by mouth 2 (two) times daily. 120 tablet 3  . VENTOLIN HFA 108 (90 Base) MCG/ACT inhaler Inhale 2 puffs into the lungs every 6 (six) hours as needed. As needed for wheezing or sob      . ziprasidone (GEODON) 80 MG capsule Take 1 capsule (80 mg total) by mouth at bedtime. 30 capsule 1   No current facility-administered medications for this visit.     Objective: Office vital signs reviewed. BP 120/80   Pulse 84   Temp 98.5 F (36.9 C) (Oral)   Ht '5\' 4"'  (1.626 m)   Wt 298 lb (135.2 kg)   SpO2 92% Comment: on 6 liters  BMI 51.15 kg/m  06/20/16: 299 06/28/16: 299lb   Physical Examination:  General: Awake, alert, well- nourished, NAD Cardio: RRR, no m/r/g noted. Trace pitting edema. Difficult to assess JVD but none noted.  Pulm: No increased WOB.  CTAB, without wheezes, rhonchi or crackles noted. On 6L [place].  MSK: normal to inspection with no tremor. Good hand grip/symmetric. Normal DRTs. No clonus. No spasticity  noted.  Abdomen: Cholecystostomy tube in place with a small amount of granulation tissue around the insertion site without erythema. Small amount of brown drainage noted on the gauze. Draining bilious fluid in the bag   Assessment/Plan:   SOB: Symptoms have improved and weight is stable. Continue torsemide 60 mg twice a day. Continue daily weights. K looked stable from 2 days ago, continue KDUR 45mQ BID for now. If she notes no improvement in weight, pt to call cardiology as instructed at their visit, may need torsemide 885mBID.  Arthritis: Diffuse with no predominance. Patient states feeling that helps is naproxen.  Her renal function does not tolerate this well.  In the past we stated it was okay to try naproxen when her renal function had normalized, but as of her cardiology appointment 2 days ago, it is worsened. The patient is aware of the risk and benefits and wants to continue to use the naproxen for now. No benefit from tramadol in the past. Given her history of cocaine abuse in the past, I would prefer to avoid opioids. -Patient has a consult in for palliative care, hopefully they can assist in this issue.  The patient has a follow-up with cardiology on  Tuesday of next week, she does not feel comfortable going more than 1 week without seeing me so she will follow-up at the end of next week.  Orders Placed This Encounter  Procedures  . POCT glycosylated hemoglobin (Hb A1C)     CrArchie PattenGY-3, CoKaycee

## 2016-07-06 DIAGNOSIS — I5032 Chronic diastolic (congestive) heart failure: Secondary | ICD-10-CM | POA: Diagnosis not present

## 2016-07-06 DIAGNOSIS — F1420 Cocaine dependence, uncomplicated: Secondary | ICD-10-CM | POA: Diagnosis not present

## 2016-07-06 DIAGNOSIS — J449 Chronic obstructive pulmonary disease, unspecified: Secondary | ICD-10-CM | POA: Diagnosis not present

## 2016-07-06 DIAGNOSIS — Z4682 Encounter for fitting and adjustment of non-vascular catheter: Secondary | ICD-10-CM | POA: Diagnosis not present

## 2016-07-06 DIAGNOSIS — F3164 Bipolar disorder, current episode mixed, severe, with psychotic features: Secondary | ICD-10-CM | POA: Diagnosis not present

## 2016-07-06 DIAGNOSIS — N189 Chronic kidney disease, unspecified: Secondary | ICD-10-CM | POA: Diagnosis not present

## 2016-07-06 DIAGNOSIS — I130 Hypertensive heart and chronic kidney disease with heart failure and stage 1 through stage 4 chronic kidney disease, or unspecified chronic kidney disease: Secondary | ICD-10-CM | POA: Diagnosis not present

## 2016-07-06 DIAGNOSIS — K819 Cholecystitis, unspecified: Secondary | ICD-10-CM | POA: Diagnosis not present

## 2016-07-06 DIAGNOSIS — E119 Type 2 diabetes mellitus without complications: Secondary | ICD-10-CM | POA: Diagnosis not present

## 2016-07-06 NOTE — Assessment & Plan Note (Signed)
Not a great candidate for opiates given no end in sight. Additionally, she has a h/o cocaine abuse within the last year. Naproxen seems to do the trick, however her renal function is up and down. Hopefully palliative care can assist in this matter. Contacted Donita with [facility] to see what the status of the referral is, she will contact their office as they should've already reached out to the pt.

## 2016-07-06 NOTE — Assessment & Plan Note (Signed)
Seems to be stable, mostly during exertion. As weight has been almost stagnant over the last 2 weeks, I'm curious how much is attributable to the heart vs lungs vs severe deconditioning. Will continue current course with torsemide and potassium. Patient to continue all inhalers. She will get 2 more weeks of PT.  I am curious if she would benefit from and be a candidate for cardiopulmonary rehab? Patient advised to call my office or cardiology if she has any worsening symptoms or infectious symptoms. She has f/u with pulmonology next week, maybe they can shed some light. She will f/u with me next week or sooner as needed. [name] aware next week is my last week and that I have discussed her case with her new PCP in detail.

## 2016-07-07 ENCOUNTER — Other Ambulatory Visit: Payer: Self-pay | Admitting: Pharmacist

## 2016-07-07 NOTE — Patient Outreach (Signed)
[facility]) Care Management  07/07/2016  Tiffany [name] 22-Jan-1960 [id number]   Patient was called to follow up on pill packing. HIPAA identifiers were obtained.  Patient confirmed she received her pill packs from [facility] and reported understanding how to use the system.  Patient also said some of her medications are still in bottles (as was expected) and she said some of her medications from Dr. [name] were not in the pack.  Patient was driving at the time of my call so she could not tell me which medications from Dr. [name] were missing.  A home visit was scheduled for early next week to investigate the pill packs.  Plan:  Conduct home visit as scheduled with the patient today 07/11/16 at Manuel Garcia, PharmD, [place] Clinical Pharmacist [phone]

## 2016-07-09 ENCOUNTER — Encounter (HOSPITAL_COMMUNITY): Payer: Self-pay

## 2016-07-09 ENCOUNTER — Emergency Department (HOSPITAL_COMMUNITY): Payer: Medicare HMO

## 2016-07-09 ENCOUNTER — Inpatient Hospital Stay (HOSPITAL_COMMUNITY)
Admission: EM | Admit: 2016-07-09 | Discharge: 2016-07-20 | DRG: 492 | Disposition: A | Discharge status: Home / Self Care | Payer: Medicare HMO | Attending: Family Medicine | Admitting: Family Medicine

## 2016-07-09 DIAGNOSIS — J9622 Acute and chronic respiratory failure with hypercapnia: Secondary | ICD-10-CM | POA: Diagnosis not present

## 2016-07-09 DIAGNOSIS — Z9889 Other specified postprocedural states: Secondary | ICD-10-CM | POA: Diagnosis not present

## 2016-07-09 DIAGNOSIS — F200 Paranoid schizophrenia: Secondary | ICD-10-CM | POA: Diagnosis present

## 2016-07-09 DIAGNOSIS — S82401A Unspecified fracture of shaft of right fibula, initial encounter for closed fracture: Secondary | ICD-10-CM | POA: Diagnosis not present

## 2016-07-09 DIAGNOSIS — T148XXA Other injury of unspecified body region, initial encounter: Secondary | ICD-10-CM | POA: Diagnosis not present

## 2016-07-09 DIAGNOSIS — D62 Acute posthemorrhagic anemia: Secondary | ICD-10-CM | POA: Diagnosis not present

## 2016-07-09 DIAGNOSIS — S82201A Unspecified fracture of shaft of right tibia, initial encounter for closed fracture: Secondary | ICD-10-CM | POA: Diagnosis not present

## 2016-07-09 DIAGNOSIS — N182 Chronic kidney disease, stage 2 (mild): Secondary | ICD-10-CM | POA: Diagnosis present

## 2016-07-09 DIAGNOSIS — E876 Hypokalemia: Secondary | ICD-10-CM | POA: Diagnosis not present

## 2016-07-09 DIAGNOSIS — Z79899 Other long term (current) drug therapy: Secondary | ICD-10-CM

## 2016-07-09 DIAGNOSIS — Z4789 Encounter for other orthopedic aftercare: Secondary | ICD-10-CM | POA: Diagnosis not present

## 2016-07-09 DIAGNOSIS — W19XXXA Unspecified fall, initial encounter: Secondary | ICD-10-CM

## 2016-07-09 DIAGNOSIS — E1122 Type 2 diabetes mellitus with diabetic chronic kidney disease: Secondary | ICD-10-CM | POA: Diagnosis present

## 2016-07-09 DIAGNOSIS — E785 Hyperlipidemia, unspecified: Secondary | ICD-10-CM | POA: Diagnosis present

## 2016-07-09 DIAGNOSIS — E1140 Type 2 diabetes mellitus with diabetic neuropathy, unspecified: Secondary | ICD-10-CM | POA: Diagnosis present

## 2016-07-09 DIAGNOSIS — J9611 Chronic respiratory failure with hypoxia: Secondary | ICD-10-CM | POA: Diagnosis not present

## 2016-07-09 DIAGNOSIS — S82221A Displaced transverse fracture of shaft of right tibia, initial encounter for closed fracture: Secondary | ICD-10-CM | POA: Diagnosis not present

## 2016-07-09 DIAGNOSIS — R0600 Dyspnea, unspecified: Secondary | ICD-10-CM | POA: Diagnosis not present

## 2016-07-09 DIAGNOSIS — Z01818 Encounter for other preprocedural examination: Secondary | ICD-10-CM | POA: Diagnosis not present

## 2016-07-09 DIAGNOSIS — I517 Cardiomegaly: Secondary | ICD-10-CM | POA: Diagnosis not present

## 2016-07-09 DIAGNOSIS — Z87891 Personal history of nicotine dependence: Secondary | ICD-10-CM

## 2016-07-09 DIAGNOSIS — S82831A Other fracture of upper and lower end of right fibula, initial encounter for closed fracture: Secondary | ICD-10-CM

## 2016-07-09 DIAGNOSIS — I130 Hypertensive heart and chronic kidney disease with heart failure and stage 1 through stage 4 chronic kidney disease, or unspecified chronic kidney disease: Secondary | ICD-10-CM | POA: Diagnosis present

## 2016-07-09 DIAGNOSIS — E662 Morbid (severe) obesity with alveolar hypoventilation: Secondary | ICD-10-CM | POA: Diagnosis present

## 2016-07-09 DIAGNOSIS — I5032 Chronic diastolic (congestive) heart failure: Secondary | ICD-10-CM | POA: Diagnosis not present

## 2016-07-09 DIAGNOSIS — J9621 Acute and chronic respiratory failure with hypoxia: Secondary | ICD-10-CM | POA: Diagnosis not present

## 2016-07-09 DIAGNOSIS — J9612 Chronic respiratory failure with hypercapnia: Secondary | ICD-10-CM | POA: Diagnosis not present

## 2016-07-09 DIAGNOSIS — Z7984 Long term (current) use of oral hypoglycemic drugs: Secondary | ICD-10-CM | POA: Diagnosis not present

## 2016-07-09 DIAGNOSIS — S82221D Displaced transverse fracture of shaft of right tibia, subsequent encounter for closed fracture with routine healing: Secondary | ICD-10-CM | POA: Diagnosis not present

## 2016-07-09 DIAGNOSIS — I509 Heart failure, unspecified: Secondary | ICD-10-CM | POA: Diagnosis not present

## 2016-07-09 DIAGNOSIS — I5033 Acute on chronic diastolic (congestive) heart failure: Secondary | ICD-10-CM

## 2016-07-09 DIAGNOSIS — R32 Unspecified urinary incontinence: Secondary | ICD-10-CM | POA: Diagnosis not present

## 2016-07-09 DIAGNOSIS — Z419 Encounter for procedure for purposes other than remedying health state, unspecified: Secondary | ICD-10-CM

## 2016-07-09 DIAGNOSIS — S72009A Fracture of unspecified part of neck of unspecified femur, initial encounter for closed fracture: Secondary | ICD-10-CM | POA: Diagnosis not present

## 2016-07-09 DIAGNOSIS — I10 Essential (primary) hypertension: Secondary | ICD-10-CM | POA: Diagnosis not present

## 2016-07-09 DIAGNOSIS — G4733 Obstructive sleep apnea (adult) (pediatric): Secondary | ICD-10-CM | POA: Diagnosis present

## 2016-07-09 DIAGNOSIS — F418 Other specified anxiety disorders: Secondary | ICD-10-CM | POA: Diagnosis not present

## 2016-07-09 DIAGNOSIS — R2681 Unsteadiness on feet: Secondary | ICD-10-CM | POA: Diagnosis not present

## 2016-07-09 DIAGNOSIS — J439 Emphysema, unspecified: Secondary | ICD-10-CM | POA: Diagnosis present

## 2016-07-09 DIAGNOSIS — F3164 Bipolar disorder, current episode mixed, severe, with psychotic features: Secondary | ICD-10-CM | POA: Diagnosis not present

## 2016-07-09 DIAGNOSIS — S82221S Displaced transverse fracture of shaft of right tibia, sequela: Secondary | ICD-10-CM | POA: Diagnosis not present

## 2016-07-09 DIAGNOSIS — Z6841 Body Mass Index (BMI) 40.0 and over, adult: Secondary | ICD-10-CM | POA: Diagnosis not present

## 2016-07-09 DIAGNOSIS — W1830XA Fall on same level, unspecified, initial encounter: Secondary | ICD-10-CM | POA: Diagnosis present

## 2016-07-09 DIAGNOSIS — R278 Other lack of coordination: Secondary | ICD-10-CM | POA: Diagnosis not present

## 2016-07-09 DIAGNOSIS — T85520A Displacement of bile duct prosthesis, initial encounter: Secondary | ICD-10-CM

## 2016-07-09 DIAGNOSIS — Z434 Encounter for attention to other artificial openings of digestive tract: Secondary | ICD-10-CM | POA: Diagnosis not present

## 2016-07-09 DIAGNOSIS — Z7982 Long term (current) use of aspirin: Secondary | ICD-10-CM | POA: Diagnosis not present

## 2016-07-09 DIAGNOSIS — M6281 Muscle weakness (generalized): Secondary | ICD-10-CM | POA: Diagnosis not present

## 2016-07-09 DIAGNOSIS — S82831S Other fracture of upper and lower end of right fibula, sequela: Secondary | ICD-10-CM | POA: Diagnosis not present

## 2016-07-09 DIAGNOSIS — R52 Pain, unspecified: Secondary | ICD-10-CM | POA: Diagnosis not present

## 2016-07-09 DIAGNOSIS — J449 Chronic obstructive pulmonary disease, unspecified: Secondary | ICD-10-CM

## 2016-07-09 DIAGNOSIS — S3993XA Unspecified injury of pelvis, initial encounter: Secondary | ICD-10-CM | POA: Diagnosis not present

## 2016-07-09 DIAGNOSIS — S86191A Other injury of other muscle(s) and tendon(s) of posterior muscle group at lower leg level, right leg, initial encounter: Secondary | ICD-10-CM | POA: Diagnosis not present

## 2016-07-09 DIAGNOSIS — M79604 Pain in right leg: Secondary | ICD-10-CM | POA: Diagnosis not present

## 2016-07-09 DIAGNOSIS — S99911A Unspecified injury of right ankle, initial encounter: Secondary | ICD-10-CM | POA: Diagnosis not present

## 2016-07-09 DIAGNOSIS — N179 Acute kidney failure, unspecified: Secondary | ICD-10-CM | POA: Diagnosis not present

## 2016-07-09 DIAGNOSIS — R509 Fever, unspecified: Secondary | ICD-10-CM | POA: Diagnosis not present

## 2016-07-09 DIAGNOSIS — T85520D Displacement of bile duct prosthesis, subsequent encounter: Secondary | ICD-10-CM

## 2016-07-09 DIAGNOSIS — R0602 Shortness of breath: Secondary | ICD-10-CM | POA: Diagnosis not present

## 2016-07-09 DIAGNOSIS — Z9181 History of falling: Secondary | ICD-10-CM | POA: Diagnosis not present

## 2016-07-09 LAB — CBC
HCT: 35 % — ABNORMAL LOW (ref 36.0–46.0)
Hemoglobin: 9.8 g/dL — ABNORMAL LOW (ref 12.0–15.0)
MCH: 28.3 pg (ref 26.0–34.0)
MCHC: 28 g/dL — ABNORMAL LOW (ref 30.0–36.0)
MCV: 101.2 fL — ABNORMAL HIGH (ref 78.0–100.0)
Platelets: 321 10*3/uL (ref 150–400)
RBC: 3.46 MIL/uL — ABNORMAL LOW (ref 3.87–5.11)
RDW: 16 % — ABNORMAL HIGH (ref 11.5–15.5)
WBC: 7.1 10*3/uL (ref 4.0–10.5)

## 2016-07-09 LAB — BASIC METABOLIC PANEL
Anion gap: 7 (ref 5–15)
BUN: 15 mg/dL (ref 6–20)
CO2: 31 mmol/L (ref 22–32)
Calcium: 8.6 mg/dL — ABNORMAL LOW (ref 8.9–10.3)
Chloride: 103 mmol/L (ref 101–111)
Creatinine, Ser: 1.4 mg/dL — ABNORMAL HIGH (ref 0.44–1.00)
GFR calc Af Amer: 47 mL/min — ABNORMAL LOW (ref >60)
GFR calc non Af Amer: 41 mL/min — ABNORMAL LOW (ref >60)
Glucose, Bld: 91 mg/dL (ref 65–99)
Potassium: 5.1 mmol/L (ref 3.5–5.1)
Sodium: 141 mmol/L (ref 135–145)

## 2016-07-09 MED ORDER — FENTANYL CITRATE (PF) 100 MCG/2ML IJ SOLN
100.0000 ug | Freq: Once | INTRAMUSCULAR | Status: AC
  Administered 2016-07-09: 100 ug via INTRAVENOUS
  Filled 2016-07-09: qty 2

## 2016-07-09 MED ORDER — IPRATROPIUM-ALBUTEROL 0.5-2.5 (3) MG/3ML IN SOLN
3.0000 mL | Freq: Once | RESPIRATORY_TRACT | Status: AC
  Administered 2016-07-09: 3 mL via RESPIRATORY_TRACT
  Filled 2016-07-09: qty 3

## 2016-07-09 MED ORDER — FENTANYL CITRATE (PF) 100 MCG/2ML IJ SOLN
100.0000 ug | Freq: Once | INTRAMUSCULAR | Status: DC
Start: 2016-07-09 — End: 2016-07-09

## 2016-07-09 MED ORDER — KETOROLAC TROMETHAMINE 30 MG/ML IJ SOLN
30.0000 mg | Freq: Once | INTRAMUSCULAR | Status: AC
  Administered 2016-07-10: 30 mg via INTRAVENOUS
  Filled 2016-07-09: qty 1

## 2016-07-09 NOTE — ED Triage Notes (Signed)
Pt with hx of multiple falls from home via EMS after a fall that occurred this evening. Per EMS, pt was in the bathroom when "her leg gave out" and she fell between the shower and the toilet. Pt reports head injury without LOC. Per EMS, pt with obvious deformity to R tibia. Pt reports head pain, no obvious hematoma noted. Pt given 100mg  fentanyl en route. Denies any other injury, N/V, neck/back pain. Pulses, sensation, and mobility intact. Pt on 6L oxygen at home, O2 sat decreased to 87% after administration of fentanyl, placed on nonbreather O2 sat at 97% at this time.

## 2016-07-09 NOTE — ED Notes (Signed)
Pt placed on 6L [place] to assess O2 sat after breathing treatment. Pt O2 sat remained at 98-100%. Pt breathing less labored and more even. Dorothea Ogle, [place] aware.

## 2016-07-09 NOTE — ED Notes (Signed)
Pt began to desat to 89% on 6L [place] after fentanyl administration. This nurse placed nonrebreather on pt, O2 sat increased to 96%. While attempting to re assess pt pain, pt alert and awake but not responding to this nurse. Upon sternal rubbing, pt continuously stated "ouch, ouch, ouch" but continued to not answer this nurses questions. Pt eyes and eyelids noted to be twitching but pt still able to track this nurse. After approx. 3 minutes, pt able to answer questions appropriately. Pt son reports pt has "episodes like this often" but "never any this long". Dorothea Ogle, [place] aware, pt to be transported to XR.

## 2016-07-09 NOTE — ED Notes (Signed)
Dr. Belfi at the bedside.  

## 2016-07-09 NOTE — ED Provider Notes (Signed)
[facility] DEPT Provider Note   CSN: [id number] Arrival date & time: 07/09/16  2017     History   Chief Complaint Chief Complaint  Patient presents with  . Fall  . Leg Injury    HPI Tiffany [name] is a 56 y.o. female.  HPI  56 y.o. female with a hx of DM2, HTN, HLD, CKD, CHF, Asthma, presents to the Emergency Department today due to fall from home. Noted hx of multiple falls from home due to "legs giving out.". Pt states the fall occurred this evening when ambulating to the rest room. Notes collapsing onto right leg and feeling immediate pain. Pt fell between toilet and shower. Notes head trauma without LOC. No N/V. No headaches. No dizziness. No syncope or near syncope. No CP/SOB/ABD pain. Per EMS, noted obvious deformity to right tibia. Splinted in the field. Pt given 137mg Fentanyl en route. Pt is on 6L [place] at home baseline. Pt O2 saturation 87% after administration of fentanyl on arrival via EMS and placed on 15L [place] with O2 saturations 97%. No other symptoms noted.   Past Medical History:  Diagnosis Date  . Anxiety   . Arthritis    "all over" (04/10/2016)  . Asthma   . CHF (congestive heart failure) ([place]   . Chronic bronchitis ([place]   . Chronic kidney disease    "I see a kidney dr." (04/10/2016)  . Cocaine abuse   . Complication of anesthesia    decreased bp, decreased heart rate  . Depression   . Disorder of nervous system   . Emphysema   . GERD (gastroesophageal reflux disease)   . Heart attack ([facility] 1980s  . History of blood transfusion 1994   "couldn't stop bleeding from my period"  . Hyperlipidemia LDL goal <70   . Hypertension   . Incontinence   . Manic depression ([place]   . On home oxygen therapy    "6L; 24/7" (04/10/2016)  . OSA on CPAP    "wear mask sometimes" (04/10/2016)  . Paranoid ([place]    "sometimes; I'm on RX for it" (04/10/2016)  . Pneumonia    "I've had it several times; haven't had it since 06/2015" (04/10/2016)  . Schizophrenia ([facility]   .  Seasonal allergies   . Seizures ([facility]    "don't know what kind; last one was ~ 1 yr ago" (04/10/2016)  . Sinus trouble   . Stroke ([facility] 1980s   denies residual on 04/10/2016  . Type II diabetes mellitus ([facility]     Patient Active Problem List   Diagnosis Date Noted  . Overactive bladder 06/07/2016  . Gallstones   . Upper abdominal pain   . Cholecystitis 05/09/2016  . QT prolongation   . OSA and COPD overlap syndrome ([place]   . Generalized weakness   . Arthritis   . Bipolar I disorder ([place]   . Hypokalemia 04/21/2016  . Wheezing 04/12/2016  . (HFpEF) heart failure with preserved ejection fraction (HM[place]   . Benign essential HTN 03/22/2016  . Rectal bleeding 12/31/2015  . Bipolar affective disorder, mixed, severe, with psychotic behavior ([place] 11/28/2015  . Cocaine use disorder, severe, dependence ([place] 11/28/2015  . Hyponatremia 11/25/2015  . AKI (acute kidney injury) ([place] 11/24/2015  . Blood in sputum   . Chronic diastolic heart failure ([facility]   . SOB (shortness of breath)   . Acute on chronic respiratory failure with hypoxia and hypercapnia ([facility] 06/22/2015  . Onychomycosis 01/21/2015  . Nasal congestion 12/17/2014  . Tobacco  use disorder 07/22/2014  . COPD (chronic obstructive pulmonary disease) ([place]) 07/08/2014  . Seborrheic keratoses 12/31/2013  . Knee pain, bilateral 01/22/2013  . Seizure ([place]) 01/04/2013  . Health care maintenance 11/25/2012  . Folliculitis 18/84/1660  . History of kidney stones 06/18/2012  . Chronic pain syndrome 06/18/2012  . Muscle spasm 06/18/2012  . Elevated troponin 04/28/2012  . Allergic reaction 04/07/2012  . Itching 09/06/2011  . HSV infection 08/30/2011  . Dyslipidemia 04/24/2011  . Anemia 04/24/2011  . Leukocytosis 04/24/2011  . Diabetic neuropathy ([place]) 04/24/2011  . Obstructive sleep apnea 10/18/2010  . Asthma 10/18/2010  . Morbid obesity ([place]) 10/18/2010  . Type 2 diabetes mellitus ([place]) 10/18/2010    Past Surgical History:    Procedure Laterality Date  . CESAREAN SECTION  1997  . HERNIA REPAIR    . IR PERC CHOLECYSTOSTOMY  05/10/2016  . IR RADIOLOGIST EVAL & MGMT  06/08/2016  . UMBILICAL HERNIA REPAIR  ~ 1963   "that's why I don't have a belly button"  . VAGINAL HYSTERECTOMY      OB History    No data available       Home Medications    Prior to Admission medications   Medication Sig Start Date End Date Taking? Authorizing Provider  albuterol (PROVENTIL) (2.5 MG/3ML) 0.083% nebulizer solution Take 3 mLs (2.5 mg total) by nebulization every 6 (six) hours as needed for wheezing or shortness of breath. 06/13/16   Archie Patten, MD  Alcohol Swabs (B-D SINGLE USE SWABS REGULAR) PADS CHECK CAPILLARY BLOOD GLUCOSE ONE TIME DAILY 02/03/16   Archie Patten, MD  amLODipine (NORVASC) 5 MG tablet Take 1 tablet (5 mg total) by mouth daily. 06/13/16   Archie Patten, MD  aspirin 81 MG EC tablet Take 1 tablet (81 mg total) by mouth daily. Swallow whole. 06/13/16   Archie Patten, MD  atorvastatin (LIPITOR) 40 MG tablet Take 1 tablet (40 mg total) by mouth daily. 06/13/16   Archie Patten, MD  benztropine (COGENTIN) 0.5 MG tablet Take 1 tablet (0.5 mg total) by mouth 2 (two) times daily. 06/19/16 06/19/17  Arfeen, Arlyce Harman, MD  Blood Glucose Monitoring Suppl (ACCU-CHEK AVIVA PLUS) w/Device KIT CHECK BLOOD SUGAR THREE TIMES DAILY 12/02/15   Archie Patten, MD  divalproex ([name] ER) 500 MG 24 hr tablet Take 2 tablets (1,000 mg total) by mouth at bedtime. 06/29/16 06/29/17  Arfeen, Arlyce Harman, MD  escitalopram (LEXAPRO) 20 MG tablet Take 1 tablet (20 mg total) by mouth daily. 06/29/16   Arfeen, Arlyce Harman, MD  fluticasone furoate-vilanterol (BREO ELLIPTA) 100-25 MCG/INH AEPB INHALE 1 PUFF EVERY DAY 06/13/16   Archie Patten, MD  gabapentin (NEURONTIN) 600 MG tablet Take 1 tablet (600 mg total) by mouth 3 (three) times daily. 06/28/16   Archie Patten, MD  glucose blood test strip Use to check blood sugar 3 times daily.  12/05/15   Pucilowska, Herma Ard B, MD  metFORMIN (GLUCOPHAGE-XR) 500 MG 24 hr tablet Take 1 tablet (500 mg total) by mouth 2 (two) times daily. 06/28/16   Archie Patten, MD  mirtazapine (REMERON) 45 MG tablet Take 1 tablet (45 mg total) by mouth at bedtime. 06/29/16   Arfeen, Arlyce Harman, MD  montelukast (SINGULAIR) 10 MG tablet Take 1 tablet (10 mg total) by mouth at bedtime. 06/13/16   Archie Patten, MD  Multiple Vitamin (MULTIVITAMIN WITH MINERALS) TABS tablet Take 1 tablet by mouth daily.    [provider]  naproxen (NAPROXEN DR) 500 MG EC tablet Take 500 mg by mouth 2 (two) times daily as needed (pain).    [provider]  omeprazole (PRILOSEC) 40 MG capsule Take 1 capsule (40 mg total) by mouth 2 (two) times daily. 06/28/16   Archie Patten, MD  oxybutynin (DITROPAN-XL) 5 MG 24 hr tablet Take 5 mg by mouth at bedtime.    [provider]  OXYGEN Inhale 6 L into the lungs daily.    [provider]  potassium chloride SA (K-DUR,KLOR-CON) 20 MEQ tablet Take 2.5 tablets (50 mEq total) by mouth 2 (two) times daily. 07/04/16   Bhagat, Crista Luria, PA  tiotropium (SPIRIVA HANDIHALER) 18 MCG inhalation capsule Place 1 capsule (18 mcg total) into inhaler and inhale daily. 06/13/16   Archie Patten, MD  torsemide (DEMADEX) 20 MG tablet Take 4 tablets (80 mg total) by mouth 2 (two) times daily. 07/04/16 10/02/16  Bhagat, Bhavinkumar, PA  VENTOLIN HFA 108 (90 Base) MCG/ACT inhaler Inhale 2 puffs into the lungs every 6 (six) hours as needed. As needed for wheezing or sob 06/01/16   [provider]  ziprasidone (GEODON) 80 MG capsule Take 1 capsule (80 mg total) by mouth at bedtime. 06/29/16   Arfeen, Arlyce Harman, MD    Family History Family History  Problem Relation Age of Onset  . Cancer Father        prostate  . Cancer Mother        lung  . Depression Mother   . Depression Sister   . Anxiety disorder Sister   . Schizophrenia Sister   . Bipolar disorder  Sister   . Depression Sister   . Depression Brother   . Heart failure Unknown        cousin    Social History Social History  Substance Use Topics  . Smoking status: Former Smoker    Packs/day: 1.50    Years: 38.00    Types: Cigarettes    Start date: 03/13/1977    Quit date: 04/10/2016  . Smokeless tobacco: Never Used  . Alcohol use No     Allergies   Hydroxyzine; Latuda [lurasidone hcl]; Codeine; and Sulfa antibiotics   Review of Systems Review of Systems ROS reviewed and all are negative for acute change except as noted in the HPI.  Physical Exam Updated Vital Signs BP (!) 150/95   Pulse 82   Resp 13   Ht '5\' 4"'  (1.626 m)   Wt 135.2 kg (298 lb)   SpO2 98%   BMI 51.15 kg/m   Physical Exam  Constitutional: [name] oriented to person, place, and time. Vital signs are normal. She appears well-developed and well-nourished. No distress.  Morbidly obese  HENT:  Head: Normocephalic and atraumatic. Head is without raccoon's eyes and without Battle's sign.  Right Ear: Hearing, tympanic membrane, external ear and ear canal normal. No hemotympanum.  Left Ear: Hearing, tympanic membrane, external ear and ear canal normal. No hemotympanum.  Nose: Nose normal.  Mouth/Throat: Uvula is midline, oropharynx is clear and moist and mucous membranes are normal. No trismus in the jaw. No oropharyngeal exudate, posterior oropharyngeal erythema or tonsillar abscesses.  Eyes: Conjunctivae and EOM are normal. Pupils are equal, round, and reactive to light.  Neck: Trachea normal and normal range of motion. Neck supple. No spinous process tenderness and no muscular tenderness present. No tracheal deviation and normal range of motion present.  Cardiovascular: Normal rate, regular rhythm, S1 normal, S2 normal, normal heart sounds,  intact distal pulses and normal pulses.   Pulmonary/Chest: Effort normal. No respiratory distress. She has decreased breath sounds in the right upper field and the left  upper field. She has wheezes in the right upper field, the right lower field, the left upper field and the left lower field. She has no rhonchi. She has no rales.  Abdominal: Normal appearance and bowel sounds are normal. There is no tenderness. There is no rigidity and no guarding.  Noted RUQ drain. Output intact. No erythema or signs of infection.   Musculoskeletal: Normal range of motion.  Obvious deformity noted to right tibia. Splinted. NVI. Distal pulses appreciated. Skin intact. TTP along right knee, ankle, hip. Limited ROM due to pain.   Neurological: [name] alert and oriented to person, place, and time. She has normal strength. No cranial nerve deficit or sensory deficit.  Cranial Nerves:  II: Pupils equal, round, reactive to light III,IV, VI: ptosis not present, extra-ocular motions intact bilaterally  V,VII: smile symmetric, facial light touch sensation equal VIII: hearing grossly normal bilaterally  IX,X: midline uvula rise  XI: bilateral shoulder shrug equal and strong XII: midline tongue extension  Skin: Skin is warm and dry.  Psychiatric: She has a normal mood and affect. Her speech is normal and behavior is normal. Thought content normal.  Nursing note and vitals reviewed.  ED Treatments / Results  Labs (all labs ordered are listed, but only abnormal results are displayed) Labs Reviewed  CBC - Abnormal; Notable for the following:       Result Value   RBC 3.46 (*)    Hemoglobin 9.8 (*)    HCT 35.0 (*)    MCV 101.2 (*)    MCHC 28.0 (*)    RDW 16.0 (*)    All other components within normal limits  BASIC METABOLIC PANEL - Abnormal; Notable for the following:    Creatinine, Ser 1.40 (*)    Calcium 8.6 (*)    GFR calc non Af Amer 41 (*)    GFR calc Af Amer 47 (*)    All other components within normal limits    EKG  EKG Interpretation  Date/Time:  Sunday July 09 2016 20:28:09 EDT Ventricular Rate:  79 PR Interval:    QRS Duration: 72 QT Interval:  384 QTC  Calculation: 441 R Axis:   44 Text Interpretation:  Sinus rhythm Low voltage, precordial leads Minimal ST elevation, anterior leads since last tracing no significant change Confirmed by Malvin Johns [phone]) on 07/09/2016 8:56:21 PM      Radiology Dg Chest 1 View  Result Date: 07/09/2016 CLINICAL DATA:  Fall with leg injury EXAM: CHEST 1 VIEW COMPARISON:  05/30/2016 FINDINGS: Mild cardiomegaly with prominent central pulmonary artery and mild central vascular congestion. No acute consolidation. No pleural effusion. No pneumothorax. IMPRESSION: Cardiomegaly with central vascular congestion. Electronically Signed   By: Donavan Foil M.D.   On: 07/09/2016 23:22   Dg Pelvis 1-2 Views  Result Date: 07/09/2016 CLINICAL DATA:  Fall EXAM: PELVIS - 1-2 VIEW COMPARISON:  12/17/2013 FINDINGS: SI joints do not appear widened. The pubic symphysis is intact. The rami are unremarkable. Mild arthritis at the bilateral hips. No fracture or dislocation. IMPRESSION: No acute osseous abnormality Electronically Signed   By: Donavan Foil M.D.   On: 07/09/2016 23:23   Dg Knee 1-2 Views Right  Result Date: 07/09/2016 CLINICAL DATA:  Fall with injury to the leg EXAM: RIGHT KNEE - 1-2 VIEW COMPARISON:  None. FINDINGS: Fracture through  the proximal shaft of the fibula with displacement and overriding. Mild degenerative changes at the patellofemoral compartment and medial joint space. Trace suprapatellar effusion. IMPRESSION: 1. Displaced proximal fibular fracture 2. Trace joint effusion Electronically Signed   By: Donavan Foil M.D.   On: 07/09/2016 23:19   Dg Tibia/fibula Right  Result Date: 07/09/2016 CLINICAL DATA:  Fall EXAM: RIGHT TIBIA AND FIBULA - 2 VIEW COMPARISON:  None. FINDINGS: Acute fracture involving the proximal shaft of the fibula with 1 bone with of medial displacement of the distal fracture fragment and approximately 16 mm of overriding. 1/2 bone with of posterior displacement of distal fibular fracture  fragment. Additional acute fracture involving the midshaft of the tibia with just under 1 bone with of lateral displacement of distal fracture fragment. About 1/2 bone with of posterior displacement of distal fracture fragment. IMPRESSION: 1. Acute displaced and overriding proximal fibular fracture 2. Acute displaced mid tibial fracture Electronically Signed   By: Donavan Foil M.D.   On: 07/09/2016 23:21   Dg Ankle 2 Views Right  Result Date: 07/09/2016 CLINICAL DATA:  Fall EXAM: RIGHT ANKLE - 2 VIEW COMPARISON:  None. FINDINGS: There is no evidence of fracture, dislocation, or joint effusion. Limited cross-table lateral positioning. IMPRESSION: No acute osseous abnormality Electronically Signed   By: Donavan Foil M.D.   On: 07/09/2016 23:21   Dg Femur Min 2 Views Right  Result Date: 07/09/2016 CLINICAL DATA:  History of fall, deformity to right tibia with pain in the entire right lower leg EXAM: RIGHT FEMUR 2 VIEWS COMPARISON:  None. FINDINGS: Mild patellofemoral degenerative change. No acute fracture or malalignment. Trace suprapatellar effusion. Limited lateral views of the right femur due to habitus and positioning. IMPRESSION: No definite acute osseous abnormality allowing for habitus and limited positioning Electronically Signed   By: Donavan Foil M.D.   On: 07/09/2016 23:17    Procedures Procedures (including critical care time)  Medications Ordered in ED Medications  ipratropium-albuterol (DUONEB) 0.5-2.5 (3) MG/3ML nebulizer solution 3 mL (3 mLs Nebulization Given 07/09/16 2105)  fentaNYL (SUBLIMAZE) injection 100 mcg (100 mcg Intravenous Given 07/09/16 2205)     Initial Impression / Assessment and Plan / ED Course  I have reviewed the triage vital signs and the nursing notes.  Pertinent labs & imaging results that were available during my care of the patient were reviewed by me and considered in my medical decision making (see chart for details).  Final Clinical Impressions(s) /  ED Diagnoses  {I have reviewed and evaluated the relevant laboratory values. {I have reviewed and evaluated the relevant imaging studies. {I have interpreted the relevant EKG. {I have reviewed the relevant previous healthcare records. {I have reviewed EMS Documentation. {I obtained HPI from historian.   ED Course:  Assessment: Pt is a 56 y.o. female with a hx of DM2, HTN, HLD, CKD, CHF, Asthma, presents to the Emergency Department today due to fall from home. Noted hx of multiple falls from home due to "legs giving out.". Pt states the fall occurred this evening when ambulating to the rest room. Notes collapsing onto right leg and feeling immediate pain. Pt fell between toilet and shower. Notes head trauma without LOC. No N/V. No headaches. No dizziness. No syncope or near syncope. No CP/SOB/ABD pain. Per EMS, noted obvious deformity to right tibia. Splinted in the field. Pt given 157mg Fentanyl en route. Pt is on 6L [place] at home baseline. Pt O2 saturation 87% after administration of fentanyl on arrival via EMS  and placed on 15L NR[facility] with O2 saturations 97%. On exam, pt in NAD. Nontoxic/nonseptic appearing. VSS. Afebrile. Lungs with diffuse wheeze. Heart RRR. Abdomen nontender soft. RLE with obvious deformity to tibia. No skin break. NVI. Distal pulses appreciated. Imaging shows displaced proximal fibular fracture as well as mid shaft tibial fracture both of which are displaced. Hip/knee/ankle unremarkable. CXR unremarkable. Given neb treatment. EKG unremarkable. CBC unremarkable. BMP unremarkable. Given analgesia in ED. Consult to Orthopedics (Dr. Ninfa Linden) will consult. Admit to medicine.   Disposition/Plan:  Admit Pt acknowledges and agrees with plan  Supervising Physician Malvin Johns, MD  Final diagnoses:  Closed displaced transverse fracture of shaft of right tibia, initial encounter  Closed fracture of proximal end of right fibula, unspecified fracture morphology, initial encounter  Fall,  initial encounter    New Prescriptions New Prescriptions   No medications on file     Shary Decamp, PA-C 07/09/16 2342    Malvin Johns, MD 07/10/16 0002

## 2016-07-09 NOTE — ED Notes (Signed)
Pt placed back on 6L [place], per [place], [place]. Pt O2 sat remain at 97%.

## 2016-07-09 NOTE — ED Notes (Signed)
Pt transported to XR accompanied by this nurse. Pt alert and oriented at this time with good head control.

## 2016-07-09 NOTE — ED Notes (Signed)
ED Provider at bedside. 

## 2016-07-10 DIAGNOSIS — S82831A Other fracture of upper and lower end of right fibula, initial encounter for closed fracture: Secondary | ICD-10-CM

## 2016-07-10 DIAGNOSIS — J439 Emphysema, unspecified: Secondary | ICD-10-CM | POA: Diagnosis present

## 2016-07-10 DIAGNOSIS — J9621 Acute and chronic respiratory failure with hypoxia: Secondary | ICD-10-CM | POA: Diagnosis not present

## 2016-07-10 DIAGNOSIS — J9612 Chronic respiratory failure with hypercapnia: Secondary | ICD-10-CM

## 2016-07-10 DIAGNOSIS — Z01818 Encounter for other preprocedural examination: Secondary | ICD-10-CM | POA: Diagnosis not present

## 2016-07-10 DIAGNOSIS — E785 Hyperlipidemia, unspecified: Secondary | ICD-10-CM | POA: Diagnosis present

## 2016-07-10 DIAGNOSIS — W19XXXA Unspecified fall, initial encounter: Secondary | ICD-10-CM | POA: Diagnosis not present

## 2016-07-10 DIAGNOSIS — Z7982 Long term (current) use of aspirin: Secondary | ICD-10-CM | POA: Diagnosis not present

## 2016-07-10 DIAGNOSIS — S82201A Unspecified fracture of shaft of right tibia, initial encounter for closed fracture: Secondary | ICD-10-CM | POA: Diagnosis not present

## 2016-07-10 DIAGNOSIS — E1122 Type 2 diabetes mellitus with diabetic chronic kidney disease: Secondary | ICD-10-CM | POA: Diagnosis present

## 2016-07-10 DIAGNOSIS — D62 Acute posthemorrhagic anemia: Secondary | ICD-10-CM | POA: Diagnosis not present

## 2016-07-10 DIAGNOSIS — J9611 Chronic respiratory failure with hypoxia: Secondary | ICD-10-CM | POA: Diagnosis not present

## 2016-07-10 DIAGNOSIS — J449 Chronic obstructive pulmonary disease, unspecified: Secondary | ICD-10-CM | POA: Diagnosis not present

## 2016-07-10 DIAGNOSIS — Z87891 Personal history of nicotine dependence: Secondary | ICD-10-CM | POA: Diagnosis not present

## 2016-07-10 DIAGNOSIS — W1830XA Fall on same level, unspecified, initial encounter: Secondary | ICD-10-CM | POA: Diagnosis present

## 2016-07-10 DIAGNOSIS — I509 Heart failure, unspecified: Secondary | ICD-10-CM

## 2016-07-10 DIAGNOSIS — R52 Pain, unspecified: Secondary | ICD-10-CM | POA: Diagnosis not present

## 2016-07-10 DIAGNOSIS — Z79899 Other long term (current) drug therapy: Secondary | ICD-10-CM | POA: Diagnosis not present

## 2016-07-10 DIAGNOSIS — S82221A Displaced transverse fracture of shaft of right tibia, initial encounter for closed fracture: Principal | ICD-10-CM

## 2016-07-10 DIAGNOSIS — R32 Unspecified urinary incontinence: Secondary | ICD-10-CM | POA: Diagnosis not present

## 2016-07-10 DIAGNOSIS — Z7984 Long term (current) use of oral hypoglycemic drugs: Secondary | ICD-10-CM | POA: Diagnosis not present

## 2016-07-10 DIAGNOSIS — E1140 Type 2 diabetes mellitus with diabetic neuropathy, unspecified: Secondary | ICD-10-CM | POA: Diagnosis present

## 2016-07-10 DIAGNOSIS — Z6841 Body Mass Index (BMI) 40.0 and over, adult: Secondary | ICD-10-CM | POA: Diagnosis not present

## 2016-07-10 DIAGNOSIS — J9622 Acute and chronic respiratory failure with hypercapnia: Secondary | ICD-10-CM | POA: Diagnosis not present

## 2016-07-10 DIAGNOSIS — N179 Acute kidney failure, unspecified: Secondary | ICD-10-CM | POA: Diagnosis not present

## 2016-07-10 DIAGNOSIS — I5032 Chronic diastolic (congestive) heart failure: Secondary | ICD-10-CM | POA: Diagnosis present

## 2016-07-10 DIAGNOSIS — G4733 Obstructive sleep apnea (adult) (pediatric): Secondary | ICD-10-CM | POA: Diagnosis present

## 2016-07-10 DIAGNOSIS — E876 Hypokalemia: Secondary | ICD-10-CM | POA: Diagnosis not present

## 2016-07-10 DIAGNOSIS — R0600 Dyspnea, unspecified: Secondary | ICD-10-CM | POA: Diagnosis not present

## 2016-07-10 DIAGNOSIS — S82409A Unspecified fracture of shaft of unspecified fibula, initial encounter for closed fracture: Secondary | ICD-10-CM | POA: Insufficient documentation

## 2016-07-10 DIAGNOSIS — E662 Morbid (severe) obesity with alveolar hypoventilation: Secondary | ICD-10-CM | POA: Diagnosis present

## 2016-07-10 DIAGNOSIS — F200 Paranoid schizophrenia: Secondary | ICD-10-CM | POA: Diagnosis present

## 2016-07-10 DIAGNOSIS — Z434 Encounter for attention to other artificial openings of digestive tract: Secondary | ICD-10-CM | POA: Diagnosis not present

## 2016-07-10 DIAGNOSIS — F3164 Bipolar disorder, current episode mixed, severe, with psychotic features: Secondary | ICD-10-CM | POA: Diagnosis present

## 2016-07-10 DIAGNOSIS — N182 Chronic kidney disease, stage 2 (mild): Secondary | ICD-10-CM | POA: Diagnosis present

## 2016-07-10 DIAGNOSIS — S82221D Displaced transverse fracture of shaft of right tibia, subsequent encounter for closed fracture with routine healing: Secondary | ICD-10-CM | POA: Diagnosis not present

## 2016-07-10 DIAGNOSIS — R0602 Shortness of breath: Secondary | ICD-10-CM | POA: Diagnosis not present

## 2016-07-10 DIAGNOSIS — S82401A Unspecified fracture of shaft of right fibula, initial encounter for closed fracture: Secondary | ICD-10-CM | POA: Diagnosis not present

## 2016-07-10 DIAGNOSIS — Z9889 Other specified postprocedural states: Secondary | ICD-10-CM | POA: Diagnosis not present

## 2016-07-10 DIAGNOSIS — I130 Hypertensive heart and chronic kidney disease with heart failure and stage 1 through stage 4 chronic kidney disease, or unspecified chronic kidney disease: Secondary | ICD-10-CM | POA: Diagnosis present

## 2016-07-10 HISTORY — DX: Unspecified fracture of shaft of unspecified fibula, initial encounter for closed fracture: S82.409A

## 2016-07-10 LAB — CBC
HCT: 32.1 % — ABNORMAL LOW (ref 36.0–46.0)
HCT: 32.2 % — ABNORMAL LOW (ref 36.0–46.0)
Hemoglobin: 9 g/dL — ABNORMAL LOW (ref 12.0–15.0)
Hemoglobin: 9.1 g/dL — ABNORMAL LOW (ref 12.0–15.0)
MCH: 28.5 pg (ref 26.0–34.0)
MCH: 28.8 pg (ref 26.0–34.0)
MCHC: 28 g/dL — ABNORMAL LOW (ref 30.0–36.0)
MCHC: 28.3 g/dL — ABNORMAL LOW (ref 30.0–36.0)
MCV: 101.6 fL — ABNORMAL HIGH (ref 78.0–100.0)
MCV: 101.9 fL — ABNORMAL HIGH (ref 78.0–100.0)
Platelets: 300 10*3/uL (ref 150–400)
Platelets: 306 10*3/uL (ref 150–400)
RBC: 3.16 MIL/uL — ABNORMAL LOW (ref 3.87–5.11)
RBC: 3.16 MIL/uL — ABNORMAL LOW (ref 3.87–5.11)
RDW: 16.2 % — ABNORMAL HIGH (ref 11.5–15.5)
RDW: 16.2 % — ABNORMAL HIGH (ref 11.5–15.5)
WBC: 8.6 10*3/uL (ref 4.0–10.5)
WBC: 8.8 10*3/uL (ref 4.0–10.5)

## 2016-07-10 LAB — GLUCOSE, CAPILLARY
Glucose-Capillary: 98 mg/dL (ref 65–99)
Glucose-Capillary: 141 mg/dL — ABNORMAL HIGH (ref 65–99)
Glucose-Capillary: 127 mg/dL — ABNORMAL HIGH (ref 65–99)
Glucose-Capillary: 99 mg/dL (ref 65–99)

## 2016-07-10 LAB — COMPREHENSIVE METABOLIC PANEL
ALT: 40 U/L (ref 14–54)
AST: 33 U/L (ref 15–41)
Albumin: 3 g/dL — ABNORMAL LOW (ref 3.5–5.0)
Alkaline Phosphatase: 150 U/L — ABNORMAL HIGH (ref 38–126)
Anion gap: 6 (ref 5–15)
BUN: 17 mg/dL (ref 6–20)
CO2: 34 mmol/L — ABNORMAL HIGH (ref 22–32)
Calcium: 8.6 mg/dL — ABNORMAL LOW (ref 8.9–10.3)
Chloride: 101 mmol/L (ref 101–111)
Creatinine, Ser: 1.39 mg/dL — ABNORMAL HIGH (ref 0.44–1.00)
GFR calc Af Amer: 48 mL/min — ABNORMAL LOW (ref >60)
GFR calc non Af Amer: 41 mL/min — ABNORMAL LOW (ref >60)
Glucose, Bld: 97 mg/dL (ref 65–99)
Potassium: 4.3 mmol/L (ref 3.5–5.1)
Sodium: 141 mmol/L (ref 135–145)
Total Bilirubin: 0.6 mg/dL (ref 0.3–1.2)
Total Protein: 6.1 g/dL — ABNORMAL LOW (ref 6.5–8.1)

## 2016-07-10 LAB — CREATININE, SERUM
Creatinine, Ser: 1.44 mg/dL — ABNORMAL HIGH (ref 0.44–1.00)
GFR calc Af Amer: 46 mL/min — ABNORMAL LOW (ref >60)
GFR calc non Af Amer: 39 mL/min — ABNORMAL LOW (ref >60)

## 2016-07-10 LAB — BLOOD GAS, ARTERIAL
Acid-Base Excess: 9 mmol/L — ABNORMAL HIGH (ref 0.0–2.0)
Bicarbonate: 34.1 mmol/L — ABNORMAL HIGH (ref 20.0–28.0)
Drawn by: 24486
O2 Content: 5 L/min
O2 Saturation: 97.1 %
Patient temperature: 98.6
pCO2 arterial: 58.1 mmHg — ABNORMAL HIGH (ref 32.0–48.0)
pH, Arterial: 7.387 (ref 7.350–7.450)
pO2, Arterial: 87.1 mmHg (ref 83.0–108.0)

## 2016-07-10 LAB — SURGICAL PCR SCREEN
MRSA, PCR: NEGATIVE
Staphylococcus aureus: POSITIVE — AB

## 2016-07-10 LAB — LIPID PANEL
Cholesterol: 113 mg/dL (ref 0–200)
HDL: 51 mg/dL (ref >40)
LDL Cholesterol: 44 mg/dL (ref 0–99)
Total CHOL/HDL Ratio: 2.2 RATIO
Triglycerides: 91 mg/dL (ref <150)
VLDL: 18 mg/dL (ref 0–40)

## 2016-07-10 LAB — VALPROIC ACID LEVEL: Valproic Acid Lvl: 46 ug/mL — ABNORMAL LOW (ref 50.0–100.0)

## 2016-07-10 MED ORDER — TIOTROPIUM BROMIDE MONOHYDRATE 18 MCG IN CAPS
18.0000 ug | ORAL_CAPSULE | Freq: Every day | RESPIRATORY_TRACT | Status: DC
  Administered 2016-07-10 – 2016-07-20 (×9): 18 ug via RESPIRATORY_TRACT
  Filled 2016-07-10 (×3): qty 5

## 2016-07-10 MED ORDER — OXYBUTYNIN CHLORIDE ER 5 MG PO TB24: 5.0000 mg | ORAL_TABLET | Freq: Every morning | ORAL | Status: DC

## 2016-07-10 MED ORDER — MONTELUKAST SODIUM 10 MG PO TABS
10.0000 mg | ORAL_TABLET | Freq: Every day | ORAL | Status: DC
  Administered 2016-07-10 – 2016-07-19 (×11): 10 mg via ORAL
  Filled 2016-07-10 (×11): qty 1

## 2016-07-10 MED ORDER — ACETAMINOPHEN 325 MG PO TABS
650.0000 mg | ORAL_TABLET | Freq: Four times a day (QID) | ORAL | Status: DC | PRN
  Administered 2016-07-10: 650 mg via ORAL
  Filled 2016-07-10: qty 2

## 2016-07-10 MED ORDER — IPRATROPIUM-ALBUTEROL 0.5-2.5 (3) MG/3ML IN SOLN: 3.0000 mL | Freq: Four times a day (QID) | RESPIRATORY_TRACT | Status: AC

## 2016-07-10 MED ORDER — ASPIRIN EC 81 MG PO TBEC
81.0000 mg | DELAYED_RELEASE_TABLET | Freq: Every day | ORAL | Status: DC
  Administered 2016-07-10 – 2016-07-20 (×10): 81 mg via ORAL
  Filled 2016-07-10 (×10): qty 1

## 2016-07-10 MED ORDER — ZIPRASIDONE HCL 80 MG PO CAPS
80.0000 mg | ORAL_CAPSULE | Freq: Every day | ORAL | Status: DC
  Administered 2016-07-10 – 2016-07-19 (×11): 80 mg via ORAL
  Filled 2016-07-10 (×12): qty 1

## 2016-07-10 MED ORDER — BENZTROPINE MESYLATE 1 MG PO TABS
1.0000 mg | ORAL_TABLET | Freq: Every day | ORAL | Status: DC
  Filled 2016-07-10: qty 1

## 2016-07-10 MED ORDER — OXYBUTYNIN CHLORIDE ER 5 MG PO TB24
5.0000 mg | ORAL_TABLET | Freq: Two times a day (BID) | ORAL | Status: DC
  Administered 2016-07-10: 5 mg via ORAL
  Filled 2016-07-10 (×2): qty 1

## 2016-07-10 MED ORDER — ATORVASTATIN CALCIUM 40 MG PO TABS
40.0000 mg | ORAL_TABLET | Freq: Every day | ORAL | Status: DC
  Administered 2016-07-10 – 2016-07-20 (×10): 40 mg via ORAL
  Filled 2016-07-10 (×10): qty 1

## 2016-07-10 MED ORDER — PANTOPRAZOLE SODIUM 40 MG PO TBEC
40.0000 mg | DELAYED_RELEASE_TABLET | Freq: Every day | ORAL | Status: DC
  Administered 2016-07-10 – 2016-07-20 (×10): 40 mg via ORAL
  Filled 2016-07-10 (×11): qty 1

## 2016-07-10 MED ORDER — ESCITALOPRAM OXALATE 20 MG PO TABS
20.0000 mg | ORAL_TABLET | Freq: Every day | ORAL | Status: DC
  Administered 2016-07-10 – 2016-07-20 (×11): 20 mg via ORAL
  Filled 2016-07-10: qty 1
  Filled 2016-07-10: qty 2
  Filled 2016-07-10 (×4): qty 1
  Filled 2016-07-10 (×3): qty 2
  Filled 2016-07-10 (×3): qty 1

## 2016-07-10 MED ORDER — ENOXAPARIN SODIUM 40 MG/0.4ML ~~LOC~~ SOLN
40.0000 mg | Freq: Every day | SUBCUTANEOUS | Status: DC
  Administered 2016-07-10 – 2016-07-11 (×2): 40 mg via SUBCUTANEOUS
  Filled 2016-07-10 (×2): qty 0.4

## 2016-07-10 MED ORDER — IPRATROPIUM-ALBUTEROL 0.5-2.5 (3) MG/3ML IN SOLN: 3.0000 mL | RESPIRATORY_TRACT | Status: DC | PRN

## 2016-07-10 MED ORDER — TORSEMIDE 20 MG PO TABS
40.0000 mg | ORAL_TABLET | Freq: Two times a day (BID) | ORAL | Status: DC
Start: 2016-07-10 — End: 2016-07-20
  Administered 2016-07-10 – 2016-07-20 (×20): 40 mg via ORAL
  Filled 2016-07-10 (×21): qty 2

## 2016-07-10 MED ORDER — TORSEMIDE 20 MG PO TABS: 80.0000 mg | ORAL_TABLET | Freq: Two times a day (BID) | ORAL | Status: DC

## 2016-07-10 MED ORDER — FLUTICASONE FUROATE-VILANTEROL 100-25 MCG/INH IN AEPB
1.0000 | INHALATION_SPRAY | Freq: Every day | RESPIRATORY_TRACT | Status: DC
  Administered 2016-07-10 – 2016-07-20 (×9): 1 via RESPIRATORY_TRACT
  Filled 2016-07-10 (×3): qty 28

## 2016-07-10 MED ORDER — BENZTROPINE MESYLATE 0.5 MG PO TABS: 0.5000 mg | ORAL_TABLET | Freq: Every day | ORAL | Status: DC

## 2016-07-10 MED ORDER — MUPIROCIN 2 % EX OINT
1.0000 "application " | TOPICAL_OINTMENT | Freq: Two times a day (BID) | CUTANEOUS | Status: AC
  Administered 2016-07-10 – 2016-07-15 (×9): 1 via NASAL
  Filled 2016-07-10 (×2): qty 22

## 2016-07-10 MED ORDER — ACETAMINOPHEN 650 MG RE SUPP: 650.0000 mg | Freq: Four times a day (QID) | RECTAL | Status: DC | PRN

## 2016-07-10 MED ORDER — FENTANYL CITRATE (PF) 100 MCG/2ML IJ SOLN
25.0000 ug | INTRAMUSCULAR | Status: DC | PRN
  Administered 2016-07-10 – 2016-07-12 (×10): 25 ug via INTRAVENOUS
  Filled 2016-07-10 (×12): qty 2

## 2016-07-10 MED ORDER — BENZTROPINE MESYLATE 0.5 MG PO TABS
0.5000 mg | ORAL_TABLET | Freq: Every morning | ORAL | Status: DC
  Administered 2016-07-10 – 2016-07-20 (×10): 0.5 mg via ORAL
  Filled 2016-07-10 (×12): qty 1

## 2016-07-10 MED ORDER — CHLORHEXIDINE GLUCONATE CLOTH 2 % EX PADS
6.0000 | MEDICATED_PAD | Freq: Every day | CUTANEOUS | Status: AC
  Administered 2016-07-10 – 2016-07-14 (×5): 6 via TOPICAL

## 2016-07-10 MED ORDER — MIRTAZAPINE 15 MG PO TABS
45.0000 mg | ORAL_TABLET | Freq: Every day | ORAL | Status: DC
  Administered 2016-07-10 – 2016-07-19 (×11): 45 mg via ORAL
  Filled 2016-07-10 (×11): qty 3

## 2016-07-10 MED ORDER — DIVALPROEX SODIUM ER 500 MG PO TB24
1000.0000 mg | ORAL_TABLET | Freq: Every day | ORAL | Status: DC
  Administered 2016-07-10: 1000 mg via ORAL
  Filled 2016-07-10 (×2): qty 2

## 2016-07-10 MED ORDER — INSULIN ASPART 100 UNIT/ML ~~LOC~~ SOLN
0.0000 [IU] | Freq: Three times a day (TID) | SUBCUTANEOUS | Status: DC
  Administered 2016-07-10 – 2016-07-20 (×7): 1 [IU] via SUBCUTANEOUS

## 2016-07-10 MED ORDER — AMLODIPINE BESYLATE 5 MG PO TABS
5.0000 mg | ORAL_TABLET | Freq: Every day | ORAL | Status: DC
  Administered 2016-07-10 – 2016-07-20 (×11): 5 mg via ORAL
  Filled 2016-07-10 (×11): qty 1

## 2016-07-10 MED ORDER — GABAPENTIN 600 MG PO TABS
600.0000 mg | ORAL_TABLET | Freq: Three times a day (TID) | ORAL | Status: DC
  Administered 2016-07-10: 600 mg via ORAL
  Filled 2016-07-10: qty 1

## 2016-07-10 NOTE — H&P (Signed)
[facility] Admission History and Physical Service Pager: [phone]  Patient name: Tiffany [name] Medical record number: [id number] Date of birth: 1960/11/23 Age: 56 y.o. Gender: female  Primary Care Provider: Archie Patten, MD Consultants: Orthopedic Surgery  Code Status: Full   Chief Complaint: Right leg pain and deformity after mechanical fall  Assessment and Plan: Tiffany [name] is a 56 y.o. female with a past medical history significant for CKDII, T2DM, HFpEF, Asthma/COPD, HLD, HTN, Bipolar 1 disorder, paranoid schizophrenia who presented today with right lower extremity pain and deformity and found to have a displaced proximal fibular fracture.   #Right displaced tibia/fibula fracture, acute Patient presented to ED after a mechanical fall at home this evening. Patient reports multiple falls in the past 2 months mostly mechanical due to "leg giving out under her". Patient has a long medications list which could definitely be playing a role her falls. Patient is also morbidly obese (BMI>50) making ambulation difficult. Patient is also diagnosed with T2DM though last A1c was 5.6 but does not exhibit sign of peripheral neuropathy although taking 1800 mg of gabapentin daily. Patient denies any dizziness, or syncope at home. Patient EKG showed NSR making arrhythmias less likely as a possible cause of fall. Patient is on several QTc prolonging medications. BP is within normal limit with normal HR, less concerning for orthostatic hypotension. Fall seem to be caused by weakness in her legs with most likely cause being polypharmacy. Patient was seen by Dr. Ninfa Linden (Ortho) who placed patient in a splint for the time being. Displaced tib/fib fracture usually require intramedullary rod/nail per Ortho, however patient given multiple medical problems might not be a good candidate for surgery. There is a possibility for casting of the leg, but would not be the optimal  treatment plan because of the high likelihood of poor healing. Ortho plan to reassess patient for surgery after medical clearance. --Will admit to [facility] --Will admit to Med-Surg with cardiac monitoring --Vitals per floor protocol --Follow up on Ortho consult, appreciate recs --Follow up on am CBC and CMP --Fentanyl 25 mcg q2 prn for moderate pain --Start bowel regimen while on opiods --Acetaminophen 650 mg q6 prn --Continue splint/cast --Will consult PT/OT  --Will consult pulmonology and cardiology for assistance with medical clearance given cardiac and pulmonary comorbidities   #Asthma/COPD, chronic respiratory failure, stable Patient noted to have some wheezing bilateral on lung exam. Patient is on 6L [place] at home. Patient denies any cough of shortness of breath. Patient also have a history of HFpEF but CXR showed no acute consolidation, no pleural effusion or pneumothorax. Patient does have cardiomegaly consistent with CHF diagnosis. No fever, or leukocytosis concerning for infectious process. Patient received one Duoneb treatment in the ED with minimal change in respiratory status. --Will continue Oxygen therapy 6L [place] --Duoneb q6 prn  --Continue Breo Ellipta (Fluticasone furoate-vilanterol) 1 puff daily --Continue Singulair 10 mg qhs  --Continue Tiotropium 18 mcg daily  --Monitor respiratory status  #HFpEF, Chronic Patient was recently seen by cardiologist with some changes made to her home regimen due to concern for volume overload. Last ECHO on 3/20 showed EF 60-65% with G2DD with normal wall motion. Patient does not appear to be fluid overloaded on exam. She has no crackles on lung exam, and mild lower extremity edema. In the past, cardiology have noted patient is prone to accumulating fluid in abdomen, difficult to asses given patient body habitus. Dry weight as recorded by cardiology is  279  lbs, today patient is 298 lbs. Recently ask to increase Torsemide  to 80 mg bid. Patient also on [place] for hypokalemia. --Will reduce Torsemide to 40 mg bid in the setting of AKI though weight up by 20 lbs. --Monitor I/O, difficult to do strict I/O due urinary incontinence and difficulty using the bed pain --Daily weights --Follow up on daily BMP , replete electrolytes as needed --Follow up on am EKG --Continue aspirin 81 mg daily  #Acute on Chronic Kidney Dysfunction, chronic, worsening On admission creatinine was 1.4. Baseline appears to be around 1.1-1.2. Patient has been taking naproxen for arthritis and though advised by PCP to try alternative pain med due to CKD, patient has continue to use NSAIDs likely contributing to worsening kidney function. Patient has tried tramadol in the past with minimal relief in her symptoms --Hold Naproxen, Oxybutynin and Metformin --Follow up on daily BMP --Avoid nephrotoxic agents  #Cholecystostomy, chronic Patient has a cholecystostomy placed in April 2018 after diagnosis of acute cholecystitis. Patient was not a candidate for surgery and had drain place. Patient with good out put about 200-400 ml daily. Patient was seen by IR at the end of May and noted that cystic duct was patent with no gallstones. It appears patient is not a surgical candidate and will keep drain.  - Can consult wound/ostomy care while here if needed   #Bipolar 1 disorder/Paranoid schizophrenia, chronic, stable Patient is followed by Dr. Berniece Andreas, previously at [facility]. Patient's last hospitalization was in Nov 2017 at [facility]. Last visit with psychiatrist on 6/14. Patient  Is on a extensive drug regimen that seem to be working for her. --Will continue divalproex 1066m daily --Will continue escitalopram 20 mg daily --Will continue Mirtazapine 45 mg qhs --Will continue Ziprasidone 80 mg qhs --Will reduce Cogentin to 0.5 mg qhs  Per psych note --Follow up on [name] Level  #Bilateral Knee pain, chronic Patient with history  of bilateral knee pain likely secondary to osteoarthritis in the setting of morbid obesity. Patient seem to have trouble controlling pain has on gabapentin and naproxen with better control of late. Previously on tramadol. --Hold naproxen, CKD II and worsening creatinine --Hold Gabapentin 600 mg tid daily, could reduce dose in setting of falls.  #Hypertension  On admission BP 121/72, with max of 150/95 likely secondary to pain. Patient has taken medications today. Will continue current home regimen given CKDII, hold if hypotensive. --Continue amlodipine 5 mg daily  #Hyperlipidemia Patient with HTN and HFpEF as well as T2DM though last A1c was 5.6. ASCVD based on last lipid panel (12/04/2015) is 7.6%. --Will continue atorvastatin 40 mg daily  #Urinary incontinence, Patient with a history of overactive bladder and urinary incontinence was started on oxybutynin by urology but asked to discontinue during previous hospitalizations with concerning for AKI and worsening kidney function. Appear patient has continue taking despite recommendations. Recently (5/30) referred to outpatient urology by PCP. --Will Hold oxybutynin in the setting of AKI  #T2DM Patient with a diagnosis of T2DM on metformin, however last A1c was 5.6. On Admission , BG was within normal limit. --Will hold metformin in the setting of AKI --Sensitive SSI --Monitor CBG qac/hs  #Obesity [facility] Patient has been trying to make changes to her diet and exercise plan for better weight control. --Will defer to PCP and outpatient nutritionist  #GERD --Protonix 40 mg daily  FEN/GI: Heart healthy/carb modified diet Prophylaxis: Lovenox  Disposition: SNF vs CIR vs Home health pending medical stabilization  and likely surgical intervention  History of Present Illness:  Tiffany [name] is a 56 y.o. female with a past medical history significant forCKDII, T2DM, HFpEF, Asthma/COPD, HLD, HTN, Bipolar 1  disorder, paranoid schizophrenia who presented with left right leg pain and deformity after mechanical fall at home. Patient reports she was gong to the bathroom when all of a sudden " her legs gave out under her" and she felt to the floor. Patient report she heard a snapping sound on her way to the ground. Patient reports hitting head during fall but denie any loss of consciousness. Patient have had multiple fall these past two months. Patient denies any chest pain, dizziness, palpitations, SOB, nausea, vomiting, abdominal pain. Deformity to right lower extremity noted by EMS with splint placed prior to transport to ED. Patient had depression in respiratory drive after fentanyl administration and desatted to 87% and was placed on 15L NR[place] with O2 improving to 97%. In the ED, patient had imaging of right leg with showed a tib/fib fracture of patient's right leg. Patient was also transitioned back to [place] 6L and maintained goof saturation. Pateint received toradol for pain control and a breathing treatment for wheezes. Orthopedic Surgery was consulted given imaging findings. Family medicine teaching service was also consulted for admission for medical stabilization and clearance prior to surgery.  Review Of Systems: Per HPI with the following additions:   Review of Systems  Constitutional: Negative.   HENT: Negative.   Eyes: Negative.   Respiratory: Negative.   Cardiovascular: Negative.   Gastrointestinal: Negative.   Genitourinary: Negative.   Musculoskeletal: Negative.   Skin: Negative.   Neurological: Negative.   Endo/Heme/Allergies: Negative.   Psychiatric/Behavioral: Negative.     Patient Active Problem List   Diagnosis Date Noted  . Fibula fracture 07/10/2016  . Closed displaced transverse fracture of shaft of right tibia   . Overactive bladder 06/07/2016  . Gallstones   . Upper abdominal pain   . Cholecystitis 05/09/2016  . QT prolongation   . OSA and COPD overlap syndrome ([place])   .  Generalized weakness   . Arthritis   . Bipolar I disorder ([place])   . Hypokalemia 04/21/2016  . Wheezing 04/12/2016  . (HFpEF) heart failure with preserved ejection fraction ([place])   . Benign essential HTN 03/22/2016  . Rectal bleeding 12/31/2015  . Bipolar affective disorder, mixed, severe, with psychotic behavior ([place]) 11/28/2015  . Cocaine use disorder, severe, dependence ([place]) 11/28/2015  . Hyponatremia 11/25/2015  . AKI (acute kidney injury) ([place]) 11/24/2015  . Blood in sputum   . Chronic diastolic heart failure ([place])   . SOB (shortness of breath)   . Acute on chronic respiratory failure with hypoxia and hypercapnia ([place]) 06/22/2015  . Onychomycosis 01/21/2015  . Nasal congestion 12/17/2014  . Tobacco use disorder 07/22/2014  . COPD (chronic obstructive pulmonary disease) ([place]) 07/08/2014  . Seborrheic keratoses 12/31/2013  . Knee pain, bilateral 01/22/2013  . Seizure ([place]) 01/04/2013  . Health care maintenance 11/25/2012  . Folliculitis 44/31/5400  . History of kidney stones 06/18/2012  . Chronic pain syndrome 06/18/2012  . Muscle spasm 06/18/2012  . Elevated troponin 04/28/2012  . Allergic reaction 04/07/2012  . Itching 09/06/2011  . HSV infection 08/30/2011  . Dyslipidemia 04/24/2011  . Anemia 04/24/2011  . Leukocytosis 04/24/2011  . Diabetic neuropathy ([place]) 04/24/2011  . Obstructive sleep apnea 10/18/2010  . Asthma 10/18/2010  . Morbid obesity ([place]) 10/18/2010  . Type 2 diabetes mellitus ([place]) 10/18/2010    Past  Medical History: Past Medical History:  Diagnosis Date  . Anxiety   . Arthritis    "all over" (04/10/2016)  . Asthma   . CHF (congestive heart failure) ([place])   . Chronic bronchitis ([place])   . Chronic kidney disease    "I see a kidney dr." (04/10/2016)  . Cocaine abuse   . Complication of anesthesia    decreased bp, decreased heart rate  . Depression   . Disorder of nervous system   . Emphysema   . GERD (gastroesophageal reflux disease)   . Heart  attack ([place]) 1980s  . History of blood transfusion 1994   "couldn't stop bleeding from my period"  . Hyperlipidemia LDL goal <70   . Hypertension   . Incontinence   . Manic depression ([place])   . On home oxygen therapy    "6L; 24/7" (04/10/2016)  . OSA on CPAP    "wear mask sometimes" (04/10/2016)  . Paranoid ([place])    "sometimes; I'm on RX for it" (04/10/2016)  . Pneumonia    "I've had it several times; haven't had it since 06/2015" (04/10/2016)  . Schizophrenia ([place])   . Seasonal allergies   . Seizures ([place])    "don't know what kind; last one was ~ 1 yr ago" (04/10/2016)  . Sinus trouble   . Stroke [facility]) 1980s   denies residual on 04/10/2016  . Type II diabetes mellitus ([place])     Past Surgical History: Past Surgical History:  Procedure Laterality Date  . CESAREAN SECTION  1997  . HERNIA REPAIR    . IR PERC CHOLECYSTOSTOMY  05/10/2016  . IR RADIOLOGIST EVAL & MGMT  06/08/2016  . UMBILICAL HERNIA REPAIR  ~ 1963   "that's why I don't have a belly button"  . VAGINAL HYSTERECTOMY      Social History: Social History  Substance Use Topics  . Smoking status: Former Smoker    Packs/day: 1.50    Years: 38.00    Types: Cigarettes    Start date: 03/13/1977    Quit date: 04/10/2016  . Smokeless tobacco: Never Used  . Alcohol use No   Additional social history:  Please also refer to relevant sections of EMR.  Family History: Family History  Problem Relation Age of Onset  . Cancer Father        prostate  . Cancer Mother        lung  . Depression Mother   . Depression Sister   . Anxiety disorder Sister   . Schizophrenia Sister   . Bipolar disorder Sister   . Depression Sister   . Depression Brother   . Heart failure Unknown        cousin   (If not completed, MUST add something in)  Allergies and Medications: Allergies  Allergen Reactions  . Hydroxyzine Shortness Of Breath    -throat closed up  . Latuda [Lurasidone Hcl] Anaphylaxis  . Codeine Nausea And Vomiting  .  Sulfa Antibiotics Itching   No current facility-administered medications on file prior to encounter.    Current Outpatient Prescriptions on File Prior to Encounter  Medication Sig Dispense Refill  . albuterol (PROVENTIL) (2.5 MG/3ML) 0.083% nebulizer solution Take 3 mLs (2.5 mg total) by nebulization every 6 (six) hours as needed for wheezing or shortness of breath. 150 mL 1  . Alcohol Swabs (B-D SINGLE USE SWABS REGULAR) PADS CHECK CAPILLARY BLOOD GLUCOSE ONE TIME DAILY 100 each 3  . amLODipine (NORVASC) 5 MG tablet Take 1 tablet (5  mg total) by mouth daily. 90 tablet 0  . aspirin 81 MG EC tablet Take 1 tablet (81 mg total) by mouth daily. Swallow whole. 90 tablet 0  . atorvastatin (LIPITOR) 40 MG tablet Take 1 tablet (40 mg total) by mouth daily. 90 tablet 0  . benztropine (COGENTIN) 0.5 MG tablet Take 1 tablet (0.5 mg total) by mouth 2 (two) times daily. (Patient taking differently: Take 1 mg by mouth daily. ) 60 tablet 0  . Blood Glucose Monitoring Suppl (ACCU-CHEK AVIVA PLUS) w/Device KIT CHECK BLOOD SUGAR THREE TIMES DAILY 1 kit 0  . divalproex ([name] ER) 500 MG 24 hr tablet Take 2 tablets (1,000 mg total) by mouth at bedtime. (Patient taking differently: Take 1,000 mg by mouth every morning. ) 60 tablet 1  . escitalopram (LEXAPRO) 20 MG tablet Take 1 tablet (20 mg total) by mouth daily. 30 tablet 1  . fluticasone furoate-vilanterol (BREO ELLIPTA) 100-25 MCG/INH AEPB INHALE 1 PUFF EVERY DAY 180 each 0  . gabapentin (NEURONTIN) 600 MG tablet Take 1 tablet (600 mg total) by mouth 3 (three) times daily. 270 tablet 0  . glucose blood test strip Use to check blood sugar 3 times daily. 100 each 12  . metFORMIN (GLUCOPHAGE-XR) 500 MG 24 hr tablet Take 1 tablet (500 mg total) by mouth 2 (two) times daily. 180 tablet 0  . mirtazapine (REMERON) 45 MG tablet Take 1 tablet (45 mg total) by mouth at bedtime. 30 tablet 1  . montelukast (SINGULAIR) 10 MG tablet Take 1 tablet (10 mg total) by mouth at  bedtime. 90 tablet 1  . Multiple Vitamin (MULTIVITAMIN WITH MINERALS) TABS tablet Take 1 tablet by mouth daily.    . naproxen (NAPROXEN DR) 500 MG EC tablet Take 500 mg by mouth 2 (two) times daily as needed (pain).    [name] omeprazole (PRILOSEC) 40 MG capsule Take 1 capsule (40 mg total) by mouth 2 (two) times daily. 180 capsule 0  . oxybutynin (DITROPAN-XL) 5 MG 24 hr tablet Take 5 mg by mouth 2 (two) times daily.     . OXYGEN Inhale 6 L into the lungs daily.    . potassium chloride SA (K-DUR,KLOR-CON) 20 MEQ tablet Take 2.5 tablets (50 mEq total) by mouth 2 (two) times daily. 150 tablet 2  . tiotropium (SPIRIVA HANDIHALER) 18 MCG inhalation capsule Place 1 capsule (18 mcg total) into inhaler and inhale daily. 90 capsule 1  . torsemide (DEMADEX) 20 MG tablet Take 4 tablets (80 mg total) by mouth 2 (two) times daily. 120 tablet 3  . VENTOLIN HFA 108 (90 Base) MCG/ACT inhaler Inhale 2 puffs into the lungs every 6 (six) hours as needed for wheezing or shortness of breath.     . ziprasidone (GEODON) 80 MG capsule Take 1 capsule (80 mg total) by mouth at bedtime. 30 capsule 1    Objective: BP 109/71 (BP Location: Right Arm)   Pulse 80   Temp 98.4 F (36.9 C) (Oral)   Resp 18   Ht '5\' 4"'  (1.626 m)   Wt 297 lb 15.9 oz (135.2 kg)   SpO2 98%   BMI 51.15 kg/m    Physical Exam: General: Obese woman, in no acute distress, able to participate in exam and answer questions, coherent  Cardiac: RRR, normal heart sounds, no murmurs. 2+ radial and PT pulses bilaterally Respiratory: Mild wheezes noted on exam bilaterally upper and lower lung field, normal effort, rno crackles Abdomen: soft, nontender, nondistended, no hepatic or splenomegaly, +BS, cholecystotomy  bag in place with bilious drainage in bag, no area of erythema noted Extremities: Right lower leg in splint wrapped in ACE bandage, right Tibial deformity noted on exam, left lower leg is normal. 1+ pitting edema in left ankle Skin: warm and dry, no  rashes noted Neuro: alert and oriented x4, no focal deficits Psych: Normal affect and mood  Labs and Imaging: CBC BMET   Recent Labs Lab 07/09/16 2103  WBC 7.1  HGB 9.8*  HCT 35.0*  PLT 321    Recent Labs Lab 07/09/16 2103  NA 141  K 5.1  CL 103  CO2 31  BUN 15  CREATININE 1.40*  GLUCOSE 91  CALCIUM 8.6*     Dg Chest 1 View  Result Date: 07/09/2016 CLINICAL DATA:  Fall with leg injury EXAM: CHEST 1 VIEW COMPARISON:  05/30/2016 FINDINGS: Mild cardiomegaly with prominent central pulmonary artery and mild central vascular congestion. No acute consolidation. No pleural effusion. No pneumothorax. IMPRESSION: Cardiomegaly with central vascular congestion. Electronically Signed   By: Donavan Foil M.D.   On: 07/09/2016 23:22   Dg Pelvis 1-2 Views  Result Date: 07/09/2016 CLINICAL DATA:  Fall EXAM: PELVIS - 1-2 VIEW COMPARISON:  12/17/2013 FINDINGS: SI joints do not appear widened. The pubic symphysis is intact. The rami are unremarkable. Mild arthritis at the bilateral hips. No fracture or dislocation. IMPRESSION: No acute osseous abnormality Electronically Signed   By: Donavan Foil M.D.   On: 07/09/2016 23:23   Dg Knee 1-2 Views Right  Result Date: 07/09/2016 CLINICAL DATA:  Fall with injury to the leg EXAM: RIGHT KNEE - 1-2 VIEW COMPARISON:  None. FINDINGS: Fracture through the proximal shaft of the fibula with displacement and overriding. Mild degenerative changes at the patellofemoral compartment and medial joint space. Trace suprapatellar effusion. IMPRESSION: 1. Displaced proximal fibular fracture 2. Trace joint effusion Electronically Signed   By: Donavan Foil M.D.   On: 07/09/2016 23:19   Dg Tibia/fibula Right  Result Date: 07/09/2016 CLINICAL DATA:  Fall EXAM: RIGHT TIBIA AND FIBULA - 2 VIEW COMPARISON:  None. FINDINGS: Acute fracture involving the proximal shaft of the fibula with 1 bone with of medial displacement of the distal fracture fragment and approximately 16 mm  of overriding. 1/2 bone with of posterior displacement of distal fibular fracture fragment. Additional acute fracture involving the midshaft of the tibia with just under 1 bone with of lateral displacement of distal fracture fragment. About 1/2 bone with of posterior displacement of distal fracture fragment. IMPRESSION: 1. Acute displaced and overriding proximal fibular fracture 2. Acute displaced mid tibial fracture Electronically Signed   By: Donavan Foil M.D.   On: 07/09/2016 23:21   Dg Ankle 2 Views Right  Result Date: 07/09/2016 CLINICAL DATA:  Fall EXAM: RIGHT ANKLE - 2 VIEW COMPARISON:  None. FINDINGS: There is no evidence of fracture, dislocation, or joint effusion. Limited cross-table lateral positioning. IMPRESSION: No acute osseous abnormality Electronically Signed   By: Donavan Foil M.D.   On: 07/09/2016 23:21   Dg Femur Min 2 Views Right  Result Date: 07/09/2016 CLINICAL DATA:  History of fall, deformity to right tibia with pain in the entire right lower leg EXAM: RIGHT FEMUR 2 VIEWS COMPARISON:  None. FINDINGS: Mild patellofemoral degenerative change. No acute fracture or malalignment. Trace suprapatellar effusion. Limited lateral views of the right femur due to habitus and positioning. IMPRESSION: No definite acute osseous abnormality allowing for habitus and limited positioning Electronically Signed   By: Madie Reno.D.  On: 07/09/2016 23:17    Marjie Skiff, MD 07/10/2016, 5:02 AM PGY-1, [place] pager: [phone], text pages welcome  I have separately seen and examined the patient. I have discussed the findings and exam with Dr Andy Gauss and agree with the above note.  My changes/additions are outlined in BLUE.   Smitty Cords, MD [place], PGY-2

## 2016-07-10 NOTE — Progress Notes (Signed)
OT Cancellation    07/10/16 0700  OT Visit Information  Last OT Received On 07/10/16  Reason Eval/Treat Not Completed Patient not medically ready. Pt awaiting for medical clearance for surgery for R tib/fib mid shaft fracture. Acute OT will return as appropriate. Thank you.   [place], OTR/L Acute Rehab Pager: [phone] Office: [phone]

## 2016-07-10 NOTE — ED Notes (Signed)
Ortho paged regarding long leg splint.

## 2016-07-10 NOTE — Progress Notes (Signed)
Patient ID: [name], female   DOB: 05-26-1960, 56 y.o.   MRN: [id number] [name] currently more comfortable in the splint on her right tibia/leg.  She understand that this will not control her fracture and that we are recommending surgery to place a rod/IM nail in the tibia to stabilize the fracture and allow for early mobility.  I spoke to her length about this.  Surgery is NOT planned for today, so he can eat.  She doe need medical clearance prior to proceeding to the OR.  Hopefully surgery such as this can be performed under a regional block combined with spinal anesthesia.  If surgery is not possible, then we will have to place her in a long-leg cast for at least 6-8 weeks, followed by a sort leg cast for the same amount of time.  Even with that, she may not completely heal this fracture.  Will follow closely and plan for surgery later this week if can be cleared medically.

## 2016-07-10 NOTE — Progress Notes (Signed)
[facility]  Patient Status: Active (receiving services up to time of hospitalization)  [facility] is providing the following services: RN, PT and MSW  If patient discharges after hours, please call [phone].   Tiffany [name] 07/10/2016, 1:48 PM

## 2016-07-10 NOTE — Consult Note (Signed)
Name: Tiffany [name] MRN: [id number] DOB: 12/28/1960    ADMISSION DATE:  07/09/2016 CONSULTATION DATE:  6/25  REFERRING MD :  Ninfa Linden   CHIEF COMPLAINT:  Pre-op clearance   BRIEF PATIENT DESCRIPTION: This is a 56 year old female followed by [facility] w/ sig h/o chronic respiratory failure (6 liters [place]) in setting of COPD (FEV1 53% predicted), and morbid obesity w/ untreated OSA. Last seen April 2018. She has since stopped smoking. She was supposed to be going for a sleep study this month but was hospitalized for acute cholecystitis and got a per drain. In addition to the above she also has a sig h/o: schizophrenia and chronic movement d/o from prior neuroleptics for which her psychiatrist had recently changed her cogentin which she claims resulted in increased twitching and spasming. She suffered a mechanical fall on 6/25 resulting in right tib/fib fracture. PCCM was asked for pulmonary clearance   SIGNIFICANT EVENTS    STUDIES:     HISTORY OF PRESENT ILLNESS:  See above   PAST MEDICAL HISTORY :   has a past medical history of Anxiety; Arthritis; Asthma; CHF (congestive heart failure) ([place]); Chronic bronchitis ([place]); Chronic kidney disease; Cocaine abuse; Complication of anesthesia; Depression; Disorder of nervous system; Emphysema; GERD (gastroesophageal reflux disease); Heart attack ([place]) (1980s); History of blood transfusion (1994); Hyperlipidemia LDL goal <70; Hypertension; Incontinence; Manic depression ([place]); On home oxygen therapy; OSA on CPAP; Paranoid ([place]); Pneumonia; Schizophrenia ([place]); Seasonal allergies; Seizures ([place]); Sinus trouble; Stroke [facility]) (1980s); and Type II diabetes mellitus ([place]).  has a past surgical history that includes Cesarean section (1997); Hernia repair; Umbilical hernia repair (~ 1963); Vaginal hysterectomy; IR Perc Cholecystostomy (05/10/2016); and IR Radiologist Eval & Mgmt (06/08/2016). Prior to Admission medications   Medication Sig Start Date End Date  Taking? Authorizing Provider  albuterol (PROVENTIL) (2.5 MG/3ML) 0.083% nebulizer solution Take 3 mLs (2.5 mg total) by nebulization every 6 (six) hours as needed for wheezing or shortness of breath. 06/13/16  Yes Archie Patten, MD  Alcohol Swabs (B-D SINGLE USE SWABS REGULAR) PADS CHECK CAPILLARY BLOOD GLUCOSE ONE TIME DAILY 02/03/16  Yes Archie Patten, MD  amLODipine (NORVASC) 5 MG tablet Take 1 tablet (5 mg total) by mouth daily. 06/13/16  Yes Archie Patten, MD  aspirin 81 MG EC tablet Take 1 tablet (81 mg total) by mouth daily. Swallow whole. 06/13/16  Yes Archie Patten, MD  atorvastatin (LIPITOR) 40 MG tablet Take 1 tablet (40 mg total) by mouth daily. 06/13/16  Yes Archie Patten, MD  benztropine (COGENTIN) 0.5 MG tablet Take 1 tablet (0.5 mg total) by mouth 2 (two) times daily. Patient taking differently: Take 1 mg by mouth daily.  06/19/16 06/19/17 Yes Arfeen, Arlyce Harman, MD  Blood Glucose Monitoring Suppl (ACCU-CHEK AVIVA PLUS) w/Device KIT CHECK BLOOD SUGAR THREE TIMES DAILY 12/02/15  Yes Archie Patten, MD  divalproex ([name] ER) 500 MG 24 hr tablet Take 2 tablets (1,000 mg total) by mouth at bedtime. Patient taking differently: Take 1,000 mg by mouth every morning.  06/29/16 06/29/17 Yes Arfeen, Arlyce Harman, MD  escitalopram (LEXAPRO) 20 MG tablet Take 1 tablet (20 mg total) by mouth daily. 06/29/16  Yes Arfeen, Arlyce Harman, MD  fluticasone furoate-vilanterol (BREO ELLIPTA) 100-25 MCG/INH AEPB INHALE 1 PUFF EVERY DAY 06/13/16  Yes Archie Patten, MD  gabapentin (NEURONTIN) 600 MG tablet Take 1 tablet (600 mg total) by mouth 3 (three) times daily. 06/28/16  Yes Archie Patten, MD  glucose blood test strip Use to check blood sugar 3 times daily. 12/05/15  Yes Pucilowska, Jolanta B, MD  metFORMIN (GLUCOPHAGE-XR) 500 MG 24 hr tablet Take 1 tablet (500 mg total) by mouth 2 (two) times daily. 06/28/16  Yes Archie Patten, MD  mirtazapine (REMERON) 45 MG tablet Take 1 tablet (45 mg total) by  mouth at bedtime. 06/29/16  Yes Arfeen, Arlyce Harman, MD  montelukast (SINGULAIR) 10 MG tablet Take 1 tablet (10 mg total) by mouth at bedtime. 06/13/16  Yes Archie Patten, MD  Multiple Vitamin (MULTIVITAMIN WITH MINERALS) TABS tablet Take 1 tablet by mouth daily.   Yes [provider]  naproxen (NAPROXEN DR) 500 MG EC tablet Take 500 mg by mouth 2 (two) times daily as needed (pain).   Yes [provider]  omeprazole (PRILOSEC) 40 MG capsule Take 1 capsule (40 mg total) by mouth 2 (two) times daily. 06/28/16  Yes Archie Patten, MD  oxybutynin (DITROPAN-XL) 5 MG 24 hr tablet Take 5 mg by mouth 2 (two) times daily.    Yes [provider]  OXYGEN Inhale 6 L into the lungs daily.   Yes [provider]  potassium chloride SA (K-DUR,KLOR-CON) 20 MEQ tablet Take 2.5 tablets (50 mEq total) by mouth 2 (two) times daily. 07/04/16  Yes Bhagat, Bhavinkumar, PA  tiotropium (SPIRIVA HANDIHALER) 18 MCG inhalation capsule Place 1 capsule (18 mcg total) into inhaler and inhale daily. 06/13/16  Yes Archie Patten, MD  torsemide (DEMADEX) 20 MG tablet Take 4 tablets (80 mg total) by mouth 2 (two) times daily. 07/04/16 10/02/16 Yes Bhagat, Bhavinkumar, PA  VENTOLIN HFA 108 (90 Base) MCG/ACT inhaler Inhale 2 puffs into the lungs every 6 (six) hours as needed for wheezing or shortness of breath.  06/01/16  Yes [provider]  ziprasidone (GEODON) 80 MG capsule Take 1 capsule (80 mg total) by mouth at bedtime. 06/29/16  Yes Arfeen, Arlyce Harman, MD   Allergies  Allergen Reactions  . Hydroxyzine Shortness Of Breath    -throat closed up  . Latuda [Lurasidone Hcl] Anaphylaxis  . Codeine Nausea And Vomiting  . Sulfa Antibiotics Itching    FAMILY HISTORY:  family history includes Anxiety disorder in her sister; Bipolar disorder in her sister; Cancer in her father and mother; Depression in her brother, mother, sister, and sister; Schizophrenia in her sister. SOCIAL HISTORY:  reports  that she quit smoking about 2 months ago. Her smoking use included Cigarettes. She started smoking about 39 years ago. She has a 57.00 pack-year smoking history. She has never used smokeless tobacco. She reports that she does not drink alcohol or use drugs.  REVIEW OF SYSTEMS:   Constitutional: Negative for fever, chills, weight loss, malaise/fatigue and diaphoresis.  HENT: Negative for hearing loss, ear pain, nosebleeds, congestion, sore throat, neck pain, tinnitus and ear discharge.   Eyes: Negative for blurred vision, double vision, photophobia, pain, discharge and redness.  Respiratory: Negative for cough, hemoptysis, sputum production, shortness of breath, wheezing and stridor.   Cardiovascular: Negative for chest pain, palpitations, orthopnea, claudication, leg swelling and PND. gets swelling in abd Gastrointestinal: Negative for heartburn, nausea, vomiting, abdominal pain, diarrhea, constipation, blood in stool and melena.  Genitourinary: Negative for dysuria, urgency, frequency, hematuria and flank pain.  Musculoskeletal: Negative for myalgias, back pain, joint pain and falls. right leg pain  Skin: Negative for itching and rash.  Neurological: Negative for dizziness, tingling, tremors, sensory change, speech change, focal weakness, seizures, loss of consciousness,  weakness and headaches.  Endo/Heme/Allergies: Negative for environmental allergies and polydipsia. Does not bruise/bleed easily.  SUBJECTIVE:  Right leg hurts  VITAL SIGNS: Temp:  [98.4 F (36.9 C)] 98.4 F (36.9 C) (06/25 0248) Pulse Rate:  [79-90] 85 (06/25 1023) Resp:  [13-33] 18 (06/25 0248) BP: (109-150)/(64-95) 149/89 (06/25 1023) SpO2:  [94 %-99 %] 94 % (06/25 0902) Weight:  [297 lb 15.9 oz (135.2 kg)-298 lb (135.2 kg)] 297 lb 15.9 oz (135.2 kg) (06/25 0248)  PHYSICAL EXAMINATION: Physical Exam  Constitutional: [name] oriented to person, place, and time. She appears well-developed. She appears distressed.  HENT:   Head: Normocephalic and atraumatic.  Mouth/Throat: Oropharynx is clear and moist. No oropharyngeal exudate.  Eyes: EOM are normal. Pupils are equal, round, and reactive to light. Right eye exhibits no discharge. No scleral icterus.  Neck: Normal range of motion. Neck supple.  Cardiovascular: Normal rate and regular rhythm.   No murmur heard. Pulmonary/Chest: Effort normal and breath sounds normal. No respiratory distress. She has no wheezes.  Abdominal: Soft. Bowel sounds are normal. She exhibits no distension and no mass. There is no tenderness.  Musculoskeletal:       Right shoulder: She exhibits decreased range of motion.       Right knee: She exhibits decreased range of motion.       Legs: Right LE wrapped in immobilizing brace  She has brisk CMS  Neurological: [name] alert and oriented to person, place, and time.  Skin: Skin is warm and dry. Capillary refill takes less than 2 seconds. [name] not diaphoretic. No erythema. No pallor.  Psychiatric: Thought content normal.  She has intermittent twitching and involuntary movements        Recent Labs Lab 07/04/16 1453 07/09/16 2103 07/10/16 0352  NA 145* 141 141  K 4.3 5.1 4.3  CL 96 103 101  CO2 33* 31 34*  BUN _0 CREATININE 1.28* 1.40* 1.39*  1.44*  GLUCOSE 91 91 97    Recent Labs Lab 07/09/16 2103 07/10/16 0352  HGB 9.8* 9.1*  9.0*  HCT 35.0* 32.2*  32.1*  WBC 7.1 8.8  8.6  PLT 321 306  300   Dg Chest 1 View  Result Date: 07/09/2016 CLINICAL DATA:  Fall with leg injury EXAM: CHEST 1 VIEW COMPARISON:  05/30/2016 FINDINGS: Mild cardiomegaly with prominent central pulmonary artery and mild central vascular congestion. No acute consolidation. No pleural effusion. No pneumothorax. IMPRESSION: Cardiomegaly with central vascular congestion. Electronically Signed   By: Donavan Foil M.D.   On: 07/09/2016 23:22   Dg Pelvis 1-2 Views  Result Date: 07/09/2016 CLINICAL DATA:  Fall EXAM: PELVIS - 1-2 VIEW  COMPARISON:  12/17/2013 FINDINGS: SI joints do not appear widened. The pubic symphysis is intact. The rami are unremarkable. Mild arthritis at the bilateral hips. No fracture or dislocation. IMPRESSION: No acute osseous abnormality Electronically Signed   By: Donavan Foil M.D.   On: 07/09/2016 23:23   Dg Knee 1-2 Views Right  Result Date: 07/09/2016 CLINICAL DATA:  Fall with injury to the leg EXAM: RIGHT KNEE - 1-2 VIEW COMPARISON:  None. FINDINGS: Fracture through the proximal shaft of the fibula with displacement and overriding. Mild degenerative changes at the patellofemoral compartment and medial joint space. Trace suprapatellar effusion. IMPRESSION: 1. Displaced proximal fibular fracture 2. Trace joint effusion Electronically Signed   By: Donavan Foil M.D.   On: 07/09/2016 23:19   Dg Tibia/fibula Right  Result Date: 07/09/2016 CLINICAL DATA:  Fall EXAM: RIGHT TIBIA AND FIBULA - 2 VIEW COMPARISON:  None. FINDINGS: Acute fracture involving the proximal shaft of the fibula with 1 bone with of medial displacement of the distal fracture fragment and approximately 16 mm of overriding. 1/2 bone with of posterior displacement of distal fibular fracture fragment. Additional acute fracture involving the midshaft of the tibia with just under 1 bone with of lateral displacement of distal fracture fragment. About 1/2 bone with of posterior displacement of distal fracture fragment. IMPRESSION: 1. Acute displaced and overriding proximal fibular fracture 2. Acute displaced mid tibial fracture Electronically Signed   By: Donavan Foil M.D.   On: 07/09/2016 23:21   Dg Ankle 2 Views Right  Result Date: 07/09/2016 CLINICAL DATA:  Fall EXAM: RIGHT ANKLE - 2 VIEW COMPARISON:  None. FINDINGS: There is no evidence of fracture, dislocation, or joint effusion. Limited cross-table lateral positioning. IMPRESSION: No acute osseous abnormality Electronically Signed   By: Donavan Foil M.D.   On: 07/09/2016 23:21   Dg Femur  Min 2 Views Right  Result Date: 07/09/2016 CLINICAL DATA:  History of fall, deformity to right tibia with pain in the entire right lower leg EXAM: RIGHT FEMUR 2 VIEWS COMPARISON:  None. FINDINGS: Mild patellofemoral degenerative change. No acute fracture or malalignment. Trace suprapatellar effusion. Limited lateral views of the right femur due to habitus and positioning. IMPRESSION: No definite acute osseous abnormality allowing for habitus and limited positioning Electronically Signed   By: Donavan Foil M.D.   On: 07/09/2016 23:17    ASSESSMENT / PLAN: Chronic hypoxic +/- Hypercarbic Respiratory Failure  COPD FEV1 53% predicted OSA +/- OHS (obesity hypoventilation) H/o tobacco abuse (stopped April) Grade 2 diastolic HF Recent acute cholecystitis  Schizophrenia  Movement disorder Diabetes Right tib/fib fracture  High surgical risk  for pulmonary complication   Discussion Chronic respiratory failure in setting of COPD and likely complicated by morbid obesity (suspect obesity hypoventilation) on top of Untreated sleep apnea.  [name] at baseline from pulmonary stand-point but it is certainly concerning the amount of oxygen she requires at baseline as well as what she describes as swelling in the abd from time to time which could represent some secondary effect from cor pulmonale at this point. She also has documented diastolic Heart failure. All of these combined certainly makes her a high risk for pulmonary intra op and post-op complication. From a pulmonary stand-point if can be done locally would recommend that approach and even still she is at high risk. As long as she accepts the risk associated with this surgery is not contraindicated from our stand-point (again we recommend local).   Plan/rec Pulmonary Cleared for local anesthesia for ORIF of right tib/fib (not cleared for general at this point ->pt not agreeable to general anyway) Cont home BDs w/ PRN duoneb abg now Minimize  narcotics BIPAP at HS  We are available as needed Would have at least her first post-op night in step down unit minimally.   Erick Colace ACNP-BC [place] Pager # [phone] OR # [phone] if no answer   07/10/2016, 3:00 PM

## 2016-07-10 NOTE — Consult Note (Signed)
Reason for Consult:  Right tibia/fibula fracture Referring Physician: Shary Decamp, PA-C (EDP)  Tiffany [name] is an 56 y.o. female.  HPI: The patient is a 56 year old female who is brought to the emergency room after mechanical fall injuring her right leg. She had the inability to bear weight and was brought via EMS to the [facility] emergency room. This fall was sustained at home. In the emergency room she was found to have a right tibia/fibula fracture of the midshaft area and orthopedic surgery is consult at for evaluation and treatment of this fracture. The patient does have multiple medical problems and will need medical evaluation and optimization before considering any type of surgical intervention. Her family is at the bedside. The patient does report moderate right leg pain and denies other injuries.  Past Medical History:  Diagnosis Date  . Anxiety   . Arthritis    "all over" (04/10/2016)  . Asthma   . CHF (congestive heart failure) ([place])   . Chronic bronchitis ([place])   . Chronic kidney disease    "I see a kidney dr." (04/10/2016)  . Cocaine abuse   . Complication of anesthesia    decreased bp, decreased heart rate  . Depression   . Disorder of nervous system   . Emphysema   . GERD (gastroesophageal reflux disease)   . Heart attack ([place]) 1980s  . History of blood transfusion 1994   "couldn't stop bleeding from my period"  . Hyperlipidemia LDL goal <70   . Hypertension   . Incontinence   . Manic depression ([place])   . On home oxygen therapy    "6L; 24/7" (04/10/2016)  . OSA on CPAP    "wear mask sometimes" (04/10/2016)  . Paranoid ([place])    "sometimes; I'm on RX for it" (04/10/2016)  . Pneumonia    "I've had it several times; haven't had it since 06/2015" (04/10/2016)  . Schizophrenia ([place])   . Seasonal allergies   . Seizures ([place])    "don't know what kind; last one was ~ 1 yr ago" (04/10/2016)  . Sinus trouble   . Stroke [facility]) 1980s   denies residual on 04/10/2016  . Type II  diabetes mellitus ([place])     Past Surgical History:  Procedure Laterality Date  . CESAREAN SECTION  1997  . HERNIA REPAIR    . IR PERC CHOLECYSTOSTOMY  05/10/2016  . IR RADIOLOGIST EVAL & MGMT  06/08/2016  . UMBILICAL HERNIA REPAIR  ~ 1963   "that's why I don't have a belly button"  . VAGINAL HYSTERECTOMY      Family History  Problem Relation Age of Onset  . Cancer Father        prostate  . Cancer Mother        lung  . Depression Mother   . Depression Sister   . Anxiety disorder Sister   . Schizophrenia Sister   . Bipolar disorder Sister   . Depression Sister   . Depression Brother   . Heart failure Unknown        cousin    Social History:  reports that she quit smoking about 2 months ago. Her smoking use included Cigarettes. She started smoking about 39 years ago. She has a 57.00 pack-year smoking history. She has never used smokeless tobacco. She reports that she does not drink alcohol or use drugs.  Allergies:  Allergies  Allergen Reactions  . Hydroxyzine Shortness Of Breath    -throat closed up  . [place] [  Lurasidone Hcl] Anaphylaxis  . Codeine Nausea And Vomiting  . Sulfa Antibiotics Itching    Medications: I have reviewed the patient's current medications.  Results for orders placed or performed during the hospital encounter of 07/09/16 (from the past 48 hour(s))  CBC     Status: Abnormal   Collection Time: 07/09/16  9:03 PM  Result Value Ref Range   WBC 7.1 4.0 - 10.5 K/uL   RBC 3.46 (L) 3.87 - 5.11 MIL/uL   Hemoglobin 9.8 (L) 12.0 - 15.0 g/dL   HCT 35.0 (L) 36.0 - 46.0 %   MCV 101.2 (H) 78.0 - 100.0 fL   MCH 28.3 26.0 - 34.0 pg   MCHC 28.0 (L) 30.0 - 36.0 g/dL   RDW 16.0 (H) 11.5 - 15.5 %   Platelets 321 150 - 400 K/uL  Basic metabolic panel     Status: Abnormal   Collection Time: 07/09/16  9:03 PM  Result Value Ref Range   Sodium 141 135 - 145 mmol/L   Potassium 5.1 3.5 - 5.1 mmol/L   Chloride 103 101 - 111 mmol/L   CO2 31 22 - 32 mmol/L    Glucose, Bld 91 65 - 99 mg/dL   BUN 15 6 - 20 mg/dL   Creatinine, Ser 1.40 (H) 0.44 - 1.00 mg/dL   Calcium 8.6 (L) 8.9 - 10.3 mg/dL   GFR calc non Af Amer 41 (L) >60 mL/min   GFR calc Af Amer 47 (L) >60 mL/min    Comment: (NOTE) The eGFR has been calculated using the CKD EPI equation. This calculation has not been validated in all clinical situations. eGFR's persistently <60 mL/min signify possible Chronic Kidney Disease.    Anion gap 7 5 - 15    Dg Chest 1 View  Result Date: 07/09/2016 CLINICAL DATA:  Fall with leg injury EXAM: CHEST 1 VIEW COMPARISON:  05/30/2016 FINDINGS: Mild cardiomegaly with prominent central pulmonary artery and mild central vascular congestion. No acute consolidation. No pleural effusion. No pneumothorax. IMPRESSION: Cardiomegaly with central vascular congestion. Electronically Signed   By: Donavan Foil M.D.   On: 07/09/2016 23:22   Dg Pelvis 1-2 Views  Result Date: 07/09/2016 CLINICAL DATA:  Fall EXAM: PELVIS - 1-2 VIEW COMPARISON:  12/17/2013 FINDINGS: SI joints do not appear widened. The pubic symphysis is intact. The rami are unremarkable. Mild arthritis at the bilateral hips. No fracture or dislocation. IMPRESSION: No acute osseous abnormality Electronically Signed   By: Donavan Foil M.D.   On: 07/09/2016 23:23   Dg Knee 1-2 Views Right  Result Date: 07/09/2016 CLINICAL DATA:  Fall with injury to the leg EXAM: RIGHT KNEE - 1-2 VIEW COMPARISON:  None. FINDINGS: Fracture through the proximal shaft of the fibula with displacement and overriding. Mild degenerative changes at the patellofemoral compartment and medial joint space. Trace suprapatellar effusion. IMPRESSION: 1. Displaced proximal fibular fracture 2. Trace joint effusion Electronically Signed   By: Donavan Foil M.D.   On: 07/09/2016 23:19   Dg Tibia/fibula Right  Result Date: 07/09/2016 CLINICAL DATA:  Fall EXAM: RIGHT TIBIA AND FIBULA - 2 VIEW COMPARISON:  None. FINDINGS: Acute fracture involving  the proximal shaft of the fibula with 1 bone with of medial displacement of the distal fracture fragment and approximately 16 mm of overriding. 1/2 bone with of posterior displacement of distal fibular fracture fragment. Additional acute fracture involving the midshaft of the tibia with just under 1 bone with of lateral displacement of distal fracture fragment. About 1/2 bone  with of posterior displacement of distal fracture fragment. IMPRESSION: 1. Acute displaced and overriding proximal fibular fracture 2. Acute displaced mid tibial fracture Electronically Signed   By: Donavan Foil M.D.   On: 07/09/2016 23:21   Dg Ankle 2 Views Right  Result Date: 07/09/2016 CLINICAL DATA:  Fall EXAM: RIGHT ANKLE - 2 VIEW COMPARISON:  None. FINDINGS: There is no evidence of fracture, dislocation, or joint effusion. Limited cross-table lateral positioning. IMPRESSION: No acute osseous abnormality Electronically Signed   By: Donavan Foil M.D.   On: 07/09/2016 23:21   Dg Femur Min 2 Views Right  Result Date: 07/09/2016 CLINICAL DATA:  History of fall, deformity to right tibia with pain in the entire right lower leg EXAM: RIGHT FEMUR 2 VIEWS COMPARISON:  None. FINDINGS: Mild patellofemoral degenerative change. No acute fracture or malalignment. Trace suprapatellar effusion. Limited lateral views of the right femur due to habitus and positioning. IMPRESSION: No definite acute osseous abnormality allowing for habitus and limited positioning Electronically Signed   By: Donavan Foil M.D.   On: 07/09/2016 23:17   I independently reviewed the x-rays and agree that she has an oblique midshaft right tibia/fibular fracture with slight angulation and displacement.   ROS Blood pressure 109/80, pulse 87, resp. rate (!) 31, height _0  (1.626 m), weight 298 lb (135.2 kg), SpO2 97 %. Physical Exam  Constitutional: She appears well-developed and well-nourished.  HENT:  Head: Normocephalic and atraumatic.  Musculoskeletal:        Right lower leg: She exhibits tenderness, bony tenderness, swelling and deformity.   Her right leg shows deformity of the mid tibia. Her compartments are soft. She moves her toes and I'm able to palpate a pulse in her foot. She exhibits diminished sensation in both feet.   Assessment/Plan: Left displaced tibia/fibula fracture  1) This is certainly a difficult situation given her medical status. The standard of care for fractures such as this is usually to treat this surgically with an intramedullary rod/nail. [name] quite concerned about any surgery given her history of heart and lung issues in the past. I talked her in length about the nature of this type of fracture and showed her and her family the x-rays. These types of injuries tend to not do well with casting which be a long-leg cast. Given her obesity, the fracture alignment would be difficult to control and she would likely not be able to heal this type of fracture. Tonight we will put her in a splint for comfort purposes. [name] being admitted to the [facility] Medicine teaching service for hopeful medical optimization before considering a surgical intervention to right tibia. Again I spoke to her in length about this and hopefully having her stabilized medically could help with putting her at ease for considering surgery.  For now we will hold off on definitive treatment until medical clearance is obtained. Again the the recommended treatment would be to place a rod down her tibia to stabilize the fracture and to allow for early weightbearing once this is done. This would diminish her risk of nonunion as well as the risk involved with full immobility. I will continue to follow her closely to determine when the best time for surgery will be hopefully over the next 1-2 days.  Mcarthur Rossetti 07/10/2016, 12:38 AM

## 2016-07-10 NOTE — ED Notes (Signed)
Attempted report x 1. No reply.

## 2016-07-10 NOTE — Progress Notes (Signed)
PT Cancellation Note  Patient Details Name: Tiffany [name] MRN: [id number] DOB: 1960-02-11   Cancelled Treatment:    Reason Eval/Treat Not Completed: Patient not medically ready. Pt awaiting for medical clearance for surgery for R tib/fib mid shaft fracture. Acute PT to return when appropriate.   Kaho Selle M Bran Aldridge 07/10/2016, 7:08 AM   Kittie Plater, PT, DPT Pager #: [phone] Office #: [phone]

## 2016-07-11 ENCOUNTER — Ambulatory Visit: Payer: Self-pay | Admitting: Pharmacist

## 2016-07-11 ENCOUNTER — Telehealth: Payer: Self-pay | Admitting: Family Medicine

## 2016-07-11 ENCOUNTER — Other Ambulatory Visit: Payer: Self-pay | Admitting: *Deleted

## 2016-07-11 DIAGNOSIS — Z434 Encounter for attention to other artificial openings of digestive tract: Secondary | ICD-10-CM

## 2016-07-11 DIAGNOSIS — R52 Pain, unspecified: Secondary | ICD-10-CM

## 2016-07-11 DIAGNOSIS — S82201A Unspecified fracture of shaft of right tibia, initial encounter for closed fracture: Secondary | ICD-10-CM

## 2016-07-11 LAB — CBC
HCT: 32.7 % — ABNORMAL LOW (ref 36.0–46.0)
Hemoglobin: 9.4 g/dL — ABNORMAL LOW (ref 12.0–15.0)
MCH: 28.1 pg (ref 26.0–34.0)
MCHC: 28.7 g/dL — ABNORMAL LOW (ref 30.0–36.0)
MCV: 97.6 fL (ref 78.0–100.0)
Platelets: 324 10*3/uL (ref 150–400)
RBC: 3.35 MIL/uL — ABNORMAL LOW (ref 3.87–5.11)
RDW: 15.8 % — ABNORMAL HIGH (ref 11.5–15.5)
WBC: 8.2 10*3/uL (ref 4.0–10.5)

## 2016-07-11 LAB — COMPREHENSIVE METABOLIC PANEL
ALT: 31 U/L (ref 14–54)
AST: 28 U/L (ref 15–41)
Albumin: 3.1 g/dL — ABNORMAL LOW (ref 3.5–5.0)
Alkaline Phosphatase: 146 U/L — ABNORMAL HIGH (ref 38–126)
Anion gap: 10 (ref 5–15)
BUN: 18 mg/dL (ref 6–20)
CO2: 33 mmol/L — ABNORMAL HIGH (ref 22–32)
Calcium: 9.1 mg/dL (ref 8.9–10.3)
Chloride: 98 mmol/L — ABNORMAL LOW (ref 101–111)
Creatinine, Ser: 1.36 mg/dL — ABNORMAL HIGH (ref 0.44–1.00)
GFR calc Af Amer: 49 mL/min — ABNORMAL LOW (ref >60)
GFR calc non Af Amer: 42 mL/min — ABNORMAL LOW (ref >60)
Glucose, Bld: 77 mg/dL (ref 65–99)
Potassium: 4.1 mmol/L (ref 3.5–5.1)
Sodium: 141 mmol/L (ref 135–145)
Total Bilirubin: 0.3 mg/dL (ref 0.3–1.2)
Total Protein: 6.3 g/dL — ABNORMAL LOW (ref 6.5–8.1)

## 2016-07-11 LAB — GLUCOSE, CAPILLARY
Glucose-Capillary: 102 mg/dL — ABNORMAL HIGH (ref 65–99)
Glucose-Capillary: 107 mg/dL — ABNORMAL HIGH (ref 65–99)
Glucose-Capillary: 123 mg/dL — ABNORMAL HIGH (ref 65–99)
Glucose-Capillary: 67 mg/dL (ref 65–99)
Glucose-Capillary: 83 mg/dL (ref 65–99)
Glucose-Capillary: 93 mg/dL (ref 65–99)

## 2016-07-11 MED ORDER — POLYETHYLENE GLYCOL 3350 17 G PO PACK
17.0000 g | PACK | Freq: Every day | ORAL | Status: DC
  Administered 2016-07-11 – 2016-07-20 (×8): 17 g via ORAL
  Filled 2016-07-11 (×9): qty 1

## 2016-07-11 MED ORDER — SENNA 8.6 MG PO TABS
1.0000 | ORAL_TABLET | Freq: Every day | ORAL | Status: DC
  Administered 2016-07-11 – 2016-07-20 (×8): 8.6 mg via ORAL
  Filled 2016-07-11 (×9): qty 1

## 2016-07-11 MED ORDER — DEXTROSE 5 % IV SOLN
3.0000 g | INTRAVENOUS | Status: AC
  Administered 2016-07-12: 3 g via INTRAVENOUS
  Filled 2016-07-11: qty 3
  Filled 2016-07-11: qty 3000

## 2016-07-11 MED ORDER — GABAPENTIN 600 MG PO TABS
600.0000 mg | ORAL_TABLET | Freq: Three times a day (TID) | ORAL | Status: DC
  Administered 2016-07-11 – 2016-07-20 (×27): 600 mg via ORAL
  Filled 2016-07-11 (×27): qty 1

## 2016-07-11 NOTE — Patient Outreach (Signed)
[facility]) Care Management  07/11/2016  Tiffany [name] 1960/11/26 [id number]   Noted that member was admitted to hospital on 6/24 with a tib/fib fracture after a fall.  Per notes, she will have surgery to repair on tomorrow.  Hospital liaisons notified, will follow up after discharge.  [name], [place], MSN [place] [phone]

## 2016-07-11 NOTE — Consult Note (Signed)
ORTHOPAEDIC CONSULTATION  REQUESTING PHYSICIAN: Dr. Gwendlyn Deutscher  Chief Complaint: Right tib-fib fracture  HPI: Tiffany [name] is a 56 y.o. female who sustained a displaced right tib-fib fx from a mechanical fall and was brought to [place].  She has multiple medical problems and has needed medical optimization prior to surgery.  [name] on 6L of oxygen chronically, OSA on cpap, CHF. I was asked by my partner, Dr. Ninfa Linden, for assistance in caring for her orthopedic injury.    Past Medical History:  Diagnosis Date  . Anxiety   . Arthritis    "all over" (04/10/2016)  . Asthma   . CHF (congestive heart failure) ([place])   . Chronic bronchitis ([place])   . Chronic kidney disease    "I see a kidney dr." (04/10/2016)  . Cocaine abuse   . Complication of anesthesia    decreased bp, decreased heart rate  . Depression   . Disorder of nervous system   . Emphysema   . GERD (gastroesophageal reflux disease)   . Heart attack ([place]) 1980s  . History of blood transfusion 1994   "couldn't stop bleeding from my period"  . Hyperlipidemia LDL goal <70   . Hypertension   . Incontinence   . Manic depression ([place])   . On home oxygen therapy    "6L; 24/7" (04/10/2016)  . OSA on CPAP    "wear mask sometimes" (04/10/2016)  . Paranoid ([place])    "sometimes; I'm on RX for it" (04/10/2016)  . Pneumonia    "I've had it several times; haven't had it since 06/2015" (04/10/2016)  . Schizophrenia ([place])   . Seasonal allergies   . Seizures ([place])    "don't know what kind; last one was ~ 1 yr ago" (04/10/2016)  . Sinus trouble   . Stroke [facility]) 1980s   denies residual on 04/10/2016  . Type II diabetes mellitus ([place])    Past Surgical History:  Procedure Laterality Date  . CESAREAN SECTION  1997  . HERNIA REPAIR    . IR PERC CHOLECYSTOSTOMY  05/10/2016  . IR RADIOLOGIST EVAL & MGMT  06/08/2016  . UMBILICAL HERNIA REPAIR  ~ 1963   "that's why I don't have a belly button"  . VAGINAL HYSTERECTOMY     Social  History   Social History  . Marital status: Widowed    Spouse name: N/A  . Number of children: 3  . Years of education: N/A   Occupational History  . disabled     factory production   Social History Main Topics  . Smoking status: Former Smoker    Packs/day: 1.50    Years: 38.00    Types: Cigarettes    Start date: 03/13/1977    Quit date: 04/10/2016  . Smokeless tobacco: Never Used  . Alcohol use No  . Drug use: No     Comment: 04/10/2016 "last used cocaine back in November 2017"  . Sexual activity: No   Other Topics Concern  . None   Social History Narrative   Has 1 son, [name]   Lives with son and his boyfriend   Her house has ramps and handrails should she ever needs them.    Her mother lives down the street from her and is a good support person in addition to her son.   She drives herself, has private transportation.    Cocaine free since 02/24/16, smoke free since 04/10/16      Family History  Problem Relation Age of Onset  .  Cancer Father        prostate  . Cancer Mother        lung  . Depression Mother   . Depression Sister   . Anxiety disorder Sister   . Schizophrenia Sister   . Bipolar disorder Sister   . Depression Sister   . Depression Brother   . Heart failure Unknown        cousin   - negative except otherwise stated in the family history section Allergies  Allergen Reactions  . Hydroxyzine Shortness Of Breath    -throat closed up  . Latuda [Lurasidone Hcl] Anaphylaxis  . Codeine Nausea And Vomiting  . Sulfa Antibiotics Itching   Prior to Admission medications   Medication Sig Start Date End Date Taking? Authorizing Provider  albuterol (PROVENTIL) (2.5 MG/3ML) 0.083% nebulizer solution Take 3 mLs (2.5 mg total) by nebulization every 6 (six) hours as needed for wheezing or shortness of breath. 06/13/16  Yes Archie Patten, MD  Alcohol Swabs (B-D SINGLE USE SWABS REGULAR) PADS CHECK CAPILLARY BLOOD GLUCOSE ONE TIME DAILY 02/03/16  Yes Archie Patten, MD  amLODipine (NORVASC) 5 MG tablet Take 1 tablet (5 mg total) by mouth daily. 06/13/16  Yes Archie Patten, MD  aspirin 81 MG EC tablet Take 1 tablet (81 mg total) by mouth daily. Swallow whole. 06/13/16  Yes Archie Patten, MD  atorvastatin (LIPITOR) 40 MG tablet Take 1 tablet (40 mg total) by mouth daily. 06/13/16  Yes Archie Patten, MD  benztropine (COGENTIN) 0.5 MG tablet Take 1 tablet (0.5 mg total) by mouth 2 (two) times daily. Patient taking differently: Take 1 mg by mouth daily.  06/19/16 06/19/17 Yes Arfeen, Arlyce Harman, MD  Blood Glucose Monitoring Suppl (ACCU-CHEK AVIVA PLUS) w/Device KIT CHECK BLOOD SUGAR THREE TIMES DAILY 12/02/15  Yes Archie Patten, MD  divalproex ([name] ER) 500 MG 24 hr tablet Take 2 tablets (1,000 mg total) by mouth at bedtime. Patient taking differently: Take 1,000 mg by mouth every morning.  06/29/16 06/29/17 Yes Arfeen, Arlyce Harman, MD  escitalopram (LEXAPRO) 20 MG tablet Take 1 tablet (20 mg total) by mouth daily. 06/29/16  Yes Arfeen, Arlyce Harman, MD  fluticasone furoate-vilanterol (BREO ELLIPTA) 100-25 MCG/INH AEPB INHALE 1 PUFF EVERY DAY 06/13/16  Yes Archie Patten, MD  gabapentin (NEURONTIN) 600 MG tablet Take 1 tablet (600 mg total) by mouth 3 (three) times daily. 06/28/16  Yes Archie Patten, MD  glucose blood test strip Use to check blood sugar 3 times daily. 12/05/15  Yes Pucilowska, Jolanta B, MD  metFORMIN (GLUCOPHAGE-XR) 500 MG 24 hr tablet Take 1 tablet (500 mg total) by mouth 2 (two) times daily. 06/28/16  Yes Archie Patten, MD  mirtazapine (REMERON) 45 MG tablet Take 1 tablet (45 mg total) by mouth at bedtime. 06/29/16  Yes Arfeen, Arlyce Harman, MD  montelukast (SINGULAIR) 10 MG tablet Take 1 tablet (10 mg total) by mouth at bedtime. 06/13/16  Yes Archie Patten, MD  Multiple Vitamin (MULTIVITAMIN WITH MINERALS) TABS tablet Take 1 tablet by mouth daily.   Yes [provider]  naproxen (NAPROXEN DR) 500 MG EC tablet Take 500 mg by  mouth 2 (two) times daily as needed (pain).   Yes [provider]  omeprazole (PRILOSEC) 40 MG capsule Take 1 capsule (40 mg total) by mouth 2 (two) times daily. 06/28/16  Yes Archie Patten, MD  oxybutynin (DITROPAN-XL) 5 MG 24 hr tablet Take 5 mg by  mouth 2 (two) times daily.    Yes [provider]  OXYGEN Inhale 6 L into the lungs daily.   Yes [provider]  potassium chloride SA (K-DUR,KLOR-CON) 20 MEQ tablet Take 2.5 tablets (50 mEq total) by mouth 2 (two) times daily. 07/04/16  Yes Bhagat, Bhavinkumar, PA  tiotropium (SPIRIVA HANDIHALER) 18 MCG inhalation capsule Place 1 capsule (18 mcg total) into inhaler and inhale daily. 06/13/16  Yes Archie Patten, MD  torsemide (DEMADEX) 20 MG tablet Take 4 tablets (80 mg total) by mouth 2 (two) times daily. 07/04/16 10/02/16 Yes Bhagat, Bhavinkumar, PA  VENTOLIN HFA 108 (90 Base) MCG/ACT inhaler Inhale 2 puffs into the lungs every 6 (six) hours as needed for wheezing or shortness of breath.  06/01/16  Yes [provider]  ziprasidone (GEODON) 80 MG capsule Take 1 capsule (80 mg total) by mouth at bedtime. 06/29/16  Yes Arfeen, Arlyce Harman, MD   Dg Chest 1 View  Result Date: 07/09/2016 CLINICAL DATA:  Fall with leg injury EXAM: CHEST 1 VIEW COMPARISON:  05/30/2016 FINDINGS: Mild cardiomegaly with prominent central pulmonary artery and mild central vascular congestion. No acute consolidation. No pleural effusion. No pneumothorax. IMPRESSION: Cardiomegaly with central vascular congestion. Electronically Signed   By: Donavan Foil M.D.   On: 07/09/2016 23:22   Dg Pelvis 1-2 Views  Result Date: 07/09/2016 CLINICAL DATA:  Fall EXAM: PELVIS - 1-2 VIEW COMPARISON:  12/17/2013 FINDINGS: SI joints do not appear widened. The pubic symphysis is intact. The rami are unremarkable. Mild arthritis at the bilateral hips. No fracture or dislocation. IMPRESSION: No acute osseous abnormality Electronically Signed   By: Donavan Foil M.D.    On: 07/09/2016 23:23   Dg Knee 1-2 Views Right  Result Date: 07/09/2016 CLINICAL DATA:  Fall with injury to the leg EXAM: RIGHT KNEE - 1-2 VIEW COMPARISON:  None. FINDINGS: Fracture through the proximal shaft of the fibula with displacement and overriding. Mild degenerative changes at the patellofemoral compartment and medial joint space. Trace suprapatellar effusion. IMPRESSION: 1. Displaced proximal fibular fracture 2. Trace joint effusion Electronically Signed   By: Donavan Foil M.D.   On: 07/09/2016 23:19   Dg Tibia/fibula Right  Result Date: 07/09/2016 CLINICAL DATA:  Fall EXAM: RIGHT TIBIA AND FIBULA - 2 VIEW COMPARISON:  None. FINDINGS: Acute fracture involving the proximal shaft of the fibula with 1 bone with of medial displacement of the distal fracture fragment and approximately 16 mm of overriding. 1/2 bone with of posterior displacement of distal fibular fracture fragment. Additional acute fracture involving the midshaft of the tibia with just under 1 bone with of lateral displacement of distal fracture fragment. About 1/2 bone with of posterior displacement of distal fracture fragment. IMPRESSION: 1. Acute displaced and overriding proximal fibular fracture 2. Acute displaced mid tibial fracture Electronically Signed   By: Donavan Foil M.D.   On: 07/09/2016 23:21   Dg Ankle 2 Views Right  Result Date: 07/09/2016 CLINICAL DATA:  Fall EXAM: RIGHT ANKLE - 2 VIEW COMPARISON:  None. FINDINGS: There is no evidence of fracture, dislocation, or joint effusion. Limited cross-table lateral positioning. IMPRESSION: No acute osseous abnormality Electronically Signed   By: Donavan Foil M.D.   On: 07/09/2016 23:21   Dg Femur Min 2 Views Right  Result Date: 07/09/2016 CLINICAL DATA:  History of fall, deformity to right tibia with pain in the entire right lower leg EXAM: RIGHT FEMUR 2 VIEWS COMPARISON:  None. FINDINGS: Mild patellofemoral degenerative change. No acute  fracture or malalignment. Trace  suprapatellar effusion. Limited lateral views of the right femur due to habitus and positioning. IMPRESSION: No definite acute osseous abnormality allowing for habitus and limited positioning Electronically Signed   By: Donavan Foil M.D.   On: 07/09/2016 23:17   - pertinent xrays, CT, MRI studies were reviewed and independently interpreted  Positive ROS: All other systems have been reviewed and were otherwise negative with the exception of those mentioned in the HPI and as above.  Physical Exam: General: Alert, no acute distress Cardiovascular: No pedal edema Respiratory: No cyanosis, no use of accessory musculature GI: No organomegaly, abdomen is soft and non-tender Skin: No lesions in the area of chief complaint Neurologic: Sensation intact distally Psychiatric: Patient is competent for consent with normal mood and affect Lymphatic: No axillary or cervical lymphadenopathy  MUSCULOSKELETAL:  - soft compartments - well fitting splint - toes wwp  Assessment: Right displaced midshaft tibia fracture OSA on cpap CHF Chronic oxygen  Plan: - patient has poor pulmonary function and would do poorly with nonop treatment - hopefully she is a candidate for spinal anesthesia and regional - I spoke with her at length regarding surgical vs nonsurgical treatment options, she understands and wishes to proceed - she understands the risks of fat embolism syndrome, DVT, nonunion, malunion, knee pain associated with surgery  Thank you for the consult and the opportunity to see Ms. Everlene Farrier, MD [place] 10:23 PM

## 2016-07-11 NOTE — Progress Notes (Signed)
OT Cancellation    07/11/16 0700  OT Visit Information  Last OT Received On 07/11/16  Reason Eval/Treat Not Completed Patient not medically ready. Continue to wait for pt medical clearance for surgery for R tib/fib mid shaft fracture. Acute OT will return as appropriate. Thank you.   [place], OTR/L Acute Rehab Pager: [phone] Office: [phone]

## 2016-07-11 NOTE — Progress Notes (Signed)
Pt stated that she will call RN to notify RT once she is ready for her NIV machine for the night.

## 2016-07-11 NOTE — Progress Notes (Signed)
PT Cancellation Note  Patient Details Name: TYLESHA GIBEAULT MRN: [id number] DOB: [birth year]   Cancelled Treatment:    Reason Eval/Treat Not Completed: Patient not medically ready (continue to await medical clearance and surgery prior to eval)   Britton Perkinson B Tullio Chausse 07/11/2016, 7:13 AM  Elwyn Reach, [place]

## 2016-07-11 NOTE — Progress Notes (Signed)
Family Medicine Teaching Service Daily Progress Note Intern Pager: [phone]  Patient name: Tiffany [name] Medical record number: [id number] Date of birth: [birth year] Age: 56 y.o. Gender: female  Primary Care Provider: Archie Patten, MD Consultants: Orthopedic Surgery Code Status: Full  Assessment and Plan: Tiffany [name] is a 56 y.o. female with a past medical history significant for CKDII, T2DM, HFpEF, Asthma/COPD, HLD, HTN, Bipolar 1 disorder, paranoid schizophrenia who presented today with right lower extremity pain and deformity and found to have a displaced proximal fibular fracture.   #Right displaced tibia/fibula fracture, acute  Patient seen by orthopedic after pulmonary clearance for tib/fib fracture. Patient will be a candidate for spinal or regional block. Patient is scheduled for operation on Wednesday by Dr.Xu. --Follow up on Ortho consult, appreciate recs --Follow up on am CBC and CMP --Fentanyl 25 mcg q2 prn for moderate pain --Start bowel regimen while on opiods --Acetaminophen 650 mg q6 prn --Continue splint/cast  #Asthma/COPD, chronic respiratory failure, stable Seen by Pulmonary for medical clearance prior to surgery. Patient with chronic hypercarbia most likely secondary to obesity hypoventilation. Patient is former smoker who recently quit, therefore, COPD could also play be playing a role. Patient is obese and was recommended for sleep study for further assessment of possible OSA. Primary team appreciate prompt pulm consult and recommendations. --Will continue Oxygen therapy 6L [place] --Nocturnal Bipap post surgery --Follow up on sleep study, already by PCP --Consider CPAP on discharge until sleep study perform --Duoneb q6 prn  --Continue Breo Ellipta (Fluticasone furoate-vilanterol) 1 puff daily --Continue Singulair 10 mg qhs  --Continue Tiotropium 18 mcg daily    #HFpEF, Chronic Patient was recently seen by cardiologist with some changes made to her home  regimen due to concern for volume overload. Last ECHO on 3/20 showed EF 60-65% with G2DD with normal wall motion. In the past 24 hr UOP net 1,710 ml. Dry weight as recorded by cardiology is 279 lbs, today patient is 306 lbs.  --Will continue with Torsemide 40 mg bid  --Monitor I/O --Daily weights --Follow up on daily BMP , replete electrolytes as needed --Follow up on am EKG --Continue aspirin 81 mg daily  #Acute on Chronic Kidney Dysfunction, chronic, resolving On admission creatinine was 1.4. Baseline appears to be around 1.1-1.2. Patient has been taking naproxen for arthritis likely contributing to worsening kidney function.  --Follow up on daily BMP --Hold Naproxen, Oxybutynin and Metformin --Avoid nephrotoxic agents  #Cholecystostomy, chronic Patient has a cholecystostomy placed in April 2018 after diagnosis of acute cholecystitis. Patient was seen by IR at the end of May and noted that cystic duct was patent with no gallstones. It appears patient is not a surgical candidate and will keep drain. Yesterday during dressing change by nurse it appears drain tubing was dislodged with minimal output noted.  --Will consult IR to determine adequate placement of drain or need for readjustment  #Bipolar 1 disorder/Paranoid schizophrenia, chronic, stable Patient is followed by Dr. Berniece Andreas, previously at [facility]. Patient's last hospitalization was in Nov 2017 at [facility]. Last visit with psychiatrist on 6/14. Patient  Is on a extensive drug regimen that seem to be working for her. --Will continue divalproex 1000mg  daily --Will continue escitalopram 20 mg daily --Will continue Mirtazapine 45 mg qhs --Will continue Ziprasidone 80 mg qhs --Will reduce Cogentin to 0.5 mg qhs  Per psych note --Follow up on [name] Level  #Bilateral Knee pain, chronic Patient with history of bilateral knee pain likely secondary to osteoarthritis  in the setting of morbid obesity. Patient seem  to have trouble controlling pain has on gabapentin and naproxen with better control of late. Previously on tramadol. --Hold naproxen, CKD II and worsening creatinine --Hold Gabapentin 600 mg tid daily, could reduce dose in setting of falls.  #Hypertension  On admission BP 121/72, with max of 150/95 likely secondary to pain. Patient has taken medications today. Will continue current home regimen given CKDII, hold if hypotensive. --Continue amlodipine 5 mg daily  #Hyperlipidemia Patient with HTN and HFpEF as well as T2DM though last A1c was 5.6. ASCVD based on last lipid panel (12/04/2015) is 7.6%. --Will continue atorvastatin 40 mg daily  #Urinary incontinence, Patient with a history of overactive bladder and urinary incontinence was started on oxybutynin by urology but asked to discontinue during previous hospitalizations with concerning for AKI and worsening kidney function.  Recently (5/30) referred to outpatient urology by PCP. --Will Hold oxybutynin in the setting of AKI  #T2DM Patient with a diagnosis of T2DM on metformin, however last A1c was 5.6. On Admission , BG was within normal limit. --Will hold metformin in the setting of AKI --Sensitive SSI --Monitor CBG qac/hs  #Obesity Followed by Dr.Skyes [facility] nutritionist). Patient has been trying to make changes to her diet and exercise plan for better weight control. --Will defer to PCP and outpatient nutritionist  #GERD --Protonix 40 mg daily  FEN/GI: Heart Healthy/ Carb modified Diet PPx: Lovenox  Disposition: SNF vs CIR vs Home health pending medical stabilization and likely surgical intervention  Subjective:  Patient reports that her breathing has improved, she was upset about the cholecystostomy that was dislodged during dressing change yesterday. Patient's tremors have improved and she reports good appetite.  Objective: Temp:  [98 F (36.7 C)] 98 F (36.7 C) (06/25 2300) Pulse Rate:  [81-85] 81 (06/25  2300) Resp:  [18] 18 (06/25 2300) BP: (106-149)/(66-89) 106/66 (06/25 2300) SpO2:  [94 %-95 %] 95 % (06/25 2300)  Physical Exam: General: Obese woman, in no acute distress, able to participate in exam and answer questions, coherent  Cardiac: RRR, normal heart sounds, no murmurs. 2+ radial and PT pulses bilaterally Respiratory: Mild wheezes noted on exam bilaterally upper and lower lung field, normal effort, rno crackles Abdomen: soft, nontender, nondistended, no hepatic or splenomegaly, +BS, cholecystotomy bag in place with bilious drainage in bag, no area of erythema noted Extremities: Right lower leg in splint wrapped in ACE bandage, right Tibial deformity noted on exam, left lower leg is normal. 1+ pitting edema in left ankle Skin: warm and dry, no rashes noted Neuro: alert and oriented x4, no focal deficits Psych: Normal affect and mood Laboratory:  Recent Labs Lab 07/09/16 2103 07/10/16 0352  WBC 7.1 8.8  8.6  HGB 9.8* 9.1*  9.0*  HCT 35.0* 32.2*  32.1*  PLT 321 306  300    Recent Labs Lab 07/04/16 1453 07/09/16 2103 07/10/16 0352  NA 145* 141 141  K 4.3 5.1 4.3  CL 96 103 101  CO2 33* 31 34*  BUN 19 15 17   CREATININE 1.28* 1.40* 1.39*  1.44*  CALCIUM 9.7 8.6* 8.6*  PROT 7.0  --  6.1*  BILITOT <0.2  --  0.6  ALKPHOS 180*  --  150*  ALT 50*  --  40  AST 43*  --  33  GLUCOSE 91 91 97    Imaging/Diagnostic Tests: Dg Chest 1 View  Result Date: 07/09/2016 CLINICAL DATA:  Fall with leg injury EXAM: CHEST 1 VIEW  COMPARISON:  05/30/2016 FINDINGS: Mild cardiomegaly with prominent central pulmonary artery and mild central vascular congestion. No acute consolidation. No pleural effusion. No pneumothorax. IMPRESSION: Cardiomegaly with central vascular congestion. Electronically Signed   By: Donavan Foil M.D.   On: 07/09/2016 23:22   Dg Pelvis 1-2 Views  Result Date: 07/09/2016 CLINICAL DATA:  Fall EXAM: PELVIS - 1-2 VIEW COMPARISON:  12/17/2013 FINDINGS: SI joints  do not appear widened. The pubic symphysis is intact. The rami are unremarkable. Mild arthritis at the bilateral hips. No fracture or dislocation. IMPRESSION: No acute osseous abnormality Electronically Signed   By: Donavan Foil M.D.   On: 07/09/2016 23:23   Dg Knee 1-2 Views Right  Result Date: 07/09/2016 CLINICAL DATA:  Fall with injury to the leg EXAM: RIGHT KNEE - 1-2 VIEW COMPARISON:  None. FINDINGS: Fracture through the proximal shaft of the fibula with displacement and overriding. Mild degenerative changes at the patellofemoral compartment and medial joint space. Trace suprapatellar effusion. IMPRESSION: 1. Displaced proximal fibular fracture 2. Trace joint effusion Electronically Signed   By: Donavan Foil M.D.   On: 07/09/2016 23:19   Dg Tibia/fibula Right  Result Date: 07/09/2016 CLINICAL DATA:  Fall EXAM: RIGHT TIBIA AND FIBULA - 2 VIEW COMPARISON:  None. FINDINGS: Acute fracture involving the proximal shaft of the fibula with 1 bone with of medial displacement of the distal fracture fragment and approximately 16 mm of overriding. 1/2 bone with of posterior displacement of distal fibular fracture fragment. Additional acute fracture involving the midshaft of the tibia with just under 1 bone with of lateral displacement of distal fracture fragment. About 1/2 bone with of posterior displacement of distal fracture fragment. IMPRESSION: 1. Acute displaced and overriding proximal fibular fracture 2. Acute displaced mid tibial fracture Electronically Signed   By: Donavan Foil M.D.   On: 07/09/2016 23:21   Dg Ankle 2 Views Right  Result Date: 07/09/2016 CLINICAL DATA:  Fall EXAM: RIGHT ANKLE - 2 VIEW COMPARISON:  None. FINDINGS: There is no evidence of fracture, dislocation, or joint effusion. Limited cross-table lateral positioning. IMPRESSION: No acute osseous abnormality Electronically Signed   By: Donavan Foil M.D.   On: 07/09/2016 23:21   Dg Femur Min 2 Views Right  Result Date:  07/09/2016 CLINICAL DATA:  History of fall, deformity to right tibia with pain in the entire right lower leg EXAM: RIGHT FEMUR 2 VIEWS COMPARISON:  None. FINDINGS: Mild patellofemoral degenerative change. No acute fracture or malalignment. Trace suprapatellar effusion. Limited lateral views of the right femur due to habitus and positioning. IMPRESSION: No definite acute osseous abnormality allowing for habitus and limited positioning Electronically Signed   By: Donavan Foil M.D.   On: 07/09/2016 23:17    Marjie Skiff, MD 07/11/2016, 3:40 AM PGY-1, [place] Intern pager: [phone], text pages welcome

## 2016-07-11 NOTE — Telephone Encounter (Signed)
Called to touch base with Tiffany [name]. She unfortunately had a fall and broke her leg. [name] planning to have surgery tomorrow. Due to her comorbidities they are planning spinal / regional block.  Patient states she is very scared about the surgery.  When she was having a planned C-section with [name] (her son), she states that right after they made the cut all her vitals went down and they had to put her completely under. She states she did not think to tell this to the inpatient team. Unsure what exactly occurred during that time but will convey this information to the primary inpatient team.  I appreciate all the care being provided to my patient.  Archie Patten, MD [facility] Family Medicine Resident  07/11/2016, 12:04 PM

## 2016-07-11 NOTE — Progress Notes (Signed)
Patient with history of cholecystitis related to gallstone obstruction s/p percutaneous cholecystostomy placed 05/10/16.  Patient has been followed in outpatient clinic due for routine drain care.  Patient admitted to [facility] ED with acute leg pain after sustaining fall at home; found to have acute displaced and overriding proximal fibular fracture, acute displaced mid tibial fracture.  Overnight, RN noticed drain was pulled back 1-2 cm.  IR consulted for evaluation of perc chole drain.  PA assessed and found that drain still appears to be in good position.  She continues to have significant bilious output.  The suture was come loose, however drain remains held in place by stat lock.  Will plan to bring patient to radiology department for drain injection and possible exchange.  Hope to bring today if schedule allows as patient is planning for surgery tomorrow, however patient aware exchange may not happen today.   Brynda Greathouse, MMS RDN PA-C 3:55 PM

## 2016-07-12 ENCOUNTER — Inpatient Hospital Stay (HOSPITAL_COMMUNITY): Payer: Medicare HMO | Admitting: Anesthesiology

## 2016-07-12 ENCOUNTER — Encounter (HOSPITAL_COMMUNITY): Payer: Self-pay | Admitting: Interventional Radiology

## 2016-07-12 ENCOUNTER — Telehealth: Payer: Self-pay | Admitting: Family Medicine

## 2016-07-12 ENCOUNTER — Ambulatory Visit: Payer: Self-pay | Admitting: Emergency Medicine

## 2016-07-12 ENCOUNTER — Encounter (HOSPITAL_COMMUNITY)
Admission: EM | Disposition: A | Discharge status: Home / Self Care | Payer: Self-pay | Source: Home / Self Care | Attending: Family Medicine

## 2016-07-12 ENCOUNTER — Inpatient Hospital Stay (HOSPITAL_COMMUNITY): Payer: Medicare HMO

## 2016-07-12 ENCOUNTER — Ambulatory Visit: Payer: Medicare HMO | Admitting: Pharmacist

## 2016-07-12 DIAGNOSIS — S82201A Unspecified fracture of shaft of right tibia, initial encounter for closed fracture: Secondary | ICD-10-CM

## 2016-07-12 DIAGNOSIS — S82401A Unspecified fracture of shaft of right fibula, initial encounter for closed fracture: Secondary | ICD-10-CM

## 2016-07-12 HISTORY — PX: TIBIA IM NAIL INSERTION: SHX2516

## 2016-07-12 HISTORY — PX: IR SINUS/FIST TUBE CHK-NON GI: IMG673

## 2016-07-12 LAB — GLUCOSE, CAPILLARY
Glucose-Capillary: 113 mg/dL — ABNORMAL HIGH (ref 65–99)
Glucose-Capillary: 122 mg/dL — ABNORMAL HIGH (ref 65–99)
Glucose-Capillary: 87 mg/dL (ref 65–99)
Glucose-Capillary: 125 mg/dL — ABNORMAL HIGH (ref 65–99)

## 2016-07-12 LAB — BASIC METABOLIC PANEL
Anion gap: 8 (ref 5–15)
BUN: 18 mg/dL (ref 6–20)
CO2: 36 mmol/L — ABNORMAL HIGH (ref 22–32)
Calcium: 9.1 mg/dL (ref 8.9–10.3)
Chloride: 97 mmol/L — ABNORMAL LOW (ref 101–111)
Creatinine, Ser: 1.48 mg/dL — ABNORMAL HIGH (ref 0.44–1.00)
GFR calc Af Amer: 44 mL/min — ABNORMAL LOW (ref >60)
GFR calc non Af Amer: 38 mL/min — ABNORMAL LOW (ref >60)
Glucose, Bld: 112 mg/dL — ABNORMAL HIGH (ref 65–99)
Potassium: 3.5 mmol/L (ref 3.5–5.1)
Sodium: 141 mmol/L (ref 135–145)

## 2016-07-12 LAB — CBC
HCT: 31.5 % — ABNORMAL LOW (ref 36.0–46.0)
Hemoglobin: 9.2 g/dL — ABNORMAL LOW (ref 12.0–15.0)
MCH: 28.3 pg (ref 26.0–34.0)
MCHC: 29.2 g/dL — ABNORMAL LOW (ref 30.0–36.0)
MCV: 96.9 fL (ref 78.0–100.0)
Platelets: 338 10*3/uL (ref 150–400)
RBC: 3.25 MIL/uL — ABNORMAL LOW (ref 3.87–5.11)
RDW: 16 % — ABNORMAL HIGH (ref 11.5–15.5)
WBC: 8.8 10*3/uL (ref 4.0–10.5)

## 2016-07-12 SURGERY — INSERTION, INTRAMEDULLARY ROD, TIBIA
Anesthesia: General | Site: Leg Lower | Laterality: Right

## 2016-07-12 MED ORDER — DIVALPROEX SODIUM ER 500 MG PO TB24
1000.0000 mg | ORAL_TABLET | Freq: Every day | ORAL | Status: DC
  Administered 2016-07-12 – 2016-07-19 (×9): 1000 mg via ORAL
  Filled 2016-07-12 (×10): qty 2

## 2016-07-12 MED ORDER — ENOXAPARIN SODIUM 40 MG/0.4ML ~~LOC~~ SOLN
40.0000 mg | SUBCUTANEOUS | Status: DC
  Administered 2016-07-13 – 2016-07-20 (×8): 40 mg via SUBCUTANEOUS
  Filled 2016-07-12 (×8): qty 0.4

## 2016-07-12 MED ORDER — MORPHINE SULFATE (PF) 2 MG/ML IV SOLN
0.5000 mg | INTRAVENOUS | Status: DC | PRN
  Administered 2016-07-12 – 2016-07-13 (×3): 0.5 mg via INTRAVENOUS
  Filled 2016-07-12 (×3): qty 1

## 2016-07-12 MED ORDER — FENTANYL CITRATE (PF) 250 MCG/5ML IJ SOLN
INTRAMUSCULAR | Status: AC
  Filled 2016-07-12: qty 5

## 2016-07-12 MED ORDER — ALUM & MAG HYDROXIDE-SIMETH 200-200-20 MG/5ML PO SUSP: 30.0000 mL | ORAL | Status: DC | PRN

## 2016-07-12 MED ORDER — METHOCARBAMOL 500 MG PO TABS
500.0000 mg | ORAL_TABLET | Freq: Four times a day (QID) | ORAL | Status: DC | PRN
  Administered 2016-07-17 – 2016-07-19 (×4): 500 mg via ORAL
  Filled 2016-07-12 (×4): qty 1

## 2016-07-12 MED ORDER — ACETAMINOPHEN 650 MG RE SUPP: 650.0000 mg | Freq: Four times a day (QID) | RECTAL | Status: DC | PRN

## 2016-07-12 MED ORDER — BUPIVACAINE IN DEXTROSE 0.75-8.25 % IT SOLN
INTRATHECAL | Status: DC | PRN
  Administered 2016-07-12: 1.6 mL via INTRATHECAL

## 2016-07-12 MED ORDER — IPRATROPIUM-ALBUTEROL 0.5-2.5 (3) MG/3ML IN SOLN
RESPIRATORY_TRACT | Status: AC
  Filled 2016-07-12: qty 3

## 2016-07-12 MED ORDER — LIDOCAINE HCL (PF) 1 % IJ SOLN
INTRAMUSCULAR | Status: AC
  Filled 2016-07-12: qty 30

## 2016-07-12 MED ORDER — PROPOFOL 10 MG/ML IV BOLUS
INTRAVENOUS | Status: AC
  Filled 2016-07-12: qty 20

## 2016-07-12 MED ORDER — HYDROCODONE-ACETAMINOPHEN 5-325 MG PO TABS
1.0000 | ORAL_TABLET | Freq: Four times a day (QID) | ORAL | Status: DC | PRN
  Administered 2016-07-13 – 2016-07-15 (×5): 1 via ORAL
  Administered 2016-07-15: 2 via ORAL
  Administered 2016-07-15: 1 via ORAL
  Filled 2016-07-12 (×6): qty 1
  Filled 2016-07-12: qty 2

## 2016-07-12 MED ORDER — MENTHOL 3 MG MT LOZG: 1.0000 | LOZENGE | OROMUCOSAL | Status: DC | PRN

## 2016-07-12 MED ORDER — PHENYLEPHRINE HCL 10 MG/ML IJ SOLN
INTRAMUSCULAR | Status: DC | PRN
  Administered 2016-07-12 (×2): 80 ug via INTRAVENOUS

## 2016-07-12 MED ORDER — HYDROMORPHONE HCL 1 MG/ML IJ SOLN
INTRAMUSCULAR | Status: AC
  Administered 2016-07-12: 0.5 mg via INTRAVENOUS
  Filled 2016-07-12: qty 0.5

## 2016-07-12 MED ORDER — METHOCARBAMOL 1000 MG/10ML IJ SOLN
500.0000 mg | Freq: Four times a day (QID) | INTRAVENOUS | Status: DC | PRN
  Filled 2016-07-12: qty 5

## 2016-07-12 MED ORDER — DEXTROSE 5 % IV SOLN
3.0000 g | Freq: Four times a day (QID) | INTRAVENOUS | Status: AC
  Administered 2016-07-12 – 2016-07-13 (×3): 3 g via INTRAVENOUS
  Filled 2016-07-12 (×4): qty 3000

## 2016-07-12 MED ORDER — ONDANSETRON HCL 4 MG/2ML IJ SOLN: 4.0000 mg | Freq: Four times a day (QID) | INTRAMUSCULAR | Status: DC | PRN

## 2016-07-12 MED ORDER — HYDROMORPHONE HCL 1 MG/ML IJ SOLN
0.2500 mg | INTRAMUSCULAR | Status: DC | PRN
  Administered 2016-07-12 (×3): 0.5 mg via INTRAVENOUS

## 2016-07-12 MED ORDER — LIDOCAINE 2% (20 MG/ML) 5 ML SYRINGE
INTRAMUSCULAR | Status: AC
Start: 2016-07-12 — End: 2016-07-12
  Filled 2016-07-12: qty 5

## 2016-07-12 MED ORDER — HYDROMORPHONE HCL 1 MG/ML IJ SOLN
INTRAMUSCULAR | Status: AC
  Filled 2016-07-12: qty 0.5

## 2016-07-12 MED ORDER — MIDAZOLAM HCL 2 MG/2ML IJ SOLN
INTRAMUSCULAR | Status: AC
  Filled 2016-07-12: qty 2

## 2016-07-12 MED ORDER — FENTANYL CITRATE (PF) 100 MCG/2ML IJ SOLN
INTRAMUSCULAR | Status: DC | PRN
Start: 2016-07-12 — End: 2016-07-12
  Administered 2016-07-12 (×2): 50 ug via INTRAVENOUS

## 2016-07-12 MED ORDER — 0.9 % SODIUM CHLORIDE (POUR BTL) OPTIME
TOPICAL | Status: DC | PRN
  Administered 2016-07-12 (×3): 1000 mL

## 2016-07-12 MED ORDER — ONDANSETRON HCL 4 MG PO TABS: 4.0000 mg | ORAL_TABLET | Freq: Four times a day (QID) | ORAL | Status: DC | PRN

## 2016-07-12 MED ORDER — ORAL CARE MOUTH RINSE
15.0000 mL | Freq: Two times a day (BID) | OROMUCOSAL | Status: DC
  Administered 2016-07-14 – 2016-07-16 (×6): 15 mL via OROMUCOSAL

## 2016-07-12 MED ORDER — ALBUTEROL SULFATE (2.5 MG/3ML) 0.083% IN NEBU
INHALATION_SOLUTION | RESPIRATORY_TRACT | Status: AC
  Filled 2016-07-12: qty 3

## 2016-07-12 MED ORDER — ACETAMINOPHEN 325 MG PO TABS
650.0000 mg | ORAL_TABLET | Freq: Four times a day (QID) | ORAL | Status: DC | PRN
  Administered 2016-07-14: 650 mg via ORAL
  Filled 2016-07-12: qty 2

## 2016-07-12 MED ORDER — OXYCODONE HCL 5 MG PO TABS
5.0000 mg | ORAL_TABLET | ORAL | Status: DC | PRN
  Administered 2016-07-13: 10 mg via ORAL
  Filled 2016-07-12: qty 2

## 2016-07-12 MED ORDER — PROPOFOL 500 MG/50ML IV EMUL
INTRAVENOUS | Status: DC | PRN
  Administered 2016-07-12: 50 ug/kg/min via INTRAVENOUS

## 2016-07-12 MED ORDER — ONDANSETRON HCL 4 MG/2ML IJ SOLN
INTRAMUSCULAR | Status: DC | PRN
  Administered 2016-07-12: 4 mg via INTRAVENOUS

## 2016-07-12 MED ORDER — IOPAMIDOL (ISOVUE-300) INJECTION 61%
INTRAVENOUS | Status: AC
  Administered 2016-07-12: 10 mL
  Filled 2016-07-12: qty 50

## 2016-07-12 MED ORDER — LACTATED RINGERS IV SOLN
INTRAVENOUS | Status: DC
  Administered 2016-07-12 (×2): via INTRAVENOUS

## 2016-07-12 MED ORDER — METOCLOPRAMIDE HCL 5 MG/ML IJ SOLN: 5.0000 mg | Freq: Three times a day (TID) | INTRAMUSCULAR | Status: DC | PRN

## 2016-07-12 MED ORDER — PHENYLEPHRINE HCL 10 MG/ML IJ SOLN
INTRAMUSCULAR | Status: DC | PRN
  Administered 2016-07-12: 50 ug/min via INTRAVENOUS

## 2016-07-12 MED ORDER — MIDAZOLAM HCL 5 MG/5ML IJ SOLN
INTRAMUSCULAR | Status: DC | PRN
  Administered 2016-07-12 (×2): 1 mg via INTRAVENOUS

## 2016-07-12 MED ORDER — PHENOL 1.4 % MT LIQD: 1.0000 | OROMUCOSAL | Status: DC | PRN

## 2016-07-12 MED ORDER — SODIUM CHLORIDE 0.9 % IV SOLN
INTRAVENOUS | Status: DC
  Administered 2016-07-12: 21:00:00 via INTRAVENOUS

## 2016-07-12 MED ORDER — IPRATROPIUM-ALBUTEROL 0.5-2.5 (3) MG/3ML IN SOLN
3.0000 mL | RESPIRATORY_TRACT | Status: DC
  Administered 2016-07-12: 3 mL via RESPIRATORY_TRACT

## 2016-07-12 MED ORDER — METOCLOPRAMIDE HCL 5 MG PO TABS: 5.0000 mg | ORAL_TABLET | Freq: Three times a day (TID) | ORAL | Status: DC | PRN

## 2016-07-12 MED ORDER — CHLORHEXIDINE GLUCONATE 0.12 % MT SOLN
15.0000 mL | Freq: Two times a day (BID) | OROMUCOSAL | Status: DC
  Administered 2016-07-13 – 2016-07-19 (×14): 15 mL via OROMUCOSAL
  Filled 2016-07-12 (×15): qty 15

## 2016-07-12 SURGICAL SUPPLY — 61 items
BANDAGE ACE 4X5 VEL STRL LF (GAUZE/BANDAGES/DRESSINGS) ×1 IMPLANT
BANDAGE ACE 6X5 VEL STRL LF (GAUZE/BANDAGES/DRESSINGS) ×1 IMPLANT
BANDAGE ESMARK 6X9 LF (GAUZE/BANDAGES/DRESSINGS) ×1 IMPLANT
BIT DRILL AO GAMMA 4.2X130 (BIT) ×1 IMPLANT
BIT DRILL AO GAMMA 4.2X180 (BIT) ×1 IMPLANT
BIT DRILL AO GAMMA 4.2X340 (BIT) ×1 IMPLANT
BNDG CMPR 9X6 STRL LF SNTH (GAUZE/BANDAGES/DRESSINGS)
BNDG CMPR MED 10X6 ELC LF (GAUZE/BANDAGES/DRESSINGS) ×1
BNDG ELASTIC 6X10 VLCR STRL LF (GAUZE/BANDAGES/DRESSINGS) ×1 IMPLANT
BNDG ESMARK 6X9 LF (GAUZE/BANDAGES/DRESSINGS)
COVER MAYO STAND STRL (DRAPES) ×1 IMPLANT
COVER SURGICAL LIGHT HANDLE (MISCELLANEOUS) ×2 IMPLANT
CUFF TOURNIQUET SINGLE 34IN LL (TOURNIQUET CUFF) ×2 IMPLANT
DRAPE C-ARM 42X72 X-RAY (DRAPES) ×1 IMPLANT
DRAPE C-ARMOR (DRAPES) ×2 IMPLANT
DRAPE HALF SHEET 40X57 (DRAPES) ×1 IMPLANT
DRAPE IMP U-DRAPE 54X76 (DRAPES) ×1 IMPLANT
DRAPE POUCH INSTRU U-SHP 10X18 (DRAPES) ×1 IMPLANT
DRAPE U-SHAPE 47X51 STRL (DRAPES) ×2 IMPLANT
DRAPE UTILITY XL STRL (DRAPES) ×2 IMPLANT
DRSG MEPILEX BORDER 4X4 (GAUZE/BANDAGES/DRESSINGS) ×2 IMPLANT
DRSG MEPILEX BORDER 4X8 (GAUZE/BANDAGES/DRESSINGS) ×1 IMPLANT
DURAPREP 26ML APPLICATOR (WOUND CARE) ×2 IMPLANT
ELECT CAUTERY BLADE 6.4 (BLADE) ×2 IMPLANT
ELECT REM PT RETURN 9FT ADLT (ELECTROSURGICAL) ×2
ELECTRODE REM PT RTRN 9FT ADLT (ELECTROSURGICAL) ×1 IMPLANT
FACESHIELD WRAPAROUND (MASK) IMPLANT
FACESHIELD WRAPAROUND OR TEAM (MASK) IMPLANT
GAUZE SPONGE 4X4 12PLY STRL (GAUZE/BANDAGES/DRESSINGS) ×1 IMPLANT
GAUZE XEROFORM 1X8 LF (GAUZE/BANDAGES/DRESSINGS) ×2 IMPLANT
GLOVE SKINSENSE NS SZ7.5 (GLOVE) ×4
GLOVE SKINSENSE STRL SZ7.5 (GLOVE) ×4 IMPLANT
GOWN STRL REIN XL XLG (GOWN DISPOSABLE) ×2 IMPLANT
GUIDEROD T2 3X1000 (ROD) ×1 IMPLANT
GUIDEWIRE GAMMA (WIRE) ×2 IMPLANT
K-WIRE FIXATION 3X285 COATED (WIRE) ×4
KIT BASIN OR (CUSTOM PROCEDURE TRAY) ×2 IMPLANT
KWIRE FIXATION 3X285 COATED (WIRE) IMPLANT
MANIFOLD NEPTUNE II (INSTRUMENTS) ×2 IMPLANT
NAIL ELAS INSERT SLV SPI 8-11 (MISCELLANEOUS) ×1 IMPLANT
NAIL TIBIA STD 9X330MM (Nail) ×1 IMPLANT
NS IRRIG 1000ML POUR BTL (IV SOLUTION) ×2 IMPLANT
PACK TOTAL JOINT (CUSTOM PROCEDURE TRAY) ×2 IMPLANT
PACK UNIVERSAL I (CUSTOM PROCEDURE TRAY) ×1 IMPLANT
PAD CAST 4YDX4 CTTN HI CHSV (CAST SUPPLIES) ×2 IMPLANT
PADDING CAST COTTON 4X4 STRL (CAST SUPPLIES)
REAMER INTRAMEDULLARY 8MM 510 (MISCELLANEOUS) ×1 IMPLANT
SCREW LOCK FULL THREAD 05X57.5 (Screw) ×1 IMPLANT
SCREW LOCKING T2 F/T  5MMX30MM (Screw) ×1 IMPLANT
SCREW LOCKING T2 F/T  5MMX40MM (Screw) ×1 IMPLANT
SCREW LOCKING T2 F/T  5MMX50MM (Screw) ×1 IMPLANT
SCREW LOCKING T2 F/T 5MMX30MM (Screw) IMPLANT
SCREW LOCKING T2 F/T 5MMX40MM (Screw) IMPLANT
SCREW LOCKING T2 F/T 5MMX50MM (Screw) IMPLANT
STAPLER SKIN PROX WIDE 3.9 (STAPLE) ×2 IMPLANT
SUT VIC AB 0 CT1 27 (SUTURE) ×4
SUT VIC AB 0 CT1 27XBRD ANTBC (SUTURE) ×2 IMPLANT
SUT VIC AB 2-0 CT1 27 (SUTURE) ×4
SUT VIC AB 2-0 CT1 TAPERPNT 27 (SUTURE) ×2 IMPLANT
TOWEL OR 17X26 10 PK STRL BLUE (TOWEL DISPOSABLE) ×4 IMPLANT
WATER STERILE IRR 1000ML POUR (IV SOLUTION) ×2 IMPLANT

## 2016-07-12 NOTE — Progress Notes (Signed)
Family Medicine Teaching Service Daily Progress Note Intern Pager: [phone]  Patient name: Tiffany [name] Medical record number: [id number] Date of birth: [birth year] Age: 56 y.o. Gender: female  Primary Care Provider: Archie Patten, MD Consultants: Orthopedic Surgery Code Status: Full  Assessment and Plan: Tiffany [name] is a 56 y.o. female with a past medical history significant for CKDII, T2DM, HFpEF, Asthma/COPD, HLD, HTN, Bipolar 1 disorder, paranoid schizophrenia who presented today with right lower extremity pain and deformity and found to have a displaced proximal fibular fracture.   #Right displaced tibia/fibula fracture, acute  Patient seen by orthopedic after pulmonary clearance for tib/fib fracture.  Patient is scheduled for OR today for tib/fib repair by Dr.Xu. --Follow up on Ortho consult, appreciate recs --Follow up on am CBC and CMP --Fentanyl 25 mcg q2 prn for moderate pain --Start bowel regimen while on opiods --Acetaminophen 650 mg q6 prn --Continue splint/cast  #Asthma/COPD, chronic respiratory failure, stable Patient continue to be on 6L [place], was tried on CPAP overnight but did not want to use it. Will require sleep study on discharge to assess for likely OSA. --Nocturnal Bipap post surgery --Consider CPAP on discharge until sleep study perform --Duoneb q6 prn  --Continue Breo Ellipta (Fluticasone furoate-vilanterol) 1 puff daily --Continue Singulair 10 mg qhs  --Continue Tiotropium 18 mcg daily   #HFpEF, Chronic Last ECHO on 3/20 showed EF 60-65% with G2DD with normal wall motion. No overt signs of volume overload on examination of patient. --Continue Torsemide 40 mg bid, increase w/ improved kidney fx --Monitor I/O --Daily weights --Follow up on daily BMP , replete electrolytes as needed --Continue aspirin 81 mg daily  #Acute on Chronic Kidney Dysfunction, chronic, resolving On admission creatinine was 1.4. Baseline appears to be around 1.1-1.2.  This am creatinine was  --Follow up on daily BMP --Hold Naproxen, Oxybutynin and Metformin --Avoid nephrotoxic agents  #Cholecystostomy, chronic Patient has a cholecystostomy placed in April 2018 after diagnosis of acute cholecystitis. Patient was in the IR suite this morning for exchange of her cholecystostomy drain. --Will follow up on IR consult, appreciate recs.  #Bipolar 1 disorder/Paranoid schizophrenia, chronic, stable Patient is followed by Dr. Berniece Andreas, previously at [facility]. Patient's last hospitalization was in Nov 2017 at [facility]. Last visit with psychiatrist on 6/14. Patient  Is on a extensive drug regimen that seem to be working for her. [name] level slightly sub therapeutic --Will continue divalproex 1000mg  daily --Will continue escitalopram 20 mg daily --Will continue Mirtazapine 45 mg qhs --Will continue Ziprasidone 80 mg qhs --Will reduce Cogentin to 0.5 mg qhs  Per psych note  #Bilateral Knee pain, chronic Patient with history of bilateral knee pain likely secondary to osteoarthritis in the setting of morbid obesity. --Continue to hold naproxen, CKD II and worsening creatinine --Continue to hold Gabapentin 600 mg tid daily, could reduce dose in setting of falls.  #Hypertension  On admission BP 121/72, with max of 150/95 likely secondary to pain. Patient has taken medications today. Will continue current home regimen given CKDII, hold if hypotensive. --Continue amlodipine 5 mg daily  #Hyperlipidemia Patient with HTN and HFpEF as well as T2DM though last A1c was 5.6. ASCVD based on last lipid panel (12/04/2015) is 7.6%. --Will continue atorvastatin 40 mg daily  #Urinary incontinence, Patient with a history of overactive bladder and urinary incontinence was started on oxybutynin by urology but asked to discontinue during previous hospitalizations . Given recent falls and polypharmacy with multiple meds with anticholinergic effect, will  likely discontinue oxybutynin and start patient on Miragrebon  --Will Hold oxybutynin in the setting of AKI --Consider starting patient on [place]  #T2DM Patient with a diagnosis of T2DM on metformin, however last A1c was 5.6. On Admission , BG was within normal limit. --Will hold metformin in the setting of AKI --Sensitive SSI --Monitor CBG qac/hs  #Obesity Followed by Dr.Skyes [facility] nutritionist). Patient has been trying to make changes to her diet and exercise plan for better weight control. --Will defer to PCP and outpatient nutritionist  #GERD --Protonix 40 mg daily  FEN/GI: Heart Healthy/ Carb modified Diet PPx: Lovenox  Disposition: SNF vs CIR vs Home health pending medical stabilization and likely surgical intervention  Subjective:  I did not see the patient this morning due to IR procedure. Will see patient later today before surgery  Objective: Temp:  [98 F (36.7 C)-98.6 F (37 C)] 98.6 F (37 C) (06/26 2038) Pulse Rate:  [75-80] 80 (06/26 2038) Resp:  [18] 18 (06/26 2038) BP: (135-138)/(78-85) 138/85 (06/26 2038) SpO2:  [94 %-99 %] 99 % (06/26 2038) Weight:  [306 lb 11.2 oz (139.1 kg)] 306 lb 11.2 oz (139.1 kg) (06/26 0500)  Physical Exam: General: Obese woman, in no acute distress, able to participate in exam and answer questions, coherent  Cardiac: RRR, normal heart sounds, no murmurs. 2+ radial and PT pulses bilaterally Respiratory: Mild wheezes noted on exam bilaterally upper and lower lung field, normal effort, rno crackles Abdomen: soft, nontender, nondistended, no hepatic or splenomegaly, +BS, cholecystotomy bag in place with bilious drainage in bag, no area of erythema noted Extremities: Right lower leg in splint wrapped in ACE bandage, right Tibial deformity noted on exam, left lower leg is normal. 1+ pitting edema in left ankle Skin: warm and dry, no rashes noted Neuro: alert and oriented x4, no focal deficits Psych: Normal affect and  mood Laboratory:  Recent Labs Lab 07/09/16 2103 07/10/16 0352 07/11/16 0532  WBC 7.1 8.8  8.6 8.2  HGB 9.8* 9.1*  9.0* 9.4*  HCT 35.0* 32.2*  32.1* 32.7*  PLT 321 306  300 324    Recent Labs Lab 07/09/16 2103 07/10/16 0352 07/11/16 0532  NA 141 141 141  K 5.1 4.3 4.1  CL 103 101 98*  CO2 31 34* 33*  BUN 15 17 18   CREATININE 1.40* 1.39*  1.44* 1.36*  CALCIUM 8.6* 8.6* 9.1  PROT  --  6.1* 6.3*  BILITOT  --  0.6 0.3  ALKPHOS  --  150* 146*  ALT  --  40 31  AST  --  33 28  GLUCOSE 91 97 77    Imaging/Diagnostic Tests: Dg Chest 1 View  Result Date: 07/09/2016 CLINICAL DATA:  Fall with leg injury EXAM: CHEST 1 VIEW COMPARISON:  05/30/2016 FINDINGS: Mild cardiomegaly with prominent central pulmonary artery and mild central vascular congestion. No acute consolidation. No pleural effusion. No pneumothorax. IMPRESSION: Cardiomegaly with central vascular congestion. Electronically Signed   By: Donavan Foil M.D.   On: 07/09/2016 23:22   Dg Pelvis 1-2 Views  Result Date: 07/09/2016 CLINICAL DATA:  Fall EXAM: PELVIS - 1-2 VIEW COMPARISON:  12/17/2013 FINDINGS: SI joints do not appear widened. The pubic symphysis is intact. The rami are unremarkable. Mild arthritis at the bilateral hips. No fracture or dislocation. IMPRESSION: No acute osseous abnormality Electronically Signed   By: Donavan Foil M.D.   On: 07/09/2016 23:23   Dg Knee 1-2 Views Right  Result Date: 07/09/2016 CLINICAL DATA:  Fall with  injury to the leg EXAM: RIGHT KNEE - 1-2 VIEW COMPARISON:  None. FINDINGS: Fracture through the proximal shaft of the fibula with displacement and overriding. Mild degenerative changes at the patellofemoral compartment and medial joint space. Trace suprapatellar effusion. IMPRESSION: 1. Displaced proximal fibular fracture 2. Trace joint effusion Electronically Signed   By: Donavan Foil M.D.   On: 07/09/2016 23:19   Dg Tibia/fibula Right  Result Date: 07/09/2016 CLINICAL DATA:   Fall EXAM: RIGHT TIBIA AND FIBULA - 2 VIEW COMPARISON:  None. FINDINGS: Acute fracture involving the proximal shaft of the fibula with 1 bone with of medial displacement of the distal fracture fragment and approximately 16 mm of overriding. 1/2 bone with of posterior displacement of distal fibular fracture fragment. Additional acute fracture involving the midshaft of the tibia with just under 1 bone with of lateral displacement of distal fracture fragment. About 1/2 bone with of posterior displacement of distal fracture fragment. IMPRESSION: 1. Acute displaced and overriding proximal fibular fracture 2. Acute displaced mid tibial fracture Electronically Signed   By: Donavan Foil M.D.   On: 07/09/2016 23:21   Dg Ankle 2 Views Right  Result Date: 07/09/2016 CLINICAL DATA:  Fall EXAM: RIGHT ANKLE - 2 VIEW COMPARISON:  None. FINDINGS: There is no evidence of fracture, dislocation, or joint effusion. Limited cross-table lateral positioning. IMPRESSION: No acute osseous abnormality Electronically Signed   By: Donavan Foil M.D.   On: 07/09/2016 23:21   Dg Femur Min 2 Views Right  Result Date: 07/09/2016 CLINICAL DATA:  History of fall, deformity to right tibia with pain in the entire right lower leg EXAM: RIGHT FEMUR 2 VIEWS COMPARISON:  None. FINDINGS: Mild patellofemoral degenerative change. No acute fracture or malalignment. Trace suprapatellar effusion. Limited lateral views of the right femur due to habitus and positioning. IMPRESSION: No definite acute osseous abnormality allowing for habitus and limited positioning Electronically Signed   By: Donavan Foil M.D.   On: 07/09/2016 23:17    Marjie Skiff, MD 07/12/2016, 12:38 AM PGY-1, [place] Intern pager: [phone], text pages welcome

## 2016-07-12 NOTE — Progress Notes (Signed)
Orthopedic Tech Progress Note Patient Details:  Tiffany [name] Feb 10, 1960 [id number]  Ortho Devices Type of Ortho Device: CAM walker Ortho Device/Splint Location: RLE Ortho Device/Splint Interventions: Ordered, Application   Braulio Bosch 07/12/2016, 8:50 PM

## 2016-07-12 NOTE — Anesthesia Postprocedure Evaluation (Signed)
Anesthesia Post Note  Patient: Tiffany [name]  Procedure(s) Performed: Procedure(s) (LRB): INTRAMEDULLARY (IM) NAIL RIGHT TIBIA (Right)     Patient location during evaluation: PACU Anesthesia Type: General Level of consciousness: awake and alert Pain management: pain level controlled Vital Signs Assessment: post-procedure vital signs reviewed and stable Respiratory status: spontaneous breathing, respiratory function stable and patient connected to nasal cannula oxygen Cardiovascular status: blood pressure returned to baseline and stable Postop Assessment: spinal receding Anesthetic complications: no    Last Vitals:  Vitals:   07/12/16 1735 07/12/16 1750  BP: 128/82 119/73  Pulse: 82 98  Resp: 14 20  Temp:      Last Pain:  Vitals:   07/12/16 1620  TempSrc:   PainSc: Asleep    LLE Motor Response: Purposeful movement;Responds to commands (07/12/16 1750) LLE Sensation: No numbness;No pain;No tingling (07/12/16 1750) RLE Motor Response: Purposeful movement;Responds to commands (07/12/16 1750) RLE Sensation: No numbness;No pain;No tingling (07/12/16 1750) L Sensory Level: S1-Sole of foot, small toes (07/12/16 1750) R Sensory Level: S1-Sole of foot, small toes (07/12/16 1750)  [name],W. EDMOND

## 2016-07-12 NOTE — Anesthesia Procedure Notes (Signed)
Spinal  Patient location during procedure: OR Start time: 07/12/2016 2:25 PM End time: 07/12/2016 2:35 PM Staffing Anesthesiologist: Adele Barthel P Performed: anesthesiologist  Preanesthetic Checklist Completed: patient identified, surgical consent, pre-op evaluation, timeout performed, IV checked, risks and benefits discussed and monitors and equipment checked Spinal Block Patient position: sitting Prep: DuraPrep Patient monitoring: cardiac monitor, continuous pulse ox and blood pressure Approach: midline Location: L4-5 Injection technique: single-shot Needle Needle type: Quincke  Needle gauge: 22 G Needle length: 12.7 cm Assessment Sensory level: T10 Additional Notes Functioning IV was confirmed and monitors were applied. Sterile prep and drape, including hand hygiene and sterile gloves were used. The patient was positioned and the spine was prepped. The skin was anesthetized with lidocaine.  Free flow of clear CSF was obtained prior to injecting local anesthetic into the CSF.  The spinal needle aspirated freely following injection.  The needle was carefully withdrawn.  The patient tolerated the procedure well.

## 2016-07-12 NOTE — Addendum Note (Signed)
Addendum  created 07/12/16 1803 by Roderic Palau, MD   Order list changed, Order sets accessed

## 2016-07-12 NOTE — Telephone Encounter (Signed)
As the patient seemed very nervous and scared about her upcoming procedure today, I went by to visit her in the hospital.  She was still very nervous but noted my visit "bought a smile to her face."  She still notes significant pain, but medication helps some. Her procedure is schedule for 2 today per her report.  I appreciate all the work of the inpatient team, pulmonology, and orthopedics in caring for my patient.  Archie Patten, MD [facility] Family Medicine Resident  07/12/2016, 12:07 PM

## 2016-07-12 NOTE — Progress Notes (Signed)
This note also relates to the following rows which could not be included: Pulse Rate - Cannot attach notes to unvalidated device data Resp - Cannot attach notes to unvalidated device data  Pt placed on BiPAP. Pt is tolerating well and resting.

## 2016-07-12 NOTE — Op Note (Signed)
Date of Surgery: 07/12/2016  INDICATIONS: Tiffany [name] is a 56 y.o.-year-old female who sustained a right tibia fracture. The risks and benefits of the procedure discussed with the patient prior to the procedure and all questions were answered; consent was obtained.  PREOPERATIVE DIAGNOSIS:  right tibia fracture  POSTOPERATIVE DIAGNOSIS: Same  PROCEDURE:   1. right tibia closed reduction and intramedullary nailing CPT: 25366  2. closed treatment of left fibular shaft fracture with manipulation, CPT - 44034  SURGEON: N. Eduard Roux, M.D.  ASSISTANT: Ky Barban, RNFA .  ANESTHESIA:  general  IV FLUIDS AND URINE: See anesthesia record.  ESTIMATED BLOOD LOSS: minimal mL.  IMPLANTS: Stryker 9 x 330   DRAINS: None.  COMPLICATIONS: None.  DESCRIPTION OF PROCEDURE: The patient was brought to the operating room and placed supine on the operating table.  The patient's leg had been signed prior to the procedure.  The patient had the anesthesia placed by the anesthesiologist.  The prep verification and incision time-outs were performed to confirm that this was the correct patient, site, side and location. The patient had an SCD on the opposite lower extremity. The patient did receive antibiotics prior to the incision and was re-dosed during the procedure as needed at indicated intervals.  The patient had the lower extremity prepped and draped in the standard surgical fashion.  The incision was first made over the quadriceps tendon in the midline and taken down to the skin and subcutaneous tissue to expose the peritenon. The peritenon was incised in line with the skin incision and then a poke hole was made in the quadriceps tendon in the midline. A knife was then used to longitudinally divide the tendon in line with its fibers, taking care not to cross over any fibers. The guide wire was placed at the proximal, anterior tibia, confirming its location on both AP and lateral views. The wire was  drilled into the bone and then the opening reamer was placed over this and maneuvered so that the reamer was parallel with anterior cortex of the tibia. The ball-tipped guide wire was then placed down into the canal towards the fracture site. The fracture was reduced and the wire was passed and confirmed to be in the proper location on both AP and lateral views.  The measuring stick was used to measure the length of the nail.  Sequential reaming was then performed, then the nail was gently hammered into place over the guide wire and the guide wire was removed. The proximal screws were placed through the interlocking drill guide using the sleeve. The distal screws were placed using the perfect circles technique. All screws were placed in the standard fashion, first incising the skin and then spreading with a tonsil, then drilling, measuring with a depth gauge, and then placing the screws by hand. The final x-rays were taken in both AP and lateral views to confirm the fracture reduction as well as the placement of all hardware. The fibula fracture was treated in a closed manner.  The wounds were copiously irrigated with saline and then the peritenon was closed with 0 Vicryl figure-of-eight interrupted sutures. 2.0 vicryl was used to close the subcutaneous layer.  Staples were then used to close all of the open incision wounds.  The wounds were cleaned and dried a final time and a sterile dressing was placed. The patient was then placed in a short leg splint in neutral ankle dorsiflexion. The patient's calf was soft to palpation at the end of  the case.  The patient was then transferred to a bed and taken to the recovery room in stable condition.  All counts were correct at the end of the case.  POSTOPERATIVE PLAN: Tiffany [name] will be NWB and will return 2 weeks for suture removal.  Tiffany [name] will receive DVT prophylaxis.  Azucena Cecil, MD [place] 3:55 PM

## 2016-07-12 NOTE — Progress Notes (Signed)
PT Cancellation Note  Patient Details Name: KAVERI PERRAS MRN: [id number] DOB: 1960/01/30   Cancelled Treatment:    Reason Eval/Treat Not Completed: Patient not medically ready. Pt still awaiting medical clearance for surgery. Acute PT to await orders post surgery when patient has surgery.  Bret Stamour M Arietta Eisenstein 07/12/2016, 6:57 AM   Kittie Plater, PT, DPT Pager #: [phone] Office #: [phone]

## 2016-07-12 NOTE — Progress Notes (Signed)
Ms. Orchard is my clinic patient. Over the last month she's been seeing me almost weekly (as she had been previously hospitalized multiple times). Unfortunately she had a fall leading to tibia/fibular fractures.    As the patient seemed very nervous and scared about her upcoming procedure today when I spoke to her on the phone yesterday, I went by to visit her in the hospital.  She was still very nervous but noted my visit "bought a smile to her face."  She still notes significant pain, but medication helps some. Her procedure is schedule for 2pm today per her report.  Of note: her renal function tends to fluctuate. She has diffuse joint pain and states the only thing that works is Naproxen. We've discussed the risks and benefits of using this. She states she cannot function without Naproxen. Tylenol and tramadol do not work. There is a palliative care consult pending in the outpatient setting for her (given breathing issues and chronic pain).  I appreciate all the work of the inpatient team, pulmonology, and orthopedics in caring for my patient.  Archie Patten, MD [facility] Family Medicine Resident  07/12/2016, 12:07 PM

## 2016-07-12 NOTE — Anesthesia Procedure Notes (Signed)
Procedure Name: MAC Date/Time: 07/12/2016 2:40 PM Performed by: Kyung Rudd Pre-anesthesia Checklist: Patient identified, Emergency Drugs available, Suction available and Patient being monitored Patient Re-evaluated:Patient Re-evaluated prior to inductionOxygen Delivery Method: Simple face mask Intubation Type: IV induction Placement Confirmation: positive ETCO2

## 2016-07-12 NOTE — Transfer of Care (Signed)
Immediate Anesthesia Transfer of Care Note  Patient: Tiffany [name]  Procedure(s) Performed: Procedure(s): INTRAMEDULLARY (IM) NAIL RIGHT TIBIA (Right)  Patient Location: PACU  Anesthesia Type:MAC and Spinal  Level of Consciousness: awake, alert  and oriented  Airway & Oxygen Therapy: Patient Spontanous Breathing and Patient connected to face mask oxygen  Post-op Assessment: Report given to RN and Post -op Vital signs reviewed and stable  Post vital signs: Reviewed and stable  Last Vitals:  Vitals:   07/11/16 2038 07/12/16 0630  BP: 138/85 139/77  Pulse: 80 93  Resp: 18 18  Temp: 37 C 36.9 C    Last Pain:  Vitals:   07/12/16 0630  TempSrc: Oral  PainSc:          Complications: No apparent anesthesia complications

## 2016-07-12 NOTE — Anesthesia Preprocedure Evaluation (Addendum)
Anesthesia Evaluation  Patient identified by MRN, date of birth, ID band Patient awake    Reviewed: Allergy & Precautions, NPO status , Patient's Chart, lab work & pertinent test results  Airway Mallampati: III  TM Distance: >3 FB Neck ROM: Full    Dental no notable dental hx. (+) Poor Dentition, Missing   Pulmonary asthma , sleep apnea, Continuous Positive Airway Pressure Ventilation and Oxygen sleep apnea , former smoker,  Emphysema   Pulmonary exam normal breath sounds clear to auscultation       Cardiovascular hypertension, Pt. on medications + CAD, + Past MI and +CHF  Normal cardiovascular exam Rhythm:Regular Rate:Normal  ECG: SR, rate 79  ECHO:  Left ventricle: Global longitudinal LV strain is normal at -19%   The cavity size was mildly dilated. There was moderate concentric   hypertrophy. Systolic function was normal. The estimated ejection fraction was in the range of 60% to 65%. Wall motion was normal;   there were no regional wall motion abnormalities. Features are consistent with a pseudonormal left ventricular filling pattern, with concomitant abnormal relaxation and increased filling pressure (grade 2 diastolic dysfunction). - Left atrium: The atrium was mildly dilated. - Pulmonic valve: There was trivial regurgitation. - Pulmonary arteries: Systolic pressure could not be accurately estimated.   Neuro/Psych Seizures -,  PSYCHIATRIC DISORDERS Anxiety Depression Bipolar Disorder Schizophrenia CVA    GI/Hepatic Neg liver ROS, GERD  Medicated,  Endo/Other  diabetes, Oral Hypoglycemic Agents  Renal/GU Renal InsufficiencyRenal disease  negative genitourinary   Musculoskeletal  (+) Arthritis , Osteoarthritis,    Abdominal   Peds negative pediatric ROS (+)  Hematology  (+) anemia ,   Anesthesia Other Findings Super obese, BMI 51 Hyperlipidemia   Reproductive/Obstetrics negative OB ROS                            Anesthesia Physical Anesthesia Plan  ASA: IV  Anesthesia Plan: Spinal   Post-op Pain Management:    Induction: Intravenous  PONV Risk Score and Plan: 2 and Ondansetron and Propofol  Airway Management Planned:   Additional Equipment:   Intra-op Plan:   Post-operative Plan:   Informed Consent: I have reviewed the patients History and Physical, chart, labs and discussed the procedure including the risks, benefits and alternatives for the proposed anesthesia with the patient or authorized representative who has indicated his/her understanding and acceptance.   Dental advisory given  Plan Discussed with: CRNA  Anesthesia Plan Comments:    Anesthesia Quick Evaluation

## 2016-07-12 NOTE — Progress Notes (Signed)
Patient stated that she is not ready to use the CPAP machine. The NIV machine was set up per RRT. Instructions giving to the patient on the proper use of the machine. Pt demonstrated and verbalize understanding. Pt is stable at this time no complications noted.

## 2016-07-13 ENCOUNTER — Ambulatory Visit: Payer: Medicare HMO | Admitting: Cardiology

## 2016-07-13 ENCOUNTER — Encounter (HOSPITAL_COMMUNITY): Payer: Self-pay | Admitting: Orthopaedic Surgery

## 2016-07-13 ENCOUNTER — Ambulatory Visit: Payer: Medicare HMO | Admitting: Family Medicine

## 2016-07-13 ENCOUNTER — Ambulatory Visit (HOSPITAL_COMMUNITY): Payer: Self-pay | Admitting: Clinical

## 2016-07-13 LAB — GLUCOSE, CAPILLARY
Glucose-Capillary: 96 mg/dL (ref 65–99)
Glucose-Capillary: 109 mg/dL — ABNORMAL HIGH (ref 65–99)
Glucose-Capillary: 124 mg/dL — ABNORMAL HIGH (ref 65–99)
Glucose-Capillary: 111 mg/dL — ABNORMAL HIGH (ref 65–99)

## 2016-07-13 LAB — BASIC METABOLIC PANEL
Anion gap: 12 (ref 5–15)
BUN: 17 mg/dL (ref 6–20)
CO2: 31 mmol/L (ref 22–32)
Calcium: 8.9 mg/dL (ref 8.9–10.3)
Chloride: 97 mmol/L — ABNORMAL LOW (ref 101–111)
Creatinine, Ser: 1.2 mg/dL — ABNORMAL HIGH (ref 0.44–1.00)
GFR calc Af Amer: 57 mL/min — ABNORMAL LOW (ref >60)
GFR calc non Af Amer: 49 mL/min — ABNORMAL LOW (ref >60)
Glucose, Bld: 133 mg/dL — ABNORMAL HIGH (ref 65–99)
Potassium: 4.1 mmol/L (ref 3.5–5.1)
Sodium: 140 mmol/L (ref 135–145)

## 2016-07-13 LAB — CBC
HCT: 32.4 % — ABNORMAL LOW (ref 36.0–46.0)
Hemoglobin: 9.5 g/dL — ABNORMAL LOW (ref 12.0–15.0)
MCH: 28.6 pg (ref 26.0–34.0)
MCHC: 29.3 g/dL — ABNORMAL LOW (ref 30.0–36.0)
MCV: 97.6 fL (ref 78.0–100.0)
Platelets: 292 10*3/uL (ref 150–400)
RBC: 3.32 MIL/uL — ABNORMAL LOW (ref 3.87–5.11)
RDW: 15.8 % — ABNORMAL HIGH (ref 11.5–15.5)
WBC: 12.6 10*3/uL — ABNORMAL HIGH (ref 4.0–10.5)

## 2016-07-13 NOTE — Progress Notes (Signed)
Family Medicine Teaching Service Daily Progress Note Intern Pager: [phone]  Patient name: Tiffany [name] Medical record number: [id number] Date of birth: 1960/09/02 Age: 56 y.o. Gender: female  Primary Care Provider: Archie Patten, MD Consultants: Orthopedic Surgery Code Status: Full  Assessment and Plan: [name] is a 56 y.o. female with a past medical history significant for CKDII, T2DM, HFpEF, Asthma/COPD, HLD, HTN, Bipolar 1 disorder, paranoid schizophrenia who presented today with right lower extremity pain and deformity and found to have a displaced proximal fibular fracture.   #Right displaced tibia/fibula fracture s/p closed reduction and intramedullary  Patient did well post surgery, pain is well controlled. Orthopedic surgery recommend NWB and will return in 2 weeks for suture removal. Patient will be send home with CAM walker. --Follow up on Ortho consult, appreciate recs --Follow up on am CBC and CMP --Discontinue fentanyl 25 mcg q2 prn for moderate pain --Continue Hydrocodone-acetaminophen 5-325 mg q6 prn --Continue oxycodone IR 5-10 mg q4 prn --Acetaminophen 650 mg q6 prn  #Asthma/COPD, chronic respiratory failure, stable Patient is currently on 8L [place], was tried on BiPAP overnight and did well. Increase oxygen requirement likely secondary to volume overload. Patient had diffused crackles on exam this morning. Will require sleep study on discharge to assess for likely OSA. --Nocturnal Bipap post surgery --Consider CPAP on discharge until sleep study perform --Duoneb q6 prn  --Continue Breo Ellipta (Fluticasone furoate-vilanterol) 1 puff daily --Continue Singulair 10 mg qhs  --Continue Tiotropium 18 mcg daily   #HFpEF, Chronic Last ECHO on 3/20 showed EF 60-65% with G2DD with normal wall motion. Patient is volume overloaded with crackles noted on exam and increase work of breathing. Patient is normally on 80 mg torsemide but dose was reduced on admission due  to AKI. --Increase Torsemide to 80 mg bid --Monitor I/O --Daily weights --Follow up on daily BMP --Continue aspirin 81 mg daily  #Acute on Chronic Kidney Dysfunction, chronic, resolving On admission creatinine was 1.4. Baseline appears to be around 1.1-1.2. This am creatinine was 1.20 --Follow up on daily BMP --Continue to hold Naproxen, Oxybutynin and Metformin --Avoid nephrotoxic agents  #Cholecystostomy, chronic Drain was exchanged yesterday by IR and patient tolerated the procedure. Patient had good output.    --Will continue to monitor  #Bipolar 1 disorder/Paranoid schizophrenia, chronic, stable Patient is followed by Dr. Berniece Andreas, previously at [facility]. Patient's last hospitalization was in Nov 2017 at [facility]. Last visit with psychiatrist on 6/14. Patient  Is on a extensive drug regimen that seem to be working for her. [name] level slightly sub therapeutic --Will continue divalproex 1000mg  daily --Will continue escitalopram 20 mg daily --Will continue Mirtazapine 45 mg qhs --Will continue Ziprasidone 80 mg qhs --Will reduce Cogentin to 0.5 mg qhs  Per psych note  #Bilateral Knee pain, chronic Patient with history of bilateral knee pain likely secondary to osteoarthritis in the setting of morbid obesity. --Continue to hold naproxen, CKD II and worsening creatinine --Continue to hold Gabapentin 600 mg tid daily, could reduce dose in setting of falls.  #Hypertension  On admission BP 121/72, with max of 150/95 likely secondary to pain. Patient has taken medications today. Will continue current home regimen given CKDII, hold if hypotensive. --Continue amlodipine 5 mg daily  #Hyperlipidemia Patient with HTN and HFpEF as well as T2DM though last A1c was 5.6. ASCVD based on last lipid panel (12/04/2015) is 7.6%. --Will continue atorvastatin 40 mg daily  #Urinary incontinence, Patient with a history of overactive bladder and  urinary incontinence was  started on oxybutynin by urology but asked to discontinue during previous hospitalizations . Given recent falls and polypharmacy with multiple meds with anticholinergic effect, will likely discontinue oxybutynin and start patient on [place]  --Will Hold oxybutynin in the setting of AKI --Consider starting patient on [place]  #T2DM Patient with a diagnosis of T2DM on metformin, however last A1c was 5.6. On Admission , BG was within normal limit. --Will hold metformin in the setting of AKI --Sensitive SSI --Monitor CBG qac/hs  #Obesity Followed by Dr.[facility] nutritionist). Patient has been trying to make changes to her diet and exercise plan for better weight control. --Will defer to PCP and outpatient nutritionist  #GERD --Protonix 40 mg daily  FEN/GI: Heart Healthy/ Carb modified Diet PPx: Lovenox  Disposition: SNF vs CIR vs Home health pending medical stabilization and likely surgical intervention  Subjective:  Patient reports shortness of breath this morning, and pain in her leg. Patient is asking for increase diuresis this morning to help her with her breathing.  Objective: Temp:  [97.9 F (36.6 C)-98.6 F (37 C)] 98.4 F (36.9 C) (06/28 0404) Pulse Rate:  [75-98] 89 (06/28 0404) Resp:  [14-23] 15 (06/28 0404) BP: (113-169)/(69-100) 164/93 (06/28 0404) SpO2:  [85 %-100 %] 95 % (06/28 0404) FiO2 (%):  [60 %] 60 % (06/27 2001) Weight:  [303 lb 9.2 oz (137.7 kg)] 303 lb 9.2 oz (137.7 kg) (06/28 0404)  Physical Exam: General: Obese woman, in no acute distress, able to participate in exam and answer questions, coherent  Cardiac: RRR, normal heart sounds, no murmurs. 2+ radial and PT pulses bilaterally Respiratory: Mild wheezes noted on exam bilaterally upper and lower lung field, normal effort, rno crackles Abdomen: soft, nontender, nondistended, no hepatic or splenomegaly, +BS, cholecystotomy bag in place with bilious drainage in bag, no area of erythema  noted Extremities: Right lower leg in splint wrapped in ACE bandage,left lower leg is normal. Edema noted on left leg. Skin: warm and dry, no rashes noted Neuro: alert and oriented x4, no focal deficits Psych: Normal affect and mood  Laboratory:  Recent Labs Lab 07/11/16 0532 07/12/16 0433 07/13/16 0205  WBC 8.2 8.8 12.6*  HGB 9.4* 9.2* 9.5*  HCT 32.7* 31.5* 32.4*  PLT 324 338 292    Recent Labs Lab 07/10/16 0352 07/11/16 0532 07/12/16 0433 07/13/16 0205  NA 141 141 141 140  K 4.3 4.1 3.5 4.1  CL 101 98* 97* 97*  CO2 34* 33* 36* 31  BUN 17 18 18 17   CREATININE 1.39*  1.44* 1.36* 1.48* 1.20*  CALCIUM 8.6* 9.1 9.1 8.9  PROT 6.1* 6.3*  --   --   BILITOT 0.6 0.3  --   --   ALKPHOS 150* 146*  --   --   ALT 40 31  --   --   AST 33 28  --   --   GLUCOSE 97 77 112* 133*    Imaging/Diagnostic Tests: Dg Chest 1 View  Result Date: 07/09/2016 CLINICAL DATA:  Fall with leg injury EXAM: CHEST 1 VIEW COMPARISON:  05/30/2016 FINDINGS: Mild cardiomegaly with prominent central pulmonary artery and mild central vascular congestion. No acute consolidation. No pleural effusion. No pneumothorax. IMPRESSION: Cardiomegaly with central vascular congestion. Electronically Signed   By: Donavan Foil M.D.   On: 07/09/2016 23:22   Dg Pelvis 1-2 Views  Result Date: 07/09/2016 CLINICAL DATA:  Fall EXAM: PELVIS - 1-2 VIEW COMPARISON:  12/17/2013 FINDINGS: SI joints do not appear  widened. The pubic symphysis is intact. The rami are unremarkable. Mild arthritis at the bilateral hips. No fracture or dislocation. IMPRESSION: No acute osseous abnormality Electronically Signed   By: Donavan Foil M.D.   On: 07/09/2016 23:23   Dg Knee 1-2 Views Right  Result Date: 07/09/2016 CLINICAL DATA:  Fall with injury to the leg EXAM: RIGHT KNEE - 1-2 VIEW COMPARISON:  None. FINDINGS: Fracture through the proximal shaft of the fibula with displacement and overriding. Mild degenerative changes at the patellofemoral  compartment and medial joint space. Trace suprapatellar effusion. IMPRESSION: 1. Displaced proximal fibular fracture 2. Trace joint effusion Electronically Signed   By: Donavan Foil M.D.   On: 07/09/2016 23:19   Dg Tibia/fibula Right  Result Date: 07/12/2016 CLINICAL DATA:  Fracture after a fall. EXAM: DG C-ARM 61-120 MIN; RIGHT TIBIA AND FIBULA - 2 VIEW COMPARISON:  None. FINDINGS: Open reduction internal fixation of the tibial fracture with intramedullary rod. Satisfactory position and alignment. IMPRESSION: As above. Electronically Signed   By: Staci Righter M.D.   On: 07/12/2016 16:06   Dg Tibia/fibula Right  Result Date: 07/09/2016 CLINICAL DATA:  Fall EXAM: RIGHT TIBIA AND FIBULA - 2 VIEW COMPARISON:  None. FINDINGS: Acute fracture involving the proximal shaft of the fibula with 1 bone with of medial displacement of the distal fracture fragment and approximately 16 mm of overriding. 1/2 bone with of posterior displacement of distal fibular fracture fragment. Additional acute fracture involving the midshaft of the tibia with just under 1 bone with of lateral displacement of distal fracture fragment. About 1/2 bone with of posterior displacement of distal fracture fragment. IMPRESSION: 1. Acute displaced and overriding proximal fibular fracture 2. Acute displaced mid tibial fracture Electronically Signed   By: Donavan Foil M.D.   On: 07/09/2016 23:21   Dg Ankle 2 Views Right  Result Date: 07/09/2016 CLINICAL DATA:  Fall EXAM: RIGHT ANKLE - 2 VIEW COMPARISON:  None. FINDINGS: There is no evidence of fracture, dislocation, or joint effusion. Limited cross-table lateral positioning. IMPRESSION: No acute osseous abnormality Electronically Signed   By: Donavan Foil M.D.   On: 07/09/2016 23:21   Ir Sinus/fist Tube Chk-non Gi  Result Date: 07/12/2016 INDICATION: Cholecystostomy tube check EXAM: SINUS TRACT INJECTION / FISTULOGRAM COMPARISON:  None. MEDICATIONS: The patient is currently admitted to  the hospital and receiving intravenous antibiotics. The antibiotics were administered within an appropriate time frame prior to the initiation of the procedure. ANESTHESIA/SEDATION: None COMPLICATIONS: None immediate. TECHNIQUE: Informed written consent was obtained from the patient after a thorough discussion of the procedural risks, benefits and alternatives. All questions were addressed. Maximal Sterile Barrier Technique was utilized including caps, mask, sterile gowns, sterile gloves, sterile drape, hand hygiene and skin antiseptic. A timeout was performed prior to the initiation of the procedure. Contrast was injected into the cholecystostomy tube and imaging was obtained. PROCEDURE: The cholecystostomy tube is well positioned within the lumen of the gallbladder. Several gallstones are noted. The cystic duct partially opacified. IMPRESSION: The cholecystostomy tube remains well positioned in the gallbladder. It is functioning adequately. Cholelithiasis. Electronically Signed   By: Marybelle Killings M.D.   On: 07/12/2016 11:18   Dg Tibia/fibula Right Port  Result Date: 07/12/2016 CLINICAL DATA:  Postop fracture fixation. EXAM: PORTABLE RIGHT TIBIA AND FIBULA - 2 VIEW COMPARISON:  07/09/2016 FINDINGS: Interval fixation of the mid tibia fracture with a intramedullary rod along with 2 proximal and 2 distal interlocking screws. Good position and alignment without complicating features. There  is also improved position and alignment of the proximal fibular shaft fracture. One half shaft width of medial displacement. The knee and ankle joints are maintained. IMPRESSION: Internal fixation of mid tibia fracture with an intramedullary rod. Anatomic alignment without complicating features. Improved position and alignment of the proximal fibular shaft fracture. Electronically Signed   By: Marijo Sanes M.D.   On: 07/12/2016 17:05   Dg C-arm 1-60 Min  Result Date: 07/12/2016 CLINICAL DATA:  Fracture after a fall. EXAM: DG  C-ARM 61-120 MIN; RIGHT TIBIA AND FIBULA - 2 VIEW COMPARISON:  None. FINDINGS: Open reduction internal fixation of the tibial fracture with intramedullary rod. Satisfactory position and alignment. IMPRESSION: As above. Electronically Signed   By: Staci Righter M.D.   On: 07/12/2016 16:06   Dg Femur Min 2 Views Right  Result Date: 07/09/2016 CLINICAL DATA:  History of fall, deformity to right tibia with pain in the entire right lower leg EXAM: RIGHT FEMUR 2 VIEWS COMPARISON:  None. FINDINGS: Mild patellofemoral degenerative change. No acute fracture or malalignment. Trace suprapatellar effusion. Limited lateral views of the right femur due to habitus and positioning. IMPRESSION: No definite acute osseous abnormality allowing for habitus and limited positioning Electronically Signed   By: Donavan Foil M.D.   On: 07/09/2016 23:17    Marjie Skiff, MD 07/13/2016, 7:04 AM PGY-1, [place] Intern pager: [phone], text pages welcome

## 2016-07-13 NOTE — Evaluation (Signed)
Physical Therapy Evaluation Patient Details Name: RAVIN BENDALL MRN: [id number] DOB: 09-30-1960 Today's Date: 07/13/2016   History of Present Illness  56 Y/O F with PMX of Morbid obesity, DM2, CVA, Seizure,Paranoid, OSA on CPAP,manic depression, HTN,HLD, COPD,CHF, CKD, Cocaine abuse presented after a fall sustaining R tibia fracture. Now pt s/p R tibia closed reduction and IM nail on 6/27.  Clinical Impression  Pt admitted with above diagnosis. Pt currently with functional limitations due to the deficits listed below (see PT Problem List). Pt needed +2 total to max assist to pivot to chair. Difficulty with NWB right LE.  Will need SNF.   Pt will benefit from skilled PT to increase their independence and safety with mobility to allow discharge to the venue listed below.      Follow Up Recommendations SNF;Supervision/Assistance - 24 hour    Equipment Recommendations  Other (comment) (TBA)    Recommendations for Other Services       Precautions / Restrictions Precautions Precautions: Fall Precaution Comments: watch O2 sats, cholecystostomy  Required Braces or Orthoses: Other Brace/Splint Other Brace/Splint: R CAM boot Restrictions Weight Bearing Restrictions: Yes RLE Weight Bearing: Non weight bearing      Mobility  Bed Mobility Overal bed mobility: Needs Assistance Bed Mobility: Supine to Sit     Supine to sit: Max assist;+2 for physical assistance;HOB elevated     General bed mobility comments: Assist for LEs to EOB, trunk elevation to sitting, and scooting hips out to EOB.  Transfers Overall transfer level: Needs assistance Equipment used: Rolling walker (2 wheeled);2 person hand held assist Transfers: Sit to/from [facility] Sit to Stand: Max assist;+2 physical assistance Stand pivot transfers: Max assist;+2 physical assistance       General transfer comment: Cues for hand placement and technique. Assist to hold RLE off ground to maintain NWB. Pt  able to perform sit to stand x1 with RW, required 2 person face to face assist for stand pivot transfer to pts left side with use of pad to asssist.   Ambulation/Gait             General Gait Details: unable  Stairs            Wheelchair Mobility    Modified Rankin (Stroke Patients Only)       Balance Overall balance assessment: Needs assistance Sitting-balance support: Feet supported;Bilateral upper extremity supported Sitting balance-Leahy Scale: Fair     Standing balance support: Bilateral upper extremity supported Standing balance-Leahy Scale: Zero Standing balance comment: could not stand to RW without putting too much weight on right LE.                              Pertinent Vitals/Pain Pain Assessment: Faces Faces Pain Scale: Hurts even more Pain Location: RLE Pain Descriptors / Indicators: Crying;Grimacing Pain Intervention(s): Limited activity within patient's tolerance;Monitored during session;Repositioned;Patient requesting pain meds-RN notified    Home Living Family/patient expects to be discharged to:: Private residence Living Arrangements: Children Available Help at Discharge: Family;Available 24 hours/day (son) Type of Home: House Home Access: Ramped entrance     Home Layout: One level Home Equipment: Grab bars - tub/shower;Cane - single point;Walker - 2 wheels;Bedside commode;Walker - 4 wheels Additional Comments: home O2-6L    Prior Function Level of Independence: Independent with assistive device(s)         Comments: cane vs rollator for mobility recently, drives, occasional assist with LB ADL  Hand Dominance   Dominant Hand: Right    Extremity/Trunk Assessment   Upper Extremity Assessment Upper Extremity Assessment: Defer to OT evaluation    Lower Extremity Assessment Lower Extremity Assessment: RLE deficits/detail;LLE deficits/detail RLE Deficits / Details: grossly 2-//5 LLE Deficits / Details: grossly  3/5    Cervical / Trunk Assessment Cervical / Trunk Assessment: Other exceptions Cervical / Trunk Exceptions: obesity  Communication   Communication: No difficulties  Cognition Arousal/Alertness: Awake/alert Behavior During Therapy: Anxious;WFL for tasks assessed/performed Overall Cognitive Status: Within Functional Limits for tasks assessed                                        General Comments General comments (skin integrity, edema, etc.): O2 on 8LHFNC >88% during treatment.     Exercises     Assessment/Plan    PT Assessment Patient needs continued PT services  PT Problem List Decreased activity tolerance;Decreased balance;Decreased strength;Decreased mobility;Decreased knowledge of use of DME;Decreased safety awareness;Decreased knowledge of precautions;Pain       PT Treatment Interventions DME instruction;Gait training;Functional mobility training;Therapeutic exercise;Therapeutic activities;Balance training;Patient/family education    PT Goals (Current goals can be found in the Care Plan section)  Acute Rehab PT Goals Patient Stated Goal: rehab before home PT Goal Formulation: With patient Time For Goal Achievement: 07/27/16 Potential to Achieve Goals: Good    Frequency Min 3X/week   Barriers to discharge Decreased caregiver support      Co-evaluation PT/OT/SLP Co-Evaluation/Treatment: Yes Reason for Co-Treatment: Complexity of the patient's impairments (multi-system involvement) PT goals addressed during session: Mobility/safety with mobility OT goals addressed during session: ADL's and self-care       AM-PAC PT "6 Clicks" Daily Activity  Outcome Measure Difficulty turning over in bed (including adjusting bedclothes, sheets and blankets)?: Total Difficulty moving from lying on back to sitting on the side of the bed? : Total Difficulty sitting down on and standing up from a chair with arms (e.g., wheelchair, bedside commode, etc,.)?:  Total Help needed moving to and from a bed to chair (including a wheelchair)?: Total Help needed walking in hospital room?: Total Help needed climbing 3-5 steps with a railing? : Total 6 Click Score: 6    End of Session Equipment Utilized During Treatment: Gait belt;Oxygen Activity Tolerance: Patient limited by fatigue;Patient limited by pain Patient left: in chair;with call bell/phone within reach Nurse Communication: Mobility status;Need for lift equipment PT Visit Diagnosis: Unsteadiness on feet (R26.81);Pain;History of falling (Z91.81);Muscle weakness (generalized) (M62.81) Pain - Right/Left: Right Pain - part of body: Leg;Ankle and joints of foot    Time: 7824-2353 PT Time Calculation (min) (ACUTE ONLY): 33 min   Charges:   PT Evaluation $PT Eval Moderate Complexity: 1 Procedure PT Treatments $Therapeutic Activity: 8-22 mins   PT G Codes:        Moshe Wenger,PT Acute Rehabilitation [phone] (pager)   Denice Paradise 07/13/2016, 2:25 PM

## 2016-07-13 NOTE — Evaluation (Signed)
Occupational Therapy Evaluation Patient Details Name: Tiffany [name] MRN: [id number] DOB: 1960-02-09 Today's Date: 07/13/2016    History of Present Illness 56 Y/O F with PMX of Morbid obesity, DM2, CVA, Seizure,Paranoid, OSA on CPAP,manic depression, HTN,HLD, COPD,CHF, CKD, Cocaine abuse presented after a fall sustaining R tibia fracture. Now pt s/p R tibia closed reduction and IM nail on 6/27.   Clinical Impression   Pt reports she was managing ADL with minimal assist from her son PTA. Currently pt requires max assist +2 for basic transfers with physical assist to maintain RLE NWB. Pt requires max assist overall for ADL but able to complete grooming tasks in supported sitting with set up. SpO2 >88% on 8L HFNC during activity. Recommending SNF for follow up to maximize independence and safety with ADL and functional mobility prior to return home. Pt would benefit from continued skilled OT to address established goals.    Follow Up Recommendations  SNF;Supervision/Assistance - 24 hour    Equipment Recommendations  Other (comment) (TBD at next venue)    Recommendations for Other Services       Precautions / Restrictions Precautions Precautions: Fall Precaution Comments: watch O2 sats, cholecystostomy  Required Braces or Orthoses: Other Brace/Splint Other Brace/Splint: R CAM boot Restrictions Weight Bearing Restrictions: Yes RLE Weight Bearing: Non weight bearing      Mobility Bed Mobility Overal bed mobility: Needs Assistance Bed Mobility: Supine to Sit     Supine to sit: Max assist;+2 for physical assistance;HOB elevated     General bed mobility comments: Assist for LEs to EOB, trunk elevation to sitting, and scooting hips out to EOB.  Transfers Overall transfer level: Needs assistance Equipment used: Rolling walker (2 wheeled);2 person hand held assist Transfers: Sit to/from [facility] Sit to Stand: Max assist;+2 physical assistance Stand pivot  transfers: Max assist;+2 physical assistance       General transfer comment: Cues for hand placement and technique. Assist to hold RLE off ground to maintain NWB. Pt able to perform sit to stand x1 with RW, required 2 person face to face assist for stand pivot transfer.    Balance Overall balance assessment: Needs assistance Sitting-balance support: Feet supported;Bilateral upper extremity supported Sitting balance-Leahy Scale: Fair     Standing balance support: Bilateral upper extremity supported Standing balance-Leahy Scale: Zero                             ADL either performed or assessed with clinical judgement   ADL Overall ADL's : Needs assistance/impaired     Grooming: Set up;Oral care;Sitting                   Toilet Transfer: Maximal assistance;+2 for physical assistance;Stand-pivot Toilet Transfer Details (indicate cue type and reason): Simulated by transfer from EOB to chair         Functional mobility during ADLs: Maximal assistance;+2 for physical assistance (for stand pivot only) General ADL Comments: Pt with difficulty maintaining RLE NWB during transfers. Discussed post acute rehab-pt agreeable.     Vision         Perception     Praxis      Pertinent Vitals/Pain Pain Assessment: Faces Faces Pain Scale: Hurts even more Pain Location: RLE Pain Descriptors / Indicators: Crying;Grimacing Pain Intervention(s): Monitored during session;Repositioned     Hand Dominance Right   Extremity/Trunk Assessment Upper Extremity Assessment Upper Extremity Assessment: Generalized weakness   Lower Extremity Assessment Lower Extremity  Assessment: Defer to PT evaluation   Cervical / Trunk Assessment Cervical / Trunk Assessment: Other exceptions Cervical / Trunk Exceptions: obesity   Communication Communication Communication: No difficulties   Cognition Arousal/Alertness: Awake/alert Behavior During Therapy: Anxious;WFL for tasks  assessed/performed Overall Cognitive Status: Within Functional Limits for tasks assessed                                     General Comments  SpO2 >88% on 8L HFNC throughout session.    Exercises     Shoulder Instructions      Home Living Family/patient expects to be discharged to:: Private residence Living Arrangements: Children Available Help at Discharge: Family;Available 24 hours/day Type of Home: House Home Access: Ramped entrance     Home Layout: One level     Bathroom Shower/Tub: Teacher, early years/pre: Standard     Home Equipment: Grab bars - tub/shower;Cane - single point;Walker - 2 wheels;Bedside commode   Additional Comments: home O2-6L      Prior Functioning/Environment Level of Independence: Independent with assistive device(s)        Comments: cane for mobility recently, drives, occasional assist with LB ADL        OT Problem List: Decreased strength;Decreased range of motion;Decreased activity tolerance;Impaired balance (sitting and/or standing);Decreased knowledge of use of DME or AE;Decreased knowledge of precautions;Cardiopulmonary status limiting activity;Obesity;Pain;Increased edema      OT Treatment/Interventions: Self-care/ADL training;Therapeutic exercise;Energy conservation;DME and/or AE instruction;Therapeutic activities;Patient/family education;Balance training    OT Goals(Current goals can be found in the care plan section) Acute Rehab OT Goals Patient Stated Goal: rehab before home OT Goal Formulation: With patient Time For Goal Achievement: 07/27/16 Potential to Achieve Goals: Good  OT Frequency: Min 2X/week   Barriers to D/C:            Co-evaluation PT/OT/SLP Co-Evaluation/Treatment: Yes Reason for Co-Treatment: Complexity of the patient's impairments (multi-system involvement);For patient/therapist safety;To address functional/ADL transfers   OT goals addressed during session: ADL's and  self-care      AM-PAC PT "6 Clicks" Daily Activity     Outcome Measure Help from another person eating meals?: A Little Help from another person taking care of personal grooming?: A Little Help from another person toileting, which includes using toliet, bedpan, or urinal?: Total Help from another person bathing (including washing, rinsing, drying)?: A Lot Help from another person to put on and taking off regular upper body clothing?: A Lot Help from another person to put on and taking off regular lower body clothing?: A Lot 6 Click Score: 13   End of Session Equipment Utilized During Treatment: Gait belt;Rolling walker;Oxygen;Other (comment) (R CAM boot) Nurse Communication: Mobility status;Weight bearing status  Activity Tolerance: Patient tolerated treatment well Patient left: in chair;with call bell/phone within reach;with family/visitor present  OT Visit Diagnosis: Unsteadiness on feet (R26.81);Other abnormalities of gait and mobility (R26.89);History of falling (Z91.81);Muscle weakness (generalized) (M62.81);Pain Pain - Right/Left: Right Pain - part of body: Leg                Time: 2409-7353 OT Time Calculation (min): 34 min Charges:  OT General Charges $OT Visit: 1 Procedure OT Evaluation $OT Eval Moderate Complexity: 1 Procedure G-Codes:     Zahniya Zellars A. Ulice Brilliant, M.S., OTR/L Pager: [place] 07/13/2016, 1:22 PM

## 2016-07-13 NOTE — Progress Notes (Signed)
OT Cancellation Note  Patient Details Name: Tiffany [name] MRN: [id number] DOB: [birth year]   Cancelled Treatment:    Reason Eval/Treat Not Completed: Medical issues which prohibited therapy. Per RN, pt desating on 6L; RT currently evaluating need for pt to go back on bipap. Will hold therapy at this time and follow up as appropriate.  Binnie Kand M.S., OTR/L Pager: [phone]  07/13/2016, 9:00 AM

## 2016-07-14 ENCOUNTER — Encounter (HOSPITAL_COMMUNITY): Payer: Self-pay | Admitting: Radiology

## 2016-07-14 ENCOUNTER — Inpatient Hospital Stay (HOSPITAL_COMMUNITY): Payer: Medicare HMO

## 2016-07-14 DIAGNOSIS — Z9889 Other specified postprocedural states: Secondary | ICD-10-CM

## 2016-07-14 DIAGNOSIS — S82221D Displaced transverse fracture of shaft of right tibia, subsequent encounter for closed fracture with routine healing: Secondary | ICD-10-CM

## 2016-07-14 DIAGNOSIS — R0600 Dyspnea, unspecified: Secondary | ICD-10-CM

## 2016-07-14 LAB — BASIC METABOLIC PANEL
Anion gap: 9 (ref 5–15)
BUN: 17 mg/dL (ref 6–20)
CO2: 35 mmol/L — ABNORMAL HIGH (ref 22–32)
Calcium: 8.7 mg/dL — ABNORMAL LOW (ref 8.9–10.3)
Chloride: 96 mmol/L — ABNORMAL LOW (ref 101–111)
Creatinine, Ser: 1.32 mg/dL — ABNORMAL HIGH (ref 0.44–1.00)
GFR calc Af Amer: 51 mL/min — ABNORMAL LOW (ref >60)
GFR calc non Af Amer: 44 mL/min — ABNORMAL LOW (ref >60)
Glucose, Bld: 132 mg/dL — ABNORMAL HIGH (ref 65–99)
Potassium: 3.2 mmol/L — ABNORMAL LOW (ref 3.5–5.1)
Sodium: 140 mmol/L (ref 135–145)

## 2016-07-14 LAB — GLUCOSE, CAPILLARY
Glucose-Capillary: 100 mg/dL — ABNORMAL HIGH (ref 65–99)
Glucose-Capillary: 113 mg/dL — ABNORMAL HIGH (ref 65–99)
Glucose-Capillary: 115 mg/dL — ABNORMAL HIGH (ref 65–99)
Glucose-Capillary: 117 mg/dL — ABNORMAL HIGH (ref 65–99)
Glucose-Capillary: 146 mg/dL — ABNORMAL HIGH (ref 65–99)

## 2016-07-14 LAB — CBC
HCT: 30.2 % — ABNORMAL LOW (ref 36.0–46.0)
Hemoglobin: 8.6 g/dL — ABNORMAL LOW (ref 12.0–15.0)
MCH: 27.6 pg (ref 26.0–34.0)
MCHC: 28.5 g/dL — ABNORMAL LOW (ref 30.0–36.0)
MCV: 96.8 fL (ref 78.0–100.0)
Platelets: 313 10*3/uL (ref 150–400)
RBC: 3.12 MIL/uL — ABNORMAL LOW (ref 3.87–5.11)
RDW: 15.7 % — ABNORMAL HIGH (ref 11.5–15.5)
WBC: 10.5 10*3/uL (ref 4.0–10.5)

## 2016-07-14 LAB — D-DIMER, QUANTITATIVE: D-Dimer, Quant: 2.02 ug/mL-FEU — ABNORMAL HIGH (ref 0.00–0.50)

## 2016-07-14 MED ORDER — IOPAMIDOL (ISOVUE-370) INJECTION 76%
INTRAVENOUS | Status: AC
  Administered 2016-07-14: 80 mL
  Filled 2016-07-14: qty 100

## 2016-07-14 MED ORDER — NALOXONE HCL 0.4 MG/ML IJ SOLN
INTRAMUSCULAR | Status: AC
  Filled 2016-07-14: qty 1

## 2016-07-14 MED ORDER — NALOXONE HCL 0.4 MG/ML IJ SOLN
0.4000 mg | INTRAMUSCULAR | Status: DC | PRN
  Administered 2016-07-14: 0.4 mg via INTRAVENOUS

## 2016-07-14 MED ORDER — POTASSIUM CHLORIDE CRYS ER 20 MEQ PO TBCR
40.0000 meq | EXTENDED_RELEASE_TABLET | Freq: Two times a day (BID) | ORAL | Status: AC
  Administered 2016-07-14 (×2): 40 meq via ORAL
  Filled 2016-07-14 (×2): qty 2

## 2016-07-14 NOTE — Progress Notes (Signed)
   Subjective:  Patient reports pain as mild.  Increased oxygen requirement overnight.  Objective:   VITALS:   Vitals:   07/14/16 0500 07/14/16 0600 07/14/16 0752 07/14/16 1159  BP:   131/79   Pulse:   83   Resp:   16   Temp:  99.7 F (37.6 C) 98.7 F (37.1 C) 99 F (37.2 C)  TempSrc:  Axillary Oral Axillary  SpO2:   94%   Weight: (!) 306 lb 10.6 oz (139.1 kg)     Height:        Neurologically intact Neurovascular intact Sensation intact distally Intact pulses distally Dorsiflexion/Plantar flexion intact Incision: dressing C/D/I and no drainage No cellulitis present Compartment soft   Lab Results  Component Value Date   WBC 10.5 07/14/2016   HGB 8.6 (L) 07/14/2016   HCT 30.2 (L) 07/14/2016   MCV 96.8 07/14/2016   PLT 313 07/14/2016     Assessment/Plan:  2 Days Post-Op   - Expected postop acute blood loss anemia - will monitor for symptoms - Up with PT/OT - DVT ppx - SCDs, ambulation, lovenox - NWB operative extremity - will need SNF placement  Eduard Roux 07/14/2016, 1:59 PM [phone]

## 2016-07-14 NOTE — Progress Notes (Addendum)
Upon assessment at 0030 patient had decreased responsiveness, only responded to loud stimuli and pain, unable to follow commands. Rapid Response nurse paged. Patient given one dose of narcan. Patient showed improvement in responsiveness after narcan and was able to follow commands and is oriented x4 but is drowsy. FMTS notified and DO Vanetta Shawl, Levada Dy has been to see patient at bedisde. Patient has been placed back on Bipap. Will continue to monitor.    Update 0227: Patient is more responsive and following commands although she is still drowsy. Temp continues to be 101.1 despite prn tylenol. FMTS texted paged, will continue to monitor.

## 2016-07-14 NOTE — Significant Event (Signed)
Rapid Response Event Note  Overview: Called by bedside RN d/t decreased LOC  Time Called: 7711 Arrival Time: 0050 Event Type: Neurologic  Initial Focused Assessment: Pt laying in bed, arouses to painful stimuli, opens eyes, and immediately falls back to sleep.  CBG-115, HR-89, BP-135/93, RR-18, SpO2-98% on 8L HFNC. Pt given 10mg  oxycodone PO at 2110 prior to placing on bipap.   Interventions: Continuous stimulation, narcan 0.4mg  given X 1 with increase in mental status.  Pt alert and oriented post narcan, c/o no pain, moves all extremities.  Plan of Care (if not transferred): Monitor mental status, call RRT if further assistance needed.  Event Summary: Name of Physician Notified:  Delilah Shan, RN to notify MD of event) at      at    Outcome: Stayed in room and stabalized     [place], Carren Rang

## 2016-07-14 NOTE — Care Management Important Message (Signed)
Important Message  Patient Details  Name: Tiffany [name] MRN: [id number] Date of Birth: Mar 08, 1960   Medicare Important Message Given:  Yes    Nathen May 07/14/2016, 10:48 AM

## 2016-07-14 NOTE — Clinical Social Work Note (Signed)
[name] following for medical readiness to begin SNF search. Rapid called early this morning due to unresponsiveness. Oxygen has been increased from 6L HFNC to 8 L HFNC.  Tiffany [name], Tiffany [name]

## 2016-07-14 NOTE — Progress Notes (Signed)
   Notified by RN that patient had decreased responsiveness and RR was called. Patient was given narcan with improvement. Went to evaluated patient. She awoke easily and could answer questions and follow commands. Appeared drowsy. VSS. No focal neuro deficits. She denied pain or trouble breathing.   Discontinued oxy IR and IV morphine. She had last received 10 mg oxy IR at 9 pm.   Will continue to monitor closely as will nursing staff.  Lucila Maine, DO PGY-1, [facility] 07/14/2016 1:37 AM - intern pager [phone]

## 2016-07-14 NOTE — Progress Notes (Signed)
Family Medicine Teaching Service Daily Progress Note Intern Pager: [phone]  Patient name: Tiffany [name] Medical record number: [id number] Date of birth: [birth year] Age: 56 y.o. Gender: female  Primary Care Provider: Archie Patten, MD Consultants: Orthopedic Surgery Code Status: Full  Assessment and Plan: Tiffany [name] is a 56 y.o. female with a past medical history significant for CKDII, T2DM, HFpEF, Asthma/COPD, HLD, HTN, Bipolar 1 disorder, paranoid schizophrenia who presented today with right lower extremity pain and deformity and found to have a displaced proximal fibular fracture.   #Right displaced tibia/fibula fracture s/p closed reduction and intramedullary  Orthopedic surgery recommend NWB and will return in 2 weeks for suture removal. Patient will be send home with CAM walker. Overnight patient was found to be less responsive likely secondary to pain medications, patient received oxycodone 10 mg. Patient received Narcan at 0100. Will adjust pain regimen.  --Follow up on Ortho consult, appreciate recs --Follow up on am CBC and CMP --Discontinue Fentanyl 25 mcg q2 prn for moderate pain --Continue Hydrocodone-acetaminophen 5-325 mg q6 prn --Discontinue oxycodone IR 5-10 mg q4 prn --Acetaminophen 650 mg q6 prn  #Fever s/p tib-fib fracture repair Patient febrile overnight with rest of vitals within normal limits. Patient had multiple procedure done two days ago and would be susceptible to possible infection. Our differential would include pneumonia though CXR was more concerning for volume overload. Infection from cholecystostomy drain change, or from surgical procedure.  --Will follow up on blood cultures --Monitor vitals  --Consider starting antibiotics if clinically warranted  #Asthma/COPD, chronic respiratory failure, stable Patient is currently on 8L [place], was tried on BiPAP overnight and did well. Increase oxygen requirement likely secondary to volume overload. Patient  had diffused crackles on exam yesterday morning. Overnight patient had a fever but rest of vitals were wnl. Patient with recent surgery, PE is possibility. D dimers was elevated at 2.02. Will follow up on further imaging. --Nocturnal Bipap post surgery --Consider CPAP on discharge until sleep study perform --Follow up on CTA  --Continue Duoneb q6 prn  --Continue Breo Ellipta (Fluticasone furoate-vilanterol) 1 puff daily --Continue Singulair 10 mg qhs  --Continue Tiotropium 18 mcg daily   #HFpEF, Chronic Last ECHO on 3/20 showed EF 60-65% with G2DD with normal wall motion. Patient is volume overloaded with crackles noted on exam and increase work of breathing. Overnight, UOP is 3142 ml much improved from yesterday. Weight has increased from 303 to 306 lb. --Continue Torsemide to 80 mg bid --Monitor I/O --Daily weights --Follow up on daily BMP --Continue aspirin 81 mg daily  #Acute on Chronic Kidney Dysfunction, chronic, resolving On admission creatinine was 1.4. Baseline appears to be around 1.1-1.2. This am creatinine was 1.20 --Follow up on daily BMP --Continue to hold Naproxen, Oxybutynin and Metformin --Avoid nephrotoxic agents  #Cholecystostomy, chronic Drain was exchanged yesterday by IR and patient tolerated the procedure. Patient had good output.    --Will continue to monitor  #Bipolar 1 disorder/Paranoid schizophrenia, chronic, stable Patient is followed by Dr. Berniece Andreas, previously at [facility]. Patient's last hospitalization was in Nov 2017 at [facility]. Last visit with psychiatrist on 6/14. Patient  Is on a extensive drug regimen that seem to be working for her. [name] level slightly sub therapeutic --Will continue divalproex 1000mg  daily --Will continue escitalopram 20 mg daily --Will continue Mirtazapine 45 mg qhs --Will continue Ziprasidone 80 mg qhs --Will reduce Cogentin to 0.5 mg qhs  Per psych note  #Bilateral Knee pain, chronic Patient  with history  of bilateral knee pain likely secondary to osteoarthritis in the setting of morbid obesity. --Continue to hold naproxen, CKD II and worsening creatinine --Continue to hold Gabapentin 600 mg tid daily, could reduce dose in setting of falls.  #Hypertension  On admission BP 121/72, with max of 150/95 likely secondary to pain. Patient has taken medications today. Will continue current home regimen given CKDII, hold if hypotensive. --Continue amlodipine 5 mg daily  #Hyperlipidemia Patient with HTN and HFpEF as well as T2DM though last A1c was 5.6. ASCVD based on last lipid panel (12/04/2015) is 7.6%. --Will continue atorvastatin 40 mg daily  #Urinary incontinence, Patient with a history of overactive bladder and urinary incontinence was started on oxybutynin by urology but asked to discontinue during previous hospitalizations . Given recent falls and polypharmacy with multiple meds with anticholinergic effect, will likely discontinue oxybutynin and start patient on [place]  --Will Hold oxybutynin in the setting of AKI --Consider starting patient on [place]  #T2DM Patient with a diagnosis of T2DM on metformin, however last A1c was 5.6. On Admission , BG was within normal limit. --Will hold metformin in the setting of AKI --Sensitive SSI --Monitor CBG qac/hs  #Obesity Followed by Dr.[facility] nutritionist). Patient has been trying to make changes to her diet and exercise plan for better weight control. --Will defer to PCP and outpatient nutritionist  #GERD --Protonix 40 mg daily  FEN/GI: Heart Healthy/ Carb modified Diet PPx: Lovenox  Disposition: SNF vs CIR vs Home health pending medical stabilization and likely surgical intervention  Subjective:  Patient is still mildly short of breath on exertion this morning, though improved from yesterday. Patient otherwise feels fine this morning.  Objective: Temp:  [98.6 F (37 C)-101.1 F (38.4 C)] 99.7 F (37.6 C)  (06/29 0600) Pulse Rate:  [8-96] 93 (06/29 0319) Resp:  [12-24] 17 (06/29 0319) BP: (135-171)/(78-129) 141/78 (06/29 0319) SpO2:  [85 %-100 %] 95 % (06/29 0319) FiO2 (%):  [50 %] 50 % (06/29 0049) Weight:  [306 lb 10.6 oz (139.1 kg)] 306 lb 10.6 oz (139.1 kg) (06/29 0500)  Physical Exam: General: Obese woman, in no acute distress, able to participate in exam and answer questions, coherent  Cardiac: RRR, normal heart sounds, no murmurs. 2+ radial and PT pulses bilaterally Respiratory: Mild wheezes noted on exam bilaterally upper and lower lung field, normal effort, mild crackles noted on exam. Abdomen: soft, nontender, nondistended, no hepatic or splenomegaly, +BS, cholecystotomy bag in place with bilious drainage in bag, no area of erythema noted Extremities: Right lower leg in splint wrapped in ACE bandage,left lower leg is normal. Edema noted on left leg. Skin: warm and dry, no rashes noted Neuro: alert and oriented x4, no focal deficits Psych: Normal affect and mood  Laboratory:  Recent Labs Lab 07/12/16 0433 07/13/16 0205 07/14/16 0258  WBC 8.8 12.6* 10.5  HGB 9.2* 9.5* 8.6*  HCT 31.5* 32.4* 30.2*  PLT 338 292 313    Recent Labs Lab 07/10/16 0352 07/11/16 0532 07/12/16 0433 07/13/16 0205 07/14/16 0258  NA 141 141 141 140 140  K 4.3 4.1 3.5 4.1 3.2*  CL 101 98* 97* 97* 96*  CO2 34* 33* 36* 31 35*  BUN 17 18 18 17 17   CREATININE 1.39*  1.44* 1.36* 1.48* 1.20* 1.32*  CALCIUM 8.6* 9.1 9.1 8.9 8.7*  PROT 6.1* 6.3*  --   --   --   BILITOT 0.6 0.3  --   --   --   ALKPHOS 150* 146*  --   --   --  ALT 40 31  --   --   --   AST 33 28  --   --   --   GLUCOSE 97 77 112* 133* 132*    Imaging/Diagnostic Tests: Dg Tibia/fibula Right  Result Date: 07/12/2016 CLINICAL DATA:  Fracture after a fall. EXAM: DG C-ARM 61-120 MIN; RIGHT TIBIA AND FIBULA - 2 VIEW COMPARISON:  None. FINDINGS: Open reduction internal fixation of the tibial fracture with intramedullary rod.  Satisfactory position and alignment. IMPRESSION: As above. Electronically Signed   By: Staci Righter M.D.   On: 07/12/2016 16:06   Ir Sinus/fist Tube Chk-non Gi  Result Date: 07/12/2016 INDICATION: Cholecystostomy tube check EXAM: SINUS TRACT INJECTION / FISTULOGRAM COMPARISON:  None. MEDICATIONS: The patient is currently admitted to the hospital and receiving intravenous antibiotics. The antibiotics were administered within an appropriate time frame prior to the initiation of the procedure. ANESTHESIA/SEDATION: None COMPLICATIONS: None immediate. TECHNIQUE: Informed written consent was obtained from the patient after a thorough discussion of the procedural risks, benefits and alternatives. All questions were addressed. Maximal Sterile Barrier Technique was utilized including caps, mask, sterile gowns, sterile gloves, sterile drape, hand hygiene and skin antiseptic. A timeout was performed prior to the initiation of the procedure. Contrast was injected into the cholecystostomy tube and imaging was obtained. PROCEDURE: The cholecystostomy tube is well positioned within the lumen of the gallbladder. Several gallstones are noted. The cystic duct partially opacified. IMPRESSION: The cholecystostomy tube remains well positioned in the gallbladder. It is functioning adequately. Cholelithiasis. Electronically Signed   By: Marybelle Killings M.D.   On: 07/12/2016 11:18   Dg Tibia/fibula Right Port  Result Date: 07/12/2016 CLINICAL DATA:  Postop fracture fixation. EXAM: PORTABLE RIGHT TIBIA AND FIBULA - 2 VIEW COMPARISON:  07/09/2016 FINDINGS: Interval fixation of the mid tibia fracture with a intramedullary rod along with 2 proximal and 2 distal interlocking screws. Good position and alignment without complicating features. There is also improved position and alignment of the proximal fibular shaft fracture. One half shaft width of medial displacement. The knee and ankle joints are maintained. IMPRESSION: Internal  fixation of mid tibia fracture with an intramedullary rod. Anatomic alignment without complicating features. Improved position and alignment of the proximal fibular shaft fracture. Electronically Signed   By: Marijo Sanes M.D.   On: 07/12/2016 17:05   Dg C-arm 1-60 Min  Result Date: 07/12/2016 CLINICAL DATA:  Fracture after a fall. EXAM: DG C-ARM 61-120 MIN; RIGHT TIBIA AND FIBULA - 2 VIEW COMPARISON:  None. FINDINGS: Open reduction internal fixation of the tibial fracture with intramedullary rod. Satisfactory position and alignment. IMPRESSION: As above. Electronically Signed   By: Staci Righter M.D.   On: 07/12/2016 16:06    Marjie Skiff, MD 07/14/2016, 7:15 AM PGY-1, [place] Intern pager: [phone], text pages welcome

## 2016-07-15 DIAGNOSIS — R0602 Shortness of breath: Secondary | ICD-10-CM

## 2016-07-15 LAB — BASIC METABOLIC PANEL
Anion gap: 11 (ref 5–15)
BUN: 20 mg/dL (ref 6–20)
CO2: 32 mmol/L (ref 22–32)
Calcium: 8.8 mg/dL — ABNORMAL LOW (ref 8.9–10.3)
Chloride: 97 mmol/L — ABNORMAL LOW (ref 101–111)
Creatinine, Ser: 1.29 mg/dL — ABNORMAL HIGH (ref 0.44–1.00)
GFR calc Af Amer: 52 mL/min — ABNORMAL LOW (ref >60)
GFR calc non Af Amer: 45 mL/min — ABNORMAL LOW (ref >60)
Glucose, Bld: 88 mg/dL (ref 65–99)
Potassium: 3.8 mmol/L (ref 3.5–5.1)
Sodium: 140 mmol/L (ref 135–145)

## 2016-07-15 LAB — GLUCOSE, CAPILLARY
Glucose-Capillary: 91 mg/dL (ref 65–99)
Glucose-Capillary: 134 mg/dL — ABNORMAL HIGH (ref 65–99)
Glucose-Capillary: 108 mg/dL — ABNORMAL HIGH (ref 65–99)
Glucose-Capillary: 97 mg/dL (ref 65–99)

## 2016-07-15 LAB — CBC
HCT: 29 % — ABNORMAL LOW (ref 36.0–46.0)
Hemoglobin: 8.4 g/dL — ABNORMAL LOW (ref 12.0–15.0)
MCH: 28.1 pg (ref 26.0–34.0)
MCHC: 29 g/dL — ABNORMAL LOW (ref 30.0–36.0)
MCV: 97 fL (ref 78.0–100.0)
Platelets: 302 10*3/uL (ref 150–400)
RBC: 2.99 MIL/uL — ABNORMAL LOW (ref 3.87–5.11)
RDW: 15.6 % — ABNORMAL HIGH (ref 11.5–15.5)
WBC: 10 10*3/uL (ref 4.0–10.5)

## 2016-07-15 NOTE — Progress Notes (Signed)
Family Medicine Teaching Service Daily Progress Note Intern Pager: [phone]  Patient name: Tiffany [name] Medical record number: [id number] Date of birth: 12/08/1960 Age: 56 y.o. Gender: female  Primary Care Provider: Archie Patten, MD Consultants: Orthopedic Surgery Code Status: Full  Assessment and Plan: Tiffany [name] is a 56 y.o. female with a past medical history significant for CKDII, T2DM, HFpEF, Asthma/COPD, HLD, HTN, Bipolar 1 disorder, paranoid schizophrenia who presented today with right lower extremity pain and deformity and found to have a displaced proximal fibular fracture.   #Right displaced tibia/fibula fracture s/p closed reduction and intramedullary  Orthopedic surgery recommend NWB with return in 2 weeks for suture removal. Patient will need SNF placement. Patient will be send home with CAM walker. Pain seem to be well controlled. Will work with PT. --Follow up on Ortho consult, appreciate recs --Follow up on am CBC and CMP --Continue Hydrocodone-acetaminophen 5-325 mg q6 prn --Acetaminophen 650 mg q6 prn  #Fever s/p tib-fib fracture repair Patient afebrile overnight with rest of vitals within normal limits. Patient was seen orthopedics yesterday who thought she was progressing appropriately. CTA yesterday did not show any signs of  PE in the setting of increase oxygen requirement.  --Will follow up on blood cultures --Monitor vitals  --Consider starting antibiotics if clinically warranted  #Asthma/COPD, chronic respiratory failure, stable Patient is currently on 7L [place], used BiPAP overnight and did well. Increase oxygen requirement likely secondary to volume overload. CT chest showed bilateral hazy infiltrates more suggestive of pulmonary edema, given recent surgery, lack of mobility and fever yesterday pneumonia would be on our differential. --Nocturnal Bipap post surgery --Consider CPAP on discharge until sleep study perform --Wean to Home oxygen as  tolerated. --Continue Duoneb q6 prn  --Continue Breo Ellipta (Fluticasone furoate-vilanterol) 1 puff daily --Continue Singulair 10 mg qhs  --Continue Tiotropium 18 mcg daily   #HFpEF, Chronic Last ECHO on 3/20 showed EF 60-65% with G2DD with normal wall motion. Patient is volume overloaded with crackles noted on exam and increase work of breathing. Overnight, UOP is 2000 ml . Weight is down to  303 from 306 lb. --Continue Torsemide to 80 mg bid --Monitor I/O --Daily weights --Follow up on daily BMP --Continue aspirin 81 mg daily  #Acute on Chronic Kidney Dysfunction, chronic, resolving On admission creatinine was 1.4. Baseline appears to be around 1.1-1.2. This am creatinine was 1.29. --Follow up on daily BMP --Continue to hold Naproxen, Oxybutynin and Metformin --Avoid nephrotoxic agents  #Cholecystostomy, chronic Drain was exchanged yesterday by IR and patient tolerated the procedure. Patient had good output.    --Will continue to monitor  #Bipolar 1 disorder/Paranoid schizophrenia, chronic, stable Patient is followed by Dr. Berniece Andreas, previously at [facility]. Patient's last hospitalization was in Nov 2017 at [facility]. Last visit with psychiatrist on 6/14. Patient  Is on a extensive drug regimen that seem to be working for her. [name] level slightly sub therapeutic --Will continue divalproex 1000mg  daily --Will continue escitalopram 20 mg daily --Will continue Mirtazapine 45 mg qhs --Will continue Ziprasidone 80 mg qhs --Will reduce Cogentin to 0.5 mg qhs  Per psych note  #Bilateral Knee pain, chronic Patient with history of bilateral knee pain likely secondary to osteoarthritis in the setting of morbid obesity. --Continue to hold naproxen, CKD II and worsening creatinine --Continue to hold Gabapentin 600 mg tid daily, could reduce dose in setting of falls.  #Hypertension  BP this 131/77. --Continue amlodipine 5 mg daily  #Hyperlipidemia Patient  with HTN  and HFpEF as well as T2DM though last A1c was 5.6. ASCVD based on last lipid panel (12/04/2015) is 7.6%. --Will continue atorvastatin 40 mg daily  #Urinary incontinence, Patient with a history of overactive bladder and urinary incontinence was started on oxybutynin by urology but asked to discontinue during previous hospitalizations . Given recent falls and polypharmacy with multiple meds with anticholinergic effect, will likely discontinue oxybutynin and start patient on [place]  --Will Hold oxybutynin in the setting of AKI --Consider starting patient on [place]  #T2DM Patient with a diagnosis of T2DM on metformin, however last A1c was 5.6. On Admission , BG was within normal limit. --Will hold metformin in the setting of AKI --Sensitive SSI --Monitor CBG qac/hs  #Obesity Followed by Dr.Skyes [facility] nutritionist). Patient has been trying to make changes to her diet and exercise plan for better weight control. --Will defer to PCP and outpatient nutritionist  #GERD --Protonix 40 mg daily  FEN/GI: Heart Healthy/ Carb modified Diet PPx: Lovenox  Disposition: SNF pending medical stabilization  Subjective:  Patient is feeling better this morning. Breathing has improved and she feels less overloaded. Pain is fairly well controlled. Will work with PT and sit in chair.  Objective: Temp:  [98 F (36.7 C)-99.5 F (37.5 C)] 98.3 F (36.8 C) (06/30 0317) Pulse Rate:  [83-91] 83 (06/30 0317) Resp:  [16-20] 18 (06/30 0317) BP: (126-165)/(76-104) 131/77 (06/30 0317) SpO2:  [86 %-94 %] 94 % (06/30 0317) Weight:  [303 lb 2.1 oz (137.5 kg)] 303 lb 2.1 oz (137.5 kg) (06/30 0317)  Physical Exam: General: Obese woman, in no acute distress, able to participate in exam and answer questions, coherent  Cardiac: RRR, normal heart sounds, no murmurs. 2+ radial and PT pulses bilaterally Respiratory: No wheezes noted on exam bilaterally upper and lower lung field, normal effort, no  crackles noted on exam. Abdomen: soft, nontender, nondistended, no hepatic or splenomegaly, +BS, cholecystotomy bag in place with bilious drainage in bag, no area of erythema noted Extremities: Right lower leg in splint wrapped in ACE bandage,left lower leg is normal. Edema noted on left leg. Skin: warm and dry, no rashes noted Neuro: alert and oriented x4, no focal deficits Psych: Normal affect and mood  Laboratory:  Recent Labs Lab 07/13/16 0205 07/14/16 0258 07/15/16 0201  WBC 12.6* 10.5 10.0  HGB 9.5* 8.6* 8.4*  HCT 32.4* 30.2* 29.0*  PLT 292 313 302    Recent Labs Lab 07/10/16 0352 07/11/16 0532  07/13/16 0205 07/14/16 0258 07/15/16 0201  NA 141 141  < > 140 140 140  K 4.3 4.1  < > 4.1 3.2* 3.8  CL 101 98*  < > 97* 96* 97*  CO2 34* 33*  < > 31 35* 32  BUN 17 18  < > 17 17 20   CREATININE 1.39*  1.44* 1.36*  < > 1.20* 1.32* 1.29*  CALCIUM 8.6* 9.1  < > 8.9 8.7* 8.8*  PROT 6.1* 6.3*  --   --   --   --   BILITOT 0.6 0.3  --   --   --   --   ALKPHOS 150* 146*  --   --   --   --   ALT 40 31  --   --   --   --   AST 33 28  --   --   --   --   GLUCOSE 97 77  < > 133* 132* 88  < > = values in this interval not  displayed.  Imaging/Diagnostic Tests: Dg Tibia/fibula Right  Result Date: 07/12/2016 CLINICAL DATA:  Fracture after a fall. EXAM: DG C-ARM 61-120 MIN; RIGHT TIBIA AND FIBULA - 2 VIEW COMPARISON:  None. FINDINGS: Open reduction internal fixation of the tibial fracture with intramedullary rod. Satisfactory position and alignment. IMPRESSION: As above. Electronically Signed   By: Staci Righter M.D.   On: 07/12/2016 16:06   Ct Angio Chest Pe W Or Wo Contrast  Result Date: 07/14/2016 CLINICAL DATA:  Shortness of breath today. EXAM: CT ANGIOGRAPHY CHEST WITH CONTRAST TECHNIQUE: Multidetector CT imaging of the chest was performed using the standard protocol during bolus administration of intravenous contrast. Multiplanar CT image reconstructions and MIPs were obtained to  evaluate the vascular anatomy. CONTRAST:  80 cc Isovue 370 COMPARISON:  Chest x-ray dated 07/15/2014 and chest CT dated 08/22/2015 FINDINGS: Cardiovascular: Satisfactory opacification of the pulmonary arteries to the segmental level. No evidence of pulmonary embolism. Cardiomegaly. No pericardial effusion. Mediastinum/Nodes: No enlarged mediastinal, hilar, or axillary lymph nodes. Thyroid gland, trachea, and esophagus demonstrate no significant findings. Lungs/Pleura: Hazy infiltrates in both upper lobes, particularly on the right and to a lesser degree in both lower lobes. This could represent pneumonitis or pulmonary edema. There are no pleural effusions however. Upper Abdomen: Normal. Musculoskeletal: No chest wall abnormality. No acute or significant osseous findings. Review of the MIP images confirms the above findings. IMPRESSION: 1. No pulmonary emboli. 2. Hazy infiltrates in both lungs, most likely representing mild pulmonary edema. Is the patient febrile? No pleural effusions. 3. Cardiomegaly. Electronically Signed   By: Lorriane Shire M.D.   On: 07/14/2016 14:00   Ir Sinus/fist Tube Chk-non Gi  Result Date: 07/12/2016 INDICATION: Cholecystostomy tube check EXAM: SINUS TRACT INJECTION / FISTULOGRAM COMPARISON:  None. MEDICATIONS: The patient is currently admitted to the hospital and receiving intravenous antibiotics. The antibiotics were administered within an appropriate time frame prior to the initiation of the procedure. ANESTHESIA/SEDATION: None COMPLICATIONS: None immediate. TECHNIQUE: Informed written consent was obtained from the patient after a thorough discussion of the procedural risks, benefits and alternatives. All questions were addressed. Maximal Sterile Barrier Technique was utilized including caps, mask, sterile gowns, sterile gloves, sterile drape, hand hygiene and skin antiseptic. A timeout was performed prior to the initiation of the procedure. Contrast was injected into the  cholecystostomy tube and imaging was obtained. PROCEDURE: The cholecystostomy tube is well positioned within the lumen of the gallbladder. Several gallstones are noted. The cystic duct partially opacified. IMPRESSION: The cholecystostomy tube remains well positioned in the gallbladder. It is functioning adequately. Cholelithiasis. Electronically Signed   By: Marybelle Killings M.D.   On: 07/12/2016 11:18   Dg Chest Port 1 View  Result Date: 07/14/2016 CLINICAL DATA:  Dyspnea and fever EXAM: PORTABLE CHEST 1 VIEW COMPARISON:  07/09/2016 FINDINGS: Vascular congestion is worse. Low volumes with vascular crowding and bibasilar atelectasis. Upper lungs clear. Central pulmonary edema is suspected. No pneumothorax pericardium. IMPRESSION: Above findings are most consistent with mild CHF and volume overload. Electronically Signed   By: Marybelle Killings M.D.   On: 07/14/2016 07:16   Dg Tibia/fibula Right Port  Result Date: 07/12/2016 CLINICAL DATA:  Postop fracture fixation. EXAM: PORTABLE RIGHT TIBIA AND FIBULA - 2 VIEW COMPARISON:  07/09/2016 FINDINGS: Interval fixation of the mid tibia fracture with a intramedullary rod along with 2 proximal and 2 distal interlocking screws. Good position and alignment without complicating features. There is also improved position and alignment of the proximal fibular shaft fracture. One half  shaft width of medial displacement. The knee and ankle joints are maintained. IMPRESSION: Internal fixation of mid tibia fracture with an intramedullary rod. Anatomic alignment without complicating features. Improved position and alignment of the proximal fibular shaft fracture. Electronically Signed   By: Marijo Sanes M.D.   On: 07/12/2016 17:05   Dg C-arm 1-60 Min  Result Date: 07/12/2016 CLINICAL DATA:  Fracture after a fall. EXAM: DG C-ARM 61-120 MIN; RIGHT TIBIA AND FIBULA - 2 VIEW COMPARISON:  None. FINDINGS: Open reduction internal fixation of the tibial fracture with intramedullary rod.  Satisfactory position and alignment. IMPRESSION: As above. Electronically Signed   By: Staci Righter M.D.   On: 07/12/2016 16:06    Marjie Skiff, MD 07/15/2016, 7:20 AM PGY-1, [place] Intern pager: [phone], text pages welcome

## 2016-07-15 NOTE — Progress Notes (Signed)
RT placed patient on BIPAP HS. Patient tolerating well at this time.  

## 2016-07-15 NOTE — Progress Notes (Signed)
Physical Therapy Treatment Patient Details Name: Tiffany [name] MRN: [id number] DOB: 08-Apr-1960 Today's Date: 07/15/2016    History of Present Illness 56 Y/O F with PMX of Morbid obesity, DM2, CVA, Seizure,Paranoid, OSA on CPAP,manic depression, HTN,HLD, COPD,CHF, CKD, Cocaine abuse presented after a fall sustaining R tibia fracture. Now pt s/p R tibia closed reduction and IM nail on 6/27.    PT Comments    Pt presents with an improvement in bed mobility this session and only requires assistance to bring RLE EOB. Pt continues to require Max A x2 for transfers with difficulty maintaining NWB on RLE throughout transfer. NT are present to assist and are instructed to use lift to get pt back to bed.     Follow Up Recommendations  SNF;Supervision/Assistance - 24 hour     Equipment Recommendations  Rolling walker with 5" wheels;Wheelchair (measurements PT);Wheelchair cushion (measurements PT);3in1 (PT)    Recommendations for Other Services       Precautions / Restrictions Precautions Precautions: Fall Precaution Comments: watch O2 sats, cholecystostomy  Required Braces or Orthoses: Other Brace/Splint Other Brace/Splint: R CAM boot Restrictions Weight Bearing Restrictions: Yes RLE Weight Bearing: Non weight bearing    Mobility  Bed Mobility Overal bed mobility: Needs Assistance Bed Mobility: Supine to Sit     Supine to sit: Min assist;HOB elevated     General bed mobility comments: MIn A to bring RLE EOB, otherwise able to perform most bed mobility without assistance  Transfers Overall transfer level: Needs assistance Equipment used: Rolling walker (2 wheeled) Transfers: Sit to/from [facility] Sit to Stand: Max assist;+2 physical assistance Stand pivot transfers: Max assist;+2 physical assistance       General transfer comment: continues to require Max A for sit to stand and stand pivot transfer. Pt having diffiuclty maintaining NWB on RLE and  meauvering LLE during transfer.   Ambulation/Gait             General Gait Details: unable   Stairs            Wheelchair Mobility    Modified Rankin (Stroke Patients Only)       Balance Overall balance assessment: Needs assistance Sitting-balance support: Feet supported;Single extremity supported Sitting balance-Leahy Scale: Fair     Standing balance support: Bilateral upper extremity supported Standing balance-Leahy Scale: Zero Standing balance comment: could not stand to RW without putting too much weight on right LE.                             Cognition Arousal/Alertness: Awake/alert Behavior During Therapy: WFL for tasks assessed/performed Overall Cognitive Status: Within Functional Limits for tasks assessed                                        Exercises      General Comments        Pertinent Vitals/Pain Pain Assessment: 0-10 Pain Score: 10-Worst pain ever Faces Pain Scale: Hurts little more Pain Location: RLE Pain Descriptors / Indicators: Aching;Grimacing;Guarding Pain Intervention(s): Monitored during session;Premedicated before session;Repositioned    Home Living                      Prior Function            PT Goals (current goals can now be found in the care plan section) Acute  Rehab PT Goals Patient Stated Goal: rehab before home Progress towards PT goals: Progressing toward goals    Frequency    Min 3X/week      PT Plan Current plan remains appropriate    Co-evaluation              AM-PAC PT "6 Clicks" Daily Activity  Outcome Measure  Difficulty turning over in bed (including adjusting bedclothes, sheets and blankets)?: Total Difficulty moving from lying on back to sitting on the side of the bed? : Total Difficulty sitting down on and standing up from a chair with arms (e.g., wheelchair, bedside commode, etc,.)?: Total Help needed moving to and from a bed to chair  (including a wheelchair)?: Total Help needed walking in hospital room?: Total Help needed climbing 3-5 steps with a railing? : Total 6 Click Score: 6    End of Session Equipment Utilized During Treatment: Gait belt;Oxygen Activity Tolerance: Patient limited by pain Patient left: in chair;with call bell/phone within reach;with nursing/sitter in room Nurse Communication: Mobility status;Need for lift equipment PT Visit Diagnosis: Unsteadiness on feet (R26.81);Pain;History of falling (Z91.81);Muscle weakness (generalized) (M62.81) Pain - Right/Left: Right Pain - part of body: Leg;Ankle and joints of foot     Time: 1610-9604 PT Time Calculation (min) (ACUTE ONLY): 19 min  Charges:  $Therapeutic Activity: 8-22 mins                    G Codes:       Scheryl Marten PT, DPT  [phone]    Shanon Rosser 07/15/2016, 9:17 AM

## 2016-07-16 LAB — TYPE AND SCREEN
ABO/RH(D): O POS
Antibody Screen: NEGATIVE
Donor AG Type: NEGATIVE
Donor AG Type: NEGATIVE
Unit division: 0
Unit division: 0
Unit division: 0

## 2016-07-16 LAB — BASIC METABOLIC PANEL
Anion gap: 8 (ref 5–15)
BUN: 24 mg/dL — ABNORMAL HIGH (ref 6–20)
CO2: 37 mmol/L — ABNORMAL HIGH (ref 22–32)
Calcium: 9 mg/dL (ref 8.9–10.3)
Chloride: 93 mmol/L — ABNORMAL LOW (ref 101–111)
Creatinine, Ser: 1.46 mg/dL — ABNORMAL HIGH (ref 0.44–1.00)
GFR calc Af Amer: 45 mL/min — ABNORMAL LOW (ref >60)
GFR calc non Af Amer: 39 mL/min — ABNORMAL LOW (ref >60)
Glucose, Bld: 94 mg/dL (ref 65–99)
Potassium: 3.1 mmol/L — ABNORMAL LOW (ref 3.5–5.1)
Sodium: 138 mmol/L (ref 135–145)

## 2016-07-16 LAB — GLUCOSE, CAPILLARY
Glucose-Capillary: 83 mg/dL (ref 65–99)
Glucose-Capillary: 105 mg/dL — ABNORMAL HIGH (ref 65–99)
Glucose-Capillary: 134 mg/dL — ABNORMAL HIGH (ref 65–99)
Glucose-Capillary: 92 mg/dL (ref 65–99)

## 2016-07-16 LAB — CBC
HCT: 28.2 % — ABNORMAL LOW (ref 36.0–46.0)
Hemoglobin: 8.2 g/dL — ABNORMAL LOW (ref 12.0–15.0)
MCH: 28.1 pg (ref 26.0–34.0)
MCHC: 29.1 g/dL — ABNORMAL LOW (ref 30.0–36.0)
MCV: 96.6 fL (ref 78.0–100.0)
Platelets: 322 10*3/uL (ref 150–400)
RBC: 2.92 MIL/uL — ABNORMAL LOW (ref 3.87–5.11)
RDW: 15.3 % (ref 11.5–15.5)
WBC: 10.2 10*3/uL (ref 4.0–10.5)

## 2016-07-16 LAB — BPAM RBC
Blood Product Expiration Date: 201807202359
Blood Product Expiration Date: 201807202359
Blood Product Expiration Date: 201807202359
ISSUE DATE / TIME: 201806222236
Unit Type and Rh: 5100
Unit Type and Rh: 5100
Unit Type and Rh: 5100

## 2016-07-16 MED ORDER — HYDROCODONE-ACETAMINOPHEN 5-325 MG PO TABS
1.0000 | ORAL_TABLET | Freq: Four times a day (QID) | ORAL | Status: DC | PRN
  Administered 2016-07-16 – 2016-07-20 (×9): 1 via ORAL
  Filled 2016-07-16 (×11): qty 1

## 2016-07-16 MED ORDER — POTASSIUM CHLORIDE CRYS ER 20 MEQ PO TBCR
40.0000 meq | EXTENDED_RELEASE_TABLET | ORAL | Status: AC
  Administered 2016-07-16 (×2): 40 meq via ORAL
  Filled 2016-07-16 (×2): qty 2

## 2016-07-16 NOTE — Progress Notes (Signed)
FMTS paged for K 3.1

## 2016-07-16 NOTE — Progress Notes (Signed)
Family Medicine Teaching Service Daily Progress Note Intern Pager: [phone]  Patient name: Tiffany [name] Medical record number: [id number] Date of birth: [birth year] Age: 56 y.o. Gender: female  Primary Care Provider: [name], MD Consultants: Orthopedic Surgery Code Status: Full   Assessment and Plan: Tiffany [name] is a 56 y.o. female with a past medical history significant for CKDII, T2DM, HFpEF, Asthma/COPD, HLD, HTN, Bipolar 1 disorder, paranoid schizophrenia who presented today with right lower extremity pain and deformity and found to have a displaced proximal fibular fracture.    #Right displaced tibia/fibula fracture s/p closed reduction and intramedullary  Pain currently well controlled.  Orthopedic surgery recommend NWB with return in 2 weeks for suture removal. Patient will need SNF placement. Patient will be sent home with CAM walker.  Will work with PT.  - PT recommended SNF, [name] consulted  --Follow up Ortho recommendations   --Follow up on am CBC and CMP --Continue Hydrocodone-acetaminophen 5-325 mg q6 prn (will reduce to 1 tab due to excessive sedation with 2 tablets) --Acetaminophen 650 mg q6 prn   #Hypokalemia- 3.1 this AM -Replete with Kdur 40 mEq x2  -AM BMET   #Fever s/p tib-fib fracture repair Fever x1 postop, patient otherwise has remained afebrile with stable vitals. CTA did not show any signs of  PE in the setting of increased oxygen requirement --f/u Bcx >NGx1D  --Monitor vitals  --Consider starting antibiotics if clinically warranted   #Asthma/COPD, chronic respiratory failure, stable Patient is currently on 8L [place], used BiPAP overnight and tolerated well. Increased oxygen requirement likely secondary to volume overload. CT chest showed bilateral hazy infiltrates more suggestive of pulmonary edema, given recent surgery, lack of mobility and fever, pneumonia would be on our differential.  --Nocturnal Bipap post surgery, tolerating well  --Would  recommend CPAP on discharge until outpatient sleep study performed  --Wean to Home 6L oxygen as tolerated >currently 8 L  --Continue Duoneb q6 prn  --Continue Breo Ellipta (Fluticasone furoate-vilanterol) 1 puff daily --Continue Singulair 10 mg qhs  --Continue Tiotropium 18 mcg daily   #HFpEF, Chronic Last ECHO on 3/20 showed EF 60-65% with G2DD with normal wall motion. On exam this morning appears euvolemic.   UOP x24h 2550 ml . Weight down to 302 from 306 lb on admission.  Is approaching her dry weight of ~300 lbs.   --Continue Torsemide to 40 mg bid  --Monitor I/O --Daily weights --Follow up on daily BMP --Continue aspirin 81 mg daily  #Acute on Chronic Kidney Dysfunction, chronic, resolving On admission creatinine was 1.4. Baseline appears to be around 1.1-1.2. This AM SCr was 1.46.   -Will KVO IVFs as she is taking po - Daily BMET to monitor  --Continue to hold Naproxen, Oxybutynin and Metformin  --Avoid nephrotoxic agents   #Cholecystostomy, chronic Drain was exchanged by IR and patient tolerated the procedure. Patient had good output and continues to drain well.  --Will continue to monitor  #Bipolar 1 disorder/Paranoid schizophrenia, chronic, stable Patient is followed by Dr. Berniece Andreas, previously at [facility]. Patient's last hospitalization was in Nov 2017 at [facility]. Last visit with psychiatrist on 6/14. Patient is on a extensive drug regimen that seem to be working for her. [name] level slightly sub therapeutic  --Will continue divalproex 1000mg  daily --Will continue escitalopram 20 mg daily --Will continue Mirtazapine 45 mg qhs --Will continue Ziprasidone 80 mg qhs --Will reduce Cogentin to 0.5 mg qhs  Per psych note  #Bilateral Knee pain, chronic Patient with history  of bilateral knee pain likely secondary to osteoarthritis in the setting of morbid obesity. --Continue to hold naproxen, CKD II and worsening creatinine --Continue to hold  Gabapentin 600 mg tid daily, could reduce dose in setting of falls.  #Hypertension  BP this 124/72. --Continue amlodipine 5 mg daily   #Hyperlipidemia Patient with HTN and HFpEF as well as T2DM though last A1c was 5.6. ASCVD based on last lipid panel (12/04/2015) is 7.6%. --Will continue atorvastatin 40 mg daily   #Urinary incontinence, Patient with a history of overactive bladder and urinary incontinence was started on oxybutynin by urology but asked to discontinue during previous hospitalizations . Given recent falls and polypharmacy with multiple meds with anticholinergic effect, will likely discontinue oxybutynin and start patient on Miragrebon  --Will Hold oxybutynin in the setting of AKI  --Consider starting patient on Miragrebon on discharge   #T2DM Patient with a diagnosis of T2DM on metformin, however last A1c was 5.6. On Admission , BG was within normal limit. --Will hold metformin in the setting of AKI  --Sensitive SSI --Monitor CBG qac/hs  #Obesity Followed by Dr.Skyes [facility] nutritionist). Patient has been trying to make changes to her diet and exercise plan for better weight control. --Will defer to PCP and outpatient nutritionist  #GERD --Protonix 40 mg daily  FEN/GI: Heart Healthy/ Carb modified Diet, NS @ KVO  PPx: Lovenox  Disposition: SNF pending medical stabilization   Subjective:  Patient notes her right leg pain is well controlled currently.  She denies shortness of breath and feels comfortable on 8L.  Agreeble to weaning to home oxygen today.  Feels less fluid overloaded.  No other concerns at this time.    Objective: Temp:  [98.1 F (36.7 C)-99.1 F (37.3 C)] 98.4 F (36.9 C) (07/01 1209) Pulse Rate:  [74-92] 76 (07/01 1209) Resp:  [14-23] 14 (07/01 1209) BP: (116-146)/(68-88) 116/72 (07/01 1209) SpO2:  [88 %-99 %] 90 % (07/01 1209) Weight:  [302 lb 7.5 oz (137.2 kg)] 302 lb 7.5 oz (137.2 kg) (07/01 0313)    Physical Exam:  General: Obese  woman, appears comfortable, in no acute distress, Moody in place  Cardiac: RRR, no MRG  Respiratory: CTAB, no crackles or wheeze noted, comfortable work of breathing  Abdomen: soft, NTND, no hepatic or splenomegaly, +BS, cholecystotomy bag in place with bilious drainage in bag, no area of erythema noted Extremities: Right lower leg in boot, no edema over left leg Skin: warm and dry, no rashes noted  Neuro: alert and oriented x4, no focal deficits Psych: Normal affect and mood  Laboratory:  Recent Labs Lab 07/14/16 0258 07/15/16 0201 07/16/16 0249  WBC 10.5 10.0 10.2  HGB 8.6* 8.4* 8.2*  HCT 30.2* 29.0* 28.2*  PLT 313 302 322    Recent Labs Lab 07/10/16 0352 07/11/16 0532  07/14/16 0258 07/15/16 0201 07/16/16 0249  NA 141 141  < > 140 140 138  K 4.3 4.1  < > 3.2* 3.8 3.1*  CL 101 98*  < > 96* 97* 93*  CO2 34* 33*  < > 35* 32 37*  BUN 17 18  < > 17 20 24*  CREATININE 1.39*  1.44* 1.36*  < > 1.32* 1.29* 1.46*  CALCIUM 8.6* 9.1  < > 8.7* 8.8* 9.0  PROT 6.1* 6.3*  --   --   --   --   BILITOT 0.6 0.3  --   --   --   --   ALKPHOS 150* 146*  --   --   --   --  ALT 40 31  --   --   --   --   AST 33 28  --   --   --   --   GLUCOSE 97 77  < > 132* 88 94  < > = values in this interval not displayed.  Imaging/Diagnostic Tests: Dg Tibia/fibula Right  Result Date: 07/12/2016 CLINICAL DATA:  Fracture after a fall. EXAM: DG C-ARM 61-120 MIN; RIGHT TIBIA AND FIBULA - 2 VIEW COMPARISON:  None. FINDINGS: Open reduction internal fixation of the tibial fracture with intramedullary rod. Satisfactory position and alignment. IMPRESSION: As above. Electronically Signed   By: Staci Righter M.D.   On: 07/12/2016 16:06   Ct Angio Chest Pe W Or Wo Contrast  Result Date: 07/14/2016 CLINICAL DATA:  Shortness of breath today. EXAM: CT ANGIOGRAPHY CHEST WITH CONTRAST TECHNIQUE: Multidetector CT imaging of the chest was performed using the standard protocol during bolus administration of intravenous  contrast. Multiplanar CT image reconstructions and MIPs were obtained to evaluate the vascular anatomy. CONTRAST:  80 cc Isovue 370 COMPARISON:  Chest x-ray dated 07/15/2014 and chest CT dated 08/22/2015 FINDINGS: Cardiovascular: Satisfactory opacification of the pulmonary arteries to the segmental level. No evidence of pulmonary embolism. Cardiomegaly. No pericardial effusion. Mediastinum/Nodes: No enlarged mediastinal, hilar, or axillary lymph nodes. Thyroid gland, trachea, and esophagus demonstrate no significant findings. Lungs/Pleura: Hazy infiltrates in both upper lobes, particularly on the right and to a lesser degree in both lower lobes. This could represent pneumonitis or pulmonary edema. There are no pleural effusions however. Upper Abdomen: Normal. Musculoskeletal: No chest wall abnormality. No acute or significant osseous findings. Review of the MIP images confirms the above findings. IMPRESSION: 1. No pulmonary emboli. 2. Hazy infiltrates in both lungs, most likely representing mild pulmonary edema. Is the patient febrile? No pleural effusions. 3. Cardiomegaly. Electronically Signed   By: Lorriane Shire M.D.   On: 07/14/2016 14:00   Dg Chest Port 1 View  Result Date: 07/14/2016 CLINICAL DATA:  Dyspnea and fever EXAM: PORTABLE CHEST 1 VIEW COMPARISON:  07/09/2016 FINDINGS: Vascular congestion is worse. Low volumes with vascular crowding and bibasilar atelectasis. Upper lungs clear. Central pulmonary edema is suspected. No pneumothorax pericardium. IMPRESSION: Above findings are most consistent with mild CHF and volume overload. Electronically Signed   By: Marybelle Killings M.D.   On: 07/14/2016 07:16   Dg Tibia/fibula Right Port  Result Date: 07/12/2016 CLINICAL DATA:  Postop fracture fixation. EXAM: PORTABLE RIGHT TIBIA AND FIBULA - 2 VIEW COMPARISON:  07/09/2016 FINDINGS: Interval fixation of the mid tibia fracture with a intramedullary rod along with 2 proximal and 2 distal interlocking screws.  Good position and alignment without complicating features. There is also improved position and alignment of the proximal fibular shaft fracture. One half shaft width of medial displacement. The knee and ankle joints are maintained. IMPRESSION: Internal fixation of mid tibia fracture with an intramedullary rod. Anatomic alignment without complicating features. Improved position and alignment of the proximal fibular shaft fracture. Electronically Signed   By: Marijo Sanes M.D.   On: 07/12/2016 17:05   Dg C-arm 1-60 Min  Result Date: 07/12/2016 CLINICAL DATA:  Fracture after a fall. EXAM: DG C-ARM 61-120 MIN; RIGHT TIBIA AND FIBULA - 2 VIEW COMPARISON:  None. FINDINGS: Open reduction internal fixation of the tibial fracture with intramedullary rod. Satisfactory position and alignment. IMPRESSION: As above. Electronically Signed   By: Staci Righter M.D.   On: 07/12/2016 16:06   Lovenia Kim, MD 07/16/2016, 12:59 PM  PGY-2, [place] Intern pager: [phone], text pages welcome

## 2016-07-16 NOTE — Discharge Summary (Signed)
[name] Discharge Summary  Patient name: Tiffany [name] Medical record number: [id number] Date of birth: Mar 15, 1960 Age: 56 y.o. Gender: female Date of Admission: 07/09/2016  Date of Discharge: 07/20/16 Admitting Physician: Kinnie Feil, MD  Primary Care Provider: [name], MD Consultants: Orthopedics, Pulmonology   Indication for Hospitalization: right mid-shaft tib/fib fracture secondary to mechanical fall  Discharge Diagnoses/Problem List:  Patient Active Problem List   Diagnosis Date Noted  . History of open reduction and internal fixation (ORIF) procedure   . Fracture tibia/fibula, right, closed, initial encounter   . Cholecystostomy care (Tiffany [name])   . Pain   . Fibula fracture 07/10/2016  . Closed displaced transverse fracture of shaft of right tibia   . Fall   . Preoperative clearance   . Overactive bladder 06/07/2016  . Gallstones   . Upper abdominal pain   . Cholecystitis 05/09/2016  . QT prolongation   . OSA and COPD overlap syndrome (Tiffany [name])   . Generalized weakness   . Arthritis   . Bipolar I disorder (Tiffany [name])   . Hypokalemia 04/21/2016  . Wheezing 04/12/2016  . Acute heart failure (Tiffany [name])   . Benign essential HTN 03/22/2016  . Rectal bleeding 12/31/2015  . Bipolar affective disorder, mixed, severe, with psychotic behavior (Tiffany [name]) 11/28/2015  . Cocaine use disorder, severe, dependence (Tiffany [name]) 11/28/2015  . Hyponatremia 11/25/2015  . AKI (acute kidney injury) ([place]) 11/24/2015  . Blood in sputum   . Chronic diastolic heart failure (Tiffany [name])   . Dyspnea   . Chronic respiratory failure with hypoxia and hypercapnia (Tiffany [name]) 06/22/2015  . Onychomycosis 01/21/2015  . Nasal congestion 12/17/2014  . Tobacco use disorder 07/22/2014  . COPD (chronic obstructive pulmonary disease) (Tiffany [name]) 07/08/2014  . Seborrheic keratoses 12/31/2013  . Knee pain, bilateral 01/22/2013  . Seizure (Tiffany [name]) 01/04/2013  . Health care maintenance 11/25/2012  .  Folliculitis 87/19/5974  . History of kidney stones 06/18/2012  . Chronic pain syndrome 06/18/2012  . Muscle spasm 06/18/2012  . Elevated troponin 04/28/2012  . Allergic reaction 04/07/2012  . Itching 09/06/2011  . HSV infection 08/30/2011  . Dyslipidemia 04/24/2011  . Anemia 04/24/2011  . Leukocytosis 04/24/2011  . Diabetic neuropathy (Tiffany [name]) 04/24/2011  . Obstructive sleep apnea 10/18/2010  . Asthma 10/18/2010  . Morbid obesity (Tiffany [name]) 10/18/2010  . Type 2 diabetes mellitus (Tiffany [name]) 10/18/2010     Disposition: SNF  Discharge Condition: stable, improving  Discharge Exam: See Progress Note from day of discharge  Brief Hospital Course:  Tiffany [name] a 56 y.o.femalewith a past medical history significant for CKDII, T2DM, HFpEF, Asthma/COPD, HLD, HTN, Bipolar 1 disorder, paranoid schizophrenia who presented with right lower extremity pain and deformity and found to have a displaced proximal fibular fracture.    Patient had mechanical fall at home and was found to have a displaced tib/fib fracture. Orthopedics was consulted and recommended intramedullary rod/nail. However given patient's multiple medical problems, Pulmonology was consulted for medical clearance and agreed to regional/local anesthesia. Patient did well in surgery, required nocturnal BiPAP post op, orthopedics recommended NWB with return in 2 weeks for suture removal. She had a post op fever and increased oxygen requirement due to some fluid overload requiring diuresis. Of note a CTA chest was negative for PE. During hospitalization, patient also had her cholecystostomy drain changed by IR on 6/26 and tolerated the procedure well. However, it became malpositioned on 7/4, and IR reassessed on 7/5. Patient will need to continue aggressive diuresis given propensity for fluid overload  and weight on admission that was 20 lb higher than dry weight. Patient is on many anticholinergic medications and will need optimization upon  discharge to avoid repeat fall in the future.  Issues for Follow Up:  1. Orthopedics recommended NWB with return in 2 weeks for suture removal. Send home with CAM boot.  2. Patient's oxybutynin was discharge due to concern for anticholinergic properties. Patient should be started on Mirabegron (Myrbetriq) 3. Decrease Cogentin to 0.5 mg daily NOT 1 mg daily  4. Continue with torsemide 40 mg bid, fluid intake need to be addressed. 5. Follow BMP 6. Pain regimen needed for the next few days, with good taper plan. 7. Patient will need to follow up with sleep study, needs CPAP at night  8. Will need outpatient follow up with psych.   Significant Procedures: Right displaced tibia/fibula fracture s/p closed reduction and intramedullary fixation  Significant Labs and Imaging:   Recent Labs Lab 07/17/16 0238 07/18/16 0236 07/19/16 0525  WBC 9.7 9.2 9.0  HGB 8.0* 8.6* 10.1*  HCT 27.8* 30.1* 35.1*  PLT 366 413* 469*    Recent Labs Lab 07/15/16 0201 07/16/16 0249 07/17/16 0238 07/18/16 0236 07/19/16 0525  NA 140 138 138 143 144  K 3.8 3.1* 3.4* 4.2 4.1  CL 97* 93* 95* 97* 96*  CO2 32 37* 36* 39* 38*  GLUCOSE 88 94 106* 92 89  BUN 20 24* 24* 22* 17  CREATININE 1.29* 1.46* 1.41* 1.27* 1.16*  CALCIUM 8.8* 9.0 8.7* 9.2 9.5   Dg Chest 1 View  Result Date: 07/09/2016 CLINICAL DATA:  Fall with leg injury EXAM: CHEST 1 VIEW COMPARISON:  05/30/2016 FINDINGS: Mild cardiomegaly with prominent central pulmonary artery and mild central vascular congestion. No acute consolidation. No pleural effusion. No pneumothorax. IMPRESSION: Cardiomegaly with central vascular congestion. Electronically Signed   By: Donavan Foil M.D.   On: 07/09/2016 23:22   Dg Pelvis 1-2 Views  Result Date: 07/09/2016 CLINICAL DATA:  Fall EXAM: PELVIS - 1-2 VIEW COMPARISON:  12/17/2013 FINDINGS: SI joints do not appear widened. The pubic symphysis is intact. The rami are unremarkable. Mild arthritis at the bilateral hips.  No fracture or dislocation. IMPRESSION: No acute osseous abnormality Electronically Signed   By: Donavan Foil M.D.   On: 07/09/2016 23:23   Dg Knee 1-2 Views Right  Result Date: 07/09/2016 CLINICAL DATA:  Fall with injury to the leg EXAM: RIGHT KNEE - 1-2 VIEW COMPARISON:  None. FINDINGS: Fracture through the proximal shaft of the fibula with displacement and overriding. Mild degenerative changes at the patellofemoral compartment and medial joint space. Trace suprapatellar effusion. IMPRESSION: 1. Displaced proximal fibular fracture 2. Trace joint effusion Electronically Signed   By: Donavan Foil M.D.   On: 07/09/2016 23:19   Dg Tibia/fibula Right  Result Date: 07/09/2016 CLINICAL DATA:  Fall EXAM: RIGHT TIBIA AND FIBULA - 2 VIEW COMPARISON:  None. FINDINGS: Acute fracture involving the proximal shaft of the fibula with 1 bone with of medial displacement of the distal fracture fragment and approximately 16 mm of overriding. 1/2 bone with of posterior displacement of distal fibular fracture fragment. Additional acute fracture involving the midshaft of the tibia with just under 1 bone with of lateral displacement of distal fracture fragment. About 1/2 bone with of posterior displacement of distal fracture fragment. IMPRESSION: 1. Acute displaced and overriding proximal fibular fracture 2. Acute displaced mid tibial fracture Electronically Signed   By: Donavan Foil M.D.   On: 07/09/2016 23:21  Dg Ankle 2 Views Right  Result Date: 07/09/2016 CLINICAL DATA:  Fall EXAM: RIGHT ANKLE - 2 VIEW COMPARISON:  None. FINDINGS: There is no evidence of fracture, dislocation, or joint effusion. Limited cross-table lateral positioning. IMPRESSION: No acute osseous abnormality Electronically Signed   By: Donavan Foil M.D.   On: 07/09/2016 23:21   Dg Femur Min 2 Views Right  Result Date: 07/09/2016 CLINICAL DATA:  History of fall, deformity to right tibia with pain in the entire right lower leg EXAM: RIGHT FEMUR 2  VIEWS COMPARISON:  None. FINDINGS: Mild patellofemoral degenerative change. No acute fracture or malalignment. Trace suprapatellar effusion. Limited lateral views of the right femur due to habitus and positioning. IMPRESSION: No definite acute osseous abnormality allowing for habitus and limited positioning Electronically Signed   By: Donavan Foil M.D.   On: 07/09/2016 23:17    Results/Tests Pending at Time of Discharge: None  Discharge Medications:  Allergies as of 07/20/2016      Reactions   Hydroxyzine Shortness Of Breath   -throat closed up   Latuda [lurasidone Hcl] Anaphylaxis   Codeine Nausea And Vomiting   Sulfa Antibiotics Itching      Medication List    STOP taking these medications   oxybutynin 5 MG 24 hr tablet Commonly known as:  DITROPAN-XL     TAKE these medications   ACCU-CHEK AVIVA PLUS w/Device Kit CHECK BLOOD SUGAR THREE TIMES DAILY   VENTOLIN HFA 108 (90 Base) MCG/ACT inhaler Generic drug:  albuterol Inhale 2 puffs into the lungs every 6 (six) hours as needed for wheezing or shortness of breath.   albuterol (2.5 MG/3ML) 0.083% nebulizer solution Commonly known as:  PROVENTIL Take 3 mLs (2.5 mg total) by nebulization every 6 (six) hours as needed for wheezing or shortness of breath.   amLODipine 5 MG tablet Commonly known as:  NORVASC Take 1 tablet (5 mg total) by mouth daily.   aspirin 81 MG EC tablet Take 1 tablet (81 mg total) by mouth daily. Swallow whole.   atorvastatin 40 MG tablet Commonly known as:  LIPITOR Take 1 tablet (40 mg total) by mouth daily.   B-D SINGLE USE SWABS REGULAR Pads CHECK CAPILLARY BLOOD GLUCOSE ONE TIME DAILY   benztropine 0.5 MG tablet Commonly known as:  COGENTIN Take 1 tablet (0.5 mg total) by mouth daily. What changed:  when to take this   divalproex 500 MG 24 hr tablet Commonly known as:  [name] ER Take 2 tablets (1,000 mg total) by mouth at bedtime. What changed:  when to take this   escitalopram 20 MG  tablet Commonly known as:  LEXAPRO Take 1 tablet (20 mg total) by mouth daily.   fluticasone furoate-vilanterol 100-25 MCG/INH Aepb Commonly known as:  BREO ELLIPTA INHALE 1 PUFF EVERY DAY   gabapentin 600 MG tablet Commonly known as:  NEURONTIN Take 1 tablet (600 mg total) by mouth 3 (three) times daily.   glucose blood test strip Use to check blood sugar 3 times daily.   metFORMIN 500 MG 24 hr tablet Commonly known as:  GLUCOPHAGE-XR Take 1 tablet (500 mg total) by mouth 2 (two) times daily.   mirtazapine 45 MG tablet Commonly known as:  REMERON Take 1 tablet (45 mg total) by mouth at bedtime.   montelukast 10 MG tablet Commonly known as:  SINGULAIR Take 1 tablet (10 mg total) by mouth at bedtime.   multivitamin with minerals Tabs tablet Take 1 tablet by mouth daily.   NAPROXEN DR  500 MG EC tablet Generic drug:  naproxen Take 500 mg by mouth 2 (two) times daily as needed (pain).   omeprazole 40 MG capsule Commonly known as:  PRILOSEC Take 1 capsule (40 mg total) by mouth 2 (two) times daily.   OXYGEN Inhale 6 L into the lungs daily.   potassium chloride SA 20 MEQ tablet Commonly known as:  K-DUR,KLOR-CON Take 2.5 tablets (50 mEq total) by mouth 2 (two) times daily.   tiotropium 18 MCG inhalation capsule Commonly known as:  SPIRIVA HANDIHALER Place 1 capsule (18 mcg total) into inhaler and inhale daily.   torsemide 20 MG tablet Commonly known as:  DEMADEX Take 2 tablets (40 mg total) by mouth 2 (two) times daily. What changed:  how much to take   ziprasidone 80 MG capsule Commonly known as:  GEODON Take 1 capsule (80 mg total) by mouth at bedtime.       Discharge Instructions: Please refer to Patient Instructions section of EMR for full details.  Patient was counseled important signs and symptoms that should prompt return to medical care, changes in medications, dietary instructions, activity restrictions, and follow up appointments.   Follow-Up  Appointments:  Contact information for follow-up providers    Leandrew Koyanagi, MD Follow up in 2 week(s).   Specialty:  Orthopedic Surgery Why:  For suture removal, For wound re-check Contact information: [place] [address] [phone]        [name], MD. Schedule an appointment as soon as possible for a visit.   Specialty:  Family Medicine Why:  Within the next week Contact information: [place] [id number] [phone]            Contact information for after-discharge care    Destination    [facility] Follow up.   Specialty:  Skilled Nursing Facility Contact information: [place] [phone]                  Maia Breslow, MD 07/20/2016, 4:59 PM PGY-1, [place] [name]

## 2016-07-16 NOTE — Progress Notes (Signed)
Patient sats 93% on 6L HFNC

## 2016-07-16 NOTE — Progress Notes (Addendum)
MD paged at request of patient in regards to [facility] external female catheter. Patient request for wall suction to be turned up to between 80-90 and this RN refuse to turn above 40 per product instructions/recommendations.   [id number]: FMTS called back and advise not to turn suction above 40 per product instructions. Patient made aware of MD recommendations.

## 2016-07-16 NOTE — Progress Notes (Signed)
Pt placed on V60 Bipap for night time rest tolerating well

## 2016-07-16 NOTE — Progress Notes (Signed)
Patient has removed 2 IVs while nursing not in room today. No sites seen, IV team paged. Supplies in room to secure next site to protect from patient removal.

## 2016-07-17 LAB — BASIC METABOLIC PANEL
Anion gap: 7 (ref 5–15)
BUN: 24 mg/dL — ABNORMAL HIGH (ref 6–20)
CO2: 36 mmol/L — ABNORMAL HIGH (ref 22–32)
Calcium: 8.7 mg/dL — ABNORMAL LOW (ref 8.9–10.3)
Chloride: 95 mmol/L — ABNORMAL LOW (ref 101–111)
Creatinine, Ser: 1.41 mg/dL — ABNORMAL HIGH (ref 0.44–1.00)
GFR calc Af Amer: 47 mL/min — ABNORMAL LOW (ref >60)
GFR calc non Af Amer: 40 mL/min — ABNORMAL LOW (ref >60)
Glucose, Bld: 106 mg/dL — ABNORMAL HIGH (ref 65–99)
Potassium: 3.4 mmol/L — ABNORMAL LOW (ref 3.5–5.1)
Sodium: 138 mmol/L (ref 135–145)

## 2016-07-17 LAB — GLUCOSE, CAPILLARY
Glucose-Capillary: 91 mg/dL (ref 65–99)
Glucose-Capillary: 110 mg/dL — ABNORMAL HIGH (ref 65–99)
Glucose-Capillary: 126 mg/dL — ABNORMAL HIGH (ref 65–99)
Glucose-Capillary: 128 mg/dL — ABNORMAL HIGH (ref 65–99)

## 2016-07-17 LAB — CBC
HCT: 27.8 % — ABNORMAL LOW (ref 36.0–46.0)
Hemoglobin: 8 g/dL — ABNORMAL LOW (ref 12.0–15.0)
MCH: 27.7 pg (ref 26.0–34.0)
MCHC: 28.8 g/dL — ABNORMAL LOW (ref 30.0–36.0)
MCV: 96.2 fL (ref 78.0–100.0)
Platelets: 366 10*3/uL (ref 150–400)
RBC: 2.89 MIL/uL — ABNORMAL LOW (ref 3.87–5.11)
RDW: 15.2 % (ref 11.5–15.5)
WBC: 9.7 10*3/uL (ref 4.0–10.5)

## 2016-07-17 MED ORDER — POTASSIUM CHLORIDE CRYS ER 20 MEQ PO TBCR
50.0000 meq | EXTENDED_RELEASE_TABLET | Freq: Two times a day (BID) | ORAL | Status: DC
  Administered 2016-07-17 – 2016-07-20 (×6): 50 meq via ORAL
  Filled 2016-07-17 (×7): qty 2

## 2016-07-17 NOTE — Clinical Social Work Note (Signed)
Clinical Social Work Assessment  Patient Details  Name: Tiffany [name] MRN: [id number] Date of Birth: Apr 20, 1960  Date of referral:  07/17/16               Reason for consult:  Facility Placement, Discharge Planning                Permission sought to share information with:  Chartered certified accountant granted to share information::  Yes, Verbal Permission Granted  Name::        Agency::  SNF's  Relationship::     Contact Information:     Housing/Transportation Living arrangements for the past 2 months:  Single Family Home Source of Information:  Patient, Medical Team Patient Interpreter Needed:  None Criminal Activity/Legal Involvement Pertinent to Current Situation/Hospitalization:  No - Comment as needed Significant Relationships:  Adult Children, Parents Lives with:  Adult Children Do you feel safe going back to the place where you live?  Yes Need for family participation in patient care:  Yes (Comment)  Care giving concerns:  PT recommending SNF once medically stable for discharge.   Social Worker assessment / plan:  [name] met with patient. No supports at bedside. [name] introduced role and explained that PT recommendations would be discussed. Patient is agreeable to SNF placement. No preference on facility. [name] provided SNF list for review. Patient asked about getting Medicaid if her hospital bill is a certain amount. [name] encouraged her to contact DSS. Patient expressed understanding. Patient's PASARR is pending. Patient cannot discharge to SNF until PASARR approved. No further concerns. [name] encouraged patient to contact [name] as needed. [name] will continue to follow patient for support and facilitate discharge to SNF once medically stable.  Employment status:  Disabled (Comment on whether or not currently receiving Disability) Insurance information:  Managed Medicare PT Recommendations:  [place] / Referral to community resources:  [place]  Patient/Family's Response to care:  Patient agreeable to SNF placement. Patient's family supportive and involved in patient's care. Patient appreciated social work intervention.  Patient/Family's Understanding of and Emotional Response to Diagnosis, Current Treatment, and Prognosis:  Patient has a good understanding of the reason for admission and her need for rehab prior to returning home. Patient appears happy with hospital care.  Emotional Assessment Appearance:  Appears stated age Attitude/Demeanor/Rapport:  Other (Pleasant) Affect (typically observed):  Accepting, Appropriate, Calm, Pleasant Orientation:  Oriented to Self, Oriented to Place, Oriented to Situation Alcohol / Substance use:  Never Used Psych involvement (Current and /or in the community):  No (Comment)  Discharge Needs  Concerns to be addressed:  Care Coordination Readmission within the last 30 days:  No Current discharge risk:  Dependent with Mobility Barriers to Discharge:  Continued Medical Work up, Programmer, applications ([place])   Candie Chroman, LCSW 07/17/2016, 4:42 PM

## 2016-07-17 NOTE — Progress Notes (Signed)
Saw [name] this afternoon.  She was sitting comfortably in her chair and reported that she was feeling well, with no SOB or pain.  [name] ready to go to SNF whenever a spot is found.  She was informed of our plan to restart her home dose of K-dur and continue her 40 mg torsemide and agreed with the plan.  She requested to speak with a social worker about her medicaid and her hospital bill, so I will look into that for her.

## 2016-07-17 NOTE — Progress Notes (Signed)
Family Medicine Teaching Service Daily Progress Note Intern Pager: [phone]  Patient name: Tiffany [name] Medical record number: [id number] Date of birth: [birth year] Age: 56 y.o. Gender: female  Primary Care Provider: [name], MD Consultants: Orthopedic Surgery Code Status: Full   Assessment and Plan: Tiffany [name] is a 56 y.o. female with a past medical history significant for CKDII, T2DM, HFpEF, Asthma/COPD, HLD, HTN, Bipolar 1 disorder, paranoid schizophrenia who presented today with right lower extremity pain and deformity and found to have a displaced proximal fibular fracture.    #Right displaced tibia/fibula fracture s/p closed reduction and intramedullary  Pain currently well controlled.  Orthopedic surgery recommend NWB with return in 2 weeks for suture removal. Patient will need SNF placement. Patient will be sent home with CAM walker.  Will work with PT.  - PT recommended SNF, [name] consulted  --Follow up Ortho recommendations   --Continue Hydrocodone-acetaminophen 1 tablet 5-325 mg q6 prn--Acetaminophen 650 mg q6 prn   #Hypokalemia- 3.4 this AM -Replete with home regimen of Kdur 50 mEq BID  #Fever s/p tib-fib fracture repair Fever x1 postop, patient otherwise has remained afebrile with stable vitals. CTA did not show any signs of  PE in the setting of increased oxygen requirement --Monitor vitals  --Consider starting antibiotics if clinically warranted   #Asthma/COPD, chronic respiratory failure, stable Patient is currently on 6L [place], used BiPAP overnight and tolerated well --Nocturnal Bipap post surgery, tolerating well  --Would recommend CPAP on discharge until outpatient sleep study performed  --Continue Home 6L oxygen as tolerated   --Continue Duoneb q6 prn  --Continue Breo Ellipta (Fluticasone furoate-vilanterol) 1 puff daily --Continue Singulair 10 mg qhs  --Continue Tiotropium 18 mcg daily   #HFpEF, Chronic Last ECHO on 3/20 showed EF 60-65% with  G2DD with normal wall motion. On exam this morning appears euvolemic.   UOP x24h 2550 ml . Weight down to 302 from 306 lb on admission.  Is approaching her dry weight of ~300 lbs.   --Continue torsemide 40 mg BID --Monitor I/O --Daily weights --Follow up on daily BMP --Continue aspirin 81 mg daily  #Acute on Chronic Kidney Dysfunction, chronic, resolving On admission creatinine was 1.4. Baseline appears to be around 1.1-1.2. This AM SCr was 1.41.   - Daily BMET to monitor  --Continue to hold Naproxen, Oxybutynin and Metformin  --Avoid nephrotoxic agents   #Cholecystostomy, chronic Drain was exchanged by IR and patient tolerated the procedure. Patient had good output and continues to drain well.  --Will continue to monitor  #Bipolar 1 disorder/Paranoid schizophrenia, chronic, stable Patient is followed by Dr. Berniece Andreas, previously at [facility]. Patient's last hospitalization was in Nov 2017 at [facility]. Last visit with psychiatrist on 6/14. Patient is on a extensive drug regimen that seem to be working for her. [name] level slightly sub therapeutic  --Will continue divalproex 1000mg  daily --Will continue escitalopram 20 mg daily --Will continue Mirtazapine 45 mg qhs --Will continue Ziprasidone 80 mg qhs --Will reduce Cogentin to 0.5 mg qhs  Per psych note  #Bilateral Knee pain, chronic Patient with history of bilateral knee pain likely secondary to osteoarthritis in the setting of morbid obesity. --Continue to hold naproxen, CKD II and worsening creatinine --Continue to hold Gabapentin 600 mg tid daily, could reduce dose in setting of falls.  #Hypertension  BP this am 141/92 . --Continue amlodipine 5 mg daily   #Hyperlipidemia Patient with HTN and HFpEF as well as T2DM though last A1c was 5.6. ASCVD based  on last lipid panel (12/04/2015) is 7.6%. --Will continue atorvastatin 40 mg daily   #Urinary incontinence, Patient with a history of overactive  bladder and urinary incontinence was started on oxybutynin by urology but asked to discontinue during previous hospitalizations . Given recent falls and polypharmacy with multiple meds with anticholinergic effect, will likely discontinue oxybutynin and start patient on Miragrebon  --Will Hold oxybutynin in the setting of AKI  --Consider starting patient on Miragrebon on discharge   #T2DM Patient with a diagnosis of T2DM on metformin, however last A1c was 5.6. On Admission , BG was within normal limit. --Will hold metformin in the setting of AKI  --Sensitive SSI --Monitor CBG qac/hs  #Obesity Followed by Dr.Skyes [facility] nutritionist). Patient has been trying to make changes to her diet and exercise plan for better weight control. --Will defer to PCP and outpatient nutritionist  #GERD --Protonix 40 mg daily  FEN/GI: Heart Healthy/ Carb modified Diet, NS @ KVO  PPx: Lovenox  Disposition: SNF pending medical stabilization   Subjective:  Patient notes her right leg pain is well controlled currently.  She denies shortness of breath and feels comfortable on 8L.  Agreeble to weaning to home oxygen today.  Feels less fluid overloaded.  No other concerns at this time.    Objective: Temp:  [98.2 F (36.8 C)-99 F (37.2 C)] 98.2 F (36.8 C) (07/02 0739) Pulse Rate:  [73-84] 74 (07/02 0739) Resp:  [13-26] 16 (07/02 0739) BP: (106-137)/(58-88) 132/82 (07/02 0739) SpO2:  [90 %-100 %] 93 % (07/02 4580)    Physical Exam:  General: Obese woman, appears comfortable, in no acute distress, [place] in place  Cardiac: RRR, no MRG  Respiratory: CTAB, no crackles or wheeze noted, comfortable work of breathing  Abdomen: soft, NTND, no hepatic or splenomegaly, +BS, cholecystotomy bag in place with bilious drainage in bag, no area of erythema noted Extremities: Right lower leg in boot, no edema over left leg Skin: warm and dry, no rashes noted  Neuro: alert and oriented x4, no focal deficits Psych: Normal  affect and mood  Laboratory:  Recent Labs Lab 07/15/16 0201 07/16/16 0249 07/17/16 0238  WBC 10.0 10.2 9.7  HGB 8.4* 8.2* 8.0*  HCT 29.0* 28.2* 27.8*  PLT 302 322 366    Recent Labs Lab 07/11/16 0532  07/15/16 0201 07/16/16 0249 07/17/16 0238  NA 141  < > 140 138 138  K 4.1  < > 3.8 3.1* 3.4*  CL 98*  < > 97* 93* 95*  CO2 33*  < > 32 37* 36*  BUN 18  < > 20 24* 24*  CREATININE 1.36*  < > 1.29* 1.46* 1.41*  CALCIUM 9.1  < > 8.8* 9.0 8.7*  PROT 6.3*  --   --   --   --   BILITOT 0.3  --   --   --   --   ALKPHOS 146*  --   --   --   --   ALT 31  --   --   --   --   AST 28  --   --   --   --   GLUCOSE 77  < > 88 94 106*  < > = values in this interval not displayed.  Imaging/Diagnostic Tests: Ct Angio Chest Pe W Or Wo Contrast  Result Date: 07/14/2016 CLINICAL DATA:  Shortness of breath today. EXAM: CT ANGIOGRAPHY CHEST WITH CONTRAST TECHNIQUE: Multidetector CT imaging of the chest was performed using the standard protocol  during bolus administration of intravenous contrast. Multiplanar CT image reconstructions and MIPs were obtained to evaluate the vascular anatomy. CONTRAST:  80 cc Isovue 370 COMPARISON:  Chest x-ray dated 07/15/2014 and chest CT dated 08/22/2015 FINDINGS: Cardiovascular: Satisfactory opacification of the pulmonary arteries to the segmental level. No evidence of pulmonary embolism. Cardiomegaly. No pericardial effusion. Mediastinum/Nodes: No enlarged mediastinal, hilar, or axillary lymph nodes. Thyroid gland, trachea, and esophagus demonstrate no significant findings. Lungs/Pleura: Hazy infiltrates in both upper lobes, particularly on the right and to a lesser degree in both lower lobes. This could represent pneumonitis or pulmonary edema. There are no pleural effusions however. Upper Abdomen: Normal. Musculoskeletal: No chest wall abnormality. No acute or significant osseous findings. Review of the MIP images confirms the above findings. IMPRESSION: 1. No  pulmonary emboli. 2. Hazy infiltrates in both lungs, most likely representing mild pulmonary edema. Is the patient febrile? No pleural effusions. 3. Cardiomegaly. Electronically Signed   By: Lorriane Shire M.D.   On: 07/14/2016 14:00   Dg Chest Port 1 View  Result Date: 07/14/2016 CLINICAL DATA:  Dyspnea and fever EXAM: PORTABLE CHEST 1 VIEW COMPARISON:  07/09/2016 FINDINGS: Vascular congestion is worse. Low volumes with vascular crowding and bibasilar atelectasis. Upper lungs clear. Central pulmonary edema is suspected. No pneumothorax pericardium. IMPRESSION: Above findings are most consistent with mild CHF and volume overload. Electronically Signed   By: Marybelle Killings M.D.   On: 07/14/2016 07:16   Winfrey, Alcario Drought, MD 07/17/2016, 9:58 AM PGY-1, Real Intern pager: [phone], text pages welcome

## 2016-07-17 NOTE — Progress Notes (Signed)
Physical Therapy Treatment Patient Details Name: Tiffany [name] MRN: [id number] DOB: 01/18/1960 Today's Date: 07/17/2016    History of Present Illness 56 Y/O F with PMX of Morbid obesity, DM2, CVA, Seizure,Paranoid, OSA on CPAP,manic depression, HTN,HLD, COPD,CHF, CKD, Cocaine abuse presented after a fall sustaining R tibia fracture. Now pt s/p R tibia closed reduction and IM nail on 6/27.    PT Comments    Pt is making slow progress towards her goals and is currently limited by fatigue and lack of strength needed to maintain R LE NWB. Pt maxAx2 for sit<>stand and is able to stand with min guard for 2 minutes while maintaining R LE NWB. Pt requires skilled PT to progress transfer training and begin gait training after improving strength and endurance to safely navigate in her discharge environment.     Follow Up Recommendations  SNF;Supervision/Assistance - 24 hour     Equipment Recommendations  Rolling walker with 5" wheels;Wheelchair (measurements PT);Wheelchair cushion (measurements PT);3in1 (PT);Other (comment) (bariatric walker)       Precautions / Restrictions Precautions Precautions: Fall Precaution Comments: watch O2 sats, cholecystostomy  Required Braces or Orthoses: Other Brace/Splint Other Brace/Splint: R CAM boot Restrictions Weight Bearing Restrictions: Yes RLE Weight Bearing: Non weight bearing    Mobility  Bed Mobility               General bed mobility comments: in recliner at entry  Transfers Overall transfer level: Needs assistance Equipment used: Rolling walker (2 wheeled) Transfers: Sit to/from [facility] Sit to Stand: Max assist;+2 physical assistance         General transfer comment: maxAx2 for attaining upright, pt able to maintain standing for 2 minutes while maintaining NWB through R LE before needing to sit down due to fatigue   Ambulation/Gait Ambulation/Gait assistance:  (pt lacks strength needed to advance LEs  maintaining R LE NWB)           General Gait Details: unable     Balance Overall balance assessment: Needs assistance Sitting-balance support: Feet supported;Single extremity supported Sitting balance-Leahy Scale: Fair     Standing balance support: Bilateral upper extremity supported Standing balance-Leahy Scale: Fair Standing balance comment: could maintain static standing with min guard A for 2 minutes while maintaining R LE NWB                            Cognition Arousal/Alertness: Awake/alert Behavior During Therapy: WFL for tasks assessed/performed Overall Cognitive Status: Within Functional Limits for tasks assessed                                        Exercises General Exercises - Lower Extremity Ankle Circles/Pumps: AROM;Left;10 reps;Seated Long Arc Quad: AROM;Left;10 reps;Seated Hip Flexion/Marching: AROM;Left;10 reps;Seated        Pertinent Vitals/Pain Pain Assessment: Faces Faces Pain Scale: Hurts even more Pain Location: RLE with dependant positioning Pain Descriptors / Indicators: Aching;Grimacing;Guarding;Throbbing;Tightness Pain Intervention(s): Monitored during session;Limited activity within patient's tolerance  Pt on 6 L O2 via HFNC. Pt maintained SaO2 above 90% O2 throughout PT session with vc for pursed lipped breathing            PT Goals (current goals can now be found in the care plan section) Acute Rehab PT Goals Patient Stated Goal: rehab before home PT Goal Formulation: With patient Time For Goal Achievement: 07/27/16  Potential to Achieve Goals: Good Progress towards PT goals: Progressing toward goals    Frequency    Min 3X/week      PT Plan Current plan remains appropriate       AM-PAC PT "6 Clicks" Daily Activity  Outcome Measure  Difficulty turning over in bed (including adjusting bedclothes, sheets and blankets)?: Total Difficulty moving from lying on back to sitting on the side of the  bed? : Total Difficulty sitting down on and standing up from a chair with arms (e.g., wheelchair, bedside commode, etc,.)?: Total Help needed moving to and from a bed to chair (including a wheelchair)?: Total Help needed walking in hospital room?: Total Help needed climbing 3-5 steps with a railing? : Total 6 Click Score: 6    End of Session Equipment Utilized During Treatment: Gait belt;Oxygen Activity Tolerance: Patient limited by fatigue Patient left: in chair;with call bell/phone within reach;with nursing/sitter in room Nurse Communication: Mobility status;Need for lift equipment PT Visit Diagnosis: Unsteadiness on feet (R26.81);Pain;History of falling (Z91.81);Muscle weakness (generalized) (M62.81) Pain - Right/Left: Right Pain - part of body: Leg;Ankle and joints of foot     Time: 5188-4166 PT Time Calculation (min) (ACUTE ONLY): 23 min  Charges:  $Therapeutic Exercise: 8-22 mins $Therapeutic Activity: 8-22 mins                    G Codes:       Nicholous Girgenti B. Migdalia Dk PT, DPT Acute Rehabilitation  [phone] Pager [phone]     [place] 07/17/2016, 2:21 PM

## 2016-07-17 NOTE — NC FL2 (Signed)
[place] LEVEL OF CARE SCREENING TOOL     IDENTIFICATION  Patient Name: Tiffany [name] Birthdate: Sep 29, 1960 Sex: female Admission Date (Current Location): 07/09/2016  [facility] and [place] Number:  Herbalist and Address:  [facility] [place] [address] [place], [place] [address]      Provider Number: [id number]  Attending Physician Name and Address:  Lind Covert, MD  Relative Name and Phone Number:       Current Level of Care: Hospital Recommended Level of Care: [place] Prior Approval Number:    Date Approved/Denied:   PASRR Number: Manual review  Discharge Plan: SNF    Current Diagnoses: Patient Active Problem List   Diagnosis Date Noted  . History of open reduction and internal fixation (ORIF) procedure   . Fracture tibia/fibula, right, closed, initial encounter   . Cholecystostomy care ([place])   . Pain   . Fibula fracture 07/10/2016  . Closed displaced transverse fracture of shaft of right tibia   . Fall   . Preoperative clearance   . Overactive bladder 06/07/2016  . Gallstones   . Upper abdominal pain   . Cholecystitis 05/09/2016  . QT prolongation   . OSA and COPD overlap syndrome ([place])   . Generalized weakness   . Arthritis   . Bipolar I disorder ([place])   . Hypokalemia 04/21/2016  . Wheezing 04/12/2016  . Acute heart failure ([place])   . Benign essential HTN 03/22/2016  . Rectal bleeding 12/31/2015  . Bipolar affective disorder, mixed, severe, with psychotic behavior ([place]) 11/28/2015  . Cocaine use disorder, severe, dependence ([place]) 11/28/2015  . Hyponatremia 11/25/2015  . AKI (acute kidney injury) ([place]) 11/24/2015  . Blood in sputum   . Chronic diastolic heart failure ([place])   . Dyspnea   . Chronic respiratory failure with hypoxia and hypercapnia ([place]) 06/22/2015  . Onychomycosis 01/21/2015  . Nasal congestion 12/17/2014  . Tobacco use disorder 07/22/2014  . COPD (chronic  obstructive pulmonary disease) ([place]) 07/08/2014  . Seborrheic keratoses 12/31/2013  . Knee pain, bilateral 01/22/2013  . Seizure ([place]) 01/04/2013  . Health care maintenance 11/25/2012  . Folliculitis 15/17/6160  . History of kidney stones 06/18/2012  . Chronic pain syndrome 06/18/2012  . Muscle spasm 06/18/2012  . Elevated troponin 04/28/2012  . Allergic reaction 04/07/2012  . Itching 09/06/2011  . HSV infection 08/30/2011  . Dyslipidemia 04/24/2011  . Anemia 04/24/2011  . Leukocytosis 04/24/2011  . Diabetic neuropathy ([place]) 04/24/2011  . Obstructive sleep apnea 10/18/2010  . Asthma 10/18/2010  . Morbid obesity ([place]) 10/18/2010  . Type 2 diabetes mellitus ([place]) 10/18/2010    Orientation RESPIRATION BLADDER Height & Weight     Self, Situation, Place  O2 (Nasal Canula 6 L) Bipap 14/6 at 60%. Incontinent, External catheter Weight: (!) 302 lb 7.5 oz (137.2 kg) Height:  5\' 4"  (162.6 cm)  BEHAVIORAL SYMPTOMS/MOOD NEUROLOGICAL BOWEL NUTRITION STATUS   (None) Convulsions/Seizures Continent Diet (Heart healthy/carb modified)  AMBULATORY STATUS COMMUNICATION OF NEEDS Skin   Extensive Assist Verbally Bruising, Surgical wounds, Other (Comment) (Catheter entry/exit.)                       Personal Care Assistance Level of Assistance  Bathing, Feeding, Dressing Bathing Assistance: Limited assistance Feeding assistance: Limited assistance Dressing Assistance: Limited assistance     Functional Limitations Info  Sight, Hearing, Speech Sight Info: Adequate Hearing Info: Adequate Speech Info: Adequate    SPECIAL  CARE FACTORS FREQUENCY  PT (By licensed PT), OT (By licensed OT)     PT Frequency: 5 x week OT Frequency: 5 x week            Contractures Contractures Info: Not present    Additional Factors Info  Code Status, Allergies, Psychotropic Code Status Info: Full Allergies Info: Hydroxyzine, Latuda (Lurasidone Hcl), Codeine, Sulfa antibiotics Psychotropic Info:  Bipolar Affective Disorder, Mixed, Severe, with psychotic features         Current Medications (07/17/2016):  This is the current hospital active medication list Current Facility-Administered Medications  Medication Dose Route Frequency Provider Last Rate Last Dose  . 0.9 %  sodium chloride infusion   Intravenous Continuous Lovenia Kim, MD 125 mL/hr at 07/12/16 2051    . acetaminophen (TYLENOL) tablet 650 mg  650 mg Oral Q6H PRN Leandrew Koyanagi, MD   650 mg at 07/14/16 0055   Or  . acetaminophen (TYLENOL) suppository 650 mg  650 mg Rectal Q6H PRN Leandrew Koyanagi, MD      . alum & mag hydroxide-simeth (MAALOX/MYLANTA) 200-200-20 MG/5ML suspension 30 mL  30 mL Oral Q4H PRN Leandrew Koyanagi, MD      . amLODipine (NORVASC) tablet 5 mg  5 mg Oral Daily Diallo, Abdoulaye, MD   5 mg at 07/17/16 0933  . aspirin EC tablet 81 mg  81 mg Oral Daily Diallo, Abdoulaye, MD   81 mg at 07/17/16 0933  . atorvastatin (LIPITOR) tablet 40 mg  40 mg Oral Daily Diallo, Abdoulaye, MD   40 mg at 07/17/16 0933  . benztropine (COGENTIN) tablet 0.5 mg  0.5 mg Oral q morning - 10a Riccio, Angela C, DO   0.5 mg at 07/17/16 0933  . chlorhexidine (PERIDEX) 0.12 % solution 15 mL  15 mL Mouth Rinse BID Lind Covert, MD   15 mL at 07/17/16 0932  . divalproex ([name] ER) 24 hr tablet 1,000 mg  1,000 mg Oral QHS Leandrew Koyanagi, MD   1,000 mg at 07/16/16 2207  . enoxaparin (LOVENOX) injection 40 mg  40 mg Subcutaneous Q24H Leandrew Koyanagi, MD   40 mg at 07/17/16 0818  . escitalopram (LEXAPRO) tablet 20 mg  20 mg Oral Daily Diallo, Abdoulaye, MD   20 mg at 07/17/16 0933  . fluticasone furoate-vilanterol (BREO ELLIPTA) 100-25 MCG/INH 1 puff  1 puff Inhalation Daily Diallo, Abdoulaye, MD   1 puff at 07/17/16 0812  . gabapentin (NEURONTIN) tablet 600 mg  600 mg Oral TID Rogue Bussing, MD   600 mg at 07/17/16 2778  . HYDROcodone-acetaminophen (NORCO/VICODIN) 5-325 MG per tablet 1 tablet  1 tablet Oral Q6H PRN Lucila Maine  C, DO   1 tablet at 07/17/16 0933  . insulin aspart (novoLOG) injection 0-9 Units  0-9 Units Subcutaneous TID WC Diallo, Abdoulaye, MD   1 Units at 07/16/16 1659  . ipratropium-albuterol (DUONEB) 0.5-2.5 (3) MG/3ML nebulizer solution 3 mL  3 mL Nebulization Q4H PRN Erick Colace, NP      . lactated ringers infusion   Intravenous Continuous [name] Riggers, MD 10 mL/hr at 07/12/16 1306    . MEDLINE mouth rinse  15 mL Mouth Rinse q12n4p Chambliss, Marshall L, MD   15 mL at 07/16/16 1700  . menthol-cetylpyridinium (CEPACOL) lozenge 3 mg  1 lozenge Oral PRN Leandrew Koyanagi, MD       Or  . phenol (CHLORASEPTIC) mouth spray 1 spray  1 spray Mouth/Throat PRN Leandrew Koyanagi,  MD      . methocarbamol (ROBAXIN) tablet 500 mg  500 mg Oral Q6H PRN Leandrew Koyanagi, MD       Or  . methocarbamol (ROBAXIN) 500 mg in dextrose 5 % 50 mL IVPB  500 mg Intravenous Q6H PRN Leandrew Koyanagi, MD      . metoCLOPramide (REGLAN) tablet 5-10 mg  5-10 mg Oral Q8H PRN Leandrew Koyanagi, MD       Or  . metoCLOPramide (REGLAN) injection 5-10 mg  5-10 mg Intravenous Q8H PRN Leandrew Koyanagi, MD      . mirtazapine (REMERON) tablet 45 mg  45 mg Oral QHS Diallo, Abdoulaye, MD   45 mg at 07/16/16 2206  . montelukast (SINGULAIR) tablet 10 mg  10 mg Oral QHS Diallo, Abdoulaye, MD   10 mg at 07/16/16 2206  . naloxone [facility]) injection 0.4 mg  0.4 mg Intravenous PRN Lind Covert, MD   0.4 mg at 07/14/16 0050  . ondansetron (ZOFRAN) tablet 4 mg  4 mg Oral Q6H PRN Leandrew Koyanagi, MD       Or  . ondansetron [facility]) injection 4 mg  4 mg Intravenous Q6H PRN Leandrew Koyanagi, MD      . pantoprazole (PROTONIX) EC tablet 40 mg  40 mg Oral Daily Diallo, Abdoulaye, MD   40 mg at 07/17/16 0933  . polyethylene glycol (MIRALAX / GLYCOLAX) packet 17 g  17 g Oral Daily Diallo, Abdoulaye, MD   17 g at 07/16/16 0919  . potassium chloride (K-DUR,KLOR-CON) CR tablet 50 mEq  50 mEq Oral BID Verner Mould, MD   50 mEq at 07/17/16 1403  . senna  (SENOKOT) tablet 8.6 mg  1 tablet Oral Daily Diallo, Abdoulaye, MD   8.6 mg at 07/16/16 0920  . tiotropium (SPIRIVA) inhalation capsule 18 mcg  18 mcg Inhalation Daily Diallo, Abdoulaye, MD   18 mcg at 07/17/16 0812  . torsemide (DEMADEX) tablet 40 mg  40 mg Oral BID Diallo, Abdoulaye, MD   40 mg at 07/17/16 0819  . ziprasidone (GEODON) capsule 80 mg  80 mg Oral QHS Diallo, Abdoulaye, MD   80 mg at 07/16/16 2207     Discharge Medications: Please see discharge summary for a list of discharge medications.  Relevant Imaging Results:  Relevant Lab Results:   Additional Information SS#: [id number]. Has a gal drain that she's had since April.  Candie Chroman, LCSW

## 2016-07-17 NOTE — Clinical Social Work Placement (Signed)
   CLINICAL SOCIAL WORK PLACEMENT  NOTE  Date:  07/17/2016  Patient Details  Name: Tiffany [name] MRN: [id number] Date of Birth: 1960-04-17  Clinical Social Work is seeking post-discharge placement for this patient at the [place] level of care (*[name] will initial, date and re-position this form in  chart as items are completed):  Yes   Patient/family provided with [facility] Work Department's list of facilities offering this level of care within the geographic area requested by the patient (or if unable, by the patient's family).  Yes   Patient/family informed of their freedom to choose among providers that offer the needed level of care, that participate in Medicare, Medicaid or managed care program needed by the patient, have an available bed and are willing to accept the patient.  Yes   Patient/family informed of [facility]'s ownership interest in [facility] and [facility], as well as of the fact that they are under no obligation to receive care at these facilities.  PASRR submitted to EDS on 07/17/16     PASRR number received on       Existing PASRR number confirmed on       FL2 transmitted to all facilities in geographic area requested by pt/family on 07/17/16     FL2 transmitted to all facilities within larger geographic area on       Patient informed that his/her managed care company has contracts with or will negotiate with certain facilities, including the following:            Patient/family informed of bed offers received.  Patient chooses bed at       Physician recommends and patient chooses bed at      Patient to be transferred to   on  .  Patient to be transferred to facility by       Patient family notified on   of transfer.  Name of family member notified:        PHYSICIAN Please sign FL2     Additional Comment:    _______________________________________________ Candie Chroman, LCSW 07/17/2016, 4:46 PM

## 2016-07-18 LAB — CBC
HCT: 30.1 % — ABNORMAL LOW (ref 36.0–46.0)
Hemoglobin: 8.6 g/dL — ABNORMAL LOW (ref 12.0–15.0)
MCH: 27.8 pg (ref 26.0–34.0)
MCHC: 28.6 g/dL — ABNORMAL LOW (ref 30.0–36.0)
MCV: 97.4 fL (ref 78.0–100.0)
Platelets: 413 10*3/uL — ABNORMAL HIGH (ref 150–400)
RBC: 3.09 MIL/uL — ABNORMAL LOW (ref 3.87–5.11)
RDW: 15.2 % (ref 11.5–15.5)
WBC: 9.2 10*3/uL (ref 4.0–10.5)

## 2016-07-18 LAB — BASIC METABOLIC PANEL
Anion gap: 7 (ref 5–15)
BUN: 22 mg/dL — ABNORMAL HIGH (ref 6–20)
CO2: 39 mmol/L — ABNORMAL HIGH (ref 22–32)
Calcium: 9.2 mg/dL (ref 8.9–10.3)
Chloride: 97 mmol/L — ABNORMAL LOW (ref 101–111)
Creatinine, Ser: 1.27 mg/dL — ABNORMAL HIGH (ref 0.44–1.00)
GFR calc Af Amer: 53 mL/min — ABNORMAL LOW (ref >60)
GFR calc non Af Amer: 46 mL/min — ABNORMAL LOW (ref >60)
Glucose, Bld: 92 mg/dL (ref 65–99)
Potassium: 4.2 mmol/L (ref 3.5–5.1)
Sodium: 143 mmol/L (ref 135–145)

## 2016-07-18 LAB — GLUCOSE, CAPILLARY
Glucose-Capillary: 86 mg/dL (ref 65–99)
Glucose-Capillary: 67 mg/dL (ref 65–99)
Glucose-Capillary: 115 mg/dL — ABNORMAL HIGH (ref 65–99)
Glucose-Capillary: 121 mg/dL — ABNORMAL HIGH (ref 65–99)

## 2016-07-18 NOTE — Care Management Important Message (Signed)
Important Message  Patient Details  Name: Tiffany [name] MRN: [id number] Date of Birth: May 22, 1960   Medicare Important Message Given:  Yes    [name] Abena 07/18/2016, 10:29 AM

## 2016-07-18 NOTE — Clinical Social Work Note (Addendum)
[name] met with patient. Mother at bedside. [name] provided bed offers: [facility], [facility], and [facility]. Patient states that she definitely does not want [facility] and [facility] is too far away. Patient chooses [facility]. Her mother and son will tour the facility later today. Per admissions coordinator, they will not have a bed available until Thursday. Patient refuses to consider the other two options as a second preference SNF. [name] faxed clinicals to [facility] to start insurance authorization. PASARR is still pending.  Dayton Scrape, [name] [phone]  2:06 pm H&P, FL2, and 30 day note faxed to [place] [facility] for review for PASARR.  Dayton Scrape, [place]  4:34 pm [name] gave patient updated list of bed offers. There are two extra offers: [facility] and [facility].   Dayton Scrape, [place]

## 2016-07-18 NOTE — Progress Notes (Signed)
Patient states that she is doing well and her breathing is doing well. Patient denies SOB. She has agreed to SNF placement following d/c.  Caroline More, DO 07/18/2016, 3:09 PM PGY-1, [place] Medicine Service pager [phone]

## 2016-07-18 NOTE — Progress Notes (Signed)
OT Cancellation Note  Patient Details Name: Tiffany [name] MRN: [id number] DOB: 01-29-1960   Cancelled Treatment:    Reason Eval/Treat Not Completed: Pt talking on phone and refused OT this morning.  Will reattempt as able.  Jaysha Lasure [place], OTR/L [phone]   Lucille Passy M 07/18/2016, 10:47 AM

## 2016-07-18 NOTE — Progress Notes (Signed)
Family Medicine Teaching Service Daily Progress Note Intern Pager: [phone]  Patient name: Tiffany [name] Medical record number: [id number] Date of birth: 1960-10-16 Age: 56 y.o. Gender: female  Primary Care Provider: [name], MD Consultants: Orthopedic surgery  Code Status: Full  Pt Overview and Major Events to Date:  Tiffany [name] is a 56 yo female presenting for mid shaft tib/fib fraction secondary to mechanical fall s/p surgical fixation on 6/27. She was admitted to the [facility] on 07/10/16.   Assessment and Plan:  1. Right displaced tibia/fibula fracture s/p closed reduction and intramedullary  Orthopedic recommendations of NWB operative extremity and need for SNF placement. Will need f/u with orthopedics in 2 weeks for suture removal. Patient reports pain is improved. PT recommendations of SNF care and rolling walker.  -d/c with CAM walker and NWB -continue PT -will appreciate ortho recommendations  -continue hydrocodone-acetaminophen 1 tablet 5-325 mg q6 prn and acetaminophen 650 mg q6 prn for pain   2. Hypokalemia-resolved  Current potassium is 4.2, improved from 3.4 on 07/17/16  3. Fever s/p tib-fib fracture repair Fever x1 postop. Patient currently is afebrile with temperature of 98.4. Vitals remain stable with BP 127/76, P 78, R 18. CTA showed no evidence of pulmonary embolism.  -continue to monitor vital signs  -consider antibiotics if clinically warranted   4. Asthma/COPD, chronic respiratory failure-stable Patient currently on home dose of 6L/min O2 via nasal canula. Continues BiPAP overnight and tolerates well.  -Continue BiPAP at night  -Consider d/c with CPAP and outpatient sleep study  -Continue home 6L oxygen, will d/c on 6L via nasal canula   -Continue duoneb q4 hrs prn  -Continue Breo Ellipta 1 puff daily  -Continue Singulair 10 mg qhs -Continue Tiotropium 18 mcg daily   5. HFpEF-Chronic LastECHO on 3/20 showed EF 60-65% with G2DD with normal wall  motion. On exam appears euvolemic UOP x24 hrs -900 ml.  -Continue torsemide 40 mg BID -monitor I's and O's -Daily weights  -monitor BMP -continue aspirin 81 mg daily   6. Acute on Chronic Kidney Dysfunction-chronic, resolving Creatinine 1.27. Cr on 07/17/16 was 1.41. Baseline appears to be around 1.1-1.2 -Avoid nephrotoxic drugs -continue to monitor   7. Cholecystostomy-chronic Drain was exchanged by IR and patient tolerated the procedure. Patient had good output and continues to drain well.  -continue to monitor  8. Bipolar 1 disorder/Paranoid schizophrenia, chronic, stable Patient is followed by Dr. Berniece Andreas, previously at [facility]. Patient's last hospitalization was in Nov 2017 at [facility]. Last visit with psychiatrist on 6/14. Patient is on a extensive drug regimen that seem to be working for her. [name] level slightly sub therapeutic  -Will continue divalproex 1000mg  daily -Will continue escitalopram 20 mg daily -Will continue Mirtazapine 45 mg qhs -Will continue Ziprasidone 80 mg qhs -Will reduce Cogentin to 0.5 mg qhs Per psych note  9. Bilateral Knee pain, chronic Patient with history of bilateral knee pain likely secondary to osteoarthritis in the setting of morbid obesity. -Continue to hold naproxen, CKD II and worsening creatinine  10. Hypertension BP currently 127/76 -continue to monitor   FEN/GI: Heart healthy/carb modified diet PPx: Lovenox   Disposition: Patient is clinically improved. Able to be d/c pending SNF placement   Subjective:  Tiffany [name] is a 56 yo female presenting for mid shaft tib/fib fraction secondary to mechanical fall s/p surgical fixation on 6/2. Patient has a PMH significant for past medical history significant for CKDII, T2DM, HFpEF, Asthma/COPD, HLD, HTN, Bipolar 1 disorder,  paranoid schizophrenia. Patient history and ROS was difficult to obtain due to her falling asleep, but patient stated she was just tired and in no  distress. Patient reports some pain in her Right leg but overall feels better. Patient reports breathing is improved. Patient is urinated adequately.    Objective: Temp:  [98.2 F (36.8 C)-98.7 F (37.1 C)] 98.4 F (36.9 C) (07/03 0333) Pulse Rate:  [71-88] 71 (07/03 0333) Resp:  [14-21] 21 (07/03 0333) BP: (110-141)/(57-94) 126/70 (07/03 0333) SpO2:  [93 %-97 %] 97 % (07/03 0333) Physical Exam: General: NAD, Patient is an obese female, appears comfortable Cardiovascular: RRR, no MRG Respiratory: CTAB, no increased work of breathing Abdomen: soft, non tender, non distended, bowel sounds heard in all four quadrants  Extremities: no edema   Laboratory:  Recent Labs Lab 07/16/16 0249 07/17/16 0238 07/18/16 0236  WBC 10.2 9.7 9.2  HGB 8.2* 8.0* 8.6*  HCT 28.2* 27.8* 30.1*  PLT 322 366 413*    Recent Labs Lab 07/16/16 0249 07/17/16 0238 07/18/16 0236  NA 138 138 143  K 3.1* 3.4* 4.2  CL 93* 95* 97*  CO2 37* 36* 39*  BUN 24* 24* 22*  CREATININE 1.46* 1.41* 1.27*  CALCIUM 9.0 8.7* 9.2  GLUCOSE 94 106* 92     Imaging/Diagnostic Tests: CT Angio Chest PE w/ or w/out contrast Result Date: 07/14/2016 CLINICAL DATA:  Shortness of breath today. EXAM: CT ANGIOGRAPHY CHEST WITH CONTRAST TECHNIQUE: Multidetector CT imaging of the chest was performed using the standard protocol during bolus administration of intravenous contrast. Multiplanar CT image reconstructions and MIPs were obtained to evaluate the vascular anatomy. CONTRAST:  80 cc Isovue 370 COMPARISON:  Chest x-ray dated 07/15/2014 and chest CT dated 08/22/2015 FINDINGS: Cardiovascular: Satisfactory opacification of the pulmonary arteries to the segmental level. No evidence of pulmonary embolism. Cardiomegaly. No pericardial effusion. Mediastinum/Nodes: No enlarged mediastinal, hilar, or axillary lymph nodes. Thyroid gland, trachea, and esophagus demonstrate no significant findings. Lungs/Pleura: Hazy infiltrates in both upper  lobes, particularly on the right and to a lesser degree in both lower lobes. This could represent pneumonitis or pulmonary edema. There are no pleural effusions however. Upper Abdomen: Normal. Musculoskeletal: No chest wall abnormality. No acute or significant osseous findings. Review of the MIP images confirms the above findings. IMPRESSION: 1. No pulmonary emboli. 2. Hazy infiltrates in both lungs, most likely representing mild pulmonary edema. Is the patient febrile? No pleural effusions. 3. Cardiomegaly. Electronically Signed   By: Lorriane Shire M.D.   On: 07/14/2016 14:00   Dg Chest Port 1 View Result Date: 07/14/2016 CLINICAL DATA:  Dyspnea and fever EXAM: PORTABLE CHEST 1 VIEW COMPARISON:  07/09/2016 FINDINGS: Vascular congestion is worse. Low volumes with vascular crowding and bibasilar atelectasis. Upper lungs clear. Central pulmonary edema is suspected. No pneumothorax pericardium. IMPRESSION: Above findings are most consistent with mild CHF and volume overload. Electronically Signed   By: Marybelle Killings M.D.   On: 07/14/2016 07:16   DG Tibia/Fibula Right Port CLINICAL DATA:  Postop fracture fixation. EXAM: PORTABLE RIGHT TIBIA AND FIBULA - 2 VIEW COMPARISON:  07/09/2016 FINDINGS: Interval fixation of the mid tibia fracture with a intramedullary rod along with 2 proximal and 2 distal interlocking screws. Good position and alignment without complicating features. There is also improved position and alignment of the proximal fibular shaft fracture. One half shaft width of medial displacement. The knee and ankle joints are maintained. IMPRESSION: Internal fixation of mid tibia fracture with an intramedullary rod. Anatomic alignment without  complicating features. Improved position and alignment of the proximal fibular shaft fracture. Electronically Signed   By: Marijo Sanes M.D.   On: 07/12/2016 17:05  DG Tibia/Fibula Right CLINICAL DATA:  Fracture after a fall. EXAM: DG C-ARM  61-120 MIN; RIGHT TIBIA AND FIBULA - 2 VIEW COMPARISON:  None. FINDINGS: Open reduction internal fixation of the tibial fracture with intramedullary rod. Satisfactory position and alignment. IMPRESSION: As above. Electronically Signed   By: Staci Righter M.D.   On: 07/12/2016 16:06  DG C-Arm 1-60 min CLINICAL DATA:  Fracture after a fall. EXAM: DG C-ARM 61-120 MIN; RIGHT TIBIA AND FIBULA - 2 VIEW COMPARISON:  None. FINDINGS: Open reduction internal fixation of the tibial fracture with intramedullary rod. Satisfactory position and alignment. IMPRESSION: As above. Electronically Signed   By: Staci Righter M.D.   On: 07/12/2016 16:06  Caroline More, DO 07/18/2016, 6:31 AM PGY-1, [place] Intern pager: [phone], text pages welcome

## 2016-07-18 NOTE — Consult Note (Addendum)
   [facility] CM Inpatient Consult   07/18/2016  BETHANN QUALLEY 1960-09-04 [id number]    Ms. Lewan is active with [facility] program. Please see chart review tab then encounters for further patient outreach details from [facility] team  Chart reviewed and noted patient is now agreeable to SNF.   Will update [facility] RNCM and request appropriate referral be made to [facility] LCSW if patient does indeed go to SNF.  Voicemail left for inpatient LCSW to make aware [facility] is active and to receive update on SNF status.  Addendum: Spoke with [facility] RNCM. Writer will proceed with making [facility] LCSW referral  for SNF follow up should she go.  Marthenia Rolling, MSN-Ed, RN,BSN [facility] Liaison [phone]

## 2016-07-19 LAB — CBC
HCT: 35.1 % — ABNORMAL LOW (ref 36.0–46.0)
Hemoglobin: 10.1 g/dL — ABNORMAL LOW (ref 12.0–15.0)
MCH: 28.5 pg (ref 26.0–34.0)
MCHC: 28.8 g/dL — ABNORMAL LOW (ref 30.0–36.0)
MCV: 99.2 fL (ref 78.0–100.0)
Platelets: 469 10*3/uL — ABNORMAL HIGH (ref 150–400)
RBC: 3.54 MIL/uL — ABNORMAL LOW (ref 3.87–5.11)
RDW: 15.2 % (ref 11.5–15.5)
WBC: 9 10*3/uL (ref 4.0–10.5)

## 2016-07-19 LAB — BASIC METABOLIC PANEL
Anion gap: 10 (ref 5–15)
BUN: 17 mg/dL (ref 6–20)
CO2: 38 mmol/L — ABNORMAL HIGH (ref 22–32)
Calcium: 9.5 mg/dL (ref 8.9–10.3)
Chloride: 96 mmol/L — ABNORMAL LOW (ref 101–111)
Creatinine, Ser: 1.16 mg/dL — ABNORMAL HIGH (ref 0.44–1.00)
GFR calc Af Amer: 59 mL/min — ABNORMAL LOW (ref >60)
GFR calc non Af Amer: 51 mL/min — ABNORMAL LOW (ref >60)
Glucose, Bld: 89 mg/dL (ref 65–99)
Potassium: 4.1 mmol/L (ref 3.5–5.1)
Sodium: 144 mmol/L (ref 135–145)

## 2016-07-19 LAB — GLUCOSE, CAPILLARY
Glucose-Capillary: 66 mg/dL (ref 65–99)
Glucose-Capillary: 140 mg/dL — ABNORMAL HIGH (ref 65–99)
Glucose-Capillary: 111 mg/dL — ABNORMAL HIGH (ref 65–99)
Glucose-Capillary: 106 mg/dL — ABNORMAL HIGH (ref 65–99)
Glucose-Capillary: 124 mg/dL — ABNORMAL HIGH (ref 65–99)

## 2016-07-19 LAB — CULTURE, BLOOD (ROUTINE X 2)
Culture: NO GROWTH
Culture: NO GROWTH
Special Requests: ADEQUATE
Special Requests: ADEQUATE

## 2016-07-19 MED ORDER — GERHARDT'S BUTT CREAM
TOPICAL_CREAM | Freq: Two times a day (BID) | CUTANEOUS | Status: DC
  Administered 2016-07-20: 10:00:00 via TOPICAL
  Filled 2016-07-19: qty 1

## 2016-07-19 NOTE — Progress Notes (Signed)
Drain in the RUQ of the abdomen seems to have been displaced. MD aware per day shift RN

## 2016-07-19 NOTE — Progress Notes (Signed)
Physical Therapy Treatment Patient Details Name: Tiffany [name] MRN: [id number] DOB: 1961/01/01 Today's Date: 07/19/2016    History of Present Illness 57 Y/O F with PMX of Morbid obesity, DM2, CVA, Seizure,Paranoid, OSA on CPAP,manic depression, HTN,HLD, COPD,CHF, CKD, Cocaine abuse presented after a fall sustaining R tibia fracture. Now pt s/p R tibia closed reduction and IM nail on 6/27.    PT Comments    Pt is making progress towards her goals today. Pt currently minA for bed mobility and modAx2 for transfer using Stedy. Pt did well with Stedy and demonstrated enough strength in L LE to transfer with RW in next session. Pt requires skilled PT to progress transfer and gait training and to improve LE strength and endurance to safely mobilize in her discharge environment.   Follow Up Recommendations  SNF;Supervision/Assistance - 24 hour     Equipment Recommendations  Rolling walker with 5" wheels;Wheelchair (measurements PT);Wheelchair cushion (measurements PT);3in1 (PT);Other (comment)    Recommendations for Other Services       Precautions / Restrictions Precautions Precautions: Fall Precaution Comments: watch O2 sats, cholecystostomy  Required Braces or Orthoses: Other Brace/Splint Other Brace/Splint: R CAM boot Restrictions Weight Bearing Restrictions: Yes RLE Weight Bearing: Non weight bearing    Mobility  Bed Mobility Overal bed mobility: Needs Assistance Bed Mobility: Supine to Sit     Supine to sit: Min assist     General bed mobility comments: Min a for Rt LE   Transfers Overall transfer level: Needs assistance Equipment used: Ambulation equipment used Transfers: Sit to/from [facility] Sit to Stand: Mod assist;+2 physical assistance Stand pivot transfers: Min assist;+2 physical assistance       General transfer comment: Pt requires assist to power up into standing and for Rt LE management       Balance Overall balance assessment:  Needs assistance Sitting-balance support: Feet supported Sitting balance-Leahy Scale: Fair     Standing balance support: Bilateral upper extremity supported Standing balance-Leahy Scale: Poor Standing balance comment: reliant on bil. UE support                             Cognition Arousal/Alertness: Awake/alert Behavior During Therapy: WFL for tasks assessed/performed Overall Cognitive Status: Within Functional Limits for tasks assessed                                           General Comments General comments (skin integrity, edema, etc.): VSS.  Pt on 5L supplemental 02       Pertinent Vitals/Pain Pain Assessment: Faces Faces Pain Scale: Hurts even more Pain Location: RLE with dependant positioning Pain Descriptors / Indicators: Aching;Grimacing;Guarding;Throbbing;Tightness Pain Intervention(s): Monitored during session;Premedicated before session;Repositioned           PT Goals (current goals can now be found in the care plan section) Acute Rehab PT Goals PT Goal Formulation: With patient Time For Goal Achievement: 07/27/16 Potential to Achieve Goals: Good Progress towards PT goals: Progressing toward goals    Frequency    Min 3X/week      PT Plan Current plan remains appropriate    Co-evaluation   Reason for Co-Treatment: Complexity of the patient's impairments (multi-system involvement) PT goals addressed during session: Mobility/safety with mobility OT goals addressed during session: ADL's and self-care      AM-PAC PT "6 Clicks" Daily Activity  Outcome Measure  Difficulty turning over in bed (including adjusting bedclothes, sheets and blankets)?: Total Difficulty moving from lying on back to sitting on the side of the bed? : Total Difficulty sitting down on and standing up from a chair with arms (e.g., wheelchair, bedside commode, etc,.)?: Total Help needed moving to and from a bed to chair (including a wheelchair)?:  Total Help needed walking in hospital room?: Total Help needed climbing 3-5 steps with a railing? : Total 6 Click Score: 6    End of Session Equipment Utilized During Treatment: Gait belt;Oxygen Activity Tolerance: Patient limited by fatigue Patient left: in chair;with call bell/phone within reach Nurse Communication: Mobility status;Need for lift equipment PT Visit Diagnosis: Unsteadiness on feet (R26.81);Pain;History of falling (Z91.81);Muscle weakness (generalized) (M62.81) Pain - Right/Left: Right Pain - part of body: Leg;Ankle and joints of foot     Time: 1411-1455 PT Time Calculation (min) (ACUTE ONLY): 44 min  Charges:  $Therapeutic Activity: 23-37 mins                    G Codes:       Yarethzy Croak B. Migdalia Dk PT, DPT Acute Rehabilitation  [phone] Pager [phone]     [place] 07/19/2016, 3:35 PM

## 2016-07-19 NOTE — Progress Notes (Signed)
Paged Night Coverage for FMTS regarding pt's biliary drain appearing malpositioned and pt endorsing "it feels different". Sutures are not attached to her skin. Page returned and night coverage will convey to Day Attending.

## 2016-07-19 NOTE — Progress Notes (Addendum)
Family Medicine Teaching Service Daily Progress Note Intern Pager: [phone]  Patient name: Tiffany [name] Medical record number: [id number] Date of birth: 1960-07-05 Age: 56 y.o. Gender: female  Primary Care Provider: [name], MD Consultants: Orthopedic surgery  Code Status: Full  Pt Overview and Major Events to Date:  Tiffany [name] is a 56 yo female presenting for mid shaft tib/fib fraction secondary to mechanical fall s/p surgical fixation on 6/27. She was admitted to the [facility] on 07/10/16.   Assessment and Plan:  1. Right displaced tibia/fibula fracture s/p closed reduction and intramedullary  Orthopedic recommendations of NWB operative extremity and need for SNF placement. Will need f/u with orthopedics in 2 weeks for suture removal. Patient reports pain is improved. PT recommendations of SNF care and rolling walker.  -d/c with CAM walker and NWB -continue PT -will appreciate ortho recommendations  -continue hydrocodone-acetaminophen 1 tablet 5-325 mg q6 prn and acetaminophen 650 mg q6 prn for pain   2. Hypokalemia-resolved  Potassium on 7/4 is 4.1  3. Fever s/p tib-fib fracture repair Fever x1 postop. Patient currently is afebrile with temperature of 98.4. Vitals remain stable with BP 127/76, P 78, R 18. CTA showed no evidence of pulmonary embolism.  -continue to monitor vital signs  -consider antibiotics if clinically warranted   4. Asthma/COPD, chronic respiratory failure-stable Patient currently on home dose of 6L/min O2 via nasal canula. Continues BiPAP overnight and tolerates well.  -Continue BiPAP at night  -Consider d/c with CPAP and outpatient sleep study  -Continue home 6L oxygen, will d/c on 6L via nasal canula   -Continue duoneb q4 hrs prn  -Continue Breo Ellipta 1 puff daily  -Continue Singulair 10 mg qhs -Continue Tiotropium 18 mcg daily   5. HFpEF-Chronic LastECHO on 3/20 showed EF 60-65% with G2DD with normal wall motion. On exam appears  euvolemic UOP x24 hrs -4 L.  -Continue torsemide 40 mg BID -monitor I's and O's -Daily weights  -monitor BMP -continue aspirin 81 mg daily   6. Acute on Chronic Kidney Dysfunction-chronic, resolving Creatinine improving 1.41 --> 1.27 --> 1.16.  Baseline appears to be around 1.1-1.2 -Avoid nephrotoxic drugs -continue to monitor   7. Cholecystostomy-chronic Drain was exchanged by IR and patient tolerated the procedure. Patient had good output and continues to drain well.  -malpositioned this am; sutures appear to have become detached from skin, still draining well.  Left message with IR asking for their reassessment -continue to monitor  8. Bipolar 1 disorder/Paranoid schizophrenia, chronic, stable Patient is followed by Dr. Berniece Andreas, previously at [facility]. Patient's last hospitalization was in Nov 2017 at [facility]. Last visit with psychiatrist on 6/14. Patient is on a extensive drug regimen that seem to be working for her. [name] level slightly sub therapeutic  -Will continue divalproex 1000mg  daily -Will continue escitalopram 20 mg daily -Will continue Mirtazapine 45 mg qhs -Will continue Ziprasidone 80 mg qhs -Will reduce Cogentin to 0.5 mg qhs Per psych note  9. Bilateral Knee pain, chronic Patient with history of bilateral knee pain likely secondary to osteoarthritis in the setting of morbid obesity. -Continue to hold naproxen, CKD II and worsening creatinine  10. Hypertension BP currently 120/63 -continue to monitor   FEN/GI: Heart healthy/carb modified diet PPx: Lovenox   Disposition: Patient is clinically improved. Able to be d/c pending SNF placement.  Encouraging patient to consider SNFs that can accept her today, 7/4.  However, patient is reticent to leave the hospital, saying that "she is not  ready" and that her son needs more time to look at all of the SNF's that she is considering.  Subjective:  Tiffany [name] is a 55 yo female presenting for  mid shaft tib/fib fraction secondary to mechanical fall s/p surgical fixation on 6/2. Patient has a PMH significant for past medical history significant for CKDII, T2DM, HFpEF, Asthma/COPD, HLD, HTN, Bipolar 1 disorder, paranoid schizophrenia.  Patient reports some pain in her Right leg and discomfort with her boot but overall feels better. Patient reports breathing is improved. Patient is urinated adequately.  She says that she can feel the fluid flushes in her cholecystostomy site, which is new for her, and she thinks that the tubing has become malpositioned.  Objective: Temp:  [98.1 F (36.7 C)-98.8 F (37.1 C)] 98.2 F (36.8 C) (07/04 0347) Pulse Rate:  [67-84] 67 (07/04 0347) Resp:  [14-24] 17 (07/04 0347) BP: (120-140)/(63-89) 120/63 (07/04 0347) SpO2:  [93 %-100 %] 98 % (07/04 0347) FiO2 (%):  [60 %] 60 % (07/04 0347) Physical Exam: General: NAD, Patient is an obese female, appears comfortable but is irritated Cardiovascular: RRR, no MRG Respiratory: CTAB, no increased work of breathing Abdomen: soft, non tender, non distended, bowel sounds heard in all four quadrants. Cholecystostomy bag is draining dark brown fluid, sutures appear detached from skin Extremities: no edema    Laboratory:  Recent Labs Lab 07/17/16 0238 07/18/16 0236 07/19/16 0525  WBC 9.7 9.2 9.0  HGB 8.0* 8.6* 10.1*  HCT 27.8* 30.1* 35.1*  PLT 366 413* 469*    Recent Labs Lab 07/17/16 0238 07/18/16 0236 07/19/16 0525  NA 138 143 144  K 3.4* 4.2 4.1  CL 95* 97* 96*  CO2 36* 39* 38*  BUN 24* 22* 17  CREATININE 1.41* 1.27* 1.16*  CALCIUM 8.7* 9.2 9.5  GLUCOSE 106* 92 89     Imaging/Diagnostic Tests: CT Angio Chest PE w/ or w/out contrast Result Date: 07/14/2016 CLINICAL DATA:  Shortness of breath today. EXAM: CT ANGIOGRAPHY CHEST WITH CONTRAST TECHNIQUE: Multidetector CT imaging of the chest was performed using the standard protocol during bolus administration of intravenous contrast.  Multiplanar CT image reconstructions and MIPs were obtained to evaluate the vascular anatomy. CONTRAST:  80 cc Isovue 370 COMPARISON:  Chest x-ray dated 07/15/2014 and chest CT dated 08/22/2015 FINDINGS: Cardiovascular: Satisfactory opacification of the pulmonary arteries to the segmental level. No evidence of pulmonary embolism. Cardiomegaly. No pericardial effusion. Mediastinum/Nodes: No enlarged mediastinal, hilar, or axillary lymph nodes. Thyroid gland, trachea, and esophagus demonstrate no significant findings. Lungs/Pleura: Hazy infiltrates in both upper lobes, particularly on the right and to a lesser degree in both lower lobes. This could represent pneumonitis or pulmonary edema. There are no pleural effusions however. Upper Abdomen: Normal. Musculoskeletal: No chest wall abnormality. No acute or significant osseous findings. Review of the MIP images confirms the above findings. IMPRESSION: 1. No pulmonary emboli. 2. Hazy infiltrates in both lungs, most likely representing mild pulmonary edema. Is the patient febrile? No pleural effusions. 3. Cardiomegaly. Electronically Signed   By: Lorriane Shire M.D.   On: 07/14/2016 14:00   Dg Chest Port 1 View Result Date: 07/14/2016 CLINICAL DATA:  Dyspnea and fever EXAM: PORTABLE CHEST 1 VIEW COMPARISON:  07/09/2016 FINDINGS: Vascular congestion is worse. Low volumes with vascular crowding and bibasilar atelectasis. Upper lungs clear. Central pulmonary edema is suspected. No pneumothorax pericardium. IMPRESSION: Above findings are most consistent with mild CHF and volume overload. Electronically Signed   By: Rodena Goldmann.D.  On: 07/14/2016 07:16   DG Tibia/Fibula Right Port CLINICAL DATA:  Postop fracture fixation. EXAM: PORTABLE RIGHT TIBIA AND FIBULA - 2 VIEW COMPARISON:  07/09/2016 FINDINGS: Interval fixation of the mid tibia fracture with a intramedullary rod along with 2 proximal and 2 distal interlocking screws. Good position and alignment  without complicating features. There is also improved position and alignment of the proximal fibular shaft fracture. One half shaft width of medial displacement. The knee and ankle joints are maintained. IMPRESSION: Internal fixation of mid tibia fracture with an intramedullary rod. Anatomic alignment without complicating features. Improved position and alignment of the proximal fibular shaft fracture. Electronically Signed   By: Marijo Sanes M.D.   On: 07/12/2016 17:05  DG Tibia/Fibula Right CLINICAL DATA:  Fracture after a fall. EXAM: DG C-ARM 61-120 MIN; RIGHT TIBIA AND FIBULA - 2 VIEW COMPARISON:  None. FINDINGS: Open reduction internal fixation of the tibial fracture with intramedullary rod. Satisfactory position and alignment. IMPRESSION: As above. Electronically Signed   By: Staci Righter M.D.   On: 07/12/2016 16:06  DG C-Arm 1-60 min CLINICAL DATA:  Fracture after a fall. EXAM: DG C-ARM 61-120 MIN; RIGHT TIBIA AND FIBULA - 2 VIEW COMPARISON:  None. FINDINGS: Open reduction internal fixation of the tibial fracture with intramedullary rod. Satisfactory position and alignment. IMPRESSION: As above. Electronically Signed   By: Staci Righter M.D.   On: 07/12/2016 16:06  Kathrene Alu, MD 07/19/2016, 6:58 AM PGY-1, [place] Intern pager: [phone], text pages welcome

## 2016-07-19 NOTE — Social Work (Addendum)
[name] met with patient and friend at bedside.   [name] discussed SNF options and provided patient with updated SNF list. [name] discussed selecting a SNF for placement. Per nurse, pt will more than likely DC 07/20/16. Patient's SNF 1. [facility] 2. [facility].   [name] previously sent insurance auth on 7/3/ and will f/u for auth on 7/5 due to holiday closing.  [name] spoke with [facility] in Admissions and she will have a bed for patient on 7/5. Tracy(admissions) will cover for her on 7/5 on her phone [phone].   Elissa Hefty, LCSW Clinical Social Worker [phone]

## 2016-07-19 NOTE — Progress Notes (Signed)
Occupational Therapy Treatment Patient Details Name: Tiffany [name] MRN: [id number] DOB: [birth year] Today's Date: 07/19/2016    History of present illness 56 Y/O F with PMX of Morbid obesity, DM2, CVA, Seizure,Paranoid, OSA on CPAP,manic depression, HTN,HLD, COPD,CHF, CKD, Cocaine abuse presented after a fall sustaining R tibia fracture. Now pt s/p R tibia closed reduction and IM nail on 6/27.   OT comments  Pt demonstrates improving ability to perform functional transfers and ADLs.  She requires min - max A for ADLs and mod A +2 for functional transfers.  Continue to recommend SNF.   Follow Up Recommendations  SNF;Supervision/Assistance - 24 hour    Equipment Recommendations  None recommended by OT    Recommendations for Other Services      Precautions / Restrictions Precautions Precautions: Fall Precaution Comments: watch O2 sats, cholecystostomy  Required Braces or Orthoses: Other Brace/Splint Other Brace/Splint: R CAM boot Restrictions Weight Bearing Restrictions: Yes RLE Weight Bearing: Non weight bearing       Mobility Bed Mobility Overal bed mobility: Needs Assistance Bed Mobility: Supine to Sit     Supine to sit: Min assist     General bed mobility comments: Min a for Rt LE   Transfers Overall transfer level: Needs assistance Equipment used: Ambulation equipment used Transfers: Sit to/from [facility] Sit to Stand: Mod assist;+2 physical assistance Stand pivot transfers: Min assist;+2 physical assistance       General transfer comment: Pt requires assist to power up into standing and for Rt LE management     Balance Overall balance assessment: Needs assistance Sitting-balance support: Feet supported Sitting balance-Leahy Scale: Fair     Standing balance support: Bilateral upper extremity supported Standing balance-Leahy Scale: Poor Standing balance comment: reliant on bil. UE support                            ADL  either performed or assessed with clinical judgement   ADL Overall ADL's : Needs assistance/impaired Eating/Feeding: Independent   Grooming: Set up;Oral care;Sitting   Upper Body Bathing: Sitting;Set up   Lower Body Bathing: Maximal assistance;Sit to/from stand       Lower Body Dressing: Total assistance;Sit to/from stand   Toilet Transfer: Moderate assistance;Stand-pivot;+2 for physical assistance (sara )   Toileting- Clothing Manipulation and Hygiene: Total assistance;Sit to/from stand       Functional mobility during ADLs: Moderate assistance;+2 for physical assistance       Vision       Perception     Praxis      Cognition Arousal/Alertness: Awake/alert Behavior During Therapy: WFL for tasks assessed/performed Overall Cognitive Status: Within Functional Limits for tasks assessed                                          Exercises     Shoulder Instructions       General Comments VSS.  Pt on 5L supplemental 02     Pertinent Vitals/ Pain       Pain Assessment: Faces Faces Pain Scale: Hurts even more Pain Location: RLE with dependant positioning Pain Descriptors / Indicators: Aching;Grimacing;Guarding;Throbbing;Tightness Pain Intervention(s): Monitored during session;Repositioned;Premedicated before session  Home Living  Prior Functioning/Environment              Frequency  Min 2X/week        Progress Toward Goals  OT Goals(current goals can now be found in the care plan section)  Progress towards OT goals: Progressing toward goals     Plan Discharge plan remains appropriate    Co-evaluation    PT/OT/SLP Co-Evaluation/Treatment: Yes Reason for Co-Treatment: Complexity of the patient's impairments (multi-system involvement);For patient/therapist safety   OT goals addressed during session: ADL's and self-care      AM-PAC PT "6 Clicks" Daily Activity      Outcome Measure   Help from another person eating meals?: A Little Help from another person taking care of personal grooming?: A Little Help from another person toileting, which includes using toliet, bedpan, or urinal?: Total Help from another person bathing (including washing, rinsing, drying)?: A Lot Help from another person to put on and taking off regular upper body clothing?: A Little Help from another person to put on and taking off regular lower body clothing?: A Lot 6 Click Score: 14    End of Session Equipment Utilized During Treatment: Gait belt;Oxygen;Other (comment) (CAM boot )  OT Visit Diagnosis: Unsteadiness on feet (R26.81);Other abnormalities of gait and mobility (R26.89);History of falling (Z91.81);Muscle weakness (generalized) (M62.81);Pain Pain - Right/Left: Right Pain - part of body: Leg   Activity Tolerance Patient tolerated treatment well   Patient Left in chair;with call bell/phone within reach   Nurse Communication Mobility status;Need for lift equipment        Time: 0370-4888 OT Time Calculation (min): 43 min  Charges: OT General Charges $OT Visit: 1 Procedure OT Treatments $Therapeutic Activity: 8-22 mins  [facility], OTR/L [phone]    Lucille Passy M 07/19/2016, 3:26 PM

## 2016-07-19 NOTE — Progress Notes (Addendum)
Left a message with IR concerning Tiffany [name]'s drain malposition and expressed that we would appreciate their evaluation of her drain before her discharge, which we hope will be tomorrow, 7/5.  They were advised to page the [facility] team with any questions.  We appreciate their assistance.

## 2016-07-20 ENCOUNTER — Inpatient Hospital Stay (HOSPITAL_COMMUNITY): Payer: Medicare HMO

## 2016-07-20 ENCOUNTER — Encounter (HOSPITAL_COMMUNITY): Payer: Self-pay | Admitting: Interventional Radiology

## 2016-07-20 DIAGNOSIS — G4733 Obstructive sleep apnea (adult) (pediatric): Secondary | ICD-10-CM | POA: Diagnosis not present

## 2016-07-20 DIAGNOSIS — S82401A Unspecified fracture of shaft of right fibula, initial encounter for closed fracture: Secondary | ICD-10-CM | POA: Diagnosis not present

## 2016-07-20 DIAGNOSIS — J440 Chronic obstructive pulmonary disease with acute lower respiratory infection: Secondary | ICD-10-CM | POA: Diagnosis not present

## 2016-07-20 DIAGNOSIS — Z9181 History of falling: Secondary | ICD-10-CM | POA: Diagnosis not present

## 2016-07-20 DIAGNOSIS — I5033 Acute on chronic diastolic (congestive) heart failure: Secondary | ICD-10-CM | POA: Diagnosis not present

## 2016-07-20 DIAGNOSIS — R062 Wheezing: Secondary | ICD-10-CM | POA: Diagnosis not present

## 2016-07-20 DIAGNOSIS — S86191A Other injury of other muscle(s) and tendon(s) of posterior muscle group at lower leg level, right leg, initial encounter: Secondary | ICD-10-CM | POA: Diagnosis not present

## 2016-07-20 DIAGNOSIS — T85520A Displacement of bile duct prosthesis, initial encounter: Secondary | ICD-10-CM | POA: Diagnosis not present

## 2016-07-20 DIAGNOSIS — S72009A Fracture of unspecified part of neck of unspecified femur, initial encounter for closed fracture: Secondary | ICD-10-CM | POA: Diagnosis not present

## 2016-07-20 DIAGNOSIS — I509 Heart failure, unspecified: Secondary | ICD-10-CM | POA: Diagnosis not present

## 2016-07-20 DIAGNOSIS — J189 Pneumonia, unspecified organism: Secondary | ICD-10-CM | POA: Diagnosis not present

## 2016-07-20 DIAGNOSIS — J45909 Unspecified asthma, uncomplicated: Secondary | ICD-10-CM | POA: Diagnosis not present

## 2016-07-20 DIAGNOSIS — S82831S Other fracture of upper and lower end of right fibula, sequela: Secondary | ICD-10-CM | POA: Diagnosis not present

## 2016-07-20 DIAGNOSIS — M6281 Muscle weakness (generalized): Secondary | ICD-10-CM | POA: Diagnosis not present

## 2016-07-20 DIAGNOSIS — M79604 Pain in right leg: Secondary | ICD-10-CM | POA: Diagnosis not present

## 2016-07-20 DIAGNOSIS — J9620 Acute and chronic respiratory failure, unspecified whether with hypoxia or hypercapnia: Secondary | ICD-10-CM | POA: Diagnosis not present

## 2016-07-20 DIAGNOSIS — S82201A Unspecified fracture of shaft of right tibia, initial encounter for closed fracture: Secondary | ICD-10-CM | POA: Diagnosis not present

## 2016-07-20 DIAGNOSIS — Z4789 Encounter for other orthopedic aftercare: Secondary | ICD-10-CM | POA: Diagnosis not present

## 2016-07-20 DIAGNOSIS — J449 Chronic obstructive pulmonary disease, unspecified: Secondary | ICD-10-CM | POA: Diagnosis not present

## 2016-07-20 DIAGNOSIS — R0602 Shortness of breath: Secondary | ICD-10-CM | POA: Diagnosis not present

## 2016-07-20 DIAGNOSIS — S82221S Displaced transverse fracture of shaft of right tibia, sequela: Secondary | ICD-10-CM | POA: Diagnosis not present

## 2016-07-20 DIAGNOSIS — R278 Other lack of coordination: Secondary | ICD-10-CM | POA: Diagnosis not present

## 2016-07-20 DIAGNOSIS — R2681 Unsteadiness on feet: Secondary | ICD-10-CM | POA: Diagnosis not present

## 2016-07-20 HISTORY — PX: IR CHOLANGIOGRAM EXISTING TUBE: IMG6040

## 2016-07-20 LAB — GLUCOSE, CAPILLARY
Glucose-Capillary: 89 mg/dL (ref 65–99)
Glucose-Capillary: 104 mg/dL — ABNORMAL HIGH (ref 65–99)
Glucose-Capillary: 130 mg/dL — ABNORMAL HIGH (ref 65–99)

## 2016-07-20 MED ORDER — IOPAMIDOL (ISOVUE-300) INJECTION 61%
INTRAVENOUS | Status: AC
  Administered 2016-07-20: 10 mL
  Filled 2016-07-20: qty 50

## 2016-07-20 MED ORDER — TORSEMIDE 20 MG PO TABS: 40.0000 mg | ORAL_TABLET | Freq: Two times a day (BID) | ORAL | 0 refills | Status: DC

## 2016-07-20 MED ORDER — BENZTROPINE MESYLATE 0.5 MG PO TABS: 0.5000 mg | ORAL_TABLET | Freq: Every day | ORAL | 0 refills | Status: DC

## 2016-07-20 NOTE — Progress Notes (Signed)
Pt discharged to [facility] via [facility]. Pt alert and oriented x4, VVS, IV removed, pt on 5L O2, All questions answered. Report called to [facility] by day shift RN.

## 2016-07-20 NOTE — Progress Notes (Signed)
Came in to check on Tiffany [name] today. She states she is doing fine and not having trouble after chole tube was removed. Was informed of possible discharge and has agreed.   Dalphine Handing, PGY-1 [facility]  07/20/2016 1:30 PM

## 2016-07-20 NOTE — Progress Notes (Signed)
RT NOTE:  Pt placed on V60 BIPAP. Pt resting comfortably.

## 2016-07-20 NOTE — Clinical Social Work Note (Addendum)
[name] faxed updated clinicals to [facility]. [name] left voicemail for Industrial/product designer at [facility] and asked that she call [name] to notify that she received the fax.  Dayton Scrape, [place] [phone]  10:27 am [name] spoke with Manager at [facility]. She has received the patient's clinicals and will make sure the RN gets them. [name] will follow up later today.  Dayton Scrape, [place]  12:52 pm [name] called automated service through [facility]. Patient's authorization is pending.  Dayton Scrape, [name] [phone]  2:49 pm Authorization still pending.  Dayton Scrape, [place] [phone]  3:17 pm [name] left voicemail for manager at [facility] to check status of authorization.  Dayton Scrape, [place] [phone]  4:33 pm Authorization approved: [id number]. SNF and MD notified.  Dayton Scrape, [place]

## 2016-07-20 NOTE — Procedures (Signed)
Pre Procedural Dx: Acute Cholecystitis, poor surgical candidate.  Post Procedural Dx: Same  Contrast injection confirms malpositioning of chole tube without opacification of prior chole track or the gall bladder.  As such, chole tube removed in tact.  Dressing placed.   Complications: None immediate.  PLAN: Given lack of recurrent biliary obstructive symptoms, repeat chole tube placement/replacement will be deferred at this time.   Ronny Bacon, MD Pager #: [phone]

## 2016-07-20 NOTE — Clinical Social Work Placement (Signed)
   CLINICAL SOCIAL WORK PLACEMENT  NOTE  Date:  07/20/2016  Patient Details  Name: Tiffany [name] MRN: [id number] Date of Birth: January 15, 1961  Clinical Social Work is seeking post-discharge placement for this patient at the [place] level of care (*[name] will initial, date and re-position this form in  chart as items are completed):  Yes   Patient/family provided with [place] Work Department's list of facilities offering this level of care within the geographic area requested by the patient (or if unable, by the patient's family).  Yes   Patient/family informed of their freedom to choose among providers that offer the needed level of care, that participate in Medicare, Medicaid or managed care program needed by the patient, have an available bed and are willing to accept the patient.  Yes   Patient/family informed of [facility]'s ownership interest in [facility] and [facility], as well as of the fact that they are under no obligation to receive care at these facilities.  PASRR submitted to EDS on 07/17/16     PASRR number received on 07/18/16     Existing PASRR number confirmed on       FL2 transmitted to all facilities in geographic area requested by pt/family on 07/17/16     FL2 transmitted to all facilities within larger geographic area on       Patient informed that his/her managed care company has contracts with or will negotiate with certain facilities, including the following:        Yes   Patient/family informed of bed offers received.  Patient chooses bed at [facility]     Physician recommends and patient chooses bed at      Patient to be transferred to [facility] on 07/20/16.  Patient to be transferred to facility by [facility]     Patient family notified on 07/20/16 of transfer.  Name of family member notified:  Patient declined for family to be notified. Said her son is already aware.     PHYSICIAN Please prepare  prescriptions     Additional Comment:    _______________________________________________ Candie Chroman, LCSW 07/20/2016, 5:18 PM

## 2016-07-20 NOTE — Discharge Instructions (Signed)
We have made some changes to your medications: - Please decrease your Cogentin to 0.5mg  daily (one half-tablet) - Please decrease your torsemide (Demadex) to 40mg  twice a day.  - STOP taking oxybutynin.   It is important to go to your orthopedics hospital follow-up appointment and to schedule a hospital follow-up appointment with your regular doctor within the next week.

## 2016-07-20 NOTE — Progress Notes (Signed)
Attempted to call [facility] again for report.  Left message on Admission nurse voicemail.  Waiting for return phone call 07/20/2016 6:35 PM Tilda Burrow [place]

## 2016-07-20 NOTE — Progress Notes (Signed)
Have attempted to call [facility] twice to give report with no answer. Will attempt again.   07/20/2016 6:09 PM    Tiffany [name]

## 2016-07-20 NOTE — Progress Notes (Signed)
Family Medicine Teaching Service Daily Progress Note Intern Pager: [phone]  Patient name: Tiffany [name] Medical record number: [id number] Date of birth: 12-10-1960 Age: 56 y.o. Gender: female  Primary Care Provider: [name], MD Consultants: Orthopedic surgery  Code Status: Full  Pt Overview and Major Events to Date:  Tiffany [name] is a 56 yo female presenting for mid shaft tib/fib fraction secondary to mechanical fall s/p surgical fixation on 6/27. She was admitted to the [facility] on 07/10/16.   Assessment and Plan:  1. Right displaced tibia/fibula fracture s/p closed reduction and intramedullary  Orthopedic recommendations of NWB operative extremity and need for SNF placement. Will need f/u with orthopedics in 2 weeks for suture removal. Patient reports pain is improved. PT recommendations of SNF care and rolling walker.  -d/c with CAM walker and NWB -continue PT -will appreciate ortho recommendations  -continue hydrocodone-acetaminophen 1 tablet 5-325 mg q6 prn and acetaminophen 650 mg q6 prn for pain   2. Hypokalemia-resolved  Potassium on 7/4 is 4.1  3. Fever s/p tib-fib fracture repair Fever x1 postop. Patient currently is afebrile with temperature of 98.1. Vitals remain stable with BP 136/75, P 68, R 23. CTA showed no evidence of pulmonary embolism.  -continue to monitor vital signs    4. Asthma/COPD, chronic respiratory failure-stable Patient currently on home dose of 6L/min O2 via nasal canula. Continues BiPAP overnight and tolerates well.  -Continue BiPAP at night  -Consider d/c with CPAP, recommend outpatient sleep study -Continue home 6L oxygen, will d/c on 6L via nasal canula   -Continue duoneb q4 hrs prn  -Continue Breo Ellipta 1 puff daily  -Continue Singulair 10 mg qhs -Continue Tiotropium 18 mcg daily   5. HFpEF-Chronic LastECHO on 3/20 showed EF 60-65% with G2DD with normal wall motion. On exam appears euvolemic UOP x24 hrs -4 L.  -Continue  torsemide 40 mg BID -monitor I's and O's -Daily weights  -monitor BMP -continue aspirin 81 mg daily   6. Acute on Chronic Kidney Dysfunction-chronic, resolved Creatinine improving 1.41 --> 1.27 --> 1.16.  Baseline appears to be around 1.1-1.2 -Avoid nephrotoxic drugs -continue to monitor   7. Cholecystostomy-chronic Drain was exchanged by IR on 07/11/16 and patient tolerated the procedure. Patient had good output and continues to drain well.  -malpositioned on 7/4; sutures appear to have become detached from skin, still draining well.  Left message with IR asking for their reassessment.  IR called back to say they will assess it on 7/5 -continue to monitor  8. Bipolar 1 disorder/Paranoid schizophrenia, chronic, stable Patient is followed by Dr. Berniece Andreas, previously at [facility]. Patient's last hospitalization was in Nov 2017 at [facility]. Last visit with psychiatrist on 6/14. Patient is on a extensive drug regimen that seem to be working for her. [name] level slightly sub therapeutic  -Will continue divalproex 1000mg  daily -Will continue escitalopram 20 mg daily -Will continue Mirtazapine 45 mg qhs -Will continue Ziprasidone 80 mg qhs -Will reduce Cogentin to 0.5 mg qhs Per psych note  9. Bilateral Knee pain, chronic Patient with history of bilateral knee pain likely secondary to osteoarthritis in the setting of morbid obesity. -Continue to hold naproxen, CKD II and worsening creatinine  10. Hypertension BP currently 136/75 -continue to monitor   FEN/GI: Heart healthy/carb modified diet PPx: Lovenox   Disposition: Patient is clinically improved. There is a bed open for her at [facility], and she is agreeable to this.  Waiting for insurance approval.  Subjective:  Tiffany [name] is a 56 yo female presenting for mid shaft tib/fib fraction secondary to mechanical fall s/p surgical fixation on 6/2. Patient has a PMH significant for past medical history  significant for CKDII, T2DM, HFpEF, Asthma/COPD, HLD, HTN, Bipolar 1 disorder, paranoid schizophrenia.  Patient reports some pain in her Right leg and discomfort with her boot but overall feels better. Patient reports breathing is improved. Patient is urinated adequately.  She says that she can feel the fluid flushes in her cholecystostomy site, which is new for her, and she thinks that the tubing has become malpositioned.  Objective: Temp:  [97.9 F (36.6 C)-98.7 F (37.1 C)] 98.1 F (36.7 C) (07/05 0312) Pulse Rate:  [62-78] 68 (07/05 0312) Resp:  [16-27] 23 (07/05 0312) BP: (118-142)/(60-89) 136/75 (07/05 0312) SpO2:  [97 %-100 %] 100 % (07/05 0312) FiO2 (%):  [50 %] 50 % (07/05 0030) Physical Exam: General: NAD, Patient is an obese female, appears comfortable but somewhat restless Cardiovascular: RRR, no MRG Respiratory: CTAB, no increased work of breathing Abdomen: soft, non tender, non distended, bowel sounds heard in all four quadrants. Cholecystostomy bag is draining small amount of dark brown fluid, sutures appear detached from skin Extremities: no edema    Laboratory:  Recent Labs Lab 07/17/16 0238 07/18/16 0236 07/19/16 0525  WBC 9.7 9.2 9.0  HGB 8.0* 8.6* 10.1*  HCT 27.8* 30.1* 35.1*  PLT 366 413* 469*    Recent Labs Lab 07/17/16 0238 07/18/16 0236 07/19/16 0525  NA 138 143 144  K 3.4* 4.2 4.1  CL 95* 97* 96*  CO2 36* 39* 38*  BUN 24* 22* 17  CREATININE 1.41* 1.27* 1.16*  CALCIUM 8.7* 9.2 9.5  GLUCOSE 106* 92 89     Imaging/Diagnostic Tests: CT Angio Chest PE w/ or w/out contrast Result Date: 07/14/2016 CLINICAL DATA:  Shortness of breath today. EXAM: CT ANGIOGRAPHY CHEST WITH CONTRAST TECHNIQUE: Multidetector CT imaging of the chest was performed using the standard protocol during bolus administration of intravenous contrast. Multiplanar CT image reconstructions and MIPs were obtained to evaluate the vascular anatomy. CONTRAST:  80 cc Isovue 370  COMPARISON:  Chest x-ray dated 07/15/2014 and chest CT dated 08/22/2015 FINDINGS: Cardiovascular: Satisfactory opacification of the pulmonary arteries to the segmental level. No evidence of pulmonary embolism. Cardiomegaly. No pericardial effusion. Mediastinum/Nodes: No enlarged mediastinal, hilar, or axillary lymph nodes. Thyroid gland, trachea, and esophagus demonstrate no significant findings. Lungs/Pleura: Hazy infiltrates in both upper lobes, particularly on the right and to a lesser degree in both lower lobes. This could represent pneumonitis or pulmonary edema. There are no pleural effusions however. Upper Abdomen: Normal. Musculoskeletal: No chest wall abnormality. No acute or significant osseous findings. Review of the MIP images confirms the above findings. IMPRESSION: 1. No pulmonary emboli. 2. Hazy infiltrates in both lungs, most likely representing mild pulmonary edema. Is the patient febrile? No pleural effusions. 3. Cardiomegaly. Electronically Signed   By: Lorriane Shire M.D.   On: 07/14/2016 14:00   Dg Chest Port 1 View Result Date: 07/14/2016 CLINICAL DATA:  Dyspnea and fever EXAM: PORTABLE CHEST 1 VIEW COMPARISON:  07/09/2016 FINDINGS: Vascular congestion is worse. Low volumes with vascular crowding and bibasilar atelectasis. Upper lungs clear. Central pulmonary edema is suspected. No pneumothorax pericardium. IMPRESSION: Above findings are most consistent with mild CHF and volume overload. Electronically Signed   By: Marybelle Killings M.D.   On: 07/14/2016 07:16   DG Tibia/Fibula Right Port CLINICAL DATA:  Postop fracture fixation. EXAM: PORTABLE RIGHT TIBIA AND  FIBULA - 2 VIEW COMPARISON:  07/09/2016 FINDINGS: Interval fixation of the mid tibia fracture with a intramedullary rod along with 2 proximal and 2 distal interlocking screws. Good position and alignment without complicating features. There is also improved position and alignment of the proximal fibular shaft fracture. One half  shaft width of medial displacement. The knee and ankle joints are maintained. IMPRESSION: Internal fixation of mid tibia fracture with an intramedullary rod. Anatomic alignment without complicating features. Improved position and alignment of the proximal fibular shaft fracture. Electronically Signed   By: Marijo Sanes M.D.   On: 07/12/2016 17:05  DG Tibia/Fibula Right CLINICAL DATA:  Fracture after a fall. EXAM: DG C-ARM 61-120 MIN; RIGHT TIBIA AND FIBULA - 2 VIEW COMPARISON:  None. FINDINGS: Open reduction internal fixation of the tibial fracture with intramedullary rod. Satisfactory position and alignment. IMPRESSION: As above. Electronically Signed   By: Staci Righter M.D.   On: 07/12/2016 16:06  DG C-Arm 1-60 min CLINICAL DATA:  Fracture after a fall. EXAM: DG C-ARM 61-120 MIN; RIGHT TIBIA AND FIBULA - 2 VIEW COMPARISON:  None. FINDINGS: Open reduction internal fixation of the tibial fracture with intramedullary rod. Satisfactory position and alignment. IMPRESSION: As above. Electronically Signed   By: Staci Righter M.D.   On: 07/12/2016 16:06  Kathrene Alu, MD 07/20/2016, 6:05 AM PGY-1, [place] Intern pager: [phone], text pages welcome

## 2016-07-20 NOTE — Clinical Social Work Note (Signed)
[name] facilitated patient discharge including contacting patient family and facility to confirm patient discharge plans. Clinical information faxed to facility and family agreeable with plan. [name] arranged ambulance transport via [facility] to [facility]. RN to call report prior to discharge ([phone]).  [name] will sign off for now as social work intervention is no longer needed. Please consult us again if new needs arise.  Srihan Brutus, [name] [phone]   

## 2016-07-21 DIAGNOSIS — M79604 Pain in right leg: Secondary | ICD-10-CM | POA: Diagnosis not present

## 2016-07-24 ENCOUNTER — Ambulatory Visit (HOSPITAL_COMMUNITY): Payer: Self-pay | Admitting: Psychiatry

## 2016-07-26 ENCOUNTER — Other Ambulatory Visit (HOSPITAL_COMMUNITY): Payer: Self-pay | Admitting: Psychiatry

## 2016-07-26 ENCOUNTER — Other Ambulatory Visit: Payer: Self-pay | Admitting: *Deleted

## 2016-07-26 ENCOUNTER — Other Ambulatory Visit: Payer: Self-pay | Admitting: Family Medicine

## 2016-07-26 DIAGNOSIS — F200 Paranoid schizophrenia: Secondary | ICD-10-CM

## 2016-07-26 NOTE — Patient Outreach (Signed)
[facility]) Care Management  07/26/2016  Tiffany [name] Jun 02, 1960 [id number]   Call received from member to inform this care manager that she had been in the hospital with a broken leg and now in rehab.  [name] advised that LCSW, Gildardo Griffes, has been assigned to her and will contact her soon.  [name] made aware that this care manager will follow up with her after discharge.  She verbalizes understanding.  [name], [place], MSN [place] [phone]

## 2016-07-27 ENCOUNTER — Other Ambulatory Visit: Payer: Self-pay | Admitting: *Deleted

## 2016-07-27 ENCOUNTER — Encounter: Payer: Self-pay | Admitting: *Deleted

## 2016-07-27 ENCOUNTER — Ambulatory Visit: Payer: Self-pay | Admitting: *Deleted

## 2016-07-27 NOTE — Patient Outreach (Addendum)
[facility]) Care Management  [place]  07/27/2016   Tiffany [name] [birth year] [id number]    Encounter Medications:  Outpatient Encounter Prescriptions as of 07/27/2016  Medication Sig  . albuterol (PROVENTIL) (2.5 MG/3ML) 0.083% nebulizer solution Take 3 mLs (2.5 mg total) by nebulization every 6 (six) hours as needed for wheezing or shortness of breath.  . Alcohol Swabs (B-D SINGLE USE SWABS REGULAR) PADS CHECK CAPILLARY BLOOD GLUCOSE ONE TIME DAILY  . amLODipine (NORVASC) 5 MG tablet Take 1 tablet (5 mg total) by mouth daily.  [name] aspirin 81 MG EC tablet Take 1 tablet (81 mg total) by mouth daily. Swallow whole.  [name] atorvastatin (LIPITOR) 40 MG tablet Take 1 tablet (40 mg total) by mouth daily at bedtime  . benztropine (COGENTIN) 0.5 MG tablet Take 1 tablet (0.5 mg total) by mouth daily.  . Blood Glucose Monitoring Suppl (ACCU-CHEK AVIVA PLUS) w/Device KIT CHECK BLOOD SUGAR THREE TIMES DAILY  . divalproex ([name] ER) 500 MG 24 hr tablet Take 2 tablets (1,000 mg total) by mouth at bedtime. (Patient taking differently: Take 1,000 mg by mouth every morning. )  . escitalopram (LEXAPRO) 20 MG tablet Take 1 tablet (20 mg total) by mouth daily.  . fluticasone furoate-vilanterol (BREO ELLIPTA) 100-25 MCG/INH AEPB INHALE 1 PUFF EVERY DAY  . gabapentin (NEURONTIN) 600 MG tablet Take 1 tablet (600 mg total) by mouth 3 (three) times daily.  [name] glucose blood test strip Use to check blood sugar 3 times daily.  . metFORMIN (GLUCOPHAGE-XR) 500 MG 24 hr tablet Take 1 tablet (500 mg total) by mouth 2 (two) times daily.  . mirtazapine (REMERON) 45 MG tablet Take 1 tablet (45 mg total) by mouth at bedtime.  . montelukast (SINGULAIR) 10 MG tablet Take 1 tablet (10 mg total) by mouth at bedtime.  . Multiple Vitamin (MULTIVITAMIN WITH MINERALS) TABS tablet Take 1 tablet by mouth daily.  . naproxen (NAPROXEN DR) 500 MG EC tablet Take 500 mg by mouth 2 (two) times daily as needed  (pain).  [name] NAPROXEN DR 500 MG EC tablet Take 1 tablet (500 mg total) by mouth 2 (two) times daily as needed at noon and bedtime  . omeprazole (PRILOSEC) 40 MG capsule Take 1 capsule (40 mg total) by mouth 2 (two) times daily.  . OXYGEN Inhale 6 L into the lungs daily.  . potassium chloride SA (K-DUR,KLOR-CON) 20 MEQ tablet Take 2.5 tablets (50 mEq total) by mouth 2 (two) times daily.  [name] tiotropium (SPIRIVA HANDIHALER) 18 MCG inhalation capsule Place 1 capsule (18 mcg total) into inhaler and inhale daily.  [name] torsemide (DEMADEX) 20 MG tablet Take 2 tablets (40 mg total) by mouth 2 (two) times daily.  . VENTOLIN HFA 108 (90 Base) MCG/ACT inhaler Inhale 2 puffs into the lungs every 6 (six) hours as needed for wheezing or shortness of breath.   . ziprasidone (GEODON) 80 MG capsule Take 1 capsule (80 mg total) by mouth at bedtime.   No facility-administered encounter medications on file as of 07/27/2016.     Functional Status:  In your present state of health, do you have any difficulty performing the following activities: 07/27/2016 07/10/2016  Hearing? N N  Vision? N N  Difficulty concentrating or making decisions? N N  Walking or climbing stairs? Y Y  Dressing or bathing? Y Y  Doing errands, shopping? N N  Some recent data might be hidden    Fall/Depression Screening: Fall Risk  07/27/2016 06/20/2016 04/25/2016  Falls in the past year? Yes No No  Number falls in past yr: 2 or more - -  Injury with Fall? Yes - -  Risk Factor Category  High Fall Risk - -  Risk for fall due to : Impaired balance/gait;Impaired mobility;Impaired vision - History of fall(s);Impaired mobility  Follow up - - -   PHQ 2/9 Scores 07/27/2016 07/05/2016 06/20/2016 06/14/2016 06/06/2016 06/02/2016 05/31/2016  PHQ - 2 Score 0 0 0 0 0 0 0  PHQ- 9 Score - - - - - - -  Some encounter information is confidential and restricted. Go to Review Flowsheets activity to see all data.    Assessment:   [name] received consult from [facility], [place] on 7/5 that patient was [name] for short-term rehab. [name] met with patient in her room at [facility] today to complete intake assessments. Patient anticipates that she will need to be at [facility] for 4-6 weeks and plans to return home with her son, Tiffany [name] and his friend. Patient states that her son is available to provide assistance for her when she returns home and that her mother transports her to doctors appointments. Patient was active with [facility] RNCM, [place] prior to hospitalization. [name] will follow patient while in SNF and notify RNCM once patient is discharged home. Patient is unaware when her follow-up appointments are, [name] attempted to speak with [facility] but she was unavailable.   Plan:   [name] will call within the next 2 weeks to follow-up with social worker at [facility] and patient.   [facility] CM Care Plan Problem One     Most Recent Value  Care Plan Problem One  Patient was admitted to [facility] following a fall resulting in a tib-fib fracture & surgery.   Role Documenting the Problem One  Clinical Social Worker  Care Plan for Problem One  Active  [facility] Long Term Goal   Patient will participate with therapy at [facility] daily with plans to return home with son after rehab completion  [facility] Long Term Goal Start Date  07/27/16  Interventions for Problem One Long Term Goal  Patient educated on importance of participating with therapy & being an advocate for herself in the SNF       Raynaldo Opitz, [place] Worker cell #: [phone]

## 2016-08-01 ENCOUNTER — Ambulatory Visit (INDEPENDENT_AMBULATORY_CARE_PROVIDER_SITE_OTHER): Payer: Medicare HMO | Admitting: Orthopaedic Surgery

## 2016-08-01 ENCOUNTER — Ambulatory Visit (INDEPENDENT_AMBULATORY_CARE_PROVIDER_SITE_OTHER): Payer: Medicare HMO

## 2016-08-01 ENCOUNTER — Other Ambulatory Visit (HOSPITAL_COMMUNITY): Payer: Self-pay | Admitting: Psychiatry

## 2016-08-01 DIAGNOSIS — S82201A Unspecified fracture of shaft of right tibia, initial encounter for closed fracture: Secondary | ICD-10-CM

## 2016-08-01 DIAGNOSIS — S82401A Unspecified fracture of shaft of right fibula, initial encounter for closed fracture: Secondary | ICD-10-CM

## 2016-08-01 NOTE — Progress Notes (Signed)
Tiffany [name] is 3 weeks status post intramedullary nailing of a right tibia fracture. [name] at a nursing home currently. [name] doing much better. The incisions are healed without signs of infection. Minimal swelling. The staples were removed. X-ray show stable alignment and fixation of the fracture. She needs to continue to be nonweightbearing. Follow-up in 4 weeks with repeat 2 view x-rays of the right tib-fib.

## 2016-08-03 ENCOUNTER — Telehealth (INDEPENDENT_AMBULATORY_CARE_PROVIDER_SITE_OTHER): Payer: Self-pay

## 2016-08-03 ENCOUNTER — Ambulatory Visit: Payer: Self-pay | Admitting: Family Medicine

## 2016-08-03 NOTE — Telephone Encounter (Signed)
[facility] to let them know

## 2016-08-03 NOTE — Telephone Encounter (Signed)
[facility] Left voicemail and would like to know if patient can shower? Does patient need to put a dry bandage on  ? And also does she need to use an Ace Bandage?  Please advise. Thank you.

## 2016-08-03 NOTE — Telephone Encounter (Signed)
Yes.  She does not need ace

## 2016-08-09 DIAGNOSIS — M79604 Pain in right leg: Secondary | ICD-10-CM | POA: Diagnosis not present

## 2016-08-11 ENCOUNTER — Other Ambulatory Visit: Payer: Self-pay | Admitting: *Deleted

## 2016-08-11 ENCOUNTER — Telehealth: Payer: Self-pay | Admitting: *Deleted

## 2016-08-11 DIAGNOSIS — I5032 Chronic diastolic (congestive) heart failure: Secondary | ICD-10-CM | POA: Diagnosis not present

## 2016-08-11 DIAGNOSIS — R1310 Dysphagia, unspecified: Secondary | ICD-10-CM | POA: Diagnosis not present

## 2016-08-11 DIAGNOSIS — J449 Chronic obstructive pulmonary disease, unspecified: Secondary | ICD-10-CM | POA: Diagnosis not present

## 2016-08-11 DIAGNOSIS — I509 Heart failure, unspecified: Secondary | ICD-10-CM | POA: Diagnosis not present

## 2016-08-11 DIAGNOSIS — E1140 Type 2 diabetes mellitus with diabetic neuropathy, unspecified: Secondary | ICD-10-CM | POA: Diagnosis not present

## 2016-08-11 DIAGNOSIS — N189 Chronic kidney disease, unspecified: Secondary | ICD-10-CM | POA: Diagnosis not present

## 2016-08-11 DIAGNOSIS — I5033 Acute on chronic diastolic (congestive) heart failure: Secondary | ICD-10-CM | POA: Diagnosis not present

## 2016-08-11 DIAGNOSIS — G4733 Obstructive sleep apnea (adult) (pediatric): Secondary | ICD-10-CM | POA: Diagnosis not present

## 2016-08-11 DIAGNOSIS — E1122 Type 2 diabetes mellitus with diabetic chronic kidney disease: Secondary | ICD-10-CM | POA: Diagnosis not present

## 2016-08-11 DIAGNOSIS — J440 Chronic obstructive pulmonary disease with acute lower respiratory infection: Secondary | ICD-10-CM | POA: Diagnosis not present

## 2016-08-11 DIAGNOSIS — J189 Pneumonia, unspecified organism: Secondary | ICD-10-CM | POA: Diagnosis not present

## 2016-08-11 DIAGNOSIS — S82201D Unspecified fracture of shaft of right tibia, subsequent encounter for closed fracture with routine healing: Secondary | ICD-10-CM | POA: Diagnosis not present

## 2016-08-11 DIAGNOSIS — J45909 Unspecified asthma, uncomplicated: Secondary | ICD-10-CM | POA: Diagnosis not present

## 2016-08-11 DIAGNOSIS — Z9181 History of falling: Secondary | ICD-10-CM | POA: Diagnosis not present

## 2016-08-11 DIAGNOSIS — J439 Emphysema, unspecified: Secondary | ICD-10-CM | POA: Diagnosis not present

## 2016-08-11 DIAGNOSIS — M6281 Muscle weakness (generalized): Secondary | ICD-10-CM | POA: Diagnosis not present

## 2016-08-11 DIAGNOSIS — I130 Hypertensive heart and chronic kidney disease with heart failure and stage 1 through stage 4 chronic kidney disease, or unspecified chronic kidney disease: Secondary | ICD-10-CM | POA: Diagnosis not present

## 2016-08-11 DIAGNOSIS — S82401D Unspecified fracture of shaft of right fibula, subsequent encounter for closed fracture with routine healing: Secondary | ICD-10-CM | POA: Diagnosis not present

## 2016-08-11 DIAGNOSIS — R0602 Shortness of breath: Secondary | ICD-10-CM | POA: Diagnosis not present

## 2016-08-11 DIAGNOSIS — R062 Wheezing: Secondary | ICD-10-CM | POA: Diagnosis not present

## 2016-08-11 NOTE — Telephone Encounter (Signed)
Please provide verbal order for Ms. McElarth. Thanks Paige Vanderwoude

## 2016-08-11 NOTE — Patient Outreach (Signed)
Tiffany [name]) Care Management  08/11/2016  Tiffany [name] 05-14-1960 [id number]   [name] called & spoke with patient who informed [name] that she discharged home from [facility] 2 days ago on Wednesday, July 25th. Patient reports that she seems to be doing alright at home, she has a wheelchair and plans to have [facility] transport her to her PCP appointment on Mon, Aug 6th at 2:30. Patient also reports that she is setup with [facility] as well.  [name] will alert [facility] RNCM, Brayton Layman that patient has returned home & [name] will follow-up with patient in 2 weeks.    Raynaldo Opitz, LCSW [facility]  Clinical Social Worker cell #: [phone]

## 2016-08-11 NOTE — Telephone Encounter (Signed)
[facility]iffany [name] [facility] called and would like to get verbal ok from provider for speech therapy.  Patient has complained that due to her SOB she is having trouble speaking and swallowing since being home from rehab.  She would like to visit patient 1 time a week for 3 weeks.  She would also like to get an order for patient to have home health for ADLs at patient's request.  Will forward to MD to give the verbal ok.  She can be reached at [phone]. [name],CMA

## 2016-08-12 DIAGNOSIS — J439 Emphysema, unspecified: Secondary | ICD-10-CM | POA: Diagnosis not present

## 2016-08-12 DIAGNOSIS — E1122 Type 2 diabetes mellitus with diabetic chronic kidney disease: Secondary | ICD-10-CM | POA: Diagnosis not present

## 2016-08-12 DIAGNOSIS — S82201D Unspecified fracture of shaft of right tibia, subsequent encounter for closed fracture with routine healing: Secondary | ICD-10-CM | POA: Diagnosis not present

## 2016-08-12 DIAGNOSIS — I130 Hypertensive heart and chronic kidney disease with heart failure and stage 1 through stage 4 chronic kidney disease, or unspecified chronic kidney disease: Secondary | ICD-10-CM | POA: Diagnosis not present

## 2016-08-12 DIAGNOSIS — S82401D Unspecified fracture of shaft of right fibula, subsequent encounter for closed fracture with routine healing: Secondary | ICD-10-CM | POA: Diagnosis not present

## 2016-08-12 DIAGNOSIS — E1140 Type 2 diabetes mellitus with diabetic neuropathy, unspecified: Secondary | ICD-10-CM | POA: Diagnosis not present

## 2016-08-12 DIAGNOSIS — N189 Chronic kidney disease, unspecified: Secondary | ICD-10-CM | POA: Diagnosis not present

## 2016-08-12 DIAGNOSIS — R1310 Dysphagia, unspecified: Secondary | ICD-10-CM | POA: Diagnosis not present

## 2016-08-12 DIAGNOSIS — I5032 Chronic diastolic (congestive) heart failure: Secondary | ICD-10-CM | POA: Diagnosis not present

## 2016-08-14 ENCOUNTER — Other Ambulatory Visit: Payer: Self-pay | Admitting: *Deleted

## 2016-08-14 DIAGNOSIS — S82401D Unspecified fracture of shaft of right fibula, subsequent encounter for closed fracture with routine healing: Secondary | ICD-10-CM | POA: Diagnosis not present

## 2016-08-14 DIAGNOSIS — N189 Chronic kidney disease, unspecified: Secondary | ICD-10-CM | POA: Diagnosis not present

## 2016-08-14 DIAGNOSIS — R1310 Dysphagia, unspecified: Secondary | ICD-10-CM | POA: Diagnosis not present

## 2016-08-14 DIAGNOSIS — E1140 Type 2 diabetes mellitus with diabetic neuropathy, unspecified: Secondary | ICD-10-CM | POA: Diagnosis not present

## 2016-08-14 DIAGNOSIS — E1122 Type 2 diabetes mellitus with diabetic chronic kidney disease: Secondary | ICD-10-CM | POA: Diagnosis not present

## 2016-08-14 DIAGNOSIS — S82201D Unspecified fracture of shaft of right tibia, subsequent encounter for closed fracture with routine healing: Secondary | ICD-10-CM | POA: Diagnosis not present

## 2016-08-14 DIAGNOSIS — J439 Emphysema, unspecified: Secondary | ICD-10-CM | POA: Diagnosis not present

## 2016-08-14 DIAGNOSIS — I130 Hypertensive heart and chronic kidney disease with heart failure and stage 1 through stage 4 chronic kidney disease, or unspecified chronic kidney disease: Secondary | ICD-10-CM | POA: Diagnosis not present

## 2016-08-14 DIAGNOSIS — I5032 Chronic diastolic (congestive) heart failure: Secondary | ICD-10-CM | POA: Diagnosis not present

## 2016-08-14 NOTE — Patient Outreach (Signed)
[name]) Care Management  08/14/2016  Tiffany [name] 12/16/1960 [id number]   Notified by LCSW that member was discharged from [facility] on Wednesday, 7/24.  Call placed to member for transition of care.  She state that she has started with home visits from [facility] [place], will have nursing, PT, OT, and speech over the next several weeks.  She remains non-weight bearing, uses a wheelchair to move around.  She has the assistance of her son and mother in the home.  She has follow up appointments with primary MD and orthopedic.  Also has appointment for sleep study for August, advised to call to explain current condition and request reschedule.    Patient was recently discharged from hospital and all medications have been reviewed.  She report taking all medications as prescribed, denies questions.  She agrees to home visit next week, advised to contact with concerns.  [name], [place], MSN [place] [phone]

## 2016-08-16 ENCOUNTER — Telehealth: Payer: Self-pay | Admitting: Internal Medicine

## 2016-08-16 DIAGNOSIS — S82401D Unspecified fracture of shaft of right fibula, subsequent encounter for closed fracture with routine healing: Secondary | ICD-10-CM | POA: Diagnosis not present

## 2016-08-16 DIAGNOSIS — S82201D Unspecified fracture of shaft of right tibia, subsequent encounter for closed fracture with routine healing: Secondary | ICD-10-CM | POA: Diagnosis not present

## 2016-08-16 DIAGNOSIS — J439 Emphysema, unspecified: Secondary | ICD-10-CM | POA: Diagnosis not present

## 2016-08-16 DIAGNOSIS — R1310 Dysphagia, unspecified: Secondary | ICD-10-CM | POA: Diagnosis not present

## 2016-08-16 DIAGNOSIS — E1140 Type 2 diabetes mellitus with diabetic neuropathy, unspecified: Secondary | ICD-10-CM | POA: Diagnosis not present

## 2016-08-16 DIAGNOSIS — I130 Hypertensive heart and chronic kidney disease with heart failure and stage 1 through stage 4 chronic kidney disease, or unspecified chronic kidney disease: Secondary | ICD-10-CM | POA: Diagnosis not present

## 2016-08-16 DIAGNOSIS — E1122 Type 2 diabetes mellitus with diabetic chronic kidney disease: Secondary | ICD-10-CM | POA: Diagnosis not present

## 2016-08-16 DIAGNOSIS — N189 Chronic kidney disease, unspecified: Secondary | ICD-10-CM | POA: Diagnosis not present

## 2016-08-16 DIAGNOSIS — I5032 Chronic diastolic (congestive) heart failure: Secondary | ICD-10-CM | POA: Diagnosis not present

## 2016-08-16 NOTE — Telephone Encounter (Signed)
Please give verbal orders.

## 2016-08-16 NOTE — Telephone Encounter (Signed)
Tiffany [name] would like verbal orders for OT twice a week for two weeks, once a week for two weeks. ep

## 2016-08-17 NOTE — Telephone Encounter (Signed)
Verbal orders given  

## 2016-08-18 DIAGNOSIS — I130 Hypertensive heart and chronic kidney disease with heart failure and stage 1 through stage 4 chronic kidney disease, or unspecified chronic kidney disease: Secondary | ICD-10-CM | POA: Diagnosis not present

## 2016-08-18 DIAGNOSIS — E1122 Type 2 diabetes mellitus with diabetic chronic kidney disease: Secondary | ICD-10-CM | POA: Diagnosis not present

## 2016-08-18 DIAGNOSIS — J439 Emphysema, unspecified: Secondary | ICD-10-CM | POA: Diagnosis not present

## 2016-08-18 DIAGNOSIS — S82401D Unspecified fracture of shaft of right fibula, subsequent encounter for closed fracture with routine healing: Secondary | ICD-10-CM | POA: Diagnosis not present

## 2016-08-18 DIAGNOSIS — R1310 Dysphagia, unspecified: Secondary | ICD-10-CM | POA: Diagnosis not present

## 2016-08-18 DIAGNOSIS — N189 Chronic kidney disease, unspecified: Secondary | ICD-10-CM | POA: Diagnosis not present

## 2016-08-18 DIAGNOSIS — E1140 Type 2 diabetes mellitus with diabetic neuropathy, unspecified: Secondary | ICD-10-CM | POA: Diagnosis not present

## 2016-08-18 DIAGNOSIS — I5032 Chronic diastolic (congestive) heart failure: Secondary | ICD-10-CM | POA: Diagnosis not present

## 2016-08-18 DIAGNOSIS — S82201D Unspecified fracture of shaft of right tibia, subsequent encounter for closed fracture with routine healing: Secondary | ICD-10-CM | POA: Diagnosis not present

## 2016-08-21 ENCOUNTER — Ambulatory Visit (INDEPENDENT_AMBULATORY_CARE_PROVIDER_SITE_OTHER): Payer: Medicare HMO | Admitting: Internal Medicine

## 2016-08-21 ENCOUNTER — Encounter: Payer: Self-pay | Admitting: Internal Medicine

## 2016-08-21 VITALS — BP 140/80 | HR 74 | Temp 98.4°F | Ht 64.0 in

## 2016-08-21 DIAGNOSIS — Z9889 Other specified postprocedural states: Secondary | ICD-10-CM

## 2016-08-21 DIAGNOSIS — J9612 Chronic respiratory failure with hypercapnia: Secondary | ICD-10-CM | POA: Diagnosis not present

## 2016-08-21 DIAGNOSIS — I5032 Chronic diastolic (congestive) heart failure: Secondary | ICD-10-CM

## 2016-08-21 DIAGNOSIS — S82221D Displaced transverse fracture of shaft of right tibia, subsequent encounter for closed fracture with routine healing: Secondary | ICD-10-CM | POA: Diagnosis not present

## 2016-08-21 DIAGNOSIS — J42 Unspecified chronic bronchitis: Secondary | ICD-10-CM | POA: Diagnosis not present

## 2016-08-21 DIAGNOSIS — J9611 Chronic respiratory failure with hypoxia: Secondary | ICD-10-CM | POA: Diagnosis not present

## 2016-08-21 NOTE — Patient Instructions (Addendum)
I am going to set you up for a home health nurse to come in. You should hear from social work in the next couple of days to help with your issues of care at home. We will check your potassium today, please continue taking torsemide as needed. Please follow up with me in 1 week. I want you to set up an appointment with your cardiologist and lung doctor as soon as possible

## 2016-08-21 NOTE — Progress Notes (Signed)
[name] Tiffany Mo, MD Phone: [phone]  Reason For Visit: Hospital Follow Up   # CHF - ECHO in March 2018 with Grade 2 Diastolic Dysfunction  - Patient has not been taking torsemide twice daily as prescribed at discharge  - States she takes that she only take torsemide when her feet and legs swell up  - She states the reason is that her mom takes care of her and has been able to run back and forth change her as she is a big lady. She does not have much help since getting back from the rehab  - Did not have a home healthcare to help with patient - Has been on 6 Liters oxygen, not requiring more, no significant cough - Patient feels SOB when she exercises - She had not be able to weight herself due not bearing weight with her fracture  - States she usually feels distended when she has fluid on her, and feels fine at this point  - Has not followed up with cardiology yet   # COPD, Severe  - Has been taking Spirivia and Breo  - Albuterol inhaler- has not had to use it since leaving the hospital  - No wheezing noted.  - Stopped smoking in march 2018 which has helped significantly with her breathing  - patient was using CPAP in the hospital and would benefit from CPAP machine    # Right mid-shaft tib/fib fracture with repair  - A couple of days, patient states she accidentally put some weight on her leg and it hurts a little bit. Otherwise feels good.  - Still in the CAM boat - Patient is suppose to follow up with orthopedic doc on the 23rd   Past Medical History Reviewed problem list.  Medications- reviewed and updated No additions to family history Social history- patient is a former smoker  Objective: BP 140/80 (BP Location: Right Arm, Patient Position: Sitting, Cuff Size: Large)   Pulse 74   Temp 98.4 F (36.9 C) (Oral)   Ht '5\' 4"'  (1.626 m)   SpO2 95%  Gen: NAD, alert, cooperative with exam, 6 Liters of oxygen in place  HEENT: Normal  Neck: No masses palpated. No lymphadenopathy    Nose: nasal turbinates moist    Throat: moist mucus membranes, no erythema Cardio: regular rate and rhythm, S1S2 heard, no murmurs appreciated Pulm: No increased WOB noted some rhonchi on initial deep breaths which disspated on repeat,  No wheezes, no crackles,  Abdominal: No distention per patient, no tenderness  Extremities: warm, well perfused, possibly trace edema in left lower extremity, CAM boot in place, no edema at the hip  Skin: dry, intact, no rashes or lesions  Assessment/Plan: See problem based a/p  COPD (chronic obstructive pulmonary disease) (HCC) Much improvement after stopping smoking, currently on 6Liters of oxygen - no recent change in oxygen requirement   Continue current regiment of Spiriva and Breo  Albuterol inhaler as needed - has not need albuterol inhaler  Will place order for sleep study - concern for sleep apnea in the hospital  Encouraged to follow up with pulmonolgy  Follow up in 1 week   Chronic diastolic heart failure [facility]) Per Hospital discharge patient was supposed to be on torsemide 25m twice daily. However has been taking it as a when necessary basis due to difficulty with changing herself when she has to urinate. She denies any fluid overload. She has not been able to weigh herself at home  as she is nonweightbearing. No signs or symptoms on exam of fluid overload, no edema/trace in lower extremities, no crackles. Per patient she usually feels distended and has not felt that way. -Will continue with PRN torsemide BID - Currently on 20 mg KDUR daily as well  -Will place home health ordered for nursing visit -Will discuss with Neoma Laming from social work to determine what other needs can be met from home - recommend follow up with cardiology in near future  - Obtain BMET today to check electrolytes  - Follow up in 1 week   Closed displaced transverse fracture of shaft of right tibia Pain improved overall.  Patient still in a cam boot. Doing well follow-up with orthopedics in the next couple weeks. Has already been to them 1.

## 2016-08-22 ENCOUNTER — Telehealth: Payer: Self-pay | Admitting: Internal Medicine

## 2016-08-22 ENCOUNTER — Other Ambulatory Visit (HOSPITAL_COMMUNITY): Payer: Self-pay | Admitting: Psychiatry

## 2016-08-22 ENCOUNTER — Other Ambulatory Visit: Payer: Self-pay | Admitting: *Deleted

## 2016-08-22 DIAGNOSIS — I5032 Chronic diastolic (congestive) heart failure: Secondary | ICD-10-CM | POA: Diagnosis not present

## 2016-08-22 DIAGNOSIS — J439 Emphysema, unspecified: Secondary | ICD-10-CM | POA: Diagnosis not present

## 2016-08-22 DIAGNOSIS — S82201D Unspecified fracture of shaft of right tibia, subsequent encounter for closed fracture with routine healing: Secondary | ICD-10-CM | POA: Diagnosis not present

## 2016-08-22 DIAGNOSIS — I130 Hypertensive heart and chronic kidney disease with heart failure and stage 1 through stage 4 chronic kidney disease, or unspecified chronic kidney disease: Secondary | ICD-10-CM | POA: Diagnosis not present

## 2016-08-22 DIAGNOSIS — F200 Paranoid schizophrenia: Secondary | ICD-10-CM

## 2016-08-22 DIAGNOSIS — E1140 Type 2 diabetes mellitus with diabetic neuropathy, unspecified: Secondary | ICD-10-CM | POA: Diagnosis not present

## 2016-08-22 DIAGNOSIS — S82401D Unspecified fracture of shaft of right fibula, subsequent encounter for closed fracture with routine healing: Secondary | ICD-10-CM | POA: Diagnosis not present

## 2016-08-22 DIAGNOSIS — N189 Chronic kidney disease, unspecified: Secondary | ICD-10-CM | POA: Diagnosis not present

## 2016-08-22 DIAGNOSIS — R1310 Dysphagia, unspecified: Secondary | ICD-10-CM | POA: Diagnosis not present

## 2016-08-22 DIAGNOSIS — E1122 Type 2 diabetes mellitus with diabetic chronic kidney disease: Secondary | ICD-10-CM | POA: Diagnosis not present

## 2016-08-22 LAB — BASIC METABOLIC PANEL
BUN/Creatinine Ratio: 13 (ref 9–23)
BUN: 14 mg/dL (ref 6–24)
CO2: 26 mmol/L (ref 20–29)
Calcium: 9.9 mg/dL (ref 8.7–10.2)
Chloride: 105 mmol/L (ref 96–106)
Creatinine, Ser: 1.07 mg/dL — ABNORMAL HIGH (ref 0.57–1.00)
GFR calc Af Amer: 67 mL/min/{1.73_m2} (ref >59)
GFR calc non Af Amer: 58 mL/min/{1.73_m2} — ABNORMAL LOW (ref >59)
Glucose: 69 mg/dL (ref 65–99)
Potassium: 5.3 mmol/L — ABNORMAL HIGH (ref 3.5–5.2)
Sodium: 147 mmol/L — ABNORMAL HIGH (ref 134–144)

## 2016-08-22 NOTE — Telephone Encounter (Signed)
Called patient to let her know that her potassium is slightly elevated. I told her to stop her potassium and only take it when she takes her Torsemide. She voiced understanding. Otherwise patient indicates that she is doing well.

## 2016-08-23 ENCOUNTER — Other Ambulatory Visit: Payer: Self-pay | Admitting: *Deleted

## 2016-08-23 DIAGNOSIS — E1122 Type 2 diabetes mellitus with diabetic chronic kidney disease: Secondary | ICD-10-CM | POA: Diagnosis not present

## 2016-08-23 DIAGNOSIS — I130 Hypertensive heart and chronic kidney disease with heart failure and stage 1 through stage 4 chronic kidney disease, or unspecified chronic kidney disease: Secondary | ICD-10-CM | POA: Diagnosis not present

## 2016-08-23 DIAGNOSIS — J439 Emphysema, unspecified: Secondary | ICD-10-CM | POA: Diagnosis not present

## 2016-08-23 DIAGNOSIS — E1140 Type 2 diabetes mellitus with diabetic neuropathy, unspecified: Secondary | ICD-10-CM | POA: Diagnosis not present

## 2016-08-23 DIAGNOSIS — R1310 Dysphagia, unspecified: Secondary | ICD-10-CM | POA: Diagnosis not present

## 2016-08-23 DIAGNOSIS — I5032 Chronic diastolic (congestive) heart failure: Secondary | ICD-10-CM | POA: Diagnosis not present

## 2016-08-23 DIAGNOSIS — N189 Chronic kidney disease, unspecified: Secondary | ICD-10-CM | POA: Diagnosis not present

## 2016-08-23 DIAGNOSIS — S82201D Unspecified fracture of shaft of right tibia, subsequent encounter for closed fracture with routine healing: Secondary | ICD-10-CM | POA: Diagnosis not present

## 2016-08-23 DIAGNOSIS — S82401D Unspecified fracture of shaft of right fibula, subsequent encounter for closed fracture with routine healing: Secondary | ICD-10-CM | POA: Diagnosis not present

## 2016-08-23 MED ORDER — OMEPRAZOLE 40 MG PO CPDR: 40.0000 mg | DELAYED_RELEASE_CAPSULE | Freq: Two times a day (BID) | ORAL | 0 refills | Status: DC

## 2016-08-23 NOTE — Patient Outreach (Signed)
[name]) Care Management   08/23/2016  Tiffany [name] 03-Jan-1961 [id number]  Tiffany [name] is an 56 y.o. female  Subjective:   Member alert and oriented x3, complains of pain in right leg/chin area.  She report compliance with medications, using pill packaging for many of them.  Aware that Tiffany [name], Pharmacist, will follow up.  She state she has had follow up with primary MD, will have second follow up next week as well as follow up with ortho MD.  Objective:   Review of Systems  Constitutional: Negative.   HENT: Negative.   Eyes: Negative.   Respiratory: Negative.   Cardiovascular: Negative.   Gastrointestinal: Negative.   Genitourinary: Negative.   Musculoskeletal: Negative.   Skin: Negative.   Neurological: Negative.   Endo/Heme/Allergies: Negative.   Psychiatric/Behavioral: Negative.     Physical Exam  Constitutional: [name] oriented to person, place, and time. She appears well-developed and well-nourished.  Neck: Normal range of motion.  Cardiovascular: Normal rate, regular rhythm and normal heart sounds.   Respiratory: Effort normal and breath sounds normal.  [name]: Soft. Bowel sounds are normal.  Musculoskeletal: Normal range of motion.  Neurological: [name] alert and oriented to person, place, and time.  Skin: Skin is warm and dry.   BP 132/84   Pulse 82   SpO2 95%   Encounter Medications:   Outpatient Encounter Prescriptions as of 08/23/2016  Medication Sig  . albuterol (PROVENTIL) (2.5 MG/3ML) 0.083% nebulizer solution Take 3 mLs (2.5 mg total) by nebulization every 6 (six) hours as needed for wheezing or shortness of breath.  . Alcohol Swabs (B-D SINGLE USE SWABS REGULAR) PADS CHECK CAPILLARY BLOOD GLUCOSE ONE TIME DAILY  . amLODipine (NORVASC) 5 MG tablet Take 1 tablet (5 mg total) by mouth daily.  [name] aspirin 81 MG EC tablet Take 1 tablet (81 mg total) by mouth daily. Swallow whole.  [name] atorvastatin (LIPITOR) 40 MG tablet Take 1 tablet (40  mg total) by mouth daily at bedtime  . benztropine (COGENTIN) 0.5 MG tablet Take 1 tablet (0.5 mg total) by mouth daily.  . Blood Glucose Monitoring Suppl (ACCU-CHEK AVIVA PLUS) w/Device KIT CHECK BLOOD SUGAR THREE TIMES DAILY  . divalproex ([name] ER) 500 MG 24 hr tablet Take 2 tablets (1,000 mg total) by mouth at bedtime. (Patient taking differently: Take 1,000 mg by mouth every morning. )  . escitalopram (LEXAPRO) 20 MG tablet Take 1 tablet (20 mg total) by mouth daily.  . fluticasone furoate-vilanterol (BREO ELLIPTA) 100-25 MCG/INH AEPB INHALE 1 PUFF EVERY DAY  . gabapentin (NEURONTIN) 600 MG tablet Take 1 tablet (600 mg total) by mouth 3 (three) times daily.  [name] glucose blood test strip Use to check blood sugar 3 times daily.  . metFORMIN (GLUCOPHAGE-XR) 500 MG 24 hr tablet Take 1 tablet (500 mg total) by mouth 2 (two) times daily.  . mirtazapine (REMERON) 45 MG tablet Take 1 tablet (45 mg total) by mouth at bedtime.  . montelukast (SINGULAIR) 10 MG tablet Take 1 tablet (10 mg total) by mouth at bedtime.  . Multiple Vitamin (MULTIVITAMIN WITH MINERALS) TABS tablet Take 1 tablet by mouth daily.  . naproxen (NAPROXEN DR) 500 MG EC tablet Take 500 mg by mouth 2 (two) times daily as needed (pain).  [name] NAPROXEN DR 500 MG EC tablet Take 1 tablet (500 mg total) by mouth 2 (two) times daily as needed at noon and bedtime  . omeprazole (PRILOSEC) 40 MG capsule Take 1 capsule (  40 mg total) by mouth 2 (two) times daily.  . OXYGEN Inhale 6 L into the lungs daily.  [name] tiotropium (SPIRIVA HANDIHALER) 18 MCG inhalation capsule Place 1 capsule (18 mcg total) into inhaler and inhale daily.  [name] torsemide (DEMADEX) 20 MG tablet Take 2 tablets (40 mg total) by mouth 2 (two) times daily.  . VENTOLIN HFA 108 (90 Base) MCG/ACT inhaler Inhale 2 puffs into the lungs every 6 (six) hours as needed for wheezing or shortness of breath.   . ziprasidone (GEODON) 80 MG capsule Take 1 capsule (80 mg total) by mouth at bedtime.   . potassium chloride SA (K-DUR,KLOR-CON) 20 MEQ tablet Take 2.5 tablets (50 mEq total) by mouth 2 (two) times daily. (Patient not taking: Reported on 08/23/2016)   No facility-administered encounter medications on file as of 08/23/2016.     Functional Status:   In your present state of health, do you have any difficulty performing the following activities: 07/27/2016 07/10/2016  Hearing? N N  Vision? N N  Difficulty concentrating or making decisions? N N  Walking or climbing stairs? Y Y  Dressing or bathing? Y Y  Doing errands, shopping? N N  Some recent data might be hidden    Fall/Depression Screening:    Fall Risk  08/23/2016 07/27/2016 06/20/2016  Falls in the past year? Yes Yes No  Number falls in past yr: 2 or more 2 or more -  Injury with Fall? Yes Yes -  Risk Factor Category  High Fall Risk High Fall Risk -  Risk for fall due to : History of fall(s) Impaired balance/gait;Impaired mobility;Impaired vision -  Follow up Falls prevention discussed;Education provided - -   PHQ 2/9 Scores 08/21/2016 07/27/2016 07/05/2016 06/20/2016 06/14/2016 06/06/2016 06/02/2016  PHQ - 2 Score 0 0 0 0 0 0 0  PHQ- 9 Score - - - - - - -  Some encounter information is confidential and restricted. Go to Review Flowsheets activity to see all data.    Assessment:    Met with member at scheduled time.  She now has hospital bed, primarily on bed/chair rest with exception of going to MD appointments.  She confirms that she does have home health for nursing, PT, OT and speech.  She verbalizes some concern regarding transfer from bed to wheelchair/bedside commode as she is to remain non-weight bearing until evaluated by ortho MD, however she state she has had to put some pressure on her right leg when making such transfers. [name] advised against it, state PT/OT are going to re-educate her on safe transfers.  Member has not been able to weigh self for heart failure management, but report adhering to heart failure diet.   She denies any questions at this time.  Advised to contact with concerns.  Plan:   Will follow up next week with weekly transition of care call.  THN CM Care Plan Problem One     Most Recent Value  Care Plan Problem One  Risk for hospital readmission related to recent admission for broken leg  Role Documenting the Problem One  Care Management [place] for Problem One  Active  [facility] Long Term Goal   Patient will remain free from hospital readmision within the next 31 days  THN Long Term Goal Start Date  08/14/16  Interventions for Problem One Long Term Goal  Reviewed discharge instructions and restrictions (no weight bearing on right leg, bedrest, etc) with member.  Edcuated on importance of following instructions (  diet, medications, MD follow up) in effort to decrease risk of readmission  THN CM Short Term Goal #1   Patient will attend all follow up appointments as sceduled within the next 30 days  THN CM Short Term Goal #1 Start Date  08/14/16  Interventions for Short Term Goal #1  Confirmed that member has follow up appointment scheudled for primary MD.  Confirmed that member will have transportation (Will use [facility]).   THN CM Short Term Goal #2   Patient will take all medications as prescribed for the next 30 days  THN CM Short Term Goal #2 Start Date  08/14/16  Interventions for Short Term Goal #2  Medication list reviewed, advised of importance of taking as prescribed.  Informed [facility] pharmacist of discharge from [place], RN, MSN [place] Manager [phone]

## 2016-08-24 ENCOUNTER — Telehealth: Payer: Self-pay | Admitting: Internal Medicine

## 2016-08-24 DIAGNOSIS — J189 Pneumonia, unspecified organism: Secondary | ICD-10-CM | POA: Diagnosis not present

## 2016-08-24 DIAGNOSIS — R0602 Shortness of breath: Secondary | ICD-10-CM | POA: Diagnosis not present

## 2016-08-24 DIAGNOSIS — R062 Wheezing: Secondary | ICD-10-CM | POA: Diagnosis not present

## 2016-08-24 DIAGNOSIS — G4733 Obstructive sleep apnea (adult) (pediatric): Secondary | ICD-10-CM | POA: Diagnosis not present

## 2016-08-24 DIAGNOSIS — J45909 Unspecified asthma, uncomplicated: Secondary | ICD-10-CM | POA: Diagnosis not present

## 2016-08-24 DIAGNOSIS — J440 Chronic obstructive pulmonary disease with acute lower respiratory infection: Secondary | ICD-10-CM | POA: Diagnosis not present

## 2016-08-24 NOTE — Assessment & Plan Note (Addendum)
Much improvement after stopping smoking, currently on 6Liters of oxygen - no recent change in oxygen requirement   Continue current regiment of Spiriva and Breo  Albuterol inhaler as needed - has not need albuterol inhaler  Will place order for sleep study - concern for sleep apnea in the hospital  Encouraged to follow up with pulmonolgy  Follow up in 1 week

## 2016-08-24 NOTE — Assessment & Plan Note (Addendum)
Per Hospital discharge patient was supposed to be on torsemide 60m twice daily. However has been taking it as a when necessary basis due to difficulty with changing herself when she has to urinate. She denies any fluid overload. She has not been able to weigh herself at home as she is nonweightbearing. No signs or symptoms on exam of fluid overload, no edema/trace in lower extremities, no crackles. Per patient she usually feels distended and has not felt that way. -Will continue with PRN torsemide BID - Currently on 20 mg KDUR daily as well  -Will place home health ordered for nursing visit -Will discuss with DNeoma Lamingfrom social work to determine what other needs can be met from home - recommend follow up with cardiology in near future  - Obtain BMET today to check electrolytes  - Follow up in 1 week

## 2016-08-24 NOTE — Assessment & Plan Note (Signed)
Pain improved overall. Patient still in a cam boot. Doing well follow-up with orthopedics in the next couple weeks. Has already been to them 1.

## 2016-08-25 ENCOUNTER — Other Ambulatory Visit: Payer: Self-pay | Admitting: *Deleted

## 2016-08-25 DIAGNOSIS — I130 Hypertensive heart and chronic kidney disease with heart failure and stage 1 through stage 4 chronic kidney disease, or unspecified chronic kidney disease: Secondary | ICD-10-CM | POA: Diagnosis not present

## 2016-08-25 DIAGNOSIS — J439 Emphysema, unspecified: Secondary | ICD-10-CM | POA: Diagnosis not present

## 2016-08-25 DIAGNOSIS — R1310 Dysphagia, unspecified: Secondary | ICD-10-CM | POA: Diagnosis not present

## 2016-08-25 DIAGNOSIS — S82201D Unspecified fracture of shaft of right tibia, subsequent encounter for closed fracture with routine healing: Secondary | ICD-10-CM | POA: Diagnosis not present

## 2016-08-25 DIAGNOSIS — E1140 Type 2 diabetes mellitus with diabetic neuropathy, unspecified: Secondary | ICD-10-CM | POA: Diagnosis not present

## 2016-08-25 DIAGNOSIS — I5032 Chronic diastolic (congestive) heart failure: Secondary | ICD-10-CM | POA: Diagnosis not present

## 2016-08-25 DIAGNOSIS — S82401D Unspecified fracture of shaft of right fibula, subsequent encounter for closed fracture with routine healing: Secondary | ICD-10-CM | POA: Diagnosis not present

## 2016-08-25 DIAGNOSIS — E1122 Type 2 diabetes mellitus with diabetic chronic kidney disease: Secondary | ICD-10-CM | POA: Diagnosis not present

## 2016-08-25 DIAGNOSIS — N189 Chronic kidney disease, unspecified: Secondary | ICD-10-CM | POA: Diagnosis not present

## 2016-08-25 NOTE — Patient Outreach (Signed)
[facility]) Care Management  08/25/2016  JUNE RODE 10-20-1960 [id number]   [name] spoke with patient to follow-up on resources as patient is now home from SNF and reports doing well, is looking forward to an upcoming appointment with her Orthopedist, Dr. Erlinda Hong on 8/17. [name] had received call from Neoma Laming at [place] to coordinate with resources for patient. [name] spoke with patient about applying for Medicaid - patient states that she had tried in the past but that she was told that she would qualify once her medical bills exceeded $8,000 and currently they are at $29,000. Patient expressed interest in looking over information to apply. [name] will have CMA mail application and information on how to apply. [name] will follow-up in 2 weeks to ensure that she has received information and assist as needed.    Raynaldo Opitz, LCSW [facility]  Clinical Social Worker cell #: [phone]

## 2016-08-25 NOTE — Patient Outreach (Signed)
Request received from Kelly Harrison, LCSW to mail patient personal care resources.  Information mailed today. 

## 2016-08-28 ENCOUNTER — Encounter (HOSPITAL_BASED_OUTPATIENT_CLINIC_OR_DEPARTMENT_OTHER): Payer: Self-pay

## 2016-08-28 ENCOUNTER — Other Ambulatory Visit: Payer: Self-pay | Admitting: *Deleted

## 2016-08-28 DIAGNOSIS — S82201D Unspecified fracture of shaft of right tibia, subsequent encounter for closed fracture with routine healing: Secondary | ICD-10-CM | POA: Diagnosis not present

## 2016-08-28 DIAGNOSIS — J439 Emphysema, unspecified: Secondary | ICD-10-CM | POA: Diagnosis not present

## 2016-08-28 DIAGNOSIS — S82401D Unspecified fracture of shaft of right fibula, subsequent encounter for closed fracture with routine healing: Secondary | ICD-10-CM | POA: Diagnosis not present

## 2016-08-28 DIAGNOSIS — E1122 Type 2 diabetes mellitus with diabetic chronic kidney disease: Secondary | ICD-10-CM | POA: Diagnosis not present

## 2016-08-28 DIAGNOSIS — I5032 Chronic diastolic (congestive) heart failure: Secondary | ICD-10-CM | POA: Diagnosis not present

## 2016-08-28 DIAGNOSIS — N189 Chronic kidney disease, unspecified: Secondary | ICD-10-CM | POA: Diagnosis not present

## 2016-08-28 DIAGNOSIS — R1310 Dysphagia, unspecified: Secondary | ICD-10-CM | POA: Diagnosis not present

## 2016-08-28 DIAGNOSIS — E1140 Type 2 diabetes mellitus with diabetic neuropathy, unspecified: Secondary | ICD-10-CM | POA: Diagnosis not present

## 2016-08-28 DIAGNOSIS — I130 Hypertensive heart and chronic kidney disease with heart failure and stage 1 through stage 4 chronic kidney disease, or unspecified chronic kidney disease: Secondary | ICD-10-CM | POA: Diagnosis not present

## 2016-08-29 ENCOUNTER — Ambulatory Visit: Payer: Medicare HMO | Admitting: Internal Medicine

## 2016-08-29 ENCOUNTER — Ambulatory Visit (HOSPITAL_COMMUNITY): Payer: Self-pay | Admitting: Psychiatry

## 2016-08-29 DIAGNOSIS — R069 Unspecified abnormalities of breathing: Secondary | ICD-10-CM | POA: Diagnosis not present

## 2016-08-30 ENCOUNTER — Other Ambulatory Visit: Payer: Self-pay | Admitting: *Deleted

## 2016-08-30 DIAGNOSIS — R1310 Dysphagia, unspecified: Secondary | ICD-10-CM | POA: Diagnosis not present

## 2016-08-30 DIAGNOSIS — I5032 Chronic diastolic (congestive) heart failure: Secondary | ICD-10-CM | POA: Diagnosis not present

## 2016-08-30 DIAGNOSIS — E1140 Type 2 diabetes mellitus with diabetic neuropathy, unspecified: Secondary | ICD-10-CM | POA: Diagnosis not present

## 2016-08-30 DIAGNOSIS — J439 Emphysema, unspecified: Secondary | ICD-10-CM | POA: Diagnosis not present

## 2016-08-30 DIAGNOSIS — E1122 Type 2 diabetes mellitus with diabetic chronic kidney disease: Secondary | ICD-10-CM | POA: Diagnosis not present

## 2016-08-30 DIAGNOSIS — I130 Hypertensive heart and chronic kidney disease with heart failure and stage 1 through stage 4 chronic kidney disease, or unspecified chronic kidney disease: Secondary | ICD-10-CM | POA: Diagnosis not present

## 2016-08-30 DIAGNOSIS — N189 Chronic kidney disease, unspecified: Secondary | ICD-10-CM | POA: Diagnosis not present

## 2016-08-30 DIAGNOSIS — S82201D Unspecified fracture of shaft of right tibia, subsequent encounter for closed fracture with routine healing: Secondary | ICD-10-CM | POA: Diagnosis not present

## 2016-08-30 DIAGNOSIS — S82401D Unspecified fracture of shaft of right fibula, subsequent encounter for closed fracture with routine healing: Secondary | ICD-10-CM | POA: Diagnosis not present

## 2016-08-30 NOTE — Patient Outreach (Signed)
[facility]) Care Management  08/30/2016  Tiffany [name] [birth year] [id number]   Weekly transition of care call placed to member.  She report she is doing well, but still forget that she is not to be putting weight on her right leg, complains of pain.  She has follow up with orthopedic surgeon this Friday, continue to be involved with home health.  Advised again or importance of following instructions as to avoid causing complications with surgery site.  She report she has not taking Lasix on regular basis due to her decreased mobility and her family not being able to change her due to incontinence.  She state her primary MD is aware and that she will take medication when she see that she has some swelling in her legs/feet.  Report compliance with all other medications.    Denies any urgent concerns at this time.  Will attend appointment with ortho this week and primary MD as well as dietician next week.  Will follow up next week with weekly transition of care call.  [name], [place], MSN [place] [phone]

## 2016-08-31 ENCOUNTER — Telehealth (INDEPENDENT_AMBULATORY_CARE_PROVIDER_SITE_OTHER): Payer: Self-pay | Admitting: Orthopaedic Surgery

## 2016-08-31 DIAGNOSIS — N189 Chronic kidney disease, unspecified: Secondary | ICD-10-CM | POA: Diagnosis not present

## 2016-08-31 DIAGNOSIS — S82201D Unspecified fracture of shaft of right tibia, subsequent encounter for closed fracture with routine healing: Secondary | ICD-10-CM | POA: Diagnosis not present

## 2016-08-31 DIAGNOSIS — E1140 Type 2 diabetes mellitus with diabetic neuropathy, unspecified: Secondary | ICD-10-CM | POA: Diagnosis not present

## 2016-08-31 DIAGNOSIS — I130 Hypertensive heart and chronic kidney disease with heart failure and stage 1 through stage 4 chronic kidney disease, or unspecified chronic kidney disease: Secondary | ICD-10-CM | POA: Diagnosis not present

## 2016-08-31 DIAGNOSIS — E1122 Type 2 diabetes mellitus with diabetic chronic kidney disease: Secondary | ICD-10-CM | POA: Diagnosis not present

## 2016-08-31 DIAGNOSIS — S82401D Unspecified fracture of shaft of right fibula, subsequent encounter for closed fracture with routine healing: Secondary | ICD-10-CM | POA: Diagnosis not present

## 2016-08-31 DIAGNOSIS — I5032 Chronic diastolic (congestive) heart failure: Secondary | ICD-10-CM | POA: Diagnosis not present

## 2016-08-31 DIAGNOSIS — J439 Emphysema, unspecified: Secondary | ICD-10-CM | POA: Diagnosis not present

## 2016-08-31 DIAGNOSIS — R1310 Dysphagia, unspecified: Secondary | ICD-10-CM | POA: Diagnosis not present

## 2016-08-31 NOTE — Telephone Encounter (Signed)
[facility] wants to know if we received a fax they sent regarding pt.  [phone] ext [phone]

## 2016-09-01 ENCOUNTER — Ambulatory Visit (INDEPENDENT_AMBULATORY_CARE_PROVIDER_SITE_OTHER): Payer: Medicare HMO | Admitting: Orthopaedic Surgery

## 2016-09-01 DIAGNOSIS — I509 Heart failure, unspecified: Secondary | ICD-10-CM | POA: Diagnosis not present

## 2016-09-01 DIAGNOSIS — S82201D Unspecified fracture of shaft of right tibia, subsequent encounter for closed fracture with routine healing: Secondary | ICD-10-CM | POA: Diagnosis not present

## 2016-09-01 DIAGNOSIS — J189 Pneumonia, unspecified organism: Secondary | ICD-10-CM | POA: Diagnosis not present

## 2016-09-01 DIAGNOSIS — R1310 Dysphagia, unspecified: Secondary | ICD-10-CM | POA: Diagnosis not present

## 2016-09-01 DIAGNOSIS — J45909 Unspecified asthma, uncomplicated: Secondary | ICD-10-CM | POA: Diagnosis not present

## 2016-09-01 DIAGNOSIS — E1140 Type 2 diabetes mellitus with diabetic neuropathy, unspecified: Secondary | ICD-10-CM | POA: Diagnosis not present

## 2016-09-01 DIAGNOSIS — N189 Chronic kidney disease, unspecified: Secondary | ICD-10-CM | POA: Diagnosis not present

## 2016-09-01 DIAGNOSIS — S82401D Unspecified fracture of shaft of right fibula, subsequent encounter for closed fracture with routine healing: Secondary | ICD-10-CM | POA: Diagnosis not present

## 2016-09-01 DIAGNOSIS — I130 Hypertensive heart and chronic kidney disease with heart failure and stage 1 through stage 4 chronic kidney disease, or unspecified chronic kidney disease: Secondary | ICD-10-CM | POA: Diagnosis not present

## 2016-09-01 DIAGNOSIS — J440 Chronic obstructive pulmonary disease with acute lower respiratory infection: Secondary | ICD-10-CM | POA: Diagnosis not present

## 2016-09-01 DIAGNOSIS — M6281 Muscle weakness (generalized): Secondary | ICD-10-CM | POA: Diagnosis not present

## 2016-09-01 DIAGNOSIS — R0602 Shortness of breath: Secondary | ICD-10-CM | POA: Diagnosis not present

## 2016-09-01 DIAGNOSIS — G4733 Obstructive sleep apnea (adult) (pediatric): Secondary | ICD-10-CM | POA: Diagnosis not present

## 2016-09-01 DIAGNOSIS — E1122 Type 2 diabetes mellitus with diabetic chronic kidney disease: Secondary | ICD-10-CM | POA: Diagnosis not present

## 2016-09-01 DIAGNOSIS — I5032 Chronic diastolic (congestive) heart failure: Secondary | ICD-10-CM | POA: Diagnosis not present

## 2016-09-01 DIAGNOSIS — J439 Emphysema, unspecified: Secondary | ICD-10-CM | POA: Diagnosis not present

## 2016-09-01 DIAGNOSIS — J449 Chronic obstructive pulmonary disease, unspecified: Secondary | ICD-10-CM | POA: Diagnosis not present

## 2016-09-01 DIAGNOSIS — R062 Wheezing: Secondary | ICD-10-CM | POA: Diagnosis not present

## 2016-09-01 NOTE — Telephone Encounter (Signed)
Called and states she received fax yesterday.

## 2016-09-05 ENCOUNTER — Telehealth (INDEPENDENT_AMBULATORY_CARE_PROVIDER_SITE_OTHER): Payer: Self-pay

## 2016-09-05 DIAGNOSIS — N189 Chronic kidney disease, unspecified: Secondary | ICD-10-CM | POA: Diagnosis not present

## 2016-09-05 DIAGNOSIS — E1122 Type 2 diabetes mellitus with diabetic chronic kidney disease: Secondary | ICD-10-CM | POA: Diagnosis not present

## 2016-09-05 DIAGNOSIS — I5032 Chronic diastolic (congestive) heart failure: Secondary | ICD-10-CM | POA: Diagnosis not present

## 2016-09-05 DIAGNOSIS — J439 Emphysema, unspecified: Secondary | ICD-10-CM | POA: Diagnosis not present

## 2016-09-05 DIAGNOSIS — R1310 Dysphagia, unspecified: Secondary | ICD-10-CM | POA: Diagnosis not present

## 2016-09-05 DIAGNOSIS — I130 Hypertensive heart and chronic kidney disease with heart failure and stage 1 through stage 4 chronic kidney disease, or unspecified chronic kidney disease: Secondary | ICD-10-CM | POA: Diagnosis not present

## 2016-09-05 DIAGNOSIS — S82201D Unspecified fracture of shaft of right tibia, subsequent encounter for closed fracture with routine healing: Secondary | ICD-10-CM | POA: Diagnosis not present

## 2016-09-05 DIAGNOSIS — S82401D Unspecified fracture of shaft of right fibula, subsequent encounter for closed fracture with routine healing: Secondary | ICD-10-CM | POA: Diagnosis not present

## 2016-09-05 DIAGNOSIS — E1140 Type 2 diabetes mellitus with diabetic neuropathy, unspecified: Secondary | ICD-10-CM | POA: Diagnosis not present

## 2016-09-05 NOTE — Telephone Encounter (Signed)
Tiffany [name] with [facility] called needing verbal orders to extend HHPT for 2x/wk for 4 more weeks to assist with transfer skills. Verbal orders given.

## 2016-09-06 ENCOUNTER — Other Ambulatory Visit: Payer: Self-pay | Admitting: *Deleted

## 2016-09-06 DIAGNOSIS — E1122 Type 2 diabetes mellitus with diabetic chronic kidney disease: Secondary | ICD-10-CM | POA: Diagnosis not present

## 2016-09-06 DIAGNOSIS — I5032 Chronic diastolic (congestive) heart failure: Secondary | ICD-10-CM | POA: Diagnosis not present

## 2016-09-06 DIAGNOSIS — I130 Hypertensive heart and chronic kidney disease with heart failure and stage 1 through stage 4 chronic kidney disease, or unspecified chronic kidney disease: Secondary | ICD-10-CM | POA: Diagnosis not present

## 2016-09-06 DIAGNOSIS — S82401D Unspecified fracture of shaft of right fibula, subsequent encounter for closed fracture with routine healing: Secondary | ICD-10-CM | POA: Diagnosis not present

## 2016-09-06 DIAGNOSIS — E1140 Type 2 diabetes mellitus with diabetic neuropathy, unspecified: Secondary | ICD-10-CM | POA: Diagnosis not present

## 2016-09-06 DIAGNOSIS — R1310 Dysphagia, unspecified: Secondary | ICD-10-CM | POA: Diagnosis not present

## 2016-09-06 DIAGNOSIS — S82201D Unspecified fracture of shaft of right tibia, subsequent encounter for closed fracture with routine healing: Secondary | ICD-10-CM | POA: Diagnosis not present

## 2016-09-06 DIAGNOSIS — N189 Chronic kidney disease, unspecified: Secondary | ICD-10-CM | POA: Diagnosis not present

## 2016-09-06 DIAGNOSIS — J439 Emphysema, unspecified: Secondary | ICD-10-CM | POA: Diagnosis not present

## 2016-09-06 NOTE — Patient Outreach (Signed)
[facility]) Care Management  09/06/2016  Tiffany [name] 01/21/1960 [id number]   Weekly transition of care call placed to member, no answer, HIPAA compliant voice message left.  Will await call back, if no call back, will follow up next week.  [name], [place], MSN [place] [phone]

## 2016-09-07 ENCOUNTER — Ambulatory Visit (INDEPENDENT_AMBULATORY_CARE_PROVIDER_SITE_OTHER): Payer: Medicare HMO | Admitting: Family Medicine

## 2016-09-07 ENCOUNTER — Encounter: Payer: Self-pay | Admitting: Internal Medicine

## 2016-09-07 ENCOUNTER — Encounter: Payer: Self-pay | Admitting: Family Medicine

## 2016-09-07 ENCOUNTER — Ambulatory Visit (INDEPENDENT_AMBULATORY_CARE_PROVIDER_SITE_OTHER): Payer: Medicare HMO | Admitting: Internal Medicine

## 2016-09-07 VITALS — BP 110/70 | HR 82 | Temp 98.7°F | Ht 64.0 in

## 2016-09-07 DIAGNOSIS — R0602 Shortness of breath: Secondary | ICD-10-CM

## 2016-09-07 DIAGNOSIS — E669 Obesity, unspecified: Secondary | ICD-10-CM | POA: Diagnosis not present

## 2016-09-07 DIAGNOSIS — J42 Unspecified chronic bronchitis: Secondary | ICD-10-CM

## 2016-09-07 DIAGNOSIS — I5032 Chronic diastolic (congestive) heart failure: Secondary | ICD-10-CM

## 2016-09-07 DIAGNOSIS — E118 Type 2 diabetes mellitus with unspecified complications: Secondary | ICD-10-CM

## 2016-09-07 DIAGNOSIS — E119 Type 2 diabetes mellitus without complications: Secondary | ICD-10-CM

## 2016-09-07 DIAGNOSIS — I10 Essential (primary) hypertension: Secondary | ICD-10-CM

## 2016-09-07 MED ORDER — ALBUTEROL SULFATE (2.5 MG/3ML) 0.083% IN NEBU
2.5000 mg | INHALATION_SOLUTION | Freq: Once | RESPIRATORY_TRACT | Status: AC
  Administered 2016-09-07: 2.5 mg via RESPIRATORY_TRACT

## 2016-09-07 MED ORDER — NAPROXEN 500 MG PO TBEC: 500.0000 mg | DELAYED_RELEASE_TABLET | Freq: Two times a day (BID) | ORAL | 1 refills | Status: DC

## 2016-09-07 MED ORDER — IPRATROPIUM BROMIDE 0.02 % IN SOLN
0.5000 mg | Freq: Once | RESPIRATORY_TRACT | Status: AC
  Administered 2016-09-07: 0.5 mg via RESPIRATORY_TRACT

## 2016-09-07 NOTE — Patient Instructions (Addendum)
-   PLEASE SHARE WITH YOUR SON (OR ANYONE WHO HELPS YOU OUT WITH GETTING GROCERIES OR MEALS:  - Once you get your meter fixed, start checking fasting blood sugar (1st thing in the morning, before you eat or drink anything).  Write down your number on the Blood Sugar form provided today.  Bring this form EACH time you see the doctor.   Goals:  - Eat breakfast, lunch and dinner each day.    - Try to get at least one serving of vegetables each day.    - Fresh fruit will be a better choice for you instead of ice cream sandwiches or other packaged snacks.  Please take advantage of the mobile food market the fourth Tuesday discussed today (see flier provided) in which you can get some fresh produce.  Also, see the flier provided about the [place] program every Thursday.    - Reduce soda soda intake to no more than 16 oz 2 X day.  Even less is going to be better for your blood sugar.  Your usual intake now is 16 oz 4 X day.    - Some good sample meals for you: - Breakfast: 1-2 eggs, 1 slice of toast, fresh fruit - Lunch:  Meat or tunafish sandwich, sliced tomatoes and cucumbers, fresh fruit - Dinner:  Meat or fish, vegetable of your choice, up to 1 cup of rice OR pasta OR potatoes OR a small serving of another starchy food.    - Please bring your glucometer in, and see our pharmD Dr. Valentina Lucks on Monday, Aug 27 at 1:30 PM.    - Call Dr. Jenne Campus if you would like to schedule a follow-up nutrition appointment:  [phone].    - (Your son's appt is at 10:30 AM Monday with Thomasene Lot.)

## 2016-09-07 NOTE — Progress Notes (Signed)
   [name] [name], MD Phone: [phone]  Reason For Visit: Follow up   # Congestive Heart Failure  - Feeling slightly distended in her abdomen compared to previous - Patient states that she is not having worsening breathing; she decreased her oxygen briefly when coming into our office because she was concerned about running out. However we were able to give her oxygen in the office thank you provider 6 L. - Still unable to obtain weights on patient, given that she is completely nonweightbearing -Patient has still not been taking her torsemide, explained to patient that if she had any shortness of breath she should restart it or leg swelling/worsening distention. Patient suppose to see orthopedics tomorrow and will no longer be non-weight bearing at that time  - No chest pain or   #COPD - Patient has been taking her Breo-Ellipta and Spiriva. - Last week she had a episode of shortness of breath where she called EMS and received a DuoNeb; following that she improved - Patient is not needed albuterol treatments recently  #CHRONIC HTN: Current Meds - Norvasc  Reports good compliance, took meds today. Tolerating well, w/o complaints. Denies CP, HA, dizziness / lightheadedness  #CHRONIC DM, Type 2: Reports no issues with that medication  Meds: metformin 500 mg BID  Reports good compliance. Tolerating well w/o side-effects Denies polyuria, visual changes, numbness or tingling.    Past Medical History Reviewed problem list.  Medications- reviewed and updated No additions to family history Social history- patient is a recent quit smoking  Objective: BP 110/70   Pulse 82   Temp 98.7 F (37.1 C) (Oral)   Ht 5\' 4"  (1.626 m)   SpO2 98%  Gen: NAD, alert, cooperative with exam HEENT: Normal    Neck: No masses palpated. No lymphadenopathy, No JVD noted, thought neck thick     Nose: nasal turbinates moist    Throat: moist mucus membranes, no  erythema Cardio: regular rate and rhythm, S1S2 heard, no murmurs appreciated Pulm: intermittent wheezes throughout lung fields, currently 6 L,no increased WOB  Abdomen: slight distention  Extremities: warm, well perfused, No edema noted in lower legs or thighs  Skin: dry, intact, no rashes or lesions   Assessment/Plan: See problem based a/p  Chronic diastolic heart failure (HCC) Diastolic heart failure. Indicates some slight abdominal distention, no crackles, no edema noted. Patient continues to be non-compliant with take her toresmide. She denies any worsening SOB. Not requiring anymore oxygen. Still has not followed up with cardiology.  - Recommended patient restart toresmide as soon as possible - Patient plans restart as soon as she gets orthopedics allows her bare weight on her leg - Will obtain BMET to check potassium   COPD (chronic obstructive pulmonary disease) ([place]) Restrictive/Obstructive disease. Noted some wheezing throughout lung fields on exam, thought patient denied any worsening of SOB, discussed with patient findings. Patient denied any increased sputum production, cough, or SOB. Provided Duoneb with much improvement in lung fields.  -  Recommended patient continue controller meds and albuterol every 6 hours while patient is awake for the next 24-48 hours  - Follow up if patient develops symptoms   Benign essential HTN Blood pressure well controlled  Norvasc   Type 2 diabetes mellitus ([place]) Well controlled  Continue Metformin 500 mg BID  A1C pending

## 2016-09-07 NOTE — Progress Notes (Signed)
Medical Nutrition Therapy:  Appt start time: 1430 end time:  0277. PCP: Kathrine Cords, MD Therapist: Francee Piccolo, [place]   Assessment:  Primary concerns today: Weight management and Blood sugar control.  Ms. Salah was accompanied at today's appt by her mom, who helps her with transportation and sometimes with food shopping.  Ms. Caughlin has home from the nursing home for about a month now, following a broken tibia.  [name] doing rehab 1 X wk, but so far with no weight bearing.    Not checking FBG b/c her meter is not working.  She said she did not know that all the soda she is consuming (16 oz 4 X day) is bad for her BG, and further stated that she is the kind of person who needs to be shown proof of something before she believes it.  She seemed to remember that when she was checking BG levels, soda intake did not affect them.  This prompted a recommendation to bring her meter in for a check, and she and her mom agreed to see Dr. Valentina Lucks, who offered to see them for this next Monday.    It was apparent in today's appt that food insecurity is a factor in Ms. Hommes's food choices, e.g., when fruit was suggested as an alternative snack to ice cream sandwiches, she responded that "fruit is a lot higher."  Our social worker Casimer Lanius helped today by providing written information as to services where fresh produce can be obtained.    24-hr recall:  (Up at 10:30 AM) B ( AM)-   Snk (11 AM)-  16 oz Pepsi, 2 cheese toast L (12 PM)-  2 ice crm sandwiches, 16 oz Pepsi Snk (6 PM)-  2 McD fish sandwiches, 16 oz Mtn Dew D (8:30 PM)-  1 hot dog, relish, slaw, chili, on, must, 1 slc cheese cake, 16 oz Mtn Dew Snk ( PM)-  ---   Typical day? Yes.    Progress Towards Goal(s):  In progress.   Nutritional Diagnosis:  NB-1.1 Food and nutrition-related knowledge deficit As related to weight management and BG control.  As evidenced by patient acknowledgement.    Intervention:   Nutrition education.  Handouts given during visit include:  AVS  Information on local sources of free food  Demonstrated degree of understanding via:  Teach Back  Barriers to learning/adherence to lifestyle change: Knowledge deficit as well as disinterest in food preparation and food insecurity.   Monitoring/Evaluation:  Dietary intake, exercise, FBG, and body weight prn.

## 2016-09-07 NOTE — Patient Instructions (Signed)
As soon as  you are able to restart your torsemide please do even if it is just every other day. I think you should take albuterol inhaler for the next 24 hours every 6 hours while you're awake. I want you to follow up with me in about 2 weeks or sooner if you need to.

## 2016-09-08 ENCOUNTER — Emergency Department (HOSPITAL_COMMUNITY): Payer: Medicare HMO

## 2016-09-08 ENCOUNTER — Inpatient Hospital Stay (HOSPITAL_COMMUNITY): Payer: Medicare HMO

## 2016-09-08 ENCOUNTER — Inpatient Hospital Stay (HOSPITAL_COMMUNITY)
Admission: EM | Admit: 2016-09-08 | Discharge: 2016-09-21 | DRG: 207 | Disposition: A | Discharge status: Home Health | Payer: Medicare HMO | Attending: Family Medicine | Admitting: Family Medicine

## 2016-09-08 ENCOUNTER — Ambulatory Visit (INDEPENDENT_AMBULATORY_CARE_PROVIDER_SITE_OTHER): Payer: Medicare HMO | Admitting: Orthopaedic Surgery

## 2016-09-08 ENCOUNTER — Ambulatory Visit: Payer: Self-pay | Admitting: *Deleted

## 2016-09-08 ENCOUNTER — Encounter (HOSPITAL_COMMUNITY): Payer: Self-pay | Admitting: Emergency Medicine

## 2016-09-08 DIAGNOSIS — I5033 Acute on chronic diastolic (congestive) heart failure: Secondary | ICD-10-CM

## 2016-09-08 DIAGNOSIS — G931 Anoxic brain damage, not elsewhere classified: Secondary | ICD-10-CM | POA: Diagnosis present

## 2016-09-08 DIAGNOSIS — Z7982 Long term (current) use of aspirin: Secondary | ICD-10-CM

## 2016-09-08 DIAGNOSIS — N179 Acute kidney failure, unspecified: Secondary | ICD-10-CM

## 2016-09-08 DIAGNOSIS — I5023 Acute on chronic systolic (congestive) heart failure: Secondary | ICD-10-CM | POA: Diagnosis not present

## 2016-09-08 DIAGNOSIS — J9621 Acute and chronic respiratory failure with hypoxia: Secondary | ICD-10-CM | POA: Diagnosis present

## 2016-09-08 DIAGNOSIS — G4733 Obstructive sleep apnea (adult) (pediatric): Secondary | ICD-10-CM | POA: Diagnosis present

## 2016-09-08 DIAGNOSIS — J9601 Acute respiratory failure with hypoxia: Secondary | ICD-10-CM

## 2016-09-08 DIAGNOSIS — J9612 Chronic respiratory failure with hypercapnia: Secondary | ICD-10-CM | POA: Diagnosis not present

## 2016-09-08 DIAGNOSIS — Z7951 Long term (current) use of inhaled steroids: Secondary | ICD-10-CM

## 2016-09-08 DIAGNOSIS — I509 Heart failure, unspecified: Secondary | ICD-10-CM | POA: Diagnosis not present

## 2016-09-08 DIAGNOSIS — Z818 Family history of other mental and behavioral disorders: Secondary | ICD-10-CM | POA: Diagnosis not present

## 2016-09-08 DIAGNOSIS — I10 Essential (primary) hypertension: Secondary | ICD-10-CM

## 2016-09-08 DIAGNOSIS — Z9911 Dependence on respirator [ventilator] status: Secondary | ICD-10-CM

## 2016-09-08 DIAGNOSIS — I517 Cardiomegaly: Secondary | ICD-10-CM | POA: Diagnosis not present

## 2016-09-08 DIAGNOSIS — R0682 Tachypnea, not elsewhere classified: Secondary | ICD-10-CM | POA: Diagnosis not present

## 2016-09-08 DIAGNOSIS — M25579 Pain in unspecified ankle and joints of unspecified foot: Secondary | ICD-10-CM

## 2016-09-08 DIAGNOSIS — I130 Hypertensive heart and chronic kidney disease with heart failure and stage 1 through stage 4 chronic kidney disease, or unspecified chronic kidney disease: Secondary | ICD-10-CM | POA: Diagnosis present

## 2016-09-08 DIAGNOSIS — Z4682 Encounter for fitting and adjustment of non-vascular catheter: Secondary | ICD-10-CM | POA: Diagnosis not present

## 2016-09-08 DIAGNOSIS — Z9289 Personal history of other medical treatment: Secondary | ICD-10-CM

## 2016-09-08 DIAGNOSIS — R062 Wheezing: Secondary | ICD-10-CM | POA: Diagnosis not present

## 2016-09-08 DIAGNOSIS — J9620 Acute and chronic respiratory failure, unspecified whether with hypoxia or hypercapnia: Secondary | ICD-10-CM | POA: Diagnosis not present

## 2016-09-08 DIAGNOSIS — E872 Acidosis: Secondary | ICD-10-CM | POA: Diagnosis present

## 2016-09-08 DIAGNOSIS — W1839XA Other fall on same level, initial encounter: Secondary | ICD-10-CM | POA: Diagnosis not present

## 2016-09-08 DIAGNOSIS — S82831S Other fracture of upper and lower end of right fibula, sequela: Secondary | ICD-10-CM | POA: Diagnosis not present

## 2016-09-08 DIAGNOSIS — I252 Old myocardial infarction: Secondary | ICD-10-CM

## 2016-09-08 DIAGNOSIS — J80 Acute respiratory distress syndrome: Secondary | ICD-10-CM | POA: Diagnosis not present

## 2016-09-08 DIAGNOSIS — Y92230 Patient room in hospital as the place of occurrence of the external cause: Secondary | ICD-10-CM | POA: Diagnosis not present

## 2016-09-08 DIAGNOSIS — Z9981 Dependence on supplemental oxygen: Secondary | ICD-10-CM

## 2016-09-08 DIAGNOSIS — Z9889 Other specified postprocedural states: Secondary | ICD-10-CM

## 2016-09-08 DIAGNOSIS — J9602 Acute respiratory failure with hypercapnia: Secondary | ICD-10-CM

## 2016-09-08 DIAGNOSIS — I2510 Atherosclerotic heart disease of native coronary artery without angina pectoris: Secondary | ICD-10-CM | POA: Diagnosis present

## 2016-09-08 DIAGNOSIS — G9340 Encephalopathy, unspecified: Secondary | ICD-10-CM | POA: Diagnosis not present

## 2016-09-08 DIAGNOSIS — E785 Hyperlipidemia, unspecified: Secondary | ICD-10-CM | POA: Diagnosis present

## 2016-09-08 DIAGNOSIS — D649 Anemia, unspecified: Secondary | ICD-10-CM | POA: Diagnosis present

## 2016-09-08 DIAGNOSIS — K219 Gastro-esophageal reflux disease without esophagitis: Secondary | ICD-10-CM | POA: Diagnosis present

## 2016-09-08 DIAGNOSIS — Z87891 Personal history of nicotine dependence: Secondary | ICD-10-CM

## 2016-09-08 DIAGNOSIS — I469 Cardiac arrest, cause unspecified: Secondary | ICD-10-CM | POA: Diagnosis present

## 2016-09-08 DIAGNOSIS — F319 Bipolar disorder, unspecified: Secondary | ICD-10-CM | POA: Diagnosis present

## 2016-09-08 DIAGNOSIS — Z6841 Body Mass Index (BMI) 40.0 and over, adult: Secondary | ICD-10-CM | POA: Diagnosis not present

## 2016-09-08 DIAGNOSIS — K5900 Constipation, unspecified: Secondary | ICD-10-CM | POA: Diagnosis not present

## 2016-09-08 DIAGNOSIS — J9600 Acute respiratory failure, unspecified whether with hypoxia or hypercapnia: Secondary | ICD-10-CM | POA: Diagnosis not present

## 2016-09-08 DIAGNOSIS — J9690 Respiratory failure, unspecified, unspecified whether with hypoxia or hypercapnia: Secondary | ICD-10-CM | POA: Diagnosis not present

## 2016-09-08 DIAGNOSIS — E1122 Type 2 diabetes mellitus with diabetic chronic kidney disease: Secondary | ICD-10-CM | POA: Diagnosis present

## 2016-09-08 DIAGNOSIS — M79661 Pain in right lower leg: Secondary | ICD-10-CM | POA: Diagnosis not present

## 2016-09-08 DIAGNOSIS — R402432 Glasgow coma scale score 3-8, at arrival to emergency department: Secondary | ICD-10-CM | POA: Diagnosis present

## 2016-09-08 DIAGNOSIS — J440 Chronic obstructive pulmonary disease with acute lower respiratory infection: Secondary | ICD-10-CM | POA: Diagnosis not present

## 2016-09-08 DIAGNOSIS — E876 Hypokalemia: Secondary | ICD-10-CM | POA: Diagnosis present

## 2016-09-08 DIAGNOSIS — R0602 Shortness of breath: Secondary | ICD-10-CM | POA: Diagnosis not present

## 2016-09-08 DIAGNOSIS — F200 Paranoid schizophrenia: Secondary | ICD-10-CM | POA: Diagnosis present

## 2016-09-08 DIAGNOSIS — S99912A Unspecified injury of left ankle, initial encounter: Secondary | ICD-10-CM | POA: Diagnosis not present

## 2016-09-08 DIAGNOSIS — N183 Chronic kidney disease, stage 3 (moderate): Secondary | ICD-10-CM | POA: Diagnosis present

## 2016-09-08 DIAGNOSIS — Z79899 Other long term (current) drug therapy: Secondary | ICD-10-CM

## 2016-09-08 DIAGNOSIS — R918 Other nonspecific abnormal finding of lung field: Secondary | ICD-10-CM | POA: Diagnosis not present

## 2016-09-08 DIAGNOSIS — Z7984 Long term (current) use of oral hypoglycemic drugs: Secondary | ICD-10-CM

## 2016-09-08 DIAGNOSIS — S82221S Displaced transverse fracture of shaft of right tibia, sequela: Secondary | ICD-10-CM | POA: Diagnosis not present

## 2016-09-08 DIAGNOSIS — M6281 Muscle weakness (generalized): Secondary | ICD-10-CM | POA: Diagnosis not present

## 2016-09-08 DIAGNOSIS — M25572 Pain in left ankle and joints of left foot: Secondary | ICD-10-CM | POA: Diagnosis not present

## 2016-09-08 DIAGNOSIS — I110 Hypertensive heart disease with heart failure: Secondary | ICD-10-CM | POA: Diagnosis not present

## 2016-09-08 DIAGNOSIS — I272 Pulmonary hypertension, unspecified: Secondary | ICD-10-CM | POA: Diagnosis present

## 2016-09-08 DIAGNOSIS — J810 Acute pulmonary edema: Secondary | ICD-10-CM | POA: Diagnosis not present

## 2016-09-08 DIAGNOSIS — E1140 Type 2 diabetes mellitus with diabetic neuropathy, unspecified: Secondary | ICD-10-CM | POA: Diagnosis present

## 2016-09-08 DIAGNOSIS — J441 Chronic obstructive pulmonary disease with (acute) exacerbation: Principal | ICD-10-CM | POA: Diagnosis present

## 2016-09-08 DIAGNOSIS — Z8673 Personal history of transient ischemic attack (TIA), and cerebral infarction without residual deficits: Secondary | ICD-10-CM | POA: Diagnosis not present

## 2016-09-08 DIAGNOSIS — J449 Chronic obstructive pulmonary disease, unspecified: Secondary | ICD-10-CM | POA: Diagnosis not present

## 2016-09-08 DIAGNOSIS — Z90710 Acquired absence of both cervix and uterus: Secondary | ICD-10-CM

## 2016-09-08 DIAGNOSIS — J45909 Unspecified asthma, uncomplicated: Secondary | ICD-10-CM | POA: Diagnosis not present

## 2016-09-08 DIAGNOSIS — J189 Pneumonia, unspecified organism: Secondary | ICD-10-CM | POA: Diagnosis not present

## 2016-09-08 HISTORY — DX: Cardiac arrest, cause unspecified: I46.9

## 2016-09-08 HISTORY — DX: Anoxic brain damage, not elsewhere classified: G93.1

## 2016-09-08 LAB — COMPREHENSIVE METABOLIC PANEL
ALT: 17 U/L (ref 14–54)
AST: 42 U/L — ABNORMAL HIGH (ref 15–41)
Albumin: 2.8 g/dL — ABNORMAL LOW (ref 3.5–5.0)
Alkaline Phosphatase: 128 U/L — ABNORMAL HIGH (ref 38–126)
Anion gap: 17 — ABNORMAL HIGH (ref 5–15)
BUN: 12 mg/dL (ref 6–20)
CO2: 28 mmol/L (ref 22–32)
Calcium: 8.7 mg/dL — ABNORMAL LOW (ref 8.9–10.3)
Chloride: 100 mmol/L — ABNORMAL LOW (ref 101–111)
Creatinine, Ser: 1.38 mg/dL — ABNORMAL HIGH (ref 0.44–1.00)
GFR calc Af Amer: 48 mL/min — ABNORMAL LOW (ref >60)
GFR calc non Af Amer: 42 mL/min — ABNORMAL LOW (ref >60)
Glucose, Bld: 276 mg/dL — ABNORMAL HIGH (ref 65–99)
Potassium: 3.4 mmol/L — ABNORMAL LOW (ref 3.5–5.1)
Sodium: 145 mmol/L (ref 135–145)
Total Bilirubin: 0.4 mg/dL (ref 0.3–1.2)
Total Protein: 6.2 g/dL — ABNORMAL LOW (ref 6.5–8.1)

## 2016-09-08 LAB — GLUCOSE, CAPILLARY
Glucose-Capillary: 123 mg/dL — ABNORMAL HIGH (ref 65–99)
Glucose-Capillary: 112 mg/dL — ABNORMAL HIGH (ref 65–99)

## 2016-09-08 LAB — ECHOCARDIOGRAM COMPLETE
Height: 64 in
Weight: 4768 oz

## 2016-09-08 LAB — BLOOD GAS, ARTERIAL
Acid-Base Excess: 6.9 mmol/L — ABNORMAL HIGH (ref 0.0–2.0)
Bicarbonate: 32.6 mmol/L — ABNORMAL HIGH (ref 20.0–28.0)
Drawn by: 313941
FIO2: 100
MECHVT: 500 mL
O2 Saturation: 97.7 %
PEEP: 10 cmH2O
Patient temperature: 98.7
RATE: 26 resp/min
pCO2 arterial: 62.2 mmHg — ABNORMAL HIGH (ref 32.0–48.0)
pH, Arterial: 7.339 — ABNORMAL LOW (ref 7.350–7.450)
pO2, Arterial: 106 mmHg (ref 83.0–108.0)

## 2016-09-08 LAB — BASIC METABOLIC PANEL
Anion gap: 13 (ref 5–15)
BUN/Creatinine Ratio: 16 (ref 9–23)
BUN: 15 mg/dL (ref 6–24)
BUN: 19 mg/dL (ref 6–20)
CO2: 34 mmol/L — ABNORMAL HIGH (ref 20–29)
CO2: 29 mmol/L (ref 22–32)
Calcium: 9.4 mg/dL (ref 8.7–10.2)
Calcium: 8.8 mg/dL — ABNORMAL LOW (ref 8.9–10.3)
Chloride: 99 mmol/L (ref 96–106)
Chloride: 101 mmol/L (ref 101–111)
Creatinine, Ser: 0.94 mg/dL (ref 0.57–1.00)
Creatinine, Ser: 1.66 mg/dL — ABNORMAL HIGH (ref 0.44–1.00)
GFR calc Af Amer: 78 mL/min/{1.73_m2} (ref >59)
GFR calc Af Amer: 39 mL/min — ABNORMAL LOW (ref >60)
GFR calc non Af Amer: 68 mL/min/{1.73_m2} (ref >59)
GFR calc non Af Amer: 33 mL/min — ABNORMAL LOW (ref >60)
Glucose, Bld: 161 mg/dL — ABNORMAL HIGH (ref 65–99)
Glucose: 75 mg/dL (ref 65–99)
Potassium: 4.7 mmol/L (ref 3.5–5.2)
Potassium: 4.3 mmol/L (ref 3.5–5.1)
Sodium: 145 mmol/L — ABNORMAL HIGH (ref 134–144)
Sodium: 143 mmol/L (ref 135–145)

## 2016-09-08 LAB — RESPIRATORY PANEL BY PCR

## 2016-09-08 LAB — I-STAT ARTERIAL BLOOD GAS, ED
Acid-Base Excess: 1 mmol/L (ref 0.0–2.0)
Bicarbonate: 30.8 mmol/L — ABNORMAL HIGH (ref 20.0–28.0)
O2 Saturation: 97 %
Patient temperature: 98.1
TCO2: 33 mmol/L — ABNORMAL HIGH (ref 22–32)
pCO2 arterial: 80.2 mmHg (ref 32.0–48.0)
pH, Arterial: 7.191 — CL (ref 7.350–7.450)
pO2, Arterial: 120 mmHg — ABNORMAL HIGH (ref 83.0–108.0)

## 2016-09-08 LAB — URINALYSIS, ROUTINE W REFLEX MICROSCOPIC
Bilirubin Urine: NEGATIVE
Glucose, UA: >=500 mg/dL — AB
Ketones, ur: NEGATIVE mg/dL
Leukocytes, UA: NEGATIVE
Nitrite: NEGATIVE
Protein, ur: >=300 mg/dL — AB
Specific Gravity, Urine: 1.006 (ref 1.005–1.030)
Squamous Epithelial / LPF: NONE SEEN
pH: 7 (ref 5.0–8.0)

## 2016-09-08 LAB — CBC WITH DIFFERENTIAL/PLATELET
Basophils Absolute: 0 10*3/uL (ref 0.0–0.1)
Basophils Relative: 0 %
Eosinophils Absolute: 0.6 10*3/uL (ref 0.0–0.7)
Eosinophils Relative: 3 %
HCT: 30.8 % — ABNORMAL LOW (ref 36.0–46.0)
Hemoglobin: 8.4 g/dL — ABNORMAL LOW (ref 12.0–15.0)
Lymphocytes Relative: 48 %
Lymphs Abs: 9.8 10*3/uL — ABNORMAL HIGH (ref 0.7–4.0)
MCH: 27 pg (ref 26.0–34.0)
MCHC: 27.3 g/dL — ABNORMAL LOW (ref 30.0–36.0)
MCV: 99 fL (ref 78.0–100.0)
Monocytes Absolute: 1 10*3/uL (ref 0.1–1.0)
Monocytes Relative: 5 %
Neutro Abs: 8.9 10*3/uL — ABNORMAL HIGH (ref 1.7–7.7)
Neutrophils Relative %: 44 %
Platelets: 364 10*3/uL (ref 150–400)
RBC: 3.11 MIL/uL — ABNORMAL LOW (ref 3.87–5.11)
RDW: 15.6 % — ABNORMAL HIGH (ref 11.5–15.5)
WBC: 20.3 10*3/uL — ABNORMAL HIGH (ref 4.0–10.5)

## 2016-09-08 LAB — LACTIC ACID, PLASMA
Lactic Acid, Venous: 5 mmol/L (ref 0.5–1.9)
Lactic Acid, Venous: 6.3 mmol/L (ref 0.5–1.9)

## 2016-09-08 LAB — RAPID URINE DRUG SCREEN, HOSP PERFORMED
Amphetamines: NOT DETECTED
Barbiturates: NOT DETECTED
Benzodiazepines: NOT DETECTED
Cocaine: NOT DETECTED
Opiates: NOT DETECTED
Tetrahydrocannabinol: NOT DETECTED

## 2016-09-08 LAB — PROTIME-INR
INR: 1.15
Prothrombin Time: 14.8 seconds (ref 11.4–15.2)

## 2016-09-08 LAB — I-STAT TROPONIN, ED: Troponin i, poc: 0.02 ng/mL (ref 0.00–0.08)

## 2016-09-08 LAB — PROCALCITONIN: Procalcitonin: <0.1 ng/mL

## 2016-09-08 LAB — CBG MONITORING, ED: Glucose-Capillary: 200 mg/dL — ABNORMAL HIGH (ref 65–99)

## 2016-09-08 LAB — BRAIN NATRIURETIC PEPTIDE: B Natriuretic Peptide: 144.4 pg/mL — ABNORMAL HIGH (ref 0.0–100.0)

## 2016-09-08 LAB — MRSA PCR SCREENING: MRSA by PCR: NEGATIVE

## 2016-09-08 MED ORDER — PROPOFOL 1000 MG/100ML IV EMUL: 0.0000 ug/kg/min | INTRAVENOUS | Status: DC

## 2016-09-08 MED ORDER — PERFLUTREN LIPID MICROSPHERE
INTRAVENOUS | Status: AC
  Filled 2016-09-08: qty 10

## 2016-09-08 MED ORDER — MIDAZOLAM HCL 2 MG/2ML IJ SOLN
2.0000 mg | INTRAMUSCULAR | Status: DC | PRN
  Administered 2016-09-08 – 2016-09-14 (×21): 2 mg via INTRAVENOUS
  Filled 2016-09-08 (×21): qty 2

## 2016-09-08 MED ORDER — FENTANYL 2500MCG IN NS 250ML (10MCG/ML) PREMIX INFUSION
25.0000 ug/h | INTRAVENOUS | Status: DC
  Administered 2016-09-08: 400 ug/h via INTRAVENOUS
  Administered 2016-09-08: 50 ug/h via INTRAVENOUS
  Administered 2016-09-08 – 2016-09-10 (×6): 400 ug/h via INTRAVENOUS
  Administered 2016-09-10: 300 ug/h via INTRAVENOUS
  Administered 2016-09-10: 400 ug/h via INTRAVENOUS
  Administered 2016-09-11: 350 ug/h via INTRAVENOUS
  Administered 2016-09-11: 400 ug/h via INTRAVENOUS
  Administered 2016-09-12 (×2): 250 ug/h via INTRAVENOUS
  Administered 2016-09-13: 300 ug/h via INTRAVENOUS
  Administered 2016-09-13 – 2016-09-14 (×2): 150 ug/h via INTRAVENOUS
  Filled 2016-09-08 (×18): qty 250

## 2016-09-08 MED ORDER — ORAL CARE MOUTH RINSE
15.0000 mL | Freq: Four times a day (QID) | OROMUCOSAL | Status: DC
  Administered 2016-09-09 – 2016-09-14 (×22): 15 mL via OROMUCOSAL

## 2016-09-08 MED ORDER — PANTOPRAZOLE SODIUM 40 MG IV SOLR
40.0000 mg | Freq: Every day | INTRAVENOUS | Status: DC
  Administered 2016-09-08 – 2016-09-11 (×4): 40 mg via INTRAVENOUS
  Filled 2016-09-08 (×4): qty 40

## 2016-09-08 MED ORDER — IPRATROPIUM BROMIDE 0.02 % IN SOLN
1.0000 mg | Freq: Once | RESPIRATORY_TRACT | Status: DC
  Filled 2016-09-08: qty 5

## 2016-09-08 MED ORDER — SODIUM BICARBONATE 8.4 % IV SOLN
INTRAVENOUS | Status: AC | PRN
  Administered 2016-09-08 (×2): 50 meq via INTRAVENOUS

## 2016-09-08 MED ORDER — PROPOFOL 1000 MG/100ML IV EMUL
INTRAVENOUS | Status: AC
  Filled 2016-09-08: qty 100

## 2016-09-08 MED ORDER — FUROSEMIDE 10 MG/ML IJ SOLN
40.0000 mg | Freq: Once | INTRAMUSCULAR | Status: AC
  Administered 2016-09-08: 40 mg via INTRAVENOUS
  Filled 2016-09-08: qty 4

## 2016-09-08 MED ORDER — POTASSIUM CHLORIDE 20 MEQ/15ML (10%) PO SOLN
20.0000 meq | ORAL | Status: AC
  Administered 2016-09-08 (×2): 20 meq via ORAL
  Filled 2016-09-08 (×2): qty 15

## 2016-09-08 MED ORDER — IPRATROPIUM-ALBUTEROL 0.5-2.5 (3) MG/3ML IN SOLN
3.0000 mL | RESPIRATORY_TRACT | Status: DC
  Filled 2016-09-08: qty 9

## 2016-09-08 MED ORDER — FENTANYL CITRATE (PF) 100 MCG/2ML IJ SOLN
50.0000 ug | Freq: Once | INTRAMUSCULAR | Status: AC
  Administered 2016-09-08: 50 ug via INTRAVENOUS

## 2016-09-08 MED ORDER — ALBUTEROL (5 MG/ML) CONTINUOUS INHALATION SOLN
15.0000 mg/h | INHALATION_SOLUTION | Freq: Once | RESPIRATORY_TRACT | Status: DC
  Filled 2016-09-08: qty 20

## 2016-09-08 MED ORDER — FENTANYL CITRATE (PF) 100 MCG/2ML IJ SOLN
100.0000 ug | INTRAMUSCULAR | Status: DC | PRN
Start: 2016-09-08 — End: 2016-09-08

## 2016-09-08 MED ORDER — PROPOFOL 1000 MG/100ML IV EMUL
5.0000 ug/kg/min | Freq: Once | INTRAVENOUS | Status: DC
  Administered 2016-09-08: 40 ug/kg/min via INTRAVENOUS

## 2016-09-08 MED ORDER — BUDESONIDE 0.25 MG/2ML IN SUSP: 0.2500 mg | Freq: Two times a day (BID) | RESPIRATORY_TRACT | Status: DC

## 2016-09-08 MED ORDER — PERFLUTREN LIPID MICROSPHERE
1.0000 mL | INTRAVENOUS | Status: AC | PRN
  Administered 2016-09-08: 2 mL via INTRAVENOUS
  Filled 2016-09-08: qty 10

## 2016-09-08 MED ORDER — DEXTROSE 5 % IV SOLN
500.0000 mg | INTRAVENOUS | Status: DC
  Administered 2016-09-08 – 2016-09-12 (×5): 500 mg via INTRAVENOUS
  Filled 2016-09-08 (×5): qty 500

## 2016-09-08 MED ORDER — BUDESONIDE 0.5 MG/2ML IN SUSP
0.5000 mg | Freq: Two times a day (BID) | RESPIRATORY_TRACT | Status: DC
  Administered 2016-09-08 – 2016-09-21 (×26): 0.5 mg via RESPIRATORY_TRACT
  Filled 2016-09-08 (×28): qty 2

## 2016-09-08 MED ORDER — FENTANYL BOLUS VIA INFUSION
50.0000 ug | INTRAVENOUS | Status: DC | PRN
  Administered 2016-09-08 – 2016-09-13 (×15): 50 ug via INTRAVENOUS
  Filled 2016-09-08: qty 50

## 2016-09-08 MED ORDER — FENTANYL CITRATE (PF) 100 MCG/2ML IJ SOLN
100.0000 ug | INTRAMUSCULAR | Status: DC | PRN
  Administered 2016-09-08: 100 ug via INTRAVENOUS
  Filled 2016-09-08: qty 2

## 2016-09-08 MED ORDER — INSULIN ASPART 100 UNIT/ML ~~LOC~~ SOLN
2.0000 [IU] | SUBCUTANEOUS | Status: DC
  Administered 2016-09-08 – 2016-09-14 (×14): 2 [IU] via SUBCUTANEOUS

## 2016-09-08 MED ORDER — PROPOFOL 1000 MG/100ML IV EMUL
5.0000 ug/kg/min | Freq: Once | INTRAVENOUS | Status: AC
  Administered 2016-09-08: 30 ug/kg/min via INTRAVENOUS

## 2016-09-08 MED ORDER — IPRATROPIUM-ALBUTEROL 0.5-2.5 (3) MG/3ML IN SOLN
3.0000 mL | Freq: Four times a day (QID) | RESPIRATORY_TRACT | Status: DC
  Administered 2016-09-08 – 2016-09-16 (×33): 3 mL via RESPIRATORY_TRACT
  Filled 2016-09-08 (×32): qty 3

## 2016-09-08 MED ORDER — CHLORHEXIDINE GLUCONATE 0.12% ORAL RINSE (MEDLINE KIT)
15.0000 mL | Freq: Two times a day (BID) | OROMUCOSAL | Status: DC
  Administered 2016-09-08 – 2016-09-14 (×12): 15 mL via OROMUCOSAL

## 2016-09-08 MED ORDER — VITAL HIGH PROTEIN PO LIQD
1000.0000 mL | ORAL | Status: DC
  Administered 2016-09-08 – 2016-09-12 (×5): 1000 mL
  Administered 2016-09-13: 09:00:00
  Administered 2016-09-13: 1000 mL
  Filled 2016-09-08 (×2): qty 1000

## 2016-09-08 MED ORDER — MIDAZOLAM HCL 2 MG/2ML IJ SOLN
2.0000 mg | INTRAMUSCULAR | Status: AC | PRN
  Administered 2016-09-08 – 2016-09-09 (×3): 2 mg via INTRAVENOUS
  Filled 2016-09-08 (×3): qty 2

## 2016-09-08 MED ORDER — SODIUM CHLORIDE 0.9 % IV SOLN
INTRAVENOUS | Status: AC | PRN
  Administered 2016-09-08: 1000 mL via INTRAVENOUS

## 2016-09-08 MED ORDER — PRO-STAT SUGAR FREE PO LIQD
30.0000 mL | Freq: Three times a day (TID) | ORAL | Status: DC
  Administered 2016-09-08 – 2016-09-13 (×17): 30 mL
  Filled 2016-09-08 (×18): qty 30

## 2016-09-08 MED ORDER — FUROSEMIDE 10 MG/ML IJ SOLN: 40.0000 mg | Freq: Two times a day (BID) | INTRAMUSCULAR | Status: DC

## 2016-09-08 MED ORDER — SODIUM CHLORIDE 0.9 % IV SOLN
250.0000 mL | INTRAVENOUS | Status: DC | PRN
  Administered 2016-09-12: 250 mL via INTRAVENOUS

## 2016-09-08 MED ORDER — EPINEPHRINE PF 1 MG/10ML IJ SOSY
PREFILLED_SYRINGE | INTRAMUSCULAR | Status: AC | PRN
  Administered 2016-09-08 (×3): 1 mg via INTRAVENOUS

## 2016-09-08 MED ORDER — DEXTROSE 5 % IV SOLN
1.0000 g | INTRAVENOUS | Status: DC
  Administered 2016-09-08 – 2016-09-11 (×4): 1 g via INTRAVENOUS
  Filled 2016-09-08 (×4): qty 10

## 2016-09-08 MED ORDER — FUROSEMIDE 10 MG/ML IJ SOLN
40.0000 mg | Freq: Two times a day (BID) | INTRAMUSCULAR | Status: DC
  Administered 2016-09-08 – 2016-09-09 (×3): 40 mg via INTRAVENOUS
  Filled 2016-09-08 (×3): qty 4

## 2016-09-08 MED ORDER — HEPARIN SODIUM (PORCINE) 5000 UNIT/ML IJ SOLN
5000.0000 [IU] | Freq: Three times a day (TID) | INTRAMUSCULAR | Status: DC
  Administered 2016-09-08 – 2016-09-21 (×39): 5000 [IU] via SUBCUTANEOUS
  Filled 2016-09-08 (×40): qty 1

## 2016-09-08 NOTE — Progress Notes (Signed)
   09/08/16 0600  Clinical Encounter Type  Visited With Patient;Family;Health care provider  Visit Type Initial;Spiritual support;Code  Referral From Nurse;Physician  Consult/Referral To Chaplain  Spiritual Encounters  Spiritual Needs Emotional  Stress Factors  Patient Stress Factors None identified  Family Stress Factors Health changes;Lack of knowledge    Chaplain paged to Trauma B for post CPR/Code. Patient intubated and being cleaned up at this time. Escorted son and friend to consult B. Facilitated update with Dr. Leonides Schanz. Provided emotional support and ministry of presence. Andreu Drudge L. Volanda Napoleon, MDiv

## 2016-09-08 NOTE — ED Notes (Signed)
Notified MD of pt's hypotension. No further orders at this time

## 2016-09-08 NOTE — Assessment & Plan Note (Signed)
Restrictive/Obstructive disease. Noted some wheezing throughout lung fields on exam, thought patient denied any worsening of SOB, discussed with patient findings. Patient denied any increased sputum production, cough, or SOB. Provided Duoneb with much improvement in lung fields.  -  Recommended patient continue controller meds and albuterol every 6 hours while patient is awake for the next 24-48 hours  - Follow up if patient develops symptoms

## 2016-09-08 NOTE — Progress Notes (Signed)
  Echocardiogram 2D Echocardiogram has been performed.  Bobbye Charleston 09/08/2016, 11:40 AM

## 2016-09-08 NOTE — Assessment & Plan Note (Signed)
Blood pressure well controlled  Norvasc

## 2016-09-08 NOTE — Code Documentation (Signed)
CPR continues - pulse check PEA.

## 2016-09-08 NOTE — ED Notes (Signed)
ETT/OGT intact , temp foley inserted withy approx. 300 ml urine at drainage bag , IV sites intact , NP speaking with pt.'s family on plan of care .

## 2016-09-08 NOTE — Consult Note (Signed)
   [facility] Inpatient Consult   09/08/2016  Tiffany [name] 1960/10/10 [id number]     Made aware of hospitalization by [facility] RNCM. Tiffany [name] is active with [place] [facility] services.  Please see chart review tab then encounters for further patient outreach details from [facility] team.  Chart reviewed. Tiffany [name] remains in ICU at this time.  Left voicemail message for inpatient RNCM to make aware that Tiffany [name] is active with [place] [facility] program.  Will continue to follow.   Marthenia Rolling, MSN-Ed, RN,BSN [facility] Liaison [phone]

## 2016-09-08 NOTE — Assessment & Plan Note (Signed)
Well controlled  Continue Metformin 500 mg BID  A1C pending

## 2016-09-08 NOTE — Progress Notes (Signed)
Patient transported from the ED to 2M14. Patient tolerated well. Vital signs stable throughout. No complications. RT will continue to monitor.

## 2016-09-08 NOTE — Progress Notes (Signed)
Pharmacy Antibiotic Note  Tiffany [name] is a 56 y.o. female admitted on 09/08/2016 post- PEA cardiac arrest with concern for pneumonia.  Pharmacy has been consulted for ceftriaxone dosing.  Plan: Ceftriaxone 1g IV q 24 hours   Azithromycin 500mg  IV q24 hours   Height: 5\' 4"  (162.6 cm) Weight: 298 lb (135.2 kg) IBW/kg (Calculated) : 54.7  Temp (24hrs), Avg:98.2 F (36.8 C), Min:97.8 F (36.6 C), Max:98.8 F (37.1 C)   Recent Labs Lab 09/07/16 1648 09/08/16 0617  WBC  --  20.3*  CREATININE 0.94 1.38*    Estimated Creatinine Clearance: 61.7 mL/min (A) (by C-G formula based on SCr of 1.38 mg/dL (H)).    Allergies  Allergen Reactions  . Hydroxyzine Shortness Of Breath    -throat closed up  . Latuda [Lurasidone Hcl] Anaphylaxis  . Codeine Nausea And Vomiting  . Sulfa Antibiotics Itching   Antibiotics this admission: Rocephin 8/24 >> Azithromycin 8/24 >>  Cultures: 8/24 BCx:  8/24 Tracheal Asp: 8/24 MRSA PCR:  Thank you for allowing pharmacy to be a part of this patient's care.  Jalene Mullet, Pharm.D. PGY1 Pharmacy Resident 09/08/2016 10:26 AM Main Pharmacy: [phone]

## 2016-09-08 NOTE — Code Documentation (Signed)
Palpable pulse present , EKG completed , NS IV bolus infusing . ETT ( #7.5 ) intact .

## 2016-09-08 NOTE — ED Notes (Signed)
Family at bedside. 

## 2016-09-08 NOTE — Progress Notes (Signed)
Initial Nutrition Assessment  DOCUMENTATION CODES:  Morbid obesity  INTERVENTION:  Initiate TF via OGT with Vtial High Protein at goal rate of 45 ml/h (1080 ml per day) and Prostat 30 ml TID to provide 1380 kcals, 140 gm protein, 903 ml free water daily.  NUTRITION DIAGNOSIS:  Inadequate oral intake related to inability to eat as evidenced by NPO status.  GOAL:  Provide needs based on ASPEN/SCCM guidelines  MONITOR:  Diet advancement, Vent status, Labs, Weight trends, TF tolerance  REASON FOR ASSESSMENT:  Consult Enteral/tube feeding initiation and management  ASSESSMENT:  56 y/o female PMHx COPD, CHF, CKD, cocaine abuse, OSA on CPAP, DM, CAD. Recent R tibula/fibula fx s/p repair. 8/23 at PCP was asked to restart torsemide due to volume overload. 8/24 awoke with dyspnea and 02 sat of 60%. Upon EMS Arrival, pt was pulseless, cpr initiated w/ 10-15 minutes PEA. Intubated in ED. Worked up for pulmonary Edema. RD consulted for TF management.   Pt intubated, sedated. No family present to obtain any history from.   On propofol, however, per discussion with RN, this should be off within next couple hours. Will calculate TF w/o accounting for Propofol.   Abdomen: Mildy distended.   Patient is currently intubated on ventilator support MV: 13.1 L/min Temp (24hrs), Avg:98.3 F (36.8 C), Min:97.8 F (36.6 C), Max:98.8 F (37.1 C)  Propofol: 16.2-ml/hr, but RN titrating off  Per chart, the patient's weight seems to be stable short term, has weighed 298-302 for the last 3 months. However, has gained 10-20 lbs in the last 6 months and 40-50 lbs in the last year.   NFPE: WDL, morbidly obese.   Labs: Lactic Acid:5.0, Glu: 110-280, Albumin: 2.8, WBC:20.3 Meds: Lasix, insulin, KCL, PPI, IV Abx, Propofol,    Recent Labs Lab 09/07/16 1648 09/08/16 0617  NA 145* 145  K 4.7 3.4*  CL 99 100*  CO2 34* 28  BUN 15 12  CREATININE 0.94 1.38*  CALCIUM 9.4 8.7*  GLUCOSE 75 276*   Diet  Order:  Diet NPO time specified  Skin:  Reviewed, no issues  Last BM:  Unknown  Height:  Ht Readings from Last 1 Encounters:  09/08/16 5\' 4"  (1.626 m)   Weight:  Wt Readings from Last 1 Encounters:  09/08/16 298 lb (135.2 kg)   Wt Readings from Last 10 Encounters:  09/08/16 298 lb (135.2 kg)  07/16/16 (!) 302 lb 7.5 oz (137.2 kg)  07/05/16 298 lb (135.2 kg)  07/04/16 299 lb (135.6 kg)  06/28/16 299 lb 12.8 oz (136 kg)  06/28/16 (!) 303 lb (137.4 kg)  06/27/16 300 lb (136.1 kg)  06/26/16 (!) 302 lb (137 kg)  06/20/16 299 lb 6.4 oz (135.8 kg)  06/14/16 292 lb 3.2 oz (132.5 kg)   Ideal Body Weight:  54.54 kg  BMI:  Body mass index is 51.15 kg/m.  Estimated Nutritional Needs:  Kcal:  1200-1365 (22-25 kcal/kg ibw) Protein:  >136 g Pro (2.5 g/kg bw) Fluid:  Per MD  EDUCATION NEEDS:  No education needs identified at this time  Burtis Junes RD, LDN, [place] Clinical Nutrition Pager: [phone] 09/08/2016 12:04 PM

## 2016-09-08 NOTE — ED Provider Notes (Signed)
TIME SEEN: 6:19 AM  CHIEF COMPLAINT: respiratory distress  HPI: Pt is a 56 y.o. female with history of COPD, CHF, chronic kidney disease ho presents to the emergency department with EMS for respiratory distress. Per patient's son, patient began feeling short of breath this morning and it quickly progressed and she was diaphoretic and speed in short sentences. She does were oxygen at home per EMS. Upon EMS arrival, oxygen saturation in the 70s. During transport patient became less responsive and had agonal respirations. They were assisting ventilation with BVM.  Patient was given 4 g of IV magnesium, 125 mg of IV Solu-Medrol, 7.5 mg of albuterol by EMS.  ROS: level V caveat for respiratory distress  PAST MEDICAL HISTORY/PAST SURGICAL HISTORY:  Past Medical History:  Diagnosis Date  . Anxiety   . Arthritis    "all over" (04/10/2016)  . Asthma   . CHF (congestive heart failure) ([place])   . Chronic bronchitis ([place])   . Chronic kidney disease    "I see a kidney dr." (04/10/2016)  . Cocaine abuse   . Complication of anesthesia    decreased bp, decreased heart rate  . Depression   . Disorder of nervous system   . Emphysema   . GERD (gastroesophageal reflux disease)   . Heart attack ([place]) 1980s  . History of blood transfusion 1994   "couldn't stop bleeding from my period"  . Hyperlipidemia LDL goal <70   . Hypertension   . Incontinence   . Manic depression ([place])   . On home oxygen therapy    "6L; 24/7" (04/10/2016)  . OSA on CPAP    "wear mask sometimes" (04/10/2016)  . Paranoid ([place])    "sometimes; I'm on RX for it" (04/10/2016)  . Pneumonia    "I've had it several times; haven't had it since 06/2015" (04/10/2016)  . Schizophrenia ([place])   . Seasonal allergies   . Seizures ([place])    "don't know what kind; last one was ~ 1 yr ago" (04/10/2016)  . Sinus trouble   . Stroke [facility]) 1980s   denies residual on 04/10/2016  . Type II diabetes mellitus ([facility])     MEDICATIONS:  Prior to Admission  medications   Medication Sig Start Date End Date Taking? Authorizing Provider  albuterol (PROVENTIL) (2.5 MG/3ML) 0.083% nebulizer solution Take 3 mLs (2.5 mg total) by nebulization every 6 (six) hours as needed for wheezing or shortness of breath. 06/13/16   Archie Patten, MD  Alcohol Swabs (B-D SINGLE USE SWABS REGULAR) PADS CHECK CAPILLARY BLOOD GLUCOSE ONE TIME DAILY 02/03/16   Archie Patten, MD  amLODipine (NORVASC) 5 MG tablet Take 1 tablet (5 mg total) by mouth daily. 06/13/16   Archie Patten, MD  aspirin 81 MG EC tablet Take 1 tablet (81 mg total) by mouth daily. Swallow whole. 06/13/16   Archie Patten, MD  atorvastatin (LIPITOR) 40 MG tablet Take 1 tablet (40 mg total) by mouth daily at bedtime 07/27/16   Mikell, Jeani Sow, MD  benztropine (COGENTIN) 0.5 MG tablet Take 1 tablet (0.5 mg total) by mouth daily. 07/20/16 07/20/17  Verner Mould, MD  Blood Glucose Monitoring Suppl (ACCU-CHEK AVIVA PLUS) w/Device KIT CHECK BLOOD SUGAR THREE TIMES DAILY 12/02/15   Archie Patten, MD  divalproex ([name] ER) 500 MG 24 hr tablet Take 2 tablets (1,000 mg total) by mouth at bedtime. Patient taking differently: Take 1,000 mg by mouth every morning.  06/29/16 06/29/17  Kathlee Nations, MD  escitalopram (LEXAPRO) 20 MG tablet Take 1 tablet (20 mg total) by mouth daily every morning 08/31/16   Arfeen, Arlyce Harman, MD  fluticasone furoate-vilanterol (BREO ELLIPTA) 100-25 MCG/INH AEPB INHALE 1 PUFF EVERY DAY 06/13/16   Archie Patten, MD  gabapentin (NEURONTIN) 600 MG tablet Take 1 tablet (600 mg total) by mouth 3 (three) times daily. 06/28/16   Archie Patten, MD  glucose blood test strip Use to check blood sugar 3 times daily. 12/05/15   Pucilowska, Herma Ard B, MD  metFORMIN (GLUCOPHAGE-XR) 500 MG 24 hr tablet Take 1 tablet (500 mg total) by mouth 2 (two) times daily. 06/28/16   Archie Patten, MD  mirtazapine (REMERON) 45 MG tablet Take 1 tablet (45 mg total) by mouth at bedtime.  06/29/16   Arfeen, Arlyce Harman, MD  montelukast (SINGULAIR) 10 MG tablet Take 1 tablet (10 mg total) by mouth at bedtime. 06/13/16   Archie Patten, MD  Multiple Vitamin (MULTIVITAMIN WITH MINERALS) TABS tablet Take 1 tablet by mouth daily.    [provider]  naproxen (NAPROXEN DR) 500 MG EC tablet Take 1 tablet (500 mg total) by mouth 2 (two) times daily with a meal. 09/07/16   Mikell, Jeani Sow, MD  omeprazole (PRILOSEC) 40 MG capsule Take 1 capsule (40 mg total) by mouth 2 (two) times daily. 08/23/16   Mikell, Jeani Sow, MD  OXYGEN Inhale 6 L into the lungs daily.    [provider]  potassium chloride SA (K-DUR,KLOR-CON) 20 MEQ tablet Take 2.5 tablets (50 mEq total) by mouth 2 (two) times daily. 07/04/16   Bhagat, Crista Luria, PA  tiotropium (SPIRIVA HANDIHALER) 18 MCG inhalation capsule Place 1 capsule (18 mcg total) into inhaler and inhale daily. 06/13/16   Archie Patten, MD  torsemide (DEMADEX) 20 MG tablet Take 2 tablets (40 mg total) by mouth 2 (two) times daily. 07/20/16 10/18/16  Verner Mould, MD  VENTOLIN HFA 108 [phone] Base) MCG/ACT inhaler Inhale 2 puffs into the lungs every 6 (six) hours as needed for wheezing or shortness of breath.  06/01/16   [provider]  ziprasidone (GEODON) 80 MG capsule Take 1 capsule (80 mg total) by mouth at bedtime. 08/31/16   Arfeen, Arlyce Harman, MD    ALLERGIES:  Allergies  Allergen Reactions  . Hydroxyzine Shortness Of Breath    -throat closed up  . Latuda [Lurasidone Hcl] Anaphylaxis  . Codeine Nausea And Vomiting  . Sulfa Antibiotics Itching    SOCIAL HISTORY:  Social History  Substance Use Topics  . Smoking status: Former Smoker    Packs/day: 1.50    Years: 38.00    Types: Cigarettes    Start date: 03/13/1977    Quit date: 04/10/2016  . Smokeless tobacco: Never Used  . Alcohol use No    FAMILY HISTORY: Family History  Problem Relation Age of Onset  . Cancer Father        prostate  . Cancer Mother         lung  . Depression Mother   . Depression Sister   . Anxiety disorder Sister   . Schizophrenia Sister   . Bipolar disorder Sister   . Depression Sister   . Depression Brother   . Heart failure Unknown        cousin    EXAM: BP (!) 170/50   Pulse (!) 116   Temp 97.8 F (36.6 C) (Temporal)   Resp 20  CONSTITUTIONAL: patient is obese, has agonal respirations, does  not answer questions or follow commands; chronically ill-appearing and morbidly obese, GCS 7 HEAD: Normocephalic EYES: Conjunctivae clear, pupils appear equal, EOMI ENT: normal nose; moist mucous membranes NECK: Supple, no meningismus, no nuchal rigidity, no LAD  CARD: regular and tachycardic, no murmur appreciated, no rubs or gallops, extremities seem to be warm and well-perfused; initially patient had a strong pulse but then quickly lost it secondary to respiratory distress RESP: patient with severe respiratory distress. [name] not speaking and has agonal, He respirations. She has severely diminished breath sounds bilaterally. Exam is limited as she is morbidly obese. ABD/GI: Normal bowel sounds; non-distended; soft BACK:  The back appears normal  EXT: no edema; normal capillary refill; no cyanosis, no calf tenderness or swelling    SKIN: Normal color for age and race; warm; no rash NEURO: patient initially breathing on her own with very shallow respirations but then became apneic, does not follow commands or answer questions, GCS initially 6 (for spontaneous eye opening)   MEDICAL DECISION MAKING: Patient here with severe respiratory distress and ultimately respiratory failure. I think this is a combination of CHF and COPD.  Her chest x-ray shows diffuse edema. No infiltrate. Rectal temp is 98.1.   Patient initially had a pulse and had very agonal, guppy, shallow respirations. Patient lost her pulse in the trauma bay and CPR was started.  Patient was given 3 rounds of epinephrine,2 rounds of bicarbonate and was  intubated. Shortly after intubation, patient had return of spontaneous circulation. Total CPR approximately 10 minutes.   EKG appears to be atrial flutter without signs of ischemia.  Blood glucose 200.  ED PROGRESS:  6:30 AM  Pt's blood gas shows respiratory acidosis with hypercapnia. Discussed with Dr. Emmit Alexanders with critical care. They will see the patient for admission. I have updated patient's son with chaplain present. He states patient is a full code. Patient currently sedated on propofol.  Patient to receive albuterol, Atrovent in the emergency department. We have given her Lasix 40 mg IV.  Blood pressure controlled at this time.   I reviewed all nursing notes, vitals, pertinent previous records, EKGs, lab and urine results, imaging (as available).     EKG Interpretation  Date/Time:  Friday September 08 2016 06:12:39 EDT Ventricular Rate:  123 PR Interval:    QRS Duration: 160 QT Interval:  337 QTC Calculation: 483 R Axis:   59 Text Interpretation:  Atrial flutter with predominant 2:1 AV block Multiple ventricular premature complexes Right bundle branch block Confirmed by Ward, Cyril Mourning [phone]) on 09/08/2016 6:20:10 AM       CRITICAL CARE Performed by: Nyra Jabs   Total critical care time: 45 minutes  Critical care time was exclusive of separately billable procedures and treating other patients.  Critical care was necessary to treat or prevent imminent or life-threatening deterioration.  Critical care was time spent personally by me on the following activities: development of treatment plan with patient and/or surrogate as well as nursing, discussions with consultants, evaluation of patient's response to treatment, examination of patient, obtaining history from patient or surrogate, ordering and performing treatments and interventions, ordering and review of laboratory studies, ordering and review of radiographic studies, pulse oximetry and re-evaluation of patient's  condition.      Cardiopulmonary Resuscitation (CPR) Procedure Note Directed/Performed by: Nyra Jabs I personally directed ancillary staff and/or performed CPR in an effort to regain return of spontaneous circulation and to maintain cardiac, neuro and systemic perfusion.     Procedure Name:  Intubation Date/Time: 09/08/2016 6:24 AM Performed by: Nyra Jabs Pre-anesthesia Checklist: Emergency Drugs available, Suction available and Patient being monitored Oxygen Delivery Method: Ambu bag Preoxygenation: Pre-oxygenation with 100% oxygen Induction Type: Rapid sequence Ventilation: Mask ventilation with difficulty and Two handed mask ventilation required Laryngoscope Size: Mac, 3 and Glidescope Grade View: Grade IV Tube size: 7.5 mm Number of attempts: 1 Airway Equipment and Method: Patient positioned with wedge pillow and Stylet Placement Confirmation: ETT inserted through vocal cords under direct vision,  Positive ETCO2 and Breath sounds checked- equal and bilateral Secured at: 25 cm Tube secured with: ETT holder Dental Injury: Teeth and Oropharynx as per pre-operative assessment  Difficulty Due To: Difficulty was anticipated, Difficult Airway- due to large tongue, Difficult Airway- due to reduced neck mobility and Difficult Airway- due to limited oral opening Future Recommendations: Recommend- induction with short-acting agent, and alternative techniques readily available          Ward, Delice Bison, DO 09/08/16 1610

## 2016-09-08 NOTE — ED Triage Notes (Addendum)
Patient arrived with EMS from home with sudden onset SOB this morning , history of CHF/COPD , received Albuterol 7.5/Atrovent 0.5 mg IV , Magnesium 2 mg IV and Solumedrol 125 mg . IV by EMS prior to arrival . CPR initiated at arrival - pt. pulseless /agonal respirations at arrival .

## 2016-09-08 NOTE — H&P (Signed)
PULMONARY / CRITICAL CARE MEDICINE   Name: Tiffany [name] MRN: [id number] DOB: [birth year]    ADMISSION DATE:  09/08/2016 CONSULTATION DATE:  8/24  REFERRING MD:  Dr. Leonides Schanz EDP  CHIEF COMPLAINT:  Cardiac arrest  HISTORY OF PRESENT ILLNESS:   56 year old female with PMH as below, which is significant for COPD on 6L O2, CHF, CKD, cocaine abuse, CAD, OSA on CPAP, and DM. Recently admitted in June for Right sided tib/fib fracture and underwent surgical repair. She was seen in the primary care office 8/23 and was felt to be volume overload and was recommended to restart her torsemide. 8/24 AM she awoke markedly dyspneic and her son checked her O2 sat, which was 60%. EMS was called. Upon arrival to ED she had agonal respirations and was found to be pulseless. CPR was initiated and continued for approximately 10-15 minutes PEA. She was placed on ventilator post arrest and PCCM was asked to evaluate. CXR consistent with pulmonary edema.   PAST MEDICAL HISTORY :  She  has a past medical history of Anxiety; Arthritis; Asthma; CHF (congestive heart failure) ([place]); Chronic bronchitis ([place]); Chronic kidney disease; Cocaine abuse; Complication of anesthesia; Depression; Disorder of nervous system; Emphysema; GERD (gastroesophageal reflux disease); Heart attack ([place]) (1980s); History of blood transfusion (1994); Hyperlipidemia LDL goal <70; Hypertension; Incontinence; Manic depression ([place]); On home oxygen therapy; OSA on CPAP; Paranoid ([place]); Pneumonia; Schizophrenia ([place]); Seasonal allergies; Seizures ([place]); Sinus trouble; Stroke [facility]) (1980s); and Type II diabetes mellitus ([place]).  PAST SURGICAL HISTORY: She  has a past surgical history that includes Cesarean section (1997); Hernia repair; Umbilical hernia repair (~ 1963); Vaginal hysterectomy; IR Perc Cholecystostomy (05/10/2016); IR Radiologist Eval & Mgmt (06/08/2016); IR Sinus/Fist Tube Chk-Non GI (07/12/2016); Tibia IM nail insertion (Right, 07/12/2016); and IR  CHOLANGIOGRAM EXISTING TUBE (07/20/2016).  Allergies  Allergen Reactions  . Hydroxyzine Shortness Of Breath    -throat closed up  . Latuda [Lurasidone Hcl] Anaphylaxis  . Codeine Nausea And Vomiting  . Sulfa Antibiotics Itching    No current facility-administered medications on file prior to encounter.    Current Outpatient Prescriptions on File Prior to Encounter  Medication Sig  . albuterol (PROVENTIL) (2.5 MG/3ML) 0.083% nebulizer solution Take 3 mLs (2.5 mg total) by nebulization every 6 (six) hours as needed for wheezing or shortness of breath.  . Alcohol Swabs (B-D SINGLE USE SWABS REGULAR) PADS CHECK CAPILLARY BLOOD GLUCOSE ONE TIME DAILY  . amLODipine (NORVASC) 5 MG tablet Take 1 tablet (5 mg total) by mouth daily.  [name] aspirin 81 MG EC tablet Take 1 tablet (81 mg total) by mouth daily. Swallow whole.  [name] atorvastatin (LIPITOR) 40 MG tablet Take 1 tablet (40 mg total) by mouth daily at bedtime  . benztropine (COGENTIN) 0.5 MG tablet Take 1 tablet (0.5 mg total) by mouth daily.  . Blood Glucose Monitoring Suppl (ACCU-CHEK AVIVA PLUS) w/Device KIT CHECK BLOOD SUGAR THREE TIMES DAILY  . divalproex ([name] ER) 500 MG 24 hr tablet Take 2 tablets (1,000 mg total) by mouth at bedtime. (Patient taking differently: Take 1,000 mg by mouth every morning. )  . escitalopram (LEXAPRO) 20 MG tablet Take 1 tablet (20 mg total) by mouth daily every morning  . fluticasone furoate-vilanterol (BREO ELLIPTA) 100-25 MCG/INH AEPB INHALE 1 PUFF EVERY DAY  . gabapentin (NEURONTIN) 600 MG tablet Take 1 tablet (600 mg total) by mouth 3 (three) times daily.  [name] glucose blood test strip Use to check blood sugar 3 times daily.  [name]  metFORMIN (GLUCOPHAGE-XR) 500 MG 24 hr tablet Take 1 tablet (500 mg total) by mouth 2 (two) times daily.  . mirtazapine (REMERON) 45 MG tablet Take 1 tablet (45 mg total) by mouth at bedtime.  . montelukast (SINGULAIR) 10 MG tablet Take 1 tablet (10 mg total) by mouth at bedtime.  .  Multiple Vitamin (MULTIVITAMIN WITH MINERALS) TABS tablet Take 1 tablet by mouth daily.  . naproxen (NAPROXEN DR) 500 MG EC tablet Take 1 tablet (500 mg total) by mouth 2 (two) times daily with a meal.  . omeprazole (PRILOSEC) 40 MG capsule Take 1 capsule (40 mg total) by mouth 2 (two) times daily.  . OXYGEN Inhale 6 L into the lungs daily.  . potassium chloride SA (K-DUR,KLOR-CON) 20 MEQ tablet Take 2.5 tablets (50 mEq total) by mouth 2 (two) times daily.  [name] tiotropium (SPIRIVA HANDIHALER) 18 MCG inhalation capsule Place 1 capsule (18 mcg total) into inhaler and inhale daily.  [name] torsemide (DEMADEX) 20 MG tablet Take 2 tablets (40 mg total) by mouth 2 (two) times daily.  . VENTOLIN HFA 108 (90 Base) MCG/ACT inhaler Inhale 2 puffs into the lungs every 6 (six) hours as needed for wheezing or shortness of breath.   . ziprasidone (GEODON) 80 MG capsule Take 1 capsule (80 mg total) by mouth at bedtime.    FAMILY HISTORY:  Her indicated that her mother is alive. She indicated that her father is alive. She indicated that all of her three sisters are alive. She indicated that all of her three brothers are alive. She indicated that the status of her unknown relative is unknown.    SOCIAL HISTORY: She  reports that she quit smoking about 4 months ago. Her smoking use included Cigarettes. She started smoking about 39 years ago. She has a 57.00 pack-year smoking history. She has never used smokeless tobacco. She reports that she does not drink alcohol or use drugs.  REVIEW OF SYSTEMS:   Unable as patient is encephalopathic and intubated  SUBJECTIVE:    VITAL SIGNS: BP (!) 157/88 (BP Location: Right Arm)   Pulse (!) 105   Temp 98.1 F (36.7 C) (Rectal)   Resp 20   Ht '5\' 4"'  (1.626 m)   Wt 135.2 kg (298 lb)   SpO2 95%   BMI 51.15 kg/m   HEMODYNAMICS:    VENTILATOR SETTINGS: Vent Mode: PRVC FiO2 (%):  [100 %] 100 % Set Rate:  [18 bmp] 18 bmp Vt Set:  [500 mL] 500 mL PEEP:  [5 cmH20] 5  cmH20 Plateau Pressure:  [35 cmH20] 35 cmH20  INTAKE / OUTPUT: No intake/output data recorded.  PHYSICAL EXAMINATION: General:  Adult female, no distress  Neuro:  Alert, following commands, moves extremities  HEENT:  ETT in place  Cardiovascular:  RRR, no MRG Lungs:  Diminished breath sounds, non-labored, no wheeze  Abdomen:  Obese, active bowel sounds  Musculoskeletal:  -edema  Skin:  Warm, dry, intact   LABS:  BMET No results for input(s): NA, K, CL, CO2, BUN, CREATININE, GLUCOSE in the last 168 hours.  Electrolytes No results for input(s): CALCIUM, MG, PHOS in the last 168 hours.  CBC No results for input(s): WBC, HGB, HCT, PLT in the last 168 hours.  Coag's No results for input(s): APTT, INR in the last 168 hours.  Sepsis Markers No results for input(s): LATICACIDVEN, PROCALCITON, O2SATVEN in the last 168 hours.  ABG  Recent Labs Lab 09/08/16 0652  PHART 7.191*  PCO2ART 80.2*  PO2ART 120.0*  Liver Enzymes No results for input(s): AST, ALT, ALKPHOS, BILITOT, ALBUMIN in the last 168 hours.  Cardiac Enzymes No results for input(s): TROPONINI, PROBNP in the last 168 hours.  Glucose  Recent Labs Lab 09/08/16 0602  GLUCAP 200*    Imaging Dg Chest Portable 1 View  Result Date: 09/08/2016 CLINICAL DATA:  Sudden onset shortness of breath. EXAM: PORTABLE CHEST 1 VIEW COMPARISON:  07/14/2016 FINDINGS: Endotracheal tube tip is 1 cm above the carina. An orogastric tube at least reaches the stomach. Extensive artifact from EKG wires and defibrillator pads. Cardiomegaly. Diffuse hazy density of the lungs, favor pulmonary edema. No effusion or pneumothorax. No visible rib fracture post CPR. IMPRESSION: 1. Endotracheal tube tip is 1 cm above the carina. An orogastric tube reaches the stomach at least. 2. Diffuse lung opacity favoring edema.  Chronic cardiomegaly. Electronically Signed   By: Monte Fantasia M.D.   On: 09/08/2016 06:33     STUDIES:  CXR 8/24 >  Diffuse hazy density of the lungs, favor pulmonary edema. No effusion or pneumothorax. No visible rib fracture post CPR  CULTURES: Blood 8/24 >> Tracheal Asp 8/24 >> U/A 8/24 >>   ANTIBIOTICS: Rocephin 8/24 >> Azithromycin 8/24 >>  SIGNIFICANT EVENTS: 8/24 > Presents to ED post-cardiac arrest   LINES/TUBES: ETT 8/24 >>  DISCUSSION: 56 year old female with extensive health history presents cardiac arrest after respiratory decompensation.   ASSESSMENT / PLAN:  PULMONARY A: Acute on Chronic hypoxic Respiratory failure in setting of CAP vs pulmonary edema  H/O COPD on 6L at baseline, OSA on CPAP  P:   Vent Support Wean as tolerated  Scheduled Duoneb, Pulmicort  Pulmonary Hygiene  Trend CXR and ABG   CARDIOVASCULAR A:  Cardiac Arrest 10-15 minutes before ROSC secondary to respiratory arrest  Acute on Chronic Diastolic HF (G2) H/O CAD, HTN  No indication for cooling as patient is awake and responsive  P:  Cardiac Monitoring  Maintain MAP > 65  Hold HTN medications  Trend troponin  ECHO pending   RENAL A:   CKD (Crt baseline 1.3-1.5) Volume Overload   P:   Trend BMP Replace electrolytes as indicated  Given 40 mcg lasix in ED (will redose pending on results)   GASTROINTESTINAL A:   GERD P:   NPO PPI  HEMATOLOGIC A:   No issues  P:  Trend CBC  SCDS Heparin SQ  INFECTIOUS A:   Leukocytosis ?CAP vs Pleural effusion   +/- Viral Source  P:   Trend WBC and Fever Curve  Follow culture data  Trend Lactic Acid and PCT  RVP pending  Antibiotics as above   ENDOCRINE A:   DM   P:   Trend Glucose SSI   NEUROLOGIC A:   H/O Polysubstance Abuse (Cocaine), Depression -UDS negative  P:   RASS goal: 0/-1 Fentanyl Gtt and PRN versed    FAMILY  - Updates: Family updated at bedside   - Inter-disciplinary family meet or Palliative Care meeting due by:  09/15/2016   CC Time: 57 minutes  Hayden Pedro, AGACNP-BC [place] [facility]  Pgr: [phone]  PCCM Pgr: [phone]  Attending Note  56 year old female with PMH of HTN and COPD presenting with respiratory failure and cardiac arrest.  Patient arrested in the ED and was intubated and had 3 rounds of epi.  Now is awake and interactive on exam, with diffuse crackles.  I reviewed CXR myself, pulmonary edema noted.  Likely this started with  flash pulmonary edema resulting in cardiac arrest.  Will maintain sedated and intubated for now.  Will diurese.  Add fentanyl for sedation while on the vent.  Continue full vent support.  F/U ABG and adjust vent accordingly.  CXR and ABG in AM.  The patient is critically ill with multiple organ systems failure and requires high complexity decision making for assessment and support, frequent evaluation and titration of therapies, application of advanced monitoring technologies and extensive interpretation of multiple databases.   Critical Care Time devoted to patient care services described in this note is  35  Minutes. This time reflects time of care of this signee Dr Jennet Maduro. This critical care time does not reflect procedure time, or teaching time or supervisory time of PA/NP/Med student/Med Resident etc but could involve care discussion time.  Rush Farmer, M.D. [facility] Pulmonary/Critical Care Medicine. Pager: [phone]. After hours pager: [phone].

## 2016-09-08 NOTE — Progress Notes (Signed)
Patient transported from Trauma B to 2M12.  Vital signs stable at this time. No complications. RN at bedside. RT will continue to monitor.

## 2016-09-08 NOTE — Assessment & Plan Note (Addendum)
Diastolic heart failure. Indicates some slight abdominal distention, no crackles, no edema noted. Patient continues to be non-compliant with take her toresmide. She denies any worsening SOB. Not requiring anymore oxygen. Still has not followed up with cardiology.  - Recommended patient restart toresmide as soon as possible - Patient plans restart as soon as she gets orthopedics allows her bare weight on her leg - Will obtain BMET to check potassium

## 2016-09-09 ENCOUNTER — Inpatient Hospital Stay (HOSPITAL_COMMUNITY): Payer: Medicare HMO

## 2016-09-09 DIAGNOSIS — J441 Chronic obstructive pulmonary disease with (acute) exacerbation: Principal | ICD-10-CM

## 2016-09-09 DIAGNOSIS — J810 Acute pulmonary edema: Secondary | ICD-10-CM

## 2016-09-09 LAB — BASIC METABOLIC PANEL
Anion gap: 10 (ref 5–15)
Anion gap: 10 (ref 5–15)
BUN: 31 mg/dL — ABNORMAL HIGH (ref 6–20)
BUN: 34 mg/dL — ABNORMAL HIGH (ref 6–20)
CO2: 31 mmol/L (ref 22–32)
CO2: 32 mmol/L (ref 22–32)
Calcium: 9 mg/dL (ref 8.9–10.3)
Calcium: 9 mg/dL (ref 8.9–10.3)
Chloride: 102 mmol/L (ref 101–111)
Chloride: 103 mmol/L (ref 101–111)
Creatinine, Ser: 1.72 mg/dL — ABNORMAL HIGH (ref 0.44–1.00)
Creatinine, Ser: 1.91 mg/dL — ABNORMAL HIGH (ref 0.44–1.00)
GFR calc Af Amer: 37 mL/min — ABNORMAL LOW (ref >60)
GFR calc Af Amer: 33 mL/min — ABNORMAL LOW (ref >60)
GFR calc non Af Amer: 32 mL/min — ABNORMAL LOW (ref >60)
GFR calc non Af Amer: 28 mL/min — ABNORMAL LOW (ref >60)
Glucose, Bld: 105 mg/dL — ABNORMAL HIGH (ref 65–99)
Glucose, Bld: 100 mg/dL — ABNORMAL HIGH (ref 65–99)
Potassium: 4.4 mmol/L (ref 3.5–5.1)
Potassium: 3.8 mmol/L (ref 3.5–5.1)
Sodium: 143 mmol/L (ref 135–145)
Sodium: 145 mmol/L (ref 135–145)

## 2016-09-09 LAB — VALPROIC ACID LEVEL: Valproic Acid Lvl: 20 ug/mL — ABNORMAL LOW (ref 50.0–100.0)

## 2016-09-09 LAB — CBC
HCT: 24.8 % — ABNORMAL LOW (ref 36.0–46.0)
Hemoglobin: 7.4 g/dL — ABNORMAL LOW (ref 12.0–15.0)
MCH: 27.7 pg (ref 26.0–34.0)
MCHC: 29.8 g/dL — ABNORMAL LOW (ref 30.0–36.0)
MCV: 92.9 fL (ref 78.0–100.0)
Platelets: 305 10*3/uL (ref 150–400)
RBC: 2.67 MIL/uL — ABNORMAL LOW (ref 3.87–5.11)
RDW: 16.1 % — ABNORMAL HIGH (ref 11.5–15.5)
WBC: 14.7 10*3/uL — ABNORMAL HIGH (ref 4.0–10.5)

## 2016-09-09 LAB — BLOOD GAS, ARTERIAL
Acid-Base Excess: 10.1 mmol/L — ABNORMAL HIGH (ref 0.0–2.0)
Bicarbonate: 34.4 mmol/L — ABNORMAL HIGH (ref 20.0–28.0)
Drawn by: 41875
FIO2: 90
MECHVT: 500 mL
O2 Saturation: 98.5 %
PEEP: 10 cmH2O
Patient temperature: 98.6
RATE: 26 resp/min
pCO2 arterial: 48.3 mmHg — ABNORMAL HIGH (ref 32.0–48.0)
pH, Arterial: 7.466 — ABNORMAL HIGH (ref 7.350–7.450)
pO2, Arterial: 128 mmHg — ABNORMAL HIGH (ref 83.0–108.0)

## 2016-09-09 LAB — GLUCOSE, CAPILLARY: Glucose-Capillary: 143 mg/dL — ABNORMAL HIGH (ref 65–99)

## 2016-09-09 LAB — PROCALCITONIN: Procalcitonin: 2.27 ng/mL

## 2016-09-09 LAB — LACTIC ACID, PLASMA
Lactic Acid, Venous: 1.5 mmol/L (ref 0.5–1.9)
Lactic Acid, Venous: 2.6 mmol/L (ref 0.5–1.9)

## 2016-09-09 LAB — MAGNESIUM: Magnesium: 2.2 mg/dL (ref 1.7–2.4)

## 2016-09-09 LAB — PHOSPHORUS: Phosphorus: 2 mg/dL — ABNORMAL LOW (ref 2.5–4.6)

## 2016-09-09 MED ORDER — SODIUM PHOSPHATES 45 MMOLE/15ML IV SOLN
20.0000 mmol | Freq: Once | INTRAVENOUS | Status: AC
  Administered 2016-09-09: 20 mmol via INTRAVENOUS
  Filled 2016-09-09: qty 6.67

## 2016-09-09 MED ORDER — FUROSEMIDE 10 MG/ML IJ SOLN
40.0000 mg | Freq: Four times a day (QID) | INTRAMUSCULAR | Status: DC
  Administered 2016-09-09 – 2016-09-10 (×4): 40 mg via INTRAVENOUS
  Filled 2016-09-09 (×4): qty 4

## 2016-09-09 MED ORDER — DIVALPROEX SODIUM 125 MG PO CSDR
250.0000 mg | DELAYED_RELEASE_CAPSULE | Freq: Two times a day (BID) | ORAL | Status: DC
  Administered 2016-09-09: 250 mg via ORAL
  Filled 2016-09-09 (×2): qty 2

## 2016-09-09 MED ORDER — ESCITALOPRAM OXALATE 20 MG PO TABS
20.0000 mg | ORAL_TABLET | Freq: Every day | ORAL | Status: DC
  Administered 2016-09-09 – 2016-09-12 (×4): 20 mg via ORAL
  Filled 2016-09-09 (×5): qty 1

## 2016-09-09 MED ORDER — MIRTAZAPINE 30 MG PO TABS
30.0000 mg | ORAL_TABLET | Freq: Every day | ORAL | Status: DC
  Administered 2016-09-09 – 2016-09-12 (×4): 30 mg via ORAL
  Filled 2016-09-09 (×5): qty 1

## 2016-09-09 MED ORDER — VALPROATE SODIUM 250 MG/5ML PO SOLN
250.0000 mg | Freq: Two times a day (BID) | ORAL | Status: DC
  Administered 2016-09-09 – 2016-09-14 (×11): 250 mg
  Filled 2016-09-09 (×12): qty 5

## 2016-09-09 NOTE — Progress Notes (Signed)
PULMONARY / CRITICAL CARE MEDICINE   Name: Tiffany [name] MRN: [id number] DOB: [birth year]    ADMISSION DATE:  09/08/2016 CONSULTATION DATE:  8/24  REFERRING MD:  Dr. Leonides Schanz EDP  CHIEF COMPLAINT:  Cardiac arrest  HISTORY OF PRESENT ILLNESS:   56 year old female with PMH as below, which is significant for COPD on 6L O2, CHF, CKD, cocaine abuse, CAD, OSA on CPAP, and DM.   8/24 AM she awoke markedly dyspneic and her son checked her O2 sat, which was 60%. EMS was called. Upon arrival to ED she had agonal respirations and was found to be pulseless. CPR was initiated and continued for approximately 10-15 minutes PEA. She was placed on ventilator post arrest and PCCM was asked to evaluate. CXR consistent with pulmonary edema.   SUBJECTIVE:  No events overnight. Remains on Fentanyl gtt.   VITAL SIGNS: BP (!) 108/57   Pulse 78   Temp (!) 100.6 F (38.1 C)   Resp (!) 26   Ht 5\' 4"  (1.626 m)   Wt (!) 140.1 kg (308 lb 13.8 oz)   SpO2 98%   BMI 53.02 kg/m   HEMODYNAMICS:    VENTILATOR SETTINGS: Vent Mode: PRVC FiO2 (%):  [70 %-100 %] 70 % Set Rate:  [26 bmp] 26 bmp Vt Set:  [500 mL] 500 mL PEEP:  [10 cmH20] 10 cmH20 Plateau Pressure:  [24 cmH20-32 cmH20] 32 cmH20  INTAKE / OUTPUT: I/O last 3 completed shifts: In: 1690.2 [I.V.:1003.5; NG/GT:636.8; IV Piggyback:50] Out: 1885 [TDDUK:0254]  PHYSICAL EXAMINATION: General:  Adult female, no distress, agitated  Neuro:  Alert, following commands, moves extremities  HEENT:  ETT in place  Cardiovascular:  RRR, no MRG Lungs:  Diminished breath sounds, crackles to bases, non-labored, no wheeze  Abdomen:  Obese, active bowel sounds  Musculoskeletal: +1 edema to BLE  Skin:  Warm, dry, intact   LABS:  BMET  Recent Labs Lab 09/08/16 0617 09/08/16 1537 09/09/16 0334  NA 145 143 143  K 3.4* 4.3 4.4  CL 100* 101 102  CO2 28 29 31   BUN 12 19 31*  CREATININE 1.38* 1.66* 1.72*  GLUCOSE 276* 161* 105*   Electrolytes  Recent  Labs Lab 09/08/16 0617 09/08/16 1537 09/09/16 0334  CALCIUM 8.7* 8.8* 9.0  MG  --   --  2.2  PHOS  --   --  2.0*   CBC  Recent Labs Lab 09/08/16 0617 09/09/16 0334  WBC 20.3* 14.7*  HGB 8.4* 7.4*  HCT 30.8* 24.8*  PLT 364 305   Coag's  Recent Labs Lab 09/08/16 0623  INR 1.15   Sepsis Markers  Recent Labs Lab 09/08/16 0725 09/08/16 0948 09/08/16 1248 09/09/16 0334  LATICACIDVEN  --  5.0* 6.3*  --   PROCALCITON <0.10  --   --  2.27   ABG  Recent Labs Lab 09/08/16 0652 09/08/16 0945 09/09/16 0420  PHART 7.191* 7.339* 7.466*  PCO2ART 80.2* 62.2* 48.3*  PO2ART 120.0* 106 128*   Liver Enzymes  Recent Labs Lab 09/08/16 0617  AST 42*  ALT 17  ALKPHOS 128*  BILITOT 0.4  ALBUMIN 2.8*   Cardiac Enzymes No results for input(s): TROPONINI, PROBNP in the last 168 hours.  Glucose  Recent Labs Lab 09/08/16 0602 09/08/16 0922 09/08/16 1200 09/08/16 1555  GLUCAP 200* 123* 112* 143*   Imaging No results found.  STUDIES:  CXR 8/24 > Diffuse hazy density of the lungs, favor pulmonary edema. No effusion or pneumothorax. No visible rib fracture  post CPR ECHO 8/24 > EF 60-65, G1DD  CULTURES: MRSA PCR 8/24 > negative  Blood 8/24 >> Tracheal Asp 8/24 >> U/A 8/24 > Rare Bacteria  ANTIBIOTICS: Rocephin 8/24 >> Azithromycin 8/24 >>  SIGNIFICANT EVENTS: 8/24 > Presents to ED post-cardiac arrest   LINES/TUBES: ETT 8/24 >>  DISCUSSION: 56 year old female with extensive health history presents cardiac arrest after respiratory decompensation.   ASSESSMENT / PLAN:  PULMONARY A: Acute on Chronic hypoxic Respiratory failure in setting of CAP vs pulmonary edema  H/O COPD on 6L at baseline, OSA on CPAP  P:   Vent Support Wean as tolerated  Scheduled Duoneb, Pulmicort  Pulmonary Hygiene  Trend CXR and ABG   CARDIOVASCULAR A:  Cardiac Arrest 10-15 minutes before ROSC secondary to respiratory arrest  Acute on Chronic Diastolic HF (G2) (EF  90-21)  H/O CAD, HTN  No indication for cooling as patient is awake and responsive  P:  Cardiac Monitoring  Maintain MAP > 65 (Remains off pressors)  Hold HTN medications   RENAL A:   Lactic Acidosis secondary to hypoxia/hypoperfusion vs sepsis   5.0 > 6.3 >>  Acute on Chronic KD (Crt baseline 1.3-1.5) Volume Overload   P:   Trend BMP Replace electrolytes as indicated  Trend Lactic Acid  Lasix 40 q6h   GASTROINTESTINAL A:   GERD P:   NPO PPI Continue TF   HEMATOLOGIC A:   No issues  P:  Trend CBC  SCDS Heparin SQ  INFECTIOUS A:   Leukocytosis ?CAP vs Pleural effusion   -RVP negative  -PCT 0.10 > 2.27  P:   Trend WBC and Fever Curve  Follow culture data  Trend Lactic Acid and PCT  Antibiotics as above   ENDOCRINE A:   DM   P:   Trend Glucose SSI   NEUROLOGIC A:   H/O Polysubstance Abuse (Cocaine), Depression -UDS negative  P:   RASS goal: 0/-1 Fentanyl Gtt and PRN versed  Restart home Lexapro, [name] at 250 mg BID, remeron    FAMILY  - Updates: Family updated at bedside   - Inter-disciplinary family meet or Palliative Care meeting due by:  09/15/2016   CC Time: 34 minutes  Hayden Pedro, AGACNP-BC [place] [facility]  Pgr: [phone]  PCCM Pgr: [phone]  Attending Note:  56 year old female with COPD and cocaine abuse who presents to the hospital in respiratory failure.  Patient was intubated and was very agitated this AM on exam with clear lungs.  I reviewed CXR myself, pulmonary edema.  Will maintain on full vent support.  Continue active diureses.  F/u on labs.  Replace electrolytes.  Continue TF.  Will monitor for now.  The patient is critically ill with multiple organ systems failure and requires high complexity decision making for assessment and support, frequent evaluation and titration of therapies, application of advanced monitoring technologies and extensive interpretation of multiple databases.    Critical Care Time devoted to patient care services described in this note is  35  Minutes. This time reflects time of care of this signee Dr Jennet Maduro. This critical care time does not reflect procedure time, or teaching time or supervisory time of PA/NP/Med student/Med Resident etc but could involve care discussion time.  Rush Farmer, M.D. [facility] Pulmonary/Critical Care Medicine. Pager: [phone]. After hours pager: [phone].

## 2016-09-10 ENCOUNTER — Inpatient Hospital Stay (HOSPITAL_COMMUNITY): Payer: Medicare HMO

## 2016-09-10 LAB — BLOOD GAS, ARTERIAL
Acid-Base Excess: 9.1 mmol/L — ABNORMAL HIGH (ref 0.0–2.0)
Bicarbonate: 32.6 mmol/L — ABNORMAL HIGH (ref 20.0–28.0)
Drawn by: 236041
FIO2: 60
MECHVT: 500 mL
O2 Saturation: 96.4 %
PEEP: 10 cmH2O
Patient temperature: 98.6
RATE: 26 resp/min
pCO2 arterial: 40.1 mmHg (ref 32.0–48.0)
pH, Arterial: 7.521 — ABNORMAL HIGH (ref 7.350–7.450)
pO2, Arterial: 81.1 mmHg — ABNORMAL LOW (ref 83.0–108.0)

## 2016-09-10 LAB — CBC
HCT: 24.2 % — ABNORMAL LOW (ref 36.0–46.0)
Hemoglobin: 7.1 g/dL — ABNORMAL LOW (ref 12.0–15.0)
MCH: 27.8 pg (ref 26.0–34.0)
MCHC: 29.3 g/dL — ABNORMAL LOW (ref 30.0–36.0)
MCV: 94.9 fL (ref 78.0–100.0)
Platelets: 323 10*3/uL (ref 150–400)
RBC: 2.55 MIL/uL — ABNORMAL LOW (ref 3.87–5.11)
RDW: 17.5 % — ABNORMAL HIGH (ref 11.5–15.5)
WBC: 11.9 10*3/uL — ABNORMAL HIGH (ref 4.0–10.5)

## 2016-09-10 LAB — BASIC METABOLIC PANEL
Anion gap: 11 (ref 5–15)
BUN: 39 mg/dL — ABNORMAL HIGH (ref 6–20)
CO2: 30 mmol/L (ref 22–32)
Calcium: 8.9 mg/dL (ref 8.9–10.3)
Chloride: 104 mmol/L (ref 101–111)
Creatinine, Ser: 1.72 mg/dL — ABNORMAL HIGH (ref 0.44–1.00)
GFR calc Af Amer: 37 mL/min — ABNORMAL LOW (ref >60)
GFR calc non Af Amer: 32 mL/min — ABNORMAL LOW (ref >60)
Glucose, Bld: 102 mg/dL — ABNORMAL HIGH (ref 65–99)
Potassium: 3.2 mmol/L — ABNORMAL LOW (ref 3.5–5.1)
Sodium: 145 mmol/L (ref 135–145)

## 2016-09-10 LAB — CULTURE, RESPIRATORY W GRAM STAIN: Culture: NORMAL

## 2016-09-10 LAB — MAGNESIUM: Magnesium: 2.2 mg/dL (ref 1.7–2.4)

## 2016-09-10 LAB — PROCALCITONIN: Procalcitonin: 1.94 ng/mL

## 2016-09-10 LAB — PHOSPHORUS: Phosphorus: 3.9 mg/dL (ref 2.5–4.6)

## 2016-09-10 MED ORDER — BENZTROPINE MESYLATE 0.5 MG PO TABS
0.5000 mg | ORAL_TABLET | Freq: Every day | ORAL | Status: DC
  Administered 2016-09-10 – 2016-09-12 (×3): 0.5 mg via ORAL
  Filled 2016-09-10 (×4): qty 1

## 2016-09-10 MED ORDER — POTASSIUM CHLORIDE 20 MEQ/15ML (10%) PO SOLN
40.0000 meq | ORAL | Status: DC
  Administered 2016-09-10: 40 meq
  Filled 2016-09-10 (×2): qty 30

## 2016-09-10 MED ORDER — POTASSIUM CHLORIDE 20 MEQ/15ML (10%) PO SOLN
40.0000 meq | Freq: Three times a day (TID) | ORAL | Status: AC
  Administered 2016-09-10 (×2): 40 meq
  Filled 2016-09-10 (×2): qty 30

## 2016-09-10 MED ORDER — FUROSEMIDE 10 MG/ML IJ SOLN
40.0000 mg | Freq: Four times a day (QID) | INTRAMUSCULAR | Status: AC
  Administered 2016-09-10 (×2): 40 mg via INTRAVENOUS
  Filled 2016-09-10 (×2): qty 4

## 2016-09-10 NOTE — Progress Notes (Signed)
PULMONARY / CRITICAL CARE MEDICINE   Name: Tiffany [name] MRN: [id number] DOB: [birth year]    ADMISSION DATE:  09/08/2016 CONSULTATION DATE:  8/24  REFERRING MD:  Dr. Leonides Schanz EDP  CHIEF COMPLAINT:  Cardiac arrest  HISTORY OF PRESENT ILLNESS:   56 year old female with PMH as below, which is significant for COPD on 6L O2, CHF, CKD, cocaine abuse, CAD, OSA on CPAP, and DM.   8/24 AM she awoke markedly dyspneic and her son checked her O2 sat, which was 60%. EMS was called. Upon arrival to ED she had agonal respirations and was found to be pulseless. CPR was initiated and continued for approximately 10-15 minutes PEA. She was placed on ventilator post arrest and PCCM was asked to evaluate. CXR consistent with pulmonary edema.   SUBJECTIVE:  No events overnight, much more alert and interactive this AM, diuresed well overnight  VITAL SIGNS: BP 137/86   Pulse 96   Temp (!) 100.6 F (38.1 C)   Resp (!) 36   Ht 5\' 4"  (1.626 m)   Wt (!) 136.3 kg (300 lb 7.8 oz)   SpO2 99%   BMI 51.58 kg/m   HEMODYNAMICS:    VENTILATOR SETTINGS: Vent Mode: PRVC FiO2 (%):  [40 %-60 %] 40 % Set Rate:  [26 bmp] 26 bmp Vt Set:  [500 mL] 500 mL PEEP:  [8 cmH20-10 cmH20] 8 cmH20 Plateau Pressure:  [23 cmH20-35 cmH20] 24 cmH20  INTAKE / OUTPUT: I/O last 3 completed shifts: In: 3760 [I.V.:1700; NG/GT:1260; IV Piggyback:800] Out: 5065 [Urine:5065]  PHYSICAL EXAMINATION: General:  Adult female, no distress, agitated at times Neuro:  Awake and following commands, moving all ext HEENT:  [place]/AT, PERRL, EOM-I and MMM Cardiovascular:  RRR, Nl S1/S2, -M/R/G. Lungs:  CTA bilaterally Abdomen:  Soft, NT, ND and +BS Musculoskeletal: +1 edema to BLE  Skin:  Warm, dry, intact   LABS:  BMET  Recent Labs Lab 09/09/16 0334 09/09/16 1439 09/10/16 0303  NA 143 145 145  K 4.4 3.8 3.2*  CL 102 103 104  CO2 31 32 30  BUN 31* 34* 39*  CREATININE 1.72* 1.91* 1.72*  GLUCOSE 105* 100* 102*    Electrolytes  Recent Labs Lab 09/09/16 0334 09/09/16 1439 09/10/16 0303  CALCIUM 9.0 9.0 8.9  MG 2.2  --  2.2  PHOS 2.0*  --  3.9   CBC  Recent Labs Lab 09/08/16 0617 09/09/16 0334 09/10/16 0303  WBC 20.3* 14.7* 11.9*  HGB 8.4* 7.4* 7.1*  HCT 30.8* 24.8* 24.2*  PLT 364 305 323   Coag's  Recent Labs Lab 09/08/16 0623  INR 1.15   Sepsis Markers  Recent Labs Lab 09/08/16 0725  09/08/16 1248 09/09/16 0334 09/09/16 1141 09/09/16 1439 09/10/16 0303  LATICACIDVEN  --   < > 6.3*  --  1.5 2.6*  --   PROCALCITON <0.10  --   --  2.27  --   --  1.94  < > = values in this interval not displayed. ABG  Recent Labs Lab 09/08/16 0945 09/09/16 0420 09/10/16 0340  PHART 7.339* 7.466* 7.521*  PCO2ART 62.2* 48.3* 40.1  PO2ART 106 128* 81.1*   Liver Enzymes  Recent Labs Lab 09/08/16 0617  AST 42*  ALT 17  ALKPHOS 128*  BILITOT 0.4  ALBUMIN 2.8*   Cardiac Enzymes No results for input(s): TROPONINI, PROBNP in the last 168 hours.  Glucose  Recent Labs Lab 09/08/16 0602 09/08/16 0922 09/08/16 1200 09/08/16 1555  GLUCAP 200* 123* 112*  143*   Imaging Dg Chest Port 1 View  Result Date: 09/10/2016 CLINICAL DATA:  ET tube.  Cocaine abuse.  Congestive heart failure. EXAM: PORTABLE CHEST 1 VIEW COMPARISON:  09/09/2016. FINDINGS: Endotracheal tube has been withdrawn and now lies in good position 3.9 cm above the carina. Enteric tube lies in the stomach. The heart is enlarged. There are diffuse opacities in both lung fields, increasing from yesterday's radiograph, suggesting congestive heart failure. No effusion or pneumothorax. IMPRESSION: Support tubes and lines are in satisfactory position. Increasing pulmonary opacities suggesting congestive heart failure. Electronically Signed   By: Staci Righter M.D.   On: 09/10/2016 07:18   STUDIES:  CXR 8/24 > Diffuse hazy density of the lungs, favor pulmonary edema. No effusion or pneumothorax. No visible rib fracture  post CPR ECHO 8/24 > EF 60-65, G1DD  CULTURES: MRSA PCR 8/24 > negative  Blood 8/24 >>NTD Tracheal Asp 8/24 >>NTD U/A 8/24 > Rare Bacteria  ANTIBIOTICS: Rocephin 8/24 >> Azithromycin 8/24 >>  SIGNIFICANT EVENTS: 8/24 > Presents to ED post-cardiac arrest   LINES/TUBES: ETT 8/24 >>  DISCUSSION: 56 year old female with extensive health history presents cardiac arrest after respiratory decompensation.   ASSESSMENT / PLAN:  PULMONARY A: Acute on Chronic hypoxic Respiratory failure in setting of CAP vs pulmonary edema  H/O COPD on 6L at baseline, OSA on CPAP  P:   Decrease PEEP to 5 and FiO2 40% PS trials but no extubate Scheduled Duoneb, Pulmicort  Pulmonary Hygiene  Trend CXR and ABG   CARDIOVASCULAR A:  Cardiac Arrest 10-15 minutes before ROSC secondary to respiratory arrest  Acute on Chronic Diastolic HF (G2) (EF 54-65)  H/O CAD, HTN  No indication for cooling as patient is awake and responsive  P:  Cardiac Monitoring  Maintain MAP > 65 Hold HTN medications   RENAL A:   Lactic Acidosis secondary to hypoxia/hypoperfusion vs sepsis   5.0 > 6.3 >>  Acute on Chronic KD (Crt baseline 1.3-1.5) Volume Overload   P:   Trend BMP Replace electrolytes as indicated  Lasix 40 q6h x3 doses  GASTROINTESTINAL A:   GERD P:   NPO PPI Continue TF   HEMATOLOGIC A:   No issues  P:  Trend CBC  SCDS Heparin SQ  INFECTIOUS A:   Leukocytosis ?CAP vs Pleural effusion   RVP negative  PCT 0.10 > 2.27   P:   Trend WBC and Fever Curve  Follow culture data  Antibiotics as above   ENDOCRINE A:   DM   P:   Trend Glucose SSI   NEUROLOGIC A:   H/O Polysubstance Abuse (Cocaine), Depression -UDS negative  P:   RASS goal: 0/-1 Fentanyl Gtt and PRN versed  Restart home Lexapro, [name] at 250 mg BID, remeron   FAMILY  - Updates: No family to update bedside  - Inter-disciplinary family meet or Palliative Care meeting due by:  09/15/2016  The patient is  critically ill with multiple organ systems failure and requires high complexity decision making for assessment and support, frequent evaluation and titration of therapies, application of advanced monitoring technologies and extensive interpretation of multiple databases.   Critical Care Time devoted to patient care services described in this note is  35  Minutes. This time reflects time of care of this signee Dr Jennet Maduro. This critical care time does not reflect procedure time, or teaching time or supervisory time of PA/NP/Med student/Med Resident etc but could involve care discussion time.  Rush Farmer,  M.D. [facility] Pulmonary/Critical Care Medicine. Pager: [phone]. After hours pager: [phone].

## 2016-09-10 NOTE — Progress Notes (Signed)
[facility] Progress Note Patient Name: Tiffany [name] DOB: May 15, 1960 MRN: [id number]   Date of Service  09/10/2016  HPI/Events of Note  hypokalemia  eICU Interventions  Potassium replaced     Intervention Category Intermediate Interventions: Electrolyte abnormality - evaluation and management  DETERDING,ELIZABETH 09/10/2016, 5:17 AM

## 2016-09-10 NOTE — Progress Notes (Addendum)
Pt CBGs are not crossing over into EPIC automatically.Pt CBG today @1235  was 132, and @0902  107. RN is aware of this issue. I have printed new armband to see if this rectifies the problem. Will send POC a message about the issue.

## 2016-09-11 ENCOUNTER — Ambulatory Visit (HOSPITAL_COMMUNITY): Payer: Medicare HMO | Admitting: Psychiatry

## 2016-09-11 ENCOUNTER — Inpatient Hospital Stay (HOSPITAL_COMMUNITY): Payer: Medicare HMO

## 2016-09-11 DIAGNOSIS — J9602 Acute respiratory failure with hypercapnia: Secondary | ICD-10-CM

## 2016-09-11 DIAGNOSIS — N179 Acute kidney failure, unspecified: Secondary | ICD-10-CM

## 2016-09-11 LAB — GLUCOSE, CAPILLARY
Glucose-Capillary: 100 mg/dL — ABNORMAL HIGH (ref 65–99)
Glucose-Capillary: 100 mg/dL — ABNORMAL HIGH (ref 65–99)
Glucose-Capillary: 100 mg/dL — ABNORMAL HIGH (ref 65–99)
Glucose-Capillary: 102 mg/dL — ABNORMAL HIGH (ref 65–99)
Glucose-Capillary: 102 mg/dL — ABNORMAL HIGH (ref 65–99)
Glucose-Capillary: 105 mg/dL — ABNORMAL HIGH (ref 65–99)
Glucose-Capillary: 107 mg/dL — ABNORMAL HIGH (ref 65–99)
Glucose-Capillary: 107 mg/dL — ABNORMAL HIGH (ref 65–99)
Glucose-Capillary: 109 mg/dL — ABNORMAL HIGH (ref 65–99)
Glucose-Capillary: 110 mg/dL — ABNORMAL HIGH (ref 65–99)
Glucose-Capillary: 113 mg/dL — ABNORMAL HIGH (ref 65–99)
Glucose-Capillary: 114 mg/dL — ABNORMAL HIGH (ref 65–99)
Glucose-Capillary: 116 mg/dL — ABNORMAL HIGH (ref 65–99)
Glucose-Capillary: 123 mg/dL — ABNORMAL HIGH (ref 65–99)
Glucose-Capillary: 132 mg/dL — ABNORMAL HIGH (ref 65–99)
Glucose-Capillary: 97 mg/dL (ref 65–99)
Glucose-Capillary: 108 mg/dL — ABNORMAL HIGH (ref 65–99)
Glucose-Capillary: 124 mg/dL — ABNORMAL HIGH (ref 65–99)
Glucose-Capillary: 123 mg/dL — ABNORMAL HIGH (ref 65–99)

## 2016-09-11 LAB — CBC
HCT: 25.9 % — ABNORMAL LOW (ref 36.0–46.0)
Hemoglobin: 7.6 g/dL — ABNORMAL LOW (ref 12.0–15.0)
MCH: 28.1 pg (ref 26.0–34.0)
MCHC: 29.3 g/dL — ABNORMAL LOW (ref 30.0–36.0)
MCV: 95.9 fL (ref 78.0–100.0)
Platelets: 323 10*3/uL (ref 150–400)
RBC: 2.7 MIL/uL — ABNORMAL LOW (ref 3.87–5.11)
RDW: 17.5 % — ABNORMAL HIGH (ref 11.5–15.5)
WBC: 12 10*3/uL — ABNORMAL HIGH (ref 4.0–10.5)

## 2016-09-11 LAB — BASIC METABOLIC PANEL
Anion gap: 11 (ref 5–15)
BUN: 46 mg/dL — ABNORMAL HIGH (ref 6–20)
CO2: 26 mmol/L (ref 22–32)
Calcium: 9.1 mg/dL (ref 8.9–10.3)
Chloride: 110 mmol/L (ref 101–111)
Creatinine, Ser: 2.13 mg/dL — ABNORMAL HIGH (ref 0.44–1.00)
GFR calc Af Amer: 28 mL/min — ABNORMAL LOW (ref >60)
GFR calc non Af Amer: 25 mL/min — ABNORMAL LOW (ref >60)
Glucose, Bld: 110 mg/dL — ABNORMAL HIGH (ref 65–99)
Potassium: 4 mmol/L (ref 3.5–5.1)
Sodium: 147 mmol/L — ABNORMAL HIGH (ref 135–145)

## 2016-09-11 LAB — PHOSPHORUS: Phosphorus: 4.7 mg/dL — ABNORMAL HIGH (ref 2.5–4.6)

## 2016-09-11 LAB — MAGNESIUM: Magnesium: 2.4 mg/dL (ref 1.7–2.4)

## 2016-09-11 MED ORDER — FREE WATER
250.0000 mL | Freq: Four times a day (QID) | Status: DC
  Administered 2016-09-11 – 2016-09-14 (×12): 250 mL

## 2016-09-11 MED ORDER — DEXTROSE 5 % IV SOLN
2.0000 g | INTRAVENOUS | Status: DC
  Administered 2016-09-11 – 2016-09-12 (×2): 2 g via INTRAVENOUS
  Filled 2016-09-11 (×2): qty 2

## 2016-09-11 MED ORDER — METOLAZONE 5 MG PO TABS
5.0000 mg | ORAL_TABLET | Freq: Every day | ORAL | Status: AC
  Administered 2016-09-11: 5 mg via ORAL
  Filled 2016-09-11: qty 1

## 2016-09-11 MED ORDER — FUROSEMIDE 10 MG/ML IJ SOLN
40.0000 mg | Freq: Four times a day (QID) | INTRAMUSCULAR | Status: AC
  Administered 2016-09-11 (×3): 40 mg via INTRAVENOUS
  Filled 2016-09-11 (×3): qty 4

## 2016-09-11 NOTE — Progress Notes (Signed)
PULMONARY / CRITICAL CARE MEDICINE   Name: Tiffany [name] MRN: [id number] DOB: [birth year]    ADMISSION DATE:  09/08/2016 CONSULTATION DATE:  8/24  REFERRING MD:  Dr. Leonides Schanz EDP  CHIEF COMPLAINT:  Cardiac arrest  HISTORY OF PRESENT ILLNESS:   56 year old female with PMH as below, which is significant for COPD on 6L O2, CHF, CKD, cocaine abuse, CAD, OSA on CPAP, and DM.   8/24 AM she awoke markedly dyspneic and her son checked her O2 sat, which was 60%. EMS was called. Upon arrival to ED she had agonal respirations and was found to be pulseless. CPR was initiated and continued for approximately 10-15 minutes PEA. She was placed on ventilator post arrest and PCCM was asked to evaluate. CXR consistent with pulmonary edema.   SUBJECTIVE:  Much more agitated this AM, diuresed well overnight  VITAL SIGNS: BP (!) 104/53   Pulse 82   Temp (!) 100.4 F (38 C)   Resp (!) 21   Ht 5\' 4"  (1.626 m)   Wt 135.8 kg (299 lb 6.2 oz)   SpO2 95%   BMI 51.39 kg/m   HEMODYNAMICS:    VENTILATOR SETTINGS: Vent Mode: PRVC FiO2 (%):  [40 %] 40 % Set Rate:  [26 bmp] 26 bmp Vt Set:  [500 mL] 500 mL PEEP:  [5 cmH20-8 cmH20] 5 cmH20 Pressure Support:  [18 cmH20] 18 cmH20 Plateau Pressure:  [20 cmH20-25 cmH20] 20 cmH20  INTAKE / OUTPUT: I/O last 3 completed shifts: In: 9937 [I.V.:1630; NG/GT:1515; IV JIRCVELFY:101] Out: 7510 [Urine:9330]  PHYSICAL EXAMINATION: General:  Adult female, no distress, agitated at times Neuro:  Awake and following commands, moving all ext HEENT:  [place]/AT, PERRL, EOM-I and MMM Cardiovascular:  RRR, Nl S1/S2, -M/R/G. Lungs:  CTA bilaterally Abdomen:  Soft, NT, ND and +BS Musculoskeletal: +1 edema to BLE  Skin:  Warm, dry, intact   LABS:  BMET  Recent Labs Lab 09/09/16 1439 09/10/16 0303 09/11/16 0505  NA 145 145 147*  K 3.8 3.2* 4.0  CL 103 104 110  CO2 32 30 26  BUN 34* 39* 46*  CREATININE 1.91* 1.72* 2.13*  GLUCOSE 100* 102* 110*    Electrolytes  Recent Labs Lab 09/09/16 0334 09/09/16 1439 09/10/16 0303 09/11/16 0505  CALCIUM 9.0 9.0 8.9 9.1  MG 2.2  --  2.2 2.4  PHOS 2.0*  --  3.9 4.7*   CBC  Recent Labs Lab 09/09/16 0334 09/10/16 0303 09/11/16 0505  WBC 14.7* 11.9* 12.0*  HGB 7.4* 7.1* 7.6*  HCT 24.8* 24.2* 25.9*  PLT 305 323 323   Coag's  Recent Labs Lab 09/08/16 0623  INR 1.15   Sepsis Markers  Recent Labs Lab 09/08/16 0725  09/08/16 1248 09/09/16 0334 09/09/16 1141 09/09/16 1439 09/10/16 0303  LATICACIDVEN  --   < > 6.3*  --  1.5 2.6*  --   PROCALCITON <0.10  --   --  2.27  --   --  1.94  < > = values in this interval not displayed. ABG  Recent Labs Lab 09/08/16 0945 09/09/16 0420 09/10/16 0340  PHART 7.339* 7.466* 7.521*  PCO2ART 62.2* 48.3* 40.1  PO2ART 106 128* 81.1*   Liver Enzymes  Recent Labs Lab 09/08/16 0617  AST 42*  ALT 17  ALKPHOS 128*  BILITOT 0.4  ALBUMIN 2.8*   Cardiac Enzymes No results for input(s): TROPONINI, PROBNP in the last 168 hours.  Glucose  Recent Labs Lab 09/10/16 1235 09/10/16 1546 09/10/16 1951 09/10/16  2336 09/11/16 0335 09/11/16 0743  GLUCAP 132* 109* 97 100* 116* 114*   Imaging Dg Chest Port 1 View  Result Date: 09/11/2016 CLINICAL DATA:  Shortness of breath. EXAM: PORTABLE CHEST 1 VIEW COMPARISON:  09/10/2016.  CT 07/14/2016 . FINDINGS: Endotracheal tube, NG tube in stable position. Cardiomegaly again noted. Persistent bilateral pulmonary infiltrates suggesting pulmonary edema again noted. Bilateral pneumonia cannot be excluded. Basilar atelectasis. Small left pleural effusion. No significant interim change. IMPRESSION: 1. Lines and tubes in stable position. 2. Cardiomegaly with persistent bilateral pulmonary infiltrates suggesting pulmonary edema again noted. Bilateral pneumonia cannot be excluded. Small left pleural effusion. No significant change from prior exam. 3. Basilar atelectasis . Electronically Signed   By:  Marcello Moores  Register   On: 09/11/2016 06:18   STUDIES:  CXR 8/24 > Diffuse hazy density of the lungs, favor pulmonary edema. No effusion or pneumothorax. No visible rib fracture post CPR ECHO 8/24 > EF 60-65, G1DD  CULTURES: MRSA PCR 8/24 > negative  Blood 8/24 >>NTD Tracheal Asp 8/24 >>NTD U/A 8/24 > Rare Bacteria Blood cx 8/27>>>  ANTIBIOTICS: Rocephin 8/24>>>8/27 Cefepime 8/27>>> Azithromycin 8/24 >>  SIGNIFICANT EVENTS: 8/24 > Presents to ED post-cardiac arrest   LINES/TUBES: ETT 8/24 >>  DISCUSSION: 56 year old female with extensive health history presents cardiac arrest after respiratory decompensation.   ASSESSMENT / PLAN:  PULMONARY A: Acute on Chronic hypoxic Respiratory failure in setting of CAP vs pulmonary edema  H/O COPD on 6L at baseline, OSA on CPAP  P:   Decreaseed PEEP to 5 and FiO2 40% PS trials but no extubation given agitation and fluid overload Scheduled Duoneb, Pulmicort  Pulmonary Hygiene  Trend CXR and ABG   CARDIOVASCULAR A:  Cardiac Arrest 10-15 minutes before ROSC secondary to respiratory arrest  Acute on Chronic Diastolic HF (G2) (EF 27-78)  H/O CAD, HTN  No indication for cooling as patient is awake and responsive  P:  Cardiac Monitoring  Maintain MAP > 65 Hold HTN medications   RENAL A:   Lactic Acidosis secondary to hypoxia/hypoperfusion vs sepsis   5.0 > 6.3 >>  Acute on Chronic KD (Crt baseline 1.3-1.5) Volume Overload   P:   Trend BMP Replace electrolytes as indicated  Lasix 40 q6h x3 doses Zaroxolyn 5 mg PO x1  GASTROINTESTINAL A:   GERD P:   NPO PPI Continue TF   HEMATOLOGIC A:   No issues  P:  Trend CBC  SCDS Heparin SQ  INFECTIOUS A:   Leukocytosis ?CAP vs Pleural effusion   RVP negative  PCT 0.10 > 2.27   P:   Trend WBC and Fever Curve  Follow culture data  Antibiotics as above  D/C rocephin and change to cefepime Reculture today  ENDOCRINE A:   DM   P:   Trend Glucose SSI    NEUROLOGIC A:   H/O Polysubstance Abuse (Cocaine), Depression -UDS negative  P:   RASS goal: 0/-1 Fentanyl Gtt and PRN versed  Restarted home Lexapro, [name] at 250 mg BID, remeron   FAMILY  - Updates: No family to update bedside  - Inter-disciplinary family meet or Palliative Care meeting due by:  09/15/2016  The patient is critically ill with multiple organ systems failure and requires high complexity decision making for assessment and support, frequent evaluation and titration of therapies, application of advanced monitoring technologies and extensive interpretation of multiple databases.   Critical Care Time devoted to patient care services described in this note is  35  Minutes.  This time reflects time of care of this signee Dr Jennet Maduro. This critical care time does not reflect procedure time, or teaching time or supervisory time of PA/NP/Med student/Med Resident etc but could involve care discussion time.  Rush Farmer, M.D. [facility] Pulmonary/Critical Care Medicine. Pager: [phone]. After hours pager: [phone].

## 2016-09-11 NOTE — Progress Notes (Signed)
Pharmacy Antibiotic Note  Tiffany [name] is a 56 y.o. female admitted on 09/08/2016 post- PEA cardiac arrest with concern for pneumonia. Antibiotics are being escalated today from ceftriaxone to cefepime as the patient is not clinically progressing. Patient is also currently receiving azithromycin. Patient has been persistently febrile since admission, procalcitonin is elevated and creatinine is trending up from baseline to 2.13 today.   Plan: -start cefepime 2g IV q24 hours  -azithromycin 500mg  IV q24 hours -continue to monitor renal function, clinical progress, and cultures    Height: 5\' 4"  (162.6 cm) Weight: 299 lb 6.2 oz (135.8 kg) IBW/kg (Calculated) : 54.7  Temp (24hrs), Avg:100.9 F (38.3 C), Min:99.5 F (37.5 C), Max:102.6 F (39.2 C)   Recent Labs Lab 09/08/16 0617 09/08/16 0948 09/08/16 1248 09/08/16 1537 09/09/16 0334 09/09/16 1141 09/09/16 1439 09/10/16 0303 09/11/16 0505  WBC 20.3*  --   --   --  14.7*  --   --  11.9* 12.0*  CREATININE 1.38*  --   --  1.66* 1.72*  --  1.91* 1.72* 2.13*  LATICACIDVEN  --  5.0* 6.3*  --   --  1.5 2.6*  --   --     Estimated Creatinine Clearance: 40.1 mL/min (A) (by C-G formula based on SCr of 2.13 mg/dL (H)).    Allergies  Allergen Reactions  . Hydroxyzine Shortness Of Breath    -throat closed up  . Latuda [Lurasidone Hcl] Anaphylaxis  . Codeine Nausea And Vomiting  . Sulfa Antibiotics Itching   Antibiotics this admission: Rocephin 8/24 >>8/27 Azithromycin 8/24 >> Cefepime 8/24>>  Cultures: 8/24 BCx: ngx2 8/24 Tracheal Asp: flora 8/24 MRSA PCR: neg  Thank you for allowing pharmacy to be a part of this patient's care.  Jalene Mullet, Pharm.D. PGY1 Pharmacy Resident 09/11/2016 12:55 PM Main Pharmacy: [phone]

## 2016-09-12 ENCOUNTER — Other Ambulatory Visit: Payer: Self-pay | Admitting: *Deleted

## 2016-09-12 ENCOUNTER — Inpatient Hospital Stay (HOSPITAL_COMMUNITY): Payer: Medicare HMO

## 2016-09-12 LAB — BASIC METABOLIC PANEL
Anion gap: 16 — ABNORMAL HIGH (ref 5–15)
BUN: 59 mg/dL — ABNORMAL HIGH (ref 6–20)
CO2: 26 mmol/L (ref 22–32)
Calcium: 9.4 mg/dL (ref 8.9–10.3)
Chloride: 104 mmol/L (ref 101–111)
Creatinine, Ser: 2.43 mg/dL — ABNORMAL HIGH (ref 0.44–1.00)
GFR calc Af Amer: 24 mL/min — ABNORMAL LOW (ref >60)
GFR calc non Af Amer: 21 mL/min — ABNORMAL LOW (ref >60)
Glucose, Bld: 113 mg/dL — ABNORMAL HIGH (ref 65–99)
Potassium: 3.2 mmol/L — ABNORMAL LOW (ref 3.5–5.1)
Sodium: 146 mmol/L — ABNORMAL HIGH (ref 135–145)

## 2016-09-12 LAB — BLOOD GAS, ARTERIAL
Acid-Base Excess: 3.4 mmol/L — ABNORMAL HIGH (ref 0.0–2.0)
Bicarbonate: 26.4 mmol/L (ref 20.0–28.0)
Drawn by: 362771
FIO2: 40
MECHVT: 500 mL
O2 Saturation: 96.5 %
PEEP: 5 cmH2O
Patient temperature: 100.9
RATE: 26 resp/min
pCO2 arterial: 35.4 mmHg (ref 32.0–48.0)
pH, Arterial: 7.491 — ABNORMAL HIGH (ref 7.350–7.450)
pO2, Arterial: 90.5 mmHg (ref 83.0–108.0)

## 2016-09-12 LAB — GLUCOSE, CAPILLARY
Glucose-Capillary: 110 mg/dL — ABNORMAL HIGH (ref 65–99)
Glucose-Capillary: 110 mg/dL — ABNORMAL HIGH (ref 65–99)
Glucose-Capillary: 121 mg/dL — ABNORMAL HIGH (ref 65–99)
Glucose-Capillary: 122 mg/dL — ABNORMAL HIGH (ref 65–99)
Glucose-Capillary: 124 mg/dL — ABNORMAL HIGH (ref 65–99)
Glucose-Capillary: 127 mg/dL — ABNORMAL HIGH (ref 65–99)

## 2016-09-12 LAB — CBC
HCT: 28 % — ABNORMAL LOW (ref 36.0–46.0)
Hemoglobin: 8.1 g/dL — ABNORMAL LOW (ref 12.0–15.0)
MCH: 27.7 pg (ref 26.0–34.0)
MCHC: 28.9 g/dL — ABNORMAL LOW (ref 30.0–36.0)
MCV: 95.9 fL (ref 78.0–100.0)
Platelets: 416 10*3/uL — ABNORMAL HIGH (ref 150–400)
RBC: 2.92 MIL/uL — ABNORMAL LOW (ref 3.87–5.11)
RDW: 17.3 % — ABNORMAL HIGH (ref 11.5–15.5)
WBC: 13 10*3/uL — ABNORMAL HIGH (ref 4.0–10.5)

## 2016-09-12 LAB — PHOSPHORUS: Phosphorus: 5.6 mg/dL — ABNORMAL HIGH (ref 2.5–4.6)

## 2016-09-12 LAB — MAGNESIUM: Magnesium: 2.6 mg/dL — ABNORMAL HIGH (ref 1.7–2.4)

## 2016-09-12 MED ORDER — ALBUMIN HUMAN 25 % IV SOLN
25.0000 g | Freq: Four times a day (QID) | INTRAVENOUS | Status: AC
  Administered 2016-09-12 – 2016-09-13 (×4): 25 g via INTRAVENOUS
  Filled 2016-09-12: qty 100
  Filled 2016-09-12: qty 50
  Filled 2016-09-12 (×2): qty 100

## 2016-09-12 MED ORDER — PANTOPRAZOLE SODIUM 40 MG PO PACK
40.0000 mg | PACK | Freq: Every day | ORAL | Status: DC
  Administered 2016-09-12 – 2016-09-14 (×3): 40 mg
  Filled 2016-09-12 (×3): qty 20

## 2016-09-12 MED ORDER — DOCUSATE SODIUM 50 MG/5ML PO LIQD
100.0000 mg | Freq: Two times a day (BID) | ORAL | Status: DC
  Administered 2016-09-12 – 2016-09-14 (×5): 100 mg
  Filled 2016-09-12 (×7): qty 10

## 2016-09-12 MED ORDER — POTASSIUM CHLORIDE 20 MEQ/15ML (10%) PO SOLN
40.0000 meq | Freq: Three times a day (TID) | ORAL | Status: AC
  Administered 2016-09-12 (×2): 40 meq
  Filled 2016-09-12 (×2): qty 30

## 2016-09-12 MED ORDER — METOLAZONE 5 MG PO TABS
5.0000 mg | ORAL_TABLET | Freq: Every day | ORAL | Status: AC
  Administered 2016-09-12: 5 mg via ORAL
  Filled 2016-09-12: qty 1

## 2016-09-12 MED ORDER — FUROSEMIDE 10 MG/ML IJ SOLN
5.0000 mg/h | INTRAVENOUS | Status: DC
  Administered 2016-09-12: 5 mg/h via INTRAVENOUS
  Filled 2016-09-12: qty 25

## 2016-09-12 MED ORDER — POLYETHYLENE GLYCOL 3350 17 G PO PACK
17.0000 g | PACK | Freq: Every day | ORAL | Status: DC
  Administered 2016-09-12 – 2016-09-21 (×9): 17 g via ORAL
  Filled 2016-09-12 (×10): qty 1

## 2016-09-12 NOTE — Patient Outreach (Signed)
[place] [facility]) Care Management  09/12/2016  Tiffany [name] 1960-03-06 [id number]   [name] attempted to reach patient to follow-up on medicaid application and answer any questions patient may have, but patient's phone (#: [phone]) went straight to voicemail and her mailbox is full. [name] was unable to leave a voicemail, but will follow-up within 2 weeks.    Raynaldo Opitz, LCSW [facility]  Clinical Social Worker cell #: [phone]

## 2016-09-12 NOTE — Progress Notes (Signed)
PULMONARY / CRITICAL CARE MEDICINE   Name: Tiffany [name] MRN: [id number] DOB: [birth year]    ADMISSION DATE:  09/08/2016 CONSULTATION DATE:  8/24  REFERRING MD:  Dr. Leonides Schanz EDP  CHIEF COMPLAINT:  Cardiac arrest  HISTORY OF PRESENT ILLNESS:   56 year old female with PMH as below, which is significant for COPD on 6L O2, CHF, CKD, cocaine abuse, CAD, OSA on CPAP, and DM.   8/24 AM she awoke markedly dyspneic and her son checked her O2 sat, which was 60%. EMS was called. Upon arrival to ED she had agonal respirations and was found to be pulseless. CPR was initiated and continued for approximately 10-15 minutes PEA. She was placed on ventilator post arrest and PCCM was asked to evaluate. CXR consistent with pulmonary edema.   SUBJECTIVE:  Less agitated, diuresed well overnight.  VITAL SIGNS: BP (!) 89/48   Pulse 85   Temp (!) 101.1 F (38.4 C)   Resp (!) 30   Ht 5\' 4"  (1.626 m)   Wt 127.9 kg (281 lb 15.5 oz)   SpO2 93%   BMI 48.40 kg/m   HEMODYNAMICS:    VENTILATOR SETTINGS: Vent Mode: PRVC FiO2 (%):  [40 %] 40 % Set Rate:  [26 bmp] 26 bmp Vt Set:  [500 mL] 500 mL PEEP:  [5 cmH20] 5 cmH20 Plateau Pressure:  [17 cmH20-24 cmH20] 22 cmH20  INTAKE / OUTPUT: I/O last 3 completed shifts: In: 3480.8 [I.V.:1250.8; NG/GT:1880; IV Piggyback:350] Out: 7775 [Urine:7775]  PHYSICAL EXAMINATION: General:  Adult female, no distress, agitated at times Neuro:  Awake and following commands, moving all ext HEENT:  [place]/AT, PERRL, EOM-I and MMM Cardiovascular:  RRR, Nl S1/S2, -M/R/G. Lungs:  CTA bilaterally Abdomen:  Soft, NT, ND and +BS Musculoskeletal: +1 edema to BLE  Skin:  Warm, dry, intact   LABS:  BMET  Recent Labs Lab 09/10/16 0303 09/11/16 0505 09/12/16 0341  NA 145 147* 146*  K 3.2* 4.0 3.2*  CL 104 110 104  CO2 30 26 26   BUN 39* 46* 59*  CREATININE 1.72* 2.13* 2.43*  GLUCOSE 102* 110* 113*   Electrolytes  Recent Labs Lab 09/10/16 0303 09/11/16 0505  09/12/16 0341  CALCIUM 8.9 9.1 9.4  MG 2.2 2.4 2.6*  PHOS 3.9 4.7* 5.6*   CBC  Recent Labs Lab 09/10/16 0303 09/11/16 0505 09/12/16 0341  WBC 11.9* 12.0* 13.0*  HGB 7.1* 7.6* 8.1*  HCT 24.2* 25.9* 28.0*  PLT 323 323 416*   Coag's  Recent Labs Lab 09/08/16 0623  INR 1.15   Sepsis Markers  Recent Labs Lab 09/08/16 0725  09/08/16 1248 09/09/16 0334 09/09/16 1141 09/09/16 1439 09/10/16 0303  LATICACIDVEN  --   < > 6.3*  --  1.5 2.6*  --   PROCALCITON <0.10  --   --  2.27  --   --  1.94  < > = values in this interval not displayed. ABG  Recent Labs Lab 09/09/16 0420 09/10/16 0340 09/12/16 0220  PHART 7.466* 7.521* 7.491*  PCO2ART 48.3* 40.1 35.4  PO2ART 128* 81.1* 90.5   Liver Enzymes  Recent Labs Lab 09/08/16 0617  AST 42*  ALT 17  ALKPHOS 128*  BILITOT 0.4  ALBUMIN 2.8*   Cardiac Enzymes No results for input(s): TROPONINI, PROBNP in the last 168 hours.  Glucose  Recent Labs Lab 09/11/16 1143 09/11/16 1517 09/11/16 2136 09/12/16 0005 09/12/16 0346 09/12/16 0810  GLUCAP 108* 124* 123* 127* 110* 121*   Imaging Dg Chest Port 1  View  Result Date: 09/12/2016 CLINICAL DATA:  Intubation. EXAM: PORTABLE CHEST 1 VIEW COMPARISON:  09/11/2016 . FINDINGS: Endotracheal tube and NG tube in stable position. Cardiomegaly. Slight improvement interstitial prominence suggesting partial clearing of CHF . Again basilar pneumonia cannot be excluded. Small left pleural effusion again noted. Scratched No pneumothorax . IMPRESSION: 1. Lines and tubes in stable position. 2. Cardiomegaly. Slight improvement of interstitial prominence suggesting partial clearing of CHF . Again basilar pneumonia cannot be excluded Electronically Signed   By: Marcello Moores  Register   On: 09/12/2016 07:24   STUDIES:  CXR 8/24 > Diffuse hazy density of the lungs, favor pulmonary edema. No effusion or pneumothorax. No visible rib fracture post CPR ECHO 8/24 > EF 60-65, G1DD  CULTURES: MRSA  PCR 8/24 > negative  Blood 8/24 >>NTD Tracheal Asp 8/24 >>NTD U/A 8/24 > Rare Bacteria Blood cx 8/27>>>  ANTIBIOTICS: Rocephin 8/24>>>8/27 Cefepime 8/27>>> Azithromycin 8/24 >>8/28  SIGNIFICANT EVENTS: 8/24 > Presents to ED post-cardiac arrest   LINES/TUBES: ETT 8/24 >>  DISCUSSION: 56 year old female with extensive health history presents cardiac arrest after respiratory decompensation.   ASSESSMENT / PLAN:  PULMONARY A: Acute on Chronic hypoxic Respiratory failure in setting of CAP vs pulmonary edema  H/O COPD on 6L at baseline, OSA on CPAP  P:   Decreased PEEP to 5 and FiO2 40% Failed PS trials this AM, will change back to full support and diureses Scheduled Duoneb, Pulmicort  Pulmonary Hygiene  Trend CXR and ABG   CARDIOVASCULAR A:  Cardiac Arrest 10-15 minutes before ROSC secondary to respiratory arrest  Acute on Chronic Diastolic HF (G2) (EF 09-98)  H/O CAD, HTN  No indication for cooling as patient is awake and responsive  P:  Cardiac Monitoring  Maintain MAP > 65 Hold HTN medications   RENAL A:   Lactic Acidosis secondary to hypoxia/hypoperfusion vs sepsis   5.0 > 6.3 >>  Acute on Chronic KD (Crt baseline 1.3-1.5) Volume Overload   P:   Trend BMP Replace electrolytes as indicated  Lasix 40 q6h x3 doses Zaroxolyn 5 mg PO x1 KVO IVF  GASTROINTESTINAL A:   GERD P:   NPO PPI Continue TF   HEMATOLOGIC A:   No issues  P:  Trend CBC  SCDS Heparin SQ  INFECTIOUS A:   Leukocytosis ?CAP vs Pleural effusion   RVP negative  PCT 0.10 > 2.27   P:   Trend WBC and Fever Curve  Follow culture data  Antibiotics as above  D/C rocephin and change to cefepime D/C zithromax today (5 day course complete) Recultures pending  ENDOCRINE A:   DM   P:   Trend Glucose SSI   NEUROLOGIC A:   H/O Polysubstance Abuse (Cocaine), Depression -UDS negative  P:   RASS goal: 0/-1 Fentanyl Gtt and PRN versed  Restarted home Lexapro, [name] at  250 mg BID, remeron   FAMILY  - Updates: Spoke with mother, patient would not want to be on life support indefinitely but would like her children involved, so plan on family meeting on Thursday.  - Inter-disciplinary family meet or Palliative Care meeting due by:  09/15/2016  The patient is critically ill with multiple organ systems failure and requires high complexity decision making for assessment and support, frequent evaluation and titration of therapies, application of advanced monitoring technologies and extensive interpretation of multiple databases.   Critical Care Time devoted to patient care services described in this note is  35  Minutes. This time reflects  time of care of this signee Dr Jennet Maduro. This critical care time does not reflect procedure time, or teaching time or supervisory time of PA/NP/Med student/Med Resident etc but could involve care discussion time.  Rush Farmer, M.D. [facility] Pulmonary/Critical Care Medicine. Pager: [phone]. After hours pager: [phone].

## 2016-09-12 NOTE — Care Management Note (Signed)
Case Management Note  Patient Details  Name: Tiffany [name] MRN: [id number] Date of Birth: 12/24/1960  Subjective/Objective:  Pt admitted with cardiac arrest  - downtime occurred before ROSC            Action/Plan:  Recently discharged from [facility] to the home.  Pt is active with [facility] for PT, OT and Speech - agency aware of admit.   Pt is currently ventilated - family meeting scheduled for 09/14/16.  CM will continue to follow for discharge needs   Expected Discharge Date:                  Expected Discharge Plan:     In-House Referral:     Discharge planning Services  CM Consult  Post Acute Care Choice:    Choice offered to:     DME Arranged:    DME Agency:     HH Arranged:    HH Agency:     Status of Service:     If discussed at [employer], dates discussed:    Additional Comments:  Maryclare Labrador, RN 09/12/2016, 3:15 PM

## 2016-09-12 NOTE — Progress Notes (Signed)
[facility] ADULT ICU REPLACEMENT PROTOCOL FOR AM LAB REPLACEMENT ONLY  The patient does not apply for the [facility] Adult ICU Electrolyte Replacment Protocol based on the criteria listed below:   1. Is GFR >/= 40 ml/min? No.  Patient's GFR today is 24 2. Is urine output >/= 0.5 ml/kg/hr for the last 6 hours? Yes.   Patient's UOP is 0.71 ml/kg/hr 3. Is BUN < 60 mg/dL? Yes.    Patient's BUN today is 59 4. Abnormal electrolyte(s): K=3.2 5. Ordered repletion with: NA 6. If a panic level lab has been reported, has the CCM MD in charge been notified? Yes.  .   Physician:  Chase Caller, MD  Tiffany, Greenstein [name] 09/12/2016 6:12 AM

## 2016-09-13 ENCOUNTER — Inpatient Hospital Stay (HOSPITAL_COMMUNITY): Payer: Medicare HMO

## 2016-09-13 LAB — CULTURE, BLOOD (ROUTINE X 2)
Culture: NO GROWTH
Culture: NO GROWTH
Special Requests: ADEQUATE
Special Requests: ADEQUATE

## 2016-09-13 LAB — BLOOD GAS, ARTERIAL
Acid-Base Excess: 4.6 mmol/L — ABNORMAL HIGH (ref 0.0–2.0)
Bicarbonate: 27.6 mmol/L (ref 20.0–28.0)
Drawn by: 31777
FIO2: 40
MECHVT: 500 mL
O2 Saturation: 95.9 %
PEEP: 5 cmH2O
Patient temperature: 98.6
RATE: 26 resp/min
pCO2 arterial: 34.5 mmHg (ref 32.0–48.0)
pH, Arterial: 7.514 — ABNORMAL HIGH (ref 7.350–7.450)
pO2, Arterial: 73.1 mmHg — ABNORMAL LOW (ref 83.0–108.0)

## 2016-09-13 LAB — GLUCOSE, CAPILLARY
Glucose-Capillary: 103 mg/dL — ABNORMAL HIGH (ref 65–99)
Glucose-Capillary: 110 mg/dL — ABNORMAL HIGH (ref 65–99)
Glucose-Capillary: 112 mg/dL — ABNORMAL HIGH (ref 65–99)
Glucose-Capillary: 112 mg/dL — ABNORMAL HIGH (ref 65–99)
Glucose-Capillary: 114 mg/dL — ABNORMAL HIGH (ref 65–99)
Glucose-Capillary: 123 mg/dL — ABNORMAL HIGH (ref 65–99)
Glucose-Capillary: 124 mg/dL — ABNORMAL HIGH (ref 65–99)

## 2016-09-13 LAB — PHOSPHORUS: Phosphorus: 5.1 mg/dL — ABNORMAL HIGH (ref 2.5–4.6)

## 2016-09-13 LAB — CBC
HCT: 27.3 % — ABNORMAL LOW (ref 36.0–46.0)
Hemoglobin: 7.9 g/dL — ABNORMAL LOW (ref 12.0–15.0)
MCH: 27.6 pg (ref 26.0–34.0)
MCHC: 28.9 g/dL — ABNORMAL LOW (ref 30.0–36.0)
MCV: 95.5 fL (ref 78.0–100.0)
Platelets: 479 10*3/uL — ABNORMAL HIGH (ref 150–400)
RBC: 2.86 MIL/uL — ABNORMAL LOW (ref 3.87–5.11)
RDW: 16.9 % — ABNORMAL HIGH (ref 11.5–15.5)
WBC: 12.8 10*3/uL — ABNORMAL HIGH (ref 4.0–10.5)

## 2016-09-13 LAB — BASIC METABOLIC PANEL
Anion gap: 14 (ref 5–15)
BUN: 77 mg/dL — ABNORMAL HIGH (ref 6–20)
CO2: 28 mmol/L (ref 22–32)
Calcium: 9.7 mg/dL (ref 8.9–10.3)
Chloride: 106 mmol/L (ref 101–111)
Creatinine, Ser: 2.5 mg/dL — ABNORMAL HIGH (ref 0.44–1.00)
GFR calc Af Amer: 23 mL/min — ABNORMAL LOW (ref >60)
GFR calc non Af Amer: 20 mL/min — ABNORMAL LOW (ref >60)
Glucose, Bld: 116 mg/dL — ABNORMAL HIGH (ref 65–99)
Potassium: 3 mmol/L — ABNORMAL LOW (ref 3.5–5.1)
Sodium: 148 mmol/L — ABNORMAL HIGH (ref 135–145)

## 2016-09-13 LAB — MAGNESIUM: Magnesium: 2.7 mg/dL — ABNORMAL HIGH (ref 1.7–2.4)

## 2016-09-13 MED ORDER — POTASSIUM CHLORIDE 20 MEQ/15ML (10%) PO SOLN: 40.0000 meq | Freq: Once | ORAL | Status: DC

## 2016-09-13 MED ORDER — TORSEMIDE 20 MG PO TABS
60.0000 mg | ORAL_TABLET | Freq: Two times a day (BID) | ORAL | Status: DC
  Administered 2016-09-13 – 2016-09-14 (×3): 60 mg
  Filled 2016-09-13 (×3): qty 3

## 2016-09-13 MED ORDER — POTASSIUM CHLORIDE 20 MEQ PO PACK
40.0000 meq | PACK | Freq: Two times a day (BID) | ORAL | Status: DC
  Administered 2016-09-13 (×2): 40 meq
  Filled 2016-09-13 (×4): qty 2

## 2016-09-13 MED ORDER — RISPERIDONE 1 MG/ML PO SOLN
1.0000 mg | Freq: Every day | ORAL | Status: DC
  Administered 2016-09-13 – 2016-09-14 (×2): 1 mg
  Filled 2016-09-13 (×2): qty 1

## 2016-09-13 MED ORDER — BENZTROPINE MESYLATE 0.5 MG PO TABS
0.5000 mg | ORAL_TABLET | Freq: Every day | ORAL | Status: DC
  Administered 2016-09-14: 0.5 mg
  Filled 2016-09-13 (×2): qty 1

## 2016-09-13 MED ORDER — ESCITALOPRAM OXALATE 20 MG PO TABS
20.0000 mg | ORAL_TABLET | Freq: Every day | ORAL | Status: DC
  Administered 2016-09-14: 20 mg
  Filled 2016-09-13 (×2): qty 1

## 2016-09-13 MED ORDER — MIRTAZAPINE 30 MG PO TABS
30.0000 mg | ORAL_TABLET | Freq: Every day | ORAL | Status: DC
  Administered 2016-09-13 – 2016-09-14 (×2): 30 mg
  Filled 2016-09-13 (×2): qty 1

## 2016-09-13 MED ORDER — WHITE PETROLATUM GEL
Status: AC
  Administered 2016-09-13: 1
  Filled 2016-09-13: qty 1

## 2016-09-13 NOTE — Progress Notes (Signed)
PULMONARY / CRITICAL CARE MEDICINE   Name: Tiffany [name] MRN: [id number] DOB: [birth year]    ADMISSION DATE:  09/08/2016 CONSULTATION DATE:  09/08/16  REFERRING MD:  Ward EDP  CHIEF COMPLAINT:  Cardiac arrest  BRIEF:   56 y/o female with schizophrenia and COPD on 6L O2 at home admitted with cardiac arrest (PEA) on 8/24.  SUBJECTIVE:  Fever persists  VITAL SIGNS: BP 136/71   Pulse 72   Temp (!) 100.8 F (38.2 C)   Resp (!) 26   Ht 5\' 4"  (1.626 m)   Wt 131.2 kg (289 lb 3.9 oz)   SpO2 93%   BMI 49.65 kg/m   HEMODYNAMICS:    VENTILATOR SETTINGS: Vent Mode: PSV;CPAP FiO2 (%):  [40 %] 40 % Set Rate:  [26 bmp] 26 bmp Vt Set:  [500 mL] 500 mL PEEP:  [5 cmH20] 5 cmH20 Pressure Support:  [15 cmH20] 15 cmH20 Plateau Pressure:  [20 cmH20-26 cmH20] 21 cmH20  INTAKE / OUTPUT: I/O last 3 completed shifts: In: 3915.8 [I.V.:1325.8; NG/GT:2140; IV Piggyback:450] Out: 2409 [Urine:4850]  PHYSICAL EXAMINATION:  General:  In bed on vent HENT: NCAT ETT in place PULM: CTA B, vent supported breathing CV: RRR, no mgr GI: BS+, soft, nontender MSK: normal bulk and tone Neuro: sedated on vent    LABS:  BMET  Recent Labs Lab 09/11/16 0505 09/12/16 0341 09/13/16 0410  NA 147* 146* 148*  K 4.0 3.2* 3.0*  CL 110 104 106  CO2 26 26 28   BUN 46* 59* 77*  CREATININE 2.13* 2.43* 2.50*  GLUCOSE 110* 113* 116*    Electrolytes  Recent Labs Lab 09/11/16 0505 09/12/16 0341 09/13/16 0410  CALCIUM 9.1 9.4 9.7  MG 2.4 2.6* 2.7*  PHOS 4.7* 5.6* 5.1*    CBC  Recent Labs Lab 09/11/16 0505 09/12/16 0341 09/13/16 0410  WBC 12.0* 13.0* 12.8*  HGB 7.6* 8.1* 7.9*  HCT 25.9* 28.0* 27.3*  PLT 323 416* 479*    Coag's  Recent Labs Lab 09/08/16 0623  INR 1.15    Sepsis Markers  Recent Labs Lab 09/08/16 0725  09/08/16 1248 09/09/16 0334 09/09/16 1141 09/09/16 1439 09/10/16 0303  LATICACIDVEN  --   < > 6.3*  --  1.5 2.6*  --   PROCALCITON <0.10  --   --   2.27  --   --  1.94  < > = values in this interval not displayed.  ABG  Recent Labs Lab 09/10/16 0340 09/12/16 0220 09/13/16 0410  PHART 7.521* 7.491* 7.514*  PCO2ART 40.1 35.4 34.5  PO2ART 81.1* 90.5 73.1*    Liver Enzymes  Recent Labs Lab 09/08/16 0617  AST 42*  ALT 17  ALKPHOS 128*  BILITOT 0.4  ALBUMIN 2.8*    Cardiac Enzymes No results for input(s): TROPONINI, PROBNP in the last 168 hours.  Glucose  Recent Labs Lab 09/12/16 1212 09/12/16 1601 09/12/16 1955 09/13/16 0034 09/13/16 0345 09/13/16 0832  GLUCAP 110* 122* 124* 124* 123* 110*    Imaging Dg Chest Port 1 View  Result Date: 09/13/2016 CLINICAL DATA:  Shortness of breath. EXAM: PORTABLE CHEST 1 VIEW COMPARISON:  09/12/2016 . FINDINGS: Endotracheal tube and NG tube in stable position. Stable cardiomegaly. Slight progression of basilar atelectasis and infiltrates. Small left pleural effusion. No pneumothorax P IMPRESSION: 1.  Lines and tubes in stable position. 2. Slight progression of of basilar atelectasis and infiltrates. Small left pleural effusion. 3.  Persistent cardiomegaly with mild pulmonary venous congestion. Electronically Signed   By:  Little Browning   On: 09/13/2016 06:42     STUDIES:  Echo 8/24 > LVEF 60-65%, RV normal  CULTURES: 8/24 Resp viral panel negative 8/24 resp > GPC pairs, Neg final 8/24 blood >  8/27 blood >  ANTIBIOTICS: 8/24 azithro > 8/28 8/27 cefepime > 8/29 8/24 ceftriaxne > 8/27  SIGNIFICANT EVENTS:   LINES/TUBES: 8/24 ETT >   DISCUSSION: 56 y/o female s/p cardiac arrest.  Has baseline schizophrenia and COPD.  ASSESSMENT / PLAN:  PULMONARY A: Acute respiratory failure with hypoxemia OSA Acute pulmonary edema P:   Continue diuresis Full mechanical vent support VAP prevention Daily WUA/SBT Wean vent as able  CARDIOVASCULAR A:  Acute Diastolic heart failure Hypertension P:  Stop lasix drip Start back home dose torsemide Monitor blood  pressure Tele   RENAL A:   CKD Hypokalemia P:   Monitor BMET and UOP Replace electrolytes as needed Schedule KCL today   GASTROINTESTINAL A:   No acute issues P:   Continue tube feeding Continue PPI  HEMATOLOGIC A:   Anemia without bleeding P:  Monitor for bleeding DVT prophylaxis with heparin  INFECTIOUS A:   Fever without clear sign of infection UTI? P:   Stop antibiotics F/u cultures  ENDOCRINE A:   DM2   P:   Continue SSI  NEUROLOGIC A:   Baseline schizophrenia Cocaine abuse P:   RASS goal: 0 to -1 Continue PAD protocol with fentanyl Start Geodon today assuming QTc OK Continue mood stabilizers with Lexapro, Remeron, depakote   FAMILY  - Updates: none bedside  - Inter-disciplinary family meet or Palliative Care meeting due by:  day 7  My cc time 35 minutes  Roselie Awkward, MD [place] PCCM Pager: [phone] Cell: [phone] After 3pm or if no response, call [phone]   09/13/2016, 8:48 AM

## 2016-09-13 NOTE — Progress Notes (Signed)
[facility]  Patient Status: Active (receiving services up to time of hospitalization)  [facility] is providing the following services: PT and OT  If patient discharges after hours, please call [phone].   Tiffany [name] 09/13/2016, 10:24 AM

## 2016-09-13 NOTE — Consult Note (Signed)
   [facility] Inpatient Consult   09/13/2016  ALURA OLVEDA 1960/12/18 [id number]   Spoke with inpatient RNCM to discuss that [address] [facility] has been active with patient.   Mrs. Karel remains on vent in ICU.  Will continue to follow along for disposition and plans.   Will update [facility] team as needed.   Marthenia Rolling, MSN-Ed, RN,BSN [facility] Liaison [phone]

## 2016-09-13 NOTE — Progress Notes (Signed)
   [facility] Physician Progress Note and Electrolyte Replacement  Patient Name: Tiffany [name] DOB: Oct 26, 1960 MRN: [id number]  Date of Service  09/13/2016   HPI/Events of Note    Recent Labs Lab 09/09/16 0334 09/09/16 1439 09/10/16 0303 09/11/16 0505 09/12/16 0341 09/13/16 0410  NA 143 145 145 147* 146* 148*  K 4.4 3.8 3.2* 4.0 3.2* 3.0*  CL 102 103 104 110 104 106  CO2 31 32 30 26 26 28   GLUCOSE 105* 100* 102* 110* 113* 116*  BUN 31* 34* 39* 46* 59* 77*  CREATININE 1.72* 1.91* 1.72* 2.13* 2.43* 2.50*  CALCIUM 9.0 9.0 8.9 9.1 9.4 9.7  MG 2.2  --  2.2 2.4 2.6* 2.7*  PHOS 2.0*  --  3.9 4.7* 5.6* 5.1*    Estimated Creatinine Clearance: 33.4 mL/min (A) (by C-G formula based on SCr of 2.5 mg/dL (H)).  Intake/Output      08/28 0701 - 08/29 0700   I.V. (mL/kg) 900 (6.9)   NG/GT 1690   IV Piggyback 450   Total Intake(mL/kg) 3040 (23.2)   Urine (mL/kg/hr) 2700 (0.9)   Total Output 2700   Net +340        - I/O DETAILED x 24h    Total I/O In: 1170 [I.V.:530; NG/GT:540; IV Piggyback:100] Out: 1500 [Urine:1500] - I/O THIS SHIFT    ASSESSMENT k 3.0 - makes good urine  eICURN Interventions  41meq kcl via tube   ASSESSMENT: MAJOR ELECTROLYTE      Dr. Brand Males, M.D., [facility].C.P Pulmonary and Critical Care Medicine Staff Physician [facility] Pager: [phone], If no answer or between  15:00h - 7:00h: call [phone]  09/13/2016 6:57 AM

## 2016-09-14 ENCOUNTER — Other Ambulatory Visit: Payer: Self-pay | Admitting: Internal Medicine

## 2016-09-14 ENCOUNTER — Other Ambulatory Visit: Payer: Self-pay | Admitting: *Deleted

## 2016-09-14 ENCOUNTER — Inpatient Hospital Stay (HOSPITAL_COMMUNITY): Payer: Medicare HMO

## 2016-09-14 ENCOUNTER — Other Ambulatory Visit (HOSPITAL_COMMUNITY): Payer: Self-pay | Admitting: Psychiatry

## 2016-09-14 DIAGNOSIS — F200 Paranoid schizophrenia: Secondary | ICD-10-CM

## 2016-09-14 LAB — BASIC METABOLIC PANEL
Anion gap: 15 (ref 5–15)
BUN: 103 mg/dL — ABNORMAL HIGH (ref 6–20)
CO2: 29 mmol/L (ref 22–32)
Calcium: 9.8 mg/dL (ref 8.9–10.3)
Chloride: 105 mmol/L (ref 101–111)
Creatinine, Ser: 2.84 mg/dL — ABNORMAL HIGH (ref 0.44–1.00)
GFR calc Af Amer: 20 mL/min — ABNORMAL LOW (ref >60)
GFR calc non Af Amer: 17 mL/min — ABNORMAL LOW (ref >60)
Glucose, Bld: 117 mg/dL — ABNORMAL HIGH (ref 65–99)
Potassium: 3.7 mmol/L (ref 3.5–5.1)
Sodium: 149 mmol/L — ABNORMAL HIGH (ref 135–145)

## 2016-09-14 LAB — GLUCOSE, CAPILLARY
Glucose-Capillary: 100 mg/dL — ABNORMAL HIGH (ref 65–99)
Glucose-Capillary: 115 mg/dL — ABNORMAL HIGH (ref 65–99)
Glucose-Capillary: 115 mg/dL — ABNORMAL HIGH (ref 65–99)
Glucose-Capillary: 137 mg/dL — ABNORMAL HIGH (ref 65–99)
Glucose-Capillary: 137 mg/dL — ABNORMAL HIGH (ref 65–99)
Glucose-Capillary: 94 mg/dL (ref 65–99)

## 2016-09-14 LAB — CBC WITH DIFFERENTIAL/PLATELET
Basophils Absolute: 0 10*3/uL (ref 0.0–0.1)
Basophils Relative: 0 %
Eosinophils Absolute: 0.5 10*3/uL (ref 0.0–0.7)
Eosinophils Relative: 3 %
HCT: 27.8 % — ABNORMAL LOW (ref 36.0–46.0)
Hemoglobin: 8.1 g/dL — ABNORMAL LOW (ref 12.0–15.0)
Lymphocytes Relative: 21 %
Lymphs Abs: 2.8 10*3/uL (ref 0.7–4.0)
MCH: 28.6 pg (ref 26.0–34.0)
MCHC: 29.1 g/dL — ABNORMAL LOW (ref 30.0–36.0)
MCV: 98.2 fL (ref 78.0–100.0)
Monocytes Absolute: 0.9 10*3/uL (ref 0.1–1.0)
Monocytes Relative: 7 %
Neutro Abs: 9.1 10*3/uL — ABNORMAL HIGH (ref 1.7–7.7)
Neutrophils Relative %: 69 %
Platelets: 498 10*3/uL — ABNORMAL HIGH (ref 150–400)
RBC: 2.83 MIL/uL — ABNORMAL LOW (ref 3.87–5.11)
RDW: 17.4 % — ABNORMAL HIGH (ref 11.5–15.5)
WBC: 13.3 10*3/uL — ABNORMAL HIGH (ref 4.0–10.5)

## 2016-09-14 MED ORDER — ZIPRASIDONE HCL 80 MG PO CAPS
80.0000 mg | ORAL_CAPSULE | Freq: Every day | ORAL | Status: DC
  Administered 2016-09-14 – 2016-09-20 (×7): 80 mg via ORAL
  Filled 2016-09-14 (×7): qty 1

## 2016-09-14 MED ORDER — ORAL CARE MOUTH RINSE
15.0000 mL | Freq: Two times a day (BID) | OROMUCOSAL | Status: DC
  Administered 2016-09-14 (×2): 15 mL via OROMUCOSAL

## 2016-09-14 MED ORDER — POTASSIUM CHLORIDE 20 MEQ/15ML (10%) PO SOLN
40.0000 meq | Freq: Four times a day (QID) | ORAL | Status: DC
  Administered 2016-09-14: 40 meq via ORAL
  Filled 2016-09-14 (×2): qty 30

## 2016-09-14 MED ORDER — TORSEMIDE 20 MG PO TABS
60.0000 mg | ORAL_TABLET | Freq: Every day | ORAL | Status: DC
  Administered 2016-09-15 – 2016-09-21 (×7): 60 mg
  Filled 2016-09-14 (×7): qty 3

## 2016-09-14 MED ORDER — FLUTICASONE FUROATE-VILANTEROL 100-25 MCG/INH IN AEPB: INHALATION_SPRAY | RESPIRATORY_TRACT | 0 refills | Status: DC

## 2016-09-14 MED ORDER — BISACODYL 10 MG RE SUPP: 10.0000 mg | Freq: Once | RECTAL | Status: DC

## 2016-09-14 MED ORDER — HYDRALAZINE HCL 20 MG/ML IJ SOLN: 10.0000 mg | INTRAMUSCULAR | Status: DC | PRN

## 2016-09-14 MED ORDER — CHLORHEXIDINE GLUCONATE 0.12 % MT SOLN
15.0000 mL | Freq: Two times a day (BID) | OROMUCOSAL | Status: DC
  Administered 2016-09-14: 15 mL via OROMUCOSAL

## 2016-09-14 MED ORDER — ORAL CARE MOUTH RINSE
15.0000 mL | Freq: Two times a day (BID) | OROMUCOSAL | Status: DC
  Administered 2016-09-14: 15 mL via OROMUCOSAL

## 2016-09-14 MED ORDER — FENTANYL CITRATE (PF) 100 MCG/2ML IJ SOLN: 12.5000 ug | INTRAMUSCULAR | Status: DC | PRN

## 2016-09-14 MED ORDER — POTASSIUM CHLORIDE CRYS ER 20 MEQ PO TBCR
40.0000 meq | EXTENDED_RELEASE_TABLET | Freq: Once | ORAL | Status: AC
  Administered 2016-09-14: 40 meq via ORAL
  Filled 2016-09-14: qty 2

## 2016-09-14 NOTE — Telephone Encounter (Signed)
Received refill request for Breo 100/25 mcg. Medication is not on current med list.  Derl Barrow, RN

## 2016-09-14 NOTE — Procedures (Signed)
Extubation Procedure Note  Patient Details:   Name: Tiffany [name] DOB: 02/23/1960 MRN: [id number]   Airway Documentation:     Evaluation  O2 sats: stable throughout Complications: No apparent complications Patient did tolerate procedure well. Bilateral Breath Sounds: Diminished   Yes   Patient tolerated wean. MD ordered to extubate. Positive cuff leak. Patient extubated to a 6 Lpm nasal cannula. No signs of dyspnea or stridor. Patient instructed on the Incentive Spirometer achieving 1250 mL five times. RN at bedside.   Myrtie Neither 09/14/2016, 10:10 AM

## 2016-09-14 NOTE — Telephone Encounter (Signed)
Refill for [facility] sent to pharmacy. Thanks!

## 2016-09-14 NOTE — Progress Notes (Signed)
PULMONARY / CRITICAL CARE MEDICINE   Name: Tiffany [name] MRN: [id number] DOB: [birth year]    ADMISSION DATE:  09/08/2016 CONSULTATION DATE:  09/08/16  REFERRING MD:  Ward EDP  CHIEF COMPLAINT:  Cardiac arrest  BRIEF:   56 y/o female with schizophrenia and COPD on 6L O2 at home admitted with cardiac arrest (PEA) on 8/24.  SUBJECTIVE:  Diuresed well yesterday Renal function a little worse Fever persists  VITAL SIGNS: BP 129/71   Pulse 72   Temp (!) 100.4 F (38 C)   Resp (!) 29   Ht 5\' 4"  (1.626 m)   Wt 126.6 kg (279 lb 1.6 oz)   SpO2 94%   BMI 47.91 kg/m   HEMODYNAMICS:    VENTILATOR SETTINGS: Vent Mode: PSV;CPAP FiO2 (%):  [40 %] 40 % Set Rate:  [16 bmp-26 bmp] 16 bmp Vt Set:  [500 mL] 500 mL PEEP:  [5 cmH20-10 cmH20] 10 cmH20 Plateau Pressure:  [19 cmH20-23 cmH20] 19 cmH20  INTAKE / OUTPUT: I/O last 3 completed shifts: In: 2470.3 [I.V.:1195; NG/GT:1175.3; IV Piggyback:100] Out: 2694 [Urine:5250]  PHYSICAL EXAMINATION:  General:  In bed on vent HENT: NCAT ETT in place PULM: CTA B, vent supported breathing CV: RRR, no mgr GI: BS+, soft, nontender MSK: normal bulk and tone Neuro: awake, alert, following commands     LABS:  BMET  Recent Labs Lab 09/12/16 0341 09/13/16 0410 09/14/16 0231  NA 146* 148* 149*  K 3.2* 3.0* 3.7  CL 104 106 105  CO2 26 28 29   BUN 59* 77* 103*  CREATININE 2.43* 2.50* 2.84*  GLUCOSE 113* 116* 117*    Electrolytes  Recent Labs Lab 09/11/16 0505 09/12/16 0341 09/13/16 0410 09/14/16 0231  CALCIUM 9.1 9.4 9.7 9.8  MG 2.4 2.6* 2.7*  --   PHOS 4.7* 5.6* 5.1*  --     CBC  Recent Labs Lab 09/12/16 0341 09/13/16 0410 09/14/16 0231  WBC 13.0* 12.8* 13.3*  HGB 8.1* 7.9* 8.1*  HCT 28.0* 27.3* 27.8*  PLT 416* 479* 498*    Coag's  Recent Labs Lab 09/08/16 0623  INR 1.15    Sepsis Markers  Recent Labs Lab 09/08/16 0725  09/08/16 1248 09/09/16 0334 09/09/16 1141 09/09/16 1439 09/10/16 0303   LATICACIDVEN  --   < > 6.3*  --  1.5 2.6*  --   PROCALCITON <0.10  --   --  2.27  --   --  1.94  < > = values in this interval not displayed.  ABG  Recent Labs Lab 09/10/16 0340 09/12/16 0220 09/13/16 0410  PHART 7.521* 7.491* 7.514*  PCO2ART 40.1 35.4 34.5  PO2ART 81.1* 90.5 73.1*    Liver Enzymes  Recent Labs Lab 09/08/16 0617  AST 42*  ALT 17  ALKPHOS 128*  BILITOT 0.4  ALBUMIN 2.8*    Cardiac Enzymes No results for input(s): TROPONINI, PROBNP in the last 168 hours.  Glucose  Recent Labs Lab 09/13/16 1217 09/13/16 1602 09/13/16 2019 09/13/16 2336 09/14/16 0423 09/14/16 0814  GLUCAP 112* 103* 114* 112* 115* 115*    Imaging Dg Chest Port 1 View  Result Date: 09/14/2016 CLINICAL DATA:  Respiratory failure. EXAM: PORTABLE CHEST 1 VIEW COMPARISON:  09/13/2016 . FINDINGS: Endotracheal tube and NG tube in stable position. Cardiomegaly with persistent pulmonary venous congestion. Persistent basilar infiltrates/edema. Small left pleural effusion again noted. No significant interim change. Low lung volumes. No pneumothorax. IMPRESSION: 1. Lines and tubes in stable position. 2. Persistent cardiomegaly with persistent basilar infiltrates/edema.  Persistent small left pleural effusion. No interim change. 3. Low lung volumes. Electronically Signed   By: Marcello Moores  Register   On: 09/14/2016 06:49     STUDIES:  Echo 8/24 > LVEF 60-65%, RV normal  CULTURES: 8/24 Resp viral panel negative 8/24 resp > GPC pairs, Neg final 8/24 blood >  8/27 blood >  ANTIBIOTICS: 8/24 azithro > 8/28 8/27 cefepime > 8/29 8/24 ceftriaxne > 8/27  SIGNIFICANT EVENTS:   LINES/TUBES: 8/24 ETT >   DISCUSSION: 56 y/o female s/p cardiac arrest.  Has baseline schizophrenia and COPD.  ASSESSMENT / PLAN:  PULMONARY A: Acute respiratory failure with hypoxemia OSA Acute pulmonary edema P:   Full mechanical vent support VAP prevention Daily WUA/SBT extubate  today  CARDIOVASCULAR A:  Acute Diastolic heart failure Hypertension P:  Tele Continue home torsemide, change to once a day given high BUN   RENAL A:   CKD > rising BUN Hypokalemia P:   Monitor BMET and UOP Replace electrolytes as needed Replace KCl again today   GASTROINTESTINAL A:   Constipation P:   Continue stool softeners Add dulcolax suppositor Continue tube feeding Continue PPI  HEMATOLOGIC A:   Anemia without bleeding P:  Monitor for bleeding Heparin for dvt prophylaxis  INFECTIOUS A:   Fever without clear sign of infection UTI? P:   Monitor off antibiotics If still febrile on 8/31 will look for rare ICU causes of fever with LE ultrasound, RUQ ultrasound  ENDOCRINE A:   DM2   P:   Continue SSI  NEUROLOGIC A:   Baseline schizophrenia Cocaine abuse P:   Stop sedation protocol Extubate today , stop sedation   FAMILY  - Updates: none bedside  - Inter-disciplinary family meet or Palliative Care meeting due by:  day 7  My cc time 34 minutes  Roselie Awkward, MD [place] PCCM Pager: [phone] Cell: [phone] After 3pm or if no response, call [phone]   09/14/2016, 9:05 AM

## 2016-09-15 ENCOUNTER — Encounter (HOSPITAL_COMMUNITY): Payer: Self-pay | Admitting: General Practice

## 2016-09-15 LAB — BASIC METABOLIC PANEL
Anion gap: 15 (ref 5–15)
BUN: 103 mg/dL — ABNORMAL HIGH (ref 6–20)
CO2: 32 mmol/L (ref 22–32)
Calcium: 10.1 mg/dL (ref 8.9–10.3)
Chloride: 102 mmol/L (ref 101–111)
Creatinine, Ser: 2.5 mg/dL — ABNORMAL HIGH (ref 0.44–1.00)
GFR calc Af Amer: 23 mL/min — ABNORMAL LOW (ref >60)
GFR calc non Af Amer: 20 mL/min — ABNORMAL LOW (ref >60)
Glucose, Bld: 92 mg/dL (ref 65–99)
Potassium: 3.7 mmol/L (ref 3.5–5.1)
Sodium: 149 mmol/L — ABNORMAL HIGH (ref 135–145)

## 2016-09-15 LAB — GLUCOSE, CAPILLARY
Glucose-Capillary: 102 mg/dL — ABNORMAL HIGH (ref 65–99)
Glucose-Capillary: 95 mg/dL (ref 65–99)
Glucose-Capillary: 107 mg/dL — ABNORMAL HIGH (ref 65–99)
Glucose-Capillary: 87 mg/dL (ref 65–99)
Glucose-Capillary: 152 mg/dL — ABNORMAL HIGH (ref 65–99)

## 2016-09-15 MED ORDER — MIRTAZAPINE 7.5 MG PO TABS
30.0000 mg | ORAL_TABLET | Freq: Every day | ORAL | Status: DC
  Administered 2016-09-15 – 2016-09-20 (×6): 30 mg via ORAL
  Filled 2016-09-15: qty 2
  Filled 2016-09-15: qty 4
  Filled 2016-09-15: qty 2
  Filled 2016-09-15 (×2): qty 4
  Filled 2016-09-15 (×2): qty 2

## 2016-09-15 MED ORDER — ESCITALOPRAM OXALATE 10 MG PO TABS
20.0000 mg | ORAL_TABLET | Freq: Every day | ORAL | Status: DC
  Administered 2016-09-15 – 2016-09-21 (×7): 20 mg via ORAL
  Filled 2016-09-15 (×2): qty 2
  Filled 2016-09-15 (×2): qty 1
  Filled 2016-09-15: qty 2
  Filled 2016-09-15: qty 1
  Filled 2016-09-15: qty 2

## 2016-09-15 MED ORDER — PANTOPRAZOLE SODIUM 40 MG PO PACK
40.0000 mg | PACK | Freq: Every day | ORAL | Status: DC
  Administered 2016-09-15 – 2016-09-20 (×6): 40 mg via ORAL
  Filled 2016-09-15 (×6): qty 20

## 2016-09-15 MED ORDER — BENZTROPINE MESYLATE 0.5 MG PO TABS
0.5000 mg | ORAL_TABLET | Freq: Every day | ORAL | Status: DC
  Administered 2016-09-15 – 2016-09-21 (×4): 0.5 mg via ORAL
  Filled 2016-09-15 (×7): qty 1

## 2016-09-15 MED ORDER — INSULIN ASPART 100 UNIT/ML ~~LOC~~ SOLN: 0.0000 [IU] | Freq: Three times a day (TID) | SUBCUTANEOUS | Status: DC

## 2016-09-15 MED ORDER — DOCUSATE SODIUM 50 MG/5ML PO LIQD
100.0000 mg | Freq: Two times a day (BID) | ORAL | Status: DC
  Administered 2016-09-15 – 2016-09-21 (×12): 100 mg via ORAL
  Filled 2016-09-15 (×12): qty 10

## 2016-09-15 MED ORDER — VALPROATE SODIUM 250 MG/5ML PO SOLN
250.0000 mg | Freq: Two times a day (BID) | ORAL | Status: AC
  Administered 2016-09-15 – 2016-09-16 (×4): 250 mg via ORAL
  Filled 2016-09-15 (×4): qty 5

## 2016-09-15 NOTE — Progress Notes (Signed)
Placed patient on CPAP for the night via auto-mode with minimum pressure set at 8cm and maximum pressure set at 20cm. Oxygen set at 5lpm with Sp02=96%

## 2016-09-15 NOTE — Progress Notes (Signed)
Nutrition Follow-up  DOCUMENTATION CODES:   Morbid obesity  INTERVENTION:    Continue CHO modified diet to meet nutrition needs  NUTRITION DIAGNOSIS:   Inadequate oral intake related to inability to eat as evidenced by NPO status.  Resolved  GOAL:   Patient will meet greater than or equal to 90% of their needs  Met with current PO intake  MONITOR:   PO intake, Labs, I & O's  ASSESSMENT:   56 y/o female PMHx COPD, CHF, CKD, cocaine abuse, OSA on CPAP, DM, CAD. Recent R tibula/fibula fx s/p repair. 8/23 at PCP was asked to restart torsemide due to volume overload. 8/24 awoke with dyspnea and 02 sat of 60%. Upon EMS Arrival, pt was pulseless, cpr initiated w/ 10-15 minutes PEA. Intubated in ED. Worked up for pulmonary Edema. RD consulted for TF management.   Extubated 8/30. TF off since extubation. Diet advanced to CHO modified. [name] eating well and has a good appetite. Will hold off on supplements at this time. Labs and medications reviewed.   Diet Order:  Diet Carb Modified Fluid consistency: Thin; Room service appropriate? Yes  Skin:  Reviewed, no issues  Last BM:  8/30  Height:   Ht Readings from Last 1 Encounters:  09/08/16 '5\' 4"'  (1.626 m)    Weight:   Wt Readings from Last 1 Encounters:  09/15/16 280 lb 13.9 oz (127.4 kg)    Ideal Body Weight:  54.54 kg  BMI:  Body mass index is 48.21 kg/m.  Estimated Nutritional Needs:   Kcal:  1900-2100  Protein:  90-110 gm  Fluid:  2 L  EDUCATION NEEDS:   No education needs identified at this time  Molli Barrows, [place], [place], [place] Pager [phone] After Hours Pager [phone]

## 2016-09-15 NOTE — Progress Notes (Signed)
PULMONARY / CRITICAL CARE MEDICINE   Name: Tiffany [name] MRN: [id number] DOB: Feb 22, 1960    ADMISSION DATE:  09/08/2016 CONSULTATION DATE:  09/08/16  REFERRING MD:  Ward EDP  CHIEF COMPLAINT:  Cardiac arrest  BRIEF:   56 y/o female with schizophrenia and COPD on 6L O2 at home admitted with cardiac arrest (PEA) on 8/24.  SUBJECTIVE:  Extubated on 8/30 Still net negative  VITAL SIGNS: BP (!) 147/77 (BP Location: Right Leg)   Pulse 86   Temp 98.8 F (37.1 C) (Oral)   Resp (!) 22   Ht 5\' 4"  (1.626 m)   Wt 127.4 kg (280 lb 13.9 oz)   SpO2 98%   BMI 48.21 kg/m   HEMODYNAMICS:    VENTILATOR SETTINGS:    INTAKE / OUTPUT: I/O last 3 completed shifts: In: 1440 [P.O.:480; I.V.:420; NG/GT:540] Out: 3701 [Urine:3700; Stool:1]  PHYSICAL EXAMINATION:  General:  Obese, Resting comfortably in bed HENT: NCAT OP clear PULM: CTA B, normal effort CV: RRR, no mgr GI: BS+, soft, nontender MSK: normal bulk and tone Neuro: awake, alert, no distress, MAEW      LABS:  BMET  Recent Labs Lab 09/13/16 0410 09/14/16 0231 09/15/16 0213  NA 148* 149* 149*  K 3.0* 3.7 3.7  CL 106 105 102  CO2 28 29 32  BUN 77* 103* 103*  CREATININE 2.50* 2.84* 2.50*  GLUCOSE 116* 117* 92    Electrolytes  Recent Labs Lab 09/11/16 0505 09/12/16 0341 09/13/16 0410 09/14/16 0231 09/15/16 0213  CALCIUM 9.1 9.4 9.7 9.8 10.1  MG 2.4 2.6* 2.7*  --   --   PHOS 4.7* 5.6* 5.1*  --   --     CBC  Recent Labs Lab 09/12/16 0341 09/13/16 0410 09/14/16 0231  WBC 13.0* 12.8* 13.3*  HGB 8.1* 7.9* 8.1*  HCT 28.0* 27.3* 27.8*  PLT 416* 479* 498*    Coag's No results for input(s): APTT, INR in the last 168 hours.  Sepsis Markers  Recent Labs Lab 09/08/16 1248 09/09/16 0334 09/09/16 1141 09/09/16 1439 09/10/16 0303  LATICACIDVEN 6.3*  --  1.5 2.6*  --   PROCALCITON  --  2.27  --   --  1.94    ABG  Recent Labs Lab 09/10/16 0340 09/12/16 0220 09/13/16 0410  PHART  7.521* 7.491* 7.514*  PCO2ART 40.1 35.4 34.5  PO2ART 81.1* 90.5 73.1*    Liver Enzymes No results for input(s): AST, ALT, ALKPHOS, BILITOT, ALBUMIN in the last 168 hours.  Cardiac Enzymes No results for input(s): TROPONINI, PROBNP in the last 168 hours.  Glucose  Recent Labs Lab 09/14/16 1108 09/14/16 1627 09/14/16 2004 09/14/16 2338 09/15/16 0317 09/15/16 0737  GLUCAP 137* 137* 94 100* 102* 95    Imaging No results found.   STUDIES:  Echo 8/24 > LVEF 60-65%, RV normal  CULTURES: 8/24 Resp viral panel negative 8/24 resp > GPC pairs, Neg final 8/24 blood >  8/27 blood >  ANTIBIOTICS: 8/24 azithro > 8/28 8/27 cefepime > 8/29 8/24 ceftriaxne > 8/27  SIGNIFICANT EVENTS:   LINES/TUBES: 8/24 ETT > 8/30  DISCUSSION: 56 y/o female s/p cardiac arrest.  Has baseline schizophrenia and COPD. Extubated on 8/30 and has done well.   ASSESSMENT / PLAN:  PULMONARY A: Acute respiratory failure with hypoxemia > resolved OSA Acute pulmonary edema P:   CPAP at night Continue torsemide daily  CARDIOVASCULAR A:  Acute Diastolic heart failure Hypertension Cardiac arrest> due to respiratory arrest (heart failure exacerbation) P:  Tele Continue torsemide daily, not bid  RENAL A:   CKD > rising BUN but Cr better P:   Monitor BMET and UOP Replace electrolytes as needed  GASTROINTESTINAL A:   Constipation > resolved P:   Continue stool softeners Dulcolax prn Continue tube feeding  HEMATOLOGIC A:   Anemia without bleeding P:  Monitor for bleeding CBC prn  INFECTIOUS A:   Fever without clear sign of infection > finally stopped, unclear etiology P:   Continue to monitor off of antibiotics  ENDOCRINE A:   DM2   P:   Continue SSI  NEUROLOGIC A:   Baseline schizophrenia Cocaine abuse Cardiac arrest but doing well from mental standpoint on 8/31 P:   Continue home mood stabilizers and antipsychotics   FAMILY  - Updates: mother updated  bedside 8/31  - Inter-disciplinary family meet or Palliative Care meeting due by:  day 7  Move to [facility], PCCM off  Roselie Awkward, MD [place] PCCM Pager: [phone] Cell: [phone] After 3pm or if no response, call [phone]   09/15/2016, 9:47 AM

## 2016-09-16 DIAGNOSIS — I5023 Acute on chronic systolic (congestive) heart failure: Secondary | ICD-10-CM

## 2016-09-16 LAB — GLUCOSE, CAPILLARY
Glucose-Capillary: 102 mg/dL — ABNORMAL HIGH (ref 65–99)
Glucose-Capillary: 105 mg/dL — ABNORMAL HIGH (ref 65–99)
Glucose-Capillary: 97 mg/dL (ref 65–99)

## 2016-09-16 LAB — BASIC METABOLIC PANEL
Anion gap: 15 (ref 5–15)
BUN: 92 mg/dL — ABNORMAL HIGH (ref 6–20)
CO2: 34 mmol/L — ABNORMAL HIGH (ref 22–32)
Calcium: 10.3 mg/dL (ref 8.9–10.3)
Chloride: 94 mmol/L — ABNORMAL LOW (ref 101–111)
Creatinine, Ser: 2.15 mg/dL — ABNORMAL HIGH (ref 0.44–1.00)
GFR calc Af Amer: 28 mL/min — ABNORMAL LOW (ref >60)
GFR calc non Af Amer: 24 mL/min — ABNORMAL LOW (ref >60)
Glucose, Bld: 99 mg/dL (ref 65–99)
Potassium: 3 mmol/L — ABNORMAL LOW (ref 3.5–5.1)
Sodium: 143 mmol/L (ref 135–145)

## 2016-09-16 LAB — CULTURE, BLOOD (ROUTINE X 2)
Culture: NO GROWTH
Culture: NO GROWTH
Special Requests: ADEQUATE
Special Requests: ADEQUATE

## 2016-09-16 LAB — OCCULT BLOOD X 1 CARD TO LAB, STOOL: Fecal Occult Bld: NEGATIVE

## 2016-09-16 MED ORDER — DIVALPROEX SODIUM ER 500 MG PO TB24
500.0000 mg | ORAL_TABLET | Freq: Every day | ORAL | Status: DC
  Administered 2016-09-17 – 2016-09-19 (×3): 500 mg via ORAL
  Filled 2016-09-16 (×3): qty 1

## 2016-09-16 MED ORDER — IPRATROPIUM-ALBUTEROL 0.5-2.5 (3) MG/3ML IN SOLN: 3.0000 mL | RESPIRATORY_TRACT | Status: DC | PRN

## 2016-09-16 MED ORDER — TIOTROPIUM BROMIDE MONOHYDRATE 18 MCG IN CAPS
18.0000 ug | ORAL_CAPSULE | Freq: Every day | RESPIRATORY_TRACT | Status: DC
  Administered 2016-09-19 – 2016-09-20 (×2): 18 ug via RESPIRATORY_TRACT
  Filled 2016-09-16 (×2): qty 5

## 2016-09-16 MED ORDER — LIDOCAINE 5 % EX PTCH
1.0000 | MEDICATED_PATCH | CUTANEOUS | Status: DC
  Administered 2016-09-16 – 2016-09-21 (×6): 1 via TRANSDERMAL
  Filled 2016-09-16 (×6): qty 1

## 2016-09-16 MED ORDER — POTASSIUM CHLORIDE CRYS ER 20 MEQ PO TBCR
40.0000 meq | EXTENDED_RELEASE_TABLET | Freq: Two times a day (BID) | ORAL | Status: AC
  Administered 2016-09-16 (×2): 40 meq via ORAL
  Filled 2016-09-16 (×2): qty 2

## 2016-09-16 MED ORDER — MONTELUKAST SODIUM 10 MG PO TABS
10.0000 mg | ORAL_TABLET | Freq: Every day | ORAL | Status: DC
  Administered 2016-09-16 – 2016-09-20 (×5): 10 mg via ORAL
  Filled 2016-09-16 (×5): qty 1

## 2016-09-16 MED ORDER — GUAIFENESIN ER 600 MG PO TB12
600.0000 mg | ORAL_TABLET | Freq: Two times a day (BID) | ORAL | Status: DC
  Administered 2016-09-16 – 2016-09-21 (×11): 600 mg via ORAL
  Filled 2016-09-16 (×11): qty 1

## 2016-09-16 MED ORDER — GABAPENTIN 600 MG PO TABS
600.0000 mg | ORAL_TABLET | Freq: Three times a day (TID) | ORAL | Status: DC
  Administered 2016-09-16 – 2016-09-21 (×14): 600 mg via ORAL
  Filled 2016-09-16 (×14): qty 1

## 2016-09-16 NOTE — Evaluation (Signed)
Physical Therapy Evaluation Patient Details Name: Tiffany [name] MRN: [id number] DOB: [birth year] Today's Date: 09/16/2016   History of Present Illness  56 year old female brought in with dyspnea and O2 sats at 60%.   Upon arrival to ED she had agonal respirations and was found to be pulseless. CPR was initiated and continued for approximately 10-15 minutes PEA. She was placed on ventilator post arrest. CXR c/w pulmonary edema.  PMH  significant for COPD on 6L O2, CHF, CKD, cocaine abuse, CAD, OSA on CPAP, morbid obesity , manic depression, h/o seizures and DM. Recently admitted in June for Right sided tib/fib fracture and underwent surgical repair.    Clinical Impression  Patient presents with dependencies in gait and transfers.  Patient currently limited by NWB status on RLE.  Patient unable to maintain NWB during transfer with PT.  Patient does report using a transfer board at home but admits she does still push through her RLE some (but not as much).  It has been 8 weeks since fx - wonder if patient still needs to be NWB?  If no longer needs to be NWB, patient fairly independent with transfers.  If she does still need to be NWB, will need more assistance to ensure safe transfers.  Will benefit from PT to continue to progress mobility and assess safety with transfers and potentially progress gait if able to weight bear.      Follow Up Recommendations Home health PT    Equipment Recommendations  None recommended by PT    Recommendations for Other Services       Precautions / Restrictions Precautions Precautions: Fall Restrictions RLE Weight Bearing: Non weight bearing      Mobility  Bed Mobility               General bed mobility comments: in recliner upon arrival  Transfers Overall transfer level: Needs assistance Equipment used: None Transfers: Stand Pivot Transfers           General transfer comment: able to transfer self with no assistance, but weight bearing on  RLE, even with cueing to not weight bear.  Ambulation/Gait             General Gait Details: unable  Stairs            Wheelchair Mobility    Modified Rankin (Stroke Patients Only)       Balance Overall balance assessment: Needs assistance Sitting-balance support: No upper extremity supported;Feet supported Sitting balance-Leahy Scale: Good     Standing balance support: Bilateral upper extremity supported Standing balance-Leahy Scale: Poor Standing balance comment: reliant on holding onto bed rails and chair for balance                             Pertinent Vitals/Pain Pain Assessment: 0-10 Pain Score: 2  Pain Location: right leg Pain Descriptors / Indicators: Discomfort Pain Intervention(s): Limited activity within patient's tolerance;Monitored during session    Home Living Family/patient expects to be discharged to:: Private residence Living Arrangements: Non-relatives/Friends Available Help at Discharge: Family;Available 24 hours/day Type of Home: House Home Access: Ramped entrance     Home Layout: One level Home Equipment: Grab bars - tub/shower;Cane - single point;Walker - 2 wheels;Bedside commode;Walker - 4 wheels;Wheelchair - manual (sliding board) Additional Comments: home O2-6L    Prior Function Level of Independence: Needs assistance   Gait / Transfers Assistance Needed: used sliding board for bed to/from w/c transfers,  can do independently  ADL's / Homemaking Assistance Needed: family assist with sponge baths        Hand Dominance        Extremity/Trunk Assessment   Upper Extremity Assessment Upper Extremity Assessment: Generalized weakness    Lower Extremity Assessment Lower Extremity Assessment: Generalized weakness       Communication   Communication: No difficulties  Cognition Arousal/Alertness: Awake/alert Behavior During Therapy: WFL for tasks assessed/performed Overall Cognitive Status: Within Functional  Limits for tasks assessed                                        General Comments      Exercises     Assessment/Plan    PT Assessment Patient needs continued PT services  PT Problem List Decreased strength;Decreased activity tolerance;Decreased balance;Decreased mobility;Decreased knowledge of use of DME;Decreased knowledge of precautions;Obesity       PT Treatment Interventions DME instruction;Gait training;Functional mobility training;Therapeutic activities;Therapeutic exercise;Patient/family education    PT Goals (Current goals can be found in the Care Plan section)  Acute Rehab PT Goals Patient Stated Goal: go home, be able to put weight on this leg PT Goal Formulation: With patient Time For Goal Achievement: 09/23/16 Potential to Achieve Goals: Good    Frequency Min 3X/week   Barriers to discharge        Co-evaluation               AM-PAC PT "6 Clicks" Daily Activity  Outcome Measure Difficulty turning over in bed (including adjusting bedclothes, sheets and blankets)?: A Little Difficulty moving from lying on back to sitting on the side of the bed? : A Little Difficulty sitting down on and standing up from a chair with arms (e.g., wheelchair, bedside commode, etc,.)?: A Little Help needed moving to and from a bed to chair (including a wheelchair)?: A Little Help needed walking in hospital room?: Total Help needed climbing 3-5 steps with a railing? : Total 6 Click Score: 14    End of Session Equipment Utilized During Treatment: Other (comment) (cam boot right LE) Activity Tolerance: Patient tolerated treatment well Patient left: in chair;with call bell/phone within reach   PT Visit Diagnosis: Other abnormalities of gait and mobility (R26.89)    Time: 1430-1450 PT Time Calculation (min) (ACUTE ONLY): 20 min   Charges:   PT Evaluation $PT Eval Moderate Complexity: 1 Mod     PT G Codes:        October 01, 2016 [facility],  PT [phone]    Shanna Cisco 2016/10/01, 3:39 PM

## 2016-09-16 NOTE — Progress Notes (Signed)
Family Medicine Teaching Service Daily Progress Note Intern Pager: [phone]  Patient name: Tiffany [name] Medical record number: [id number] Date of birth: [birth year] Age: 56 y.o. Gender: female  Primary Care Provider: [name], MD Consultants: CCM (signed off) Code Status: FULL  Pt Overview and Major Events to Date:  8/24 admitted with PEA arrest, intubated 8/24-8/29 - diuresis with IV lasix and zaroxolyn 8/29 - transitioned to home torsemide   Assessment and Plan:  Acute on Chronic Respiratory failure 2/2 COPD and pulmonary HTN: improving. home O2 requirement is 6L [place] and CPAP at night. Concomitant OSA. S/p 5 day course of azithromycin, ceftriaxone > cefepime. Follows with pulm. Home meds for COPD are albuterol, breo ellipta, singulair, spiriva. Continued cough, but has finished COPD exacerbation treatment. Will give mucinex for cough.  -continue O2 (home is 6L [place], currently on 5L [place]) -mucinex -restart home spirva, duonebs to PRN -continue pulmicort nebs -continuous pulse ox  CHF: echo 09/08/16 with EF 60-65%, normal wall motion. Fluid overloaded on admission, now euvolemic on exam. -continue torsemide daily  AoCKD: Cr baseline appears to be 1-1.5. Acute insult to kidneys on admission likely secondary to hypoperfusion. Slowly improving. Cr 2.15. -BMP in am  Chest soreness: patient complains of left upper chest soreness after coughing. Minimal concern for ACS. Reproducible on exam. -lidocaine patch -continue to monitor  Normocytic Anemia: Hemoglobin 11 03/2016. Drifting down since that time, last Hgb 8.1. Cannot find any recent anemia workup. No signs of bleeding on exam or history. -iron panel with am labs -FOBT  -CBC in am  Hypokalemia: K 3.0. Of note, is on home K of 17meq daily. Consider restarting at discharge if requires additional supplementation. -replete with Kdur 108meq x 2 -recheck BMP in am  Hyperphosphatemia: Phos 5.1 on 8.29. Likely due to acute  phosphate load with CPR and hypoperfusion. Downtrending.  -recheck phos in am  DMII: last a1c 07/05/16 5.6. At home on metformin 500mg  BID. Minimal sliding scale use this admission.  -discontinue moderate sliding scale -daily CBGs with BMP  Schizophrenia: stable. home meds include cogentin 0.5mg , depakote 500mg  QD, remeron 45mg  daily, geodon. -continue cogentin, remeron, lexapro, depakote, geodon  History of cocaine abuse: UDS negative on admission. -monitor as clinically indicated  Diabetic neuropathy: home gabapentin is 600mg  TID.  -restart home gabapentin   Closed fracture of right tibia: last note from Dr. Erlinda Hong suggesting NWB, due for repeat 2view of right tib-fib.  -continue outpatient ortho follow up -of note, was at SNF recently due to this -will have PT reevaluate -PT/OT  FEN/GI: carb mod diet PPx: heparin subq given AKI  Disposition: continued inpatient management of AKI, volume status, and anemia  Subjective:  Patient feels well this morning, eating breakfast with normal WOB. Endorses L upper chest soreness with coughing.   Objective: Temp:  [98 F (36.7 C)-98.8 F (37.1 C)] 98 F (36.7 C) (09/01 0537) Pulse Rate:  [68-86] 68 (09/01 0537) Resp:  [19-24] 20 (09/01 0537) BP: (117-147)/(64-77) 126/72 (09/01 0537) SpO2:  [95 %-100 %] 98 % (09/01 0537) Weight:  [280 lb 6.8 oz (127.2 kg)] 280 lb 6.8 oz (127.2 kg) (08/31 2052) Physical Exam: General: Obese female lying in bed in NAD with [place] in place. Cardiovascular: RRR, no murmur Chest: mildly TTP over L upper chest  Respiratory: distant lung sounds likely due to body habitus, mild diffuse end expiratory wheezing, no crackles, easy WOB Abdomen: soft, protuberant, nontender +BS Extremities: no edema, warm, SCDs in place  Laboratory:  Recent Labs  Lab 09/12/16 0341 09/13/16 0410 09/14/16 0231  WBC 13.0* 12.8* 13.3*  HGB 8.1* 7.9* 8.1*  HCT 28.0* 27.3* 27.8*  PLT 416* 479* 498*    Recent Labs Lab  09/14/16 0231 09/15/16 0213 09/16/16 0550  NA 149* 149* 143  K 3.7 3.7 3.0*  CL 105 102 94*  CO2 29 32 34*  BUN 103* 103* 92*  CREATININE 2.84* 2.50* 2.15*  CALCIUM 9.8 10.1 10.3  GLUCOSE 117* 92 99    Imaging/Diagnostic Tests: No new imaging studies.  Sela Hilding, MD 09/16/2016, 7:26 AM PGY-2, [place] Intern pager: [phone], text pages welcome

## 2016-09-17 LAB — URINALYSIS, ROUTINE W REFLEX MICROSCOPIC
Bacteria, UA: NONE SEEN
Bilirubin Urine: NEGATIVE
Glucose, UA: NEGATIVE mg/dL
Hgb urine dipstick: NEGATIVE
Ketones, ur: NEGATIVE mg/dL
Nitrite: NEGATIVE
Protein, ur: NEGATIVE mg/dL
Specific Gravity, Urine: 1.013 (ref 1.005–1.030)
pH: 5 (ref 5.0–8.0)

## 2016-09-17 LAB — BLOOD GAS, ARTERIAL
Acid-Base Excess: 8.3 mmol/L — ABNORMAL HIGH (ref 0.0–2.0)
Bicarbonate: 34.1 mmol/L — ABNORMAL HIGH (ref 20.0–28.0)
Drawn by: 358491
FIO2: 40
O2 Saturation: 96.5 %
Patient temperature: 98.6
pCO2 arterial: 65.2 mmHg (ref 32.0–48.0)
pH, Arterial: 7.338 — ABNORMAL LOW (ref 7.350–7.450)
pO2, Arterial: 96.2 mmHg (ref 83.0–108.0)

## 2016-09-17 LAB — BASIC METABOLIC PANEL
Anion gap: 14 (ref 5–15)
BUN: 96 mg/dL — ABNORMAL HIGH (ref 6–20)
CO2: 32 mmol/L (ref 22–32)
Calcium: 10.2 mg/dL (ref 8.9–10.3)
Chloride: 94 mmol/L — ABNORMAL LOW (ref 101–111)
Creatinine, Ser: 2.24 mg/dL — ABNORMAL HIGH (ref 0.44–1.00)
GFR calc Af Amer: 27 mL/min — ABNORMAL LOW (ref >60)
GFR calc non Af Amer: 23 mL/min — ABNORMAL LOW (ref >60)
Glucose, Bld: 106 mg/dL — ABNORMAL HIGH (ref 65–99)
Potassium: 3.7 mmol/L (ref 3.5–5.1)
Sodium: 140 mmol/L (ref 135–145)

## 2016-09-17 LAB — FERRITIN: Ferritin: 63 ng/mL (ref 11–307)

## 2016-09-17 LAB — IRON AND TIBC
Iron: 43 ug/dL (ref 28–170)
Saturation Ratios: 9 % — ABNORMAL LOW (ref 10.4–31.8)
TIBC: 461 ug/dL — ABNORMAL HIGH (ref 250–450)
UIBC: 418 ug/dL

## 2016-09-17 LAB — GLUCOSE, CAPILLARY: Glucose-Capillary: 103 mg/dL — ABNORMAL HIGH (ref 65–99)

## 2016-09-17 LAB — PHOSPHORUS: Phosphorus: 5.4 mg/dL — ABNORMAL HIGH (ref 2.5–4.6)

## 2016-09-17 NOTE — Consult Note (Signed)
Renal Service Consult Note [facility]  Tiffany [name] 09/17/2016 [name] Requesting Physician:  Dr Gwendlyn Deutscher  Reason for Consult:  Renal failure HPI: The patient is a 56 y.o. year-old with hx of morbid obesity, OSA/ OHS, COPD on home O2, chron resp failure, CHF diast, CKD 3, GERD, MI, bipolar d/o, schizophrenia chronic, HL, DM2, HTN and CVA presented on 8/24 with dyspnea , brought to ED by EMS and has PEA arrest in ED.  10-15 min of CPR and pt had ROSC. Admitted to ICU on vent w dx of pulm edema/ chronic COPD.  Diuresed w IV lasix and creat went from 1.0 on admission up to peak of 2.8 about 3-4 days ago. IV lasix was dc'd and started on po torsemide 60 bid then decreased to 60 po daily 2 days ago.  Creat improved down to 2.1 yest and 2.2 today. Asked to see for A/c renal failure.     Patient w/o complaints, cheerful mood, [place] O2, no sig SOB or cough, no abd pain or n/v/d. Takes nsaids at home , naprosyn bid for DJD.    Old chart: 2012 -Asthma exac, OSA, htn, dm 2, prob chf , mo 2013 -Recurrent syncope multifact, polypharm/ OSA/ poss arrhythmia. EEG neg 2014 -Recurrent seizures vs movement disorder. 10 year hist. Prob nocturnal hypoxic convulsion d/t OSA. On hospital did not have sz activity. MRI neg. neurontin ^'d dose, dc home.  2016 -acute resp failure/ SOB, improved with nebs/ O2, CXRneg, neg trop. Rx as COPD exac.   2017 -acute/ chron resp failure/ severe CAP > large L PNA, ^WBC, SIRS picture. rx bipap/ abx. CO2 retention, improved. Dc' d on po abx/ pred.  -SOB/ hypoxia > COPD exac. DC'd on pred/ nebs/ lasix for prob CHF -admit for crack cocaine use and CP ; went to inpatient psych after stabilized medically. No MI, CXR neg. -same inpt psych admit as above; depressoin/ mood instability and cocaine dependence 2018 -march > COPD on 6L [place] at home w acute exac and/or CHF . Rx with IV lasix high dose + metolazone and diuresed well.  ABG showed CO2 retention.  ECHO  noted pulm HTN.   -April > gen weaknees due to hypokalemia. Rx'd with IV/ Po KCl. AKI.  -April > acute cholecystitis, +HIDA filing defect. ABx given. Not surg candidate.  IR placed GB drain, left in place at dc.  Rx IV and po abx.  DC'd.  Bipolar + paranoid schizophrenia, rx with geodon, remeron, lexapro, depakote.  -July > fall and R tib/ fib fracture , seen by ortho and had IM nail w/ regional anesthesia. GB drain replaced by IR.  IV diuresis due to vol overload was required.      ROS  denies CP  no joint pain   no HA  no blurry vision  no rash  no diarrhea  no nausea/ vomiting  no dysuria  no difficulty voiding  no change in urine color    Past Medical History  Past Medical History:  Diagnosis Date  . Anxiety   . Arthritis    "all over" (04/10/2016)  . Asthma   . Cardiac arrest ([place]) 09/08/2016   PEA  . CHF (congestive heart failure) ([place])   . Chronic bronchitis ([place])   . Chronic kidney disease    "I see a kidney dr." (04/10/2016)  . Cocaine abuse   . Complication of anesthesia    decreased bp, decreased heart rate  . Depression   . Disorder of nervous  system   . Emphysema   . GERD (gastroesophageal reflux disease)   . Heart attack ([place]) 1980s  . History of blood transfusion 1994   "couldn't stop bleeding from my period"  . Hyperlipidemia LDL goal <70   . Hypertension   . Incontinence   . Manic depression ([place])   . On home oxygen therapy    "6L; 24/7" (04/10/2016)  . OSA on CPAP    "wear mask sometimes" (04/10/2016)  . Paranoid ([place])    "sometimes; I'm on RX for it" (04/10/2016)  . Pneumonia    "I've had it several times; haven't had it since 06/2015" (04/10/2016)  . Schizophrenia ([place])   . Seasonal allergies   . Seizures ([place])    "don't know what kind; last one was ~ 1 yr ago" (04/10/2016)  . Sinus trouble   . Stroke [facility]) 1980s   denies residual on 04/10/2016  . Type II diabetes mellitus ([place])    Past Surgical History  Past Surgical History:  Procedure  Laterality Date  . CESAREAN SECTION  1997  . HERNIA REPAIR    . IR CHOLANGIOGRAM EXISTING TUBE  07/20/2016  . IR PERC CHOLECYSTOSTOMY  05/10/2016  . IR RADIOLOGIST EVAL & MGMT  06/08/2016  . IR SINUS/FIST TUBE CHK-NON GI  07/12/2016  . TIBIA IM NAIL INSERTION Right 07/12/2016   Procedure: INTRAMEDULLARY (IM) NAIL RIGHT TIBIA;  Surgeon: Leandrew Koyanagi, MD;  Location: [facility];  Service: Orthopedics;  Laterality: Right;  . UMBILICAL HERNIA REPAIR  ~ 1963   "that's why I don't have a belly button"  . VAGINAL HYSTERECTOMY     Family History  Family History  Problem Relation Age of Onset  . Cancer Father        prostate  . Cancer Mother        lung  . Depression Mother   . Depression Sister   . Anxiety disorder Sister   . Schizophrenia Sister   . Bipolar disorder Sister   . Depression Sister   . Depression Brother   . Heart failure Unknown        cousin   Social History  reports that she quit smoking about 5 months ago. Her smoking use included Cigarettes. She started smoking about 39 years ago. She has a 57.00 pack-year smoking history. She has never used smokeless tobacco. She reports that she does not drink alcohol or use drugs. Allergies  Allergies  Allergen Reactions  . Hydroxyzine Shortness Of Breath    -throat closed up  . Latuda [Lurasidone Hcl] Anaphylaxis  . Codeine Nausea And Vomiting  . Sulfa Antibiotics Itching   Home medications Prior to Admission medications   Medication Sig Start Date End Date Taking? Authorizing Provider  amLODipine (NORVASC) 5 MG tablet Take 1 tablet (5 mg total) by mouth daily. 06/13/16  Yes Archie Patten, MD  divalproex ([name] ER) 500 MG 24 hr tablet Take 2 tablets (1,000 mg total) by mouth at bedtime. Patient taking differently: Take 1,000 mg by mouth every morning.  06/29/16 06/29/17 Yes Arfeen, Arlyce Harman, MD  escitalopram (LEXAPRO) 20 MG tablet Take 1 tablet (20 mg total) by mouth daily every morning 08/31/16  Yes Arfeen, Arlyce Harman, MD  mirtazapine  (REMERON) 30 MG tablet Take 30 mg by mouth at bedtime. 08/18/16  Yes [provider]  tiotropium (SPIRIVA HANDIHALER) 18 MCG inhalation capsule Place 1 capsule (18 mcg total) into inhaler and inhale daily. 06/13/16  Yes Archie Patten, MD  ziprasidone (  GEODON) 80 MG capsule Take 1 capsule (80 mg total) by mouth at bedtime. 08/31/16  Yes Arfeen, Arlyce Harman, MD  albuterol (PROVENTIL) (2.5 MG/3ML) 0.083% nebulizer solution Take 3 mLs (2.5 mg total) by nebulization every 6 (six) hours as needed for wheezing or shortness of breath. 06/13/16   Archie Patten, MD  Alcohol Swabs (B-D SINGLE USE SWABS REGULAR) PADS CHECK CAPILLARY BLOOD GLUCOSE ONE TIME DAILY 02/03/16   Archie Patten, MD  aspirin 81 MG EC tablet Take 1 tablet (81 mg total) by mouth daily. Swallow whole. 06/13/16   Archie Patten, MD  atorvastatin (LIPITOR) 40 MG tablet Take 1 tablet (40 mg total) by mouth daily at bedtime 07/27/16   Mikell, Jeani Sow, MD  benztropine (COGENTIN) 0.5 MG tablet Take 1 tablet (0.5 mg total) by mouth daily. 07/20/16 07/20/17  Verner Mould, MD  Blood Glucose Monitoring Suppl (ACCU-CHEK AVIVA PLUS) w/Device KIT CHECK BLOOD SUGAR THREE TIMES DAILY 12/02/15   Archie Patten, MD  fluticasone furoate-vilanterol (BREO ELLIPTA) 100-25 MCG/INH AEPB INHALE 1 PUFF EVERY DAY 09/14/16   Mayo, Pete Pelt, MD  gabapentin (NEURONTIN) 600 MG tablet Take 1 tablet (600 mg total) by mouth 3 (three) times daily. 06/28/16   Archie Patten, MD  glucose blood test strip Use to check blood sugar 3 times daily. 12/05/15   Pucilowska, Herma Ard B, MD  metFORMIN (GLUCOPHAGE-XR) 500 MG 24 hr tablet Take 1 tablet (500 mg total) by mouth 2 (two) times daily. 06/28/16   Archie Patten, MD  mirtazapine (REMERON) 45 MG tablet Take 1 tablet (45 mg total) by mouth at bedtime. Patient not taking: Reported on 09/08/2016 06/29/16   Arfeen, Arlyce Harman, MD  montelukast (SINGULAIR) 10 MG tablet Take 1 tablet (10 mg total) by mouth at  bedtime. 06/13/16   Archie Patten, MD  Multiple Vitamin (MULTIVITAMIN WITH MINERALS) TABS tablet Take 1 tablet by mouth daily.    [provider]  naproxen (NAPROXEN DR) 500 MG EC tablet Take 1 tablet (500 mg total) by mouth 2 (two) times daily with a meal. 09/07/16   Mikell, Jeani Sow, MD  omeprazole (PRILOSEC) 40 MG capsule Take 1 capsule (40 mg total) by mouth 2 (two) times daily. 08/23/16   Mikell, Jeani Sow, MD  OXYGEN Inhale 6 L into the lungs daily.    [provider]  potassium chloride SA (K-DUR,KLOR-CON) 20 MEQ tablet Take 2.5 tablets (50 mEq total) by mouth 2 (two) times daily. 07/04/16   Bhagat, Crista Luria, PA  torsemide (DEMADEX) 20 MG tablet Take 2 tablets (40 mg total) by mouth 2 (two) times daily. Patient taking differently: Take 60 mg by mouth 2 (two) times daily.  07/20/16 10/18/16  Verner Mould, MD  VENTOLIN HFA 108 [phone] Base) MCG/ACT inhaler Inhale 2 puffs into the lungs every 6 (six) hours as needed for wheezing or shortness of breath.  06/01/16   [provider]   Liver Function Tests No results for input(s): AST, ALT, ALKPHOS, BILITOT, PROT, ALBUMIN in the last 168 hours. No results for input(s): LIPASE, AMYLASE in the last 168 hours. CBC  Recent Labs Lab 09/12/16 0341 09/13/16 0410 09/14/16 0231  WBC 13.0* 12.8* 13.3*  NEUTROABS  --   --  9.1*  HGB 8.1* 7.9* 8.1*  HCT 28.0* 27.3* 27.8*  MCV 95.9 95.5 98.2  PLT 416* 479* 094*   Basic Metabolic Panel  Recent Labs Lab 09/11/16 0505 09/12/16 0341 09/13/16 0410 09/14/16 0231 09/15/16 0213 09/16/16  0550 09/17/16 0417  NA 147* 146* 148* 149* 149* 143 140  K 4.0 3.2* 3.0* 3.7 3.7 3.0* 3.7  CL 110 104 106 105 102 94* 94*  CO2 '26 26 28 29 ' 32 34* 32  GLUCOSE 110* 113* 116* 117* 92 99 106*  BUN 46* 59* 77* 103* 103* 92* 96*  CREATININE 2.13* 2.43* 2.50* 2.84* 2.50* 2.15* 2.24*  CALCIUM 9.1 9.4 9.7 9.8 10.1 10.3 10.2  PHOS 4.7* 5.6* 5.1*  --   --   --  5.4*    Iron/TIBC/Ferritin/ %Sat    Component Value Date/Time   IRON 43 09/17/2016 0417   TIBC 461 (H) 09/17/2016 0417   FERRITIN 63 09/17/2016 0417   IRONPCTSAT 9 (L) 09/17/2016 0417    Vitals:   09/17/16 0527 09/17/16 0756 09/17/16 0915 09/17/16 0958  BP: 109/69 (!) 97/59    Pulse: 74 81  76  Resp: 18 18  (!) 21  Temp: 98.1 F (36.7 C) 97.8 F (36.6 C)    TempSrc: Oral Axillary    SpO2: 93% 100% 98% 94%  Weight:      Height:       Exam Gen obese AAF, not in distress, calm at 40 deg in bed No rash, cyanosis or gangrene Sclera anicteric, throat clear  No jvd or bruits Chest clear bilat, dec'd at bases RRR no MRG Abd soft ntnd no mass or ascites +bs obese GU deferred MS no joint effusions or deformity Ext no LE / hip or UE edema / no wounds or ulcers Neuro is alert, Ox 3 , nf, +asterixis   Date    Creat  eGFR 2012- 2013  0.8- 1.0 2014- 2016  1.0- 1.5 2017   1.0- 1.68 Mar 2018  1.2- 1.21 April 2016  1.1- 2.0 June- July 2018 1.1- 1.48 46- 59  Aug 6   1.07  67 Aug 23   0.94 Aug 28   2.43  24 Aug 30   2.84  20 Sept 2   2.24  27     UA 8/24 > >300 protein, 6-30 wbc/ rbc, no epis   ECHO 8/24 > LV normal, ^'d filling pressures  CXR 8/27 > +pulm edema CXR 8/30 > +persistent pulm edema  I/O's = 14L in and 28L out since admit 8/24 Wts' 140 > 130 kg yest. Lowest was 126kg 3 days ago.     Impression: 1. Acute on CKD3 - baseline creat 1- 1.5.  Here with arrest and acute CHF (diast).  Diuresed with sig rise in creat, improved with decrease in diuretics. Down 10 kg from admission.  Off the vent and on nasal cannula , no edema on exam/ lungs clear, CXR improved.  Would keep on current po diuretics and see how she does.  May have new baseline creat vs ATN from arrest/ shock.  Will repeat UA and get renal US.  Not good dialysis candidate due to sig comorbidities.   2. COPD - on 6L O2 at home 3. OSA 4. Morbid obesity 5. SP PEA arrest 6. Diast CHF 7. Hx acute  cholecystitis - sp chole tube 8. Bipolar/ chronic schizophrenia    Plan - as above  Kelly Splinter MD [employer] pager [phone]   09/17/2016, 1:42 PM

## 2016-09-17 NOTE — Progress Notes (Addendum)
OT Cancellation    09/17/16 0747  OT Visit Information  Last OT Received On 09/17/16  Reason Eval/Treat Not Completed Fatigue/lethargy limiting ability to participate. Pt too lethargic to participate in OT evaluation. Attempted to increase arousal/alertness with sternal rubs and elevating HOB; pt would open her eyes for a few seconds and then fall asleep. Attempted grooming task with cold washcloth to increase pt arousal, and pt would fall asleep during task. Notified RN. Will return as schedule allows. Thank you.   [place], OTR/L Acute Rehab Pager: [phone] Office: [phone]

## 2016-09-17 NOTE — Progress Notes (Signed)
Patient too lethargic for PO medication at this time.

## 2016-09-17 NOTE — Progress Notes (Signed)
PT Cancellation Note  Patient Details Name: Tiffany [name] MRN: [id number] DOB: [birth year]   Cancelled Treatment:    Reason Eval/Treat Not Completed: Medical issues which prohibited therapy. Pt on bipap.   Shary Decamp [place] 09/17/2016, 10:17 AM [place]

## 2016-09-17 NOTE — Progress Notes (Signed)
Family Medicine Teaching Service Daily Progress Note Intern Pager: [phone]  Patient name: Tiffany [name] Medical record number: [id number] Date of birth: 08-17-1960 Age: 56 y.o. Gender: female  Primary Care Provider: [name], MD Consultants: CCM (signed off) Code Status: FULL  Pt Overview and Major Events to Date:  8/24 admitted with PEA arrest, intubated 8/24-8/29 - diuresis with IV lasix and zaroxolyn 8/29 - transitioned to home torsemide 9/1 transferred to [place] 9/2 Cr rising, nephro consulted  Assessment and Plan:  Acute on Chronic Respiratory failure 2/2 COPD and pulmonary HTN: Worsening. home O2 requirement is 6L [place] and CPAP at night. Concomitant OSA. S/p 5 day course of azithromycin, ceftriaxone > cefepime. Follows with pulm. Home meds for COPD are albuterol, breo ellipta, singulair, spiriva. Continued cough, but has finished COPD exacerbation treatment. Will give mucinex for cough.  -transition to BiPAP based on ABG with CO2 retention -transfer to stepdown -mucinex -restart home spirva, duonebs to PRN -continue pulmicort nebs -continuous pulse ox  CHF: echo 09/08/16 with EF 60-65%, normal wall motion. Fluid overloaded on admission, now euvolemic on exam. -continue torsemide daily  AoCKD: Cr baseline appears to be 1-1.5. Acute insult to kidneys on admission likely secondary to hypoperfusion. Cr 2.15 >2.24 with decreased UOP.  -BMP in am -consulted nephrology, appreciate recs  Chest soreness: patient complains of left upper chest soreness after coughing. Minimal concern for ACS. Reproducible on exam. -lidocaine patch -continue to monitor  Normocytic Anemia: Hemoglobin 11 03/2016. Drifting down since that time, last Hgb 8.1. Cannot find any recent anemia workup. No signs of bleeding on exam or history. Iron panel consistent with ACD.  -FOBT  -CBC in am  Hypokalemia, resolved: K 3.7. Of note, is on home K of 72meq daily. Consider restarting at discharge if  requires additional supplementation. -recheck BMP in am  Hyperphosphatemia: Phos 5.1 on 8.29 > 5.4. Likely due to acute phosphate load with CPR and hypoperfusion. May be related to AoCKD.  -recheck phos in am  DMII: last a1c 07/05/16 5.6. At home on metformin 500mg  BID. Minimal sliding scale use this admission.  -discontinue moderate sliding scale -daily CBGs with BMP  Schizophrenia: stable. home meds include cogentin 0.5mg , depakote 500mg  QD, remeron 45mg  daily, geodon. -continue cogentin, remeron, lexapro, depakote, geodon  History of cocaine abuse: UDS negative on admission. -monitor as clinically indicated  Diabetic neuropathy: home gabapentin is 600mg  TID.  -restart home gabapentin   Closed fracture of right tibia: last note from Dr. Erlinda Hong suggesting NWB, due for repeat 2view of right tib-fib.  -continue outpatient ortho follow up -of note, was at SNF recently due to this -will have PT reevaluate -PT/OT  FEN/GI: carb mod diet PPx: heparin subq given AKI  Disposition: continued inpatient management of AKI, respiratory status, volume status, and anemia  Subjective:  Patient intermittently responsive to vigorous stim. No increased WOB. Denies pain.   Objective: Temp:  [98.1 F (36.7 C)-98.8 F (37.1 C)] 98.1 F (36.7 C) (09/02 0527) Pulse Rate:  [69-74] 74 (09/02 0527) Resp:  [16-20] 18 (09/02 0527) BP: (93-112)/(54-69) 109/69 (09/02 0527) SpO2:  [93 %-100 %] 93 % (09/02 0527) Weight:  [288 lb 2.3 oz (130.7 kg)] 288 lb 2.3 oz (130.7 kg) (09/01 2122) Physical Exam: General: Obese female lying in bed in NAD with [place] in place, sleeping.  Cardiovascular: RRR, no murmur Chest: mildly TTP over L upper chest  Respiratory: distant lung sounds likely due to body habitus, mild diffuse end expiratory wheezing, no crackles, easy WOB  Abdomen: soft, protuberant, nontender +BS Extremities: no edema, warm, SCDs in place  Laboratory:  Recent Labs Lab 09/12/16 0341 09/13/16 0410  09/14/16 0231  WBC 13.0* 12.8* 13.3*  HGB 8.1* 7.9* 8.1*  HCT 28.0* 27.3* 27.8*  PLT 416* 479* 498*    Recent Labs Lab 09/15/16 0213 09/16/16 0550 09/17/16 0417  NA 149* 143 140  K 3.7 3.0* 3.7  CL 102 94* 94*  CO2 32 34* 32  BUN 103* 92* 96*  CREATININE 2.50* 2.15* 2.24*  CALCIUM 10.1 10.3 10.2  GLUCOSE 92 99 106*   ABG    Component Value Date/Time   PHART 7.338 (L) 09/17/2016 0830   PCO2ART 65.2 (HH) 09/17/2016 0830   PO2ART 96.2 09/17/2016 0830   HCO3 34.1 (H) 09/17/2016 0830   TCO2 33 (H) 09/08/2016 0652   ACIDBASEDEF 0.5 06/24/2015 1510   O2SAT 96.5 09/17/2016 0830    Imaging/Diagnostic Tests: No new imaging studies.  Sela Hilding, MD 09/17/2016, 6:57 AM PGY-2, [place] Intern pager: [phone], text pages welcome

## 2016-09-17 NOTE — Evaluation (Signed)
Occupational Therapy Evaluation Patient Details Name: Tiffany [name] MRN: [id number] DOB: Apr 14, 1960 Today's Date: 09/17/2016    History of Present Illness 56 year old female brought in with dyspnea and O2 sats at 60%.   Upon arrival to ED she had agonal respirations and was found to be pulseless. CPR was initiated and continued for approximately 10-15 minutes PEA. She was placed on ventilator post arrest. CXR c/w pulmonary edema.  PMH  significant for COPD on 6L O2, CHF, CKD, cocaine abuse, CAD, OSA on CPAP, morbid obesity , manic depression, h/o seizures and DM. Recently admitted in June for Right sided tib/fib fracture and underwent surgical repair.     Clinical Impression   PTA, pt was living with her son and requiring assistance for ADLs from pt's son and mother. Pt currently requiring Max A for bathing, dressing, and toileting. Pt performed stand pivot transfer to [facility] with Min A. However, she was unable to maintain NWB status. Pt would benefit form further acute OT to increase pt safety and independence with ADLs and functional mobility. Pt would benefit form HHOT once WB status changes to optimize safety during ADLs and functional mobility at home.     Follow Up Recommendations  Supervision/Assistance - 24 hour;No OT follow up (Pending pt progress)   Equipment Recommendations  None recommended by OT    Recommendations for Other Services       Precautions / Restrictions Precautions Precautions: Fall Restrictions Weight Bearing Restrictions: Yes RLE Weight Bearing: Non weight bearing Other Position/Activity Restrictions: Pt performing more TDWB during stand pivot transfers.       Mobility Bed Mobility               General bed mobility comments: in recliner upon arrival  Transfers Overall transfer level: Needs assistance Equipment used: None Transfers: Stand Pivot Transfers   Stand pivot transfers: Min assist       General transfer comment: Min A to control  descent into recliner. Pt able to transfer self with no assistance, but weight bearing on RLE, even with cueing to not weight bear.    Balance Overall balance assessment: Needs assistance Sitting-balance support: No upper extremity supported;Feet supported Sitting balance-Leahy Scale: Good     Standing balance support: Bilateral upper extremity supported Standing balance-Leahy Scale: Poor Standing balance comment: reliant on holding onto bed rails and chair for balance                           ADL either performed or assessed with clinical judgement   ADL Overall ADL's : Needs assistance/impaired Eating/Feeding: Set up;Sitting   Grooming: Set up;Sitting   Upper Body Bathing: Moderate assistance;With caregiver independent assisting;Sitting   Lower Body Bathing: Maximal assistance;Sit to/from stand   Upper Body Dressing : Moderate assistance;Sitting   Lower Body Dressing: Maximal assistance;Sit to/from stand   Toilet Transfer: Stand-pivot;Min Art therapist Details (indicate cue type and reason): Able to stand pivot from recliner to [facility] with [employer] A for safety. However, unable to maintain NWB status.          Functional mobility during ADLs: Minimal assistance (Stand-pivot) General ADL Comments: Pt demonstrating decreased functional performance. However, seems to perform near baseline function with need for assistance for toileting, dressing, and bathing. Feel pt's occupational performance and participation would improve once WB status changes. Pt unabel to maintain WB status during ADLs and stand pivot transfers.     Vision Baseline Vision/History: Wears glasses  Wears Glasses: At all times Patient Visual Report: No change from baseline       Perception     Praxis      Pertinent Vitals/Pain Pain Assessment: Faces Faces Pain Scale: Hurts little more Pain Location: right leg Pain Descriptors / Indicators: Discomfort Pain Intervention(s):  Monitored during session;Limited activity within patient's tolerance;Repositioned     Hand Dominance Right   Extremity/Trunk Assessment Upper Extremity Assessment Upper Extremity Assessment: Generalized weakness   Lower Extremity Assessment Lower Extremity Assessment: Generalized weakness;RLE deficits/detail RLE Deficits / Details: Right sided tib/fib fracture and underwent surgical repair.  NWB  RLE: Unable to fully assess due to immobilization RLE Coordination: decreased fine motor;decreased gross motor   Cervical / Trunk Assessment Cervical / Trunk Assessment: Other exceptions Cervical / Trunk Exceptions: Increased body habitus   Communication Communication Communication: No difficulties   Cognition Arousal/Alertness: Awake/alert Behavior During Therapy: WFL for tasks assessed/performed Overall Cognitive Status: Within Functional Limits for tasks assessed                                     General Comments  Pt son present thorughout session    Exercises     Shoulder Instructions      Home Living Family/patient expects to be discharged to:: Private residence Living Arrangements: Children (Son) Available Help at Discharge: Family;Available 24 hours/day Type of Home: House Home Access: Ramped entrance     Home Layout: One level     Bathroom Shower/Tub: Teacher, early years/pre: Standard     Home Equipment: Grab bars - tub/shower;Cane - single point;Walker - 2 wheels;Bedside commode;Walker - 4 wheels;Wheelchair - manual (sliding board)   Additional Comments: home O2-6L      Prior Functioning/Environment Level of Independence: Needs assistance  Gait / Transfers Assistance Needed: used sliding board for bed to/from w/c transfers, can do independently ADL's / Homemaking Assistance Needed: family assist with sponge baths and dressing   Comments: Son and mother assist with ADLs        OT Problem List: Decreased strength;Decreased  range of motion;Decreased activity tolerance;Impaired balance (sitting and/or standing);Decreased safety awareness;Decreased knowledge of use of DME or AE;Decreased knowledge of precautions;Pain;Obesity      OT Treatment/Interventions: Self-care/ADL training;Therapeutic exercise;Energy conservation;DME and/or AE instruction;Therapeutic activities;Patient/family education    OT Goals(Current goals can be found in the care plan section) Acute Rehab OT Goals Patient Stated Goal: go home, be able to put weight on this leg OT Goal Formulation: With patient Time For Goal Achievement: 10/01/16 Potential to Achieve Goals: Good ADL Goals Pt Will Perform Upper Body Dressing: with set-up;with supervision;sitting Pt Will Perform Lower Body Dressing: with min assist;with adaptive equipment;sit to/from stand Pt Will Transfer to Toilet: with min guard assist;stand pivot transfer;bedside commode (adhering to WB status) Pt Will Perform Toileting - Clothing Manipulation and hygiene: with min assist;with adaptive equipment;sit to/from stand  OT Frequency: Min 2X/week   Barriers to D/C:            Co-evaluation              AM-PAC PT "6 Clicks" Daily Activity     Outcome Measure Help from another person eating meals?: None Help from another person taking care of personal grooming?: None Help from another person toileting, which includes using toliet, bedpan, or urinal?: A Lot Help from another person bathing (including washing, rinsing, drying)?: A Lot Help from another person  to put on and taking off regular upper body clothing?: A Lot Help from another person to put on and taking off regular lower body clothing?: A Lot 6 Click Score: 16   End of Session Equipment Utilized During Treatment: Oxygen (4L) Nurse Communication: Mobility status  Activity Tolerance: Patient tolerated treatment well Patient left: in chair;with call bell/phone within reach;with family/visitor present  OT Visit  Diagnosis: Unsteadiness on feet (R26.81);Other abnormalities of gait and mobility (R26.89);Muscle weakness (generalized) (M62.81);Pain Pain - Right/Left: Right Pain - part of body: Leg                Time: 2500-3704 OT Time Calculation (min): 24 min Charges:  OT General Charges $OT Visit: 1 Visit OT Evaluation $OT Eval Moderate Complexity: 1 Mod OT Treatments $Self Care/Home Management : 8-22 mins G-Codes:     Dreydon Cardenas MSOT, OTR/L Acute Rehab Pager: [phone] Office: [place] 09/17/2016, 4:13 PM

## 2016-09-17 NOTE — Progress Notes (Signed)
FPTS Interim Progress Note  S: Late Entry: patient re-evaluated at 11:25AM  She has been on Bipap for 1 hr and 25 mins approximately. [name] awake and alert without any respiratory distress.   O: BP (!) 97/59 (BP Location: Left Arm)   Pulse 76   Temp 97.8 F (36.6 C) (Axillary)   Resp (!) 21   Ht 5\' 4"  (1.626 m)   Wt 288 lb 2.3 oz (130.7 kg)   SpO2 94%   BMI 49.46 kg/m   GEN: NAD, obese CV: RRR, no murmurs, rubs, or gallops PULM: CTAB, normal effort PSYCH: Mood and affect euthymic, normal rate and volume of speech NEURO: Awake, alert, no focal deficits grossly, normal speech   A/P: Trial off Bipap. If still okay, will discontinue transfer to step down.  Will switch from CPAP to BiPap qhs   Smiley Houseman, MD 09/17/2016, 1:28 PM PGY-3, [place] Medicine Service pager [phone]

## 2016-09-18 ENCOUNTER — Inpatient Hospital Stay (HOSPITAL_COMMUNITY): Payer: Medicare HMO

## 2016-09-18 LAB — GLUCOSE, CAPILLARY
Glucose-Capillary: 92 mg/dL (ref 65–99)
Glucose-Capillary: 114 mg/dL — ABNORMAL HIGH (ref 65–99)
Glucose-Capillary: 124 mg/dL — ABNORMAL HIGH (ref 65–99)

## 2016-09-18 LAB — BASIC METABOLIC PANEL
Anion gap: 11 (ref 5–15)
BUN: 89 mg/dL — ABNORMAL HIGH (ref 6–20)
CO2: 35 mmol/L — ABNORMAL HIGH (ref 22–32)
Calcium: 9.8 mg/dL (ref 8.9–10.3)
Chloride: 96 mmol/L — ABNORMAL LOW (ref 101–111)
Creatinine, Ser: 2.12 mg/dL — ABNORMAL HIGH (ref 0.44–1.00)
GFR calc Af Amer: 29 mL/min — ABNORMAL LOW (ref >60)
GFR calc non Af Amer: 25 mL/min — ABNORMAL LOW (ref >60)
Glucose, Bld: 106 mg/dL — ABNORMAL HIGH (ref 65–99)
Potassium: 3 mmol/L — ABNORMAL LOW (ref 3.5–5.1)
Sodium: 142 mmol/L (ref 135–145)

## 2016-09-18 LAB — CBC
HCT: 28.7 % — ABNORMAL LOW (ref 36.0–46.0)
Hemoglobin: 8.4 g/dL — ABNORMAL LOW (ref 12.0–15.0)
MCH: 27.6 pg (ref 26.0–34.0)
MCHC: 29.3 g/dL — ABNORMAL LOW (ref 30.0–36.0)
MCV: 94.4 fL (ref 78.0–100.0)
Platelets: 620 10*3/uL — ABNORMAL HIGH (ref 150–400)
RBC: 3.04 MIL/uL — ABNORMAL LOW (ref 3.87–5.11)
RDW: 15 % (ref 11.5–15.5)
WBC: 10.4 10*3/uL (ref 4.0–10.5)

## 2016-09-18 LAB — BLOOD GAS, ARTERIAL
Acid-Base Excess: 10.1 mmol/L — ABNORMAL HIGH (ref 0.0–2.0)
Bicarbonate: 35.7 mmol/L — ABNORMAL HIGH (ref 20.0–28.0)
Delivery systems: POSITIVE
Drawn by: 257081
Expiratory PAP: 8
FIO2: 45
Inspiratory PAP: 18
O2 Saturation: 98.7 %
Patient temperature: 98.1
RATE: 10 resp/min
pCO2 arterial: 62.5 mmHg — ABNORMAL HIGH (ref 32.0–48.0)
pH, Arterial: 7.373 (ref 7.350–7.450)
pO2, Arterial: 127 mmHg — ABNORMAL HIGH (ref 83.0–108.0)

## 2016-09-18 MED ORDER — POTASSIUM CHLORIDE 20 MEQ PO PACK
40.0000 meq | PACK | Freq: Once | ORAL | Status: AC
  Administered 2016-09-18: 40 meq via ORAL
  Filled 2016-09-18 (×2): qty 2

## 2016-09-18 NOTE — Progress Notes (Signed)
Family Medicine Teaching Service Daily Progress Note Intern Pager: [phone]  Patient name: Tiffany [name] Medical record number: [id number] Date of birth: [birth year] Age: 56 y.o. Gender: female  Primary Care Provider: [name], MD Consultants: CCM (signed off) Code Status: FULL  Pt Overview and Major Events to Date:  8/24 admitted with PEA arrest, intubated 8/24-8/29 - diuresis with IV lasix and zaroxolyn 8/29 - transitioned to home torsemide 9/1 transferred to [place] 9/2 Cr rising, nephro consulted  Assessment and Plan:  Acute on Chronic Respiratory failure 2/2 COPD and pulmonary HTN: Worsening. home O2 requirement is 6L [place] and CPAP at night. Concomitant OSA. S/p 5 day course of azithromycin, ceftriaxone > cefepime. Follows with pulm. Home meds for COPD are albuterol, breo ellipta, singulair, spiriva. Pt retaining CO2 based on ABG and improvement in arousal while on BiPAP.  -BiPAP at night -transfer to stepdown -mucinex -restart home spirva, duonebs to PRN -continue pulmicort nebs -continuous pulse ox  CHF: echo 09/08/16 with EF 60-65%, normal wall motion. Fluid overloaded on admission, now euvolemic on exam. -continue torsemide daily  AoCKD: Cr baseline appears to be 1-1.5. Acute insult to kidneys on admission likely secondary to hypoperfusion. Cr 2.15 >2.24 >2.12 with decreased UOP. May be new baseline Cr vs ATN from arrest/shock per nephro. U/A no protein. Not good dialysis candidate per nephro. Continues on torsemide 60mg . -BMP in am -consulted nephrology, appreciate recs -renal US  Chest soreness: patient complains of left upper chest soreness after coughing. Minimal concern for ACS. Reproducible on exam. -lidocaine patch -continue to monitor  Normocytic Anemia: Hemoglobin 11 03/2016. Drifting down since that time, last Hgb 8.4. Cannot find any recent anemia workup. No signs of bleeding on exam or history. FOBT negative. Iron panel consistent with ACD.    Hypokalemia: K 3.7. Of note, is on home K of 100meq daily. Consider restarting at discharge if requires additional supplementation. K 3.0 this am, received KCl 58meq this am.  Hyperphosphatemia: Phos 5.1 on 8/29 > 5.4. Likely due to acute phosphate load with CPR and hypoperfusion. May be related to AoCKD.  -recheck phos with next lab draw  DMII: last a1c 07/05/16 5.6. At home on metformin 500mg  BID. Minimal sliding scale use this admission.  -discontinue moderate sliding scale -daily CBGs with BMP  Schizophrenia: stable. home meds include cogentin 0.5mg , depakote 500mg  QD, remeron 45mg  daily, geodon. -continue cogentin, remeron, lexapro, depakote, geodon  History of cocaine abuse: UDS negative on admission. -monitor as clinically indicated  Diabetic neuropathy: home gabapentin is 600mg  TID.  -restart home gabapentin   Closed fracture of right tibia: last note from Dr. Erlinda Hong suggesting NWB, due for repeat 2view of right tib-fib.  -continue outpatient ortho follow up -of note, was at SNF recently due to this -will have PT reevaluate -PT/OT  FEN/GI: carb mod diet PPx: heparin subq given AKI  Disposition: continued inpatient management of AKI, respiratory status, volume status, and anemia  Subjective:  Patient was not placed on BiPAP overnight and intermittently responsive to vigorous stimulation per nursing. Placed on BiPAP around 6am. On exam today, patient was intermittently somnolent and unable to cooperate with exam.  Objective: Temp:  [97.8 F (36.6 C)-99.4 F (37.4 C)] 98.6 F (37 C) (09/03 0442) Pulse Rate:  [68-81] 72 (09/03 0620) Resp:  [16-21] 16 (09/03 0620) BP: (97-133)/(59-81) 133/81 (09/03 0442) SpO2:  [94 %-100 %] 96 % (09/03 0620) Weight:  [131 kg (288 lb 12.8 oz)] 131 kg (288 lb 12.8 oz) (09/03 0157)  Physical  Exam: General: Obese female lying in bed in NAD with BiPAP in place, sleeping.  Cardiovascular: RRR, no murmur Respiratory: distant lung sounds likely  due to body habitus,CTA bilaterally to anterior chest  Abdomen: soft, protuberant, nontender +BS Extremities: no edema, warm, SCDs in place  Laboratory:  Recent Labs Lab 09/13/16 0410 09/14/16 0231 09/18/16 0244  WBC 12.8* 13.3* 10.4  HGB 7.9* 8.1* 8.4*  HCT 27.3* 27.8* 28.7*  PLT 479* 498* 620*    Recent Labs Lab 09/16/16 0550 09/17/16 0417 09/18/16 0244  NA 143 140 142  K 3.0* 3.7 3.0*  CL 94* 94* 96*  CO2 34* 32 35*  BUN 92* 96* 89*  CREATININE 2.15* 2.24* 2.12*  CALCIUM 10.3 10.2 9.8  GLUCOSE 99 106* 106*   ABG    Component Value Date/Time   PHART 7.338 (L) 09/17/2016 0830   PCO2ART 65.2 (HH) 09/17/2016 0830   PO2ART 96.2 09/17/2016 0830   HCO3 34.1 (H) 09/17/2016 0830   TCO2 33 (H) 09/08/2016 0652   ACIDBASEDEF 0.5 06/24/2015 1510   O2SAT 96.5 09/17/2016 0830    Imaging/Diagnostic Tests: No new imaging studies.  Rory Percy, DO 09/18/2016, 6:59 AM PGY-1, [place]

## 2016-09-18 NOTE — Progress Notes (Signed)
Per RN, patient is sleepy and hard to arouse. Placed pt on BIPAP 12/6 via [facility], with 4 L O2 bleed in. Previously, pt declined BIPAP Per RT, stating "she didn't know why we were doing this to her" and "she didn't need BIPAP".

## 2016-09-18 NOTE — Progress Notes (Signed)
PT Cancellation Note  Patient Details Name: Tiffany [name] MRN: [id number] DOB: [birth year]   Cancelled Treatment:    Reason Eval/Treat Not Completed: Medical issues which prohibited therapy   Respiratory insufficiency requiring BiPap;   Will follow up later today as time allows;  Otherwise, will follow up for PT tomorrow;   Thank you,  Roney Marion, PT  Acute Rehabilitation Services Pager [phone] Office [phone]     [place] 09/18/2016, 9:09 AM

## 2016-09-18 NOTE — Progress Notes (Signed)
Patient is on BiPAP and is too lethargic to take PO medication at this time.

## 2016-09-18 NOTE — Progress Notes (Signed)
Impression: 1. Acute on CKD3 - baseline creat 1- 1.5.  Here with arrest and acute CHF (diast).  Diuresed with sig rise in creat.  Renal US not done yet 2. OSA w/ sever hypoventilation 3. Morbid obesity 4. SP PEA arrest 5. Diast CHF 6. Hx acute cholecystitis - sp chole tube 7. Bipolar/ chronic schizophrenia 8. Pyuria 9.   Hypokalemia, will replace   Subjective: Interval History: resp insuff requiring urgent BiPap this AM/ no reported UOP yesterday  Objective: Vital signs in last 24 hours: Temp:  [98.1 F (36.7 C)-99.4 F (37.4 C)] 98.1 F (36.7 C) (09/03 0719) Pulse Rate:  [68-79] 72 (09/03 0749) Resp:  [16-26] 26 (09/03 0749) BP: (102-133)/(65-81) 118/71 (09/03 0719) SpO2:  [94 %-100 %] 95 % (09/03 0749) FiO2 (%):  [35 %] 35 % (09/03 0749) Weight:  [131 kg (288 lb 12.8 oz)] 131 kg (288 lb 12.8 oz) (09/03 0157) Weight change: 0.299 kg (10.5 oz)  Intake/Output from previous day: 09/02 0701 - 09/03 0700 In: 600 [P.O.:600] Out: 0  Intake/Output this shift: No intake/output data recorded.  General appearance: wearing BiPap and wants it off Resp: diff to auscultate due to BiPAP Cardio: distant GI: protuberant and nontenderNo edema obese  Lab Results:  Recent Labs  09/18/16 0244  WBC 10.4  HGB 8.4*  HCT 28.7*  PLT 620*   BMET:  Recent Labs  09/17/16 0417 09/18/16 0244  NA 140 142  K 3.7 3.0*  CL 94* 96*  CO2 32 35*  GLUCOSE 106* 106*  BUN 96* 89*  CREATININE 2.24* 2.12*  CALCIUM 10.2 9.8   No results for input(s): PTH in the last 72 hours. Iron Studies:  Recent Labs  09/17/16 0417  IRON 43  TIBC 461*  FERRITIN 63   Studies/Results: No results found.  Scheduled: . benztropine  0.5 mg Oral Daily  . bisacodyl  10 mg Rectal Once  . budesonide (PULMICORT) nebulizer solution  0.5 mg Nebulization BID  . divalproex  500 mg Oral Daily  . docusate  100 mg Oral BID  . escitalopram  20 mg Oral Daily  . gabapentin  600 mg Oral TID  . guaiFENesin  600  mg Oral BID  . heparin  5,000 Units Subcutaneous Q8H  . lidocaine  1 patch Transdermal Q24H  . mirtazapine  30 mg Oral QHS  . montelukast  10 mg Oral QHS  . pantoprazole sodium  40 mg Oral QHS  . polyethylene glycol  17 g Oral Daily  . tiotropium  18 mcg Inhalation Daily  . torsemide  60 mg Per Tube Daily  . ziprasidone  80 mg Oral QHS     LOS: 10 days   Tiffany [name] C 09/18/2016,8:38 AM

## 2016-09-18 NOTE — Progress Notes (Signed)
Physical Therapy Treatment Patient Details Name: Tiffany [name] MRN: [id number] DOB: 10/22/1960 Today's Date: 09/18/2016    History of Present Illness 56 year old female brought in with dyspnea and O2 sats at 60%.   Upon arrival to ED she had agonal respirations and was found to be pulseless. CPR was initiated and continued for approximately 10-15 minutes PEA. She was placed on ventilator post arrest. CXR c/w pulmonary edema.  PMH  significant for COPD on 6L O2, CHF, CKD, cocaine abuse, CAD, OSA on CPAP, morbid obesity , manic depression, h/o seizures and DM. Recently admitted in June for Right sided tib/fib fracture and underwent surgical repair.      PT Comments    Continuing work on functional mobility and activity tolerance;  So far, weight bearing status of NWB RLE remains; plan to follow-up with Ortho and see if WB status can be liberalized (pt is interested in leaving boot behind as well); Reinforced the need for boot and NWB until Ortho says otherwise to pt   Follow Up Recommendations  Home health PT     Equipment Recommendations  None recommended by PT    Recommendations for Other Services       Precautions / Restrictions Precautions Precautions: Fall Restrictions RLE Weight Bearing: Non weight bearing Other Position/Activity Restrictions: Pt performing more TDWB during stand pivot transfers.     Mobility  Bed Mobility Overal bed mobility: Needs Assistance Bed Mobility: Supine to Sit     Supine to sit: Min assist     General bed mobility comments: Min handheld assist to pull to sit  Transfers Overall transfer level: Needs assistance   Transfers: Lateral/Scoot Transfers;Squat Pivot Transfers     Squat pivot transfers: Min assist    Lateral/Scoot Transfers: Min assist General transfer comment: Discussed continued need to keep weight off of RLE, and therefore we would employ lateral scoot transfer; Overall good sitting balance and weight shift to scoot;  inefficient movement, though and noted some dyspnea; for last motion to the chair, she lean forward and essentially squat pivot transferred to chair, very likely putting too much weight on RLE  Ambulation/Gait                 Stairs            Wheelchair Mobility    Modified Rankin (Stroke Patients Only)       Balance     Sitting balance-Leahy Scale: Good       Standing balance-Leahy Scale: Poor Standing balance comment: reliant on holding onto bed rails and chair for balance                            Cognition Arousal/Alertness: Awake/alert Behavior During Therapy: WFL for tasks assessed/performed Overall Cognitive Status: Within Functional Limits for tasks assessed                                        Exercises      General Comments        Pertinent Vitals/Pain Pain Assessment: Faces Faces Pain Scale: Hurts little more Pain Location: right leg Pain Descriptors / Indicators: Discomfort Pain Intervention(s): Monitored during session    Home Living                      Prior Function  PT Goals (current goals can now be found in the care plan section) Acute Rehab PT Goals Patient Stated Goal: go home, be able to put weight on this leg PT Goal Formulation: With patient Time For Goal Achievement: 09/23/16 Potential to Achieve Goals: Good Progress towards PT goals: Progressing toward goals    Frequency    Min 3X/week      PT Plan Current plan remains appropriate    Co-evaluation              AM-PAC PT "6 Clicks" Daily Activity  Outcome Measure  Difficulty turning over in bed (including adjusting bedclothes, sheets and blankets)?: A Little Difficulty moving from lying on back to sitting on the side of the bed? : A Little Difficulty sitting down on and standing up from a chair with arms (e.g., wheelchair, bedside commode, etc,.)?: A Little Help needed moving to and from a bed to  chair (including a wheelchair)?: A Little Help needed walking in hospital room?: Total Help needed climbing 3-5 steps with a railing? : Total 6 Click Score: 14    End of Session Equipment Utilized During Treatment: Oxygen Activity Tolerance: Patient tolerated treatment well Patient left: in chair;with call bell/phone within reach;with family/visitor present Nurse Communication: Mobility status PT Visit Diagnosis: Other abnormalities of gait and mobility (R26.89)     Time: 8891-6945 PT Time Calculation (min) (ACUTE ONLY): 28 min  Charges:  $Therapeutic Activity: 23-37 mins                    G Codes:       Roney Marion, PT  Acute Rehabilitation Services Pager [phone] Office [phone]    Colletta [place] 09/18/2016, 3:46 PM

## 2016-09-18 NOTE — Progress Notes (Signed)
Report called to [facility] on [address] [facility].  Will move morning mediations to 12 noon to be given when patient is more alert.  All questions answered.

## 2016-09-18 NOTE — Progress Notes (Signed)
FPTS Interim Progress Note  S:  Received page from nursing reporting patient is very sleepy this morning. Per report, patient did not have biap overnight and this was placed around 6AM.   She had similar symptoms yesterday morning. ABG was done at that time which showed CO2 retention. She was placed on bipap and she became awake and alert.   O: BP 118/71 (BP Location: Left Arm)   Pulse 71   Temp 98.1 F (36.7 C) (Oral)   Resp (!) 22   Ht 5\' 4"  (1.626 m)   Wt 288 lb 12.8 oz (131 kg)   SpO2 96%   BMI 49.57 kg/m   GEN: NAD, opens eyes but is very sleepy. Protecting airway. CV: RRR, no murmurs, rubs, or gallops PULM: CTAB, normal effort  A/P: This is likely due to CO2 retention. Will transfer to stepdown so she can get BiPap during the morning. If no improvement in 1 hr, then will obtain ABG.   Smiley Houseman, MD 09/18/2016, 7:25 AM PGY-3, [place] [facility] pager [phone]

## 2016-09-18 NOTE — Significant Event (Signed)
Rapid Response Event Note  Overview: Notified BT RT that patient needs BIPAP Time Called: 0715 Arrival Time: 0717 Event Type: Respiratory  Initial Focused Assessment:  Notified by Rt that patient is again obtunded this am, due to patient not wearing BIPAP last night.  BIPAP was placed on patient around 0600.     Interventions:  BIPAP placed on patient by RT.  RRT sitting with patient until SDU bed available  Plan of Care (if not transferred):  RRT sat with patient until time of transfer to SDU and assisted with transport to SDU.  Patient tolerated BIPAP well and at time of transfer is conversant and asking for BIPAP mask to be removed.    Event Summary:  Rn to call if assistance needed   at      at          [facility], Harlin Rain

## 2016-09-18 NOTE — Progress Notes (Signed)
This nurse was told that pt was very sleepy and hard to arouse this am while getting vitals. Pt does not have Bipap on. This nurse called respiratory this am and was told that pt stated " I don't want it on and I don't need it." Pt is still very sleepy, but this nurse is able to arouse her. Respiratory offered to come and try to put it on pt again. This nurse told respiratory to come and see if she will let them while she is still sleeping.   Eleanora Neighbor, RN

## 2016-09-18 NOTE — Progress Notes (Signed)
Placed PT on V60 bipap  PT was not arousable   Setting are 18/8 35%

## 2016-09-19 ENCOUNTER — Telehealth (INDEPENDENT_AMBULATORY_CARE_PROVIDER_SITE_OTHER): Payer: Self-pay | Admitting: Radiology

## 2016-09-19 ENCOUNTER — Inpatient Hospital Stay (HOSPITAL_COMMUNITY): Payer: Medicare HMO

## 2016-09-19 ENCOUNTER — Telehealth: Payer: Self-pay | Admitting: Emergency Medicine

## 2016-09-19 DIAGNOSIS — Z9911 Dependence on respirator [ventilator] status: Secondary | ICD-10-CM

## 2016-09-19 DIAGNOSIS — Z9289 Personal history of other medical treatment: Secondary | ICD-10-CM

## 2016-09-19 LAB — BASIC METABOLIC PANEL
Anion gap: 12 (ref 5–15)
BUN: 79 mg/dL — ABNORMAL HIGH (ref 6–20)
CO2: 35 mmol/L — ABNORMAL HIGH (ref 22–32)
Calcium: 9.9 mg/dL (ref 8.9–10.3)
Chloride: 95 mmol/L — ABNORMAL LOW (ref 101–111)
Creatinine, Ser: 1.95 mg/dL — ABNORMAL HIGH (ref 0.44–1.00)
GFR calc Af Amer: 32 mL/min — ABNORMAL LOW (ref >60)
GFR calc non Af Amer: 27 mL/min — ABNORMAL LOW (ref >60)
Glucose, Bld: 101 mg/dL — ABNORMAL HIGH (ref 65–99)
Potassium: 3.1 mmol/L — ABNORMAL LOW (ref 3.5–5.1)
Sodium: 142 mmol/L (ref 135–145)

## 2016-09-19 LAB — GLUCOSE, CAPILLARY
Glucose-Capillary: 118 mg/dL — ABNORMAL HIGH (ref 65–99)
Glucose-Capillary: 108 mg/dL — ABNORMAL HIGH (ref 65–99)
Glucose-Capillary: 88 mg/dL (ref 65–99)
Glucose-Capillary: 120 mg/dL — ABNORMAL HIGH (ref 65–99)

## 2016-09-19 LAB — PHOSPHORUS: Phosphorus: 4.6 mg/dL (ref 2.5–4.6)

## 2016-09-19 MED ORDER — POTASSIUM CHLORIDE 20 MEQ/15ML (10%) PO SOLN
40.0000 meq | Freq: Once | ORAL | Status: AC
  Administered 2016-09-19: 40 meq via ORAL
  Filled 2016-09-19: qty 30

## 2016-09-19 MED ORDER — POTASSIUM CHLORIDE CRYS ER 20 MEQ PO TBCR
40.0000 meq | EXTENDED_RELEASE_TABLET | Freq: Once | ORAL | Status: AC
  Administered 2016-09-19: 40 meq via ORAL
  Filled 2016-09-19: qty 2

## 2016-09-19 MED ORDER — ATORVASTATIN CALCIUM 40 MG PO TABS
40.0000 mg | ORAL_TABLET | Freq: Every day | ORAL | Status: DC
Start: 2016-09-19 — End: 2016-09-21
  Administered 2016-09-19 – 2016-09-20 (×2): 40 mg via ORAL
  Filled 2016-09-19 (×2): qty 1

## 2016-09-19 MED ORDER — ASPIRIN EC 81 MG PO TBEC
81.0000 mg | DELAYED_RELEASE_TABLET | Freq: Every day | ORAL | Status: DC
  Administered 2016-09-19 – 2016-09-21 (×3): 81 mg via ORAL
  Filled 2016-09-19 (×3): qty 1

## 2016-09-19 MED ORDER — POTASSIUM CHLORIDE CRYS ER 20 MEQ PO TBCR
20.0000 meq | EXTENDED_RELEASE_TABLET | Freq: Two times a day (BID) | ORAL | Status: DC
  Administered 2016-09-19 – 2016-09-20 (×4): 20 meq via ORAL
  Filled 2016-09-19 (×4): qty 1

## 2016-09-19 MED ORDER — DIVALPROEX SODIUM ER 500 MG PO TB24
1000.0000 mg | ORAL_TABLET | Freq: Every day | ORAL | Status: DC
  Administered 2016-09-20 – 2016-09-21 (×2): 1000 mg via ORAL
  Filled 2016-09-19 (×2): qty 2

## 2016-09-19 NOTE — Discharge Summary (Signed)
[facility] Discharge Summary  Patient name: Tiffany [name] Medical record number: [id number] Date of birth: 12/03/1960 Age: 56 y.o. Gender: female Date of Admission: 09/08/2016  Date of Discharge: 09/20/2016 Admitting Physician: Collene Gobble, MD  Primary Care Provider: [name], MD Consultants: Pulmonology/CCM  Indication for Hospitalization: Respiratory Failure s/p PEA arrest requiring intubation  Discharge Diagnoses/Problem List:  Acute on Chronic respiratory failure 2/2 COPD and pulm HTN CHF Acute on Chronic Kidney Disease Normocytic Anemia, likely due to kidney disease DM2 Schizophrenia H/o cocaine abuse Diabetic neuropathy Closed fracture of right tibia   Disposition: Home  Discharge Condition: Improved  Discharge Exam:  General: Obese female lying in bed in NAD.  Cardiovascular: RRR, no murmur Respiratory: distant lung sounds likely due to body habitus,CTA bilaterally.  Abdomen: soft, protuberant, nontender +BS Extremities: no edema, warm, SCDs in place  Brief Hospital Course:  56yo F presented to the ED via EMS with dyspnea, subsequently became unresponsive and PEA arrest. She achieved ROSC after 10-15 minutes of CPR and intubated. CXR consistent with pulmonary edema. She diuresed well with IV lasix and zaroxolyn and extubated after 6 days.  After transitioning to home torsemide, she was transferred to the [facility] In[facility]. Cr on admission 2.15 and trended down to 1.7 prior to discharge. Baseline Cr 1-1.5 prior to this admission. Renal U/S within normal limits. After being found unresponsive and lethargic while sleeping on home oxygen via [place], she was transitioned to BiPAP based on ABG indicative for CO2 retention. She required use of BiPAP at night and was consistently lethargic and unarousable while sleeping without use. While hospitalized, Dr. Erlinda Hong with orthopedics re-evaluated her R tib/fib fracture and cleared  her for weight bearing as tolerated with PT follow up. Of note, she had a witnessed fall during this admission when transitioning off of her bedside commode with no immediate injuries and stable vital signs, however, did complain of ankle pain a few hours later.  Ankle xray was negative for fracture and patient refused reevaluation by PT but PT still felt she is able to ambulate at home. Her diabetes was well controlled this admission off of Metformin and required little sliding scale use.  Of note, patient refused 3 days of her home cogentin because she thought it gave her tremors and thought the dose was wrong.  She was counseled on the use of Cogentin and was amenable to taking it.  Discussed talking about dosage regimens once outpatient with her primary doctor.  Issues for Follow Up:  1. Ensure toleration and compliance of home BiPAP. 2. Patient is to receive home health PT, OT, nursing. 3. Address patient's further concerns about taking Cogentin (see above).  Significant Procedures:  S/p CPR with ROSC 08/24  Significant Labs and Imaging:   Recent Labs Lab 09/18/16 0244  WBC 10.4  HGB 8.4*  HCT 28.7*  PLT 620*    Recent Labs Lab 09/17/16 0417 09/18/16 0244 09/19/16 0322 09/20/16 1548 09/21/16 0334  NA 140 142 142 145 143  K 3.7 3.0* 3.1* 3.8 3.1*  CL 94* 96* 95* 98* 98*  CO2 32 35* 35* 35* 35*  GLUCOSE 106* 106* 101* 92 113*  BUN 96* 89* 79* 53* 49*  CREATININE 2.24* 2.12* 1.95* 1.56* 1.70*  CALCIUM 10.2 9.8 9.9 9.8 9.8  MG  --   --   --   --  2.0  PHOS 5.4*  --  4.6  --   --  Renal US IMPRESSION: 1. No specific renal or bladder abnormality is observed. Somewhat limited characterization left kidney due to patient body habitus and poor sonic windows related to bowel positioning. 2. Cholelithiasis.  Results/Tests Pending at Time of Discharge: None  Discharge Medications:  Allergies as of 09/21/2016      Reactions   Hydroxyzine Shortness Of Breath   -throat  closed up   Latuda [lurasidone Hcl] Anaphylaxis   Codeine Nausea And Vomiting   Sulfa Antibiotics Itching      Medication List    STOP taking these medications   naproxen 500 MG EC tablet Commonly known as:  NAPROXEN DR     TAKE these medications   ACCU-CHEK AVIVA PLUS w/Device Kit CHECK BLOOD SUGAR THREE TIMES DAILY   VENTOLIN HFA 108 (90 Base) MCG/ACT inhaler Generic drug:  albuterol Inhale 2 puffs into the lungs every 6 (six) hours as needed for wheezing or shortness of breath.   albuterol (2.5 MG/3ML) 0.083% nebulizer solution Commonly known as:  PROVENTIL Take 3 mLs (2.5 mg total) by nebulization every 6 (six) hours as needed for wheezing or shortness of breath.   amLODipine 5 MG tablet Commonly known as:  NORVASC Take 1 tablet (5 mg total) by mouth daily.   aspirin 81 MG EC tablet Take 1 tablet (81 mg total) by mouth daily. Swallow whole.   atorvastatin 40 MG tablet Commonly known as:  LIPITOR Take 1 tablet (40 mg total) by mouth daily at bedtime   B-D SINGLE USE SWABS REGULAR Pads CHECK CAPILLARY BLOOD GLUCOSE ONE TIME DAILY   benztropine 0.5 MG tablet Commonly known as:  COGENTIN Take 1 tablet (0.5 mg total) by mouth daily.   divalproex 500 MG 24 hr tablet Commonly known as:  [name] ER Take 2 tablets (1,000 mg total) by mouth at bedtime. What changed:  when to take this   escitalopram 20 MG tablet Commonly known as:  LEXAPRO Take 1 tablet (20 mg total) by mouth daily every morning   fluticasone furoate-vilanterol 100-25 MCG/INH Aepb Commonly known as:  BREO ELLIPTA INHALE 1 PUFF EVERY DAY   gabapentin 600 MG tablet Commonly known as:  NEURONTIN Take 1 tablet (600 mg total) by mouth 3 (three) times daily.   glucose blood test strip Use to check blood sugar 3 times daily.   metFORMIN 500 MG 24 hr tablet Commonly known as:  GLUCOPHAGE-XR Take 1 tablet (500 mg total) by mouth 2 (two) times daily.   mirtazapine 30 MG tablet Commonly known as:   REMERON Take 30 mg by mouth at bedtime. What changed:  Another medication with the same name was removed. Continue taking this medication, and follow the directions you see here.   montelukast 10 MG tablet Commonly known as:  SINGULAIR Take 1 tablet (10 mg total) by mouth at bedtime.   multivitamin with minerals Tabs tablet Take 1 tablet by mouth daily.   omeprazole 40 MG capsule Commonly known as:  PRILOSEC Take 1 capsule (40 mg total) by mouth 2 (two) times daily.   OXYGEN Inhale 6 L into the lungs daily.   potassium chloride SA 20 MEQ tablet Commonly known as:  K-DUR,KLOR-CON Take 1 tablet (20 mEq total) by mouth 2 (two) times daily. What changed:  how much to take   tiotropium 18 MCG inhalation capsule Commonly known as:  SPIRIVA HANDIHALER Place 1 capsule (18 mcg total) into inhaler and inhale daily.   torsemide 20 MG tablet Commonly known as:  DEMADEX Take 3 tablets (  60 mg total) by mouth daily. What changed:  how much to take  when to take this   ziprasidone 80 MG capsule Commonly known as:  GEODON Take 1 capsule (80 mg total) by mouth at bedtime.            Durable Medical Equipment        Start     Ordered   09/20/16 1110  For home use only DME Walker rolling  Once    Question:  Patient needs a walker to treat with the following condition  Answer:  Tibia/fibula fracture   09/20/16 1110   09/19/16 1604  For home use only DME Bipap  Once    Comments:  12/6 with 6L bleed in  Question Answer Comment  Bleed in oxygen (LPM) 6   Keep 02 saturation 88   Inspiratory pressure 12   Expiratory pressure OTHER SEE COMMENTS      09/19/16 1605       Discharge Care Instructions        Start     Ordered   09/21/16 0000  potassium chloride SA (K-DUR,KLOR-CON) 20 MEQ tablet  2 times daily     09/21/16 1300   09/21/16 0000  torsemide (DEMADEX) 20 MG tablet  Daily     09/21/16 1300      Discharge Instructions: Please refer to Patient Instructions  section of EMR for full details.  Patient was counseled important signs and symptoms that should prompt return to medical care, changes in medications, dietary instructions, activity restrictions, and follow up appointments.   Follow-Up Appointments: Follow-up Information    Juanito Doom, MD Follow up.   Specialty:  Pulmonary Disease Contact information: [place] [address] [place] Follow up.   Why:  appointment on 10/11/2016 at 8:00 pm, please come 30 minutes prior to your appointment to get check in Contact information: [address] [place], [place] Follow up.   Why:  Home Health Physical Therapy, Occupational Therapy, aide and Speech Therapy -agency will call you to arrange initial appointment Contact information: [address] [place] [address] [phone]        Leandrew Koyanagi, MD Follow up in 6 week(s).   Specialty:  Orthopedic Surgery Contact information: [place] [address] [place], [place], DO 09/21/2016, 1:07 PM PGY-1, [place]

## 2016-09-19 NOTE — Care Management Important Message (Signed)
Important Message  Patient Details  Name: Tiffany [name] MRN: [id number] Date of Birth: [birth year]   Medicare Important Message Given:  Yes    [name] Abena 09/19/2016, 11:00 AM

## 2016-09-19 NOTE — Consult Note (Signed)
   [facility] CM Inpatient Consult   09/19/2016  Tiffany [name] [birth year] [id number]   Update from inpatient RNCM that the patient will need to discharge home with BiPap but, may have issues with cost.  Met with the patient at the bedside regarding post hospital follow up needs in the [facility] [place].  Patient consents to ongoing [facility] follow up in the community.   Spoke with inpatient RNCM, Edwin Cap regarding needs and she is working on the criteria for the patient to get BiPap and her Sleep Study is not until 10/11/16.  [facility] will continue to follow for post hospital needs.  For questions, please contact:  Natividad Brood, RN BSN [facility] Liaison  [phone] business mobile phone Toll free office [phone]

## 2016-09-19 NOTE — Progress Notes (Signed)
PT Note  Called [facility] [employer] to ask if someone could check on pt to see if weight bearing could be changed. Pt was supposed to have had follow up appt there the day she was admitted. Pt up in chair and reports she is comfortable with her ability to transfer and feels she won't be able to progress further until she is able to weight bear.   [employer] PT [phone]

## 2016-09-19 NOTE — Progress Notes (Signed)
Family Medicine Teaching Service Daily Progress Note Intern Pager: [phone]  Patient name: Tiffany [name] Medical record number: [id number] Date of birth: 09/17/1960 Age: 56 y.o. Gender: female  Primary Care Provider: [name], MD Consultants: CCM (signed off) Code Status: FULL  Pt Overview and Major Events to Date:  8/24 admitted with PEA arrest, intubated 8/24-8/29 - diuresis with IV lasix and zaroxolyn 8/29 - transitioned to home torsemide 9/1 transferred to [place] 9/2 Cr rising, nephro consulted  Assessment and Plan:  Acute on Chronic Respiratory failure 2/2 COPD and pulmonary HTN: Worsening. home O2 requirement is 6L [place] and CPAP at night. Concomitant OSA. S/p 5 day course of azithromycin, ceftriaxone > cefepime. Follows with pulm. Home meds for COPD are albuterol, breo ellipta, singulair, spiriva. Pt retaining CO2 based on ABG and improvement in arousal while on BiPAP. Will d/c to home with BiPAP at night with formal sleep study outpatient. -BiPAP at night -transfer to stepdown -mucinex -restart home spirva, duonebs to PRN -continue pulmicort nebs -continuous pulse ox  CHF: echo 09/08/16 with EF 60-65%, normal wall motion. Fluid overloaded on admission, now euvolemic on exam. -continue torsemide daily  AoCKD: Cr baseline appears to be 1-1.5. Acute insult to kidneys on admission likely secondary to hypoperfusion. Cr 2.15 >2.24 >2.12>1.95 with decreased UOP. May be new baseline Cr vs ATN from arrest/shock per nephro. U/A no protein. Not good dialysis candidate per nephro. Continues on torsemide 60mg . No abnormality seen on renal U/S but limited due to body habitus. -BMP in am -consulted nephrology, appreciate recs  Chest soreness: patient complained of left upper chest soreness after coughing earlier this admission. Minimal concern for ACS. No complaints today. -lidocaine patch -continue to monitor  Normocytic Anemia: Hemoglobin 11 03/2016. Drifting down since that  time, last Hgb 8.4 yesterday. Cannot find any recent anemia workup. No signs of bleeding on exam or history. FOBT negative. Iron panel consistent with ACD.   Hypokalemia: K 3.1 this am. Of note, is on home K of 44meq daily. Consider restarting at discharge if requires additional supplementation. Received KCl 80meq yesterday. - oral repletion today KCl 90meq - consider restarting home K during admission.  Hyperphosphatemia, normalized: Phos 5.1 on 8/29 > 5.4 > 4.6. Likely due to acute phosphate load with CPR and hypoperfusion. May be related to AoCKD.   DMII: last a1c 07/05/16 5.6. At home on metformin 500mg  BID. Minimal sliding scale use this admission. CBG this am 101. -discontinue moderate sliding scale -daily CBGs with BMP  Schizophrenia: stable. home meds include cogentin 0.5mg , depakote 500mg  QD, remeron 45mg  daily, geodon. -continue cogentin, remeron, lexapro, depakote, geodon  History of cocaine abuse: UDS negative on admission. -monitor as clinically indicated  Diabetic neuropathy: home gabapentin is 600mg  TID.  -restart home gabapentin   Closed fracture of right tibia: last note from Dr. Erlinda Hong suggesting NWB, due for repeat 2view of right tib-fib.  -continue outpatient ortho follow up -of note, was at SNF recently due to this -will have PT reevaluate - confirmed NWB, recommend home health PT -PT/OT  FEN/GI: carb mod diet PPx: heparin subq given AKI  Disposition: continued inpatient management of AKI, respiratory status, volume status, and anemia  Subjective:  Patient is much more interactive today and off of BiPAP in the room.  States she feels well and has no concerns.  Of note, patient has been refusing cogentin since yesterday because she states it gives her tremors. No other concerns from nursing.  Objective: Temp:  [97.9 F (36.6 C)-98.5  F (36.9 C)] 97.9 F (36.6 C) (09/04 0900) Pulse Rate:  [58-81] 58 (09/04 0503) Resp:  [19-23] 21 (09/04 0503) BP:  (109-155)/(68-82) 109/70 (09/04 0503) SpO2:  [97 %-100 %] 100 % (09/04 0739) FiO2 (%):  [35 %] 35 % (09/03 1103) Weight:  [292 lb 3.2 oz (132.5 kg)] 292 lb 3.2 oz (132.5 kg) (09/04 0503)   Net -19ml.  Physical Exam: General: Obese female lying in bed in NAD.  Cardiovascular: RRR, no murmur Respiratory: distant lung sounds likely due to body habitus,CTA bilaterally.  Abdomen: soft, protuberant, nontender +BS Extremities: no edema, warm, SCDs in place  Laboratory:  Recent Labs Lab 09/13/16 0410 09/14/16 0231 09/18/16 0244  WBC 12.8* 13.3* 10.4  HGB 7.9* 8.1* 8.4*  HCT 27.3* 27.8* 28.7*  PLT 479* 498* 620*    Recent Labs Lab 09/17/16 0417 09/18/16 0244 09/19/16 0322  NA 140 142 142  K 3.7 3.0* 3.1*  CL 94* 96* 95*  CO2 32 35* 35*  BUN 96* 89* 79*  CREATININE 2.24* 2.12* 1.95*  CALCIUM 10.2 9.8 9.9  GLUCOSE 106* 106* 101*   ABG    Component Value Date/Time   PHART 7.373 09/18/2016 1136   PCO2ART 62.5 (H) 09/18/2016 1136   PO2ART 127 (H) 09/18/2016 1136   HCO3 35.7 (H) 09/18/2016 1136   TCO2 33 (H) 09/08/2016 0652   ACIDBASEDEF 0.5 06/24/2015 1510   O2SAT 98.7 09/18/2016 1136    Imaging/Diagnostic Tests:  Renal US IMPRESSION: 1. No specific renal or bladder abnormality is observed. Somewhat limited characterization left kidney due to patient body habitus and poor sonic windows related to bowel positioning. 2. Cholelithiasis.  Rory Percy, DO 09/19/2016, 9:15 AM PGY-1, [place]

## 2016-09-19 NOTE — Progress Notes (Signed)
Impression: 1. Acute on CKD3 - baseline creat 1- 1.5. s/p arrest and  CHF (diast). Diuresed-AKI  Renal US neg 2. OSA w/ severe hypoventilation 3. Morbid obesity 4. SP PEA arrest 5. Diast CHF 6. Hx acute cholecystitis - sp chole tube 7. Bipolar/ chronic schizophrenia 8. Pyuria 9.   Hypokalemia, replace  Subjective: Interval History:  Objective: Vital signs in last 24 hours: Temp:  [97.9 F (36.6 C)-98.5 F (36.9 C)] 97.9 F (36.6 C) (09/04 0900) Pulse Rate:  [58-81] 58 (09/04 0503) Resp:  [19-23] 21 (09/04 0503) BP: (109-155)/(68-82) 109/70 (09/04 0503) SpO2:  [100 %] 100 % (09/04 0739) Weight:  [132.5 kg (292 lb 3.2 oz)] 132.5 kg (292 lb 3.2 oz) (09/04 0503) Weight change: 1.542 kg (3 lb 6.4 oz)  Intake/Output from previous day: 09/03 0701 - 09/04 0700 In: 480 [P.O.:480] Out: 600 [Urine:600] Intake/Output this shift: No intake/output data recorded.  General appearance: alert Resp: diminished breath sounds bilaterally Cardio: regular rate and rhythm, S1, S2 normal, no murmur, click, rub or gallop Extremities: edema 1-2+  Lab Results:  Recent Labs  09/18/16 0244  WBC 10.4  HGB 8.4*  HCT 28.7*  PLT 620*   BMET:  Recent Labs  09/18/16 0244 09/19/16 0322  NA 142 142  K 3.0* 3.1*  CL 96* 95*  CO2 35* 35*  GLUCOSE 106* 101*  BUN 89* 79*  CREATININE 2.12* 1.95*  CALCIUM 9.8 9.9   No results for input(s): PTH in the last 72 hours. Iron Studies:  Recent Labs  09/17/16 0417  IRON 43  TIBC 461*  FERRITIN 63   Studies/Results: [name] Retroperitoneal (renal,aorta,ivc Nodes)  Result Date: 09/18/2016 CLINICAL DATA:  Acute kidney injury. EXAM: RENAL / URINARY TRACT ULTRASOUND COMPLETE COMPARISON:  Radiographs dated 04/10/2016 FINDINGS: Right Kidney: Length: 11.3 cm. Echogenicity within normal limits. No mass or hydronephrosis visualized. Left Kidney: Length: 10.1 cm. Echogenicity within normal limits. No mass or hydronephrosis visualized. Somewhat limited  visualization due to bowel gas causing poor sonic windows. Bladder: Appears normal for degree of bladder distention. Incidental note is made of gallstones in the gallbladder on image 33. IMPRESSION: 1. No specific renal or bladder abnormality is observed. Somewhat limited characterization left kidney due to patient body habitus and poor sonic windows related to bowel positioning. 2. Cholelithiasis. Electronically Signed   By: Van Clines M.D.   On: 09/18/2016 13:06    Scheduled: . benztropine  0.5 mg Oral Daily  . bisacodyl  10 mg Rectal Once  . budesonide (PULMICORT) nebulizer solution  0.5 mg Nebulization BID  . divalproex  500 mg Oral Daily  . docusate  100 mg Oral BID  . escitalopram  20 mg Oral Daily  . gabapentin  600 mg Oral TID  . guaiFENesin  600 mg Oral BID  . heparin  5,000 Units Subcutaneous Q8H  . lidocaine  1 patch Transdermal Q24H  . mirtazapine  30 mg Oral QHS  . montelukast  10 mg Oral QHS  . pantoprazole sodium  40 mg Oral QHS  . polyethylene glycol  17 g Oral Daily  . tiotropium  18 mcg Inhalation Daily  . torsemide  60 mg Per Tube Daily  . ziprasidone  80 mg Oral QHS    LOS: 11 days   Loyce Klasen C 09/19/2016,12:18 PM

## 2016-09-19 NOTE — Progress Notes (Signed)
Contacted RT to set overnight pulse oximetry. Jonnie Finner RN CCM Case Mgmt phone [phone]

## 2016-09-19 NOTE — Care Management Note (Addendum)
Case Management Note  Patient Details  Name: Tiffany [name] MRN: [id number] Date of Birth: 06-11-1960  Subjective/Objective:   Acute on chronic resp failure s/t COPD and Pulm HTN, CHF, CKD                 Action/Plan: Discharge Planning: NCM spoke to pt and she has oxygen with [facility]. [name] currently active with [facility] for [facility] for PT, OT, aide and SLP. Contacted [facility] rep for roc and Bipap. Has sleep study arranged for 10/11/2016 at 8 pm. NCM updated pt on upcoming appt.   Received call from [facility] and pt cannot afford to pay for Bipap out of pocket until she has sleep study.   Contacted [employer] and spoke to [place] in the office. Explained situation to see if office can call to have her sleep study date moved up. Made attending aware.   Spoke to [employer] # [phone]. Pt will need overnight pulse ox tonight. Will qualify pt using recent ABG, COPD dx and overnight pulse oximetry. Pt is unable to pay for Bipap initial $250 out of pocket and she does not have a credit card to have on file with [facility]. [facility] will not deliver oxygen to home without credit card and $250 (fee for one month).   Contacted [name] Supervisor and can offer LOG for $250. [facility] unable to accept hospital one LOG (letter of guarantee) for one month payment. States LOG would have to be for 3 months. [name] office cannot approve 3 months. Will continue to follow up with [employer] to qualify pt for Bipap for home.    Spoke to dtr, [name], [phone]. FMLA paperwork completed and faxed to her employer.     PCP MIKELL, Jeani Sow MD   Expected Discharge Date:                 Expected Discharge Plan:  [place]  In-House Referral:  NA  Discharge planning Services  CM Consult  Post Acute Care Choice:  Home Health, Resumption of Svcs/PTA Provider Choice offered to:  Patient  DME Arranged:  Bipap DME Agency:  [facility]  HH Arranged:  OT, PT, SLP, aide [place] Agency:  [place]  Status of Service:  Completed, signed off  If discussed at [place] of Stay Meetings, dates discussed:    Additional Comments:  Erenest Rasher, RN 09/19/2016, 4:01 PM

## 2016-09-19 NOTE — Telephone Encounter (Signed)
Received call from [place] at [facility] Pt is currently admitted as of 8.24.18 She continuously needs BiPAP but insurance won't pay for this until she has a sleep study - her need for BiPAP keeps sending her to the hospital and she's admitted when her sleep studies have been scheduled  Current sleep study is scheduled for 9.26.18 but Edwin Cap is concerned pt will be hospitalized again before the sleep study and is asking that we call the sleep center to see if this can be moved up  Sleep center is currently closed but will call in the morning

## 2016-09-19 NOTE — Telephone Encounter (Signed)
Tiffany [name] is calling, to see if someone could come by and check on patient.  She was suppose to come in and be rechecked but she came to hospital in cardiac arrest so she has not been in to be re-evaled.  [name] in room @ [phone] at [facility].

## 2016-09-19 NOTE — Telephone Encounter (Signed)
See message below °

## 2016-09-20 ENCOUNTER — Inpatient Hospital Stay (HOSPITAL_COMMUNITY): Payer: Medicare HMO

## 2016-09-20 DIAGNOSIS — M25572 Pain in left ankle and joints of left foot: Secondary | ICD-10-CM

## 2016-09-20 LAB — BASIC METABOLIC PANEL
Anion gap: 12 (ref 5–15)
BUN: 53 mg/dL — ABNORMAL HIGH (ref 6–20)
CO2: 35 mmol/L — ABNORMAL HIGH (ref 22–32)
Calcium: 9.8 mg/dL (ref 8.9–10.3)
Chloride: 98 mmol/L — ABNORMAL LOW (ref 101–111)
Creatinine, Ser: 1.56 mg/dL — ABNORMAL HIGH (ref 0.44–1.00)
GFR calc Af Amer: 42 mL/min — ABNORMAL LOW (ref >60)
GFR calc non Af Amer: 36 mL/min — ABNORMAL LOW (ref >60)
Glucose, Bld: 92 mg/dL (ref 65–99)
Potassium: 3.8 mmol/L (ref 3.5–5.1)
Sodium: 145 mmol/L (ref 135–145)

## 2016-09-20 LAB — GLUCOSE, CAPILLARY
Glucose-Capillary: 89 mg/dL (ref 65–99)
Glucose-Capillary: 131 mg/dL — ABNORMAL HIGH (ref 65–99)
Glucose-Capillary: 103 mg/dL — ABNORMAL HIGH (ref 65–99)
Glucose-Capillary: 110 mg/dL — ABNORMAL HIGH (ref 65–99)

## 2016-09-20 NOTE — Progress Notes (Signed)
Xrays of right tib-fib demonstrate signs of continued healing with abundant callus formation.  She may WBAT in CAM boot to RLE.  Up with PT for mobilization and strengthening.  May wean CAM boot as tolerated.  Follow up in office in 6 weeks.  Azucena Cecil, MD [place] 7:29 AM

## 2016-09-20 NOTE — Progress Notes (Signed)
RT NOTE:  Overnight Pulse-Ox report downloaded and printed. Report will be delivered to 3W and placed in patient chart. Report also saved in Respiratory department.

## 2016-09-20 NOTE — Progress Notes (Signed)
Patient found to be with increased lethargy upon 130am  hourly assessment. Pt. Will arouse to loud voice and painful stimuli and to name. Patient taken off evening bed time O2 and CO2 bedside sleep study monitoring and placed back on Bipap per home routine. RT Kim contacted and in route.  English as a second language teacher

## 2016-09-20 NOTE — Care Management (Addendum)
Case Management Note: Initial Note Started by Jonnie Finner, RN,BSN 09-19-16  Patient Details  Name: Tiffany [name] MRN: [id number] Date of Birth: 05/19/1960  Subjective/Objective:   Acute on chronic resp failure s/t COPD and Pulm HTN, CHF, CKD                 Action/Plan: Discharge Planning: NCM spoke to pt and she has oxygen with [facility]. [name] currently active with [facility] for [facility] for PT, OT, aide and SLP. Contacted [facility] rep for roc and Bipap. Has sleep study arranged for 10/11/2016 at 8 pm. NCM updated pt on upcoming appt.   Received call from [facility] and pt cannot afford to pay for Bipap out of pocket until she has sleep study.   Contacted [employer] and spoke to [place] in the office. Explained situation to see if office can call to have her sleep study date moved up. Made attending aware.   Spoke to [employer] # [phone]. Pt will need overnight pulse ox tonight. Will qualify pt using recent ABG, COPD dx and overnight pulse oximetry. Pt is unable to pay for Bipap initial $250 out of pocket and she does not have a credit card to have on file with [facility]. [facility] will not deliver oxygen to home without credit card and $250 (fee for one month).   Contacted [name] Supervisor and can offer LOG for $250. [facility] unable to accept hospital one LOG (letter of guarantee) for one month payment. States LOG would have to be for 3 months. [name] office cannot approve 3 months. Will continue to follow up with [facility] to qualify pt for Bipap for home.    Spoke to dtr, Tiffany [name], [phone]. FMLA paperwork completed and faxed to her employer.     PCP MIKELL, Jeani Sow MD   Expected Discharge Date:                         Expected Discharge Plan:  [place]  In-House Referral:  NA  Discharge planning Services  CM Consult  Post Acute Care Choice:  Home Health, Resumption of Svcs/PTA Provider Choice offered to:  Patient  DME Arranged:  Trilogy Home Vent DME Agency:  [facility]  HH Arranged: RN, OT, PT, SLP, Aide [facility] Agency:  [place]  Status of Service:  Completed, signed off  If discussed at [place] of Stay Meetings, dates discussed:    Additional Comments: [place] 09-21-16 Jacqlyn Krauss, RN,BSN [phone] Pt will be d/c and transported home via [facility]- Pt is on 6 L 02 via [facility]- 02 set up at home. Pt will not need noninvasive vent until nighttime use. Vent will be delivered at 6:00 pm via [facility].    [place] 09-21-16 Jacqlyn Krauss, RN,BSN [phone] The home noninvasive vent has been approved-working with [facility] in regards to see if patient can be seen early today for Non invasive vent Trilogy. No further needs at this time.    1147 09-20-16 Jacqlyn Krauss, RN,BSN [phone]  CM did speak with [facility]) in regards to BIPAP-pt will still have an out of pocket payment of deductible + $40.00 to be paid on card. Pt states that her daughter is willing to pay the $250.00 on credit card for BIPAP needs. CM did call Brad Liaison with [facility]- pt already has DME with them- unsure of why we need to change DME agencies. CM asked Liaison if pt could  benefit from [facility].  Pt has had multiple readmissions- CHF and COPD. Pt is on home 02 @ 6L. If pt goes home on Trilogy this could possibly be covered 100% insurance and no co pay. [facility] to check insurance to see if will be covered 100%. CM will need orders for the Trilogy Home Invasive Vent and Settings. CM did call and cancel BIPAP with [facility]. CM will discuss with MD if Trilogy is appropriate still retaining C02. MD [place] states pt will be appropriate for Home Trilogy- orders signed. [place] RN orders added to [facility]. No further needs from CM at this time.

## 2016-09-20 NOTE — Progress Notes (Signed)
RT NOTE:  Overnight Pulse-ox complete @ 0620. Pt started overnight pulse-ox on 5LNC w/ ETCO2 inline on 09/19/16 @ 2320. Rush Landmark, RN called RT @ 0130 stating patient was difficult to arouse. Pt placed on BIPAP 18/8, RR 12, 30%. Pt tolerated BIPAP well. RT will continue to monitor

## 2016-09-20 NOTE — Progress Notes (Signed)
   09/20/16 1120  What Happened  Was fall witnessed? Yes  Who witnessed fall? [place], NT  Patients activity before fall to/from bed, chair, or stretcher;bathroom-assisted  Point of contact buttocks  Was patient injured? No  Follow Up  MD notified Shela Commons, DO  Time MD notified [phone]  Family notified Yes-comment (pt requests that her mother be notified)  Time family notified [phone]  Additional tests No  Simple treatment Other (comment) (N/A, patient denies pain, no injury occured)  Progress note created (see row info) Yes  Adult Fall Risk Assessment  Risk Factor Category (scoring not indicated) Fall has occurred during this admission (document High fall risk)  Patient's Fall Risk High Fall Risk (>13 points)  Adult Fall Risk Interventions  Required Bundle Interventions *See Row Information* High fall risk - low, moderate, and high requirements implemented  Additional Interventions Use of appropriate toileting equipment (bedpan, BSC, etc.);PT/OT need assessed if change in mobility from baseline;Room near nurses station  Screening for Fall Injury Risk  Risk For Fall Injury- See Row Information  Bones - fracture risk  Required Injury Bundle Interventions *See Row Information* Injury Bundle Implemented Except Low Bed  Screening for Fall Injury Risk Interventions  Additional Interventions PT/OT consult  Specialty Low Bed Contraindicated Hemodynamically unstable (requires BiPAP)  Vitals  BP 139/89  MAP (mmHg) 104  BP Location Left Arm  BP Method Automatic  Patient Position (if appropriate) Sitting  Pulse Rate 73  ECG Heart Rate 77  Resp (!) 32  Oxygen Therapy  SpO2 99 %  O2 Device Nasal Cannula  O2 Flow Rate (L/min) 5 L/min  Pain Assessment  Pain Assessment No/denies pain  Pain Score 0  Neurological  Neuro (WDL) WDL  Level of Consciousness Alert  Orientation Level Oriented X4  Cognition Appropriate at baseline  Speech Clear  Neuro Symptoms None  Glasgow Coma Scale   Eye Opening 4  Best Verbal Response (NON-intubated) 5  Best Motor Response 6  Glasgow Coma Scale Score 15  Musculoskeletal  Musculoskeletal (WDL) X  Assistive Device Front wheel walker;MaxiMove (maximove used to assist patient off floor)  Generalized Weakness Yes  Weight Bearing Restrictions Yes  RLE Weight Bearing WBAT  Musculoskeletal Details  RLE Ortho/Supportive Device  RLE Ortho/Supportive Device Ortho Boot  Integumentary  Integumentary (WDL) X (no new skin issues noted post fall)  Skin Color Appropriate for ethnicity  Skin Condition Dry  Skin Integrity MASD  Moisture Associated Skin Damage Location Groin;Perineum  Moisture Associated Skin Damage Orientation Right;Left  Moisture Associated Skin Damage Intervention Cleansed;Barrier cream  Skin Turgor Non-tenting

## 2016-09-20 NOTE — Progress Notes (Signed)
Physical Therapy Treatment Patient Details Name: Tiffany [name] MRN: [id number] DOB: May 14, 1960 Today's Date: 09/20/2016    History of Present Illness 56 year old female brought in with dyspnea and O2 sats at 60%.   Upon arrival to ED she had agonal respirations and was found to be pulseless. CPR was initiated and continued for approximately 10-15 minutes PEA. She was placed on ventilator post arrest. CXR c/w pulmonary edema.  PMH  significant for COPD on 6L O2, CHF, CKD, cocaine abuse, CAD, OSA on CPAP, morbid obesity , manic depression, h/o seizures and DM. Recently admitted in June for Right sided tib/fib fracture and underwent surgical repair.      PT Comments    Pt now WBAT and was able to amb short distance for the first time in 2+ months. Will need a rolling walker for home now that she is ambulatory.   Follow Up Recommendations  Home health PT     Equipment Recommendations  Rolling walker with 5" wheels    Recommendations for Other Services       Precautions / Restrictions Precautions Precautions: Fall Required Braces or Orthoses: Other Brace/Splint Other Brace/Splint: CAM boot - can wean as tolerated Restrictions Weight Bearing Restrictions: Yes RLE Weight Bearing: Weight bearing as tolerated    Mobility  Bed Mobility Overal bed mobility: Modified Independent Bed Mobility: Supine to Sit     Supine to sit: Modified independent (Device/Increase time)        Transfers Overall transfer level: Needs assistance Equipment used: None;Rolling walker (2 wheeled) Transfers: Risk manager;Sit to/from Stand Sit to Stand: Min guard Stand pivot transfers: Min guard       General transfer comment: Assist for safety. Verbal cues for hand placement with walker  Ambulation/Gait Ambulation/Gait assistance: Min assist Ambulation Distance (Feet): 6 Feet Assistive device: Rolling walker (2 wheeled) Gait Pattern/deviations: Step-through pattern;Decreased step length  - right;Decreased step length - left;Trunk flexed Gait velocity: decr Gait velocity interpretation: Below normal speed for age/gender General Gait Details: Assist for balance and support. Pt amb on 6L of O2 with SpO2 100% and dyspnea 3/4.   Stairs            Wheelchair Mobility    Modified Rankin (Stroke Patients Only)       Balance Overall balance assessment: Needs assistance Sitting-balance support: No upper extremity supported;Feet supported Sitting balance-Leahy Scale: Good     Standing balance support: Bilateral upper extremity supported Standing balance-Leahy Scale: Poor Standing balance comment: UE support                            Cognition Arousal/Alertness: Awake/alert Behavior During Therapy: WFL for tasks assessed/performed Overall Cognitive Status: Within Functional Limits for tasks assessed                                        Exercises      General Comments        Pertinent Vitals/Pain      Home Living                      Prior Function            PT Goals (current goals can now be found in the care plan section) Progress towards PT goals: Progressing toward goals    Frequency    Min  3X/week      PT Plan Current plan remains appropriate    Co-evaluation              AM-PAC PT "6 Clicks" Daily Activity  Outcome Measure  Difficulty turning over in bed (including adjusting bedclothes, sheets and blankets)?: A Little Difficulty moving from lying on back to sitting on the side of the bed? : A Little Difficulty sitting down on and standing up from a chair with arms (e.g., wheelchair, bedside commode, etc,.)?: A Little Help needed moving to and from a bed to chair (including a wheelchair)?: A Little Help needed walking in hospital room?: A Little Help needed climbing 3-5 steps with a railing? : Total 6 Click Score: 16    End of Session Equipment Utilized During Treatment:  Oxygen Activity Tolerance: Patient tolerated treatment well Patient left: in chair;with call bell/phone within reach Nurse Communication: Mobility status PT Visit Diagnosis: Other abnormalities of gait and mobility (R26.89)     Time: 1030-1051 PT Time Calculation (min) (ACUTE ONLY): 21 min  Charges:  $Gait Training: 8-22 mins                    G Codes:       [facility] PT [place] 09/20/2016, 11:04 AM

## 2016-09-20 NOTE — Progress Notes (Addendum)
RT NOTE:  Overnight Pulse-Ox setup @ bedside and monitoring beginning @ 2320. Pt currently wearing 5L [place] w/ ETCO2 detector inline. According to RN we are to monitor patient with [place] throughout night. If patient has apnea event, pt will be placed on BIPAP for the remainder of the night.   ALARMS: SpO2: High: 100% Low: 85% RR:     High: 36bpm Low: 8bpm  BIPAP @ bedside if needed due to apnea event. RT will monitor throughout night. RN in room during setup, will monitor. Pt tolerating well without BIPAP when RT left room

## 2016-09-20 NOTE — Progress Notes (Signed)
Family Medicine Teaching Service Daily Progress Note Intern Pager: [phone]  Patient name: Tiffany [name] Medical record number: [id number] Date of birth: 04-Jan-1961 Age: 56 y.o. Gender: female  Primary Care Provider: [name], MD Consultants: CCM (signed off) Code Status: FULL  Pt Overview and Major Events to Date:  8/24 admitted with PEA arrest, intubated 8/24-8/29 - diuresis with IV lasix and zaroxolyn 8/29 - transitioned to home torsemide 9/1 transferred to [facility] 9/2 Cr rising, nephro consulted 9/3 requiring use of BiPAP at night  Assessment and Plan:  Acute on Chronic Respiratory failure 2/2 severe, end stage COPD and pulmonary HTN: Worsening. home O2 requirement is 6L [place] and CPAP at night. Concomitant OSA. S/p 5 day course of azithromycin, ceftriaxone > cefepime. Follows with pulm. Home meds for COPD are albuterol, breo ellipta, singulair, spiriva. Patient transitioned from CPAP on 9/2, failed and transitioned to BiPAP at night. Pt retaining CO2 based on ABG and improvement in arousal while on BiPAP. Will d/c to home with BiPAP at night with formal sleep study outpatient. -BiPAP at night -transfer to stepdown -mucinex -restart home spirva, duonebs to PRN -continue pulmicort nebs -overnight pulse ox -sleep study 10/11/16  CHF: echo 09/08/16 with EF 60-65%, normal wall motion. Fluid overloaded on admission, now euvolemic on exam. -continue torsemide daily  AoCKD: Cr baseline appears to be 1-1.5. Acute insult to kidneys on admission likely secondary to hypoperfusion. Cr 2.15 >2.24 >2.12>1.95 yesterday with decreased UOP. May be new baseline Cr vs ATN from arrest/shock per nephro. U/A no protein. Not good dialysis candidate per nephro. Continues on torsemide 60mg . No abnormality seen on renal U/S but limited due to body habitus. -consulted nephrology, appreciate recs  Chest soreness: patient complained of left upper chest soreness after coughing earlier this admission.  Minimal concern for ACS. No complaints today. -lidocaine patch -continue to monitor  Normocytic Anemia: Hemoglobin 11 03/2016. Drifting down since that time, last Hgb 8.4 9/3. Cannot find any recent anemia workup. No signs of bleeding on exam or history. FOBT negative. Iron panel consistent with ACD.   Hypokalemia: K 3.1 yesterday. Of note, is on home K of 52meq daily. Consider restarting at discharge if requires additional supplementation. Received KCl 13meq yesterday.  Hyperphosphatemia, normalized: Phos 5.1 on 8/29 > 5.4 > 4.6. Likely due to acute phosphate load with CPR and hypoperfusion. May be related to AoCKD.   DMII: last a1c 07/05/16 5.6. At home on metformin 500mg  BID. Minimal sliding scale use this admission. CBG this am 120. -discontinue moderate sliding scale -daily CBGs with BMP  Schizophrenia: stable. home meds include cogentin 0.5mg , depakote 500mg  QD, remeron 45mg  daily, geodon. -continue cogentin, remeron, lexapro, depakote, geodon  History of cocaine abuse: UDS negative on admission. -monitor as clinically indicated  Diabetic neuropathy: home gabapentin is 600mg  TID.  -restart home gabapentin   Closed fracture of right tibia: last note from Dr. Erlinda Hong suggesting WBAT, due for repeat 2view of right tib-fib.  -continue outpatient ortho follow up in 6 wks -of note, was at SNF recently due to this -PT/OT -wean CAM boot as tolerated  FEN/GI: carb mod diet PPx: heparin subq given AKI  Disposition: continued inpatient management of AKI, respiratory status, volume status, and anemia  Subjective:  Case management working to get patient BiPAP at home, needed overnight pulse ox to demonstrate need for BiPAP.  RT observed overnight with pulse ox, 5L [place] with ETCO2 detector. Pt placed on BiPAP around 0130 due to increased lethargy. Pt tolerating BiPAP well afterwards  per RT.  Ortho saw yesterday and approved WBAT in RLE CAM boot with PT. Follow up with ortho in 6 wks.  Patient  asleep at the time of my exam this morning.  Did not arouse to voice or palpation.  Objective: Temp:  [98 F (36.7 C)-98.3 F (36.8 C)] 98.2 F (36.8 C) (09/05 0700) Pulse Rate:  [61-82] 62 (09/05 0443) Resp:  [15-24] 19 (09/05 0443) BP: (110-124)/(62-82) 110/62 (09/05 0443) SpO2:  [94 %-100 %] 100 % (09/05 0443) FiO2 (%):  [30 %-45 %] 30 % (09/05 0120) Weight:  [291 lb 14.4 oz (132.4 kg)] 291 lb 14.4 oz (132.4 kg) (09/05 0443)   Net -142ml.  Physical Exam: General: Obese female lying in bed in NAD, sleeping  Cardiovascular: RRR, no murmur Respiratory: distant lung sounds likely due to body habitus,CTA bilaterally to anterior chest.  Abdomen: soft, protuberant, nontender +BS Extremities: no edema, warm, SCDs in place, CAM boot on R foot.  Laboratory:  Recent Labs Lab 09/14/16 0231 09/18/16 0244  WBC 13.3* 10.4  HGB 8.1* 8.4*  HCT 27.8* 28.7*  PLT 498* 620*    Recent Labs Lab 09/17/16 0417 09/18/16 0244 09/19/16 0322  NA 140 142 142  K 3.7 3.0* 3.1*  CL 94* 96* 95*  CO2 32 35* 35*  BUN 96* 89* 79*  CREATININE 2.24* 2.12* 1.95*  CALCIUM 10.2 9.8 9.9  GLUCOSE 106* 106* 101*   ABG    Component Value Date/Time   PHART 7.373 09/18/2016 1136   PCO2ART 62.5 (H) 09/18/2016 1136   PO2ART 127 (H) 09/18/2016 1136   HCO3 35.7 (H) 09/18/2016 1136   TCO2 33 (H) 09/08/2016 0652   ACIDBASEDEF 0.5 06/24/2015 1510   O2SAT 98.7 09/18/2016 1136    Imaging/Diagnostic Tests:  R Tib/Fib xray 09/04 IMPRESSION: 1. No significant change in alignment of healing displaced proximal fibular fracture 2. No significant change in alignment of healing mid tibial fracture status post intramedullary rod and screw fixation.  Renal US 09/03 IMPRESSION: 1. No specific renal or bladder abnormality is observed. Somewhat limited characterization left kidney due to patient body habitus and poor sonic windows related to bowel positioning. 2. Cholelithiasis.  Rory Percy,  DO 09/20/2016, 9:24 AM PGY-1, [facility]

## 2016-09-20 NOTE — Progress Notes (Signed)
Nutrition Follow-up  DOCUMENTATION CODES:   Morbid obesity  INTERVENTION:    Continue CHO modified diet, intake is adequate to meet nutrition needs.  NUTRITION DIAGNOSIS:   Inadequate oral intake related to inability to eat as evidenced by NPO status.  Resolved  GOAL:   Patient will meet greater than or equal to 90% of their needs  Met with PO intake  MONITOR:   PO intake, Labs, I & O's  ASSESSMENT:   56 y/o female PMHx COPD, CHF, CKD, cocaine abuse, OSA on CPAP, DM, CAD. Recent R tibula/fibula fx s/p repair. 8/23 at PCP was asked to restart torsemide due to volume overload. 8/24 awoke with dyspnea and 02 sat of 60%. Upon EMS Arrival, pt was pulseless, cpr initiated w/ 10-15 minutes PEA. Intubated in ED. Worked up for pulmonary Edema. RD consulted for TF management.   Patient was extubated on 8/30. [name] consuming 100% of CHO modified meals. No supplements needed. [name] requiring BiPAP at night.  Labs and medications reviewed.  Diet Order:  Diet Carb Modified Fluid consistency: Thin; Room service appropriate? Yes  Skin:   (MASD to groin/perineum)  Last BM:  9/3  Height:   Ht Readings from Last 1 Encounters:  09/08/16 '5\' 4"'  (1.626 m)    Weight:   Wt Readings from Last 1 Encounters:  09/20/16 291 lb 14.4 oz (132.4 kg)    Ideal Body Weight:  54.54 kg  BMI:  Body mass index is 50.1 kg/m.  Estimated Nutritional Needs:   Kcal:  1900-2100  Protein:  90-110 gm  Fluid:  2 L  EDUCATION NEEDS:   No education needs identified at this time  Molli Barrows, [place], [place], [place] Pager [phone] After Hours Pager [phone]

## 2016-09-20 NOTE — Progress Notes (Signed)
Impression: 1. Acute on CKD3 - baseline creat 1- 1.5. s/p arrest and  CHF (diast). Reduce torsemide pending labs 2. OSA w/ severe hypoventilation 3. Morbid obesity 4. SP PEA arrest 5. Diast CHF 6. Hx acute cholecystitis - sp chole tube 7. Bipolar/ chronic schizophrenia 8. Pyuria 9. Hypokalemia, replace  Subjective: Interval History: 4500cc UOP yesterday  Objective: Vital signs in last 24 hours: Temp:  [98 F (36.7 C)-98.3 F (36.8 C)] 98.2 F (36.8 C) (09/05 0700) Pulse Rate:  [61-82] 62 (09/05 0443) Resp:  [15-24] 19 (09/05 0443) BP: (110-124)/(62-82) 110/62 (09/05 0443) SpO2:  [94 %-100 %] 100 % (09/05 0932) FiO2 (%):  [30 %-45 %] 40 % (09/05 0932) Weight:  [132.4 kg (291 lb 14.4 oz)] 132.4 kg (291 lb 14.4 oz) (09/05 0443) Weight change: -0.136 kg (-4.8 oz)  Intake/Output from previous day: 09/04 0701 - 09/05 0700 In: 1560 [P.O.:1560] Out: 4450 [Urine:4450] Intake/Output this shift: No intake/output data recorded.  General appearance: alert and cooperative Resp: clear to auscultation bilaterally Chest wall: no tenderness Cardio: regular rate and rhythm, S1, S2 normal, no murmur, click, rub or gallop  Abd protub Ext tr to 1  Lab Results:  Recent Labs  09/18/16 0244  WBC 10.4  HGB 8.4*  HCT 28.7*  PLT 620*   BMET:  Recent Labs  09/18/16 0244 09/19/16 0322  NA 142 142  K 3.0* 3.1*  CL 96* 95*  CO2 35* 35*  GLUCOSE 106* 101*  BUN 89* 79*  CREATININE 2.12* 1.95*  CALCIUM 9.8 9.9   No results for input(s): PTH in the last 72 hours. Iron Studies: No results for input(s): IRON, TIBC, TRANSFERRIN, FERRITIN in the last 72 hours. Studies/Results: Dg Tibia/fibula Right  Result Date: 09/19/2016 CLINICAL DATA:  History of ORIF EXAM: RIGHT TIBIA AND FIBULA - 2 VIEW COMPARISON:  08/01/2016, 07/12/2016 FINDINGS: Status post intramedullary rod and screw fixation of the tibia across a healing comminuted fracture involving the midshaft with bridging callus now  evident. Healing proximal fibular fracture, similar alignment with 1 shaft diameter of medial displacement of distal fracture fragment and mild overriding. Bridging callus also evident IMPRESSION: 1. No significant change in alignment of healing displaced proximal fibular fracture 2. No significant change in alignment of healing mid tibial fracture status post intramedullary rod and screw fixation. Electronically Signed   By: Donavan Foil M.D.   On: 09/19/2016 22:03   [name] Retroperitoneal (renal,aorta,ivc Nodes)  Result Date: 09/18/2016 CLINICAL DATA:  Acute kidney injury. EXAM: RENAL / URINARY TRACT ULTRASOUND COMPLETE COMPARISON:  Radiographs dated 04/10/2016 FINDINGS: Right Kidney: Length: 11.3 cm. Echogenicity within normal limits. No mass or hydronephrosis visualized. Left Kidney: Length: 10.1 cm. Echogenicity within normal limits. No mass or hydronephrosis visualized. Somewhat limited visualization due to bowel gas causing poor sonic windows. Bladder: Appears normal for degree of bladder distention. Incidental note is made of gallstones in the gallbladder on image 33. IMPRESSION: 1. No specific renal or bladder abnormality is observed. Somewhat limited characterization left kidney due to patient body habitus and poor sonic windows related to bowel positioning. 2. Cholelithiasis. Electronically Signed   By: Van Clines M.D.   On: 09/18/2016 13:06    Scheduled: . aspirin EC  81 mg Oral Daily  . atorvastatin  40 mg Oral q1800  . benztropine  0.5 mg Oral Daily  . bisacodyl  10 mg Rectal Once  . budesonide (PULMICORT) nebulizer solution  0.5 mg Nebulization BID  . divalproex  1,000 mg Oral Daily  .  docusate  100 mg Oral BID  . escitalopram  20 mg Oral Daily  . gabapentin  600 mg Oral TID  . guaiFENesin  600 mg Oral BID  . heparin  5,000 Units Subcutaneous Q8H  . lidocaine  1 patch Transdermal Q24H  . mirtazapine  30 mg Oral QHS  . montelukast  10 mg Oral QHS  . pantoprazole sodium  40 mg  Oral QHS  . polyethylene glycol  17 g Oral Daily  . potassium chloride  20 mEq Oral BID  . tiotropium  18 mcg Inhalation Daily  . torsemide  60 mg Per Tube Daily  . ziprasidone  80 mg Oral QHS     LOS: 12 days   Illyanna Petillo C 09/20/2016,9:36 AM

## 2016-09-20 NOTE — Progress Notes (Signed)
  Throughout this current hospitalization, patient required BiPAP due to increased somnolence, lethargy, and hypercapnia on ABG. Due to her end stage COPD and chronic retaining of CO2, it is in my medical opinion that she would benefit greatly from non invasive ventilation for use at home.  Rory Percy, DO PGY-1, [facility] 09/20/2016 4:18 PM

## 2016-09-21 ENCOUNTER — Telehealth: Payer: Self-pay | Admitting: Internal Medicine

## 2016-09-21 DIAGNOSIS — I5033 Acute on chronic diastolic (congestive) heart failure: Secondary | ICD-10-CM

## 2016-09-21 LAB — BASIC METABOLIC PANEL
Anion gap: 10 (ref 5–15)
BUN: 49 mg/dL — ABNORMAL HIGH (ref 6–20)
CO2: 35 mmol/L — ABNORMAL HIGH (ref 22–32)
Calcium: 9.8 mg/dL (ref 8.9–10.3)
Chloride: 98 mmol/L — ABNORMAL LOW (ref 101–111)
Creatinine, Ser: 1.7 mg/dL — ABNORMAL HIGH (ref 0.44–1.00)
GFR calc Af Amer: 37 mL/min — ABNORMAL LOW (ref >60)
GFR calc non Af Amer: 32 mL/min — ABNORMAL LOW (ref >60)
Glucose, Bld: 113 mg/dL — ABNORMAL HIGH (ref 65–99)
Potassium: 3.1 mmol/L — ABNORMAL LOW (ref 3.5–5.1)
Sodium: 143 mmol/L (ref 135–145)

## 2016-09-21 LAB — GLUCOSE, CAPILLARY
Glucose-Capillary: 104 mg/dL — ABNORMAL HIGH (ref 65–99)
Glucose-Capillary: 92 mg/dL (ref 65–99)

## 2016-09-21 LAB — MAGNESIUM: Magnesium: 2 mg/dL (ref 1.7–2.4)

## 2016-09-21 MED ORDER — POTASSIUM CHLORIDE CRYS ER 20 MEQ PO TBCR
40.0000 meq | EXTENDED_RELEASE_TABLET | Freq: Once | ORAL | Status: AC
  Administered 2016-09-21: 40 meq via ORAL
  Filled 2016-09-21: qty 2

## 2016-09-21 MED ORDER — TORSEMIDE 20 MG PO TABS: 60.0000 mg | ORAL_TABLET | Freq: Every day | ORAL | 0 refills | Status: DC

## 2016-09-21 MED ORDER — ATORVASTATIN CALCIUM 40 MG PO TABS: ORAL_TABLET | ORAL | 3 refills | Status: DC

## 2016-09-21 MED ORDER — POTASSIUM CHLORIDE CRYS ER 20 MEQ PO TBCR: 20.0000 meq | EXTENDED_RELEASE_TABLET | Freq: Two times a day (BID) | ORAL | Status: DC

## 2016-09-21 MED ORDER — POTASSIUM CHLORIDE CRYS ER 20 MEQ PO TBCR: 20.0000 meq | EXTENDED_RELEASE_TABLET | Freq: Two times a day (BID) | ORAL | 2 refills | Status: DC

## 2016-09-21 NOTE — Progress Notes (Signed)
The patient has been given discharge instructions along with a new medication list and what to take today. She has follow up appointments and prescriptions to pick up. [name] discharging via [facility] transport.  Saddie Benders RN

## 2016-09-21 NOTE — Progress Notes (Signed)
PT Cancellation Note  Patient Details Name: Tiffany [name] MRN: [id number] DOB: 06-30-1960   Cancelled Treatment:    Reason Eval/Treat Not Completed: Other (comment). Pt adamantly refusing any mobility today. States her left heel hurts. Asked pt if she wanted to perform a sliding board or scoot transfer to the chair to reduce pressure on heel. Pt declined. Pt has performed these type transfers at home during her recovery from fx and should be able to perform now. Pt then stated if she had been using a walker yesterday she wouldn't have fallen. Pointed out to pt she had not been using a walker prior to yesterday to perform these transfer without problems. We had just used a walker to amb yesterday for the first time since her weight bearing had been increased. Continue to feel that patient can perform transfers at home as she was prior to admission with HHPT follow up.   Shary Decamp [place] 09/21/2016, 9:57 AM Suanne Marker PT [phone]

## 2016-09-21 NOTE — Discharge Instructions (Signed)
You presented with shortness of breath and needed CPR after you became unresponsive. You remained on the ventilator for a few days and transitioned to BiPAP that you needed only when sleeping.  Dr. Erlinda Hong came to see you in the hospital and cleared you to bear weight on your R leg that you had previously broken.  We also did an xray of your left ankle after you fell in the hospital and it was not broken.  You will follow with Dr. Erlinda Hong and pulmonology outpatient and be provided with a machine to help you breathe at home.  Home Health will come by and make sure you understand how to use your machine.  You will also continue to need PT once at home.

## 2016-09-21 NOTE — Progress Notes (Signed)
Impression: 1. Acute on CKD3 - baseline creat 1- 1.5. s/parrest and CHF (diast). CKD stable, will sign off 2. OSA w/ severehypoventilation 3. Morbid obesity 4. SP PEA arrest 5. Diast CHF 6. Hx acute cholecystitis - sp chole tube 7. Bipolar/ chronic schizophrenia 8. Pyuria 9. Hypokalemia, replace  Subjective: Interval History: Ok, good UOP  Objective: Vital signs in last 24 hours: Temp:  [97.5 F (36.4 C)-98.9 F (37.2 C)] 97.5 F (36.4 C) (09/06 0700) Pulse Rate:  [60-74] 60 (09/06 0455) Resp:  [14-32] 19 (09/06 0455) BP: (108-139)/(58-89) 114/66 (09/06 0455) SpO2:  [97 %-100 %] 99 % (09/06 0455) FiO2 (%):  [30 %-40 %] 30 % (09/06 0455) Weight:  [129.6 kg (285 lb 11.2 oz)] 129.6 kg (285 lb 11.2 oz) (09/06 0348) Weight change: -2.812 kg (-6 lb 3.2 oz)  Intake/Output from previous day: 09/05 0701 - 09/06 0700 In: 480 [P.O.:480] Out: 1900 [Urine:1900] Intake/Output this shift: No intake/output data recorded.  General appearance: alert and cooperative Resp: clear to auscultation bilaterally Chest wall: right ant cw tenderness GI: soft, non-tender; bowel sounds normal; no masses,  no organomegaly  Lab Results: No results for input(s): WBC, HGB, HCT, PLT in the last 72 hours. BMET:  Recent Labs  09/20/16 1548 09/21/16 0334  NA 145 143  K 3.8 3.1*  CL 98* 98*  CO2 35* 35*  GLUCOSE 92 113*  BUN 53* 49*  CREATININE 1.56* 1.70*  CALCIUM 9.8 9.8   No results for input(s): PTH in the last 72 hours. Iron Studies: No results for input(s): IRON, TIBC, TRANSFERRIN, FERRITIN in the last 72 hours. Studies/Results: Dg Tibia/fibula Right  Result Date: 09/19/2016 CLINICAL DATA:  History of ORIF EXAM: RIGHT TIBIA AND FIBULA - 2 VIEW COMPARISON:  08/01/2016, 07/12/2016 FINDINGS: Status post intramedullary rod and screw fixation of the tibia across a healing comminuted fracture involving the midshaft with bridging callus now evident. Healing proximal fibular fracture,  similar alignment with 1 shaft diameter of medial displacement of distal fracture fragment and mild overriding. Bridging callus also evident IMPRESSION: 1. No significant change in alignment of healing displaced proximal fibular fracture 2. No significant change in alignment of healing mid tibial fracture status post intramedullary rod and screw fixation. Electronically Signed   By: Donavan Foil M.D.   On: 09/19/2016 22:03   Dg Ankle Complete Left  Result Date: 09/20/2016 CLINICAL DATA:  Fall today.  Ankle pain EXAM: LEFT ANKLE COMPLETE - 3+ VIEW COMPARISON:  None. FINDINGS: Negative for fracture or dislocation. Mild degenerative change in the ankle joint. No joint effusion. Mild calcaneal spurring. IMPRESSION: Negative for fracture Electronically Signed   By: Franchot Gallo M.D.   On: 09/20/2016 16:35    Scheduled: . aspirin EC  81 mg Oral Daily  . atorvastatin  40 mg Oral q1800  . benztropine  0.5 mg Oral Daily  . bisacodyl  10 mg Rectal Once  . budesonide (PULMICORT) nebulizer solution  0.5 mg Nebulization BID  . divalproex  1,000 mg Oral Daily  . docusate  100 mg Oral BID  . escitalopram  20 mg Oral Daily  . gabapentin  600 mg Oral TID  . guaiFENesin  600 mg Oral BID  . heparin  5,000 Units Subcutaneous Q8H  . lidocaine  1 patch Transdermal Q24H  . mirtazapine  30 mg Oral QHS  . montelukast  10 mg Oral QHS  . pantoprazole sodium  40 mg Oral QHS  . polyethylene glycol  17 g Oral Daily  .  potassium chloride  20 mEq Oral BID  . tiotropium  18 mcg Inhalation Daily  . torsemide  60 mg Per Tube Daily  . ziprasidone  80 mg Oral QHS        LOS: 13 days   Nichlas Pitera C 09/21/2016,8:40 AM

## 2016-09-21 NOTE — Progress Notes (Signed)
Family Medicine Teaching Service Daily Progress Note Intern Pager: [phone]  Patient name: Tiffany [name] Medical record number: [id number] Date of birth: 02-16-1960 Age: 56 y.o. Gender: female  Primary Care Provider: [name], MD Consultants: CCM (signed off), Nephro (signed off) Code Status: FULL  Pt Overview and Major Events to Date:  8/24 admitted with PEA arrest, intubated 8/24-8/29 - diuresis with IV lasix and zaroxolyn 8/29 - transitioned to home torsemide 9/1 transferred to [place] 9/2 Cr rising, nephro consulted 9/3 requiring use of BiPAP at night  Assessment and Plan:  Acute on Chronic Respiratory failure 2/2 severe, end stage COPD and pulmonary HTN: Worsening. home O2 requirement is 6L [place] and CPAP at night. Concomitant OSA. S/p 5 day course of azithromycin, ceftriaxone > cefepime. Follows with pulm. Home meds for COPD are albuterol, breo ellipta, singulair, spiriva. Patient transitioned from CPAP on 9/2, failed and transitioned to BiPAP at night. Pt retaining CO2 based on ABG and improvement in arousal while on BiPAP. Will d/c to home with BiPAP at night with formal sleep study outpatient. -BiPAP at night -transfer to stepdown -mucinex -restart home spirva, duonebs to PRN -continue pulmicort nebs -overnight pulse ox -sleep study 10/11/16  CHF: echo 09/08/16 with EF 60-65%, normal wall motion. Fluid overloaded on admission, now euvolemic on exam. -continue torsemide daily  AoCKD: Cr baseline appears to be 1-1.5. Acute insult to kidneys on admission likely secondary to hypoperfusion. Cr 2.15 >2.24 >2.12>1.95>1.70. May be new baseline Cr vs ATN from arrest/shock per nephro although trending down. U/A no protein. Not good dialysis candidate per nephro. Continues on torsemide 60mg . No abnormality seen on renal U/S but limited due to body habitus. CKD stable per nephro, signed off.  Chest soreness: patient complained of left upper chest soreness after coughing earlier  this admission. Minimal concern for ACS. No complaints today. -lidocaine patch -continue to monitor  Normocytic Anemia: Hemoglobin 11 03/2016. Drifting down since that time, last Hgb 8.4 9/3. Cannot find any recent anemia workup. No signs of bleeding on exam or history. FOBT negative. Iron panel consistent with ACD.   Hypokalemia: K 3.1. Of note, is on home K of 48meq daily. Consider restarting at discharge if requires additional supplementation. Received KCl 64meq last night.  Hyperphosphatemia, normalized: Phos 5.1 on 8/29 > 5.4 > 4.6. Likely due to acute phosphate load with CPR and hypoperfusion. May be related to AoCKD.   DMII: last a1c 07/05/16 5.6. At home on metformin 500mg  BID. Minimal sliding scale use this admission. CBG this am 110. -discontinue moderate sliding scale -daily CBGs with BMP  Schizophrenia: stable. home meds include cogentin 0.5mg , depakote 500mg  QD, remeron 45mg  daily, geodon. -continue cogentin, remeron, lexapro, depakote, geodon  History of cocaine abuse: UDS negative on admission. -monitor as clinically indicated  Diabetic neuropathy: home gabapentin is 600mg  TID.  -restart home gabapentin   Closed fracture of right tibia: last note from Dr. Erlinda Hong suggesting WBAT.  -continue outpatient ortho follow up in 6 wks -PT/OT - rec home health PT and rolling walker -wean CAM boot as tolerated  Witnessed fall Witnessed fall yesterday on buttocks when transitioning from bedside commode to bed. Patient guided to sit on buttocks. No injuries or changes in MS or VS. Complained of ankle pain a few hours after event. Xray negative. - PT reeval  FEN/GI: carb mod diet PPx: heparin subq given AKI  Disposition: continued inpatient management of AKI, respiratory status, volume status, and anemia  Subjective:  Patient had witnessed fall yesterday when  transitioning to bedside commode to the bed with no immediate injuries or changes in mental status or vital signs, stating her  leg "gave out." She did have L ankle pain yesterday a few hours after this event and was examined. She maintained full ROM of her L foot but did have slight tenderness to palpation over the medial malleolus and subsequently received imaging of her left foot to rule out fracture.  Patient had a good night and slept well on BiPAP. She complained of slight ankle pain today but endorses no other concerns.  Objective: Temp:  [97.5 F (36.4 C)-98.9 F (37.2 C)] 97.5 F (36.4 C) (09/06 0700) Pulse Rate:  [60-74] 60 (09/06 0455) Resp:  [14-32] 19 (09/06 0455) BP: (108-139)/(58-89) 114/66 (09/06 0455) SpO2:  [97 %-100 %] 99 % (09/06 0455) FiO2 (%):  [30 %-40 %] 30 % (09/06 0455) Weight:  [285 lb 11.2 oz (129.6 kg)] 285 lb 11.2 oz (129.6 kg) (09/06 0348)   Net -779ml.  Physical Exam: General: Obese female lying in bed in NAD, awake and alert  Cardiovascular: RRR, no murmur Respiratory: distant lung sounds likely due to body habitus,CTA bilaterally.  Abdomen: soft, protuberant, nontender +BS Extremities: no edema, warm, SCDs in place, CAM boot on R foot. Full ROM of L ankle, slight tenderness to palpation.  Scheduled Meds: . aspirin EC  81 mg Oral Daily  . atorvastatin  40 mg Oral q1800  . benztropine  0.5 mg Oral Daily  . bisacodyl  10 mg Rectal Once  . budesonide (PULMICORT) nebulizer solution  0.5 mg Nebulization BID  . divalproex  1,000 mg Oral Daily  . docusate  100 mg Oral BID  . escitalopram  20 mg Oral Daily  . gabapentin  600 mg Oral TID  . guaiFENesin  600 mg Oral BID  . heparin  5,000 Units Subcutaneous Q8H  . lidocaine  1 patch Transdermal Q24H  . mirtazapine  30 mg Oral QHS  . montelukast  10 mg Oral QHS  . pantoprazole sodium  40 mg Oral QHS  . polyethylene glycol  17 g Oral Daily  . potassium chloride  20 mEq Oral BID  . tiotropium  18 mcg Inhalation Daily  . torsemide  60 mg Per Tube Daily  . ziprasidone  80 mg Oral QHS   Continuous Infusions: . sodium chloride  Stopped (09/16/16 1900)   PRN Meds:.sodium chloride, hydrALAZINE, ipratropium-albuterol  Laboratory:  Recent Labs Lab 09/18/16 0244  WBC 10.4  HGB 8.4*  HCT 28.7*  PLT 620*    Recent Labs Lab 09/19/16 0322 09/20/16 1548 09/21/16 0334  NA 142 145 143  K 3.1* 3.8 3.1*  CL 95* 98* 98*  CO2 35* 35* 35*  BUN 79* 53* 49*  CREATININE 1.95* 1.56* 1.70*  CALCIUM 9.9 9.8 9.8  GLUCOSE 101* 92 113*   ABG    Component Value Date/Time   PHART 7.373 09/18/2016 1136   PCO2ART 62.5 (H) 09/18/2016 1136   PO2ART 127 (H) 09/18/2016 1136   HCO3 35.7 (H) 09/18/2016 1136   TCO2 33 (H) 09/08/2016 0652   ACIDBASEDEF 0.5 06/24/2015 1510   O2SAT 98.7 09/18/2016 1136    Imaging/Diagnostic Tests:  L ankle xray 09/05 FINDINGS: Negative for fracture or dislocation. Mild degenerative change in the ankle joint. No joint effusion. Mild calcaneal spurring. IMPRESSION: Negative for fracture   R Tib/Fib xray 09/04 IMPRESSION: 1. No significant change in alignment of healing displaced proximal fibular fracture 2. No significant change in alignment of healing  mid tibial fracture status post intramedullary rod and screw fixation.  Renal US 09/03 IMPRESSION: 1. No specific renal or bladder abnormality is observed. Somewhat limited characterization left kidney due to patient body habitus and poor sonic windows related to bowel positioning. 2. Cholelithiasis.  Rory Percy, DO 09/21/2016, 9:25 AM PGY-1, [place]

## 2016-09-21 NOTE — Telephone Encounter (Addendum)
Called patient who is currently in the hospital. She had a cardiac arrest, apparently while driving home from her appointment, her oxygen ran out. She states EMS was called and took her home, however she never fully recovered from being off her oxygen. She then went home developed worsening SOB and had a cardiac arrest. She states now she is feeling significantly better and feels ready to go home. She plans to follow up with me after discharge from the hospital.

## 2016-09-22 ENCOUNTER — Other Ambulatory Visit: Payer: Self-pay | Admitting: *Deleted

## 2016-09-22 NOTE — Telephone Encounter (Signed)
Spoke with the [facility], they have an appointment for 9/18 at 8pm. I called the pt to see if she could make this appt and she agreed but wanted me to call her back to remind her later because she didn't have anything to write with. I will call her before I leave today.

## 2016-09-22 NOTE — Patient Outreach (Signed)
[facility]) Care Management  09/22/2016  ABIGAILE ROSSIE 1960/09/12 [id number]   Call placed to member for transition of care as she was discharged yesterday after being treated after cardiac arrest.  She report she is much better, denies shortness of breath at this time.  She state she is on her regular nasal cannula during the day at 6 liters, on Trilogy home ventilator when sleeping and at night.  State she was told not wearing it could result in death.  She report compliance, denies questions.  She has advanced home care still active, has not been contacted by them to restart services yet.  She report compliance with medications, denies changes.  She agrees to home visit next week, will review medications in the home at that time.  She denies any questions at this time, advised to contact with concerns.  Will follow up next week with home visit.   [name], [place], MSN [place] [phone]

## 2016-09-23 ENCOUNTER — Other Ambulatory Visit (HOSPITAL_COMMUNITY): Payer: Self-pay | Admitting: Psychiatry

## 2016-09-23 DIAGNOSIS — F200 Paranoid schizophrenia: Secondary | ICD-10-CM

## 2016-09-24 DIAGNOSIS — J439 Emphysema, unspecified: Secondary | ICD-10-CM | POA: Diagnosis not present

## 2016-09-24 DIAGNOSIS — I5032 Chronic diastolic (congestive) heart failure: Secondary | ICD-10-CM | POA: Diagnosis not present

## 2016-09-24 DIAGNOSIS — I130 Hypertensive heart and chronic kidney disease with heart failure and stage 1 through stage 4 chronic kidney disease, or unspecified chronic kidney disease: Secondary | ICD-10-CM | POA: Diagnosis not present

## 2016-09-24 DIAGNOSIS — J45909 Unspecified asthma, uncomplicated: Secondary | ICD-10-CM | POA: Diagnosis not present

## 2016-09-24 DIAGNOSIS — E1122 Type 2 diabetes mellitus with diabetic chronic kidney disease: Secondary | ICD-10-CM | POA: Diagnosis not present

## 2016-09-24 DIAGNOSIS — J440 Chronic obstructive pulmonary disease with acute lower respiratory infection: Secondary | ICD-10-CM | POA: Diagnosis not present

## 2016-09-24 DIAGNOSIS — S82401D Unspecified fracture of shaft of right fibula, subsequent encounter for closed fracture with routine healing: Secondary | ICD-10-CM | POA: Diagnosis not present

## 2016-09-24 DIAGNOSIS — E1140 Type 2 diabetes mellitus with diabetic neuropathy, unspecified: Secondary | ICD-10-CM | POA: Diagnosis not present

## 2016-09-24 DIAGNOSIS — G4733 Obstructive sleep apnea (adult) (pediatric): Secondary | ICD-10-CM | POA: Diagnosis not present

## 2016-09-24 DIAGNOSIS — J189 Pneumonia, unspecified organism: Secondary | ICD-10-CM | POA: Diagnosis not present

## 2016-09-24 DIAGNOSIS — R1310 Dysphagia, unspecified: Secondary | ICD-10-CM | POA: Diagnosis not present

## 2016-09-24 DIAGNOSIS — S82201D Unspecified fracture of shaft of right tibia, subsequent encounter for closed fracture with routine healing: Secondary | ICD-10-CM | POA: Diagnosis not present

## 2016-09-24 DIAGNOSIS — R062 Wheezing: Secondary | ICD-10-CM | POA: Diagnosis not present

## 2016-09-24 DIAGNOSIS — R0602 Shortness of breath: Secondary | ICD-10-CM | POA: Diagnosis not present

## 2016-09-24 DIAGNOSIS — N189 Chronic kidney disease, unspecified: Secondary | ICD-10-CM | POA: Diagnosis not present

## 2016-09-25 ENCOUNTER — Other Ambulatory Visit (HOSPITAL_COMMUNITY): Payer: Self-pay

## 2016-09-25 ENCOUNTER — Telehealth: Payer: Self-pay | Admitting: *Deleted

## 2016-09-25 ENCOUNTER — Other Ambulatory Visit: Payer: Self-pay | Admitting: *Deleted

## 2016-09-25 DIAGNOSIS — S82401D Unspecified fracture of shaft of right fibula, subsequent encounter for closed fracture with routine healing: Secondary | ICD-10-CM | POA: Diagnosis not present

## 2016-09-25 DIAGNOSIS — I130 Hypertensive heart and chronic kidney disease with heart failure and stage 1 through stage 4 chronic kidney disease, or unspecified chronic kidney disease: Secondary | ICD-10-CM | POA: Diagnosis not present

## 2016-09-25 DIAGNOSIS — I5032 Chronic diastolic (congestive) heart failure: Secondary | ICD-10-CM | POA: Diagnosis not present

## 2016-09-25 DIAGNOSIS — E1122 Type 2 diabetes mellitus with diabetic chronic kidney disease: Secondary | ICD-10-CM | POA: Diagnosis not present

## 2016-09-25 DIAGNOSIS — E1140 Type 2 diabetes mellitus with diabetic neuropathy, unspecified: Secondary | ICD-10-CM | POA: Diagnosis not present

## 2016-09-25 DIAGNOSIS — S82201D Unspecified fracture of shaft of right tibia, subsequent encounter for closed fracture with routine healing: Secondary | ICD-10-CM | POA: Diagnosis not present

## 2016-09-25 DIAGNOSIS — N189 Chronic kidney disease, unspecified: Secondary | ICD-10-CM | POA: Diagnosis not present

## 2016-09-25 DIAGNOSIS — J439 Emphysema, unspecified: Secondary | ICD-10-CM | POA: Diagnosis not present

## 2016-09-25 DIAGNOSIS — R1310 Dysphagia, unspecified: Secondary | ICD-10-CM | POA: Diagnosis not present

## 2016-09-25 DIAGNOSIS — F200 Paranoid schizophrenia: Secondary | ICD-10-CM

## 2016-09-25 MED ORDER — ZIPRASIDONE HCL 80 MG PO CAPS: 80.0000 mg | ORAL_CAPSULE | Freq: Every day | ORAL | 0 refills | Status: DC

## 2016-09-25 MED ORDER — DIVALPROEX SODIUM ER 500 MG PO TB24: 1000.0000 mg | ORAL_TABLET | ORAL | 0 refills | Status: DC

## 2016-09-25 MED ORDER — ESCITALOPRAM OXALATE 20 MG PO TABS: 20.0000 mg | ORAL_TABLET | Freq: Every day | ORAL | 0 refills | Status: DC

## 2016-09-25 NOTE — Patient Outreach (Signed)
[facility]) Care Management   09/25/2016  Tiffany [name] [birth year] [id number]  Tiffany [name] is an 56 y.o. female  Subjective:   Member alert and oriented x3 complains of pain in her left heel/ankle of 3/10 (fell before being discharged from hospital), complains of "soreness" right leg when bearing weight.  She report compliance with medications, including Torsemide.  Patient was recently discharged from hospital and all medications have been reviewed.  Objective:   Review of Systems  Constitutional: Negative.   HENT: Negative.   Eyes: Negative.   Respiratory: Positive for shortness of breath.   Cardiovascular: Positive for leg swelling.  Gastrointestinal: Negative.   Genitourinary: Negative.   Musculoskeletal: Negative.   Skin: Negative.   Neurological: Negative.   Endo/Heme/Allergies: Negative.   Psychiatric/Behavioral: Negative.     Physical Exam  Constitutional: [name] oriented to person, place, and time. She appears well-developed and well-nourished.  Neck: Normal range of motion.  Cardiovascular: Normal rate, regular rhythm and normal heart sounds.   Respiratory: Effort normal and breath sounds normal.  GI: Soft. Bowel sounds are normal.  Musculoskeletal: Normal range of motion.  Neurological: [name] alert and oriented to person, place, and time.  Skin: Skin is warm and dry.   BP 122/86 (BP Location: Left Arm, Patient Position: Sitting, Cuff Size: Normal)   Pulse 84   Resp (!) 22   SpO2 98%   Encounter Medications:   Outpatient Encounter Prescriptions as of 09/25/2016  Medication Sig Note  . albuterol (PROVENTIL) (2.5 MG/3ML) 0.083% nebulizer solution Take 3 mLs (2.5 mg total) by nebulization every 6 (six) hours as needed for wheezing or shortness of breath. 09/08/2016: LF 06-13-16  . Alcohol Swabs (B-D SINGLE USE SWABS REGULAR) PADS CHECK CAPILLARY BLOOD GLUCOSE ONE TIME DAILY   . amLODipine (NORVASC) 5 MG tablet Take 1 tablet (5 mg total) by  mouth daily. 09/08/2016: LF 07-02-16 90DS  . aspirin 81 MG EC tablet Take 1 tablet (81 mg total) by mouth daily. Swallow whole.   [name] atorvastatin (LIPITOR) 40 MG tablet Take 1 tablet (40 mg total) by mouth daily at bedtime   . benztropine (COGENTIN) 0.5 MG tablet Take 1 tablet (0.5 mg total) by mouth daily. 09/08/2016: 06-29-16 28DS  . divalproex ([name] ER) 500 MG 24 hr tablet Take 2 tablets (1,000 mg total) by mouth at bedtime. (Patient taking differently: Take 1,000 mg by mouth every morning. ) 09/08/2016: LF 08-16-16 30DS  . escitalopram (LEXAPRO) 20 MG tablet Take 1 tablet (20 mg total) by mouth daily every morning 09/08/2016: LF 08-16-16 30DS  . fluticasone furoate-vilanterol (BREO ELLIPTA) 100-25 MCG/INH AEPB INHALE 1 PUFF EVERY DAY   . gabapentin (NEURONTIN) 600 MG tablet Take 1 tablet (600 mg total) by mouth 3 (three) times daily. 09/08/2016: LF 07-25-16 28DS  . metFORMIN (GLUCOPHAGE-XR) 500 MG 24 hr tablet Take 1 tablet (500 mg total) by mouth 2 (two) times daily. 09/08/2016: LF 07-26-16 28DS  . mirtazapine (REMERON) 30 MG tablet Take 30 mg by mouth at bedtime. 09/08/2016: LF 08-18-16 30DS  . montelukast (SINGULAIR) 10 MG tablet Take 1 tablet (10 mg total) by mouth at bedtime. 09/08/2016: LF 07-26-16 28DS  . Multiple Vitamin (MULTIVITAMIN WITH MINERALS) TABS tablet Take 1 tablet by mouth daily.   [name] omeprazole (PRILOSEC) 40 MG capsule Take 1 capsule (40 mg total) by mouth 2 (two) times daily. 09/08/2016: LF 07-25-16 28DS  . OXYGEN Inhale 6 L into the lungs daily.   . potassium chloride SA (  K-DUR,KLOR-CON) 20 MEQ tablet Take 1 tablet (20 mEq total) by mouth 2 (two) times daily.   [name] tiotropium (SPIRIVA HANDIHALER) 18 MCG inhalation capsule Place 1 capsule (18 mcg total) into inhaler and inhale daily. 09/08/2016: LF 08-10-16 90DS  . torsemide (DEMADEX) 20 MG tablet Take 3 tablets (60 mg total) by mouth daily.   Enid Cutter HFA 108 (90 Base) MCG/ACT inhaler Inhale 2 puffs into the lungs every 6 (six) hours as needed  for wheezing or shortness of breath.  09/08/2016: LF 06-01-16 30DS  . ziprasidone (GEODON) 80 MG capsule Take 1 capsule (80 mg total) by mouth at bedtime. 09/08/2016: LF 08-14-16 30DS  . Blood Glucose Monitoring Suppl (ACCU-CHEK AVIVA PLUS) w/Device KIT CHECK BLOOD SUGAR THREE TIMES DAILY (Patient not taking: Reported on 09/25/2016)   . glucose blood test strip Use to check blood sugar 3 times daily. (Patient not taking: Reported on 09/25/2016)    No facility-administered encounter medications on file as of 09/25/2016.     Functional Status:   In your present state of health, do you have any difficulty performing the following activities: 09/15/2016 09/10/2016  Hearing? N N  Vision? N N  Difficulty concentrating or making decisions? N Y  Walking or climbing stairs? Y Y  Dressing or bathing? N Y  Doing errands, shopping? Y -  Some recent data might be hidden    Fall/Depression Screening:    Fall Risk  08/23/2016 07/27/2016 06/20/2016  Falls in the past year? Yes Yes No  Number falls in past yr: 2 or more 2 or more -  Injury with Fall? Yes Yes -  Risk Factor Category  High Fall Risk High Fall Risk -  Risk for fall due to : History of fall(s) Impaired balance/gait;Impaired mobility;Impaired vision -  Follow up Falls prevention discussed;Education provided - -   PHQ 2/9 Scores 09/07/2016 08/21/2016 07/27/2016 07/05/2016 06/20/2016 06/14/2016 06/06/2016  PHQ - 2 Score 0 0 0 0 0 0 0  PHQ- 9 Score - - - - - - -  Some encounter information is confidential and restricted. Go to Review Flowsheets activity to see all data.    Assessment:    Met with member at scheduled time.  Current health status discussed with member as she has been hospitalized several times in the past 6 months for multiple reasons (heart failure, falls with injury requiring surgical intervention, respiratory failure, and cardiac arrest).  She state "I know, I dodged another bullet."  Per mother, she continue to be noncompliant with diet and  health plan even after this recent discharge.  Importance of adhering to plan or care and diet stressed, complications discussed to include death.  She verbalizes understanding and state "I know I have to do better."    Member continue to have home health for nursing and physical therapy.  She has follow up appointment with primary MD office tomorrow, [facility] transportation already secure.  She denies any urgent concerns at this time.  Plan:   Will follow up next week with weekly transition of care call.  [facility] CM Care Plan Problem One     Most Recent Value  Care Plan Problem One  Risk for hospital readmission related to recent admission for cardiac arrest  Role Documenting the Problem One  Care Management [place] for Problem One  Active  [facility] Long Term Goal   Patient will remain free from hospital readmision within the next 31 days  [facility] Long Term Goal Start Date  09/22/16  Interventions for Problem One Long Term Goal  Verified that member has and understand discharge instructions.  Advised of the importance of following instructions to decrease risk of readmisison  TH[facility] CM Short Term Goal #1   Patient will attend all follow up appointments as sceduled within the next 30 days  [facility] CM Short Term Goal #1 Start Date  09/22/16  Interventions for Short Term Goal #1  Confirmed that member has follow up appointment scheudled for primary MD.  Confirmed that member will have transportation (Will use [facility]).   [facility] CM Short Term Goal #2   Patient will take all medications as prescribed for the next 30 days  [facility] CM Short Term Goal #2 Start Date  09/22/16  Interventions for Short Term Goal #2  Medication list reviewed, advised of importance of taking as prescribed.  Informed [facility] pharmacist of discharge from [place], RN, MSN [place] Manager [phone]

## 2016-09-25 NOTE — Telephone Encounter (Signed)
Please provide verbal orders for patient. Thanks Alliene Klugh

## 2016-09-25 NOTE — Telephone Encounter (Signed)
Tiffany [name] needs verbal orders from Dr. Emmaline Life for pallitive care consult on patient.  Please call her with answer.  Luca Burston, [name], CMA

## 2016-09-26 ENCOUNTER — Ambulatory Visit (INDEPENDENT_AMBULATORY_CARE_PROVIDER_SITE_OTHER): Payer: Medicare HMO | Admitting: Internal Medicine

## 2016-09-26 ENCOUNTER — Encounter: Payer: Self-pay | Admitting: Internal Medicine

## 2016-09-26 ENCOUNTER — Other Ambulatory Visit: Payer: Self-pay | Admitting: *Deleted

## 2016-09-26 DIAGNOSIS — I5032 Chronic diastolic (congestive) heart failure: Secondary | ICD-10-CM

## 2016-09-26 DIAGNOSIS — J9612 Chronic respiratory failure with hypercapnia: Secondary | ICD-10-CM

## 2016-09-26 DIAGNOSIS — G894 Chronic pain syndrome: Secondary | ICD-10-CM | POA: Diagnosis not present

## 2016-09-26 DIAGNOSIS — J9611 Chronic respiratory failure with hypoxia: Secondary | ICD-10-CM | POA: Diagnosis not present

## 2016-09-26 NOTE — Patient Outreach (Signed)
[facility]) Care Management  09/26/2016  Tiffany [name] 09/08/1960 [id number]   Late Entry from 8/24:  Noted that member was admitted to hospital after cardiac arrest.  Hospital liaisons notified, follow up planned for after discharge.  [name], [place], MSN [place] [phone]

## 2016-09-26 NOTE — Assessment & Plan Note (Signed)
Stable since hospital discharge. O2 sat 98% on home 6L [place] upon clinic presentation. Reports tolerating BiPAP well at night. Has been taking torsemide as prescribed daily. Lung exam difficult given habitus and positioning, however no obvious abnormalities and patient able to speak easily in full sentences.  - F/u with PCP in about a month

## 2016-09-26 NOTE — Patient Instructions (Signed)
It was nice seeing you again today Tiffany [name]!  Please continue taking your torsemide daily as you have been. Also continue to use BiPAP every night.   It is important to keep your follow-up appointments with your specialists, including your appointment with Dr. Randell Patient this month to discuss whether you should continue taking Cogentin or not.   You can continue to take Tylenol for pain. I do NOT recommend using ibuprofen and/or naproxen, as this can worsen your heart failure.   Dr. Emmaline Life will see you back for a follow up appointment in a few of weeks.   If you have any questions or concerns, please feel free to call the clinic.   Be well,  Dr. Avon Gully

## 2016-09-26 NOTE — Assessment & Plan Note (Signed)
Reports good compliance with daily torsemide since hospital discharge. Minimal LE edema on exam today.  - Continue torsemide qd - F/u with PCP in about a month

## 2016-09-26 NOTE — Telephone Encounter (Signed)
Verbal orders given  

## 2016-09-26 NOTE — Assessment & Plan Note (Signed)
Reporting increased soreness after hospital admission. Discussed that she is likely to have soreness after extubation, and in general will take some time to heal from her difficult hospital stay. Discussed that taking naproxen or any NSAIDs is not ideal for her given her heart failure and fluctuating renal function. Also not good candidate for opiates given polypharmacy and already poor respiratory status, as well as recent history of cocaine abuse. Encouraged patient to continue taking Tylenol alone and give herself time to heal. Patient insistent that she will not continue to take Tylenol and will resume naproxen instead. Again, discussed dangers of taking naproxen given her cardiac conditions, and documented this in her AVS as well. Patient voiced understanding but said she would be taking it anyway.

## 2016-09-26 NOTE — Patient Outreach (Signed)
[facility]) Care Management  09/26/2016  Tiffany [name] [birth year] [id number]   [name] attempted to reach patient to follow-up on community resources, but no answer. [name] was unable to leave a voicemail as her mailbox was full. [name] will try again within a week.    Raynaldo Opitz, LCSW [facility]  Clinical Social Worker cell #: [phone]

## 2016-09-26 NOTE — Progress Notes (Signed)
Subjective:   Patient: Tiffany [name]       Birthdate: [birth year]       MRN: [id number]      HPI  Tiffany [name] is a 56 y.o. female presenting for hospital f/u.   Patient recently admitted from 08/24-09/05 for respiratory failure s/p PEA arrest requiring intubation. It was thought that her respiratory failure occurred after not taking her Lasix, as CXR upon admission was consistent with pulmonary edema. She was intubated for 6 days while diuresed with IV Lasix and Zaroxolyn. After transitioning to diuresis with home torsemide, she was extubated and transferred to family medicine inpatient service. She was able to maintain appropriate O2 sats while awake on her home 6L [place], however at night was noted to be lethargic and once unresponsive on this, so was started on BiPAP at night. She continued to improve throughout admission and was discharged about a week ago.  Since discharge, patient reports a chest soreness and general body soreness. She has been taking Tylenol but says this doesn't help much. Has taken naproxen in the past and says she wants to take this again. Says that she is breathing well on 6L [place] during the day and is wearing BiPAP without any issues at night. Says she has been taking her torsemide every single day as prescribed and has not missed any doses since discharge. HH, PT, and OT have all been set up already per patient's report.  Of note, patient refused Cogentin during admission as she thought it was causing tremors. She has not resumed this medication. Says it is prescribed to her by Dr. Randell Patient with whom she has an appointment this month and will discuss it with him then.   Smoking status reviewed. Patient is former smoker.   Review of Systems See HPI.     Objective:  Physical Exam  Constitutional: [name] oriented to person, place, and time.  Obese, chronically ill appearing female sitting in wheelchair  HENT:  Head: Normocephalic and atraumatic.  Cardiovascular:  Normal rate, regular rhythm and normal heart sounds.   No murmur heard. Pulmonary/Chest:  Spray in place. Exam limited due to body habitus and patient positioning in wheelchair, however no obvious wheezes or crackles on auscultation. Patient speaking in full sentences with normal WOB on home 6L [place].   Musculoskeletal:  Trace pitting edema bilateral LE to mid shin. R ankle with ACE wrap applied. L foot in boot.    Neurological: [name] alert and oriented to person, place, and time.  Skin: Skin is warm and dry.  Psychiatric: Affect and judgment normal.       Assessment & Plan:  Chronic pain syndrome Reporting increased soreness after hospital admission. Discussed that she is likely to have soreness after extubation, and in general will take some time to heal from her difficult hospital stay. Discussed that taking naproxen or any NSAIDs is not ideal for her given her heart failure and fluctuating renal function. Also not good candidate for opiates given polypharmacy and already poor respiratory status, as well as recent history of cocaine abuse. Encouraged patient to continue taking Tylenol alone and give herself time to heal. Patient insistent that she will not continue to take Tylenol and will resume naproxen instead. Again, discussed dangers of taking naproxen given her cardiac conditions, and documented this in her AVS as well. Patient voiced understanding but said she would be taking it anyway.   Chronic respiratory failure with hypoxia and hypercapnia (HCC) Stable since hospital discharge.  O2 sat 98% on home 6L [place] upon clinic presentation. Reports tolerating BiPAP well at night. Has been taking torsemide as prescribed daily. Lung exam difficult given habitus and positioning, however no obvious abnormalities and patient able to speak easily in full sentences.  - F/u with PCP in about a month  Chronic diastolic heart failure ([place]) Reports good compliance with daily torsemide since hospital  discharge. Minimal LE edema on exam today.  - Continue torsemide qd - F/u with PCP in about a month   Adin Hector, MD, MPH PGY-3 [name] Pager [phone]

## 2016-09-27 DIAGNOSIS — J439 Emphysema, unspecified: Secondary | ICD-10-CM | POA: Diagnosis not present

## 2016-09-27 DIAGNOSIS — E1140 Type 2 diabetes mellitus with diabetic neuropathy, unspecified: Secondary | ICD-10-CM | POA: Diagnosis not present

## 2016-09-27 DIAGNOSIS — S82401D Unspecified fracture of shaft of right fibula, subsequent encounter for closed fracture with routine healing: Secondary | ICD-10-CM | POA: Diagnosis not present

## 2016-09-27 DIAGNOSIS — I5032 Chronic diastolic (congestive) heart failure: Secondary | ICD-10-CM | POA: Diagnosis not present

## 2016-09-27 DIAGNOSIS — N189 Chronic kidney disease, unspecified: Secondary | ICD-10-CM | POA: Diagnosis not present

## 2016-09-27 DIAGNOSIS — E1122 Type 2 diabetes mellitus with diabetic chronic kidney disease: Secondary | ICD-10-CM | POA: Diagnosis not present

## 2016-09-27 DIAGNOSIS — R1310 Dysphagia, unspecified: Secondary | ICD-10-CM | POA: Diagnosis not present

## 2016-09-27 DIAGNOSIS — I130 Hypertensive heart and chronic kidney disease with heart failure and stage 1 through stage 4 chronic kidney disease, or unspecified chronic kidney disease: Secondary | ICD-10-CM | POA: Diagnosis not present

## 2016-09-27 DIAGNOSIS — S82201D Unspecified fracture of shaft of right tibia, subsequent encounter for closed fracture with routine healing: Secondary | ICD-10-CM | POA: Diagnosis not present

## 2016-09-28 ENCOUNTER — Other Ambulatory Visit (HOSPITAL_COMMUNITY): Payer: Self-pay | Admitting: Psychiatry

## 2016-09-28 DIAGNOSIS — I130 Hypertensive heart and chronic kidney disease with heart failure and stage 1 through stage 4 chronic kidney disease, or unspecified chronic kidney disease: Secondary | ICD-10-CM | POA: Diagnosis not present

## 2016-09-28 DIAGNOSIS — J439 Emphysema, unspecified: Secondary | ICD-10-CM | POA: Diagnosis not present

## 2016-09-28 DIAGNOSIS — N189 Chronic kidney disease, unspecified: Secondary | ICD-10-CM | POA: Diagnosis not present

## 2016-09-28 DIAGNOSIS — F200 Paranoid schizophrenia: Secondary | ICD-10-CM

## 2016-09-28 DIAGNOSIS — I5032 Chronic diastolic (congestive) heart failure: Secondary | ICD-10-CM | POA: Diagnosis not present

## 2016-09-28 DIAGNOSIS — S82401D Unspecified fracture of shaft of right fibula, subsequent encounter for closed fracture with routine healing: Secondary | ICD-10-CM | POA: Diagnosis not present

## 2016-09-28 DIAGNOSIS — E1140 Type 2 diabetes mellitus with diabetic neuropathy, unspecified: Secondary | ICD-10-CM | POA: Diagnosis not present

## 2016-09-28 DIAGNOSIS — R0602 Shortness of breath: Secondary | ICD-10-CM | POA: Diagnosis not present

## 2016-09-28 DIAGNOSIS — E1122 Type 2 diabetes mellitus with diabetic chronic kidney disease: Secondary | ICD-10-CM | POA: Diagnosis not present

## 2016-09-28 DIAGNOSIS — S82201D Unspecified fracture of shaft of right tibia, subsequent encounter for closed fracture with routine healing: Secondary | ICD-10-CM | POA: Diagnosis not present

## 2016-09-28 DIAGNOSIS — R1310 Dysphagia, unspecified: Secondary | ICD-10-CM | POA: Diagnosis not present

## 2016-09-28 NOTE — Telephone Encounter (Signed)
Pt's split night sleep study was changed to 10/04/16 by Chantel at [facility] due to 10/03/16 being full. Will contact pt with the results of the sleep study once we get them. Nothing further needed at this time.

## 2016-09-29 DIAGNOSIS — S82201D Unspecified fracture of shaft of right tibia, subsequent encounter for closed fracture with routine healing: Secondary | ICD-10-CM | POA: Diagnosis not present

## 2016-09-29 DIAGNOSIS — I130 Hypertensive heart and chronic kidney disease with heart failure and stage 1 through stage 4 chronic kidney disease, or unspecified chronic kidney disease: Secondary | ICD-10-CM | POA: Diagnosis not present

## 2016-09-29 DIAGNOSIS — R1310 Dysphagia, unspecified: Secondary | ICD-10-CM | POA: Diagnosis not present

## 2016-09-29 DIAGNOSIS — I5032 Chronic diastolic (congestive) heart failure: Secondary | ICD-10-CM | POA: Diagnosis not present

## 2016-09-29 DIAGNOSIS — J439 Emphysema, unspecified: Secondary | ICD-10-CM | POA: Diagnosis not present

## 2016-09-29 DIAGNOSIS — E1140 Type 2 diabetes mellitus with diabetic neuropathy, unspecified: Secondary | ICD-10-CM | POA: Diagnosis not present

## 2016-09-29 DIAGNOSIS — S82401D Unspecified fracture of shaft of right fibula, subsequent encounter for closed fracture with routine healing: Secondary | ICD-10-CM | POA: Diagnosis not present

## 2016-09-29 DIAGNOSIS — E1122 Type 2 diabetes mellitus with diabetic chronic kidney disease: Secondary | ICD-10-CM | POA: Diagnosis not present

## 2016-09-29 DIAGNOSIS — N189 Chronic kidney disease, unspecified: Secondary | ICD-10-CM | POA: Diagnosis not present

## 2016-10-02 ENCOUNTER — Other Ambulatory Visit: Payer: Self-pay | Admitting: *Deleted

## 2016-10-02 DIAGNOSIS — M6281 Muscle weakness (generalized): Secondary | ICD-10-CM | POA: Diagnosis not present

## 2016-10-02 DIAGNOSIS — J440 Chronic obstructive pulmonary disease with acute lower respiratory infection: Secondary | ICD-10-CM | POA: Diagnosis not present

## 2016-10-02 DIAGNOSIS — I509 Heart failure, unspecified: Secondary | ICD-10-CM | POA: Diagnosis not present

## 2016-10-02 DIAGNOSIS — J449 Chronic obstructive pulmonary disease, unspecified: Secondary | ICD-10-CM | POA: Diagnosis not present

## 2016-10-02 DIAGNOSIS — R062 Wheezing: Secondary | ICD-10-CM | POA: Diagnosis not present

## 2016-10-02 DIAGNOSIS — J45909 Unspecified asthma, uncomplicated: Secondary | ICD-10-CM | POA: Diagnosis not present

## 2016-10-02 DIAGNOSIS — G4733 Obstructive sleep apnea (adult) (pediatric): Secondary | ICD-10-CM | POA: Diagnosis not present

## 2016-10-02 DIAGNOSIS — J189 Pneumonia, unspecified organism: Secondary | ICD-10-CM | POA: Diagnosis not present

## 2016-10-02 DIAGNOSIS — R0602 Shortness of breath: Secondary | ICD-10-CM | POA: Diagnosis not present

## 2016-10-02 NOTE — Patient Outreach (Signed)
[facility]) Care Management  10/02/2016  Tiffany [name] 1960-05-14 [id number]   [name] made a second attempt to try and reach patient today to follow-up on [name] resources/medicaid application without success. A HIPPA compliant message was left for patient on voicemail (ph#: [phone]). [name] is currently awaiting a return call, and will have CMA mail unsuccessful outreach letter to patient. [name] will make a third outreach attempt within the next week, if [name] does not receive a return call from patient in the meantime.    Raynaldo Opitz, LCSW [facility]  Clinical Social Worker cell #: [phone]

## 2016-10-03 ENCOUNTER — Ambulatory Visit: Payer: Medicare HMO | Admitting: Internal Medicine

## 2016-10-03 ENCOUNTER — Telehealth: Payer: Self-pay | Admitting: *Deleted

## 2016-10-03 DIAGNOSIS — S82201D Unspecified fracture of shaft of right tibia, subsequent encounter for closed fracture with routine healing: Secondary | ICD-10-CM | POA: Diagnosis not present

## 2016-10-03 DIAGNOSIS — E1140 Type 2 diabetes mellitus with diabetic neuropathy, unspecified: Secondary | ICD-10-CM | POA: Diagnosis not present

## 2016-10-03 DIAGNOSIS — R1310 Dysphagia, unspecified: Secondary | ICD-10-CM | POA: Diagnosis not present

## 2016-10-03 DIAGNOSIS — I5032 Chronic diastolic (congestive) heart failure: Secondary | ICD-10-CM | POA: Diagnosis not present

## 2016-10-03 DIAGNOSIS — E1122 Type 2 diabetes mellitus with diabetic chronic kidney disease: Secondary | ICD-10-CM | POA: Diagnosis not present

## 2016-10-03 DIAGNOSIS — J439 Emphysema, unspecified: Secondary | ICD-10-CM | POA: Diagnosis not present

## 2016-10-03 DIAGNOSIS — N189 Chronic kidney disease, unspecified: Secondary | ICD-10-CM | POA: Diagnosis not present

## 2016-10-03 DIAGNOSIS — I130 Hypertensive heart and chronic kidney disease with heart failure and stage 1 through stage 4 chronic kidney disease, or unspecified chronic kidney disease: Secondary | ICD-10-CM | POA: Diagnosis not present

## 2016-10-03 DIAGNOSIS — S82401D Unspecified fracture of shaft of right fibula, subsequent encounter for closed fracture with routine healing: Secondary | ICD-10-CM | POA: Diagnosis not present

## 2016-10-03 NOTE — Telephone Encounter (Signed)
[name] needs verbal orders for:  1 visit this week to recertify  After that: 2x week for 1 week 1x week for 2 weeks   Fleeger, [name], CMA

## 2016-10-03 NOTE — Telephone Encounter (Signed)
Please provide verbal orders

## 2016-10-04 ENCOUNTER — Other Ambulatory Visit: Payer: Self-pay | Admitting: *Deleted

## 2016-10-04 ENCOUNTER — Encounter (HOSPITAL_BASED_OUTPATIENT_CLINIC_OR_DEPARTMENT_OTHER): Payer: Medicare HMO

## 2016-10-04 NOTE — Telephone Encounter (Signed)
Left message on voice mail giving verbal orders.  

## 2016-10-04 NOTE — Patient Outreach (Signed)
[facility]) Care Management  10/04/2016  BRANDEY VANDALEN 05-08-1960 [id number]   Weekly transition of care call placed to member.  She report she is doing well, state she was able to walk using walker in her room today with PT.  Report she has received extension on home health therapy for the next few weeks to be able to continue to work on improving strength and mobility.  State she is compliant with medications, but still "struggling" with diet.  Although she verbalizes understanding of the severity of her condition, she is not completely ready to be 100% adherent.  [name] advised on proper management for her chronic conditions (diabetes, COPD, and heart failure), also advised of complications of not adhering to plan of care, including death.   She state she does remember [facility]'s social worker discussing palliative care program, for which she was in favor of referral.  [name] unsure if they have contacted her for a visit, advised that this care manager will contact agency to inquire about progress of referral.    Member has follow up with primary MD and orthopedic MD next week.  She has rescheduled sleep study for next month, stating that she feel she is still requiring too much assistance to attend overnight study alone.  She does report compliance with [facility] system when sleeping day/night in the home.  She denies any urgent concerns at this time, advised to contact with questions.  Will follow up next week.  [name], [place], MSN [place] [phone]

## 2016-10-05 DIAGNOSIS — E1140 Type 2 diabetes mellitus with diabetic neuropathy, unspecified: Secondary | ICD-10-CM | POA: Diagnosis not present

## 2016-10-05 DIAGNOSIS — S82201D Unspecified fracture of shaft of right tibia, subsequent encounter for closed fracture with routine healing: Secondary | ICD-10-CM | POA: Diagnosis not present

## 2016-10-05 DIAGNOSIS — J439 Emphysema, unspecified: Secondary | ICD-10-CM | POA: Diagnosis not present

## 2016-10-05 DIAGNOSIS — R1310 Dysphagia, unspecified: Secondary | ICD-10-CM | POA: Diagnosis not present

## 2016-10-05 DIAGNOSIS — I130 Hypertensive heart and chronic kidney disease with heart failure and stage 1 through stage 4 chronic kidney disease, or unspecified chronic kidney disease: Secondary | ICD-10-CM | POA: Diagnosis not present

## 2016-10-05 DIAGNOSIS — S82401D Unspecified fracture of shaft of right fibula, subsequent encounter for closed fracture with routine healing: Secondary | ICD-10-CM | POA: Diagnosis not present

## 2016-10-05 DIAGNOSIS — N189 Chronic kidney disease, unspecified: Secondary | ICD-10-CM | POA: Diagnosis not present

## 2016-10-05 DIAGNOSIS — E1122 Type 2 diabetes mellitus with diabetic chronic kidney disease: Secondary | ICD-10-CM | POA: Diagnosis not present

## 2016-10-05 DIAGNOSIS — I5032 Chronic diastolic (congestive) heart failure: Secondary | ICD-10-CM | POA: Diagnosis not present

## 2016-10-06 ENCOUNTER — Encounter (HOSPITAL_COMMUNITY): Payer: Self-pay

## 2016-10-06 ENCOUNTER — Ambulatory Visit (INDEPENDENT_AMBULATORY_CARE_PROVIDER_SITE_OTHER): Payer: Medicare HMO | Admitting: Psychiatry

## 2016-10-06 ENCOUNTER — Encounter (HOSPITAL_COMMUNITY): Payer: Self-pay | Admitting: Psychiatry

## 2016-10-06 ENCOUNTER — Emergency Department (HOSPITAL_COMMUNITY): Payer: Medicare HMO

## 2016-10-06 ENCOUNTER — Telehealth: Payer: Self-pay | Admitting: Pharmacist

## 2016-10-06 ENCOUNTER — Inpatient Hospital Stay (HOSPITAL_COMMUNITY)
Admission: EM | Admit: 2016-10-06 | Discharge: 2016-10-09 | DRG: 291 | Disposition: A | Discharge status: Home Health | Payer: Medicare HMO | Attending: Family Medicine | Admitting: Family Medicine

## 2016-10-06 VITALS — BP 130/80 | HR 84

## 2016-10-06 DIAGNOSIS — Z818 Family history of other mental and behavioral disorders: Secondary | ICD-10-CM | POA: Diagnosis not present

## 2016-10-06 DIAGNOSIS — F419 Anxiety disorder, unspecified: Secondary | ICD-10-CM

## 2016-10-06 DIAGNOSIS — N179 Acute kidney failure, unspecified: Secondary | ICD-10-CM | POA: Diagnosis present

## 2016-10-06 DIAGNOSIS — F200 Paranoid schizophrenia: Secondary | ICD-10-CM | POA: Diagnosis not present

## 2016-10-06 DIAGNOSIS — R0602 Shortness of breath: Secondary | ICD-10-CM | POA: Diagnosis not present

## 2016-10-06 DIAGNOSIS — R0603 Acute respiratory distress: Secondary | ICD-10-CM | POA: Diagnosis not present

## 2016-10-06 DIAGNOSIS — G894 Chronic pain syndrome: Secondary | ICD-10-CM | POA: Diagnosis present

## 2016-10-06 DIAGNOSIS — E785 Hyperlipidemia, unspecified: Secondary | ICD-10-CM | POA: Diagnosis present

## 2016-10-06 DIAGNOSIS — E669 Obesity, unspecified: Secondary | ICD-10-CM | POA: Diagnosis present

## 2016-10-06 DIAGNOSIS — Z7982 Long term (current) use of aspirin: Secondary | ICD-10-CM

## 2016-10-06 DIAGNOSIS — J9621 Acute and chronic respiratory failure with hypoxia: Secondary | ICD-10-CM | POA: Diagnosis present

## 2016-10-06 DIAGNOSIS — I5043 Acute on chronic combined systolic (congestive) and diastolic (congestive) heart failure: Secondary | ICD-10-CM | POA: Diagnosis present

## 2016-10-06 DIAGNOSIS — E1140 Type 2 diabetes mellitus with diabetic neuropathy, unspecified: Secondary | ICD-10-CM | POA: Diagnosis present

## 2016-10-06 DIAGNOSIS — M2550 Pain in unspecified joint: Secondary | ICD-10-CM

## 2016-10-06 DIAGNOSIS — T501X6A Underdosing of loop [high-ceiling] diuretics, initial encounter: Secondary | ICD-10-CM | POA: Diagnosis present

## 2016-10-06 DIAGNOSIS — I509 Heart failure, unspecified: Secondary | ICD-10-CM

## 2016-10-06 DIAGNOSIS — I130 Hypertensive heart and chronic kidney disease with heart failure and stage 1 through stage 4 chronic kidney disease, or unspecified chronic kidney disease: Principal | ICD-10-CM | POA: Diagnosis present

## 2016-10-06 DIAGNOSIS — I252 Old myocardial infarction: Secondary | ICD-10-CM

## 2016-10-06 DIAGNOSIS — I272 Pulmonary hypertension, unspecified: Secondary | ICD-10-CM | POA: Diagnosis present

## 2016-10-06 DIAGNOSIS — Z6841 Body Mass Index (BMI) 40.0 and over, adult: Secondary | ICD-10-CM | POA: Diagnosis not present

## 2016-10-06 DIAGNOSIS — D638 Anemia in other chronic diseases classified elsewhere: Secondary | ICD-10-CM | POA: Diagnosis present

## 2016-10-06 DIAGNOSIS — Z8673 Personal history of transient ischemic attack (TIA), and cerebral infarction without residual deficits: Secondary | ICD-10-CM | POA: Diagnosis not present

## 2016-10-06 DIAGNOSIS — Z9119 Patient's noncompliance with other medical treatment and regimen: Secondary | ICD-10-CM | POA: Diagnosis not present

## 2016-10-06 DIAGNOSIS — Z87891 Personal history of nicotine dependence: Secondary | ICD-10-CM | POA: Diagnosis not present

## 2016-10-06 DIAGNOSIS — R002 Palpitations: Secondary | ICD-10-CM | POA: Diagnosis not present

## 2016-10-06 DIAGNOSIS — N182 Chronic kidney disease, stage 2 (mild): Secondary | ICD-10-CM | POA: Diagnosis present

## 2016-10-06 DIAGNOSIS — Z7951 Long term (current) use of inhaled steroids: Secondary | ICD-10-CM

## 2016-10-06 DIAGNOSIS — Z91128 Patient's intentional underdosing of medication regimen for other reason: Secondary | ICD-10-CM | POA: Diagnosis not present

## 2016-10-06 DIAGNOSIS — K219 Gastro-esophageal reflux disease without esophagitis: Secondary | ICD-10-CM | POA: Diagnosis present

## 2016-10-06 DIAGNOSIS — R450 Nervousness: Secondary | ICD-10-CM | POA: Diagnosis not present

## 2016-10-06 DIAGNOSIS — J45901 Unspecified asthma with (acute) exacerbation: Secondary | ICD-10-CM | POA: Diagnosis present

## 2016-10-06 DIAGNOSIS — R0600 Dyspnea, unspecified: Secondary | ICD-10-CM | POA: Diagnosis present

## 2016-10-06 DIAGNOSIS — F1421 Cocaine dependence, in remission: Secondary | ICD-10-CM | POA: Diagnosis not present

## 2016-10-06 DIAGNOSIS — Z9981 Dependence on supplemental oxygen: Secondary | ICD-10-CM | POA: Diagnosis not present

## 2016-10-06 DIAGNOSIS — R791 Abnormal coagulation profile: Secondary | ICD-10-CM | POA: Diagnosis present

## 2016-10-06 DIAGNOSIS — E1122 Type 2 diabetes mellitus with diabetic chronic kidney disease: Secondary | ICD-10-CM | POA: Diagnosis present

## 2016-10-06 DIAGNOSIS — N189 Chronic kidney disease, unspecified: Secondary | ICD-10-CM | POA: Diagnosis not present

## 2016-10-06 DIAGNOSIS — Z7984 Long term (current) use of oral hypoglycemic drugs: Secondary | ICD-10-CM

## 2016-10-06 DIAGNOSIS — I10 Essential (primary) hypertension: Secondary | ICD-10-CM | POA: Diagnosis not present

## 2016-10-06 DIAGNOSIS — G4733 Obstructive sleep apnea (adult) (pediatric): Secondary | ICD-10-CM | POA: Diagnosis present

## 2016-10-06 LAB — CBC WITH DIFFERENTIAL/PLATELET
Basophils Absolute: 0 10*3/uL (ref 0.0–0.1)
Basophils Relative: 0 %
Eosinophils Absolute: 0.3 10*3/uL (ref 0.0–0.7)
Eosinophils Relative: 3 %
HCT: 28.8 % — ABNORMAL LOW (ref 36.0–46.0)
Hemoglobin: 8.5 g/dL — ABNORMAL LOW (ref 12.0–15.0)
Lymphocytes Relative: 34 %
Lymphs Abs: 3.5 10*3/uL (ref 0.7–4.0)
MCH: 28.1 pg (ref 26.0–34.0)
MCHC: 29.5 g/dL — ABNORMAL LOW (ref 30.0–36.0)
MCV: 95 fL (ref 78.0–100.0)
Monocytes Absolute: 0.6 10*3/uL (ref 0.1–1.0)
Monocytes Relative: 6 %
Neutro Abs: 6 10*3/uL (ref 1.7–7.7)
Neutrophils Relative %: 57 %
Platelets: 300 10*3/uL (ref 150–400)
RBC: 3.03 MIL/uL — ABNORMAL LOW (ref 3.87–5.11)
RDW: 16.3 % — ABNORMAL HIGH (ref 11.5–15.5)
WBC: 10.5 10*3/uL (ref 4.0–10.5)

## 2016-10-06 LAB — I-STAT ARTERIAL BLOOD GAS, ED
Acid-Base Excess: 6 mmol/L — ABNORMAL HIGH (ref 0.0–2.0)
Bicarbonate: 31.8 mmol/L — ABNORMAL HIGH (ref 20.0–28.0)
O2 Saturation: 97 %
Patient temperature: 99.1
TCO2: 33 mmol/L — ABNORMAL HIGH (ref 22–32)
pCO2 arterial: 50 mmHg — ABNORMAL HIGH (ref 32.0–48.0)
pH, Arterial: 7.412 (ref 7.350–7.450)
pO2, Arterial: 95 mmHg (ref 83.0–108.0)

## 2016-10-06 LAB — BASIC METABOLIC PANEL
Anion gap: 8 (ref 5–15)
BUN: 7 mg/dL (ref 6–20)
CO2: 29 mmol/L (ref 22–32)
Calcium: 9.4 mg/dL (ref 8.9–10.3)
Chloride: 103 mmol/L (ref 101–111)
Creatinine, Ser: 1.07 mg/dL — ABNORMAL HIGH (ref 0.44–1.00)
GFR calc Af Amer: >60 mL/min (ref >60)
GFR calc non Af Amer: 57 mL/min — ABNORMAL LOW (ref >60)
Glucose, Bld: 96 mg/dL (ref 65–99)
Potassium: 4.4 mmol/L (ref 3.5–5.1)
Sodium: 140 mmol/L (ref 135–145)

## 2016-10-06 LAB — I-STAT TROPONIN, ED: Troponin i, poc: 0.01 ng/mL (ref 0.00–0.08)

## 2016-10-06 MED ORDER — IPRATROPIUM BROMIDE 0.02 % IN SOLN
1.0000 mg | Freq: Once | RESPIRATORY_TRACT | Status: AC
  Administered 2016-10-07: 1 mg via RESPIRATORY_TRACT
  Filled 2016-10-06: qty 5

## 2016-10-06 MED ORDER — ESCITALOPRAM OXALATE 20 MG PO TABS: 20.0000 mg | ORAL_TABLET | Freq: Every day | ORAL | 1 refills | Status: DC

## 2016-10-06 MED ORDER — METHYLPREDNISOLONE SODIUM SUCC 125 MG IJ SOLR
125.0000 mg | Freq: Once | INTRAMUSCULAR | Status: AC
  Administered 2016-10-07: 125 mg via INTRAVENOUS
  Filled 2016-10-06: qty 2

## 2016-10-06 MED ORDER — FUROSEMIDE 10 MG/ML IJ SOLN
40.0000 mg | Freq: Once | INTRAMUSCULAR | Status: AC
  Administered 2016-10-07: 40 mg via INTRAVENOUS
  Filled 2016-10-06: qty 4

## 2016-10-06 MED ORDER — MIRTAZAPINE 30 MG PO TBDP: 30.0000 mg | ORAL_TABLET | Freq: Every day | ORAL | 1 refills | Status: DC

## 2016-10-06 MED ORDER — ALBUTEROL SULFATE (2.5 MG/3ML) 0.083% IN NEBU
5.0000 mg | INHALATION_SOLUTION | Freq: Once | RESPIRATORY_TRACT | Status: AC
  Administered 2016-10-07: 5 mg via RESPIRATORY_TRACT
  Filled 2016-10-06: qty 6

## 2016-10-06 MED ORDER — ZIPRASIDONE HCL 80 MG PO CAPS: 80.0000 mg | ORAL_CAPSULE | Freq: Every day | ORAL | 1 refills | Status: DC

## 2016-10-06 MED ORDER — DIVALPROEX SODIUM ER 500 MG PO TB24: 1500.0000 mg | ORAL_TABLET | Freq: Every day | ORAL | 1 refills | Status: DC

## 2016-10-06 NOTE — ED Triage Notes (Signed)
Pt arrived via [facility] c/o SOB for the last couple hours.  Wears 6L O2 [place] at home.  EMS gave 5mg  Albuterol.  Pt was intubated x5 days about 2 weeks ago.

## 2016-10-06 NOTE — Patient Outreach (Signed)
[name]) Care Management  10/06/2016  Tiffany [name] 26-Jan-1960 [id number]   Patient was called to follow up.  HIPAA identifiers were obtained.  Patient is a 56 year old female with multiple medical conditions including but not limited to:  Hypertension, CHF, CKD, type 2 diabetes, schizophrenia, diabetic neuropathy, COPD, pulmonary hypertension, morbid obesity and history of cocaine abuse.  She was hospitalized for respiratory failure and required intubation.  Patient reported feeling much better.  She was unable to review her medications over the phone. A pharmacy home visit was scheduled for Wednesday October 10, 2016.   Elayne Guerin, PharmD, [place] Clinical Pharmacist [phone]

## 2016-10-06 NOTE — ED Provider Notes (Signed)
TIME SEEN: 10:57 PM  CHIEF COMPLAINT: shortness of breath  HPI: Pt is a 56 y.o. obese female with history of COPD, CHF, cocaine abuse who presents to the emergency department with shortness of breath. [name] also having left dual, achy chest pain. No tightness or pressure. Reports she feels like her feet are swollen. Shortness of breath worse with lying flat and worse with exertion. She does wear 6 L of oxygen at home. Was recently admitted to the hospital and intubated for respiratory failure secondary to COPD and CHF.  Patient received 5 mg of albuterol with EMS.  Right leg in walking boot.  Patient reports fracture in this leg 2-1/2 months ago. No history of PE or DVT.  ROS: See HPI Constitutional: no fever  Eyes: no drainage  ENT: no runny nose   Cardiovascular:   chest pain  Resp:  SOB  GI: no vomiting GU: no dysuria Integumentary: no rash  Allergy: no hives  Musculoskeletal: no leg swelling  Neurological: no slurred speech ROS otherwise negative  PAST MEDICAL HISTORY/PAST SURGICAL HISTORY:  Past Medical History:  Diagnosis Date  . Anxiety   . Arthritis    "all over" (04/10/2016)  . Asthma   . Cardiac arrest ([place]) 09/08/2016   PEA  . CHF (congestive heart failure) ([place])   . Chronic bronchitis ([place])   . Chronic kidney disease    "I see a kidney dr." (04/10/2016)  . Cocaine abuse   . Complication of anesthesia    decreased bp, decreased heart rate  . Depression   . Disorder of nervous system   . Emphysema   . GERD (gastroesophageal reflux disease)   . Heart attack ([place]) 1980s  . History of blood transfusion 1994   "couldn't stop bleeding from my period"  . Hyperlipidemia LDL goal <70   . Hypertension   . Incontinence   . Manic depression ([employer])   . On home oxygen therapy    "6L; 24/7" (04/10/2016)  . OSA on CPAP    "wear mask sometimes" (04/10/2016)  . Paranoid ([place])    "sometimes; I'm on RX for it" (04/10/2016)  . Pneumonia    "I've had it several times; haven't  had it since 06/2015" (04/10/2016)  . Schizophrenia ([place])   . Seasonal allergies   . Seizures ([place])    "don't know what kind; last one was ~ 1 yr ago" (04/10/2016)  . Sinus trouble   . Stroke [facility]) 1980s   denies residual on 04/10/2016  . Type II diabetes mellitus (HCC)     MEDICATIONS:  Prior to Admission medications   Medication Sig Start Date End Date Taking? Authorizing Provider  albuterol (PROVENTIL) (2.5 MG/3ML) 0.083% nebulizer solution Take 3 mLs (2.5 mg total) by nebulization every 6 (six) hours as needed for wheezing or shortness of breath. 06/13/16   Archie Patten, MD  Alcohol Swabs (B-D SINGLE USE SWABS REGULAR) PADS CHECK CAPILLARY BLOOD GLUCOSE ONE TIME DAILY 02/03/16   Archie Patten, MD  amLODipine (NORVASC) 5 MG tablet Take 1 tablet (5 mg total) by mouth daily. 06/13/16   Archie Patten, MD  aspirin 81 MG EC tablet Take 1 tablet (81 mg total) by mouth daily. Swallow whole. 06/13/16   Archie Patten, MD  atorvastatin (LIPITOR) 40 MG tablet Take 1 tablet (40 mg total) by mouth daily at bedtime 09/21/16   Sela Hilding, MD  Blood Glucose Monitoring Suppl (ACCU-CHEK AVIVA PLUS) w/Device KIT CHECK BLOOD SUGAR THREE TIMES DAILY Patient  not taking: Reported on 09/25/2016 12/02/15   Archie Patten, MD  divalproex ([name] ER) 500 MG 24 hr tablet Take 3 tablets (1,500 mg total) by mouth at bedtime. 10/06/16 10/06/17  Arfeen, Arlyce Harman, MD  escitalopram (LEXAPRO) 20 MG tablet Take 1 tablet (20 mg total) by mouth daily. 10/06/16   Arfeen, Arlyce Harman, MD  fluticasone furoate-vilanterol (BREO ELLIPTA) 100-25 MCG/INH AEPB INHALE 1 PUFF EVERY DAY 09/14/16   Mayo, Pete Pelt, MD  gabapentin (NEURONTIN) 600 MG tablet Take 1 tablet (600 mg total) by mouth 3 (three) times daily. 06/28/16   Archie Patten, MD  glucose blood test strip Use to check blood sugar 3 times daily. Patient not taking: Reported on 09/25/2016 12/05/15   Pucilowska, Herma Ard B, MD  metFORMIN (GLUCOPHAGE-XR) 500 MG 24 hr  tablet Take 1 tablet (500 mg total) by mouth 2 (two) times daily. 06/28/16   Archie Patten, MD  mirtazapine (REMERON SOL-TAB) 30 MG disintegrating tablet Take 1 tablet (30 mg total) by mouth at bedtime. 10/06/16 10/06/17  Arfeen, Arlyce Harman, MD  montelukast (SINGULAIR) 10 MG tablet Take 1 tablet (10 mg total) by mouth at bedtime. 06/13/16   Archie Patten, MD  Multiple Vitamin (MULTIVITAMIN WITH MINERALS) TABS tablet Take 1 tablet by mouth daily.    [provider]  omeprazole (PRILOSEC) 40 MG capsule Take 1 capsule (40 mg total) by mouth 2 (two) times daily. 08/23/16   Mikell, Jeani Sow, MD  OXYGEN Inhale 6 L into the lungs daily.    [provider]  potassium chloride SA (K-DUR,KLOR-CON) 20 MEQ tablet Take 1 tablet (20 mEq total) by mouth 2 (two) times daily. 09/21/16   Sela Hilding, MD  tiotropium (SPIRIVA HANDIHALER) 18 MCG inhalation capsule Place 1 capsule (18 mcg total) into inhaler and inhale daily. 06/13/16   Archie Patten, MD  torsemide (DEMADEX) 20 MG tablet Take 3 tablets (60 mg total) by mouth daily. 09/21/16 12/20/16  Sela Hilding, MD  VENTOLIN HFA 108 [phone] Base) MCG/ACT inhaler Inhale 2 puffs into the lungs every 6 (six) hours as needed for wheezing or shortness of breath.  06/01/16   [provider]  ziprasidone (GEODON) 80 MG capsule Take 1 capsule (80 mg total) by mouth at bedtime. 10/06/16   Arfeen, Arlyce Harman, MD    ALLERGIES:  Allergies  Allergen Reactions  . Hydroxyzine Shortness Of Breath    -throat closed up  . Latuda [Lurasidone Hcl] Anaphylaxis  . Codeine Nausea And Vomiting  . Sulfa Antibiotics Itching    SOCIAL HISTORY:  Social History  Substance Use Topics  . Smoking status: Former Smoker    Packs/day: 1.50    Years: 38.00    Types: Cigarettes    Start date: 03/13/1977    Quit date: 04/10/2016  . Smokeless tobacco: Never Used  . Alcohol use No    FAMILY HISTORY: Family History  Problem Relation Age of Onset  . Cancer  Father        prostate  . Cancer Mother        lung  . Depression Mother   . Depression Sister   . Anxiety disorder Sister   . Schizophrenia Sister   . Bipolar disorder Sister   . Depression Sister   . Depression Brother   . Heart failure Unknown        cousin    EXAM: BP (!) 151/120 (BP Location: Right Arm)   Pulse 90   Temp 98.8 F (37.1 C) (  Oral)   Resp 20   Ht _0  (1.626 m)   Wt 129.3 kg (285 lb)   SpO2 99%   BMI 48.92 kg/m  CONSTITUTIONAL: Alert and oriented and responds appropriately to questions. Obese, chronically ill-appearing HEAD: Normocephalic EYES: Conjunctivae clear, pupils appear equal, EOMI ENT: normal nose; moist mucous membranes NECK: Supple, no meningismus, no nuchal rigidity, no LAD  CARD: RRR; S1 and S2 appreciated; no murmurs, no clicks, no rubs, no gallops RESP: appear short of breath, on her normal 6 L oxygen, breath sounds are equal and clear bilaterally, no respiratory distress but is speaking short sentences, I do not appreciate rhonchi, rales or wheezing ABD/GI: Normal bowel sounds; non-distended; soft, non-tender, no rebound, no guarding, no peritoneal signs, no hepatosplenomegaly BACK:  The back appears normal and is non-tender to palpation, there is no CVA tenderness EXT: right leg in walking boot.  Normal ROM in all joints; non-tender to palpation; no edema; normal capillary refill; no cyanosis, no calf tenderness or swelling    SKIN: Normal color for age and race; warm; no rash NEURO: Moves all extremities equally PSYCH: The patient's mood and manner are appropriate. Grooming and personal hygiene are appropriate.  MEDICAL DECISION MAKING: Pt here with shortness of breath. Suspect combination of CHF and COPD.  She does have her sectors for PE given her right leg is in a walking boot from recent fracture and she had a recent intubation and hospitalization. Will obtain cardiac labs, chest x-ray, d-dimer. EKG shows no new ischemic change.  ED  PROGRESS: Patient's troponin negative. Chest x-ray suggestive of volume overload. Will give IV Lasix and admit. ECP is with family medicine.  1:11 AM Discussed patient's case with FM resident, Dr. Lindell Noe.  I have recommended admission and patient (and family if present) agree with this plan. Admitting physician will place admission orders.   They will follow-up on d-dimer.  I reviewed all nursing notes, vitals, pertinent previous records, EKGs, lab and urine results, imaging (as available).  Patient's d-dimer positive. CT scan obtained with no sign of significant central pulmonary. Cardiac enlargement with pulmonary edema noted.    EKG Interpretation  Date/Time:  Friday October 06 2016 22:52:36 EDT Ventricular Rate:  89 PR Interval:    QRS Duration: 73 QT Interval:  369 QTC Calculation: 449 R Axis:   43 Text Interpretation:  Sinus rhythm Low voltage, precordial leads Confirmed by Ward, Cyril Mourning [phone]) on 10/06/2016 10:57:57 PM           Ward, Delice Bison, DO 10/07/16 [id number]

## 2016-10-06 NOTE — Progress Notes (Signed)
BH MD/PA/NP OP Progress Note  10/06/2016 10:45 AM Tiffany [name]  MRN:  [id number]  Chief Complaint:  I'm doing so-so.  I have been in and out from the hospital.  I'm concerned about my physical health.  HPI: Patient came for her follow-up appointment with her son.  Patient has been in and out from the hospital due to her physical condition.  She has exacerbation of CHF and also she had respiratory failure that required intubation.  [name] feeling tired and exhausted.  She admitted not able to sleep as good.  [name] using wheelchair and oxygen.  [name] living with her son who is supportive and taking her to the doctor's appointment.  Patient told that she was very scared when she has respiratory failure and she was intubated.  [name] pleased things are going very well but she still gets easily tired.  [name] no longer taking Cogentin because she has no tremors shakes.  [name] complaining of right leg pain because of fall and she fractured her leg.  [name] wearing cast and she will see orthopedic next month.  She was also supposed to get CPAP machine but she has not started yet.  [name] taking [name] thousand milligram at bedtime.  Despite she was recommended to take the Remeron 45 she only taking 30 mg because her pharmacy did not provide 45 mg.  [name] very concerned and nervous about her physical health.  She has shortness of breath, joint pain, tingling, and now she is worried about her kidney.  Her last BUN was 49 and creatinine 1.70.  Her potassium was low and her GFR is 32.  Patient denies any mania, psychosis, hallucination but gets easily emotional and feeling tired with lack of energy and motivation to do things.  [name] to take a higher dose of Geodon but due to increased QTC it was reduced to 80 mg at bedtime.  She feels proud that she is not using drugs or cocaine.  Visit Diagnosis:    ICD-10-CM   1. Paranoid schizophrenia ([place]) F20.0 escitalopram (LEXAPRO) 20 MG tablet    divalproex  ([name] ER) 500 MG 24 hr tablet    ziprasidone (GEODON) 80 MG capsule    mirtazapine (REMERON SOL-TAB) 30 MG disintegrating tablet    Valproic acid level    Past Psychiatric History: Reviewed. Patient has multiple psychiatric hospitalization due to relapse into drug use and mental illness. Her last psychiatric hospitalization was in November 2017 at [facility]. She remember having psychiatric illness started in 1984-04-10 when her daughter died due to cancer. She had tried Abilify, Risperdal , Wellbutrin , Xanax , Klonopin, trazodone, BuSpar in the past. She was seeing psychiatrist at [facility] . Patient denies any history of suicidal attempt but reported history of suicidal thoughts. She had history of paranoia, delusion, anxiety and manic-like symptoms. In the past she was high dose of geodon but reduced due to increase QTC. Past Medical History:  Past Medical History:  Diagnosis Date  . Anxiety   . Arthritis    "all over" (04/10/2016)  . Asthma   . Cardiac arrest ([place]) 09/08/2016   PEA  . CHF (congestive heart failure) ([place])   . Chronic bronchitis ([place])   . Chronic kidney disease    "I see a kidney dr." (04/10/2016)  . Cocaine abuse   . Complication of anesthesia    decreased bp, decreased heart rate  . Depression   . Disorder of  nervous system   . Emphysema   . GERD (gastroesophageal reflux disease)   . Heart attack ([place]) 1980s  . History of blood transfusion 1994   "couldn't stop bleeding from my period"  . Hyperlipidemia LDL goal <70   . Hypertension   . Incontinence   . Manic depression ([place])   . On home oxygen therapy    "6L; 24/7" (04/10/2016)  . OSA on CPAP    "wear mask sometimes" (04/10/2016)  . Paranoid ([place])    "sometimes; I'm on RX for it" (04/10/2016)  . Pneumonia    "I've had it several times; haven't had it since 06/2015" (04/10/2016)  . Schizophrenia ([place])   . Seasonal allergies   . Seizures ([place])    "don't know what kind; last one was ~ 1 yr  ago" (04/10/2016)  . Sinus trouble   . Stroke [facility]) 1980s   denies residual on 04/10/2016  . Type II diabetes mellitus ([place])     Past Surgical History:  Procedure Laterality Date  . CESAREAN SECTION  1997  . HERNIA REPAIR    . IR CHOLANGIOGRAM EXISTING TUBE  07/20/2016  . IR PERC CHOLECYSTOSTOMY  05/10/2016  . IR RADIOLOGIST EVAL & MGMT  06/08/2016  . IR SINUS/FIST TUBE CHK-NON GI  07/12/2016  . TIBIA IM NAIL INSERTION Right 07/12/2016   Procedure: INTRAMEDULLARY (IM) NAIL RIGHT TIBIA;  Surgeon: Leandrew Koyanagi, MD;  Location: [facility];  Service: Orthopedics;  Laterality: Right;  . UMBILICAL HERNIA REPAIR  ~ 1963   "that's why I don't have a belly button"  . VAGINAL HYSTERECTOMY      Family Psychiatric History: Reviewed.  Family History:  Family History  Problem Relation Age of Onset  . Cancer Father        prostate  . Cancer Mother        lung  . Depression Mother   . Depression Sister   . Anxiety disorder Sister   . Schizophrenia Sister   . Bipolar disorder Sister   . Depression Sister   . Depression Brother   . Heart failure Unknown        cousin    Social History:  Social History   Social History  . Marital status: Widowed    Spouse name: N/A  . Number of children: 3  . Years of education: N/A   Occupational History  . disabled     factory production   Social History Main Topics  . Smoking status: Former Smoker    Packs/day: 1.50    Years: 38.00    Types: Cigarettes    Start date: 03/13/1977    Quit date: 04/10/2016  . Smokeless tobacco: Never Used  . Alcohol use No  . Drug use: No     Comment: 04/10/2016 "last used cocaine back in November 2017"  . Sexual activity: No   Other Topics Concern  . None   Social History Narrative   Has 1 son, Tiffany [name]   Lives with son and his boyfriend   Her house has ramps and handrails should she ever needs them.    Her mother lives down the street from her and is a good support person in addition to her son.   She drives  herself, has private transportation.    Cocaine free since 02/24/16, smoke free since 04/10/16       Allergies:  Allergies  Allergen Reactions  . Hydroxyzine Shortness Of Breath    -throat closed up  . Anette Guarneri [Lurasidone  Hcl] Anaphylaxis  . Codeine Nausea And Vomiting  . Sulfa Antibiotics Itching    Metabolic Disorder Labs: Lab Results  Component Value Date   HGBA1C 5.6 07/05/2016   MPG 120 04/10/2016   MPG 114 11/25/2015   No results found for: PROLACTIN Lab Results  Component Value Date   CHOL 113 07/10/2016   TRIG 91 07/10/2016   HDL 51 07/10/2016   CHOLHDL 2.2 07/10/2016   VLDL 18 07/10/2016   LDLCALC 44 07/10/2016   LDLCALC 62 12/04/2015   Lab Results  Component Value Date   TSH 0.563 03/15/2016   TSH 1.060 12/04/2015    Therapeutic Level Labs: No results found for: LITHIUM Lab Results  Component Value Date   VALPROATE 20 (L) 09/09/2016   VALPROATE 46 (L) 07/10/2016   No components found for:  CBMZ  Current Medications: Current Outpatient Prescriptions  Medication Sig Dispense Refill  . albuterol (PROVENTIL) (2.5 MG/3ML) 0.083% nebulizer solution Take 3 mLs (2.5 mg total) by nebulization every 6 (six) hours as needed for wheezing or shortness of breath. 150 mL 1  . Alcohol Swabs (B-D SINGLE USE SWABS REGULAR) PADS CHECK CAPILLARY BLOOD GLUCOSE ONE TIME DAILY 100 each 3  . amLODipine (NORVASC) 5 MG tablet Take 1 tablet (5 mg total) by mouth daily. 90 tablet 0  . aspirin 81 MG EC tablet Take 1 tablet (81 mg total) by mouth daily. Swallow whole. 90 tablet 0  . atorvastatin (LIPITOR) 40 MG tablet Take 1 tablet (40 mg total) by mouth daily at bedtime 60 tablet 3  . divalproex ([name] ER) 500 MG 24 hr tablet Take 3 tablets (1,500 mg total) by mouth at bedtime. 90 tablet 1  . escitalopram (LEXAPRO) 20 MG tablet Take 1 tablet (20 mg total) by mouth daily. 30 tablet 1  . fluticasone furoate-vilanterol (BREO ELLIPTA) 100-25 MCG/INH AEPB INHALE 1 PUFF EVERY DAY 180  each 0  . gabapentin (NEURONTIN) 600 MG tablet Take 1 tablet (600 mg total) by mouth 3 (three) times daily. 270 tablet 0  . metFORMIN (GLUCOPHAGE-XR) 500 MG 24 hr tablet Take 1 tablet (500 mg total) by mouth 2 (two) times daily. 180 tablet 0  . montelukast (SINGULAIR) 10 MG tablet Take 1 tablet (10 mg total) by mouth at bedtime. 90 tablet 1  . Multiple Vitamin (MULTIVITAMIN WITH MINERALS) TABS tablet Take 1 tablet by mouth daily.    [name] omeprazole (PRILOSEC) 40 MG capsule Take 1 capsule (40 mg total) by mouth 2 (two) times daily. 180 capsule 0  . OXYGEN Inhale 6 L into the lungs daily.    . potassium chloride SA (K-DUR,KLOR-CON) 20 MEQ tablet Take 1 tablet (20 mEq total) by mouth 2 (two) times daily. 150 tablet 2  . tiotropium (SPIRIVA HANDIHALER) 18 MCG inhalation capsule Place 1 capsule (18 mcg total) into inhaler and inhale daily. 90 capsule 1  . torsemide (DEMADEX) 20 MG tablet Take 3 tablets (60 mg total) by mouth daily. 120 tablet 0  . VENTOLIN HFA 108 (90 Base) MCG/ACT inhaler Inhale 2 puffs into the lungs every 6 (six) hours as needed for wheezing or shortness of breath.     . ziprasidone (GEODON) 80 MG capsule Take 1 capsule (80 mg total) by mouth at bedtime. 30 capsule 1  . Blood Glucose Monitoring Suppl (ACCU-CHEK AVIVA PLUS) w/Device KIT CHECK BLOOD SUGAR THREE TIMES DAILY (Patient not taking: Reported on 09/25/2016) 1 kit 0  . glucose blood test strip Use to check blood sugar 3 times daily. (  Patient not taking: Reported on 09/25/2016) 100 each 12  . mirtazapine (REMERON SOL-TAB) 30 MG disintegrating tablet Take 1 tablet (30 mg total) by mouth at bedtime. 30 tablet 1   No current facility-administered medications for this visit.      Musculoskeletal: Strength & Muscle Tone: within normal limits Gait & Station: unsteady Patient leans: Left, Front, Backward and on wheelchair  Psychiatric Specialty Exam: Review of Systems  Constitutional: Negative.   HENT: Negative.   Respiratory:  Positive for shortness of breath.   Cardiovascular: Positive for palpitations.  Musculoskeletal: Positive for joint pain.       Patient using wheelchair.  She has fracture right leg and wearing cast.  Skin: Negative for itching and rash.  Neurological: Positive for tingling.  Psychiatric/Behavioral: Positive for depression. The patient is nervous/anxious.     Blood pressure 130/80, pulse 84.There is no height or weight on file to calculate BMI.  General Appearance: Casual using oxygen   Eye Contact:  Good  Speech:  Slow  Volume:  Decreased  Mood:  Anxious and Dysphoric  Affect:  Constricted  Thought Process:  Goal Directed  Orientation:  Full (Time, Place, and Person)  Thought Content: Logical and Rumination   Suicidal Thoughts:  No  Homicidal Thoughts:  No  Memory:  Immediate;   Good Recent;   Good Remote;   Fair  Judgement:  Good  Insight:  Good  Psychomotor Activity:  Decreased  Concentration:  Concentration: Fair and Attention Span: Fair  Recall:  [place] of Knowledge: Good  Language: Good  Akathisia:  No  Handed:  Right  AIMS (if indicated): not done  Assets:  Communication Skills Desire for Improvement Housing Social Support  ADL's:  Intact  Cognition: WNL  Sleep:  Fair   Screenings: AUDIT     Admission (Discharged) from 12/01/2015 in [facility]  Alcohol Use Disorder Identification Test Final Score (AUDIT)  0    PHQ2-9     Office Visit from 09/26/2016 in [facility] Office Visit from 09/07/2016 in [place] [facility] Office Visit from 08/21/2016 in [facility] Patient Outreach from 07/27/2016 in [facility] Visit from 07/05/2016 in [facility]  PHQ-2 Total Score  0  0  0  0  0       Assessment and Plan: Bipolar disorder type I.  Cocaine dependence in complete remission.  I review records, blood work results, collateral information from  other providers.  Her [name] level is 20.  She's not taking Remeron 45 which was recommended on her last visit.  Recommended to try [name] 1500 mg since she has a subtherapeutic level and she still have depression and emotional lability.  She will continue Geodon 80 mg at bedtime.  I will discontinue Cogentin since she has no tremors.  She will continue Lexapro 20 mg daily.  She will also continue Remeron 30 mg at bedtime and we will get repeat [name] level in 2-3 weeks.  I also suggested that she should see a therapist as she is not able to see counselor since [facility] left.  We will schedule appointment with Janett Billow.  She will get appointment.  Discussed in detail medication side effects and benefits.  Recommended to call us back if she has any question, concern or if she feels worsening of the symptoms.  Follow-up in 2 months.  Time spent 25 minutes.   Everson Mott T., MD 10/06/2016, 10:45  AM

## 2016-10-07 ENCOUNTER — Encounter (HOSPITAL_COMMUNITY): Payer: Self-pay | Admitting: *Deleted

## 2016-10-07 ENCOUNTER — Inpatient Hospital Stay (HOSPITAL_COMMUNITY): Payer: Medicare HMO

## 2016-10-07 DIAGNOSIS — J9621 Acute and chronic respiratory failure with hypoxia: Secondary | ICD-10-CM | POA: Diagnosis present

## 2016-10-07 DIAGNOSIS — J45901 Unspecified asthma with (acute) exacerbation: Secondary | ICD-10-CM | POA: Diagnosis present

## 2016-10-07 DIAGNOSIS — E1140 Type 2 diabetes mellitus with diabetic neuropathy, unspecified: Secondary | ICD-10-CM | POA: Diagnosis present

## 2016-10-07 DIAGNOSIS — N179 Acute kidney failure, unspecified: Secondary | ICD-10-CM | POA: Diagnosis present

## 2016-10-07 DIAGNOSIS — Z9119 Patient's noncompliance with other medical treatment and regimen: Secondary | ICD-10-CM | POA: Diagnosis not present

## 2016-10-07 DIAGNOSIS — R0602 Shortness of breath: Secondary | ICD-10-CM | POA: Diagnosis present

## 2016-10-07 DIAGNOSIS — Z6841 Body Mass Index (BMI) 40.0 and over, adult: Secondary | ICD-10-CM | POA: Diagnosis not present

## 2016-10-07 DIAGNOSIS — D638 Anemia in other chronic diseases classified elsewhere: Secondary | ICD-10-CM | POA: Diagnosis present

## 2016-10-07 DIAGNOSIS — Z9981 Dependence on supplemental oxygen: Secondary | ICD-10-CM | POA: Diagnosis not present

## 2016-10-07 DIAGNOSIS — E785 Hyperlipidemia, unspecified: Secondary | ICD-10-CM | POA: Diagnosis present

## 2016-10-07 DIAGNOSIS — K219 Gastro-esophageal reflux disease without esophagitis: Secondary | ICD-10-CM | POA: Diagnosis present

## 2016-10-07 DIAGNOSIS — T501X6A Underdosing of loop [high-ceiling] diuretics, initial encounter: Secondary | ICD-10-CM | POA: Diagnosis present

## 2016-10-07 DIAGNOSIS — Z8673 Personal history of transient ischemic attack (TIA), and cerebral infarction without residual deficits: Secondary | ICD-10-CM | POA: Diagnosis not present

## 2016-10-07 DIAGNOSIS — I509 Heart failure, unspecified: Secondary | ICD-10-CM | POA: Diagnosis not present

## 2016-10-07 DIAGNOSIS — Z7984 Long term (current) use of oral hypoglycemic drugs: Secondary | ICD-10-CM | POA: Diagnosis not present

## 2016-10-07 DIAGNOSIS — E669 Obesity, unspecified: Secondary | ICD-10-CM | POA: Diagnosis present

## 2016-10-07 DIAGNOSIS — F200 Paranoid schizophrenia: Secondary | ICD-10-CM | POA: Diagnosis present

## 2016-10-07 DIAGNOSIS — I10 Essential (primary) hypertension: Secondary | ICD-10-CM | POA: Diagnosis not present

## 2016-10-07 DIAGNOSIS — Z91128 Patient's intentional underdosing of medication regimen for other reason: Secondary | ICD-10-CM | POA: Diagnosis not present

## 2016-10-07 DIAGNOSIS — I272 Pulmonary hypertension, unspecified: Secondary | ICD-10-CM | POA: Diagnosis present

## 2016-10-07 DIAGNOSIS — I5043 Acute on chronic combined systolic (congestive) and diastolic (congestive) heart failure: Secondary | ICD-10-CM | POA: Diagnosis present

## 2016-10-07 DIAGNOSIS — R0603 Acute respiratory distress: Secondary | ICD-10-CM | POA: Diagnosis not present

## 2016-10-07 DIAGNOSIS — E1122 Type 2 diabetes mellitus with diabetic chronic kidney disease: Secondary | ICD-10-CM | POA: Diagnosis present

## 2016-10-07 DIAGNOSIS — Z87891 Personal history of nicotine dependence: Secondary | ICD-10-CM | POA: Diagnosis not present

## 2016-10-07 DIAGNOSIS — G894 Chronic pain syndrome: Secondary | ICD-10-CM | POA: Diagnosis present

## 2016-10-07 DIAGNOSIS — G4733 Obstructive sleep apnea (adult) (pediatric): Secondary | ICD-10-CM | POA: Diagnosis present

## 2016-10-07 DIAGNOSIS — R0600 Dyspnea, unspecified: Secondary | ICD-10-CM | POA: Diagnosis present

## 2016-10-07 DIAGNOSIS — N182 Chronic kidney disease, stage 2 (mild): Secondary | ICD-10-CM | POA: Diagnosis present

## 2016-10-07 DIAGNOSIS — Z818 Family history of other mental and behavioral disorders: Secondary | ICD-10-CM | POA: Diagnosis not present

## 2016-10-07 DIAGNOSIS — I130 Hypertensive heart and chronic kidney disease with heart failure and stage 1 through stage 4 chronic kidney disease, or unspecified chronic kidney disease: Secondary | ICD-10-CM | POA: Diagnosis present

## 2016-10-07 LAB — D-DIMER, QUANTITATIVE: D-Dimer, Quant: 0.9 ug/mL-FEU — ABNORMAL HIGH (ref 0.00–0.50)

## 2016-10-07 LAB — BASIC METABOLIC PANEL
Anion gap: 13 (ref 5–15)
BUN: 8 mg/dL (ref 6–20)
CO2: 30 mmol/L (ref 22–32)
Calcium: 9.6 mg/dL (ref 8.9–10.3)
Chloride: 98 mmol/L — ABNORMAL LOW (ref 101–111)
Creatinine, Ser: 1.23 mg/dL — ABNORMAL HIGH (ref 0.44–1.00)
GFR calc Af Amer: 55 mL/min — ABNORMAL LOW (ref >60)
GFR calc non Af Amer: 48 mL/min — ABNORMAL LOW (ref >60)
Glucose, Bld: 151 mg/dL — ABNORMAL HIGH (ref 65–99)
Potassium: 4 mmol/L (ref 3.5–5.1)
Sodium: 141 mmol/L (ref 135–145)

## 2016-10-07 LAB — MAGNESIUM: Magnesium: 1.8 mg/dL (ref 1.7–2.4)

## 2016-10-07 LAB — RAPID URINE DRUG SCREEN, HOSP PERFORMED
Amphetamines: NOT DETECTED
Barbiturates: NOT DETECTED
Benzodiazepines: NOT DETECTED
Cocaine: NOT DETECTED
Opiates: NOT DETECTED
Tetrahydrocannabinol: NOT DETECTED

## 2016-10-07 LAB — GLUCOSE, CAPILLARY
Glucose-Capillary: 116 mg/dL — ABNORMAL HIGH (ref 65–99)
Glucose-Capillary: 109 mg/dL — ABNORMAL HIGH (ref 65–99)

## 2016-10-07 LAB — TROPONIN I
Troponin I: <0.03 ng/mL (ref <0.03)
Troponin I: <0.03 ng/mL (ref <0.03)
Troponin I: <0.03 ng/mL (ref <0.03)

## 2016-10-07 LAB — BRAIN NATRIURETIC PEPTIDE: B Natriuretic Peptide: 77.7 pg/mL (ref 0.0–100.0)

## 2016-10-07 LAB — MRSA PCR SCREENING: MRSA by PCR: NEGATIVE

## 2016-10-07 LAB — CBG MONITORING, ED: Glucose-Capillary: 161 mg/dL — ABNORMAL HIGH (ref 65–99)

## 2016-10-07 MED ORDER — SODIUM CHLORIDE 0.9 % IV SOLN: 250.0000 mL | INTRAVENOUS | Status: DC | PRN

## 2016-10-07 MED ORDER — ALBUTEROL SULFATE (2.5 MG/3ML) 0.083% IN NEBU: 2.5000 mg | INHALATION_SOLUTION | RESPIRATORY_TRACT | Status: DC | PRN

## 2016-10-07 MED ORDER — FUROSEMIDE 10 MG/ML IJ SOLN
20.0000 mg | Freq: Once | INTRAMUSCULAR | Status: DC
  Filled 2016-10-07: qty 2

## 2016-10-07 MED ORDER — ATORVASTATIN CALCIUM 40 MG PO TABS
40.0000 mg | ORAL_TABLET | Freq: Every day | ORAL | Status: DC
  Administered 2016-10-07 – 2016-10-08 (×2): 40 mg via ORAL
  Filled 2016-10-07 (×3): qty 1

## 2016-10-07 MED ORDER — HEPARIN SODIUM (PORCINE) 5000 UNIT/ML IJ SOLN
5000.0000 [IU] | Freq: Three times a day (TID) | INTRAMUSCULAR | Status: DC
  Administered 2016-10-07 – 2016-10-09 (×7): 5000 [IU] via SUBCUTANEOUS
  Filled 2016-10-07 (×7): qty 1

## 2016-10-07 MED ORDER — POLYETHYLENE GLYCOL 3350 17 G PO PACK: 17.0000 g | PACK | Freq: Every day | ORAL | Status: DC | PRN

## 2016-10-07 MED ORDER — ACETAMINOPHEN 325 MG PO TABS
650.0000 mg | ORAL_TABLET | Freq: Four times a day (QID) | ORAL | Status: DC | PRN
  Administered 2016-10-07: 650 mg via ORAL
  Filled 2016-10-07: qty 2

## 2016-10-07 MED ORDER — AMLODIPINE BESYLATE 5 MG PO TABS
5.0000 mg | ORAL_TABLET | Freq: Every day | ORAL | Status: DC
  Administered 2016-10-07 – 2016-10-09 (×3): 5 mg via ORAL
  Filled 2016-10-07 (×3): qty 1

## 2016-10-07 MED ORDER — GABAPENTIN 300 MG PO CAPS
600.0000 mg | ORAL_CAPSULE | Freq: Three times a day (TID) | ORAL | Status: DC
Start: 2016-10-07 — End: 2016-10-09
  Administered 2016-10-07 – 2016-10-09 (×8): 600 mg via ORAL
  Filled 2016-10-07 (×8): qty 2

## 2016-10-07 MED ORDER — INSULIN ASPART 100 UNIT/ML ~~LOC~~ SOLN
0.0000 [IU] | Freq: Three times a day (TID) | SUBCUTANEOUS | Status: DC
  Administered 2016-10-07: 3 [IU] via SUBCUTANEOUS
  Filled 2016-10-07: qty 1

## 2016-10-07 MED ORDER — SODIUM CHLORIDE 0.9% FLUSH
3.0000 mL | Freq: Two times a day (BID) | INTRAVENOUS | Status: DC
  Administered 2016-10-07 – 2016-10-09 (×4): 3 mL via INTRAVENOUS

## 2016-10-07 MED ORDER — PANTOPRAZOLE SODIUM 40 MG PO TBEC
40.0000 mg | DELAYED_RELEASE_TABLET | Freq: Every day | ORAL | Status: DC
  Administered 2016-10-07 – 2016-10-09 (×3): 40 mg via ORAL
  Filled 2016-10-07 (×3): qty 1

## 2016-10-07 MED ORDER — ACETAMINOPHEN 650 MG RE SUPP: 650.0000 mg | Freq: Four times a day (QID) | RECTAL | Status: DC | PRN

## 2016-10-07 MED ORDER — MONTELUKAST SODIUM 10 MG PO TABS
10.0000 mg | ORAL_TABLET | Freq: Every day | ORAL | Status: DC
Start: 2016-10-07 — End: 2016-10-09
  Administered 2016-10-07 – 2016-10-08 (×2): 10 mg via ORAL
  Filled 2016-10-07 (×2): qty 1

## 2016-10-07 MED ORDER — FLUTICASONE FUROATE-VILANTEROL 100-25 MCG/INH IN AEPB
1.0000 | INHALATION_SPRAY | Freq: Every day | RESPIRATORY_TRACT | Status: DC
  Administered 2016-10-07 – 2016-10-09 (×3): 1 via RESPIRATORY_TRACT
  Filled 2016-10-07 (×3): qty 28

## 2016-10-07 MED ORDER — IPRATROPIUM-ALBUTEROL 0.5-2.5 (3) MG/3ML IN SOLN
3.0000 mL | Freq: Four times a day (QID) | RESPIRATORY_TRACT | Status: AC
  Administered 2016-10-07 (×3): 3 mL via RESPIRATORY_TRACT
  Filled 2016-10-07 (×3): qty 3

## 2016-10-07 MED ORDER — ASPIRIN EC 81 MG PO TBEC
81.0000 mg | DELAYED_RELEASE_TABLET | Freq: Every day | ORAL | Status: DC
  Administered 2016-10-07 – 2016-10-09 (×3): 81 mg via ORAL
  Filled 2016-10-07 (×3): qty 1

## 2016-10-07 MED ORDER — IOPAMIDOL (ISOVUE-370) INJECTION 76%
INTRAVENOUS | Status: AC
  Administered 2016-10-07: 100 mL
  Filled 2016-10-07: qty 100

## 2016-10-07 MED ORDER — ESCITALOPRAM OXALATE 20 MG PO TABS
20.0000 mg | ORAL_TABLET | Freq: Every day | ORAL | Status: DC
  Administered 2016-10-07 – 2016-10-09 (×3): 20 mg via ORAL
  Filled 2016-10-07 (×2): qty 2
  Filled 2016-10-07: qty 1

## 2016-10-07 MED ORDER — ZIPRASIDONE HCL 80 MG PO CAPS
80.0000 mg | ORAL_CAPSULE | Freq: Every day | ORAL | Status: DC
  Administered 2016-10-07 – 2016-10-08 (×2): 80 mg via ORAL
  Filled 2016-10-07 (×2): qty 4
  Filled 2016-10-07: qty 1

## 2016-10-07 MED ORDER — SODIUM CHLORIDE 0.9% FLUSH: 3.0000 mL | INTRAVENOUS | Status: DC | PRN

## 2016-10-07 MED ORDER — MIRTAZAPINE 30 MG PO TBDP
30.0000 mg | ORAL_TABLET | Freq: Every day | ORAL | Status: DC
  Administered 2016-10-07 – 2016-10-08 (×2): 30 mg via ORAL
  Filled 2016-10-07 (×3): qty 1

## 2016-10-07 MED ORDER — DIVALPROEX SODIUM ER 500 MG PO TB24
1500.0000 mg | ORAL_TABLET | Freq: Every day | ORAL | Status: DC
  Administered 2016-10-07 – 2016-10-08 (×2): 1500 mg via ORAL
  Filled 2016-10-07 (×2): qty 3

## 2016-10-07 NOTE — ED Notes (Signed)
Pt requesting to be placed back on bi-pap so that she can take a nap. RT made aware + is placing pt back on bi-pap

## 2016-10-07 NOTE — ED Notes (Signed)
Ice + Ice water given to pt.

## 2016-10-07 NOTE — ED Notes (Signed)
Meal tray ordered for patient.

## 2016-10-07 NOTE — Progress Notes (Signed)
Family Medicine Teaching Service Daily Progress Note Intern Pager: [phone]  Patient name: Tiffany [name] Medical record number: [id number] Date of birth: 10/08/1960 Age: 56 y.o. Gender: female  Primary Care Provider: [name], MD Consultants: none Code Status: FULL  Pt Overview and Major Events to Date:  9/22 admitted with likely fluid overload  Assessment and Plan: Tiffany [name] is a 56 y.o. female presenting with SOB starting on day of admission . PMH is significant for end stage asthma/COPD (6L home O2 daytime, BiPAP QHS), CKDII, T2DM, HFpEF, HLD, HTN, Bipolar 1 disorder, paranoid schizophrenia,  Hx of cocaine abuse.   Acute on Chronic Respiratory failure 2/2 COPD and pulmonary HTN: Recently admitted and discharged for similar presentation, but PEA arrest on arrival at last admission. This presentation likely 2/2 fluid overload as patient states she has not been taking her torsemide as prescribed because she didn't feel it was important. Home O2 requirement is 6L [place] and BiPAPat night, which she has been using. Concomitant OSA.CTA PE negative. S/p IV lasix 40mg , solumedrol 125mg  in ED.  -continue O2 (home is 6L [place]) -duonebs q6H sch  -albuterol q2H PRN -continuous pulse ox -no additional steroids  Mildly elevated d dimer: PERC 1 due to age, [place] PE 1.5 due to previous immobilization. D dimer 0.90. Of note, previous d dimer 2 months ago 2.02 without PE findings on CTA. CTA negative.  CHF: echo 09/08/16 with EF 60-65%, normal wall motion. Fluid overloaded on exam, likely due to med noncompliance. S/p one dose IV lasix 40 as above. Often requires potassium repletion, will need to watch this. No I/o charted yet. Of note, conversion from home torsemide 60mg  QD to IV lasix should be 60mg  lasix as torsemide conversion IV to PO is 1:1 and IV lasix to IV torsemide is 1:1. As such, patient may need 60mg  IV lasix with future dosing, but will observe for urinary output.  -BMP  daily -consider additional IV lasix 40mg  this afternoon vs resuming home torsemide in am -strict I/os, daily weights  Chest pain: some mild chest pain under L breast that feels dull. Low suspicion for ACS given neg trop x 1 and unchanged EKG. Will trend trops. -trop neg x2  Anemia of chronic disease: Hemoglobin 11 03/2016. Drifting down since that time, last Hgb 8.1. No signs of bleeding on exam or history. -CBC daily  DMII: last a1c 07/05/16 5.6. At home on metformin 500mg  BID.  -moderate sliding scale -daily CBGs with BMP  Schizophrenia: stable.home meds include cogentin 0.5mg , depakote 1500mg  QD (increased by psych yesterday per patient), remeron 45mg  daily, geodon. -continue cogentin, remeron, lexapro, depakote, geodon  History of cocaine abuse: UDS negative on admission. -monitor as clinically indicated  Diabetic neuropathy: home gabapentin is 600mg  TID.  -restart home gabapentin   Closed fracture of right tibia: Seems to have missed outpatient ortho follow up. Last seen by Dr. Erlinda Hong on 09/20/16 during recent admission stating WBAT in CAM to strengthen RLE.  -PT/OT  FEN/GI: carb mod diet PPx: heparin subq given AKI  Disposition: admit to stepdown, awaiting bed  Subjective:  Patient feels well, notes significant output. Questions her general course of illness, feels like she can't do the right thing at home with meds. Told her we would see what PT says and possibly increase home health health if not SNF.   Objective: Temp:  [98.8 F (37.1 C)-99.1 F (37.3 C)] 99.1 F (37.3 C) (09/21 2302) Pulse Rate:  [90-99] 95 (09/22 0737) Resp:  [12-29]  22 (09/22 0737) BP: (107-176)/(60-120) 152/74 (09/22 0700) SpO2:  [98 %-100 %] 100 % (09/22 0827) FiO2 (%):  [44 %] 44 % (09/22 0827) Weight:  [285 lb (129.3 kg)] 285 lb (129.3 kg) (09/21 2252) Physical Exam: General: Obese female sitting up in bed in NAD. [place] in place.  Cardiovascular: RRR, no murmur, 2+ edema to shins  bilaterally Respiratory: exam limited by body habitus, crackles in bilateral bases and mild diffuse end expiratory wheezing Gastrointestinal: protuberant, soft, NTND, +BS MSK: muscle tone and bulk appropriate, RLE in CAM boot Derm: no rashes or wounds on visualized skin Neuro: CN II-XII grossly intact Psych: mood and affect appropriate  Laboratory:  Recent Labs Lab 10/06/16 2303  WBC 10.5  HGB 8.5*  HCT 28.8*  PLT 300    Recent Labs Lab 10/06/16 2303 10/07/16 0431  NA 140 141  K 4.4 4.0  CL 103 98*  CO2 29 30  BUN 7 8  CREATININE 1.07* 1.23*  CALCIUM 9.4 9.6  GLUCOSE 96 151*    Trop neg x 2.   Imaging/Diagnostic Tests:  Ct Angio Chest Pe W Or Wo Contrast  Result Date: 10/07/2016 CLINICAL DATA:  Shortness of breath. Low pretest probability for pulmonary embolus. EXAM: CT ANGIOGRAPHY CHEST WITH CONTRAST TECHNIQUE: Multidetector CT imaging of the chest was performed using the standard protocol during bolus administration of intravenous contrast. Multiplanar CT image reconstructions and MIPs were obtained to evaluate the vascular anatomy. CONTRAST:  100 mL Isovue 370 COMPARISON:  07/14/2016 FINDINGS: Cardiovascular: Contrast bolus is somewhat limited but there is moderately good opacification of the central and proximal segmental pulmonary arteries. No filling defects are demonstrated. No evidence of significant pulmonary embolus. Smaller peripheral emboli are not entirely excluded. Cardiac enlargement. No pericardial effusions. Normal caliber thoracic aorta. Mediastinum/Nodes: Mediastinal lymph nodes are not pathologically enlarged. Esophagus is decompressed Lungs/Pleura: Evaluation of the lungs is limited due to respiratory motion. There appears to be mild mosaic pattern which could indicate edema. Mild atelectasis in the lung bases. Edema pattern is improved since previous study. No pleural effusions. No pneumothorax. Upper Abdomen: No acute process demonstrated in the  visualized upper abdomen. Musculoskeletal: Degenerative changes in spine. No destructive bone lesions. Review of the MIP images confirms the above findings. IMPRESSION: 1. No evidence of significant central pulmonary embolus. 2. Cardiac enlargement with suggestion of pulmonary edema, improved since previous study. Electronically Signed   By: Lucienne Capers M.D.   On: 10/07/2016 06:05    Dg Chest Portable 1 View  Result Date: 10/06/2016 CLINICAL DATA:  Patient experiencing short of breath and LEFT chest pain for 24 hours. EXAM: PORTABLE CHEST 1 VIEW COMPARISON:  09/14/2016 FINDINGS: Normal cardiac silhouette. No effusion, infiltrate or pneumothorax. Mild central venous congestion. No acute osseous abnormality. IMPRESSION: Mild central venous congestion.  No infiltrate. Electronically Signed   By: Suzy Bouchard M.D.   On: 10/06/2016 23:53    Sela Hilding, MD 10/07/2016, 8:57 AM PGY-2, [place] Intern pager: [phone], text pages welcome

## 2016-10-07 NOTE — ED Notes (Signed)
Per respiratory pt able to come off bipap to go to CT

## 2016-10-07 NOTE — H&P (Signed)
[facility] Admission History and Physical Service Pager: [phone]  Patient name: Tiffany [name] Medical record number: [id number] Date of birth: 04-May-1960 Age: 56 y.o. Gender: female  Primary Care Provider: [name], MD Consultants: none Code Status: FULL  Chief Complaint: SOB  Assessment and Plan: Tiffany [name] is a 56 y.o. female presenting with SOB starting on day of admission . PMH is significant for end stage asthma/COPD (6L home O2 daytime, BiPAP QHS), CKDII, T2DM, HFpEF, HLD, HTN, Bipolar 1 disorder, paranoid schizophrenia,  Hx of cocaine abuse.   Acute on Chronic Respiratory failure 2/2 COPD and pulmonary HTN: Recently admitted and discharged for similar presentation, but PEA arrest on arrival at last admission. This presentation likely 2/2 fluid overload as patient states she has not been taking her torsemide as prescribed because she didn't feel it was important. Home O2 requirement is 6L [place] and BiPAP at night, which she has been using. Concomitant OSA. Follows with pulm. Home meds for COPD are albuterol, breo ellipta, singulair, spiriva. D dimer ordered as ED c/w PE 2/2 limited mobility during recent admission. PE low likelihood given history suggestive of fluid overload and stability on home O2, but cannot rule out with [facility] given age. S/p IV lasix 31m, solumedrol 1226min ED.  -originally ordered to admit to med surg, but per ED charge RN cannot go to floor with QHS BiPAP > changed order to stepdown; Dr. [place] Amberttending -continue O2 (home is 6L [place]) -duonebs q6H sch  -albuterol q2H PRN -continuous pulse ox -consider additional steroids 9/23, although anticipate this is not an asthma exacerbation   Mildly elevated d dimer: PERC 1 due to age, [place] PE 1.5 due to previous immobilization. D dimer 0.90. Of note, previous d dimer 2 months ago 2.02 without PE findings on CTA.  - CTA PE  CHF: echo 09/08/16 with EF 60-65%, normal wall  motion. Fluid overloaded on exam, likely due to med noncompliance. S/p one dose IV lasix 40 as above. Often requires potassium repletion, will need to watch this. No I/o charted yet. Of note, conversion from home torsemide 6062mD to IV lasix should be 28m72msix as torsemide conversion IV to PO is 1:1 and IV lasix to IV torsemide is 1:1. As such, patient may need 28mg84mlasix with future dosing, but will observe for urinary output.  -BMP in am -reassess fluid status in am -strict I/os, daily weights  Chest pain: some mild chest pain under L breast that feels dull. Low suspicion for ACS given neg trop x 1 and unchanged EKG. Will trend trops and get repeat EKG.  -trop x 3 -EKG in am  Anemia of chronic disease: Hemoglobin 11 03/2016. Drifting down since that time, last Hgb 8.1. No signs of bleeding on exam or history. -CBC daily  DMII: last a1c 07/05/16 5.6. At home on metformin 500mg 3m  -moderate sliding scale -daily CBGs with BMP  Schizophrenia: stable. home meds include cogentin 0.5mg, d74mkote 1500mg QD68mcreased by psych yesterday per patient), remeron 45mg dai54mgeodon. -continue cogentin, remeron, lexapro, depakote, geodon  History of cocaine abuse: UDS negative on admission. -monitor as clinically indicated  Diabetic neuropathy: home gabapentin is 600mg TID.31mestart home gabapentin   Closed fracture of right tibia: Seems to have missed outpatient ortho follow up. Last seen by Dr. Xu on 9/5/Erlinda Hong during recent admission stating WBAT in CAM to strengthen RLE.  -PT/OT  FEN/GI: carb mod diet PPx: heparin subq given AKI  Disposition: admit to stepdown  History of Present Illness:  Tiffany [name] is a 56 y.o. female presenting with SOB x 1 day. She states she has not been taking her torsemide because she didn't feel it was important. She has been taking it about every other day. She reports that she felt her body swell up and she knew she was gaining weight and retaining  fluid. She does report good compliance with BiPAP QHS. Dyspnea worse with laying flat and worse with exertion. Does endorse some mild CP L under her breast, feels dull and achy. No worsening of CP with exertion. Patient recalls history of palliative care consult, but can't remember what they are for. States they recently came to her house and hung up a paper saying she wants all resuscitation.   Review Of Systems: Per HPI with the following additions: denies HA, fever, rash, abd pain, weakness.  ROS  Patient Active Problem List   Diagnosis Date Noted  . Acute left ankle pain   . Acute on chronic systolic congestive heart failure ([place])   . Cardiac arrest ([place]) 09/08/2016  . History of open reduction and internal fixation (ORIF) procedure   . Closed displaced transverse fracture of shaft of right tibia   . Fall   . Overactive bladder 06/07/2016  . QT prolongation   . OSA and COPD overlap syndrome ([place])   . Generalized weakness   . Arthritis   . Bipolar I disorder ([place])   . Acute heart failure ([place])   . Benign essential HTN 03/22/2016  . Bipolar affective disorder, mixed, severe, with psychotic behavior ([place]) 11/28/2015  . Cocaine use disorder, severe, dependence ([place]) 11/28/2015  . AKI (acute kidney injury) ([place]) 11/24/2015  . Chronic diastolic heart failure ([place])   . Chronic respiratory failure with hypoxia and hypercapnia ([place]) 06/22/2015  . Acute respiratory failure with hypoxia and hypercapnia ([place]) 06/22/2015  . Onychomycosis 01/21/2015  . Tobacco use disorder 07/22/2014  . COPD (chronic obstructive pulmonary disease) ([place]) 07/08/2014  . Knee pain, bilateral 01/22/2013  . Seizure ([place]) 01/04/2013  . Health care maintenance 11/25/2012  . History of kidney stones 06/18/2012  . Chronic pain syndrome 06/18/2012  . Allergic reaction 04/07/2012  . HSV infection 08/30/2011  . Dyslipidemia 04/24/2011  . Anemia 04/24/2011  . Diabetic neuropathy ([place]) 04/24/2011  . Obstructive sleep  apnea 10/18/2010  . Asthma 10/18/2010  . Morbid obesity ([place]) 10/18/2010  . Type 2 diabetes mellitus ([place]) 10/18/2010    Past Medical History: Past Medical History:  Diagnosis Date  . Anxiety   . Arthritis    "all over" (04/10/2016)  . Asthma   . Cardiac arrest ([place]) 09/08/2016   PEA  . CHF (congestive heart failure) ([place])   . Chronic bronchitis ([place])   . Chronic kidney disease    "I see a kidney dr." (04/10/2016)  . Cocaine abuse   . Complication of anesthesia    decreased bp, decreased heart rate  . Depression   . Disorder of nervous system   . Emphysema   . GERD (gastroesophageal reflux disease)   . Heart attack ([place]) 1980s  . History of blood transfusion 1994   "couldn't stop bleeding from my period"  . Hyperlipidemia LDL goal <70   . Hypertension   . Incontinence   . Manic depression ([place])   . On home oxygen therapy    "6L; 24/7" (04/10/2016)  . OSA on CPAP    "wear mask sometimes" (04/10/2016)  . Paranoid ([place])    "  sometimes; I'm on RX for it" (04/10/2016)  . Pneumonia    "I've had it several times; haven't had it since 06/2015" (04/10/2016)  . Schizophrenia ([place])   . Seasonal allergies   . Seizures ([place])    "don't know what kind; last one was ~ 1 yr ago" (04/10/2016)  . Sinus trouble   . Stroke [facility]) 1980s   denies residual on 04/10/2016  . Type II diabetes mellitus ([place])     Past Surgical History: Past Surgical History:  Procedure Laterality Date  . CESAREAN SECTION  1997  . HERNIA REPAIR    . IR CHOLANGIOGRAM EXISTING TUBE  07/20/2016  . IR PERC CHOLECYSTOSTOMY  05/10/2016  . IR RADIOLOGIST EVAL & MGMT  06/08/2016  . IR SINUS/FIST TUBE CHK-NON GI  07/12/2016  . TIBIA IM NAIL INSERTION Right 07/12/2016   Procedure: INTRAMEDULLARY (IM) NAIL RIGHT TIBIA;  Surgeon: Leandrew Koyanagi, MD;  Location: [facility];  Service: Orthopedics;  Laterality: Right;  . UMBILICAL HERNIA REPAIR  ~ 1963   "that's why I don't have a belly button"  . VAGINAL HYSTERECTOMY      Social  History: Social History  Substance Use Topics  . Smoking status: Former Smoker    Packs/day: 1.50    Years: 38.00    Types: Cigarettes    Start date: 03/13/1977    Quit date: 04/10/2016  . Smokeless tobacco: Never Used  . Alcohol use No   Additional social history: lives with her mom, denies recent drug use  Please also refer to relevant sections of EMR.  Family History: Family History  Problem Relation Age of Onset  . Cancer Father        prostate  . Cancer Mother        lung  . Depression Mother   . Depression Sister   . Anxiety disorder Sister   . Schizophrenia Sister   . Bipolar disorder Sister   . Depression Sister   . Depression Brother   . Heart failure Unknown        cousin    Allergies and Medications: Allergies  Allergen Reactions  . Hydroxyzine Shortness Of Breath    -throat closed up  . Latuda [Lurasidone Hcl] Anaphylaxis  . Codeine Nausea And Vomiting  . Sulfa Antibiotics Itching   No current facility-administered medications on file prior to encounter.    Current Outpatient Prescriptions on File Prior to Encounter  Medication Sig Dispense Refill  . albuterol (PROVENTIL) (2.5 MG/3ML) 0.083% nebulizer solution Take 3 mLs (2.5 mg total) by nebulization every 6 (six) hours as needed for wheezing or shortness of breath. 150 mL 1  . amLODipine (NORVASC) 5 MG tablet Take 1 tablet (5 mg total) by mouth daily. 90 tablet 0  . aspirin 81 MG EC tablet Take 1 tablet (81 mg total) by mouth daily. Swallow whole. 90 tablet 0  . atorvastatin (LIPITOR) 40 MG tablet Take 1 tablet (40 mg total) by mouth daily at bedtime 60 tablet 3  . Blood Glucose Monitoring Suppl (ACCU-CHEK AVIVA PLUS) w/Device KIT CHECK BLOOD SUGAR THREE TIMES DAILY 1 kit 0  . divalproex ([name] ER) 500 MG 24 hr tablet Take 3 tablets (1,500 mg total) by mouth at bedtime. 90 tablet 1  . escitalopram (LEXAPRO) 20 MG tablet Take 1 tablet (20 mg total) by mouth daily. 30 tablet 1  . fluticasone  furoate-vilanterol (BREO ELLIPTA) 100-25 MCG/INH AEPB INHALE 1 PUFF EVERY DAY 180 each 0  . gabapentin (NEURONTIN) 600 MG tablet  Take 1 tablet (600 mg total) by mouth 3 (three) times daily. 270 tablet 0  . metFORMIN (GLUCOPHAGE-XR) 500 MG 24 hr tablet Take 1 tablet (500 mg total) by mouth 2 (two) times daily. 180 tablet 0  . mirtazapine (REMERON SOL-TAB) 30 MG disintegrating tablet Take 1 tablet (30 mg total) by mouth at bedtime. 30 tablet 1  . montelukast (SINGULAIR) 10 MG tablet Take 1 tablet (10 mg total) by mouth at bedtime. 90 tablet 1  . Multiple Vitamin (MULTIVITAMIN WITH MINERALS) TABS tablet Take 1 tablet by mouth daily.    [name] omeprazole (PRILOSEC) 40 MG capsule Take 1 capsule (40 mg total) by mouth 2 (two) times daily. 180 capsule 0  . potassium chloride SA (K-DUR,KLOR-CON) 20 MEQ tablet Take 1 tablet (20 mEq total) by mouth 2 (two) times daily. 150 tablet 2  . tiotropium (SPIRIVA HANDIHALER) 18 MCG inhalation capsule Place 1 capsule (18 mcg total) into inhaler and inhale daily. 90 capsule 1  . torsemide (DEMADEX) 20 MG tablet Take 3 tablets (60 mg total) by mouth daily. 120 tablet 0  . VENTOLIN HFA 108 (90 Base) MCG/ACT inhaler Inhale 2 puffs into the lungs every 6 (six) hours as needed for wheezing or shortness of breath.     . ziprasidone (GEODON) 80 MG capsule Take 1 capsule (80 mg total) by mouth at bedtime. 30 capsule 1  . Alcohol Swabs (B-D SINGLE USE SWABS REGULAR) PADS CHECK CAPILLARY BLOOD GLUCOSE ONE TIME DAILY 100 each 3  . glucose blood test strip Use to check blood sugar 3 times daily. 100 each 12  . OXYGEN Inhale 6 L into the lungs daily.      Objective: BP 133/72   Pulse 92   Temp 99.1 F (37.3 C) (Rectal)   Resp 18   Ht _0  (1.626 m)   Wt 285 lb (129.3 kg)   SpO2 99%   BMI 48.92 kg/m  Exam: General: Obese female sitting up in bed in NAD. [place] in place.  Eyes: EOMI, PEERLA ENTM: MMM, poor dentition  Neck: supple Cardiovascular: RRR, no murmur Respiratory:  exam limited by body habitus, crackles in bilateral bases and mild diffuse end expiratory wheezing Gastrointestinal: protuberant, soft, NTND, +BS MSK: muscle tone and bulk appropriate, RLE in CAM boot Derm: no rashes or wounds on visualized skin Neuro: CN II-XII grossly intact Psych: mood and affect appropriate, mood maybe slightly more flat than previous admissions  Labs and Imaging: CBC BMET   Recent Labs Lab 10/06/16 2303  WBC 10.5  HGB 8.5*  HCT 28.8*  PLT 300    Recent Labs Lab 10/06/16 2303  NA 140  K 4.4  CL 103  CO2 29  BUN 7  CREATININE 1.07*  GLUCOSE 96  CALCIUM 9.4      Dg Chest Portable 1 View  Result Date: 10/06/2016 CLINICAL DATA:  Patient experiencing short of breath and LEFT chest pain for 24 hours. EXAM: PORTABLE CHEST 1 VIEW COMPARISON:  09/14/2016 FINDINGS: Normal cardiac silhouette. No effusion, infiltrate or pneumothorax. Mild central venous congestion. No acute osseous abnormality. IMPRESSION: Mild central venous congestion.  No infiltrate. Electronically Signed   By: Suzy Bouchard M.D.   On: 10/06/2016 23:53    Sela Hilding, MD 10/07/2016, 1:09 AM PGY-2, [facility] pager: [phone], text pages welcome

## 2016-10-07 NOTE — ED Notes (Signed)
Attempted report X2 

## 2016-10-07 NOTE — ED Notes (Signed)
Attempted report 

## 2016-10-07 NOTE — Progress Notes (Signed)
Patient asked to be placed on BiPAP for sleep.

## 2016-10-07 NOTE — Progress Notes (Signed)
Patient wanted to come off of BiPAP and back on [facility].

## 2016-10-07 NOTE — Progress Notes (Signed)
Patient wanted to come off of BiPAP and be placed on [facility] per home regimen.

## 2016-10-07 NOTE — ED Notes (Signed)
Attempted report X3 

## 2016-10-08 LAB — BASIC METABOLIC PANEL
Anion gap: 9 (ref 5–15)
BUN: 15 mg/dL (ref 6–20)
CO2: 35 mmol/L — ABNORMAL HIGH (ref 22–32)
Calcium: 8.9 mg/dL (ref 8.9–10.3)
Chloride: 96 mmol/L — ABNORMAL LOW (ref 101–111)
Creatinine, Ser: 1.27 mg/dL — ABNORMAL HIGH (ref 0.44–1.00)
GFR calc Af Amer: 53 mL/min — ABNORMAL LOW (ref >60)
GFR calc non Af Amer: 46 mL/min — ABNORMAL LOW (ref >60)
Glucose, Bld: 108 mg/dL — ABNORMAL HIGH (ref 65–99)
Potassium: 3.9 mmol/L (ref 3.5–5.1)
Sodium: 140 mmol/L (ref 135–145)

## 2016-10-08 LAB — CBC
HCT: 27.5 % — ABNORMAL LOW (ref 36.0–46.0)
Hemoglobin: 8 g/dL — ABNORMAL LOW (ref 12.0–15.0)
MCH: 27.9 pg (ref 26.0–34.0)
MCHC: 29.1 g/dL — ABNORMAL LOW (ref 30.0–36.0)
MCV: 95.8 fL (ref 78.0–100.0)
Platelets: 309 10*3/uL (ref 150–400)
RBC: 2.87 MIL/uL — ABNORMAL LOW (ref 3.87–5.11)
RDW: 16.6 % — ABNORMAL HIGH (ref 11.5–15.5)
WBC: 10 10*3/uL (ref 4.0–10.5)

## 2016-10-08 LAB — GLUCOSE, CAPILLARY
Glucose-Capillary: 91 mg/dL (ref 65–99)
Glucose-Capillary: 98 mg/dL (ref 65–99)
Glucose-Capillary: 107 mg/dL — ABNORMAL HIGH (ref 65–99)
Glucose-Capillary: 116 mg/dL — ABNORMAL HIGH (ref 65–99)
Glucose-Capillary: 104 mg/dL — ABNORMAL HIGH (ref 65–99)

## 2016-10-08 MED ORDER — IPRATROPIUM-ALBUTEROL 0.5-2.5 (3) MG/3ML IN SOLN
3.0000 mL | Freq: Three times a day (TID) | RESPIRATORY_TRACT | Status: DC
  Administered 2016-10-08 – 2016-10-09 (×6): 3 mL via RESPIRATORY_TRACT
  Filled 2016-10-08 (×6): qty 3

## 2016-10-08 MED ORDER — TORSEMIDE 20 MG PO TABS
20.0000 mg | ORAL_TABLET | Freq: Three times a day (TID) | ORAL | Status: DC
  Administered 2016-10-08 – 2016-10-09 (×5): 20 mg via ORAL
  Filled 2016-10-08 (×5): qty 1

## 2016-10-08 NOTE — Evaluation (Signed)
Occupational Therapy Evaluation and Discharge Patient Details Name: Tiffany [name] MRN: [id number] DOB: 1960/10/08 Today's Date: 10/08/2016    History of Present Illness Tiffany [name] is a 56 y.o. female presenting with SOB starting on day of admission . PMH is significant for end stage asthma/COPD (6L home O2 daytime, BiPAP QHS), CKDII, T2DM, HFpEF, HLD, HTN, Bipolar 1 disorder, paranoid schizophrenia,  Hx of cocaine abuse.    Clinical Impression   PTA Pt recovering from previous hospitalization and required mod A for dressing/bathing, set up for grooming/eating, and ambulating with RW and CAM boot. Pt is currently at those same levels. Today session focused on toilet transfers, and peri care with toilet aide (provided for Pt), and grooming. Pt is relying on her mother and children currently to assist her, and she is motivated to maximize her independence. Pt would benefit from skilled OT in the acute setting to achieve maximum indepedence in ADL and functional transfers. Next session to focus on further AE education to assist with bathing/dressing due to extra body habitus, and her LE injury, as well as practice tub bench transfer (pt is currently sponge bathing but would like to progress) Pt will benefit from skilled OT at the [facility] level to maximize independence in her own home environment as well as perform a falls assessment.     Follow Up Recommendations  Supervision - Intermittent;Home health OT    Equipment Recommendations  None recommended by OT (Pt provided with toilet aide)    Recommendations for Other Services       Precautions / Restrictions Precautions Precautions: Fall Precaution Comments: watch O2 Required Braces or Orthoses: Other Brace/Splint Other Brace/Splint: CAM boot - can wean as tolerated Restrictions Weight Bearing Restrictions: Yes RLE Weight Bearing: Weight bearing as tolerated      Mobility Bed Mobility               General bed mobility comments:  Pt sitting OOB in recliner when OT arrived  Transfers Overall transfer level: Needs assistance Equipment used: Rolling walker (2 wheeled) Transfers: Sit to/from Stand Sit to Stand: Min guard         General transfer comment: min guard from recliner and toilet    Balance Overall balance assessment: Needs assistance   Sitting balance-Leahy Scale: Good     Standing balance support: Bilateral upper extremity supported Standing balance-Leahy Scale: Poor Standing balance comment: relies on support from RW during dynamic movement; fair balance in static standing                           ADL either performed or assessed with clinical judgement   ADL Overall ADL's : Needs assistance/impaired Eating/Feeding: Set up;Sitting   Grooming: Set up;Sitting;Wash/dry hands   Upper Body Bathing: Moderate assistance;With caregiver independent assisting;Sitting   Lower Body Bathing: Maximal assistance;Sit to/from stand   Upper Body Dressing : Minimal assistance   Lower Body Dressing: Maximal assistance;Sit to/from stand   Toilet Transfer: Min guard;Ambulation;RW;Comfort height toilet;Grab bars;Requires wide/bariatric Toilet Transfer Details (indicate cue type and reason): in bathroom, vc for safety with walker Toileting- Clothing Manipulation and Hygiene: Total assistance;Sit to/from stand Toileting - Clothing Manipulation Details (indicate cue type and reason): Pt provided with toilet aide, and educated in use for front and rear peri care - demonstrated wrapping technique for paper x3 Tub/ Shower Transfer: Moderate assistance   Functional mobility during ADLs: Min guard;Rolling walker General ADL Comments: Pt motivated to continue to increase  independence. SO EXCITED about toilet aide     Vision Baseline Vision/History: Wears glasses Wears Glasses: At all times Patient Visual Report: No change from baseline       Perception     Praxis      Pertinent Vitals/Pain Pain  Assessment: Faces Faces Pain Scale: Hurts a little bit Pain Location: right leg Pain Descriptors / Indicators: Discomfort Pain Intervention(s): Monitored during session;Repositioned     Hand Dominance Right   Extremity/Trunk Assessment Upper Extremity Assessment Upper Extremity Assessment: Overall WFL for tasks assessed   Lower Extremity Assessment Lower Extremity Assessment: Defer to PT evaluation RLE Deficits / Details: in CAM boot, but able to WB through without problem   Cervical / Trunk Assessment Cervical / Trunk Assessment: Other exceptions Cervical / Trunk Exceptions: Increased body habitus   Communication Communication Communication: No difficulties   Cognition Arousal/Alertness: Awake/alert Behavior During Therapy: WFL for tasks assessed/performed Overall Cognitive Status: Within Functional Limits for tasks assessed                                     General Comments       Exercises     Shoulder Instructions      Home Living Family/patient expects to be discharged to:: Private residence Living Arrangements: Parent;Children Available Help at Discharge: Family;Available 24 hours/day Type of Home: House Home Access: Ramped entrance     Home Layout: One level     Bathroom Shower/Tub: Tub/shower unit (although since being in boot has been sponge bathing)   Bathroom Toilet: Standard Bathroom Accessibility: Yes How Accessible: Accessible via walker Home Equipment: Grab bars - tub/shower;Cane - single point;Walker - 2 wheels;Bedside commode;Walker - 4 wheels;Wheelchair - manual (sliding board)   Additional Comments: home O2-6L      Prior Functioning/Environment Level of Independence: Needs assistance  Gait / Transfers Assistance Needed: RW and cam boot have just been working on small ambualtion ADL's / Homemaking Assistance Needed: family assist with sponge baths and dressing            OT Problem List: Decreased strength;Decreased  range of motion;Decreased activity tolerance;Impaired balance (sitting and/or standing);Decreased safety awareness;Decreased knowledge of use of DME or AE;Decreased knowledge of precautions;Pain;Obesity      OT Treatment/Interventions: Self-care/ADL training;Therapeutic exercise;Energy conservation;DME and/or AE instruction;Therapeutic activities;Patient/family education    OT Goals(Current goals can be found in the care plan section) Acute Rehab OT Goals Patient Stated Goal: get back home OT Goal Formulation: With patient Time For Goal Achievement: 10/22/16 Potential to Achieve Goals: Good ADL Goals Pt Will Perform Upper Body Bathing: with set-up;with adaptive equipment;with caregiver independent in assisting;sitting Pt Will Perform Lower Body Bathing: with set-up;with caregiver independent in assisting;with adaptive equipment;sitting/lateral leans Pt Will Transfer to Toilet: with supervision;ambulating;regular height toilet (with RW) Pt Will Perform Toileting - Clothing Manipulation and hygiene: with set-up;with caregiver independent in assisting;with adaptive equipment;sit to/from stand (demonstrating correct paper technique) Pt Will Perform Tub/Shower Transfer: Tub transfer;with min guard assist;with caregiver independent in assisting;tub bench;rolling walker  OT Frequency: Min 2X/week   Barriers to D/C:            Co-evaluation              AM-PAC PT "6 Clicks" Daily Activity     Outcome Measure Help from another person eating meals?: None Help from another person taking care of personal grooming?: None Help from another person toileting, which includes  using toliet, bedpan, or urinal?: A Little Help from another person bathing (including washing, rinsing, drying)?: A Lot Help from another person to put on and taking off regular upper body clothing?: A Little Help from another person to put on and taking off regular lower body clothing?: A Lot 6 Click Score: 18   End of  Session Equipment Utilized During Treatment: Oxygen;Rolling walker;Other (comment) (6L; CAM boot) Nurse Communication: Mobility status  Activity Tolerance: Patient tolerated treatment well Patient left: in chair;with call bell/phone within reach  OT Visit Diagnosis: Unsteadiness on feet (R26.81);Other abnormalities of gait and mobility (R26.89);Muscle weakness (generalized) (M62.81);Pain Pain - Right/Left: Right Pain - part of body: Leg                Time: 1620-1640 OT Time Calculation (min): 20 min Charges:  OT General Charges $OT Visit: 1 Visit OT Evaluation $OT Eval Moderate Complexity: 1 Mod G-Codes:    Hulda Humphrey OTR/L [place] 10/08/2016, 5:26 PM

## 2016-10-08 NOTE — Progress Notes (Signed)
Pt transferred to 5W with bed and all personal belongings. Report called and given. Respiratory aware.

## 2016-10-08 NOTE — Progress Notes (Signed)
Family Medicine Teaching Service Daily Progress Note Intern Pager: [phone]  Patient name: Tiffany [name] Medical record number: [id number] Date of birth: [birth year] Age: 56 y.o. Gender: female  Primary Care Provider: [name], MD Consultants: none Code Status: FULL  Pt Overview and Major Events to Date:  9/22 admitted with likely fluid overload  Assessment and Plan: Tiffany [name] is a 56 y.o. female presenting with SOB starting on day of admission . PMH is significant for end stage asthma/COPD (6L home O2 daytime, BiPAP QHS), CKDII, T2DM, HFpEF, HLD, HTN, Bipolar 1 disorder, paranoid schizophrenia,  Hx of cocaine abuse.   Acute on Chronic Respiratory failure 2/2 COPD and pulmonary HTN, improving:  Likely 2/2 fluid overload due to non-compliance with torsemide. -continue O2 (home is 6L [place]) - bipap at night -duonebs q6H sch  -albuterol q2H PRN -continuous pulse ox  CHF: echo 09/08/16 with EF 60-65%, normal wall motion. Fluid overloaded on exam, likely due to med noncompliance. Patient received one dose IV lasix 40mg  at [facility] on 9/22 and continues with good diuresis, net 2L over past 24hrs.  -BMP daily -will likely resume home torsemide -strict I/os, daily weights  Chest pain: some mild chest pain under L breast that feels dull. Low suspicion for ACS given neg trop x 1 and unchanged EKG.  Troponin neg x3 - continue to follow  Anemia of chronic disease: Hemoglobin 11 03/2016. Drifting down since that time, last Hgb 8.0. No signs of bleeding on exam or history. - follow up CBC  DMII: last a1c 07/05/16 5.6. Holding home metformin. Has not required any short acting insulin since admission.  -moderate sliding scale -daily CBGs with BMP  Schizophrenia: stable.home meds include cogentin 0.5mg , depakote 1500mg  QD (increased by psych yesterday per patient), remeron 45mg  daily, geodon. -continue cogentin, remeron, lexapro, depakote, geodon  History of cocaine abuse:  UDS negative on admission. -monitor as clinically indicated  Diabetic neuropathy: home gabapentin is 600mg  TID.  - cont home gabapentin  Closed fracture of right tibia: Seems to have missed outpatient ortho follow up. Last seen by Dr. Erlinda Hong on 09/20/16 during recent admission stating WBAT in CAM to strengthen RLE.  -PT/OT  FEN/GI: carb mod diet PPx: heparin subq given AKI  Disposition: Will unfortunately require stepdown for duration of admission d/t bipap use at night. Multiple recent admissions, will continue to push palliative care.   Subjective:  Patient feels well and denies any CP or significant SOB.  Objective: Temp:  [98.9 F (37.2 C)-99 F (37.2 C)] 98.9 F (37.2 C) (09/22 2000) Pulse Rate:  [64-100] 64 (09/23 0415) Resp:  [14-23] 16 (09/23 0415) BP: (118-165)/(68-95) 119/79 (09/23 0400) SpO2:  [97 %-100 %] 98 % (09/23 0415) FiO2 (%):  [44 %] 44 % (09/22 1345) Weight:  [297 lb 2.9 oz (134.8 kg)-297 lb 13.5 oz (135.1 kg)] 297 lb 2.9 oz (134.8 kg) (09/23 0500) Physical Exam: General: Obese female sitting up in bed with [place] in place Cardiovascular: RRR, no murmur, 1+ edema to shins bilaterally Respiratory: NWOB, CTABL, but breath sounds distant 2/2 body habitus, no wheezing or rhonci Gastrointestinal: obese abdomen, SNTND MSK: no gross deformities or edema, RLE very TTP 2/2 fracture  Laboratory:  Recent Labs Lab 10/06/16 2303  WBC 10.5  HGB 8.5*  HCT 28.8*  PLT 300    Recent Labs Lab 10/06/16 2303 10/07/16 0431 10/08/16 0349  NA 140 141 140  K 4.4 4.0 3.9  CL 103 98* 96*  CO2 29 30 35*  BUN 7 8 15   CREATININE 1.07* 1.23* 1.27*  CALCIUM 9.4 9.6 8.9  GLUCOSE 96 151* 108*    Trop neg x 2.   Imaging/Diagnostic Tests:  Ct Angio Chest Pe W Or Wo Contrast  Result Date: 10/07/2016 CLINICAL DATA:  Shortness of breath. Low pretest probability for pulmonary embolus. EXAM: CT ANGIOGRAPHY CHEST WITH CONTRAST TECHNIQUE: Multidetector CT imaging of the chest was  performed using the standard protocol during bolus administration of intravenous contrast. Multiplanar CT image reconstructions and MIPs were obtained to evaluate the vascular anatomy. CONTRAST:  100 mL Isovue 370 COMPARISON:  07/14/2016 FINDINGS: Cardiovascular: Contrast bolus is somewhat limited but there is moderately good opacification of the central and proximal segmental pulmonary arteries. No filling defects are demonstrated. No evidence of significant pulmonary embolus. Smaller peripheral emboli are not entirely excluded. Cardiac enlargement. No pericardial effusions. Normal caliber thoracic aorta. Mediastinum/Nodes: Mediastinal lymph nodes are not pathologically enlarged. Esophagus is decompressed Lungs/Pleura: Evaluation of the lungs is limited due to respiratory motion. There appears to be mild mosaic pattern which could indicate edema. Mild atelectasis in the lung bases. Edema pattern is improved since previous study. No pleural effusions. No pneumothorax. Upper Abdomen: No acute process demonstrated in the visualized upper abdomen. Musculoskeletal: Degenerative changes in spine. No destructive bone lesions. Review of the MIP images confirms the above findings. IMPRESSION: 1. No evidence of significant central pulmonary embolus. 2. Cardiac enlargement with suggestion of pulmonary edema, improved since previous study. Electronically Signed   By: Lucienne Capers M.D.   On: 10/07/2016 06:05    Dg Chest Portable 1 View  Result Date: 10/06/2016 CLINICAL DATA:  Patient experiencing short of breath and LEFT chest pain for 24 hours. EXAM: PORTABLE CHEST 1 VIEW COMPARISON:  09/14/2016 FINDINGS: Normal cardiac silhouette. No effusion, infiltrate or pneumothorax. Mild central venous congestion. No acute osseous abnormality. IMPRESSION: Mild central venous congestion.  No infiltrate. Electronically Signed   By: Suzy Bouchard M.D.   On: 10/06/2016 23:53    Eloise Levels, MD 10/08/2016, 8:20 AM PGY-2,  [place] Intern pager: [phone], text pages welcome

## 2016-10-08 NOTE — Evaluation (Signed)
Physical Therapy Evaluation Patient Details Name: Tiffany [name] MRN: [id number] DOB: [birth year] Today's Date: 10/08/2016   History of Present Illness  Tiffany [name] is a 56 y.o. female presenting with SOB starting on day of admission . PMH is significant for end stage asthma/COPD (6L home O2 daytime, BiPAP QHS), CKDII, T2DM, HFpEF, HLD, HTN, Bipolar 1 disorder, paranoid schizophrenia,  Hx of cocaine abuse.   Clinical Impression  Patient presents with problems listed below.  Will benefit from acute PT to maximize functional mobility prior to discharge home with family.  Recommend HHPT for continued therapy at d/c.    Follow Up Recommendations Home health PT;Supervision - Intermittent    Equipment Recommendations  Other (comment) (Shower seat (patient requests))    Recommendations for Other Services       Precautions / Restrictions Precautions Precautions: Fall Precaution Comments: watch O2 Required Braces or Orthoses: Other Brace/Splint Other Brace/Splint: CAM boot - can wean as tolerated Restrictions Weight Bearing Restrictions: Yes RLE Weight Bearing: Weight bearing as tolerated      Mobility  Bed Mobility               General bed mobility comments: Patient in chair as PT entered room  Transfers Overall transfer level: Needs assistance Equipment used: Rolling walker (2 wheeled) Transfers: Sit to/from Stand Sit to Stand: Min assist;+2 safety/equipment         General transfer comment: Verbal cues for hand placement and technique.  Assist to rise to stance for stability.  Assist to control descent into recliner to return to sitting.  Ambulation/Gait Ambulation/Gait assistance: Min assist;+2 safety/equipment (chair behind patient, and lines) Ambulation Distance (Feet): 15 Feet Assistive device: Rolling walker (2 wheeled) Gait Pattern/deviations: Step-through pattern;Decreased step length - right;Decreased step length - left;Decreased stride length;Decreased  weight shift to right;Trunk flexed Gait velocity: decreased Gait velocity interpretation: Below normal speed for age/gender General Gait Details: Assist for balance/safety.  Ambulated on 6L O2 with SaO2 remaining in 90's.  Stairs            Wheelchair Mobility    Modified Rankin (Stroke Patients Only)       Balance Overall balance assessment: Needs assistance Sitting-balance support: No upper extremity supported;Feet supported Sitting balance-Leahy Scale: Good     Standing balance support: Bilateral upper extremity supported Standing balance-Leahy Scale: Poor Standing balance comment: Relies on UE support on RW, and external assist for dynamic balance.                             Pertinent Vitals/Pain Pain Assessment: Faces Faces Pain Scale: Hurts a little bit Pain Location: right leg Pain Descriptors / Indicators: Discomfort Pain Intervention(s): Limited activity within patient's tolerance;Monitored during session;Repositioned    Home Living Family/patient expects to be discharged to:: Private residence Living Arrangements: Children;Non-relatives/Friends (Son and his friend) Available Help at Discharge: Family;Available 24 hours/day Type of Home: House Home Access: Ramped entrance     Home Layout: One level Home Equipment: Grab bars - tub/shower;Cane - single point;Walker - 2 wheels;Bedside commode;Walker - 4 wheels;Wheelchair - manual (sliding board) Additional Comments: home O2-6L    Prior Function Level of Independence: Needs assistance   Gait / Transfers Assistance Needed: Using w/c most of the time.  Has just begun ambulation with RW for short distances.  ADL's / Homemaking Assistance Needed: family assist with sponge baths and dressing        Hand Dominance   Dominant Hand:  Right    Extremity/Trunk Assessment   Upper Extremity Assessment Upper Extremity Assessment: Defer to OT evaluation    Lower Extremity Assessment Lower  Extremity Assessment: RLE deficits/detail RLE Deficits / Details: in CAM boot, and able to WB through RLE;  Able to move RLE against gravity at hip and knee. RLE: Unable to fully assess due to immobilization    Cervical / Trunk Assessment Cervical / Trunk Assessment: Other exceptions Cervical / Trunk Exceptions: Increased body habitus  Communication   Communication: No difficulties  Cognition Arousal/Alertness: Awake/alert Behavior During Therapy: WFL for tasks assessed/performed Overall Cognitive Status: Within Functional Limits for tasks assessed                                        General Comments      Exercises     Assessment/Plan    PT Assessment Patient needs continued PT services  PT Problem List Decreased strength;Decreased activity tolerance;Decreased balance;Decreased mobility;Decreased knowledge of use of DME;Decreased knowledge of precautions;Obesity;Pain       PT Treatment Interventions DME instruction;Gait training;Functional mobility training;Therapeutic activities;Therapeutic exercise;Patient/family education    PT Goals (Current goals can be found in the Care Plan section)  Acute Rehab PT Goals Patient Stated Goal: get back home PT Goal Formulation: With patient Time For Goal Achievement: 10/15/16 Potential to Achieve Goals: Good    Frequency Min 3X/week   Barriers to discharge        Co-evaluation               AM-PAC PT "6 Clicks" Daily Activity  Outcome Measure Difficulty turning over in bed (including adjusting bedclothes, sheets and blankets)?: A Little Difficulty moving from lying on back to sitting on the side of the bed? : A Lot Difficulty sitting down on and standing up from a chair with arms (e.g., wheelchair, bedside commode, etc,.)?: A Little Help needed moving to and from a bed to chair (including a wheelchair)?: A Little Help needed walking in hospital room?: A Little Help needed climbing 3-5 steps with a  railing? : Total 6 Click Score: 15    End of Session Equipment Utilized During Treatment: Gait belt;Oxygen Activity Tolerance: Patient tolerated treatment well;Patient limited by fatigue Patient left: in chair;with call bell/phone within reach;with family/visitor present Nurse Communication: Mobility status PT Visit Diagnosis: Unsteadiness on feet (R26.81);Other abnormalities of gait and mobility (R26.89);Muscle weakness (generalized) (M62.81);Pain Pain - Right/Left: Right Pain - part of body: Leg    Time: 1350-1400 PT Time Calculation (min) (ACUTE ONLY): 10 min   Charges:   PT Evaluation $PT Eval Moderate Complexity: 1 Mod     PT G Codes:        Carita Pian. Sanjuana Kava, [facility] Acute Rehab Services Pager [place] 10/08/2016, 7:49 PM

## 2016-10-09 ENCOUNTER — Other Ambulatory Visit: Payer: Self-pay | Admitting: *Deleted

## 2016-10-09 ENCOUNTER — Ambulatory Visit: Payer: Medicare HMO | Admitting: *Deleted

## 2016-10-09 DIAGNOSIS — I10 Essential (primary) hypertension: Secondary | ICD-10-CM

## 2016-10-09 LAB — CBC
HCT: 28.3 % — ABNORMAL LOW (ref 36.0–46.0)
Hemoglobin: 8.2 g/dL — ABNORMAL LOW (ref 12.0–15.0)
MCH: 27.9 pg (ref 26.0–34.0)
MCHC: 29 g/dL — ABNORMAL LOW (ref 30.0–36.0)
MCV: 96.3 fL (ref 78.0–100.0)
Platelets: 317 10*3/uL (ref 150–400)
RBC: 2.94 MIL/uL — ABNORMAL LOW (ref 3.87–5.11)
RDW: 16.9 % — ABNORMAL HIGH (ref 11.5–15.5)
WBC: 9.1 10*3/uL (ref 4.0–10.5)

## 2016-10-09 LAB — BASIC METABOLIC PANEL
Anion gap: 9 (ref 5–15)
BUN: 16 mg/dL (ref 6–20)
CO2: 36 mmol/L — ABNORMAL HIGH (ref 22–32)
Calcium: 9 mg/dL (ref 8.9–10.3)
Chloride: 97 mmol/L — ABNORMAL LOW (ref 101–111)
Creatinine, Ser: 1.24 mg/dL — ABNORMAL HIGH (ref 0.44–1.00)
GFR calc Af Amer: 55 mL/min — ABNORMAL LOW (ref >60)
GFR calc non Af Amer: 47 mL/min — ABNORMAL LOW (ref >60)
Glucose, Bld: 76 mg/dL (ref 65–99)
Potassium: 3.3 mmol/L — ABNORMAL LOW (ref 3.5–5.1)
Sodium: 142 mmol/L (ref 135–145)

## 2016-10-09 LAB — GLUCOSE, CAPILLARY
Glucose-Capillary: 89 mg/dL (ref 65–99)
Glucose-Capillary: 89 mg/dL (ref 65–99)
Glucose-Capillary: 99 mg/dL (ref 65–99)

## 2016-10-09 MED ORDER — SODIUM CHLORIDE 0.9 % IV SOLN
510.0000 mg | Freq: Once | INTRAVENOUS | Status: AC
  Administered 2016-10-09: 510 mg via INTRAVENOUS
  Filled 2016-10-09: qty 17

## 2016-10-09 NOTE — Discharge Instructions (Signed)
You were admitted with excess fluid retention because you did not take your Torsemide at home as prescribed. It is important to take your medications as prescribed and use your BiPAP at night in order to keep your breathing under control.  Follow up with your primary doctor on 9/25 at 10:50am.

## 2016-10-09 NOTE — Discharge Summary (Signed)
[facility] Discharge Summary  Patient name: Tiffany [name] Medical record number: [id number] Date of birth: 1960/03/06 Age: 56 y.o. Gender: female Date of Admission: 10/06/2016  Date of Discharge: 10/09/2016 Admitting Physician: Sela Hilding, MD  Primary Care Provider: [name], MD Consultants: None  Indication for Hospitalization: Shortness of breath  Discharge Diagnoses/Problem List:  Acute on Chronic Respiratory failure 2/2 COPD, pulmonary HTN, improved CHF, stable Chest pain, resolved Anemia of chronic disease, stable Diabetes Type 2, stable Schizophrenia, stable History of cocaine abuse, stable Diabetic neuropathy, stable Closed fracture of R tibia, stable  Disposition: Home  Discharge Condition: Improved   Discharge Exam:  General: Obese female lying in bed with [place] in place Cardiovascular: RRR, no murmur, 1+ edema to shins bilaterally Respiratory: NWOB, CTABL, but breath sounds distant 2/2 body habitus, no wheezing or rhonci Gastrointestinal: obese abdomen, SNTND MSK: no gross deformities or edema, RLE very TTP 2/2 fracture  Brief Hospital Course:  Patient presented with sudden onset of respiratory distress secondary to volume overload after not taking her home diuretic for the the past several days. In the ED, troponins negative x3, EKG unchanged from prior. D dimer 0.9, CTA chest no acute process. CXR mild congestion. [name] s/p Lasix 106m IV and solumedrol in the ED. Patient was transitioned to her home Torsemide 223mPO and tolerated it well with good diuresis. Patient received home BiPAP at night and 6L O2 with home COPD meds, scheduled duonebs, PRN albuterol throughout admission and is stable for discharge with HHPT.  Issues for Follow Up:  1. Ensure continued compliance and toleration of home medications and BiPAP. 2. No medication changes this admission.  Significant Procedures: None  Significant Labs and  Imaging:   Recent Labs Lab 10/06/16 2303 10/08/16 1002 10/09/16 0438  WBC 10.5 10.0 9.1  HGB 8.5* 8.0* 8.2*  HCT 28.8* 27.5* 28.3*  PLT 300 309 317    Recent Labs Lab 10/06/16 2303 10/07/16 0431 10/08/16 0349 10/09/16 1218  NA 140 141 140 142  K 4.4 4.0 3.9 3.3*  CL 103 98* 96* 97*  CO2 29 30 35* 36*  GLUCOSE 96 151* 108* 76  BUN '7 8 15 16  ' CREATININE 1.07* 1.23* 1.27* 1.24*  CALCIUM 9.4 9.6 8.9 9.0  MG  --  1.8  --   --    CTA Chest PE W/Wo Contrast 9/22 FINDINGS: Cardiovascular: Contrast bolus is somewhat limited but there is moderately good opacification of the central and proximal segmental pulmonary arteries. No filling defects are demonstrated. No evidence of significant pulmonary embolus. Smaller peripheral emboli are not entirely excluded. Cardiac enlargement. No pericardial effusions. Normal caliber thoracic aorta. Mediastinum/Nodes: Mediastinal lymph nodes are not pathologically enlarged. Esophagus is decompressed Lungs/Pleura: Evaluation of the lungs is limited due to respiratory motion. There appears to be mild mosaic pattern which could indicate edema. Mild atelectasis in the lung bases. Edema pattern is improved since previous study. No pleural effusions. No pneumothorax. Upper Abdomen: No acute process demonstrated in the visualized upper abdomen. Musculoskeletal: Degenerative changes in spine. No destructive bone lesions. Review of the MIP images confirms the above findings.  IMPRESSION: 1. No evidence of significant central pulmonary embolus. 2. Cardiac enlargement with suggestion of pulmonary edema, improved since previous study.   CXR 9/21 FINDINGS: Normal cardiac silhouette. No effusion, infiltrate or pneumothorax. Mild central venous congestion. No acute osseous abnormality.  IMPRESSION: Mild central venous congestion.  No infiltrate.  Results/Tests Pending at Time of Discharge: None  Discharge Medications:  Allergies as of 10/09/2016      Reactions    Hydroxyzine Shortness Of Breath   -throat closed up   Latuda [lurasidone Hcl] Anaphylaxis   Codeine Nausea And Vomiting   Sulfa Antibiotics Itching      Medication List    TAKE these medications   ACCU-CHEK AVIVA PLUS w/Device Kit CHECK BLOOD SUGAR THREE TIMES DAILY   VENTOLIN HFA 108 (90 Base) MCG/ACT inhaler Generic drug:  albuterol Inhale 2 puffs into the lungs every 6 (six) hours as needed for wheezing or shortness of breath.   albuterol (2.5 MG/3ML) 0.083% nebulizer solution Commonly known as:  PROVENTIL Take 3 mLs (2.5 mg total) by nebulization every 6 (six) hours as needed for wheezing or shortness of breath.   amLODipine 5 MG tablet Commonly known as:  NORVASC Take 1 tablet (5 mg total) by mouth daily.   aspirin 81 MG EC tablet Take 1 tablet (81 mg total) by mouth daily. Swallow whole.   atorvastatin 40 MG tablet Commonly known as:  LIPITOR Take 1 tablet (40 mg total) by mouth daily at bedtime   B-D SINGLE USE SWABS REGULAR Pads CHECK CAPILLARY BLOOD GLUCOSE ONE TIME DAILY   divalproex 500 MG 24 hr tablet Commonly known as:  [name] ER Take 3 tablets (1,500 mg total) by mouth at bedtime.   escitalopram 20 MG tablet Commonly known as:  LEXAPRO Take 1 tablet (20 mg total) by mouth daily.   fluticasone furoate-vilanterol 100-25 MCG/INH Aepb Commonly known as:  BREO ELLIPTA INHALE 1 PUFF EVERY DAY   gabapentin 600 MG tablet Commonly known as:  NEURONTIN Take 1 tablet (600 mg total) by mouth 3 (three) times daily.   glucose blood test strip Use to check blood sugar 3 times daily.   metFORMIN 500 MG 24 hr tablet Commonly known as:  GLUCOPHAGE-XR Take 1 tablet (500 mg total) by mouth 2 (two) times daily.   mirtazapine 30 MG disintegrating tablet Commonly known as:  REMERON SOL-TAB Take 1 tablet (30 mg total) by mouth at bedtime.   montelukast 10 MG tablet Commonly known as:  SINGULAIR Take 1 tablet (10 mg total) by mouth at bedtime.   multivitamin  with minerals Tabs tablet Take 1 tablet by mouth daily.   omeprazole 40 MG capsule Commonly known as:  PRILOSEC Take 1 capsule (40 mg total) by mouth 2 (two) times daily.   OXYGEN Inhale 6 L into the lungs daily.   potassium chloride SA 20 MEQ tablet Commonly known as:  K-DUR,KLOR-CON Take 1 tablet (20 mEq total) by mouth 2 (two) times daily.   tiotropium 18 MCG inhalation capsule Commonly known as:  SPIRIVA HANDIHALER Place 1 capsule (18 mcg total) into inhaler and inhale daily.   torsemide 20 MG tablet Commonly known as:  DEMADEX Take 3 tablets (60 mg total) by mouth daily.   ziprasidone 80 MG capsule Commonly known as:  GEODON Take 1 capsule (80 mg total) by mouth at bedtime.            Durable Medical Equipment        Start     Ordered   10/09/16 [phone]  For home use only DME Shower stool  Once     10/09/16 0723       Discharge Care Instructions        Start     Ordered   10/09/16 0000  Increase activity slowly     10/09/16 1402   10/09/16 0000  Diet - low sodium heart healthy     10/09/16 1402      Discharge Instructions: Please refer to Patient Instructions section of EMR for full details.  Patient was counseled important signs and symptoms that should prompt return to medical care, changes in medications, dietary instructions, activity restrictions, and follow up appointments.   Follow-Up Appointments: Follow-up Information    [facility] Follow up.   Why:  home health services arranged, office will call an set up home visits Contact information: [address] [place] [address] [phone]        [name], MD. Go on 10/10/2016.   Specialty:  Family Medicine Why:  Follow up on 9/25 at 10:50am. Contact information: [place] [address] [phone]           Arul Farabee, [place], DO 10/09/2016, 8:28 PM PGY-1, [place]

## 2016-10-09 NOTE — Progress Notes (Signed)
Occupational Therapy Treatment Patient Details Name: Tiffany [name] MRN: [id number] DOB: 1960/04/12 Today's Date: 10/09/2016    History of present illness Tiffany [name] is a 56 y.o. female presenting with SOB starting on day of admission . PMH is significant for end stage asthma/COPD (6L home O2 daytime, BiPAP QHS), CKDII, T2DM, HFpEF, HLD, HTN, Bipolar 1 disorder, paranoid schizophrenia,  Hx of cocaine abuse.    OT comments  Pt is knowledgeable in use of toilet aid. Performed toilet transfer and one grooming task at sink with supervision. Pt educated in availability of AE for LB bathing and dressing, but was not interested.  Follow Up Recommendations  Home health OT;Supervision - Intermittent    Equipment Recommendations  None recommended by OT    Recommendations for Other Services      Precautions / Restrictions Precautions Precautions: Fall Precaution Comments: watch O2 Other Brace/Splint: CAM boot - can wean as tolerated Restrictions Weight Bearing Restrictions: Yes RLE Weight Bearing: Weight bearing as tolerated       Mobility Bed Mobility               General bed mobility comments: pt in chair  Transfers Overall transfer level: Needs assistance Equipment used: Rolling walker (2 wheeled) Transfers: Sit to/from Stand Sit to Stand: Min assist         General transfer comment: verbal cues for hand placement    Balance     Sitting balance-Leahy Scale: Good       Standing balance-Leahy Scale: Poor Standing balance comment: Relies on UE support on RW for ambulation                           ADL either performed or assessed with clinical judgement   ADL Overall ADL's : Needs assistance/impaired     Grooming: Supervision/safety;Wash/dry hands;Standing         Lower Body Bathing Details (indicate cue type and reason): educated pt in availability of AE for LB bathing       Lower Body Dressing Details (indicate cue type and reason):  educated pt in availability of AE for LB dressing Toilet Transfer: Supervision/safety;Ambulation;RW   Toileting- Clothing Manipulation and Hygiene: Minimal assistance;Sit to/from stand Toileting - Clothing Manipulation Details (indicate cue type and reason): pt lost toilet aid between room changes, provided a second and  educated in use, recommended wet wipes      Functional mobility during ADLs: Supervision/safety;Rolling walker General ADL Comments: pt grateful for another toilet aid, not concerned about AE for LB bathing and dressing     Vision       Perception     Praxis      Cognition Arousal/Alertness: Awake/alert Behavior During Therapy: WFL for tasks assessed/performed Overall Cognitive Status: Within Functional Limits for tasks assessed                                          Exercises     Shoulder Instructions       General Comments      Pertinent Vitals/ Pain       Faces Pain Scale: Hurts a little bit Pain Location: right leg Pain Descriptors / Indicators: Discomfort Pain Intervention(s): Monitored during session  Home Living  Prior Functioning/Environment              Frequency  Min 2X/week        Progress Toward Goals  OT Goals(current goals can now be found in the care plan section)  Progress towards OT goals: Progressing toward goals  Acute Rehab OT Goals Patient Stated Goal: get back home OT Goal Formulation: With patient Time For Goal Achievement: 10/22/16 Potential to Achieve Goals: Good  Plan Discharge plan remains appropriate    Co-evaluation                 AM-PAC PT "6 Clicks" Daily Activity     Outcome Measure   Help from another person eating meals?: None Help from another person taking care of personal grooming?: A Little Help from another person toileting, which includes using toliet, bedpan, or urinal?: A Little Help from another  person bathing (including washing, rinsing, drying)?: A Lot Help from another person to put on and taking off regular upper body clothing?: A Little Help from another person to put on and taking off regular lower body clothing?: A Lot 6 Click Score: 17    End of Session Equipment Utilized During Treatment: Oxygen;Gait belt;Rolling walker (CAM boot)  OT Visit Diagnosis: Unsteadiness on feet (R26.81);Other abnormalities of gait and mobility (R26.89);Muscle weakness (generalized) (M62.81);Pain Pain - Right/Left: Right Pain - part of body: Leg   Activity Tolerance Patient tolerated treatment well   Patient Left in chair;with call bell/phone within reach   Nurse Communication Other (comment) (new toilet aid provided)        Time: 1007-1219 OT Time Calculation (min): 14 min  Charges: OT General Charges $OT Visit: 1 Visit OT Treatments $Self Care/Home Management : 8-22 mins  10/09/2016 Nestor Lewandowsky, OTR/L Pager: [phone]   Werner Lean, Haze Boyden 10/09/2016, 2:18 PM

## 2016-10-09 NOTE — Progress Notes (Signed)
Physical Therapy Treatment Patient Details Name: Tiffany [name] MRN: [id number] DOB: [birth year] Today's Date: 10/09/2016    History of Present Illness Tiffany [name] is a 56 y.o. female presenting with SOB starting on day of admission . PMH is significant for end stage asthma/COPD (6L home O2 daytime, BiPAP QHS), CKDII, T2DM, HFpEF, HLD, HTN, Bipolar 1 disorder, paranoid schizophrenia,  Hx of cocaine abuse.     PT Comments    Pt using pursed lipped breathing without cueing through out session. She c/o dizziness on rising into sitting that subsided after ~57min. Min A to ambulate to the restroom and assist required for peri care. Pt would benefit from continued skilled PT to increase functional independence. Will continue to follow acutely.    Follow Up Recommendations  Home health PT;Supervision - Intermittent     Equipment Recommendations  Other (comment) (Shower seat (patient requests))    Recommendations for Other Services       Precautions / Restrictions Precautions Precautions: Fall Precaution Comments: watch O2 Required Braces or Orthoses: Other Brace/Splint Other Brace/Splint: CAM boot - can wean as tolerated Restrictions Weight Bearing Restrictions: Yes RLE Weight Bearing: Weight bearing as tolerated    Mobility  Bed Mobility Overal bed mobility: Modified Independent Bed Mobility: Supine to Sit     Supine to sit: Modified independent (Device/Increase time)     General bed mobility comments: increased time and effort to progress hips to EOB after rising into seated position.  Transfers Overall transfer level: Needs assistance Equipment used: Rolling walker (2 wheeled) Transfers: Sit to/from Stand Sit to Stand: Min assist;+2 safety/equipment         General transfer comment: Verbal cues for hand placement and technique. pt c/o dizziness that subsided within 2 min.  Ambulation/Gait Ambulation/Gait assistance: Min assist;+2 safety/equipment  (lines) Ambulation Distance (Feet): 25 Feet Assistive device: Rolling walker (2 wheeled) Gait Pattern/deviations: Step-through pattern;Decreased step length - right;Decreased step length - left;Decreased stride length;Decreased weight shift to right;Trunk flexed Gait velocity: decreased Gait velocity interpretation: Below normal speed for age/gender General Gait Details: Assist for balance/safety.  Ambulated on 6L O2. Ambulated too and from bathroom. Pt utalizing pursed lipped breathing.   Stairs            Wheelchair Mobility    Modified Rankin (Stroke Patients Only)       Balance Overall balance assessment: Needs assistance Sitting-balance support: No upper extremity supported;Feet supported Sitting balance-Leahy Scale: Good     Standing balance support: Bilateral upper extremity supported Standing balance-Leahy Scale: Poor Standing balance comment: Relies on UE support on RW, and external assist for dynamic balance.                            Cognition Arousal/Alertness: Awake/alert Behavior During Therapy: WFL for tasks assessed/performed Overall Cognitive Status: Within Functional Limits for tasks assessed                                        Exercises      General Comments        Pertinent Vitals/Pain Pain Assessment: 0-10 Faces Pain Scale: Hurts a little bit Pain Location: right leg Pain Descriptors / Indicators: Discomfort Pain Intervention(s): Monitored during session;Limited activity within patient's tolerance    Home Living  Prior Function            PT Goals (current goals can now be found in the care plan section) Acute Rehab PT Goals Patient Stated Goal: get back home PT Goal Formulation: With patient Time For Goal Achievement: 10/15/16 Potential to Achieve Goals: Good Progress towards PT goals: Progressing toward goals    Frequency    Min 3X/week      PT Plan Current  plan remains appropriate    Co-evaluation              AM-PAC PT "6 Clicks" Daily Activity  Outcome Measure  Difficulty turning over in bed (including adjusting bedclothes, sheets and blankets)?: A Little Difficulty moving from lying on back to sitting on the side of the bed? : A Lot Difficulty sitting down on and standing up from a chair with arms (e.g., wheelchair, bedside commode, etc,.)?: A Little Help needed moving to and from a bed to chair (including a wheelchair)?: A Little Help needed walking in hospital room?: A Little Help needed climbing 3-5 steps with a railing? : Total 6 Click Score: 15    End of Session Equipment Utilized During Treatment: Gait belt;Oxygen Activity Tolerance: Patient tolerated treatment well;Patient limited by fatigue Patient left: in chair;with call bell/phone within reach Nurse Communication: Mobility status PT Visit Diagnosis: Unsteadiness on feet (R26.81);Other abnormalities of gait and mobility (R26.89);Muscle weakness (generalized) (M62.81);Pain Pain - Right/Left: Right Pain - part of body: Leg     Time: 8250-0370 PT Time Calculation (min) (ACUTE ONLY): 23 min  Charges:  $Gait Training: 8-22 mins $Therapeutic Activity: 8-22 mins                    G Codes:       Benjiman Core, [place] Pager [phone] Acute Rehab   Allena Katz 10/09/2016, 11:38 AM

## 2016-10-09 NOTE — Progress Notes (Signed)
Pt refused CPAP qhs.  Pt states she is going home and will not need it tonight.

## 2016-10-09 NOTE — Progress Notes (Signed)
Family Medicine Teaching Service Daily Progress Note Intern Pager: [phone]  Patient name: Tiffany [name] Medical record number: [id number] Date of birth: 1960/05/29 Age: 56 y.o. Gender: female  Primary Care Provider: [name], MD Consultants: none Code Status: FULL  Pt Overview and Major Events to Date:  9/22 admitted with likely fluid overload  Assessment and Plan: Tiffany [name] is a 56 y.o. female presenting with SOB starting on day 1 of admission. PMH is significant for end stage asthma/COPD (6L home O2 daytime, BiPAP QHS), CKDII, T2DM, HFpEF, HLD, HTN, Bipolar 1 disorder, paranoid schizophrenia, Hx of cocaine abuse.   Acute on Chronic Respiratory failure 2/2 COPD and pulmonary HTN, improving:  Likely 2/2 fluid overload due to non-compliance with torsemide. -continue O2 (home is 6L [place]) -bipap at night -duonebs q6H sch  -albuterol q2H PRN -continuous pulse ox  CHF: echo 09/08/16 with EF 60-65%, normal wall motion. Fluid overloaded on exam, likely due to med noncompliance. Patient received one dose IV lasix 40mg  at [place] on 9/22, resumed home torsemide 9/23 and continues with good diuresis, net -5L since admission.  -BMP daily -continue home torsemide -strict I/Os, daily weights  Chest pain, resolved: mild chest pain under L breast that feels dull. Low suspicion for ACS given neg trop x 3 and unchanged EKG.  No pain on exam today. - continue to follow  Anemia of chronic disease: Hemoglobin 11 03/2016. Drifting down since that time, last Hgb 8.2. No signs of bleeding on exam or history. - follow up CBC  DMII: last a1c 07/05/16 5.6. Holding home metformin. Has not required any short acting insulin since admission. CBG this am 104. -moderate sliding scale -daily CBGs with BMP  Schizophrenia: stable.home meds include cogentin 0.5mg , depakote 1500mg  QD (increased by psych 9/21 per patient), remeron 45mg  daily, geodon. -continue cogentin, remeron, lexapro,  depakote, geodon  History of cocaine abuse: UDS negative on admission. -monitor as clinically indicated  Diabetic neuropathy: home gabapentin is 600mg  TID.  - cont home gabapentin  Closed fracture of right tibia: Seems to have missed outpatient ortho follow up. Last seen by Dr. Erlinda Hong on 09/20/16 during recent admission stating WBAT in CAM to strengthen RLE.  -PT/OT - recommend HHPT/OT  FEN/GI: carb mod diet PPx: heparin subq given AKI  Disposition: Medically stable for d/c with HHPT/OT with home O2 and BiPAP  Subjective:  Patient feels well and denies any CP or significant SOB.  Objective: Temp:  [98.4 F (36.9 C)-99 F (37.2 C)] 98.4 F (36.9 C) (09/24 0452) Pulse Rate:  [68-170] 68 (09/24 0452) Resp:  [15-20] 20 (09/24 0452) BP: (108-125)/(59-84) 125/61 (09/24 0452) SpO2:  [94 %-100 %] 94 % (09/24 0452) Weight:  [295 lb 13.7 oz (134.2 kg)] 295 lb 13.7 oz (134.2 kg) (09/24 0451)  Physical Exam: General: Obese female lying in bed with  in place Cardiovascular: RRR, no murmur, 1+ edema to shins bilaterally Respiratory: NWOB, CTABL, but breath sounds distant 2/2 body habitus, no wheezing or rhonci Gastrointestinal: obese abdomen, SNTND MSK: no gross deformities or edema, RLE very TTP 2/2 fracture  Laboratory:  Recent Labs Lab 10/06/16 2303 10/08/16 1002 10/09/16 0438  WBC 10.5 10.0 9.1  HGB 8.5* 8.0* 8.2*  HCT 28.8* 27.5* 28.3*  PLT 300 309 317    Recent Labs Lab 10/06/16 2303 10/07/16 0431 10/08/16 0349  NA 140 141 140  K 4.4 4.0 3.9  CL 103 98* 96*  CO2 29 30 35*  BUN 7 8 15   CREATININE 1.07*  1.23* 1.27*  CALCIUM 9.4 9.6 8.9  GLUCOSE 96 151* 108*   Trop neg x 3.   Imaging/Diagnostic Tests:  CTA Chest PE W/Wo Contrast 9/22 FINDINGS: Cardiovascular: Contrast bolus is somewhat limited but there is moderately good opacification of the central and proximal segmental pulmonary arteries. No filling defects are demonstrated. No evidence of significant  pulmonary embolus. Smaller peripheral emboli are not entirely excluded. Cardiac enlargement. No pericardial effusions. Normal caliber thoracic aorta. Mediastinum/Nodes: Mediastinal lymph nodes are not pathologically enlarged. Esophagus is decompressed Lungs/Pleura: Evaluation of the lungs is limited due to respiratory motion. There appears to be mild mosaic pattern which could indicate edema. Mild atelectasis in the lung bases. Edema pattern is improved since previous study. No pleural effusions. No pneumothorax. Upper Abdomen: No acute process demonstrated in the visualized upper abdomen. Musculoskeletal: Degenerative changes in spine. No destructive bone lesions. Review of the MIP images confirms the above findings.  IMPRESSION: 1. No evidence of significant central pulmonary embolus. 2. Cardiac enlargement with suggestion of pulmonary edema, improved since previous study.   CXR 9/21 FINDINGS: Normal cardiac silhouette. No effusion, infiltrate or pneumothorax. Mild central venous congestion. No acute osseous abnormality.  IMPRESSION: Mild central venous congestion.  No infiltrate.   Rory Percy, DO 10/09/2016, 7:13 AM PGY-1, [place] Intern pager: [phone], text pages welcome

## 2016-10-09 NOTE — Care Management Note (Signed)
Case Management Note  Patient Details  Name: Tiffany [name] MRN: [id number] Date of Birth: 1960-07-19  Subjective/Objective:      Admitted with asthma exacerbation, hx of end stage asthma/COPD (6L home O2 daytime, BiPAP QHS), CKDII, T2DM, HFpEF, HLD, HTN, Bipolar 1 disorder, paranoid schizophrenia,  Hx of cocaine abuse. Pt with 6 admits within the past 6 months. Pt states lives with son, [name]. PTA [facility] provided pt with home health services ( RN,PT,OT,NA), and was [facility] by [facility]. Pt's home oxygen is provided by [facility].    [name] Bettes (Son)    [phone]     PCP:  [name]  Action/Plan: Plan is to d/c to home today with home health services to resume, [facility] following as well. Pt will need transportation by PTAR called and arranged @ [phone]) for pt pick up.  Expected Discharge Date:  10/11/16               Expected Discharge Plan:  [place]  In-House Referral:  [facility]  Discharge planning Services  CM Consult  Post Acute Care Choice:  Resumption of Svcs/PTA Provider, Home Health, Durable Medical Equipment Choice offered to:  Patient  DME Arranged:    DME Agency:     HH Arranged:  PT, OT, Nurse's Aide, RN [place] Agency:  [place] , [facility] liaison made aware of pt's d/c plan . Pt active with [facility] PTA.  Status of Service:  Completed, signed off  If discussed at [place] of Stay Meetings, dates discussed:    Additional Comments:  Sharin Mons, RN 10/09/2016, 10:49 AM

## 2016-10-09 NOTE — Progress Notes (Signed)
Patient discharge teaching given, including activity, diet, follow-up appoints, and medications. Patient verbalized understanding of all discharge instructions. IV access was d/c'd. Vitals are stable. Skin is intact except as charted in most recent assessments. Pt to be escorted out by PTAR, to be driven home.

## 2016-10-09 NOTE — Patient Outreach (Signed)
[facility] Care Management  10/09/2016  Tiffany [name] 1960-05-19 [id number]   Noted that member was readmitted to hospital over the weekend with complications of heart failure vs COPD.  Hospital liaisons notified, request made to address palliative care as inpatient as member was introduced to this topic in the home by home health.  Will follow up upon discharge.  [name], [place], MSN [place] [phone]

## 2016-10-09 NOTE — Progress Notes (Signed)
[facility]  Patient Status: Active (receiving services up to time of hospitalization)  [facility] is providing the following services: RN, PT, OT and HHA  If patient discharges after hours, please call [phone].   Tiffany [name] 10/09/2016, 10:27 AM

## 2016-10-09 NOTE — Consult Note (Signed)
Received notification from inpatient RNCM, Levada Dy, of the patient's potential discharge today.  Patient is currently active with [facility] [place] [facility] for chronic disease management services.  Patient has been engaged by a [employer] and [name].  Our community based plan of care has focused on disease management and community resource support.  Patient will receive a post discharge transition of care call and will be evaluated for monthly home visits for assessments and disease process education.  Made Inpatient Case Manager aware that [place] [facility] following. Of note, [facility] Care Management services does not replace or interfere with any services that are needed or arranged by inpatient case management or social work.  For additional questions or referrals please contact:  Natividad Brood, RN BSN [place] [facility] Liaison  [phone] business mobile phone Toll free office [phone]

## 2016-10-09 NOTE — Consult Note (Addendum)
   [facility] Inpatient Consult   10/09/2016  Tiffany [name] [birth year] [id number]    [facility] Care Management follow up to confirm outpatient palliative services.  Spoke with Butch Penny, liaison with [place] to collaborate and discover which palliative care program was Tiffany [name] referred to as an outpatient.  Butch Penny Advertising copywriter that outpatient palliative services thru [facility] was arranged.  Telephone call made to [facility] outpatient palliative services program. Spoke with Verdis Frederickson who confirms that Tiffany [name] is indeed active with palliative program thru [place].   Verdis Frederickson states the outpatient palliative NP had a home visit with Tiffany [name] just last week.  Discussed this with [place] at bedside.   Notified [facility] Community RNCM of above.  Notified inpatient RNCM of above.  Appreciative of all of the collaboration efforts.    Marthenia Rolling, MSN-Ed, RN,BSN [facility] Liaison [phone]

## 2016-10-09 NOTE — Patient Outreach (Signed)
[place] [facility]) Care Management  10/09/2016  Tiffany [name] Jun 07, 1960 [id number]   [name] received message from [place] that patient was admitted to the hospital 9/21 for acute on chronic CHF. Patient is confirmed to be active with [facility] [place]. [name] will close case with patient once patient is discharged from hospital & it has been confirmed that hospice has made a home visit.    Raynaldo Opitz, LCSW [facility]  Clinical Social Worker cell #: [phone]

## 2016-10-09 NOTE — Progress Notes (Signed)
Patient arrived to the unit via bed from [facility].  Patient is alert an oriented x 4.  No complaints of pain.  Skin assessment complete. IV intact to the right antecubital.  Placed the patient on cardiac monitoring.  Vital signs:  Temp 98.7; BP: 108/59;Pulse 80; Resp 19 O2 sat 99% on 6LPM. Educated the patient on how to reach the staff on the unit.  Lowered the bed, placed the call light within reach and activated the bed alarm.  Will continue to monitor the patient.

## 2016-10-09 NOTE — Consult Note (Signed)
   [facility] CM Inpatient Consult   10/09/2016  Tiffany [name] [birth year] [id number]   Received notification from inpatient RNCM, Levada Dy, of the patient's potential discharge today.  Patient is currently active with [place] [facility] for chronic disease management services.  Patient has been engaged by a [employer], Software engineer,  and [name].  Our community based plan of care has focused on disease management and community resource support.  Met with the patient at the bedside to discuss her program in [facility] and needs.  Patient states that she feels she has a lot of people that's seeing her and she's not sure which social worker is helping her from a couple of agencies.  Patient states, "I really like working with [facility], cause ya'll be with me the longest."  Expressed to the patient that follow up with [place] will likely continue because [facility] does not do home care and does not have therapy."  Patient expressed understanding.  Ongoing follow up needed regarding palliative care that was referred prior to admission.  Community RN Care Coordinator messaged that the patient had been recommended for palliative care and follow up needed.  Will follow up with inpatient RNCM.  Of note, [facility] Care Management services does not replace or interfere with any services that are needed or arranged by inpatient case management or social work.  For additional questions or referrals please contact:  Natividad Brood, RN BSN [facility] Liaison  [phone] business mobile phone Toll free office [phone]

## 2016-10-10 ENCOUNTER — Other Ambulatory Visit: Payer: Self-pay | Admitting: Pharmacist

## 2016-10-10 ENCOUNTER — Encounter: Payer: Self-pay | Admitting: Internal Medicine

## 2016-10-10 ENCOUNTER — Ambulatory Visit (INDEPENDENT_AMBULATORY_CARE_PROVIDER_SITE_OTHER): Payer: Medicare HMO | Admitting: Internal Medicine

## 2016-10-10 ENCOUNTER — Encounter: Payer: Self-pay | Admitting: *Deleted

## 2016-10-10 ENCOUNTER — Other Ambulatory Visit: Payer: Self-pay | Admitting: *Deleted

## 2016-10-10 VITALS — BP 132/80 | HR 82 | Temp 98.6°F | Wt 297.2 lb

## 2016-10-10 DIAGNOSIS — S82221S Displaced transverse fracture of shaft of right tibia, sequela: Secondary | ICD-10-CM | POA: Diagnosis not present

## 2016-10-10 DIAGNOSIS — E118 Type 2 diabetes mellitus with unspecified complications: Secondary | ICD-10-CM

## 2016-10-10 DIAGNOSIS — J189 Pneumonia, unspecified organism: Secondary | ICD-10-CM | POA: Diagnosis not present

## 2016-10-10 DIAGNOSIS — G4733 Obstructive sleep apnea (adult) (pediatric): Secondary | ICD-10-CM | POA: Diagnosis not present

## 2016-10-10 DIAGNOSIS — R0602 Shortness of breath: Secondary | ICD-10-CM | POA: Diagnosis not present

## 2016-10-10 DIAGNOSIS — J42 Unspecified chronic bronchitis: Secondary | ICD-10-CM

## 2016-10-10 DIAGNOSIS — I5032 Chronic diastolic (congestive) heart failure: Secondary | ICD-10-CM | POA: Diagnosis not present

## 2016-10-10 DIAGNOSIS — R062 Wheezing: Secondary | ICD-10-CM | POA: Diagnosis not present

## 2016-10-10 DIAGNOSIS — J449 Chronic obstructive pulmonary disease, unspecified: Secondary | ICD-10-CM | POA: Diagnosis not present

## 2016-10-10 DIAGNOSIS — J45909 Unspecified asthma, uncomplicated: Secondary | ICD-10-CM | POA: Diagnosis not present

## 2016-10-10 DIAGNOSIS — Z9181 History of falling: Secondary | ICD-10-CM | POA: Diagnosis not present

## 2016-10-10 DIAGNOSIS — J440 Chronic obstructive pulmonary disease with acute lower respiratory infection: Secondary | ICD-10-CM | POA: Diagnosis not present

## 2016-10-10 DIAGNOSIS — J9620 Acute and chronic respiratory failure, unspecified whether with hypoxia or hypercapnia: Secondary | ICD-10-CM | POA: Diagnosis not present

## 2016-10-10 DIAGNOSIS — M6281 Muscle weakness (generalized): Secondary | ICD-10-CM | POA: Diagnosis not present

## 2016-10-10 DIAGNOSIS — I5033 Acute on chronic diastolic (congestive) heart failure: Secondary | ICD-10-CM | POA: Diagnosis not present

## 2016-10-10 DIAGNOSIS — S82831S Other fracture of upper and lower end of right fibula, sequela: Secondary | ICD-10-CM | POA: Diagnosis not present

## 2016-10-10 DIAGNOSIS — I509 Heart failure, unspecified: Secondary | ICD-10-CM | POA: Diagnosis not present

## 2016-10-10 NOTE — Patient Outreach (Signed)
[place] [facility]) Care Management  10/10/2016  Tiffany [name] Aug 17, 1960 [id number]   Noted that member discharged home from hospital yesterday.  Call placed to member to discuss involvement with [facility] [place].  [name] advised that hospital liaison (Lonn Georgia) verified with that member is active.  Member made aware that due to involvement with their program, [facility] will close case.  [name] hesitant due to losing services with [facility].  [name] advised that Palliative Care will be able to offer resources just as [facility].  Also notified noting her decline in health, palliative care services will be able to provide necessary care.  She remains receptive to palliative care services, stating "ok, I'll go ahead with them."    She continues to have home health for nursing and PT.  Will notify primary MD of case closure.  [name], [place], MSN [place] [phone]

## 2016-10-10 NOTE — Progress Notes (Signed)
   [name] [name], MD Phone: [phone]  Reason For Visit: Hospital follow up   # COPD/CHF - Recently hospitalized for CHF exacerbation, had evaluation for ACS and PE both of which were negative; signs of infection on CT chest - No significant cough, congestion, sputum production  - Patient is suppose to take 3 tablets daily of torsemide, has been taking 60 mg of toresmide twice daily  - Breathing is doing well, no SOB since getting out of hospital yesterday  - Has been using her CPAP machine at night - Has been using the [place] - has not followed pulmonologist - plans to follow-up with them  # CHRONIC DM, Type 2: Reports  no concerns Meds: Metforminb 500 mg twice daily  Reports  good compliance. Tolerating well w/o side-effects Currently on ACEi / ARB  Denies polyuria, visual changes,  Endorses  numbness and tingling however takes gabapentin for this issue   Past Medical History Reviewed problem list.  Medications- reviewed and updated No additions to family history Social history- patient is a previous smoker  Objective: BP 132/80 (BP Location: Right Wrist, Patient Position: Sitting, Cuff Size: Normal)   Pulse 82   Temp 98.6 F (37 C) (Oral)   Wt 297 lb 3.2 oz (134.8 kg)   SpO2 98%   BMI 51.01 kg/m  Gen: NAD, alert, cooperative with exam Neck: No signs of JVD Cardio: regular rate and rhythm, S1S2 heard, no murmurs appreciated Pulm: clear to auscultation bilaterally, no wheezes, rhonchi or rales Extremities: warm, well perfused,  1-2+ edema in left leg, can not evaluate right due to  MSK: Normal gait and station Skin: dry, intact, no rashes or lesions Neuro: Strength and sensation grossly intact  Assessment/Plan: See problem based a/p  Chronic diastolic heart failure (HCC) She was 2+ pitting edema, no JVD. Patient will take torsemide 60 mg twice daily;  instead of once daily. - Continue Torsemide 60 mg BID  - Follow  up in 1 week to re-evaluate fluid status     COPD (chronic obstructive pulmonary disease) (HCC) No wheezing/SOB. No concerns of physical exam within normal limits. No acute exacerbation.  - Breo and Spiriva  - Albuterol as needed  - Patient to follow up with pulmonology   Type 2 diabetes mellitus ([place]) A1C well controlled;  - metformin 500 mg twice daily  - Will obtain foot exam - Eye exam needs to be obtained - Urine microalbumin

## 2016-10-10 NOTE — Patient Instructions (Signed)
I want you to follow-up with me in 1 week. When I recheck your fluid. He can continue the torsemide as you've been taking it. Please make sure to take every day

## 2016-10-11 ENCOUNTER — Encounter (HOSPITAL_BASED_OUTPATIENT_CLINIC_OR_DEPARTMENT_OTHER): Payer: Medicare HMO

## 2016-10-11 ENCOUNTER — Ambulatory Visit: Payer: Self-pay | Admitting: Pharmacist

## 2016-10-11 ENCOUNTER — Other Ambulatory Visit: Payer: Self-pay | Admitting: *Deleted

## 2016-10-11 ENCOUNTER — Ambulatory Visit: Payer: Self-pay | Admitting: *Deleted

## 2016-10-11 NOTE — Patient Outreach (Addendum)
  Late entry for 10/10/16  [facility]) Care Management  10/11/2016  Tiffany [name] 05-08-1960 [id number]   Patient called to let me know she was hospitalized and may need to reschedule our home visit. HIPAA identifiers were obtained. Since speaking with the patient, it has been determined the patient will be followed by hospice.   Since the patient will be followed by hospice, her case will be closed so services will not be duplicated.  [facility] has already closed.  Elayne Guerin, PharmD, [place] Clinical Pharmacist [phone]

## 2016-10-11 NOTE — Patient Outreach (Signed)
Telephone call made to [facility] with [facility] Palliative program at [phone] ext [phone] to confirm that Ms. Ficken discharged from the hospital on 10/09/16 evening.  Maria expressed appreciation of call.  [facility] Palliative program to follow up with Ms. Diss for ongoing palliative care services.   Marthenia Rolling, MSN-Ed, RN,BSN [facility] Liaison [phone]

## 2016-10-12 ENCOUNTER — Ambulatory Visit (INDEPENDENT_AMBULATORY_CARE_PROVIDER_SITE_OTHER): Payer: Medicare HMO

## 2016-10-12 ENCOUNTER — Ambulatory Visit (INDEPENDENT_AMBULATORY_CARE_PROVIDER_SITE_OTHER): Payer: Medicare HMO | Admitting: Orthopaedic Surgery

## 2016-10-12 DIAGNOSIS — S82221D Displaced transverse fracture of shaft of right tibia, subsequent encounter for closed fracture with routine healing: Secondary | ICD-10-CM | POA: Diagnosis not present

## 2016-10-12 MED ORDER — HYDROCODONE-ACETAMINOPHEN 5-325 MG PO TABS: 1.0000 | ORAL_TABLET | Freq: Every day | ORAL | 0 refills | Status: DC | PRN

## 2016-10-12 NOTE — Assessment & Plan Note (Addendum)
She was 2+ pitting edema, no JVD. Patient will take torsemide 60 mg twice daily;  instead of once daily. - Continue Torsemide 60 mg BID  - Follow up in 1 week to re-evaluate fluid status

## 2016-10-12 NOTE — Progress Notes (Signed)
Patient is 6-7 weeks status post intramedullary nailing of a right tibia fracture. [name] doing well overall. Just has some occasional moderate pain.  Physical exam is benign. Fully healed surgical scars. X-ray show abundant callus formation with stable fixation.  At this point may begin to weight-bear as tolerated in a [place] with home health physical therapy. Follow-up in 6 weeks with 2 view x-rays of right tib-fib.

## 2016-10-12 NOTE — Assessment & Plan Note (Addendum)
No wheezing/SOB. No concerns of physical exam within normal limits. No acute exacerbation.  - Breo and Spiriva  - Albuterol as needed  - Patient to follow up with pulmonology

## 2016-10-12 NOTE — Assessment & Plan Note (Signed)
A1C well controlled;  - metformin 500 mg twice daily  - Will obtain foot exam - Eye exam needs to be obtained - Urine microalbumin

## 2016-10-13 ENCOUNTER — Other Ambulatory Visit: Payer: Self-pay | Admitting: Internal Medicine

## 2016-10-13 DIAGNOSIS — F200 Paranoid schizophrenia: Secondary | ICD-10-CM

## 2016-10-13 DIAGNOSIS — I5033 Acute on chronic diastolic (congestive) heart failure: Secondary | ICD-10-CM

## 2016-10-13 MED ORDER — TORSEMIDE 20 MG PO TABS: 60.0000 mg | ORAL_TABLET | Freq: Every day | ORAL | 0 refills | Status: DC

## 2016-10-13 NOTE — Telephone Encounter (Signed)
Pt is calling for a torsemide to [employer] .

## 2016-10-14 ENCOUNTER — Other Ambulatory Visit (HOSPITAL_COMMUNITY): Payer: Self-pay | Admitting: Psychiatry

## 2016-10-14 DIAGNOSIS — F200 Paranoid schizophrenia: Secondary | ICD-10-CM

## 2016-10-15 DIAGNOSIS — I130 Hypertensive heart and chronic kidney disease with heart failure and stage 1 through stage 4 chronic kidney disease, or unspecified chronic kidney disease: Secondary | ICD-10-CM | POA: Diagnosis not present

## 2016-10-15 DIAGNOSIS — I272 Pulmonary hypertension, unspecified: Secondary | ICD-10-CM | POA: Diagnosis not present

## 2016-10-15 DIAGNOSIS — E1140 Type 2 diabetes mellitus with diabetic neuropathy, unspecified: Secondary | ICD-10-CM | POA: Diagnosis not present

## 2016-10-15 DIAGNOSIS — S82201D Unspecified fracture of shaft of right tibia, subsequent encounter for closed fracture with routine healing: Secondary | ICD-10-CM | POA: Diagnosis not present

## 2016-10-15 DIAGNOSIS — N182 Chronic kidney disease, stage 2 (mild): Secondary | ICD-10-CM | POA: Diagnosis not present

## 2016-10-15 DIAGNOSIS — D631 Anemia in chronic kidney disease: Secondary | ICD-10-CM | POA: Diagnosis not present

## 2016-10-15 DIAGNOSIS — J441 Chronic obstructive pulmonary disease with (acute) exacerbation: Secondary | ICD-10-CM | POA: Diagnosis not present

## 2016-10-15 DIAGNOSIS — I5030 Unspecified diastolic (congestive) heart failure: Secondary | ICD-10-CM | POA: Diagnosis not present

## 2016-10-15 DIAGNOSIS — E1122 Type 2 diabetes mellitus with diabetic chronic kidney disease: Secondary | ICD-10-CM | POA: Diagnosis not present

## 2016-10-16 ENCOUNTER — Telehealth (INDEPENDENT_AMBULATORY_CARE_PROVIDER_SITE_OTHER): Payer: Self-pay | Admitting: Radiology

## 2016-10-16 ENCOUNTER — Other Ambulatory Visit: Payer: Self-pay | Admitting: *Deleted

## 2016-10-16 DIAGNOSIS — J441 Chronic obstructive pulmonary disease with (acute) exacerbation: Secondary | ICD-10-CM | POA: Diagnosis not present

## 2016-10-16 DIAGNOSIS — S82201D Unspecified fracture of shaft of right tibia, subsequent encounter for closed fracture with routine healing: Secondary | ICD-10-CM | POA: Diagnosis not present

## 2016-10-16 DIAGNOSIS — I5030 Unspecified diastolic (congestive) heart failure: Secondary | ICD-10-CM | POA: Diagnosis not present

## 2016-10-16 DIAGNOSIS — N182 Chronic kidney disease, stage 2 (mild): Secondary | ICD-10-CM | POA: Diagnosis not present

## 2016-10-16 DIAGNOSIS — I130 Hypertensive heart and chronic kidney disease with heart failure and stage 1 through stage 4 chronic kidney disease, or unspecified chronic kidney disease: Secondary | ICD-10-CM | POA: Diagnosis not present

## 2016-10-16 DIAGNOSIS — E1140 Type 2 diabetes mellitus with diabetic neuropathy, unspecified: Secondary | ICD-10-CM | POA: Diagnosis not present

## 2016-10-16 DIAGNOSIS — D631 Anemia in chronic kidney disease: Secondary | ICD-10-CM | POA: Diagnosis not present

## 2016-10-16 DIAGNOSIS — E1122 Type 2 diabetes mellitus with diabetic chronic kidney disease: Secondary | ICD-10-CM | POA: Diagnosis not present

## 2016-10-16 DIAGNOSIS — I272 Pulmonary hypertension, unspecified: Secondary | ICD-10-CM | POA: Diagnosis not present

## 2016-10-16 MED ORDER — FLUTICASONE FUROATE-VILANTEROL 100-25 MCG/INH IN AEPB: INHALATION_SPRAY | RESPIRATORY_TRACT | 0 refills | Status: DC

## 2016-10-16 MED ORDER — ALBUTEROL SULFATE (2.5 MG/3ML) 0.083% IN NEBU: 2.5000 mg | INHALATION_SOLUTION | Freq: Four times a day (QID) | RESPIRATORY_TRACT | 1 refills | Status: DC | PRN

## 2016-10-16 MED ORDER — ATORVASTATIN CALCIUM 40 MG PO TABS: ORAL_TABLET | ORAL | 3 refills | Status: DC

## 2016-10-16 NOTE — Telephone Encounter (Signed)
Tiffany [name] wants to have HHPT orders 2 x week for 4 weeks, then 1 x week for 4 weeks.  For gait, strengthening, and transfer training. Please call him to advise if ok.

## 2016-10-16 NOTE — Telephone Encounter (Signed)
Is this okay?

## 2016-10-16 NOTE — Telephone Encounter (Signed)
yes

## 2016-10-17 ENCOUNTER — Encounter: Payer: Self-pay | Admitting: Adult Health

## 2016-10-17 ENCOUNTER — Ambulatory Visit (INDEPENDENT_AMBULATORY_CARE_PROVIDER_SITE_OTHER): Payer: Medicare HMO | Admitting: Adult Health

## 2016-10-17 ENCOUNTER — Emergency Department (HOSPITAL_COMMUNITY): Payer: Medicare HMO

## 2016-10-17 ENCOUNTER — Emergency Department (HOSPITAL_COMMUNITY)
Admission: EM | Admit: 2016-10-17 | Discharge: 2016-10-17 | Disposition: A | Discharge status: Home / Self Care | Payer: Medicare HMO | Source: Home / Self Care

## 2016-10-17 ENCOUNTER — Other Ambulatory Visit: Payer: Self-pay

## 2016-10-17 DIAGNOSIS — I5032 Chronic diastolic (congestive) heart failure: Secondary | ICD-10-CM | POA: Diagnosis present

## 2016-10-17 DIAGNOSIS — Z9981 Dependence on supplemental oxygen: Secondary | ICD-10-CM | POA: Diagnosis not present

## 2016-10-17 DIAGNOSIS — I5033 Acute on chronic diastolic (congestive) heart failure: Secondary | ICD-10-CM | POA: Diagnosis not present

## 2016-10-17 DIAGNOSIS — S82201D Unspecified fracture of shaft of right tibia, subsequent encounter for closed fracture with routine healing: Secondary | ICD-10-CM | POA: Diagnosis not present

## 2016-10-17 DIAGNOSIS — N183 Chronic kidney disease, stage 3 (moderate): Secondary | ICD-10-CM | POA: Diagnosis present

## 2016-10-17 DIAGNOSIS — R0600 Dyspnea, unspecified: Secondary | ICD-10-CM | POA: Diagnosis not present

## 2016-10-17 DIAGNOSIS — D649 Anemia, unspecified: Secondary | ICD-10-CM | POA: Diagnosis present

## 2016-10-17 DIAGNOSIS — I130 Hypertensive heart and chronic kidney disease with heart failure and stage 1 through stage 4 chronic kidney disease, or unspecified chronic kidney disease: Secondary | ICD-10-CM | POA: Diagnosis present

## 2016-10-17 DIAGNOSIS — Z7951 Long term (current) use of inhaled steroids: Secondary | ICD-10-CM | POA: Diagnosis not present

## 2016-10-17 DIAGNOSIS — J42 Unspecified chronic bronchitis: Secondary | ICD-10-CM

## 2016-10-17 DIAGNOSIS — Z8674 Personal history of sudden cardiac arrest: Secondary | ICD-10-CM | POA: Diagnosis not present

## 2016-10-17 DIAGNOSIS — Z5321 Procedure and treatment not carried out due to patient leaving prior to being seen by health care provider: Secondary | ICD-10-CM | POA: Insufficient documentation

## 2016-10-17 DIAGNOSIS — J441 Chronic obstructive pulmonary disease with (acute) exacerbation: Secondary | ICD-10-CM | POA: Diagnosis not present

## 2016-10-17 DIAGNOSIS — R0603 Acute respiratory distress: Secondary | ICD-10-CM | POA: Diagnosis not present

## 2016-10-17 DIAGNOSIS — E785 Hyperlipidemia, unspecified: Secondary | ICD-10-CM | POA: Diagnosis present

## 2016-10-17 DIAGNOSIS — I5030 Unspecified diastolic (congestive) heart failure: Secondary | ICD-10-CM | POA: Diagnosis not present

## 2016-10-17 DIAGNOSIS — G4733 Obstructive sleep apnea (adult) (pediatric): Secondary | ICD-10-CM | POA: Diagnosis not present

## 2016-10-17 DIAGNOSIS — I252 Old myocardial infarction: Secondary | ICD-10-CM | POA: Diagnosis not present

## 2016-10-17 DIAGNOSIS — Z7982 Long term (current) use of aspirin: Secondary | ICD-10-CM | POA: Diagnosis not present

## 2016-10-17 DIAGNOSIS — Z87891 Personal history of nicotine dependence: Secondary | ICD-10-CM | POA: Diagnosis not present

## 2016-10-17 DIAGNOSIS — E1140 Type 2 diabetes mellitus with diabetic neuropathy, unspecified: Secondary | ICD-10-CM | POA: Diagnosis present

## 2016-10-17 DIAGNOSIS — N182 Chronic kidney disease, stage 2 (mild): Secondary | ICD-10-CM | POA: Diagnosis not present

## 2016-10-17 DIAGNOSIS — R404 Transient alteration of awareness: Secondary | ICD-10-CM | POA: Diagnosis not present

## 2016-10-17 DIAGNOSIS — Z79899 Other long term (current) drug therapy: Secondary | ICD-10-CM | POA: Diagnosis not present

## 2016-10-17 DIAGNOSIS — K219 Gastro-esophageal reflux disease without esophagitis: Secondary | ICD-10-CM | POA: Diagnosis present

## 2016-10-17 DIAGNOSIS — J180 Bronchopneumonia, unspecified organism: Secondary | ICD-10-CM | POA: Diagnosis not present

## 2016-10-17 DIAGNOSIS — R0602 Shortness of breath: Secondary | ICD-10-CM | POA: Insufficient documentation

## 2016-10-17 DIAGNOSIS — Z7984 Long term (current) use of oral hypoglycemic drugs: Secondary | ICD-10-CM | POA: Diagnosis not present

## 2016-10-17 DIAGNOSIS — J9621 Acute and chronic respiratory failure with hypoxia: Secondary | ICD-10-CM | POA: Diagnosis present

## 2016-10-17 DIAGNOSIS — Z8673 Personal history of transient ischemic attack (TIA), and cerebral infarction without residual deficits: Secondary | ICD-10-CM | POA: Diagnosis not present

## 2016-10-17 DIAGNOSIS — D631 Anemia in chronic kidney disease: Secondary | ICD-10-CM | POA: Diagnosis not present

## 2016-10-17 DIAGNOSIS — I5022 Chronic systolic (congestive) heart failure: Secondary | ICD-10-CM | POA: Diagnosis not present

## 2016-10-17 DIAGNOSIS — J189 Pneumonia, unspecified organism: Secondary | ICD-10-CM | POA: Diagnosis not present

## 2016-10-17 DIAGNOSIS — E669 Obesity, unspecified: Secondary | ICD-10-CM | POA: Diagnosis present

## 2016-10-17 DIAGNOSIS — I272 Pulmonary hypertension, unspecified: Secondary | ICD-10-CM | POA: Diagnosis not present

## 2016-10-17 DIAGNOSIS — E1122 Type 2 diabetes mellitus with diabetic chronic kidney disease: Secondary | ICD-10-CM | POA: Diagnosis present

## 2016-10-17 DIAGNOSIS — R531 Weakness: Secondary | ICD-10-CM | POA: Diagnosis not present

## 2016-10-17 DIAGNOSIS — J440 Chronic obstructive pulmonary disease with acute lower respiratory infection: Secondary | ICD-10-CM | POA: Diagnosis not present

## 2016-10-17 DIAGNOSIS — F200 Paranoid schizophrenia: Secondary | ICD-10-CM | POA: Diagnosis present

## 2016-10-17 DIAGNOSIS — Z6841 Body Mass Index (BMI) 40.0 and over, adult: Secondary | ICD-10-CM | POA: Diagnosis not present

## 2016-10-17 LAB — CBC
HCT: 32.5 % — ABNORMAL LOW (ref 36.0–46.0)
Hemoglobin: 9.5 g/dL — ABNORMAL LOW (ref 12.0–15.0)
MCH: 28.4 pg (ref 26.0–34.0)
MCHC: 29.2 g/dL — ABNORMAL LOW (ref 30.0–36.0)
MCV: 97 fL (ref 78.0–100.0)
Platelets: 551 10*3/uL — ABNORMAL HIGH (ref 150–400)
RBC: 3.35 MIL/uL — ABNORMAL LOW (ref 3.87–5.11)
RDW: 16.9 % — ABNORMAL HIGH (ref 11.5–15.5)
WBC: 9.9 10*3/uL (ref 4.0–10.5)

## 2016-10-17 LAB — I-STAT TROPONIN, ED: Troponin i, poc: 0.01 ng/mL (ref 0.00–0.08)

## 2016-10-17 LAB — BASIC METABOLIC PANEL
Anion gap: 9 (ref 5–15)
BUN: 15 mg/dL (ref 6–20)
CO2: 28 mmol/L (ref 22–32)
Calcium: 9.6 mg/dL (ref 8.9–10.3)
Chloride: 101 mmol/L (ref 101–111)
Creatinine, Ser: 1.33 mg/dL — ABNORMAL HIGH (ref 0.44–1.00)
GFR calc Af Amer: 50 mL/min — ABNORMAL LOW (ref >60)
GFR calc non Af Amer: 43 mL/min — ABNORMAL LOW (ref >60)
Glucose, Bld: 115 mg/dL — ABNORMAL HIGH (ref 65–99)
Potassium: 3.8 mmol/L (ref 3.5–5.1)
Sodium: 138 mmol/L (ref 135–145)

## 2016-10-17 NOTE — ED Notes (Signed)
Pt called for treatment room x 1, no answer.

## 2016-10-17 NOTE — Patient Instructions (Addendum)
Continue on BREO 1 puff daily .  Continue on Spiriva daily .  Continue on Oxygen 6l/m .  Continue on Trilogy Vent At bedtime  .  Follow up with Dr. Lamonte Sakai  In 6 weeks and As needed   Please contact office for sooner follow up if symptoms do not improve or worsen or seek emergency care

## 2016-10-17 NOTE — Telephone Encounter (Signed)
Called Merry Proud to advise

## 2016-10-17 NOTE — Assessment & Plan Note (Signed)
Recurrent exacerbation - now resolving   Plan  Patient Instructions  Continue on BREO 1 puff daily .  Continue on Spiriva daily .  Continue on Oxygen 6l/m .  Continue on Trilogy Vent At bedtime  .  Follow up with Dr. Lamonte Sakai  In 6 weeks and As needed   Please contact office for sooner follow up if symptoms do not improve or worsen or seek emergency care

## 2016-10-17 NOTE — Progress Notes (Signed)
'@Patient'  ID: Tiffany [name], female    DOB: 1960-06-02, 56 y.o.   MRN: [id number]  Chief Complaint  Patient presents with  . Follow-up    COPD    Referring provider: [name], MD  HPI: 56 year old former smoker quit March 2018 followed for COPD, asthma , OSA/OHS and chronic hypoxic respiratory failure on oxygen Patient has history of schizophrenia, polysubstance abuse with cocaine/THC   TEST  04/2016 FEV1 53%.  Echo 08/2016 EF nml , Gr 1 DD , PAP nml .    10/17/2016 Follow up : COPD /O2 RF /OSA/OHS  Patient presents for a follow up office visit for COPD , O2 RF.  Last seen in office 04/2016 .  Eating snacks in office during exam. Pt and Family on phone during visit.   Patient has had several hospitalizations over the last several months. She was admitted last 1 week ago for acute on chronic respiratory failure with decompensated diastolic heart failure. She had not been using her diuretic prior to admission for several days. Patient was treated with IV Lasix. In early September. Patient was admitted with proggressive shortness of breath. In the emergency room she suffered a PEA arrest. She required intubation and vent support.  She was weaned to BIPAP . Prior to discharge she was changed to Trilogy Vent .  Says she is doing better but remains weak and gets sob easily . [name] on O2 at 6l/m .  [name] now on Trilogy Vent At bedtime . Denies flare of cough or wheezing .  She remains on BREO and Spiriva daily.   Declines flu shot . PVX utd.   Allergies  Allergen Reactions  . Hydroxyzine Shortness Of Breath    -throat closed up  . Latuda [Lurasidone Hcl] Anaphylaxis  . Codeine Nausea And Vomiting  . Sulfa Antibiotics Itching    Immunization History  Administered Date(s) Administered  . Influenza,inj,Quad PF,6+ Mos 09/19/2013  . Pneumococcal Polysaccharide-23 09/19/2013  . Tdap 09/22/2015    Past Medical History:  Diagnosis Date  . Anxiety   . Arthritis    "all over" (04/10/2016)  . Asthma   . Cardiac arrest ([place]) 09/08/2016   PEA  . CHF (congestive heart failure) ([place])   . Chronic bronchitis ([place])   . Chronic kidney disease    "I see a kidney dr." (04/10/2016)  . Cocaine abuse ([place])   . Complication of anesthesia    decreased bp, decreased heart rate  . Depression   . Disorder of nervous system   . Emphysema   . GERD (gastroesophageal reflux disease)   . Heart attack ([place]) 1980s  . History of blood transfusion 1994   "couldn't stop bleeding from my period"  . Hyperlipidemia LDL goal <70   . Hypertension   . Incontinence   . Manic depression ([place])   . On home oxygen therapy    "6L; 24/7" (04/10/2016)  . OSA on CPAP    "wear mask sometimes" (04/10/2016)  . Paranoid ([place])    "sometimes; I'm on RX for it" (04/10/2016)  . Pneumonia    "I've had it several times; haven't had it since 06/2015" (04/10/2016)  . Schizophrenia ([place])   . Seasonal allergies   . Seizures ([place])    "don't know what kind; last one was ~ 1 yr ago" (04/10/2016)  . Sinus trouble   . Stroke [facility]) 1980s   denies residual on 04/10/2016  . Type II diabetes mellitus (HCC)     Tobacco  History: History  Smoking Status  . Former Smoker  . Packs/day: 1.50  . Years: 38.00  . Types: Cigarettes  . Start date: 03/13/1977  . Quit date: 04/10/2016  Smokeless Tobacco  . Never Used   Counseling given: Not Answered   Outpatient Encounter Prescriptions as of 10/17/2016  Medication Sig  . albuterol (PROVENTIL) (2.5 MG/3ML) 0.083% nebulizer solution Take 3 mLs (2.5 mg total) by nebulization every 6 (six) hours as needed for wheezing or shortness of breath.  . Alcohol Swabs (B-D SINGLE USE SWABS REGULAR) PADS CHECK CAPILLARY BLOOD GLUCOSE ONE TIME DAILY  . amLODipine (NORVASC) 5 MG tablet Take 1 tablet (5 mg total) by mouth daily.  [name] aspirin 81 MG EC tablet Take 1 tablet (81 mg total) by mouth daily. Swallow whole.  . Blood Glucose Monitoring Suppl (ACCU-CHEK AVIVA PLUS)  w/Device KIT CHECK BLOOD SUGAR THREE TIMES DAILY  . divalproex ([name] ER) 500 MG 24 hr tablet Take 3 tablets (1,500 mg total) by mouth at bedtime. (Patient taking differently: 2,529m (5 tabs) at bedtime)  . escitalopram (LEXAPRO) 20 MG tablet Take 1 tablet (20 mg total) by mouth daily.  . fluticasone furoate-vilanterol (BREO ELLIPTA) 100-25 MCG/INH AEPB INHALE 1 PUFF EVERY DAY  . gabapentin (NEURONTIN) 600 MG tablet Take 1 tablet (600 mg total) by mouth 3 (three) times daily.  .Marland Kitchenglucose blood test strip Use to check blood sugar 3 times daily.  .Marland KitchenHYDROcodone-acetaminophen (NORCO) 5-325 MG tablet Take 1-2 tablets by mouth daily as needed.  . metFORMIN (GLUCOPHAGE-XR) 500 MG 24 hr tablet Take 1 tablet (500 mg total) by mouth 2 (two) times daily.  . mirtazapine (REMERON SOL-TAB) 30 MG disintegrating tablet Take 1 tablet (30 mg total) by mouth at bedtime.  . montelukast (SINGULAIR) 10 MG tablet Take 1 tablet (10 mg total) by mouth at bedtime.  . Multiple Vitamin (MULTIVITAMIN WITH MINERALS) TABS tablet Take 1 tablet by mouth daily.  .Marland Kitchenomeprazole (PRILOSEC) 40 MG capsule Take 1 capsule (40 mg total) by mouth 2 (two) times daily.  . OXYGEN Inhale 6 L into the lungs daily.  . potassium chloride SA (K-DUR,KLOR-CON) 20 MEQ tablet Take 1 tablet (20 mEq total) by mouth 2 (two) times daily. (Patient taking differently: Take 30 mEq by mouth 2 (two) times daily. )  . tiotropium (SPIRIVA HANDIHALER) 18 MCG inhalation capsule Place 1 capsule (18 mcg total) into inhaler and inhale daily.  .Marland Kitchentorsemide (DEMADEX) 20 MG tablet Take 3 tablets (60 mg total) by mouth daily. (Patient taking differently: Take 60 mg by mouth 2 (two) times daily. )  . VENTOLIN HFA 108 (90 Base) MCG/ACT inhaler Inhale 2 puffs into the lungs every 6 (six) hours as needed for wheezing or shortness of breath.   . ziprasidone (GEODON) 80 MG capsule Take 1 capsule (80 mg total) by mouth at bedtime.  .Marland Kitchenatorvastatin (LIPITOR) 40 MG tablet Take 1  tablet (40 mg total) by mouth daily at bedtime (Patient not taking: Reported on 10/17/2016)   No facility-administered encounter medications on file as of 10/17/2016.      Review of Systems  Constitutional:   No  weight loss, night sweats,  Fevers, chills, + fatigue, or  lassitude.  HEENT:   No headaches,  Difficulty swallowing,  Tooth/dental problems, or  Sore throat,                No sneezing, itching, ear ache, nasal congestion, post nasal drip,   CV:  No chest pain,  Orthopnea,  PND, swelling in lower extremities, anasarca, dizziness, palpitations, syncope.   GI  No heartburn, indigestion, abdominal pain, nausea, vomiting, diarrhea, change in bowel habits, loss of appetite, bloody stools.   Resp  No chest wall deformity  Skin: no rash or lesions.  GU: no dysuria, change in color of urine, no urgency or frequency.  No flank pain, no hematuria   MS:  No joint pain or swelling.  No decreased range of motion.  No back pain.    Physical Exam  BP 114/66 (BP Location: Left Arm, Cuff Size: Large)   Pulse 92   SpO2 96%   GEN: A/Ox3; pleasant , NAD , obese on O2    HEENT:  [place]/AT,  EACs-clear, TMs-wnl, NOSE-clear, THROAT-clear, no lesions, no postnasal drip or exudate noted. Class 3 MP airway   NECK:  Supple w/ fair ROM; no JVD; normal carotid impulses w/o bruits; no thyromegaly or nodules palpated; no lymphadenopathy.    RESP  Decreased BS in bases ,  no accessory muscle use, no dullness to percussion  CARD:  RRR, no m/r/g, tr peripheral edema, pulses intact, no cyanosis or clubbing.  GI:   Soft & nt; nml bowel sounds; no organomegaly or masses detected.   Musco: Warm bil, no deformities or joint swelling noted. Right boot .   Neuro: alert, no focal deficits noted.    Skin: Warm, no lesions or rashes    Lab Results:  CBC  Imaging:   Assessment & Plan:   COPD (chronic obstructive pulmonary disease) (HCC) Recurrent exacerbation - now resolving   Plan  Patient  Instructions  Continue on BREO 1 puff daily .  Continue on Spiriva daily .  Continue on Oxygen 6l/m .  Continue on Trilogy Vent At bedtime  .  Follow up with Dr. Lamonte Sakai  In 6 weeks and As needed   Please contact office for sooner follow up if symptoms do not improve or worsen or seek emergency care      Obstructive sleep apnea OSA and OHS now on Trilogy At bedtime   Cont on current setting  DME contact for current settings.      Rexene Edison, NP 10/17/2016

## 2016-10-17 NOTE — Assessment & Plan Note (Signed)
OSA and OHS now on Trilogy At bedtime   Cont on current setting  DME contact for current settings.

## 2016-10-17 NOTE — ED Triage Notes (Signed)
Pt reports she thinks her CHF is getting bad again. Pt reports 10lb weight gain over the last week. SOB with exertion, non productive cough. Pt wearing 6L [place] because per pt her lung doctor told her this is what she should wear. Sats 100% on 6L, lungs CTA. No acute distress noted. VSS.

## 2016-10-18 ENCOUNTER — Ambulatory Visit (INDEPENDENT_AMBULATORY_CARE_PROVIDER_SITE_OTHER): Payer: Medicare HMO | Admitting: Internal Medicine

## 2016-10-18 ENCOUNTER — Ambulatory Visit: Payer: Medicare HMO | Admitting: Cardiology

## 2016-10-18 ENCOUNTER — Emergency Department (HOSPITAL_COMMUNITY): Admission: EM | Admit: 2016-10-18 | Payer: Medicare HMO | Source: Home / Self Care

## 2016-10-18 ENCOUNTER — Encounter: Payer: Self-pay | Admitting: Internal Medicine

## 2016-10-18 ENCOUNTER — Encounter (HOSPITAL_COMMUNITY): Payer: Self-pay | Admitting: Emergency Medicine

## 2016-10-18 ENCOUNTER — Inpatient Hospital Stay (HOSPITAL_COMMUNITY)
Admission: AD | Admit: 2016-10-18 | Discharge: 2016-10-20 | DRG: 193 | Disposition: A | Discharge status: Home Health | Payer: Medicare HMO | Source: Ambulatory Visit | Attending: Family Medicine | Admitting: Family Medicine

## 2016-10-18 ENCOUNTER — Inpatient Hospital Stay (HOSPITAL_COMMUNITY): Payer: Medicare HMO

## 2016-10-18 VITALS — BP 120/70 | HR 86 | Temp 98.6°F

## 2016-10-18 DIAGNOSIS — Z6841 Body Mass Index (BMI) 40.0 and over, adult: Secondary | ICD-10-CM | POA: Diagnosis not present

## 2016-10-18 DIAGNOSIS — Z7951 Long term (current) use of inhaled steroids: Secondary | ICD-10-CM

## 2016-10-18 DIAGNOSIS — K219 Gastro-esophageal reflux disease without esophagitis: Secondary | ICD-10-CM | POA: Diagnosis present

## 2016-10-18 DIAGNOSIS — Z8673 Personal history of transient ischemic attack (TIA), and cerebral infarction without residual deficits: Secondary | ICD-10-CM

## 2016-10-18 DIAGNOSIS — Z7982 Long term (current) use of aspirin: Secondary | ICD-10-CM

## 2016-10-18 DIAGNOSIS — E785 Hyperlipidemia, unspecified: Secondary | ICD-10-CM | POA: Diagnosis present

## 2016-10-18 DIAGNOSIS — J180 Bronchopneumonia, unspecified organism: Secondary | ICD-10-CM | POA: Diagnosis not present

## 2016-10-18 DIAGNOSIS — J441 Chronic obstructive pulmonary disease with (acute) exacerbation: Secondary | ICD-10-CM | POA: Diagnosis not present

## 2016-10-18 DIAGNOSIS — E1140 Type 2 diabetes mellitus with diabetic neuropathy, unspecified: Secondary | ICD-10-CM | POA: Diagnosis present

## 2016-10-18 DIAGNOSIS — I5033 Acute on chronic diastolic (congestive) heart failure: Secondary | ICD-10-CM | POA: Diagnosis not present

## 2016-10-18 DIAGNOSIS — J9621 Acute and chronic respiratory failure with hypoxia: Secondary | ICD-10-CM | POA: Diagnosis present

## 2016-10-18 DIAGNOSIS — Z9981 Dependence on supplemental oxygen: Secondary | ICD-10-CM | POA: Diagnosis not present

## 2016-10-18 DIAGNOSIS — S82201D Unspecified fracture of shaft of right tibia, subsequent encounter for closed fracture with routine healing: Secondary | ICD-10-CM | POA: Diagnosis not present

## 2016-10-18 DIAGNOSIS — R0603 Acute respiratory distress: Secondary | ICD-10-CM | POA: Diagnosis not present

## 2016-10-18 DIAGNOSIS — Z87891 Personal history of nicotine dependence: Secondary | ICD-10-CM

## 2016-10-18 DIAGNOSIS — N183 Chronic kidney disease, stage 3 (moderate): Secondary | ICD-10-CM | POA: Diagnosis present

## 2016-10-18 DIAGNOSIS — Z8674 Personal history of sudden cardiac arrest: Secondary | ICD-10-CM

## 2016-10-18 DIAGNOSIS — I130 Hypertensive heart and chronic kidney disease with heart failure and stage 1 through stage 4 chronic kidney disease, or unspecified chronic kidney disease: Secondary | ICD-10-CM | POA: Diagnosis present

## 2016-10-18 DIAGNOSIS — Z79899 Other long term (current) drug therapy: Secondary | ICD-10-CM | POA: Diagnosis not present

## 2016-10-18 DIAGNOSIS — I5032 Chronic diastolic (congestive) heart failure: Secondary | ICD-10-CM | POA: Diagnosis present

## 2016-10-18 DIAGNOSIS — D649 Anemia, unspecified: Secondary | ICD-10-CM | POA: Diagnosis present

## 2016-10-18 DIAGNOSIS — Z7984 Long term (current) use of oral hypoglycemic drugs: Secondary | ICD-10-CM | POA: Diagnosis not present

## 2016-10-18 DIAGNOSIS — J440 Chronic obstructive pulmonary disease with acute lower respiratory infection: Secondary | ICD-10-CM | POA: Diagnosis present

## 2016-10-18 DIAGNOSIS — R0602 Shortness of breath: Secondary | ICD-10-CM | POA: Diagnosis present

## 2016-10-18 DIAGNOSIS — E669 Obesity, unspecified: Secondary | ICD-10-CM | POA: Diagnosis present

## 2016-10-18 DIAGNOSIS — I252 Old myocardial infarction: Secondary | ICD-10-CM | POA: Diagnosis not present

## 2016-10-18 DIAGNOSIS — J189 Pneumonia, unspecified organism: Secondary | ICD-10-CM | POA: Diagnosis present

## 2016-10-18 DIAGNOSIS — I5022 Chronic systolic (congestive) heart failure: Secondary | ICD-10-CM | POA: Diagnosis not present

## 2016-10-18 DIAGNOSIS — F200 Paranoid schizophrenia: Secondary | ICD-10-CM | POA: Diagnosis present

## 2016-10-18 DIAGNOSIS — J42 Unspecified chronic bronchitis: Secondary | ICD-10-CM

## 2016-10-18 DIAGNOSIS — R0600 Dyspnea, unspecified: Secondary | ICD-10-CM

## 2016-10-18 DIAGNOSIS — E1122 Type 2 diabetes mellitus with diabetic chronic kidney disease: Secondary | ICD-10-CM | POA: Diagnosis present

## 2016-10-18 LAB — BASIC METABOLIC PANEL
Anion gap: 13 (ref 5–15)
BUN: 17 mg/dL (ref 6–20)
CO2: 31 mmol/L (ref 22–32)
Calcium: 9.4 mg/dL (ref 8.9–10.3)
Chloride: 98 mmol/L — ABNORMAL LOW (ref 101–111)
Creatinine, Ser: 1.38 mg/dL — ABNORMAL HIGH (ref 0.44–1.00)
GFR calc Af Amer: 48 mL/min — ABNORMAL LOW (ref >60)
GFR calc non Af Amer: 42 mL/min — ABNORMAL LOW (ref >60)
Glucose, Bld: 98 mg/dL (ref 65–99)
Potassium: 3.5 mmol/L (ref 3.5–5.1)
Sodium: 142 mmol/L (ref 135–145)

## 2016-10-18 LAB — CBC
HCT: 31.5 % — ABNORMAL LOW (ref 36.0–46.0)
Hemoglobin: 9.1 g/dL — ABNORMAL LOW (ref 12.0–15.0)
MCH: 28 pg (ref 26.0–34.0)
MCHC: 28.9 g/dL — ABNORMAL LOW (ref 30.0–36.0)
MCV: 96.9 fL (ref 78.0–100.0)
Platelets: 461 10*3/uL — ABNORMAL HIGH (ref 150–400)
RBC: 3.25 MIL/uL — ABNORMAL LOW (ref 3.87–5.11)
RDW: 16.6 % — ABNORMAL HIGH (ref 11.5–15.5)
WBC: 9.8 10*3/uL (ref 4.0–10.5)

## 2016-10-18 LAB — BRAIN NATRIURETIC PEPTIDE: B Natriuretic Peptide: 10.2 pg/mL (ref 0.0–100.0)

## 2016-10-18 MED ORDER — IPRATROPIUM-ALBUTEROL 0.5-2.5 (3) MG/3ML IN SOLN: 3.0000 mL | Freq: Four times a day (QID) | RESPIRATORY_TRACT | Status: DC | PRN

## 2016-10-18 MED ORDER — ATORVASTATIN CALCIUM 40 MG PO TABS
40.0000 mg | ORAL_TABLET | Freq: Every day | ORAL | Status: DC
  Administered 2016-10-19 – 2016-10-20 (×2): 40 mg via ORAL
  Filled 2016-10-18 (×2): qty 1

## 2016-10-18 MED ORDER — MIRTAZAPINE 30 MG PO TBDP
30.0000 mg | ORAL_TABLET | Freq: Every day | ORAL | Status: DC
  Administered 2016-10-19 (×2): 30 mg via ORAL
  Filled 2016-10-18 (×3): qty 1

## 2016-10-18 MED ORDER — POTASSIUM CHLORIDE CRYS ER 20 MEQ PO TBCR
20.0000 meq | EXTENDED_RELEASE_TABLET | Freq: Two times a day (BID) | ORAL | Status: DC
Start: 2016-10-19 — End: 2016-10-20
  Administered 2016-10-19 – 2016-10-20 (×4): 20 meq via ORAL
  Filled 2016-10-18 (×4): qty 1

## 2016-10-18 MED ORDER — ZIPRASIDONE HCL 80 MG PO CAPS
80.0000 mg | ORAL_CAPSULE | Freq: Every day | ORAL | Status: DC
  Administered 2016-10-19 (×2): 80 mg via ORAL
  Filled 2016-10-18: qty 4
  Filled 2016-10-18 (×2): qty 1
  Filled 2016-10-18: qty 4
  Filled 2016-10-18: qty 1

## 2016-10-18 MED ORDER — MONTELUKAST SODIUM 10 MG PO TABS
10.0000 mg | ORAL_TABLET | Freq: Every day | ORAL | Status: DC
  Administered 2016-10-19 (×2): 10 mg via ORAL
  Filled 2016-10-18 (×2): qty 1

## 2016-10-18 MED ORDER — METFORMIN HCL ER 500 MG PO TB24
500.0000 mg | ORAL_TABLET | Freq: Two times a day (BID) | ORAL | Status: DC
  Administered 2016-10-19 – 2016-10-20 (×4): 500 mg via ORAL
  Filled 2016-10-18 (×4): qty 1

## 2016-10-18 MED ORDER — TORSEMIDE 20 MG PO TABS: 80.0000 mg | ORAL_TABLET | Freq: Every day | ORAL | 0 refills | Status: DC

## 2016-10-18 MED ORDER — POLYETHYLENE GLYCOL 3350 17 G PO PACK: 17.0000 g | PACK | Freq: Every day | ORAL | Status: DC | PRN

## 2016-10-18 MED ORDER — ALBUTEROL SULFATE (2.5 MG/3ML) 0.083% IN NEBU: 2.5000 mg | INHALATION_SOLUTION | RESPIRATORY_TRACT | Status: DC | PRN

## 2016-10-18 MED ORDER — DEXTROSE 5 % IV SOLN
2.0000 g | Freq: Two times a day (BID) | INTRAVENOUS | Status: DC
  Administered 2016-10-19 – 2016-10-20 (×4): 2 g via INTRAVENOUS
  Filled 2016-10-18 (×6): qty 2

## 2016-10-18 MED ORDER — DIVALPROEX SODIUM ER 500 MG PO TB24
1500.0000 mg | ORAL_TABLET | Freq: Every day | ORAL | Status: DC
  Administered 2016-10-19 (×2): 1500 mg via ORAL
  Filled 2016-10-18 (×3): qty 3

## 2016-10-18 MED ORDER — AMLODIPINE BESYLATE 5 MG PO TABS
5.0000 mg | ORAL_TABLET | Freq: Every day | ORAL | Status: DC
  Administered 2016-10-19 – 2016-10-20 (×2): 5 mg via ORAL
  Filled 2016-10-18 (×2): qty 1

## 2016-10-18 MED ORDER — ENOXAPARIN SODIUM 40 MG/0.4ML ~~LOC~~ SOLN
40.0000 mg | SUBCUTANEOUS | Status: DC
  Administered 2016-10-18 – 2016-10-19 (×2): 40 mg via SUBCUTANEOUS
  Filled 2016-10-18 (×2): qty 0.4

## 2016-10-18 MED ORDER — ACETAMINOPHEN 650 MG RE SUPP: 650.0000 mg | Freq: Four times a day (QID) | RECTAL | Status: DC | PRN

## 2016-10-18 MED ORDER — ASPIRIN EC 81 MG PO TBEC
81.0000 mg | DELAYED_RELEASE_TABLET | Freq: Every day | ORAL | Status: DC
  Administered 2016-10-19 – 2016-10-20 (×2): 81 mg via ORAL
  Filled 2016-10-18 (×2): qty 1

## 2016-10-18 MED ORDER — TIOTROPIUM BROMIDE MONOHYDRATE 18 MCG IN CAPS
18.0000 ug | ORAL_CAPSULE | Freq: Every day | RESPIRATORY_TRACT | Status: DC
  Administered 2016-10-19 – 2016-10-20 (×2): 18 ug via RESPIRATORY_TRACT
  Filled 2016-10-18: qty 5

## 2016-10-18 MED ORDER — TORSEMIDE 20 MG PO TABS
80.0000 mg | ORAL_TABLET | Freq: Every day | ORAL | Status: DC
  Administered 2016-10-19: 80 mg via ORAL
  Filled 2016-10-18: qty 4

## 2016-10-18 MED ORDER — FLUTICASONE FUROATE-VILANTEROL 100-25 MCG/INH IN AEPB
1.0000 | INHALATION_SPRAY | Freq: Every day | RESPIRATORY_TRACT | Status: DC
  Administered 2016-10-19 – 2016-10-20 (×2): 1 via RESPIRATORY_TRACT
  Filled 2016-10-18: qty 28

## 2016-10-18 MED ORDER — GABAPENTIN 600 MG PO TABS
600.0000 mg | ORAL_TABLET | Freq: Three times a day (TID) | ORAL | Status: DC
  Administered 2016-10-19 – 2016-10-20 (×6): 600 mg via ORAL
  Filled 2016-10-18 (×6): qty 1

## 2016-10-18 MED ORDER — ESCITALOPRAM OXALATE 20 MG PO TABS
20.0000 mg | ORAL_TABLET | Freq: Every day | ORAL | Status: DC
  Administered 2016-10-19 – 2016-10-20 (×2): 20 mg via ORAL
  Filled 2016-10-18: qty 1
  Filled 2016-10-18 (×2): qty 2
  Filled 2016-10-18: qty 1

## 2016-10-18 MED ORDER — PANTOPRAZOLE SODIUM 40 MG PO TBEC
40.0000 mg | DELAYED_RELEASE_TABLET | Freq: Every day | ORAL | Status: DC
  Administered 2016-10-19 – 2016-10-20 (×2): 40 mg via ORAL
  Filled 2016-10-18 (×2): qty 1

## 2016-10-18 MED ORDER — ACETAMINOPHEN 325 MG PO TABS
650.0000 mg | ORAL_TABLET | Freq: Four times a day (QID) | ORAL | Status: DC | PRN
  Administered 2016-10-19: 650 mg via ORAL
  Filled 2016-10-18: qty 2

## 2016-10-18 NOTE — H&P (Signed)
[facility] Admission History and Physical Service Pager: [phone]  Patient name: Tiffany [name] Medical record number: [id number] Date of birth: [birth year] Age: 56 y.o. Gender: female  Primary Care Provider: [name], MD Consultants: none Code Status: full  Chief Complaint: shortness of breath, increased edema  Assessment and Plan: Tiffany [name] is a 56 y.o. female presenting with SOB and increased swelling starting 10/2. PMH is significant for asthma/COPD (6L home O2 daytime, "ventilator" QHS), CKDII, T2DM, HFpEF, HLD, HTN, Bipolar 1 disorder, paranoid schizophrenia,  Hx of cocaine abuse.   Acute on chronic respiratory failure likely d/t PNA. Patient presented with SOB with cough and has crackles on L lower lung base c/w CXR in ED triage yesterday that was suggestive of early PNA. Afebrile and normal WBC so does not meet sepsis criteria. Unlikely to be CHF exacerbation as BNP 10.2, appears euvolemic, and is at weight of 289lbs which is lower than her last hospital discharge weight of 297lbs. Will treat as hospital acquired pneumonia due to several recent admissions. Will use cefepime to cover for likely causal bacteria. DDX also includes asthma and copd but no wheezing on exam and patient has been compliant with her home inhalers and no sputum production.  - admit to inpatient family medicine, appropriate for stepdown, Dr. Gwendlyn Deutscher - vitals signs per floor routine - cefepime 2g q 12 hours - am cbc, bmp - continuous pulse ox - home duonebs q 6 hours prn for wheezing - home albuterol 2.74m q 2 hours prn wheening - continue cpap at night - continue home breo, spiriva, singular,   Diastolic CHF not in acute exacerbation Patient with echo on 09/08/2016 with EF 60-65% with normal wall motion. Patient is not fluid overloaded on exam and has bnp of 10.2. Will monitor I/Os but it is unlikely chf is playing role is patient's presentation. - bmp in am -  strict I/Os - daily weights - continue home torsemide  T2DM, controlled. Last a1c 07/05/2016 5.6. On metformin 5015mBID.  - continue home metformin - blood glucose with bmp daily  HTN, stable - continue home norvasc  HLD, stable - continue home statin   GERD - continue home PPI  Schizophrenia: stable.home meds include  depakote 150074mD, remeron 64m55mily, geodon. -continue remeron, lexapro, depakote, geodon  Diabetic neuropathy: home gabapentin is 600mg79m.  -continue home gabapentin   Closed fracture of right tibia: Last seen by Dr. Xu onErlinda Hong/27/18 in office WBAT on cam walker w/ [facility] pt. Follow up in 6 weeks for tib fib xrays -PT/OT - xrays as outpatient  FEN/GI: heart healthy, carb modified diet Prophylaxis: lovenox 40mg 78m  Disposition: likely home  History of Present Illness:  Tiffany MALEEA HENDRY56 y.o22female presenting with difficulty breathing over the last 1 days. She describes her difficulty breathing as tachypnea and "needing to work harder to breath". She was using her ventilator at that time and found her O2 sats to be 74%. Patient felt like she had increased fluid around her abdomen and felt like her abdomen was "tight". She felt like "things were going the wrong way" so she went to the ed overnight . She received one dose of albuterol in triage and received a chest xray which was read as a possible early LLL bronchopneumonia. She was satting in the 90s after the albuterol treatment. She left prior to getting treated in the ed as she did not want to wait a long time  to be seen. [facility] instead [facility]. Due to increased work of breathing she was sent as a direct admission to the hospital. Patient has not missed any doses of torsemide 3m and feels like this has not been working as well. She says that whenever has a chf exacerbation she can usually tell it is coming as her "torsemide stops working". She has been sleeping at her  normal sleeping angle at night. Has been having difficulty walking this week due to shortness of breath.   Review Of Systems: Per HPI with the following additions:  Review of Systems  Constitutional: Negative for chills and fever.  HENT: Negative for congestion and sore throat.   Respiratory: Positive for cough, shortness of breath and wheezing.   Cardiovascular: Negative for chest pain, palpitations and PND.  Gastrointestinal: Negative for abdominal pain, diarrhea, nausea and vomiting.  Genitourinary: Negative for dysuria, frequency and urgency.    Patient Active Problem List   Diagnosis Date Noted  . Shortness of breath 10/18/2016  . Asthma exacerbation 10/07/2016  . Dyspnea 10/07/2016  . Acute on chronic congestive heart failure ([place]   . Acute left ankle pain   . Acute on chronic systolic congestive heart failure (HJeff[place]   . Cardiac arrest (HW[place] 09/08/2016  . History of open reduction and internal fixation (ORIF) procedure   . Closed displaced transverse fracture of shaft of right tibia   . Fall   . Overactive bladder 06/07/2016  . QT prolongation   . OSA and COPD overlap syndrome (H[place]   . Generalized weakness   . Arthritis   . Bipolar I disorder (H[place]   . Acute heart failure (H[place]ill[place]   . Essential hypertension 03/22/2016  . Bipolar affective disorder, mixed, severe, with psychotic behavior (HHorners[place] 11/28/2015  . Cocaine use disorder, severe, dependence (HRaisin City 11/28/2015  . AKI (acute kidney injury) (HS[place]inel [place] 11/24/2015  . Chronic diastolic heart failure (H[place]   . Chronic respiratory failure with hypoxia and hypercapnia (HFel[place] Mills 06/22/2015  . Acute respiratory failure with hypoxia and hypercapnia (HDe[place] Park 06/22/2015  . Onychomycosis 01/21/2015  . Tobacco use disorder 07/22/2014  . COPD (chronic obstructive pulmonary disease) (HMount [place] 07/08/2014  . Knee pain, bilateral 01/22/2013  . Seizure (HSc[place] 01/04/2013  . Health care maintenance 11/25/2012  . History of kidney stones 06/18/2012  .  Chronic pain syndrome 06/18/2012  . Allergic reaction 04/07/2012  . HSV infection 08/30/2011  . Dyslipidemia 04/24/2011  . Anemia 04/24/2011  . Diabetic neuropathy (HS[place] 04/24/2011  . Obstructive sleep apnea 10/18/2010  . Asthma 10/18/2010  . Morbid obesity (H[place] 10/18/2010  . Type 2 diabetes mellitus (HPine[place] 10/18/2010    Past Medical History: Past Medical History:  Diagnosis Date  . Anxiety   . Arthritis    "all over" (04/10/2016)  . Asthma   . Cardiac arrest (HPal[place] 09/08/2016   PEA  . CHF (congestive heart failure) (HSouth [place]   . Chronic bronchitis (HMil[place]   . Chronic kidney disease    "I see a kidney dr." (04/10/2016)  . Cocaine abuse (HF[place] Mitchell   . Complication of anesthesia    decreased bp, decreased heart rate  . Depression   . Disorder of nervous system   . Emphysema   . GERD (gastroesophageal reflux disease)   . Heart attack (HLander 1980s  . History of blood transfusion 1994   "couldn't stop bleeding from my period"  . Hyperlipidemia LDL goal <70   . Hypertension   . Incontinence   . Manic  depression ([place])   . On home oxygen therapy    "6L; 24/7" (04/10/2016)  . OSA on CPAP    "wear mask sometimes" (04/10/2016)  . Paranoid ([place])    "sometimes; I'm on RX for it" (04/10/2016)  . Pneumonia    "I've had it several times; haven't had it since 06/2015" (04/10/2016)  . Schizophrenia ([place])   . Seasonal allergies   . Seizures ([place])    "don't know what kind; last one was ~ 1 yr ago" (04/10/2016)  . Sinus trouble   . Stroke [facility]) 1980s   denies residual on 04/10/2016  . Type II diabetes mellitus ([place])     Past Surgical History: Past Surgical History:  Procedure Laterality Date  . CESAREAN SECTION  1997  . HERNIA REPAIR    . IR CHOLANGIOGRAM EXISTING TUBE  07/20/2016  . IR PERC CHOLECYSTOSTOMY  05/10/2016  . IR RADIOLOGIST EVAL & MGMT  06/08/2016  . IR SINUS/FIST TUBE CHK-NON GI  07/12/2016  . TIBIA IM NAIL INSERTION Right 07/12/2016   Procedure: INTRAMEDULLARY (IM) NAIL RIGHT TIBIA;   Surgeon: Leandrew Koyanagi, MD;  Location: [facility] [facility];  Service: Orthopedics;  Laterality: Right;  . UMBILICAL HERNIA REPAIR  ~ 1963   "that's why I don't have a belly button"  . VAGINAL HYSTERECTOMY      Social History: Social History  Substance Use Topics  . Smoking status: Former Smoker    Packs/day: 1.50    Years: 38.00    Types: Cigarettes    Start date: 03/13/1977    Quit date: 04/10/2016  . Smokeless tobacco: Never Used  . Alcohol use No    Family History: Family History  Problem Relation Age of Onset  . Cancer Father        prostate  . Cancer Mother        lung  . Depression Mother   . Depression Sister   . Anxiety disorder Sister   . Schizophrenia Sister   . Bipolar disorder Sister   . Depression Sister   . Depression Brother   . Heart failure Unknown        cousin    Allergies and Medications: Allergies  Allergen Reactions  . Hydroxyzine Anaphylaxis and Shortness Of Breath  . Latuda [Lurasidone Hcl] Anaphylaxis  . Codeine Nausea And Vomiting  . Sulfa Antibiotics Itching   No current facility-administered medications on file prior to encounter.    Current Outpatient Prescriptions on File Prior to Encounter  Medication Sig Dispense Refill  . albuterol (PROVENTIL) (2.5 MG/3ML) 0.083% nebulizer solution Take 3 mLs (2.5 mg total) by nebulization every 6 (six) hours as needed for wheezing or shortness of breath. 150 mL 1  . Alcohol Swabs (B-D SINGLE USE SWABS REGULAR) PADS CHECK CAPILLARY BLOOD GLUCOSE ONE TIME DAILY 100 each 3  . amLODipine (NORVASC) 5 MG tablet Take 1 tablet (5 mg total) by mouth daily. 90 tablet 0  . aspirin 81 MG EC tablet Take 1 tablet (81 mg total) by mouth daily. Swallow whole. 90 tablet 0  . atorvastatin (LIPITOR) 40 MG tablet Take 1 tablet (40 mg total) by mouth daily at bedtime (Patient not taking: Reported on 10/17/2016) 60 tablet 3  . Blood Glucose Monitoring Suppl (ACCU-CHEK AVIVA PLUS) w/Device KIT CHECK BLOOD SUGAR THREE TIMES DAILY 1 kit  0  . divalproex ([name] ER) 500 MG 24 hr tablet Take 3 tablets (1,500 mg total) by mouth at bedtime. (Patient taking differently: 2,51m (5 tabs) at bedtime) 90 tablet  1  . escitalopram (LEXAPRO) 20 MG tablet Take 1 tablet (20 mg total) by mouth daily. 30 tablet 1  . fluticasone furoate-vilanterol (BREO ELLIPTA) 100-25 MCG/INH AEPB INHALE 1 PUFF EVERY DAY 180 each 0  . gabapentin (NEURONTIN) 600 MG tablet Take 1 tablet (600 mg total) by mouth 3 (three) times daily. 270 tablet 0  . glucose blood test strip Use to check blood sugar 3 times daily. 100 each 12  . HYDROcodone-acetaminophen (NORCO) 5-325 MG tablet Take 1-2 tablets by mouth daily as needed. 30 tablet 0  . metFORMIN (GLUCOPHAGE-XR) 500 MG 24 hr tablet Take 1 tablet (500 mg total) by mouth 2 (two) times daily. 180 tablet 0  . mirtazapine (REMERON SOL-TAB) 30 MG disintegrating tablet Take 1 tablet (30 mg total) by mouth at bedtime. 30 tablet 1  . montelukast (SINGULAIR) 10 MG tablet Take 1 tablet (10 mg total) by mouth at bedtime. 90 tablet 1  . Multiple Vitamin (MULTIVITAMIN WITH MINERALS) TABS tablet Take 1 tablet by mouth daily.    [name] omeprazole (PRILOSEC) 40 MG capsule Take 1 capsule (40 mg total) by mouth 2 (two) times daily. 180 capsule 0  . OXYGEN Inhale 6 L into the lungs daily.    . potassium chloride SA (K-DUR,KLOR-CON) 20 MEQ tablet Take 1 tablet (20 mEq total) by mouth 2 (two) times daily. (Patient taking differently: Take 30 mEq by mouth 2 (two) times daily. ) 150 tablet 2  . tiotropium (SPIRIVA HANDIHALER) 18 MCG inhalation capsule Place 1 capsule (18 mcg total) into inhaler and inhale daily. 90 capsule 1  . torsemide (DEMADEX) 20 MG tablet Take 4 tablets (80 mg total) by mouth daily. 120 tablet 0  . VENTOLIN HFA 108 (90 Base) MCG/ACT inhaler Inhale 2 puffs into the lungs every 6 (six) hours as needed for wheezing or shortness of breath.     . ziprasidone (GEODON) 80 MG capsule Take 1 capsule (80 mg total) by mouth at bedtime.  30 capsule 1    Objective: BP 131/72 (BP Location: Right Arm)   Pulse 74   Temp 98.7 F (37.1 C) (Oral)   Resp 20   Ht '5\' 4"'  (1.626 m)   Wt 289 lb 11 oz (131.4 kg)   SpO2 100%   BMI 49.72 kg/m  Exam: General: obese, resting comfortably on 6 L o2 in bed,  Eyes: EOMI, PERRL, no conjunctival injections ENTM: no signs of symptoms of trauma bilateral ears, nose, head Neck: no cervical lymphadenopathy, no difficulty with rotation of neck Cardiovascular: regular rate and rhythm,  Respiratory: slightly coarse breath sounds Left lung, no chest pain, no tachypnea, no increased work of breathing, on 6L [place] Gastrointestinal: soft, non-tender, non-distended. Bowel sounds present MSK: able to move all four extremities, trace pitting edema bilterally Derm: warm, dry, well perfused Neuro: 5/5 strength BLE, BUE.  Psych: appropriate  Labs and Imaging: CBC BMET   Recent Labs Lab 10/18/16 1917  WBC 9.8  HGB 9.1*  HCT 31.5*  PLT 461*    Recent Labs Lab 10/18/16 1917  NA 142  K 3.5  CL 98*  CO2 31  BUN 17  CREATININE 1.38*  GLUCOSE 98  CALCIUM 9.4     BNP 10.2  Dg Chest 2 View  Result Date: 10/17/2016 CLINICAL DATA:  56 year old female with history of shortness of breath this morning. EXAM: CHEST  2 VIEW COMPARISON:  Chest x-ray 10/06/2016. FINDINGS: Subtle opacity in the left retrocardiac region concerning for left lower lobe airspace consolidation. Right  lung is clear. No pleural effusions. No evidence of pulmonary edema. Heart size is normal. Upper mediastinal contours are within normal limits. IMPRESSION: 1. Probable early left lower lobe bronchopneumonia. Followup PA and lateral chest X-ray is recommended in 3-4 weeks following trial of antibiotic therapy to ensure resolution and exclude underlying malignancy. Electronically Signed   By: Vinnie Langton M.D.   On: 10/17/2016 19:33    Guadalupe Dawn, MD 10/18/2016, 10:11 PM PGY-1, [place] Intern  pager: [phone], text pages welcome  FPTS Upper-Level Resident Addendum  I have independently interviewed and examined the patient. I have discussed the above with the original author and agree with their documentation. My edits for correction/addition/clarification are in blue. Please see also any attending notes.   Bufford Lope, DO PGY-2, [facility] 10/18/2016 11:37 PM  [place] Service pager: [phone] (text pages welcome through [place])

## 2016-10-18 NOTE — Progress Notes (Signed)
   [name] [name], MD Phone: [phone]  Reason For Visit: Follow up   # SOB:  Patient presenting with SOB. Patient states that yesterday she desated to 74% on her ventilator in the morning while sleeping. She was woken up by her son.She gave herself albuterol x2 puffs and slowly inspired and expired. Patient oxygenation eventually went back up to 96% yesterday. She indicates having cough for the past couple of days. The patient was seen by pulmonology yesterday and no further changes were made to her medications. Patient states that she has felt a tightness around her abdomen feels like she's having increasing fluid. Apparently patient was seen by cardiology today, patient is not sure if any changes were made. Patient was unsure of her cardiologist's name or where she sees her cardiologist. Patient was seen in the ED yesterday and had labs drawn at that time however was not willing to wait and therefore did not get be seen by a provider. patient's chest x-ray was noted to have a bronchi-pneumonia; other labs are all within normal limits. A previous appointment on June 2018 was found for cardiology at [facility].   Past Medical History Reviewed problem list.  Medications- reviewed and updated No additions to family history Social history- patient is a non smoker  Objective: BP 120/70 (BP Location: Right Wrist, Patient Position: Sitting, Cuff Size: Normal)   Pulse 86   Temp 98.6 F (37 C) (Oral)   SpO2 96% Comment: 96 % with oxygen on 6 Gen: NAD, alert, cooperative with exam Cardio: regular rate and rhythm, S1S2 heard, no murmurs appreciated Pulm: patient with bibasilar crackles, CTA B otherwise. Extremities: right leg in cast, left leg with trace pitting edema Abdomen: patient complains of distention, no pain to palpation of abdomen   Assessment/Plan: See problem based a/p  Bronchopneumonia Chronically ill individual. Curb 65- 0 at office visit today. No  systemic symptoms of illness including fever or chills, significant sputum production;the chest x-ray is positive for bronchopneumonia. Discussed with patient,outpatient follow-up versus inpatient observation. She would like to be admitted. The patient transported by EMS to the ED for evaluation as it was 4:30 in the afternoon and no beds were available at the hospital.  -discussed with inpatient team palliative care to be consulted - follow up as an outpatient

## 2016-10-18 NOTE — ED Triage Notes (Signed)
Per EMS: pt from [facility] with c/o sob.  Pt waited in [facility] for same last evening and left without being seen.  Pt on home 02.   PTA vitals: 120/70 BP, 86 HR,  96% on 4l o2

## 2016-10-18 NOTE — Progress Notes (Signed)
Patient evaluated by me between today around 6:30pm. I will complete H&P following completing or resident's note.

## 2016-10-18 NOTE — Progress Notes (Signed)
Tiffany [name] is a 57 y.o. female patient admitted from ED awake, alert - oriented  X 4 - no acute distress noted.  Orientation to room, and floor completed with information packet given to patient/family.  Patient declined safety video at this time.  Admission INP armband ID verified with patient/family, and in place.   SR up x 2, fall assessment complete, with patient and family able to verbalize understanding of risk associated with falls, and verbalized understanding to call nsg before up out of bed.  Call light within reach, patient able to voice, and demonstrate understanding.     Will cont to eval and treat per MD orders.  Celine Ahr, RN 10/18/2016 6:15 PM

## 2016-10-18 NOTE — Progress Notes (Signed)
Pt admitted without home trilogy device.  This RN asked pt to ask family to bring home device.  Spoke to respiratory and stated they will place pt on bipap.  Pt states "that won't work."  When asked to explain further pt states "that machine is what caused me not to wake up and they had to rub my chest hard to wake me up."  Pt stated "I need the big machine."  When asked if pt was on cpap or bipap when this occurred pt stated "I dont know.  If I see the machine I will know if it will work or not."

## 2016-10-18 NOTE — Progress Notes (Signed)
Pharmacy Antibiotic Note  Tiffany [name] is a 56 y.o. female admitted on 10/18/2016 with pneumonia.  Pharmacy has been consulted for cefepime dosing.   Plan: Cefepime 2g every 12 hours Monitor renal function, clinical picture  Temp (24hrs), Avg:98.6 F (37 C), Min:98.6 F (37 C), Max:98.6 F (37 C)   Recent Labs Lab 10/17/16 1844 10/18/16 1917  WBC 9.9 9.8  CREATININE 1.33* 1.38*    Estimated Creatinine Clearance: 61.6 mL/min (A) (by C-G formula based on SCr of 1.38 mg/dL (H)).    Allergies  Allergen Reactions  . Hydroxyzine Anaphylaxis and Shortness Of Breath  . Latuda [Lurasidone Hcl] Anaphylaxis  . Codeine Nausea And Vomiting  . Sulfa Antibiotics Itching    Antimicrobials this admission: Cefepime 10/3 >>   Dose adjustments this admission: N/A  Microbiology results: None ordered yet  Thank you for allowing pharmacy to be a part of this patient's care.  Doylene Canard, PharmD Clinical Pharmacist  Phone: [phone] 10/18/2016 8:42 PM

## 2016-10-19 ENCOUNTER — Other Ambulatory Visit: Payer: Self-pay | Admitting: *Deleted

## 2016-10-19 ENCOUNTER — Encounter (HOSPITAL_COMMUNITY): Payer: Self-pay | Admitting: *Deleted

## 2016-10-19 ENCOUNTER — Ambulatory Visit (HOSPITAL_COMMUNITY): Payer: Self-pay | Admitting: Licensed Clinical Social Worker

## 2016-10-19 DIAGNOSIS — R0600 Dyspnea, unspecified: Secondary | ICD-10-CM

## 2016-10-19 DIAGNOSIS — I5022 Chronic systolic (congestive) heart failure: Secondary | ICD-10-CM

## 2016-10-19 DIAGNOSIS — J180 Bronchopneumonia, unspecified organism: Secondary | ICD-10-CM | POA: Insufficient documentation

## 2016-10-19 LAB — MRSA PCR SCREENING: MRSA by PCR: NEGATIVE

## 2016-10-19 MED ORDER — FUROSEMIDE 10 MG/ML IJ SOLN
INTRAMUSCULAR | Status: AC
  Administered 2016-10-19: 40 mg via INTRAVENOUS
  Filled 2016-10-19: qty 4

## 2016-10-19 MED ORDER — FUROSEMIDE 10 MG/ML IJ SOLN
40.0000 mg | Freq: Once | INTRAMUSCULAR | Status: DC
  Administered 2016-10-19: 40 mg via INTRAVENOUS
  Filled 2016-10-19: qty 4

## 2016-10-19 MED ORDER — FUROSEMIDE 10 MG/ML IJ SOLN
80.0000 mg | Freq: Every day | INTRAMUSCULAR | Status: DC
  Administered 2016-10-20: 80 mg via INTRAVENOUS
  Filled 2016-10-19: qty 8

## 2016-10-19 NOTE — Patient Outreach (Signed)
[facility]) Care Management  10/19/2016  Tiffany [name] [birth year] [id number]   [name] received message from [place], [place] that patient has enrolled with [facility] outpatient palliative services.   [name] will perform a case closure on patient. [name] will submit a case closure request to Verlon Setting, Care Management Assistant with [name] in the form of an inbasket message.    Raynaldo Opitz, LCSW [facility]  Clinical Social Worker cell #: [phone]

## 2016-10-19 NOTE — Consult Note (Addendum)
   [facility] CM Inpatient Consult   10/19/2016  Tiffany [name] 1960/09/19 [id number]   Received call from inpatient RNCM regarding [name].   Discussed that patient is no longer active with [place] Management program due to being enrolled with outpatient palliative care with [facility].  Provided number to inpatient [facility] for [facility] with outpatient palliative care program with [facility] at [phone] ext [phone] for follow up.   Marthenia Rolling, MSN-Ed, RN,BSN [facility] Liaison [phone]

## 2016-10-19 NOTE — Progress Notes (Signed)
Respiratory to room with bipap.  Patient states "that's the machine I can use."  Pt placed on bipap by respiratory therapy.  Pt tolerating bipap at this time.

## 2016-10-19 NOTE — Progress Notes (Signed)
Patient transfer to 2c via bed as per md order.report Given to pt assign rn night shift. Sent antibiotic cefepime 2gm for the 2200 due.

## 2016-10-19 NOTE — Progress Notes (Signed)
Family Medicine Teaching Service Daily Progress Note Intern Pager: [phone]  Patient name: Tiffany [name] Medical record number: [id number] Date of birth: September 06, 1960 Age: 56 y.o. Gender: female  Primary Care Provider: [name], MD Consultants: None Code Status: Full   Pt Overview and Major Events to Date:  Admitted to [place] on 10/3  Assessment and Plan: Tiffany [name] is a 56 y.o. female presenting with SOB and increased swelling starting 10/2. PMH is significant for asthma/COPD (6L home O2 daytime, "ventilator" QHS), CKDII, T2DM, HFpEF, HLD, HTN, Bipolar 1 disorder, paranoid schizophrenia, Hx of cocaine abuse.   Acute on chronic respiratory failure likely d/t PNA.  Patient presented with SOB with cough and has crackles on L lower lung base c/w CXR in ED triage yesterday that was suggestive of early PNA. Afebrile and normal WBC on admission so did not meet sepsis criteria. Unlikely to be CHF exacerbation as BNP 10.2, appears euvolemic, and is at weight of 291lbs which is lower than her last hospital discharge weight of 297lbs. Was seen by pulmonary on 10/2 at which time home breo and spiriva were continued as well as 6L O2 and trilogy ventilator at bedtime. Will treat as hospital acquired pneumonia due to several recent admissions. Will use cefepime to cover for likely causal bacteria. DDX also includes asthma and copd but no wheezing on exam and patient has been compliant with her home inhalers and no sputum production.  - vitals signs per floor routine - cefepime 2g q 12 hours (day 2) - am cbc, bmp - continuous pulse ox - home duonebs q 6 hours prn for wheezing - home albuterol 2.5mg  q 2 hours prn wheening - continue cpap at night - continue home breo, spiriva, singular - trilogy has a battery w/ 6-8hours life, she can likely bring it in  Diastolic CHF not in acute exacerbation Patient with echo on 09/08/2016 with EF 60-65% with normal wall motion. Patient is not fluid  overloaded on exam and has bnp of 10.2. Will monitor I/Os but it is unlikely chf is playing role is patient's presentation. - strict I/Os - daily weights - continue home torsemide 80  T2DM, controlled. Last a1c 07/05/2016 5.6. On metformin 500mg  BID.  - continue home metformin - blood glucose with bmp daily  HTN, stable - continue home norvasc  HLD, stable - continue home statin   GERD - continue home PPI  Schizophrenia, stable home meds include  depakote 1500mg  QD, remeron 45mg  daily, geodon. -continue remeron, lexapro, depakote, geodon  Diabetic neuropathy home gabapentin is 600mg  TID  -continue home gabapentin   Closed fracture of right tibia: Last seen by Dr. Erlinda Hong on 10/12/16 in office WBAT on cam walker w/ [facility] pt. Follow up in 6 weeks for tib fib xrays - PT/OT - xrays as outpatient  FEN/GI: heart healthy, carb modified diet Prophylaxis: lovenox 40mg  subq  Disposition: to home when medically stable  Subjective:  Patient concerned about decreased urine output, 2 small urinations per day recently per her.   She has also felt like she is retaining fluid and has gained weight and girth in her abdomen.  [name] wanting her trilogy machine from home  Objective: Temp:  [97.9 F (36.6 C)-98.9 F (37.2 C)] 98.4 F (36.9 C) (10/04 0400) Pulse Rate:  [65-86] 66 (10/04 0400) Resp:  [16-20] 19 (10/04 0400) BP: (120-132)/(70-83) 132/83 (10/04 0400) SpO2:  [96 %-100 %] 99 % (10/04 0400) Weight:  [289 lb 11 oz (131.4 kg)-291 lb 0.1  oz (132 kg)] 291 lb 0.1 oz (132 kg) (10/04 0500) Physical Exam: General: obese, sitting comfortably on 6 L o2 in chair bedside Eyes: EOMI, PERRL, no conjunctival injections ENTM: no signs of symptoms of trauma bilateral ears, nose, head Neck: no cervical lymphadenopathy, no difficulty with rotation of neck Cardiovascular: regular rate and rhythm,  Respiratory: slightly coarse breath sounds bilaterally, no chest pain, no tachypnea, no increased  work of breathing, on 6L [place] Gastrointestinal: soft, non-tender, non-distended. Bowel sounds present.   Obese MSK: able to move all four extremities, 1+ pitting edema bilterally Derm: warm, dry, well perfused Neuro: 5/5 strength BLE, BUE.  Psych: appropriate  Wt Readings from Last 10 Encounters:  10/19/16 291 lb 0.1 oz (132 kg)  10/10/16 297 lb 3.2 oz (134.8 kg)  10/09/16 295 lb 13.7 oz (134.2 kg)  09/21/16 285 lb 11.2 oz (129.6 kg)  07/16/16 (!) 302 lb 7.5 oz (137.2 kg)  07/05/16 298 lb (135.2 kg)  07/04/16 299 lb (135.6 kg)  06/28/16 299 lb 12.8 oz (136 kg)  06/28/16 (!) 303 lb (137.4 kg)  06/27/16 300 lb (136.1 kg)     Laboratory:  Recent Labs Lab 10/17/16 1844 10/18/16 1917  WBC 9.9 9.8  HGB 9.5* 9.1*  HCT 32.5* 31.5*  PLT 551* 461*    Recent Labs Lab 10/17/16 1844 10/18/16 1917  NA 138 142  K 3.8 3.5  CL 101 98*  CO2 28 31  BUN 15 17  CREATININE 1.33* 1.38*  CALCIUM 9.6 9.4  GLUCOSE 115* 98     Imaging/Diagnostic Tests: Dg Chest 2 View  Result Date: 10/18/2016 CLINICAL DATA:  Dyspnea EXAM: CHEST  2 VIEW COMPARISON:  10/17/2016 FINDINGS: Cardiomegaly with mild central vascular congestion. No consolidation or overt pulmonary edema. No pleural effusion. Degenerative changes of the spine. IMPRESSION: Cardiomegaly with mild central vascular congestion. Electronically Signed   By: Donavan Foil M.D.   On: 10/18/2016 23:51   Dg Chest 2 View  Result Date: 10/17/2016 CLINICAL DATA:  56 year old female with history of shortness of breath this morning. EXAM: CHEST  2 VIEW COMPARISON:  Chest x-ray 10/06/2016. FINDINGS: Subtle opacity in the left retrocardiac region concerning for left lower lobe airspace consolidation. Right lung is clear. No pleural effusions. No evidence of pulmonary edema. Heart size is normal. Upper mediastinal contours are within normal limits. IMPRESSION: 1. Probable early left lower lobe bronchopneumonia. Followup PA and lateral chest X-ray is  recommended in 3-4 weeks following trial of antibiotic therapy to ensure resolution and exclude underlying malignancy. Electronically Signed   By: Vinnie Langton M.D.   On: 10/17/2016 19:33   Dg Tibia/fibula Right  Result Date: 09/19/2016 CLINICAL DATA:  History of ORIF EXAM: RIGHT TIBIA AND FIBULA - 2 VIEW COMPARISON:  08/01/2016, 07/12/2016 FINDINGS: Status post intramedullary rod and screw fixation of the tibia across a healing comminuted fracture involving the midshaft with bridging callus now evident. Healing proximal fibular fracture, similar alignment with 1 shaft diameter of medial displacement of distal fracture fragment and mild overriding. Bridging callus also evident IMPRESSION: 1. No significant change in alignment of healing displaced proximal fibular fracture 2. No significant change in alignment of healing mid tibial fracture status post intramedullary rod and screw fixation. Electronically Signed   By: Donavan Foil M.D.   On: 09/19/2016 22:03   Dg Ankle Complete Left  Result Date: 09/20/2016 CLINICAL DATA:  Fall today.  Ankle pain EXAM: LEFT ANKLE COMPLETE - 3+ VIEW COMPARISON:  None. FINDINGS: Negative for fracture  or dislocation. Mild degenerative change in the ankle joint. No joint effusion. Mild calcaneal spurring. IMPRESSION: Negative for fracture Electronically Signed   By: Franchot Gallo M.D.   On: 09/20/2016 16:35   Ct Angio Chest Pe W Or Wo Contrast  Result Date: 10/07/2016 CLINICAL DATA:  Shortness of breath. Low pretest probability for pulmonary embolus. EXAM: CT ANGIOGRAPHY CHEST WITH CONTRAST TECHNIQUE: Multidetector CT imaging of the chest was performed using the standard protocol during bolus administration of intravenous contrast. Multiplanar CT image reconstructions and MIPs were obtained to evaluate the vascular anatomy. CONTRAST:  100 mL Isovue 370 COMPARISON:  07/14/2016 FINDINGS: Cardiovascular: Contrast bolus is somewhat limited but there is moderately good  opacification of the central and proximal segmental pulmonary arteries. No filling defects are demonstrated. No evidence of significant pulmonary embolus. Smaller peripheral emboli are not entirely excluded. Cardiac enlargement. No pericardial effusions. Normal caliber thoracic aorta. Mediastinum/Nodes: Mediastinal lymph nodes are not pathologically enlarged. Esophagus is decompressed Lungs/Pleura: Evaluation of the lungs is limited due to respiratory motion. There appears to be mild mosaic pattern which could indicate edema. Mild atelectasis in the lung bases. Edema pattern is improved since previous study. No pleural effusions. No pneumothorax. Upper Abdomen: No acute process demonstrated in the visualized upper abdomen. Musculoskeletal: Degenerative changes in spine. No destructive bone lesions. Review of the MIP images confirms the above findings. IMPRESSION: 1. No evidence of significant central pulmonary embolus. 2. Cardiac enlargement with suggestion of pulmonary edema, improved since previous study. Electronically Signed   By: Lucienne Capers M.D.   On: 10/07/2016 06:05   Dg Chest Portable 1 View  Result Date: 10/06/2016 CLINICAL DATA:  Patient experiencing short of breath and LEFT chest pain for 24 hours. EXAM: PORTABLE CHEST 1 VIEW COMPARISON:  09/14/2016 FINDINGS: Normal cardiac silhouette. No effusion, infiltrate or pneumothorax. Mild central venous congestion. No acute osseous abnormality. IMPRESSION: Mild central venous congestion.  No infiltrate. Electronically Signed   By: Suzy Bouchard M.D.   On: 10/06/2016 23:53   Xr Tibia/fibula Right  Result Date: 10/12/2016 Abundant callus formation. Stable fixation.    Sherene Sires, DO 10/19/2016, 6:57 AM PGY-1, [place] Intern pager: [phone], text pages welcome

## 2016-10-19 NOTE — Discharge Summary (Signed)
[facility] Discharge Summary  Patient name: Tiffany [name] Medical record number: [id number] Date of birth: 1960-02-11 Age: 56 y.o. Gender: female Date of Admission: 10/18/2016  Date of Discharge: 10/20/16 Admitting Physician: Dickie La, MD  Primary Care Provider: [name], MD Consultants: None  Indication for Hospitalization: SOB   Discharge Diagnoses/Problem List:  Acute on chronic respiratory failure likely d/t PNA Diastolic CHF not in acute exacerbation T2DM HTN HLD GERD Schizophrenia Diabetic neuropathy Closed fracture of right tibia  Disposition: to home with son  Discharge Condition: stable  Discharge Exam: General: obese, sitting comfortably on 6 L o2 in chair bedside Eyes: EOMI, PERRL, no conjunctival injections ENTM: no signs of symptoms of trauma bilateral ears, nose, head Neck: no cervical lymphadenopathy, no difficulty with rotation of neck Cardiovascular: regular rate and rhythm,  Respiratory: breath sounds improved bilaterally, no chest pain, no tachypnea, no increased work of breathing, on 6L [place] Gastrointestinal: soft, non-tender, non-distended. Bowel sounds present.   Obese MSK: able to move all four extremities, 1+pitting edema bilterally Derm: warm, dry, well perfused Neuro: 5/5 strength BLE, BUE.  Psych: appropriate  Brief Hospital Course:  Tiffany [name] is a 56 y.o. female presening with SOB x 1 day prior to admission. Patient states she noticed that while using her ventilator O2 saturations were 74%. She at that time went to the ED where she received one albuterol treatment and CXR with possible early LLL bronchopneumonia. O2 saturations improved to 90s after treatment and patient left ED because she stated she did not want to wait long to be seen. Patient followed up with PCP on 10/3 and was admitted by PCP as a direct admit at that time due to increased work of breathing. Patient appeared euvolemic on  admission with BNP of 10.2, making CHF exacerbation less likely. Patient was treated for hospital acquired pneumonia, given recent admissions, with cefepime.   Respiratory status was stable overnight on home trilogy machine and patient requested to be discharged be she had things to do at home.   She was scheduled for a prompt followup in our clinic and discharged  Issues for Follow Up:  1. Patient was requesting portable O2 tank, please assist as needed with obtaining this 2. [facility] should be coordinating home health, please assist as needed 3. Patient discharged on cefdinir, please confirm compliance  Significant Procedures:   Significant Labs and Imaging:   Recent Labs Lab 10/17/16 1844 10/18/16 1917 10/20/16 0249  WBC 9.9 9.8 9.2  HGB 9.5* 9.1* 8.6*  HCT 32.5* 31.5* 29.4*  PLT 551* 461* 405*    Recent Labs Lab 10/17/16 1844 10/18/16 1917 10/20/16 0249  NA 138 142 139  K 3.8 3.5 3.1*  CL 101 98* 95*  CO2 28 31 31   GLUCOSE 115* 98 100*  BUN 15 17 19   CREATININE 1.33* 1.38* 1.53*  CALCIUM 9.6 9.4 9.1     Dg Chest 2 View  Result Date: 10/18/2016 CLINICAL DATA:  Dyspnea EXAM: CHEST  2 VIEW COMPARISON:  10/17/2016 FINDINGS: Cardiomegaly with mild central vascular congestion. No consolidation or overt pulmonary edema. No pleural effusion. Degenerative changes of the spine. IMPRESSION: Cardiomegaly with mild central vascular congestion. Electronically Signed   By: Donavan Foil M.D.   On: 10/18/2016 23:51   Dg Chest 2 View  Result Date: 10/17/2016 CLINICAL DATA:  56 year old female with history of shortness of breath this morning. EXAM: CHEST  2 VIEW COMPARISON:  Chest x-ray 10/06/2016. FINDINGS: Subtle opacity  in the left retrocardiac region concerning for left lower lobe airspace consolidation. Right lung is clear. No pleural effusions. No evidence of pulmonary edema. Heart size is normal. Upper mediastinal contours are within normal limits. IMPRESSION: 1. Probable early left  lower lobe bronchopneumonia. Followup PA and lateral chest X-ray is recommended in 3-4 weeks following trial of antibiotic therapy to ensure resolution and exclude underlying malignancy. Electronically Signed   By: Vinnie Langton M.D.   On: 10/17/2016 19:33   Ct Angio Chest Pe W Or Wo Contrast  Result Date: 10/07/2016 CLINICAL DATA:  Shortness of breath. Low pretest probability for pulmonary embolus. EXAM: CT ANGIOGRAPHY CHEST WITH CONTRAST TECHNIQUE: Multidetector CT imaging of the chest was performed using the standard protocol during bolus administration of intravenous contrast. Multiplanar CT image reconstructions and MIPs were obtained to evaluate the vascular anatomy. CONTRAST:  100 mL Isovue 370 COMPARISON:  07/14/2016 FINDINGS: Cardiovascular: Contrast bolus is somewhat limited but there is moderately good opacification of the central and proximal segmental pulmonary arteries. No filling defects are demonstrated. No evidence of significant pulmonary embolus. Smaller peripheral emboli are not entirely excluded. Cardiac enlargement. No pericardial effusions. Normal caliber thoracic aorta. Mediastinum/Nodes: Mediastinal lymph nodes are not pathologically enlarged. Esophagus is decompressed Lungs/Pleura: Evaluation of the lungs is limited due to respiratory motion. There appears to be mild mosaic pattern which could indicate edema. Mild atelectasis in the lung bases. Edema pattern is improved since previous study. No pleural effusions. No pneumothorax. Upper Abdomen: No acute process demonstrated in the visualized upper abdomen. Musculoskeletal: Degenerative changes in spine. No destructive bone lesions. Review of the MIP images confirms the above findings. IMPRESSION: 1. No evidence of significant central pulmonary embolus. 2. Cardiac enlargement with suggestion of pulmonary edema, improved since previous study. Electronically Signed   By: Lucienne Capers M.D.   On: 10/07/2016 06:05   Dg Chest Portable  1 View  Result Date: 10/06/2016 CLINICAL DATA:  Patient experiencing short of breath and LEFT chest pain for 24 hours. EXAM: PORTABLE CHEST 1 VIEW COMPARISON:  09/14/2016 FINDINGS: Normal cardiac silhouette. No effusion, infiltrate or pneumothorax. Mild central venous congestion. No acute osseous abnormality. IMPRESSION: Mild central venous congestion.  No infiltrate. Electronically Signed   By: Suzy Bouchard M.D.   On: 10/06/2016 23:53   Xr Tibia/fibula Right  Result Date: 10/12/2016 Abundant callus formation. Stable fixation.    Results/Tests Pending at Time of Discharge:  Unresulted Labs    None      Discharge Medications:  Allergies as of 10/20/2016      Reactions   Hydroxyzine Anaphylaxis, Shortness Of Breath   Latuda [lurasidone Hcl] Anaphylaxis   Codeine Nausea And Vomiting   Sulfa Antibiotics Itching      Medication List    TAKE these medications   amLODipine 5 MG tablet Commonly known as:  NORVASC Take 1 tablet (5 mg total) by mouth daily.   aspirin 81 MG EC tablet Take 1 tablet (81 mg total) by mouth daily. Swallow whole.   atorvastatin 40 MG tablet Commonly known as:  LIPITOR Take 1 tablet (40 mg total) by mouth daily at 6 PM. What changed:  how much to take  how to take this  when to take this  additional instructions   cefdinir 300 MG capsule Commonly known as:  OMNICEF Take 1 capsule (300 mg total) by mouth 2 (two) times daily.   divalproex 500 MG 24 hr tablet Commonly known as:  [name] ER Take 3 tablets (1,500 mg total)  by mouth at bedtime. What changed:  how much to take  how to take this  when to take this  additional instructions   escitalopram 20 MG tablet Commonly known as:  LEXAPRO Take 1 tablet (20 mg total) by mouth daily.   fluticasone furoate-vilanterol 100-25 MCG/INH Aepb Commonly known as:  BREO ELLIPTA INHALE 1 PUFF EVERY DAY   gabapentin 600 MG tablet Commonly known as:  NEURONTIN Take 1 tablet (600 mg total)  by mouth 3 (three) times daily.   metFORMIN 500 MG 24 hr tablet Commonly known as:  GLUCOPHAGE-XR Take 1 tablet (500 mg total) by mouth 2 (two) times daily.   mirtazapine 30 MG disintegrating tablet Commonly known as:  REMERON SOL-TAB Take 1 tablet (30 mg total) by mouth at bedtime.   montelukast 10 MG tablet Commonly known as:  SINGULAIR Take 1 tablet (10 mg total) by mouth at bedtime.   multivitamin with minerals Tabs tablet Take 1 tablet by mouth daily.   omeprazole 40 MG capsule Commonly known as:  PRILOSEC Take 1 capsule (40 mg total) by mouth 2 (two) times daily.   OXYGEN Inhale 6 L into the lungs daily.   potassium chloride SA 20 MEQ tablet Commonly known as:  K-DUR,KLOR-CON Take 1 tablet (20 mEq total) by mouth 2 (two) times daily. What changed:  how much to take   tiotropium 18 MCG inhalation capsule Commonly known as:  SPIRIVA HANDIHALER Place 1 capsule (18 mcg total) into inhaler and inhale daily.   torsemide 20 MG tablet Commonly known as:  DEMADEX Take 4 tablets (80 mg total) by mouth daily.   VENTOLIN HFA 108 (90 Base) MCG/ACT inhaler Generic drug:  albuterol Inhale 2 puffs into the lungs every 6 (six) hours as needed for wheezing or shortness of breath.   albuterol (2.5 MG/3ML) 0.083% nebulizer solution Commonly known as:  PROVENTIL Take 3 mLs (2.5 mg total) by nebulization every 6 (six) hours as needed for wheezing or shortness of breath.   ziprasidone 80 MG capsule Commonly known as:  GEODON Take 1 capsule (80 mg total) by mouth at bedtime.       Discharge Instructions: Please refer to Patient Instructions section of EMR for full details.  Patient was counseled important signs and symptoms that should prompt return to medical care, changes in medications, dietary instructions, activity restrictions, and follow up appointments.   Follow-Up Appointments:   Sherene Sires, DO 10/24/2016, 7:04 AM PGY-1, [place]

## 2016-10-19 NOTE — Assessment & Plan Note (Addendum)
Chronically ill individual. Curb 65- 0 at office visit today. No systemic symptoms of illness including fever or chills, no significant sputum production;the chest x-ray is positive for bronchopneumonia. Discussed with patient,outpatient follow-up versus inpatient observation. She would like to be admitted. The patient transported by EMS to the ED for evaluation as it was 4:30 in the afternoon and no beds were available at the hospital.  -discussed with inpatient team palliative care to be consulted - follow up as an outpatient

## 2016-10-19 NOTE — Care Management Note (Addendum)
Case Management Note    Patient Details  Name: Tiffany [name] MRN: [id number] Date of Birth: 16-Jun-1960  Subjective/Objective:                    Action/Plan:  PTA independent from home with adult son.  Pt has [facility] RN, PT, OT and aide - agency aware of admit.  Pt also has home oxygen 6L continuous and tribology vent in the home supplied by [facility]  Pt does not have portable tank for transport home and will need tank prior to discharge.    Pt will transport home via private vehicle driven by son.  Pt is no longer active with [name] but is active with [facility] is the liaison [phone].  CM left voicemail for [facility].     Expected Discharge Date:  10/21/16               Expected Discharge Plan:  [place]  In-House Referral:     Discharge planning Services  CM Consult  Post Acute Care Choice:    Choice offered to:     DME Arranged:    DME Agency:     HH Arranged:  RN, PT, OT, Nurse's Aide [place] Agency:  [facility]  Status of Service:  In process, will continue to follow  If discussed at Long Length of Stay Meetings, dates discussed:    Additional Comments: Maria with [facility] Palliative program will contact pts specific NP and request she contact pt directly. [facility] made aware of admit and need for portable oxygen tank at discharge Maryclare Labrador, RN 10/19/2016, 11:47 AM

## 2016-10-20 ENCOUNTER — Other Ambulatory Visit (HOSPITAL_COMMUNITY): Payer: Self-pay | Admitting: Psychiatry

## 2016-10-20 ENCOUNTER — Ambulatory Visit: Payer: Self-pay | Admitting: *Deleted

## 2016-10-20 LAB — BASIC METABOLIC PANEL
Anion gap: 13 (ref 5–15)
BUN: 19 mg/dL (ref 6–20)
CO2: 31 mmol/L (ref 22–32)
Calcium: 9.1 mg/dL (ref 8.9–10.3)
Chloride: 95 mmol/L — ABNORMAL LOW (ref 101–111)
Creatinine, Ser: 1.53 mg/dL — ABNORMAL HIGH (ref 0.44–1.00)
GFR calc Af Amer: 43 mL/min — ABNORMAL LOW (ref >60)
GFR calc non Af Amer: 37 mL/min — ABNORMAL LOW (ref >60)
Glucose, Bld: 100 mg/dL — ABNORMAL HIGH (ref 65–99)
Potassium: 3.1 mmol/L — ABNORMAL LOW (ref 3.5–5.1)
Sodium: 139 mmol/L (ref 135–145)

## 2016-10-20 LAB — CBC
HCT: 29.4 % — ABNORMAL LOW (ref 36.0–46.0)
Hemoglobin: 8.6 g/dL — ABNORMAL LOW (ref 12.0–15.0)
MCH: 28.3 pg (ref 26.0–34.0)
MCHC: 29.3 g/dL — ABNORMAL LOW (ref 30.0–36.0)
MCV: 96.7 fL (ref 78.0–100.0)
Platelets: 405 10*3/uL — ABNORMAL HIGH (ref 150–400)
RBC: 3.04 MIL/uL — ABNORMAL LOW (ref 3.87–5.11)
RDW: 16.6 % — ABNORMAL HIGH (ref 11.5–15.5)
WBC: 9.2 10*3/uL (ref 4.0–10.5)

## 2016-10-20 MED ORDER — CEFDINIR 300 MG PO CAPS: 300.0000 mg | ORAL_CAPSULE | Freq: Two times a day (BID) | ORAL | 0 refills | Status: DC

## 2016-10-20 MED ORDER — ATORVASTATIN CALCIUM 40 MG PO TABS: 40.0000 mg | ORAL_TABLET | Freq: Every day | ORAL | 1 refills | Status: DC

## 2016-10-20 NOTE — Progress Notes (Signed)
Patient placed on home trilogy bipap via AVAPS mode with oxygen set at 2lpm.

## 2016-10-20 NOTE — Discharge Instructions (Signed)
Acute Respiratory Distress Syndrome, Adult Acute respiratory distress syndrome is a life-threatening condition in which fluid collects in the lungs. This prevents the lungs from filling with air and passing oxygen into the blood. This can cause the lungs and other vital organs to fail. The condition usually develops following an infection, illness, surgery, or injury. What are the causes? This condition may be caused by:  An infection, such as sepsis or pneumonia.  A serious injury to the head or chest.  Severe bleeding from an injury.  A major surgery.  Breathing in harmful chemicals or smoke.  Blood transfusions.  A blood clot in the lungs.  Breathing in vomit (aspiration).  Near-drowning.  Inflammation of the pancreas (pancreatitis).  A drug overdose.  What are the signs or symptoms? Sudden shortness of breath and rapid breathing are the main symptoms of this condition. Other symptoms may include:  A fast or irregular heartbeat.  Skin, lips, or fingernails that look blue (cyanosis).  Confusion.  Tiredness or loss of energy.  Chest pain, particularly while taking a breath.  Coughing.  Restlessness or anxiety.  Fever. This is usually present if there is an underlying infection, such as pneumonia.  How is this diagnosed? This condition is diagnosed based on:  Your symptoms.  Medical history.  A physical exam. During the exam, your health care provider will listen to your heart and check for crackling or wheezing sounds in your lungs.  You may also have other tests to confirm the diagnosis and measure how well your lungs are working. These may include:  Measuring the amount of oxygen in your blood. Your health care provider will use two methods to do this procedure: ? A small device (pulse oximeter) that is placed on your finger, earlobe, or toe. ? An arterial blood gas test. A sample of blood is taken from an artery and tested for oxygen levels.  Blood  tests.  Chest X-rays or CT scans to look for fluid in the lungs.  Taking a sample of your sputum to test for infection.  Heart test, such as an echocardiogram or electrocardiogram. This is done to rule out any heart problems (such as heart failure) that may be causing your symptoms.  Bronchoscopy. During this test, a thin, flexible tube with a light is passed into the mouth or nose, down the windpipe, and into the lungs.  How is this treated? Treatment depends on the cause of your condition. The goal is to support you while your lungs heal and the underlying cause is treated. Treatment may include:  Oxygen therapy. This may be done through: ? A tube in your nose or a face mask. ? A ventilator. This device helps move air into and out of your lungs through a breathing tube that is inserted into your mouth or nose.  Continuous positive airway pressure (CPAP). This treatment uses mild air pressure to keep the airways open. A mask or other device will be placed over your nose or mouth.  Tracheostomy. During this procedure, a small cut is made in your neck to create an opening to your windpipe. A breathing tube is placed directly into your windpipe. The breathing tube is connected to a ventilator. This is done if you have problems with your airway or if you need a ventilator for a long period of time.  Positioning you to lie on your stomach (prone position).  Medicines, such as: ? Sedatives to help you relax. ? Blood pressure medicines. ? Antibiotics to  treat infection. ? Blood thinners to prevent blood clots. ? Diuretics to help prevent excess fluid.  Fluids and nutrients given through an IV tube.  Wearing compression stockings on your legs to prevent blood clots.  Extra corporeal membrane oxygenation (ECMO). This treatment takes blood outside your body, adds oxygen, and removes carbon dioxide. The blood is then returned to your body. This treatment is only used in severe  cases.  Follow these instructions at home:  Take over-the-counter and prescription medicines only as told by your health care provider.  Do not use any products that contain nicotine or tobacco, such as cigarettes and e-cigarettes. If you need help quitting, ask your health care provider.  Limit alcohol intake to no more than 1 drink per day for nonpregnant women and 2 drinks per day for men. One drink equals 12 oz of beer, 5 oz of wine, or 1 oz of hard liquor.  Ask friends and family to help you if daily activities make you tired.  Attend any pulmonary rehabilitation as told by your health care provider. This may include: ? Education about your condition. ? Exercises. ? Breathing training. ? Counseling. ? Learning techniques to conserve energy. ? Nutrition counseling.  Keep all follow-up visits as told by your health care provider. This is important. Contact a health care provider if:  You become short of breath during activity or while resting.  You develop a cough that does not go away.  You have a fever.  Your symptoms do not get better or they get worse.  You become anxious or depressed. Get help right away if:  You have sudden shortness of breath.  You develop sudden chest pain that does not go away.  You develop a rapid heart rate.  You develop swelling or pain in one of your legs.  You cough up blood.  You have trouble breathing.  Your skin, lips, or fingernails turn blue. These symptoms may represent a serious problem that is an emergency. Do not wait to see if the symptoms will go away. Get medical help right away. Call your local emergency services (911 in the [place] Do not drive yourself to the hospital. Summary  Acute respiratory distress syndrome is a life-threatening condition in which fluid collects in the lungs, which leads the lungs and other vital organs to fail.  This condition usually develops following an infection, illness, surgery, or  injury.  Sudden shortness of breath and rapid breathing are the main symptoms of acute respiratory distress syndrome.  Treatment may include oxygen therapy, continuous positive airway pressure (CPAP), tracheostomy, lying on your stomach (prone position), medicines, fluids and nutrients given through an IV tube, compression stockings, and extra corporeal membrane oxygenation (ECMO). This information is not intended to replace advice given to you by your health care provider. Make sure you discuss any questions you have with your health care provider. Document Released: 01/02/2005 Document Revised: 12/20/2015 Document Reviewed: 12/20/2015 Elsevier Interactive Patient Education  2017 [employer]. It has been a pleasure taking care of you! You were admitted due to shortness of breath. We have treated you with breathing treatments and medication to remove some fluid off you. With that your symptoms improved to the point we think it is safe to let you go home and follow up with your primary care doctor. We are discharging your home medications that you need to continue taking after you leave the hospital. There could be some changes made to your home medications during this hospitalization. Please,  make sure to read the directions before you take them. The names and directions on how to take these medications are found on this discharge paper under medication section.  Please follow up at your primary care doctor's office. The address, date and time are found on the discharge paper under follow up section.  Once you are discharged, your primary care physician will handle any further medical issues. Please note that NO REFILLS for any discharge medications will be authorized once you are discharged, as it is imperative that you return to your primary care physician (or establish a relationship with a primary care physician if you do not have one) for your aftercare needs so that they can reassess your need  for medications and monitor your lab values. Take care,

## 2016-10-20 NOTE — Progress Notes (Signed)
Explained and discussed discharge instructions, follow up appt, prescriptions given. Pt going home with son to home with belongings, cpap machine, oxygen tank and breo and spiriva inhalers. No complaints at this time.

## 2016-10-20 NOTE — Progress Notes (Signed)
Family Medicine Teaching Service Daily Progress Note Intern Pager: [phone]  Patient name: Tiffany [name] Medical record number: [id number] Date of birth: [birth year] Age: 56 y.o. Gender: female  Primary Care Provider: [name], MD Consultants: None Code Status: Full   Pt Overview and Major Events to Date:  Admitted to [facility] on 10/3  Assessment and Plan: Tiffany [name] is a 56 y.o. female presenting with SOB and increased swelling starting 10/2. PMH is significant for asthma/COPD (6L home O2 daytime, "ventilator" QHS), CKDII, T2DM, HFpEF, HLD, HTN, Bipolar 1 disorder, paranoid schizophrenia, Hx of cocaine abuse.   Acute on chronic respiratory failure likely d/t PNA.  Patient presented with SOB with cough and has crackles on L lower lung base c/w CXR in ED triage yesterday that was suggestive of early PNA. Afebrile and normal WBC on admission so did not meet sepsis criteria. Unlikely to be CHF exacerbation as BNP 10.2, appears euvolemic, and is at weight of 291lbs which is lower than her last hospital discharge weight of 297lbs. Was seen by pulmonary on 10/2 at which time home breo and spiriva were continued as well as 6L O2 and trilogy ventilator at bedtime. Will treat as hospital acquired pneumonia due to several recent admissions. Will use cefepime to cover for likely causal bacteria. DDX also includes asthma and copd but no wheezing on exam and patient has been compliant with her home inhalers and no sputum production.  - vitals signs per floor routine -6L nasal cannula - cefepime 2g q 12 hours (day 2) - am cbc, bmp - continuous pulse ox - home duonebs q 6 hours prn for wheezing - home albuterol 2.5mg  q 2 hours prn wheening - continue home trilogy at night - continue home breo, spiriva, singular - CM trying to arrange a portable O2 tank for er 6L nasal cannula   Diastolic CHF not in acute exacerbation Patient with echo on 09/08/2016 with EF 60-65% with normal wall  motion. Patient is not fluid overloaded on exam and has bnp of 10.2. Will monitor I/Os but it is unlikely chf is playing role is patient's presentation. - strict I/Os - daily weights - continue home torsemide 80 (patient thinks she was taking 60mg  BID).  Consider D/C on higher dose temporarily  T2DM, controlled. Last a1c 07/05/2016 5.6. On metformin 500mg  BID.  - continue home metformin - blood glucose with bmp daily  Rising creatinine- within baseline range of 1.7-2.2.  This hospitalization 1.33<1.38<1.53 -will follow  Anemia- baseline 8s.  This hospitalization is 9.5<9.1<8.6 -appears chronic, no interventional likely needed, will follow  HTN, stable - continue home norvasc  HLD, stable - continue home statin   GERD - continue home PPI  Schizophrenia, stable home meds include  depakote 1500mg  QD, remeron 45mg  daily, geodon. -continue remeron, lexapro, depakote, geodon  Diabetic neuropathy home gabapentin is 600mg  TID  -continue home gabapentin   Closed fracture of right tibia: Last seen by Dr. Erlinda Hong on 10/12/16 in office [facility] on [facility] w/ [facility] pt. Follow up in 6 weeks for tib fib xrays - PT/OT - xrays as outpatient  FEN/GI: heart healthy, carb modified diet Prophylaxis: lovenox 40mg  subq  Disposition: to home when medically stable  Subjective:  Patient thought she was taking 60mg  torsemide BID at home, also states she is urinating more on hospital lasix 80daily.  Wants to stay one more day and is requesting repeat CXR.  Objective: Temp:  [97.5 F (36.4 C)-98.3 F (36.8 C)] 98.3 F (36.8 C) (  10/05 0351) Pulse Rate:  [63-82] 63 (10/05 0351) Resp:  [15-20] 15 (10/05 0351) BP: (96-143)/(59-101) 114/74 (10/05 0351) SpO2:  [97 %-99 %] 99 % (10/05 0351) Physical Exam: General: obese, sitting comfortably on 6 L o2 in chair bedside Eyes: EOMI, PERRL, no conjunctival injections ENTM: no signs of symptoms of trauma bilateral ears, nose, head Neck: no cervical  lymphadenopathy, no difficulty with rotation of neck Cardiovascular: regular rate and rhythm,  Respiratory: breath sounds improved bilaterally, no chest pain, no tachypnea, no increased work of breathing, on 6L [place] Gastrointestinal: soft, non-tender, non-distended. Bowel sounds present.   Obese MSK: able to move all four extremities, 1+ pitting edema bilterally Derm: warm, dry, well perfused Neuro: 5/5 strength BLE, BUE.  Psych: appropriate  Wt Readings from Last 10 Encounters:  10/19/16 291 lb 0.1 oz (132 kg)  10/10/16 297 lb 3.2 oz (134.8 kg)  10/09/16 295 lb 13.7 oz (134.2 kg)  09/21/16 285 lb 11.2 oz (129.6 kg)  07/16/16 (!) 302 lb 7.5 oz (137.2 kg)  07/05/16 298 lb (135.2 kg)  07/04/16 299 lb (135.6 kg)  06/28/16 299 lb 12.8 oz (136 kg)  06/28/16 (!) 303 lb (137.4 kg)  06/27/16 300 lb (136.1 kg)     Laboratory:  Recent Labs Lab 10/17/16 1844 10/18/16 1917 10/20/16 0249  WBC 9.9 9.8 9.2  HGB 9.5* 9.1* 8.6*  HCT 32.5* 31.5* 29.4*  PLT 551* 461* 405*    Recent Labs Lab 10/17/16 1844 10/18/16 1917 10/20/16 0249  NA 138 142 139  K 3.8 3.5 3.1*  CL 101 98* 95*  CO2 28 31 31   BUN 15 17 19   CREATININE 1.33* 1.38* 1.53*  CALCIUM 9.6 9.4 9.1  GLUCOSE 115* 98 100*     Imaging/Diagnostic Tests: Dg Chest 2 View  Result Date: 10/18/2016 CLINICAL DATA:  Dyspnea EXAM: CHEST  2 VIEW COMPARISON:  10/17/2016 FINDINGS: Cardiomegaly with mild central vascular congestion. No consolidation or overt pulmonary edema. No pleural effusion. Degenerative changes of the spine. IMPRESSION: Cardiomegaly with mild central vascular congestion. Electronically Signed   By: Donavan Foil M.D.   On: 10/18/2016 23:51   Dg Chest 2 View  Result Date: 10/17/2016 CLINICAL DATA:  56 year old female with history of shortness of breath this morning. EXAM: CHEST  2 VIEW COMPARISON:  Chest x-ray 10/06/2016. FINDINGS: Subtle opacity in the left retrocardiac region concerning for left lower lobe  airspace consolidation. Right lung is clear. No pleural effusions. No evidence of pulmonary edema. Heart size is normal. Upper mediastinal contours are within normal limits. IMPRESSION: 1. Probable early left lower lobe bronchopneumonia. Followup PA and lateral chest X-ray is recommended in 3-4 weeks following trial of antibiotic therapy to ensure resolution and exclude underlying malignancy. Electronically Signed   By: Vinnie Langton M.D.   On: 10/17/2016 19:33   Dg Ankle Complete Left  Result Date: 09/20/2016 CLINICAL DATA:  Fall today.  Ankle pain EXAM: LEFT ANKLE COMPLETE - 3+ VIEW COMPARISON:  None. FINDINGS: Negative for fracture or dislocation. Mild degenerative change in the ankle joint. No joint effusion. Mild calcaneal spurring. IMPRESSION: Negative for fracture Electronically Signed   By: Franchot Gallo M.D.   On: 09/20/2016 16:35   Ct Angio Chest Pe W Or Wo Contrast  Result Date: 10/07/2016 CLINICAL DATA:  Shortness of breath. Low pretest probability for pulmonary embolus. EXAM: CT ANGIOGRAPHY CHEST WITH CONTRAST TECHNIQUE: Multidetector CT imaging of the chest was performed using the standard protocol during bolus administration of intravenous contrast. Multiplanar CT image  reconstructions and MIPs were obtained to evaluate the vascular anatomy. CONTRAST:  100 mL Isovue 370 COMPARISON:  07/14/2016 FINDINGS: Cardiovascular: Contrast bolus is somewhat limited but there is moderately good opacification of the central and proximal segmental pulmonary arteries. No filling defects are demonstrated. No evidence of significant pulmonary embolus. Smaller peripheral emboli are not entirely excluded. Cardiac enlargement. No pericardial effusions. Normal caliber thoracic aorta. Mediastinum/Nodes: Mediastinal lymph nodes are not pathologically enlarged. Esophagus is decompressed Lungs/Pleura: Evaluation of the lungs is limited due to respiratory motion. There appears to be mild mosaic pattern which could  indicate edema. Mild atelectasis in the lung bases. Edema pattern is improved since previous study. No pleural effusions. No pneumothorax. Upper Abdomen: No acute process demonstrated in the visualized upper abdomen. Musculoskeletal: Degenerative changes in spine. No destructive bone lesions. Review of the MIP images confirms the above findings. IMPRESSION: 1. No evidence of significant central pulmonary embolus. 2. Cardiac enlargement with suggestion of pulmonary edema, improved since previous study. Electronically Signed   By: Lucienne Capers M.D.   On: 10/07/2016 06:05   Dg Chest Portable 1 View  Result Date: 10/06/2016 CLINICAL DATA:  Patient experiencing short of breath and LEFT chest pain for 24 hours. EXAM: PORTABLE CHEST 1 VIEW COMPARISON:  09/14/2016 FINDINGS: Normal cardiac silhouette. No effusion, infiltrate or pneumothorax. Mild central venous congestion. No acute osseous abnormality. IMPRESSION: Mild central venous congestion.  No infiltrate. Electronically Signed   By: Suzy Bouchard M.D.   On: 10/06/2016 23:53   Xr Tibia/fibula Right  Result Date: 10/12/2016 Abundant callus formation. Stable fixation.    Sherene Sires, DO 10/20/2016, 6:05 AM PGY-1, [place] Intern pager: [phone], text pages welcome

## 2016-10-20 NOTE — Telephone Encounter (Signed)
It is discontinued

## 2016-10-20 NOTE — Care Management Note (Signed)
Case Management Note    Patient Details  Name: Tiffany [name] MRN: [id number] Date of Birth: Jan 29, 1960  Subjective/Objective:                    Action/Plan:  PTA independent from home with adult son.  Pt has [place] for [facility] RN, PT, OT and aide - agency aware of admit.  Pt also has home oxygen 6L continuous and tribology vent in the home supplied by [facility].  Pt does not have portable tank for transport home and will need tank prior to discharge.    Pt will transport home via private vehicle driven by son.  Pt is no longer active with [facility] but is active with [place] is the liaison [phone] ext [phone].  CM left voicemail for [facility].     Expected Discharge Date:  10/20/16               Expected Discharge Plan:  [place]  In-House Referral:     Discharge planning Services  CM Consult  Post Acute Care Choice:    Choice offered to:     DME Arranged:    DME Agency:     HH Arranged:  RN, PT, OT, Nurse's Aide [place] Agency:  [place]  Status of Service:  In process, will continue to follow  If discussed at Long Length of Stay Meetings, dates discussed:    Additional Comments: 10/20/2016 Pt to discharge home today via private vehicle.  [facility] will bring portable tank to pts room - aware of the 6L need.    10/19/16 Maria with [facility] Palliative program will contact pts specific NP and request she contact pt directly. [facility] made aware of admit and need for portable oxygen tank at discharge Maryclare Labrador, RN 10/20/2016, 2:11 PM

## 2016-10-21 DIAGNOSIS — R062 Wheezing: Secondary | ICD-10-CM | POA: Diagnosis not present

## 2016-10-21 DIAGNOSIS — J189 Pneumonia, unspecified organism: Secondary | ICD-10-CM | POA: Diagnosis not present

## 2016-10-21 DIAGNOSIS — J449 Chronic obstructive pulmonary disease, unspecified: Secondary | ICD-10-CM | POA: Diagnosis not present

## 2016-10-21 DIAGNOSIS — R0602 Shortness of breath: Secondary | ICD-10-CM | POA: Diagnosis not present

## 2016-10-21 DIAGNOSIS — M6281 Muscle weakness (generalized): Secondary | ICD-10-CM | POA: Diagnosis not present

## 2016-10-21 DIAGNOSIS — G4733 Obstructive sleep apnea (adult) (pediatric): Secondary | ICD-10-CM | POA: Diagnosis not present

## 2016-10-21 DIAGNOSIS — J45909 Unspecified asthma, uncomplicated: Secondary | ICD-10-CM | POA: Diagnosis not present

## 2016-10-21 DIAGNOSIS — I509 Heart failure, unspecified: Secondary | ICD-10-CM | POA: Diagnosis not present

## 2016-10-21 DIAGNOSIS — J440 Chronic obstructive pulmonary disease with acute lower respiratory infection: Secondary | ICD-10-CM | POA: Diagnosis not present

## 2016-10-23 ENCOUNTER — Other Ambulatory Visit (HOSPITAL_COMMUNITY): Payer: Self-pay | Admitting: Psychiatry

## 2016-10-23 DIAGNOSIS — J441 Chronic obstructive pulmonary disease with (acute) exacerbation: Secondary | ICD-10-CM | POA: Diagnosis not present

## 2016-10-23 DIAGNOSIS — F200 Paranoid schizophrenia: Secondary | ICD-10-CM

## 2016-10-23 DIAGNOSIS — D631 Anemia in chronic kidney disease: Secondary | ICD-10-CM | POA: Diagnosis not present

## 2016-10-23 DIAGNOSIS — E1140 Type 2 diabetes mellitus with diabetic neuropathy, unspecified: Secondary | ICD-10-CM | POA: Diagnosis not present

## 2016-10-23 DIAGNOSIS — I5030 Unspecified diastolic (congestive) heart failure: Secondary | ICD-10-CM | POA: Diagnosis not present

## 2016-10-23 DIAGNOSIS — I130 Hypertensive heart and chronic kidney disease with heart failure and stage 1 through stage 4 chronic kidney disease, or unspecified chronic kidney disease: Secondary | ICD-10-CM | POA: Diagnosis not present

## 2016-10-23 DIAGNOSIS — N182 Chronic kidney disease, stage 2 (mild): Secondary | ICD-10-CM | POA: Diagnosis not present

## 2016-10-23 DIAGNOSIS — S82201D Unspecified fracture of shaft of right tibia, subsequent encounter for closed fracture with routine healing: Secondary | ICD-10-CM | POA: Diagnosis not present

## 2016-10-23 DIAGNOSIS — E1122 Type 2 diabetes mellitus with diabetic chronic kidney disease: Secondary | ICD-10-CM | POA: Diagnosis not present

## 2016-10-23 DIAGNOSIS — I272 Pulmonary hypertension, unspecified: Secondary | ICD-10-CM | POA: Diagnosis not present

## 2016-10-24 ENCOUNTER — Telehealth: Payer: Self-pay | Admitting: Internal Medicine

## 2016-10-24 ENCOUNTER — Other Ambulatory Visit: Payer: Self-pay

## 2016-10-24 ENCOUNTER — Telehealth: Payer: Self-pay | Admitting: *Deleted

## 2016-10-24 ENCOUNTER — Emergency Department (HOSPITAL_COMMUNITY): Payer: Medicare HMO

## 2016-10-24 ENCOUNTER — Encounter (HOSPITAL_COMMUNITY): Payer: Self-pay | Admitting: Emergency Medicine

## 2016-10-24 ENCOUNTER — Emergency Department (HOSPITAL_COMMUNITY)
Admission: EM | Admit: 2016-10-24 | Discharge: 2016-10-24 | Disposition: A | Discharge status: Home / Self Care | Payer: Medicare HMO | Attending: Emergency Medicine | Admitting: Emergency Medicine

## 2016-10-24 DIAGNOSIS — R0602 Shortness of breath: Secondary | ICD-10-CM | POA: Diagnosis present

## 2016-10-24 DIAGNOSIS — N189 Chronic kidney disease, unspecified: Secondary | ICD-10-CM | POA: Diagnosis not present

## 2016-10-24 DIAGNOSIS — Z7984 Long term (current) use of oral hypoglycemic drugs: Secondary | ICD-10-CM | POA: Diagnosis not present

## 2016-10-24 DIAGNOSIS — J189 Pneumonia, unspecified organism: Secondary | ICD-10-CM | POA: Diagnosis not present

## 2016-10-24 DIAGNOSIS — G4733 Obstructive sleep apnea (adult) (pediatric): Secondary | ICD-10-CM | POA: Diagnosis not present

## 2016-10-24 DIAGNOSIS — E1122 Type 2 diabetes mellitus with diabetic chronic kidney disease: Secondary | ICD-10-CM | POA: Insufficient documentation

## 2016-10-24 DIAGNOSIS — J45909 Unspecified asthma, uncomplicated: Secondary | ICD-10-CM | POA: Diagnosis not present

## 2016-10-24 DIAGNOSIS — Z79899 Other long term (current) drug therapy: Secondary | ICD-10-CM | POA: Insufficient documentation

## 2016-10-24 DIAGNOSIS — Z87891 Personal history of nicotine dependence: Secondary | ICD-10-CM | POA: Diagnosis not present

## 2016-10-24 DIAGNOSIS — R0902 Hypoxemia: Secondary | ICD-10-CM | POA: Diagnosis not present

## 2016-10-24 DIAGNOSIS — J449 Chronic obstructive pulmonary disease, unspecified: Secondary | ICD-10-CM | POA: Diagnosis not present

## 2016-10-24 DIAGNOSIS — I130 Hypertensive heart and chronic kidney disease with heart failure and stage 1 through stage 4 chronic kidney disease, or unspecified chronic kidney disease: Secondary | ICD-10-CM | POA: Insufficient documentation

## 2016-10-24 DIAGNOSIS — M7989 Other specified soft tissue disorders: Secondary | ICD-10-CM | POA: Insufficient documentation

## 2016-10-24 DIAGNOSIS — E8770 Fluid overload, unspecified: Secondary | ICD-10-CM | POA: Diagnosis not present

## 2016-10-24 DIAGNOSIS — I5033 Acute on chronic diastolic (congestive) heart failure: Secondary | ICD-10-CM | POA: Insufficient documentation

## 2016-10-24 DIAGNOSIS — R600 Localized edema: Secondary | ICD-10-CM | POA: Diagnosis not present

## 2016-10-24 DIAGNOSIS — R062 Wheezing: Secondary | ICD-10-CM | POA: Diagnosis not present

## 2016-10-24 DIAGNOSIS — I5032 Chronic diastolic (congestive) heart failure: Secondary | ICD-10-CM

## 2016-10-24 DIAGNOSIS — J440 Chronic obstructive pulmonary disease with acute lower respiratory infection: Secondary | ICD-10-CM | POA: Diagnosis not present

## 2016-10-24 LAB — BRAIN NATRIURETIC PEPTIDE: B Natriuretic Peptide: 22 pg/mL (ref 0.0–100.0)

## 2016-10-24 LAB — CBC WITH DIFFERENTIAL/PLATELET
Basophils Absolute: 0 10*3/uL (ref 0.0–0.1)
Basophils Relative: 0 %
Eosinophils Absolute: 0.2 10*3/uL (ref 0.0–0.7)
Eosinophils Relative: 2 %
HCT: 31.3 % — ABNORMAL LOW (ref 36.0–46.0)
Hemoglobin: 9 g/dL — ABNORMAL LOW (ref 12.0–15.0)
Lymphocytes Relative: 28 %
Lymphs Abs: 2.7 10*3/uL (ref 0.7–4.0)
MCH: 28 pg (ref 26.0–34.0)
MCHC: 28.8 g/dL — ABNORMAL LOW (ref 30.0–36.0)
MCV: 97.2 fL (ref 78.0–100.0)
Monocytes Absolute: 0.6 10*3/uL (ref 0.1–1.0)
Monocytes Relative: 6 %
Neutro Abs: 6 10*3/uL (ref 1.7–7.7)
Neutrophils Relative %: 64 %
Platelets: 290 10*3/uL (ref 150–400)
RBC: 3.22 MIL/uL — ABNORMAL LOW (ref 3.87–5.11)
RDW: 16.3 % — ABNORMAL HIGH (ref 11.5–15.5)
WBC: 9.6 10*3/uL (ref 4.0–10.5)

## 2016-10-24 LAB — I-STAT CG4 LACTIC ACID, ED
Lactic Acid, Venous: 2.41 mmol/L (ref 0.5–1.9)
Lactic Acid, Venous: 1.85 mmol/L (ref 0.5–1.9)

## 2016-10-24 LAB — COMPREHENSIVE METABOLIC PANEL
ALT: 13 U/L — ABNORMAL LOW (ref 14–54)
AST: 23 U/L (ref 15–41)
Albumin: 3.5 g/dL (ref 3.5–5.0)
Alkaline Phosphatase: 145 U/L — ABNORMAL HIGH (ref 38–126)
Anion gap: 9 (ref 5–15)
BUN: 22 mg/dL — ABNORMAL HIGH (ref 6–20)
CO2: 35 mmol/L — ABNORMAL HIGH (ref 22–32)
Calcium: 9.4 mg/dL (ref 8.9–10.3)
Chloride: 97 mmol/L — ABNORMAL LOW (ref 101–111)
Creatinine, Ser: 1.35 mg/dL — ABNORMAL HIGH (ref 0.44–1.00)
GFR calc Af Amer: 49 mL/min — ABNORMAL LOW (ref >60)
GFR calc non Af Amer: 43 mL/min — ABNORMAL LOW (ref >60)
Glucose, Bld: 101 mg/dL — ABNORMAL HIGH (ref 65–99)
Potassium: 3.7 mmol/L (ref 3.5–5.1)
Sodium: 141 mmol/L (ref 135–145)
Total Bilirubin: 0.2 mg/dL — ABNORMAL LOW (ref 0.3–1.2)
Total Protein: 6.9 g/dL (ref 6.5–8.1)

## 2016-10-24 LAB — I-STAT TROPONIN, ED: Troponin i, poc: 0 ng/mL (ref 0.00–0.08)

## 2016-10-24 MED ORDER — FUROSEMIDE 40 MG PO TABS: 40.0000 mg | ORAL_TABLET | Freq: Every day | ORAL | 0 refills | Status: DC

## 2016-10-24 MED ORDER — FUROSEMIDE 10 MG/ML IJ SOLN
80.0000 mg | Freq: Once | INTRAMUSCULAR | Status: AC
  Administered 2016-10-24: 80 mg via INTRAVENOUS
  Filled 2016-10-24: qty 8

## 2016-10-24 NOTE — ED Notes (Signed)
Pt states only one attempt at blood draw per person

## 2016-10-24 NOTE — ED Triage Notes (Signed)
Per EMS: Pt from home with c/o exertional sob and fluid retention.  Pt on home 02 (6L).  Pt in no acute distress.

## 2016-10-24 NOTE — Progress Notes (Signed)
I saw Tiffany [name] today in the ED. She was concerned that she had been increasingly distended over the course of today. She denies any changes in her weight, she weighed herself at home over the last couple of days and has been consistently 294 pound. She has a lot of fear regarding her breathing and a lot of anxiety associated with her recent intubation about 1 month ago. Dr. Darl Householder evaluated the patient in the ED today-labs were not concerning chest x-ray without signs of infiltrate or fluid overload. Patient was noted to have a lactic acid that self resolved without any treatment. Patient's exam without any signs of fluid; slight 1+ edema noted in ankle, no edema near the hip, normal lung examination, clear lungs. Patient is going to follow-up both tomorrow in the clinic and the next day with me. She will receive IV Lasix here in the ED and we will increase her torsemide. Will place an order for an outpatient echo and discuss cardiology follow-up.

## 2016-10-24 NOTE — Telephone Encounter (Signed)
Gonzella Lex, NP with palliative care called to report that patient is having bilateral Lowe extremity +1 and abdominal swelling since hospital discharge. Pt denies SOB, 95% room air and 84 heart rate, blood pressure was not taken.  Appt rescheduled for tomorrow for hospital follow with Dr. Erin Hearing. Will forward note to PCP. Advised patient to call EMS if symptoms worsen. Please call [phone] with questions.  Derl Barrow, RN

## 2016-10-24 NOTE — ED Notes (Signed)
Pure wick in place, due to mobility issues.

## 2016-10-24 NOTE — Discharge Instructions (Signed)
Continue your lasix 80 mg twice daily.   Add lasix 40 mg daily in the afternoon for 5 days to help your swelling.   See the clinic tomorrow for appointment to get outpatient echo.   Return to ER if you have worse shortness of breath, leg swelling, abdominal pain and swelling

## 2016-10-24 NOTE — Telephone Encounter (Signed)
I have placed an order for outpatient ECHO. Can you please set up an appointment for patient. Thanks  Tiffany [name]

## 2016-10-24 NOTE — ED Provider Notes (Signed)
[facility] DEPT Provider Note   CSN: [id number] Arrival date & time: 10/24/16  1636     History   Chief Complaint Chief Complaint  Patient presents with  . Fluid Retention  . Shortness of Breath    HPI Tiffany [name] is a 56 y.o. female history of CHF, CK D here presenting with worsening shortness of breath, abdominal distention. Patient was recently admitted for pneumonia and worsening shortness of breath and is still on antibiotics. Patient came in 4 days ago for shortness of breath and left without being seen. She states that over the last several days, she has worsening abdominal swelling and shortness of breath. [name] on 6 L nasal cannula at baseline and has been using her oxygen. Also noticed more leg swelling as well but denies any fevers or worsening cough. She called EMS today after her visiting nurse came and noticed that she was very tachypneic.   The history is provided by the patient.    Past Medical History:  Diagnosis Date  . Anxiety   . Arthritis    "all over" (04/10/2016)  . Asthma   . Cardiac arrest ([place]) 09/08/2016   PEA  . CHF (congestive heart failure) ([place])   . Chronic bronchitis ([place])   . Chronic kidney disease    "I see a kidney dr." (04/10/2016)  . Cocaine abuse ([place])   . Complication of anesthesia    decreased bp, decreased heart rate  . Depression   . Disorder of nervous system   . Emphysema   . GERD (gastroesophageal reflux disease)   . Heart attack ([place]) 1980s  . History of blood transfusion 1994   "couldn't stop bleeding from my period"  . Hyperlipidemia LDL goal <70   . Hypertension   . Incontinence   . Manic depression ([place])   . On home oxygen therapy    "6L; 24/7" (04/10/2016)  . OSA on CPAP    "wear mask sometimes" (04/10/2016)  . Paranoid ([place])    "sometimes; I'm on RX for it" (04/10/2016)  . Pneumonia    "I've had it several times; haven't had it since 06/2015" (04/10/2016)  . Schizophrenia ([place])   . Seasonal allergies   .  Seizures ([place])    "don't know what kind; last one was ~ 1 yr ago" (04/10/2016)  . Sinus trouble   . Stroke [facility]) 1980s   denies residual on 04/10/2016  . Type II diabetes mellitus [facility])     Patient Active Problem List   Diagnosis Date Noted  . Bronchopneumonia 10/19/2016  . Shortness of breath 10/18/2016  . Asthma exacerbation 10/07/2016  . Dyspnea 10/07/2016  . Acute on chronic congestive heart failure ([place])   . Acute left ankle pain   . Acute on chronic systolic congestive heart failure ([place])   . Cardiac arrest ([place]) 09/08/2016  . History of open reduction and internal fixation (ORIF) procedure   . Closed displaced transverse fracture of shaft of right tibia   . Fall   . Overactive bladder 06/07/2016  . QT prolongation   . OSA and COPD overlap syndrome ([place])   . Generalized weakness   . Arthritis   . Bipolar I disorder ([place])   . Acute heart failure ([place])   . Essential hypertension 03/22/2016  . Bipolar affective disorder, mixed, severe, with psychotic behavior ([place]) 11/28/2015  . Cocaine use disorder, severe, dependence ([place]) 11/28/2015  . AKI (acute kidney injury) ([place]) 11/24/2015  . Chronic diastolic congestive heart failure ([place])   .  Chronic respiratory failure with hypoxia and hypercapnia ([place]) 06/22/2015  . Acute respiratory failure with hypoxia and hypercapnia ([place]) 06/22/2015  . Onychomycosis 01/21/2015  . Tobacco use disorder 07/22/2014  . COPD (chronic obstructive pulmonary disease) ([place]) 07/08/2014  . COPD exacerbation ([place]) 06/26/2014  . Knee pain, bilateral 01/22/2013  . Seizure ([place]) 01/04/2013  . Health care maintenance 11/25/2012  . History of kidney stones 06/18/2012  . Chronic pain syndrome 06/18/2012  . Allergic reaction 04/07/2012  . HSV infection 08/30/2011  . Dyslipidemia 04/24/2011  . Anemia 04/24/2011  . Diabetic neuropathy ([place]) 04/24/2011  . Obstructive sleep apnea 10/18/2010  . Asthma 10/18/2010  . Morbid obesity ([place]) 10/18/2010  . Type 2  diabetes mellitus ([place]) 10/18/2010    Past Surgical History:  Procedure Laterality Date  . CESAREAN SECTION  1997  . HERNIA REPAIR    . IR CHOLANGIOGRAM EXISTING TUBE  07/20/2016  . IR PERC CHOLECYSTOSTOMY  05/10/2016  . IR RADIOLOGIST EVAL & MGMT  06/08/2016  . IR SINUS/FIST TUBE CHK-NON GI  07/12/2016  . TIBIA IM NAIL INSERTION Right 07/12/2016   Procedure: INTRAMEDULLARY (IM) NAIL RIGHT TIBIA;  Surgeon: Leandrew Koyanagi, MD;  Location: [facility];  Service: Orthopedics;  Laterality: Right;  . UMBILICAL HERNIA REPAIR  ~ 1963   "that's why I don't have a belly button"  . VAGINAL HYSTERECTOMY      OB History    No data available       Home Medications    Prior to Admission medications   Medication Sig Start Date End Date Taking? Authorizing Provider  albuterol (PROVENTIL) (2.5 MG/3ML) 0.083% nebulizer solution Take 3 mLs (2.5 mg total) by nebulization every 6 (six) hours as needed for wheezing or shortness of breath. 10/16/16  Yes Mikell, Jeani Sow, MD  amLODipine (NORVASC) 5 MG tablet Take 1 tablet (5 mg total) by mouth daily. 06/13/16  Yes Archie Patten, MD  aspirin 81 MG EC tablet Take 1 tablet (81 mg total) by mouth daily. Swallow whole. 06/13/16  Yes Archie Patten, MD  atorvastatin (LIPITOR) 40 MG tablet Take 1 tablet (40 mg total) by mouth daily at 6 PM. Patient taking differently: Take 80 mg by mouth 2 (two) times daily.  10/20/16  Yes Mercy Riding, MD  cefdinir (OMNICEF) 300 MG capsule Take 1 capsule (300 mg total) by mouth 2 (two) times daily. 10/20/16  Yes Mercy Riding, MD  divalproex ([name] ER) 500 MG 24 hr tablet Take 3 tablets (1,500 mg total) by mouth at bedtime. 10/06/16 10/06/17 Yes Arfeen, Arlyce Harman, MD  escitalopram (LEXAPRO) 20 MG tablet Take 1 tablet (20 mg total) by mouth daily. 10/06/16  Yes Arfeen, Arlyce Harman, MD  fluticasone furoate-vilanterol (BREO ELLIPTA) 100-25 MCG/INH AEPB INHALE 1 PUFF EVERY DAY 10/16/16  Yes Mikell, Jeani Sow, MD  gabapentin (NEURONTIN) 600 MG  tablet Take 1 tablet (600 mg total) by mouth 3 (three) times daily. 06/28/16  Yes Archie Patten, MD  metFORMIN (GLUCOPHAGE-XR) 500 MG 24 hr tablet Take 1 tablet (500 mg total) by mouth 2 (two) times daily. 06/28/16  Yes Archie Patten, MD  mirtazapine (REMERON SOL-TAB) 30 MG disintegrating tablet Take 1 tablet (30 mg total) by mouth at bedtime. 10/06/16 10/06/17 Yes Arfeen, Arlyce Harman, MD  montelukast (SINGULAIR) 10 MG tablet Take 1 tablet (10 mg total) by mouth at bedtime. 06/13/16  Yes Archie Patten, MD  Multiple Vitamin (MULTIVITAMIN WITH MINERALS) TABS tablet Take 1 tablet by mouth daily.  Yes [provider]  omeprazole (PRILOSEC) 40 MG capsule Take 1 capsule (40 mg total) by mouth 2 (two) times daily. 08/23/16  Yes Mikell, Jeani Sow, MD  OXYGEN Inhale 6 L into the lungs daily.   Yes [provider]  potassium chloride SA (K-DUR,KLOR-CON) 20 MEQ tablet Take 1 tablet (20 mEq total) by mouth 2 (two) times daily. Patient taking differently: Take 50 mEq by mouth 2 (two) times daily.  09/21/16  Yes Sela Hilding, MD  tiotropium (SPIRIVA HANDIHALER) 18 MCG inhalation capsule Place 1 capsule (18 mcg total) into inhaler and inhale daily. 06/13/16  Yes Archie Patten, MD  torsemide (DEMADEX) 20 MG tablet Take 4 tablets (80 mg total) by mouth daily. Patient taking differently: Take 80 mg by mouth 2 (two) times daily.  10/18/16 01/16/17 Yes Mikell, Jeani Sow, MD  VENTOLIN HFA 108 (90 Base) MCG/ACT inhaler Inhale 2 puffs into the lungs every 6 (six) hours as needed for wheezing or shortness of breath.  06/01/16  Yes [provider]  ziprasidone (GEODON) 80 MG capsule Take 1 capsule (80 mg total) by mouth at bedtime. 10/06/16  Yes Arfeen, Arlyce Harman, MD    Family History Family History  Problem Relation Age of Onset  . Cancer Father        prostate  . Cancer Mother        lung  . Depression Mother   . Depression Sister   . Anxiety disorder Sister   . Schizophrenia  Sister   . Bipolar disorder Sister   . Depression Sister   . Depression Brother   . Heart failure Unknown        cousin    Social History Social History  Substance Use Topics  . Smoking status: Former Smoker    Packs/day: 1.50    Years: 38.00    Types: Cigarettes    Start date: 03/13/1977    Quit date: 04/10/2016  . Smokeless tobacco: Never Used  . Alcohol use No     Allergies   Hydrocodone-acetaminophen; Hydroxyzine; Latuda [lurasidone hcl]; Codeine; and Sulfa antibiotics   Review of Systems Review of Systems  Respiratory: Positive for shortness of breath.   All other systems reviewed and are negative.    Physical Exam Updated Vital Signs BP (!) 161/74   Pulse 77   Temp 98.1 F (36.7 C) (Oral)   Resp 14   Ht 5\' 4"  (1.626 m)   Wt 133.4 kg (294 lb)   SpO2 100%   BMI 50.46 kg/m   Physical Exam  Constitutional: [name] oriented to person, place, and time.  Chronically ill, slightly tachypneic   HENT:  Head: Normocephalic.  Mouth/Throat: Oropharynx is clear and moist.  Eyes: Pupils are equal, round, and reactive to light. Conjunctivae and EOM are normal.  Neck: Normal range of motion. Neck supple.  Cardiovascular: Normal rate, regular rhythm and normal heart sounds.   Pulmonary/Chest:  Diminished bilateral bases   Abdominal: Soft.  Some edema on the abdomen, not distended, nontender   Musculoskeletal:  R leg walking boot in place, 1+ edema, no obvious calf tenderness   Neurological: [name] alert and oriented to person, place, and time.  Skin: Skin is warm.  Psychiatric: She has a normal mood and affect.  Nursing note and vitals reviewed.    ED Treatments / Results  Labs (all labs ordered are listed, but only abnormal results are displayed) Labs Reviewed  CBC WITH DIFFERENTIAL/PLATELET - Abnormal; Notable for the following:  Result Value   RBC 3.22 (*)    Hemoglobin 9.0 (*)    HCT 31.3 (*)    MCHC 28.8 (*)    RDW 16.3 (*)    All other  components within normal limits  COMPREHENSIVE METABOLIC PANEL - Abnormal; Notable for the following:    Chloride 97 (*)    CO2 35 (*)    Glucose, Bld 101 (*)    BUN 22 (*)    Creatinine, Ser 1.35 (*)    ALT 13 (*)    Alkaline Phosphatase 145 (*)    Total Bilirubin 0.2 (*)    GFR calc non Af Amer 43 (*)    GFR calc Af Amer 49 (*)    All other components within normal limits  I-STAT CG4 LACTIC ACID, ED - Abnormal; Notable for the following:    Lactic Acid, Venous 2.41 (*)    All other components within normal limits  CULTURE, BLOOD (ROUTINE X 2)  CULTURE, BLOOD (ROUTINE X 2)  BRAIN NATRIURETIC PEPTIDE  I-STAT TROPONIN, ED  I-STAT CG4 LACTIC ACID, ED    EKG  EKG Interpretation  Date/Time:  Tuesday October 24 2016 16:46:44 EDT Ventricular Rate:  76 PR Interval:    QRS Duration: 81 QT Interval:  369 QTC Calculation: 415 R Axis:   47 Text Interpretation:  Sinus rhythm Low voltage, precordial leads Borderline T abnormalities, lateral leads No significant change since last tracing Confirmed by Wandra Arthurs ([id number]) on 10/24/2016 5:28:23 PM       Radiology Dg Chest Port 1 View  Result Date: 10/24/2016 CLINICAL DATA:  Shortness of breath, emphysema, hypertension EXAM: PORTABLE CHEST 1 VIEW COMPARISON:  Portable exam 1711 hours compared to 10/18/2016 FINDINGS: Enlargement of cardiac silhouette. Mediastinal contours and pulmonary vascularity normal. Lungs clear. No pleural effusion or pneumothorax. Bones unremarkable. IMPRESSION: Minimal enlargement of cardiac silhouette without acute infiltrate. Electronically Signed   By: Lavonia Dana M.D.   On: 10/24/2016 17:33    Procedures Procedures (including critical care time)  Medications Ordered in ED Medications  furosemide (LASIX) injection 80 mg (not administered)     Initial Impression / Assessment and Plan / ED Course  I have reviewed the triage vital signs and the nursing notes.  Pertinent labs & imaging results that were  available during my care of the patient were reviewed by me and considered in my medical decision making (see chart for details).    Tiffany [name] is a 56 y.o. female here with worsening SOB. Recently admitted for pneumonia. Consider worsening pneumonia vs CHF exacerbation. Will get labs, lactate, cultures, BNP, CXR.    8:22 PM Initial lactate 2.4, went down to 1.8 with no intervention. CXR showed no pneumonia. WBC nl. BNP 22. I think likely mild diastolic dysfunction. Ordered 80 mg IV lasix. Since she didn't require more oxygen, I was thinking of add 40 mg extra daily for several days. Patient request admission for diuresis. Will consult family practice.   10:35 PM I talked to Dr. Emmaline Life, who knows her well. She has follow up in the clinic tomorrow. Given 80 mg IV lasix and urinated about 500 cc in the ED. Will add 40 mg in the afternoon for 5 days. Will get outpatient echo in the clinic. Stable for discharge.   Final Clinical Impressions(s) / ED Diagnoses   Final diagnoses:  None    New Prescriptions New Prescriptions   No medications on file     Drenda Freeze, MD 10/24/16 2236

## 2016-10-24 NOTE — ED Notes (Signed)
Abnormal CG-4 reported to Dr. Darl Householder

## 2016-10-25 ENCOUNTER — Ambulatory Visit (INDEPENDENT_AMBULATORY_CARE_PROVIDER_SITE_OTHER): Payer: Medicare HMO | Admitting: Family Medicine

## 2016-10-25 ENCOUNTER — Encounter: Payer: Self-pay | Admitting: Family Medicine

## 2016-10-25 DIAGNOSIS — R0603 Acute respiratory distress: Secondary | ICD-10-CM | POA: Diagnosis not present

## 2016-10-25 NOTE — Telephone Encounter (Signed)
Appointment scheduled for 10/15 at [place] at [facility]. Patient given appointment details at appointment today.

## 2016-10-25 NOTE — Assessment & Plan Note (Addendum)
Stable since last seen in ER yesterday.  Difficult problem as she has COPD, weight related restrictive lung disease and probable diastolic HF.   She has appts with pulmonology and tomorrow with her PCP.  Continue current diuretic regimen although I am not sure how much lasix actually adds.  Discussed weight loss is the main key that she can control.  Reports has not smoked since January

## 2016-10-25 NOTE — Patient Instructions (Addendum)
Good to see you today!  Thanks for coming in.   Keep the appointment with Dr Lamonte Sakai and orthopedist and restart rehab  Keep taking the diuretic as you are  Work on the diet to try to reduce your weight and then as you are more mobile that should be easier  See Dr Emmaline Life tomorrow

## 2016-10-25 NOTE — Progress Notes (Signed)
Subjective  Patient is presenting with the following illnesses  DYSPNEA Here to follow up from being seen in ER yesterday.  Feeling a little better.  No fever or increased edema or wheezing or chest pain.  Taking Torsemide 80 mg twice a day with Lasix 40 mg in middle of day as suggested by ER.  Urinating "a little more" than usual   Chief Complaint noted Review of Symptoms - see HPI PMH - Smoking status noted.     Objective Vital Signs reviewed Alert talking in normal sentences Pox - 97% at rest 88% when ambulating to door using her home O2 Lungs:  Normal respiratory effort, chest expands symmetrically. Lungs are clear to auscultation, no crackles or wheezes. Heart - Regular rate and rhythm.  No murmurs, gallops or rubs distant Extrem - left lower leg without pitting edema.  R leg in boot. Exam is limited due to her habitus     Assessments/Plans  No problem-specific Assessment & Plan notes found for this encounter.   See after visit summary for details of patient instuctions

## 2016-10-26 ENCOUNTER — Telehealth: Payer: Self-pay | Admitting: Internal Medicine

## 2016-10-26 ENCOUNTER — Ambulatory Visit (INDEPENDENT_AMBULATORY_CARE_PROVIDER_SITE_OTHER): Payer: Medicare HMO | Admitting: Internal Medicine

## 2016-10-26 ENCOUNTER — Encounter: Payer: Self-pay | Admitting: Internal Medicine

## 2016-10-26 VITALS — BP 110/58 | HR 71 | Temp 99.0°F | Wt 298.0 lb

## 2016-10-26 DIAGNOSIS — J441 Chronic obstructive pulmonary disease with (acute) exacerbation: Secondary | ICD-10-CM | POA: Diagnosis not present

## 2016-10-26 DIAGNOSIS — E1122 Type 2 diabetes mellitus with diabetic chronic kidney disease: Secondary | ICD-10-CM | POA: Diagnosis not present

## 2016-10-26 DIAGNOSIS — M19049 Primary osteoarthritis, unspecified hand: Secondary | ICD-10-CM

## 2016-10-26 DIAGNOSIS — E1140 Type 2 diabetes mellitus with diabetic neuropathy, unspecified: Secondary | ICD-10-CM | POA: Diagnosis not present

## 2016-10-26 DIAGNOSIS — I272 Pulmonary hypertension, unspecified: Secondary | ICD-10-CM | POA: Diagnosis not present

## 2016-10-26 DIAGNOSIS — S82201D Unspecified fracture of shaft of right tibia, subsequent encounter for closed fracture with routine healing: Secondary | ICD-10-CM | POA: Diagnosis not present

## 2016-10-26 DIAGNOSIS — M1990 Unspecified osteoarthritis, unspecified site: Secondary | ICD-10-CM | POA: Insufficient documentation

## 2016-10-26 DIAGNOSIS — R0603 Acute respiratory distress: Secondary | ICD-10-CM | POA: Diagnosis not present

## 2016-10-26 DIAGNOSIS — D631 Anemia in chronic kidney disease: Secondary | ICD-10-CM | POA: Diagnosis not present

## 2016-10-26 DIAGNOSIS — I5032 Chronic diastolic (congestive) heart failure: Secondary | ICD-10-CM

## 2016-10-26 DIAGNOSIS — I5030 Unspecified diastolic (congestive) heart failure: Secondary | ICD-10-CM | POA: Diagnosis not present

## 2016-10-26 DIAGNOSIS — N182 Chronic kidney disease, stage 2 (mild): Secondary | ICD-10-CM | POA: Diagnosis not present

## 2016-10-26 DIAGNOSIS — I130 Hypertensive heart and chronic kidney disease with heart failure and stage 1 through stage 4 chronic kidney disease, or unspecified chronic kidney disease: Secondary | ICD-10-CM | POA: Diagnosis not present

## 2016-10-26 MED ORDER — FUROSEMIDE 40 MG PO TABS: 40.0000 mg | ORAL_TABLET | Freq: Every day | ORAL | 0 refills | Status: DC

## 2016-10-26 MED ORDER — GUAIFENESIN ER 600 MG PO TB12: 600.0000 mg | ORAL_TABLET | Freq: Two times a day (BID) | ORAL | 0 refills | Status: DC

## 2016-10-26 MED ORDER — DICLOFENAC SODIUM 1 % TD GEL: 2.0000 g | Freq: Four times a day (QID) | TRANSDERMAL | 2 refills | Status: DC

## 2016-10-26 MED ORDER — TROLAMINE SALICYLATE 10 % EX LOTN: 1.0000 | TOPICAL_LOTION | Freq: Three times a day (TID) | CUTANEOUS | 2 refills | Status: DC | PRN

## 2016-10-26 NOTE — Patient Instructions (Addendum)
Please make an appointment for patient to be seen on Monday and Thursday (with me). Continue Toresemide 80 BID, plus lasix 40 mg midday. I will prescribe you volatren gel for your hands and shoulder

## 2016-10-26 NOTE — Telephone Encounter (Signed)
Pt called and would like the doctor to call in something else. The cream that was called in today is not covered by her insurance. jw

## 2016-10-26 NOTE — Progress Notes (Signed)
   [name] [name], MD Phone: [phone]  Reason For Visit: ED follow up, Dsypnea   # Dsypnea  - Stable home weighs over the past 4 days -  - Medications include 80 torsemide AM, midday 40 of lasix, 80 of torsemide  - Denies any worsening dyspnea  - No worsening swelling, increasing edema, abdominal swelling  - Denies any angina  - No severe cough, noticed a little mucus in her throat which she has had for a while, no increased sputum production - No increase in oxygen requirement   # Notes osteoarthritis has been acting up  - pain in hands and shoulder, use to take Naproxen  - however to protect kidney function was told to stop  Past Medical History Reviewed problem list.  Medications- reviewed and updated No additions to family history Social history- patient is a recently quit smoking  Objective: BP (!) 110/58   Pulse 71   Temp 99 F (37.2 C) (Oral)   Wt 298 lb (135.2 kg)   SpO2 99%   BMI 51.15 kg/m  Gen: NAD, alert, cooperative with exam, on home 6 L Cardio: regular rate and rhythm, S1S2 heard, no murmurs appreciated Pulm: clear to auscultation bilaterally, no wheezes, rhonchi or rales Extremities:  No edema noted in left leg, right leg with splint in place   Assessment/Plan: See problem based a/p  Chronic diastolic congestive heart failure (HCC) Dyspnea significantly improved per patient, no weight gain (home weight 294 lbs, weigh in the office 298lbs); severe lung disease currently on 6L, recent cardiac arrest with mulltiple visits to the hospital - believe anxiety over recent cardiac arrest the reason for patient's ED visits. Last seen on 10/9 in ED - for the next couple of weeks close outpatient follow up  - Repeat ECHO placed, though recently done in August  - Plans to follow up with cardiology - Current regiment included BID toresmide 80 mg + Lasix 40 mg  - Will need BMET recheck, weight recheck on Monday  - Follow up with  me on Thursday   Osteoarthritis Pain exacerbation  Will provide voltaren gel

## 2016-10-26 NOTE — Telephone Encounter (Signed)
Called in a different cream, hopeful that is covered

## 2016-10-29 LAB — CULTURE, BLOOD (ROUTINE X 2)
Culture: NO GROWTH
Special Requests: ADEQUATE

## 2016-10-29 NOTE — Assessment & Plan Note (Signed)
Pain exacerbation  Will provide voltaren gel

## 2016-10-29 NOTE — Assessment & Plan Note (Addendum)
Dyspnea significantly improved per patient, no weight gain (home weight 294 lbs, weigh in the office 298lbs); severe lung disease currently on 6L, recent cardiac arrest with mulltiple visits to the hospital - believe anxiety over recent cardiac arrest the reason for patient's ED visits. Last seen on 10/9 in ED - for the next couple of weeks close outpatient follow up  - Repeat ECHO placed, though recently done in August  - Plans to follow up with cardiology - Current regiment included BID toresmide 80 mg + Lasix 40 mg  - Will need BMET recheck, weight recheck on Monday  - Follow up with me on Thursday

## 2016-10-30 ENCOUNTER — Other Ambulatory Visit: Payer: Self-pay | Admitting: Internal Medicine

## 2016-10-30 ENCOUNTER — Other Ambulatory Visit: Payer: Self-pay

## 2016-10-30 ENCOUNTER — Ambulatory Visit (HOSPITAL_COMMUNITY): Payer: Medicare HMO | Attending: Cardiovascular Disease

## 2016-10-30 DIAGNOSIS — E1140 Type 2 diabetes mellitus with diabetic neuropathy, unspecified: Secondary | ICD-10-CM | POA: Diagnosis not present

## 2016-10-30 DIAGNOSIS — I5030 Unspecified diastolic (congestive) heart failure: Secondary | ICD-10-CM | POA: Diagnosis not present

## 2016-10-30 DIAGNOSIS — Z720 Tobacco use: Secondary | ICD-10-CM | POA: Diagnosis not present

## 2016-10-30 DIAGNOSIS — I5032 Chronic diastolic (congestive) heart failure: Secondary | ICD-10-CM

## 2016-10-30 DIAGNOSIS — N182 Chronic kidney disease, stage 2 (mild): Secondary | ICD-10-CM | POA: Diagnosis not present

## 2016-10-30 DIAGNOSIS — J441 Chronic obstructive pulmonary disease with (acute) exacerbation: Secondary | ICD-10-CM | POA: Diagnosis not present

## 2016-10-30 DIAGNOSIS — D631 Anemia in chronic kidney disease: Secondary | ICD-10-CM | POA: Diagnosis not present

## 2016-10-30 DIAGNOSIS — E119 Type 2 diabetes mellitus without complications: Secondary | ICD-10-CM | POA: Insufficient documentation

## 2016-10-30 DIAGNOSIS — E785 Hyperlipidemia, unspecified: Secondary | ICD-10-CM | POA: Diagnosis not present

## 2016-10-30 DIAGNOSIS — E1122 Type 2 diabetes mellitus with diabetic chronic kidney disease: Secondary | ICD-10-CM | POA: Diagnosis not present

## 2016-10-30 DIAGNOSIS — I130 Hypertensive heart and chronic kidney disease with heart failure and stage 1 through stage 4 chronic kidney disease, or unspecified chronic kidney disease: Secondary | ICD-10-CM | POA: Diagnosis not present

## 2016-10-30 DIAGNOSIS — Z6841 Body Mass Index (BMI) 40.0 and over, adult: Secondary | ICD-10-CM | POA: Insufficient documentation

## 2016-10-30 DIAGNOSIS — S82201D Unspecified fracture of shaft of right tibia, subsequent encounter for closed fracture with routine healing: Secondary | ICD-10-CM | POA: Diagnosis not present

## 2016-10-30 DIAGNOSIS — I110 Hypertensive heart disease with heart failure: Secondary | ICD-10-CM | POA: Diagnosis not present

## 2016-10-30 DIAGNOSIS — I272 Pulmonary hypertension, unspecified: Secondary | ICD-10-CM | POA: Diagnosis not present

## 2016-10-30 LAB — CULTURE, BLOOD (ROUTINE X 2)
Culture: NO GROWTH
Special Requests: ADEQUATE

## 2016-10-30 NOTE — Progress Notes (Signed)
Cardiology Office Note    Date:  10/31/2016   ID:  Tiffany [name], DOB 1960-07-06, MRN [id number]  PCP:  [name], MD  Cardiologist:  [name]  Chief Complaint: Hospital follow up   History of Present Illness:   Tiffany [name] is a 56 y.o. female with hx of asthma, COPD on chronic supplemental O2, chronic diastolic CHF, prior MI, HTN, hyperlipidemia, type 2 diabetes, history of cocaine abuse, CKD stage II, anemiaand anxiety presents for hospital follow up.   Last seen by me 07/04/16 for acute dCHF. Diuretics increased.   Patient was admitted 10/3-10/5 for acute on chronic respiratory failure 2nd likely pneumonia. CXR with possible early LLL bronchopneumonia. Patient was euvolemic. Echocardiogram showed normal LV function of 43-15%, grade 1 diastolic dysfunction, mildly thickened aortic valve leaflets without stenosis, elevated central venous pressure. BNP was normal.   Patient had a limited echo by PCP yesterday that showed normal LV function of 60-65% with moderate LVH.  Patient is here for follow-up. Patient states that she has gained 7 pounds in past one week and 3 pounds overnight. She has noted decline in her urine output. Recent continues to have lower extremity edema. No chest pain since discharge. Shortness of breath stable however worsens intermittently. Compliant with low sodium diet.  Past Medical History:  Diagnosis Date  . Anxiety   . Arthritis    "all over" (04/10/2016)  . Asthma   . Cardiac arrest ([place]) 09/08/2016   PEA  . CHF (congestive heart failure) ([place])   . Chronic bronchitis ([place])   . Chronic kidney disease    "I see a kidney dr." (04/10/2016)  . Cocaine abuse ([place])   . Complication of anesthesia    decreased bp, decreased heart rate  . Depression   . Disorder of nervous system   . Emphysema   . GERD (gastroesophageal reflux disease)   . Heart attack ([place]) 1980s  . History of blood transfusion 1994   "couldn't stop bleeding from my  period"  . Hyperlipidemia LDL goal <70   . Hypertension   . Incontinence   . Manic depression ([place])   . On home oxygen therapy    "6L; 24/7" (04/10/2016)  . OSA on CPAP    "wear mask sometimes" (04/10/2016)  . Paranoid ([place])    "sometimes; I'm on RX for it" (04/10/2016)  . Pneumonia    "I've had it several times; haven't had it since 06/2015" (04/10/2016)  . Schizophrenia ([place])   . Seasonal allergies   . Seizures ([place])    "don't know what kind; last one was ~ 1 yr ago" (04/10/2016)  . Sinus trouble   . Stroke [facility]) 1980s   denies residual on 04/10/2016  . Type II diabetes mellitus ([place])     Past Surgical History:  Procedure Laterality Date  . CESAREAN SECTION  1997  . HERNIA REPAIR    . IR CHOLANGIOGRAM EXISTING TUBE  07/20/2016  . IR PERC CHOLECYSTOSTOMY  05/10/2016  . IR RADIOLOGIST EVAL & MGMT  06/08/2016  . IR SINUS/FIST TUBE CHK-NON GI  07/12/2016  . TIBIA IM NAIL INSERTION Right 07/12/2016   Procedure: INTRAMEDULLARY (IM) NAIL RIGHT TIBIA;  Surgeon: Leandrew Koyanagi, MD;  Location: [facility];  Service: Orthopedics;  Laterality: Right;  . UMBILICAL HERNIA REPAIR  ~ 1963   "that's why I don't have a belly button"  . VAGINAL HYSTERECTOMY      Current Medications: Prior to Admission medications   Medication Sig  Start Date End Date Taking? Authorizing Provider  albuterol (PROVENTIL) (2.5 MG/3ML) 0.083% nebulizer solution Take 3 mLs (2.5 mg total) by nebulization every 6 (six) hours as needed for wheezing or shortness of breath. 10/16/16   Mikell, Jeani Sow, MD  amLODipine (NORVASC) 5 MG tablet Take 1 tablet (5 mg total) by mouth daily. 06/13/16   Archie Patten, MD  aspirin 81 MG EC tablet Take 1 tablet (81 mg total) by mouth daily. Swallow whole. 06/13/16   Archie Patten, MD  atorvastatin (LIPITOR) 40 MG tablet Take 1 tablet (40 mg total) by mouth daily at 6 PM. Patient taking differently: Take 80 mg by mouth 2 (two) times daily.  10/20/16   Mercy Riding, MD  divalproex ([name]  ER) 500 MG 24 hr tablet Take 3 tablets (1,500 mg total) by mouth at bedtime. 10/06/16 10/06/17  Arfeen, Arlyce Harman, MD  escitalopram (LEXAPRO) 20 MG tablet Take 1 tablet (20 mg total) by mouth daily. 10/06/16   Arfeen, Arlyce Harman, MD  fluticasone furoate-vilanterol (BREO ELLIPTA) 100-25 MCG/INH AEPB INHALE 1 PUFF EVERY DAY 10/16/16   Mikell, Jeani Sow, MD  furosemide (LASIX) 40 MG tablet Take 1 tablet (40 mg total) by mouth daily. 10/26/16   Mikell, Jeani Sow, MD  gabapentin (NEURONTIN) 600 MG tablet Take 1 tablet (600 mg total) by mouth 3 (three) times daily. 06/28/16   Archie Patten, MD  guaiFENesin (MUCINEX) 600 MG 12 hr tablet Take 1 tablet (600 mg total) by mouth 2 (two) times daily. 10/26/16   Mikell, Jeani Sow, MD  metFORMIN (GLUCOPHAGE-XR) 500 MG 24 hr tablet Take 1 tablet (500 mg total) by mouth 2 (two) times daily. 06/28/16   Archie Patten, MD  mirtazapine (REMERON SOL-TAB) 30 MG disintegrating tablet Take 1 tablet (30 mg total) by mouth at bedtime. 10/06/16 10/06/17  Arfeen, Arlyce Harman, MD  montelukast (SINGULAIR) 10 MG tablet Take 1 tablet (10 mg total) by mouth at bedtime. 06/13/16   Archie Patten, MD  Multiple Vitamin (MULTIVITAMIN WITH MINERALS) TABS tablet Take 1 tablet by mouth daily.    [provider]  omeprazole (PRILOSEC) 40 MG capsule Take 1 capsule (40 mg total) by mouth 2 (two) times daily. 08/23/16   Mikell, Jeani Sow, MD  OXYGEN Inhale 6 L into the lungs daily.    [provider]  potassium chloride SA (K-DUR,KLOR-CON) 20 MEQ tablet Take 1 tablet (20 mEq total) by mouth 2 (two) times daily. Patient taking differently: Take 50 mEq by mouth 2 (two) times daily.  09/21/16   Sela Hilding, MD  tiotropium (SPIRIVA HANDIHALER) 18 MCG inhalation capsule Place 1 capsule (18 mcg total) into inhaler and inhale daily. 06/13/16   Archie Patten, MD  torsemide (DEMADEX) 20 MG tablet Take 4 tablets (80 mg total) by mouth daily. Patient taking differently: Take 80  mg by mouth 2 (two) times daily.  10/18/16 01/16/17  Mikell, Jeani Sow, MD  Trolamine Salicylate 10 % LOTN Apply 1 patch topically 3 (three) times daily as needed. 10/26/16   Mikell, Jeani Sow, MD  VENTOLIN HFA 108 (90 Base) MCG/ACT inhaler Inhale 2 puffs into the lungs every 6 (six) hours as needed for wheezing or shortness of breath.  06/01/16   [provider]  ziprasidone (GEODON) 80 MG capsule Take 1 capsule (80 mg total) by mouth at bedtime. 10/06/16   Arfeen, Arlyce Harman, MD    Allergies:   Hydrocodone-acetaminophen; Hydroxyzine; Latuda [lurasidone hcl]; Codeine; and Sulfa antibiotics  Social History   Social History  . Marital status: Widowed    Spouse name: N/A  . Number of children: 3  . Years of education: N/A   Occupational History  . disabled     factory production   Social History Main Topics  . Smoking status: Former Smoker    Packs/day: 1.50    Years: 38.00    Types: Cigarettes    Start date: 03/13/1977    Quit date: 04/10/2016  . Smokeless tobacco: Never Used  . Alcohol use No  . Drug use: No     Comment: 04/10/2016 "last used cocaine back in November 2017"  . Sexual activity: No   Other Topics Concern  . None   Social History Narrative   Has 1 son, [name]   Lives with son and his boyfriend   Her house has ramps and handrails should she ever needs them.    Her mother lives down the street from her and is a good support person in addition to her son.   She drives herself, has private transportation.    Cocaine free since 02/24/16, smoke free since 04/10/16        Family History:  The patient's family history includes Anxiety disorder in her sister; Bipolar disorder in her sister; Cancer in her father and mother; Depression in her brother, mother, sister, and sister; Heart failure in her unknown relative; Schizophrenia in her sister.   ROS:   Please see the history of present illness.    ROS All other systems reviewed and are negative.   PHYSICAL  EXAM:   VS:  BP 130/60   Pulse 83   Ht 5\' 4"  (1.626 m)   Wt (!) 304 lb (137.9 kg)   BMI 52.18 kg/m    GEN: Morbidly obese female in no acute distress on supplemental oxygen HEENT: normal  Neck: no JVD, carotid bruits, or masses Cardiac:RRR; no murmurs, rubs, or gallops, 1+ BL LE edema  Respiratory:  clear to auscultation bilaterally, normal work of breathing GI: soft, nontender, distended, + BS MS: no deformity or atrophy  Skin: warm and dry, no rash Neuro:  Alert and Oriented x 3, Strength and sensation are intact Psych: euthymic mood, full affect  Wt Readings from Last 3 Encounters:  10/31/16 (!) 304 lb (137.9 kg)  10/31/16 (!) 304 lb (137.9 kg)  10/26/16 298 lb (135.2 kg)      Studies/Labs Reviewed:   EKG:  EKG is ordered today.  The ekg ordered today demonstrates Normal sinus rhythm with T-wave inversion in lead V3 and lateral leads.  Recent Labs: 03/15/2016: TSH 0.563 06/26/2016: NT-Pro BNP 57 10/07/2016: Magnesium 1.8 10/24/2016: ALT 13; B Natriuretic Peptide 22.0; BUN 22; Creatinine, Ser 1.35; Hemoglobin 9.0; Platelets 290; Potassium 3.7; Sodium 141   Lipid Panel    Component Value Date/Time   CHOL 113 07/10/2016 0352   TRIG 91 07/10/2016 0352   HDL 51 07/10/2016 0352   CHOLHDL 2.2 07/10/2016 0352   VLDL 18 07/10/2016 0352   LDLCALC 44 07/10/2016 0352   LDLDIRECT 52 04/05/2012 1210    Additional studies/ records that were reviewed today include:   Echocardiogram: 09/08/16 Study Conclusions  - Left ventricle: The cavity size was normal. There was moderate   concentric hypertrophy. Systolic function was normal. The   estimated ejection fraction was in the range of 60% to 65%. Wall   motion was normal; there were no regional wall motion   abnormalities. Doppler parameters are consistent with abnormal  left ventricular relaxation (grade 1 diastolic dysfunction). - Aortic valve: Trileaflet; mildly thickened leaflets.   Transvalvular velocity was minimally  increased. There was no   stenosis. Valve area (VTI): 1.66 cm^2. Valve area (Vmax): 1.77   cm^2. Valve area (Vmean): 1.67 cm^2. - Aortic root: The aortic root was normal in size. - Mitral valve: Structurally normal valve. There was trivial   regurgitation. - Left atrium: The atrium was normal in size. - Right ventricle: Systolic function was normal. - Right atrium: The atrium was normal in size. - Tricuspid valve: There was trivial regurgitation. - Pulmonic valve: There was no regurgitation. - Pulmonary arteries: Systolic pressure was within the normal   range. - Inferior vena cava: The vessel was dilated. The respirophasic   diameter changes were blunted (< 50%), consistent with elevated   central venous pressure. - Pericardium, extracardiac: There was no pericardial effusion.    ASSESSMENT & PLAN:    1. Acute on Chronic diastolic heart failure - Recent echocardiogram 08/2016 with preserved LV function and grade 1 diastolic dysfunction. Normal RV function. Repeat limited echocardiogram 10/15 showed normal LV function. She has gained 7 pounds in the past one week with 3 pounds overnight. Evidence of volume overload on exam however lungs clear. Suspect her symptoms likely related to pulmonary issues. She has normal RV function on echocardiogram. She takes torsemide 80 mg twice a day and Lasix 40 mg @ noon. Declining urine output. Check BMET oday. Discontinue Lasix. Add Zaroxolyn 2.5 mg Monday Wednesday Friday. May consider R sided cath if no improvement. BMET during follow up   2. Chronic respiratory failure/COPD - Shortness of breath and relatively stable on supplemental oxygen  3. CKD stage III - Baseline Scr 1.2-1.3. Scr was 1.35 on 10/24/16. Follow with diuresis.   4. Hypertension - Stable on current regimen.   Medication Adjustments/Labs and Tests Ordered: Current medicines are reviewed at length with the patient today.  Concerns regarding medicines are outlined above.   Medication changes, Labs and Tests ordered today are listed in the Patient Instructions below. Patient Instructions  Medication Instructions:  STOP Furosemide (Lasix) TAKE Torsemide (Demadex) 80 mg twice daily START Zaroxyln 2.5 mg on Mondays, Wednesdays, and Fridays - take 30 min before your Torsemide dose TAKE Additional 10 meq   Labwork: TODAY - basic metabolic panel   Testing/Procedures: None Ordered    Follow-Up: Your physician recommends that you keep your follow-up appointment on 10/25 with [name]   If you need a refill on your cardiac medications before your next appointment, please call your pharmacy.   Thank you for choosing [facility]! Christen Bame, RN [phone]       Jarrett Soho, [place]  10/31/2016 3:37 PM    [facility] [address], [place], [place]  [address] Phone: [phone]; Fax: [phone]

## 2016-10-31 ENCOUNTER — Ambulatory Visit (INDEPENDENT_AMBULATORY_CARE_PROVIDER_SITE_OTHER): Payer: Medicare HMO | Admitting: Physician Assistant

## 2016-10-31 ENCOUNTER — Encounter: Payer: Self-pay | Admitting: Physician Assistant

## 2016-10-31 ENCOUNTER — Ambulatory Visit (INDEPENDENT_AMBULATORY_CARE_PROVIDER_SITE_OTHER): Payer: Medicare HMO | Admitting: Family Medicine

## 2016-10-31 ENCOUNTER — Encounter: Payer: Self-pay | Admitting: Family Medicine

## 2016-10-31 VITALS — BP 126/72 | HR 85 | Temp 98.1°F | Ht 64.0 in | Wt 304.0 lb

## 2016-10-31 VITALS — BP 130/60 | HR 83 | Ht 64.0 in | Wt 304.0 lb

## 2016-10-31 DIAGNOSIS — I110 Hypertensive heart disease with heart failure: Secondary | ICD-10-CM | POA: Diagnosis not present

## 2016-10-31 DIAGNOSIS — I5032 Chronic diastolic (congestive) heart failure: Secondary | ICD-10-CM | POA: Diagnosis not present

## 2016-10-31 DIAGNOSIS — I509 Heart failure, unspecified: Secondary | ICD-10-CM

## 2016-10-31 DIAGNOSIS — N183 Chronic kidney disease, stage 3 unspecified: Secondary | ICD-10-CM

## 2016-10-31 DIAGNOSIS — I5033 Acute on chronic diastolic (congestive) heart failure: Secondary | ICD-10-CM

## 2016-10-31 MED ORDER — METOLAZONE 2.5 MG PO TABS: ORAL_TABLET | ORAL | 3 refills | Status: DC

## 2016-10-31 MED ORDER — POTASSIUM CHLORIDE ER 10 MEQ PO TBCR: EXTENDED_RELEASE_TABLET | ORAL | 3 refills | Status: DC

## 2016-10-31 MED ORDER — TORSEMIDE 20 MG PO TABS: 80.0000 mg | ORAL_TABLET | Freq: Two times a day (BID) | ORAL | 3 refills | Status: DC

## 2016-10-31 NOTE — Progress Notes (Signed)
Patient presented for acute on chronic heart failure follow-up. Patient had an appointment with cardiology across the street at 2:30 pm (appt approxiamtly 10 minutes after I walked into the room). Called cardiology office to inform them patient was at our clinic who request we send patient over at that time. Patient in agreement and was sent over immediatly for follow up. -- Harriet Butte, [place], PGY-2

## 2016-10-31 NOTE — Patient Instructions (Addendum)
Medication Instructions:  STOP Furosemide (Lasix) CONTINUE Torsemide (Demadex) 80 mg twice daily START Zaroxolyn (Metolazone) 2.5 mg on Mondays, Wednesdays, and Fridays - take 30 min before your Torsemide dose TAKE Kdur 10 meq on Mondays, Wednesdays, and Fridays in addition to your current dose  Labwork: TODAY - basic metabolic panel   Testing/Procedures: None Ordered    Follow-Up: Your physician recommends that you keep your follow-up appointment on 10/25 with [name]   If you need a refill on your cardiac medications before your next appointment, please call your pharmacy.   Thank you for choosing [facility]! Christen Bame, RN [phone]

## 2016-11-01 DIAGNOSIS — G4733 Obstructive sleep apnea (adult) (pediatric): Secondary | ICD-10-CM | POA: Diagnosis not present

## 2016-11-01 DIAGNOSIS — M6281 Muscle weakness (generalized): Secondary | ICD-10-CM | POA: Diagnosis not present

## 2016-11-01 DIAGNOSIS — R062 Wheezing: Secondary | ICD-10-CM | POA: Diagnosis not present

## 2016-11-01 DIAGNOSIS — J189 Pneumonia, unspecified organism: Secondary | ICD-10-CM | POA: Diagnosis not present

## 2016-11-01 DIAGNOSIS — J45909 Unspecified asthma, uncomplicated: Secondary | ICD-10-CM | POA: Diagnosis not present

## 2016-11-01 DIAGNOSIS — J440 Chronic obstructive pulmonary disease with acute lower respiratory infection: Secondary | ICD-10-CM | POA: Diagnosis not present

## 2016-11-01 DIAGNOSIS — R0602 Shortness of breath: Secondary | ICD-10-CM | POA: Diagnosis not present

## 2016-11-01 DIAGNOSIS — J449 Chronic obstructive pulmonary disease, unspecified: Secondary | ICD-10-CM | POA: Diagnosis not present

## 2016-11-01 DIAGNOSIS — I509 Heart failure, unspecified: Secondary | ICD-10-CM | POA: Diagnosis not present

## 2016-11-01 LAB — BASIC METABOLIC PANEL
BUN/Creatinine Ratio: 19 (ref 9–23)
BUN: 20 mg/dL (ref 6–24)
CO2: 33 mmol/L — ABNORMAL HIGH (ref 20–29)
Calcium: 9.6 mg/dL (ref 8.7–10.2)
Chloride: 98 mmol/L (ref 96–106)
Creatinine, Ser: 1.04 mg/dL — ABNORMAL HIGH (ref 0.57–1.00)
GFR calc Af Amer: 69 mL/min/{1.73_m2} (ref >59)
GFR calc non Af Amer: 60 mL/min/{1.73_m2} (ref >59)
Glucose: 122 mg/dL — ABNORMAL HIGH (ref 65–99)
Potassium: 3.2 mmol/L — ABNORMAL LOW (ref 3.5–5.2)
Sodium: 149 mmol/L — ABNORMAL HIGH (ref 134–144)

## 2016-11-02 ENCOUNTER — Ambulatory Visit (INDEPENDENT_AMBULATORY_CARE_PROVIDER_SITE_OTHER): Payer: Medicare HMO | Admitting: Internal Medicine

## 2016-11-02 ENCOUNTER — Encounter: Payer: Self-pay | Admitting: Internal Medicine

## 2016-11-02 ENCOUNTER — Telehealth: Payer: Self-pay

## 2016-11-02 VITALS — BP 130/78 | HR 77 | Temp 98.1°F | Resp 18 | Ht 64.17 in | Wt 303.0 lb

## 2016-11-02 DIAGNOSIS — R0603 Acute respiratory distress: Secondary | ICD-10-CM | POA: Diagnosis not present

## 2016-11-02 DIAGNOSIS — R0602 Shortness of breath: Secondary | ICD-10-CM | POA: Diagnosis not present

## 2016-11-02 NOTE — Telephone Encounter (Signed)
Pt is aware and agreeable to results. Per reviewing patients medication list with Robbie Lis noticed that patient is already taking 50 meq of Potassium BID and taking an extra 10 meq on Monday Wednesday and Friday so he recommended patient increasing to 60 meq of potassium BID for 2 days and then resuming normal dose. I instructed this to patient who asked that I leave the instructions on her mobile voicemail as she was driving and couldn't write the instructions down. I am agreeable to leaving medication instructions on voicemail.

## 2016-11-02 NOTE — Patient Instructions (Signed)
I think you on your way to improvement. Continue the torsemide 80 mg twice daily. I would take an extra [place] today, then continue as prescribed by cardiology. If your weight goes up over the weekend please take another metolazone. Follow up with me on Tuesday

## 2016-11-02 NOTE — Progress Notes (Signed)
   [name] [name], MD Phone: [phone]  Reason For Visit: Dsypnea   # Dyspnea  - Home scale showing patient has gained 6 lbs without clothes on  - Was by cardiology yesterday - placed on torsemide 80 mg BID + metolazone 2.5 mg - Monday, Wednesday, Friday,  - Has been diuresis well on the new medication  - Has lost 1 lbs since yesterday with diuresis - Patient states that her breathing is good  - Patient has swelling in her legs and states that her stomach feels okay   Past Medical History Reviewed problem list.  Medications- reviewed and updated No additions to family history Social history- patient is a non-smoker  Objective: BP 130/78 (BP Location: Left Arm, Patient Position: Sitting, Cuff Size: Normal)   Pulse 77   Temp 98.1 F (36.7 C) (Oral)   Resp 18   Ht 5' 4.17" (1.63 m)   Wt (!) 303 lb (137.4 kg)   SpO2 94%   BMI 51.73 kg/m  Gen: NAD, alert, cooperative with exam Cardio: regular rate and rhythm, S1S2 heard, no murmurs appreciated Pulm: clear to auscultation bilaterally, no wheezes, rhonchi or rales Extremities: warm, well perfused, 1+  pitting edema to left leg   Assessment/Plan: See problem based a/p  Dyspnea Patient is following up for dyspnea; have been following the patient twice weekly - Weight up slightly - 6 lbs weight gain on home scale  -Seen by cardiology yesterday placed on torsemide 80 mg twice daily with metolazone 2.5 mg -Noted to have pitting edema in left leg 1+; crackles noted, no JVD -Plan will add metolazone on x1 Thursday and Friday, torsemide 80 mg twice daily -Patient develops dyspnea over the weekend or increasing weight gain will take another metolazone otherwise follow per cardiology cardiology recommendations-Monday, Wednesday and Friday -Follow-up next Thursday

## 2016-11-03 ENCOUNTER — Telehealth: Payer: Self-pay | Admitting: Cardiology

## 2016-11-03 ENCOUNTER — Other Ambulatory Visit: Payer: Self-pay

## 2016-11-03 ENCOUNTER — Emergency Department (HOSPITAL_COMMUNITY): Payer: Medicare HMO

## 2016-11-03 ENCOUNTER — Observation Stay (HOSPITAL_COMMUNITY)
Admission: EM | Admit: 2016-11-03 | Discharge: 2016-11-05 | Disposition: A | Discharge status: Home / Self Care | Payer: Medicare HMO | Attending: Family Medicine | Admitting: Family Medicine

## 2016-11-03 DIAGNOSIS — I5032 Chronic diastolic (congestive) heart failure: Secondary | ICD-10-CM | POA: Diagnosis not present

## 2016-11-03 DIAGNOSIS — R7989 Other specified abnormal findings of blood chemistry: Secondary | ICD-10-CM | POA: Diagnosis not present

## 2016-11-03 DIAGNOSIS — Z87891 Personal history of nicotine dependence: Secondary | ICD-10-CM | POA: Diagnosis not present

## 2016-11-03 DIAGNOSIS — Z79899 Other long term (current) drug therapy: Secondary | ICD-10-CM | POA: Diagnosis not present

## 2016-11-03 DIAGNOSIS — I272 Pulmonary hypertension, unspecified: Secondary | ICD-10-CM | POA: Diagnosis not present

## 2016-11-03 DIAGNOSIS — E1140 Type 2 diabetes mellitus with diabetic neuropathy, unspecified: Secondary | ICD-10-CM | POA: Insufficient documentation

## 2016-11-03 DIAGNOSIS — I5030 Unspecified diastolic (congestive) heart failure: Secondary | ICD-10-CM | POA: Diagnosis not present

## 2016-11-03 DIAGNOSIS — R071 Chest pain on breathing: Secondary | ICD-10-CM | POA: Diagnosis not present

## 2016-11-03 DIAGNOSIS — N189 Chronic kidney disease, unspecified: Secondary | ICD-10-CM | POA: Insufficient documentation

## 2016-11-03 DIAGNOSIS — Z7984 Long term (current) use of oral hypoglycemic drugs: Secondary | ICD-10-CM | POA: Diagnosis not present

## 2016-11-03 DIAGNOSIS — J449 Chronic obstructive pulmonary disease, unspecified: Secondary | ICD-10-CM | POA: Insufficient documentation

## 2016-11-03 DIAGNOSIS — R079 Chest pain, unspecified: Secondary | ICD-10-CM | POA: Diagnosis not present

## 2016-11-03 DIAGNOSIS — J441 Chronic obstructive pulmonary disease with (acute) exacerbation: Secondary | ICD-10-CM | POA: Diagnosis not present

## 2016-11-03 DIAGNOSIS — N179 Acute kidney failure, unspecified: Secondary | ICD-10-CM | POA: Diagnosis not present

## 2016-11-03 DIAGNOSIS — I509 Heart failure, unspecified: Secondary | ICD-10-CM

## 2016-11-03 DIAGNOSIS — G4733 Obstructive sleep apnea (adult) (pediatric): Secondary | ICD-10-CM | POA: Diagnosis not present

## 2016-11-03 DIAGNOSIS — E876 Hypokalemia: Secondary | ICD-10-CM | POA: Diagnosis not present

## 2016-11-03 DIAGNOSIS — E1122 Type 2 diabetes mellitus with diabetic chronic kidney disease: Secondary | ICD-10-CM | POA: Diagnosis not present

## 2016-11-03 DIAGNOSIS — N182 Chronic kidney disease, stage 2 (mild): Secondary | ICD-10-CM | POA: Diagnosis not present

## 2016-11-03 DIAGNOSIS — I130 Hypertensive heart and chronic kidney disease with heart failure and stage 1 through stage 4 chronic kidney disease, or unspecified chronic kidney disease: Secondary | ICD-10-CM | POA: Diagnosis not present

## 2016-11-03 DIAGNOSIS — D631 Anemia in chronic kidney disease: Secondary | ICD-10-CM | POA: Diagnosis not present

## 2016-11-03 DIAGNOSIS — Z7982 Long term (current) use of aspirin: Secondary | ICD-10-CM | POA: Diagnosis not present

## 2016-11-03 DIAGNOSIS — S82201D Unspecified fracture of shaft of right tibia, subsequent encounter for closed fracture with routine healing: Secondary | ICD-10-CM | POA: Diagnosis not present

## 2016-11-03 DIAGNOSIS — E785 Hyperlipidemia, unspecified: Secondary | ICD-10-CM | POA: Insufficient documentation

## 2016-11-03 DIAGNOSIS — R0602 Shortness of breath: Secondary | ICD-10-CM | POA: Diagnosis present

## 2016-11-03 LAB — COMPREHENSIVE METABOLIC PANEL
ALT: 17 U/L (ref 14–54)
AST: 19 U/L (ref 15–41)
Albumin: 3.4 g/dL — ABNORMAL LOW (ref 3.5–5.0)
Alkaline Phosphatase: 154 U/L — ABNORMAL HIGH (ref 38–126)
Anion gap: 11 (ref 5–15)
BUN: 20 mg/dL (ref 6–20)
CO2: 42 mmol/L — ABNORMAL HIGH (ref 22–32)
Calcium: 9.5 mg/dL (ref 8.9–10.3)
Chloride: 89 mmol/L — ABNORMAL LOW (ref 101–111)
Creatinine, Ser: 1.57 mg/dL — ABNORMAL HIGH (ref 0.44–1.00)
GFR calc Af Amer: 41 mL/min — ABNORMAL LOW (ref >60)
GFR calc non Af Amer: 36 mL/min — ABNORMAL LOW (ref >60)
Glucose, Bld: 108 mg/dL — ABNORMAL HIGH (ref 65–99)
Potassium: 2.8 mmol/L — ABNORMAL LOW (ref 3.5–5.1)
Sodium: 142 mmol/L (ref 135–145)
Total Bilirubin: 0.3 mg/dL (ref 0.3–1.2)
Total Protein: 7 g/dL (ref 6.5–8.1)

## 2016-11-03 LAB — CBC
HCT: 32.7 % — ABNORMAL LOW (ref 36.0–46.0)
Hemoglobin: 9.7 g/dL — ABNORMAL LOW (ref 12.0–15.0)
MCH: 28.7 pg (ref 26.0–34.0)
MCHC: 29.7 g/dL — ABNORMAL LOW (ref 30.0–36.0)
MCV: 96.7 fL (ref 78.0–100.0)
Platelets: 322 10*3/uL (ref 150–400)
RBC: 3.38 MIL/uL — ABNORMAL LOW (ref 3.87–5.11)
RDW: 15.8 % — ABNORMAL HIGH (ref 11.5–15.5)
WBC: 8.4 10*3/uL (ref 4.0–10.5)

## 2016-11-03 LAB — GLUCOSE, CAPILLARY: Glucose-Capillary: 138 mg/dL — ABNORMAL HIGH (ref 65–99)

## 2016-11-03 LAB — BRAIN NATRIURETIC PEPTIDE: B Natriuretic Peptide: 12 pg/mL (ref 0.0–100.0)

## 2016-11-03 LAB — CBG MONITORING, ED: Glucose-Capillary: 123 mg/dL — ABNORMAL HIGH (ref 65–99)

## 2016-11-03 LAB — TROPONIN I
Troponin I: <0.03 ng/mL (ref <0.03)
Troponin I: <0.03 ng/mL (ref <0.03)

## 2016-11-03 LAB — D-DIMER, QUANTITATIVE: D-Dimer, Quant: 0.79 ug/mL-FEU — ABNORMAL HIGH (ref 0.00–0.50)

## 2016-11-03 LAB — HEMOGLOBIN A1C
Hgb A1c MFr Bld: 5.5 % (ref 4.8–5.6)
Mean Plasma Glucose: 111.15 mg/dL

## 2016-11-03 LAB — I-STAT TROPONIN, ED: Troponin i, poc: 0.01 ng/mL (ref 0.00–0.08)

## 2016-11-03 MED ORDER — ALBUTEROL SULFATE (2.5 MG/3ML) 0.083% IN NEBU: 2.5000 mg | INHALATION_SOLUTION | Freq: Four times a day (QID) | RESPIRATORY_TRACT | Status: DC | PRN

## 2016-11-03 MED ORDER — IOPAMIDOL (ISOVUE-370) INJECTION 76%
80.0000 mL | Freq: Once | INTRAVENOUS | Status: AC | PRN
  Administered 2016-11-03: 100 mL via INTRAVENOUS

## 2016-11-03 MED ORDER — ACETAMINOPHEN 325 MG PO TABS
650.0000 mg | ORAL_TABLET | Freq: Four times a day (QID) | ORAL | Status: DC | PRN
  Administered 2016-11-04 – 2016-11-05 (×2): 650 mg via ORAL
  Filled 2016-11-03 (×2): qty 2

## 2016-11-03 MED ORDER — TIOTROPIUM BROMIDE MONOHYDRATE 18 MCG IN CAPS
18.0000 ug | ORAL_CAPSULE | Freq: Every day | RESPIRATORY_TRACT | Status: DC
  Filled 2016-11-03: qty 5

## 2016-11-03 MED ORDER — FENTANYL CITRATE (PF) 100 MCG/2ML IJ SOLN: 50.0000 ug | Freq: Once | INTRAMUSCULAR | Status: DC

## 2016-11-03 MED ORDER — ENOXAPARIN SODIUM 30 MG/0.3ML ~~LOC~~ SOLN
30.0000 mg | SUBCUTANEOUS | Status: DC
  Administered 2016-11-04: 30 mg via SUBCUTANEOUS
  Filled 2016-11-03: qty 0.3

## 2016-11-03 MED ORDER — POTASSIUM CHLORIDE ER 10 MEQ PO TBCR: 10.0000 meq | EXTENDED_RELEASE_TABLET | Freq: Every day | ORAL | Status: DC

## 2016-11-03 MED ORDER — GABAPENTIN 600 MG PO TABS
600.0000 mg | ORAL_TABLET | Freq: Three times a day (TID) | ORAL | Status: DC
  Administered 2016-11-04 – 2016-11-05 (×6): 600 mg via ORAL
  Filled 2016-11-03 (×6): qty 1

## 2016-11-03 MED ORDER — ESCITALOPRAM OXALATE 10 MG PO TABS
20.0000 mg | ORAL_TABLET | Freq: Every day | ORAL | Status: DC
  Administered 2016-11-04 – 2016-11-05 (×2): 20 mg via ORAL
  Filled 2016-11-03 (×2): qty 2

## 2016-11-03 MED ORDER — ASPIRIN EC 81 MG PO TBEC
81.0000 mg | DELAYED_RELEASE_TABLET | Freq: Every day | ORAL | Status: DC
  Administered 2016-11-04 – 2016-11-05 (×2): 81 mg via ORAL
  Filled 2016-11-03 (×2): qty 1

## 2016-11-03 MED ORDER — ATORVASTATIN CALCIUM 80 MG PO TABS
80.0000 mg | ORAL_TABLET | Freq: Every day | ORAL | Status: DC
  Administered 2016-11-04 – 2016-11-05 (×2): 80 mg via ORAL
  Filled 2016-11-03 (×2): qty 1

## 2016-11-03 MED ORDER — INSULIN ASPART 100 UNIT/ML ~~LOC~~ SOLN
0.0000 [IU] | Freq: Three times a day (TID) | SUBCUTANEOUS | Status: DC
  Administered 2016-11-04: 2 [IU] via SUBCUTANEOUS
  Administered 2016-11-05: 1 [IU] via SUBCUTANEOUS

## 2016-11-03 MED ORDER — DIVALPROEX SODIUM ER 500 MG PO TB24
1500.0000 mg | ORAL_TABLET | Freq: Every day | ORAL | Status: DC
  Administered 2016-11-04 (×2): 1500 mg via ORAL
  Filled 2016-11-03 (×3): qty 3

## 2016-11-03 MED ORDER — TORSEMIDE 20 MG PO TABS
80.0000 mg | ORAL_TABLET | Freq: Two times a day (BID) | ORAL | Status: DC
  Administered 2016-11-04 – 2016-11-05 (×3): 80 mg via ORAL
  Filled 2016-11-03 (×3): qty 4

## 2016-11-03 MED ORDER — POTASSIUM CHLORIDE CRYS ER 20 MEQ PO TBCR
40.0000 meq | EXTENDED_RELEASE_TABLET | Freq: Once | ORAL | Status: AC
  Administered 2016-11-03: 40 meq via ORAL
  Filled 2016-11-03: qty 2

## 2016-11-03 MED ORDER — ZIPRASIDONE HCL 80 MG PO CAPS
80.0000 mg | ORAL_CAPSULE | Freq: Every day | ORAL | Status: DC
  Administered 2016-11-04 (×2): 80 mg via ORAL
  Filled 2016-11-03 (×3): qty 1

## 2016-11-03 MED ORDER — SODIUM CHLORIDE 0.9 % IV SOLN: INTRAVENOUS | Status: DC

## 2016-11-03 MED ORDER — ACETAMINOPHEN 650 MG RE SUPP: 650.0000 mg | Freq: Four times a day (QID) | RECTAL | Status: DC | PRN

## 2016-11-03 MED ORDER — AMLODIPINE BESYLATE 5 MG PO TABS
5.0000 mg | ORAL_TABLET | Freq: Every day | ORAL | Status: DC
  Administered 2016-11-04 – 2016-11-05 (×2): 5 mg via ORAL
  Filled 2016-11-03 (×2): qty 1

## 2016-11-03 MED ORDER — MAGNESIUM SULFATE 2 GM/50ML IV SOLN
2.0000 g | Freq: Once | INTRAVENOUS | Status: AC
  Administered 2016-11-03: 2 g via INTRAVENOUS
  Filled 2016-11-03: qty 50

## 2016-11-03 MED ORDER — MIRTAZAPINE 30 MG PO TBDP
30.0000 mg | ORAL_TABLET | Freq: Every day | ORAL | Status: DC
  Administered 2016-11-04 (×2): 30 mg via ORAL
  Filled 2016-11-03 (×3): qty 1

## 2016-11-03 MED ORDER — LACTATED RINGERS IV BOLUS (SEPSIS): 500.0000 mL | Freq: Once | INTRAVENOUS | Status: DC

## 2016-11-03 MED ORDER — PANTOPRAZOLE SODIUM 40 MG PO TBEC
80.0000 mg | DELAYED_RELEASE_TABLET | Freq: Every day | ORAL | Status: DC
  Administered 2016-11-04 – 2016-11-05 (×3): 80 mg via ORAL
  Filled 2016-11-03 (×3): qty 2

## 2016-11-03 MED ORDER — POTASSIUM CHLORIDE 10 MEQ/100ML IV SOLN: 10.0000 meq | Freq: Once | INTRAVENOUS | Status: DC

## 2016-11-03 MED ORDER — MONTELUKAST SODIUM 10 MG PO TABS
10.0000 mg | ORAL_TABLET | Freq: Every day | ORAL | Status: DC
  Administered 2016-11-04 (×2): 10 mg via ORAL
  Filled 2016-11-03 (×2): qty 1

## 2016-11-03 MED ORDER — LACTATED RINGERS IV BOLUS (SEPSIS)
250.0000 mL | Freq: Once | INTRAVENOUS | Status: AC
  Administered 2016-11-03: 250 mL via INTRAVENOUS

## 2016-11-03 MED ORDER — FLUTICASONE FUROATE-VILANTEROL 100-25 MCG/INH IN AEPB
1.0000 | INHALATION_SPRAY | Freq: Every day | RESPIRATORY_TRACT | Status: DC
  Administered 2016-11-04 – 2016-11-05 (×2): 1 via RESPIRATORY_TRACT
  Filled 2016-11-03: qty 28

## 2016-11-03 MED ORDER — POTASSIUM CHLORIDE CRYS ER 20 MEQ PO TBCR
50.0000 meq | EXTENDED_RELEASE_TABLET | Freq: Two times a day (BID) | ORAL | Status: DC
  Administered 2016-11-04 – 2016-11-05 (×4): 50 meq via ORAL
  Filled 2016-11-03 (×5): qty 1

## 2016-11-03 MED ORDER — ADULT MULTIVITAMIN W/MINERALS CH
1.0000 | ORAL_TABLET | Freq: Every day | ORAL | Status: DC
  Administered 2016-11-04 – 2016-11-05 (×2): 1 via ORAL
  Filled 2016-11-03 (×3): qty 1

## 2016-11-03 MED ORDER — IOPAMIDOL (ISOVUE-370) INJECTION 76%
INTRAVENOUS | Status: AC
  Filled 2016-11-03: qty 100

## 2016-11-03 NOTE — Discharge Summary (Signed)
[facility] Discharge Summary  Patient name: Tiffany [name] Medical record number: [id number] Date of birth: [birth year] Age: 56 y.o. Gender: female Date of Admission: 11/03/2016  Date of Discharge: 11/05/16 Admitting Physician: Zenia Resides, MD  Primary Care Provider: [name], MD Consultants: None  Indication for Hospitalization: Chest pain  Discharge Diagnoses/Problem List:  Patient Active Problem List   Diagnosis Date Noted  . Hypokalemia   . Chronic congestive heart failure ([place])   . Chest pain 11/03/2016  . Osteoarthritis 10/26/2016  . Bronchopneumonia 10/19/2016  . Shortness of breath 10/18/2016  . Asthma exacerbation 10/07/2016  . Dyspnea 10/07/2016  . Acute on chronic congestive heart failure ([place])   . Acute left ankle pain   . Acute on chronic systolic congestive heart failure ([place])   . Cardiac arrest ([place]) 09/08/2016  . History of open reduction and internal fixation (ORIF) procedure   . Closed displaced transverse fracture of shaft of right tibia   . Fall   . Overactive bladder 06/07/2016  . QT prolongation   . OSA and COPD overlap syndrome ([place])   . Generalized weakness   . Arthritis   . Bipolar I disorder ([place])   . Acute heart failure ([place])   . Essential hypertension 03/22/2016  . Bipolar affective disorder, mixed, severe, with psychotic behavior ([place]) 11/28/2015  . Cocaine use disorder, severe, dependence ([place]) 11/28/2015  . AKI (acute kidney injury) ([place]) 11/24/2015  . Chronic diastolic congestive heart failure ([place])   . Chronic respiratory failure with hypoxia and hypercapnia ([place]) 06/22/2015  . Acute respiratory failure with hypoxia and hypercapnia ([place]) 06/22/2015  . Onychomycosis 01/21/2015  . Tobacco use disorder 07/22/2014  . COPD (chronic obstructive pulmonary disease) ([place]) 07/08/2014  . COPD exacerbation ([place]) 06/26/2014  . Knee pain, bilateral 01/22/2013  . Seizure ([place]) 01/04/2013  . Health care  maintenance 11/25/2012  . History of kidney stones 06/18/2012  . Chronic pain syndrome 06/18/2012  . Allergic reaction 04/07/2012  . HSV infection 08/30/2011  . Dyslipidemia 04/24/2011  . Anemia 04/24/2011  . Diabetic neuropathy ([place]) 04/24/2011  . Obstructive sleep apnea 10/18/2010  . Asthma 10/18/2010  . Morbid obesity ([place]) 10/18/2010  . Type 2 diabetes mellitus ([place]) 10/18/2010   Disposition: Home  Discharge Condition: Stable  Discharge Exam: General: sleeping comfortably upon entering the room wearing Trilogy mask, in NAD Cardiovascular: RRR, no murmurs appreciated Respiratory: Trilogy mask in place; lungs CTAB, no wheezes or rhales Abdomen: soft, non-tender, non-distended, +BS Extremities: trace pitting edema to shin bilaterally Neuro: A&Ox3 Psych: appropriate mood and affect *exam performed by resident on duty 10/21  Brief Hospital Course:  Patient presented to chest soreness. Had been present since receiving chest compressions about two months prior, however had worsened. Pain had resolved by time of ED presentation, however given patient's extensive cardiac history, she was admitted for further work-up and observation. Patient also reporting SOB, raising concern for PE. In ED, D-dimer found to be positive, so CTA ordered, which showed no PE. BNP was WNL, making heart failure exacerbation less likely cause. EKG and istat troponin were negative as well. Pain was reproducible upon palpation, and patient with fairly recent history of compressions, so MSK etiology deemed most likely cause of chest soreness.  Patient also noted to have hypokalemia and AKI on admission. Metalozone had recently been added to her med regimen in addition to torsemide, so overdiuresis was suspected as cause of these abnormalities. Metalozone was held during admission and potassium was replaced.  Patient remained without pain for duration of admission. Subsequently trended troponins were also negative.  Potassium normalized, and Cr improved. As such, patient was deemed stable for discharge home.   Issues for Follow Up:  1. Hypokalemia: Possibly due to significant diuresis, consider f/u labs to verify potassium status.  Patient was sent home on 75meq daily pending their FM followup. 2. Fluid status:  Patient has narrow safety margin with CHF and recent AKI during hospitalization, current plan was to hold matalazone unless weight was >305.   Please discuss what you consider to be the optimal plan for her.  Significant Procedures: None  Significant Labs and Imaging:   Recent Labs Lab 11/03/16 1523  WBC 8.4  HGB 9.7*  HCT 32.7*  PLT 322    Recent Labs Lab 11/03/16 1523 11/04/16 0658 11/04/16 1507 11/05/16 0603 11/05/16 1631  NA 142 139 140 143 141  K 2.8* 2.9* 3.5 3.0* 3.3*  CL 89* 86* 87* 89* 89*  CO2 42* 36* 39* 41* 39*  GLUCOSE 108* 105* 122* 100* 116*  BUN 20 25* 25* 29* 31*  CREATININE 1.57* 1.50* 1.63* 1.58* 1.72*  CALCIUM 9.5 9.1 9.9 9.7 9.7  ALKPHOS 154*  --   --   --   --   AST 19  --   --   --   --   ALT 17  --   --   --   --   ALBUMIN 3.4*  --   --   --   --     Ct Angio Chest Pe W Or Wo Contrast  Result Date: 11/03/2016 CLINICAL DATA:  Shortness of breath and elevated D-dimer. Chest pain. EXAM: CT ANGIOGRAPHY CHEST WITH CONTRAST TECHNIQUE: Multidetector CT imaging of the chest was performed using the standard protocol during bolus administration of intravenous contrast. Multiplanar CT image reconstructions and MIPs were obtained to evaluate the vascular anatomy. CONTRAST:  75 mL Isovue 370 COMPARISON:  Chest CTA 10/07/2016 FINDINGS: Cardiovascular: Contrast injection is sufficient to demonstrate satisfactory opacification of the pulmonary arteries to the segmental level. There is no pulmonary embolus. The main pulmonary artery is mildly enlarged measuring 3.4 cm. There is no CT evidence of acute right heart strain. The visualized aorta is normal. There is a normal  3-vessel arch branching pattern. Heart size is normal, without pericardial effusion. Mediastinum/Nodes: No mediastinal, hilar or axillary lymphadenopathy. The visualized thyroid and thoracic esophageal course are unremarkable. Lungs/Pleura: No pulmonary nodules or masses. No pleural effusion or pneumothorax. No focal airspace consolidation. No focal pleural abnormality. Upper Abdomen: Contrast bolus timing is not optimized for evaluation of the abdominal organs. Within this limitation, the visualized organs of the upper abdomen are normal. Musculoskeletal: Multiple old, healed left anterior rib fractures. Review of the MIP images confirms the above findings. IMPRESSION: 1. No pulmonary embolus. 2. No other acute thoracic abnormality. 3. Mildly enlarged main pulmonary artery may indicate underlying pulmonary hypertension. Electronically Signed   By: Ulyses Jarred M.D.   On: 11/03/2016 19:25   Dg Chest Port 1 View  Result Date: 11/03/2016 CLINICAL DATA:  SOB that began this morning. Pt has recent hx of cardiac arrest and is undergoing rehab at this time. Pt has non-radiating cp under the left breast that is tender to palpation. EXAM: PORTABLE CHEST 1 VIEW COMPARISON:  10/24/2016 FINDINGS: The heart size and mediastinal contours are within normal limits. Both lungs are clear. The visualized skeletal structures are unremarkable. IMPRESSION: No active disease. Electronically Signed   By: Elbert Ewings  Patel   On: 11/03/2016 15:12     Results/Tests Pending at Time of Discharge:   Discharge Medications:  Allergies as of 11/05/2016      Reactions   Hydrocodone-acetaminophen Shortness Of Breath   Hydroxyzine Anaphylaxis, Shortness Of Breath   Latuda [lurasidone Hcl] Anaphylaxis   Codeine Nausea And Vomiting   Sulfa Antibiotics Itching      Medication List    STOP taking these medications   potassium chloride SA 20 MEQ tablet Commonly known as:  K-DUR,KLOR-CON     TAKE these medications   albuterol (2.5  MG/3ML) 0.083% nebulizer solution Commonly known as:  PROVENTIL Take 3 mLs (2.5 mg total) by nebulization every 6 (six) hours as needed for wheezing or shortness of breath.   amLODipine 5 MG tablet Commonly known as:  NORVASC Take 1 tablet (5 mg total) by mouth daily.   aspirin 81 MG EC tablet Take 1 tablet (81 mg total) by mouth daily. Swallow whole.   atorvastatin 80 MG tablet Commonly known as:  LIPITOR Take 80 mg by mouth 2 (two) times daily.   divalproex 500 MG 24 hr tablet Commonly known as:  [name] ER Take 3 tablets (1,500 mg total) by mouth at bedtime.   escitalopram 20 MG tablet Commonly known as:  LEXAPRO Take 1 tablet (20 mg total) by mouth daily.   fluticasone furoate-vilanterol 100-25 MCG/INH Aepb Commonly known as:  BREO ELLIPTA INHALE 1 PUFF EVERY DAY   gabapentin 600 MG tablet Commonly known as:  NEURONTIN Take 1 tablet (600 mg total) by mouth 3 (three) times daily.   guaiFENesin 600 MG 12 hr tablet Commonly known as:  MUCINEX Take 1 tablet (600 mg total) by mouth 2 (two) times daily.   metFORMIN 500 MG 24 hr tablet Commonly known as:  GLUCOPHAGE-XR Take 1 tablet (500 mg total) by mouth 2 (two) times daily.   metolazone 2.5 MG tablet Commonly known as:  ZAROXOLYN Take 1 tablet 3 days per week (Mondays, Wednesdays, and Fridays). Take 30 minutes before taking Torsemide   mirtazapine 30 MG disintegrating tablet Commonly known as:  REMERON SOL-TAB Take 1 tablet (30 mg total) by mouth at bedtime.   montelukast 10 MG tablet Commonly known as:  SINGULAIR Take 1 tablet (10 mg total) by mouth at bedtime.   multivitamin with minerals Tabs tablet Take 1 tablet by mouth daily.   omeprazole 40 MG capsule Commonly known as:  PRILOSEC Take 1 capsule (40 mg total) by mouth 2 (two) times daily.   OXYGEN Inhale 6 L into the lungs daily.   potassium chloride 10 MEQ tablet Commonly known as:  K-DUR Take 5 tablets (50 mEq total) by mouth daily. What  changed:  how much to take  how to take this  when to take this  additional instructions   tiotropium 18 MCG inhalation capsule Commonly known as:  SPIRIVA HANDIHALER Place 1 capsule (18 mcg total) into inhaler and inhale daily.   torsemide 20 MG tablet Commonly known as:  DEMADEX Take 4 tablets (80 mg total) by mouth 2 (two) times daily.   Trolamine Salicylate 10 % Lotn Apply 1 patch topically 3 (three) times daily as needed.   ziprasidone 80 MG capsule Commonly known as:  GEODON Take 1 capsule (80 mg total) by mouth at bedtime.       Discharge Instructions: Please refer to Patient Instructions section of EMR for full details.  Patient was counseled important signs and symptoms that should prompt return to medical  care, changes in medications, dietary instructions, activity restrictions, and follow up appointments.   Follow-Up Appointments:   Sherene Sires, DO 11/06/2016, 5:53 AM PGY-1, [facility]

## 2016-11-03 NOTE — ED Notes (Signed)
Attempted to call report approximately 30 minutes ago however RN had not been assigned to the pt.  Floor given this writer's direct number.  Will attempt again.

## 2016-11-03 NOTE — Telephone Encounter (Signed)
New Message     Pt c/o of Chest Pain: STAT if CP now or developed within 24 hours  1. Are you having CP right now? yes  2. Are you experiencing any other symptoms (ex. SOB, nausea, vomiting, sweating)? Sob   3. How long have you been experiencing CP? This morning   4. Is your CP continuous or coming and going? constant   5. Have you taken Nitroglycerin? none ?

## 2016-11-03 NOTE — ED Provider Notes (Signed)
[facility] EMERGENCY DEPARTMENT Provider Note   CSN: [id number] Arrival date & time: 11/03/16  1423     History   Chief Complaint Chief Complaint  Patient presents with  . Shortness of Breath    HPI Tiffany [name] is a 56 y.o. female.  HPI   56 year old female with extensive past medical history as below including hypertension, hyperlipidemia, CKD, recent cardiac arrest in September who presents with chest pain and shortness of breath.  The patient has been seen multiple times recently at her cardiologist and primary physician.  She was seen just this week and her Lasix was increased at her cardiology appointment due to recent weight gain.  She has lost 1 pound at her PCP visit yesterday.  However, over the last 24 hours, the patient states she has now gained several additional pounds despite taking her increased dose of diuretics.  She has had associated sharp, pleuritic left-sided chest pain.  Of note, she has a right ankle brace in place due to recent injury.  [name] bearing weight on the ankle, however.  Patient states the pain is worse with deep inspiration.  Denies any alleviating factors.  She has had associated orthopnea, worsening acute on chronic shortness of breath, as well as bilateral leg swelling.  Denies any history of blood clots.  [name] on aspirin but no other blood thinners.  Past Medical History:  Diagnosis Date  . Anxiety   . Arthritis    "all over" (04/10/2016)  . Asthma   . Cardiac arrest ([place]) 09/08/2016   PEA  . CHF (congestive heart failure) ([place])   . Chronic bronchitis ([place])   . Chronic kidney disease    "I see a kidney dr." (04/10/2016)  . Cocaine abuse ([place])   . Complication of anesthesia    decreased bp, decreased heart rate  . Depression   . Disorder of nervous system   . Emphysema   . GERD (gastroesophageal reflux disease)   . Heart attack ([place]) 1980s  . History of blood transfusion 1994   "couldn't stop bleeding from my  period"  . Hyperlipidemia LDL goal <70   . Hypertension   . Incontinence   . Manic depression ([place])   . On home oxygen therapy    "6L; 24/7" (04/10/2016)  . OSA on CPAP    "wear mask sometimes" (04/10/2016)  . Paranoid ([place])    "sometimes; I'm on RX for it" (04/10/2016)  . Pneumonia    "I've had it several times; haven't had it since 06/2015" (04/10/2016)  . Schizophrenia ([place])   . Seasonal allergies   . Seizures ([place])    "don't know what kind; last one was ~ 1 yr ago" (04/10/2016)  . Sinus trouble   . Stroke [facility]) 1980s   denies residual on 04/10/2016  . Type II diabetes mellitus [facility])     Patient Active Problem List   Diagnosis Date Noted  . Osteoarthritis 10/26/2016  . Bronchopneumonia 10/19/2016  . Shortness of breath 10/18/2016  . Asthma exacerbation 10/07/2016  . Dyspnea 10/07/2016  . Acute on chronic congestive heart failure ([place])   . Acute left ankle pain   . Acute on chronic systolic congestive heart failure ([place])   . Cardiac arrest ([place]) 09/08/2016  . History of open reduction and internal fixation (ORIF) procedure   . Closed displaced transverse fracture of shaft of right tibia   . Fall   . Overactive bladder 06/07/2016  . QT prolongation   .  OSA and COPD overlap syndrome ([place])   . Generalized weakness   . Arthritis   . Bipolar I disorder ([place])   . Acute heart failure ([place])   . Essential hypertension 03/22/2016  . Bipolar affective disorder, mixed, severe, with psychotic behavior ([place]) 11/28/2015  . Cocaine use disorder, severe, dependence ([place]) 11/28/2015  . AKI (acute kidney injury) ([place]) 11/24/2015  . Chronic diastolic congestive heart failure ([place])   . Chronic respiratory failure with hypoxia and hypercapnia ([place]) 06/22/2015  . Acute respiratory failure with hypoxia and hypercapnia ([place]) 06/22/2015  . Onychomycosis 01/21/2015  . Tobacco use disorder 07/22/2014  . COPD (chronic obstructive pulmonary disease) ([place]) 07/08/2014  . COPD exacerbation ([place])  06/26/2014  . Knee pain, bilateral 01/22/2013  . Seizure ([place]) 01/04/2013  . Health care maintenance 11/25/2012  . History of kidney stones 06/18/2012  . Chronic pain syndrome 06/18/2012  . Allergic reaction 04/07/2012  . HSV infection 08/30/2011  . Dyslipidemia 04/24/2011  . Anemia 04/24/2011  . Diabetic neuropathy ([place]) 04/24/2011  . Obstructive sleep apnea 10/18/2010  . Asthma 10/18/2010  . Morbid obesity ([place]) 10/18/2010  . Type 2 diabetes mellitus ([place]) 10/18/2010    Past Surgical History:  Procedure Laterality Date  . CESAREAN SECTION  1997  . HERNIA REPAIR    . IR CHOLANGIOGRAM EXISTING TUBE  07/20/2016  . IR PERC CHOLECYSTOSTOMY  05/10/2016  . IR RADIOLOGIST EVAL & MGMT  06/08/2016  . IR SINUS/FIST TUBE CHK-NON GI  07/12/2016  . TIBIA IM NAIL INSERTION Right 07/12/2016   Procedure: INTRAMEDULLARY (IM) NAIL RIGHT TIBIA;  Surgeon: Leandrew Koyanagi, MD;  Location: [facility];  Service: Orthopedics;  Laterality: Right;  . UMBILICAL HERNIA REPAIR  ~ 1963   "that's why I don't have a belly button"  . VAGINAL HYSTERECTOMY      OB History    No data available       Home Medications    Prior to Admission medications   Medication Sig Start Date End Date Taking? Authorizing Provider  albuterol (PROVENTIL) (2.5 MG/3ML) 0.083% nebulizer solution Take 3 mLs (2.5 mg total) by nebulization every 6 (six) hours as needed for wheezing or shortness of breath. 10/16/16  Yes Mikell, Jeani Sow, MD  amLODipine (NORVASC) 5 MG tablet Take 1 tablet (5 mg total) by mouth daily. 06/13/16  Yes Archie Patten, MD  aspirin 81 MG EC tablet Take 1 tablet (81 mg total) by mouth daily. Swallow whole. 06/13/16  Yes Archie Patten, MD  atorvastatin (LIPITOR) 80 MG tablet Take 80 mg by mouth 2 (two) times daily.   Yes [provider]  divalproex ([name] ER) 500 MG 24 hr tablet Take 3 tablets (1,500 mg total) by mouth at bedtime. 10/06/16 10/06/17 Yes Arfeen, Arlyce Harman, MD  escitalopram (LEXAPRO) 20 MG  tablet Take 1 tablet (20 mg total) by mouth daily. 10/06/16  Yes Arfeen, Arlyce Harman, MD  fluticasone furoate-vilanterol (BREO ELLIPTA) 100-25 MCG/INH AEPB INHALE 1 PUFF EVERY DAY 10/16/16  Yes Mikell, Jeani Sow, MD  gabapentin (NEURONTIN) 600 MG tablet Take 1 tablet (600 mg total) by mouth 3 (three) times daily. 06/28/16  Yes Archie Patten, MD  guaiFENesin (MUCINEX) 600 MG 12 hr tablet Take 1 tablet (600 mg total) by mouth 2 (two) times daily. 10/26/16  Yes Mikell, Jeani Sow, MD  metFORMIN (GLUCOPHAGE-XR) 500 MG 24 hr tablet Take 1 tablet (500 mg total) by mouth 2 (two) times daily. 06/28/16  Yes Archie Patten, MD  metolazone (ZAROXOLYN)  2.5 MG tablet Take 1 tablet 3 days per week (Mondays, Wednesdays, and Fridays). Take 30 minutes before taking Torsemide 10/31/16  Yes Bhagat, Bhavinkumar, PA  mirtazapine (REMERON SOL-TAB) 30 MG disintegrating tablet Take 1 tablet (30 mg total) by mouth at bedtime. 10/06/16 10/06/17 Yes Arfeen, Arlyce Harman, MD  montelukast (SINGULAIR) 10 MG tablet Take 1 tablet (10 mg total) by mouth at bedtime. 06/13/16  Yes Archie Patten, MD  Multiple Vitamin (MULTIVITAMIN WITH MINERALS) TABS tablet Take 1 tablet by mouth daily.   Yes [provider]  omeprazole (PRILOSEC) 40 MG capsule Take 1 capsule (40 mg total) by mouth 2 (two) times daily. 08/23/16  Yes [name], MD  potassium chloride (K-DUR) 10 MEQ tablet Take 1 tablet on Mondays, Wednesday, and Fridays in addition to 50 meq twice daily 10/31/16  Yes Bhagat, Bhavinkumar, PA  potassium chloride SA (K-DUR,KLOR-CON) 20 MEQ tablet Take 50 mEq by mouth 2 (two) times daily.   Yes [provider]  tiotropium (SPIRIVA HANDIHALER) 18 MCG inhalation capsule Place 1 capsule (18 mcg total) into inhaler and inhale daily. 06/13/16  Yes Archie Patten, MD  torsemide (DEMADEX) 20 MG tablet Take 4 tablets (80 mg total) by mouth 2 (two) times daily. 10/31/16  Yes Bhagat, Bhavinkumar, PA  Trolamine Salicylate 10 %  LOTN Apply 1 patch topically 3 (three) times daily as needed. 10/26/16  Yes Mikell, Jeani Sow, MD  ziprasidone (GEODON) 80 MG capsule Take 1 capsule (80 mg total) by mouth at bedtime. 10/06/16  Yes Arfeen, Arlyce Harman, MD  OXYGEN Inhale 6 L into the lungs daily.    [provider]    Family History Family History  Problem Relation Age of Onset  . Cancer Father        prostate  . Cancer Mother        lung  . Depression Mother   . Depression Sister   . Anxiety disorder Sister   . Schizophrenia Sister   . Bipolar disorder Sister   . Depression Sister   . Depression Brother   . Heart failure Unknown        cousin    Social History Social History  Substance Use Topics  . Smoking status: Former Smoker    Packs/day: 1.50    Years: 38.00    Types: Cigarettes    Start date: 03/13/1977    Quit date: 04/10/2016  . Smokeless tobacco: Never Used  . Alcohol use No     Allergies   Hydrocodone-acetaminophen; Hydroxyzine; Latuda [lurasidone hcl]; Codeine; and Sulfa antibiotics   Review of Systems Review of Systems  Constitutional: Positive for fatigue.  Respiratory: Positive for cough and shortness of breath.   Cardiovascular: Positive for chest pain and leg swelling.  Neurological: Positive for weakness.  All other systems reviewed and are negative.    Physical Exam Updated Vital Signs BP 125/75 (BP Location: Right Arm)   Pulse 82   Temp 98.3 F (36.8 C) (Oral)   Resp 10   Ht 5\' 5"  (1.651 m)   Wt (!) 137.4 kg (303 lb)   SpO2 96%   BMI 50.42 kg/m   Physical Exam  Constitutional: [name] oriented to person, place, and time. She appears well-developed and well-nourished. No distress.  On oxygen via nasal cannula, in no distress  HENT:  Head: Normocephalic and atraumatic.  Eyes: Conjunctivae are normal.  Neck: Neck supple.  Cardiovascular: Normal rate, regular rhythm and normal heart sounds.  Exam reveals no friction  rub.   No murmur heard. Pulmonary/Chest:  Effort normal. No respiratory distress. She has no wheezes. She has rales (Bibasilar). She exhibits tenderness (Tenderness to palpation over anterior, midline chest wall.).  Abdominal: Soft. She exhibits no distension.  Musculoskeletal: She exhibits no edema.  Neurological: [name] alert and oriented to person, place, and time. She exhibits normal muscle tone.  Skin: Skin is warm. Capillary refill takes less than 2 seconds.  Psychiatric: She has a normal mood and affect.  Nursing note and vitals reviewed.    ED Treatments / Results  Labs (all labs ordered are listed, but only abnormal results are displayed) Labs Reviewed  CBC - Abnormal; Notable for the following:       Result Value   RBC 3.38 (*)    Hemoglobin 9.7 (*)    HCT 32.7 (*)    MCHC 29.7 (*)    RDW 15.8 (*)    All other components within normal limits  COMPREHENSIVE METABOLIC PANEL - Abnormal; Notable for the following:    Potassium 2.8 (*)    Chloride 89 (*)    CO2 42 (*)    Glucose, Bld 108 (*)    Creatinine, Ser 1.57 (*)    Albumin 3.4 (*)    Alkaline Phosphatase 154 (*)    GFR calc non Af Amer 36 (*)    GFR calc Af Amer 41 (*)    All other components within normal limits  D-DIMER, QUANTITATIVE (NOT AT [facility]) - Abnormal; Notable for the following:    D-Dimer, Quant 0.79 (*)    All other components within normal limits  BRAIN NATRIURETIC PEPTIDE  TROPONIN I  I-STAT TROPONIN, ED    EKG  EKG Interpretation  Date/Time:  Friday November 03 2016 14:30:30 EDT Ventricular Rate:  82 PR Interval:    QRS Duration: 82 QT Interval:  505 QTC Calculation: 590 R Axis:   59 Text Interpretation:  Sinus rhythm Borderline T wave abnormalities Prolonged QT interval No significant change since last tracing Confirmed by Lajean Saver [phone]) on 11/03/2016 2:33:40 PM       Radiology Dg Chest Port 1 View  Result Date: 11/03/2016 CLINICAL DATA:  SOB that began this morning. Pt has recent hx of cardiac arrest and is  undergoing rehab at this time. Pt has non-radiating cp under the left breast that is tender to palpation. EXAM: PORTABLE CHEST 1 VIEW COMPARISON:  10/24/2016 FINDINGS: The heart size and mediastinal contours are within normal limits. Both lungs are clear. The visualized skeletal structures are unremarkable. IMPRESSION: No active disease. Electronically Signed   By: Kathreen Devoid   On: 11/03/2016 15:12    Procedures Procedures (including critical care time)  Medications Ordered in ED Medications  fentaNYL (SUBLIMAZE) injection 50 mcg (not administered)  potassium chloride 10 mEq in 100 mL IVPB (not administered)  potassium chloride SA (K-DUR,KLOR-CON) CR tablet 40 mEq (not administered)  magnesium sulfate IVPB 2 g 50 mL (not administered)  lactated ringers bolus 250 mL (not administered)     Initial Impression / Assessment and Plan / ED Course  I have reviewed the triage vital signs and the nursing notes.  Pertinent labs & imaging results that were available during my care of the patient were reviewed by me and considered in my medical decision making (see chart for details).     56 yo F with extensive PMHx including dCHF, HTN, HLD, OSA, CKD, OSA here with left-sided, pleuritic CP. S/p recent admission for PNA. On arrival, pt  mildly hypervolemic but in no resp distress. She has some chest wall TTP but this does not correlate with her area of reported pain. Will check labs, including D-Dimer given recent ortho injury, recurrent hospitalizations now with pleuritic pain. Pt in agreement.  Labs show marked hypokalemia, contraction alkalosis and AKI. Likely 2/2 her recent heavy diuresis. Will replete, give potassium/mag. D-Dimer positive. BNP negative. Given that CP does not appear related to CHF, will check CT Angio, give 250 cc fluids for contrast load/contraction, and admit for high risk CP evaluation given known h/o CAD.   This note was prepared with assistance of TEFL teacher. Occasional wrong-word or sound-a-like substitutions may have occurred due to the inherent limitations of voice recognition software.   Final Clinical Impressions(s) / ED Diagnoses   Final diagnoses:  Chest pain on breathing  Hypokalemia  AKI (acute kidney injury) ([place])    New Prescriptions New Prescriptions   No medications on file     Duffy Bruce, MD 11/03/16 1801

## 2016-11-03 NOTE — H&P (Signed)
[facility] Admission History and Physical Service Pager: [phone]  Patient name: Tiffany [name] Medical record number: [id number] Date of birth: 1960-04-28 Age: 56 y.o. Gender: female  Primary Care Provider: [name], MD Consultants: None Code Status: FULL CODE  Chief Complaint: chest pain  Assessment and Plan: Tiffany [name] is a 56 y.o. female presenting with chest pain. PMH is significant for asthma/COPD (6L home O2 daytime, "ventilator"QHS), CKDII, T2DM, HFpEF, HLD, HTN, Bipolar 1 disorder, paranoid schizophrenia, Hx of cocaine abuse  Chest pain: Differential includes PE, cardiac etiology, CHF exacerbation, MSK etiology. States that pain was improved with taking 4 ASA. Less likely cardiac etiology, as troponin neg, EKG with no significant changes from prior, and pain reproducible on exam. CHF exacerbation also less likely, as patient's weight down and BNP WNL. Concern for PE given reported SOB and elevated D-dimer 0.79, however no PE noted on CTA. CXR with no active cardiopulmonary disease. Pain reproducible on exam, making MSK higher on differential particularly given recent hx of chest compressions. Pain now resolved except upon palpation.  - Obs to [facility], attending Dr. Andria Frames - Telemetry - Tylenol PRN - Trend troponin  Hypokalemia: Possibly due to overdiuresis. K 2.8 on admission. Was 3.2 on 10/16. Prescribed KDur 50 mEq BID at home.  - 83mEq K given in ED - Will give regularly nightly PO 50 mEq KDur tonight  - AMP BMP - replace K as indicated  AKI: Likely due to overdiuresis. Cr 1.57 on admission. Baseline ~1.2. Drinking well, particularly drinking more than recommended 2L fluid restriction, so doubt dehydration 2/2 poor PO intake.  - Given HFpEF and increased weight, will hold IVF for now - PO fluid intake, though within 2L fluid restriction for CHF - Holding metolazone  - AM BMP  HFpEF: Last echo 10/30/16 with EF 60-65%. On  torsemide and recently added metolazone at home. Weight appears to have gone up 5lbs from [facility] clinic appt with Dr. Emmaline Life on 10/11. Mild LE pitting edema on exam suggesting some fluid overload, however patient with elevated Cr and hypokalemia likely due to overdiuresis, so do not want to continue to heavily diurese.  -Continue home torsemide   - Hold home metolazone - Fluid restricted diet (2L)  - Holding IVF  COPD: Fairly well-controlled on current regimen. Satting well on home O2 requirement and lungs CTAB, so doubt COPD exacerbation as cause of symptoms.  - Cotinue home Spiriva, Breo, Singulair, will bring in home trilogy machine for sleeping - Albuterol neg PRN - Continue home O2 - Pulse ox with vitals  HTN: Normotensive on admission.  - Continue home amlodipine  Type II DM: Last A1C 5.6 on 06/20/218, however patient recently reporting elevated blood sugar at home. On metformin 500mg  BID at home. CBG 108 on presentation.  - Hold metformin - Continue home gabapentin for nephropathy - Repeat A1C - CBG qACHS - SSI  OSA:  - Son to bring home trilogy (patient prefers to use her own)  Bipolar disorder:  - Continue home Lexapro, [name], Geodon, Remeron  Closed fracture of right tibia: Followed by Dr. Erlinda Hong on 10/12/16, currently in boot, no new complaints. - No new intervention  HLD: - Continue home atorvastatin  FEN/GI: regular diet with 2L fluid restriction Prophylaxis: Lovenox renal dose  Disposition: Place in observation, chest pain rule out  History of Present Illness:  Tiffany [name] is a 56 y.o. female presenting with chest pain.  Patient reports dull ache "under chest where heart  is" when she woke up this AM. Rates 4/10. Pain worse with expiration and palpation; no worsening with movement. No radiation of pain. Noticed soreness in center of chest last night, however has been sore since compressions performed during prior admission. Soreness only present with palpation.  Pain has now resolved after taking regular daily ASA as well as chewing three additional ASA per cards recommendation prior to ED presentation. Also reports some breathing difficulties. Has been breathing hard and shallow. Haven't felt like getting up because more difficulty breathing. Denies diaphoresis, nausea. Endorses abdominal distention and "tightness." Denies constipation, with last BM today.  Has not missed any doses of diuretics. Has been drinking more than recommended amount of fluids daily (drinking closer to 3.5L than 2L as recommended).   Review Of Systems: Per HPI with the following additions:   Review of Systems  Constitutional: Negative for diaphoresis.  Eyes: Negative for blurred vision and double vision.  Respiratory: Positive for shortness of breath.   Cardiovascular: Positive for chest pain. Negative for palpitations.  Gastrointestinal: Negative for constipation, nausea and vomiting.       Abdominal "tightness"  Genitourinary: Negative for dysuria.  Neurological: Negative for dizziness, tingling, loss of consciousness and headaches.    Patient Active Problem List   Diagnosis Date Noted  . Chest pain 11/03/2016  . Osteoarthritis 10/26/2016  . Bronchopneumonia 10/19/2016  . Shortness of breath 10/18/2016  . Asthma exacerbation 10/07/2016  . Dyspnea 10/07/2016  . Acute on chronic congestive heart failure ([place])   . Acute left ankle pain   . Acute on chronic systolic congestive heart failure ([place])   . Cardiac arrest ([place]) 09/08/2016  . History of open reduction and internal fixation (ORIF) procedure   . Closed displaced transverse fracture of shaft of right tibia   . Fall   . Overactive bladder 06/07/2016  . QT prolongation   . OSA and COPD overlap syndrome ([place])   . Generalized weakness   . Arthritis   . Bipolar I disorder ([place])   . Acute heart failure ([place])   . Essential hypertension 03/22/2016  . Bipolar affective disorder, mixed, severe, with psychotic  behavior ([place]) 11/28/2015  . Cocaine use disorder, severe, dependence ([place]) 11/28/2015  . AKI (acute kidney injury) ([place]) 11/24/2015  . Chronic diastolic congestive heart failure ([place])   . Chronic respiratory failure with hypoxia and hypercapnia ([place]) 06/22/2015  . Acute respiratory failure with hypoxia and hypercapnia ([place]) 06/22/2015  . Onychomycosis 01/21/2015  . Tobacco use disorder 07/22/2014  . COPD (chronic obstructive pulmonary disease) ([place]) 07/08/2014  . COPD exacerbation ([place]) 06/26/2014  . Knee pain, bilateral 01/22/2013  . Seizure ([place]) 01/04/2013  . Health care maintenance 11/25/2012  . History of kidney stones 06/18/2012  . Chronic pain syndrome 06/18/2012  . Allergic reaction 04/07/2012  . HSV infection 08/30/2011  . Dyslipidemia 04/24/2011  . Anemia 04/24/2011  . Diabetic neuropathy ([place]) 04/24/2011  . Obstructive sleep apnea 10/18/2010  . Asthma 10/18/2010  . Morbid obesity ([place]) 10/18/2010  . Type 2 diabetes mellitus ([place]) 10/18/2010    Past Medical History: Past Medical History:  Diagnosis Date  . Anxiety   . Arthritis    "all over" (04/10/2016)  . Asthma   . Cardiac arrest ([place]) 09/08/2016   PEA  . CHF (congestive heart failure) ([place])   . Chronic bronchitis ([place])   . Chronic kidney disease    "I see a kidney dr." (04/10/2016)  . Cocaine abuse ([place])   . Complication of anesthesia  decreased bp, decreased heart rate  . Depression   . Disorder of nervous system   . Emphysema   . GERD (gastroesophageal reflux disease)   . Heart attack ([place]) 1980s  . History of blood transfusion 1994   "couldn't stop bleeding from my period"  . Hyperlipidemia LDL goal <70   . Hypertension   . Incontinence   . Manic depression ([place])   . On home oxygen therapy    "6L; 24/7" (04/10/2016)  . OSA on CPAP    "wear mask sometimes" (04/10/2016)  . Paranoid ([place])    "sometimes; I'm on RX for it" (04/10/2016)  . Pneumonia    "I've had it several times; haven't had it  since 06/2015" (04/10/2016)  . Schizophrenia ([place])   . Seasonal allergies   . Seizures ([place])    "don't know what kind; last one was ~ 1 yr ago" (04/10/2016)  . Sinus trouble   . Stroke [facility]) 1980s   denies residual on 04/10/2016  . Type II diabetes mellitus ([place])     Past Surgical History: Past Surgical History:  Procedure Laterality Date  . CESAREAN SECTION  1997  . HERNIA REPAIR    . IR CHOLANGIOGRAM EXISTING TUBE  07/20/2016  . IR PERC CHOLECYSTOSTOMY  05/10/2016  . IR RADIOLOGIST EVAL & MGMT  06/08/2016  . IR SINUS/FIST TUBE CHK-NON GI  07/12/2016  . TIBIA IM NAIL INSERTION Right 07/12/2016   Procedure: INTRAMEDULLARY (IM) NAIL RIGHT TIBIA;  Surgeon: Leandrew Koyanagi, MD;  Location: [facility];  Service: Orthopedics;  Laterality: Right;  . UMBILICAL HERNIA REPAIR  ~ 1963   "that's why I don't have a belly button"  . VAGINAL HYSTERECTOMY      Social History: Social History  Substance Use Topics  . Smoking status: Former Smoker    Packs/day: 1.50    Years: 38.00    Types: Cigarettes    Start date: 03/13/1977    Quit date: 04/10/2016  . Smokeless tobacco: Never Used  . Alcohol use No   Additional social history: Lives at home with her son and her son's friend.   Please also refer to relevant sections of EMR.  Family History: Family History  Problem Relation Age of Onset  . Cancer Father        prostate  . Cancer Mother        lung  . Depression Mother   . Depression Sister   . Anxiety disorder Sister   . Schizophrenia Sister   . Bipolar disorder Sister   . Depression Sister   . Depression Brother   . Heart failure Unknown        cousin   Allergies and Medications: Allergies  Allergen Reactions  . Hydrocodone-Acetaminophen Shortness Of Breath  . Hydroxyzine Anaphylaxis and Shortness Of Breath  . Latuda [Lurasidone Hcl] Anaphylaxis  . Codeine Nausea And Vomiting  . Sulfa Antibiotics Itching   Current Facility-Administered Medications on File Prior to Encounter   Medication Dose Route Frequency Provider Last Rate Last Dose  . iopamidol (ISOVUE-370) 76 % injection            Current Outpatient Prescriptions on File Prior to Encounter  Medication Sig Dispense Refill  . albuterol (PROVENTIL) (2.5 MG/3ML) 0.083% nebulizer solution Take 3 mLs (2.5 mg total) by nebulization every 6 (six) hours as needed for wheezing or shortness of breath. 150 mL 1  . amLODipine (NORVASC) 5 MG tablet Take 1 tablet (5 mg total) by mouth daily. 90 tablet 0  .  aspirin 81 MG EC tablet Take 1 tablet (81 mg total) by mouth daily. Swallow whole. 90 tablet 0  . atorvastatin (LIPITOR) 80 MG tablet Take 80 mg by mouth 2 (two) times daily.    . divalproex ([name] ER) 500 MG 24 hr tablet Take 3 tablets (1,500 mg total) by mouth at bedtime. 90 tablet 1  . escitalopram (LEXAPRO) 20 MG tablet Take 1 tablet (20 mg total) by mouth daily. 30 tablet 1  . fluticasone furoate-vilanterol (BREO ELLIPTA) 100-25 MCG/INH AEPB INHALE 1 PUFF EVERY DAY 180 each 0  . gabapentin (NEURONTIN) 600 MG tablet Take 1 tablet (600 mg total) by mouth 3 (three) times daily. 270 tablet 0  . guaiFENesin (MUCINEX) 600 MG 12 hr tablet Take 1 tablet (600 mg total) by mouth 2 (two) times daily. 30 tablet 0  . metFORMIN (GLUCOPHAGE-XR) 500 MG 24 hr tablet Take 1 tablet (500 mg total) by mouth 2 (two) times daily. 180 tablet 0  . metolazone (ZAROXOLYN) 2.5 MG tablet Take 1 tablet 3 days per week (Mondays, Wednesdays, and Fridays). Take 30 minutes before taking Torsemide 45 tablet 3  . mirtazapine (REMERON SOL-TAB) 30 MG disintegrating tablet Take 1 tablet (30 mg total) by mouth at bedtime. 30 tablet 1  . montelukast (SINGULAIR) 10 MG tablet Take 1 tablet (10 mg total) by mouth at bedtime. 90 tablet 1  . Multiple Vitamin (MULTIVITAMIN WITH MINERALS) TABS tablet Take 1 tablet by mouth daily.    [name] omeprazole (PRILOSEC) 40 MG capsule Take 1 capsule (40 mg total) by mouth 2 (two) times daily. 180 capsule 0  . potassium  chloride (K-DUR) 10 MEQ tablet Take 1 tablet on Mondays, Wednesday, and Fridays in addition to 50 meq twice daily 45 tablet 3  . potassium chloride SA (K-DUR,KLOR-CON) 20 MEQ tablet Take 50 mEq by mouth 2 (two) times daily.    [name] tiotropium (SPIRIVA HANDIHALER) 18 MCG inhalation capsule Place 1 capsule (18 mcg total) into inhaler and inhale daily. 90 capsule 1  . torsemide (DEMADEX) 20 MG tablet Take 4 tablets (80 mg total) by mouth 2 (two) times daily. 720 tablet 3  . Trolamine Salicylate 10 % LOTN Apply 1 patch topically 3 (three) times daily as needed. 141 g 2  . ziprasidone (GEODON) 80 MG capsule Take 1 capsule (80 mg total) by mouth at bedtime. 30 capsule 1  . OXYGEN Inhale 6 L into the lungs daily.      Objective: BP 125/74   Pulse 78   Temp 98.3 F (36.8 C) (Oral)   Resp 20   Ht 5\' 5"  (1.651 m)   Wt (!) 303 lb (137.4 kg)   SpO2 98%   BMI 50.42 kg/m  Exam: General: obese, reclining comfortably on 6 L o2 in ED bed Eyes: EOMI, PERRL, no conjunctival injections ENTM: no signs of symptoms of trauma bilateral ears, nose, head Neck: no cervical lymphadenopathy, no difficulty with rotation of neck Cardiovascular: regular rate and rhythm, systolic murmur Respiratory: CTA bilaterally, mild chest pain on inspiration, no tachypnea, slightly increased work of breathing, on 6L [place] Gastrointestinal: soft, non-tender, non-distended. Bowel sounds present.   Obese MSK: able to move all four extremities, 1+pitting edema on L leg, R leg in boot s/p tib/fib fracture.   Specifically reproducible chest pain on palpation in the 2 specific areas she said hurt (sternal, and left side anterior axillary under breast Derm: warm, dry, well perfused Neuro: 5/5 strength BLE, BUE.  Psych: appropriate  Labs and Imaging: CBC  BMET   Recent Labs Lab 11/03/16 1523  WBC 8.4  HGB 9.7*  HCT 32.7*  PLT 322    Recent Labs Lab 11/03/16 1523  NA 142  K 2.8*  CL 89*  CO2 42*  BUN 20  CREATININE 1.57*   GLUCOSE 108*  CALCIUM 9.5       Sherene Sires, DO 11/03/2016, 8:09 PM PGY-1, [place] Intern pager: [phone], text pages welcome  UPPER LEVEL ADDENDUM  I have read the above note and made revisions highlighted in orange.  Adin Hector, MD, MPH PGY-3 [place] [facility] Pager [phone]

## 2016-11-03 NOTE — Progress Notes (Signed)
Family Medicine Teaching Service Daily Progress Note Intern Pager: [phone]  Patient name: Tiffany [name] Medical record number: [id number] Date of birth: August 30, 1960 Age: 56 y.o. Gender: female  Primary Care Provider: [name], MD Consultants: None Code Status: FULL  Pt Overview and Major Events to Date:  10/19 Placed in observation for chest pain  Assessment and Plan: Tiffany [name] is a 56 y.o. female presenting with chest pain. PMH is significant for asthma/COPD (6L home O2 daytime, "ventilator"QHS), CKDII, T2DM, HFpEF, HLD, HTN, Bipolar 1 disorder, paranoid schizophrenia, and hx of cocaine abuse.  Chest pain: Differential includes PE, cardiac etiology, CHF exacerbation, MSK etiology. States that pain was improved with taking 4 ASA. Less likely cardiac etiology, as troponin neg, EKG with no significant changes from prior, and pain reproducible on exam. CHF exacerbation also less likely, as patient's weight down and BNP WNL. Concern for PE given reported SOB and elevated D-dimer 0.79, however no PE noted on CTA. CXR with no active cardiopulmonary disease. Pain reproducible on exam, making MSK higher on differential particularly given recent hx of chest compressions. Pain now resolved except upon palpation. Trop negx3. Continues to improve this AM.  - Telemetry - Tylenol PRN  Hypokalemia: Possibly due to overdiuresis. K 2.8 on admission. Was 3.2 on 10/16. Prescribed KDur 50 mEq BID at home. K pending this AM.  - F/u K. Replace as needed.   AKI: Likely due to overdiuresis. Cr 1.57 on admission. Baseline ~1.2. Drinking well, particularly drinking more than recommended 2L fluid restriction, so doubt dehydration 2/2 poor PO intake. Cr pending this AM.  - F/u Cr - Given HFpEF and increased weight, will hold IVF for now - PO fluid intake, though within 2L fluid restriction for CHF - Holding metolazone   HFpEF: Last echo 10/30/16 with EF 60-65%. On torsemide and recently added  metolazone at home. Weight appears to have gone up 5lbs from [facility] clinic app[facility] with Dr. Emmaline Life on 10/11. Mild LE pitting edema on exam suggesting some fluid overload, however patient with elevated Cr and hypokalemia likely due to overdiuresis, so do not want to continue to heavily diurese.  - Continue home torsemide   - Hold home metolazone - Fluid restricted diet (2L)  - Holding IVF  COPD: Fairly well-controlled on current regimen. Satting well on home O2 requirement and lungs CTAB, so doubt COPD exacerbation as cause of symptoms.  - Cotinue home Spiriva, Breo, Singulair, will bring in home trilogy machine for sleeping - Albuterol neg PRN - Continue home O2 - Pulse ox with vitals  HTN: BP normotensive overnight.  - Continue home amlodipine  Type II DM: Last A1C 5.6 on 06/20/218, however patient recently reporting elevated blood sugar at home. On metformin 500mg  BID at home. CBG 123, 138 overnight.  - Hold metformin - Continue home gabapentin for nephropathy - Repeat A1C - CBG qACHS - SSI  OSA:  - Son to bring home trilogy (patient prefers to use her own)  Bipolar disorder:  - Continue home Lexapro, [name], Geodon, Remeron  Closed fracture of right tibia: Followed by Dr. Erlinda Hong on 10/12/16, currently in boot, no new complaints. - No new intervention  HLD: - Continue home atorvastatin  FEN/GI: regular diet with 2L fluid restriction Prophylaxis: Lovenox renal dose  Disposition: Likely home today  Subjective:  No acute events overnight. Patient reporting continued improvement in chest pain, and pain only present with palpation. Denies any other complaints. Said she would feel comfortable going home today.  Objective: Temp:  [98 F (36.7 C)-98.6 F (37 C)] 98 F (36.7 C) (10/20 0505) Pulse Rate:  [69-98] 69 (10/20 0505) Resp:  [10-23] 16 (10/20 0505) BP: (101-148)/(67-108) 132/74 (10/20 0505) SpO2:  [96 %-100 %] 98 % (10/20 0505) Weight:  [295 lb 13.7 oz (134.2  kg)-303 lb (137.4 kg)] 296 lb 8.3 oz (134.5 kg) (10/20 0505) Physical Exam: General: sleeping comfortably upon entering the room wearing Trilogy mask, in NAD Cardiovascular: RRR, no murmurs appreciated Respiratory: Trilogy mask in place; lungs CTAB, no wheezes or rhales Abdomen: soft, non-tender, non-distended, +BS Extremities: trace pitting edema to shin bilaterally Neuro: A&Ox3 Psych: appropriate mood and affect  Laboratory:  Recent Labs Lab 11/03/16 1523  WBC 8.4  HGB 9.7*  HCT 32.7*  PLT 322    Recent Labs Lab 10/31/16 1544 11/03/16 1523  NA 149* 142  K 3.2* 2.8*  CL 98 89*  CO2 33* 42*  BUN 20 20  CREATININE 1.04* 1.57*  CALCIUM 9.6 9.5  PROT  --  7.0  BILITOT  --  0.3  ALKPHOS  --  154*  ALT  --  17  AST  --  19  GLUCOSE 122* 108*     Imaging/Diagnostic Tests: Ct Angio Chest Pe W Or Wo Contrast  Result Date: 11/03/2016 CLINICAL DATA:  Shortness of breath and elevated D-dimer. Chest pain. EXAM: CT ANGIOGRAPHY CHEST WITH CONTRAST TECHNIQUE: Multidetector CT imaging of the chest was performed using the standard protocol during bolus administration of intravenous contrast. Multiplanar CT image reconstructions and MIPs were obtained to evaluate the vascular anatomy. CONTRAST:  75 mL Isovue 370 COMPARISON:  Chest CTA 10/07/2016 FINDINGS: Cardiovascular: Contrast injection is sufficient to demonstrate satisfactory opacification of the pulmonary arteries to the segmental level. There is no pulmonary embolus. The main pulmonary artery is mildly enlarged measuring 3.4 cm. There is no CT evidence of acute right heart strain. The visualized aorta is normal. There is a normal 3-vessel arch branching pattern. Heart size is normal, without pericardial effusion. Mediastinum/Nodes: No mediastinal, hilar or axillary lymphadenopathy. The visualized thyroid and thoracic esophageal course are unremarkable. Lungs/Pleura: No pulmonary nodules or masses. No pleural effusion or  pneumothorax. No focal airspace consolidation. No focal pleural abnormality. Upper Abdomen: Contrast bolus timing is not optimized for evaluation of the abdominal organs. Within this limitation, the visualized organs of the upper abdomen are normal. Musculoskeletal: Multiple old, healed left anterior rib fractures. Review of the MIP images confirms the above findings. IMPRESSION: 1. No pulmonary embolus. 2. No other acute thoracic abnormality. 3. Mildly enlarged main pulmonary artery may indicate underlying pulmonary hypertension. Electronically Signed   By: Ulyses Jarred M.D.   On: 11/03/2016 19:25   Dg Chest Port 1 View  Result Date: 11/03/2016 CLINICAL DATA:  SOB that began this morning. Pt has recent hx of cardiac arrest and is undergoing rehab at this time. Pt has non-radiating cp under the left breast that is tender to palpation. EXAM: PORTABLE CHEST 1 VIEW COMPARISON:  10/24/2016 FINDINGS: The heart size and mediastinal contours are within normal limits. Both lungs are clear. The visualized skeletal structures are unremarkable. IMPRESSION: No active disease. Electronically Signed   By: Kathreen Devoid   On: 11/03/2016 15:12     Verner Mould, MD 11/04/2016, 6:52 AM PGY-3, [place] Intern pager: [phone], text pages welcome

## 2016-11-03 NOTE — Telephone Encounter (Signed)
Received call directly from operation from [facility], Blunt with [place] who is currently with the patient. She states the patient c/o increased SOB and abdominal distention. Patient denies weight gain and verbalizes compliance to medications. She states her leg swelling is unchanged. She states her PCP advised her to take metolazone on Saturday in addition to today and f/u next week if weight increases. Caryl Pina states that the patient also c/o CP but does not have NTG. I advised that patient does not have hx of CAD, nor is there a cardiac catheterization report to refer to in our system. I advised that if Caryl Pina or the patient feels that these symptoms are unrelated to CHF, that she should call 911 for transport to the ED. Caryl Pina verbalized understanding and agreement and thanked me for the call.

## 2016-11-03 NOTE — ED Triage Notes (Signed)
Pt arrived via [facility] with c/c of SOB that began this morning. Pt has recent hx of cardiac arrest and is undergoing rehab at this time. Pt has non-radiating cp under the left breast that is tender to palpation. Pt normally on 6LPM oxygen at home. Stated pain is 4/10. Pt also stated she feels increased tension in her abdomen. EMS V/s: cbg 272 (hx of DM), 98% on 6LPM, 110/67.

## 2016-11-03 NOTE — ED Notes (Addendum)
EKG accomplished by NT upon ED arrival. EKG given to EDP

## 2016-11-04 DIAGNOSIS — I509 Heart failure, unspecified: Secondary | ICD-10-CM | POA: Diagnosis not present

## 2016-11-04 DIAGNOSIS — E876 Hypokalemia: Secondary | ICD-10-CM

## 2016-11-04 DIAGNOSIS — R071 Chest pain on breathing: Secondary | ICD-10-CM | POA: Diagnosis not present

## 2016-11-04 DIAGNOSIS — N179 Acute kidney failure, unspecified: Secondary | ICD-10-CM | POA: Diagnosis not present

## 2016-11-04 LAB — BASIC METABOLIC PANEL
Anion gap: 17 — ABNORMAL HIGH (ref 5–15)
Anion gap: 14 (ref 5–15)
BUN: 25 mg/dL — ABNORMAL HIGH (ref 6–20)
BUN: 25 mg/dL — ABNORMAL HIGH (ref 6–20)
CO2: 36 mmol/L — ABNORMAL HIGH (ref 22–32)
CO2: 39 mmol/L — ABNORMAL HIGH (ref 22–32)
Calcium: 9.1 mg/dL (ref 8.9–10.3)
Calcium: 9.9 mg/dL (ref 8.9–10.3)
Chloride: 86 mmol/L — ABNORMAL LOW (ref 101–111)
Chloride: 87 mmol/L — ABNORMAL LOW (ref 101–111)
Creatinine, Ser: 1.5 mg/dL — ABNORMAL HIGH (ref 0.44–1.00)
Creatinine, Ser: 1.63 mg/dL — ABNORMAL HIGH (ref 0.44–1.00)
GFR calc Af Amer: 44 mL/min — ABNORMAL LOW (ref >60)
GFR calc Af Amer: 39 mL/min — ABNORMAL LOW (ref >60)
GFR calc non Af Amer: 38 mL/min — ABNORMAL LOW (ref >60)
GFR calc non Af Amer: 34 mL/min — ABNORMAL LOW (ref >60)
Glucose, Bld: 105 mg/dL — ABNORMAL HIGH (ref 65–99)
Glucose, Bld: 122 mg/dL — ABNORMAL HIGH (ref 65–99)
Potassium: 2.9 mmol/L — ABNORMAL LOW (ref 3.5–5.1)
Potassium: 3.5 mmol/L (ref 3.5–5.1)
Sodium: 139 mmol/L (ref 135–145)
Sodium: 140 mmol/L (ref 135–145)

## 2016-11-04 LAB — GLUCOSE, CAPILLARY
Glucose-Capillary: 178 mg/dL — ABNORMAL HIGH (ref 65–99)
Glucose-Capillary: 102 mg/dL — ABNORMAL HIGH (ref 65–99)
Glucose-Capillary: 109 mg/dL — ABNORMAL HIGH (ref 65–99)
Glucose-Capillary: 116 mg/dL — ABNORMAL HIGH (ref 65–99)

## 2016-11-04 LAB — TROPONIN I: Troponin I: <0.03 ng/mL (ref <0.03)

## 2016-11-04 MED ORDER — POTASSIUM CHLORIDE CRYS ER 20 MEQ PO TBCR
40.0000 meq | EXTENDED_RELEASE_TABLET | Freq: Once | ORAL | Status: AC
  Administered 2016-11-04: 40 meq via ORAL
  Filled 2016-11-04: qty 2

## 2016-11-04 MED ORDER — POTASSIUM CHLORIDE 10 MEQ/100ML IV SOLN
10.0000 meq | INTRAVENOUS | Status: DC
  Administered 2016-11-04: 10 meq via INTRAVENOUS
  Filled 2016-11-04: qty 100

## 2016-11-04 MED ORDER — ENOXAPARIN SODIUM 40 MG/0.4ML ~~LOC~~ SOLN
40.0000 mg | SUBCUTANEOUS | Status: DC
  Administered 2016-11-04: 40 mg via SUBCUTANEOUS
  Filled 2016-11-04: qty 0.4

## 2016-11-04 MED ORDER — TIOTROPIUM BROMIDE MONOHYDRATE 18 MCG IN CAPS
18.0000 ug | ORAL_CAPSULE | Freq: Every day | RESPIRATORY_TRACT | Status: DC
  Administered 2016-11-04 – 2016-11-05 (×2): 18 ug via RESPIRATORY_TRACT
  Filled 2016-11-04: qty 5

## 2016-11-04 NOTE — Progress Notes (Signed)
Iv potassium hung, patient complained of burning.  IV potassium drip rate decreased to 75ml/hr, patient still complained of burning.  Will notify MD

## 2016-11-04 NOTE — Progress Notes (Signed)
PT brought home CPAP.  CPAP clean, wires are not frayed.  RT setup PT CPAP and PT will place on self when ready.  PT wears 6L O2 with CPAP.  Patient currently on 6L [place] RN agreed to hook up CPAP O2 when patient is ready to put on CPAP.

## 2016-11-05 DIAGNOSIS — E876 Hypokalemia: Secondary | ICD-10-CM | POA: Diagnosis not present

## 2016-11-05 DIAGNOSIS — I509 Heart failure, unspecified: Secondary | ICD-10-CM | POA: Diagnosis not present

## 2016-11-05 DIAGNOSIS — N179 Acute kidney failure, unspecified: Secondary | ICD-10-CM | POA: Diagnosis not present

## 2016-11-05 DIAGNOSIS — R071 Chest pain on breathing: Secondary | ICD-10-CM | POA: Diagnosis not present

## 2016-11-05 LAB — BASIC METABOLIC PANEL
Anion gap: 13 (ref 5–15)
Anion gap: 13 (ref 5–15)
BUN: 29 mg/dL — ABNORMAL HIGH (ref 6–20)
BUN: 31 mg/dL — ABNORMAL HIGH (ref 6–20)
CO2: 41 mmol/L — ABNORMAL HIGH (ref 22–32)
CO2: 39 mmol/L — ABNORMAL HIGH (ref 22–32)
Calcium: 9.7 mg/dL (ref 8.9–10.3)
Calcium: 9.7 mg/dL (ref 8.9–10.3)
Chloride: 89 mmol/L — ABNORMAL LOW (ref 101–111)
Chloride: 89 mmol/L — ABNORMAL LOW (ref 101–111)
Creatinine, Ser: 1.58 mg/dL — ABNORMAL HIGH (ref 0.44–1.00)
Creatinine, Ser: 1.72 mg/dL — ABNORMAL HIGH (ref 0.44–1.00)
GFR calc Af Amer: 41 mL/min — ABNORMAL LOW (ref >60)
GFR calc Af Amer: 37 mL/min — ABNORMAL LOW (ref >60)
GFR calc non Af Amer: 35 mL/min — ABNORMAL LOW (ref >60)
GFR calc non Af Amer: 32 mL/min — ABNORMAL LOW (ref >60)
Glucose, Bld: 100 mg/dL — ABNORMAL HIGH (ref 65–99)
Glucose, Bld: 116 mg/dL — ABNORMAL HIGH (ref 65–99)
Potassium: 3 mmol/L — ABNORMAL LOW (ref 3.5–5.1)
Potassium: 3.3 mmol/L — ABNORMAL LOW (ref 3.5–5.1)
Sodium: 143 mmol/L (ref 135–145)
Sodium: 141 mmol/L (ref 135–145)

## 2016-11-05 LAB — GLUCOSE, CAPILLARY
Glucose-Capillary: 108 mg/dL — ABNORMAL HIGH (ref 65–99)
Glucose-Capillary: 128 mg/dL — ABNORMAL HIGH (ref 65–99)
Glucose-Capillary: 99 mg/dL (ref 65–99)

## 2016-11-05 MED ORDER — POTASSIUM CHLORIDE CRYS ER 20 MEQ PO TBCR
50.0000 meq | EXTENDED_RELEASE_TABLET | Freq: Once | ORAL | Status: AC
  Administered 2016-11-05: 50 meq via ORAL

## 2016-11-05 MED ORDER — POTASSIUM CHLORIDE ER 10 MEQ PO TBCR: 50.0000 meq | EXTENDED_RELEASE_TABLET | Freq: Every day | ORAL | 0 refills | Status: DC

## 2016-11-05 MED ORDER — POTASSIUM CHLORIDE CRYS ER 10 MEQ PO TBCR
50.0000 meq | EXTENDED_RELEASE_TABLET | Freq: Once | ORAL | Status: AC
  Administered 2016-11-05: 50 meq via ORAL
  Filled 2016-11-05: qty 1

## 2016-11-05 NOTE — Progress Notes (Signed)
Discharge instructions reviewed with and given to patient. Patient taken to private vehicle via wheelchair.

## 2016-11-05 NOTE — Discharge Instructions (Addendum)
1-Your potassium is still lower than normal, therefore I will start you on potassium supplementation. You will take 50 mEq a day and will follow up with your primary care provider and cardiologist on the appropriate dose going forward.  2-Continue to take your torsemide as discussed 80 mg two times a day. Stop the lasix as instructed by cardiologist  3-For the metolazone, you can take it only if your weight significantly exceed your dry. Discuss with cardiologist on 10/24 and PCP.

## 2016-11-06 ENCOUNTER — Ambulatory Visit (INDEPENDENT_AMBULATORY_CARE_PROVIDER_SITE_OTHER): Payer: Medicare HMO | Admitting: Family Medicine

## 2016-11-06 ENCOUNTER — Encounter: Payer: Self-pay | Admitting: Family Medicine

## 2016-11-06 VITALS — BP 112/68 | HR 80 | Temp 98.4°F | Ht 64.0 in | Wt 298.6 lb

## 2016-11-06 DIAGNOSIS — E876 Hypokalemia: Secondary | ICD-10-CM

## 2016-11-06 MED ORDER — POTASSIUM CHLORIDE ER 10 MEQ PO TBCR: 50.0000 meq | EXTENDED_RELEASE_TABLET | Freq: Every day | ORAL | 1 refills | Status: DC

## 2016-11-06 NOTE — Assessment & Plan Note (Addendum)
Patient is following up for dyspnea; have been following the patient twice weekly - Weight up slightly - 6 lbs weight gain on home scale  -Seen by cardiology yesterday placed on torsemide 80 mg twice daily with metolazone 2.5 mg -Noted to have pitting edema in left leg 1+; crackles noted, no JVD -Plan will add metolazone on x1 Thursday and Friday, torsemide 80 mg twice daily -Patient develops dyspnea over the weekend or increasing weight gain will take another metolazone otherwise follow per cardiology cardiology recommendations-Monday, Wednesday and Friday -Follow-up next Thursday

## 2016-11-06 NOTE — Patient Instructions (Signed)
It was good to see you today.I'm glad you're feeling well.  We are checking your potassium.  Make sure to follow-up on Thursday at your previously scheduled appointment.

## 2016-11-06 NOTE — Progress Notes (Signed)
Subjective:    Tiffany [name] is a 56 y.o. female who presents to [facility] today for FU for hospitalization:  1.  Hospital FU:  Patient recently hospitalized for chest pain and dyspnea.  She has known chronic congestive heart failure.  Workup was negative for any pulmonary embolism. She has had no elevation of her troponins or other concern for ACS. Chest is felt to be musculoskeletal. She's had recent chest compressions due to PEA about 2 months ago. She was found to also be hypokalemic persistently low and patient. She was started on 50 mg a day of K-Dur. The  Since leaving the hospital yesterday she has done well. [name] on her usual 6 induction throughout the day as well as BiPAP at night. She's had no further chest pain. No further dyspnea. States that she feels "very well." No exudative edema. [name] on her usual daily torsemide as well as metolazone Monday Wednesday and Friday.  No cough.   ROS as above per HPI.    The following portions of the patient's history were reviewed and updated as appropriate: allergies, current medications, past medical history, family and social history, and problem list. Patient is a nonsmoker.    PMH reviewed.  Past Medical History:  Diagnosis Date  . Anxiety   . Arthritis    "all over" (04/10/2016)  . Asthma   . Cardiac arrest ([place]) 09/08/2016   PEA  . CHF (congestive heart failure) ([place])   . Chronic bronchitis ([place])   . Chronic kidney disease    "I see a kidney dr." (04/10/2016)  . Cocaine abuse ([place])   . Complication of anesthesia    decreased bp, decreased heart rate  . Depression   . Disorder of nervous system   . Emphysema   . GERD (gastroesophageal reflux disease)   . Heart attack ([place]) 1980s  . History of blood transfusion 1994   "couldn't stop bleeding from my period"  . Hyperlipidemia LDL goal <70   . Hypertension   . Incontinence   . Manic depression ([place])   . On home oxygen therapy    "6L; 24/7" (04/10/2016)  . OSA on CPAP    "wear mask sometimes" (04/10/2016)  . Paranoid ([place])    "sometimes; I'm on RX for it" (04/10/2016)  . Pneumonia    "I've had it several times; haven't had it since 06/2015" (04/10/2016)  . Schizophrenia ([place])   . Seasonal allergies   . Seizures ([place])    "don't know what kind; last one was ~ 1 yr ago" (04/10/2016)  . Sinus trouble   . Stroke [facility]) 1980s   denies residual on 04/10/2016  . Type II diabetes mellitus ([place])    Past Surgical History:  Procedure Laterality Date  . CESAREAN SECTION  1997  . HERNIA REPAIR    . IR CHOLANGIOGRAM EXISTING TUBE  07/20/2016  . IR PERC CHOLECYSTOSTOMY  05/10/2016  . IR RADIOLOGIST EVAL & MGMT  06/08/2016  . IR SINUS/FIST TUBE CHK-NON GI  07/12/2016  . TIBIA IM NAIL INSERTION Right 07/12/2016   Procedure: INTRAMEDULLARY (IM) NAIL RIGHT TIBIA;  Surgeon: Leandrew Koyanagi, MD;  Location: [place];  Service: Orthopedics;  Laterality: Right;  . UMBILICAL HERNIA REPAIR  ~ 1963   "that's why I don't have a belly button"  . VAGINAL HYSTERECTOMY      Medications reviewed. Current Outpatient Prescriptions  Medication Sig Dispense Refill  . albuterol (PROVENTIL) (2.5 MG/3ML) 0.083% nebulizer solution Take 3 mLs (2.5 mg  total) by nebulization every 6 (six) hours as needed for wheezing or shortness of breath. 150 mL 1  . amLODipine (NORVASC) 5 MG tablet Take 1 tablet (5 mg total) by mouth daily. 90 tablet 0  . aspirin 81 MG EC tablet Take 1 tablet (81 mg total) by mouth daily. Swallow whole. 90 tablet 0  . atorvastatin (LIPITOR) 80 MG tablet Take 80 mg by mouth 2 (two) times daily.    . divalproex ([name] ER) 500 MG 24 hr tablet Take 3 tablets (1,500 mg total) by mouth at bedtime. 90 tablet 1  . escitalopram (LEXAPRO) 20 MG tablet Take 1 tablet (20 mg total) by mouth daily. 30 tablet 1  . fluticasone furoate-vilanterol (BREO ELLIPTA) 100-25 MCG/INH AEPB INHALE 1 PUFF EVERY DAY 180 each 0  . gabapentin (NEURONTIN) 600 MG tablet Take 1 tablet (600 mg total) by mouth 3  (three) times daily. 270 tablet 0  . guaiFENesin (MUCINEX) 600 MG 12 hr tablet Take 1 tablet (600 mg total) by mouth 2 (two) times daily. 30 tablet 0  . metFORMIN (GLUCOPHAGE-XR) 500 MG 24 hr tablet Take 1 tablet (500 mg total) by mouth 2 (two) times daily. 180 tablet 0  . metolazone (ZAROXOLYN) 2.5 MG tablet Take 1 tablet 3 days per week (Mondays, Wednesdays, and Fridays). Take 30 minutes before taking Torsemide 45 tablet 3  . mirtazapine (REMERON SOL-TAB) 30 MG disintegrating tablet Take 1 tablet (30 mg total) by mouth at bedtime. 30 tablet 1  . montelukast (SINGULAIR) 10 MG tablet Take 1 tablet (10 mg total) by mouth at bedtime. 90 tablet 1  . Multiple Vitamin (MULTIVITAMIN WITH MINERALS) TABS tablet Take 1 tablet by mouth daily.    [name] omeprazole (PRILOSEC) 40 MG capsule Take 1 capsule (40 mg total) by mouth 2 (two) times daily. 180 capsule 0  . OXYGEN Inhale 6 L into the lungs daily.    . potassium chloride (K-DUR) 10 MEQ tablet Take 5 tablets (50 mEq total) by mouth daily. 60 tablet 0  . tiotropium (SPIRIVA HANDIHALER) 18 MCG inhalation capsule Place 1 capsule (18 mcg total) into inhaler and inhale daily. 90 capsule 1  . torsemide (DEMADEX) 20 MG tablet Take 4 tablets (80 mg total) by mouth 2 (two) times daily. 720 tablet 3  . Trolamine Salicylate 10 % LOTN Apply 1 patch topically 3 (three) times daily as needed. 141 g 2  . ziprasidone (GEODON) 80 MG capsule Take 1 capsule (80 mg total) by mouth at bedtime. 30 capsule 1   No current facility-administered medications for this visit.      Objective:   Physical Exam BP 112/68   Pulse 80   Temp 98.4 F (36.9 C) (Oral)   Ht '5\' 4"'  (1.626 m)   Wt 298 lb 9.6 oz (135.4 kg)   SpO2 96% Comment: '@6L'   BMI 51.25 kg/m  Gen:  Alert, cooperative patient who appears stated age in no acute distress.  Vital signs reviewed. HEENT: EOMI,  MMM Cardiac:  Regular rate and rhythm without murmur auscultated.  Good S1/S2. Pulm:  Clear to auscultation  bilaterally with good air movement.  No wheezes or rales noted.   Exts: Non edematous BL  LE, warm and well perfused.   Impression/plan: 1. Hypokalemia: -[name] still taking her 50 mg a day of K-Dur. Marya Amsler checking be met today to check her potassium. -I will call her with these results. -She also has a follow-up appointment already scheduled for Thursday.  2. Chest pain: -Resolved. -  No dyspnea currently or sensation in the hospital. -She states she feels better than she has felt in quite some time. -Continue oxygen plus BiPAP. FU on Thursday.

## 2016-11-07 ENCOUNTER — Telehealth: Payer: Self-pay | Admitting: Family Medicine

## 2016-11-07 LAB — BASIC METABOLIC PANEL
BUN/Creatinine Ratio: 22 (ref 9–23)
BUN: 31 mg/dL — ABNORMAL HIGH (ref 6–24)
CO2: 35 mmol/L — ABNORMAL HIGH (ref 20–29)
Calcium: 10.5 mg/dL — ABNORMAL HIGH (ref 8.7–10.2)
Chloride: 95 mmol/L — ABNORMAL LOW (ref 96–106)
Creatinine, Ser: 1.4 mg/dL — ABNORMAL HIGH (ref 0.57–1.00)
GFR calc Af Amer: 48 mL/min/{1.73_m2} — ABNORMAL LOW (ref >59)
GFR calc non Af Amer: 42 mL/min/{1.73_m2} — ABNORMAL LOW (ref >59)
Glucose: 97 mg/dL (ref 65–99)
Potassium: 4 mmol/L (ref 3.5–5.2)
Sodium: 149 mmol/L — ABNORMAL HIGH (ref 134–144)

## 2016-11-07 NOTE — Telephone Encounter (Signed)
Left voicemail that potassium was completely normal.  [name] to keep her appt with PCP on Thursday.

## 2016-11-08 ENCOUNTER — Ambulatory Visit (INDEPENDENT_AMBULATORY_CARE_PROVIDER_SITE_OTHER): Payer: Medicare HMO | Admitting: Psychiatry

## 2016-11-08 ENCOUNTER — Encounter (HOSPITAL_COMMUNITY): Payer: Self-pay | Admitting: Psychiatry

## 2016-11-08 ENCOUNTER — Other Ambulatory Visit (HOSPITAL_COMMUNITY): Payer: Self-pay | Admitting: Psychiatry

## 2016-11-08 VITALS — BP 138/90 | HR 88 | Ht 64.0 in | Wt 298.8 lb

## 2016-11-08 DIAGNOSIS — R0602 Shortness of breath: Secondary | ICD-10-CM

## 2016-11-08 DIAGNOSIS — R002 Palpitations: Secondary | ICD-10-CM

## 2016-11-08 DIAGNOSIS — I130 Hypertensive heart and chronic kidney disease with heart failure and stage 1 through stage 4 chronic kidney disease, or unspecified chronic kidney disease: Secondary | ICD-10-CM | POA: Diagnosis not present

## 2016-11-08 DIAGNOSIS — R635 Abnormal weight gain: Secondary | ICD-10-CM

## 2016-11-08 DIAGNOSIS — R4587 Impulsiveness: Secondary | ICD-10-CM | POA: Diagnosis not present

## 2016-11-08 DIAGNOSIS — M549 Dorsalgia, unspecified: Secondary | ICD-10-CM | POA: Diagnosis not present

## 2016-11-08 DIAGNOSIS — F200 Paranoid schizophrenia: Secondary | ICD-10-CM

## 2016-11-08 DIAGNOSIS — R450 Nervousness: Secondary | ICD-10-CM

## 2016-11-08 DIAGNOSIS — R454 Irritability and anger: Secondary | ICD-10-CM | POA: Diagnosis not present

## 2016-11-08 DIAGNOSIS — Z87891 Personal history of nicotine dependence: Secondary | ICD-10-CM

## 2016-11-08 DIAGNOSIS — I5030 Unspecified diastolic (congestive) heart failure: Secondary | ICD-10-CM | POA: Diagnosis not present

## 2016-11-08 DIAGNOSIS — R5381 Other malaise: Secondary | ICD-10-CM

## 2016-11-08 DIAGNOSIS — J441 Chronic obstructive pulmonary disease with (acute) exacerbation: Secondary | ICD-10-CM | POA: Diagnosis not present

## 2016-11-08 DIAGNOSIS — S82201D Unspecified fracture of shaft of right tibia, subsequent encounter for closed fracture with routine healing: Secondary | ICD-10-CM | POA: Diagnosis not present

## 2016-11-08 DIAGNOSIS — Z818 Family history of other mental and behavioral disorders: Secondary | ICD-10-CM | POA: Diagnosis not present

## 2016-11-08 DIAGNOSIS — E1122 Type 2 diabetes mellitus with diabetic chronic kidney disease: Secondary | ICD-10-CM | POA: Diagnosis not present

## 2016-11-08 DIAGNOSIS — I272 Pulmonary hypertension, unspecified: Secondary | ICD-10-CM | POA: Diagnosis not present

## 2016-11-08 DIAGNOSIS — R5383 Other fatigue: Secondary | ICD-10-CM

## 2016-11-08 DIAGNOSIS — F39 Unspecified mood [affective] disorder: Secondary | ICD-10-CM | POA: Diagnosis not present

## 2016-11-08 DIAGNOSIS — E1140 Type 2 diabetes mellitus with diabetic neuropathy, unspecified: Secondary | ICD-10-CM | POA: Diagnosis not present

## 2016-11-08 DIAGNOSIS — M2550 Pain in unspecified joint: Secondary | ICD-10-CM | POA: Diagnosis not present

## 2016-11-08 DIAGNOSIS — N182 Chronic kidney disease, stage 2 (mild): Secondary | ICD-10-CM | POA: Diagnosis not present

## 2016-11-08 DIAGNOSIS — D631 Anemia in chronic kidney disease: Secondary | ICD-10-CM | POA: Diagnosis not present

## 2016-11-08 DIAGNOSIS — F1421 Cocaine dependence, in remission: Secondary | ICD-10-CM

## 2016-11-08 MED ORDER — ARIPIPRAZOLE 5 MG PO TABS: ORAL_TABLET | ORAL | 1 refills | Status: DC

## 2016-11-08 NOTE — Progress Notes (Signed)
[place] MD/PA/NP OP Progress Note  11/08/2016 8:06 AM Tiffany [name]  MRN:  [id number]  Chief Complaint: I am gaining weight. Chief Complaint    Follow-up     HPI: patient came for her follow-up appointment earlier than her scheduled time.  [name] very concerned about her weight gain.  She has been in and out from the hospital due to her physical condition.  [name] very sad and disappointed about her physical health.  She admitted impulsively eating too much and she is very concerned about her weight gain. [name] very tired due to shortness of breath.  She continues to have irritability, mood swing and anger episodes.  She does not believe [name] helping.  She also reported poor sleep and racing thoughts.  Patient has multiple health issues.  She supposed to use CPAP machine.  She did not have [name] level which was recommended.  We discussed in detail polypharmacy.  Patient denies any hallucination, paranoia but admitted sometime feeling hopeless helpless and nervous about her physical ealth.  I reviewed blood work results and she had borderline high creatinine and her BUN is 31. She endorsed lack of energy, easily tired, exhausted,.  [name] taking Geodon, [name] but due to high  QTC interval we did not increase Geodon dose.  The patient denies suicidal thoughts but remains very sad and nervous.  [name] scheduled to see a therapist on 31st.  She feels proud that she has not use any drugs or cocaine since February. Patient is still have a cast on her right lack and she has difficulty walking.  She uses wheelchair and walker at home.   Visit Diagnosis:    ICD-10-CM   1. Paranoid schizophrenia ([place]) F20.0 ARIPiprazole (ABILIFY) 5 MG tablet    Past Psychiatric History: reviewed. Patient has multiple psychiatric hospitalization due to drug use and mental illness.  Her last hospital and was in November 2017 at [facility].  She had psychiatric illness started in 04-10-84 when her daughter  died due to cancer.  In the past she had tried Abilify, Risperdal, Wellbutrin, Xanax, Klonopin, trazodone and BuSpar.  She saw psychiatrist at [facility]  Patient denies any history of suicidal attempt but admitted history of suicidal thoughts.  She has history of paranoia, delusion, anxiety, manic-like symptoms.  She used to take Geodon 180 mg but does reduced due to high QTC interval.  Past Medical History:  Past Medical History:  Diagnosis Date  . Anxiety   . Arthritis    "all over" (04/10/2016)  . Asthma   . Cardiac arrest ([place]) 09/08/2016   PEA  . CHF (congestive heart failure) ([place])   . Chronic bronchitis ([place])   . Chronic kidney disease    "I see a kidney dr." (04/10/2016)  . Cocaine abuse ([place])   . Complication of anesthesia    decreased bp, decreased heart rate  . Depression   . Disorder of nervous system   . Emphysema   . GERD (gastroesophageal reflux disease)   . Heart attack ([place]) 1980s  . History of blood transfusion 1994   "couldn't stop bleeding from my period"  . Hyperlipidemia LDL goal <70   . Hypertension   . Incontinence   . Manic depression ([place])   . On home oxygen therapy    "6L; 24/7" (04/10/2016)  . OSA on CPAP    "wear mask sometimes" (04/10/2016)  . Paranoid ([place])    "sometimes; I'm on RX for it" (04/10/2016)  .  Pneumonia    "I've had it several times; haven't had it since 06/2015" (04/10/2016)  . Schizophrenia ([place])   . Seasonal allergies   . Seizures ([place])    "don't know what kind; last one was ~ 1 yr ago" (04/10/2016)  . Sinus trouble   . Stroke [facility]) 1980s   denies residual on 04/10/2016  . Type II diabetes mellitus ([place])     Past Surgical History:  Procedure Laterality Date  . CESAREAN SECTION  1997  . HERNIA REPAIR    . IR CHOLANGIOGRAM EXISTING TUBE  07/20/2016  . IR PERC CHOLECYSTOSTOMY  05/10/2016  . IR RADIOLOGIST EVAL & MGMT  06/08/2016  . IR SINUS/FIST TUBE CHK-NON GI  07/12/2016  . TIBIA IM NAIL INSERTION Right 07/12/2016   Procedure:  INTRAMEDULLARY (IM) NAIL RIGHT TIBIA;  Surgeon: Leandrew Koyanagi, MD;  Location: [place];  Service: Orthopedics;  Laterality: Right;  . UMBILICAL HERNIA REPAIR  ~ 1963   "that's why I don't have a belly button"  . VAGINAL HYSTERECTOMY      Family Psychiatric History: Reviewed.  Family History:  Family History  Problem Relation Age of Onset  . Cancer Father        prostate  . Cancer Mother        lung  . Depression Mother   . Depression Sister   . Anxiety disorder Sister   . Schizophrenia Sister   . Bipolar disorder Sister   . Depression Sister   . Depression Brother   . Heart failure Unknown        cousin    Social History:  Social History   Social History  . Marital status: Widowed    Spouse name: N/A  . Number of children: 3  . Years of education: N/A   Occupational History  . disabled     factory production   Social History Main Topics  . Smoking status: Former Smoker    Packs/day: 1.50    Years: 38.00    Types: Cigarettes    Start date: 03/13/1977    Quit date: 04/10/2016  . Smokeless tobacco: Never Used  . Alcohol use No  . Drug use: No     Comment: 04/10/2016 "last used cocaine back in November 2017"  . Sexual activity: No   Other Topics Concern  . Not on file   Social History Narrative   Has 1 son, [name]   Lives with son and his boyfriend   Her house has ramps and handrails should she ever needs them.    Her mother lives down the street from her and is a good support person in addition to her son.   She drives herself, has private transportation.    Cocaine free since 02/24/16, smoke free since 04/10/16       Allergies:  Allergies  Allergen Reactions  . Hydrocodone-Acetaminophen Shortness Of Breath  . Hydroxyzine Anaphylaxis and Shortness Of Breath  . Latuda [Lurasidone Hcl] Anaphylaxis  . Codeine Nausea And Vomiting  . Sulfa Antibiotics Itching    Metabolic Disorder Labs: Lab Results  Component Value Date   HGBA1C 5.5 11/03/2016   MPG 111.15  11/03/2016   MPG 120 04/10/2016   No results found for: PROLACTIN Lab Results  Component Value Date   CHOL 113 07/10/2016   TRIG 91 07/10/2016   HDL 51 07/10/2016   CHOLHDL 2.2 07/10/2016   VLDL 18 07/10/2016   LDLCALC 44 07/10/2016   LDLCALC 62 12/04/2015   Lab Results  Component Value Date   TSH 0.563 03/15/2016   TSH 1.060 12/04/2015    Therapeutic Level Labs: No results found for: LITHIUM Lab Results  Component Value Date   VALPROATE 20 (L) 09/09/2016   VALPROATE 46 (L) 07/10/2016   No components found for:  CBMZ  Current Medications: Current Outpatient Prescriptions  Medication Sig Dispense Refill  . albuterol (PROVENTIL) (2.5 MG/3ML) 0.083% nebulizer solution Take 3 mLs (2.5 mg total) by nebulization every 6 (six) hours as needed for wheezing or shortness of breath. 150 mL 1  . amLODipine (NORVASC) 5 MG tablet Take 1 tablet (5 mg total) by mouth daily. 90 tablet 0  . ARIPiprazole (ABILIFY) 5 MG tablet Take 1/2 tab daily for 1 week and than 1 tab adily 30 tablet 1  . aspirin 81 MG EC tablet Take 1 tablet (81 mg total) by mouth daily. Swallow whole. 90 tablet 0  . atorvastatin (LIPITOR) 80 MG tablet Take 80 mg by mouth 2 (two) times daily.    [name] escitalopram (LEXAPRO) 20 MG tablet Take 1 tablet (20 mg total) by mouth daily. 30 tablet 1  . fluticasone furoate-vilanterol (BREO ELLIPTA) 100-25 MCG/INH AEPB INHALE 1 PUFF EVERY DAY 180 each 0  . gabapentin (NEURONTIN) 600 MG tablet Take 1 tablet (600 mg total) by mouth 3 (three) times daily. 270 tablet 0  . guaiFENesin (MUCINEX) 600 MG 12 hr tablet Take 1 tablet (600 mg total) by mouth 2 (two) times daily. 30 tablet 0  . metFORMIN (GLUCOPHAGE-XR) 500 MG 24 hr tablet Take 1 tablet (500 mg total) by mouth 2 (two) times daily. 180 tablet 0  . metolazone (ZAROXOLYN) 2.5 MG tablet Take 1 tablet 3 days per week (Mondays, Wednesdays, and Fridays). Take 30 minutes before taking Torsemide 45 tablet 3  . mirtazapine (REMERON SOL-TAB)  30 MG disintegrating tablet Take 1 tablet (30 mg total) by mouth at bedtime. 30 tablet 1  . montelukast (SINGULAIR) 10 MG tablet Take 1 tablet (10 mg total) by mouth at bedtime. 90 tablet 1  . Multiple Vitamin (MULTIVITAMIN WITH MINERALS) TABS tablet Take 1 tablet by mouth daily.    [name] omeprazole (PRILOSEC) 40 MG capsule Take 1 capsule (40 mg total) by mouth 2 (two) times daily. 180 capsule 0  . OXYGEN Inhale 6 L into the lungs daily.    . potassium chloride (K-DUR) 10 MEQ tablet Take 5 tablets (50 mEq total) by mouth daily. 60 tablet 1  . tiotropium (SPIRIVA HANDIHALER) 18 MCG inhalation capsule Place 1 capsule (18 mcg total) into inhaler and inhale daily. 90 capsule 1  . torsemide (DEMADEX) 20 MG tablet Take 4 tablets (80 mg total) by mouth 2 (two) times daily. 720 tablet 3  . Trolamine Salicylate 10 % LOTN Apply 1 patch topically 3 (three) times daily as needed. 141 g 2   No current facility-administered medications for this visit.      Musculoskeletal: Strength & Muscle Tone: within normal limits Gait & Station: unsteady Patient leans: Left, Front and Backward  Psychiatric Specialty Exam: Review of Systems  Constitutional: Positive for malaise/fatigue.  Respiratory: Positive for shortness of breath.   Cardiovascular: Positive for palpitations.  Musculoskeletal: Positive for back pain and joint pain.       Patient wearing cast on her right leg  Skin: Negative.   Neurological: Positive for tingling and weakness.  Psychiatric/Behavioral: The patient is nervous/anxious.     Blood pressure 138/90, pulse 88, height 5\' 4"  (1.626 m), weight 298 lb 12.8 oz (  135.5 kg).Body mass index is 51.29 kg/m.  General Appearance: Casual and using oxygen  Eye Contact:  Fair  Speech:  Slow  Volume:  Decreased  Mood:  tired and exhausted  Affect:  Congruent  Thought Process:  Goal Directed  Orientation:  Full (Time, Place, and Person)  Thought Content: Rumination   Suicidal Thoughts:  No   Homicidal Thoughts:  No  Memory:  Immediate;   Fair Recent;   Fair Remote;   Fair  Judgement:  Good  Insight:  Good  Psychomotor Activity:  Decreased  Concentration:  Concentration: Fair and Attention Span: Fair  Recall:  [employer] of Knowledge: Fair  Language: Fair  Akathisia:  No  Handed:  Right  AIMS (if indicated): not done  Assets:  Communication Skills Desire for Improvement  ADL's:  Intact  Cognition: WNL  Sleep:  Fair   Screenings: AUDIT     Admission (Discharged) from 12/01/2015 in [place]  Alcohol Use Disorder Identification Test Final Score (AUDIT)  0    PHQ2-9     Office Visit from 11/06/2016 in [place] Office Visit from 10/31/2016 in [place] Office Visit from 10/26/2016 in [place] Office Visit from 10/25/2016 in [place] Office Visit from 10/18/2016 in [place]  PHQ-2 Total Score  0  0  0  0  0       Assessment and Plan: is schizophrenia chronic paranoid type.  Rule out bipolar disorder type I.  Cocaine dependence in complete remission.  I review her blood work results, discharge summary from the hospital and collateral information.  Her creatinine is still slightly high.  We discussed in length about her physical condition and limited choice of medication.  Even though she feels starting [name] she had gained weight but her weight is seen since the last visit.  She like to try a different medication.  I will try Abilify which helped her in the past.  We will discontinue [name] and Geodon.  She will continue Remeron 30 mg at bedtime.  One more time I encouraged to see therapist for coping skills.  She had appointment on 31st to see Janett Billow.  Discuss safety concern that anytime having active suicidal thoughts or homicidal thoughts and she need to call 911 or go to the local emergency room.  Follow-up in 6 weeks.  I also suggested that she should see primary care physician for weight loss management. Time spent 25 minutes.   ARFEEN,SYED T., MD 11/08/2016, 8:06 AM

## 2016-11-09 ENCOUNTER — Ambulatory Visit: Payer: Self-pay | Admitting: Internal Medicine

## 2016-11-09 ENCOUNTER — Encounter: Payer: Self-pay | Admitting: Cardiology

## 2016-11-09 ENCOUNTER — Ambulatory Visit (INDEPENDENT_AMBULATORY_CARE_PROVIDER_SITE_OTHER): Payer: Medicare HMO | Admitting: Cardiology

## 2016-11-09 VITALS — BP 122/90 | HR 86 | Ht 64.0 in

## 2016-11-09 DIAGNOSIS — I10 Essential (primary) hypertension: Secondary | ICD-10-CM

## 2016-11-09 DIAGNOSIS — R0989 Other specified symptoms and signs involving the circulatory and respiratory systems: Secondary | ICD-10-CM | POA: Diagnosis not present

## 2016-11-09 DIAGNOSIS — M6281 Muscle weakness (generalized): Secondary | ICD-10-CM | POA: Diagnosis not present

## 2016-11-09 DIAGNOSIS — R062 Wheezing: Secondary | ICD-10-CM | POA: Diagnosis not present

## 2016-11-09 DIAGNOSIS — I5032 Chronic diastolic (congestive) heart failure: Secondary | ICD-10-CM | POA: Diagnosis not present

## 2016-11-09 DIAGNOSIS — J45909 Unspecified asthma, uncomplicated: Secondary | ICD-10-CM | POA: Diagnosis not present

## 2016-11-09 DIAGNOSIS — J9620 Acute and chronic respiratory failure, unspecified whether with hypoxia or hypercapnia: Secondary | ICD-10-CM | POA: Diagnosis not present

## 2016-11-09 DIAGNOSIS — J189 Pneumonia, unspecified organism: Secondary | ICD-10-CM | POA: Diagnosis not present

## 2016-11-09 DIAGNOSIS — G4733 Obstructive sleep apnea (adult) (pediatric): Secondary | ICD-10-CM | POA: Diagnosis not present

## 2016-11-09 DIAGNOSIS — S82831S Other fracture of upper and lower end of right fibula, sequela: Secondary | ICD-10-CM | POA: Diagnosis not present

## 2016-11-09 DIAGNOSIS — R0602 Shortness of breath: Secondary | ICD-10-CM | POA: Diagnosis not present

## 2016-11-09 DIAGNOSIS — J449 Chronic obstructive pulmonary disease, unspecified: Secondary | ICD-10-CM | POA: Diagnosis not present

## 2016-11-09 DIAGNOSIS — S82221S Displaced transverse fracture of shaft of right tibia, sequela: Secondary | ICD-10-CM | POA: Diagnosis not present

## 2016-11-09 DIAGNOSIS — J440 Chronic obstructive pulmonary disease with acute lower respiratory infection: Secondary | ICD-10-CM | POA: Diagnosis not present

## 2016-11-09 DIAGNOSIS — I509 Heart failure, unspecified: Secondary | ICD-10-CM | POA: Diagnosis not present

## 2016-11-09 HISTORY — DX: Other specified symptoms and signs involving the circulatory and respiratory systems: R09.89

## 2016-11-09 NOTE — Progress Notes (Signed)
Cardiology Office Note:    Date:  11/09/2016   ID:  Tiffany [name], DOB 10-Jan-1961, MRN [id number]  PCP:  [name], MD  Cardiologist:  Fransico Him, MD   Referring MD: [name], MD   Chief Complaint  Patient presents with  . Congestive Heart Failure    History of Present Illness:    Tiffany [name] is a 56 y.o. female with a hx of asthma, COPD on chronic supplemental O2, chronic diastolic CHF, prior MI, HTN, hyperlipidemia, type 2 diabetes, history of cocaine abuse, CKD stage II, anemiaand anxiety.  Her last 2D echo showed normal LVF with EF 60-65% with mild LAE. She was seen last week with complaints of 7lb weight gain with acute on chronic diastolic CHF.  Zaroxolyn 2.5mg  Mon/Wed/Fri was added and she is now here for followup.  [name] on chronic O2 at home for her COPD and chronic respiratory failure.  After my PA saw her she started having CP along with worsening edema and was checked by the home health nurse and sent her to the hospital.  She says that her chest discomfort was a dull ache.  Trop was negative and no PE was noted on CTA.  Cxray was clear.  It was felt that her pain was likely due to MSK pain because it was reproducible with palpation.    [name] back here today for followup and is doing better but is very concerned that she keeps retaining fluid.  When questioned about her diet she denies putting salt on her food but she eats meals out at least once daily including hamburgers, hot dogs, McD[employer] salads and [place] food.  She weighs daily and her dry weight is 298lbs.  This am she was at her dry weight.  She denies any more chest pain or pressure.  She has chronic stable SOB on 6L [place] with walking in the house and getting out of bed.  She sleeps on 2 pillows as well as a raied hospital bed. She has chronic LE edema and this is monitored by her home health nurse.  She denies any dizziness, palpitations or syncope. [name] compliant with her meds  and is tolerating meds with no SE.    Past Medical History:  Diagnosis Date  . Anxiety   . Arthritis    "all over" (04/10/2016)  . Asthma   . Cardiac arrest ([place]) 09/08/2016   PEA  . Chronic bronchitis ([place])   . Chronic diastolic (congestive) heart failure ([place])   . Chronic kidney disease    "I see a kidney dr." (04/10/2016)  . Cocaine abuse ([place])   . Complication of anesthesia    decreased bp, decreased heart rate  . Depression   . Disorder of nervous system   . Emphysema   . GERD (gastroesophageal reflux disease)   . Heart attack ([place]) 1980s  . History of blood transfusion 1994   "couldn't stop bleeding from my period"  . Hyperlipidemia LDL goal <70   . Hypertension   . Incontinence   . Manic depression ([place])   . On home oxygen therapy    "6L; 24/7" (04/10/2016)  . OSA on CPAP    "wear mask sometimes" (04/10/2016)  . Paranoid ([place])    "sometimes; I'm on RX for it" (04/10/2016)  . Pneumonia    "I've had it several times; haven't had it since 06/2015" (04/10/2016)  . Schizophrenia ([place])   . Seasonal allergies   . Seizures ([place])    "  don't know what kind; last one was ~ 1 yr ago" (04/10/2016)  . Sinus trouble   . Stroke [facility]) 1980s   denies residual on 04/10/2016  . Type II diabetes mellitus ([place])     Past Surgical History:  Procedure Laterality Date  . CESAREAN SECTION  1997  . HERNIA REPAIR    . IR CHOLANGIOGRAM EXISTING TUBE  07/20/2016  . IR PERC CHOLECYSTOSTOMY  05/10/2016  . IR RADIOLOGIST EVAL & MGMT  06/08/2016  . IR SINUS/FIST TUBE CHK-NON GI  07/12/2016  . TIBIA IM NAIL INSERTION Right 07/12/2016   Procedure: INTRAMEDULLARY (IM) NAIL RIGHT TIBIA;  Surgeon: Leandrew Koyanagi, MD;  Location: [facility];  Service: Orthopedics;  Laterality: Right;  . UMBILICAL HERNIA REPAIR  ~ 1963   "that's why I don't have a belly button"  . VAGINAL HYSTERECTOMY      Current Medications: Current Meds  Medication Sig  . albuterol (PROVENTIL) (2.5 MG/3ML) 0.083% nebulizer solution Take 3  mLs (2.5 mg total) by nebulization every 6 (six) hours as needed for wheezing or shortness of breath.  [name] amLODipine (NORVASC) 5 MG tablet Take 1 tablet (5 mg total) by mouth daily.  . ARIPiprazole (ABILIFY) 5 MG tablet Take 1/2 tab daily for 1 week and than 1 tab adily  . aspirin 81 MG EC tablet Take 1 tablet (81 mg total) by mouth daily. Swallow whole.  [name] atorvastatin (LIPITOR) 80 MG tablet Take 80 mg by mouth 2 (two) times daily.  [name] escitalopram (LEXAPRO) 20 MG tablet Take 1 tablet (20 mg total) by mouth daily.  . fluticasone furoate-vilanterol (BREO ELLIPTA) 100-25 MCG/INH AEPB INHALE 1 PUFF EVERY DAY  . gabapentin (NEURONTIN) 600 MG tablet Take 1 tablet (600 mg total) by mouth 3 (three) times daily.  [name] guaiFENesin (MUCINEX) 600 MG 12 hr tablet Take 1 tablet (600 mg total) by mouth 2 (two) times daily.  . metFORMIN (GLUCOPHAGE-XR) 500 MG 24 hr tablet Take 1 tablet (500 mg total) by mouth 2 (two) times daily.  . metolazone (ZAROXOLYN) 2.5 MG tablet Take 1 tablet 3 days per week (Mondays, Wednesdays, and Fridays). Take 30 minutes before taking Torsemide  . mirtazapine (REMERON SOL-TAB) 30 MG disintegrating tablet Take 1 tablet (30 mg total) by mouth at bedtime.  . montelukast (SINGULAIR) 10 MG tablet Take 1 tablet (10 mg total) by mouth at bedtime.  . Multiple Vitamin (MULTIVITAMIN WITH MINERALS) TABS tablet Take 1 tablet by mouth daily.  [name] omeprazole (PRILOSEC) 40 MG capsule Take 1 capsule (40 mg total) by mouth 2 (two) times daily.  . OXYGEN Inhale 6 L into the lungs daily.  . potassium chloride (K-DUR) 10 MEQ tablet Take 5 tablets (50 mEq total) by mouth daily.  [name] tiotropium (SPIRIVA HANDIHALER) 18 MCG inhalation capsule Place 1 capsule (18 mcg total) into inhaler and inhale daily.  [name] torsemide (DEMADEX) 20 MG tablet Take 4 tablets (80 mg total) by mouth 2 (two) times daily.  Loura Pardon Salicylate 10 % LOTN Apply 1 patch topically 3 (three) times daily as needed.     Allergies:    Hydrocodone-acetaminophen; Hydroxyzine; Latuda [lurasidone hcl]; Codeine; and Sulfa antibiotics   Social History   Social History  . Marital status: Widowed    Spouse name: N/A  . Number of children: 3  . Years of education: N/A   Occupational History  . disabled     factory production   Social History Main Topics  . Smoking status: Former Smoker  Packs/day: 1.50    Years: 38.00    Types: Cigarettes    Start date: 03/13/1977    Quit date: 04/10/2016  . Smokeless tobacco: Never Used  . Alcohol use No  . Drug use: No     Comment: 04/10/2016 "last used cocaine back in November 2017"  . Sexual activity: No   Other Topics Concern  . Not on file   Social History Narrative   Has 1 son, [name]   Lives with son and his boyfriend   Her house has ramps and handrails should she ever needs them.    Her mother lives down the street from her and is a good support person in addition to her son.   She drives herself, has private transportation.    Cocaine free since 02/24/16, smoke free since 04/10/16        Family History: The patient's family history includes Anxiety disorder in her sister; Bipolar disorder in her sister; Cancer in her father and mother; Depression in her brother, mother, sister, and sister; Heart failure in her unknown relative; Schizophrenia in her sister.  ROS:   Please see the history of present illness.    ROS  All other systems reviewed and negative.   EKGs/Labs/Other Studies Reviewed:    The following studies were reviewed today: none  EKG:  EKG is not ordered today.    Recent Labs: 03/15/2016: TSH 0.563 06/26/2016: NT-Pro BNP 57 10/07/2016: Magnesium 1.8 11/03/2016: ALT 17; B Natriuretic Peptide 12.0; Hemoglobin 9.7; Platelets 322 11/06/2016: BUN 31; Creatinine, Ser 1.40; Potassium 4.0; Sodium 149   Recent Lipid Panel    Component Value Date/Time   CHOL 113 07/10/2016 0352   TRIG 91 07/10/2016 0352   HDL 51 07/10/2016 0352   CHOLHDL 2.2 07/10/2016  0352   VLDL 18 07/10/2016 0352   LDLCALC 44 07/10/2016 0352   LDLDIRECT 52 04/05/2012 1210    Physical Exam:    VS:  BP 122/90   Pulse 86   Ht 5\' 4"  (1.626 m)   SpO2 98%     Wt Readings from Last 3 Encounters:  11/06/16 298 lb 9.6 oz (135.4 kg)  11/05/16 (!) 300 lb 6.4 oz (136.3 kg)  11/02/16 (!) 303 lb (137.4 kg)     GEN:  Well nourished, well developed in no acute distress HEENT: Normal NECK: No JVD; right carotid bruit LYMPHATICS: No lymphadenopathy CARDIAC: RRR, no murmurs, rubs, gallops RESPIRATORY:  Clear to auscultation without rales, wheezing or rhonchi  ABDOMEN: Soft, non-tender, non-distended MUSCULOSKELETAL:  No edema; No deformity  SKIN: Warm and dry NEUROLOGIC:  Alert and oriented x 3 PSYCHIATRIC:  Normal affect   ASSESSMENT:    1. Chronic diastolic congestive heart failure ([place])   2. Essential hypertension   3. Right carotid bruit    PLAN:    In order of problems listed above:  1.  Chronic diastolic CHF - She was recently hospitalized with chest pain that was felt to be musculoskeletal and troponin, cxray and echo were normal.  Her weight is stable at a dry weight of 298lbs.  I think that most of her problem is dietary indiscretion with increased sodium intake in foods she is unaware of especially when eating out.  She will continue on Torsemide 80mg  BID and Zaroxolyn 2.5mg  three days weekly.    2.  HTN - BP is well controlled on exam today.  She will continue on amlodipine 5mg  daily.    3.  Right carotid artery bruit - I  will get a carotid doppler to evaluate   Medication Adjustments/Labs and Tests Ordered: Current medicines are reviewed at length with the patient today.  Concerns regarding medicines are outlined above.  No orders of the defined types were placed in this encounter.  No orders of the defined types were placed in this encounter.   Signed, Fransico Him, MD  11/09/2016 1:41 PM    [place]

## 2016-11-09 NOTE — Patient Instructions (Signed)
Medication Instructions:  Your physician recommends that you continue on your current medications as directed. Please refer to the Current Medication list given to you today.  Labwork: BMET TODAY   Testing/Procedures: Your physician has requested that you have a carotid duplex. This test is an ultrasound of the carotid arteries in your neck. It looks at blood flow through these arteries that supply the brain with blood. Allow one hour for this exam. There are no restrictions or special instructions. [facility]  Follow-Up: Your physician recommends that you schedule a follow-up appointment in: PA/NP IN 3 MONTHS  Your physician wants you to follow-up in: [place] will receive a reminder letter in the mail two months in advance. If you don't receive a letter, please call our office to schedule the follow-up appointment.  Any Other Special Instructions Will Be Listed Below (If Applicable). WILL ARRANGE FOR YOU TO SEE NURSE AND NUTRITIONIST AT HEART FAILURE CLINIC    LIMIT YOUR FLUID INTAKE TO 1500 CC (50 OUNCES)

## 2016-11-09 NOTE — Addendum Note (Signed)
Addended by: Alvina Filbert B on: 11/09/2016 01:53 PM   Modules accepted: Orders

## 2016-11-10 ENCOUNTER — Other Ambulatory Visit: Payer: Self-pay | Admitting: Internal Medicine

## 2016-11-10 LAB — BASIC METABOLIC PANEL
BUN/Creatinine Ratio: 29 — ABNORMAL HIGH (ref 9–23)
BUN: 43 mg/dL — ABNORMAL HIGH (ref 6–24)
CO2: 40 mmol/L — ABNORMAL HIGH (ref 20–29)
Calcium: 10.3 mg/dL — ABNORMAL HIGH (ref 8.7–10.2)
Chloride: 86 mmol/L — ABNORMAL LOW (ref 96–106)
Creatinine, Ser: 1.46 mg/dL — ABNORMAL HIGH (ref 0.57–1.00)
GFR calc Af Amer: 46 mL/min/{1.73_m2} — ABNORMAL LOW (ref >59)
GFR calc non Af Amer: 40 mL/min/{1.73_m2} — ABNORMAL LOW (ref >59)
Glucose: 90 mg/dL (ref 65–99)
Potassium: 3.2 mmol/L — ABNORMAL LOW (ref 3.5–5.2)
Sodium: 146 mmol/L — ABNORMAL HIGH (ref 134–144)

## 2016-11-10 MED ORDER — AMLODIPINE BESYLATE 5 MG PO TABS: 5.0000 mg | ORAL_TABLET | Freq: Every day | ORAL | 0 refills | Status: DC

## 2016-11-10 MED ORDER — METFORMIN HCL ER 500 MG PO TB24: 500.0000 mg | ORAL_TABLET | Freq: Two times a day (BID) | ORAL | 0 refills | Status: DC

## 2016-11-13 ENCOUNTER — Other Ambulatory Visit (HOSPITAL_COMMUNITY): Payer: Self-pay | Admitting: Psychiatry

## 2016-11-13 DIAGNOSIS — F200 Paranoid schizophrenia: Secondary | ICD-10-CM

## 2016-11-14 ENCOUNTER — Other Ambulatory Visit (HOSPITAL_COMMUNITY): Payer: Self-pay | Admitting: Psychiatry

## 2016-11-14 ENCOUNTER — Encounter (HOSPITAL_BASED_OUTPATIENT_CLINIC_OR_DEPARTMENT_OTHER): Payer: Medicare HMO

## 2016-11-14 DIAGNOSIS — R0602 Shortness of breath: Secondary | ICD-10-CM | POA: Diagnosis not present

## 2016-11-14 NOTE — Telephone Encounter (Signed)
Prescription will be given on her appointment.  [name] no longer taking [name]

## 2016-11-15 ENCOUNTER — Ambulatory Visit: Payer: Self-pay | Admitting: Internal Medicine

## 2016-11-15 ENCOUNTER — Encounter: Payer: Self-pay | Admitting: Internal Medicine

## 2016-11-15 ENCOUNTER — Ambulatory Visit (INDEPENDENT_AMBULATORY_CARE_PROVIDER_SITE_OTHER): Payer: Medicare HMO | Admitting: Internal Medicine

## 2016-11-15 ENCOUNTER — Ambulatory Visit (HOSPITAL_COMMUNITY): Payer: Self-pay | Admitting: Licensed Clinical Social Worker

## 2016-11-15 VITALS — BP 102/64 | HR 85 | Temp 99.0°F | Ht 64.0 in | Wt 295.0 lb

## 2016-11-15 DIAGNOSIS — I10 Essential (primary) hypertension: Secondary | ICD-10-CM

## 2016-11-15 DIAGNOSIS — I5032 Chronic diastolic (congestive) heart failure: Secondary | ICD-10-CM

## 2016-11-15 DIAGNOSIS — M79671 Pain in right foot: Secondary | ICD-10-CM

## 2016-11-15 NOTE — Progress Notes (Signed)
   [name] [name], MD Phone: [phone]  Reason For Visit: Follow up    # SOB/Dyspnea  - Has improved SOB and dyspnea  - Her torsemide twice daily once in the morning once at night  - Also taking metolazone 2.5 mg Monday Wednesday Friday - Patient has been watching her salt intake  - Patient has stopped Depokate  - Patient has not been doing physical therapy and would like to restart physical therapy - Patient has been urinating about 1L and half   #Right Foot Pain  - Patient states that she dropped the oxygen tank on her right foot about 1 week ago.  Initially there was a significant amount of swelling on her right foot however this has gone down significantly.  She does note some bruising now on her foot.  She denies any changes in sensation or range of motion.  Past Medical History Reviewed problem list.  Medications- reviewed and updated No additions to family history Social history- patient is a previous smoker  Objective: BP 102/64   Pulse 85   Temp 99 F (37.2 C) (Oral)   Ht 5\' 4"  (1.626 m)   Wt 295 lb (133.8 kg)   SpO2 95% Comment: on 6 liters  BMI 50.64 kg/m  Gen: NAD, alert, cooperative with exam Cardio: regular rate and rhythm, S1S2 heard, no murmurs appreciated Pulm: clear to auscultation bilaterally, no wheezes, rhonchi or rales Musculoskeletal: No lower extremity edema Right foot with pain on palpation of medial metatarsals, minimal bruising noted, no significant swelling, foot warm and well vascularized, normal range of motion  Assessment/Plan: See problem based a/p  Chronic diastolic congestive heart failure (HCC) Well controlled, below dry weight of 298lbs  - Continue torsemide 80 mg BID  - Will decrease metalzone to once weekly  - Will recheck BMET  - Benefit from physical therapy given likely deconditioning  - Follow up in 1 week    Right foot pain Right foot pain and bruising 1 week following dropping any  oxygen tank on patient's foot. Unlikely fracture, pain improving. However will check for fracture  - DG Foot Complete Right; Future

## 2016-11-15 NOTE — Assessment & Plan Note (Addendum)
Well controlled, below dry weight of 298lbs  - Continue torsemide 80 mg BID  - Will decrease metalzone to once weekly  - Will recheck BMET  - Benefit from physical therapy given likely deconditioning  - Follow up in 1 week

## 2016-11-15 NOTE — Patient Instructions (Addendum)
We will check you for a fracture in right foot. Please go to [facility] for [facility] x[facility]. I will check a BMET today. Decrease the Metalzone to Tuesday only per cardiology. Follow up  1 week with me.  We will place an order for physical therapy as well

## 2016-11-15 NOTE — Assessment & Plan Note (Signed)
Right foot pain and bruising 1 week following dropping any oxygen tank on patient's foot. Unlikely fracture, pain improving. However will check for fracture  - DG Foot Complete Right; Future

## 2016-11-16 ENCOUNTER — Ambulatory Visit (HOSPITAL_COMMUNITY)
Admission: RE | Admit: 2016-11-16 | Discharge: 2016-11-16 | Disposition: A | Discharge status: Home / Self Care | Payer: Medicare HMO | Source: Ambulatory Visit | Attending: Internal Medicine | Admitting: Internal Medicine

## 2016-11-16 DIAGNOSIS — R0989 Other specified symptoms and signs involving the circulatory and respiratory systems: Secondary | ICD-10-CM | POA: Diagnosis present

## 2016-11-16 LAB — BASIC METABOLIC PANEL
BUN/Creatinine Ratio: 26 — ABNORMAL HIGH (ref 9–23)
BUN: 48 mg/dL — ABNORMAL HIGH (ref 6–24)
CO2: 36 mmol/L — ABNORMAL HIGH (ref 20–29)
Calcium: 10.1 mg/dL (ref 8.7–10.2)
Chloride: 91 mmol/L — ABNORMAL LOW (ref 96–106)
Creatinine, Ser: 1.87 mg/dL — ABNORMAL HIGH (ref 0.57–1.00)
GFR calc Af Amer: 34 mL/min/{1.73_m2} — ABNORMAL LOW (ref >59)
GFR calc non Af Amer: 29 mL/min/{1.73_m2} — ABNORMAL LOW (ref >59)
Glucose: 99 mg/dL (ref 65–99)
Potassium: 3.5 mmol/L (ref 3.5–5.2)
Sodium: 145 mmol/L — ABNORMAL HIGH (ref 134–144)

## 2016-11-18 ENCOUNTER — Encounter: Payer: Self-pay | Admitting: Cardiology

## 2016-11-18 DIAGNOSIS — I6529 Occlusion and stenosis of unspecified carotid artery: Secondary | ICD-10-CM | POA: Insufficient documentation

## 2016-11-21 ENCOUNTER — Other Ambulatory Visit: Payer: Self-pay | Admitting: Internal Medicine

## 2016-11-21 DIAGNOSIS — R0602 Shortness of breath: Secondary | ICD-10-CM | POA: Diagnosis not present

## 2016-11-21 DIAGNOSIS — G4733 Obstructive sleep apnea (adult) (pediatric): Secondary | ICD-10-CM | POA: Diagnosis not present

## 2016-11-21 DIAGNOSIS — J189 Pneumonia, unspecified organism: Secondary | ICD-10-CM | POA: Diagnosis not present

## 2016-11-21 DIAGNOSIS — M6281 Muscle weakness (generalized): Secondary | ICD-10-CM | POA: Diagnosis not present

## 2016-11-21 DIAGNOSIS — R062 Wheezing: Secondary | ICD-10-CM | POA: Diagnosis not present

## 2016-11-21 DIAGNOSIS — I509 Heart failure, unspecified: Secondary | ICD-10-CM | POA: Diagnosis not present

## 2016-11-21 DIAGNOSIS — J440 Chronic obstructive pulmonary disease with acute lower respiratory infection: Secondary | ICD-10-CM | POA: Diagnosis not present

## 2016-11-21 DIAGNOSIS — J45909 Unspecified asthma, uncomplicated: Secondary | ICD-10-CM | POA: Diagnosis not present

## 2016-11-21 DIAGNOSIS — J449 Chronic obstructive pulmonary disease, unspecified: Secondary | ICD-10-CM | POA: Diagnosis not present

## 2016-11-21 MED ORDER — ALBUTEROL SULFATE (2.5 MG/3ML) 0.083% IN NEBU: 2.5000 mg | INHALATION_SOLUTION | Freq: Four times a day (QID) | RESPIRATORY_TRACT | 1 refills | Status: DC | PRN

## 2016-11-21 MED ORDER — AMLODIPINE BESYLATE 5 MG PO TABS: 5.0000 mg | ORAL_TABLET | Freq: Every day | ORAL | 0 refills | Status: DC

## 2016-11-21 MED ORDER — FLUTICASONE FUROATE-VILANTEROL 100-25 MCG/INH IN AEPB: INHALATION_SPRAY | RESPIRATORY_TRACT | 0 refills | Status: DC

## 2016-11-21 MED ORDER — ATORVASTATIN CALCIUM 80 MG PO TABS: 80.0000 mg | ORAL_TABLET | Freq: Every day | ORAL | 1 refills | Status: DC

## 2016-11-23 ENCOUNTER — Encounter: Payer: Self-pay | Admitting: Interventional Radiology

## 2016-11-23 ENCOUNTER — Other Ambulatory Visit: Payer: Self-pay

## 2016-11-23 ENCOUNTER — Ambulatory Visit (INDEPENDENT_AMBULATORY_CARE_PROVIDER_SITE_OTHER): Payer: Medicare HMO | Admitting: Internal Medicine

## 2016-11-23 ENCOUNTER — Ambulatory Visit (INDEPENDENT_AMBULATORY_CARE_PROVIDER_SITE_OTHER): Payer: Medicare HMO | Admitting: Orthopaedic Surgery

## 2016-11-23 DIAGNOSIS — I5030 Unspecified diastolic (congestive) heart failure: Secondary | ICD-10-CM | POA: Diagnosis not present

## 2016-11-23 DIAGNOSIS — I130 Hypertensive heart and chronic kidney disease with heart failure and stage 1 through stage 4 chronic kidney disease, or unspecified chronic kidney disease: Secondary | ICD-10-CM | POA: Diagnosis not present

## 2016-11-23 DIAGNOSIS — D631 Anemia in chronic kidney disease: Secondary | ICD-10-CM | POA: Diagnosis not present

## 2016-11-23 DIAGNOSIS — J441 Chronic obstructive pulmonary disease with (acute) exacerbation: Secondary | ICD-10-CM | POA: Diagnosis not present

## 2016-11-23 DIAGNOSIS — I5032 Chronic diastolic (congestive) heart failure: Secondary | ICD-10-CM

## 2016-11-23 DIAGNOSIS — E1140 Type 2 diabetes mellitus with diabetic neuropathy, unspecified: Secondary | ICD-10-CM | POA: Diagnosis not present

## 2016-11-23 DIAGNOSIS — E1122 Type 2 diabetes mellitus with diabetic chronic kidney disease: Secondary | ICD-10-CM | POA: Diagnosis not present

## 2016-11-23 DIAGNOSIS — S82201D Unspecified fracture of shaft of right tibia, subsequent encounter for closed fracture with routine healing: Secondary | ICD-10-CM | POA: Diagnosis not present

## 2016-11-23 DIAGNOSIS — N182 Chronic kidney disease, stage 2 (mild): Secondary | ICD-10-CM | POA: Diagnosis not present

## 2016-11-23 DIAGNOSIS — I272 Pulmonary hypertension, unspecified: Secondary | ICD-10-CM | POA: Diagnosis not present

## 2016-11-23 MED ORDER — METOLAZONE 2.5 MG PO TABS: ORAL_TABLET | ORAL | 3 refills | Status: DC

## 2016-11-23 MED ORDER — POTASSIUM CHLORIDE ER 10 MEQ PO TBCR: 50.0000 meq | EXTENDED_RELEASE_TABLET | Freq: Every day | ORAL | 1 refills | Status: DC

## 2016-11-23 MED ORDER — ALBUTEROL SULFATE HFA 108 (90 BASE) MCG/ACT IN AERS
2.0000 | INHALATION_SPRAY | Freq: Four times a day (QID) | RESPIRATORY_TRACT | 0 refills | Status: DC | PRN
Start: 2016-11-23 — End: 2016-12-20

## 2016-11-23 NOTE — Patient Instructions (Signed)
It was nice seeing you again today Ms. Milillo!  Please begin taking metolazone two times a week (Tuesdays and Fridays). You can take a dose tomorrow.   Continue taking five tablets of KDur (potassium) per day as you have been.   We will see you back in one week to make sure your swelling is under control.   If you have any questions or concerns, please feel free to call the clinic.   Be well,  Dr. Avon Gully

## 2016-11-23 NOTE — Assessment & Plan Note (Signed)
Weight up from dry weight (298lb) at 301lb today (increased 6 pounds from 10/31). Respiratory status not worsened, however patient generally feels more swollen. Trace to 1+ pitting edema to midshin, R>L. Given weight increase of 6 lbs, will increase metolazone from once weekly to twice weekly (Tuesdays and Fridays).  - Increase metolazone to Tues and Fri - F/u in one week - Continue torsemide 80mg  BID - Continue KDur 5 tabs daily (confirmed with patient that she should still be taking this as there was apparently a mix up at the pharmacy)

## 2016-11-23 NOTE — Progress Notes (Signed)
   Subjective:   Patient: Tiffany [name]       Birthdate: 1960/05/27       MRN: [id number]      HPI  Tiffany [name] is a 56 y.o. female presenting for f/u of SOB/dyspnea.   Patient last seen on 10/31 for follow-up of fluid status. She weighed 295lb at that visit, three pounds below her dry weight of 298lb. Her torsemide was continued at current dose of 80mg  BID, and metolazone was decreased from 2.5 mg MWF to 2.5 mg on Tuesdays alone.   Patient says that since metolazone was decreased, she feels as if her weight is up and legs are more swollen than before. She says her breathing has not changed, and she is still breathing well on her home 6L O2 by [place]. She says she is generally feeling well, she just feels more swollen and is interested in possibly increasing metolazone. [name] very proud that she has not been admitted to the hospital for three weeks and would like to stay out as long as possible.   Smoking status reviewed. Patient is former smoker.   Review of Systems See HPI.     Objective:  Physical Exam  Constitutional: [name] oriented to person, place, and time.  Very pleasant obese female sitting in wheelchair in NAD  HENT:  Head: Normocephalic and atraumatic.  Eyes: Conjunctivae and EOM are normal. Right eye exhibits no discharge. Left eye exhibits no discharge.  Cardiovascular: Normal rate, regular rhythm and normal heart sounds.  No murmur heard. Trace to 1+ pitting edema to midshin on LLE; trace pitting edema on RLE.   Pulmonary/Chest: Effort normal. No respiratory distress.  Normal WOB on 6L O2 [place]. Faint wheezes bilaterally. Able to speak in full sentences without difficulty.   Neurological: [name] alert and oriented to person, place, and time.  Psychiatric: Affect and judgment normal.      Assessment & Plan:  Chronic diastolic congestive heart failure (HCC) Weight up from dry weight (298lb) at 301lb today (increased 6 pounds from 10/31). Respiratory status not worsened,  however patient generally feels more swollen. Trace to 1+ pitting edema to midshin, R>L. Given weight increase of 6 lbs, will increase metolazone from once weekly to twice weekly (Tuesdays and Fridays).  - Increase metolazone to Tues and Fri - F/u in one week - Continue torsemide 80mg  BID - Continue KDur 5 tabs daily (confirmed with patient that she should still be taking this as there was apparently a mix up at the pharmacy) - Precepted with Dr. Erin Hearing.   Tiffany Hector, MD, MPH PGY-3 [facility] Pager [phone]

## 2016-11-24 DIAGNOSIS — R0602 Shortness of breath: Secondary | ICD-10-CM | POA: Diagnosis not present

## 2016-11-24 DIAGNOSIS — J440 Chronic obstructive pulmonary disease with acute lower respiratory infection: Secondary | ICD-10-CM | POA: Diagnosis not present

## 2016-11-24 DIAGNOSIS — J45909 Unspecified asthma, uncomplicated: Secondary | ICD-10-CM | POA: Diagnosis not present

## 2016-11-24 DIAGNOSIS — J189 Pneumonia, unspecified organism: Secondary | ICD-10-CM | POA: Diagnosis not present

## 2016-11-24 DIAGNOSIS — R062 Wheezing: Secondary | ICD-10-CM | POA: Diagnosis not present

## 2016-11-24 DIAGNOSIS — G4733 Obstructive sleep apnea (adult) (pediatric): Secondary | ICD-10-CM | POA: Diagnosis not present

## 2016-11-27 ENCOUNTER — Ambulatory Visit (INDEPENDENT_AMBULATORY_CARE_PROVIDER_SITE_OTHER): Payer: Medicare HMO | Admitting: Orthopaedic Surgery

## 2016-11-27 ENCOUNTER — Encounter: Payer: Self-pay | Admitting: Family Medicine

## 2016-11-27 ENCOUNTER — Ambulatory Visit (INDEPENDENT_AMBULATORY_CARE_PROVIDER_SITE_OTHER): Payer: Medicare HMO

## 2016-11-27 ENCOUNTER — Encounter (INDEPENDENT_AMBULATORY_CARE_PROVIDER_SITE_OTHER): Payer: Self-pay | Admitting: Orthopaedic Surgery

## 2016-11-27 ENCOUNTER — Ambulatory Visit (INDEPENDENT_AMBULATORY_CARE_PROVIDER_SITE_OTHER): Payer: Medicare HMO | Admitting: Family Medicine

## 2016-11-27 DIAGNOSIS — S82201A Unspecified fracture of shaft of right tibia, initial encounter for closed fracture: Secondary | ICD-10-CM

## 2016-11-27 DIAGNOSIS — S82221D Displaced transverse fracture of shaft of right tibia, subsequent encounter for closed fracture with routine healing: Secondary | ICD-10-CM | POA: Diagnosis not present

## 2016-11-27 DIAGNOSIS — E118 Type 2 diabetes mellitus with unspecified complications: Secondary | ICD-10-CM | POA: Diagnosis not present

## 2016-11-27 DIAGNOSIS — S82401A Unspecified fracture of shaft of right fibula, initial encounter for closed fracture: Secondary | ICD-10-CM

## 2016-11-27 NOTE — Patient Instructions (Addendum)
Recommendations: 1. At both lunch and dinner, make sure you get at least these three components:  - Protein (meat, fish, poultry, eggs, dairy foods, and beans).   - Starch (bread, rice, potatoes, corn, pasta, any baked good, crackers, cereal),       STARCH NEEDS TO BE LIMITED TO ONE SERVING per meal.     This means only one slice of bread or 1/2 cup of potatoes per meal.     - Vegetables.  No limit to the amount of vegetables you can eat.   2. Beverages: For the next two weeks, limit all drinks to water or non-sweet drinks only.   This means only plain water or diet drinks, NO juices and NO soda or sweet tea.   3. If you want a snack between lunch and dinner, fresh fruit will be the best choice.   Ask your son if he can come to a follow-up nutrition appointment with you on December 10 at 1:30 PM.

## 2016-11-27 NOTE — Progress Notes (Signed)
Medical Nutrition Therapy:  Appt start time: 1430 end time:  [id number]. PCP: [name], MD Therapist: Francee Piccolo, [facility]   Assessment:  Primary concerns today: Weight management and Blood sugar control.  Ms. Bonaventura just got her boot off her right foot today; can walk with a walker, but is still pretty unstable.  She has gained about more than 50 lb in the past year (all of which happened before her broken leg in July).  She will be doing physical therapy, but doesn't yet know how many times per week.    In the household is Violette, her nearly-20-YO son, and her son's friend, who cooks for the household.  He works second shift, however, so dinner is usually 11:30 PM.  Sweet can use the microwave on her own.    Usual eating pattern includes only lunch and dinner and snacks.  Usually gets up around noon, and has lunch such as hamburger w/ white bread or steak w/ broccoli.  Loralye said she does not eat or drink between mid-day and the 11:30 PM dinner meal, as there is currently virtually no food in the house.  It is difficult to assess the accuracy of Damyah's diet history, including usual intake of both foods and beverages.    24-hr recall:  (Up at not sure what time; took med's with water) B ( AM)-  water Snk ( AM)-  --- L (3 PM)-  24 oz soda, 2 chx drummettes, 1/4 chx salad, 1/2 c pasta salad, 2 Ritz crackers, 3 small meatballs, 1/4 c broccoli-pecans-raisin salad Snk ( PM)-  --- D ( PM)-  --- Snk (8 PM)-  1 cupcake (no icing), water Typical day? No. Went to a baby shower in afternoon.      Progress Towards Goal(s):  In progress.   Nutritional Diagnosis:  NB-1.1 Food and nutrition-related knowledge deficit As related to weight management and BG control.  As evidenced by patient acknowledgement.    Intervention:  Nutrition education.  Handouts given during visit include:  AVS  Demonstrated degree of understanding via:  Teach Back  Barriers to learning/adherence to  lifestyle change: Knowledge deficit as well as disinterest in food preparation and food insecurity.   Monitoring/Evaluation:  Dietary intake, exercise, FBG, and body weight in 4 week(s).

## 2016-11-27 NOTE — Progress Notes (Signed)
Office Visit Note   Patient: Tiffany [name]           Date of Birth: Jan 22, 1960           MRN: [id number] Visit Date: 11/27/2016              Requested by: [name], [address] [place], [place] [address] PCP: [name], MD   Assessment & Plan: Visit Diagnoses:  1. Closed displaced transverse fracture of shaft of right tibia with routine healing, subsequent encounter   2. Fracture tibia/fibula, right, closed, initial encounter     Plan: Patient is demonstrated healing of the tibia fracture at this point she can discontinue the cam walker and referral to outpatient physical therapy was made.  Questions encouraged and answered.  Follow-up as needed.  Follow-Up Instructions: Return if symptoms worsen or fail to improve.   Orders:  Orders Placed This Encounter  Procedures  . XR Tibia/Fibula Right   No orders of the defined types were placed in this encounter.     Procedures: No procedures performed   Clinical Data: No additional findings.   Subjective: Chief Complaint  Patient presents with  . Right Leg - Follow-up    Patient is a 56 year old female who is 5 months status post IM nailing of a tibia fracture.  [name] doing much better.  She denies any real complaints.    Review of Systems   Objective: Vital Signs: There were no vitals taken for this visit.  Physical Exam  Ortho Exam Right leg exam is benign.  Fully healed surgical scars.  Specialty Comments:  No specialty comments available.  Imaging: Xr Tibia/fibula Right  Result Date: 11/27/2016 Abundant callus formation demonstrating healing of the tibia fracture    PMFS History: Patient Active Problem List   Diagnosis Date Noted  . Carotid artery stenosis   . Right foot pain 11/15/2016  . Right carotid bruit 11/09/2016  . Hypokalemia   . Osteoarthritis 10/26/2016  . Dyspnea 10/07/2016  . Acute left ankle pain   . Cardiac arrest ([place]) 09/08/2016  . History of  open reduction and internal fixation (ORIF) procedure   . Closed displaced transverse fracture of shaft of right tibia   . Fall   . Overactive bladder 06/07/2016  . QT prolongation   . OSA and COPD overlap syndrome ([place])   . Generalized weakness   . Arthritis   . Bipolar I disorder ([place])   . Essential hypertension 03/22/2016  . Bipolar affective disorder, mixed, severe, with psychotic behavior ([place]) 11/28/2015  . Cocaine use disorder, severe, dependence ([place]) 11/28/2015  . Chronic diastolic congestive heart failure ([place])   . Chronic respiratory failure with hypoxia and hypercapnia ([place]) 06/22/2015  . Onychomycosis 01/21/2015  . Tobacco use disorder 07/22/2014  . COPD (chronic obstructive pulmonary disease) ([place]) 07/08/2014  . Knee pain, bilateral 01/22/2013  . Seizure ([place]) 01/04/2013  . Health care maintenance 11/25/2012  . History of kidney stones 06/18/2012  . Chronic pain syndrome 06/18/2012  . Allergic reaction 04/07/2012  . HSV infection 08/30/2011  . Dyslipidemia 04/24/2011  . Anemia 04/24/2011  . Diabetic neuropathy ([place]) 04/24/2011  . Obstructive sleep apnea 10/18/2010  . Asthma 10/18/2010  . Morbid obesity ([place]) 10/18/2010  . Type 2 diabetes mellitus ([place]) 10/18/2010   Past Medical History:  Diagnosis Date  . Anxiety   . Arthritis    "all over" (04/10/2016)  . Asthma   . Cardiac arrest ([place]) 09/08/2016  PEA  . Carotid artery stenosis    1-39% bilateral by dopplers 11/2016  . Chronic bronchitis ([place])   . Chronic diastolic (congestive) heart failure ([place])   . Chronic kidney disease    "I see a kidney dr." (04/10/2016)  . Cocaine abuse ([place])   . Complication of anesthesia    decreased bp, decreased heart rate  . Depression   . Disorder of nervous system   . Emphysema   . GERD (gastroesophageal reflux disease)   . Heart attack ([place]) 1980s  . History of blood transfusion 1994   "couldn't stop bleeding from my period"  . Hyperlipidemia LDL goal <70   .  Hypertension   . Incontinence   . Manic depression ([place])   . On home oxygen therapy    "6L; 24/7" (04/10/2016)  . OSA on CPAP    "wear mask sometimes" (04/10/2016)  . Paranoid ([place])    "sometimes; I'm on RX for it" (04/10/2016)  . Pneumonia    "I've had it several times; haven't had it since 06/2015" (04/10/2016)  . Schizophrenia ([place])   . Seasonal allergies   . Seizures ([place])    "don't know what kind; last one was ~ 1 yr ago" (04/10/2016)  . Sinus trouble   . Stroke [facility]) 1980s   denies residual on 04/10/2016  . Type II diabetes mellitus (HCC)     Family History  Problem Relation Age of Onset  . Cancer Father        prostate  . Cancer Mother        lung  . Depression Mother   . Depression Sister   . Anxiety disorder Sister   . Schizophrenia Sister   . Bipolar disorder Sister   . Depression Sister   . Depression Brother   . Heart failure Unknown        cousin    Past Surgical History:  Procedure Laterality Date  . CESAREAN SECTION  1997  . HERNIA REPAIR    . IR CHOLANGIOGRAM EXISTING TUBE  07/20/2016  . IR PERC CHOLECYSTOSTOMY  05/10/2016  . IR RADIOLOGIST EVAL & MGMT  06/08/2016  . IR RADIOLOGIST EVAL & MGMT  06/29/2016  . IR SINUS/FIST TUBE CHK-NON GI  07/12/2016  . UMBILICAL HERNIA REPAIR  ~ 1963   "that's why I don't have a belly button"  . VAGINAL HYSTERECTOMY     Social History   Occupational History  . Occupation: disabled    Comment: factory production  Tobacco Use  . Smoking status: Former Smoker    Packs/day: 1.50    Years: 38.00    Pack years: 57.00    Types: Cigarettes    Start date: 03/13/1977    Last attempt to quit: 04/10/2016    Years since quitting: 0.6  . Smokeless tobacco: Never Used  Substance and Sexual Activity  . Alcohol use: No    Alcohol/week: 0.0 oz  . Drug use: No    Comment: 04/10/2016 "last used cocaine back in November 2017"  . Sexual activity: No    Birth control/protection: Surgical

## 2016-11-28 ENCOUNTER — Telehealth (INDEPENDENT_AMBULATORY_CARE_PROVIDER_SITE_OTHER): Payer: Self-pay | Admitting: Orthopaedic Surgery

## 2016-11-28 ENCOUNTER — Ambulatory Visit (INDEPENDENT_AMBULATORY_CARE_PROVIDER_SITE_OTHER): Payer: Medicare HMO | Admitting: Emergency Medicine

## 2016-11-28 ENCOUNTER — Encounter: Payer: Self-pay | Admitting: Emergency Medicine

## 2016-11-28 ENCOUNTER — Other Ambulatory Visit: Payer: Self-pay | Admitting: Internal Medicine

## 2016-11-28 ENCOUNTER — Encounter (INDEPENDENT_AMBULATORY_CARE_PROVIDER_SITE_OTHER): Payer: Self-pay

## 2016-11-28 DIAGNOSIS — J42 Unspecified chronic bronchitis: Secondary | ICD-10-CM

## 2016-11-28 DIAGNOSIS — G4733 Obstructive sleep apnea (adult) (pediatric): Secondary | ICD-10-CM | POA: Diagnosis not present

## 2016-11-28 DIAGNOSIS — E118 Type 2 diabetes mellitus with unspecified complications: Secondary | ICD-10-CM

## 2016-11-28 DIAGNOSIS — J9611 Chronic respiratory failure with hypoxia: Secondary | ICD-10-CM | POA: Diagnosis not present

## 2016-11-28 DIAGNOSIS — J9612 Chronic respiratory failure with hypercapnia: Secondary | ICD-10-CM

## 2016-11-28 NOTE — Assessment & Plan Note (Signed)
With associated obesity hypoventilation syndrome.  [name] tolerating the trilogy ventilator and benefiting from it.  We will get her current settings from [facility] and keep track of these.

## 2016-11-28 NOTE — Patient Instructions (Signed)
Please continue to use your trilogy ventilator every night as you have been doing.  We will get the current settings from [facility]. Use your oxygen at 6 L/min at all times as you have been doing Continue Breo and Spiriva as you have been taking them Keep albuterol available to use 2 puffs if needed for shortness of breath Congratulations with your weight loss and decreased salt intake.  Please continue to try working on slow steady weight loss as this will help your heart and lung function. Follow with Dr Lamonte Sakai in 3 months or sooner if you have any problems.

## 2016-11-28 NOTE — Telephone Encounter (Signed)
She has been under my care since her injury.  I just released her the other day when I saw her

## 2016-11-28 NOTE — Progress Notes (Signed)
Subjective:    Patient ID: Tiffany [name], female    DOB: 04/02/1960, 56 y.o.   MRN: [id number]  HPI 56 year old active smoker (18 pack years), with a history of schizophrenia, childhood asthma, substance abuse including cocaine, tobacco use and suspected COPD apparently with asthmatic features based on past clinical history. She has been able to stay off cocaine since her last hospitalization.  She was dx w COPD in her 47's. Currently managed on Spiriva and Breo. She uses albuterol about 3-4x a day. She believes that it helps w dyspnea and wheeze. She has cough, much less than in recent months. She has an acute flare about once a year, has been rx with abx and pred before. She has OSA but does not use CPAP, trouble tolerating the mask.   ROV 05/08/16 -- follow up visit for hx tobacco and asthma, OSA. Chronic hypoxemic resp failure from this + OHS. She stopped smoking 04/10/16!! She believes that her dyspnea is about the same, cough and mucous are better off the cigarettes.  [name] using 21mg  nicotine patches. She remains on breo and Spiriva. No exacerbations since last time. Uses albuterol much less, not every week. She underwent spirometry today that I have personally reviewed. This shows severe obstruction, FEV1 1.13 L (53% predicted).   ROV 11/28/16 --56 year old former smoker (20 pk-yrs) with history of schizophrenia, history of substance abuse, hypertension with diastolic CHF, COPD with asthmatic features based on spirometry performed 05/08/16 that showed severe obstruction.  She has obstructive sleep apnea and obesity hypoventilation syndrome.  She was admitted to the hospital 09/2016 and then again 10/2016.  In September she had acute on chronic respiratory failure with decompensated diastolic heart failure, suffered a PEA arrest and required mechanical ventilation.  She was weaned to BiPAP and then transitioned to a Trilogy ventilator. [name] wearing o2 at all times, 6L/min. [name] currently on Breo  and Spiriva . She uses albuterol rarely, a few times a month. [name] now wheezing or coughing. She tells me Tiffany [name] has benefited a lot from diet modification, less salt, etc. Eating smaller meals, trying to exercise a little bit. Offered her the flu shot - she declined.    Review of Systems  Constitutional: Positive for unexpected weight change. Negative for fever.  HENT: Positive for dental problem and sinus pressure. Negative for congestion, ear pain, nosebleeds, postnasal drip, rhinorrhea, sneezing, sore throat and trouble swallowing.   Eyes: Negative.  Negative for redness and itching.  Respiratory: Positive for cough, shortness of breath and wheezing. Negative for chest tightness.   Cardiovascular: Positive for leg swelling. Negative for palpitations.  Gastrointestinal: Negative.  Negative for nausea and vomiting.  Endocrine: Negative.   Genitourinary: Negative.  Negative for dysuria.  Musculoskeletal: Positive for joint swelling.  Skin: Negative.  Negative for rash.  Allergic/Immunologic: Negative.   Neurological: Negative.  Negative for headaches.  Hematological: Bruises/bleeds easily.  Psychiatric/Behavioral: Negative.  Negative for dysphoric mood. The patient is not nervous/anxious.    Past Medical History:  Diagnosis Date  . Anxiety   . Arthritis    "all over" (04/10/2016)  . Asthma   . Cardiac arrest ([place]) 09/08/2016   PEA  . Carotid artery stenosis    1-39% bilateral by dopplers 11/2016  . Chronic bronchitis ([place])   . Chronic diastolic (congestive) heart failure ([place])   . Chronic kidney disease    "I see a kidney dr." (04/10/2016)  . Cocaine abuse ([place])   . Complication of anesthesia  decreased bp, decreased heart rate  . Depression   . Disorder of nervous system   . Emphysema   . GERD (gastroesophageal reflux disease)   . Heart attack ([place]) 1980s  . History of blood transfusion 1994   "couldn't stop bleeding from my period"  . Hyperlipidemia LDL goal <70   .  Hypertension   . Incontinence   . Manic depression ([place])   . On home oxygen therapy    "6L; 24/7" (04/10/2016)  . OSA on CPAP    "wear mask sometimes" (04/10/2016)  . Paranoid ([place])    "sometimes; I'm on RX for it" (04/10/2016)  . Pneumonia    "I've had it several times; haven't had it since 06/2015" (04/10/2016)  . Schizophrenia ([place])   . Seasonal allergies   . Seizures ([place])    "don't know what kind; last one was ~ 1 yr ago" (04/10/2016)  . Sinus trouble   . Stroke [facility]) 1980s   denies residual on 04/10/2016  . Type II diabetes mellitus (HCC)      Family History  Problem Relation Age of Onset  . Cancer Father        prostate  . Cancer Mother        lung  . Depression Mother   . Depression Sister   . Anxiety disorder Sister   . Schizophrenia Sister   . Bipolar disorder Sister   . Depression Sister   . Depression Brother   . Heart failure Unknown        cousin     Social History   Socioeconomic History  . Marital status: Widowed    Spouse name: Not on file  . Number of children: 3  . Years of education: Not on file  . Highest education level: Not on file  Social Needs  . Financial resource strain: Not on file  . Food insecurity - worry: Not on file  . Food insecurity - inability: Not on file  . Transportation needs - medical: Not on file  . Transportation needs - non-medical: Not on file  Occupational History  . Occupation: disabled    Comment: factory production  Tobacco Use  . Smoking status: Former Smoker    Packs/day: 1.50    Years: 38.00    Pack years: 57.00    Types: Cigarettes    Start date: 03/13/1977    Last attempt to quit: 04/10/2016    Years since quitting: 0.6  . Smokeless tobacco: Never Used  Substance and Sexual Activity  . Alcohol use: No    Alcohol/week: 0.0 oz  . Drug use: No    Comment: 04/10/2016 "last used cocaine back in November 2017"  . Sexual activity: No    Birth control/protection: Surgical  Other Topics Concern  . Not on file    Social History Narrative   Has 1 son, Tiffany [name]   Lives with son and his boyfriend   Her house has ramps and handrails should she ever needs them.    Her mother lives down the street from her and is a good support person in addition to her son.   She drives herself, has private transportation.    Cocaine free since 02/24/16, smoke free since 04/10/16     Allergies  Allergen Reactions  . Hydrocodone-Acetaminophen Shortness Of Breath  . Hydroxyzine Anaphylaxis and Shortness Of Breath  . Latuda [Lurasidone Hcl] Anaphylaxis  . Codeine Nausea And Vomiting  . Sulfa Antibiotics Itching     Outpatient Medications Prior to  Visit  Medication Sig Dispense Refill  . albuterol (PROVENTIL HFA;VENTOLIN HFA) 108 (90 Base) MCG/ACT inhaler Inhale 2 puffs every 6 (six) hours as needed into the lungs for wheezing or shortness of breath. 1 Inhaler 0  . albuterol (PROVENTIL) (2.5 MG/3ML) 0.083% nebulizer solution Take 3 mLs (2.5 mg total) every 6 (six) hours as needed by nebulization for wheezing or shortness of breath. 150 mL 1  . amLODipine (NORVASC) 5 MG tablet Take 1 tablet (5 mg total) daily by mouth. 90 tablet 0  . ARIPiprazole (ABILIFY) 5 MG tablet Take 1/2 tab daily for 1 week and than 1 tab adily 30 tablet 1  . aspirin 81 MG EC tablet Take 1 tablet (81 mg total) by mouth daily. Swallow whole. 90 tablet 0  . atorvastatin (LIPITOR) 80 MG tablet Take 1 tablet (80 mg total) daily by mouth. 60 tablet 1  . escitalopram (LEXAPRO) 20 MG tablet Take 1 tablet (20 mg total) by mouth daily. 30 tablet 1  . fluticasone furoate-vilanterol (BREO ELLIPTA) 100-25 MCG/INH AEPB INHALE 1 PUFF EVERY DAY 180 each 0  . gabapentin (NEURONTIN) 600 MG tablet Take 1 tablet (600 mg total) by mouth 3 (three) times daily. 270 tablet 0  . guaiFENesin (MUCINEX) 600 MG 12 hr tablet Take 1 tablet (600 mg total) by mouth 2 (two) times daily. 30 tablet 0  . metFORMIN (GLUCOPHAGE-XR) 500 MG 24 hr tablet Take 1 tablet (500 mg total) by mouth  2 (two) times daily. 180 tablet 0  . metolazone (ZAROXOLYN) 2.5 MG tablet Take 1 tablet 2 days per week (Tuesdays and Fridays). Take 30 minutes before taking Torsemide 24 tablet 3  . mirtazapine (REMERON SOL-TAB) 30 MG disintegrating tablet Take 1 tablet (30 mg total) by mouth at bedtime. 30 tablet 1  . montelukast (SINGULAIR) 10 MG tablet Take 1 tablet (10 mg total) by mouth at bedtime. 90 tablet 1  . Multiple Vitamin (MULTIVITAMIN WITH MINERALS) TABS tablet Take 1 tablet by mouth daily.    [name] omeprazole (PRILOSEC) 40 MG capsule Take 1 capsule (40 mg total) by mouth 2 (two) times daily. 180 capsule 0  . OXYGEN Inhale 6 L into the lungs daily.    . potassium chloride (K-DUR) 10 MEQ tablet Take 5 tablets (50 mEq total) daily by mouth. 60 tablet 1  . tiotropium (SPIRIVA HANDIHALER) 18 MCG inhalation capsule Place 1 capsule (18 mcg total) into inhaler and inhale daily. 90 capsule 1  . torsemide (DEMADEX) 20 MG tablet Take 4 tablets (80 mg total) by mouth 2 (two) times daily. 720 tablet 3  . Trolamine Salicylate 10 % LOTN Apply 1 patch topically 3 (three) times daily as needed. 141 g 2   No facility-administered medications prior to visit.         Objective:   Physical Exam Vitals:   11/28/16 1542  BP: 120/78  Pulse: 84  SpO2: 98%  Weight: 295 lb (133.8 kg)  Height: 5\' 4"  (1.626 m)   Gen: Pleasant, overwt, in no distress,  normal affect  ENT: No lesions,  mouth clear,  oropharynx clear, no postnasal drip  Neck: No JVD, no TMG, no carotid bruits  Lungs: No use of accessory muscles, clear without rales or rhonchi  Cardiovascular: RRR, heart sounds normal, no murmur or gallops, no peripheral edema  Musculoskeletal: No deformities, no cyanosis or clubbing  Neuro: alert, non focal  Skin: Warm, no lesions or rashes       Assessment & Plan:  COPD (chronic obstructive  pulmonary disease) ([place]) Tolerating and benefiting from her bronchodilators.  Continue Breo and Spiriva as ordered.   She rarely uses albuterol.  Offered her the flu shot, she declined.  Obstructive sleep apnea With associated obesity hypoventilation syndrome.  [name] tolerating the trilogy ventilator and benefiting from it.  We will get her current settings from [facility] and keep track of these.  Chronic respiratory failure with hypoxia and hypercapnia (HCC) Currently tolerating Trilogy ventilator qhs.  Continue same settings.  Baltazar Apo, MD, PhD 11/28/2016, 4:19 PM [facility] [phone] or if no answer [phone]

## 2016-11-28 NOTE — Telephone Encounter (Signed)
Patient called asked if Dr Erlinda Hong can write a note for her stating she is under Dr Phoebe Sharps care and how long Dr Erlinda Hong has been seeing her.  Patient said she have to be in court in [place] tomorrow at 8:30am. Patient said she would like to pick up the note today. The number to contact patient is [phone]

## 2016-11-28 NOTE — Telephone Encounter (Signed)
See message below °

## 2016-11-28 NOTE — Assessment & Plan Note (Signed)
Tolerating and benefiting from her bronchodilators.  Continue Breo and Spiriva as ordered.  She rarely uses albuterol.  Offered her the flu shot, she declined.

## 2016-11-28 NOTE — Assessment & Plan Note (Signed)
Currently tolerating Trilogy ventilator qhs.  Continue same settings.

## 2016-11-29 ENCOUNTER — Other Ambulatory Visit (HOSPITAL_COMMUNITY): Payer: Self-pay | Admitting: Psychiatry

## 2016-11-29 ENCOUNTER — Other Ambulatory Visit: Payer: Self-pay | Admitting: Internal Medicine

## 2016-11-29 DIAGNOSIS — F200 Paranoid schizophrenia: Secondary | ICD-10-CM

## 2016-11-29 IMAGING — CR DG CERVICAL SPINE COMPLETE 4+V
9 series · 9 of 9 positions shown · non-contrast
Comparison: None.

CLINICAL DATA: Syncope.  Fall.

EXAM:
CERVICAL SPINE  4+ VIEWS

[c-spine lat]
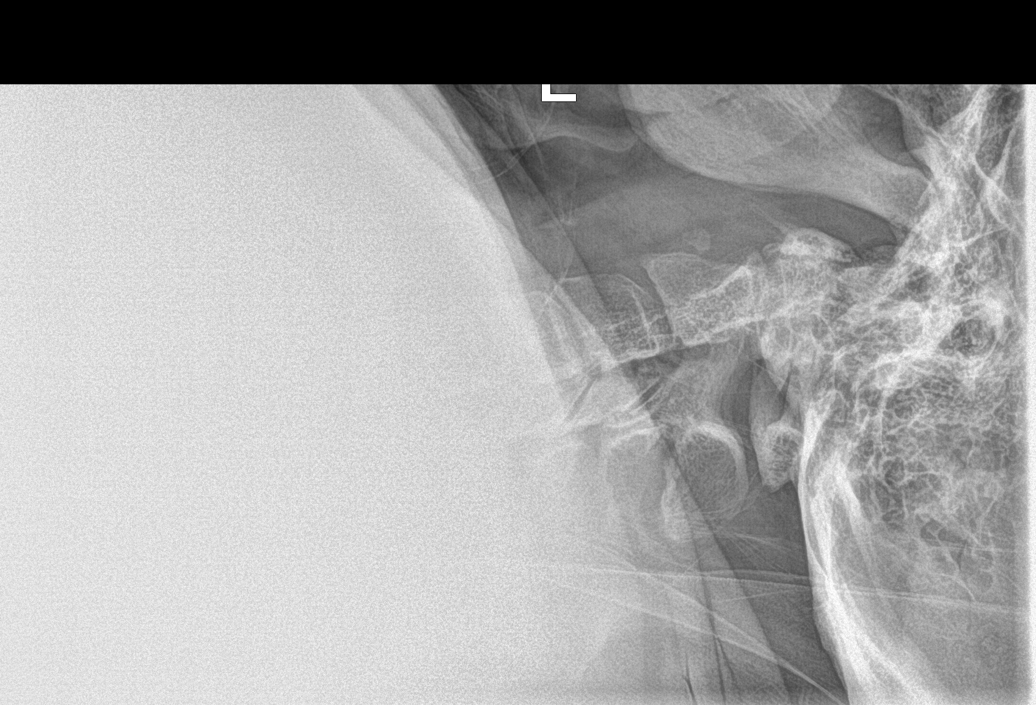

[c-spine obl (1 of 2)]
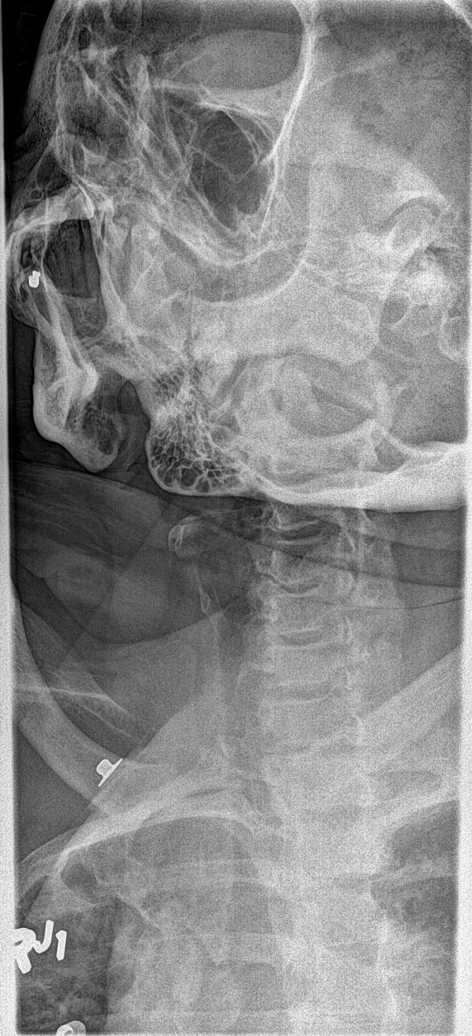

[c-spine obl (2 of 2)]
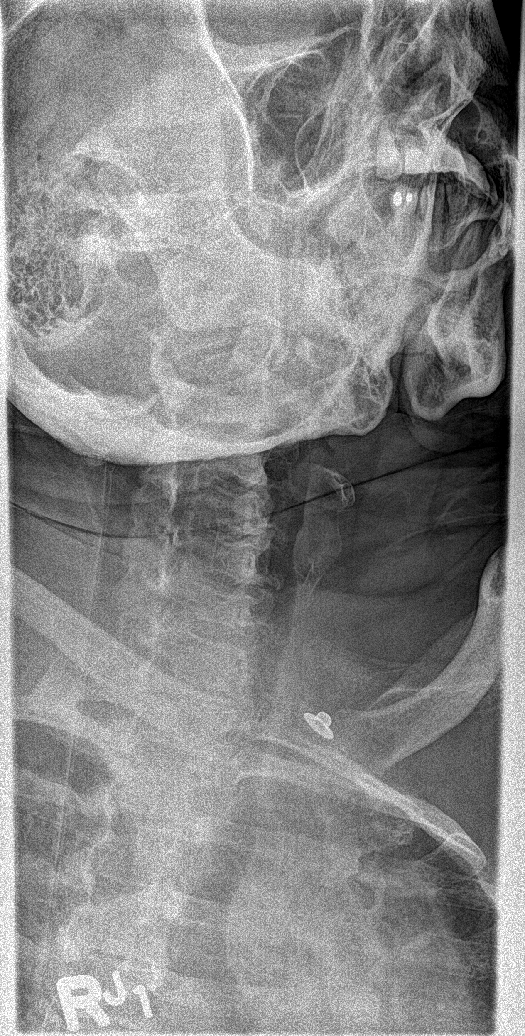

[c-spine ap]
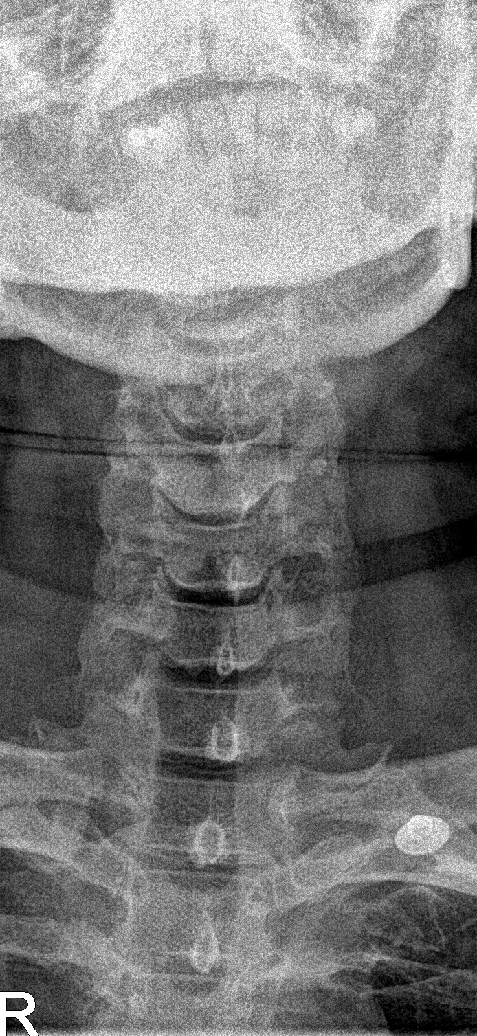

[c-spine open mouth (1 of 2)]
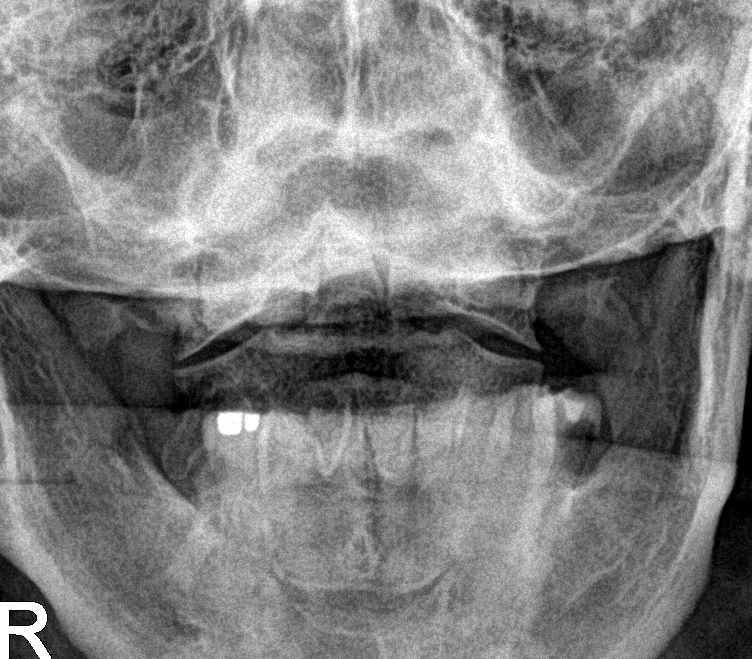

[c-spine swimmers (1 of 2)]
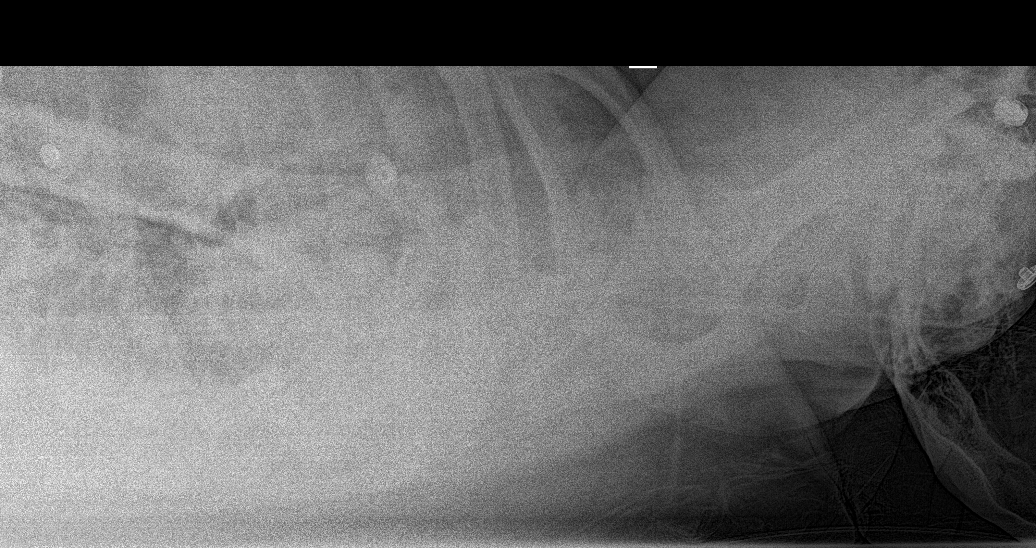

[[person_name]]
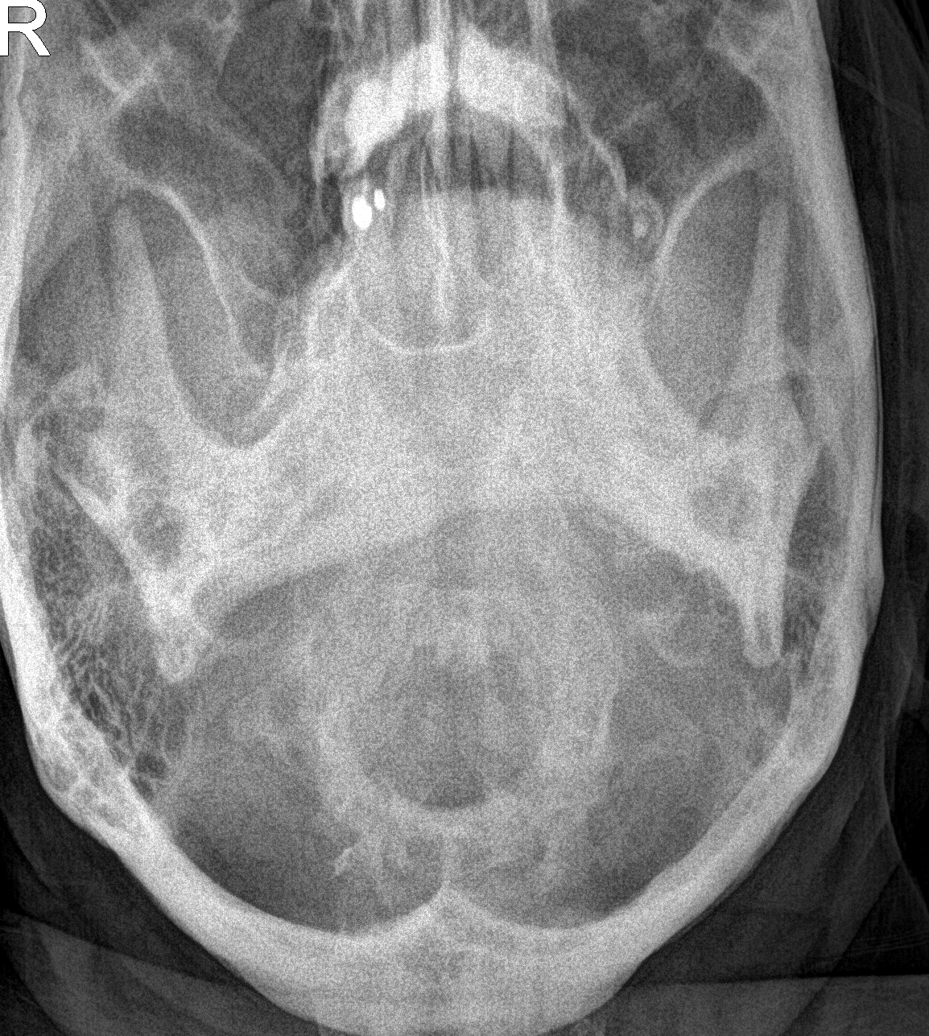

[c-spine open mouth (2 of 2)]
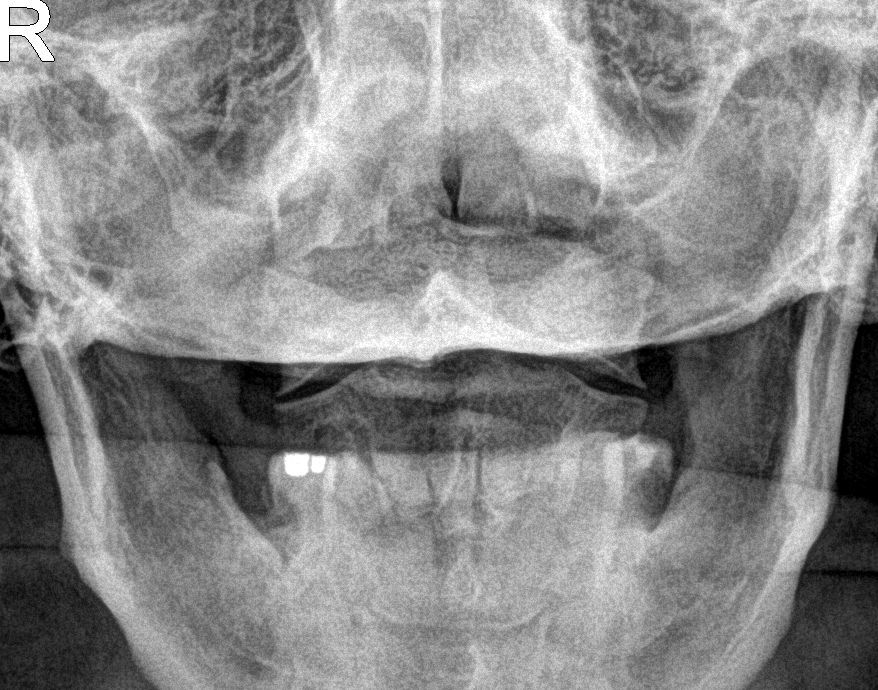

[c-spine swimmers (2 of 2)]
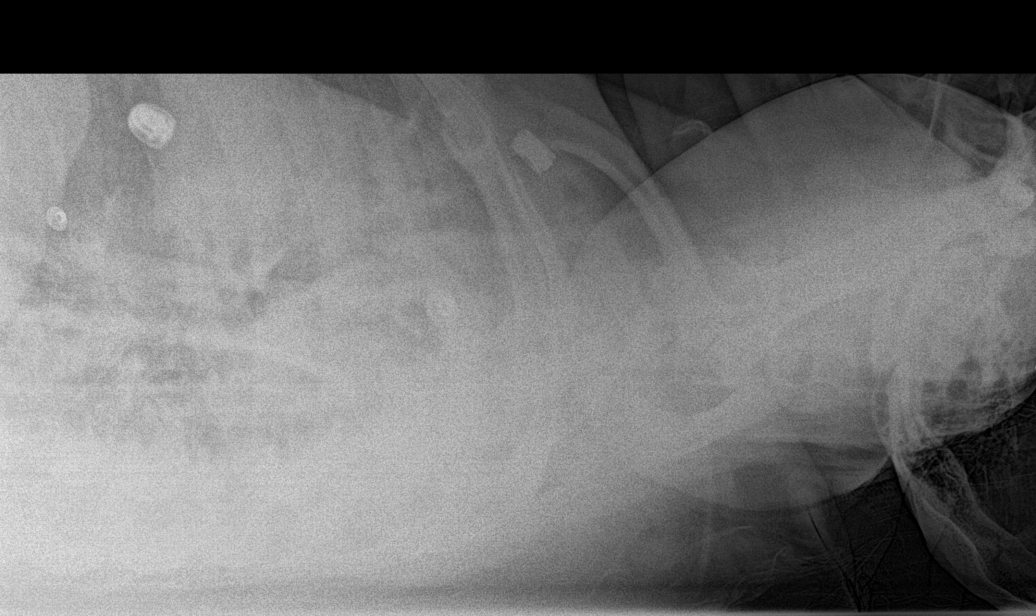

[9 of 9 positions shown; findings below may reference images not displayed]

FINDINGS: The cervical spine is visualized through the top of C4 on the
lateral view. The swimmer's view is suboptimal. There is suspected
prevertebral soft tissue thickening degenerative changes throughout
the cervical spine facets is noted. There is lucency through the
odontoid neck. Fracture is not excluded. This may represent
artifact.
IMPRESSION: There is lucency through the odontoid neck as well as prevertebral
soft tissue swelling. The lateral cervical spine view and swimmer's
view are some optimal with poor visualization. Cervical spine injury
cannot be excluded by this study. CT scan is recommended to further
characterize.

## 2016-11-29 IMAGING — CR DG LUMBAR SPINE COMPLETE 4+V
6 series · 6 of 6 positions shown · non-contrast
Comparison: None.

CLINICAL DATA: Fall with back injury today

EXAM:
LUMBAR SPINE - COMPLETE 4+ VIEW

[l-spine ap (1 of 2)]
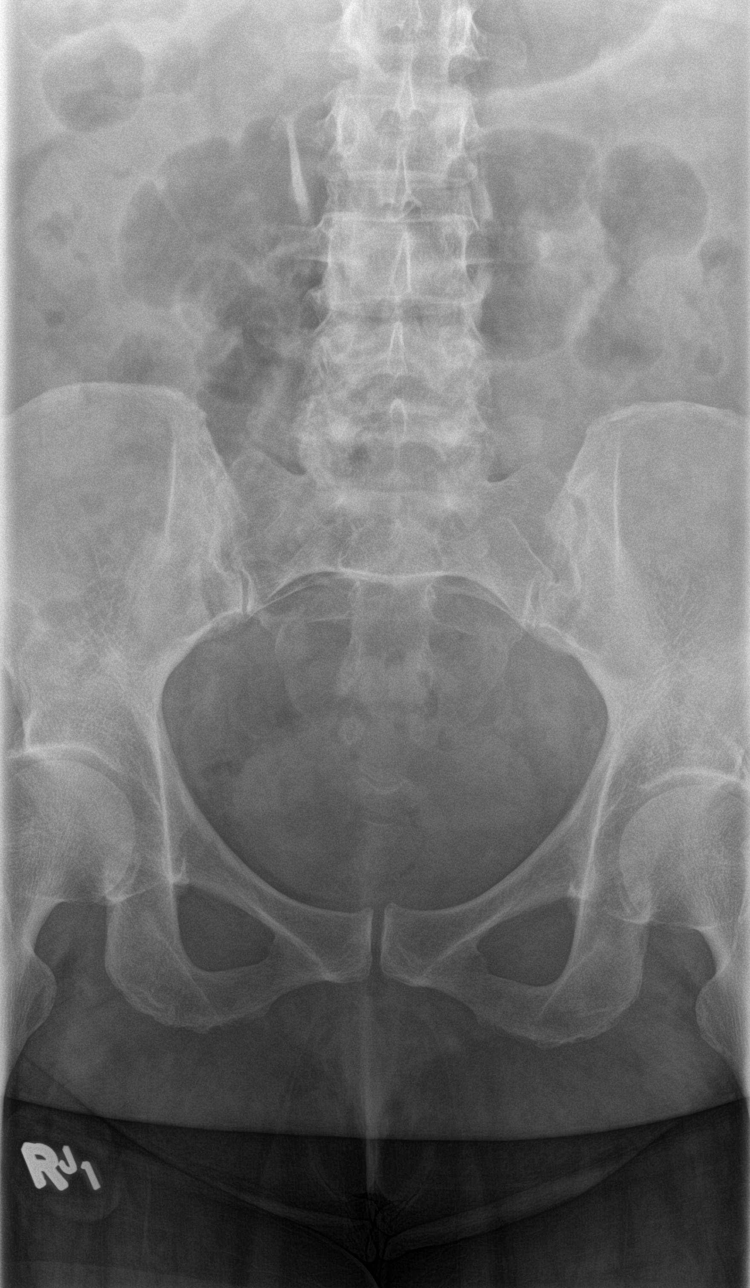

[l-spine obl (1 of 2)]
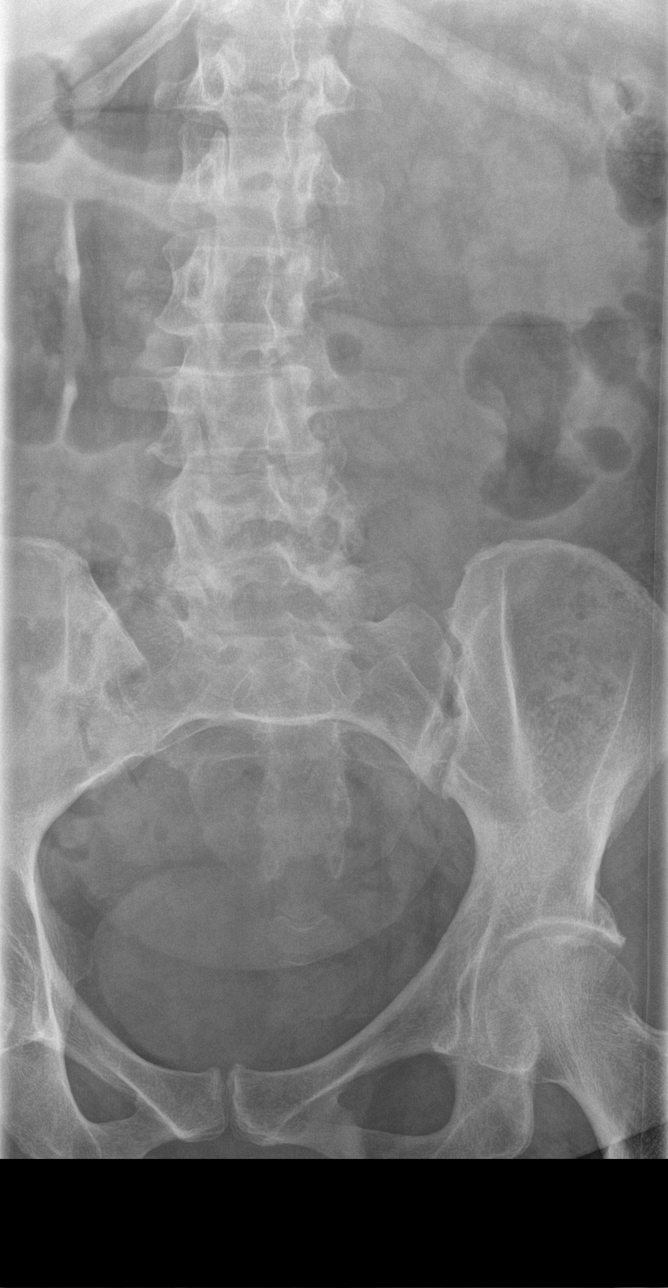

[l-spine obl (2 of 2)]
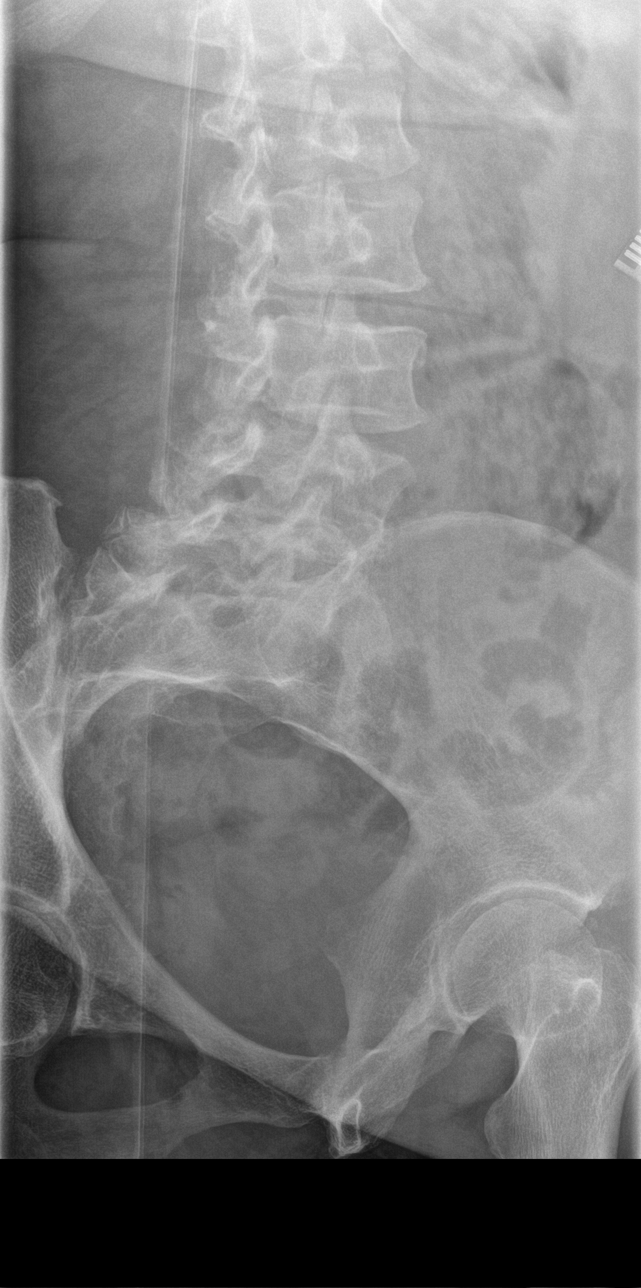

[l-spine lat]
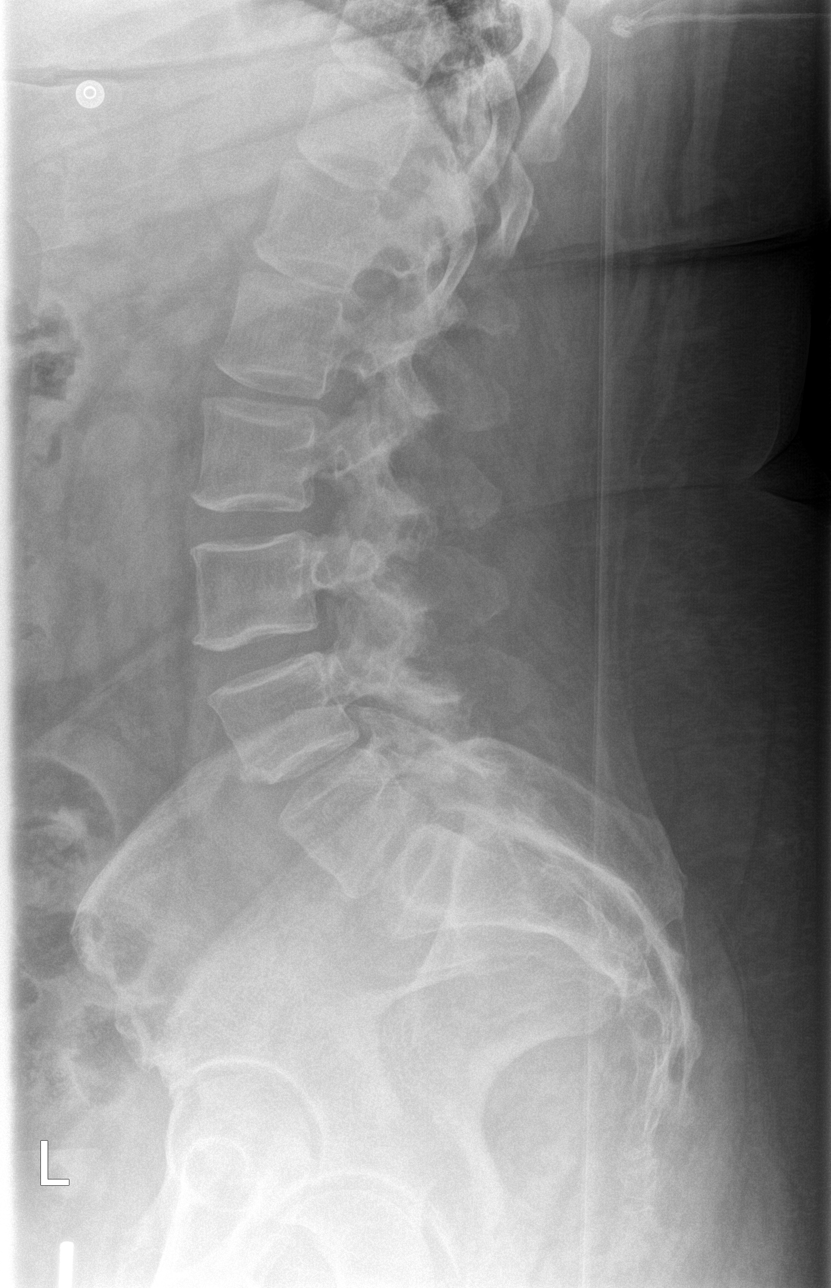

[l-spine spot]
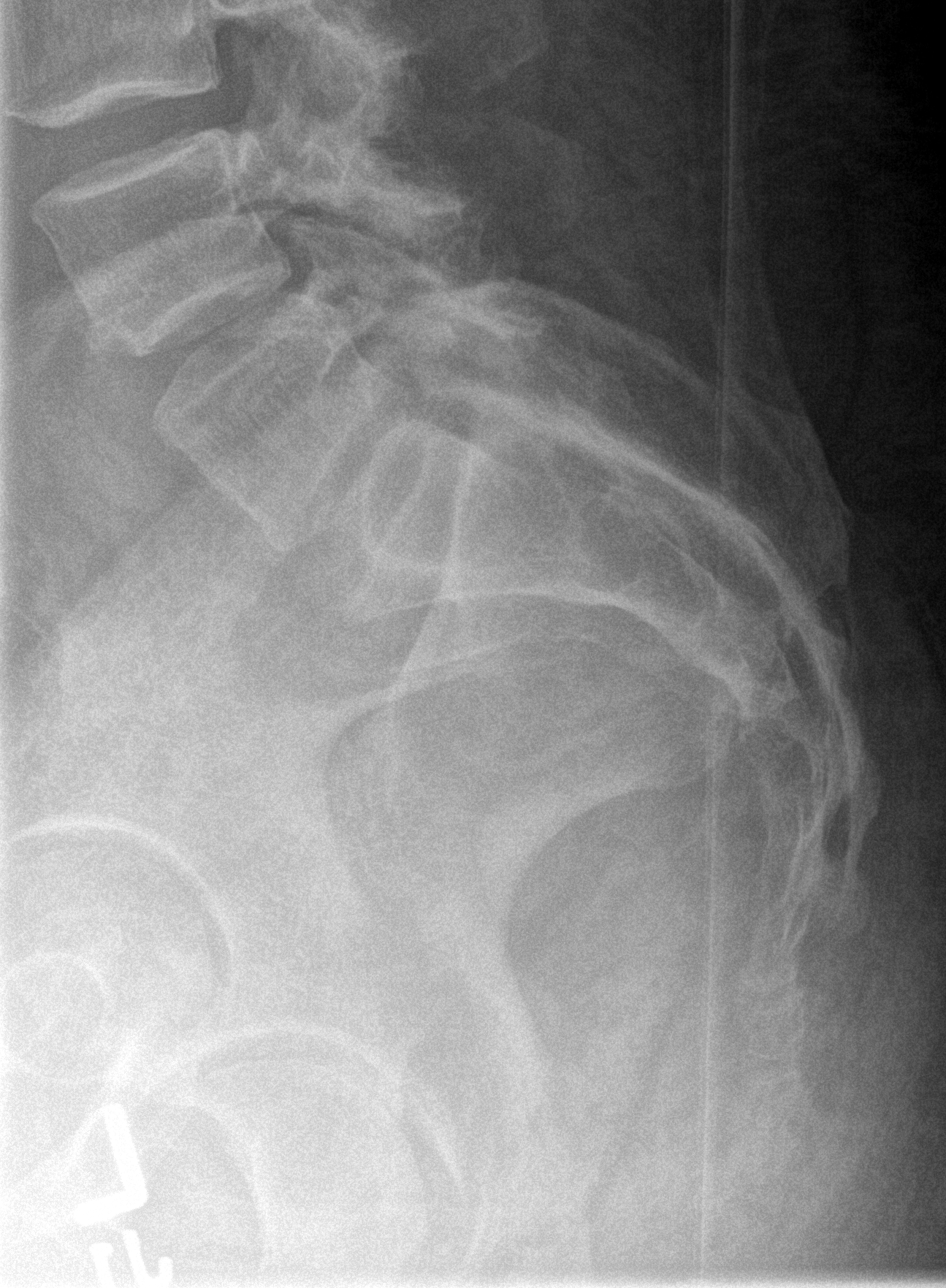

[l-spine ap (2 of 2)]
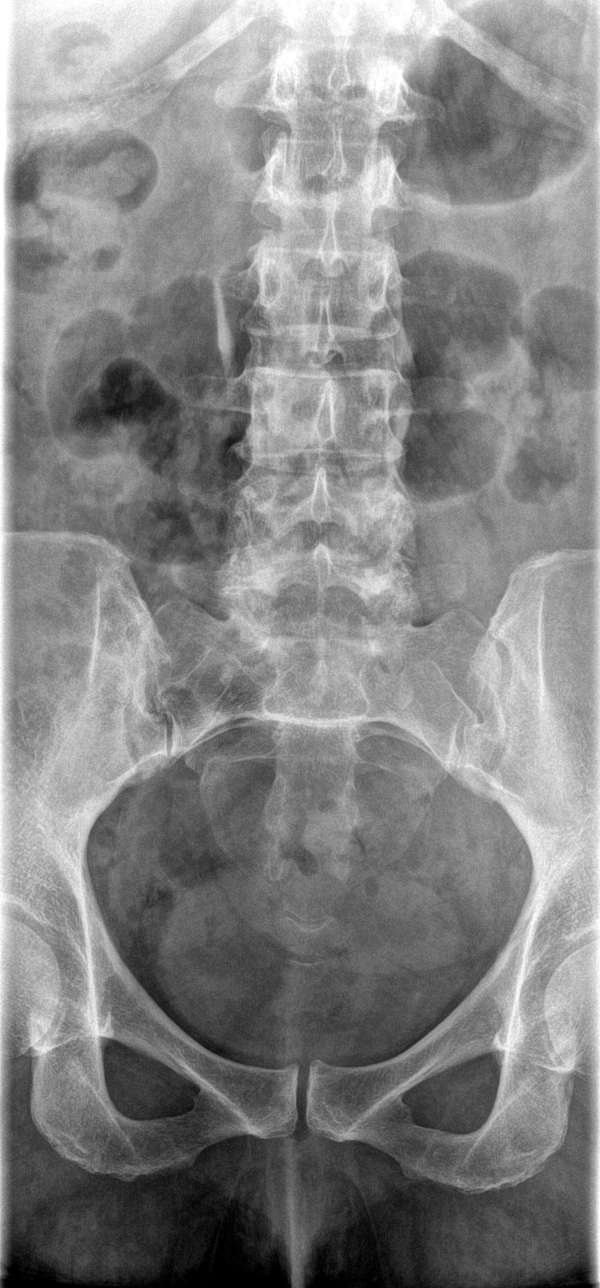

[6 of 6 positions shown; findings below may reference images not displayed]

FINDINGS: Lower lumbar facet arthropathy without significant subluxation. No
lumbar spine fracture observed. Mildly exaggerated lumbar lordosis.
IMPRESSION: 1. Lower lumbar spondylosis. No acute lumbar spine findings are
radiographically apparent.

## 2016-11-29 IMAGING — CT CT CERVICAL SPINE W/O CM
4 series · 15 of 33 positions shown, 18 images · non-contrast
Comparison: Radiographs 12/17/2013.

CLINICAL DATA: Fell.  Neck pain.  Abnormal radiographs.

EXAM:
CT CERVICAL SPINE WITHOUT CONTRAST
TECHNIQUE: Multidetector CT imaging of the cervical spine was performed without
intravenous contrast. Multiplanar CT image reconstructions were also
generated.

[Series 4: 2.0 mm soft tissue · axial · 0.34mm/px · z∈[+1062,+1090]mm · 2 of 82 slices shown]
[im 14/82  soft-tissue]
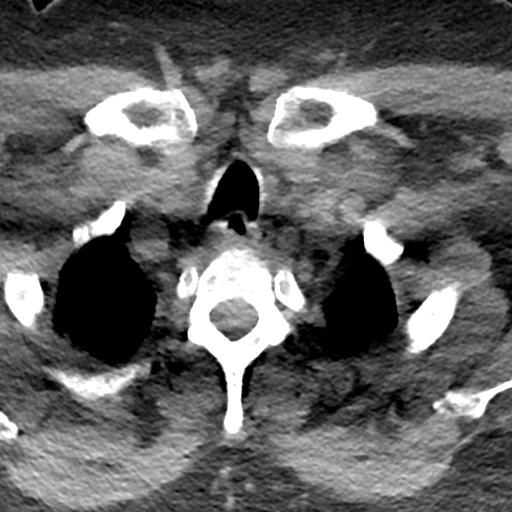
[im 28/82  soft-tissue]
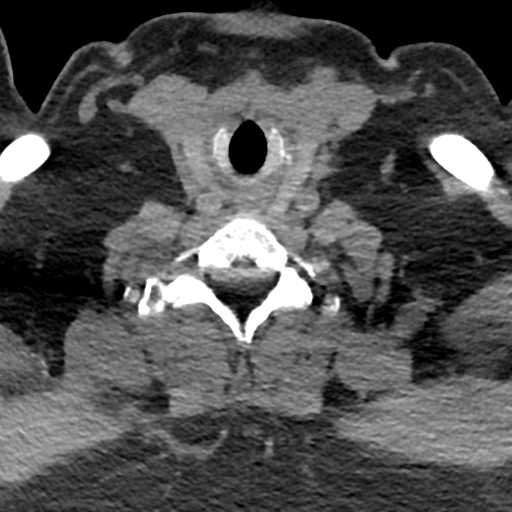

[Series 7: coronals · coronal · 0.28mm/px · 3 of 84 slices shown]
[im 17/84  bone]
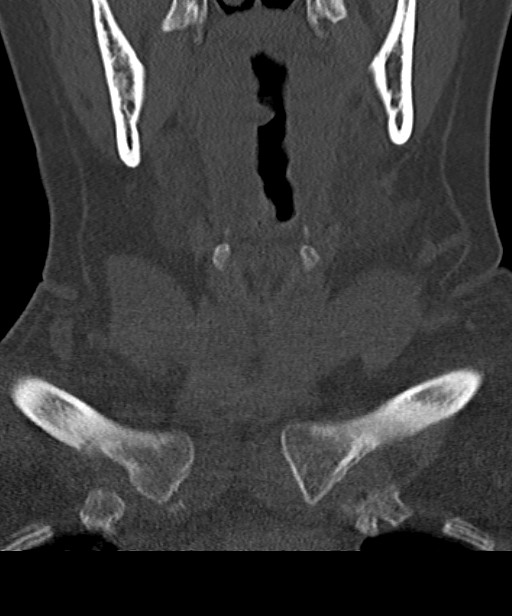
[im 34/84  bone]
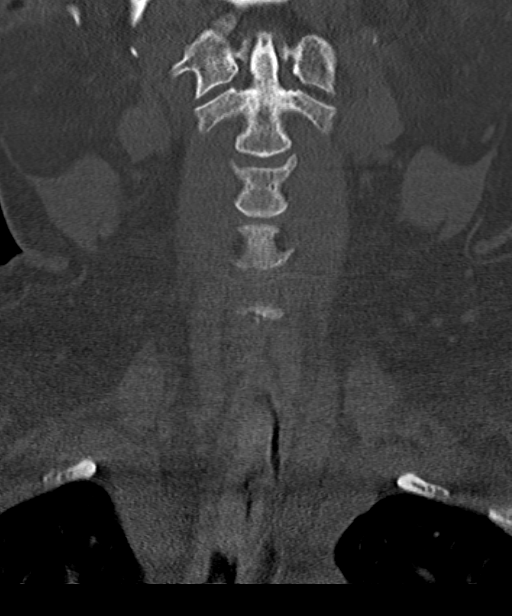
[im 50/84  bone]
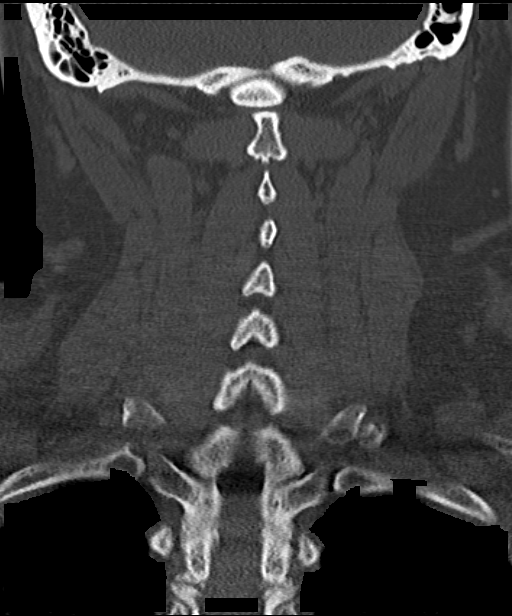

[Series 8: sagittals · sagittal · 0.31mm/px · 5 of 61 slices shown, 6 images]
[im 21/61  bone]
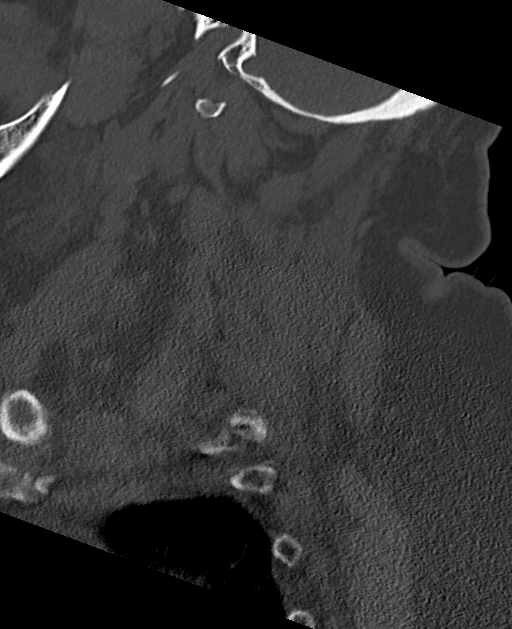
[im 26/61  bone]
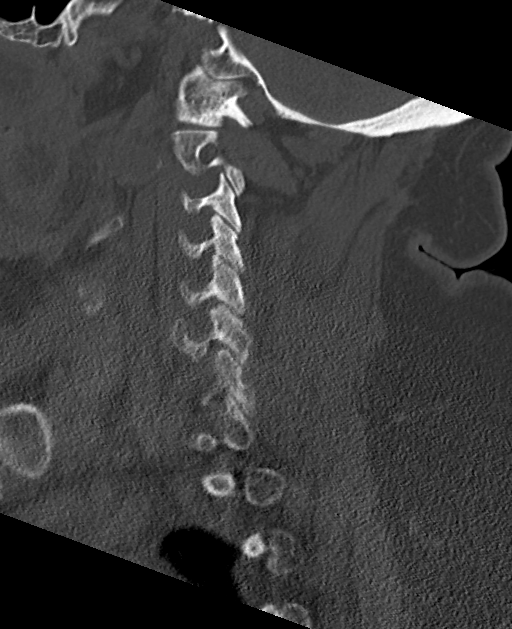
[im 31/61  soft-tissue]
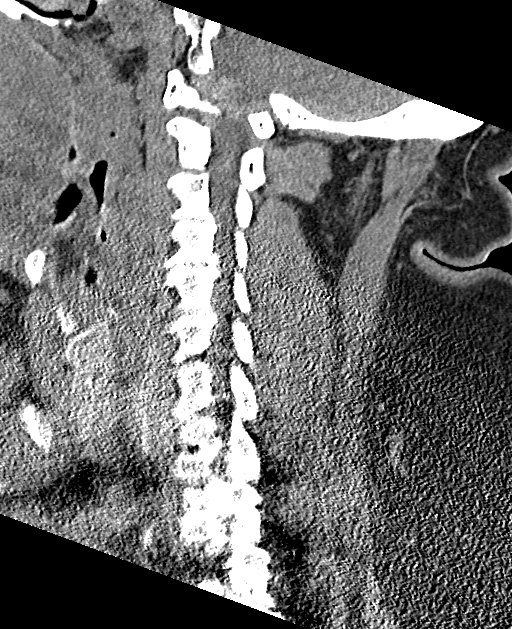
[im 31/61  bone]
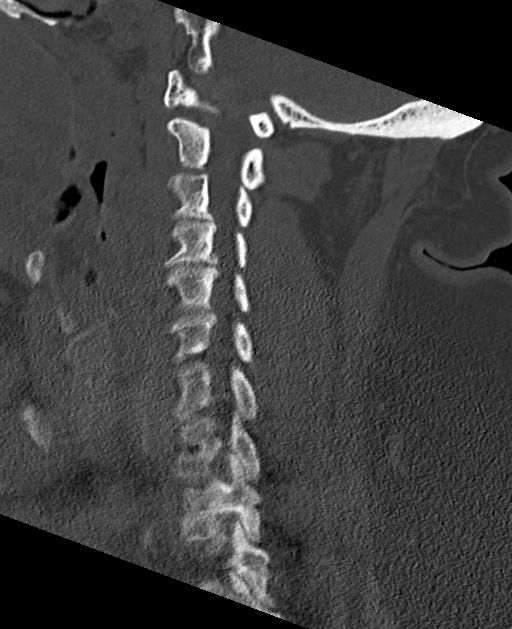
[im 36/61  bone]
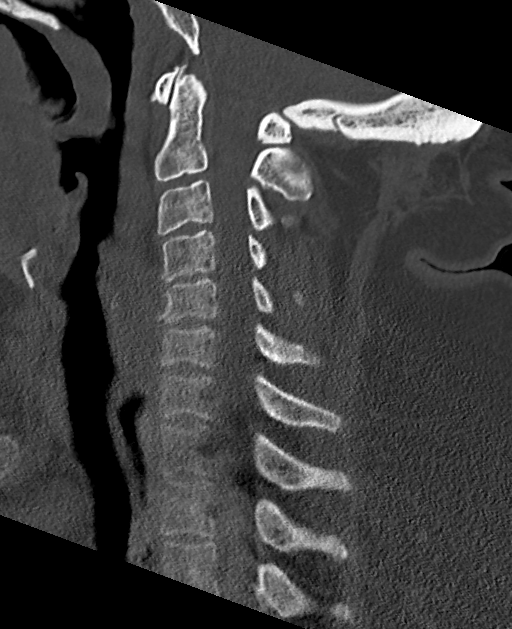
[im 41/61  bone]
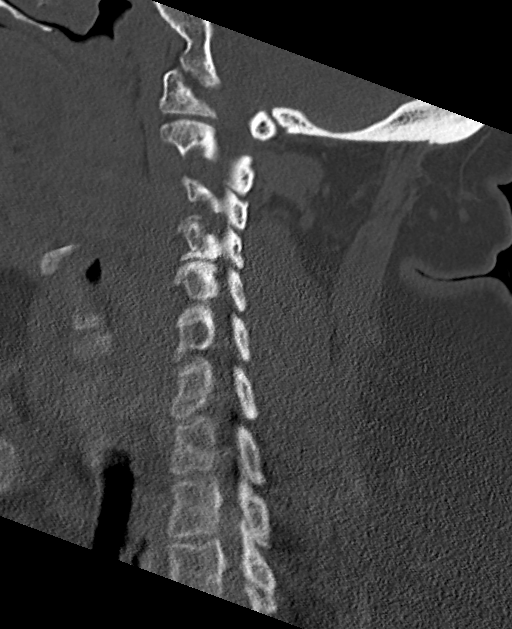

[Series 9: orthogonals · axial · 0.23mm/px · z∈[+1039,+1151]mm · 5 of 91 slices shown, 7 images]
[im 16/91  soft-tissue]
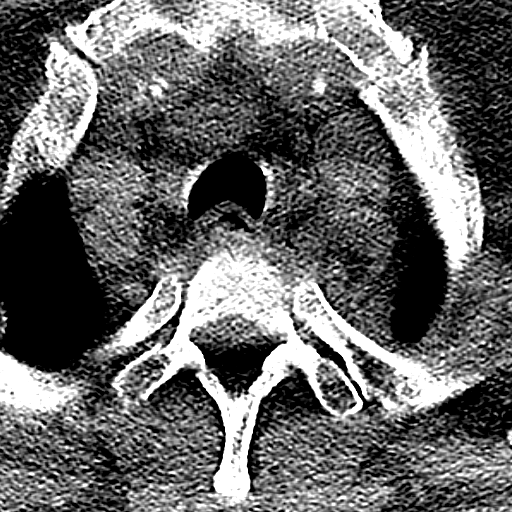
[im 16/91  bone]
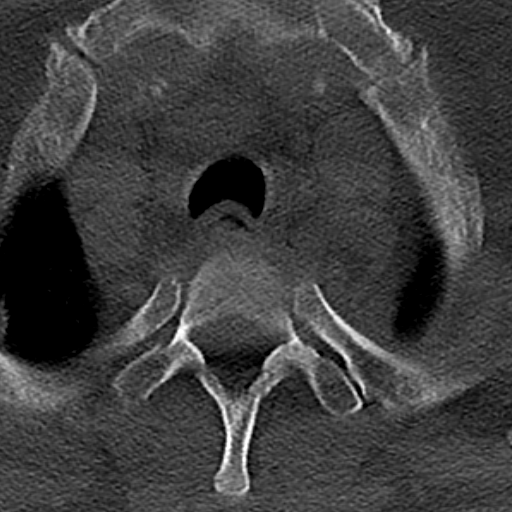
[im 31/91  bone]
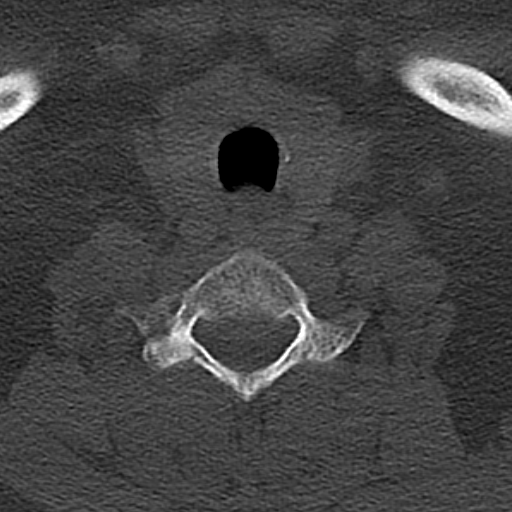
[im 46/91  bone]
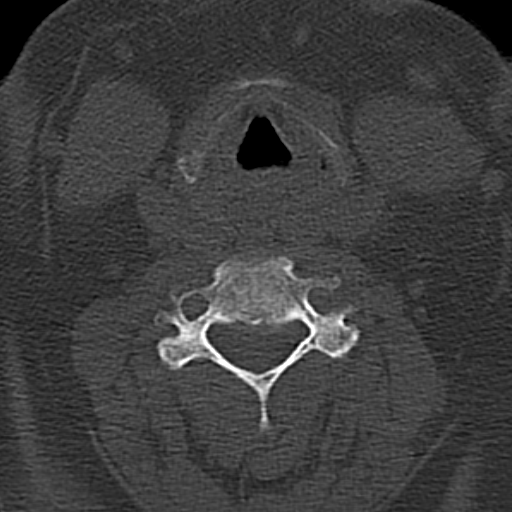
[im 61/91  bone]
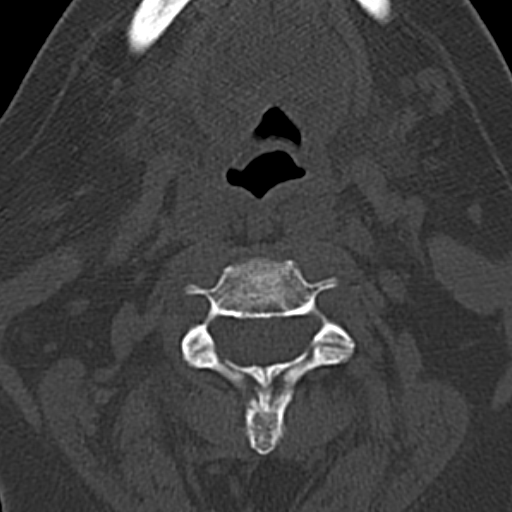
[im 76/91  soft-tissue]
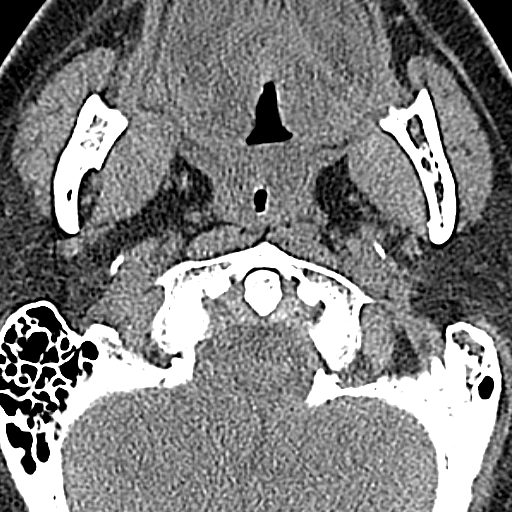
[im 76/91  bone]
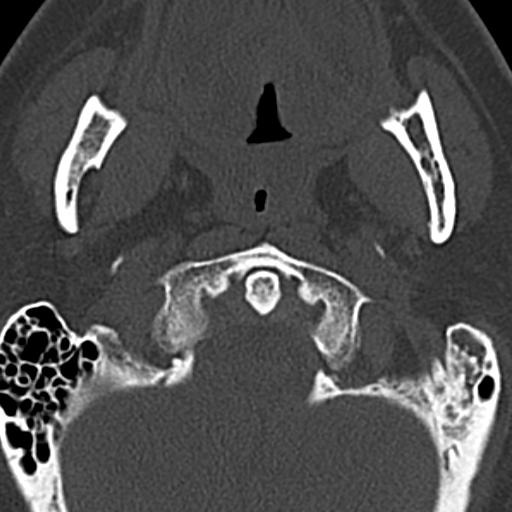

[15 of 33 positions shown; findings below may reference images not displayed]

FINDINGS: Examination is limited due to morbid obesity. The cervical vertebral
bodies are grossly normally aligned. No acute fracture is
identified. The facets are normally aligned. No facet or laminar
fractures. The skullbase C1 and C1-2 articulations are maintained.
The dens is normal.
IMPRESSION: Normal alignment and no acute bony findings.

## 2016-11-29 NOTE — Telephone Encounter (Signed)
Note was made

## 2016-11-30 ENCOUNTER — Telehealth (INDEPENDENT_AMBULATORY_CARE_PROVIDER_SITE_OTHER): Payer: Self-pay | Admitting: Orthopaedic Surgery

## 2016-11-30 DIAGNOSIS — J441 Chronic obstructive pulmonary disease with (acute) exacerbation: Secondary | ICD-10-CM | POA: Diagnosis not present

## 2016-11-30 DIAGNOSIS — S82201D Unspecified fracture of shaft of right tibia, subsequent encounter for closed fracture with routine healing: Secondary | ICD-10-CM | POA: Diagnosis not present

## 2016-11-30 DIAGNOSIS — E1122 Type 2 diabetes mellitus with diabetic chronic kidney disease: Secondary | ICD-10-CM | POA: Diagnosis not present

## 2016-11-30 DIAGNOSIS — E1140 Type 2 diabetes mellitus with diabetic neuropathy, unspecified: Secondary | ICD-10-CM | POA: Diagnosis not present

## 2016-11-30 DIAGNOSIS — I130 Hypertensive heart and chronic kidney disease with heart failure and stage 1 through stage 4 chronic kidney disease, or unspecified chronic kidney disease: Secondary | ICD-10-CM | POA: Diagnosis not present

## 2016-11-30 DIAGNOSIS — I5030 Unspecified diastolic (congestive) heart failure: Secondary | ICD-10-CM | POA: Diagnosis not present

## 2016-11-30 DIAGNOSIS — D631 Anemia in chronic kidney disease: Secondary | ICD-10-CM | POA: Diagnosis not present

## 2016-11-30 DIAGNOSIS — N182 Chronic kidney disease, stage 2 (mild): Secondary | ICD-10-CM | POA: Diagnosis not present

## 2016-11-30 DIAGNOSIS — I272 Pulmonary hypertension, unspecified: Secondary | ICD-10-CM | POA: Diagnosis not present

## 2016-11-30 NOTE — Telephone Encounter (Signed)
Patient called asked if she can get a copy of the Rx for (PT) Patient said she lost hers. Patient asked how many days should she go to (PT) The number to contact patient is [phone]

## 2016-11-30 NOTE — Telephone Encounter (Signed)
Called patient no answer LMOM Xray copies are ready for pick up at the front desk. And about PT it will be up to the therapists decision on how long she will need to go.

## 2016-12-01 ENCOUNTER — Other Ambulatory Visit: Payer: Self-pay

## 2016-12-01 ENCOUNTER — Ambulatory Visit (INDEPENDENT_AMBULATORY_CARE_PROVIDER_SITE_OTHER): Payer: Medicare HMO | Admitting: Internal Medicine

## 2016-12-01 ENCOUNTER — Encounter: Payer: Self-pay | Admitting: Internal Medicine

## 2016-12-01 VITALS — BP 121/80 | HR 83 | Temp 99.3°F | Ht 64.0 in | Wt 290.0 lb

## 2016-12-01 DIAGNOSIS — I5032 Chronic diastolic (congestive) heart failure: Secondary | ICD-10-CM | POA: Diagnosis not present

## 2016-12-01 DIAGNOSIS — R0981 Nasal congestion: Secondary | ICD-10-CM

## 2016-12-01 MED ORDER — FLUNISOLIDE 25 MCG/ACT (0.025%) NA SOLN: 2.0000 | Freq: Every day | NASAL | 0 refills | Status: DC

## 2016-12-01 MED ORDER — FLUTICASONE FUROATE-VILANTEROL 100-25 MCG/INH IN AEPB: INHALATION_SPRAY | RESPIRATORY_TRACT | 0 refills | Status: DC

## 2016-12-01 MED ORDER — CETIRIZINE HCL 10 MG PO TABS: 10.0000 mg | ORAL_TABLET | Freq: Every day | ORAL | 1 refills | Status: DC

## 2016-12-01 NOTE — Patient Instructions (Signed)
Please follow-up with me in 1 week.  You are looking really good today I do not have any concerns.  We will continue your current diuretic regimen however I think it is important to consider your kidney function in the future.  I am also going to give you a couple of medications to help with the congestion.  I am going to give you a nasal decongestant to use daily and then I want you to start taking Zyrtec as well.  I am very pleased with how well you have done lately.

## 2016-12-01 NOTE — Progress Notes (Signed)
   [name] [name], MD Phone: [phone]  Reason For Visit: Follow-up  # SOB  -Patient states that she is feeling significantly better.  Last time she was here they increase the metolazone to Tuesday and Thursday.  [name] also been taking the torsemide 80 twice daily -Patient denies shortness of breath, orthopnea, PND -She has been following with nutrition about weight loss - She feels much better than she has been in the past.  She has not been to the hospital for the past month and a half and feels very proud of this -She has been taking her potassium as well as discussed -She followed up with pulmonology on Tuesday and they feel like she is doing well.  She forgot to ask them for refill on her on her Memory Dance and would like me to refill this medication  Nasal congestion -Patient has been having nasal congestion for about 2-3 days -She feels like the oxygen may irritate her nasal passages at times -[name] currently taking Singulair she has not been taking Flonase she states that this does not help very much she would like to try another nasal steroid -She denies any cough or associated headaches -Denies any fevers or chills -Denies any sore throat   Past Medical History Reviewed problem list.  Medications- reviewed and updated No additions to family history Social history- patient is a previous smoker  Objective: BP 121/80 (BP Location: Right Wrist, Patient Position: Sitting, Cuff Size: Normal)   Pulse 83   Temp 99.3 F (37.4 C) (Oral)   Ht _0  (1.626 m)   Wt 290 lb (131.5 kg)   SpO2 92%   BMI 49.78 kg/m  Gen: NAD, alert, cooperative with exam HEENT: Normal, no maxillary or ethmoid tenderness    Neck: No masses palpated. No lymphadenopathy    Nose: nasal turbinates moist slight congested     Throat: moist mucus membranes, no erythema Cardio: regular rate and rhythm, S1S2 heard, no murmurs appreciated Pulm: clear to auscultation  bilaterally, no wheezes, rhonchi or rales Extremities: warm, well perfused, No edema, cyanosis or clubbing;    Assessment/Plan: See problem based a/p  Chronic diastolic congestive heart failure (HCC) Previously patient's dry weight was considered to be as 298pounds.  Patient was seen by Dr. Avon Gully with a weight of 301 pounds stating she was feeling short of breath Dr. Avon Gully increased her metolazone toTuesday and Thursday, along with continued torsemide. Currently patient is to 290 pounds. Patient denies any signs or symptoms of volume overload.  On examination patient appears to be euvolemic.  Previous bmet showing an increasing serum creatinine so for me some concern for patient being too dry. -Since patient feels so well will continue metolazone Tuesday and Thursday along with torsemide 80 mg twice daily -We will obtain a be met to check on serum creatinine if patient's creatinine is continuing to rise we will discuss decreasing metolazone with patient -Continue K-Dur for replacement of potassium -Follow-up in 1 week  Nasal congestion Patient with nasal congestion.  Currently patient is on Singulair -Will add Zyrtec and a nasal steroid other than Flonase as patient states she does not like this medication - cetirizine (ZYRTEC) 10 MG tablet; Take 1 tablet (10 mg total) daily by mouth.  Dispense: 30 tablet; Refill: 1 - flunisolide (NASALIDE) 25 MCG/ACT (0.025%) SOLN; Place 2 sprays daily into the nose.  Dispense: 1 Bottle; Refill: 0 -Follow-up in 1 week

## 2016-12-02 DIAGNOSIS — J45909 Unspecified asthma, uncomplicated: Secondary | ICD-10-CM | POA: Diagnosis not present

## 2016-12-02 DIAGNOSIS — J189 Pneumonia, unspecified organism: Secondary | ICD-10-CM | POA: Diagnosis not present

## 2016-12-02 DIAGNOSIS — J440 Chronic obstructive pulmonary disease with acute lower respiratory infection: Secondary | ICD-10-CM | POA: Diagnosis not present

## 2016-12-02 DIAGNOSIS — G4733 Obstructive sleep apnea (adult) (pediatric): Secondary | ICD-10-CM | POA: Diagnosis not present

## 2016-12-02 DIAGNOSIS — J449 Chronic obstructive pulmonary disease, unspecified: Secondary | ICD-10-CM | POA: Diagnosis not present

## 2016-12-02 DIAGNOSIS — R062 Wheezing: Secondary | ICD-10-CM | POA: Diagnosis not present

## 2016-12-02 DIAGNOSIS — I509 Heart failure, unspecified: Secondary | ICD-10-CM | POA: Diagnosis not present

## 2016-12-02 DIAGNOSIS — M6281 Muscle weakness (generalized): Secondary | ICD-10-CM | POA: Diagnosis not present

## 2016-12-02 DIAGNOSIS — R0602 Shortness of breath: Secondary | ICD-10-CM | POA: Diagnosis not present

## 2016-12-02 LAB — BASIC METABOLIC PANEL
BUN/Creatinine Ratio: 17 (ref 9–23)
BUN: 35 mg/dL — ABNORMAL HIGH (ref 6–24)
CO2: 34 mmol/L — ABNORMAL HIGH (ref 20–29)
Calcium: 10 mg/dL (ref 8.7–10.2)
Chloride: 96 mmol/L (ref 96–106)
Creatinine, Ser: 2.05 mg/dL — ABNORMAL HIGH (ref 0.57–1.00)
GFR calc Af Amer: 30 mL/min/{1.73_m2} — ABNORMAL LOW (ref >59)
GFR calc non Af Amer: 26 mL/min/{1.73_m2} — ABNORMAL LOW (ref >59)
Glucose: 83 mg/dL (ref 65–99)
Potassium: 3.1 mmol/L — ABNORMAL LOW (ref 3.5–5.2)
Sodium: 146 mmol/L — ABNORMAL HIGH (ref 134–144)

## 2016-12-04 ENCOUNTER — Telehealth (INDEPENDENT_AMBULATORY_CARE_PROVIDER_SITE_OTHER): Payer: Self-pay

## 2016-12-04 ENCOUNTER — Other Ambulatory Visit: Payer: Self-pay | Admitting: Internal Medicine

## 2016-12-04 DIAGNOSIS — I272 Pulmonary hypertension, unspecified: Secondary | ICD-10-CM | POA: Diagnosis not present

## 2016-12-04 DIAGNOSIS — E1122 Type 2 diabetes mellitus with diabetic chronic kidney disease: Secondary | ICD-10-CM | POA: Diagnosis not present

## 2016-12-04 DIAGNOSIS — D631 Anemia in chronic kidney disease: Secondary | ICD-10-CM | POA: Diagnosis not present

## 2016-12-04 DIAGNOSIS — J441 Chronic obstructive pulmonary disease with (acute) exacerbation: Secondary | ICD-10-CM | POA: Diagnosis not present

## 2016-12-04 DIAGNOSIS — N182 Chronic kidney disease, stage 2 (mild): Secondary | ICD-10-CM | POA: Diagnosis not present

## 2016-12-04 DIAGNOSIS — S82201D Unspecified fracture of shaft of right tibia, subsequent encounter for closed fracture with routine healing: Secondary | ICD-10-CM | POA: Diagnosis not present

## 2016-12-04 DIAGNOSIS — I130 Hypertensive heart and chronic kidney disease with heart failure and stage 1 through stage 4 chronic kidney disease, or unspecified chronic kidney disease: Secondary | ICD-10-CM | POA: Diagnosis not present

## 2016-12-04 DIAGNOSIS — I5030 Unspecified diastolic (congestive) heart failure: Secondary | ICD-10-CM | POA: Diagnosis not present

## 2016-12-04 DIAGNOSIS — E1140 Type 2 diabetes mellitus with diabetic neuropathy, unspecified: Secondary | ICD-10-CM | POA: Diagnosis not present

## 2016-12-04 MED ORDER — METFORMIN HCL ER 500 MG PO TB24: 500.0000 mg | ORAL_TABLET | Freq: Two times a day (BID) | ORAL | 0 refills | Status: DC

## 2016-12-04 NOTE — Telephone Encounter (Signed)
Please advise.   Per Gabriel Cirri- sent me a Skype.  "Hey I just received a call from [place] at [facility] wanting verbal orders for pt Tiffany [name] to have OT consult now that she is out of boot and weight bearing and interested in getting shower. is it ok to give the order?"

## 2016-12-04 NOTE — Telephone Encounter (Signed)
yes

## 2016-12-04 NOTE — Telephone Encounter (Signed)
Pt needs refill on her metformin.  [place]

## 2016-12-04 NOTE — Assessment & Plan Note (Signed)
Previously patient's dry weight was considered to be as 298pounds.  Patient was seen by Dr. Avon Gully with a weight of 301 pounds stating she was feeling short of breath Dr. Avon Gully increased her metolazone toTuesday and Thursday, along with continued torsemide. Currently patient is to 290 pounds. Patient denies any signs or symptoms of volume overload.  On examination patient appears to be euvolemic.  Previous bmet showing an increasing serum creatinine so for me some concern for patient being too dry. -Since patient feels so well will continue metolazone Tuesday and Thursday along with torsemide 80 mg twice daily -We will obtain a be met to check on serum creatinine if patient's creatinine is continuing to rise we will discuss decreasing metolazone with patient -Continue K-Dur for replacement of potassium -Follow-up in 1 week

## 2016-12-04 NOTE — Assessment & Plan Note (Signed)
Patient with nasal congestion.  Currently patient is on Singulair -Will add Zyrtec and a nasal steroid other than Flonase as patient states she does not like this medication - cetirizine (ZYRTEC) 10 MG tablet; Take 1 tablet (10 mg total) daily by mouth.  Dispense: 30 tablet; Refill: 1 - flunisolide (NASALIDE) 25 MCG/ACT (0.025%) SOLN; Place 2 sprays daily into the nose.  Dispense: 1 Bottle; Refill: 0 -Follow-up in 1 week

## 2016-12-05 ENCOUNTER — Other Ambulatory Visit: Payer: Self-pay

## 2016-12-05 ENCOUNTER — Encounter: Payer: Self-pay | Admitting: Internal Medicine

## 2016-12-05 ENCOUNTER — Ambulatory Visit (INDEPENDENT_AMBULATORY_CARE_PROVIDER_SITE_OTHER): Payer: Medicare HMO | Admitting: Internal Medicine

## 2016-12-05 ENCOUNTER — Telehealth (INDEPENDENT_AMBULATORY_CARE_PROVIDER_SITE_OTHER): Payer: Self-pay | Admitting: Orthopaedic Surgery

## 2016-12-05 VITALS — BP 132/82 | HR 80 | Temp 98.7°F | Ht 64.0 in | Wt 292.4 lb

## 2016-12-05 DIAGNOSIS — I5032 Chronic diastolic (congestive) heart failure: Secondary | ICD-10-CM

## 2016-12-05 MED ORDER — POTASSIUM CHLORIDE ER 10 MEQ PO TBCR: 50.0000 meq | EXTENDED_RELEASE_TABLET | Freq: Every day | ORAL | 1 refills | Status: DC

## 2016-12-05 MED ORDER — POTASSIUM CHLORIDE ER 10 MEQ PO TBCR: 60.0000 meq | EXTENDED_RELEASE_TABLET | Freq: Every day | ORAL | 1 refills | Status: DC

## 2016-12-05 NOTE — Telephone Encounter (Signed)
Patient called stating that she misplaced the order for PT Dr. Erlinda Hong gave her.  [name] wanting to know if she could get another one.  CB#[phone].  Thank you.

## 2016-12-05 NOTE — Patient Instructions (Signed)
Please do not take any more metolazone this week.  I am going to increase your potassium by one pill from 50 mg to 60 mg.  I want you to follow-up with me in 1 week.

## 2016-12-05 NOTE — Progress Notes (Signed)
   [name] [name], MD Phone: [phone]  Reason For Visit: Follow up   Chronic diastolic dysfunction #Patient is doing well today.  She denies any shortness of breath.  She denies any orthopnea.  She denies any PND.  She denies any swelling in her legs.  She denies any abdominal swelling.  She has been taking her torsemide twice daily as well as the metolazone.  She took metolazone last on Monday.  She has also been taking her K-Dur 5 tablets once daily.  She states overall she is improving.  She has been walking more.  Physical therapy is going well.  Past Medical History Reviewed problem list.  Medications- reviewed and updated No additions to family history Social history- patient is a previous smoker  Objective: BP 132/82   Pulse 80   Temp 98.7 F (37.1 C) (Oral)   Ht 5\' 4"  (1.626 m)   Wt 292 lb 6.4 oz (132.6 kg)   SpO2 96%   BMI 50.19 kg/m  Gen: NAD, alert, cooperative with exam HEENT: Normal    Nose: nasal turbinates moist    Throat: Slightly chapped lips, Cardio: regular rate and rhythm, S1S2 heard, no murmurs appreciated Pulm: clear to auscultation bilaterally, no wheezes, rhonchi or rales GI: soft, non-tender, non-distended, bowel sounds present, no hepatomegaly, no splenomegaly Extremities: warm, well perfused, No edema, cyanosis or clubbing;    Assessment/Plan: See problem based a/p  Chronic diastolic congestive heart failure (HCC) Overall clinically patient is doing well, slightly dry on examination.  BMET consistent dryness with serum creatinine elevated to 2 baseline around 1.5 .Patient also noted to be slightly hypokalemic -Discussed with patient stopping metolazone completely, continuing torsemide 80 twice daily -Repeat BMET today -Increase potassium by 10 mg to 60 K-Dur-cautious as patient with changes in GFR -Follow-up in 1 week

## 2016-12-06 ENCOUNTER — Other Ambulatory Visit (INDEPENDENT_AMBULATORY_CARE_PROVIDER_SITE_OTHER): Payer: Self-pay

## 2016-12-06 ENCOUNTER — Ambulatory Visit (HOSPITAL_COMMUNITY): Payer: Self-pay | Admitting: Psychiatry

## 2016-12-06 DIAGNOSIS — S82221D Displaced transverse fracture of shaft of right tibia, subsequent encounter for closed fracture with routine healing: Secondary | ICD-10-CM

## 2016-12-06 LAB — BASIC METABOLIC PANEL
BUN/Creatinine Ratio: 13 (ref 9–23)
BUN: 22 mg/dL (ref 6–24)
CO2: 33 mmol/L — ABNORMAL HIGH (ref 20–29)
Calcium: 9.9 mg/dL (ref 8.7–10.2)
Chloride: 94 mmol/L — ABNORMAL LOW (ref 96–106)
Creatinine, Ser: 1.69 mg/dL — ABNORMAL HIGH (ref 0.57–1.00)
GFR calc Af Amer: 38 mL/min/{1.73_m2} — ABNORMAL LOW (ref >59)
GFR calc non Af Amer: 33 mL/min/{1.73_m2} — ABNORMAL LOW (ref >59)
Glucose: 91 mg/dL (ref 65–99)
Potassium: 3.1 mmol/L — ABNORMAL LOW (ref 3.5–5.2)
Sodium: 144 mmol/L (ref 134–144)

## 2016-12-06 NOTE — Assessment & Plan Note (Signed)
Overall clinically patient is doing well, slightly dry on examination.  BMET consistent dryness with serum creatinine elevated to 2 baseline around 1.5 .Patient also noted to be slightly hypokalemic -Discussed with patient stopping metolazone completely, continuing torsemide 80 twice daily -Repeat BMET today -Increase potassium by 10 mg to 60 K-Dur-cautious as patient with changes in GFR -Follow-up in 1 week

## 2016-12-06 NOTE — Telephone Encounter (Signed)
[facility] on [address]

## 2016-12-06 NOTE — Telephone Encounter (Signed)
PT order made

## 2016-12-08 DIAGNOSIS — R0602 Shortness of breath: Secondary | ICD-10-CM | POA: Diagnosis not present

## 2016-12-10 DIAGNOSIS — R0602 Shortness of breath: Secondary | ICD-10-CM | POA: Diagnosis not present

## 2016-12-10 DIAGNOSIS — R062 Wheezing: Secondary | ICD-10-CM | POA: Diagnosis not present

## 2016-12-10 DIAGNOSIS — J440 Chronic obstructive pulmonary disease with acute lower respiratory infection: Secondary | ICD-10-CM | POA: Diagnosis not present

## 2016-12-10 DIAGNOSIS — M6281 Muscle weakness (generalized): Secondary | ICD-10-CM | POA: Diagnosis not present

## 2016-12-10 DIAGNOSIS — J449 Chronic obstructive pulmonary disease, unspecified: Secondary | ICD-10-CM | POA: Diagnosis not present

## 2016-12-10 DIAGNOSIS — J45909 Unspecified asthma, uncomplicated: Secondary | ICD-10-CM | POA: Diagnosis not present

## 2016-12-10 DIAGNOSIS — J9620 Acute and chronic respiratory failure, unspecified whether with hypoxia or hypercapnia: Secondary | ICD-10-CM | POA: Diagnosis not present

## 2016-12-10 DIAGNOSIS — J189 Pneumonia, unspecified organism: Secondary | ICD-10-CM | POA: Diagnosis not present

## 2016-12-10 DIAGNOSIS — G4733 Obstructive sleep apnea (adult) (pediatric): Secondary | ICD-10-CM | POA: Diagnosis not present

## 2016-12-10 DIAGNOSIS — I509 Heart failure, unspecified: Secondary | ICD-10-CM | POA: Diagnosis not present

## 2016-12-11 ENCOUNTER — Ambulatory Visit (INDEPENDENT_AMBULATORY_CARE_PROVIDER_SITE_OTHER): Payer: Medicare HMO | Admitting: Psychiatry

## 2016-12-11 ENCOUNTER — Encounter (HOSPITAL_COMMUNITY): Payer: Self-pay | Admitting: Psychiatry

## 2016-12-11 DIAGNOSIS — F200 Paranoid schizophrenia: Secondary | ICD-10-CM

## 2016-12-11 DIAGNOSIS — Z87891 Personal history of nicotine dependence: Secondary | ICD-10-CM | POA: Diagnosis not present

## 2016-12-11 DIAGNOSIS — F1421 Cocaine dependence, in remission: Secondary | ICD-10-CM

## 2016-12-11 DIAGNOSIS — Z79899 Other long term (current) drug therapy: Secondary | ICD-10-CM

## 2016-12-11 DIAGNOSIS — Z818 Family history of other mental and behavioral disorders: Secondary | ICD-10-CM

## 2016-12-11 MED ORDER — ARIPIPRAZOLE 5 MG PO TABS: ORAL_TABLET | ORAL | 1 refills | Status: DC

## 2016-12-11 MED ORDER — MIRTAZAPINE 45 MG PO TBDP: 45.0000 mg | ORAL_TABLET | Freq: Every day | ORAL | 1 refills | Status: DC

## 2016-12-11 MED ORDER — ESCITALOPRAM OXALATE 20 MG PO TABS: 20.0000 mg | ORAL_TABLET | Freq: Every day | ORAL | 1 refills | Status: DC

## 2016-12-11 NOTE — Progress Notes (Signed)
[facility] MD/PA/NP OP Progress Note  12/11/2016 12:10 PM Tiffany [name]  MRN:  [id number]  Chief Complaint: I am feeling better with Abilify.  I am less irritable  HPI: Patient came for her follow-up appointment.  [name] taking Abilify but she forgot to stop the Geodon and she continue Geodon 80 mg at bedtime.  Patient was told not to take Geodon and Abilify together due to increased risk of side effects but patient insists that she needs Geodon because it is helping her sleep.  [name] taking Lexapro and Remeron.  After some discussion she agreed to stop the Geodon and we will increase the Remeron to 45 mg.  Overall she describes her mood is better.  She denies any crying spells or any feeling of hopelessness or worthlessness.  [name] very concerned about her physical health.  Recently she seen her primary care physician and she had blood work.  Her potassium is low and her creatinine improved from the past.  She has no tremors or shakes.  [name] no longer taking [name].  She admitted her mood swings and irritability anger is much better since she started the Abilify.  [name] scheduled to see therapist on 13th.  Patient also feels proud that she is no longer using cocaine or any other drugs.  She has shortness of breath and she uses a wheelchair and walker at home.  Visit Diagnosis:    ICD-10-CM   1. Paranoid schizophrenia ([place]) F20.0 mirtazapine (REMERON SOL-TAB) 45 MG disintegrating tablet    escitalopram (LEXAPRO) 20 MG tablet    ARIPiprazole (ABILIFY) 5 MG tablet    Past Psychiatric History: Reviewed. Patient has multiple psychiatric hospitalization due to drug use and mental illness.  Her last hospital and was in November 2017 at [facility].  She had psychiatric illness started in 05/03/1984 when her daughter died due to cancer.  In the past she had tried Abilify, Risperdal, Wellbutrin, Xanax, Klonopin, trazodone and BuSpar.  She saw psychiatrist at [facility].  Patient denies any history of  suicidal attempt but admitted history of suicidal thoughts.  She has history of paranoia, delusion, anxiety, manic-like symptoms.  She used to take Geodon 180 mg but does reduced due to high QTC interval.   Past Medical History:  Past Medical History:  Diagnosis Date  . Anxiety   . Arthritis    "all over" (04/10/2016)  . Asthma   . Cardiac arrest ([place]) 09/08/2016   PEA  . Carotid artery stenosis    1-39% bilateral by dopplers 11/2016  . Chronic bronchitis ([place])   . Chronic diastolic (congestive) heart failure ([place])   . Chronic kidney disease    "I see a kidney dr." (04/10/2016)  . Cocaine abuse ([place])   . Complication of anesthesia    decreased bp, decreased heart rate  . Depression   . Disorder of nervous system   . Emphysema   . GERD (gastroesophageal reflux disease)   . Heart attack ([place]) 1980s  . History of blood transfusion 1994   "couldn't stop bleeding from my period"  . Hyperlipidemia LDL goal <70   . Hypertension   . Incontinence   . Manic depression ([place])   . On home oxygen therapy    "6L; 24/7" (04/10/2016)  . OSA on CPAP    "wear mask sometimes" (04/10/2016)  . Paranoid ([place])    "sometimes; I'm on RX for it" (04/10/2016)  . Pneumonia    "I've had it several times; haven't  had it since 06/2015" (04/10/2016)  . Schizophrenia ([place])   . Seasonal allergies   . Seizures ([place])    "don't know what kind; last one was ~ 1 yr ago" (04/10/2016)  . Sinus trouble   . Stroke [facility]) 1980s   denies residual on 04/10/2016  . Type II diabetes mellitus ([place])     Past Surgical History:  Procedure Laterality Date  . CESAREAN SECTION  1997  . HERNIA REPAIR    . IR CHOLANGIOGRAM EXISTING TUBE  07/20/2016  . IR PERC CHOLECYSTOSTOMY  05/10/2016  . IR RADIOLOGIST EVAL & MGMT  06/08/2016  . IR RADIOLOGIST EVAL & MGMT  06/29/2016  . IR SINUS/FIST TUBE CHK-NON GI  07/12/2016  . TIBIA IM NAIL INSERTION Right 07/12/2016   Procedure: INTRAMEDULLARY (IM) NAIL RIGHT TIBIA;  Surgeon: Leandrew Koyanagi,  MD;  Location: [facility];  Service: Orthopedics;  Laterality: Right;  . UMBILICAL HERNIA REPAIR  ~ 1963   "that's why I don't have a belly button"  . VAGINAL HYSTERECTOMY      Family Psychiatric History: Reviewed.  Family History:  Family History  Problem Relation Age of Onset  . Cancer Father        prostate  . Cancer Mother        lung  . Depression Mother   . Depression Sister   . Anxiety disorder Sister   . Schizophrenia Sister   . Bipolar disorder Sister   . Depression Sister   . Depression Brother   . Heart failure Unknown        cousin    Social History:  Social History   Socioeconomic History  . Marital status: Widowed    Spouse name: None  . Number of children: 3  . Years of education: None  . Highest education level: None  Social Needs  . Financial resource strain: None  . Food insecurity - worry: None  . Food insecurity - inability: None  . Transportation needs - medical: None  . Transportation needs - non-medical: None  Occupational History  . Occupation: disabled    Comment: factory production  Tobacco Use  . Smoking status: Former Smoker    Packs/day: 1.50    Years: 38.00    Pack years: 57.00    Types: Cigarettes    Start date: 03/13/1977    Last attempt to quit: 04/10/2016    Years since quitting: 0.6  . Smokeless tobacco: Never Used  Substance and Sexual Activity  . Alcohol use: No    Alcohol/week: 0.0 oz  . Drug use: No    Comment: 04/10/2016 "last used cocaine back in November 2017"  . Sexual activity: No    Birth control/protection: Surgical  Other Topics Concern  . None  Social History Narrative   Has 1 son, [name]   Lives with son and his boyfriend   Her house has ramps and handrails should she ever needs them.    Her mother lives down the street from her and is a good support person in addition to her son.   She drives herself, has private transportation.    Cocaine free since 02/24/16, smoke free since 04/10/16    Allergies:   Allergies  Allergen Reactions  . Hydrocodone-Acetaminophen Shortness Of Breath  . Hydroxyzine Anaphylaxis and Shortness Of Breath  . Latuda [Lurasidone Hcl] Anaphylaxis  . Codeine Nausea And Vomiting  . Sulfa Antibiotics Itching    Metabolic Disorder Labs: Lab Results  Component Value Date   HGBA1C 5.5  11/03/2016   MPG 111.15 11/03/2016   MPG 120 04/10/2016   No results found for: PROLACTIN Lab Results  Component Value Date   CHOL 113 07/10/2016   TRIG 91 07/10/2016   HDL 51 07/10/2016   CHOLHDL 2.2 07/10/2016   VLDL 18 07/10/2016   LDLCALC 44 07/10/2016   LDLCALC 62 12/04/2015   Lab Results  Component Value Date   TSH 0.563 03/15/2016   TSH 1.060 12/04/2015    Therapeutic Level Labs: No results found for: LITHIUM Lab Results  Component Value Date   VALPROATE 20 (L) 09/09/2016   VALPROATE 46 (L) 07/10/2016   No components found for:  CBMZ  Current Medications: Current Outpatient Medications  Medication Sig Dispense Refill  . albuterol (PROVENTIL HFA;VENTOLIN HFA) 108 (90 Base) MCG/ACT inhaler Inhale 2 puffs every 6 (six) hours as needed into the lungs for wheezing or shortness of breath. 1 Inhaler 0  . albuterol (PROVENTIL) (2.5 MG/3ML) 0.083% nebulizer solution Take 3 mLs (2.5 mg total) every 6 (six) hours as needed by nebulization for wheezing or shortness of breath. 150 mL 1  . amLODipine (NORVASC) 5 MG tablet Take 1 tablet (5 mg total) daily by mouth. 90 tablet 0  . ARIPiprazole (ABILIFY) 5 MG tablet Take 1/2 tab daily for 1 week and than 1 tab adily 30 tablet 1  . aspirin 81 MG EC tablet Take 1 tablet (81 mg total) by mouth daily. Swallow whole. 90 tablet 0  . atorvastatin (LIPITOR) 80 MG tablet Take 1 tablet (80 mg total) daily by mouth. 60 tablet 1  . cetirizine (ZYRTEC) 10 MG tablet Take 1 tablet (10 mg total) daily by mouth. 30 tablet 1  . escitalopram (LEXAPRO) 20 MG tablet Take 1 tablet (20 mg total) by mouth daily. 30 tablet 1  . flunisolide  (NASALIDE) 25 MCG/ACT (0.025%) SOLN Place 2 sprays daily into the nose. 1 Bottle 0  . fluticasone furoate-vilanterol (BREO ELLIPTA) 100-25 MCG/INH AEPB INHALE 1 PUFF EVERY DAY 180 each 0  . gabapentin (NEURONTIN) 600 MG tablet Take 1 tablet (600 mg total) by mouth 3 (three) times daily. 270 tablet 0  . guaiFENesin (MUCINEX) 600 MG 12 hr tablet Take 1 tablet (600 mg total) by mouth 2 (two) times daily. 30 tablet 0  . metFORMIN (GLUCOPHAGE-XR) 500 MG 24 hr tablet Take 1 tablet (500 mg total) 2 (two) times daily by mouth. 180 tablet 0  . metolazone (ZAROXOLYN) 2.5 MG tablet Take 1 tablet 2 days per week (Tuesdays and Fridays). Take 30 minutes before taking Torsemide 24 tablet 3  . mirtazapine (REMERON SOL-TAB) 30 MG disintegrating tablet Take 1 tablet (30 mg total) by mouth at bedtime. 30 tablet 1  . montelukast (SINGULAIR) 10 MG tablet Take 1 tablet (10 mg total) by mouth at bedtime. 90 tablet 1  . Multiple Vitamin (MULTIVITAMIN WITH MINERALS) TABS tablet Take 1 tablet by mouth daily.    [name] omeprazole (PRILOSEC) 40 MG capsule Take 1 capsule (40 mg total) by mouth 2 (two) times daily. in the morning and evebing 56 capsule 2  . OXYGEN Inhale 6 L into the lungs daily.    . potassium chloride (K-DUR) 10 MEQ tablet Take 6 tablets (60 mEq total) by mouth daily. 60 tablet 1  . tiotropium (SPIRIVA HANDIHALER) 18 MCG inhalation capsule Place 1 capsule (18 mcg total) into inhaler and inhale daily. 90 capsule 1  . torsemide (DEMADEX) 20 MG tablet Take 4 tablets (80 mg total) by mouth 2 (two) times daily.  720 tablet 3  . Trolamine Salicylate 10 % LOTN Apply 1 patch topically 3 (three) times daily as needed. 141 g 2   No current facility-administered medications for this visit.      Musculoskeletal: Strength & Muscle Tone: within normal limits Gait & Station: unsteady Patient leans: Left, Front and Backward  Psychiatric Specialty Exam: ROS  Blood pressure 134/78, pulse 88, height 5\' 4"  (1.626 m), weight  300 lb (136.1 kg).Body mass index is 51.49 kg/m.  General Appearance: Casual and Using oxygen  Eye Contact:  Good  Speech:  Slow  Volume:  Decreased  Mood:  Anxious  Affect:  Appropriate  Thought Process:  Goal Directed  Orientation:  Full (Time, Place, and Person)  Thought Content: Rumination   Suicidal Thoughts:  No  Homicidal Thoughts:  No  Memory:  Immediate;   Fair Recent;   Fair Remote;   Fair  Judgement:  Good  Insight:  Good  Psychomotor Activity:  Decreased  Concentration:  Concentration: Fair and Attention Span: Fair  Recall:  [place] of Knowledge: Good  Language: Good  Akathisia:  No  Handed:  Right  AIMS (if indicated): not done  Assets:  Communication Skills Desire for Improvement Housing  ADL's:  Intact  Cognition: WNL  Sleep:  Fair   Screenings: AUDIT     Admission (Discharged) from 12/01/2015 in [facility]  Alcohol Use Disorder Identification Test Final Score (AUDIT)  0    PHQ2-9     Office Visit from 12/05/2016 in [facility] Office Visit from 12/01/2016 in [place] Office Visit from 11/23/2016 in [place] Office Visit from 11/15/2016 in [facility] Office Visit from 11/06/2016 in [facility]  PHQ-2 Total Score  0  0  0  0  0       Assessment and Plan: Schizophrenia chronic paranoid type.  Cocaine dependence in complete remission.  I reviewed her blood work results.  Her creatinine is improved from the past.  Her potassium remains low.  We discussed side effects by taking 2 antipsychotic medication after some discussion she agreed to stop the Geodon and we will increase Remeron 45 mg to help her sleep.  Patient is scheduled to see therapist on 13th.  Continue Lexapro 20 mg daily, Abilify 5 mg daily and he will try Remeron 45 mg at bedtime.  Discussed medication side effects and benefits.  Recommended to call us  back if she has any question or any concern.  Follow-up in 3 months.   Kathlee Nations, MD 12/11/2016, 12:10 PM

## 2016-12-13 ENCOUNTER — Other Ambulatory Visit: Payer: Self-pay | Admitting: Internal Medicine

## 2016-12-13 ENCOUNTER — Other Ambulatory Visit (HOSPITAL_COMMUNITY): Payer: Self-pay | Admitting: Psychiatry

## 2016-12-13 DIAGNOSIS — F200 Paranoid schizophrenia: Secondary | ICD-10-CM

## 2016-12-13 MED ORDER — AMLODIPINE BESYLATE 5 MG PO TABS: 5.0000 mg | ORAL_TABLET | Freq: Every day | ORAL | 0 refills | Status: DC

## 2016-12-13 MED ORDER — ATORVASTATIN CALCIUM 80 MG PO TABS: 80.0000 mg | ORAL_TABLET | Freq: Every day | ORAL | 3 refills | Status: DC

## 2016-12-13 MED ORDER — METFORMIN HCL ER 500 MG PO TB24: 500.0000 mg | ORAL_TABLET | Freq: Two times a day (BID) | ORAL | 0 refills | Status: DC

## 2016-12-13 NOTE — Telephone Encounter (Signed)
Patient wants prescription for [facility].Tiffany [name]

## 2016-12-13 NOTE — Telephone Encounter (Signed)
Tried calling patient concerning her Breo request. There was no answer and the voice mailbox is full.Tiffany [name]

## 2016-12-13 NOTE — Addendum Note (Signed)
Addended by: [name] Z on: 12/13/2016 02:07 PM   Modules accepted: Orders

## 2016-12-14 ENCOUNTER — Telehealth (HOSPITAL_COMMUNITY): Payer: Self-pay

## 2016-12-14 ENCOUNTER — Ambulatory Visit (HOSPITAL_COMMUNITY): Payer: Self-pay | Admitting: Licensed Clinical Social Worker

## 2016-12-14 ENCOUNTER — Ambulatory Visit (HOSPITAL_COMMUNITY)
Admission: RE | Admit: 2016-12-14 | Discharge: 2016-12-14 | Disposition: A | Discharge status: Home / Self Care | Payer: Medicare HMO | Source: Ambulatory Visit | Attending: Internal Medicine | Admitting: Internal Medicine

## 2016-12-14 VITALS — BP 112/68 | HR 82 | Wt 294.8 lb

## 2016-12-14 DIAGNOSIS — Z9981 Dependence on supplemental oxygen: Secondary | ICD-10-CM | POA: Insufficient documentation

## 2016-12-14 DIAGNOSIS — E876 Hypokalemia: Secondary | ICD-10-CM | POA: Insufficient documentation

## 2016-12-14 DIAGNOSIS — F329 Major depressive disorder, single episode, unspecified: Secondary | ICD-10-CM | POA: Diagnosis not present

## 2016-12-14 DIAGNOSIS — I6529 Occlusion and stenosis of unspecified carotid artery: Secondary | ICD-10-CM

## 2016-12-14 DIAGNOSIS — Z885 Allergy status to narcotic agent status: Secondary | ICD-10-CM | POA: Insufficient documentation

## 2016-12-14 DIAGNOSIS — Z888 Allergy status to other drugs, medicaments and biological substances status: Secondary | ICD-10-CM | POA: Insufficient documentation

## 2016-12-14 DIAGNOSIS — E785 Hyperlipidemia, unspecified: Secondary | ICD-10-CM | POA: Insufficient documentation

## 2016-12-14 DIAGNOSIS — F419 Anxiety disorder, unspecified: Secondary | ICD-10-CM | POA: Diagnosis not present

## 2016-12-14 DIAGNOSIS — G4733 Obstructive sleep apnea (adult) (pediatric): Secondary | ICD-10-CM | POA: Diagnosis not present

## 2016-12-14 DIAGNOSIS — Z8701 Personal history of pneumonia (recurrent): Secondary | ICD-10-CM | POA: Diagnosis not present

## 2016-12-14 DIAGNOSIS — Z87891 Personal history of nicotine dependence: Secondary | ICD-10-CM | POA: Insufficient documentation

## 2016-12-14 DIAGNOSIS — E118 Type 2 diabetes mellitus with unspecified complications: Secondary | ICD-10-CM

## 2016-12-14 DIAGNOSIS — Z8673 Personal history of transient ischemic attack (TIA), and cerebral infarction without residual deficits: Secondary | ICD-10-CM | POA: Insufficient documentation

## 2016-12-14 DIAGNOSIS — Z7984 Long term (current) use of oral hypoglycemic drugs: Secondary | ICD-10-CM | POA: Diagnosis not present

## 2016-12-14 DIAGNOSIS — Z801 Family history of malignant neoplasm of trachea, bronchus and lung: Secondary | ICD-10-CM | POA: Insufficient documentation

## 2016-12-14 DIAGNOSIS — I130 Hypertensive heart and chronic kidney disease with heart failure and stage 1 through stage 4 chronic kidney disease, or unspecified chronic kidney disease: Secondary | ICD-10-CM | POA: Insufficient documentation

## 2016-12-14 DIAGNOSIS — J42 Unspecified chronic bronchitis: Secondary | ICD-10-CM

## 2016-12-14 DIAGNOSIS — Z7982 Long term (current) use of aspirin: Secondary | ICD-10-CM | POA: Diagnosis not present

## 2016-12-14 DIAGNOSIS — Z8042 Family history of malignant neoplasm of prostate: Secondary | ICD-10-CM | POA: Insufficient documentation

## 2016-12-14 DIAGNOSIS — F209 Schizophrenia, unspecified: Secondary | ICD-10-CM | POA: Insufficient documentation

## 2016-12-14 DIAGNOSIS — Z818 Family history of other mental and behavioral disorders: Secondary | ICD-10-CM | POA: Insufficient documentation

## 2016-12-14 DIAGNOSIS — Z7951 Long term (current) use of inhaled steroids: Secondary | ICD-10-CM | POA: Insufficient documentation

## 2016-12-14 DIAGNOSIS — E1122 Type 2 diabetes mellitus with diabetic chronic kidney disease: Secondary | ICD-10-CM | POA: Insufficient documentation

## 2016-12-14 DIAGNOSIS — M1990 Unspecified osteoarthritis, unspecified site: Secondary | ICD-10-CM | POA: Insufficient documentation

## 2016-12-14 DIAGNOSIS — N183 Chronic kidney disease, stage 3 (moderate): Secondary | ICD-10-CM | POA: Diagnosis not present

## 2016-12-14 DIAGNOSIS — Z8674 Personal history of sudden cardiac arrest: Secondary | ICD-10-CM | POA: Diagnosis not present

## 2016-12-14 DIAGNOSIS — Z882 Allergy status to sulfonamides status: Secondary | ICD-10-CM | POA: Insufficient documentation

## 2016-12-14 DIAGNOSIS — Z79899 Other long term (current) drug therapy: Secondary | ICD-10-CM | POA: Insufficient documentation

## 2016-12-14 DIAGNOSIS — I6523 Occlusion and stenosis of bilateral carotid arteries: Secondary | ICD-10-CM | POA: Diagnosis not present

## 2016-12-14 DIAGNOSIS — Z6841 Body Mass Index (BMI) 40.0 and over, adult: Secondary | ICD-10-CM | POA: Diagnosis not present

## 2016-12-14 DIAGNOSIS — I10 Essential (primary) hypertension: Secondary | ICD-10-CM

## 2016-12-14 DIAGNOSIS — I252 Old myocardial infarction: Secondary | ICD-10-CM | POA: Diagnosis not present

## 2016-12-14 DIAGNOSIS — I5032 Chronic diastolic (congestive) heart failure: Secondary | ICD-10-CM | POA: Insufficient documentation

## 2016-12-14 DIAGNOSIS — K219 Gastro-esophageal reflux disease without esophagitis: Secondary | ICD-10-CM | POA: Insufficient documentation

## 2016-12-14 DIAGNOSIS — J439 Emphysema, unspecified: Secondary | ICD-10-CM | POA: Diagnosis not present

## 2016-12-14 DIAGNOSIS — I5022 Chronic systolic (congestive) heart failure: Secondary | ICD-10-CM

## 2016-12-14 LAB — BASIC METABOLIC PANEL
Anion gap: 13 (ref 5–15)
BUN: 20 mg/dL (ref 6–20)
CO2: 32 mmol/L (ref 22–32)
Calcium: 9.2 mg/dL (ref 8.9–10.3)
Chloride: 95 mmol/L — ABNORMAL LOW (ref 101–111)
Creatinine, Ser: 1.81 mg/dL — ABNORMAL HIGH (ref 0.44–1.00)
GFR calc Af Amer: 35 mL/min — ABNORMAL LOW (ref >60)
GFR calc non Af Amer: 30 mL/min — ABNORMAL LOW (ref >60)
Glucose, Bld: 85 mg/dL (ref 65–99)
Potassium: 2.8 mmol/L — ABNORMAL LOW (ref 3.5–5.1)
Sodium: 140 mmol/L (ref 135–145)

## 2016-12-14 MED ORDER — POTASSIUM CHLORIDE ER 10 MEQ PO TBCR: 60.0000 meq | EXTENDED_RELEASE_TABLET | Freq: Two times a day (BID) | ORAL | 6 refills | Status: DC

## 2016-12-14 NOTE — Patient Instructions (Signed)
Routine lab work today. Will notify you of abnormal results, otherwise no news is good news!  No changes to medication at this time.  Follow up 6-8 weeks with Oda Kilts PA-C.  ________________________________________________________________ Tiffany [name] Code: 9000  Take all medication as prescribed the day of your appointment. Bring all medications with you to your appointment.  Do the following things EVERYDAY: 1) Weigh yourself in the morning before breakfast. Write it down and keep it in a log. 2) Take your medicines as prescribed 3) Eat low salt foods-Limit salt (sodium) to 2000 mg per day.  4) Stay as active as you can everyday 5) Limit all fluids for the day to less than 2 liters

## 2016-12-14 NOTE — Progress Notes (Signed)
I spoke with Ms. Rahmani during her HF Clinic visit today regarding her HF and HF recommendations for home.  She tells me that she lives with her son and his "friend".  She drives herself to and from appointments.  She denies any issues with getting or taking prescribed medications.  She tells me that she weighs each day-however cannot say when she would call the physican.  I reviewed the importance of daily weights and when to contact physician related to weights/ worsening signs and symptoms of HF.  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  We discussed a low sodium diet and high sodium foods to avoid.  I strongly urged that she avoid these types of fast-food high sodium foods.  Of note she has 5 admits/ 6 mo and 2 ED visits in 6 mo.  She would benefit from HF [employer] referral secondary to high risk for readmission.  The patient is in agreement and I will place this referral

## 2016-12-14 NOTE — Progress Notes (Signed)
Advanced Heart Failure Clinic Note   Primary Care: Asiyah Cletis Media, MD         Primary Cardiologist: [name]  HPI:  Tiffany [name] is a 56 y.o. female with a hx of asthma, COPD on chronic supplemental O2, chronic diastolic CHF, prior MI, HTN, hyperlipidemia, type 2 diabetes, history of cocaine abuse, CKD stage II,anemiaand anxiety.   Last seen in [facility] office 11/09/16. Visit notable for marked dietary non-compliance and fluid retention.   Seen by PCP 12/01/16 and Metolazone cut back with AKI.   Pt presents today to establish in the HF clinic and for HF education. [name] feeling OK currently.  She lives at home with her son and his "friend". [name] not very active. Gets around in a WC. Denies SOB changing clothes, bathing, or getting around the house, but otherwise not very active.  She says all she has eaten today is two taco bell supreme tacos. Last night for dinner she had a taco bell burrito and taco (1500 mg Na combined). She states often she will only eat one meal a day. Denies snacking, and says she keeps her fluid < 2L. She denies lightheadedness or dizziness. Taking metolazone once a week. Feels like her fluid is OK.   Echo 10/30/16 with LVEF 60-65%, mild LAE      Review of systems complete and found to be negative unless listed in HPI.    Past Medical History:  Diagnosis Date  . Anxiety   . Arthritis    "all over" (04/10/2016)  . Asthma   . Cardiac arrest ([place]) 09/08/2016   PEA  . Carotid artery stenosis    1-39% bilateral by dopplers 11/2016  . Chronic bronchitis ([place])   . Chronic diastolic (congestive) heart failure ([place])   . Chronic kidney disease    "I see a kidney dr." (04/10/2016)  . Cocaine abuse ([place])   . Complication of anesthesia    decreased bp, decreased heart rate  . Depression   . Disorder of nervous system   . Emphysema   . GERD (gastroesophageal reflux disease)   . Heart attack ([place]) 1980s  . History of blood transfusion 1994   "couldn't stop  bleeding from my period"  . Hyperlipidemia LDL goal <70   . Hypertension   . Incontinence   . Manic depression ([place])   . On home oxygen therapy    "6L; 24/7" (04/10/2016)  . OSA on CPAP    "wear mask sometimes" (04/10/2016)  . Paranoid ([place])    "sometimes; I'm on RX for it" (04/10/2016)  . Pneumonia    "I've had it several times; haven't had it since 06/2015" (04/10/2016)  . Schizophrenia ([place])   . Seasonal allergies   . Seizures ([place])    "don't know what kind; last one was ~ 1 yr ago" (04/10/2016)  . Sinus trouble   . Stroke [facility]) 1980s   denies residual on 04/10/2016  . Type II diabetes mellitus ([place])     Current Outpatient Medications  Medication Sig Dispense Refill  . albuterol (PROVENTIL HFA;VENTOLIN HFA) 108 (90 Base) MCG/ACT inhaler Inhale 2 puffs every 6 (six) hours as needed into the lungs for wheezing or shortness of breath. 1 Inhaler 0  . albuterol (PROVENTIL) (2.5 MG/3ML) 0.083% nebulizer solution Take 3 mLs (2.5 mg total) every 6 (six) hours as needed by nebulization for wheezing or shortness of breath. 150 mL 1  . amLODipine (NORVASC) 5 MG tablet Take 1 tablet (5 mg total)  by mouth daily. 90 tablet 0  . ARIPiprazole (ABILIFY) 5 MG tablet Take1 tab adily 30 tablet 1  . aspirin 81 MG EC tablet Take 1 tablet (81 mg total) by mouth daily. Swallow whole. 90 tablet 0  . atorvastatin (LIPITOR) 80 MG tablet Take 1 tablet (80 mg total) by mouth daily. 60 tablet 3  . BREO ELLIPTA 100-25 MCG/INH AEPB INHALE 1 PUFF EVERY DAY 60 each 2  . escitalopram (LEXAPRO) 20 MG tablet Take 1 tablet (20 mg total) by mouth daily. 30 tablet 1  . flunisolide (NASALIDE) 25 MCG/ACT (0.025%) SOLN Place 2 sprays daily into the nose. 1 Bottle 0  . gabapentin (NEURONTIN) 600 MG tablet Take 1 tablet (600 mg total) by mouth 3 (three) times daily. 270 tablet 0  . guaiFENesin (MUCINEX) 600 MG 12 hr tablet Take 1 tablet (600 mg total) by mouth 2 (two) times daily. 30 tablet 0  . metFORMIN (GLUCOPHAGE-XR) 500 MG  24 hr tablet Take 1 tablet (500 mg total) by mouth 2 (two) times daily. 180 tablet 0  . mirtazapine (REMERON SOL-TAB) 45 MG disintegrating tablet Take 1 tablet (45 mg total) by mouth at bedtime. 30 tablet 1  . montelukast (SINGULAIR) 10 MG tablet Take 1 tablet (10 mg total) by mouth at bedtime. 90 tablet 1  . Multiple Vitamin (MULTIVITAMIN WITH MINERALS) TABS tablet Take 1 tablet by mouth daily.    [name] omeprazole (PRILOSEC) 40 MG capsule Take 1 capsule (40 mg total) by mouth 2 (two) times daily. in the morning and evebing 56 capsule 2  . OXYGEN Inhale 6 L into the lungs daily.    . potassium chloride (K-DUR) 10 MEQ tablet Take 6 tablets (60 mEq total) by mouth daily. 60 tablet 1  . tiotropium (SPIRIVA HANDIHALER) 18 MCG inhalation capsule Place 1 capsule (18 mcg total) into inhaler and inhale daily. 90 capsule 1  . torsemide (DEMADEX) 20 MG tablet Take 4 tablets (80 mg total) by mouth 2 (two) times daily. 720 tablet 3  . Trolamine Salicylate 10 % LOTN Apply 1 patch topically 3 (three) times daily as needed. 141 g 2  . metolazone (ZAROXOLYN) 2.5 MG tablet Take 1 tablet 2 days per week (Tuesdays and Fridays). Take 30 minutes before taking Torsemide 24 tablet 3   No current facility-administered medications for this encounter.    Allergies  Allergen Reactions  . Hydrocodone-Acetaminophen Shortness Of Breath  . Hydroxyzine Anaphylaxis and Shortness Of Breath  . Latuda [Lurasidone Hcl] Anaphylaxis  . Codeine Nausea And Vomiting  . Sulfa Antibiotics Itching   Social History   Socioeconomic History  . Marital status: Widowed    Spouse name: Not on file  . Number of children: 3  . Years of education: Not on file  . Highest education level: Not on file  Social Needs  . Financial resource strain: Not on file  . Food insecurity - worry: Not on file  . Food insecurity - inability: Not on file  . Transportation needs - medical: Not on file  . Transportation needs - non-medical: Not on file    Occupational History  . Occupation: disabled    Comment: factory production  Tobacco Use  . Smoking status: Former Smoker    Packs/day: 1.50    Years: 38.00    Pack years: 57.00    Types: Cigarettes    Start date: 03/13/1977    Last attempt to quit: 04/10/2016    Years since quitting: 0.6  . Smokeless tobacco: Never Used  Substance and Sexual Activity  . Alcohol use: No    Alcohol/week: 0.0 oz  . Drug use: No    Comment: 04/10/2016 "last used cocaine back in November 2017"  . Sexual activity: No    Birth control/protection: Surgical  Other Topics Concern  . Not on file  Social History Narrative   Has 1 son, [name]   Lives with son and his boyfriend   Her house has ramps and handrails should she ever needs them.    Her mother lives down the street from her and is a good support person in addition to her son.   She drives herself, has private transportation.    Cocaine free since 02/24/16, smoke free since 04/10/16   Family History  Problem Relation Age of Onset  . Cancer Father        prostate  . Cancer Mother        lung  . Depression Mother   . Depression Sister   . Anxiety disorder Sister   . Schizophrenia Sister   . Bipolar disorder Sister   . Depression Sister   . Depression Brother   . Heart failure Unknown        cousin   Vitals:   12/14/16 1446  BP: 112/68  Pulse: 82  SpO2: 97%  Weight: 294 lb 12.8 oz (133.7 kg)   Wt Readings from Last 3 Encounters:  12/14/16 294 lb 12.8 oz (133.7 kg)  12/05/16 292 lb 6.4 oz (132.6 kg)  12/01/16 290 lb (131.5 kg)    PHYSICAL EXAM: General:  Obese. NAD. Arrived in [facility]. O2 via [place] HEENT: normal Neck: supple. JVP difficult to assess with body habitus, but does not appear elevated. JVD. Carotids 2+ bilat; no bruits. No lymphadenopathy or thyromegaly appreciated. Cor: PMI nondisplaced. Regular rate & rhythm. No rubs, gallops or murmurs. Lungs: Distant Abdomen: soft, nontender, nondistended. No hepatosplenomegaly. No bruits  or masses. Good bowel sounds. Extremities: no cyanosis, clubbing, rash, edema Neuro: alert & oriented x 3, cranial nerves grossly intact. moves all 4 extremities w/o difficulty. Affect pleasant.  ECG:  ASSESSMENT & PLAN:  1. Chronic diastolic CHF - Echo 06/3014 LVEf 60-65% - Continue torsemide 80 mg BID - Continue metoalazone 2.5 mg once weekly.  - Reinforced fluid restriction to < 2 L daily, sodium restriction to less than 2000 mg daily, and the importance of daily weights.    2. HTN - Meds as above.  - Continue amlodipine 5 mg daily.  3. COPD on chronic home O2 - Stable. No wheeze on exam. Continue chronic O2.   4. Morbid obesity - Stressed importance of weight loss, including increasing activity and portion control.   5. CKD stage III - BMET today with recent hypokalemia - She establishes with Renal in January. (Had an appointment to day, but cancelled it in favor of ours)  6. DM2 - Per PCP  7. R carotid bruit - 1-39% bilateral by dopplers 11/2016.  - Per [name]  Pts volume status is stable, but her insight into her disease and understanding of appropriate diet is very low. Will have HF Navigator to see and consider Paramedicine for ongoing education and management.  Will follow up in 6-8 weeks. Labs today.   Shirley Friar, PA-C 12/14/16   Greater than 50% of the 45 minute visit was spent in counseling/coordination of care regarding disease state education, salt/fluid restriction, sliding scale diuretics, and medication compliance.

## 2016-12-14 NOTE — Telephone Encounter (Signed)
Per Oda Kilts PA-C VO, advised patient to double potassium to 60 meq BID for serum K 2.8 from todays OV. Patient aware and agreeable, Rx updated to preferred pharmacy electronically and in chart. Lab recheck scheduled for Monday.  Renee Pain, RN

## 2016-12-14 NOTE — Progress Notes (Signed)
Advanced Heart Failure Medication Review by a Pharmacist  Does the patient  feel that his/her medications are working for him/her?  yes  Has the patient been experiencing any side effects to the medications prescribed?  no  Does the patient measure his/her own blood pressure or blood glucose at home?  Not asked at this visit    Does the patient have any problems obtaining medications due to transportation or finances?   no  Understanding of regimen: excellent Understanding of indications: excellent Potential of compliance: excellent Patient understands to avoid NSAIDs. Patient understands to avoid decongestants.  Issues to address at subsequent visits: None   Pharmacist comments: Tiffany [name] is a 56 year old female presenting to clinic today without a med list or bottles. [name] very knowledge about her medications. She reports taking her morning doses today and adherence to her current regimen. She had no medication related questions or concerns at this time.   Time with patient: 10 Preparation and documentation time: 2 Total time: 12

## 2016-12-15 ENCOUNTER — Encounter: Payer: Self-pay | Admitting: Internal Medicine

## 2016-12-15 ENCOUNTER — Other Ambulatory Visit: Payer: Self-pay

## 2016-12-15 ENCOUNTER — Ambulatory Visit (INDEPENDENT_AMBULATORY_CARE_PROVIDER_SITE_OTHER): Payer: Medicare HMO | Admitting: Internal Medicine

## 2016-12-15 ENCOUNTER — Telehealth: Payer: Self-pay | Admitting: *Deleted

## 2016-12-15 VITALS — BP 112/78 | HR 74 | Temp 98.3°F | Ht 64.0 in | Wt 293.2 lb

## 2016-12-15 DIAGNOSIS — I5032 Chronic diastolic (congestive) heart failure: Secondary | ICD-10-CM | POA: Diagnosis not present

## 2016-12-15 DIAGNOSIS — M19049 Primary osteoarthritis, unspecified hand: Secondary | ICD-10-CM | POA: Diagnosis not present

## 2016-12-15 DIAGNOSIS — N183 Chronic kidney disease, stage 3 unspecified: Secondary | ICD-10-CM | POA: Insufficient documentation

## 2016-12-15 DIAGNOSIS — N1832 Chronic kidney disease, stage 3b: Secondary | ICD-10-CM | POA: Insufficient documentation

## 2016-12-15 DIAGNOSIS — N184 Chronic kidney disease, stage 4 (severe): Secondary | ICD-10-CM | POA: Insufficient documentation

## 2016-12-15 HISTORY — DX: Chronic kidney disease, stage 3 unspecified: N18.30

## 2016-12-15 MED ORDER — DICLOFENAC SODIUM 3 % TD GEL: TRANSDERMAL | 0 refills | Status: DC

## 2016-12-15 MED ORDER — BACLOFEN 10 MG PO TABS: 5.0000 mg | ORAL_TABLET | Freq: Three times a day (TID) | ORAL | 0 refills | Status: DC | PRN

## 2016-12-15 NOTE — Telephone Encounter (Signed)
Tiffany [name] Key: Raynelle Highland - PA Case ID: [id number] - Rx #: [id number] (Diclofenac 3% gel) sent to [facility]. Wll recheck in 72 hours. Hubbard Hartshorn, RN, BSN

## 2016-12-15 NOTE — Patient Instructions (Addendum)
It was nice seeing you today.  You can follow-up with me in 1 week.  I am going to try you on diclofenac gel and may take some time as this needs to be prior authorized.  I am also going to give you a muscle relaxer called baclofen and start you off at half a pill you can increase to a whole pill if you are not to fatigue by the pills.  You can take this up to 3 times a day.

## 2016-12-15 NOTE — Progress Notes (Signed)
   [name] [name], MD Phone: [phone]  Reason For Visit: Follow-up  # Osteoarthitis  - Patint is having discomfort in her hips, shoulders, foot, hands - Stays in those places - has it all over the body - Patient denies any swelling, or erthymea  - No trauma that paticularly started this off -Patient feels like she has pain everywhere  - She states this every day - Patient has been going on for months.  - Patient use to be well controlled on the Naproxen  - Patient is planning to start with physical therapy on December 20, 2016  - Patient has been using the lotion- does not help that much - can not apply to all the areas she has pain   History of cancer: None  Weak immune system:  Yes  History of IV drug use: No History of steroid use: No   Symptoms Incontinence of bowel or bladder: Baseline bladder incontinence - for years Fever: None  Rest or Night pain: None  Weight Loss:  None   #Congestive heart failure -Patient was seen by cardiologist yesterday.  Planning to repeat her labs on Monday -Regiment is to continue torsemide 80 mg twice daily, metolazone once a week -It bumped up her potassium to 120 mg of K-Dur daily  - Following up with her in 1 month   Past Medical History Reviewed problem list.  Medications- reviewed and updated No additions to family history Social history- patient is a non-smoker  Objective: BP 112/78   Pulse 74   Temp 98.3 F (36.8 C) (Oral)   Ht 5\' 4"  (1.626 m)   Wt 293 lb 3.2 oz (133 kg)   SpO2 93%   BMI 50.33 kg/m  Gen: NAD, alert, cooperative with exam Cardio: regular rate and rhythm, S1S2 heard, no murmurs appreciated Pulm: clear to auscultation bilaterally, no wheezes, rhonchi or rales GI: soft, non-tender, non-distended, bowel sounds present,  Extremities: warm, well perfused, No edema MSK:  Shoulder: bilateral shoulder tenderness, decreased range of motion on both shoulders consistent with  osteoarthritis, normal strength bilaterally, 2+ radial pulses Hip decreased range of motion in bilateral hips, pain along the trochanter, pain with external rotation of hip joint bilaterally worse in the right than the left, strength 5 out of 5 he Skin: dry, intact, no rashes or lesions  Assessment/Plan: See problem based a/p  Osteoarthritis Long-standing history of osteoarthritis pain in her joints not associated with any swelling or erythema.limited joint mobility on exam.  Patient states she was well controlled when she was taking naproxen, however we cannot continue this as she has significant CKD.  Patient has been using Aspercreme to help with the pain without much relief. -Discussed getting a heating pad, trying Voltaren gel, as well as a muscle relaxer-such as baclofen -Patient has a history of seizures and therefore cannot be on tramadol -Follow-up in 2 weeks to see how patient is doing -Could consider follow-up with orthopedics for steroid injections as another option in the future-patient has never had these and currently is not amenable to this idea  CKD (chronic kidney disease) Worsening kidney function in several months.  Possibly new baseline.  Will have patient follow-up with nephrology to establish care  Chronic diastolic congestive heart failure ([place]) Euvolemic on exam today Recently seen by cardiology K-Dur was increased from 60 mg to 120 daily Continue current regimen of torsemide 80 mg and metolazone once weekly per cardiology

## 2016-12-18 ENCOUNTER — Other Ambulatory Visit (HOSPITAL_COMMUNITY): Payer: Self-pay

## 2016-12-18 ENCOUNTER — Telehealth (HOSPITAL_COMMUNITY): Payer: Self-pay | Admitting: Surgery

## 2016-12-18 NOTE — Assessment & Plan Note (Signed)
Long-standing history of osteoarthritis pain in her joints not associated with any swelling or erythema.limited joint mobility on exam.  Patient states she was well controlled when she was taking naproxen, however we cannot continue this as she has significant CKD.  Patient has been using Aspercreme to help with the pain without much relief. -Discussed getting a heating pad, trying Voltaren gel, as well as a muscle relaxer-such as baclofen -Patient has a history of seizures and therefore cannot be on tramadol -Follow-up in 2 weeks to see how patient is doing -Could consider follow-up with orthopedics for steroid injections as another option in the future-patient has never had these and currently is not amenable to this idea

## 2016-12-18 NOTE — Telephone Encounter (Signed)
Patient called concerned regarding her Potassium dosage and the fact that she has a "sinus infection".  I tried to call patient and I am unable to reach her or leave a message.

## 2016-12-18 NOTE — Assessment & Plan Note (Signed)
>>  ASSESSMENT AND PLAN FOR STAGE 3B CHRONIC KIDNEY DISEASE ([place]) WRITTEN ON 12/18/2016  7:46 PM BY MIKELL, Jeani Sow, MD  Worsening kidney function in several months.  Possibly new baseline.  Will have patient follow-up with nephrology to establish care

## 2016-12-18 NOTE — Assessment & Plan Note (Signed)
Worsening kidney function in several months.  Possibly new baseline.  Will have patient follow-up with nephrology to establish care

## 2016-12-18 NOTE — Assessment & Plan Note (Signed)
Euvolemic on exam today Recently seen by cardiology K-Dur was increased from 60 mg to 120 daily Continue current regimen of torsemide 80 mg and metolazone once weekly per cardiology

## 2016-12-19 ENCOUNTER — Telehealth (HOSPITAL_COMMUNITY): Payer: Self-pay

## 2016-12-19 ENCOUNTER — Other Ambulatory Visit (HOSPITAL_COMMUNITY): Payer: Self-pay | Admitting: Surgery

## 2016-12-19 ENCOUNTER — Telehealth (HOSPITAL_COMMUNITY): Payer: Self-pay | Admitting: Surgery

## 2016-12-19 ENCOUNTER — Other Ambulatory Visit (HOSPITAL_COMMUNITY): Payer: Self-pay | Admitting: *Deleted

## 2016-12-19 ENCOUNTER — Other Ambulatory Visit: Payer: Self-pay | Admitting: Cardiology

## 2016-12-19 DIAGNOSIS — I5033 Acute on chronic diastolic (congestive) heart failure: Secondary | ICD-10-CM

## 2016-12-19 MED ORDER — POTASSIUM CHLORIDE ER 10 MEQ PO TBCR: 60.0000 meq | EXTENDED_RELEASE_TABLET | Freq: Two times a day (BID) | ORAL | 6 refills | Status: DC

## 2016-12-19 NOTE — Telephone Encounter (Signed)
I received another call from Ms. Steger regarding her Potassium.  I have called her back and she explains that she was told by the pharmacy ([facility] [address]) that she could not get the prescription filled due to the "high dose".  [facility] and they ensure me that they have the prescription ready and waiting on pick-up.  I also encouraged Ms. Ems to come in for a lab check of her potassium as she says she has not taken any potassium for several days and her last noted level was 2.8.  She tells me that she will get and take medication today.  I also encouraged her to call back Marylouise Stacks with the [facility] so that she can make a home visit.  Ms. Dusing has taken Katie's number and will call her back to schedule a visit after our phone conversation.

## 2016-12-19 NOTE — Telephone Encounter (Signed)
Joellen Jersey has emailed me to say that Ms. Matzen has not filled Potassium yet.  She asked me to call and check on cost of medication.  The pharmacy tech informs me that Potassium is free and is ready for pick-up.  The Torsemide will not be ready until 7pm and is no charge as well.  I have informed Joellen Jersey of this information.

## 2016-12-19 NOTE — Telephone Encounter (Signed)
Pt referred to paramedicine program, attempted to call pt but pts phone went on to VM and I left a message asking for a return call.

## 2016-12-20 ENCOUNTER — Encounter: Payer: Self-pay | Admitting: Physical Therapy

## 2016-12-20 ENCOUNTER — Ambulatory Visit: Payer: Medicare HMO | Attending: Family Medicine | Admitting: Physical Therapy

## 2016-12-20 ENCOUNTER — Other Ambulatory Visit (HOSPITAL_COMMUNITY): Payer: Self-pay

## 2016-12-20 ENCOUNTER — Other Ambulatory Visit: Payer: Self-pay | Admitting: Internal Medicine

## 2016-12-20 ENCOUNTER — Ambulatory Visit (HOSPITAL_BASED_OUTPATIENT_CLINIC_OR_DEPARTMENT_OTHER): Payer: Medicare HMO | Attending: Emergency Medicine | Admitting: Pulmonary Disease

## 2016-12-20 ENCOUNTER — Ambulatory Visit (HOSPITAL_COMMUNITY)
Admission: RE | Admit: 2016-12-20 | Discharge: 2016-12-20 | Disposition: A | Discharge status: Home / Self Care | Payer: Medicare HMO | Source: Ambulatory Visit | Attending: Internal Medicine | Admitting: Internal Medicine

## 2016-12-20 VITALS — HR 92

## 2016-12-20 VITALS — Ht 64.0 in | Wt 290.0 lb

## 2016-12-20 DIAGNOSIS — G4733 Obstructive sleep apnea (adult) (pediatric): Secondary | ICD-10-CM | POA: Insufficient documentation

## 2016-12-20 DIAGNOSIS — I110 Hypertensive heart disease with heart failure: Secondary | ICD-10-CM | POA: Diagnosis not present

## 2016-12-20 DIAGNOSIS — M25561 Pain in right knee: Secondary | ICD-10-CM | POA: Diagnosis present

## 2016-12-20 DIAGNOSIS — M25661 Stiffness of right knee, not elsewhere classified: Secondary | ICD-10-CM | POA: Diagnosis present

## 2016-12-20 DIAGNOSIS — M25571 Pain in right ankle and joints of right foot: Secondary | ICD-10-CM | POA: Diagnosis present

## 2016-12-20 DIAGNOSIS — I509 Heart failure, unspecified: Secondary | ICD-10-CM | POA: Diagnosis not present

## 2016-12-20 DIAGNOSIS — Z6841 Body Mass Index (BMI) 40.0 and over, adult: Secondary | ICD-10-CM | POA: Diagnosis not present

## 2016-12-20 DIAGNOSIS — G4736 Sleep related hypoventilation in conditions classified elsewhere: Secondary | ICD-10-CM | POA: Insufficient documentation

## 2016-12-20 DIAGNOSIS — E119 Type 2 diabetes mellitus without complications: Secondary | ICD-10-CM | POA: Diagnosis not present

## 2016-12-20 DIAGNOSIS — M25671 Stiffness of right ankle, not elsewhere classified: Secondary | ICD-10-CM

## 2016-12-20 DIAGNOSIS — J449 Chronic obstructive pulmonary disease, unspecified: Secondary | ICD-10-CM | POA: Diagnosis not present

## 2016-12-20 DIAGNOSIS — R269 Unspecified abnormalities of gait and mobility: Secondary | ICD-10-CM

## 2016-12-20 DIAGNOSIS — I5022 Chronic systolic (congestive) heart failure: Secondary | ICD-10-CM | POA: Insufficient documentation

## 2016-12-20 DIAGNOSIS — M6281 Muscle weakness (generalized): Secondary | ICD-10-CM | POA: Insufficient documentation

## 2016-12-20 DIAGNOSIS — G8929 Other chronic pain: Secondary | ICD-10-CM

## 2016-12-20 LAB — BASIC METABOLIC PANEL
Anion gap: 11 (ref 5–15)
BUN: 14 mg/dL (ref 6–20)
CO2: 30 mmol/L (ref 22–32)
Calcium: 9.3 mg/dL (ref 8.9–10.3)
Chloride: 102 mmol/L (ref 101–111)
Creatinine, Ser: 1.52 mg/dL — ABNORMAL HIGH (ref 0.44–1.00)
GFR calc Af Amer: 43 mL/min — ABNORMAL LOW (ref >60)
GFR calc non Af Amer: 37 mL/min — ABNORMAL LOW (ref >60)
Glucose, Bld: 126 mg/dL — ABNORMAL HIGH (ref 65–99)
Potassium: 3.5 mmol/L (ref 3.5–5.1)
Sodium: 143 mmol/L (ref 135–145)

## 2016-12-20 NOTE — Therapy (Signed)
[place], [place], [address] Phone: [phone]   Fax:  [phone]  Physical Therapy Evaluation  Patient Details  Name: Tiffany [name] MRN: [id number] Date of Birth: Jun 02, 1960 Referring Provider: Frankey Shown MD   Encounter Date: 12/20/2016  PT End of Session - 12/20/16 1440    Visit Number  1    Number of Visits  17    Date for PT Re-Evaluation  02/14/17    Authorization Type  MCR: Kx mod by 15th visit, progress note by 10th visit    PT Start Time  1501    PT Stop Time  1548    PT Time Calculation (min)  47 min    Activity Tolerance  Patient tolerated treatment well    Behavior During Therapy  [facility] for tasks assessed/performed       Past Medical History:  Diagnosis Date  . Anxiety   . Arthritis    "all over" (04/10/2016)  . Asthma   . Cardiac arrest ([place]) 09/08/2016   PEA  . Carotid artery stenosis    1-39% bilateral by dopplers 11/2016  . Chronic bronchitis ([place])   . Chronic diastolic (congestive) heart failure ([place])   . Chronic kidney disease    "I see a kidney dr." (04/10/2016)  . Cocaine abuse ([place])   . Complication of anesthesia    decreased bp, decreased heart rate  . Depression   . Disorder of nervous system   . Emphysema   . GERD (gastroesophageal reflux disease)   . Heart attack ([place]) 1980s  . History of blood transfusion 1994   "couldn't stop bleeding from my period"  . Hyperlipidemia LDL goal <70   . Hypertension   . Incontinence   . Manic depression ([place])   . On home oxygen therapy    "6L; 24/7" (04/10/2016)  . OSA on CPAP    "wear mask sometimes" (04/10/2016)  . Paranoid ([place])    "sometimes; I'm on RX for it" (04/10/2016)  . Pneumonia    "I've had it several times; haven't had it since 06/2015" (04/10/2016)  . Schizophrenia ([place])   . Seasonal allergies   . Seizures ([place])    "don't know what kind; last one was ~ 1 yr ago" (04/10/2016)  . Sinus trouble   . Stroke [facility])  1980s   denies residual on 04/10/2016  . Type II diabetes mellitus ([place])     Past Surgical History:  Procedure Laterality Date  . CESAREAN SECTION  1997  . HERNIA REPAIR    . IR CHOLANGIOGRAM EXISTING TUBE  07/20/2016  . IR PERC CHOLECYSTOSTOMY  05/10/2016  . IR RADIOLOGIST EVAL & MGMT  06/08/2016  . IR RADIOLOGIST EVAL & MGMT  06/29/2016  . IR SINUS/FIST TUBE CHK-NON GI  07/12/2016  . TIBIA IM NAIL INSERTION Right 07/12/2016   Procedure: INTRAMEDULLARY (IM) NAIL RIGHT TIBIA;  Surgeon: Leandrew Koyanagi, MD;  Location: [facility];  Service: Orthopedics;  Laterality: Right;  . UMBILICAL HERNIA REPAIR  ~ 1963   "that's why I don't have a belly button"  . VAGINAL HYSTERECTOMY      Vitals:   12/20/16 1445  Pulse: 92  SpO2: 95%     Subjective Assessment - 12/20/16 1445    Subjective  pt is a 56 y.o F with R knee and ankle pain that started due to a fall and had an IM nail placedin the tibia on 07/12/2016. She reports pain stays about the same since  the surgery but can fluctuate depending on activity. pt reported she had a fall the other day and tried avoiding hurting the RLE, stating that it felt like her RLE just gives way.  reports the ankle is worse than the knee.     Pertinent History  currently uses 6LPM o2 via nasal cannula    Limitations  Walking;Standing;House hold activities    How long can you sit comfortably?  5 min    How long can you stand comfortably?  5-10 min    How long can you walk comfortably?  5-10 min    Diagnostic tests  x-ray     Patient Stated Goals  unsure what to expect, to get the the leg feeling better, improve strengh     Currently in Pain?  Yes    Pain Score  8     Pain Location  Leg    Pain Orientation  Lower;Right    Pain Descriptors / Indicators  Throbbing;Dull;Aching    Pain Type  Chronic pain    Pain Onset  More than a month ago    Pain Frequency  Intermittent    Aggravating Factors   moving the ankle around, walking, bending the knee, sitting down    Pain  Relieving Factors  medicaid cream         [facility] PT Assessment - 12/20/16 1448      Assessment   Medical Diagnosis  Closed displaced transverse fracture of shaft of right tibia with routine healing,    Referring Provider  Frankey Shown MD    Onset Date/Surgical Date  -- June 2018 IM nailing of the tibia     Hand Dominance  Right    Next MD Visit  make one PRN  had been released from MD    Prior Therapy  yes HHPT      Precautions   Precaution Comments  currently uses O2 6 LPM,       Restrictions   Weight Bearing Restrictions  No      Balance Screen   Has the patient fallen in the past 6 months  Yes    How many times?  4    Has the patient had a decrease in activity level because of a fear of falling?   Yes    Is the patient reluctant to leave their home because of a fear of falling?   Yes      [place]  Private residence    Living Arrangements  Children    Available Help at Discharge  Family    Type of [place] - single point;Walker - 2 wheels    Additional Comments  O2 6LPM      Prior Function   Level of Independence  Independent with basic ADLs    Vocation  On disability    Leisure  riding in the car, movies, visiting people,       Observation/Other Assessments   Focus on Therapeutic Outcomes (FOTO)   99% limited predicted 79% limited      Posture/Postural Control   Posture/Postural Control  Postural limitations    Postural Limitations  Rounded Shoulders;Forward head      AROM   Right Knee Extension  -8    Right Knee Flexion  102    Left Knee Extension  -8  Left Knee Flexion  115    Right Ankle Dorsiflexion  -2 from nuetral    Right Ankle Plantar Flexion  50    Right Ankle Inversion  10    Right Ankle Eversion  12      PROM   Right/Left Ankle  Right    Right Ankle Dorsiflexion  0 from neutral    Right Ankle Plantar Flexion  55    Right Ankle  Inversion  13    Right Ankle Eversion  20      Strength   Right Knee Flexion  4-/5 pain during testing    Right Knee Extension  4-/5 pain during testing    Left Knee Flexion  4/5    Left Knee Extension  4/5    Right Ankle Dorsiflexion  4-/5    Right Ankle Plantar Flexion  4-/5    Right Ankle Inversion  3+/5    Right Ankle Eversion  3+/5      Palpation   Palpation comment  TTP along the lateral malleolous, and the talor mortis, aldong the distal/ and proximal tibia and fibula, The R patella, and meidal /lateral joine line and pateller tuberosity.      Ambulation/Gait   Ambulation/Gait  Yes    Assistive device  Straight cane    Gait Pattern  Step-to pattern;Decreased stride length;Trendelenburg;Antalgic;Trunk flexed;Decreased trunk rotation L foot toe out gait    Gait Comments  fatigues quickly             Objective measurements completed on examination: See above findings.              PT Education - 12/20/16 1440    Education provided  Yes    Education Details  evaluation findings, POC, goals, HEP with form.     Person(s) Educated  Patient    Methods  Explanation;Verbal cues    Comprehension  Verbalized understanding;Verbal cues required       PT Short Term Goals - 12/20/16 1556      PT SHORT TERM GOAL #1   Title  pt to be I with inital HEP    Time  4    Period  Weeks    Status  New    Target Date  01/17/17      PT SHORT TERM GOAL #2   Title  pt to demo techniques to reduce pain and inflammation via RICE and HEP in the R knee/ ankle    Time  4    Period  Weeks    Status  New    Target Date  01/17/17      PT SHORT TERM GOAL #3   Title  pt to be able to ambulate >/= 8 min with SPC with </= 7/10 pain for functional progression     Time  4    Period  Weeks    Status  New    Target Date  01/17/17        PT Long Term Goals - 12/20/16 1558      PT LONG TERM GOAL #1   Title  pt to increase R ankle DF / PF by >/= 6 degrees to promote ankle  mobility and gait efficiency     Time  8    Period  Weeks    Status  New    Target Date  02/14/17      PT LONG TERM GOAL #2   Title  pt to increase ankle/ knee strength to >/=  4/5 with </=3/10 pain to promote ankle/ knee stability with prolonged walking/ standing activities.     Time  8    Period  Weeks    Status  New    Target Date  02/14/17      PT LONG TERM GOAL #3   Title  pt to be able to walk for >/= 30 min with LRAD and pain </= 3/10 pain to promote functional endurance required for ADLs and community ambulation    Time  8    Period  Weeks    Status  New    Target Date  02/14/17      PT LONG TERM GOAL #4   Title  pt to increase FOTO score to >/= 79% limited to demo improvement of function     Time  8    Period  Weeks    Status  New    Target Date  02/14/17      PT LONG TERM GOAL #5   Title  pt to be I with all HEP given as of last visit     Time  8    Period  Weeks    Status  New    Target Date  02/14/17             Plan - 01-13-2017 1545    Clinical Impression Statement  pt is a 56 y.o F with CC of RLE pain s/p tibia IM surgery on 07/12/2016 due to displaced fracture secondary to a fall. She currently demonstrates limited R ankle and knee mobility with pain noted at end ranges of motion. associated weakness in both ankle and knee as expected following surgery. she currently utilized O2 at 6 LPM and uses a SPC with no weight bearing precautions. She would benefit from physical therapy to improve ankle/ knee mobility, increase strength, gait efficency and stability by addressing the deficits listed.     History and Personal Factors relevant to plan of care:  uses portable O2, significant PMhx including anxiety, arthritis, seizures, stroke    Clinical Presentation  Unstable    Clinical Presentation due to:  limited ankle ROm, limited knee ROM, abnormal gait, weakness    Clinical Decision Making  High    Rehab Potential  Good    PT Frequency  2x / week    PT  Duration  8 weeks    PT Treatment/Interventions  ADLs/Self Care Home Management;Cryotherapy;Electrical Stimulation;Iontophoresis 4mg /ml Dexamethasone;Moist Heat;Ultrasound;Balance training;Gait training;Stair training;Functional mobility training;Therapeutic exercise;Therapeutic activities;Manual techniques;Taping;Neuromuscular re-education;Patient/family education    PT Next Visit Plan  review/ update HEP, ankle /knee mobs, knee/ ankle strengthening/ stability, modalities PRN for pain    PT Home Exercise Plan  ankle Abc's, LAQ, calf stretching, seated heel slide    Consulted and Agree with Plan of Care  Patient       Patient will benefit from skilled therapeutic intervention in order to improve the following deficits and impairments:  Abnormal gait, Obesity, Pain, Decreased strength, Decreased knowledge of use of DME, Postural dysfunction, Improper body mechanics, Difficulty walking, Decreased mobility, Decreased endurance, Decreased range of motion, Decreased safety awareness, Decreased balance, Decreased activity tolerance  Visit Diagnosis: Pain in right ankle and joints of right foot  Stiffness of right ankle, not elsewhere classified  Muscle weakness (generalized)  Stiffness of right knee, not elsewhere classified  Chronic pain of right knee  Abnormality of gait  G-Codes - January 13, 2017 1606    Functional Assessment Tool Used (Outpatient Only)  ROM, strength, FOTO/ clinical  judgement    Functional Limitation  Mobility: Walking and moving around    Mobility: Walking and Moving Around Current Status [phone])  At least 80 percent but less than 100 percent impaired, limited or restricted    Mobility: Walking and Moving Around Goal Status [phone])  At least 60 percent but less than 80 percent impaired, limited or restricted        Problem List Patient Active Problem List   Diagnosis Date Noted  . CKD (chronic kidney disease) 12/15/2016  . Carotid artery stenosis   . Right foot pain  11/15/2016  . Right carotid bruit 11/09/2016  . Hypokalemia   . Osteoarthritis 10/26/2016  . Dyspnea 10/07/2016  . Cardiac arrest ([place]) 09/08/2016  . History of open reduction and internal fixation (ORIF) procedure   . Closed displaced transverse fracture of shaft of right tibia   . Fall   . Overactive bladder 06/07/2016  . QT prolongation   . OSA and COPD overlap syndrome ([place])   . Generalized weakness   . Arthritis   . Bipolar I disorder ([place])   . Essential hypertension 03/22/2016  . Bipolar affective disorder, mixed, severe, with psychotic behavior ([place]) 11/28/2015  . Cocaine use disorder, severe, dependence ([place]) 11/28/2015  . Chronic diastolic congestive heart failure ([place])   . Chronic respiratory failure with hypoxia and hypercapnia ([place]) 06/22/2015  . Onychomycosis 01/21/2015  . Nasal congestion 12/17/2014  . Tobacco use disorder 07/22/2014  . COPD (chronic obstructive pulmonary disease) ([place]) 07/08/2014  . Knee pain, bilateral 01/22/2013  . Seizure ([place]) 01/04/2013  . Health care maintenance 11/25/2012  . History of kidney stones 06/18/2012  . Chronic pain syndrome 06/18/2012  . Allergic reaction 04/07/2012  . HSV infection 08/30/2011  . Dyslipidemia 04/24/2011  . Anemia 04/24/2011  . Diabetic neuropathy ([place]) 04/24/2011  . Obstructive sleep apnea 10/18/2010  . Asthma 10/18/2010  . Morbid obesity ([place]) 10/18/2010  . Type 2 diabetes mellitus ([place]) 10/18/2010   Starr Lake PT, DPT, LAT, ATC  12/20/16  4:11 PM      [facility] [address] [place], [place], [address] Phone: [phone]   Fax:  [phone]  Name: WYNDI NORTHRUP MRN: [id number] Date of Birth: [birth year]

## 2016-12-20 NOTE — Progress Notes (Signed)
Late entry-Pt needed her potassium picked up from pharmacy and was not able to get there herself so I went there to [facility] and picked her meds and delivered them to her. Will see her for a full visit on Thursday.

## 2016-12-21 ENCOUNTER — Encounter: Payer: Self-pay | Admitting: Internal Medicine

## 2016-12-21 ENCOUNTER — Ambulatory Visit (INDEPENDENT_AMBULATORY_CARE_PROVIDER_SITE_OTHER): Payer: Medicare HMO | Admitting: Internal Medicine

## 2016-12-21 ENCOUNTER — Other Ambulatory Visit (HOSPITAL_COMMUNITY): Payer: Self-pay | Admitting: Pharmacist

## 2016-12-21 ENCOUNTER — Other Ambulatory Visit (HOSPITAL_COMMUNITY): Payer: Self-pay

## 2016-12-21 VITALS — BP 140/84 | HR 84 | Temp 98.2°F | Ht 64.0 in | Wt 300.0 lb

## 2016-12-21 DIAGNOSIS — R0602 Shortness of breath: Secondary | ICD-10-CM | POA: Diagnosis not present

## 2016-12-21 DIAGNOSIS — G4733 Obstructive sleep apnea (adult) (pediatric): Secondary | ICD-10-CM | POA: Diagnosis not present

## 2016-12-21 DIAGNOSIS — J45909 Unspecified asthma, uncomplicated: Secondary | ICD-10-CM | POA: Diagnosis not present

## 2016-12-21 DIAGNOSIS — G894 Chronic pain syndrome: Secondary | ICD-10-CM

## 2016-12-21 DIAGNOSIS — I5032 Chronic diastolic (congestive) heart failure: Secondary | ICD-10-CM

## 2016-12-21 DIAGNOSIS — J189 Pneumonia, unspecified organism: Secondary | ICD-10-CM | POA: Diagnosis not present

## 2016-12-21 DIAGNOSIS — J440 Chronic obstructive pulmonary disease with acute lower respiratory infection: Secondary | ICD-10-CM | POA: Diagnosis not present

## 2016-12-21 DIAGNOSIS — M6281 Muscle weakness (generalized): Secondary | ICD-10-CM | POA: Diagnosis not present

## 2016-12-21 DIAGNOSIS — I509 Heart failure, unspecified: Secondary | ICD-10-CM | POA: Diagnosis not present

## 2016-12-21 DIAGNOSIS — R062 Wheezing: Secondary | ICD-10-CM | POA: Diagnosis not present

## 2016-12-21 DIAGNOSIS — J449 Chronic obstructive pulmonary disease, unspecified: Secondary | ICD-10-CM | POA: Diagnosis not present

## 2016-12-21 MED ORDER — AMITRIPTYLINE HCL 10 MG PO TABS: 10.0000 mg | ORAL_TABLET | Freq: Every day | ORAL | 0 refills | Status: DC

## 2016-12-21 NOTE — Progress Notes (Deleted)
   [name] [name], MD Phone: [phone]  Reason For Visit:   # *** -   Past Medical History Reviewed problem list.  Medications- reviewed and updated No additions to family history Social history- patient is a *** smoker  Objective: BP 140/84 (BP Location: Right Wrist, Patient Position: Sitting, Cuff Size: Normal)   Pulse 84   Temp 98.2 F (36.8 C) (Oral)   Ht 5\' 4"  (1.626 m)   Wt 300 lb (136.1 kg)   SpO2 96%   BMI 51.49 kg/m  Gen: NAD, alert, cooperative with exam HEENT: Normal    Neck: No masses palpated. No lymphadenopathy    Ears: Tympanic membranes intact, normal light reflex, no erythema, no bulging    Eyes: PERRLA, EOMI    Nose: nasal turbinates moist    Throat: moist mucus membranes, no erythema Cardio: regular rate and rhythm, S1S2 heard, no murmurs appreciated Pulm: clear to auscultation bilaterally, no wheezes, rhonchi or rales GI: soft, non-tender, non-distended, bowel sounds present, no hepatomegaly, no splenomegaly GU: external vaginal tissue ***, cervix ***, *** punctate lesions on cervix appreciated, *** discharge from cervical os, *** bleeding, *** cervical motion tenderness, *** abdominal/ adnexal masses Extremities: warm, well perfused, No edema, cyanosis or clubbing;  MSK: Normal gait and station Skin: dry, intact, no rashes or lesions Neuro: Strength and sensation grossly intact   Assessment/Plan: See problem based a/p  No problem-specific Assessment & Plan notes found for this encounter.

## 2016-12-21 NOTE — Progress Notes (Signed)
Paramedicine Encounter    Patient ID: Tiffany [name], female    DOB: 07/19/1960, 55 y.o.   MRN: [id number]   Patient Care Team: [name], MD as PCP - General (Family Medicine) Dr. Verlin Grills (Family Medicine) Melancon, [place] Ram, MD (Inactive) as Resident (Family Medicine) Kennith Center, RD as Dietitian (Family Medicine)  Patient Active Problem List   Diagnosis Date Noted  . CKD (chronic kidney disease) 12/15/2016  . Carotid artery stenosis   . Right foot pain 11/15/2016  . Right carotid bruit 11/09/2016  . Hypokalemia   . Osteoarthritis 10/26/2016  . Dyspnea 10/07/2016  . Cardiac arrest ([place]) 09/08/2016  . History of open reduction and internal fixation (ORIF) procedure   . Closed displaced transverse fracture of shaft of right tibia   . Fall   . Overactive bladder 06/07/2016  . QT prolongation   . OSA and COPD overlap syndrome ([place])   . Generalized weakness   . Arthritis   . Bipolar I disorder ([place])   . Essential hypertension 03/22/2016  . Bipolar affective disorder, mixed, severe, with psychotic behavior ([place]) 11/28/2015  . Cocaine use disorder, severe, dependence ([place]) 11/28/2015  . Chronic diastolic congestive heart failure ([place])   . Chronic respiratory failure with hypoxia and hypercapnia ([place]) 06/22/2015  . Onychomycosis 01/21/2015  . Nasal congestion 12/17/2014  . Tobacco use disorder 07/22/2014  . COPD (chronic obstructive pulmonary disease) ([place]) 07/08/2014  . Knee pain, bilateral 01/22/2013  . Seizure ([place]) 01/04/2013  . Health care maintenance 11/25/2012  . History of kidney stones 06/18/2012  . Chronic pain syndrome 06/18/2012  . Allergic reaction 04/07/2012  . HSV infection 08/30/2011  . Dyslipidemia 04/24/2011  . Anemia 04/24/2011  . Diabetic neuropathy ([place]) 04/24/2011  . Obstructive sleep apnea 10/18/2010  . Asthma 10/18/2010  . Morbid obesity ([place]) 10/18/2010  . Type 2 diabetes mellitus ([place]) 10/18/2010    Current Outpatient  Medications:  .  albuterol (PROVENTIL HFA;VENTOLIN HFA) 108 (90 Base) MCG/ACT inhaler, Inhale 2 puffs every 6 (six) hours as needed into the lungs for wheezing or shortness of breath., Disp: 18 Inhaler, Rfl: 0 .  albuterol (PROVENTIL) (2.5 MG/3ML) 0.083% nebulizer solution, Take 3 mLs (2.5 mg total) every 6 (six) hours as needed by nebulization for wheezing or shortness of breath., Disp: 150 mL, Rfl: 1 .  amLODipine (NORVASC) 5 MG tablet, Take 1 tablet (5 mg total) by mouth daily., Disp: 90 tablet, Rfl: 0 .  ARIPiprazole (ABILIFY) 5 MG tablet, Take1 tab adily, Disp: 30 tablet, Rfl: 1 .  aspirin 81 MG EC tablet, Take 1 tablet (81 mg total) by mouth daily. Swallow whole., Disp: 90 tablet, Rfl: 0 .  atorvastatin (LIPITOR) 80 MG tablet, Take 1 tablet (80 mg total) by mouth daily., Disp: 60 tablet, Rfl: 3 .  baclofen (LIORESAL) 10 MG tablet, Take 0.5 tablets (5 mg total) by mouth 3 (three) times daily as needed for muscle spasms., Disp: 30 each, Rfl: 0 .  BREO ELLIPTA 100-25 MCG/INH AEPB, INHALE 1 PUFF EVERY DAY, Disp: 60 each, Rfl: 2 .  Diclofenac Sodium 3 % GEL, Use on area of the pain, Disp: 100 g, Rfl: 0 .  escitalopram (LEXAPRO) 20 MG tablet, Take 1 tablet (20 mg total) by mouth daily., Disp: 30 tablet, Rfl: 1 .  flunisolide (NASALIDE) 25 MCG/ACT (0.025%) SOLN, Place 2 sprays daily into the nose., Disp: 1 Bottle, Rfl: 0 .  gabapentin (NEURONTIN) 600 MG tablet, Take 1 tablet (600 mg total) by mouth 3 (  three) times daily., Disp: 270 tablet, Rfl: 0 .  guaiFENesin (MUCINEX) 600 MG 12 hr tablet, Take 1 tablet (600 mg total) by mouth 2 (two) times daily., Disp: 30 tablet, Rfl: 0 .  metFORMIN (GLUCOPHAGE-XR) 500 MG 24 hr tablet, Take 1 tablet (500 mg total) by mouth 2 (two) times daily., Disp: 180 tablet, Rfl: 0 .  metolazone (ZAROXOLYN) 2.5 MG tablet, Take 2.5 mg by mouth once a week. , Disp: , Rfl:  .  mirtazapine (REMERON SOL-TAB) 45 MG disintegrating tablet, Take 1 tablet (45 mg total) by mouth at  bedtime., Disp: 30 tablet, Rfl: 1 .  montelukast (SINGULAIR) 10 MG tablet, Take 1 tablet (10 mg total) by mouth at bedtime., Disp: 90 tablet, Rfl: 1 .  Multiple Vitamin (MULTIVITAMIN WITH MINERALS) TABS tablet, Take 1 tablet by mouth daily., Disp: , Rfl:  .  omeprazole (PRILOSEC) 40 MG capsule, Take 1 capsule (40 mg total) by mouth 2 (two) times daily. in the morning and evebing, Disp: 56 capsule, Rfl: 2 .  OXYGEN, Inhale 6 L into the lungs daily., Disp: , Rfl:  .  potassium chloride (K-DUR) 10 MEQ tablet, Take 6 tablets (60 mEq total) by mouth 2 (two) times daily., Disp: 360 tablet, Rfl: 6 .  tiotropium (SPIRIVA HANDIHALER) 18 MCG inhalation capsule, Place 1 capsule (18 mcg total) into inhaler and inhale daily., Disp: 90 capsule, Rfl: 1 .  torsemide (DEMADEX) 20 MG tablet, Take 4 tablets (80 mg total) by mouth 2 (two) times daily., Disp: 720 tablet, Rfl: 3 .  Trolamine Salicylate 10 % LOTN, Apply 1 patch topically 3 (three) times daily as needed., Disp: 141 g, Rfl: 2 .  torsemide (DEMADEX) 20 MG tablet, TAKE 3 TABLETS (60 MG TOTAL) BY MOUTH 2 (TWO) TIMES DAILY. (Patient not taking: Reported on 12/21/2016), Disp: 180 tablet, Rfl: 3 Allergies  Allergen Reactions  . Hydrocodone-Acetaminophen Shortness Of Breath  . Hydroxyzine Anaphylaxis and Shortness Of Breath  . Latuda [Lurasidone Hcl] Anaphylaxis  . Codeine Nausea And Vomiting  . Sulfa Antibiotics Itching     Social History   Socioeconomic History  . Marital status: Widowed    Spouse name: Not on file  . Number of children: 3  . Years of education: Not on file  . Highest education level: Not on file  Social Needs  . Financial resource strain: Not on file  . Food insecurity - worry: Not on file  . Food insecurity - inability: Not on file  . Transportation needs - medical: Not on file  . Transportation needs - non-medical: Not on file  Occupational History  . Occupation: disabled    Comment: factory production  Tobacco Use  .  Smoking status: Former Smoker    Packs/day: 1.50    Years: 38.00    Pack years: 57.00    Types: Cigarettes    Start date: 03/13/1977    Last attempt to quit: 04/10/2016    Years since quitting: 0.6  . Smokeless tobacco: Never Used  Substance and Sexual Activity  . Alcohol use: No    Alcohol/week: 0.0 oz  . Drug use: No    Comment: 04/10/2016 "last used cocaine back in November 2017"  . Sexual activity: No    Birth control/protection: Surgical  Other Topics Concern  . Not on file  Social History Narrative   Has 1 son, [name]   Lives with son and his boyfriend   Her house has ramps and handrails should she ever needs them.  Her mother lives down the street from her and is a good support person in addition to her son.   She drives herself, has private transportation.    Cocaine free since 02/24/16, smoke free since 04/10/16    Physical Exam  SA[place] - 12/21/16 1300      Situation   Heart failure history  Exisiting    Comorbidities  Anemia;COPD;DM;HTN;Sleep Apnea    Readmitted within 30 days  No    Hospital admission within past 12 months  Yes      Assessment   Lives alone  No    Primary support person  -- sons   sons   Mode of transportation  personal car;scat;family/friends    Other services involved  None    Home equipement  Cane;Home O2;Scale;Wheelchair      [employer]   Weighs self daily  Yes    Scale provided  No    Records on weight chart  No      Resources   Has "Living better w/heart failure" book  Yes    Has HF Zone tool  No    Able to identify yellow zone signs/when to call MD  Yes    Records zone daily  No      Medications   Uses a pill box  Yes    Who stocks the pill box  self    Pill box checked this visit  No    Pill box refilled this visit  No    Difficulty obtaining medications  No    Mail order medications  Yes    New medications at home today  No    Missed one or more doses of medications per week  No      Nutrition   Patient receives meals on  wheels  No    Patient follows low sodium diet  No    Has foods at home that meet the current recommended diet  No    Patient follows low sugar/card diet  Yes    Nutritional concerns/issues  y      Activity Level   ADL's/Mobility  Independent      Urine   Difficulty urinating  No    Changes in urine  [place] - 12/21/16 1300      Outside of House   Sidewalk and pathway to house is level and free from any hazards  Yes    Driveway is free from debris/snow/ice  Yes    Outside stairs are stable and have sturdy handrail  Yes    Porch lights are working and provide adequate lighting  Yes      Living Room   Furniture is of adequate height and offers arm rests that assist in getting up and down  Yes    Floor is free from any clutter that would create tripping hazards  Yes    All cords are either behind furniture or secured in a manner that does not cause trip hazards  Yes    All rugs are secured to floor with double-sided tape  N/A    Lighting is adequate to light room  Yes    All lighting has an easily accessible on/off switch  Yes    Phone is readily accessible near favorite seating areas  Yes    Emergency numbers are printed near all phones in house  No      Kitchen   Items used most often  are within easy reach on low shelves  Yes    Step stool is present, is sturdy and has a handrail  N/A    Floor mats are non-slip tread and secured to floor  N/A    Oven controls are within easy reach  Yes    Kitchen lighting is adequate and easy to reach switches  Yes    ABC fire extinguisher is located in kitchen  Yes      [place] is properly secured to stairs and/or all wood is properly secured  N/A    Handrail is present and sturdy  N/A    Stairs are free from any clutter  N/A    Stairway is adequately lit  N/A      Bathroom   Tub and shower have a non-slip surface  Yes    Tub and/or shower have a grab bar for stability  Yes    Toilet  has a raised seat  No    Grab bar is attached near toilet for assistance  Yes    Pathway from bedroom to bathroom is free from clutter and well lit for ease of movement in the middle of the night  Yes      Bedroom   Floor is free from clutter  Yes    Light is near bed and is easy to turn on  Yes    Phone is next to bed and within easy reach  Yes    Flashlight is near bed in case of emergency  Yes      General   Smoke detectors in all areas of the house (each floor) and tested  No    CO detectors on each floor of house and tested  No    Flashlights are handy throughout the home  No    Resident has all medical information readily available and in an area emergency providers will easily find  No    All heaters are away from any type of flammable material  N/A      Overall Tips   Homeowner ha good non-skid shoes to move around house  Yes    All assisted walking devices are readily accessible and in good condition  Yes    There is a phone near the floor for ease of reach in case of a fall  YES    All O2 tubing is less than 50 ft. and is not a trip hazard  Yes    Resident has had an annual hearing and vision check by a physician  Yes    Resident has the proper hearing and visual aids prescribed and are in good working order  Yes    All medications are properly stored and labeled to avoid confusion on dosage, time to take, and avoidance of missed doses  Yes        Future Appointments  Date Time Provider [place]  12/21/2016  2:30 PM [name], MD [facility]  12/25/2016  1:30 PM Kennith Center, RD [facility]  12/28/2016 11:00 AM [name] Rower, PTA [facility]  12/28/2016  2:30 PM Carlus Pavlov R [facility] None  01/03/2017  3:00 PM [facility] SUB THERAPIST 4 [facility] [place]  01/04/2017  1:30 PM Lennox Pippins [facility]  01/04/2017  3:30 PM [name], MD [facility]  01/11/2017  2:30 PM Mindi Curling [facility] None  01/11/2017   3:45 PM [name] Rower, PTA [facility]  01/17/2017  2:15 PM Larey Days, PT [facility] [place]  01/18/2017  3:00 PM Larey Days, PT [facility]  01/19/2017  3:00 PM [name], PA-C [facility]  01/25/2017  3:00 PM [facility] PA/NP [facility] None  03/13/2017  2:15 PM Arfeen, Arlyce Harman, MD [facility] None   BP 132/90   Pulse 76   Resp 18   SpO2 98%  Weight yesterday-290 Last visit weight- [facility]-101   Pt reports that she is hurting all over due to her arthritis pains and she is going to doctor this afternoon for it.  Pt was not able to get me her meds to verify them due to her pain and didn't want me to go back in her room to get them. She was able to verify verbally all her meds to me as I called them out. She lives with her 2 sons at home. [name] requesting help as her kids arent much help so we will look into to see if she is eligible for home aide. She weighs daily but doesn't record it as she remembers it well.  [name] not on low sodium diet, eats out a LOT. She states since her kids got grown she no longer cooks and has been trying for a year to get back into cooking. She states she used to eat out every meal but she has cut back to eating out once a day. She report she does not check her CBG daily as the doc told her she didn't need to check it at home and she doesn't have the supplies to do so. [name] on 6L 02 continuously. She states her breathing is better than its normally been and she has ben out of hosp for 49mths now. I told her we really need to work on that diet and her eating out a lot. She does get sob upon going to bathroom. She goes to rehab for PT. She states she uses her CPAP at night when she is able to sleep, she doesn't sleep well at night. She reports she has a palliative care nurse that comes once a month. She states she uses [facility] pill packing but [facility] for quick [facility]. I went and got her potassium Tuesday for her as she was  out for a few days. She states the odor of her urine is very strong recently, no changes in frequency or color. [name] going to see her PCP for that.  Need to get fire dept to do smoke detector check.   ACTION: Home visit completed  Marylouise Stacks, EMT-Paramedic 12/21/16

## 2016-12-21 NOTE — Patient Instructions (Signed)
I want you to second take a second dose of metolazone tonight to help further diuresis.  Please call your cardiologist tomorrow to set up an appointment to be seen by them.  I am going to start you on a medication called amitriptyline to help with your pain will start that at a low dose and increase over the next couple of weeks.  I am going to start her on 10 mg of amitriptyline please take this at night

## 2016-12-21 NOTE — Progress Notes (Signed)
   [name] [name], MD Phone: [phone]  Reason For Visit: Follow-up  # SOB  - Patient feels like she caring to much fluid today  - She feels tight in her abdomen  - Patient has been taking Toresmide 80 mg X 2  - Took metalozone x on Tuesday  - Not drinking as much as she was before cause it makes her feel - She feels like her breathing is not doing as well  - States she worsening leg swelling  - Patient states she has been having wheezing today  - patient denies any cough or sputum production  - No chest pain, no diaphoresis, no nausea   # Osteoarithitis vs Fibromylogia  - Can not take NSAIDs, Steriods, or  Tramadol , - Insurance would not pay for diclonfenac gel   Past Medical History Reviewed problem list.  Medications- reviewed and updated No additions to family history Social history- patient is a non-smoker  Objective: BP 140/84 (BP Location: Right Wrist, Patient Position: Sitting, Cuff Size: Normal)   Pulse 84   Temp 98.2 F (36.8 C) (Oral)   Ht 5\' 4"  (1.626 m)   Wt 300 lb (136.1 kg)   SpO2 96%   BMI 51.49 kg/m  Gen: NAD, alert, cooperative with exam Cardio: regular rate and rhythm, S1S2 heard, no murmurs appreciated Pulm: clear to auscultation bilaterally, no wheezes, rhonchi or rales GI: soft, non-tender, non-distended, bowel sounds present, no hepatomegaly, no splenomegaly Extremities: 1+ edema in bilateral lower extremities  Assessment/Plan: See problem based a/p  Chronic diastolic congestive heart failure ([place]) Patient with increased weight today.  [name] 300 pounds, states she feels good when she is around to 295 pounds.  Today she feels tight in her stomach and short of breath.  Her vitals are stable.  She has no crackles on examination of her lungs. -Will increase her metolazone to a twice daily dose today.  Patient to take metolazone 2.5 mg when she gets home tonight -Continue torsemide 80 mg twice daily -Give me a  call tomorrow to see how she is doing -Please call cardiology to let them know how you are feeling -Follow-up early next week  Chronic pain syndrome Patient states she has chronic pain in all of her joints.  She states she was doing perfectly well when she was on her naproxen,  since she is no longer able to take naproxen she is now in a lot of pain. states Tylenol does not help at all.  I have also prescribed her a muscle relaxant in the past which she states did not help.  Patient has a history of seizures and therefore is not a candidate for tramadol.  Given her nonspecific pain all, I think she likely has fibromyalgia -I will try amitriptyline-slowly increase the dose over the next week -Would like to see orthopedics, I have asked her to hold off on this as I do not believe it can be that helpful and especially with her chronic joint pain.-She however would like to consider may be getting joint injections-though without a specific area that is bothering her I think this would not work

## 2016-12-22 ENCOUNTER — Telehealth: Payer: Self-pay | Admitting: Internal Medicine

## 2016-12-22 DIAGNOSIS — M19049 Primary osteoarthritis, unspecified hand: Secondary | ICD-10-CM

## 2016-12-22 NOTE — Assessment & Plan Note (Signed)
Patient with increased weight today.  [name] 300 pounds, states she feels good when she is around to 295 pounds.  Today she feels tight in her stomach and short of breath.  Her vitals are stable.  She has no crackles on examination of her lungs. -Will increase her metolazone to a twice daily dose today.  Patient to take metolazone 2.5 mg when she gets home tonight -Continue torsemide 80 mg twice daily -Give me a call tomorrow to see how she is doing -Please call cardiology to let them know how you are feeling -Follow-up early next week

## 2016-12-22 NOTE — Telephone Encounter (Signed)
Pt wants dr to call her back to talk about her pains and fluids. Please advise

## 2016-12-22 NOTE — Assessment & Plan Note (Signed)
Patient states she has chronic pain in all of her joints.  She states she was doing perfectly well when she was on her naproxen,  since she is no longer able to take naproxen she is now in a lot of pain. states Tylenol does not help at all.  I have also prescribed her a muscle relaxant in the past which she states did not help.  Patient has a history of seizures and therefore is not a candidate for tramadol.  Given her nonspecific pain all, I think she likely has fibromyalgia -I will try amitriptyline-slowly increase the dose over the next week -Would like to see orthopedics, I have asked her to hold off on this as I do not believe it can be that helpful and especially with her chronic joint pain.-She however would like to consider may be getting joint injections-though without a specific area that is bothering her I think this would not work

## 2016-12-22 NOTE — Procedures (Signed)
   Patient Name: Tiffany [name], [place] Date: 12/20/2016 Gender: Female D.O.B: 1960-03-27 Age (years): 56 Referring Provider: Baltazar Apo Height (inches): 107 Interpreting Physician: Chesley Mires MD, ABSM Weight (lbs): 290 RPSGT: Jorge Ny BMI: 34 MRN: [id number] Neck Size: 19.50  CLINICAL INFORMATION Sleep Study Type: NPSG  Indication for sleep study: CHF, COPD, Diabetes, Hypertension, Morbid Obesity, OSA, Snoring, Witnessed apnea (327.23)  Epworth Sleepiness Score: 1  SLEEP STUDY TECHNIQUE As per the AASM Manual for the Scoring of Sleep and Associated Events v2.3 (April 2016) with a hypopnea requiring 4% desaturations.  The channels recorded and monitored were frontal, central and occipital EEG, electrooculogram (EOG), submentalis EMG (chin), nasal and oral airflow, thoracic and abdominal wall motion, anterior tibialis EMG, snore microphone, electrocardiogram, and pulse oximetry.  MEDICATIONS Medications self-administered by patient taken the night of the study : N/A  SLEEP ARCHITECTURE The study was initiated at 10:42:50 PM and ended at 5:01:17 AM.  Sleep onset time was 0.0 minutes and the sleep efficiency was 92.5%. The total sleep time was 349.9 minutes.  Stage REM latency was 40.9 minutes.  The patient spent 0.97% of the night in stage N1 sleep, 78.02% in stage N2 sleep, 0.00% in stage N3 and 21.01% in REM.  Alpha intrusion was absent.  Supine sleep was 99.99%.  RESPIRATORY PARAMETERS The overall apnea/hypopnea index (AHI) was 5.5 per hour. There were 32 total apneas, including 32 obstructive, 0 central and 0 mixed apneas. There were 0 hypopneas and 7 RERAs.  The AHI during Stage REM sleep was 3.3 per hour.  AHI while supine was 5.5 per hour.  The mean oxygen saturation was 98.64%. The minimum SpO2 during sleep was 97.00%.  The study was conducted with the patient using 6 liters supplemental oxygen.  moderate snoring was noted during this  study.  CARDIAC DATA The 2 lead EKG demonstrated sinus rhythm. The mean heart rate was 71.07 beats per minute. Other EKG findings include: None.  LEG MOVEMENT DATA The total PLMS were 0 with a resulting PLMS index of 0.00. Associated arousal with leg movement index was 0.0 .  IMPRESSIONS - This study showed mild obstructive sleep apnea with an AHI of 5.5 mild obstructive sleep apnea. - The study was conducted with her using 6 liters supplemental oxygen.  Her low oxygen level was 97%.  DIAGNOSIS - Obstructive Sleep Apnea (327.23 [G47.33 ICD-10]) - Nocturnal Hypoxemia (327.26 [G47.36 ICD-10]  RECOMMENDATIONS - In review of her medical records, she is on therapy with a Trilogy home vent and she should continue on this with supplemental oxygen.  [Electronically signed] 12/22/2016 05:27 PM  Chesley Mires MD, [place], American Board of Sleep Medicine   NPI: [id number]

## 2016-12-22 NOTE — Telephone Encounter (Signed)
Patient's breathing is improved. Would like a referral to orthopedics.

## 2016-12-24 DIAGNOSIS — R0602 Shortness of breath: Secondary | ICD-10-CM | POA: Diagnosis not present

## 2016-12-24 DIAGNOSIS — R062 Wheezing: Secondary | ICD-10-CM | POA: Diagnosis not present

## 2016-12-24 DIAGNOSIS — J189 Pneumonia, unspecified organism: Secondary | ICD-10-CM | POA: Diagnosis not present

## 2016-12-24 DIAGNOSIS — G4733 Obstructive sleep apnea (adult) (pediatric): Secondary | ICD-10-CM | POA: Diagnosis not present

## 2016-12-24 DIAGNOSIS — J440 Chronic obstructive pulmonary disease with acute lower respiratory infection: Secondary | ICD-10-CM | POA: Diagnosis not present

## 2016-12-24 DIAGNOSIS — J45909 Unspecified asthma, uncomplicated: Secondary | ICD-10-CM | POA: Diagnosis not present

## 2016-12-25 ENCOUNTER — Ambulatory Visit: Payer: Self-pay | Admitting: Family Medicine

## 2016-12-26 ENCOUNTER — Other Ambulatory Visit (HOSPITAL_COMMUNITY): Payer: Self-pay | Admitting: Psychiatry

## 2016-12-26 DIAGNOSIS — F200 Paranoid schizophrenia: Secondary | ICD-10-CM

## 2016-12-26 NOTE — Telephone Encounter (Signed)
PA Case: [id number], Status: Denied. Will need specific, detailed clinical rationale to support request for redetermination along with supporting chart notes. Hubbard Hartshorn, RN, BSN

## 2016-12-27 ENCOUNTER — Other Ambulatory Visit (HOSPITAL_COMMUNITY): Payer: Self-pay | Admitting: Psychiatry

## 2016-12-27 DIAGNOSIS — F200 Paranoid schizophrenia: Secondary | ICD-10-CM

## 2016-12-28 ENCOUNTER — Telehealth: Payer: Self-pay | Admitting: Physical Therapy

## 2016-12-28 ENCOUNTER — Ambulatory Visit (HOSPITAL_COMMUNITY): Payer: Medicare HMO | Admitting: Licensed Clinical Social Worker

## 2016-12-28 ENCOUNTER — Ambulatory Visit: Payer: Medicare HMO | Admitting: Physical Therapy

## 2016-12-28 ENCOUNTER — Telehealth (HOSPITAL_COMMUNITY): Payer: Self-pay

## 2016-12-28 ENCOUNTER — Encounter (HOSPITAL_COMMUNITY): Payer: Self-pay | Admitting: Licensed Clinical Social Worker

## 2016-12-28 DIAGNOSIS — R0602 Shortness of breath: Secondary | ICD-10-CM | POA: Diagnosis not present

## 2016-12-28 NOTE — Telephone Encounter (Signed)
Message left on voice mail about missed visit.  Our number was given for her to call if she cannot attend next visit, or if she no longer needs PT.  I let her know when her next appointment was and that we were looking forward to treating her.  I referred her to the attendance policy  for missed visits.  Melvenia Needles PTA

## 2017-01-01 ENCOUNTER — Ambulatory Visit (INDEPENDENT_AMBULATORY_CARE_PROVIDER_SITE_OTHER): Payer: Medicare HMO | Admitting: Family Medicine

## 2017-01-01 ENCOUNTER — Other Ambulatory Visit: Payer: Self-pay | Admitting: Internal Medicine

## 2017-01-01 ENCOUNTER — Encounter: Payer: Self-pay | Admitting: Family Medicine

## 2017-01-01 DIAGNOSIS — J45909 Unspecified asthma, uncomplicated: Secondary | ICD-10-CM | POA: Diagnosis not present

## 2017-01-01 DIAGNOSIS — J189 Pneumonia, unspecified organism: Secondary | ICD-10-CM | POA: Diagnosis not present

## 2017-01-01 DIAGNOSIS — J440 Chronic obstructive pulmonary disease with acute lower respiratory infection: Secondary | ICD-10-CM | POA: Diagnosis not present

## 2017-01-01 DIAGNOSIS — G4733 Obstructive sleep apnea (adult) (pediatric): Secondary | ICD-10-CM | POA: Diagnosis not present

## 2017-01-01 DIAGNOSIS — R0981 Nasal congestion: Secondary | ICD-10-CM

## 2017-01-01 DIAGNOSIS — J449 Chronic obstructive pulmonary disease, unspecified: Secondary | ICD-10-CM | POA: Diagnosis not present

## 2017-01-01 DIAGNOSIS — R062 Wheezing: Secondary | ICD-10-CM | POA: Diagnosis not present

## 2017-01-01 DIAGNOSIS — E118 Type 2 diabetes mellitus with unspecified complications: Secondary | ICD-10-CM

## 2017-01-01 DIAGNOSIS — I509 Heart failure, unspecified: Secondary | ICD-10-CM | POA: Diagnosis not present

## 2017-01-01 DIAGNOSIS — M6281 Muscle weakness (generalized): Secondary | ICD-10-CM | POA: Diagnosis not present

## 2017-01-01 DIAGNOSIS — R0602 Shortness of breath: Secondary | ICD-10-CM | POA: Diagnosis not present

## 2017-01-01 MED ORDER — FLUNISOLIDE 25 MCG/ACT (0.025%) NA SOLN: 2.0000 | Freq: Every day | NASAL | 2 refills | Status: DC

## 2017-01-01 NOTE — Progress Notes (Signed)
Medical Nutrition Therapy:  Appt start time: 1430 end time:  1219. PCP: [name], MD Therapist: Francee Piccolo, [facility]   Assessment:  Primary concerns today: Weight management and Blood sugar control.  Ms. Pence is going to physical therapy rehab 2 X wk, and she is also doing 45 min of home exercises ~5 X wk.    Ms. Crew is not checking BG at home, but A1C was 5.5 last October, probably reflecting more her infrequent eating rather than nutritionally sound choices.  She usually consumes soda as well as cranberry juice daily.  Her one meal per day is often a fast food burger.  She adamantly states she does not cook (althoug does get home-cooked meals sometimes that her son's friend makes or her mother brings over).  Food insecurity is a barrier to better eating as well.  Ms. Macha has taken advantage of some of the food assistance programs about which I gave her information at last appt.    Usual eating pattern is one meal a day (dinner) and a couple of snacks daily (usually chips).  She said she is just not hungry.  Usually going to bed ~3:30 AM, up at 8:30 for med's, and back to bed ~9:30 to 1 PM.    24-hr recall:  (Up at 8:30 AM; took med's, showered, back to bed ~9:30 till 1 PM) B ( AM)-  --- Snk ( AM)-  --- L (1:30 PM)-  12 regular soda Snk ( PM)-  --- D (5:30 PM)-  3 oz steak, 1 c rice, 3 tbsp gravy, 1/2 c mixed greens, 12 oz soda Snk ( PM)-  --- Typical day? Yes.      Progress Towards Goal(s):  In progress.   Nutritional Diagnosis:  NB-1.1 Food and nutrition-related knowledge deficit As related to weight management and BG control.  As evidenced by patient acknowledgement.    Intervention:  Nutrition education.  Handouts given during visit include:  AVS  Demonstrated degree of understanding via:  Teach Back  Barriers to learning/adherence to lifestyle change: Knowledge deficit as well as disinterest in food preparation and food insecurity.    Monitoring/Evaluation:  Dietary intake, exercise, FBG, and body weight in 4 week(s).

## 2017-01-01 NOTE — Patient Instructions (Addendum)
Ask your son if he can come to your next nutrition appointment with you.   Regular soda is not good for your blood glucose or weight loss.   If you would like to keep losing weight, achieving these goals will help you:  Starchy (carb) foods: Bread, rice, pasta, potatoes, corn, cereal, grits, crackers, bagels, muffins, all baked goods.  Protein foods: Meat, fish, poultry, eggs, dairy foods, and beans such as pinto and kidney beans (beans also provide carbohydrate).   1. Get at least two real meals a day.  One can be just a sandwich with some vegetables on the side, like broccoli, okra, greens, cherry tomatoes.  A REAL meal includes a protein food, starchy food, and a vegetable.    - The more vegetables you eat, the more you will come to like vegetables, including those you aren't crazy about right now.  Humans tend to prefer the foods we get most used to eating!  (Try a new food at least 12 times before you decide you don't like it.)  2. Continue to go to physical therapy rehab twice a week, and to do your exercises at home on other days.    Your next Nutrition appt is on Monday, Jan 21 at 3:30 PM.

## 2017-01-02 ENCOUNTER — Other Ambulatory Visit (HOSPITAL_COMMUNITY): Payer: Self-pay | Admitting: Psychiatry

## 2017-01-02 DIAGNOSIS — F200 Paranoid schizophrenia: Secondary | ICD-10-CM

## 2017-01-03 ENCOUNTER — Ambulatory Visit: Payer: Medicare HMO | Admitting: Physical Therapy

## 2017-01-03 ENCOUNTER — Encounter: Payer: Self-pay | Admitting: Physical Therapy

## 2017-01-03 DIAGNOSIS — G8929 Other chronic pain: Secondary | ICD-10-CM | POA: Diagnosis not present

## 2017-01-03 DIAGNOSIS — M25571 Pain in right ankle and joints of right foot: Secondary | ICD-10-CM

## 2017-01-03 DIAGNOSIS — M6281 Muscle weakness (generalized): Secondary | ICD-10-CM

## 2017-01-03 DIAGNOSIS — M25661 Stiffness of right knee, not elsewhere classified: Secondary | ICD-10-CM

## 2017-01-03 DIAGNOSIS — R269 Unspecified abnormalities of gait and mobility: Secondary | ICD-10-CM

## 2017-01-03 DIAGNOSIS — M25561 Pain in right knee: Secondary | ICD-10-CM

## 2017-01-03 DIAGNOSIS — M25671 Stiffness of right ankle, not elsewhere classified: Secondary | ICD-10-CM

## 2017-01-03 NOTE — Therapy (Signed)
[facility] [place], [place], [address] Phone: [phone]   Fax:  [phone]  Physical Therapy Treatment  Patient Details  Name: Tiffany [name] MRN: [id number] Date of Birth: July 24, 1960 Referring Provider: Frankey Shown MD   Encounter Date: 01/03/2017  PT End of Session - 01/03/17 1544    Visit Number  2    Number of Visits  17    Date for PT Re-Evaluation  02/14/17    Authorization Type  MCR: Kx mod by 15th visit, progress note by 10th visit    PT Start Time  1500    PT Stop Time  1540    PT Time Calculation (min)  40 min    Activity Tolerance  Patient tolerated treatment well;Patient limited by pain    Behavior During Therapy  [facility] for tasks assessed/performed       Past Medical History:  Diagnosis Date  . Anxiety   . Arthritis    "all over" (04/10/2016)  . Asthma   . Cardiac arrest ([place]) 09/08/2016   PEA  . Carotid artery stenosis    1-39% bilateral by dopplers 11/2016  . Chronic bronchitis ([place])   . Chronic diastolic (congestive) heart failure ([place])   . Chronic kidney disease    "I see a kidney dr." (04/10/2016)  . Cocaine abuse ([place])   . Complication of anesthesia    decreased bp, decreased heart rate  . Depression   . Disorder of nervous system   . Emphysema   . GERD (gastroesophageal reflux disease)   . Heart attack ([place]) 1980s  . History of blood transfusion 1994   "couldn't stop bleeding from my period"  . Hyperlipidemia LDL goal <70   . Hypertension   . Incontinence   . Manic depression ([place])   . On home oxygen therapy    "6L; 24/7" (04/10/2016)  . OSA on CPAP    "wear mask sometimes" (04/10/2016)  . Paranoid ([place])    "sometimes; I'm on RX for it" (04/10/2016)  . Pneumonia    "I've had it several times; haven't had it since 06/2015" (04/10/2016)  . Schizophrenia ([place])   . Seasonal allergies   . Seizures ([place])    "don't know what kind; last one was ~ 1 yr ago" (04/10/2016)  . Sinus  trouble   . Stroke [facility]) 1980s   denies residual on 04/10/2016  . Type II diabetes mellitus ([place])     Past Surgical History:  Procedure Laterality Date  . CESAREAN SECTION  1997  . HERNIA REPAIR    . IR CHOLANGIOGRAM EXISTING TUBE  07/20/2016  . IR PERC CHOLECYSTOSTOMY  05/10/2016  . IR RADIOLOGIST EVAL & MGMT  06/08/2016  . IR RADIOLOGIST EVAL & MGMT  06/29/2016  . IR SINUS/FIST TUBE CHK-NON GI  07/12/2016  . TIBIA IM NAIL INSERTION Right 07/12/2016   Procedure: INTRAMEDULLARY (IM) NAIL RIGHT TIBIA;  Surgeon: Leandrew Koyanagi, MD;  Location: [facility];  Service: Orthopedics;  Laterality: Right;  . UMBILICAL HERNIA REPAIR  ~ 1963   "that's why I don't have a belly button"  . VAGINAL HYSTERECTOMY      There were no vitals filed for this visit.  Subjective Assessment - 01/03/17 1459    Subjective  RLE is hurting some today.  Exercises going well, reports compliance 4x/wk.    Pertinent History  currently uses 6LPM o2 via nasal cannula    Patient Stated Goals  unsure what to expect, to get the  the leg feeling better, improve strengh     Pain Score  7     Pain Location  Ankle    Pain Orientation  Right    Pain Descriptors / Indicators  Aching;Sharp    Pain Type  Chronic pain    Pain Onset  More than a month ago    Pain Frequency  Constant    Aggravating Factors   moving the                       [facility] Adult PT Treatment/Exercise - 01/03/17 1505      Exercises   Exercises  Knee/Hip;Ankle      Knee/Hip Exercises: Aerobic   Nustep  L5x 5 min; rest breaks needed and O2 monitored (88-95%)      Knee/Hip Exercises: Seated   Long Arc Quad  Right;10 reps    Heel Slides  Right;10 reps    Heel Slides Limitations  with towel and 2 sec hold for dorsiflexion      Manual Therapy   Manual Therapy  Joint mobilization    Joint Mobilization  attempted seated ankle mobs; pt with poor tolerance to light pressure so unable to fully perform      Ankle Exercises: Stretches   Soleus Stretch  Limitations  attempted; pt requested to sit and reported increased pain; provided as HEP to try at home but only do if she's able to tolerate    Gastroc Stretch  3 reps;30 seconds Rt    Gastroc Stretch Limitations  attempted with towel but pt unable to get a decent stretch due to UE weakness      Ankle Exercises: Seated   ABC's  1 rep    Ankle Circles/Pumps  Right;10 reps CW/CCW x 10 reps each with sustained LAQ    Heel Raises  20 reps;2 seconds Rt    Toe Raise  20 reps;2 seconds Rt    Other Seated Ankle Exercises  plantarflexion yellow theraband x 20 reps; Rt             PT Education - 01/03/17 1544    Education provided  Yes    Education Details  HEP    Person(s) Educated  Patient    Methods  Explanation;Demonstration;Handout;Verbal cues    Comprehension  Verbalized understanding;Returned demonstration;Need further instruction       PT Short Term Goals - 12/20/16 1556      PT SHORT TERM GOAL #1   Title  pt to be I with inital HEP    Time  4    Period  Weeks    Status  New    Target Date  01/17/17      PT SHORT TERM GOAL #2   Title  pt to demo techniques to reduce pain and inflammation via RICE and HEP in the R knee/ ankle    Time  4    Period  Weeks    Status  New    Target Date  01/17/17      PT SHORT TERM GOAL #3   Title  pt to be able to ambulate >/= 8 min with SPC with </= 7/10 pain for functional progression     Time  4    Period  Weeks    Status  New    Target Date  01/17/17        PT Long Term Goals - 12/20/16 1558      PT LONG TERM GOAL #1  Title  pt to increase R ankle DF / PF by >/= 6 degrees to promote ankle mobility and gait efficiency     Time  8    Period  Weeks    Status  New    Target Date  02/14/17      PT LONG TERM GOAL #2   Title  pt to increase ankle/ knee strength to >/= 4/5 with </=3/10 pain to promote ankle/ knee stability with prolonged walking/ standing activities.     Time  8    Period  Weeks    Status  New    Target  Date  02/14/17      PT LONG TERM GOAL #3   Title  pt to be able to walk for >/= 30 min with LRAD and pain </= 3/10 pain to promote functional endurance required for ADLs and community ambulation    Time  8    Period  Weeks    Status  New    Target Date  02/14/17      PT LONG TERM GOAL #4   Title  pt to increase FOTO score to >/= 79% limited to demo improvement of function     Time  8    Period  Weeks    Status  New    Target Date  02/14/17      PT LONG TERM GOAL #5   Title  pt to be I with all HEP given as of last visit     Time  8    Period  Weeks    Status  New    Target Date  02/14/17            Plan - 01/03/17 1544    Clinical Impression Statement  Pt tolerated seated exercises well, but increased pain with attempted manual therapy so unable to successfully perform joint mobilizations.  Monitored O2 throughtou on 6L and pt maintained >/= 88%.  No goals met as only 2nd visit.    PT Treatment/Interventions  ADLs/Self Care Home Management;Cryotherapy;Electrical Stimulation;Iontophoresis 43m/ml Dexamethasone;Moist Heat;Ultrasound;Balance training;Gait training;Stair training;Functional mobility training;Therapeutic exercise;Therapeutic activities;Manual techniques;Taping;Neuromuscular re-education;Patient/family education    PT Next Visit Plan  review/ update HEP, ankle /knee mobs (as tolerated), knee/ ankle strengthening/ stability (pt prefers seated exercise, would benefit from standing if willing), modalities PRN for pain    Consulted and Agree with Plan of Care  Patient       Patient will benefit from skilled therapeutic intervention in order to improve the following deficits and impairments:  Abnormal gait, Obesity, Pain, Decreased strength, Decreased knowledge of use of DME, Postural dysfunction, Improper body mechanics, Difficulty walking, Decreased mobility, Decreased endurance, Decreased range of motion, Decreased safety awareness, Decreased balance, Decreased activity  tolerance  Visit Diagnosis: Pain in right ankle and joints of right foot  Stiffness of right ankle, not elsewhere classified  Muscle weakness (generalized)  Stiffness of right knee, not elsewhere classified  Chronic pain of right knee  Abnormality of gait     Problem List Patient Active Problem List   Diagnosis Date Noted  . CKD (chronic kidney disease) 12/15/2016  . Carotid artery stenosis   . Right foot pain 11/15/2016  . Right carotid bruit 11/09/2016  . Hypokalemia   . Osteoarthritis 10/26/2016  . Dyspnea 10/07/2016  . Cardiac arrest ([place] 09/08/2016  . History of open reduction and internal fixation (ORIF) procedure   . Closed displaced transverse fracture of shaft of right tibia   . Fall   .  Overactive bladder 06/07/2016  . QT prolongation   . OSA and COPD overlap syndrome ([place])   . Generalized weakness   . Arthritis   . Bipolar I disorder ([place])   . Essential hypertension 03/22/2016  . Bipolar affective disorder, mixed, severe, with psychotic behavior ([place]) 11/28/2015  . Cocaine use disorder, severe, dependence ([place]) 11/28/2015  . Chronic diastolic congestive heart failure ([place])   . Chronic respiratory failure with hypoxia and hypercapnia ([place]) 06/22/2015  . Onychomycosis 01/21/2015  . Nasal congestion 12/17/2014  . Tobacco use disorder 07/22/2014  . COPD (chronic obstructive pulmonary disease) ([place]) 07/08/2014  . Knee pain, bilateral 01/22/2013  . Seizure ([place]) 01/04/2013  . Health care maintenance 11/25/2012  . History of kidney stones 06/18/2012  . Chronic pain syndrome 06/18/2012  . Allergic reaction 04/07/2012  . HSV infection 08/30/2011  . Dyslipidemia 04/24/2011  . Anemia 04/24/2011  . Diabetic neuropathy ([place]) 04/24/2011  . Obstructive sleep apnea 10/18/2010  . Asthma 10/18/2010  . Morbid obesity ([place]) 10/18/2010  . Type 2 diabetes mellitus ([place]) 10/18/2010      Laureen Abrahams, PT, DPT 01/03/17 3:47 PM     [facility] [address] [place], [place], [address] Phone: [phone]   Fax:  [phone]  Name: NANCYJO GIVHAN MRN: [id number] Date of Birth: July 03, 1960

## 2017-01-03 NOTE — Patient Instructions (Signed)
Gastroc Stretch    Stand with right foot back, leg straight, forward leg bent. Keeping heel on floor, turned slightly out, lean into wall until stretch is felt in calf. Hold __30__ seconds. Repeat __3__ times per set. Do _1___ sets per session. Do _1-2___ sessions per day.   Soleus Stretch    Stand with right foot back, both knees bent. Keeping heel on floor, turned slightly out, lean into wall until stretch is felt in lower calf. Hold __30__ seconds. Repeat __3__ times per set. Do __1__ sets per session. Do __1-2__ sessions per day.

## 2017-01-04 ENCOUNTER — Ambulatory Visit: Payer: Self-pay | Admitting: Internal Medicine

## 2017-01-04 ENCOUNTER — Other Ambulatory Visit (HOSPITAL_COMMUNITY): Payer: Self-pay | Admitting: Psychiatry

## 2017-01-04 ENCOUNTER — Ambulatory Visit: Payer: Medicare HMO | Admitting: Rehabilitation

## 2017-01-04 NOTE — Telephone Encounter (Signed)
Geodon has been discontinued

## 2017-01-04 NOTE — Telephone Encounter (Signed)
Attempted several times this week to reach pt and left a message for her to return call so we can sch appointment but she never returned my calls.

## 2017-01-05 ENCOUNTER — Emergency Department (HOSPITAL_COMMUNITY): Payer: Medicare HMO

## 2017-01-05 ENCOUNTER — Observation Stay (HOSPITAL_COMMUNITY)
Admission: EM | Admit: 2017-01-05 | Discharge: 2017-01-07 | Disposition: A | Discharge status: Home Health | Payer: Medicare HMO | Attending: Family Medicine | Admitting: Family Medicine

## 2017-01-05 ENCOUNTER — Encounter (HOSPITAL_COMMUNITY): Payer: Self-pay | Admitting: Pharmacy Technician

## 2017-01-05 DIAGNOSIS — R569 Unspecified convulsions: Secondary | ICD-10-CM | POA: Insufficient documentation

## 2017-01-05 DIAGNOSIS — F1410 Cocaine abuse, uncomplicated: Secondary | ICD-10-CM | POA: Diagnosis not present

## 2017-01-05 DIAGNOSIS — G4733 Obstructive sleep apnea (adult) (pediatric): Secondary | ICD-10-CM | POA: Diagnosis not present

## 2017-01-05 DIAGNOSIS — Z8249 Family history of ischemic heart disease and other diseases of the circulatory system: Secondary | ICD-10-CM | POA: Insufficient documentation

## 2017-01-05 DIAGNOSIS — Z9981 Dependence on supplemental oxygen: Secondary | ICD-10-CM | POA: Insufficient documentation

## 2017-01-05 DIAGNOSIS — R9431 Abnormal electrocardiogram [ECG] [EKG]: Secondary | ICD-10-CM

## 2017-01-05 DIAGNOSIS — Z79899 Other long term (current) drug therapy: Secondary | ICD-10-CM | POA: Insufficient documentation

## 2017-01-05 DIAGNOSIS — D649 Anemia, unspecified: Secondary | ICD-10-CM

## 2017-01-05 DIAGNOSIS — Z7984 Long term (current) use of oral hypoglycemic drugs: Secondary | ICD-10-CM | POA: Insufficient documentation

## 2017-01-05 DIAGNOSIS — K219 Gastro-esophageal reflux disease without esophagitis: Secondary | ICD-10-CM | POA: Insufficient documentation

## 2017-01-05 DIAGNOSIS — F319 Bipolar disorder, unspecified: Secondary | ICD-10-CM | POA: Insufficient documentation

## 2017-01-05 DIAGNOSIS — Z8042 Family history of malignant neoplasm of prostate: Secondary | ICD-10-CM | POA: Insufficient documentation

## 2017-01-05 DIAGNOSIS — E781 Pure hyperglyceridemia: Secondary | ICD-10-CM | POA: Insufficient documentation

## 2017-01-05 DIAGNOSIS — E1140 Type 2 diabetes mellitus with diabetic neuropathy, unspecified: Secondary | ICD-10-CM | POA: Diagnosis not present

## 2017-01-05 DIAGNOSIS — E1122 Type 2 diabetes mellitus with diabetic chronic kidney disease: Secondary | ICD-10-CM | POA: Diagnosis not present

## 2017-01-05 DIAGNOSIS — E785 Hyperlipidemia, unspecified: Secondary | ICD-10-CM | POA: Diagnosis not present

## 2017-01-05 DIAGNOSIS — I959 Hypotension, unspecified: Secondary | ICD-10-CM | POA: Insufficient documentation

## 2017-01-05 DIAGNOSIS — N289 Disorder of kidney and ureter, unspecified: Secondary | ICD-10-CM | POA: Diagnosis not present

## 2017-01-05 DIAGNOSIS — R918 Other nonspecific abnormal finding of lung field: Secondary | ICD-10-CM | POA: Diagnosis not present

## 2017-01-05 DIAGNOSIS — I130 Hypertensive heart and chronic kidney disease with heart failure and stage 1 through stage 4 chronic kidney disease, or unspecified chronic kidney disease: Secondary | ICD-10-CM | POA: Diagnosis not present

## 2017-01-05 DIAGNOSIS — Z87442 Personal history of urinary calculi: Secondary | ICD-10-CM | POA: Insufficient documentation

## 2017-01-05 DIAGNOSIS — Z8673 Personal history of transient ischemic attack (TIA), and cerebral infarction without residual deficits: Secondary | ICD-10-CM | POA: Diagnosis not present

## 2017-01-05 DIAGNOSIS — R0902 Hypoxemia: Secondary | ICD-10-CM | POA: Diagnosis not present

## 2017-01-05 DIAGNOSIS — G894 Chronic pain syndrome: Secondary | ICD-10-CM | POA: Insufficient documentation

## 2017-01-05 DIAGNOSIS — Z801 Family history of malignant neoplasm of trachea, bronchus and lung: Secondary | ICD-10-CM | POA: Insufficient documentation

## 2017-01-05 DIAGNOSIS — I252 Old myocardial infarction: Secondary | ICD-10-CM | POA: Insufficient documentation

## 2017-01-05 DIAGNOSIS — R55 Syncope and collapse: Secondary | ICD-10-CM | POA: Diagnosis present

## 2017-01-05 DIAGNOSIS — F419 Anxiety disorder, unspecified: Secondary | ICD-10-CM | POA: Diagnosis not present

## 2017-01-05 DIAGNOSIS — D473 Essential (hemorrhagic) thrombocythemia: Secondary | ICD-10-CM

## 2017-01-05 DIAGNOSIS — I6523 Occlusion and stenosis of bilateral carotid arteries: Secondary | ICD-10-CM | POA: Diagnosis not present

## 2017-01-05 DIAGNOSIS — D75839 Thrombocytosis, unspecified: Secondary | ICD-10-CM

## 2017-01-05 DIAGNOSIS — F200 Paranoid schizophrenia: Secondary | ICD-10-CM | POA: Insufficient documentation

## 2017-01-05 DIAGNOSIS — I5032 Chronic diastolic (congestive) heart failure: Secondary | ICD-10-CM | POA: Diagnosis not present

## 2017-01-05 DIAGNOSIS — R1011 Right upper quadrant pain: Secondary | ICD-10-CM | POA: Insufficient documentation

## 2017-01-05 DIAGNOSIS — M1990 Unspecified osteoarthritis, unspecified site: Secondary | ICD-10-CM | POA: Insufficient documentation

## 2017-01-05 DIAGNOSIS — Z7982 Long term (current) use of aspirin: Secondary | ICD-10-CM | POA: Insufficient documentation

## 2017-01-05 DIAGNOSIS — D631 Anemia in chronic kidney disease: Secondary | ICD-10-CM | POA: Insufficient documentation

## 2017-01-05 DIAGNOSIS — Z6841 Body Mass Index (BMI) 40.0 and over, adult: Secondary | ICD-10-CM | POA: Insufficient documentation

## 2017-01-05 DIAGNOSIS — Z87891 Personal history of nicotine dependence: Secondary | ICD-10-CM | POA: Insufficient documentation

## 2017-01-05 DIAGNOSIS — J439 Emphysema, unspecified: Secondary | ICD-10-CM | POA: Diagnosis not present

## 2017-01-05 DIAGNOSIS — Z7951 Long term (current) use of inhaled steroids: Secondary | ICD-10-CM | POA: Insufficient documentation

## 2017-01-05 DIAGNOSIS — N3281 Overactive bladder: Secondary | ICD-10-CM | POA: Insufficient documentation

## 2017-01-05 DIAGNOSIS — R7989 Other specified abnormal findings of blood chemistry: Secondary | ICD-10-CM

## 2017-01-05 DIAGNOSIS — E876 Hypokalemia: Principal | ICD-10-CM

## 2017-01-05 DIAGNOSIS — N183 Chronic kidney disease, stage 3 (moderate): Secondary | ICD-10-CM | POA: Insufficient documentation

## 2017-01-05 DIAGNOSIS — R011 Cardiac murmur, unspecified: Secondary | ICD-10-CM | POA: Insufficient documentation

## 2017-01-05 DIAGNOSIS — Z90710 Acquired absence of both cervix and uterus: Secondary | ICD-10-CM | POA: Insufficient documentation

## 2017-01-05 DIAGNOSIS — Z818 Family history of other mental and behavioral disorders: Secondary | ICD-10-CM | POA: Insufficient documentation

## 2017-01-05 DIAGNOSIS — R52 Pain, unspecified: Secondary | ICD-10-CM

## 2017-01-05 DIAGNOSIS — K8020 Calculus of gallbladder without cholecystitis without obstruction: Secondary | ICD-10-CM | POA: Insufficient documentation

## 2017-01-05 NOTE — ED Notes (Signed)
Patient transported to CT 

## 2017-01-05 NOTE — ED Provider Notes (Signed)
[facility] EMERGENCY DEPARTMENT Provider Note   CSN: [id number] Arrival date & time: 01/05/17  2256     History   Chief Complaint No chief complaint on file.   HPI Tiffany [name] is a 56 y.o. female.  The history is provided by the patient.  She has a very complex past history including cardiac arrest, asthma, chronic bronchitis, chronic heart failure, chronic kidney disease, emphysema, myocardial infarction, hypertension, hyperlipidemia, schizophrenia, diabetes.  [name] on chronic home oxygen.  Tonight, she had a syncopal episode.  She had no warning that she was going to pass out.  She denies chest pain, heaviness, tightness, pressure.  She denies change in her chronic dyspnea.  She denied any palpitations.  She denied any dizziness or lightheadedness.  Loss of consciousness was brief as family members heard her fall and came to see her immediately.  She denies any injury when she passed out.  This has never happened to her before.  Past Medical History:  Diagnosis Date  . Anxiety   . Arthritis    "all over" (04/10/2016)  . Asthma   . Cardiac arrest ([place]) 09/08/2016   PEA  . Carotid artery stenosis    1-39% bilateral by dopplers 11/2016  . Chronic bronchitis ([place])   . Chronic diastolic (congestive) heart failure ([place])   . Chronic kidney disease    "I see a kidney dr." (04/10/2016)  . Cocaine abuse ([place])   . Complication of anesthesia    decreased bp, decreased heart rate  . Depression   . Disorder of nervous system   . Emphysema   . GERD (gastroesophageal reflux disease)   . Heart attack ([place]) 1980s  . History of blood transfusion 1994   "couldn't stop bleeding from my period"  . Hyperlipidemia LDL goal <70   . Hypertension   . Incontinence   . Manic depression ([place])   . On home oxygen therapy    "6L; 24/7" (04/10/2016)  . OSA on CPAP    "wear mask sometimes" (04/10/2016)  . Paranoid ([place])    "sometimes; I'm on RX for it" (04/10/2016)  .  Pneumonia    "I've had it several times; haven't had it since 06/2015" (04/10/2016)  . Schizophrenia ([place])   . Seasonal allergies   . Seizures ([place])    "don't know what kind; last one was ~ 1 yr ago" (04/10/2016)  . Sinus trouble   . Stroke [facility]) 1980s   denies residual on 04/10/2016  . Type II diabetes mellitus [facility])     Patient Active Problem List   Diagnosis Date Noted  . CKD (chronic kidney disease) 12/15/2016  . Carotid artery stenosis   . Right foot pain 11/15/2016  . Right carotid bruit 11/09/2016  . Hypokalemia   . Osteoarthritis 10/26/2016  . Dyspnea 10/07/2016  . Cardiac arrest ([place]) 09/08/2016  . History of open reduction and internal fixation (ORIF) procedure   . Closed displaced transverse fracture of shaft of right tibia   . Fall   . Overactive bladder 06/07/2016  . QT prolongation   . OSA and COPD overlap syndrome ([place])   . Generalized weakness   . Arthritis   . Bipolar I disorder ([place])   . Essential hypertension 03/22/2016  . Bipolar affective disorder, mixed, severe, with psychotic behavior ([place]) 11/28/2015  . Cocaine use disorder, severe, dependence ([place]) 11/28/2015  . Chronic diastolic congestive heart failure ([place])   . Chronic respiratory failure with hypoxia and hypercapnia ([place]) 06/22/2015  .  Onychomycosis 01/21/2015  . Nasal congestion 12/17/2014  . Tobacco use disorder 07/22/2014  . COPD (chronic obstructive pulmonary disease) ([place]) 07/08/2014  . Knee pain, bilateral 01/22/2013  . Seizure ([place]) 01/04/2013  . Health care maintenance 11/25/2012  . History of kidney stones 06/18/2012  . Chronic pain syndrome 06/18/2012  . Allergic reaction 04/07/2012  . HSV infection 08/30/2011  . Dyslipidemia 04/24/2011  . Anemia 04/24/2011  . Diabetic neuropathy ([place]) 04/24/2011  . Obstructive sleep apnea 10/18/2010  . Asthma 10/18/2010  . Morbid obesity ([place]) 10/18/2010  . Type 2 diabetes mellitus ([place]) 10/18/2010    Past Surgical History:  Procedure  Laterality Date  . CESAREAN SECTION  1997  . HERNIA REPAIR    . IR CHOLANGIOGRAM EXISTING TUBE  07/20/2016  . IR PERC CHOLECYSTOSTOMY  05/10/2016  . IR RADIOLOGIST EVAL & MGMT  06/08/2016  . IR RADIOLOGIST EVAL & MGMT  06/29/2016  . IR SINUS/FIST TUBE CHK-NON GI  07/12/2016  . TIBIA IM NAIL INSERTION Right 07/12/2016   Procedure: INTRAMEDULLARY (IM) NAIL RIGHT TIBIA;  Surgeon: Leandrew Koyanagi, MD;  Location: [facility];  Service: Orthopedics;  Laterality: Right;  . UMBILICAL HERNIA REPAIR  ~ 1963   "that's why I don't have a belly button"  . VAGINAL HYSTERECTOMY      OB History    No data available       Home Medications    Prior to Admission medications   Medication Sig Start Date End Date Taking? Authorizing Provider  albuterol (PROVENTIL HFA;VENTOLIN HFA) 108 (90 Base) MCG/ACT inhaler Inhale 2 puffs every 6 (six) hours as needed into the lungs for wheezing or shortness of breath. 12/20/16   Mikell, Jeani Sow, MD  albuterol (PROVENTIL) (2.5 MG/3ML) 0.083% nebulizer solution Take 3 mLs (2.5 mg total) every 6 (six) hours as needed by nebulization for wheezing or shortness of breath. 11/21/16   Mikell, Jeani Sow, MD  amitriptyline (ELAVIL) 10 MG tablet Take 1 tablet (10 mg total) by mouth at bedtime. 12/21/16   Mikell, Jeani Sow, MD  amLODipine (NORVASC) 5 MG tablet Take 1 tablet (5 mg total) by mouth daily. 12/13/16   [name], MD  ARIPiprazole (ABILIFY) 5 MG tablet Take1 tab adily 12/11/16   Arfeen, Arlyce Harman, MD  aspirin 81 MG EC tablet Take 1 tablet (81 mg total) by mouth daily. Swallow whole. 06/13/16   Archie Patten, MD  atorvastatin (LIPITOR) 80 MG tablet Take 1 tablet (80 mg total) by mouth daily. 12/13/16   Mikell, Jeani Sow, MD  benztropine (COGENTIN) 0.5 MG tablet Take 1 tablet (0.5 mg total) by mouth 2 (two) times daily morning and evening 12/26/16   Arfeen, Arlyce Harman, MD  BREO ELLIPTA 100-25 MCG/INH AEPB INHALE 1 PUFF EVERY DAY 12/13/16   Mikell, Jeani Sow, MD    Diclofenac Sodium 3 % GEL Use on area of the pain 12/15/16   Mikell, Jeani Sow, MD  escitalopram (LEXAPRO) 20 MG tablet Take 1 tablet (20 mg total) by mouth daily. 12/11/16   Arfeen, Arlyce Harman, MD  flunisolide (NASALIDE) 25 MCG/ACT (0.025%) SOLN Place 2 sprays into the nose daily. 01/01/17   Mikell, Jeani Sow, MD  gabapentin (NEURONTIN) 600 MG tablet Take 1 tablet (600 mg total) by mouth 3 (three) times daily. 06/28/16   Archie Patten, MD  guaiFENesin (MUCINEX) 600 MG 12 hr tablet Take 1 tablet (600 mg total) by mouth 2 (two) times daily. 10/26/16   Mikell, Jeani Sow, MD  metFORMIN Ernestina Penna)  500 MG 24 hr tablet Take 1 tablet (500 mg total) by mouth 2 (two) times daily. 12/13/16   Mikell, Jeani Sow, MD  metolazone (ZAROXOLYN) 2.5 MG tablet Take 2.5 mg by mouth once a week.     [provider]  mirtazapine (REMERON SOL-TAB) 45 MG disintegrating tablet Take 1 tablet (45 mg total) by mouth at bedtime. 12/11/16 12/11/17  Arfeen, Arlyce Harman, MD  montelukast (SINGULAIR) 10 MG tablet Take 1 tablet (10 mg total) by mouth at bedtime. 06/13/16   Archie Patten, MD  Multiple Vitamin (MULTIVITAMIN WITH MINERALS) TABS tablet Take 1 tablet by mouth daily.    [provider]  omeprazole (PRILOSEC) 40 MG capsule Take 1 capsule (40 mg total) by mouth 2 (two) times daily. in the morning and evebing 11/30/16   Mikell, Jeani Sow, MD  OXYGEN Inhale 6 L into the lungs daily.    [provider]  potassium chloride (K-DUR) 10 MEQ tablet Take 6 tablets (60 mEq total) by mouth 2 (two) times daily. 12/19/16   Shirley Friar, PA-C  tiotropium (SPIRIVA HANDIHALER) 18 MCG inhalation capsule Place 1 capsule (18 mcg total) into inhaler and inhale daily. 06/13/16   Archie Patten, MD  torsemide (DEMADEX) 20 MG tablet Take 4 tablets (80 mg total) by mouth 2 (two) times daily. 10/31/16   Bhagat, Crista Luria, PA  Trolamine Salicylate 10 % LOTN Apply 1 patch topically 3 (three)  times daily as needed. 10/26/16   [name], MD    Family History Family History  Problem Relation Age of Onset  . Cancer Father        prostate  . Cancer Mother        lung  . Depression Mother   . Depression Sister   . Anxiety disorder Sister   . Schizophrenia Sister   . Bipolar disorder Sister   . Depression Sister   . Depression Brother   . Heart failure Unknown        cousin    Social History Social History   Tobacco Use  . Smoking status: Former Smoker    Packs/day: 1.50    Years: 38.00    Pack years: 57.00    Types: Cigarettes    Start date: 03/13/1977    Last attempt to quit: 04/10/2016    Years since quitting: 0.7  . Smokeless tobacco: Never Used  Substance Use Topics  . Alcohol use: No    Alcohol/week: 0.0 oz  . Drug use: No    Comment: 04/10/2016 "last used cocaine back in November 2017"     Allergies   Hydrocodone-acetaminophen; Hydroxyzine; Latuda [lurasidone hcl]; Codeine; and Sulfa antibiotics   Review of Systems Review of Systems  All other systems reviewed and are negative.    Physical Exam Updated Vital Signs BP (!) 114/98 (BP Location: Right Arm)   Pulse 92   Temp 98.9 F (37.2 C) (Oral)   Resp 20   SpO2 94%   Physical Exam  Nursing note and vitals reviewed.  Morbidly obese 56 year old female, resting comfortably and in no acute distress. Vital signs are significant for diastolic hypertension. Oxygen saturation is 94%, which is normal. Head is normocephalic and atraumatic. PERRLA, EOMI. Oropharynx is clear. Neck is nontender and supple without adenopathy or JVD. Back is nontender and there is no CVA tenderness. Lungs are clear without rales, wheezes, or rhonchi. Chest is nontender. Heart has regular rate and rhythm without murmur. Abdomen is soft, flat, nontender without  masses or hepatosplenomegaly and peristalsis is normoactive. Extremities have no cyanosis or edema, full range of motion is present. Skin is warm and  dry without rash. Neurologic: Mental status is normal, cranial nerves are intact, there are no motor or sensory deficits.  ED Treatments / Results  Labs (all labs ordered are listed, but only abnormal results are displayed) Labs Reviewed  COMPREHENSIVE METABOLIC PANEL - Abnormal; Notable for the following components:      Result Value   Potassium 2.6 (*)    Chloride 91 (*)    CO2 35 (*)    BUN 25 (*)    Creatinine, Ser 1.65 (*)    Alkaline Phosphatase 168 (*)    GFR calc non Af Amer 33 (*)    GFR calc Af Amer 39 (*)    All other components within normal limits  CBC WITH DIFFERENTIAL/PLATELET - Abnormal; Notable for the following components:   WBC 12.1 (*)    RBC 3.50 (*)    Hemoglobin 9.9 (*)    HCT 32.9 (*)    RDW 16.1 (*)    Platelets 439 (*)    Neutro Abs 8.7 (*)    All other components within normal limits  MAGNESIUM - Abnormal; Notable for the following components:   Magnesium 1.5 (*)    All other components within normal limits  I-STAT CG4 LACTIC ACID, ED - Abnormal; Notable for the following components:   Lactic Acid, Venous 2.17 (*)    All other components within normal limits  URINALYSIS, ROUTINE W REFLEX MICROSCOPIC  I-STAT TROPONIN, ED  I-STAT CG4 LACTIC ACID, ED    EKG  EKG Interpretation  Date/Time:  Friday January 05 2017 23:13:42 EST Ventricular Rate:  92 PR Interval:    QRS Duration: 103 QT Interval:  470 QTC Calculation: 582 R Axis:   61 Text Interpretation:  Sinus rhythm Prolonged QT interval Nonspecific T wave abnormality When compared with ECG of 11/03/2016, QT has lengthened , but another ECG on 11/03/2016 showed similar QT prolongation Confirmed by Delora Fuel ([id number]) on 01/05/2017 11:18:28 PM       Radiology Ct Head Wo Contrast  Result Date: 01/06/2017 CLINICAL DATA:  56 year old female with syncope. EXAM: CT HEAD WITHOUT CONTRAST TECHNIQUE: Contiguous axial images were obtained from the base of the skull through the vertex without  intravenous contrast. COMPARISON:  Head CT dated 06/12/2016 FINDINGS: Brain: Mild bifrontal atrophy as well as chronic microvascular ischemic changes. There is no acute intracranial hemorrhage. No mass effect or midline shift. No extra-axial fluid collection. Probably partially empty sella. Vascular: No hyperdense vessel or unexpected calcification. Skull: Normal. Negative for fracture or focal lesion. Sinuses/Orbits: The visualized paranasal sinuses are clear. There is mild bilateral mastoid effusion. Other: None IMPRESSION: 1. No acute intracranial pathology. 2. Mild age-related atrophy and chronic microvascular ischemic changes. Electronically Signed   By: Anner Crete M.D.   On: 01/06/2017 00:12   Dg Chest Port 1 View  Result Date: 01/06/2017 CLINICAL DATA:  Acute onset of generalized weakness and syncope. EXAM: PORTABLE CHEST 1 VIEW COMPARISON:  Chest radiograph and CTA of the chest performed 11/03/2016 FINDINGS: The lungs are hypoexpanded. Mild retrocardiac airspace opacity could reflect pneumonia. No pleural effusion or pneumothorax is seen. The cardiomediastinal silhouette is mildly enlarged. No acute osseous abnormalities are identified. IMPRESSION: 1. Lungs hypoexpanded. Mild retrocardiac airspace opacity could reflect pneumonia. 2. Mild cardiomegaly. Electronically Signed   By: Garald Balding M.D.   On: 01/06/2017 00:46    Procedures Procedures (  including critical care time) CRITICAL CARE Performed by: Delora Fuel Total critical care time: 45 minutes Critical care time was exclusive of separately billable procedures and treating other patients. Critical care was necessary to treat or prevent imminent or life-threatening deterioration. Critical care was time spent personally by me on the following activities: development of treatment plan with patient and/or surrogate as well as nursing, discussions with consultants, evaluation of patient's response to treatment, examination of patient,  obtaining history from patient or surrogate, ordering and performing treatments and interventions, ordering and review of laboratory studies, ordering and review of radiographic studies, pulse oximetry and re-evaluation of patient's condition.  Medications Ordered in ED Medications  magnesium sulfate IVPB 2 g 50 mL (not administered)  potassium chloride 10 mEq in 100 mL IVPB (not administered)  potassium chloride SA (K-DUR,KLOR-CON) CR tablet 40 mEq (not administered)     Initial Impression / Assessment and Plan / ED Course  I have reviewed the triage vital signs and the nursing notes.  Pertinent labs & imaging results that were available during my care of the patient were reviewed by me and considered in my medical decision making (see chart for details).  Syncope with no antecedent symptoms worrisome for underlying cardiac syncope.  She does have history of cardiac arrest, so cardiac arrhythmias are certainly worrisome possibility.  ECG does show prolonged QT interval.  Screening labs will be checked and she will be sent for chest x-ray and CT of head.  Anticipate need for admission for overnight monitoring.  We will give magnesium if magnesium level not elevated on lab testing.  Magnesium is come back at 1.5, potassium 2.6.  [name] given aggressive potassium and magnesium supplementation.  Both of these clearly put her at risk for arrhythmias and there is concerned that arrhythmia was the cause of her syncope.  It is noted that she is prescribed torsemide and metolazone and these are likely the causes of her electrolyte disturbance.  Lactic acid level is come back mildly elevated and this is felt to be related to her syncope and not sepsis.  Chest x-ray is read as possible pneumonia.  Clinically, she does not have pneumonia and antibiotics are not being initiated.  Renal insufficiency and anemia are unchanged from baseline.  Case is discussed with Dr. Ola Spurr of family practice service who  agrees to admit the patient.  Final Clinical Impressions(s) / ED Diagnoses   Final diagnoses:  Syncope, unspecified syncope type  Hypokalemia  Renal insufficiency  Normochromic normocytic anemia  Thrombocytosis (HCC)  Prolonged Q-T interval on ECG  Hypomagnesemia  Elevated lactic acid level    ED Discharge Orders    None       Delora Fuel, MD 81/01/75 [phone]

## 2017-01-05 NOTE — ED Triage Notes (Signed)
Pt was walking to restroom,became weak and then had syncopal episode. Family lowered pt to floor. Caretaker states pt is not acting right, but is unable to specify how. Pt with hx of MI, stroke. A&OX4 upon arrival. BP 113/75, 101 SR, RR20, 96% on 6L [place] (wears continuously).

## 2017-01-06 ENCOUNTER — Other Ambulatory Visit: Payer: Self-pay

## 2017-01-06 ENCOUNTER — Emergency Department (HOSPITAL_COMMUNITY): Payer: Medicare HMO

## 2017-01-06 ENCOUNTER — Inpatient Hospital Stay (HOSPITAL_COMMUNITY): Payer: Medicare HMO

## 2017-01-06 DIAGNOSIS — Z87891 Personal history of nicotine dependence: Secondary | ICD-10-CM

## 2017-01-06 DIAGNOSIS — G4733 Obstructive sleep apnea (adult) (pediatric): Secondary | ICD-10-CM | POA: Diagnosis not present

## 2017-01-06 DIAGNOSIS — Z818 Family history of other mental and behavioral disorders: Secondary | ICD-10-CM

## 2017-01-06 DIAGNOSIS — R9431 Abnormal electrocardiogram [ECG] [EKG]: Secondary | ICD-10-CM | POA: Diagnosis not present

## 2017-01-06 DIAGNOSIS — R55 Syncope and collapse: Secondary | ICD-10-CM | POA: Diagnosis present

## 2017-01-06 DIAGNOSIS — E1122 Type 2 diabetes mellitus with diabetic chronic kidney disease: Secondary | ICD-10-CM | POA: Diagnosis not present

## 2017-01-06 DIAGNOSIS — R0602 Shortness of breath: Secondary | ICD-10-CM

## 2017-01-06 DIAGNOSIS — F200 Paranoid schizophrenia: Secondary | ICD-10-CM | POA: Diagnosis not present

## 2017-01-06 DIAGNOSIS — N183 Chronic kidney disease, stage 3 (moderate): Secondary | ICD-10-CM | POA: Diagnosis not present

## 2017-01-06 DIAGNOSIS — I130 Hypertensive heart and chronic kidney disease with heart failure and stage 1 through stage 4 chronic kidney disease, or unspecified chronic kidney disease: Secondary | ICD-10-CM | POA: Diagnosis not present

## 2017-01-06 DIAGNOSIS — R918 Other nonspecific abnormal finding of lung field: Secondary | ICD-10-CM | POA: Diagnosis not present

## 2017-01-06 DIAGNOSIS — D631 Anemia in chronic kidney disease: Secondary | ICD-10-CM | POA: Diagnosis not present

## 2017-01-06 DIAGNOSIS — E876 Hypokalemia: Secondary | ICD-10-CM | POA: Diagnosis not present

## 2017-01-06 DIAGNOSIS — I5032 Chronic diastolic (congestive) heart failure: Secondary | ICD-10-CM | POA: Diagnosis not present

## 2017-01-06 DIAGNOSIS — K8020 Calculus of gallbladder without cholecystitis without obstruction: Secondary | ICD-10-CM | POA: Diagnosis not present

## 2017-01-06 LAB — BLOOD GAS, ARTERIAL
Acid-Base Excess: 14.1 mmol/L — ABNORMAL HIGH (ref 0.0–2.0)
Bicarbonate: 38.8 mmol/L — ABNORMAL HIGH (ref 20.0–28.0)
Drawn by: 52077
O2 Content: 6 L/min
O2 Saturation: 98.9 %
Patient temperature: 98.6
pCO2 arterial: 54.5 mmHg — ABNORMAL HIGH (ref 32.0–48.0)
pH, Arterial: 7.466 — ABNORMAL HIGH (ref 7.350–7.450)
pO2, Arterial: 141 mmHg — ABNORMAL HIGH (ref 83.0–108.0)

## 2017-01-06 LAB — CBC WITH DIFFERENTIAL/PLATELET
Basophils Absolute: 0 10*3/uL (ref 0.0–0.1)
Basophils Relative: 0 %
Eosinophils Absolute: 0.3 10*3/uL (ref 0.0–0.7)
Eosinophils Relative: 2 %
HCT: 32.9 % — ABNORMAL LOW (ref 36.0–46.0)
Hemoglobin: 9.9 g/dL — ABNORMAL LOW (ref 12.0–15.0)
Lymphocytes Relative: 22 %
Lymphs Abs: 2.7 10*3/uL (ref 0.7–4.0)
MCH: 28.3 pg (ref 26.0–34.0)
MCHC: 30.1 g/dL (ref 30.0–36.0)
MCV: 94 fL (ref 78.0–100.0)
Monocytes Absolute: 0.4 10*3/uL (ref 0.1–1.0)
Monocytes Relative: 4 %
Neutro Abs: 8.7 10*3/uL — ABNORMAL HIGH (ref 1.7–7.7)
Neutrophils Relative %: 72 %
Platelets: 439 10*3/uL — ABNORMAL HIGH (ref 150–400)
RBC: 3.5 MIL/uL — ABNORMAL LOW (ref 3.87–5.11)
RDW: 16.1 % — ABNORMAL HIGH (ref 11.5–15.5)
WBC: 12.1 10*3/uL — ABNORMAL HIGH (ref 4.0–10.5)

## 2017-01-06 LAB — CBC
HCT: 30.5 % — ABNORMAL LOW (ref 36.0–46.0)
Hemoglobin: 9.1 g/dL — ABNORMAL LOW (ref 12.0–15.0)
MCH: 27.9 pg (ref 26.0–34.0)
MCHC: 29.8 g/dL — ABNORMAL LOW (ref 30.0–36.0)
MCV: 93.6 fL (ref 78.0–100.0)
Platelets: 436 10*3/uL — ABNORMAL HIGH (ref 150–400)
RBC: 3.26 MIL/uL — ABNORMAL LOW (ref 3.87–5.11)
RDW: 15.9 % — ABNORMAL HIGH (ref 11.5–15.5)
WBC: 12.2 10*3/uL — ABNORMAL HIGH (ref 4.0–10.5)

## 2017-01-06 LAB — MAGNESIUM
Magnesium: 1.5 mg/dL — ABNORMAL LOW (ref 1.7–2.4)
Magnesium: 2.2 mg/dL (ref 1.7–2.4)

## 2017-01-06 LAB — BASIC METABOLIC PANEL
Anion gap: 11 (ref 5–15)
BUN: 21 mg/dL — ABNORMAL HIGH (ref 6–20)
CO2: 37 mmol/L — ABNORMAL HIGH (ref 22–32)
Calcium: 9.2 mg/dL (ref 8.9–10.3)
Chloride: 91 mmol/L — ABNORMAL LOW (ref 101–111)
Creatinine, Ser: 1.54 mg/dL — ABNORMAL HIGH (ref 0.44–1.00)
GFR calc Af Amer: 42 mL/min — ABNORMAL LOW (ref >60)
GFR calc non Af Amer: 36 mL/min — ABNORMAL LOW (ref >60)
Glucose, Bld: 134 mg/dL — ABNORMAL HIGH (ref 65–99)
Potassium: 2.7 mmol/L — CL (ref 3.5–5.1)
Sodium: 139 mmol/L (ref 135–145)

## 2017-01-06 LAB — COMPREHENSIVE METABOLIC PANEL
ALT: 16 U/L (ref 14–54)
ALT: 16 U/L (ref 14–54)
AST: 21 U/L (ref 15–41)
AST: 18 U/L (ref 15–41)
Albumin: 3.5 g/dL (ref 3.5–5.0)
Albumin: 3.3 g/dL — ABNORMAL LOW (ref 3.5–5.0)
Alkaline Phosphatase: 168 U/L — ABNORMAL HIGH (ref 38–126)
Alkaline Phosphatase: 158 U/L — ABNORMAL HIGH (ref 38–126)
Anion gap: 14 (ref 5–15)
Anion gap: 10 (ref 5–15)
BUN: 25 mg/dL — ABNORMAL HIGH (ref 6–20)
BUN: 24 mg/dL — ABNORMAL HIGH (ref 6–20)
CO2: 35 mmol/L — ABNORMAL HIGH (ref 22–32)
CO2: 38 mmol/L — ABNORMAL HIGH (ref 22–32)
Calcium: 9.1 mg/dL (ref 8.9–10.3)
Calcium: 8.7 mg/dL — ABNORMAL LOW (ref 8.9–10.3)
Chloride: 91 mmol/L — ABNORMAL LOW (ref 101–111)
Chloride: 93 mmol/L — ABNORMAL LOW (ref 101–111)
Creatinine, Ser: 1.65 mg/dL — ABNORMAL HIGH (ref 0.44–1.00)
Creatinine, Ser: 1.61 mg/dL — ABNORMAL HIGH (ref 0.44–1.00)
GFR calc Af Amer: 39 mL/min — ABNORMAL LOW (ref >60)
GFR calc Af Amer: 40 mL/min — ABNORMAL LOW (ref >60)
GFR calc non Af Amer: 33 mL/min — ABNORMAL LOW (ref >60)
GFR calc non Af Amer: 34 mL/min — ABNORMAL LOW (ref >60)
Glucose, Bld: 96 mg/dL (ref 65–99)
Glucose, Bld: 100 mg/dL — ABNORMAL HIGH (ref 65–99)
Potassium: 2.6 mmol/L — CL (ref 3.5–5.1)
Potassium: 2.6 mmol/L — CL (ref 3.5–5.1)
Sodium: 140 mmol/L (ref 135–145)
Sodium: 141 mmol/L (ref 135–145)
Total Bilirubin: 0.4 mg/dL (ref 0.3–1.2)
Total Bilirubin: 0.8 mg/dL (ref 0.3–1.2)
Total Protein: 7 g/dL (ref 6.5–8.1)
Total Protein: 7 g/dL (ref 6.5–8.1)

## 2017-01-06 LAB — CK: Total CK: 93 U/L (ref 38–234)

## 2017-01-06 LAB — I-STAT TROPONIN, ED: Troponin i, poc: 0 ng/mL (ref 0.00–0.08)

## 2017-01-06 LAB — I-STAT CG4 LACTIC ACID, ED
Lactic Acid, Venous: 1.35 mmol/L (ref 0.5–1.9)
Lactic Acid, Venous: 2.17 mmol/L (ref 0.5–1.9)

## 2017-01-06 LAB — ETHANOL: Alcohol, Ethyl (B): <10 mg/dL (ref <10)

## 2017-01-06 LAB — GLUCOSE, CAPILLARY
Glucose-Capillary: 86 mg/dL (ref 65–99)
Glucose-Capillary: 114 mg/dL — ABNORMAL HIGH (ref 65–99)
Glucose-Capillary: 98 mg/dL (ref 65–99)

## 2017-01-06 MED ORDER — MAGNESIUM SULFATE 2 GM/50ML IV SOLN
2.0000 g | Freq: Once | INTRAVENOUS | Status: AC
  Administered 2017-01-06 (×2): 2 g via INTRAVENOUS
  Filled 2017-01-06: qty 50

## 2017-01-06 MED ORDER — POLYETHYLENE GLYCOL 3350 17 G PO PACK: 17.0000 g | PACK | Freq: Every day | ORAL | Status: DC | PRN

## 2017-01-06 MED ORDER — POTASSIUM CHLORIDE 10 MEQ/100ML IV SOLN
INTRAVENOUS | Status: AC
  Administered 2017-01-06: 10 meq
  Filled 2017-01-06: qty 100

## 2017-01-06 MED ORDER — POTASSIUM CHLORIDE CRYS ER 20 MEQ PO TBCR
40.0000 meq | EXTENDED_RELEASE_TABLET | Freq: Once | ORAL | Status: AC
  Administered 2017-01-06: 40 meq via ORAL
  Filled 2017-01-06: qty 2

## 2017-01-06 MED ORDER — TIOTROPIUM BROMIDE MONOHYDRATE 18 MCG IN CAPS
18.0000 ug | ORAL_CAPSULE | Freq: Every day | RESPIRATORY_TRACT | Status: DC
  Administered 2017-01-07: 18 ug via RESPIRATORY_TRACT
  Filled 2017-01-06 (×2): qty 5

## 2017-01-06 MED ORDER — BENZTROPINE MESYLATE 0.5 MG PO TABS
0.5000 mg | ORAL_TABLET | Freq: Two times a day (BID) | ORAL | Status: DC
  Administered 2017-01-06: 0.5 mg via ORAL
  Filled 2017-01-06 (×5): qty 1

## 2017-01-06 MED ORDER — ALBUTEROL SULFATE (2.5 MG/3ML) 0.083% IN NEBU: 2.5000 mg | INHALATION_SOLUTION | Freq: Four times a day (QID) | RESPIRATORY_TRACT | Status: DC | PRN

## 2017-01-06 MED ORDER — GABAPENTIN 600 MG PO TABS
600.0000 mg | ORAL_TABLET | Freq: Three times a day (TID) | ORAL | Status: DC
  Administered 2017-01-06 – 2017-01-07 (×5): 600 mg via ORAL
  Filled 2017-01-06 (×5): qty 1

## 2017-01-06 MED ORDER — PANTOPRAZOLE SODIUM 40 MG PO TBEC
40.0000 mg | DELAYED_RELEASE_TABLET | Freq: Every day | ORAL | Status: DC
  Administered 2017-01-06 – 2017-01-07 (×2): 40 mg via ORAL
  Filled 2017-01-06 (×2): qty 1

## 2017-01-06 MED ORDER — INSULIN ASPART 100 UNIT/ML ~~LOC~~ SOLN
0.0000 [IU] | Freq: Three times a day (TID) | SUBCUTANEOUS | Status: DC
  Administered 2017-01-07: 1 [IU] via SUBCUTANEOUS

## 2017-01-06 MED ORDER — ACETAMINOPHEN 325 MG PO TABS
650.0000 mg | ORAL_TABLET | Freq: Four times a day (QID) | ORAL | Status: DC | PRN
  Administered 2017-01-06: 650 mg via ORAL
  Filled 2017-01-06: qty 2

## 2017-01-06 MED ORDER — POTASSIUM CHLORIDE CRYS ER 20 MEQ PO TBCR
40.0000 meq | EXTENDED_RELEASE_TABLET | ORAL | Status: AC
  Administered 2017-01-06 – 2017-01-07 (×3): 40 meq via ORAL
  Filled 2017-01-06 (×3): qty 2

## 2017-01-06 MED ORDER — ASPIRIN EC 81 MG PO TBEC
81.0000 mg | DELAYED_RELEASE_TABLET | Freq: Every day | ORAL | Status: DC
  Administered 2017-01-06 – 2017-01-07 (×2): 81 mg via ORAL
  Filled 2017-01-06 (×2): qty 1

## 2017-01-06 MED ORDER — MIRTAZAPINE 15 MG PO TABS
45.0000 mg | ORAL_TABLET | Freq: Every day | ORAL | Status: DC
  Administered 2017-01-06: 45 mg via ORAL
  Filled 2017-01-06 (×2): qty 3

## 2017-01-06 MED ORDER — HEPARIN SODIUM (PORCINE) 5000 UNIT/ML IJ SOLN
5000.0000 [IU] | Freq: Three times a day (TID) | INTRAMUSCULAR | Status: DC
  Administered 2017-01-06 – 2017-01-07 (×3): 5000 [IU] via SUBCUTANEOUS
  Filled 2017-01-06 (×4): qty 1

## 2017-01-06 MED ORDER — ACETAMINOPHEN 650 MG RE SUPP: 650.0000 mg | Freq: Four times a day (QID) | RECTAL | Status: DC | PRN

## 2017-01-06 MED ORDER — MONTELUKAST SODIUM 10 MG PO TABS
10.0000 mg | ORAL_TABLET | Freq: Every day | ORAL | Status: DC
  Administered 2017-01-06: 10 mg via ORAL
  Filled 2017-01-06: qty 1

## 2017-01-06 MED ORDER — ARIPIPRAZOLE 5 MG PO TABS
5.0000 mg | ORAL_TABLET | Freq: Every day | ORAL | Status: DC
  Administered 2017-01-06 – 2017-01-07 (×2): 5 mg via ORAL
  Filled 2017-01-06 (×3): qty 1

## 2017-01-06 MED ORDER — TORSEMIDE 20 MG PO TABS
80.0000 mg | ORAL_TABLET | Freq: Two times a day (BID) | ORAL | Status: DC
  Administered 2017-01-06 – 2017-01-07 (×4): 80 mg via ORAL
  Filled 2017-01-06 (×4): qty 4

## 2017-01-06 MED ORDER — POTASSIUM CHLORIDE 10 MEQ/100ML IV SOLN: 10.0000 meq | INTRAVENOUS | Status: DC

## 2017-01-06 MED ORDER — FLUTICASONE FUROATE-VILANTEROL 100-25 MCG/INH IN AEPB
1.0000 | INHALATION_SPRAY | Freq: Every day | RESPIRATORY_TRACT | Status: DC
  Administered 2017-01-07: 1 via RESPIRATORY_TRACT
  Filled 2017-01-06 (×2): qty 28

## 2017-01-06 MED ORDER — SODIUM CHLORIDE 0.9% FLUSH
3.0000 mL | Freq: Two times a day (BID) | INTRAVENOUS | Status: DC
  Administered 2017-01-06 – 2017-01-07 (×3): 3 mL via INTRAVENOUS

## 2017-01-06 MED ORDER — AMITRIPTYLINE HCL 10 MG PO TABS
10.0000 mg | ORAL_TABLET | Freq: Every day | ORAL | Status: DC
  Filled 2017-01-06: qty 1

## 2017-01-06 MED ORDER — POTASSIUM CHLORIDE 10 MEQ/100ML IV SOLN
10.0000 meq | INTRAVENOUS | Status: AC
  Administered 2017-01-06: 10 meq via INTRAVENOUS
  Filled 2017-01-06 (×3): qty 100

## 2017-01-06 MED ORDER — ESCITALOPRAM OXALATE 10 MG PO TABS
20.0000 mg | ORAL_TABLET | Freq: Every day | ORAL | Status: DC
  Administered 2017-01-06 – 2017-01-07 (×2): 20 mg via ORAL
  Filled 2017-01-06 (×2): qty 2

## 2017-01-06 MED ORDER — POTASSIUM CHLORIDE CRYS ER 20 MEQ PO TBCR
40.0000 meq | EXTENDED_RELEASE_TABLET | ORAL | Status: AC
  Administered 2017-01-06 (×2): 40 meq via ORAL
  Filled 2017-01-06 (×2): qty 2

## 2017-01-06 MED ORDER — ATORVASTATIN CALCIUM 80 MG PO TABS
80.0000 mg | ORAL_TABLET | Freq: Every day | ORAL | Status: DC
  Administered 2017-01-06 – 2017-01-07 (×2): 80 mg via ORAL
  Filled 2017-01-06 (×2): qty 1

## 2017-01-06 NOTE — Discharge Summary (Signed)
[facility] Discharge Summary  Patient name: Tiffany [name] Medical record number: [id number] Date of birth: 1961-01-05 Age: 56 y.o. Gender: female Date of Admission: 01/05/2017  Date of Discharge: 01/07/17 Admitting Physician: Kinnie Feil, MD  Primary Care Provider: [name], MD Consultants: psych  Indication for Hospitalization: syncope   Discharge Diagnoses/Problem List:  Syncope Hypokalemia Hypomagnesemia Abdominal pain Anemia CKD stage 3 HFpEF COPD HTN Type 2 DM OSA Bipolar disorder HLD   Disposition: to home with close pcp followup  Discharge Condition: stable  Discharge Exam:  General: awake and alert, laying in bed, CPAP in place  Cardiovascular: RRR, no MRG Respiratory: CTAB, no wheezes, rales, or rhonchi  Abdomen: soft, non tender, non distended, bowel sounds normal  Extremities: tenderness of RLE, 3/5 muscle strength of RLE, 4/5 strength in LLE, normal grip strength  Neuro: no focal deficits, PERRL, EOMI     Brief Hospital Course:  Tiffany [name] is a 56 y.o. female presenting for syncopal workup. Patient had unwitnessed syncopal episode on morning of admission. Unclear etiology, however likely multifactorial related to deconditioning, medication management, and electrolyte imbalance. It is likely that patient was overdiuresis with torsemide, causing orthostasis and hypokalemia. CT head was negative for acute findings. EKG showing QTc prolongation, but this seems similar to previous EKG findings. PT consulted and recommended home health PT.    On admission patient had repetitive speech. This resolved within that day. Psych had no recommendations as speech improved prior to their assessment.   On admission K was low at 2.6. K was repleted with IV K and Kdur and improved to 2.9 prior to discharge.   On admission Mg was low at 1.5. Mg was repleted with IV Mg and improved to 1.8 prior to discharge.   During  admission psych was consulted and recommend continuing home medications (Abilify 5 mg daily, Cogentin 0.5 mg twice a day, Lexapro 20 mg daily and Remeron 45 mg at bedtime) and to have outpatient follow up with psychiatry.   Issues for Follow Up:  1. Monitor QTc. Patient with prolonged QTc during hospitalization but on many QT prolonging agents  2. Will need outpatient follow up with psychiatry  3. Monitor for repetitive speech, consider MRI if worsening  4. Monitor medication management, likely patient may be overdiuresing  5. Metolazone was stopped as patient may have been over-diuresing, consider restarting as outpatient if needed   Significant Procedures:   Significant Labs and Imaging:  Recent Labs  Lab 01/06/17 0046 01/06/17 0508 01/07/17 0842  WBC 12.1* 12.2* 10.1  HGB 9.9* 9.1* 8.9*  HCT 32.9* 30.5* 29.8*  PLT 439* 436* 396   Recent Labs  Lab 01/06/17 0046 01/06/17 0356 01/06/17 0508 01/06/17 2139 01/07/17 0842 01/07/17 1500  NA 140  --  141 139 140  --   K 2.6*  --  2.6* 2.7* 3.0* 2.9*  CL 91*  --  93* 91* 94*  --   CO2 35*  --  38* 37* 35*  --   GLUCOSE 96  --  100* 134* 146*  --   BUN 25*  --  24* 21* 24*  --   CREATININE 1.65*  --  1.61* 1.54* 1.46*  --   CALCIUM 9.1  --  8.7* 9.2 9.1  --   MG 1.5* 2.2  --   --  1.8  --   ALKPHOS 168*  --  158*  --   --   --   AST  21  --  18  --   --   --   ALT 16  --  16  --   --   --   ALBUMIN 3.5  --  3.3*  --   --   --      Ref. Range 01/06/2017 05:00  Sample type Unknown ARTERIAL DRAW  Delivery systems Unknown NASAL CANNULA  O2 Content Latest Units: L/min 6.0  pH, Arterial Latest Ref Range: 7.350 - 7.450  7.466 (H)  pCO2 arterial Latest Ref Range: 32.0 - 48.0 mmHg 54.5 (H)  pO2, Arterial Latest Ref Range: 83.0 - 108.0 mmHg 141 (H)  Acid-Base Excess Latest Ref Range: 0.0 - 2.0 mmol/L 14.1 (H)  Bicarbonate Latest Ref Range: 20.0 - 28.0 mmol/L 38.8 (H)  O2 Saturation Latest Units: % 98.9  Patient temperature Unknown  98.6  Collection site Unknown RIGHT RADIAL  Allens test (pass/fail) Latest Ref Range: PASS  PASS    Ref. Range 01/06/2017 03:48  CK Total Latest Ref Range: 38 - 234 U/L 93    Ref. Range 01/06/2017 05:08  Alcohol, Ethyl (B) Latest Ref Range: <10 mg/dL      Ct Head Wo Contrast  Result Date: 01/06/2017 CLINICAL DATA:  56 year old female with syncope. EXAM: CT HEAD WITHOUT CONTRAST TECHNIQUE: Contiguous axial images were obtained from the base of the skull through the vertex without intravenous contrast. COMPARISON:  Head CT dated 06/12/2016 FINDINGS: Brain: Mild bifrontal atrophy as well as chronic microvascular ischemic changes. There is no acute intracranial hemorrhage. No mass effect or midline shift. No extra-axial fluid collection. Probably partially empty sella. Vascular: No hyperdense vessel or unexpected calcification. Skull: Normal. Negative for fracture or focal lesion. Sinuses/Orbits: The visualized paranasal sinuses are clear. There is mild bilateral mastoid effusion. Other: None IMPRESSION: 1. No acute intracranial pathology. 2. Mild age-related atrophy and chronic microvascular ischemic changes. Electronically Signed   By: Anner Crete M.D.   On: 01/06/2017 00:12   Dg Chest Port 1 View  Result Date: 01/06/2017 CLINICAL DATA:  Acute onset of generalized weakness and syncope. EXAM: PORTABLE CHEST 1 VIEW COMPARISON:  Chest radiograph and CTA of the chest performed 11/03/2016 FINDINGS: The lungs are hypoexpanded. Mild retrocardiac airspace opacity could reflect pneumonia. No pleural effusion or pneumothorax is seen. The cardiomediastinal silhouette is mildly enlarged. No acute osseous abnormalities are identified. IMPRESSION: 1. Lungs hypoexpanded. Mild retrocardiac airspace opacity could reflect pneumonia. 2. Mild cardiomegaly. Electronically Signed   By: Garald Balding M.D.   On: 01/06/2017 00:46   US Abdomen Limited Ruq  Result Date: 01/06/2017 CLINICAL DATA:  Acute onset  of right upper quadrant abdominal pain that began yesterday. Patient had an indwelling cholecystostomy tube from April through July, 2018. Elevated serum alkaline phosphatase levels currently. EXAM: ULTRASOUND ABDOMEN LIMITED RIGHT UPPER QUADRANT COMPARISON:  Visualized upper abdomen on CTA chest 11/03/2016 and 10/07/2016. Urinary tract ultrasound 09/18/2016. No prior right upper quadrant abdominal ultrasound. FINDINGS: Gallbladder: Contracted containing multiple shadowing gallstones, the largest on the order of 6 mm in size. Mild wall thickening up to 4 mm. No pericholecystic fluid. Negative sonographic Murphy's sign according to the ultrasound technologist. Common bile duct: Diameter: Approximately 5 mm. The distal common bile duct is obscured by duodenal bowel gas. Liver: Diffusely increased and coarsened echotexture without focal hepatic parenchymal abnormality. Portal vein is patent on color Doppler imaging with normal direction of blood flow towards the liver. IMPRESSION: 1. Cholelithiasis. Contracted gallbladder which accounts for mild wall thickening. No convincing sonographic evidence of  acute cholecystitis. 2. No biliary ductal dilation. 3. Diffuse hepatic steatosis and/or hepatocellular disease without focal hepatic parenchymal abnormality. Electronically Signed   By: Evangeline Dakin M.D.   On: 01/06/2017 09:24    Results/Tests Pending at Time of Discharge:  Unresulted Labs (From admission, onward)   Start     Ordered   01/07/17 0500  CBC  Daily,   R     01/06/17 0843   01/07/17 2423  Basic metabolic panel  Daily,   R     01/06/17 0843   01/06/17 0418  Urine rapid drug screen (hosp performed)  STAT,   R     01/06/17 0417   01/05/17 2327  Urinalysis, Routine w reflex microscopic  STAT,   STAT     01/05/17 2327       Discharge Medications:  Allergies as of 01/07/2017      Reactions   Hydrocodone-acetaminophen Shortness Of Breath   Hydroxyzine Anaphylaxis, Shortness Of Breath   Latuda  [lurasidone Hcl] Anaphylaxis   Codeine Nausea And Vomiting   Sulfa Antibiotics Itching      Medication List    STOP taking these medications   metolazone 2.5 MG tablet Commonly known as:  ZAROXOLYN     TAKE these medications   albuterol (2.5 MG/3ML) 0.083% nebulizer solution Commonly known as:  PROVENTIL Take 3 mLs (2.5 mg total) every 6 (six) hours as needed by nebulization for wheezing or shortness of breath.   albuterol 108 (90 Base) MCG/ACT inhaler Commonly known as:  PROVENTIL HFA;VENTOLIN HFA Inhale 2 puffs every 6 (six) hours as needed into the lungs for wheezing or shortness of breath.   amitriptyline 10 MG tablet Commonly known as:  ELAVIL Take 1 tablet (10 mg total) by mouth at bedtime.   amLODipine 5 MG tablet Commonly known as:  NORVASC Take 1 tablet (5 mg total) by mouth daily.   ARIPiprazole 5 MG tablet Commonly known as:  ABILIFY Take1 tab adily   aspirin 81 MG EC tablet Take 1 tablet (81 mg total) by mouth daily. Swallow whole.   atorvastatin 80 MG tablet Commonly known as:  LIPITOR Take 1 tablet (80 mg total) by mouth daily.   benztropine 0.5 MG tablet Commonly known as:  COGENTIN Take 1 tablet (0.5 mg total) by mouth 2 (two) times daily morning and evening   BREO ELLIPTA 100-25 MCG/INH Aepb Generic drug:  fluticasone furoate-vilanterol INHALE 1 PUFF EVERY DAY   Diclofenac Sodium 3 % Gel Use on area of the pain   escitalopram 20 MG tablet Commonly known as:  LEXAPRO Take 1 tablet (20 mg total) by mouth daily.   flunisolide 25 MCG/ACT (0.025%) Soln Commonly known as:  NASALIDE Place 2 sprays into the nose daily.   gabapentin 600 MG tablet Commonly known as:  NEURONTIN Take 1 tablet (600 mg total) by mouth 3 (three) times daily.   guaiFENesin 600 MG 12 hr tablet Commonly known as:  MUCINEX Take 1 tablet (600 mg total) by mouth 2 (two) times daily.   metFORMIN 500 MG 24 hr tablet Commonly known as:  GLUCOPHAGE-XR Take 1 tablet (500 mg  total) by mouth 2 (two) times daily.   mirtazapine 45 MG disintegrating tablet Commonly known as:  REMERON SOL-TAB Take 1 tablet (45 mg total) by mouth at bedtime.   montelukast 10 MG tablet Commonly known as:  SINGULAIR Take 1 tablet (10 mg total) by mouth at bedtime.   multivitamin with minerals Tabs tablet Take 1 tablet by  mouth daily.   omeprazole 40 MG capsule Commonly known as:  PRILOSEC Take 1 capsule (40 mg total) by mouth 2 (two) times daily. in the morning and evebing   OXYGEN Inhale 6 L into the lungs daily.   potassium chloride 10 MEQ tablet Commonly known as:  K-DUR Take 6 tablets (60 mEq total) by mouth 2 (two) times daily.   tiotropium 18 MCG inhalation capsule Commonly known as:  SPIRIVA HANDIHALER Place 1 capsule (18 mcg total) into inhaler and inhale daily.   torsemide 20 MG tablet Commonly known as:  DEMADEX Take 4 tablets (80 mg total) by mouth 2 (two) times daily.   Trolamine Salicylate 10 % Lotn Apply 1 patch topically 3 (three) times daily as needed.       Discharge Instructions: Please refer to Patient Instructions section of EMR for full details.  Patient was counseled important signs and symptoms that should prompt return to medical care, changes in medications, dietary instructions, activity restrictions, and follow up appointments.   Follow-Up Appointments: Follow-up Information    Lovenia Kim, MD. Go on 01/10/2017.   Specialty:  Family Medicine Why:  @ 4pm (please arrive at least 15 min early) Contact information: [place] [address] [phone]           Sherene Sires, DO 01/07/2017, 3:56 PM PGY-1, [place]

## 2017-01-06 NOTE — Progress Notes (Signed)
Family Medicine Teaching Service Daily Progress Note Intern Pager: [phone]  Patient name: Tiffany [name] Medical record number: [id number] Date of birth: [birth year] Age: 56 y.o. Gender: female  Primary Care Provider: [name], MD Consultants: None Code Status: Full   Pt Overview and Major Events to Date:  Admitted to [facility] on 01/06/17 for syncopal work up   Assessment and Plan: Tiffany Klutts McElrathis a 56 y.o.femalepresenting with syncopal episode this morning. PMH is significant for asthma/COPD (6L home O2 daytime,trilogy ventQHS), CKDII, T2DM, HFpEF, HLD, HTN, Bipolar 1 disorder, paranoid schizophrenia, Hx of cocaine abuse  Syncope Unclear etiology, likely multifactorial related to deconditioning, medication management, and electrolyte imbalance. Likely patient was overdiuresis with torsemide, causing orthostasis and hypokalemia. EKGs showing prolonged QT, similar to previous findings. Risk stratification labs showing A1C of 6.0, and lipid panel showing decreased HDL of 35 and elevated triglycerides to 188.  -obtain orthostatic vitals  -neuro checks q4hrs  -can consider neurology/cardiology consult if not improving with electrolyte repletion and if orthostatic vitals are negative  -UA pending -UDS pending  -PT/OT eval - recommend home health PT  Repetitive speech Improving. No repetitive speech on exam this morning. Stroke and hemorrhaging r/o with CT scan -psych consulted, recommend continuing current psych regimen: Abilify 5 mg daily, Cogentin 0.5 mg twice a day, Lexapro 20 mg daily and Remeron 45 mg at bedtime -consider MRI if not improving  Hypokalemia Labs not drawn this am despite having order in. Last K 2.7, but repleted with K .   -continue to monitor on daily BMET -replete as needed with KDur  Hypomagnesemia-resolved Last Mg 2.2 on 12/22, improved from 1.5 on admission. -monitor daily -replete as needed with 2g IV Mg  Abdominal pain Patient with  known history of cholecystitis. Patient had malpositioned cholecystostomy tube removed in July and was told that she could have recurrent acute cholecystitis. RUQ ultrasound showing cholelithiasis with mild wall thickening, no evidence of acute cholecystitis or biliary duct dilation.  -monitor on daily abdominal exams -strict Is and Os  Anemia Last Hgb 9.1 on 12/22. Appears to be chronically low with baseline ~8.8.   -monitor on daily CBC  CKD stage 3 Cr of 1.54, GFR of 42, BUN 21 on 12/22. Baseline Cr ~1.7, baseline GFR ~ 36.  -continue to monitor on daily BMET  HFpEF Last echo 10/30/16 with EF 60-65%.Home meds:torsemide80mg  bid,metolazone2.5mg  once a week.   -Continue home torsemide -Hold home metolazone -Holding IVF, can consider discontinuing torsemide and starting fluids if appearing more dry  -daily weights -strict I's and O's  COPD Fairly well-controlled on current regimen. Satting in mid 90s on home 6LO2. Home meds: albuterol prn, breo, singulair 10mg , spirivadaily  -Cotinue home Spiriva, Breo,Singulair -Albuterol PRN -Continue home O2 -Pulse ox with vitals -CPAP qhs as patient is on home trilogy vent at home  -incentive spirometry q4hrs while awake  HTN Hypotensive, BP 117/93.Home meds: amlodipine 5mg  torsemide 80mg  bid -will hold home amlodipine, can consider restarting when BP stabilizes  Type II DM A1C of 6.0. Home meds:metformin 500mg  BID, gabapentin 600mg  tid.AM CBG of 148 -Hold metformin -Continue home gabapentin for neuropathy -CBG qACHS -sensitiveSSI  OSA -CPAP qhs, patient on home trilogy vent  Bipolardisorder Home meds: amitriptyline 10mg , abilify 5mg , lexapro 20mg , mirtazapine 45mg   -Continue homemed -psych consulted, recommend continuing home meds and outpatient follow up   HLD: Home meds: atorvastatin 80mg  daily -Continue homemeds  FEN/GI:heart healthy, carb modified Prophylaxis:heparin  Disposition:  will be discharged home with home health  Subjective:  Patient today with no complaints. Denies pain, SOB, or chest pain. Has not gotten out of bed so unable to assess dizziness. Patient today asking when she can go home.   Objective: Temp:  [97.8 F (36.6 C)-98.3 F (36.8 C)] 98.3 F (36.8 C) (12/23 0600) Pulse Rate:  [80-84] 84 (12/23 0600) BP: (110-120)/(69-93) 120/80 (12/23 0600) SpO2:  [97 %-100 %] 99 % (12/23 0831) Weight:  [279 lb 15.8 oz (127 kg)] 279 lb 15.8 oz (127 kg) (12/23 0600) Physical Exam: General: awake and alert, laying in bed, CPAP in place  Cardiovascular: RRR, no MRG Respiratory: CTAB, no wheezes, rales, or rhonchi  Abdomen: soft, non tender, non distended, bowel sounds normal  Extremities: tenderness of RLE, 3/5 muscle strength of RLE, 4/5 strength in LLE, normal grip strength  Neuro: no focal deficits, PERRL, EOMI   Laboratory: Recent Labs  Lab 01/06/17 0046 01/06/17 0508  WBC 12.1* 12.2*  HGB 9.9* 9.1*  HCT 32.9* 30.5*  PLT 439* 436*   Recent Labs  Lab 01/06/17 0046 01/06/17 0508 01/06/17 2139  NA 140 141 139  K 2.6* 2.6* 2.7*  CL 91* 93* 91*  CO2 35* 38* 37*  BUN 25* 24* 21*  CREATININE 1.65* 1.61* 1.54*  CALCIUM 9.1 8.7* 9.2  PROT 7.0 7.0  --   BILITOT 0.4 0.8  --   ALKPHOS 168* 158*  --   ALT 16 16  --   AST 21 18  --   GLUCOSE 96 100* 134*     Ref. Range 01/07/2017 00:05  Troponin I Latest Ref Range: <0.03 ng/mL <0.03    Imaging/Diagnostic Tests: Ct Head Wo Contrast  Result Date: 01/06/2017 CLINICAL DATA:  56 year old female with syncope. EXAM: CT HEAD WITHOUT CONTRAST TECHNIQUE: Contiguous axial images were obtained from the base of the skull through the vertex without intravenous contrast. COMPARISON:  Head CT dated 06/12/2016 FINDINGS: Brain: Mild bifrontal atrophy as well as chronic microvascular ischemic changes. There is no acute intracranial hemorrhage. No mass effect or midline shift. No extra-axial fluid collection.  Probably partially empty sella. Vascular: No hyperdense vessel or unexpected calcification. Skull: Normal. Negative for fracture or focal lesion. Sinuses/Orbits: The visualized paranasal sinuses are clear. There is mild bilateral mastoid effusion. Other: None IMPRESSION: 1. No acute intracranial pathology. 2. Mild age-related atrophy and chronic microvascular ischemic changes. Electronically Signed   By: Anner Crete M.D.   On: 01/06/2017 00:12   Dg Chest Port 1 View  Result Date: 01/06/2017 CLINICAL DATA:  Acute onset of generalized weakness and syncope. EXAM: PORTABLE CHEST 1 VIEW COMPARISON:  Chest radiograph and CTA of the chest performed 11/03/2016 FINDINGS: The lungs are hypoexpanded. Mild retrocardiac airspace opacity could reflect pneumonia. No pleural effusion or pneumothorax is seen. The cardiomediastinal silhouette is mildly enlarged. No acute osseous abnormalities are identified. IMPRESSION: 1. Lungs hypoexpanded. Mild retrocardiac airspace opacity could reflect pneumonia. 2. Mild cardiomegaly. Electronically Signed   By: Garald Balding M.D.   On: 01/06/2017 00:46   US Abdomen Limited Ruq  Result Date: 01/06/2017 CLINICAL DATA:  Acute onset of right upper quadrant abdominal pain that began yesterday. Patient had an indwelling cholecystostomy tube from April through July, 2018. Elevated serum alkaline phosphatase levels currently. EXAM: ULTRASOUND ABDOMEN LIMITED RIGHT UPPER QUADRANT COMPARISON:  Visualized upper abdomen on CTA chest 11/03/2016 and 10/07/2016. Urinary tract ultrasound 09/18/2016. No prior right upper quadrant abdominal ultrasound. FINDINGS: Gallbladder: Contracted containing multiple shadowing gallstones, the largest on the order of 6 mm in size.  Mild wall thickening up to 4 mm. No pericholecystic fluid. Negative sonographic Murphy's sign according to the ultrasound technologist. Common bile duct: Diameter: Approximately 5 mm. The distal common bile duct is obscured by  duodenal bowel gas. Liver: Diffusely increased and coarsened echotexture without focal hepatic parenchymal abnormality. Portal vein is patent on color Doppler imaging with normal direction of blood flow towards the liver. IMPRESSION: 1. Cholelithiasis. Contracted gallbladder which accounts for mild wall thickening. No convincing sonographic evidence of acute cholecystitis. 2. No biliary ductal dilation. 3. Diffuse hepatic steatosis and/or hepatocellular disease without focal hepatic parenchymal abnormality. Electronically Signed   By: Evangeline Dakin M.D.   On: 01/06/2017 09:24     Caroline More, DO 01/07/2017, 8:34 AM PGY-1, [place] Intern pager: [phone], text pages welcome

## 2017-01-06 NOTE — ED Notes (Signed)
Delay in lab draw,  Xray in room.

## 2017-01-06 NOTE — H&P (Signed)
[facility] Admission History and Physical Service Pager: [phone]  Patient name: Tiffany [name] Medical record number: [id number] Date of birth: 02-18-1960 Age: 56 y.o. Gender: female  Primary Care Provider: [name], MD Consultants: none Code Status: full   Chief Complaint: syncope   Assessment and Plan: Tiffany [name] is a 56 y.o. female presenting with syncopal episode this morning . PMH is significant for asthma/COPD (6L home O2 daytime, trilogy ventQHS), CKDIII, T2DM, HFpEF, HLD, HTN, Bipolar 1 disorder, paranoid schizophrenia, Hx of cocaine abuse  Syncope Unwitness syncopal episode. Unclear how long LOC. Possible orthostatic hypotension as patient recently got up from bed and BP of 102/79 on presentation to ED. Also dry MM on exam; and by report may not have regular meals. Possibly 2/2 electrolyte imbalance given hypokalemia and hypomagnesemia. Unlikely seizure activity given lack of seizure PMHx (pseudoseizure noted in chart) and no incontinence, but patient does report being groggy following LOC. Less likely cardiac given negative istat troponin and no ischemic changes or arrhythmia on EKG. Possibly 2/2 to medications as patient controls her own medications, but less likely as patient uses "pill packs" measured by pharmacy. Possible hypoglycemia given glucose of 96 on admission but only on metformin at home. CT on admission showing no acute intracranial pathology so unlikely stroke. CXR on admission showing possible pneumonia but lungs CTAB and afebrile so less likely pneumonia. Slight leukocytosis, 12.1, and lactic acid trended at 2.17>1.35 so cannot rule out infectious etiology.  -admit to telemetry, attending Dr. Gwendlyn Deutscher -continuous cardiac monitoring -continuous pulse ox  -obtain orthostatic vitals  -neuro checks q4hrs  -can consider neurology/cardiology consult if not improving with electrolyte repletion and if orthostatic vitals are  negative  -CK -am CBC, CMP, and Mg -ABG -UA -UDS -ethanol level  -am EKG  -PT/OT eval  -vital signs q4hrs   Repetitive speech: Speech coherent just with occasional pauses and repetitive phrasing; also with voluntary shaking of feet and hands. CT head negative for acute intracranial pathology. Will touch base with PCP about baseline.   Hypokalemia K of 2.6 on admission. Given IV K while in ED.  -continue to monitor on daily BMET -replete as needed with either IV K or KDur   Hypomagnesemia  Mg of 1.5 on admission. Given IV Mg while in ED -monitor daily -replete as needed with 2g IV Mg  Abdominal pain Patient endorsing diffuse abdominal pain, RUQ and RLQ worse. +rebound tenderness. Patient with known history of cholecystitis. Patient had malpositioned cholecystostomy tube removed in July and was told that she could have recurrent acute cholecystitis.  -consider imaging if worsening -monitor on daily abdominal exams -I/Os  Anemia Hgb of 9.9 on admission. Appears to be chronically low with baseline ~8.8. MCV of 94.  -monitor on daily BMET  CKD stage 3 Cr of 1.65, GFR of 39, BUN 25. Baseline Cr ~1.7, baseline GFR ~ 36.  -continue to monitor on daily BMET   HFpEF Last echo 10/30/16 with EF 60-65%. Home meds: torsemide 80mg  bid, metolazone 2.5mg  once a week. Appears dry on exam. Weight of 277 lbs on admission compared to 295 when last discharged from hospital in October and down from 285 a few days ago at nutrition appointment. - Continue home torsemide   - Hold home metolazone - Holding IVF, can consider discontinuing torsemide and starting fluids if appearing more dry  - daily weights - strict I's and O's   COPD Fairly well-controlled on current regimen. Satting well on 5L O2,  home 6L O2 requirement. Home meds: albuterol prn, breo, singulair 10mg , spiriva  - [place] home Spiriva, Breo, Singulair - Albuterol PRN - Continue home O2 - Pulse ox with vitals - CPAP/bipap qhs  as patient is on home trilogy vent at home  - incentive spirometry q4hrs while awake   HTN Hypotensive, BP 102/79, on admission. Home meds: amlodipine 5mg  torsemide 80mg  bid  - will hold home amlodipine, can consider restarting when BP stabilizes   Type II DM Last A1C 5.5 on 11/03/2016. Home meds: metformin 500mg  BID, gabapentin 600mg  tid. Glucose of 96 on admission.  - Hold metformin - Continue home gabapentin for neuropathy  - CBG qACHS - sensitive SSI  OSA:  - CPAP/bipap qhs, patient on home trilogy vent   Bipolar disorder:  Home meds: amitriptyline, abilify 5mg , lexapro, mirtazapine - Continue home med - monitor QTc periodically with EKG  HLD: Home meds: atorvastatin 80mg  daily  - Continue home meds   FEN/GI: heart healthy, carb modified  Prophylaxis: heparin   Disposition: admit to inpatient, attending Dr. Gwendlyn Deutscher   History of Present Illness:  Tiffany [name] is a 56 y.o. female presenting with unwitnessed syncopal episode occurring this morning. Patient states she passed out while on the way to the bathroom this morning. Patient remembers getting out of bed and then walking to bathroom. Patient was not sitting on toilet or getting up from toilet. Patient does report coughing spell prior to LOC. Denies chest pain or increased SOB during episode. Does report feeling "groggy" afterwards but denies bowel/bladder incontinence or known shaking. States hands did tremble afterwards. Patient states this has happened before and says this happened with LOC with legs giving out. Patient denies any medication changes, alcohol, or illicit drug use.   In ED patient was found to have low K and Mg, which were both repleted with IV K and Mg respectively. During exam patient had repetitive speech "they've been monitoring me" at doctor. Patient slow to answer questioning.   Review Of Systems: Per HPI with the following additions:   Review of Systems  Constitutional: Negative for chills  and fever.  HENT: Negative for hearing loss and sinus pain.   Respiratory: Positive for cough. Negative for shortness of breath.   Cardiovascular: Negative for chest pain and palpitations.  Gastrointestinal: Negative for abdominal pain, diarrhea, nausea and vomiting.  Genitourinary: Negative for dysuria and urgency.  Musculoskeletal: Positive for falls.  Skin: Negative for rash.    Patient Active Problem List   Diagnosis Date Noted  . Syncope 01/06/2017  . CKD (chronic kidney disease) 12/15/2016  . Carotid artery stenosis   . Right foot pain 11/15/2016  . Right carotid bruit 11/09/2016  . Hypokalemia   . Osteoarthritis 10/26/2016  . Dyspnea 10/07/2016  . Cardiac arrest ([place]) 09/08/2016  . History of open reduction and internal fixation (ORIF) procedure   . Closed displaced transverse fracture of shaft of right tibia   . Fall   . Overactive bladder 06/07/2016  . QT prolongation   . OSA and COPD overlap syndrome ([place])   . Generalized weakness   . Arthritis   . Bipolar I disorder ([place])   . Essential hypertension 03/22/2016  . Bipolar affective disorder, mixed, severe, with psychotic behavior ([place]) 11/28/2015  . Cocaine use disorder, severe, dependence ([place]) 11/28/2015  . Chronic diastolic congestive heart failure ([place])   . Chronic respiratory failure with hypoxia and hypercapnia ([place]) 06/22/2015  . Onychomycosis 01/21/2015  . Nasal congestion 12/17/2014  . Tobacco  use disorder 07/22/2014  . COPD (chronic obstructive pulmonary disease) ([place]) 07/08/2014  . Knee pain, bilateral 01/22/2013  . Seizure ([place]) 01/04/2013  . Health care maintenance 11/25/2012  . History of kidney stones 06/18/2012  . Chronic pain syndrome 06/18/2012  . Allergic reaction 04/07/2012  . HSV infection 08/30/2011  . Dyslipidemia 04/24/2011  . Anemia 04/24/2011  . Diabetic neuropathy ([place]) 04/24/2011  . Obstructive sleep apnea 10/18/2010  . Asthma 10/18/2010  . Morbid obesity ([place]) 10/18/2010  .  Type 2 diabetes mellitus ([place]) 10/18/2010    Past Medical History: Past Medical History:  Diagnosis Date  . Anxiety   . Arthritis    "all over" (04/10/2016)  . Asthma   . Cardiac arrest ([place]) 09/08/2016   PEA  . Carotid artery stenosis    1-39% bilateral by dopplers 11/2016  . Chronic bronchitis ([place])   . Chronic diastolic (congestive) heart failure ([place])   . Chronic kidney disease    "I see a kidney dr." (04/10/2016)  . Cocaine abuse ([place])   . Complication of anesthesia    decreased bp, decreased heart rate  . Depression   . Disorder of nervous system   . Emphysema   . GERD (gastroesophageal reflux disease)   . Heart attack ([place]) 1980s  . History of blood transfusion 1994   "couldn't stop bleeding from my period"  . Hyperlipidemia LDL goal <70   . Hypertension   . Incontinence   . Manic depression ([place])   . On home oxygen therapy    "6L; 24/7" (04/10/2016)  . OSA on CPAP    "wear mask sometimes" (04/10/2016)  . Paranoid ([place])    "sometimes; I'm on RX for it" (04/10/2016)  . Pneumonia    "I've had it several times; haven't had it since 06/2015" (04/10/2016)  . Schizophrenia ([place])   . Seasonal allergies   . Seizures ([place])    "don't know what kind; last one was ~ 1 yr ago" (04/10/2016)  . Sinus trouble   . Stroke [facility]) 1980s   denies residual on 04/10/2016  . Type II diabetes mellitus ([place])     Past Surgical History: Past Surgical History:  Procedure Laterality Date  . CESAREAN SECTION  1997  . HERNIA REPAIR    . IR CHOLANGIOGRAM EXISTING TUBE  07/20/2016  . IR PERC CHOLECYSTOSTOMY  05/10/2016  . IR RADIOLOGIST EVAL & MGMT  06/08/2016  . IR RADIOLOGIST EVAL & MGMT  06/29/2016  . IR SINUS/FIST TUBE CHK-NON GI  07/12/2016  . TIBIA IM NAIL INSERTION Right 07/12/2016   Procedure: INTRAMEDULLARY (IM) NAIL RIGHT TIBIA;  Surgeon: Leandrew Koyanagi, MD;  Location: [facility];  Service: Orthopedics;  Laterality: Right;  . UMBILICAL HERNIA REPAIR  ~ 1963   "that's why I don't have a belly  button"  . VAGINAL HYSTERECTOMY      Social History: Social History   Tobacco Use  . Smoking status: Former Smoker    Packs/day: 1.50    Years: 38.00    Pack years: 57.00    Types: Cigarettes    Start date: 03/13/1977    Last attempt to quit: 04/10/2016    Years since quitting: 0.7  . Smokeless tobacco: Never Used  Substance Use Topics  . Alcohol use: No    Alcohol/week: 0.0 oz  . Drug use: No    Comment: 04/10/2016 "last used cocaine back in November 2017"   Additional social history: lives with her son and his friend. Last cocaine use over year  ago. Denies alcohol use. Denies tobacco use.  Please also refer to relevant sections of EMR.  Family History: Family History  Problem Relation Age of Onset  . Cancer Father        prostate  . Cancer Mother        lung  . Depression Mother   . Depression Sister   . Anxiety disorder Sister   . Schizophrenia Sister   . Bipolar disorder Sister   . Depression Sister   . Depression Brother   . Heart failure Unknown        cousin    Allergies and Medications: Allergies  Allergen Reactions  . Hydrocodone-Acetaminophen Shortness Of Breath  . Hydroxyzine Anaphylaxis and Shortness Of Breath  . Latuda [Lurasidone Hcl] Anaphylaxis  . Codeine Nausea And Vomiting  . Sulfa Antibiotics Itching   No current facility-administered medications on file prior to encounter.    Current Outpatient Medications on File Prior to Encounter  Medication Sig Dispense Refill  . albuterol (PROVENTIL HFA;VENTOLIN HFA) 108 (90 Base) MCG/ACT inhaler Inhale 2 puffs every 6 (six) hours as needed into the lungs for wheezing or shortness of breath. 18 Inhaler 0  . albuterol (PROVENTIL) (2.5 MG/3ML) 0.083% nebulizer solution Take 3 mLs (2.5 mg total) every 6 (six) hours as needed by nebulization for wheezing or shortness of breath. 150 mL 1  . amitriptyline (ELAVIL) 10 MG tablet Take 1 tablet (10 mg total) by mouth at bedtime. 30 tablet 0  . amLODipine  (NORVASC) 5 MG tablet Take 1 tablet (5 mg total) by mouth daily. 90 tablet 0  . ARIPiprazole (ABILIFY) 5 MG tablet Take1 tab adily 30 tablet 1  . aspirin 81 MG EC tablet Take 1 tablet (81 mg total) by mouth daily. Swallow whole. 90 tablet 0  . atorvastatin (LIPITOR) 80 MG tablet Take 1 tablet (80 mg total) by mouth daily. 60 tablet 3  . benztropine (COGENTIN) 0.5 MG tablet Take 1 tablet (0.5 mg total) by mouth 2 (two) times daily morning and evening 60 tablet 0  . BREO ELLIPTA 100-25 MCG/INH AEPB INHALE 1 PUFF EVERY DAY 60 each 2  . Diclofenac Sodium 3 % GEL Use on area of the pain 100 g 0  . escitalopram (LEXAPRO) 20 MG tablet Take 1 tablet (20 mg total) by mouth daily. 30 tablet 1  . flunisolide (NASALIDE) 25 MCG/ACT (0.025%) SOLN Place 2 sprays into the nose daily. 1 Bottle 2  . gabapentin (NEURONTIN) 600 MG tablet Take 1 tablet (600 mg total) by mouth 3 (three) times daily. 270 tablet 0  . guaiFENesin (MUCINEX) 600 MG 12 hr tablet Take 1 tablet (600 mg total) by mouth 2 (two) times daily. 30 tablet 0  . metFORMIN (GLUCOPHAGE-XR) 500 MG 24 hr tablet Take 1 tablet (500 mg total) by mouth 2 (two) times daily. 180 tablet 0  . metolazone (ZAROXOLYN) 2.5 MG tablet Take 2.5 mg by mouth once a week.     . mirtazapine (REMERON SOL-TAB) 45 MG disintegrating tablet Take 1 tablet (45 mg total) by mouth at bedtime. 30 tablet 1  . montelukast (SINGULAIR) 10 MG tablet Take 1 tablet (10 mg total) by mouth at bedtime. 90 tablet 1  . Multiple Vitamin (MULTIVITAMIN WITH MINERALS) TABS tablet Take 1 tablet by mouth daily.    [name] omeprazole (PRILOSEC) 40 MG capsule Take 1 capsule (40 mg total) by mouth 2 (two) times daily. in the morning and evebing 56 capsule 2  . OXYGEN Inhale 6 L into  the lungs daily.    . potassium chloride (K-DUR) 10 MEQ tablet Take 6 tablets (60 mEq total) by mouth 2 (two) times daily. 360 tablet 6  . tiotropium (SPIRIVA HANDIHALER) 18 MCG inhalation capsule Place 1 capsule (18 mcg total) into  inhaler and inhale daily. 90 capsule 1  . torsemide (DEMADEX) 20 MG tablet Take 4 tablets (80 mg total) by mouth 2 (two) times daily. 720 tablet 3  . Trolamine Salicylate 10 % LOTN Apply 1 patch topically 3 (three) times daily as needed. 141 g 2    Objective: BP 132/89 (BP Location: Left Arm)   Pulse 91   Temp 99.2 F (37.3 C) (Oral)   Resp 18   Wt 277 lb 5.4 oz (125.8 kg)   SpO2 99%   BMI 50.73 kg/m  Exam: General: awake and alert, repetitive and slowed speech, [place] in place  Eyes: PERRL, EOMI  ENTM: dry mucous membranes, wide neck circumference  Cardiovascular: RRR, no MRG Respiratory: CTAB (difficulty exam due to body habitus), no wheezes, rales, or rhonchi  Gastrointestinal: diffuse tenderness but most tender in RUQ and RLQ, +rebound tenderness, no guarding, soft, non distended, bowel sounds normal  MSK: non tender, 3/5 muscle strength in LLE, 4/5 muscle strength in RLE, diminished grip strength on left side, no edema  Derm: intact, no rashes, warm, dry  Neuro: AAx2 (person and place but not to time), CN2-12 intact, sensation intact bilaterally  Psych: repetitive and slowed speech   Labs and Imaging: CBC BMET  Recent Labs  Lab 01/06/17 0046  WBC 12.1*  HGB 9.9*  HCT 32.9*  PLT 439*   Recent Labs  Lab 01/06/17 0046  NA 140  K 2.6*  CL 91*  CO2 35*  BUN 25*  CREATININE 1.65*  GLUCOSE 96  CALCIUM 9.1      Ref. Range 01/06/2017 00:39 01/06/2017 00:46 01/06/2017 03:22  Lactic Acid, Venous Latest Ref Range: 0.5 - 1.9 mmol/L 2.17 (HH)  1.35   Ct Head Wo Contrast  Result Date: 01/06/2017 CLINICAL DATA:  56 year old female with syncope. EXAM: CT HEAD WITHOUT CONTRAST TECHNIQUE: Contiguous axial images were obtained from the base of the skull through the vertex without intravenous contrast. COMPARISON:  Head CT dated 06/12/2016 FINDINGS: Brain: Mild bifrontal atrophy as well as chronic microvascular ischemic changes. There is no acute intracranial hemorrhage. No mass  effect or midline shift. No extra-axial fluid collection. Probably partially empty sella. Vascular: No hyperdense vessel or unexpected calcification. Skull: Normal. Negative for fracture or focal lesion. Sinuses/Orbits: The visualized paranasal sinuses are clear. There is mild bilateral mastoid effusion. Other: None IMPRESSION: 1. No acute intracranial pathology. 2. Mild age-related atrophy and chronic microvascular ischemic changes. Electronically Signed   By: Anner Crete M.D.   On: 01/06/2017 00:12   Dg Chest Port 1 View  Result Date: 01/06/2017 CLINICAL DATA:  Acute onset of generalized weakness and syncope. EXAM: PORTABLE CHEST 1 VIEW COMPARISON:  Chest radiograph and CTA of the chest performed 11/03/2016 FINDINGS: The lungs are hypoexpanded. Mild retrocardiac airspace opacity could reflect pneumonia. No pleural effusion or pneumothorax is seen. The cardiomediastinal silhouette is mildly enlarged. No acute osseous abnormalities are identified. IMPRESSION: 1. Lungs hypoexpanded. Mild retrocardiac airspace opacity could reflect pneumonia. 2. Mild cardiomegaly. Electronically Signed   By: Garald Balding M.D.   On: 01/06/2017 00:46    Caroline More, DO 01/06/2017, 4:27 AM PGY-1, [facility] pager: [phone], text pages welcome  Upper Level Addendum:  I have  seen and evaluated this patient along with Dr. Tammi Klippel and reviewed the above note, making necessary revisions in green.  Rogue Bussing, MD PGY-3,  [facility] 01/06/2017 6:54 AM

## 2017-01-06 NOTE — Progress Notes (Signed)
Family Medicine Teaching Service Daily Progress Note Intern Pager: [phone]  Patient name: Tiffany [name] Medical record number: [id number] Date of birth: Mar 23, 1960 Age: 56 y.o. Gender: female  Primary Care Provider: [name], MD Consultants: none Code Status: full   Pt Overview and Major Events to Date:  Admitted to [place] on 01/06/17 for syncopal work up  Assessment and Plan: Tiffany [name] is a 56 y.o. female presenting with syncopal episode this morning . PMH is significant for asthma/COPD (6L home O2 daytime, trilogy ventQHS), CKDII, T2DM, HFpEF, HLD, HTN, Bipolar 1 disorder, paranoid schizophrenia, Hx of cocaine abuse  Syncope Unclear etiology. Possible orthostatic hypotension as patient recently got up from bed and hypotension of 102/79 on presentation to ED. Possibly 2/2 electrolyte imbalance given hypokalemia and hypomagnesemia. Possible hypoglycemia given glucose of 96 on admission, but only on metformin at home. CK wnl. ABG showing pH 7.466, pCO2 54.5, pO2 141, and bicarb 38.8. Cannot rule out psychiatric etiology given patient's history of pseudoseizures and psych history. Ethanol wnl.  -obtain orthostatic vitals  -neuro checks q4hrs  -psych consultation  -can consider neurology/cardiology consult if not improving with electrolyte repletion and if orthostatic vitals are negative  -UA pending -UDS pending  -am EKG  -PT/OT eval   Repetitive speech Speech coherent just with occasional pauses and repetitive phrasing; also with voluntary shaking of feet and hands. CT head negative for acute intracranial pathology. Will touch base with PCP about baseline.  -psych consult   Hypokalemia Current K 2.6, unchanged from 2.6 on admission.   -continue to monitor on daily BMET -replete as needed with KDur  Hypomagnesemia-resolved Current Mg 2.2, improved from 1.5 on admission. -monitor daily -replete as needed with 2g IV Mg  Abdominal pain Patient endorsing  diffuse abdominal pain, RUQ and RLQ worse. +rebound tenderness. Patient with known history of cholecystitis. Patient had malpositioned cholecystostomy tube removed in July and was told that she could have recurrent acute cholecystitis.  -consider imaging if worsening -monitor on daily abdominal exams -strict Is and Os -will obtain RUQ ultrasound  Anemia Current Hgb of 9.1. Appears to be chronically low with baseline ~8.8.   -monitor on daily CBC  CKD stage 3 Cr of 1.61, GFR of 40, BUN 24. Baseline Cr ~1.7, baseline GFR ~ 36.  -continue to monitor on daily BMET   HFpEF Last echo 10/30/16 with EF 60-65%. Home meds: torsemide 80mg  bid, metolazone 2.5mg  once a week.   - Continue home torsemide - Hold home metolazone - Holding IVF, can consider discontinuing torsemide and starting fluids if appearing more dry  - daily weights - strict I's and O's   COPD Fairly well-controlled on current regimen. Satting in mid 90s on home 6L O2. Home meds: albuterol prn, breo, singulair 10mg , spiriva  - Cotinue home Spiriva, Breo,Singulair - Albuterol PRN - Continue home O2 - Pulse ox with vitals - CPAP/bipap qhs as patient is on home trilogy vent at home  - incentive spirometry q4hrs while awake   HTN Normotensive, BP 132/89. Home meds: amlodipine 5mg  torsemide 80mg  bid  - will hold home amlodipine, can consider restarting when BP stabilizes   Type II DM Last A1C 5.5 on 11/03/2016. Home meds: metformin 500mg  BID, gabapentin 600mg  tid. AM CBG of 86 - Hold metformin - Continue home gabapentin for neuropathy  - CBG qACHS - sensitive SSI  OSA:  - CPAP/bipap qhs, patient on home trilogy vent   Bipolardisorder:  Home meds: amitriptyline, abilify 5mg , lexapro, mirtazapine - Continue  home med  HLD: Home meds: atorvastatin 80mg  daily  - Continue home meds   FEN/GI: heart healthy, carb modified  Prophylaxis: heparin   Disposition: continued inpatient stay   Subjective:   Patient today refusing to speak to provider. Patient was arousable but refusing to answer questions.   Objective: Temp:  [98.9 F (37.2 C)-99.2 F (37.3 C)] 99.2 F (37.3 C) (12/22 0351) Pulse Rate:  [90-97] 91 (12/22 0351) Resp:  [18-21] 18 (12/22 0351) BP: (102-132)/(79-98) 132/89 (12/22 0351) SpO2:  [94 %-99 %] 98 % (12/22 0500) Weight:  [277 lb 5.4 oz (125.8 kg)] 277 lb 5.4 oz (125.8 kg) (12/22 0351) Physical Exam: General: awake but would close eyes and refuse to answer questions, CPAP in place  Cardiovascular: RRR, no MRG Respiratory: CTAB, no wheezes, rales, or rhonchi (difficult exam due to body habitus) Abdomen: soft, non tender, non distended, bowel sounds normal  Extremities: no edema, non tender   Laboratory: Recent Labs  Lab 01/06/17 0046 01/06/17 0508  WBC 12.1* 12.2*  HGB 9.9* 9.1*  HCT 32.9* 30.5*  PLT 439* 436*   Recent Labs  Lab 01/06/17 0046 01/06/17 0508  NA 140 141  K 2.6* 2.6*  CL 91* 93*  CO2 35* 38*  BUN 25* 24*  CREATININE 1.65* 1.61*  CALCIUM 9.1 8.7*  PROT 7.0 7.0  BILITOT 0.4 0.8  ALKPHOS 168* 158*  ALT 16 16  AST 21 18  GLUCOSE 96 100*    Ref. Range 01/06/2017 05:00  Sample type Unknown ARTERIAL DRAW  Delivery systems Unknown NASAL CANNULA  O2 Content Latest Units: L/min 6.0  pH, Arterial Latest Ref Range: 7.350 - 7.450  7.466 (H)  pCO2 arterial Latest Ref Range: 32.0 - 48.0 mmHg 54.5 (H)  pO2, Arterial Latest Ref Range: 83.0 - 108.0 mmHg 141 (H)  Acid-Base Excess Latest Ref Range: 0.0 - 2.0 mmol/L 14.1 (H)  Bicarbonate Latest Ref Range: 20.0 - 28.0 mmol/L 38.8 (H)  O2 Saturation Latest Units: % 98.9  Patient temperature Unknown 98.6  Collection site Unknown RIGHT RADIAL  Allens test (pass/fail) Latest Ref Range: PASS  PASS    Ref. Range 01/06/2017 03:48  CK Total Latest Ref Range: 38 - 234 U/L 93    Ref. Range 01/06/2017 05:08  Alcohol, Ethyl (B) Latest Ref Range: <10 mg/dL <10   Imaging/Diagnostic Tests: Ct Head Wo  Contrast  Result Date: 01/06/2017 CLINICAL DATA:  56 year old female with syncope. EXAM: CT HEAD WITHOUT CONTRAST TECHNIQUE: Contiguous axial images were obtained from the base of the skull through the vertex without intravenous contrast. COMPARISON:  Head CT dated 06/12/2016 FINDINGS: Brain: Mild bifrontal atrophy as well as chronic microvascular ischemic changes. There is no acute intracranial hemorrhage. No mass effect or midline shift. No extra-axial fluid collection. Probably partially empty sella. Vascular: No hyperdense vessel or unexpected calcification. Skull: Normal. Negative for fracture or focal lesion. Sinuses/Orbits: The visualized paranasal sinuses are clear. There is mild bilateral mastoid effusion. Other: None IMPRESSION: 1. No acute intracranial pathology. 2. Mild age-related atrophy and chronic microvascular ischemic changes. Electronically Signed   By: Anner Crete M.D.   On: 01/06/2017 00:12   Dg Chest Port 1 View  Result Date: 01/06/2017 CLINICAL DATA:  Acute onset of generalized weakness and syncope. EXAM: PORTABLE CHEST 1 VIEW COMPARISON:  Chest radiograph and CTA of the chest performed 11/03/2016 FINDINGS: The lungs are hypoexpanded. Mild retrocardiac airspace opacity could reflect pneumonia. No pleural effusion or pneumothorax is seen. The cardiomediastinal silhouette is mildly enlarged. No  acute osseous abnormalities are identified. IMPRESSION: 1. Lungs hypoexpanded. Mild retrocardiac airspace opacity could reflect pneumonia. 2. Mild cardiomegaly. Electronically Signed   By: Garald Balding M.D.   On: 01/06/2017 00:46    Caroline More, DO 01/06/2017, 7:43 AM PGY-1, [facility] Intern pager: [phone], text pages welcome

## 2017-01-06 NOTE — Evaluation (Signed)
Physical Therapy Evaluation Patient Details Name: Tiffany [name] MRN: [id number] DOB: [birth year] Today's Date: 01/06/2017   History of Present Illness  Tiffany [name] is a 56 y.o. female admitted 01/05/17 with syncopal episode. Patient with fall, hypokalemia, anemia.   PMH: asthma/COPD (6L home O2 daytime, trilogy vent QHS), CKDII, T2DM, HFpEF, HLD, HTN, Bipolar 1 disorder, paranoid schizophrenia, morbid obesity  Hx of cocaine abuse  Clinical Impression  Patient presents with problems listed below.  Will benefit from acute PT to maximize functional mobility prior to discharge.  Once pain under control, feel patient will return to PLOF fairly quickly.  Recommend f/u HHPT for continued therapy to return to PLOF.    Follow Up Recommendations Home health PT;Supervision for mobility/OOB    Equipment Recommendations  None recommended by PT    Recommendations for Other Services       Precautions / Restrictions Precautions Precautions: Fall Precaution Comments: Rt knee and Lt foot pain Restrictions Weight Bearing Restrictions: No      Mobility  Bed Mobility Overal bed mobility: Needs Assistance Bed Mobility: Supine to Sit;Sit to Supine     Supine to sit: Mod assist;HOB elevated Sit to supine: Mod assist;HOB elevated   General bed mobility comments: Patient able to initiate moving toward EOB.  Assist to bring trunk up toward sitting.  Increase in Rt knee pain, and patient abruptly returned to supine with assist for safety.  Pain limiting mobility.  Transfers                 General transfer comment: Declined  Ambulation/Gait                Stairs            Wheelchair Mobility    Modified Rankin (Stroke Patients Only)       Balance                                             Pertinent Vitals/Pain Pain Assessment: 0-10 Pain Score: 8  Pain Location: Rt knee and Lt foot Pain Descriptors / Indicators: Aching Pain  Intervention(s): Limited activity within patient's tolerance;Monitored during session;Repositioned;Patient requesting pain meds-RN notified    Home Living Family/patient expects to be discharged to:: Private residence Living Arrangements: Children(Son and his boyfriend per chart) Available Help at Discharge: Family;Available 24 hours/day Type of Home: House Home Access: Ramped entrance     Home Layout: One level Home Equipment: Grab bars - tub/shower;Cane - single point;Walker - 2 wheels;Bedside commode;Walker - 4 wheels;Wheelchair - manual Additional Comments: home O2-6L    Prior Function Level of Independence: Independent with assistive device(s);Needs assistance   Gait / Transfers Assistance Needed: Patient reports she uses cane for ambulation.    ADL's / Homemaking Assistance Needed: Patient reports she performs bathing/dressing, meal prep.  Son provides meal prep and housekeeping.  Comments: Patient reports she drives     Hand Dominance   Dominant Hand: Right    Extremity/Trunk Assessment   Upper Extremity Assessment Upper Extremity Assessment: Defer to OT evaluation    Lower Extremity Assessment Lower Extremity Assessment: Generalized weakness;RLE deficits/detail;LLE deficits/detail RLE Deficits / Details: Strength grossly 3-/5 limited by pain in knee RLE: Unable to fully assess due to pain RLE Sensation: history of peripheral neuropathy RLE Coordination: decreased gross motor LLE Deficits / Details: Strength grossly 3/5; pain foot LLE: Unable to fully  assess due to pain LLE Sensation: history of peripheral neuropathy LLE Coordination: decreased gross motor       Communication   Communication: No difficulties  Cognition Arousal/Alertness: Awake/alert Behavior During Therapy: WFL for tasks assessed/performed Overall Cognitive Status: Within Functional Limits for tasks assessed                                        General Comments       Exercises     Assessment/Plan    PT Assessment Patient needs continued PT services  PT Problem List Decreased strength;Decreased activity tolerance;Decreased balance;Decreased mobility;Decreased coordination;Decreased knowledge of use of DME;Impaired sensation;Obesity;Pain       PT Treatment Interventions DME instruction;Gait training;Functional mobility training;Therapeutic activities;Therapeutic exercise;Patient/family education    PT Goals (Current goals can be found in the Care Plan section)  Acute Rehab PT Goals Patient Stated Goal: To decrease pain PT Goal Formulation: With patient Time For Goal Achievement: 01/13/17 Potential to Achieve Goals: Good    Frequency Min 3X/week   Barriers to discharge        Co-evaluation               AM-PAC PT "6 Clicks" Daily Activity  Outcome Measure Difficulty turning over in bed (including adjusting bedclothes, sheets and blankets)?: None Difficulty moving from lying on back to sitting on the side of the bed? : Unable Difficulty sitting down on and standing up from a chair with arms (e.g., wheelchair, bedside commode, etc,.)?: Unable Help needed moving to and from a bed to chair (including a wheelchair)?: A Lot Help needed walking in hospital room?: A Lot Help needed climbing 3-5 steps with a railing? : A Lot 6 Click Score: 12    End of Session Equipment Utilized During Treatment: Oxygen Activity Tolerance: Patient limited by pain Patient left: in bed;with call bell/phone within reach;with bed alarm set Nurse Communication: Patient requests pain meds PT Visit Diagnosis: Muscle weakness (generalized) (M62.81);History of falling (Z91.81);Difficulty in walking, not elsewhere classified (R26.2);Pain Pain - part of body: Knee;Ankle and joints of foot(Rt knee; Lt foot)    Time: 9169-4503 PT Time Calculation (min) (ACUTE ONLY): 17 min   Charges:   PT Evaluation $PT Eval Moderate Complexity: 1 Mod     PT G Codes:         Carita Pian. Sanjuana Kava, [facility] Acute Rehab Services Pager Felicity 01/06/2017, 3:26 PM

## 2017-01-06 NOTE — Consult Note (Signed)
[facility] [facility] Consult   Reason for Consult: Syncopal episode Referring Physician:  Dr Harl Favor Patient Identification: Tiffany [name] MRN:  [id number] Principal Diagnosis: <principal problem not specified> Diagnosis:   Patient Active Problem List   Diagnosis Date Noted  . Syncope [R55] 01/06/2017  . Hypomagnesemia [E83.42]   . Prolonged Q-T interval on ECG [R94.31]   . CKD (chronic kidney disease) [N18.9] 12/15/2016  . Carotid artery stenosis [I65.29]   . Right foot pain [M79.671] 11/15/2016  . Right carotid bruit [R09.89] 11/09/2016  . Hypokalemia [E87.6]   . Osteoarthritis [M19.90] 10/26/2016  . Dyspnea [R06.00] 10/07/2016  . Cardiac arrest ([place]) [I46.9] 09/08/2016  . History of open reduction and internal fixation (ORIF) procedure [Z98.890]   . Closed displaced transverse fracture of shaft of right tibia [E52.778E]   . Fall [W19.XXXA]   . Overactive bladder [N32.81] 06/07/2016  . QT prolongation [R94.31]   . OSA and COPD overlap syndrome (HCC) [G47.33, J44.9]   . Generalized weakness [R53.1]   . Arthritis [M19.90]   . Bipolar I disorder ([place]) [F31.9]   . Essential hypertension [I10] 03/22/2016  . Bipolar affective disorder, mixed, severe, with psychotic behavior ([place]) [F31.64] 11/28/2015  . Cocaine use disorder, severe, dependence ([place]) [F14.20] 11/28/2015  . Chronic diastolic congestive heart failure ([place]) [I50.32]   . Chronic respiratory failure with hypoxia and hypercapnia (HCC) [J96.11, J96.12] 06/22/2015  . Onychomycosis [B35.1] 01/21/2015  . Nasal congestion [R09.81] 12/17/2014  . Tobacco use disorder [F17.200] 07/22/2014  . COPD (chronic obstructive pulmonary disease) ([place]) [J44.9] 07/08/2014  . Knee pain, bilateral [M25.561, M25.562] 01/22/2013  . Seizure ([place]) [R56.9] 01/04/2013  . Health care maintenance [Z00.00] 11/25/2012  . History of kidney stones [Z87.442] 06/18/2012  . Chronic pain syndrome [G89.4] 06/18/2012  . Allergic reaction [T78.40XA]  04/07/2012  . HSV infection [B00.9] 08/30/2011  . Dyslipidemia [E78.5] 04/24/2011  . Anemia [D64.9] 04/24/2011  . Diabetic neuropathy ([place]) [E11.40] 04/24/2011  . Obstructive sleep apnea [G47.33] 10/18/2010  . Asthma [493] 10/18/2010  . Morbid obesity ([place]) [E66.01] 10/18/2010  . Type 2 diabetes mellitus ([place]) [E11.9] 10/18/2010    Total Time spent with patient: 30 minutes  Subjective:   Tiffany [name] is a 56 y.o. female patient admitted with syncopal episode.  HPI: Patient is 56 year old African-American female with significant history of asthma, COPD, diabetes, hypertension, hyperlipidemia, arthritis, emphysema and chronic kidney disease admitted due to syncopal episode.  Patient is known to this Probation officer from outpatient services.  Patient has history of bipolar disorder and schizophrenia.  Patient is not sure what happened to her but she believes she fell and prior to that she has tremors.  Consult was called because patient has slurred speech.  However patient today is clear coherent and relevant to provide information.  [name] not sure what causing tremors.  She used to take Geodon in the past which help her psychotic symptoms very well but recently restarted on Abilify and Geodon was discontinued because of prolonged QTC.  Patient is taking Abilify for past few months and she denies any side effects except for occasional tremors.  She feels less irritable, paranoid, depressed and she feels over that she is been not using drugs especially cocaine.  She believe her recent fall was due to low potassium.  Her potassium is 2.6.  Her UDS is negative for cocaine.  Patient does not want to change her medication.  She denies any hallucination, suicidal thoughts or any homicidal thought.  She feels Abilify helping her irritability  mood swing.  She has chronic health issues including shortness of breath and generalized weakness.  Past Psychiatric History: Patient has multiple psychiatric hospitalization  due to drug overdose and mental illness.  Her last hospitalization was in November 2017 at [facility].  In the past she had tried Risperdal, Wellbutrin, Xanax, Klonopin, trazodone, BuSpar and Geodon.  Patient denies any history of suicidal attempt but admitted history of suicidal thoughts.  She was taking Geodon 80 mg but it was discontinued due to high QTc interval.  Risk to Self: Is patient at risk for suicide?: No Risk to Others:   Prior Inpatient Therapy:   Prior Outpatient Therapy:    Past Medical History:  Past Medical History:  Diagnosis Date  . Anxiety   . Arthritis    "all over" (04/10/2016)  . Asthma   . Cardiac arrest ([place]) 09/08/2016   PEA  . Carotid artery stenosis    1-39% bilateral by dopplers 11/2016  . Chronic bronchitis ([place])   . Chronic diastolic (congestive) heart failure ([place])   . Chronic kidney disease    "I see a kidney dr." (04/10/2016)  . Cocaine abuse ([place])   . Complication of anesthesia    decreased bp, decreased heart rate  . Depression   . Disorder of nervous system   . Emphysema   . GERD (gastroesophageal reflux disease)   . Heart attack ([place]) 1980s  . History of blood transfusion 1994   "couldn't stop bleeding from my period"  . Hyperlipidemia LDL goal <70   . Hypertension   . Incontinence   . Manic depression ([place])   . On home oxygen therapy    "6L; 24/7" (04/10/2016)  . OSA on CPAP    "wear mask sometimes" (04/10/2016)  . Paranoid ([place])    "sometimes; I'm on RX for it" (04/10/2016)  . Pneumonia    "I've had it several times; haven't had it since 06/2015" (04/10/2016)  . Schizophrenia ([place])   . Seasonal allergies   . Seizures ([place])    "don't know what kind; last one was ~ 1 yr ago" (04/10/2016)  . Sinus trouble   . Stroke [facility]) 1980s   denies residual on 04/10/2016  . Type II diabetes mellitus ([place])     Past Surgical History:  Procedure Laterality Date  . CESAREAN SECTION  1997  . HERNIA REPAIR    . IR CHOLANGIOGRAM  EXISTING TUBE  07/20/2016  . IR PERC CHOLECYSTOSTOMY  05/10/2016  . IR RADIOLOGIST EVAL & MGMT  06/08/2016  . IR RADIOLOGIST EVAL & MGMT  06/29/2016  . IR SINUS/FIST TUBE CHK-NON GI  07/12/2016  . TIBIA IM NAIL INSERTION Right 07/12/2016   Procedure: INTRAMEDULLARY (IM) NAIL RIGHT TIBIA;  Surgeon: Leandrew Koyanagi, MD;  Location: [place];  Service: Orthopedics;  Laterality: Right;  . UMBILICAL HERNIA REPAIR  ~ 1963   "that's why I don't have a belly button"  . VAGINAL HYSTERECTOMY     Family History:  Family History  Problem Relation Age of Onset  . Cancer Father        prostate  . Cancer Mother        lung  . Depression Mother   . Depression Sister   . Anxiety disorder Sister   . Schizophrenia Sister   . Bipolar disorder Sister   . Depression Sister   . Depression Brother   . Heart failure Unknown        cousin   Family Psychiatric  History: Viewed  Social History:  Social History   Substance and Sexual Activity  Alcohol Use No  . Alcohol/week: 0.0 oz     Social History   Substance and Sexual Activity  Drug Use No   Comment: 04/10/2016 "last used cocaine back in November 2017"    Social History   Socioeconomic History  . Marital status: Widowed    Spouse name: None  . Number of children: 3  . Years of education: None  . Highest education level: None  Social Needs  . Financial resource strain: None  . Food insecurity - worry: None  . Food insecurity - inability: None  . Transportation needs - medical: None  . Transportation needs - non-medical: None  Occupational History  . Occupation: disabled    Comment: factory production  Tobacco Use  . Smoking status: Former Smoker    Packs/day: 1.50    Years: 38.00    Pack years: 57.00    Types: Cigarettes    Start date: 03/13/1977    Last attempt to quit: 04/10/2016    Years since quitting: 0.7  . Smokeless tobacco: Never Used  Substance and Sexual Activity  . Alcohol use: No    Alcohol/week: 0.0 oz  . Drug use: No     Comment: 04/10/2016 "last used cocaine back in November 2017"  . Sexual activity: No    Birth control/protection: Surgical  Other Topics Concern  . None  Social History Narrative   Has 1 son, [name]   Lives with son and his boyfriend   Her house has ramps and handrails should she ever needs them.    Her mother lives down the street from her and is a good support person in addition to her son.   She drives herself, has private transportation.    Cocaine free since 02/24/16, smoke free since 04/10/16   Additional Social History:    Allergies:   Allergies  Allergen Reactions  . Hydrocodone-Acetaminophen Shortness Of Breath  . Hydroxyzine Anaphylaxis and Shortness Of Breath  . Latuda [Lurasidone Hcl] Anaphylaxis  . Codeine Nausea And Vomiting  . Sulfa Antibiotics Itching    Labs:  Results for orders placed or performed during the hospital encounter of 01/05/17 (from the past 48 hour(s))  I-stat troponin, ED     Status: None   Collection Time: 01/06/17 12:37 AM  Result Value Ref Range   Troponin i, poc 0.00 0.00 - 0.08 ng/mL   Comment 3            Comment: Due to the release kinetics of cTnI, a negative result within the first hours of the onset of symptoms does not rule out myocardial infarction with certainty. If myocardial infarction is still suspected, repeat the test at appropriate intervals.   I-Stat CG4 Lactic Acid, ED     Status: Abnormal   Collection Time: 01/06/17 12:39 AM  Result Value Ref Range   Lactic Acid, Venous 2.17 (HH) 0.5 - 1.9 mmol/L   Comment NOTIFIED PHYSICIAN   Comprehensive metabolic panel     Status: Abnormal   Collection Time: 01/06/17 12:46 AM  Result Value Ref Range   Sodium 140 135 - 145 mmol/L   Potassium 2.6 (LL) 3.5 - 5.1 mmol/L    Comment: CRITICAL RESULT CALLED TO, READ BACK BY AND VERIFIED WITH: NUCKLES C,RN 01/06/17 0139 WAYK    Chloride 91 (L) 101 - 111 mmol/L   CO2 35 (H) 22 - 32 mmol/L   Glucose, Bld 96 65 - 99 mg/dL  BUN 25 (H) 6  - 20 mg/dL   Creatinine, Ser 1.65 (H) 0.44 - 1.00 mg/dL   Calcium 9.1 8.9 - 10.3 mg/dL   Total Protein 7.0 6.5 - 8.1 g/dL   Albumin 3.5 3.5 - 5.0 g/dL   AST 21 15 - 41 U/L   ALT 16 14 - 54 U/L   Alkaline Phosphatase 168 (H) 38 - 126 U/L   Total Bilirubin 0.4 0.3 - 1.2 mg/dL   GFR calc non Af Amer 33 (L) >60 mL/min   GFR calc Af Amer 39 (L) >60 mL/min    Comment: (NOTE) The eGFR has been calculated using the CKD EPI equation. This calculation has not been validated in all clinical situations. eGFR's persistently <60 mL/min signify possible Chronic Kidney Disease.    Anion gap 14 5 - 15  CBC with Differential     Status: Abnormal   Collection Time: 01/06/17 12:46 AM  Result Value Ref Range   WBC 12.1 (H) 4.0 - 10.5 K/uL   RBC 3.50 (L) 3.87 - 5.11 MIL/uL   Hemoglobin 9.9 (L) 12.0 - 15.0 g/dL   HCT 32.9 (L) 36.0 - 46.0 %   MCV 94.0 78.0 - 100.0 fL   MCH 28.3 26.0 - 34.0 pg   MCHC 30.1 30.0 - 36.0 g/dL   RDW 16.1 (H) 11.5 - 15.5 %   Platelets 439 (H) 150 - 400 K/uL   Neutrophils Relative % 72 %   Neutro Abs 8.7 (H) 1.7 - 7.7 K/uL   Lymphocytes Relative 22 %   Lymphs Abs 2.7 0.7 - 4.0 K/uL   Monocytes Relative 4 %   Monocytes Absolute 0.4 0.1 - 1.0 K/uL   Eosinophils Relative 2 %   Eosinophils Absolute 0.3 0.0 - 0.7 K/uL   Basophils Relative 0 %   Basophils Absolute 0.0 0.0 - 0.1 K/uL  Magnesium     Status: Abnormal   Collection Time: 01/06/17 12:46 AM  Result Value Ref Range   Magnesium 1.5 (L) 1.7 - 2.4 mg/dL  I-Stat CG4 Lactic Acid, ED     Status: None   Collection Time: 01/06/17  3:22 AM  Result Value Ref Range   Lactic Acid, Venous 1.35 0.5 - 1.9 mmol/L  CK     Status: None   Collection Time: 01/06/17  3:48 AM  Result Value Ref Range   Total CK 93 38 - 234 U/L  Magnesium     Status: None   Collection Time: 01/06/17  3:56 AM  Result Value Ref Range   Magnesium 2.2 1.7 - 2.4 mg/dL  Blood gas, arterial     Status: Abnormal   Collection Time: 01/06/17  5:00 AM   Result Value Ref Range   O2 Content 6.0 L/min   Delivery systems NASAL CANNULA    pH, Arterial 7.466 (H) 7.350 - 7.450   pCO2 arterial 54.5 (H) 32.0 - 48.0 mmHg   pO2, Arterial 141 (H) 83.0 - 108.0 mmHg   Bicarbonate 38.8 (H) 20.0 - 28.0 mmol/L   Acid-Base Excess 14.1 (H) 0.0 - 2.0 mmol/L   O2 Saturation 98.9 %   Patient temperature 98.6    Collection site RIGHT RADIAL    Drawn by [phone]    Sample type ARTERIAL DRAW    Allens test (pass/fail) PASS PASS  CBC     Status: Abnormal   Collection Time: 01/06/17  5:08 AM  Result Value Ref Range   WBC 12.2 (H) 4.0 - 10.5 K/uL   RBC 3.26 (  L) 3.87 - 5.11 MIL/uL   Hemoglobin 9.1 (L) 12.0 - 15.0 g/dL   HCT 30.5 (L) 36.0 - 46.0 %   MCV 93.6 78.0 - 100.0 fL   MCH 27.9 26.0 - 34.0 pg   MCHC 29.8 (L) 30.0 - 36.0 g/dL   RDW 15.9 (H) 11.5 - 15.5 %   Platelets 436 (H) 150 - 400 K/uL  Comprehensive metabolic panel     Status: Abnormal   Collection Time: 01/06/17  5:08 AM  Result Value Ref Range   Sodium 141 135 - 145 mmol/L   Potassium 2.6 (LL) 3.5 - 5.1 mmol/L    Comment: CRITICAL RESULT CALLED TO, READ BACK BY AND VERIFIED WITH: [facility] M,RN 01/06/17 0616 WAYK    Chloride 93 (L) 101 - 111 mmol/L   CO2 38 (H) 22 - 32 mmol/L   Glucose, Bld 100 (H) 65 - 99 mg/dL   BUN 24 (H) 6 - 20 mg/dL   Creatinine, Ser 1.61 (H) 0.44 - 1.00 mg/dL   Calcium 8.7 (L) 8.9 - 10.3 mg/dL   Total Protein 7.0 6.5 - 8.1 g/dL   Albumin 3.3 (L) 3.5 - 5.0 g/dL   AST 18 15 - 41 U/L   ALT 16 14 - 54 U/L   Alkaline Phosphatase 158 (H) 38 - 126 U/L   Total Bilirubin 0.8 0.3 - 1.2 mg/dL   GFR calc non Af Amer 34 (L) >60 mL/min   GFR calc Af Amer 40 (L) >60 mL/min    Comment: (NOTE) The eGFR has been calculated using the CKD EPI equation. This calculation has not been validated in all clinical situations. eGFR's persistently <60 mL/min signify possible Chronic Kidney Disease.    Anion gap 10 5 - 15  Ethanol     Status: None   Collection Time: 01/06/17  5:08 AM   Result Value Ref Range   Alcohol, Ethyl (B) <10 <10 mg/dL    Comment:        LOWEST DETECTABLE LIMIT FOR SERUM ALCOHOL IS 10 mg/dL FOR MEDICAL PURPOSES ONLY   Glucose, capillary     Status: None   Collection Time: 01/06/17  5:17 AM  Result Value Ref Range   Glucose-Capillary 86 65 - 99 mg/dL  Glucose, capillary     Status: Abnormal   Collection Time: 01/06/17 12:03 PM  Result Value Ref Range   Glucose-Capillary 114 (H) 65 - 99 mg/dL   *Note: Due to a large number of results and/or encounters for the requested time period, some results have not been displayed. A complete set of results can be found in Results Review.    Current Facility-Administered Medications  Medication Dose Route Frequency Provider Last Rate Last Dose  . acetaminophen (TYLENOL) tablet 650 mg  650 mg Oral Q6H PRN Caroline More, DO       Or  . acetaminophen (TYLENOL) suppository 650 mg  650 mg Rectal Q6H PRN Caroline More, DO      . albuterol (PROVENTIL) (2.5 MG/3ML) 0.083% nebulizer solution 2.5 mg  2.5 mg Nebulization Q6H PRN Caroline More, DO      . amitriptyline (ELAVIL) tablet 10 mg  10 mg Oral QHS Abraham, Sherin, DO      . ARIPiprazole (ABILIFY) tablet 5 mg  5 mg Oral Daily Abraham, Sherin, DO   5 mg at 01/06/17 1016  . aspirin EC tablet 81 mg  81 mg Oral Daily Caroline More, DO   81 mg at 01/06/17 0946  . atorvastatin (LIPITOR) tablet 80  mg  80 mg Oral Daily Caroline More, DO   80 mg at 01/06/17 6270  . benztropine (COGENTIN) tablet 0.5 mg  0.5 mg Oral BID Tammi Klippel, Sherin, DO   0.5 mg at 01/06/17 1016  . escitalopram (LEXAPRO) tablet 20 mg  20 mg Oral Daily Tammi Klippel, Sherin, DO   20 mg at 01/06/17 0946  . fluticasone furoate-vilanterol (BREO ELLIPTA) 100-25 MCG/INH 1 puff  1 puff Inhalation Daily Tammi Klippel, Sherin, DO      . gabapentin (NEURONTIN) tablet 600 mg  600 mg Oral TID Caroline More, DO   600 mg at 01/06/17 3500  . heparin injection 5,000 Units  5,000 Units Subcutaneous Q8H Abraham,  Sherin, DO      . insulin aspart (novoLOG) injection 0-9 Units  0-9 Units Subcutaneous TID WC Abraham, Sherin, DO      . mirtazapine (REMERON) tablet 45 mg  45 mg Oral QHS Abraham, Sherin, DO      . montelukast (SINGULAIR) tablet 10 mg  10 mg Oral QHS Abraham, Sherin, DO      . pantoprazole (PROTONIX) EC tablet 40 mg  40 mg Oral Daily Abraham, Sherin, DO   40 mg at 01/06/17 0946  . polyethylene glycol (MIRALAX / GLYCOLAX) packet 17 g  17 g Oral Daily PRN Caroline More, DO      . sodium chloride flush (NS) 0.9 % injection 3 mL  3 mL Intravenous Q12H Abraham, Sherin, DO   3 mL at 01/06/17 1016  . tiotropium (SPIRIVA) inhalation capsule 18 mcg  18 mcg Inhalation Daily Tammi Klippel, Sherin, DO      . torsemide (DEMADEX) tablet 80 mg  80 mg Oral BID Caroline More, DO   80 mg at 01/06/17 9381    Musculoskeletal: Strength & Muscle Tone: within normal limits Gait & Station: Lying in hospital bed Patient leans: N/A  Psychiatric Specialty Exam: Physical Exam  Review of Systems  Constitutional:       Tired  Respiratory: Positive for shortness of breath.   Psychiatric/Behavioral: Negative for hallucinations and suicidal ideas.    Blood pressure 132/89, pulse 91, temperature 99.2 F (37.3 C), temperature source Oral, resp. rate 18, height _0  (1.575 m), weight 125.8 kg (277 lb 5.4 oz), SpO2 98 %.Body mass index is 50.73 kg/m.  General Appearance: Casual  Eye Contact:  Good  Speech:  Slow  Volume:  Normal  Mood:  Anxious  Affect:  Congruent  Thought Process:  Goal Directed  Orientation:  Full (Time, Place, and Person)  Thought Content:  Logical  Suicidal Thoughts:  No  Homicidal Thoughts:  No  Memory:  Immediate;   Fair Recent;   Fair Remote;   Fair  Judgement:  Good  Insight:  Good  Psychomotor Activity:  Decreased  Concentration:  Concentration: Fair and Attention Span: Fair  Recall:  [employer] of Knowledge:  Good  Language:  Good  Akathisia:  No  Handed:  Right  AIMS (if  indicated):     Assets:  Communication Skills Desire for Improvement Housing Resilience  ADL's:  Intact  Cognition:  WNL  Sleep:        Treatment Plan Summary: Patient speech is clear, coherent and does not symptoms of psychosis at this time.  Treat underlying pathology and electrolyte imbalance as patient has low potassium.  Continue Abilify 5 mg daily, Cogentin 0.5 mg twice a day, Lexapro 20 mg daily and Remeron 45 mg at bedtime.  Patient can be seen outpatient for her medication management  appointment.   Disposition: No evidence of imminent risk to self or others at present.   Patient does not meet criteria for psychiatric inpatient admission. Supportive therapy provided about ongoing stressors. Discussed crisis plan, support from social network, calling 911, coming to the Emergency Department, and calling Suicide Hotline.  Kathlee Nations, MD 01/06/2017 12:48 PM

## 2017-01-07 DIAGNOSIS — E876 Hypokalemia: Secondary | ICD-10-CM

## 2017-01-07 DIAGNOSIS — R55 Syncope and collapse: Secondary | ICD-10-CM | POA: Diagnosis not present

## 2017-01-07 DIAGNOSIS — R9431 Abnormal electrocardiogram [ECG] [EKG]: Secondary | ICD-10-CM

## 2017-01-07 LAB — LIPID PANEL
Cholesterol: 129 mg/dL (ref 0–200)
HDL: 35 mg/dL — ABNORMAL LOW (ref >40)
LDL Cholesterol: 56 mg/dL (ref 0–99)
Total CHOL/HDL Ratio: 3.7 RATIO
Triglycerides: 188 mg/dL — ABNORMAL HIGH (ref <150)
VLDL: 38 mg/dL (ref 0–40)

## 2017-01-07 LAB — HEMOGLOBIN A1C
Hgb A1c MFr Bld: 6 % — ABNORMAL HIGH (ref 4.8–5.6)
Mean Plasma Glucose: 125.5 mg/dL

## 2017-01-07 LAB — GLUCOSE, CAPILLARY
Glucose-Capillary: 148 mg/dL — ABNORMAL HIGH (ref 65–99)
Glucose-Capillary: 87 mg/dL (ref 65–99)
Glucose-Capillary: 97 mg/dL (ref 65–99)

## 2017-01-07 LAB — BASIC METABOLIC PANEL
Anion gap: 11 (ref 5–15)
BUN: 24 mg/dL — ABNORMAL HIGH (ref 6–20)
CO2: 35 mmol/L — ABNORMAL HIGH (ref 22–32)
Calcium: 9.1 mg/dL (ref 8.9–10.3)
Chloride: 94 mmol/L — ABNORMAL LOW (ref 101–111)
Creatinine, Ser: 1.46 mg/dL — ABNORMAL HIGH (ref 0.44–1.00)
GFR calc Af Amer: 45 mL/min — ABNORMAL LOW (ref >60)
GFR calc non Af Amer: 39 mL/min — ABNORMAL LOW (ref >60)
Glucose, Bld: 146 mg/dL — ABNORMAL HIGH (ref 65–99)
Potassium: 3 mmol/L — ABNORMAL LOW (ref 3.5–5.1)
Sodium: 140 mmol/L (ref 135–145)

## 2017-01-07 LAB — TROPONIN I
Troponin I: <0.03 ng/mL (ref <0.03)
Troponin I: <0.03 ng/mL (ref <0.03)
Troponin I: <0.03 ng/mL (ref <0.03)

## 2017-01-07 LAB — CBC
HCT: 29.8 % — ABNORMAL LOW (ref 36.0–46.0)
Hemoglobin: 8.9 g/dL — ABNORMAL LOW (ref 12.0–15.0)
MCH: 28.3 pg (ref 26.0–34.0)
MCHC: 29.9 g/dL — ABNORMAL LOW (ref 30.0–36.0)
MCV: 94.6 fL (ref 78.0–100.0)
Platelets: 396 10*3/uL (ref 150–400)
RBC: 3.15 MIL/uL — ABNORMAL LOW (ref 3.87–5.11)
RDW: 16 % — ABNORMAL HIGH (ref 11.5–15.5)
WBC: 10.1 10*3/uL (ref 4.0–10.5)

## 2017-01-07 LAB — MAGNESIUM: Magnesium: 1.8 mg/dL (ref 1.7–2.4)

## 2017-01-07 LAB — HIV ANTIBODY (ROUTINE TESTING W REFLEX): HIV Screen 4th Generation wRfx: NONREACTIVE

## 2017-01-07 LAB — POTASSIUM: Potassium: 2.9 mmol/L — ABNORMAL LOW (ref 3.5–5.1)

## 2017-01-07 MED ORDER — POTASSIUM CHLORIDE CRYS ER 20 MEQ PO TBCR
40.0000 meq | EXTENDED_RELEASE_TABLET | Freq: Once | ORAL | Status: AC
  Administered 2017-01-07: 40 meq via ORAL
  Filled 2017-01-07: qty 2

## 2017-01-07 MED ORDER — POTASSIUM CHLORIDE CRYS ER 20 MEQ PO TBCR
40.0000 meq | EXTENDED_RELEASE_TABLET | ORAL | Status: AC
  Administered 2017-01-07 (×2): 40 meq via ORAL
  Filled 2017-01-07 (×2): qty 2

## 2017-01-07 NOTE — Care Management Note (Signed)
Case Management Note  Patient Details  Name: Tiffany [name] MRN: [id number] Date of Birth: 1960-09-06  Subjective/Objective:     Pt from home with son after syncopal event.  Pt has cane walker and needs no other equipment.                 Action/Plan: Pt requests HHPT.  Pt chose [facility]. Jermaine notified of referral.  Expected Discharge Date:  01/07/17               Expected Discharge Plan:  [place]  In-House Referral:  NA  Discharge planning Services  CM Consult  Post Acute Care Choice:  Home Health Choice offered to:  Patient  DME Arranged:  N/A DME Agency:  NA  HH Arranged:  PT [place] Agency:  [place]  Status of Service:  In process, will continue to follow  If discussed at Long Length of Stay Meetings, dates discussed:    Additional Comments:  Arley Phenix, RN 01/07/2017, 4:18 PM

## 2017-01-07 NOTE — Progress Notes (Signed)
Pt has order for discharge. I have called the attending team because pt last K+ level was 3.0. MD confirmed that pt is being d/c this evening after receiving evening dose of potassium. I have given to the patient the scheduled Kdur PO.  Ferdinand Lango, RN

## 2017-01-07 NOTE — Care Management CC44 (Signed)
Condition Code 44 Documentation Completed  Patient Details  Name: SKYELYNN RAMBEAU MRN: [id number] Date of Birth: 15-Feb-1960   Condition Code 44 given:  Yes Patient signature on Condition Code 44 notice:  Yes Documentation of 2 MD's agreement:  Yes Code 44 added to claim:  Yes    Arley Phenix, RN 01/07/2017, 4:12 PM

## 2017-01-07 NOTE — Evaluation (Signed)
Occupational Therapy Evaluation Patient Details Name: Tiffany [name] MRN: [id number] DOB: 11-Sep-1960 Today's Date: 01/07/2017    History of Present Illness Tiffany [name] is a 56 y.o. female admitted 01/05/17 with syncopal episode. Patient with fall, hypokalemia, anemia.   PMH: asthma/COPD (6L home O2 daytime, trilogy vent QHS), CKDII, T2DM, HFpEF, HLD, HTN, Bipolar 1 disorder, paranoid schizophrenia, morbid obesity  Hx of cocaine abuse   Clinical Impression   Pt is assisted for pericare, LB bathing and dressing and IADL by her son and a friend that lives with them. Pt required min assist to rise from bed and min guard with verbal cues for hand placement to stand from chair and BSC. Pt on 6L 02 and tolerated activity well. Pt has AE for LB ADL, but declines use.  No further OT needs.    Follow Up Recommendations  No OT follow up    Equipment Recommendations  None recommended by OT    Recommendations for Other Services       Precautions / Restrictions Precautions Precautions: Fall Precaution Comments:  chronic Rt knee and Lt foot pain Restrictions Weight Bearing Restrictions: No      Mobility Bed Mobility Overal bed mobility: Modified Independent             General bed mobility comments: HOB up  Transfers Overall transfer level: Needs assistance Equipment used: Rolling walker (2 wheeled) Transfers: Sit to/from Stand Sit to Stand: Min assist;Min guard         General transfer comment: min from bed, min guard from chair and BSC, cues for hand placement    Balance Overall balance assessment: Needs assistance   Sitting balance-Leahy Scale: Fair       Standing balance-Leahy Scale: Poor                             ADL either performed or assessed with clinical judgement   ADL Overall ADL's : At baseline                                             Vision Baseline Vision/History: Wears glasses Wears Glasses: At all  times Patient Visual Report: No change from baseline       Perception     Praxis      Pertinent Vitals/Pain Pain Assessment: Faces Faces Pain Scale: Hurts a little bit Pain Location: R foot Pain Descriptors / Indicators: Sore Pain Intervention(s): Monitored during session;Repositioned     Hand Dominance Right   Extremity/Trunk Assessment Upper Extremity Assessment Upper Extremity Assessment: Overall WFL for tasks assessed   Lower Extremity Assessment Lower Extremity Assessment: Defer to PT evaluation       Communication Communication Communication: No difficulties   Cognition Arousal/Alertness: Awake/alert Behavior During Therapy: WFL for tasks assessed/performed Overall Cognitive Status: Within Functional Limits for tasks assessed                                     General Comments       Exercises     Shoulder Instructions      Home Living Family/patient expects to be discharged to:: Private residence Living Arrangements: Children(handicapped son and a friend) Available Help at Discharge: Family;Available 24 hours/day Type of Home: House Home Access:  Ramped entrance     Home Layout: One level     Bathroom Shower/Tub: Teacher, early years/pre: Standard     Home Equipment: Grab bars - tub/shower;Cane - single point;Walker - 2 wheels;Bedside commode;Walker - 4 wheels;Wheelchair - Scientist, physiological: Reacher;Sock aid Additional Comments: home O2-6L      Prior Functioning/Environment Level of Independence: Independent with assistive device(s);Needs assistance  Gait / Transfers Assistance Needed: Patient reports she uses cane for ambulation.   ADL's / Homemaking Assistance Needed: assisted for LB bathing and dressing, some light meal prep, son and friend do housekeeping and heavy meal prep, pt sometimes needs assist for pericare   Comments: pt has AE, but reports she is not interested in using         OT Problem List: Impaired balance (sitting and/or standing)      OT Treatment/Interventions:      OT Goals(Current goals can be found in the care plan section) Acute Rehab OT Goals Patient Stated Goal: to go home  OT Frequency:     Barriers to D/C:            Co-evaluation              AM-PAC PT "6 Clicks" Daily Activity     Outcome Measure Help from another person eating meals?: None Help from another person taking care of personal grooming?: A Little Help from another person toileting, which includes using toliet, bedpan, or urinal?: A Lot Help from another person bathing (including washing, rinsing, drying)?: A Lot Help from another person to put on and taking off regular upper body clothing?: None Help from another person to put on and taking off regular lower body clothing?: A Lot 6 Click Score: 17   End of Session Equipment Utilized During Treatment: Gait belt;Rolling walker;Oxygen(6L) Nurse Communication: Mobility status(has urine in [facility])  Activity Tolerance: Patient tolerated treatment well Patient left: in chair;with call bell/phone within reach;with chair alarm set;with nursing/sitter in room  OT Visit Diagnosis: Unsteadiness on feet (R26.81)                Time: 5038-8828 OT Time Calculation (min): 41 min Charges:  OT General Charges $OT Visit: 1 Visit OT Evaluation $OT Eval Moderate Complexity: 1 Mod OT Treatments $Self Care/Home Management : 23-37 mins G-Codes: OT G-codes **NOT FOR INPATIENT CLASS** Functional Assessment Tool Used: Clinical judgement Functional Limitation: Self care Self Care Current Status (M0349): At least 40 percent but less than 60 percent impaired, limited or restricted Self Care Goal Status (Z7915): At least 40 percent but less than 60 percent impaired, limited or restricted Self Care Discharge Status [phone]): At least 40 percent but less than 60 percent impaired, limited or restricted   01/07/2017 Nestor Lewandowsky,  OTR/L Pager: [phone] Malka So 01/07/2017, 2:53 PM

## 2017-01-08 ENCOUNTER — Other Ambulatory Visit (HOSPITAL_COMMUNITY): Payer: Self-pay

## 2017-01-08 DIAGNOSIS — F200 Paranoid schizophrenia: Secondary | ICD-10-CM

## 2017-01-08 MED ORDER — MIRTAZAPINE 45 MG PO TBDP: 45.0000 mg | ORAL_TABLET | Freq: Every day | ORAL | 0 refills | Status: DC

## 2017-01-09 DIAGNOSIS — M6281 Muscle weakness (generalized): Secondary | ICD-10-CM | POA: Diagnosis not present

## 2017-01-09 DIAGNOSIS — J440 Chronic obstructive pulmonary disease with acute lower respiratory infection: Secondary | ICD-10-CM | POA: Diagnosis not present

## 2017-01-09 DIAGNOSIS — J189 Pneumonia, unspecified organism: Secondary | ICD-10-CM | POA: Diagnosis not present

## 2017-01-09 DIAGNOSIS — G4733 Obstructive sleep apnea (adult) (pediatric): Secondary | ICD-10-CM | POA: Diagnosis not present

## 2017-01-09 DIAGNOSIS — J45909 Unspecified asthma, uncomplicated: Secondary | ICD-10-CM | POA: Diagnosis not present

## 2017-01-09 DIAGNOSIS — I509 Heart failure, unspecified: Secondary | ICD-10-CM | POA: Diagnosis not present

## 2017-01-09 DIAGNOSIS — J449 Chronic obstructive pulmonary disease, unspecified: Secondary | ICD-10-CM | POA: Diagnosis not present

## 2017-01-09 DIAGNOSIS — R062 Wheezing: Secondary | ICD-10-CM | POA: Diagnosis not present

## 2017-01-09 DIAGNOSIS — R0602 Shortness of breath: Secondary | ICD-10-CM | POA: Diagnosis not present

## 2017-01-09 DIAGNOSIS — J9620 Acute and chronic respiratory failure, unspecified whether with hypoxia or hypercapnia: Secondary | ICD-10-CM | POA: Diagnosis not present

## 2017-01-10 ENCOUNTER — Ambulatory Visit (INDEPENDENT_AMBULATORY_CARE_PROVIDER_SITE_OTHER): Payer: Medicare HMO | Admitting: Family Medicine

## 2017-01-10 ENCOUNTER — Other Ambulatory Visit (HOSPITAL_COMMUNITY): Payer: Self-pay | Admitting: Psychiatry

## 2017-01-10 ENCOUNTER — Telehealth (HOSPITAL_COMMUNITY): Payer: Self-pay

## 2017-01-10 ENCOUNTER — Encounter: Payer: Self-pay | Admitting: Family Medicine

## 2017-01-10 VITALS — BP 128/80 | HR 84 | Temp 98.2°F | Ht 62.0 in | Wt 292.2 lb

## 2017-01-10 DIAGNOSIS — G894 Chronic pain syndrome: Secondary | ICD-10-CM | POA: Diagnosis not present

## 2017-01-10 DIAGNOSIS — E876 Hypokalemia: Secondary | ICD-10-CM

## 2017-01-10 DIAGNOSIS — F200 Paranoid schizophrenia: Secondary | ICD-10-CM

## 2017-01-10 MED ORDER — TRAMADOL HCL 50 MG PO TABS: 50.0000 mg | ORAL_TABLET | Freq: Three times a day (TID) | ORAL | 0 refills | Status: DC | PRN

## 2017-01-10 NOTE — Patient Instructions (Signed)
You were seen in clinic for a hospital follow up visit and I am glad you are doing better! We checked your potassium and other electrolytes today through bloodwork and I will let you know once the results are back.   For your arthritis, I have prescribed you some Tramadol until you can see your PCP on 01/31/2017.  Unfortunately, the options of pain medication due to your hydrocodone allergy and kidney function is somewhat limited so I would advise that you use Tylenol and Voltaren gel first and then the Tramadol for breakthrough pain.   If you have any new or worsening symptoms in the meantime, you may need to return to clinic to be seen sooner.   Be well, Lovenia Kim, MD

## 2017-01-10 NOTE — Progress Notes (Signed)
   Subjective:   Patient ID: Tiffany [name]    DOB: 11/08/1960, 56 y.o. female   MRN: [id number]  CC: hospital f/u  HPI: Tiffany [name] is a 56 y.o. female who presents to clinic today for hospital follow-up.  Syncope Reports she had an unwitnessed syncopal episode on morning of admission on 01/05/2017 to [facility].  Per hospital notes, due to unclear etiology, however likely multifactorial related to deconditioning, medication management and electrolyte imbalance. CT head negative for acute findings and EKG showed prolonged QTc. It is possible symptoms may have been 2/2 overdiuresis with torsemide causing orthostasis and hypokalemia.  Electrolytes repleted and she was discharged 01/07/2017.  She reports today she feels okay and has not had any additional falls.  Denies dizziness, nausea, vomiting, palpitations, SOB, CP.  Ran out of oxygen yesterday while at her sister's house.  Told her mother to get her O2 from the car but in the meantime felt like she could not breathe very well.  She does have enough oxygen currently and will be going to [facility] for more.  Is on 6L per [place] at home.   Chronic pain Reports that all her joints are achy and stiff. She ambulates with a cane.  Affecting primarily her hands, shoulders and knees bilaterally.  She used to take Amitryptiline however psychiatrist told her to stop taking this medication.  [name] currently not taking any medications for it.  Has tried Tylenol and Voltaren gel in the past which have not provided much relief.    ROS: Denies fevers, chills, nausea, vomiting, rash.   [place]: Pertinent past medical, surgical, family, and social history were reviewed and updated as appropriate. Smoking status reviewed. Medications reviewed.  Objective:   BP 128/80   Pulse 84   Temp 98.2 F (36.8 C) (Oral)   Ht 5\' 2"  (1.575 m)   Wt 292 lb 3.2 oz (132.5 kg)   SpO2 97%   BMI 53.44 kg/m  Vitals and nursing note reviewed.  General: 56  yo female, NAD  HEENT: NCAT, EOMI, MMM, o/p clear, supplemental O2 via [place] in place  Neck: supple, non-tender  CV: RRR no MRG  Lungs: CTAB, no wheeze or crackles appreciated, [place] in place, normal WOB  Abdomen: soft, NTND, +bs  Skin: warm, dry, no rash Extremities: warm and well perfused, normal tone  Assessment & Plan:   Hypokalemia Hypokalemic on admission, repleted.  Per chart review, K+ 2.9 on 12/23 -Will repeat BMET today to check potassium and other lytes   Chronic pain syndrome Patient has follow up with Orthopedics on 01/17/2017.  Complaining of chronic achy joint pain.  No improvement with Tylenol or Voltaren gel. Patient with kidney disease which limits options.  Tramadol has helped in the past, however with h/o seizure disorder.  -Rx: Tramadol - advised to limit its use only after trying Tylenol and diclofenac  -Will  f/u with Ortho for further recommendations however chronic pain is likely 2/2 fibromyalgia  -f/u PCP 01/31/2017   Orders Placed This Encounter  Procedures  . Basic Metabolic Panel   Meds ordered this encounter  Medications  . traMADol (ULTRAM) 50 MG tablet    Sig: Take 1 tablet (50 mg total) by mouth every 8 (eight) hours as needed.    Dispense:  30 tablet    Refill:  0    Lovenia Kim, MD [place], PGY-2 01/10/2017 4:30 PM

## 2017-01-10 NOTE — Telephone Encounter (Signed)
Contacted pt today regarding appointment this week, she reports she was recently in the hosp, isnt feeling well today and is going to see her doc today and she also has appointment tomor and she told me to call her back tomor and hope to catch her in the morning for a visit.  Marylouise Stacks, EMT-Paramedic  01/10/17

## 2017-01-10 NOTE — Assessment & Plan Note (Signed)
Hypokalemic on admission, repleted.  Per chart review, K+ 2.9 on 12/23 -Will repeat BMET today to check potassium and other lytes

## 2017-01-10 NOTE — Assessment & Plan Note (Signed)
Patient has follow up with Orthopedics on 01/17/2017.  Complaining of chronic achy joint pain.  No improvement with Tylenol or Voltaren gel. Patient with kidney disease which limits options.  Tramadol has helped in the past, however with h/o seizure disorder.  -Rx: Tramadol - advised to limit its use only after trying Tylenol and diclofenac  -Will  f/u with Ortho for further recommendations however chronic pain is likely 2/2 fibromyalgia  -f/u PCP 01/31/2017

## 2017-01-11 ENCOUNTER — Telehealth (HOSPITAL_COMMUNITY): Payer: Self-pay

## 2017-01-11 ENCOUNTER — Ambulatory Visit (HOSPITAL_COMMUNITY): Payer: Medicare HMO | Admitting: Licensed Clinical Social Worker

## 2017-01-11 ENCOUNTER — Other Ambulatory Visit (HOSPITAL_COMMUNITY): Payer: Self-pay

## 2017-01-11 ENCOUNTER — Telehealth (HOSPITAL_COMMUNITY): Payer: Self-pay | Admitting: Pharmacist

## 2017-01-11 ENCOUNTER — Ambulatory Visit: Payer: Medicare HMO | Admitting: Physical Therapy

## 2017-01-11 DIAGNOSIS — F200 Paranoid schizophrenia: Secondary | ICD-10-CM

## 2017-01-11 LAB — BASIC METABOLIC PANEL
BUN/Creatinine Ratio: 14 (ref 9–23)
BUN: 18 mg/dL (ref 6–24)
CO2: 29 mmol/L (ref 20–29)
Calcium: 9.9 mg/dL (ref 8.7–10.2)
Chloride: 102 mmol/L (ref 96–106)
Creatinine, Ser: 1.33 mg/dL — ABNORMAL HIGH (ref 0.57–1.00)
GFR calc Af Amer: 51 mL/min/{1.73_m2} — ABNORMAL LOW (ref >59)
GFR calc non Af Amer: 44 mL/min/{1.73_m2} — ABNORMAL LOW (ref >59)
Glucose: 101 mg/dL — ABNORMAL HIGH (ref 65–99)
Potassium: 3.9 mmol/L (ref 3.5–5.2)
Sodium: 149 mmol/L — ABNORMAL HIGH (ref 134–144)

## 2017-01-11 MED ORDER — ESCITALOPRAM OXALATE 20 MG PO TABS: 20.0000 mg | ORAL_TABLET | Freq: Every day | ORAL | 0 refills | Status: DC

## 2017-01-11 NOTE — Progress Notes (Signed)
Paramedicine Encounter    Patient ID: Tiffany [name], female    DOB: 11/23/1960, 56 y.o.   MRN: [id number]   Patient Care Team: [name], MD as PCP - General (Family Medicine) Dr. Verlin Grills (Family Medicine) Melancon, York Ram, MD (Inactive) as Resident (Family Medicine) Kennith Center, RD as Dietitian (Family Medicine)  Patient Active Problem List   Diagnosis Date Noted  . Syncope 01/06/2017  . Hypomagnesemia   . Prolonged Q-T interval on ECG   . CKD (chronic kidney disease) 12/15/2016  . Carotid artery stenosis   . Right foot pain 11/15/2016  . Right carotid bruit 11/09/2016  . Hypokalemia   . Osteoarthritis 10/26/2016  . Dyspnea 10/07/2016  . Cardiac arrest ([place]) 09/08/2016  . History of open reduction and internal fixation (ORIF) procedure   . Closed displaced transverse fracture of shaft of right tibia   . Fall   . Overactive bladder 06/07/2016  . QT prolongation   . OSA and COPD overlap syndrome ([place])   . Generalized weakness   . Arthritis   . Bipolar I disorder ([place])   . Essential hypertension 03/22/2016  . Bipolar affective disorder, mixed, severe, with psychotic behavior ([place]) 11/28/2015  . Cocaine use disorder, severe, dependence ([place]) 11/28/2015  . Chronic diastolic congestive heart failure ([place])   . Chronic respiratory failure with hypoxia and hypercapnia ([place]) 06/22/2015  . Onychomycosis 01/21/2015  . Nasal congestion 12/17/2014  . Tobacco use disorder 07/22/2014  . COPD (chronic obstructive pulmonary disease) ([place]) 07/08/2014  . Knee pain, bilateral 01/22/2013  . Seizure ([place]) 01/04/2013  . Health care maintenance 11/25/2012  . History of kidney stones 06/18/2012  . Chronic pain syndrome 06/18/2012  . Allergic reaction 04/07/2012  . HSV infection 08/30/2011  . Dyslipidemia 04/24/2011  . Anemia 04/24/2011  . Diabetic neuropathy ([place]) 04/24/2011  . Obstructive sleep apnea 10/18/2010  . Asthma 10/18/2010  . Morbid obesity ([place]) 10/18/2010   . Type 2 diabetes mellitus ([place]) 10/18/2010    Current Outpatient Medications:  .  albuterol (PROVENTIL HFA;VENTOLIN HFA) 108 (90 Base) MCG/ACT inhaler, Inhale 2 puffs every 6 (six) hours as needed into the lungs for wheezing or shortness of breath., Disp: 18 Inhaler, Rfl: 0 .  albuterol (PROVENTIL) (2.5 MG/3ML) 0.083% nebulizer solution, Take 3 mLs (2.5 mg total) every 6 (six) hours as needed by nebulization for wheezing or shortness of breath., Disp: 150 mL, Rfl: 1 .  amLODipine (NORVASC) 5 MG tablet, Take 1 tablet (5 mg total) by mouth daily., Disp: 90 tablet, Rfl: 0 .  ARIPiprazole (ABILIFY) 5 MG tablet, Take1 tab adily, Disp: 30 tablet, Rfl: 1 .  aspirin 81 MG EC tablet, Take 1 tablet (81 mg total) by mouth daily. Swallow whole., Disp: 90 tablet, Rfl: 0 .  atorvastatin (LIPITOR) 80 MG tablet, Take 1 tablet (80 mg total) by mouth daily., Disp: 60 tablet, Rfl: 3 .  baclofen (LIORESAL) 10 MG tablet, Take 10 mg by mouth 3 (three) times daily., Disp: , Rfl:  .  BREO ELLIPTA 100-25 MCG/INH AEPB, INHALE 1 PUFF EVERY DAY, Disp: 60 each, Rfl: 2 .  Diclofenac Sodium 3 % GEL, Use on area of the pain, Disp: 100 g, Rfl: 0 .  escitalopram (LEXAPRO) 20 MG tablet, Take 1 tablet (20 mg total) by mouth daily., Disp: 90 tablet, Rfl: 0 .  flunisolide (NASALIDE) 25 MCG/ACT (0.025%) SOLN, Place 2 sprays into the nose daily., Disp: 1 Bottle, Rfl: 2 .  gabapentin (NEURONTIN) 600 MG tablet, Take 1  tablet (600 mg total) by mouth 3 (three) times daily., Disp: 270 tablet, Rfl: 0 .  guaiFENesin (MUCINEX) 600 MG 12 hr tablet, Take 1 tablet (600 mg total) by mouth 2 (two) times daily., Disp: 30 tablet, Rfl: 0 .  metFORMIN (GLUCOPHAGE-XR) 500 MG 24 hr tablet, Take 1 tablet (500 mg total) by mouth 2 (two) times daily., Disp: 180 tablet, Rfl: 0 .  mirtazapine (REMERON SOL-TAB) 45 MG disintegrating tablet, Take 1 tablet (45 mg total) by mouth at bedtime., Disp: 90 tablet, Rfl: 0 .  montelukast (SINGULAIR) 10 MG tablet, Take  1 tablet (10 mg total) by mouth at bedtime., Disp: 90 tablet, Rfl: 1 .  Multiple Vitamin (MULTIVITAMIN WITH MINERALS) TABS tablet, Take 1 tablet by mouth daily., Disp: , Rfl:  .  omeprazole (PRILOSEC) 40 MG capsule, Take 1 capsule (40 mg total) by mouth 2 (two) times daily. in the morning and evebing, Disp: 56 capsule, Rfl: 2 .  OXYGEN, Inhale 6 L into the lungs daily., Disp: , Rfl:  .  potassium chloride (K-DUR) 10 MEQ tablet, Take 6 tablets (60 mEq total) by mouth 2 (two) times daily., Disp: 360 tablet, Rfl: 6 .  tiotropium (SPIRIVA HANDIHALER) 18 MCG inhalation capsule, Place 1 capsule (18 mcg total) into inhaler and inhale daily., Disp: 90 capsule, Rfl: 1 .  torsemide (DEMADEX) 20 MG tablet, Take 4 tablets (80 mg total) by mouth 2 (two) times daily., Disp: 720 tablet, Rfl: 3 .  traMADol (ULTRAM) 50 MG tablet, Take 1 tablet (50 mg total) by mouth every 8 (eight) hours as needed., Disp: 30 tablet, Rfl: 0 .  Trolamine Salicylate 10 % LOTN, Apply 1 patch topically 3 (three) times daily as needed., Disp: 141 g, Rfl: 2 .  benztropine (COGENTIN) 0.5 MG tablet, Take 1 tablet (0.5 mg total) by mouth 2 (two) times daily morning and evening (Patient not taking: Reported on 01/11/2017), Disp: 60 tablet, Rfl: 0 Allergies  Allergen Reactions  . Hydrocodone-Acetaminophen Shortness Of Breath  . Hydroxyzine Anaphylaxis and Shortness Of Breath  . Latuda [Lurasidone Hcl] Anaphylaxis  . Codeine Nausea And Vomiting  . Sulfa Antibiotics Itching     Social History   Socioeconomic History  . Marital status: Widowed    Spouse name: Not on file  . Number of children: 3  . Years of education: Not on file  . Highest education level: Not on file  Social Needs  . Financial resource strain: Not on file  . Food insecurity - worry: Not on file  . Food insecurity - inability: Not on file  . Transportation needs - medical: Not on file  . Transportation needs - non-medical: Not on file  Occupational History  .  Occupation: disabled    Comment: factory production  Tobacco Use  . Smoking status: Former Smoker    Packs/day: 1.50    Years: 38.00    Pack years: 57.00    Types: Cigarettes    Start date: 03/13/1977    Last attempt to quit: 04/10/2016    Years since quitting: 0.7  . Smokeless tobacco: Never Used  Substance and Sexual Activity  . Alcohol use: No    Alcohol/week: 0.0 oz  . Drug use: No    Comment: 04/10/2016 "last used cocaine back in November 2017"  . Sexual activity: No    Birth control/protection: Surgical  Other Topics Concern  . Not on file  Social History Narrative   Has 1 son, [name]   Lives with son and his boyfriend  Her house has ramps and handrails should she ever needs them.    Her mother lives down the street from her and is a good support person in addition to her son.   She drives herself, has private transportation.    Cocaine free since 02/24/16, smoke free since 04/10/16    Physical Exam  SAFE - 12/21/16 1300      Situation   Heart failure history  Exisiting    Comorbidities  Anemia;COPD;DM;HTN;Sleep Apnea    Readmitted within 30 days  No    Hospital admission within past 12 months  Yes      Assessment   Lives alone  No    Primary support person  -- sons   sons   Mode of transportation  personal car;scat;family/friends    Other services involved  None    Home equipement  Cane;Home O2;Scale;Wheelchair      Weight   Weighs self daily  Yes    Scale provided  No    Records on weight chart  No      Resources   Has "Living better w/heart failure" book  Yes    Has HF Zone tool  No    Able to identify yellow zone signs/when to call MD  Yes    Records zone daily  No      Medications   Uses a pill box  Yes    Who stocks the pill box  self    Pill box checked this visit  No    Pill box refilled this visit  No    Difficulty obtaining medications  No    Mail order medications  Yes    New medications at home today  No    Missed one or more doses of  medications per week  No      Nutrition   Patient receives meals on wheels  No    Patient follows low sodium diet  No    Has foods at home that meet the current recommended diet  No    Patient follows low sugar/card diet  Yes    Nutritional concerns/issues  y      Activity Level   ADL's/Mobility  Independent      Urine   Difficulty urinating  No    Changes in urine  Ordor         Future Appointments  Date Time Provider [place]  01/11/2017  2:30 PM Mindi Curling [facility] None  01/17/2017  2:15 PM Starr Lake L, PT [facility] [place]  01/18/2017  3:00 PM Larey Days, PT [facility] [place]  01/19/2017  3:00 PM [name], PA-C [facility]  01/25/2017  3:00 PM [facility] PA/NP [facility] None  01/31/2017  1:50 PM [name], MD [facility]  02/05/2017  3:30 PM Kennith Center, RD [facility] [place]  02/22/2017  3:30 PM Berenice Bouton, Janett Billow R [facility] None  03/13/2017  2:15 PM Arfeen, Arlyce Harman, MD [facility] None   BP 140/90   Pulse 88   Resp 16   SpO2 98%  Weight yesterday-292 @ doc office  Last visit weight-290  Pt reports she recently was in the hosp for syncopal/fall episode. She went yesterday for PCP f/u. When I called her yesterday she wasn't feeling well, today she reports she is feeling better. She reports she ran out of her 68 the other day at her sisters house-she has more tanks in her car but still has to go to office to  swap out her tanks.  It appears at her last admission it was listed for her to stop the metolazone--she said she was going to continue it as the  hospitalists were not her doctors. Will check with heart clinic on that and see what they advise. She last took it on Tuesday and Thursday this week. We discussed her diet and she still eats fast food for all meals. She states she doesn't cook so she has to eat something. I advised her those foods cause her fluid levels to increase due to the high amount of sodiums in the  mcdonalds and taco bell and her meds arent able to work as well as they should. She didn't seem to care about any of that information and seemed to be getting ill and frustrated with our visit today. I asked her if this was going to be helpful for her for me to come out and she said not if I challenged everything however I told her these are the things her doctors want her to do.  She feels like her breathing is doing fine. She denies any dizziness since she has been home from hosp.  She hasnt weighed yet this morning. She states her doctor yesterday thought she was getting extra fluid on her and she took metolazone today.  [name] for pill packing and refills. She has duplicate meds in bottles and her pill pack-she has 40mg  in her pill pack and an 80mg  bottle of atorvastatin and she states she takes half of the 80 with the other 40. [name] not interested in having everything at one pharmacy. She has old directions on her torsemide but takes it as listed in Epic-advised her to be sure to have [facility] give [facility] the correct [facility] so there are no problems with the refills-she said they didn't have the correct dose directions but they know what they are doing and she will get them. This pt appears to do what she wants to do regardless and not sure she is going to benefit with the program.  Waiting to hear back about her metolazone and what she should do.    ACTION: Home visit completed  Marylouise Stacks, EMT-Paramedic 01/11/17

## 2017-01-11 NOTE — Telephone Encounter (Signed)
Metolazone twice weekly was d/c'd at recent hospitalization 2/2 possible dehydration/overdiuresis with syncopal episode. Katie (paramedic) is stating that patient is refusing to d/c metolazone bc the FM MD that saw her is not her "usual doctor" but she had another near syncopal episode this past weekend. I have advised Joellen [place] to have patient hold her metolazone and call us if she has weight gain >3lb in 1 day or >5lb in 1 week or any increased SOB or edema until we see her in clinic on 01/25/17.   Ruta Hinds. Velva Harman, PharmD, BCPS, CPP Clinical Pharmacist Pager: [phone] Phone: [phone] 01/11/2017 1:51 PM

## 2017-01-11 NOTE — Telephone Encounter (Signed)
Doroteo Bradford advised that she does need to d/c the metolazone and contact the clinic if weight increased 3lbs overnight or 5lbs within a week or if she has more edema or increased sob. She has clinic appointment on 1/10 and it can be further addressed at that time. This information was relayed to patient, she replied with ok. I reminded her of the appointment in [place] and where it was.   Marylouise Stacks, EMT-Paramedic 01/11/17

## 2017-01-12 ENCOUNTER — Other Ambulatory Visit (HOSPITAL_COMMUNITY): Payer: Self-pay | Admitting: Psychiatry

## 2017-01-12 ENCOUNTER — Telehealth: Payer: Self-pay | Admitting: Internal Medicine

## 2017-01-12 ENCOUNTER — Other Ambulatory Visit: Payer: Self-pay | Admitting: Internal Medicine

## 2017-01-12 DIAGNOSIS — F200 Paranoid schizophrenia: Secondary | ICD-10-CM

## 2017-01-12 MED ORDER — ATORVASTATIN CALCIUM 80 MG PO TABS: 80.0000 mg | ORAL_TABLET | Freq: Every day | ORAL | 3 refills | Status: DC

## 2017-01-12 MED ORDER — AMLODIPINE BESYLATE 5 MG PO TABS: 5.0000 mg | ORAL_TABLET | Freq: Every day | ORAL | 0 refills | Status: DC

## 2017-01-12 NOTE — Telephone Encounter (Signed)
Patient wants results from potassium test and also wants to know when she will get a referral for her arthritis?  # [phone]

## 2017-01-12 NOTE — Telephone Encounter (Signed)
Will forward to Dr. Reesa Chew to advise. Quinteria Chisum,CMA

## 2017-01-13 DIAGNOSIS — F209 Schizophrenia, unspecified: Secondary | ICD-10-CM | POA: Diagnosis not present

## 2017-01-13 DIAGNOSIS — D631 Anemia in chronic kidney disease: Secondary | ICD-10-CM | POA: Diagnosis not present

## 2017-01-13 DIAGNOSIS — E1122 Type 2 diabetes mellitus with diabetic chronic kidney disease: Secondary | ICD-10-CM | POA: Diagnosis not present

## 2017-01-13 DIAGNOSIS — I5032 Chronic diastolic (congestive) heart failure: Secondary | ICD-10-CM | POA: Diagnosis not present

## 2017-01-13 DIAGNOSIS — F319 Bipolar disorder, unspecified: Secondary | ICD-10-CM | POA: Diagnosis not present

## 2017-01-13 DIAGNOSIS — R55 Syncope and collapse: Secondary | ICD-10-CM | POA: Diagnosis not present

## 2017-01-13 DIAGNOSIS — N183 Chronic kidney disease, stage 3 (moderate): Secondary | ICD-10-CM | POA: Diagnosis not present

## 2017-01-13 DIAGNOSIS — J449 Chronic obstructive pulmonary disease, unspecified: Secondary | ICD-10-CM | POA: Diagnosis not present

## 2017-01-13 DIAGNOSIS — I130 Hypertensive heart and chronic kidney disease with heart failure and stage 1 through stage 4 chronic kidney disease, or unspecified chronic kidney disease: Secondary | ICD-10-CM | POA: Diagnosis not present

## 2017-01-15 ENCOUNTER — Telehealth (HOSPITAL_COMMUNITY): Payer: Self-pay | Admitting: Licensed Clinical Social Worker

## 2017-01-17 ENCOUNTER — Telehealth: Payer: Self-pay | Admitting: Cardiology

## 2017-01-17 ENCOUNTER — Ambulatory Visit: Payer: Medicare HMO | Admitting: Physical Therapy

## 2017-01-17 DIAGNOSIS — N183 Chronic kidney disease, stage 3 (moderate): Secondary | ICD-10-CM | POA: Diagnosis not present

## 2017-01-17 DIAGNOSIS — F319 Bipolar disorder, unspecified: Secondary | ICD-10-CM | POA: Diagnosis not present

## 2017-01-17 DIAGNOSIS — F209 Schizophrenia, unspecified: Secondary | ICD-10-CM | POA: Diagnosis not present

## 2017-01-17 DIAGNOSIS — J449 Chronic obstructive pulmonary disease, unspecified: Secondary | ICD-10-CM | POA: Diagnosis not present

## 2017-01-17 DIAGNOSIS — E1122 Type 2 diabetes mellitus with diabetic chronic kidney disease: Secondary | ICD-10-CM | POA: Diagnosis not present

## 2017-01-17 DIAGNOSIS — I5032 Chronic diastolic (congestive) heart failure: Secondary | ICD-10-CM | POA: Diagnosis not present

## 2017-01-17 DIAGNOSIS — R55 Syncope and collapse: Secondary | ICD-10-CM | POA: Diagnosis not present

## 2017-01-17 DIAGNOSIS — D631 Anemia in chronic kidney disease: Secondary | ICD-10-CM | POA: Diagnosis not present

## 2017-01-17 DIAGNOSIS — I130 Hypertensive heart and chronic kidney disease with heart failure and stage 1 through stage 4 chronic kidney disease, or unspecified chronic kidney disease: Secondary | ICD-10-CM | POA: Diagnosis not present

## 2017-01-17 NOTE — Telephone Encounter (Signed)
Returned call to patient. She states Dr. [name], physchiatrist recommended ability 5 mg at bedtime. Patient was advised to consult with cardiologist before starting. Informed patient I would send to [name] for review. Patient verbalized understanding and thanked me for the call.

## 2017-01-17 NOTE — Telephone Encounter (Signed)
New message   Pt c/o medication issue:  1. Name of Medication: abilify, gierasidone  2. How are you currently taking this medication (dosage and times per day)? 5mg  and 80mg   3. Are you having a reaction (difficulty breathing--STAT)? no  4. What is your medication issue? Wants to know if its ok to take both medications together

## 2017-01-18 ENCOUNTER — Emergency Department (HOSPITAL_COMMUNITY)
Admission: EM | Admit: 2017-01-18 | Discharge: 2017-01-19 | Disposition: A | Discharge status: Home / Self Care | Payer: Medicare HMO | Attending: Emergency Medicine | Admitting: Emergency Medicine

## 2017-01-18 ENCOUNTER — Ambulatory Visit: Payer: Medicare HMO | Attending: Family Medicine | Admitting: Physical Therapy

## 2017-01-18 ENCOUNTER — Telehealth: Payer: Self-pay | Admitting: *Deleted

## 2017-01-18 ENCOUNTER — Encounter (HOSPITAL_COMMUNITY): Payer: Self-pay

## 2017-01-18 ENCOUNTER — Encounter: Payer: Self-pay | Admitting: Physical Therapy

## 2017-01-18 ENCOUNTER — Other Ambulatory Visit: Payer: Self-pay

## 2017-01-18 DIAGNOSIS — R0981 Nasal congestion: Secondary | ICD-10-CM | POA: Insufficient documentation

## 2017-01-18 DIAGNOSIS — M25561 Pain in right knee: Secondary | ICD-10-CM | POA: Insufficient documentation

## 2017-01-18 DIAGNOSIS — Z7984 Long term (current) use of oral hypoglycemic drugs: Secondary | ICD-10-CM | POA: Diagnosis not present

## 2017-01-18 DIAGNOSIS — R51 Headache: Secondary | ICD-10-CM | POA: Diagnosis not present

## 2017-01-18 DIAGNOSIS — R5383 Other fatigue: Secondary | ICD-10-CM | POA: Insufficient documentation

## 2017-01-18 DIAGNOSIS — G8929 Other chronic pain: Secondary | ICD-10-CM | POA: Diagnosis present

## 2017-01-18 DIAGNOSIS — M6281 Muscle weakness (generalized): Secondary | ICD-10-CM | POA: Diagnosis present

## 2017-01-18 DIAGNOSIS — I5032 Chronic diastolic (congestive) heart failure: Secondary | ICD-10-CM | POA: Diagnosis not present

## 2017-01-18 DIAGNOSIS — R05 Cough: Secondary | ICD-10-CM | POA: Diagnosis not present

## 2017-01-18 DIAGNOSIS — J449 Chronic obstructive pulmonary disease, unspecified: Secondary | ICD-10-CM | POA: Insufficient documentation

## 2017-01-18 DIAGNOSIS — J45909 Unspecified asthma, uncomplicated: Secondary | ICD-10-CM | POA: Diagnosis not present

## 2017-01-18 DIAGNOSIS — M25571 Pain in right ankle and joints of right foot: Secondary | ICD-10-CM | POA: Insufficient documentation

## 2017-01-18 DIAGNOSIS — Z87891 Personal history of nicotine dependence: Secondary | ICD-10-CM | POA: Insufficient documentation

## 2017-01-18 DIAGNOSIS — Z7982 Long term (current) use of aspirin: Secondary | ICD-10-CM | POA: Insufficient documentation

## 2017-01-18 DIAGNOSIS — M25661 Stiffness of right knee, not elsewhere classified: Secondary | ICD-10-CM | POA: Diagnosis present

## 2017-01-18 DIAGNOSIS — E1122 Type 2 diabetes mellitus with diabetic chronic kidney disease: Secondary | ICD-10-CM | POA: Insufficient documentation

## 2017-01-18 DIAGNOSIS — R0902 Hypoxemia: Secondary | ICD-10-CM | POA: Diagnosis not present

## 2017-01-18 DIAGNOSIS — R0602 Shortness of breath: Secondary | ICD-10-CM | POA: Diagnosis not present

## 2017-01-18 DIAGNOSIS — I130 Hypertensive heart and chronic kidney disease with heart failure and stage 1 through stage 4 chronic kidney disease, or unspecified chronic kidney disease: Secondary | ICD-10-CM | POA: Diagnosis not present

## 2017-01-18 DIAGNOSIS — N189 Chronic kidney disease, unspecified: Secondary | ICD-10-CM | POA: Diagnosis not present

## 2017-01-18 DIAGNOSIS — Z79899 Other long term (current) drug therapy: Secondary | ICD-10-CM | POA: Insufficient documentation

## 2017-01-18 DIAGNOSIS — M25671 Stiffness of right ankle, not elsewhere classified: Secondary | ICD-10-CM | POA: Diagnosis present

## 2017-01-18 DIAGNOSIS — R269 Unspecified abnormalities of gait and mobility: Secondary | ICD-10-CM | POA: Insufficient documentation

## 2017-01-18 DIAGNOSIS — J80 Acute respiratory distress syndrome: Secondary | ICD-10-CM | POA: Diagnosis not present

## 2017-01-18 NOTE — Therapy (Signed)
[facility], [place], [address] Phone: [phone]   Fax:  [phone]  Physical Therapy Treatment / Discharge summary  Patient Details  Name: Tiffany [name] MRN: [id number] Date of Birth: [birth year] Referring Provider: Frankey Shown MD   Encounter Date: 01/18/2017  PT End of Session - 01/18/17 1506    Visit Number  3    Number of Visits  17    Date for PT Re-Evaluation  02/14/17    Authorization Type  MCR: Kx mod by 15th visit, progress note by 10th visit    PT Start Time  1506    PT Stop Time  1534 shortened visit due to discharge    PT Time Calculation (min)  28 min    Activity Tolerance  Patient tolerated treatment well    Behavior During Therapy  [facility] for tasks assessed/performed       Past Medical History:  Diagnosis Date  . Anxiety   . Arthritis    "all over" (04/10/2016)  . Asthma   . Cardiac arrest ([place]) 09/08/2016   PEA  . Carotid artery stenosis    1-39% bilateral by dopplers 11/2016  . Chronic bronchitis ([place])   . Chronic diastolic (congestive) heart failure ([place])   . Chronic kidney disease    "I see a kidney dr." (04/10/2016)  . Cocaine abuse ([place])   . Complication of anesthesia    decreased bp, decreased heart rate  . Depression   . Disorder of nervous system   . Emphysema   . GERD (gastroesophageal reflux disease)   . Heart attack ([place]) 1980s  . History of blood transfusion 1994   "couldn't stop bleeding from my period"  . Hyperlipidemia LDL goal <70   . Hypertension   . Incontinence   . Manic depression ([place])   . On home oxygen therapy    "6L; 24/7" (04/10/2016)  . OSA on CPAP    "wear mask sometimes" (04/10/2016)  . Paranoid ([place])    "sometimes; I'm on RX for it" (04/10/2016)  . Pneumonia    "I've had it several times; haven't had it since 06/2015" (04/10/2016)  . Schizophrenia ([place])   . Seasonal allergies   . Seizures ([place])    "don't know what kind; last one was ~ 1 yr ago"  (04/10/2016)  . Sinus trouble   . Stroke [facility]) 1980s   denies residual on 04/10/2016  . Type II diabetes mellitus ([place])     Past Surgical History:  Procedure Laterality Date  . CESAREAN SECTION  1997  . HERNIA REPAIR    . IR CHOLANGIOGRAM EXISTING TUBE  07/20/2016  . IR PERC CHOLECYSTOSTOMY  05/10/2016  . IR RADIOLOGIST EVAL & MGMT  06/08/2016  . IR RADIOLOGIST EVAL & MGMT  06/29/2016  . IR SINUS/FIST TUBE CHK-NON GI  07/12/2016  . TIBIA IM NAIL INSERTION Right 07/12/2016   Procedure: INTRAMEDULLARY (IM) NAIL RIGHT TIBIA;  Surgeon: Leandrew Koyanagi, MD;  Location: [place];  Service: Orthopedics;  Laterality: Right;  . UMBILICAL HERNIA REPAIR  ~ 1963   "that's why I don't have a belly button"  . VAGINAL HYSTERECTOMY      Vitals:   01/18/17 1511  SpO2: (!) 86%    Subjective Assessment - 01/18/17 1511    Subjective  "I had a fall right before christmas and I went to the hospital and was admitted for low blood and low potassium"     Currently in Pain?  Yes  Pain Score  5     Pain Orientation  Right;Left    Pain Onset  More than a month ago    Pain Frequency  Constant    Aggravating Factors   stepping down and pulling the leg up.     Pain Relieving Factors  nothing         [facility] PT Assessment - 01/18/17 1514      Observation/Other Assessments   Focus on Therapeutic Outcomes (FOTO)   71% limited      AROM   Right Knee Extension  -5    Right Knee Flexion  113    Left Knee Extension  -6    Left Knee Flexion  114    Right Ankle Dorsiflexion  -2    Right Ankle Plantar Flexion  42    Right Ankle Inversion  18    Right Ankle Eversion  18      Strength   Right Knee Flexion  4-/5    Right Knee Extension  4-/5    Left Knee Flexion  4/5    Left Knee Extension  4/5    Right Ankle Dorsiflexion  4/5    Right Ankle Plantar Flexion  4/5    Right Ankle Inversion  3+/5    Right Ankle Eversion  3+/5                  [facility] Adult PT Treatment/Exercise - 01/18/17 1527       Knee/Hip Exercises: Seated   Heel Slides  2 sets;10 reps      Ankle Exercises: Stretches   Gastroc Stretch  2 reps;30 seconds;Other (comment) bil      Ankle Exercises: Seated   Heel Raises  10 reps    Toe Raise  10 reps             PT Education - 01/18/17 1532    Education provided  Yes    Education Details  reviewed and updated HEP. How to progress strength by increasing reps/ sets and promote / maintin current level of function. to take her time with walking/ standing to prevent/ reduce potential falls.     Person(s) Educated  Patient    Methods  Explanation;Verbal cues    Comprehension  Verbalized understanding;Verbal cues required       PT Short Term Goals - 01/18/17 1536      PT SHORT TERM GOAL #1   Title  pt to be I with inital HEP    Time  4    Period  Weeks    Status  Achieved      PT SHORT TERM GOAL #2   Title  pt to demo techniques to reduce pain and inflammation via RICE and HEP in the R knee/ ankle    Time  4    Period  Weeks    Status  Partially Met      PT SHORT TERM GOAL #3   Title  pt to be able to ambulate >/= 8 min with SPC with </= 7/10 pain for functional progression     Time  4    Period  Weeks    Status  Not Met        PT Long Term Goals - 01/18/17 1536      PT LONG TERM GOAL #1   Title  pt to increase R ankle DF / PF by >/= 6 degrees to promote ankle mobility and gait efficiency     Time  8    Period  Weeks    Status  Not Met      PT LONG TERM GOAL #2   Title  pt to increase ankle/ knee strength to >/= 4/5 with </=3/10 pain to promote ankle/ knee stability with prolonged walking/ standing activities.     Time  8    Period  Weeks    Status  Partially Met      PT LONG TERM GOAL #3   Title  pt to be able to walk for >/= 30 min with LRAD and pain </= 3/10 pain to promote functional endurance required for ADLs and community ambulation    Time  8    Period  Weeks    Status  Not Met      PT LONG TERM GOAL #4   Title  pt to  increase FOTO score to >/= 79% limited to demo improvement of function     Time  8    Period  Weeks    Status  Not Met      PT LONG TERM GOAL #5   Title  pt to be I with all HEP given as of last visit     Time  8    Period  Weeks    Status  Not Met            Plan - 01/18/17 1534    Clinical Impression Statement  pt reported that she needs to focus on her heart and that today will be her last day. she reported having a fall before christmas and was admitted to the hospital for low potassium. she has slightly improved her ankle mobility/ strengthy and reports she has been consistenty with her HEP. she met only STG #1 and partially met STG#2 and LTG #2, and reprots she is able to maintain her current function with her ankle and knee and will be discharged from PT.     PT Treatment/Interventions  ADLs/Self Care Home Management;Cryotherapy;Electrical Stimulation;Iontophoresis 48m/ml Dexamethasone;Moist Heat;Ultrasound;Balance training;Gait training;Stair training;Functional mobility training;Therapeutic exercise;Therapeutic activities;Manual techniques;Taping;Neuromuscular re-education;Patient/family education    PT Next Visit Plan  review/ update HEP, ankle /knee mobs (as tolerated), knee/ ankle strengthening/ stability (pt prefers seated exercise, would benefit from standing if willing), modalities PRN for pain    PT Home Exercise Plan  ankle Abc's, LAQ, calf stretching, seated heel slide,     Consulted and Agree with Plan of Care  Patient       Patient will benefit from skilled therapeutic intervention in order to improve the following deficits and impairments:  Abnormal gait, Obesity, Pain, Decreased strength, Decreased knowledge of use of DME, Postural dysfunction, Improper body mechanics, Difficulty walking, Decreased mobility, Decreased endurance, Decreased range of motion, Decreased safety awareness, Decreased balance, Decreased activity tolerance  Visit Diagnosis: Pain in right  ankle and joints of right foot  Stiffness of right ankle, not elsewhere classified  Muscle weakness (generalized)  Stiffness of right knee, not elsewhere classified  Chronic pain of right knee  Abnormality of gait     Problem List Patient Active Problem List   Diagnosis Date Noted  . Syncope 01/06/2017  . Hypomagnesemia   . Prolonged Q-T interval on ECG   . CKD (chronic kidney disease) 12/15/2016  . Carotid artery stenosis   . Right foot pain 11/15/2016  . Right carotid bruit 11/09/2016  . Hypokalemia   . Osteoarthritis 10/26/2016  . Dyspnea 10/07/2016  . Cardiac arrest ([place] 09/08/2016  . History of open reduction  and internal fixation (ORIF) procedure   . Closed displaced transverse fracture of shaft of right tibia   . Fall   . Overactive bladder 06/07/2016  . QT prolongation   . OSA and COPD overlap syndrome ([place])   . Generalized weakness   . Arthritis   . Bipolar I disorder ([place])   . Essential hypertension 03/22/2016  . Bipolar affective disorder, mixed, severe, with psychotic behavior ([place]) 11/28/2015  . Cocaine use disorder, severe, dependence ([place]) 11/28/2015  . Chronic diastolic congestive heart failure ([name])   . Chronic respiratory failure with hypoxia and hypercapnia ([place]) 06/22/2015  . Onychomycosis 01/21/2015  . Nasal congestion 12/17/2014  . Tobacco use disorder 07/22/2014  . COPD (chronic obstructive pulmonary disease) ([place]) 07/08/2014  . Knee pain, bilateral 01/22/2013  . Seizure ([place]) 01/04/2013  . Health care maintenance 11/25/2012  . History of kidney stones 06/18/2012  . Chronic pain syndrome 06/18/2012  . Allergic reaction 04/07/2012  . HSV infection 08/30/2011  . Dyslipidemia 04/24/2011  . Anemia 04/24/2011  . Diabetic neuropathy ([place]) 04/24/2011  . Obstructive sleep apnea 10/18/2010  . Asthma 10/18/2010  . Morbid obesity ([place]) 10/18/2010  . Type 2 diabetes mellitus ([place]) 10/18/2010   Starr Lake PT, DPT, LAT, ATC  01/18/17   3:41 PM      [facility] [address] [place], [place], [address] Phone: [phone]   Fax:  [phone]  Name: MURNA BACKER MRN: [id number] Date of Birth: 1960/05/10     PHYSICAL THERAPY DISCHARGE SUMMARY  Visits from Start of Care: 3  Current functional level related to goals / functional outcomes: See goals   Remaining deficits: Limited ankle mobility and strength with pain. Limited mobility and abnormal gait, pt's o2 sat drops with exercise requiring verbal cues for breathing properly.    Education / Equipment: HEP  Plan: Patient agrees to discharge.  Patient goals were not met. Patient is being discharged due to the patient's request.  ?????    Starr Lake PT, DPT, LAT, ATC  01/18/17  3:43 PM

## 2017-01-18 NOTE — ED Notes (Signed)
Bed: WA06 Expected date:  Expected time:  Means of arrival:  Comments: EMS SOB with O2 sat 80's

## 2017-01-18 NOTE — Telephone Encounter (Signed)
Called patient back to discuss and all questions answered.

## 2017-01-18 NOTE — Telephone Encounter (Signed)
Pt states that since leaving rehab this am she has "felt SOB and cant get my oxygen above 90%"  Denies issue with tank.  Advised to call EMS if she is having trouble breathing.  Pt agreeable.  Of note, pt does not sound more SOB than normal when in clinic.  Freada Twersky, [name], CMA

## 2017-01-18 NOTE — ED Triage Notes (Addendum)
Pt BIB [facility] from home. She has a hx of COPD and emphysema. She reports general malaise today. She wears 6L at all time. Denies N/V/D. Denies chest pain. 93% on her normal 6L. Also complaining of a runny nose today.

## 2017-01-19 ENCOUNTER — Emergency Department (HOSPITAL_COMMUNITY): Payer: Medicare HMO

## 2017-01-19 ENCOUNTER — Encounter: Payer: Self-pay | Admitting: Cardiology

## 2017-01-19 ENCOUNTER — Other Ambulatory Visit: Payer: Self-pay

## 2017-01-19 ENCOUNTER — Ambulatory Visit (INDEPENDENT_AMBULATORY_CARE_PROVIDER_SITE_OTHER): Payer: Medicare HMO | Admitting: Cardiology

## 2017-01-19 VITALS — BP 122/74 | HR 81 | Ht 64.0 in | Wt 298.0 lb

## 2017-01-19 DIAGNOSIS — R0602 Shortness of breath: Secondary | ICD-10-CM | POA: Diagnosis not present

## 2017-01-19 DIAGNOSIS — R0902 Hypoxemia: Secondary | ICD-10-CM | POA: Diagnosis not present

## 2017-01-19 DIAGNOSIS — R05 Cough: Secondary | ICD-10-CM | POA: Diagnosis not present

## 2017-01-19 DIAGNOSIS — R451 Restlessness and agitation: Secondary | ICD-10-CM | POA: Diagnosis not present

## 2017-01-19 DIAGNOSIS — I5022 Chronic systolic (congestive) heart failure: Secondary | ICD-10-CM | POA: Diagnosis not present

## 2017-01-19 LAB — URINALYSIS, ROUTINE W REFLEX MICROSCOPIC
Bacteria, UA: NONE SEEN
Bilirubin Urine: NEGATIVE
Glucose, UA: NEGATIVE mg/dL
Ketones, ur: NEGATIVE mg/dL
Nitrite: NEGATIVE
Protein, ur: 30 mg/dL — AB
Specific Gravity, Urine: 1.015 (ref 1.005–1.030)
pH: 5 (ref 5.0–8.0)

## 2017-01-19 MED ORDER — METOLAZONE 2.5 MG PO TABS: 2.5000 mg | ORAL_TABLET | ORAL | 3 refills | Status: DC

## 2017-01-19 NOTE — ED Notes (Signed)
Pt now complaining of a headache. Requesting medication. Will wait for provider to sign up for patient.

## 2017-01-19 NOTE — Progress Notes (Signed)
01/19/2017 Tiffany [name]   July 05, 1960  [id number]  Primary Physician Tiffany Bihari, MD Primary Cardiologist: Dr. Radford [name]   Reason for Visit/CC: Chronic diastolic HF  HPI:  57 y.o. female with a hx of asthma, COPD on chronic supplemental O2, chronic diastolic CHF, prior MI, HTN, hyperlipidemia, type 2 diabetes, history of cocaine abuse, CKD stage II,anemia, obesityand anxiety.  Her last 2D echo showed normal LVF with EF 60-65% with mild LAE. In October 2018, she was seen in clinic for weight gain and noted to be in acute on chronic diastolic HF. She has had issues with fluid retention and eats a poor diet, high in salt (eats out frequently, fast food and chinese). Zaroxolyn 2.5mg  Mon/Wed/Fri was added. She was seen back by Dr. Radford [name] for f/u and noted improvement in weight. Dr. Ardeen [name] is ~298 lb. She was also evaluated around that time for atypical chest pain. Was seen in the ED. troponin was negative and chest Ct also negative for PE. Her CP was felt to be musculoskeletal as she had reproducible symptoms with palpation.   [name] back for 3 month f/u. [name] doing well. No dyspnea or CP. She has been checking weight at home and it has been stable. Office weight today is 289 lb, c/w with previous weights. No LEE. She reports full med compliance. BP is well controlled.     No outpatient medications have been marked as taking for the 01/19/17 encounter (Office Visit) with Tiffany Pandy, PA-C.   Allergies  Allergen Reactions  . Hydrocodone-Acetaminophen Shortness Of Breath  . Hydroxyzine Anaphylaxis and Shortness Of Breath  . Latuda [Lurasidone Hcl] Anaphylaxis  . Codeine Nausea And Vomiting  . Sulfa Antibiotics Itching   Past Medical History:  Diagnosis Date  . Anxiety   . Arthritis    "all over" (04/10/2016)  . Asthma   . Cardiac arrest ([place]) 09/08/2016   PEA  . Carotid artery stenosis    1-39% bilateral by dopplers 11/2016  . Chronic bronchitis ([place])   . Chronic  diastolic (congestive) heart failure ([place])   . Chronic kidney disease    "I see a kidney dr." (04/10/2016)  . Cocaine abuse ([place])   . Complication of anesthesia    decreased bp, decreased heart rate  . Depression   . Disorder of nervous system   . Emphysema   . GERD (gastroesophageal reflux disease)   . Heart attack ([place]) 1980s  . History of blood transfusion 1994   "couldn't stop bleeding from my period"  . Hyperlipidemia LDL goal <70   . Hypertension   . Incontinence   . Manic depression ([place])   . On home oxygen therapy    "6L; 24/7" (04/10/2016)  . OSA on CPAP    "wear mask sometimes" (04/10/2016)  . Paranoid ([place])    "sometimes; I'm on RX for it" (04/10/2016)  . Pneumonia    "I've had it several times; haven't had it since 06/2015" (04/10/2016)  . Schizophrenia ([place])   . Seasonal allergies   . Seizures ([place])    "don't know what kind; last one was ~ 1 yr ago" (04/10/2016)  . Sinus trouble   . Stroke [facility]) 1980s   denies residual on 04/10/2016  . Type II diabetes mellitus ([facility])    Family History  Problem Relation Age of Onset  . Cancer Father        prostate  . Cancer Mother        lung  . Depression  Mother   . Depression Sister   . Anxiety disorder Sister   . Schizophrenia Sister   . Bipolar disorder Sister   . Depression Sister   . Depression Brother   . Heart failure Unknown        cousin   Past Surgical History:  Procedure Laterality Date  . CESAREAN SECTION  1997  . HERNIA REPAIR    . IR CHOLANGIOGRAM EXISTING TUBE  07/20/2016  . IR PERC CHOLECYSTOSTOMY  05/10/2016  . IR RADIOLOGIST EVAL & MGMT  06/08/2016  . IR RADIOLOGIST EVAL & MGMT  06/29/2016  . IR SINUS/FIST TUBE CHK-NON GI  07/12/2016  . TIBIA IM NAIL INSERTION Right 07/12/2016   Procedure: INTRAMEDULLARY (IM) NAIL RIGHT TIBIA;  Surgeon: Leandrew Koyanagi, MD;  Location: [facility];  Service: Orthopedics;  Laterality: Right;  . UMBILICAL HERNIA REPAIR  ~ 1963   "that's why I don't have a belly button"  . VAGINAL  HYSTERECTOMY     Social History   Socioeconomic History  . Marital status: Widowed    Spouse name: Not on file  . Number of children: 3  . Years of education: Not on file  . Highest education level: Not on file  Social Needs  . Financial resource strain: Not on file  . Food insecurity - worry: Not on file  . Food insecurity - inability: Not on file  . Transportation needs - medical: Not on file  . Transportation needs - non-medical: Not on file  Occupational History  . Occupation: disabled    Comment: factory production  Tobacco Use  . Smoking status: Former Smoker    Packs/day: 1.50    Years: 38.00    Pack years: 57.00    Types: Cigarettes    Start date: 03/13/1977    Last attempt to quit: 04/10/2016    Years since quitting: 0.7  . Smokeless tobacco: Never Used  Substance and Sexual Activity  . Alcohol use: No    Alcohol/week: 0.0 oz  . Drug use: No    Comment: 04/10/2016 "last used cocaine back in November 2017"  . Sexual activity: No    Birth control/protection: Surgical  Other Topics Concern  . Not on file  Social History Narrative   Has 1 son, Tiffany [name]   Lives with son and his boyfriend   Her house has ramps and handrails should she ever needs them.    Her mother lives down the street from her and is a good support person in addition to her son.   She drives herself, has private transportation.    Cocaine free since 02/24/16, smoke free since 04/10/16     Review of Systems: General: negative for chills, fever, night sweats or weight changes.  Cardiovascular: negative for chest pain, dyspnea on exertion, edema, orthopnea, palpitations, paroxysmal nocturnal dyspnea or shortness of breath Dermatological: negative for rash Respiratory: negative for cough or wheezing Urologic: negative for hematuria Abdominal: negative for nausea, vomiting, diarrhea, bright red blood per rectum, melena, or hematemesis Neurologic: negative for visual changes, syncope, or dizziness All  other systems reviewed and are otherwise negative except as noted above.   Physical Exam:  Blood pressure 122/74, pulse 81, height 5\' 4"  (1.626 m), weight 298 lb (135.2 kg), SpO2 96 %.  General appearance: alert, cooperative and no distress, obese  Neck: no carotid bruit and no JVD Lungs: clear to auscultation bilaterally Heart: regular rate and rhythm, S1, S2 normal, no murmur, click, rub or gallop Extremities: extremities normal, atraumatic,  no cyanosis or edema Pulses: 2+ and symmetric Skin: Skin color, texture, turgor normal. No rashes or lesions Neurologic: Grossly normal  EKG not performed -- personally reviewed   ASSESSMENT AND PLAN:   1. Chronic Diastolic HF: volume and weight stable. No increased dyspnea from baseline. Continue Torsemide and Metolazone 3x/week. Recent BMP 3 weeks ago showed normal renal function and K. BP is also stable. Continue daily weights. Pt advised to contact our office if steady weight gain. We discussed low sodium diet.   2.HTN: controlled on current regimen.   3. DM: followed by PCP.   4. COPD: on supplemental O2.   Follow-Up w/ Dr. Radford [name] in 6 months  Tiffany [name], MHS [facility] 01/19/2017 3:33 PM

## 2017-01-19 NOTE — ED Provider Notes (Signed)
[facility] DEPT Provider Note   CSN: [id number] Arrival date & time: 01/18/17  2317     History   Chief Complaint Chief Complaint  Patient presents with  . Malaise  . Nasal Congestion    HPI Tiffany [name] is a 57 y.o. female.  The history is provided by the patient and medical records.    57 year old female with history of anxiety, arthritis, asthma, former cardiac arrest, congestive heart failure, depression, GERD, hyperlipidemia, hypertension, chronic oxygen dependence, presenting to the ED feeling tired and fatigued.  States this is been going on since this morning.  States she has had a dry cough, no production of mucus.  She has not had any chest pain, fever, chills, or sweats.  No nausea, vomiting, diarrhea, or abdominal pain.  Has had a little bit of a runny nose today as well.  States at home she hooked herself up to her home pulse ox and noticed that her sats were low at 90.  States she moved around a little bit and they went up to 98 but dropped down back again to 90%.  States she got a little concerned about this so called EMS.  She denies any current chest pain or shortness of breath during my assessment.  Her oxygen saturation is 100% on her baseline 6 L throughout entire exam.  Patient states she thinks there may be something wrong with her oxygen tubing at home.  Past Medical History:  Diagnosis Date  . Anxiety   . Arthritis    "all over" (04/10/2016)  . Asthma   . Cardiac arrest ([place]) 09/08/2016   PEA  . Carotid artery stenosis    1-39% bilateral by dopplers 11/2016  . Chronic bronchitis ([place])   . Chronic diastolic (congestive) heart failure ([place])   . Chronic kidney disease    "I see a kidney dr." (04/10/2016)  . Cocaine abuse ([place])   . Complication of anesthesia    decreased bp, decreased heart rate  . Depression   . Disorder of nervous system   . Emphysema   . GERD (gastroesophageal reflux disease)   . Heart attack ([place])  1980s  . History of blood transfusion 1994   "couldn't stop bleeding from my period"  . Hyperlipidemia LDL goal <70   . Hypertension   . Incontinence   . Manic depression ([place])   . On home oxygen therapy    "6L; 24/7" (04/10/2016)  . OSA on CPAP    "wear mask sometimes" (04/10/2016)  . Paranoid ([place])    "sometimes; I'm on RX for it" (04/10/2016)  . Pneumonia    "I've had it several times; haven't had it since 06/2015" (04/10/2016)  . Schizophrenia ([place])   . Seasonal allergies   . Seizures ([place])    "don't know what kind; last one was ~ 1 yr ago" (04/10/2016)  . Sinus trouble   . Stroke [facility]) 1980s   denies residual on 04/10/2016  . Type II diabetes mellitus [facility])     Patient Active Problem List   Diagnosis Date Noted  . Syncope 01/06/2017  . Hypomagnesemia   . Prolonged Q-T interval on ECG   . CKD (chronic kidney disease) 12/15/2016  . Carotid artery stenosis   . Right foot pain 11/15/2016  . Right carotid bruit 11/09/2016  . Hypokalemia   . Osteoarthritis 10/26/2016  . Dyspnea 10/07/2016  . Cardiac arrest ([place]) 09/08/2016  . History of open reduction and internal fixation (ORIF) procedure   .  Closed displaced transverse fracture of shaft of right tibia   . Fall   . Overactive bladder 06/07/2016  . QT prolongation   . OSA and COPD overlap syndrome ([place])   . Generalized weakness   . Arthritis   . Bipolar I disorder ([place])   . Essential hypertension 03/22/2016  . Bipolar affective disorder, mixed, severe, with psychotic behavior ([place]) 11/28/2015  . Cocaine use disorder, severe, dependence ([place]) 11/28/2015  . Chronic diastolic congestive heart failure ([place])   . Chronic respiratory failure with hypoxia and hypercapnia ([place]) 06/22/2015  . Onychomycosis 01/21/2015  . Nasal congestion 12/17/2014  . Tobacco use disorder 07/22/2014  . COPD (chronic obstructive pulmonary disease) ([place]) 07/08/2014  . Knee pain, bilateral 01/22/2013  . Seizure ([place]) 01/04/2013  . Health care  maintenance 11/25/2012  . History of kidney stones 06/18/2012  . Chronic pain syndrome 06/18/2012  . Allergic reaction 04/07/2012  . HSV infection 08/30/2011  . Dyslipidemia 04/24/2011  . Anemia 04/24/2011  . Diabetic neuropathy ([place]) 04/24/2011  . Obstructive sleep apnea 10/18/2010  . Asthma 10/18/2010  . Morbid obesity ([place]) 10/18/2010  . Type 2 diabetes mellitus ([place]) 10/18/2010    Past Surgical History:  Procedure Laterality Date  . CESAREAN SECTION  1997  . HERNIA REPAIR    . IR CHOLANGIOGRAM EXISTING TUBE  07/20/2016  . IR PERC CHOLECYSTOSTOMY  05/10/2016  . IR RADIOLOGIST EVAL & MGMT  06/08/2016  . IR RADIOLOGIST EVAL & MGMT  06/29/2016  . IR SINUS/FIST TUBE CHK-NON GI  07/12/2016  . TIBIA IM NAIL INSERTION Right 07/12/2016   Procedure: INTRAMEDULLARY (IM) NAIL RIGHT TIBIA;  Surgeon: Leandrew Koyanagi, MD;  Location: [facility];  Service: Orthopedics;  Laterality: Right;  . UMBILICAL HERNIA REPAIR  ~ 1963   "that's why I don't have a belly button"  . VAGINAL HYSTERECTOMY      OB History    No data available       Home Medications    Prior to Admission medications   Medication Sig Start Date End Date Taking? Authorizing Provider  albuterol (PROVENTIL HFA;VENTOLIN HFA) 108 (90 Base) MCG/ACT inhaler Inhale 2 puffs every 6 (six) hours as needed into the lungs for wheezing or shortness of breath. 12/20/16   Mikell, Jeani Sow, MD  albuterol (PROVENTIL) (2.5 MG/3ML) 0.083% nebulizer solution Take 3 mLs (2.5 mg total) every 6 (six) hours as needed by nebulization for wheezing or shortness of breath. 11/21/16   Mikell, Jeani Sow, MD  amLODipine (NORVASC) 5 MG tablet Take 1 tablet (5 mg total) by mouth daily. 01/12/17   [name], MD  ARIPiprazole (ABILIFY) 5 MG tablet Take1 tab adily 12/11/16   Arfeen, Arlyce Harman, MD  aspirin 81 MG EC tablet Take 1 tablet (81 mg total) by mouth daily. Swallow whole. 06/13/16   Archie Patten, MD  atorvastatin (LIPITOR) 80 MG tablet Take 1  tablet (80 mg total) by mouth daily. 01/12/17   Mikell, Jeani Sow, MD  baclofen (LIORESAL) 10 MG tablet Take 10 mg by mouth 3 (three) times daily.    [provider]  benztropine (COGENTIN) 0.5 MG tablet Take 1 tablet (0.5 mg total) by mouth 2 (two) times daily morning and evening Patient not taking: Reported on 01/11/2017 12/26/16   Arfeen, Arlyce Harman, MD  BREO ELLIPTA 100-25 MCG/INH AEPB INHALE 1 PUFF EVERY DAY 12/13/16   Mikell, Jeani Sow, MD  Diclofenac Sodium 3 % GEL Use on area of the pain 12/15/16   Mikell, Asiyah  Meredeth Ide, MD  escitalopram (LEXAPRO) 20 MG tablet Take 1 tablet (20 mg total) by mouth daily. 01/11/17   Arfeen, Arlyce Harman, MD  flunisolide (NASALIDE) 25 MCG/ACT (0.025%) SOLN Place 2 sprays into the nose daily. 01/01/17   Mikell, Jeani Sow, MD  gabapentin (NEURONTIN) 600 MG tablet Take 1 tablet (600 mg total) by mouth 3 (three) times daily. 06/28/16   Archie Patten, MD  guaiFENesin (MUCINEX) 600 MG 12 hr tablet Take 1 tablet (600 mg total) by mouth 2 (two) times daily. 10/26/16   Mikell, Jeani Sow, MD  metFORMIN (GLUCOPHAGE-XR) 500 MG 24 hr tablet Take 1 tablet (500 mg total) by mouth 2 (two) times daily. 12/13/16   Mikell, Jeani Sow, MD  metolazone (ZAROXOLYN) 2.5 MG tablet Take 2.5 mg by mouth as needed (weight gain/SOB).    [provider]  mirtazapine (REMERON SOL-TAB) 45 MG disintegrating tablet Take 1 tablet (45 mg total) by mouth at bedtime. 01/08/17 01/08/18  Arfeen, Arlyce Harman, MD  montelukast (SINGULAIR) 10 MG tablet Take 1 tablet (10 mg total) by mouth at bedtime. 06/13/16   Archie Patten, MD  Multiple Vitamin (MULTIVITAMIN WITH MINERALS) TABS tablet Take 1 tablet by mouth daily.    [provider]  omeprazole (PRILOSEC) 40 MG capsule Take 1 capsule (40 mg total) by mouth 2 (two) times daily. in the morning and evebing 11/30/16   Mikell, Jeani Sow, MD  OXYGEN Inhale 6 L into the lungs daily.    [provider]  potassium  chloride (K-DUR) 10 MEQ tablet Take 6 tablets (60 mEq total) by mouth 2 (two) times daily. 12/19/16   Shirley Friar, PA-C  tiotropium (SPIRIVA HANDIHALER) 18 MCG inhalation capsule Place 1 capsule (18 mcg total) into inhaler and inhale daily. 06/13/16   Archie Patten, MD  torsemide (DEMADEX) 20 MG tablet Take 4 tablets (80 mg total) by mouth 2 (two) times daily. 10/31/16   Bhagat, Crista Luria, PA  traMADol (ULTRAM) 50 MG tablet Take 1 tablet (50 mg total) by mouth every 8 (eight) hours as needed. 01/10/17   Lovenia Kim, MD  Trolamine Salicylate 10 % LOTN Apply 1 patch topically 3 (three) times daily as needed. 10/26/16   [name], MD    Family History Family History  Problem Relation Age of Onset  . Cancer Father        prostate  . Cancer Mother        lung  . Depression Mother   . Depression Sister   . Anxiety disorder Sister   . Schizophrenia Sister   . Bipolar disorder Sister   . Depression Sister   . Depression Brother   . Heart failure Unknown        cousin    Social History Social History   Tobacco Use  . Smoking status: Former Smoker    Packs/day: 1.50    Years: 38.00    Pack years: 57.00    Types: Cigarettes    Start date: 03/13/1977    Last attempt to quit: 04/10/2016    Years since quitting: 0.7  . Smokeless tobacco: Never Used  Substance Use Topics  . Alcohol use: No    Alcohol/week: 0.0 oz  . Drug use: No    Comment: 04/10/2016 "last used cocaine back in November 2017"     Allergies   Hydrocodone-acetaminophen; Hydroxyzine; Latuda [lurasidone hcl]; Codeine; and Sulfa antibiotics   Review of Systems Review of Systems  Constitutional: Positive for fatigue.  Respiratory: Positive  for cough.   All other systems reviewed and are negative.    Physical Exam Updated Vital Signs BP (!) 193/115   Pulse 85   Temp 99.3 F (37.4 C) (Oral)   Resp 16   SpO2 98%   Physical Exam  Constitutional: [name] oriented to person, place,  and time. She appears well-developed and well-nourished.  Morbidly obese  HENT:  Head: Normocephalic and atraumatic.  Mouth/Throat: Oropharynx is clear and moist.  Nasal passages actually appear dry  Eyes: Conjunctivae and EOM are normal. Pupils are equal, round, and reactive to light.  Neck: Normal range of motion.  Cardiovascular: Normal rate, regular rhythm and normal heart sounds.  Pulmonary/Chest: Effort normal and breath sounds normal. No stridor. No respiratory distress.  6L O2 in use which is baseline, no distress, able to speak in full sentences without difficulty  Abdominal: Soft. Bowel sounds are normal. There is no tenderness. There is no rebound.  Musculoskeletal: Normal range of motion.  Neurological: [name] alert and oriented to person, place, and time.  Skin: Skin is warm and dry.  Psychiatric: She has a normal mood and affect.  Nursing note and vitals reviewed.    ED Treatments / Results  Labs (all labs ordered are listed, but only abnormal results are displayed) Labs Reviewed  URINALYSIS, ROUTINE W REFLEX MICROSCOPIC - Abnormal; Notable for the following components:      Result Value   Hgb urine dipstick SMALL (*)    Protein, ur 30 (*)    Leukocytes, UA TRACE (*)    Squamous Epithelial / LPF 0-5 (*)    All other components within normal limits  CBC WITH DIFFERENTIAL/PLATELET  BASIC METABOLIC PANEL  BRAIN NATRIURETIC PEPTIDE  MAGNESIUM  I-STAT TROPONIN, ED    EKG  EKG Interpretation None       Radiology Dg Chest 2 View  Result Date: 01/19/2017 CLINICAL DATA:  Shortness of breath with cough and nasal congestion for 1 week. EXAM: CHEST  2 VIEW COMPARISON:  01/06/2017 FINDINGS: Shallow inspiration. Cardiac enlargement. No vascular congestion, edema, or consolidation. No blunting of costophrenic angles. No pneumothorax. Mediastinal contours appear intact. IMPRESSION: Cardiac enlargement.  No evidence of active pulmonary disease. Electronically Signed   By:  Lucienne Capers M.D.   On: 01/19/2017 03:25    Procedures Procedures (including critical care time)  Medications Ordered in ED Medications - No data to display   Initial Impression / Assessment and Plan / ED Course  I have reviewed the triage vital signs and the nursing notes.  Pertinent labs & imaging results that were available during my care of the patient were reviewed by me and considered in my medical decision making (see chart for details).  57 year old female here with cough, nasal congestion, and fatigue.  States onset this morning.  She did have some abnormalities of her blood work during last admission in December including hypokalemia and anemia.  She did have this rechecked after discharge from hospital and was improved but states she never felt "back to normal".  She denies any chest pain or shortness of breath during my assessment.  Lungs are overall clear and she appears very stable on her baseline 6 L.  Her vital signs are stable.  Given her recent lab abnormalities, history of COPD, and current complaints will obtain screening labs, UA, chest x-ray.  4:19 AM Multiple attempts made to draw patient's blood, however difficult stick.  Patient reports she is always a difficult stick.  She refuses any  further attempts.  I have discussed with her that I do feel it would be pertinent to recheck her labs given her fairly significant hypokalemia during last admission on 01/06/17 as well as her anemia.  She understands this but does not want to be stuck anymore.  States she is feeling better at this time and actually just wants to go home.  Her chest x-ray is normal.  UA without any signs of infection.  Her vitals have remained stable here on her baseline 6 L.  I discussed these results with her, she feels reassured and still wants to go home.  She understands that electrolyte abnormality, renal impairment, anemia, or other abnormalities cannot be identified without blood draw.  States she  will follow-up with her primary care doctor about this. Patient is of sound mind and capable of making her own medical decisions.  We will not require her to sign out AMA, however have strongly encouraged her to follow-up with her primary care doctor for recheck of her blood work.  She understands to return here for any new/acute changes.  Final Clinical Impressions(s) / ED Diagnoses   Final diagnoses:  Other fatigue    ED Discharge Orders    None       Larene Pickett, PA-C 73/[id number]/87 [id number]    Delora Fuel, MD 57/26/20 [phone]

## 2017-01-19 NOTE — ED Notes (Signed)
Bed: WA09 Expected date:  Expected time:  Means of arrival:  Comments: Rm 6 

## 2017-01-19 NOTE — Discharge Instructions (Signed)
Your chest x-ray and urinalysis today were normal.  Since we were not able to draw your blood, I do recommend to have this rechecked with your primary care doctor. You can return here for any new/acute changes.

## 2017-01-19 NOTE — Patient Instructions (Addendum)
Medication Instructions:  Your physician has recommended you make the following change in your medication:  1- Take Metolazone 2.5 mg by mouth every Monday, Wednesday, and Friday 30 minutes prior to taking torsemide.   Labwork: NONE  Testing/Procedures: NONE  Follow-Up: Your physician wants you to follow-up in: 6 months with [name]. You will receive a reminder letter in the mail two months in advance. If you don't receive a letter, please call our office to schedule the follow-up appointment.  Please give our office a call if you have any weight gain that is 3 pounds or more over a 24 hour period or 5 pounds or more in one week.  If you need a refill on your cardiac medications before your next appointment, please call your pharmacy.

## 2017-01-19 NOTE — ED Notes (Signed)
Patient will not hold still for IV or blood draw and will

## 2017-01-20 NOTE — Telephone Encounter (Signed)
Do they mean Abilify?  Quinnie Barcelo

## 2017-01-21 DIAGNOSIS — J449 Chronic obstructive pulmonary disease, unspecified: Secondary | ICD-10-CM | POA: Diagnosis not present

## 2017-01-21 DIAGNOSIS — J45909 Unspecified asthma, uncomplicated: Secondary | ICD-10-CM | POA: Diagnosis not present

## 2017-01-21 DIAGNOSIS — J189 Pneumonia, unspecified organism: Secondary | ICD-10-CM | POA: Diagnosis not present

## 2017-01-21 DIAGNOSIS — M6281 Muscle weakness (generalized): Secondary | ICD-10-CM | POA: Diagnosis not present

## 2017-01-21 DIAGNOSIS — J440 Chronic obstructive pulmonary disease with acute lower respiratory infection: Secondary | ICD-10-CM | POA: Diagnosis not present

## 2017-01-21 DIAGNOSIS — R062 Wheezing: Secondary | ICD-10-CM | POA: Diagnosis not present

## 2017-01-21 DIAGNOSIS — R0602 Shortness of breath: Secondary | ICD-10-CM | POA: Diagnosis not present

## 2017-01-21 DIAGNOSIS — G4733 Obstructive sleep apnea (adult) (pediatric): Secondary | ICD-10-CM | POA: Diagnosis not present

## 2017-01-21 DIAGNOSIS — I509 Heart failure, unspecified: Secondary | ICD-10-CM | POA: Diagnosis not present

## 2017-01-22 NOTE — Telephone Encounter (Signed)
Given his heart history would avoid Abilify

## 2017-01-22 NOTE — Telephone Encounter (Signed)
Left detailed message on voicemail (DPR on file) of Dr. Theodosia Blender recommendation to avoid Abilify due to heart history, and call back with any questions or concerns.

## 2017-01-22 NOTE — Telephone Encounter (Signed)
Correction Abilify 5 mg at bedtime.

## 2017-01-23 DIAGNOSIS — R55 Syncope and collapse: Secondary | ICD-10-CM | POA: Diagnosis not present

## 2017-01-23 DIAGNOSIS — F319 Bipolar disorder, unspecified: Secondary | ICD-10-CM | POA: Diagnosis not present

## 2017-01-23 DIAGNOSIS — D631 Anemia in chronic kidney disease: Secondary | ICD-10-CM | POA: Diagnosis not present

## 2017-01-23 DIAGNOSIS — N183 Chronic kidney disease, stage 3 (moderate): Secondary | ICD-10-CM | POA: Diagnosis not present

## 2017-01-23 DIAGNOSIS — I5032 Chronic diastolic (congestive) heart failure: Secondary | ICD-10-CM | POA: Diagnosis not present

## 2017-01-23 DIAGNOSIS — F209 Schizophrenia, unspecified: Secondary | ICD-10-CM | POA: Diagnosis not present

## 2017-01-23 DIAGNOSIS — J449 Chronic obstructive pulmonary disease, unspecified: Secondary | ICD-10-CM | POA: Diagnosis not present

## 2017-01-23 DIAGNOSIS — I130 Hypertensive heart and chronic kidney disease with heart failure and stage 1 through stage 4 chronic kidney disease, or unspecified chronic kidney disease: Secondary | ICD-10-CM | POA: Diagnosis not present

## 2017-01-23 DIAGNOSIS — E1122 Type 2 diabetes mellitus with diabetic chronic kidney disease: Secondary | ICD-10-CM | POA: Diagnosis not present

## 2017-01-24 DIAGNOSIS — R062 Wheezing: Secondary | ICD-10-CM | POA: Diagnosis not present

## 2017-01-24 DIAGNOSIS — G4733 Obstructive sleep apnea (adult) (pediatric): Secondary | ICD-10-CM | POA: Diagnosis not present

## 2017-01-24 DIAGNOSIS — J440 Chronic obstructive pulmonary disease with acute lower respiratory infection: Secondary | ICD-10-CM | POA: Diagnosis not present

## 2017-01-24 DIAGNOSIS — J45909 Unspecified asthma, uncomplicated: Secondary | ICD-10-CM | POA: Diagnosis not present

## 2017-01-24 DIAGNOSIS — J189 Pneumonia, unspecified organism: Secondary | ICD-10-CM | POA: Diagnosis not present

## 2017-01-24 DIAGNOSIS — R0602 Shortness of breath: Secondary | ICD-10-CM | POA: Diagnosis not present

## 2017-01-25 ENCOUNTER — Telehealth: Payer: Self-pay | Admitting: Emergency Medicine

## 2017-01-25 ENCOUNTER — Encounter (HOSPITAL_COMMUNITY): Payer: Self-pay

## 2017-01-25 ENCOUNTER — Ambulatory Visit (HOSPITAL_COMMUNITY)
Admission: RE | Admit: 2017-01-25 | Discharge: 2017-01-25 | Disposition: A | Discharge status: Home / Self Care | Payer: Medicare HMO | Source: Ambulatory Visit | Attending: Internal Medicine | Admitting: Internal Medicine

## 2017-01-25 VITALS — BP 138/92 | HR 95 | Wt 292.6 lb

## 2017-01-25 DIAGNOSIS — Z9981 Dependence on supplemental oxygen: Secondary | ICD-10-CM | POA: Insufficient documentation

## 2017-01-25 DIAGNOSIS — E1122 Type 2 diabetes mellitus with diabetic chronic kidney disease: Secondary | ICD-10-CM | POA: Diagnosis not present

## 2017-01-25 DIAGNOSIS — I6529 Occlusion and stenosis of unspecified carotid artery: Secondary | ICD-10-CM | POA: Diagnosis not present

## 2017-01-25 DIAGNOSIS — Z7982 Long term (current) use of aspirin: Secondary | ICD-10-CM | POA: Diagnosis not present

## 2017-01-25 DIAGNOSIS — I130 Hypertensive heart and chronic kidney disease with heart failure and stage 1 through stage 4 chronic kidney disease, or unspecified chronic kidney disease: Secondary | ICD-10-CM | POA: Diagnosis present

## 2017-01-25 DIAGNOSIS — I5032 Chronic diastolic (congestive) heart failure: Secondary | ICD-10-CM | POA: Diagnosis present

## 2017-01-25 DIAGNOSIS — Z8674 Personal history of sudden cardiac arrest: Secondary | ICD-10-CM | POA: Diagnosis not present

## 2017-01-25 DIAGNOSIS — F329 Major depressive disorder, single episode, unspecified: Secondary | ICD-10-CM | POA: Diagnosis not present

## 2017-01-25 DIAGNOSIS — G4733 Obstructive sleep apnea (adult) (pediatric): Secondary | ICD-10-CM | POA: Diagnosis not present

## 2017-01-25 DIAGNOSIS — Z87891 Personal history of nicotine dependence: Secondary | ICD-10-CM | POA: Insufficient documentation

## 2017-01-25 DIAGNOSIS — J449 Chronic obstructive pulmonary disease, unspecified: Secondary | ICD-10-CM | POA: Insufficient documentation

## 2017-01-25 DIAGNOSIS — Z7984 Long term (current) use of oral hypoglycemic drugs: Secondary | ICD-10-CM | POA: Diagnosis not present

## 2017-01-25 DIAGNOSIS — F1410 Cocaine abuse, uncomplicated: Secondary | ICD-10-CM | POA: Insufficient documentation

## 2017-01-25 DIAGNOSIS — F209 Schizophrenia, unspecified: Secondary | ICD-10-CM | POA: Diagnosis not present

## 2017-01-25 DIAGNOSIS — I10 Essential (primary) hypertension: Secondary | ICD-10-CM

## 2017-01-25 DIAGNOSIS — K219 Gastro-esophageal reflux disease without esophagitis: Secondary | ICD-10-CM | POA: Diagnosis not present

## 2017-01-25 DIAGNOSIS — I252 Old myocardial infarction: Secondary | ICD-10-CM | POA: Diagnosis not present

## 2017-01-25 DIAGNOSIS — Z79899 Other long term (current) drug therapy: Secondary | ICD-10-CM | POA: Insufficient documentation

## 2017-01-25 DIAGNOSIS — F419 Anxiety disorder, unspecified: Secondary | ICD-10-CM | POA: Diagnosis not present

## 2017-01-25 DIAGNOSIS — N183 Chronic kidney disease, stage 3 (moderate): Secondary | ICD-10-CM | POA: Insufficient documentation

## 2017-01-25 DIAGNOSIS — R0602 Shortness of breath: Secondary | ICD-10-CM | POA: Diagnosis not present

## 2017-01-25 DIAGNOSIS — J42 Unspecified chronic bronchitis: Secondary | ICD-10-CM

## 2017-01-25 DIAGNOSIS — Z8673 Personal history of transient ischemic attack (TIA), and cerebral infarction without residual deficits: Secondary | ICD-10-CM | POA: Insufficient documentation

## 2017-01-25 DIAGNOSIS — E785 Hyperlipidemia, unspecified: Secondary | ICD-10-CM | POA: Diagnosis not present

## 2017-01-25 LAB — BASIC METABOLIC PANEL
Anion gap: 14 (ref 5–15)
BUN: 21 mg/dL — ABNORMAL HIGH (ref 6–20)
CO2: 35 mmol/L — ABNORMAL HIGH (ref 22–32)
Calcium: 9.5 mg/dL (ref 8.9–10.3)
Chloride: 92 mmol/L — ABNORMAL LOW (ref 101–111)
Creatinine, Ser: 1.55 mg/dL — ABNORMAL HIGH (ref 0.44–1.00)
GFR calc Af Amer: 42 mL/min — ABNORMAL LOW (ref >60)
GFR calc non Af Amer: 36 mL/min — ABNORMAL LOW (ref >60)
Glucose, Bld: 90 mg/dL (ref 65–99)
Potassium: 3 mmol/L — ABNORMAL LOW (ref 3.5–5.1)
Sodium: 141 mmol/L (ref 135–145)

## 2017-01-25 MED ORDER — TORSEMIDE 20 MG PO TABS: 80.0000 mg | ORAL_TABLET | Freq: Two times a day (BID) | ORAL | 6 refills | Status: DC

## 2017-01-25 NOTE — Telephone Encounter (Signed)
Spoke with pt, aware of recs.  Pt did not want to wait until next available appt with RB, so scheduled with NP.  Nothing further needed.

## 2017-01-25 NOTE — Telephone Encounter (Signed)
Like to see her in the office.  Please set her up for an office visit with me  I reviewed her office notes from cardiology today and there was no evidence for an acute exacerbation.  Also I see that they increase her diuretic regimen slightly

## 2017-01-25 NOTE — Telephone Encounter (Signed)
Called and spoke with pt.  Pt reports of increased sob. Pt states O2 level drops to 84% on 6L with exertion. Pt states O2 levels do recover to 93-94% with rest x1w. Denies additional symptoms- cough, wheezing or chest discomfort.   RB please advise. Thanks.

## 2017-01-25 NOTE — Patient Instructions (Signed)
CHANGE Torsemide to 80 mg (4 tabs) twice daily every day.  Routine lab work today. Will notify you of abnormal results, otherwise no news is good news!  Follow up 6 weeks with Tiffany Kilts PA-C.  Take all medication as prescribed the day of your appointment. Bring all medications with you to your appointment.  Do the following things EVERYDAY: 1) Weigh yourself in the morning before breakfast. Write it down and keep it in a log. 2) Take your medicines as prescribed 3) Eat low salt foods-Limit salt (sodium) to 2000 mg per day.  4) Stay as active as you can everyday 5) Limit all fluids for the day to less than 2 liters

## 2017-01-25 NOTE — Progress Notes (Signed)
Advanced Heart Failure Clinic Note   Primary Care: Asiyah Cletis Media, MD         Primary Cardiologist: [name]  HPI:  Tiffany [name] is a 57 y.o. female with a hx of asthma, COPD on chronic supplemental O2, chronic diastolic CHF, prior MI, HTN, hyperlipidemia, type 2 diabetes, history of cocaine abuse, CKD stage II,anemiaand anxiety.   Last seen in [facility] office 11/09/16. Visit notable for marked dietary non-compliance and fluid retention.   Seen by PCP 12/01/16 and Metolazone cut back with AKI.   Pt presents today for follow up. Has been feeling bad since last week. Seen at [facility] and metolazone added, but torsemide cut back. (Held on metolazone nights).  She continues to eat fast food most days, but usually eats one meal a day.   She denies lightheadedness or dizziness.  She doesn't feel like her UOP is as stable. Uses a wheel chair.  States her O2 say ranges from 86-95 at home with exertion.   Echo 10/30/16 with LVEF 60-65%, mild LAE.   Review of systems complete and found to be negative unless listed in HPI.    Past Medical History:  Diagnosis Date  . Anxiety   . Arthritis    "all over" (04/10/2016)  . Asthma   . Cardiac arrest ([place]) 09/08/2016   PEA  . Carotid artery stenosis    1-39% bilateral by dopplers 11/2016  . Chronic bronchitis ([place])   . Chronic diastolic (congestive) heart failure ([place])   . Chronic kidney disease    "I see a kidney dr." (04/10/2016)  . Cocaine abuse ([place])   . Complication of anesthesia    decreased bp, decreased heart rate  . Depression   . Disorder of nervous system   . Emphysema   . GERD (gastroesophageal reflux disease)   . Heart attack ([place]) 1980s  . History of blood transfusion 1994   "couldn't stop bleeding from my period"  . Hyperlipidemia LDL goal <70   . Hypertension   . Incontinence   . Manic depression ([place])   . On home oxygen therapy    "6L; 24/7" (04/10/2016)  . OSA on CPAP    "wear mask sometimes" (04/10/2016)  .  Paranoid ([place])    "sometimes; I'm on RX for it" (04/10/2016)  . Pneumonia    "I've had it several times; haven't had it since 06/2015" (04/10/2016)  . Schizophrenia ([place])   . Seasonal allergies   . Seizures ([place])    "don't know what kind; last one was ~ 1 yr ago" (04/10/2016)  . Sinus trouble   . Stroke [facility]) 1980s   denies residual on 04/10/2016  . Type II diabetes mellitus ([place])     Current Outpatient Medications  Medication Sig Dispense Refill  . albuterol (PROVENTIL HFA;VENTOLIN HFA) 108 (90 Base) MCG/ACT inhaler Inhale 2 puffs every 6 (six) hours as needed into the lungs for wheezing or shortness of breath. 18 Inhaler 0  . albuterol (PROVENTIL) (2.5 MG/3ML) 0.083% nebulizer solution Take 3 mLs (2.5 mg total) every 6 (six) hours as needed by nebulization for wheezing or shortness of breath. 150 mL 1  . amLODipine (NORVASC) 5 MG tablet Take 1 tablet (5 mg total) by mouth daily. 90 tablet 0  . ARIPiprazole (ABILIFY) 5 MG tablet Take1 tab adily (Patient taking differently: Take 5 mg by mouth daily. Take1 tab adily) 30 tablet 1  . aspirin 81 MG EC tablet Take 1 tablet (81 mg total) by mouth daily. Swallow  whole. 90 tablet 0  . atorvastatin (LIPITOR) 80 MG tablet Take 1 tablet (80 mg total) by mouth daily. 60 tablet 3  . benztropine (COGENTIN) 0.5 MG tablet Take 1 tablet (0.5 mg total) by mouth 2 (two) times daily morning and evening (Patient not taking: Reported on 01/11/2017) 60 tablet 0  . BREO ELLIPTA 100-25 MCG/INH AEPB INHALE 1 PUFF EVERY DAY 60 each 2  . Diclofenac Sodium 3 % GEL Use on area of the pain 100 g 0  . escitalopram (LEXAPRO) 20 MG tablet Take 1 tablet (20 mg total) by mouth daily. 90 tablet 0  . flunisolide (NASALIDE) 25 MCG/ACT (0.025%) SOLN Place 2 sprays into the nose daily. 1 Bottle 2  . gabapentin (NEURONTIN) 600 MG tablet Take 1 tablet (600 mg total) by mouth 3 (three) times daily. 270 tablet 0  . guaiFENesin (MUCINEX) 600 MG 12 hr tablet Take 1 tablet (600 mg total) by  mouth 2 (two) times daily. 30 tablet 0  . metFORMIN (GLUCOPHAGE-XR) 500 MG 24 hr tablet Take 1 tablet (500 mg total) by mouth 2 (two) times daily. 180 tablet 0  . metolazone (ZAROXOLYN) 2.5 MG tablet Take 1 tablet (2.5 mg total) by mouth every Monday, Wednesday, and Friday. Take 30 mins prior to taking Torsemide 45 tablet 3  . mirtazapine (REMERON SOL-TAB) 45 MG disintegrating tablet Take 1 tablet (45 mg total) by mouth at bedtime. 90 tablet 0  . montelukast (SINGULAIR) 10 MG tablet Take 1 tablet (10 mg total) by mouth at bedtime. 90 tablet 1  . Multiple Vitamin (MULTIVITAMIN WITH MINERALS) TABS tablet Take 1 tablet by mouth daily.    [name] omeprazole (PRILOSEC) 40 MG capsule Take 1 capsule (40 mg total) by mouth 2 (two) times daily. in the morning and evebing 56 capsule 2  . OXYGEN Inhale 6 L into the lungs daily.    . potassium chloride (K-DUR) 10 MEQ tablet Take 6 tablets (60 mEq total) by mouth 2 (two) times daily. 360 tablet 6  . tiotropium (SPIRIVA HANDIHALER) 18 MCG inhalation capsule Place 1 capsule (18 mcg total) into inhaler and inhale daily. 90 capsule 1  . torsemide (DEMADEX) 20 MG tablet Take 4 tablets (80 mg total) by mouth 2 (two) times daily. 720 tablet 3  . traMADol (ULTRAM) 50 MG tablet Take 1 tablet (50 mg total) by mouth every 8 (eight) hours as needed. (Patient not taking: Reported on 01/19/2017) 30 tablet 0  . Trolamine Salicylate 10 % LOTN Apply 1 patch topically 3 (three) times daily as needed. (Patient not taking: Reported on 01/19/2017) 141 g 2   No current facility-administered medications for this encounter.    Allergies  Allergen Reactions  . Hydrocodone-Acetaminophen Shortness Of Breath  . Hydroxyzine Anaphylaxis and Shortness Of Breath  . Latuda [Lurasidone Hcl] Anaphylaxis  . Codeine Nausea And Vomiting  . Sulfa Antibiotics Itching   Social History   Socioeconomic History  . Marital status: Widowed    Spouse name: Not on file  . Number of children: 3  . Years of  education: Not on file  . Highest education level: Not on file  Social Needs  . Financial resource strain: Not on file  . Food insecurity - worry: Not on file  . Food insecurity - inability: Not on file  . Transportation needs - medical: Not on file  . Transportation needs - non-medical: Not on file  Occupational History  . Occupation: disabled    Comment: factory production  Tobacco Use  .  Smoking status: Former Smoker    Packs/day: 1.50    Years: 38.00    Pack years: 57.00    Types: Cigarettes    Start date: 03/13/1977    Last attempt to quit: 04/10/2016    Years since quitting: 0.7  . Smokeless tobacco: Never Used  Substance and Sexual Activity  . Alcohol use: No    Alcohol/week: 0.0 oz  . Drug use: No    Comment: 04/10/2016 "last used cocaine back in November 2017"  . Sexual activity: No    Birth control/protection: Surgical  Other Topics Concern  . Not on file  Social History Narrative   Has 1 son, [name]   Lives with son and his boyfriend   Her house has ramps and handrails should she ever needs them.    Her mother lives down the street from her and is a good support person in addition to her son.   She drives herself, has private transportation.    Cocaine free since 02/24/16, smoke free since 04/10/16   Family History  Problem Relation Age of Onset  . Cancer Father        prostate  . Cancer Mother        lung  . Depression Mother   . Depression Sister   . Anxiety disorder Sister   . Schizophrenia Sister   . Bipolar disorder Sister   . Depression Sister   . Depression Brother   . Heart failure Unknown        cousin   Vitals:   01/25/17 1516  BP: (!) 138/92  Pulse: 95  SpO2: (!) 85%  Weight: 292 lb 9.6 oz (132.7 kg)   Wt Readings from Last 3 Encounters:  01/25/17 292 lb 9.6 oz (132.7 kg)  01/19/17 298 lb (135.2 kg)  01/10/17 292 lb 3.2 oz (132.5 kg)    PHYSICAL EXAM: General: Obese. NAD. Arrived in [facility]. O2 via .  HEENT: Normal Neck: Supple. JVP  very difficult due to body habitus. Carotids 2+ bilat; no bruits. No thyromegaly or nodule noted. Cor: PMI nondisplaced. RRR, No M/G/R noted Lungs: Distant. No wheeze or crackles heard.  Abdomen: Obese, soft, non-tender, non-distended, no HSM. No bruits or masses. +BS  Extremities: No cyanosis, clubbing, or rash. R and LLE no edema. Neuro: Alert & orientedx3, cranial nerves grossly intact. moves all 4 extremities w/o difficulty. Affect pleasant   ASSESSMENT & PLAN:  1. Chronic diastolic CHF - Echo 92/3300 LVEF 60-65% - Volume status very difficult on exam.  - Continue torsemide 80 mg BID. Add back the evening doses for metolazone days.  - Continue metolazone 2.5 mg M-W-F. Encouraged patient to limit to twice weekly, and she refused. Educated on risk of worsening renal function.  - Reinforced fluid restriction to < 2 L daily, sodium restriction to less than 2000 mg daily, and the importance of daily weights.   2. HTN - Mildly elevated. Will leave meds the same with additional diuresis.  - Continue amlodipine 5 mg daily.  3. COPD on chronic home O2 - States she has increased O2 demand. De-satting into upper 80s with exertion on 6L.  - Should follow up with Dr. Lamonte Sakai.  No s/s of AECOPD at this time.  - Continue chronic O2  4. Morbid obesity - Stressed importance of weight loss, including increasing activity and portion control. No change.  5. CKD stage III - BMET today.  - She establishes with Renal in February. Encouraged to keep appointment and stop rescheduling.  6. DM2 - Per PCP.   7. R carotid bruit - 1-39% bilateral by dopplers 11/2016.  - Per [name]. No change.   Very difficult volume status, and severe COPD complicates picture. Should see Dr. Lamonte Sakai for O2 titration.   Shirley Friar, PA-C 01/25/17   Greater than 50% of the 25 minute visit was spent in counseling/coordination of care regarding disease state education, salt/fluid restriction, sliding scale  diuretics, and medication compliance.

## 2017-01-26 ENCOUNTER — Telehealth (HOSPITAL_COMMUNITY): Payer: Self-pay | Admitting: Cardiology

## 2017-01-26 DIAGNOSIS — E1122 Type 2 diabetes mellitus with diabetic chronic kidney disease: Secondary | ICD-10-CM | POA: Diagnosis not present

## 2017-01-26 DIAGNOSIS — R55 Syncope and collapse: Secondary | ICD-10-CM | POA: Diagnosis not present

## 2017-01-26 DIAGNOSIS — D631 Anemia in chronic kidney disease: Secondary | ICD-10-CM | POA: Diagnosis not present

## 2017-01-26 DIAGNOSIS — N183 Chronic kidney disease, stage 3 (moderate): Secondary | ICD-10-CM | POA: Diagnosis not present

## 2017-01-26 DIAGNOSIS — J449 Chronic obstructive pulmonary disease, unspecified: Secondary | ICD-10-CM | POA: Diagnosis not present

## 2017-01-26 DIAGNOSIS — F209 Schizophrenia, unspecified: Secondary | ICD-10-CM | POA: Diagnosis not present

## 2017-01-26 DIAGNOSIS — I130 Hypertensive heart and chronic kidney disease with heart failure and stage 1 through stage 4 chronic kidney disease, or unspecified chronic kidney disease: Secondary | ICD-10-CM | POA: Diagnosis not present

## 2017-01-26 DIAGNOSIS — I5032 Chronic diastolic (congestive) heart failure: Secondary | ICD-10-CM | POA: Diagnosis not present

## 2017-01-26 DIAGNOSIS — F319 Bipolar disorder, unspecified: Secondary | ICD-10-CM | POA: Diagnosis not present

## 2017-01-26 MED ORDER — POTASSIUM CHLORIDE ER 10 MEQ PO TBCR: EXTENDED_RELEASE_TABLET | ORAL | 6 refills | Status: DC

## 2017-01-26 NOTE — Telephone Encounter (Signed)
-----   Message from Shirley Friar, PA-C sent at 01/26/2017  7:34 AM EST ----- Needs to be sure she is taking 40 meq of Extra potassium on metolazone days (M/W/F)   Beryle Beams" [place], PA-C 01/26/2017 7:34 AM

## 2017-01-26 NOTE — Telephone Encounter (Signed)
Patient aware. And patient voiced understanding

## 2017-01-29 ENCOUNTER — Other Ambulatory Visit: Payer: Self-pay | Admitting: Internal Medicine

## 2017-01-29 DIAGNOSIS — J45909 Unspecified asthma, uncomplicated: Secondary | ICD-10-CM

## 2017-01-29 MED ORDER — MONTELUKAST SODIUM 10 MG PO TABS: 10.0000 mg | ORAL_TABLET | Freq: Every day | ORAL | 1 refills | Status: DC

## 2017-01-30 ENCOUNTER — Encounter: Payer: Self-pay | Admitting: Adult Health

## 2017-01-30 ENCOUNTER — Ambulatory Visit (INDEPENDENT_AMBULATORY_CARE_PROVIDER_SITE_OTHER): Payer: Medicare HMO | Admitting: Adult Health

## 2017-01-30 DIAGNOSIS — J9611 Chronic respiratory failure with hypoxia: Secondary | ICD-10-CM

## 2017-01-30 DIAGNOSIS — J9612 Chronic respiratory failure with hypercapnia: Secondary | ICD-10-CM

## 2017-01-30 DIAGNOSIS — J42 Unspecified chronic bronchitis: Secondary | ICD-10-CM | POA: Diagnosis not present

## 2017-01-30 NOTE — Assessment & Plan Note (Signed)
Hypoxic and hypercarbic respiratory failure with underlying OSA and OHS.  Patient will continue on trilogy.  She will continue on oxygen at 6 L.  Patient has increased mobility risk for falls due to weakness deconditioning morbid obesity and hypoxemia with mobility.  [name] for a power mobility device to improve functionality and safety.  Plan  Patient Instructions  Continue on BREO 1 puff daily .  Continue on Spiriva daily .  Continue on Oxygen 6l/m .  Continue on Trilogy Vent At bedtime  .  Order for power mobility device .  Follow up with Dr. Lamonte Sakai  In 2 months  and As needed   Please contact office for sooner follow up if symptoms do not improve or worsen or seek emergency care

## 2017-01-30 NOTE — Progress Notes (Signed)
@Patient  ID: Tiffany [name], female    DOB: [birth year], 57 y.o.   MRN: [id number]  Chief Complaint  Patient presents with  . Follow-up    COPD     Referring provider: [name], MD  HPI: 57 year old former smoker quit March 2018 followed for COPD, asthma , OSA/OHS and chronic hypoxic respiratory failure on oxygen 6l/m and Trilogy Vent at bedtime  Patient has history of schizophrenia, polysubstance abuse with cocaine/THC   TEST  04/2016 FEV1 53%.  Echo 08/2016 EF nml , Gr 1 DD , PAP nml .  Sleep study done December 20, 2016 done on 6 L oxygen, AHI 5.5 with O2 saturation at 97%..  Was recommended to continue on trilogy home vent with supplemental oxygen   01/30/2017  Follow up : COPD , OSA\OHS on trilogy home vent, chronic hypoxic respiratory failure on oxygen 6 L Patient returns for a follow-up.  Patient says she is working with home health physical therapy.  During her therapy sessions her O2 saturation does not drop down into the upper 80s on walking more than 25 feet on 6 L.  She does have some balance issues and is prone to falling.  She needs to be stated to do house activities.  Patient has been recommended due to her safety concerns to use a power mobility device as patient is at a increased fall risk with lower oxygen levels during mobility. Patient says that her breathing has been doing better but she still gets short of breath with minimal activity.  [name] not short of breath at rest on 6 L. She remains on trilogy home vent this gentleman at night with 6 L of oxygen.  Says she has been sleeping well.  She wears this every night and does not miss any she denies any increased cough congestion or increased leg swelling.  Says she has been trying to work on weight loss.. Weight is down 8 pounds over the last month. . She remains on Brio and Spiriva.    Allergies  Allergen Reactions  . Hydrocodone-Acetaminophen Shortness Of Breath  . Hydroxyzine Anaphylaxis and  Shortness Of Breath  . Latuda [Lurasidone Hcl] Anaphylaxis  . Codeine Nausea And Vomiting  . Sulfa Antibiotics Itching    Immunization History  Administered Date(s) Administered  . Influenza,inj,Quad PF,6+ Mos 09/19/2013  . Pneumococcal Polysaccharide-23 09/19/2013  . Tdap 09/22/2015    Past Medical History:  Diagnosis Date  . Anxiety   . Arthritis    "all over" (04/10/2016)  . Asthma   . Cardiac arrest ([place]) 09/08/2016   PEA  . Carotid artery stenosis    1-39% bilateral by dopplers 11/2016  . Chronic bronchitis ([place])   . Chronic diastolic (congestive) heart failure ([place])   . Chronic kidney disease    "I see a kidney dr." (04/10/2016)  . Cocaine abuse ([place])   . Complication of anesthesia    decreased bp, decreased heart rate  . Depression   . Disorder of nervous system   . Emphysema   . GERD (gastroesophageal reflux disease)   . Heart attack ([place]) 1980s  . History of blood transfusion 1994   "couldn't stop bleeding from my period"  . Hyperlipidemia LDL goal <70   . Hypertension   . Incontinence   . Manic depression ([place])   . On home oxygen therapy    "6L; 24/7" (04/10/2016)  . OSA on CPAP    "wear mask sometimes" (04/10/2016)  . Paranoid ([place])    "  sometimes; I'm on RX for it" (04/10/2016)  . Pneumonia    "I've had it several times; haven't had it since 06/2015" (04/10/2016)  . Schizophrenia ([place])   . Seasonal allergies   . Seizures ([place])    "don't know what kind; last one was ~ 1 yr ago" (04/10/2016)  . Sinus trouble   . Stroke [facility]) 1980s   denies residual on 04/10/2016  . Type II diabetes mellitus (HCC)     Tobacco History: Social History   Tobacco Use  Smoking Status Former Smoker  . Packs/day: 1.50  . Years: 38.00  . Pack years: 57.00  . Types: Cigarettes  . Start date: 03/13/1977  . Last attempt to quit: 04/10/2016  . Years since quitting: 0.8  Smokeless Tobacco Never Used   Counseling given: Not Answered   Outpatient Encounter Medications as of  01/30/2017  Medication Sig  . albuterol (PROVENTIL HFA;VENTOLIN HFA) 108 (90 Base) MCG/ACT inhaler Inhale 2 puffs every 6 (six) hours as needed into the lungs for wheezing or shortness of breath.  [name] albuterol (PROVENTIL) (2.5 MG/3ML) 0.083% nebulizer solution Take 3 mLs (2.5 mg total) every 6 (six) hours as needed by nebulization for wheezing or shortness of breath.  [name] amLODipine (NORVASC) 5 MG tablet Take 1 tablet (5 mg total) by mouth daily.  . ARIPiprazole (ABILIFY) 5 MG tablet Take1 tab adily (Patient taking differently: Take 5 mg by mouth daily. Take1 tab adily)  . aspirin 81 MG EC tablet Take 1 tablet (81 mg total) by mouth daily. Swallow whole.  [name] atorvastatin (LIPITOR) 80 MG tablet Take 1 tablet (80 mg total) by mouth daily.  [name] BREO ELLIPTA 100-25 MCG/INH AEPB INHALE 1 PUFF EVERY DAY  . Diclofenac Sodium 3 % GEL Use on area of the pain  . escitalopram (LEXAPRO) 20 MG tablet Take 1 tablet (20 mg total) by mouth daily.  . flunisolide (NASALIDE) 25 MCG/ACT (0.025%) SOLN Place 2 sprays into the nose daily.  [name] gabapentin (NEURONTIN) 600 MG tablet Take 1 tablet (600 mg total) by mouth 3 (three) times daily.  [name] guaiFENesin (MUCINEX) 600 MG 12 hr tablet Take 1 tablet (600 mg total) by mouth 2 (two) times daily.  . metFORMIN (GLUCOPHAGE-XR) 500 MG 24 hr tablet Take 1 tablet (500 mg total) by mouth 2 (two) times daily.  . metolazone (ZAROXOLYN) 2.5 MG tablet Take 1 tablet (2.5 mg total) by mouth every Monday, Wednesday, and Friday. Take 30 mins prior to taking Torsemide  . mirtazapine (REMERON SOL-TAB) 45 MG disintegrating tablet Take 1 tablet (45 mg total) by mouth at bedtime.  . montelukast (SINGULAIR) 10 MG tablet Take 1 tablet (10 mg total) by mouth at bedtime.  . Multiple Vitamin (MULTIVITAMIN WITH MINERALS) TABS tablet Take 1 tablet by mouth daily.  [name] omeprazole (PRILOSEC) 40 MG capsule Take 1 capsule (40 mg total) by mouth 2 (two) times daily. in the morning and evebing  . OXYGEN Inhale 6 L  into the lungs daily.  . potassium chloride (K-DUR) 10 MEQ tablet Take 6 tablets (60 mEq total) by mouth every morning AND 6 tablets (60 mEq total) every evening. May also take 4 tablets (40 mEq total) at bedtime as needed (Wtih Metolazone on M/W/F).  [name] tiotropium (SPIRIVA HANDIHALER) 18 MCG inhalation capsule Place 1 capsule (18 mcg total) into inhaler and inhale daily.  [name] torsemide (DEMADEX) 20 MG tablet Take 4 tablets (80 mg total) by mouth 2 (two) times daily.  . [DISCONTINUED] benztropine (COGENTIN) 0.5 MG tablet  Take 1 tablet (0.5 mg total) by mouth 2 (two) times daily morning and evening (Patient not taking: Reported on 01/11/2017)  . [DISCONTINUED] traMADol (ULTRAM) 50 MG tablet Take 1 tablet (50 mg total) by mouth every 8 (eight) hours as needed. (Patient not taking: Reported on 01/19/2017)  . [DISCONTINUED] Trolamine Salicylate 10 % LOTN Apply 1 patch topically 3 (three) times daily as needed. (Patient not taking: Reported on 01/19/2017)   No facility-administered encounter medications on file as of 01/30/2017.      Review of Systems  Constitutional:   No  weight loss, night sweats,  Fevers, chills, +fatigue, or  lassitude.  HEENT:   No headaches,  Difficulty swallowing,  Tooth/dental problems, or  Sore throat,                No sneezing, itching, ear ache, nasal congestion, post nasal drip,   CV:  No chest pain,  Orthopnea, PND, swelling in lower extremities, anasarca, dizziness, palpitations, syncope.   GI  No heartburn, indigestion, abdominal pain, nausea, vomiting, diarrhea, change in bowel habits, loss of appetite, bloody stools.   Resp:    No chest wall deformity  Skin: no rash or lesions.  GU: no dysuria, change in color of urine, no urgency or frequency.  No flank pain, no hematuria   MS:  No joint pain or swelling.  No decreased range of motion.  No back pain.    Physical Exam  BP 128/84 (BP Location: Right Arm, Cuff Size: Large)   Pulse 90   Ht 5\' 4"  (1.626 m)    Wt 292 lb (132.5 kg)   SpO2 93%   BMI 50.12 kg/m   GEN: A/Ox3; pleasant , NAD, morbidly obese , in wc    HEENT:  /AT,  EACs-clear, TMs-wnl, NOSE-clear, THROAT-clear, no lesions, no postnasal drip or exudate noted.   NECK:  Supple w/ fair ROM; no JVD; normal carotid impulses w/o bruits; no thyromegaly or nodules palpated; no lymphadenopathy.    RESP  Clear  P & A; w/o, wheezes/ rales/ or rhonchi. no accessory muscle use, no dullness to percussion  CARD:  RRR, no m/r/g, tr peripheral edema, pulses intact, no cyanosis or clubbing.  GI:   Soft & nt; nml bowel sounds; no organomegaly or masses detected.   Musco: Warm bil, no deformities or joint swelling noted.   Neuro: alert, no focal deficits noted.    Skin: Warm, no lesions or rashes    Lab Results:  CBC  BMET   BNP   Imaging: Dg Chest 2 View  Result Date: 01/19/2017 CLINICAL DATA:  Shortness of breath with cough and nasal congestion for 1 week. EXAM: CHEST  2 VIEW COMPARISON:  01/06/2017 FINDINGS: Shallow inspiration. Cardiac enlargement. No vascular congestion, edema, or consolidation. No blunting of costophrenic angles. No pneumothorax. Mediastinal contours appear intact. IMPRESSION: Cardiac enlargement.  No evidence of active pulmonary disease. Electronically Signed   By: Lucienne Capers M.D.   On: 01/19/2017 03:25   Ct Head Wo Contrast  Result Date: 01/06/2017 CLINICAL DATA:  57 year old female with syncope. EXAM: CT HEAD WITHOUT CONTRAST TECHNIQUE: Contiguous axial images were obtained from the base of the skull through the vertex without intravenous contrast. COMPARISON:  Head CT dated 06/12/2016 FINDINGS: Brain: Mild bifrontal atrophy as well as chronic microvascular ischemic changes. There is no acute intracranial hemorrhage. No mass effect or midline shift. No extra-axial fluid collection. Probably partially empty sella. Vascular: No hyperdense vessel or unexpected calcification. Skull: Normal. Negative for  fracture or focal lesion. Sinuses/Orbits: The visualized paranasal sinuses are clear. There is mild bilateral mastoid effusion. Other: None IMPRESSION: 1. No acute intracranial pathology. 2. Mild age-related atrophy and chronic microvascular ischemic changes. Electronically Signed   By: Anner Crete M.D.   On: 01/06/2017 00:12   Dg Chest Port 1 View  Result Date: 01/06/2017 CLINICAL DATA:  Acute onset of generalized weakness and syncope. EXAM: PORTABLE CHEST 1 VIEW COMPARISON:  Chest radiograph and CTA of the chest performed 11/03/2016 FINDINGS: The lungs are hypoexpanded. Mild retrocardiac airspace opacity could reflect pneumonia. No pleural effusion or pneumothorax is seen. The cardiomediastinal silhouette is mildly enlarged. No acute osseous abnormalities are identified. IMPRESSION: 1. Lungs hypoexpanded. Mild retrocardiac airspace opacity could reflect pneumonia. 2. Mild cardiomegaly. Electronically Signed   By: Garald Balding M.D.   On: 01/06/2017 00:46   US Abdomen Limited Ruq  Result Date: 01/06/2017 CLINICAL DATA:  Acute onset of right upper quadrant abdominal pain that began yesterday. Patient had an indwelling cholecystostomy tube from April through July, 2018. Elevated serum alkaline phosphatase levels currently. EXAM: ULTRASOUND ABDOMEN LIMITED RIGHT UPPER QUADRANT COMPARISON:  Visualized upper abdomen on CTA chest 11/03/2016 and 10/07/2016. Urinary tract ultrasound 09/18/2016. No prior right upper quadrant abdominal ultrasound. FINDINGS: Gallbladder: Contracted containing multiple shadowing gallstones, the largest on the order of 6 mm in size. Mild wall thickening up to 4 mm. No pericholecystic fluid. Negative sonographic Murphy's sign according to the ultrasound technologist. Common bile duct: Diameter: Approximately 5 mm. The distal common bile duct is obscured by duodenal bowel gas. Liver: Diffusely increased and coarsened echotexture without focal hepatic parenchymal abnormality.  Portal vein is patent on color Doppler imaging with normal direction of blood flow towards the liver. IMPRESSION: 1. Cholelithiasis. Contracted gallbladder which accounts for mild wall thickening. No convincing sonographic evidence of acute cholecystitis. 2. No biliary ductal dilation. 3. Diffuse hepatic steatosis and/or hepatocellular disease without focal hepatic parenchymal abnormality. Electronically Signed   By: Evangeline Dakin M.D.   On: 01/06/2017 09:24     Assessment & Plan:   COPD (chronic obstructive pulmonary disease) ([place]) Compensated at present regimen  Plan  Patient Instructions  Continue on BREO 1 puff daily .  Continue on Spiriva daily .  Continue on Oxygen 6l/m .  Continue on Trilogy Vent At bedtime  .  Order for power mobility device .  Follow up with Dr. Lamonte Sakai  In 2 months  and As needed   Please contact office for sooner follow up if symptoms do not improve or worsen or seek emergency care       Chronic respiratory failure with hypoxia and hypercapnia (HCC) Hypoxic and hypercarbic respiratory failure with underlying OSA and OHS.  Patient will continue on trilogy.  She will continue on oxygen at 6 L.  Patient has increased mobility risk for falls due to weakness deconditioning morbid obesity and hypoxemia with mobility.  [name] for a power mobility device to improve functionality and safety.  Plan  Patient Instructions  Continue on BREO 1 puff daily .  Continue on Spiriva daily .  Continue on Oxygen 6l/m .  Continue on Trilogy Vent At bedtime  .  Order for power mobility device .  Follow up with Dr. Lamonte Sakai  In 2 months  and As needed   Please contact office for sooner follow up if symptoms do not improve or worsen or seek emergency care          Rexene Edison, NP 01/30/2017

## 2017-01-30 NOTE — Patient Instructions (Addendum)
Continue on BREO 1 puff daily .  Continue on Spiriva daily .  Continue on Oxygen 6l/m .  Continue on Trilogy Vent At bedtime  .  Order for power mobility device .  Follow up with Dr. Lamonte Sakai  In 2 months  and As needed   Please contact office for sooner follow up if symptoms do not improve or worsen or seek emergency care

## 2017-01-30 NOTE — Assessment & Plan Note (Signed)
Compensated at present regimen  Plan  Patient Instructions  Continue on BREO 1 puff daily .  Continue on Spiriva daily .  Continue on Oxygen 6l/m .  Continue on Trilogy Vent At bedtime  .  Order for power mobility device .  Follow up with Dr. Lamonte Sakai  In 2 months  and As needed   Please contact office for sooner follow up if symptoms do not improve or worsen or seek emergency care

## 2017-01-31 ENCOUNTER — Ambulatory Visit (INDEPENDENT_AMBULATORY_CARE_PROVIDER_SITE_OTHER): Payer: Medicare HMO | Admitting: Internal Medicine

## 2017-01-31 VITALS — BP 122/70 | HR 96 | Temp 98.7°F | Ht 64.0 in | Wt 294.6 lb

## 2017-01-31 DIAGNOSIS — M1990 Unspecified osteoarthritis, unspecified site: Secondary | ICD-10-CM | POA: Diagnosis not present

## 2017-01-31 DIAGNOSIS — D649 Anemia, unspecified: Secondary | ICD-10-CM | POA: Diagnosis not present

## 2017-01-31 DIAGNOSIS — M19049 Primary osteoarthritis, unspecified hand: Secondary | ICD-10-CM

## 2017-01-31 MED ORDER — MELOXICAM 7.5 MG PO TABS: 7.5000 mg | ORAL_TABLET | Freq: Every day | ORAL | 0 refills | Status: DC

## 2017-01-31 MED ORDER — ACETAMINOPHEN 500 MG PO TABS: 500.0000 mg | ORAL_TABLET | Freq: Four times a day (QID) | ORAL | 0 refills | Status: DC | PRN

## 2017-01-31 MED ORDER — FERROUS SULFATE 325 (65 FE) MG PO TABS: 325.0000 mg | ORAL_TABLET | Freq: Every day | ORAL | 2 refills | Status: DC

## 2017-01-31 NOTE — Patient Instructions (Addendum)
I have referred you rheumatology for work up. I will do blood work. Follow up two weeks.   We have given you Mobic to take daily to help with your pain.   [facility]  Address: [address] [place], [place] [address]  Phone: [phone]

## 2017-01-31 NOTE — Progress Notes (Signed)
   [name] [name], MD Phone: [phone]  Reason For Visit: Follow-up for joint pain  #Arthritis -Has had joint pains for several years and previously was taking naproxen twice daily to help with these.  States that her joint pain is terrible it is in her hands, her shoulders, her, her elbows.  She has been taking the naproxen AGAINST MEDICAL ADVICE.  She denies any swelling in any of these joints.  #Anemia -Patient is concerned about her hemoglobin levels.  She denies any dizziness, palpitation.  she does endorse shortness of breath but this is more concerning for her heart failure and previous history of fluid overload.   #Diastolic CHF -Patient has been following with cardiology.  They are managing her medications.  Recently have increased her metolazone to 3 times weekly.   past Medical History Reviewed problem list.  Medications- reviewed and updated No additions to family history Social history- patient is a non-smoker  Objective: BP 122/70   Pulse 96   Temp 98.7 F (37.1 C)   Ht 5\' 4"  (1.626 m)   Wt 294 lb 9.6 oz (133.6 kg)   SpO2 95%   BMI 50.57 kg/m  Gen: NAD, alert, cooperative with exam Cardio: regular rate and rhythm, S1S2 heard, no murmurs appreciated Pulm: clear to auscultation bilaterally, no wheezes, rhonchi or rales Extremities: No abnormalities noted on inspection, tenderness throughout all joints, no swelling in joints, no erythema in joints, Skin: dry, intact, no rashes or lesions   Assessment/Plan: See problem based a/p  No problem-specific Assessment & Plan notes found for this encounter.

## 2017-02-01 ENCOUNTER — Encounter: Payer: Self-pay | Admitting: Internal Medicine

## 2017-02-01 DIAGNOSIS — I5032 Chronic diastolic (congestive) heart failure: Secondary | ICD-10-CM | POA: Diagnosis not present

## 2017-02-01 DIAGNOSIS — R062 Wheezing: Secondary | ICD-10-CM | POA: Diagnosis not present

## 2017-02-01 DIAGNOSIS — D631 Anemia in chronic kidney disease: Secondary | ICD-10-CM | POA: Diagnosis not present

## 2017-02-01 DIAGNOSIS — I130 Hypertensive heart and chronic kidney disease with heart failure and stage 1 through stage 4 chronic kidney disease, or unspecified chronic kidney disease: Secondary | ICD-10-CM | POA: Diagnosis not present

## 2017-02-01 DIAGNOSIS — E1122 Type 2 diabetes mellitus with diabetic chronic kidney disease: Secondary | ICD-10-CM | POA: Diagnosis not present

## 2017-02-01 DIAGNOSIS — G4733 Obstructive sleep apnea (adult) (pediatric): Secondary | ICD-10-CM | POA: Diagnosis not present

## 2017-02-01 DIAGNOSIS — R0602 Shortness of breath: Secondary | ICD-10-CM | POA: Diagnosis not present

## 2017-02-01 DIAGNOSIS — N183 Chronic kidney disease, stage 3 (moderate): Secondary | ICD-10-CM | POA: Diagnosis not present

## 2017-02-01 DIAGNOSIS — J189 Pneumonia, unspecified organism: Secondary | ICD-10-CM | POA: Diagnosis not present

## 2017-02-01 DIAGNOSIS — J45909 Unspecified asthma, uncomplicated: Secondary | ICD-10-CM | POA: Diagnosis not present

## 2017-02-01 DIAGNOSIS — J449 Chronic obstructive pulmonary disease, unspecified: Secondary | ICD-10-CM | POA: Diagnosis not present

## 2017-02-01 DIAGNOSIS — F209 Schizophrenia, unspecified: Secondary | ICD-10-CM | POA: Diagnosis not present

## 2017-02-01 DIAGNOSIS — R55 Syncope and collapse: Secondary | ICD-10-CM | POA: Diagnosis not present

## 2017-02-01 DIAGNOSIS — F319 Bipolar disorder, unspecified: Secondary | ICD-10-CM | POA: Diagnosis not present

## 2017-02-01 DIAGNOSIS — I509 Heart failure, unspecified: Secondary | ICD-10-CM | POA: Diagnosis not present

## 2017-02-01 DIAGNOSIS — M6281 Muscle weakness (generalized): Secondary | ICD-10-CM | POA: Diagnosis not present

## 2017-02-01 DIAGNOSIS — J440 Chronic obstructive pulmonary disease with acute lower respiratory infection: Secondary | ICD-10-CM | POA: Diagnosis not present

## 2017-02-01 LAB — RHEUMATOID FACTOR: Rhuematoid fact SerPl-aCnc: 13.8 IU/mL (ref 0.0–13.9)

## 2017-02-01 LAB — ANA: Anti Nuclear Antibody(ANA): NEGATIVE

## 2017-02-01 LAB — IRON AND TIBC
Iron Saturation: 12 % — ABNORMAL LOW (ref 15–55)
Iron: 50 ug/dL (ref 27–159)
Total Iron Binding Capacity: 432 ug/dL (ref 250–450)
UIBC: 382 ug/dL (ref 131–425)

## 2017-02-01 LAB — SEDIMENTATION RATE: Sed Rate: 66 mm/hr — ABNORMAL HIGH (ref 0–40)

## 2017-02-01 LAB — C-REACTIVE PROTEIN: CRP: 16.3 mg/L — ABNORMAL HIGH (ref 0.0–4.9)

## 2017-02-01 NOTE — Assessment & Plan Note (Addendum)
History of anemia though not macrocytic or microcytic.  Patient has not ever had a colonoscopy.  No known source of bleeding.  Not interested in getting a colonoscopy.  Will check some lab work.  Given patient's chronic sickly nature on 6 L of oxygen worsening kidney function likely concerning for anemia of chronic disease - Iron and TIBC -Plans to follow-up with nephrology - Fecal occult blood, imunochemical([facility]) - ferrous sulfate 325 (65 FE) MG tablet; Take 1 tablet (325 mg total) by mouth daily.  Dispense: 30 tablet; Refill: 2 -Will likely need to obtain folate and B12 in the future if other lab work is negative

## 2017-02-01 NOTE — Addendum Note (Signed)
Addended by: Parke Poisson E on: 02/01/2017 01:01 PM   Modules accepted: Orders

## 2017-02-01 NOTE — Assessment & Plan Note (Signed)
Given this is a multi-joint arthropathy unlikely to have benefit from orthopedics.  Patient did well on naproxen however worsening kidney function makes this a significantly harder question to continue prescribing.  Patient has a history of seizures and therefore should not be placed on tramadol.  Given patient with poly-articular arthritis most concerning for osteoarthritis as this is worse with motion and weightbearing is relieved by rest.  However no workup has been done in the past to rule out rheumatoid arthritis, polymyalgia rheumatica, SLE.  It is also possible given the multi-joint tenderness that patient has some form of fibromyalgia -will obtain some initial lab work as below-would prefer to follow-up immediately with rheumatology rather than waiting on results of lab work.  Therefore will place a ambulatory referral to rheumatology. -Discussed the benefits and risks of NSAID use with patient specifically were possible worsening of kidney function.  However patient would like to try an NSAID anyway because she states the pain is too severe.  Will start her on Mobic and advised her not to take naproxen on this medication - acetaminophen (TYLENOL) 500 MG tablet; Take 1 tablet (500 mg total) by mouth every 6 (six) hours as needed.  Dispense: 60 tablet; Refill: 0 - ANA - Rheumatoid factor - Sedimentation Rate - C-reactive protein - Ambulatory referral to Rheumatology - meloxicam (MOBIC) 7.5 MG tablet; Take 1 tablet (7.5 mg total) by mouth daily.  Dispense: 30 tablet; Refill: 0

## 2017-02-05 ENCOUNTER — Ambulatory Visit: Payer: Self-pay | Admitting: Family Medicine

## 2017-02-06 NOTE — Progress Notes (Signed)
PT Evaluation Addendum Late entry for missed G-code on 01/21/2017 Based on review of documentation and goals    21-Jan-2017 1509  PT G-Codes **NOT FOR INPATIENT CLASS**  Functional Assessment Tool Used AM-PAC 6 Clicks Basic Mobility (chart review)  Functional Limitation Mobility: Walking and moving around  Mobility: Walking and Moving Around Current Status (E4235) CL  Mobility: Walking and Moving Around Goal Status [phone]) CI  02/06/2017 [place], PT [phone]

## 2017-02-07 DIAGNOSIS — D631 Anemia in chronic kidney disease: Secondary | ICD-10-CM | POA: Diagnosis not present

## 2017-02-07 DIAGNOSIS — F319 Bipolar disorder, unspecified: Secondary | ICD-10-CM | POA: Diagnosis not present

## 2017-02-07 DIAGNOSIS — N183 Chronic kidney disease, stage 3 (moderate): Secondary | ICD-10-CM | POA: Diagnosis not present

## 2017-02-07 DIAGNOSIS — J449 Chronic obstructive pulmonary disease, unspecified: Secondary | ICD-10-CM | POA: Diagnosis not present

## 2017-02-07 DIAGNOSIS — I5032 Chronic diastolic (congestive) heart failure: Secondary | ICD-10-CM | POA: Diagnosis not present

## 2017-02-07 DIAGNOSIS — E1122 Type 2 diabetes mellitus with diabetic chronic kidney disease: Secondary | ICD-10-CM | POA: Diagnosis not present

## 2017-02-07 DIAGNOSIS — R55 Syncope and collapse: Secondary | ICD-10-CM | POA: Diagnosis not present

## 2017-02-07 DIAGNOSIS — F209 Schizophrenia, unspecified: Secondary | ICD-10-CM | POA: Diagnosis not present

## 2017-02-07 DIAGNOSIS — I130 Hypertensive heart and chronic kidney disease with heart failure and stage 1 through stage 4 chronic kidney disease, or unspecified chronic kidney disease: Secondary | ICD-10-CM | POA: Diagnosis not present

## 2017-02-09 DIAGNOSIS — J449 Chronic obstructive pulmonary disease, unspecified: Secondary | ICD-10-CM | POA: Diagnosis not present

## 2017-02-09 DIAGNOSIS — R062 Wheezing: Secondary | ICD-10-CM | POA: Diagnosis not present

## 2017-02-09 DIAGNOSIS — G4733 Obstructive sleep apnea (adult) (pediatric): Secondary | ICD-10-CM | POA: Diagnosis not present

## 2017-02-09 DIAGNOSIS — J45909 Unspecified asthma, uncomplicated: Secondary | ICD-10-CM | POA: Diagnosis not present

## 2017-02-09 DIAGNOSIS — J9620 Acute and chronic respiratory failure, unspecified whether with hypoxia or hypercapnia: Secondary | ICD-10-CM | POA: Diagnosis not present

## 2017-02-09 DIAGNOSIS — M6281 Muscle weakness (generalized): Secondary | ICD-10-CM | POA: Diagnosis not present

## 2017-02-09 DIAGNOSIS — R0602 Shortness of breath: Secondary | ICD-10-CM | POA: Diagnosis not present

## 2017-02-09 DIAGNOSIS — J189 Pneumonia, unspecified organism: Secondary | ICD-10-CM | POA: Diagnosis not present

## 2017-02-09 DIAGNOSIS — I509 Heart failure, unspecified: Secondary | ICD-10-CM | POA: Diagnosis not present

## 2017-02-09 DIAGNOSIS — J440 Chronic obstructive pulmonary disease with acute lower respiratory infection: Secondary | ICD-10-CM | POA: Diagnosis not present

## 2017-02-12 ENCOUNTER — Other Ambulatory Visit: Payer: Self-pay | Admitting: *Deleted

## 2017-02-12 ENCOUNTER — Ambulatory Visit (INDEPENDENT_AMBULATORY_CARE_PROVIDER_SITE_OTHER): Payer: Medicare HMO | Admitting: Orthopaedic Surgery

## 2017-02-12 ENCOUNTER — Ambulatory Visit (INDEPENDENT_AMBULATORY_CARE_PROVIDER_SITE_OTHER): Payer: Medicare HMO

## 2017-02-12 ENCOUNTER — Encounter (INDEPENDENT_AMBULATORY_CARE_PROVIDER_SITE_OTHER): Payer: Self-pay | Admitting: Orthopaedic Surgery

## 2017-02-12 ENCOUNTER — Other Ambulatory Visit (HOSPITAL_COMMUNITY): Payer: Self-pay | Admitting: Psychiatry

## 2017-02-12 DIAGNOSIS — M79642 Pain in left hand: Secondary | ICD-10-CM

## 2017-02-12 DIAGNOSIS — M79641 Pain in right hand: Secondary | ICD-10-CM | POA: Diagnosis not present

## 2017-02-12 DIAGNOSIS — F200 Paranoid schizophrenia: Secondary | ICD-10-CM

## 2017-02-12 MED ORDER — ATORVASTATIN CALCIUM 80 MG PO TABS: 80.0000 mg | ORAL_TABLET | Freq: Every day | ORAL | 3 refills | Status: DC

## 2017-02-12 MED ORDER — AMLODIPINE BESYLATE 5 MG PO TABS: 5.0000 mg | ORAL_TABLET | Freq: Every day | ORAL | 0 refills | Status: DC

## 2017-02-12 NOTE — Progress Notes (Signed)
Office Visit Note   Patient: Tiffany [name]           Date of Birth: [birth year]           MRN: [id number] Visit Date: 02/12/2017              Requested by: [name], [address], [place] [id number] PCP: [name], MD   Assessment & Plan: Visit Diagnoses:  1. Bilateral hand pain     Plan: Due to the diffuse pain to all 10 PIP joints and her family history of rheumatoid arthritis, if it is appropriate to obtain blood work today which will include an arthritis panel.  If these show elevated markers, we will refer her accordingly.  Follow-Up Instructions: Return if symptoms worsen or fail to improve.   Orders:  Orders Placed This Encounter  Procedures  . XR Hand Complete Left  . XR Hand Complete Right   No orders of the defined types were placed in this encounter.     Procedures: No procedures performed   Clinical Data: No additional findings.   Subjective: Chief Complaint  Patient presents with  . Left Hand - Pain  . Right Hand - Pain    HPI Tiffany [name] is a pleasant 57 year old female who presents our clinic today with bilateral hand pain both equally as bad.  This began 1 year ago without any known injury or change in activity.  All of her pain is throughout all 10 PIP joints.  She describes this as a constant soreness/stiffness.  She has increased pain opening jars as well as doors.  [name] tried meloxicam without relief of symptoms.  No numbness tingling burning to either hand or fingers.  Of note she states that all of her joints hurt including her knees ankles and shoulders.  She does have a family history of rheumatoid arthritis in her mother and in her father.  She has not been worked up for this.  Review of Systems as detailed in HPI.  All others reviewed and are negative.   Objective: Vital Signs: There were no vitals taken for this visit.  Physical Exam well-developed well-nourished female no acute distress.  Alert and  oriented x3.  Ortho Exam examination of both hands reveal no swelling to the PIP joints.  No increased pain to palpation.  Full range of motion.  [name] neurovascular intact distally.  Specialty Comments:  No specialty comments available.  Imaging: Xr Hand Complete Left  Result Date: 02/12/2017 X-rays show no structural abnormalities.  Mild PIP arthritis  Xr Hand Complete Right  Result Date: 02/12/2017 X-rays show no structural abnormalities.  Mild PIP arthritis    PMFS History: Patient Active Problem List   Diagnosis Date Noted  . Syncope 01/06/2017  . Hypomagnesemia   . Prolonged Q-T interval on ECG   . CKD (chronic kidney disease) 12/15/2016  . Carotid artery stenosis   . Right foot pain 11/15/2016  . Right carotid bruit 11/09/2016  . Hypokalemia   . Osteoarthritis 10/26/2016  . Dyspnea 10/07/2016  . Cardiac arrest ([facility]) 09/08/2016  . History of open reduction and internal fixation (ORIF) procedure   . Closed displaced transverse fracture of shaft of right tibia   . Fall   . Overactive bladder 06/07/2016  . QT prolongation   . OSA and COPD overlap syndrome ([place])   . Generalized weakness   . Arthritis   . Bipolar I disorder ([facility])   . Essential hypertension  03/22/2016  . Bipolar affective disorder, mixed, severe, with psychotic behavior ([facility]) 11/28/2015  . Cocaine use disorder, severe, dependence ([place]) 11/28/2015  . Chronic diastolic congestive heart failure ([place])   . Chronic respiratory failure with hypoxia and hypercapnia ([facility]) 06/22/2015  . Onychomycosis 01/21/2015  . Nasal congestion 12/17/2014  . Tobacco use disorder 07/22/2014  . COPD (chronic obstructive pulmonary disease) ([facility]) 07/08/2014  . Knee pain, bilateral 01/22/2013  . Seizure ([place]) 01/04/2013  . Health care maintenance 11/25/2012  . History of kidney stones 06/18/2012  . Chronic pain syndrome 06/18/2012  . Allergic reaction 04/07/2012  . HSV infection 08/30/2011  . Dyslipidemia 04/24/2011  .  Anemia 04/24/2011  . Diabetic neuropathy ([place]) 04/24/2011  . Obstructive sleep apnea 10/18/2010  . Asthma 10/18/2010  . Morbid obesity ([place] [facility]) 10/18/2010  . Type 2 diabetes mellitus ([place]) 10/18/2010   Past Medical History:  Diagnosis Date  . Anxiety   . Arthritis    "all over" (04/10/2016)  . Asthma   . Cardiac arrest ([place]) 09/08/2016   PEA  . Carotid artery stenosis    1-39% bilateral by dopplers 11/2016  . Chronic bronchitis ([place])   . Chronic diastolic (congestive) heart failure ([place])   . Chronic kidney disease    "I see a kidney dr." (04/10/2016)  . Cocaine abuse ([place])   . Complication of anesthesia    decreased bp, decreased heart rate  . Depression   . Disorder of nervous system   . Emphysema   . GERD (gastroesophageal reflux disease)   . Heart attack ([place]) 1980s  . History of blood transfusion 1994   "couldn't stop bleeding from my period"  . Hyperlipidemia LDL goal <70   . Hypertension   . Incontinence   . Manic depression ([place])   . On home oxygen therapy    "6L; 24/7" (04/10/2016)  . OSA on CPAP    "wear mask sometimes" (04/10/2016)  . Paranoid ([place])    "sometimes; I'm on RX for it" (04/10/2016)  . Pneumonia    "I've had it several times; haven't had it since 06/2015" (04/10/2016)  . Schizophrenia ([place])   . Seasonal allergies   . Seizures ([place])    "don't know what kind; last one was ~ 1 yr ago" (04/10/2016)  . Sinus trouble   . Stroke [facility]) 1980s   denies residual on 04/10/2016  . Type II diabetes mellitus ([facility])     Family History  Problem Relation Age of Onset  . Cancer Father        prostate  . Cancer Mother        lung  . Depression Mother   . Depression Sister   . Anxiety disorder Sister   . Schizophrenia Sister   . Bipolar disorder Sister   . Depression Sister   . Depression Brother   . Heart failure Unknown        cousin    Past Surgical History:  Procedure Laterality Date  . CESAREAN SECTION  1997  . HERNIA REPAIR    . IR CHOLANGIOGRAM  EXISTING TUBE  07/20/2016  . IR PERC CHOLECYSTOSTOMY  05/10/2016  . IR RADIOLOGIST EVAL & MGMT  06/08/2016  . IR RADIOLOGIST EVAL & MGMT  06/29/2016  . IR SINUS/FIST TUBE CHK-NON GI  07/12/2016  . TIBIA IM NAIL INSERTION Right 07/12/2016   Procedure: INTRAMEDULLARY (IM) NAIL RIGHT TIBIA;  Surgeon: Leandrew Koyanagi, MD;  Location: [facility];  Service: Orthopedics;  Laterality: Right;  . UMBILICAL HERNIA REPAIR  ~  1963   "that's why I don't have a belly button"  . VAGINAL HYSTERECTOMY     Social History   Occupational History  . Occupation: disabled    Comment: factory production  Tobacco Use  . Smoking status: Former Smoker    Packs/day: 1.50    Years: 38.00    Pack years: 57.00    Types: Cigarettes    Start date: 03/13/1977    Last attempt to quit: 04/10/2016    Years since quitting: 0.8  . Smokeless tobacco: Never Used  Substance and Sexual Activity  . Alcohol use: No    Alcohol/week: 0.0 oz  . Drug use: No    Comment: 04/10/2016 "last used cocaine back in November 2017"  . Sexual activity: No    Birth control/protection: Surgical

## 2017-02-13 ENCOUNTER — Other Ambulatory Visit (HOSPITAL_COMMUNITY): Payer: Self-pay

## 2017-02-13 ENCOUNTER — Other Ambulatory Visit (INDEPENDENT_AMBULATORY_CARE_PROVIDER_SITE_OTHER): Payer: Self-pay | Admitting: Physician Assistant

## 2017-02-13 ENCOUNTER — Telehealth (INDEPENDENT_AMBULATORY_CARE_PROVIDER_SITE_OTHER): Payer: Self-pay | Admitting: Radiology

## 2017-02-13 DIAGNOSIS — F200 Paranoid schizophrenia: Secondary | ICD-10-CM

## 2017-02-13 LAB — URIC ACID: Uric Acid, Serum: 13.3 mg/dL — ABNORMAL HIGH (ref 2.5–7.0)

## 2017-02-13 LAB — SEDIMENTATION RATE: Sed Rate: 75 mm/h — ABNORMAL HIGH (ref 0–30)

## 2017-02-13 LAB — RHEUMATOID FACTOR: Rhuematoid fact SerPl-aCnc: <14 IU/mL (ref <14)

## 2017-02-13 LAB — ANA: Anti Nuclear Antibody(ANA): NEGATIVE

## 2017-02-13 MED ORDER — METHYLPREDNISOLONE 4 MG PO TBPK: ORAL_TABLET | ORAL | 0 refills | Status: DC

## 2017-02-13 MED ORDER — COLCHICINE 0.6 MG PO TABS: ORAL_TABLET | ORAL | 1 refills | Status: DC

## 2017-02-13 MED ORDER — ARIPIPRAZOLE 5 MG PO TABS: 5.0000 mg | ORAL_TABLET | Freq: Every day | ORAL | 0 refills | Status: DC

## 2017-02-13 NOTE — Progress Notes (Signed)
Gout attack

## 2017-02-13 NOTE — Telephone Encounter (Signed)
-----   Message from Aundra Dubin, PA-C sent at 02/13/2017  9:11 AM EST ----- Will you call patient and let her know her uric acid level is elevated which indicates a gouty attack.  I have called in medicine for her to start taking for this (two different rx)

## 2017-02-13 NOTE — Telephone Encounter (Signed)
I called patient and left voicemail to advise uric acid 13.3 advised needs to begin two separate gout medications. These have already sent in, advised she really wants this number at least in half, below a 7 or 6 would be more ideal.

## 2017-02-13 NOTE — Progress Notes (Signed)
Will you call patient and let her know her uric acid level is elevated which indicates a gouty attack.  I have called in medicine for her to start taking for this (two different rx)

## 2017-02-14 ENCOUNTER — Telehealth (HOSPITAL_COMMUNITY): Payer: Self-pay

## 2017-02-14 ENCOUNTER — Telehealth (HOSPITAL_COMMUNITY): Payer: Self-pay | Admitting: Surgery

## 2017-02-14 DIAGNOSIS — R55 Syncope and collapse: Secondary | ICD-10-CM | POA: Diagnosis not present

## 2017-02-14 DIAGNOSIS — D631 Anemia in chronic kidney disease: Secondary | ICD-10-CM | POA: Diagnosis not present

## 2017-02-14 DIAGNOSIS — I5032 Chronic diastolic (congestive) heart failure: Secondary | ICD-10-CM | POA: Diagnosis not present

## 2017-02-14 DIAGNOSIS — N183 Chronic kidney disease, stage 3 (moderate): Secondary | ICD-10-CM | POA: Diagnosis not present

## 2017-02-14 DIAGNOSIS — E1122 Type 2 diabetes mellitus with diabetic chronic kidney disease: Secondary | ICD-10-CM | POA: Diagnosis not present

## 2017-02-14 DIAGNOSIS — J449 Chronic obstructive pulmonary disease, unspecified: Secondary | ICD-10-CM | POA: Diagnosis not present

## 2017-02-14 DIAGNOSIS — I130 Hypertensive heart and chronic kidney disease with heart failure and stage 1 through stage 4 chronic kidney disease, or unspecified chronic kidney disease: Secondary | ICD-10-CM | POA: Diagnosis not present

## 2017-02-14 DIAGNOSIS — F319 Bipolar disorder, unspecified: Secondary | ICD-10-CM | POA: Diagnosis not present

## 2017-02-14 DIAGNOSIS — F209 Schizophrenia, unspecified: Secondary | ICD-10-CM | POA: Diagnosis not present

## 2017-02-14 NOTE — Telephone Encounter (Signed)
Patient will be discharged at this time from the [facility].  She refuses and is not willing to continue to participate in the program.

## 2017-02-14 NOTE — Telephone Encounter (Signed)
Pt not wanting paramedicine services at this time. Offered help and support but pt was not interested any further.   Marylouise Stacks, EMT-Paramedicine  02/14/17

## 2017-02-15 ENCOUNTER — Other Ambulatory Visit: Payer: Self-pay | Admitting: *Deleted

## 2017-02-15 DIAGNOSIS — R0602 Shortness of breath: Secondary | ICD-10-CM | POA: Diagnosis not present

## 2017-02-15 MED ORDER — ALBUTEROL SULFATE HFA 108 (90 BASE) MCG/ACT IN AERS: 2.0000 | INHALATION_SPRAY | Freq: Four times a day (QID) | RESPIRATORY_TRACT | 0 refills | Status: DC | PRN

## 2017-02-16 ENCOUNTER — Encounter (HOSPITAL_COMMUNITY): Payer: Self-pay | Admitting: Psychiatry

## 2017-02-16 ENCOUNTER — Ambulatory Visit (INDEPENDENT_AMBULATORY_CARE_PROVIDER_SITE_OTHER): Payer: Medicare HMO | Admitting: Psychiatry

## 2017-02-16 DIAGNOSIS — Z87891 Personal history of nicotine dependence: Secondary | ICD-10-CM | POA: Diagnosis not present

## 2017-02-16 DIAGNOSIS — F1421 Cocaine dependence, in remission: Secondary | ICD-10-CM | POA: Diagnosis not present

## 2017-02-16 DIAGNOSIS — F200 Paranoid schizophrenia: Secondary | ICD-10-CM | POA: Diagnosis not present

## 2017-02-16 DIAGNOSIS — Z818 Family history of other mental and behavioral disorders: Secondary | ICD-10-CM | POA: Diagnosis not present

## 2017-02-16 DIAGNOSIS — Z79899 Other long term (current) drug therapy: Secondary | ICD-10-CM | POA: Diagnosis not present

## 2017-02-16 MED ORDER — ESCITALOPRAM OXALATE 20 MG PO TABS: 20.0000 mg | ORAL_TABLET | Freq: Every day | ORAL | 0 refills | Status: DC

## 2017-02-16 MED ORDER — MIRTAZAPINE 45 MG PO TBDP: 45.0000 mg | ORAL_TABLET | Freq: Every day | ORAL | 0 refills | Status: DC

## 2017-02-16 MED ORDER — ZIPRASIDONE HCL 60 MG PO CAPS: 60.0000 mg | ORAL_CAPSULE | Freq: Two times a day (BID) | ORAL | 0 refills | Status: DC

## 2017-02-16 NOTE — Progress Notes (Addendum)
[place] MD/PA/NP OP Progress Note  02/16/2017 11:08 AM Tiffany [name]  MRN:  [id number]  Chief Complaint: I am not sleeping well.  I like to go back on Geodon.  HPI: Patient came for her follow-up appointment.  She was admitted twice on the medical floor because of change in mental status, feeling dizziness and tired.  She was seen by consultation liaison services.  She has a low potassium.  Patient has a lot of health issues.  She was seen by cardiology for CHF.  [name] using oxygen.  We started her on Abilify 2 months ago.  She was feeling better but now she is complaining that she cannot sleep very well.  She insists that she like to go back on Geodon.  She had a good response with the Geodon in the past but due to high QTC it was discontinued.  Patient took [place] and Abilify for a few weeks until she was told not to do that.  On her last visit we increased Remeron to help the insomnia but she continued to endorse racing thoughts and poor sleep.  Patient told that due to lack of sleep she is depressed, anxious and sometimes hopeless and helpless.  [name] still taking pain medication, gabapentin.  Patient denies any suicidal thoughts or homicidal thought.  However she endorses fatigue, lack of energy, lack of motivation.  She has no tremors or shakes.  She feels proud that she is no longer using cocaine or drugs.  She still have dizziness and very worried and concerned about her physical health.  She has upcoming appointment with a cardiologist.  She denies any paranoia, delusion, hallucination or any suicidal thoughts.  She lives with her son and some time that has been stressful.  However she denies any aggressive behavior.  Visit Diagnosis:    ICD-10-CM   1. Paranoid schizophrenia ([place]) F20.0 mirtazapine (REMERON SOL-TAB) 45 MG disintegrating tablet    escitalopram (LEXAPRO) 20 MG tablet    Past Psychiatric History: Reviewed. Patient has multiple psychiatric hospitalization due to drug use and mental  illness. Her last hospitalization was in November 2017 at [facility]. She had psychiatric illness started in 1984-03-08 when her daughter died due to cancer. In the past she had tried Abilify, Risperdal, Wellbutrin, Xanax, Klonopin, trazodone and BuSpar. She saw psychiatrist at [facility]. Patient denies any history of suicidal attempt but admitted history of suicidal thoughts. She has history of paranoia, delusion, anxiety, manic-like symptoms. She used to take Geodon 180 mg but does reduced due to high QTC interval.  Past Medical History:  Past Medical History:  Diagnosis Date  . Anxiety   . Arthritis    "all over" (04/10/2016)  . Asthma   . Cardiac arrest ([place]) 09/08/2016   PEA  . Carotid artery stenosis    1-39% bilateral by dopplers 11/2016  . Chronic bronchitis ([place])   . Chronic diastolic (congestive) heart failure ([place])   . Chronic kidney disease    "I see a kidney dr." (04/10/2016)  . Cocaine abuse ([place])   . Complication of anesthesia    decreased bp, decreased heart rate  . Depression   . Disorder of nervous system   . Emphysema   . GERD (gastroesophageal reflux disease)   . Heart attack ([place]) 1980s  . History of blood transfusion 1994   "couldn't stop bleeding from my period"  . Hyperlipidemia LDL goal <70   . Hypertension   . Incontinence   . Manic depression ([place])   .  On home oxygen therapy    "6L; 24/7" (04/10/2016)  . OSA on CPAP    "wear mask sometimes" (04/10/2016)  . Paranoid ([place])    "sometimes; I'm on RX for it" (04/10/2016)  . Pneumonia    "I've had it several times; haven't had it since 06/2015" (04/10/2016)  . Schizophrenia ([place])   . Seasonal allergies   . Seizures ([place])    "don't know what kind; last one was ~ 1 yr ago" (04/10/2016)  . Sinus trouble   . Stroke [facility]) 1980s   denies residual on 04/10/2016  . Type II diabetes mellitus ([place])     Past Surgical History:  Procedure Laterality Date  . CESAREAN SECTION  1997  . HERNIA REPAIR    .  IR CHOLANGIOGRAM EXISTING TUBE  07/20/2016  . IR PERC CHOLECYSTOSTOMY  05/10/2016  . IR RADIOLOGIST EVAL & MGMT  06/08/2016  . IR RADIOLOGIST EVAL & MGMT  06/29/2016  . IR SINUS/FIST TUBE CHK-NON GI  07/12/2016  . TIBIA IM NAIL INSERTION Right 07/12/2016   Procedure: INTRAMEDULLARY (IM) NAIL RIGHT TIBIA;  Surgeon: Leandrew Koyanagi, MD;  Location: [facility];  Service: Orthopedics;  Laterality: Right;  . UMBILICAL HERNIA REPAIR  ~ 1963   "that's why I don't have a belly button"  . VAGINAL HYSTERECTOMY      Family Psychiatric History: Viewed with  Family History:  Family History  Problem Relation Age of Onset  . Cancer Father        prostate  . Cancer Mother        lung  . Depression Mother   . Depression Sister   . Anxiety disorder Sister   . Schizophrenia Sister   . Bipolar disorder Sister   . Depression Sister   . Depression Brother   . Heart failure Unknown        cousin    Social History:  Social History   Socioeconomic History  . Marital status: Widowed    Spouse name: Not on file  . Number of children: 3  . Years of education: Not on file  . Highest education level: Not on file  Social Needs  . Financial resource strain: Not on file  . Food insecurity - worry: Not on file  . Food insecurity - inability: Not on file  . Transportation needs - medical: Not on file  . Transportation needs - non-medical: Not on file  Occupational History  . Occupation: disabled    Comment: factory production  Tobacco Use  . Smoking status: Former Smoker    Packs/day: 1.50    Years: 38.00    Pack years: 57.00    Types: Cigarettes    Start date: 03/13/1977    Last attempt to quit: 04/10/2016    Years since quitting: 0.8  . Smokeless tobacco: Never Used  Substance and Sexual Activity  . Alcohol use: No    Alcohol/week: 0.0 oz  . Drug use: No    Comment: 04/10/2016 "last used cocaine back in November 2017"  . Sexual activity: No    Birth control/protection: Surgical  Other Topics Concern   . Not on file  Social History Narrative   Has 1 son, [name]   Lives with son and his boyfriend   Her house has ramps and handrails should she ever needs them.    Her mother lives down the street from her and is a good support person in addition to her son.   She drives herself, has private transportation.  Cocaine free since 02/24/16, smoke free since 04/10/16    Allergies:  Allergies  Allergen Reactions  . Hydrocodone-Acetaminophen Shortness Of Breath  . Hydroxyzine Anaphylaxis and Shortness Of Breath  . Latuda [Lurasidone Hcl] Anaphylaxis  . Codeine Nausea And Vomiting  . Sulfa Antibiotics Itching    Metabolic Disorder Labs: Recent Results (from the past 2160 hour(s))  Basic Metabolic Panel     Status: Abnormal   Collection Time: 12/01/16  3:17 PM  Result Value Ref Range   Glucose 83 65 - 99 mg/dL   BUN 35 (H) 6 - 24 mg/dL   Creatinine, Ser 2.05 (H) 0.57 - 1.00 mg/dL   GFR calc non Af Amer 26 (L) >59 mL/min/1.73   GFR calc Af Amer 30 (L) >59 mL/min/1.73   BUN/Creatinine Ratio 17 9 - 23   Sodium 146 (H) 134 - 144 mmol/L   Potassium 3.1 (L) 3.5 - 5.2 mmol/L   Chloride 96 96 - 106 mmol/L   CO2 34 (H) 20 - 29 mmol/L   Calcium 10.0 8.7 - 10.2 mg/dL  Basic Metabolic Panel     Status: Abnormal   Collection Time: 12/05/16  5:16 PM  Result Value Ref Range   Glucose 91 65 - 99 mg/dL   BUN 22 6 - 24 mg/dL   Creatinine, Ser 1.69 (H) 0.57 - 1.00 mg/dL   GFR calc non Af Amer 33 (L) >59 mL/min/1.73   GFR calc Af Amer 38 (L) >59 mL/min/1.73   BUN/Creatinine Ratio 13 9 - 23   Sodium 144 134 - 144 mmol/L   Potassium 3.1 (L) 3.5 - 5.2 mmol/L   Chloride 94 (L) 96 - 106 mmol/L   CO2 33 (H) 20 - 29 mmol/L   Calcium 9.9 8.7 - 10.2 mg/dL  Basic metabolic panel     Status: Abnormal   Collection Time: 12/14/16  3:11 PM  Result Value Ref Range   Sodium 140 135 - 145 mmol/L   Potassium 2.8 (L) 3.5 - 5.1 mmol/L   Chloride 95 (L) 101 - 111 mmol/L   CO2 32 22 - 32 mmol/L   Glucose, Bld 85  65 - 99 mg/dL   BUN 20 6 - 20 mg/dL   Creatinine, Ser 1.81 (H) 0.44 - 1.00 mg/dL   Calcium 9.2 8.9 - 10.3 mg/dL   GFR calc non Af Amer 30 (L) >60 mL/min   GFR calc Af Amer 35 (L) >60 mL/min    Comment: (NOTE) The eGFR has been calculated using the CKD EPI equation. This calculation has not been validated in all clinical situations. eGFR's persistently <60 mL/min signify possible Chronic Kidney Disease.    Anion gap 13 5 - 15  Basic metabolic panel     Status: Abnormal   Collection Time: 12/20/16  1:55 PM  Result Value Ref Range   Sodium 143 135 - 145 mmol/L   Potassium 3.5 3.5 - 5.1 mmol/L   Chloride 102 101 - 111 mmol/L   CO2 30 22 - 32 mmol/L   Glucose, Bld 126 (H) 65 - 99 mg/dL   BUN 14 6 - 20 mg/dL   Creatinine, Ser 1.52 (H) 0.44 - 1.00 mg/dL   Calcium 9.3 8.9 - 10.3 mg/dL   GFR calc non Af Amer 37 (L) >60 mL/min   GFR calc Af Amer 43 (L) >60 mL/min    Comment: (NOTE) The eGFR has been calculated using the CKD EPI equation. This calculation has not been validated in all clinical situations. eGFR's persistently <  60 mL/min signify possible Chronic Kidney Disease.    Anion gap 11 5 - 15  I-stat troponin, ED     Status: None   Collection Time: 01/06/17 12:37 AM  Result Value Ref Range   Troponin i, poc 0.00 0.00 - 0.08 ng/mL   Comment 3            Comment: Due to the release kinetics of cTnI, a negative result within the first hours of the onset of symptoms does not rule out myocardial infarction with certainty. If myocardial infarction is still suspected, repeat the test at appropriate intervals.   I-Stat CG4 Lactic Acid, ED     Status: Abnormal   Collection Time: 01/06/17 12:39 AM  Result Value Ref Range   Lactic Acid, Venous 2.17 (HH) 0.5 - 1.9 mmol/L   Comment NOTIFIED PHYSICIAN   Comprehensive metabolic panel     Status: Abnormal   Collection Time: 01/06/17 12:46 AM  Result Value Ref Range   Sodium 140 135 - 145 mmol/L   Potassium 2.6 (LL) 3.5 - 5.1 mmol/L     Comment: CRITICAL RESULT CALLED TO, READ BACK BY AND VERIFIED WITH: NUCKLES C,RN 01/06/17 0139 WAYK    Chloride 91 (L) 101 - 111 mmol/L   CO2 35 (H) 22 - 32 mmol/L   Glucose, Bld 96 65 - 99 mg/dL   BUN 25 (H) 6 - 20 mg/dL   Creatinine, Ser 1.65 (H) 0.44 - 1.00 mg/dL   Calcium 9.1 8.9 - 10.3 mg/dL   Total Protein 7.0 6.5 - 8.1 g/dL   Albumin 3.5 3.5 - 5.0 g/dL   AST 21 15 - 41 U/L   ALT 16 14 - 54 U/L   Alkaline Phosphatase 168 (H) 38 - 126 U/L   Total Bilirubin 0.4 0.3 - 1.2 mg/dL   GFR calc non Af Amer 33 (L) >60 mL/min   GFR calc Af Amer 39 (L) >60 mL/min    Comment: (NOTE) The eGFR has been calculated using the CKD EPI equation. This calculation has not been validated in all clinical situations. eGFR's persistently <60 mL/min signify possible Chronic Kidney Disease.    Anion gap 14 5 - 15  CBC with Differential     Status: Abnormal   Collection Time: 01/06/17 12:46 AM  Result Value Ref Range   WBC 12.1 (H) 4.0 - 10.5 K/uL   RBC 3.50 (L) 3.87 - 5.11 MIL/uL   Hemoglobin 9.9 (L) 12.0 - 15.0 g/dL   HCT 32.9 (L) 36.0 - 46.0 %   MCV 94.0 78.0 - 100.0 fL   MCH 28.3 26.0 - 34.0 pg   MCHC 30.1 30.0 - 36.0 g/dL   RDW 16.1 (H) 11.5 - 15.5 %   Platelets 439 (H) 150 - 400 K/uL   Neutrophils Relative % 72 %   Neutro Abs 8.7 (H) 1.7 - 7.7 K/uL   Lymphocytes Relative 22 %   Lymphs Abs 2.7 0.7 - 4.0 K/uL   Monocytes Relative 4 %   Monocytes Absolute 0.4 0.1 - 1.0 K/uL   Eosinophils Relative 2 %   Eosinophils Absolute 0.3 0.0 - 0.7 K/uL   Basophils Relative 0 %   Basophils Absolute 0.0 0.0 - 0.1 K/uL  Magnesium     Status: Abnormal   Collection Time: 01/06/17 12:46 AM  Result Value Ref Range   Magnesium 1.5 (L) 1.7 - 2.4 mg/dL  I-Stat CG4 Lactic Acid, ED     Status: None   Collection Time: 01/06/17  3:22  AM  Result Value Ref Range   Lactic Acid, Venous 1.35 0.5 - 1.9 mmol/L  CK     Status: None   Collection Time: 01/06/17  3:48 AM  Result Value Ref Range   Total CK 93 38  - 234 U/L  Magnesium     Status: None   Collection Time: 01/06/17  3:56 AM  Result Value Ref Range   Magnesium 2.2 1.7 - 2.4 mg/dL  Blood gas, arterial     Status: Abnormal   Collection Time: 01/06/17  5:00 AM  Result Value Ref Range   O2 Content 6.0 L/min   Delivery systems NASAL CANNULA    pH, Arterial 7.466 (H) 7.350 - 7.450   pCO2 arterial 54.5 (H) 32.0 - 48.0 mmHg   pO2, Arterial 141 (H) 83.0 - 108.0 mmHg   Bicarbonate 38.8 (H) 20.0 - 28.0 mmol/L   Acid-Base Excess 14.1 (H) 0.0 - 2.0 mmol/L   O2 Saturation 98.9 %   Patient temperature 98.6    Collection site RIGHT RADIAL    Drawn by [id number]    Sample type ARTERIAL DRAW    Allens test (pass/fail) PASS PASS  HIV antibody (Routine Testing)     Status: None   Collection Time: 01/06/17  5:08 AM  Result Value Ref Range   HIV Screen 4th Generation wRfx Non Reactive Non Reactive    Comment: (NOTE) Performed At: [facility] [place], [place] [id number] Rush Farmer MD [id number]   CBC     Status: Abnormal   Collection Time: 01/06/17  5:08 AM  Result Value Ref Range   WBC 12.2 (H) 4.0 - 10.5 K/uL   RBC 3.26 (L) 3.87 - 5.11 MIL/uL   Hemoglobin 9.1 (L) 12.0 - 15.0 g/dL   HCT 30.5 (L) 36.0 - 46.0 %   MCV 93.6 78.0 - 100.0 fL   MCH 27.9 26.0 - 34.0 pg   MCHC 29.8 (L) 30.0 - 36.0 g/dL   RDW 15.9 (H) 11.5 - 15.5 %   Platelets 436 (H) 150 - 400 K/uL  Comprehensive metabolic panel     Status: Abnormal   Collection Time: 01/06/17  5:08 AM  Result Value Ref Range   Sodium 141 135 - 145 mmol/L   Potassium 2.6 (LL) 3.5 - 5.1 mmol/L    Comment: CRITICAL RESULT CALLED TO, READ BACK BY AND VERIFIED WITH: [facility] M,RN 01/06/17 0616 WAYK    Chloride 93 (L) 101 - 111 mmol/L   CO2 38 (H) 22 - 32 mmol/L   Glucose, Bld 100 (H) 65 - 99 mg/dL   BUN 24 (H) 6 - 20 mg/dL   Creatinine, Ser 1.61 (H) 0.44 - 1.00 mg/dL   Calcium 8.7 (L) 8.9 - 10.3 mg/dL   Total Protein 7.0 6.5 - 8.1 g/dL   Albumin 3.3 (L) 3.5 - 5.0  g/dL   AST 18 15 - 41 U/L   ALT 16 14 - 54 U/L   Alkaline Phosphatase 158 (H) 38 - 126 U/L   Total Bilirubin 0.8 0.3 - 1.2 mg/dL   GFR calc non Af Amer 34 (L) >60 mL/min   GFR calc Af Amer 40 (L) >60 mL/min    Comment: (NOTE) The eGFR has been calculated using the CKD EPI equation. This calculation has not been validated in all clinical situations. eGFR's persistently <60 mL/min signify possible Chronic Kidney Disease.    Anion gap 10 5 - 15  Ethanol     Status: None   Collection  Time: 01/06/17  5:08 AM  Result Value Ref Range   Alcohol, Ethyl (B) <10 <10 mg/dL    Comment:        LOWEST DETECTABLE LIMIT FOR SERUM ALCOHOL IS 10 mg/dL FOR MEDICAL PURPOSES ONLY   Glucose, capillary     Status: None   Collection Time: 01/06/17  5:17 AM  Result Value Ref Range   Glucose-Capillary 86 65 - 99 mg/dL  Glucose, capillary     Status: Abnormal   Collection Time: 01/06/17 12:03 PM  Result Value Ref Range   Glucose-Capillary 114 (H) 65 - 99 mg/dL  Glucose, capillary     Status: None   Collection Time: 01/06/17  4:07 PM  Result Value Ref Range   Glucose-Capillary 98 65 - 99 mg/dL  Basic metabolic panel     Status: Abnormal   Collection Time: 01/06/17  9:39 PM  Result Value Ref Range   Sodium 139 135 - 145 mmol/L   Potassium 2.7 (LL) 3.5 - 5.1 mmol/L    Comment: CRITICAL RESULT CALLED TO, READ BACK BY AND VERIFIED WITH: HINCKLEY C,RN 01/06/17 2251 WAYK    Chloride 91 (L) 101 - 111 mmol/L   CO2 37 (H) 22 - 32 mmol/L   Glucose, Bld 134 (H) 65 - 99 mg/dL   BUN 21 (H) 6 - 20 mg/dL   Creatinine, Ser 1.54 (H) 0.44 - 1.00 mg/dL   Calcium 9.2 8.9 - 10.3 mg/dL   GFR calc non Af Amer 36 (L) >60 mL/min   GFR calc Af Amer 42 (L) >60 mL/min    Comment: (NOTE) The eGFR has been calculated using the CKD EPI equation. This calculation has not been validated in all clinical situations. eGFR's persistently <60 mL/min signify possible Chronic Kidney Disease.    Anion gap 11 5 - 15  Troponin I  (q 6hr x 3)     Status: None   Collection Time: 01/07/17 12:05 AM  Result Value Ref Range   Troponin I <0.03 <0.03 ng/mL  Hemoglobin A1c     Status: Abnormal   Collection Time: 01/07/17 12:05 AM  Result Value Ref Range   Hgb A1c MFr Bld 6.0 (H) 4.8 - 5.6 %    Comment: (NOTE) Pre diabetes:          5.7%-6.4% Diabetes:              >6.4% Glycemic control for   <7.0% adults with diabetes    Mean Plasma Glucose 125.5 mg/dL  Lipid panel     Status: Abnormal   Collection Time: 01/07/17 12:05 AM  Result Value Ref Range   Cholesterol 129 0 - 200 mg/dL   Triglycerides 188 (H) <150 mg/dL   HDL 35 (L) >40 mg/dL   Total CHOL/HDL Ratio 3.7 RATIO   VLDL 38 0 - 40 mg/dL   LDL Cholesterol 56 0 - 99 mg/dL    Comment:        Total Cholesterol/HDL:CHD Risk Coronary Heart Disease Risk Table                     Men   Women  1/2 Average Risk   3.4   3.3  Average Risk       5.0   4.4  2 X Average Risk   9.6   7.1  3 X Average Risk  23.4   11.0        Use the calculated Patient Ratio above and the CHD Risk Table to  determine the patient's CHD Risk.        ATP III CLASSIFICATION (LDL):  <100     mg/dL   Optimal  100-129  mg/dL   Near or Above                    Optimal  130-159  mg/dL   Borderline  160-189  mg/dL   High  >190     mg/dL   Very High   Glucose, capillary     Status: Abnormal   Collection Time: 01/07/17  8:09 AM  Result Value Ref Range   Glucose-Capillary 148 (H) 65 - 99 mg/dL  CBC     Status: Abnormal   Collection Time: 01/07/17  8:42 AM  Result Value Ref Range   WBC 10.1 4.0 - 10.5 K/uL   RBC 3.15 (L) 3.87 - 5.11 MIL/uL   Hemoglobin 8.9 (L) 12.0 - 15.0 g/dL   HCT 29.8 (L) 36.0 - 46.0 %   MCV 94.6 78.0 - 100.0 fL   MCH 28.3 26.0 - 34.0 pg   MCHC 29.9 (L) 30.0 - 36.0 g/dL   RDW 16.0 (H) 11.5 - 15.5 %   Platelets 396 150 - 400 K/uL  Basic metabolic panel     Status: Abnormal   Collection Time: 01/07/17  8:42 AM  Result Value Ref Range   Sodium 140 135 - 145 mmol/L    Potassium 3.0 (L) 3.5 - 5.1 mmol/L   Chloride 94 (L) 101 - 111 mmol/L   CO2 35 (H) 22 - 32 mmol/L   Glucose, Bld 146 (H) 65 - 99 mg/dL   BUN 24 (H) 6 - 20 mg/dL   Creatinine, Ser 1.46 (H) 0.44 - 1.00 mg/dL   Calcium 9.1 8.9 - 10.3 mg/dL   GFR calc non Af Amer 39 (L) >60 mL/min   GFR calc Af Amer 45 (L) >60 mL/min    Comment: (NOTE) The eGFR has been calculated using the CKD EPI equation. This calculation has not been validated in all clinical situations. eGFR's persistently <60 mL/min signify possible Chronic Kidney Disease.    Anion gap 11 5 - 15  Magnesium     Status: None   Collection Time: 01/07/17  8:42 AM  Result Value Ref Range   Magnesium 1.8 1.7 - 2.4 mg/dL  Troponin I (q 6hr x 3)     Status: None   Collection Time: 01/07/17  8:42 AM  Result Value Ref Range   Troponin I <0.03 <0.03 ng/mL  Troponin I (q 6hr x 3)     Status: None   Collection Time: 01/07/17 11:41 AM  Result Value Ref Range   Troponin I <0.03 <0.03 ng/mL  Glucose, capillary     Status: None   Collection Time: 01/07/17 11:42 AM  Result Value Ref Range   Glucose-Capillary 87 65 - 99 mg/dL  Potassium     Status: Abnormal   Collection Time: 01/07/17  3:00 PM  Result Value Ref Range   Potassium 2.9 (L) 3.5 - 5.1 mmol/L  Glucose, capillary     Status: None   Collection Time: 01/07/17  4:33 PM  Result Value Ref Range   Glucose-Capillary 97 65 - 99 mg/dL  Basic Metabolic Panel     Status: Abnormal   Collection Time: 01/10/17  4:18 PM  Result Value Ref Range   Glucose 101 (H) 65 - 99 mg/dL   BUN 18 6 - 24 mg/dL   Creatinine, Ser 1.33 (H) 0.57 -  1.00 mg/dL   GFR calc non Af Amer 44 (L) >59 mL/min/1.73   GFR calc Af Amer 51 (L) >59 mL/min/1.73   BUN/Creatinine Ratio 14 9 - 23   Sodium 149 (H) 134 - 144 mmol/L   Potassium 3.9 3.5 - 5.2 mmol/L   Chloride 102 96 - 106 mmol/L   CO2 29 20 - 29 mmol/L   Calcium 9.9 8.7 - 10.2 mg/dL  Urinalysis, Routine w reflex microscopic     Status: Abnormal    Collection Time: 01/19/17  2:58 AM  Result Value Ref Range   Color, Urine YELLOW YELLOW   APPearance CLEAR CLEAR   Specific Gravity, Urine 1.015 1.005 - 1.030   pH 5.0 5.0 - 8.0   Glucose, UA NEGATIVE NEGATIVE mg/dL   Hgb urine dipstick SMALL (A) NEGATIVE   Bilirubin Urine NEGATIVE NEGATIVE   Ketones, ur NEGATIVE NEGATIVE mg/dL   Protein, ur 30 (A) NEGATIVE mg/dL   Nitrite NEGATIVE NEGATIVE   Leukocytes, UA TRACE (A) NEGATIVE   RBC / HPF 0-5 0 - 5 RBC/hpf   WBC, UA 0-5 0 - 5 WBC/hpf   Bacteria, UA NONE SEEN NONE SEEN   Squamous Epithelial / LPF 0-5 (A) NONE SEEN  Basic metabolic panel     Status: Abnormal   Collection Time: 01/25/17  3:37 PM  Result Value Ref Range   Sodium 141 135 - 145 mmol/L   Potassium 3.0 (L) 3.5 - 5.1 mmol/L   Chloride 92 (L) 101 - 111 mmol/L   CO2 35 (H) 22 - 32 mmol/L   Glucose, Bld 90 65 - 99 mg/dL   BUN 21 (H) 6 - 20 mg/dL   Creatinine, Ser 1.55 (H) 0.44 - 1.00 mg/dL   Calcium 9.5 8.9 - 10.3 mg/dL   GFR calc non Af Amer 36 (L) >60 mL/min   GFR calc Af Amer 42 (L) >60 mL/min    Comment: (NOTE) The eGFR has been calculated using the CKD EPI equation. This calculation has not been validated in all clinical situations. eGFR's persistently <60 mL/min signify possible Chronic Kidney Disease.    Anion gap 14 5 - 15  Iron and TIBC     Status: Abnormal   Collection Time: 01/31/17  2:48 PM  Result Value Ref Range   Total Iron Binding Capacity 432 250 - 450 ug/dL   UIBC 382 131 - 425 ug/dL   Iron 50 27 - 159 ug/dL   Iron Saturation 12 (L) 15 - 55 %  ANA     Status: None   Collection Time: 01/31/17  2:48 PM  Result Value Ref Range   Anit Nuclear Antibody(ANA) Negative Negative  Rheumatoid factor     Status: None   Collection Time: 01/31/17  2:48 PM  Result Value Ref Range   Rhuematoid fact SerPl-aCnc 13.8 0.0 - 13.9 IU/mL  Sedimentation Rate     Status: Abnormal   Collection Time: 01/31/17  2:48 PM  Result Value Ref Range   Sed Rate 66 (H) 0 - 40  mm/hr  C-reactive protein     Status: Abnormal   Collection Time: 01/31/17  2:48 PM  Result Value Ref Range   CRP 16.3 (H) 0.0 - 4.9 mg/L  Uric acid     Status: Abnormal   Collection Time: 02/12/17  2:45 PM  Result Value Ref Range   Uric Acid, Serum 13.3 (H) 2.5 - 7.0 mg/dL    Comment: Therapeutic target for gout patients: <6.0 mg/dL .   Rheumatoid  Factor     Status: None   Collection Time: 02/12/17  2:45 PM  Result Value Ref Range   Rhuematoid fact SerPl-aCnc <14 <14 IU/mL  Antinuclear Antib (ANA)     Status: None   Collection Time: 02/12/17  2:45 PM  Result Value Ref Range   Anit Nuclear Antibody(ANA) NEGATIVE NEGATIVE    Comment: ANA IFA is a first line screen for detecting the presence of up to approximately 150 autoantibodies in various autoimmune diseases. A negative ANA IFA result suggests ANA-associated autoimmune diseases are not present at this time. . Visit Physician FAQs for interpretation of all antibodies in the [place], prevalence, and association with diseases at http://education.[facility].com/ [email or web address] .   Sed Rate (ESR)     Status: Abnormal   Collection Time: 02/12/17  2:45 PM  Result Value Ref Range   Sed Rate 75 (H) 0 - 30 mm/h   Lab Results  Component Value Date   HGBA1C 6.0 (H) 01/07/2017   MPG 125.5 01/07/2017   MPG 111.15 11/03/2016   No results found for: PROLACTIN Lab Results  Component Value Date   CHOL 129 01/07/2017   TRIG 188 (H) 01/07/2017   HDL 35 (L) 01/07/2017   CHOLHDL 3.7 01/07/2017   VLDL 38 01/07/2017   LDLCALC 56 01/07/2017   LDLCALC 44 07/10/2016   Lab Results  Component Value Date   TSH 0.563 03/15/2016   TSH 1.060 12/04/2015    Therapeutic Level Labs: No results found for: LITHIUM Lab Results  Component Value Date   VALPROATE 20 (L) 09/09/2016   VALPROATE 46 (L) 07/10/2016   No components found for:  CBMZ  Current Medications: Current Outpatient Medications  Medication Sig Dispense Refill  .  acetaminophen (TYLENOL) 500 MG tablet Take 1 tablet (500 mg total) by mouth every 6 (six) hours as needed. 60 tablet 0  . albuterol (PROVENTIL HFA;VENTOLIN HFA) 108 (90 Base) MCG/ACT inhaler Inhale 2 puffs into the lungs every 6 (six) hours as needed for wheezing or shortness of breath. 18 Inhaler 0  . albuterol (PROVENTIL) (2.5 MG/3ML) 0.083% nebulizer solution Take 3 mLs (2.5 mg total) every 6 (six) hours as needed by nebulization for wheezing or shortness of breath. 150 mL 1  . amLODipine (NORVASC) 5 MG tablet Take 1 tablet (5 mg total) by mouth daily. 90 tablet 0  . ARIPiprazole (ABILIFY) 5 MG tablet Take 1 tablet (5 mg total) by mouth daily. 30 tablet 0  . aspirin 81 MG EC tablet Take 1 tablet (81 mg total) by mouth daily. Swallow whole. 90 tablet 0  . atorvastatin (LIPITOR) 80 MG tablet Take 1 tablet (80 mg total) by mouth daily. 60 tablet 3  . BREO ELLIPTA 100-25 MCG/INH AEPB INHALE 1 PUFF EVERY DAY 60 each 2  . colchicine 0.6 MG tablet Take two pills po day 1 and then one pill po bid until symptoms resolve (do not take longer than 3 days) 8 tablet 1  . Diclofenac Sodium 3 % GEL Use on area of the pain 100 g 0  . escitalopram (LEXAPRO) 20 MG tablet Take 1 tablet (20 mg total) by mouth daily. 90 tablet 0  . ferrous sulfate 325 (65 FE) MG tablet Take 1 tablet (325 mg total) by mouth daily. 30 tablet 2  . flunisolide (NASALIDE) 25 MCG/ACT (0.025%) SOLN Place 2 sprays into the nose daily. 1 Bottle 2  . gabapentin (NEURONTIN) 600 MG tablet Take 1 tablet (600 mg total) by mouth 3 (three) times daily. [place]  tablet 0  . guaiFENesin (MUCINEX) 600 MG 12 hr tablet Take 1 tablet (600 mg total) by mouth 2 (two) times daily. 30 tablet 0  . meloxicam (MOBIC) 7.5 MG tablet Take 1 tablet (7.5 mg total) by mouth daily. 30 tablet 0  . metFORMIN (GLUCOPHAGE-XR) 500 MG 24 hr tablet Take 1 tablet (500 mg total) by mouth 2 (two) times daily. 180 tablet 0  . methylPREDNISolone (MEDROL DOSEPAK) 4 MG TBPK tablet Take as  directed 21 tablet 0  . metolazone (ZAROXOLYN) 2.5 MG tablet Take 1 tablet (2.5 mg total) by mouth every Monday, Wednesday, and Friday. Take 30 mins prior to taking Torsemide 45 tablet 3  . mirtazapine (REMERON SOL-TAB) 45 MG disintegrating tablet Take 1 tablet (45 mg total) by mouth at bedtime. 90 tablet 0  . montelukast (SINGULAIR) 10 MG tablet Take 1 tablet (10 mg total) by mouth at bedtime. 90 tablet 1  . Multiple Vitamin (MULTIVITAMIN WITH MINERALS) TABS tablet Take 1 tablet by mouth daily.    [name] omeprazole (PRILOSEC) 40 MG capsule Take 1 capsule (40 mg total) by mouth 2 (two) times daily. in the morning and evebing 56 capsule 2  . OXYGEN Inhale 6 L into the lungs daily.    . potassium chloride (K-DUR) 10 MEQ tablet Take 6 tablets (60 mEq total) by mouth every morning AND 6 tablets (60 mEq total) every evening. May also take 4 tablets (40 mEq total) at bedtime as needed ([place]tih Metolazone on M/W/F). 450 tablet 6  . tiotropium (SPIRIVA HANDIHALER) 18 MCG inhalation capsule Place 1 capsule (18 mcg total) into inhaler and inhale daily. 90 capsule 1  . torsemide (DEMADEX) 20 MG tablet Take 4 tablets (80 mg total) by mouth 2 (two) times daily. 240 tablet 6   No current facility-administered medications for this visit.      Musculoskeletal: Strength & Muscle Tone: within normal limits Gait & Station: unsteady Patient leans: Front and Backward  Psychiatric Specialty Exam: Review of Systems  Constitutional:       Feeling tired.  Respiratory: Positive for shortness of breath.   Musculoskeletal: Positive for back pain and joint pain.  Neurological: Positive for dizziness.  Psychiatric/Behavioral: The patient has insomnia.     Blood pressure 132/80, pulse 86, height 5' 4" (1.626 m), weight 295 lb (133.8 kg).There is no height or weight on file to calculate BMI.  General Appearance: Casual and Using oxygen  Eye Contact:  Fair  Speech:  Slow  Volume:  Decreased  Mood:  Anxious and Tired   Affect:  Constricted  Thought Process:  Goal Directed  Orientation:  Full (Time, Place, and Person)  Thought Content: Rumination   Suicidal Thoughts:  No  Homicidal Thoughts:  No  Memory:  Immediate;   Fair Recent;   Fair Remote;   Fair  Judgement:  Fair  Insight:  Good  Psychomotor Activity:  Decreased  Concentration:  Concentration: Fair and Attention Span: Fair  Recall:  [employer] of Knowledge: Good  Language: Good  Akathisia:  No  Handed:  Right  AIMS (if indicated): not done  Assets:  Communication Skills Desire for Improvement Housing  ADL's:  Intact  Cognition: WNL  Sleep:  Fair   Screenings: AUDIT     Admission (Discharged) from 12/01/2015 in [place]  Alcohol Use Disorder Identification Test Final Score (AUDIT)  0    PHQ2-9     Office Visit from 01/10/2017 in [place] Office Visit from  12/15/2016 in [place] Office Visit from 12/05/2016 in [place] Office Visit from 12/01/2016 in [place] Office Visit from 11/23/2016 in [place]  PHQ-2 Total Score  0  0  0  0  0       Assessment and Plan: Schizophrenia chronic paranoid type.  Cocaine dependence in complete remission.  I reviewed her blood work results, collect information, discharge summary and current medication.  She now have gout.  Her uric acid is 13.3.  Her potassium is 3.0.  BUN 21 and creatinine 1.55.  Her GFR is 45.  [name] taking multiple medication including gabapentin, Mobic.  We discussed polypharmacy in detail.  Patient like to go back on Geodon.  She had a good response but it was discontinued due to QTC.  Patient has upcoming appointment with the cardiology.  I will discontinue Abilify and restart low-dose Geodon.  Patient agreed with Geodon 60 mg.  Once she is cleared from cardiology then we may consider going up on Geodon.  Patient will discuss this  on her upcoming cardiology appointment.  I will also forward my note to her cardiologist Dr. Shaune Spittle.  Continue Remeron 45 mg at bedtime and Lexapro 20 mg daily.  We discussed possible hypertension, fatigue and feeling tiredness due to her physical condition.  Patient is not interested in counseling at this time.  Discussed risk and benefits of the medication.  Discussed safety concerns at any time having active suicidal thoughts or homicidal thought that she need to call 911 or go to local emergency room.  Follow-up in 3 months.  Time spent 25 minutes.  More than 50% of the time spent in psychoeducation, coordination, discussing long-term prognosis and polypharmacy.   Kathlee Nations, MD 02/16/2017, 11:08 AM

## 2017-02-21 ENCOUNTER — Telehealth: Payer: Self-pay

## 2017-02-21 DIAGNOSIS — R0602 Shortness of breath: Secondary | ICD-10-CM | POA: Diagnosis not present

## 2017-02-21 DIAGNOSIS — J189 Pneumonia, unspecified organism: Secondary | ICD-10-CM | POA: Diagnosis not present

## 2017-02-21 DIAGNOSIS — R062 Wheezing: Secondary | ICD-10-CM | POA: Diagnosis not present

## 2017-02-21 DIAGNOSIS — J440 Chronic obstructive pulmonary disease with acute lower respiratory infection: Secondary | ICD-10-CM | POA: Diagnosis not present

## 2017-02-21 DIAGNOSIS — M6281 Muscle weakness (generalized): Secondary | ICD-10-CM | POA: Diagnosis not present

## 2017-02-21 DIAGNOSIS — I509 Heart failure, unspecified: Secondary | ICD-10-CM | POA: Diagnosis not present

## 2017-02-21 DIAGNOSIS — J449 Chronic obstructive pulmonary disease, unspecified: Secondary | ICD-10-CM | POA: Diagnosis not present

## 2017-02-21 DIAGNOSIS — J45909 Unspecified asthma, uncomplicated: Secondary | ICD-10-CM | POA: Diagnosis not present

## 2017-02-21 DIAGNOSIS — G4733 Obstructive sleep apnea (adult) (pediatric): Secondary | ICD-10-CM | POA: Diagnosis not present

## 2017-02-21 NOTE — Telephone Encounter (Signed)
   [facility] Pre-operative Risk Assessment    Request for surgical clearance:  1. What type of surgery is being performed? Tooth extration with alveoloplasty on the upper right and left and lower left and right   2. When is this surgery scheduled? TBD  3. What type of clearance is required (medical clearance vs. Pharmacy clearance to hold med vs. Both)? Medical   4. Are there any medications that need to be held prior to surgery and how long? Non listed   5. Practice name and name of physician performing surgery?  [facility] 2. [facility]    6. What is your office phone and fax number?  1. Phone: [phone] 2. Fax [phone]  7. Anesthesia type (None, local, MAC, general) ? Local    _________________________________________________________________   (provider comments below)

## 2017-02-21 NOTE — Telephone Encounter (Signed)
   Primary Cardiologist: Fransico Him, MD  Chart reviewed as part of pre-operative protocol coverage. Patient was contacted 02/21/2017 in reference to pre-operative risk assessment for pending surgery as outlined below.  Tiffany [name] was last seen on 01/19/17 by Lyda Jester.  Since that day, Tiffany [name] has done well. Her functional capacity is severely diminished, but this has not changed since she was last seen in clinic. She reports no new symptoms.  Therefore, based on ACC/AHA guidelines, the patient would be at acceptable risk for the planned procedure without further cardiovascular testing.   I will route this recommendation to the requesting party via Epic fax function and remove from pre-op pool.  Preop call back pool, please call office and make sure they received this documentation.  Please call with questions.  Tami Lin Duke, PA 02/21/2017, 2:59 PM

## 2017-02-21 NOTE — Telephone Encounter (Signed)
Faxed preop letter and verified by office staff notes were received.  

## 2017-02-22 ENCOUNTER — Ambulatory Visit (HOSPITAL_COMMUNITY): Payer: Self-pay | Admitting: Licensed Clinical Social Worker

## 2017-02-22 DIAGNOSIS — F064 Anxiety disorder due to known physiological condition: Secondary | ICD-10-CM | POA: Diagnosis not present

## 2017-02-22 DIAGNOSIS — F331 Major depressive disorder, recurrent, moderate: Secondary | ICD-10-CM | POA: Diagnosis not present

## 2017-02-22 DIAGNOSIS — F5081 Binge eating disorder: Secondary | ICD-10-CM | POA: Diagnosis not present

## 2017-02-22 DIAGNOSIS — F411 Generalized anxiety disorder: Secondary | ICD-10-CM | POA: Diagnosis not present

## 2017-02-23 ENCOUNTER — Other Ambulatory Visit: Payer: Self-pay

## 2017-02-23 ENCOUNTER — Telehealth: Payer: Self-pay | Admitting: Adult Health

## 2017-02-23 ENCOUNTER — Ambulatory Visit (INDEPENDENT_AMBULATORY_CARE_PROVIDER_SITE_OTHER): Payer: Medicare HMO | Admitting: Internal Medicine

## 2017-02-23 VITALS — BP 120/80 | HR 88 | Temp 98.3°F | Wt 300.6 lb

## 2017-02-23 DIAGNOSIS — I5032 Chronic diastolic (congestive) heart failure: Secondary | ICD-10-CM

## 2017-02-23 DIAGNOSIS — J42 Unspecified chronic bronchitis: Secondary | ICD-10-CM

## 2017-02-23 DIAGNOSIS — M1990 Unspecified osteoarthritis, unspecified site: Secondary | ICD-10-CM | POA: Diagnosis not present

## 2017-02-23 NOTE — Progress Notes (Signed)
   [name] [name], MD Phone: [phone]  Reason For Visit: Follow up   # Arthritis  -Has an appointment with the rheumatologist. -She has had lab work done for the visit already. -She was found to have elevated uric acid levels and they have placed her on colchicine.  #CHF  -Has been taking her torsemide 80 mg twice daily, metolazone 2.5 mg Monday Wednesday and Friday -Feels like she has been diuresing okay - Patient has been difficulty breathing the past couple of days - Dry Weight is about 292 lbs however today she is currently 300 pounds - Denies eating a bunch of salt and endorses drink a lot of soda  - Feels tight in her abdomen, notes some bilateral LE - No new medications other than to colchicine  Past Medical History Reviewed problem list.  Medications- reviewed and updated No additions to family history Social history- patient is a non-smoker  Objective: BP 120/80 (BP Location: Left Wrist, Patient Position: Sitting, Cuff Size: Normal)   Pulse 88   Temp 98.3 F (36.8 C) (Oral)   Wt (!) 300 lb 9.6 oz (136.4 kg)   SpO2 95%   BMI 51.60 kg/m  Gen: 6 Liters of oxygen, NAD  Cardio: regular rate and rhythm, S1S2 heard, no murmurs appreciated Pulm: clear to auscultation bilaterally, no wheezes, rhonchi or rales Extremities: warm, well perfused, bilateral 1-2+ mid calf edema, no significant thigh edema noted, Skin: dry, intact, no rashes or lesions  Ambulated around the office: Sats maintained 93% on home 6L of oxygen   Assessment/Plan: See problem based a/p  Chronic diastolic congestive heart failure (HCC) Likely with a mild CHF exacerbation her dry weight is about 292 pounds and she is currently 300 pounds today.  [name] notable for some 1-2+ pitting edema to mid calf.  No desats on home O2 of 6 L.  Patient able to ambulate maintaining sats of 93% on home O2.  No crackles noted on lung exam. -Continue torsemide 80 mg twice daily,  take metolazone 2.5 mg Saturday and Sunday -Follow up on Monday either with cardiologist    Arthritis Following with rheumatology Apparently treating for elevated uric acid with colchicine will continue to follow along

## 2017-02-23 NOTE — Patient Instructions (Addendum)
Please take Metalozone on Saturday and Sunday. Please follow up on Monday either with [facility] and cardiology to recheck on how you are doing. Please make sure to decrease your coke in take.

## 2017-02-23 NOTE — Telephone Encounter (Signed)
Pt is calling back [phone]

## 2017-02-23 NOTE — Telephone Encounter (Signed)
ATC pt, no answer. Left message for pt to call back.  

## 2017-02-23 NOTE — Telephone Encounter (Signed)
Spoke with patient. She was seen on 01/30/17 by TP. An order was placed for the patient to receive a motorized wheelchair from [facility], but she has not heard anything from [facility] regarding this order. Advised patient I would call [facility] to see what happened with her order. She verbalized understanding.   Spoke with Mateo Flow from [facility]. She did state that she found an order in the system for the patient. She reached out to the logistics department to check on the status of the order.   Was on hold for 15 after being transferred to the power mobility dept. Left message for a rep to call back.

## 2017-02-24 DIAGNOSIS — G4733 Obstructive sleep apnea (adult) (pediatric): Secondary | ICD-10-CM | POA: Diagnosis not present

## 2017-02-24 DIAGNOSIS — R062 Wheezing: Secondary | ICD-10-CM | POA: Diagnosis not present

## 2017-02-24 DIAGNOSIS — R0602 Shortness of breath: Secondary | ICD-10-CM | POA: Diagnosis not present

## 2017-02-24 DIAGNOSIS — J440 Chronic obstructive pulmonary disease with acute lower respiratory infection: Secondary | ICD-10-CM | POA: Diagnosis not present

## 2017-02-24 DIAGNOSIS — J189 Pneumonia, unspecified organism: Secondary | ICD-10-CM | POA: Diagnosis not present

## 2017-02-24 DIAGNOSIS — J45909 Unspecified asthma, uncomplicated: Secondary | ICD-10-CM | POA: Diagnosis not present

## 2017-02-24 LAB — BASIC METABOLIC PANEL
BUN/Creatinine Ratio: 17 (ref 9–23)
BUN: 26 mg/dL — ABNORMAL HIGH (ref 6–24)
CO2: 32 mmol/L — ABNORMAL HIGH (ref 20–29)
Calcium: 9.8 mg/dL (ref 8.7–10.2)
Chloride: 96 mmol/L (ref 96–106)
Creatinine, Ser: 1.52 mg/dL — ABNORMAL HIGH (ref 0.57–1.00)
GFR calc Af Amer: 44 mL/min/{1.73_m2} — ABNORMAL LOW (ref >59)
GFR calc non Af Amer: 38 mL/min/{1.73_m2} — ABNORMAL LOW (ref >59)
Glucose: 90 mg/dL (ref 65–99)
Potassium: 3.4 mmol/L — ABNORMAL LOW (ref 3.5–5.2)
Sodium: 145 mmol/L — ABNORMAL HIGH (ref 134–144)

## 2017-02-25 NOTE — Assessment & Plan Note (Signed)
Following with rheumatology Apparently treating for elevated uric acid with colchicine will continue to follow along

## 2017-02-25 NOTE — Assessment & Plan Note (Signed)
Likely with a mild CHF exacerbation her dry weight is about 292 pounds and she is currently 300 pounds today.  [name] notable for some 1-2+ pitting edema to mid calf.  No desats on home O2 of 6 L.  Patient able to ambulate maintaining sats of 93% on home O2.  No crackles noted on lung exam. -Continue torsemide 80 mg twice daily, take metolazone 2.5 mg Saturday and Sunday -Follow up on Monday either with cardiologist

## 2017-02-26 ENCOUNTER — Other Ambulatory Visit: Payer: Self-pay

## 2017-02-26 ENCOUNTER — Encounter: Payer: Self-pay | Admitting: Family Medicine

## 2017-02-26 ENCOUNTER — Ambulatory Visit (INDEPENDENT_AMBULATORY_CARE_PROVIDER_SITE_OTHER): Payer: Medicare HMO | Admitting: Family Medicine

## 2017-02-26 DIAGNOSIS — I5032 Chronic diastolic (congestive) heart failure: Secondary | ICD-10-CM | POA: Diagnosis not present

## 2017-02-26 NOTE — Telephone Encounter (Signed)
An order for a motorized wheelchair is not showing up in the referrals.  I checked TP's ov note from 1/15 & do see where an order was placed.  I don't know if order was canceled??  Can you please put in a new order.

## 2017-02-26 NOTE — Progress Notes (Signed)
Subjective:    Patient ID: Tiffany [name] , female   DOB: [birth year] , 57 y.o..   MRN: [id number]  HPI  Tiffany [name] is a 57 yo F with PMH of chronic diastolic heart failure, hypertension, OSA, COPD, type 2 diabetes, CKD, chronic pain, morbid obesity here for  Chief Complaint  Patient presents with  . CHF weight check    1.  CHF follow-up: Patient was last seen 3 days ago by Dr. Emmaline Life.  At that time she was up from her dry weight at 300 pounds.  Her dry weight is around 292 pounds.  Is recommended that she continue taking torsemide 80 mg twice daily and metolazone 2.5 mg Monday Wednesday and Friday. She also took it on Saturday and Sunday.  She has been compliant with her medications.  She notes that she does not drink much water.  She has been on her home 6 L nasal cannula oxygen.  She denies any chest pain, wheezing, shortness of breath.  Review of Systems: Per HPI.   Past Medical History: Patient Active Problem List   Diagnosis Date Noted  . Syncope 01/06/2017  . Hypomagnesemia   . Prolonged Q-T interval on ECG   . CKD (chronic kidney disease) 12/15/2016  . Carotid artery stenosis   . Right foot pain 11/15/2016  . Right carotid bruit 11/09/2016  . Hypokalemia   . Osteoarthritis 10/26/2016  . Dyspnea 10/07/2016  . Cardiac arrest ([place]) 09/08/2016  . History of open reduction and internal fixation (ORIF) procedure   . Closed displaced transverse fracture of shaft of right tibia   . Fall   . Overactive bladder 06/07/2016  . QT prolongation   . OSA and COPD overlap syndrome ([place])   . Generalized weakness   . Arthritis   . Bipolar I disorder ([place])   . Essential hypertension 03/22/2016  . Bipolar affective disorder, mixed, severe, with psychotic behavior ([place]) 11/28/2015  . Cocaine use disorder, severe, dependence ([place]) 11/28/2015  . Chronic diastolic congestive heart failure ([place])   . Chronic respiratory failure with hypoxia and hypercapnia ([place]) 06/22/2015  .  Onychomycosis 01/21/2015  . Nasal congestion 12/17/2014  . Tobacco use disorder 07/22/2014  . COPD (chronic obstructive pulmonary disease) ([place]) 07/08/2014  . Knee pain, bilateral 01/22/2013  . Seizure ([place]) 01/04/2013  . Health care maintenance 11/25/2012  . History of kidney stones 06/18/2012  . Chronic pain syndrome 06/18/2012  . Allergic reaction 04/07/2012  . HSV infection 08/30/2011  . Dyslipidemia 04/24/2011  . Anemia 04/24/2011  . Diabetic neuropathy ([place]) 04/24/2011  . Obstructive sleep apnea 10/18/2010  . Asthma 10/18/2010  . Morbid obesity ([place]) 10/18/2010  . Type 2 diabetes mellitus ([place]) 10/18/2010    Medications: reviewed   Social Hx:  reports that she quit smoking about 10 months ago. Her smoking use included cigarettes. She started smoking about 39 years ago. She has a 57.00 pack-year smoking history. she has never used smokeless tobacco.   Objective:   BP (!) 100/58   Pulse 86   Temp 98 F (36.7 C) (Oral)   Ht 5\' 4"  (1.626 m)   Wt 294 lb (133.4 kg)   SpO2 99%   BMI 50.46 kg/m  Physical Exam  Gen: NAD, alert, cooperative with exam, well-appearing, obese Cardiac: Regular rate and rhythm, normal S1/S2, no murmur, no edema, capillary refill brisk  Respiratory: Clear to auscultation bilaterally, no wheezes, non-labored breathing, 6L of O2 via [place] Gastrointestinal: soft, non tender, non distended, bowel sounds  present Skin: no rashes, normal turgor  Psych: good insight, normal mood and affect  Assessment & Plan:  Chronic diastolic congestive heart failure ([place]) Patient has diuresed nicely with the extra dose of metolazone on February 9 and 10.  Her weight is 294 today (dry weight is 292).  She has no edema on exam and her lungs are clear to auscultation.  [name] on her home dose of oxygen at 6 L nasal cannula. -Continue torsemide 80 mg daily and metolazone 2.5 mg Monday Wednesday Friday -She has follow-up with Dr. Emmaline Life in about 10 days, discussed reasons to  return to clinic sooner including return of her edema, hypoxia, chest pain, dyspnea, or weight gain. -Her blood pressure is low normal today at 100/58 (not usual per her report), can consider discontinuing amlodipine if her blood pressure remains low at follow-up   Smitty Cords, MD [place], PGY-3

## 2017-02-26 NOTE — Telephone Encounter (Signed)
Yes, it is there and we are having to hold it til it is signed.

## 2017-02-26 NOTE — Telephone Encounter (Signed)
Called [facility] and spoke to [place], who transferred me to the power mobility dept.  Spoke with [place], who states that she doesn't have an order for this.   PCCs please advise if 1/17 order for dme wheelchair can be refaxed to [facility].  thanks

## 2017-02-26 NOTE — Assessment & Plan Note (Addendum)
Patient has diuresed nicely with the extra dose of metolazone on February 9 and 10.  Her weight is 294 today (dry weight is 292).  She has no edema on exam and her lungs are clear to auscultation.  [name] on her home dose of oxygen at 6 L nasal cannula. -Continue torsemide 80 mg daily and metolazone 2.5 mg Monday Wednesday Friday -She has follow-up with Dr. Emmaline Life in about 10 days, discussed reasons to return to clinic sooner including return of her edema, hypoxia, chest pain, dyspnea, or weight gain. -Her blood pressure is low normal today at 100/58 (not usual per her report), can consider discontinuing amlodipine if her blood pressure remains low at follow-up

## 2017-02-26 NOTE — Patient Instructions (Addendum)
Thank you for coming in today, it was so nice to see you! Today we talked about:    Leg swelling and weight:   I am glad that you are leg swelling is also improved.  Your weight is a posterior dry weight.  Continue taking the torsemide 80 mg twice daily and the metolazone 2.5 mg Monday Wednesday and Friday.  Please come see [name] if your swelling returns or your weight goes up  Please follow up with Dr. Emmaline Life at your next scheduled appointment.   If you have any questions or concerns, please do not hesitate to call the office at [phone]. You can also message me directly via MyChart.   Sincerely,  Smitty Cords, MD

## 2017-02-26 NOTE — Telephone Encounter (Signed)
Order placed for pt to receive motorized wheelchair.  Judeen Hammans, please advise if you are able to see the order.  Thanks!

## 2017-02-27 ENCOUNTER — Other Ambulatory Visit (HOSPITAL_COMMUNITY): Payer: Self-pay | Admitting: Psychiatry

## 2017-02-27 ENCOUNTER — Telehealth: Payer: Self-pay | Admitting: Internal Medicine

## 2017-02-27 ENCOUNTER — Other Ambulatory Visit: Payer: Self-pay | Admitting: Internal Medicine

## 2017-02-27 NOTE — Telephone Encounter (Signed)
[facility] has sent paperwork needed to have dr sign for pt to receive home health care. The form was faxed Thursday at 2:30. Please let pt know when the form has been signed and returned to [facility].

## 2017-02-28 ENCOUNTER — Other Ambulatory Visit: Payer: Self-pay | Admitting: *Deleted

## 2017-02-28 DIAGNOSIS — E1122 Type 2 diabetes mellitus with diabetic chronic kidney disease: Secondary | ICD-10-CM | POA: Diagnosis not present

## 2017-02-28 DIAGNOSIS — F209 Schizophrenia, unspecified: Secondary | ICD-10-CM | POA: Diagnosis not present

## 2017-02-28 DIAGNOSIS — F319 Bipolar disorder, unspecified: Secondary | ICD-10-CM | POA: Diagnosis not present

## 2017-02-28 DIAGNOSIS — R55 Syncope and collapse: Secondary | ICD-10-CM | POA: Diagnosis not present

## 2017-02-28 DIAGNOSIS — D631 Anemia in chronic kidney disease: Secondary | ICD-10-CM | POA: Diagnosis not present

## 2017-02-28 DIAGNOSIS — I130 Hypertensive heart and chronic kidney disease with heart failure and stage 1 through stage 4 chronic kidney disease, or unspecified chronic kidney disease: Secondary | ICD-10-CM | POA: Diagnosis not present

## 2017-02-28 DIAGNOSIS — N183 Chronic kidney disease, stage 3 (moderate): Secondary | ICD-10-CM | POA: Diagnosis not present

## 2017-02-28 DIAGNOSIS — J449 Chronic obstructive pulmonary disease, unspecified: Secondary | ICD-10-CM | POA: Diagnosis not present

## 2017-02-28 DIAGNOSIS — I5032 Chronic diastolic (congestive) heart failure: Secondary | ICD-10-CM | POA: Diagnosis not present

## 2017-02-28 MED ORDER — GABAPENTIN 600 MG PO TABS: 600.0000 mg | ORAL_TABLET | Freq: Three times a day (TID) | ORAL | 0 refills | Status: DC

## 2017-03-01 NOTE — Telephone Encounter (Signed)
Attempted to LMOVM but it was full. Will try again later. Leondra Cullin Kennon Holter, CMA

## 2017-03-01 NOTE — Telephone Encounter (Signed)
Form completed and faxed. Please let patient know. Thanks Asiyah

## 2017-03-02 ENCOUNTER — Telehealth (INDEPENDENT_AMBULATORY_CARE_PROVIDER_SITE_OTHER): Payer: Self-pay | Admitting: Orthopaedic Surgery

## 2017-03-02 NOTE — Telephone Encounter (Signed)
Called patient and she States PCP they don't know how to mange this. I advised her that I will let you know.

## 2017-03-02 NOTE — Telephone Encounter (Signed)
Ok to refill colcrys (just like instructions on last rx, but do until symptoms resolve, but no longer than 7 days).  Also, have her go ahead and call primary care doctor to set up appt as we would like for them to manage this.

## 2017-03-02 NOTE — Telephone Encounter (Signed)
colcrys med made pt  sick   Methylprednisone med made her stomach hurt   Pt stated her gout symptoms or not clearing up

## 2017-03-02 NOTE — Telephone Encounter (Signed)
Per Mendel Ryder she should follow up on this with PCP.

## 2017-03-02 NOTE — Telephone Encounter (Signed)
[facility] called and said the paperwork was not filled out properly. She say's in the 'change of status' section, it must say that due to the change in needs because of her CHF that this pt needs more assistance with bathing, dressing, mobility and meals being prepared.  Also in step 5 it's missing the practice NPI number and the contact name for our practice. I have placed the fax back in [place] box to be changed.

## 2017-03-02 NOTE — Telephone Encounter (Signed)
Pt called and said they did not receive this fax and it was supposed to be faxed with ATTN: Lucillie Garfinkel. I found it in the past faxes and resent the fax to the attention of Treva. Just wanted to put this in for documentation.

## 2017-03-02 NOTE — Telephone Encounter (Signed)
See message below °

## 2017-03-04 DIAGNOSIS — G4733 Obstructive sleep apnea (adult) (pediatric): Secondary | ICD-10-CM | POA: Diagnosis not present

## 2017-03-04 DIAGNOSIS — I509 Heart failure, unspecified: Secondary | ICD-10-CM | POA: Diagnosis not present

## 2017-03-04 DIAGNOSIS — J189 Pneumonia, unspecified organism: Secondary | ICD-10-CM | POA: Diagnosis not present

## 2017-03-04 DIAGNOSIS — J449 Chronic obstructive pulmonary disease, unspecified: Secondary | ICD-10-CM | POA: Diagnosis not present

## 2017-03-04 DIAGNOSIS — R0602 Shortness of breath: Secondary | ICD-10-CM | POA: Diagnosis not present

## 2017-03-04 DIAGNOSIS — M6281 Muscle weakness (generalized): Secondary | ICD-10-CM | POA: Diagnosis not present

## 2017-03-04 DIAGNOSIS — J45909 Unspecified asthma, uncomplicated: Secondary | ICD-10-CM | POA: Diagnosis not present

## 2017-03-04 DIAGNOSIS — J440 Chronic obstructive pulmonary disease with acute lower respiratory infection: Secondary | ICD-10-CM | POA: Diagnosis not present

## 2017-03-04 DIAGNOSIS — R062 Wheezing: Secondary | ICD-10-CM | POA: Diagnosis not present

## 2017-03-05 NOTE — Telephone Encounter (Addendum)
Filled out per request and placed in fax area

## 2017-03-05 NOTE — Telephone Encounter (Signed)
They should definitely know how to manage this.  And we feel more comfortable with them doing this than [name] managing.  We just do acute flare up.  We do not manage.

## 2017-03-05 NOTE — Telephone Encounter (Signed)
Called Patient to advise on Lindsey's message

## 2017-03-06 DIAGNOSIS — R350 Frequency of micturition: Secondary | ICD-10-CM | POA: Diagnosis not present

## 2017-03-06 DIAGNOSIS — R351 Nocturia: Secondary | ICD-10-CM | POA: Diagnosis not present

## 2017-03-08 ENCOUNTER — Ambulatory Visit (HOSPITAL_COMMUNITY)
Admission: RE | Admit: 2017-03-08 | Discharge: 2017-03-08 | Disposition: A | Discharge status: Home / Self Care | Payer: Medicare HMO | Source: Ambulatory Visit | Attending: Cardiology | Admitting: Cardiology

## 2017-03-08 VITALS — BP 116/72 | HR 79 | Wt 295.0 lb

## 2017-03-08 DIAGNOSIS — Z87891 Personal history of nicotine dependence: Secondary | ICD-10-CM | POA: Insufficient documentation

## 2017-03-08 DIAGNOSIS — J42 Unspecified chronic bronchitis: Secondary | ICD-10-CM

## 2017-03-08 DIAGNOSIS — J449 Chronic obstructive pulmonary disease, unspecified: Secondary | ICD-10-CM | POA: Diagnosis not present

## 2017-03-08 DIAGNOSIS — N183 Chronic kidney disease, stage 3 (moderate): Secondary | ICD-10-CM | POA: Insufficient documentation

## 2017-03-08 DIAGNOSIS — I10 Essential (primary) hypertension: Secondary | ICD-10-CM

## 2017-03-08 DIAGNOSIS — Z885 Allergy status to narcotic agent status: Secondary | ICD-10-CM | POA: Insufficient documentation

## 2017-03-08 DIAGNOSIS — Z8673 Personal history of transient ischemic attack (TIA), and cerebral infarction without residual deficits: Secondary | ICD-10-CM | POA: Diagnosis not present

## 2017-03-08 DIAGNOSIS — Z9981 Dependence on supplemental oxygen: Secondary | ICD-10-CM | POA: Diagnosis not present

## 2017-03-08 DIAGNOSIS — Z882 Allergy status to sulfonamides status: Secondary | ICD-10-CM | POA: Insufficient documentation

## 2017-03-08 DIAGNOSIS — N179 Acute kidney failure, unspecified: Secondary | ICD-10-CM | POA: Diagnosis not present

## 2017-03-08 DIAGNOSIS — E1122 Type 2 diabetes mellitus with diabetic chronic kidney disease: Secondary | ICD-10-CM | POA: Diagnosis not present

## 2017-03-08 DIAGNOSIS — R55 Syncope and collapse: Secondary | ICD-10-CM | POA: Diagnosis not present

## 2017-03-08 DIAGNOSIS — I5032 Chronic diastolic (congestive) heart failure: Secondary | ICD-10-CM | POA: Insufficient documentation

## 2017-03-08 DIAGNOSIS — Z79899 Other long term (current) drug therapy: Secondary | ICD-10-CM | POA: Insufficient documentation

## 2017-03-08 DIAGNOSIS — Z8674 Personal history of sudden cardiac arrest: Secondary | ICD-10-CM | POA: Diagnosis not present

## 2017-03-08 DIAGNOSIS — F329 Major depressive disorder, single episode, unspecified: Secondary | ICD-10-CM | POA: Insufficient documentation

## 2017-03-08 DIAGNOSIS — F419 Anxiety disorder, unspecified: Secondary | ICD-10-CM | POA: Insufficient documentation

## 2017-03-08 DIAGNOSIS — G4733 Obstructive sleep apnea (adult) (pediatric): Secondary | ICD-10-CM | POA: Diagnosis not present

## 2017-03-08 DIAGNOSIS — F1410 Cocaine abuse, uncomplicated: Secondary | ICD-10-CM | POA: Insufficient documentation

## 2017-03-08 DIAGNOSIS — Z7984 Long term (current) use of oral hypoglycemic drugs: Secondary | ICD-10-CM | POA: Diagnosis not present

## 2017-03-08 DIAGNOSIS — I252 Old myocardial infarction: Secondary | ICD-10-CM | POA: Insufficient documentation

## 2017-03-08 DIAGNOSIS — K219 Gastro-esophageal reflux disease without esophagitis: Secondary | ICD-10-CM | POA: Diagnosis not present

## 2017-03-08 DIAGNOSIS — Z7982 Long term (current) use of aspirin: Secondary | ICD-10-CM | POA: Insufficient documentation

## 2017-03-08 DIAGNOSIS — I130 Hypertensive heart and chronic kidney disease with heart failure and stage 1 through stage 4 chronic kidney disease, or unspecified chronic kidney disease: Secondary | ICD-10-CM | POA: Insufficient documentation

## 2017-03-08 DIAGNOSIS — D649 Anemia, unspecified: Secondary | ICD-10-CM | POA: Diagnosis not present

## 2017-03-08 DIAGNOSIS — E785 Hyperlipidemia, unspecified: Secondary | ICD-10-CM | POA: Insufficient documentation

## 2017-03-08 DIAGNOSIS — F319 Bipolar disorder, unspecified: Secondary | ICD-10-CM | POA: Diagnosis not present

## 2017-03-08 DIAGNOSIS — D631 Anemia in chronic kidney disease: Secondary | ICD-10-CM | POA: Diagnosis not present

## 2017-03-08 DIAGNOSIS — I6529 Occlusion and stenosis of unspecified carotid artery: Secondary | ICD-10-CM

## 2017-03-08 DIAGNOSIS — R0602 Shortness of breath: Secondary | ICD-10-CM | POA: Diagnosis not present

## 2017-03-08 DIAGNOSIS — F209 Schizophrenia, unspecified: Secondary | ICD-10-CM | POA: Insufficient documentation

## 2017-03-08 DIAGNOSIS — R0989 Other specified symptoms and signs involving the circulatory and respiratory systems: Secondary | ICD-10-CM | POA: Insufficient documentation

## 2017-03-08 LAB — BASIC METABOLIC PANEL
Anion gap: 13 (ref 5–15)
BUN: 26 mg/dL — ABNORMAL HIGH (ref 6–20)
CO2: 35 mmol/L — ABNORMAL HIGH (ref 22–32)
Calcium: 9.6 mg/dL (ref 8.9–10.3)
Chloride: 95 mmol/L — ABNORMAL LOW (ref 101–111)
Creatinine, Ser: 1.39 mg/dL — ABNORMAL HIGH (ref 0.44–1.00)
GFR calc Af Amer: 48 mL/min — ABNORMAL LOW (ref >60)
GFR calc non Af Amer: 41 mL/min — ABNORMAL LOW (ref >60)
Glucose, Bld: 89 mg/dL (ref 65–99)
Potassium: 2.8 mmol/L — ABNORMAL LOW (ref 3.5–5.1)
Sodium: 143 mmol/L (ref 135–145)

## 2017-03-08 NOTE — Patient Instructions (Signed)
Routine lab work today. Will notify you of abnormal results, otherwise no news is good news!  Follow up 3 months with Dr. Haroldine Laws.  _________________________________________________ Tiffany [name] Code:1200  Take all medication as prescribed the day of your appointment. Bring all medications with you to your appointment.  Do the following things EVERYDAY: 1) Weigh yourself in the morning before breakfast. Write it down and keep it in a log. 2) Take your medicines as prescribed 3) Eat low salt foods-Limit salt (sodium) to 2000 mg per day.  4) Stay as active as you can everyday 5) Limit all fluids for the day to less than 2 liters

## 2017-03-08 NOTE — Progress Notes (Signed)
Advanced Heart Failure Clinic Note   Primary Care: Asiyah Cletis Media, MD         Primary Cardiologist: [name]  HPI:  Tiffany [name] is a 57 y.o. female with a hx of asthma, COPD on chronic supplemental O2, chronic diastolic CHF, prior MI, HTN, hyperlipidemia, type 2 diabetes, history of cocaine abuse, CKD stage II,anemiaand anxiety.   Last seen in [facility] office 11/09/16. Visit notable for marked dietary non-compliance and fluid retention.   Seen by PCP 12/01/16 and Metolazone cut back with AKI.   She presents today for follow up.  Recently took extra torsemide and feels like weight/fluid are back to normal.  She continues to regularly eat fast food. Does not wear CPAP everynight. Denies lightheadedness or dizziness. [name] getting an electric wheelchair due to her increased fall risk. Uses a wheel chair the majority of the time. She states she saw [facility] and was told she didn't need "that kind of a kidney doctor". [name] not sure what they meant.  Denies CP or palpitations.   Echo 10/30/16 with LVEF 60-65%, mild LAE.   Review of systems complete and found to be negative unless listed in HPI.    Past Medical History:  Diagnosis Date  . Anxiety   . Arthritis    "all over" (04/10/2016)  . Asthma   . Cardiac arrest ([place]) 09/08/2016   PEA  . Carotid artery stenosis    1-39% bilateral by dopplers 11/2016  . Chronic bronchitis ([place])   . Chronic diastolic (congestive) heart failure ([place])   . Chronic kidney disease    "I see a kidney dr." (04/10/2016)  . Cocaine abuse ([place])   . Complication of anesthesia    decreased bp, decreased heart rate  . Depression   . Disorder of nervous system   . Emphysema   . GERD (gastroesophageal reflux disease)   . Heart attack ([place]) 1980s  . History of blood transfusion 1994   "couldn't stop bleeding from my period"  . Hyperlipidemia LDL goal <70   . Hypertension   . Incontinence   . Manic depression ([place])   . On home oxygen therapy    "6L;  24/7" (04/10/2016)  . OSA on CPAP    "wear mask sometimes" (04/10/2016)  . Paranoid ([place])    "sometimes; I'm on RX for it" (04/10/2016)  . Pneumonia    "I've had it several times; haven't had it since 06/2015" (04/10/2016)  . Schizophrenia ([place])   . Seasonal allergies   . Seizures ([place])    "don't know what kind; last one was ~ 1 yr ago" (04/10/2016)  . Sinus trouble   . Stroke [facility]) 1980s   denies residual on 04/10/2016  . Type II diabetes mellitus ([place])     Current Outpatient Medications  Medication Sig Dispense Refill  . acetaminophen (TYLENOL) 500 MG tablet Take 1 tablet (500 mg total) by mouth every 6 (six) hours as needed. 60 tablet 0  . albuterol (PROVENTIL HFA;VENTOLIN HFA) 108 (90 Base) MCG/ACT inhaler Inhale 2 puffs into the lungs every 6 (six) hours as needed for wheezing or shortness of breath. 18 Inhaler 0  . albuterol (PROVENTIL) (2.5 MG/3ML) 0.083% nebulizer solution Take 3 mLs (2.5 mg total) every 6 (six) hours as needed by nebulization for wheezing or shortness of breath. 150 mL 1  . amLODipine (NORVASC) 5 MG tablet Take 1 tablet (5 mg total) by mouth daily. 90 tablet 0  . aspirin 81 MG EC tablet Take  1 tablet (81 mg total) by mouth daily. Swallow whole. 90 tablet 0  . atorvastatin (LIPITOR) 80 MG tablet Take 1 tablet (80 mg total) by mouth daily. 60 tablet 3  . BREO ELLIPTA 100-25 MCG/INH AEPB INHALE 1 PUFF EVERY DAY 60 each 2  . colchicine 0.6 MG tablet Take two pills po day 1 and then one pill po bid until symptoms resolve (do not take longer than 3 days) 8 tablet 1  . Diclofenac Sodium 3 % GEL Use on area of the pain 100 g 0  . escitalopram (LEXAPRO) 20 MG tablet Take 1 tablet (20 mg total) by mouth daily. 90 tablet 0  . ferrous sulfate 325 (65 FE) MG tablet Take 1 tablet (325 mg total) by mouth daily. 30 tablet 2  . flunisolide (NASALIDE) 25 MCG/ACT (0.025%) SOLN Place 2 sprays into the nose daily. 1 Bottle 2  . gabapentin (NEURONTIN) 600 MG tablet Take 1 tablet (600 mg  total) by mouth 3 (three) times daily. 270 tablet 0  . guaiFENesin (MUCINEX) 600 MG 12 hr tablet Take 1 tablet (600 mg total) by mouth 2 (two) times daily. 30 tablet 0  . meloxicam (MOBIC) 7.5 MG tablet Take 1 tablet (7.5 mg total) by mouth daily. 30 tablet 0  . metFORMIN (GLUCOPHAGE-XR) 500 MG 24 hr tablet Take 1 tablet (500 mg total) by mouth 2 (two) times daily. 180 tablet 0  . methylPREDNISolone (MEDROL DOSEPAK) 4 MG TBPK tablet Take as directed 21 tablet 0  . metolazone (ZAROXOLYN) 2.5 MG tablet Take 1 tablet (2.5 mg total) by mouth every Monday, Wednesday, and Friday. Take 30 mins prior to taking Torsemide 45 tablet 3  . mirtazapine (REMERON SOL-TAB) 45 MG disintegrating tablet Take 1 tablet (45 mg total) by mouth at bedtime. 90 tablet 0  . montelukast (SINGULAIR) 10 MG tablet Take 1 tablet (10 mg total) by mouth at bedtime. 90 tablet 1  . Multiple Vitamin (MULTIVITAMIN WITH MINERALS) TABS tablet Take 1 tablet by mouth daily.    [name] omeprazole (PRILOSEC) 40 MG capsule Take 1 capsule (40 mg total) by mouth 2 (two) times daily. in the morning and evening 56 capsule 2  . OXYGEN Inhale 6 L into the lungs daily.    . potassium chloride (K-DUR) 10 MEQ tablet Take 6 tablets (60 mEq total) by mouth every morning AND 6 tablets (60 mEq total) every evening. May also take 4 tablets (40 mEq total) at bedtime as needed ([place] Metolazone on M/W/F). 450 tablet 6  . tiotropium (SPIRIVA HANDIHALER) 18 MCG inhalation capsule Place 1 capsule (18 mcg total) into inhaler and inhale daily. 90 capsule 1  . torsemide (DEMADEX) 20 MG tablet Take 4 tablets (80 mg total) by mouth 2 (two) times daily. 240 tablet 6  . ziprasidone (GEODON) 60 MG capsule Take 1 capsule (60 mg total) by mouth 2 (two) times daily with a meal. 90 capsule 0   No current facility-administered medications for this encounter.    Allergies  Allergen Reactions  . Hydrocodone-Acetaminophen Shortness Of Breath  . Hydroxyzine Anaphylaxis and Shortness  Of Breath  . Latuda [Lurasidone Hcl] Anaphylaxis  . Codeine Nausea And Vomiting  . Sulfa Antibiotics Itching   Social History   Socioeconomic History  . Marital status: Widowed    Spouse name: Not on file  . Number of children: 3  . Years of education: Not on file  . Highest education level: Not on file  Social Needs  . Financial resource strain: Not  on file  . Food insecurity - worry: Not on file  . Food insecurity - inability: Not on file  . Transportation needs - medical: Not on file  . Transportation needs - non-medical: Not on file  Occupational History  . Occupation: disabled    Comment: factory production  Tobacco Use  . Smoking status: Former Smoker    Packs/day: 1.50    Years: 38.00    Pack years: 57.00    Types: Cigarettes    Start date: 03/13/1977    Last attempt to quit: 04/10/2016    Years since quitting: 0.9  . Smokeless tobacco: Never Used  Substance and Sexual Activity  . Alcohol use: No    Alcohol/week: 0.0 oz  . Drug use: No    Comment: 04/10/2016 "last used cocaine back in November 2017"  . Sexual activity: No    Birth control/protection: Surgical  Other Topics Concern  . Not on file  Social History Narrative   Has 1 son, [name]   Lives with son and his boyfriend   Her house has ramps and handrails should she ever needs them.    Her mother lives down the street from her and is a good support person in addition to her son.   She drives herself, has private transportation.    Cocaine free since 02/24/16, smoke free since 04/10/16   Family History  Problem Relation Age of Onset  . Cancer Father        prostate  . Cancer Mother        lung  . Depression Mother   . Depression Sister   . Anxiety disorder Sister   . Schizophrenia Sister   . Bipolar disorder Sister   . Depression Sister   . Depression Brother   . Heart failure Unknown        cousin   Vitals:   03/08/17 1528  BP: 116/72  Pulse: 79  SpO2: 96%  Weight: 295 lb (133.8 kg)   Wt  Readings from Last 3 Encounters:  03/08/17 295 lb (133.8 kg)  02/26/17 294 lb (133.4 kg)  02/23/17 (!) 300 lb 9.6 oz (136.4 kg)    PHYSICAL EXAM: General: Obese. NAD. Arrived in [facility]. O2 via [place].  HEENT: Normal Neck: Supple. JVP difficult due to body habitus.  Cor: PMI nondisplaced. RRR, No M/G/R noted Lungs: Distant. No wheezing.  Abdomen: Obese, soft, non-tender, non-distended, no HSM. No bruits or masses. +BS  Extremities: No cyanosis, clubbing, or rash. R and LLE no edema.  Neuro: Alert & orientedx3, cranial nerves grossly intact. moves all 4 extremities w/o difficulty. Affect pleasant    ASSESSMENT & PLAN:  1. Chronic diastolic CHF - Echo 50/9326 LVEF 60-65% - Volume status difficult but appears stable.  - Continue torsemide 80 mg BID - Continue metolazone 2.5 mg M-W-F. Have previously encouraged patient to limit to twice weekly, but she refuses.  - Reinforced fluid restriction to < 2 L daily, sodium restriction to less than 2000 mg daily, and the importance of daily weights.    2. HTN - Stable on current meds.  - Continue amlodipine 5 mg daily.  3. COPD on chronic home O2 - O2 demand stable per PCCM.  Follows with Dr. Lamonte Sakai.  - Continue chronic O2  4. Morbid obesity - Stressed importance of weight loss, including increasing activity and portion control. No change.   5. CKD stage III - BMET today.  - Saw renal and was told they were "not the type of  kidney doctor she needed". Will request records.   6. DM2 - Per PCP.   7. R carotid bruit - 1-39% bilateral by dopplers 11/2016.  - Per. [name]. No change.   Do not think we have very much to offer Miss Grieder in the HF clinic as volume seems well maintained.  Will schedule to see MD in 2-3 months. Can consider RHC, but echoes have not shown evidence of PAH. After MD visit, may be appropriate to just continue following at [facility]. Pt knows to call back sooner with any change in symptoms.   Shirley Friar,  PA-C 03/08/17   Greater than 50% of the 25 minute visit was spent in counseling/coordination of care regarding disease state education, salt/fluid restriction, sliding scale diuretics, review of labwork and previous procedures including echo,  and medication compliance.

## 2017-03-09 ENCOUNTER — Telehealth (HOSPITAL_COMMUNITY): Payer: Self-pay | Admitting: Cardiology

## 2017-03-09 DIAGNOSIS — I5032 Chronic diastolic (congestive) heart failure: Secondary | ICD-10-CM

## 2017-03-09 NOTE — Telephone Encounter (Signed)
Patient aware. Patient reports she was NOT taking extra tabs on metolazone days. Advised to take 40 meq now as she has already taken 60 this AM and will take 60 this PM. Reports understanding. Repeat labs 3/1

## 2017-03-09 NOTE — Telephone Encounter (Signed)
-----   Message from Shirley Friar, PA-C sent at 03/09/2017 10:15 AM EST ----- Please clarify how she is taking K.  She needs to take extra 34meq today, and make sure she is taking 40 meq extra EVERY time she takes metolazone.  Needs repeat 7 days

## 2017-03-12 ENCOUNTER — Ambulatory Visit (INDEPENDENT_AMBULATORY_CARE_PROVIDER_SITE_OTHER): Payer: Medicare HMO | Admitting: Internal Medicine

## 2017-03-12 ENCOUNTER — Ambulatory Visit (INDEPENDENT_AMBULATORY_CARE_PROVIDER_SITE_OTHER): Payer: Medicare HMO | Admitting: Family Medicine

## 2017-03-12 ENCOUNTER — Encounter: Payer: Self-pay | Admitting: Family Medicine

## 2017-03-12 VITALS — BP 101/65 | HR 101 | Temp 98.5°F | Ht 64.0 in | Wt 294.4 lb

## 2017-03-12 DIAGNOSIS — M6281 Muscle weakness (generalized): Secondary | ICD-10-CM | POA: Diagnosis not present

## 2017-03-12 DIAGNOSIS — Z0000 Encounter for general adult medical examination without abnormal findings: Secondary | ICD-10-CM

## 2017-03-12 DIAGNOSIS — Z515 Encounter for palliative care: Secondary | ICD-10-CM | POA: Insufficient documentation

## 2017-03-12 DIAGNOSIS — I10 Essential (primary) hypertension: Secondary | ICD-10-CM | POA: Diagnosis not present

## 2017-03-12 DIAGNOSIS — J189 Pneumonia, unspecified organism: Secondary | ICD-10-CM | POA: Diagnosis not present

## 2017-03-12 DIAGNOSIS — J440 Chronic obstructive pulmonary disease with acute lower respiratory infection: Secondary | ICD-10-CM | POA: Diagnosis not present

## 2017-03-12 DIAGNOSIS — I509 Heart failure, unspecified: Secondary | ICD-10-CM | POA: Diagnosis not present

## 2017-03-12 DIAGNOSIS — J449 Chronic obstructive pulmonary disease, unspecified: Secondary | ICD-10-CM | POA: Diagnosis not present

## 2017-03-12 DIAGNOSIS — E118 Type 2 diabetes mellitus with unspecified complications: Secondary | ICD-10-CM | POA: Diagnosis not present

## 2017-03-12 DIAGNOSIS — J45909 Unspecified asthma, uncomplicated: Secondary | ICD-10-CM | POA: Diagnosis not present

## 2017-03-12 DIAGNOSIS — R0602 Shortness of breath: Secondary | ICD-10-CM | POA: Diagnosis not present

## 2017-03-12 DIAGNOSIS — R062 Wheezing: Secondary | ICD-10-CM | POA: Diagnosis not present

## 2017-03-12 DIAGNOSIS — G4733 Obstructive sleep apnea (adult) (pediatric): Secondary | ICD-10-CM | POA: Diagnosis not present

## 2017-03-12 DIAGNOSIS — M1990 Unspecified osteoarthritis, unspecified site: Secondary | ICD-10-CM | POA: Diagnosis not present

## 2017-03-12 DIAGNOSIS — J9620 Acute and chronic respiratory failure, unspecified whether with hypoxia or hypercapnia: Secondary | ICD-10-CM | POA: Diagnosis not present

## 2017-03-12 MED ORDER — MELOXICAM 7.5 MG PO TABS: 7.5000 mg | ORAL_TABLET | Freq: Every day | ORAL | 0 refills | Status: DC

## 2017-03-12 NOTE — Progress Notes (Signed)
Medical Nutrition Therapy:  Appt start time: 1500 end time:  1600. PCP: [name], MD Therapist: Francee Piccolo, [facility]   Assessment:  Primary concerns today: Weight management and Blood sugar control.  Ms. Fontanella PT ended last week.  She has so far continued to do her exercises at home about twice a week.  She will be getting an electronic wheelchair, b/c of breathing difficulties with exertion.  We discussed the need to continue to exercise as she is able even after she gets a wheelchair.    Ms. Lyn is not checking BG at home, but A1C in December was 6.0.  [name] still drinking soda regularly; did not remember why I recommended limiting soda at last visit.  24-hr recall also suggests she is not limiting Na intake (deli [place] on sandwich and pepperoni pizza).  She insisted that she likes very few vegetables, but was surprised when we came up with a pretty extensive list of ones she does like.  Her son's friend is no longer cooking for her (unable to b/c of schedule), but her mom still sometimes brings her home-cooked food, and her friend Ashby Dawes said she might start doing some cooking for her.  I encouraged Ms. Tancredi to share today's AVS with these individuals so they will understand her dietary needs.    24-hr recall:  (Got up at 1 PM) B ( AM)-  --- Snk ( AM)-  --- L (1 PM)-  1 apple butter (3 tbsp) sandwich, 12 oz Pepsi Snk (2 PM)-  1 apple butter (3 tbsp) sandwich, 12 oz Pepsi D (5 PM)-  1 [place] sandwich w/ let, tom, lite mayo, 12 oz Pepsi Snk (7 PM)-  2 slc pepperoni pizza, water Snk (10 PM)-  1 slc pepperoni pizza, water To sleep ~11:30; awoke ~ 1 AM, & stayed awake till 3:30 or 4 PM.   Typical day? Yes.      Progress Towards Goal(s):  In progress.   Nutritional Diagnosis:  NB-1.1 Food and nutrition-related knowledge deficit As related to weight management and BG control.  As evidenced by patient acknowledgement.    Intervention:  Nutrition  education.  Handouts given during visit include:  AVS (large print)  Demonstrated degree of understanding via:  Teach Back  Barriers to learning/adherence to lifestyle change: Knowledge deficit as well as disinterest in food preparation and food insecurity.   Monitoring/Evaluation:  Dietary intake, exercise, FBG, and body weight in 4 week(s).

## 2017-03-12 NOTE — Patient Instructions (Addendum)
I want you to get mammogram. I am going to place and order for this. Please follow up in about 1 month

## 2017-03-12 NOTE — Progress Notes (Signed)
   [name] [name], MD Phone: [phone]  Reason For Visit: Follow up   # CHRONIC DM, Type 2: Reports no concerns Meds: metformin 500 mg 24 hr, gabapentin as needed for diabetic neuropathy  Reports good compliance. Tolerating well w/o side-effects Currently on ACEi / ARB  Denies polyuria, visual changes, numbness or tingling.  # Health Maintenance -It is not interested in getting a colonoscopy.  She would agree to fecal occult blood test.   CHRONIC HTN: Reports no concerns  Current Meds - Norvasc, metolazone and demadex  Reports good compliance, took meds today. Tolerating well, w/o complaints. Denies CP, dyspnea, HA, edema, dizziness / lightheadedness  Past Medical History Reviewed problem list.  Medications- reviewed and updated No additions to family history Social history- patient is a non- smoker  Objective: BP 101/65 (BP Location: Left Wrist, Patient Position: Sitting, Cuff Size: Normal)   Pulse (!) 101   Temp 98.5 F (36.9 C) (Oral)   Ht 5\' 4"  (1.626 m)   Wt 294 lb 6.4 oz (133.5 kg)   SpO2 96%   BMI 50.53 kg/m  Gen: NAD, alert, cooperative with exam Cardio: regular rate and rhythm, S1S2 heard, no murmurs appreciated Pulm: clear to auscultation bilaterally, no wheezes, rhonchi or rales Extremities: warm, well perfused, No edema today ;    Diabetic Foot Exam - Simple   Simple Foot Form Diabetic Foot exam was performed with the following findings:  Yes 03/12/2017  1:50 PM  Visual Inspection No deformities, no ulcerations, no other skin breakdown bilaterally:  Yes Sensation Testing Intact to touch and monofilament testing bilaterally:  Yes Pulse Check Posterior Tibialis and Dorsalis pulse intact bilaterally:  Yes Comments     Assessment/Plan: See problem based a/p  Type 2 diabetes mellitus (HCC) Continue metformin 500 mg 24 hour  Obtain A1C  Foot exam completed  Discussed patient getting any eye exam    Health care  maintenance Patient will to get an annual FOBT test  Will place order for screening mammogram   Essential hypertension Norvasc, metolazone and demadex  Patient would likely benefit from an ACE given cardiac and diabetes hx. However will hold off on making any changes as patient has had several CHF exacerbation in the past   Arthritis Continues to have diffuse joint pains Was prescribed colchine and prednisone but did not want to take these as they made her nauseated Continue mobic 7.5 mg once daily now, along with tylenol arthritis  Again discussed the affects on this on the kidney's however patient feels benefits outweigh the effects

## 2017-03-12 NOTE — Patient Instructions (Addendum)
1. Get at least two real meals a day.  One can be just a sandwich with some vegetables on the side, like  cucumbers, cherry tomatoes.  A REAL meal includes a protein food, starchy food, and a vegetable.   - Vegetables you currently like and eat:  Broccoli, okra, green beans, cooked spinach, collards, turnip greens, bell peppers, tomatoes, cabbage (cooked or coleslaw).   - The more vegetables you eat, the more you will come to like vegetables, including those you aren't crazy about right now.  Humans tend to prefer the foods we get most used to eating!  (Try a new food at least 12 times before you decide you don't like it.)    - A good way to learn to like new vegetables is in SOUP.  You can even use canned soup, but you need to look for a sodium-reduced or low-sodium soup.  Even better, ask your friend Ashby Dawes to cook some homemade soup for you (with no added salt).     2. Do your physical therapy rehab exercises at home at least days a week.    - Space your exercises out throughout the day to make it easier for you, starting your first set of exercises right before breakfast.     - Plan to do some of your exercises during the ads on TV.    3. Your cardiology recommendations:    - Limit fluids to no more than 2 liters per day, which is about eight 8-ounce cups.  For example, each 12-ounce soda you drink is equal to 1  cups.    - Keep your sodium intake low.  This means to limit highly processed foods like processed meats, frozen dinners, canned foods, and most restaurant and take-out foods.    - Check your weight every day when you get up, and record this number.  Bring your weight record to your doctor's appointments.    Your next Nutrition appt is on Monday, Mar 25 at 2:30.

## 2017-03-13 ENCOUNTER — Ambulatory Visit (HOSPITAL_COMMUNITY): Payer: Self-pay | Admitting: Psychiatry

## 2017-03-13 ENCOUNTER — Ambulatory Visit (INDEPENDENT_AMBULATORY_CARE_PROVIDER_SITE_OTHER): Payer: Medicare HMO | Admitting: Licensed Clinical Social Worker

## 2017-03-13 ENCOUNTER — Encounter: Payer: Self-pay | Admitting: Internal Medicine

## 2017-03-13 DIAGNOSIS — F3341 Major depressive disorder, recurrent, in partial remission: Secondary | ICD-10-CM

## 2017-03-13 LAB — BASIC METABOLIC PANEL
BUN/Creatinine Ratio: 14 (ref 9–23)
BUN: 26 mg/dL — ABNORMAL HIGH (ref 6–24)
CO2: 35 mmol/L — ABNORMAL HIGH (ref 20–29)
Calcium: 10.1 mg/dL (ref 8.7–10.2)
Chloride: 89 mmol/L — ABNORMAL LOW (ref 96–106)
Creatinine, Ser: 1.8 mg/dL — ABNORMAL HIGH (ref 0.57–1.00)
GFR calc Af Amer: 36 mL/min/{1.73_m2} — ABNORMAL LOW (ref >59)
GFR calc non Af Amer: 31 mL/min/{1.73_m2} — ABNORMAL LOW (ref >59)
Glucose: 90 mg/dL (ref 65–99)
Potassium: 3.4 mmol/L — ABNORMAL LOW (ref 3.5–5.2)
Sodium: 143 mmol/L (ref 134–144)

## 2017-03-13 LAB — MICROALBUMIN / CREATININE URINE RATIO
Creatinine, Urine: 84.4 mg/dL
Microalb/Creat Ratio: 47.7 mg/g creat — ABNORMAL HIGH (ref 0.0–30.0)
Microalbumin, Urine: 40.3 ug/mL

## 2017-03-13 NOTE — Assessment & Plan Note (Signed)
Norvasc, metolazone and demadex  Patient would likely benefit from an ACE given cardiac and diabetes hx. However will hold off on making any changes as patient has had several CHF exacerbation in the past

## 2017-03-13 NOTE — Assessment & Plan Note (Signed)
Continue metformin 500 mg 24 hour  Obtain A1C  Foot exam completed  Discussed patient getting any eye exam

## 2017-03-13 NOTE — Assessment & Plan Note (Signed)
Continues to have diffuse joint pains Was prescribed colchine and prednisone but did not want to take these as they made her nauseated Continue mobic 7.5 mg once daily now, along with tylenol arthritis  Again discussed the affects on this on the kidney's however patient feels benefits outweigh the effects

## 2017-03-13 NOTE — Assessment & Plan Note (Signed)
Patient will to get an annual FOBT test  Will place order for screening mammogram

## 2017-03-14 DIAGNOSIS — J9611 Chronic respiratory failure with hypoxia: Secondary | ICD-10-CM | POA: Diagnosis not present

## 2017-03-15 ENCOUNTER — Ambulatory Visit: Payer: Self-pay | Admitting: Emergency Medicine

## 2017-03-15 DIAGNOSIS — J9611 Chronic respiratory failure with hypoxia: Secondary | ICD-10-CM | POA: Diagnosis not present

## 2017-03-16 ENCOUNTER — Other Ambulatory Visit (HOSPITAL_COMMUNITY): Payer: Self-pay

## 2017-03-16 DIAGNOSIS — J9611 Chronic respiratory failure with hypoxia: Secondary | ICD-10-CM | POA: Diagnosis not present

## 2017-03-17 DIAGNOSIS — J9611 Chronic respiratory failure with hypoxia: Secondary | ICD-10-CM | POA: Diagnosis not present

## 2017-03-18 DIAGNOSIS — J9611 Chronic respiratory failure with hypoxia: Secondary | ICD-10-CM | POA: Diagnosis not present

## 2017-03-19 DIAGNOSIS — R69 Illness, unspecified: Secondary | ICD-10-CM | POA: Diagnosis not present

## 2017-03-19 DIAGNOSIS — J9611 Chronic respiratory failure with hypoxia: Secondary | ICD-10-CM | POA: Diagnosis not present

## 2017-03-20 DIAGNOSIS — F411 Generalized anxiety disorder: Secondary | ICD-10-CM | POA: Diagnosis not present

## 2017-03-20 DIAGNOSIS — F331 Major depressive disorder, recurrent, moderate: Secondary | ICD-10-CM | POA: Diagnosis not present

## 2017-03-20 DIAGNOSIS — J9611 Chronic respiratory failure with hypoxia: Secondary | ICD-10-CM | POA: Diagnosis not present

## 2017-03-21 DIAGNOSIS — R0602 Shortness of breath: Secondary | ICD-10-CM | POA: Diagnosis not present

## 2017-03-21 DIAGNOSIS — G4733 Obstructive sleep apnea (adult) (pediatric): Secondary | ICD-10-CM | POA: Diagnosis not present

## 2017-03-21 DIAGNOSIS — J189 Pneumonia, unspecified organism: Secondary | ICD-10-CM | POA: Diagnosis not present

## 2017-03-21 DIAGNOSIS — J45909 Unspecified asthma, uncomplicated: Secondary | ICD-10-CM | POA: Diagnosis not present

## 2017-03-21 DIAGNOSIS — J449 Chronic obstructive pulmonary disease, unspecified: Secondary | ICD-10-CM | POA: Diagnosis not present

## 2017-03-21 DIAGNOSIS — J440 Chronic obstructive pulmonary disease with acute lower respiratory infection: Secondary | ICD-10-CM | POA: Diagnosis not present

## 2017-03-21 DIAGNOSIS — I509 Heart failure, unspecified: Secondary | ICD-10-CM | POA: Diagnosis not present

## 2017-03-21 DIAGNOSIS — R062 Wheezing: Secondary | ICD-10-CM | POA: Diagnosis not present

## 2017-03-21 DIAGNOSIS — J9611 Chronic respiratory failure with hypoxia: Secondary | ICD-10-CM | POA: Diagnosis not present

## 2017-03-21 DIAGNOSIS — M6281 Muscle weakness (generalized): Secondary | ICD-10-CM | POA: Diagnosis not present

## 2017-03-22 DIAGNOSIS — J9611 Chronic respiratory failure with hypoxia: Secondary | ICD-10-CM | POA: Diagnosis not present

## 2017-03-23 DIAGNOSIS — J9611 Chronic respiratory failure with hypoxia: Secondary | ICD-10-CM | POA: Diagnosis not present

## 2017-03-24 DIAGNOSIS — R062 Wheezing: Secondary | ICD-10-CM | POA: Diagnosis not present

## 2017-03-24 DIAGNOSIS — G4733 Obstructive sleep apnea (adult) (pediatric): Secondary | ICD-10-CM | POA: Diagnosis not present

## 2017-03-24 DIAGNOSIS — J189 Pneumonia, unspecified organism: Secondary | ICD-10-CM | POA: Diagnosis not present

## 2017-03-24 DIAGNOSIS — R0602 Shortness of breath: Secondary | ICD-10-CM | POA: Diagnosis not present

## 2017-03-24 DIAGNOSIS — J440 Chronic obstructive pulmonary disease with acute lower respiratory infection: Secondary | ICD-10-CM | POA: Diagnosis not present

## 2017-03-24 DIAGNOSIS — J9611 Chronic respiratory failure with hypoxia: Secondary | ICD-10-CM | POA: Diagnosis not present

## 2017-03-24 DIAGNOSIS — J45909 Unspecified asthma, uncomplicated: Secondary | ICD-10-CM | POA: Diagnosis not present

## 2017-03-25 DIAGNOSIS — J9611 Chronic respiratory failure with hypoxia: Secondary | ICD-10-CM | POA: Diagnosis not present

## 2017-03-26 DIAGNOSIS — J9611 Chronic respiratory failure with hypoxia: Secondary | ICD-10-CM | POA: Diagnosis not present

## 2017-03-27 ENCOUNTER — Other Ambulatory Visit: Payer: Self-pay | Admitting: Internal Medicine

## 2017-03-27 ENCOUNTER — Other Ambulatory Visit (HOSPITAL_COMMUNITY): Payer: Self-pay | Admitting: Psychiatry

## 2017-03-27 DIAGNOSIS — J9611 Chronic respiratory failure with hypoxia: Secondary | ICD-10-CM | POA: Diagnosis not present

## 2017-03-27 DIAGNOSIS — F200 Paranoid schizophrenia: Secondary | ICD-10-CM

## 2017-03-28 ENCOUNTER — Other Ambulatory Visit: Payer: Self-pay | Admitting: *Deleted

## 2017-03-28 ENCOUNTER — Ambulatory Visit (HOSPITAL_COMMUNITY)
Admission: RE | Admit: 2017-03-28 | Discharge: 2017-03-28 | Disposition: A | Discharge status: Home / Self Care | Payer: Medicare HMO | Source: Ambulatory Visit | Attending: Internal Medicine | Admitting: Internal Medicine

## 2017-03-28 DIAGNOSIS — J9611 Chronic respiratory failure with hypoxia: Secondary | ICD-10-CM | POA: Diagnosis not present

## 2017-03-28 DIAGNOSIS — I5032 Chronic diastolic (congestive) heart failure: Secondary | ICD-10-CM | POA: Insufficient documentation

## 2017-03-28 DIAGNOSIS — R0981 Nasal congestion: Secondary | ICD-10-CM

## 2017-03-28 LAB — BASIC METABOLIC PANEL
Anion gap: 12 (ref 5–15)
BUN: 23 mg/dL — ABNORMAL HIGH (ref 6–20)
CO2: 27 mmol/L (ref 22–32)
Calcium: 9.4 mg/dL (ref 8.9–10.3)
Chloride: 101 mmol/L (ref 101–111)
Creatinine, Ser: 1.63 mg/dL — ABNORMAL HIGH (ref 0.44–1.00)
GFR calc Af Amer: 39 mL/min — ABNORMAL LOW (ref >60)
GFR calc non Af Amer: 34 mL/min — ABNORMAL LOW (ref >60)
Glucose, Bld: 94 mg/dL (ref 65–99)
Potassium: 3.3 mmol/L — ABNORMAL LOW (ref 3.5–5.1)
Sodium: 140 mmol/L (ref 135–145)

## 2017-03-29 DIAGNOSIS — J9611 Chronic respiratory failure with hypoxia: Secondary | ICD-10-CM | POA: Diagnosis not present

## 2017-03-29 MED ORDER — FLUNISOLIDE 25 MCG/ACT (0.025%) NA SOLN: 2.0000 | Freq: Every day | NASAL | 2 refills | Status: DC

## 2017-03-30 ENCOUNTER — Telehealth (HOSPITAL_COMMUNITY): Payer: Self-pay

## 2017-03-30 DIAGNOSIS — I5032 Chronic diastolic (congestive) heart failure: Secondary | ICD-10-CM

## 2017-03-30 DIAGNOSIS — J9611 Chronic respiratory failure with hypoxia: Secondary | ICD-10-CM | POA: Diagnosis not present

## 2017-03-30 DIAGNOSIS — R0602 Shortness of breath: Secondary | ICD-10-CM | POA: Diagnosis not present

## 2017-03-30 MED ORDER — POTASSIUM CHLORIDE ER 10 MEQ PO TBCR: EXTENDED_RELEASE_TABLET | ORAL | 6 refills | Status: DC

## 2017-03-30 NOTE — Telephone Encounter (Signed)
Result Notes for Basic metabolic panel   Notes recorded by Effie Berkshire, RN on 03/30/2017 at 1:37 PM EDT Patient aware and agreeable to Rx change. Will call us back to schedule her lab appt as she is not at her calender at this time. Rx updated to preferred pharmacy electronically. ------  Notes recorded by Shirley Friar, PA-C on 03/30/2017 at 10:42 AM EDT Please call today.    Legrand Como [address] [place], PA-C 03/30/2017 10:42 AM ------  Notes recorded by Shirley Friar, PA-C on 03/28/2017 at 2:13 PM EDT Please change K to 40 meq TID in attempt to improved absorption.  Recheck 7-10 days.    Legrand Como [address]" [place], PA-C 03/28/2017 2:13 PM

## 2017-03-31 DIAGNOSIS — J9611 Chronic respiratory failure with hypoxia: Secondary | ICD-10-CM | POA: Diagnosis not present

## 2017-04-01 DIAGNOSIS — J45909 Unspecified asthma, uncomplicated: Secondary | ICD-10-CM | POA: Diagnosis not present

## 2017-04-01 DIAGNOSIS — J449 Chronic obstructive pulmonary disease, unspecified: Secondary | ICD-10-CM | POA: Diagnosis not present

## 2017-04-01 DIAGNOSIS — R062 Wheezing: Secondary | ICD-10-CM | POA: Diagnosis not present

## 2017-04-01 DIAGNOSIS — M6281 Muscle weakness (generalized): Secondary | ICD-10-CM | POA: Diagnosis not present

## 2017-04-01 DIAGNOSIS — G4733 Obstructive sleep apnea (adult) (pediatric): Secondary | ICD-10-CM | POA: Diagnosis not present

## 2017-04-01 DIAGNOSIS — J9611 Chronic respiratory failure with hypoxia: Secondary | ICD-10-CM | POA: Diagnosis not present

## 2017-04-01 DIAGNOSIS — R0602 Shortness of breath: Secondary | ICD-10-CM | POA: Diagnosis not present

## 2017-04-01 DIAGNOSIS — I509 Heart failure, unspecified: Secondary | ICD-10-CM | POA: Diagnosis not present

## 2017-04-01 DIAGNOSIS — J189 Pneumonia, unspecified organism: Secondary | ICD-10-CM | POA: Diagnosis not present

## 2017-04-01 DIAGNOSIS — J440 Chronic obstructive pulmonary disease with acute lower respiratory infection: Secondary | ICD-10-CM | POA: Diagnosis not present

## 2017-04-02 DIAGNOSIS — J9611 Chronic respiratory failure with hypoxia: Secondary | ICD-10-CM | POA: Diagnosis not present

## 2017-04-02 DIAGNOSIS — M1A09X0 Idiopathic chronic gout, multiple sites, without tophus (tophi): Secondary | ICD-10-CM | POA: Diagnosis not present

## 2017-04-02 DIAGNOSIS — N183 Chronic kidney disease, stage 3 (moderate): Secondary | ICD-10-CM | POA: Diagnosis not present

## 2017-04-02 DIAGNOSIS — I5032 Chronic diastolic (congestive) heart failure: Secondary | ICD-10-CM

## 2017-04-02 DIAGNOSIS — M2550 Pain in unspecified joint: Secondary | ICD-10-CM | POA: Diagnosis not present

## 2017-04-03 DIAGNOSIS — J9611 Chronic respiratory failure with hypoxia: Secondary | ICD-10-CM | POA: Diagnosis not present

## 2017-04-04 ENCOUNTER — Telehealth: Payer: Self-pay | Admitting: Adult Health

## 2017-04-04 ENCOUNTER — Telehealth: Payer: Self-pay | Admitting: Emergency Medicine

## 2017-04-04 DIAGNOSIS — J9611 Chronic respiratory failure with hypoxia: Secondary | ICD-10-CM | POA: Diagnosis not present

## 2017-04-04 NOTE — Telephone Encounter (Signed)
Called and spoke to the patient let them know [place] will call her I have talked to [place] and will speak to [facility]

## 2017-04-04 NOTE — Telephone Encounter (Signed)
We can do it on 04/16/17

## 2017-04-04 NOTE — Telephone Encounter (Signed)
Will route to [facility] per protocol  Order sent on 02/26/17

## 2017-04-04 NOTE — Telephone Encounter (Signed)
Spoke with Tiffany [name] at [facility], states that pt needs a face to face visit for a power wheelchair to be given to her as prescribed.   Face to face visit for power wheelchair includes several items needing to be addressed and documented in pt's OV note, including why pt cannot use lesser forms of ambulation assistance.  Tiffany [name] has particular paperwork that also needs to be filled out- will fax form once we determine when this face-to-face visit will be.    Tiffany [name] wants to know if this can be addressed on 4/1 ov with RB, or if this needs to be scheduled for another visit.    RB please advise.  Thanks.

## 2017-04-04 NOTE — Telephone Encounter (Signed)
Tiffany [name] said it was 04/30/17 the appt. Is 04/16/17 at 4:15pm

## 2017-04-05 ENCOUNTER — Other Ambulatory Visit (HOSPITAL_COMMUNITY): Payer: Self-pay | Admitting: Psychiatry

## 2017-04-05 DIAGNOSIS — J9611 Chronic respiratory failure with hypoxia: Secondary | ICD-10-CM | POA: Diagnosis not present

## 2017-04-05 NOTE — Telephone Encounter (Signed)
Patient was given 90-day supply on February 1 for 3 months.  Too soon to get refill.  No early refills.

## 2017-04-05 NOTE — Telephone Encounter (Signed)
Called and spoke with Jackelyn Poling at [facility] to let her know he will address everything needed in order for pt to receive the power wheelchair at pt's OV 04/16/17.  Debbie expressed understanding and stated she was going to go ahead and send paperwork over to our office so he would have it when pt comes in for the [place].  Will keep a lookout for the paperwork.  Going to route this message to myself as an FYI so I can be on the lookout for the paperwork from [place].

## 2017-04-06 DIAGNOSIS — F411 Generalized anxiety disorder: Secondary | ICD-10-CM | POA: Diagnosis not present

## 2017-04-06 DIAGNOSIS — J9611 Chronic respiratory failure with hypoxia: Secondary | ICD-10-CM | POA: Diagnosis not present

## 2017-04-06 DIAGNOSIS — F5081 Binge eating disorder: Secondary | ICD-10-CM | POA: Diagnosis not present

## 2017-04-06 DIAGNOSIS — F331 Major depressive disorder, recurrent, moderate: Secondary | ICD-10-CM | POA: Diagnosis not present

## 2017-04-07 DIAGNOSIS — J9611 Chronic respiratory failure with hypoxia: Secondary | ICD-10-CM | POA: Diagnosis not present

## 2017-04-08 DIAGNOSIS — J9611 Chronic respiratory failure with hypoxia: Secondary | ICD-10-CM | POA: Diagnosis not present

## 2017-04-09 ENCOUNTER — Ambulatory Visit (INDEPENDENT_AMBULATORY_CARE_PROVIDER_SITE_OTHER): Payer: Medicare HMO | Admitting: Family Medicine

## 2017-04-09 ENCOUNTER — Encounter: Payer: Self-pay | Admitting: Family Medicine

## 2017-04-09 DIAGNOSIS — F331 Major depressive disorder, recurrent, moderate: Secondary | ICD-10-CM | POA: Diagnosis not present

## 2017-04-09 DIAGNOSIS — J189 Pneumonia, unspecified organism: Secondary | ICD-10-CM | POA: Diagnosis not present

## 2017-04-09 DIAGNOSIS — J9620 Acute and chronic respiratory failure, unspecified whether with hypoxia or hypercapnia: Secondary | ICD-10-CM | POA: Diagnosis not present

## 2017-04-09 DIAGNOSIS — R062 Wheezing: Secondary | ICD-10-CM | POA: Diagnosis not present

## 2017-04-09 DIAGNOSIS — R0602 Shortness of breath: Secondary | ICD-10-CM | POA: Diagnosis not present

## 2017-04-09 DIAGNOSIS — E118 Type 2 diabetes mellitus with unspecified complications: Secondary | ICD-10-CM | POA: Diagnosis not present

## 2017-04-09 DIAGNOSIS — I509 Heart failure, unspecified: Secondary | ICD-10-CM | POA: Diagnosis not present

## 2017-04-09 DIAGNOSIS — J45909 Unspecified asthma, uncomplicated: Secondary | ICD-10-CM | POA: Diagnosis not present

## 2017-04-09 DIAGNOSIS — F5081 Binge eating disorder: Secondary | ICD-10-CM | POA: Diagnosis not present

## 2017-04-09 DIAGNOSIS — F411 Generalized anxiety disorder: Secondary | ICD-10-CM | POA: Diagnosis not present

## 2017-04-09 DIAGNOSIS — J440 Chronic obstructive pulmonary disease with acute lower respiratory infection: Secondary | ICD-10-CM | POA: Diagnosis not present

## 2017-04-09 DIAGNOSIS — M6281 Muscle weakness (generalized): Secondary | ICD-10-CM | POA: Diagnosis not present

## 2017-04-09 DIAGNOSIS — J449 Chronic obstructive pulmonary disease, unspecified: Secondary | ICD-10-CM | POA: Diagnosis not present

## 2017-04-09 DIAGNOSIS — G4733 Obstructive sleep apnea (adult) (pediatric): Secondary | ICD-10-CM | POA: Diagnosis not present

## 2017-04-09 DIAGNOSIS — J9611 Chronic respiratory failure with hypoxia: Secondary | ICD-10-CM | POA: Diagnosis not present

## 2017-04-09 NOTE — Patient Instructions (Addendum)
-   Bring Dr. Levada Dy your dosage of Vyvanse and the new gout medicine when you come tomorrow.   - On days when your gout is not hurting, do your physical therapy exercises as much as you can.  This will help you get or stay strong, and this translates into more independence.   - As you are able, replace some of your soda intake with water.  Remember that you want your total daily fluid intake to be between 1 1/2 and 2 liters, which equals 6-8 8-ounce cups per day (or 4 of your 16-ounce cups).    Instructions for your home health aide: 1. Avoid adding salt to foods you prepare.  This is important to help Tiffany [name]'s blood pressure and kidney function.   2. Include a vegetable for every lunch and dinner.   3 Make enough enough each day for two meals.    4. Prepare most foods by baking, roasting, boiling, sauteeing, or stir-frying, saving frying for only special occasions.  5. Make a list of groceries for [name] to buy each month, remembering the vegetables.  Give instructions to buy what is on sale in both fresh and frozen.

## 2017-04-09 NOTE — Progress Notes (Signed)
Medical Nutrition Therapy:  Appt start time: 1500 end time:  1600. PCP: [name], MD Therapist: Francee Piccolo, [place] (lives with pt)  Assessment:  Primary concerns today: Weight management and Blood sugar control.  Ms. Reyburn is not weighing herself at home; said it is just too much work.  She has not been doing her PT exercises.  She doesn't feel that the exercises will be helpful to her b/c they didn't help her lose weight.  She says the exercises did help with leg pain, however.  [name] having acute gout pain recently, which is also a significant deterrent to any exercise.  Meloxicam seemed to help with the pain, but her rheumatologist recommended discontinuing it b/c of it exacerbating gout and b/c of renal concerns.  Colchicine makes her throw up, so she cannot use that.  She was recently started on (she thinks) allopurinol from her rheumatologist last week.    Ms. Niese has an aide to prepare lunch and dinner, from 1 to 3:30 or 3:45 PM 7 days a week.  Her aide also helps with light housework.  Her aide has been making vegetables for meals on most days.  Lack of groceries is an obstacle to more frequent vegetable intake.  Ms. Menard said she does not qualify for food stamps.    FBG has been running between 90 and 100.  Still drinking soda usually with both lunch and dinner, she said she didn't know if she could cut down to once a day.    24-hr recall:  (Up at 12:30 PM) B ( AM)-  --- Snk ( AM)-  --- L (1:30 PM)-  1 c cooked oatmeal, 2 oz whole milk, 2 tbsp sugar, 12 oz soda Snk ( PM)-  --- D (8 PM)-  1 c shrimp-fried rice, 10 breaded fried scallops, 16 oz soda Snk ( PM)-  --- Typical day? No.  Seldom eats take-out food lately.      Progress Towards Goal(s):  In progress.   Nutritional Diagnosis:  NB-1.1 Food and nutrition-related knowledge deficit As related to weight management and BG control.  As evidenced by patient acknowledgement.     Intervention:  Nutrition education.  Handouts given during visit include:  AVS (large print)  Demonstrated degree of understanding via:  Teach Back  Barriers to learning/adherence to lifestyle change: Knowledge deficit as well as disinterest in food preparation and food insecurity.   Monitoring/Evaluation:  Dietary intake, exercise, FBG, and body weight in 4 week(s).

## 2017-04-10 ENCOUNTER — Other Ambulatory Visit: Payer: Self-pay

## 2017-04-10 ENCOUNTER — Ambulatory Visit (INDEPENDENT_AMBULATORY_CARE_PROVIDER_SITE_OTHER): Payer: Medicare HMO | Admitting: Internal Medicine

## 2017-04-10 ENCOUNTER — Encounter: Payer: Self-pay | Admitting: Internal Medicine

## 2017-04-10 VITALS — BP 122/76 | HR 95 | Temp 98.3°F | Wt 294.2 lb

## 2017-04-10 DIAGNOSIS — I129 Hypertensive chronic kidney disease with stage 1 through stage 4 chronic kidney disease, or unspecified chronic kidney disease: Secondary | ICD-10-CM | POA: Diagnosis not present

## 2017-04-10 DIAGNOSIS — D649 Anemia, unspecified: Secondary | ICD-10-CM | POA: Diagnosis not present

## 2017-04-10 DIAGNOSIS — I5032 Chronic diastolic (congestive) heart failure: Secondary | ICD-10-CM

## 2017-04-10 DIAGNOSIS — I10 Essential (primary) hypertension: Secondary | ICD-10-CM | POA: Diagnosis not present

## 2017-04-10 DIAGNOSIS — M109 Gout, unspecified: Secondary | ICD-10-CM

## 2017-04-10 DIAGNOSIS — N183 Chronic kidney disease, stage 3 (moderate): Secondary | ICD-10-CM | POA: Diagnosis not present

## 2017-04-10 DIAGNOSIS — E1122 Type 2 diabetes mellitus with diabetic chronic kidney disease: Secondary | ICD-10-CM | POA: Diagnosis not present

## 2017-04-10 DIAGNOSIS — J9611 Chronic respiratory failure with hypoxia: Secondary | ICD-10-CM | POA: Diagnosis not present

## 2017-04-10 MED ORDER — ATORVASTATIN CALCIUM 80 MG PO TABS: 80.0000 mg | ORAL_TABLET | Freq: Every day | ORAL | 3 refills | Status: DC

## 2017-04-10 MED ORDER — FERROUS SULFATE 325 (65 FE) MG PO TABS: 325.0000 mg | ORAL_TABLET | Freq: Every day | ORAL | 2 refills | Status: DC

## 2017-04-10 MED ORDER — METFORMIN HCL ER 500 MG PO TB24: 500.0000 mg | ORAL_TABLET | Freq: Two times a day (BID) | ORAL | 0 refills | Status: DC

## 2017-04-10 MED ORDER — ASPIRIN 81 MG PO TBEC: 81.0000 mg | DELAYED_RELEASE_TABLET | Freq: Every day | ORAL | 0 refills | Status: DC

## 2017-04-10 NOTE — Progress Notes (Signed)
   [facility] [name], MD Phone: [phone]  Reason For Visit: Follow up  # HFpEF -States overall she is doing well.  She denies any shortness of breath.  Denies any fluid buildup.  Denies any swelling in her legs.  She has been taking her torsemide 80 mg twice daily and her metolazone twice a week.  #Joint pains -Recently has been seen by rheumatology they have placed her on allopurinol.  She just started taking allopurinol today.  She has not really seen any improvement in her joint pain at this point.    #Obesity -Has been following with the Dr. Jenne Campus.  She feels pretty good about the nutrition help she is been getting.  [name] really looking forward to improving her diet and losing some weight.. Hopes that this will allow her to do little bit more activity.  Past Medical History Reviewed problem list.  Medications- reviewed and updated No additions to family history Social history- patient is a previous- smoker  Objective: BP 122/76   Pulse 95   Temp 98.3 F (36.8 C) (Oral)   Wt 294 lb 3.2 oz (133.4 kg)   SpO2 93%   BMI 53.81 kg/m  Gen: NAD, alert, cooperative with exam Cardio: regular rate and rhythm, S1S2 heard, no murmurs appreciated Pulm: clear to auscultation bilaterally, no wheezes, rhonchi or rales GI: soft, non-tender, non-distended, bowel sounds present, no hepatomegaly, no splenomegaly Skin: dry, intact, no rashes or lesions   Assessment/Plan: See problem based a/p  Chronic diastolic congestive heart failure (HCC) No concern for fluid overload at this visit.  Patient is euvolemic on exam.  She states she feels good. -Regiment of torsemide 80 mg twice daily and metolazone is working well for her -[name] also on a significant amount of K-Dur given she has had low potassium for several months. - Following with cardiology   Gout Rheumatology seeing patient for joint pains, has diagnosed her with gout [name] currently on  allopurinol-is only taken this x1 dose,  she states she has not really felt any improvement at this point  Morbid obesity ([place]) Weight is overall stable Has been following with nutrition Feels like she is making a lot of improvements in her diet Feels very positive about her general outlook  Essential hypertension At next visit will try to start patient on an ACE -holding off for now per patient choice

## 2017-04-10 NOTE — Telephone Encounter (Signed)
I have checked RB's look at. These forms have not been received. I have left a message with Jackelyn Poling to have these refaxed.

## 2017-04-10 NOTE — Patient Instructions (Signed)
It was nice seeing you today.  Please follow-up in 2-3 months.  Looks like you are doing well.  Make sure to try to get that mammogram and to fill out the stool test I sent you.

## 2017-04-11 ENCOUNTER — Other Ambulatory Visit: Payer: Self-pay | Admitting: Cardiology

## 2017-04-11 ENCOUNTER — Other Ambulatory Visit (HOSPITAL_COMMUNITY): Payer: Self-pay | Admitting: Psychiatry

## 2017-04-11 ENCOUNTER — Encounter: Payer: Self-pay | Admitting: Internal Medicine

## 2017-04-11 DIAGNOSIS — F200 Paranoid schizophrenia: Secondary | ICD-10-CM

## 2017-04-11 DIAGNOSIS — M109 Gout, unspecified: Secondary | ICD-10-CM | POA: Insufficient documentation

## 2017-04-11 DIAGNOSIS — I5033 Acute on chronic diastolic (congestive) heart failure: Secondary | ICD-10-CM

## 2017-04-11 DIAGNOSIS — J9611 Chronic respiratory failure with hypoxia: Secondary | ICD-10-CM | POA: Diagnosis not present

## 2017-04-11 HISTORY — DX: Gout, unspecified: M10.9

## 2017-04-11 NOTE — Assessment & Plan Note (Signed)
No concern for fluid overload at this visit.  Patient is euvolemic on exam.  She states she feels good. -Regiment of torsemide 80 mg twice daily and metolazone is working well for her -[name] also on a significant amount of K-Dur given she has had low potassium for several months. - Following with cardiology

## 2017-04-11 NOTE — Telephone Encounter (Signed)
Forms have not been received yet

## 2017-04-11 NOTE — Assessment & Plan Note (Addendum)
At next visit will try to start patient on an ACE -holding off for now per patient choice

## 2017-04-11 NOTE — Assessment & Plan Note (Signed)
Weight is overall stable Has been following with nutrition Feels like she is making a lot of improvements in her diet Feels very positive about her general outlook

## 2017-04-11 NOTE — Assessment & Plan Note (Signed)
Rheumatology seeing patient for joint pains, has diagnosed her with gout [name] currently on allopurinol-is only taken this x1 dose,  she states she has not really felt any improvement at this point

## 2017-04-12 ENCOUNTER — Ambulatory Visit: Payer: Medicare HMO | Admitting: Licensed Clinical Social Worker

## 2017-04-12 DIAGNOSIS — J9611 Chronic respiratory failure with hypoxia: Secondary | ICD-10-CM | POA: Diagnosis not present

## 2017-04-13 DIAGNOSIS — J9611 Chronic respiratory failure with hypoxia: Secondary | ICD-10-CM | POA: Diagnosis not present

## 2017-04-13 NOTE — Telephone Encounter (Signed)
Still have not received forms.   Called and spoke with Debbie. Advised her that we have not received the forms yet. She stated that she will have these forms faxed back to the main number.   Will keep this encounter open until the forms have been received.

## 2017-04-14 DIAGNOSIS — J9611 Chronic respiratory failure with hypoxia: Secondary | ICD-10-CM | POA: Diagnosis not present

## 2017-04-15 DIAGNOSIS — J9611 Chronic respiratory failure with hypoxia: Secondary | ICD-10-CM | POA: Diagnosis not present

## 2017-04-16 ENCOUNTER — Other Ambulatory Visit (HOSPITAL_COMMUNITY): Payer: Self-pay | Admitting: Psychiatry

## 2017-04-16 ENCOUNTER — Ambulatory Visit (INDEPENDENT_AMBULATORY_CARE_PROVIDER_SITE_OTHER): Payer: Medicare HMO | Admitting: Emergency Medicine

## 2017-04-16 ENCOUNTER — Encounter: Payer: Self-pay | Admitting: Emergency Medicine

## 2017-04-16 DIAGNOSIS — J9612 Chronic respiratory failure with hypercapnia: Secondary | ICD-10-CM | POA: Diagnosis not present

## 2017-04-16 DIAGNOSIS — J9611 Chronic respiratory failure with hypoxia: Secondary | ICD-10-CM

## 2017-04-16 NOTE — Telephone Encounter (Addendum)
Called and spoke to Josephine with [facility]. Gave her the fax number in triage so that we can get this over to [place] for patient's appointment that is today.   Faxed received, hand delivered+ to [place]. Nothing further needed.

## 2017-04-16 NOTE — Progress Notes (Signed)
Subjective:    Patient ID: Tiffany [name], female    DOB: 10-27-1960, 57 y.o.   MRN: [id number]  HPI  ROV 11/28/16 --57 year old former smoker (20 pk-yrs) with history of schizophrenia, history of substance abuse, hypertension with diastolic CHF, COPD with asthmatic features based on spirometry performed 05/08/16 that showed severe obstruction.  She has obstructive sleep apnea and obesity hypoventilation syndrome.  She was admitted to the hospital 09/2016 and then again 10/2016.  In September she had acute on chronic respiratory failure with decompensated diastolic heart failure, suffered a PEA arrest and required mechanical ventilation.  She was weaned to BiPAP and then transitioned to a Trilogy ventilator. [name] wearing o2 at all times, 6L/min. [name] currently on Breo and Spiriva . She uses albuterol rarely, a few times a month. [name] now wheezing or coughing. She tells me Tiffany [name] has benefited a lot from diet modification, less salt, etc. Eating smaller meals, trying to exercise a little bit. Offered her the flu shot - she declined.   ROV 04/16/17 --this is a follow-up visit for patient with a history of chronic respiratory failure in the setting of diastolic CHF, COPD/asthma with severe obstruction documented on spirometry, obesity hypoventilation syndrome.  She has been treated in the past with BiPAP and is currently on a trilogy ventilator nightly.  [name] tolerating well.  The primary reason for this visit is a mobility evaluation because she is being considered for a motorized wheelchair. No other complaints today, no other changes or issues to address.     Review of Systems  Constitutional: Positive for unexpected weight change. Negative for fever.  HENT: Positive for dental problem and sinus pressure. Negative for congestion, ear pain, nosebleeds, postnasal drip, rhinorrhea, sneezing, sore throat and trouble swallowing.   Eyes: Negative.  Negative for redness and itching.  Respiratory:  Positive for cough, shortness of breath and wheezing. Negative for chest tightness.   Cardiovascular: Positive for leg swelling. Negative for palpitations.  Gastrointestinal: Negative.  Negative for nausea and vomiting.  Endocrine: Negative.   Genitourinary: Negative.  Negative for dysuria.  Musculoskeletal: Positive for joint swelling.  Skin: Negative.  Negative for rash.  Allergic/Immunologic: Negative.   Neurological: Negative.  Negative for headaches.  Hematological: Bruises/bleeds easily.  Psychiatric/Behavioral: Negative.  Negative for dysphoric mood. The patient is not nervous/anxious.       Objective:   Physical Exam Vitals:   04/16/17 1605 04/16/17 1606  BP:  (!) 160/90  Pulse:  83  SpO2:  96%  Weight: 295 lb (133.8 kg)   Height: 5\' 4"  (1.626 m)    Gen: Pleasant, overwt, in no distress,  normal affect  ENT: No lesions,  mouth clear,  oropharynx clear, no postnasal drip  Neck: No JVD, no TMG, no carotid bruits  Lungs: No use of accessory muscles, clear without rales or rhonchi  Cardiovascular: RRR, heart sounds normal, no murmur or gallops, no peripheral edema  Musculoskeletal: No deformities, no cyanosis or clubbing  Neuro: alert, she has some global weakness, about 4+/5 everywhere except the R LE with is 3/5. She has imbalance when standing or ambulating that place her a a fall risk.   Skin: Warm, no lesions or rashes       Assessment & Plan:  Chronic respiratory failure with hypoxia and hypercapnia (HCC) The patient is here today so that I can ask questions and evaluate her for appropriateness for a motorized wheelchair.  She has a history of chronic respiratory failure  on daytime oxygen and nighttime ventilatory support.  She notes that she barriers to cooking, dressing, self-care, doing ADL's even with walker or standard wheelchair due to exertional dyspnea and documented exertional desaturation w that activity. Her family pushes her current standard chair  for her. Having a power chair would alleviate some of that dyspnea. She can use a cane for only 2-3 minutes due to weakness in her LE, imbalance and dyspnea associated w exertion.  In my opinion she is unable to lift her leg up to get on a scooter safely. She has the capacity to operate the motorized wheelchair after adequate teaching, she is willing to get the device and use it in her home.   In my opinion based on her exam and the above she is appropriate for motorized wheelchair.  I will therefore sign her orders and forward this note to [facility].  Baltazar Apo, MD, PhD 04/16/2017, 4:40 PM [facility] [phone] or if no answer [phone]

## 2017-04-16 NOTE — Assessment & Plan Note (Signed)
The patient is here today so that I can ask questions and evaluate her for appropriateness for a motorized wheelchair.  She has a history of chronic respiratory failure on daytime oxygen and nighttime ventilatory support.  She notes that she barriers to cooking, dressing, self-care, doing ADL's even with walker or standard wheelchair due to exertional dyspnea and documented exertional desaturation w that activity. Her family pushes her current standard chair for her. Having a power chair would alleviate some of that dyspnea. She can use a cane for only 2-3 minutes due to weakness in her LE, imbalance and dyspnea associated w exertion.  In my opinion she is unable to lift her leg up to get on a scooter safely. She has the capacity to operate the motorized wheelchair after adequate teaching, she is willing to get the device and use it in her home.   In my opinion based on her exam and the above she is appropriate for motorized wheelchair.  I will therefore sign her orders and forward this note to [facility].

## 2017-04-17 ENCOUNTER — Telehealth: Payer: Self-pay | Admitting: *Deleted

## 2017-04-17 DIAGNOSIS — J9611 Chronic respiratory failure with hypoxia: Secondary | ICD-10-CM | POA: Diagnosis not present

## 2017-04-17 NOTE — Telephone Encounter (Signed)
Pharmacy requesting rx for a 90-day supply of meloxicam. Please advise. Obed Samek Kennon Holter, CMA

## 2017-04-18 DIAGNOSIS — J9611 Chronic respiratory failure with hypoxia: Secondary | ICD-10-CM | POA: Diagnosis not present

## 2017-04-18 NOTE — Telephone Encounter (Signed)
Patient is currently being treated by Rheumatology for joint pains, no longer prescribing Mobic

## 2017-04-19 ENCOUNTER — Other Ambulatory Visit: Payer: Self-pay

## 2017-04-19 DIAGNOSIS — H2513 Age-related nuclear cataract, bilateral: Secondary | ICD-10-CM | POA: Diagnosis not present

## 2017-04-19 DIAGNOSIS — H40033 Anatomical narrow angle, bilateral: Secondary | ICD-10-CM | POA: Diagnosis not present

## 2017-04-19 DIAGNOSIS — E119 Type 2 diabetes mellitus without complications: Secondary | ICD-10-CM | POA: Diagnosis not present

## 2017-04-19 DIAGNOSIS — H40013 Open angle with borderline findings, low risk, bilateral: Secondary | ICD-10-CM | POA: Diagnosis not present

## 2017-04-19 DIAGNOSIS — J9611 Chronic respiratory failure with hypoxia: Secondary | ICD-10-CM | POA: Diagnosis not present

## 2017-04-19 LAB — HM DIABETES EYE EXAM

## 2017-04-19 MED ORDER — TORSEMIDE 20 MG PO TABS: 80.0000 mg | ORAL_TABLET | Freq: Two times a day (BID) | ORAL | 4 refills | Status: DC

## 2017-04-20 DIAGNOSIS — J9611 Chronic respiratory failure with hypoxia: Secondary | ICD-10-CM | POA: Diagnosis not present

## 2017-04-21 DIAGNOSIS — M6281 Muscle weakness (generalized): Secondary | ICD-10-CM | POA: Diagnosis not present

## 2017-04-21 DIAGNOSIS — R0602 Shortness of breath: Secondary | ICD-10-CM | POA: Diagnosis not present

## 2017-04-21 DIAGNOSIS — G4733 Obstructive sleep apnea (adult) (pediatric): Secondary | ICD-10-CM | POA: Diagnosis not present

## 2017-04-21 DIAGNOSIS — J449 Chronic obstructive pulmonary disease, unspecified: Secondary | ICD-10-CM | POA: Diagnosis not present

## 2017-04-21 DIAGNOSIS — J189 Pneumonia, unspecified organism: Secondary | ICD-10-CM | POA: Diagnosis not present

## 2017-04-21 DIAGNOSIS — J9611 Chronic respiratory failure with hypoxia: Secondary | ICD-10-CM | POA: Diagnosis not present

## 2017-04-21 DIAGNOSIS — R062 Wheezing: Secondary | ICD-10-CM | POA: Diagnosis not present

## 2017-04-21 DIAGNOSIS — J440 Chronic obstructive pulmonary disease with acute lower respiratory infection: Secondary | ICD-10-CM | POA: Diagnosis not present

## 2017-04-21 DIAGNOSIS — I509 Heart failure, unspecified: Secondary | ICD-10-CM | POA: Diagnosis not present

## 2017-04-21 DIAGNOSIS — J45909 Unspecified asthma, uncomplicated: Secondary | ICD-10-CM | POA: Diagnosis not present

## 2017-04-22 DIAGNOSIS — J9611 Chronic respiratory failure with hypoxia: Secondary | ICD-10-CM | POA: Diagnosis not present

## 2017-04-23 ENCOUNTER — Ambulatory Visit (INDEPENDENT_AMBULATORY_CARE_PROVIDER_SITE_OTHER): Payer: Medicare HMO | Admitting: Licensed Clinical Social Worker

## 2017-04-23 DIAGNOSIS — F3341 Major depressive disorder, recurrent, in partial remission: Secondary | ICD-10-CM

## 2017-04-23 DIAGNOSIS — J9611 Chronic respiratory failure with hypoxia: Secondary | ICD-10-CM | POA: Diagnosis not present

## 2017-04-24 DIAGNOSIS — J45909 Unspecified asthma, uncomplicated: Secondary | ICD-10-CM | POA: Diagnosis not present

## 2017-04-24 DIAGNOSIS — G4733 Obstructive sleep apnea (adult) (pediatric): Secondary | ICD-10-CM | POA: Diagnosis not present

## 2017-04-24 DIAGNOSIS — R0602 Shortness of breath: Secondary | ICD-10-CM | POA: Diagnosis not present

## 2017-04-24 DIAGNOSIS — R062 Wheezing: Secondary | ICD-10-CM | POA: Diagnosis not present

## 2017-04-24 DIAGNOSIS — J189 Pneumonia, unspecified organism: Secondary | ICD-10-CM | POA: Diagnosis not present

## 2017-04-24 DIAGNOSIS — J9611 Chronic respiratory failure with hypoxia: Secondary | ICD-10-CM | POA: Diagnosis not present

## 2017-04-24 DIAGNOSIS — J440 Chronic obstructive pulmonary disease with acute lower respiratory infection: Secondary | ICD-10-CM | POA: Diagnosis not present

## 2017-04-25 DIAGNOSIS — J9611 Chronic respiratory failure with hypoxia: Secondary | ICD-10-CM | POA: Diagnosis not present

## 2017-04-26 DIAGNOSIS — J9611 Chronic respiratory failure with hypoxia: Secondary | ICD-10-CM | POA: Diagnosis not present

## 2017-04-27 ENCOUNTER — Other Ambulatory Visit: Payer: Self-pay | Admitting: Internal Medicine

## 2017-04-27 DIAGNOSIS — J9611 Chronic respiratory failure with hypoxia: Secondary | ICD-10-CM | POA: Diagnosis not present

## 2017-04-28 ENCOUNTER — Other Ambulatory Visit: Payer: Self-pay | Admitting: Internal Medicine

## 2017-04-28 DIAGNOSIS — J9611 Chronic respiratory failure with hypoxia: Secondary | ICD-10-CM | POA: Diagnosis not present

## 2017-04-29 DIAGNOSIS — J9611 Chronic respiratory failure with hypoxia: Secondary | ICD-10-CM | POA: Diagnosis not present

## 2017-04-30 DIAGNOSIS — J9611 Chronic respiratory failure with hypoxia: Secondary | ICD-10-CM | POA: Diagnosis not present

## 2017-05-01 DIAGNOSIS — F411 Generalized anxiety disorder: Secondary | ICD-10-CM | POA: Diagnosis not present

## 2017-05-01 DIAGNOSIS — J9611 Chronic respiratory failure with hypoxia: Secondary | ICD-10-CM | POA: Diagnosis not present

## 2017-05-01 DIAGNOSIS — F5081 Binge eating disorder: Secondary | ICD-10-CM | POA: Diagnosis not present

## 2017-05-01 DIAGNOSIS — F331 Major depressive disorder, recurrent, moderate: Secondary | ICD-10-CM | POA: Diagnosis not present

## 2017-05-02 DIAGNOSIS — J189 Pneumonia, unspecified organism: Secondary | ICD-10-CM | POA: Diagnosis not present

## 2017-05-02 DIAGNOSIS — G4733 Obstructive sleep apnea (adult) (pediatric): Secondary | ICD-10-CM | POA: Diagnosis not present

## 2017-05-02 DIAGNOSIS — J9611 Chronic respiratory failure with hypoxia: Secondary | ICD-10-CM | POA: Diagnosis not present

## 2017-05-02 DIAGNOSIS — M6281 Muscle weakness (generalized): Secondary | ICD-10-CM | POA: Diagnosis not present

## 2017-05-02 DIAGNOSIS — I509 Heart failure, unspecified: Secondary | ICD-10-CM | POA: Diagnosis not present

## 2017-05-02 DIAGNOSIS — R0602 Shortness of breath: Secondary | ICD-10-CM | POA: Diagnosis not present

## 2017-05-02 DIAGNOSIS — J449 Chronic obstructive pulmonary disease, unspecified: Secondary | ICD-10-CM | POA: Diagnosis not present

## 2017-05-02 DIAGNOSIS — J45909 Unspecified asthma, uncomplicated: Secondary | ICD-10-CM | POA: Diagnosis not present

## 2017-05-02 DIAGNOSIS — J440 Chronic obstructive pulmonary disease with acute lower respiratory infection: Secondary | ICD-10-CM | POA: Diagnosis not present

## 2017-05-02 DIAGNOSIS — R062 Wheezing: Secondary | ICD-10-CM | POA: Diagnosis not present

## 2017-05-03 DIAGNOSIS — J9611 Chronic respiratory failure with hypoxia: Secondary | ICD-10-CM | POA: Diagnosis not present

## 2017-05-03 DIAGNOSIS — F331 Major depressive disorder, recurrent, moderate: Secondary | ICD-10-CM | POA: Diagnosis not present

## 2017-05-04 DIAGNOSIS — J9611 Chronic respiratory failure with hypoxia: Secondary | ICD-10-CM | POA: Diagnosis not present

## 2017-05-05 DIAGNOSIS — J9611 Chronic respiratory failure with hypoxia: Secondary | ICD-10-CM | POA: Diagnosis not present

## 2017-05-06 DIAGNOSIS — J9611 Chronic respiratory failure with hypoxia: Secondary | ICD-10-CM | POA: Diagnosis not present

## 2017-05-07 DIAGNOSIS — J9611 Chronic respiratory failure with hypoxia: Secondary | ICD-10-CM | POA: Diagnosis not present

## 2017-05-08 DIAGNOSIS — J9611 Chronic respiratory failure with hypoxia: Secondary | ICD-10-CM | POA: Diagnosis not present

## 2017-05-09 DIAGNOSIS — J9611 Chronic respiratory failure with hypoxia: Secondary | ICD-10-CM | POA: Diagnosis not present

## 2017-05-10 ENCOUNTER — Encounter: Payer: Self-pay | Admitting: Internal Medicine

## 2017-05-10 ENCOUNTER — Telehealth: Payer: Self-pay

## 2017-05-10 DIAGNOSIS — J440 Chronic obstructive pulmonary disease with acute lower respiratory infection: Secondary | ICD-10-CM | POA: Diagnosis not present

## 2017-05-10 DIAGNOSIS — J45909 Unspecified asthma, uncomplicated: Secondary | ICD-10-CM | POA: Diagnosis not present

## 2017-05-10 DIAGNOSIS — G4733 Obstructive sleep apnea (adult) (pediatric): Secondary | ICD-10-CM | POA: Diagnosis not present

## 2017-05-10 DIAGNOSIS — J189 Pneumonia, unspecified organism: Secondary | ICD-10-CM | POA: Diagnosis not present

## 2017-05-10 DIAGNOSIS — M6281 Muscle weakness (generalized): Secondary | ICD-10-CM | POA: Diagnosis not present

## 2017-05-10 DIAGNOSIS — J9611 Chronic respiratory failure with hypoxia: Secondary | ICD-10-CM | POA: Diagnosis not present

## 2017-05-10 DIAGNOSIS — R0602 Shortness of breath: Secondary | ICD-10-CM | POA: Diagnosis not present

## 2017-05-10 DIAGNOSIS — J9620 Acute and chronic respiratory failure, unspecified whether with hypoxia or hypercapnia: Secondary | ICD-10-CM | POA: Diagnosis not present

## 2017-05-10 DIAGNOSIS — J449 Chronic obstructive pulmonary disease, unspecified: Secondary | ICD-10-CM | POA: Diagnosis not present

## 2017-05-10 DIAGNOSIS — I509 Heart failure, unspecified: Secondary | ICD-10-CM | POA: Diagnosis not present

## 2017-05-10 DIAGNOSIS — R062 Wheezing: Secondary | ICD-10-CM | POA: Diagnosis not present

## 2017-05-10 NOTE — Telephone Encounter (Signed)
Pharmacy informed of med change. Deseree Kennon Holter, CMA

## 2017-05-10 NOTE — Telephone Encounter (Signed)
Phone call placed to patient to reschedule visit with NP

## 2017-05-11 ENCOUNTER — Other Ambulatory Visit: Payer: Self-pay | Admitting: Internal Medicine

## 2017-05-11 ENCOUNTER — Telehealth: Payer: Self-pay

## 2017-05-11 ENCOUNTER — Telehealth (HOSPITAL_COMMUNITY): Payer: Self-pay | Admitting: Cardiology

## 2017-05-11 DIAGNOSIS — J9611 Chronic respiratory failure with hypoxia: Secondary | ICD-10-CM | POA: Diagnosis not present

## 2017-05-11 DIAGNOSIS — Z1231 Encounter for screening mammogram for malignant neoplasm of breast: Secondary | ICD-10-CM

## 2017-05-11 NOTE — Telephone Encounter (Signed)
PATIENT LEFT VOICEMAIL ON TRIAGE LINE AT 1546 MESSAGE STATES SHE WOULD LIKE SOMEONE TO RETURN CALL AS SHE WAS HAVING SOME SWELLING AND CHEST DISCOMFORT  RETURNED CALL AT 1636 NO ANSWER  LEFT DETAILED MESSAGE, IF SHE CONTINUED TO HAVE CHEST PAINS/DISCOMFORMT SHE SHOULD REPORT TO ER FOR FURTHER EVALUATION. ADVISED I WOULD TRY TO REACH OUT TO HER AGAIN BEFORE 1700, HOWEVER SHE SHOULD STILL REPORT TO ER FOR CP.

## 2017-05-11 NOTE — Telephone Encounter (Signed)
Phone call placed to patient to offer to reschedule visit. Scheduled for Monday 05/14/17 @ 1:00pm

## 2017-05-12 ENCOUNTER — Emergency Department (HOSPITAL_COMMUNITY): Payer: Medicare HMO

## 2017-05-12 ENCOUNTER — Emergency Department (HOSPITAL_COMMUNITY)
Admission: EM | Admit: 2017-05-12 | Discharge: 2017-05-13 | Disposition: A | Discharge status: Home / Self Care | Payer: Medicare HMO | Attending: Emergency Medicine | Admitting: Emergency Medicine

## 2017-05-12 ENCOUNTER — Encounter (HOSPITAL_COMMUNITY): Payer: Self-pay

## 2017-05-12 ENCOUNTER — Other Ambulatory Visit: Payer: Self-pay

## 2017-05-12 DIAGNOSIS — E1140 Type 2 diabetes mellitus with diabetic neuropathy, unspecified: Secondary | ICD-10-CM | POA: Diagnosis not present

## 2017-05-12 DIAGNOSIS — Z87891 Personal history of nicotine dependence: Secondary | ICD-10-CM | POA: Insufficient documentation

## 2017-05-12 DIAGNOSIS — R0602 Shortness of breath: Secondary | ICD-10-CM | POA: Insufficient documentation

## 2017-05-12 DIAGNOSIS — Z7984 Long term (current) use of oral hypoglycemic drugs: Secondary | ICD-10-CM | POA: Diagnosis not present

## 2017-05-12 DIAGNOSIS — Z7982 Long term (current) use of aspirin: Secondary | ICD-10-CM | POA: Insufficient documentation

## 2017-05-12 DIAGNOSIS — I130 Hypertensive heart and chronic kidney disease with heart failure and stage 1 through stage 4 chronic kidney disease, or unspecified chronic kidney disease: Secondary | ICD-10-CM | POA: Diagnosis not present

## 2017-05-12 DIAGNOSIS — R0789 Other chest pain: Secondary | ICD-10-CM | POA: Diagnosis not present

## 2017-05-12 DIAGNOSIS — R0989 Other specified symptoms and signs involving the circulatory and respiratory systems: Secondary | ICD-10-CM | POA: Diagnosis not present

## 2017-05-12 DIAGNOSIS — N189 Chronic kidney disease, unspecified: Secondary | ICD-10-CM | POA: Insufficient documentation

## 2017-05-12 DIAGNOSIS — J449 Chronic obstructive pulmonary disease, unspecified: Secondary | ICD-10-CM | POA: Insufficient documentation

## 2017-05-12 DIAGNOSIS — Z79899 Other long term (current) drug therapy: Secondary | ICD-10-CM | POA: Insufficient documentation

## 2017-05-12 DIAGNOSIS — E1122 Type 2 diabetes mellitus with diabetic chronic kidney disease: Secondary | ICD-10-CM | POA: Diagnosis not present

## 2017-05-12 DIAGNOSIS — I5032 Chronic diastolic (congestive) heart failure: Secondary | ICD-10-CM | POA: Diagnosis not present

## 2017-05-12 DIAGNOSIS — J9611 Chronic respiratory failure with hypoxia: Secondary | ICD-10-CM | POA: Diagnosis not present

## 2017-05-12 LAB — BASIC METABOLIC PANEL
Anion gap: 6 (ref 5–15)
BUN: 28 mg/dL — ABNORMAL HIGH (ref 6–20)
CO2: 35 mmol/L — ABNORMAL HIGH (ref 22–32)
Calcium: 9.5 mg/dL (ref 8.9–10.3)
Chloride: 100 mmol/L — ABNORMAL LOW (ref 101–111)
Creatinine, Ser: 1.23 mg/dL — ABNORMAL HIGH (ref 0.44–1.00)
GFR calc Af Amer: 55 mL/min — ABNORMAL LOW (ref >60)
GFR calc non Af Amer: 47 mL/min — ABNORMAL LOW (ref >60)
Glucose, Bld: 94 mg/dL (ref 65–99)
Potassium: 3.7 mmol/L (ref 3.5–5.1)
Sodium: 141 mmol/L (ref 135–145)

## 2017-05-12 LAB — CBC
HCT: 32.7 % — ABNORMAL LOW (ref 36.0–46.0)
Hemoglobin: 9.8 g/dL — ABNORMAL LOW (ref 12.0–15.0)
MCH: 28.2 pg (ref 26.0–34.0)
MCHC: 30 g/dL (ref 30.0–36.0)
MCV: 94.2 fL (ref 78.0–100.0)
Platelets: 333 10*3/uL (ref 150–400)
RBC: 3.47 MIL/uL — ABNORMAL LOW (ref 3.87–5.11)
RDW: 16.6 % — ABNORMAL HIGH (ref 11.5–15.5)
WBC: 10.3 10*3/uL (ref 4.0–10.5)

## 2017-05-12 LAB — I-STAT TROPONIN, ED
Troponin i, poc: 0.01 ng/mL (ref 0.00–0.08)
Troponin i, poc: 0 ng/mL (ref 0.00–0.08)

## 2017-05-12 LAB — CBG MONITORING, ED: Glucose-Capillary: 87 mg/dL (ref 65–99)

## 2017-05-12 LAB — I-STAT BETA HCG BLOOD, ED (MC, WL, AP ONLY): I-stat hCG, quantitative: <5 m[IU]/mL (ref <5)

## 2017-05-12 LAB — BRAIN NATRIURETIC PEPTIDE: B Natriuretic Peptide: 61.2 pg/mL (ref 0.0–100.0)

## 2017-05-12 LAB — D-DIMER, QUANTITATIVE: D-Dimer, Quant: 0.48 ug/mL-FEU (ref 0.00–0.50)

## 2017-05-12 MED ORDER — ALBUTEROL SULFATE (2.5 MG/3ML) 0.083% IN NEBU
5.0000 mg | INHALATION_SOLUTION | Freq: Once | RESPIRATORY_TRACT | Status: AC
  Administered 2017-05-12: 5 mg via RESPIRATORY_TRACT
  Filled 2017-05-12: qty 6

## 2017-05-12 MED ORDER — METOLAZONE 2.5 MG PO TABS
2.5000 mg | ORAL_TABLET | Freq: Once | ORAL | Status: AC
  Administered 2017-05-13: 2.5 mg via ORAL
  Filled 2017-05-12 (×2): qty 1

## 2017-05-12 MED ORDER — FUROSEMIDE 10 MG/ML IJ SOLN
80.0000 mg | Freq: Once | INTRAMUSCULAR | Status: AC
  Administered 2017-05-12: 80 mg via INTRAVENOUS
  Filled 2017-05-12: qty 8

## 2017-05-12 NOTE — ED Triage Notes (Signed)
Pt arrived via [facility]; per EMS pt c/o SOB x 2 days; pt has crackles in bilateral lobes; Pt has hx of fluid retention and DM; pt on O2 at hm usually 6L. No complaint of CP. 151/90; 82; 96% on 4L; 22

## 2017-05-12 NOTE — ED Notes (Signed)
Pt put on Purewick.   Per PA: Pt okay to have ice chips.

## 2017-05-12 NOTE — ED Provider Notes (Signed)
SOB, h/o chronic resp failure 2nd COPD, CHF, chronic 6 liters Missed multiple doses of diuretics She feels swollen No increase O2 requirement Has CP - atypical No new effusions, normal troponin, BNP Received Lasix, metolazone here  Pending delta trop, d-dimer, tylenol level Anticipate d/ch home but will need re-evaluation Ambulate with pulse ox Will need Rx for diuretics  12:45 - patient has had 550 cc urine output, and a little more given purewick leaked onto the bed requiring change of sheets. The patient feels only a mit better. Will give additional 20 mg IV lasix. Have discussed going home to continue diuretics as prescribed and patient is comfortable with that plan.  Review of her medications finds a significantly high dose of tylenol for the past one month. She reports taking 2000 mg doses, every 4-6 hours through the day since dental work done previously. Tylenol level ordered but there was a delay in draw. Feel this needs to be determined prior to discharge. Patient agrees and understands need for test.   Tylenol level is normal, <10. LFT's pending for review by PCP to insure no liver insult from taking large Tylenol doses x 1 month.   She will need PCP involvement to have regular medications filled as she states insurance will not allow refill right now. One week's worth of Demedex and Zaroxolyn provided.    Charlann Lange, PA-C 05/13/17 5498    Fredia Sorrow, MD 05/13/17 [phone]

## 2017-05-12 NOTE — ED Provider Notes (Signed)
[facility] EMERGENCY DEPARTMENT Provider Note   CSN: [id number] Arrival date & time: 05/12/17  1906     History   Chief Complaint Chief Complaint  Patient presents with  . Shortness of Breath  . Chest Pain    HPI Tiffany [name] is a 57 y.o. female with history of schizophrenia, hypertension, heart failure (EF 60%), COPD, obstructive sleep apnea on CPAP, obesity hypoventilation syndrome, chronic respiratory failure on 6 L nasal cannula, morbid obesity is here for evaluation of shortness of breath for the last 2-3 days associated with diffuse chest discomfort described as "a pulling sensation". Chest discomfort has been constant, nonexertional, nonpleuritic. Has missed fluid pills for the last 2-3 days, states that she dropped some pills on the ground and couldn't use them. Associated symptoms include abdominal swelling and bilateral thigh swelling. States that this typically happens when she misses some fluid pills and her fluid levels are high. He has not had to increase oxygen requirements at home. Usually sleeps sitting up, this has not changed. Typically uses metolazone MWF and torsemide 80 mg twice a day.  No fevers, cough, nausea, vomiting, abdominal pain, changes in bowel movements. Recommended weight 298, last night she waited 302#.   HPI  Past Medical History:  Diagnosis Date  . Anxiety   . Arthritis    "all over" (04/10/2016)  . Asthma   . Cardiac arrest ([place]) 09/08/2016   PEA  . Carotid artery stenosis    1-39% bilateral by dopplers 11/2016  . Chronic bronchitis ([place])   . Chronic diastolic (congestive) heart failure ([place])   . Chronic kidney disease    "I see a kidney dr." (04/10/2016)  . Cocaine abuse ([place])   . Complication of anesthesia    decreased bp, decreased heart rate  . Depression   . Disorder of nervous system   . Emphysema   . GERD (gastroesophageal reflux disease)   . Heart attack ([place]) 1980s  . History of blood transfusion 1994   "couldn't stop bleeding from my period"  . Hyperlipidemia LDL goal <70   . Hypertension   . Incontinence   . Manic depression ([place])   . On home oxygen therapy    "6L; 24/7" (04/10/2016)  . OSA on CPAP    "wear mask sometimes" (04/10/2016)  . Paranoid ([place])    "sometimes; I'm on RX for it" (04/10/2016)  . Pneumonia    "I've had it several times; haven't had it since 06/2015" (04/10/2016)  . Schizophrenia ([place])   . Seasonal allergies   . Seizures ([place])    "don't know what kind; last one was ~ 1 yr ago" (04/10/2016)  . Sinus trouble   . Stroke [facility]) 1980s   denies residual on 04/10/2016  . Type II diabetes mellitus [facility])     Patient Active Problem List   Diagnosis Date Noted  . Gout 04/11/2017  . Screening for breast cancer 03/12/2017  . Prolonged Q-T interval on ECG   . CKD (chronic kidney disease) 12/15/2016  . Carotid artery stenosis   . Osteoarthritis 10/26/2016  . Cardiac arrest ([place]) 09/08/2016  . History of open reduction and internal fixation (ORIF) procedure   . Overactive bladder 06/07/2016  . QT prolongation   . OSA and COPD overlap syndrome ([place])   . Generalized weakness   . Arthritis   . Bipolar I disorder ([place])   . Essential hypertension 03/22/2016  . Bipolar affective disorder, mixed, severe, with psychotic behavior ([place]) 11/28/2015  . Cocaine  use disorder, severe, dependence ([place]) 11/28/2015  . Chronic diastolic congestive heart failure ([place])   . Chronic respiratory failure with hypoxia and hypercapnia ([place]) 06/22/2015  . Tobacco use disorder 07/22/2014  . COPD (chronic obstructive pulmonary disease) ([place]) 07/08/2014  . Seizure ([place]) 01/04/2013  . Health care maintenance 11/25/2012  . Chronic pain syndrome 06/18/2012  . Dyslipidemia 04/24/2011  . Anemia 04/24/2011  . Diabetic neuropathy ([place]) 04/24/2011  . Obstructive sleep apnea 10/18/2010  . Asthma 10/18/2010  . Morbid obesity ([place]) 10/18/2010  . Type 2 diabetes mellitus ([place]) 10/18/2010    Past  Surgical History:  Procedure Laterality Date  . CESAREAN SECTION  1997  . HERNIA REPAIR    . IR CHOLANGIOGRAM EXISTING TUBE  07/20/2016  . IR PERC CHOLECYSTOSTOMY  05/10/2016  . IR RADIOLOGIST EVAL & MGMT  06/08/2016  . IR RADIOLOGIST EVAL & MGMT  06/29/2016  . IR SINUS/FIST TUBE CHK-NON GI  07/12/2016  . TIBIA IM NAIL INSERTION Right 07/12/2016   Procedure: INTRAMEDULLARY (IM) NAIL RIGHT TIBIA;  Surgeon: Leandrew Koyanagi, MD;  Location: [facility];  Service: Orthopedics;  Laterality: Right;  . UMBILICAL HERNIA REPAIR  ~ 1963   "that's why I don't have a belly button"  . VAGINAL HYSTERECTOMY       OB History   None      Home Medications    Prior to Admission medications   Medication Sig Start Date End Date Taking? Authorizing Provider  acetaminophen (TYLENOL) 500 MG tablet Take 1 tablet (500 mg total) by mouth every 6 (six) hours as needed. Patient taking differently: Take 2,000 mg by mouth 3 (three) times daily as needed for headache (pain).  01/31/17  Yes Mikell, Jeani Sow, MD  albuterol (PROVENTIL HFA;VENTOLIN HFA) 108 (90 Base) MCG/ACT inhaler Inhale 2 puffs into the lungs every 6 (six) hours as needed for wheezing or shortness of breath. 02/15/17  Yes Smiley Houseman, MD  albuterol (PROVENTIL) (2.5 MG/3ML) 0.083% nebulizer solution Take 3 mLs (2.5 mg total) every 6 (six) hours as needed by nebulization for wheezing or shortness of breath. 11/21/16  Yes Mikell, Jeani Sow, MD  allopurinol (ZYLOPRIM) 100 MG tablet Take 100 mg by mouth 2 (two) times daily. 04/02/17  Yes [provider]  amLODipine (NORVASC) 5 MG tablet Take 1 tablet (5 mg total) by mouth daily. 02/12/17  Yes Smiley Houseman, MD  aspirin 81 MG EC tablet Take 1 tablet (81 mg total) by mouth daily. Swallow whole. 04/10/17  Yes Mikell, Jeani Sow, MD  atorvastatin (LIPITOR) 80 MG tablet Take 1 tablet (80 mg total) by mouth daily. 04/10/17  Yes Mikell, Jeani Sow, MD  BREO ELLIPTA 100-25 MCG/INH AEPB INHALE 1  PUFF EVERY DAY 12/13/16  Yes Mikell, Jeani Sow, MD  chlorhexidine (PERIDEX) 0.12 % solution Use as directed 15 mLs in the mouth or throat See admin instructions. Swish 1/2 oz (15 mls) for 30 seconds and then spit - twice daily 04/30/17  Yes [provider]  clomiPRAMINE (ANAFRANIL) 50 MG capsule Take 50 mg by mouth at bedtime. 05/02/17  Yes [provider]  Diclofenac Sodium 3 % GEL Use on area of the pain Patient taking differently: Apply 1 application topically daily as needed (pain). Use on area of the pain 12/15/16  Yes Mikell, Jeani Sow, MD  escitalopram (LEXAPRO) 20 MG tablet Take 1 tablet (20 mg total) by mouth daily. 02/16/17  Yes Arfeen, Arlyce Harman, MD  ferrous sulfate 325 (65 FE) MG tablet Take 1  tablet (325 mg total) by mouth daily. 04/10/17  Yes Mikell, Jeani Sow, MD  flunisolide (NASALIDE) 25 MCG/ACT (0.025%) SOLN Place 2 sprays into the nose daily. 03/29/17  Yes Mikell, Jeani Sow, MD  gabapentin (NEURONTIN) 600 MG tablet Take 1 tablet (600 mg total) by mouth 3 (three) times daily. 02/28/17  Yes Mikell, Jeani Sow, MD  lisdexamfetamine (VYVANSE) 40 MG capsule Take 40 mg by mouth daily.   Yes [provider]  metFORMIN (GLUCOPHAGE-XR) 500 MG 24 hr tablet Take 1 tablet (500 mg total) 2 (two) times daily by mouth. 04/27/17  Yes Mikell, Jeani Sow, MD  montelukast (SINGULAIR) 10 MG tablet Take 1 tablet (10 mg total) by mouth at bedtime. 01/29/17  Yes Mikell, Jeani Sow, MD  Multiple Vitamin (MULTIVITAMIN WITH MINERALS) TABS tablet Take 1 tablet by mouth daily.   Yes [provider]  omeprazole (PRILOSEC) 40 MG capsule Take 1 capsule (40 mg total) by mouth 2 (two) times daily. in the morning and evening 02/27/17  Yes Mikell, Jeani Sow, MD  OXYGEN Inhale 6 L into the lungs continuous.    Yes [provider]  potassium chloride (K-DUR) 10 MEQ tablet Take 4 tablets (40 mEq total) by mouth 3 (three) times daily. May also take 4 tablets (40 mEq  total) at bedtime as needed ([place] Metolazone on M/W/F). 03/30/17  Yes Shirley Friar, PA-C  predniSONE (DELTASONE) 5 MG tablet Take 7.5 mg by mouth daily. 04/25/17  Yes [provider]  QUEtiapine (SEROQUEL) 200 MG tablet Take 400 mg by mouth at bedtime. 05/01/17  Yes [provider]  tiotropium (SPIRIVA HANDIHALER) 18 MCG inhalation capsule Place 1 capsule (18 mcg total) into inhaler and inhale daily. 06/13/16  Yes Archie Patten, MD  metFORMIN (GLUCOPHAGE-XR) 500 MG 24 hr tablet Take 1 tablet (500 mg total) by mouth 2 (two) times daily. Patient not taking: Reported on 05/12/2017 04/10/17   [name], MD  metolazone (ZAROXOLYN) 2.5 MG tablet Take 1 tablet (2.5 mg total) by mouth every Monday, Wednesday, and Friday. Take 30 mins prior to taking Torsemide 05/14/17   Charlann Lange, PA-C  mirtazapine (REMERON SOL-TAB) 45 MG disintegrating tablet Take 1 tablet (45 mg total) by mouth at bedtime. Patient not taking: Reported on 05/12/2017 02/16/17 02/16/18  Arfeen, Arlyce Harman, MD  torsemide (DEMADEX) 20 MG tablet Take 4 tablets (80 mg total) by mouth 2 (two) times daily. 05/13/17   Charlann Lange, PA-C    Family History Family History  Problem Relation Age of Onset  . Cancer Father        prostate  . Cancer Mother        lung  . Depression Mother   . Depression Sister   . Anxiety disorder Sister   . Schizophrenia Sister   . Bipolar disorder Sister   . Depression Sister   . Depression Brother   . Heart failure Unknown        cousin    Social History Social History   Tobacco Use  . Smoking status: Former Smoker    Packs/day: 1.50    Years: 38.00    Pack years: 57.00    Types: Cigarettes    Start date: 03/13/1977    Last attempt to quit: 04/10/2016    Years since quitting: 1.0  . Smokeless tobacco: Never Used  Substance Use Topics  . Alcohol use: No    Alcohol/week: 0.0 oz  . Drug use: No    Types: Cocaine  Comment: 04/10/2016 "last used cocaine back in  November 2017"     Allergies   Hydrocodone-acetaminophen; Hydroxyzine; Latuda [lurasidone hcl]; Codeine; Sulfa antibiotics; and Tape   Review of Systems Review of Systems  Respiratory: Positive for shortness of breath.   Cardiovascular: Positive for chest pain and leg swelling.  Gastrointestinal:       Abdominal swelling  All other systems reviewed and are negative.    Physical Exam Updated Vital Signs BP 113/74   Pulse 83   Temp 98.2 F (36.8 C) (Oral)   Resp 17   Ht 5\' 4"  (1.626 m)   Wt (!) 137 kg (302 lb)   SpO2 98%   BMI 51.84 kg/m   Physical Exam  Constitutional: She appears well-developed and well-nourished.  NAD. Non toxic.   HENT:  Head: Normocephalic and atraumatic.  Nose: Nose normal.  Moist mucous membranes. Tonsils and oropharynx normal  Eyes: Conjunctivae, EOM and lids are normal.  Neck: Trachea normal and normal range of motion.  Trachea midline  Cardiovascular: Normal rate, regular rhythm, S1 normal, S2 normal and normal heart sounds.  Pulses:      Carotid pulses are 2+ on the right side, and 2+ on the left side.      Radial pulses are 2+ on the right side, and 2+ on the left side.       Dorsalis pedis pulses are 2+ on the right side, and 2+ on the left side.  RRR. No obvious LE edema. No calf tenderness.   Pulmonary/Chest: Effort normal. No respiratory distress. She has decreased breath sounds. She has no rhonchi. She has no rales.  Decreased breath sounds to lower lobes bilaterally, difficult exam due to body habitus. On 6 L nasal cannula. No tachypnea or hypoxia. No increased work of breathing. No crackles.  Abdominal: Soft. Bowel sounds are normal. There is no tenderness.  Obese abdomen. No epigastric tenderness.  Neurological: [name] alert. GCS eye subscore is 4. GCS verbal subscore is 5. GCS motor subscore is 6.  Skin: Skin is warm and dry. Capillary refill takes less than 2 seconds.  No rash to chest wall  Psychiatric: She has a normal mood  and affect. Her speech is normal and behavior is normal. Judgment and thought content normal. Cognition and memory are normal.     ED Treatments / Results  Labs (all labs ordered are listed, but only abnormal results are displayed) Labs Reviewed  BASIC METABOLIC PANEL - Abnormal; Notable for the following components:      Result Value   Chloride 100 (*)    CO2 35 (*)    BUN 28 (*)    Creatinine, Ser 1.23 (*)    GFR calc non Af Amer 47 (*)    GFR calc Af Amer 55 (*)    All other components within normal limits  CBC - Abnormal; Notable for the following components:   RBC 3.47 (*)    Hemoglobin 9.8 (*)    HCT 32.7 (*)    RDW 16.6 (*)    All other components within normal limits  ACETAMINOPHEN LEVEL - Abnormal; Notable for the following components:   Acetaminophen (Tylenol), Serum <10 (*)    All other components within normal limits  HEPATIC FUNCTION PANEL - Abnormal; Notable for the following components:   Albumin 3.3 (*)    Alkaline Phosphatase 131 (*)    Bilirubin, Direct <0.1 (*)    All other components within normal limits  BRAIN NATRIURETIC PEPTIDE  D-DIMER,  QUANTITATIVE (NOT AT [facility])  CBG MONITORING, ED  I-STAT TROPONIN, ED  I-STAT BETA HCG BLOOD, ED ([facility], [facility], [facility] ONLY)  I-STAT TROPONIN, ED    EKG EKG Interpretation  Date/Time:  Saturday May 12 2017 19:07:25 EDT Ventricular Rate:  82 PR Interval:    QRS Duration: 73 QT Interval:  367 QTC Calculation: 429 R Axis:   26 Text Interpretation:  Sinus rhythm Probable anteroseptal infarct, old Nonspecific T abnormalities, lateral leads When compared with ECG of 01/19/2017, No significant change was found Confirmed by Delora Fuel ([id number]) on 05/13/2017 12:15:18 AM Also confirmed by Delora Fuel ([id number]), editor [place], Jeannetta Nap [phone])  on 05/13/2017 8:15:34 AM   EKG Interpretation  Date/Time:  Saturday May 12 2017 19:07:25 EDT Ventricular Rate:  82 PR Interval:    QRS Duration: 73 QT Interval:  367 QTC  Calculation: 429 R Axis:   26 Text Interpretation:  Sinus rhythm Probable anteroseptal infarct, old Nonspecific T abnormalities, lateral leads When compared with ECG of 01/19/2017, No significant change was found Confirmed by Delora Fuel ([id number]) on 05/13/2017 12:15:18 AM Also confirmed by Delora Fuel ([id number]), editor [place], Jeannetta Nap [phone])  on 05/13/2017 8:15:34 AM  Radiology Dg Chest 2 View  Result Date: 05/12/2017 CLINICAL DATA:  Pt arrived via [facility]; per EMS pt c/o SOB x 2 days; pt has crackles in bilateral lobes; Pt has hx of fluid retention and DM; pt on O2 at hm usually 6L. Patient reports mid and left sided chest pressure x a few days as well. Hx of HTN and CAD. EXAM: CHEST - 2 VIEW COMPARISON:  01/19/2017 FINDINGS: Cardiac silhouette is mildly enlarged. No mediastinal or hilar masses. No evidence of adenopathy. Clear lungs.  No convincing pleural effusion.  No pneumothorax. Skeletal structures are grossly intact. IMPRESSION: No acute cardiopulmonary disease. Electronically Signed   By: Lajean Manes M.D.   On: 05/12/2017 19:55    Procedures Procedures (including critical care time)  Medications Ordered in ED Medications  albuterol (PROVENTIL) (2.5 MG/3ML) 0.083% nebulizer solution 5 mg (5 mg Nebulization Given 05/12/17 1948)  furosemide (LASIX) injection 80 mg (80 mg Intravenous Given 05/12/17 2224)  metolazone (ZAROXOLYN) tablet 2.5 mg (2.5 mg Oral Given 05/13/17 0024)  furosemide (LASIX) injection 20 mg (20 mg Intravenous Given 05/13/17 0118)     Initial Impression / Assessment and Plan / ED Course  I have reviewed the triage vital signs and the nursing notes.  Pertinent labs & imaging results that were available during my care of the patient were reviewed by me and considered in my medical decision making (see chart for details).  Clinical Course as of May 13 1045  Sat May 12, 2017  2234 Rn/pharmacist notified me pt takes four 500 mg tylenol BID, requesting APAP level.    [CG]     Clinical Course User Index [CG] Kinnie Feil, PA-C   57 year old female here with shortness of breath, chest discomfort, abdominal and bilateral thigh swelling. She has mised 2-3 days of her diuretics.   On exam, no fever, tachycardia, tachypnea or hypoxia. No new oxygen requirements. Does not look obviously fluid overloaded.   Lab work, chest x-ray, EKG reviewed. Remarkable for stable anemia hemoglobin 9.8. Initial troponin negative. BNP WNL. Creatinine at baseline. Chest x-ray without effusion, infiltrate. Suspicion for pulmonary embolism is low, she has no tachycardia, tachypnea, hypoxia and history and exam was consistent with likely fluid up due to medical noncompliance.  Given low suspicion will opt for d-dimer.   Final Clinical Impressions(s) /  ED Diagnoses   2230: patient given dose of diuretics in the ED. Vital signs have remained stable. Pending delta troponin, d-dimer, Tylenol level. If benign, to state discharge. Patient will be handed off to the oncoming ED PA who will follow up on labs, reevaluate and determine disposition. Final diagnoses:  Shortness of breath    ED Discharge Orders        Ordered    metolazone (ZAROXOLYN) 2.5 MG tablet  Every M-W-F     05/13/17 0217    torsemide (DEMADEX) 20 MG tablet  2 times daily     05/13/17 0217       Kinnie Feil, PA-C 05/13/17 1047    Fredia Sorrow, MD 05/13/17 2052

## 2017-05-13 DIAGNOSIS — J9611 Chronic respiratory failure with hypoxia: Secondary | ICD-10-CM | POA: Diagnosis not present

## 2017-05-13 DIAGNOSIS — R0602 Shortness of breath: Secondary | ICD-10-CM | POA: Diagnosis not present

## 2017-05-13 LAB — HEPATIC FUNCTION PANEL
ALT: 16 U/L (ref 14–54)
AST: 15 U/L (ref 15–41)
Albumin: 3.3 g/dL — ABNORMAL LOW (ref 3.5–5.0)
Alkaline Phosphatase: 131 U/L — ABNORMAL HIGH (ref 38–126)
Bilirubin, Direct: <0.1 mg/dL — ABNORMAL LOW (ref 0.1–0.5)
Total Bilirubin: 0.3 mg/dL (ref 0.3–1.2)
Total Protein: 6.9 g/dL (ref 6.5–8.1)

## 2017-05-13 LAB — ACETAMINOPHEN LEVEL: Acetaminophen (Tylenol), Serum: <10 ug/mL — ABNORMAL LOW (ref 10–30)

## 2017-05-13 MED ORDER — FUROSEMIDE 10 MG/ML IJ SOLN
20.0000 mg | Freq: Once | INTRAMUSCULAR | Status: AC
  Administered 2017-05-13: 20 mg via INTRAVENOUS
  Filled 2017-05-13: qty 2

## 2017-05-13 MED ORDER — TORSEMIDE 20 MG PO TABS: 80.0000 mg | ORAL_TABLET | Freq: Two times a day (BID) | ORAL | 0 refills | Status: DC

## 2017-05-13 MED ORDER — METOLAZONE 2.5 MG PO TABS: 2.5000 mg | ORAL_TABLET | ORAL | 0 refills | Status: DC

## 2017-05-13 NOTE — Discharge Instructions (Signed)
Please take your Lasix (furosemide) Zaroxolyn as prescribed. Follow up with your doctor to coordinate being able to refill your medications as usual. Limit your Tylenol doses to 4000 mg in any 24 hours period. Follow up with your doctor for results of your liver function tests which are still pending.

## 2017-05-13 NOTE — ED Notes (Signed)
Pt discharged from ED; instructions provided and scripts given; Pt encouraged to return to ED if symptoms worsen and to f/u with PCP; Pt verbalized understanding of all instructions 

## 2017-05-14 ENCOUNTER — Ambulatory Visit: Payer: Medicare HMO | Admitting: Family Medicine

## 2017-05-14 ENCOUNTER — Other Ambulatory Visit: Payer: Medicare HMO | Admitting: Internal Medicine

## 2017-05-14 ENCOUNTER — Telehealth: Payer: Self-pay

## 2017-05-14 DIAGNOSIS — J9611 Chronic respiratory failure with hypoxia: Secondary | ICD-10-CM | POA: Diagnosis not present

## 2017-05-14 DIAGNOSIS — I5032 Chronic diastolic (congestive) heart failure: Secondary | ICD-10-CM

## 2017-05-14 DIAGNOSIS — R0602 Shortness of breath: Secondary | ICD-10-CM

## 2017-05-14 DIAGNOSIS — Z9981 Dependence on supplemental oxygen: Secondary | ICD-10-CM | POA: Insufficient documentation

## 2017-05-14 NOTE — Progress Notes (Signed)
PALLIATIVE CARE CONSULT VISIT   PATIENT NAME: Tiffany [name] DOB: [birth year] MRN: [id number]  PRIMARY CARE PROVIDER: [name], MD  REFERRING PROVIDER: [name], [place], [place] [address]  RESPONSIBLE PARTY:  Self  BILLABLE DIAGNOSIS:R06.02, Z99.81, I50.32   RECOMMENDATIONS and PLAN:  1.  Shortness of breath/Dependence on continuous oxygen:  Daily occurrences especially upon exertion.    2.  Chronic diastolic congestive heart failure:  Exacerbation with ER visit on 4/27 due to absence of month's supply of Torsemide.  No admission but having financial difficulty in attaining replacement Pharmacy and PCP's office contacted with attempts to assist in this process.  Pt. Can afford a portion of the rx .  Plan on contact with social worker for potential pt. Assistance to prevent recurrent exacerbation. 3  Advanced care planning:Full scope of treatment.  Plan on referral to social worker to discuss possible sources for attaining rx.  .  .  I spent 45 minutes providing this consultation at home,  from 1:00PM to 1:45PM. More than 50% of the time in this consultation was spent coordinating communication.   HISTORY OF PRESENT ILLNESS:  Followup with Adriana Mccallum finds that she was evaluated and treated for increased shortness of breath and fluid overload.  She reports that she has not been able to take her supply of Torsemide for aprox. 3 days because her tablets were damaged.  [name] unable to refill Rx until May 24 and financially cannot afford a partial Rx fill that was provided by the ER.  Reports that she continues to have shortness of breath upon exertion even with use of O2 at 6liters per min.    CODE STATUS: FULL CODE  PPS: 50% HOSPICE ELIGIBILITY/DIAGNOSIS: TBD  PAST MEDICAL HISTORY:  Past Medical History:  Diagnosis Date  . Anxiety   . Arthritis    "all over" (04/10/2016)  . Asthma   . Cardiac arrest ([place]) 09/08/2016   PEA  . Carotid  artery stenosis    1-39% bilateral by dopplers 11/2016  . Chronic bronchitis ([place])   . Chronic diastolic (congestive) heart failure ([place])   . Chronic kidney disease    "I see a kidney dr." (04/10/2016)  . Cocaine abuse ([place])   . Complication of anesthesia    decreased bp, decreased heart rate  . Depression   . Disorder of nervous system   . Emphysema   . GERD (gastroesophageal reflux disease)   . Heart attack ([place]) 1980s  . History of blood transfusion 1994   "couldn't stop bleeding from my period"  . Hyperlipidemia LDL goal <70   . Hypertension   . Incontinence   . Manic depression ([place])   . On home oxygen therapy    "6L; 24/7" (04/10/2016)  . OSA on CPAP    "wear mask sometimes" (04/10/2016)  . Paranoid ([place])    "sometimes; I'm on RX for it" (04/10/2016)  . Pneumonia    "I've had it several times; haven't had it since 06/2015" (04/10/2016)  . Schizophrenia ([place])   . Seasonal allergies   . Seizures ([place])    "don't know what kind; last one was ~ 1 yr ago" (04/10/2016)  . Sinus trouble   . Stroke [facility]) 1980s   denies residual on 04/10/2016  . Type II diabetes mellitus ([place])     SOCIAL HX:  Social History   Tobacco Use  . Smoking status: Former Smoker    Packs/day: 1.50  Years: 38.00    Pack years: 57.00    Types: Cigarettes    Start date: 03/13/1977    Last attempt to quit: 04/10/2016    Years since quitting: 1.0  . Smokeless tobacco: Never Used  Substance Use Topics  . Alcohol use: No    Alcohol/week: 0.0 oz    ALLERGIES:  Allergies  Allergen Reactions  . Hydrocodone-Acetaminophen Shortness Of Breath  . Hydroxyzine Anaphylaxis and Shortness Of Breath  . Latuda [Lurasidone Hcl] Anaphylaxis  . Codeine Nausea And Vomiting  . Sulfa Antibiotics Itching  . Tape Rash     PERTINENT MEDICATIONS:  Outpatient Encounter Medications as of 05/14/2017  Medication Sig  . acetaminophen (TYLENOL) 500 MG tablet Take 1 tablet (500 mg total) by mouth every 6 (six) hours as  needed. (Patient taking differently: Take 2,000 mg by mouth 3 (three) times daily as needed for headache (pain). )  . albuterol (PROVENTIL HFA;VENTOLIN HFA) 108 (90 Base) MCG/ACT inhaler Inhale 2 puffs into the lungs every 6 (six) hours as needed for wheezing or shortness of breath.  [name] albuterol (PROVENTIL) (2.5 MG/3ML) 0.083% nebulizer solution Take 3 mLs (2.5 mg total) every 6 (six) hours as needed by nebulization for wheezing or shortness of breath.  . allopurinol (ZYLOPRIM) 100 MG tablet Take 100 mg by mouth 2 (two) times daily.  [name] amLODipine (NORVASC) 5 MG tablet Take 1 tablet (5 mg total) by mouth daily.  [name] aspirin 81 MG EC tablet Take 1 tablet (81 mg total) by mouth daily. Swallow whole.  [name] atorvastatin (LIPITOR) 80 MG tablet Take 1 tablet (80 mg total) by mouth daily.  [name] BREO ELLIPTA 100-25 MCG/INH AEPB INHALE 1 PUFF EVERY DAY  . chlorhexidine (PERIDEX) 0.12 % solution Use as directed 15 mLs in the mouth or throat See admin instructions. Swish 1/2 oz (15 mls) for 30 seconds and then spit - twice daily  . clomiPRAMINE (ANAFRANIL) 50 MG capsule Take 50 mg by mouth at bedtime.  . Diclofenac Sodium 3 % GEL Use on area of the pain (Patient taking differently: Apply 1 application topically daily as needed (pain). Use on area of the pain)  . escitalopram (LEXAPRO) 20 MG tablet Take 1 tablet (20 mg total) by mouth daily.  . ferrous sulfate 325 (65 FE) MG tablet Take 1 tablet (325 mg total) by mouth daily.  . flunisolide (NASALIDE) 25 MCG/ACT (0.025%) SOLN Place 2 sprays into the nose daily.  [name] gabapentin (NEURONTIN) 600 MG tablet Take 1 tablet (600 mg total) by mouth 3 (three) times daily.  [name] lisdexamfetamine (VYVANSE) 40 MG capsule Take 40 mg by mouth daily.  . metFORMIN (GLUCOPHAGE-XR) 500 MG 24 hr tablet Take 1 tablet (500 mg total) by mouth 2 (two) times daily. (Patient not taking: Reported on 05/12/2017)  . metFORMIN (GLUCOPHAGE-XR) 500 MG 24 hr tablet Take 1 tablet (500 mg total) 2 (two) times  daily by mouth.  . metolazone (ZAROXOLYN) 2.5 MG tablet Take 1 tablet (2.5 mg total) by mouth every Monday, Wednesday, and Friday. Take 30 mins prior to taking Torsemide  . mirtazapine (REMERON SOL-TAB) 45 MG disintegrating tablet Take 1 tablet (45 mg total) by mouth at bedtime. (Patient not taking: Reported on 05/12/2017)  . montelukast (SINGULAIR) 10 MG tablet Take 1 tablet (10 mg total) by mouth at bedtime.  . Multiple Vitamin (MULTIVITAMIN WITH MINERALS) TABS tablet Take 1 tablet by mouth daily.  [name] omeprazole (PRILOSEC) 40 MG capsule Take 1 capsule (40 mg total) by mouth 2 (two)  times daily. in the morning and evening  . OXYGEN Inhale 6 L into the lungs continuous.   . potassium chloride (K-DUR) 10 MEQ tablet Take 4 tablets (40 mEq total) by mouth 3 (three) times daily. May also take 4 tablets (40 mEq total) at bedtime as needed (Wtih Metolazone on M/W/F).  [name] predniSONE (DELTASONE) 5 MG tablet Take 7.5 mg by mouth daily.  . QUEtiapine (SEROQUEL) 200 MG tablet Take 400 mg by mouth at bedtime.  [name] tiotropium (SPIRIVA HANDIHALER) 18 MCG inhalation capsule Place 1 capsule (18 mcg total) into inhaler and inhale daily.  [name] torsemide (DEMADEX) 20 MG tablet Take 4 tablets (80 mg total) by mouth 2 (two) times daily.   No facility-administered encounter medications on file as of 05/14/2017.     PHYSICAL EXAM:   General: Morbidly obese female.  In NAD Cardiovascular: regular rate and rhythm Pulmonary: clear throughout.  O2 sats 96% Abdomen: large, soft, nontender, + bowel sounds Extremities: no edema, no joint deformities Skin: no rashes Neurological: Alert and oriented x3, Generalized weakness  Gonzella Lex, NP-C

## 2017-05-14 NOTE — Telephone Encounter (Signed)
Gonzella Lex, NP with [facility] would like to speak with PCP about patient's Torsemide that was prescribed.  Call back is [phone].  Danley Danker, RN [facility] RN)

## 2017-05-15 ENCOUNTER — Ambulatory Visit: Payer: Self-pay | Admitting: Family Medicine

## 2017-05-15 DIAGNOSIS — J9611 Chronic respiratory failure with hypoxia: Secondary | ICD-10-CM | POA: Diagnosis not present

## 2017-05-15 MED ORDER — FUROSEMIDE 80 MG PO TABS: 160.0000 mg | ORAL_TABLET | Freq: Two times a day (BID) | ORAL | 1 refills | Status: DC

## 2017-05-15 MED ORDER — TORSEMIDE 20 MG PO TABS: 80.0000 mg | ORAL_TABLET | Freq: Two times a day (BID) | ORAL | 1 refills | Status: DC

## 2017-05-15 NOTE — Telephone Encounter (Signed)
Called Tiffany [name] to discuss her issues with refilling her torsemide. Left a message letting her know I have sent in prescription of Lasix to see if insurance will pay for that and to call back if any further questions.

## 2017-05-16 ENCOUNTER — Ambulatory Visit (INDEPENDENT_AMBULATORY_CARE_PROVIDER_SITE_OTHER): Payer: Medicare HMO | Admitting: Internal Medicine

## 2017-05-16 ENCOUNTER — Encounter: Payer: Self-pay | Admitting: Internal Medicine

## 2017-05-16 VITALS — BP 130/90 | HR 92 | Temp 99.1°F | Wt 299.2 lb

## 2017-05-16 DIAGNOSIS — J45909 Unspecified asthma, uncomplicated: Secondary | ICD-10-CM

## 2017-05-16 DIAGNOSIS — E1142 Type 2 diabetes mellitus with diabetic polyneuropathy: Secondary | ICD-10-CM

## 2017-05-16 DIAGNOSIS — I5032 Chronic diastolic (congestive) heart failure: Secondary | ICD-10-CM

## 2017-05-16 DIAGNOSIS — J9611 Chronic respiratory failure with hypoxia: Secondary | ICD-10-CM | POA: Diagnosis not present

## 2017-05-16 DIAGNOSIS — E785 Hyperlipidemia, unspecified: Secondary | ICD-10-CM | POA: Diagnosis not present

## 2017-05-16 DIAGNOSIS — J3489 Other specified disorders of nose and nasal sinuses: Secondary | ICD-10-CM | POA: Insufficient documentation

## 2017-05-16 MED ORDER — SALINE SPRAY 0.65 % NA SOLN: 2.0000 | Freq: Four times a day (QID) | NASAL | 0 refills | Status: DC

## 2017-05-16 NOTE — Patient Instructions (Signed)
Make sure to take metolazone Wednesday, Thursday, Friday, Saturday and then restart your regular regiment.  Please follow-up with me in about 1 week to see how things are going.  I am going to prescribe you a nasal saline to see if that helps with the dryness in your nares

## 2017-05-16 NOTE — Progress Notes (Signed)
   [name] [name], MD Phone: [phone]  Reason For Visit:  ED Follow up   #SOB  Patient was recently seen in the ED with shortness of breath on 4/24.  Patient has missed multiple doses of her torsemide at that point due to spilling her medication on the floor.  Patient was diuresed in the ED. she was able to get a small amount of torsemide and metolazone refilled.  However her medication ran out yesterday.  She still feels a little bit fluid overloaded specifically in her abdomen.  She states that prior to losing her torsemide pills her weight was at 292 lbs.  She denies any swelling in her legs.  She states that her breathing has improved significantly and is almost back to normal prior to the ED visit.  She has been unable to refill her torsemide medication as her insurance will not pay the co-pay given she is not up for a refill.   # Pain in Nose  -Notes some nasal pain. She states that the oxygen often irritates her nasal passages.  She states this is been going on for several months to years. [name] wondering if there is anything to be done about it  Past Medical History Reviewed problem list.  Medications- reviewed and updated No additions to family history Social history- patient is a non smoker  Objective: BP 130/90 (BP Location: Right Wrist, Patient Position: Sitting, Cuff Size: Normal)   Pulse 92   Temp 99.1 F (37.3 C) (Oral)   Wt 299 lb 3.2 oz (135.7 kg)   SpO2 94%   BMI 51.36 kg/m  Gen: NAD, alert, cooperative with exam HEENT: Normal    Nose: small ulcerations from placement of nasal prongs     Throat: moist mucus membranes, no erythema Cardio: regular rate and rhythm, S1S2 heard, no murmurs appreciated Pulm: clear to auscultation bilaterally, no wheezes, rhonchi or rales Extremities: warm, well perfused, No edema   Assessment/Plan: See problem based a/p  Chronic diastolic congestive heart failure (HCC) Weight slightly up  299lbs from last visit when patient had a dry weight of 294 lbs  - Will increase metalozone this week - Wednesday, Thursday, Friday, Saturday - then back to old regiment  - Will switch to lasix 160 mg BID for 1 month - then switch back to toresmide 80 mg BID  - Follow up in 1 week   Nasal dryness Patient does currently have a humidifier at home  Recommend adding QID nasal saline to help keep patient's nasal passages moisturized

## 2017-05-16 NOTE — Assessment & Plan Note (Signed)
Weight slightly up 299lbs from last visit when patient had a dry weight of 294 lbs  - Will increase metalozone this week - Wednesday, Thursday, Friday, Saturday - then back to old regiment  - Will switch to lasix 160 mg BID for 1 month - then switch back to toresmide 80 mg BID  - Follow up in 1 week

## 2017-05-16 NOTE — Assessment & Plan Note (Signed)
Patient does currently have a humidifier at home  Recommend adding QID nasal saline to help keep patient's nasal passages moisturized

## 2017-05-17 ENCOUNTER — Ambulatory Visit (HOSPITAL_COMMUNITY): Payer: Medicare HMO | Admitting: Psychiatry

## 2017-05-17 DIAGNOSIS — J9611 Chronic respiratory failure with hypoxia: Secondary | ICD-10-CM | POA: Diagnosis not present

## 2017-05-18 ENCOUNTER — Encounter: Payer: Self-pay | Admitting: Internal Medicine

## 2017-05-18 DIAGNOSIS — J9611 Chronic respiratory failure with hypoxia: Secondary | ICD-10-CM | POA: Diagnosis not present

## 2017-05-18 MED ORDER — MONTELUKAST SODIUM 10 MG PO TABS: 10.0000 mg | ORAL_TABLET | Freq: Every day | ORAL | 1 refills | Status: DC

## 2017-05-18 MED ORDER — OMEPRAZOLE 40 MG PO CPDR: 40.0000 mg | DELAYED_RELEASE_CAPSULE | Freq: Every day | ORAL | 2 refills | Status: DC

## 2017-05-18 MED ORDER — TORSEMIDE 20 MG PO TABS: 80.0000 mg | ORAL_TABLET | Freq: Two times a day (BID) | ORAL | 4 refills | Status: DC

## 2017-05-18 MED ORDER — ATORVASTATIN CALCIUM 80 MG PO TABS: 80.0000 mg | ORAL_TABLET | Freq: Every day | ORAL | 3 refills | Status: DC

## 2017-05-18 MED ORDER — GABAPENTIN 600 MG PO TABS: 600.0000 mg | ORAL_TABLET | Freq: Three times a day (TID) | ORAL | 0 refills | Status: DC

## 2017-05-18 MED ORDER — METFORMIN HCL ER 500 MG PO TB24: 500.0000 mg | ORAL_TABLET | Freq: Two times a day (BID) | ORAL | 2 refills | Status: DC

## 2017-05-19 DIAGNOSIS — J9611 Chronic respiratory failure with hypoxia: Secondary | ICD-10-CM | POA: Diagnosis not present

## 2017-05-20 DIAGNOSIS — J9611 Chronic respiratory failure with hypoxia: Secondary | ICD-10-CM | POA: Diagnosis not present

## 2017-05-21 ENCOUNTER — Other Ambulatory Visit: Payer: Self-pay | Admitting: *Deleted

## 2017-05-21 DIAGNOSIS — R0602 Shortness of breath: Secondary | ICD-10-CM | POA: Diagnosis not present

## 2017-05-21 DIAGNOSIS — J45909 Unspecified asthma, uncomplicated: Secondary | ICD-10-CM | POA: Diagnosis not present

## 2017-05-21 DIAGNOSIS — R062 Wheezing: Secondary | ICD-10-CM | POA: Diagnosis not present

## 2017-05-21 DIAGNOSIS — J9611 Chronic respiratory failure with hypoxia: Secondary | ICD-10-CM | POA: Diagnosis not present

## 2017-05-21 DIAGNOSIS — F064 Anxiety disorder due to known physiological condition: Secondary | ICD-10-CM | POA: Diagnosis not present

## 2017-05-21 DIAGNOSIS — F331 Major depressive disorder, recurrent, moderate: Secondary | ICD-10-CM | POA: Diagnosis not present

## 2017-05-21 DIAGNOSIS — J449 Chronic obstructive pulmonary disease, unspecified: Secondary | ICD-10-CM | POA: Diagnosis not present

## 2017-05-21 DIAGNOSIS — M6281 Muscle weakness (generalized): Secondary | ICD-10-CM | POA: Diagnosis not present

## 2017-05-21 DIAGNOSIS — I509 Heart failure, unspecified: Secondary | ICD-10-CM | POA: Diagnosis not present

## 2017-05-21 DIAGNOSIS — J440 Chronic obstructive pulmonary disease with acute lower respiratory infection: Secondary | ICD-10-CM | POA: Diagnosis not present

## 2017-05-21 DIAGNOSIS — G4733 Obstructive sleep apnea (adult) (pediatric): Secondary | ICD-10-CM | POA: Diagnosis not present

## 2017-05-21 DIAGNOSIS — J189 Pneumonia, unspecified organism: Secondary | ICD-10-CM | POA: Diagnosis not present

## 2017-05-21 MED ORDER — FUROSEMIDE 80 MG PO TABS: 160.0000 mg | ORAL_TABLET | Freq: Two times a day (BID) | ORAL | 1 refills | Status: DC

## 2017-05-22 ENCOUNTER — Telehealth: Payer: Self-pay | Admitting: Internal Medicine

## 2017-05-22 DIAGNOSIS — J9611 Chronic respiratory failure with hypoxia: Secondary | ICD-10-CM | POA: Diagnosis not present

## 2017-05-22 DIAGNOSIS — F319 Bipolar disorder, unspecified: Secondary | ICD-10-CM

## 2017-05-22 NOTE — Telephone Encounter (Signed)
Please see my telephone note from today.

## 2017-05-22 NOTE — Telephone Encounter (Signed)
Forward:  Pt would like Dr Emmaline Life to give her a call as soon as possible. She said she needs to ask her some questions about some of her medicines. I asked which medicine and she just said a couple of them.   [name], [place]

## 2017-05-22 NOTE — Telephone Encounter (Signed)
Patient called with questions about tramadol prescribed in the ED. She wanted to make sure this would not negatively affect her COPD. She also has OSA but reports consistently wearing her CPAP machine. She also requests referral be placed to [facility] to continue psychiatry and counseling services by PA Patriciaann Clan and LCSW Dede Query, as she feels they are making progress. Placed referral. If not covered by her insurance, could suggest counseling at [facility].  Olene Floss, MD [place], PGY-3

## 2017-05-22 NOTE — Telephone Encounter (Signed)
Pt would like Dr Emmaline Life to give her a call as soon as possible. She said she needs to ask her some questions about some of her medicines. I asked which medicine and she just said a couple of them.

## 2017-05-23 DIAGNOSIS — J9611 Chronic respiratory failure with hypoxia: Secondary | ICD-10-CM | POA: Diagnosis not present

## 2017-05-24 ENCOUNTER — Telehealth: Payer: Self-pay | Admitting: Internal Medicine

## 2017-05-24 DIAGNOSIS — G4733 Obstructive sleep apnea (adult) (pediatric): Secondary | ICD-10-CM | POA: Diagnosis not present

## 2017-05-24 DIAGNOSIS — J189 Pneumonia, unspecified organism: Secondary | ICD-10-CM | POA: Diagnosis not present

## 2017-05-24 DIAGNOSIS — R062 Wheezing: Secondary | ICD-10-CM | POA: Diagnosis not present

## 2017-05-24 DIAGNOSIS — J9611 Chronic respiratory failure with hypoxia: Secondary | ICD-10-CM | POA: Diagnosis not present

## 2017-05-24 DIAGNOSIS — R0602 Shortness of breath: Secondary | ICD-10-CM | POA: Diagnosis not present

## 2017-05-24 DIAGNOSIS — J45909 Unspecified asthma, uncomplicated: Secondary | ICD-10-CM | POA: Diagnosis not present

## 2017-05-24 DIAGNOSIS — J440 Chronic obstructive pulmonary disease with acute lower respiratory infection: Secondary | ICD-10-CM | POA: Diagnosis not present

## 2017-05-24 NOTE — Telephone Encounter (Signed)
Pt has an appt with dr Gilberto Better with [facility] on Monday 05-28-17.  Please fax the referral to [phone]

## 2017-05-25 ENCOUNTER — Telehealth: Payer: Self-pay | Admitting: Emergency Medicine

## 2017-05-25 DIAGNOSIS — J9611 Chronic respiratory failure with hypoxia: Secondary | ICD-10-CM | POA: Diagnosis not present

## 2017-05-25 NOTE — Telephone Encounter (Signed)
Called and spoke with pt who stated she could not breathe last night, she even stated she stopped breathing while wearing her cpap.  Pt states she has fluid built up in her lungs and has to push the fluid out to be able to breathe again.  Pt stated her symptoms has been happening this week and is wanting to know recommendations from Dr. Lamonte Sakai.  Dr. Lamonte Sakai, please advise on this for pt. Thanks!

## 2017-05-25 NOTE — Telephone Encounter (Signed)
Discussed with her - sounds like she is experiencing chest congestion that moved but she is unable to clear completely.  She also is having nasal congestion.  [name] using her nasal saline spray, Singulair.  I asked her to try adding Mucinex to see if this will help her clear her congestion she will do this and call me next week to let me know if is been effective.

## 2017-05-25 NOTE — Telephone Encounter (Signed)
I will fax referral order that was placed 05/22/17, but the referral has not yet been authorized. Cannot confirm that visit will be covered.   Olene Floss, MD [place], PGY-3

## 2017-05-26 DIAGNOSIS — J9611 Chronic respiratory failure with hypoxia: Secondary | ICD-10-CM | POA: Diagnosis not present

## 2017-05-27 DIAGNOSIS — J9611 Chronic respiratory failure with hypoxia: Secondary | ICD-10-CM | POA: Diagnosis not present

## 2017-05-28 ENCOUNTER — Encounter: Payer: Self-pay | Admitting: Internal Medicine

## 2017-05-28 ENCOUNTER — Ambulatory Visit (INDEPENDENT_AMBULATORY_CARE_PROVIDER_SITE_OTHER): Payer: Medicare HMO | Admitting: Internal Medicine

## 2017-05-28 VITALS — BP 130/88 | HR 106 | Temp 98.9°F | Wt 294.0 lb

## 2017-05-28 DIAGNOSIS — J9611 Chronic respiratory failure with hypoxia: Secondary | ICD-10-CM | POA: Diagnosis not present

## 2017-05-28 DIAGNOSIS — R0602 Shortness of breath: Secondary | ICD-10-CM

## 2017-05-28 DIAGNOSIS — M109 Gout, unspecified: Secondary | ICD-10-CM | POA: Diagnosis not present

## 2017-05-28 DIAGNOSIS — I5032 Chronic diastolic (congestive) heart failure: Secondary | ICD-10-CM

## 2017-05-28 MED ORDER — LORATADINE 10 MG PO TABS: 10.0000 mg | ORAL_TABLET | Freq: Every day | ORAL | 0 refills | Status: DC

## 2017-05-28 MED ORDER — MELOXICAM 7.5 MG PO TABS: 7.5000 mg | ORAL_TABLET | Freq: Every day | ORAL | 0 refills | Status: DC

## 2017-05-28 NOTE — Assessment & Plan Note (Signed)
Allergic Rxn - tongue swelling and "shallow breathing" to tramadol taken this AM. Vitals stable, physical exam benign  Stop Tramadol  As patient seems to be improved no need to go to ED, if worsens discussed calling EMS Positive response to [place] - Will give Loratadine as longer acting and less side effects  Follow up as needed

## 2017-05-28 NOTE — Patient Instructions (Addendum)
Please stop taking Tramadol. Please continue taking Claritin for 3 days. Please follow up for two weeks. As discussed if any issues call EMS immediately

## 2017-05-28 NOTE — Assessment & Plan Note (Addendum)
Last seen with concern for acute exacerbation Now improved, back to dry weight Continue regiment of torsemide 80 mg twice daily and metolazone  Follow up in two weeks

## 2017-05-28 NOTE — Progress Notes (Signed)
   [name] [name], MD Phone: [phone]  Reason For Visit: Follow up for CHF  #CHF - Patient is currently back on her Demadex. Patient was on lasix for about two weeks approximately.  She was last seen in my office with increased shortness of breath and swelling in her abdomen.  She had been off her diuretic for a couple of days.  She has now been doing very well.  She denies any issues associated with her heart failure.  #Allergic Rxn/SOB Patient states she took Tramadol prescribed by Rheumatology and had some swelling of her tongue and feeling SOB earlier this morning around 9 AM. Patient denies any nausea or vomiting. Patient states she took some benadryl and that helped. She believes the tongue swelling has decreased. She still feels slightly SOB but significantly improved from this morning   Past Medical History Reviewed problem list.  Medications- reviewed and updated No additions to family history Social history- patient is a non- smoker  Objective: BP 130/88 (BP Location: Right Arm, Patient Position: Sitting, Cuff Size: Large)   Pulse (!) 106   Temp 98.9 F (37.2 C) (Oral)   Wt 294 lb (133.4 kg)   SpO2 96%   BMI 50.46 kg/m  Gen: NAD, alert, cooperative with exam HEENT: Normal    Neck: No masses palpated. No lymphadenopathy    Nose: nasal turbinates moist    Throat: moist mucus membranes, no erythema Cardio: regular rate and rhythm, S1S2 heard, no murmurs appreciated Pulm: clear to auscultation bilaterally, no wheezes, rhonchi or rales Skin: dry, intact, no rashes or lesions  Assessment/Plan: See problem based a/p  Chronic diastolic congestive heart failure (HCC) Last seen with concern for acute exacerbation Now improved, back to dry weight Continue regiment of torsemide 80 mg twice daily and metolazone  Follow up in two weeks   SOB (shortness of breath) Allergic Rxn - tongue swelling and "shallow breathing" to tramadol taken  this AM. Vitals stable, physical exam benign  Stop Tramadol  As patient seems to be improved no need to go to ED, if worsens discussed calling EMS Positive response to [place] - Will give Loratadine as longer acting and less side effects  Follow up as needed

## 2017-05-29 DIAGNOSIS — J9611 Chronic respiratory failure with hypoxia: Secondary | ICD-10-CM | POA: Diagnosis not present

## 2017-05-30 DIAGNOSIS — J9611 Chronic respiratory failure with hypoxia: Secondary | ICD-10-CM | POA: Diagnosis not present

## 2017-05-31 ENCOUNTER — Ambulatory Visit (INDEPENDENT_AMBULATORY_CARE_PROVIDER_SITE_OTHER): Payer: Medicare HMO | Admitting: Licensed Clinical Social Worker

## 2017-05-31 DIAGNOSIS — F333 Major depressive disorder, recurrent, severe with psychotic symptoms: Secondary | ICD-10-CM | POA: Diagnosis not present

## 2017-05-31 DIAGNOSIS — F3341 Major depressive disorder, recurrent, in partial remission: Secondary | ICD-10-CM

## 2017-05-31 DIAGNOSIS — F5081 Binge eating disorder: Secondary | ICD-10-CM | POA: Diagnosis not present

## 2017-05-31 DIAGNOSIS — F411 Generalized anxiety disorder: Secondary | ICD-10-CM | POA: Diagnosis not present

## 2017-05-31 DIAGNOSIS — J9611 Chronic respiratory failure with hypoxia: Secondary | ICD-10-CM | POA: Diagnosis not present

## 2017-06-01 DIAGNOSIS — R0602 Shortness of breath: Secondary | ICD-10-CM | POA: Diagnosis not present

## 2017-06-01 DIAGNOSIS — J45909 Unspecified asthma, uncomplicated: Secondary | ICD-10-CM | POA: Diagnosis not present

## 2017-06-01 DIAGNOSIS — J440 Chronic obstructive pulmonary disease with acute lower respiratory infection: Secondary | ICD-10-CM | POA: Diagnosis not present

## 2017-06-01 DIAGNOSIS — J449 Chronic obstructive pulmonary disease, unspecified: Secondary | ICD-10-CM | POA: Diagnosis not present

## 2017-06-01 DIAGNOSIS — G4733 Obstructive sleep apnea (adult) (pediatric): Secondary | ICD-10-CM | POA: Diagnosis not present

## 2017-06-01 DIAGNOSIS — N183 Chronic kidney disease, stage 3 (moderate): Secondary | ICD-10-CM | POA: Diagnosis not present

## 2017-06-01 DIAGNOSIS — I509 Heart failure, unspecified: Secondary | ICD-10-CM | POA: Diagnosis not present

## 2017-06-01 DIAGNOSIS — M2550 Pain in unspecified joint: Secondary | ICD-10-CM | POA: Diagnosis not present

## 2017-06-01 DIAGNOSIS — J189 Pneumonia, unspecified organism: Secondary | ICD-10-CM | POA: Diagnosis not present

## 2017-06-01 DIAGNOSIS — J9611 Chronic respiratory failure with hypoxia: Secondary | ICD-10-CM | POA: Diagnosis not present

## 2017-06-01 DIAGNOSIS — M6281 Muscle weakness (generalized): Secondary | ICD-10-CM | POA: Diagnosis not present

## 2017-06-01 DIAGNOSIS — R062 Wheezing: Secondary | ICD-10-CM | POA: Diagnosis not present

## 2017-06-01 DIAGNOSIS — M1A09X0 Idiopathic chronic gout, multiple sites, without tophus (tophi): Secondary | ICD-10-CM | POA: Diagnosis not present

## 2017-06-02 DIAGNOSIS — J9611 Chronic respiratory failure with hypoxia: Secondary | ICD-10-CM | POA: Diagnosis not present

## 2017-06-03 DIAGNOSIS — J9611 Chronic respiratory failure with hypoxia: Secondary | ICD-10-CM | POA: Diagnosis not present

## 2017-06-04 ENCOUNTER — Ambulatory Visit: Payer: Self-pay

## 2017-06-04 DIAGNOSIS — F064 Anxiety disorder due to known physiological condition: Secondary | ICD-10-CM | POA: Diagnosis not present

## 2017-06-04 DIAGNOSIS — J9611 Chronic respiratory failure with hypoxia: Secondary | ICD-10-CM | POA: Diagnosis not present

## 2017-06-04 DIAGNOSIS — F331 Major depressive disorder, recurrent, moderate: Secondary | ICD-10-CM | POA: Diagnosis not present

## 2017-06-05 ENCOUNTER — Other Ambulatory Visit: Payer: Self-pay

## 2017-06-05 DIAGNOSIS — M109 Gout, unspecified: Secondary | ICD-10-CM

## 2017-06-05 DIAGNOSIS — J45909 Unspecified asthma, uncomplicated: Secondary | ICD-10-CM

## 2017-06-05 DIAGNOSIS — I5032 Chronic diastolic (congestive) heart failure: Secondary | ICD-10-CM

## 2017-06-05 DIAGNOSIS — M1990 Unspecified osteoarthritis, unspecified site: Secondary | ICD-10-CM

## 2017-06-05 DIAGNOSIS — J3489 Other specified disorders of nose and nasal sinuses: Secondary | ICD-10-CM

## 2017-06-05 DIAGNOSIS — M19049 Primary osteoarthritis, unspecified hand: Secondary | ICD-10-CM

## 2017-06-05 DIAGNOSIS — R0602 Shortness of breath: Secondary | ICD-10-CM

## 2017-06-05 DIAGNOSIS — R0981 Nasal congestion: Secondary | ICD-10-CM

## 2017-06-05 DIAGNOSIS — E785 Hyperlipidemia, unspecified: Secondary | ICD-10-CM

## 2017-06-05 DIAGNOSIS — E1142 Type 2 diabetes mellitus with diabetic polyneuropathy: Secondary | ICD-10-CM

## 2017-06-05 DIAGNOSIS — D649 Anemia, unspecified: Secondary | ICD-10-CM

## 2017-06-05 DIAGNOSIS — J9611 Chronic respiratory failure with hypoxia: Secondary | ICD-10-CM | POA: Diagnosis not present

## 2017-06-05 NOTE — Telephone Encounter (Signed)
[facility] called and needs new prescriptions for the attached. Patient transferring from [facility] which closed and the prescriptions were not sent from there first.  [facility] pharmacist: [phone]  Danley Danker, RN [facility] RN)

## 2017-06-05 NOTE — Progress Notes (Signed)
Opened in error

## 2017-06-06 DIAGNOSIS — J9611 Chronic respiratory failure with hypoxia: Secondary | ICD-10-CM | POA: Diagnosis not present

## 2017-06-06 MED ORDER — ACETAMINOPHEN 500 MG PO TABS
500.0000 mg | ORAL_TABLET | Freq: Four times a day (QID) | ORAL | 0 refills | Status: DC | PRN
Start: 2017-06-06 — End: 2017-07-23

## 2017-06-06 MED ORDER — FLUTICASONE FUROATE-VILANTEROL 100-25 MCG/INH IN AEPB: INHALATION_SPRAY | RESPIRATORY_TRACT | 2 refills | Status: DC

## 2017-06-06 MED ORDER — FLUNISOLIDE 25 MCG/ACT (0.025%) NA SOLN: 2.0000 | Freq: Every day | NASAL | 2 refills | Status: DC

## 2017-06-06 MED ORDER — OMEPRAZOLE 40 MG PO CPDR: 40.0000 mg | DELAYED_RELEASE_CAPSULE | Freq: Every day | ORAL | 2 refills | Status: DC

## 2017-06-06 MED ORDER — TORSEMIDE 20 MG PO TABS: 80.0000 mg | ORAL_TABLET | Freq: Two times a day (BID) | ORAL | 4 refills | Status: DC

## 2017-06-06 MED ORDER — ALBUTEROL SULFATE (2.5 MG/3ML) 0.083% IN NEBU: 2.5000 mg | INHALATION_SOLUTION | Freq: Four times a day (QID) | RESPIRATORY_TRACT | 1 refills | Status: DC | PRN

## 2017-06-06 MED ORDER — ASPIRIN 81 MG PO TBEC: 81.0000 mg | DELAYED_RELEASE_TABLET | Freq: Every day | ORAL | 0 refills | Status: DC

## 2017-06-06 MED ORDER — SALINE SPRAY 0.65 % NA SOLN: 2.0000 | Freq: Four times a day (QID) | NASAL | 0 refills | Status: DC

## 2017-06-06 MED ORDER — METFORMIN HCL ER 500 MG PO TB24: 500.0000 mg | ORAL_TABLET | Freq: Two times a day (BID) | ORAL | 2 refills | Status: DC

## 2017-06-06 MED ORDER — ATORVASTATIN CALCIUM 80 MG PO TABS: 80.0000 mg | ORAL_TABLET | Freq: Every day | ORAL | 3 refills | Status: DC

## 2017-06-06 MED ORDER — MONTELUKAST SODIUM 10 MG PO TABS: 10.0000 mg | ORAL_TABLET | Freq: Every day | ORAL | 1 refills | Status: DC

## 2017-06-06 MED ORDER — GABAPENTIN 600 MG PO TABS: 600.0000 mg | ORAL_TABLET | Freq: Three times a day (TID) | ORAL | 0 refills | Status: DC

## 2017-06-06 MED ORDER — LORATADINE 10 MG PO TABS: 10.0000 mg | ORAL_TABLET | Freq: Every day | ORAL | 0 refills | Status: DC

## 2017-06-06 MED ORDER — FERROUS SULFATE 325 (65 FE) MG PO TABS: 325.0000 mg | ORAL_TABLET | Freq: Every day | ORAL | 2 refills | Status: DC

## 2017-06-06 MED ORDER — MELOXICAM 7.5 MG PO TABS: 7.5000 mg | ORAL_TABLET | Freq: Every day | ORAL | 0 refills | Status: DC

## 2017-06-06 MED ORDER — DICLOFENAC SODIUM 3 % TD GEL: 1.0000 "application " | Freq: Every day | TRANSDERMAL | 3 refills | Status: DC | PRN

## 2017-06-06 NOTE — Telephone Encounter (Signed)
[facility] calling again requesting Rx for pt. Wallace Cullens, RN

## 2017-06-07 DIAGNOSIS — J9611 Chronic respiratory failure with hypoxia: Secondary | ICD-10-CM | POA: Diagnosis not present

## 2017-06-08 ENCOUNTER — Encounter: Payer: Self-pay | Admitting: Internal Medicine

## 2017-06-08 ENCOUNTER — Ambulatory Visit (INDEPENDENT_AMBULATORY_CARE_PROVIDER_SITE_OTHER): Payer: Medicare HMO | Admitting: Internal Medicine

## 2017-06-08 VITALS — BP 125/80 | HR 99 | Temp 98.6°F | Wt 294.8 lb

## 2017-06-08 DIAGNOSIS — I5032 Chronic diastolic (congestive) heart failure: Secondary | ICD-10-CM | POA: Diagnosis not present

## 2017-06-08 DIAGNOSIS — I10 Essential (primary) hypertension: Secondary | ICD-10-CM

## 2017-06-08 DIAGNOSIS — E1142 Type 2 diabetes mellitus with diabetic polyneuropathy: Secondary | ICD-10-CM

## 2017-06-08 DIAGNOSIS — F3164 Bipolar disorder, current episode mixed, severe, with psychotic features: Secondary | ICD-10-CM | POA: Diagnosis not present

## 2017-06-08 DIAGNOSIS — J9611 Chronic respiratory failure with hypoxia: Secondary | ICD-10-CM | POA: Diagnosis not present

## 2017-06-08 MED ORDER — ALBUTEROL SULFATE HFA 108 (90 BASE) MCG/ACT IN AERS: 2.0000 | INHALATION_SPRAY | Freq: Four times a day (QID) | RESPIRATORY_TRACT | 0 refills | Status: DC | PRN

## 2017-06-08 MED ORDER — METFORMIN HCL ER 500 MG PO TB24: 500.0000 mg | ORAL_TABLET | Freq: Two times a day (BID) | ORAL | 2 refills | Status: DC

## 2017-06-08 MED ORDER — TRAZODONE HCL 100 MG PO TABS: 200.0000 mg | ORAL_TABLET | Freq: Every day | ORAL | Status: DC

## 2017-06-08 MED ORDER — AMLODIPINE BESYLATE 5 MG PO TABS: 5.0000 mg | ORAL_TABLET | Freq: Every day | ORAL | 0 refills | Status: DC

## 2017-06-08 MED ORDER — TIOTROPIUM BROMIDE MONOHYDRATE 18 MCG IN CAPS: 18.0000 ug | ORAL_CAPSULE | Freq: Every day | RESPIRATORY_TRACT | 1 refills | Status: DC

## 2017-06-08 MED ORDER — RISPERIDONE 1 MG PO TABS: 1.0000 mg | ORAL_TABLET | Freq: Two times a day (BID) | ORAL | Status: DC

## 2017-06-08 MED ORDER — OMEPRAZOLE 40 MG PO CPDR: 40.0000 mg | DELAYED_RELEASE_CAPSULE | Freq: Two times a day (BID) | ORAL | 2 refills | Status: DC

## 2017-06-08 NOTE — Assessment & Plan Note (Addendum)
Doing well, weight stable, no acute findings Continue torsemide and metolazone  Follow up with heart failure team at the end of this month

## 2017-06-08 NOTE — Assessment & Plan Note (Signed)
Well controlled  Continue Norvasc and Toresmide

## 2017-06-08 NOTE — Patient Instructions (Signed)
You are looking good. Please make sure to follow up in about 3 weeks.

## 2017-06-08 NOTE — Assessment & Plan Note (Addendum)
Recently had an exacerbation of her by Bipolar disorder.  She states she was hearing voices and felt homicidal. Psychiatry made significant changes to medications  - Currently on Vyvasne, Risperdal, Lexapro

## 2017-06-08 NOTE — Progress Notes (Signed)
   [name] [name], MD Phone: [phone]  Reason For Visit: Follow up   # Bipolar Disorder  Patient states that she was feeling suicidal and homicidal a couple weeks ago.  She also states that she was hearing voices.  She went to see her psychiatrist and they changed up her medications significantly.  She has stopped the Seroquel been placed on Risperdal.  [name] also been started on Vyvanse. She says that she is doing much much better.  She denies any symptoms of suicidal ideation.  She denies any hearing voices.  She denies any HI.  #CHF  Patient denies feeling short of breath.  She has minimal leg swelling.  Overall she feels like she is doing well.  Her weights have been consistent.  [name] been taking her torsemide as directed as well as her metolazone.  #CHRONIC HTN: Current Meds -Norvasc Reports good compliance, took meds today. Tolerating well, w/o complaints. Lifestyle - limiting salt  Denies CP, dyspnea, HA, edema, dizziness / lightheadedness  Past Medical History Reviewed problem list.  Medications- reviewed and updated No additions to family history Social history- patient is a non smoker  Objective: BP 125/80   Pulse 99   Temp 98.6 F (37 C)   Wt 294 lb 12.8 oz (133.7 kg)   SpO2 96%   BMI 50.60 kg/m  Gen: NAD, alert, cooperative with exam, 6 L in place Cardio: regular rate and rhythm, S1S2 heard, no murmurs appreciated Pulm: clear to auscultation bilaterally, no wheezes, rhonchi or rales Extremities: warm, well perfused, minimal edema  Assessment/Plan: See problem based a/p  Bipolar affective disorder, mixed, severe, with psychotic behavior ([place]) Recently had an exacerbation of her by Bipolar disorder.  She states she was hearing voices and felt homicidal. Psychiatry made significant changes to medications  - Currently on Vyvasne, Risperdal, Lexapro    Chronic diastolic congestive heart failure (HCC) Doing well, weight  stable, no acute findings Continue torsemide and metolazone  Follow up with heart failure team at the end of this month   Essential hypertension Well controlled  Continue Norvasc and Toresmide

## 2017-06-09 DIAGNOSIS — J440 Chronic obstructive pulmonary disease with acute lower respiratory infection: Secondary | ICD-10-CM | POA: Diagnosis not present

## 2017-06-09 DIAGNOSIS — J189 Pneumonia, unspecified organism: Secondary | ICD-10-CM | POA: Diagnosis not present

## 2017-06-09 DIAGNOSIS — M6281 Muscle weakness (generalized): Secondary | ICD-10-CM | POA: Diagnosis not present

## 2017-06-09 DIAGNOSIS — I509 Heart failure, unspecified: Secondary | ICD-10-CM | POA: Diagnosis not present

## 2017-06-09 DIAGNOSIS — J45909 Unspecified asthma, uncomplicated: Secondary | ICD-10-CM | POA: Diagnosis not present

## 2017-06-09 DIAGNOSIS — J449 Chronic obstructive pulmonary disease, unspecified: Secondary | ICD-10-CM | POA: Diagnosis not present

## 2017-06-09 DIAGNOSIS — G4733 Obstructive sleep apnea (adult) (pediatric): Secondary | ICD-10-CM | POA: Diagnosis not present

## 2017-06-09 DIAGNOSIS — R062 Wheezing: Secondary | ICD-10-CM | POA: Diagnosis not present

## 2017-06-09 DIAGNOSIS — R0602 Shortness of breath: Secondary | ICD-10-CM | POA: Diagnosis not present

## 2017-06-09 DIAGNOSIS — J9620 Acute and chronic respiratory failure, unspecified whether with hypoxia or hypercapnia: Secondary | ICD-10-CM | POA: Diagnosis not present

## 2017-06-09 DIAGNOSIS — J9611 Chronic respiratory failure with hypoxia: Secondary | ICD-10-CM | POA: Diagnosis not present

## 2017-06-10 DIAGNOSIS — J9611 Chronic respiratory failure with hypoxia: Secondary | ICD-10-CM | POA: Diagnosis not present

## 2017-06-12 ENCOUNTER — Ambulatory Visit: Payer: Medicare HMO | Admitting: Family Medicine

## 2017-06-13 ENCOUNTER — Encounter (HOSPITAL_COMMUNITY): Payer: Self-pay | Admitting: *Deleted

## 2017-06-13 ENCOUNTER — Ambulatory Visit (HOSPITAL_COMMUNITY)
Admission: RE | Admit: 2017-06-13 | Discharge: 2017-06-13 | Disposition: A | Discharge status: Home / Self Care | Payer: Medicare HMO | Source: Ambulatory Visit | Attending: Internal Medicine | Admitting: Internal Medicine

## 2017-06-13 ENCOUNTER — Telehealth: Payer: Self-pay | Admitting: Emergency Medicine

## 2017-06-13 VITALS — BP 124/86 | HR 109 | Wt 291.1 lb

## 2017-06-13 DIAGNOSIS — E1122 Type 2 diabetes mellitus with diabetic chronic kidney disease: Secondary | ICD-10-CM | POA: Insufficient documentation

## 2017-06-13 DIAGNOSIS — F419 Anxiety disorder, unspecified: Secondary | ICD-10-CM | POA: Diagnosis not present

## 2017-06-13 DIAGNOSIS — Z885 Allergy status to narcotic agent status: Secondary | ICD-10-CM | POA: Insufficient documentation

## 2017-06-13 DIAGNOSIS — N183 Chronic kidney disease, stage 3 (moderate): Secondary | ICD-10-CM | POA: Insufficient documentation

## 2017-06-13 DIAGNOSIS — Z6841 Body Mass Index (BMI) 40.0 and over, adult: Secondary | ICD-10-CM | POA: Insufficient documentation

## 2017-06-13 DIAGNOSIS — J440 Chronic obstructive pulmonary disease with acute lower respiratory infection: Secondary | ICD-10-CM | POA: Diagnosis not present

## 2017-06-13 DIAGNOSIS — J189 Pneumonia, unspecified organism: Secondary | ICD-10-CM | POA: Diagnosis not present

## 2017-06-13 DIAGNOSIS — Z9981 Dependence on supplemental oxygen: Secondary | ICD-10-CM | POA: Diagnosis not present

## 2017-06-13 DIAGNOSIS — J9611 Chronic respiratory failure with hypoxia: Secondary | ICD-10-CM | POA: Diagnosis not present

## 2017-06-13 DIAGNOSIS — G4733 Obstructive sleep apnea (adult) (pediatric): Secondary | ICD-10-CM | POA: Insufficient documentation

## 2017-06-13 DIAGNOSIS — Z79899 Other long term (current) drug therapy: Secondary | ICD-10-CM | POA: Diagnosis not present

## 2017-06-13 DIAGNOSIS — Z8674 Personal history of sudden cardiac arrest: Secondary | ICD-10-CM | POA: Insufficient documentation

## 2017-06-13 DIAGNOSIS — R062 Wheezing: Secondary | ICD-10-CM | POA: Diagnosis not present

## 2017-06-13 DIAGNOSIS — R0602 Shortness of breath: Secondary | ICD-10-CM | POA: Diagnosis not present

## 2017-06-13 DIAGNOSIS — F329 Major depressive disorder, single episode, unspecified: Secondary | ICD-10-CM | POA: Insufficient documentation

## 2017-06-13 DIAGNOSIS — K219 Gastro-esophageal reflux disease without esophagitis: Secondary | ICD-10-CM | POA: Insufficient documentation

## 2017-06-13 DIAGNOSIS — Z7982 Long term (current) use of aspirin: Secondary | ICD-10-CM | POA: Insufficient documentation

## 2017-06-13 DIAGNOSIS — J449 Chronic obstructive pulmonary disease, unspecified: Secondary | ICD-10-CM | POA: Diagnosis not present

## 2017-06-13 DIAGNOSIS — E785 Hyperlipidemia, unspecified: Secondary | ICD-10-CM | POA: Insufficient documentation

## 2017-06-13 DIAGNOSIS — Z882 Allergy status to sulfonamides status: Secondary | ICD-10-CM | POA: Insufficient documentation

## 2017-06-13 DIAGNOSIS — Z888 Allergy status to other drugs, medicaments and biological substances status: Secondary | ICD-10-CM | POA: Insufficient documentation

## 2017-06-13 DIAGNOSIS — I5032 Chronic diastolic (congestive) heart failure: Secondary | ICD-10-CM | POA: Insufficient documentation

## 2017-06-13 DIAGNOSIS — I130 Hypertensive heart and chronic kidney disease with heart failure and stage 1 through stage 4 chronic kidney disease, or unspecified chronic kidney disease: Secondary | ICD-10-CM | POA: Insufficient documentation

## 2017-06-13 DIAGNOSIS — F209 Schizophrenia, unspecified: Secondary | ICD-10-CM | POA: Insufficient documentation

## 2017-06-13 DIAGNOSIS — Z87891 Personal history of nicotine dependence: Secondary | ICD-10-CM | POA: Diagnosis not present

## 2017-06-13 DIAGNOSIS — Z8673 Personal history of transient ischemic attack (TIA), and cerebral infarction without residual deficits: Secondary | ICD-10-CM | POA: Diagnosis not present

## 2017-06-13 DIAGNOSIS — I252 Old myocardial infarction: Secondary | ICD-10-CM | POA: Diagnosis not present

## 2017-06-13 DIAGNOSIS — M6281 Muscle weakness (generalized): Secondary | ICD-10-CM | POA: Diagnosis not present

## 2017-06-13 DIAGNOSIS — Z7984 Long term (current) use of oral hypoglycemic drugs: Secondary | ICD-10-CM | POA: Diagnosis not present

## 2017-06-13 DIAGNOSIS — J45909 Unspecified asthma, uncomplicated: Secondary | ICD-10-CM | POA: Diagnosis not present

## 2017-06-13 DIAGNOSIS — I509 Heart failure, unspecified: Secondary | ICD-10-CM | POA: Diagnosis not present

## 2017-06-13 LAB — BASIC METABOLIC PANEL
Anion gap: 12 (ref 5–15)
BUN: 31 mg/dL — ABNORMAL HIGH (ref 6–20)
CO2: 31 mmol/L (ref 22–32)
Calcium: 9.7 mg/dL (ref 8.9–10.3)
Chloride: 99 mmol/L — ABNORMAL LOW (ref 101–111)
Creatinine, Ser: 2.33 mg/dL — ABNORMAL HIGH (ref 0.44–1.00)
GFR calc Af Amer: 25 mL/min — ABNORMAL LOW (ref >60)
GFR calc non Af Amer: 22 mL/min — ABNORMAL LOW (ref >60)
Glucose, Bld: 91 mg/dL (ref 65–99)
Potassium: 2.7 mmol/L — CL (ref 3.5–5.1)
Sodium: 142 mmol/L (ref 135–145)

## 2017-06-13 LAB — CBC
HCT: 36.5 % (ref 36.0–46.0)
Hemoglobin: 10.9 g/dL — ABNORMAL LOW (ref 12.0–15.0)
MCH: 28.5 pg (ref 26.0–34.0)
MCHC: 29.9 g/dL — ABNORMAL LOW (ref 30.0–36.0)
MCV: 95.3 fL (ref 78.0–100.0)
Platelets: 423 10*3/uL — ABNORMAL HIGH (ref 150–400)
RBC: 3.83 MIL/uL — ABNORMAL LOW (ref 3.87–5.11)
RDW: 16.9 % — ABNORMAL HIGH (ref 11.5–15.5)
WBC: 9.5 10*3/uL (ref 4.0–10.5)

## 2017-06-13 NOTE — Patient Instructions (Addendum)
Labs today  You have been referred to Pulmonary Rehab, they will call you to schedule  Heart Catheterization, see instruction sheet  We will contact you in 6 months to schedule your next appointment.

## 2017-06-13 NOTE — Telephone Encounter (Signed)
Called and spoke with pt.  Dr. Agustina Caroli first avail is not until 6/27.  Schedule ROV for pt with RB 6/27 at 2:45.  Stated to pt she can call back periodically to see if we have had any cancellations and if so, we can change her appt with RB sooner if can do so.  Nothing further needed at this time.

## 2017-06-13 NOTE — Telephone Encounter (Signed)
Pt called to report that she was seen in [facility] today. I have reviewed the notes. [name] having progressive dyspnea. Has been compliant with NIPPV, O2, diuretics. She has a cath planned for next week. I encouraged her to continue with our current plans, go to pulm rehab as arranged by Dr Haroldine Laws. Unclear that there was anything for me to add today. I supported the plan.   Please arrange ROV with me next available.

## 2017-06-13 NOTE — Progress Notes (Signed)
Advanced Heart Failure Clinic Note   Primary Care: Asiyah Cletis Media, MD         Primary Cardiologist: [name] Pulmonary: Dr. Lamonte Sakai  HPI:  Tiffany [name] is a 57 y.o. female with a hx of asthma, COPD on chronic supplemental O2, chronic diastolic CHF, prior MI, HTN, hyperlipidemia, type 2 diabetes, history of cocaine abuse, CKD stage II,anemiaand anxiety.   Last seen in [facility] office 11/09/16. Visit notable for marked dietary non-compliance and fluid retention.   Seen by PCP 12/01/16 and Metolazone cut back with AKI.   She presents today for regular follow up. Seen in ED 05/12/17 with COPD/CHF in the setting of running out of her diuretics for 3 days. Given IV lasix with improvement. She feels like she is more SOB. Her O2 sats drop into the upper 80s with activity.  Has an electric wheelchair at home now. She has continued orthopnea. Denies palpitations. Denies lightheadedness. Does have a "tightness" in her chest when she is SOB. Occasional vertigo symptoms with "room spinning". Has an aide who is "baking" her meals. Avoiding fast food. Says she los 9 pounds.   Echo 10/30/16 with LVEF 60-65%, mild LAE.   Review of systems complete and found to be negative unless listed in HPI.    Past Medical History:  Diagnosis Date  . Anxiety   . Arthritis    "all over" (04/10/2016)  . Asthma   . Cardiac arrest ([place]) 09/08/2016   PEA  . Carotid artery stenosis    1-39% bilateral by dopplers 11/2016  . Chronic bronchitis ([place])   . Chronic diastolic (congestive) heart failure ([place])   . Chronic kidney disease    "I see a kidney dr." (04/10/2016)  . Cocaine abuse ([place])   . Complication of anesthesia    decreased bp, decreased heart rate  . Depression   . Disorder of nervous system   . Emphysema   . GERD (gastroesophageal reflux disease)   . Heart attack ([place]) 1980s  . History of blood transfusion 1994   "couldn't stop bleeding from my period"  . Hyperlipidemia LDL goal <70   .  Hypertension   . Incontinence   . Manic depression ([place])   . On home oxygen therapy    "6L; 24/7" (04/10/2016)  . OSA on CPAP    "wear mask sometimes" (04/10/2016)  . Paranoid ([place])    "sometimes; I'm on RX for it" (04/10/2016)  . Pneumonia    "I've had it several times; haven't had it since 06/2015" (04/10/2016)  . Schizophrenia ([place])   . Seasonal allergies   . Seizures ([place])    "don't know what kind; last one was ~ 1 yr ago" (04/10/2016)  . Sinus trouble   . Stroke [facility]) 1980s   denies residual on 04/10/2016  . Type II diabetes mellitus ([place])     Current Outpatient Medications  Medication Sig Dispense Refill  . acetaminophen (TYLENOL) 500 MG tablet Take 1 tablet (500 mg total) by mouth every 6 (six) hours as needed. 60 tablet 0  . albuterol (PROVENTIL HFA;VENTOLIN HFA) 108 (90 Base) MCG/ACT inhaler Inhale 2 puffs into the lungs every 6 (six) hours as needed for wheezing or shortness of breath. 18 Inhaler 0  . albuterol (PROVENTIL) (2.5 MG/3ML) 0.083% nebulizer solution Take 3 mLs (2.5 mg total) by nebulization every 6 (six) hours as needed for wheezing or shortness of breath. 150 mL 1  . allopurinol (ZYLOPRIM) 100 MG tablet Take 100 mg by mouth 2 (two)  times daily.  3  . amLODipine (NORVASC) 5 MG tablet Take 1 tablet (5 mg total) by mouth daily. 90 tablet 0  . aspirin 81 MG EC tablet Take 1 tablet (81 mg total) by mouth daily. Swallow whole. 90 tablet 0  . atorvastatin (LIPITOR) 80 MG tablet Take 1 tablet (80 mg total) by mouth daily. 90 tablet 3  . chlorhexidine (PERIDEX) 0.12 % solution Use as directed 15 mLs in the mouth or throat See admin instructions. Swish 1/2 oz (15 mls) for 30 seconds and then spit - twice daily  0  . Diclofenac Sodium 3 % GEL Apply 1 application topically daily as needed (pain). Use on area of the pain 1 Tube 3  . escitalopram (LEXAPRO) 20 MG tablet Take 1 tablet (20 mg total) by mouth daily. 90 tablet 0  . ferrous sulfate 325 (65 FE) MG tablet Take 1 tablet  (325 mg total) by mouth daily. 30 tablet 2  . flunisolide (NASALIDE) 25 MCG/ACT (0.025%) SOLN Place 2 sprays into the nose daily. 1 Bottle 2  . fluticasone furoate-vilanterol (BREO ELLIPTA) 100-25 MCG/INH AEPB INHALE 1 PUFF EVERY DAY 60 each 2  . gabapentin (NEURONTIN) 600 MG tablet Take 1 tablet (600 mg total) by mouth 3 (three) times daily. 270 tablet 0  . lisdexamfetamine (VYVANSE) 40 MG capsule Take 40 mg by mouth daily.    [name] loratadine (CLARITIN) 10 MG tablet Take 1 tablet (10 mg total) by mouth daily. 20 tablet 0  . meloxicam (MOBIC) 7.5 MG tablet Take 1 tablet (7.5 mg total) by mouth daily. 20 tablet 0  . metFORMIN (GLUCOPHAGE-XR) 500 MG 24 hr tablet Take 1 tablet (500 mg total) by mouth 2 (two) times daily. 180 tablet 2  . metolazone (ZAROXOLYN) 2.5 MG tablet Take 1 tablet (2.5 mg total) by mouth every Monday, Wednesday, and Friday. Take 30 mins prior to taking Torsemide 3 tablet 0  . montelukast (SINGULAIR) 10 MG tablet Take 1 tablet (10 mg total) by mouth at bedtime. 90 tablet 1  . Multiple Vitamin (MULTIVITAMIN WITH MINERALS) TABS tablet Take 1 tablet by mouth daily.    [name] omeprazole (PRILOSEC) 40 MG capsule Take 1 capsule (40 mg total) by mouth 2 (two) times daily. 120 capsule 2  . OXYGEN Inhale 6 L into the lungs continuous.     . potassium chloride (K-DUR) 10 MEQ tablet Take 4 tablets (40 mEq total) by mouth 3 (three) times daily. May also take 4 tablets (40 mEq total) at bedtime as needed ([place]tih Metolazone on M/W/F). 450 tablet 6  . sodium chloride (OCEAN) 0.65 % SOLN nasal spray Place 2 sprays into both nostrils 4 (four) times daily. 30 mL 0  . tiotropium (SPIRIVA HANDIHALER) 18 MCG inhalation capsule Place 1 capsule (18 mcg total) into inhaler and inhale daily. 90 capsule 1  . torsemide (DEMADEX) 20 MG tablet Take 4 tablets (80 mg total) by mouth 2 (two) times daily. 240 tablet 4  . traZODone (DESYREL) 100 MG tablet Take 2 tablets (200 mg total) by mouth at bedtime.     No current  facility-administered medications for this encounter.    Allergies  Allergen Reactions  . Hydrocodone-Acetaminophen Shortness Of Breath  . Hydroxyzine Anaphylaxis and Shortness Of Breath  . Latuda [Lurasidone Hcl] Anaphylaxis  . Tramadol Anaphylaxis  . Codeine Nausea And Vomiting  . Sulfa Antibiotics Itching  . Tape Rash   Social History   Socioeconomic History  . Marital status: Widowed    Spouse name:  Not on file  . Number of children: 3  . Years of education: Not on file  . Highest education level: Not on file  Occupational History  . 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 Financial resource strain: Not on file  . Food insecurity:    Worry: Not on file    Inability: Not on file  . Transportation needs:    Medical: Not on file    Non-medical: Not on file  Tobacco Use  . Smoking status: Former Smoker    Packs/day: 1.50    Years: 38.00    Pack years: 57.00    Types: Cigarettes    Start date: 03/13/1977    Last attempt to quit: 04/10/2016    Years since quitting: 1.1  . Smokeless tobacco: Never Used  Substance and Sexual Activity  . Alcohol use: No    Alcohol/week: 0.0 oz  . Drug use: No    Types: Cocaine    Comment: 04/10/2016 "last used cocaine back in November 2017"  . Sexual activity: Never    Birth control/protection: Surgical  Lifestyle  . Physical activity:    Days per week: Not on file    Minutes per session: Not on file  . Stress: Not on file  Relationships  . Social connections:    Talks on phone: Not on file    Gets together: Not on file    Attends religious service: Not on file    Active member of club or organization: Not on file    Attends meetings of clubs or organizations: Not on file    Relationship status: Not on file  . Intimate partner violence:    Fear of current or ex partner: Not on file    Emotionally abused: Not on file    Physically abused: Not on file    Forced sexual activity: Not on file  Other  Topics Concern  . Not on file  Social History Narrative   Has 1 son, [name]   Lives with son and his boyfriend   Her house has ramps and handrails should she ever needs them.    Her mother lives down the street from her and is a good support person in addition to her son.   She drives herself, has private transportation.    Cocaine free since 02/24/16, smoke free since 04/10/16   Family History  Problem Relation Age of Onset  . Cancer Father        prostate  . Cancer Mother        lung  . Depression Mother   . Depression Sister   . Anxiety disorder Sister   . Schizophrenia Sister   . Bipolar disorder Sister   . Depression Sister   . Depression Brother   . Heart failure Unknown        cousin   Vitals:   06/13/17 1524  BP: 124/86  Pulse: (!) 109  SpO2: 96%  Weight: 291 lb 1.9 oz (132.1 kg)   Wt Readings from Last 3 Encounters:  06/13/17 291 lb 1.9 oz (132.1 kg)  06/08/17 294 lb 12.8 oz (133.7 kg)  05/28/17 294 lb (133.4 kg)    PHYSICAL EXAM: General: Obese, NAD. Arrived in [facility]. O2 via [place].  HEENT: Normal anicteric edentulous Neck: Supple. Very thick  JVP difficult due to body habitus. Carotids 2+ bilat; no bruits. No thyromegaly or nodule noted. Cor: PMI nondisplaced. RRR, No M/G/R noted Lungs: CTAB, normal effort. Abdomen:  Markedly obese Soft, non-tender, non-distended, no HSM. No bruits or masses. +BS  Extremities: No cyanosis, clubbing, or rash. R and LLE trace edema.  Neuro: Alert & orientedx3, cranial nerves grossly intact. moves all 4 extremities w/o difficulty. Affect pleasant   ASSESSMENT & PLAN:  1. Chronic diastolic CHF - Echo 62/2297 LVEF 60-65% - NYHA III-IIIB, confounded by severe COPD.  - Volume status stable to mildly elevated.  Will schedule RHC - Continue torsemide 80 mg BID - Continue metolazone 2.5 mg M-W-F. Have previously encouraged patient to limit to twice weekly, but she refuses.  - Reinforced fluid restriction to < 2 L daily, sodium  restriction to less than 2000 mg daily, and the importance of daily weights.    2. HTN - Stable on current meds.  - Continue amlodipine 5 mg daily.  3. COPD on chronic home O2 - O2 has increased recently. Follows with Dr. Lamonte Sakai.  - Continue chronic O2 - Consider pulmonary rehab.   4. Morbid obesity - Body mass index is 49.97 kg/m.  - Encouraged weight loss.   5. CKD stage III - BMET today.   6. DM2 - Per PCP.   7. R carotid bruit - 1-39% bilateral by dopplers 11/2016.  - Per. [name]. No change.   Shirley Friar, PA-C 06/13/17   Patient seen and examined with the above-signed Advanced Practice Provider and/or Housestaff. I personally reviewed laboratory data, imaging studies and relevant notes. I independently examined the patient and formulated the important aspects of the plan. I have edited the note to reflect any of my changes or salient points. I have personally discussed the plan with the patient and/or family.  She remains markedly SOB. C/o orthopnea and occasional chest tightness. Suspect mostly related to here severe obesity and OHS. Following with Dr. Lamonte Sakai. Given Chest tightness and orthopnea with proceed with R/L to see if we can optimize her further from cardiac perspective. Stressed need for further weight loss. Will refer to Pulmonary Rehab.   Glori Bickers, MD  4:03 PM

## 2017-06-13 NOTE — Addendum Note (Signed)
Encounter addended by: Scarlette Calico, RN on: 06/13/2017 4:29 PM  Actions taken: Order list changed, Diagnosis association updated, Sign clinical note

## 2017-06-13 NOTE — H&P (View-Only) (Signed)
Advanced Heart Failure Clinic Note   Primary Care: Asiyah Cletis Media, MD         Primary Cardiologist: [name] Pulmonary: Dr. Lamonte Sakai  HPI:  Tiffany [name] is a 57 y.o. female with a hx of asthma, COPD on chronic supplemental O2, chronic diastolic CHF, prior MI, HTN, hyperlipidemia, type 2 diabetes, history of cocaine abuse, CKD stage II,anemiaand anxiety.   Last seen in [facility] office 11/09/16. Visit notable for marked dietary non-compliance and fluid retention.   Seen by PCP 12/01/16 and Metolazone cut back with AKI.   She presents today for regular follow up. Seen in ED 05/12/17 with COPD/CHF in the setting of running out of her diuretics for 3 days. Given IV lasix with improvement. She feels like she is more SOB. Her O2 sats drop into the upper 80s with activity.  Has an electric wheelchair at home now. She has continued orthopnea. Denies palpitations. Denies lightheadedness. Does have a "tightness" in her chest when she is SOB. Occasional vertigo symptoms with "room spinning". Has an aide who is "baking" her meals. Avoiding fast food. Says she los 9 pounds.   Echo 10/30/16 with LVEF 60-65%, mild LAE.   Review of systems complete and found to be negative unless listed in HPI.    Past Medical History:  Diagnosis Date  . Anxiety   . Arthritis    "all over" (04/10/2016)  . Asthma   . Cardiac arrest ([place]) 09/08/2016   PEA  . Carotid artery stenosis    1-39% bilateral by dopplers 11/2016  . Chronic bronchitis ([place])   . Chronic diastolic (congestive) heart failure ([place])   . Chronic kidney disease    "I see a kidney dr." (04/10/2016)  . Cocaine abuse ([place])   . Complication of anesthesia    decreased bp, decreased heart rate  . Depression   . Disorder of nervous system   . Emphysema   . GERD (gastroesophageal reflux disease)   . Heart attack ([place]) 1980s  . History of blood transfusion 1994   "couldn't stop bleeding from my period"  . Hyperlipidemia LDL goal <70   .  Hypertension   . Incontinence   . Manic depression ([place])   . On home oxygen therapy    "6L; 24/7" (04/10/2016)  . OSA on CPAP    "wear mask sometimes" (04/10/2016)  . Paranoid ([place])    "sometimes; I'm on RX for it" (04/10/2016)  . Pneumonia    "I've had it several times; haven't had it since 06/2015" (04/10/2016)  . Schizophrenia ([place])   . Seasonal allergies   . Seizures ([place])    "don't know what kind; last one was ~ 1 yr ago" (04/10/2016)  . Sinus trouble   . Stroke [facility]) 1980s   denies residual on 04/10/2016  . Type II diabetes mellitus ([place])     Current Outpatient Medications  Medication Sig Dispense Refill  . acetaminophen (TYLENOL) 500 MG tablet Take 1 tablet (500 mg total) by mouth every 6 (six) hours as needed. 60 tablet 0  . albuterol (PROVENTIL HFA;VENTOLIN HFA) 108 (90 Base) MCG/ACT inhaler Inhale 2 puffs into the lungs every 6 (six) hours as needed for wheezing or shortness of breath. 18 Inhaler 0  . albuterol (PROVENTIL) (2.5 MG/3ML) 0.083% nebulizer solution Take 3 mLs (2.5 mg total) by nebulization every 6 (six) hours as needed for wheezing or shortness of breath. 150 mL 1  . allopurinol (ZYLOPRIM) 100 MG tablet Take 100 mg by mouth 2 (two)  times daily.  3  . amLODipine (NORVASC) 5 MG tablet Take 1 tablet (5 mg total) by mouth daily. 90 tablet 0  . aspirin 81 MG EC tablet Take 1 tablet (81 mg total) by mouth daily. Swallow whole. 90 tablet 0  . atorvastatin (LIPITOR) 80 MG tablet Take 1 tablet (80 mg total) by mouth daily. 90 tablet 3  . chlorhexidine (PERIDEX) 0.12 % solution Use as directed 15 mLs in the mouth or throat See admin instructions. Swish 1/2 oz (15 mls) for 30 seconds and then spit - twice daily  0  . Diclofenac Sodium 3 % GEL Apply 1 application topically daily as needed (pain). Use on area of the pain 1 Tube 3  . escitalopram (LEXAPRO) 20 MG tablet Take 1 tablet (20 mg total) by mouth daily. 90 tablet 0  . ferrous sulfate 325 (65 FE) MG tablet Take 1 tablet  (325 mg total) by mouth daily. 30 tablet 2  . flunisolide (NASALIDE) 25 MCG/ACT (0.025%) SOLN Place 2 sprays into the nose daily. 1 Bottle 2  . fluticasone furoate-vilanterol (BREO ELLIPTA) 100-25 MCG/INH AEPB INHALE 1 PUFF EVERY DAY 60 each 2  . gabapentin (NEURONTIN) 600 MG tablet Take 1 tablet (600 mg total) by mouth 3 (three) times daily. 270 tablet 0  . lisdexamfetamine (VYVANSE) 40 MG capsule Take 40 mg by mouth daily.    [name] loratadine (CLARITIN) 10 MG tablet Take 1 tablet (10 mg total) by mouth daily. 20 tablet 0  . meloxicam (MOBIC) 7.5 MG tablet Take 1 tablet (7.5 mg total) by mouth daily. 20 tablet 0  . metFORMIN (GLUCOPHAGE-XR) 500 MG 24 hr tablet Take 1 tablet (500 mg total) by mouth 2 (two) times daily. 180 tablet 2  . metolazone (ZAROXOLYN) 2.5 MG tablet Take 1 tablet (2.5 mg total) by mouth every Monday, Wednesday, and Friday. Take 30 mins prior to taking Torsemide 3 tablet 0  . montelukast (SINGULAIR) 10 MG tablet Take 1 tablet (10 mg total) by mouth at bedtime. 90 tablet 1  . Multiple Vitamin (MULTIVITAMIN WITH MINERALS) TABS tablet Take 1 tablet by mouth daily.    [name] omeprazole (PRILOSEC) 40 MG capsule Take 1 capsule (40 mg total) by mouth 2 (two) times daily. 120 capsule 2  . OXYGEN Inhale 6 L into the lungs continuous.     . potassium chloride (K-DUR) 10 MEQ tablet Take 4 tablets (40 mEq total) by mouth 3 (three) times daily. May also take 4 tablets (40 mEq total) at bedtime as needed ([place]tih Metolazone on M/W/F). 450 tablet 6  . sodium chloride (OCEAN) 0.65 % SOLN nasal spray Place 2 sprays into both nostrils 4 (four) times daily. 30 mL 0  . tiotropium (SPIRIVA HANDIHALER) 18 MCG inhalation capsule Place 1 capsule (18 mcg total) into inhaler and inhale daily. 90 capsule 1  . torsemide (DEMADEX) 20 MG tablet Take 4 tablets (80 mg total) by mouth 2 (two) times daily. 240 tablet 4  . traZODone (DESYREL) 100 MG tablet Take 2 tablets (200 mg total) by mouth at bedtime.     No current  facility-administered medications for this encounter.    Allergies  Allergen Reactions  . Hydrocodone-Acetaminophen Shortness Of Breath  . Hydroxyzine Anaphylaxis and Shortness Of Breath  . Latuda [Lurasidone Hcl] Anaphylaxis  . Tramadol Anaphylaxis  . Codeine Nausea And Vomiting  . Sulfa Antibiotics Itching  . Tape Rash   Social History   Socioeconomic History  . Marital status: Widowed    Spouse name:  Not on file  . Number of children: 3  . Years of education: Not on file  . Highest education level: Not on file  Occupational History  . 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 Financial resource strain: Not on file  . Food insecurity:    Worry: Not on file    Inability: Not on file  . Transportation needs:    Medical: Not on file    Non-medical: Not on file  Tobacco Use  . Smoking status: Former Smoker    Packs/day: 1.50    Years: 38.00    Pack years: 57.00    Types: Cigarettes    Start date: 03/13/1977    Last attempt to quit: 04/10/2016    Years since quitting: 1.1  . Smokeless tobacco: Never Used  Substance and Sexual Activity  . Alcohol use: No    Alcohol/week: 0.0 oz  . Drug use: No    Types: Cocaine    Comment: 04/10/2016 "last used cocaine back in November 2017"  . Sexual activity: Never    Birth control/protection: Surgical  Lifestyle  . Physical activity:    Days per week: Not on file    Minutes per session: Not on file  . Stress: Not on file  Relationships  . Social connections:    Talks on phone: Not on file    Gets together: Not on file    Attends religious service: Not on file    Active member of club or organization: Not on file    Attends meetings of clubs or organizations: Not on file    Relationship status: Not on file  . Intimate partner violence:    Fear of current or ex partner: Not on file    Emotionally abused: Not on file    Physically abused: Not on file    Forced sexual activity: Not on file  Other  Topics Concern  . Not on file  Social History Narrative   Has 1 son, [name]   Lives with son and his boyfriend   Her house has ramps and handrails should she ever needs them.    Her mother lives down the street from her and is a good support person in addition to her son.   She drives herself, has private transportation.    Cocaine free since 02/24/16, smoke free since 04/10/16   Family History  Problem Relation Age of Onset  . Cancer Father        prostate  . Cancer Mother        lung  . Depression Mother   . Depression Sister   . Anxiety disorder Sister   . Schizophrenia Sister   . Bipolar disorder Sister   . Depression Sister   . Depression Brother   . Heart failure Unknown        cousin   Vitals:   06/13/17 1524  BP: 124/86  Pulse: (!) 109  SpO2: 96%  Weight: 291 lb 1.9 oz (132.1 kg)   Wt Readings from Last 3 Encounters:  06/13/17 291 lb 1.9 oz (132.1 kg)  06/08/17 294 lb 12.8 oz (133.7 kg)  05/28/17 294 lb (133.4 kg)    PHYSICAL EXAM: General: Obese, NAD. Arrived in [facility]. O2 via [place].  HEENT: Normal anicteric edentulous Neck: Supple. Very thick  JVP difficult due to body habitus. Carotids 2+ bilat; no bruits. No thyromegaly or nodule noted. Cor: PMI nondisplaced. RRR, No M/G/R noted Lungs: CTAB, normal effort. Abdomen:  Markedly obese Soft, non-tender, non-distended, no HSM. No bruits or masses. +BS  Extremities: No cyanosis, clubbing, or rash. R and LLE trace edema.  Neuro: Alert & orientedx3, cranial nerves grossly intact. moves all 4 extremities w/o difficulty. Affect pleasant   ASSESSMENT & PLAN:  1. Chronic diastolic CHF - Echo 09/3816 LVEF 60-65% - NYHA III-IIIB, confounded by severe COPD.  - Volume status stable to mildly elevated.  Will schedule RHC - Continue torsemide 80 mg BID - Continue metolazone 2.5 mg M-W-F. Have previously encouraged patient to limit to twice weekly, but she refuses.  - Reinforced fluid restriction to < 2 L daily, sodium  restriction to less than 2000 mg daily, and the importance of daily weights.    2. HTN - Stable on current meds.  - Continue amlodipine 5 mg daily.  3. COPD on chronic home O2 - O2 has increased recently. Follows with Dr. Lamonte Sakai.  - Continue chronic O2 - Consider pulmonary rehab.   4. Morbid obesity - Body mass index is 49.97 kg/m.  - Encouraged weight loss.   5. CKD stage III - BMET today.   6. DM2 - Per PCP.   7. R carotid bruit - 1-39% bilateral by dopplers 11/2016.  - Per. [name]. No change.   Shirley Friar, PA-C 06/13/17   Patient seen and examined with the above-signed Advanced Practice Provider and/or Housestaff. I personally reviewed laboratory data, imaging studies and relevant notes. I independently examined the patient and formulated the important aspects of the plan. I have edited the note to reflect any of my changes or salient points. I have personally discussed the plan with the patient and/or family.  She remains markedly SOB. C/o orthopnea and occasional chest tightness. Suspect mostly related to here severe obesity and OHS. Following with Dr. Lamonte Sakai. Given Chest tightness and orthopnea with proceed with R/L to see if we can optimize her further from cardiac perspective. Stressed need for further weight loss. Will refer to Pulmonary Rehab.   Glori Bickers, MD  4:03 PM

## 2017-06-14 ENCOUNTER — Telehealth: Payer: Self-pay

## 2017-06-14 ENCOUNTER — Other Ambulatory Visit (HOSPITAL_COMMUNITY): Payer: Self-pay | Admitting: *Deleted

## 2017-06-14 ENCOUNTER — Other Ambulatory Visit: Payer: Medicare HMO | Admitting: Internal Medicine

## 2017-06-14 DIAGNOSIS — F319 Bipolar disorder, unspecified: Secondary | ICD-10-CM

## 2017-06-14 DIAGNOSIS — R0602 Shortness of breath: Secondary | ICD-10-CM

## 2017-06-14 DIAGNOSIS — I5032 Chronic diastolic (congestive) heart failure: Secondary | ICD-10-CM

## 2017-06-14 NOTE — Telephone Encounter (Signed)
Phone call placed to patient to offer to schedule a visit with Palliative Care. Scheduled for 06/14/17

## 2017-06-14 NOTE — Telephone Encounter (Signed)
Received message to call patient to confirm time of NP visit today. Phone call placed to patient to confirm time of 11:30am

## 2017-06-15 DIAGNOSIS — F411 Generalized anxiety disorder: Secondary | ICD-10-CM | POA: Diagnosis not present

## 2017-06-15 DIAGNOSIS — F5081 Binge eating disorder: Secondary | ICD-10-CM | POA: Diagnosis not present

## 2017-06-15 DIAGNOSIS — F333 Major depressive disorder, recurrent, severe with psychotic symptoms: Secondary | ICD-10-CM | POA: Diagnosis not present

## 2017-06-19 ENCOUNTER — Ambulatory Visit (HOSPITAL_COMMUNITY)
Admission: RE | Admit: 2017-06-19 | Discharge: 2017-06-19 | Disposition: A | Discharge status: Home / Self Care | Payer: Medicare HMO | Source: Ambulatory Visit | Attending: Internal Medicine | Admitting: Internal Medicine

## 2017-06-19 ENCOUNTER — Encounter (HOSPITAL_COMMUNITY)
Admission: RE | Disposition: A | Discharge status: Home / Self Care | Payer: Self-pay | Source: Ambulatory Visit | Attending: Internal Medicine

## 2017-06-19 ENCOUNTER — Telehealth: Payer: Self-pay | Admitting: Internal Medicine

## 2017-06-19 ENCOUNTER — Encounter (HOSPITAL_COMMUNITY): Payer: Self-pay | Admitting: *Deleted

## 2017-06-19 DIAGNOSIS — F419 Anxiety disorder, unspecified: Secondary | ICD-10-CM | POA: Insufficient documentation

## 2017-06-19 DIAGNOSIS — Z6841 Body Mass Index (BMI) 40.0 and over, adult: Secondary | ICD-10-CM | POA: Insufficient documentation

## 2017-06-19 DIAGNOSIS — Z885 Allergy status to narcotic agent status: Secondary | ICD-10-CM | POA: Insufficient documentation

## 2017-06-19 DIAGNOSIS — I130 Hypertensive heart and chronic kidney disease with heart failure and stage 1 through stage 4 chronic kidney disease, or unspecified chronic kidney disease: Secondary | ICD-10-CM | POA: Diagnosis not present

## 2017-06-19 DIAGNOSIS — E1122 Type 2 diabetes mellitus with diabetic chronic kidney disease: Secondary | ICD-10-CM | POA: Diagnosis not present

## 2017-06-19 DIAGNOSIS — E785 Hyperlipidemia, unspecified: Secondary | ICD-10-CM | POA: Diagnosis not present

## 2017-06-19 DIAGNOSIS — I6523 Occlusion and stenosis of bilateral carotid arteries: Secondary | ICD-10-CM | POA: Diagnosis not present

## 2017-06-19 DIAGNOSIS — G4733 Obstructive sleep apnea (adult) (pediatric): Secondary | ICD-10-CM | POA: Insufficient documentation

## 2017-06-19 DIAGNOSIS — R569 Unspecified convulsions: Secondary | ICD-10-CM | POA: Diagnosis not present

## 2017-06-19 DIAGNOSIS — Z882 Allergy status to sulfonamides status: Secondary | ICD-10-CM | POA: Insufficient documentation

## 2017-06-19 DIAGNOSIS — Z7982 Long term (current) use of aspirin: Secondary | ICD-10-CM | POA: Insufficient documentation

## 2017-06-19 DIAGNOSIS — Z9981 Dependence on supplemental oxygen: Secondary | ICD-10-CM | POA: Diagnosis not present

## 2017-06-19 DIAGNOSIS — F329 Major depressive disorder, single episode, unspecified: Secondary | ICD-10-CM | POA: Diagnosis not present

## 2017-06-19 DIAGNOSIS — I252 Old myocardial infarction: Secondary | ICD-10-CM | POA: Diagnosis not present

## 2017-06-19 DIAGNOSIS — Z7984 Long term (current) use of oral hypoglycemic drugs: Secondary | ICD-10-CM | POA: Diagnosis not present

## 2017-06-19 DIAGNOSIS — M1990 Unspecified osteoarthritis, unspecified site: Secondary | ICD-10-CM | POA: Diagnosis not present

## 2017-06-19 DIAGNOSIS — Z8673 Personal history of transient ischemic attack (TIA), and cerebral infarction without residual deficits: Secondary | ICD-10-CM | POA: Diagnosis not present

## 2017-06-19 DIAGNOSIS — Z7951 Long term (current) use of inhaled steroids: Secondary | ICD-10-CM | POA: Insufficient documentation

## 2017-06-19 DIAGNOSIS — F209 Schizophrenia, unspecified: Secondary | ICD-10-CM | POA: Diagnosis not present

## 2017-06-19 DIAGNOSIS — N183 Chronic kidney disease, stage 3 (moderate): Secondary | ICD-10-CM | POA: Diagnosis not present

## 2017-06-19 DIAGNOSIS — R0600 Dyspnea, unspecified: Secondary | ICD-10-CM | POA: Diagnosis present

## 2017-06-19 DIAGNOSIS — J449 Chronic obstructive pulmonary disease, unspecified: Secondary | ICD-10-CM | POA: Insufficient documentation

## 2017-06-19 DIAGNOSIS — K219 Gastro-esophageal reflux disease without esophagitis: Secondary | ICD-10-CM | POA: Insufficient documentation

## 2017-06-19 DIAGNOSIS — Z87891 Personal history of nicotine dependence: Secondary | ICD-10-CM | POA: Insufficient documentation

## 2017-06-19 DIAGNOSIS — I5032 Chronic diastolic (congestive) heart failure: Secondary | ICD-10-CM | POA: Insufficient documentation

## 2017-06-19 HISTORY — PX: RIGHT/LEFT HEART CATH AND CORONARY ANGIOGRAPHY: CATH118266

## 2017-06-19 LAB — POCT I-STAT 3, VENOUS BLOOD GAS (G3P V)
Acid-base deficit: 3 mmol/L — ABNORMAL HIGH (ref 0.0–2.0)
Acid-base deficit: 5 mmol/L — ABNORMAL HIGH (ref 0.0–2.0)
Bicarbonate: 21.6 mmol/L (ref 20.0–28.0)
Bicarbonate: 23.9 mmol/L (ref 20.0–28.0)
O2 Saturation: 64 %
O2 Saturation: 66 %
TCO2: 23 mmol/L (ref 22–32)
TCO2: 25 mmol/L (ref 22–32)
pCO2, Ven: 45.3 mmHg (ref 44.0–60.0)
pCO2, Ven: 50.3 mmHg (ref 44.0–60.0)
pH, Ven: 7.285 (ref 7.250–7.430)
pH, Ven: 7.286 (ref 7.250–7.430)
pO2, Ven: 38 mmHg (ref 32.0–45.0)
pO2, Ven: 38 mmHg (ref 32.0–45.0)

## 2017-06-19 LAB — POCT I-STAT 3, ART BLOOD GAS (G3+)
Acid-base deficit: 5 mmol/L — ABNORMAL HIGH (ref 0.0–2.0)
Bicarbonate: 21.6 mmol/L (ref 20.0–28.0)
O2 Saturation: 98 %
TCO2: 23 mmol/L (ref 22–32)
pCO2 arterial: 43.4 mmHg (ref 32.0–48.0)
pH, Arterial: 7.305 — ABNORMAL LOW (ref 7.350–7.450)
pO2, Arterial: 111 mmHg — ABNORMAL HIGH (ref 83.0–108.0)

## 2017-06-19 LAB — BASIC METABOLIC PANEL
Anion gap: 8 (ref 5–15)
BUN: 20 mg/dL (ref 6–20)
CO2: 23 mmol/L (ref 22–32)
Calcium: 10.1 mg/dL (ref 8.9–10.3)
Chloride: 112 mmol/L — ABNORMAL HIGH (ref 101–111)
Creatinine, Ser: 1.82 mg/dL — ABNORMAL HIGH (ref 0.44–1.00)
GFR calc Af Amer: 34 mL/min — ABNORMAL LOW (ref >60)
GFR calc non Af Amer: 30 mL/min — ABNORMAL LOW (ref >60)
Glucose, Bld: 91 mg/dL (ref 65–99)
Potassium: >7.5 mmol/L (ref 3.5–5.1)
Sodium: 143 mmol/L (ref 135–145)

## 2017-06-19 LAB — POCT I-STAT, CHEM 8
BUN: 17 mg/dL (ref 6–20)
Calcium, Ion: 1.09 mmol/L — ABNORMAL LOW (ref 1.15–1.40)
Chloride: 115 mmol/L — ABNORMAL HIGH (ref 101–111)
Creatinine, Ser: 1.5 mg/dL — ABNORMAL HIGH (ref 0.44–1.00)
Glucose, Bld: 82 mg/dL (ref 65–99)
HCT: 28 % — ABNORMAL LOW (ref 36.0–46.0)
Hemoglobin: 9.5 g/dL — ABNORMAL LOW (ref 12.0–15.0)
Potassium: 6.2 mmol/L — ABNORMAL HIGH (ref 3.5–5.1)
Sodium: 147 mmol/L — ABNORMAL HIGH (ref 135–145)
TCO2: 22 mmol/L (ref 22–32)

## 2017-06-19 LAB — GLUCOSE, CAPILLARY: Glucose-Capillary: 99 mg/dL (ref 65–99)

## 2017-06-19 IMAGING — CR DG CHEST 2V
2 series · 2 of 2 positions shown · non-contrast
Comparison: 01/03/2013.

CLINICAL DATA: Cough.  Chest pain for 2 months.

EXAM:
CHEST  2 VIEW

[w chest pa]
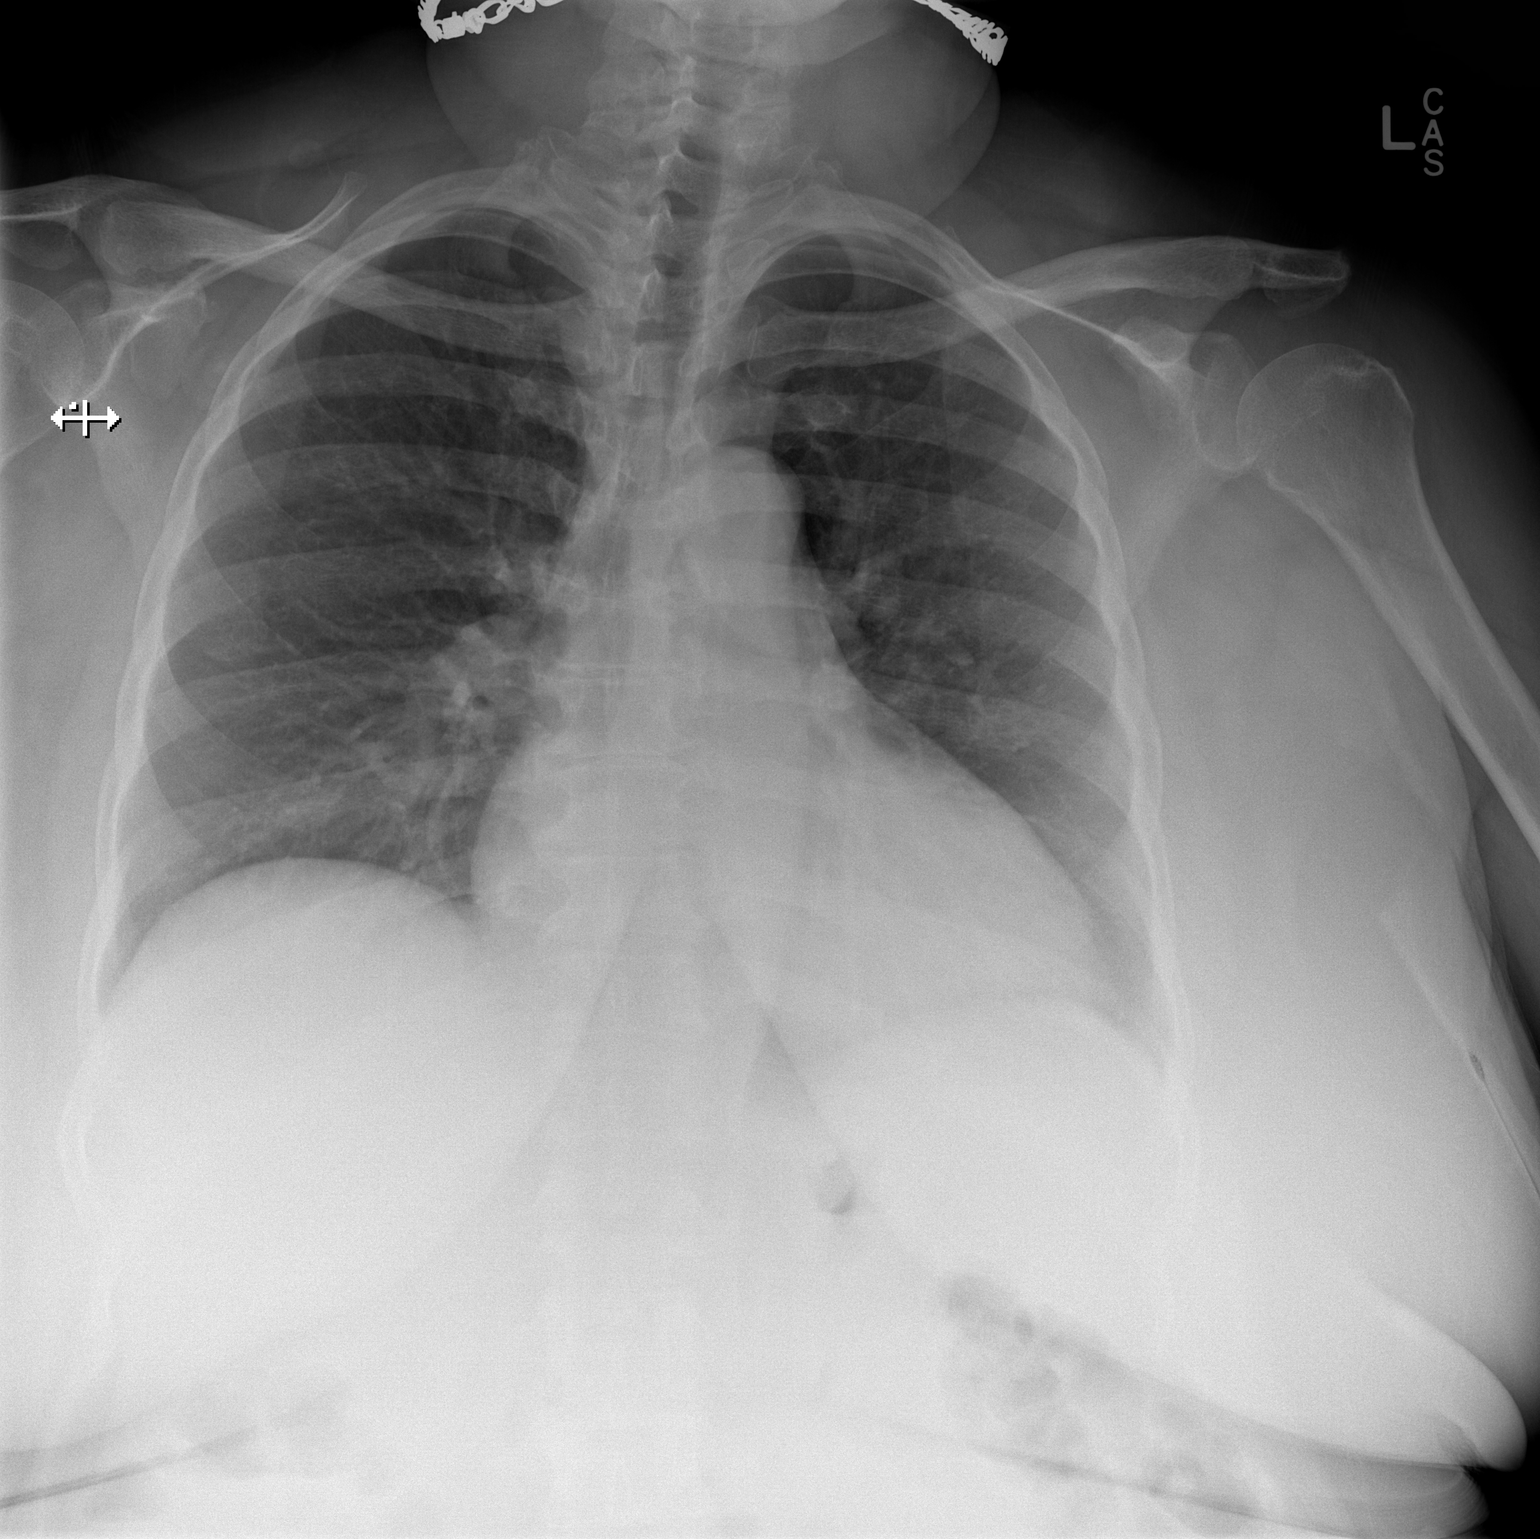

[w chest lat]
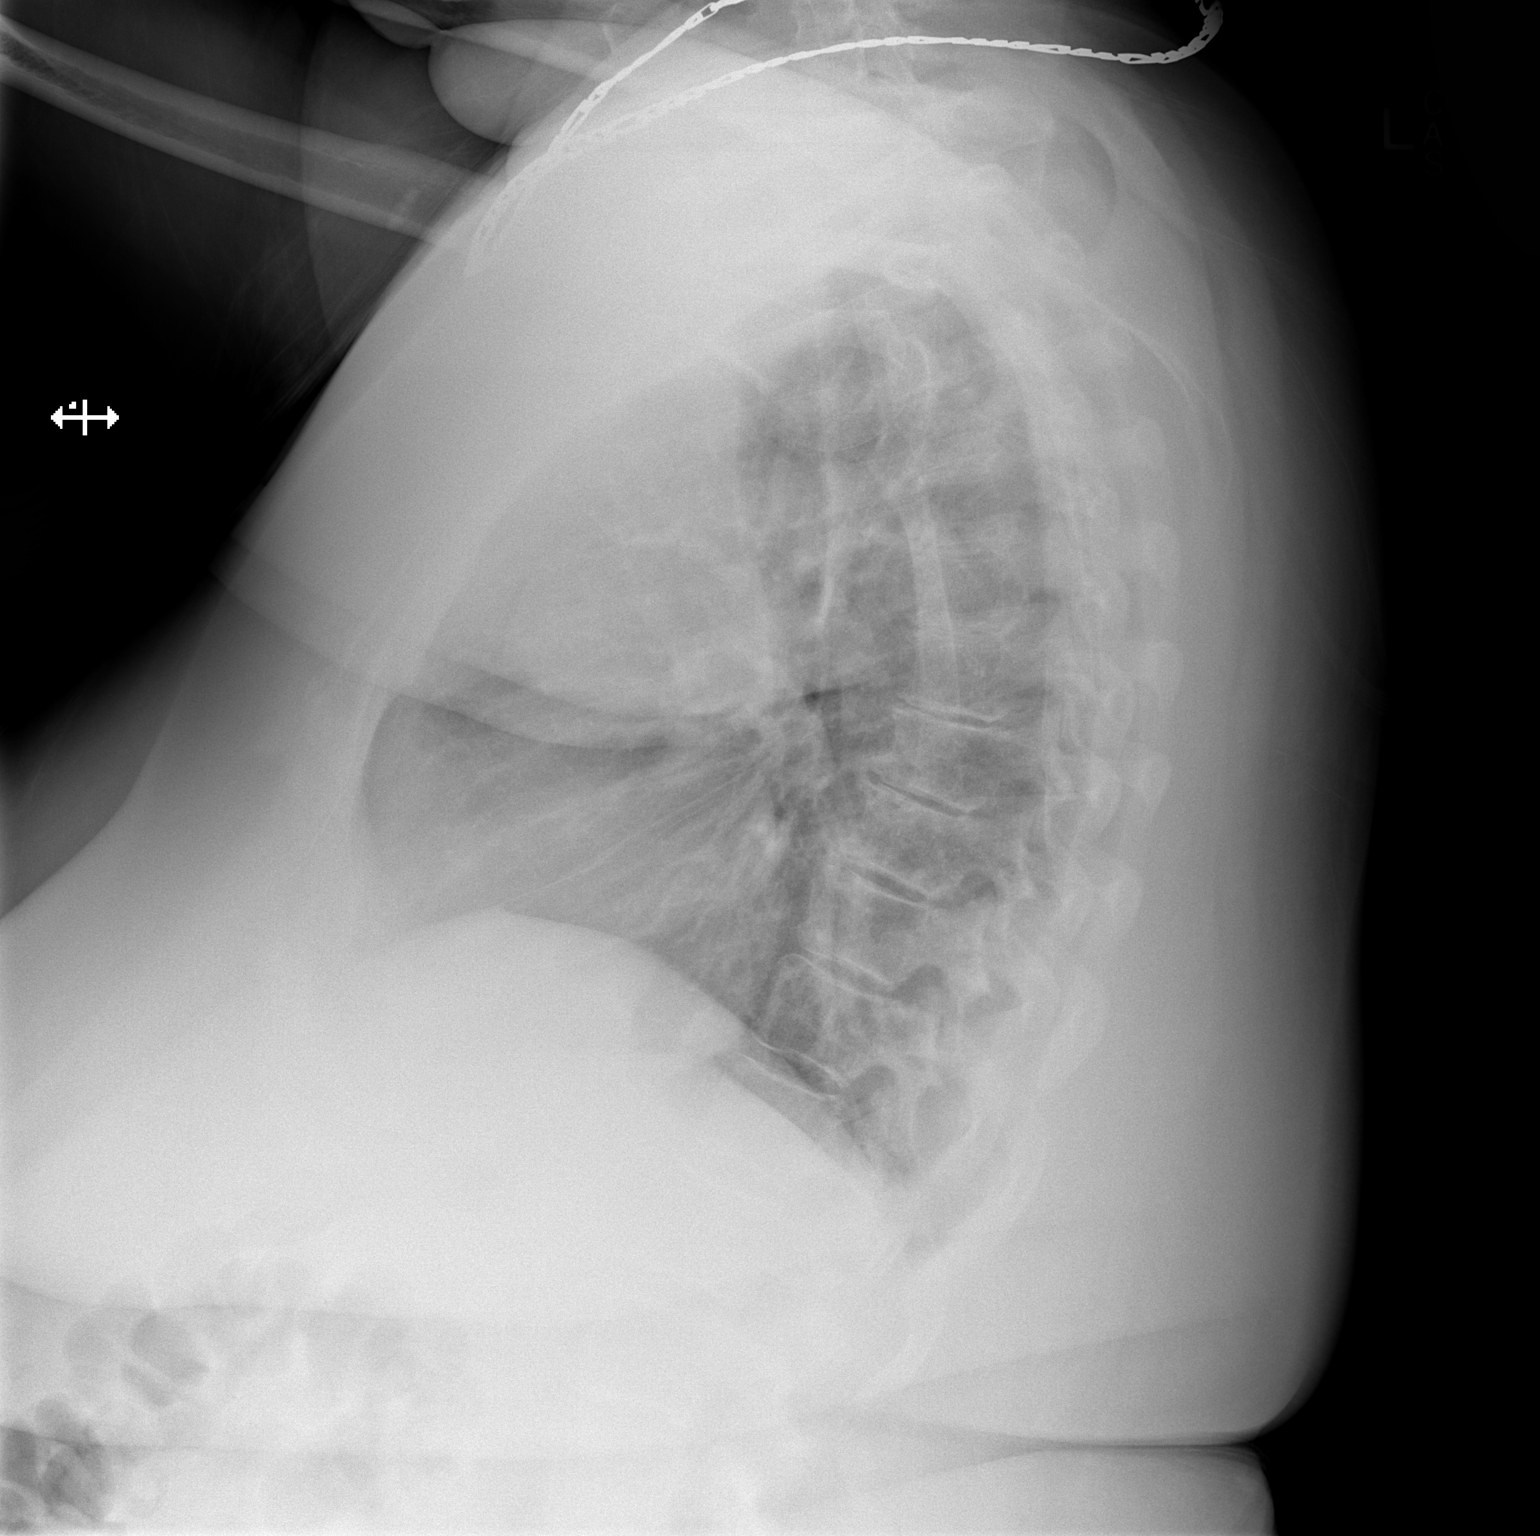

[2 of 2 positions shown; findings below may reference images not displayed]

FINDINGS: Stable enlargement of the cardiopericardial silhouette. No failure.
No airspace disease. No effusion. Suboptimal evaluation of the
anterior mediastinum because the arms are not raised over head.
Thoracic spondylosis. Exam degraded by obese body habitus.
IMPRESSION: Stable cardiomegaly without failure.

## 2017-06-19 SURGERY — RIGHT/LEFT HEART CATH AND CORONARY ANGIOGRAPHY
Anesthesia: LOCAL

## 2017-06-19 MED ORDER — SODIUM CHLORIDE 0.9% FLUSH: 3.0000 mL | Freq: Two times a day (BID) | INTRAVENOUS | Status: DC

## 2017-06-19 MED ORDER — HEPARIN (PORCINE) IN NACL 2-0.9 UNITS/ML
INTRAMUSCULAR | Status: DC | PRN
  Administered 2017-06-19: 500 mL

## 2017-06-19 MED ORDER — SODIUM CHLORIDE 0.9 % IV SOLN: INTRAVENOUS | Status: AC

## 2017-06-19 MED ORDER — VERAPAMIL HCL 2.5 MG/ML IV SOLN
INTRAVENOUS | Status: DC | PRN
  Administered 2017-06-19: 10 mL via INTRA_ARTERIAL

## 2017-06-19 MED ORDER — ACETAMINOPHEN 325 MG PO TABS: 650.0000 mg | ORAL_TABLET | ORAL | Status: DC | PRN

## 2017-06-19 MED ORDER — LIDOCAINE HCL (PF) 1 % IJ SOLN
INTRAMUSCULAR | Status: AC
  Filled 2017-06-19: qty 30

## 2017-06-19 MED ORDER — HEPARIN (PORCINE) IN NACL 1000-0.9 UT/500ML-% IV SOLN
INTRAVENOUS | Status: AC
  Filled 2017-06-19: qty 1000

## 2017-06-19 MED ORDER — ASPIRIN 81 MG PO CHEW: 81.0000 mg | CHEWABLE_TABLET | ORAL | Status: DC

## 2017-06-19 MED ORDER — HEPARIN SODIUM (PORCINE) 1000 UNIT/ML IJ SOLN
INTRAMUSCULAR | Status: DC | PRN
  Administered 2017-06-19: 5000 [IU] via INTRAVENOUS

## 2017-06-19 MED ORDER — IOHEXOL 350 MG/ML SOLN
INTRAVENOUS | Status: DC | PRN
  Administered 2017-06-19: 25 mL via INTRAVENOUS

## 2017-06-19 MED ORDER — SODIUM CHLORIDE 0.9 % IV SOLN: 250.0000 mL | INTRAVENOUS | Status: DC | PRN

## 2017-06-19 MED ORDER — SODIUM CHLORIDE 0.9% FLUSH: 3.0000 mL | INTRAVENOUS | Status: DC | PRN

## 2017-06-19 MED ORDER — HEPARIN SODIUM (PORCINE) 1000 UNIT/ML IJ SOLN
INTRAMUSCULAR | Status: AC
  Filled 2017-06-19: qty 1

## 2017-06-19 MED ORDER — SODIUM CHLORIDE 0.9 % IV SOLN
INTRAVENOUS | Status: DC
  Administered 2017-06-19: 12:00:00 via INTRAVENOUS

## 2017-06-19 MED ORDER — LIDOCAINE HCL (PF) 1 % IJ SOLN
INTRAMUSCULAR | Status: DC | PRN
  Administered 2017-06-19: 5 mL

## 2017-06-19 MED ORDER — ONDANSETRON HCL 4 MG/2ML IJ SOLN
4.0000 mg | Freq: Four times a day (QID) | INTRAMUSCULAR | Status: DC | PRN
Start: 2017-06-19 — End: 2017-06-19

## 2017-06-19 MED ORDER — HEPARIN (PORCINE) IN NACL 2-0.9 UNITS/ML
INTRAMUSCULAR | Status: AC | PRN
  Administered 2017-06-19: 500 mL

## 2017-06-19 MED ORDER — VERAPAMIL HCL 2.5 MG/ML IV SOLN
INTRAVENOUS | Status: AC
  Filled 2017-06-19: qty 2

## 2017-06-19 SURGICAL SUPPLY — 16 items
CATH 5FR JL3.5 JR4 ANG PIG MP (CATHETERS) ×1 IMPLANT
CATH BALLN WEDGE 5F 110CM (CATHETERS) ×1 IMPLANT
CATH INFINITI 5 FR 3DRC (CATHETERS) ×1 IMPLANT
DEVICE RAD COMP TR BAND LRG (VASCULAR PRODUCTS) ×1 IMPLANT
GUIDEWIRE INQWIRE 1.5J.035X260 (WIRE) IMPLANT
HOVERMATT SINGLE USE (MISCELLANEOUS) ×1 IMPLANT
INQWIRE 1.5J .035X260CM (WIRE) ×2
KIT HEART LEFT (KITS) ×2 IMPLANT
NDL PERC 21GX4CM (NEEDLE) IMPLANT
NEEDLE PERC 21GX4CM (NEEDLE) ×2 IMPLANT
PACK CARDIAC CATHETERIZATION (CUSTOM PROCEDURE TRAY) ×2 IMPLANT
SHEATH RAIN 4/5FR (SHEATH) ×1 IMPLANT
SHEATH RAIN RADIAL 21G 6FR (SHEATH) ×1 IMPLANT
TRANSDUCER W/STOPCOCK (MISCELLANEOUS) ×2 IMPLANT
TUBING CIL FLEX 10 FLL-RA (TUBING) ×2 IMPLANT
WIRE EMERALD 3MM-J .025X260CM (WIRE) ×1 IMPLANT

## 2017-06-19 NOTE — Progress Notes (Signed)
Pharmacy paged X2 to come and complete med rec.  They did not get here to do it prior to cath lab needing patient. Cath lab stated they want her anyway and she has been sent there.

## 2017-06-19 NOTE — Telephone Encounter (Signed)
Pt called and said she has an appointment at the hospital this morning at 10. She would like to pick up a printed copy of all the current medications she is taking. She said she will be here at 9:30.

## 2017-06-19 NOTE — Telephone Encounter (Signed)
Printed off and placed up front. Deseree Kennon Holter, CMA

## 2017-06-19 NOTE — Interval H&P Note (Signed)
History and Physical Interval Note:  06/19/2017 12:49 PM  Tiffany [name]  has presented today for surgery, with the diagnosis of chf  The various methods of treatment have been discussed with the patient and family. After consideration of risks, benefits and other options for treatment, the patient has consented to  Procedure(s): RIGHT/LEFT HEART CATH AND CORONARY ANGIOGRAPHY (N/A) and possible coronary angioplasty as a surgical intervention .  The patient's history has been reviewed, patient examined, no change in status, stable for surgery.  I have reviewed the patient's chart and labs.  Questions were answered to the patient's satisfaction.     [name]

## 2017-06-19 NOTE — Discharge Instructions (Signed)

## 2017-06-20 ENCOUNTER — Telehealth: Payer: Self-pay | Admitting: Internal Medicine

## 2017-06-20 ENCOUNTER — Encounter: Payer: Self-pay | Admitting: Internal Medicine

## 2017-06-20 DIAGNOSIS — J9611 Chronic respiratory failure with hypoxia: Secondary | ICD-10-CM | POA: Diagnosis not present

## 2017-06-20 DIAGNOSIS — M109 Gout, unspecified: Secondary | ICD-10-CM

## 2017-06-20 MED ORDER — MELOXICAM 7.5 MG PO TABS: 7.5000 mg | ORAL_TABLET | Freq: Every day | ORAL | 0 refills | Status: DC

## 2017-06-20 NOTE — Telephone Encounter (Signed)
Called patient. Discussed new rheumatology medication. Still having joint pains, would like a refill on Mobic. Follow up on the June 10th.

## 2017-06-20 NOTE — Telephone Encounter (Signed)
Pt called back and wanted to leave the best number to contact her at, [phone]

## 2017-06-20 NOTE — Telephone Encounter (Signed)
Pt would like for Dr Emmaline Life to call her asap. She has questions about some of her medications. I offered to transfer her to the nurse line but she said she did not want to speak with a nurse, she wanted to speak with her doctor.

## 2017-06-21 DIAGNOSIS — M6281 Muscle weakness (generalized): Secondary | ICD-10-CM | POA: Diagnosis not present

## 2017-06-21 DIAGNOSIS — G4733 Obstructive sleep apnea (adult) (pediatric): Secondary | ICD-10-CM | POA: Diagnosis not present

## 2017-06-21 DIAGNOSIS — J440 Chronic obstructive pulmonary disease with acute lower respiratory infection: Secondary | ICD-10-CM | POA: Diagnosis not present

## 2017-06-21 DIAGNOSIS — J189 Pneumonia, unspecified organism: Secondary | ICD-10-CM | POA: Diagnosis not present

## 2017-06-21 DIAGNOSIS — R062 Wheezing: Secondary | ICD-10-CM | POA: Diagnosis not present

## 2017-06-21 DIAGNOSIS — J449 Chronic obstructive pulmonary disease, unspecified: Secondary | ICD-10-CM | POA: Diagnosis not present

## 2017-06-21 DIAGNOSIS — R0602 Shortness of breath: Secondary | ICD-10-CM | POA: Diagnosis not present

## 2017-06-21 DIAGNOSIS — I509 Heart failure, unspecified: Secondary | ICD-10-CM | POA: Diagnosis not present

## 2017-06-21 DIAGNOSIS — J45909 Unspecified asthma, uncomplicated: Secondary | ICD-10-CM | POA: Diagnosis not present

## 2017-06-22 DIAGNOSIS — F411 Generalized anxiety disorder: Secondary | ICD-10-CM | POA: Diagnosis not present

## 2017-06-22 DIAGNOSIS — F5081 Binge eating disorder: Secondary | ICD-10-CM | POA: Diagnosis not present

## 2017-06-22 DIAGNOSIS — F330 Major depressive disorder, recurrent, mild: Secondary | ICD-10-CM | POA: Diagnosis not present

## 2017-06-23 DIAGNOSIS — J9611 Chronic respiratory failure with hypoxia: Secondary | ICD-10-CM | POA: Diagnosis not present

## 2017-06-24 DIAGNOSIS — R062 Wheezing: Secondary | ICD-10-CM | POA: Diagnosis not present

## 2017-06-24 DIAGNOSIS — R0602 Shortness of breath: Secondary | ICD-10-CM | POA: Diagnosis not present

## 2017-06-24 DIAGNOSIS — J189 Pneumonia, unspecified organism: Secondary | ICD-10-CM | POA: Diagnosis not present

## 2017-06-24 DIAGNOSIS — J45909 Unspecified asthma, uncomplicated: Secondary | ICD-10-CM | POA: Diagnosis not present

## 2017-06-24 DIAGNOSIS — G4733 Obstructive sleep apnea (adult) (pediatric): Secondary | ICD-10-CM | POA: Diagnosis not present

## 2017-06-24 DIAGNOSIS — J440 Chronic obstructive pulmonary disease with acute lower respiratory infection: Secondary | ICD-10-CM | POA: Diagnosis not present

## 2017-06-25 ENCOUNTER — Other Ambulatory Visit: Payer: Self-pay

## 2017-06-25 ENCOUNTER — Ambulatory Visit (INDEPENDENT_AMBULATORY_CARE_PROVIDER_SITE_OTHER): Payer: Medicare HMO | Admitting: Internal Medicine

## 2017-06-25 ENCOUNTER — Encounter: Payer: Self-pay | Admitting: Internal Medicine

## 2017-06-25 VITALS — BP 140/72 | HR 106 | Temp 98.8°F | Ht 64.0 in | Wt 283.2 lb

## 2017-06-25 DIAGNOSIS — N3281 Overactive bladder: Secondary | ICD-10-CM | POA: Diagnosis not present

## 2017-06-25 DIAGNOSIS — F200 Paranoid schizophrenia: Secondary | ICD-10-CM | POA: Diagnosis not present

## 2017-06-25 DIAGNOSIS — D649 Anemia, unspecified: Secondary | ICD-10-CM | POA: Diagnosis not present

## 2017-06-25 DIAGNOSIS — N39490 Overflow incontinence: Secondary | ICD-10-CM | POA: Insufficient documentation

## 2017-06-25 DIAGNOSIS — M109 Gout, unspecified: Secondary | ICD-10-CM | POA: Diagnosis not present

## 2017-06-25 DIAGNOSIS — E875 Hyperkalemia: Secondary | ICD-10-CM

## 2017-06-25 MED ORDER — MELOXICAM 7.5 MG PO TABS: 7.5000 mg | ORAL_TABLET | Freq: Every day | ORAL | 0 refills | Status: DC

## 2017-06-25 MED ORDER — TRAZODONE HCL 100 MG PO TABS: ORAL_TABLET | ORAL | Status: DC

## 2017-06-25 MED ORDER — MIRABEGRON ER 25 MG PO TB24: 25.0000 mg | ORAL_TABLET | Freq: Every day | ORAL | 0 refills | Status: DC

## 2017-06-25 MED ORDER — FEBUXOSTAT 80 MG PO TABS: 80.0000 mg | ORAL_TABLET | Freq: Every day | ORAL | Status: DC

## 2017-06-25 NOTE — Assessment & Plan Note (Signed)
Likely anemia of chronic disease  -Has been taking iron once daily -Discussed with patient she needs to get an FOBT still as she does not want to have a colonoscopy done - Check iron level

## 2017-06-25 NOTE — Patient Instructions (Signed)
Going to start you on Myrbetriq for your overactive bladder.  You cannot take  oxybutynin anymore because of your kidney function.  I will check your kidneys to make sure that you do not have high potassium anymore.  I will also check your iron. Follow up in two weeks.

## 2017-06-25 NOTE — Assessment & Plan Note (Signed)
Will start on Myrbetriq -as long as GFR is greater than 15 can continue this medication Follow-up in 2 weeks If patient does not find significant improvement will consider follow-up with urology

## 2017-06-25 NOTE — Progress Notes (Signed)
   [name] [name], MD Phone: [phone]  Reason For Visit: Follow up   # Gout  - Patient still having joint pains in upper and lower extremities.  Has been taking Febuxostat. Takes the mobic daily as well. No swelling and erthyema. No fevers   # Hyperkalemia  - Patient was recently by Dr. Tempie Hoist.  She states that she had an increase in her potassium at that time for her catheterization.   #Anemia  -Has been taking iron, doing well with this  -Has not yet brought in her FOBT test -Denies any associated dizziness or palpitations-however she states she often gets short of breath likely due to her COPD  #Overactive bladder -History of overactive bladder disease.  -No incontinence associated with intra-abdominal pressure/sneezing coughing laughing -Indicates that she does have urge incontinence with a sudden desire to urinate and then having to go the bathroom immediately -Previously was on oxybutynin and was taken off of this medication due to her kidney function -Denies any incomplete bladder emptying or urinary leakage or dribbling  Past Medical History Reviewed problem list.  Medications- reviewed and updated No additions to family history Social history- patient is a previous smoker  Objective: BP 140/72   Pulse (!) 106   Temp 98.8 F (37.1 C) (Oral)   Ht 5\' 4"  (1.626 m)   Wt 283 lb 3.2 oz (128.5 kg)   SpO2 90%   BMI 48.61 kg/m  Gen: NAD, alert, cooperative with exam Cardio: regular rate and rhythm, S1S2 heard, no murmurs appreciated Pulm: clear to auscultation bilaterally, no wheezes, rhonchi or rales Skin: dry, intact, no rashes or lesions    Assessment/Plan: See problem based a/p  Overactive bladder Will start on Myrbetriq -as long as GFR is greater than 15 can continue this medication Follow-up in 2 weeks If patient does not find significant improvement will consider follow-up with urology  Gout Continue Mobic daily  with tylenol for breakthrough pain Currently following with rheumatology, taking febuxostat     Hyperkalemia Check BMET   Anemia Likely anemia of chronic disease  -Has been taking iron once daily -Discussed with patient she needs to get an FOBT still as she does not want to have a colonoscopy done - Check iron level

## 2017-06-25 NOTE — Assessment & Plan Note (Addendum)
Continue Mobic daily with tylenol for breakthrough pain Currently following with rheumatology, taking febuxostat

## 2017-06-25 NOTE — Assessment & Plan Note (Signed)
Check BMET 

## 2017-06-26 ENCOUNTER — Other Ambulatory Visit: Payer: Self-pay | Admitting: *Deleted

## 2017-06-26 ENCOUNTER — Encounter: Payer: Self-pay | Admitting: Family Medicine

## 2017-06-26 ENCOUNTER — Other Ambulatory Visit: Payer: Self-pay

## 2017-06-26 ENCOUNTER — Ambulatory Visit (INDEPENDENT_AMBULATORY_CARE_PROVIDER_SITE_OTHER): Payer: Medicare HMO | Admitting: Family Medicine

## 2017-06-26 DIAGNOSIS — Z1211 Encounter for screening for malignant neoplasm of colon: Secondary | ICD-10-CM

## 2017-06-26 DIAGNOSIS — R0981 Nasal congestion: Secondary | ICD-10-CM

## 2017-06-26 DIAGNOSIS — J9611 Chronic respiratory failure with hypoxia: Secondary | ICD-10-CM | POA: Diagnosis not present

## 2017-06-26 DIAGNOSIS — E118 Type 2 diabetes mellitus with unspecified complications: Secondary | ICD-10-CM

## 2017-06-26 LAB — BASIC METABOLIC PANEL
BUN/Creatinine Ratio: 9 (ref 9–23)
BUN: 20 mg/dL (ref 6–24)
CO2: 24 mmol/L (ref 20–29)
Calcium: 9.7 mg/dL (ref 8.7–10.2)
Chloride: 101 mmol/L (ref 96–106)
Creatinine, Ser: 2.35 mg/dL — ABNORMAL HIGH (ref 0.57–1.00)
GFR calc Af Amer: 26 mL/min/{1.73_m2} — ABNORMAL LOW (ref >59)
GFR calc non Af Amer: 22 mL/min/{1.73_m2} — ABNORMAL LOW (ref >59)
Glucose: 116 mg/dL — ABNORMAL HIGH (ref 65–99)
Potassium: 3.9 mmol/L (ref 3.5–5.2)
Sodium: 144 mmol/L (ref 134–144)

## 2017-06-26 LAB — IRON AND TIBC
Iron Saturation: 15 % (ref 15–55)
Iron: 44 ug/dL (ref 27–159)
Total Iron Binding Capacity: 290 ug/dL (ref 250–450)
UIBC: 246 ug/dL (ref 131–425)

## 2017-06-26 MED ORDER — FLUNISOLIDE 25 MCG/ACT (0.025%) NA SOLN: 2.0000 | Freq: Every day | NASAL | 2 refills | Status: DC

## 2017-06-26 NOTE — Progress Notes (Signed)
Medical Nutrition Therapy:  Appt start time: 1130 end time:  1230. PCP: [name], MD Psychiatrist; Tiffany Clan, MD Son [name] (lives with pt)  Assessment:  Primary concerns today: Weight management and Blood sugar control.  Tiffany [name] has started weighing herself regularly at home.  She has also been seeing a counselor named Tiffany [name] to work on binge eating.  Psychiatrist Tiffany [name] has also prescribed Vyvanse to help control appetite.  She said this has been very helpful.    Weight loss suggests she has certainly made some changes, although diet history and 24-hr recall suggest concern that she is not nourishing herself well.  Her eating pattern is still erratic, and there seems to be little nutritional balance to meals or snacks.  She insists that this will change once she can get back to at least one cooked meal per day:  She has a new aide coming today who will be there from 3 to 5:30, and will prepare dinner 7 days a week.  Tiffany [name] has a dog now, a retriever-pit bull mix, which she seems very happy about.    Tiffany [name] is hoping to get into the pulmonary rehab program, but is not approved as yet b/c of her use of a wheelchair.    FBG has been running between 90-100.  [name] no longer drinking soda, beginning when she started taking Vyvanse.  She does usually  sip on ~12 oz of fruit juice through the day.    24-hr recall:  (Up at 7 AM) 9 AM:   Water with med's B (9 AM)-  --- Snk ( AM)-  water L ( PM)-  --- Snk (3 PM)-  2 mozzarella sticks, 12 oz sweet tea D (6:30 PM)-  1 1/2 c Sugar Snaps cereal, 10 oz 2% milk Snk ( PM)-  --- Typical day? No. When she has her aide coming, she cooks dinner that is likely to be something like chicken and broccoli.  She usually get a veg serving when she has an Engineer, production.    Progress Towards Goal(s):  In progress.   Nutritional Diagnosis:  NB-1.1 Food and nutrition-related knowledge deficit As related to weight management and BG control.  As  evidenced by patient acknowledgement.    Intervention:  Nutrition education.  Handouts given during visit include:  AVS (large print)  Demonstrated degree of understanding via:  Teach Back  Barriers to learning/adherence to lifestyle change: Knowledge deficit as well as disinterest in food preparation and food insecurity.   Monitoring/Evaluation:  Dietary intake, exercise, FBG, and body weight in 4 week(s).

## 2017-06-26 NOTE — Patient Instructions (Addendum)
-   You may want to try mixing your fruit juice with seltzer or club soda.    - In addition to working with Rollene Fare on binge eating, it will be helpful to managing your appetite if you are consistently eating at least a light meal for breakfast, lunch, and dinner.    - You don't like to eat breakfast or lunch, but your body, including all your organs and your immune system, will be able to function better if you can nourish yourself with regularity.    - Ask your aide if she can make a nice vegetable soup, some of which you can freeze.    - This would be a good way to get both vegetables and water into you.    - Remind her to not add salt to foods, and to use any cooking methods other than frying.    - Your meals the aide prepares OR that you get from take-out:  Include a source of protein, some (but not too much) starch, and some vegetables for ANY lunch or dinner.    - Use the Meal Planning Form provided today to work with your aide to determine some meals you like and that meet your nutritional needs.    - Aim for meeting your daily fluid needs: total intake between 1 to 1 1/2 liters per day.

## 2017-06-27 ENCOUNTER — Telehealth: Payer: Self-pay | Admitting: Internal Medicine

## 2017-06-27 DIAGNOSIS — J9611 Chronic respiratory failure with hypoxia: Secondary | ICD-10-CM | POA: Diagnosis not present

## 2017-06-27 NOTE — Telephone Encounter (Signed)
Called patient to discuss her kidney function. Will have her decrease torsemide to once daily and stop Mobic for next 3-4 days. Follow up in week to check kidney function.

## 2017-06-27 NOTE — Telephone Encounter (Signed)
Pt called and said Tiffany [name] had called her and wanted to talk to her. Please call pt back.

## 2017-06-28 ENCOUNTER — Telehealth (HOSPITAL_COMMUNITY): Payer: Self-pay

## 2017-06-28 DIAGNOSIS — J9611 Chronic respiratory failure with hypoxia: Secondary | ICD-10-CM | POA: Diagnosis not present

## 2017-06-28 LAB — FECAL OCCULT BLOOD, IMMUNOCHEMICAL: Fecal Occult Bld: NEGATIVE

## 2017-06-28 NOTE — Telephone Encounter (Signed)
Called patient in regards to Pulmonary Rehab - Scheduled orientation on 07/02/2017 at 12:00pm. Patient will attend the 10:30am exc class. Mailed packet.

## 2017-06-28 NOTE — Progress Notes (Signed)
PALLIATIVE CARE CONSULT VISIT   PATIENT NAME: Tiffany [name] DOB: 1960-04-08 MRN: [id number]  PRIMARY CARE PROVIDER: [name], MD  REFERRING PROVIDER: [name], [place], [place] [address]  RESPONSIBLE PARTY:  Self  BILLABLE DIAGNOSIS:   RECOMMENDATIONS and PLAN:  1.  Shortness of breath/Dependence on continuous oxygen R06.02:  Chronic and unchanged.  Pending pulmonary rehab.and cardiac cath.  2.  Bipolar diorder F31.9:  Depressed today but improved after pt. Verbalized concerns.  Denies SI or plans.  Pending appt with behavioral health provider.  Attempt to decrease stresses.  Support given.  3  Advanced care planning: Full scope of treatment. Maintain management of heart failure and COPD.  Palliative care will continue to follow.  I spent 120 minutes providing this consultation at home,  from 11:30AM to 12:3PM at home. More than 50% of the time in this consultation was spent coordinating with patient communication.   HISTORY OF PRESENT ILLNESS:  Followup with Tiffany [name] finds that she has been without return to the hospital.  Recent occurrences of increased anxiety and depression due to situational occurrences.  No reports of falls or exacerbations of heart failure.  Reports of improved mobilization since receiving motorized chair.  CODE STATUS: FULL CODE  PPS: 50% HOSPICE ELIGIBILITY/DIAGNOSIS: TBD  PAST MEDICAL HISTORY:  Past Medical History:  Diagnosis Date  . Anxiety   . Arthritis    "all over" (04/10/2016)  . Asthma   . Cardiac arrest ([place]) 09/08/2016   PEA  . Carotid artery stenosis    1-39% bilateral by dopplers 11/2016  . Chronic bronchitis ([place])   . Chronic diastolic (congestive) heart failure ([place])   . Chronic kidney disease    "I see a kidney dr." (04/10/2016)  . Cocaine abuse ([place])   . Complication of anesthesia    decreased bp, decreased heart rate  . Depression   . Disorder of nervous system   . Emphysema     . GERD (gastroesophageal reflux disease)   . Heart attack ([place]) 1980s  . History of blood transfusion 1994   "couldn't stop bleeding from my period"  . Hyperlipidemia LDL goal <70   . Hypertension   . Incontinence   . Manic depression ([employer])   . On home oxygen therapy    "6L; 24/7" (04/10/2016)  . OSA on CPAP    "wear mask sometimes" (04/10/2016)  . Paranoid ([place])    "sometimes; I'm on RX for it" (04/10/2016)  . Pneumonia    "I've had it several times; haven't had it since 06/2015" (04/10/2016)  . Schizophrenia ([place])   . Seasonal allergies   . Seizures ([place])    "don't know what kind; last one was ~ 1 yr ago" (04/10/2016)  . Sinus trouble   . Stroke [facility]) 1980s   denies residual on 04/10/2016  . Type II diabetes mellitus ([place])     SOCIAL HX:  Social History   Tobacco Use  . Smoking status: Former Smoker    Packs/day: 1.50    Years: 38.00    Pack years: 57.00    Types: Cigarettes    Start date: 03/13/1977    Last attempt to quit: 04/10/2016    Years since quitting: 1.0  . Smokeless tobacco: Never Used  Substance Use Topics  . Alcohol use: No    Alcohol/week: 0.0 oz    ALLERGIES:  Allergies  Allergen Reactions  . Hydrocodone-Acetaminophen Shortness Of Breath  . Hydroxyzine Anaphylaxis and Shortness  Of Breath  . Latuda [Lurasidone Hcl] Anaphylaxis  . Codeine Nausea And Vomiting  . Sulfa Antibiotics Itching  . Tape Rash     PERTINENT MEDICATIONS:  Outpatient Encounter Medications as of 05/14/2017  Medication Sig  . acetaminophen (TYLENOL) 500 MG tablet Take 1 tablet (500 mg total) by mouth every 6 (six) hours as needed. (Patient taking differently: Take 2,000 mg by mouth 3 (three) times daily as needed for headache (pain). )  . albuterol (PROVENTIL HFA;VENTOLIN HFA) 108 (90 Base) MCG/ACT inhaler Inhale 2 puffs into the lungs every 6 (six) hours as needed for wheezing or shortness of breath.  [name] albuterol (PROVENTIL) (2.5 MG/3ML) 0.083% nebulizer solution Take 3 mLs (2.5 mg  total) every 6 (six) hours as needed by nebulization for wheezing or shortness of breath.  . allopurinol (ZYLOPRIM) 100 MG tablet Take 100 mg by mouth 2 (two) times daily.  [name] amLODipine (NORVASC) 5 MG tablet Take 1 tablet (5 mg total) by mouth daily.  [name] aspirin 81 MG EC tablet Take 1 tablet (81 mg total) by mouth daily. Swallow whole.  [name] atorvastatin (LIPITOR) 80 MG tablet Take 1 tablet (80 mg total) by mouth daily.  [name] BREO ELLIPTA 100-25 MCG/INH AEPB INHALE 1 PUFF EVERY DAY  . chlorhexidine (PERIDEX) 0.12 % solution Use as directed 15 mLs in the mouth or throat See admin instructions. Swish 1/2 oz (15 mls) for 30 seconds and then spit - twice daily  . clomiPRAMINE (ANAFRANIL) 50 MG capsule Take 50 mg by mouth at bedtime.  . Diclofenac Sodium 3 % GEL Use on area of the pain (Patient taking differently: Apply 1 application topically daily as needed (pain). Use on area of the pain)  . escitalopram (LEXAPRO) 20 MG tablet Take 1 tablet (20 mg total) by mouth daily.  . ferrous sulfate 325 (65 FE) MG tablet Take 1 tablet (325 mg total) by mouth daily.  . flunisolide (NASALIDE) 25 MCG/ACT (0.025%) SOLN Place 2 sprays into the nose daily.  [name] gabapentin (NEURONTIN) 600 MG tablet Take 1 tablet (600 mg total) by mouth 3 (three) times daily.  [name] lisdexamfetamine (VYVANSE) 40 MG capsule Take 40 mg by mouth daily.  . metFORMIN (GLUCOPHAGE-XR) 500 MG 24 hr tablet Take 1 tablet (500 mg total) by mouth 2 (two) times daily. (Patient not taking: Reported on 05/12/2017)  . metFORMIN (GLUCOPHAGE-XR) 500 MG 24 hr tablet Take 1 tablet (500 mg total) 2 (two) times daily by mouth.  . metolazone (ZAROXOLYN) 2.5 MG tablet Take 1 tablet (2.5 mg total) by mouth every Monday, Wednesday, and Friday. Take 30 mins prior to taking Torsemide  . mirtazapine (REMERON SOL-TAB) 45 MG disintegrating tablet Take 1 tablet (45 mg total) by mouth at bedtime. (Patient not taking: Reported on 05/12/2017)  . montelukast (SINGULAIR) 10 MG tablet  Take 1 tablet (10 mg total) by mouth at bedtime.  . Multiple Vitamin (MULTIVITAMIN WITH MINERALS) TABS tablet Take 1 tablet by mouth daily.  [name] omeprazole (PRILOSEC) 40 MG capsule Take 1 capsule (40 mg total) by mouth 2 (two) times daily. in the morning and evening  . OXYGEN Inhale 6 L into the lungs continuous.   . potassium chloride (K-DUR) 10 MEQ tablet Take 4 tablets (40 mEq total) by mouth 3 (three) times daily. May also take 4 tablets (40 mEq total) at bedtime as needed ([place] Metolazone on M/W/F).  [name] predniSONE (DELTASONE) 5 MG tablet Take 7.5 mg by mouth daily.  . QUEtiapine (SEROQUEL) 200 MG tablet Take 400  mg by mouth at bedtime.  [name] tiotropium (SPIRIVA HANDIHALER) 18 MCG inhalation capsule Place 1 capsule (18 mcg total) into inhaler and inhale daily.  [name] torsemide (DEMADEX) 20 MG tablet Take 4 tablets (80 mg total) by mouth 2 (two) times daily.   No facility-administered encounter medications on file as of 05/14/2017.     PHYSICAL EXAM:  P 98  R 20  O2 sats 97% General: Morbidly obese female in motorized chair.  In NAD Cardiovascular: regular rate and rhythm Pulmonary: clear throughout.  O2 via [place] in use Abdomen: large, soft, nontender, + bowel sounds Extremities: no edema, no joint deformities Skin:Exposed skin is intact. Neurological: Alert and oriented x3, Generalized weakness PSYCH:  Tearful with depressed mood.  Denies thoughts or plans of self harm.  Mood improved after pt discussed current stressors.  Gonzella Lex, NP-C

## 2017-06-29 ENCOUNTER — Ambulatory Visit
Admission: RE | Admit: 2017-06-29 | Discharge: 2017-06-29 | Disposition: A | Discharge status: Home / Self Care | Payer: Medicare HMO | Source: Ambulatory Visit | Attending: Family Medicine | Admitting: Family Medicine

## 2017-06-29 DIAGNOSIS — Z1231 Encounter for screening mammogram for malignant neoplasm of breast: Secondary | ICD-10-CM | POA: Diagnosis not present

## 2017-06-30 DIAGNOSIS — J9611 Chronic respiratory failure with hypoxia: Secondary | ICD-10-CM | POA: Diagnosis not present

## 2017-07-02 ENCOUNTER — Telehealth: Payer: Self-pay

## 2017-07-02 ENCOUNTER — Encounter (HOSPITAL_COMMUNITY)
Admission: RE | Admit: 2017-07-02 | Discharge: 2017-07-02 | Disposition: A | Discharge status: Home / Self Care | Payer: Medicare HMO | Source: Ambulatory Visit | Attending: Internal Medicine | Admitting: Internal Medicine

## 2017-07-02 ENCOUNTER — Telehealth (HOSPITAL_COMMUNITY): Payer: Self-pay | Admitting: *Deleted

## 2017-07-02 DIAGNOSIS — I509 Heart failure, unspecified: Secondary | ICD-10-CM | POA: Diagnosis not present

## 2017-07-02 DIAGNOSIS — J440 Chronic obstructive pulmonary disease with acute lower respiratory infection: Secondary | ICD-10-CM | POA: Diagnosis not present

## 2017-07-02 DIAGNOSIS — J189 Pneumonia, unspecified organism: Secondary | ICD-10-CM | POA: Diagnosis not present

## 2017-07-02 DIAGNOSIS — J45909 Unspecified asthma, uncomplicated: Secondary | ICD-10-CM | POA: Diagnosis not present

## 2017-07-02 DIAGNOSIS — R0602 Shortness of breath: Secondary | ICD-10-CM | POA: Diagnosis not present

## 2017-07-02 DIAGNOSIS — J9611 Chronic respiratory failure with hypoxia: Secondary | ICD-10-CM | POA: Diagnosis not present

## 2017-07-02 DIAGNOSIS — J449 Chronic obstructive pulmonary disease, unspecified: Secondary | ICD-10-CM | POA: Diagnosis not present

## 2017-07-02 DIAGNOSIS — G4733 Obstructive sleep apnea (adult) (pediatric): Secondary | ICD-10-CM | POA: Diagnosis not present

## 2017-07-02 DIAGNOSIS — M6281 Muscle weakness (generalized): Secondary | ICD-10-CM | POA: Diagnosis not present

## 2017-07-02 DIAGNOSIS — R062 Wheezing: Secondary | ICD-10-CM | POA: Diagnosis not present

## 2017-07-02 NOTE — Progress Notes (Signed)
Pulmonary Rehabilitation [place] arrived for orientation to pulmonary rehab today, due to her many health problems she came with the knowledge that she was being evaluated to see if she appropriate for the pulmonary rehab group exercise program.  Janari has a history of multiple falls, with the last being November 2018 which required her to not weight bear for 5 months.  She reports that physical therapy was very limited at home and is not able to walk more than a few feet while navigating with a cane and oxygen tank.  She also reports her oxygen level drop whenever she walks.  She was monitored with a pulse oximeter while walking approximately 15 feet and I did not witness any change, she maintained her sats at 92% on 6 liters of oxygen.  She also has an aide that helps her around the house with meals and chores.[name] unable to walk more than 20 feet and her balance is a huge concern in a group exercise setting. I feel pulmonary rehab is not appropriate at this time.  I am referring her to The [facility] Program at the [facility].  I am in hopes that when she completes that program she may be appropriate and safe to exercise with [name].  Safety, weakness, and deconditioning are major concerns at this point. [id number]

## 2017-07-02 NOTE — Telephone Encounter (Signed)
Remo Lipps, RN with Pulmonary Rehab left message the patient was seen today and is not appropriate for the cardiac rehab program due to extensive deconditioning and problems with balance.  Requests patient be referred to Beyond Balance program which would be a better fit.  Remo Lipps can be reached all day on Tuesday at [phone]. She has also entered a note on patient in chart.  Danley Danker, RN [facility] RN)

## 2017-07-03 DIAGNOSIS — F331 Major depressive disorder, recurrent, moderate: Secondary | ICD-10-CM | POA: Diagnosis not present

## 2017-07-03 DIAGNOSIS — F064 Anxiety disorder due to known physiological condition: Secondary | ICD-10-CM | POA: Diagnosis not present

## 2017-07-03 NOTE — Telephone Encounter (Signed)
Remo Lipps called to check status.  Will forward to MD. Daisi Kentner, [name], [place]

## 2017-07-04 DIAGNOSIS — J9611 Chronic respiratory failure with hypoxia: Secondary | ICD-10-CM | POA: Diagnosis not present

## 2017-07-04 NOTE — Telephone Encounter (Signed)
Patient was referred to pulmonary rehab by cardiology. Would have Remo Lipps discuss this referral with them. Thanks Karlene Southard

## 2017-07-05 ENCOUNTER — Other Ambulatory Visit: Payer: Medicare HMO | Admitting: Internal Medicine

## 2017-07-05 ENCOUNTER — Telehealth: Payer: Self-pay

## 2017-07-05 DIAGNOSIS — M109 Gout, unspecified: Secondary | ICD-10-CM

## 2017-07-05 DIAGNOSIS — J9611 Chronic respiratory failure with hypoxia: Secondary | ICD-10-CM | POA: Diagnosis not present

## 2017-07-05 NOTE — Telephone Encounter (Addendum)
LMOVM for joan to contact her cardiologist. Delray Alt, [place]

## 2017-07-05 NOTE — Telephone Encounter (Signed)
Gonzella Lex, Nurse Practitioner with Hospice and Palliative Care has some questions and comments about medications patient is on. Please call.  Call back is [phone]  Danley Danker, RN [facility] [place])

## 2017-07-06 DIAGNOSIS — F330 Major depressive disorder, recurrent, mild: Secondary | ICD-10-CM | POA: Diagnosis not present

## 2017-07-06 DIAGNOSIS — F411 Generalized anxiety disorder: Secondary | ICD-10-CM | POA: Diagnosis not present

## 2017-07-06 DIAGNOSIS — J9611 Chronic respiratory failure with hypoxia: Secondary | ICD-10-CM | POA: Diagnosis not present

## 2017-07-06 NOTE — Telephone Encounter (Signed)
Discussed with nurse, will stop metformin. Patient to follow up on Monday.

## 2017-07-07 DIAGNOSIS — J9611 Chronic respiratory failure with hypoxia: Secondary | ICD-10-CM | POA: Diagnosis not present

## 2017-07-08 DIAGNOSIS — J9611 Chronic respiratory failure with hypoxia: Secondary | ICD-10-CM | POA: Diagnosis not present

## 2017-07-09 ENCOUNTER — Encounter: Payer: Self-pay | Admitting: Internal Medicine

## 2017-07-09 ENCOUNTER — Ambulatory Visit (INDEPENDENT_AMBULATORY_CARE_PROVIDER_SITE_OTHER): Payer: Medicare HMO | Admitting: Internal Medicine

## 2017-07-09 VITALS — BP 130/70 | HR 80 | Temp 97.9°F | Ht 62.0 in | Wt 273.6 lb

## 2017-07-09 DIAGNOSIS — R0602 Shortness of breath: Secondary | ICD-10-CM | POA: Diagnosis not present

## 2017-07-09 DIAGNOSIS — J45909 Unspecified asthma, uncomplicated: Secondary | ICD-10-CM | POA: Diagnosis not present

## 2017-07-09 DIAGNOSIS — J9611 Chronic respiratory failure with hypoxia: Secondary | ICD-10-CM | POA: Diagnosis not present

## 2017-07-09 DIAGNOSIS — M109 Gout, unspecified: Secondary | ICD-10-CM

## 2017-07-09 DIAGNOSIS — E1142 Type 2 diabetes mellitus with diabetic polyneuropathy: Secondary | ICD-10-CM

## 2017-07-09 DIAGNOSIS — I5032 Chronic diastolic (congestive) heart failure: Secondary | ICD-10-CM | POA: Diagnosis not present

## 2017-07-09 DIAGNOSIS — N183 Chronic kidney disease, stage 3 unspecified: Secondary | ICD-10-CM

## 2017-07-09 MED ORDER — GABAPENTIN 600 MG PO TABS: 600.0000 mg | ORAL_TABLET | Freq: Three times a day (TID) | ORAL | 0 refills | Status: DC

## 2017-07-09 MED ORDER — OXYBUTYNIN CHLORIDE ER 10 MG PO TB24: 10.0000 mg | ORAL_TABLET | Freq: Every day | ORAL | 0 refills | Status: DC

## 2017-07-09 MED ORDER — FLUTICASONE FUROATE-VILANTEROL 100-25 MCG/INH IN AEPB: INHALATION_SPRAY | RESPIRATORY_TRACT | 2 refills | Status: DC

## 2017-07-09 MED ORDER — COLCHICINE 0.6 MG PO TABS: 0.6000 mg | ORAL_TABLET | Freq: Every day | ORAL | 0 refills | Status: DC

## 2017-07-09 MED ORDER — ALBUTEROL SULFATE HFA 108 (90 BASE) MCG/ACT IN AERS: 2.0000 | INHALATION_SPRAY | RESPIRATORY_TRACT | 4 refills | Status: DC | PRN

## 2017-07-09 MED ORDER — TIOTROPIUM BROMIDE MONOHYDRATE 18 MCG IN CAPS: 18.0000 ug | ORAL_CAPSULE | Freq: Every day | RESPIRATORY_TRACT | 1 refills | Status: DC

## 2017-07-09 MED ORDER — MONTELUKAST SODIUM 10 MG PO TABS: 10.0000 mg | ORAL_TABLET | Freq: Every day | ORAL | 1 refills | Status: DC

## 2017-07-09 MED ORDER — LORATADINE 10 MG PO TABS: 10.0000 mg | ORAL_TABLET | Freq: Every day | ORAL | 0 refills | Status: DC

## 2017-07-09 NOTE — Patient Instructions (Signed)
Please follow-up in 2 weeks to see how your kidney function is doing.  I will place a referral for cardiology for you to follow-up with Dr. Einar Gip.  We will check your kidney function today.

## 2017-07-09 NOTE — Assessment & Plan Note (Signed)
Patient would like a referral to a different rheumatologist currently on for by Oxistat to decrease uric acid levels patient still has joint pains previously we had discussed using Mobic to help with the joint pains with the knowledge that this could hurt kidney function.  Patient's kidney function is notable for acute kidney injury at last visit.  Therefore will stop Mobic completely and start colchicine

## 2017-07-09 NOTE — Assessment & Plan Note (Signed)
>>  ASSESSMENT AND PLAN FOR STAGE 3B CHRONIC KIDNEY DISEASE ([place]) WRITTEN ON 07/09/2017  2:23 PM BY MIKELL, Jeani Sow, MD  - Discussed cutting back on diuretic - patient is unwilling to do this  - Discussed with patient that mobic as previously discussed is likely causing acute kidney injury - recommended she stop this medication  - Has increased the fluid from 1L to 1.5 L  - Basic Metabolic Panel

## 2017-07-09 NOTE — Assessment & Plan Note (Signed)
-   Discussed cutting back on diuretic - patient is unwilling to do this  - Discussed with patient that mobic as previously discussed is likely causing acute kidney injury - recommended she stop this medication  - Has increased the fluid from 1L to 1.5 L  - Basic Metabolic Panel

## 2017-07-09 NOTE — Progress Notes (Signed)
   [name] [name], MD Phone: [phone]  Reason For Visit: Follow up   #CKD -At last visit notable for acute kidney injury with a increase in her serum creatinine from 1.5 to 2.4. -Called patient and discussed with her that she should decrease her diuretic currently taking torsemide 80 mg twice daily and metolazone on Tuesdays and Thursdays - patient has been taking 1 L of fluid daily has increased her fluid to 1.5 L -She was also taking Mobic discussed that she should stop taking this however she is still currently taking her Mobic.   #Joint Pain  -Patient has been seen by rheumatology who diagnosed her with having gout due to elevated uric acid levels.  Initially she was placed on colchicine which she states made her nauseated she was taking this twice daily.  She was transitioned to Suboxone statin was also given prednisone and tramadol to help with her joint pains.  She was allergic to both prednisone and tramadol.  She has been following with me and had severe joint pains in the past.  I have discussed with her that there are few options for her other than Tylenol  Past Medical History Reviewed problem list.  Medications- reviewed and updated No additions to family history Social history- patient is a non- smoker  Objective: BP 130/70 (BP Location: Left Wrist, Patient Position: Sitting, Cuff Size: Normal)   Pulse 80   Temp 97.9 F (36.6 C) (Oral)   Ht 5\' 2"  (1.575 m)   Wt 273 lb 9.6 oz (124.1 kg)   SpO2 93%   BMI 50.04 kg/m  Gen: NAD, alert, cooperative with exam Cardio: regular rate and rhythm, S1S2 heard, no murmurs appreciated Pulm: clear to auscultation bilaterally, no wheezes, rhonchi or rales Skin: dry, intact, no rashes or lesions Neuro: Strength and sensation grossly intact  Assessment/Plan: See problem based a/p  CKD (chronic kidney disease) - Discussed cutting back on diuretic - patient is unwilling to do this  - Discussed  with patient that mobic as previously discussed is likely causing acute kidney injury - recommended she stop this medication  - Has increased the fluid from 1L to 1.5 L  - Basic Metabolic Panel  Gout Patient would like a referral to a different rheumatologist currently on for by Oxistat to decrease uric acid levels patient still has joint pains previously we had discussed using Mobic to help with the joint pains with the knowledge that this could hurt kidney function.  Patient's kidney function is notable for acute kidney injury at last visit.  Therefore will stop Mobic completely and start colchicine

## 2017-07-10 ENCOUNTER — Other Ambulatory Visit: Payer: Self-pay | Admitting: Internal Medicine

## 2017-07-10 DIAGNOSIS — G4733 Obstructive sleep apnea (adult) (pediatric): Secondary | ICD-10-CM | POA: Diagnosis not present

## 2017-07-10 DIAGNOSIS — J449 Chronic obstructive pulmonary disease, unspecified: Secondary | ICD-10-CM | POA: Diagnosis not present

## 2017-07-10 DIAGNOSIS — J9620 Acute and chronic respiratory failure, unspecified whether with hypoxia or hypercapnia: Secondary | ICD-10-CM | POA: Diagnosis not present

## 2017-07-10 DIAGNOSIS — R062 Wheezing: Secondary | ICD-10-CM | POA: Diagnosis not present

## 2017-07-10 DIAGNOSIS — J9611 Chronic respiratory failure with hypoxia: Secondary | ICD-10-CM | POA: Diagnosis not present

## 2017-07-10 DIAGNOSIS — E1142 Type 2 diabetes mellitus with diabetic polyneuropathy: Secondary | ICD-10-CM

## 2017-07-10 DIAGNOSIS — R0602 Shortness of breath: Secondary | ICD-10-CM | POA: Diagnosis not present

## 2017-07-10 DIAGNOSIS — J440 Chronic obstructive pulmonary disease with acute lower respiratory infection: Secondary | ICD-10-CM | POA: Diagnosis not present

## 2017-07-10 DIAGNOSIS — J189 Pneumonia, unspecified organism: Secondary | ICD-10-CM | POA: Diagnosis not present

## 2017-07-10 DIAGNOSIS — I509 Heart failure, unspecified: Secondary | ICD-10-CM | POA: Diagnosis not present

## 2017-07-10 DIAGNOSIS — J45909 Unspecified asthma, uncomplicated: Secondary | ICD-10-CM | POA: Diagnosis not present

## 2017-07-10 DIAGNOSIS — M6281 Muscle weakness (generalized): Secondary | ICD-10-CM | POA: Diagnosis not present

## 2017-07-10 LAB — BASIC METABOLIC PANEL
BUN/Creatinine Ratio: 12 (ref 9–23)
BUN: 32 mg/dL — ABNORMAL HIGH (ref 6–24)
CO2: 34 mmol/L — ABNORMAL HIGH (ref 20–29)
Calcium: 10.2 mg/dL (ref 8.7–10.2)
Chloride: 90 mmol/L — ABNORMAL LOW (ref 96–106)
Creatinine, Ser: 2.58 mg/dL — ABNORMAL HIGH (ref 0.57–1.00)
GFR calc Af Amer: 23 mL/min/{1.73_m2} — ABNORMAL LOW (ref >59)
GFR calc non Af Amer: 20 mL/min/{1.73_m2} — ABNORMAL LOW (ref >59)
Glucose: 95 mg/dL (ref 65–99)
Potassium: 2.7 mmol/L — ABNORMAL LOW (ref 3.5–5.2)
Sodium: 141 mmol/L (ref 134–144)

## 2017-07-11 ENCOUNTER — Telehealth: Payer: Self-pay | Admitting: Internal Medicine

## 2017-07-11 DIAGNOSIS — M109 Gout, unspecified: Secondary | ICD-10-CM

## 2017-07-11 DIAGNOSIS — I129 Hypertensive chronic kidney disease with stage 1 through stage 4 chronic kidney disease, or unspecified chronic kidney disease: Secondary | ICD-10-CM | POA: Diagnosis not present

## 2017-07-11 DIAGNOSIS — N183 Chronic kidney disease, stage 3 (moderate): Secondary | ICD-10-CM | POA: Diagnosis not present

## 2017-07-11 DIAGNOSIS — E1122 Type 2 diabetes mellitus with diabetic chronic kidney disease: Secondary | ICD-10-CM | POA: Diagnosis not present

## 2017-07-11 DIAGNOSIS — J9611 Chronic respiratory failure with hypoxia: Secondary | ICD-10-CM | POA: Diagnosis not present

## 2017-07-11 MED ORDER — COLCHICINE 0.6 MG PO TABS: 0.6000 mg | ORAL_TABLET | Freq: Every day | ORAL | 0 refills | Status: DC

## 2017-07-11 NOTE — Telephone Encounter (Signed)
Pt would like Dr Emmaline Life to call her. She just left her appointment for her kidneys and has information she would like to share with her.

## 2017-07-12 ENCOUNTER — Telehealth: Payer: Self-pay | Admitting: Internal Medicine

## 2017-07-12 ENCOUNTER — Encounter: Payer: Self-pay | Admitting: Emergency Medicine

## 2017-07-12 ENCOUNTER — Ambulatory Visit (INDEPENDENT_AMBULATORY_CARE_PROVIDER_SITE_OTHER): Payer: Medicare HMO | Admitting: Emergency Medicine

## 2017-07-12 ENCOUNTER — Ambulatory Visit (INDEPENDENT_AMBULATORY_CARE_PROVIDER_SITE_OTHER): Payer: Medicare HMO | Admitting: Licensed Clinical Social Worker

## 2017-07-12 DIAGNOSIS — J9612 Chronic respiratory failure with hypercapnia: Secondary | ICD-10-CM | POA: Diagnosis not present

## 2017-07-12 DIAGNOSIS — I5032 Chronic diastolic (congestive) heart failure: Secondary | ICD-10-CM | POA: Diagnosis not present

## 2017-07-12 DIAGNOSIS — J42 Unspecified chronic bronchitis: Secondary | ICD-10-CM

## 2017-07-12 DIAGNOSIS — J9611 Chronic respiratory failure with hypoxia: Secondary | ICD-10-CM

## 2017-07-12 DIAGNOSIS — F3341 Major depressive disorder, recurrent, in partial remission: Secondary | ICD-10-CM

## 2017-07-12 MED ORDER — FLUTICASONE-UMECLIDIN-VILANT 100-62.5-25 MCG/INH IN AEPB: 1.0000 | INHALATION_SPRAY | Freq: Every day | RESPIRATORY_TRACT | 0 refills | Status: DC

## 2017-07-12 NOTE — Patient Instructions (Addendum)
Please stop Spiriva and Breo for now We will try starting Trelegy one inhalation once a day.  Rinse and gargle after using.  If this medication is more effective than please call our office and we will order through your pharmacy so that we can continue it every day. Please continue your oxygen at 6L/min at rest, increase to 8L/min with walking and exertion.  Please continue to use your Trilogy ventilatory Continue your torsemide, metolazone and amlodipine as ordered by Dr. Haroldine Laws and cardiology. Follow with Dr Lamonte Sakai in 3 months or sooner if you have any problems.

## 2017-07-12 NOTE — Assessment & Plan Note (Signed)
Try converting Breo and Spiriva over to Trelegy.  If she benefits then we will continue.  Continue albuterol as needed.

## 2017-07-12 NOTE — Telephone Encounter (Signed)
Called and discussed patient's BMET results. Potassium is low, therefore patient to restart potassium. Again reiterated stopping any further Mobic use, patient is going to retry the colchicine at a lower dose. Patient is worried about her iron level reassured her that her iron was check in June with no signs of deficiency.

## 2017-07-12 NOTE — Telephone Encounter (Signed)
Opened in error

## 2017-07-12 NOTE — Telephone Encounter (Signed)
I recently discussed patient's iron levels. Her iron levels were checked back in June and she is not iron deficient at all. This is likely a combination of anemia of chronic disease vs. Worsening CKD. Patient would likely benefit from EPO in the near future. I will leave any further discussion to you Dr. Reesa Chew as I spent a significant amount time discussing this with patient over the phone yesterday.

## 2017-07-12 NOTE — Progress Notes (Signed)
Subjective:    Patient ID: Tiffany [name], female    DOB: [birth year], 57 y.o.   MRN: [id number]  HPI  ROV 11/28/16 --57 year old former smoker (20 pk-yrs) with history of schizophrenia, history of substance abuse, hypertension with diastolic CHF, COPD with asthmatic features based on spirometry performed 05/08/16 that showed severe obstruction.  She has obstructive sleep apnea and obesity hypoventilation syndrome.  She was admitted to the hospital 09/2016 and then again 10/2016.  In September she had acute on chronic respiratory failure with decompensated diastolic heart failure, suffered a PEA arrest and required mechanical ventilation.  She was weaned to BiPAP and then transitioned to a Trilogy ventilator. [name] wearing o2 at all times, 6L/min. [name] currently on Breo and Spiriva . She uses albuterol rarely, a few times a month. [name] now wheezing or coughing. She tells me Tiffany [name] has benefited a lot from diet modification, less salt, etc. Eating smaller meals, trying to exercise a little bit. Offered her the flu shot - she declined.   ROV 04/16/17 --this is a follow-up visit for patient with a history of chronic respiratory failure in the setting of diastolic CHF, COPD/asthma with severe obstruction documented on spirometry, obesity hypoventilation syndrome.  She has been treated in the past with BiPAP and is currently on a trilogy ventilator nightly.  [name] tolerating well.  The primary reason for this visit is a mobility evaluation because she is being considered for a motorized wheelchair. No other complaints today, no other changes or issues to address.   ROV 07/12/17 --57 year old woman, former smoker with schizophrenia and history of substance abuse.  She has hypertension with diastolic CHF, COPD with asthmatic features (severe obstruction), obstructive sleep apnea and obesity hypoventilation syndrome on noninvasive ventilation which she uses reliably. She states that she believes that she is having  apneic spell on it.    She underwent a left and right heart catheterization on 06/19/2017 that showed normal coronary arteries and LV function, mild pulmonary hypertension 48/18 (29), PAOP 19.  She has been on stable dose torsemide, metolazone, amlodipine.  [name] currently on 6 L/min at all times. Her exercise tolerance is very poor, she desaturates with almost any ambulation. She went to cardiopulm rehab, but was found to be more appropriate for [facility].     Review of Systems  Constitutional: Positive for unexpected weight change. Negative for fever.  HENT: Positive for dental problem and sinus pressure. Negative for congestion, ear pain, nosebleeds, postnasal drip, rhinorrhea, sneezing, sore throat and trouble swallowing.   Eyes: Negative.  Negative for redness and itching.  Respiratory: Positive for cough, shortness of breath and wheezing. Negative for chest tightness.   Cardiovascular: Positive for leg swelling. Negative for palpitations.  Gastrointestinal: Negative.  Negative for nausea and vomiting.  Endocrine: Negative.   Genitourinary: Negative.  Negative for dysuria.  Musculoskeletal: Positive for joint swelling.  Skin: Negative.  Negative for rash.  Allergic/Immunologic: Negative.   Neurological: Negative.  Negative for headaches.  Hematological: Bruises/bleeds easily.  Psychiatric/Behavioral: Negative.  Negative for dysphoric mood. The patient is not nervous/anxious.       Objective:   Physical Exam Vitals:   07/12/17 1458  BP: 136/78  SpO2: 93%  Weight: 271 lb (122.9 kg)  Height: 5\' 4"  (1.626 m)   Gen: Pleasant, overwt, in no distress,  normal affect  ENT: No lesions,  mouth clear,  oropharynx clear, no postnasal drip  Neck: No JVD, no TMG, no carotid bruits  Lungs: No use of accessory muscles, clear without rales or rhonchi  Cardiovascular: RRR, heart sounds normal, no murmur or gallops, no peripheral edema  Musculoskeletal: No deformities, no cyanosis or  clubbing  Neuro: alert, she has some global weakness, about 4+/5 everywhere except the R LE with is 3/5. She has imbalance when standing or ambulating that place her a a fall risk.   Skin: Warm, no lesions or rashes       Assessment & Plan:  Chronic respiratory failure with hypoxia and hypercapnia (HCC) Chronic hypoxemic respiratory failure.  It appears that she is tolerating the trilogy ventilator and that it is effective.  She desaturates during the day.  [name] on 6 L/min with rest and I believe we need to increase to 8 L/min with exertion.  I explained this to her today.  COPD (chronic obstructive pulmonary disease) (HCC) Try converting Breo and Spiriva over to Trelegy.  If she benefits then we will continue.  Continue albuterol as needed.  Chronic diastolic congestive heart failure ([place]) Continue diuretic regimen as ordered  Baltazar Apo, MD, PhD 07/12/2017, 3:21 PM [facility] [phone] or if no answer [phone]

## 2017-07-12 NOTE — Assessment & Plan Note (Signed)
Continue diuretic regimen as ordered

## 2017-07-12 NOTE — Telephone Encounter (Signed)
Pt said her lung doctor told her that her iron needs to be adjusted up. Please call pt to discuss.

## 2017-07-12 NOTE — Assessment & Plan Note (Signed)
Chronic hypoxemic respiratory failure.  It appears that she is tolerating the trilogy ventilator and that it is effective.  She desaturates during the day.  [name] on 6 L/min with rest and I believe we need to increase to 8 L/min with exertion.  I explained this to her today.

## 2017-07-12 NOTE — Telephone Encounter (Signed)
Patient says she needs her iron to be increased per her Pulmonologist.  Dr. Reesa Chew I was told that you will be her new PCP.  Thanks  .[name], CMA

## 2017-07-12 NOTE — Addendum Note (Signed)
Addended by: Vivia Ewing on: 07/12/2017 04:07 PM   Modules accepted: Orders

## 2017-07-13 DIAGNOSIS — J9611 Chronic respiratory failure with hypoxia: Secondary | ICD-10-CM | POA: Diagnosis not present

## 2017-07-14 DIAGNOSIS — R062 Wheezing: Secondary | ICD-10-CM | POA: Diagnosis not present

## 2017-07-14 DIAGNOSIS — J449 Chronic obstructive pulmonary disease, unspecified: Secondary | ICD-10-CM | POA: Diagnosis not present

## 2017-07-14 DIAGNOSIS — R0602 Shortness of breath: Secondary | ICD-10-CM | POA: Diagnosis not present

## 2017-07-14 DIAGNOSIS — J45909 Unspecified asthma, uncomplicated: Secondary | ICD-10-CM | POA: Diagnosis not present

## 2017-07-14 DIAGNOSIS — J189 Pneumonia, unspecified organism: Secondary | ICD-10-CM | POA: Diagnosis not present

## 2017-07-14 DIAGNOSIS — G4733 Obstructive sleep apnea (adult) (pediatric): Secondary | ICD-10-CM | POA: Diagnosis not present

## 2017-07-14 DIAGNOSIS — M6281 Muscle weakness (generalized): Secondary | ICD-10-CM | POA: Diagnosis not present

## 2017-07-14 DIAGNOSIS — J440 Chronic obstructive pulmonary disease with acute lower respiratory infection: Secondary | ICD-10-CM | POA: Diagnosis not present

## 2017-07-14 DIAGNOSIS — I509 Heart failure, unspecified: Secondary | ICD-10-CM | POA: Diagnosis not present

## 2017-07-14 NOTE — Addendum Note (Signed)
Encounter addended by: Jolaine Artist, MD on: 07/14/2017 12:08 AM  Actions taken: LOS modified

## 2017-07-16 DIAGNOSIS — J9611 Chronic respiratory failure with hypoxia: Secondary | ICD-10-CM | POA: Diagnosis not present

## 2017-07-17 ENCOUNTER — Other Ambulatory Visit: Payer: Medicare HMO | Admitting: Internal Medicine

## 2017-07-17 DIAGNOSIS — N184 Chronic kidney disease, stage 4 (severe): Secondary | ICD-10-CM

## 2017-07-17 DIAGNOSIS — R0602 Shortness of breath: Secondary | ICD-10-CM

## 2017-07-17 DIAGNOSIS — M2550 Pain in unspecified joint: Secondary | ICD-10-CM

## 2017-07-17 DIAGNOSIS — J9611 Chronic respiratory failure with hypoxia: Secondary | ICD-10-CM | POA: Diagnosis not present

## 2017-07-17 NOTE — Progress Notes (Signed)
PALLIATIVE CARE CONSULT VISIT   PATIENT NAME: Tiffany [name] DOB: Jan 16, 1961 MRN: [id number]  PRIMARY CARE PROVIDER:   Lovenia Kim, MD  REFERRING PROVIDER:  Lovenia Kim, MD [place], [place] [address]  RESPONSIBLE PARTY:   Self      RECOMMENDATIONS and PLAN:  1. Shortness of breath R06.02:    Multifactorial and at baseline. Desaturates with exertion.  Continue to use supplemental O2(6L).  More active with motorized chair. Pending exercise program involvement.  2. Generalized joint pain  M25.50:  Multiple joints upper and lower extremities.  Continue current tx. For gout. Pending appt with Rheumatologist.  Dietary restrictions reviewed.  Consider analgesic Rx due to inability to take NSAIDs  3. Chronic kidney disease stage 4  N18.4:  GFR 23 on 6/24.  Progressive decline.  Repeat med/lab review and strongly encouraged to d/c use of Metformin and NSAID as previously discussed. Hydrate within fluid restrictions.  Managed by Nephrologist.   4.  Goals of care:  Continue to live independently at home and avoid rehospitalization.  Palliative care will continue to follow.  I spent 60 minutes providing this consultation,  from 11:00am to 12:00pm at home. More than 50% of the time in this consultation was spent coordinating communication with patient   HISTORY OF PRESENT ILLNESS: Follow-up with Tiffany [name] who complains of continued chronic pain of multiple joints, intermittent shortness of breath which increases with exertion.  No reports of recent hospitalizations.    CODE STATUS: FULL CODE  PPS: 50% HOSPICE ELIGIBILITY/DIAGNOSIS: TBD  PAST MEDICAL HISTORY:  Past Medical History:  Diagnosis Date  . Anxiety   . Arthritis    "all over" (04/10/2016)  . Asthma   . Cardiac arrest ([place]) 09/08/2016   PEA  . Carotid artery stenosis    1-39% bilateral by dopplers 11/2016  . Chronic bronchitis ([place])   . Chronic diastolic (congestive) heart failure ([place])   . Chronic  kidney disease    "I see a kidney dr." (04/10/2016)  . Cocaine abuse ([place])   . Complication of anesthesia    decreased bp, decreased heart rate  . Depression   . Disorder of nervous system   . Emphysema   . GERD (gastroesophageal reflux disease)   . Heart attack ([place]) 1980s  . History of blood transfusion 1994   "couldn't stop bleeding from my period"  . Hyperlipidemia LDL goal <70   . Hypertension   . Incontinence   . Manic depression ([place])   . On home oxygen therapy    "6L; 24/7" (04/10/2016)  . OSA on CPAP    "wear mask sometimes" (04/10/2016)  . Paranoid ([place])    "sometimes; I'm on RX for it" (04/10/2016)  . Pneumonia    "I've had it several times; haven't had it since 06/2015" (04/10/2016)  . Schizophrenia ([place])   . Seasonal allergies   . Seizures ([place])    "don't know what kind; last one was ~ 1 yr ago" (04/10/2016)  . Sinus trouble   . Stroke [facility]) 1980s   denies residual on 04/10/2016  . Type II diabetes mellitus ([place])     SOCIAL HX:  Social History   Tobacco Use  . Smoking status: Former Smoker    Packs/day: 1.50    Years: 38.00    Pack years: 57.00    Types: Cigarettes    Start date: 03/13/1977    Last attempt to quit: 04/10/2016    Years since quitting: 1.2  .  Smokeless tobacco: Never Used  Substance Use Topics  . Alcohol use: No    Alcohol/week: 0.0 oz    ALLERGIES:  Allergies  Allergen Reactions  . Hydrocodone-Acetaminophen Shortness Of Breath  . Hydroxyzine Anaphylaxis and Shortness Of Breath  . Latuda [Lurasidone Hcl] Anaphylaxis  . Prednisone Anaphylaxis, Swelling and Other (See Comments)    Tongue swelling  . Tramadol Anaphylaxis  . Codeine Nausea And Vomiting  . Sulfa Antibiotics Itching  . Tape Rash     PERTINENT MEDICATIONS:  Outpatient Encounter Medications as of 07/17/2017  Medication Sig  . acetaminophen (TYLENOL) 500 MG tablet Take 1 tablet (500 mg total) by mouth every 6 (six) hours as needed.  [name] albuterol (PROVENTIL HFA;VENTOLIN HFA)  108 (90 Base) MCG/ACT inhaler Inhale 2 puffs into the lungs every 4 (four) hours as needed for wheezing or shortness of breath.  [name] albuterol (PROVENTIL) (2.5 MG/3ML) 0.083% nebulizer solution Take 3 mLs (2.5 mg total) by nebulization every 6 (six) hours as needed for wheezing or shortness of breath. (Patient taking differently: Take 2.5 mg by nebulization every 4 (four) hours as needed for wheezing or shortness of breath. )  . amLODipine (NORVASC) 5 MG tablet TAKE 1 TABLET (5 MG TOTAL) BY MOUTH DAILY. (MORNING)  . atorvastatin (LIPITOR) 80 MG tablet Take 1 tablet (80 mg total) by mouth daily.  . chlorhexidine (PERIDEX) 0.12 % solution Use as directed 15 mLs in the mouth or throat See admin instructions. Swish 1/2 oz (15 mls) for 30 seconds and then spit - twice daily  . colchicine 0.6 MG tablet Take 1 tablet (0.6 mg total) by mouth daily.  . diazepam (VALIUM) 5 MG tablet Take 5 mg by mouth 3 (three) times daily.   . Diclofenac Sodium 3 % GEL Apply 1 application topically daily as needed (pain). Use on area of the pain  . escitalopram (LEXAPRO) 20 MG tablet Take 1 tablet (20 mg total) by mouth daily. (Patient taking differently: Take 40 mg by mouth daily. )  . Febuxostat 80 MG TABS Take 1 tablet (80 mg total) by mouth daily.  . ferrous sulfate 325 (65 FE) MG tablet Take 1 tablet (325 mg total) by mouth daily.  . flunisolide (NASALIDE) 25 MCG/ACT (0.025%) SOLN Place 2 sprays into the nose daily.  . fluticasone furoate-vilanterol (BREO ELLIPTA) 100-25 MCG/INH AEPB INHALE 1 PUFF EVERY DAY  . Fluticasone-Umeclidin-Vilant (TRELEGY ELLIPTA) 100-62.5-25 MCG/INH AEPB Inhale 1 puff into the lungs daily.  [name] gabapentin (NEURONTIN) 600 MG tablet Take 1 tablet (600 mg total) by mouth 3 (three) times daily.  [name] gabapentin (NEURONTIN) 600 MG tablet TAKE 1 TAB BY MOUTH 3 TIMES DAILY. ( MORNING,NOON,BEDTIME)  . GOODSENSE ASPIRIN LOW DOSE 81 MG EC tablet TAKE 1 TABLET (81 MG TOTAL) BY MOUTH DAILY (MORNING).  [name]  lisdexamfetamine (VYVANSE) 40 MG capsule Take 60 mg by mouth daily.   [name] loratadine (CLARITIN) 10 MG tablet Take 1 tablet (10 mg total) by mouth daily.  . metolazone (ZAROXOLYN) 2.5 MG tablet Take 1 tablet (2.5 mg total) by mouth every Monday, Wednesday, and Friday. Take 30 mins prior to taking Torsemide  . montelukast (SINGULAIR) 10 MG tablet Take 1 tablet (10 mg total) by mouth at bedtime.  . Multiple Vitamin (MULTIVITAMIN WITH MINERALS) TABS tablet Take 1 tablet by mouth daily.  [name] omeprazole (PRILOSEC) 40 MG capsule Take 1 capsule (40 mg total) by mouth 2 (two) times daily.  [name] oxybutynin (DITROPAN-XL) 10 MG 24 hr tablet Take 1 tablet (10 mg  total) by mouth at bedtime.  . OXYGEN Inhale 6 L into the lungs continuous.   . potassium chloride (K-DUR) 10 MEQ tablet Take 4 tablets (40 mEq total) by mouth 3 (three) times daily. May also take 4 tablets (40 mEq total) at bedtime as needed ([place] Metolazone on M/W/F).  [name] sodium chloride (OCEAN) 0.65 % SOLN nasal spray Place 2 sprays into both nostrils 4 (four) times daily.  [name] tiotropium (SPIRIVA HANDIHALER) 18 MCG inhalation capsule Place 1 capsule (18 mcg total) into inhaler and inhale daily.  [name] torsemide (DEMADEX) 20 MG tablet Take 4 tablets (80 mg total) by mouth 2 (two) times daily.  . traZODone (DESYREL) 100 MG tablet 50 mg in the AM and 150 mg at night   No facility-administered encounter medications on file as of 07/17/2017.     PHYSICAL EXAM:   General: Obese female resting in motorized chair.  In NAD Cardiovascular: regular rate and rhythm Pulmonary: clear throughout.  O2 via [place] in use at 6L Abdomen: round,soft, nontender, + bowel sounds Extremities: no edema or ecchymosis Skin: Exposed skin is intact Neurological: Weakness, A&O x3 Psych:  Appropriate mood.  Calm and cooperative  Gonzella Lex, NP-C

## 2017-07-18 DIAGNOSIS — J9611 Chronic respiratory failure with hypoxia: Secondary | ICD-10-CM | POA: Diagnosis not present

## 2017-07-18 DIAGNOSIS — M2550 Pain in unspecified joint: Secondary | ICD-10-CM | POA: Insufficient documentation

## 2017-07-19 DIAGNOSIS — J9611 Chronic respiratory failure with hypoxia: Secondary | ICD-10-CM | POA: Diagnosis not present

## 2017-07-20 ENCOUNTER — Telehealth: Payer: Self-pay | Admitting: *Deleted

## 2017-07-20 DIAGNOSIS — F064 Anxiety disorder due to known physiological condition: Secondary | ICD-10-CM | POA: Diagnosis not present

## 2017-07-20 DIAGNOSIS — F330 Major depressive disorder, recurrent, mild: Secondary | ICD-10-CM | POA: Diagnosis not present

## 2017-07-20 DIAGNOSIS — F5081 Binge eating disorder: Secondary | ICD-10-CM | POA: Diagnosis not present

## 2017-07-20 DIAGNOSIS — J9611 Chronic respiratory failure with hypoxia: Secondary | ICD-10-CM | POA: Diagnosis not present

## 2017-07-20 NOTE — Telephone Encounter (Signed)
Pt states that the new bladder medication, oxybutynin, is not working.  States that she is wetting herself 5-6 times a day.  She states that the 'last medicine would not let me pee at all, but this one makes me pee all the time".  She want to be on a different medication. [place], [name], CMA

## 2017-07-21 DIAGNOSIS — J189 Pneumonia, unspecified organism: Secondary | ICD-10-CM | POA: Diagnosis not present

## 2017-07-21 DIAGNOSIS — G4733 Obstructive sleep apnea (adult) (pediatric): Secondary | ICD-10-CM | POA: Diagnosis not present

## 2017-07-21 DIAGNOSIS — M6281 Muscle weakness (generalized): Secondary | ICD-10-CM | POA: Diagnosis not present

## 2017-07-21 DIAGNOSIS — J449 Chronic obstructive pulmonary disease, unspecified: Secondary | ICD-10-CM | POA: Diagnosis not present

## 2017-07-21 DIAGNOSIS — I509 Heart failure, unspecified: Secondary | ICD-10-CM | POA: Diagnosis not present

## 2017-07-21 DIAGNOSIS — J9611 Chronic respiratory failure with hypoxia: Secondary | ICD-10-CM | POA: Diagnosis not present

## 2017-07-21 DIAGNOSIS — R0602 Shortness of breath: Secondary | ICD-10-CM | POA: Diagnosis not present

## 2017-07-21 DIAGNOSIS — R062 Wheezing: Secondary | ICD-10-CM | POA: Diagnosis not present

## 2017-07-21 DIAGNOSIS — J440 Chronic obstructive pulmonary disease with acute lower respiratory infection: Secondary | ICD-10-CM | POA: Diagnosis not present

## 2017-07-21 DIAGNOSIS — J45909 Unspecified asthma, uncomplicated: Secondary | ICD-10-CM | POA: Diagnosis not present

## 2017-07-22 DIAGNOSIS — J9611 Chronic respiratory failure with hypoxia: Secondary | ICD-10-CM | POA: Diagnosis not present

## 2017-07-23 ENCOUNTER — Ambulatory Visit (INDEPENDENT_AMBULATORY_CARE_PROVIDER_SITE_OTHER): Payer: Medicare HMO | Admitting: Family Medicine

## 2017-07-23 ENCOUNTER — Encounter: Payer: Self-pay | Admitting: Family Medicine

## 2017-07-23 ENCOUNTER — Other Ambulatory Visit: Payer: Self-pay

## 2017-07-23 VITALS — BP 124/80 | HR 84 | Temp 98.1°F

## 2017-07-23 DIAGNOSIS — N179 Acute kidney failure, unspecified: Secondary | ICD-10-CM | POA: Diagnosis not present

## 2017-07-23 DIAGNOSIS — E875 Hyperkalemia: Secondary | ICD-10-CM

## 2017-07-23 DIAGNOSIS — J9611 Chronic respiratory failure with hypoxia: Secondary | ICD-10-CM | POA: Diagnosis not present

## 2017-07-23 DIAGNOSIS — E118 Type 2 diabetes mellitus with unspecified complications: Secondary | ICD-10-CM | POA: Diagnosis not present

## 2017-07-23 DIAGNOSIS — M109 Gout, unspecified: Secondary | ICD-10-CM

## 2017-07-23 DIAGNOSIS — N3281 Overactive bladder: Secondary | ICD-10-CM | POA: Diagnosis not present

## 2017-07-23 MED ORDER — OXYBUTYNIN CHLORIDE ER 10 MG PO TB24: 10.0000 mg | ORAL_TABLET | Freq: Every day | ORAL | 0 refills | Status: DC

## 2017-07-23 MED ORDER — COLCHICINE 0.6 MG PO TABS: 0.6000 mg | ORAL_TABLET | Freq: Every day | ORAL | 0 refills | Status: DC

## 2017-07-23 MED ORDER — MIRABEGRON ER 25 MG PO TB24: 25.0000 mg | ORAL_TABLET | Freq: Every day | ORAL | 3 refills | Status: DC

## 2017-07-23 MED ORDER — COLCHICINE 0.6 MG PO TABS: 0.6000 mg | ORAL_TABLET | Freq: Two times a day (BID) | ORAL | 0 refills | Status: DC

## 2017-07-23 NOTE — Progress Notes (Signed)
Medical Nutrition Therapy:  Appt start time: 1130 end time:  1230. PCP: Lovenia Kim, MD Psychiatrist; Patriciaann Clan, MD Son [name] (lives with pt)  Assessment:  Primary concerns today: Weight management and Blood sugar control.  Ms. Bonk is still weighing herself regularly at home.  She has also been seeing a counselor named Rollene Fare to work on binge eating.  Still taking Vyvanse to help control appetite.   Ms Riccardi is hoping to get into the pulmonary rehab program, but is not approved as yet b/c of her use of a wheelchair. She will instead be enrolling in a pre-pulmonary rehab program called [facility], which she hopes to be starting this week.      Bellany is not checking BG since getting off Metformin, although she would like to get a new meter to be able to check.   Her "bladder doctor" told her to drink no more than 1 liter of fluids/day.  [name] still not drinking soda, and said she is no longer drinking fruit juice, although yesterday's intake included a cup of apple juice.    Schae had a falling out with her mom, which prompted some binge-eating episodes.  She feels she has this in better control now.  Weight is stable since last MNT appt on June 11.     Although Ms. Skowronek acknowledges that vegetables are "good for you," she expressed resistance to incorporating them to her diet b/c of difficulty in both preparing and eating them (poor dentition).  She insisted, however that she needs to keep working on her nutrition, asking for a follow-up appt as soon as 3 weeks (no appt available before 4 weeks).    24-hr recall:  (Up at 9 AM) B (10 AM)-   Snk (10:30)-  5-6 oz oysters, 4 Ritz crackers, 16 oz water L ( PM)-  --- Snk ( PM)-  --- D (6:30 PM)-  5-6 oz oysters, 4 Ritz crackers, 8 oz apple juice  Snk ( PM)-  --- To bed ~ 10:30 PM Typical day? No.  Intake is erratic.  [place] has a new aide, but she doesn't really know how to cook.  Annelyse Is trying to get a new aide who can cook.     Progress Towards Goal(s):  In progress.   Nutritional Diagnosis:  NB-1.1 Food and nutrition-related knowledge deficit As related to weight management and BG control.  As evidenced by patient acknowledgement.    Intervention:  Nutrition education.  Handouts given during visit include:  AVS (large print)  Demonstrated degree of understanding via:  Teach Back  Multiple barriers to learning/adherence to lifestyle change:   - Knowledge deficit and food insecurity  - Inability/disinterest in food preparation, and currently no aide to help with this  - No/limited dentition   Monitoring/Evaluation:  Dietary intake, exercise, and body weight in 4 week(s).

## 2017-07-23 NOTE — Patient Instructions (Signed)
I have changed your urinary incontinence medication to Myrbetriq.  Please start taking this instead of the Oxybutynin as you feel this has not been working for you.  I have sent in your new prescription to your pharmacy and you can pick this up.   I have also checked your potassium today and will let you know what it is.  Continue taking the potassium tablets you have at home.

## 2017-07-23 NOTE — Patient Instructions (Addendum)
-   Ways of eating that will help you get healthier:   - Limit fluid intake to 1 liter per day.    - Limit fluids to water or non-sweet drinks only.  (This means no fruit juice usually.)  - Eat 3 meals and at least 1 snack per day.    - Include some vegetables every day.  - Include some fruit every day.    - Include some protein at every meal.   - When you get your new aide, ask her to:  - make a vegetable soup, some of which you can freeze.             - not add salt to foods  - use any cooking methods other than frying.    - at each meal, include a source of protein, some (but not too much) starch, and some vegetables for lunch & dinner.   - complete the meal planning form provided today, and KEEP IT SOMEPLACE where you know where it is.

## 2017-07-23 NOTE — Progress Notes (Signed)
Subjective:   Patient ID: Tiffany [name]    DOB: 1960/12/28, 57 y.o. female   MRN: [id number]  CC: gout, urinary frequency, concern about potassium  HPI: Tiffany [name] is a 57 y.o. female who presents to clinic today for the following issues.  Gout States her whole body is hurting.  Patient has been taking Colchicine 0.6 mg everyday.  She reports she has stopped taking Mobic as requested by her previous doctor due to acute kidney injury.  She also was previously taking Allopurinol but stopped as she feels this was not working.   No fevers, chills, nausea or vomiting.  She requests increasing her dose of Colchicine.  [name] also taking 80 mg Uric acid daily which she feels helps.    Urinary frequency Patient reports she is taking oxybutynin which was started by [facility] on 6/24.  She reports ever since then she can't hold her urine and is frequently incontinent.  She would like to try a different medication.  She denies burning with urination.    Hypokalemia Pt with K+ 6.2 on 6/4, followed by 2.7 on recent labs from 6/24.  She has potassium tablets at home and has been taking these.  Wants to check her potassium again today to make sure this is getting better.   ROS: No fever, chills, nausea, vomiting.  No abdominal pain, diarrhea.  +urinary incontinence  Social: pt is a former smoker, quit 2017 Medications reviewed. Objective:   BP 124/80   Pulse 84   Temp 98.1 F (36.7 C) (Oral)   SpO2 98%  Vitals and nursing note reviewed.  General: 57 yo f, NAD, chronically ill-appearing  HEENT: NCAT, EOMI, PERRL, MMM, o/p clear Neck: supple,  CV: regular rate and rhythm without murmurs rubs or gallops Lungs: clear to auscultation bilaterally with normal work of breathing Abdomen: soft, NTND. +bs  Skin: warm, dry, no rash  Extremities: warm and well perfused, normal tone Psych: irritable   Assessment & Plan:     Gout Stable, increase colchicine to 0.6 mg BID.   Hyperkalemia Check  BMET for potassium as she was hypokalemic on labs from 6/24. She has been taking K+ tablets at home.   AKI (acute kidney injury) ([place]) Will check a BMET today to ensure this is resolving.  Per chart review, her SCr was 2.58 on 6/24, up from 2.35 two weeks prior to that.  Overactive bladder Reports worsening urinary incontinence with Myrbetriq, requests changing to another medication.  Will trial oxybutynin.  -Rx: oxybutynin -consider referral to urology if no improvement   Orders Placed This Encounter  Procedures  . Basic metabolic panel   Meds ordered this encounter  Medications  . DISCONTD: colchicine 0.6 MG tablet    Sig: Take 1 tablet (0.6 mg total) by mouth daily.    Dispense:  30 tablet    Refill:  0  . colchicine 0.6 MG tablet    Sig: Take 1 tablet (0.6 mg total) by mouth 2 (two) times daily.    Dispense:  60 tablet    Refill:  0  . oxybutynin (DITROPAN-XL) 10 MG 24 hr tablet    Sig: Take 1 tablet (10 mg total) by mouth at bedtime.    Dispense:  30 tablet    Refill:  0  . mirabegron ER (MYRBETRIQ) 25 MG TB24 tablet    Sig: Take 1 tablet (25 mg total) by mouth daily.    Dispense:  30 tablet    Refill:  3  Lovenia Kim, MD [place], PGY-2 08/02/2017 11:51 AM

## 2017-07-24 DIAGNOSIS — J440 Chronic obstructive pulmonary disease with acute lower respiratory infection: Secondary | ICD-10-CM | POA: Diagnosis not present

## 2017-07-24 DIAGNOSIS — R0602 Shortness of breath: Secondary | ICD-10-CM | POA: Diagnosis not present

## 2017-07-24 DIAGNOSIS — R062 Wheezing: Secondary | ICD-10-CM | POA: Diagnosis not present

## 2017-07-24 DIAGNOSIS — J9611 Chronic respiratory failure with hypoxia: Secondary | ICD-10-CM | POA: Diagnosis not present

## 2017-07-24 DIAGNOSIS — J189 Pneumonia, unspecified organism: Secondary | ICD-10-CM | POA: Diagnosis not present

## 2017-07-24 DIAGNOSIS — J45909 Unspecified asthma, uncomplicated: Secondary | ICD-10-CM | POA: Diagnosis not present

## 2017-07-24 DIAGNOSIS — G4733 Obstructive sleep apnea (adult) (pediatric): Secondary | ICD-10-CM | POA: Diagnosis not present

## 2017-07-24 LAB — BASIC METABOLIC PANEL
BUN/Creatinine Ratio: 11 (ref 9–23)
BUN: 18 mg/dL (ref 6–24)
CO2: 20 mmol/L (ref 20–29)
Calcium: 9.7 mg/dL (ref 8.7–10.2)
Chloride: 103 mmol/L (ref 96–106)
Creatinine, Ser: 1.63 mg/dL — ABNORMAL HIGH (ref 0.57–1.00)
GFR calc Af Amer: 40 mL/min/{1.73_m2} — ABNORMAL LOW (ref >59)
GFR calc non Af Amer: 34 mL/min/{1.73_m2} — ABNORMAL LOW (ref >59)
Glucose: 82 mg/dL (ref 65–99)
Potassium: 4.7 mmol/L (ref 3.5–5.2)
Sodium: 148 mmol/L — ABNORMAL HIGH (ref 134–144)

## 2017-07-24 NOTE — Telephone Encounter (Signed)
Was discussed @ appt on 07/23/17. Tashika Goodin, [name], CMA

## 2017-07-25 ENCOUNTER — Telehealth: Payer: Self-pay | Admitting: Family Medicine

## 2017-07-25 DIAGNOSIS — J9611 Chronic respiratory failure with hypoxia: Secondary | ICD-10-CM | POA: Diagnosis not present

## 2017-07-25 NOTE — Telephone Encounter (Signed)
Pt asked that Shelly call her back to discuss her pcp change form she turned in on Monday. Please advise

## 2017-07-25 NOTE — Telephone Encounter (Signed)
Called patient and informed her that Tomasa Hosteller has the paperwork requesting a change of PCP and that I am not authorized to change it for her. I advised her to give Tomasa Hosteller a chance to call her to discuss the matter.  Tiffany [name], [place]

## 2017-07-26 ENCOUNTER — Telehealth: Payer: Self-pay | Admitting: Emergency Medicine

## 2017-07-26 DIAGNOSIS — J9611 Chronic respiratory failure with hypoxia: Secondary | ICD-10-CM | POA: Diagnosis not present

## 2017-07-26 DIAGNOSIS — J42 Unspecified chronic bronchitis: Secondary | ICD-10-CM

## 2017-07-26 MED ORDER — FLUTICASONE-UMECLIDIN-VILANT 100-62.5-25 MCG/INH IN AEPB: 1.0000 | INHALATION_SPRAY | Freq: Every day | RESPIRATORY_TRACT | 1 refills | Status: DC

## 2017-07-26 NOTE — Telephone Encounter (Signed)
Spoke with pt. [name] needing a prescription for Trelegy. Rx has been sent in. Nothing further was needed.

## 2017-07-27 ENCOUNTER — Other Ambulatory Visit: Payer: Self-pay | Admitting: Internal Medicine

## 2017-07-27 DIAGNOSIS — F064 Anxiety disorder due to known physiological condition: Secondary | ICD-10-CM | POA: Diagnosis not present

## 2017-07-27 DIAGNOSIS — F331 Major depressive disorder, recurrent, moderate: Secondary | ICD-10-CM | POA: Diagnosis not present

## 2017-07-27 DIAGNOSIS — J9611 Chronic respiratory failure with hypoxia: Secondary | ICD-10-CM | POA: Diagnosis not present

## 2017-07-27 DIAGNOSIS — D649 Anemia, unspecified: Secondary | ICD-10-CM

## 2017-07-28 ENCOUNTER — Other Ambulatory Visit: Payer: Self-pay | Admitting: Physician Assistant

## 2017-07-28 DIAGNOSIS — J9611 Chronic respiratory failure with hypoxia: Secondary | ICD-10-CM | POA: Diagnosis not present

## 2017-07-29 DIAGNOSIS — J9611 Chronic respiratory failure with hypoxia: Secondary | ICD-10-CM | POA: Diagnosis not present

## 2017-07-30 DIAGNOSIS — J9611 Chronic respiratory failure with hypoxia: Secondary | ICD-10-CM | POA: Diagnosis not present

## 2017-07-30 NOTE — Telephone Encounter (Signed)
This is a CHF pt 

## 2017-07-31 ENCOUNTER — Other Ambulatory Visit: Payer: Self-pay | Admitting: Internal Medicine

## 2017-08-01 ENCOUNTER — Encounter: Payer: Self-pay | Admitting: Cardiology

## 2017-08-01 DIAGNOSIS — M109 Gout, unspecified: Secondary | ICD-10-CM | POA: Diagnosis not present

## 2017-08-01 DIAGNOSIS — I5032 Chronic diastolic (congestive) heart failure: Secondary | ICD-10-CM | POA: Diagnosis not present

## 2017-08-01 DIAGNOSIS — M2550 Pain in unspecified joint: Secondary | ICD-10-CM | POA: Diagnosis not present

## 2017-08-01 DIAGNOSIS — M6281 Muscle weakness (generalized): Secondary | ICD-10-CM | POA: Diagnosis not present

## 2017-08-01 DIAGNOSIS — J189 Pneumonia, unspecified organism: Secondary | ICD-10-CM | POA: Diagnosis not present

## 2017-08-01 DIAGNOSIS — M150 Primary generalized (osteo)arthritis: Secondary | ICD-10-CM | POA: Diagnosis not present

## 2017-08-01 DIAGNOSIS — Z0189 Encounter for other specified special examinations: Secondary | ICD-10-CM | POA: Diagnosis not present

## 2017-08-01 DIAGNOSIS — I509 Heart failure, unspecified: Secondary | ICD-10-CM | POA: Diagnosis not present

## 2017-08-01 DIAGNOSIS — J45909 Unspecified asthma, uncomplicated: Secondary | ICD-10-CM | POA: Diagnosis not present

## 2017-08-01 DIAGNOSIS — Z6841 Body Mass Index (BMI) 40.0 and over, adult: Secondary | ICD-10-CM | POA: Diagnosis not present

## 2017-08-01 DIAGNOSIS — R062 Wheezing: Secondary | ICD-10-CM | POA: Diagnosis not present

## 2017-08-01 DIAGNOSIS — F5081 Binge eating disorder: Secondary | ICD-10-CM | POA: Insufficient documentation

## 2017-08-01 DIAGNOSIS — J9611 Chronic respiratory failure with hypoxia: Secondary | ICD-10-CM | POA: Diagnosis not present

## 2017-08-01 DIAGNOSIS — R0602 Shortness of breath: Secondary | ICD-10-CM | POA: Diagnosis not present

## 2017-08-01 DIAGNOSIS — G4733 Obstructive sleep apnea (adult) (pediatric): Secondary | ICD-10-CM | POA: Diagnosis not present

## 2017-08-01 DIAGNOSIS — F50819 Binge eating disorder, unspecified: Secondary | ICD-10-CM | POA: Insufficient documentation

## 2017-08-01 DIAGNOSIS — R5383 Other fatigue: Secondary | ICD-10-CM | POA: Diagnosis not present

## 2017-08-01 DIAGNOSIS — J440 Chronic obstructive pulmonary disease with acute lower respiratory infection: Secondary | ICD-10-CM | POA: Diagnosis not present

## 2017-08-01 DIAGNOSIS — D508 Other iron deficiency anemias: Secondary | ICD-10-CM | POA: Diagnosis not present

## 2017-08-01 DIAGNOSIS — J449 Chronic obstructive pulmonary disease, unspecified: Secondary | ICD-10-CM | POA: Diagnosis not present

## 2017-08-02 ENCOUNTER — Other Ambulatory Visit: Payer: Self-pay | Admitting: Internal Medicine

## 2017-08-02 ENCOUNTER — Other Ambulatory Visit: Payer: Medicare HMO | Admitting: Internal Medicine

## 2017-08-02 ENCOUNTER — Ambulatory Visit (INDEPENDENT_AMBULATORY_CARE_PROVIDER_SITE_OTHER): Payer: Medicare HMO | Admitting: Licensed Clinical Social Worker

## 2017-08-02 ENCOUNTER — Telehealth: Payer: Self-pay | Admitting: Emergency Medicine

## 2017-08-02 DIAGNOSIS — M109 Gout, unspecified: Secondary | ICD-10-CM

## 2017-08-02 DIAGNOSIS — F3341 Major depressive disorder, recurrent, in partial remission: Secondary | ICD-10-CM | POA: Diagnosis not present

## 2017-08-02 NOTE — Assessment & Plan Note (Addendum)
Check BMET for potassium as she was hypokalemic on labs from 6/24. She has been taking K+ tablets at home.

## 2017-08-02 NOTE — Assessment & Plan Note (Signed)
Stable, increase colchicine to 0.6 mg BID.

## 2017-08-02 NOTE — Assessment & Plan Note (Signed)
Will check a BMET today to ensure this is resolving.  Per chart review, her SCr was 2.58 on 6/24, up from 2.35 two weeks prior to that.

## 2017-08-02 NOTE — Assessment & Plan Note (Signed)
Reports worsening urinary incontinence with Myrbetriq, requests changing to another medication.  Will trial oxybutynin.  -Rx: oxybutynin -consider referral to urology if no improvement

## 2017-08-02 NOTE — Telephone Encounter (Signed)
Spoke with Gonzella Lex, NP. States that pt went to pulmonary rehab and instructed to follow up with a BOOST program. Enid Derry wanted more information on this program. Alvina Chou that I did not know of a program by this name. [name] going to contact the pulmonary rehab department to see if they can give her information. Nothing further was needed.

## 2017-08-03 ENCOUNTER — Telehealth: Payer: Self-pay | Admitting: Emergency Medicine

## 2017-08-03 DIAGNOSIS — F330 Major depressive disorder, recurrent, mild: Secondary | ICD-10-CM | POA: Diagnosis not present

## 2017-08-03 DIAGNOSIS — F411 Generalized anxiety disorder: Secondary | ICD-10-CM | POA: Diagnosis not present

## 2017-08-03 DIAGNOSIS — J42 Unspecified chronic bronchitis: Secondary | ICD-10-CM

## 2017-08-03 NOTE — Telephone Encounter (Signed)
Called and spoke with pt regarding her O2 order from Last OV in 07-12-17  Pt is requesting to send new O2 order to [facility] today for her newest O2 sats Last OV pt should remain on 6 liters con't at rest, and 8 liters con't with exertion- listed below Placed order today to [facility], DME Nothing further needed at this time.   2. Collene Gobble, MD (Physician) at 07/12/2017 3:15 PM - Signed     Please stop Spiriva and Breo for now We will try starting Trelegy one inhalation once a day.  Rinse and gargle after using.  If this medication is more effective than please call our office and we will order through your pharmacy so that we can continue it every day. Please continue your oxygen at 6L/min at rest, increase to 8L/min with walking and exertion.  Please continue to use your Trilogy ventilatory Continue your torsemide, metolazone and amlodipine as ordered by Dr. Haroldine Laws and cardiology. Follow with Dr Lamonte Sakai in 3 months or sooner if you have any problems.

## 2017-08-06 DIAGNOSIS — M1990 Unspecified osteoarthritis, unspecified site: Secondary | ICD-10-CM | POA: Diagnosis not present

## 2017-08-06 DIAGNOSIS — F064 Anxiety disorder due to known physiological condition: Secondary | ICD-10-CM | POA: Diagnosis not present

## 2017-08-06 DIAGNOSIS — J9611 Chronic respiratory failure with hypoxia: Secondary | ICD-10-CM | POA: Diagnosis not present

## 2017-08-06 DIAGNOSIS — F331 Major depressive disorder, recurrent, moderate: Secondary | ICD-10-CM | POA: Diagnosis not present

## 2017-08-06 DIAGNOSIS — I5032 Chronic diastolic (congestive) heart failure: Secondary | ICD-10-CM | POA: Diagnosis not present

## 2017-08-07 ENCOUNTER — Ambulatory Visit (INDEPENDENT_AMBULATORY_CARE_PROVIDER_SITE_OTHER): Payer: Medicare HMO | Admitting: Emergency Medicine

## 2017-08-07 ENCOUNTER — Encounter: Payer: Self-pay | Admitting: Emergency Medicine

## 2017-08-07 DIAGNOSIS — J42 Unspecified chronic bronchitis: Secondary | ICD-10-CM | POA: Diagnosis not present

## 2017-08-07 DIAGNOSIS — I5032 Chronic diastolic (congestive) heart failure: Secondary | ICD-10-CM | POA: Diagnosis not present

## 2017-08-07 DIAGNOSIS — J9611 Chronic respiratory failure with hypoxia: Secondary | ICD-10-CM | POA: Diagnosis not present

## 2017-08-07 DIAGNOSIS — M1990 Unspecified osteoarthritis, unspecified site: Secondary | ICD-10-CM | POA: Diagnosis not present

## 2017-08-07 DIAGNOSIS — J9612 Chronic respiratory failure with hypercapnia: Secondary | ICD-10-CM | POA: Diagnosis not present

## 2017-08-07 MED ORDER — GUAIFENESIN 200 MG/10ML PO SOLN: 400.0000 mg | Freq: Two times a day (BID) | ORAL | 1 refills | Status: DC

## 2017-08-07 NOTE — Assessment & Plan Note (Addendum)
We will continue Trelegy 1 inhalation once a day.  Remember to rinse and gargle after using this medication.  This medicine will replace your Breo and Spiriva. Continue your Ventolin (albuterol) 2 puffs if needed for shortness of breath, chest tightness, wheezing, mucus clearance. Guaifenesin's for secretion clearance

## 2017-08-07 NOTE — Assessment & Plan Note (Signed)
Good compliance with trilogy vent at home. Some witnessed apneas noted - has a rate on the vent

## 2017-08-07 NOTE — Assessment & Plan Note (Signed)
Order written to arrange for continuous oxygen 6 L/min at rest, 8 L/min with exertion. Continue to use your trilogy ventilator every night as you have been using it. We will investigate options for Cardiopulmonary rehab at [facility]. We will inquire regarding the Boost program that you have referenced.  Follow with Dr Lamonte Sakai in 4 months or sooner if you have any problems.

## 2017-08-07 NOTE — Patient Instructions (Signed)
Order written to arrange for continuous oxygen 6 L/min at rest, 8 L/min with exertion. We will continue Trelegy 1 inhalation once a day.  Remember to rinse and gargle after using this medication.  This medicine will replace your Breo and Spiriva. Please start using guaifenesin 600 mg twice a day (generic Mucinex) Continue Singulair and loratadine as you have been taking them. Continue your Ventolin (albuterol) 2 puffs if needed for shortness of breath, chest tightness, wheezing, mucus clearance. Continue to use your trilogy ventilator every night as you have been using it. We will investigate options for Cardiopulmonary rehab at [facility]. We will inquire regarding the Boost program that you have referenced.  Follow with Dr Lamonte Sakai in 4 months or sooner if you have any problems.

## 2017-08-07 NOTE — Progress Notes (Signed)
Subjective:    Patient ID: Tiffany [name], female    DOB: 1960/06/18, 57 y.o.   MRN: [id number]  HPI  ROV 11/28/16 --57 year old former smoker (20 pk-yrs) with history of schizophrenia, history of substance abuse, hypertension with diastolic CHF, COPD with asthmatic features based on spirometry performed 05/08/16 that showed severe obstruction.  She has obstructive sleep apnea and obesity hypoventilation syndrome.  She was admitted to the hospital 09/2016 and then again 10/2016.  In September she had acute on chronic respiratory failure with decompensated diastolic heart failure, suffered a PEA arrest and required mechanical ventilation.  She was weaned to BiPAP and then transitioned to a Trilogy ventilator. [name] wearing o2 at all times, 6L/min. [name] currently on Breo and Spiriva . She uses albuterol rarely, a few times a month. [name] now wheezing or coughing. She tells me Tiffany [name] has benefited a lot from diet modification, less salt, etc. Eating smaller meals, trying to exercise a little bit. Offered her the flu shot - she declined.   ROV 04/16/17 --this is a follow-up visit for patient with a history of chronic respiratory failure in the setting of diastolic CHF, COPD/asthma with severe obstruction documented on spirometry, obesity hypoventilation syndrome.  She has been treated in the past with BiPAP and is currently on a trilogy ventilator nightly.  [name] tolerating well.  The primary reason for this visit is a mobility evaluation because she is being considered for a motorized wheelchair. No other complaints today, no other changes or issues to address.   ROV 07/12/17 --57 year old woman, former smoker with schizophrenia and history of substance abuse.  She has hypertension with diastolic CHF, COPD with asthmatic features (severe obstruction), obstructive sleep apnea and obesity hypoventilation syndrome on noninvasive ventilation which she uses reliably. She states that she believes that she is having  apneic spell on it.    She underwent a left and right heart catheterization on 06/19/2017 that showed normal coronary arteries and LV function, mild pulmonary hypertension 48/18 (29), PAOP 19.  She has been on stable dose torsemide, metolazone, amlodipine.  [name] currently on 6 L/min at all times. Her exercise tolerance is very poor, she desaturates with almost any ambulation. She went to cardiopulm rehab, but was found to be more appropriate for Boost Program.   [place] 08/07/17 --pleasant 57 year old obese woman with a history of tobacco use, schizophrenia.  She has hypertension with diastolic CHF, COPD with asthmatic component, OSA and mild secondary pulmonary hypertension.  We have identified that she needs 6 L/min at rest, increased to 8 L/min with exertion.  Her oxygen orders have been confirmed today.  At her last visit we tried changing Breo/Spiriva over to Trelegy to see if she would benefit.  She reports that she initially had some sore throat, but now better. She believes that it is helping her breathing more than her other regimen. [name] on ventolin, uses it about 3x a day. [name] concerned that she isn't getting much benefit from it. Uses it when she has secretions difficult to clear. [name] not on mucinex right now. [name] using her Trilogy vent at night reliably, notes significant clinical benefit.  Still on singulair, loratadine.  [name] working on getting involved with Boost program, through cardiology.     Review of Systems  Constitutional: Positive for unexpected weight change. Negative for fever.  HENT: Positive for dental problem and sinus pressure. Negative for congestion, ear pain, nosebleeds, postnasal drip, rhinorrhea, sneezing,  sore throat and trouble swallowing.   Eyes: Negative.  Negative for redness and itching.  Respiratory: Positive for cough, shortness of breath and wheezing. Negative for chest tightness.   Cardiovascular: Positive for leg swelling. Negative for  palpitations.  Gastrointestinal: Negative.  Negative for nausea and vomiting.  Endocrine: Negative.   Genitourinary: Negative.  Negative for dysuria.  Musculoskeletal: Positive for joint swelling.  Skin: Negative.  Negative for rash.  Allergic/Immunologic: Negative.   Neurological: Negative.  Negative for headaches.  Hematological: Bruises/bleeds easily.  Psychiatric/Behavioral: Negative.  Negative for dysphoric mood. The patient is not nervous/anxious.       Objective:   Physical Exam Vitals:   08/07/17 1023  BP: 118/72  Pulse: 70  SpO2: 99%  Weight: 264 lb (119.7 kg)   Gen: Pleasant, overwt, in no distress,  normal affect  ENT: No lesions,  mouth clear,  oropharynx clear, no postnasal drip  Neck: No JVD, no stridor  Lungs: No use of accessory muscles, clear without rales or rhonchi  Cardiovascular: RRR, heart sounds normal, no murmur or gallops, no peripheral edema  Musculoskeletal: No deformities, no cyanosis or clubbing  Neuro: alert, she has some global weakness, about 4+/5 everywhere except the R LE with is 3/5.[name] in a wheelchair  Skin: Warm, no lesions or rashes       Assessment & Plan:  Chronic respiratory failure with hypoxia and hypercapnia ([place]) Order written to arrange for continuous oxygen 6 L/min at rest, 8 L/min with exertion. Continue to use your trilogy ventilator every night as you have been using it. We will investigate options for Cardiopulmonary rehab at [facility]. We will inquire regarding the Boost program that you have referenced.  Follow with Dr Lamonte Sakai in 4 months or sooner if you have any problems.  COPD (chronic obstructive pulmonary disease) (HCC) We will continue Trelegy 1 inhalation once a day.  Remember to rinse and gargle after using this medication.  This medicine will replace your Breo and Spiriva. Continue your Ventolin (albuterol) 2 puffs if needed for shortness of breath, chest tightness, wheezing, mucus clearance. Guaifenesin's for  secretion clearance  Obstructive sleep apnea Good compliance with trilogy vent at home. Some witnessed apneas noted - has a rate on the vent  Baltazar Apo, MD, PhD 08/07/2017, 10:39 AM [facility] [phone] or if no answer [phone]

## 2017-08-08 DIAGNOSIS — I5032 Chronic diastolic (congestive) heart failure: Secondary | ICD-10-CM | POA: Diagnosis not present

## 2017-08-08 DIAGNOSIS — M1990 Unspecified osteoarthritis, unspecified site: Secondary | ICD-10-CM | POA: Diagnosis not present

## 2017-08-08 DIAGNOSIS — J9611 Chronic respiratory failure with hypoxia: Secondary | ICD-10-CM | POA: Diagnosis not present

## 2017-08-09 ENCOUNTER — Other Ambulatory Visit: Payer: Self-pay | Admitting: *Deleted

## 2017-08-09 DIAGNOSIS — I5032 Chronic diastolic (congestive) heart failure: Secondary | ICD-10-CM | POA: Diagnosis not present

## 2017-08-09 DIAGNOSIS — J9611 Chronic respiratory failure with hypoxia: Secondary | ICD-10-CM | POA: Diagnosis not present

## 2017-08-09 DIAGNOSIS — R0602 Shortness of breath: Secondary | ICD-10-CM

## 2017-08-09 DIAGNOSIS — G4733 Obstructive sleep apnea (adult) (pediatric): Secondary | ICD-10-CM | POA: Diagnosis not present

## 2017-08-09 DIAGNOSIS — M1990 Unspecified osteoarthritis, unspecified site: Secondary | ICD-10-CM | POA: Diagnosis not present

## 2017-08-09 DIAGNOSIS — J440 Chronic obstructive pulmonary disease with acute lower respiratory infection: Secondary | ICD-10-CM | POA: Diagnosis not present

## 2017-08-09 DIAGNOSIS — M6281 Muscle weakness (generalized): Secondary | ICD-10-CM | POA: Diagnosis not present

## 2017-08-10 ENCOUNTER — Other Ambulatory Visit: Payer: Self-pay | Admitting: *Deleted

## 2017-08-10 DIAGNOSIS — J9611 Chronic respiratory failure with hypoxia: Secondary | ICD-10-CM | POA: Diagnosis not present

## 2017-08-10 DIAGNOSIS — M1990 Unspecified osteoarthritis, unspecified site: Secondary | ICD-10-CM | POA: Diagnosis not present

## 2017-08-10 DIAGNOSIS — I5032 Chronic diastolic (congestive) heart failure: Secondary | ICD-10-CM | POA: Diagnosis not present

## 2017-08-13 DIAGNOSIS — J449 Chronic obstructive pulmonary disease, unspecified: Secondary | ICD-10-CM | POA: Diagnosis not present

## 2017-08-13 DIAGNOSIS — J9611 Chronic respiratory failure with hypoxia: Secondary | ICD-10-CM | POA: Diagnosis not present

## 2017-08-13 DIAGNOSIS — J189 Pneumonia, unspecified organism: Secondary | ICD-10-CM | POA: Diagnosis not present

## 2017-08-13 DIAGNOSIS — F064 Anxiety disorder due to known physiological condition: Secondary | ICD-10-CM | POA: Diagnosis not present

## 2017-08-13 DIAGNOSIS — J45909 Unspecified asthma, uncomplicated: Secondary | ICD-10-CM | POA: Diagnosis not present

## 2017-08-13 DIAGNOSIS — R062 Wheezing: Secondary | ICD-10-CM | POA: Diagnosis not present

## 2017-08-13 DIAGNOSIS — R0602 Shortness of breath: Secondary | ICD-10-CM | POA: Diagnosis not present

## 2017-08-13 DIAGNOSIS — G4733 Obstructive sleep apnea (adult) (pediatric): Secondary | ICD-10-CM | POA: Diagnosis not present

## 2017-08-13 DIAGNOSIS — I509 Heart failure, unspecified: Secondary | ICD-10-CM | POA: Diagnosis not present

## 2017-08-13 DIAGNOSIS — J440 Chronic obstructive pulmonary disease with acute lower respiratory infection: Secondary | ICD-10-CM | POA: Diagnosis not present

## 2017-08-13 DIAGNOSIS — F331 Major depressive disorder, recurrent, moderate: Secondary | ICD-10-CM | POA: Diagnosis not present

## 2017-08-13 DIAGNOSIS — M6281 Muscle weakness (generalized): Secondary | ICD-10-CM | POA: Diagnosis not present

## 2017-08-13 DIAGNOSIS — M1990 Unspecified osteoarthritis, unspecified site: Secondary | ICD-10-CM | POA: Diagnosis not present

## 2017-08-13 DIAGNOSIS — I5032 Chronic diastolic (congestive) heart failure: Secondary | ICD-10-CM | POA: Diagnosis not present

## 2017-08-14 DIAGNOSIS — M1990 Unspecified osteoarthritis, unspecified site: Secondary | ICD-10-CM | POA: Diagnosis not present

## 2017-08-14 DIAGNOSIS — I5032 Chronic diastolic (congestive) heart failure: Secondary | ICD-10-CM | POA: Diagnosis not present

## 2017-08-14 DIAGNOSIS — J9611 Chronic respiratory failure with hypoxia: Secondary | ICD-10-CM | POA: Diagnosis not present

## 2017-08-15 DIAGNOSIS — M1990 Unspecified osteoarthritis, unspecified site: Secondary | ICD-10-CM | POA: Diagnosis not present

## 2017-08-15 DIAGNOSIS — I5032 Chronic diastolic (congestive) heart failure: Secondary | ICD-10-CM | POA: Diagnosis not present

## 2017-08-15 DIAGNOSIS — J9611 Chronic respiratory failure with hypoxia: Secondary | ICD-10-CM | POA: Diagnosis not present

## 2017-08-16 ENCOUNTER — Ambulatory Visit: Payer: 59 | Admitting: Licensed Clinical Social Worker

## 2017-08-16 DIAGNOSIS — J9611 Chronic respiratory failure with hypoxia: Secondary | ICD-10-CM | POA: Diagnosis not present

## 2017-08-16 DIAGNOSIS — I5032 Chronic diastolic (congestive) heart failure: Secondary | ICD-10-CM | POA: Diagnosis not present

## 2017-08-16 DIAGNOSIS — M1990 Unspecified osteoarthritis, unspecified site: Secondary | ICD-10-CM | POA: Diagnosis not present

## 2017-08-17 DIAGNOSIS — F411 Generalized anxiety disorder: Secondary | ICD-10-CM | POA: Diagnosis not present

## 2017-08-17 DIAGNOSIS — J9611 Chronic respiratory failure with hypoxia: Secondary | ICD-10-CM | POA: Diagnosis not present

## 2017-08-17 DIAGNOSIS — M1990 Unspecified osteoarthritis, unspecified site: Secondary | ICD-10-CM | POA: Diagnosis not present

## 2017-08-17 DIAGNOSIS — I5032 Chronic diastolic (congestive) heart failure: Secondary | ICD-10-CM | POA: Diagnosis not present

## 2017-08-17 DIAGNOSIS — F5081 Binge eating disorder: Secondary | ICD-10-CM | POA: Diagnosis not present

## 2017-08-17 DIAGNOSIS — F330 Major depressive disorder, recurrent, mild: Secondary | ICD-10-CM | POA: Diagnosis not present

## 2017-08-18 DIAGNOSIS — M1990 Unspecified osteoarthritis, unspecified site: Secondary | ICD-10-CM | POA: Diagnosis not present

## 2017-08-18 DIAGNOSIS — I5032 Chronic diastolic (congestive) heart failure: Secondary | ICD-10-CM | POA: Diagnosis not present

## 2017-08-18 DIAGNOSIS — J9611 Chronic respiratory failure with hypoxia: Secondary | ICD-10-CM | POA: Diagnosis not present

## 2017-08-19 DIAGNOSIS — M1990 Unspecified osteoarthritis, unspecified site: Secondary | ICD-10-CM | POA: Diagnosis not present

## 2017-08-19 DIAGNOSIS — J9611 Chronic respiratory failure with hypoxia: Secondary | ICD-10-CM | POA: Diagnosis not present

## 2017-08-19 DIAGNOSIS — I5032 Chronic diastolic (congestive) heart failure: Secondary | ICD-10-CM | POA: Diagnosis not present

## 2017-08-20 DIAGNOSIS — J9611 Chronic respiratory failure with hypoxia: Secondary | ICD-10-CM | POA: Diagnosis not present

## 2017-08-20 DIAGNOSIS — I5032 Chronic diastolic (congestive) heart failure: Secondary | ICD-10-CM | POA: Diagnosis not present

## 2017-08-20 DIAGNOSIS — M1990 Unspecified osteoarthritis, unspecified site: Secondary | ICD-10-CM | POA: Diagnosis not present

## 2017-08-20 MED ORDER — LORATADINE 10 MG PO TABS: 10.0000 mg | ORAL_TABLET | Freq: Every day | ORAL | 3 refills | Status: DC

## 2017-08-21 DIAGNOSIS — J449 Chronic obstructive pulmonary disease, unspecified: Secondary | ICD-10-CM | POA: Diagnosis not present

## 2017-08-21 DIAGNOSIS — M6281 Muscle weakness (generalized): Secondary | ICD-10-CM | POA: Diagnosis not present

## 2017-08-21 DIAGNOSIS — J189 Pneumonia, unspecified organism: Secondary | ICD-10-CM | POA: Diagnosis not present

## 2017-08-21 DIAGNOSIS — M1990 Unspecified osteoarthritis, unspecified site: Secondary | ICD-10-CM | POA: Diagnosis not present

## 2017-08-21 DIAGNOSIS — J45909 Unspecified asthma, uncomplicated: Secondary | ICD-10-CM | POA: Diagnosis not present

## 2017-08-21 DIAGNOSIS — I509 Heart failure, unspecified: Secondary | ICD-10-CM | POA: Diagnosis not present

## 2017-08-21 DIAGNOSIS — G4733 Obstructive sleep apnea (adult) (pediatric): Secondary | ICD-10-CM | POA: Diagnosis not present

## 2017-08-21 DIAGNOSIS — J9611 Chronic respiratory failure with hypoxia: Secondary | ICD-10-CM | POA: Diagnosis not present

## 2017-08-21 DIAGNOSIS — J440 Chronic obstructive pulmonary disease with acute lower respiratory infection: Secondary | ICD-10-CM | POA: Diagnosis not present

## 2017-08-21 DIAGNOSIS — I5032 Chronic diastolic (congestive) heart failure: Secondary | ICD-10-CM | POA: Diagnosis not present

## 2017-08-21 DIAGNOSIS — R062 Wheezing: Secondary | ICD-10-CM | POA: Diagnosis not present

## 2017-08-21 DIAGNOSIS — R0602 Shortness of breath: Secondary | ICD-10-CM | POA: Diagnosis not present

## 2017-08-22 DIAGNOSIS — I5032 Chronic diastolic (congestive) heart failure: Secondary | ICD-10-CM | POA: Diagnosis not present

## 2017-08-22 DIAGNOSIS — J9611 Chronic respiratory failure with hypoxia: Secondary | ICD-10-CM | POA: Diagnosis not present

## 2017-08-22 DIAGNOSIS — M1990 Unspecified osteoarthritis, unspecified site: Secondary | ICD-10-CM | POA: Diagnosis not present

## 2017-08-23 ENCOUNTER — Telehealth: Payer: Self-pay

## 2017-08-23 ENCOUNTER — Ambulatory Visit: Payer: Medicare HMO | Admitting: Family Medicine

## 2017-08-23 ENCOUNTER — Other Ambulatory Visit: Payer: Medicare HMO | Admitting: Internal Medicine

## 2017-08-23 DIAGNOSIS — M2550 Pain in unspecified joint: Secondary | ICD-10-CM

## 2017-08-23 DIAGNOSIS — N183 Chronic kidney disease, stage 3 unspecified: Secondary | ICD-10-CM

## 2017-08-23 DIAGNOSIS — J9611 Chronic respiratory failure with hypoxia: Secondary | ICD-10-CM | POA: Diagnosis not present

## 2017-08-23 DIAGNOSIS — M1990 Unspecified osteoarthritis, unspecified site: Secondary | ICD-10-CM | POA: Diagnosis not present

## 2017-08-23 DIAGNOSIS — I5032 Chronic diastolic (congestive) heart failure: Secondary | ICD-10-CM | POA: Diagnosis not present

## 2017-08-23 DIAGNOSIS — R0602 Shortness of breath: Secondary | ICD-10-CM

## 2017-08-23 NOTE — Telephone Encounter (Signed)
Received VM from patient. Phone call returned to patient who confirmed visit scheduled for today.

## 2017-08-24 DIAGNOSIS — G4733 Obstructive sleep apnea (adult) (pediatric): Secondary | ICD-10-CM | POA: Diagnosis not present

## 2017-08-24 DIAGNOSIS — J440 Chronic obstructive pulmonary disease with acute lower respiratory infection: Secondary | ICD-10-CM | POA: Diagnosis not present

## 2017-08-24 DIAGNOSIS — R0602 Shortness of breath: Secondary | ICD-10-CM | POA: Diagnosis not present

## 2017-08-24 DIAGNOSIS — J45909 Unspecified asthma, uncomplicated: Secondary | ICD-10-CM | POA: Diagnosis not present

## 2017-08-24 DIAGNOSIS — J189 Pneumonia, unspecified organism: Secondary | ICD-10-CM | POA: Diagnosis not present

## 2017-08-24 DIAGNOSIS — R062 Wheezing: Secondary | ICD-10-CM | POA: Diagnosis not present

## 2017-08-25 DIAGNOSIS — I5032 Chronic diastolic (congestive) heart failure: Secondary | ICD-10-CM | POA: Diagnosis not present

## 2017-08-25 DIAGNOSIS — J9611 Chronic respiratory failure with hypoxia: Secondary | ICD-10-CM | POA: Diagnosis not present

## 2017-08-25 DIAGNOSIS — M1990 Unspecified osteoarthritis, unspecified site: Secondary | ICD-10-CM | POA: Diagnosis not present

## 2017-08-27 DIAGNOSIS — J9611 Chronic respiratory failure with hypoxia: Secondary | ICD-10-CM | POA: Diagnosis not present

## 2017-08-27 DIAGNOSIS — M1990 Unspecified osteoarthritis, unspecified site: Secondary | ICD-10-CM | POA: Diagnosis not present

## 2017-08-27 DIAGNOSIS — I5032 Chronic diastolic (congestive) heart failure: Secondary | ICD-10-CM | POA: Diagnosis not present

## 2017-08-28 DIAGNOSIS — J9611 Chronic respiratory failure with hypoxia: Secondary | ICD-10-CM | POA: Diagnosis not present

## 2017-08-28 DIAGNOSIS — M1990 Unspecified osteoarthritis, unspecified site: Secondary | ICD-10-CM | POA: Diagnosis not present

## 2017-08-28 DIAGNOSIS — I5032 Chronic diastolic (congestive) heart failure: Secondary | ICD-10-CM | POA: Diagnosis not present

## 2017-08-29 ENCOUNTER — Other Ambulatory Visit: Payer: Self-pay

## 2017-08-29 ENCOUNTER — Encounter: Payer: Self-pay | Admitting: Family Medicine

## 2017-08-29 ENCOUNTER — Ambulatory Visit (INDEPENDENT_AMBULATORY_CARE_PROVIDER_SITE_OTHER): Payer: Medicare HMO | Admitting: Family Medicine

## 2017-08-29 DIAGNOSIS — F319 Bipolar disorder, unspecified: Secondary | ICD-10-CM | POA: Diagnosis not present

## 2017-08-29 DIAGNOSIS — N183 Chronic kidney disease, stage 3 unspecified: Secondary | ICD-10-CM

## 2017-08-29 DIAGNOSIS — J45909 Unspecified asthma, uncomplicated: Secondary | ICD-10-CM

## 2017-08-29 DIAGNOSIS — M1990 Unspecified osteoarthritis, unspecified site: Secondary | ICD-10-CM | POA: Diagnosis not present

## 2017-08-29 DIAGNOSIS — I5032 Chronic diastolic (congestive) heart failure: Secondary | ICD-10-CM

## 2017-08-29 DIAGNOSIS — J9611 Chronic respiratory failure with hypoxia: Secondary | ICD-10-CM | POA: Diagnosis not present

## 2017-08-29 DIAGNOSIS — E785 Hyperlipidemia, unspecified: Secondary | ICD-10-CM | POA: Diagnosis not present

## 2017-08-29 DIAGNOSIS — D649 Anemia, unspecified: Secondary | ICD-10-CM | POA: Diagnosis not present

## 2017-08-29 DIAGNOSIS — E1142 Type 2 diabetes mellitus with diabetic polyneuropathy: Secondary | ICD-10-CM

## 2017-08-29 MED ORDER — MONTELUKAST SODIUM 10 MG PO TABS: 10.0000 mg | ORAL_TABLET | Freq: Every day | ORAL | 3 refills | Status: DC

## 2017-08-29 MED ORDER — FERROUS SULFATE 325 (65 FE) MG PO TABS: ORAL_TABLET | ORAL | 3 refills | Status: DC

## 2017-08-29 MED ORDER — AMLODIPINE BESYLATE 5 MG PO TABS: ORAL_TABLET | ORAL | 3 refills | Status: DC

## 2017-08-29 MED ORDER — ATORVASTATIN CALCIUM 80 MG PO TABS: 80.0000 mg | ORAL_TABLET | Freq: Every day | ORAL | 3 refills | Status: DC

## 2017-08-29 MED ORDER — GABAPENTIN 600 MG PO TABS: 1200.0000 mg | ORAL_TABLET | Freq: Three times a day (TID) | ORAL | 3 refills | Status: DC

## 2017-08-29 MED ORDER — ASPIRIN 81 MG PO TBEC
DELAYED_RELEASE_TABLET | ORAL | 3 refills | Status: DC
Start: 2017-08-29 — End: 2017-11-12

## 2017-08-29 MED ORDER — OMEPRAZOLE 40 MG PO CPDR: 40.0000 mg | DELAYED_RELEASE_CAPSULE | Freq: Two times a day (BID) | ORAL | 3 refills | Status: DC

## 2017-08-29 MED ORDER — POTASSIUM CHLORIDE ER 10 MEQ PO TBCR: 20.0000 meq | EXTENDED_RELEASE_TABLET | Freq: Two times a day (BID) | ORAL | 6 refills | Status: DC

## 2017-08-29 NOTE — Patient Instructions (Signed)
I refilled your medications as requested.   See me in one month.  I would like to get you off of some of your medicines slowly.

## 2017-08-30 DIAGNOSIS — M1990 Unspecified osteoarthritis, unspecified site: Secondary | ICD-10-CM | POA: Diagnosis not present

## 2017-08-30 DIAGNOSIS — I5032 Chronic diastolic (congestive) heart failure: Secondary | ICD-10-CM | POA: Diagnosis not present

## 2017-08-30 DIAGNOSIS — J9611 Chronic respiratory failure with hypoxia: Secondary | ICD-10-CM | POA: Diagnosis not present

## 2017-08-30 LAB — CBC
Hematocrit: 34 % (ref 34.0–46.6)
Hemoglobin: 11 g/dL — ABNORMAL LOW (ref 11.1–15.9)
MCH: 28.3 pg (ref 26.6–33.0)
MCHC: 32.4 g/dL (ref 31.5–35.7)
MCV: 87 fL (ref 79–97)
Platelets: 329 10*3/uL (ref 150–450)
RBC: 3.89 x10E6/uL (ref 3.77–5.28)
RDW: 14.9 % (ref 12.3–15.4)
WBC: 6.2 10*3/uL (ref 3.4–10.8)

## 2017-08-31 ENCOUNTER — Other Ambulatory Visit: Payer: Self-pay | Admitting: Family Medicine

## 2017-08-31 DIAGNOSIS — I5032 Chronic diastolic (congestive) heart failure: Secondary | ICD-10-CM | POA: Diagnosis not present

## 2017-08-31 DIAGNOSIS — M1990 Unspecified osteoarthritis, unspecified site: Secondary | ICD-10-CM | POA: Diagnosis not present

## 2017-08-31 DIAGNOSIS — J9611 Chronic respiratory failure with hypoxia: Secondary | ICD-10-CM | POA: Diagnosis not present

## 2017-09-01 ENCOUNTER — Encounter: Payer: Self-pay | Admitting: Family Medicine

## 2017-09-01 DIAGNOSIS — J45909 Unspecified asthma, uncomplicated: Secondary | ICD-10-CM | POA: Diagnosis not present

## 2017-09-01 DIAGNOSIS — R0602 Shortness of breath: Secondary | ICD-10-CM | POA: Diagnosis not present

## 2017-09-01 DIAGNOSIS — G4733 Obstructive sleep apnea (adult) (pediatric): Secondary | ICD-10-CM | POA: Diagnosis not present

## 2017-09-01 DIAGNOSIS — M1990 Unspecified osteoarthritis, unspecified site: Secondary | ICD-10-CM | POA: Diagnosis not present

## 2017-09-01 DIAGNOSIS — M6281 Muscle weakness (generalized): Secondary | ICD-10-CM | POA: Diagnosis not present

## 2017-09-01 DIAGNOSIS — J189 Pneumonia, unspecified organism: Secondary | ICD-10-CM | POA: Diagnosis not present

## 2017-09-01 DIAGNOSIS — I5032 Chronic diastolic (congestive) heart failure: Secondary | ICD-10-CM | POA: Diagnosis not present

## 2017-09-01 DIAGNOSIS — J449 Chronic obstructive pulmonary disease, unspecified: Secondary | ICD-10-CM | POA: Diagnosis not present

## 2017-09-01 DIAGNOSIS — I509 Heart failure, unspecified: Secondary | ICD-10-CM | POA: Diagnosis not present

## 2017-09-01 DIAGNOSIS — R062 Wheezing: Secondary | ICD-10-CM | POA: Diagnosis not present

## 2017-09-01 DIAGNOSIS — J440 Chronic obstructive pulmonary disease with acute lower respiratory infection: Secondary | ICD-10-CM | POA: Diagnosis not present

## 2017-09-01 DIAGNOSIS — J9611 Chronic respiratory failure with hypoxia: Secondary | ICD-10-CM | POA: Diagnosis not present

## 2017-09-01 NOTE — Assessment & Plan Note (Signed)
>>  ASSESSMENT AND PLAN FOR STAGE 3B CHRONIC KIDNEY DISEASE ([place]) WRITTEN ON 09/01/2017 10:39 AM BY HENSEL, Jamal Collin, MD  Given my review of her creat trend, she is CKD 3, not 4.

## 2017-09-01 NOTE — Progress Notes (Signed)
   Subjective:    Patient ID: Tiffany [name], female    DOB: 1960-11-12, 57 y.o.   MRN: [id number]  HPI I have known Tiffany [name] for years and am now becoming her PCP.  She has a very long problem and med list.  I will need to work to get that problem list editted to a more usable state and hopefully decrease her meds.  This will take time.  Issues addressed today. 1. Refilled multiple meds. 2. [name] concerned about liver and kidneys.  Never heard from last blood work.  Does have CKD stage 3 and last creat was near her baseline.  Informed.  On my chart review, I do not see evidence of chronic liver disease. 3. Wonders if she needs iv iron and is concerned the oral iron is not working.  Last CBC was 06/19/17.   4. Followed by palliative care (not hospice.) for unclear symptom control. 5. Bipolar disease is severe and makes organized hx difficult.  No SI, HI or mania.  Depression is at a routine level for her. 6. CHF.  Wt down.  SOB at her baseline   Review of Systems     Objective:   Physical Exam Lungs clear Cardiac RRR without m or g Ext 1+ edema. VS and wt noted  Wt is nicely down.         Assessment & Plan:

## 2017-09-01 NOTE — Assessment & Plan Note (Signed)
Seems stable on current meds  

## 2017-09-01 NOTE — Assessment & Plan Note (Signed)
Recheck HGb is up.  Patient informed oral iron is working.

## 2017-09-01 NOTE — Assessment & Plan Note (Signed)
Given my review of her creat trend, she is CKD 3, not 4.

## 2017-09-01 NOTE — Progress Notes (Signed)
PALLIATIVE CARE CONSULT VISIT   PATIENT NAME: Tiffany [name] DOB: 04-23-1960 MRN: [id number]  PRIMARY CARE PROVIDER:   Madison Hickman, MD  REFERRING PROVIDER:  Madison Hickman, MD [place], [place] [address]  RESPONSIBLE PARTY:   Self      RECOMMENDATIONS and PLAN:  1. Shortness of breath R06.02:    At baseline. Decreased episodes of desaturation with use of motorized chairt.  Continue to use supplemental O2(8L).    2. Generalized joint pain  M25.50:  Multiple joints upper and lower extremities.  Improved without use of NSAIDs.  3. Chronic kidney disease stage 3  N18.3:  GFR 40 Crea 2.35 on 7/8.  Improved since d/c of Metformin and NSAIDs.   Continue hydration with recommended fluid limitations  Managed by  Nephrologist.   4.  Goals of care:  Continue to live independently at home and avoid rehospitalization. Improve management of health maintenance. Support given.  Palliative care will continue to follow.  I spent 45 minutes providing this consultation,  from 1:30pm to 2:15pm at home. More than 50% of the time in this consultation was spent coordinating communication with patient   HISTORY OF PRESENT ILLNESS: Follow-up with Tiffany [name].  She reports no exacerations requiring ER or hospitalizations.  [name] mobile within her home with use of motorized chair and utilized an aid for assistance with ADLs.  Oxygen has been increased to 8L/min which maintains O2 sats in the 90s.  She continues to lose weight as planned.  Current weight if 252#.  Weight in May 2019 was 294#.  CODE STATUS: FULL CODE  PPS: 50% HOSPICE ELIGIBILITY/DIAGNOSIS: TBD  PAST MEDICAL HISTORY:  Past Medical History:  Diagnosis Date  . Anxiety   . Arthritis    "all over" (04/10/2016)  . Asthma   . Cardiac arrest ([place]) 09/08/2016   PEA  . Carotid artery stenosis    1-39% bilateral by dopplers 11/2016  . Chronic bronchitis ([place])   . Chronic diastolic (congestive) heart failure ([place])   .  Chronic kidney disease    "I see a kidney dr." (04/10/2016)  . Cocaine abuse ([place])   . Complication of anesthesia    decreased bp, decreased heart rate  . Depression   . Disorder of nervous system   . Emphysema   . GERD (gastroesophageal reflux disease)   . Heart attack ([place]) 1980s  . History of blood transfusion 1994   "couldn't stop bleeding from my period"  . Hyperlipidemia LDL goal <70   . Hypertension   . Incontinence   . Manic depression ([place])   . On home oxygen therapy    "6L; 24/7" (04/10/2016)  . OSA on CPAP    "wear mask sometimes" (04/10/2016)  . Paranoid ([place])    "sometimes; I'm on RX for it" (04/10/2016)  . Pneumonia    "I've had it several times; haven't had it since 06/2015" (04/10/2016)  . Schizophrenia ([place])   . Seasonal allergies   . Seizures ([place])    "don't know what kind; last one was ~ 1 yr ago" (04/10/2016)  . Sinus trouble   . Stroke [facility]) 1980s   denies residual on 04/10/2016  . Type II diabetes mellitus ([place])     SOCIAL HX:  Social History   Tobacco Use  . Smoking status: Former Smoker    Packs/day: 1.50    Years: 38.00    Pack years: 57.00    Types: Cigarettes    Start date:  03/13/1977    Last attempt to quit: 04/10/2016    Years since quitting: 1.2  . Smokeless tobacco: Never Used  Substance Use Topics  . Alcohol use: No    Alcohol/week: 0.0 oz    ALLERGIES:  Allergies  Allergen Reactions  . Hydrocodone-Acetaminophen Shortness Of Breath  . Hydroxyzine Anaphylaxis and Shortness Of Breath  . Latuda [Lurasidone Hcl] Anaphylaxis  . Prednisone Anaphylaxis, Swelling and Other (See Comments)    Tongue swelling  . Tramadol Anaphylaxis  . Codeine Nausea And Vomiting  . Sulfa Antibiotics Itching  . Tape Rash     PERTINENT MEDICATIONS:  Outpatient Encounter Medications as of 07/17/2017  Medication Sig  . acetaminophen (TYLENOL) 500 MG tablet Take 1 tablet (500 mg total) by mouth every 6 (six) hours as needed.  [name] albuterol (PROVENTIL HFA;VENTOLIN  HFA) 108 (90 Base) MCG/ACT inhaler Inhale 2 puffs into the lungs every 4 (four) hours as needed for wheezing or shortness of breath.  [name] albuterol (PROVENTIL) (2.5 MG/3ML) 0.083% nebulizer solution Take 3 mLs (2.5 mg total) by nebulization every 6 (six) hours as needed for wheezing or shortness of breath. (Patient taking differently: Take 2.5 mg by nebulization every 4 (four) hours as needed for wheezing or shortness of breath. )  . amLODipine (NORVASC) 5 MG tablet TAKE 1 TABLET (5 MG TOTAL) BY MOUTH DAILY. (MORNING)  . atorvastatin (LIPITOR) 80 MG tablet Take 1 tablet (80 mg total) by mouth daily.  . chlorhexidine (PERIDEX) 0.12 % solution Use as directed 15 mLs in the mouth or throat See admin instructions. Swish 1/2 oz (15 mls) for 30 seconds and then spit - twice daily  . colchicine 0.6 MG tablet Take 1 tablet (0.6 mg total) by mouth daily.  . diazepam (VALIUM) 5 MG tablet Take 5 mg by mouth 3 (three) times daily.   . Diclofenac Sodium 3 % GEL Apply 1 application topically daily as needed (pain). Use on area of the pain  . escitalopram (LEXAPRO) 20 MG tablet Take 1 tablet (20 mg total) by mouth daily. (Patient taking differently: Take 40 mg by mouth daily. )  . Febuxostat 80 MG TABS Take 1 tablet (80 mg total) by mouth daily.  . ferrous sulfate 325 (65 FE) MG tablet Take 1 tablet (325 mg total) by mouth daily.  . flunisolide (NASALIDE) 25 MCG/ACT (0.025%) SOLN Place 2 sprays into the nose daily.  . fluticasone furoate-vilanterol (BREO ELLIPTA) 100-25 MCG/INH AEPB INHALE 1 PUFF EVERY DAY  . Fluticasone-Umeclidin-Vilant (TRELEGY ELLIPTA) 100-62.5-25 MCG/INH AEPB Inhale 1 puff into the lungs daily.  [name] gabapentin (NEURONTIN) 600 MG tablet Take 1 tablet (600 mg total) by mouth 3 (three) times daily.  [name] gabapentin (NEURONTIN) 600 MG tablet TAKE 1 TAB BY MOUTH 3 TIMES DAILY. ( MORNING,NOON,BEDTIME)  . GOODSENSE ASPIRIN LOW DOSE 81 MG EC tablet TAKE 1 TABLET (81 MG TOTAL) BY MOUTH DAILY (MORNING).  [name]  lisdexamfetamine (VYVANSE) 40 MG capsule Take 60 mg by mouth daily.   [name] loratadine (CLARITIN) 10 MG tablet Take 1 tablet (10 mg total) by mouth daily.  . metolazone (ZAROXOLYN) 2.5 MG tablet Take 1 tablet (2.5 mg total) by mouth every Monday, Wednesday, and Friday. Take 30 mins prior to taking Torsemide  . montelukast (SINGULAIR) 10 MG tablet Take 1 tablet (10 mg total) by mouth at bedtime.  . Multiple Vitamin (MULTIVITAMIN WITH MINERALS) TABS tablet Take 1 tablet by mouth daily.  [name] omeprazole (PRILOSEC) 40 MG capsule Take 1 capsule (40 mg total) by mouth  2 (two) times daily.  [name] oxybutynin (DITROPAN-XL) 10 MG 24 hr tablet Take 1 tablet (10 mg total) by mouth at bedtime.  . OXYGEN Inhale 6 L into the lungs continuous.   . potassium chloride (K-DUR) 10 MEQ tablet Take 4 tablets (40 mEq total) by mouth 3 (three) times daily. May also take 4 tablets (40 mEq total) at bedtime as needed ([place] Metolazone on M/W/F).  [name] sodium chloride (OCEAN) 0.65 % SOLN nasal spray Place 2 sprays into both nostrils 4 (four) times daily.  [name] tiotropium (SPIRIVA HANDIHALER) 18 MCG inhalation capsule Place 1 capsule (18 mcg total) into inhaler and inhale daily.  [name] torsemide (DEMADEX) 20 MG tablet Take 4 tablets (80 mg total) by mouth 2 (two) times daily.  . traZODone (DESYREL) 100 MG tablet 50 mg in the AM and 150 mg at night   No facility-administered encounter medications on file as of 07/17/2017.     PHYSICAL EXAM:   General: Obese female resting in motorized chair.  In NAD ENT:  New full dentures in use Cardiovascular: regular rate and rhythm Pulmonary: clear throughout. No increased work of breathing. O2 via [place] in use at 8L Abdomen:soft, nontender, + bowel sounds Extremities: no edema or ecchymosis Skin: Exposed skin is intact Neurological: Weakness, A&O x3 Psych:  Appropriate mood.  Calm and cooperative  Gonzella Lex, NP-C

## 2017-09-01 NOTE — Assessment & Plan Note (Signed)
[name] making nice progress with wt loss.

## 2017-09-02 DIAGNOSIS — I5032 Chronic diastolic (congestive) heart failure: Secondary | ICD-10-CM | POA: Diagnosis not present

## 2017-09-02 DIAGNOSIS — J9611 Chronic respiratory failure with hypoxia: Secondary | ICD-10-CM | POA: Diagnosis not present

## 2017-09-02 DIAGNOSIS — M1990 Unspecified osteoarthritis, unspecified site: Secondary | ICD-10-CM | POA: Diagnosis not present

## 2017-09-03 DIAGNOSIS — I5032 Chronic diastolic (congestive) heart failure: Secondary | ICD-10-CM | POA: Diagnosis not present

## 2017-09-03 DIAGNOSIS — M1990 Unspecified osteoarthritis, unspecified site: Secondary | ICD-10-CM | POA: Diagnosis not present

## 2017-09-03 DIAGNOSIS — J9611 Chronic respiratory failure with hypoxia: Secondary | ICD-10-CM | POA: Diagnosis not present

## 2017-09-04 DIAGNOSIS — I5032 Chronic diastolic (congestive) heart failure: Secondary | ICD-10-CM | POA: Diagnosis not present

## 2017-09-04 DIAGNOSIS — J9611 Chronic respiratory failure with hypoxia: Secondary | ICD-10-CM | POA: Diagnosis not present

## 2017-09-04 DIAGNOSIS — M1990 Unspecified osteoarthritis, unspecified site: Secondary | ICD-10-CM | POA: Diagnosis not present

## 2017-09-05 DIAGNOSIS — J9611 Chronic respiratory failure with hypoxia: Secondary | ICD-10-CM | POA: Diagnosis not present

## 2017-09-05 DIAGNOSIS — I5032 Chronic diastolic (congestive) heart failure: Secondary | ICD-10-CM | POA: Diagnosis not present

## 2017-09-05 DIAGNOSIS — M1990 Unspecified osteoarthritis, unspecified site: Secondary | ICD-10-CM | POA: Diagnosis not present

## 2017-09-06 DIAGNOSIS — I5032 Chronic diastolic (congestive) heart failure: Secondary | ICD-10-CM | POA: Diagnosis not present

## 2017-09-06 DIAGNOSIS — M1990 Unspecified osteoarthritis, unspecified site: Secondary | ICD-10-CM | POA: Diagnosis not present

## 2017-09-06 DIAGNOSIS — J9611 Chronic respiratory failure with hypoxia: Secondary | ICD-10-CM | POA: Diagnosis not present

## 2017-09-07 ENCOUNTER — Telehealth: Payer: Self-pay

## 2017-09-07 DIAGNOSIS — J9611 Chronic respiratory failure with hypoxia: Secondary | ICD-10-CM | POA: Diagnosis not present

## 2017-09-07 DIAGNOSIS — F5081 Binge eating disorder: Secondary | ICD-10-CM | POA: Diagnosis not present

## 2017-09-07 DIAGNOSIS — F411 Generalized anxiety disorder: Secondary | ICD-10-CM | POA: Diagnosis not present

## 2017-09-07 DIAGNOSIS — I5032 Chronic diastolic (congestive) heart failure: Secondary | ICD-10-CM | POA: Diagnosis not present

## 2017-09-07 DIAGNOSIS — M1990 Unspecified osteoarthritis, unspecified site: Secondary | ICD-10-CM | POA: Diagnosis not present

## 2017-09-07 DIAGNOSIS — F330 Major depressive disorder, recurrent, mild: Secondary | ICD-10-CM | POA: Diagnosis not present

## 2017-09-07 NOTE — Telephone Encounter (Signed)
Phone call placed to patient to offer to schedule follow up with Palliative Care NP and to give her the name of a pharmacy that will assist with filling pill box. Visit scheduled for 09/13/17

## 2017-09-09 DIAGNOSIS — G4733 Obstructive sleep apnea (adult) (pediatric): Secondary | ICD-10-CM | POA: Diagnosis not present

## 2017-09-09 DIAGNOSIS — M6281 Muscle weakness (generalized): Secondary | ICD-10-CM | POA: Diagnosis not present

## 2017-09-09 DIAGNOSIS — J440 Chronic obstructive pulmonary disease with acute lower respiratory infection: Secondary | ICD-10-CM | POA: Diagnosis not present

## 2017-09-10 DIAGNOSIS — I5032 Chronic diastolic (congestive) heart failure: Secondary | ICD-10-CM | POA: Diagnosis not present

## 2017-09-10 DIAGNOSIS — M1990 Unspecified osteoarthritis, unspecified site: Secondary | ICD-10-CM | POA: Diagnosis not present

## 2017-09-10 DIAGNOSIS — J9611 Chronic respiratory failure with hypoxia: Secondary | ICD-10-CM | POA: Diagnosis not present

## 2017-09-11 ENCOUNTER — Ambulatory Visit: Payer: 59 | Admitting: Licensed Clinical Social Worker

## 2017-09-11 DIAGNOSIS — I5032 Chronic diastolic (congestive) heart failure: Secondary | ICD-10-CM | POA: Diagnosis not present

## 2017-09-11 DIAGNOSIS — J9611 Chronic respiratory failure with hypoxia: Secondary | ICD-10-CM | POA: Diagnosis not present

## 2017-09-11 DIAGNOSIS — M1990 Unspecified osteoarthritis, unspecified site: Secondary | ICD-10-CM | POA: Diagnosis not present

## 2017-09-12 DIAGNOSIS — F331 Major depressive disorder, recurrent, moderate: Secondary | ICD-10-CM | POA: Diagnosis not present

## 2017-09-12 DIAGNOSIS — I5032 Chronic diastolic (congestive) heart failure: Secondary | ICD-10-CM | POA: Diagnosis not present

## 2017-09-12 DIAGNOSIS — J9611 Chronic respiratory failure with hypoxia: Secondary | ICD-10-CM | POA: Diagnosis not present

## 2017-09-12 DIAGNOSIS — F064 Anxiety disorder due to known physiological condition: Secondary | ICD-10-CM | POA: Diagnosis not present

## 2017-09-12 DIAGNOSIS — M1990 Unspecified osteoarthritis, unspecified site: Secondary | ICD-10-CM | POA: Diagnosis not present

## 2017-09-13 ENCOUNTER — Ambulatory Visit (INDEPENDENT_AMBULATORY_CARE_PROVIDER_SITE_OTHER): Payer: Medicare HMO | Admitting: Licensed Clinical Social Worker

## 2017-09-13 ENCOUNTER — Other Ambulatory Visit: Payer: Medicare HMO | Admitting: Internal Medicine

## 2017-09-13 DIAGNOSIS — G4733 Obstructive sleep apnea (adult) (pediatric): Secondary | ICD-10-CM | POA: Diagnosis not present

## 2017-09-13 DIAGNOSIS — N183 Chronic kidney disease, stage 3 (moderate): Secondary | ICD-10-CM | POA: Diagnosis not present

## 2017-09-13 DIAGNOSIS — J9611 Chronic respiratory failure with hypoxia: Secondary | ICD-10-CM | POA: Diagnosis not present

## 2017-09-13 DIAGNOSIS — I5032 Chronic diastolic (congestive) heart failure: Secondary | ICD-10-CM | POA: Diagnosis not present

## 2017-09-13 DIAGNOSIS — I509 Heart failure, unspecified: Secondary | ICD-10-CM | POA: Diagnosis not present

## 2017-09-13 DIAGNOSIS — R0602 Shortness of breath: Secondary | ICD-10-CM | POA: Diagnosis not present

## 2017-09-13 DIAGNOSIS — F3341 Major depressive disorder, recurrent, in partial remission: Secondary | ICD-10-CM | POA: Diagnosis not present

## 2017-09-13 DIAGNOSIS — M1990 Unspecified osteoarthritis, unspecified site: Secondary | ICD-10-CM | POA: Diagnosis not present

## 2017-09-13 DIAGNOSIS — M6281 Muscle weakness (generalized): Secondary | ICD-10-CM | POA: Diagnosis not present

## 2017-09-13 DIAGNOSIS — J440 Chronic obstructive pulmonary disease with acute lower respiratory infection: Secondary | ICD-10-CM | POA: Diagnosis not present

## 2017-09-13 DIAGNOSIS — E1122 Type 2 diabetes mellitus with diabetic chronic kidney disease: Secondary | ICD-10-CM | POA: Diagnosis not present

## 2017-09-13 DIAGNOSIS — J189 Pneumonia, unspecified organism: Secondary | ICD-10-CM | POA: Diagnosis not present

## 2017-09-13 DIAGNOSIS — I129 Hypertensive chronic kidney disease with stage 1 through stage 4 chronic kidney disease, or unspecified chronic kidney disease: Secondary | ICD-10-CM | POA: Diagnosis not present

## 2017-09-13 DIAGNOSIS — M109 Gout, unspecified: Secondary | ICD-10-CM | POA: Diagnosis not present

## 2017-09-13 DIAGNOSIS — J449 Chronic obstructive pulmonary disease, unspecified: Secondary | ICD-10-CM | POA: Diagnosis not present

## 2017-09-13 DIAGNOSIS — R062 Wheezing: Secondary | ICD-10-CM | POA: Diagnosis not present

## 2017-09-13 DIAGNOSIS — J45909 Unspecified asthma, uncomplicated: Secondary | ICD-10-CM | POA: Diagnosis not present

## 2017-09-14 DIAGNOSIS — J9611 Chronic respiratory failure with hypoxia: Secondary | ICD-10-CM | POA: Diagnosis not present

## 2017-09-14 DIAGNOSIS — M1990 Unspecified osteoarthritis, unspecified site: Secondary | ICD-10-CM | POA: Diagnosis not present

## 2017-09-14 DIAGNOSIS — I5032 Chronic diastolic (congestive) heart failure: Secondary | ICD-10-CM | POA: Diagnosis not present

## 2017-09-15 DIAGNOSIS — M1990 Unspecified osteoarthritis, unspecified site: Secondary | ICD-10-CM | POA: Diagnosis not present

## 2017-09-15 DIAGNOSIS — J9611 Chronic respiratory failure with hypoxia: Secondary | ICD-10-CM | POA: Diagnosis not present

## 2017-09-15 DIAGNOSIS — I5032 Chronic diastolic (congestive) heart failure: Secondary | ICD-10-CM | POA: Diagnosis not present

## 2017-09-16 DIAGNOSIS — J9611 Chronic respiratory failure with hypoxia: Secondary | ICD-10-CM | POA: Diagnosis not present

## 2017-09-16 DIAGNOSIS — M1990 Unspecified osteoarthritis, unspecified site: Secondary | ICD-10-CM | POA: Diagnosis not present

## 2017-09-16 DIAGNOSIS — I5032 Chronic diastolic (congestive) heart failure: Secondary | ICD-10-CM | POA: Diagnosis not present

## 2017-09-17 DIAGNOSIS — I5032 Chronic diastolic (congestive) heart failure: Secondary | ICD-10-CM | POA: Diagnosis not present

## 2017-09-17 DIAGNOSIS — J9611 Chronic respiratory failure with hypoxia: Secondary | ICD-10-CM | POA: Diagnosis not present

## 2017-09-17 DIAGNOSIS — M1990 Unspecified osteoarthritis, unspecified site: Secondary | ICD-10-CM | POA: Diagnosis not present

## 2017-09-18 DIAGNOSIS — I5032 Chronic diastolic (congestive) heart failure: Secondary | ICD-10-CM | POA: Diagnosis not present

## 2017-09-18 DIAGNOSIS — M1990 Unspecified osteoarthritis, unspecified site: Secondary | ICD-10-CM | POA: Diagnosis not present

## 2017-09-18 DIAGNOSIS — J9611 Chronic respiratory failure with hypoxia: Secondary | ICD-10-CM | POA: Diagnosis not present

## 2017-09-19 DIAGNOSIS — F064 Anxiety disorder due to known physiological condition: Secondary | ICD-10-CM | POA: Diagnosis not present

## 2017-09-19 DIAGNOSIS — F330 Major depressive disorder, recurrent, mild: Secondary | ICD-10-CM | POA: Diagnosis not present

## 2017-09-19 DIAGNOSIS — M1990 Unspecified osteoarthritis, unspecified site: Secondary | ICD-10-CM | POA: Diagnosis not present

## 2017-09-19 DIAGNOSIS — I5032 Chronic diastolic (congestive) heart failure: Secondary | ICD-10-CM | POA: Diagnosis not present

## 2017-09-19 DIAGNOSIS — F5081 Binge eating disorder: Secondary | ICD-10-CM | POA: Diagnosis not present

## 2017-09-19 DIAGNOSIS — J9611 Chronic respiratory failure with hypoxia: Secondary | ICD-10-CM | POA: Diagnosis not present

## 2017-09-19 DIAGNOSIS — F411 Generalized anxiety disorder: Secondary | ICD-10-CM | POA: Diagnosis not present

## 2017-09-20 DIAGNOSIS — J9611 Chronic respiratory failure with hypoxia: Secondary | ICD-10-CM | POA: Diagnosis not present

## 2017-09-20 DIAGNOSIS — I5032 Chronic diastolic (congestive) heart failure: Secondary | ICD-10-CM | POA: Diagnosis not present

## 2017-09-20 DIAGNOSIS — M1990 Unspecified osteoarthritis, unspecified site: Secondary | ICD-10-CM | POA: Diagnosis not present

## 2017-09-21 DIAGNOSIS — M1990 Unspecified osteoarthritis, unspecified site: Secondary | ICD-10-CM | POA: Diagnosis not present

## 2017-09-21 DIAGNOSIS — I509 Heart failure, unspecified: Secondary | ICD-10-CM | POA: Diagnosis not present

## 2017-09-21 DIAGNOSIS — J440 Chronic obstructive pulmonary disease with acute lower respiratory infection: Secondary | ICD-10-CM | POA: Diagnosis not present

## 2017-09-21 DIAGNOSIS — J45909 Unspecified asthma, uncomplicated: Secondary | ICD-10-CM | POA: Diagnosis not present

## 2017-09-21 DIAGNOSIS — R062 Wheezing: Secondary | ICD-10-CM | POA: Diagnosis not present

## 2017-09-21 DIAGNOSIS — J449 Chronic obstructive pulmonary disease, unspecified: Secondary | ICD-10-CM | POA: Diagnosis not present

## 2017-09-21 DIAGNOSIS — J189 Pneumonia, unspecified organism: Secondary | ICD-10-CM | POA: Diagnosis not present

## 2017-09-21 DIAGNOSIS — I5032 Chronic diastolic (congestive) heart failure: Secondary | ICD-10-CM | POA: Diagnosis not present

## 2017-09-21 DIAGNOSIS — R0602 Shortness of breath: Secondary | ICD-10-CM | POA: Diagnosis not present

## 2017-09-21 DIAGNOSIS — J9611 Chronic respiratory failure with hypoxia: Secondary | ICD-10-CM | POA: Diagnosis not present

## 2017-09-21 DIAGNOSIS — G4733 Obstructive sleep apnea (adult) (pediatric): Secondary | ICD-10-CM | POA: Diagnosis not present

## 2017-09-21 DIAGNOSIS — M6281 Muscle weakness (generalized): Secondary | ICD-10-CM | POA: Diagnosis not present

## 2017-09-24 DIAGNOSIS — J45909 Unspecified asthma, uncomplicated: Secondary | ICD-10-CM | POA: Diagnosis not present

## 2017-09-24 DIAGNOSIS — J189 Pneumonia, unspecified organism: Secondary | ICD-10-CM | POA: Diagnosis not present

## 2017-09-24 DIAGNOSIS — G4733 Obstructive sleep apnea (adult) (pediatric): Secondary | ICD-10-CM | POA: Diagnosis not present

## 2017-09-24 DIAGNOSIS — R0602 Shortness of breath: Secondary | ICD-10-CM | POA: Diagnosis not present

## 2017-09-24 DIAGNOSIS — R062 Wheezing: Secondary | ICD-10-CM | POA: Diagnosis not present

## 2017-09-24 DIAGNOSIS — J440 Chronic obstructive pulmonary disease with acute lower respiratory infection: Secondary | ICD-10-CM | POA: Diagnosis not present

## 2017-09-25 DIAGNOSIS — M2550 Pain in unspecified joint: Secondary | ICD-10-CM | POA: Diagnosis not present

## 2017-09-25 DIAGNOSIS — M150 Primary generalized (osteo)arthritis: Secondary | ICD-10-CM | POA: Diagnosis not present

## 2017-09-25 DIAGNOSIS — Z6841 Body Mass Index (BMI) 40.0 and over, adult: Secondary | ICD-10-CM | POA: Diagnosis not present

## 2017-09-25 DIAGNOSIS — D508 Other iron deficiency anemias: Secondary | ICD-10-CM | POA: Diagnosis not present

## 2017-09-25 DIAGNOSIS — M109 Gout, unspecified: Secondary | ICD-10-CM | POA: Diagnosis not present

## 2017-09-27 ENCOUNTER — Ambulatory Visit: Payer: Medicare HMO | Admitting: Licensed Clinical Social Worker

## 2017-09-27 ENCOUNTER — Other Ambulatory Visit: Payer: Medicare HMO | Admitting: Internal Medicine

## 2017-09-27 ENCOUNTER — Other Ambulatory Visit: Payer: Self-pay | Admitting: Family Medicine

## 2017-09-27 DIAGNOSIS — R296 Repeated falls: Secondary | ICD-10-CM

## 2017-09-27 DIAGNOSIS — M25551 Pain in right hip: Secondary | ICD-10-CM

## 2017-09-27 DIAGNOSIS — R0602 Shortness of breath: Secondary | ICD-10-CM | POA: Diagnosis not present

## 2017-09-27 DIAGNOSIS — M109 Gout, unspecified: Secondary | ICD-10-CM

## 2017-09-27 NOTE — Progress Notes (Signed)
PALLIATIVE CARE CONSULT VISIT   PATIENT NAME: Tiffany [name] DOB: 1960-02-07 MRN: [id number]  PRIMARY CARE PROVIDER:   Zenia Resides, MD  REFERRING PROVIDER:  Zenia Resides, MD [address] [place], [place] [address]  RESPONSIBLE PARTY:  Self       PATIENT NAME: Tiffany [name] DOB: 03-Jan-1961 MRN: [id number]  REFERRING PROVIDER: Zenia Resides, MD [place], [place] [address]   RESPONSIBLE PARTY:  [place]ct [place] Phone Work Phone Relationship Acct Type  [id number] KALEYA, [phone]  Self P/F     [address] [place], [place] [address]    BILLABLE ICD-10: Frequent falls  R29.6          Hip pain, right  M25.551          Shortness of breath  R06.02   RECOMMENDATIONS and PLAN:  1.  Frequent falls:  Related to generalized weakness and deconditioning.  Consider outpatient PT for gait training and improved balance.       2.  Hip pain, right: Related to recurrent falls in home.   Pending xray R hip.  Ortho evaluation prn.  Continue in home aid for assistance with ADLs.  3.  Shortness of breath:  Chronic state and stable with use of supplemental O2.   Maintaining sats in mid 90s.  Continue same treatment.   4.  Palliative care encounter:  Continue to avoid re-hospitalizations.  I spent 30 minutes providing this consultation,  from 1:00pm to 1:30pm at home.. More than 50% of the time in this consultation was spent coordinating communication with patient.  Ralene Bathe, NP was also present during home visit.   HISTORY OF PRESENT ILLNESS:  Follow-up with Adriana Mccallum.  Pt. Reports 2 recent falls within her home resulting in pain of R hip and instability with standing.  No reports of loss of conciousness  CODE STATUS: FULL CODE ADVANCED DIRECTIVES: Y, FULL SCOPE OF TREATMENT  PPS: 50%  HOSPICE ELIGIBILITY/DIAGNOSIS:TBD         PHYSICAL EXAM:   GEN: Morbidly obese female, sitting in motorized chair.  In  NAD  LUNGS: Even and unlabored respiration.  Clear throughout.  O2 via [place] in use CARDIAC: S1S2, RRR EXTREMITIES: Trace edema RLE distally SKIN: Deep ecchymosis of the R knee.  Exposed skin is intact  NEURO: A&O x3,  Generalized weakness PSYCH:  Appropriate mood, cooperative  Gonzella Lex, NP-C

## 2017-09-28 DIAGNOSIS — F064 Anxiety disorder due to known physiological condition: Secondary | ICD-10-CM | POA: Diagnosis not present

## 2017-09-28 DIAGNOSIS — F331 Major depressive disorder, recurrent, moderate: Secondary | ICD-10-CM | POA: Diagnosis not present

## 2017-09-29 DIAGNOSIS — I5032 Chronic diastolic (congestive) heart failure: Secondary | ICD-10-CM | POA: Diagnosis not present

## 2017-09-29 DIAGNOSIS — J9611 Chronic respiratory failure with hypoxia: Secondary | ICD-10-CM | POA: Diagnosis not present

## 2017-09-29 DIAGNOSIS — M1990 Unspecified osteoarthritis, unspecified site: Secondary | ICD-10-CM | POA: Diagnosis not present

## 2017-09-30 DIAGNOSIS — M1990 Unspecified osteoarthritis, unspecified site: Secondary | ICD-10-CM | POA: Diagnosis not present

## 2017-09-30 DIAGNOSIS — I5032 Chronic diastolic (congestive) heart failure: Secondary | ICD-10-CM | POA: Diagnosis not present

## 2017-09-30 DIAGNOSIS — J9611 Chronic respiratory failure with hypoxia: Secondary | ICD-10-CM | POA: Diagnosis not present

## 2017-10-01 DIAGNOSIS — I5032 Chronic diastolic (congestive) heart failure: Secondary | ICD-10-CM | POA: Diagnosis not present

## 2017-10-01 DIAGNOSIS — M1990 Unspecified osteoarthritis, unspecified site: Secondary | ICD-10-CM | POA: Diagnosis not present

## 2017-10-01 DIAGNOSIS — J9611 Chronic respiratory failure with hypoxia: Secondary | ICD-10-CM | POA: Diagnosis not present

## 2017-10-02 DIAGNOSIS — J9611 Chronic respiratory failure with hypoxia: Secondary | ICD-10-CM | POA: Diagnosis not present

## 2017-10-02 DIAGNOSIS — M1990 Unspecified osteoarthritis, unspecified site: Secondary | ICD-10-CM | POA: Diagnosis not present

## 2017-10-02 DIAGNOSIS — I5032 Chronic diastolic (congestive) heart failure: Secondary | ICD-10-CM | POA: Diagnosis not present

## 2017-10-03 ENCOUNTER — Other Ambulatory Visit: Payer: Self-pay | Admitting: Family Medicine

## 2017-10-03 DIAGNOSIS — J9611 Chronic respiratory failure with hypoxia: Secondary | ICD-10-CM | POA: Diagnosis not present

## 2017-10-03 DIAGNOSIS — M1990 Unspecified osteoarthritis, unspecified site: Secondary | ICD-10-CM | POA: Diagnosis not present

## 2017-10-03 DIAGNOSIS — I5032 Chronic diastolic (congestive) heart failure: Secondary | ICD-10-CM | POA: Diagnosis not present

## 2017-10-04 DIAGNOSIS — M1990 Unspecified osteoarthritis, unspecified site: Secondary | ICD-10-CM | POA: Diagnosis not present

## 2017-10-04 DIAGNOSIS — J9611 Chronic respiratory failure with hypoxia: Secondary | ICD-10-CM | POA: Diagnosis not present

## 2017-10-04 DIAGNOSIS — I5032 Chronic diastolic (congestive) heart failure: Secondary | ICD-10-CM | POA: Diagnosis not present

## 2017-10-05 DIAGNOSIS — I5032 Chronic diastolic (congestive) heart failure: Secondary | ICD-10-CM | POA: Diagnosis not present

## 2017-10-05 DIAGNOSIS — M1990 Unspecified osteoarthritis, unspecified site: Secondary | ICD-10-CM | POA: Diagnosis not present

## 2017-10-05 DIAGNOSIS — J9611 Chronic respiratory failure with hypoxia: Secondary | ICD-10-CM | POA: Diagnosis not present

## 2017-10-06 DIAGNOSIS — I5032 Chronic diastolic (congestive) heart failure: Secondary | ICD-10-CM | POA: Diagnosis not present

## 2017-10-06 DIAGNOSIS — M1990 Unspecified osteoarthritis, unspecified site: Secondary | ICD-10-CM | POA: Diagnosis not present

## 2017-10-06 DIAGNOSIS — J9611 Chronic respiratory failure with hypoxia: Secondary | ICD-10-CM | POA: Diagnosis not present

## 2017-10-07 DIAGNOSIS — J9611 Chronic respiratory failure with hypoxia: Secondary | ICD-10-CM | POA: Diagnosis not present

## 2017-10-07 DIAGNOSIS — I5032 Chronic diastolic (congestive) heart failure: Secondary | ICD-10-CM | POA: Diagnosis not present

## 2017-10-07 DIAGNOSIS — M1990 Unspecified osteoarthritis, unspecified site: Secondary | ICD-10-CM | POA: Diagnosis not present

## 2017-10-09 ENCOUNTER — Ambulatory Visit (INDEPENDENT_AMBULATORY_CARE_PROVIDER_SITE_OTHER): Payer: Medicare HMO | Admitting: Emergency Medicine

## 2017-10-09 ENCOUNTER — Encounter: Payer: Self-pay | Admitting: Emergency Medicine

## 2017-10-09 DIAGNOSIS — J9612 Chronic respiratory failure with hypercapnia: Secondary | ICD-10-CM

## 2017-10-09 DIAGNOSIS — J9611 Chronic respiratory failure with hypoxia: Secondary | ICD-10-CM | POA: Diagnosis not present

## 2017-10-09 DIAGNOSIS — M1990 Unspecified osteoarthritis, unspecified site: Secondary | ICD-10-CM | POA: Diagnosis not present

## 2017-10-09 DIAGNOSIS — J42 Unspecified chronic bronchitis: Secondary | ICD-10-CM

## 2017-10-09 DIAGNOSIS — I5032 Chronic diastolic (congestive) heart failure: Secondary | ICD-10-CM | POA: Diagnosis not present

## 2017-10-09 DIAGNOSIS — G4733 Obstructive sleep apnea (adult) (pediatric): Secondary | ICD-10-CM | POA: Diagnosis not present

## 2017-10-09 NOTE — Assessment & Plan Note (Signed)
Please continue Trelegy 1 inhalation daily.  Remember to rinse and gargle after using this medication. Please continue to use your albuterol either 2 puffs or 1 nebulizer treatment up to every 4 hours if needed for shortness of breath, wheezing, chest tightness.  We will refill this for you today. We will make a referral for home physical therapy through [facility]. Follow with Dr Lamonte Sakai in 4 months or sooner if you have any problems.

## 2017-10-09 NOTE — Progress Notes (Signed)
Subjective:    Patient ID: Tiffany [name], female    DOB: 18-Jun-1960, 57 y.o.   MRN: [id number]  HPI  ROV 10/09/17 --follow-up visit for 57 year old woman with a history of obesity, schizophrenia, tobacco use with associated COPD (asthmatic component).  She has OSA/OHS with mild secondary pulmonary hypertension (48/18, mean 29 on 06/19/2017).  She uses 6 to 8 L oxygen per minute, uses a trilogy ventilator reliably nightly.  Current bronchodilator regimen is Trelegy and albuterol which she uses approximately.  [name] on Singulair and loratadine, omeprazole 40 mg twice daily. Her functional capacity is limited, uses an Dealer wheelchair much of the time. She has fallen a few times since last time. [name] using her Trilogy at night, but notes that she is getting less sleep overall. She never heard anything about the Boost program. [name] affiliated with palliative care, has been visited by Jannett Celestine, NP. She remains on 6-8L/min. Remains remains on Trelegy, feels that she benefits. Offered the flu shot today - she deferred.    Review of Systems  Constitutional: Positive for unexpected weight change. Negative for fever.  HENT: Positive for dental problem and sinus pressure. Negative for congestion, ear pain, nosebleeds, postnasal drip, rhinorrhea, sneezing, sore throat and trouble swallowing.   Eyes: Negative.  Negative for redness and itching.  Respiratory: Positive for cough, shortness of breath and wheezing. Negative for chest tightness.   Cardiovascular: Positive for leg swelling. Negative for palpitations.  Gastrointestinal: Negative.  Negative for nausea and vomiting.  Endocrine: Negative.   Genitourinary: Negative.  Negative for dysuria.  Musculoskeletal: Positive for joint swelling.  Skin: Negative.  Negative for rash.  Allergic/Immunologic: Negative.   Neurological: Negative.  Negative for headaches.  Hematological: Bruises/bleeds easily.  Psychiatric/Behavioral: Negative.  Negative for  dysphoric mood. The patient is not nervous/anxious.       Objective:   Physical Exam Vitals:   10/09/17 1124  BP: 136/78  Pulse: 87  SpO2: 98%   Gen: Pleasant, overwt, in no distress,  normal affect, in wheelchair  ENT: No lesions,  mouth clear,  oropharynx clear, no postnasal drip  Neck: No JVD, no stridor  Lungs: No use of accessory muscles, clear without rales or rhonchi  Cardiovascular: RRR, heart sounds normal, no murmur or gallops, no peripheral edema  Musculoskeletal: No deformities, no cyanosis or clubbing  Neuro: alert, she has some global weakness, about 4+/5 everywhere except the R LE with is 3/5.[name] in a wheelchair  Skin: Warm, no lesions or rashes       Assessment & Plan:  COPD (chronic obstructive pulmonary disease) ([place]) Please continue Trelegy 1 inhalation daily.  Remember to rinse and gargle after using this medication. Please continue to use your albuterol either 2 puffs or 1 nebulizer treatment up to every 4 hours if needed for shortness of breath, wheezing, chest tightness.  We will refill this for you today. We will make a referral for home physical therapy through [facility]. Follow with Dr Lamonte Sakai in 4 months or sooner if you have any problems.  Obstructive sleep apnea With associated chronic respiratory failure.  She uses a trilogy ventilator with good compliance.  We will continue same  Chronic respiratory failure with hypoxia and hypercapnia ([place]) Trilogy ventilator as above.  Continue oxygen at 6 to 8 L/min at all times.  Note that she is affiliated with palliative care and I completely support this plan.  I do not believe she would benefit from reintubation should  she experience acute on chronic respiratory failure.  Baltazar Apo, MD, PhD 10/09/2017, 11:39 AM [facility] [phone] or if no answer [phone]

## 2017-10-09 NOTE — Assessment & Plan Note (Signed)
Trilogy ventilator as above.  Continue oxygen at 6 to 8 L/min at all times.  Note that she is affiliated with palliative care and I completely support this plan.  I do not believe she would benefit from reintubation should she experience acute on chronic respiratory failure.

## 2017-10-09 NOTE — Assessment & Plan Note (Signed)
With associated chronic respiratory failure.  She uses a trilogy ventilator with good compliance.  We will continue same

## 2017-10-09 NOTE — Patient Instructions (Addendum)
Please continue Trelegy 1 inhalation daily.  Remember to rinse and gargle after using this medication. Please continue to use your albuterol either 2 puffs or 1 nebulizer treatment up to every 4 hours if needed for shortness of breath, wheezing, chest tightness.  We will refill this for you today. Continue oxygen at 6 to 8 L/min as you have been using it. Use your trilogy ventilator every night while sleeping. Please continue Singulair, loratadine, omeprazole as you have been taking them. You would probably benefit from getting the flu shot.  Please let us know if you change your mind about this. Your pneumonia shot is up-to-date. We will make a referral for home physical therapy through [facility]. Follow with Dr Lamonte Sakai in 4 months or sooner if you have any problems.

## 2017-10-09 NOTE — Addendum Note (Signed)
Addended by: Desmond Dike C on: 10/09/2017 11:43 AM   Modules accepted: Orders

## 2017-10-10 ENCOUNTER — Telehealth: Payer: Self-pay | Admitting: Emergency Medicine

## 2017-10-10 DIAGNOSIS — J9611 Chronic respiratory failure with hypoxia: Secondary | ICD-10-CM | POA: Diagnosis not present

## 2017-10-10 DIAGNOSIS — M1990 Unspecified osteoarthritis, unspecified site: Secondary | ICD-10-CM | POA: Diagnosis not present

## 2017-10-10 DIAGNOSIS — J42 Unspecified chronic bronchitis: Secondary | ICD-10-CM

## 2017-10-10 DIAGNOSIS — I5032 Chronic diastolic (congestive) heart failure: Secondary | ICD-10-CM

## 2017-10-10 NOTE — Telephone Encounter (Signed)
Spoke with Carlisle from [facility] about the order for Home health. The order has been changed and nothing further is needed at this time.

## 2017-10-11 ENCOUNTER — Other Ambulatory Visit: Payer: Self-pay

## 2017-10-11 ENCOUNTER — Ambulatory Visit: Payer: Self-pay | Admitting: Family Medicine

## 2017-10-11 ENCOUNTER — Encounter: Payer: Self-pay | Admitting: Family Medicine

## 2017-10-11 ENCOUNTER — Ambulatory Visit (INDEPENDENT_AMBULATORY_CARE_PROVIDER_SITE_OTHER): Payer: Medicare HMO | Admitting: Family Medicine

## 2017-10-11 VITALS — BP 124/76 | HR 92 | Temp 98.3°F | Ht 62.0 in | Wt 235.2 lb

## 2017-10-11 DIAGNOSIS — R296 Repeated falls: Secondary | ICD-10-CM | POA: Diagnosis not present

## 2017-10-11 DIAGNOSIS — I5032 Chronic diastolic (congestive) heart failure: Secondary | ICD-10-CM | POA: Diagnosis not present

## 2017-10-11 DIAGNOSIS — J9611 Chronic respiratory failure with hypoxia: Secondary | ICD-10-CM | POA: Diagnosis not present

## 2017-10-11 DIAGNOSIS — M1990 Unspecified osteoarthritis, unspecified site: Secondary | ICD-10-CM | POA: Diagnosis not present

## 2017-10-11 DIAGNOSIS — F331 Major depressive disorder, recurrent, moderate: Secondary | ICD-10-CM | POA: Diagnosis not present

## 2017-10-11 DIAGNOSIS — E118 Type 2 diabetes mellitus with unspecified complications: Secondary | ICD-10-CM | POA: Diagnosis not present

## 2017-10-11 DIAGNOSIS — I10 Essential (primary) hypertension: Secondary | ICD-10-CM | POA: Diagnosis not present

## 2017-10-11 DIAGNOSIS — F064 Anxiety disorder due to known physiological condition: Secondary | ICD-10-CM | POA: Diagnosis not present

## 2017-10-11 HISTORY — DX: Repeated falls: R29.6

## 2017-10-11 LAB — POCT GLYCOSYLATED HEMOGLOBIN (HGB A1C): HbA1c, POC (controlled diabetic range): 5.3 % (ref 0.0–7.0)

## 2017-10-11 NOTE — Assessment & Plan Note (Signed)
Well controled with diet and weight loss.  [name] now in the non diabetic range.

## 2017-10-11 NOTE — Assessment & Plan Note (Signed)
PT reported ordered by pulm.  I emphasized that she needs it.

## 2017-10-11 NOTE — Patient Instructions (Signed)
I am very proud of you losing the weight.  Keep it up! Make sure you get physical therapy.  If your lung doctor has not arranged it, give me a call and I will order. I will call with the kidney test results. Stop your amlodipine (don't throw it away.) I hope you will no longer need it and have less falls.  Keep a list of your blood pressure and bring that list in.  We are aiming the 140/90 or below.   See me in one month with that list and we will decide if you can stay off.   Diabetes is great off medications.

## 2017-10-11 NOTE — Assessment & Plan Note (Signed)
Marked improvement with intentional weight loss.

## 2017-10-11 NOTE — Assessment & Plan Note (Signed)
DC amlodipine and watch BP

## 2017-10-11 NOTE — Progress Notes (Signed)
   Subjective:    Patient ID: Tiffany [name], female    DOB: 1961/01/09, 57 y.o.   MRN: [id number]  HPI Multiple issues. 1. DM now diet controled.  A1C today is 5.3 2. Obesity: Very significant weight loss - intentional - down 70 lbs in the las 7 months.  Great work. 3. Frequent falls.  Not sure why she falls.  Perhaps it is balance, weakness, lightheadedness or all of the above.   4. BP is nicely under control.  With weight loss, I worry that I may be over treating and conrtibuting to falls.   5. Polypharmacy.  [name] on an impressive list of psych meds, which we do not prescribe.  She will continue to work with psychiatrist.  I would trim these meds if I became responsible.     Review of Systems     Objective:   Physical Exam  Lungs clear Cardiac RRR without m or g Ext trace bilateral edema.        Assessment & Plan:

## 2017-10-12 ENCOUNTER — Telehealth: Payer: Self-pay | Admitting: Family Medicine

## 2017-10-12 ENCOUNTER — Telehealth: Payer: Self-pay

## 2017-10-12 ENCOUNTER — Other Ambulatory Visit: Payer: Self-pay | Admitting: Family Medicine

## 2017-10-12 DIAGNOSIS — M1990 Unspecified osteoarthritis, unspecified site: Secondary | ICD-10-CM | POA: Diagnosis not present

## 2017-10-12 DIAGNOSIS — J9611 Chronic respiratory failure with hypoxia: Secondary | ICD-10-CM | POA: Diagnosis not present

## 2017-10-12 DIAGNOSIS — I5032 Chronic diastolic (congestive) heart failure: Secondary | ICD-10-CM | POA: Diagnosis not present

## 2017-10-12 DIAGNOSIS — E876 Hypokalemia: Secondary | ICD-10-CM

## 2017-10-12 LAB — BASIC METABOLIC PANEL
BUN/Creatinine Ratio: 5 — ABNORMAL LOW (ref 9–23)
BUN: 9 mg/dL (ref 6–24)
CO2: 32 mmol/L — ABNORMAL HIGH (ref 20–29)
Calcium: 8.8 mg/dL (ref 8.7–10.2)
Chloride: 81 mmol/L — ABNORMAL LOW (ref 96–106)
Creatinine, Ser: 1.71 mg/dL — ABNORMAL HIGH (ref 0.57–1.00)
GFR calc Af Amer: 38 mL/min/{1.73_m2} — ABNORMAL LOW (ref >59)
GFR calc non Af Amer: 33 mL/min/{1.73_m2} — ABNORMAL LOW (ref >59)
Glucose: 94 mg/dL (ref 65–99)
Potassium: 2.2 mmol/L — CL (ref 3.5–5.2)
Sodium: 134 mmol/L (ref 134–144)

## 2017-10-12 MED ORDER — POTASSIUM CHLORIDE ER 10 MEQ PO TBCR: 40.0000 meq | EXTENDED_RELEASE_TABLET | Freq: Three times a day (TID) | ORAL | 6 refills | Status: DC

## 2017-10-12 NOTE — Telephone Encounter (Signed)
Finally talked with Tiffany [name].  States she has been taking KCL 60meq 4 pills bid. (total 80 meq per day.)  She states that she has been compliant.  Instructed to take 4 pills 4x/day x 3 days (160 meq/day x 3 days) then 4 pills 3x/day (120 meq/day) thereafter.    BMP in one week.

## 2017-10-12 NOTE — Telephone Encounter (Signed)
Patient returning call about labs ( K+).  Call back is [phone]  Danley Danker, RN [facility])

## 2017-10-12 NOTE — Telephone Encounter (Signed)
**  After Hours/ Emergency Line Call*  Received a call to report that Tiffany [name] had critical result from [facility] - K 2.2. Called patient, no answer. It appears that she has potassium at home which she could use for repletion. No answer, left VM to call back ASAP. Will route to Dr. Andria Frames, who ordered labs. If patient calls back, please consider PO repletion and recheck today vs ED.   Ralene Ok, MD PGY-3, [facility]

## 2017-10-12 NOTE — Telephone Encounter (Signed)
Opened in error

## 2017-10-13 DIAGNOSIS — I5032 Chronic diastolic (congestive) heart failure: Secondary | ICD-10-CM | POA: Diagnosis not present

## 2017-10-13 DIAGNOSIS — J9611 Chronic respiratory failure with hypoxia: Secondary | ICD-10-CM | POA: Diagnosis not present

## 2017-10-13 DIAGNOSIS — M1990 Unspecified osteoarthritis, unspecified site: Secondary | ICD-10-CM | POA: Diagnosis not present

## 2017-10-14 DIAGNOSIS — J449 Chronic obstructive pulmonary disease, unspecified: Secondary | ICD-10-CM | POA: Diagnosis not present

## 2017-10-14 DIAGNOSIS — R062 Wheezing: Secondary | ICD-10-CM | POA: Diagnosis not present

## 2017-10-14 DIAGNOSIS — R0602 Shortness of breath: Secondary | ICD-10-CM | POA: Diagnosis not present

## 2017-10-14 DIAGNOSIS — I5032 Chronic diastolic (congestive) heart failure: Secondary | ICD-10-CM | POA: Diagnosis not present

## 2017-10-14 DIAGNOSIS — I509 Heart failure, unspecified: Secondary | ICD-10-CM | POA: Diagnosis not present

## 2017-10-14 DIAGNOSIS — M6281 Muscle weakness (generalized): Secondary | ICD-10-CM | POA: Diagnosis not present

## 2017-10-14 DIAGNOSIS — F17210 Nicotine dependence, cigarettes, uncomplicated: Secondary | ICD-10-CM | POA: Diagnosis not present

## 2017-10-14 DIAGNOSIS — I272 Pulmonary hypertension, unspecified: Secondary | ICD-10-CM | POA: Diagnosis not present

## 2017-10-14 DIAGNOSIS — J9612 Chronic respiratory failure with hypercapnia: Secondary | ICD-10-CM | POA: Diagnosis not present

## 2017-10-14 DIAGNOSIS — J45909 Unspecified asthma, uncomplicated: Secondary | ICD-10-CM | POA: Diagnosis not present

## 2017-10-14 DIAGNOSIS — F209 Schizophrenia, unspecified: Secondary | ICD-10-CM | POA: Diagnosis not present

## 2017-10-14 DIAGNOSIS — J9611 Chronic respiratory failure with hypoxia: Secondary | ICD-10-CM | POA: Diagnosis not present

## 2017-10-14 DIAGNOSIS — G4733 Obstructive sleep apnea (adult) (pediatric): Secondary | ICD-10-CM | POA: Diagnosis not present

## 2017-10-14 DIAGNOSIS — E669 Obesity, unspecified: Secondary | ICD-10-CM | POA: Diagnosis not present

## 2017-10-14 DIAGNOSIS — J440 Chronic obstructive pulmonary disease with acute lower respiratory infection: Secondary | ICD-10-CM | POA: Diagnosis not present

## 2017-10-14 DIAGNOSIS — J189 Pneumonia, unspecified organism: Secondary | ICD-10-CM | POA: Diagnosis not present

## 2017-10-15 DIAGNOSIS — M1990 Unspecified osteoarthritis, unspecified site: Secondary | ICD-10-CM | POA: Diagnosis not present

## 2017-10-15 DIAGNOSIS — J9611 Chronic respiratory failure with hypoxia: Secondary | ICD-10-CM | POA: Diagnosis not present

## 2017-10-15 DIAGNOSIS — I5032 Chronic diastolic (congestive) heart failure: Secondary | ICD-10-CM | POA: Diagnosis not present

## 2017-10-16 DIAGNOSIS — I5032 Chronic diastolic (congestive) heart failure: Secondary | ICD-10-CM | POA: Diagnosis not present

## 2017-10-16 DIAGNOSIS — J9611 Chronic respiratory failure with hypoxia: Secondary | ICD-10-CM | POA: Diagnosis not present

## 2017-10-16 DIAGNOSIS — M1990 Unspecified osteoarthritis, unspecified site: Secondary | ICD-10-CM | POA: Diagnosis not present

## 2017-10-17 ENCOUNTER — Telehealth: Payer: Self-pay | Admitting: Family Medicine

## 2017-10-17 ENCOUNTER — Other Ambulatory Visit: Payer: Medicare HMO

## 2017-10-17 DIAGNOSIS — J9611 Chronic respiratory failure with hypoxia: Secondary | ICD-10-CM | POA: Diagnosis not present

## 2017-10-17 DIAGNOSIS — I5032 Chronic diastolic (congestive) heart failure: Secondary | ICD-10-CM | POA: Diagnosis not present

## 2017-10-17 DIAGNOSIS — E876 Hypokalemia: Secondary | ICD-10-CM

## 2017-10-17 DIAGNOSIS — M109 Gout, unspecified: Secondary | ICD-10-CM

## 2017-10-17 DIAGNOSIS — M1990 Unspecified osteoarthritis, unspecified site: Secondary | ICD-10-CM | POA: Diagnosis not present

## 2017-10-17 DIAGNOSIS — D649 Anemia, unspecified: Secondary | ICD-10-CM

## 2017-10-17 MED ORDER — OMEPRAZOLE 40 MG PO CPDR: 40.0000 mg | DELAYED_RELEASE_CAPSULE | Freq: Two times a day (BID) | ORAL | 3 refills | Status: DC

## 2017-10-17 MED ORDER — COLCHICINE 0.6 MG PO TABS: ORAL_TABLET | ORAL | 3 refills | Status: DC

## 2017-10-17 NOTE — Telephone Encounter (Signed)
Pt came in office requesting a refill on her Omeprazole and Colcrys. Best phone # to contact is [phone].

## 2017-10-17 NOTE — Telephone Encounter (Signed)
Called and verified pharmacy.  Rx sent per patient request

## 2017-10-18 ENCOUNTER — Telehealth: Payer: Self-pay | Admitting: Emergency Medicine

## 2017-10-18 DIAGNOSIS — I5032 Chronic diastolic (congestive) heart failure: Secondary | ICD-10-CM | POA: Diagnosis not present

## 2017-10-18 DIAGNOSIS — M1990 Unspecified osteoarthritis, unspecified site: Secondary | ICD-10-CM | POA: Diagnosis not present

## 2017-10-18 DIAGNOSIS — J9611 Chronic respiratory failure with hypoxia: Secondary | ICD-10-CM | POA: Diagnosis not present

## 2017-10-18 LAB — BASIC METABOLIC PANEL
BUN/Creatinine Ratio: 5 — ABNORMAL LOW (ref 9–23)
BUN: 9 mg/dL (ref 6–24)
CO2: 27 mmol/L (ref 20–29)
Calcium: 10.2 mg/dL (ref 8.7–10.2)
Chloride: 101 mmol/L (ref 96–106)
Creatinine, Ser: 1.64 mg/dL — ABNORMAL HIGH (ref 0.57–1.00)
GFR calc Af Amer: 39 mL/min/{1.73_m2} — ABNORMAL LOW (ref >59)
GFR calc non Af Amer: 34 mL/min/{1.73_m2} — ABNORMAL LOW (ref >59)
Glucose: 90 mg/dL (ref 65–99)
Potassium: 5.8 mmol/L — ABNORMAL HIGH (ref 3.5–5.2)
Sodium: 141 mmol/L (ref 134–144)

## 2017-10-18 MED ORDER — POTASSIUM CHLORIDE ER 10 MEQ PO TBCR: 30.0000 meq | EXTENDED_RELEASE_TABLET | Freq: Two times a day (BID) | ORAL | 6 refills | Status: DC

## 2017-10-18 NOTE — Telephone Encounter (Signed)
Yes Ok to order

## 2017-10-18 NOTE — Addendum Note (Signed)
Addended by: Zenia Resides on: 10/18/2017 02:21 PM   Modules accepted: Orders

## 2017-10-18 NOTE — Telephone Encounter (Signed)
Spoke with Daijanae, would like to get physical therapy orders for 2 times a week for 6 weeks and   once a  week for 6 weeks. Dr Lamonte Sakai ok to give verbal order?  Patient Instructions by Collene Gobble, MD at 10/09/2017 11:15 AM  Author: Collene Gobble, MD Author Type: Physician Filed: 10/09/2017 11:37 AM  Note Status: Addendum Cosign: Cosign Not Required Encounter Date: 10/09/2017  Editor: Collene Gobble, MD (Physician)  Prior Versions: 1. Collene Gobble, MD (Physician) at 10/09/2017 11:36 AM - Addendum   2. Collene Gobble, MD (Physician) at 10/09/2017 11:35 AM - Signed    Please continue Trelegy 1 inhalation daily.  Remember to rinse and gargle after using this medication. Please continue to use your albuterol either 2 puffs or 1 nebulizer treatment up to every 4 hours if needed for shortness of breath, wheezing, chest tightness.  We will refill this for you today. Continue oxygen at 6 to 8 L/min as you have been using it. Use your trilogy ventilator every night while sleeping. Please continue Singulair, loratadine, omeprazole as you have been taking them. You would probably benefit from getting the flu shot.  Please let us know if you change your mind about this. Your pneumonia shot is up-to-date. We will make a referral for home physical therapy through [facility]. Follow with Dr Lamonte Sakai in 4 months or sooner if you have any problems.

## 2017-10-18 NOTE — Assessment & Plan Note (Addendum)
Now over treated.  Decrease to 30 meq bid FU 3 weeks.

## 2017-10-18 NOTE — Telephone Encounter (Signed)
Spoke with Wells Guiles, she is at lunch and will call me back becaue she doesn't have her computer. Will await a return call.

## 2017-10-18 NOTE — Telephone Encounter (Signed)
Attempted to call Wells Guiles with [facility] but unable to reach her.  Left message for Wells Guiles to return call.

## 2017-10-19 ENCOUNTER — Other Ambulatory Visit: Payer: Medicare HMO | Admitting: Internal Medicine

## 2017-10-19 DIAGNOSIS — Z515 Encounter for palliative care: Secondary | ICD-10-CM

## 2017-10-19 DIAGNOSIS — J9611 Chronic respiratory failure with hypoxia: Secondary | ICD-10-CM | POA: Diagnosis not present

## 2017-10-19 DIAGNOSIS — M1990 Unspecified osteoarthritis, unspecified site: Secondary | ICD-10-CM | POA: Diagnosis not present

## 2017-10-19 DIAGNOSIS — I5032 Chronic diastolic (congestive) heart failure: Secondary | ICD-10-CM | POA: Diagnosis not present

## 2017-10-19 NOTE — Telephone Encounter (Signed)
Called and spoke to [place] verbal order given. Nothing further is needed at this time.

## 2017-10-19 NOTE — Telephone Encounter (Signed)
Wells Guiles from [facility] is calling back [phone]

## 2017-10-20 DIAGNOSIS — J9611 Chronic respiratory failure with hypoxia: Secondary | ICD-10-CM | POA: Diagnosis not present

## 2017-10-20 DIAGNOSIS — I5032 Chronic diastolic (congestive) heart failure: Secondary | ICD-10-CM | POA: Diagnosis not present

## 2017-10-20 DIAGNOSIS — M1990 Unspecified osteoarthritis, unspecified site: Secondary | ICD-10-CM | POA: Diagnosis not present

## 2017-10-20 NOTE — Progress Notes (Signed)
    PALLIATIVE CARE CONSULT VISIT   PATIENT NAME: Tiffany [name] DOB: 06/01/1960 MRN: [id number]  PRIMARY CARE PROVIDER:   Zenia Resides, MD  REFERRING PROVIDER:  Zenia Resides, [address] [place], [place] [address]  RESPONSIBLE PARTY:   Self  NOTE:  Arrived at patient's home for previously scheduled Palliative care visit.  No response at door at home.  Pt's car is not present.  Plan on rescheduling today's visit with patient.   Gonzella Lex, NP-C

## 2017-10-21 DIAGNOSIS — J189 Pneumonia, unspecified organism: Secondary | ICD-10-CM | POA: Diagnosis not present

## 2017-10-21 DIAGNOSIS — J9611 Chronic respiratory failure with hypoxia: Secondary | ICD-10-CM | POA: Diagnosis not present

## 2017-10-21 DIAGNOSIS — R062 Wheezing: Secondary | ICD-10-CM | POA: Diagnosis not present

## 2017-10-21 DIAGNOSIS — M1990 Unspecified osteoarthritis, unspecified site: Secondary | ICD-10-CM | POA: Diagnosis not present

## 2017-10-21 DIAGNOSIS — M6281 Muscle weakness (generalized): Secondary | ICD-10-CM | POA: Diagnosis not present

## 2017-10-21 DIAGNOSIS — I5032 Chronic diastolic (congestive) heart failure: Secondary | ICD-10-CM | POA: Diagnosis not present

## 2017-10-21 DIAGNOSIS — J45909 Unspecified asthma, uncomplicated: Secondary | ICD-10-CM | POA: Diagnosis not present

## 2017-10-21 DIAGNOSIS — I509 Heart failure, unspecified: Secondary | ICD-10-CM | POA: Diagnosis not present

## 2017-10-21 DIAGNOSIS — J449 Chronic obstructive pulmonary disease, unspecified: Secondary | ICD-10-CM | POA: Diagnosis not present

## 2017-10-21 DIAGNOSIS — R0602 Shortness of breath: Secondary | ICD-10-CM | POA: Diagnosis not present

## 2017-10-21 DIAGNOSIS — J440 Chronic obstructive pulmonary disease with acute lower respiratory infection: Secondary | ICD-10-CM | POA: Diagnosis not present

## 2017-10-21 DIAGNOSIS — G4733 Obstructive sleep apnea (adult) (pediatric): Secondary | ICD-10-CM | POA: Diagnosis not present

## 2017-10-22 ENCOUNTER — Telehealth: Payer: Self-pay | Admitting: Family Medicine

## 2017-10-22 ENCOUNTER — Telehealth: Payer: Self-pay | Admitting: Emergency Medicine

## 2017-10-22 DIAGNOSIS — J9611 Chronic respiratory failure with hypoxia: Secondary | ICD-10-CM | POA: Diagnosis not present

## 2017-10-22 DIAGNOSIS — I5032 Chronic diastolic (congestive) heart failure: Secondary | ICD-10-CM | POA: Diagnosis not present

## 2017-10-22 DIAGNOSIS — M1990 Unspecified osteoarthritis, unspecified site: Secondary | ICD-10-CM | POA: Diagnosis not present

## 2017-10-22 NOTE — Telephone Encounter (Signed)
She would like Dr. Andria Frames to call her to discuss how her having an aid now will affect her medicaid because she says it is running out because she did not get her information in from [name] in time.

## 2017-10-22 NOTE — Telephone Encounter (Signed)
Spoke with pt. She states that she is needing for Dr. Lamonte Sakai to speak on her behalf to have her Medicaid extended. Advised the pt that she would need to contact her PCP about this. She verbalized understanding. Nothing further was needed at this time.

## 2017-10-23 DIAGNOSIS — I5032 Chronic diastolic (congestive) heart failure: Secondary | ICD-10-CM | POA: Diagnosis not present

## 2017-10-23 DIAGNOSIS — M1990 Unspecified osteoarthritis, unspecified site: Secondary | ICD-10-CM | POA: Diagnosis not present

## 2017-10-23 DIAGNOSIS — J9611 Chronic respiratory failure with hypoxia: Secondary | ICD-10-CM | POA: Diagnosis not present

## 2017-10-24 DIAGNOSIS — F331 Major depressive disorder, recurrent, moderate: Secondary | ICD-10-CM | POA: Diagnosis not present

## 2017-10-24 DIAGNOSIS — J440 Chronic obstructive pulmonary disease with acute lower respiratory infection: Secondary | ICD-10-CM | POA: Diagnosis not present

## 2017-10-24 DIAGNOSIS — F064 Anxiety disorder due to known physiological condition: Secondary | ICD-10-CM | POA: Diagnosis not present

## 2017-10-24 DIAGNOSIS — R0602 Shortness of breath: Secondary | ICD-10-CM | POA: Diagnosis not present

## 2017-10-24 DIAGNOSIS — J189 Pneumonia, unspecified organism: Secondary | ICD-10-CM | POA: Diagnosis not present

## 2017-10-24 DIAGNOSIS — M1990 Unspecified osteoarthritis, unspecified site: Secondary | ICD-10-CM | POA: Diagnosis not present

## 2017-10-24 DIAGNOSIS — J45909 Unspecified asthma, uncomplicated: Secondary | ICD-10-CM | POA: Diagnosis not present

## 2017-10-24 DIAGNOSIS — G4733 Obstructive sleep apnea (adult) (pediatric): Secondary | ICD-10-CM | POA: Diagnosis not present

## 2017-10-24 DIAGNOSIS — R062 Wheezing: Secondary | ICD-10-CM | POA: Diagnosis not present

## 2017-10-24 DIAGNOSIS — J9611 Chronic respiratory failure with hypoxia: Secondary | ICD-10-CM | POA: Diagnosis not present

## 2017-10-24 DIAGNOSIS — I5032 Chronic diastolic (congestive) heart failure: Secondary | ICD-10-CM | POA: Diagnosis not present

## 2017-10-24 NOTE — Telephone Encounter (Signed)
Discussed.  Losing aid.  I will provide a letter of support documenting her medical problems and express my concern that the lack of an aid will cause her health to suffer.  Tiffany [name] understands my limitations and is grateful for my offer of a letter.

## 2017-10-24 NOTE — Telephone Encounter (Signed)
I agreed to provide a letter supporting a need for a home health aide.  She will pick up the letter.

## 2017-10-25 DIAGNOSIS — J449 Chronic obstructive pulmonary disease, unspecified: Secondary | ICD-10-CM | POA: Diagnosis not present

## 2017-10-25 DIAGNOSIS — E669 Obesity, unspecified: Secondary | ICD-10-CM | POA: Diagnosis not present

## 2017-10-25 DIAGNOSIS — J9612 Chronic respiratory failure with hypercapnia: Secondary | ICD-10-CM | POA: Diagnosis not present

## 2017-10-25 DIAGNOSIS — F209 Schizophrenia, unspecified: Secondary | ICD-10-CM | POA: Diagnosis not present

## 2017-10-25 DIAGNOSIS — J9611 Chronic respiratory failure with hypoxia: Secondary | ICD-10-CM | POA: Diagnosis not present

## 2017-10-25 DIAGNOSIS — I5032 Chronic diastolic (congestive) heart failure: Secondary | ICD-10-CM | POA: Diagnosis not present

## 2017-10-25 DIAGNOSIS — I272 Pulmonary hypertension, unspecified: Secondary | ICD-10-CM | POA: Diagnosis not present

## 2017-10-25 DIAGNOSIS — F17210 Nicotine dependence, cigarettes, uncomplicated: Secondary | ICD-10-CM | POA: Diagnosis not present

## 2017-10-25 DIAGNOSIS — G4733 Obstructive sleep apnea (adult) (pediatric): Secondary | ICD-10-CM | POA: Diagnosis not present

## 2017-10-26 DIAGNOSIS — M1990 Unspecified osteoarthritis, unspecified site: Secondary | ICD-10-CM | POA: Diagnosis not present

## 2017-10-26 DIAGNOSIS — F17210 Nicotine dependence, cigarettes, uncomplicated: Secondary | ICD-10-CM | POA: Diagnosis not present

## 2017-10-26 DIAGNOSIS — I5032 Chronic diastolic (congestive) heart failure: Secondary | ICD-10-CM | POA: Diagnosis not present

## 2017-10-26 DIAGNOSIS — J9612 Chronic respiratory failure with hypercapnia: Secondary | ICD-10-CM | POA: Diagnosis not present

## 2017-10-26 DIAGNOSIS — F209 Schizophrenia, unspecified: Secondary | ICD-10-CM | POA: Diagnosis not present

## 2017-10-26 DIAGNOSIS — J9611 Chronic respiratory failure with hypoxia: Secondary | ICD-10-CM | POA: Diagnosis not present

## 2017-10-26 DIAGNOSIS — G4733 Obstructive sleep apnea (adult) (pediatric): Secondary | ICD-10-CM | POA: Diagnosis not present

## 2017-10-26 DIAGNOSIS — E669 Obesity, unspecified: Secondary | ICD-10-CM | POA: Diagnosis not present

## 2017-10-26 DIAGNOSIS — I272 Pulmonary hypertension, unspecified: Secondary | ICD-10-CM | POA: Diagnosis not present

## 2017-10-26 DIAGNOSIS — J449 Chronic obstructive pulmonary disease, unspecified: Secondary | ICD-10-CM | POA: Diagnosis not present

## 2017-10-27 DIAGNOSIS — I5032 Chronic diastolic (congestive) heart failure: Secondary | ICD-10-CM | POA: Diagnosis not present

## 2017-10-27 DIAGNOSIS — J9611 Chronic respiratory failure with hypoxia: Secondary | ICD-10-CM | POA: Diagnosis not present

## 2017-10-27 DIAGNOSIS — M1990 Unspecified osteoarthritis, unspecified site: Secondary | ICD-10-CM | POA: Diagnosis not present

## 2017-10-28 DIAGNOSIS — J9611 Chronic respiratory failure with hypoxia: Secondary | ICD-10-CM | POA: Diagnosis not present

## 2017-10-28 DIAGNOSIS — I5032 Chronic diastolic (congestive) heart failure: Secondary | ICD-10-CM | POA: Diagnosis not present

## 2017-10-28 DIAGNOSIS — M1990 Unspecified osteoarthritis, unspecified site: Secondary | ICD-10-CM | POA: Diagnosis not present

## 2017-10-29 DIAGNOSIS — F411 Generalized anxiety disorder: Secondary | ICD-10-CM | POA: Diagnosis not present

## 2017-10-29 DIAGNOSIS — F330 Major depressive disorder, recurrent, mild: Secondary | ICD-10-CM | POA: Diagnosis not present

## 2017-10-30 DIAGNOSIS — M1990 Unspecified osteoarthritis, unspecified site: Secondary | ICD-10-CM | POA: Diagnosis not present

## 2017-10-30 DIAGNOSIS — I5032 Chronic diastolic (congestive) heart failure: Secondary | ICD-10-CM | POA: Diagnosis not present

## 2017-10-30 DIAGNOSIS — J9611 Chronic respiratory failure with hypoxia: Secondary | ICD-10-CM | POA: Diagnosis not present

## 2017-10-31 ENCOUNTER — Telehealth: Payer: Self-pay

## 2017-10-31 NOTE — Telephone Encounter (Signed)
Received phone call from patient requesting to schedule a follow up visit. Scheduled for 11/01/17

## 2017-11-01 ENCOUNTER — Other Ambulatory Visit: Payer: Medicare HMO | Admitting: Internal Medicine

## 2017-11-01 ENCOUNTER — Ambulatory Visit: Payer: Medicare HMO | Admitting: Licensed Clinical Social Worker

## 2017-11-01 DIAGNOSIS — M1990 Unspecified osteoarthritis, unspecified site: Secondary | ICD-10-CM | POA: Diagnosis not present

## 2017-11-01 DIAGNOSIS — I5032 Chronic diastolic (congestive) heart failure: Secondary | ICD-10-CM | POA: Diagnosis not present

## 2017-11-01 DIAGNOSIS — F17210 Nicotine dependence, cigarettes, uncomplicated: Secondary | ICD-10-CM | POA: Diagnosis not present

## 2017-11-01 DIAGNOSIS — F209 Schizophrenia, unspecified: Secondary | ICD-10-CM | POA: Diagnosis not present

## 2017-11-01 DIAGNOSIS — J9612 Chronic respiratory failure with hypercapnia: Secondary | ICD-10-CM | POA: Diagnosis not present

## 2017-11-01 DIAGNOSIS — G4733 Obstructive sleep apnea (adult) (pediatric): Secondary | ICD-10-CM | POA: Diagnosis not present

## 2017-11-01 DIAGNOSIS — I272 Pulmonary hypertension, unspecified: Secondary | ICD-10-CM | POA: Diagnosis not present

## 2017-11-01 DIAGNOSIS — E669 Obesity, unspecified: Secondary | ICD-10-CM | POA: Diagnosis not present

## 2017-11-01 DIAGNOSIS — J9611 Chronic respiratory failure with hypoxia: Secondary | ICD-10-CM | POA: Diagnosis not present

## 2017-11-01 DIAGNOSIS — J449 Chronic obstructive pulmonary disease, unspecified: Secondary | ICD-10-CM | POA: Diagnosis not present

## 2017-11-02 ENCOUNTER — Telehealth: Payer: Self-pay | Admitting: Emergency Medicine

## 2017-11-02 ENCOUNTER — Telehealth: Payer: Self-pay | Admitting: Family Medicine

## 2017-11-02 DIAGNOSIS — E669 Obesity, unspecified: Secondary | ICD-10-CM | POA: Diagnosis not present

## 2017-11-02 DIAGNOSIS — F209 Schizophrenia, unspecified: Secondary | ICD-10-CM | POA: Diagnosis not present

## 2017-11-02 DIAGNOSIS — I272 Pulmonary hypertension, unspecified: Secondary | ICD-10-CM | POA: Diagnosis not present

## 2017-11-02 DIAGNOSIS — J9611 Chronic respiratory failure with hypoxia: Secondary | ICD-10-CM | POA: Diagnosis not present

## 2017-11-02 DIAGNOSIS — G4733 Obstructive sleep apnea (adult) (pediatric): Secondary | ICD-10-CM | POA: Diagnosis not present

## 2017-11-02 DIAGNOSIS — J449 Chronic obstructive pulmonary disease, unspecified: Secondary | ICD-10-CM | POA: Diagnosis not present

## 2017-11-02 DIAGNOSIS — M1990 Unspecified osteoarthritis, unspecified site: Secondary | ICD-10-CM | POA: Diagnosis not present

## 2017-11-02 DIAGNOSIS — I5032 Chronic diastolic (congestive) heart failure: Secondary | ICD-10-CM | POA: Diagnosis not present

## 2017-11-02 DIAGNOSIS — F17210 Nicotine dependence, cigarettes, uncomplicated: Secondary | ICD-10-CM | POA: Diagnosis not present

## 2017-11-02 DIAGNOSIS — J9612 Chronic respiratory failure with hypercapnia: Secondary | ICD-10-CM | POA: Diagnosis not present

## 2017-11-02 NOTE — Telephone Encounter (Signed)
Spoke with the pt  I advised that per last ov she should be on trelegy inhaler and not spiriva  She verbalized understanding  Has trelegy  Nothing further needed

## 2017-11-02 NOTE — Telephone Encounter (Signed)
Pt called to see if Dr. Andria Frames could send a refill in for her spiriva. She took her last one this morning 11/02/17. Pt would like for someone to call her when that has been sent.

## 2017-11-03 DIAGNOSIS — J9611 Chronic respiratory failure with hypoxia: Secondary | ICD-10-CM | POA: Diagnosis not present

## 2017-11-03 DIAGNOSIS — I5032 Chronic diastolic (congestive) heart failure: Secondary | ICD-10-CM | POA: Diagnosis not present

## 2017-11-03 DIAGNOSIS — M1990 Unspecified osteoarthritis, unspecified site: Secondary | ICD-10-CM | POA: Diagnosis not present

## 2017-11-04 DIAGNOSIS — I5032 Chronic diastolic (congestive) heart failure: Secondary | ICD-10-CM | POA: Diagnosis not present

## 2017-11-04 DIAGNOSIS — J9611 Chronic respiratory failure with hypoxia: Secondary | ICD-10-CM | POA: Diagnosis not present

## 2017-11-04 DIAGNOSIS — M1990 Unspecified osteoarthritis, unspecified site: Secondary | ICD-10-CM | POA: Diagnosis not present

## 2017-11-05 DIAGNOSIS — I5032 Chronic diastolic (congestive) heart failure: Secondary | ICD-10-CM | POA: Diagnosis not present

## 2017-11-05 DIAGNOSIS — J9611 Chronic respiratory failure with hypoxia: Secondary | ICD-10-CM | POA: Diagnosis not present

## 2017-11-05 DIAGNOSIS — M1990 Unspecified osteoarthritis, unspecified site: Secondary | ICD-10-CM | POA: Diagnosis not present

## 2017-11-05 NOTE — Telephone Encounter (Signed)
Patient should not need to take the spiriva since she is on Trelegy (this would be duplicate medications.)  Called and left message informing no refill.  [name] to call back if not taking Trelegy.

## 2017-11-06 DIAGNOSIS — F331 Major depressive disorder, recurrent, moderate: Secondary | ICD-10-CM | POA: Diagnosis not present

## 2017-11-06 DIAGNOSIS — I5032 Chronic diastolic (congestive) heart failure: Secondary | ICD-10-CM | POA: Diagnosis not present

## 2017-11-06 DIAGNOSIS — J9611 Chronic respiratory failure with hypoxia: Secondary | ICD-10-CM | POA: Diagnosis not present

## 2017-11-06 DIAGNOSIS — M1990 Unspecified osteoarthritis, unspecified site: Secondary | ICD-10-CM | POA: Diagnosis not present

## 2017-11-06 DIAGNOSIS — F064 Anxiety disorder due to known physiological condition: Secondary | ICD-10-CM | POA: Diagnosis not present

## 2017-11-07 DIAGNOSIS — M1990 Unspecified osteoarthritis, unspecified site: Secondary | ICD-10-CM | POA: Diagnosis not present

## 2017-11-07 DIAGNOSIS — J9611 Chronic respiratory failure with hypoxia: Secondary | ICD-10-CM | POA: Diagnosis not present

## 2017-11-07 DIAGNOSIS — J9612 Chronic respiratory failure with hypercapnia: Secondary | ICD-10-CM | POA: Diagnosis not present

## 2017-11-07 DIAGNOSIS — J449 Chronic obstructive pulmonary disease, unspecified: Secondary | ICD-10-CM | POA: Diagnosis not present

## 2017-11-07 DIAGNOSIS — F209 Schizophrenia, unspecified: Secondary | ICD-10-CM | POA: Diagnosis not present

## 2017-11-07 DIAGNOSIS — G4733 Obstructive sleep apnea (adult) (pediatric): Secondary | ICD-10-CM | POA: Diagnosis not present

## 2017-11-07 DIAGNOSIS — I272 Pulmonary hypertension, unspecified: Secondary | ICD-10-CM | POA: Diagnosis not present

## 2017-11-07 DIAGNOSIS — E669 Obesity, unspecified: Secondary | ICD-10-CM | POA: Diagnosis not present

## 2017-11-07 DIAGNOSIS — F17210 Nicotine dependence, cigarettes, uncomplicated: Secondary | ICD-10-CM | POA: Diagnosis not present

## 2017-11-07 DIAGNOSIS — I5032 Chronic diastolic (congestive) heart failure: Secondary | ICD-10-CM | POA: Diagnosis not present

## 2017-11-08 DIAGNOSIS — M1990 Unspecified osteoarthritis, unspecified site: Secondary | ICD-10-CM | POA: Diagnosis not present

## 2017-11-08 DIAGNOSIS — J9611 Chronic respiratory failure with hypoxia: Secondary | ICD-10-CM | POA: Diagnosis not present

## 2017-11-08 DIAGNOSIS — I5032 Chronic diastolic (congestive) heart failure: Secondary | ICD-10-CM | POA: Diagnosis not present

## 2017-11-09 DIAGNOSIS — M1990 Unspecified osteoarthritis, unspecified site: Secondary | ICD-10-CM | POA: Diagnosis not present

## 2017-11-09 DIAGNOSIS — I5032 Chronic diastolic (congestive) heart failure: Secondary | ICD-10-CM | POA: Diagnosis not present

## 2017-11-09 DIAGNOSIS — J9611 Chronic respiratory failure with hypoxia: Secondary | ICD-10-CM | POA: Diagnosis not present

## 2017-11-10 DIAGNOSIS — J9611 Chronic respiratory failure with hypoxia: Secondary | ICD-10-CM | POA: Diagnosis not present

## 2017-11-10 DIAGNOSIS — I5032 Chronic diastolic (congestive) heart failure: Secondary | ICD-10-CM | POA: Diagnosis not present

## 2017-11-10 DIAGNOSIS — M1990 Unspecified osteoarthritis, unspecified site: Secondary | ICD-10-CM | POA: Diagnosis not present

## 2017-11-11 DIAGNOSIS — M1990 Unspecified osteoarthritis, unspecified site: Secondary | ICD-10-CM | POA: Diagnosis not present

## 2017-11-11 DIAGNOSIS — J9611 Chronic respiratory failure with hypoxia: Secondary | ICD-10-CM | POA: Diagnosis not present

## 2017-11-11 DIAGNOSIS — I5032 Chronic diastolic (congestive) heart failure: Secondary | ICD-10-CM | POA: Diagnosis not present

## 2017-11-12 ENCOUNTER — Ambulatory Visit (INDEPENDENT_AMBULATORY_CARE_PROVIDER_SITE_OTHER): Payer: Medicare HMO | Admitting: Family Medicine

## 2017-11-12 ENCOUNTER — Other Ambulatory Visit: Payer: Self-pay

## 2017-11-12 ENCOUNTER — Encounter: Payer: Self-pay | Admitting: Family Medicine

## 2017-11-12 DIAGNOSIS — E1122 Type 2 diabetes mellitus with diabetic chronic kidney disease: Secondary | ICD-10-CM

## 2017-11-12 DIAGNOSIS — N183 Chronic kidney disease, stage 3 unspecified: Secondary | ICD-10-CM

## 2017-11-12 DIAGNOSIS — J9611 Chronic respiratory failure with hypoxia: Secondary | ICD-10-CM | POA: Diagnosis not present

## 2017-11-12 DIAGNOSIS — I10 Essential (primary) hypertension: Secondary | ICD-10-CM | POA: Diagnosis not present

## 2017-11-12 DIAGNOSIS — F3164 Bipolar disorder, current episode mixed, severe, with psychotic features: Secondary | ICD-10-CM

## 2017-11-12 DIAGNOSIS — J449 Chronic obstructive pulmonary disease, unspecified: Secondary | ICD-10-CM | POA: Diagnosis not present

## 2017-11-12 DIAGNOSIS — M1990 Unspecified osteoarthritis, unspecified site: Secondary | ICD-10-CM | POA: Diagnosis not present

## 2017-11-12 DIAGNOSIS — D649 Anemia, unspecified: Secondary | ICD-10-CM

## 2017-11-12 DIAGNOSIS — G4733 Obstructive sleep apnea (adult) (pediatric): Secondary | ICD-10-CM

## 2017-11-12 DIAGNOSIS — I5032 Chronic diastolic (congestive) heart failure: Secondary | ICD-10-CM | POA: Diagnosis not present

## 2017-11-12 MED ORDER — FERROUS SULFATE 325 (65 FE) MG PO TABS: ORAL_TABLET | ORAL | 3 refills | Status: DC

## 2017-11-12 MED ORDER — FEBUXOSTAT 80 MG PO TABS: 80.0000 mg | ORAL_TABLET | Freq: Every day | ORAL | 3 refills | Status: DC

## 2017-11-12 MED ORDER — ASPIRIN 81 MG PO TBEC: DELAYED_RELEASE_TABLET | ORAL | 3 refills | Status: DC

## 2017-11-12 MED ORDER — OXYBUTYNIN CHLORIDE ER 10 MG PO TB24: ORAL_TABLET | ORAL | 3 refills | Status: DC

## 2017-11-12 NOTE — Patient Instructions (Signed)
I sent refills for several medicines to [facility]. Great job continuing to lose wt.   OK to use the saline nasal as often as you want.  You cannot use too much of it.  The oxygen drys out your nose. We might be able to cut back on your oxygen flow rate since you are losing so much.  Talk that over with Dr. Lamonte Sakai .   It would be good for you to use a walker instead of the cane.  A walker will give you more support.

## 2017-11-13 DIAGNOSIS — R062 Wheezing: Secondary | ICD-10-CM | POA: Diagnosis not present

## 2017-11-13 DIAGNOSIS — J189 Pneumonia, unspecified organism: Secondary | ICD-10-CM | POA: Diagnosis not present

## 2017-11-13 DIAGNOSIS — I509 Heart failure, unspecified: Secondary | ICD-10-CM | POA: Diagnosis not present

## 2017-11-13 DIAGNOSIS — J449 Chronic obstructive pulmonary disease, unspecified: Secondary | ICD-10-CM | POA: Diagnosis not present

## 2017-11-13 DIAGNOSIS — M6281 Muscle weakness (generalized): Secondary | ICD-10-CM | POA: Diagnosis not present

## 2017-11-13 DIAGNOSIS — I5032 Chronic diastolic (congestive) heart failure: Secondary | ICD-10-CM | POA: Diagnosis not present

## 2017-11-13 DIAGNOSIS — J440 Chronic obstructive pulmonary disease with acute lower respiratory infection: Secondary | ICD-10-CM | POA: Diagnosis not present

## 2017-11-13 DIAGNOSIS — G4733 Obstructive sleep apnea (adult) (pediatric): Secondary | ICD-10-CM | POA: Diagnosis not present

## 2017-11-13 DIAGNOSIS — J45909 Unspecified asthma, uncomplicated: Secondary | ICD-10-CM | POA: Diagnosis not present

## 2017-11-13 DIAGNOSIS — M1990 Unspecified osteoarthritis, unspecified site: Secondary | ICD-10-CM | POA: Diagnosis not present

## 2017-11-13 DIAGNOSIS — J9611 Chronic respiratory failure with hypoxia: Secondary | ICD-10-CM | POA: Diagnosis not present

## 2017-11-13 DIAGNOSIS — R0602 Shortness of breath: Secondary | ICD-10-CM | POA: Diagnosis not present

## 2017-11-13 NOTE — Assessment & Plan Note (Signed)
Stable on current meds.  Next visit consider DC metazolone.

## 2017-11-13 NOTE — Assessment & Plan Note (Addendum)
Stable on current meds.  Given complex medical and emotional problems, will continue strategy of q month visits.

## 2017-11-13 NOTE — Assessment & Plan Note (Signed)
Doing well with weight loss.  Will try to decrease O2 flow rate next visit.

## 2017-11-13 NOTE — Assessment & Plan Note (Signed)
Back on amlodipine.

## 2017-11-13 NOTE — Progress Notes (Signed)
   Subjective:    Patient ID: Tiffany [name], female    DOB: 1960/09/27, 57 y.o.   MRN: [id number]  HPI Several issues. 1. Continues to make great progress on morbid obesity.  BMI now 39.4 with an additional 20 lb wt loss. Goal wt =140.  Great work. 2. OSA and COPD overlap.  [name] still on high flow O2.  I suspect as she continues to lose weight that we will be able to wean O2 to a lower rate.  Complains of dry nose.  Thought she could only use NS nasal 4x/day.  Told OK to use prn.   3. DM.  Now diet controled.  Sept A1C was good.  4. CHF stable on current meds.  May be able to decrease diuretics as weight loss continues.    5. Bipolar: [name] upbeat but not manic.  Feels like she is taking control of her life with the weight loss. 6. HBP.  I tried off amlodipine.  BP was high and she restarted.      Review of Systems     Objective:   Physical Exam  VS note and wt.  Has lost 85 lbs from our documented peak of 300. Lungs clear Cardiac RRR without m or g        Assessment & Plan:

## 2017-11-13 NOTE — Assessment & Plan Note (Signed)
Great weight loss.  Now diet controled.

## 2017-11-14 DIAGNOSIS — I5032 Chronic diastolic (congestive) heart failure: Secondary | ICD-10-CM | POA: Diagnosis not present

## 2017-11-14 DIAGNOSIS — J9611 Chronic respiratory failure with hypoxia: Secondary | ICD-10-CM | POA: Diagnosis not present

## 2017-11-14 DIAGNOSIS — M1990 Unspecified osteoarthritis, unspecified site: Secondary | ICD-10-CM | POA: Diagnosis not present

## 2017-11-15 DIAGNOSIS — J449 Chronic obstructive pulmonary disease, unspecified: Secondary | ICD-10-CM | POA: Diagnosis not present

## 2017-11-15 DIAGNOSIS — J9611 Chronic respiratory failure with hypoxia: Secondary | ICD-10-CM | POA: Diagnosis not present

## 2017-11-15 DIAGNOSIS — J9612 Chronic respiratory failure with hypercapnia: Secondary | ICD-10-CM | POA: Diagnosis not present

## 2017-11-15 DIAGNOSIS — I5032 Chronic diastolic (congestive) heart failure: Secondary | ICD-10-CM | POA: Diagnosis not present

## 2017-11-15 DIAGNOSIS — I272 Pulmonary hypertension, unspecified: Secondary | ICD-10-CM | POA: Diagnosis not present

## 2017-11-15 DIAGNOSIS — F17210 Nicotine dependence, cigarettes, uncomplicated: Secondary | ICD-10-CM | POA: Diagnosis not present

## 2017-11-15 DIAGNOSIS — F209 Schizophrenia, unspecified: Secondary | ICD-10-CM | POA: Diagnosis not present

## 2017-11-15 DIAGNOSIS — G4733 Obstructive sleep apnea (adult) (pediatric): Secondary | ICD-10-CM | POA: Diagnosis not present

## 2017-11-15 DIAGNOSIS — M1990 Unspecified osteoarthritis, unspecified site: Secondary | ICD-10-CM | POA: Diagnosis not present

## 2017-11-15 DIAGNOSIS — E669 Obesity, unspecified: Secondary | ICD-10-CM | POA: Diagnosis not present

## 2017-11-16 DIAGNOSIS — I5032 Chronic diastolic (congestive) heart failure: Secondary | ICD-10-CM | POA: Diagnosis not present

## 2017-11-16 DIAGNOSIS — J9611 Chronic respiratory failure with hypoxia: Secondary | ICD-10-CM | POA: Diagnosis not present

## 2017-11-16 DIAGNOSIS — M1990 Unspecified osteoarthritis, unspecified site: Secondary | ICD-10-CM | POA: Diagnosis not present

## 2017-11-17 DIAGNOSIS — J9611 Chronic respiratory failure with hypoxia: Secondary | ICD-10-CM | POA: Diagnosis not present

## 2017-11-17 DIAGNOSIS — I5032 Chronic diastolic (congestive) heart failure: Secondary | ICD-10-CM | POA: Diagnosis not present

## 2017-11-17 DIAGNOSIS — M1990 Unspecified osteoarthritis, unspecified site: Secondary | ICD-10-CM | POA: Diagnosis not present

## 2017-11-18 DIAGNOSIS — J9611 Chronic respiratory failure with hypoxia: Secondary | ICD-10-CM | POA: Diagnosis not present

## 2017-11-18 DIAGNOSIS — M1990 Unspecified osteoarthritis, unspecified site: Secondary | ICD-10-CM | POA: Diagnosis not present

## 2017-11-18 DIAGNOSIS — I5032 Chronic diastolic (congestive) heart failure: Secondary | ICD-10-CM | POA: Diagnosis not present

## 2017-11-19 DIAGNOSIS — I5032 Chronic diastolic (congestive) heart failure: Secondary | ICD-10-CM | POA: Diagnosis not present

## 2017-11-19 DIAGNOSIS — M1990 Unspecified osteoarthritis, unspecified site: Secondary | ICD-10-CM | POA: Diagnosis not present

## 2017-11-19 DIAGNOSIS — J9611 Chronic respiratory failure with hypoxia: Secondary | ICD-10-CM | POA: Diagnosis not present

## 2017-11-19 DIAGNOSIS — F411 Generalized anxiety disorder: Secondary | ICD-10-CM | POA: Diagnosis not present

## 2017-11-19 DIAGNOSIS — F331 Major depressive disorder, recurrent, moderate: Secondary | ICD-10-CM | POA: Diagnosis not present

## 2017-11-19 DIAGNOSIS — F5081 Binge eating disorder: Secondary | ICD-10-CM | POA: Diagnosis not present

## 2017-11-20 ENCOUNTER — Other Ambulatory Visit: Payer: Medicare HMO | Admitting: Internal Medicine

## 2017-11-20 DIAGNOSIS — J9611 Chronic respiratory failure with hypoxia: Secondary | ICD-10-CM | POA: Diagnosis not present

## 2017-11-20 DIAGNOSIS — R0602 Shortness of breath: Secondary | ICD-10-CM | POA: Diagnosis not present

## 2017-11-20 DIAGNOSIS — R296 Repeated falls: Secondary | ICD-10-CM

## 2017-11-20 DIAGNOSIS — R451 Restlessness and agitation: Secondary | ICD-10-CM

## 2017-11-20 DIAGNOSIS — M1990 Unspecified osteoarthritis, unspecified site: Secondary | ICD-10-CM | POA: Diagnosis not present

## 2017-11-20 DIAGNOSIS — I5032 Chronic diastolic (congestive) heart failure: Secondary | ICD-10-CM | POA: Diagnosis not present

## 2017-11-20 DIAGNOSIS — Z515 Encounter for palliative care: Secondary | ICD-10-CM | POA: Diagnosis not present

## 2017-11-21 ENCOUNTER — Emergency Department (HOSPITAL_COMMUNITY): Payer: Medicare HMO

## 2017-11-21 ENCOUNTER — Other Ambulatory Visit (HOSPITAL_COMMUNITY): Payer: Self-pay | Admitting: Student

## 2017-11-21 ENCOUNTER — Inpatient Hospital Stay (HOSPITAL_COMMUNITY)
Admission: EM | Admit: 2017-11-21 | Discharge: 2017-11-23 | DRG: 917 | Disposition: A | Discharge status: Home / Self Care | Payer: Medicare HMO | Attending: Family Medicine | Admitting: Family Medicine

## 2017-11-21 DIAGNOSIS — I252 Old myocardial infarction: Secondary | ICD-10-CM

## 2017-11-21 DIAGNOSIS — Z87891 Personal history of nicotine dependence: Secondary | ICD-10-CM

## 2017-11-21 DIAGNOSIS — I5032 Chronic diastolic (congestive) heart failure: Secondary | ICD-10-CM | POA: Diagnosis present

## 2017-11-21 DIAGNOSIS — J181 Lobar pneumonia, unspecified organism: Secondary | ICD-10-CM | POA: Diagnosis not present

## 2017-11-21 DIAGNOSIS — R062 Wheezing: Secondary | ICD-10-CM | POA: Diagnosis not present

## 2017-11-21 DIAGNOSIS — Z6839 Body mass index (BMI) 39.0-39.9, adult: Secondary | ICD-10-CM

## 2017-11-21 DIAGNOSIS — J439 Emphysema, unspecified: Secondary | ICD-10-CM | POA: Diagnosis present

## 2017-11-21 DIAGNOSIS — R0689 Other abnormalities of breathing: Secondary | ICD-10-CM | POA: Diagnosis not present

## 2017-11-21 DIAGNOSIS — E876 Hypokalemia: Secondary | ICD-10-CM | POA: Diagnosis present

## 2017-11-21 DIAGNOSIS — G4733 Obstructive sleep apnea (adult) (pediatric): Secondary | ICD-10-CM | POA: Diagnosis present

## 2017-11-21 DIAGNOSIS — N183 Chronic kidney disease, stage 3 (moderate): Secondary | ICD-10-CM | POA: Diagnosis present

## 2017-11-21 DIAGNOSIS — F209 Schizophrenia, unspecified: Secondary | ICD-10-CM | POA: Diagnosis present

## 2017-11-21 DIAGNOSIS — J45909 Unspecified asthma, uncomplicated: Secondary | ICD-10-CM | POA: Diagnosis not present

## 2017-11-21 DIAGNOSIS — I6529 Occlusion and stenosis of unspecified carotid artery: Secondary | ICD-10-CM | POA: Diagnosis present

## 2017-11-21 DIAGNOSIS — I130 Hypertensive heart and chronic kidney disease with heart failure and stage 1 through stage 4 chronic kidney disease, or unspecified chronic kidney disease: Secondary | ICD-10-CM | POA: Diagnosis present

## 2017-11-21 DIAGNOSIS — R404 Transient alteration of awareness: Secondary | ICD-10-CM | POA: Diagnosis not present

## 2017-11-21 DIAGNOSIS — Z882 Allergy status to sulfonamides status: Secondary | ICD-10-CM

## 2017-11-21 DIAGNOSIS — D631 Anemia in chronic kidney disease: Secondary | ICD-10-CM | POA: Diagnosis present

## 2017-11-21 DIAGNOSIS — R0602 Shortness of breath: Secondary | ICD-10-CM | POA: Diagnosis not present

## 2017-11-21 DIAGNOSIS — F1910 Other psychoactive substance abuse, uncomplicated: Secondary | ICD-10-CM

## 2017-11-21 DIAGNOSIS — F3130 Bipolar disorder, current episode depressed, mild or moderate severity, unspecified: Secondary | ICD-10-CM | POA: Diagnosis present

## 2017-11-21 DIAGNOSIS — Z8673 Personal history of transient ischemic attack (TIA), and cerebral infarction without residual deficits: Secondary | ICD-10-CM

## 2017-11-21 DIAGNOSIS — R4182 Altered mental status, unspecified: Secondary | ICD-10-CM | POA: Diagnosis present

## 2017-11-21 DIAGNOSIS — J449 Chronic obstructive pulmonary disease, unspecified: Secondary | ICD-10-CM | POA: Diagnosis not present

## 2017-11-21 DIAGNOSIS — I509 Heart failure, unspecified: Secondary | ICD-10-CM | POA: Diagnosis not present

## 2017-11-21 DIAGNOSIS — G9340 Encephalopathy, unspecified: Secondary | ICD-10-CM

## 2017-11-21 DIAGNOSIS — F319 Bipolar disorder, unspecified: Secondary | ICD-10-CM | POA: Diagnosis present

## 2017-11-21 DIAGNOSIS — Z8249 Family history of ischemic heart disease and other diseases of the circulatory system: Secondary | ICD-10-CM

## 2017-11-21 DIAGNOSIS — Z888 Allergy status to other drugs, medicaments and biological substances status: Secondary | ICD-10-CM

## 2017-11-21 DIAGNOSIS — R0902 Hypoxemia: Secondary | ICD-10-CM | POA: Diagnosis not present

## 2017-11-21 DIAGNOSIS — T50904A Poisoning by unspecified drugs, medicaments and biological substances, undetermined, initial encounter: Principal | ICD-10-CM | POA: Diagnosis present

## 2017-11-21 DIAGNOSIS — R32 Unspecified urinary incontinence: Secondary | ICD-10-CM | POA: Diagnosis present

## 2017-11-21 DIAGNOSIS — E1122 Type 2 diabetes mellitus with diabetic chronic kidney disease: Secondary | ICD-10-CM | POA: Diagnosis present

## 2017-11-21 DIAGNOSIS — J440 Chronic obstructive pulmonary disease with acute lower respiratory infection: Secondary | ICD-10-CM | POA: Diagnosis not present

## 2017-11-21 DIAGNOSIS — J9601 Acute respiratory failure with hypoxia: Secondary | ICD-10-CM

## 2017-11-21 DIAGNOSIS — E1142 Type 2 diabetes mellitus with diabetic polyneuropathy: Secondary | ICD-10-CM

## 2017-11-21 DIAGNOSIS — R569 Unspecified convulsions: Secondary | ICD-10-CM | POA: Diagnosis present

## 2017-11-21 DIAGNOSIS — R4020 Unspecified coma: Secondary | ICD-10-CM | POA: Diagnosis not present

## 2017-11-21 DIAGNOSIS — Z885 Allergy status to narcotic agent status: Secondary | ICD-10-CM

## 2017-11-21 DIAGNOSIS — R29810 Facial weakness: Secondary | ICD-10-CM | POA: Diagnosis not present

## 2017-11-21 DIAGNOSIS — M109 Gout, unspecified: Secondary | ICD-10-CM | POA: Diagnosis present

## 2017-11-21 DIAGNOSIS — E785 Hyperlipidemia, unspecified: Secondary | ICD-10-CM | POA: Diagnosis present

## 2017-11-21 DIAGNOSIS — M6281 Muscle weakness (generalized): Secondary | ICD-10-CM | POA: Diagnosis not present

## 2017-11-21 DIAGNOSIS — N179 Acute kidney failure, unspecified: Secondary | ICD-10-CM

## 2017-11-21 DIAGNOSIS — Z79899 Other long term (current) drug therapy: Secondary | ICD-10-CM

## 2017-11-21 DIAGNOSIS — K219 Gastro-esophageal reflux disease without esophagitis: Secondary | ICD-10-CM | POA: Diagnosis present

## 2017-11-21 DIAGNOSIS — F1410 Cocaine abuse, uncomplicated: Secondary | ICD-10-CM | POA: Diagnosis present

## 2017-11-21 DIAGNOSIS — J302 Other seasonal allergic rhinitis: Secondary | ICD-10-CM | POA: Diagnosis present

## 2017-11-21 DIAGNOSIS — F419 Anxiety disorder, unspecified: Secondary | ICD-10-CM | POA: Diagnosis present

## 2017-11-21 DIAGNOSIS — Z9981 Dependence on supplemental oxygen: Secondary | ICD-10-CM

## 2017-11-21 DIAGNOSIS — Z4682 Encounter for fitting and adjustment of non-vascular catheter: Secondary | ICD-10-CM | POA: Diagnosis not present

## 2017-11-21 DIAGNOSIS — Z8674 Personal history of sudden cardiac arrest: Secondary | ICD-10-CM

## 2017-11-21 DIAGNOSIS — J9611 Chronic respiratory failure with hypoxia: Secondary | ICD-10-CM | POA: Diagnosis not present

## 2017-11-21 DIAGNOSIS — M1990 Unspecified osteoarthritis, unspecified site: Secondary | ICD-10-CM | POA: Diagnosis present

## 2017-11-21 DIAGNOSIS — J189 Pneumonia, unspecified organism: Secondary | ICD-10-CM | POA: Diagnosis not present

## 2017-11-21 DIAGNOSIS — Z818 Family history of other mental and behavioral disorders: Secondary | ICD-10-CM

## 2017-11-21 DIAGNOSIS — R4781 Slurred speech: Secondary | ICD-10-CM | POA: Diagnosis not present

## 2017-11-21 HISTORY — DX: Encephalopathy, unspecified: G93.40

## 2017-11-21 HISTORY — DX: Other psychoactive substance abuse, uncomplicated: F19.10

## 2017-11-21 LAB — COMPREHENSIVE METABOLIC PANEL
ALT: 47 U/L — ABNORMAL HIGH (ref 0–44)
AST: 52 U/L — ABNORMAL HIGH (ref 15–41)
Albumin: 3.1 g/dL — ABNORMAL LOW (ref 3.5–5.0)
Alkaline Phosphatase: 115 U/L (ref 38–126)
Anion gap: 14 (ref 5–15)
BUN: 33 mg/dL — ABNORMAL HIGH (ref 6–20)
CO2: 24 mmol/L (ref 22–32)
Calcium: 8.7 mg/dL — ABNORMAL LOW (ref 8.9–10.3)
Chloride: 103 mmol/L (ref 98–111)
Creatinine, Ser: 4.14 mg/dL — ABNORMAL HIGH (ref 0.44–1.00)
GFR calc Af Amer: 13 mL/min — ABNORMAL LOW (ref >60)
GFR calc non Af Amer: 11 mL/min — ABNORMAL LOW (ref >60)
Glucose, Bld: 79 mg/dL (ref 70–99)
Potassium: 2.8 mmol/L — ABNORMAL LOW (ref 3.5–5.1)
Sodium: 141 mmol/L (ref 135–145)
Total Bilirubin: 0.6 mg/dL (ref 0.3–1.2)
Total Protein: 6 g/dL — ABNORMAL LOW (ref 6.5–8.1)

## 2017-11-21 LAB — PROTIME-INR
INR: 1.23
Prothrombin Time: 15.4 seconds — ABNORMAL HIGH (ref 11.4–15.2)

## 2017-11-21 LAB — I-STAT ARTERIAL BLOOD GAS, ED
Acid-Base Excess: 4 mmol/L — ABNORMAL HIGH (ref 0.0–2.0)
Bicarbonate: 29 mmol/L — ABNORMAL HIGH (ref 20.0–28.0)
O2 Saturation: 100 %
Patient temperature: 98.6
TCO2: 30 mmol/L (ref 22–32)
pCO2 arterial: 44.9 mmHg (ref 32.0–48.0)
pH, Arterial: 7.419 (ref 7.350–7.450)
pO2, Arterial: 352 mmHg — ABNORMAL HIGH (ref 83.0–108.0)

## 2017-11-21 LAB — URINALYSIS, COMPLETE (UACMP) WITH MICROSCOPIC
Bacteria, UA: NONE SEEN
Bilirubin Urine: NEGATIVE
Glucose, UA: NEGATIVE mg/dL
Hgb urine dipstick: NEGATIVE
Ketones, ur: NEGATIVE mg/dL
Leukocytes, UA: NEGATIVE
Nitrite: NEGATIVE
Protein, ur: NEGATIVE mg/dL
Specific Gravity, Urine: 1.014 (ref 1.005–1.030)
pH: 5 (ref 5.0–8.0)

## 2017-11-21 LAB — RAPID URINE DRUG SCREEN, HOSP PERFORMED
Amphetamines: POSITIVE — AB
Barbiturates: NOT DETECTED
Benzodiazepines: POSITIVE — AB
Cocaine: NOT DETECTED
Opiates: NOT DETECTED
Tetrahydrocannabinol: NOT DETECTED

## 2017-11-21 LAB — CBC WITH DIFFERENTIAL/PLATELET
Abs Immature Granulocytes: 0.01 10*3/uL (ref 0.00–0.07)
Basophils Absolute: 0 10*3/uL (ref 0.0–0.1)
Basophils Relative: 0 %
Eosinophils Absolute: 0.1 10*3/uL (ref 0.0–0.5)
Eosinophils Relative: 2 %
HCT: 31.1 % — ABNORMAL LOW (ref 36.0–46.0)
Hemoglobin: 9.5 g/dL — ABNORMAL LOW (ref 12.0–15.0)
Immature Granulocytes: 0 %
Lymphocytes Relative: 48 %
Lymphs Abs: 2.3 10*3/uL (ref 0.7–4.0)
MCH: 28.2 pg (ref 26.0–34.0)
MCHC: 30.5 g/dL (ref 30.0–36.0)
MCV: 92.3 fL (ref 80.0–100.0)
Monocytes Absolute: 0.4 10*3/uL (ref 0.1–1.0)
Monocytes Relative: 9 %
Neutro Abs: 2 10*3/uL (ref 1.7–7.7)
Neutrophils Relative %: 41 %
Platelets: 292 10*3/uL (ref 150–400)
RBC: 3.37 MIL/uL — ABNORMAL LOW (ref 3.87–5.11)
RDW: 14.1 % (ref 11.5–15.5)
WBC: 4.9 10*3/uL (ref 4.0–10.5)
nRBC: 0 % (ref 0.0–0.2)

## 2017-11-21 LAB — I-STAT TROPONIN, ED: Troponin i, poc: 0.11 ng/mL (ref 0.00–0.08)

## 2017-11-21 LAB — ETHANOL: Alcohol, Ethyl (B): <10 mg/dL (ref <10)

## 2017-11-21 LAB — I-STAT CG4 LACTIC ACID, ED: Lactic Acid, Venous: 1.17 mmol/L (ref 0.5–1.9)

## 2017-11-21 LAB — GLUCOSE, CAPILLARY: Glucose-Capillary: 50 mg/dL — ABNORMAL LOW (ref 70–99)

## 2017-11-21 LAB — TRIGLYCERIDES: Triglycerides: 121 mg/dL (ref <150)

## 2017-11-21 LAB — MAGNESIUM: Magnesium: 1.6 mg/dL — ABNORMAL LOW (ref 1.7–2.4)

## 2017-11-21 MED ORDER — POTASSIUM CHLORIDE 10 MEQ/100ML IV SOLN
10.0000 meq | Freq: Once | INTRAVENOUS | Status: AC
  Administered 2017-11-21: 10 meq via INTRAVENOUS
  Filled 2017-11-21: qty 100

## 2017-11-21 MED ORDER — MIDAZOLAM HCL 2 MG/2ML IJ SOLN
INTRAMUSCULAR | Status: AC
  Administered 2017-11-21: 19:00:00
  Filled 2017-11-21: qty 2

## 2017-11-21 MED ORDER — DEXTROSE 50 % IV SOLN
INTRAVENOUS | Status: AC
  Administered 2017-11-21: 50 mL
  Filled 2017-11-21: qty 50

## 2017-11-21 MED ORDER — ASPIRIN 300 MG RE SUPP
300.0000 mg | RECTAL | Status: AC
  Administered 2017-11-21: 300 mg via RECTAL
  Filled 2017-11-21: qty 1

## 2017-11-21 MED ORDER — PROPOFOL 1000 MG/100ML IV EMUL
INTRAVENOUS | Status: AC
  Filled 2017-11-21: qty 100

## 2017-11-21 MED ORDER — HEPARIN SODIUM (PORCINE) 5000 UNIT/ML IJ SOLN
5000.0000 [IU] | Freq: Three times a day (TID) | INTRAMUSCULAR | Status: DC
  Administered 2017-11-21 – 2017-11-23 (×4): 5000 [IU] via SUBCUTANEOUS
  Filled 2017-11-21 (×4): qty 1

## 2017-11-21 MED ORDER — SODIUM CHLORIDE 0.9 % IV SOLN
INTRAVENOUS | Status: DC
  Administered 2017-11-21 – 2017-11-22 (×3): via INTRAVENOUS

## 2017-11-21 MED ORDER — POTASSIUM CHLORIDE 10 MEQ/100ML IV SOLN
INTRAVENOUS | Status: AC
  Filled 2017-11-21: qty 100

## 2017-11-21 MED ORDER — MIDAZOLAM HCL 2 MG/2ML IJ SOLN
INTRAMUSCULAR | Status: AC
  Administered 2017-11-21: 2 mg
  Filled 2017-11-21: qty 2

## 2017-11-21 MED ORDER — SODIUM CHLORIDE 0.9 % IV BOLUS
1000.0000 mL | Freq: Once | INTRAVENOUS | Status: AC
  Administered 2017-11-21: 1000 mL via INTRAVENOUS

## 2017-11-21 MED ORDER — FENTANYL CITRATE (PF) 100 MCG/2ML IJ SOLN
50.0000 ug | Freq: Once | INTRAMUSCULAR | Status: AC
  Administered 2017-11-21: 50 ug via INTRAVENOUS
  Filled 2017-11-21: qty 2

## 2017-11-21 MED ORDER — KCL-LACTATED RINGERS 20 MEQ/L IV SOLN
INTRAVENOUS | Status: DC
  Administered 2017-11-22 (×3): via INTRAVENOUS
  Filled 2017-11-21: qty 20
  Filled 2017-11-21 (×4): qty 1000

## 2017-11-21 MED ORDER — FENTANYL BOLUS VIA INFUSION
50.0000 ug | INTRAVENOUS | Status: DC | PRN
  Administered 2017-11-21 – 2017-11-22 (×3): 50 ug via INTRAVENOUS
  Filled 2017-11-21: qty 50

## 2017-11-21 MED ORDER — ASPIRIN 81 MG PO CHEW: 324.0000 mg | CHEWABLE_TABLET | ORAL | Status: AC

## 2017-11-21 MED ORDER — PROPOFOL 1000 MG/100ML IV EMUL
5.0000 ug/kg/min | INTRAVENOUS | Status: DC
  Administered 2017-11-21: 18:00:00 via INTRAVENOUS
  Administered 2017-11-21: 35 ug/kg/min via INTRAVENOUS
  Filled 2017-11-21 (×2): qty 100

## 2017-11-21 MED ORDER — FENTANYL 2500MCG IN NS 250ML (10MCG/ML) PREMIX INFUSION
25.0000 ug/h | INTRAVENOUS | Status: DC
  Administered 2017-11-21: 50 ug/h via INTRAVENOUS
  Filled 2017-11-21: qty 250

## 2017-11-21 NOTE — ED Notes (Signed)
Urine culture sent down with UA. 

## 2017-11-21 NOTE — ED Provider Notes (Signed)
INTUBATION Performed by: [facility]  Required items: required blood products, implants, devices, and special equipment available Patient identity confirmed: provided demographic data and hospital-assigned identification number Time out: Immediately prior to procedure a "time out" was called to verify the correct patient, procedure, equipment, support staff and site/side marked as required.  Indications: respiratory distress and altered mental status  Intubation method: Glidescope Laryngoscopy   Preoxygenation: BVM  Sedatives: 20 mg Etomidate Paralytic: 100 mg Succinylcholine  Tube Size:7.5 cuffed  Post-procedure assessment: chest rise and ETCO2 monitor Breath sounds: equal and absent over the epigastrium Tube secured with: ETT holder Chest x-ray interpreted by radiologist and me.  Chest x-ray findings: endotracheal tube in appropriate position  Patient tolerated the procedure well with no immediate complications.      Dalia Heading, PA-C 11/21/17 2201    Isla Pence, MD 11/21/17 2253

## 2017-11-21 NOTE — H&P (Signed)
NAME:  RONNA HERSKOWITZ, MRN:  [id number], DOB:  Jul 16, 1960, LOS: 0 ADMISSION DATE:  11/21/2017, CONSULTATION DATE: November 21, 2017 REFERRING MD: ED, CHIEF COMPLAINT: Altered mental status  Brief History   Unresponsiveness or intubation    Objective   Blood pressure 127/90, pulse 78, resp. rate 18, SpO2 100 %.    Vent Mode: PRVC FiO2 (%):  [40 %-100 %] 40 % Set Rate:  [18 bmp] 18 bmp Vt Set:  [400 mL-450 mL] 400 mL PEEP:  [5 cmH20] 5 cmH20 Plateau Pressure:  [15 cmH20] 15 cmH20  No intake or output data in the 24 hours ending 11/21/17 2232 There were no vitals filed for this visit.  Examination: General: Moving extremities but not oriented intubated on propofol Neuro: Moving extremities and oriented not oriented HEENT:  atraumatic , no jaundice , dry mucous membranes  Cardiovascular:  Irregular irregular , ESM 2/6 in the aortic area  Lungs:  CTA bilateral , no wheezing or crackles  Abdomen:  Soft lax +BS , no tenderness . Musculoskeletal:  WNL , normal pulses  Skin:  No rash   Assessment & Plan:  --Help with multiple possibilities most likely drug abuse amphetamines altered mental status possibility of long-acting narcotics minimal response to Narcan continue supportive care she wakes up. --Acute kidney injury aggressive IV hydration baseline is around 1.5. --Hypokalemia replace potassium. --Present withdrawals. --Counseling for drug abuse when she is waking up. Disposition / Summary of Today's Plan 11/21/17   Home when she is extubated         Labs   CBC: Recent Labs  Lab 11/21/17 1947  WBC 4.9  NEUTROABS 2.0  HGB 9.5*  HCT 31.1*  MCV 92.3  PLT 409    Basic Metabolic Panel: Recent Labs  Lab 11/21/17 1947  NA 141  K 2.8*  CL 103  CO2 24  GLUCOSE 79  BUN 33*  CREATININE 4.14*  CALCIUM 8.7*   GFR: Estimated Creatinine Clearance: 16.2 mL/min (A) (by C-G formula based on SCr of 4.14 mg/dL (H)). Recent Labs  Lab 11/21/17 1947 11/21/17 1957  WBC  4.9  --   LATICACIDVEN  --  1.17    Liver Function Tests: Recent Labs  Lab 11/21/17 1947  AST 52*  ALT 47*  ALKPHOS 115  BILITOT 0.6  PROT 6.0*  ALBUMIN 3.1*   No results for input(s): LIPASE, AMYLASE in the last 168 hours. No results for input(s): AMMONIA in the last 168 hours.  ABG    Component Value Date/Time   PHART 7.419 11/21/2017 2008   PCO2ART 44.9 11/21/2017 2008   PO2ART 352.0 (H) 11/21/2017 2008   HCO3 29.0 (H) 11/21/2017 2008   TCO2 30 11/21/2017 2008   ACIDBASEDEF 3.0 (H) 06/19/2017 1306   O2SAT 100.0 11/21/2017 2008     Coagulation Profile: Recent Labs  Lab 11/21/17 1947  INR 1.23    Cardiac Enzymes: No results for input(s): CKTOTAL, CKMB, CKMBINDEX, TROPONINI in the last 168 hours.  HbA1C: HbA1c, POC (controlled diabetic range)  Date/Time Value Ref Range Status  10/11/2017 08:53 AM 5.3 0.0 - 7.0 % Final   Hgb A1c MFr Bld  Date/Time Value Ref Range Status  01/07/2017 12:05 AM 6.0 (H) 4.8 - 5.6 % Final    Comment:    (NOTE) Pre diabetes:          5.7%-6.4% Diabetes:              >6.4% Glycemic control for   <7.0% adults with  diabetes   11/03/2016 09:47 PM 5.5 4.8 - 5.6 % Final    Comment:    (NOTE) Pre diabetes:          5.7%-6.4% Diabetes:              >6.4% Glycemic control for   <7.0% adults with diabetes     CBG: No results for input(s): GLUCAP in the last 168 hours.  Admitting History of Present Illness.   Patient 57 year old African-American female was found unresponsive in her car given Narcan and woke up briefly brought to the ED where she was given 2 mg of Narcan with no response or minimal response she was intubated for airway protection is not able to provide any history she is positive for benzos and amphetamines in the urine drug screen acute kidney injury.  Review of Systems:   Unable to obtain due to patient's conditions  Past Medical History  She,  has a past medical history of Anxiety, Arthritis, Asthma, Cardiac  arrest ([place]) (09/08/2016), Carotid artery stenosis, Chronic bronchitis ([place]), Chronic diastolic (congestive) heart failure ([place]), Chronic kidney disease, Cocaine abuse ([place]), Complication of anesthesia, Depression, Disorder of nervous system, Emphysema, GERD (gastroesophageal reflux disease), Heart attack ([place]) (1980s), History of blood transfusion (1994), Hyperlipidemia LDL goal <70, Hypertension, Incontinence, Manic depression ([place]), On home oxygen therapy, OSA on CPAP, Paranoid ([place]), Pneumonia, Schizophrenia ([place]), Seasonal allergies, Seizures ([place]), Sinus trouble, Stroke ([place]) (1980s), and Type II diabetes mellitus ([place]).   Surgical History    Past Surgical History:  Procedure Laterality Date  . CESAREAN SECTION  1997  . HERNIA REPAIR    . IR CHOLANGIOGRAM EXISTING TUBE  07/20/2016  . IR PERC CHOLECYSTOSTOMY  05/10/2016  . IR RADIOLOGIST EVAL & MGMT  06/08/2016  . IR RADIOLOGIST EVAL & MGMT  06/29/2016  . IR SINUS/FIST TUBE CHK-NON GI  07/12/2016  . RIGHT/LEFT HEART CATH AND CORONARY ANGIOGRAPHY N/A 06/19/2017   Procedure: RIGHT/LEFT HEART CATH AND CORONARY ANGIOGRAPHY;  Surgeon: Jolaine Artist, MD;  Location: [facility];  Service: Cardiovascular;  Laterality: N/A;  . TIBIA IM NAIL INSERTION Right 07/12/2016   Procedure: INTRAMEDULLARY (IM) NAIL RIGHT TIBIA;  Surgeon: Leandrew Koyanagi, MD;  Location: [facility];  Service: Orthopedics;  Laterality: Right;  . UMBILICAL HERNIA REPAIR  ~ 1963   "that's why I don't have a belly button"  . VAGINAL HYSTERECTOMY       Social History   Social History   Socioeconomic History  . Marital status: Widowed    Spouse name: Not on file  . Number of children: 3  . Years of education: Not on file  . Highest education level: Not on file  Occupational History  . 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 Financial resource strain: Not on file  . Food insecurity:    Worry: Not on file    Inability: Not on file  .  Transportation needs:    Medical: Not on file    Non-medical: Not on file  Tobacco Use  . Smoking status: Former Smoker    Packs/day: 1.50    Years: 38.00    Pack years: 57.00    Types: Cigarettes    Start date: 03/13/1977    Last attempt to quit: 04/10/2016    Years since quitting: 1.6  . Smokeless tobacco: Never Used  Substance and Sexual Activity  . Alcohol use: No    Alcohol/week: 0.0 standard drinks  . Drug use:  No    Types: Cocaine    Comment: 04/10/2016 "last used cocaine back in November 2017"  . Sexual activity: Never    Birth control/protection: Surgical  Lifestyle  . Physical activity:    Days per week: Not on file    Minutes per session: Not on file  . Stress: Not on file  Relationships  . Social connections:    Talks on phone: Not on file    Gets together: Not on file    Attends religious service: Not on file    Active member of club or organization: Not on file    Attends meetings of clubs or organizations: Not on file    Relationship status: Not on file  . Intimate partner violence:    Fear of current or ex partner: Not on file    Emotionally abused: Not on file    Physically abused: Not on file    Forced sexual activity: Not on file  Other Topics Concern  . Not on file  Social History Narrative   Has 1 son, [name]   Lives with son and his boyfriend   Her house has ramps and handrails should she ever needs them.    Her mother lives down the street from her and is a good support person in addition to her son.   She drives herself, has private transportation.    Cocaine free since 02/24/16, smoke free since 04/10/16  ,  reports that she quit smoking about 19 months ago. Her smoking use included cigarettes. She started smoking about 40 years ago. She has a 57.00 pack-year smoking history. She has never used smokeless tobacco. She reports that she does not drink alcohol or use drugs.   Family History   Her family history includes Anxiety disorder in her sister;  Bipolar disorder in her sister; Cancer in her father and mother; Depression in her brother, mother, sister, and sister; Heart failure in her unknown relative; Schizophrenia in her sister.   Allergies Allergies  Allergen Reactions  . Hydrocodone-Acetaminophen Shortness Of Breath  . Hydroxyzine Anaphylaxis and Shortness Of Breath  . Latuda [Lurasidone Hcl] Anaphylaxis  . Prednisone Anaphylaxis, Swelling and Other (See Comments)    Tongue swelling  . Tramadol Anaphylaxis  . Codeine Nausea And Vomiting  . Sulfa Antibiotics Itching  . Tape Rash     Home Medications  Prior to Admission medications   Medication Sig Start Date End Date Taking? Authorizing Provider  albuterol (PROVENTIL HFA;VENTOLIN HFA) 108 (90 Base) MCG/ACT inhaler Inhale 2 puffs into the lungs every 4 (four) hours as needed for wheezing or shortness of breath. 07/09/17   Mikell, Jeani Sow, MD  albuterol (PROVENTIL) (2.5 MG/3ML) 0.083% nebulizer solution Take 3 mLs (2.5 mg total) by nebulization every 6 (six) hours as needed for wheezing or shortness of breath. Patient taking differently: Take 2.5 mg by nebulization every 4 (four) hours as needed for wheezing or shortness of breath.  06/06/17   Mikell, Jeani Sow, MD  amLODipine (NORVASC) 5 MG tablet Take 5 mg by mouth daily.    [provider]  aspirin (GOODSENSE ASPIRIN LOW DOSE) 81 MG EC tablet TAKE 1 TABLET (81 MG TOTAL) BY MOUTH DAILY (MORNING). 11/12/17   Zenia Resides, MD  atorvastatin (LIPITOR) 80 MG tablet Take 1 tablet (80 mg total) by mouth daily. 08/29/17   Zenia Resides, MD  chlorhexidine (PERIDEX) 0.12 % solution Use as directed 15 mLs in the mouth or throat See admin instructions. Swish 1/2  oz (15 mls) for 30 seconds and then spit - twice daily 04/30/17   [provider]  colchicine (COLCRYS) 0.6 MG tablet TAKE 1 TABLET (0.6 MG TOTAL) BY MOUTH 2 (TWO) TIMES DAILY. 10/17/17   Zenia Resides, MD  diazepam (VALIUM) 5 MG tablet Take 10 mg  by mouth 3 (three) times daily.     [provider]  Diclofenac Sodium 3 % GEL Apply 1 application topically daily as needed (pain). Use on area of the pain 06/06/17   Mikell, Jeani Sow, MD  escitalopram (LEXAPRO) 20 MG tablet Take 1 tablet (20 mg total) by mouth daily. Patient taking differently: Take 40 mg by mouth daily.  02/16/17   Arfeen, Arlyce Harman, MD  Febuxostat (ULORIC) 80 MG TABS Take 1 tablet (80 mg total) by mouth daily. 11/12/17   Zenia Resides, MD  ferrous sulfate (FEROSUL) 325 (65 FE) MG tablet TAKE 1 TABLET BY MOUTH DAILY.(MORNING) 11/12/17   Zenia Resides, MD  flunisolide (NASALIDE) 25 MCG/ACT (0.025%) SOLN Place 2 sprays into the nose daily. 06/26/17   McDiarmid, Blane Ohara, MD  Fluticasone-Umeclidin-Vilant (TRELEGY ELLIPTA) 100-62.5-25 MCG/INH AEPB Inhale 1 puff into the lungs daily. 07/26/17   Collene Gobble, MD  gabapentin (NEURONTIN) 600 MG tablet Take 2 tablets (1,200 mg total) by mouth 3 (three) times daily. 08/29/17   Zenia Resides, MD  guaiFENesin 200 MG/10ML SOLN Take 20 mLs (400 mg total) by mouth 2 times daily at 12 noon and 4 pm. 08/07/17   Byrum, Rose Fillers, MD  KLOR-CON 10 10 MEQ tablet TAKE 6 TABLETS (60 MEQ TOTAL) BY MOUTH 2 (TWO) TIMES DAILY. 11/21/17   Shirley Friar, PA-C  KLOR-CON M10 10 MEQ tablet TAKE 6 TABLETS (60 MEQ TOTAL) BY MOUTH DAILY. 10/03/17   Zenia Resides, MD  lisdexamfetamine (VYVANSE) 40 MG capsule Take 60 mg by mouth daily.     [provider]  loratadine (CLARITIN) 10 MG tablet Take 1 tablet (10 mg total) by mouth daily. 08/20/17   McDiarmid, Blane Ohara, MD  metolazone (ZAROXOLYN) 2.5 MG tablet Take 1 tablet (2.5 mg total) by mouth every Monday, Wednesday, and Friday. Take 30 mins prior to taking Torsemide 05/14/17   Upstill, Nehemiah Settle, PA-C  montelukast (SINGULAIR) 10 MG tablet Take 1 tablet (10 mg total) by mouth at bedtime. 08/29/17   Zenia Resides, MD  Multiple Vitamin (MULTIVITAMIN WITH MINERALS) TABS tablet Take 1 tablet  by mouth daily.    [provider]  omeprazole (PRILOSEC) 40 MG capsule Take 1 capsule (40 mg total) by mouth 2 (two) times daily. 10/17/17   Zenia Resides, MD  oxybutynin (DITROPAN-XL) 10 MG 24 hr tablet TAKE 1 TABLET BY MOUTH EVERYDAY AT BEDTIME 11/12/17   Hensel, Jamal Collin, MD  OXYGEN 8L continuous    [provider]  potassium chloride (K-DUR) 10 MEQ tablet Take 3 tablets (30 mEq total) by mouth 2 (two) times daily. 10/18/17   Zenia Resides, MD  sodium chloride (OCEAN) 0.65 % SOLN nasal spray Place 2 sprays into both nostrils 4 (four) times daily. 06/06/17   Mikell, Jeani Sow, MD  torsemide (DEMADEX) 20 MG tablet Take 4 tablets (80 mg total) by mouth 2 (two) times daily. 06/06/17   [name], MD  traZODone (DESYREL) 100 MG tablet 50 mg in the AM and 150 mg at night 06/25/17   Mikell, Jeani Sow, MD

## 2017-11-21 NOTE — ED Notes (Signed)
I-stat troponin results of 0.11 reported to Rollene Fare, MD.

## 2017-11-21 NOTE — ED Provider Notes (Signed)
[facility] EMERGENCY DEPARTMENT Provider Note   CSN: [id number] Arrival date & time: 11/21/17  1742     History   Chief Complaint Chief Complaint  Patient presents with  . unresponsive    HPI Tiffany [name] is a 57 y.o. female.  Pt presents to the ED today unresponsive.  The pt was found in her car by bystanders.  We don't know how long she was unresponsive.  EMS gave pt 2 mg of narcan which caused her to wake up about a minute, but then stopped breathing again.  They started bagging her en route.  The pt is unable to give any hx.       Past Medical History:  Diagnosis Date  . Anxiety   . Arthritis    "all over" (04/10/2016)  . Asthma   . Cardiac arrest ([place]) 09/08/2016   PEA  . Carotid artery stenosis    1-39% bilateral by dopplers 11/2016  . Chronic bronchitis ([place])   . Chronic diastolic (congestive) heart failure ([place])   . Chronic kidney disease    "I see a kidney dr." (04/10/2016)  . Cocaine abuse ([place])   . Complication of anesthesia    decreased bp, decreased heart rate  . Depression   . Disorder of nervous system   . Emphysema   . GERD (gastroesophageal reflux disease)   . Heart attack ([place]) 1980s  . History of blood transfusion 1994   "couldn't stop bleeding from my period"  . Hyperlipidemia LDL goal <70   . Hypertension   . Incontinence   . Manic depression ([place])   . On home oxygen therapy    "6L; 24/7" (04/10/2016)  . OSA on CPAP    "wear mask sometimes" (04/10/2016)  . Paranoid ([place])    "sometimes; I'm on RX for it" (04/10/2016)  . Pneumonia    "I've had it several times; haven't had it since 06/2015" (04/10/2016)  . Schizophrenia ([place])   . Seasonal allergies   . Seizures ([place])    "don't know what kind; last one was ~ 1 yr ago" (04/10/2016)  . Sinus trouble   . Stroke [facility]) 1980s   denies residual on 04/10/2016  . Type II diabetes mellitus [facility])     Patient Active Problem List   Diagnosis Date Noted  . Frequent falls  10/11/2017  . Dependence on continuous supplemental oxygen 05/14/2017  . Gout 04/11/2017  . Screening for breast cancer 03/12/2017  . CKD (chronic kidney disease) stage 3, GFR 30-59 ml/min (HCC) 12/15/2016  . Carotid artery stenosis   . Hypokalemia   . Osteoarthritis 10/26/2016  . Cardiac arrest ([place]) 09/08/2016  . Overactive bladder 06/07/2016  . QT prolongation   . OSA and COPD overlap syndrome ([place])   . Arthritis   . Essential hypertension 03/22/2016  . Bipolar affective disorder, mixed, severe, with psychotic behavior ([place]) 11/28/2015  . History of drug abuse in remission ([place]) 11/28/2015  . Chronic diastolic congestive heart failure ([place])   . Chronic respiratory failure with hypoxia and hypercapnia ([place]) 06/22/2015  . Tobacco use disorder 07/22/2014  . COPD (chronic obstructive pulmonary disease) ([place]) 07/08/2014  . Seizure ([place]) 01/04/2013  . Health care maintenance 11/25/2012  . Chronic pain syndrome 06/18/2012  . Dyslipidemia 04/24/2011  . Anemia 04/24/2011  . Diabetic neuropathy ([place]) 04/24/2011  . Obstructive sleep apnea 10/18/2010  . Asthma 10/18/2010  . Morbid obesity ([place]) 10/18/2010  . Type 2 diabetes mellitus ([place]) 10/18/2010    Past Surgical  History:  Procedure Laterality Date  . CESAREAN SECTION  1997  . HERNIA REPAIR    . IR CHOLANGIOGRAM EXISTING TUBE  07/20/2016  . IR PERC CHOLECYSTOSTOMY  05/10/2016  . IR RADIOLOGIST EVAL & MGMT  06/08/2016  . IR RADIOLOGIST EVAL & MGMT  06/29/2016  . IR SINUS/FIST TUBE CHK-NON GI  07/12/2016  . RIGHT/LEFT HEART CATH AND CORONARY ANGIOGRAPHY N/A 06/19/2017   Procedure: RIGHT/LEFT HEART CATH AND CORONARY ANGIOGRAPHY;  Surgeon: Jolaine Artist, MD;  Location: No Name CV LAB;  Service: Cardiovascular;  Laterality: N/A;  . TIBIA IM NAIL INSERTION Right 07/12/2016   Procedure: INTRAMEDULLARY (IM) NAIL RIGHT TIBIA;  Surgeon: Leandrew Koyanagi, MD;  Location: [facility];  Service: Orthopedics;  Laterality: Right;  . UMBILICAL HERNIA  REPAIR  ~ 1963   "that's why I don't have a belly button"  . VAGINAL HYSTERECTOMY       OB History   None      Home Medications    Prior to Admission medications   Medication Sig Start Date End Date Taking? Authorizing Provider  albuterol (PROVENTIL HFA;VENTOLIN HFA) 108 (90 Base) MCG/ACT inhaler Inhale 2 puffs into the lungs every 4 (four) hours as needed for wheezing or shortness of breath. 07/09/17   Mikell, Jeani Sow, MD  albuterol (PROVENTIL) (2.5 MG/3ML) 0.083% nebulizer solution Take 3 mLs (2.5 mg total) by nebulization every 6 (six) hours as needed for wheezing or shortness of breath. Patient taking differently: Take 2.5 mg by nebulization every 4 (four) hours as needed for wheezing or shortness of breath.  06/06/17   Mikell, Jeani Sow, MD  amLODipine (NORVASC) 5 MG tablet Take 5 mg by mouth daily.    [provider]  aspirin (GOODSENSE ASPIRIN LOW DOSE) 81 MG EC tablet TAKE 1 TABLET (81 MG TOTAL) BY MOUTH DAILY (MORNING). 11/12/17   Zenia Resides, MD  atorvastatin (LIPITOR) 80 MG tablet Take 1 tablet (80 mg total) by mouth daily. 08/29/17   Zenia Resides, MD  chlorhexidine (PERIDEX) 0.12 % solution Use as directed 15 mLs in the mouth or throat See admin instructions. Swish 1/2 oz (15 mls) for 30 seconds and then spit - twice daily 04/30/17   [provider]  colchicine (COLCRYS) 0.6 MG tablet TAKE 1 TABLET (0.6 MG TOTAL) BY MOUTH 2 (TWO) TIMES DAILY. 10/17/17   Zenia Resides, MD  diazepam (VALIUM) 5 MG tablet Take 10 mg by mouth 3 (three) times daily.     [provider]  Diclofenac Sodium 3 % GEL Apply 1 application topically daily as needed (pain). Use on area of the pain 06/06/17   Mikell, Jeani Sow, MD  escitalopram (LEXAPRO) 20 MG tablet Take 1 tablet (20 mg total) by mouth daily. Patient taking differently: Take 40 mg by mouth daily.  02/16/17   Arfeen, Arlyce Harman, MD  Febuxostat (ULORIC) 80 MG TABS Take 1 tablet (80 mg total) by mouth  daily. 11/12/17   Zenia Resides, MD  ferrous sulfate (FEROSUL) 325 (65 FE) MG tablet TAKE 1 TABLET BY MOUTH DAILY.(MORNING) 11/12/17   Zenia Resides, MD  flunisolide (NASALIDE) 25 MCG/ACT (0.025%) SOLN Place 2 sprays into the nose daily. 06/26/17   McDiarmid, Blane Ohara, MD  Fluticasone-Umeclidin-Vilant (TRELEGY ELLIPTA) 100-62.5-25 MCG/INH AEPB Inhale 1 puff into the lungs daily. 07/26/17   Collene Gobble, MD  gabapentin (NEURONTIN) 600 MG tablet Take 2 tablets (1,200 mg total) by mouth 3 (three) times daily. 08/29/17   Madison Hickman  A, MD  guaiFENesin 200 MG/10ML SOLN Take 20 mLs (400 mg total) by mouth 2 times daily at 12 noon and 4 pm. 08/07/17   Byrum, Rose Fillers, MD  KLOR-CON 10 10 MEQ tablet TAKE 6 TABLETS (60 MEQ TOTAL) BY MOUTH 2 (TWO) TIMES DAILY. 11/21/17   Shirley Friar, PA-C  KLOR-CON M10 10 MEQ tablet TAKE 6 TABLETS (60 MEQ TOTAL) BY MOUTH DAILY. 10/03/17   Zenia Resides, MD  lisdexamfetamine (VYVANSE) 40 MG capsule Take 60 mg by mouth daily.     [provider]  loratadine (CLARITIN) 10 MG tablet Take 1 tablet (10 mg total) by mouth daily. 08/20/17   McDiarmid, Blane Ohara, MD  metolazone (ZAROXOLYN) 2.5 MG tablet Take 1 tablet (2.5 mg total) by mouth every Monday, Wednesday, and Friday. Take 30 mins prior to taking Torsemide 05/14/17   Upstill, Nehemiah Settle, PA-C  montelukast (SINGULAIR) 10 MG tablet Take 1 tablet (10 mg total) by mouth at bedtime. 08/29/17   Zenia Resides, MD  Multiple Vitamin (MULTIVITAMIN WITH MINERALS) TABS tablet Take 1 tablet by mouth daily.    [provider]  omeprazole (PRILOSEC) 40 MG capsule Take 1 capsule (40 mg total) by mouth 2 (two) times daily. 10/17/17   Zenia Resides, MD  oxybutynin (DITROPAN-XL) 10 MG 24 hr tablet TAKE 1 TABLET BY MOUTH EVERYDAY AT BEDTIME 11/12/17   Hensel, Jamal Collin, MD  OXYGEN 8L continuous    [provider]  potassium chloride (K-DUR) 10 MEQ tablet Take 3 tablets (30 mEq total) by mouth 2 (two)  times daily. 10/18/17   Zenia Resides, MD  sodium chloride (OCEAN) 0.65 % SOLN nasal spray Place 2 sprays into both nostrils 4 (four) times daily. 06/06/17   Mikell, Jeani Sow, MD  torsemide (DEMADEX) 20 MG tablet Take 4 tablets (80 mg total) by mouth 2 (two) times daily. 06/06/17   [name], MD  traZODone (DESYREL) 100 MG tablet 50 mg in the AM and 150 mg at night 06/25/17   Mikell, Jeani Sow, MD    Family History Family History  Problem Relation Age of Onset  . Cancer Father        prostate  . Cancer Mother        lung  . Depression Mother   . Depression Sister   . Anxiety disorder Sister   . Schizophrenia Sister   . Bipolar disorder Sister   . Depression Sister   . Depression Brother   . Heart failure Unknown        cousin    Social History Social History   Tobacco Use  . Smoking status: Former Smoker    Packs/day: 1.50    Years: 38.00    Pack years: 57.00    Types: Cigarettes    Start date: 03/13/1977    Last attempt to quit: 04/10/2016    Years since quitting: 1.6  . Smokeless tobacco: Never Used  Substance Use Topics  . Alcohol use: No    Alcohol/week: 0.0 standard drinks  . Drug use: No    Types: Cocaine    Comment: 04/10/2016 "last used cocaine back in November 2017"     Allergies   Hydrocodone-acetaminophen; Hydroxyzine; Latuda [lurasidone hcl]; Prednisone; Tramadol; Codeine; Sulfa antibiotics; and Tape   Review of Systems Review of Systems  Unable to perform ROS: Patient unresponsive     Physical Exam Updated Vital Signs BP 104/69   Pulse 72   Resp 18   SpO2 100%  Physical Exam  Constitutional: She appears well-developed and well-nourished.  HENT:  Head: Normocephalic and atraumatic.  Right Ear: External ear normal.  Left Ear: External ear normal.  Nose: Nose normal.  Mouth/Throat: Oropharynx is clear and moist.  Eyes: Pupils are equal, round, and reactive to light.  Neck: Normal range of motion. Neck supple.    Cardiovascular: Normal rate, regular rhythm, normal heart sounds and intact distal pulses.  Pulmonary/Chest: Apnea noted. [name] in respiratory distress.  Abdominal: Soft. Bowel sounds are normal.  Musculoskeletal: Normal range of motion.  Neurological: [name] unresponsive.  Skin: Skin is warm. Capillary refill takes less than 2 seconds.  Psychiatric:  Unable to assess  Nursing note and vitals reviewed.    ED Treatments / Results  Labs (all labs ordered are listed, but only abnormal results are displayed) Labs Reviewed  COMPREHENSIVE METABOLIC PANEL - Abnormal; Notable for the following components:      Result Value   Potassium 2.8 (*)    BUN 33 (*)    Creatinine, Ser 4.14 (*)    Calcium 8.7 (*)    Total Protein 6.0 (*)    Albumin 3.1 (*)    AST 52 (*)    ALT 47 (*)    GFR calc non Af Amer 11 (*)    GFR calc Af Amer 13 (*)    All other components within normal limits  CBC WITH DIFFERENTIAL/PLATELET - Abnormal; Notable for the following components:   RBC 3.37 (*)    Hemoglobin 9.5 (*)    HCT 31.1 (*)    All other components within normal limits  RAPID URINE DRUG SCREEN, HOSP PERFORMED - Abnormal; Notable for the following components:   Benzodiazepines POSITIVE (*)    Amphetamines POSITIVE (*)    All other components within normal limits  PROTIME-INR - Abnormal; Notable for the following components:   Prothrombin Time 15.4 (*)    All other components within normal limits  I-STAT ARTERIAL BLOOD GAS, ED - Abnormal; Notable for the following components:   pO2, Arterial 352.0 (*)    Bicarbonate 29.0 (*)    Acid-Base Excess 4.0 (*)    All other components within normal limits  CULTURE, BLOOD (ROUTINE X 2)  CULTURE, BLOOD (ROUTINE X 2)  URINALYSIS, COMPLETE (UACMP) WITH MICROSCOPIC  ETHANOL  TRIGLYCERIDES  MAGNESIUM  CBG MONITORING, ED  I-STAT CG4 LACTIC ACID, ED  I-STAT ARTERIAL BLOOD GAS, ED  I-STAT TROPONIN, ED    EKG EKG Interpretation  Date/Time:  Wednesday  November 21 2017 17:45:36 EST Ventricular Rate:  83 PR Interval:    QRS Duration: 68 QT Interval:  455 QTC Calculation: 535 R Axis:   40 Text Interpretation:  Sinus rhythm Low voltage, precordial leads Nonspecific T abnormalities, diffuse leads Prolonged QT interval No significant change since last tracing Confirmed by Isla Pence [phone]) on 11/21/2017 6:26:55 PM   Radiology Ct Head Wo Contrast  Result Date: 11/21/2017 CLINICAL DATA:  Found unresponsive. History of chronic renal disease. History of cocaine abuse. EXAM: CT HEAD WITHOUT CONTRAST TECHNIQUE: Contiguous axial images were obtained from the base of the skull through the vertex without intravenous contrast. COMPARISON:  01/05/2017. FINDINGS: Brain: No evidence for acute infarction, hemorrhage, mass lesion, hydrocephalus, or extra-axial fluid. Slight premature for age atrophy. Slight hypoattenuation of white matter, could represent early small vessel disease. Vascular: No hyperdense vessel or unexpected calcification. Skull: Calvarium intact. Scalp soft tissue densities likely represent sebaceous cysts. Sinuses/Orbits: No sinus disease.  Negative orbits. Other: None.  IMPRESSION: No acute intracranial abnormality. No cause is seen for the reported abnormality. Stable mild atrophy and small vessel disease. Electronically Signed   By: Staci Righter M.D.   On: 11/21/2017 18:57   Dg Chest Port 1 View  Result Date: 11/21/2017 CLINICAL DATA:  Intubation. EXAM: PORTABLE CHEST 1 VIEW COMPARISON:  Chest radiograph May 12, 2017 FINDINGS: RIGHT mainstem bronchus intubation, distal tip projecting 3 cm below the carina. Cardiomediastinal silhouette is unremarkable for this low inspiratory examination with crowded vasculature markings. LEFT lung base bandlike density. LEFT lower lobe consolidation with air bronchograms. Trachea projects midline and there is no pneumothorax. Included soft tissue planes and osseous structures are non-suspicious.  Nasogastric tube looped in stomach, distal tip projecting gastric cardia. IMPRESSION: 1. RIGHT mainstem bronchus intubation. Recommend 5 cm retraction. Nasogastric tube tip projects proximal stomach. 2. LEFT lower lobe atelectasis versus pneumonia. 3. Acute findings discussed with and reconfirmed by Dr.Myrikal Messmer on 11/21/2017 at 7:09 pm. Electronically Signed   By: Elon Alas M.D.   On: 11/21/2017 19:09    Procedures Procedures (including critical care time)  Medications Ordered in ED Medications  0.9 %  sodium chloride infusion ( Intravenous New Bag/Given 11/21/17 1835)  propofol (DIPRIVAN) 1000 MG/100ML infusion (35 mcg/kg/min  97.7 kg Intravenous New Bag/Given 11/21/17 2132)  potassium chloride 10 mEq in 100 mL IVPB (has no administration in time range)  midazolam (VERSED) 2 MG/2ML injection (2 mg  Given 11/21/17 1809)  propofol (DIPRIVAN) 1000 MG/100ML infusion (35 mcg/kg/min  Rate/Dose Change 11/21/17 2011)  midazolam (VERSED) 2 MG/2ML injection (  Given 11/21/17 1848)  sodium chloride 0.9 % bolus 1,000 mL (1,000 mLs Intravenous New Bag/Given 11/21/17 2038)     Initial Impression / Assessment and Plan / ED Course  I have reviewed the triage vital signs and the nursing notes.  Pertinent labs & imaging results that were available during my care of the patient were reviewed by me and considered in my medical decision making (see chart for details).  Pt given an additional 2 mg of narcan IV upon arrival.  Minimal response.  Due to apnea, pt was orally intubated by PA Lawyer under my direct supervision.  Pt put on propofol for sedation.  ETT in the right mainstem.  This was pulled back.    Etiology of unresponsiveness is unclear.  ? Overdose.  Pt d/w Dr. Mortimer Fries (CCM) who will have someone see her.  CRITICAL CARE Performed by: Isla Pence   Total critical care time: 60 minutes  Critical care time was exclusive of separately billable procedures and treating other  patients.  Critical care was necessary to treat or prevent imminent or life-threatening deterioration.  Critical care was time spent personally by me on the following activities: development of treatment plan with patient and/or surrogate as well as nursing, discussions with consultants, evaluation of patient's response to treatment, examination of patient, obtaining history from patient or surrogate, ordering and performing treatments and interventions, ordering and review of laboratory studies, ordering and review of radiographic studies, pulse oximetry and re-evaluation of patient's condition.  Final Clinical Impressions(s) / ED Diagnoses   Final diagnoses:  Acute respiratory failure with hypoxia ([place])  AKI (acute kidney injury) ([place])  Hypokalemia    ED Discharge Orders    None       Isla Pence, MD 11/21/17 2203

## 2017-11-21 NOTE — ED Notes (Signed)
Phlebotomy notified of need for blood.

## 2017-11-21 NOTE — Progress Notes (Signed)
Endotracheal tube pulled back 3cm from 24cm to 21cm per emergency doctor for right main stem intubation.

## 2017-11-21 NOTE — ED Notes (Signed)
50 mcg bolus propofol initiated

## 2017-11-21 NOTE — Progress Notes (Signed)
Arterial gas drawn on the following settings PRVC 18/400/100%/+5

## 2017-11-21 NOTE — ED Notes (Signed)
Reports called, respiratory informed ready to transport

## 2017-11-21 NOTE — ED Notes (Signed)
Pt arrives to ED from parking lot when pt was found unresponseive in her car since this evening. EMS reports it is unknown how long patient was unresponsive before she was found. 2mg  narcan given en route, minimal response, pt unresponsive upon arrifval to ED, being bagged by EMS. 2mg  narcan given by this RN per verbal order from MD. PT given 2mg  etomidate and 100 of succ at 1755 prior to being intubated. ETT 23 at the teeth. Pt placed in position of comfort with bed locked and lowered, call bell in reach. EMS VS 128/70 RR 20 (dropped to 6 and began bagging) NSR in 80s. CBG 93.

## 2017-11-22 ENCOUNTER — Other Ambulatory Visit: Payer: Self-pay

## 2017-11-22 ENCOUNTER — Encounter (HOSPITAL_COMMUNITY): Payer: Self-pay

## 2017-11-22 DIAGNOSIS — R451 Restlessness and agitation: Secondary | ICD-10-CM

## 2017-11-22 HISTORY — DX: Restlessness and agitation: R45.1

## 2017-11-22 LAB — CBC WITH DIFFERENTIAL/PLATELET
Abs Immature Granulocytes: 0.03 10*3/uL (ref 0.00–0.07)
Basophils Absolute: 0 10*3/uL (ref 0.0–0.1)
Basophils Relative: 0 %
Eosinophils Absolute: 0.2 10*3/uL (ref 0.0–0.5)
Eosinophils Relative: 3 %
HCT: 29.4 % — ABNORMAL LOW (ref 36.0–46.0)
Hemoglobin: 9 g/dL — ABNORMAL LOW (ref 12.0–15.0)
Immature Granulocytes: 1 %
Lymphocytes Relative: 47 %
Lymphs Abs: 2.9 10*3/uL (ref 0.7–4.0)
MCH: 28 pg (ref 26.0–34.0)
MCHC: 30.6 g/dL (ref 30.0–36.0)
MCV: 91.3 fL (ref 80.0–100.0)
Monocytes Absolute: 0.5 10*3/uL (ref 0.1–1.0)
Monocytes Relative: 9 %
Neutro Abs: 2.4 10*3/uL (ref 1.7–7.7)
Neutrophils Relative %: 40 %
Platelets: 253 10*3/uL (ref 150–400)
RBC: 3.22 MIL/uL — ABNORMAL LOW (ref 3.87–5.11)
RDW: 14.2 % (ref 11.5–15.5)
WBC: 6.1 10*3/uL (ref 4.0–10.5)
nRBC: 0 % (ref 0.0–0.2)

## 2017-11-22 LAB — COMPREHENSIVE METABOLIC PANEL
ALT: 44 U/L (ref 0–44)
AST: 48 U/L — ABNORMAL HIGH (ref 15–41)
Albumin: 2.7 g/dL — ABNORMAL LOW (ref 3.5–5.0)
Alkaline Phosphatase: 104 U/L (ref 38–126)
Anion gap: 9 (ref 5–15)
BUN: 30 mg/dL — ABNORMAL HIGH (ref 6–20)
CO2: 25 mmol/L (ref 22–32)
Calcium: 8.2 mg/dL — ABNORMAL LOW (ref 8.9–10.3)
Chloride: 108 mmol/L (ref 98–111)
Creatinine, Ser: 3.49 mg/dL — ABNORMAL HIGH (ref 0.44–1.00)
GFR calc Af Amer: 16 mL/min — ABNORMAL LOW (ref >60)
GFR calc non Af Amer: 13 mL/min — ABNORMAL LOW (ref >60)
Glucose, Bld: 76 mg/dL (ref 70–99)
Potassium: 2.8 mmol/L — ABNORMAL LOW (ref 3.5–5.1)
Sodium: 142 mmol/L (ref 135–145)
Total Bilirubin: 0.5 mg/dL (ref 0.3–1.2)
Total Protein: 5.1 g/dL — ABNORMAL LOW (ref 6.5–8.1)

## 2017-11-22 LAB — GLUCOSE, CAPILLARY
Glucose-Capillary: 109 mg/dL — ABNORMAL HIGH (ref 70–99)
Glucose-Capillary: 129 mg/dL — ABNORMAL HIGH (ref 70–99)
Glucose-Capillary: 60 mg/dL — ABNORMAL LOW (ref 70–99)

## 2017-11-22 LAB — MAGNESIUM: Magnesium: 1.5 mg/dL — ABNORMAL LOW (ref 1.7–2.4)

## 2017-11-22 LAB — MRSA PCR SCREENING: MRSA by PCR: NEGATIVE

## 2017-11-22 MED ORDER — LORATADINE 10 MG PO TABS
10.0000 mg | ORAL_TABLET | Freq: Every day | ORAL | Status: DC
  Administered 2017-11-22 – 2017-11-23 (×2): 10 mg via ORAL
  Filled 2017-11-22 (×3): qty 1

## 2017-11-22 MED ORDER — POTASSIUM CHLORIDE 10 MEQ/100ML IV SOLN
10.0000 meq | INTRAVENOUS | Status: AC
  Administered 2017-11-22 (×6): 10 meq via INTRAVENOUS
  Filled 2017-11-22 (×6): qty 100

## 2017-11-22 MED ORDER — DEXTROSE 50 % IV SOLN
INTRAVENOUS | Status: AC
  Administered 2017-11-22: 50 mL
  Filled 2017-11-22: qty 50

## 2017-11-22 MED ORDER — ATORVASTATIN CALCIUM 80 MG PO TABS
80.0000 mg | ORAL_TABLET | Freq: Every day | ORAL | Status: DC
  Administered 2017-11-22 – 2017-11-23 (×2): 80 mg via ORAL
  Filled 2017-11-22 (×3): qty 1

## 2017-11-22 MED ORDER — AMLODIPINE BESYLATE 5 MG PO TABS
5.0000 mg | ORAL_TABLET | Freq: Every day | ORAL | Status: DC
  Administered 2017-11-22 – 2017-11-23 (×2): 5 mg via ORAL
  Filled 2017-11-22 (×2): qty 1

## 2017-11-22 MED ORDER — CHLORHEXIDINE GLUCONATE 0.12% ORAL RINSE (MEDLINE KIT)
15.0000 mL | Freq: Two times a day (BID) | OROMUCOSAL | Status: DC
  Administered 2017-11-22 (×2): 15 mL via OROMUCOSAL

## 2017-11-22 MED ORDER — ORAL CARE MOUTH RINSE
15.0000 mL | OROMUCOSAL | Status: DC
  Administered 2017-11-22 (×5): 15 mL via OROMUCOSAL

## 2017-11-22 MED ORDER — MONTELUKAST SODIUM 10 MG PO TABS
10.0000 mg | ORAL_TABLET | Freq: Every day | ORAL | Status: DC
  Administered 2017-11-22: 10 mg via ORAL
  Filled 2017-11-22 (×2): qty 1

## 2017-11-22 MED ORDER — MAGNESIUM SULFATE 2 GM/50ML IV SOLN
2.0000 g | Freq: Once | INTRAVENOUS | Status: AC
  Administered 2017-11-22: 2 g via INTRAVENOUS
  Filled 2017-11-22: qty 50

## 2017-11-22 NOTE — Evaluation (Signed)
Physical Therapy Evaluation Patient Details Name: Tiffany [name] MRN: [id number] DOB: [birth year] Today's Date: 11/22/2017   History of Present Illness  57 y.o female found unresponsive in parking lot, intubated 11/06, extubated 11/07. PMH includes: CVA, MI, Cocaine abuse, COPD, anemia, arthritis, bipolar, cardiac arrest, carotid stenosis, diastolic HF, CKD III, gout, HTN, OSA, morbid obesity.     Clinical Impression  Pt admitted with above diagnosis. Pt currently with functional limitations due to the deficits listed below (see PT Problem List). Patient unable to provide accurate history this visit per chart from home. Restless, sitting to EOB with min guard to min A. Declines OOB mobility citing shes had a long day. Will continue to assess and update recs to SNF if appropriate.  Pt will benefit from skilled PT to increase their independence and safety with mobility to allow discharge to the venue listed below.       Follow Up Recommendations Home health PT;Supervision/Assistance - 24 hour    Equipment Recommendations  Rolling walker with 5" wheels    Recommendations for Other Services OT consult     Precautions / Restrictions Restrictions Weight Bearing Restrictions: No      Mobility  Bed Mobility Overal bed mobility: Needs Assistance Bed Mobility: Supine to Sit     Supine to sit: Min assist     General bed mobility comments: Min A to come to sitting  Transfers                 General transfer comment: deferred due to patients request "ive been through a alot today let me rest"  Ambulation/Gait                Stairs            Wheelchair Mobility    Modified Rankin (Stroke Patients Only)       Balance                                             Pertinent Vitals/Pain Pain Assessment: No/denies pain    Home Living Family/patient expects to be discharged to:: Private residence Living Arrangements: Other  (Comment) Available Help at Discharge: Family;Available 24 hours/day Type of Home: House Home Access: Ramped entrance     Home Layout: One level Home Equipment: Grab bars - tub/shower;Cane - single point;Walker - 2 wheels;Bedside commode;Walker - 4 wheels;Wheelchair - Insurance risk surveyor Comments: home O2-6L    Prior Function Level of Independence: Independent with assistive device(s);Needs assistance   Gait / Transfers Assistance Needed: Patient reports she uses cane for ambulation.    ADL's / Homemaking Assistance Needed: assisted for LB bathing and dressing, some light meal prep, son and friend do housekeeping and heavy meal prep, pt sometimes needs assist for pericare  Comments: pt has AE, but reports she is not interested in using     Hand Dominance        Extremity/Trunk Assessment   Upper Extremity Assessment Upper Extremity Assessment: Generalized weakness    Lower Extremity Assessment Lower Extremity Assessment: Generalized weakness       Communication   Communication: No difficulties  Cognition Arousal/Alertness: Lethargic;Awake/alert Behavior During Therapy: Anxious;Restless;Agitated Overall Cognitive Status: No family/caregiver present to determine baseline cognitive functioning  General Comments: confused, asking staff to get her back into bed then asking why we arn't going to the chair to dinner, when asked if she wants to stand up and get to chair says no.       General Comments      Exercises     Assessment/Plan    PT Assessment Patient needs continued PT services  PT Problem List Decreased activity tolerance;Decreased balance;Decreased strength       PT Treatment Interventions DME instruction;Gait training;Stair training;Functional mobility training;Therapeutic activities;Therapeutic exercise    PT Goals (Current goals can be found in the Care Plan section)  Acute Rehab PT  Goals Patient Stated Goal: non stated PT Goal Formulation: With patient Potential to Achieve Goals: Good    Frequency Min 3X/week   Barriers to discharge        Co-evaluation               AM-PAC PT "6 Clicks" Daily Activity  Outcome Measure Difficulty turning over in bed (including adjusting bedclothes, sheets and blankets)?: Unable Difficulty moving from lying on back to sitting on the side of the bed? : Unable Difficulty sitting down on and standing up from a chair with arms (e.g., wheelchair, bedside commode, etc,.)?: Unable Help needed moving to and from a bed to chair (including a wheelchair)?: A Lot Help needed walking in hospital room?: A Lot Help needed climbing 3-5 steps with a railing? : Total 6 Click Score: 8    End of Session Equipment Utilized During Treatment: Gait belt Activity Tolerance: Patient limited by fatigue Patient left: in bed;with call bell/phone within reach;with bed alarm set Nurse Communication: Mobility status PT Visit Diagnosis: Unsteadiness on feet (R26.81);Difficulty in walking, not elsewhere classified (R26.2)    Time: 0354-6568 PT Time Calculation (min) (ACUTE ONLY): 28 min   Charges:   PT Evaluation $PT Eval Low Complexity: 1 Low PT Treatments $Therapeutic Activity: 8-22 mins        Reinaldo Berber, PT, DPT Acute Rehabilitation Services Pager: [phone] Office: [phone]    Reinaldo Berber 11/22/2017, 6:24 PM

## 2017-11-22 NOTE — Progress Notes (Signed)
NAME:  MUNACHIMSO RIGDON, MRN:  [id number], DOB:  04/12/1960, LOS: 1 ADMISSION DATE:  11/21/2017, CONSULTATION DATE:  11/6 REFERRING MD:  Gilford Raid, CHIEF COMPLAINT:  AMS  Brief History   57 year old female found unresponsive in parking lot. Initially responded to 2mg  narcan but again became unresponsive after a few minutes. Intubated and admitted to critical care service.  Past Medical History  COPD, anemia, arthritis, bipolar, cardiac arrest, carotid stenosis, diastolic heart failure, ckd stage III, dyslipidemia, gout, htn, OSA, morbid obesity Significant Hospital Events   11/6: admitted, ET tube placed  Consults: date of consult/date signed off & final recs:  none  Procedures (surgical and bedside):  11/6: ETT placed  Significant Diagnostic Tests:  UA: amphetamines, benzos  Micro Data:  MRSA PCR: neg Blood cx pending  Antimicrobials:  none  Subjective:  Awake, alert this am off sedation. Able to follow commands  Objective   Blood pressure 113/74, pulse 64, temperature 97.7 F (36.5 C), temperature source Oral, resp. rate 16, weight 97.7 kg, SpO2 100 %.    Vent Mode: PSV;CPAP FiO2 (%):  [40 %-100 %] 40 % Set Rate:  [18 bmp] 18 bmp Vt Set:  [400 mL-450 mL] 400 mL PEEP:  [5 cmH20] 5 cmH20 Pressure Support:  [10 cmH20] 10 cmH20 Plateau Pressure:  [15 cmH20] 15 cmH20   Intake/Output Summary (Last 24 hours) at 11/22/2017 0913 Last data filed at 11/22/2017 0900 Gross per 24 hour  Intake 5567.32 ml  Output 995 ml  Net 4572.32 ml   Filed Weights   11/22/17 0450  Weight: 97.7 kg    Examination: General: obese AA female, awake and alert. Able to follow commands HENT: ETT in place, NG tube in place. [place]/AT Lungs: lungs with coarse breath sounds bilaterally Cardiovascular: regular rate and rhythm, no m/r/g, palpable peripheral pulses  Abdomen: soft, non-distended. BS + Extremities: able to move all extremities Neuro: awake and alert on no sedation. GU: foley catheter  in place  Resolved Hospital Problem list   none  Assessment & Plan:  57 year old presenting with AMS, AKI. Unclear etiology although could be multifactorial. Initially responded a little to narcan but become altered again lending itself to possible overdose etiology. Also with poorly controlled copd which could contribute although does not have lung findings to suggest this pathology. Intubated on admission. Doing well on no sedation, can follow commands and is alert. Can plan for extubation this am. If does well, can send to step down and have FPTS pick up 11/8.  AMS - trial extubation 11/7 - likely out of ICU 11/8 if does well  AKI Likely prerenal given improvement with some fluids - limit nephrotoxins - NS @ 125 mL/hr - could consider bolus once extubated - on torsemide and metolazone as outpt  COPD Albuterol as neded as outpt, trelegy for controller  T2DM Will likely require SSI once extubated and no longer NPO   Disposition / Summary of Today's Plan 11/22/17   Trial extubation Continue fluids Likely DC NG and foley once extubated     Diet: NPO w/ NG Pain/Anxiety/Delirium protocol (if indicated):  VAP protocol (if indicated): fentanyl as needed, currently off DVT prophylaxis: heparin 5000 U tid GI prophylaxis: protonix if unable to extubate Hyperglycemia protocol: SSI once tolerating PO Mobility: bedbound Code Status: full Family Communication: none at bedside  Labs   CBC: Recent Labs  Lab 11/21/17 1947 11/22/17 0253  WBC 4.9 6.1  NEUTROABS 2.0 2.4  HGB 9.5* 9.0*  HCT 31.1* 29.4*  MCV 92.3 91.3  PLT 292 664    Basic Metabolic Panel: Recent Labs  Lab 11/21/17 1947 11/21/17 2136 11/22/17 0253  NA 141  --  142  K 2.8*  --  2.8*  CL 103  --  108  CO2 24  --  25  GLUCOSE 79  --  76  BUN 33*  --  30*  CREATININE 4.14*  --  3.49*  CALCIUM 8.7*  --  8.2*  MG  --  1.6* 1.5*   GFR: Estimated Creatinine Clearance: 19.2 mL/min (A) (by C-G formula  based on SCr of 3.49 mg/dL (H)). Recent Labs  Lab 11/21/17 1947 11/21/17 1957 11/22/17 0253  WBC 4.9  --  6.1  LATICACIDVEN  --  1.17  --     Liver Function Tests: Recent Labs  Lab 11/21/17 1947 11/22/17 0253  AST 52* 48*  ALT 47* 44  ALKPHOS 115 104  BILITOT 0.6 0.5  PROT 6.0* 5.1*  ALBUMIN 3.1* 2.7*   No results for input(s): LIPASE, AMYLASE in the last 168 hours. No results for input(s): AMMONIA in the last 168 hours.  ABG    Component Value Date/Time   PHART 7.419 11/21/2017 2008   PCO2ART 44.9 11/21/2017 2008   PO2ART 352.0 (H) 11/21/2017 2008   HCO3 29.0 (H) 11/21/2017 2008   TCO2 30 11/21/2017 2008   ACIDBASEDEF 3.0 (H) 06/19/2017 1306   O2SAT 100.0 11/21/2017 2008     Coagulation Profile: Recent Labs  Lab 11/21/17 1947  INR 1.23    Cardiac Enzymes: No results for input(s): CKTOTAL, CKMB, CKMBINDEX, TROPONINI in the last 168 hours.  HbA1C: HbA1c, POC (controlled diabetic range)  Date/Time Value Ref Range Status  10/11/2017 08:53 AM 5.3 0.0 - 7.0 % Final   Hgb A1c MFr Bld  Date/Time Value Ref Range Status  01/07/2017 12:05 AM 6.0 (H) 4.8 - 5.6 % Final    Comment:    (NOTE) Pre diabetes:          5.7%-6.4% Diabetes:              >6.4% Glycemic control for   <7.0% adults with diabetes   11/03/2016 09:47 PM 5.5 4.8 - 5.6 % Final    Comment:    (NOTE) Pre diabetes:          5.7%-6.4% Diabetes:              >6.4% Glycemic control for   <7.0% adults with diabetes     CBG: Recent Labs  Lab 11/21/17 2342 11/22/17 0025 11/22/17 0340 11/22/17 0428  GLUCAP 50* 109* 60* 129*    Admitting History of Present Illness.   Patient 57 year old African-American female was found unresponsive in her car given Narcan and woke up briefly brought to the ED where she was given 2 mg of Narcan with no response or minimal response she was intubated for airway protection is not able to provide any history she is positive for benzos and amphetamines in the  urine drug screen acute kidney injury.  Review of Systems:   intubated  Past Medical History  She,  has a past medical history of Anxiety, Arthritis, Asthma, Cardiac arrest ([place]) (09/08/2016), Carotid artery stenosis, Chronic bronchitis ([place]), Chronic diastolic (congestive) heart failure ([place]), Chronic kidney disease, Cocaine abuse ([place]), Complication of anesthesia, Depression, Disorder of nervous system, Emphysema, GERD (gastroesophageal reflux disease), Heart attack ([place]) (1980s), History of blood transfusion (1994), Hyperlipidemia LDL goal <70, Hypertension, Incontinence, Manic depression ([place]),  On home oxygen therapy, OSA on CPAP, Paranoid ([place]), Pneumonia, Schizophrenia ([place]), Seasonal allergies, Seizures ([place]), Sinus trouble, Stroke ([place]) (1980s), and Type II diabetes mellitus ([place]).   Surgical History    Past Surgical History:  Procedure Laterality Date  . CESAREAN SECTION  1997  . HERNIA REPAIR    . IR CHOLANGIOGRAM EXISTING TUBE  07/20/2016  . IR PERC CHOLECYSTOSTOMY  05/10/2016  . IR RADIOLOGIST EVAL & MGMT  06/08/2016  . IR RADIOLOGIST EVAL & MGMT  06/29/2016  . IR SINUS/FIST TUBE CHK-NON GI  07/12/2016  . RIGHT/LEFT HEART CATH AND CORONARY ANGIOGRAPHY N/A 06/19/2017   Procedure: RIGHT/LEFT HEART CATH AND CORONARY ANGIOGRAPHY;  Surgeon: Jolaine Artist, MD;  Location: [facility];  Service: Cardiovascular;  Laterality: N/A;  . TIBIA IM NAIL INSERTION Right 07/12/2016   Procedure: INTRAMEDULLARY (IM) NAIL RIGHT TIBIA;  Surgeon: Leandrew Koyanagi, MD;  Location: [facility];  Service: Orthopedics;  Laterality: Right;  . UMBILICAL HERNIA REPAIR  ~ 1963   "that's why I don't have a belly button"  . VAGINAL HYSTERECTOMY       Social History   Social History   Socioeconomic History  . Marital status: Widowed    Spouse name: Not on file  . Number of children: 3  . Years of education: Not on file  . Highest education level: Not on file  Occupational History  . 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 Financial resource strain: Not on file  . Food insecurity:    Worry: Not on file    Inability: Not on file  . Transportation needs:    Medical: Not on file    Non-medical: Not on file  Tobacco Use  . Smoking status: Former Smoker    Packs/day: 1.50    Years: 38.00    Pack years: 57.00    Types: Cigarettes    Start date: 03/13/1977    Last attempt to quit: 04/10/2016    Years since quitting: 1.6  . Smokeless tobacco: Never Used  Substance and Sexual Activity  . Alcohol use: No    Alcohol/week: 0.0 standard drinks  . Drug use: No    Types: Cocaine    Comment: 04/10/2016 "last used cocaine back in November 2017"  . Sexual activity: Never    Birth control/protection: Surgical  Lifestyle  . Physical activity:    Days per week: Not on file    Minutes per session: Not on file  . Stress: Not on file  Relationships  . Social connections:    Talks on phone: Not on file    Gets together: Not on file    Attends religious service: Not on file    Active member of club or organization: Not on file    Attends meetings of clubs or organizations: Not on file    Relationship status: Not on file  . Intimate partner violence:    Fear of current or ex partner: Not on file    Emotionally abused: Not on file    Physically abused: Not on file    Forced sexual activity: Not on file  Other Topics Concern  . Not on file  Social History Narrative   Has 1 son, [name]   Lives with son and his boyfriend   Her house has ramps and handrails should she ever needs them.    Her mother lives down the street from her and is a good support person in  addition to her son.   She drives herself, has private transportation.    Cocaine free since 02/24/16, smoke free since 04/10/16  ,  reports that she quit smoking about 19 months ago. Her smoking use included cigarettes. She started smoking about 40 years ago. She has a 57.00 pack-year smoking history. She has never  used smokeless tobacco. She reports that she does not drink alcohol or use drugs.   Family History   Her family history includes Anxiety disorder in her sister; Bipolar disorder in her sister; Cancer in her father and mother; Depression in her brother, mother, sister, and sister; Heart failure in her unknown relative; Schizophrenia in her sister.   Allergies Allergies  Allergen Reactions  . Hydrocodone-Acetaminophen Shortness Of Breath  . Hydroxyzine Anaphylaxis and Shortness Of Breath  . Latuda [Lurasidone Hcl] Anaphylaxis  . Prednisone Anaphylaxis, Swelling and Other (See Comments)    Tongue swelling  . Tramadol Anaphylaxis  . Codeine Nausea And Vomiting  . Sulfa Antibiotics Itching  . Tape Rash     Home Medications  Prior to Admission medications   Medication Sig Start Date End Date Taking? Authorizing Provider  albuterol (PROVENTIL HFA;VENTOLIN HFA) 108 (90 Base) MCG/ACT inhaler Inhale 2 puffs into the lungs every 4 (four) hours as needed for wheezing or shortness of breath. 07/09/17   Mikell, Jeani Sow, MD  albuterol (PROVENTIL) (2.5 MG/3ML) 0.083% nebulizer solution Take 3 mLs (2.5 mg total) by nebulization every 6 (six) hours as needed for wheezing or shortness of breath. Patient taking differently: Take 2.5 mg by nebulization every 4 (four) hours as needed for wheezing or shortness of breath.  06/06/17   Mikell, Jeani Sow, MD  amLODipine (NORVASC) 5 MG tablet Take 5 mg by mouth daily.    [provider]  aspirin (GOODSENSE ASPIRIN LOW DOSE) 81 MG EC tablet TAKE 1 TABLET (81 MG TOTAL) BY MOUTH DAILY (MORNING). 11/12/17   Zenia Resides, MD  atorvastatin (LIPITOR) 80 MG tablet Take 1 tablet (80 mg total) by mouth daily. 08/29/17   Zenia Resides, MD  chlorhexidine (PERIDEX) 0.12 % solution Use as directed 15 mLs in the mouth or throat See admin instructions. Swish 1/2 oz (15 mls) for 30 seconds and then spit - twice daily 04/30/17   [provider]    colchicine (COLCRYS) 0.6 MG tablet TAKE 1 TABLET (0.6 MG TOTAL) BY MOUTH 2 (TWO) TIMES DAILY. 10/17/17   Zenia Resides, MD  diazepam (VALIUM) 5 MG tablet Take 10 mg by mouth 3 (three) times daily.     [provider]  Diclofenac Sodium 3 % GEL Apply 1 application topically daily as needed (pain). Use on area of the pain 06/06/17   Mikell, Jeani Sow, MD  escitalopram (LEXAPRO) 20 MG tablet Take 1 tablet (20 mg total) by mouth daily. Patient taking differently: Take 40 mg by mouth daily.  02/16/17   Arfeen, Arlyce Harman, MD  Febuxostat (ULORIC) 80 MG TABS Take 1 tablet (80 mg total) by mouth daily. 11/12/17   Zenia Resides, MD  ferrous sulfate (FEROSUL) 325 (65 FE) MG tablet TAKE 1 TABLET BY MOUTH DAILY.(MORNING) 11/12/17   Zenia Resides, MD  flunisolide (NASALIDE) 25 MCG/ACT (0.025%) SOLN Place 2 sprays into the nose daily. 06/26/17   McDiarmid, Blane Ohara, MD  Fluticasone-Umeclidin-Vilant (TRELEGY ELLIPTA) 100-62.5-25 MCG/INH AEPB Inhale 1 puff into the lungs daily. 07/26/17   Collene Gobble, MD  gabapentin (NEURONTIN) 600 MG tablet Take 2 tablets (1,200 mg  total) by mouth 3 (three) times daily. 08/29/17   Zenia Resides, MD  guaiFENesin 200 MG/10ML SOLN Take 20 mLs (400 mg total) by mouth 2 times daily at 12 noon and 4 pm. 08/07/17   Byrum, Rose Fillers, MD  KLOR-CON 10 10 MEQ tablet TAKE 6 TABLETS (60 MEQ TOTAL) BY MOUTH 2 (TWO) TIMES DAILY. 11/21/17   Shirley Friar, PA-C  KLOR-CON M10 10 MEQ tablet TAKE 6 TABLETS (60 MEQ TOTAL) BY MOUTH DAILY. 10/03/17   Zenia Resides, MD  lisdexamfetamine (VYVANSE) 40 MG capsule Take 60 mg by mouth daily.     [provider]  loratadine (CLARITIN) 10 MG tablet Take 1 tablet (10 mg total) by mouth daily. 08/20/17   McDiarmid, Blane Ohara, MD  metolazone (ZAROXOLYN) 2.5 MG tablet Take 1 tablet (2.5 mg total) by mouth every Monday, Wednesday, and Friday. Take 30 mins prior to taking Torsemide 05/14/17   Upstill, Nehemiah Settle, PA-C  montelukast  (SINGULAIR) 10 MG tablet Take 1 tablet (10 mg total) by mouth at bedtime. 08/29/17   Zenia Resides, MD  Multiple Vitamin (MULTIVITAMIN WITH MINERALS) TABS tablet Take 1 tablet by mouth daily.    [provider]  omeprazole (PRILOSEC) 40 MG capsule Take 1 capsule (40 mg total) by mouth 2 (two) times daily. 10/17/17   Zenia Resides, MD  oxybutynin (DITROPAN-XL) 10 MG 24 hr tablet TAKE 1 TABLET BY MOUTH EVERYDAY AT BEDTIME 11/12/17   Hensel, Jamal Collin, MD  OXYGEN 8L continuous    [provider]  potassium chloride (K-DUR) 10 MEQ tablet Take 3 tablets (30 mEq total) by mouth 2 (two) times daily. 10/18/17   Zenia Resides, MD  sodium chloride (OCEAN) 0.65 % SOLN nasal spray Place 2 sprays into both nostrils 4 (four) times daily. 06/06/17   Mikell, Jeani Sow, MD  torsemide (DEMADEX) 20 MG tablet Take 4 tablets (80 mg total) by mouth 2 (two) times daily. 06/06/17   [name], MD  traZODone (DESYREL) 100 MG tablet 50 mg in the AM and 150 mg at night 06/25/17   Mikell, Jeani Sow, MD     Critical care time:

## 2017-11-22 NOTE — Procedures (Signed)
Extubation Procedure Note  Patient Details:   Name: Tiffany [name] DOB: 02/08/1960 MRN: [id number]   Airway Documentation:    Vent end date: 11/22/17 Vent end time: 1115   Evaluation  O2 sats: stable throughout Complications: No apparent complications Patient did tolerate procedure well. Bilateral Breath Sounds: Clear, Diminished   Yes   Patient extubated per order to 4L [place] with no apparent complications. MD was notified that no cuff leak was present and RT ordered to proceed with extubation. Patient is alert and oriented and is able to speak. Vitals are stable. RT will continue to monitor.   Armstead Heiland Clyda Greener 11/22/2017, 11:19 AM

## 2017-11-22 NOTE — Progress Notes (Signed)
Critical care progress update  Called by palliative NP. Patient recently started on Saphris for agitation. Apparently when took 10mg  she "passed out". This was titrated down to 5mg  bid. Concern that she may have overtaken this medication. She was also not supposed to drive on this medication, and was found in her car which prompted her admission.  Guadalupe Dawn MD PGY-2 Family Medicine Resident

## 2017-11-22 NOTE — Progress Notes (Signed)
PALLIATIVE CARE CONSULT VISIT   PATIENT NAME: Tiffany [name] DOB: 1960/08/19 MRN: [id number]  PRIMARY CARE PROVIDER:   Zenia Resides, MD  REFERRING PROVIDER:  Zenia Resides, MD [address] [place], [place] [address]  RESPONSIBLE PARTY:  Self       PATIENT NAME: Tiffany [name] DOB: 06-23-1960 MRN: [id number]  REFERRING PROVIDER: Zenia Resides, MD [place], [place] [address]   RESPONSIBLE PARTY:  Acct ID - Guarantor Home Phone Work Phone Relationship Acct Type  [id number] Tiffany, [name]  Self P/F     [address] [place], [place] [address]     RECOMMENDATIONS and PLAN:  1.  Frequent falls:  Related to generalized weakness and new medication.  Strongly encouraged use of walker and motorized chair.  Remain hydrated       2. Agitation  R45.1:  Monitor effects of new med(Saphris).  Encouraged to take only 5mg  bid as recommended by behavioral health.  No driving while taking this med.  Continue FU with Psychiatrist.  Relaxation therapy at home.  3.  Shortness of breath R06.02:  Stable with current therapies and use of O2.   4.  Palliative care encounter Z51.5  Pt's goal is to continue to avoid re-hospitalizations and increase independence with assistance of personal aid. Palliative care will FU in 3 weeks.    I spent 60 minutes providing this consultation,  from 9::00am to 10:00am at home.. More than 50% of the time in this consultation was spent coordinating communication with patient.  Tiffany Bathe, NP was also present during home visit.   HISTORY OF PRESENT ILLNESS:  Follow-up with Tiffany [name] finds that she has experience some increase in agitation related to family occurrences.   A new Rx of Saphris 5mg  BID was started on 11/19/17 per psychiatry. Pt. Reports taking 10mg  at initial dose which caused increased drowsiness and forgetfulness.  She does report improvement of agitation with use of same med. She also fell  this AM while getting OOB.  Denies loss of consciousness or injury.  No reports of shortness of breath and she continues a successful weight loss experience.    CODE STATUS: FULL CODE ADVANCED DIRECTIVES: Y, FULL SCOPE OF TREATMENT  PPS: 50%  HOSPICE ELIGIBILITY/DIAGNOSIS:TBD         PHYSICAL EXAM:   BP 112/70  P 78  R 18  Sats 96%  GEN: obese female, sitting in motorized chair.  In NAD  LUNGS: Even and unlabored respiration.  Clear throughout.  O2 via [place] in use CARDIAC: S1S2, RRR EXTREMITIES: Trace edema RLE SKIN: Hyperpigmentation mid.  Exposed skin is intact  NEURO: A&O x3,  Generalized weakness PSYCH:  Agitated with discussion of recent events.  Calmer mood with redirection.  Jovial prior to end of visit.  Tiffany Lex, NP-C

## 2017-11-22 NOTE — Progress Notes (Signed)
0800 - Multiple bottles of medications including controlled substances found in patient belonging bags at bedside. Medicine counted and verified with [facility] Pharmacist. Medicine sent to pharmacy.   Wallet in patient belongings bag at bedside. 8 one-dollar bills and 2 five-dollar bills found in wallet. Counted with Leanord Hawking RN.   Patient extubated at 1030am. States she is missing $500 from her wallet and multiple "cards" from her wallet.

## 2017-11-22 NOTE — Progress Notes (Signed)
Transfer Note:   Traveling Method: WC Transferring Unit: 84M Mental Orientation: A&O X4 Telemetry: None Ordered Assessment: Completed Skin: WDL IV: WDL Pain: 0 Tubes: IV and O2 on patient.  Safety Measures: Safety Fall Prevention Plan has been given, discussed and signed Admission: Completed 21M Orientation: Patient has been orientated to the room, unit and staff.   Orders have been reviewed and implemented. Will continue to monitor the patient. Call light has been placed within reach and bed alarm has been activated.   Aneta Mins BSN, RN

## 2017-11-22 NOTE — Progress Notes (Signed)
[facility] notified of K+=2.8 and Mag=1.5. Awaiting new orders.

## 2017-11-23 ENCOUNTER — Encounter (HOSPITAL_COMMUNITY): Payer: Self-pay | Admitting: General Practice

## 2017-11-23 LAB — CBC WITH DIFFERENTIAL/PLATELET
Abs Immature Granulocytes: 0.02 10*3/uL (ref 0.00–0.07)
Basophils Absolute: 0 10*3/uL (ref 0.0–0.1)
Basophils Relative: 0 %
Eosinophils Absolute: 0.2 10*3/uL (ref 0.0–0.5)
Eosinophils Relative: 3 %
HCT: 31.2 % — ABNORMAL LOW (ref 36.0–46.0)
Hemoglobin: 8.8 g/dL — ABNORMAL LOW (ref 12.0–15.0)
Immature Granulocytes: 0 %
Lymphocytes Relative: 24 %
Lymphs Abs: 1.5 10*3/uL (ref 0.7–4.0)
MCH: 26.7 pg (ref 26.0–34.0)
MCHC: 28.2 g/dL — ABNORMAL LOW (ref 30.0–36.0)
MCV: 94.8 fL (ref 80.0–100.0)
Monocytes Absolute: 0.3 10*3/uL (ref 0.1–1.0)
Monocytes Relative: 5 %
Neutro Abs: 4.1 10*3/uL (ref 1.7–7.7)
Neutrophils Relative %: 68 %
Platelets: 274 10*3/uL (ref 150–400)
RBC: 3.29 MIL/uL — ABNORMAL LOW (ref 3.87–5.11)
RDW: 14.6 % (ref 11.5–15.5)
WBC: 6.1 10*3/uL (ref 4.0–10.5)
nRBC: 0 % (ref 0.0–0.2)

## 2017-11-23 LAB — COMPREHENSIVE METABOLIC PANEL
ALT: 38 U/L (ref 0–44)
AST: 39 U/L (ref 15–41)
Albumin: 2.2 g/dL — ABNORMAL LOW (ref 3.5–5.0)
Alkaline Phosphatase: 100 U/L (ref 38–126)
Anion gap: 6 (ref 5–15)
BUN: 19 mg/dL (ref 6–20)
CO2: 23 mmol/L (ref 22–32)
Calcium: 7.6 mg/dL — ABNORMAL LOW (ref 8.9–10.3)
Chloride: 115 mmol/L — ABNORMAL HIGH (ref 98–111)
Creatinine, Ser: 1.92 mg/dL — ABNORMAL HIGH (ref 0.44–1.00)
GFR calc Af Amer: 32 mL/min — ABNORMAL LOW (ref >60)
GFR calc non Af Amer: 28 mL/min — ABNORMAL LOW (ref >60)
Glucose, Bld: 79 mg/dL (ref 70–99)
Potassium: 3 mmol/L — ABNORMAL LOW (ref 3.5–5.1)
Sodium: 144 mmol/L (ref 135–145)
Total Bilirubin: 0.4 mg/dL (ref 0.3–1.2)
Total Protein: 4.6 g/dL — ABNORMAL LOW (ref 6.5–8.1)

## 2017-11-23 LAB — MAGNESIUM: Magnesium: 1.3 mg/dL — ABNORMAL LOW (ref 1.7–2.4)

## 2017-11-23 MED ORDER — POTASSIUM CHLORIDE CRYS ER 20 MEQ PO TBCR
40.0000 meq | EXTENDED_RELEASE_TABLET | Freq: Once | ORAL | Status: DC
  Filled 2017-11-23: qty 2

## 2017-11-23 MED ORDER — POTASSIUM CHLORIDE CRYS ER 20 MEQ PO TBCR: 40.0000 meq | EXTENDED_RELEASE_TABLET | Freq: Once | ORAL | Status: DC

## 2017-11-23 MED ORDER — MAGNESIUM OXIDE 400 (241.3 MG) MG PO TABS
400.0000 mg | ORAL_TABLET | Freq: Two times a day (BID) | ORAL | Status: DC
  Administered 2017-11-23: 400 mg via ORAL
  Filled 2017-11-23: qty 1

## 2017-11-23 MED ORDER — POTASSIUM CHLORIDE CRYS ER 10 MEQ PO TBCR
40.0000 meq | EXTENDED_RELEASE_TABLET | Freq: Once | ORAL | Status: AC
  Administered 2017-11-23: 40 meq via ORAL
  Filled 2017-11-23: qty 4

## 2017-11-23 MED ORDER — POTASSIUM CHLORIDE CRYS ER 10 MEQ PO TBCR: 40.0000 meq | EXTENDED_RELEASE_TABLET | Freq: Every day | ORAL | 0 refills | Status: DC

## 2017-11-23 MED ORDER — POTASSIUM CHLORIDE CRYS ER 10 MEQ PO TBCR: 40.0000 meq | EXTENDED_RELEASE_TABLET | Freq: Two times a day (BID) | ORAL | Status: DC

## 2017-11-23 MED ORDER — POTASSIUM CHLORIDE CRYS ER 20 MEQ PO TBCR: 40.0000 meq | EXTENDED_RELEASE_TABLET | Freq: Two times a day (BID) | ORAL | Status: DC

## 2017-11-23 MED ORDER — ACETAMINOPHEN 325 MG PO TABS
650.0000 mg | ORAL_TABLET | Freq: Four times a day (QID) | ORAL | Status: AC | PRN
  Administered 2017-11-23: 650 mg via ORAL
  Filled 2017-11-23: qty 2

## 2017-11-23 MED ORDER — GABAPENTIN 600 MG PO TABS: 600.0000 mg | ORAL_TABLET | Freq: Two times a day (BID) | ORAL | 0 refills | Status: DC

## 2017-11-23 MED ORDER — MAGNESIUM SULFATE 2 GM/50ML IV SOLN
2.0000 g | Freq: Once | INTRAVENOUS | Status: AC
  Administered 2017-11-23: 2 g via INTRAVENOUS
  Filled 2017-11-23: qty 50

## 2017-11-23 MED ORDER — WHITE PETROLATUM EX OINT
TOPICAL_OINTMENT | CUTANEOUS | Status: AC
  Administered 2017-11-23: 0.2
  Filled 2017-11-23: qty 28.35

## 2017-11-23 NOTE — Consult Note (Signed)
   [facility] CM Inpatient Consult   11/23/2017  Tiffany [name] January 31, 1960 [id number]  Patient screened for high risk for unplanned readmission.  Chart review and noted patient is active with [facility] for home based Palliative Care services.  Patient will have post hospital care management needs met in that program.   Please place a [facility] Care Management consult if this changes or for questions contact:   Natividad Brood, RN BSN [facility] Liaison  [phone] business mobile phone Toll free office [phone]

## 2017-11-23 NOTE — Discharge Summary (Signed)
[facility] Discharge Summary  Patient name: Tiffany [name] Medical record number: [id number] Date of birth: 1960/12/28 Age: 57 y.o. Gender: female Date of Admission: 11/21/2017  Date of Discharge: 11/23/2017 Admitting Physician: Etheleen Nicks, MD  Primary Care Provider: Zenia Resides, MD Consultants: ICU, PT  Indication for Hospitalization: unresponsiveness 2/2 medication  Discharge Diagnoses/Problem List:  AMS Respiratory failure COPD Anemia arthritis H/o CVA H/o MI H/o cocaine abuse Bipolar Carotid stenosis Diastolic HF CKD III Gout HTN OSA Obesity   Disposition: discharge to home  Discharge Condition: resolved, stable  Discharge Exam:  General: patient is not in acute distress- well-appearing and anxious Heart: RRR, no murmur appreciated Pulm: CTAB, no increased WOB Abd: soft, obese, non-tender to palpation Extremities: no rashes or lesions, no LE edema Neuro: alert and oriented, no focal deficits Psych: cooperative, slightly pressured speech/tangential   Brief Hospital Course:  Patient admitted 11/6 for unresponsiveness likely 2/2 medication side effect. Received 1 dose narcan with minimal response. Intubated 11/6- 11/7. UDS positive for benzodiazepines, amphetamines. Patient rapidly returned to baseline mental status upon extubation. On day of discharge, her mental status was at baseline, vital signs stable with no oxygen requirement and clear breath sounds. AKI with Cr as high as 4.14 which improved with IV fluids to 1.92 on day of discharge.  Hypokalemia and hypomagnesia which were both replenished prior to discharge and will need follow up.  Issues for Follow Up:  1. Will need follow up for pulmonary function tests (history of oxygen requirement and nightly Bipap. On day of discharge ORA: 96% at rest, 91% ambulating)- Pt has pulmonary appointment 11/13. 2. Will need follow up for psychotic medications (newly started on  saphris by psychiatrist- thought to be cause of passing out- discontinued this admission) 3. Needs follow up of magnesium and K+ levels (required replenishment inpatient). On discharge- discontinued metolazone 2/2 AKI, continued torsemide and prescribed 40mEq K+ daily.  Significant Procedures: intubated 11/6-11/7  Significant Labs and Imaging:  Recent Labs  Lab 11/21/17 1947 11/22/17 0253 11/23/17 0534  WBC 4.9 6.1 6.1  HGB 9.5* 9.0* 8.8*  HCT 31.1* 29.4* 31.2*  PLT 292 253 274   Recent Labs  Lab 11/21/17 1947 11/21/17 2136 11/22/17 0253 11/23/17 0534  NA 141  --  142 144  K 2.8*  --  2.8* 3.0*  CL 103  --  108 115*  CO2 24  --  25 23  GLUCOSE 79  --  76 79  BUN 33*  --  30* 19  CREATININE 4.14*  --  3.49* 1.92*  CALCIUM 8.7*  --  8.2* 7.6*  MG  --  1.6* 1.5* 1.3*  ALKPHOS 115  --  104 100  AST 52*  --  48* 39  ALT 47*  --  44 38  ALBUMIN 3.1*  --  2.7* 2.2*    Magnesium 1.6>1.5>1.3  Results/Tests Pending at Time of Discharge: none  Discharge Medications:  Allergies as of 11/23/2017      Reactions   Hydrocodone-acetaminophen Shortness Of Breath   Hydroxyzine Anaphylaxis, Shortness Of Breath   Latuda [lurasidone Hcl] Anaphylaxis   Prednisone Anaphylaxis, Swelling, Other (See Comments)   Tongue swelling   Tramadol Anaphylaxis   Codeine Nausea And Vomiting   Sulfa Antibiotics Itching   Tape Rash      Medication List    STOP taking these medications   KLOR-CON 10 10 MEQ tablet Generic drug:  potassium chloride   metolazone 2.5 MG tablet Commonly  known as:  ZAROXOLYN   OXYGEN   potassium chloride 10 MEQ tablet Commonly known as:  K-DUR     TAKE these medications   albuterol (2.5 MG/3ML) 0.083% nebulizer solution Commonly known as:  PROVENTIL Take 3 mLs (2.5 mg total) by nebulization every 6 (six) hours as needed for wheezing or shortness of breath. What changed:  when to take this   albuterol 108 (90 Base) MCG/ACT inhaler Commonly known as:   PROVENTIL HFA;VENTOLIN HFA Inhale 2 puffs into the lungs every 4 (four) hours as needed for wheezing or shortness of breath. What changed:  Another medication with the same name was changed. Make sure you understand how and when to take each.   amLODipine 5 MG tablet Commonly known as:  NORVASC Take 5 mg by mouth daily.   aspirin 81 MG EC tablet TAKE 1 TABLET (81 MG TOTAL) BY MOUTH DAILY (MORNING).   atorvastatin 80 MG tablet Commonly known as:  LIPITOR Take 1 tablet (80 mg total) by mouth daily.   chlorhexidine 0.12 % solution Commonly known as:  PERIDEX Use as directed 15 mLs in the mouth or throat See admin instructions. Swish 1/2 oz (15 mls) for 30 seconds and then spit - twice daily   colchicine 0.6 MG tablet TAKE 1 TABLET (0.6 MG TOTAL) BY MOUTH 2 (TWO) TIMES DAILY.   diazepam 5 MG tablet Commonly known as:  VALIUM Take 10 mg by mouth 3 (three) times daily.   Diclofenac Sodium 3 % Gel Apply 1 application topically daily as needed (pain). Use on area of the pain   escitalopram 20 MG tablet Commonly known as:  LEXAPRO Take 1 tablet (20 mg total) by mouth daily. What changed:  how much to take   Febuxostat 80 MG Tabs Take 1 tablet (80 mg total) by mouth daily.   ferrous sulfate 325 (65 FE) MG tablet TAKE 1 TABLET BY MOUTH DAILY.(MORNING)   flunisolide 25 MCG/ACT (0.025%) Soln Commonly known as:  NASALIDE Place 2 sprays into the nose daily.   Fluticasone-Umeclidin-Vilant 100-62.5-25 MCG/INH Aepb Inhale 1 puff into the lungs daily.   gabapentin 600 MG tablet Commonly known as:  NEURONTIN Take 1 tablet (600 mg total) by mouth 2 (two) times daily. What changed:    how much to take  when to take this   guaiFENesin 200 MG/10ML Soln Take 20 mLs (400 mg total) by mouth 2 times daily at 12 noon and 4 pm.   lisdexamfetamine 70 MG capsule Commonly known as:  VYVANSE Take 70 mg by mouth daily.   loratadine 10 MG tablet Commonly known as:  CLARITIN Take 1 tablet  (10 mg total) by mouth daily.   montelukast 10 MG tablet Commonly known as:  SINGULAIR Take 1 tablet (10 mg total) by mouth at bedtime.   multivitamin with minerals Tabs tablet Take 1 tablet by mouth daily.   omeprazole 40 MG capsule Commonly known as:  PRILOSEC Take 1 capsule (40 mg total) by mouth 2 (two) times daily.   oxybutynin 10 MG 24 hr tablet Commonly known as:  DITROPAN-XL TAKE 1 TABLET BY MOUTH EVERYDAY AT BEDTIME   potassium chloride 10 MEQ tablet Commonly known as:  K-DUR,KLOR-CON Take 4 tablets (40 mEq total) by mouth daily. What changed:  See the new instructions.   sodium chloride 0.65 % Soln nasal spray Commonly known as:  OCEAN Place 2 sprays into both nostrils 4 (four) times daily.   torsemide 20 MG tablet Commonly known as:  DEMADEX  Take 4 tablets (80 mg total) by mouth 2 (two) times daily.   traZODone 100 MG tablet Commonly known as:  DESYREL 50 mg in the AM and 150 mg at night What changed:    how much to take  how to take this  when to take this  additional instructions       Discharge Instructions: Please refer to Patient Instructions section of EMR for full details.  Patient was counseled important signs and symptoms that should prompt return to medical care, changes in medications, dietary instructions, activity restrictions, and follow up appointments.   Follow-Up Appointments: 11/12 PCP 11/13 pulm  Richarda Osmond, DO 11/23/2017, 2:36 PM PGY-1, [place]

## 2017-11-23 NOTE — Progress Notes (Signed)
SATURATION QUALIFICATIONS:  Patient Saturations on Room Air at Rest = 96%  Patient Saturations on Room Air while Ambulating = 91%

## 2017-11-23 NOTE — Care Management Important Message (Signed)
Important Message  Patient Details  Name: Tiffany [name] MRN: [id number] Date of Birth: [birth year]   Medicare Important Message Given:  Yes    Prentiss Polio Montine Circle 11/23/2017, 3:41 PM

## 2017-11-23 NOTE — Evaluation (Signed)
Occupational Therapy Evaluation Patient Details Name: Tiffany [name] MRN: [id number] DOB: 1960-07-02 Today's Date: 11/23/2017    History of Present Illness 57 y.o female found unresponsive in parking lot, intubated 11/06, extubated 11/07. PMH includes: CVA, MI, Cocaine abuse, COPD, anemia, arthritis, bipolar, cardiac arrest, carotid stenosis, diastolic HF, CKD III, gout, HTN, OSA, morbid obesity.    Clinical Impression   Pt admitted with the above diagnoses and presents with below problem list. Pt will benefit from continued acute OT to address the below listed deficits and maximize independence with basic ADLs prior to d/c to venue below. PTA pt reports needing assist with basic ADLs, ambulates within her home with cane or walker. Pt endorses h/o falls. Pt is currently min guard to min A with LB ADLs, min to mod A with UB ADLs and grooming tasks.      Follow Up Recommendations  Home health OT;Supervision/Assistance - 24 hour    Equipment Recommendations  None recommended by OT    Recommendations for Other Services       Precautions / Restrictions Precautions Precautions: Fall Restrictions Weight Bearing Restrictions: No      Mobility Bed Mobility Overal bed mobility: Needs Assistance Bed Mobility: Supine to Sit     Supine to sit: Supervision;HOB elevated     General bed mobility comments: up in chair  Transfers Overall transfer level: Needs assistance Equipment used: Rolling walker (2 wheeled) Transfers: Sit to/from Stand Sit to Stand: Supervision Stand pivot transfers: Min guard       General transfer comment: to/from recliner    Balance Overall balance assessment: Needs assistance Sitting-balance support: No upper extremity supported;Feet supported Sitting balance-Leahy Scale: Good     Standing balance support: Single extremity supported;During functional activity Standing balance-Leahy Scale: Fair Standing balance comment: able to stand statically  without support and ambulate with unilateral support                           ADL either performed or assessed with clinical judgement   ADL Overall ADL's : Needs assistance/impaired Eating/Feeding: Set up;Sitting   Grooming: Moderate assistance;Sitting   Upper Body Bathing: Minimal assistance;Sitting   Lower Body Bathing: Sit to/from stand;Moderate assistance   Upper Body Dressing : Minimal assistance;Sitting   Lower Body Dressing: Minimal assistance;Sit to/from stand   Toilet Transfer: Min guard;Ambulation;RW   Toileting- Clothing Manipulation and Hygiene: Minimal assistance;Min guard;Sit to/from Nurse, children's Details (indicate cue type and reason): sponge bathes at baseline Functional mobility during ADLs: Min guard;Rolling walker General ADL Comments: Pt completed in-room functional mobility. Discussed fall prevention.     Vision         Perception     Praxis      Pertinent Vitals/Pain Pain Assessment: Faces Faces Pain Scale: Hurts little more Pain Location: generalized during ambulation "goit" Pain Intervention(s): Monitored during session;Limited activity within patient's tolerance     Hand Dominance     Extremity/Trunk Assessment Upper Extremity Assessment Upper Extremity Assessment: Generalized weakness   Lower Extremity Assessment Lower Extremity Assessment: Defer to PT evaluation       Communication Communication Communication: No difficulties   Cognition Arousal/Alertness: Awake/alert Behavior During Therapy: Anxious;Agitated Overall Cognitive Status: No family/caregiver present to determine baseline cognitive functioning                                 General Comments:  Mildly impulsive. Difficulty staying on task. Decreased safety awareness   General Comments       Exercises     Shoulder Instructions      Home Living Family/patient expects to be discharged to:: Private residence Living  Arrangements: Other (Comment) Available Help at Discharge: Family;Available 24 hours/day Type of Home: House Home Access: Ramped entrance     Home Layout: One level     Bathroom Shower/Tub: Teacher, early years/pre: Standard Bathroom Accessibility: Yes   Home Equipment: Grab bars - tub/shower;Cane - single point;Walker - 2 wheels;Bedside commode;Walker - 4 wheels;Wheelchair - Scientist, physiological: Reacher;Sock aid        Prior Functioning/Environment Level of Independence: Independent with assistive device(s);Needs assistance  Gait / Transfers Assistance Needed: Patient reports she uses cane or walker for ambulation.  Sometimes uses motorized scooter ADL's / Homemaking Assistance Needed: Reports she has assist of an aide for 3.5 hours. assisted for LB bathing and dressing, some light meal prep, son and friend do housekeeping and heavy meal prep, pt sometimes needs assist for pericare   Comments: pt has AE, but reports she is not interested in using        OT Problem List: Decreased strength;Decreased activity tolerance;Impaired balance (sitting and/or standing);Decreased cognition;Decreased safety awareness;Decreased knowledge of use of DME or AE;Decreased knowledge of precautions;Pain      OT Treatment/Interventions: Self-care/ADL training;Therapeutic exercise;DME and/or AE instruction;Therapeutic activities;Patient/family education;Balance training    OT Goals(Current goals can be found in the care plan section) Acute Rehab OT Goals Patient Stated Goal: home OT Goal Formulation: With patient Time For Goal Achievement: 11/30/17 Potential to Achieve Goals: Good ADL Goals Pt Will Perform Upper Body Bathing: with set-up;sitting;with supervision Pt Will Perform Lower Body Bathing: sit to/from stand;with min guard assist Pt Will Perform Upper Body Dressing: with set-up;with supervision;sitting Pt Will Perform Lower Body Dressing: with min guard  assist;with min assist;sit to/from stand Pt Will Transfer to Toilet: with modified independence;ambulating Pt Will Perform Toileting - Clothing Manipulation and hygiene: with modified independence;sit to/from stand  OT Frequency: Min 2X/week   Barriers to D/C:            Co-evaluation              AM-PAC PT "6 Clicks" Daily Activity     Outcome Measure Help from another person eating meals?: None Help from another person taking care of personal grooming?: A Lot Help from another person toileting, which includes using toliet, bedpan, or urinal?: A Lot Help from another person bathing (including washing, rinsing, drying)?: A Lot Help from another person to put on and taking off regular upper body clothing?: A Little Help from another person to put on and taking off regular lower body clothing?: A Lot 6 Click Score: 15   End of Session Equipment Utilized During Treatment: Surveyor, mining Communication: Other (comment)(assist with setting chair alarm)  Activity Tolerance: Patient tolerated treatment well Patient left: in chair;with call bell/phone within reach;with nursing/sitter in room  OT Visit Diagnosis: Unsteadiness on feet (R26.81);Muscle weakness (generalized) (M62.81);History of falling (Z91.81);Pain;Other symptoms and signs involving cognitive function                Time: 1341-1401 OT Time Calculation (min): 20 min Charges:  OT General Charges $OT Visit: 1 Visit OT Evaluation $OT Eval Low Complexity: [place], OT Acute Rehabilitation Services Pager: [phone] Office: [phone]   Hortencia Pilar 11/23/2017, 2:11 PM

## 2017-11-23 NOTE — Progress Notes (Signed)
Physical Therapy Treatment Patient Details Name: Tiffany [name] MRN: [id number] DOB: 05-Jan-1961 Today's Date: 11/23/2017    History of Present Illness 57 y.o female found unresponsive in parking lot, intubated 11/06, extubated 11/07. PMH includes: CVA, MI, Cocaine abuse, COPD, anemia, arthritis, bipolar, cardiac arrest, carotid stenosis, diastolic HF, CKD III, gout, HTN, OSA, morbid obesity.     PT Comments    Pt progressing well with mobility. She performed transfers min guard assist and ambulated in room 20 feet min guard assist. Pt is easily distracted and difficult to keep on task. Pt continuously wanting to make phone calls during session and distracted by the possibility of going home today. Recommending RW for longer gait distances. PT to continue per POC.   Follow Up Recommendations  Home health PT;Supervision/Assistance - 24 hour     Equipment Recommendations  Rolling walker with 5" wheels    Recommendations for Other Services       Precautions / Restrictions Precautions Precautions: Fall    Mobility  Bed Mobility Overal bed mobility: Needs Assistance Bed Mobility: Supine to Sit     Supine to sit: Supervision;HOB elevated     General bed mobility comments: +rail  Transfers Overall transfer level: Needs assistance Equipment used: None Transfers: Sit to/from [employer] Transfers Sit to Stand: Min guard Stand pivot transfers: Min guard       General transfer comment: min guard for safety  Ambulation/Gait Ambulation/Gait assistance: Min guard Gait Distance (Feet): 20 Feet Assistive device: (furniture walking in room) Gait Pattern/deviations: Step-through pattern;Decreased stride length Gait velocity: decreased Gait velocity interpretation: <1.31 ft/sec, indicative of household ambulator General Gait Details: mildly unsteady. Per eval, pt uses a cane as baseline. Pt also reports having a motorized wheelchair.   Stairs              Wheelchair Mobility    Modified Rankin (Stroke Patients Only)       Balance Overall balance assessment: Needs assistance Sitting-balance support: No upper extremity supported;Feet supported Sitting balance-Leahy Scale: Good     Standing balance support: Single extremity supported;During functional activity Standing balance-Leahy Scale: Fair Standing balance comment: able to stand statically without support and ambulate with unilateral support                            Cognition Arousal/Alertness: Awake/alert Behavior During Therapy: WFL for tasks assessed/performed;Impulsive Overall Cognitive Status: No family/caregiver present to determine baseline cognitive functioning                                 General Comments: Mildly impulsive. Difficulty staying on task. Decreased safety awareness      Exercises      General Comments        Pertinent Vitals/Pain Pain Assessment: No/denies pain    Home Living                      Prior Function            PT Goals (current goals can now be found in the care plan section) Acute Rehab PT Goals Patient Stated Goal: home PT Goal Formulation: With patient Potential to Achieve Goals: Good Progress towards PT goals: Progressing toward goals    Frequency           PT Plan Current plan remains appropriate    Co-evaluation  AM-PAC PT "6 Clicks" Daily Activity  Outcome Measure  Difficulty turning over in bed (including adjusting bedclothes, sheets and blankets)?: None Difficulty moving from lying on back to sitting on the side of the bed? : A Little Difficulty sitting down on and standing up from a chair with arms (e.g., wheelchair, bedside commode, etc,.)?: A Little Help needed moving to and from a bed to chair (including a wheelchair)?: A Little Help needed walking in hospital room?: A Little Help needed climbing 3-5 steps with a railing? : A Lot 6 Click  Score: 18    End of Session Equipment Utilized During Treatment: Gait belt Activity Tolerance: Patient tolerated treatment well Patient left: in chair;with call bell/phone within reach;with chair alarm set Nurse Communication: Mobility status PT Visit Diagnosis: Unsteadiness on feet (R26.81);Difficulty in walking, not elsewhere classified (R26.2)     Time: 7076-1518 PT Time Calculation (min) (ACUTE ONLY): 24 min  Charges:  $Gait Training: 8-22 mins $Therapeutic Activity: 8-22 mins                     Lorrin Goodell, PT  Office # [phone] Pager [phone]    Lorriane Shire 11/23/2017, 11:23 AM

## 2017-11-23 NOTE — Progress Notes (Signed)
Patient discharged to home. After visit summary reviewed and patient capable of re verbalizing medications and follow up appointments. Pt remains stable. All medications returned from the pharmacy. No signs and symptoms of distress. Educated to return to ER in the event of SOB, dizziness, chest pain, or fainting. Mady Gemma, RN

## 2017-11-23 NOTE — Clinical Social Work Note (Signed)
[name] acknowledges consult for possible homelessness and medication overdose prior to admission. Per MD note, patient lives home with her son and his significant other. PT evaluation also notes she is not homeless. Please consult psych if patient needs to be evaluated regarding medication overdose.  [name] signing off. Consult again if any other social work needs arise.  Dayton Scrape, [place]

## 2017-11-24 DIAGNOSIS — J189 Pneumonia, unspecified organism: Secondary | ICD-10-CM | POA: Diagnosis not present

## 2017-11-24 DIAGNOSIS — G4733 Obstructive sleep apnea (adult) (pediatric): Secondary | ICD-10-CM | POA: Diagnosis not present

## 2017-11-24 DIAGNOSIS — R0602 Shortness of breath: Secondary | ICD-10-CM | POA: Diagnosis not present

## 2017-11-24 DIAGNOSIS — J440 Chronic obstructive pulmonary disease with acute lower respiratory infection: Secondary | ICD-10-CM | POA: Diagnosis not present

## 2017-11-24 DIAGNOSIS — J45909 Unspecified asthma, uncomplicated: Secondary | ICD-10-CM | POA: Diagnosis not present

## 2017-11-24 DIAGNOSIS — R062 Wheezing: Secondary | ICD-10-CM | POA: Diagnosis not present

## 2017-11-26 DIAGNOSIS — F411 Generalized anxiety disorder: Secondary | ICD-10-CM | POA: Diagnosis not present

## 2017-11-26 DIAGNOSIS — F330 Major depressive disorder, recurrent, mild: Secondary | ICD-10-CM | POA: Diagnosis not present

## 2017-11-26 LAB — CULTURE, BLOOD (ROUTINE X 2)
Culture: NO GROWTH
Culture: NO GROWTH
Special Requests: ADEQUATE

## 2017-11-27 ENCOUNTER — Encounter: Payer: Self-pay | Admitting: Family Medicine

## 2017-11-27 ENCOUNTER — Other Ambulatory Visit: Payer: Self-pay

## 2017-11-27 ENCOUNTER — Ambulatory Visit (INDEPENDENT_AMBULATORY_CARE_PROVIDER_SITE_OTHER): Payer: Medicare HMO | Admitting: Family Medicine

## 2017-11-27 DIAGNOSIS — I10 Essential (primary) hypertension: Secondary | ICD-10-CM | POA: Diagnosis not present

## 2017-11-27 DIAGNOSIS — J9611 Chronic respiratory failure with hypoxia: Secondary | ICD-10-CM

## 2017-11-27 DIAGNOSIS — J9612 Chronic respiratory failure with hypercapnia: Secondary | ICD-10-CM | POA: Diagnosis not present

## 2017-11-27 DIAGNOSIS — F3164 Bipolar disorder, current episode mixed, severe, with psychotic features: Secondary | ICD-10-CM | POA: Diagnosis not present

## 2017-11-27 DIAGNOSIS — I5032 Chronic diastolic (congestive) heart failure: Secondary | ICD-10-CM | POA: Diagnosis not present

## 2017-11-27 DIAGNOSIS — I110 Hypertensive heart disease with heart failure: Secondary | ICD-10-CM | POA: Diagnosis not present

## 2017-11-27 DIAGNOSIS — N179 Acute kidney failure, unspecified: Secondary | ICD-10-CM | POA: Diagnosis not present

## 2017-11-27 NOTE — Patient Instructions (Addendum)
I will see you in two weeks.  I want to make sure that you get back to losing weight and feeling good like you did like my last visit.   Please double check your medications against my med list to make sure we agree.

## 2017-11-28 ENCOUNTER — Ambulatory Visit: Payer: Self-pay | Admitting: Emergency Medicine

## 2017-11-28 ENCOUNTER — Encounter: Payer: Self-pay | Admitting: Family Medicine

## 2017-11-28 ENCOUNTER — Ambulatory Visit (INDEPENDENT_AMBULATORY_CARE_PROVIDER_SITE_OTHER): Payer: Medicare HMO | Admitting: Primary Care

## 2017-11-28 ENCOUNTER — Encounter: Payer: Self-pay | Admitting: Primary Care

## 2017-11-28 VITALS — BP 124/76 | HR 87 | Temp 98.4°F | Ht 62.0 in | Wt 214.0 lb

## 2017-11-28 DIAGNOSIS — M1990 Unspecified osteoarthritis, unspecified site: Secondary | ICD-10-CM | POA: Diagnosis not present

## 2017-11-28 DIAGNOSIS — Z9981 Dependence on supplemental oxygen: Secondary | ICD-10-CM

## 2017-11-28 DIAGNOSIS — J9611 Chronic respiratory failure with hypoxia: Secondary | ICD-10-CM

## 2017-11-28 DIAGNOSIS — R296 Repeated falls: Secondary | ICD-10-CM | POA: Diagnosis not present

## 2017-11-28 DIAGNOSIS — G4733 Obstructive sleep apnea (adult) (pediatric): Secondary | ICD-10-CM | POA: Diagnosis not present

## 2017-11-28 DIAGNOSIS — I5032 Chronic diastolic (congestive) heart failure: Secondary | ICD-10-CM

## 2017-11-28 LAB — BASIC METABOLIC PANEL
BUN/Creatinine Ratio: 8 — ABNORMAL LOW (ref 9–23)
BUN: 12 mg/dL (ref 6–24)
CO2: 25 mmol/L (ref 20–29)
Calcium: 8.8 mg/dL (ref 8.7–10.2)
Chloride: 104 mmol/L (ref 96–106)
Creatinine, Ser: 1.54 mg/dL — ABNORMAL HIGH (ref 0.57–1.00)
GFR calc Af Amer: 43 mL/min/{1.73_m2} — ABNORMAL LOW (ref >59)
GFR calc non Af Amer: 37 mL/min/{1.73_m2} — ABNORMAL LOW (ref >59)
Glucose: 70 mg/dL (ref 65–99)
Potassium: 3.5 mmol/L (ref 3.5–5.2)
Sodium: 146 mmol/L — ABNORMAL HIGH (ref 134–144)

## 2017-11-28 NOTE — Progress Notes (Signed)
   Subjective:    Patient ID: Tiffany [name], female    DOB: 11-28-1960, 57 y.o.   MRN: [id number]  HPI FU hospitalization.  Patient hospitalized after being found unresponsive.  The suspected diagnosis was a medication reaction.  A final diagnosis was never confirmed.  [name] on a LOT of medications - less since hospitalization.  Adamently denies ongoing drug abuse.    She feels fine today.  [name] emotionally upset because "my aide stool from me."  [name] also upset at [facility] because "They cut my clothes off."  Carries the diagnosis of bipolar with psychotic feetures.  Followed by psych.  Had AKI in hospital with the suspicion that she was over diuresed.  Zaroxalyn stopped.  Still on torsemide for CHF.  Denies leg swelling, CP or SOB.   Review of Systems     Objective:   Physical Exam VS noted, wt up a bit Lungs clear Cardiac RRR without m or g Ext 1+ edema. Mood upset mildly and talking loud but appropriate.        Assessment & Plan:  See problem based charting.  General note: [name] a perfect example of the difficult to care for patient with important medical problems and important psychiatric problems.

## 2017-11-28 NOTE — Assessment & Plan Note (Signed)
Seems stable off zaroxalyn

## 2017-11-28 NOTE — Assessment & Plan Note (Signed)
Well controled. 

## 2017-11-28 NOTE — Patient Instructions (Addendum)
Needs PFTs in 1-2 months with office visit after with Dr. Lamonte Sakai please   Referral [facility], physical therapy   You were 100% on 3-4L at rest, please try using less oxygen when sitting   Labs today, checking magnesium and potassium

## 2017-11-28 NOTE — Assessment & Plan Note (Signed)
AKI is resolved based on BMP.  Continue meds as listed.
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
## 2017-11-28 NOTE — Assessment & Plan Note (Signed)
OK on her typical home O2 of 6l per minute by nasal cannula.  I hope her impressive wt loss will dicrease her O2 requirement.

## 2017-11-29 DIAGNOSIS — I5032 Chronic diastolic (congestive) heart failure: Secondary | ICD-10-CM | POA: Diagnosis not present

## 2017-11-29 DIAGNOSIS — J9611 Chronic respiratory failure with hypoxia: Secondary | ICD-10-CM | POA: Diagnosis not present

## 2017-11-29 DIAGNOSIS — M1990 Unspecified osteoarthritis, unspecified site: Secondary | ICD-10-CM | POA: Diagnosis not present

## 2017-11-30 DIAGNOSIS — J9611 Chronic respiratory failure with hypoxia: Secondary | ICD-10-CM | POA: Diagnosis not present

## 2017-11-30 DIAGNOSIS — I5032 Chronic diastolic (congestive) heart failure: Secondary | ICD-10-CM | POA: Diagnosis not present

## 2017-11-30 DIAGNOSIS — M1990 Unspecified osteoarthritis, unspecified site: Secondary | ICD-10-CM | POA: Diagnosis not present

## 2017-12-01 DIAGNOSIS — M1990 Unspecified osteoarthritis, unspecified site: Secondary | ICD-10-CM | POA: Diagnosis not present

## 2017-12-01 DIAGNOSIS — I5032 Chronic diastolic (congestive) heart failure: Secondary | ICD-10-CM | POA: Diagnosis not present

## 2017-12-01 DIAGNOSIS — J9611 Chronic respiratory failure with hypoxia: Secondary | ICD-10-CM | POA: Diagnosis not present

## 2017-12-02 ENCOUNTER — Emergency Department (HOSPITAL_COMMUNITY): Payer: Medicare HMO

## 2017-12-02 ENCOUNTER — Emergency Department (HOSPITAL_COMMUNITY)
Admission: EM | Admit: 2017-12-02 | Discharge: 2017-12-02 | Disposition: A | Discharge status: Home / Self Care | Payer: Medicare HMO | Attending: Emergency Medicine | Admitting: Emergency Medicine

## 2017-12-02 ENCOUNTER — Other Ambulatory Visit: Payer: Self-pay

## 2017-12-02 DIAGNOSIS — M79661 Pain in right lower leg: Secondary | ICD-10-CM | POA: Diagnosis not present

## 2017-12-02 DIAGNOSIS — R0789 Other chest pain: Secondary | ICD-10-CM | POA: Diagnosis not present

## 2017-12-02 DIAGNOSIS — Y999 Unspecified external cause status: Secondary | ICD-10-CM | POA: Diagnosis not present

## 2017-12-02 DIAGNOSIS — Y9389 Activity, other specified: Secondary | ICD-10-CM | POA: Diagnosis not present

## 2017-12-02 DIAGNOSIS — R52 Pain, unspecified: Secondary | ICD-10-CM | POA: Diagnosis not present

## 2017-12-02 DIAGNOSIS — E1122 Type 2 diabetes mellitus with diabetic chronic kidney disease: Secondary | ICD-10-CM | POA: Insufficient documentation

## 2017-12-02 DIAGNOSIS — Z87891 Personal history of nicotine dependence: Secondary | ICD-10-CM | POA: Diagnosis not present

## 2017-12-02 DIAGNOSIS — J449 Chronic obstructive pulmonary disease, unspecified: Secondary | ICD-10-CM | POA: Diagnosis not present

## 2017-12-02 DIAGNOSIS — R0781 Pleurodynia: Secondary | ICD-10-CM | POA: Diagnosis not present

## 2017-12-02 DIAGNOSIS — I5032 Chronic diastolic (congestive) heart failure: Secondary | ICD-10-CM | POA: Insufficient documentation

## 2017-12-02 DIAGNOSIS — J9611 Chronic respiratory failure with hypoxia: Secondary | ICD-10-CM | POA: Diagnosis not present

## 2017-12-02 DIAGNOSIS — I130 Hypertensive heart and chronic kidney disease with heart failure and stage 1 through stage 4 chronic kidney disease, or unspecified chronic kidney disease: Secondary | ICD-10-CM | POA: Insufficient documentation

## 2017-12-02 DIAGNOSIS — S20212D Contusion of left front wall of thorax, subsequent encounter: Secondary | ICD-10-CM | POA: Insufficient documentation

## 2017-12-02 DIAGNOSIS — Z79899 Other long term (current) drug therapy: Secondary | ICD-10-CM | POA: Insufficient documentation

## 2017-12-02 DIAGNOSIS — Y92009 Unspecified place in unspecified non-institutional (private) residence as the place of occurrence of the external cause: Secondary | ICD-10-CM | POA: Insufficient documentation

## 2017-12-02 DIAGNOSIS — S20212A Contusion of left front wall of thorax, initial encounter: Secondary | ICD-10-CM | POA: Diagnosis not present

## 2017-12-02 DIAGNOSIS — Z7982 Long term (current) use of aspirin: Secondary | ICD-10-CM | POA: Diagnosis not present

## 2017-12-02 DIAGNOSIS — N183 Chronic kidney disease, stage 3 (moderate): Secondary | ICD-10-CM | POA: Insufficient documentation

## 2017-12-02 DIAGNOSIS — M1990 Unspecified osteoarthritis, unspecified site: Secondary | ICD-10-CM | POA: Diagnosis not present

## 2017-12-02 MED ORDER — LIDOCAINE 5 % EX PTCH: 1.0000 | MEDICATED_PATCH | CUTANEOUS | 0 refills | Status: DC

## 2017-12-02 MED ORDER — LIDOCAINE 5 % EX PTCH
1.0000 | MEDICATED_PATCH | CUTANEOUS | Status: DC
  Administered 2017-12-02: 1 via TRANSDERMAL
  Filled 2017-12-02: qty 1
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
## 2017-12-02 NOTE — ED Notes (Signed)
Pt returned from XR to room. Pt talking on phone with family.
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
## 2017-12-03 ENCOUNTER — Encounter: Payer: Self-pay | Admitting: Primary Care

## 2017-12-03 DIAGNOSIS — M1990 Unspecified osteoarthritis, unspecified site: Secondary | ICD-10-CM | POA: Diagnosis not present

## 2017-12-03 DIAGNOSIS — J9611 Chronic respiratory failure with hypoxia: Secondary | ICD-10-CM | POA: Diagnosis not present

## 2017-12-03 DIAGNOSIS — I5032 Chronic diastolic (congestive) heart failure: Secondary | ICD-10-CM | POA: Diagnosis not present

## 2017-12-03 NOTE — Assessment & Plan Note (Addendum)
Continue TRELEGY 1 puff daily Needs PFTs prior to next office visit  FU in 1-2 months with Dr. Lamonte Sakai

## 2017-12-03 NOTE — Assessment & Plan Note (Signed)
Needs to restart physical therapy, plan 6 weeks

## 2017-12-03 NOTE — Assessment & Plan Note (Signed)
Discharge with no oxygen requirements, patient on 6-8L oxygen during today's office visit sating 100%. Encouraged patient try using less oxygen when at rest   Oxygen saturation check: 8L 100%- no shortness of breath  6L 100% - no shortness of breath  4L 100%- felt short of breath  3L 100%- felt short of breath

## 2017-12-03 NOTE — Assessment & Plan Note (Signed)
Patient is compliant with Trilogy ventilator at night

## 2017-12-04 ENCOUNTER — Ambulatory Visit (INDEPENDENT_AMBULATORY_CARE_PROVIDER_SITE_OTHER): Payer: Medicare HMO | Admitting: Licensed Clinical Social Worker

## 2017-12-04 DIAGNOSIS — F4323 Adjustment disorder with mixed anxiety and depressed mood: Secondary | ICD-10-CM | POA: Diagnosis not present

## 2017-12-06 ENCOUNTER — Encounter: Payer: Self-pay | Admitting: Family Medicine

## 2017-12-06 ENCOUNTER — Telehealth: Payer: Self-pay | Admitting: Family Medicine

## 2017-12-06 ENCOUNTER — Other Ambulatory Visit: Payer: Self-pay

## 2017-12-06 ENCOUNTER — Ambulatory Visit (INDEPENDENT_AMBULATORY_CARE_PROVIDER_SITE_OTHER): Payer: Medicare HMO | Admitting: Family Medicine

## 2017-12-06 DIAGNOSIS — R0789 Other chest pain: Secondary | ICD-10-CM | POA: Diagnosis not present

## 2017-12-06 DIAGNOSIS — I5032 Chronic diastolic (congestive) heart failure: Secondary | ICD-10-CM | POA: Diagnosis not present

## 2017-12-06 DIAGNOSIS — J9611 Chronic respiratory failure with hypoxia: Secondary | ICD-10-CM | POA: Diagnosis not present

## 2017-12-06 DIAGNOSIS — M1990 Unspecified osteoarthritis, unspecified site: Secondary | ICD-10-CM | POA: Diagnosis not present

## 2017-12-06 MED ORDER — LIDOCAINE 5 % EX PTCH: 1.0000 | MEDICATED_PATCH | CUTANEOUS | 0 refills | Status: DC

## 2017-12-06 NOTE — Telephone Encounter (Signed)
Pt came back at 12:35pm after her appointment requesting to have another copy of the Rib belt prescription, stated she lost the prescription. Best phone # to contact is [phone].

## 2017-12-06 NOTE — Telephone Encounter (Signed)
I called patient to say I had written another RX.  No need.  She has found her first prescription.

## 2017-12-06 NOTE — Patient Instructions (Signed)
I sent the lidocaine patch and gave you a prescription for the rib belt.   Keep up the good work with your psychiatrist. See me in one month.  I hope your rib pain is fully gone by then.

## 2017-12-07 ENCOUNTER — Encounter: Payer: Self-pay | Admitting: Family Medicine

## 2017-12-07 DIAGNOSIS — F209 Schizophrenia, unspecified: Secondary | ICD-10-CM | POA: Diagnosis not present

## 2017-12-07 DIAGNOSIS — E669 Obesity, unspecified: Secondary | ICD-10-CM | POA: Diagnosis not present

## 2017-12-07 DIAGNOSIS — F411 Generalized anxiety disorder: Secondary | ICD-10-CM | POA: Diagnosis not present

## 2017-12-07 DIAGNOSIS — J449 Chronic obstructive pulmonary disease, unspecified: Secondary | ICD-10-CM | POA: Diagnosis not present

## 2017-12-07 DIAGNOSIS — J9612 Chronic respiratory failure with hypercapnia: Secondary | ICD-10-CM | POA: Diagnosis not present

## 2017-12-07 DIAGNOSIS — J9611 Chronic respiratory failure with hypoxia: Secondary | ICD-10-CM | POA: Diagnosis not present

## 2017-12-07 DIAGNOSIS — I272 Pulmonary hypertension, unspecified: Secondary | ICD-10-CM | POA: Diagnosis not present

## 2017-12-07 DIAGNOSIS — I5032 Chronic diastolic (congestive) heart failure: Secondary | ICD-10-CM | POA: Diagnosis not present

## 2017-12-07 DIAGNOSIS — F17210 Nicotine dependence, cigarettes, uncomplicated: Secondary | ICD-10-CM | POA: Diagnosis not present

## 2017-12-07 DIAGNOSIS — F330 Major depressive disorder, recurrent, mild: Secondary | ICD-10-CM | POA: Diagnosis not present

## 2017-12-07 DIAGNOSIS — G4733 Obstructive sleep apnea (adult) (pediatric): Secondary | ICD-10-CM | POA: Diagnosis not present

## 2017-12-07 DIAGNOSIS — M1990 Unspecified osteoarthritis, unspecified site: Secondary | ICD-10-CM | POA: Diagnosis not present

## 2017-12-07 NOTE — Assessment & Plan Note (Signed)
Wanted rib belt - cautioned to avoid overuse and to work on deep breathing.  Refilled lidoderm patches.  Set expectation that it might take 6-8 weeks total for pain to resolve.
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
## 2017-12-08 DIAGNOSIS — I5032 Chronic diastolic (congestive) heart failure: Secondary | ICD-10-CM | POA: Diagnosis not present

## 2017-12-08 DIAGNOSIS — M1990 Unspecified osteoarthritis, unspecified site: Secondary | ICD-10-CM | POA: Diagnosis not present

## 2017-12-08 DIAGNOSIS — J9611 Chronic respiratory failure with hypoxia: Secondary | ICD-10-CM | POA: Diagnosis not present

## 2017-12-09 DIAGNOSIS — J9611 Chronic respiratory failure with hypoxia: Secondary | ICD-10-CM | POA: Diagnosis not present

## 2017-12-09 DIAGNOSIS — I5032 Chronic diastolic (congestive) heart failure: Secondary | ICD-10-CM | POA: Diagnosis not present

## 2017-12-09 DIAGNOSIS — M1990 Unspecified osteoarthritis, unspecified site: Secondary | ICD-10-CM | POA: Diagnosis not present

## 2017-12-10 DIAGNOSIS — F064 Anxiety disorder due to known physiological condition: Secondary | ICD-10-CM | POA: Diagnosis not present

## 2017-12-10 DIAGNOSIS — F331 Major depressive disorder, recurrent, moderate: Secondary | ICD-10-CM | POA: Diagnosis not present

## 2017-12-11 ENCOUNTER — Other Ambulatory Visit: Payer: Medicare HMO | Admitting: Internal Medicine

## 2017-12-11 DIAGNOSIS — I272 Pulmonary hypertension, unspecified: Secondary | ICD-10-CM | POA: Diagnosis not present

## 2017-12-11 DIAGNOSIS — J9611 Chronic respiratory failure with hypoxia: Secondary | ICD-10-CM | POA: Diagnosis not present

## 2017-12-11 DIAGNOSIS — I5032 Chronic diastolic (congestive) heart failure: Secondary | ICD-10-CM | POA: Diagnosis not present

## 2017-12-11 DIAGNOSIS — G4733 Obstructive sleep apnea (adult) (pediatric): Secondary | ICD-10-CM | POA: Diagnosis not present

## 2017-12-11 DIAGNOSIS — E669 Obesity, unspecified: Secondary | ICD-10-CM | POA: Diagnosis not present

## 2017-12-11 DIAGNOSIS — F17210 Nicotine dependence, cigarettes, uncomplicated: Secondary | ICD-10-CM | POA: Diagnosis not present

## 2017-12-11 DIAGNOSIS — F209 Schizophrenia, unspecified: Secondary | ICD-10-CM | POA: Diagnosis not present

## 2017-12-11 DIAGNOSIS — J449 Chronic obstructive pulmonary disease, unspecified: Secondary | ICD-10-CM | POA: Diagnosis not present

## 2017-12-11 DIAGNOSIS — M1990 Unspecified osteoarthritis, unspecified site: Secondary | ICD-10-CM | POA: Diagnosis not present

## 2017-12-11 DIAGNOSIS — J9612 Chronic respiratory failure with hypercapnia: Secondary | ICD-10-CM | POA: Diagnosis not present

## 2017-12-11 DIAGNOSIS — Z515 Encounter for palliative care: Secondary | ICD-10-CM

## 2017-12-11 NOTE — Progress Notes (Signed)
  NOTE:  Arrived at patient's home for a routine followup/post hospital admission visit.  Received voicemail x 2 when pt was called.  Her dog that is known to be protective was sitting on front stoop.  Left a message on pt's voicemail to call this NP to reschedule today's appointment.

## 2017-12-12 DIAGNOSIS — I5032 Chronic diastolic (congestive) heart failure: Secondary | ICD-10-CM | POA: Diagnosis not present

## 2017-12-12 DIAGNOSIS — J9611 Chronic respiratory failure with hypoxia: Secondary | ICD-10-CM | POA: Diagnosis not present

## 2017-12-12 DIAGNOSIS — M1990 Unspecified osteoarthritis, unspecified site: Secondary | ICD-10-CM | POA: Diagnosis not present

## 2017-12-14 DIAGNOSIS — M6281 Muscle weakness (generalized): Secondary | ICD-10-CM | POA: Diagnosis not present

## 2017-12-14 DIAGNOSIS — M1990 Unspecified osteoarthritis, unspecified site: Secondary | ICD-10-CM | POA: Diagnosis not present

## 2017-12-14 DIAGNOSIS — J189 Pneumonia, unspecified organism: Secondary | ICD-10-CM | POA: Diagnosis not present

## 2017-12-14 DIAGNOSIS — G4733 Obstructive sleep apnea (adult) (pediatric): Secondary | ICD-10-CM | POA: Diagnosis not present

## 2017-12-14 DIAGNOSIS — I509 Heart failure, unspecified: Secondary | ICD-10-CM | POA: Diagnosis not present

## 2017-12-14 DIAGNOSIS — J440 Chronic obstructive pulmonary disease with acute lower respiratory infection: Secondary | ICD-10-CM | POA: Diagnosis not present

## 2017-12-14 DIAGNOSIS — R062 Wheezing: Secondary | ICD-10-CM | POA: Diagnosis not present

## 2017-12-14 DIAGNOSIS — R0602 Shortness of breath: Secondary | ICD-10-CM | POA: Diagnosis not present

## 2017-12-14 DIAGNOSIS — J45909 Unspecified asthma, uncomplicated: Secondary | ICD-10-CM | POA: Diagnosis not present

## 2017-12-14 DIAGNOSIS — I5032 Chronic diastolic (congestive) heart failure: Secondary | ICD-10-CM | POA: Diagnosis not present

## 2017-12-14 DIAGNOSIS — J9611 Chronic respiratory failure with hypoxia: Secondary | ICD-10-CM | POA: Diagnosis not present

## 2017-12-14 DIAGNOSIS — J449 Chronic obstructive pulmonary disease, unspecified: Secondary | ICD-10-CM | POA: Diagnosis not present

## 2017-12-15 ENCOUNTER — Encounter (HOSPITAL_COMMUNITY): Payer: Self-pay

## 2017-12-15 ENCOUNTER — Other Ambulatory Visit: Payer: Self-pay

## 2017-12-15 ENCOUNTER — Emergency Department (HOSPITAL_COMMUNITY)
Admission: EM | Admit: 2017-12-15 | Discharge: 2017-12-16 | Disposition: A | Discharge status: Home / Self Care | Payer: Medicare HMO | Attending: Emergency Medicine | Admitting: Emergency Medicine

## 2017-12-15 ENCOUNTER — Emergency Department (HOSPITAL_COMMUNITY): Payer: Medicare HMO

## 2017-12-15 DIAGNOSIS — B029 Zoster without complications: Secondary | ICD-10-CM | POA: Diagnosis not present

## 2017-12-15 DIAGNOSIS — J45909 Unspecified asthma, uncomplicated: Secondary | ICD-10-CM | POA: Insufficient documentation

## 2017-12-15 DIAGNOSIS — R0789 Other chest pain: Secondary | ICD-10-CM | POA: Insufficient documentation

## 2017-12-15 DIAGNOSIS — E1122 Type 2 diabetes mellitus with diabetic chronic kidney disease: Secondary | ICD-10-CM | POA: Diagnosis not present

## 2017-12-15 DIAGNOSIS — N189 Chronic kidney disease, unspecified: Secondary | ICD-10-CM | POA: Diagnosis not present

## 2017-12-15 DIAGNOSIS — Z87891 Personal history of nicotine dependence: Secondary | ICD-10-CM | POA: Insufficient documentation

## 2017-12-15 DIAGNOSIS — I10 Essential (primary) hypertension: Secondary | ICD-10-CM | POA: Diagnosis not present

## 2017-12-15 DIAGNOSIS — W19XXXA Unspecified fall, initial encounter: Secondary | ICD-10-CM

## 2017-12-15 DIAGNOSIS — I130 Hypertensive heart and chronic kidney disease with heart failure and stage 1 through stage 4 chronic kidney disease, or unspecified chronic kidney disease: Secondary | ICD-10-CM | POA: Diagnosis not present

## 2017-12-15 DIAGNOSIS — M5489 Other dorsalgia: Secondary | ICD-10-CM | POA: Diagnosis not present

## 2017-12-15 DIAGNOSIS — S299XXA Unspecified injury of thorax, initial encounter: Secondary | ICD-10-CM | POA: Diagnosis not present

## 2017-12-15 DIAGNOSIS — I5032 Chronic diastolic (congestive) heart failure: Secondary | ICD-10-CM | POA: Insufficient documentation

## 2017-12-15 DIAGNOSIS — R0781 Pleurodynia: Secondary | ICD-10-CM | POA: Diagnosis not present

## 2017-12-15 DIAGNOSIS — R52 Pain, unspecified: Secondary | ICD-10-CM | POA: Diagnosis not present

## 2017-12-15 MED ORDER — VALACYCLOVIR HCL 1 G PO TABS: 1000.0000 mg | ORAL_TABLET | Freq: Three times a day (TID) | ORAL | 0 refills | Status: AC

## 2017-12-15 NOTE — ED Notes (Signed)
Bed: WA20 Expected date:  Expected time:  Means of arrival:  Comments: EMS 57 yo female left flank pain/wrist pain-sores

## 2017-12-15 NOTE — ED Triage Notes (Signed)
Patient states she is here tonight because she has painful sores to her left breast. Patient has healing sores noted to underside left breast and few new areas to lateral breast-appears that patient has been scratching areas and has 1 skin tear noted. Patient denies scratching areas.

## 2017-12-15 NOTE — Discharge Instructions (Addendum)
You were evaluated in the Emergency Department and after careful evaluation, we did not find any emergent condition requiring admission or further testing in the hospital.  Your symptoms today seem to be due to continued pain from bruising from the fall.  You also appear to have shingles.  Please use the medication provided to treat the shingles.  Use Tylenol at home for pain, 1000 mg every 8 hours.  Please return to the Emergency Department if you experience any worsening of your condition.  We encourage you to follow up with a primary care provider.  Thank you for allowing [name] to be a part of your care.
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
## 2017-12-18 ENCOUNTER — Telehealth: Payer: Self-pay | Admitting: Family Medicine

## 2017-12-18 NOTE — Telephone Encounter (Signed)
Didn't route message first time - routing to [employer]

## 2017-12-18 NOTE — Telephone Encounter (Signed)
Called.  C/O ongoing pain.  Dxed with Shingles by ED.  Still open sores.  Recommended continued valcyclovir prescribed by the ED.  Has appointment to see me next week.

## 2017-12-18 NOTE — Telephone Encounter (Signed)
Pt would like Dr. Andria Frames to give her a call to discuss a couple of things. Please call pt back at [phone].

## 2017-12-19 DIAGNOSIS — F331 Major depressive disorder, recurrent, moderate: Secondary | ICD-10-CM | POA: Diagnosis not present

## 2017-12-19 DIAGNOSIS — F064 Anxiety disorder due to known physiological condition: Secondary | ICD-10-CM | POA: Diagnosis not present

## 2017-12-20 ENCOUNTER — Telehealth: Payer: Self-pay

## 2017-12-20 ENCOUNTER — Other Ambulatory Visit: Payer: Medicare HMO | Admitting: Internal Medicine

## 2017-12-20 DIAGNOSIS — E1140 Type 2 diabetes mellitus with diabetic neuropathy, unspecified: Secondary | ICD-10-CM | POA: Diagnosis not present

## 2017-12-20 DIAGNOSIS — G4733 Obstructive sleep apnea (adult) (pediatric): Secondary | ICD-10-CM | POA: Diagnosis not present

## 2017-12-20 DIAGNOSIS — J9611 Chronic respiratory failure with hypoxia: Secondary | ICD-10-CM | POA: Diagnosis not present

## 2017-12-20 DIAGNOSIS — I5032 Chronic diastolic (congestive) heart failure: Secondary | ICD-10-CM | POA: Diagnosis not present

## 2017-12-20 DIAGNOSIS — N183 Chronic kidney disease, stage 3 (moderate): Secondary | ICD-10-CM | POA: Diagnosis not present

## 2017-12-20 DIAGNOSIS — E1122 Type 2 diabetes mellitus with diabetic chronic kidney disease: Secondary | ICD-10-CM | POA: Diagnosis not present

## 2017-12-20 DIAGNOSIS — J449 Chronic obstructive pulmonary disease, unspecified: Secondary | ICD-10-CM | POA: Diagnosis not present

## 2017-12-20 DIAGNOSIS — I272 Pulmonary hypertension, unspecified: Secondary | ICD-10-CM | POA: Diagnosis not present

## 2017-12-20 DIAGNOSIS — I130 Hypertensive heart and chronic kidney disease with heart failure and stage 1 through stage 4 chronic kidney disease, or unspecified chronic kidney disease: Secondary | ICD-10-CM | POA: Diagnosis not present

## 2017-12-20 NOTE — Telephone Encounter (Signed)
Received message from patient regarding time of scheduled visit. Return VM left to make patient aware that visit is scheduled for today at 4:30pm

## 2017-12-21 ENCOUNTER — Telehealth: Payer: Self-pay | Admitting: Emergency Medicine

## 2017-12-21 DIAGNOSIS — Z79891 Long term (current) use of opiate analgesic: Secondary | ICD-10-CM | POA: Diagnosis not present

## 2017-12-21 DIAGNOSIS — J440 Chronic obstructive pulmonary disease with acute lower respiratory infection: Secondary | ICD-10-CM | POA: Diagnosis not present

## 2017-12-21 DIAGNOSIS — F411 Generalized anxiety disorder: Secondary | ICD-10-CM | POA: Diagnosis not present

## 2017-12-21 DIAGNOSIS — R0602 Shortness of breath: Secondary | ICD-10-CM | POA: Diagnosis not present

## 2017-12-21 DIAGNOSIS — M6281 Muscle weakness (generalized): Secondary | ICD-10-CM | POA: Diagnosis not present

## 2017-12-21 DIAGNOSIS — I509 Heart failure, unspecified: Secondary | ICD-10-CM | POA: Diagnosis not present

## 2017-12-21 DIAGNOSIS — J45909 Unspecified asthma, uncomplicated: Secondary | ICD-10-CM | POA: Diagnosis not present

## 2017-12-21 DIAGNOSIS — F330 Major depressive disorder, recurrent, mild: Secondary | ICD-10-CM | POA: Diagnosis not present

## 2017-12-21 DIAGNOSIS — J449 Chronic obstructive pulmonary disease, unspecified: Secondary | ICD-10-CM | POA: Diagnosis not present

## 2017-12-21 DIAGNOSIS — J189 Pneumonia, unspecified organism: Secondary | ICD-10-CM | POA: Diagnosis not present

## 2017-12-21 DIAGNOSIS — R062 Wheezing: Secondary | ICD-10-CM | POA: Diagnosis not present

## 2017-12-21 DIAGNOSIS — G4733 Obstructive sleep apnea (adult) (pediatric): Secondary | ICD-10-CM | POA: Diagnosis not present

## 2017-12-21 NOTE — Telephone Encounter (Signed)
Call made to [facility] from [facility], he wanted to inform us this patient cancelled her PT appointment. Will route this message to RB as FYI. Nothing further is needed at this time.

## 2017-12-24 ENCOUNTER — Other Ambulatory Visit: Payer: Self-pay

## 2017-12-24 ENCOUNTER — Telehealth: Payer: Self-pay | Admitting: Family Medicine

## 2017-12-24 ENCOUNTER — Ambulatory Visit (INDEPENDENT_AMBULATORY_CARE_PROVIDER_SITE_OTHER): Payer: Medicare HMO | Admitting: Family Medicine

## 2017-12-24 VITALS — BP 114/70 | HR 99 | Temp 98.6°F | Wt 184.1 lb

## 2017-12-24 DIAGNOSIS — J440 Chronic obstructive pulmonary disease with acute lower respiratory infection: Secondary | ICD-10-CM | POA: Diagnosis not present

## 2017-12-24 DIAGNOSIS — R310 Gross hematuria: Secondary | ICD-10-CM

## 2017-12-24 DIAGNOSIS — J189 Pneumonia, unspecified organism: Secondary | ICD-10-CM | POA: Diagnosis not present

## 2017-12-24 DIAGNOSIS — T148XXA Other injury of unspecified body region, initial encounter: Secondary | ICD-10-CM | POA: Diagnosis not present

## 2017-12-24 DIAGNOSIS — G4733 Obstructive sleep apnea (adult) (pediatric): Secondary | ICD-10-CM | POA: Diagnosis not present

## 2017-12-24 DIAGNOSIS — J45909 Unspecified asthma, uncomplicated: Secondary | ICD-10-CM | POA: Diagnosis not present

## 2017-12-24 DIAGNOSIS — R0602 Shortness of breath: Secondary | ICD-10-CM | POA: Diagnosis not present

## 2017-12-24 DIAGNOSIS — R0789 Other chest pain: Secondary | ICD-10-CM | POA: Diagnosis not present

## 2017-12-24 DIAGNOSIS — K625 Hemorrhage of anus and rectum: Secondary | ICD-10-CM | POA: Diagnosis not present

## 2017-12-24 DIAGNOSIS — R062 Wheezing: Secondary | ICD-10-CM | POA: Diagnosis not present

## 2017-12-24 LAB — POCT HEMOGLOBIN: Hemoglobin: 7.4 g/dL — AB (ref 9.5–13.5)

## 2017-12-24 MED ORDER — MUPIROCIN 2 % EX OINT: 1.0000 "application " | TOPICAL_OINTMENT | Freq: Two times a day (BID) | CUTANEOUS | 0 refills | Status: DC

## 2017-12-24 MED ORDER — MUPIROCIN CALCIUM 2 % NA OINT: 1.0000 "application " | TOPICAL_OINTMENT | Freq: Two times a day (BID) | NASAL | 0 refills | Status: DC

## 2017-12-24 MED ORDER — LIDOCAINE 5 % EX PTCH: 1.0000 | MEDICATED_PATCH | CUTANEOUS | 0 refills | Status: DC

## 2017-12-24 NOTE — Patient Instructions (Signed)
It was a pleasure to see you today! Thank you for choosing [facility] for your primary care. Tiffany [name] was seen for rectal bleeding. Your hemoglobin was low. Since you did not want to go to the hospital, please come back to check you hemoglobin in 1 week. You can use Bactroban cream on your sores.   Best,  Marny Lowenstein, MD, MS FAMILY MEDICINE RESIDENT - PGY2 12/24/2017 2:18 PM
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
## 2017-12-24 NOTE — Progress Notes (Signed)
3

## 2017-12-24 NOTE — Telephone Encounter (Signed)
Pt said that she missed Dr. Lowella Bandy phone call. [name] returning call and would like for him to call her back asap.

## 2017-12-25 ENCOUNTER — Emergency Department (HOSPITAL_COMMUNITY)
Admission: EM | Admit: 2017-12-25 | Discharge: 2017-12-25 | Disposition: A | Discharge status: Home / Self Care | Payer: Medicare HMO | Attending: Emergency Medicine | Admitting: Emergency Medicine

## 2017-12-25 ENCOUNTER — Emergency Department (HOSPITAL_COMMUNITY): Payer: Medicare HMO

## 2017-12-25 ENCOUNTER — Telehealth: Payer: Self-pay | Admitting: Emergency Medicine

## 2017-12-25 ENCOUNTER — Encounter (HOSPITAL_COMMUNITY): Payer: Self-pay | Admitting: *Deleted

## 2017-12-25 DIAGNOSIS — R0781 Pleurodynia: Secondary | ICD-10-CM | POA: Diagnosis not present

## 2017-12-25 DIAGNOSIS — B029 Zoster without complications: Secondary | ICD-10-CM

## 2017-12-25 DIAGNOSIS — N950 Postmenopausal bleeding: Secondary | ICD-10-CM | POA: Insufficient documentation

## 2017-12-25 DIAGNOSIS — R0789 Other chest pain: Secondary | ICD-10-CM | POA: Diagnosis present

## 2017-12-25 DIAGNOSIS — Z87891 Personal history of nicotine dependence: Secondary | ICD-10-CM | POA: Diagnosis not present

## 2017-12-25 DIAGNOSIS — I10 Essential (primary) hypertension: Secondary | ICD-10-CM | POA: Insufficient documentation

## 2017-12-25 DIAGNOSIS — R52 Pain, unspecified: Secondary | ICD-10-CM | POA: Diagnosis not present

## 2017-12-25 LAB — CBC WITH DIFFERENTIAL/PLATELET
Abs Immature Granulocytes: 0.09 10*3/uL — ABNORMAL HIGH (ref 0.00–0.07)
Basophils Absolute: 0 10*3/uL (ref 0.0–0.1)
Basophils Relative: 0 %
Eosinophils Absolute: 0.3 10*3/uL (ref 0.0–0.5)
Eosinophils Relative: 3 %
HCT: 30.2 % — ABNORMAL LOW (ref 36.0–46.0)
Hemoglobin: 9 g/dL — ABNORMAL LOW (ref 12.0–15.0)
Immature Granulocytes: 1 %
Lymphocytes Relative: 22 %
Lymphs Abs: 2.4 10*3/uL (ref 0.7–4.0)
MCH: 28.1 pg (ref 26.0–34.0)
MCHC: 29.8 g/dL — ABNORMAL LOW (ref 30.0–36.0)
MCV: 94.4 fL (ref 80.0–100.0)
Monocytes Absolute: 0.9 10*3/uL (ref 0.1–1.0)
Monocytes Relative: 8 %
Neutro Abs: 7.3 10*3/uL (ref 1.7–7.7)
Neutrophils Relative %: 66 %
Platelets: 282 10*3/uL (ref 150–400)
RBC: 3.2 MIL/uL — ABNORMAL LOW (ref 3.87–5.11)
RDW: 15 % (ref 11.5–15.5)
WBC: 11.1 10*3/uL — ABNORMAL HIGH (ref 4.0–10.5)
nRBC: 0 % (ref 0.0–0.2)

## 2017-12-25 LAB — TYPE AND SCREEN
ABO/RH(D): O POS
Antibody Screen: NEGATIVE

## 2017-12-25 LAB — COMPREHENSIVE METABOLIC PANEL
ALT: 23 U/L (ref 0–44)
AST: 28 U/L (ref 15–41)
Albumin: 3 g/dL — ABNORMAL LOW (ref 3.5–5.0)
Alkaline Phosphatase: 186 U/L — ABNORMAL HIGH (ref 38–126)
Anion gap: 11 (ref 5–15)
BUN: 26 mg/dL — ABNORMAL HIGH (ref 6–20)
CO2: 25 mmol/L (ref 22–32)
Calcium: 8.8 mg/dL — ABNORMAL LOW (ref 8.9–10.3)
Chloride: 104 mmol/L (ref 98–111)
Creatinine, Ser: 2.19 mg/dL — ABNORMAL HIGH (ref 0.44–1.00)
GFR calc Af Amer: 28 mL/min — ABNORMAL LOW (ref >60)
GFR calc non Af Amer: 24 mL/min — ABNORMAL LOW (ref >60)
Glucose, Bld: 81 mg/dL (ref 70–99)
Potassium: 3.8 mmol/L (ref 3.5–5.1)
Sodium: 140 mmol/L (ref 135–145)
Total Bilirubin: 0.4 mg/dL (ref 0.3–1.2)
Total Protein: 6.2 g/dL — ABNORMAL LOW (ref 6.5–8.1)

## 2017-12-25 LAB — URINALYSIS, ROUTINE W REFLEX MICROSCOPIC
Bilirubin Urine: NEGATIVE
Glucose, UA: NEGATIVE mg/dL
Hgb urine dipstick: NEGATIVE
Ketones, ur: NEGATIVE mg/dL
Leukocytes, UA: NEGATIVE
Nitrite: NEGATIVE
Protein, ur: NEGATIVE mg/dL
Specific Gravity, Urine: 1.005 (ref 1.005–1.030)
pH: 5 (ref 5.0–8.0)

## 2017-12-25 LAB — POC OCCULT BLOOD, ED: Fecal Occult Bld: NEGATIVE

## 2017-12-25 LAB — PROTIME-INR
INR: 1.13
Prothrombin Time: 14.4 seconds (ref 11.4–15.2)

## 2017-12-25 MED ORDER — SODIUM CHLORIDE 0.9 % IV SOLN
8.0000 mg/h | INTRAVENOUS | Status: DC
  Administered 2017-12-25: 8 mg/h via INTRAVENOUS
  Filled 2017-12-25: qty 80

## 2017-12-25 MED ORDER — OXYCODONE-ACETAMINOPHEN 5-325 MG PO TABS: 1.0000 | ORAL_TABLET | ORAL | 0 refills | Status: DC | PRN

## 2017-12-25 MED ORDER — SODIUM CHLORIDE 0.9 % IV SOLN
80.0000 mg | Freq: Once | INTRAVENOUS | Status: AC
  Administered 2017-12-25: 80 mg via INTRAVENOUS
  Filled 2017-12-25: qty 80

## 2017-12-25 MED ORDER — FENTANYL CITRATE (PF) 100 MCG/2ML IJ SOLN
50.0000 ug | Freq: Once | INTRAMUSCULAR | Status: AC
  Administered 2017-12-25: 50 ug via INTRAVENOUS
  Filled 2017-12-25: qty 2

## 2017-12-25 NOTE — Telephone Encounter (Signed)
Pt asking for Dr. Andria Frames to give her a call back. Please advise

## 2017-12-25 NOTE — ED Notes (Signed)
Patient has questions for provider regarding discharge. Patient will be discharged after questions are answered.
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
## 2017-12-25 NOTE — ED Notes (Signed)
Patient given discharge teaching and verbalized understanding. Patient taken out of ED with a wheelchair.

## 2017-12-25 NOTE — ED Notes (Addendum)
Patient feeling weak, waiting on orthostatics

## 2017-12-25 NOTE — ED Notes (Signed)
Patient transported to X-ray 

## 2017-12-25 NOTE — ED Notes (Signed)
Patient feeling weak, waiting on orthostatics

## 2017-12-25 NOTE — ED Notes (Signed)
Bed: WA07 Expected date:  Expected time:  Means of arrival:  Comments: EMS-possible infection-triage 2
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
## 2017-12-25 NOTE — Telephone Encounter (Signed)
Called patient unable to reach left message to give [name] a call back. Will route to RB as an [place].

## 2017-12-27 ENCOUNTER — Ambulatory Visit: Payer: Self-pay | Admitting: Family Medicine

## 2017-12-27 ENCOUNTER — Telehealth: Payer: Self-pay

## 2017-12-27 NOTE — Telephone Encounter (Signed)
Follow up visit scheduled with Palliative Care on 12/31/17

## 2017-12-28 ENCOUNTER — Telehealth: Payer: Self-pay

## 2017-12-28 NOTE — Telephone Encounter (Signed)
Patient called nurse line. States she needs her at-home care hours increased from 2.5 hours per day through [facility]. Gave number os [phone].  Her call back is [phone]  Danley Danker, RN [facility] RN)

## 2017-12-30 ENCOUNTER — Encounter (HOSPITAL_COMMUNITY): Payer: Self-pay

## 2017-12-30 ENCOUNTER — Other Ambulatory Visit: Payer: Self-pay

## 2017-12-30 ENCOUNTER — Ambulatory Visit (HOSPITAL_COMMUNITY)
Admission: EM | Admit: 2017-12-30 | Discharge: 2017-12-30 | Disposition: A | Discharge status: Home / Self Care | Payer: Medicare HMO | Attending: Internal Medicine | Admitting: Internal Medicine

## 2017-12-30 DIAGNOSIS — T148XXA Other injury of unspecified body region, initial encounter: Secondary | ICD-10-CM | POA: Insufficient documentation

## 2017-12-30 DIAGNOSIS — N6489 Other specified disorders of breast: Secondary | ICD-10-CM | POA: Diagnosis not present

## 2017-12-30 DIAGNOSIS — L089 Local infection of the skin and subcutaneous tissue, unspecified: Secondary | ICD-10-CM | POA: Insufficient documentation

## 2017-12-30 DIAGNOSIS — N938 Other specified abnormal uterine and vaginal bleeding: Secondary | ICD-10-CM | POA: Diagnosis not present

## 2017-12-30 DIAGNOSIS — N939 Abnormal uterine and vaginal bleeding, unspecified: Secondary | ICD-10-CM | POA: Insufficient documentation

## 2017-12-30 DIAGNOSIS — S21002A Unspecified open wound of left breast, initial encounter: Secondary | ICD-10-CM | POA: Diagnosis not present

## 2017-12-30 MED ORDER — BACITRACIN ZINC 500 UNIT/GM EX OINT
TOPICAL_OINTMENT | CUTANEOUS | Status: AC
  Filled 2017-12-30: qty 28.35

## 2017-12-30 MED ORDER — DOXYCYCLINE HYCLATE 100 MG PO TABS: 100.0000 mg | ORAL_TABLET | Freq: Two times a day (BID) | ORAL | 0 refills | Status: DC

## 2017-12-30 NOTE — Discharge Instructions (Addendum)
You were given a prescription for antibiotic ointment and the medication notates you have not started this yet.  I am placing you on oral antibiotic in the mean time to cover for skin infection with is minimal today.  Take Tylenol 500 mg 2 every 8 hours for pain Follow up with your family Dr tomorrow. Continue using the Lidoderm patches for pain as prescribed by the ER Your urine does not have any blood.  Please follow up with your primary care Dr.
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
## 2017-12-31 ENCOUNTER — Other Ambulatory Visit: Payer: Medicare HMO | Admitting: Internal Medicine

## 2017-12-31 ENCOUNTER — Other Ambulatory Visit: Payer: Self-pay

## 2017-12-31 ENCOUNTER — Ambulatory Visit (INDEPENDENT_AMBULATORY_CARE_PROVIDER_SITE_OTHER): Payer: Medicare HMO | Admitting: Family Medicine

## 2017-12-31 ENCOUNTER — Encounter: Payer: Self-pay | Admitting: Family Medicine

## 2017-12-31 VITALS — BP 124/78 | HR 95 | Temp 98.5°F | Ht 64.0 in | Wt 194.4 lb

## 2017-12-31 DIAGNOSIS — N898 Other specified noninflammatory disorders of vagina: Secondary | ICD-10-CM | POA: Diagnosis not present

## 2017-12-31 DIAGNOSIS — S21002D Unspecified open wound of left breast, subsequent encounter: Secondary | ICD-10-CM | POA: Diagnosis not present

## 2017-12-31 MED ORDER — ZINC OXIDE 40 % EX PSTE: 1.0000 "application " | PASTE | Freq: Two times a day (BID) | CUTANEOUS | 1 refills | Status: DC | PRN

## 2017-12-31 MED ORDER — METRONIDAZOLE 500 MG PO TABS: 500.0000 mg | ORAL_TABLET | Freq: Two times a day (BID) | ORAL | 0 refills | Status: DC

## 2017-12-31 MED ORDER — FLUCONAZOLE 150 MG PO TABS: ORAL_TABLET | ORAL | 0 refills | Status: DC

## 2017-12-31 NOTE — Telephone Encounter (Signed)
Called and spoke to [place].  As PCP, I will need to fill out form "DMA [id number]" to request services and fax to them at Fax [phone].
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
## 2017-12-31 NOTE — Patient Instructions (Signed)
Thank you for coming to see me today. It was a pleasure! Today we talked about:   Your itching and discharge.  I have sent an antibiotic to your pharmacy.  Please take metronidazole 2 times a day for 7 days.  Please do not drink alcohol while you are taking this medication.  After you have finished the metronidazole please take the Diflucan tablet once and then 3 days later please take another tablet.  This will help treat for any yeast infection or bacterial infection.  I have also sent a cream called Desitin to your pharmacy.  Please apply this to the area that is bothering you and itching on your rear end.  Please follow-up with your regular doctor as needed.  If you have any questions or concerns, please do not hesitate to call the office at [phone].  Take Care,   [place] Maddoxx Burkitt, DO

## 2018-01-01 NOTE — Telephone Encounter (Signed)
Clinical info completed on DMA-3051 form.  Place form in Dr. Lowella Bandy box for completion.  Ottis Stain, CMA

## 2018-01-01 NOTE — Telephone Encounter (Signed)
Form DMA 6386 printed and placed in white team folder. Danley Danker, RN [facility] RN)

## 2018-01-02 NOTE — Telephone Encounter (Signed)
Form completed and placed to be faxed.

## 2018-01-03 ENCOUNTER — Ambulatory Visit: Payer: Self-pay | Admitting: Family Medicine

## 2018-01-03 ENCOUNTER — Ambulatory Visit: Payer: Self-pay | Admitting: Emergency Medicine

## 2018-01-03 DIAGNOSIS — S21009A Unspecified open wound of unspecified breast, initial encounter: Secondary | ICD-10-CM | POA: Insufficient documentation

## 2018-01-03 DIAGNOSIS — N898 Other specified noninflammatory disorders of vagina: Secondary | ICD-10-CM | POA: Insufficient documentation

## 2018-01-03 NOTE — Assessment & Plan Note (Signed)
Patient with well-healing wound on her left breast.  Given instruction on proper wound care.  Patient to return if she notes any fever or discharge from area.

## 2018-01-03 NOTE — Assessment & Plan Note (Signed)
We will treat patient as if she has BV given character of discharge and odor noted on patient's depends.  We will also treat for yeast given the patient is reporting itching.  Patient previously has refused vaginal exam and do not believe that she will tolerate this well.  Visual vaginal exam performed by Dr. Andria Frames with me chaperoning. - Treatment with Flagyl 500 BID x 7 days. -Follow treatment with Flagyl with Diflucan times once and then 3 days later patient to take another Diflucan - F/U if symptoms not improving or getting worse.   - F/U with PCP as needed.  - Return precautions including abdominal pain, fever, chills, nausea, or vomiting given.

## 2018-01-04 NOTE — Telephone Encounter (Signed)
Received fax back.  Pts ID was wrong (we used medicare number instead of medicaid number) and in step 4 all paragraphs need to be initialed.   Recompleted a new form and had Dr. Andria Frames sign.  Copy placed in batch scanning and original placed in to be faxed pile. Lachelle Rissler, [name], CMA

## 2018-01-07 ENCOUNTER — Telehealth: Payer: Self-pay | Admitting: Family Medicine

## 2018-01-07 ENCOUNTER — Other Ambulatory Visit: Payer: Medicare HMO | Admitting: Internal Medicine

## 2018-01-07 MED ORDER — METRONIDAZOLE 500 MG PO TABS: 500.0000 mg | ORAL_TABLET | Freq: Two times a day (BID) | ORAL | 0 refills | Status: AC

## 2018-01-07 NOTE — Telephone Encounter (Signed)
Still with foul smelling vaginal discharge.  Will refill flagyl and ask her to make a follow up appointment.

## 2018-01-07 NOTE — Telephone Encounter (Signed)
Pt is calling to let Dr. Andria Frames know that her vaginal infection is getting worse. She would like for Dr. Andria Frames to call her.

## 2018-01-10 ENCOUNTER — Telehealth: Payer: Self-pay | Admitting: Emergency Medicine

## 2018-01-10 ENCOUNTER — Encounter (HOSPITAL_COMMUNITY): Payer: Self-pay

## 2018-01-10 DIAGNOSIS — Z79899 Other long term (current) drug therapy: Secondary | ICD-10-CM | POA: Insufficient documentation

## 2018-01-10 DIAGNOSIS — I272 Pulmonary hypertension, unspecified: Secondary | ICD-10-CM | POA: Diagnosis not present

## 2018-01-10 DIAGNOSIS — I129 Hypertensive chronic kidney disease with stage 1 through stage 4 chronic kidney disease, or unspecified chronic kidney disease: Secondary | ICD-10-CM | POA: Insufficient documentation

## 2018-01-10 DIAGNOSIS — R300 Dysuria: Secondary | ICD-10-CM | POA: Insufficient documentation

## 2018-01-10 DIAGNOSIS — J9611 Chronic respiratory failure with hypoxia: Secondary | ICD-10-CM | POA: Diagnosis not present

## 2018-01-10 DIAGNOSIS — R58 Hemorrhage, not elsewhere classified: Secondary | ICD-10-CM | POA: Diagnosis not present

## 2018-01-10 DIAGNOSIS — Z87891 Personal history of nicotine dependence: Secondary | ICD-10-CM | POA: Diagnosis not present

## 2018-01-10 DIAGNOSIS — I5032 Chronic diastolic (congestive) heart failure: Secondary | ICD-10-CM | POA: Diagnosis not present

## 2018-01-10 DIAGNOSIS — R52 Pain, unspecified: Secondary | ICD-10-CM | POA: Insufficient documentation

## 2018-01-10 DIAGNOSIS — J449 Chronic obstructive pulmonary disease, unspecified: Secondary | ICD-10-CM | POA: Insufficient documentation

## 2018-01-10 DIAGNOSIS — T7421XA Adult sexual abuse, confirmed, initial encounter: Secondary | ICD-10-CM | POA: Diagnosis not present

## 2018-01-10 DIAGNOSIS — J45909 Unspecified asthma, uncomplicated: Secondary | ICD-10-CM | POA: Diagnosis not present

## 2018-01-10 DIAGNOSIS — I130 Hypertensive heart and chronic kidney disease with heart failure and stage 1 through stage 4 chronic kidney disease, or unspecified chronic kidney disease: Secondary | ICD-10-CM | POA: Diagnosis not present

## 2018-01-10 DIAGNOSIS — G4733 Obstructive sleep apnea (adult) (pediatric): Secondary | ICD-10-CM | POA: Diagnosis not present

## 2018-01-10 DIAGNOSIS — N183 Chronic kidney disease, stage 3 (moderate): Secondary | ICD-10-CM | POA: Diagnosis not present

## 2018-01-10 DIAGNOSIS — E119 Type 2 diabetes mellitus without complications: Secondary | ICD-10-CM | POA: Diagnosis not present

## 2018-01-10 DIAGNOSIS — S21002A Unspecified open wound of left breast, initial encounter: Secondary | ICD-10-CM | POA: Diagnosis not present

## 2018-01-10 DIAGNOSIS — E1140 Type 2 diabetes mellitus with diabetic neuropathy, unspecified: Secondary | ICD-10-CM | POA: Diagnosis not present

## 2018-01-10 DIAGNOSIS — E1122 Type 2 diabetes mellitus with diabetic chronic kidney disease: Secondary | ICD-10-CM | POA: Diagnosis not present

## 2018-01-10 DIAGNOSIS — M79601 Pain in right arm: Secondary | ICD-10-CM | POA: Diagnosis not present

## 2018-01-10 DIAGNOSIS — R531 Weakness: Secondary | ICD-10-CM | POA: Diagnosis not present

## 2018-01-10 NOTE — Telephone Encounter (Signed)
Tiffany [name] returned phone call, she states she is concerned with the patients progress over all, she repeatedly cancels and she went to see her today but she was very fatigued and falling asleep during the visit. States she has not been sleeping at night. [name] recommending that we cancel PT for now due to lack of progress until patient is feeling better.   Call made to patient she states she is having increased SOB and was trying to call to get an appointment. She states she can tell that she is retaining fluid and is unable to do her usual activities due to her SOB. Appointment made for 12.27.19. Nothing further is needed at this time.
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
## 2018-01-10 NOTE — Telephone Encounter (Signed)
States she is concerned about the patient and would like to discuss this further with the nurse.
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
## 2018-01-10 NOTE — Telephone Encounter (Signed)
LMTCB

## 2018-01-11 ENCOUNTER — Ambulatory Visit: Payer: Self-pay | Admitting: Pulmonary Disease

## 2018-01-11 ENCOUNTER — Emergency Department (HOSPITAL_COMMUNITY)
Admission: EM | Admit: 2018-01-11 | Discharge: 2018-01-11 | Disposition: A | Discharge status: Home / Self Care | Payer: Medicare HMO | Attending: Emergency Medicine | Admitting: Emergency Medicine

## 2018-01-11 DIAGNOSIS — R6889 Other general symptoms and signs: Secondary | ICD-10-CM

## 2018-01-11 MED ORDER — ACETAMINOPHEN 325 MG PO TABS
650.0000 mg | ORAL_TABLET | Freq: Once | ORAL | Status: AC
  Administered 2018-01-11: 650 mg via ORAL
  Filled 2018-01-11: qty 2
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
## 2018-01-11 NOTE — ED Notes (Signed)
Patient stating she would like to leave. EDPA notified and is at bedside.

## 2018-01-11 NOTE — Discharge Instructions (Addendum)
You need to follow-up with your primary care doctor.  You can take tylenol for pain. If you do not want to go there, you can follow-up with women's clinic about any ongoing GYN issues.

## 2018-01-13 DIAGNOSIS — R062 Wheezing: Secondary | ICD-10-CM | POA: Diagnosis not present

## 2018-01-13 DIAGNOSIS — M6281 Muscle weakness (generalized): Secondary | ICD-10-CM | POA: Diagnosis not present

## 2018-01-13 DIAGNOSIS — J189 Pneumonia, unspecified organism: Secondary | ICD-10-CM | POA: Diagnosis not present

## 2018-01-13 DIAGNOSIS — J440 Chronic obstructive pulmonary disease with acute lower respiratory infection: Secondary | ICD-10-CM | POA: Diagnosis not present

## 2018-01-13 DIAGNOSIS — I509 Heart failure, unspecified: Secondary | ICD-10-CM | POA: Diagnosis not present

## 2018-01-13 DIAGNOSIS — J449 Chronic obstructive pulmonary disease, unspecified: Secondary | ICD-10-CM | POA: Diagnosis not present

## 2018-01-13 DIAGNOSIS — R0602 Shortness of breath: Secondary | ICD-10-CM | POA: Diagnosis not present

## 2018-01-13 DIAGNOSIS — J45909 Unspecified asthma, uncomplicated: Secondary | ICD-10-CM | POA: Diagnosis not present

## 2018-01-13 DIAGNOSIS — G4733 Obstructive sleep apnea (adult) (pediatric): Secondary | ICD-10-CM | POA: Diagnosis not present

## 2018-01-13 NOTE — Progress Notes (Signed)
[facility] Telephone: [phone] Fax: [phone]  PATIENT NAME: Tiffany [name] DOB: 06-Jan-1961 MRN: [id number]  NOTE:  Arrived at patient's home for followup appointment.  Resident at home stated that patient was not as home and did not know when she would return home.  Asked resident to have patient call this NP to have appointment rescheduled.  Palliative care will F/U with pt. As well to reschedule.   Gonzella Lex, NP-C

## 2018-01-14 ENCOUNTER — Ambulatory Visit: Payer: Self-pay

## 2018-01-14 ENCOUNTER — Ambulatory Visit: Payer: Medicare HMO | Admitting: Licensed Clinical Social Worker

## 2018-01-14 DIAGNOSIS — F330 Major depressive disorder, recurrent, mild: Secondary | ICD-10-CM | POA: Diagnosis not present

## 2018-01-14 DIAGNOSIS — F5081 Binge eating disorder: Secondary | ICD-10-CM | POA: Diagnosis not present

## 2018-01-14 DIAGNOSIS — F411 Generalized anxiety disorder: Secondary | ICD-10-CM | POA: Diagnosis not present

## 2018-01-18 ENCOUNTER — Ambulatory Visit: Payer: Self-pay | Admitting: Family Medicine

## 2018-01-21 ENCOUNTER — Encounter (HOSPITAL_COMMUNITY): Payer: Self-pay

## 2018-01-21 ENCOUNTER — Ambulatory Visit: Payer: Medicare HMO | Admitting: Family Medicine

## 2018-01-21 ENCOUNTER — Telehealth: Payer: Self-pay | Admitting: Family Medicine

## 2018-01-21 ENCOUNTER — Ambulatory Visit: Payer: Self-pay | Admitting: Primary Care

## 2018-01-21 ENCOUNTER — Ambulatory Visit (INDEPENDENT_AMBULATORY_CARE_PROVIDER_SITE_OTHER): Payer: Medicare HMO

## 2018-01-21 ENCOUNTER — Other Ambulatory Visit: Payer: Self-pay

## 2018-01-21 ENCOUNTER — Ambulatory Visit (HOSPITAL_COMMUNITY)
Admission: EM | Admit: 2018-01-21 | Discharge: 2018-01-21 | Disposition: A | Discharge status: Home / Self Care | Payer: Medicare HMO | Attending: Internal Medicine | Admitting: Internal Medicine

## 2018-01-21 DIAGNOSIS — S2232XA Fracture of one rib, left side, initial encounter for closed fracture: Secondary | ICD-10-CM | POA: Diagnosis present

## 2018-01-21 DIAGNOSIS — I509 Heart failure, unspecified: Secondary | ICD-10-CM | POA: Diagnosis not present

## 2018-01-21 DIAGNOSIS — J189 Pneumonia, unspecified organism: Secondary | ICD-10-CM | POA: Diagnosis not present

## 2018-01-21 DIAGNOSIS — N898 Other specified noninflammatory disorders of vagina: Secondary | ICD-10-CM | POA: Insufficient documentation

## 2018-01-21 DIAGNOSIS — J449 Chronic obstructive pulmonary disease, unspecified: Secondary | ICD-10-CM | POA: Diagnosis not present

## 2018-01-21 DIAGNOSIS — R062 Wheezing: Secondary | ICD-10-CM | POA: Diagnosis not present

## 2018-01-21 DIAGNOSIS — J45909 Unspecified asthma, uncomplicated: Secondary | ICD-10-CM | POA: Diagnosis not present

## 2018-01-21 DIAGNOSIS — M6281 Muscle weakness (generalized): Secondary | ICD-10-CM | POA: Diagnosis not present

## 2018-01-21 DIAGNOSIS — S299XXA Unspecified injury of thorax, initial encounter: Secondary | ICD-10-CM | POA: Diagnosis not present

## 2018-01-21 DIAGNOSIS — G4733 Obstructive sleep apnea (adult) (pediatric): Secondary | ICD-10-CM | POA: Diagnosis not present

## 2018-01-21 DIAGNOSIS — R0602 Shortness of breath: Secondary | ICD-10-CM | POA: Diagnosis not present

## 2018-01-21 DIAGNOSIS — J440 Chronic obstructive pulmonary disease with acute lower respiratory infection: Secondary | ICD-10-CM | POA: Diagnosis not present

## 2018-01-21 DIAGNOSIS — R0781 Pleurodynia: Secondary | ICD-10-CM | POA: Diagnosis not present

## 2018-01-21 MED ORDER — DICLOFENAC SODIUM 1 % TD GEL: 2.0000 g | Freq: Four times a day (QID) | TRANSDERMAL | 0 refills | Status: DC
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
## 2018-01-21 NOTE — Telephone Encounter (Signed)
Called and discussed.  I filled out PCS forms several tiems.  Now I get a response from [facility] that "Patient currently receives maximum hours."  I doubt that she canget any more hours.  Now she is asking for a therapy dog.  I told her frankly that the dog will not solve her problems.  She will  Either need to do more for herself or she will need to move to assisted. Living.  Patient has an aid and a case worker working with her to explore options.

## 2018-01-21 NOTE — Discharge Instructions (Signed)
There appears to be a fracture of your 7th rib on the left side This should heal on its own Continue tylenol, lidocaine patches May add in voltaren gel 4 times a day topically to left side Ice  Please take deep breaths- rib fractures put you at risk for developing pneumonia, if you develop fever, cough, shortness of breath please follow up
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
## 2018-01-21 NOTE — Telephone Encounter (Signed)
Pt would like for Dr. Andria Frames to call her to discuss starting in home health care. She said her son is moving out and she needs help 24/7.  The best call back number is [phone].

## 2018-01-22 ENCOUNTER — Other Ambulatory Visit: Payer: Self-pay | Admitting: Cardiology

## 2018-01-22 ENCOUNTER — Other Ambulatory Visit: Payer: Medicare HMO | Admitting: Internal Medicine

## 2018-01-22 LAB — HIV ANTIBODY (ROUTINE TESTING W REFLEX): HIV Screen 4th Generation wRfx: NONREACTIVE

## 2018-01-22 LAB — CERVICOVAGINAL ANCILLARY ONLY
Bacterial vaginitis: POSITIVE — AB
Candida vaginitis: NEGATIVE
Chlamydia: NEGATIVE
Neisseria Gonorrhea: NEGATIVE
Trichomonas: NEGATIVE

## 2018-01-23 ENCOUNTER — Telehealth (HOSPITAL_COMMUNITY): Payer: Self-pay | Admitting: Emergency Medicine

## 2018-01-23 ENCOUNTER — Telehealth: Payer: Self-pay | Admitting: Family Medicine

## 2018-01-23 DIAGNOSIS — R479 Unspecified speech disturbances: Secondary | ICD-10-CM

## 2018-01-23 DIAGNOSIS — R296 Repeated falls: Secondary | ICD-10-CM

## 2018-01-23 MED ORDER — METRONIDAZOLE 500 MG PO TABS: 500.0000 mg | ORAL_TABLET | Freq: Two times a day (BID) | ORAL | 0 refills | Status: DC

## 2018-01-23 NOTE — Telephone Encounter (Signed)
Bacterial vaginosis is positive. This was not treated at the urgent care visit.  Flagyl 500 mg BID x 7 days #14 no refills sent to patients pharmacy of choice.   Patient contacted and made aware, all questions answered.

## 2018-01-23 NOTE — Telephone Encounter (Signed)
Pt called and would like Dr. Andria Frames to call her to discuss starting Speech Therapy. She would also like to do physical therapy for her legs.   The best call back number is [phone]

## 2018-01-24 ENCOUNTER — Telehealth: Payer: Self-pay | Admitting: Emergency Medicine

## 2018-01-24 DIAGNOSIS — J45909 Unspecified asthma, uncomplicated: Secondary | ICD-10-CM | POA: Diagnosis not present

## 2018-01-24 DIAGNOSIS — R0602 Shortness of breath: Secondary | ICD-10-CM | POA: Diagnosis not present

## 2018-01-24 DIAGNOSIS — G4733 Obstructive sleep apnea (adult) (pediatric): Secondary | ICD-10-CM | POA: Diagnosis not present

## 2018-01-24 DIAGNOSIS — R479 Unspecified speech disturbances: Secondary | ICD-10-CM | POA: Insufficient documentation

## 2018-01-24 DIAGNOSIS — J189 Pneumonia, unspecified organism: Secondary | ICD-10-CM | POA: Diagnosis not present

## 2018-01-24 DIAGNOSIS — J440 Chronic obstructive pulmonary disease with acute lower respiratory infection: Secondary | ICD-10-CM | POA: Diagnosis not present

## 2018-01-24 DIAGNOSIS — R062 Wheezing: Secondary | ICD-10-CM | POA: Diagnosis not present

## 2018-01-24 HISTORY — DX: Unspecified speech disturbances: R47.9
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
## 2018-01-24 NOTE — Assessment & Plan Note (Signed)
Speech eval per patient request.

## 2018-01-24 NOTE — Telephone Encounter (Signed)
Called and left message for Ria Comment [facility]  to call back.

## 2018-01-27 IMAGING — CR DG CHEST 2V
2 series · 2 of 2 positions shown · non-contrast
Comparison: 07/07/2014

CLINICAL DATA: Intermittent cough

EXAM:
CHEST  2 VIEW

[chest pa]
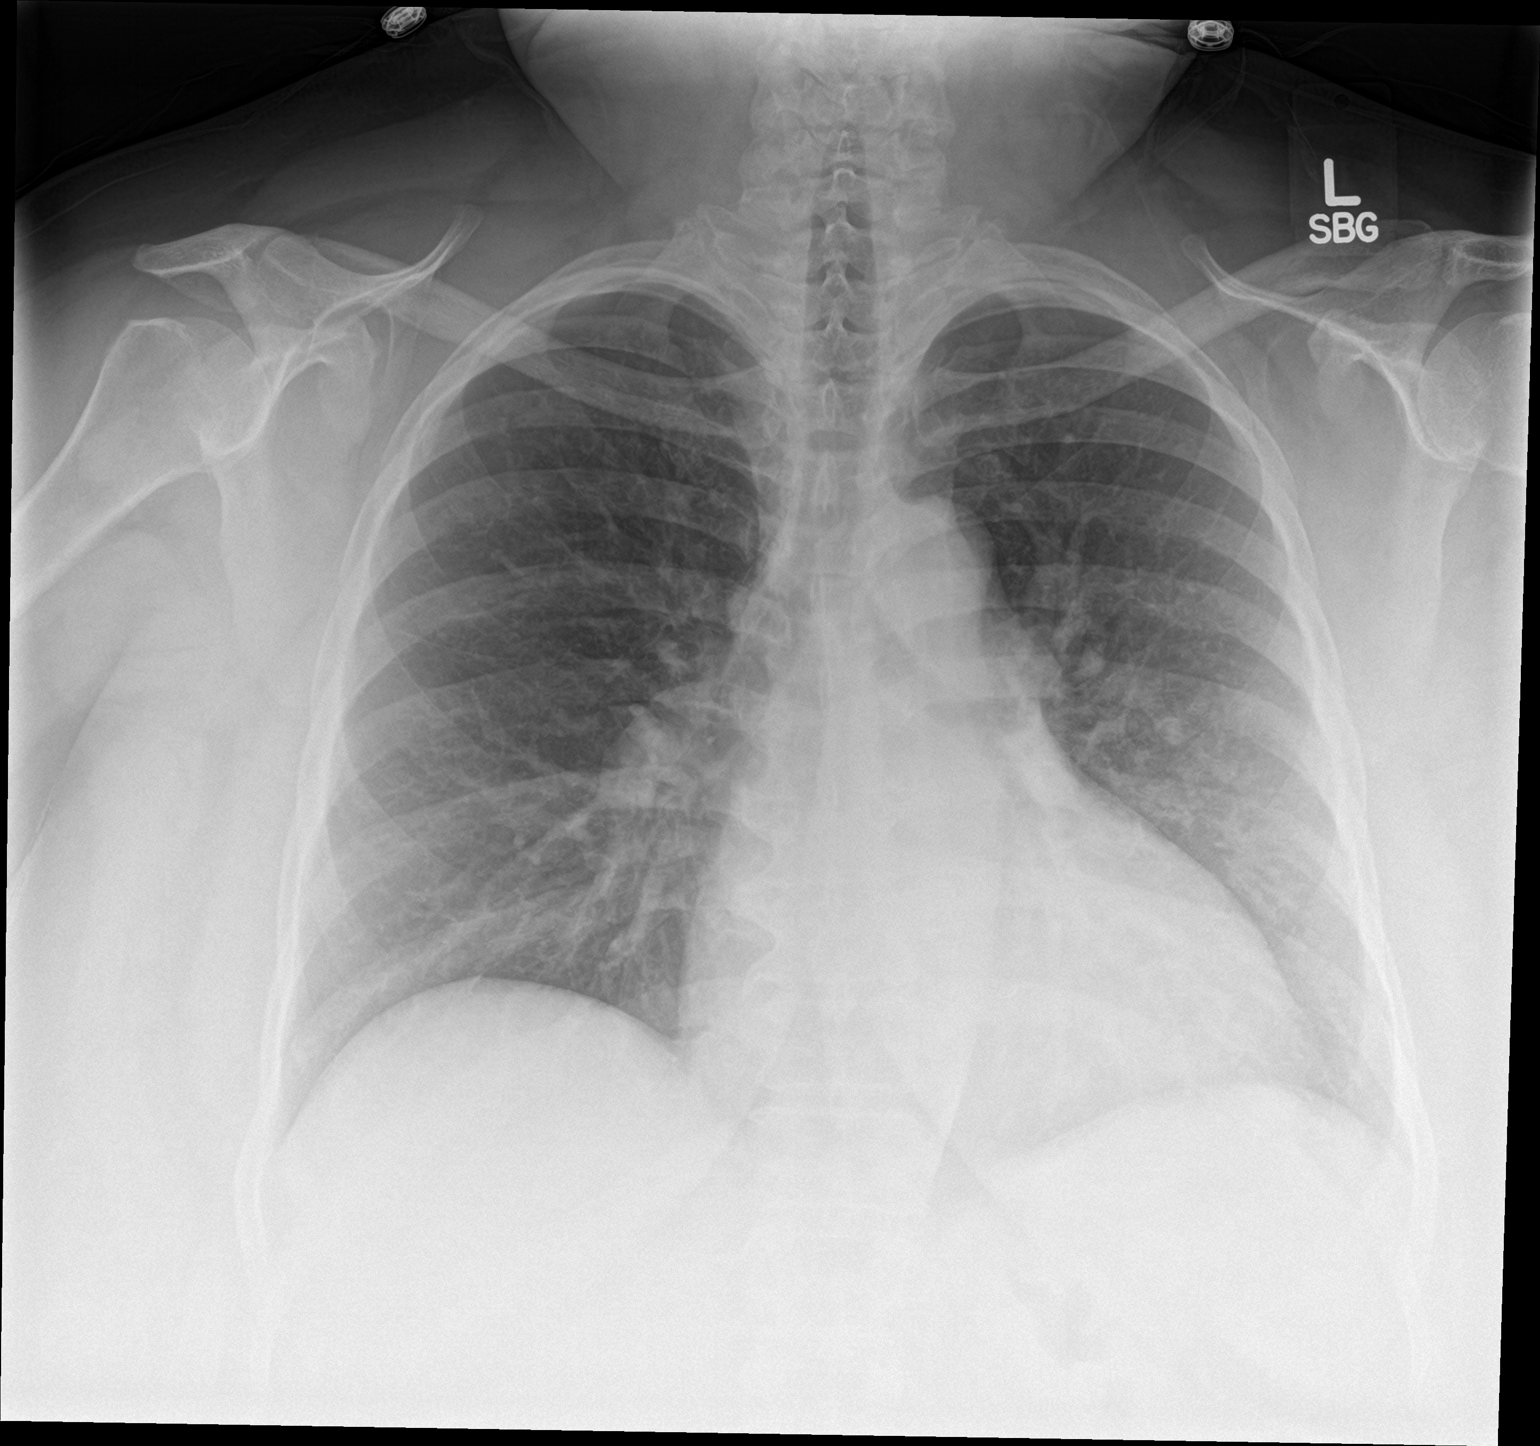

[chest lat]
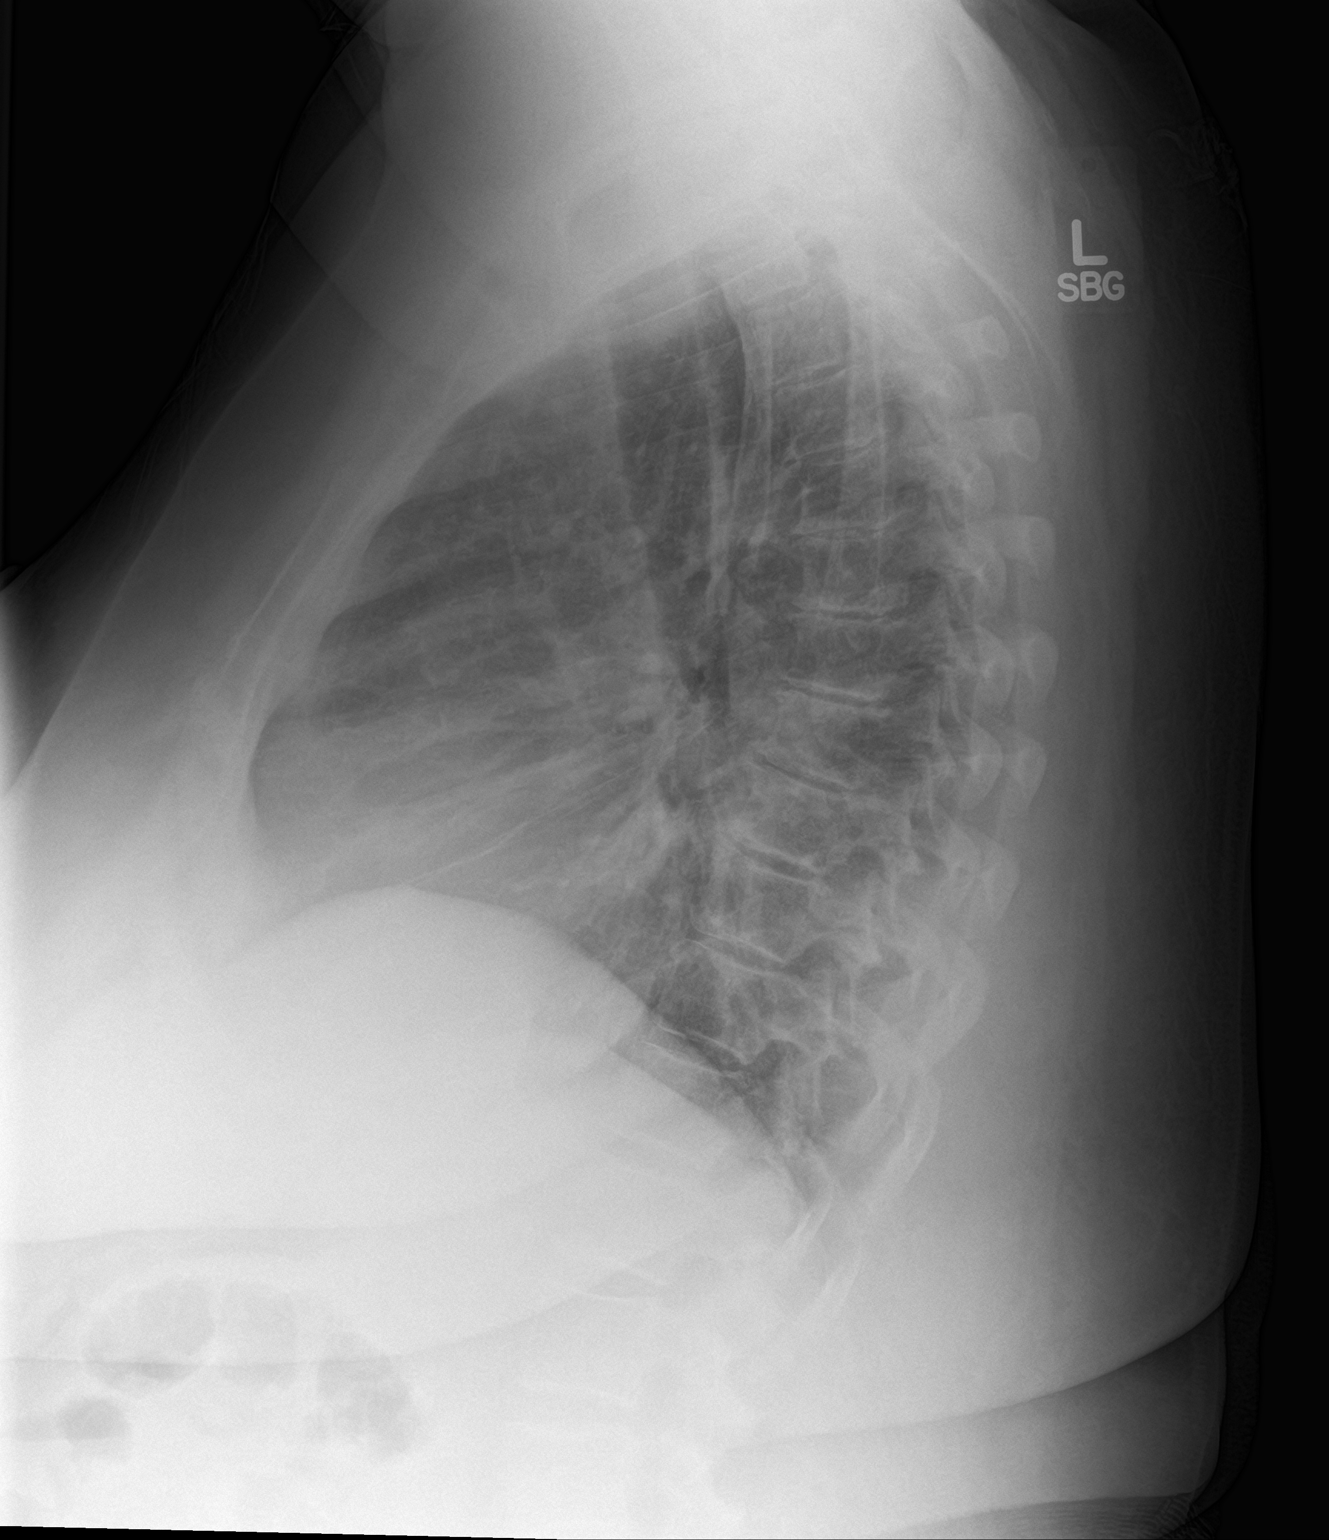

[2 of 2 positions shown; findings below may reference images not displayed]

FINDINGS: The heart size and mediastinal contours are within normal limits.
Both lungs are clear. The visualized skeletal structures are
unremarkable.
IMPRESSION: No active cardiopulmonary disease.

## 2018-01-28 ENCOUNTER — Ambulatory Visit (INDEPENDENT_AMBULATORY_CARE_PROVIDER_SITE_OTHER): Payer: Medicare HMO | Admitting: Emergency Medicine

## 2018-01-28 ENCOUNTER — Ambulatory Visit: Payer: Medicare HMO | Admitting: Family Medicine

## 2018-01-28 ENCOUNTER — Encounter: Payer: Self-pay | Admitting: Emergency Medicine

## 2018-01-28 DIAGNOSIS — J9612 Chronic respiratory failure with hypercapnia: Secondary | ICD-10-CM | POA: Diagnosis not present

## 2018-01-28 DIAGNOSIS — J42 Unspecified chronic bronchitis: Secondary | ICD-10-CM

## 2018-01-28 DIAGNOSIS — J9611 Chronic respiratory failure with hypoxia: Secondary | ICD-10-CM | POA: Diagnosis not present

## 2018-01-28 DIAGNOSIS — F209 Schizophrenia, unspecified: Secondary | ICD-10-CM | POA: Diagnosis not present

## 2018-01-28 DIAGNOSIS — J449 Chronic obstructive pulmonary disease, unspecified: Secondary | ICD-10-CM | POA: Diagnosis not present

## 2018-01-28 DIAGNOSIS — I272 Pulmonary hypertension, unspecified: Secondary | ICD-10-CM | POA: Diagnosis not present

## 2018-01-28 NOTE — Patient Instructions (Signed)
Please continue to use your trilogy ventilator every night.  We will discuss with advanced home care and try to determine your current settings, adjust your pressure settings if indicated. Please continue your oxygen at all times Please continue Trelegy 1 inhalation daily.  Remember to rinse and gargle after using. Keep your albuterol available use 2 puffs up to every 4 hours if needed for shortness of breath, chest tightness, wheezing. Please discuss with family medicine the possibility of getting more home health aide assistance. Follow with Dr Lamonte Sakai in 4 months or sooner if you have any problems.

## 2018-01-28 NOTE — Assessment & Plan Note (Signed)
Please continue to use your trilogy ventilator every night.  We will discuss with advanced home care and try to determine your current settings, adjust your pressure settings if indicated. Please continue your oxygen at all times Please discuss with family medicine the possibility of getting more home health aide assistance. Follow with Dr Lamonte Sakai in 4 months or sooner if you have any problems.

## 2018-01-28 NOTE — Assessment & Plan Note (Signed)
Please continue Trelegy 1 inhalation daily.  Remember to rinse and gargle after using. Keep your albuterol available use 2 puffs up to every 4 hours if needed for shortness of breath, chest tightness, wheezing.

## 2018-01-28 NOTE — Progress Notes (Signed)
Subjective:    Patient ID: Tiffany [name], female    DOB: 08-05-1960, 58 y.o.   MRN: [id number]  HPI  ROV 10/09/17 --follow-up visit for 58 year old woman with a history of obesity, schizophrenia, tobacco use with associated COPD (asthmatic component).  She has OSA/OHS with mild secondary pulmonary hypertension (48/18, mean 29 on 06/19/2017).  She uses 6 to 8 L oxygen per minute, uses a trilogy ventilator reliably nightly.  Current bronchodilator regimen is Trelegy and albuterol which she uses approximately.  [name] on Singulair and loratadine, omeprazole 40 mg twice daily. Her functional capacity is limited, uses an Dealer wheelchair much of the time. She has fallen a few times since last time. [name] using her Trilogy at night, but notes that she is getting less sleep overall. She never heard anything about the Boost program. [name] affiliated with palliative care, has been visited by Jannett Celestine, NP. She remains on 6-8L/min. Remains remains on Trelegy, feels that she benefits. Offered the flu shot today - she deferred.   ROV 2/77/82 --complicated case due to overlapping issues and difficulty with compliance.  [name] 66, has obesity, schizophrenia, COPD with a positive bronchodilator response.  She has OSA/OHS with mild associated pulmonary hypertension.  She has resultant daytime hypoxemia and requires a trilogy ventilator nightly.  She was last seen here in November after hospitalization, has sustained multiple falls - ? Medication effect, oversedation and required intubation in early Nov. She reports multiple problems today. She has had been having falls, has unsteadiness and dyspnea when she ambulates. [name] on Trelegy, feels that it helps her. Uses albuterol rarely. She reports good compliance with her Trilogy, wears it reliably - she is concerned that she may not be receiving high enough pressures.    Review of Systems  Constitutional: Positive for unexpected weight change. Negative for fever.    HENT: Positive for dental problem and sinus pressure. Negative for congestion, ear pain, nosebleeds, postnasal drip, rhinorrhea, sneezing, sore throat and trouble swallowing.   Eyes: Negative.  Negative for redness and itching.  Respiratory: Positive for cough, shortness of breath and wheezing. Negative for chest tightness.   Cardiovascular: Positive for leg swelling. Negative for palpitations.  Gastrointestinal: Negative.  Negative for nausea and vomiting.  Endocrine: Negative.   Genitourinary: Negative.  Negative for dysuria.  Musculoskeletal: Positive for joint swelling.  Skin: Negative.  Negative for rash.  Allergic/Immunologic: Negative.   Neurological: Negative.  Negative for headaches.  Hematological: Bruises/bleeds easily.  Psychiatric/Behavioral: Negative.  Negative for dysphoric mood. The patient is not nervous/anxious.       Objective:   Physical Exam Vitals:   01/28/18 1604  BP: 120/82  Pulse: 85  SpO2: 100%   Gen: Pleasant, overwt, in no distress,  normal affect, in wheelchair  ENT: No lesions,  mouth clear,  oropharynx clear, no postnasal drip  Neck: No JVD, no stridor  Lungs: No use of accessory muscles, clear without rales or rhonchi  Cardiovascular: RRR, heart sounds normal, no murmur or gallops, no peripheral edema  Musculoskeletal: No deformities, no cyanosis or clubbing  Neuro: alert, a bit slow to respond, moves her UE, weak B LE's  Skin: Warm, no lesions or rashes       Assessment & Plan:  Chronic respiratory failure with hypoxia and hypercapnia (HCC) Please continue to use your trilogy ventilator every night.  We will discuss with [facility] home care and try to determine your current settings, adjust your pressure settings if indicated.  Please continue your oxygen at all times Please discuss with family medicine the possibility of getting more home health aide assistance. Follow with Dr Lamonte Sakai in 4 months or sooner if you have any  problems.  COPD (chronic obstructive pulmonary disease) ([place]) Please continue Trelegy 1 inhalation daily.  Remember to rinse and gargle after using. Keep your albuterol available use 2 puffs up to every 4 hours if needed for shortness of breath, chest tightness, wheezing.  Baltazar Apo, MD, PhD 01/28/2018, 4:24 PM [facility] [phone] or if no answer [phone]

## 2018-01-29 ENCOUNTER — Telehealth: Payer: Self-pay | Admitting: Family Medicine

## 2018-01-29 NOTE — Telephone Encounter (Signed)
Pt said her case manager told her she needs more home health hours so her aide can come see her more often. An order/prior approval would have to be put in for this. Pt states her reference # is [id number] and she didn't know what that was for or if [facility] would need it. She uses [facility] and their phone number is [phone]. Her aide is coming out again tomorrow between 12-1 and pt says this needs to be done before then. Please call pt back to discuss this.

## 2018-01-29 NOTE — Telephone Encounter (Signed)
The seemingly endless stream of "I need.." phone calls continues.  Please see my telephone note of 01/23/18/  Patient had the exact same request with her previous home health agency.  I filled out the form, made several revisions and had considerable staff time only to find out that the patient is receiving maximum PCS services as allowed by the program.  I will not start that process over unless there is clear evidence of the chance of success.

## 2018-01-30 ENCOUNTER — Telehealth: Payer: Self-pay | Admitting: Family Medicine

## 2018-01-30 DIAGNOSIS — R0789 Other chest pain: Secondary | ICD-10-CM

## 2018-01-30 NOTE — Telephone Encounter (Signed)
Pt called requesting refill on Lidocane. Stated that she doesn't like the cheap ones. PT also called and asking about the Home Health again. ad

## 2018-01-30 NOTE — Telephone Encounter (Signed)
Spoke with Kathreen Devoid @ [facility].   Pt is not a current patient with them , but has been calling them several times a week.  Darlene states that she explained to Ms.Walling that a PCS form would need to be completed.   I explained to Carlyon Shadow (so that we are on the same page) that we have already attempted to get her more time but this was denied by [facility].  Darlene was appreciative for info.  Fredi Geiler, [name], CMA

## 2018-01-30 NOTE — Telephone Encounter (Signed)
Noted and agree.  It is reassuring to me that we are not the only providers who are being repeatedly asked for additional services that are of marginal benefit or for which the patient does not qualify.  We will continue to see in our practice and try to meet the patients medical needs (as opposed to her wants.)

## 2018-01-31 ENCOUNTER — Ambulatory Visit (HOSPITAL_COMMUNITY)
Admission: EM | Admit: 2018-01-31 | Discharge: 2018-01-31 | Disposition: A | Discharge status: Home / Self Care | Payer: Medicare HMO | Attending: Family Medicine | Admitting: Family Medicine

## 2018-01-31 ENCOUNTER — Telehealth (HOSPITAL_COMMUNITY): Payer: Self-pay | Admitting: Emergency Medicine

## 2018-01-31 ENCOUNTER — Encounter (HOSPITAL_COMMUNITY): Payer: Self-pay

## 2018-01-31 DIAGNOSIS — R0781 Pleurodynia: Secondary | ICD-10-CM

## 2018-01-31 DIAGNOSIS — N760 Acute vaginitis: Secondary | ICD-10-CM | POA: Insufficient documentation

## 2018-01-31 MED ORDER — LIDOCAINE 5 % EX PTCH: 1.0000 | MEDICATED_PATCH | CUTANEOUS | 0 refills | Status: DC

## 2018-01-31 MED ORDER — CLINDAMYCIN HCL 150 MG PO CAPS: 150.0000 mg | ORAL_CAPSULE | Freq: Three times a day (TID) | ORAL | 0 refills | Status: DC

## 2018-01-31 MED ORDER — LIDOCAINE 5 % EX PTCH: 1.0000 | MEDICATED_PATCH | CUTANEOUS | 6 refills | Status: DC
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
## 2018-01-31 NOTE — Telephone Encounter (Signed)
Called and verified pharmacy. Rx sent. Told that I cannot get any more pcs.

## 2018-01-31 NOTE — ED Triage Notes (Signed)
Pt presents with complaints of previous fractured ribs and complaints of continued ribcage pain and cold chills. Pt also complaints of bloody vaginal discharge.

## 2018-01-31 NOTE — Discharge Instructions (Addendum)
The Lidoderm patches are refilled. You may use 1 every 12 hours on the painful ribs You may discontinue the metronidazole antibiotic Take the clindamycin 1 pill 3 times a day in its place Continue to drink plenty of fluids and water I wrote you a note to excuse you from activities today Follow-up with your primary care doctor

## 2018-01-31 NOTE — Telephone Encounter (Signed)
Patient requested that her medications be resent to the [facility] on [address].

## 2018-02-01 ENCOUNTER — Other Ambulatory Visit: Payer: Medicare HMO | Admitting: Internal Medicine

## 2018-02-04 DIAGNOSIS — N898 Other specified noninflammatory disorders of vagina: Secondary | ICD-10-CM | POA: Diagnosis not present

## 2018-02-07 ENCOUNTER — Other Ambulatory Visit: Payer: Self-pay

## 2018-02-07 ENCOUNTER — Encounter: Payer: Self-pay | Admitting: Family Medicine

## 2018-02-07 ENCOUNTER — Ambulatory Visit (INDEPENDENT_AMBULATORY_CARE_PROVIDER_SITE_OTHER): Payer: Medicare HMO | Admitting: Family Medicine

## 2018-02-07 DIAGNOSIS — R296 Repeated falls: Secondary | ICD-10-CM

## 2018-02-07 DIAGNOSIS — R0789 Other chest pain: Secondary | ICD-10-CM | POA: Diagnosis not present

## 2018-02-07 DIAGNOSIS — F3164 Bipolar disorder, current episode mixed, severe, with psychotic features: Secondary | ICD-10-CM

## 2018-02-07 DIAGNOSIS — G931 Anoxic brain damage, not elsewhere classified: Secondary | ICD-10-CM | POA: Diagnosis not present

## 2018-02-07 DIAGNOSIS — N898 Other specified noninflammatory disorders of vagina: Secondary | ICD-10-CM | POA: Diagnosis not present

## 2018-02-07 DIAGNOSIS — R479 Unspecified speech disturbances: Secondary | ICD-10-CM

## 2018-02-07 MED ORDER — ZINC OXIDE 20 % EX OINT: 1.0000 "application " | TOPICAL_OINTMENT | CUTANEOUS | 5 refills | Status: DC | PRN

## 2018-02-07 NOTE — Assessment & Plan Note (Signed)
Ordered PT again

## 2018-02-07 NOTE — Assessment & Plan Note (Signed)
She has chronic serious mental illness and warrants psychiatric care.  If I prescribe meds, I will DC vyvanse and markedly decrease her benzos.

## 2018-02-07 NOTE — Assessment & Plan Note (Signed)
I believe she has anoxic brain injury from cardiac arrest on top of her previous bipolar disorder.

## 2018-02-07 NOTE — Patient Instructions (Signed)
Please see me in one month. Please establish with a new psychiatrist.  I will not prescribe the vyvance or the valium I sent in a presription for zinc oxide. Someone should call about speech therapy and physical therapy. Ask advanced about a portable oxygen concentrator now that you are on less oxygen 4 liters/min

## 2018-02-07 NOTE — Assessment & Plan Note (Signed)
Ordered speech therapy.  Etiology is combo of drugs and brain injury.

## 2018-02-07 NOTE — Assessment & Plan Note (Signed)
Continued rib pain beyond expected duration.  Xray suggests new rib fx.

## 2018-02-07 NOTE — Assessment & Plan Note (Signed)
Xinc oxide ordered by another physician is not 40%.  At patient request, sent Rx for 20% zinc oxide.

## 2018-02-07 NOTE — Progress Notes (Signed)
Established Patient Office Visit  Subjective:  Patient ID: Tiffany [name], female    DOB: 05/10/1960  Age: 58 y.o. MRN: [id number]  CC:  Chief Complaint  Patient presents with  . Chest Pain    HPI Tiffany [name] presents for multiple problems. 1.  Continues to complain of severe rib pain.  Her original injury was late November.  She states she had multiple falls.  Last CXR showed multiple old fractures plus one new or subacute fracture. 2. Psych.  I have long believed that she is on two much medicine by her psychiatrist.  I disagree with the combination of valium and vyvanse.  Today, she asked me for a script for vyvancse saying her psychiatrist is no longer in the community.  I declined and she was upset.  If I am to prescribe her psych meds, I will DC her vyvance and steadily decrease her valium.  She does not like this approach and will seek a new psychiatrist. 3. I had previously put in an order for speech therapy for slurred speech.   No one has contacted her. 4. I had previously put in an order for PT for her frequent falls.  Per patient, no one has contacted her.    Past Medical History:  Diagnosis Date  . Anxiety   . Arthritis    "all over" (04/10/2016)  . Asthma   . Cardiac arrest ([place]) 09/08/2016   PEA  . Carotid artery stenosis    1-39% bilateral by dopplers 11/2016  . Chronic bronchitis ([place])   . Chronic diastolic (congestive) heart failure ([place])   . Chronic kidney disease    "I see a kidney dr." (04/10/2016)  . Cocaine abuse ([place])   . Complication of anesthesia    decreased bp, decreased heart rate  . Depression   . Disorder of nervous system   . Emphysema   . GERD (gastroesophageal reflux disease)   . Heart attack ([place]) 1980s  . History of blood transfusion 1994   "couldn't stop bleeding from my period"  . Hyperlipidemia LDL goal <70   . Hypertension   . Incontinence   . Manic depression ([place])   . On home oxygen therapy    "6L; 24/7" (04/10/2016)  . OSA  on CPAP    "wear mask sometimes" (04/10/2016)  . Paranoid ([place])    "sometimes; I'm on RX for it" (04/10/2016)  . Pneumonia    "I've had it several times; haven't had it since 06/2015" (04/10/2016)  . Schizophrenia ([place])   . Seasonal allergies   . Seizures ([place])    "don't know what kind; last one was ~ 1 yr ago" (04/10/2016)  . Sinus trouble   . Stroke [facility]) 1980s   denies residual on 04/10/2016  . Type II diabetes mellitus ([place])     Past Surgical History:  Procedure Laterality Date  . CESAREAN SECTION  1997  . HERNIA REPAIR    . IR CHOLANGIOGRAM EXISTING TUBE  07/20/2016  . IR PERC CHOLECYSTOSTOMY  05/10/2016  . IR RADIOLOGIST EVAL & MGMT  06/08/2016  . IR RADIOLOGIST EVAL & MGMT  06/29/2016  . IR SINUS/FIST TUBE CHK-NON GI  07/12/2016  . RIGHT/LEFT HEART CATH AND CORONARY ANGIOGRAPHY N/A 06/19/2017   Procedure: RIGHT/LEFT HEART CATH AND CORONARY ANGIOGRAPHY;  Surgeon: Jolaine Artist, MD;  Location: [facility];  Service: Cardiovascular;  Laterality: N/A;  . TIBIA IM NAIL INSERTION Right 07/12/2016   Procedure: INTRAMEDULLARY (IM) NAIL RIGHT TIBIA;  Surgeon: Leandrew Koyanagi, MD;  Location: [facility];  Service: Orthopedics;  Laterality: Right;  . UMBILICAL HERNIA REPAIR  ~ 1963   "that's why I don't have a belly button"  . VAGINAL HYSTERECTOMY      Family History  Problem Relation Age of Onset  . Cancer Father        prostate  . Cancer Mother        lung  . Depression Mother   . Depression Sister   . Anxiety disorder Sister   . Schizophrenia Sister   . Bipolar disorder Sister   . Depression Sister   . Depression Brother   . Heart failure Other        cousin    Social History   Socioeconomic History  . Marital status: Widowed    Spouse name: Not on file  . Number of children: 3  . Years of education: Not on file  . Highest education level: Not on file  Occupational History  . 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 Financial resource  strain: Not on file  . Food insecurity:    Worry: Not on file    Inability: Not on file  . Transportation needs:    Medical: Not on file    Non-medical: Not on file  Tobacco Use  . Smoking status: Former Smoker    Packs/day: 1.50    Years: 38.00    Pack years: 57.00    Types: Cigarettes    Start date: 03/13/1977    Last attempt to quit: 04/10/2016    Years since quitting: 1.8  . Smokeless tobacco: Never Used  Substance and Sexual Activity  . Alcohol use: No    Alcohol/week: 0.0 standard drinks  . Drug use: No    Types: Cocaine    Comment: 04/10/2016 "last used cocaine back in November 2017"  . Sexual activity: Not Currently    Birth control/protection: Surgical  Lifestyle  . Physical activity:    Days per week: Not on file    Minutes per session: Not on file  . Stress: Not on file  Relationships  . Social connections:    Talks on phone: Not on file    Gets together: Not on file    Attends religious service: Not on file    Active member of club or organization: Not on file    Attends meetings of clubs or organizations: Not on file    Relationship status: Not on file  . Intimate partner violence:    Fear of current or ex partner: Not on file    Emotionally abused: Not on file    Physically abused: Not on file    Forced sexual activity: Not on file  Other Topics Concern  . Not on file  Social History Narrative   Has 1 son, Tiffany [name]   Lives with son and his boyfriend   Her house has ramps and handrails should she ever needs them.    Her mother lives down the street from her and is a good support person in addition to her son.   She drives herself, has private transportation.    Cocaine free since 02/24/16, smoke free since 04/10/16    Outpatient Medications Prior to Visit  Medication Sig Dispense Refill  . albuterol (PROVENTIL) (2.5 MG/3ML) 0.083% nebulizer solution Take 3 mLs (2.5 mg total) by nebulization every 6 (six) hours as needed for wheezing or shortness of breath.  150 mL 1  .  amLODipine (NORVASC) 5 MG tablet Take 5 mg by mouth daily.    [name] aspirin (GOODSENSE ASPIRIN LOW DOSE) 81 MG EC tablet TAKE 1 TABLET (81 MG TOTAL) BY MOUTH DAILY (MORNING). 90 tablet 3  . clindamycin (CLEOCIN) 150 MG capsule Take 1 capsule (150 mg total) by mouth 3 (three) times daily. 21 capsule 0  . colchicine (COLCRYS) 0.6 MG tablet TAKE 1 TABLET (0.6 MG TOTAL) BY MOUTH 2 (TWO) TIMES DAILY. 180 tablet 3  . diazepam (VALIUM) 10 MG tablet Take 10 mg by mouth 3 (three) times daily.  1  . diclofenac sodium (VOLTAREN) 1 % GEL Apply 2 g topically 4 (four) times daily. 100 g 0  . Febuxostat (ULORIC) 80 MG TABS Take 1 tablet (80 mg total) by mouth daily. 90 tablet 3  . ferrous sulfate (FEROSUL) 325 (65 FE) MG tablet TAKE 1 TABLET BY MOUTH DAILY.(MORNING) 90 tablet 3  . flunisolide (NASALIDE) 25 MCG/ACT (0.025%) SOLN Place 2 sprays into the nose daily. 1 Bottle 2  . Fluticasone-Umeclidin-Vilant (TRELEGY ELLIPTA) 100-62.5-25 MCG/INH AEPB Inhale 1 puff into the lungs daily. 180 each 1  . gabapentin (NEURONTIN) 600 MG tablet Take 1 tablet (600 mg total) by mouth 2 (two) times daily. 360 tablet 0  . lidocaine (LIDODERM) 5 % Place 1 patch onto the skin daily. Remove & Discard patch within 12 hours or as directed by MD 30 patch 0  . lisdexamfetamine (VYVANSE) 70 MG capsule Take 70 mg by mouth daily.     . metolazone (ZAROXOLYN) 2.5 MG tablet TAKE 1 TABLET BY MOUTH EVERY MONDAY, WEDNESDAY, AND FRIDAY. TAKE 30 MINS PRIOR TO TAKING TORSEMIDE 45 tablet 0  . montelukast (SINGULAIR) 10 MG tablet Take 1 tablet (10 mg total) by mouth at bedtime. 90 tablet 3  . Multiple Vitamin (MULTIVITAMIN WITH MINERALS) TABS tablet Take 1 tablet by mouth daily.    [name] omeprazole (PRILOSEC) 40 MG capsule Take 1 capsule (40 mg total) by mouth 2 (two) times daily. 180 capsule 3  . oxybutynin (DITROPAN-XL) 10 MG 24 hr tablet TAKE 1 TABLET BY MOUTH EVERYDAY AT BEDTIME 90 tablet 3  . potassium chloride (KLOR-CON M10) 10 MEQ  tablet Take 4 tablets (40 mEq total) by mouth daily. (Patient taking differently: Take 30 mEq by mouth 2 (two) times daily. ) 90 tablet 0  . SAPHRIS 2.5 MG SUBL Take 2.5 mg by mouth 2 (two) times daily.    [name] torsemide (DEMADEX) 20 MG tablet Take 4 tablets (80 mg total) by mouth 2 (two) times daily. 240 tablet 4  . traZODone (DESYREL) 100 MG tablet 50 mg in the AM and 150 mg at night (Patient taking differently: Take 100 mg by mouth at bedtime. 50 mg in the AM and 150 mg at night)    . Zinc Oxide (DESITIN) 40 % PSTE Apply 1 application topically 2 (two) times daily as needed. Apply to areas that are itching 113 g 1   No facility-administered medications prior to visit.     Allergies  Allergen Reactions  . Hydrocodone-Acetaminophen Shortness Of Breath  . Hydroxyzine Anaphylaxis and Shortness Of Breath  . Latuda [Lurasidone Hcl] Anaphylaxis  . Magnesium-Containing Compounds Anaphylaxis  . Prednisone Anaphylaxis, Swelling and Other (See Comments)    Tongue swelling  . Tramadol Anaphylaxis  . Codeine Nausea And Vomiting  . Sulfa Antibiotics Itching  . Tape Rash    ROS Review of Systems    Objective:    Physical Exam  BP 102/80   Pulse  81   Temp (!) 97.5 F (36.4 C) (Oral)   Ht 5\' 4"  (1.626 m)   Wt 180 lb 9.6 oz (81.9 kg)   SpO2 97%   BMI 31.00 kg/m  Wt Readings from Last 3 Encounters:  02/07/18 180 lb 9.6 oz (81.9 kg)  01/21/18 194 lb 6.4 oz (88.2 kg)  12/31/17 194 lb 6.4 oz (88.2 kg)   Chronic slurred speech which sounds much worse than a Carey drawl.  I believe it is a combination of her anoxic brain injury from a previous cardiac arrest and her overmedication.    Lungs clear Left breast, skin lesions are healing nicely.  She complains of exquisite pain.  There are no preventive care reminders to display for this patient.  There are no preventive care reminders to display for this patient.  Lab Results  Component Value Date   TSH 0.563 03/15/2016   Lab  Results  Component Value Date   WBC 11.1 (H) 12/25/2017   HGB 9.0 (L) 12/25/2017   HCT 30.2 (L) 12/25/2017   MCV 94.4 12/25/2017   PLT 282 12/25/2017   Lab Results  Component Value Date   NA 140 12/25/2017   K 3.8 12/25/2017   CO2 25 12/25/2017   GLUCOSE 81 12/25/2017   BUN 26 (H) 12/25/2017   CREATININE 2.19 (H) 12/25/2017   BILITOT 0.4 12/25/2017   ALKPHOS 186 (H) 12/25/2017   AST 28 12/25/2017   ALT 23 12/25/2017   PROT 6.2 (L) 12/25/2017   ALBUMIN 3.0 (L) 12/25/2017   CALCIUM 8.8 (L) 12/25/2017   ANIONGAP 11 12/25/2017   Lab Results  Component Value Date   CHOL 129 01/07/2017   Lab Results  Component Value Date   HDL 35 (L) 01/07/2017   Lab Results  Component Value Date   LDLCALC 56 01/07/2017   Lab Results  Component Value Date   TRIG 121 11/21/2017   Lab Results  Component Value Date   CHOLHDL 3.7 01/07/2017   Lab Results  Component Value Date   HGBA1C 5.3 10/11/2017      Assessment & Plan:   Problem List Items Addressed This Visit    Frequent falls   Relevant Orders   Ambulatory referral to [facility] ordered this encounter  Medications  . DISCONTD: zinc oxide 20 % ointment    Sig: Apply 1 application topically as needed for irritation.    Dispense:  56.7 g    Refill:  5  . zinc oxide 20 % ointment    Sig: Apply 1 application topically as needed for irritation.    Dispense:  56.7 g    Refill:  5    Follow-up: No follow-ups on file.    Zenia Resides, MD

## 2018-02-08 ENCOUNTER — Ambulatory Visit (HOSPITAL_COMMUNITY)
Admission: EM | Admit: 2018-02-08 | Discharge: 2018-02-08 | Disposition: A | Discharge status: Home / Self Care | Payer: Medicare HMO | Attending: Emergency Medicine | Admitting: Emergency Medicine

## 2018-02-08 DIAGNOSIS — R0781 Pleurodynia: Secondary | ICD-10-CM | POA: Insufficient documentation

## 2018-02-08 DIAGNOSIS — N898 Other specified noninflammatory disorders of vagina: Secondary | ICD-10-CM | POA: Insufficient documentation

## 2018-02-08 LAB — POCT URINALYSIS DIP (DEVICE)
Bilirubin Urine: NEGATIVE
Glucose, UA: NEGATIVE mg/dL
Ketones, ur: NEGATIVE mg/dL
Nitrite: POSITIVE — AB
Protein, ur: 30 mg/dL — AB
Specific Gravity, Urine: 1.02 (ref 1.005–1.030)
Urobilinogen, UA: 0.2 mg/dL (ref 0.0–1.0)
pH: 6 (ref 5.0–8.0)

## 2018-02-08 MED ORDER — LIDOCAINE 5 % EX PTCH: 1.0000 | MEDICATED_PATCH | CUTANEOUS | 0 refills | Status: DC

## 2018-02-08 MED ORDER — NITROFURANTOIN MONOHYD MACRO 100 MG PO CAPS: 100.0000 mg | ORAL_CAPSULE | Freq: Two times a day (BID) | ORAL | 0 refills | Status: AC

## 2018-02-08 MED ORDER — FLUCONAZOLE 150 MG PO TABS: 150.0000 mg | ORAL_TABLET | Freq: Once | ORAL | 0 refills | Status: AC
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
## 2018-02-08 NOTE — Discharge Instructions (Signed)
Please continue to allow rib fractures another 2-3 weeks to heal Continue to use tylenol, lidocaine patches for pain  May try taking diflucan once to help with itching

## 2018-02-09 NOTE — ED Provider Notes (Signed)
[facility]    CSN: [id number] Arrival date & time: 02/08/18  1651     History   Chief Complaint Chief Complaint  Patient presents with  . Vaginal Itching  . Rib Injury    HPI Tiffany [name] is a 58 y.o. female history of CKD stage IV, substance abuse, carotid artery stenosis, schizophrenia, previous CVA, COPD, amongst other medical history listed below.  Patient is presenting today for evaluation of rib pain, vaginal itching and dysuria.  Patient states that she has continued pain in her left side.  This is in the same as when she has been seen previously.  She states that she feels like the pain was improving, but with using that arm she feels it worsens her pain.  [name] to continue to use Tylenol and lidocaine patches.  [name] almost out of lidocaine patches.  She does admit to continued falls, but denies any new pain.  Denies shortness of breath.  Denies fevers or coughs.  Patient is also concerned that she has had continued vaginal itching.  Patient previously tested positive for bacterial vaginosis and was started on metronidazole.  Patient did not tolerate this medicine was switched to clindamycin.  She continues to have discharge.  She also notes that she has had increased frequency and incomplete voiding with dysuria with urination.  This is been going on for the past few days.  HPI  Past Medical History:  Diagnosis Date  . Anxiety   . Arthritis    "all over" (04/10/2016)  . Asthma   . Cardiac arrest ([place]) 09/08/2016   PEA  . Carotid artery stenosis    1-39% bilateral by dopplers 11/2016  . Chronic bronchitis ([place])   . Chronic diastolic (congestive) heart failure ([place])   . Chronic kidney disease    "I see a kidney dr." (04/10/2016)  . Cocaine abuse ([place])   . Complication of anesthesia    decreased bp, decreased heart rate  . Depression   . Disorder of nervous system   . Emphysema   . GERD (gastroesophageal reflux disease)   . Heart attack ([place]) 1980s   . History of blood transfusion 1994   "couldn't stop bleeding from my period"  . Hyperlipidemia LDL goal <70   . Hypertension   . Incontinence   . Manic depression ([place])   . On home oxygen therapy    "6L; 24/7" (04/10/2016)  . OSA on CPAP    "wear mask sometimes" (04/10/2016)  . Paranoid ([place])    "sometimes; I'm on RX for it" (04/10/2016)  . Pneumonia    "I've had it several times; haven't had it since 06/2015" (04/10/2016)  . Schizophrenia ([place])   . Seasonal allergies   . Seizures ([employer])    "don't know what kind; last one was ~ 1 yr ago" (04/10/2016)  . Sinus trouble   . Stroke [facility]) 1980s   denies residual on 04/10/2016  . Type II diabetes mellitus [facility])     Patient Active Problem List   Diagnosis Date Noted  . Difficulty with speech 01/24/2018  . Vaginal discharge 01/03/2018  . Left-sided chest wall pain 12/06/2017  . AKI (acute kidney injury) ([place]) 11/21/2017  . Encephalopathy 11/21/2017  . Drug abuse ([place]) 11/21/2017  . Frequent falls 10/11/2017  . Dependence on continuous supplemental oxygen 05/14/2017  . Gout 04/11/2017  . Palliative care encounter 03/12/2017  . CKD (chronic kidney disease) stage 3, GFR 30-59 ml/min (HCC) 12/15/2016  . Carotid artery stenosis   .  Osteoarthritis 10/26/2016  . Anoxic brain injury ([place]) 09/08/2016  . Overactive bladder 06/07/2016  . QT prolongation   . OSA and COPD overlap syndrome ([place])   . Arthritis   . Essential hypertension 03/22/2016  . Bipolar affective disorder, mixed, severe, with psychotic behavior ([place]) 11/28/2015  . History of drug abuse in remission ([place]) 11/28/2015  . Chronic diastolic congestive heart failure ([place])   . Chronic respiratory failure with hypoxia and hypercapnia ([place]) 06/22/2015  . Tobacco use disorder 07/22/2014  . COPD (chronic obstructive pulmonary disease) ([place]) 07/08/2014  . Seizure ([place]) 01/04/2013  . Chronic pain syndrome 06/18/2012  . Dyslipidemia 04/24/2011  . Anemia 04/24/2011  . Diabetic  neuropathy ([place]) 04/24/2011  . Obstructive sleep apnea 10/18/2010  . Asthma 10/18/2010  . Morbid obesity ([place]) 10/18/2010  . Type 2 diabetes mellitus ([place]) 10/18/2010    Past Surgical History:  Procedure Laterality Date  . CESAREAN SECTION  1997  . HERNIA REPAIR    . IR CHOLANGIOGRAM EXISTING TUBE  07/20/2016  . IR PERC CHOLECYSTOSTOMY  05/10/2016  . IR RADIOLOGIST EVAL & MGMT  06/08/2016  . IR RADIOLOGIST EVAL & MGMT  06/29/2016  . IR SINUS/FIST TUBE CHK-NON GI  07/12/2016  . RIGHT/LEFT HEART CATH AND CORONARY ANGIOGRAPHY N/A 06/19/2017   Procedure: RIGHT/LEFT HEART CATH AND CORONARY ANGIOGRAPHY;  Surgeon: Jolaine Artist, MD;  Location: [facility];  Service: Cardiovascular;  Laterality: N/A;  . TIBIA IM NAIL INSERTION Right 07/12/2016   Procedure: INTRAMEDULLARY (IM) NAIL RIGHT TIBIA;  Surgeon: Leandrew Koyanagi, MD;  Location: [facility];  Service: Orthopedics;  Laterality: Right;  . UMBILICAL HERNIA REPAIR  ~ 1963   "that's why I don't have a belly button"  . VAGINAL HYSTERECTOMY      OB History   No obstetric history on file.      Home Medications    Prior to Admission medications   Medication Sig Start Date End Date Taking? Authorizing Provider  albuterol (PROVENTIL) (2.5 MG/3ML) 0.083% nebulizer solution Take 3 mLs (2.5 mg total) by nebulization every 6 (six) hours as needed for wheezing or shortness of breath. 06/06/17   Mikell, Jeani Sow, MD  amLODipine (NORVASC) 5 MG tablet Take 5 mg by mouth daily.    [provider]  aspirin (GOODSENSE ASPIRIN LOW DOSE) 81 MG EC tablet TAKE 1 TABLET (81 MG TOTAL) BY MOUTH DAILY (MORNING). 11/12/17   Zenia Resides, MD  clindamycin (CLEOCIN) 150 MG capsule Take 1 capsule (150 mg total) by mouth 3 (three) times daily. 01/31/18   Raylene Everts, MD  colchicine (COLCRYS) 0.6 MG tablet TAKE 1 TABLET (0.6 MG TOTAL) BY MOUTH 2 (TWO) TIMES DAILY. 10/17/17   Zenia Resides, MD  diazepam (VALIUM) 10 MG tablet Take 10 mg by mouth  3 (three) times daily. 11/21/17   [provider]  diclofenac sodium (VOLTAREN) 1 % GEL Apply 2 g topically 4 (four) times daily. 01/21/18   ,  C, PA-C  Febuxostat (ULORIC) 80 MG TABS Take 1 tablet (80 mg total) by mouth daily. 11/12/17   Zenia Resides, MD  ferrous sulfate (FEROSUL) 325 (65 FE) MG tablet TAKE 1 TABLET BY MOUTH DAILY.(MORNING) 11/12/17   Zenia Resides, MD  flunisolide (NASALIDE) 25 MCG/ACT (0.025%) SOLN Place 2 sprays into the nose daily. 06/26/17   McDiarmid, Blane Ohara, MD  Fluticasone-Umeclidin-Vilant (TRELEGY ELLIPTA) 100-62.5-25 MCG/INH AEPB Inhale 1 puff into the lungs daily. 07/26/17   Collene Gobble, MD  gabapentin (  NEURONTIN) 600 MG tablet Take 1 tablet (600 mg total) by mouth 2 (two) times daily. 11/23/17   Sela Hilding, MD  lidocaine (LIDODERM) 5 % Place 1 patch onto the skin daily. Remove & Discard patch within 12 hours or as directed by MD 02/08/18   ,  C, PA-C  lisdexamfetamine (VYVANSE) 70 MG capsule Take 70 mg by mouth daily.     [provider]  metolazone (ZAROXOLYN) 2.5 MG tablet TAKE 1 TABLET BY MOUTH EVERY MONDAY, [place], AND FRIDAY. TAKE 30 MINS PRIOR TO TAKING TORSEMIDE 01/24/18   Lyda Jester M, PA-C  montelukast (SINGULAIR) 10 MG tablet Take 1 tablet (10 mg total) by mouth at bedtime. 08/29/17   Zenia Resides, MD  Multiple Vitamin (MULTIVITAMIN WITH MINERALS) TABS tablet Take 1 tablet by mouth daily.    [provider]  nitrofurantoin, macrocrystal-monohydrate, (MACROBID) 100 MG capsule Take 1 capsule (100 mg total) by mouth 2 (two) times daily for 5 days. 02/08/18 02/13/18  ,  C, PA-C  omeprazole (PRILOSEC) 40 MG capsule Take 1 capsule (40 mg total) by mouth 2 (two) times daily. 10/17/17   Zenia Resides, MD  oxybutynin (DITROPAN-XL) 10 MG 24 hr tablet TAKE 1 TABLET BY MOUTH EVERYDAY AT BEDTIME 11/12/17   Hensel, Jamal Collin, MD  potassium chloride (KLOR-CON M10) 10 MEQ tablet Take  4 tablets (40 mEq total) by mouth daily. Patient taking differently: Take 30 mEq by mouth 2 (two) times daily.  11/23/17   Sela Hilding, MD  SAPHRIS 2.5 MG SUBL Take 2.5 mg by mouth 2 (two) times daily. 12/24/17   [provider]  torsemide (DEMADEX) 20 MG tablet Take 4 tablets (80 mg total) by mouth 2 (two) times daily. 06/06/17   Mikell, Jeani Sow, MD  traZODone (DESYREL) 100 MG tablet 50 mg in the AM and 150 mg at night Patient taking differently: Take 100 mg by mouth at bedtime. 50 mg in the AM and 150 mg at night 06/25/17   Mikell, Jeani Sow, MD  zinc oxide 20 % ointment Apply 1 application topically as needed for irritation. 02/07/18   Zenia Resides, MD    Family History Family History  Problem Relation Age of Onset  . Cancer Father        prostate  . Cancer Mother        lung  . Depression Mother   . Depression Sister   . Anxiety disorder Sister   . Schizophrenia Sister   . Bipolar disorder Sister   . Depression Sister   . Depression Brother   . Heart failure Other        cousin    Social History Social History   Tobacco Use  . Smoking status: Former Smoker    Packs/day: 1.50    Years: 38.00    Pack years: 57.00    Types: Cigarettes    Start date: 03/13/1977    Last attempt to quit: 04/10/2016    Years since quitting: 1.8  . Smokeless tobacco: Never Used  Substance Use Topics  . Alcohol use: No    Alcohol/week: 0.0 standard drinks  . Drug use: No    Types: Cocaine    Comment: 04/10/2016 "last used cocaine back in November 2017"     Allergies   Hydrocodone-acetaminophen; Hydroxyzine; Latuda [lurasidone hcl]; Magnesium-containing compounds; Prednisone; Tramadol; Codeine; Sulfa antibiotics; and Tape   Review of Systems Review of Systems  Constitutional: Negative for fever.  Respiratory: Negative for shortness of breath.  Cardiovascular: Positive for chest pain.  Gastrointestinal: Negative for abdominal pain, diarrhea, nausea and  vomiting.  Genitourinary: Positive for dysuria, frequency, urgency and vaginal discharge. Negative for flank pain, genital sores, hematuria, menstrual problem, vaginal bleeding and vaginal pain.  Musculoskeletal: Negative for back pain.  Skin: Negative for rash.  Neurological: Negative for dizziness, light-headedness and headaches.     Physical Exam Triage Vital Signs ED Triage Vitals  Enc Vitals Group     BP 02/08/18 1757 118/85     Pulse Rate 02/08/18 1757 82     Resp 02/08/18 1757 20     Temp 02/08/18 1757 97.8 F (36.6 C)     Temp Source 02/08/18 1757 Oral     SpO2 02/08/18 1757 100 %     Weight --      Height --      Head Circumference --      Peak Flow --      Pain Score 02/08/18 1800 10     Pain Loc --      Pain Edu? --      Excl. in [place]? --    No data found.  Updated Vital Signs BP 118/85 (BP Location: Left Arm)   Pulse 82   Temp 97.8 F (36.6 C) (Oral)   Resp 20   SpO2 100%   Visual Acuity Right Eye Distance:   Left Eye Distance:   Bilateral Distance:    Right Eye Near:   Left Eye Near:    Bilateral Near:     Physical Exam Vitals signs and nursing note reviewed.  Constitutional:      General: [name] not in acute distress.    Appearance: [name] well-developed.  HENT:     Head: Normocephalic and atraumatic.  Eyes:     Conjunctiva/sclera: Conjunctivae normal.  Neck:     Musculoskeletal: Neck supple.  Cardiovascular:     Rate and Rhythm: Normal rate and regular rhythm.     Heart sounds: No murmur.  Pulmonary:     Effort: Pulmonary effort is normal. No respiratory distress.     Breath sounds: Normal breath sounds.     Comments: Breathing comfortably at rest, CTABL, no wheezing, rales or other adventitious sounds auscultated  Continued tenderness below left breast with palpation of rib cage, does not appear as sensitive as she previously has on previous visits and is more mobile in her trunk Abdominal:     Palpations: Abdomen is soft.      Tenderness: There is no abdominal tenderness.  Genitourinary:    Comments: Deferred Skin:    General: Skin is warm and dry.  Neurological:     Mental Status: [name] alert.      UC Treatments / Results  Labs (all labs ordered are listed, but only abnormal results are displayed) Labs Reviewed  POCT URINALYSIS DIP (DEVICE) - Abnormal; Notable for the following components:      Result Value   Hgb urine dipstick TRACE (*)    Protein, ur 30 (*)    Nitrite POSITIVE (*)    Leukocytes, UA SMALL (*)    All other components within normal limits  URINE CULTURE    EKG None  Radiology No results found.  Procedures Procedures (including critical care time)  Medications Ordered in UC Medications - No data to display  Initial Impression / Assessment and Plan / UC Course  I have reviewed the triage vital signs and the nursing notes.  Pertinent labs & imaging results  that were available during my care of the patient were reviewed by me and considered in my medical decision making (see chart for details).     Patient likely with continued healing of rib fracture noted on 01/21/2018.  Likely needs another 2 to 3 weeks for full healing.  Lungs clear at this time, patient denies any fever or cough, do not suspect underlying pneumonia.  Will refill lidocaine patches to continue to use.  Do not recommend any NSAIDs or controlled substances for her pain.  Positive nitrites and small leuks, will initiate treatment with Macrobid twice daily x5 days for UTI.  Culture sent.  We will also try treating for yeast for itching given she is recently been on antibiotics for BV.  Discussed that if her symptoms persist she should follow-up with OB/GYN for further evaluation.   Discussed strict return precautions. Patient verbalized understanding and is agreeable with plan.  Final Clinical Impressions(s) / UC Diagnoses   Final diagnoses:  Rib pain  Vaginal itching     Discharge Instructions       Please continue to allow rib fractures another 2-3 weeks to heal Continue to use tylenol, lidocaine patches for pain  May try taking diflucan once to help with itching   ED Prescriptions    Medication Sig Dispense Auth. Provider   fluconazole (DIFLUCAN) 150 MG tablet Take 1 tablet (150 mg total) by mouth once for 1 dose. 1 tablet ,  C, PA-C   lidocaine (LIDODERM) 5 % Place 1 patch onto the skin daily. Remove & Discard patch within 12 hours or as directed by MD 30 patch ,  C, PA-C   nitrofurantoin, macrocrystal-monohydrate, (MACROBID) 100 MG capsule Take 1 capsule (100 mg total) by mouth 2 (two) times daily for 5 days. 10 capsule ,  C, PA-C     Controlled Substance Prescriptions [place] Controlled Substance Registry consulted? Not Applicable   Janith Lima, [place] 02/09/18 [id number]

## 2018-02-11 ENCOUNTER — Telehealth (HOSPITAL_COMMUNITY): Payer: Self-pay | Admitting: Emergency Medicine

## 2018-02-11 LAB — URINE CULTURE: Culture: >=100000 — AB

## 2018-02-11 NOTE — Telephone Encounter (Signed)
Urine culture was positive for Escherichia coli and was given macrobid  at urgent care visit. Pt contacted and made aware, educated on completing antibiotic and to follow up if symptoms are persistent. Verbalized understanding.

## 2018-02-13 DIAGNOSIS — G4733 Obstructive sleep apnea (adult) (pediatric): Secondary | ICD-10-CM | POA: Diagnosis not present

## 2018-02-13 DIAGNOSIS — J9611 Chronic respiratory failure with hypoxia: Secondary | ICD-10-CM | POA: Diagnosis not present

## 2018-02-13 DIAGNOSIS — M6281 Muscle weakness (generalized): Secondary | ICD-10-CM | POA: Diagnosis not present

## 2018-02-13 DIAGNOSIS — R062 Wheezing: Secondary | ICD-10-CM | POA: Diagnosis not present

## 2018-02-13 DIAGNOSIS — J440 Chronic obstructive pulmonary disease with acute lower respiratory infection: Secondary | ICD-10-CM | POA: Diagnosis not present

## 2018-02-13 DIAGNOSIS — I509 Heart failure, unspecified: Secondary | ICD-10-CM | POA: Diagnosis not present

## 2018-02-13 DIAGNOSIS — J449 Chronic obstructive pulmonary disease, unspecified: Secondary | ICD-10-CM | POA: Diagnosis not present

## 2018-02-13 DIAGNOSIS — J45909 Unspecified asthma, uncomplicated: Secondary | ICD-10-CM | POA: Diagnosis not present

## 2018-02-13 DIAGNOSIS — R0602 Shortness of breath: Secondary | ICD-10-CM | POA: Diagnosis not present

## 2018-02-13 DIAGNOSIS — J189 Pneumonia, unspecified organism: Secondary | ICD-10-CM | POA: Diagnosis not present

## 2018-02-15 DIAGNOSIS — S2242XD Multiple fractures of ribs, left side, subsequent encounter for fracture with routine healing: Secondary | ICD-10-CM | POA: Diagnosis not present

## 2018-02-15 DIAGNOSIS — R4781 Slurred speech: Secondary | ICD-10-CM | POA: Diagnosis not present

## 2018-02-15 DIAGNOSIS — I130 Hypertensive heart and chronic kidney disease with heart failure and stage 1 through stage 4 chronic kidney disease, or unspecified chronic kidney disease: Secondary | ICD-10-CM | POA: Diagnosis not present

## 2018-02-15 DIAGNOSIS — I5032 Chronic diastolic (congestive) heart failure: Secondary | ICD-10-CM | POA: Diagnosis not present

## 2018-02-15 DIAGNOSIS — E1122 Type 2 diabetes mellitus with diabetic chronic kidney disease: Secondary | ICD-10-CM | POA: Diagnosis not present

## 2018-02-15 DIAGNOSIS — J449 Chronic obstructive pulmonary disease, unspecified: Secondary | ICD-10-CM | POA: Diagnosis not present

## 2018-02-15 DIAGNOSIS — G931 Anoxic brain damage, not elsewhere classified: Secondary | ICD-10-CM | POA: Diagnosis not present

## 2018-02-15 DIAGNOSIS — F3164 Bipolar disorder, current episode mixed, severe, with psychotic features: Secondary | ICD-10-CM | POA: Diagnosis not present

## 2018-02-15 DIAGNOSIS — N183 Chronic kidney disease, stage 3 (moderate): Secondary | ICD-10-CM | POA: Diagnosis not present

## 2018-02-16 ENCOUNTER — Other Ambulatory Visit: Payer: Self-pay

## 2018-02-16 ENCOUNTER — Ambulatory Visit
Admission: EM | Admit: 2018-02-16 | Discharge: 2018-02-16 | Disposition: A | Discharge status: Home / Self Care | Payer: Medicare HMO | Attending: Family Medicine | Admitting: Family Medicine

## 2018-02-16 DIAGNOSIS — N898 Other specified noninflammatory disorders of vagina: Secondary | ICD-10-CM

## 2018-02-16 DIAGNOSIS — R0789 Other chest pain: Secondary | ICD-10-CM

## 2018-02-16 MED ORDER — CLINDAMYCIN HCL 150 MG PO CAPS: 150.0000 mg | ORAL_CAPSULE | Freq: Three times a day (TID) | ORAL | 0 refills | Status: DC

## 2018-02-16 MED ORDER — LIDOCAINE 5 % EX PTCH: 1.0000 | MEDICATED_PATCH | CUTANEOUS | 0 refills | Status: DC

## 2018-02-16 MED ORDER — CLINDAMYCIN HCL 150 MG PO CAPS: 150.0000 mg | ORAL_CAPSULE | Freq: Three times a day (TID) | ORAL | 0 refills | Status: AC

## 2018-02-16 NOTE — Discharge Instructions (Signed)
Please continue to use lidocaine patches and Tylenol as needed for pain Begin clindamycin 3 times a day for the next week to help with discharge  Please follow-up with primary care next door  Please follow-up here in the emergency room if having worsening chest discomfort, shortness of breath
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
## 2018-02-17 DIAGNOSIS — J9611 Chronic respiratory failure with hypoxia: Secondary | ICD-10-CM | POA: Diagnosis not present

## 2018-02-18 ENCOUNTER — Telehealth: Payer: Self-pay | Admitting: Family Medicine

## 2018-02-18 ENCOUNTER — Telehealth: Payer: Self-pay

## 2018-02-18 NOTE — Telephone Encounter (Signed)
Herma Mering RN with [facility] is calling and would like to have verbal orders for pt.   She would like start of care for OT, PT and speech therapy.   Belenda Cruise would also like to have skilled nursing orders : 1 time a week for 1 week and 1 time a week for 4 weeks.   Medication management and fall prevention 1 x a week 4 weeks  The best call back number is [phone].

## 2018-02-18 NOTE — Telephone Encounter (Signed)
Tillie Rung, PT with [facility], called for verbal orders:  1.HH PT 1x/week x 1, 2x/week x 2, 1x/week x2 for LE strengthening  2. ST eval for slurred speech  3. OT eval for arm strengthening and bathroom safety.  Callback is [phone].  Danley Danker, RN [facility] RN)

## 2018-02-18 NOTE — Telephone Encounter (Signed)
Verbal orders already given to [facility].

## 2018-02-18 NOTE — Telephone Encounter (Signed)
Orders given as requested. 

## 2018-02-19 DIAGNOSIS — E1122 Type 2 diabetes mellitus with diabetic chronic kidney disease: Secondary | ICD-10-CM | POA: Diagnosis not present

## 2018-02-19 DIAGNOSIS — I5032 Chronic diastolic (congestive) heart failure: Secondary | ICD-10-CM | POA: Diagnosis not present

## 2018-02-19 DIAGNOSIS — N183 Chronic kidney disease, stage 3 (moderate): Secondary | ICD-10-CM | POA: Diagnosis not present

## 2018-02-19 DIAGNOSIS — R4781 Slurred speech: Secondary | ICD-10-CM | POA: Diagnosis not present

## 2018-02-19 DIAGNOSIS — J449 Chronic obstructive pulmonary disease, unspecified: Secondary | ICD-10-CM | POA: Diagnosis not present

## 2018-02-19 DIAGNOSIS — G931 Anoxic brain damage, not elsewhere classified: Secondary | ICD-10-CM | POA: Diagnosis not present

## 2018-02-19 DIAGNOSIS — S2242XD Multiple fractures of ribs, left side, subsequent encounter for fracture with routine healing: Secondary | ICD-10-CM | POA: Diagnosis not present

## 2018-02-19 DIAGNOSIS — F3164 Bipolar disorder, current episode mixed, severe, with psychotic features: Secondary | ICD-10-CM | POA: Diagnosis not present

## 2018-02-19 DIAGNOSIS — J9611 Chronic respiratory failure with hypoxia: Secondary | ICD-10-CM | POA: Diagnosis not present

## 2018-02-19 DIAGNOSIS — I130 Hypertensive heart and chronic kidney disease with heart failure and stage 1 through stage 4 chronic kidney disease, or unspecified chronic kidney disease: Secondary | ICD-10-CM | POA: Diagnosis not present

## 2018-02-20 ENCOUNTER — Telehealth: Payer: Self-pay | Admitting: Family Medicine

## 2018-02-20 DIAGNOSIS — I130 Hypertensive heart and chronic kidney disease with heart failure and stage 1 through stage 4 chronic kidney disease, or unspecified chronic kidney disease: Secondary | ICD-10-CM | POA: Diagnosis not present

## 2018-02-20 DIAGNOSIS — F3164 Bipolar disorder, current episode mixed, severe, with psychotic features: Secondary | ICD-10-CM | POA: Diagnosis not present

## 2018-02-20 DIAGNOSIS — N183 Chronic kidney disease, stage 3 (moderate): Secondary | ICD-10-CM | POA: Diagnosis not present

## 2018-02-20 DIAGNOSIS — S2242XD Multiple fractures of ribs, left side, subsequent encounter for fracture with routine healing: Secondary | ICD-10-CM | POA: Diagnosis not present

## 2018-02-20 DIAGNOSIS — R4781 Slurred speech: Secondary | ICD-10-CM | POA: Diagnosis not present

## 2018-02-20 DIAGNOSIS — I5032 Chronic diastolic (congestive) heart failure: Secondary | ICD-10-CM | POA: Diagnosis not present

## 2018-02-20 DIAGNOSIS — J449 Chronic obstructive pulmonary disease, unspecified: Secondary | ICD-10-CM | POA: Diagnosis not present

## 2018-02-20 DIAGNOSIS — G931 Anoxic brain damage, not elsewhere classified: Secondary | ICD-10-CM | POA: Diagnosis not present

## 2018-02-20 DIAGNOSIS — E1122 Type 2 diabetes mellitus with diabetic chronic kidney disease: Secondary | ICD-10-CM | POA: Diagnosis not present

## 2018-02-20 NOTE — Telephone Encounter (Signed)
Called.  Tiffany [name] reports Tiffany [name] was quite sedated this morning.  Apparently, she was alert and borderline aggitated yesterday afternnon when the home health RN did the intake.  I remain concerned with overmedication - and yet feel powerless since her psychiatrist prescribes these medications.  We agreed that with her son no longer in the home and Tiffany [name] only having an Aide 2-3 hours per day, she is not safe to live alone.  The home health social worker will visit and offer options for a higher level of residential care.

## 2018-02-20 NOTE — Telephone Encounter (Signed)
Chrissie Noa, home health aid with [facility] called to report a fall for this patient. Pt states she fell out of bed last night and that she has been falling daily. Lattie Haw states the pt is extremely lethargic and can barely stay sitting up. Please call Lattie Haw back at [phone]. Please advise

## 2018-02-20 NOTE — Telephone Encounter (Signed)
Attempted to call.  LM that I would call again later.

## 2018-02-21 DIAGNOSIS — J9611 Chronic respiratory failure with hypoxia: Secondary | ICD-10-CM | POA: Diagnosis not present

## 2018-02-21 DIAGNOSIS — M6281 Muscle weakness (generalized): Secondary | ICD-10-CM | POA: Diagnosis not present

## 2018-02-21 DIAGNOSIS — J440 Chronic obstructive pulmonary disease with acute lower respiratory infection: Secondary | ICD-10-CM | POA: Diagnosis not present

## 2018-02-21 DIAGNOSIS — J189 Pneumonia, unspecified organism: Secondary | ICD-10-CM | POA: Diagnosis not present

## 2018-02-21 DIAGNOSIS — R0602 Shortness of breath: Secondary | ICD-10-CM | POA: Diagnosis not present

## 2018-02-21 DIAGNOSIS — R062 Wheezing: Secondary | ICD-10-CM | POA: Diagnosis not present

## 2018-02-21 DIAGNOSIS — J45909 Unspecified asthma, uncomplicated: Secondary | ICD-10-CM | POA: Diagnosis not present

## 2018-02-21 DIAGNOSIS — I509 Heart failure, unspecified: Secondary | ICD-10-CM | POA: Diagnosis not present

## 2018-02-21 DIAGNOSIS — G4733 Obstructive sleep apnea (adult) (pediatric): Secondary | ICD-10-CM | POA: Diagnosis not present

## 2018-02-21 DIAGNOSIS — J449 Chronic obstructive pulmonary disease, unspecified: Secondary | ICD-10-CM | POA: Diagnosis not present

## 2018-02-22 DIAGNOSIS — I130 Hypertensive heart and chronic kidney disease with heart failure and stage 1 through stage 4 chronic kidney disease, or unspecified chronic kidney disease: Secondary | ICD-10-CM | POA: Diagnosis not present

## 2018-02-22 DIAGNOSIS — N183 Chronic kidney disease, stage 3 (moderate): Secondary | ICD-10-CM | POA: Diagnosis not present

## 2018-02-22 DIAGNOSIS — S2242XD Multiple fractures of ribs, left side, subsequent encounter for fracture with routine healing: Secondary | ICD-10-CM | POA: Diagnosis not present

## 2018-02-22 DIAGNOSIS — R4781 Slurred speech: Secondary | ICD-10-CM | POA: Diagnosis not present

## 2018-02-22 DIAGNOSIS — F3164 Bipolar disorder, current episode mixed, severe, with psychotic features: Secondary | ICD-10-CM | POA: Diagnosis not present

## 2018-02-22 DIAGNOSIS — E1122 Type 2 diabetes mellitus with diabetic chronic kidney disease: Secondary | ICD-10-CM | POA: Diagnosis not present

## 2018-02-22 DIAGNOSIS — I5032 Chronic diastolic (congestive) heart failure: Secondary | ICD-10-CM | POA: Diagnosis not present

## 2018-02-22 DIAGNOSIS — J449 Chronic obstructive pulmonary disease, unspecified: Secondary | ICD-10-CM | POA: Diagnosis not present

## 2018-02-22 DIAGNOSIS — G931 Anoxic brain damage, not elsewhere classified: Secondary | ICD-10-CM | POA: Diagnosis not present

## 2018-02-23 ENCOUNTER — Encounter (HOSPITAL_COMMUNITY): Payer: Self-pay | Admitting: *Deleted

## 2018-02-23 ENCOUNTER — Ambulatory Visit (HOSPITAL_COMMUNITY)
Admission: EM | Admit: 2018-02-23 | Discharge: 2018-02-23 | Disposition: A | Discharge status: Home / Self Care | Payer: Medicare HMO | Attending: Radiology | Admitting: Radiology

## 2018-02-23 DIAGNOSIS — J9611 Chronic respiratory failure with hypoxia: Secondary | ICD-10-CM | POA: Diagnosis not present

## 2018-02-23 DIAGNOSIS — Z760 Encounter for issue of repeat prescription: Secondary | ICD-10-CM | POA: Diagnosis not present

## 2018-02-23 MED ORDER — LISDEXAMFETAMINE DIMESYLATE 70 MG PO CAPS: 70.0000 mg | ORAL_CAPSULE | Freq: Every day | ORAL | 0 refills | Status: DC

## 2018-02-23 MED ORDER — FLUCONAZOLE 150 MG PO TABS: 150.0000 mg | ORAL_TABLET | Freq: Every day | ORAL | 0 refills | Status: DC

## 2018-02-23 NOTE — ED Triage Notes (Signed)
Reports "blood in pampers and in my stool" this week while being treated for BV and yeast infection per pt.  Reports having had constipation and doing an enema this past week.
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
## 2018-02-24 DIAGNOSIS — J189 Pneumonia, unspecified organism: Secondary | ICD-10-CM | POA: Diagnosis not present

## 2018-02-24 DIAGNOSIS — G4733 Obstructive sleep apnea (adult) (pediatric): Secondary | ICD-10-CM | POA: Diagnosis not present

## 2018-02-24 DIAGNOSIS — R062 Wheezing: Secondary | ICD-10-CM | POA: Diagnosis not present

## 2018-02-24 DIAGNOSIS — J9611 Chronic respiratory failure with hypoxia: Secondary | ICD-10-CM | POA: Diagnosis not present

## 2018-02-24 DIAGNOSIS — J45909 Unspecified asthma, uncomplicated: Secondary | ICD-10-CM | POA: Diagnosis not present

## 2018-02-24 DIAGNOSIS — R0602 Shortness of breath: Secondary | ICD-10-CM | POA: Diagnosis not present

## 2018-02-24 DIAGNOSIS — J440 Chronic obstructive pulmonary disease with acute lower respiratory infection: Secondary | ICD-10-CM | POA: Diagnosis not present

## 2018-02-27 ENCOUNTER — Other Ambulatory Visit: Payer: Self-pay

## 2018-02-27 ENCOUNTER — Encounter (HOSPITAL_COMMUNITY): Payer: Self-pay | Admitting: Obstetrics and Gynecology

## 2018-02-27 ENCOUNTER — Emergency Department (HOSPITAL_COMMUNITY): Payer: Medicare HMO

## 2018-02-27 ENCOUNTER — Emergency Department (HOSPITAL_COMMUNITY)
Admission: EM | Admit: 2018-02-27 | Discharge: 2018-02-27 | Disposition: A | Discharge status: Home / Self Care | Payer: Medicare HMO | Attending: Emergency Medicine | Admitting: Emergency Medicine

## 2018-02-27 DIAGNOSIS — S199XXA Unspecified injury of neck, initial encounter: Secondary | ICD-10-CM | POA: Diagnosis not present

## 2018-02-27 DIAGNOSIS — I5032 Chronic diastolic (congestive) heart failure: Secondary | ICD-10-CM | POA: Insufficient documentation

## 2018-02-27 DIAGNOSIS — J449 Chronic obstructive pulmonary disease, unspecified: Secondary | ICD-10-CM | POA: Diagnosis not present

## 2018-02-27 DIAGNOSIS — Z79899 Other long term (current) drug therapy: Secondary | ICD-10-CM | POA: Insufficient documentation

## 2018-02-27 DIAGNOSIS — J45909 Unspecified asthma, uncomplicated: Secondary | ICD-10-CM | POA: Diagnosis not present

## 2018-02-27 DIAGNOSIS — F419 Anxiety disorder, unspecified: Secondary | ICD-10-CM | POA: Insufficient documentation

## 2018-02-27 DIAGNOSIS — F439 Reaction to severe stress, unspecified: Secondary | ICD-10-CM | POA: Diagnosis not present

## 2018-02-27 DIAGNOSIS — M542 Cervicalgia: Secondary | ICD-10-CM | POA: Diagnosis not present

## 2018-02-27 DIAGNOSIS — R400 Somnolence: Secondary | ICD-10-CM | POA: Diagnosis not present

## 2018-02-27 DIAGNOSIS — S0990XA Unspecified injury of head, initial encounter: Secondary | ICD-10-CM | POA: Diagnosis not present

## 2018-02-27 DIAGNOSIS — N183 Chronic kidney disease, stage 3 (moderate): Secondary | ICD-10-CM | POA: Insufficient documentation

## 2018-02-27 DIAGNOSIS — Z8673 Personal history of transient ischemic attack (TIA), and cerebral infarction without residual deficits: Secondary | ICD-10-CM | POA: Insufficient documentation

## 2018-02-27 DIAGNOSIS — Z87891 Personal history of nicotine dependence: Secondary | ICD-10-CM | POA: Insufficient documentation

## 2018-02-27 DIAGNOSIS — I130 Hypertensive heart and chronic kidney disease with heart failure and stage 1 through stage 4 chronic kidney disease, or unspecified chronic kidney disease: Secondary | ICD-10-CM | POA: Diagnosis not present

## 2018-02-27 DIAGNOSIS — R52 Pain, unspecified: Secondary | ICD-10-CM | POA: Diagnosis not present

## 2018-02-27 DIAGNOSIS — F209 Schizophrenia, unspecified: Secondary | ICD-10-CM | POA: Insufficient documentation

## 2018-02-27 DIAGNOSIS — R4182 Altered mental status, unspecified: Secondary | ICD-10-CM | POA: Insufficient documentation

## 2018-02-27 DIAGNOSIS — E1122 Type 2 diabetes mellitus with diabetic chronic kidney disease: Secondary | ICD-10-CM | POA: Diagnosis not present

## 2018-02-27 DIAGNOSIS — Z7982 Long term (current) use of aspirin: Secondary | ICD-10-CM | POA: Insufficient documentation

## 2018-02-27 DIAGNOSIS — F319 Bipolar disorder, unspecified: Secondary | ICD-10-CM | POA: Insufficient documentation

## 2018-02-27 DIAGNOSIS — R079 Chest pain, unspecified: Secondary | ICD-10-CM | POA: Diagnosis not present

## 2018-02-27 DIAGNOSIS — S299XXA Unspecified injury of thorax, initial encounter: Secondary | ICD-10-CM | POA: Diagnosis not present

## 2018-02-27 LAB — CBC
HCT: 30.8 % — ABNORMAL LOW (ref 36.0–46.0)
Hemoglobin: 9.3 g/dL — ABNORMAL LOW (ref 12.0–15.0)
MCH: 28.4 pg (ref 26.0–34.0)
MCHC: 30.2 g/dL (ref 30.0–36.0)
MCV: 94.2 fL (ref 80.0–100.0)
Platelets: 367 10*3/uL (ref 150–400)
RBC: 3.27 MIL/uL — ABNORMAL LOW (ref 3.87–5.11)
RDW: 14.9 % (ref 11.5–15.5)
WBC: 4.9 10*3/uL (ref 4.0–10.5)
nRBC: 0 % (ref 0.0–0.2)

## 2018-02-27 LAB — I-STAT BETA HCG BLOOD, ED (NOT ORDERABLE): I-stat hCG, quantitative: <5 m[IU]/mL (ref <5)

## 2018-02-27 LAB — URINALYSIS, ROUTINE W REFLEX MICROSCOPIC
Bilirubin Urine: NEGATIVE
Glucose, UA: NEGATIVE mg/dL
Hgb urine dipstick: NEGATIVE
Ketones, ur: NEGATIVE mg/dL
Nitrite: NEGATIVE
Protein, ur: NEGATIVE mg/dL
Specific Gravity, Urine: 1.017 (ref 1.005–1.030)
pH: 5 (ref 5.0–8.0)

## 2018-02-27 LAB — BASIC METABOLIC PANEL
Anion gap: 10 (ref 5–15)
BUN: 15 mg/dL (ref 6–20)
CO2: 22 mmol/L (ref 22–32)
Calcium: 8.9 mg/dL (ref 8.9–10.3)
Chloride: 108 mmol/L (ref 98–111)
Creatinine, Ser: 2.38 mg/dL — ABNORMAL HIGH (ref 0.44–1.00)
GFR calc Af Amer: 25 mL/min — ABNORMAL LOW (ref >60)
GFR calc non Af Amer: 22 mL/min — ABNORMAL LOW (ref >60)
Glucose, Bld: 78 mg/dL (ref 70–99)
Potassium: 3.2 mmol/L — ABNORMAL LOW (ref 3.5–5.1)
Sodium: 140 mmol/L (ref 135–145)

## 2018-02-27 LAB — MAGNESIUM: Magnesium: 1.7 mg/dL (ref 1.7–2.4)

## 2018-02-27 LAB — RAPID URINE DRUG SCREEN, HOSP PERFORMED
Barbiturates: NOT DETECTED
Benzodiazepines: POSITIVE — AB
Cocaine: NOT DETECTED
Opiates: NOT DETECTED
Tetrahydrocannabinol: NOT DETECTED

## 2018-02-27 LAB — ETHANOL: Alcohol, Ethyl (B): <10 mg/dL (ref <10)

## 2018-02-27 MED ORDER — SODIUM CHLORIDE 0.9% FLUSH: 3.0000 mL | Freq: Once | INTRAVENOUS | Status: DC

## 2018-02-27 MED ORDER — AMMONIA AROMATIC IN INHA
0.3000 mL | Freq: Once | RESPIRATORY_TRACT | Status: AC
  Administered 2018-02-27: 0.3 mL via RESPIRATORY_TRACT
  Filled 2018-02-27: qty 10
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
## 2018-02-27 NOTE — Discharge Instructions (Addendum)
The testing today did not show any serious problems including stroke, heart attack or blood infections.  Consider seeing a counselor for stress management.  Return here if needed for problems.

## 2018-02-27 NOTE — ED Notes (Signed)
Bed: HA70 Expected date:  Expected time:  Means of arrival:  Comments: [place]
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
## 2018-02-28 DIAGNOSIS — J9611 Chronic respiratory failure with hypoxia: Secondary | ICD-10-CM | POA: Diagnosis not present

## 2018-03-01 ENCOUNTER — Telehealth: Payer: Self-pay | Admitting: *Deleted

## 2018-03-01 ENCOUNTER — Telehealth: Payer: Self-pay

## 2018-03-01 DIAGNOSIS — E1122 Type 2 diabetes mellitus with diabetic chronic kidney disease: Secondary | ICD-10-CM | POA: Diagnosis not present

## 2018-03-01 DIAGNOSIS — G931 Anoxic brain damage, not elsewhere classified: Secondary | ICD-10-CM | POA: Diagnosis not present

## 2018-03-01 DIAGNOSIS — J449 Chronic obstructive pulmonary disease, unspecified: Secondary | ICD-10-CM | POA: Diagnosis not present

## 2018-03-01 DIAGNOSIS — F3164 Bipolar disorder, current episode mixed, severe, with psychotic features: Secondary | ICD-10-CM | POA: Diagnosis not present

## 2018-03-01 DIAGNOSIS — S2242XD Multiple fractures of ribs, left side, subsequent encounter for fracture with routine healing: Secondary | ICD-10-CM | POA: Diagnosis not present

## 2018-03-01 DIAGNOSIS — I130 Hypertensive heart and chronic kidney disease with heart failure and stage 1 through stage 4 chronic kidney disease, or unspecified chronic kidney disease: Secondary | ICD-10-CM | POA: Diagnosis not present

## 2018-03-01 DIAGNOSIS — I5032 Chronic diastolic (congestive) heart failure: Secondary | ICD-10-CM | POA: Diagnosis not present

## 2018-03-01 DIAGNOSIS — N183 Chronic kidney disease, stage 3 (moderate): Secondary | ICD-10-CM | POA: Diagnosis not present

## 2018-03-01 DIAGNOSIS — R4781 Slurred speech: Secondary | ICD-10-CM | POA: Diagnosis not present

## 2018-03-01 NOTE — Telephone Encounter (Signed)
Tiffany [name], Tiffany [name], called nurse line reporting a fall by patient this past Wednesday. Tiffany [name] stated the pt fell while walking to her car, while using her walker, she tripped over her oxygen. Per Tiffany [name] at visit today, the pt did not have any bruising and seemed fine. Tiffany [name] is just reporting, per her company policy.  Upon chart review, the patient was taken to the ED on 2/12 for being non responsive at the [place] court house.

## 2018-03-01 NOTE — Telephone Encounter (Signed)
Tiffany [name] wanted to let PCP know that patient has missed 3 visits from PT.  They have been unable to reach by phone.   Derionna Salvador, [name], CMA

## 2018-03-04 DIAGNOSIS — J9611 Chronic respiratory failure with hypoxia: Secondary | ICD-10-CM | POA: Diagnosis not present

## 2018-03-04 NOTE — Telephone Encounter (Signed)
Noted  

## 2018-03-04 NOTE — Telephone Encounter (Signed)
noted 

## 2018-03-05 ENCOUNTER — Other Ambulatory Visit: Payer: Medicare HMO | Admitting: Internal Medicine

## 2018-03-05 DIAGNOSIS — G931 Anoxic brain damage, not elsewhere classified: Secondary | ICD-10-CM | POA: Diagnosis not present

## 2018-03-05 DIAGNOSIS — I5032 Chronic diastolic (congestive) heart failure: Secondary | ICD-10-CM | POA: Diagnosis not present

## 2018-03-05 DIAGNOSIS — N183 Chronic kidney disease, stage 3 (moderate): Secondary | ICD-10-CM | POA: Diagnosis not present

## 2018-03-05 DIAGNOSIS — R4781 Slurred speech: Secondary | ICD-10-CM | POA: Diagnosis not present

## 2018-03-05 DIAGNOSIS — F3164 Bipolar disorder, current episode mixed, severe, with psychotic features: Secondary | ICD-10-CM | POA: Diagnosis not present

## 2018-03-05 DIAGNOSIS — E1122 Type 2 diabetes mellitus with diabetic chronic kidney disease: Secondary | ICD-10-CM | POA: Diagnosis not present

## 2018-03-05 DIAGNOSIS — S2242XD Multiple fractures of ribs, left side, subsequent encounter for fracture with routine healing: Secondary | ICD-10-CM | POA: Diagnosis not present

## 2018-03-05 DIAGNOSIS — J449 Chronic obstructive pulmonary disease, unspecified: Secondary | ICD-10-CM | POA: Diagnosis not present

## 2018-03-05 DIAGNOSIS — I130 Hypertensive heart and chronic kidney disease with heart failure and stage 1 through stage 4 chronic kidney disease, or unspecified chronic kidney disease: Secondary | ICD-10-CM | POA: Diagnosis not present

## 2018-03-06 DIAGNOSIS — I5032 Chronic diastolic (congestive) heart failure: Secondary | ICD-10-CM | POA: Diagnosis not present

## 2018-03-06 DIAGNOSIS — G931 Anoxic brain damage, not elsewhere classified: Secondary | ICD-10-CM | POA: Diagnosis not present

## 2018-03-06 DIAGNOSIS — R4781 Slurred speech: Secondary | ICD-10-CM | POA: Diagnosis not present

## 2018-03-06 DIAGNOSIS — J449 Chronic obstructive pulmonary disease, unspecified: Secondary | ICD-10-CM | POA: Diagnosis not present

## 2018-03-06 DIAGNOSIS — I130 Hypertensive heart and chronic kidney disease with heart failure and stage 1 through stage 4 chronic kidney disease, or unspecified chronic kidney disease: Secondary | ICD-10-CM | POA: Diagnosis not present

## 2018-03-06 DIAGNOSIS — N183 Chronic kidney disease, stage 3 (moderate): Secondary | ICD-10-CM | POA: Diagnosis not present

## 2018-03-06 DIAGNOSIS — S2242XD Multiple fractures of ribs, left side, subsequent encounter for fracture with routine healing: Secondary | ICD-10-CM | POA: Diagnosis not present

## 2018-03-06 DIAGNOSIS — E1122 Type 2 diabetes mellitus with diabetic chronic kidney disease: Secondary | ICD-10-CM | POA: Diagnosis not present

## 2018-03-06 DIAGNOSIS — F3164 Bipolar disorder, current episode mixed, severe, with psychotic features: Secondary | ICD-10-CM | POA: Diagnosis not present

## 2018-03-07 DIAGNOSIS — J9611 Chronic respiratory failure with hypoxia: Secondary | ICD-10-CM | POA: Diagnosis not present

## 2018-03-08 ENCOUNTER — Encounter: Payer: Self-pay | Admitting: Emergency Medicine

## 2018-03-08 DIAGNOSIS — G931 Anoxic brain damage, not elsewhere classified: Secondary | ICD-10-CM | POA: Diagnosis not present

## 2018-03-08 DIAGNOSIS — E1122 Type 2 diabetes mellitus with diabetic chronic kidney disease: Secondary | ICD-10-CM | POA: Diagnosis not present

## 2018-03-08 DIAGNOSIS — S2242XD Multiple fractures of ribs, left side, subsequent encounter for fracture with routine healing: Secondary | ICD-10-CM | POA: Diagnosis not present

## 2018-03-08 DIAGNOSIS — J9611 Chronic respiratory failure with hypoxia: Secondary | ICD-10-CM | POA: Diagnosis not present

## 2018-03-08 DIAGNOSIS — R4781 Slurred speech: Secondary | ICD-10-CM | POA: Diagnosis not present

## 2018-03-08 DIAGNOSIS — I5032 Chronic diastolic (congestive) heart failure: Secondary | ICD-10-CM | POA: Diagnosis not present

## 2018-03-08 DIAGNOSIS — N183 Chronic kidney disease, stage 3 (moderate): Secondary | ICD-10-CM | POA: Diagnosis not present

## 2018-03-08 DIAGNOSIS — I130 Hypertensive heart and chronic kidney disease with heart failure and stage 1 through stage 4 chronic kidney disease, or unspecified chronic kidney disease: Secondary | ICD-10-CM | POA: Diagnosis not present

## 2018-03-08 DIAGNOSIS — J449 Chronic obstructive pulmonary disease, unspecified: Secondary | ICD-10-CM | POA: Diagnosis not present

## 2018-03-08 DIAGNOSIS — F3164 Bipolar disorder, current episode mixed, severe, with psychotic features: Secondary | ICD-10-CM | POA: Diagnosis not present

## 2018-03-08 NOTE — Telephone Encounter (Signed)
error 

## 2018-03-10 ENCOUNTER — Other Ambulatory Visit: Payer: Self-pay | Admitting: Cardiology

## 2018-03-10 ENCOUNTER — Ambulatory Visit (HOSPITAL_COMMUNITY)
Admission: EM | Admit: 2018-03-10 | Discharge: 2018-03-10 | Disposition: A | Discharge status: Home / Self Care | Payer: Medicare HMO | Attending: Emergency Medicine | Admitting: Emergency Medicine

## 2018-03-10 ENCOUNTER — Encounter (HOSPITAL_COMMUNITY): Payer: Self-pay | Admitting: Emergency Medicine

## 2018-03-10 ENCOUNTER — Other Ambulatory Visit: Payer: Self-pay

## 2018-03-10 ENCOUNTER — Ambulatory Visit (INDEPENDENT_AMBULATORY_CARE_PROVIDER_SITE_OTHER): Payer: Medicare HMO

## 2018-03-10 DIAGNOSIS — R0781 Pleurodynia: Secondary | ICD-10-CM

## 2018-03-10 DIAGNOSIS — S299XXA Unspecified injury of thorax, initial encounter: Secondary | ICD-10-CM | POA: Diagnosis not present

## 2018-03-10 DIAGNOSIS — R0789 Other chest pain: Secondary | ICD-10-CM

## 2018-03-10 DIAGNOSIS — W19XXXA Unspecified fall, initial encounter: Secondary | ICD-10-CM

## 2018-03-10 DIAGNOSIS — N761 Subacute and chronic vaginitis: Secondary | ICD-10-CM

## 2018-03-10 MED ORDER — MICONAZOLE NITRATE 200 MG VA SUPP: 200.0000 mg | Freq: Every day | VAGINAL | 0 refills | Status: DC

## 2018-03-10 MED ORDER — LIDOCAINE 5 % EX PTCH: 1.0000 | MEDICATED_PATCH | CUTANEOUS | 0 refills | Status: DC

## 2018-03-10 NOTE — ED Provider Notes (Signed)
[place]    CSN: [id number] Arrival date & time: 03/10/18  1441     History   Chief Complaint Chief Complaint  Patient presents with  . Fall    HPI ZEANNA SUNDE is a 58 y.o. female.   Marielena presents today with complaints of right rib pain s/p trip and fall. She states that prior to arrival she tripped landing on her chest. Denies loc or head injury. Has had left rib fractures and persistent left rib pain. She treats this with lidocaine patches. No shortness of breath  Or sternal chest pain. No arm or wrist pain. She applied a patch to right ribs and it has helped with the pain. Hx of substance abuse as well as kidney dysfunction, limiting pain management options. Patient also complains vaginal itching and discharge. States she has had yeast infections in the past as well as bv. They have improved with treatment but hasn't resolved. [name] incontinent of urine at baseline and wears depends. She has had a hysterectomy, doesn't follow with an ob or with a pcp regularly. She has been seen for similar if not the same complaints 5 times in the past two months. Was in the ER 2/12 due to altered mental status. Significant PMH, see list.    ROS per HPI.      Past Medical History:  Diagnosis Date  . Anxiety   . Arthritis    "all over" (04/10/2016)  . Asthma   . Cardiac arrest ([place]) 09/08/2016   PEA  . Carotid artery stenosis    1-39% bilateral by dopplers 11/2016  . Chronic bronchitis ([place])   . Chronic diastolic (congestive) heart failure ([place])   . Chronic kidney disease    "I see a kidney dr." (04/10/2016)  . Cocaine abuse ([place])   . Complication of anesthesia    decreased bp, decreased heart rate  . Depression   . Disorder of nervous system   . Emphysema   . GERD (gastroesophageal reflux disease)   . Heart attack ([place]) 1980s  . History of blood transfusion 1994   "couldn't stop bleeding from my period"  . Hyperlipidemia LDL goal <70   . Hypertension   .  Incontinence   . Manic depression ([place])   . On home oxygen therapy    "6L; 24/7" (04/10/2016)  . OSA on CPAP    "wear mask sometimes" (04/10/2016)  . Paranoid ([place])    "sometimes; I'm on RX for it" (04/10/2016)  . Pneumonia    "I've had it several times; haven't had it since 06/2015" (04/10/2016)  . Schizophrenia ([place])   . Seasonal allergies   . Seizures ([place])    "don't know what kind; last one was ~ 1 yr ago" (04/10/2016)  . Sinus trouble   . Stroke [facility]) 1980s   denies residual on 04/10/2016  . Type II diabetes mellitus [facility])     Patient Active Problem List   Diagnosis Date Noted  . Difficulty with speech 01/24/2018  . Vaginal discharge 01/03/2018  . Left-sided chest wall pain 12/06/2017  . AKI (acute kidney injury) ([place]) 11/21/2017  . Encephalopathy 11/21/2017  . Drug abuse ([place]) 11/21/2017  . Frequent falls 10/11/2017  . Dependence on continuous supplemental oxygen 05/14/2017  . Gout 04/11/2017  . Palliative care encounter 03/12/2017  . CKD (chronic kidney disease) stage 3, GFR 30-59 ml/min (HCC) 12/15/2016  . Carotid artery stenosis   . Osteoarthritis 10/26/2016  . Anoxic brain injury ([place]) 09/08/2016  . Overactive  bladder 06/07/2016  . QT prolongation   . OSA and COPD overlap syndrome ([place])   . Arthritis   . Essential hypertension 03/22/2016  . Bipolar affective disorder, mixed, severe, with psychotic behavior ([place]) 11/28/2015  . History of drug abuse in remission ([place]) 11/28/2015  . Chronic diastolic congestive heart failure ([place])   . Chronic respiratory failure with hypoxia and hypercapnia ([place]) 06/22/2015  . Tobacco use disorder 07/22/2014  . COPD (chronic obstructive pulmonary disease) ([place]) 07/08/2014  . Seizure ([place]) 01/04/2013  . Chronic pain syndrome 06/18/2012  . Dyslipidemia 04/24/2011  . Anemia 04/24/2011  . Diabetic neuropathy ([place]) 04/24/2011  . Obstructive sleep apnea 10/18/2010  . Asthma 10/18/2010  . Morbid obesity ([place]) 10/18/2010  . Type 2  diabetes mellitus ([place]) 10/18/2010    Past Surgical History:  Procedure Laterality Date  . CESAREAN SECTION  1997  . HERNIA REPAIR    . IR CHOLANGIOGRAM EXISTING TUBE  07/20/2016  . IR PERC CHOLECYSTOSTOMY  05/10/2016  . IR RADIOLOGIST EVAL & MGMT  06/08/2016  . IR RADIOLOGIST EVAL & MGMT  06/29/2016  . IR SINUS/FIST TUBE CHK-NON GI  07/12/2016  . RIGHT/LEFT HEART CATH AND CORONARY ANGIOGRAPHY N/A 06/19/2017   Procedure: RIGHT/LEFT HEART CATH AND CORONARY ANGIOGRAPHY;  Surgeon: Jolaine Artist, MD;  Location: [facility];  Service: Cardiovascular;  Laterality: N/A;  . TIBIA IM NAIL INSERTION Right 07/12/2016   Procedure: INTRAMEDULLARY (IM) NAIL RIGHT TIBIA;  Surgeon: Leandrew Koyanagi, MD;  Location: [facility];  Service: Orthopedics;  Laterality: Right;  . UMBILICAL HERNIA REPAIR  ~ 1963   "that's why I don't have a belly button"  . VAGINAL HYSTERECTOMY      OB History   No obstetric history on file.      Home Medications    Prior to Admission medications   Medication Sig Start Date End Date Taking? Authorizing Provider  albuterol (PROVENTIL) (2.5 MG/3ML) 0.083% nebulizer solution Take 3 mLs (2.5 mg total) by nebulization every 6 (six) hours as needed for wheezing or shortness of breath. 06/06/17   Mikell, Jeani Sow, MD  amLODipine (NORVASC) 5 MG tablet Take 5 mg by mouth daily.    [provider]  aspirin (GOODSENSE ASPIRIN LOW DOSE) 81 MG EC tablet TAKE 1 TABLET (81 MG TOTAL) BY MOUTH DAILY (MORNING). 11/12/17   Zenia Resides, MD  colchicine (COLCRYS) 0.6 MG tablet TAKE 1 TABLET (0.6 MG TOTAL) BY MOUTH 2 (TWO) TIMES DAILY. 10/17/17   Zenia Resides, MD  diazepam (VALIUM) 10 MG tablet Take 10 mg by mouth 3 (three) times daily. 11/21/17   [provider]  diclofenac sodium (VOLTAREN) 1 % GEL Apply 2 g topically 4 (four) times daily. 01/21/18   Wieters, Hallie C, PA-C  Febuxostat (ULORIC) 80 MG TABS Take 1 tablet (80 mg total) by mouth daily. 11/12/17   Zenia Resides, MD  ferrous sulfate (FEROSUL) 325 (65 FE) MG tablet TAKE 1 TABLET BY MOUTH DAILY.(MORNING) 11/12/17   Zenia Resides, MD  flunisolide (NASALIDE) 25 MCG/ACT (0.025%) SOLN Place 2 sprays into the nose daily. 06/26/17   McDiarmid, Blane Ohara, MD  Fluticasone-Umeclidin-Vilant (TRELEGY ELLIPTA) 100-62.5-25 MCG/INH AEPB Inhale 1 puff into the lungs daily. 07/26/17   Collene Gobble, MD  gabapentin (NEURONTIN) 600 MG tablet Take 1 tablet (600 mg total) by mouth 2 (two) times daily. 11/23/17   Sela Hilding, MD  lidocaine (LIDODERM) 5 % Place 1 patch onto the skin daily. Remove & Discard  patch within 12 hours or as directed by MD 03/10/18   Zigmund Gottron, NP  lisdexamfetamine (VYVANSE) 70 MG capsule Take 1 capsule (70 mg total) by mouth daily. 02/23/18   Jacqualine Mau, NP  metolazone (ZAROXOLYN) 2.5 MG tablet TAKE 1 TABLET BY MOUTH EVERY MONDAY, WEDNESDAY, AND FRIDAY. TAKE 30 MINS PRIOR TO TAKING TORSEMIDE 01/24/18   Lyda Jester M, PA-C  miconazole (MICOTIN) 200 MG vaginal suppository Place 1 suppository (200 mg total) vaginally at bedtime. 03/10/18   Zigmund Gottron, NP  montelukast (SINGULAIR) 10 MG tablet Take 1 tablet (10 mg total) by mouth at bedtime. 08/29/17   Zenia Resides, MD  Multiple Vitamin (MULTIVITAMIN WITH MINERALS) TABS tablet Take 1 tablet by mouth daily.    [provider]  omeprazole (PRILOSEC) 40 MG capsule Take 1 capsule (40 mg total) by mouth 2 (two) times daily. 10/17/17   Zenia Resides, MD  oxybutynin (DITROPAN-XL) 10 MG 24 hr tablet TAKE 1 TABLET BY MOUTH EVERYDAY AT BEDTIME 11/12/17   Hensel, Jamal Collin, MD  potassium chloride (KLOR-CON M10) 10 MEQ tablet Take 4 tablets (40 mEq total) by mouth daily. Patient taking differently: Take 30 mEq by mouth 2 (two) times daily.  11/23/17   Sela Hilding, MD  SAPHRIS 2.5 MG SUBL Take 2.5 mg by mouth 2 (two) times daily. 12/24/17   [provider]  torsemide (DEMADEX) 20 MG tablet Take 4  tablets (80 mg total) by mouth 2 (two) times daily. 06/06/17   Mikell, Jeani Sow, MD  traZODone (DESYREL) 100 MG tablet 50 mg in the AM and 150 mg at night Patient taking differently: Take 100 mg by mouth at bedtime. 50 mg in the AM and 150 mg at night 06/25/17   Mikell, Jeani Sow, MD  zinc oxide 20 % ointment Apply 1 application topically as needed for irritation. 02/07/18   Zenia Resides, MD    Family History Family History  Problem Relation Age of Onset  . Cancer Father        prostate  . Cancer Mother        lung  . Depression Mother   . Depression Sister   . Anxiety disorder Sister   . Schizophrenia Sister   . Bipolar disorder Sister   . Depression Sister   . Depression Brother   . Heart failure Other        cousin    Social History Social History   Tobacco Use  . Smoking status: Former Smoker    Packs/day: 1.50    Years: 38.00    Pack years: 57.00    Types: Cigarettes    Start date: 03/13/1977    Last attempt to quit: 04/10/2016    Years since quitting: 1.9  . Smokeless tobacco: Never Used  Substance Use Topics  . Alcohol use: No    Alcohol/week: 0.0 standard drinks  . Drug use: No    Types: Cocaine    Comment: 04/10/2016 "last used cocaine back in November 2017"     Allergies   Hydrocodone-acetaminophen; Hydroxyzine; Latuda [lurasidone hcl]; Magnesium-containing compounds; Prednisone; Tramadol; Codeine; Sulfa antibiotics; and Tape   Review of Systems Review of Systems   Physical Exam Triage Vital Signs ED Triage Vitals  Enc Vitals Group     BP --      Pulse Rate 03/10/18 1642 71     Resp 03/10/18 1642 18     Temp 03/10/18 1642 98.3 F (36.8 C)  Temp Source 03/10/18 1642 Oral     SpO2 03/10/18 1642 100 %     Weight --      Height --      Head Circumference --      Peak Flow --      Pain Score 03/10/18 1640 8     Pain Loc --      Pain Edu? --      Excl. in [place]? --    No data found.  Updated Vital Signs Pulse 71   Temp 98.3 F  (36.8 C) (Oral)   Resp 18   SpO2 100%   Visual Acuity Right Eye Distance:   Left Eye Distance:   Bilateral Distance:    Right Eye Near:   Left Eye Near:    Bilateral Near:     Physical Exam Constitutional:      General: [name] not in acute distress.    Appearance: [name] well-developed.  Cardiovascular:     Rate and Rhythm: Normal rate and regular rhythm.     Heart sounds: Normal heart sounds.  Pulmonary:     Effort: Pulmonary effort is normal.     Breath sounds: Normal breath sounds.  Chest:     Chest wall: Tenderness present.       Comments: Tenderness to generalized anterior chest wall on palpation; lungs clear  Genitourinary:    Comments: Patient provides a used depend from home she states she would like this provider to look at- urine saturated; pelvic exam deferred at this time- no pelvic pain, bleeding. Denies sores or lesions. Patient declines vaginal swab or urine cytology  Skin:    General: Skin is warm and dry.  Neurological:     Mental Status: [name] alert and oriented to person, place, and time.      UC Treatments / Results  Labs (all labs ordered are listed, but only abnormal results are displayed) Labs Reviewed - No data to display  EKG None  Radiology Dg Ribs Unilateral W/chest Right  Result Date: 03/10/2018 CLINICAL DATA:  Right chest pain due to an injury suffered in a fall today. Initial encounter. EXAM: RIGHT RIBS AND CHEST - 3+ VIEW COMPARISON:  PA and lateral chest 02/26/2018 and 12/15/2017. Plain films of the chest and left ribs 01/21/2018. FINDINGS: The lungs are clear. No pneumothorax or pleural effusion. Heart size is normal no right rib fracture. No acute fracture. Remote left rib fractures noted. IMPRESSION: No acute disease.  Negative for acute fracture. Left rib fractures as seen on prior studies. Electronically Signed   By: Inge Rise M.D.   On: 03/10/2018 17:28    Procedures Procedures (including critical care  time)  Medications Ordered in UC Medications - No data to display  Initial Impression / Assessment and Plan / UC Course  I have reviewed the triage vital signs and the nursing notes.  Pertinent labs & imaging results that were available during my care of the patient were reviewed by me and considered in my medical decision making (see chart for details).     Patient presents with similar complaint multiple times in UC. Right rib xray without acute findings today. Patient refuses vaginal swab or urine testing for bv/yeast. Monistat provided, to continue with lidocaine patch prn pain. Encouraged follow up with gyn and/or PCP for management of symptoms. Return precautions provided. Patient verbalized understanding and agreeable to plan.    Final Clinical Impressions(s) / UC Diagnoses   Final diagnoses:  Rib pain  Chronic vaginitis     Discharge Instructions     I have refilled lidocaine patches which can be applied to your areas of pain.  I have sent in medication which can be used for your vaginal symptoms.  Please follow up with your PCP and/or gynecology for recheck as you have had persistent symptoms.  If develop increased shortness of breath , chest pain , dizziness, or weakness please go to the ER.     ED Prescriptions    Medication Sig Dispense Auth. Provider   lidocaine (LIDODERM) 5 % Place 1 patch onto the skin daily. Remove & Discard patch within 12 hours or as directed by MD 30 patch Burky, Natalie B, NP   miconazole (MICOTIN) 200 MG vaginal suppository Place 1 suppository (200 mg total) vaginally at bedtime. 3 suppository Zigmund Gottron, NP     Controlled Substance Prescriptions [place] Controlled Substance Registry consulted? Not Applicable   Zigmund Gottron, NP 03/10/18 [phone]

## 2018-03-10 NOTE — Discharge Instructions (Addendum)
I have refilled lidocaine patches which can be applied to your areas of pain.  I have sent in medication which can be used for your vaginal symptoms.  Please follow up with your PCP and/or gynecology for recheck as you have had persistent symptoms.  If develop increased shortness of breath , chest pain , dizziness, or weakness please go to the ER.

## 2018-03-10 NOTE — ED Triage Notes (Signed)
The patient presented to the [facility] with a complaint of right side rib pain secondary to a fall that occurred earlier today.

## 2018-03-11 DIAGNOSIS — J9611 Chronic respiratory failure with hypoxia: Secondary | ICD-10-CM | POA: Diagnosis not present

## 2018-03-12 ENCOUNTER — Encounter: Payer: Self-pay | Admitting: Emergency Medicine

## 2018-03-12 ENCOUNTER — Ambulatory Visit (INDEPENDENT_AMBULATORY_CARE_PROVIDER_SITE_OTHER): Payer: Medicare HMO | Admitting: Emergency Medicine

## 2018-03-12 ENCOUNTER — Telehealth: Payer: Self-pay | Admitting: Family Medicine

## 2018-03-12 VITALS — BP 122/78 | HR 80 | Ht 64.0 in

## 2018-03-12 DIAGNOSIS — J9611 Chronic respiratory failure with hypoxia: Secondary | ICD-10-CM

## 2018-03-12 DIAGNOSIS — G4733 Obstructive sleep apnea (adult) (pediatric): Secondary | ICD-10-CM | POA: Diagnosis not present

## 2018-03-12 DIAGNOSIS — J9612 Chronic respiratory failure with hypercapnia: Secondary | ICD-10-CM

## 2018-03-12 DIAGNOSIS — J42 Unspecified chronic bronchitis: Secondary | ICD-10-CM | POA: Diagnosis not present

## 2018-03-12 DIAGNOSIS — J449 Chronic obstructive pulmonary disease, unspecified: Secondary | ICD-10-CM | POA: Diagnosis not present

## 2018-03-12 NOTE — Addendum Note (Signed)
Addended by: Annie Paras D on: 03/12/2018 02:31 PM   Modules accepted: Orders

## 2018-03-12 NOTE — Telephone Encounter (Signed)
Ms Eiben called and was extremely upset with me.  In a loud voice she said that "You need to take that stuff out of my chart saying I need to be in a nursing home.  I follow up with my psychiatrist and I get all the care I need at home.  You don't know.  You are no longer my doctor.  Stop making notes in my chart."   I apologized that I was unable to meet her needs.  I indicated my hope that she gets the care she needs.  I also said the last thing I would put in her chart is a note from this phone call documenting her displeasure with my care and her disagreeing with needing more care.  She reiterated that I was no longer her doctor and then hung up abruptly.

## 2018-03-12 NOTE — Assessment & Plan Note (Signed)
Please continue Trelegy 1 inhalation once daily.  Rinse and gargle after using. Keep albuterol available use 2 puffs if needed for shortness of breath, chest tightness, wheezing. Follow with Dr Lamonte Sakai in 6 months or sooner if you have any problems

## 2018-03-12 NOTE — Telephone Encounter (Signed)
Pt would like Hensel to give her a call. Please advise

## 2018-03-12 NOTE — Patient Instructions (Addendum)
Please continue Trelegy 1 inhalation once daily.  Rinse and gargle after using. Keep albuterol available use 2 puffs if needed for shortness of breath, chest tightness, wheezing. Continue your oxygen at 4 L/min.  We will assess your oxygen levels to see if we can qualify you for a portable oxygen concentrator, 4 L/min pulsed flow. Please continue to use your trilogy ventilator machine every night while sleeping I believe that you need to reestablish care and follow-up with a primary care physician Follow with Dr Lamonte Sakai in 6 months or sooner if you have any problems

## 2018-03-12 NOTE — Progress Notes (Signed)
Subjective:    Patient ID: Tiffany [name], female    DOB: 10/15/1960, 58 y.o.   MRN: [id number]  HPI  ROV 10/09/17 --follow-up visit for 58 year old woman with a history of obesity, schizophrenia, tobacco use with associated COPD (asthmatic component).  She has OSA/OHS with mild secondary pulmonary hypertension (48/18, mean 29 on 06/19/2017).  She uses 6 to 8 L oxygen per minute, uses a trilogy ventilator reliably nightly.  Current bronchodilator regimen is Trelegy and albuterol which she uses approximately.  [name] on Singulair and loratadine, omeprazole 40 mg twice daily. Her functional capacity is limited, uses an Dealer wheelchair much of the time. She has fallen a few times since last time. [name] using her Trilogy at night, but notes that she is getting less sleep overall. She never heard anything about the Boost program. [name] affiliated with palliative care, has been visited by Jannett Celestine, NP. She remains on 6-8L/min. Remains remains on Trelegy, feels that she benefits. Offered the flu shot today - she deferred.   ROV 05/10/93 --complicated case due to overlapping issues and difficulty with compliance.  [name] 64, has obesity, schizophrenia, COPD with a positive bronchodilator response.  She has OSA/OHS with mild associated pulmonary hypertension.  She has resultant daytime hypoxemia and requires a trilogy ventilator nightly.  She was last seen here in November after hospitalization, has sustained multiple falls - ? Medication effect, oversedation and required intubation in early Nov. She reports multiple problems today. She has had been having falls, has unsteadiness and dyspnea when she ambulates. [name] on Trelegy, feels that it helps her. Uses albuterol rarely. She reports good compliance with her Trilogy, wears it reliably - she is concerned that she may not be receiving high enough pressures.   ROV 03/12/2018 --58 year old woman with obesity, schizophrenia, COPD with an asthmatic component,  OSA/OHS with resultant daytime hypoxemia and hypoventilation.  [name] on a trilogy ventilator at night, good compliance.  She has been having falls, has had issues with oversedation.  A review of the chart shows that she has been in the Urgent Care 6 times since last time I saw her only 6 weeks ago. She remains on Trelegy > tolerating and benefiting from it, needs a new script for this. [name] interested in getting a POC, trading in her O2 tanks. A review of the notes shows that Dr Andria Frames is exploring residential care. Not clear to me that pt will go along with this willingly.    Review of Systems  Constitutional: Positive for unexpected weight change. Negative for fever.  HENT: Positive for dental problem and sinus pressure. Negative for congestion, ear pain, nosebleeds, postnasal drip, rhinorrhea, sneezing, sore throat and trouble swallowing.   Eyes: Negative.  Negative for redness and itching.  Respiratory: Positive for cough, shortness of breath and wheezing. Negative for chest tightness.   Cardiovascular: Positive for leg swelling. Negative for palpitations.  Gastrointestinal: Negative.  Negative for nausea and vomiting.  Endocrine: Negative.   Genitourinary: Negative.  Negative for dysuria.  Musculoskeletal: Positive for joint swelling.  Skin: Negative.  Negative for rash.  Allergic/Immunologic: Negative.   Neurological: Negative.  Negative for headaches.  Hematological: Bruises/bleeds easily.  Psychiatric/Behavioral: Negative.  Negative for dysphoric mood. The patient is not nervous/anxious.       Objective:   Physical Exam Vitals:   03/12/18 1352  BP: 122/78  Pulse: 80  SpO2: 100%  Height: 5\' 4"  (1.626 m)   Gen: Pleasant, overwt, in  no distress, somewhat depressed affect, in wheelchair  ENT: No lesions,  mouth clear,  oropharynx clear, no postnasal drip  Neck: No JVD, no stridor  Lungs: No use of accessory muscles, bilateral basilar insp crackles.   Cardiovascular: RRR,  heart sounds normal, no murmur or gallops, trace LE  peripheral edema  Musculoskeletal: No deformities, no cyanosis or clubbing  Neuro: alert, a bit slow to respond, moves her UE, weak B LE's  Skin: Warm, no lesions or rashes       Assessment & Plan:  COPD (chronic obstructive pulmonary disease) ([name]) Please continue Trelegy 1 inhalation once daily.  Rinse and gargle after using. Keep albuterol available use 2 puffs if needed for shortness of breath, chest tightness, wheezing. Follow with Dr Lamonte Sakai in 6 months or sooner if you have any problems  OSA and COPD overlap syndrome ([place]) Continue your oxygen at 4 L/min.  We will assess your oxygen levels to see if we can qualify you for a portable oxygen concentrator, 4 L/min pulsed flow. Please continue to use your trilogy ventilator machine every night while sleeping   Baltazar Apo, MD, PhD 03/12/2018, 2:14 PM [facility] [phone] or if no answer [phone]

## 2018-03-12 NOTE — Assessment & Plan Note (Signed)
Continue your oxygen at 4 L/min.  We will assess your oxygen levels to see if we can qualify you for a portable oxygen concentrator, 4 L/min pulsed flow. Please continue to use your trilogy ventilator machine every night while sleeping

## 2018-03-13 ENCOUNTER — Telehealth: Payer: Self-pay | Admitting: Emergency Medicine

## 2018-03-13 ENCOUNTER — Ambulatory Visit (INDEPENDENT_AMBULATORY_CARE_PROVIDER_SITE_OTHER): Payer: Medicare HMO | Admitting: Orthopaedic Surgery

## 2018-03-13 ENCOUNTER — Ambulatory Visit (INDEPENDENT_AMBULATORY_CARE_PROVIDER_SITE_OTHER): Payer: Medicare HMO

## 2018-03-13 ENCOUNTER — Ambulatory Visit: Payer: Self-pay | Admitting: Family Medicine

## 2018-03-13 ENCOUNTER — Encounter (INDEPENDENT_AMBULATORY_CARE_PROVIDER_SITE_OTHER): Payer: Self-pay | Admitting: Orthopaedic Surgery

## 2018-03-13 DIAGNOSIS — S82221D Displaced transverse fracture of shaft of right tibia, subsequent encounter for closed fracture with routine healing: Secondary | ICD-10-CM

## 2018-03-13 NOTE — Progress Notes (Signed)
Office Visit Note   Patient: Tiffany [name]           Date of Birth: [birth year]           MRN: [id number] Visit Date: 03/13/2018              Requested by: Tiffany Resides, MD [address] [place], [place] [address] PCP: Tiffany Resides, MD   Assessment & Plan: Visit Diagnoses:  1. Closed displaced transverse fracture of shaft of right tibia with routine healing, subsequent encounter     Plan: From my standpoint everything appears to be normal for postoperative and posttraumatic changes.  I think her difficulty in ambulation comes for the fact that she is lost a lot of weight has gotten very deconditioned and weak.  I gave her a prescription for physical therapy to work on ambulation and gait training and balance and strengthening.  From my standpoint she looks good.  Follow-up as needed.  Follow-Up Instructions: Return if symptoms worsen or fail to improve.   Orders:  Orders Placed This Encounter  Procedures  . XR Tibia/Fibula Right   No orders of the defined types were placed in this encounter.     Procedures: No procedures performed   Clinical Data: No additional findings.   Subjective: Chief Complaint  Patient presents with  . Right Leg - Pain    Tiffany [name] is a 58 year old female who comes in today for right lower leg pain.  [name] 20 months status post intramedullary tibial fracture fixation.  She has recently lost a lot of weight.  She states that it causes difficulty and pain around her leg with increased walking.  [name] also describing dark discoloration of the skin around the midportion of the tibia.  Denies any constitutional symptoms.   Review of Systems  Constitutional: Negative.   HENT: Negative.   Eyes: Negative.   Respiratory: Negative.   Cardiovascular: Negative.   Endocrine: Negative.   Musculoskeletal: Negative.   Neurological: Negative.   Hematological: Negative.   Psychiatric/Behavioral: Negative.   All other systems reviewed and  are negative.    Objective: Vital Signs: There were no vitals taken for this visit.  Physical Exam Vitals signs and nursing note reviewed.  Constitutional:      Appearance: [name] well-developed.  Pulmonary:     Effort: Pulmonary effort is normal.  Skin:    General: Skin is warm.     Capillary Refill: Capillary refill takes less than 2 seconds.  Neurological:     Mental Status: [name] alert and oriented to person, place, and time.  Psychiatric:        Behavior: Behavior normal.        Thought Content: Thought content normal.        Judgment: Judgment normal.     Ortho Exam Right lower leg exam shows fully healed surgical scars.  She has dark discoloration at the level of the fracture site consistent with prominent skin color changes from hemosiderin. Specialty Comments:  No specialty comments available.  Imaging: Xr Tibia/fibula Right  Result Date: 03/13/2018 Fully healed tibia fracture with mature callus.  No complications.    PMFS History: Patient Active Problem List   Diagnosis Date Noted  . Difficulty with speech 01/24/2018  . Vaginal discharge 01/03/2018  . Left-sided chest wall pain 12/06/2017  . AKI (acute kidney injury) ([place]) 11/21/2017  . Encephalopathy 11/21/2017  . Drug abuse ([place]) 11/21/2017  . Frequent falls 10/11/2017  .  Dependence on continuous supplemental oxygen 05/14/2017  . Gout 04/11/2017  . Palliative care encounter 03/12/2017  . CKD (chronic kidney disease) stage 3, GFR 30-59 ml/min (HCC) 12/15/2016  . Carotid artery stenosis   . Osteoarthritis 10/26/2016  . Anoxic brain injury ([place]) 09/08/2016  . Overactive bladder 06/07/2016  . QT prolongation   . OSA and COPD overlap syndrome ([place])   . Arthritis   . Essential hypertension 03/22/2016  . Bipolar affective disorder, mixed, severe, with psychotic behavior ([place]) 11/28/2015  . History of drug abuse in remission ([place]) 11/28/2015  . Chronic diastolic congestive heart failure ([place])   .  Chronic respiratory failure with hypoxia and hypercapnia ([place]) 06/22/2015  . Tobacco use disorder 07/22/2014  . COPD (chronic obstructive pulmonary disease) ([place]) 07/08/2014  . Seizure ([place]) 01/04/2013  . Chronic pain syndrome 06/18/2012  . Dyslipidemia 04/24/2011  . Anemia 04/24/2011  . Diabetic neuropathy ([place]) 04/24/2011  . Obstructive sleep apnea 10/18/2010  . Asthma 10/18/2010  . Morbid obesity ([place]) 10/18/2010  . Type 2 diabetes mellitus ([place]) 10/18/2010   Past Medical History:  Diagnosis Date  . Anxiety   . Arthritis    "all over" (04/10/2016)  . Asthma   . Cardiac arrest ([place]) 09/08/2016   PEA  . Carotid artery stenosis    1-39% bilateral by dopplers 11/2016  . Chronic bronchitis ([place])   . Chronic diastolic (congestive) heart failure ([place])   . Chronic kidney disease    "I see a kidney dr." (04/10/2016)  . Cocaine abuse ([place])   . Complication of anesthesia    decreased bp, decreased heart rate  . Depression   . Disorder of nervous system   . Emphysema   . GERD (gastroesophageal reflux disease)   . Heart attack ([place]) 1980s  . History of blood transfusion 1994   "couldn't stop bleeding from my period"  . Hyperlipidemia LDL goal <70   . Hypertension   . Incontinence   . Manic depression ([place])   . On home oxygen therapy    "6L; 24/7" (04/10/2016)  . OSA on CPAP    "wear mask sometimes" (04/10/2016)  . Paranoid ([place])    "sometimes; I'm on RX for it" (04/10/2016)  . Pneumonia    "I've had it several times; haven't had it since 06/2015" (04/10/2016)  . Schizophrenia ([place])   . Seasonal allergies   . Seizures ([place])    "don't know what kind; last one was ~ 1 yr ago" (04/10/2016)  . Sinus trouble   . Stroke [facility]) 1980s   denies residual on 04/10/2016  . Type II diabetes mellitus (HCC)     Family History  Problem Relation Age of Onset  . Cancer Father        prostate  . Cancer Mother        lung  . Depression Mother   . Depression Sister   . Anxiety disorder Sister    . Schizophrenia Sister   . Bipolar disorder Sister   . Depression Sister   . Depression Brother   . Heart failure Other        cousin    Past Surgical History:  Procedure Laterality Date  . CESAREAN SECTION  1997  . HERNIA REPAIR    . IR CHOLANGIOGRAM EXISTING TUBE  07/20/2016  . IR PERC CHOLECYSTOSTOMY  05/10/2016  . IR RADIOLOGIST EVAL & MGMT  06/08/2016  . IR RADIOLOGIST EVAL & MGMT  06/29/2016  . IR SINUS/FIST TUBE CHK-NON GI  07/12/2016  .  RIGHT/LEFT HEART CATH AND CORONARY ANGIOGRAPHY N/A 06/19/2017   Procedure: RIGHT/LEFT HEART CATH AND CORONARY ANGIOGRAPHY;  Surgeon: Jolaine Artist, MD;  Location: [facility];  Service: Cardiovascular;  Laterality: N/A;  . TIBIA IM NAIL INSERTION Right 07/12/2016   Procedure: INTRAMEDULLARY (IM) NAIL RIGHT TIBIA;  Surgeon: Leandrew Koyanagi, MD;  Location: [facility];  Service: Orthopedics;  Laterality: Right;  . UMBILICAL HERNIA REPAIR  ~ 1963   "that's why I don't have a belly button"  . VAGINAL HYSTERECTOMY     Social History   Occupational History  . Occupation: disabled    Comment: factory production  Tobacco Use  . Smoking status: Former Smoker    Packs/day: 1.50    Years: 38.00    Pack years: 57.00    Types: Cigarettes    Start date: 03/13/1977    Last attempt to quit: 04/10/2016    Years since quitting: 1.9  . Smokeless tobacco: Never Used  Substance and Sexual Activity  . Alcohol use: No    Alcohol/week: 0.0 standard drinks  . Drug use: No    Types: Cocaine    Comment: 04/10/2016 "last used cocaine back in November 2017"  . Sexual activity: Not Currently    Birth control/protection: Surgical

## 2018-03-13 NOTE — Telephone Encounter (Signed)
LMTCB. Need to make patient aware there are not any POC to accomodate a 6L oxygen flow.   Call made to [facility], they do not have any POC that will accommodate a flow liter of 6L continous. Started services in 2011 she would be eligible to transfer to a different DME company however there are not any POC that accommodate 6L continous.

## 2018-03-14 ENCOUNTER — Ambulatory Visit: Payer: Self-pay | Admitting: Family Medicine

## 2018-03-14 DIAGNOSIS — N183 Chronic kidney disease, stage 3 (moderate): Secondary | ICD-10-CM | POA: Diagnosis not present

## 2018-03-14 DIAGNOSIS — S2242XD Multiple fractures of ribs, left side, subsequent encounter for fracture with routine healing: Secondary | ICD-10-CM | POA: Diagnosis not present

## 2018-03-14 DIAGNOSIS — J449 Chronic obstructive pulmonary disease, unspecified: Secondary | ICD-10-CM | POA: Diagnosis not present

## 2018-03-14 DIAGNOSIS — G931 Anoxic brain damage, not elsewhere classified: Secondary | ICD-10-CM | POA: Diagnosis not present

## 2018-03-14 DIAGNOSIS — E1122 Type 2 diabetes mellitus with diabetic chronic kidney disease: Secondary | ICD-10-CM | POA: Diagnosis not present

## 2018-03-14 DIAGNOSIS — I130 Hypertensive heart and chronic kidney disease with heart failure and stage 1 through stage 4 chronic kidney disease, or unspecified chronic kidney disease: Secondary | ICD-10-CM | POA: Diagnosis not present

## 2018-03-14 DIAGNOSIS — I5032 Chronic diastolic (congestive) heart failure: Secondary | ICD-10-CM | POA: Diagnosis not present

## 2018-03-14 DIAGNOSIS — F3164 Bipolar disorder, current episode mixed, severe, with psychotic features: Secondary | ICD-10-CM | POA: Diagnosis not present

## 2018-03-14 DIAGNOSIS — R4781 Slurred speech: Secondary | ICD-10-CM | POA: Diagnosis not present

## 2018-03-14 NOTE — Telephone Encounter (Signed)
ATC patient, rang twice then got a fast busy signal.

## 2018-03-15 NOTE — Telephone Encounter (Signed)
ATC patient. Unable to leave message.

## 2018-03-16 DIAGNOSIS — J45909 Unspecified asthma, uncomplicated: Secondary | ICD-10-CM | POA: Diagnosis not present

## 2018-03-16 DIAGNOSIS — J189 Pneumonia, unspecified organism: Secondary | ICD-10-CM | POA: Diagnosis not present

## 2018-03-16 DIAGNOSIS — G4733 Obstructive sleep apnea (adult) (pediatric): Secondary | ICD-10-CM | POA: Diagnosis not present

## 2018-03-16 DIAGNOSIS — J449 Chronic obstructive pulmonary disease, unspecified: Secondary | ICD-10-CM | POA: Diagnosis not present

## 2018-03-16 DIAGNOSIS — R062 Wheezing: Secondary | ICD-10-CM | POA: Diagnosis not present

## 2018-03-16 DIAGNOSIS — J440 Chronic obstructive pulmonary disease with acute lower respiratory infection: Secondary | ICD-10-CM | POA: Diagnosis not present

## 2018-03-16 DIAGNOSIS — M6281 Muscle weakness (generalized): Secondary | ICD-10-CM | POA: Diagnosis not present

## 2018-03-16 DIAGNOSIS — R0602 Shortness of breath: Secondary | ICD-10-CM | POA: Diagnosis not present

## 2018-03-16 DIAGNOSIS — I509 Heart failure, unspecified: Secondary | ICD-10-CM | POA: Diagnosis not present

## 2018-03-17 ENCOUNTER — Emergency Department (HOSPITAL_COMMUNITY)
Admission: EM | Admit: 2018-03-17 | Discharge: 2018-03-18 | Disposition: A | Discharge status: Home / Self Care | Payer: Medicare HMO | Attending: Emergency Medicine | Admitting: Emergency Medicine

## 2018-03-17 ENCOUNTER — Other Ambulatory Visit: Payer: Self-pay

## 2018-03-17 ENCOUNTER — Emergency Department (HOSPITAL_COMMUNITY): Payer: Medicare HMO

## 2018-03-17 ENCOUNTER — Encounter (HOSPITAL_COMMUNITY): Payer: Self-pay | Admitting: Emergency Medicine

## 2018-03-17 DIAGNOSIS — I130 Hypertensive heart and chronic kidney disease with heart failure and stage 1 through stage 4 chronic kidney disease, or unspecified chronic kidney disease: Secondary | ICD-10-CM | POA: Diagnosis not present

## 2018-03-17 DIAGNOSIS — N898 Other specified noninflammatory disorders of vagina: Secondary | ICD-10-CM | POA: Diagnosis not present

## 2018-03-17 DIAGNOSIS — Z79899 Other long term (current) drug therapy: Secondary | ICD-10-CM | POA: Insufficient documentation

## 2018-03-17 DIAGNOSIS — Z7982 Long term (current) use of aspirin: Secondary | ICD-10-CM | POA: Insufficient documentation

## 2018-03-17 DIAGNOSIS — M79641 Pain in right hand: Secondary | ICD-10-CM | POA: Diagnosis not present

## 2018-03-17 DIAGNOSIS — N183 Chronic kidney disease, stage 3 (moderate): Secondary | ICD-10-CM | POA: Diagnosis not present

## 2018-03-17 DIAGNOSIS — Z87891 Personal history of nicotine dependence: Secondary | ICD-10-CM | POA: Diagnosis not present

## 2018-03-17 DIAGNOSIS — Y92410 Unspecified street and highway as the place of occurrence of the external cause: Secondary | ICD-10-CM | POA: Insufficient documentation

## 2018-03-17 DIAGNOSIS — I5032 Chronic diastolic (congestive) heart failure: Secondary | ICD-10-CM | POA: Diagnosis not present

## 2018-03-17 DIAGNOSIS — S62326A Displaced fracture of shaft of fifth metacarpal bone, right hand, initial encounter for closed fracture: Secondary | ICD-10-CM | POA: Diagnosis not present

## 2018-03-17 DIAGNOSIS — Y999 Unspecified external cause status: Secondary | ICD-10-CM | POA: Insufficient documentation

## 2018-03-17 DIAGNOSIS — J45909 Unspecified asthma, uncomplicated: Secondary | ICD-10-CM | POA: Diagnosis not present

## 2018-03-17 DIAGNOSIS — S60921A Unspecified superficial injury of right hand, initial encounter: Secondary | ICD-10-CM | POA: Diagnosis present

## 2018-03-17 DIAGNOSIS — E1122 Type 2 diabetes mellitus with diabetic chronic kidney disease: Secondary | ICD-10-CM | POA: Diagnosis not present

## 2018-03-17 DIAGNOSIS — Y939 Activity, unspecified: Secondary | ICD-10-CM | POA: Insufficient documentation
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
## 2018-03-18 ENCOUNTER — Ambulatory Visit: Payer: Self-pay | Admitting: Family Medicine

## 2018-03-18 DIAGNOSIS — R4781 Slurred speech: Secondary | ICD-10-CM | POA: Diagnosis not present

## 2018-03-18 DIAGNOSIS — I130 Hypertensive heart and chronic kidney disease with heart failure and stage 1 through stage 4 chronic kidney disease, or unspecified chronic kidney disease: Secondary | ICD-10-CM | POA: Diagnosis not present

## 2018-03-18 DIAGNOSIS — N183 Chronic kidney disease, stage 3 (moderate): Secondary | ICD-10-CM | POA: Diagnosis not present

## 2018-03-18 DIAGNOSIS — S2242XD Multiple fractures of ribs, left side, subsequent encounter for fracture with routine healing: Secondary | ICD-10-CM | POA: Diagnosis not present

## 2018-03-18 DIAGNOSIS — E1122 Type 2 diabetes mellitus with diabetic chronic kidney disease: Secondary | ICD-10-CM | POA: Diagnosis not present

## 2018-03-18 DIAGNOSIS — J449 Chronic obstructive pulmonary disease, unspecified: Secondary | ICD-10-CM | POA: Diagnosis not present

## 2018-03-18 DIAGNOSIS — G931 Anoxic brain damage, not elsewhere classified: Secondary | ICD-10-CM | POA: Diagnosis not present

## 2018-03-18 DIAGNOSIS — I5032 Chronic diastolic (congestive) heart failure: Secondary | ICD-10-CM | POA: Diagnosis not present

## 2018-03-18 DIAGNOSIS — F3164 Bipolar disorder, current episode mixed, severe, with psychotic features: Secondary | ICD-10-CM | POA: Diagnosis not present

## 2018-03-18 LAB — URINALYSIS, ROUTINE W REFLEX MICROSCOPIC
Bilirubin Urine: NEGATIVE
Glucose, UA: NEGATIVE mg/dL
Hgb urine dipstick: NEGATIVE
Ketones, ur: NEGATIVE mg/dL
Leukocytes,Ua: NEGATIVE
Nitrite: NEGATIVE
Protein, ur: NEGATIVE mg/dL
Specific Gravity, Urine: 1.004 — ABNORMAL LOW (ref 1.005–1.030)
pH: 5 (ref 5.0–8.0)

## 2018-03-18 MED ORDER — MICONAZOLE NITRATE 2 % VA CREA: 1.0000 | TOPICAL_CREAM | Freq: Every day | VAGINAL | 0 refills | Status: DC

## 2018-03-18 NOTE — Telephone Encounter (Signed)
Attempted to contact patient x3, phone line just keeps ringing busy. Will hold in triage and attempt to call back later.

## 2018-03-18 NOTE — Telephone Encounter (Signed)
ATC Patient.  Phone rings twice, then goes to busy signal.  Unable to leave message.

## 2018-03-18 NOTE — Discharge Instructions (Signed)
There is a small fracture in your hand.  Please leave the splint on.  You will need to follow-up with your orthopedist about this, call his office in the morning. I have refilled your vaginal cream.  Urine test was normal. Follow-up with your doctor if this continues. Return here for any new/acute changes.

## 2018-03-18 NOTE — Telephone Encounter (Signed)
Patient returning phone call.  Patient phone number is [phone].

## 2018-03-19 DIAGNOSIS — J9611 Chronic respiratory failure with hypoxia: Secondary | ICD-10-CM | POA: Diagnosis not present

## 2018-03-19 NOTE — Telephone Encounter (Signed)
Called pt and phone rings and then a busy signal. Will try again later.

## 2018-03-20 DIAGNOSIS — J9611 Chronic respiratory failure with hypoxia: Secondary | ICD-10-CM | POA: Diagnosis not present

## 2018-03-20 NOTE — Telephone Encounter (Signed)
ATC, NA and closing per protocol

## 2018-03-21 ENCOUNTER — Ambulatory Visit (INDEPENDENT_AMBULATORY_CARE_PROVIDER_SITE_OTHER): Payer: Medicare HMO | Admitting: Obstetrics and Gynecology

## 2018-03-21 ENCOUNTER — Other Ambulatory Visit (HOSPITAL_COMMUNITY)
Admission: RE | Admit: 2018-03-21 | Discharge: 2018-03-21 | Disposition: A | Discharge status: Home / Self Care | Payer: Medicare HMO | Source: Ambulatory Visit | Attending: Obstetrics and Gynecology | Admitting: Obstetrics and Gynecology

## 2018-03-21 ENCOUNTER — Ambulatory Visit (INDEPENDENT_AMBULATORY_CARE_PROVIDER_SITE_OTHER): Payer: Medicare HMO | Admitting: Orthopaedic Surgery

## 2018-03-21 ENCOUNTER — Encounter (INDEPENDENT_AMBULATORY_CARE_PROVIDER_SITE_OTHER): Payer: Self-pay | Admitting: Orthopaedic Surgery

## 2018-03-21 ENCOUNTER — Encounter: Payer: Self-pay | Admitting: Obstetrics and Gynecology

## 2018-03-21 VITALS — BP 108/79 | HR 89 | Wt 159.3 lb

## 2018-03-21 DIAGNOSIS — N898 Other specified noninflammatory disorders of vagina: Secondary | ICD-10-CM | POA: Diagnosis present

## 2018-03-21 DIAGNOSIS — R300 Dysuria: Secondary | ICD-10-CM | POA: Diagnosis not present

## 2018-03-21 DIAGNOSIS — N762 Acute vulvitis: Secondary | ICD-10-CM | POA: Diagnosis not present

## 2018-03-21 DIAGNOSIS — S62308D Unspecified fracture of other metacarpal bone, subsequent encounter for fracture with routine healing: Secondary | ICD-10-CM

## 2018-03-21 DIAGNOSIS — S62308A Unspecified fracture of other metacarpal bone, initial encounter for closed fracture: Secondary | ICD-10-CM

## 2018-03-21 DIAGNOSIS — K625 Hemorrhage of anus and rectum: Secondary | ICD-10-CM | POA: Diagnosis not present

## 2018-03-21 DIAGNOSIS — J9611 Chronic respiratory failure with hypoxia: Secondary | ICD-10-CM | POA: Diagnosis not present

## 2018-03-21 HISTORY — DX: Unspecified fracture of other metacarpal bone, initial encounter for closed fracture: S62.308A

## 2018-03-21 MED ORDER — TERCONAZOLE 0.8 % VA CREA: 1.0000 | TOPICAL_CREAM | Freq: Every day | VAGINAL | 0 refills | Status: DC
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
## 2018-03-22 ENCOUNTER — Ambulatory Visit: Payer: Self-pay | Admitting: Primary Care

## 2018-03-22 ENCOUNTER — Telehealth: Payer: Self-pay

## 2018-03-22 ENCOUNTER — Other Ambulatory Visit: Payer: Medicare HMO | Admitting: Internal Medicine

## 2018-03-22 DIAGNOSIS — M6281 Muscle weakness (generalized): Secondary | ICD-10-CM | POA: Diagnosis not present

## 2018-03-22 DIAGNOSIS — Z515 Encounter for palliative care: Secondary | ICD-10-CM

## 2018-03-22 DIAGNOSIS — J449 Chronic obstructive pulmonary disease, unspecified: Secondary | ICD-10-CM | POA: Diagnosis not present

## 2018-03-22 DIAGNOSIS — J45909 Unspecified asthma, uncomplicated: Secondary | ICD-10-CM | POA: Diagnosis not present

## 2018-03-22 DIAGNOSIS — J9611 Chronic respiratory failure with hypoxia: Secondary | ICD-10-CM | POA: Diagnosis not present

## 2018-03-22 DIAGNOSIS — R062 Wheezing: Secondary | ICD-10-CM | POA: Diagnosis not present

## 2018-03-22 NOTE — Telephone Encounter (Signed)
Called Pt to inform her of Dr. Ethelene Hal answer of his comfort level of the R mds that she was requesting. Pt requested referral to Psychiatry for her visit 03/25/2018. Call completed.

## 2018-03-22 NOTE — Telephone Encounter (Signed)
Pt has appt 03/25/2018 with you. Please advise.     Copied from [place] [id number]. Topic: General - Other >> Mar 21, 2018  5:11 PM Ivar Drape wrote: Reason for CRM:   New Patient would like to know if Dr. Ethelene Hal prescribes 1) Valium  2) Vyvanse  3) Trazodone

## 2018-03-22 NOTE — Progress Notes (Signed)
    [facility] Consult Note Telephone: [phone]  Fax: [phone]  PATIENT NAME: Tiffany [name] DOB: [birth year] MRN: [id number]  PRIMARY CARE PROVIDER:   Zenia Resides, MD  REFERRING PROVIDER:  Zenia Resides, MD [place], [place] [address]  RESPONSIBLE PARTY:   self  RECOMMENDATIONS and PLAN:  Palliative care encounter Z51.5  1.  Shortness of breath:  Related to chronic CHF with hypoxia.  At baseline.  Continue home O2 therapy. Motorized chair to decrease level of exertion. Heart failure management encouraged.     2.  Depression:  At baseline managed by Psychiatrist and Psychologist   3.  Recurrent falls:  Related to unsteady gait and deconditioning.  Fall risk prevention reviewed.  Motorized chair use encouraged  4.  Advance care planning:  Remains as full code and full scope of treatment.  Goals is to continue independent living with some assistance from home aid.  Palliative Care will continue to f/u with pt in 3-4 weeks  I spent 45 minutes providing this consultation,  from 1200 to 1245 at home. More than 50% of the time in this consultation was spent coordinating communication with patient.   HISTORY OF PRESENT ILLNESS: Follow-up with Tiffany [name]. No reports of acute illness.  She remains independent at home  Palliative Care was asked to help address goals of care.   CODE STATUS: Full Code and full scope of treatment  PPS: 50% HOSPICE ELIGIBILITY/DIAGNOSIS: TBD  PAST MEDICAL HISTORY:  Past Medical History:  Diagnosis Date  . Anxiety   . Arthritis    "all over" (04/10/2016)  . Asthma   . Cardiac arrest ([place]) 09/08/2016   PEA  . Carotid artery stenosis    1-39% bilateral by dopplers 11/2016  . Chronic bronchitis ([place])   . Chronic diastolic (congestive) heart failure ([place])   . Chronic kidney disease    "I see a kidney dr." (04/10/2016)  . Cocaine abuse ([place])   . Complication of  anesthesia    decreased bp, decreased heart rate  . Depression   . Disorder of nervous system   . Emphysema   . GERD (gastroesophageal reflux disease)   . Heart attack ([place]) 1980s  . History of blood transfusion 1994   "couldn't stop bleeding from my period"  . Hyperlipidemia LDL goal <70   . Hypertension   . Incontinence   . Manic depression ([place])   . On home oxygen therapy    "6L; 24/7" (04/10/2016)  . OSA on CPAP    "wear mask sometimes" (04/10/2016)  . Paranoid ([place])    "sometimes; I'm on RX for it" (04/10/2016)  . Pneumonia    "I've had it several times; haven't had it since 06/2015" (04/10/2016)  . Schizophrenia ([place])   . Seasonal allergies   . Seizures ([place])    "don't know what kind; last one was ~ 1 yr ago" (04/10/2016)  . Sinus trouble   . Stroke [facility]) 1980s   denies residual on 04/10/2016  . Type II diabetes mellitus (HCC)      PHYSICAL EXAM:   General: Middle aged female in NAD Cardiovascular: regular rate and rhythm Pulmonary: without increased work of breathing, O2 via [place] in use Abdomen: soft, nontender, + bowel sounds Extremities: trace edema RLE Skin: exposed skin is intact Neurological: A&O x3 Psych: calm and cooperative.   Gonzella Lex, NP

## 2018-03-22 NOTE — Telephone Encounter (Signed)
No, I would not be comfortable with that combination. Thank you for asking.

## 2018-03-23 DIAGNOSIS — J9611 Chronic respiratory failure with hypoxia: Secondary | ICD-10-CM | POA: Diagnosis not present

## 2018-03-24 DIAGNOSIS — J9611 Chronic respiratory failure with hypoxia: Secondary | ICD-10-CM | POA: Diagnosis not present

## 2018-03-25 ENCOUNTER — Encounter: Payer: Self-pay | Admitting: Family Medicine

## 2018-03-25 ENCOUNTER — Encounter: Payer: Self-pay | Admitting: Primary Care

## 2018-03-25 ENCOUNTER — Ambulatory Visit (INDEPENDENT_AMBULATORY_CARE_PROVIDER_SITE_OTHER): Payer: Medicare HMO | Admitting: Family Medicine

## 2018-03-25 ENCOUNTER — Ambulatory Visit (INDEPENDENT_AMBULATORY_CARE_PROVIDER_SITE_OTHER): Payer: Medicare HMO | Admitting: Primary Care

## 2018-03-25 VITALS — BP 126/80 | HR 71 | Ht 64.0 in | Wt 159.0 lb

## 2018-03-25 VITALS — BP 110/70 | HR 79 | Ht 64.0 in | Wt 159.0 lb

## 2018-03-25 DIAGNOSIS — N898 Other specified noninflammatory disorders of vagina: Secondary | ICD-10-CM

## 2018-03-25 DIAGNOSIS — G4733 Obstructive sleep apnea (adult) (pediatric): Secondary | ICD-10-CM | POA: Diagnosis not present

## 2018-03-25 DIAGNOSIS — F3164 Bipolar disorder, current episode mixed, severe, with psychotic features: Secondary | ICD-10-CM | POA: Diagnosis not present

## 2018-03-25 DIAGNOSIS — J9611 Chronic respiratory failure with hypoxia: Secondary | ICD-10-CM | POA: Diagnosis not present

## 2018-03-25 DIAGNOSIS — R062 Wheezing: Secondary | ICD-10-CM | POA: Diagnosis not present

## 2018-03-25 DIAGNOSIS — J9612 Chronic respiratory failure with hypercapnia: Secondary | ICD-10-CM

## 2018-03-25 DIAGNOSIS — J42 Unspecified chronic bronchitis: Secondary | ICD-10-CM

## 2018-03-25 DIAGNOSIS — R0602 Shortness of breath: Secondary | ICD-10-CM | POA: Diagnosis not present

## 2018-03-25 DIAGNOSIS — J45909 Unspecified asthma, uncomplicated: Secondary | ICD-10-CM | POA: Diagnosis not present

## 2018-03-25 LAB — CERVICOVAGINAL ANCILLARY ONLY
Bacterial vaginitis: POSITIVE — AB
Candida vaginitis: NEGATIVE
Chlamydia: NEGATIVE
Neisseria Gonorrhea: NEGATIVE
Trichomonas: NEGATIVE

## 2018-03-25 MED ORDER — TERCONAZOLE 0.8 % VA CREA: 1.0000 | TOPICAL_CREAM | Freq: Every day | VAGINAL | 0 refills | Status: DC

## 2018-03-25 MED ORDER — ALBUTEROL SULFATE (2.5 MG/3ML) 0.083% IN NEBU: 2.5000 mg | INHALATION_SOLUTION | Freq: Four times a day (QID) | RESPIRATORY_TRACT | 1 refills | Status: DC | PRN

## 2018-03-25 MED ORDER — FLUTICASONE-UMECLIDIN-VILANT 100-62.5-25 MCG/INH IN AEPB: 1.0000 | INHALATION_SPRAY | Freq: Every day | RESPIRATORY_TRACT | 3 refills | Status: DC

## 2018-03-25 MED ORDER — METRONIDAZOLE 0.75 % VA GEL: 1.0000 | Freq: Every day | VAGINAL | 0 refills | Status: DC

## 2018-03-25 NOTE — Assessment & Plan Note (Addendum)
-   Continue Trilogy ventilator at night, needs new nasal mask - Ambulatory O2 88% RA, 2L pulsed 100% after 1 lap - Qualified for POC, needs to change DME company to [facility] ([facility] does not have POCs)   - FU with Dr. Lamonte Sakai in August

## 2018-03-25 NOTE — Progress Notes (Signed)
Established Patient Office Visit  Subjective:  Patient ID: Tiffany [name], female    DOB: 1960/06/24  Age: 58 y.o. MRN: [id number]  CC:  Chief Complaint  Patient presents with  . Establish Care    HPI Tiffany [name] presents for establishment of care in need of referral for psychiatric follow-up for chronic medical illness.  Patient is on multiple psychotropics.  History of illicit drug dependence and abuse.  Recently seen by her GYN provider but ran out of the Terazol that have been provided.   Past Medical History:  Diagnosis Date  . Anxiety   . Arthritis    "all over" (04/10/2016)  . Asthma   . Cardiac arrest ([place]) 09/08/2016   PEA  . Carotid artery stenosis    1-39% bilateral by dopplers 11/2016  . Chronic bronchitis ([place])   . Chronic diastolic (congestive) heart failure ([place])   . Chronic kidney disease    "I see a kidney dr." (04/10/2016)  . Cocaine abuse ([place])   . Complication of anesthesia    decreased bp, decreased heart rate  . Depression   . Disorder of nervous system   . Emphysema   . GERD (gastroesophageal reflux disease)   . Heart attack ([place]) 1980s  . History of blood transfusion 1994   "couldn't stop bleeding from my period"  . Hyperlipidemia LDL goal <70   . Hypertension   . Incontinence   . Manic depression ([place])   . On home oxygen therapy    "6L; 24/7" (04/10/2016)  . OSA on CPAP    "wear mask sometimes" (04/10/2016)  . Paranoid ([place])    "sometimes; I'm on RX for it" (04/10/2016)  . Pneumonia    "I've had it several times; haven't had it since 06/2015" (04/10/2016)  . Schizophrenia ([place])   . Seasonal allergies   . Seizures ([place])    "don't know what kind; last one was ~ 1 yr ago" (04/10/2016)  . Sinus trouble   . Stroke [facility]) 1980s   denies residual on 04/10/2016  . Type II diabetes mellitus ([place])     Past Surgical History:  Procedure Laterality Date  . CESAREAN SECTION  1997  . HERNIA REPAIR    . IR CHOLANGIOGRAM EXISTING TUBE  07/20/2016  .  IR PERC CHOLECYSTOSTOMY  05/10/2016  . IR RADIOLOGIST EVAL & MGMT  06/08/2016  . IR RADIOLOGIST EVAL & MGMT  06/29/2016  . IR SINUS/FIST TUBE CHK-NON GI  07/12/2016  . RIGHT/LEFT HEART CATH AND CORONARY ANGIOGRAPHY N/A 06/19/2017   Procedure: RIGHT/LEFT HEART CATH AND CORONARY ANGIOGRAPHY;  Surgeon: Jolaine Artist, MD;  Location: [facility];  Service: Cardiovascular;  Laterality: N/A;  . TIBIA IM NAIL INSERTION Right 07/12/2016   Procedure: INTRAMEDULLARY (IM) NAIL RIGHT TIBIA;  Surgeon: Leandrew Koyanagi, MD;  Location: [facility];  Service: Orthopedics;  Laterality: Right;  . UMBILICAL HERNIA REPAIR  ~ 1963   "that's why I don't have a belly button"  . VAGINAL HYSTERECTOMY      Family History  Problem Relation Age of Onset  . Cancer Father        prostate  . Cancer Mother        lung  . Depression Mother   . Depression Sister   . Anxiety disorder Sister   . Schizophrenia Sister   . Bipolar disorder Sister   . Depression Sister   . Depression Brother   . Heart failure Other  cousin    Social History   Socioeconomic History  . Marital status: Widowed    Spouse name: Not on file  . Number of children: 3  . Years of education: Not on file  . Highest education level: Not on file  Occupational History  . 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 Financial resource strain: Not on file  . Food insecurity:    Worry: Sometimes true    Inability: Never true  . Transportation needs:    Medical: No    Non-medical: No  Tobacco Use  . Smoking status: Former Smoker    Packs/day: 1.50    Years: 38.00    Pack years: 57.00    Types: Cigarettes    Start date: 03/13/1977    Last attempt to quit: 04/10/2016    Years since quitting: 1.9  . Smokeless tobacco: Never Used  Substance and Sexual Activity  . Alcohol use: No    Alcohol/week: 0.0 standard drinks  . Drug use: No    Types: Cocaine    Comment: 04/10/2016 "last used cocaine back in November  2017"  . Sexual activity: Not Currently    Birth control/protection: Surgical  Lifestyle  . Physical activity:    Days per week: Not on file    Minutes per session: Not on file  . Stress: Not on file  Relationships  . Social connections:    Talks on phone: Not on file    Gets together: Not on file    Attends religious service: Not on file    Active member of club or organization: Not on file    Attends meetings of clubs or organizations: Not on file    Relationship status: Not on file  . Intimate partner violence:    Fear of current or ex partner: Not on file    Emotionally abused: Not on file    Physically abused: Not on file    Forced sexual activity: Not on file  Other Topics Concern  . Not on file  Social History Narrative   Has 1 son, [name]   Lives with son and his boyfriend   Her house has ramps and handrails should she ever needs them.    Her mother lives down the street from her and is a good support person in addition to her son.   She drives herself, has private transportation.    Cocaine free since 02/24/16, smoke free since 04/10/16    Outpatient Medications Prior to Visit  Medication Sig Dispense Refill  . albuterol (PROVENTIL) (2.5 MG/3ML) 0.083% nebulizer solution Take 3 mLs (2.5 mg total) by nebulization every 6 (six) hours as needed for wheezing or shortness of breath. 150 mL 1  . amLODipine (NORVASC) 5 MG tablet Take 5 mg by mouth daily.    [name] aspirin (GOODSENSE ASPIRIN LOW DOSE) 81 MG EC tablet TAKE 1 TABLET (81 MG TOTAL) BY MOUTH DAILY (MORNING). (Patient taking differently: Take 81 mg by mouth daily. ) 90 tablet 3  . colchicine (COLCRYS) 0.6 MG tablet TAKE 1 TABLET (0.6 MG TOTAL) BY MOUTH 2 (TWO) TIMES DAILY. (Patient taking differently: Take 0.6 mg by mouth 2 (two) times daily. ) 180 tablet 3  . diazepam (VALIUM) 10 MG tablet Take 10 mg by mouth 3 (three) times daily.  1  . diclofenac sodium (VOLTAREN) 1 % GEL Apply 2 g topically 4 (four) times daily. (Patient  taking differently: Apply 2 g topically 4 (four) times daily  as needed (pain). ) 100 g 0  . escitalopram (LEXAPRO) 20 MG tablet Take 20 mg by mouth daily.    . Febuxostat (ULORIC) 80 MG TABS Take 1 tablet (80 mg total) by mouth daily. 90 tablet 3  . ferrous sulfate (FEROSUL) 325 (65 FE) MG tablet TAKE 1 TABLET BY MOUTH DAILY.(MORNING) (Patient taking differently: Take 325 mg by mouth daily with breakfast. ) 90 tablet 3  . flunisolide (NASALIDE) 25 MCG/ACT (0.025%) SOLN Place 2 sprays into the nose daily. 1 Bottle 2  . Fluticasone-Umeclidin-Vilant (TRELEGY ELLIPTA) 100-62.5-25 MCG/INH AEPB Inhale 1 puff into the lungs daily. 60 each 3  . gabapentin (NEURONTIN) 600 MG tablet Take 1 tablet (600 mg total) by mouth 2 (two) times daily. (Patient taking differently: Take 1,200 mg by mouth 3 (three) times daily. ) 360 tablet 0  . lidocaine (LIDODERM) 5 % Place 1 patch onto the skin daily. Remove & Discard patch within 12 hours or as directed by MD 30 patch 0  . lisdexamfetamine (VYVANSE) 70 MG capsule Take 1 capsule (70 mg total) by mouth daily. 30 capsule 0  . metolazone (ZAROXOLYN) 2.5 MG tablet TAKE 1 TABLET BY MOUTH EVERY MONDAY, WEDNESDAY, AND FRIDAY. TAKE 30 MINS PRIOR TO TAKING TORSEMIDE (Patient taking differently: Take 2.5 mg by mouth See admin instructions. MWF 30 mins prior to taking torsemide) 45 tablet 0  . miconazole (MICONAZOLE 7) 2 % vaginal cream Place 1 Applicatorful vaginally at bedtime. 45 g 0  . montelukast (SINGULAIR) 10 MG tablet Take 1 tablet (10 mg total) by mouth at bedtime. 90 tablet 3  . Multiple Vitamin (MULTIVITAMIN WITH MINERALS) TABS tablet Take 1 tablet by mouth daily.    [name] omeprazole (PRILOSEC) 40 MG capsule Take 1 capsule (40 mg total) by mouth 2 (two) times daily. 180 capsule 3  . oxybutynin (DITROPAN-XL) 10 MG 24 hr tablet TAKE 1 TABLET BY MOUTH EVERYDAY AT BEDTIME (Patient taking differently: Take 10 mg by mouth at bedtime. ) 90 tablet 3  . potassium chloride (KLOR-CON  M10) 10 MEQ tablet Take 4 tablets (40 mEq total) by mouth daily. (Patient taking differently: Take 30 mEq by mouth 2 (two) times daily. ) 90 tablet 0  . SAPHRIS 2.5 MG SUBL Take 2.5 mg by mouth 3 (three) times daily.     [name] torsemide (DEMADEX) 20 MG tablet Take 4 tablets (80 mg total) by mouth 2 (two) times daily. 240 tablet 4  . traZODone (DESYREL) 100 MG tablet 50 mg in the AM and 150 mg at night (Patient taking differently: Take 100 mg by mouth at bedtime. 50 mg in the AM and 150 mg at night)    . zinc oxide 20 % ointment Apply 1 application topically as needed for irritation. 56.7 g 5  . terconazole (TERAZOL 3) 0.8 % vaginal cream Place 1 applicator vaginally at bedtime. Apply pea sized amount to clitoris nightly. 20 g 0   No facility-administered medications prior to visit.     Allergies  Allergen Reactions  . Hydrocodone-Acetaminophen Shortness Of Breath  . Hydroxyzine Anaphylaxis and Shortness Of Breath  . Latuda [Lurasidone Hcl] Anaphylaxis  . Magnesium-Containing Compounds Anaphylaxis  . Prednisone Anaphylaxis, Swelling and Other (See Comments)    Tongue swelling  . Tramadol Anaphylaxis  . Codeine Nausea And Vomiting  . Sulfa Antibiotics Itching  . Tape Rash    ROS Review of Systems  Constitutional: Negative for diaphoresis, fatigue, fever and unexpected weight change.  HENT: Negative.   Respiratory: Positive for  shortness of breath.   Cardiovascular: Negative for chest pain and leg swelling.  Gastrointestinal: Negative.   Endocrine: Negative for polyphagia and polyuria.  Musculoskeletal: Positive for gait problem.  Skin: Negative for color change and pallor.  Allergic/Immunologic: Negative for immunocompromised state.  Hematological: Does not bruise/bleed easily.  Psychiatric/Behavioral: Positive for behavioral problems and dysphoric mood. The patient is nervous/anxious.       Objective:    Physical Exam  Constitutional: [name] oriented to person, place, and time.  She appears well-developed and well-nourished. No distress.  HENT:  Head: Normocephalic and atraumatic.  Right Ear: External ear normal.  Left Ear: External ear normal.  Eyes: Right eye exhibits no discharge. Left eye exhibits no discharge. No scleral icterus.  Neck: No JVD present. No tracheal deviation present.  Cardiovascular: Normal rate, regular rhythm and normal heart sounds.  Pulmonary/Chest: Effort normal and breath sounds normal. No stridor.  Neurological: [name] alert and oriented to person, place, and time.  Skin: Skin is warm and dry. [name] not diaphoretic.  Psychiatric: She has a normal mood and affect. Her speech is tangential. [name] slowed.    BP 126/80   Pulse 71   Ht 5\' 4"  (1.626 m)   Wt 159 lb (72.1 kg)   SpO2 98%   BMI 27.29 kg/m  Wt Readings from Last 3 Encounters:  03/25/18 159 lb (72.1 kg)  03/25/18 159 lb (72.1 kg)  03/21/18 159 lb 4.8 oz (72.3 kg)   BP Readings from Last 3 Encounters:  03/25/18 126/80  03/25/18 110/70  03/21/18 108/79   Guideline developer:  UpToDate (see UpToDate for funding source) Date Released: June 2014  Health Maintenance Due  Topic Date Due  . COLONOSCOPY  05/10/2009  . FOOT EXAM  03/12/2018  . URINE MICROALBUMIN  03/12/2018    There are no preventive care reminders to display for this patient.  Lab Results  Component Value Date   TSH 0.563 03/15/2016   Lab Results  Component Value Date   WBC 4.9 02/27/2018   HGB 9.3 (L) 02/27/2018   HCT 30.8 (L) 02/27/2018   MCV 94.2 02/27/2018   PLT 367 02/27/2018   Lab Results  Component Value Date   NA 140 02/27/2018   K 3.2 (L) 02/27/2018   CO2 22 02/27/2018   GLUCOSE 78 02/27/2018   BUN 15 02/27/2018   CREATININE 2.38 (H) 02/27/2018   BILITOT 0.4 12/25/2017   ALKPHOS 186 (H) 12/25/2017   AST 28 12/25/2017   ALT 23 12/25/2017   PROT 6.2 (L) 12/25/2017   ALBUMIN 3.0 (L) 12/25/2017   CALCIUM 8.9 02/27/2018   ANIONGAP 10 02/27/2018   Lab Results  Component  Value Date   CHOL 129 01/07/2017   Lab Results  Component Value Date   HDL 35 (L) 01/07/2017   Lab Results  Component Value Date   LDLCALC 56 01/07/2017   Lab Results  Component Value Date   TRIG 121 11/21/2017   Lab Results  Component Value Date   CHOLHDL 3.7 01/07/2017   Lab Results  Component Value Date   HGBA1C 5.3 10/11/2017      Assessment & Plan:   Problem List Items Addressed This Visit      Other   Bipolar affective disorder, mixed, severe, with psychotic behavior ([place]) - Primary   Relevant Orders   Ambulatory referral to Psychiatry   Vaginal discharge   Relevant Medications   terconazole (TERAZOL 3) 0.8 % vaginal cream  Meds ordered this encounter  Medications  . terconazole (TERAZOL 3) 0.8 % vaginal cream    Sig: Place 1 applicator vaginally at bedtime. Apply pea sized amount to clitoris nightly.    Dispense:  20 g    Refill:  0    Follow-up: Return in about 1 month (around 04/25/2018).   Patient was advised to follow-up with her GYN provider.

## 2018-03-25 NOTE — Addendum Note (Signed)
Addended by: Karmen Stabs on: 03/25/2018 12:28 PM   Modules accepted: Orders

## 2018-03-25 NOTE — Patient Instructions (Signed)
Psychiatry will call you to schedule an appointment. This is their phone number: [phone].

## 2018-03-25 NOTE — Patient Instructions (Addendum)
Continue Trilogy ventilatory at night Needs nasal mask  Change oxygen supply company to [employer] or [facility] Qualified for POC Needs 2L pulsed oxygen with exertion   FU with Dr. Lamonte Sakai in August

## 2018-03-25 NOTE — Addendum Note (Signed)
Addended by: Vivien Rota on: 03/25/2018 08:06 PM   Modules accepted: Orders
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
## 2018-03-26 ENCOUNTER — Telehealth: Payer: Self-pay | Admitting: Family Medicine

## 2018-03-26 ENCOUNTER — Encounter (HOSPITAL_COMMUNITY): Payer: Self-pay | Admitting: Psychiatry

## 2018-03-26 ENCOUNTER — Ambulatory Visit (INDEPENDENT_AMBULATORY_CARE_PROVIDER_SITE_OTHER): Payer: Medicare HMO | Admitting: Psychiatry

## 2018-03-26 ENCOUNTER — Telehealth: Payer: Self-pay

## 2018-03-26 DIAGNOSIS — N898 Other specified noninflammatory disorders of vagina: Secondary | ICD-10-CM

## 2018-03-26 DIAGNOSIS — J9611 Chronic respiratory failure with hypoxia: Secondary | ICD-10-CM | POA: Diagnosis not present

## 2018-03-26 DIAGNOSIS — F250 Schizoaffective disorder, bipolar type: Secondary | ICD-10-CM | POA: Diagnosis not present

## 2018-03-26 MED ORDER — TERCONAZOLE 0.8 % VA CREA: TOPICAL_CREAM | VAGINAL | 0 refills | Status: DC

## 2018-03-26 MED ORDER — METRONIDAZOLE 0.75 % VA GEL: 1.0000 | Freq: Every day | VAGINAL | 0 refills | Status: DC

## 2018-03-26 NOTE — Telephone Encounter (Signed)
Rx corrected and resent.  

## 2018-03-26 NOTE — Telephone Encounter (Signed)
Received a fax from the pharmacy. They need clarification on the Terconazole cream. Should the directions be place 1 applicator vaginally qhs or apply pea size amount to clitoris at night?

## 2018-03-26 NOTE — Telephone Encounter (Addendum)
-----   Message from Sloan Leiter, MD sent at 03/25/2018  8:06 PM EDT ----- Please inform patient of +BV, I sent metrogel vaginal to the [facility] mail order pharmacy in chart.  Notified pt results and treatment sent to her pharmacy.  Pt requested for the medication to be sent to her [facility] on [address].  Metrogel re-prescribed as pt requested.

## 2018-03-26 NOTE — Addendum Note (Signed)
Addended by: Nathanial Millman E on: 03/26/2018 11:21 AM   Modules accepted: Orders
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
## 2018-03-26 NOTE — Telephone Encounter (Signed)
Pea sized amount placed on the clitoris nightly.

## 2018-03-27 DIAGNOSIS — R4781 Slurred speech: Secondary | ICD-10-CM | POA: Diagnosis not present

## 2018-03-27 DIAGNOSIS — J9611 Chronic respiratory failure with hypoxia: Secondary | ICD-10-CM | POA: Diagnosis not present

## 2018-03-27 DIAGNOSIS — N183 Chronic kidney disease, stage 3 (moderate): Secondary | ICD-10-CM | POA: Diagnosis not present

## 2018-03-27 DIAGNOSIS — F3164 Bipolar disorder, current episode mixed, severe, with psychotic features: Secondary | ICD-10-CM | POA: Diagnosis not present

## 2018-03-27 DIAGNOSIS — I5032 Chronic diastolic (congestive) heart failure: Secondary | ICD-10-CM | POA: Diagnosis not present

## 2018-03-27 DIAGNOSIS — J449 Chronic obstructive pulmonary disease, unspecified: Secondary | ICD-10-CM | POA: Diagnosis not present

## 2018-03-27 DIAGNOSIS — S2242XD Multiple fractures of ribs, left side, subsequent encounter for fracture with routine healing: Secondary | ICD-10-CM | POA: Diagnosis not present

## 2018-03-27 DIAGNOSIS — E1122 Type 2 diabetes mellitus with diabetic chronic kidney disease: Secondary | ICD-10-CM | POA: Diagnosis not present

## 2018-03-27 DIAGNOSIS — I130 Hypertensive heart and chronic kidney disease with heart failure and stage 1 through stage 4 chronic kidney disease, or unspecified chronic kidney disease: Secondary | ICD-10-CM | POA: Diagnosis not present

## 2018-03-27 DIAGNOSIS — G931 Anoxic brain damage, not elsewhere classified: Secondary | ICD-10-CM | POA: Diagnosis not present

## 2018-03-28 ENCOUNTER — Telehealth: Payer: Self-pay

## 2018-03-28 DIAGNOSIS — J9611 Chronic respiratory failure with hypoxia: Secondary | ICD-10-CM | POA: Diagnosis not present

## 2018-03-28 NOTE — Telephone Encounter (Signed)
Received call from Gonzella Lex NP, calling from hospice she states she was told by [facility] they now have POC's, she is requesting the POC order be refaxed to [facility] for this patient and for [name] to contact her once this has been taken care of.   Contact number: [phone]

## 2018-03-28 NOTE — Telephone Encounter (Signed)
LMTCB x1 for [facility] with Hospice. There was an order placed on 03/25/2018 that was received by [facility].

## 2018-03-29 DIAGNOSIS — F3164 Bipolar disorder, current episode mixed, severe, with psychotic features: Secondary | ICD-10-CM | POA: Diagnosis not present

## 2018-03-29 DIAGNOSIS — S2242XD Multiple fractures of ribs, left side, subsequent encounter for fracture with routine healing: Secondary | ICD-10-CM | POA: Diagnosis not present

## 2018-03-29 DIAGNOSIS — J9611 Chronic respiratory failure with hypoxia: Secondary | ICD-10-CM | POA: Diagnosis not present

## 2018-03-29 DIAGNOSIS — G931 Anoxic brain damage, not elsewhere classified: Secondary | ICD-10-CM | POA: Diagnosis not present

## 2018-03-29 DIAGNOSIS — I5032 Chronic diastolic (congestive) heart failure: Secondary | ICD-10-CM | POA: Diagnosis not present

## 2018-03-29 DIAGNOSIS — I130 Hypertensive heart and chronic kidney disease with heart failure and stage 1 through stage 4 chronic kidney disease, or unspecified chronic kidney disease: Secondary | ICD-10-CM | POA: Diagnosis not present

## 2018-03-29 DIAGNOSIS — R4781 Slurred speech: Secondary | ICD-10-CM | POA: Diagnosis not present

## 2018-03-29 DIAGNOSIS — N183 Chronic kidney disease, stage 3 (moderate): Secondary | ICD-10-CM | POA: Diagnosis not present

## 2018-03-29 DIAGNOSIS — E1122 Type 2 diabetes mellitus with diabetic chronic kidney disease: Secondary | ICD-10-CM | POA: Diagnosis not present

## 2018-03-29 DIAGNOSIS — J449 Chronic obstructive pulmonary disease, unspecified: Secondary | ICD-10-CM | POA: Diagnosis not present

## 2018-03-30 DIAGNOSIS — J9611 Chronic respiratory failure with hypoxia: Secondary | ICD-10-CM | POA: Diagnosis not present

## 2018-04-01 DIAGNOSIS — E119 Type 2 diabetes mellitus without complications: Secondary | ICD-10-CM | POA: Diagnosis not present

## 2018-04-01 DIAGNOSIS — H40013 Open angle with borderline findings, low risk, bilateral: Secondary | ICD-10-CM | POA: Diagnosis not present

## 2018-04-01 DIAGNOSIS — H52223 Regular astigmatism, bilateral: Secondary | ICD-10-CM | POA: Diagnosis not present

## 2018-04-01 DIAGNOSIS — H524 Presbyopia: Secondary | ICD-10-CM | POA: Diagnosis not present

## 2018-04-01 DIAGNOSIS — H5203 Hypermetropia, bilateral: Secondary | ICD-10-CM | POA: Diagnosis not present

## 2018-04-01 DIAGNOSIS — H2513 Age-related nuclear cataract, bilateral: Secondary | ICD-10-CM | POA: Diagnosis not present

## 2018-04-01 DIAGNOSIS — H40033 Anatomical narrow angle, bilateral: Secondary | ICD-10-CM | POA: Diagnosis not present

## 2018-04-01 DIAGNOSIS — J449 Chronic obstructive pulmonary disease, unspecified: Secondary | ICD-10-CM | POA: Diagnosis not present

## 2018-04-02 ENCOUNTER — Ambulatory Visit: Payer: Self-pay | Admitting: Family Medicine

## 2018-04-02 DIAGNOSIS — M150 Primary generalized (osteo)arthritis: Secondary | ICD-10-CM | POA: Diagnosis not present

## 2018-04-02 DIAGNOSIS — F203 Undifferentiated schizophrenia: Secondary | ICD-10-CM | POA: Diagnosis not present

## 2018-04-02 DIAGNOSIS — D508 Other iron deficiency anemias: Secondary | ICD-10-CM | POA: Diagnosis not present

## 2018-04-02 DIAGNOSIS — M109 Gout, unspecified: Secondary | ICD-10-CM | POA: Diagnosis not present

## 2018-04-02 DIAGNOSIS — Z6827 Body mass index (BMI) 27.0-27.9, adult: Secondary | ICD-10-CM | POA: Diagnosis not present

## 2018-04-02 DIAGNOSIS — N184 Chronic kidney disease, stage 4 (severe): Secondary | ICD-10-CM | POA: Diagnosis not present

## 2018-04-02 DIAGNOSIS — E663 Overweight: Secondary | ICD-10-CM | POA: Diagnosis not present

## 2018-04-02 DIAGNOSIS — M2550 Pain in unspecified joint: Secondary | ICD-10-CM | POA: Diagnosis not present

## 2018-04-03 ENCOUNTER — Telehealth: Payer: Self-pay | Admitting: Family Medicine

## 2018-04-03 NOTE — Telephone Encounter (Signed)
Called Hunter and informed her that Ms Nold has dismissed me as her physician and that I have not seen her in 2 months.  In the interim, she has been seen by Dr. Abelino Derrick.

## 2018-04-03 NOTE — Telephone Encounter (Signed)
Hunter from [facility] is calling and would like to know if Dr. Andria Frames can write a letter to be faxed to their office. The letter needs to state the pt's most recent blood sugar levels and if they are considered stable. Pt will not be able to be approved for a new eye glass prescription with out letter or if the levels are not stable.  The best call back number with any questions for Yong Channel is [phone].  The best fax [phone].

## 2018-04-04 ENCOUNTER — Telehealth: Payer: Self-pay | Admitting: Internal Medicine

## 2018-04-04 ENCOUNTER — Ambulatory Visit (INDEPENDENT_AMBULATORY_CARE_PROVIDER_SITE_OTHER): Payer: Medicare HMO | Admitting: Orthopaedic Surgery

## 2018-04-04 ENCOUNTER — Other Ambulatory Visit: Payer: Self-pay

## 2018-04-04 ENCOUNTER — Ambulatory Visit: Payer: Self-pay | Admitting: Family Medicine

## 2018-04-04 ENCOUNTER — Ambulatory Visit
Admission: EM | Admit: 2018-04-04 | Discharge: 2018-04-04 | Disposition: A | Discharge status: Home / Self Care | Payer: Medicare HMO

## 2018-04-04 DIAGNOSIS — Z711 Person with feared health complaint in whom no diagnosis is made: Secondary | ICD-10-CM

## 2018-04-04 NOTE — Discharge Instructions (Signed)
Please follow up with your PCP as scheduled or with pulmonologist for blood work.

## 2018-04-04 NOTE — Telephone Encounter (Signed)
Returned call to patient per voicemail request.  She has asked this NP to call Hunter at [facility] to inform her of her last HA1C reading/ Glucose levels so that she may renew her Rx glasses. I did phone Hunter at [phone] at informed her of the most recent HA1C reading from 12/2016 of 6.0 and glucose readings have been less than 90.  Metformin was discontinued many months ago by PCP at that time.  Yong Channel will call current PCP with attempts to obtain a written note reflecting above.   I phoned patient back after speaking to [facility] and Ms. Sroka thanked me for making that call for her.  Gonzella Lex, NP-C

## 2018-04-04 NOTE — ED Triage Notes (Signed)
Pt states needs an A1C lab test for her eye doctor

## 2018-04-04 NOTE — ED Provider Notes (Signed)
58 year old female comes in requesting Hba1c done. States she had primary care appointment this morning, but was 2 mins late and they would not see her. States her eye doctor requested an A1c to be able to get her eye glasses.   Patient states she used to be a diabetic, but lost weight and no longer needs medication. Past lab work reviewed with normal CBGs. Discussed with patient we do not manage chronic diseases and do not draw Hba1cs.   Discussed with PCP office. Policy for no show is 15 mins late. Patient was more than 15 mins late and therefore was told could not be seen. Patient has new appointment on 4/6, to follow up then for testing needed.   Ok Edwards, PA-C 04/04/18 1155

## 2018-04-05 ENCOUNTER — Ambulatory Visit (INDEPENDENT_AMBULATORY_CARE_PROVIDER_SITE_OTHER): Payer: Medicare HMO | Admitting: Psychiatry

## 2018-04-05 ENCOUNTER — Other Ambulatory Visit: Payer: Self-pay

## 2018-04-05 ENCOUNTER — Ambulatory Visit (INDEPENDENT_AMBULATORY_CARE_PROVIDER_SITE_OTHER): Payer: Medicare HMO | Admitting: Orthopaedic Surgery

## 2018-04-05 VITALS — BP 128/70 | HR 68 | Temp 98.2°F | Ht 64.0 in | Wt 153.0 lb

## 2018-04-05 DIAGNOSIS — G931 Anoxic brain damage, not elsewhere classified: Secondary | ICD-10-CM | POA: Diagnosis not present

## 2018-04-05 DIAGNOSIS — S2242XD Multiple fractures of ribs, left side, subsequent encounter for fracture with routine healing: Secondary | ICD-10-CM | POA: Diagnosis not present

## 2018-04-05 DIAGNOSIS — Z79899 Other long term (current) drug therapy: Secondary | ICD-10-CM

## 2018-04-05 DIAGNOSIS — F250 Schizoaffective disorder, bipolar type: Secondary | ICD-10-CM

## 2018-04-05 DIAGNOSIS — F1421 Cocaine dependence, in remission: Secondary | ICD-10-CM | POA: Diagnosis not present

## 2018-04-05 DIAGNOSIS — I130 Hypertensive heart and chronic kidney disease with heart failure and stage 1 through stage 4 chronic kidney disease, or unspecified chronic kidney disease: Secondary | ICD-10-CM | POA: Diagnosis not present

## 2018-04-05 DIAGNOSIS — F3164 Bipolar disorder, current episode mixed, severe, with psychotic features: Secondary | ICD-10-CM | POA: Diagnosis not present

## 2018-04-05 DIAGNOSIS — R4781 Slurred speech: Secondary | ICD-10-CM | POA: Diagnosis not present

## 2018-04-05 DIAGNOSIS — I5032 Chronic diastolic (congestive) heart failure: Secondary | ICD-10-CM | POA: Diagnosis not present

## 2018-04-05 DIAGNOSIS — E1122 Type 2 diabetes mellitus with diabetic chronic kidney disease: Secondary | ICD-10-CM | POA: Diagnosis not present

## 2018-04-05 DIAGNOSIS — F5081 Binge eating disorder: Secondary | ICD-10-CM | POA: Diagnosis not present

## 2018-04-05 DIAGNOSIS — N183 Chronic kidney disease, stage 3 (moderate): Secondary | ICD-10-CM | POA: Diagnosis not present

## 2018-04-05 DIAGNOSIS — J449 Chronic obstructive pulmonary disease, unspecified: Secondary | ICD-10-CM | POA: Diagnosis not present

## 2018-04-05 HISTORY — DX: Schizoaffective disorder, bipolar type: F25.0

## 2018-04-05 MED ORDER — TRAZODONE HCL 100 MG PO TABS: 200.0000 mg | ORAL_TABLET | Freq: Every day | ORAL | 0 refills | Status: DC

## 2018-04-05 MED ORDER — LISDEXAMFETAMINE DIMESYLATE 70 MG PO CAPS: 70.0000 mg | ORAL_CAPSULE | Freq: Every day | ORAL | 0 refills | Status: DC

## 2018-04-05 MED ORDER — SAPHRIS 2.5 MG SL SUBL: 2.5000 mg | SUBLINGUAL_TABLET | Freq: Three times a day (TID) | SUBLINGUAL | 0 refills | Status: DC
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
## 2018-04-08 ENCOUNTER — Telehealth (INDEPENDENT_AMBULATORY_CARE_PROVIDER_SITE_OTHER): Payer: Self-pay

## 2018-04-08 ENCOUNTER — Telehealth: Payer: Self-pay | Admitting: Primary Care

## 2018-04-08 LAB — HEMOGLOBIN A1C
Est. average glucose Bld gHb Est-mCnc: 88 mg/dL
Hgb A1c MFr Bld: 4.7 % — ABNORMAL LOW (ref 4.8–5.6)

## 2018-04-08 NOTE — Telephone Encounter (Signed)
Called and spoke to pt. Pt wanted to know who the order was placed to for POC.  Per our records, it appears that order was placed to [facility].  I have provided pt with [facility]'s contact number. Nothing further is needed at this time.

## 2018-04-08 NOTE — Telephone Encounter (Signed)
Called patient and asked the screening questions.  Do you have now or have you had in the past 7 days a fever and/or chills? NO  Do you have now or have you had in the past 7 days a cough? NO  Do you have now or have you had in the last 7 days nausea, vomiting or abdominal pain? NO  Have you been exposed to anyone who has tested positive for COVID-19? NO  Have you or anyone who lives with you traveled within the last month? NO

## 2018-04-09 ENCOUNTER — Other Ambulatory Visit: Payer: Self-pay

## 2018-04-09 ENCOUNTER — Telehealth: Payer: Self-pay | Admitting: Family Medicine

## 2018-04-09 ENCOUNTER — Ambulatory Visit (INDEPENDENT_AMBULATORY_CARE_PROVIDER_SITE_OTHER): Payer: Medicare HMO | Admitting: Psychiatry

## 2018-04-09 ENCOUNTER — Encounter (HOSPITAL_COMMUNITY): Payer: Self-pay | Admitting: Psychiatry

## 2018-04-09 ENCOUNTER — Ambulatory Visit (INDEPENDENT_AMBULATORY_CARE_PROVIDER_SITE_OTHER): Payer: Medicare HMO | Admitting: Orthopaedic Surgery

## 2018-04-09 DIAGNOSIS — Z79899 Other long term (current) drug therapy: Secondary | ICD-10-CM | POA: Diagnosis not present

## 2018-04-09 DIAGNOSIS — F4312 Post-traumatic stress disorder, chronic: Secondary | ICD-10-CM

## 2018-04-09 DIAGNOSIS — F250 Schizoaffective disorder, bipolar type: Secondary | ICD-10-CM

## 2018-04-09 NOTE — Telephone Encounter (Signed)
Spoke with pharmacy. Rx was canceled & sent locally.

## 2018-04-09 NOTE — Telephone Encounter (Addendum)
Copied from [place] [phone]. Topic: General - Other >> Apr 09, 2018 10:56 AM Lennox Solders wrote: Reason for CRM: [facility] left on refill voicemail they needs clarification on terconazole is it apply vaginally or just to clitoris
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
## 2018-04-10 ENCOUNTER — Other Ambulatory Visit: Payer: Self-pay

## 2018-04-10 ENCOUNTER — Telehealth: Payer: Self-pay | Admitting: Primary Care

## 2018-04-10 ENCOUNTER — Other Ambulatory Visit: Payer: Medicare HMO | Admitting: Internal Medicine

## 2018-04-10 DIAGNOSIS — J9611 Chronic respiratory failure with hypoxia: Secondary | ICD-10-CM

## 2018-04-10 DIAGNOSIS — J9612 Chronic respiratory failure with hypercapnia: Principal | ICD-10-CM

## 2018-04-10 NOTE — Telephone Encounter (Signed)
Spoke with Tiffany [name]  She states needing POC order sent to [facility]  She was unable to get her o2 from [facility] was sent now sent to [facility]

## 2018-04-11 DIAGNOSIS — R4781 Slurred speech: Secondary | ICD-10-CM | POA: Diagnosis not present

## 2018-04-11 DIAGNOSIS — I130 Hypertensive heart and chronic kidney disease with heart failure and stage 1 through stage 4 chronic kidney disease, or unspecified chronic kidney disease: Secondary | ICD-10-CM | POA: Diagnosis not present

## 2018-04-11 DIAGNOSIS — I5032 Chronic diastolic (congestive) heart failure: Secondary | ICD-10-CM | POA: Diagnosis not present

## 2018-04-11 DIAGNOSIS — G931 Anoxic brain damage, not elsewhere classified: Secondary | ICD-10-CM | POA: Diagnosis not present

## 2018-04-11 DIAGNOSIS — J449 Chronic obstructive pulmonary disease, unspecified: Secondary | ICD-10-CM | POA: Diagnosis not present

## 2018-04-11 DIAGNOSIS — F3164 Bipolar disorder, current episode mixed, severe, with psychotic features: Secondary | ICD-10-CM | POA: Diagnosis not present

## 2018-04-11 DIAGNOSIS — E1122 Type 2 diabetes mellitus with diabetic chronic kidney disease: Secondary | ICD-10-CM | POA: Diagnosis not present

## 2018-04-11 DIAGNOSIS — S2242XD Multiple fractures of ribs, left side, subsequent encounter for fracture with routine healing: Secondary | ICD-10-CM | POA: Diagnosis not present

## 2018-04-11 DIAGNOSIS — N183 Chronic kidney disease, stage 3 (moderate): Secondary | ICD-10-CM | POA: Diagnosis not present

## 2018-04-12 ENCOUNTER — Telehealth: Payer: Self-pay | Admitting: Emergency Medicine

## 2018-04-12 DIAGNOSIS — B9689 Other specified bacterial agents as the cause of diseases classified elsewhere: Secondary | ICD-10-CM

## 2018-04-12 DIAGNOSIS — N898 Other specified noninflammatory disorders of vagina: Secondary | ICD-10-CM

## 2018-04-12 DIAGNOSIS — N760 Acute vaginitis: Principal | ICD-10-CM

## 2018-04-12 NOTE — Telephone Encounter (Signed)
LMTCB x1 for pt.  I do not see where anyone from our office called her.

## 2018-04-14 DIAGNOSIS — R062 Wheezing: Secondary | ICD-10-CM | POA: Diagnosis not present

## 2018-04-14 DIAGNOSIS — M6281 Muscle weakness (generalized): Secondary | ICD-10-CM | POA: Diagnosis not present

## 2018-04-14 DIAGNOSIS — J449 Chronic obstructive pulmonary disease, unspecified: Secondary | ICD-10-CM | POA: Diagnosis not present

## 2018-04-14 DIAGNOSIS — J45909 Unspecified asthma, uncomplicated: Secondary | ICD-10-CM | POA: Diagnosis not present

## 2018-04-15 DIAGNOSIS — J45909 Unspecified asthma, uncomplicated: Secondary | ICD-10-CM | POA: Diagnosis not present

## 2018-04-15 DIAGNOSIS — J449 Chronic obstructive pulmonary disease, unspecified: Secondary | ICD-10-CM | POA: Diagnosis not present

## 2018-04-15 DIAGNOSIS — R062 Wheezing: Secondary | ICD-10-CM | POA: Diagnosis not present

## 2018-04-15 DIAGNOSIS — M6281 Muscle weakness (generalized): Secondary | ICD-10-CM | POA: Diagnosis not present

## 2018-04-15 NOTE — Telephone Encounter (Signed)
LMTCB x2 for pt 

## 2018-04-16 ENCOUNTER — Ambulatory Visit (INDEPENDENT_AMBULATORY_CARE_PROVIDER_SITE_OTHER): Payer: Medicare HMO | Admitting: Orthopaedic Surgery

## 2018-04-16 ENCOUNTER — Telehealth (INDEPENDENT_AMBULATORY_CARE_PROVIDER_SITE_OTHER): Payer: Self-pay

## 2018-04-16 NOTE — Telephone Encounter (Signed)
Called patient two times, phone went straight to ringing busy x2.

## 2018-04-16 NOTE — Telephone Encounter (Signed)
Called pt X 3 but received a busy signal. Will try to call back later.

## 2018-04-16 NOTE — Telephone Encounter (Signed)
Pt returning call Cb# [phone]//[phone]

## 2018-04-16 NOTE — Telephone Encounter (Signed)
LMOM for patient to call us back so we can ask her the screening questions and to confirm her appt for tomorrow

## 2018-04-17 ENCOUNTER — Ambulatory Visit (INDEPENDENT_AMBULATORY_CARE_PROVIDER_SITE_OTHER): Payer: Medicare HMO | Admitting: Orthopaedic Surgery

## 2018-04-17 ENCOUNTER — Telehealth (INDEPENDENT_AMBULATORY_CARE_PROVIDER_SITE_OTHER): Payer: Self-pay

## 2018-04-17 ENCOUNTER — Telehealth: Payer: Self-pay | Admitting: Obstetrics and Gynecology

## 2018-04-17 NOTE — Telephone Encounter (Signed)
Patient is returning phone call.  Patient phone number is [phone].

## 2018-04-17 NOTE — Telephone Encounter (Signed)
ATC pt, line rang busy x2. Will try back. 

## 2018-04-17 NOTE — Telephone Encounter (Signed)
Called patient and asked the screening questions.  Do you have now or have you had in the past 7 days a fever and/or chills? NO  Do you have now or have you had in the past 7 days a cough? NO  Do you have now or have you had in the last 7 days nausea, vomiting or abdominal pain? NO  Have you been exposed to anyone who has tested positive for COVID-19? NO  Have you or anyone who lives with you traveled within the last month? NO

## 2018-04-17 NOTE — Telephone Encounter (Signed)
Called patient, unable to reach phone rang busy. Will call back.

## 2018-04-17 NOTE — Telephone Encounter (Signed)
The patient called in to request an appoinment for vagina irration. Informed the appointments are being scheduled in the end of May. She stated she would accept a call however she mainly would like her medication refilled.

## 2018-04-18 ENCOUNTER — Ambulatory Visit (INDEPENDENT_AMBULATORY_CARE_PROVIDER_SITE_OTHER): Payer: Medicare HMO | Admitting: Orthopaedic Surgery

## 2018-04-18 ENCOUNTER — Telehealth (HOSPITAL_COMMUNITY): Payer: Self-pay | Admitting: Psychiatry

## 2018-04-18 ENCOUNTER — Telehealth: Payer: Self-pay | Admitting: Emergency Medicine

## 2018-04-18 DIAGNOSIS — J9612 Chronic respiratory failure with hypercapnia: Secondary | ICD-10-CM

## 2018-04-18 DIAGNOSIS — J9611 Chronic respiratory failure with hypoxia: Secondary | ICD-10-CM

## 2018-04-18 MED ORDER — TERCONAZOLE 0.8 % VA CREA: TOPICAL_CREAM | VAGINAL | 0 refills | Status: DC

## 2018-04-18 MED ORDER — METRONIDAZOLE 0.75 % VA GEL: 1.0000 | Freq: Every day | VAGINAL | 0 refills | Status: DC

## 2018-04-18 NOTE — Telephone Encounter (Signed)
I called Tiffany [name] back and she reports she is having the same vaginal discharge she was having when dx with BV but not as much. She also reports still having itching at clitoris and would like refill of flagyl and terconazole. Due to covid 19 will not have her do self swab in office, will proceed with refill per protocol.

## 2018-04-18 NOTE — Telephone Encounter (Signed)
04/18/18  2:55pm Spoke with [name] after speaking with Shawn (RN) because patient stated that she wanted a 90day supply of all her medication so that way it can be free.  The patient just started her Vyvanse 70mg  and the provider want to wait before sending 90day supply this was explained to the patient.

## 2018-04-18 NOTE — Telephone Encounter (Signed)
ATC pt, line rang busy. Will try back.

## 2018-04-18 NOTE — Telephone Encounter (Signed)
ATC patient phone rang busy

## 2018-04-19 DIAGNOSIS — F3164 Bipolar disorder, current episode mixed, severe, with psychotic features: Secondary | ICD-10-CM | POA: Diagnosis not present

## 2018-04-19 DIAGNOSIS — R4781 Slurred speech: Secondary | ICD-10-CM | POA: Diagnosis not present

## 2018-04-19 DIAGNOSIS — I5032 Chronic diastolic (congestive) heart failure: Secondary | ICD-10-CM | POA: Diagnosis not present

## 2018-04-19 DIAGNOSIS — J449 Chronic obstructive pulmonary disease, unspecified: Secondary | ICD-10-CM | POA: Diagnosis not present

## 2018-04-19 DIAGNOSIS — E1122 Type 2 diabetes mellitus with diabetic chronic kidney disease: Secondary | ICD-10-CM | POA: Diagnosis not present

## 2018-04-19 DIAGNOSIS — S2242XD Multiple fractures of ribs, left side, subsequent encounter for fracture with routine healing: Secondary | ICD-10-CM | POA: Diagnosis not present

## 2018-04-19 DIAGNOSIS — G931 Anoxic brain damage, not elsewhere classified: Secondary | ICD-10-CM | POA: Diagnosis not present

## 2018-04-19 DIAGNOSIS — N183 Chronic kidney disease, stage 3 (moderate): Secondary | ICD-10-CM | POA: Diagnosis not present

## 2018-04-19 DIAGNOSIS — I130 Hypertensive heart and chronic kidney disease with heart failure and stage 1 through stage 4 chronic kidney disease, or unspecified chronic kidney disease: Secondary | ICD-10-CM | POA: Diagnosis not present

## 2018-04-19 NOTE — Telephone Encounter (Signed)
ATC patient phone rang busy

## 2018-04-22 ENCOUNTER — Ambulatory Visit: Admission: EM | Admit: 2018-04-22 | Discharge: 2018-04-22 | Discharge status: Absent without leave | Payer: Self-pay

## 2018-04-22 ENCOUNTER — Ambulatory Visit
Admission: EM | Admit: 2018-04-22 | Discharge: 2018-04-22 | Disposition: A | Discharge status: Home / Self Care | Payer: Medicare HMO | Attending: Family Medicine | Admitting: Family Medicine

## 2018-04-22 ENCOUNTER — Ambulatory Visit: Payer: Self-pay | Admitting: Family Medicine

## 2018-04-22 DIAGNOSIS — I5032 Chronic diastolic (congestive) heart failure: Secondary | ICD-10-CM | POA: Diagnosis not present

## 2018-04-22 DIAGNOSIS — S2242XD Multiple fractures of ribs, left side, subsequent encounter for fracture with routine healing: Secondary | ICD-10-CM | POA: Diagnosis not present

## 2018-04-22 DIAGNOSIS — I10 Essential (primary) hypertension: Secondary | ICD-10-CM | POA: Diagnosis not present

## 2018-04-22 DIAGNOSIS — G931 Anoxic brain damage, not elsewhere classified: Secondary | ICD-10-CM | POA: Diagnosis not present

## 2018-04-22 DIAGNOSIS — F3164 Bipolar disorder, current episode mixed, severe, with psychotic features: Secondary | ICD-10-CM | POA: Diagnosis not present

## 2018-04-22 DIAGNOSIS — J45909 Unspecified asthma, uncomplicated: Secondary | ICD-10-CM | POA: Diagnosis not present

## 2018-04-22 DIAGNOSIS — N183 Chronic kidney disease, stage 3 (moderate): Secondary | ICD-10-CM | POA: Diagnosis not present

## 2018-04-22 DIAGNOSIS — I130 Hypertensive heart and chronic kidney disease with heart failure and stage 1 through stage 4 chronic kidney disease, or unspecified chronic kidney disease: Secondary | ICD-10-CM | POA: Diagnosis not present

## 2018-04-22 DIAGNOSIS — R4781 Slurred speech: Secondary | ICD-10-CM | POA: Diagnosis not present

## 2018-04-22 DIAGNOSIS — M6281 Muscle weakness (generalized): Secondary | ICD-10-CM | POA: Diagnosis not present

## 2018-04-22 DIAGNOSIS — R062 Wheezing: Secondary | ICD-10-CM | POA: Diagnosis not present

## 2018-04-22 DIAGNOSIS — J449 Chronic obstructive pulmonary disease, unspecified: Secondary | ICD-10-CM | POA: Diagnosis not present

## 2018-04-22 DIAGNOSIS — E1122 Type 2 diabetes mellitus with diabetic chronic kidney disease: Secondary | ICD-10-CM | POA: Diagnosis not present

## 2018-04-22 MED ORDER — METOLAZONE 2.5 MG PO TABS: 2.5000 mg | ORAL_TABLET | Freq: Every day | ORAL | 0 refills | Status: DC
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
## 2018-04-22 NOTE — ED Triage Notes (Signed)
Pt c/o hypertension and swelling to both ankles x36month; no distress noted

## 2018-04-22 NOTE — Telephone Encounter (Signed)
Called and spoke with patient today regarding POC Pt would like to see if [facility] would help her in getting a POC the tanks are too heavy to carry Advised pt that RB is out of the office due to being at the hospital regards to covid19 Pt scheduled appt with RB for August 12 2018 at 1130am for f/u ov Pt advised that she will call [employer] tomorrow morning at 9am at [phone] for additional info  LVM on general VM for [employer] at [phone] to return call. X1

## 2018-04-22 NOTE — Discharge Instructions (Signed)
Eat fresh fruit daily

## 2018-04-22 NOTE — Telephone Encounter (Signed)
ATC patient phone rang busy

## 2018-04-23 ENCOUNTER — Telehealth: Payer: Self-pay | Admitting: Emergency Medicine

## 2018-04-23 ENCOUNTER — Ambulatory Visit (HOSPITAL_COMMUNITY): Payer: Medicare HMO | Admitting: Psychiatry

## 2018-04-23 ENCOUNTER — Telehealth (INDEPENDENT_AMBULATORY_CARE_PROVIDER_SITE_OTHER): Payer: Self-pay

## 2018-04-23 NOTE — Telephone Encounter (Signed)
Spoke with pt, she states the order should go to Tiffany [name] but I think [place] sent Rodena Piety a message on this already.

## 2018-04-23 NOTE — Telephone Encounter (Signed)
Pt wants POC and [facility] does not carry POC. She wanted to switch DME to [place]. Order placed on 04/10/18 should have went to [place].

## 2018-04-23 NOTE — Telephone Encounter (Signed)
Should there be another order placed changing DME companies she is on [employer] also with [facility]

## 2018-04-23 NOTE — Telephone Encounter (Signed)
Will close encounter due to 2 messages on the same topic. See previous message

## 2018-04-23 NOTE — Telephone Encounter (Signed)
Pt called back this morning to let us know that she called [facility] this morning and they are stating that we have not sent the paperwork concerning her qualification for the smaller oxygen tanks. [facility] states that we have not sent an order over. Pt CB# [phone]

## 2018-04-23 NOTE — Telephone Encounter (Signed)
Called patient and asked the screening questions.  Do you have now or have you had in the past 7 days a fever and/or chills? NO  Do you have now or have you had in the past 7 days a cough? NO  Do you have now or have you had in the last 7 days nausea, vomiting or abdominal pain? NO  Have you been exposed to anyone who has tested positive for COVID-19? NO  Have you or anyone who lives with you traveled within the last month? NO

## 2018-04-23 NOTE — Telephone Encounter (Signed)
ATC line busy  Will try back later. It appears that orders were sent to [facility].

## 2018-04-24 ENCOUNTER — Telehealth: Payer: Self-pay | Admitting: Emergency Medicine

## 2018-04-24 ENCOUNTER — Ambulatory Visit (INDEPENDENT_AMBULATORY_CARE_PROVIDER_SITE_OTHER): Payer: Medicare HMO | Admitting: Orthopaedic Surgery

## 2018-04-24 DIAGNOSIS — J449 Chronic obstructive pulmonary disease, unspecified: Secondary | ICD-10-CM | POA: Diagnosis not present

## 2018-04-24 DIAGNOSIS — I5032 Chronic diastolic (congestive) heart failure: Secondary | ICD-10-CM

## 2018-04-24 NOTE — Telephone Encounter (Signed)
Another order placed to change DME from [facility] to [facility] for her Trilogy.

## 2018-04-24 NOTE — Telephone Encounter (Signed)
Attempted to call pt but line went straight to VM. Left message for pt to return call. 

## 2018-04-24 NOTE — Telephone Encounter (Signed)
Another order has been placed to change DME from [facility] to [facility] for [place].

## 2018-04-24 NOTE — Telephone Encounter (Signed)
Called [facility] [phone]) Tiffany [name] stated the only change that needed to be made was to the "Frequency". Needs to be changed from POC to concentrator and POC.  Advised the order will be corrected and resubmitted.,

## 2018-04-25 ENCOUNTER — Ambulatory Visit: Payer: Self-pay | Admitting: Family Medicine

## 2018-04-25 DIAGNOSIS — G4733 Obstructive sleep apnea (adult) (pediatric): Secondary | ICD-10-CM | POA: Diagnosis not present

## 2018-04-25 DIAGNOSIS — R062 Wheezing: Secondary | ICD-10-CM | POA: Diagnosis not present

## 2018-04-25 DIAGNOSIS — J45909 Unspecified asthma, uncomplicated: Secondary | ICD-10-CM | POA: Diagnosis not present

## 2018-04-25 NOTE — Telephone Encounter (Signed)
Pt is returning call & can be reached at [phone].

## 2018-04-25 NOTE — Telephone Encounter (Signed)
Attempted to call pt but unable to reach. Left message for pt to return call. 

## 2018-04-29 NOTE — Telephone Encounter (Signed)
Called patient and she has received 02 from [place]. She inquired about Trilogy, I made her aware that an order was placed for both. If she does not hear back from them then call office back. Nothing further needed.

## 2018-04-30 ENCOUNTER — Ambulatory Visit (INDEPENDENT_AMBULATORY_CARE_PROVIDER_SITE_OTHER): Payer: Medicare HMO | Admitting: Psychiatry

## 2018-04-30 ENCOUNTER — Ambulatory Visit (INDEPENDENT_AMBULATORY_CARE_PROVIDER_SITE_OTHER): Payer: Medicare HMO | Admitting: Orthopaedic Surgery

## 2018-04-30 ENCOUNTER — Ambulatory Visit (HOSPITAL_COMMUNITY): Payer: Medicare HMO | Admitting: Psychiatry

## 2018-04-30 ENCOUNTER — Other Ambulatory Visit: Payer: Self-pay

## 2018-04-30 DIAGNOSIS — G4700 Insomnia, unspecified: Secondary | ICD-10-CM

## 2018-04-30 DIAGNOSIS — F250 Schizoaffective disorder, bipolar type: Secondary | ICD-10-CM | POA: Diagnosis not present

## 2018-04-30 DIAGNOSIS — J449 Chronic obstructive pulmonary disease, unspecified: Secondary | ICD-10-CM | POA: Diagnosis not present

## 2018-04-30 DIAGNOSIS — J9612 Chronic respiratory failure with hypercapnia: Secondary | ICD-10-CM | POA: Diagnosis not present

## 2018-04-30 DIAGNOSIS — F5081 Binge eating disorder: Secondary | ICD-10-CM | POA: Diagnosis not present

## 2018-04-30 DIAGNOSIS — J9611 Chronic respiratory failure with hypoxia: Secondary | ICD-10-CM | POA: Diagnosis not present

## 2018-04-30 MED ORDER — CARIPRAZINE HCL 3 MG PO CAPS: 3.0000 mg | ORAL_CAPSULE | Freq: Every day | ORAL | 0 refills | Status: DC

## 2018-04-30 MED ORDER — LISDEXAMFETAMINE DIMESYLATE 70 MG PO CAPS: 70.0000 mg | ORAL_CAPSULE | Freq: Every day | ORAL | 0 refills | Status: DC

## 2018-04-30 MED ORDER — TRAZODONE HCL 100 MG PO TABS: 200.0000 mg | ORAL_TABLET | Freq: Every day | ORAL | 0 refills | Status: DC

## 2018-04-30 MED ORDER — SAPHRIS 2.5 MG SL SUBL: 2.5000 mg | SUBLINGUAL_TABLET | Freq: Two times a day (BID) | SUBLINGUAL | 0 refills | Status: DC

## 2018-04-30 MED ORDER — ESCITALOPRAM OXALATE 20 MG PO TABS: 20.0000 mg | ORAL_TABLET | Freq: Every day | ORAL | 0 refills | Status: DC

## 2018-04-30 NOTE — Progress Notes (Addendum)
[place] MD/PA/NP OP Progress Note  04/30/2018 2:36 PM PEACE NOYES  MRN:  [id number] Interview was conducted by phone and I verified that I was speaking with the correct person using two identifiers. I discussed the limitations of evaluation and management by telemedicine and  the availability of in person appointments. Patient expressed understanding and agreed to proceed.  Chief Complaint: "I feel paranoid and my medications are out".  HPI: 58 yo divorced AAF with schizoaffective disorder bipolar type (vs paranoid schizophrenia), remote hx of cocaine addiction (clean for 3 years) and newly diagnosed bing eating disorder. [name] a former patient of Dr. [name] whom she saw last time in February 2019. She then started to see a new psychiatrist Dr. Frederico Hamman at [facility]. He changed her Geodon (she had QTC prolongation on it) to Saphris, mirtazapine was discontinued whereas high dose  (40 mg) escitalopram continued and added Vyvanse for binge eating disorder. Ane used to weigh over 300 lbs and since starting Vyvanse her binge eating is well controlled and she was able to lose a lot of weight today she weighs 156 lbs). She continues to take diazepam 10 mg tid and trazodone for sleep (200 mg). She reported feeling emotionally stable and wanting to continue on current psychotropic medications without any changes during her initial visit in March. Today she reports increased paranoia and is requesting adjustments to address that. She also reports runing out of Lexapro and Vyvanse (should have not). She does not want to take diazepam she was on in the past but wants another antipsychotic in addition to low dose of Saphris she is already on. Given her obesity many are not a good choice. She was on Geodon in the past and reports not tolerating it well.  [name] diabetic.   Visit Diagnosis:    ICD-10-CM   1. Schizoaffective disorder, bipolar type ([place]) F25.0   2. Binge eating disorder F50.81     Past  Psychiatric History: Please see intake H&P.  Past Medical History:  Past Medical History:  Diagnosis Date  . Anxiety   . Arthritis    "all over" (04/10/2016)  . Asthma   . Cardiac arrest ([place]) 09/08/2016   PEA  . Carotid artery stenosis    1-39% bilateral by dopplers 11/2016  . Chronic bronchitis ([place])   . Chronic diastolic (congestive) heart failure ([place])   . Chronic kidney disease    "I see a kidney dr." (04/10/2016)  . Cocaine abuse ([place])   . Complication of anesthesia    decreased bp, decreased heart rate  . Depression   . Disorder of nervous system   . Emphysema   . GERD (gastroesophageal reflux disease)   . Heart attack ([place]) 1980s  . History of blood transfusion 1994   "couldn't stop bleeding from my period"  . Hyperlipidemia LDL goal <70   . Hypertension   . Incontinence   . Manic depression ([place])   . On home oxygen therapy    "6L; 24/7" (04/10/2016)  . OSA on CPAP    "wear mask sometimes" (04/10/2016)  . Paranoid ([place])    "sometimes; I'm on RX for it" (04/10/2016)  . Pneumonia    "I've had it several times; haven't had it since 06/2015" (04/10/2016)  . Schizophrenia ([place])   . Seasonal allergies   . Seizures ([place])    "don't know what kind; last one was ~ 1 yr ago" (04/10/2016)  . Sinus trouble   . Stroke [facility]) 1980s  denies residual on 04/10/2016  . Type II diabetes mellitus ([place])     Past Surgical History:  Procedure Laterality Date  . CESAREAN SECTION  1997  . HERNIA REPAIR    . IR CHOLANGIOGRAM EXISTING TUBE  07/20/2016  . IR PERC CHOLECYSTOSTOMY  05/10/2016  . IR RADIOLOGIST EVAL & MGMT  06/08/2016  . IR RADIOLOGIST EVAL & MGMT  06/29/2016  . IR SINUS/FIST TUBE CHK-NON GI  07/12/2016  . RIGHT/LEFT HEART CATH AND CORONARY ANGIOGRAPHY N/A 06/19/2017   Procedure: RIGHT/LEFT HEART CATH AND CORONARY ANGIOGRAPHY;  Surgeon: Jolaine Artist, MD;  Location: [facility];  Service: Cardiovascular;  Laterality: N/A;  . TIBIA IM NAIL INSERTION Right 07/12/2016    Procedure: INTRAMEDULLARY (IM) NAIL RIGHT TIBIA;  Surgeon: Leandrew Koyanagi, MD;  Location: [facility];  Service: Orthopedics;  Laterality: Right;  . UMBILICAL HERNIA REPAIR  ~ 1963   "that's why I don't have a belly button"  . VAGINAL HYSTERECTOMY      Family Psychiatric History: Reviewed.  Family History:  Family History  Problem Relation Age of Onset  . Cancer Father        prostate  . Cancer Mother        lung  . Depression Mother   . Depression Sister   . Anxiety disorder Sister   . Schizophrenia Sister   . Bipolar disorder Sister   . Depression Sister   . Depression Brother   . Heart failure Other        cousin    Social History:  Social History   Socioeconomic History  . Marital status: Widowed    Spouse name: Not on file  . Number of children: 3  . Years of education: Not on file  . Highest education level: Not on file  Occupational History  . 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 Financial resource strain: Not on file  . Food insecurity:    Worry: Sometimes true    Inability: Never true  . Transportation needs:    Medical: No    Non-medical: No  Tobacco Use  . Smoking status: Former Smoker    Packs/day: 1.50    Years: 38.00    Pack years: 57.00    Types: Cigarettes    Start date: 03/13/1977    Last attempt to quit: 04/10/2016    Years since quitting: 2.0  . Smokeless tobacco: Never Used  Substance and Sexual Activity  . Alcohol use: No    Alcohol/week: 0.0 standard drinks  . Drug use: No    Types: Cocaine    Comment: 04/10/2016 "last used cocaine back in November 2017"  . Sexual activity: Not Currently    Birth control/protection: Surgical  Lifestyle  . Physical activity:    Days per week: Not on file    Minutes per session: Not on file  . Stress: Not on file  Relationships  . Social connections:    Talks on phone: Not on file    Gets together: Not on file    Attends religious service: Not on file    Active member  of club or organization: Not on file    Attends meetings of clubs or organizations: Not on file    Relationship status: Not on file  Other Topics Concern  . Not on file  Social History Narrative   Has 1 son, [name]   Lives with son and his boyfriend   Her house has ramps  and handrails should she ever needs them.    Her mother lives down the street from her and is a good support person in addition to her son.   She drives herself, has private transportation.    Cocaine free since 02/24/16, smoke free since 04/10/16    Allergies:  Allergies  Allergen Reactions  . Hydrocodone-Acetaminophen Shortness Of Breath  . Hydroxyzine Anaphylaxis and Shortness Of Breath  . Latuda [Lurasidone Hcl] Anaphylaxis  . Magnesium-Containing Compounds Anaphylaxis  . Prednisone Anaphylaxis, Swelling and Other (See Comments)    Tongue swelling  . Tramadol Anaphylaxis  . Codeine Nausea And Vomiting  . Sulfa Antibiotics Itching  . Tape Rash    Metabolic Disorder Labs: Lab Results  Component Value Date   HGBA1C 4.7 (L) 04/05/2018   MPG 125.5 01/07/2017   MPG 111.15 11/03/2016   No results found for: PROLACTIN Lab Results  Component Value Date   CHOL 129 01/07/2017   TRIG 121 11/21/2017   HDL 35 (L) 01/07/2017   CHOLHDL 3.7 01/07/2017   VLDL 38 01/07/2017   LDLCALC 56 01/07/2017   LDLCALC 44 07/10/2016   Lab Results  Component Value Date   TSH 0.563 03/15/2016   TSH 1.060 12/04/2015    Therapeutic Level Labs: No results found for: LITHIUM Lab Results  Component Value Date   VALPROATE 20 (L) 09/09/2016   VALPROATE 46 (L) 07/10/2016   No components found for:  CBMZ  Current Medications: Current Outpatient Medications  Medication Sig Dispense Refill  . albuterol (PROVENTIL) (2.5 MG/3ML) 0.083% nebulizer solution Take 3 mLs (2.5 mg total) by nebulization every 6 (six) hours as needed for wheezing or shortness of breath. 150 mL 1  . amLODipine (NORVASC) 5 MG tablet Take 5 mg by mouth  daily.    [name] aspirin (GOODSENSE ASPIRIN LOW DOSE) 81 MG EC tablet TAKE 1 TABLET (81 MG TOTAL) BY MOUTH DAILY (MORNING). (Patient taking differently: Take 81 mg by mouth daily. ) 90 tablet 3  . cariprazine (VRAYLAR) capsule Take 1 capsule (3 mg total) by mouth daily. 30 capsule 0  . colchicine (COLCRYS) 0.6 MG tablet TAKE 1 TABLET (0.6 MG TOTAL) BY MOUTH 2 (TWO) TIMES DAILY. (Patient taking differently: Take 0.6 mg by mouth 2 (two) times daily. ) 180 tablet 3  . diazepam (VALIUM) 10 MG tablet Take 10 mg by mouth 3 (three) times daily.  1  . diclofenac sodium (VOLTAREN) 1 % GEL Apply 2 g topically 4 (four) times daily. (Patient taking differently: Apply 2 g topically 4 (four) times daily as needed (pain). ) 100 g 0  . escitalopram (LEXAPRO) 20 MG tablet Take 1 tablet (20 mg total) by mouth daily for 30 days. 30 tablet 0  . febuxostat (ULORIC) 40 MG tablet Take 1 tablet by mouth daily.    . ferrous sulfate (FEROSUL) 325 (65 FE) MG tablet TAKE 1 TABLET BY MOUTH DAILY.(MORNING) (Patient taking differently: Take 325 mg by mouth daily with breakfast. ) 90 tablet 3  . flunisolide (NASALIDE) 25 MCG/ACT (0.025%) SOLN Place 2 sprays into the nose daily. 1 Bottle 2  . Fluticasone-Umeclidin-Vilant (TRELEGY ELLIPTA) 100-62.5-25 MCG/INH AEPB Inhale 1 puff into the lungs daily. 60 each 3  . gabapentin (NEURONTIN) 600 MG tablet Take 1 tablet (600 mg total) by mouth 2 (two) times daily. (Patient taking differently: Take 1,200 mg by mouth 3 (three) times daily. ) 360 tablet 0  . lidocaine (LIDODERM) 5 % Place 1 patch onto the skin daily. Remove & Discard patch  within 12 hours or as directed by MD 30 patch 0  . lisdexamfetamine (VYVANSE) 70 MG capsule Take 1 capsule (70 mg total) by mouth daily for 30 days. 30 capsule 0  . metolazone (ZAROXOLYN) 2.5 MG tablet Take 1 tablet (2.5 mg total) by mouth daily. 90 tablet 0  . metroNIDAZOLE (METROGEL) 0.75 % vaginal gel Place 1 Applicatorful vaginally at bedtime. Apply one  applicatorful to vagina at bedtime for 5 days 70 g 0  . miconazole (MICONAZOLE 7) 2 % vaginal cream Place 1 Applicatorful vaginally at bedtime. 45 g 0  . montelukast (SINGULAIR) 10 MG tablet Take 1 tablet (10 mg total) by mouth at bedtime. 90 tablet 3  . Multiple Vitamin (MULTIVITAMIN WITH MINERALS) TABS tablet Take 1 tablet by mouth daily.    [name] omeprazole (PRILOSEC) 40 MG capsule Take 1 capsule (40 mg total) by mouth 2 (two) times daily. 180 capsule 3  . oxybutynin (DITROPAN-XL) 10 MG 24 hr tablet TAKE 1 TABLET BY MOUTH EVERYDAY AT BEDTIME (Patient taking differently: Take 10 mg by mouth at bedtime. ) 90 tablet 3  . potassium chloride (KLOR-CON M10) 10 MEQ tablet Take 4 tablets (40 mEq total) by mouth daily. (Patient taking differently: Take 30 mEq by mouth 2 (two) times daily. ) 90 tablet 0  . SAPHRIS 2.5 MG SUBL Place 1 tablet (2.5 mg total) under the tongue 2 (two) times daily. 180 tablet 0  . terconazole (TERAZOL 3) 0.8 % vaginal cream Apply pea sized amount to clitoris nightly. 20 g 0  . torsemide (DEMADEX) 20 MG tablet Take 4 tablets (80 mg total) by mouth 2 (two) times daily. 240 tablet 4  . traZODone (DESYREL) 100 MG tablet Take 2 tablets (200 mg total) by mouth at bedtime. 180 tablet 0  . zinc oxide 20 % ointment Apply 1 application topically as needed for irritation. 56.7 g 5   No current facility-administered medications for this visit.     Psychiatric Specialty Exam: Review of Systems  Respiratory: Positive for shortness of breath.   Neurological: Positive for tingling.  Psychiatric/Behavioral: Positive for depression. The patient is nervous/anxious and has insomnia.     There were no vitals taken for this visit.There is no height or weight on file to calculate BMI.  General Appearance: NA  Eye Contact:  NA  Speech:  Rambling  Volume:  Normal  Mood:  Anxious  Affect:  NA  Thought Process:  Descriptions of Associations: Circumstantial  Orientation:  Full (Time, Place, and  Person)  Thought Content: Paranoid Ideation   Suicidal Thoughts:  No  Homicidal Thoughts:  No  Memory:  Immediate;   Fair Recent;   Good Remote;   Good  Judgement:  Fair  Insight:  Shallow  Psychomotor Activity:  NA  Concentration:  Concentration: Fair  Recall:  [employer] of Knowledge: Fair  Language: Fair  Akathisia:  NA  Handed:  Right  AIMS (if indicated): not done  Assets:  Desire for Improvement Housing Social Support  ADL's:  Intact  Cognition: WNL  Sleep:  Good   Screenings: AUDIT     Admission (Discharged) from 12/01/2015 in [facility]  Alcohol Use Disorder Identification Test Final Score (AUDIT)  0    GAD-7     Office Visit from 03/21/2018 in [place] for [facility]  Total GAD-7 Score  4    PHQ2-9     Office Visit from 03/21/2018 in [place] for [facility] Office Visit from 02/07/2018  in [facility] Office Visit from 12/31/2017 in [facility] Office Visit from 11/27/2017 in [facility] Office Visit from 11/12/2017 in [facility]  PHQ-2 Total Score  2  0  0  0  0  PHQ-9 Total Score  2  -  -  -  -       Assessment and Plan: 58 yo divorced AAF with schizoaffective disorder bipolar type (vs paranoid schizophrenia), remote hx of cocaine addiction (clean for 3 years) and newly diagnosed bing eating disorder. [name] a former patient of Dr. [name] whom she saw last time in February 2019. She then started to see a new psychiatrist Dr. Frederico Hamman at [facility]. He changed her Geodon (she had QTC prolongation on it) to Saphris, mirtazapine was discontinued whereas high dose  (40 mg) escitalopram continued and added Vyvanse for binge eating disorder. Cataleia used to weigh over 300 lbs and since starting Vyvanse her binge eating is well controlled and she was able to lose a lot of weight today she weighs 156 lbs). She continues to take  diazepam 10 mg tid and trazodone for sleep (200 mg). She reported feeling emotionally stable and wanting to continue on current psychotropic medications without any changes during her initial visit in March. Today she reports increased paranoia and is requesting medication adjustments to address that. She also reports runing out of Lexapro and Vyvanse (should have not). She does not want to take diazepam but wants another antipsychotic in addition to low dose of Saphris she is already on. Given her obesity many are not a good choice. She was on Geodon in the past and reports not tolerating it well.  [name] also diabetic.   Plan: Add Vrylar 3 mg daily and continue Saphris 2.5 mg bid (was on tid). No change in other meds: continue trazodone, Lexapro, Vyvanse. Return to clinic in 4 weeks.  The plan was discussed with patient who had an opportunity to ask questions and these were all answered. I spend 25 minutes in phone conference with the patient.   Stephanie Acre, MD 04/30/2018, 2:36 PM

## 2018-05-01 ENCOUNTER — Other Ambulatory Visit: Payer: Self-pay

## 2018-05-01 ENCOUNTER — Ambulatory Visit (INDEPENDENT_AMBULATORY_CARE_PROVIDER_SITE_OTHER): Payer: Medicare HMO | Admitting: Psychiatry

## 2018-05-01 ENCOUNTER — Encounter (HOSPITAL_COMMUNITY): Payer: Self-pay | Admitting: Psychiatry

## 2018-05-01 ENCOUNTER — Other Ambulatory Visit (HOSPITAL_COMMUNITY): Payer: Self-pay

## 2018-05-01 DIAGNOSIS — F250 Schizoaffective disorder, bipolar type: Secondary | ICD-10-CM | POA: Diagnosis not present

## 2018-05-01 DIAGNOSIS — F4312 Post-traumatic stress disorder, chronic: Secondary | ICD-10-CM | POA: Diagnosis not present

## 2018-05-01 MED ORDER — SAPHRIS 2.5 MG SL SUBL: 2.5000 mg | SUBLINGUAL_TABLET | Freq: Two times a day (BID) | SUBLINGUAL | 2 refills | Status: DC
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
## 2018-05-02 DIAGNOSIS — F3164 Bipolar disorder, current episode mixed, severe, with psychotic features: Secondary | ICD-10-CM | POA: Diagnosis not present

## 2018-05-02 DIAGNOSIS — I130 Hypertensive heart and chronic kidney disease with heart failure and stage 1 through stage 4 chronic kidney disease, or unspecified chronic kidney disease: Secondary | ICD-10-CM | POA: Diagnosis not present

## 2018-05-02 DIAGNOSIS — G931 Anoxic brain damage, not elsewhere classified: Secondary | ICD-10-CM | POA: Diagnosis not present

## 2018-05-02 DIAGNOSIS — E1122 Type 2 diabetes mellitus with diabetic chronic kidney disease: Secondary | ICD-10-CM | POA: Diagnosis not present

## 2018-05-02 DIAGNOSIS — N183 Chronic kidney disease, stage 3 (moderate): Secondary | ICD-10-CM | POA: Diagnosis not present

## 2018-05-02 DIAGNOSIS — I5032 Chronic diastolic (congestive) heart failure: Secondary | ICD-10-CM | POA: Diagnosis not present

## 2018-05-02 DIAGNOSIS — J449 Chronic obstructive pulmonary disease, unspecified: Secondary | ICD-10-CM | POA: Diagnosis not present

## 2018-05-02 DIAGNOSIS — R4781 Slurred speech: Secondary | ICD-10-CM | POA: Diagnosis not present

## 2018-05-02 DIAGNOSIS — S2242XD Multiple fractures of ribs, left side, subsequent encounter for fracture with routine healing: Secondary | ICD-10-CM | POA: Diagnosis not present

## 2018-05-03 ENCOUNTER — Ambulatory Visit (HOSPITAL_COMMUNITY): Payer: Medicare HMO | Admitting: Psychiatry

## 2018-05-06 ENCOUNTER — Telehealth (HOSPITAL_COMMUNITY): Payer: Self-pay

## 2018-05-06 ENCOUNTER — Other Ambulatory Visit (HOSPITAL_COMMUNITY): Payer: Self-pay | Admitting: Psychiatry

## 2018-05-06 DIAGNOSIS — S2242XD Multiple fractures of ribs, left side, subsequent encounter for fracture with routine healing: Secondary | ICD-10-CM | POA: Diagnosis not present

## 2018-05-06 DIAGNOSIS — R4781 Slurred speech: Secondary | ICD-10-CM | POA: Diagnosis not present

## 2018-05-06 DIAGNOSIS — I130 Hypertensive heart and chronic kidney disease with heart failure and stage 1 through stage 4 chronic kidney disease, or unspecified chronic kidney disease: Secondary | ICD-10-CM | POA: Diagnosis not present

## 2018-05-06 DIAGNOSIS — I5032 Chronic diastolic (congestive) heart failure: Secondary | ICD-10-CM | POA: Diagnosis not present

## 2018-05-06 DIAGNOSIS — E1122 Type 2 diabetes mellitus with diabetic chronic kidney disease: Secondary | ICD-10-CM | POA: Diagnosis not present

## 2018-05-06 DIAGNOSIS — G931 Anoxic brain damage, not elsewhere classified: Secondary | ICD-10-CM | POA: Diagnosis not present

## 2018-05-06 DIAGNOSIS — J449 Chronic obstructive pulmonary disease, unspecified: Secondary | ICD-10-CM | POA: Diagnosis not present

## 2018-05-06 DIAGNOSIS — F3164 Bipolar disorder, current episode mixed, severe, with psychotic features: Secondary | ICD-10-CM | POA: Diagnosis not present

## 2018-05-06 DIAGNOSIS — N183 Chronic kidney disease, stage 3 (moderate): Secondary | ICD-10-CM | POA: Diagnosis not present

## 2018-05-06 NOTE — Telephone Encounter (Signed)
She could go back to 2.5 mg tid - we added Vraylar so Saphris was decreased to bid but it seems it was not a good idea. The other option would be to go up to 5 mg bid but she resisited that suggestion in the past (due to some concerns about her heart/sedation). She will need tolet [name] know what she did so we can adjust her Rx accordingly otherwise she will run out early.  OP

## 2018-05-06 NOTE — Telephone Encounter (Signed)
Patient is calling to see if you can increase the Saphris. Please review and advise, thank you

## 2018-05-06 NOTE — Telephone Encounter (Signed)
In that case she soul continue Saphris at 2.5 mg bid and double dose of Vraylar to 6 mg daily. It may case EPS at this dose so she should let us know if her paranoia is subsiding after dose is increased and how she is tolerating that dose.  OP

## 2018-05-06 NOTE — Telephone Encounter (Signed)
I spoke to patient and the medication she was referring to was the Vraylar, she would like to increase the Vraylar. My apologies she did not know the name when I sent the original message. Please review and advise, thank you

## 2018-05-07 ENCOUNTER — Ambulatory Visit (INDEPENDENT_AMBULATORY_CARE_PROVIDER_SITE_OTHER): Payer: Medicare HMO | Admitting: Orthopaedic Surgery

## 2018-05-07 ENCOUNTER — Other Ambulatory Visit: Payer: Self-pay

## 2018-05-07 ENCOUNTER — Ambulatory Visit (INDEPENDENT_AMBULATORY_CARE_PROVIDER_SITE_OTHER): Payer: Medicare HMO | Admitting: Psychiatry

## 2018-05-07 ENCOUNTER — Encounter (HOSPITAL_COMMUNITY): Payer: Self-pay | Admitting: Psychiatry

## 2018-05-07 DIAGNOSIS — F4312 Post-traumatic stress disorder, chronic: Secondary | ICD-10-CM | POA: Diagnosis not present

## 2018-05-07 DIAGNOSIS — F250 Schizoaffective disorder, bipolar type: Secondary | ICD-10-CM | POA: Diagnosis not present

## 2018-05-07 NOTE — Telephone Encounter (Signed)
I called patient and told her to take 6 mg for the next few days and then call me to let me know how she is doing.
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
## 2018-05-14 ENCOUNTER — Telehealth (HOSPITAL_COMMUNITY): Payer: Self-pay

## 2018-05-14 ENCOUNTER — Other Ambulatory Visit (HOSPITAL_COMMUNITY): Payer: Self-pay | Admitting: Psychiatry

## 2018-05-14 MED ORDER — CARIPRAZINE HCL 4.5 MG PO CAPS: 4.5000 mg | ORAL_CAPSULE | Freq: Every day | ORAL | 0 refills | Status: DC

## 2018-05-14 NOTE — Telephone Encounter (Signed)
I did send a Rx for 4.5 mg daily # 90 to pharmacy today. I hope she does not start to show EPS.  OP

## 2018-05-14 NOTE — Telephone Encounter (Signed)
Patient called, she said increasing the Arman Filter has helped a lot. She would like to know if you can go up one more time, she feels that would be perfect. She has an appointment on 5/6 and would like you to send in 90 day orders for medications that day as it is cheaper for her

## 2018-05-15 ENCOUNTER — Other Ambulatory Visit: Payer: Self-pay

## 2018-05-15 ENCOUNTER — Encounter (HOSPITAL_COMMUNITY): Payer: Self-pay | Admitting: Psychiatry

## 2018-05-15 ENCOUNTER — Ambulatory Visit (INDEPENDENT_AMBULATORY_CARE_PROVIDER_SITE_OTHER): Payer: Medicare HMO | Admitting: Psychiatry

## 2018-05-15 DIAGNOSIS — F250 Schizoaffective disorder, bipolar type: Secondary | ICD-10-CM | POA: Diagnosis not present

## 2018-05-15 DIAGNOSIS — J45909 Unspecified asthma, uncomplicated: Secondary | ICD-10-CM | POA: Diagnosis not present

## 2018-05-15 DIAGNOSIS — J449 Chronic obstructive pulmonary disease, unspecified: Secondary | ICD-10-CM | POA: Diagnosis not present

## 2018-05-15 DIAGNOSIS — R062 Wheezing: Secondary | ICD-10-CM | POA: Diagnosis not present

## 2018-05-15 DIAGNOSIS — F4312 Post-traumatic stress disorder, chronic: Secondary | ICD-10-CM | POA: Diagnosis not present

## 2018-05-15 DIAGNOSIS — M6281 Muscle weakness (generalized): Secondary | ICD-10-CM | POA: Diagnosis not present
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
## 2018-05-16 DIAGNOSIS — R062 Wheezing: Secondary | ICD-10-CM | POA: Diagnosis not present

## 2018-05-16 DIAGNOSIS — J449 Chronic obstructive pulmonary disease, unspecified: Secondary | ICD-10-CM | POA: Diagnosis not present

## 2018-05-16 DIAGNOSIS — M6281 Muscle weakness (generalized): Secondary | ICD-10-CM | POA: Diagnosis not present

## 2018-05-16 DIAGNOSIS — J45909 Unspecified asthma, uncomplicated: Secondary | ICD-10-CM | POA: Diagnosis not present

## 2018-05-17 ENCOUNTER — Other Ambulatory Visit (HOSPITAL_COMMUNITY): Payer: Self-pay | Admitting: Psychiatry

## 2018-05-17 ENCOUNTER — Telehealth (HOSPITAL_COMMUNITY): Payer: Self-pay

## 2018-05-17 MED ORDER — SAPHRIS 2.5 MG SL SUBL: 2.5000 mg | SUBLINGUAL_TABLET | Freq: Three times a day (TID) | SUBLINGUAL | 0 refills | Status: DC

## 2018-05-17 MED ORDER — ESCITALOPRAM OXALATE 20 MG PO TABS: 40.0000 mg | ORAL_TABLET | Freq: Every day | ORAL | 0 refills | Status: DC

## 2018-05-17 NOTE — Telephone Encounter (Signed)
I called patient back and advised her to continue the medication as prescribed and she can discuss any changes with you at her upcoming appoitnment

## 2018-05-17 NOTE — Telephone Encounter (Signed)
Patient is calling about her medications. She states she was taking two 20 mg Lexapro and the Saphris three times a day. She has a follow up on 5/6 at 11 am and wants to know if you can go back to her previous doses. She wants to keep the Vraylar at 6 mg. Please review and advise, thank you

## 2018-05-17 NOTE — Telephone Encounter (Signed)
I changed prescription for both to previously taken dosing (Lexapro 40 mg, Saphris tid). Prescription for Vraylar that was send to pharmacy lately was for 4.5 mg not 6 mg dose and given increase of Saphris I think she should stay with 4.5 mg for now.  OP

## 2018-05-20 ENCOUNTER — Other Ambulatory Visit (HOSPITAL_COMMUNITY): Payer: Self-pay | Admitting: Psychiatry

## 2018-05-22 ENCOUNTER — Ambulatory Visit (HOSPITAL_COMMUNITY): Payer: Medicare HMO | Admitting: Psychiatry

## 2018-05-22 ENCOUNTER — Ambulatory Visit: Payer: Medicare HMO | Admitting: Family Medicine

## 2018-05-22 DIAGNOSIS — R062 Wheezing: Secondary | ICD-10-CM | POA: Diagnosis not present

## 2018-05-22 DIAGNOSIS — J449 Chronic obstructive pulmonary disease, unspecified: Secondary | ICD-10-CM | POA: Diagnosis not present

## 2018-05-22 DIAGNOSIS — M6281 Muscle weakness (generalized): Secondary | ICD-10-CM | POA: Diagnosis not present

## 2018-05-22 DIAGNOSIS — J45909 Unspecified asthma, uncomplicated: Secondary | ICD-10-CM | POA: Diagnosis not present

## 2018-05-23 ENCOUNTER — Ambulatory Visit (INDEPENDENT_AMBULATORY_CARE_PROVIDER_SITE_OTHER): Payer: Medicare HMO | Admitting: Psychiatry

## 2018-05-23 ENCOUNTER — Encounter (HOSPITAL_COMMUNITY): Payer: Self-pay | Admitting: Psychiatry

## 2018-05-23 ENCOUNTER — Ambulatory Visit: Payer: Self-pay | Admitting: Family Medicine

## 2018-05-23 ENCOUNTER — Other Ambulatory Visit: Payer: Self-pay

## 2018-05-23 DIAGNOSIS — F4312 Post-traumatic stress disorder, chronic: Secondary | ICD-10-CM

## 2018-05-23 DIAGNOSIS — F250 Schizoaffective disorder, bipolar type: Secondary | ICD-10-CM
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
## 2018-05-24 ENCOUNTER — Telehealth: Payer: Self-pay | Admitting: Emergency Medicine

## 2018-05-24 DIAGNOSIS — J42 Unspecified chronic bronchitis: Secondary | ICD-10-CM

## 2018-05-24 DIAGNOSIS — J449 Chronic obstructive pulmonary disease, unspecified: Secondary | ICD-10-CM | POA: Diagnosis not present

## 2018-05-24 MED ORDER — FLUTICASONE-UMECLIDIN-VILANT 100-62.5-25 MCG/INH IN AEPB: 1.0000 | INHALATION_SPRAY | Freq: Every day | RESPIRATORY_TRACT | 3 refills | Status: DC

## 2018-05-24 MED ORDER — ALBUTEROL SULFATE HFA 108 (90 BASE) MCG/ACT IN AERS: 2.0000 | INHALATION_SPRAY | Freq: Four times a day (QID) | RESPIRATORY_TRACT | 2 refills | Status: DC | PRN

## 2018-05-24 NOTE — Telephone Encounter (Signed)
Called patient back [facility]. Made patient aware of her 02 per order inside chart.. Albuterol is listed in March ov notes as prn taking but had been d/c from list. Had to manually add back as pt states she uses this as needed along with nebs. Nothing further needed.

## 2018-05-24 NOTE — Telephone Encounter (Signed)
ATC line rang busy; There is no Albuterol inhaler in med list only neb Albuterol 02 therapy: Route (nasal cannula OR mask): nasal  cannula Liter Flow: 2 Frequency (continuous with stationary and portable oxygen unit needed OR only at night): continuous and POC Length of need: Lifetime Was this based off an ONO?: no DME: [employer] Oxygen Conserving Device (Yes or No): yes Type of Oxygen: Gas02 therapy:

## 2018-05-25 DIAGNOSIS — G4733 Obstructive sleep apnea (adult) (pediatric): Secondary | ICD-10-CM | POA: Diagnosis not present

## 2018-05-25 DIAGNOSIS — R062 Wheezing: Secondary | ICD-10-CM | POA: Diagnosis not present

## 2018-05-25 DIAGNOSIS — J45909 Unspecified asthma, uncomplicated: Secondary | ICD-10-CM | POA: Diagnosis not present

## 2018-05-27 ENCOUNTER — Ambulatory Visit (INDEPENDENT_AMBULATORY_CARE_PROVIDER_SITE_OTHER): Payer: Medicare HMO | Admitting: Psychiatry

## 2018-05-27 ENCOUNTER — Other Ambulatory Visit: Payer: Self-pay

## 2018-05-27 DIAGNOSIS — F5081 Binge eating disorder: Secondary | ICD-10-CM | POA: Diagnosis not present

## 2018-05-27 DIAGNOSIS — F4310 Post-traumatic stress disorder, unspecified: Secondary | ICD-10-CM | POA: Insufficient documentation

## 2018-05-27 DIAGNOSIS — F4312 Post-traumatic stress disorder, chronic: Secondary | ICD-10-CM | POA: Diagnosis not present

## 2018-05-27 DIAGNOSIS — F1421 Cocaine dependence, in remission: Secondary | ICD-10-CM | POA: Diagnosis not present

## 2018-05-27 DIAGNOSIS — F250 Schizoaffective disorder, bipolar type: Secondary | ICD-10-CM

## 2018-05-27 HISTORY — DX: Post-traumatic stress disorder, chronic: F43.12

## 2018-05-27 MED ORDER — TRAZODONE HCL 100 MG PO TABS: 200.0000 mg | ORAL_TABLET | Freq: Every day | ORAL | 0 refills | Status: DC

## 2018-05-27 MED ORDER — DIAZEPAM 10 MG PO TABS: 10.0000 mg | ORAL_TABLET | Freq: Three times a day (TID) | ORAL | 0 refills | Status: DC

## 2018-05-27 MED ORDER — LISDEXAMFETAMINE DIMESYLATE 70 MG PO CAPS: 70.0000 mg | ORAL_CAPSULE | Freq: Every day | ORAL | 0 refills | Status: DC

## 2018-05-27 MED ORDER — SAPHRIS 2.5 MG SL SUBL: 2.5000 mg | SUBLINGUAL_TABLET | Freq: Three times a day (TID) | SUBLINGUAL | 0 refills | Status: DC

## 2018-05-27 MED ORDER — ESCITALOPRAM OXALATE 20 MG PO TABS: 40.0000 mg | ORAL_TABLET | Freq: Every day | ORAL | 0 refills | Status: DC

## 2018-05-27 NOTE — Progress Notes (Signed)
[facility] MD/PA/NP OP Progress Note  05/27/2018 8:25 AM Tiffany [name]  MRN:  [id number] Interview was conducted by telephone (no access to WebEx) and I verified that I was speaking with the correct person using two identifiers. I discussed the limitations of evaluation and management by telemedicine and  the availability of in person appointments. Patient expressed understanding and agreed to proceed.  Chief Complaint: Anxiety  HPI: 58 yo divorced AAF with schizoaffective disorder bipolar type (vs paranoid schizophrenia), remote hx ofcocaine addiction (clean for 3 years) and newly diagnosed bing eating disorder.[name] a former patient of Dr. [name] whom she saw last time in February 2019. She then started to see a new psychiatrist Dr. Frederico Hamman at [facility]. He changed her Geodon (she had QTC prolongation on it) to Saphris, mirtazapine was discontinued whereas high dose (40 mg) escitalopram continued and added Vyvanse for binge eating disorder. Tiffany [name] used to weigh over 300 lbs and since starting Vyvanse her binge eating is well controlled and she was able to lose a lot of weight today she weighs 156 lbs). She continues to take diazepam 10 mg tid and trazodone for sleep (150 mg plus 50 mg in am "for nerves"). She reported feeling emotionally stable and wanting to continue on current psychotropic medications without any changes during her initial visit in March. Later she reported increased paranoia and requested medication adjustments to address that. She wanted another antipsychotic in addition to low dose of Saphris she is already on. Given her obesity many are not a good choice. She was on Geodon in the past and reports not tolerating it well.  [name] also diabetic and has COPD, We added Vrylar 3 mg Then 4.5 mg) daily and continue Saphris 2.5 mg bid (was on tid). She now says she did not tolerate Vraylar (apparently developed some kind of skin infection and nail fungus) and stopped taking it some time  ago. She instead resumed taking Saphris tid and feels more emotionally stable again. [name] in weekly counseling with Lise Auer LCSW. Recently moved to a different place but does not like it and move was a traumatic experience for her.   Visit Diagnosis:    ICD-10-CM   1. Schizoaffective disorder, bipolar type ([place]) F25.0   2. Chronic posttraumatic stress disorder F43.12   3. Binge eating disorder F50.81   4. Cocaine use disorder, severe, in sustained remission ([place]) F14.21     Past Psychiatric History: Please see intake H&P.  Past Medical History:  Past Medical History:  Diagnosis Date  . Anxiety   . Arthritis    "all over" (04/10/2016)  . Asthma   . Cardiac arrest ([place]) 09/08/2016   PEA  . Carotid artery stenosis    1-39% bilateral by dopplers 11/2016  . Chronic bronchitis ([place])   . Chronic diastolic (congestive) heart failure ([place])   . Chronic kidney disease    "I see a kidney dr." (04/10/2016)  . Cocaine abuse ([place])   . Complication of anesthesia    decreased bp, decreased heart rate  . Depression   . Disorder of nervous system   . Emphysema   . GERD (gastroesophageal reflux disease)   . Heart attack ([place]) 1980s  . History of blood transfusion 1994   "couldn't stop bleeding from my period"  . Hyperlipidemia LDL goal <70   . Hypertension   . Incontinence   . Manic depression ([place])   . On home oxygen therapy    "6L; 24/7" (04/10/2016)  .  OSA on CPAP    "wear mask sometimes" (04/10/2016)  . Paranoid ([place])    "sometimes; I'm on RX for it" (04/10/2016)  . Pneumonia    "I've had it several times; haven't had it since 06/2015" (04/10/2016)  . Schizophrenia ([place])   . Seasonal allergies   . Seizures ([place])    "don't know what kind; last one was ~ 1 yr ago" (04/10/2016)  . Sinus trouble   . Stroke [facility]) 1980s   denies residual on 04/10/2016  . Type II diabetes mellitus ([place])     Past Surgical History:  Procedure Laterality Date  . CESAREAN SECTION  1997  . HERNIA  REPAIR    . IR CHOLANGIOGRAM EXISTING TUBE  07/20/2016  . IR PERC CHOLECYSTOSTOMY  05/10/2016  . IR RADIOLOGIST EVAL & MGMT  06/08/2016  . IR RADIOLOGIST EVAL & MGMT  06/29/2016  . IR SINUS/FIST TUBE CHK-NON GI  07/12/2016  . RIGHT/LEFT HEART CATH AND CORONARY ANGIOGRAPHY N/A 06/19/2017   Procedure: RIGHT/LEFT HEART CATH AND CORONARY ANGIOGRAPHY;  Surgeon: Jolaine Artist, MD;  Location: [facility];  Service: Cardiovascular;  Laterality: N/A;  . TIBIA IM NAIL INSERTION Right 07/12/2016   Procedure: INTRAMEDULLARY (IM) NAIL RIGHT TIBIA;  Surgeon: Leandrew Koyanagi, MD;  Location: [facility];  Service: Orthopedics;  Laterality: Right;  . UMBILICAL HERNIA REPAIR  ~ 1963   "that's why I don't have a belly button"  . VAGINAL HYSTERECTOMY      Family Psychiatric History: reviewed  Family History:  Family History  Problem Relation Age of Onset  . Cancer Father        prostate  . Cancer Mother        lung  . Depression Mother   . Depression Sister   . Anxiety disorder Sister   . Schizophrenia Sister   . Bipolar disorder Sister   . Depression Sister   . Depression Brother   . Heart failure Other        cousin    Social History:  Social History   Socioeconomic History  . Marital status: Widowed    Spouse name: Not on file  . Number of children: 3  . Years of education: Not on file  . Highest education level: Not on file  Occupational History  . 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 Financial resource strain: Not on file  . Food insecurity:    Worry: Sometimes true    Inability: Never true  . Transportation needs:    Medical: No    Non-medical: No  Tobacco Use  . Smoking status: Former Smoker    Packs/day: 1.50    Years: 38.00    Pack years: 57.00    Types: Cigarettes    Start date: 03/13/1977    Last attempt to quit: 04/10/2016    Years since quitting: 2.1  . Smokeless tobacco: Never Used  Substance and Sexual Activity  . Alcohol use: No     Alcohol/week: 0.0 standard drinks  . Drug use: No    Types: Cocaine    Comment: 04/10/2016 "last used cocaine back in November 2017"  . Sexual activity: Not Currently    Birth control/protection: Surgical  Lifestyle  . Physical activity:    Days per week: Not on file    Minutes per session: Not on file  . Stress: Not on file  Relationships  . Social connections:    Talks on phone: Not on file  Gets together: Not on file    Attends religious service: Not on file    Active member of club or organization: Not on file    Attends meetings of clubs or organizations: Not on file    Relationship status: Not on file  Other Topics Concern  . Not on file  Social History Narrative   Has 1 son, [name]   Lives with son and his boyfriend   Her house has ramps and handrails should she ever needs them.    Her mother lives down the street from her and is a good support person in addition to her son.   She drives herself, has private transportation.    Cocaine free since 02/24/16, smoke free since 04/10/16    Allergies:  Allergies  Allergen Reactions  . Hydrocodone-Acetaminophen Shortness Of Breath  . Hydroxyzine Anaphylaxis and Shortness Of Breath  . Latuda [Lurasidone Hcl] Anaphylaxis  . Magnesium-Containing Compounds Anaphylaxis  . Prednisone Anaphylaxis, Swelling and Other (See Comments)    Tongue swelling  . Tramadol Anaphylaxis  . Codeine Nausea And Vomiting  . Sulfa Antibiotics Itching  . Tape Rash    Metabolic Disorder Labs: Lab Results  Component Value Date   HGBA1C 4.7 (L) 04/05/2018   MPG 125.5 01/07/2017   MPG 111.15 11/03/2016   No results found for: PROLACTIN Lab Results  Component Value Date   CHOL 129 01/07/2017   TRIG 121 11/21/2017   HDL 35 (L) 01/07/2017   CHOLHDL 3.7 01/07/2017   VLDL 38 01/07/2017   LDLCALC 56 01/07/2017   LDLCALC 44 07/10/2016   Lab Results  Component Value Date   TSH 0.563 03/15/2016   TSH 1.060 12/04/2015    Therapeutic Level  Labs: No results found for: LITHIUM Lab Results  Component Value Date   VALPROATE 20 (L) 09/09/2016   VALPROATE 46 (L) 07/10/2016   No components found for:  CBMZ  Current Medications: Current Outpatient Medications  Medication Sig Dispense Refill  . albuterol (PROVENTIL) (2.5 MG/3ML) 0.083% nebulizer solution Take 3 mLs (2.5 mg total) by nebulization every 6 (six) hours as needed for wheezing or shortness of breath. 150 mL 1  . albuterol (VENTOLIN HFA) 108 (90 Base) MCG/ACT inhaler Inhale 2 puffs into the lungs every 6 (six) hours as needed for wheezing or shortness of breath. 1 Inhaler 2  . amLODipine (NORVASC) 5 MG tablet Take 5 mg by mouth daily.    [name] aspirin (GOODSENSE ASPIRIN LOW DOSE) 81 MG EC tablet TAKE 1 TABLET (81 MG TOTAL) BY MOUTH DAILY (MORNING). (Patient taking differently: Take 81 mg by mouth daily. ) 90 tablet 3  . colchicine (COLCRYS) 0.6 MG tablet TAKE 1 TABLET (0.6 MG TOTAL) BY MOUTH 2 (TWO) TIMES DAILY. (Patient taking differently: Take 0.6 mg by mouth 2 (two) times daily. ) 180 tablet 3  . diazepam (VALIUM) 10 MG tablet Take 10 mg by mouth 3 (three) times daily.  1  . diclofenac sodium (VOLTAREN) 1 % GEL Apply 2 g topically 4 (four) times daily. (Patient taking differently: Apply 2 g topically 4 (four) times daily as needed (pain). ) 100 g 0  . escitalopram (LEXAPRO) 20 MG tablet Take 2 tablets (40 mg total) by mouth daily for 30 days. 60 tablet 0  . febuxostat (ULORIC) 40 MG tablet Take 1 tablet by mouth daily.    . ferrous sulfate (FEROSUL) 325 (65 FE) MG tablet TAKE 1 TABLET BY MOUTH DAILY.(MORNING) (Patient taking differently: Take 325 mg by mouth daily with  breakfast. ) 90 tablet 3  . flunisolide (NASALIDE) 25 MCG/ACT (0.025%) SOLN Place 2 sprays into the nose daily. 1 Bottle 2  . Fluticasone-Umeclidin-Vilant (TRELEGY ELLIPTA) 100-62.5-25 MCG/INH AEPB Inhale 1 puff into the lungs daily. 60 each 3  . gabapentin (NEURONTIN) 600 MG tablet Take 1 tablet (600 mg total)  by mouth 2 (two) times daily. (Patient taking differently: Take 1,200 mg by mouth 3 (three) times daily. ) 360 tablet 0  . lisdexamfetamine (VYVANSE) 70 MG capsule Take 1 capsule (70 mg total) by mouth daily for 30 days. 30 capsule 0  . metolazone (ZAROXOLYN) 2.5 MG tablet Take 1 tablet (2.5 mg total) by mouth daily. 90 tablet 0  . miconazole (MICONAZOLE 7) 2 % vaginal cream Place 1 Applicatorful vaginally at bedtime. 45 g 0  . montelukast (SINGULAIR) 10 MG tablet Take 1 tablet (10 mg total) by mouth at bedtime. 90 tablet 3  . Multiple Vitamin (MULTIVITAMIN WITH MINERALS) TABS tablet Take 1 tablet by mouth daily.    [name] omeprazole (PRILOSEC) 40 MG capsule Take 1 capsule (40 mg total) by mouth 2 (two) times daily. 180 capsule 3  . oxybutynin (DITROPAN-XL) 10 MG 24 hr tablet TAKE 1 TABLET BY MOUTH EVERYDAY AT BEDTIME (Patient taking differently: Take 10 mg by mouth at bedtime. ) 90 tablet 3  . potassium chloride (K-DUR) 10 MEQ tablet Take 6 tablets by mouth 2 (two) times a day.    [name] SAPHRIS 2.5 MG SUBL Place 1 tablet (2.5 mg total) under the tongue 3 (three) times daily for 30 days. 90 tablet 0  . terconazole (TERAZOL 3) 0.8 % vaginal cream Apply pea sized amount to clitoris nightly. 20 g 0  . torsemide (DEMADEX) 20 MG tablet Take 4 tablets (80 mg total) by mouth 2 (two) times daily. 240 tablet 4  . traZODone (DESYREL) 100 MG tablet Take 2 tablets (200 mg total) by mouth at bedtime. 180 tablet 0  . zinc oxide 20 % ointment Apply 1 application topically as needed for irritation. 56.7 g 5   No current facility-administered medications for this visit.     Psychiatric Specialty Exam: Review of Systems  Respiratory: Positive for shortness of breath.   Psychiatric/Behavioral: The patient is nervous/anxious.   All other systems reviewed and are negative.   There were no vitals taken for this visit.There is no height or weight on file to calculate BMI.  General Appearance: NA  Eye Contact:  NA   Speech:  Clear and Coherent  Volume:  Normal  Mood:  Anxious  Affect:  NA  Thought Process:  Goal Directed  Orientation:  Full (Time, Place, and Person)  Thought Content: Illogical   Suicidal Thoughts:  No  Homicidal Thoughts:  No  Memory:  Immediate;   Good Recent;   Good Remote;   Good  Judgement:  Fair  Insight:  Fair  Psychomotor Activity:  NA  Concentration:  Concentration: Good  Recall:  Good  Fund of Knowledge: Fair  Language: Good  Akathisia:  NA  Handed:  Right  AIMS (if indicated): not done  Assets:  Communication Skills Desire for Improvement Housing Resilience Transportation  ADL's:  Intact  Cognition: WNL  Sleep:  Good   Screenings: AUDIT     Admission (Discharged) from 12/01/2015 in [facility]  Alcohol Use Disorder Identification Test Final Score (AUDIT)  0    GAD-7     Office Visit from 03/21/2018 in [place] for [facility]  Total GAD-7 Score  4    PHQ2-9     Office Visit from 03/21/2018 in [facility] for [facility] Office Visit from 02/07/2018 in [facility] Office Visit from 12/31/2017 in [facility] Office Visit from 11/27/2017 in [facility] Office Visit from 11/12/2017 in [facility]  PHQ-2 Total Score  2  0  0  0  0  PHQ-9 Total Score  2  -  -  -  -       Assessment and Plan: 58 yo divorced AAF with schizoaffective disorder bipolar type (vs paranoid schizophrenia), PTSD, remote hx ofcocaine addiction (clean for 3 years) and newly diagnosed bing eating disorder.[name] a former patient of Dr. [name] whom she saw last time in February 2019. She then started to see a new psychiatrist Dr. Frederico Hamman at [facility]. He changed her Geodon (she had QTC prolongation on it) to Saphris, mirtazapine was discontinued whereas high dose (40 mg) escitalopram continued and added Vyvanse for binge eating disorder. Tiffany [name]  used to weigh over 300 lbs and since starting Vyvanse her binge eating is well controlled and she was able to lose a lot of weight today she weighs 156 lbs). She continues to take diazepam 10 mg tid and trazodone for sleep (150 mg plus 50 mg in am "for nerves"). She reported feeling emotionally stable and wanting to continue on current psychotropic medications without any changes during her initial visit in March. Later she reported increased paranoia and requested medication adjustments to address that. She wanted another antipsychotic in addition to low dose of Saphris she is already on. Given her obesity many are not a good choice. She was on Geodon in the past and reports not tolerating it well.  [name] also diabetic and has COPD, We added Vrylar 3 mg Then 4.5 mg) daily and continue Saphris 2.5 mg bid (was on tid). She now says she did not tolerate Vraylar (apparently developed some kind of skin infection and nail fungus) and stopped taking it some time ago. She instead resumed taking Saphris tid and feels more emotionally stable again. [name] in weekly counseling with Lise Auer LCSW. Recently moved to a different place but does not like it and move was a traumatic experience for her.  Plan: Continue all medications unchanged except for Vraylar which is discontinued. Next visit in one month.    Stephanie Acre, MD 05/27/2018, 8:25 AM

## 2018-05-29 ENCOUNTER — Ambulatory Visit (INDEPENDENT_AMBULATORY_CARE_PROVIDER_SITE_OTHER): Payer: Medicare HMO | Admitting: Psychiatry

## 2018-05-29 ENCOUNTER — Other Ambulatory Visit: Payer: Self-pay

## 2018-05-29 DIAGNOSIS — F250 Schizoaffective disorder, bipolar type: Secondary | ICD-10-CM

## 2018-05-29 DIAGNOSIS — F4312 Post-traumatic stress disorder, chronic: Secondary | ICD-10-CM

## 2018-05-30 ENCOUNTER — Encounter (HOSPITAL_COMMUNITY): Payer: Self-pay | Admitting: Psychiatry

## 2018-05-30 DIAGNOSIS — J449 Chronic obstructive pulmonary disease, unspecified: Secondary | ICD-10-CM | POA: Diagnosis not present

## 2018-05-30 DIAGNOSIS — J9611 Chronic respiratory failure with hypoxia: Secondary | ICD-10-CM | POA: Diagnosis not present

## 2018-05-30 DIAGNOSIS — J9612 Chronic respiratory failure with hypercapnia: Secondary | ICD-10-CM | POA: Diagnosis not present
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
## 2018-05-31 DIAGNOSIS — M542 Cervicalgia: Secondary | ICD-10-CM | POA: Diagnosis not present

## 2018-05-31 DIAGNOSIS — M79642 Pain in left hand: Secondary | ICD-10-CM | POA: Diagnosis not present

## 2018-05-31 DIAGNOSIS — M79645 Pain in left finger(s): Secondary | ICD-10-CM | POA: Diagnosis not present

## 2018-05-31 DIAGNOSIS — M25512 Pain in left shoulder: Secondary | ICD-10-CM | POA: Diagnosis not present

## 2018-06-03 DIAGNOSIS — F439 Reaction to severe stress, unspecified: Secondary | ICD-10-CM | POA: Diagnosis not present

## 2018-06-03 DIAGNOSIS — F329 Major depressive disorder, single episode, unspecified: Secondary | ICD-10-CM | POA: Diagnosis not present

## 2018-06-03 DIAGNOSIS — F1420 Cocaine dependence, uncomplicated: Secondary | ICD-10-CM | POA: Diagnosis not present

## 2018-06-03 IMAGING — CR DG CHEST 1V PORT
1 series · 1 of 1 positions shown · non-contrast
Comparison: Prior radiograph from 02/14/2015.

CLINICAL DATA: Initial evaluation for acute shortness of breath.

EXAM:
PORTABLE CHEST 1 VIEW

[AP]
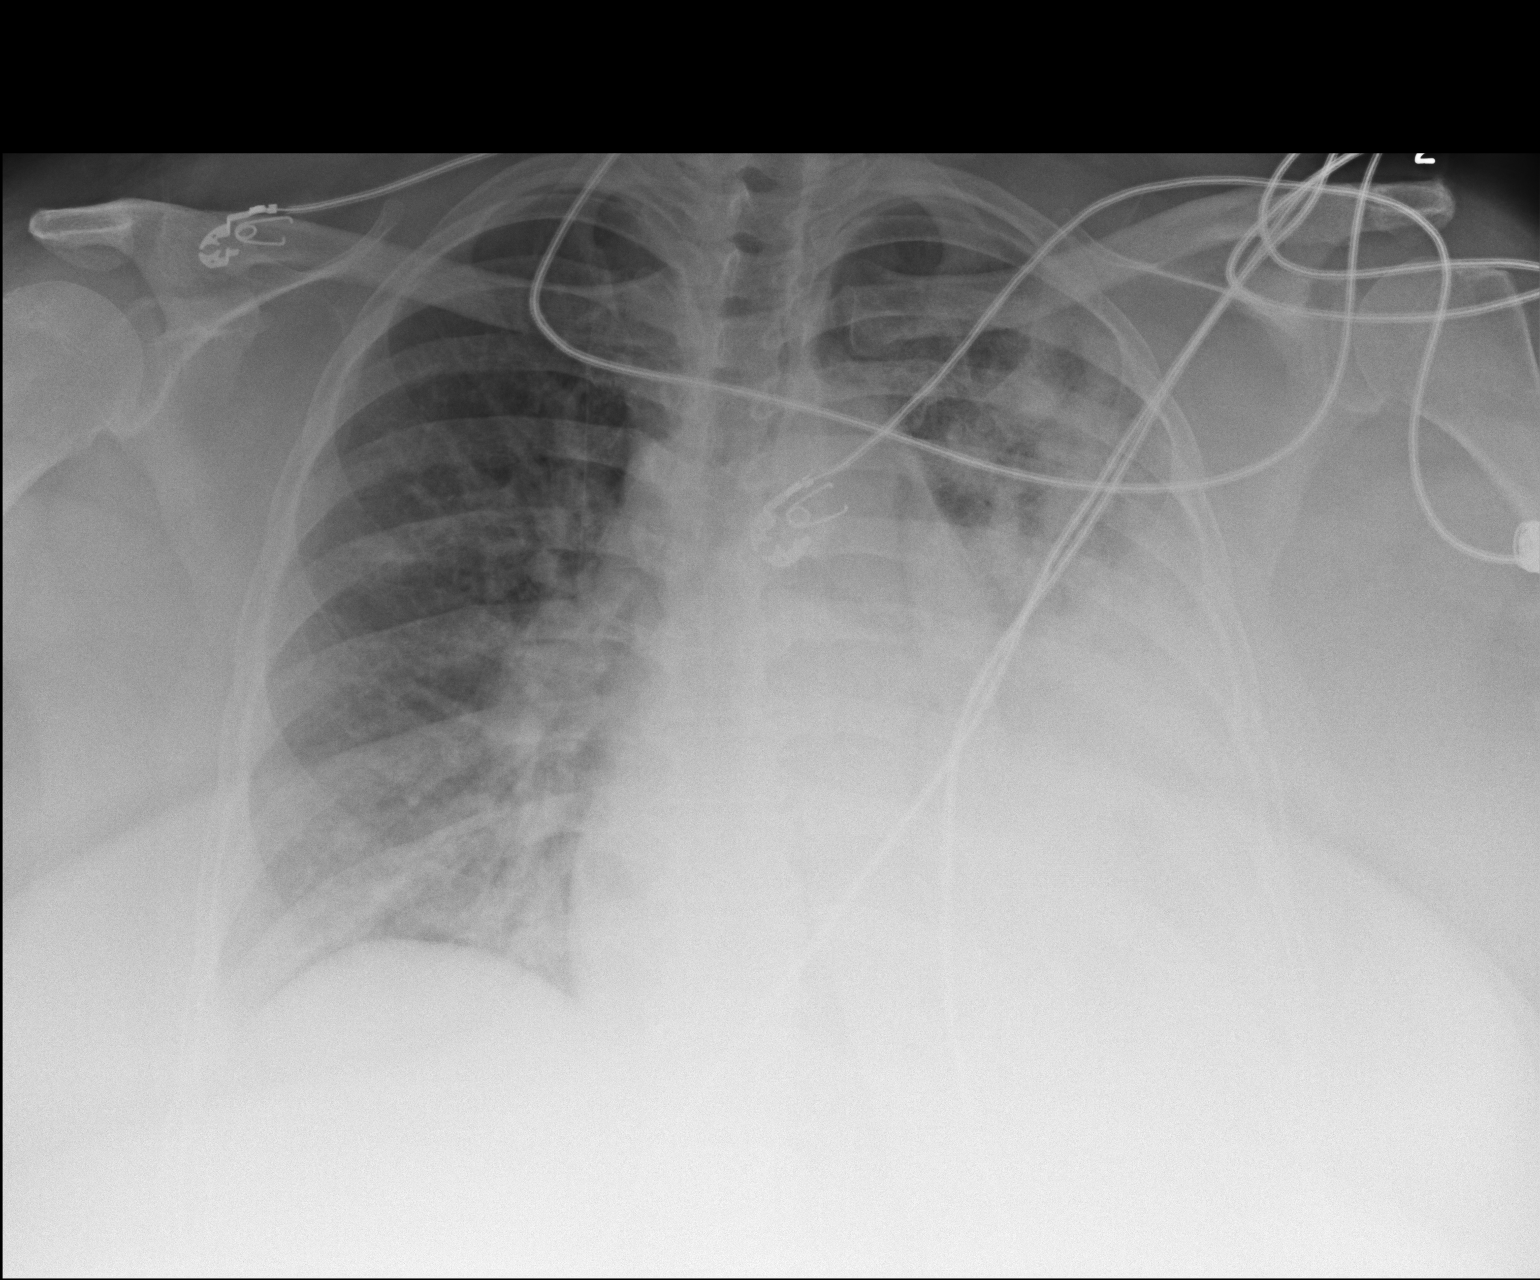

[1 of 1 positions shown; findings below may reference images not displayed]

FINDINGS: Mild cardiomegaly is stable from previous. Mediastinal silhouette
within normal limits.

There are extensive patchy opacities throughout the left mid and
lower lung, concerning for pneumonia. A component of superimposed
atelectasis may be present. Additional small focus of patchy opacity
within the inferior right upper lobe noted. Right lung is otherwise
grossly clear. Mild vascular congestion without overt pulmonary
edema. No definite pleural effusion. No pneumothorax.

No acute osseus abnormality.
IMPRESSION: 1. Extensive opacity throughout the mid and lower left lung,
concerning for pneumonia.
2. Grossly stable cardiomegaly with pulmonary vascular congestion
without overt pulmonary edema.

## 2018-06-04 ENCOUNTER — Other Ambulatory Visit (HOSPITAL_COMMUNITY): Payer: Self-pay

## 2018-06-04 ENCOUNTER — Telehealth (HOSPITAL_COMMUNITY): Payer: Self-pay

## 2018-06-04 DIAGNOSIS — S6992XD Unspecified injury of left wrist, hand and finger(s), subsequent encounter: Secondary | ICD-10-CM | POA: Diagnosis not present

## 2018-06-04 DIAGNOSIS — M79645 Pain in left finger(s): Secondary | ICD-10-CM | POA: Diagnosis not present

## 2018-06-04 DIAGNOSIS — M542 Cervicalgia: Secondary | ICD-10-CM | POA: Diagnosis not present

## 2018-06-04 MED ORDER — LISDEXAMFETAMINE DIMESYLATE 70 MG PO CAPS: 70.0000 mg | ORAL_CAPSULE | Freq: Every day | ORAL | 0 refills | Status: DC

## 2018-06-04 NOTE — Telephone Encounter (Signed)
[place] patient - patient has moved to [place]. She had a [facility] [place] [facility], she [facility] [place] (I have updated in the chart) I have already [facility] here [facility] [place] and discontinued the RX they had. Please resend to new pharmacy, thank you

## 2018-06-04 NOTE — Telephone Encounter (Signed)
done

## 2018-06-05 ENCOUNTER — Encounter (HOSPITAL_COMMUNITY): Payer: Self-pay | Admitting: Psychiatry

## 2018-06-05 ENCOUNTER — Ambulatory Visit (INDEPENDENT_AMBULATORY_CARE_PROVIDER_SITE_OTHER): Payer: Medicare HMO | Admitting: Psychiatry

## 2018-06-05 ENCOUNTER — Other Ambulatory Visit: Payer: Self-pay

## 2018-06-05 DIAGNOSIS — F250 Schizoaffective disorder, bipolar type: Secondary | ICD-10-CM | POA: Diagnosis not present

## 2018-06-05 DIAGNOSIS — F4312 Post-traumatic stress disorder, chronic: Secondary | ICD-10-CM | POA: Diagnosis not present
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
## 2018-06-06 IMAGING — CR DG CHEST 1V PORT
1 series · 1 of 1 positions shown · non-contrast
Comparison: Portable chest x-ray June 21, 2015

CLINICAL DATA: Acute respiratory failure, community-acquired
pneumonia, history of asthma, diabetes

EXAM:
PORTABLE CHEST 1 VIEW

[AP]
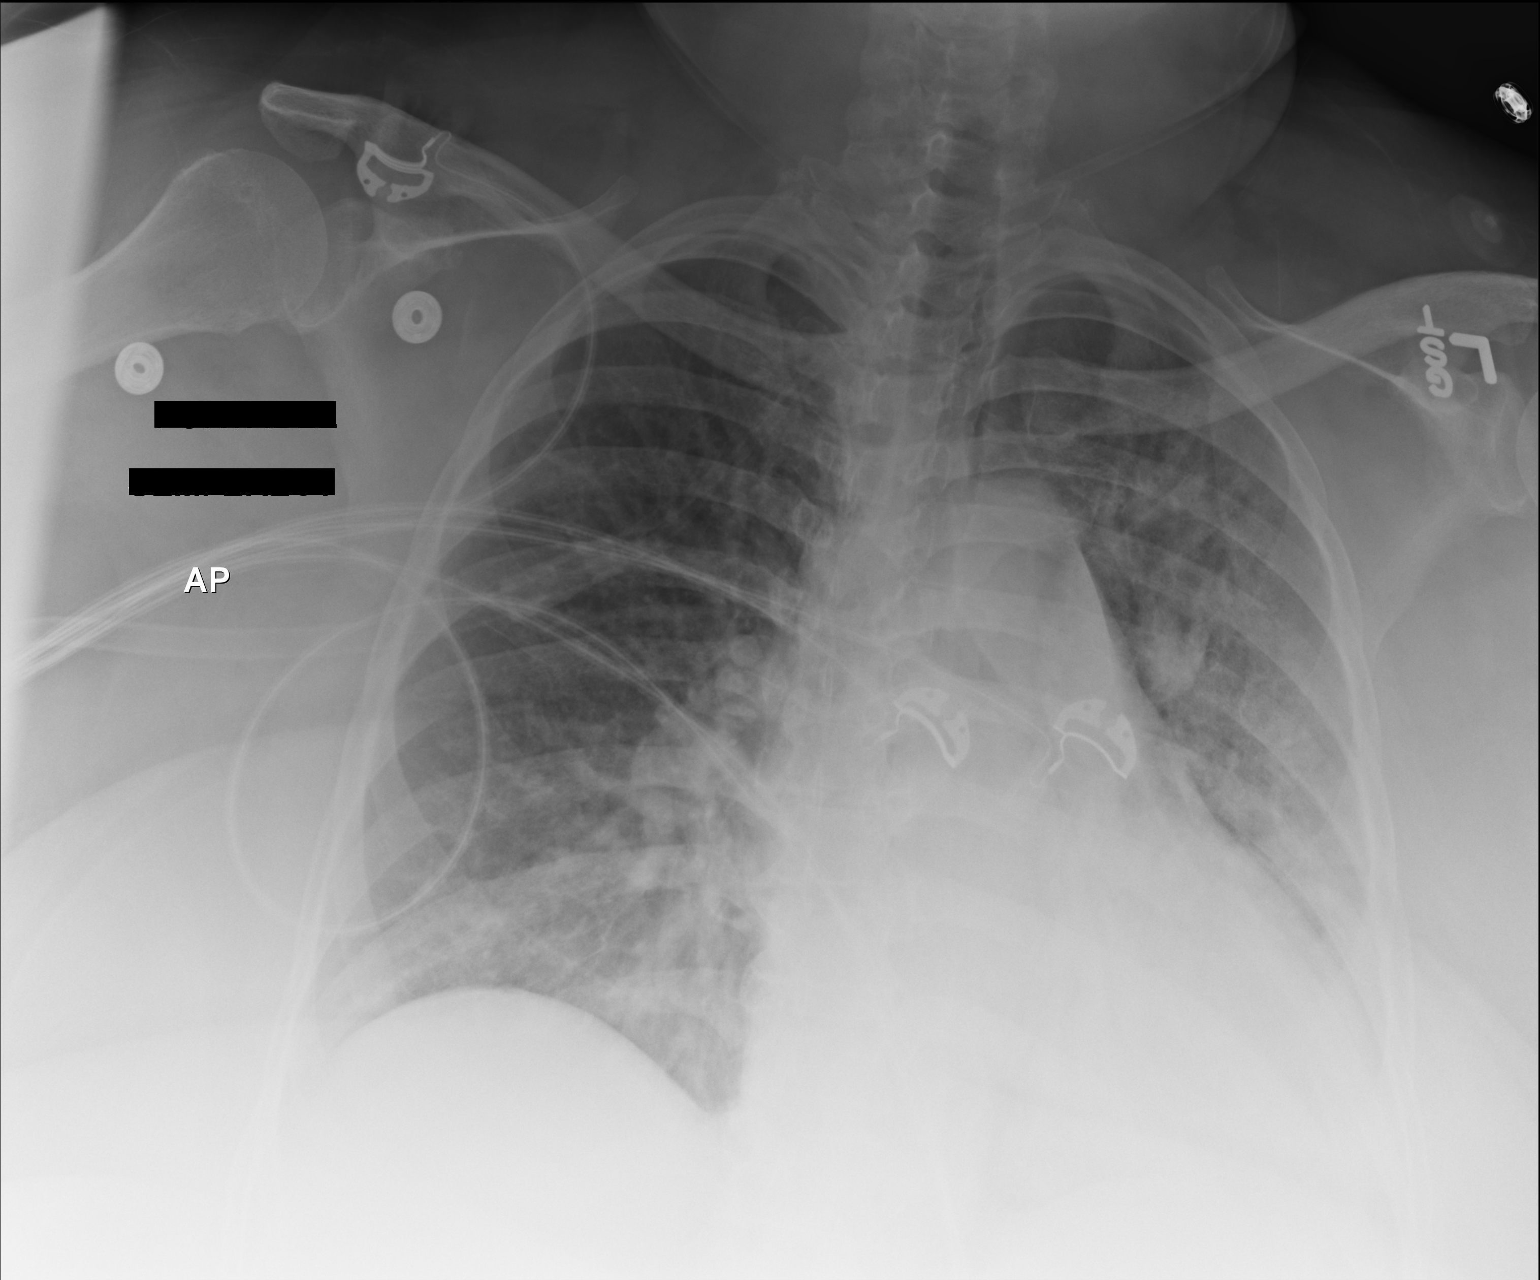

[1 of 1 positions shown; findings below may reference images not displayed]

FINDINGS: There is improved aeration of the left lung. There is persistent
retrocardiac density in obscuration of the left hemidiaphragm. There
is right basilar atelectasis or infiltrate. The cardiac silhouette
remains enlarged. The central pulmonary vascularity remains
prominent. There is no pneumothorax.
IMPRESSION: Decreasing atelectasis or pneumonia in the left lung. Persistent
interstitial infiltrates or mild asymmetric pulmonary edema
bilaterally.

## 2018-06-07 IMAGING — DX DG CHEST 2V
2 series · 2 of 2 positions shown · non-contrast
Comparison: 06/24/2015

CLINICAL DATA: Community acquired pneumonia. Nonproductive cough
and shortness breath for 1 week. Asthma.

EXAM:
CHEST  2 VIEW

[chest lat]
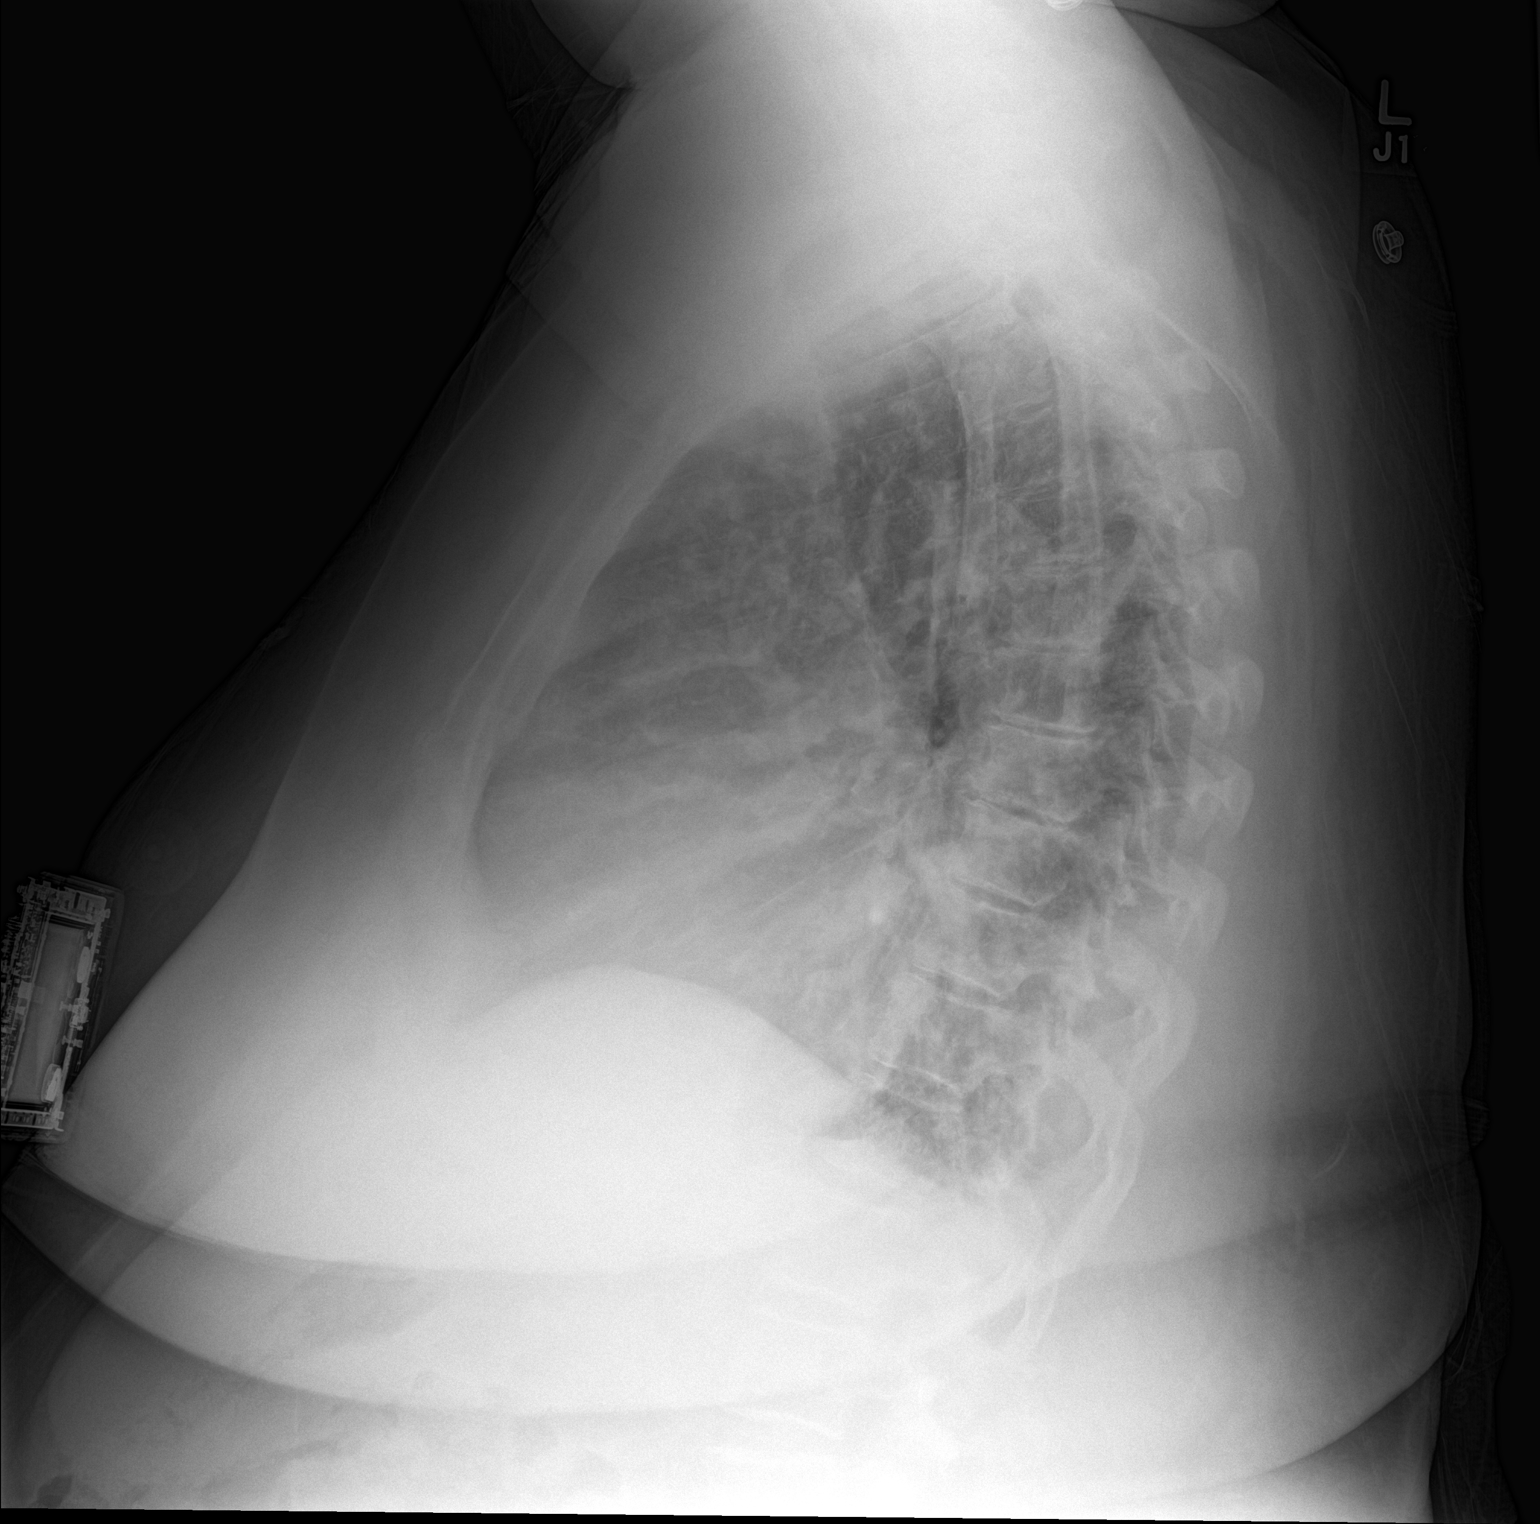

[chest pa]
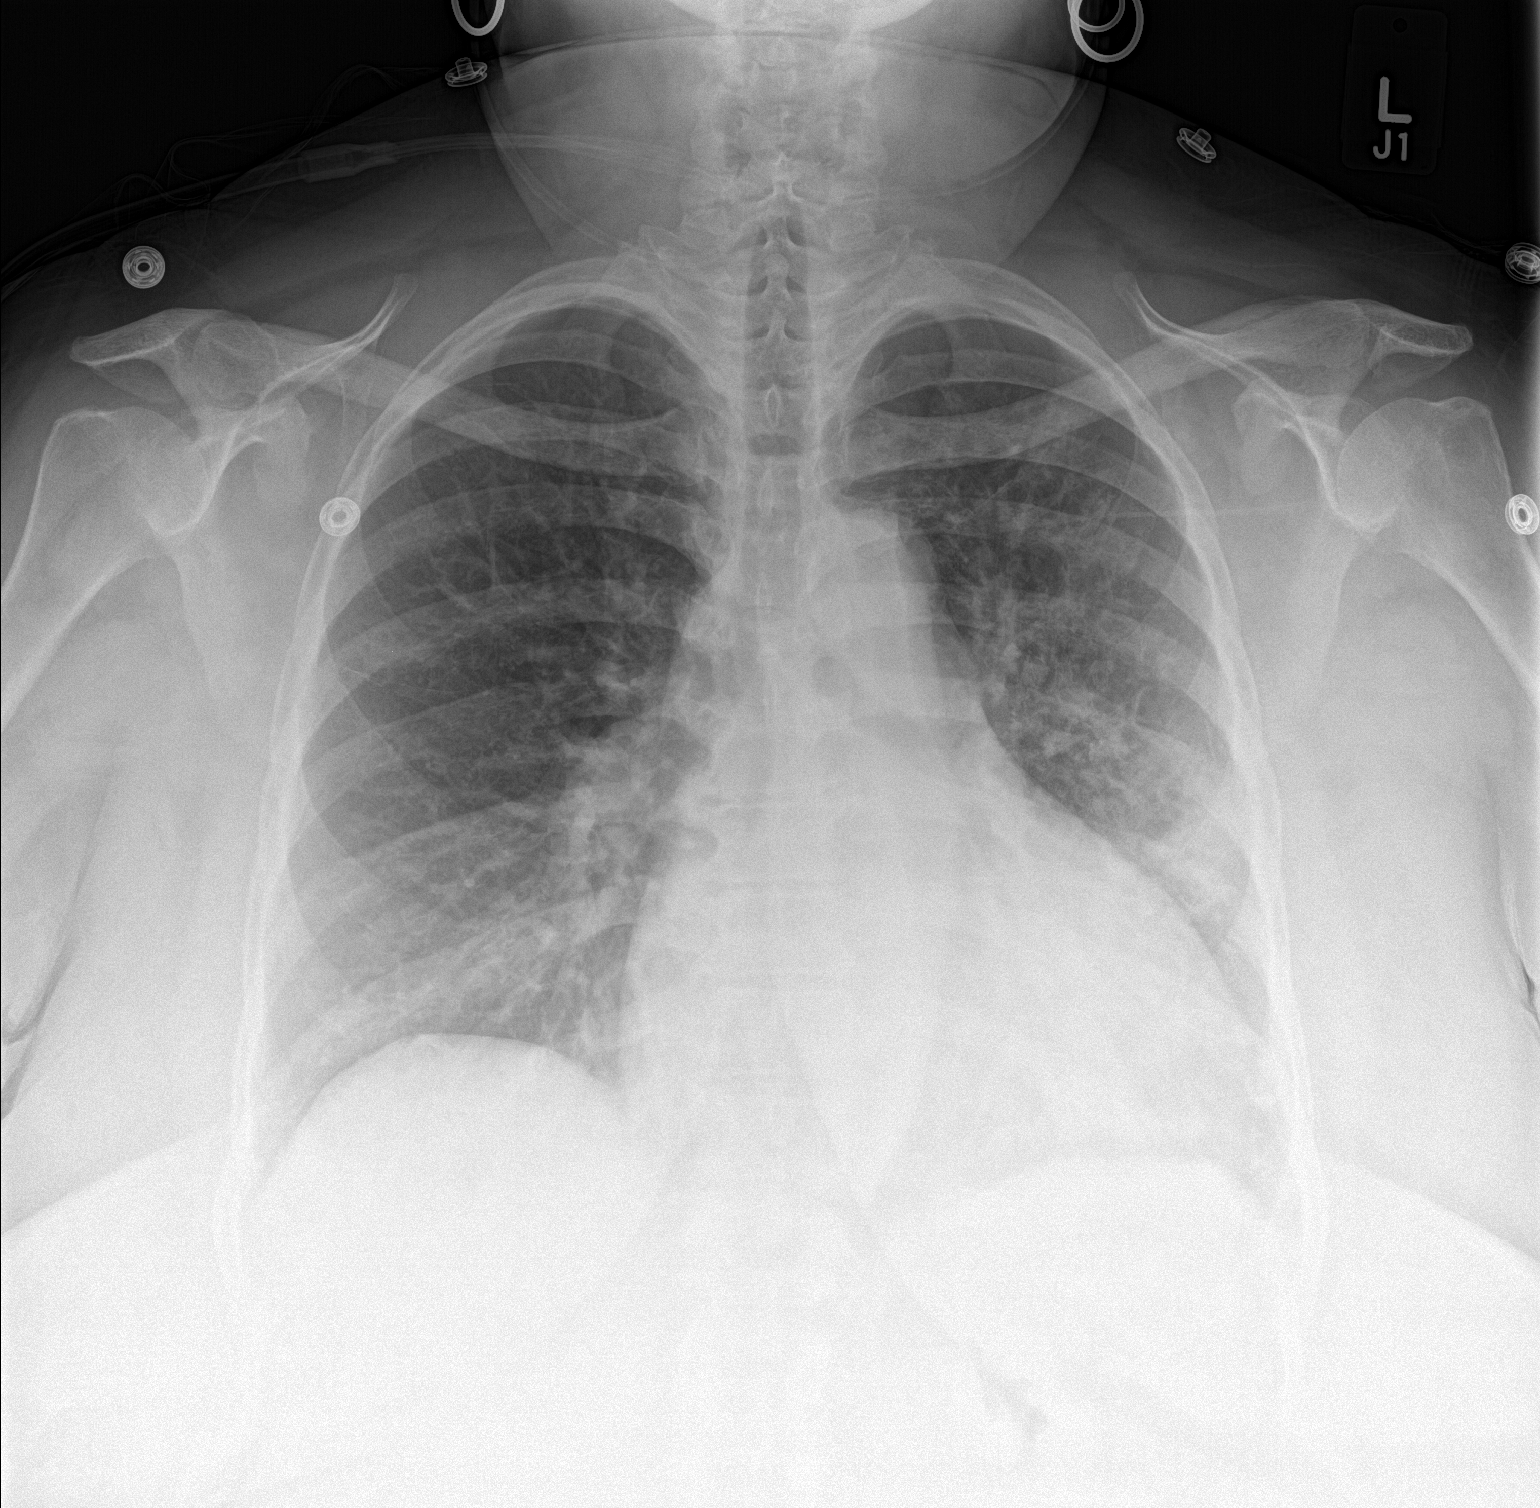

[2 of 2 positions shown; findings below may reference images not displayed]

FINDINGS: Mild cardiomegaly stable. Decrease interstitial prominence seen
bilaterally. There is asymmetric airspace disease in the left lower
lung, which also shows improvement common is suspicious for
improving pneumonia.
IMPRESSION: Decreased bilateral interstitial prominence, and decreased left
lower lung airspace disease. Stable mild cardiomegaly.

## 2018-06-07 IMAGING — DX DG CHEST DECUBITUS*L*
1 series · 1 of 1 positions shown · non-contrast
Comparison: 06/24/2015

CLINICAL DATA: Dry cough, shortness of breath. Community acquired
pneumonia.

EXAM:
CHEST - LEFT DECUBITUS

[chest decu]
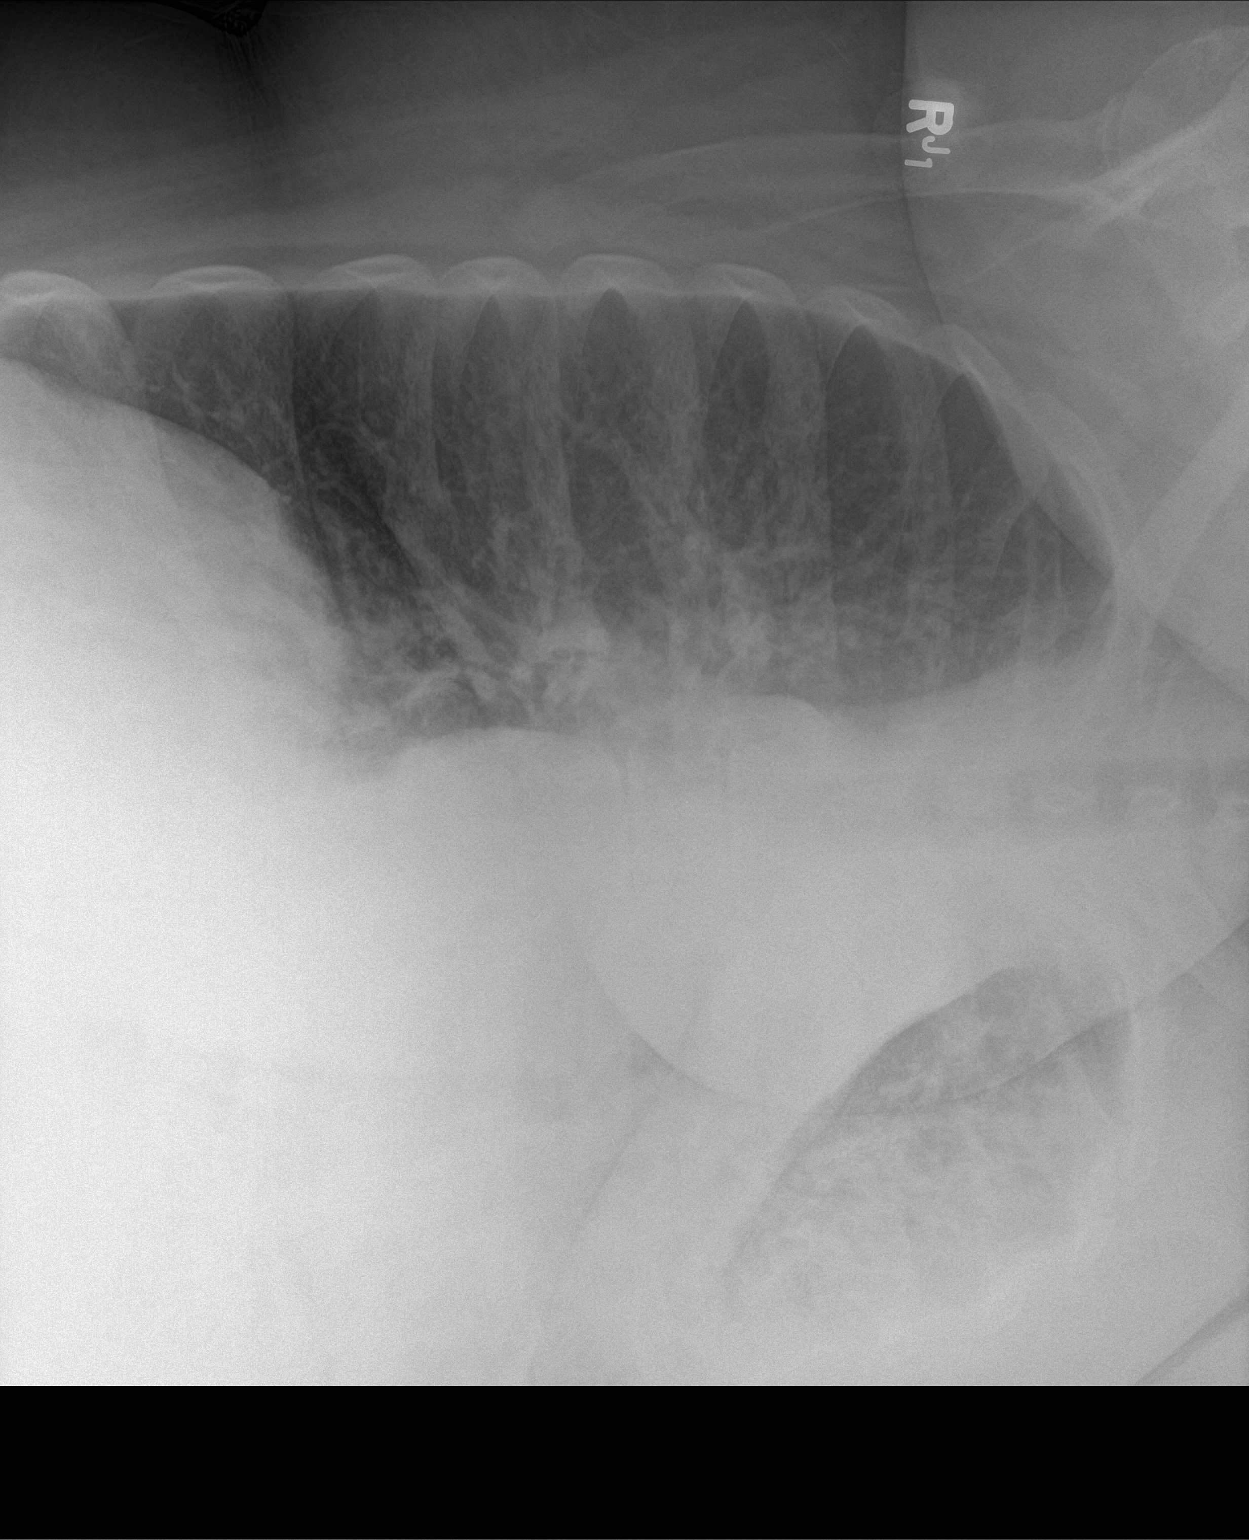

[1 of 1 positions shown; findings below may reference images not displayed]

FINDINGS: No significant layering left pleural effusion noted on decubitus
view. Continued airspace disease throughout the left lung.
IMPRESSION: No visible significant layering left pleural effusion.

## 2018-06-11 ENCOUNTER — Other Ambulatory Visit (HOSPITAL_COMMUNITY): Payer: Self-pay

## 2018-06-11 MED ORDER — ESCITALOPRAM OXALATE 20 MG PO TABS: 40.0000 mg | ORAL_TABLET | Freq: Every day | ORAL | 0 refills | Status: DC

## 2018-06-11 MED ORDER — SAPHRIS 2.5 MG SL SUBL: 2.5000 mg | SUBLINGUAL_TABLET | Freq: Three times a day (TID) | SUBLINGUAL | 0 refills | Status: DC

## 2018-06-11 MED ORDER — TRAZODONE HCL 100 MG PO TABS: 200.0000 mg | ORAL_TABLET | Freq: Every day | ORAL | 0 refills | Status: DC

## 2018-06-11 MED ORDER — DIAZEPAM 10 MG PO TABS: 10.0000 mg | ORAL_TABLET | Freq: Three times a day (TID) | ORAL | 0 refills | Status: DC

## 2018-06-13 ENCOUNTER — Ambulatory Visit (INDEPENDENT_AMBULATORY_CARE_PROVIDER_SITE_OTHER): Payer: Medicare HMO | Admitting: Psychiatry

## 2018-06-13 ENCOUNTER — Other Ambulatory Visit: Payer: Self-pay

## 2018-06-13 ENCOUNTER — Encounter (HOSPITAL_COMMUNITY): Payer: Self-pay | Admitting: Psychiatry

## 2018-06-13 DIAGNOSIS — F4312 Post-traumatic stress disorder, chronic: Secondary | ICD-10-CM | POA: Diagnosis not present

## 2018-06-13 DIAGNOSIS — F250 Schizoaffective disorder, bipolar type: Secondary | ICD-10-CM
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
## 2018-06-14 DIAGNOSIS — J45909 Unspecified asthma, uncomplicated: Secondary | ICD-10-CM | POA: Diagnosis not present

## 2018-06-14 DIAGNOSIS — M6281 Muscle weakness (generalized): Secondary | ICD-10-CM | POA: Diagnosis not present

## 2018-06-14 DIAGNOSIS — R062 Wheezing: Secondary | ICD-10-CM | POA: Diagnosis not present

## 2018-06-14 DIAGNOSIS — J449 Chronic obstructive pulmonary disease, unspecified: Secondary | ICD-10-CM | POA: Diagnosis not present

## 2018-06-15 DIAGNOSIS — J45909 Unspecified asthma, uncomplicated: Secondary | ICD-10-CM | POA: Diagnosis not present

## 2018-06-15 DIAGNOSIS — J449 Chronic obstructive pulmonary disease, unspecified: Secondary | ICD-10-CM | POA: Diagnosis not present

## 2018-06-15 DIAGNOSIS — R062 Wheezing: Secondary | ICD-10-CM | POA: Diagnosis not present

## 2018-06-15 DIAGNOSIS — M6281 Muscle weakness (generalized): Secondary | ICD-10-CM | POA: Diagnosis not present

## 2018-06-18 ENCOUNTER — Encounter (HOSPITAL_COMMUNITY): Payer: Self-pay | Admitting: Psychiatry

## 2018-06-18 ENCOUNTER — Ambulatory Visit (INDEPENDENT_AMBULATORY_CARE_PROVIDER_SITE_OTHER): Payer: Medicare HMO | Admitting: Psychiatry

## 2018-06-18 ENCOUNTER — Other Ambulatory Visit: Payer: Self-pay

## 2018-06-18 DIAGNOSIS — F4312 Post-traumatic stress disorder, chronic: Secondary | ICD-10-CM | POA: Diagnosis not present

## 2018-06-18 DIAGNOSIS — F250 Schizoaffective disorder, bipolar type: Secondary | ICD-10-CM
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
## 2018-06-22 DIAGNOSIS — J45909 Unspecified asthma, uncomplicated: Secondary | ICD-10-CM | POA: Diagnosis not present

## 2018-06-22 DIAGNOSIS — R062 Wheezing: Secondary | ICD-10-CM | POA: Diagnosis not present

## 2018-06-22 DIAGNOSIS — M6281 Muscle weakness (generalized): Secondary | ICD-10-CM | POA: Diagnosis not present

## 2018-06-22 DIAGNOSIS — J449 Chronic obstructive pulmonary disease, unspecified: Secondary | ICD-10-CM | POA: Diagnosis not present

## 2018-06-24 ENCOUNTER — Telehealth: Payer: Self-pay | Admitting: Emergency Medicine

## 2018-06-24 DIAGNOSIS — J449 Chronic obstructive pulmonary disease, unspecified: Secondary | ICD-10-CM | POA: Diagnosis not present

## 2018-06-24 NOTE — Telephone Encounter (Signed)
Attempted to return call to patient but no answer.  Left voicemail.  Will route to Dr. Lamonte Sakai for review.   Dr. Lamonte Sakai do you have recommendation for pulmonologist in [facility] for this patient?  She has moved.   Thank you

## 2018-06-25 DIAGNOSIS — F1420 Cocaine dependence, uncomplicated: Secondary | ICD-10-CM | POA: Diagnosis not present

## 2018-06-25 DIAGNOSIS — J45909 Unspecified asthma, uncomplicated: Secondary | ICD-10-CM | POA: Diagnosis not present

## 2018-06-25 DIAGNOSIS — F439 Reaction to severe stress, unspecified: Secondary | ICD-10-CM | POA: Diagnosis not present

## 2018-06-25 DIAGNOSIS — R062 Wheezing: Secondary | ICD-10-CM | POA: Diagnosis not present

## 2018-06-25 DIAGNOSIS — F329 Major depressive disorder, single episode, unspecified: Secondary | ICD-10-CM | POA: Diagnosis not present

## 2018-06-25 DIAGNOSIS — G4733 Obstructive sleep apnea (adult) (pediatric): Secondary | ICD-10-CM | POA: Diagnosis not present

## 2018-06-27 ENCOUNTER — Telehealth: Payer: Self-pay | Admitting: Emergency Medicine

## 2018-06-27 DIAGNOSIS — J42 Unspecified chronic bronchitis: Secondary | ICD-10-CM

## 2018-06-27 MED ORDER — TRELEGY ELLIPTA 100-62.5-25 MCG/INH IN AEPB: 1.0000 | INHALATION_SPRAY | Freq: Every day | RESPIRATORY_TRACT | 3 refills | Status: DC

## 2018-06-27 NOTE — Telephone Encounter (Signed)
Spoke with the pt  She states that she is needing refill on her trelegy  Rx sent

## 2018-06-28 ENCOUNTER — Ambulatory Visit (HOSPITAL_COMMUNITY): Payer: Medicare HMO | Admitting: Psychiatry

## 2018-06-28 NOTE — Telephone Encounter (Signed)
I spoke with Dr Valeta Harms, who knows the area, and he recommended either [facility] or [facility], both at [facility].

## 2018-06-28 NOTE — Telephone Encounter (Signed)
ATC pt, no answer. Left message for pt to call back.  

## 2018-06-30 DIAGNOSIS — J449 Chronic obstructive pulmonary disease, unspecified: Secondary | ICD-10-CM | POA: Diagnosis not present

## 2018-06-30 DIAGNOSIS — J9611 Chronic respiratory failure with hypoxia: Secondary | ICD-10-CM | POA: Diagnosis not present

## 2018-06-30 DIAGNOSIS — J9612 Chronic respiratory failure with hypercapnia: Secondary | ICD-10-CM | POA: Diagnosis not present

## 2018-07-01 ENCOUNTER — Ambulatory Visit (INDEPENDENT_AMBULATORY_CARE_PROVIDER_SITE_OTHER): Payer: Medicare HMO | Admitting: Psychiatry

## 2018-07-01 ENCOUNTER — Other Ambulatory Visit: Payer: Self-pay

## 2018-07-01 DIAGNOSIS — F250 Schizoaffective disorder, bipolar type: Secondary | ICD-10-CM

## 2018-07-01 DIAGNOSIS — F5081 Binge eating disorder: Secondary | ICD-10-CM

## 2018-07-01 DIAGNOSIS — F4312 Post-traumatic stress disorder, chronic: Secondary | ICD-10-CM | POA: Diagnosis not present

## 2018-07-01 MED ORDER — ESCITALOPRAM OXALATE 20 MG PO TABS: 40.0000 mg | ORAL_TABLET | Freq: Every day | ORAL | 0 refills | Status: DC

## 2018-07-01 MED ORDER — SAPHRIS 2.5 MG SL SUBL: 2.5000 mg | SUBLINGUAL_TABLET | Freq: Three times a day (TID) | SUBLINGUAL | 0 refills | Status: DC

## 2018-07-01 MED ORDER — TRAZODONE HCL 100 MG PO TABS: 200.0000 mg | ORAL_TABLET | Freq: Every day | ORAL | 0 refills | Status: DC

## 2018-07-01 MED ORDER — DIAZEPAM 10 MG PO TABS: 10.0000 mg | ORAL_TABLET | Freq: Three times a day (TID) | ORAL | 0 refills | Status: DC

## 2018-07-01 NOTE — Telephone Encounter (Signed)
Spoke with patient. Was able to give her the names of the doctors as well as the phone number and address to the clinic. She verbalized understanding.   Nothing further needed at time of call.

## 2018-07-01 NOTE — Progress Notes (Signed)
BH MD/PA/NP OP Progress Note  07/01/2018 8:45 AM Tiffany [name]  MRN:  [id number] Interview was conducted by phone and I verified that I was speaking with the correct person using two identifiers. I discussed the limitations of evaluation and management by telemedicine and  the availability of in person appointments. Patient expressed understanding and agreed to proceed.  Chief Complaint: Some anxiety.  HPI: 58 yo divorced AAF with schizoaffective disorder bipolar type(vs paranoid schizophrenia), PTSD, remote hx ofcocaineaddiction (clean for 3 years)and newly diagnosed bing eating disorder.[name] a former patient of Dr. [name] whom she saw last time in February 2019. She then started to see a new psychiatrist Dr. Frederico Hamman at [facility]. He changed her Geodon (she had QTC prolongation on it) to Saphris, mirtazapine was discontinued whereas high dose (40 mg) escitalopram continued and added Vyvanse for binge eating disorder. [place]ape used to weigh over 300 lbs and since starting Vyvanse her binge eating is well controlled and she was able to lose a lot of weight). She continues to take diazepam 10 mg tid and trazodone for sleep (150 mg plus 50 mg in am "for nerves"). She reportedfeeling emotionally stable and wantingto continue on current psychotropic medications without any changes during her initial visit in March.Later she reported increased paranoia and requested medication adjustments to address that. She wanted another antipsychotic in addition to low dose of Saphris she is already on. Given her obesity many are not a good choice. She was on Geodon in the past and reports not tolerating it well.[name] alsodiabetic and has COPD, We added Vrylar 3 mg Then 4.5 mg) daily and continue Saphris 2.5 mg bid (was on tid). She now says she did not tolerate Vraylar (apparently developed skin infection and nail fungus) and stopped taking it. She instead resumed taking Saphris tid and feels more  emotionally stable again. [name] in weekly counseling with Lise Auer LCSW. Recently moved to [place] and reports feeling safe and happy there. She intends to look for a local mental health provider but for the time being we will be following her long-distance.   Visit Diagnosis:    ICD-10-CM   1. Schizoaffective disorder, bipolar type ([place])  F25.0   2. Chronic posttraumatic stress disorder  F43.12   3. Binge eating disorder  F50.81     Past Psychiatric History: Please see intake H&P.  Past Medical History:  Past Medical History:  Diagnosis Date  . Anxiety   . Arthritis    "all over" (04/10/2016)  . Asthma   . Cardiac arrest ([place]) 09/08/2016   PEA  . Carotid artery stenosis    1-39% bilateral by dopplers 11/2016  . Chronic bronchitis ([place])   . Chronic diastolic (congestive) heart failure ([place])   . Chronic kidney disease    "I see a kidney dr." (04/10/2016)  . Cocaine abuse ([place])   . Complication of anesthesia    decreased bp, decreased heart rate  . Depression   . Disorder of nervous system   . Emphysema   . GERD (gastroesophageal reflux disease)   . Heart attack ([place]) 1980s  . History of blood transfusion 1994   "couldn't stop bleeding from my period"  . Hyperlipidemia LDL goal <70   . Hypertension   . Incontinence   . Manic depression ([place])   . On home oxygen therapy    "6L; 24/7" (04/10/2016)  . OSA on CPAP    "wear mask sometimes" (04/10/2016)  . Paranoid ([place])    "sometimes;  I'm on RX for it" (04/10/2016)  . Pneumonia    "I've had it several times; haven't had it since 06/2015" (04/10/2016)  . Schizophrenia ([place])   . Seasonal allergies   . Seizures ([place])    "don't know what kind; last one was ~ 1 yr ago" (04/10/2016)  . Sinus trouble   . Stroke [facility]) 1980s   denies residual on 04/10/2016  . Type II diabetes mellitus ([place])     Past Surgical History:  Procedure Laterality Date  . CESAREAN SECTION  1997  . HERNIA REPAIR    . IR CHOLANGIOGRAM EXISTING TUBE   07/20/2016  . IR PERC CHOLECYSTOSTOMY  05/10/2016  . IR RADIOLOGIST EVAL & MGMT  06/08/2016  . IR RADIOLOGIST EVAL & MGMT  06/29/2016  . IR SINUS/FIST TUBE CHK-NON GI  07/12/2016  . RIGHT/LEFT HEART CATH AND CORONARY ANGIOGRAPHY N/A 06/19/2017   Procedure: RIGHT/LEFT HEART CATH AND CORONARY ANGIOGRAPHY;  Surgeon: Jolaine Artist, MD;  Location: [facility];  Service: Cardiovascular;  Laterality: N/A;  . TIBIA IM NAIL INSERTION Right 07/12/2016   Procedure: INTRAMEDULLARY (IM) NAIL RIGHT TIBIA;  Surgeon: Leandrew Koyanagi, MD;  Location: [facility];  Service: Orthopedics;  Laterality: Right;  . UMBILICAL HERNIA REPAIR  ~ 1963   "that's why I don't have a belly button"  . VAGINAL HYSTERECTOMY      Family Psychiatric History: Reviewed.  Family History:  Family History  Problem Relation Age of Onset  . Cancer Father        prostate  . Cancer Mother        lung  . Depression Mother   . Depression Sister   . Anxiety disorder Sister   . Schizophrenia Sister   . Bipolar disorder Sister   . Depression Sister   . Depression Brother   . Heart failure Other        cousin    Social History:  Social History   Socioeconomic History  . Marital status: Widowed    Spouse name: Not on file  . Number of children: 3  . Years of education: Not on file  . Highest education level: Not on file  Occupational History  . 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 Financial resource strain: Not on file  . Food insecurity    Worry: Sometimes true    Inability: Never true  . Transportation needs    Medical: No    Non-medical: No  Tobacco Use  . Smoking status: Former Smoker    Packs/day: 1.50    Years: 38.00    Pack years: 57.00    Types: Cigarettes    Start date: 03/13/1977    Quit date: 04/10/2016    Years since quitting: 2.2  . Smokeless tobacco: Never Used  Substance and Sexual Activity  . Alcohol use: No    Alcohol/week: 0.0 standard drinks  . Drug use: No     Types: Cocaine    Comment: 04/10/2016 "last used cocaine back in November 2017"  . Sexual activity: Not Currently    Birth control/protection: Surgical  Lifestyle  . Physical activity    Days per week: Not on file    Minutes per session: Not on file  . Stress: Not on file  Relationships  . Social Herbalist on phone: Not on file    Gets together: Not on file    Attends religious service: Not on file  Active member of club or organization: Not on file    Attends meetings of clubs or organizations: Not on file    Relationship status: Not on file  Other Topics Concern  . Not on file  Social History Narrative   Has 1 son, [name]   Lives with son and his boyfriend   Her house has ramps and handrails should she ever needs them.    Her mother lives down the street from her and is a good support person in addition to her son.   She drives herself, has private transportation.    Cocaine free since 02/24/16, smoke free since 04/10/16    Allergies:  Allergies  Allergen Reactions  . Hydrocodone-Acetaminophen Shortness Of Breath  . Hydroxyzine Anaphylaxis and Shortness Of Breath  . Latuda [Lurasidone Hcl] Anaphylaxis  . Magnesium-Containing Compounds Anaphylaxis  . Prednisone Anaphylaxis, Swelling and Other (See Comments)    Tongue swelling  . Tramadol Anaphylaxis  . Codeine Nausea And Vomiting  . Sulfa Antibiotics Itching  . Tape Rash    Metabolic Disorder Labs: Lab Results  Component Value Date   HGBA1C 4.7 (L) 04/05/2018   MPG 125.5 01/07/2017   MPG 111.15 11/03/2016   No results found for: PROLACTIN Lab Results  Component Value Date   CHOL 129 01/07/2017   TRIG 121 11/21/2017   HDL 35 (L) 01/07/2017   CHOLHDL 3.7 01/07/2017   VLDL 38 01/07/2017   LDLCALC 56 01/07/2017   LDLCALC 44 07/10/2016   Lab Results  Component Value Date   TSH 0.563 03/15/2016   TSH 1.060 12/04/2015    Therapeutic Level Labs: No results found for: LITHIUM Lab Results   Component Value Date   VALPROATE 20 (L) 09/09/2016   VALPROATE 46 (L) 07/10/2016   No components found for:  CBMZ  Current Medications: Current Outpatient Medications  Medication Sig Dispense Refill  . albuterol (PROVENTIL) (2.5 MG/3ML) 0.083% nebulizer solution Take 3 mLs (2.5 mg total) by nebulization every 6 (six) hours as needed for wheezing or shortness of breath. 150 mL 1  . albuterol (VENTOLIN HFA) 108 (90 Base) MCG/ACT inhaler Inhale 2 puffs into the lungs every 6 (six) hours as needed for wheezing or shortness of breath. 1 Inhaler 2  . amLODipine (NORVASC) 5 MG tablet Take 5 mg by mouth daily.    [name] aspirin (GOODSENSE ASPIRIN LOW DOSE) 81 MG EC tablet TAKE 1 TABLET (81 MG TOTAL) BY MOUTH DAILY (MORNING). (Patient taking differently: Take 81 mg by mouth daily. ) 90 tablet 3  . colchicine (COLCRYS) 0.6 MG tablet TAKE 1 TABLET (0.6 MG TOTAL) BY MOUTH 2 (TWO) TIMES DAILY. (Patient taking differently: Take 0.6 mg by mouth 2 (two) times daily. ) 180 tablet 3  . diazepam (VALIUM) 10 MG tablet Take 1 tablet (10 mg total) by mouth 3 (three) times daily. 270 tablet 0  . diclofenac sodium (VOLTAREN) 1 % GEL Apply 2 g topically 4 (four) times daily. (Patient taking differently: Apply 2 g topically 4 (four) times daily as needed (pain). ) 100 g 0  . escitalopram (LEXAPRO) 20 MG tablet Take 2 tablets (40 mg total) by mouth daily. 180 tablet 0  . febuxostat (ULORIC) 40 MG tablet Take 1 tablet by mouth daily.    . ferrous sulfate (FEROSUL) 325 (65 FE) MG tablet TAKE 1 TABLET BY MOUTH DAILY.(MORNING) (Patient taking differently: Take 325 mg by mouth daily with breakfast. ) 90 tablet 3  . flunisolide (NASALIDE) 25 MCG/ACT (0.025%) SOLN Place 2 sprays  into the nose daily. 1 Bottle 2  . Fluticasone-Umeclidin-Vilant (TRELEGY ELLIPTA) 100-62.5-25 MCG/INH AEPB Inhale 1 puff into the lungs daily. 60 each 3  . gabapentin (NEURONTIN) 600 MG tablet Take 1 tablet (600 mg total) by mouth 2 (two) times daily.  (Patient taking differently: Take 1,200 mg by mouth 3 (three) times daily. ) 360 tablet 0  . lisdexamfetamine (VYVANSE) 70 MG capsule Take 70 mg by mouth daily.    . metolazone (ZAROXOLYN) 2.5 MG tablet Take 1 tablet (2.5 mg total) by mouth daily. 90 tablet 0  . miconazole (MICONAZOLE 7) 2 % vaginal cream Place 1 Applicatorful vaginally at bedtime. 45 g 0  . montelukast (SINGULAIR) 10 MG tablet Take 1 tablet (10 mg total) by mouth at bedtime. 90 tablet 3  . Multiple Vitamin (MULTIVITAMIN WITH MINERALS) TABS tablet Take 1 tablet by mouth daily.    [name] omeprazole (PRILOSEC) 40 MG capsule Take 1 capsule (40 mg total) by mouth 2 (two) times daily. 180 capsule 3  . oxybutynin (DITROPAN-XL) 10 MG 24 hr tablet TAKE 1 TABLET BY MOUTH EVERYDAY AT BEDTIME (Patient taking differently: Take 10 mg by mouth at bedtime. ) 90 tablet 3  . potassium chloride (K-DUR) 10 MEQ tablet Take 6 tablets by mouth 2 (two) times a day.    [name] SAPHRIS 2.5 MG SUBL Place 1 tablet (2.5 mg total) under the tongue 3 (three) times daily. 270 tablet 0  . terconazole (TERAZOL 3) 0.8 % vaginal cream Apply pea sized amount to clitoris nightly. 20 g 0  . torsemide (DEMADEX) 20 MG tablet Take 4 tablets (80 mg total) by mouth 2 (two) times daily. 240 tablet 4  . traZODone (DESYREL) 100 MG tablet Take 2 tablets (200 mg total) by mouth at bedtime. 180 tablet 0  . VRAYLAR 4.5 MG CAPS Take 4.5 mg by mouth daily.    [name] zinc oxide 20 % ointment Apply 1 application topically as needed for irritation. 56.7 g 5   No current facility-administered medications for this visit.      Psychiatric Specialty Exam: Review of Systems  Respiratory: Positive for shortness of breath.   Psychiatric/Behavioral: The patient is nervous/anxious.   All other systems reviewed and are negative.   There were no vitals taken for this visit.There is no height or weight on file to calculate BMI.  General Appearance: NA  Eye Contact:  NA  Speech:  Slow and at times  difficult to understand (she has a speech impediment).  Volume:  Normal  Mood:  Anxious  Affect:  NA  Thought Process:  Goal Directed  Orientation:  Full (Time, Place, and Person)  Thought Content: Logical   Suicidal Thoughts:  No  Homicidal Thoughts:  No  Memory:  Immediate;   Fair Recent;   Fair Remote;   Fair  Judgement:  Fair  Insight:  Fair  Psychomotor Activity:  NA  Concentration:  Concentration: Fair  Recall:  [employer] of Knowledge: Fair  Language: Fair  Akathisia:  NA  Handed:  Right  AIMS (if indicated): not done  Assets:  Desire for Improvement Housing Resilience  ADL's:  Intact  Cognition: WNL  Sleep:  Good   Screenings: AUDIT     Admission (Discharged) from 12/01/2015 in [facility]  Alcohol Use Disorder Identification Test Final Score (AUDIT)  0    GAD-7     Office Visit from 03/21/2018 in [facility] for [facility]  Total GAD-7 Score  4  PHQ2-9     Office Visit from 03/21/2018 in [facility] for [facility] Office Visit from 02/07/2018 in [facility] Office Visit from 12/31/2017 in [facility] Office Visit from 11/27/2017 in [facility] Office Visit from 11/12/2017 in [facility]  PHQ-2 Total Score  2  0  0  0  0  PHQ-9 Total Score  2  -  -  -  -       Assessment and Plan: 58 yo divorced AAF with schizoaffective disorder bipolar type(vs paranoid schizophrenia), PTSD, remote hx ofcocaineaddiction (clean for 3 years)and newly diagnosed bing eating disorder.[name] a former patient of Dr. [name] whom she saw last time in February 2019. She then started to see a new psychiatrist Dr. Frederico Hamman at [facility]. He changed her Geodon (she had QTC prolongation on it) to Saphris, mirtazapine was discontinued whereas high dose (40 mg) escitalopram continued and added Vyvanse for binge eating disorder. [place] used to weigh  over 300 lbs and since starting Vyvanse her binge eating is well controlled and she was able to lose a lot of weight). She continues to take diazepam 10 mg tid and trazodone for sleep (150 mg plus 50 mg in am "for nerves"). She reportedfeeling emotionally stable and wantingto continue on current psychotropic medications without any changes during her initial visit in March.Later she reported increased paranoia and requested medication adjustments to address that. She initially wanted another antipsychotic in addition to low dose of Saphris she is already on. Given her obesity many are not a good choice. She was on Geodon in the past and reports not tolerating it well.[name] alsodiabetic and has COPD, We added Vrylar 3 mg Then 4.5 mg) daily and continue Saphris 2.5 mg bid (was on tid). She did not tolerate Vraylar (apparently developed skin infection and nail fungus) and stopped taking it. She instead resumed taking Saphris tid and feels more emotionally stable again. She continues in weekly counseling with Lise Auer LCSW. Recently moved to [place] and reports feeling safe and happy there. She intends to look for a local mental health provider but for the time being we will be folowing her long-distance.   Plan: Continue current meds unchanged: Lexapro, trazodone, Saphris, diazepam and Vyvanse. Next appointment in 3 months or prn (she may find a new provider closer to where she now lives.   Stephanie Acre, MD 07/01/2018, 8:45 AM

## 2018-07-02 ENCOUNTER — Ambulatory Visit (HOSPITAL_COMMUNITY): Payer: Medicare HMO | Admitting: Psychiatry

## 2018-07-05 ENCOUNTER — Ambulatory Visit (INDEPENDENT_AMBULATORY_CARE_PROVIDER_SITE_OTHER): Payer: Medicare HMO | Admitting: Psychiatry

## 2018-07-05 ENCOUNTER — Encounter (HOSPITAL_COMMUNITY): Payer: Self-pay | Admitting: Psychiatry

## 2018-07-05 ENCOUNTER — Other Ambulatory Visit: Payer: Self-pay

## 2018-07-05 DIAGNOSIS — F4312 Post-traumatic stress disorder, chronic: Secondary | ICD-10-CM

## 2018-07-05 DIAGNOSIS — F250 Schizoaffective disorder, bipolar type: Secondary | ICD-10-CM

## 2018-07-05 NOTE — Progress Notes (Signed)
Virtual Visit via Telephone Note  I connected with Tiffany [name] on 07/05/18 at 11:00 AM EDT by telephone and verified that I am speaking with the correct person using two identifiers.  Location: Patient: Tiffany [name] Provider: Lise Auer, LCSW   I discussed the limitations, risks, security and privacy concerns of performing an evaluation and management service by telephone and the availability of in person appointments. I also discussed with the patient that there may be a patient responsible charge related to this service. The patient expressed understanding and agreed to proceed.   History of Present Illness: Schizoaffective Disorder, Bipolar type and Chronic PTSD.    Observations/Objective: Counselor met with Tiffany [name] for individual therapy via Webex. Counselor assessed MH symptoms and progress on treatment plan goals. Tiffany [name] denied suicidal ideation or self-harm behaviors. Tiffany [name] shared that she is experiencing increased stressors, as her transportation, housing, support system and legal issues are all "in rocky territory". Counselor assessed these areas of functioning with Tiffany [name]. Counselor processed things in her control and out of her control. Counselor and Tiffany [name] identified various ways to address the issues and discussed resources that would be good aids. [place]ia reported being overwhelmed and like the treatment program she is in it not reliable or meeting her needs. Counselor discussed communication skills in advocating for her needs to be met. Counselor and Tiffany [name] ended a few minutes early because Tiffany [name] had other obligations.    Assessment and Plan: Counselor will continue to meet with Tiffany [name] to address treatment plan goals. Tiffany [name] will continue to follow recommendations of providers and implement skills learned in session.  Follow Up Instructions: Counselor will send information for next session via Webex.   I provided 41 minutes of non-face-to-face time during this encounter.   Lise Auer, LCSW

## 2018-07-09 ENCOUNTER — Ambulatory Visit (HOSPITAL_COMMUNITY): Payer: Medicare HMO | Admitting: Psychiatry

## 2018-07-09 ENCOUNTER — Telehealth (HOSPITAL_COMMUNITY): Payer: Self-pay

## 2018-07-09 ENCOUNTER — Other Ambulatory Visit (HOSPITAL_COMMUNITY): Payer: Self-pay | Admitting: Psychiatry

## 2018-07-09 MED ORDER — LISDEXAMFETAMINE DIMESYLATE 70 MG PO CAPS: 70.0000 mg | ORAL_CAPSULE | Freq: Every day | ORAL | 0 refills | Status: DC

## 2018-07-09 NOTE — Telephone Encounter (Signed)
Patient called and stated that the pharmacy told her they need a new script for her Vyvanse 70mg . [facility] [place], [place]. Please review and advise. Thank you.

## 2018-07-09 NOTE — Telephone Encounter (Signed)
Done

## 2018-07-10 DIAGNOSIS — M79605 Pain in left leg: Secondary | ICD-10-CM | POA: Diagnosis not present

## 2018-07-10 DIAGNOSIS — M79645 Pain in left finger(s): Secondary | ICD-10-CM | POA: Diagnosis not present

## 2018-07-10 DIAGNOSIS — M79604 Pain in right leg: Secondary | ICD-10-CM | POA: Diagnosis not present

## 2018-07-10 DIAGNOSIS — R600 Localized edema: Secondary | ICD-10-CM | POA: Diagnosis not present

## 2018-07-11 IMAGING — CR DG CHEST 2V
2 series · 2 of 2 positions shown · non-contrast
Comparison: None.

CLINICAL DATA: Shortness of breath, cough for 1 month

EXAM:
CHEST  2 VIEW

[chest pa]
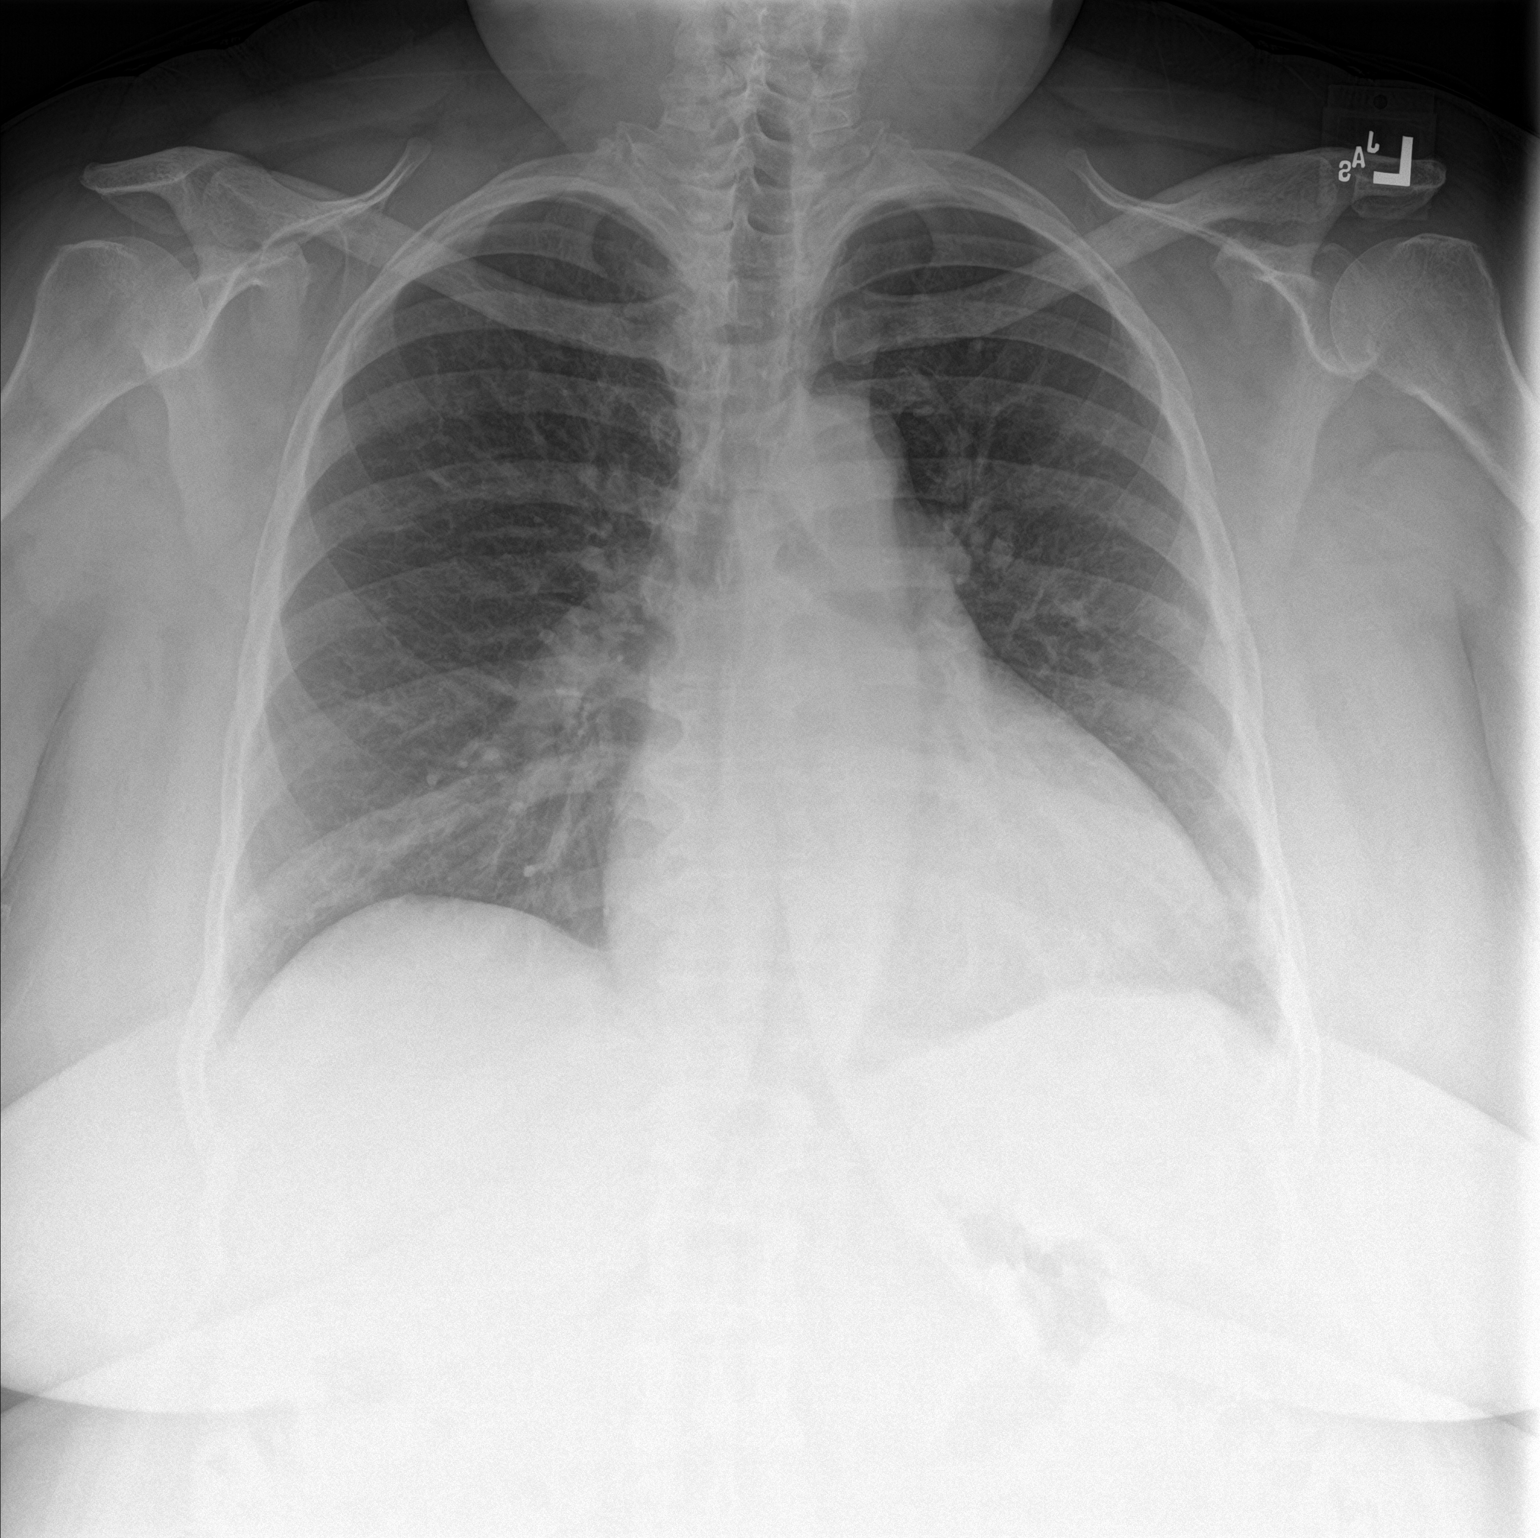

[chest lat]
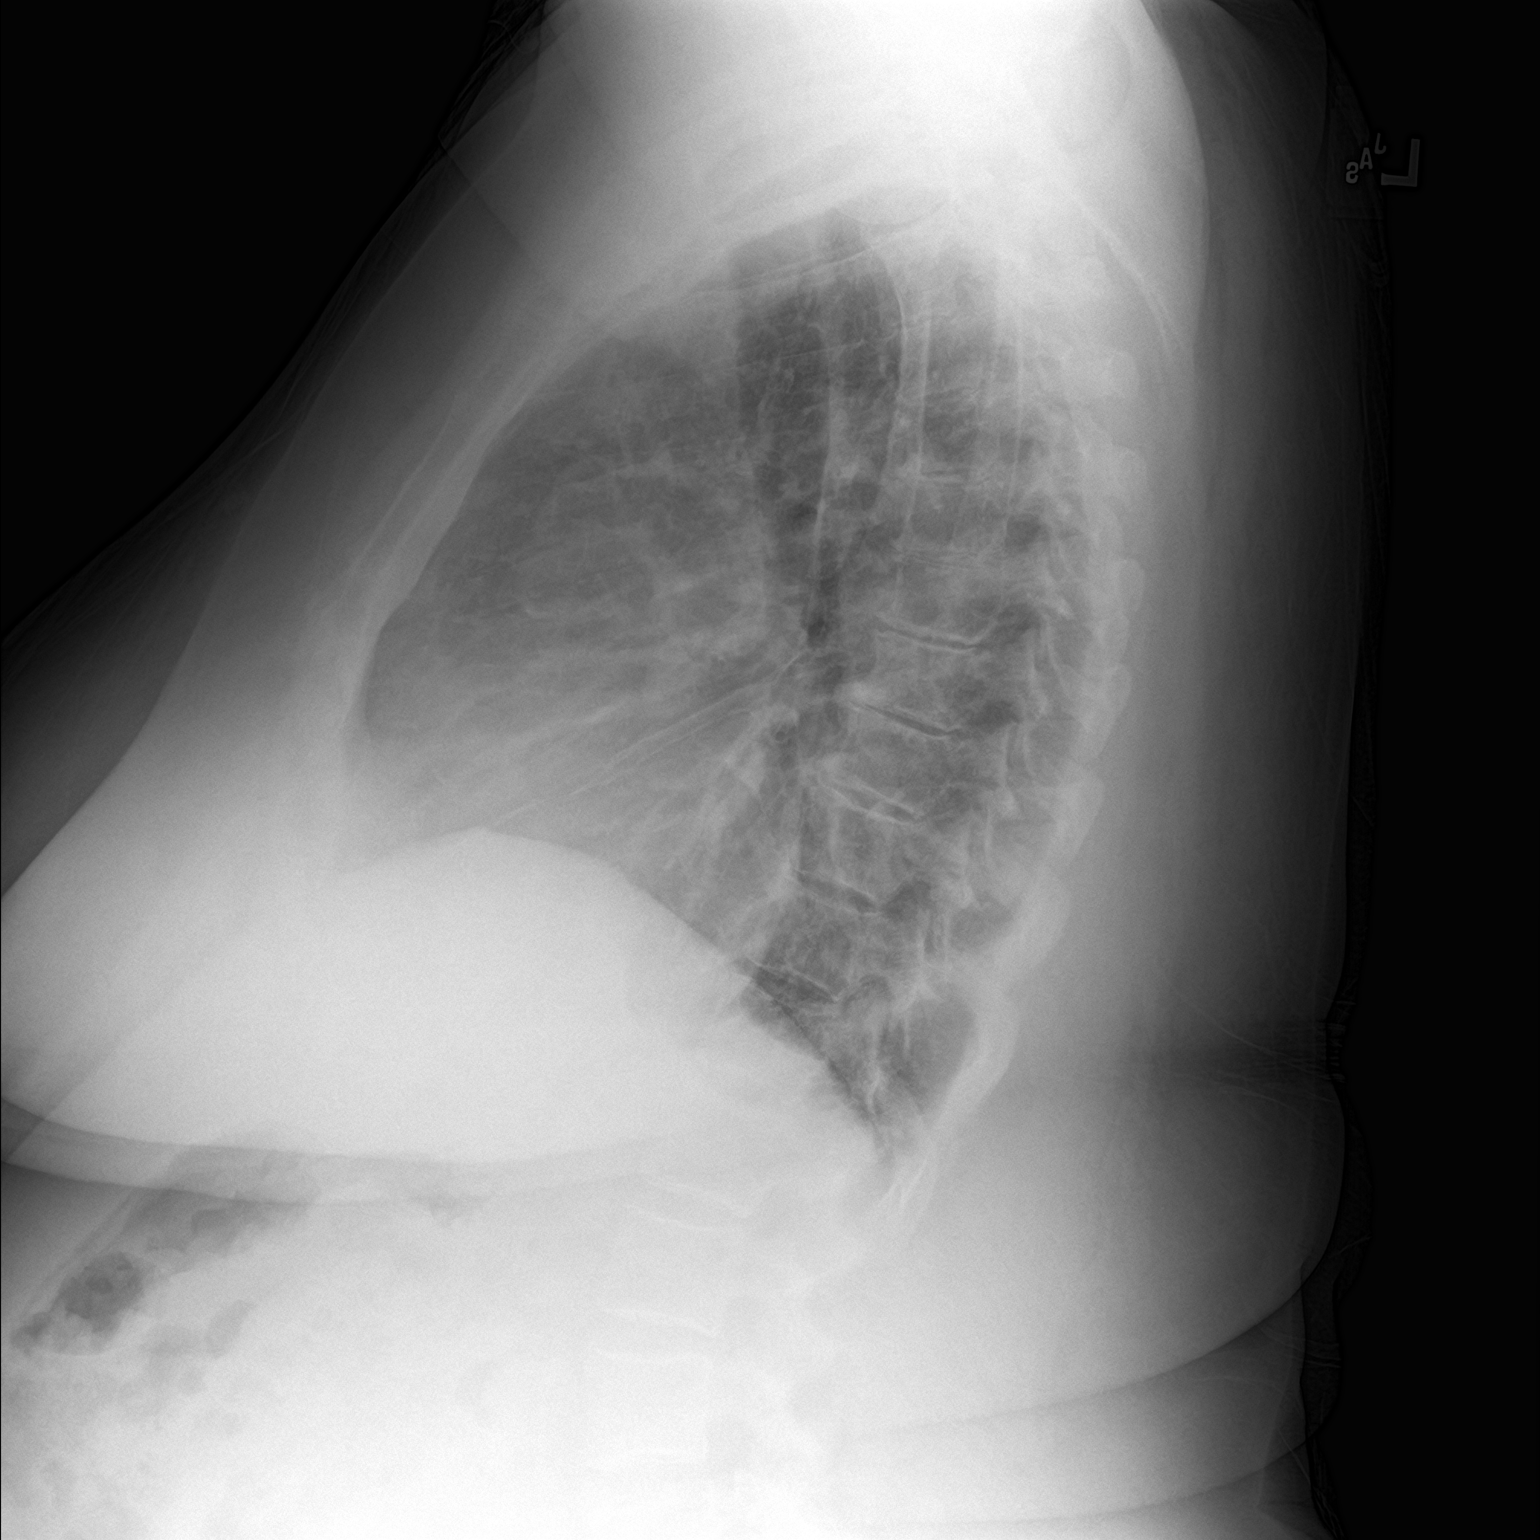

[2 of 2 positions shown; findings below may reference images not displayed]

FINDINGS: Cardiomediastinal silhouette is stable. Probable chronic mild
interstitial prominence bilateral without focal consolidation. Mild
perihilar bronchitic changes. Mild degenerative changes mid and
lower thoracic spine. No pulmonary edema.
IMPRESSION: Probable chronic mild interstitial prominence bilateral without
focal consolidation or pulmonary edema. Mild perihilar bronchitic
changes.

## 2018-07-15 DIAGNOSIS — J45909 Unspecified asthma, uncomplicated: Secondary | ICD-10-CM | POA: Diagnosis not present

## 2018-07-15 DIAGNOSIS — R062 Wheezing: Secondary | ICD-10-CM | POA: Diagnosis not present

## 2018-07-15 DIAGNOSIS — M6281 Muscle weakness (generalized): Secondary | ICD-10-CM | POA: Diagnosis not present

## 2018-07-15 DIAGNOSIS — J449 Chronic obstructive pulmonary disease, unspecified: Secondary | ICD-10-CM | POA: Diagnosis not present

## 2018-07-16 DIAGNOSIS — M6281 Muscle weakness (generalized): Secondary | ICD-10-CM | POA: Diagnosis not present

## 2018-07-16 DIAGNOSIS — R062 Wheezing: Secondary | ICD-10-CM | POA: Diagnosis not present

## 2018-07-16 DIAGNOSIS — J45909 Unspecified asthma, uncomplicated: Secondary | ICD-10-CM | POA: Diagnosis not present

## 2018-07-16 DIAGNOSIS — J449 Chronic obstructive pulmonary disease, unspecified: Secondary | ICD-10-CM | POA: Diagnosis not present

## 2018-07-22 DIAGNOSIS — M6281 Muscle weakness (generalized): Secondary | ICD-10-CM | POA: Diagnosis not present

## 2018-07-22 DIAGNOSIS — J449 Chronic obstructive pulmonary disease, unspecified: Secondary | ICD-10-CM | POA: Diagnosis not present

## 2018-07-22 DIAGNOSIS — J45909 Unspecified asthma, uncomplicated: Secondary | ICD-10-CM | POA: Diagnosis not present

## 2018-07-22 DIAGNOSIS — R062 Wheezing: Secondary | ICD-10-CM | POA: Diagnosis not present

## 2018-07-24 DIAGNOSIS — J449 Chronic obstructive pulmonary disease, unspecified: Secondary | ICD-10-CM | POA: Diagnosis not present

## 2018-07-26 ENCOUNTER — Other Ambulatory Visit: Payer: Self-pay

## 2018-07-26 ENCOUNTER — Ambulatory Visit (INDEPENDENT_AMBULATORY_CARE_PROVIDER_SITE_OTHER): Payer: Medicare HMO | Admitting: Psychiatry

## 2018-07-26 ENCOUNTER — Encounter (HOSPITAL_COMMUNITY): Payer: Self-pay | Admitting: Psychiatry

## 2018-07-26 DIAGNOSIS — F250 Schizoaffective disorder, bipolar type: Secondary | ICD-10-CM

## 2018-07-26 DIAGNOSIS — F4312 Post-traumatic stress disorder, chronic: Secondary | ICD-10-CM | POA: Diagnosis not present
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
## 2018-07-30 DIAGNOSIS — F203 Undifferentiated schizophrenia: Secondary | ICD-10-CM | POA: Diagnosis not present

## 2018-07-30 DIAGNOSIS — J449 Chronic obstructive pulmonary disease, unspecified: Secondary | ICD-10-CM | POA: Diagnosis not present

## 2018-07-30 DIAGNOSIS — J9612 Chronic respiratory failure with hypercapnia: Secondary | ICD-10-CM | POA: Diagnosis not present

## 2018-07-30 DIAGNOSIS — D508 Other iron deficiency anemias: Secondary | ICD-10-CM | POA: Diagnosis not present

## 2018-07-30 DIAGNOSIS — M109 Gout, unspecified: Secondary | ICD-10-CM | POA: Diagnosis not present

## 2018-07-30 DIAGNOSIS — M2550 Pain in unspecified joint: Secondary | ICD-10-CM | POA: Diagnosis not present

## 2018-07-30 DIAGNOSIS — E1122 Type 2 diabetes mellitus with diabetic chronic kidney disease: Secondary | ICD-10-CM | POA: Diagnosis not present

## 2018-07-30 DIAGNOSIS — M150 Primary generalized (osteo)arthritis: Secondary | ICD-10-CM | POA: Diagnosis not present

## 2018-07-30 DIAGNOSIS — N184 Chronic kidney disease, stage 4 (severe): Secondary | ICD-10-CM | POA: Diagnosis not present

## 2018-07-30 DIAGNOSIS — J9611 Chronic respiratory failure with hypoxia: Secondary | ICD-10-CM | POA: Diagnosis not present

## 2018-07-30 DIAGNOSIS — I509 Heart failure, unspecified: Secondary | ICD-10-CM | POA: Diagnosis not present

## 2018-07-31 ENCOUNTER — Ambulatory Visit: Payer: Medicare HMO | Admitting: Registered Nurse

## 2018-08-01 IMAGING — CR DG CHEST 1V PORT
1 series · 1 of 1 positions shown · non-contrast
Comparison: 07/29/2015

CLINICAL DATA: Acute respiratory distress. Decreased oxygen
saturation.

EXAM:
PORTABLE CHEST 1 VIEW

[AP]
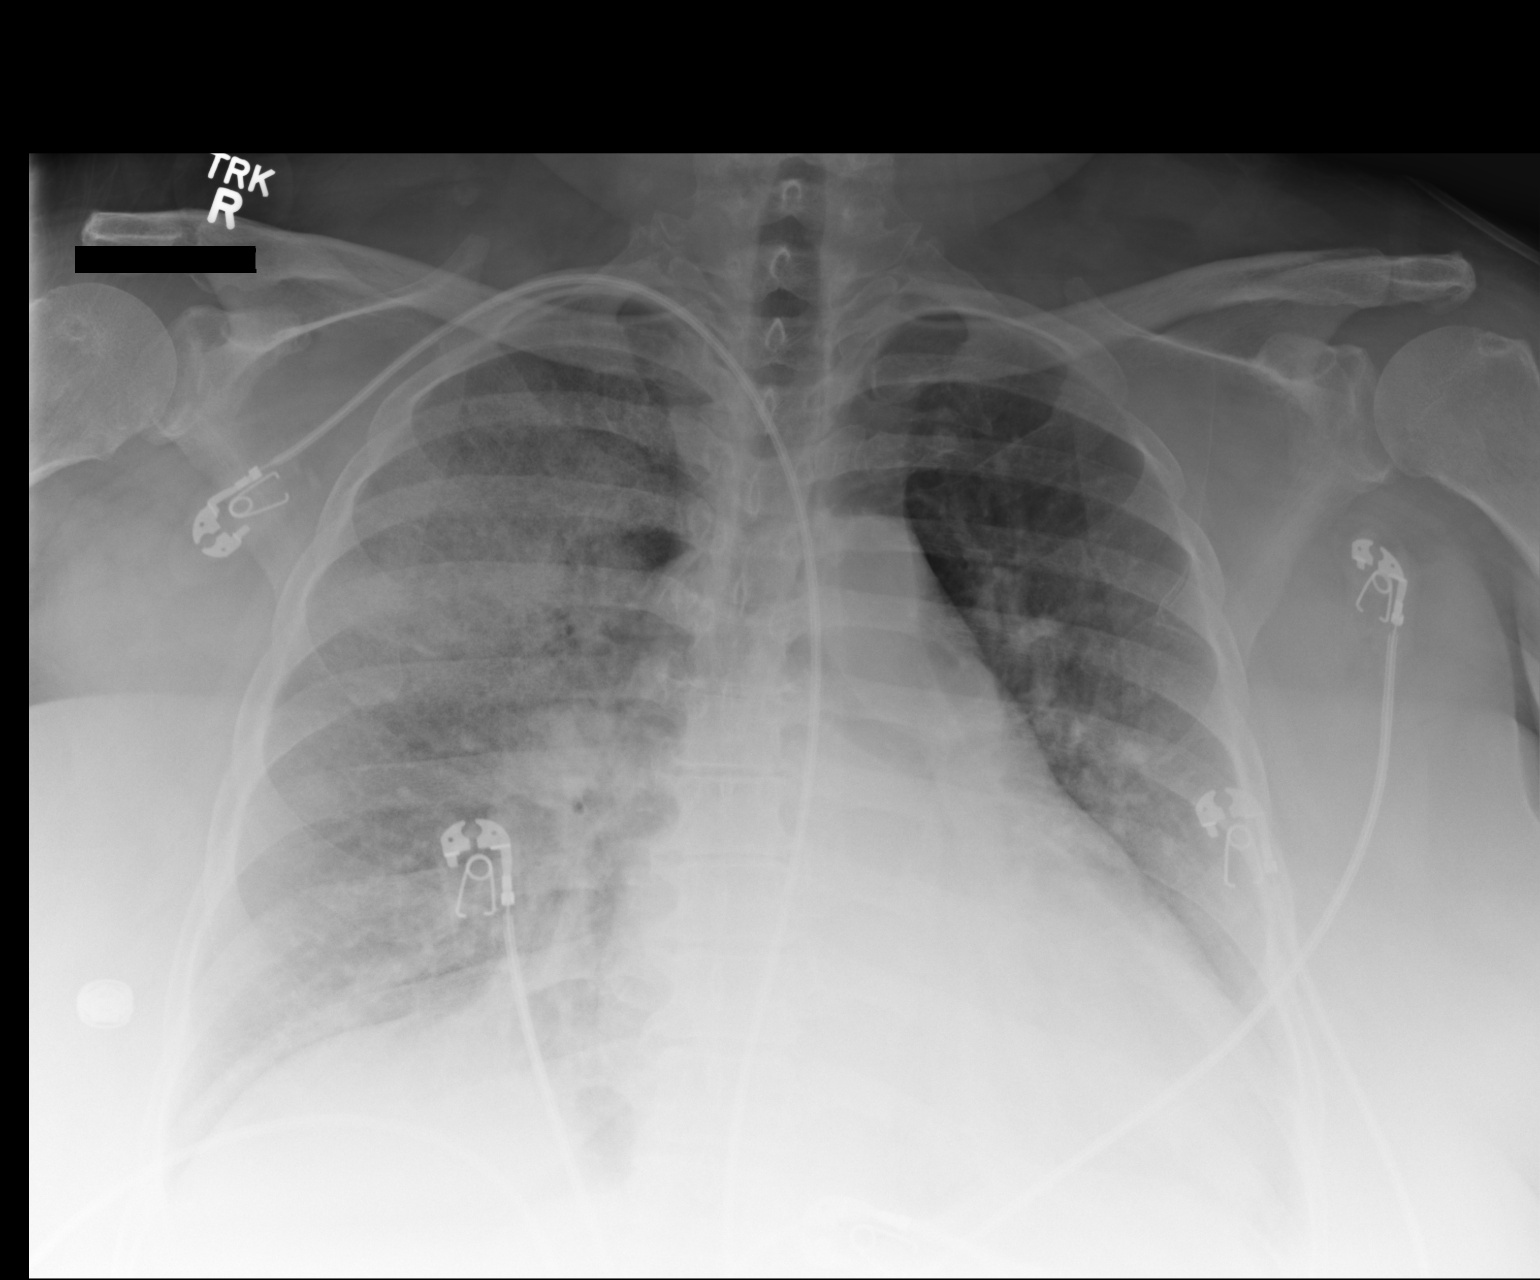

[1 of 1 positions shown; findings below may reference images not displayed]

FINDINGS: The heart is enlarged but stable. Stable tortuosity and ectasia of
the thoracic aorta. Asymmetric airspace process in the right lung
could be asymmetric pulmonary edema or pneumonia/ aspiration. No
definite pleural effusions. The bony thorax is intact.
IMPRESSION: Stable cardiac enlargement.

Asymmetric airspace process in the right lung could reflect
asymmetric pulmonary edema or pneumonia/aspiration.

## 2018-08-02 ENCOUNTER — Other Ambulatory Visit: Payer: Self-pay | Admitting: Family Medicine

## 2018-08-02 DIAGNOSIS — N183 Chronic kidney disease, stage 3 (moderate): Secondary | ICD-10-CM | POA: Diagnosis not present

## 2018-08-02 DIAGNOSIS — R609 Edema, unspecified: Secondary | ICD-10-CM | POA: Diagnosis not present

## 2018-08-02 DIAGNOSIS — E1122 Type 2 diabetes mellitus with diabetic chronic kidney disease: Secondary | ICD-10-CM | POA: Diagnosis not present

## 2018-08-02 DIAGNOSIS — I129 Hypertensive chronic kidney disease with stage 1 through stage 4 chronic kidney disease, or unspecified chronic kidney disease: Secondary | ICD-10-CM | POA: Diagnosis not present

## 2018-08-02 DIAGNOSIS — D631 Anemia in chronic kidney disease: Secondary | ICD-10-CM | POA: Diagnosis not present

## 2018-08-02 DIAGNOSIS — R350 Frequency of micturition: Secondary | ICD-10-CM | POA: Diagnosis not present

## 2018-08-02 DIAGNOSIS — M109 Gout, unspecified: Secondary | ICD-10-CM | POA: Diagnosis not present

## 2018-08-04 IMAGING — CT CT CHEST W/ CM
2 of 3 series · 15 of 36 positions shown, 18 images · IV contrast (APPLIED)
Comparison: 08/19/2015

CLINICAL DATA: Shortness of breath and bloody sputum

EXAM:
CT CHEST WITH CONTRAST
TECHNIQUE: Multidetector CT imaging of the chest was performed during
intravenous contrast administration.
CONTRAST:  75mL 34S96O-DMM IOPAMIDOL (34S96O-DMM) INJECTION 61%

[Series 3: thorax · axial · 0.64mm/px · z∈[-317,-65]mm · 12 of 150 slices shown, 15 images]
[im 12/150  mediastinal]
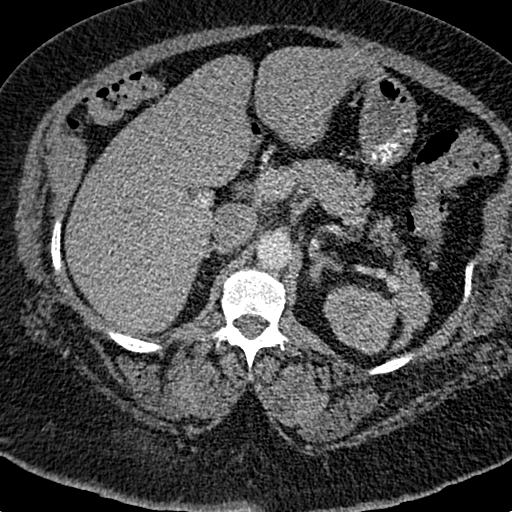
[im 12/150  lung]
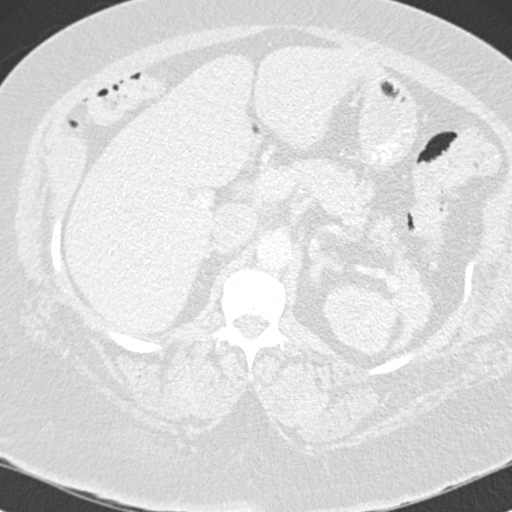
[im 23/150  lung]
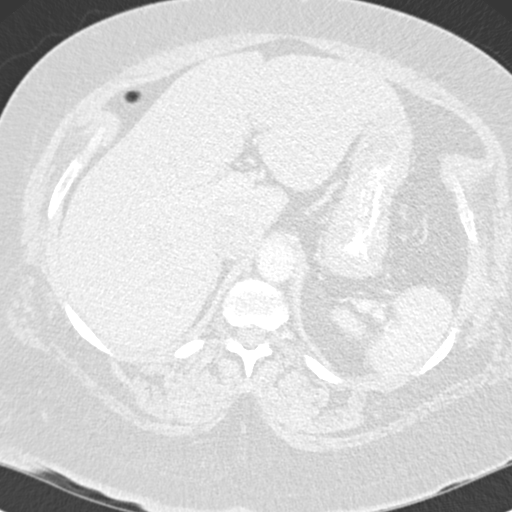
[im 34/150  lung]
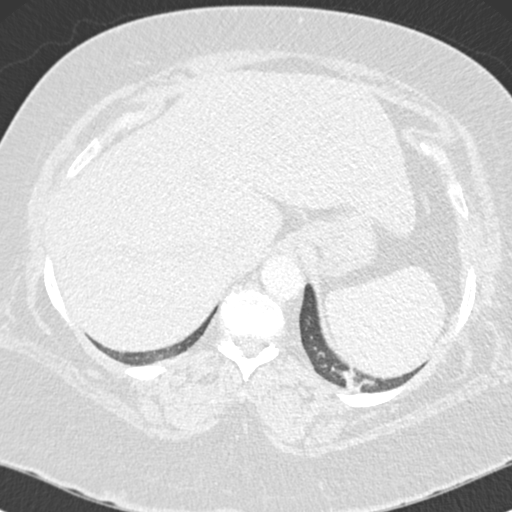
[im 45/150  lung]
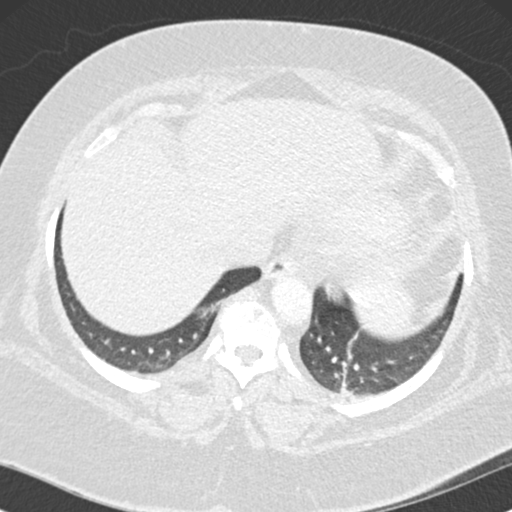
[im 56/150  mediastinal]
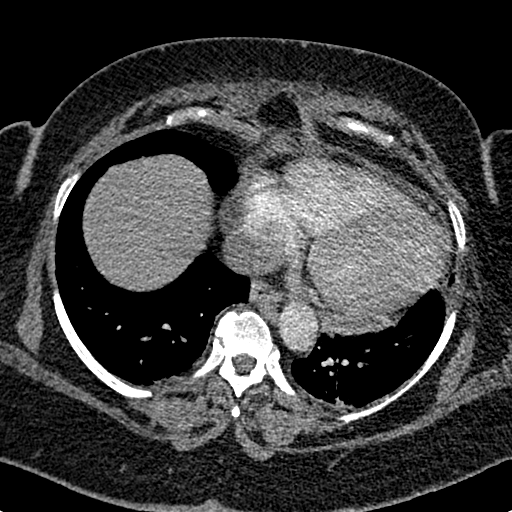
[im 56/150  lung]
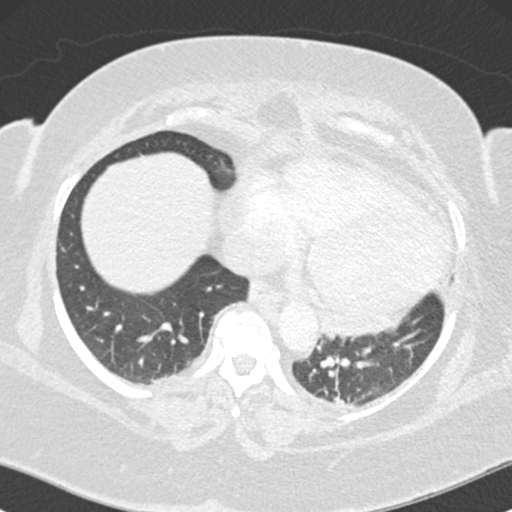
[im 67/150  lung]
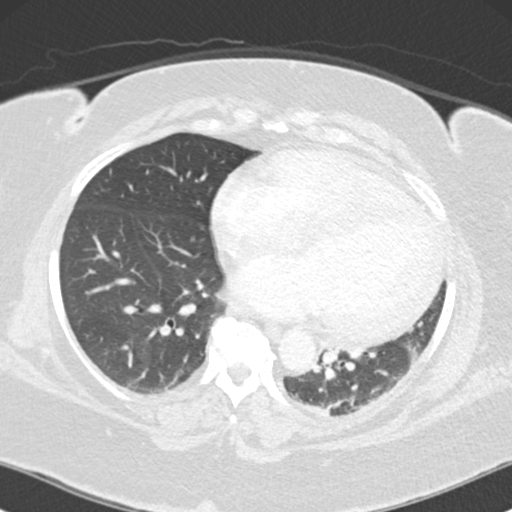
[im 83/150  lung]
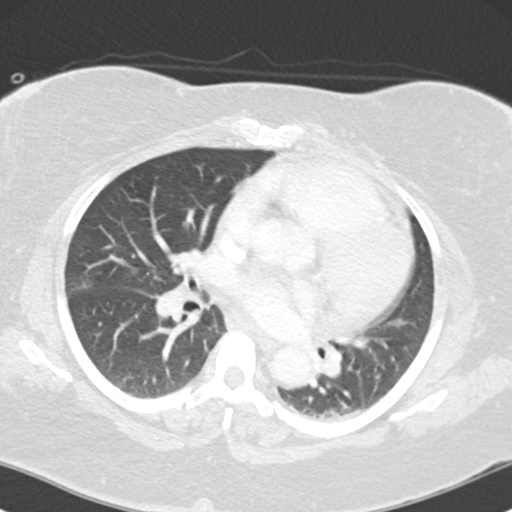
[im 94/150  lung]
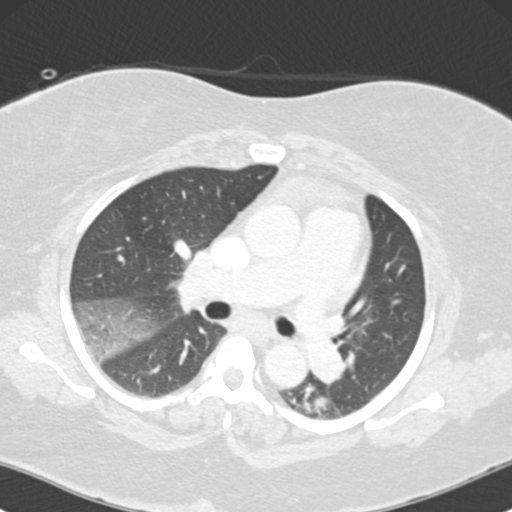
[im 105/150  mediastinal]
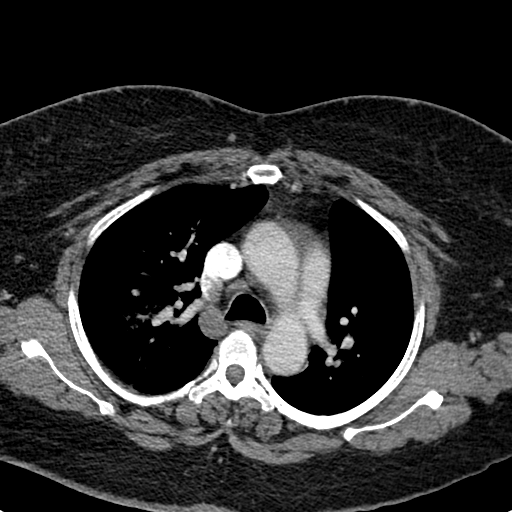
[im 105/150  lung]
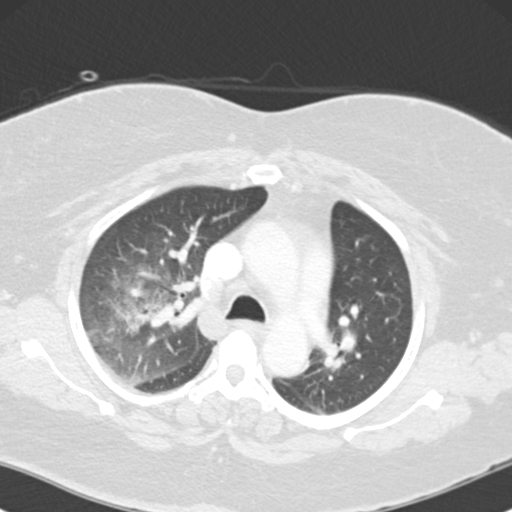
[im 116/150  lung]
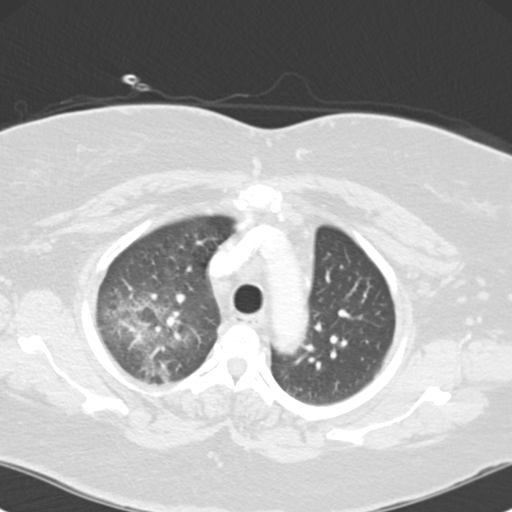
[im 127/150  lung]
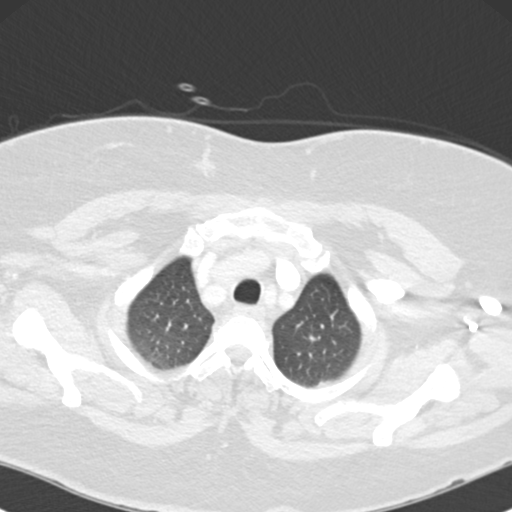
[im 138/150  lung]
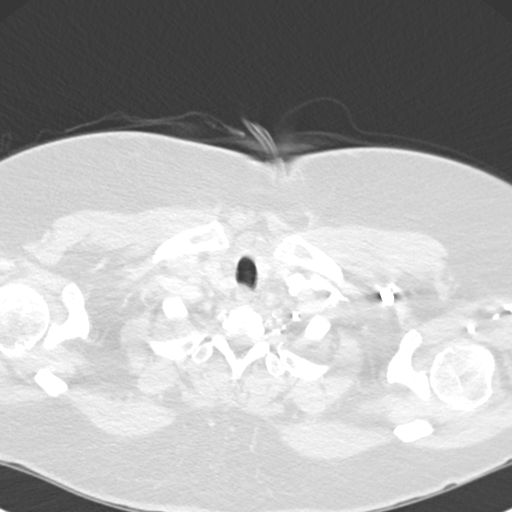

[Series 6: coronal · coronal · 0.60mm/px · 3 of 124 slices shown]
[im 25/124  lung]
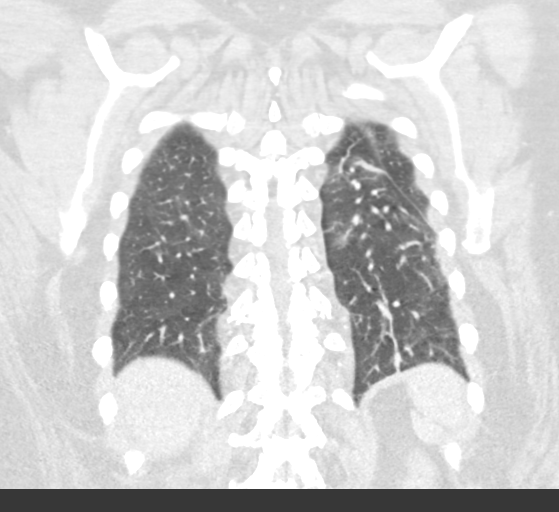
[im 50/124  lung]
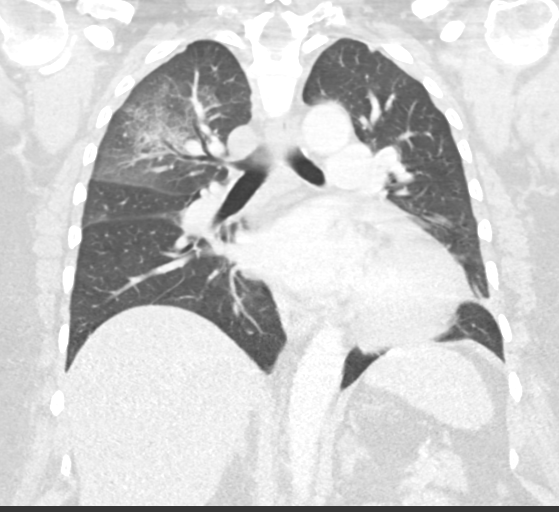
[im 74/124  lung]
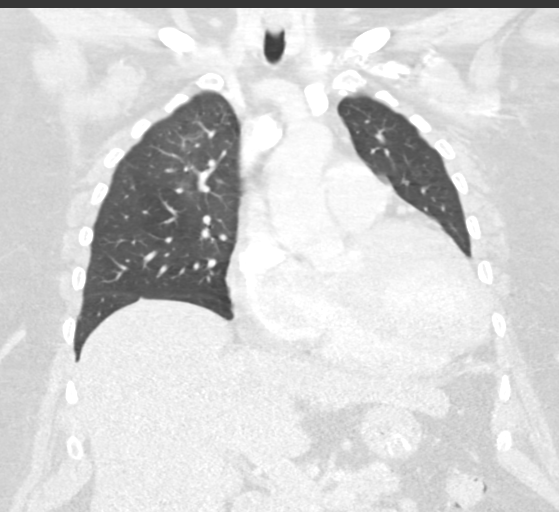

[15 of 36 positions shown; findings below may reference images not displayed]

FINDINGS: Cardiovascular: Thoracic aorta and pulmonary artery as visualized
are within normal limits. No significant coronary calcifications are
noted.

Mediastinum/Nodes: No significant hilar or mediastinal adenopathy is
noted. The thoracic inlet is within normal limits.

Lungs/Pleura: The lungs are well aerated bilaterally. Mild scarring
is noted in the left lower lobe. Patchy infiltrate is noted in the
right upper lobe similar to that seen on recent CT examination.

Upper Abdomen: Within normal limits.

Musculoskeletal: Within normal limits for the patient's age.
IMPRESSION: Right upper lobe pneumonia.

## 2018-08-05 ENCOUNTER — Encounter: Payer: Self-pay | Admitting: Registered Nurse

## 2018-08-05 ENCOUNTER — Ambulatory Visit (INDEPENDENT_AMBULATORY_CARE_PROVIDER_SITE_OTHER): Payer: Medicare HMO | Admitting: Registered Nurse

## 2018-08-05 ENCOUNTER — Other Ambulatory Visit: Payer: Self-pay

## 2018-08-05 VITALS — BP 150/80 | HR 71 | Temp 99.0°F | Resp 16 | Ht 62.6 in | Wt 171.0 lb

## 2018-08-05 DIAGNOSIS — Z13228 Encounter for screening for other metabolic disorders: Secondary | ICD-10-CM

## 2018-08-05 DIAGNOSIS — F3164 Bipolar disorder, current episode mixed, severe, with psychotic features: Secondary | ICD-10-CM

## 2018-08-05 DIAGNOSIS — E1122 Type 2 diabetes mellitus with diabetic chronic kidney disease: Secondary | ICD-10-CM | POA: Diagnosis not present

## 2018-08-05 DIAGNOSIS — M109 Gout, unspecified: Secondary | ICD-10-CM

## 2018-08-05 DIAGNOSIS — N183 Chronic kidney disease, stage 3 unspecified: Secondary | ICD-10-CM

## 2018-08-05 DIAGNOSIS — Z742 Need for assistance at home and no other household member able to render care: Secondary | ICD-10-CM

## 2018-08-05 DIAGNOSIS — N3281 Overactive bladder: Secondary | ICD-10-CM | POA: Diagnosis not present

## 2018-08-05 DIAGNOSIS — E785 Hyperlipidemia, unspecified: Secondary | ICD-10-CM

## 2018-08-05 DIAGNOSIS — Z13220 Encounter for screening for lipoid disorders: Secondary | ICD-10-CM | POA: Diagnosis not present

## 2018-08-05 DIAGNOSIS — Z1329 Encounter for screening for other suspected endocrine disorder: Secondary | ICD-10-CM

## 2018-08-05 DIAGNOSIS — Z130 Encounter for screening for diseases of the blood and blood-forming organs and certain disorders involving the immune mechanism: Secondary | ICD-10-CM

## 2018-08-05 MED ORDER — ATORVASTATIN CALCIUM 80 MG PO TABS: 80.0000 mg | ORAL_TABLET | Freq: Every day | ORAL | 0 refills | Status: DC

## 2018-08-05 NOTE — Patient Instructions (Signed)
° ° ° °  If you have lab work done today you will be contacted with your lab results within the next 2 weeks.  If you have not heard from us then please contact us. The fastest way to get your results is to register for My Chart. ° ° °IF you received an x-ray today, you will receive an invoice from [facility]. Please contact [facility] at [phone] with questions or concerns regarding your invoice.  ° °IF you received labwork today, you will receive an invoice from [facility]. Please contact [facility] at [phone] with questions or concerns regarding your invoice.  ° °Our billing staff will not be able to assist you with questions regarding bills from these companies. ° °You will be contacted with the lab results as soon as they are available. The fastest way to get your results is to activate your My Chart account. Instructions are located on the last page of this paperwork. If you have not heard from us regarding the results in 2 weeks, please contact this office. °  ° ° ° °

## 2018-08-05 NOTE — Progress Notes (Signed)
Established Patient Office Visit  Subjective:  Patient ID: Tiffany [name], female    DOB: 06/04/1960  Age: 58 y.o. MRN: [id number]  CC:  Chief Complaint  Patient presents with  . Establish Care    HPI [name] presents for visit to establish care. She has a complex medical history, as such, the vast majority of this visit was spent discussing her medical history and providing counseling on medications.  [name] acutely aware of what medications she takes, however, she unfortunately had a number of bottles open and spill into the bottom of the bag she keeps them in. We were able to sort these out.  She states she has been seen by Rheumatology, but did not like her provider. She requests a referral to a different provider at this time.  She c/o urinary incontinence, up to 5-6x daily, for which she wears diapers. She was followed by urology in the past and has tried a number of medications, none of which have been effective. [name] hoping to reestablish with urology.   Her therapist is moving out of the area and she needs a new therapist.  She lost orders for Palliative Care and Home health. We will resend orders.  Routine labs collected. Will follow as warranted.   Past Medical History:  Diagnosis Date  . Anxiety   . Arthritis    "all over" (04/10/2016)  . Asthma   . Cardiac arrest ([place]) 09/08/2016   PEA  . Carotid artery stenosis    1-39% bilateral by dopplers 11/2016  . Chronic bronchitis ([place])   . Chronic diastolic (congestive) heart failure ([place])   . Chronic kidney disease    "I see a kidney dr." (04/10/2016)  . Cocaine abuse ([place])   . Complication of anesthesia    decreased bp, decreased heart rate  . Depression   . Disorder of nervous system   . Emphysema   . GERD (gastroesophageal reflux disease)   . Heart attack ([place]) 1980s  . History of blood transfusion 1994   "couldn't stop bleeding from my period"  . Hyperlipidemia LDL goal <70   . Hypertension   .  Incontinence   . Manic depression ([place])   . On home oxygen therapy    "6L; 24/7" (04/10/2016)  . OSA on CPAP    "wear mask sometimes" (04/10/2016)  . Paranoid ([place])    "sometimes; I'm on RX for it" (04/10/2016)  . Pneumonia    "I've had it several times; haven't had it since 06/2015" (04/10/2016)  . Schizophrenia ([place])   . Seasonal allergies   . Seizures ([place])    "don't know what kind; last one was ~ 1 yr ago" (04/10/2016)  . Sinus trouble   . Stroke [facility]) 1980s   denies residual on 04/10/2016  . Type II diabetes mellitus ([place])     Past Surgical History:  Procedure Laterality Date  . CESAREAN SECTION  1997  . HERNIA REPAIR    . IR CHOLANGIOGRAM EXISTING TUBE  07/20/2016  . IR PERC CHOLECYSTOSTOMY  05/10/2016  . IR RADIOLOGIST EVAL & MGMT  06/08/2016  . IR RADIOLOGIST EVAL & MGMT  06/29/2016  . IR SINUS/FIST TUBE CHK-NON GI  07/12/2016  . RIGHT/LEFT HEART CATH AND CORONARY ANGIOGRAPHY N/A 06/19/2017   Procedure: RIGHT/LEFT HEART CATH AND CORONARY ANGIOGRAPHY;  Surgeon: Jolaine Artist, MD;  Location: [facility];  Service: Cardiovascular;  Laterality: N/A;  . TIBIA IM NAIL INSERTION Right 07/12/2016   Procedure:  INTRAMEDULLARY (IM) NAIL RIGHT TIBIA;  Surgeon: Leandrew Koyanagi, MD;  Location: [name];  Service: Orthopedics;  Laterality: Right;  . UMBILICAL HERNIA REPAIR  ~ 1963   "that's why I don't have a belly button"  . VAGINAL HYSTERECTOMY      Family History  Problem Relation Age of Onset  . Cancer Father        prostate  . Cancer Mother        lung  . Depression Mother   . Depression Sister   . Anxiety disorder Sister   . Schizophrenia Sister   . Bipolar disorder Sister   . Depression Sister   . Depression Brother   . Heart failure Other        cousin    Social History   Socioeconomic History  . Marital status: Widowed    Spouse name: Not on file  . Number of children: 3  . Years of education: Not on file  . Highest education level: Not on file  Occupational  History  . 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 Financial resource strain: Not on file  . Food insecurity    Worry: Sometimes true    Inability: Never true  . Transportation needs    Medical: No    Non-medical: No  Tobacco Use  . Smoking status: Former Smoker    Packs/day: 1.50    Years: 38.00    Pack years: 57.00    Types: Cigarettes    Start date: 03/13/1977    Quit date: 04/10/2016    Years since quitting: 2.3  . Smokeless tobacco: Never Used  Substance and Sexual Activity  . Alcohol use: No    Alcohol/week: 0.0 standard drinks  . Drug use: No    Types: Cocaine    Comment: 04/10/2016 "last used cocaine back in November 2017"  . Sexual activity: Not Currently    Birth control/protection: Surgical  Lifestyle  . Physical activity    Days per week: Not on file    Minutes per session: Not on file  . Stress: Not on file  Relationships  . Social Herbalist on phone: Not on file    Gets together: Not on file    Attends religious service: Not on file    Active member of club or organization: Not on file    Attends meetings of clubs or organizations: Not on file    Relationship status: Not on file  . Intimate partner violence    Fear of current or ex partner: Not on file    Emotionally abused: Not on file    Physically abused: Not on file    Forced sexual activity: Not on file  Other Topics Concern  . Not on file  Social History Narrative   Has 1 son, [name]   Lives with son and his boyfriend   Her house has ramps and handrails should she ever needs them.    Her mother lives down the street from her and is a good support person in addition to her son.   She drives herself, has private transportation.    Cocaine free since 02/24/16, smoke free since 04/10/16    Outpatient Medications Prior to Visit  Medication Sig Dispense Refill  . albuterol (PROVENTIL) (2.5 MG/3ML) 0.083% nebulizer solution Take 3 mLs (2.5 mg total) by  nebulization every 6 (six) hours as needed for wheezing or shortness of breath. 150 mL 1  .  albuterol (VENTOLIN HFA) 108 (90 Base) MCG/ACT inhaler Inhale 2 puffs into the lungs every 6 (six) hours as needed for wheezing or shortness of breath. 1 Inhaler 2  . albuterol (VENTOLIN HFA) 108 (90 Base) MCG/ACT inhaler Inhale into the lungs.    . Albuterol Sulfate 2.5 MG/0.5ML NEBU Inhale into the lungs.    [name] amLODipine (NORVASC) 5 MG tablet Take 5 mg by mouth daily.    . ARIPiprazole (ABILIFY) 2 MG tablet Take by mouth.    [name] aspirin (GOODSENSE ASPIRIN LOW DOSE) 81 MG EC tablet TAKE 1 TABLET (81 MG TOTAL) BY MOUTH DAILY (MORNING). (Patient taking differently: Take 81 mg by mouth daily. ) 90 tablet 3  . budesonide-formoterol (SYMBICORT) 80-4.5 MCG/ACT inhaler Inhale into the lungs.    [name] buPROPion (WELLBUTRIN XL) 150 MG 24 hr tablet 3 by mouth EVERY MORNING    . cephALEXin (KEFLEX) 250 MG capsule     . cloNIDine (CATAPRES) 0.2 MG tablet Take by mouth.    . colchicine (COLCRYS) 0.6 MG tablet TAKE 1 TABLET (0.6 MG TOTAL) BY MOUTH 2 (TWO) TIMES DAILY. (Patient taking differently: Take 0.6 mg by mouth 2 (two) times daily. ) 180 tablet 3  . cyclobenzaprine (FLEXERIL) 5 MG tablet     . diazepam (VALIUM) 10 MG tablet Take 1 tablet (10 mg total) by mouth 3 (three) times daily. 270 tablet 0  . diclofenac (VOLTAREN) 75 MG EC tablet Take by mouth.    . diclofenac sodium (VOLTAREN) 1 % GEL Apply 2 g topically 4 (four) times daily. (Patient taking differently: Apply 2 g topically 4 (four) times daily as needed (pain). ) 100 g 0  . docusate sodium (COLACE) 100 MG capsule Take by mouth.    . DULoxetine (CYMBALTA) 30 MG capsule 1 by mouth every am and 2 by mouth every pm    . escitalopram (LEXAPRO) 20 MG tablet Take 2 tablets (40 mg total) by mouth daily. 180 tablet 0  . febuxostat (ULORIC) 40 MG tablet Take 1 tablet by mouth daily.    . ferrous sulfate (FEROSUL) 325 (65 FE) MG tablet TAKE 1 TABLET BY MOUTH  DAILY.(MORNING) (Patient taking differently: Take 325 mg by mouth daily with breakfast. ) 90 tablet 3  . flunisolide (NASALIDE) 25 MCG/ACT (0.025%) SOLN Place 2 sprays into the nose daily. 1 Bottle 2  . fluticasone (FLONASE) 50 MCG/ACT nasal spray Place into the nose.    [name] Fluticasone-Umeclidin-Vilant (TRELEGY ELLIPTA) 100-62.5-25 MCG/INH AEPB Inhale 1 puff into the lungs daily. 60 each 3  . gabapentin (NEURONTIN) 600 MG tablet Take 1 tablet (600 mg total) by mouth 2 (two) times daily. (Patient taking differently: Take 1,200 mg by mouth 3 (three) times daily. ) 360 tablet 0  . glucose blood (PRECISION QID TEST) test strip by Does not apply route.    . hydrochlorothiazide (MICROZIDE) 12.5 MG capsule Take by mouth.    [name] ipratropium (ATROVENT) 0.03 % nasal spray Place into the nose.    . lisdexamfetamine (VYVANSE) 70 MG capsule Take 1 capsule (70 mg total) by mouth daily for 30 days. 30 capsule 0  . methocarbamol (ROBAXIN) 500 MG tablet Take by mouth.    . metolazone (ZAROXOLYN) 2.5 MG tablet Take 1 tablet (2.5 mg total) by mouth daily. 90 tablet 0  . miconazole (MICONAZOLE 7) 2 % vaginal cream Place 1 Applicatorful vaginally at bedtime. 45 g 0  . montelukast (SINGULAIR) 10 MG tablet Take by mouth.    . montelukast (SINGULAIR) 4 MG  chewable tablet Chew by mouth.    . Multiple Vitamin (MULTIVITAMIN WITH MINERALS) TABS tablet Take 1 tablet by mouth daily.    [name] omeprazole (PRILOSEC) 40 MG capsule Take 1 capsule (40 mg total) by mouth 2 (two) times daily. 180 capsule 3  . oxybutynin (DITROPAN-XL) 10 MG 24 hr tablet TAKE 1 TABLET BY MOUTH EVERYDAY AT BEDTIME (Patient taking differently: Take 10 mg by mouth at bedtime. ) 90 tablet 3  . polyethylene glycol (MIRALAX / GLYCOLAX) 17 g packet Take by mouth.    . potassium chloride SA (K-DUR) 20 MEQ tablet Take by mouth.    [name] SAPHRIS 2.5 MG SUBL Place 1 tablet (2.5 mg total) under the tongue 3 (three) times daily. 270 tablet 0  . simvastatin (ZOCOR) 40 MG  tablet Take by mouth.    . terconazole (TERAZOL 3) 0.8 % vaginal cream Apply pea sized amount to clitoris nightly. 20 g 0  . torsemide (DEMADEX) 20 MG tablet Take 4 tablets (80 mg total) by mouth 2 (two) times daily. 240 tablet 4  . traZODone (DESYREL) 100 MG tablet Take 2 tablets (200 mg total) by mouth at bedtime. 180 tablet 0  . valACYclovir (VALTREX) 500 MG tablet Take by mouth.    . valsartan (DIOVAN) 80 MG tablet Take by mouth.    . venlafaxine (EFFEXOR) 75 MG tablet Take by mouth.    . VRAYLAR 4.5 MG CAPS Take 4.5 mg by mouth daily.    [name] zinc oxide 20 % ointment Apply 1 application topically as needed for irritation. 56.7 g 5  . ziprasidone (GEODON) 80 MG capsule 1 by mouth every am and 1 by mouth every pm    . amLODipine (NORVASC) 10 MG tablet Take by mouth.    [name] atorvastatin (LIPITOR) 80 MG tablet Take 80 mg by mouth daily.    [name] omeprazole (PRILOSEC) 20 MG capsule Take by mouth.    . oxybutynin (DITROPAN-XL) 10 MG 24 hr tablet Take by mouth.    . Colchicine 0.6 MG CAPS     . methocarbamol (ROBAXIN) 500 MG tablet     . montelukast (SINGULAIR) 10 MG tablet Take 1 tablet (10 mg total) by mouth at bedtime. 90 tablet 3  . potassium chloride (K-DUR) 10 MEQ tablet Take 6 tablets by mouth 2 (two) times a day.     No facility-administered medications prior to visit.     Allergies  Allergen Reactions  . Hydrocodone-Acetaminophen Shortness Of Breath  . Hydroxyzine Anaphylaxis and Shortness Of Breath  . Latuda [Lurasidone Hcl] Anaphylaxis  . Magnesium-Containing Compounds Anaphylaxis  . Prednisone Anaphylaxis, Swelling and Other (See Comments)    Tongue swelling  . Tramadol Anaphylaxis  . Codeine Nausea And Vomiting  . Sulfa Antibiotics Itching  . Tape Rash    ROS Review of Systems  Constitutional: Positive for fatigue.  HENT: Negative.   Eyes: Negative.   Respiratory: Positive for shortness of breath.   Cardiovascular: Positive for leg swelling.  Gastrointestinal: Negative.    Endocrine: Negative.   Genitourinary: Positive for urgency.  Musculoskeletal: Positive for arthralgias and myalgias.  Skin: Negative.   Allergic/Immunologic: Negative.   Neurological: Negative.   Hematological: Negative.   Psychiatric/Behavioral: Positive for agitation and dysphoric mood. The patient is nervous/anxious.       Objective:    Physical Exam  Constitutional: [name] oriented to person, place, and time. She appears well-developed and well-nourished. No distress.  Cardiovascular: Normal rate and regular rhythm.  Pulmonary/Chest: Effort normal. No  respiratory distress.  Abdominal: Soft. She exhibits no distension.  Neurological: [name] alert and oriented to person, place, and time.  Skin: Skin is warm and dry. No rash noted. [name] not diaphoretic. No erythema. No pallor.  Psychiatric: She has a normal mood and affect. Her behavior is normal. Judgment and thought content normal.  Nursing note and vitals reviewed.   BP (!) 150/80   Pulse 71   Temp 99 F (37.2 C) (Oral)   Resp 16   Ht 5' 2.6" (1.59 m)   Wt 171 lb (77.6 kg)   SpO2 97% Comment: on oxygen  BMI 30.68 kg/m  Wt Readings from Last 3 Encounters:  08/05/18 171 lb (77.6 kg)  03/25/18 159 lb (72.1 kg)  03/25/18 159 lb (72.1 kg)     Health Maintenance Due  Topic Date Due  . COLONOSCOPY  05/10/2009  . FOOT EXAM  03/12/2018  . URINE MICROALBUMIN  03/12/2018  . OPHTHALMOLOGY EXAM  04/20/2018    There are no preventive care reminders to display for this patient.  Lab Results  Component Value Date   TSH 0.563 03/15/2016   Lab Results  Component Value Date   WBC 4.9 02/27/2018   HGB 9.3 (L) 02/27/2018   HCT 30.8 (L) 02/27/2018   MCV 94.2 02/27/2018   PLT 367 02/27/2018   Lab Results  Component Value Date   NA 140 02/27/2018   K 3.2 (L) 02/27/2018   CO2 22 02/27/2018   GLUCOSE 78 02/27/2018   BUN 15 02/27/2018   CREATININE 2.38 (H) 02/27/2018   BILITOT 0.4 12/25/2017   ALKPHOS 186 (H)  12/25/2017   AST 28 12/25/2017   ALT 23 12/25/2017   PROT 6.2 (L) 12/25/2017   ALBUMIN 3.0 (L) 12/25/2017   CALCIUM 8.9 02/27/2018   ANIONGAP 10 02/27/2018   Lab Results  Component Value Date   CHOL 129 01/07/2017   Lab Results  Component Value Date   HDL 35 (L) 01/07/2017   Lab Results  Component Value Date   LDLCALC 56 01/07/2017   Lab Results  Component Value Date   TRIG 121 11/21/2017   Lab Results  Component Value Date   CHOLHDL 3.7 01/07/2017   Lab Results  Component Value Date   HGBA1C 4.7 (L) 04/05/2018      Assessment & Plan:   Problem List Items Addressed This Visit      Endocrine   Type 2 diabetes mellitus (HCC) (Chronic)   Relevant Medications   simvastatin (ZOCOR) 40 MG tablet   valsartan (DIOVAN) 80 MG tablet   atorvastatin (LIPITOR) 80 MG tablet   Other Relevant Orders   Amb Referral to Palliative Care     Genitourinary   Overactive bladder   Relevant Orders   Ambulatory referral to Urology     Other   Dyslipidemia   Relevant Medications   simvastatin (ZOCOR) 40 MG tablet   atorvastatin (LIPITOR) 80 MG tablet   Bipolar affective disorder, mixed, severe, with psychotic behavior ([place])   Relevant Orders   Ambulatory referral to Psychology    Other Visit Diagnoses    Screening for endocrine, metabolic and immunity disorder    -  Primary   Relevant Orders   CBC with Differential/Platelet   Comprehensive metabolic panel   Hemoglobin A1c   Lipid screening       Relevant Orders   Lipid panel   Need for home health care       Relevant Orders   Ambulatory  referral to Home Health   Gout, arthritis       Relevant Medications   cyclobenzaprine (FLEXERIL) 5 MG tablet   diclofenac (VOLTAREN) 75 MG EC tablet   methocarbamol (ROBAXIN) 500 MG tablet   Other Relevant Orders   Ambulatory referral to Rheumatology      Meds ordered this encounter  Medications  . atorvastatin (LIPITOR) 80 MG tablet    Sig: Take 1 tablet (80 mg total) by  mouth daily.    Dispense:  90 tablet    Refill:  0    Order Specific Question:   Supervising Provider    Answer:   Forrest Moron [id number]    Follow-up: No follow-ups on file.   PLAN:  Orders as listed: labs, referrals ordered and will be followed up on in two weeks.  Atorvastatin refill given. Pt denies need for other refills.  Labs will be followed as warranted and repeated at two week follow up if necessary.  This patient will require close follow up as we establish care. Once home health is on board, we will be able to space out follow up. But for now, it will be every 2-4 weeks.  Patient encouraged to call clinic with any questions, comments, or concerns.      Maximiano Coss, NP

## 2018-08-06 ENCOUNTER — Telehealth: Payer: Self-pay | Admitting: Registered Nurse

## 2018-08-06 ENCOUNTER — Ambulatory Visit (HOSPITAL_COMMUNITY): Payer: Medicare HMO | Admitting: Psychiatry

## 2018-08-06 LAB — COMPREHENSIVE METABOLIC PANEL
ALT: 6 IU/L (ref 0–32)
AST: 11 IU/L (ref 0–40)
Albumin/Globulin Ratio: 1.8 (ref 1.2–2.2)
Albumin: 3.8 g/dL (ref 3.8–4.9)
Alkaline Phosphatase: 110 IU/L (ref 39–117)
BUN/Creatinine Ratio: 15 (ref 9–23)
BUN: 19 mg/dL (ref 6–24)
Bilirubin Total: <0.2 mg/dL (ref 0.0–1.2)
CO2: 25 mmol/L (ref 20–29)
Calcium: 9.3 mg/dL (ref 8.7–10.2)
Chloride: 104 mmol/L (ref 96–106)
Creatinine, Ser: 1.27 mg/dL — ABNORMAL HIGH (ref 0.57–1.00)
GFR calc Af Amer: 53 mL/min/{1.73_m2} — ABNORMAL LOW (ref >59)
GFR calc non Af Amer: 46 mL/min/{1.73_m2} — ABNORMAL LOW (ref >59)
Globulin, Total: 2.1 g/dL (ref 1.5–4.5)
Glucose: 79 mg/dL (ref 65–99)
Potassium: 4 mmol/L (ref 3.5–5.2)
Sodium: 141 mmol/L (ref 134–144)
Total Protein: 5.9 g/dL — ABNORMAL LOW (ref 6.0–8.5)

## 2018-08-06 LAB — LIPID PANEL
Chol/HDL Ratio: 2 ratio (ref 0.0–4.4)
Cholesterol, Total: 127 mg/dL (ref 100–199)
HDL: 62 mg/dL (ref >39)
LDL Calculated: 54 mg/dL (ref 0–99)
Triglycerides: 53 mg/dL (ref 0–149)
VLDL Cholesterol Cal: 11 mg/dL (ref 5–40)

## 2018-08-06 LAB — CBC WITH DIFFERENTIAL/PLATELET
Basophils Absolute: 0 10*3/uL (ref 0.0–0.2)
Basos: 0 %
EOS (ABSOLUTE): 0.2 10*3/uL (ref 0.0–0.4)
Eos: 4 %
Hematocrit: 26.9 % — ABNORMAL LOW (ref 34.0–46.6)
Hemoglobin: 8.6 g/dL — ABNORMAL LOW (ref 11.1–15.9)
Immature Grans (Abs): 0 10*3/uL (ref 0.0–0.1)
Immature Granulocytes: 0 %
Lymphocytes Absolute: 1.8 10*3/uL (ref 0.7–3.1)
Lymphs: 33 %
MCH: 28.5 pg (ref 26.6–33.0)
MCHC: 32 g/dL (ref 31.5–35.7)
MCV: 89 fL (ref 79–97)
Monocytes Absolute: 0.4 10*3/uL (ref 0.1–0.9)
Monocytes: 7 %
Neutrophils Absolute: 3 10*3/uL (ref 1.4–7.0)
Neutrophils: 56 %
Platelets: 362 10*3/uL (ref 150–450)
RBC: 3.02 x10E6/uL — ABNORMAL LOW (ref 3.77–5.28)
RDW: 14.7 % (ref 11.7–15.4)
WBC: 5.4 10*3/uL (ref 3.4–10.8)

## 2018-08-06 LAB — HEMOGLOBIN A1C
Est. average glucose Bld gHb Est-mCnc: 94 mg/dL
Hgb A1c MFr Bld: 4.9 % (ref 4.8–5.6)

## 2018-08-06 NOTE — Progress Notes (Signed)
Reviewed results. Pt is establishing care with myself and I will see her again in 2 weeks to review results in person. Labs are as expected - Anemia d/t CKD, kidney function diminished on CMP, otherwise, lipids and A1c well within control. No urgent concerns at this time. Will continue to work with this patient to establish routine care.  Kathrin Ruddy, NP

## 2018-08-06 NOTE — Telephone Encounter (Signed)
08/06/2018 - PATIENT SAW [name] MORROW ON Monday (08/05/2018) TO ESTABLISH CARE. HE REQUESTED FOR HER TO FOLLOW-UP WITH HIM IN 2 WEEKS. I NEVER SAW HER COME TO CHECK-OUT. I TRIED TO CALL AND SCHEDULE TODAY BUT HAD TO LEAVE HER A VOICE MAIL TO RETURN OUR CALL. Tiffany [name]

## 2018-08-07 ENCOUNTER — Telehealth (HOSPITAL_COMMUNITY): Payer: Self-pay

## 2018-08-07 NOTE — Telephone Encounter (Signed)
Patient is calling for a refill on her Vyvanse.

## 2018-08-09 ENCOUNTER — Telehealth: Payer: Self-pay | Admitting: Internal Medicine

## 2018-08-09 ENCOUNTER — Other Ambulatory Visit: Payer: Self-pay

## 2018-08-09 ENCOUNTER — Ambulatory Visit (INDEPENDENT_AMBULATORY_CARE_PROVIDER_SITE_OTHER): Payer: Medicare HMO | Admitting: Psychiatry

## 2018-08-09 DIAGNOSIS — F4312 Post-traumatic stress disorder, chronic: Secondary | ICD-10-CM

## 2018-08-09 DIAGNOSIS — F250 Schizoaffective disorder, bipolar type: Secondary | ICD-10-CM | POA: Diagnosis not present

## 2018-08-09 DIAGNOSIS — F1421 Cocaine dependence, in remission: Secondary | ICD-10-CM | POA: Diagnosis not present

## 2018-08-09 DIAGNOSIS — F5081 Binge eating disorder: Secondary | ICD-10-CM | POA: Diagnosis not present

## 2018-08-09 MED ORDER — SAPHRIS 2.5 MG SL SUBL: 2.5000 mg | SUBLINGUAL_TABLET | Freq: Three times a day (TID) | SUBLINGUAL | 0 refills | Status: DC

## 2018-08-09 MED ORDER — ESCITALOPRAM OXALATE 20 MG PO TABS: 40.0000 mg | ORAL_TABLET | Freq: Every day | ORAL | 0 refills | Status: DC

## 2018-08-09 MED ORDER — DIAZEPAM 10 MG PO TABS: 10.0000 mg | ORAL_TABLET | Freq: Three times a day (TID) | ORAL | 0 refills | Status: DC

## 2018-08-09 MED ORDER — TRAZODONE HCL 100 MG PO TABS: 200.0000 mg | ORAL_TABLET | Freq: Every day | ORAL | 0 refills | Status: DC
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
## 2018-08-09 NOTE — Telephone Encounter (Signed)
Spoke with patient and have scheduled a Telephone Palliative Consult for 08/12/18 @ 3 PM.

## 2018-08-12 ENCOUNTER — Telehealth: Payer: Self-pay | Admitting: Registered Nurse

## 2018-08-12 ENCOUNTER — Other Ambulatory Visit (HOSPITAL_COMMUNITY): Payer: Self-pay | Admitting: Psychiatry

## 2018-08-12 ENCOUNTER — Telehealth (HOSPITAL_COMMUNITY): Payer: Self-pay

## 2018-08-12 ENCOUNTER — Other Ambulatory Visit: Payer: Medicare HMO | Admitting: Internal Medicine

## 2018-08-12 ENCOUNTER — Other Ambulatory Visit: Payer: Self-pay

## 2018-08-12 MED ORDER — LISDEXAMFETAMINE DIMESYLATE 70 MG PO CAPS: 70.0000 mg | ORAL_CAPSULE | Freq: Every day | ORAL | 0 refills | Status: DC

## 2018-08-12 NOTE — Telephone Encounter (Signed)
Called pt to schedule 2 week f/u with Rich. Pt was last seen on 7/20. [email or web address]

## 2018-08-12 NOTE — Telephone Encounter (Signed)
Done

## 2018-08-12 NOTE — Telephone Encounter (Signed)
Patient called, she would like a refill on her Vyvanse.

## 2018-08-12 NOTE — Progress Notes (Signed)
Virtual Visit via Telephone Note  I connected with ANISTYN GRADDY on 08/13/18 at 11:15 AM EDT by telephone and verified that I am speaking with the correct person using two identifiers.  Location: Patient: Home Provider: [facility] - [address], [place], [place], [place] [address]   I discussed the limitations, risks, security and privacy concerns of performing an evaluation and management service by telephone and the availability of in person appointments. I also discussed with the patient that there may be a patient responsible charge related to this service. The patient expressed understanding and agreed to proceed.  Patient consented to consult via telephone: Yes People present and their role in pt care: Pt    History of Present Illness:  58 year old female former smoker (quit 2018, 57-pack-year smoking history) followed in our office for COPD with asthmatic component, obstructive sleep apnea and obesity hypoventilation syndrome.  Maintained on trilogy ventilator.  Past medical history: Obesity, schizophrenia Smoking history: Former smoker.  Quit 2018.  57-pack-year smoking history Maintenance: Trelegy Ellipta Patient of Dr. Lamonte Sakai  Chief complaint: 3 month follow up    58 year old female former smoker completing a 21-month tele-visit with our office today.  Patient is followed in our office for COPD with an asthmatic component as well as OSA and OHS.  [name] maintained on a trilogy ventilator.  Patient is also using Trelegy Ellipta as an inhaler.  [name] requesting refills of her Trelegy Ellipta inhaler as well as her albuterol today.  She has chronic respiratory failure which she manages with a portable oxygen concentrator during the day.  [name] having to use her rescue inhaler daily.  Patient with baseline schizophrenia and is having difficulty today on the tele-visit to maintain attention and focus.  It sounds as if patient is currently interacting with lots of  people while doing this office visit.  Patient's focus is intermittent and sporadic.  Patient may also be potentially moving at this time.  [name] given instructions to people as I am talking to her over the phone.  Patient's focus on today's tele-visit was on her electric wheelchair which she has not been able to use as where she currently lives is not accessible to electric wheelchair.  She her goal is to find a residence that is accessible to an electric wheelchair so that way she can resume using it.  Observations/Objective:  Patient is very distracted on telephone call today Patient has intermittent focus Patient occasionally has shortness of breath but she reports to me that this is her baseline  04/12/2016-pulmonary function test- FVC 1.70 (63% predicted), FEV1 1.13 (53% predicted), ratio 66  10/30/2016-echocardiogram-LV ejection fraction 60 to 65%, moderate LVH  Assessment and Plan:  Obstructive sleep apnea Plan: Continue trilogy ventilator  COPD (chronic obstructive pulmonary disease) ([place]) Plan: Continue Trelegy Ellipta inhaler, refilled today Continue albuterol rescue inhaler, refilled today Patient would like to follow-up with Dr. Lamonte Sakai in August/2020, appointment scheduled today We will try to complete pulmonary function testing prior to appointment with Dr. Lamonte Sakai in August/2020  Chronic respiratory failure with hypoxia and hypercapnia ([place]) Plan: Continue trilogy ventilator Continue oxygen therapy as prescribed  Tobacco use disorder Former smoker Stop 2018 57-pack-year smoking history  Plan: Congratulated patient on stopping smoking Discussed lung cancer screening program with patient today, patient agrees to lung cancer screening   Follow Up Instructions:  Return in about 6 weeks (around 09/24/2018), or if symptoms worsen or fail to improve, for Follow up with  Dr. Lamonte Sakai, Follow up for [facility] Meeting with SG, Follow up for PFT.   I discussed the  assessment and treatment plan with the patient. The patient was provided an opportunity to ask questions and all were answered. The patient agreed with the plan and demonstrated an understanding of the instructions.   The patient was advised to call back or seek an in-person evaluation if the symptoms worsen or if the condition fails to improve as anticipated.  I provided 24 minutes of non-face-to-face time during this encounter.   Lauraine Rinne, NP

## 2018-08-13 ENCOUNTER — Ambulatory Visit (INDEPENDENT_AMBULATORY_CARE_PROVIDER_SITE_OTHER): Payer: Medicare HMO | Admitting: Pulmonary Disease

## 2018-08-13 ENCOUNTER — Ambulatory Visit: Payer: Self-pay | Admitting: Emergency Medicine

## 2018-08-13 ENCOUNTER — Encounter: Payer: Self-pay | Admitting: Pulmonary Disease

## 2018-08-13 ENCOUNTER — Other Ambulatory Visit: Payer: Self-pay

## 2018-08-13 ENCOUNTER — Ambulatory Visit (HOSPITAL_COMMUNITY): Payer: Medicare HMO | Admitting: Psychiatry

## 2018-08-13 DIAGNOSIS — J9611 Chronic respiratory failure with hypoxia: Secondary | ICD-10-CM | POA: Diagnosis not present

## 2018-08-13 DIAGNOSIS — J42 Unspecified chronic bronchitis: Secondary | ICD-10-CM

## 2018-08-13 DIAGNOSIS — J9612 Chronic respiratory failure with hypercapnia: Secondary | ICD-10-CM | POA: Diagnosis not present

## 2018-08-13 DIAGNOSIS — G4733 Obstructive sleep apnea (adult) (pediatric): Secondary | ICD-10-CM | POA: Diagnosis not present

## 2018-08-13 DIAGNOSIS — F17200 Nicotine dependence, unspecified, uncomplicated: Secondary | ICD-10-CM | POA: Diagnosis not present

## 2018-08-13 MED ORDER — TRELEGY ELLIPTA 100-62.5-25 MCG/INH IN AEPB: 1.0000 | INHALATION_SPRAY | Freq: Every day | RESPIRATORY_TRACT | 3 refills | Status: DC

## 2018-08-13 MED ORDER — ALBUTEROL SULFATE HFA 108 (90 BASE) MCG/ACT IN AERS: 2.0000 | INHALATION_SPRAY | Freq: Four times a day (QID) | RESPIRATORY_TRACT | 2 refills | Status: DC | PRN

## 2018-08-13 NOTE — Assessment & Plan Note (Signed)
Plan: Continue trilogy ventilator

## 2018-08-13 NOTE — Assessment & Plan Note (Signed)
Plan: Continue trilogy ventilator Continue oxygen therapy as prescribed

## 2018-08-13 NOTE — Assessment & Plan Note (Signed)
Former smoker Stop 2018 57-pack-year smoking history  Plan: Congratulated patient on stopping smoking Discussed lung cancer screening program with patient today, patient agrees to lung cancer screening

## 2018-08-13 NOTE — Patient Instructions (Addendum)
Trelegy Ellipta  >>> 1 puff daily in the morning >>>rinse mouth out after use  >>> This inhaler contains 3 medications that help manage her respiratory status, contact our office if you cannot afford this medication or unable to remain on this medication  Only use your albuterol as a rescue medication to be used if you can't catch your breath by resting or doing a relaxed purse lip breathing pattern.  - The less you use it, the better it will work when you need it. - Ok to use up to 2 puffs  every 4 hours if you must but call for immediate appointment if use goes up over your usual need - Don't leave home without it !!  (think of it like the spare tire for your car)   Continue Trilogy Vent at night   Continue oxygen therapy as prescribed  >>>maintain oxygen saturations greater than 88 percent  >>>if unable to maintain oxygen saturations please contact the office  >>>do not smoke with oxygen  >>>can use nasal saline gel or nasal saline rinses to moisturize nose if oxygen causes dryness  We will work to see if we can get you scheduled for pulmonary function test prior to August/2020 office visit with Dr. Lamonte Sakai   Return in about 3 months (around 11/13/2018), or if symptoms worsen or fail to improve, for Follow up with Dr. Lamonte Sakai, Follow up for Shared [place] with SG.

## 2018-08-13 NOTE — Assessment & Plan Note (Signed)
Plan: Continue Trelegy Ellipta inhaler, refilled today Continue albuterol rescue inhaler, refilled today Patient would like to follow-up with Dr. Lamonte Sakai in August/2020, appointment scheduled today We will try to complete pulmonary function testing prior to appointment with Dr. Lamonte Sakai in August/2020

## 2018-08-14 DIAGNOSIS — J449 Chronic obstructive pulmonary disease, unspecified: Secondary | ICD-10-CM | POA: Diagnosis not present

## 2018-08-14 DIAGNOSIS — M6281 Muscle weakness (generalized): Secondary | ICD-10-CM | POA: Diagnosis not present

## 2018-08-14 DIAGNOSIS — R062 Wheezing: Secondary | ICD-10-CM | POA: Diagnosis not present

## 2018-08-14 DIAGNOSIS — J45909 Unspecified asthma, uncomplicated: Secondary | ICD-10-CM | POA: Diagnosis not present

## 2018-08-15 ENCOUNTER — Telehealth: Payer: Self-pay | Admitting: Registered Nurse

## 2018-08-15 NOTE — Telephone Encounter (Signed)
No paperwork has ben faxed as of yet

## 2018-08-15 NOTE — Telephone Encounter (Signed)
Patient is calling to see if [facility] is going to fax some papperwork for  [name] to complete. The patient is asking if Tiffany [name] can note on the paperwork that the patient is supposed to use the electric chair to prevent falling and congestive heart failure.   Please advise [phone]

## 2018-08-15 NOTE — Telephone Encounter (Signed)
Copied from [place] [id number] [id number]. Topic: General - Inquiry >> Aug 15, 2018 12:51 PM Richardo Priest, NT wrote: Reason for CRM: Patient called in checking on status of form to be filled out. Please advise. Call back is [phone]

## 2018-08-16 ENCOUNTER — Telehealth: Payer: Self-pay | Admitting: Orthopaedic Surgery

## 2018-08-16 NOTE — Telephone Encounter (Signed)
I called patient highly recommended going to ER. States she has been walking. Only wants to see Dr. Erlinda Hong, I made an appointment Tuesday 8/4 9:30am.

## 2018-08-16 NOTE — Telephone Encounter (Signed)
Ok - I'd be happy to fill it out when it arrives.
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
## 2018-08-19 ENCOUNTER — Ambulatory Visit: Payer: Medicare HMO | Admitting: Registered Nurse

## 2018-08-19 ENCOUNTER — Telehealth: Payer: Self-pay

## 2018-08-19 NOTE — Telephone Encounter (Signed)
Pt says she has been talking to someone at [facility] and was told that the form has been faxed to our office. I confirmed the fax number with the pt. Pt will call [facility] to have fax sent. No form has been received

## 2018-08-19 NOTE — Telephone Encounter (Signed)
Pt says she has been talking to someone at [facility] and was told that the form has been faxed to our office. I confirmed the fax number with the pt. Pt will call [facility] to have fax sent. No form has been received

## 2018-08-19 NOTE — Telephone Encounter (Signed)
To my knowledge, we have not received a form to be filled out for Ms. Cavallero. I have been checking my inbox daily. Ms. Hinderliter has a visit with me today, if she has a copy of the forms with her, I'd be happy to make a copy and fill out forms that she needs. Thank you

## 2018-08-20 ENCOUNTER — Telehealth (HOSPITAL_COMMUNITY): Payer: Self-pay | Admitting: Psychiatry

## 2018-08-20 ENCOUNTER — Ambulatory Visit (INDEPENDENT_AMBULATORY_CARE_PROVIDER_SITE_OTHER): Payer: Medicare HMO | Admitting: Orthopaedic Surgery

## 2018-08-20 ENCOUNTER — Ambulatory Visit (INDEPENDENT_AMBULATORY_CARE_PROVIDER_SITE_OTHER): Payer: Medicare HMO

## 2018-08-20 ENCOUNTER — Ambulatory Visit: Payer: Self-pay

## 2018-08-20 ENCOUNTER — Encounter: Payer: Self-pay | Admitting: Orthopaedic Surgery

## 2018-08-20 VITALS — Ht 62.0 in | Wt 171.0 lb

## 2018-08-20 DIAGNOSIS — M79641 Pain in right hand: Secondary | ICD-10-CM | POA: Diagnosis not present

## 2018-08-20 DIAGNOSIS — M79642 Pain in left hand: Secondary | ICD-10-CM | POA: Diagnosis not present

## 2018-08-20 DIAGNOSIS — M79661 Pain in right lower leg: Secondary | ICD-10-CM

## 2018-08-20 MED ORDER — BUPIVACAINE HCL 0.25 % IJ SOLN
0.3300 mL | INTRAMUSCULAR | Status: AC | PRN
  Administered 2018-08-20: .33 mL

## 2018-08-20 MED ORDER — METHYLPREDNISOLONE ACETATE 40 MG/ML IJ SUSP
40.0000 mg | INTRAMUSCULAR | Status: AC | PRN
  Administered 2018-08-20: 40 mg

## 2018-08-20 MED ORDER — LIDOCAINE HCL 1 % IJ SOLN
0.3000 mL | INTRAMUSCULAR | Status: AC | PRN
  Administered 2018-08-20: .3 mL
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
## 2018-08-20 NOTE — Telephone Encounter (Signed)
08/20/18 Called and left msg for patient to called - need to tell patient that a list of providers were mailed to her at the request of her therapist Gwenyth Bender)    Is there a way you can print this list from the office and mail it to [place]. They are providers who specialize in trauma and take her insurance. I just want her to have more options to meet with someone more consistently to best meet her needs at this time. I would e-mail them to her, but she does not use her e-mail account. The last I talked with her she was in transition and moving, so you may want to verify her current mailing address. Thank you so much!  Gaylan Gerold  [phone]  Tora Perches [phone]  [place] [phone] [place]  Lorna Few [phone]   Thanks Jeneen Montgomery

## 2018-08-21 ENCOUNTER — Telehealth (INDEPENDENT_AMBULATORY_CARE_PROVIDER_SITE_OTHER): Payer: Medicare HMO | Admitting: Registered Nurse

## 2018-08-21 ENCOUNTER — Other Ambulatory Visit: Payer: Self-pay

## 2018-08-21 DIAGNOSIS — I5032 Chronic diastolic (congestive) heart failure: Secondary | ICD-10-CM

## 2018-08-21 DIAGNOSIS — T7421XA Adult sexual abuse, confirmed, initial encounter: Secondary | ICD-10-CM

## 2018-08-21 DIAGNOSIS — F319 Bipolar disorder, unspecified: Secondary | ICD-10-CM | POA: Diagnosis not present
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
## 2018-08-21 NOTE — Progress Notes (Signed)
Pt is requesting a referral for home aid and needing a wheelchair due to falls. Also having leg swelling and CHF. A call was placed to loving care nursing home, spoke with Lelon Frohlich. She will be faxing the form for completion

## 2018-08-22 DIAGNOSIS — M6281 Muscle weakness (generalized): Secondary | ICD-10-CM | POA: Diagnosis not present

## 2018-08-22 DIAGNOSIS — R062 Wheezing: Secondary | ICD-10-CM | POA: Diagnosis not present

## 2018-08-22 DIAGNOSIS — J45909 Unspecified asthma, uncomplicated: Secondary | ICD-10-CM | POA: Diagnosis not present

## 2018-08-22 DIAGNOSIS — J449 Chronic obstructive pulmonary disease, unspecified: Secondary | ICD-10-CM | POA: Diagnosis not present

## 2018-08-23 ENCOUNTER — Ambulatory Visit (HOSPITAL_COMMUNITY): Payer: Medicare HMO | Admitting: Psychiatry

## 2018-08-23 ENCOUNTER — Telehealth: Payer: Self-pay | Admitting: Registered Nurse

## 2018-08-23 ENCOUNTER — Other Ambulatory Visit: Payer: Self-pay | Admitting: Registered Nurse

## 2018-08-23 NOTE — Telephone Encounter (Signed)
lvm

## 2018-08-23 NOTE — Telephone Encounter (Signed)
Tried to call multiple times - unfortunately was busy each time. I contacted [facility], unfortunately, since she has been discharged from their care, she will need a new assessment from [facility] in order to receive services from [facility] again. She has been in contact with [facility] - should she call back, please let her know that [name] Riggers will need to perform an assessment prior to [facility] resuming their services.  Additionally, there is a list of psychiatry providers available to her in her chart, if she is interested - it also looks like she has been set up with Social work. This will be beneficial for her.  Kathrin Ruddy, NP

## 2018-08-23 NOTE — Telephone Encounter (Signed)
Pt states Tiffany [name] is going to be giving her a call today between 12:30 and 1:00 p.m. Pt called to give the best phone number to reach her at and it is [phone] in Room 103.

## 2018-08-24 DIAGNOSIS — J449 Chronic obstructive pulmonary disease, unspecified: Secondary | ICD-10-CM | POA: Diagnosis not present

## 2018-08-26 ENCOUNTER — Telehealth: Payer: Self-pay | Admitting: Internal Medicine

## 2018-08-26 NOTE — Telephone Encounter (Signed)
Rec'd a call from patient asking to speak with Gonzella Lex, NP.  Patient said she was staying at the [facility] in [address] on [address] in [place], [place] and her number is:  [phone].  Message sent to NP

## 2018-08-27 ENCOUNTER — Other Ambulatory Visit: Payer: Self-pay

## 2018-08-27 ENCOUNTER — Telehealth (INDEPENDENT_AMBULATORY_CARE_PROVIDER_SITE_OTHER): Payer: Medicare HMO | Admitting: Registered Nurse

## 2018-08-27 ENCOUNTER — Ambulatory Visit (INDEPENDENT_AMBULATORY_CARE_PROVIDER_SITE_OTHER): Payer: Medicare HMO | Admitting: Psychiatry

## 2018-08-27 DIAGNOSIS — F5081 Binge eating disorder: Secondary | ICD-10-CM | POA: Diagnosis not present

## 2018-08-27 DIAGNOSIS — F250 Schizoaffective disorder, bipolar type: Secondary | ICD-10-CM

## 2018-08-27 DIAGNOSIS — N3281 Overactive bladder: Secondary | ICD-10-CM | POA: Diagnosis not present

## 2018-08-27 DIAGNOSIS — F4312 Post-traumatic stress disorder, chronic: Secondary | ICD-10-CM

## 2018-08-27 MED ORDER — OXYBUTYNIN CHLORIDE ER 10 MG PO TB24: ORAL_TABLET | ORAL | 3 refills | Status: DC

## 2018-08-27 MED ORDER — LISDEXAMFETAMINE DIMESYLATE 70 MG PO CAPS: 70.0000 mg | ORAL_CAPSULE | Freq: Every day | ORAL | 0 refills | Status: DC

## 2018-08-27 NOTE — Progress Notes (Signed)
Pt needs to follow-up from last appointment about her housing forms and referrals. She also has questions about medications.

## 2018-08-27 NOTE — Progress Notes (Signed)
[facility] MD/PA/NP OP Progress Note  08/27/2018 3:41 PM Tiffany [name]  MRN:  [id number] Interview was conducted by phone and I verified that I was speaking with the correct person using two identifiers. I discussed the limitations of evaluation and management by telemedicine and  the availability of in person appointments. Patient expressed understanding and agreed to proceed.  Chief Complaint: Depression.  HPI: 58yo divorced AAF with schizoaffective disorder bipolar type(vs paranoid schizophrenia),chronic PTSD,remote hx ofcocaineaddiction (clean for 3 years)and binge eating disorder.[name] a former patient of Dr. [name], then Dr. Frederico Hamman at [facility]. He changed her Geodon (she had QTC prolongation on it) to Saphris, mirtazapine was discontinued whereas high dose (40 mg) escitalopram continued and added Vyvanse for binge eating disorder. Tiffany [name] used to weigh over 300 lbs and since starting Vyvanse her binge eating is well controlled and she was able to lose a lot of weight). She continues to take diazepam 10 mg tid and trazodone for sleep (150 mg plus 50 mg in am "for nerves"). She moved to [place] but returned after about two months and is now staying with her mother. [name] very unhappy about this - accuses mother of going though her belongings, stealing from her and she has called police on her few times. She now said that she took changes against her for stealing her propert and will go to court in the end of August. Tiffany [name] moved out of her mother and has been staying in a hotel. She reached out to her cousins/family but nobody wants to let her stay with them at this point. She feels depressed about this situation, feel abandoned and feels that family does not "want me to succeed but I will". Tiffany [name] denies feeling suicidal, no hallucinations reported. [name] compliant with her medications, does not abuse street drugs. She would like to have a regular counselor as Tiffany [name] changed her work  schedule and cannot have sessions with Tiffany [name] often enough.   Visit Diagnosis:    ICD-10-CM   1. Schizoaffective disorder, bipolar type ([place])  F25.0   2. Chronic posttraumatic stress disorder  F43.12   3. Binge eating disorder  F50.81     Past Psychiatric History: Please see intake H&P.  Past Medical History:  Past Medical History:  Diagnosis Date  . Anxiety   . Arthritis    "all over" (04/10/2016)  . Asthma   . Cardiac arrest ([place]) 09/08/2016   PEA  . Carotid artery stenosis    1-39% bilateral by dopplers 11/2016  . Chronic bronchitis ([place])   . Chronic diastolic (congestive) heart failure ([place])   . Chronic kidney disease    "I see a kidney dr." (04/10/2016)  . Cocaine abuse ([place])   . Complication of anesthesia    decreased bp, decreased heart rate  . Depression   . Disorder of nervous system   . Emphysema   . GERD (gastroesophageal reflux disease)   . Heart attack ([place]) 1980s  . History of blood transfusion 1994   "couldn't stop bleeding from my period"  . Hyperlipidemia LDL goal <70   . Hypertension   . Incontinence   . Manic depression ([place])   . On home oxygen therapy    "6L; 24/7" (04/10/2016)  . OSA on CPAP    "wear mask sometimes" (04/10/2016)  . Paranoid ([place])    "sometimes; I'm on RX for it" (04/10/2016)  . Pneumonia    "I've had it several times; haven't had it since 06/2015" (04/10/2016)  .  Schizophrenia ([place])   . Seasonal allergies   . Seizures ([place])    "don't know what kind; last one was ~ 1 yr ago" (04/10/2016)  . Sinus trouble   . Stroke [facility]) 1980s   denies residual on 04/10/2016  . Type II diabetes mellitus ([place])     Past Surgical History:  Procedure Laterality Date  . CESAREAN SECTION  1997  . HERNIA REPAIR    . IR CHOLANGIOGRAM EXISTING TUBE  07/20/2016  . IR PERC CHOLECYSTOSTOMY  05/10/2016  . IR RADIOLOGIST EVAL & MGMT  06/08/2016  . IR RADIOLOGIST EVAL & MGMT  06/29/2016  . IR SINUS/FIST TUBE CHK-NON GI  07/12/2016  . RIGHT/LEFT HEART  CATH AND CORONARY ANGIOGRAPHY N/A 06/19/2017   Procedure: RIGHT/LEFT HEART CATH AND CORONARY ANGIOGRAPHY;  Surgeon: Jolaine Artist, MD;  Location: [facility];  Service: Cardiovascular;  Laterality: N/A;  . TIBIA IM NAIL INSERTION Right 07/12/2016   Procedure: INTRAMEDULLARY (IM) NAIL RIGHT TIBIA;  Surgeon: Leandrew Koyanagi, MD;  Location: [facility];  Service: Orthopedics;  Laterality: Right;  . UMBILICAL HERNIA REPAIR  ~ 1963   "that's why I don't have a belly button"  . VAGINAL HYSTERECTOMY      Family Psychiatric History: Reviewed.  Family History:  Family History  Problem Relation Age of Onset  . Cancer Father        prostate  . Cancer Mother        lung  . Depression Mother   . Depression Sister   . Anxiety disorder Sister   . Schizophrenia Sister   . Bipolar disorder Sister   . Depression Sister   . Depression Brother   . Heart failure Other        cousin    Social History:  Social History   Socioeconomic History  . Marital status: Widowed    Spouse name: Not on file  . Number of children: 3  . Years of education: Not on file  . Highest education level: Not on file  Occupational History  . 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 Financial resource strain: Not on file  . Food insecurity    Worry: Sometimes true    Inability: Never true  . Transportation needs    Medical: No    Non-medical: No  Tobacco Use  . Smoking status: Former Smoker    Packs/day: 1.50    Years: 38.00    Pack years: 57.00    Types: Cigarettes    Start date: 03/13/1977    Quit date: 04/10/2016    Years since quitting: 2.3  . Smokeless tobacco: Never Used  Substance and Sexual Activity  . Alcohol use: No    Alcohol/week: 0.0 standard drinks  . Drug use: No    Types: Cocaine    Comment: 04/10/2016 "last used cocaine back in November 2017"  . Sexual activity: Not Currently    Birth control/protection: Surgical  Lifestyle  . Physical activity    Days  per week: Not on file    Minutes per session: Not on file  . Stress: Not on file  Relationships  . Social Herbalist on phone: Not on file    Gets together: Not on file    Attends religious service: Not on file    Active member of club or organization: Not on file    Attends meetings of clubs or organizations: Not on file    Relationship  status: Not on file  Other Topics Concern  . Not on file  Social History Narrative   Has 1 son, [name]   Lives with son and his boyfriend   Her house has ramps and handrails should she ever needs them.    Her mother lives down the street from her and is a good support person in addition to her son.   She drives herself, has private transportation.    Cocaine free since 02/24/16, smoke free since 04/10/16    Allergies:  Allergies  Allergen Reactions  . Hydrocodone-Acetaminophen Shortness Of Breath  . Hydroxyzine Anaphylaxis and Shortness Of Breath  . Latuda [Lurasidone Hcl] Anaphylaxis  . Magnesium-Containing Compounds Anaphylaxis  . Prednisone Anaphylaxis, Swelling and Other (See Comments)    Tongue swelling  . Tramadol Anaphylaxis  . Codeine Nausea And Vomiting  . Sulfa Antibiotics Itching  . Tape Rash    Metabolic Disorder Labs: Lab Results  Component Value Date   HGBA1C 4.9 08/05/2018   MPG 125.5 01/07/2017   MPG 111.15 11/03/2016   No results found for: PROLACTIN Lab Results  Component Value Date   CHOL 127 08/05/2018   TRIG 53 08/05/2018   HDL 62 08/05/2018   CHOLHDL 2.0 08/05/2018   VLDL 38 01/07/2017   LDLCALC 54 08/05/2018   LDLCALC 56 01/07/2017   Lab Results  Component Value Date   TSH 0.563 03/15/2016   TSH 1.060 12/04/2015    Therapeutic Level Labs: No results found for: LITHIUM Lab Results  Component Value Date   VALPROATE 20 (L) 09/09/2016   VALPROATE 46 (L) 07/10/2016   No components found for:  CBMZ  Current Medications: Current Outpatient Medications  Medication Sig Dispense Refill  .  albuterol (PROVENTIL) (2.5 MG/3ML) 0.083% nebulizer solution Take 3 mLs (2.5 mg total) by nebulization every 6 (six) hours as needed for wheezing or shortness of breath. 150 mL 1  . albuterol (VENTOLIN HFA) 108 (90 Base) MCG/ACT inhaler Inhale 2 puffs into the lungs every 6 (six) hours as needed for wheezing or shortness of breath. 18 g 2  . amLODipine (NORVASC) 5 MG tablet Take 5 mg by mouth daily.    [name] aspirin (GOODSENSE ASPIRIN LOW DOSE) 81 MG EC tablet TAKE 1 TABLET (81 MG TOTAL) BY MOUTH DAILY (MORNING). (Patient taking differently: Take 81 mg by mouth daily. ) 90 tablet 3  . atorvastatin (LIPITOR) 80 MG tablet Take 1 tablet (80 mg total) by mouth daily. 90 tablet 0  . budesonide-formoterol (SYMBICORT) 80-4.5 MCG/ACT inhaler Inhale into the lungs.    . cephALEXin (KEFLEX) 250 MG capsule     . cloNIDine (CATAPRES) 0.2 MG tablet Take by mouth.    . colchicine (COLCRYS) 0.6 MG tablet TAKE 1 TABLET (0.6 MG TOTAL) BY MOUTH 2 (TWO) TIMES DAILY. (Patient taking differently: Take 0.6 mg by mouth 2 (two) times daily. ) 180 tablet 3  . cyclobenzaprine (FLEXERIL) 5 MG tablet     . diazepam (VALIUM) 10 MG tablet Take 1 tablet (10 mg total) by mouth 3 (three) times daily. 270 tablet 0  . diclofenac (VOLTAREN) 75 MG EC tablet Take by mouth.    . diclofenac sodium (VOLTAREN) 1 % GEL Apply 2 g topically 4 (four) times daily. (Patient taking differently: Apply 2 g topically 4 (four) times daily as needed (pain). ) 100 g 0  . docusate sodium (COLACE) 100 MG capsule Take by mouth.    . escitalopram (LEXAPRO) 20 MG tablet Take 2 tablets (40 mg total)  by mouth daily. 180 tablet 0  . febuxostat (ULORIC) 40 MG tablet Take 1 tablet by mouth daily.    . ferrous sulfate (FEROSUL) 325 (65 FE) MG tablet TAKE 1 TABLET BY MOUTH DAILY.(MORNING) (Patient taking differently: Take 325 mg by mouth daily with breakfast. ) 90 tablet 3  . flunisolide (NASALIDE) 25 MCG/ACT (0.025%) SOLN Place 2 sprays into the nose daily. 1 Bottle 2   . fluticasone (FLONASE) 50 MCG/ACT nasal spray Place into the nose.    [name] Fluticasone-Umeclidin-Vilant (TRELEGY ELLIPTA) 100-62.5-25 MCG/INH AEPB Inhale 1 puff into the lungs daily. 60 each 3  . gabapentin (NEURONTIN) 600 MG tablet Take 1 tablet (600 mg total) by mouth 2 (two) times daily. (Patient taking differently: Take 1,200 mg by mouth 3 (three) times daily. ) 360 tablet 0  . glucose blood (PRECISION QID TEST) test strip by Does not apply route.    . hydrochlorothiazide (MICROZIDE) 12.5 MG capsule Take by mouth.    [name] ipratropium (ATROVENT) 0.03 % nasal spray Place into the nose.    . lisdexamfetamine (VYVANSE) 70 MG capsule Take 1 capsule (70 mg total) by mouth daily. 30 capsule 0  . methocarbamol (ROBAXIN) 500 MG tablet Take by mouth.    . metolazone (ZAROXOLYN) 2.5 MG tablet Take 1 tablet (2.5 mg total) by mouth daily. 90 tablet 0  . miconazole (MICONAZOLE 7) 2 % vaginal cream Place 1 Applicatorful vaginally at bedtime. 45 g 0  . montelukast (SINGULAIR) 10 MG tablet Take by mouth.    . montelukast (SINGULAIR) 4 MG chewable tablet Chew by mouth.    . Multiple Vitamin (MULTIVITAMIN WITH MINERALS) TABS tablet Take 1 tablet by mouth daily.    [name] omeprazole (PRILOSEC) 40 MG capsule Take 1 capsule (40 mg total) by mouth 2 (two) times daily. 180 capsule 3  . oxybutynin (DITROPAN-XL) 10 MG 24 hr tablet TAKE 1 TABLET BY MOUTH EVERYDAY AT BEDTIME (Patient taking differently: Take 10 mg by mouth at bedtime. ) 90 tablet 3  . polyethylene glycol (MIRALAX / GLYCOLAX) 17 g packet Take by mouth.    . potassium chloride SA (K-DUR) 20 MEQ tablet Take by mouth.    [name] SAPHRIS 2.5 MG SUBL Place 1 tablet (2.5 mg total) under the tongue 3 (three) times daily. 270 tablet 0  . simvastatin (ZOCOR) 40 MG tablet Take by mouth.    . terconazole (TERAZOL 3) 0.8 % vaginal cream Apply pea sized amount to clitoris nightly. 20 g 0  . torsemide (DEMADEX) 20 MG tablet Take 4 tablets (80 mg total) by mouth 2 (two) times daily.  240 tablet 4  . traZODone (DESYREL) 100 MG tablet Take 2 tablets (200 mg total) by mouth at bedtime. 180 tablet 0  . valACYclovir (VALTREX) 500 MG tablet Take by mouth.    . valsartan (DIOVAN) 80 MG tablet Take by mouth.    . zinc oxide 20 % ointment Apply 1 application topically as needed for irritation. 56.7 g 5   No current facility-administered medications for this visit.     Psychiatric Specialty Exam: Review of Systems  Psychiatric/Behavioral: Positive for depression. The patient is nervous/anxious.   All other systems reviewed and are negative.   There were no vitals taken for this visit.There is no height or weight on file to calculate BMI.  General Appearance: NA  Eye Contact:  NA  Speech:  Clear and Coherent and Normal Rate, Rambling  Volume:  Normal  Mood:  Depressed  Affect:  NA  Thought  Process:  Descriptions of Associations: Circumstantial  Orientation:  Full (Time, Place, and Person)  Thought Content: Rumination   Suicidal Thoughts:  No  Homicidal Thoughts:  No  Memory:  Immediate;   Good Recent;   Good Remote;   Good  Judgement:  Fair  Insight:  Fair  Psychomotor Activity:  NA  Concentration:  Concentration: Fair  Recall:  Good  Fund of Knowledge: Fair  Language: Good  Akathisia:  Negative  Handed:  Right  AIMS (if indicated): not done  Assets:  Desire for Improvement Resilience  ADL's:  Intact  Cognition: WNL  Sleep:  Good   Screenings: AUDIT     Admission (Discharged) from 12/01/2015 in [facility]  Alcohol Use Disorder Identification Test Final Score (AUDIT)  0    GAD-7     Office Visit from 03/21/2018 in [facility] for [facility]  Total GAD-7 Score  4    PHQ2-9     Telemedicine from 08/21/2018 in [facility] from 08/05/2018 in [facility] from 03/21/2018 in [facility] for [facility] Office Visit from 02/07/2018 in [facility]  Office Visit from 12/31/2017 in [facility]  PHQ-2 Total Score  0  4  2  0  0  PHQ-9 Total Score  -  12  2  -  -       Assessment and Plan: 58yo divorced AAF with schizoaffective disorder bipolar type(vs paranoid schizophrenia),chronic PTSD,remote hx ofcocaineaddiction (clean for 3 years)and binge eating disorder.[name] a former patient of Dr. [name], then Dr. Frederico Hamman at [facility]. He changed her Geodon (she had QTC prolongation on it) to Saphris, mirtazapine was discontinued whereas high dose (40 mg) escitalopram continued and added Vyvanse for binge eating disorder. Gennell used to weigh over 300 lbs and since starting Vyvanse her binge eating is well controlled and she was able to lose a lot of weight). She continues to take diazepam 10 mg tid and trazodone for sleep (150 mg plus 50 mg in am "for nerves"). She moved to [place] but returned after about two months and is now staying with her mother. [name] very unhappy about this - accuses mother of going though her belongings, stealing from her and she has called police on her few times. She now said that she took changes against her for stealing her propert and will go to court in the end of August. Chasitee moved out of her mother and has been staying in a hotel. She reached out to her cousins/family but nobody wants to let her stay with them at this point. She feels depressed about this situation, feel abandoned and feels that family does not "want me to succeed but I will". Latrece denies feeling suicidal, no hallucinations reported. [name] compliant with her medications, does not abuse street drugs. She would like to have a regular counselor as Tiffany [name] changed her work schedule and cannot have sessions with Zamariya often enough.   Plan: Continue current meds unchanged: Lexapro, trazodone, Saphris, diazepam and Vyvanse. Next appointment in a month. She would like to have a letter stating that she is  compliant with meds and is trying all we request of her to maintain her mental condition stable while not getting any help from the family. She requests the letter be addressed to "whom it may concern" and be mailed to her. She also has a new cell phone number [phone]  [phone]. The plan was discussed with patient who had an opportunity to ask questions and these were all answered. Supportive psychotherapy was also provided. I spend 40 minutes in phone consultation with the patient.     Stephanie Acre, MD 08/27/2018, 3:41 PM

## 2018-08-28 NOTE — Progress Notes (Signed)
Telemedicine Encounter- SOAP NOTE Established Patient  This telephone encounter was conducted with the patient's (or proxy's) verbal consent via audio telecommunications: yes   Patient was instructed to have this encounter in a suitably private space; and to only have persons present to whom they give permission to participate. In addition, patient identity was confirmed by use of name plus two identifiers (DOB and address).  I discussed the limitations, risks, security and privacy concerns of performing an evaluation and management service by telephone and the availability of in person appointments. I also discussed with the patient that there may be a patient responsible charge related to this service. The patient expressed understanding and agreed to proceed.  I spent a total of 25 minutes  talking with the patient or their proxy.  No chief complaint on file.   Subjective   Tiffany [name] is a 58 y.o. established patient. Telephone visit today for medication questions, refill requests, and incontinence.  HPI Pt has recently moved to hotel, out from her mother's house. While this is a positive change for her, she unfortunately is experiencing issues with her incontinence. [name] wetting the bed multiple times each night, and the hotel has told her that if she cannot control her bladder, she will be asked to leave. She does not know if there's anywhere else for her to go.  [name] requesting a refill of her oxybutynin, which she has run out of. Unfortunately, she used to take higher doses of this medication, but given her advancing CKD, she is limited to one tab daily. Additionally, she missed the call from the urologist referral after she lost her old phone and got a new number. We will resend referrals and have encouraged her to reach out directly to [facility] to facilitate the process of home healthcare.   Patient Active Problem List   Diagnosis Date Noted  . Chronic  posttraumatic stress disorder 05/27/2018  . Schizoaffective disorder, bipolar type ([place]) 04/05/2018  . Closed displaced fracture of fifth metacarpal bone 03/21/2018  . Difficulty with speech 01/24/2018  . Vaginal discharge 01/03/2018  . AKI (acute kidney injury) ([place]) 11/21/2017  . Encephalopathy 11/21/2017  . Drug abuse ([place]) 11/21/2017  . Frequent falls 10/11/2017  . Binge eating disorder   . Dependence on continuous supplemental oxygen 05/14/2017  . Gout 04/11/2017  . Palliative care encounter 03/12/2017  . CKD (chronic kidney disease) stage 3, GFR 30-59 ml/min (HCC) 12/15/2016  . Carotid artery stenosis   . Osteoarthritis 10/26/2016  . Anoxic brain injury ([place]) 09/08/2016  . Overactive bladder 06/07/2016  . QT prolongation   . OSA and COPD overlap syndrome ([place])   . Arthritis   . Essential hypertension 03/22/2016  . Cocaine use disorder, severe, in sustained remission ([place]) 12/17/2015  . History of drug abuse in remission ([employer]) 11/28/2015  . Chronic diastolic congestive heart failure ([place])   . Chronic respiratory failure with hypoxia and hypercapnia ([place]) 06/22/2015  . Tobacco use disorder 07/22/2014  . COPD (chronic obstructive pulmonary disease) ([place]) 07/08/2014  . Seizure ([place]) 01/04/2013  . Chronic pain syndrome 06/18/2012  . Dyslipidemia 04/24/2011  . Anemia 04/24/2011  . Diabetic neuropathy ([place]) 04/24/2011  . Obstructive sleep apnea 10/18/2010  . Asthma 10/18/2010  . Morbid obesity ([place]) 10/18/2010  . Type 2 diabetes mellitus ([place]) 10/18/2010    Past Medical History:  Diagnosis Date  . Anxiety   . Arthritis    "all over" (04/10/2016)  . Asthma   .  Cardiac arrest ([place]) 09/08/2016   PEA  . Carotid artery stenosis    1-39% bilateral by dopplers 11/2016  . Chronic bronchitis ([place])   . Chronic diastolic (congestive) heart failure ([place])   . Chronic kidney disease    "I see a kidney dr." (04/10/2016)  . Cocaine abuse ([place])   . Complication of anesthesia     decreased bp, decreased heart rate  . Depression   . Disorder of nervous system   . Emphysema   . GERD (gastroesophageal reflux disease)   . Heart attack ([place]) 1980s  . History of blood transfusion 1994   "couldn't stop bleeding from my period"  . Hyperlipidemia LDL goal <70   . Hypertension   . Incontinence   . Manic depression ([place])   . On home oxygen therapy    "6L; 24/7" (04/10/2016)  . OSA on CPAP    "wear mask sometimes" (04/10/2016)  . Paranoid ([place])    "sometimes; I'm on RX for it" (04/10/2016)  . Pneumonia    "I've had it several times; haven't had it since 06/2015" (04/10/2016)  . Schizophrenia ([place])   . Seasonal allergies   . Seizures ([place])    "don't know what kind; last one was ~ 1 yr ago" (04/10/2016)  . Sinus trouble   . Stroke [facility]) 1980s   denies residual on 04/10/2016  . Type II diabetes mellitus ([place])     Current Outpatient Medications  Medication Sig Dispense Refill  . albuterol (PROVENTIL) (2.5 MG/3ML) 0.083% nebulizer solution Take 3 mLs (2.5 mg total) by nebulization every 6 (six) hours as needed for wheezing or shortness of breath. 150 mL 1  . albuterol (VENTOLIN HFA) 108 (90 Base) MCG/ACT inhaler Inhale 2 puffs into the lungs every 6 (six) hours as needed for wheezing or shortness of breath. 18 g 2  . amLODipine (NORVASC) 5 MG tablet Take 5 mg by mouth daily.    [name] aspirin (GOODSENSE ASPIRIN LOW DOSE) 81 MG EC tablet TAKE 1 TABLET (81 MG TOTAL) BY MOUTH DAILY (MORNING). (Patient taking differently: Take 81 mg by mouth daily. ) 90 tablet 3  . atorvastatin (LIPITOR) 80 MG tablet Take 1 tablet (80 mg total) by mouth daily. 90 tablet 0  . budesonide-formoterol (SYMBICORT) 80-4.5 MCG/ACT inhaler Inhale into the lungs.    . cephALEXin (KEFLEX) 250 MG capsule     . cloNIDine (CATAPRES) 0.2 MG tablet Take by mouth.    . colchicine (COLCRYS) 0.6 MG tablet TAKE 1 TABLET (0.6 MG TOTAL) BY MOUTH 2 (TWO) TIMES DAILY. (Patient taking differently: Take 0.6 mg by mouth 2 (two)  times daily. ) 180 tablet 3  . cyclobenzaprine (FLEXERIL) 5 MG tablet     . diazepam (VALIUM) 10 MG tablet Take 1 tablet (10 mg total) by mouth 3 (three) times daily. 270 tablet 0  . diclofenac (VOLTAREN) 75 MG EC tablet Take by mouth.    . diclofenac sodium (VOLTAREN) 1 % GEL Apply 2 g topically 4 (four) times daily. (Patient taking differently: Apply 2 g topically 4 (four) times daily as needed (pain). ) 100 g 0  . docusate sodium (COLACE) 100 MG capsule Take by mouth.    . escitalopram (LEXAPRO) 20 MG tablet Take 2 tablets (40 mg total) by mouth daily. 180 tablet 0  . febuxostat (ULORIC) 40 MG tablet Take 1 tablet by mouth daily.    . ferrous sulfate (FEROSUL) 325 (65 FE) MG tablet TAKE 1 TABLET BY MOUTH DAILY.(MORNING) (Patient taking differently: Take  325 mg by mouth daily with breakfast. ) 90 tablet 3  . flunisolide (NASALIDE) 25 MCG/ACT (0.025%) SOLN Place 2 sprays into the nose daily. 1 Bottle 2  . fluticasone (FLONASE) 50 MCG/ACT nasal spray Place into the nose.    [name] Fluticasone-Umeclidin-Vilant (TRELEGY ELLIPTA) 100-62.5-25 MCG/INH AEPB Inhale 1 puff into the lungs daily. 60 each 3  . gabapentin (NEURONTIN) 600 MG tablet Take 1 tablet (600 mg total) by mouth 2 (two) times daily. (Patient taking differently: Take 1,200 mg by mouth 3 (three) times daily. ) 360 tablet 0  . glucose blood (PRECISION QID TEST) test strip by Does not apply route.    . hydrochlorothiazide (MICROZIDE) 12.5 MG capsule Take by mouth.    [name] ipratropium (ATROVENT) 0.03 % nasal spray Place into the nose.    Derrill Memo ON 09/11/2018] lisdexamfetamine (VYVANSE) 70 MG capsule Take 1 capsule (70 mg total) by mouth daily. 30 capsule 0  . methocarbamol (ROBAXIN) 500 MG tablet Take by mouth.    . metolazone (ZAROXOLYN) 2.5 MG tablet Take 1 tablet (2.5 mg total) by mouth daily. 90 tablet 0  . miconazole (MICONAZOLE 7) 2 % vaginal cream Place 1 Applicatorful vaginally at bedtime. 45 g 0  . montelukast (SINGULAIR) 10 MG tablet  Take by mouth.    . montelukast (SINGULAIR) 4 MG chewable tablet Chew by mouth.    . Multiple Vitamin (MULTIVITAMIN WITH MINERALS) TABS tablet Take 1 tablet by mouth daily.    [name] omeprazole (PRILOSEC) 40 MG capsule Take 1 capsule (40 mg total) by mouth 2 (two) times daily. 180 capsule 3  . oxybutynin (DITROPAN-XL) 10 MG 24 hr tablet TAKE 1 TABLET BY MOUTH EVERYDAY AT BEDTIME 90 tablet 3  . polyethylene glycol (MIRALAX / GLYCOLAX) 17 g packet Take by mouth.    . potassium chloride SA (K-DUR) 20 MEQ tablet Take by mouth.    [name] SAPHRIS 2.5 MG SUBL Place 1 tablet (2.5 mg total) under the tongue 3 (three) times daily. 270 tablet 0  . simvastatin (ZOCOR) 40 MG tablet Take by mouth.    . terconazole (TERAZOL 3) 0.8 % vaginal cream Apply pea sized amount to clitoris nightly. 20 g 0  . torsemide (DEMADEX) 20 MG tablet Take 4 tablets (80 mg total) by mouth 2 (two) times daily. 240 tablet 4  . traZODone (DESYREL) 100 MG tablet Take 2 tablets (200 mg total) by mouth at bedtime. 180 tablet 0  . valACYclovir (VALTREX) 500 MG tablet Take by mouth.    . valsartan (DIOVAN) 80 MG tablet Take by mouth.    . zinc oxide 20 % ointment Apply 1 application topically as needed for irritation. 56.7 g 5   No current facility-administered medications for this visit.     Allergies  Allergen Reactions  . Hydrocodone-Acetaminophen Shortness Of Breath  . Hydroxyzine Anaphylaxis and Shortness Of Breath  . Latuda [Lurasidone Hcl] Anaphylaxis  . Magnesium-Containing Compounds Anaphylaxis  . Prednisone Anaphylaxis, Swelling and Other (See Comments)    Tongue swelling  . Tramadol Anaphylaxis  . Codeine Nausea And Vomiting  . Sulfa Antibiotics Itching  . Tape Rash    Social History   Socioeconomic History  . Marital status: Widowed    Spouse name: Not on file  . Number of children: 3  . Years of education: Not on file  . Highest education level: Not on file  Occupational History  . 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 Emergency planning/management officer  strain: Not on file  . Food insecurity    Worry: Sometimes true    Inability: Never true  . Transportation needs    Medical: No    Non-medical: No  Tobacco Use  . Smoking status: Former Smoker    Packs/day: 1.50    Years: 38.00    Pack years: 57.00    Types: Cigarettes    Start date: 03/13/1977    Quit date: 04/10/2016    Years since quitting: 2.3  . Smokeless tobacco: Never Used  Substance and Sexual Activity  . Alcohol use: No    Alcohol/week: 0.0 standard drinks  . Drug use: No    Types: Cocaine    Comment: 04/10/2016 "last used cocaine back in November 2017"  . Sexual activity: Not Currently    Birth control/protection: Surgical  Lifestyle  . Physical activity    Days per week: Not on file    Minutes per session: Not on file  . Stress: Not on file  Relationships  . Social Herbalist on phone: Not on file    Gets together: Not on file    Attends religious service: Not on file    Active member of club or organization: Not on file    Attends meetings of clubs or organizations: Not on file    Relationship status: Not on file  . Intimate partner violence    Fear of current or ex partner: Not on file    Emotionally abused: Not on file    Physically abused: Not on file    Forced sexual activity: Not on file  Other Topics Concern  . Not on file  Social History Narrative   Has 1 son, [name]   Lives with son and his boyfriend   Her house has ramps and handrails should she ever needs them.    Her mother lives down the street from her and is a good support person in addition to her son.   She drives herself, has private transportation.    Cocaine free since 02/24/16, smoke free since 04/10/16    Review of Systems  Constitutional: Negative.   HENT: Negative.   Eyes: Negative.   Respiratory: Positive for shortness of breath and wheezing.        Baseline  Cardiovascular: Positive for chest pain and  palpitations.       Baseline  Gastrointestinal: Negative.   Genitourinary: Positive for frequency and urgency. Negative for dysuria, flank pain and hematuria.  Musculoskeletal: Negative.   Skin: Negative.   Neurological: Negative.   Endo/Heme/Allergies: Negative.   Psychiatric/Behavioral: Positive for depression. Negative for suicidal ideas. The patient is nervous/anxious.   All other systems reviewed and are negative.   Objective   Vitals as reported by the patient: There were no vitals filed for this visit.  Diagnoses and all orders for this visit:  Overactive bladder -     oxybutynin (DITROPAN-XL) 10 MG 24 hr tablet; TAKE 1 TABLET BY MOUTH EVERYDAY AT BEDTIME   PLAN  Refilled oxybutynin. Will discuss resending referrals with updated patient contact information  Will mail patient letter detailing local shelters and services that may be able to help her if she needs somewhere to stay.  Patient encouraged to call clinic with any questions, comments, or concerns.    I discussed the assessment and treatment plan with the patient. The patient was provided an opportunity to ask questions and all were answered. The patient agreed with the plan and demonstrated an understanding  of the instructions.   The patient was advised to call back or seek an in-person evaluation if the symptoms worsen or if the condition fails to improve as anticipated.  I provided 25 minutes of non-face-to-face time during this encounter.  Maximiano Coss, NP  Primary [facility] at [facility]

## 2018-08-30 ENCOUNTER — Other Ambulatory Visit (HOSPITAL_COMMUNITY): Payer: Self-pay

## 2018-08-30 ENCOUNTER — Encounter (HOSPITAL_COMMUNITY): Payer: Self-pay

## 2018-08-30 ENCOUNTER — Telehealth: Payer: Self-pay | Admitting: Registered Nurse

## 2018-08-30 ENCOUNTER — Other Ambulatory Visit: Payer: Self-pay

## 2018-08-30 ENCOUNTER — Ambulatory Visit (INDEPENDENT_AMBULATORY_CARE_PROVIDER_SITE_OTHER): Payer: Medicare HMO | Admitting: Registered Nurse

## 2018-08-30 ENCOUNTER — Encounter: Payer: Self-pay | Admitting: Registered Nurse

## 2018-08-30 VITALS — BP 127/85 | HR 72 | Temp 98.0°F | Resp 16

## 2018-08-30 DIAGNOSIS — R0789 Other chest pain: Secondary | ICD-10-CM | POA: Diagnosis not present

## 2018-08-30 DIAGNOSIS — J9611 Chronic respiratory failure with hypoxia: Secondary | ICD-10-CM | POA: Diagnosis not present

## 2018-08-30 DIAGNOSIS — J449 Chronic obstructive pulmonary disease, unspecified: Secondary | ICD-10-CM | POA: Diagnosis not present

## 2018-08-30 DIAGNOSIS — L299 Pruritus, unspecified: Secondary | ICD-10-CM | POA: Diagnosis not present

## 2018-08-30 DIAGNOSIS — J9612 Chronic respiratory failure with hypercapnia: Secondary | ICD-10-CM | POA: Diagnosis not present

## 2018-08-30 MED ORDER — TACROLIMUS 0.03 % EX OINT: TOPICAL_OINTMENT | Freq: Two times a day (BID) | CUTANEOUS | 0 refills | Status: DC

## 2018-08-30 MED ORDER — CYCLOBENZAPRINE HCL 5 MG PO TABS: 5.0000 mg | ORAL_TABLET | Freq: Three times a day (TID) | ORAL | 0 refills | Status: DC | PRN

## 2018-08-30 NOTE — Telephone Encounter (Signed)
Patient coming into office , will speak with her then

## 2018-08-30 NOTE — Progress Notes (Signed)
Acute Office Visit  Subjective:    Patient ID: Tiffany [name], female    DOB: [birth year], 59 y.o.   MRN: [id number]  Chief Complaint  Patient presents with  . Chronic Care Management    follow-up    HPI Patient is in today for itching on back. Pt also needs refill on cyclobenzaprine  Itching on back has been on and off for "some time". States it is generalized. Has been told she has dry skin previously. Unfortunately, she's had an allergic reaction to prednisone, given her complicated medical history, we won't risk a topical steroid cream at this time.  Past Medical History:  Diagnosis Date  . Anxiety   . Arthritis    "all over" (04/10/2016)  . Asthma   . Cardiac arrest ([place]) 09/08/2016   PEA  . Carotid artery stenosis    1-39% bilateral by dopplers 11/2016  . Chronic bronchitis ([place])   . Chronic diastolic (congestive) heart failure ([place])   . Chronic kidney disease    "I see a kidney dr." (04/10/2016)  . Cocaine abuse ([place])   . Complication of anesthesia    decreased bp, decreased heart rate  . Depression   . Disorder of nervous system   . Emphysema   . GERD (gastroesophageal reflux disease)   . Heart attack ([place]) 1980s  . History of blood transfusion 1994   "couldn't stop bleeding from my period"  . Hyperlipidemia LDL goal <70   . Hypertension   . Incontinence   . Manic depression ([place])   . On home oxygen therapy    "6L; 24/7" (04/10/2016)  . OSA on CPAP    "wear mask sometimes" (04/10/2016)  . Paranoid ([place])    "sometimes; I'm on RX for it" (04/10/2016)  . Pneumonia    "I've had it several times; haven't had it since 06/2015" (04/10/2016)  . Schizophrenia ([place])   . Seasonal allergies   . Seizures ([place])    "don't know what kind; last one was ~ 1 yr ago" (04/10/2016)  . Sinus trouble   . Stroke [facility]) 1980s   denies residual on 04/10/2016  . Type II diabetes mellitus ([place])     Past Surgical History:  Procedure Laterality Date  . CESAREAN SECTION  1997  .  HERNIA REPAIR    . IR CHOLANGIOGRAM EXISTING TUBE  07/20/2016  . IR PERC CHOLECYSTOSTOMY  05/10/2016  . IR RADIOLOGIST EVAL & MGMT  06/08/2016  . IR RADIOLOGIST EVAL & MGMT  06/29/2016  . IR SINUS/FIST TUBE CHK-NON GI  07/12/2016  . RIGHT/LEFT HEART CATH AND CORONARY ANGIOGRAPHY N/A 06/19/2017   Procedure: RIGHT/LEFT HEART CATH AND CORONARY ANGIOGRAPHY;  Surgeon: Jolaine Artist, MD;  Location: [facility];  Service: Cardiovascular;  Laterality: N/A;  . TIBIA IM NAIL INSERTION Right 07/12/2016   Procedure: INTRAMEDULLARY (IM) NAIL RIGHT TIBIA;  Surgeon: Leandrew Koyanagi, MD;  Location: [facility];  Service: Orthopedics;  Laterality: Right;  . UMBILICAL HERNIA REPAIR  ~ 1963   "that's why I don't have a belly button"  . VAGINAL HYSTERECTOMY      Family History  Problem Relation Age of Onset  . Cancer Father        prostate  . Cancer Mother        lung  . Depression Mother   . Depression Sister   . Anxiety disorder Sister   . Schizophrenia Sister   . Bipolar disorder Sister   . Depression Sister   .  Depression Brother   . Heart failure Other        cousin    Social History   Socioeconomic History  . Marital status: Widowed    Spouse name: Not on file  . Number of children: 3  . Years of education: Not on file  . Highest education level: Not on file  Occupational History  . 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 Financial resource strain: Not on file  . Food insecurity    Worry: Sometimes true    Inability: Never true  . Transportation needs    Medical: No    Non-medical: No  Tobacco Use  . Smoking status: Former Smoker    Packs/day: 1.50    Years: 38.00    Pack years: 57.00    Types: Cigarettes    Start date: 03/13/1977    Quit date: 04/10/2016    Years since quitting: 2.3  . Smokeless tobacco: Never Used  Substance and Sexual Activity  . Alcohol use: No    Alcohol/week: 0.0 standard drinks  . Drug use: No    Types: Cocaine     Comment: 04/10/2016 "last used cocaine back in November 2017"  . Sexual activity: Not Currently    Birth control/protection: Surgical  Lifestyle  . Physical activity    Days per week: Not on file    Minutes per session: Not on file  . Stress: Not on file  Relationships  . Social Herbalist on phone: Not on file    Gets together: Not on file    Attends religious service: Not on file    Active member of club or organization: Not on file    Attends meetings of clubs or organizations: Not on file    Relationship status: Not on file  . Intimate partner violence    Fear of current or ex partner: Not on file    Emotionally abused: Not on file    Physically abused: Not on file    Forced sexual activity: Not on file  Other Topics Concern  . Not on file  Social History Narrative   Has 1 son, [name]   Lives with son and his boyfriend   Her house has ramps and handrails should she ever needs them.    Her mother lives down the street from her and is a good support person in addition to her son.   She drives herself, has private transportation.    Cocaine free since 02/24/16, smoke free since 04/10/16    Outpatient Medications Prior to Visit  Medication Sig Dispense Refill  . albuterol (PROVENTIL) (2.5 MG/3ML) 0.083% nebulizer solution Take 3 mLs (2.5 mg total) by nebulization every 6 (six) hours as needed for wheezing or shortness of breath. 150 mL 1  . albuterol (VENTOLIN HFA) 108 (90 Base) MCG/ACT inhaler Inhale 2 puffs into the lungs every 6 (six) hours as needed for wheezing or shortness of breath. 18 g 2  . amLODipine (NORVASC) 5 MG tablet Take 5 mg by mouth daily.    [name] aspirin (GOODSENSE ASPIRIN LOW DOSE) 81 MG EC tablet TAKE 1 TABLET (81 MG TOTAL) BY MOUTH DAILY (MORNING). (Patient taking differently: Take 81 mg by mouth daily. ) 90 tablet 3  . atorvastatin (LIPITOR) 80 MG tablet Take 1 tablet (80 mg total) by mouth daily. 90 tablet 0  . budesonide-formoterol (SYMBICORT) 80-4.5  MCG/ACT inhaler Inhale into the lungs.    . cephALEXin (KEFLEX)  250 MG capsule     . cloNIDine (CATAPRES) 0.2 MG tablet Take by mouth.    . colchicine (COLCRYS) 0.6 MG tablet TAKE 1 TABLET (0.6 MG TOTAL) BY MOUTH 2 (TWO) TIMES DAILY. (Patient taking differently: Take 0.6 mg by mouth 2 (two) times daily. ) 180 tablet 3  . diazepam (VALIUM) 10 MG tablet Take 1 tablet (10 mg total) by mouth 3 (three) times daily. 270 tablet 0  . diclofenac (VOLTAREN) 75 MG EC tablet Take by mouth.    . diclofenac sodium (VOLTAREN) 1 % GEL Apply 2 g topically 4 (four) times daily. (Patient taking differently: Apply 2 g topically 4 (four) times daily as needed (pain). ) 100 g 0  . docusate sodium (COLACE) 100 MG capsule Take by mouth.    . escitalopram (LEXAPRO) 20 MG tablet Take 2 tablets (40 mg total) by mouth daily. 180 tablet 0  . febuxostat (ULORIC) 40 MG tablet Take 1 tablet by mouth daily.    . ferrous sulfate (FEROSUL) 325 (65 FE) MG tablet TAKE 1 TABLET BY MOUTH DAILY.(MORNING) (Patient taking differently: Take 325 mg by mouth daily with breakfast. ) 90 tablet 3  . flunisolide (NASALIDE) 25 MCG/ACT (0.025%) SOLN Place 2 sprays into the nose daily. 1 Bottle 2  . fluticasone (FLONASE) 50 MCG/ACT nasal spray Place into the nose.    [name] Fluticasone-Umeclidin-Vilant (TRELEGY ELLIPTA) 100-62.5-25 MCG/INH AEPB Inhale 1 puff into the lungs daily. 60 each 3  . gabapentin (NEURONTIN) 600 MG tablet Take 1 tablet (600 mg total) by mouth 2 (two) times daily. (Patient taking differently: Take 1,200 mg by mouth 3 (three) times daily. ) 360 tablet 0  . glucose blood (PRECISION QID TEST) test strip by Does not apply route.    . hydrochlorothiazide (MICROZIDE) 12.5 MG capsule Take by mouth.    [name] ipratropium (ATROVENT) 0.03 % nasal spray Place into the nose.    Derrill Memo ON 09/11/2018] lisdexamfetamine (VYVANSE) 70 MG capsule Take 1 capsule (70 mg total) by mouth daily. 30 capsule 0  . methocarbamol (ROBAXIN) 500 MG tablet Take by  mouth.    . metolazone (ZAROXOLYN) 2.5 MG tablet Take 1 tablet (2.5 mg total) by mouth daily. 90 tablet 0  . miconazole (MICONAZOLE 7) 2 % vaginal cream Place 1 Applicatorful vaginally at bedtime. 45 g 0  . montelukast (SINGULAIR) 10 MG tablet Take by mouth.    . montelukast (SINGULAIR) 4 MG chewable tablet Chew by mouth.    . Multiple Vitamin (MULTIVITAMIN WITH MINERALS) TABS tablet Take 1 tablet by mouth daily.    [name] omeprazole (PRILOSEC) 40 MG capsule Take 1 capsule (40 mg total) by mouth 2 (two) times daily. 180 capsule 3  . oxybutynin (DITROPAN-XL) 10 MG 24 hr tablet TAKE 1 TABLET BY MOUTH EVERYDAY AT BEDTIME 90 tablet 3  . polyethylene glycol (MIRALAX / GLYCOLAX) 17 g packet Take by mouth.    . potassium chloride SA (K-DUR) 20 MEQ tablet Take by mouth.    [name] SAPHRIS 2.5 MG SUBL Place 1 tablet (2.5 mg total) under the tongue 3 (three) times daily. 270 tablet 0  . simvastatin (ZOCOR) 40 MG tablet Take by mouth.    . terconazole (TERAZOL 3) 0.8 % vaginal cream Apply pea sized amount to clitoris nightly. 20 g 0  . torsemide (DEMADEX) 20 MG tablet Take 4 tablets (80 mg total) by mouth 2 (two) times daily. 240 tablet 4  . traZODone (DESYREL) 100 MG tablet Take 2 tablets (200 mg total)  by mouth at bedtime. 180 tablet 0  . valACYclovir (VALTREX) 500 MG tablet Take by mouth.    . valsartan (DIOVAN) 80 MG tablet Take by mouth.    . zinc oxide 20 % ointment Apply 1 application topically as needed for irritation. 56.7 g 5  . cyclobenzaprine (FLEXERIL) 5 MG tablet      No facility-administered medications prior to visit.     Allergies  Allergen Reactions  . Hydrocodone-Acetaminophen Shortness Of Breath  . Hydroxyzine Anaphylaxis and Shortness Of Breath  . Latuda [Lurasidone Hcl] Anaphylaxis  . Magnesium-Containing Compounds Anaphylaxis  . Prednisone Anaphylaxis, Swelling and Other (See Comments)    Tongue swelling  . Tramadol Anaphylaxis  . Codeine Nausea And Vomiting  . Sulfa Antibiotics  Itching  . Tape Rash    Review of Systems  Constitutional: Negative.   HENT: Negative.   Eyes: Negative.   Respiratory: Negative.   Cardiovascular: Negative.   Gastrointestinal: Negative.   Genitourinary: Negative.   Musculoskeletal: Negative.   Skin: Positive for itching and rash.  Neurological: Negative.   Endo/Heme/Allergies: Negative.   Psychiatric/Behavioral: Negative.   All other systems reviewed and are negative.      Objective:    Physical Exam  Constitutional: [name] oriented to person, place, and time. She appears well-developed and well-nourished. No distress.  Cardiovascular: Normal rate and regular rhythm.  Pulmonary/Chest: [facility] normal. No respiratory distress.  Neurological: [name] alert and oriented to person, place, and time.  Skin: Skin is warm and dry. No rash noted. [name] not diaphoretic. No erythema. No pallor.  No rash noted across back  Psychiatric: She has a normal mood and affect. Her behavior is normal. Judgment normal.    BP 127/85   Pulse 72   Temp 98 F (36.7 C) (Oral)   Resp 16   SpO2 97%  Wt Readings from Last 3 Encounters:  08/20/18 171 lb (77.6 kg)  08/05/18 171 lb (77.6 kg)  03/25/18 159 lb (72.1 kg)    Health Maintenance Due  Topic Date Due  . COLONOSCOPY  05/10/2009  . FOOT EXAM  03/12/2018  . OPHTHALMOLOGY EXAM  04/20/2018  . INFLUENZA VACCINE  08/17/2018    There are no preventive care reminders to display for this patient.   Lab Results  Component Value Date   TSH 0.563 03/15/2016   Lab Results  Component Value Date   WBC 5.4 08/05/2018   HGB 8.6 (L) 08/05/2018   HCT 26.9 (L) 08/05/2018   MCV 89 08/05/2018   PLT 362 08/05/2018   Lab Results  Component Value Date   NA 141 08/05/2018   K 4.0 08/05/2018   CO2 25 08/05/2018   GLUCOSE 79 08/05/2018   BUN 19 08/05/2018   CREATININE 1.27 (H) 08/05/2018   BILITOT <0.2 08/05/2018   ALKPHOS 110 08/05/2018   AST 11 08/05/2018   ALT 6 08/05/2018   PROT 5.9  (L) 08/05/2018   ALBUMIN 3.8 08/05/2018   CALCIUM 9.3 08/05/2018   ANIONGAP 10 02/27/2018   Lab Results  Component Value Date   CHOL 127 08/05/2018   Lab Results  Component Value Date   HDL 62 08/05/2018   Lab Results  Component Value Date   LDLCALC 54 08/05/2018   Lab Results  Component Value Date   TRIG 53 08/05/2018   Lab Results  Component Value Date   CHOLHDL 2.0 08/05/2018   Lab Results  Component Value Date   HGBA1C 4.9 08/05/2018  Assessment & Plan:   Problem List Items Addressed This Visit    None    Visit Diagnoses    Left-sided chest wall pain    -  Primary   Relevant Medications   cyclobenzaprine (FLEXERIL) 5 MG tablet   Pruritus       Relevant Medications   tacrolimus (PROTOPIC) 0.03 % ointment       Meds ordered this encounter  Medications  . cyclobenzaprine (FLEXERIL) 5 MG tablet    Sig: Take 1 tablet (5 mg total) by mouth 3 (three) times daily as needed for muscle spasms.    Dispense:  60 tablet    Refill:  0    Order Specific Question:   Supervising Provider    Answer:   Delia Chimes A [id number]  . tacrolimus (PROTOPIC) 0.03 % ointment    Sig: Apply topically 2 (two) times daily.    Dispense:  100 g    Refill:  0    Order Specific Question:   Supervising Provider    Answer:   Forrest Moron [[id number]]   PLAN  Tacrolimus topical for itch on back  Refill low dose of cyclobenzaprine  Will continue to follow  Referrals are being resent by Sherron at this time, we are hoping that we have settled her address and phone information.   Patient encouraged to call clinic with any questions, comments, or concerns.    Maximiano Coss, NP

## 2018-08-30 NOTE — Patient Instructions (Signed)
° ° ° °  If you have lab work done today you will be contacted with your lab results within the next 2 weeks.  If you have not heard from us then please contact us. The fastest way to get your results is to register for My Chart. ° ° °IF you received an x-ray today, you will receive an invoice from [facility]. Please contact [facility] at [phone] with questions or concerns regarding your invoice.  ° °IF you received labwork today, you will receive an invoice from [facility]. Please contact [facility] at [phone] with questions or concerns regarding your invoice.  ° °Our billing staff will not be able to assist you with questions regarding bills from these companies. ° °You will be contacted with the lab results as soon as they are available. The fastest way to get your results is to activate your My Chart account. Instructions are located on the last page of this paperwork. If you have not heard from us regarding the results in 2 weeks, please contact this office. °  ° ° ° °

## 2018-09-02 ENCOUNTER — Other Ambulatory Visit: Payer: Medicare HMO | Admitting: Internal Medicine

## 2018-09-02 ENCOUNTER — Other Ambulatory Visit: Payer: Self-pay

## 2018-09-02 ENCOUNTER — Telehealth: Payer: Self-pay | Admitting: Registered Nurse

## 2018-09-02 ENCOUNTER — Ambulatory Visit: Payer: Medicare HMO | Admitting: Emergency Medicine

## 2018-09-02 DIAGNOSIS — Z515 Encounter for palliative care: Secondary | ICD-10-CM

## 2018-09-02 NOTE — Telephone Encounter (Signed)
Called pt to verify mesg left, busy signal

## 2018-09-02 NOTE — Telephone Encounter (Signed)
Pt is wanting a 4 pills a day of oxybutynin (DITROPAN-XL) 10 MG 24 hr tablet [[id number]].  [name] not happy with the script for one pill a day.Pharmacy:  [id number] - [place], [address]. Please advise at [phone] room [phone]

## 2018-09-02 NOTE — Progress Notes (Unsigned)
[facility] Consult Note Telephone: [phone]  Fax: [phone]  PATIENT NAME: Tiffany [name] DOB: 08-24-1960 MRN: [id number]  PRIMARY CARE PROVIDER:   Maximiano Coss, NP  REFERRING PROVIDER:  Maximiano Coss, NP [place],  [place] [address]  RESPONSIBLE PARTY:     ASSESSMENT:        RECOMMENDATIONS and PLAN:  1.  I spent *** minutes providing this consultation,  from *** to ***. More than 50% of the time in this consultation was spent coordinating communication.   HISTORY OF PRESENT ILLNESS:  Tiffany [name] is a 58 y.o. year old female with multiple medical problems including ***. Palliative Care was asked to help address goals of care.   CODE STATUS:   PPS: 0% HOSPICE ELIGIBILITY/DIAGNOSIS: TBD  PAST MEDICAL HISTORY:  Past Medical History:  Diagnosis Date  . Anxiety   . Arthritis    "all over" (04/10/2016)  . Asthma   . Cardiac arrest ([place]) 09/08/2016   PEA  . Carotid artery stenosis    1-39% bilateral by dopplers 11/2016  . Chronic bronchitis ([place])   . Chronic diastolic (congestive) heart failure ([place])   . Chronic kidney disease    "I see a kidney dr." (04/10/2016)  . Cocaine abuse ([place])   . Complication of anesthesia    decreased bp, decreased heart rate  . Depression   . Disorder of nervous system   . Emphysema   . GERD (gastroesophageal reflux disease)   . Heart attack ([place]) 1980s  . History of blood transfusion 1994   "couldn't stop bleeding from my period"  . Hyperlipidemia LDL goal <70   . Hypertension   . Incontinence   . Manic depression ([place])   . On home oxygen therapy    "6L; 24/7" (04/10/2016)  . OSA on CPAP    "wear mask sometimes" (04/10/2016)  . Paranoid ([place])    "sometimes; I'm on RX for it" (04/10/2016)  . Pneumonia    "I've had it several times; haven't had it since 06/2015" (04/10/2016)  . Schizophrenia ([place])   . Seasonal allergies   . Seizures ([place])    "don't know what  kind; last one was ~ 1 yr ago" (04/10/2016)  . Sinus trouble   . Stroke [facility]) 1980s   denies residual on 04/10/2016  . Type II diabetes mellitus ([place])     SOCIAL HX:  Social History   Tobacco Use  . Smoking status: Former Smoker    Packs/day: 1.50    Years: 38.00    Pack years: 57.00    Types: Cigarettes    Start date: 03/13/1977    Quit date: 04/10/2016    Years since quitting: 2.3  . Smokeless tobacco: Never Used  Substance Use Topics  . Alcohol use: No    Alcohol/week: 0.0 standard drinks    ALLERGIES:  Allergies  Allergen Reactions  . Hydrocodone-Acetaminophen Shortness Of Breath  . Hydroxyzine Anaphylaxis and Shortness Of Breath  . Latuda [Lurasidone Hcl] Anaphylaxis  . Magnesium-Containing Compounds Anaphylaxis  . Prednisone Anaphylaxis, Swelling and Other (See Comments)    Tongue swelling  . Tramadol Anaphylaxis  . Codeine Nausea And Vomiting  . Sulfa Antibiotics Itching  . Tape Rash     PERTINENT MEDICATIONS:  Outpatient Encounter Medications as of 09/02/2018  Medication Sig  . albuterol (PROVENTIL) (2.5 MG/3ML) 0.083% nebulizer solution Take 3 mLs (2.5 mg total) by nebulization every 6 (six) hours as needed for wheezing or shortness  of breath.  [name] albuterol (VENTOLIN HFA) 108 (90 Base) MCG/ACT inhaler Inhale 2 puffs into the lungs every 6 (six) hours as needed for wheezing or shortness of breath.  [name] amLODipine (NORVASC) 5 MG tablet Take 5 mg by mouth daily.  [name] aspirin (GOODSENSE ASPIRIN LOW DOSE) 81 MG EC tablet TAKE 1 TABLET (81 MG TOTAL) BY MOUTH DAILY (MORNING). (Patient taking differently: Take 81 mg by mouth daily. )  . atorvastatin (LIPITOR) 80 MG tablet Take 1 tablet (80 mg total) by mouth daily.  . budesonide-formoterol (SYMBICORT) 80-4.5 MCG/ACT inhaler Inhale into the lungs.  . cephALEXin (KEFLEX) 250 MG capsule   . cloNIDine (CATAPRES) 0.2 MG tablet Take by mouth.  . colchicine (COLCRYS) 0.6 MG tablet TAKE 1 TABLET (0.6 MG TOTAL) BY MOUTH 2 (TWO) TIMES  DAILY. (Patient taking differently: Take 0.6 mg by mouth 2 (two) times daily. )  . cyclobenzaprine (FLEXERIL) 5 MG tablet Take 1 tablet (5 mg total) by mouth 3 (three) times daily as needed for muscle spasms.  . diazepam (VALIUM) 10 MG tablet Take 1 tablet (10 mg total) by mouth 3 (three) times daily.  . diclofenac (VOLTAREN) 75 MG EC tablet Take by mouth.  . diclofenac sodium (VOLTAREN) 1 % GEL Apply 2 g topically 4 (four) times daily. (Patient taking differently: Apply 2 g topically 4 (four) times daily as needed (pain). )  . docusate sodium (COLACE) 100 MG capsule Take by mouth.  . escitalopram (LEXAPRO) 20 MG tablet Take 2 tablets (40 mg total) by mouth daily.  . febuxostat (ULORIC) 40 MG tablet Take 1 tablet by mouth daily.  . ferrous sulfate (FEROSUL) 325 (65 FE) MG tablet TAKE 1 TABLET BY MOUTH DAILY.(MORNING) (Patient taking differently: Take 325 mg by mouth daily with breakfast. )  . flunisolide (NASALIDE) 25 MCG/ACT (0.025%) SOLN Place 2 sprays into the nose daily.  . fluticasone (FLONASE) 50 MCG/ACT nasal spray Place into the nose.  [name] Fluticasone-Umeclidin-Vilant (TRELEGY ELLIPTA) 100-62.5-25 MCG/INH AEPB Inhale 1 puff into the lungs daily.  [name] gabapentin (NEURONTIN) 600 MG tablet Take 1 tablet (600 mg total) by mouth 2 (two) times daily. (Patient taking differently: Take 1,200 mg by mouth 3 (three) times daily. )  . glucose blood (PRECISION QID TEST) test strip by Does not apply route.  . hydrochlorothiazide (MICROZIDE) 12.5 MG capsule Take by mouth.  [name] ipratropium (ATROVENT) 0.03 % nasal spray Place into the nose.  Derrill Memo ON 09/11/2018] lisdexamfetamine (VYVANSE) 70 MG capsule Take 1 capsule (70 mg total) by mouth daily.  . methocarbamol (ROBAXIN) 500 MG tablet Take by mouth.  . metolazone (ZAROXOLYN) 2.5 MG tablet Take 1 tablet (2.5 mg total) by mouth daily.  . miconazole (MICONAZOLE 7) 2 % vaginal cream Place 1 Applicatorful vaginally at bedtime.  . montelukast (SINGULAIR) 10 MG  tablet Take by mouth.  . montelukast (SINGULAIR) 4 MG chewable tablet Chew by mouth.  . Multiple Vitamin (MULTIVITAMIN WITH MINERALS) TABS tablet Take 1 tablet by mouth daily.  [name] omeprazole (PRILOSEC) 40 MG capsule Take 1 capsule (40 mg total) by mouth 2 (two) times daily.  [name] oxybutynin (DITROPAN-XL) 10 MG 24 hr tablet TAKE 1 TABLET BY MOUTH EVERYDAY AT BEDTIME  . polyethylene glycol (MIRALAX / GLYCOLAX) 17 g packet Take by mouth.  . potassium chloride SA (K-DUR) 20 MEQ tablet Take by mouth.  [name] SAPHRIS 2.5 MG SUBL Place 1 tablet (2.5 mg total) under the tongue 3 (three) times daily.  . simvastatin (ZOCOR) 40 MG tablet Take  by mouth.  . tacrolimus (PROTOPIC) 0.03 % ointment Apply topically 2 (two) times daily.  [name] terconazole (TERAZOL 3) 0.8 % vaginal cream Apply pea sized amount to clitoris nightly.  . torsemide (DEMADEX) 20 MG tablet Take 4 tablets (80 mg total) by mouth 2 (two) times daily.  . traZODone (DESYREL) 100 MG tablet Take 2 tablets (200 mg total) by mouth at bedtime.  . valACYclovir (VALTREX) 500 MG tablet Take by mouth.  . valsartan (DIOVAN) 80 MG tablet Take by mouth.  . zinc oxide 20 % ointment Apply 1 application topically as needed for irritation.   No facility-administered encounter medications on file as of 09/02/2018.     PHYSICAL EXAM:   General: NAD, frail appearing, thin Cardiovascular: regular rate and rhythm Pulmonary: clear ant fields Abdomen: soft, nontender, + bowel sounds GU: no suprapubic tenderness Extremities: no edema, no joint deformities Skin: no rashes Neurological: Weakness but otherwise nonfocal  Gonzella Lex, NP

## 2018-09-03 ENCOUNTER — Ambulatory Visit: Payer: Medicare HMO | Admitting: Orthopaedic Surgery

## 2018-09-03 ENCOUNTER — Other Ambulatory Visit: Payer: Medicare HMO | Admitting: Internal Medicine

## 2018-09-03 ENCOUNTER — Telehealth: Payer: Self-pay | Admitting: Registered Nurse

## 2018-09-03 NOTE — Telephone Encounter (Signed)
Pt wants someone to call her at [phone] room #[phone] regarding her medications. Still peeing in the bed again .

## 2018-09-03 NOTE — Telephone Encounter (Signed)
Form was completed and sent.

## 2018-09-03 NOTE — Telephone Encounter (Signed)
Spoke to [employer] and adv that [facility] would not let me update Tiffany [name] information

## 2018-09-03 NOTE — Progress Notes (Signed)
NOTE:  Arrived at [facility] for a pre-scheduled appointment with patient.  She was no longer residing at this location and there is no forwarding information.  No opertional telephone number located.  We will attempt re-contact of patient.  I also expect that patient will call Palliative care to inform our department of her location.   Gonzella Lex, NP-C

## 2018-09-03 NOTE — Telephone Encounter (Signed)
Patient called back and is waiting for a call regarding her med refills discussed during last visit

## 2018-09-04 ENCOUNTER — Other Ambulatory Visit: Payer: Self-pay

## 2018-09-04 ENCOUNTER — Ambulatory Visit (HOSPITAL_COMMUNITY): Payer: Medicare HMO | Admitting: Psychiatry

## 2018-09-04 ENCOUNTER — Telehealth: Payer: Self-pay | Admitting: Registered Nurse

## 2018-09-04 NOTE — Telephone Encounter (Signed)
Era Skeen, NP called to discuss information about the patient. CB is [phone]. King requested to speak with Orland Mustard or his Environmental consultant.

## 2018-09-04 NOTE — Telephone Encounter (Signed)
Pt called in and verified her working number is [phone]. She called in regards to med refill

## 2018-09-05 ENCOUNTER — Other Ambulatory Visit: Payer: Self-pay | Admitting: Emergency Medicine

## 2018-09-05 ENCOUNTER — Telehealth: Payer: Self-pay | Admitting: Registered Nurse

## 2018-09-05 ENCOUNTER — Telehealth: Payer: Self-pay

## 2018-09-05 NOTE — Telephone Encounter (Signed)
Received phone call from Ruffin Frederick who requested to speak with Palliative NP, Enid Derry. Will relay information to NP

## 2018-09-05 NOTE — Telephone Encounter (Signed)
SHIRLEY CATES NP FROM [facility] AND PALLIATIVE CARE  [phone]  NEEDS TO DISCUSS THIS PATIENT WITH YOU ASAP ,

## 2018-09-06 ENCOUNTER — Other Ambulatory Visit: Payer: Self-pay

## 2018-09-06 ENCOUNTER — Other Ambulatory Visit: Payer: Medicare HMO

## 2018-09-06 ENCOUNTER — Other Ambulatory Visit: Payer: Self-pay | Admitting: Registered Nurse

## 2018-09-06 ENCOUNTER — Other Ambulatory Visit: Payer: Medicare HMO | Admitting: Licensed Clinical Social Worker

## 2018-09-06 DIAGNOSIS — N3281 Overactive bladder: Secondary | ICD-10-CM

## 2018-09-06 DIAGNOSIS — Z515 Encounter for palliative care: Secondary | ICD-10-CM

## 2018-09-06 NOTE — Telephone Encounter (Signed)
Returned Tiffany. Huston [name]' call. She was hoping to discuss Tiffany [name]'s living situation - unfortunately she is running out of funds to stay at the hotel she is currently at.  We share ongoing concerns for Tiffany [name]'s medical and mental health. We are discussing options to prevent homelessness at this time - considering psych admission to control her paranoia and schizoaffective disorder, using this as a bridge to admission to an SNF.  She would benefit from living in a nursing facility given her complex medical history, but unfortunately, we've seen her paranoia lead to increasingly unstable living situations, as such, addressing her mental health is paramount at this time.  Thank you for keeping me in the loop with this patient, Tiffany [name] and I will continue to stay in contact as this situation unfolds.  Kathrin Ruddy, NP

## 2018-09-07 ENCOUNTER — Other Ambulatory Visit (HOSPITAL_COMMUNITY): Admission: RE | Admit: 2018-09-07 | Payer: Medicare HMO | Source: Ambulatory Visit

## 2018-09-09 ENCOUNTER — Other Ambulatory Visit: Payer: Self-pay

## 2018-09-09 ENCOUNTER — Telehealth (HOSPITAL_COMMUNITY): Payer: Self-pay | Admitting: Psychiatry

## 2018-09-09 ENCOUNTER — Emergency Department (HOSPITAL_COMMUNITY)
Admission: EM | Admit: 2018-09-09 | Discharge: 2018-09-09 | Discharge status: Against Medical Advice | Payer: Medicare HMO

## 2018-09-09 ENCOUNTER — Emergency Department (HOSPITAL_COMMUNITY)
Admission: EM | Admit: 2018-09-09 | Discharge: 2018-09-10 | Disposition: A | Discharge status: Home / Self Care | Payer: Medicare HMO | Attending: Emergency Medicine | Admitting: Emergency Medicine

## 2018-09-09 ENCOUNTER — Telehealth: Payer: Self-pay

## 2018-09-09 ENCOUNTER — Encounter (HOSPITAL_COMMUNITY): Payer: Self-pay | Admitting: Emergency Medicine

## 2018-09-09 DIAGNOSIS — R600 Localized edema: Secondary | ICD-10-CM | POA: Diagnosis not present

## 2018-09-09 DIAGNOSIS — I5032 Chronic diastolic (congestive) heart failure: Secondary | ICD-10-CM | POA: Insufficient documentation

## 2018-09-09 DIAGNOSIS — I130 Hypertensive heart and chronic kidney disease with heart failure and stage 1 through stage 4 chronic kidney disease, or unspecified chronic kidney disease: Secondary | ICD-10-CM | POA: Insufficient documentation

## 2018-09-09 DIAGNOSIS — Z5900 Homelessness unspecified: Secondary | ICD-10-CM

## 2018-09-09 DIAGNOSIS — F209 Schizophrenia, unspecified: Secondary | ICD-10-CM | POA: Insufficient documentation

## 2018-09-09 DIAGNOSIS — N183 Chronic kidney disease, stage 3 (moderate): Secondary | ICD-10-CM | POA: Insufficient documentation

## 2018-09-09 DIAGNOSIS — R5383 Other fatigue: Secondary | ICD-10-CM | POA: Diagnosis not present

## 2018-09-09 DIAGNOSIS — E1122 Type 2 diabetes mellitus with diabetic chronic kidney disease: Secondary | ICD-10-CM | POA: Diagnosis not present

## 2018-09-09 DIAGNOSIS — Z79899 Other long term (current) drug therapy: Secondary | ICD-10-CM | POA: Insufficient documentation

## 2018-09-09 DIAGNOSIS — F99 Mental disorder, not otherwise specified: Secondary | ICD-10-CM | POA: Diagnosis not present

## 2018-09-09 DIAGNOSIS — Z87891 Personal history of nicotine dependence: Secondary | ICD-10-CM | POA: Insufficient documentation

## 2018-09-09 DIAGNOSIS — J449 Chronic obstructive pulmonary disease, unspecified: Secondary | ICD-10-CM | POA: Insufficient documentation

## 2018-09-09 DIAGNOSIS — Z8673 Personal history of transient ischemic attack (TIA), and cerebral infarction without residual deficits: Secondary | ICD-10-CM | POA: Insufficient documentation

## 2018-09-09 DIAGNOSIS — M7918 Myalgia, other site: Secondary | ICD-10-CM | POA: Insufficient documentation

## 2018-09-09 DIAGNOSIS — R5381 Other malaise: Secondary | ICD-10-CM | POA: Diagnosis not present

## 2018-09-09 DIAGNOSIS — F419 Anxiety disorder, unspecified: Secondary | ICD-10-CM | POA: Insufficient documentation

## 2018-09-09 NOTE — Telephone Encounter (Signed)
Tried contacting pt at [phone] and pt is no longer there. Dgaddy, CMA

## 2018-09-09 NOTE — ED Notes (Signed)
Pt refused vitals to be updated

## 2018-09-09 NOTE — ED Provider Notes (Signed)
[facility] DEPT Provider Note   CSN: [id number] Arrival date & time: 09/09/18  1535     History   Chief Complaint Chief Complaint  Patient presents with  . body pains    HPI Tiffany [name] is a 58 y.o. female.     HPI   Patient states she is here because she aches all over, is crying a lot and does not have any place to stay.  She states that she has been homeless for about 3 months.  [name] having difficulty along with her parents, and last night was sleeping in her father's truck, in his yard.  She reports she talked to her psychiatrist on phone last week, and states that she is taking her usual medications.  [name] worried that she has gout because she "hurts all over."  She denies fever, cough, dizziness or vomiting.  She would like to see a counselor here, and consider "going to mental health."  She denies suicidal, or homicidal ideation.  There are no other known modifying factors.  Past Medical History:  Diagnosis Date  . Anxiety   . Arthritis    "all over" (04/10/2016)  . Asthma   . Cardiac arrest ([place]) 09/08/2016   PEA  . Carotid artery stenosis    1-39% bilateral by dopplers 11/2016  . Chronic bronchitis ([place])   . Chronic diastolic (congestive) heart failure ([place])   . Chronic kidney disease    "I see a kidney dr." (04/10/2016)  . Cocaine abuse ([place])   . Complication of anesthesia    decreased bp, decreased heart rate  . Depression   . Disorder of nervous system   . Emphysema   . GERD (gastroesophageal reflux disease)   . Heart attack ([place]) 1980s  . History of blood transfusion 1994   "couldn't stop bleeding from my period"  . Hyperlipidemia LDL goal <70   . Hypertension   . Incontinence   . Manic depression ([place])   . On home oxygen therapy    "6L; 24/7" (04/10/2016)  . OSA on CPAP    "wear mask sometimes" (04/10/2016)  . Paranoid ([place])    "sometimes; I'm on RX for it" (04/10/2016)  . Pneumonia    "I've had it several  times; haven't had it since 06/2015" (04/10/2016)  . Schizophrenia ([place])   . Seasonal allergies   . Seizures ([place])    "don't know what kind; last one was ~ 1 yr ago" (04/10/2016)  . Sinus trouble   . Stroke [facility]) 1980s   denies residual on 04/10/2016  . Type II diabetes mellitus [facility])     Patient Active Problem List   Diagnosis Date Noted  . Chronic posttraumatic stress disorder 05/27/2018  . Schizoaffective disorder, bipolar type ([place]) 04/05/2018  . Closed displaced fracture of fifth metacarpal bone 03/21/2018  . Difficulty with speech 01/24/2018  . Vaginal discharge 01/03/2018  . AKI (acute kidney injury) ([place]) 11/21/2017  . Encephalopathy 11/21/2017  . Drug abuse ([place]) 11/21/2017  . Frequent falls 10/11/2017  . Binge eating disorder   . Dependence on continuous supplemental oxygen 05/14/2017  . Gout 04/11/2017  . Palliative care encounter 03/12/2017  . CKD (chronic kidney disease) stage 3, GFR 30-59 ml/min (HCC) 12/15/2016  . Carotid artery stenosis   . Osteoarthritis 10/26/2016  . Anoxic brain injury ([place]) 09/08/2016  . Overactive bladder 06/07/2016  . QT prolongation   . OSA and COPD overlap syndrome ([place])   . Arthritis   .  Essential hypertension 03/22/2016  . Cocaine use disorder, severe, in sustained remission ([place]) 12/17/2015  . History of drug abuse in remission ([place]) 11/28/2015  . Chronic diastolic congestive heart failure ([place])   . Chronic respiratory failure with hypoxia and hypercapnia ([place]) 06/22/2015  . Tobacco use disorder 07/22/2014  . COPD (chronic obstructive pulmonary disease) ([place]) 07/08/2014  . Seizure ([place]) 01/04/2013  . Chronic pain syndrome 06/18/2012  . Dyslipidemia 04/24/2011  . Anemia 04/24/2011  . Diabetic neuropathy ([place]) 04/24/2011  . Obstructive sleep apnea 10/18/2010  . Asthma 10/18/2010  . Morbid obesity ([place]) 10/18/2010  . Type 2 diabetes mellitus ([place]) 10/18/2010    Past Surgical History:  Procedure Laterality Date  . CESAREAN  SECTION  1997  . HERNIA REPAIR    . IR CHOLANGIOGRAM EXISTING TUBE  07/20/2016  . IR PERC CHOLECYSTOSTOMY  05/10/2016  . IR RADIOLOGIST EVAL & MGMT  06/08/2016  . IR RADIOLOGIST EVAL & MGMT  06/29/2016  . IR SINUS/FIST TUBE CHK-NON GI  07/12/2016  . RIGHT/LEFT HEART CATH AND CORONARY ANGIOGRAPHY N/A 06/19/2017   Procedure: RIGHT/LEFT HEART CATH AND CORONARY ANGIOGRAPHY;  Surgeon: Jolaine Artist, MD;  Location: [name];  Service: Cardiovascular;  Laterality: N/A;  . TIBIA IM NAIL INSERTION Right 07/12/2016   Procedure: INTRAMEDULLARY (IM) NAIL RIGHT TIBIA;  Surgeon: Leandrew Koyanagi, MD;  Location: [facility];  Service: Orthopedics;  Laterality: Right;  . UMBILICAL HERNIA REPAIR  ~ 1963   "that's why I don't have a belly button"  . VAGINAL HYSTERECTOMY       OB History    Gravida  4   Para  4   Term  3   Preterm  1   AB  0   Living  3     SAB  0   TAB  0   Ectopic  0   Multiple  0   Live Births  3            Home Medications    Prior to Admission medications   Medication Sig Start Date End Date Taking? Authorizing Provider  albuterol (PROVENTIL) (2.5 MG/3ML) 0.083% nebulizer solution Take 3 mLs (2.5 mg total) by nebulization every 6 (six) hours as needed for wheezing or shortness of breath. 03/25/18   Martyn Ehrich, NP  albuterol (VENTOLIN HFA) 108 (90 Base) MCG/ACT inhaler Inhale 2 puffs into the lungs every 6 (six) hours as needed for wheezing or shortness of breath. 08/13/18   Lauraine Rinne, NP  amLODipine (NORVASC) 5 MG tablet Take 5 mg by mouth daily.    [provider]  aspirin (GOODSENSE ASPIRIN LOW DOSE) 81 MG EC tablet TAKE 1 TABLET (81 MG TOTAL) BY MOUTH DAILY (MORNING). Patient taking differently: Take 81 mg by mouth daily.  11/12/17   Zenia Resides, MD  atorvastatin (LIPITOR) 80 MG tablet Take 1 tablet (80 mg total) by mouth daily. 08/05/18   Maximiano Coss, NP  budesonide-formoterol [facility]) 80-4.5 MCG/ACT inhaler Inhale into the  lungs. 11/12/08   [provider]  cephALEXin (KEFLEX) 250 MG capsule  08/02/18   [provider]  cloNIDine (CATAPRES) 0.2 MG tablet Take by mouth. 11/12/08   [provider]  colchicine (COLCRYS) 0.6 MG tablet TAKE 1 TABLET (0.6 MG TOTAL) BY MOUTH 2 (TWO) TIMES DAILY. Patient taking differently: Take 0.6 mg by mouth 2 (two) times daily.  10/17/17   Zenia Resides, MD  cyclobenzaprine (FLEXERIL) 5 MG tablet Take 1 tablet (5  mg total) by mouth 3 (three) times daily as needed for muscle spasms. 08/30/18   Maximiano Coss, NP  diazepam (VALIUM) 10 MG tablet Take 1 tablet (10 mg total) by mouth 3 (three) times daily. 08/09/18 11/07/18  Pucilowski, Marchia Bond, MD  diclofenac (VOLTAREN) 75 MG EC tablet Take by mouth. 07/10/18   [provider]  diclofenac sodium (VOLTAREN) 1 % GEL Apply 2 g topically 4 (four) times daily. Patient taking differently: Apply 2 g topically 4 (four) times daily as needed (pain).  01/21/18   Wieters, Hallie C, PA-C  docusate sodium (COLACE) 100 MG capsule Take by mouth. 11/12/08   [provider]  escitalopram (LEXAPRO) 20 MG tablet Take 2 tablets (40 mg total) by mouth daily. 08/09/18 11/07/18  Pucilowski, Marchia Bond, MD  febuxostat (ULORIC) 40 MG tablet Take 1 tablet by mouth daily. 03/27/18   [provider]  ferrous sulfate (FEROSUL) 325 (65 FE) MG tablet TAKE 1 TABLET BY MOUTH DAILY.(MORNING) Patient taking differently: Take 325 mg by mouth daily with breakfast.  11/12/17   Zenia Resides, MD  flunisolide (NASALIDE) 25 MCG/ACT (0.025%) SOLN Place 2 sprays into the nose daily. 06/26/17   McDiarmid, Blane Ohara, MD  fluticasone (FLONASE) 50 MCG/ACT nasal spray Place into the nose. 11/12/08   [provider]  Fluticasone-Umeclidin-Vilant (TRELEGY ELLIPTA) 100-62.5-25 MCG/INH AEPB Inhale 1 puff into the lungs daily. 08/13/18   Lauraine Rinne, NP  gabapentin (NEURONTIN) 600 MG tablet Take 1 tablet (600 mg total) by mouth 2  (two) times daily. Patient taking differently: Take 1,200 mg by mouth 3 (three) times daily.  11/23/17   Glenis Smoker, MD  glucose blood (PRECISION QID TEST) test strip by Does not apply route. 02/07/07   [provider]  hydrochlorothiazide (MICROZIDE) 12.5 MG capsule Take by mouth. 11/12/08   [provider]  ipratropium (ATROVENT) 0.03 % nasal spray Place into the nose. 11/12/08   [provider]  lisdexamfetamine (VYVANSE) 70 MG capsule Take 1 capsule (70 mg total) by mouth daily. 09/11/18 10/11/18  Pucilowski, Marchia Bond, MD  methocarbamol (ROBAXIN) 500 MG tablet Take by mouth. 06/04/18   [provider]  metolazone (ZAROXOLYN) 2.5 MG tablet Take 1 tablet (2.5 mg total) by mouth daily. 04/22/18   Robyn Haber, MD  miconazole (MICONAZOLE 7) 2 % vaginal cream Place 1 Applicatorful vaginally at bedtime. 03/18/18   Larene Pickett, PA-C  montelukast (SINGULAIR) 10 MG tablet Take by mouth. 06/23/08   [provider]  montelukast (SINGULAIR) 4 MG chewable tablet Chew by mouth. 11/12/08   [provider]  Multiple Vitamin (MULTIVITAMIN WITH MINERALS) TABS tablet Take 1 tablet by mouth daily.    [provider]  omeprazole (PRILOSEC) 40 MG capsule Take 1 capsule (40 mg total) by mouth 2 (two) times daily. 10/17/17   Zenia Resides, MD  oxybutynin (DITROPAN-XL) 10 MG 24 hr tablet TAKE 1 TABLET BY MOUTH EVERYDAY AT BEDTIME 08/27/18   Maximiano Coss, NP  polyethylene glycol (MIRALAX / GLYCOLAX) 17 g packet Take by mouth. 11/12/08   [provider]  potassium chloride SA (K-DUR) 20 MEQ tablet Take by mouth. 11/12/08   [provider]  SAPHRIS 2.5 MG SUBL Place 1 tablet (2.5 mg total) under the tongue 3 (three) times daily. 08/09/18 11/07/18  Pucilowski, Marchia Bond, MD  simvastatin (ZOCOR) 40 MG tablet Take by mouth. 11/12/08   [provider]  tacrolimus (PROTOPIC) 0.03 % ointment Apply topically 2 (two) times  daily. 08/30/18   Maximiano Coss, NP  terconazole (TERAZOL 3) 0.8 % vaginal cream Apply pea sized amount to clitoris nightly. 04/18/18   Sloan Leiter, MD  torsemide (DEMADEX) 20 MG tablet Take 4 tablets (80 mg total) by mouth 2 (two) times daily. 06/06/17   Mikell, Jeani Sow, MD  traZODone (DESYREL) 100 MG tablet Take 2 tablets (200 mg total) by mouth at bedtime. 08/09/18 11/07/18  Pucilowski, Marchia Bond, MD  valACYclovir (VALTREX) 500 MG tablet Take by mouth. 11/12/08   [provider]  valsartan (DIOVAN) 80 MG tablet Take by mouth. 11/12/08   [provider]  zinc oxide 20 % ointment Apply 1 application topically as needed for irritation. 02/07/18   Zenia Resides, MD    Family History Family History  Problem Relation Age of Onset  . Cancer Father        prostate  . Cancer Mother        lung  . Depression Mother   . Depression Sister   . Anxiety disorder Sister   . Schizophrenia Sister   . Bipolar disorder Sister   . Depression Sister   . Depression Brother   . Heart failure Other        cousin    Social History Social History   Tobacco Use  . Smoking status: Former Smoker    Packs/day: 1.50    Years: 38.00    Pack years: 57.00    Types: Cigarettes    Start date: 03/13/1977    Quit date: 04/10/2016    Years since quitting: 2.4  . Smokeless tobacco: Never Used  Substance Use Topics  . Alcohol use: No    Alcohol/week: 0.0 standard drinks  . Drug use: No    Types: Cocaine    Comment: 04/10/2016 "last used cocaine back in November 2017"     Allergies   Hydrocodone-acetaminophen, Hydroxyzine, Latuda [lurasidone hcl], Magnesium-containing compounds, Prednisone, Tramadol, Codeine, Sulfa antibiotics, and Tape   Review of Systems Review of Systems  All other systems reviewed and are negative.    Physical Exam Updated Vital Signs BP (!) 157/120 (BP Location: Right Arm) Comment: Pt crying  Pulse 72   Temp 98.8 F (37.1 C) (Oral)   Resp 20    SpO2 100%   Physical Exam Vitals signs and nursing note reviewed.  Constitutional:      Appearance: [name] well-developed.  HENT:     Head: Normocephalic and atraumatic.     Right Ear: External ear normal.     Left Ear: External ear normal.  Eyes:     Conjunctiva/sclera: Conjunctivae normal.     Pupils: Pupils are equal, round, and reactive to light.  Neck:     Musculoskeletal: Normal range of motion and neck supple.     Trachea: Phonation normal.  Cardiovascular:     Rate and Rhythm: Normal rate and regular rhythm.     Heart sounds: Normal heart sounds.  Pulmonary:     Effort: Pulmonary effort is normal.     Breath sounds: Normal breath sounds.  Abdominal:     Palpations: Abdomen is soft.     Tenderness: There is no abdominal tenderness.  Musculoskeletal: Normal range of motion.     Right lower leg: Edema present.     Left lower leg: Edema present.     Comments: 1+ bilateral lower leg edema.  Skin:    General: Skin is warm and dry.  Neurological:     Mental Status: She  is alert and oriented to person, place, and time.     Cranial Nerves: No cranial nerve deficit.     Sensory: No sensory deficit.     Motor: No abnormal muscle tone.     Coordination: Coordination normal.  Psychiatric:        Mood and Affect: Mood normal. Mood is not anxious.        Speech: [name] communicative. Speech is not rapid and pressured or slurred.        Behavior: Behavior normal. Behavior is not agitated.        Thought Content: Thought content normal. Thought content is not paranoid or delusional. Thought content does not include homicidal or suicidal plan.        Cognition and Memory: Cognition is not impaired.        Judgment: Judgment normal. Judgment is not impulsive.      ED Treatments / Results  Labs (all labs ordered are listed, but only abnormal results are displayed) Labs Reviewed - No data to display  EKG None  Radiology No results found.  Procedures Procedures  (including critical care time)  Medications Ordered in ED Medications - No data to display   Initial Impression / Assessment and Plan / ED Course  I have reviewed the triage vital signs and the nursing notes.  Pertinent labs & imaging results that were available during my care of the patient were reviewed by me and considered in my medical decision making (see chart for details).         Patient Vitals for the past 24 hrs:  BP Temp Temp src Pulse Resp SpO2  09/09/18 2024 (!) 157/120 - - 72 20 100 %  09/09/18 1552 (!) 180/86 98.8 F (37.1 C) Oral 86 19 98 %    11:09 PM Reevaluation with update and discussion. After initial assessment and treatment, an updated evaluation reveals no change in clinical status.  Patient's labs not obtained yet. Daleen Bo   11:58 PM.-Patient standing in the hall, and worried about not being able to find her oxygen tank.  According to nursing, the patient presented to the ED after midnight last night, and left prior to evaluation because she could not bring a large volume of personal effects with her.  Patient does not appear to need psychiatric admission at this time.  No clinical evidence for unstable medical processes.  Medical Decision Making: Malaise with nonspecific symptoms.  Doubt acute gout flare, serious bacterial infection or metabolic instability.  Patient with chronic psychiatric illness, without evident acute instability.  Screening labs were ordered, during a period of bservation.  Delay encounter to get labs.  No decompensation during this time.  CRITICAL CARE-no Performed by: Daleen Bo   Nursing Notes Reviewed/ Care Coordinated Applicable Imaging Reviewed Interpretation of Laboratory Data incorporated into ED treatment  The patient appears reasonably screened and/or stabilized for discharge and I doubt any other medical condition or other [facility] requiring further screening, evaluation, or treatment in the ED at this time prior to  discharge.  Plan: Home Medications-continue usual; Home Treatments-rest, fluids; return here if the recommended treatment, does not improve the symptoms; Recommended follow up-psychiatry and PCP as needed.    Final Clinical Impressions(s) / ED Diagnoses   Final diagnoses:  None    ED Discharge Orders    None       Daleen Bo, MD 09/10/18 [phone]

## 2018-09-09 NOTE — ED Notes (Signed)
Pt's belongings moved back to hall C with pt in a second wheel chair. Belongings placed beside hall C.

## 2018-09-09 NOTE — Telephone Encounter (Signed)
Returned patient call. She reports she is going to the hospital because she does not feel well and she wants to speak with NP Enid Derry cates about something they had previously discussed. Message given to [employer].  Pt callback number is [phone]

## 2018-09-09 NOTE — Telephone Encounter (Signed)
Tried contacting pt at [phone] and pt no longer works there.   Dgaddy, CMA

## 2018-09-09 NOTE — ED Notes (Signed)
Pt escorted to bathroom at this time; pt states that she forgot to give sample.
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
## 2018-09-09 NOTE — ED Notes (Signed)
Charge nurse out speaking with patient in parking lot.
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
## 2018-09-09 NOTE — ED Notes (Signed)
Pt called to be taken back to room, pt began yelling at staff and cussing.

## 2018-09-10 ENCOUNTER — Encounter (HOSPITAL_COMMUNITY): Payer: Self-pay | Admitting: Emergency Medicine

## 2018-09-10 ENCOUNTER — Other Ambulatory Visit: Payer: Self-pay

## 2018-09-10 ENCOUNTER — Telehealth: Payer: Self-pay

## 2018-09-10 ENCOUNTER — Telehealth: Payer: Self-pay | Admitting: Internal Medicine

## 2018-09-10 ENCOUNTER — Ambulatory Visit (HOSPITAL_COMMUNITY)
Admission: AD | Admit: 2018-09-10 | Discharge: 2018-09-10 | Disposition: A | Discharge status: Home / Self Care | Payer: Medicare HMO | Source: Home / Self Care | Attending: Psychiatry | Admitting: Psychiatry

## 2018-09-10 ENCOUNTER — Ambulatory Visit: Payer: Medicare HMO | Admitting: Emergency Medicine

## 2018-09-10 ENCOUNTER — Emergency Department (HOSPITAL_COMMUNITY)
Admission: EM | Admit: 2018-09-10 | Discharge: 2018-09-10 | Discharge status: Against Medical Advice | Payer: Medicare HMO | Source: Home / Self Care

## 2018-09-10 DIAGNOSIS — Z882 Allergy status to sulfonamides status: Secondary | ICD-10-CM | POA: Insufficient documentation

## 2018-09-10 DIAGNOSIS — G4700 Insomnia, unspecified: Secondary | ICD-10-CM | POA: Insufficient documentation

## 2018-09-10 DIAGNOSIS — Z885 Allergy status to narcotic agent status: Secondary | ICD-10-CM | POA: Insufficient documentation

## 2018-09-10 DIAGNOSIS — F332 Major depressive disorder, recurrent severe without psychotic features: Secondary | ICD-10-CM | POA: Insufficient documentation

## 2018-09-10 DIAGNOSIS — Z818 Family history of other mental and behavioral disorders: Secondary | ICD-10-CM | POA: Insufficient documentation

## 2018-09-10 DIAGNOSIS — Z5321 Procedure and treatment not carried out due to patient leaving prior to being seen by health care provider: Secondary | ICD-10-CM | POA: Insufficient documentation

## 2018-09-10 DIAGNOSIS — Z888 Allergy status to other drugs, medicaments and biological substances status: Secondary | ICD-10-CM | POA: Insufficient documentation

## 2018-09-10 DIAGNOSIS — Z9981 Dependence on supplemental oxygen: Secondary | ICD-10-CM | POA: Insufficient documentation

## 2018-09-10 DIAGNOSIS — Z87891 Personal history of nicotine dependence: Secondary | ICD-10-CM | POA: Insufficient documentation

## 2018-09-10 DIAGNOSIS — M7918 Myalgia, other site: Secondary | ICD-10-CM | POA: Diagnosis not present

## 2018-09-10 NOTE — Telephone Encounter (Signed)
Received call from patient, requesting a call from Palliative NP

## 2018-09-10 NOTE — Telephone Encounter (Signed)
Received a call from patient who informed me that she "was not feeling well" and that she was going to the hospital this AM to seek evaluation and a possible admission due to her behavioral health.  "I can't stop crying and I have no where to live".  Denies SI.  [name] currently at her father's home and her sister will be transporting her to the hospital.  Informed patient of the location [place] and I agree with the need for evaluation due to her most recent increase in paranoia and depression.  Support given.  She plans on contacting this provider with her location and new telephone number.  Gonzella Lex, NP-C

## 2018-09-10 NOTE — ED Triage Notes (Signed)
Pt BIB [facility] from [facility], per [facility] RN pt homicidal and is oxygen dependent. Pt presents with no O2. C/o shortness of breath.
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
## 2018-09-10 NOTE — Progress Notes (Signed)
Joint visit with Jasmine Awe, MSW per request of Enid Derry NP. Patient A&O x4, verbally responsive to questions and engaged in conversation. Patient denies any pain. Respirations unlabored at rest, remains on oxygen @ 4 l n/c.

## 2018-09-10 NOTE — ED Notes (Signed)
Pt here voluntarily, became agitated and left ED.

## 2018-09-10 NOTE — H&P (Signed)
Behavioral Health Medical Screening Exam  Tiffany [name] is an 58 y.o. female.  Total Time spent with patient: 30 minutes  Psychiatric Specialty Exam: Physical Exam  Constitutional: [name] oriented to person, place, and time. She appears well-developed and well-nourished. No distress.  HENT:  Head: Normocephalic and atraumatic.  Right Ear: External ear normal.  Left Ear: External ear normal.  Eyes: Pupils are equal, round, and reactive to light. Right eye exhibits no discharge. Left eye exhibits no discharge.  Respiratory: Effort normal. No respiratory distress.  Musculoskeletal: Normal range of motion.  Neurological: [name] alert and oriented to person, place, and time.  Skin: [name] not diaphoretic.  Psychiatric: Her mood appears anxious. [name] not withdrawn and not actively hallucinating. Thought content is paranoid and delusional. She exhibits a depressed mood. She expresses no homicidal and no suicidal ideation.    Review of Systems  Constitutional: Positive for malaise/fatigue. Negative for chills, diaphoresis, fever and weight loss.  Respiratory: Positive for shortness of breath.   Cardiovascular: Negative for chest pain.  Gastrointestinal: Negative for diarrhea, nausea and vomiting.  Psychiatric/Behavioral: Positive for depression and hallucinations. Negative for memory loss, substance abuse and suicidal ideas. The patient is nervous/anxious and has insomnia.     There were no vitals taken for this visit.There is no height or weight on file to calculate BMI.  General Appearance: Disheveled  Eye Contact:  Fair  Speech:  Clear and Coherent and Normal Rate  Volume:  Decreased  Mood:  Anxious, Depressed, Hopeless, Irritable and Worthless  Affect:  Congruent and Depressed  Thought Process:  Coherent, Goal Directed and Descriptions of Associations: Loose  Orientation:  Full (Time, Place, and Person)  Thought Content:  Delusions and Paranoid Ideation  Suicidal Thoughts:  No   Homicidal Thoughts:  No  Memory:  Immediate;   Fair Recent;   Fair  Judgement:  Impaired  Insight:  Lacking  Psychomotor Activity:  Psychomotor Retardation  Concentration: Concentration: Fair and Attention Span: Fair  Recall:  Good  Fund of Knowledge:Good  Language: Negative  Akathisia:  Negative  Handed:  Right  AIMS (if indicated):     Assets:  Communication Skills Desire for Improvement Leisure Time  Sleep:       Musculoskeletal: Strength & Muscle Tone: within normal limits Gait & Station: normal Patient leans: N/A  There were no vitals taken for this visit.  Recommendations:  Based on my evaluation the patient does not appear to have an emergency medical condition.  Rozetta Nunnery, NP 09/10/2018, 4:59 AM

## 2018-09-10 NOTE — Discharge Instructions (Addendum)
Continue working with your psychiatrist and see a counselor at mental health, if needed for problems.

## 2018-09-10 NOTE — Telephone Encounter (Signed)
Provider has spoken to [place] about pt.

## 2018-09-11 ENCOUNTER — Encounter (HOSPITAL_COMMUNITY): Payer: Self-pay | Admitting: Psychiatry

## 2018-09-11 ENCOUNTER — Ambulatory Visit (INDEPENDENT_AMBULATORY_CARE_PROVIDER_SITE_OTHER): Payer: Medicare HMO | Admitting: Psychiatry

## 2018-09-11 ENCOUNTER — Telehealth: Payer: Self-pay | Admitting: Registered Nurse

## 2018-09-11 DIAGNOSIS — F4312 Post-traumatic stress disorder, chronic: Secondary | ICD-10-CM | POA: Diagnosis not present

## 2018-09-11 DIAGNOSIS — F250 Schizoaffective disorder, bipolar type: Secondary | ICD-10-CM

## 2018-09-11 NOTE — Telephone Encounter (Signed)
Pt requesting oxybutynin refill. Pt states pcp said he would order a 90 day supply. Please advise

## 2018-09-11 NOTE — Telephone Encounter (Signed)
Pt can be reached at [phone].
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
## 2018-09-12 ENCOUNTER — Telehealth: Payer: Self-pay | Admitting: Registered Nurse

## 2018-09-12 NOTE — Telephone Encounter (Signed)
Pt is frustrated .. still peeing in bed ..looking to increase her  oxybutynin (DITROPAN-XL) 10 MG pt is threatning to go somewhere else. FR please call pt  . [phone]

## 2018-09-13 ENCOUNTER — Telehealth: Payer: Self-pay | Admitting: Registered Nurse

## 2018-09-13 NOTE — Telephone Encounter (Signed)
Patient needs someone to call her ASAP she is going to be put out of the place she is living if she doesn't get this medication

## 2018-09-13 NOTE — Telephone Encounter (Signed)
Discussed with Kathrin Ruddy, NP.  Unable to increase dose d/t harmful to pt's kidneys.

## 2018-09-13 NOTE — Telephone Encounter (Signed)
Attempted to reach pt at number listed below to discuss 08/11 refill and pt request to increase dose.  It is the wrong number.

## 2018-09-13 NOTE — Telephone Encounter (Signed)
Hi Rich,   The pt was given a refill on 08/11 but, per the messages, it sounds like she is still experiencing incontinence.  I wanted to make you aware as I have not been able to reach the pt to obtain more info.    Thanks, [employer]

## 2018-09-14 ENCOUNTER — Telehealth: Payer: Self-pay | Admitting: Family Medicine

## 2018-09-14 ENCOUNTER — Emergency Department (HOSPITAL_COMMUNITY)
Admission: EM | Admit: 2018-09-14 | Discharge: 2018-09-15 | Disposition: A | Discharge status: Home / Self Care | Payer: Medicare HMO | Attending: Emergency Medicine | Admitting: Emergency Medicine

## 2018-09-14 ENCOUNTER — Ambulatory Visit (HOSPITAL_COMMUNITY)
Admission: RE | Admit: 2018-09-14 | Discharge: 2018-09-14 | Disposition: A | Discharge status: Home / Self Care | Payer: Medicare HMO | Attending: Psychiatry | Admitting: Psychiatry

## 2018-09-14 ENCOUNTER — Encounter (HOSPITAL_COMMUNITY): Payer: Self-pay | Admitting: Emergency Medicine

## 2018-09-14 ENCOUNTER — Other Ambulatory Visit: Payer: Self-pay

## 2018-09-14 DIAGNOSIS — I130 Hypertensive heart and chronic kidney disease with heart failure and stage 1 through stage 4 chronic kidney disease, or unspecified chronic kidney disease: Secondary | ICD-10-CM | POA: Insufficient documentation

## 2018-09-14 DIAGNOSIS — F332 Major depressive disorder, recurrent severe without psychotic features: Secondary | ICD-10-CM | POA: Insufficient documentation

## 2018-09-14 DIAGNOSIS — F4312 Post-traumatic stress disorder, chronic: Secondary | ICD-10-CM | POA: Diagnosis not present

## 2018-09-14 DIAGNOSIS — M6281 Muscle weakness (generalized): Secondary | ICD-10-CM | POA: Diagnosis not present

## 2018-09-14 DIAGNOSIS — F29 Unspecified psychosis not due to a substance or known physiological condition: Secondary | ICD-10-CM | POA: Diagnosis not present

## 2018-09-14 DIAGNOSIS — R457 State of emotional shock and stress, unspecified: Secondary | ICD-10-CM | POA: Diagnosis not present

## 2018-09-14 DIAGNOSIS — Z8673 Personal history of transient ischemic attack (TIA), and cerebral infarction without residual deficits: Secondary | ICD-10-CM | POA: Insufficient documentation

## 2018-09-14 DIAGNOSIS — Z791 Long term (current) use of non-steroidal anti-inflammatories (NSAID): Secondary | ICD-10-CM | POA: Insufficient documentation

## 2018-09-14 DIAGNOSIS — F419 Anxiety disorder, unspecified: Secondary | ICD-10-CM | POA: Diagnosis not present

## 2018-09-14 DIAGNOSIS — I252 Old myocardial infarction: Secondary | ICD-10-CM | POA: Insufficient documentation

## 2018-09-14 DIAGNOSIS — N183 Chronic kidney disease, stage 3 (moderate): Secondary | ICD-10-CM | POA: Diagnosis not present

## 2018-09-14 DIAGNOSIS — I5032 Chronic diastolic (congestive) heart failure: Secondary | ICD-10-CM | POA: Insufficient documentation

## 2018-09-14 DIAGNOSIS — I509 Heart failure, unspecified: Secondary | ICD-10-CM | POA: Insufficient documentation

## 2018-09-14 DIAGNOSIS — F4310 Post-traumatic stress disorder, unspecified: Secondary | ICD-10-CM | POA: Diagnosis not present

## 2018-09-14 DIAGNOSIS — J449 Chronic obstructive pulmonary disease, unspecified: Secondary | ICD-10-CM | POA: Diagnosis not present

## 2018-09-14 DIAGNOSIS — Z590 Homelessness: Secondary | ICD-10-CM | POA: Insufficient documentation

## 2018-09-14 DIAGNOSIS — R062 Wheezing: Secondary | ICD-10-CM | POA: Diagnosis not present

## 2018-09-14 DIAGNOSIS — E1122 Type 2 diabetes mellitus with diabetic chronic kidney disease: Secondary | ICD-10-CM | POA: Diagnosis not present

## 2018-09-14 DIAGNOSIS — N3281 Overactive bladder: Secondary | ICD-10-CM

## 2018-09-14 DIAGNOSIS — J45909 Unspecified asthma, uncomplicated: Secondary | ICD-10-CM | POA: Diagnosis not present

## 2018-09-14 DIAGNOSIS — Z79899 Other long term (current) drug therapy: Secondary | ICD-10-CM | POA: Insufficient documentation

## 2018-09-14 DIAGNOSIS — N189 Chronic kidney disease, unspecified: Secondary | ICD-10-CM | POA: Diagnosis not present

## 2018-09-14 DIAGNOSIS — I10 Essential (primary) hypertension: Secondary | ICD-10-CM | POA: Diagnosis not present

## 2018-09-14 MED ORDER — OXYBUTYNIN CHLORIDE ER 10 MG PO TB24: 10.0000 mg | ORAL_TABLET | Freq: Every day | ORAL | 1 refills | Status: DC
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
## 2018-09-14 NOTE — ED Notes (Signed)
Patient yelling at staff to give her a washcloth to wipe her mouth-explained to patient that one would be provided as soon as staff could-please not yell at staff in the hallway
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
## 2018-09-14 NOTE — ED Notes (Signed)
Pt given wet wash cloth to wipe her lips off, per request of pt.

## 2018-09-14 NOTE — ED Notes (Signed)
Pt asked for something to moisturize her lips with. Pt given lubrication to moisten her lips. Pt denied needing anything else at this time.

## 2018-09-14 NOTE — Telephone Encounter (Signed)
Opened in error. See other phone  note 

## 2018-09-14 NOTE — ED Notes (Signed)
Pt sitting in bed with 2 red bags

## 2018-09-14 NOTE — ED Notes (Signed)
Pt yelling from [place], "can I get some damn help!!!". Pt asked what she needed help with, pt requested a washcloth to wipe her lips off with.

## 2018-09-14 NOTE — ED Provider Notes (Signed)
[facility] DEPT Provider Note   CSN: [id number] Arrival date & time: 09/14/18  2203     History   Chief Complaint Chief Complaint  Patient presents with  . Anxiety  . Depression    HPI Tiffany [name] is a 58 y.o. female.     The history is provided by the patient and medical records. No language interpreter was used.  Anxiety  Depression     58 year old female with history of bipolar, polysubstance abuse including cocaine, paranoia, schizophrenia, brought here via EMS for evaluation of anxiety.  Patient states she is homeless.  [name] having increased anxiety due to her current situation.  She states mom is going.  And she feels violated.  She spent a moderate amount of time sharing with me about her posttraumatic stress including her difficult childhood, people that she lost throughout her life, and her regular anxiety.  She does not report of any suicidal or homicidal ideation.  She admits she is hungry and would like some food to eat.  She report having difficulty sleeping.  She denies any active pain.  [name] supplemental oxygen.  Past Medical History:  Diagnosis Date  . Anxiety   . Arthritis    "all over" (04/10/2016)  . Asthma   . Cardiac arrest ([place]) 09/08/2016   PEA  . Carotid artery stenosis    1-39% bilateral by dopplers 11/2016  . Chronic bronchitis ([place])   . Chronic diastolic (congestive) heart failure ([place])   . Chronic kidney disease    "I see a kidney dr." (04/10/2016)  . Cocaine abuse ([place])   . Complication of anesthesia    decreased bp, decreased heart rate  . Depression   . Disorder of nervous system   . Emphysema   . GERD (gastroesophageal reflux disease)   . Heart attack ([place]) 1980s  . History of blood transfusion 1994   "couldn't stop bleeding from my period"  . Hyperlipidemia LDL goal <70   . Hypertension   . Incontinence   . Manic depression ([place])   . On home oxygen therapy    "6L; 24/7" (04/10/2016)  . OSA  on CPAP    "wear mask sometimes" (04/10/2016)  . Paranoid ([place])    "sometimes; I'm on RX for it" (04/10/2016)  . Pneumonia    "I've had it several times; haven't had it since 06/2015" (04/10/2016)  . Schizophrenia ([place])   . Seasonal allergies   . Seizures ([place])    "don't know what kind; last one was ~ 1 yr ago" (04/10/2016)  . Sinus trouble   . Stroke [facility]) 1980s   denies residual on 04/10/2016  . Type II diabetes mellitus [facility])     Patient Active Problem List   Diagnosis Date Noted  . Chronic posttraumatic stress disorder 05/27/2018  . Schizoaffective disorder, bipolar type ([place]) 04/05/2018  . Closed displaced fracture of fifth metacarpal bone 03/21/2018  . Difficulty with speech 01/24/2018  . Vaginal discharge 01/03/2018  . AKI (acute kidney injury) ([place]) 11/21/2017  . Encephalopathy 11/21/2017  . Drug abuse ([place]) 11/21/2017  . Frequent falls 10/11/2017  . Binge eating disorder   . Dependence on continuous supplemental oxygen 05/14/2017  . Gout 04/11/2017  . Palliative care encounter 03/12/2017  . CKD (chronic kidney disease) stage 3, GFR 30-59 ml/min (HCC) 12/15/2016  . Carotid artery stenosis   . Osteoarthritis 10/26/2016  . Anoxic brain injury ([place]) 09/08/2016  . Overactive bladder 06/07/2016  . QT prolongation   .  OSA and COPD overlap syndrome ([place])   . Arthritis   . Essential hypertension 03/22/2016  . Cocaine use disorder, severe, in sustained remission ([place]) 12/17/2015  . History of drug abuse in remission ([place]) 11/28/2015  . Chronic diastolic congestive heart failure ([place])   . Chronic respiratory failure with hypoxia and hypercapnia ([place]) 06/22/2015  . Tobacco use disorder 07/22/2014  . COPD (chronic obstructive pulmonary disease) ([place]) 07/08/2014  . Seizure ([place]) 01/04/2013  . Chronic pain syndrome 06/18/2012  . Dyslipidemia 04/24/2011  . Anemia 04/24/2011  . Diabetic neuropathy ([place]) 04/24/2011  . Obstructive sleep apnea 10/18/2010  . Asthma 10/18/2010  .  Morbid obesity ([place]) 10/18/2010  . Type 2 diabetes mellitus ([place]) 10/18/2010    Past Surgical History:  Procedure Laterality Date  . CESAREAN SECTION  1997  . HERNIA REPAIR    . IR CHOLANGIOGRAM EXISTING TUBE  07/20/2016  . IR PERC CHOLECYSTOSTOMY  05/10/2016  . IR RADIOLOGIST EVAL & MGMT  06/08/2016  . IR RADIOLOGIST EVAL & MGMT  06/29/2016  . IR SINUS/FIST TUBE CHK-NON GI  07/12/2016  . RIGHT/LEFT HEART CATH AND CORONARY ANGIOGRAPHY N/A 06/19/2017   Procedure: RIGHT/LEFT HEART CATH AND CORONARY ANGIOGRAPHY;  Surgeon: Jolaine Artist, MD;  Location: [facility];  Service: Cardiovascular;  Laterality: N/A;  . TIBIA IM NAIL INSERTION Right 07/12/2016   Procedure: INTRAMEDULLARY (IM) NAIL RIGHT TIBIA;  Surgeon: Leandrew Koyanagi, MD;  Location: [facility];  Service: Orthopedics;  Laterality: Right;  . UMBILICAL HERNIA REPAIR  ~ 1963   "that's why I don't have a belly button"  . VAGINAL HYSTERECTOMY       OB History    Gravida  4   Para  4   Term  3   Preterm  1   AB  0   Living  3     SAB  0   TAB  0   Ectopic  0   Multiple  0   Live Births  3            Home Medications    Prior to Admission medications   Medication Sig Start Date End Date Taking? Authorizing Provider  albuterol (PROVENTIL) (2.5 MG/3ML) 0.083% nebulizer solution Take 3 mLs (2.5 mg total) by nebulization every 6 (six) hours as needed for wheezing or shortness of breath. 03/25/18   Martyn Ehrich, NP  albuterol (VENTOLIN HFA) 108 (90 Base) MCG/ACT inhaler Inhale 2 puffs into the lungs every 6 (six) hours as needed for wheezing or shortness of breath. 08/13/18   Lauraine Rinne, NP  amLODipine (NORVASC) 5 MG tablet Take 5 mg by mouth daily.    [provider]  aspirin (GOODSENSE ASPIRIN LOW DOSE) 81 MG EC tablet TAKE 1 TABLET (81 MG TOTAL) BY MOUTH DAILY (MORNING). Patient taking differently: Take 81 mg by mouth daily.  11/12/17   Zenia Resides, MD  atorvastatin (LIPITOR) 80 MG tablet  Take 1 tablet (80 mg total) by mouth daily. 08/05/18   Maximiano Coss, NP  budesonide-formoterol [facility]) 80-4.5 MCG/ACT inhaler Inhale into the lungs. 11/12/08   [provider]  cephALEXin (KEFLEX) 250 MG capsule  08/02/18   [provider]  cloNIDine (CATAPRES) 0.2 MG tablet Take by mouth. 11/12/08   [provider]  colchicine (COLCRYS) 0.6 MG tablet TAKE 1 TABLET (0.6 MG TOTAL) BY MOUTH 2 (TWO) TIMES DAILY. Patient taking differently: Take 0.6 mg by mouth 2 (two) times daily.  10/17/17   Hensel,  Jamal Collin, MD  cyclobenzaprine (FLEXERIL) 5 MG tablet Take 1 tablet (5 mg total) by mouth 3 (three) times daily as needed for muscle spasms. 08/30/18   Maximiano Coss, NP  diazepam (VALIUM) 10 MG tablet Take 1 tablet (10 mg total) by mouth 3 (three) times daily. 08/09/18 11/07/18  Pucilowski, Marchia Bond, MD  diclofenac (VOLTAREN) 75 MG EC tablet Take by mouth. 07/10/18   [provider]  diclofenac sodium (VOLTAREN) 1 % GEL Apply 2 g topically 4 (four) times daily. Patient taking differently: Apply 2 g topically 4 (four) times daily as needed (pain).  01/21/18   Wieters, Hallie C, PA-C  docusate sodium (COLACE) 100 MG capsule Take by mouth. 11/12/08   [provider]  escitalopram (LEXAPRO) 20 MG tablet Take 2 tablets (40 mg total) by mouth daily. 08/09/18 11/07/18  Pucilowski, Marchia Bond, MD  febuxostat (ULORIC) 40 MG tablet Take 1 tablet by mouth daily. 03/27/18   [provider]  ferrous sulfate (FEROSUL) 325 (65 FE) MG tablet TAKE 1 TABLET BY MOUTH DAILY.(MORNING) Patient taking differently: Take 325 mg by mouth daily with breakfast.  11/12/17   Zenia Resides, MD  flunisolide (NASALIDE) 25 MCG/ACT (0.025%) SOLN Place 2 sprays into the nose daily. 06/26/17   McDiarmid, Blane Ohara, MD  fluticasone (FLONASE) 50 MCG/ACT nasal spray Place into the nose. 11/12/08   [provider]  Fluticasone-Umeclidin-Vilant (TRELEGY ELLIPTA) 100-62.5-25 MCG/INH  AEPB Inhale 1 puff into the lungs daily. 08/13/18   Lauraine Rinne, NP  gabapentin (NEURONTIN) 600 MG tablet Take 1 tablet (600 mg total) by mouth 2 (two) times daily. Patient taking differently: Take 1,200 mg by mouth 3 (three) times daily.  11/23/17   Glenis Smoker, MD  glucose blood (PRECISION QID TEST) test strip by Does not apply route. 02/07/07   [provider]  hydrochlorothiazide (MICROZIDE) 12.5 MG capsule Take by mouth. 11/12/08   [provider]  ipratropium (ATROVENT) 0.03 % nasal spray Place into the nose. 11/12/08   [provider]  lisdexamfetamine (VYVANSE) 70 MG capsule Take 1 capsule (70 mg total) by mouth daily. 09/11/18 10/11/18  Pucilowski, Marchia Bond, MD  methocarbamol (ROBAXIN) 500 MG tablet Take by mouth. 06/04/18   [provider]  metolazone (ZAROXOLYN) 2.5 MG tablet Take 1 tablet (2.5 mg total) by mouth daily. 04/22/18   Robyn Haber, MD  miconazole (MICONAZOLE 7) 2 % vaginal cream Place 1 Applicatorful vaginally at bedtime. 03/18/18   Larene Pickett, PA-C  montelukast (SINGULAIR) 10 MG tablet Take by mouth. 06/23/08   [provider]  montelukast (SINGULAIR) 4 MG chewable tablet Chew by mouth. 11/12/08   [provider]  Multiple Vitamin (MULTIVITAMIN WITH MINERALS) TABS tablet Take 1 tablet by mouth daily.    [provider]  omeprazole (PRILOSEC) 40 MG capsule Take 1 capsule (40 mg total) by mouth 2 (two) times daily. 10/17/17   Zenia Resides, MD  oxybutynin (DITROPAN-XL) 10 MG 24 hr tablet Take 1-2 tablets (10-20 mg total) by mouth at bedtime. TAKE 1 TABLET BY MOUTH EVERYDAY AT BEDTIME 09/14/18   Wendie Agreste, MD  polyethylene glycol (MIRALAX / GLYCOLAX) 17 g packet Take by mouth. 11/12/08   [provider]  potassium chloride SA (K-DUR) 20 MEQ tablet Take by mouth. 11/12/08   [provider]  SAPHRIS 2.5 MG SUBL Place 1 tablet (2.5 mg total) under the tongue 3 (three) times daily.  08/09/18 11/07/18  Pucilowski, Marchia Bond, MD  simvastatin (  ZOCOR) 40 MG tablet Take by mouth. 11/12/08   [provider]  tacrolimus (PROTOPIC) 0.03 % ointment Apply topically 2 (two) times daily. 08/30/18   Maximiano Coss, NP  terconazole (TERAZOL 3) 0.8 % vaginal cream Apply pea sized amount to clitoris nightly. 04/18/18   Sloan Leiter, MD  torsemide (DEMADEX) 20 MG tablet Take 4 tablets (80 mg total) by mouth 2 (two) times daily. 06/06/17   Mikell, Jeani Sow, MD  traZODone (DESYREL) 100 MG tablet Take 2 tablets (200 mg total) by mouth at bedtime. 08/09/18 11/07/18  Pucilowski, Marchia Bond, MD  valACYclovir (VALTREX) 500 MG tablet Take by mouth. 11/12/08   [provider]  valsartan (DIOVAN) 80 MG tablet Take by mouth. 11/12/08   [provider]  zinc oxide 20 % ointment Apply 1 application topically as needed for irritation. 02/07/18   Zenia Resides, MD    Family History Family History  Problem Relation Age of Onset  . Cancer Father        prostate  . Cancer Mother        lung  . Depression Mother   . Depression Sister   . Anxiety disorder Sister   . Schizophrenia Sister   . Bipolar disorder Sister   . Depression Sister   . Depression Brother   . Heart failure Other        cousin    Social History Social History   Tobacco Use  . Smoking status: Former Smoker    Packs/day: 1.50    Years: 38.00    Pack years: 57.00    Types: Cigarettes    Start date: 03/13/1977    Quit date: 04/10/2016    Years since quitting: 2.4  . Smokeless tobacco: Never Used  Substance Use Topics  . Alcohol use: No    Alcohol/week: 0.0 standard drinks  . Drug use: No    Types: Cocaine    Comment: 04/10/2016 "last used cocaine back in November 2017"     Allergies   Hydrocodone-acetaminophen, Hydroxyzine, Latuda [lurasidone hcl], Magnesium-containing compounds, Prednisone, Tramadol, Codeine, Sulfa antibiotics, and Tape   Review of Systems Review of Systems   Psychiatric/Behavioral: Positive for depression.  All other systems reviewed and are negative.    Physical Exam Updated Vital Signs BP (!) 156/100 (BP Location: Left Arm)   Pulse 80   Temp 98.1 F (36.7 C) (Oral)   Resp 19   Ht 5\' 10"  (1.778 m)   Wt 64.9 kg   SpO2 98%   BMI 20.52 kg/m   Physical Exam Vitals signs and nursing note reviewed.  Constitutional:      General: [name] not in acute distress.    Appearance: [name] well-developed.     Comments: Patient talking a whisper voice but is loquacious.  HENT:     Head: Atraumatic.  Eyes:     Conjunctiva/sclera: Conjunctivae normal.  Neck:     Musculoskeletal: Neck supple.  Cardiovascular:     Rate and Rhythm: Normal rate and regular rhythm.     Pulses: Normal pulses.     Heart sounds: Normal heart sounds.  Pulmonary:     Effort: Pulmonary effort is normal.     Breath sounds: Normal breath sounds. No wheezing.  Skin:    Findings: No rash.  Neurological:     General: No focal deficit present.     Mental Status: [name] alert.  Psychiatric:        Mood and Affect: Mood is depressed.  Speech: Speech is tangential.        Behavior: Behavior is cooperative.        Thought Content: Thought content does not include homicidal or suicidal ideation.      ED Treatments / Results  Labs (all labs ordered are listed, but only abnormal results are displayed) Labs Reviewed - No data to display  EKG None  Radiology No results found.  Procedures Procedures (including critical care time)  Medications Ordered in ED Medications - No data to display   Initial Impression / Assessment and Plan / ED Course  I have reviewed the triage vital signs and the nursing notes.  Pertinent labs & imaging results that were available during my care of the patient were reviewed by me and considered in my medical decision making (see chart for details).        BP (!) 156/100 (BP Location: Left Arm)   Pulse 80   Temp 98.1  F (36.7 C) (Oral)   Resp 19   Ht 5\' 10"  (1.778 m)   Wt 64.9 kg   SpO2 98%   BMI 20.52 kg/m    Final Clinical Impressions(s) / ED Diagnoses   Final diagnoses:  Anxiety  PTSD (post-traumatic stress disorder)    ED Discharge Orders    None     12:07 AM Patient is homeless, here with report of having anxiety and depression secondary to her PTSD.  [name] not SI no HI.  [name] otherwise without any medical complaints.  She does not meet inpatient criteria for psychiatric admission.  Patient given treatment and will discharge with outpatient resources.  She does have a psychiatrist to follow-up.   Domenic Moras, PA-C 09/15/18 0011    Fatima Blank, MD 09/24/18 [phone]

## 2018-09-14 NOTE — ED Notes (Signed)
Pt yelling from bed "ain't nobody gonna do anything for me. Ain't nobody gonna give me a damn washcloth". Per previous note, pt given washcloths. Pt asked again to refrain from yelling.

## 2018-09-14 NOTE — ED Notes (Addendum)
Pt sitting in bed with several picture frames propped on her two red bags with a religious  book in her lap. Pt is yelling out for lip moisturizer. Pt given lubrication for lips and wet washcloth.

## 2018-09-14 NOTE — Telephone Encounter (Signed)
Call from answering service.   PCP: Maximiano Coss, NP  Patient called answering service regarding concerns of her incontinence, has had persistent incontinence, states she had previously taken oxybutynin 4 times per day and has run out.  Apparently prescription recently sent for an extended release 10 mg tablet once per day only.  On chart review it appears that she has been prescribed the oxybutynin 10 mg once per day previously.  Based on office visit July 20, had had persistent urinary incontinence, without significant relief on previous medications and was referred back to urology.  Has also had acute on chronic kidney disease.  Phone message August 28, apparently has been discussed with Maximiano Coss and dose was not increased due to concern for renal function. Most recent creatinine 1.27 on July 20, but increased to 2.38 in February.  Note reviewed from 8/11 as well: "[name] requesting a refill of her oxybutynin, which she has run out of. Unfortunately, she used to take higher doses of this medication, but given her advancing CKD, she is limited to one tab daily. Additionally, she missed the call from the urologist referral after she lost her old phone and got a new number. We will resend referrals"  Called pt to discuss concern. Ultimately able to obtain history after multiple clarifications.  Had been up to 5 pills per day before.  Wants to take at least 4 pills per day.  Ran out of medicine but unable to tell me when she ran out - maybe a bout a week or so ago - only took meds every other day. Taking 4 pills every other day.  Unsure of when she will be seeing urologist - has not seen in about 7 months.   Advised that I will call pharmacy and make sure refills available until eval with Kathrin Ruddy this week. For now recommended starting Ditropan XL 10mg  once per night, but can take up to 2 only if needed for relief of symptoms, but will need to monitor renal function closely. Risks of  progressive kidney disease discussed with understanding expressed. Will also have a follow up with Rich scheduled early this week to discuss plan further and possible labs at that time.   Verified with pharmacy - no recent refill. New rx sent.  ER precautions discussed if any new or worsening symptoms over the weekend. Understanding of plan discussed.   23 min call.

## 2018-09-15 NOTE — ED Notes (Signed)
Patient demanding that we provide transportation to [facility]. Explained to patient she had an assessment yesterday and did not meet in-patient criteria. Patient states she wants to leave, made aware she has been discharged and needs to call for ride home. Patient used phone at nursing desk to call 911 to be transported to [facility]. Patient very argumentative with staff. This Probation officer had off duty to come and speak with patient about misuse of 911. Patient remains argumentative with staff and off duty.

## 2018-09-15 NOTE — ED Notes (Signed)
Pt refused to have her vitals taken. She asked to use the phone. I assisted her to a phone.
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
## 2018-09-15 NOTE — ED Notes (Addendum)
Pt still on the phone. Wants to be transported via ambulance "over there." She called 911. Charge asked me to call security.

## 2018-09-16 ENCOUNTER — Other Ambulatory Visit: Payer: Self-pay | Admitting: Family Medicine

## 2018-09-16 ENCOUNTER — Other Ambulatory Visit: Payer: Medicare HMO | Admitting: Internal Medicine

## 2018-09-16 DIAGNOSIS — D649 Anemia, unspecified: Secondary | ICD-10-CM

## 2018-09-16 DIAGNOSIS — Z515 Encounter for palliative care: Secondary | ICD-10-CM

## 2018-09-17 ENCOUNTER — Other Ambulatory Visit: Payer: Self-pay

## 2018-09-17 NOTE — Telephone Encounter (Signed)
Called pt and lvm to cb and schedule appt with Orland Mustard

## 2018-09-18 ENCOUNTER — Telehealth: Payer: Self-pay | Admitting: Registered Nurse

## 2018-09-18 ENCOUNTER — Telehealth: Payer: Self-pay | Admitting: Internal Medicine

## 2018-09-18 ENCOUNTER — Ambulatory Visit (INDEPENDENT_AMBULATORY_CARE_PROVIDER_SITE_OTHER): Payer: Medicare HMO | Admitting: Psychiatry

## 2018-09-18 ENCOUNTER — Encounter (HOSPITAL_COMMUNITY): Payer: Self-pay | Admitting: Psychiatry

## 2018-09-18 ENCOUNTER — Other Ambulatory Visit: Payer: Self-pay

## 2018-09-18 DIAGNOSIS — F250 Schizoaffective disorder, bipolar type: Secondary | ICD-10-CM | POA: Diagnosis not present

## 2018-09-18 DIAGNOSIS — F4312 Post-traumatic stress disorder, chronic: Secondary | ICD-10-CM | POA: Diagnosis not present
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
## 2018-09-18 NOTE — Progress Notes (Signed)
[facility] Pall[facility] Consult Note Telephone: [phone]  Fax: [phone]  PATIENT NAME: Tiffany [name] DOB: [birth year] MRN: [id number]  PRIMARY CARE PROVIDER:   Maximiano Coss, NP  REFERRING PROVIDER: Maximiano Coss, NP  RESPONSIBLE PARTY:   self  RECOMMENDATIONS and PLAN:  Palliative care encounter Z51.5  1.  Shortness of breath: At baseline  Related to chronic CHF with hypoxia.  Continue home O2 therapy. Encourage daily weights when returning to a home environment   2.  Anxiety:  Improved since relocating from family member's home Continue follow-up with Psychiatrist and use of prescribed meds.  Pending establishment with counselor.  Avoid stressful environments.  Follow-up with advocate to assist with locating housing.  3. Advance care planning: Pt. Continues to desire full scope of treatment and full code status.  Her goal is to attain permanent housing where she can improve management of chronic health concerns. Palliative care will continue to followup with patient.      Due to the COVID-19 crisis, this visit was performed telephonically from my office and was initiated and consented by the patient and or family.  I spent 30 minutes providing this consultation,  from 1530 to 1600 a. More than 50% of the time in this consultation was spent coordinating communication with patient.   HISTORY OF PRESENT ILLNESS:  Followup with Tiffany [name] She reports that she has had increased anxiety and was evaluated in the ER for same.  She continues to have SOB with exertion and uses supplemental O2.  [name] residing at at hotel for now.  Palliative Care was asked to help address goals of care.   CODE STATUS: Full Code and full scope of treatment  PPS: 50% HOSPICE ELIGIBILITY/DIAGNOSIS: TBD  PAST MEDICAL HISTORY:  Past Medical History:  Diagnosis Date  . Anxiety   . Arthritis    "all over" (04/10/2016)  . Asthma   . Cardiac arrest ([place])  09/08/2016   PEA  . Carotid artery stenosis    1-39% bilateral by dopplers 11/2016  . Chronic bronchitis ([place])   . Chronic diastolic (congestive) heart failure ([place])   . Chronic kidney disease    "I see a kidney dr." (04/10/2016)  . Cocaine abuse ([place])   . Complication of anesthesia    decreased bp, decreased heart rate  . Depression   . Disorder of nervous system   . Emphysema   . GERD (gastroesophageal reflux disease)   . Heart attack ([place]) 1980s  . History of blood transfusion 1994   "couldn't stop bleeding from my period"  . Hyperlipidemia LDL goal <70   . Hypertension   . Incontinence   . Manic depression ([place])   . On home oxygen therapy    "6L; 24/7" (04/10/2016)  . OSA on CPAP    "wear mask sometimes" (04/10/2016)  . Paranoid ([place])    "sometimes; I'm on RX for it" (04/10/2016)  . Pneumonia    "I've had it several times; haven't had it since 06/2015" (04/10/2016)  . Schizophrenia ([place])   . Seasonal allergies   . Seizures ([place])    "don't know what kind; last one was ~ 1 yr ago" (04/10/2016)  . Sinus trouble   . Stroke [facility]) 1980s   denies residual on 04/10/2016  . Type II diabetes mellitus ([facility])      PHYSICAL EXAM:   General: NAD Cardiovascular:unable to assess Pulmonary: no obvious increased respiratory resp effort Neurological: A&O Psych:  Appropriate conversation.  Gonzella Lex, NP

## 2018-09-18 NOTE — Telephone Encounter (Signed)
Tiffany [name] tried to reach out to pt by phone with no success no good working number.  See 09/18/2018 telephone note. Dgaddy CMA

## 2018-09-18 NOTE — Telephone Encounter (Signed)
atempted to reach pt no good phone number most recent # given [phone]  FR

## 2018-09-18 NOTE — Telephone Encounter (Signed)
Returned call to Ms. Manfredo and received a [employer]. I left a message for her and plan on calling patient again on 09/18/18 to answer her questions.  Gonzella Lex, NP-C

## 2018-09-24 ENCOUNTER — Telehealth: Payer: Self-pay | Admitting: Internal Medicine

## 2018-09-24 DIAGNOSIS — J449 Chronic obstructive pulmonary disease, unspecified: Secondary | ICD-10-CM | POA: Diagnosis not present

## 2018-09-24 NOTE — Telephone Encounter (Signed)
Returned call to [name] who reports that she is currently residing at a [employer] in [place] under quarantine.  Reports that she stayed at a women's shelter in [facility] after most recent ER visit and was notified that another person there was positive for COVID.  The health department is managing this quarantine and I have spoken to nurse Tiffany [name], per pt's request to assist with her current health concerns while isolated.  I informed [name] that nurse Tiffany [name] was informed of her need for assistance with ambulation with a walker and normally a motorized chair, and constant use of oxygen.  [name] is able to manage her own medication administration. She will be provided 3 meals per day.  If possible, she would benefit from an aid, which she normally has in a normal residential environment.  Any appointments will need to be telephonically for now.  Tiffany [name] will normally telephone pt for checkin qod, however, I asked her if she could call her daily instead.  [name] will speak to this nurse in more detail for any additional instructions.  She continues to plan on arrangements for new living arrangements after the quarantine is completed.  Will follow-up with patient next week.  Gonzella Lex, NP-C

## 2018-09-25 ENCOUNTER — Other Ambulatory Visit: Payer: Self-pay

## 2018-09-25 ENCOUNTER — Other Ambulatory Visit: Payer: Self-pay | Admitting: Registered Nurse

## 2018-09-25 ENCOUNTER — Ambulatory Visit (INDEPENDENT_AMBULATORY_CARE_PROVIDER_SITE_OTHER): Payer: Medicare HMO | Admitting: Psychiatry

## 2018-09-25 DIAGNOSIS — M109 Gout, unspecified: Secondary | ICD-10-CM

## 2018-09-25 DIAGNOSIS — F250 Schizoaffective disorder, bipolar type: Secondary | ICD-10-CM

## 2018-09-25 DIAGNOSIS — E1142 Type 2 diabetes mellitus with diabetic polyneuropathy: Secondary | ICD-10-CM

## 2018-09-25 DIAGNOSIS — F4312 Post-traumatic stress disorder, chronic: Secondary | ICD-10-CM | POA: Diagnosis not present
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
## 2018-09-26 ENCOUNTER — Other Ambulatory Visit: Payer: Medicare HMO | Admitting: Internal Medicine

## 2018-09-26 ENCOUNTER — Telehealth (HOSPITAL_COMMUNITY): Payer: Self-pay

## 2018-09-26 ENCOUNTER — Encounter (HOSPITAL_COMMUNITY): Payer: Self-pay | Admitting: Psychiatry

## 2018-09-26 ENCOUNTER — Telehealth: Payer: Self-pay | Admitting: Internal Medicine

## 2018-09-26 ENCOUNTER — Telehealth: Payer: Self-pay | Admitting: Registered Nurse
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
## 2018-09-26 NOTE — Telephone Encounter (Signed)
Multiple attempts to contact pt on personal cell phone and through [facility] where pt is in [place] per [place].  Busy signal received on both cell and hotel phone lines.   Addendum: 12:10pm- Phone call from Ms. Joneen Caraway- Midwife at [phone] Dept. In charge of Quarantine program who expressed concern about pt and her current difficulties with living in a hotel environment without personal assistance.  She and I will continue to contact pt via phone and f/u with her own attempts to secure permanent housing through the [facility].   Ms. Joneen Caraway reports that pt has a negative COVID test and she will be in quarantine until 9/15.  She could complete her isolation in her own permanent home if it were available now. Gonzella Lex, NP-C

## 2018-09-26 NOTE — Telephone Encounter (Signed)
I will call her later today - this seems a chronic issue as I have heard the same or similar story (paranoia, anxiety) few times this year. I still think that we cannot provide intensive enough treatment for her and she would be better served by having ACT or CST services.

## 2018-09-26 NOTE — Telephone Encounter (Signed)
Pt is wanting a CB from [place]. Please ask for room 5. She wants to only talk to him. She won't say what this is concerning. Please advise pt at [phone]

## 2018-09-27 ENCOUNTER — Other Ambulatory Visit: Payer: Self-pay

## 2018-09-27 ENCOUNTER — Telehealth: Payer: Self-pay | Admitting: Registered Nurse

## 2018-09-27 NOTE — Telephone Encounter (Signed)
Pt is needing NP Orland Mustard to call her at [phone] I room 105.Please try to call today because she will be in a different location on Monday . FR

## 2018-09-30 ENCOUNTER — Telehealth: Payer: Self-pay | Admitting: Internal Medicine

## 2018-09-30 DIAGNOSIS — J449 Chronic obstructive pulmonary disease, unspecified: Secondary | ICD-10-CM | POA: Diagnosis not present

## 2018-09-30 DIAGNOSIS — J9612 Chronic respiratory failure with hypercapnia: Secondary | ICD-10-CM | POA: Diagnosis not present

## 2018-09-30 DIAGNOSIS — J9611 Chronic respiratory failure with hypoxia: Secondary | ICD-10-CM | POA: Diagnosis not present

## 2018-09-30 NOTE — Telephone Encounter (Signed)
Arial Rm 105.No answer when transferred to room phone.Will attempt to f/u with patient at a later time. Gonzella Lex, NP-C

## 2018-10-01 ENCOUNTER — Ambulatory Visit (INDEPENDENT_AMBULATORY_CARE_PROVIDER_SITE_OTHER): Payer: Medicare HMO | Admitting: Psychiatry

## 2018-10-01 ENCOUNTER — Other Ambulatory Visit: Payer: Self-pay

## 2018-10-01 DIAGNOSIS — F250 Schizoaffective disorder, bipolar type: Secondary | ICD-10-CM

## 2018-10-01 DIAGNOSIS — Z765 Malingerer [conscious simulation]: Secondary | ICD-10-CM | POA: Diagnosis not present

## 2018-10-01 DIAGNOSIS — F5081 Binge eating disorder: Secondary | ICD-10-CM | POA: Diagnosis not present

## 2018-10-01 DIAGNOSIS — Z590 Homelessness: Secondary | ICD-10-CM | POA: Diagnosis not present

## 2018-10-01 DIAGNOSIS — I10 Essential (primary) hypertension: Secondary | ICD-10-CM | POA: Diagnosis not present

## 2018-10-01 DIAGNOSIS — F4312 Post-traumatic stress disorder, chronic: Secondary | ICD-10-CM

## 2018-10-01 MED ORDER — PRAZOSIN HCL 1 MG PO CAPS: 1.0000 mg | ORAL_CAPSULE | Freq: Every day | ORAL | 2 refills | Status: DC

## 2018-10-01 MED ORDER — TRAZODONE HCL 100 MG PO TABS: 200.0000 mg | ORAL_TABLET | Freq: Every day | ORAL | 0 refills | Status: DC

## 2018-10-01 MED ORDER — SAPHRIS 2.5 MG SL SUBL: 2.5000 mg | SUBLINGUAL_TABLET | Freq: Three times a day (TID) | SUBLINGUAL | 0 refills | Status: DC

## 2018-10-01 MED ORDER — LISDEXAMFETAMINE DIMESYLATE 70 MG PO CAPS: 70.0000 mg | ORAL_CAPSULE | Freq: Every day | ORAL | 0 refills | Status: DC

## 2018-10-01 MED ORDER — ESCITALOPRAM OXALATE 20 MG PO TABS: 40.0000 mg | ORAL_TABLET | Freq: Every day | ORAL | 0 refills | Status: DC

## 2018-10-01 MED ORDER — DIAZEPAM 10 MG PO TABS: 10.0000 mg | ORAL_TABLET | Freq: Three times a day (TID) | ORAL | 0 refills | Status: DC

## 2018-10-01 NOTE — Progress Notes (Signed)
BH MD/PA/NP OP Progress Note  10/01/2018 3:28 PM Tiffany [name]  MRN:  [id number] Interview was conducted by phone and I verified that I was speaking with the correct person using two identifiers. I discussed the limitations of evaluation and management by telemedicine and  the availability of in person appointments. Patient expressed understanding and agreed to proceed.  Chief Complaint: Anxiety, paranoid fears  HPI: 58yo divorced AAF with schizoaffective disorder bipolar type(vs paranoid  schizophrenia),chronicPTSD,remote hx ofcocaineaddiction (clean for 3 years)and bingeeating disorder.[name] a former patient of Dr. [name], then Dr. Frederico Hamman at [facility]. She tried ziprazidone but developed QTC prolongation on it so it was changed to Saphris which she believes is a best medication she has ever been on. [name] also on a high dose (40 mg) escitalopram and Vyvanse for binge eating disorder. Kelty used to weigh over 300 lbs and since starting Vyvanse her binge eating is well controlled and she was able to lose a lot of weight). She continues to take diazepam 10 mg tid and trazodone for sleep (150 mg plus 50 mg in am "for nerves").  She has a very strained relationship with her family - mother in particular. Continues to accuse mother of going though her belongings, stealing from her and she has called police on her few times. She feels depressed about this situation, feel abandoned and feels that family does not "want me to succeed but I will". Lexi denies feeling suicidal, no hallucinations reported. She reports being now "in a safe place" but still expresses paranoid fears about her family: "They are trying to kill me". I suggested increase in the dose of Saphris but she resists. [name] compliant with her medications, does not abuse street drugs. She would like to have a regular counselor as Lise Auer changed her work schedule and cannot have sessions with [place] often enough.     Visit Diagnosis:    ICD-10-CM   1. Schizoaffective disorder, bipolar type ([place])  F25.0   2. Binge eating disorder  F50.81   3. Chronic post-traumatic stress disorder (PTSD)  F43.12     Past Psychiatric History: Please see intake H&P.  Past Medical History:  Past Medical History:  Diagnosis Date  . Anxiety   . Arthritis    "all over" (04/10/2016)  . Asthma   . Cardiac arrest ([place]) 09/08/2016   PEA  . Carotid artery stenosis    1-39% bilateral by dopplers 11/2016  . Chronic bronchitis ([place])   . Chronic diastolic (congestive) heart failure ([place])   . Chronic kidney disease    "I see a kidney dr." (04/10/2016)  . Cocaine abuse ([place])   . Complication of anesthesia    decreased bp, decreased heart rate  . Depression   . Disorder of nervous system   . Emphysema   . GERD (gastroesophageal reflux disease)   . Heart attack ([place]) 1980s  . History of blood transfusion 1994   "couldn't stop bleeding from my period"  . Hyperlipidemia LDL goal <70   . Hypertension   . Incontinence   . Manic depression ([place])   . On home oxygen therapy    "6L; 24/7" (04/10/2016)  . OSA on CPAP    "wear mask sometimes" (04/10/2016)  . Paranoid ([place])    "sometimes; I'm on RX for it" (04/10/2016)  . Pneumonia    "I've had it several times; haven't had it since 06/2015" (04/10/2016)  . Schizophrenia ([place])   . Seasonal allergies   . Seizures (  [place])    "don't know what kind; last one was ~ 1 yr ago" (04/10/2016)  . Sinus trouble   . Stroke [facility]) 1980s   denies residual on 04/10/2016  . Type II diabetes mellitus ([place])     Past Surgical History:  Procedure Laterality Date  . CESAREAN SECTION  1997  . HERNIA REPAIR    . IR CHOLANGIOGRAM EXISTING TUBE  07/20/2016  . IR PERC CHOLECYSTOSTOMY  05/10/2016  . IR RADIOLOGIST EVAL & MGMT  06/08/2016  . IR RADIOLOGIST EVAL & MGMT  06/29/2016  . IR SINUS/FIST TUBE CHK-NON GI  07/12/2016  . RIGHT/LEFT HEART CATH AND CORONARY ANGIOGRAPHY N/A 06/19/2017    Procedure: RIGHT/LEFT HEART CATH AND CORONARY ANGIOGRAPHY;  Surgeon: Jolaine Artist, MD;  Location: [facility];  Service: Cardiovascular;  Laterality: N/A;  . TIBIA IM NAIL INSERTION Right 07/12/2016   Procedure: INTRAMEDULLARY (IM) NAIL RIGHT TIBIA;  Surgeon: Leandrew Koyanagi, MD;  Location: [facility];  Service: Orthopedics;  Laterality: Right;  . UMBILICAL HERNIA REPAIR  ~ 1963   "that's why I don't have a belly button"  . VAGINAL HYSTERECTOMY      Family Psychiatric History: Reviewed.  Family History:  Family History  Problem Relation Age of Onset  . Cancer Father        prostate  . Cancer Mother        lung  . Depression Mother   . Depression Sister   . Anxiety disorder Sister   . Schizophrenia Sister   . Bipolar disorder Sister   . Depression Sister   . Depression Brother   . Heart failure Other        cousin    Social History:  Social History   Socioeconomic History  . Marital status: Widowed    Spouse name: Not on file  . Number of children: 3  . Years of education: Not on file  . Highest education level: Not on file  Occupational History  . 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 Financial resource strain: Not on file  . Food insecurity    Worry: Sometimes true    Inability: Never true  . Transportation needs    Medical: No    Non-medical: No  Tobacco Use  . Smoking status: Former Smoker    Packs/day: 1.50    Years: 38.00    Pack years: 57.00    Types: Cigarettes    Start date: 03/13/1977    Quit date: 04/10/2016    Years since quitting: 2.4  . Smokeless tobacco: Never Used  Substance and Sexual Activity  . Alcohol use: No    Alcohol/week: 0.0 standard drinks  . Drug use: No    Types: Cocaine    Comment: 04/10/2016 "last used cocaine back in November 2017"  . Sexual activity: Not Currently    Birth control/protection: Surgical  Lifestyle  . Physical activity    Days per week: Not on file    Minutes per  session: Not on file  . Stress: Not on file  Relationships  . Social Herbalist on phone: Not on file    Gets together: Not on file    Attends religious service: Not on file    Active member of club or organization: Not on file    Attends meetings of clubs or organizations: Not on file    Relationship status: Not on file  Other Topics Concern  . Not  on file  Social History Narrative   Has 1 son, [name]   Lives with son and his boyfriend   Her house has ramps and handrails should she ever needs them.    Her mother lives down the street from her and is a good support person in addition to her son.   She drives herself, has private transportation.    Cocaine free since 02/24/16, smoke free since 04/10/16    Allergies:  Allergies  Allergen Reactions  . Hydrocodone-Acetaminophen Shortness Of Breath  . Hydroxyzine Anaphylaxis and Shortness Of Breath  . Latuda [Lurasidone Hcl] Anaphylaxis  . Magnesium-Containing Compounds Anaphylaxis  . Prednisone Anaphylaxis, Swelling and Other (See Comments)    Tongue swelling  . Tramadol Anaphylaxis  . Codeine Nausea And Vomiting  . Sulfa Antibiotics Itching  . Tape Rash    Metabolic Disorder Labs: Lab Results  Component Value Date   HGBA1C 4.9 08/05/2018   MPG 125.5 01/07/2017   MPG 111.15 11/03/2016   No results found for: PROLACTIN Lab Results  Component Value Date   CHOL 127 08/05/2018   TRIG 53 08/05/2018   HDL 62 08/05/2018   CHOLHDL 2.0 08/05/2018   VLDL 38 01/07/2017   LDLCALC 54 08/05/2018   LDLCALC 56 01/07/2017   Lab Results  Component Value Date   TSH 0.563 03/15/2016   TSH 1.060 12/04/2015    Therapeutic Level Labs: No results found for: LITHIUM Lab Results  Component Value Date   VALPROATE 20 (L) 09/09/2016   VALPROATE 46 (L) 07/10/2016   No components found for:  CBMZ  Current Medications: Current Outpatient Medications  Medication Sig Dispense Refill  . albuterol (PROVENTIL) (2.5 MG/3ML) 0.083%  nebulizer solution Take 3 mLs (2.5 mg total) by nebulization every 6 (six) hours as needed for wheezing or shortness of breath. 150 mL 1  . albuterol (VENTOLIN HFA) 108 (90 Base) MCG/ACT inhaler Inhale 2 puffs into the lungs every 6 (six) hours as needed for wheezing or shortness of breath. 18 g 2  . amLODipine (NORVASC) 5 MG tablet Take 5 mg by mouth daily.    [name] aspirin (GOODSENSE ASPIRIN LOW DOSE) 81 MG EC tablet TAKE 1 TABLET (81 MG TOTAL) BY MOUTH DAILY (MORNING). (Patient taking differently: Take 81 mg by mouth daily. ) 90 tablet 3  . atorvastatin (LIPITOR) 80 MG tablet Take 1 tablet (80 mg total) by mouth daily. 90 tablet 0  . budesonide-formoterol (SYMBICORT) 80-4.5 MCG/ACT inhaler Inhale into the lungs.    . cephALEXin (KEFLEX) 250 MG capsule     . cloNIDine (CATAPRES) 0.2 MG tablet Take by mouth.    . colchicine (COLCRYS) 0.6 MG tablet TAKE 1 TABLET (0.6 MG TOTAL) BY MOUTH 2 (TWO) TIMES DAILY. (Patient taking differently: Take 0.6 mg by mouth 2 (two) times daily. ) 180 tablet 3  . cyclobenzaprine (FLEXERIL) 5 MG tablet Take 1 tablet (5 mg total) by mouth 3 (three) times daily as needed for muscle spasms. 60 tablet 0  . diazepam (VALIUM) 10 MG tablet Take 1 tablet (10 mg total) by mouth 3 (three) times daily. 270 tablet 0  . diclofenac (VOLTAREN) 75 MG EC tablet Take by mouth.    . diclofenac sodium (VOLTAREN) 1 % GEL Apply 2 g topically 4 (four) times daily. (Patient taking differently: Apply 2 g topically 4 (four) times daily as needed (pain). ) 100 g 0  . docusate sodium (COLACE) 100 MG capsule Take by mouth.    . escitalopram (LEXAPRO) 20 MG tablet  Take 2 tablets (40 mg total) by mouth daily. 180 tablet 0  . febuxostat (ULORIC) 40 MG tablet Take 1 tablet by mouth daily.    . ferrous sulfate 325 (65 FE) MG tablet Take 1 tablet (325 mg total) by mouth daily with breakfast. 90 tablet 3  . flunisolide (NASALIDE) 25 MCG/ACT (0.025%) SOLN Place 2 sprays into the nose daily. 1 Bottle 2  .  fluticasone (FLONASE) 50 MCG/ACT nasal spray Place into the nose.    [name] Fluticasone-Umeclidin-Vilant (TRELEGY ELLIPTA) 100-62.5-25 MCG/INH AEPB Inhale 1 puff into the lungs daily. 60 each 3  . gabapentin (NEURONTIN) 600 MG tablet TAKE 1 TABLET BY MOUTH TWICE A DAY 180 tablet 1  . glucose blood (PRECISION QID TEST) test strip by Does not apply route.    . hydrochlorothiazide (MICROZIDE) 12.5 MG capsule Take by mouth.    [name] ipratropium (ATROVENT) 0.03 % nasal spray Place into the nose.    . lisdexamfetamine (VYVANSE) 70 MG capsule Take 1 capsule (70 mg total) by mouth daily. 30 capsule 0  . methocarbamol (ROBAXIN) 500 MG tablet Take by mouth.    . metolazone (ZAROXOLYN) 2.5 MG tablet Take 1 tablet (2.5 mg total) by mouth daily. 90 tablet 0  . miconazole (MICONAZOLE 7) 2 % vaginal cream Place 1 Applicatorful vaginally at bedtime. 45 g 0  . montelukast (SINGULAIR) 10 MG tablet TAKE 1 TABLET BY MOUTH EVERYDAY AT BEDTIME 90 tablet 0  . montelukast (SINGULAIR) 4 MG chewable tablet Chew by mouth.    . Multiple Vitamin (MULTIVITAMIN WITH MINERALS) TABS tablet Take 1 tablet by mouth daily.    [name] omeprazole (PRILOSEC) 40 MG capsule Take 1 capsule (40 mg total) by mouth 2 (two) times daily. 180 capsule 3  . oxybutynin (DITROPAN-XL) 10 MG 24 hr tablet Take 1-2 tablets (10-20 mg total) by mouth at bedtime. TAKE 1 TABLET BY MOUTH EVERYDAY AT BEDTIME 60 tablet 1  . polyethylene glycol (MIRALAX / GLYCOLAX) 17 g packet Take by mouth.    . potassium chloride SA (K-DUR) 20 MEQ tablet Take by mouth.    . prazosin (MINIPRESS) 1 MG capsule Take 1 capsule (1 mg total) by mouth at bedtime. 30 capsule 2  . SAPHRIS 2.5 MG SUBL Place 1 tablet (2.5 mg total) under the tongue 3 (three) times daily. 270 tablet 0  . simvastatin (ZOCOR) 40 MG tablet Take by mouth.    . tacrolimus (PROTOPIC) 0.03 % ointment Apply topically 2 (two) times daily. 100 g 0  . terconazole (TERAZOL 3) 0.8 % vaginal cream Apply pea sized amount to clitoris  nightly. 20 g 0  . torsemide (DEMADEX) 20 MG tablet Take 4 tablets (80 mg total) by mouth 2 (two) times daily. 240 tablet 4  . traZODone (DESYREL) 100 MG tablet Take 2 tablets (200 mg total) by mouth at bedtime. 180 tablet 0  . valACYclovir (VALTREX) 500 MG tablet Take by mouth.    . valsartan (DIOVAN) 80 MG tablet Take by mouth.    . zinc oxide 20 % ointment Apply 1 application topically as needed for irritation. 56.7 g 5   No current facility-administered medications for this visit.      Psychiatric Specialty Exam: Review of Systems  Constitutional: Positive for malaise/fatigue.  Musculoskeletal: Positive for back pain.  Psychiatric/Behavioral: Positive for depression.  All other systems reviewed and are negative.   There were no vitals taken for this visit.There is no height or weight on file to calculate BMI.  General Appearance: NA  Eye Contact:  NA  Speech:  Clear and Coherent  Volume:  Normal  Mood:  Anxious and Depressed  Affect:  NA  Thought Process:  Descriptions of Associations: Circumstantial  Orientation:  Full (Time, Place, and Person)  Thought Content: Paranoid Ideation   Suicidal Thoughts:  No  Homicidal Thoughts:  No  Memory:  Immediate;   Good Recent;   Good Remote;   Good  Judgement:  Fair  Insight:  Fair  Psychomotor Activity:  NA  Concentration:  Concentration: Fair  Recall:  [place] of Knowledge: Fair  Language: Fair  Akathisia:  Negative  Handed:  Right  AIMS (if indicated): not done  Assets:  Communication Skills Desire for Improvement Financial Resources/Insurance Resilience  ADL's:  Intact  Cognition: WNL  Sleep:  Good   Screenings: AUDIT     Admission (Discharged) from 12/01/2015 in [place]  Alcohol Use Disorder Identification Test Final Score (AUDIT)  0    GAD-7     Office Visit from 03/21/2018 in [place] for [facility]  Total GAD-7 Score  4    PHQ2-9     Office Visit from 08/30/2018  in [facility] at [place] from 08/27/2018 in Primary Care at [place] from 08/21/2018 in Primary Care at [place] from 08/05/2018 in [place] [facility] [place] from 03/21/2018 in [place] for [facility]  PHQ-2 Total Score  4  2  0  4  2  PHQ-9 Total Score  16  11  -  12  2       Assessment and Plan: 58yo divorced AAF with schizoaffective disorder bipolar type(vs paranoid  schizophrenia),chronicPTSD,remote hx ofcocaineaddiction (clean for 3 years)and bingeeating disorder.[name] a former patient of Dr. [name], then Dr. Frederico Hamman at [facility]. She tried ziprazidone but developed QTC prolongation on it so it was changed to Saphris which she believes is a best medication she has ever been on. [name] also on a high dose (40 mg) escitalopram and Vyvanse for binge eating disorder. Madigan used to weigh over 300 lbs and since starting Vyvanse her binge eating is well controlled and she was able to lose a lot of weight). She continues to take diazepam 10 mg tid and trazodone for sleep (150 mg plus 50 mg in am "for nerves").  She has a very strained relationship with her family - mother in particular. Continues to accuse mother of going though her belongings, stealing from her and she has called police on her few times. She feels depressed about this situation, feel abandoned and feels that family does not "want me to succeed but I will". Erin denies feeling suicidal, no hallucinations reported. She reports being now "in a safe place" but still expresses paranoid fears about her family: "They are trying to kill me". I suggested increase in the dose of Saphris but she resists. [name] compliant with her medications, does not abuse street drugs. She would like to have a regular counselor as Lise Auer changed her work schedule and cannot have sessions with [place] often enough.   Plan: Continue current meds unchanged: Lexapro, trazodone,  Saphris, diazepam and Vyvanse. I will also add low dose of prazosin at S for nightmares. Next appointment in two months. The plan was discussed with patient who had an opportunity to ask questions and these were all answered.Supportive psychotherapy was also provided.I spend 2minutes in phone consultation with the patient.    Stephanie Acre, MD  10/01/2018, 3:28 PM

## 2018-10-02 ENCOUNTER — Ambulatory Visit (HOSPITAL_COMMUNITY): Payer: Medicare HMO | Admitting: Psychiatry

## 2018-10-02 ENCOUNTER — Ambulatory Visit (HOSPITAL_COMMUNITY)
Admission: AD | Admit: 2018-10-02 | Discharge: 2018-10-02 | Disposition: A | Discharge status: Home / Self Care | Payer: Medicare HMO | Attending: Psychiatry | Admitting: Psychiatry

## 2018-10-02 ENCOUNTER — Other Ambulatory Visit: Payer: Self-pay

## 2018-10-02 ENCOUNTER — Encounter (HOSPITAL_COMMUNITY): Payer: Self-pay | Admitting: Emergency Medicine

## 2018-10-02 ENCOUNTER — Emergency Department (HOSPITAL_COMMUNITY)
Admission: EM | Admit: 2018-10-02 | Discharge: 2018-10-05 | Disposition: A | Discharge status: Home / Self Care | Payer: Medicare HMO | Attending: Emergency Medicine | Admitting: Emergency Medicine

## 2018-10-02 DIAGNOSIS — M6281 Muscle weakness (generalized): Secondary | ICD-10-CM | POA: Diagnosis not present

## 2018-10-02 DIAGNOSIS — Z590 Homelessness: Secondary | ICD-10-CM | POA: Insufficient documentation

## 2018-10-02 DIAGNOSIS — Z79899 Other long term (current) drug therapy: Secondary | ICD-10-CM | POA: Diagnosis not present

## 2018-10-02 DIAGNOSIS — Z87891 Personal history of nicotine dependence: Secondary | ICD-10-CM | POA: Insufficient documentation

## 2018-10-02 DIAGNOSIS — E1122 Type 2 diabetes mellitus with diabetic chronic kidney disease: Secondary | ICD-10-CM | POA: Diagnosis not present

## 2018-10-02 DIAGNOSIS — R2681 Unsteadiness on feet: Secondary | ICD-10-CM | POA: Diagnosis not present

## 2018-10-02 DIAGNOSIS — F209 Schizophrenia, unspecified: Secondary | ICD-10-CM | POA: Diagnosis not present

## 2018-10-02 DIAGNOSIS — Z9981 Dependence on supplemental oxygen: Secondary | ICD-10-CM | POA: Insufficient documentation

## 2018-10-02 DIAGNOSIS — F22 Delusional disorders: Secondary | ICD-10-CM | POA: Insufficient documentation

## 2018-10-02 DIAGNOSIS — I130 Hypertensive heart and chronic kidney disease with heart failure and stage 1 through stage 4 chronic kidney disease, or unspecified chronic kidney disease: Secondary | ICD-10-CM | POA: Insufficient documentation

## 2018-10-02 DIAGNOSIS — J439 Emphysema, unspecified: Secondary | ICD-10-CM | POA: Diagnosis not present

## 2018-10-02 DIAGNOSIS — F419 Anxiety disorder, unspecified: Secondary | ICD-10-CM | POA: Diagnosis not present

## 2018-10-02 DIAGNOSIS — I5022 Chronic systolic (congestive) heart failure: Secondary | ICD-10-CM | POA: Insufficient documentation

## 2018-10-02 DIAGNOSIS — Z8674 Personal history of sudden cardiac arrest: Secondary | ICD-10-CM | POA: Insufficient documentation

## 2018-10-02 DIAGNOSIS — N183 Chronic kidney disease, stage 3 (moderate): Secondary | ICD-10-CM | POA: Insufficient documentation

## 2018-10-02 DIAGNOSIS — R45850 Homicidal ideations: Secondary | ICD-10-CM | POA: Insufficient documentation

## 2018-10-02 DIAGNOSIS — I10 Essential (primary) hypertension: Secondary | ICD-10-CM | POA: Diagnosis not present

## 2018-10-02 DIAGNOSIS — Z765 Malingerer [conscious simulation]: Secondary | ICD-10-CM | POA: Diagnosis not present

## 2018-10-02 LAB — CBC
HCT: 30.8 % — ABNORMAL LOW (ref 36.0–46.0)
Hemoglobin: 9.2 g/dL — ABNORMAL LOW (ref 12.0–15.0)
MCH: 27.6 pg (ref 26.0–34.0)
MCHC: 29.9 g/dL — ABNORMAL LOW (ref 30.0–36.0)
MCV: 92.5 fL (ref 80.0–100.0)
Platelets: 328 10*3/uL (ref 150–400)
RBC: 3.33 MIL/uL — ABNORMAL LOW (ref 3.87–5.11)
RDW: 14.2 % (ref 11.5–15.5)
WBC: 5.4 10*3/uL (ref 4.0–10.5)
nRBC: 0 % (ref 0.0–0.2)

## 2018-10-02 LAB — ACETAMINOPHEN LEVEL: Acetaminophen (Tylenol), Serum: <10 ug/mL — ABNORMAL LOW (ref 10–30)

## 2018-10-02 LAB — ETHANOL: Alcohol, Ethyl (B): <10 mg/dL (ref <10)

## 2018-10-02 LAB — COMPREHENSIVE METABOLIC PANEL
ALT: 12 U/L (ref 0–44)
AST: 18 U/L (ref 15–41)
Albumin: 3.8 g/dL (ref 3.5–5.0)
Alkaline Phosphatase: 98 U/L (ref 38–126)
Anion gap: 9 (ref 5–15)
BUN: 14 mg/dL (ref 6–20)
CO2: 28 mmol/L (ref 22–32)
Calcium: 9.7 mg/dL (ref 8.9–10.3)
Chloride: 103 mmol/L (ref 98–111)
Creatinine, Ser: 1.25 mg/dL — ABNORMAL HIGH (ref 0.44–1.00)
GFR calc Af Amer: 55 mL/min — ABNORMAL LOW (ref >60)
GFR calc non Af Amer: 47 mL/min — ABNORMAL LOW (ref >60)
Glucose, Bld: 85 mg/dL (ref 70–99)
Potassium: 3.5 mmol/L (ref 3.5–5.1)
Sodium: 140 mmol/L (ref 135–145)
Total Bilirubin: 0.4 mg/dL (ref 0.3–1.2)
Total Protein: 6.9 g/dL (ref 6.5–8.1)

## 2018-10-02 LAB — SALICYLATE LEVEL: Salicylate Lvl: <7 mg/dL (ref 2.8–30.0)

## 2018-10-02 MED ORDER — SIMVASTATIN 20 MG PO TABS
40.0000 mg | ORAL_TABLET | Freq: Every day | ORAL | Status: DC
  Administered 2018-10-03 – 2018-10-05 (×3): 40 mg via ORAL
  Filled 2018-10-02 (×3): qty 2

## 2018-10-02 MED ORDER — MOMETASONE FURO-FORMOTEROL FUM 100-5 MCG/ACT IN AERO
2.0000 | INHALATION_SPRAY | Freq: Two times a day (BID) | RESPIRATORY_TRACT | Status: DC
  Administered 2018-10-02 – 2018-10-05 (×5): 2 via RESPIRATORY_TRACT
  Filled 2018-10-02: qty 8.8

## 2018-10-02 MED ORDER — HYDROCHLOROTHIAZIDE 12.5 MG PO CAPS
12.5000 mg | ORAL_CAPSULE | Freq: Every day | ORAL | Status: DC
  Administered 2018-10-03 – 2018-10-05 (×3): 12.5 mg via ORAL
  Filled 2018-10-02 (×3): qty 1

## 2018-10-02 MED ORDER — AMLODIPINE BESYLATE 5 MG PO TABS
5.0000 mg | ORAL_TABLET | Freq: Every day | ORAL | Status: DC
  Administered 2018-10-03 – 2018-10-04 (×2): 5 mg via ORAL
  Filled 2018-10-02 (×3): qty 1

## 2018-10-02 MED ORDER — IRBESARTAN 75 MG PO TABS
75.0000 mg | ORAL_TABLET | Freq: Every day | ORAL | Status: DC
  Administered 2018-10-03 – 2018-10-05 (×3): 75 mg via ORAL
  Filled 2018-10-02 (×3): qty 1

## 2018-10-02 MED ORDER — METHOCARBAMOL 500 MG PO TABS
500.0000 mg | ORAL_TABLET | Freq: Three times a day (TID) | ORAL | Status: DC | PRN
  Administered 2018-10-03 (×3): 500 mg via ORAL
  Filled 2018-10-02 (×4): qty 1

## 2018-10-02 MED ORDER — IPRATROPIUM BROMIDE 0.06 % NA SOLN
1.0000 | Freq: Two times a day (BID) | NASAL | Status: DC
  Administered 2018-10-02 – 2018-10-04 (×4): 1 via NASAL
  Filled 2018-10-02: qty 15

## 2018-10-02 MED ORDER — MONTELUKAST SODIUM 4 MG PO CHEW
4.0000 mg | CHEWABLE_TABLET | Freq: Every day | ORAL | Status: DC
  Administered 2018-10-03: 4 mg via ORAL
  Filled 2018-10-02 (×2): qty 1

## 2018-10-02 MED ORDER — CLONIDINE HCL 0.2 MG PO TABS
0.2000 mg | ORAL_TABLET | Freq: Every day | ORAL | Status: DC
  Administered 2018-10-03 – 2018-10-04 (×2): 0.2 mg via ORAL
  Filled 2018-10-02 (×3): qty 1

## 2018-10-02 NOTE — ED Notes (Signed)
Pt allowed to have 1 more 5 minute telephone call. Rules of the unit given to the patient.
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
## 2018-10-02 NOTE — ED Notes (Addendum)
Resp. Here to see pt and give treatment. Pt asked to provide urine sample. Pt aware that beta HCG needed to be done, but pt refused. Pt has no sitter. Charge RN and [facility] aware.

## 2018-10-02 NOTE — ED Notes (Signed)
Social work called. Tiffany [name] is aware of patients disposition plans and needs at this moment.

## 2018-10-02 NOTE — ED Notes (Signed)
Pt taken to [facility]. And helped back into room
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
## 2018-10-02 NOTE — Telephone Encounter (Signed)
Please advise 
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
## 2018-10-02 NOTE — ED Provider Notes (Signed)
1:26 PM Spoke with psych. Pt cleared by psych. Asked if we could have social work see patient to provide resources. RN to ask SW to see. I personally reviewed labs. She has anemia that is at baseline. Otherwise unremarkable. Anticipate d/c soon.   BP (!) 160/92 (BP Location: Right Arm)   Pulse 69   Temp 97.7 F (36.5 C) (Oral)   Resp 18   SpO2 97%     Carlisle Cater, PA-C 10/02/18 1329    Veryl Speak, MD 10/02/18 1534
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
## 2018-10-02 NOTE — ED Notes (Signed)
Per report, pt has used the phone multiple times and continues to yell out of her room. This RN introduced herself to patient, explained rules for the unit and protocol. Pt is drinking a beverage at this moment. [name] requesting to talk with someone because she is supposed to be in court for 2 different cases, she also asks to speak with a lady about her living situation.   Pt agrees to plans and states "I feel safe now, but I know that yall do not care about me. I thought yall said I would get some food right now."   Pt redirected-I will allow one other phone call but later. Her meal tray has been ordered and offered the patient something else in the the mean time. Pt is agreeable to the plan.

## 2018-10-02 NOTE — ED Provider Notes (Signed)
[facility] EMERGENCY DEPARTMENT Provider Note   CSN: [id number] Arrival date & time: 10/02/18  0518     History   Chief Complaint Chief Complaint  Patient presents with  . Homicidal    HPI Tiffany [name] is a 58 y.o. female.     HPI Patient presents to the emergency department with thoughts of harming others.  The patient states that she is been staying in a homeless facility and states that she feels that she wanted to hurt multiple people due to multiple issues with them over the last few weeks.  The patient states that she has not had any definitive plan on how this would occur but she states that she got very angry with them and has been building over short amount of time.  She states that nothing seems to make her condition better or worse.  She states that she has had these types of issues in the past.  The patient denies chest pain, shortness of breath, headache,blurred vision, neck pain, fever, cough, weakness, numbness, dizziness, anorexia, edema, abdominal pain, nausea, vomiting, diarrhea, rash, back pain, dysuria, hematemesis, bloody stool, near syncope, or syncope. Past Medical History:  Diagnosis Date  . Anxiety   . Arthritis    "all over" (04/10/2016)  . Asthma   . Cardiac arrest ([place]) 09/08/2016   PEA  . Carotid artery stenosis    1-39% bilateral by dopplers 11/2016  . Chronic bronchitis ([place])   . Chronic diastolic (congestive) heart failure ([place])   . Chronic kidney disease    "I see a kidney dr." (04/10/2016)  . Cocaine abuse ([place])   . Complication of anesthesia    decreased bp, decreased heart rate  . Depression   . Disorder of nervous system   . Emphysema   . GERD (gastroesophageal reflux disease)   . Heart attack ([place]) 1980s  . History of blood transfusion 1994   "couldn't stop bleeding from my period"  . Hyperlipidemia LDL goal <70   . Hypertension   . Incontinence   . Manic depression ([place])   . On home oxygen therapy    "6L;  24/7" (04/10/2016)  . OSA on CPAP    "wear mask sometimes" (04/10/2016)  . Paranoid ([place])    "sometimes; I'm on RX for it" (04/10/2016)  . Pneumonia    "I've had it several times; haven't had it since 06/2015" (04/10/2016)  . Schizophrenia ([place])   . Seasonal allergies   . Seizures ([place])    "don't know what kind; last one was ~ 1 yr ago" (04/10/2016)  . Sinus trouble   . Stroke [facility]) 1980s   denies residual on 04/10/2016  . Type II diabetes mellitus [facility])     Patient Active Problem List   Diagnosis Date Noted  . Chronic post-traumatic stress disorder (PTSD) 05/27/2018  . Closed displaced fracture of fifth metacarpal bone 03/21/2018  . Difficulty with speech 01/24/2018  . Vaginal discharge 01/03/2018  . AKI (acute kidney injury) ([place]) 11/21/2017  . Encephalopathy 11/21/2017  . Drug abuse ([place]) 11/21/2017  . Frequent falls 10/11/2017  . Binge eating disorder   . Dependence on continuous supplemental oxygen 05/14/2017  . Gout 04/11/2017  . Palliative care encounter 03/12/2017  . CKD (chronic kidney disease) stage 3, GFR 30-59 ml/min (HCC) 12/15/2016  . Carotid artery stenosis   . Osteoarthritis 10/26/2016  . Anoxic brain injury ([place]) 09/08/2016  . Overactive bladder 06/07/2016  . QT prolongation   . OSA and COPD overlap  syndrome ([place])   . Arthritis   . Essential hypertension 03/22/2016  . Cocaine use disorder, severe, in sustained remission ([place]) 12/17/2015  . History of drug abuse in remission ([place]) 11/28/2015  . Chronic diastolic congestive heart failure ([place])   . Chronic respiratory failure with hypoxia and hypercapnia ([place]) 06/22/2015  . Tobacco use disorder 07/22/2014  . COPD (chronic obstructive pulmonary disease) ([place]) 07/08/2014  . Seizure ([place]) 01/04/2013  . Chronic pain syndrome 06/18/2012  . Dyslipidemia 04/24/2011  . Anemia 04/24/2011  . Diabetic neuropathy ([place]) 04/24/2011  . Obstructive sleep apnea 10/18/2010  . Asthma 10/18/2010  . Morbid obesity ([place])  10/18/2010  . Type 2 diabetes mellitus ([place]) 10/18/2010    Past Surgical History:  Procedure Laterality Date  . CESAREAN SECTION  1997  . HERNIA REPAIR    . IR CHOLANGIOGRAM EXISTING TUBE  07/20/2016  . IR PERC CHOLECYSTOSTOMY  05/10/2016  . IR RADIOLOGIST EVAL & MGMT  06/08/2016  . IR RADIOLOGIST EVAL & MGMT  06/29/2016  . IR SINUS/FIST TUBE CHK-NON GI  07/12/2016  . RIGHT/LEFT HEART CATH AND CORONARY ANGIOGRAPHY N/A 06/19/2017   Procedure: RIGHT/LEFT HEART CATH AND CORONARY ANGIOGRAPHY;  Surgeon: Jolaine Artist, MD;  Location: [facility];  Service: Cardiovascular;  Laterality: N/A;  . TIBIA IM NAIL INSERTION Right 07/12/2016   Procedure: INTRAMEDULLARY (IM) NAIL RIGHT TIBIA;  Surgeon: Leandrew Koyanagi, MD;  Location: [facility];  Service: Orthopedics;  Laterality: Right;  . UMBILICAL HERNIA REPAIR  ~ 1963   "that's why I don't have a belly button"  . VAGINAL HYSTERECTOMY       OB History    Gravida  4   Para  4   Term  3   Preterm  1   AB  0   Living  3     SAB  0   TAB  0   Ectopic  0   Multiple  0   Live Births  3            Home Medications    Prior to Admission medications   Medication Sig Start Date End Date Taking? Authorizing Provider  albuterol (PROVENTIL) (2.5 MG/3ML) 0.083% nebulizer solution Take 3 mLs (2.5 mg total) by nebulization every 6 (six) hours as needed for wheezing or shortness of breath. 03/25/18   Martyn Ehrich, NP  albuterol (VENTOLIN HFA) 108 (90 Base) MCG/ACT inhaler Inhale 2 puffs into the lungs every 6 (six) hours as needed for wheezing or shortness of breath. 08/13/18   Lauraine Rinne, NP  amLODipine (NORVASC) 5 MG tablet Take 5 mg by mouth daily.    [provider]  aspirin (GOODSENSE ASPIRIN LOW DOSE) 81 MG EC tablet TAKE 1 TABLET (81 MG TOTAL) BY MOUTH DAILY (MORNING). Patient taking differently: Take 81 mg by mouth daily.  11/12/17   Zenia Resides, MD  atorvastatin (LIPITOR) 80 MG tablet Take 1 tablet (80 mg  total) by mouth daily. 08/05/18   Maximiano Coss, NP  budesonide-formoterol [facility]) 80-4.5 MCG/ACT inhaler Inhale into the lungs. 11/12/08   [provider]  cephALEXin (KEFLEX) 250 MG capsule  08/02/18   [provider]  cloNIDine (CATAPRES) 0.2 MG tablet Take by mouth. 11/12/08   [provider]  colchicine (COLCRYS) 0.6 MG tablet TAKE 1 TABLET (0.6 MG TOTAL) BY MOUTH 2 (TWO) TIMES DAILY. Patient taking differently: Take 0.6 mg by mouth 2 (two) times daily.  10/17/17   Zenia Resides, MD  cyclobenzaprine (FLEXERIL) 5 MG tablet Take 1 tablet (5 mg total) by mouth 3 (three) times daily as needed for muscle spasms. 08/30/18   Maximiano Coss, NP  diazepam (VALIUM) 10 MG tablet Take 1 tablet (10 mg total) by mouth 3 (three) times daily. 10/01/18 12/30/18  Pucilowski, Marchia Bond, MD  diclofenac (VOLTAREN) 75 MG EC tablet Take by mouth. 07/10/18   [provider]  diclofenac sodium (VOLTAREN) 1 % GEL Apply 2 g topically 4 (four) times daily. Patient taking differently: Apply 2 g topically 4 (four) times daily as needed (pain).  01/21/18   Wieters, Hallie C, PA-C  docusate sodium (COLACE) 100 MG capsule Take by mouth. 11/12/08   [provider]  escitalopram (LEXAPRO) 20 MG tablet Take 2 tablets (40 mg total) by mouth daily. 10/01/18 12/30/18  Pucilowski, Marchia Bond, MD  febuxostat (ULORIC) 40 MG tablet Take 1 tablet by mouth daily. 03/27/18   [provider]  ferrous sulfate 325 (65 FE) MG tablet Take 1 tablet (325 mg total) by mouth daily with breakfast. 09/17/18   Maximiano Coss, NP  flunisolide (NASALIDE) 25 MCG/ACT (0.025%) SOLN Place 2 sprays into the nose daily. 06/26/17   McDiarmid, Blane Ohara, MD  fluticasone (FLONASE) 50 MCG/ACT nasal spray Place into the nose. 11/12/08   [provider]  Fluticasone-Umeclidin-Vilant (TRELEGY ELLIPTA) 100-62.5-25 MCG/INH AEPB Inhale 1 puff into the lungs daily. 08/13/18   Lauraine Rinne, NP  gabapentin  (NEURONTIN) 600 MG tablet TAKE 1 TABLET BY MOUTH TWICE A DAY 09/27/18   Maximiano Coss, NP  glucose blood (PRECISION QID TEST) test strip by Does not apply route. 02/07/07   [provider]  hydrochlorothiazide (MICROZIDE) 12.5 MG capsule Take by mouth. 11/12/08   [provider]  ipratropium (ATROVENT) 0.03 % nasal spray Place into the nose. 11/12/08   [provider]  lisdexamfetamine (VYVANSE) 70 MG capsule Take 1 capsule (70 mg total) by mouth daily. 10/01/18 10/31/18  Pucilowski, Marchia Bond, MD  methocarbamol (ROBAXIN) 500 MG tablet Take by mouth. 06/04/18   [provider]  metolazone (ZAROXOLYN) 2.5 MG tablet Take 1 tablet (2.5 mg total) by mouth daily. 04/22/18   Robyn Haber, MD  miconazole (MICONAZOLE 7) 2 % vaginal cream Place 1 Applicatorful vaginally at bedtime. 03/18/18   Larene Pickett, PA-C  montelukast (SINGULAIR) 10 MG tablet TAKE 1 TABLET BY MOUTH EVERYDAY AT BEDTIME 09/16/18   Maximiano Coss, NP  montelukast (SINGULAIR) 4 MG chewable tablet Chew by mouth. 11/12/08   [provider]  Multiple Vitamin (MULTIVITAMIN WITH MINERALS) TABS tablet Take 1 tablet by mouth daily.    [provider]  omeprazole (PRILOSEC) 40 MG capsule Take 1 capsule (40 mg total) by mouth 2 (two) times daily. 10/17/17   Zenia Resides, MD  oxybutynin (DITROPAN-XL) 10 MG 24 hr tablet Take 1-2 tablets (10-20 mg total) by mouth at bedtime. TAKE 1 TABLET BY MOUTH EVERYDAY AT BEDTIME 09/14/18   Wendie Agreste, MD  polyethylene glycol (MIRALAX / GLYCOLAX) 17 g packet Take by mouth. 11/12/08   [provider]  potassium chloride SA (K-DUR) 20 MEQ tablet Take by mouth. 11/12/08   [provider]  prazosin (MINIPRESS) 1 MG capsule Take 1 capsule (1 mg total) by mouth at bedtime. 10/01/18 12/30/18  Pucilowski, Marchia Bond, MD  SAPHRIS 2.5 MG SUBL Place 1 tablet (2.5 mg total) under the tongue 3 (three) times daily. 10/01/18 12/30/18  Pucilowski,  Marchia Bond, MD  simvastatin (ZOCOR) 40 MG  tablet Take by mouth. 11/12/08   [provider]  tacrolimus (PROTOPIC) 0.03 % ointment Apply topically 2 (two) times daily. 08/30/18   Maximiano Coss, NP  terconazole (TERAZOL 3) 0.8 % vaginal cream Apply pea sized amount to clitoris nightly. 04/18/18   Sloan Leiter, MD  torsemide (DEMADEX) 20 MG tablet Take 4 tablets (80 mg total) by mouth 2 (two) times daily. 06/06/17   Mikell, Jeani Sow, MD  traZODone (DESYREL) 100 MG tablet Take 2 tablets (200 mg total) by mouth at bedtime. 10/01/18 12/30/18  Pucilowski, Marchia Bond, MD  valACYclovir (VALTREX) 500 MG tablet Take by mouth. 11/12/08   [provider]  valsartan (DIOVAN) 80 MG tablet Take by mouth. 11/12/08   [provider]  zinc oxide 20 % ointment Apply 1 application topically as needed for irritation. 02/07/18   Zenia Resides, MD    Family History Family History  Problem Relation Age of Onset  . Cancer Father        prostate  . Cancer Mother        lung  . Depression Mother   . Depression Sister   . Anxiety disorder Sister   . Schizophrenia Sister   . Bipolar disorder Sister   . Depression Sister   . Depression Brother   . Heart failure Other        cousin    Social History Social History   Tobacco Use  . Smoking status: Former Smoker    Packs/day: 1.50    Years: 38.00    Pack years: 57.00    Types: Cigarettes    Start date: 03/13/1977    Quit date: 04/10/2016    Years since quitting: 2.4  . Smokeless tobacco: Never Used  Substance Use Topics  . Alcohol use: No    Alcohol/week: 0.0 standard drinks  . Drug use: No    Types: Cocaine    Comment: 04/10/2016 "last used cocaine back in November 2017"     Allergies   Hydrocodone-acetaminophen, Hydroxyzine, Latuda [lurasidone hcl], Magnesium-containing compounds, Prednisone, Tramadol, Codeine, Sulfa antibiotics, and Tape   Review of Systems Review of Systems All other systems negative except as  documented in the HPI. All pertinent positives and negatives as reviewed in the HPI.  Physical Exam Updated Vital Signs BP (!) 150/95 (BP Location: Right Arm)   Pulse 81   Temp 98.7 F (37.1 C) (Oral)   Resp 18   SpO2 97%   Physical Exam Vitals signs and nursing note reviewed.  Constitutional:      General: [name] not in acute distress.    Appearance: [name] well-developed.  HENT:     Head: Normocephalic and atraumatic.  Eyes:     Pupils: Pupils are equal, round, and reactive to light.  Neck:     Musculoskeletal: Normal range of motion and neck supple.  Cardiovascular:     Rate and Rhythm: Normal rate and regular rhythm.     Heart sounds: Normal heart sounds. No murmur. No friction rub. No gallop.   Pulmonary:     Effort: Pulmonary effort is normal. No respiratory distress.     Breath sounds: Normal breath sounds. No wheezing.  Abdominal:     General: Bowel sounds are normal. There is no distension.     Palpations: Abdomen is soft.     Tenderness: There is no abdominal tenderness.  Skin:    General: Skin is warm and dry.     Capillary Refill: Capillary refill takes less  than 2 seconds.     Findings: No erythema or rash.  Neurological:     Mental Status: [name] alert and oriented to person, place, and time.     Motor: No abnormal muscle tone.     Coordination: Coordination normal.  Psychiatric:        Behavior: Behavior normal.        Thought Content: Thought content is paranoid. Thought content is not delusional. Thought content includes homicidal ideation. Thought content does not include suicidal ideation. Thought content does not include homicidal or suicidal plan.        Cognition and Memory: Cognition normal.      ED Treatments / Results  Labs (all labs ordered are listed, but only abnormal results are displayed) Labs Reviewed  COMPREHENSIVE METABOLIC PANEL - Abnormal; Notable for the following components:      Result Value   Creatinine, Ser 1.25 (*)    GFR  calc non Af Amer 47 (*)    GFR calc Af Amer 55 (*)    All other components within normal limits  ACETAMINOPHEN LEVEL - Abnormal; Notable for the following components:   Acetaminophen (Tylenol), Serum <10 (*)    All other components within normal limits  CBC - Abnormal; Notable for the following components:   RBC 3.33 (*)    Hemoglobin 9.2 (*)    HCT 30.8 (*)    MCHC 29.9 (*)    All other components within normal limits  ETHANOL  SALICYLATE LEVEL  RAPID URINE DRUG SCREEN, HOSP PERFORMED    EKG None  Radiology No results found.  Procedures Procedures (including critical care time)  Medications Ordered in ED Medications - No data to display   Initial Impression / Assessment and Plan / ED Course  I have reviewed the triage vital signs and the nursing notes.  Pertinent labs & imaging results that were available during my care of the patient were reviewed by me and considered in my medical decision making (see chart for details).       Patient will need a TTS assessment to further assess her homicidal thoughts.  The patient denies being suicidal at this time.  Thus far she does not have any significant abnormalities that are noted on her laboratory testing that are an acute finding.  Final Clinical Impressions(s) / ED Diagnoses   Final diagnoses:  None    ED Discharge Orders    None       Dalia Heading, PA-C 10/02/18 0919    Isla Pence, MD 10/02/18 1053

## 2018-10-02 NOTE — ED Notes (Signed)
Pt belongings in locker 4 & 5 in purple zone

## 2018-10-02 NOTE — ED Notes (Signed)
Pt's valuables inventoried by security with pt, placed in security office.

## 2018-10-02 NOTE — Telephone Encounter (Signed)
Called number - Unfortunately no answer on two tries. Will attempt again tomorrow. Will contact Tiffany [name], home health NP. Hotel notes that [facility] workers have been in and out to assist Tiffany [name].  Thank you, Tiffany Ruddy, NP

## 2018-10-03 ENCOUNTER — Encounter: Payer: Medicare HMO | Admitting: Obstetrics and Gynecology

## 2018-10-03 ENCOUNTER — Encounter: Payer: Self-pay | Admitting: Obstetrics and Gynecology

## 2018-10-03 DIAGNOSIS — F419 Anxiety disorder, unspecified: Secondary | ICD-10-CM | POA: Diagnosis not present

## 2018-10-03 MED ORDER — MONTELUKAST SODIUM 10 MG PO TABS
10.0000 mg | ORAL_TABLET | Freq: Every day | ORAL | Status: DC
  Administered 2018-10-03 – 2018-10-04 (×2): 10 mg via ORAL
  Filled 2018-10-03 (×3): qty 1

## 2018-10-03 MED ORDER — TORSEMIDE 20 MG PO TABS
80.0000 mg | ORAL_TABLET | Freq: Two times a day (BID) | ORAL | Status: DC
  Administered 2018-10-03 – 2018-10-05 (×5): 80 mg via ORAL
  Filled 2018-10-03 (×5): qty 4

## 2018-10-03 MED ORDER — TRAZODONE HCL 100 MG PO TABS
200.0000 mg | ORAL_TABLET | Freq: Every day | ORAL | Status: DC
  Administered 2018-10-03 – 2018-10-04 (×3): 200 mg via ORAL
  Filled 2018-10-03 (×3): qty 2

## 2018-10-03 MED ORDER — LISDEXAMFETAMINE DIMESYLATE 70 MG PO CAPS
70.0000 mg | ORAL_CAPSULE | Freq: Every day | ORAL | Status: DC
  Administered 2018-10-03 – 2018-10-05 (×3): 70 mg via ORAL
  Filled 2018-10-03 (×3): qty 1

## 2018-10-03 MED ORDER — METOLAZONE 2.5 MG PO TABS
2.5000 mg | ORAL_TABLET | Freq: Every day | ORAL | Status: DC
  Administered 2018-10-03 – 2018-10-05 (×3): 2.5 mg via ORAL
  Filled 2018-10-03 (×3): qty 1

## 2018-10-03 MED ORDER — ATORVASTATIN CALCIUM 80 MG PO TABS
80.0000 mg | ORAL_TABLET | Freq: Every day | ORAL | Status: DC
  Administered 2018-10-03 – 2018-10-05 (×3): 80 mg via ORAL
  Filled 2018-10-03 (×3): qty 1

## 2018-10-03 MED ORDER — FERROUS SULFATE 325 (65 FE) MG PO TABS
325.0000 mg | ORAL_TABLET | Freq: Every day | ORAL | Status: DC
  Administered 2018-10-03 – 2018-10-05 (×3): 325 mg via ORAL
  Filled 2018-10-03 (×3): qty 1

## 2018-10-03 MED ORDER — DIAZEPAM 5 MG PO TABS
10.0000 mg | ORAL_TABLET | Freq: Three times a day (TID) | ORAL | Status: DC
  Administered 2018-10-03 – 2018-10-05 (×7): 10 mg via ORAL
  Filled 2018-10-03 (×8): qty 2

## 2018-10-03 MED ORDER — PANTOPRAZOLE SODIUM 40 MG PO TBEC
40.0000 mg | DELAYED_RELEASE_TABLET | Freq: Every day | ORAL | Status: DC
  Administered 2018-10-03 – 2018-10-05 (×3): 40 mg via ORAL
  Filled 2018-10-03 (×3): qty 1

## 2018-10-03 MED ORDER — ALBUTEROL SULFATE HFA 108 (90 BASE) MCG/ACT IN AERS: 2.0000 | INHALATION_SPRAY | Freq: Four times a day (QID) | RESPIRATORY_TRACT | Status: DC | PRN

## 2018-10-03 MED ORDER — OXYBUTYNIN CHLORIDE ER 10 MG PO TB24
10.0000 mg | ORAL_TABLET | Freq: Every day | ORAL | Status: DC
  Administered 2018-10-03 – 2018-10-05 (×3): 10 mg via ORAL
  Filled 2018-10-03 (×5): qty 1

## 2018-10-03 MED ORDER — ESCITALOPRAM OXALATE 10 MG PO TABS
40.0000 mg | ORAL_TABLET | Freq: Every day | ORAL | Status: DC
  Administered 2018-10-03 – 2018-10-04 (×2): 40 mg via ORAL
  Filled 2018-10-03 (×3): qty 4

## 2018-10-03 MED ORDER — UMECLIDINIUM BROMIDE 62.5 MCG/INH IN AEPB
1.0000 | INHALATION_SPRAY | Freq: Every day | RESPIRATORY_TRACT | Status: DC
  Administered 2018-10-03 – 2018-10-05 (×2): 1 via RESPIRATORY_TRACT
  Filled 2018-10-03: qty 7

## 2018-10-03 MED ORDER — ASENAPINE MALEATE 2.5 MG SL SUBL
2.5000 mg | SUBLINGUAL_TABLET | Freq: Three times a day (TID) | SUBLINGUAL | Status: DC
  Administered 2018-10-03 – 2018-10-05 (×8): 2.5 mg via SUBLINGUAL
  Filled 2018-10-03 (×14): qty 1

## 2018-10-03 MED ORDER — COLCHICINE 0.6 MG PO TABS
0.6000 mg | ORAL_TABLET | Freq: Two times a day (BID) | ORAL | Status: DC
  Administered 2018-10-03 – 2018-10-04 (×3): 0.6 mg via ORAL
  Filled 2018-10-03 (×3): qty 1

## 2018-10-03 MED ORDER — GABAPENTIN 600 MG PO TABS
600.0000 mg | ORAL_TABLET | Freq: Two times a day (BID) | ORAL | Status: DC
  Administered 2018-10-03 – 2018-10-05 (×5): 600 mg via ORAL
  Filled 2018-10-03 (×5): qty 1

## 2018-10-03 MED ORDER — FLUTICASONE FUROATE-VILANTEROL 100-25 MCG/INH IN AEPB
1.0000 | INHALATION_SPRAY | Freq: Every day | RESPIRATORY_TRACT | Status: DC
  Administered 2018-10-03 – 2018-10-05 (×2): 1 via RESPIRATORY_TRACT
  Filled 2018-10-03: qty 28

## 2018-10-03 MED ORDER — PRAZOSIN HCL 1 MG PO CAPS
1.0000 mg | ORAL_CAPSULE | Freq: Every day | ORAL | Status: DC
  Administered 2018-10-03 – 2018-10-05 (×3): 1 mg via ORAL
  Filled 2018-10-03 (×5): qty 1

## 2018-10-03 MED ORDER — FLUTICASONE PROPIONATE 50 MCG/ACT NA SUSP
1.0000 | Freq: Every day | NASAL | Status: DC
  Administered 2018-10-03: 10:00:00 1 via NASAL
  Filled 2018-10-03: qty 16

## 2018-10-03 MED ORDER — FLUTICASONE-UMECLIDIN-VILANT 100-62.5-25 MCG/INH IN AEPB: 1.0000 | INHALATION_SPRAY | Freq: Every day | RESPIRATORY_TRACT | Status: DC

## 2018-10-03 MED ORDER — FEBUXOSTAT 40 MG PO TABS
40.0000 mg | ORAL_TABLET | Freq: Every day | ORAL | Status: DC
  Administered 2018-10-03 – 2018-10-05 (×3): 40 mg via ORAL
  Filled 2018-10-03 (×3): qty 1

## 2018-10-03 NOTE — Progress Notes (Addendum)
12:45pm: [name] spoke with staff at [facility] who stated this patient is not eligible to return to the facility due to her behavior during her short stay there.  10:30am: [name] received return call from [place] at [facility] who stated this patient needed a higher level of care than what any shelter in [facility] can offer.   9am: [name] met with patient at bedside to obtain clarity on her medical and social needs. Patient was asleep upon [name] arriving but was easily awoken. Patient reports she receives a disability check as her income, in the amount of ~$1,500 monthly. Patient reports she has a schizophrenia, bipolar, and major depressive disorder diagnosis that she takes medications for. Patient reports she was not taking her psych meds at [facility] because there were so many other women there watching her. Patient reports she fell recently at the [facility] that she was staying at and her knee has been swollen since. Patient reports she has a metal rod in her leg from surgery years ago that is protruding a pin at her knee. Patient reports having an electric wheelchair in storage but cannot use it due it not having anywhere to store it securely after use. Patient reports she uses a cane to ambulate. Patient requested assistance with securing stable housing for herself and was agreeable to paying her rent from her disability check. Patient's cognition is stable and coherent. Patient and [name] discussed the possibility of her being evaluated by PT and OT here at the hospital to determine her abilities to function independently and she was agreeable to working with them.  [name] to continue following for discharge planning.  Madilyn Fireman, MSW, LCSW-A Clinical Social Worker   Transitions of [facility]   Medical ICU [phone]

## 2018-10-03 NOTE — Evaluation (Signed)
Physical Therapy Evaluation Patient Details Name: Tiffany [name] MRN: [id number] DOB: March 26, 1960 Today's Date: 10/03/2018   History of Present Illness  Pt is a 58 y/o female presenting to ED secondary to homicidal ideation towards others living in homeless shelter. PMH includes schizophrenia, bipolar disorder, HTN, OSA on CPAP, and MDD.   Clinical Impression  Pt presenting with problem above and deficits below. Mobility limited as pt with cramping in LLE. Required mod A to perform side stepping at EOB. Pt with notable weakness in BLE. Pt reporting inconsistencies in PLOF and home information during session. Pt also very restless, but unsure of baseline. Feel pt would benefit from SNF level therapies to increase independence with mobility. Will continue to follow acutely.     Follow Up Recommendations SNF;Supervision/Assistance - 24 hour    Equipment Recommendations  Rolling walker with 5" wheels    Recommendations for Other Services       Precautions / Restrictions Precautions Precautions: Fall Restrictions Weight Bearing Restrictions: No      Mobility  Bed Mobility Overal bed mobility: Needs Assistance Bed Mobility: Supine to Sit;Sit to Supine     Supine to sit: Mod assist Sit to supine: Min assist   General bed mobility comments: Mod A for trunk elevation and LE assist to come to sitting. Min A for LE assist for return to supine and end of session.   Transfers Overall transfer level: Needs assistance Equipment used: 1 person hand held assist Transfers: Sit to/from Stand Sit to Stand: Mod assist         General transfer comment: Mod A for lift assist and steadying. PT standing in front of pt and letting pt hold to PT arms.   Ambulation/Gait Ambulation/Gait assistance: Mod assist   Assistive device: 1 person hand held assist       General Gait Details: PT stood in front of pt and had pt hold to PT arms to perform side stepping at EOB. Pt with LE buckling and  requiring mod A for steadying assist. Limited weight shift to LLE secondary to cramp in calf.   Stairs            Wheelchair Mobility    Modified Rankin (Stroke Patients Only)       Balance Overall balance assessment: Needs assistance Sitting-balance support: No upper extremity supported;Feet supported Sitting balance-Leahy Scale: Fair     Standing balance support: Bilateral upper extremity supported;During functional activity Standing balance-Leahy Scale: Poor Standing balance comment: Reliant on BUE support                             Pertinent Vitals/Pain Pain Assessment: Faces Faces Pain Scale: Hurts even more Pain Location: L calf cramp Pain Descriptors / Indicators: Cramping Pain Intervention(s): Limited activity within patient's tolerance;Monitored during session;Repositioned    Home Living Family/patient expects to be discharged to:: Shelter/Homeless                 Additional Comments: Per notes, pt from homeless shelter, however, pt reports she lives at home.     Prior Function Level of Independence: Independent with assistive device(s)         Comments: Pt reports she has a WC that she has been using, however, per notes, she has been using cane and WC has been in storage. Pt very inconsistent with history.      Hand Dominance        Extremity/Trunk Assessment  Upper Extremity Assessment Upper Extremity Assessment: Defer to OT evaluation    Lower Extremity Assessment Lower Extremity Assessment: Generalized weakness;LLE deficits/detail LLE Deficits / Details: Pt reporting L calf cramp which limited ROM.     Cervical / Trunk Assessment Cervical / Trunk Assessment: Kyphotic  Communication   Communication: Expressive difficulties(mumbled speech and soft spoken)  Cognition Arousal/Alertness: Awake/alert Behavior During Therapy: Restless Overall Cognitive Status: No family/caregiver present to determine baseline cognitive  functioning                                 General Comments: Pt very restless during session. Also reporting inconsistent PLOF and home information. Pt with psych history, but unsure of baseline.       General Comments      Exercises     Assessment/Plan    PT Assessment Patient needs continued PT services  PT Problem List Decreased strength;Decreased activity tolerance;Decreased balance;Decreased mobility;Decreased cognition;Decreased knowledge of use of DME;Decreased safety awareness;Decreased knowledge of precautions;Pain       PT Treatment Interventions DME instruction;Stair training;Gait training;Functional mobility training;Therapeutic activities;Balance training;Therapeutic exercise;Patient/family education;Cognitive remediation    PT Goals (Current goals can be found in the Care Plan section)  Acute Rehab PT Goals Patient Stated Goal: to decrease pain in L calf PT Goal Formulation: With patient Time For Goal Achievement: 10/17/18 Potential to Achieve Goals: Good    Frequency Min 2X/week   Barriers to discharge Decreased caregiver support;Inaccessible home environment      Co-evaluation               AM-PAC PT "6 Clicks" Mobility  Outcome Measure Help needed turning from your back to your side while in a flat bed without using bedrails?: A Lot Help needed moving from lying on your back to sitting on the side of a flat bed without using bedrails?: A Lot Help needed moving to and from a bed to a chair (including a wheelchair)?: A Lot Help needed standing up from a chair using your arms (e.g., wheelchair or bedside chair)?: A Lot Help needed to walk in hospital room?: A Lot Help needed climbing 3-5 steps with a railing? : Total 6 Click Score: 11    End of Session Equipment Utilized During Treatment: Gait belt Activity Tolerance: Patient limited by pain Patient left: in bed;with nursing/sitter in room;with call bell/phone within reach Nurse  Communication: Mobility status PT Visit Diagnosis: Unsteadiness on feet (R26.81);Muscle weakness (generalized) (M62.81);Pain;Difficulty in walking, not elsewhere classified (R26.2) Pain - Right/Left: Left Pain - part of body: Leg    Time: 1856-3149 PT Time Calculation (min) (ACUTE ONLY): 14 min   Charges:   PT Evaluation $PT Eval Moderate Complexity: 1 Mod          Tiffany [name], PT, DPT  Acute Rehabilitation Services  Pager: [phone] Office: [phone]   Tiffany [name] 10/03/2018, 1:05 PM

## 2018-10-03 NOTE — Discharge Planning (Signed)
[facility] reviewed evaluation with PT.  PT recommends SNF placement as pt could benefit from rehabilitation level of care.    Clinical Social Work is seeking post-discharge placement for this patient at the following level of care: SNF.

## 2018-10-03 NOTE — ED Notes (Signed)
Breakfast tray ordered 

## 2018-10-03 NOTE — ED Provider Notes (Signed)
68:26 AM 58 year old female was seen on rounds today.  Pt initially presented to the ED for complaints of homicidal ideation.  Currently, the patient is awaiting to be evaluated by PT and OT in hospital to determine her ability to function independently, [name] to continue following for discharge planning.  No overnight events per nursing.  BP 124/75 (BP Location: Left Arm)   Pulse 65   Temp 97.7 F (36.5 C) (Oral)   Resp 16   SpO2 100%      Domenic Moras, PA-C 10/03/18 1003    Isla Pence, MD 10/03/18 1010

## 2018-10-03 NOTE — ED Notes (Addendum)
Called to pts room. Pt very upset that all of her meds have not been ordered. Pt states that no one has done anything for her since she has been here. Gave this RN a list and we went though each one to make sure this RN knows what shes taking. Pt crying. States that she is supposed to be on oxygen at 4L [place] and also a CPAP at night. States that she doesn't have any of her psych meds, anything to help her sleep and her blood pressure medications. Pt made aware that some medication orders placed and she will get them in the am. Will text md about pt status at this time. Pt given ginger ale and graham crackers.

## 2018-10-03 NOTE — ED Notes (Signed)
Pt requested more graham crackers, and ginger ale when vitals taken. Pt was given the same

## 2018-10-03 NOTE — ED Notes (Signed)
Pt remains sleeping without gross agitation noted.  Sitter was relieved last PM and has been reordered for this AM.

## 2018-10-03 NOTE — Discharge Planning (Signed)
EDCM following for disposition needs.  

## 2018-10-03 NOTE — ED Notes (Signed)
Pt O2 hooked back up to 4lpm by n/c

## 2018-10-03 NOTE — ED Notes (Signed)
SW in with patient at this time.  Awakens calm and talkative.

## 2018-10-03 NOTE — Progress Notes (Signed)
Pt awaiting manual review for PASRR screen. 2nd shift [name] uploaded necessary documents to assist with PASRR screen. Pt still awaiting placement at SNF level.

## 2018-10-03 NOTE — ED Notes (Signed)
RT at bedside.

## 2018-10-03 NOTE — Clinical Social Work Placement (Signed)
   CLINICAL SOCIAL WORK PLACEMENT  NOTE  Date:  10/03/2018  Patient Details  Name: Tiffany [name] MRN: [id number] Date of Birth: January 05, 1961  Clinical Social Work is seeking post-discharge placement for this patient at the   level of care (*[name] will initial, date and re-position this form in  chart as items are completed):      Patient/family provided with [facility] Work Department's list of facilities offering this level of care within the geographic area requested by the patient (or if unable, by the patient's family).      Patient/family informed of their freedom to choose among providers that offer the needed level of care, that participate in Medicare, Medicaid or managed care program needed by the patient, have an available bed and are willing to accept the patient.      Patient/family informed of [facility]'s ownership interest in [facility] and [facility], as well as of the fact that they are under no obligation to receive care at these facilities.  PASRR submitted to EDS on       PASRR number received on       Existing PASRR number confirmed on       FL2 transmitted to all facilities in geographic area requested by pt/family on       FL2 transmitted to all facilities within larger geographic area on       Patient informed that his/her managed care company has contracts with or will negotiate with certain facilities, including the following:            Patient/family informed of bed offers received.  Patient chooses bed at       Physician recommends and patient chooses bed at      Patient to be transferred to   on  .  Patient to be transferred to facility by       Patient family notified on   of transfer.  Name of family member notified:        PHYSICIAN       Additional Comment:    _______________________________________________ Laurena Slimmer, RN 10/03/2018, 6:14 PM

## 2018-10-03 NOTE — NC FL2 (Signed)
[place] LEVEL OF CARE SCREENING TOOL     IDENTIFICATION  Patient Name: NOBUKO GSELL Birthdate: [birth year] Sex: female Admission Date (Current Location): 10/02/2018  [facility] and [place] Number:  Herbalist and Address:  [facility] [place], [place] [address], [place], [place] [address]      Provider Number: [id number]  Attending Physician Name and Address:  No att. providers found  Relative Name and Phone Number:  Ivin Booty, sister [phone]    Current Level of Care: SNF Recommended Level of Care: [place] Prior Approval Number:    Date Approved/Denied:   PASRR Number:    Discharge Plan: SNF    Current Diagnoses: Patient Active Problem List   Diagnosis Date Noted  . Chronic post-traumatic stress disorder (PTSD) 05/27/2018  . Closed displaced fracture of fifth metacarpal bone 03/21/2018  . Difficulty with speech 01/24/2018  . Vaginal discharge 01/03/2018  . AKI (acute kidney injury) ([place]) 11/21/2017  . Encephalopathy 11/21/2017  . Drug abuse ([place]) 11/21/2017  . Frequent falls 10/11/2017  . Binge eating disorder   . Dependence on continuous supplemental oxygen 05/14/2017  . Gout 04/11/2017  . Palliative care encounter 03/12/2017  . CKD (chronic kidney disease) stage 3, GFR 30-59 ml/min (HCC) 12/15/2016  . Carotid artery stenosis   . Osteoarthritis 10/26/2016  . Anoxic brain injury ([place]) 09/08/2016  . Overactive bladder 06/07/2016  . QT prolongation   . OSA and COPD overlap syndrome ([place])   . Arthritis   . Essential hypertension 03/22/2016  . Cocaine use disorder, severe, in sustained remission ([place]) 12/17/2015  . History of drug abuse in remission ([place]) 11/28/2015  . Chronic diastolic congestive heart failure ([place])   . Chronic respiratory failure with hypoxia and hypercapnia ([place]) 06/22/2015  . Tobacco use disorder 07/22/2014  . COPD (chronic obstructive pulmonary disease) ([place]) 07/08/2014  . Seizure  ([place]) 01/04/2013  . Chronic pain syndrome 06/18/2012  . Dyslipidemia 04/24/2011  . Anemia 04/24/2011  . Diabetic neuropathy ([place]) 04/24/2011  . Obstructive sleep apnea 10/18/2010  . Asthma 10/18/2010  . Morbid obesity ([place]) 10/18/2010  . Type 2 diabetes mellitus ([place]) 10/18/2010    Orientation RESPIRATION BLADDER Height & Weight     Self, Time, Situation, Place  O2 Continent Weight:   Height:     BEHAVIORAL SYMPTOMS/MOOD NEUROLOGICAL BOWEL NUTRITION STATUS      Continent Diet  AMBULATORY STATUS COMMUNICATION OF NEEDS Skin     Verbally Normal                       Personal Care Assistance Level of Assistance              Functional Limitations Info  Sight, Speech, Hearing Sight Info: Impaired(Patient wears corrective lenses) Hearing Info: Adequate Speech Info: Adequate    SPECIAL CARE FACTORS FREQUENCY  PT (By licensed PT), OT (By licensed OT)     PT Frequency: 5x weekly OT Frequency: 5x weekly            Contractures Contractures Info: Not present    Additional Factors Info  Allergies, Code Status, Psychotropic Code Status Info: Full Code Allergies Info: Hydrocodone-acetaminophen, Hydroxyzine, Latuda, Magnesium-containing Compounds, Prednisone, Tongue swelling, Tramadol Psychotropic Info: 40mg  Lexapro daily, 3 doses 10mg  Valium daily         Current Medications (10/03/2018):  This is the current hospital active medication list Current Facility-Administered Medications  Medication Dose Route Frequency Provider Last Rate Last Dose  .  albuterol (VENTOLIN HFA) 108 (90 Base) MCG/ACT inhaler 2 puff  2 puff Inhalation Q6H PRN Pollina, Gwenyth Allegra, MD      . amLODipine (NORVASC) tablet 5 mg  5 mg Oral Daily Quincy Carnes M, PA-C   5 mg at 10/03/18 1016  . Asenapine Maleate (SAPHRIS) sublingual tablet 2.5 mg  2.5 mg Sublingual TID Orpah Greek, MD   2.5 mg at 10/03/18 1017  . atorvastatin (LIPITOR) tablet 80 mg  80 mg Oral Daily Orpah Greek, MD   80 mg at 10/03/18 1013  . cloNIDine (CATAPRES) tablet 0.2 mg  0.2 mg Oral Daily Larene Pickett, PA-C   0.2 mg at 10/03/18 1014  . colchicine tablet 0.6 mg  0.6 mg Oral BID Orpah Greek, MD   Stopped at 10/03/18 0115  . diazepam (VALIUM) tablet 10 mg  10 mg Oral TID Orpah Greek, MD   10 mg at 10/03/18 1014  . escitalopram (LEXAPRO) tablet 40 mg  40 mg Oral Daily Pollina, Gwenyth Allegra, MD   40 mg at 10/03/18 1015  . febuxostat (ULORIC) tablet 40 mg  40 mg Oral Daily Orpah Greek, MD   40 mg at 10/03/18 1017  . ferrous sulfate tablet 325 mg  325 mg Oral Q breakfast Orpah Greek, MD   325 mg at 10/03/18 1013  . fluticasone (FLONASE) 50 MCG/ACT nasal spray 1 spray  1 spray Each Nare Daily Pollina, Gwenyth Allegra, MD   1 spray at 10/03/18 1018  . fluticasone furoate-vilanterol (BREO ELLIPTA) 100-25 MCG/INH 1 puff  1 puff Inhalation Daily Pollina, Gwenyth Allegra, MD   1 puff at 10/03/18 1018   And  . umeclidinium bromide (INCRUSE ELLIPTA) 62.5 MCG/INH 1 puff  1 puff Inhalation Daily Orpah Greek, MD   1 puff at 10/03/18 1019  . gabapentin (NEURONTIN) tablet 600 mg  600 mg Oral BID Orpah Greek, MD   600 mg at 10/03/18 1015  . hydrochlorothiazide (MICROZIDE) capsule 12.5 mg  12.5 mg Oral Daily Larene Pickett, PA-C   12.5 mg at 10/03/18 1013  . ipratropium (ATROVENT) 0.06 % nasal spray 1 spray  1 spray Nasal BID Larene Pickett, PA-C   1 spray at 10/03/18 1019  . irbesartan (AVAPRO) tablet 75 mg  75 mg Oral Daily Larene Pickett, PA-C   75 mg at 10/03/18 1017  . lisdexamfetamine (VYVANSE) capsule 70 mg  70 mg Oral Daily Orpah Greek, MD   70 mg at 10/03/18 1013  . methocarbamol (ROBAXIN) tablet 500 mg  500 mg Oral Q8H PRN Larene Pickett, PA-C   500 mg at 10/03/18 0259  . metolazone (ZAROXOLYN) tablet 2.5 mg  2.5 mg Oral Daily Pollina, Gwenyth Allegra, MD   2.5 mg at 10/03/18 1016  . mometasone-formoterol (DULERA)  100-5 MCG/ACT inhaler 2 puff  2 puff Inhalation BID Larene Pickett, PA-C   2 puff at 10/03/18 1019  . montelukast (SINGULAIR) chewable tablet 4 mg  4 mg Oral QHS Larene Pickett, PA-C   4 mg at 10/03/18 0257  . montelukast (SINGULAIR) tablet 10 mg  10 mg Oral QHS Orpah Greek, MD   Stopped at 10/03/18 0258  . oxybutynin (DITROPAN-XL) 24 hr tablet 10 mg  10 mg Oral QHS Orpah Greek, MD   10 mg at 10/03/18 0259  . pantoprazole (PROTONIX) EC tablet 40 mg  40 mg Oral Daily Orpah Greek, MD   40 mg at 10/03/18  1013  . prazosin (MINIPRESS) capsule 1 mg  1 mg Oral QHS Pollina, Gwenyth Allegra, MD   1 mg at 10/03/18 0259  . simvastatin (ZOCOR) tablet 40 mg  40 mg Oral Daily Larene Pickett, PA-C   40 mg at 10/03/18 1014  . torsemide (DEMADEX) tablet 80 mg  80 mg Oral BID Orpah Greek, MD   80 mg at 10/03/18 1012  . traZODone (DESYREL) tablet 200 mg  200 mg Oral QHS Orpah Greek, MD   200 mg at 10/03/18 1884   Current Outpatient Medications  Medication Sig Dispense Refill  . albuterol (PROVENTIL) (2.5 MG/3ML) 0.083% nebulizer solution Take 3 mLs (2.5 mg total) by nebulization every 6 (six) hours as needed for wheezing or shortness of breath. 150 mL 1  . albuterol (VENTOLIN HFA) 108 (90 Base) MCG/ACT inhaler Inhale 2 puffs into the lungs every 6 (six) hours as needed for wheezing or shortness of breath. 18 g 2  . amLODipine (NORVASC) 5 MG tablet Take 5 mg by mouth daily.    [name] aspirin (GOODSENSE ASPIRIN LOW DOSE) 81 MG EC tablet TAKE 1 TABLET (81 MG TOTAL) BY MOUTH DAILY (MORNING). (Patient taking differently: Take 81 mg by mouth daily. ) 90 tablet 3  . atorvastatin (LIPITOR) 80 MG tablet Take 1 tablet (80 mg total) by mouth daily. 90 tablet 0  . budesonide-formoterol (SYMBICORT) 80-4.5 MCG/ACT inhaler Inhale into the lungs.    . cephALEXin (KEFLEX) 250 MG capsule     . cloNIDine (CATAPRES) 0.2 MG tablet Take by mouth.    . colchicine (COLCRYS) 0.6 MG tablet  TAKE 1 TABLET (0.6 MG TOTAL) BY MOUTH 2 (TWO) TIMES DAILY. (Patient taking differently: Take 0.6 mg by mouth 2 (two) times daily. ) 180 tablet 3  . cyclobenzaprine (FLEXERIL) 5 MG tablet Take 1 tablet (5 mg total) by mouth 3 (three) times daily as needed for muscle spasms. 60 tablet 0  . diazepam (VALIUM) 10 MG tablet Take 1 tablet (10 mg total) by mouth 3 (three) times daily. 270 tablet 0  . diclofenac (VOLTAREN) 75 MG EC tablet Take by mouth.    . diclofenac sodium (VOLTAREN) 1 % GEL Apply 2 g topically 4 (four) times daily. (Patient taking differently: Apply 2 g topically 4 (four) times daily as needed (pain). ) 100 g 0  . docusate sodium (COLACE) 100 MG capsule Take by mouth.    . escitalopram (LEXAPRO) 20 MG tablet Take 2 tablets (40 mg total) by mouth daily. 180 tablet 0  . febuxostat (ULORIC) 40 MG tablet Take 1 tablet by mouth daily.    . ferrous sulfate 325 (65 FE) MG tablet Take 1 tablet (325 mg total) by mouth daily with breakfast. 90 tablet 3  . flunisolide (NASALIDE) 25 MCG/ACT (0.025%) SOLN Place 2 sprays into the nose daily. 1 Bottle 2  . fluticasone (FLONASE) 50 MCG/ACT nasal spray Place into the nose.    [name] Fluticasone-Umeclidin-Vilant (TRELEGY ELLIPTA) 100-62.5-25 MCG/INH AEPB Inhale 1 puff into the lungs daily. 60 each 3  . gabapentin (NEURONTIN) 600 MG tablet TAKE 1 TABLET BY MOUTH TWICE A DAY 180 tablet 1  . glucose blood (PRECISION QID TEST) test strip by Does not apply route.    . hydrochlorothiazide (MICROZIDE) 12.5 MG capsule Take by mouth.    [name] ipratropium (ATROVENT) 0.03 % nasal spray Place into the nose.    . lisdexamfetamine (VYVANSE) 70 MG capsule Take 1 capsule (70 mg total) by mouth daily. 30 capsule 0  .  methocarbamol (ROBAXIN) 500 MG tablet Take by mouth.    . metolazone (ZAROXOLYN) 2.5 MG tablet Take 1 tablet (2.5 mg total) by mouth daily. 90 tablet 0  . miconazole (MICONAZOLE 7) 2 % vaginal cream Place 1 Applicatorful vaginally at bedtime. 45 g 0  . montelukast  (SINGULAIR) 10 MG tablet TAKE 1 TABLET BY MOUTH EVERYDAY AT BEDTIME 90 tablet 0  . montelukast (SINGULAIR) 4 MG chewable tablet Chew by mouth.    . Multiple Vitamin (MULTIVITAMIN WITH MINERALS) TABS tablet Take 1 tablet by mouth daily.    [name] omeprazole (PRILOSEC) 40 MG capsule Take 1 capsule (40 mg total) by mouth 2 (two) times daily. 180 capsule 3  . oxybutynin (DITROPAN-XL) 10 MG 24 hr tablet Take 1-2 tablets (10-20 mg total) by mouth at bedtime. TAKE 1 TABLET BY MOUTH EVERYDAY AT BEDTIME 60 tablet 1  . polyethylene glycol (MIRALAX / GLYCOLAX) 17 g packet Take by mouth.    . potassium chloride SA (K-DUR) 20 MEQ tablet Take by mouth.    . prazosin (MINIPRESS) 1 MG capsule Take 1 capsule (1 mg total) by mouth at bedtime. 30 capsule 2  . SAPHRIS 2.5 MG SUBL Place 1 tablet (2.5 mg total) under the tongue 3 (three) times daily. 270 tablet 0  . simvastatin (ZOCOR) 40 MG tablet Take by mouth.    . tacrolimus (PROTOPIC) 0.03 % ointment Apply topically 2 (two) times daily. 100 g 0  . terconazole (TERAZOL 3) 0.8 % vaginal cream Apply pea sized amount to clitoris nightly. 20 g 0  . torsemide (DEMADEX) 20 MG tablet Take 4 tablets (80 mg total) by mouth 2 (two) times daily. 240 tablet 4  . traZODone (DESYREL) 100 MG tablet Take 2 tablets (200 mg total) by mouth at bedtime. 180 tablet 0  . valACYclovir (VALTREX) 500 MG tablet Take by mouth.    . valsartan (DIOVAN) 80 MG tablet Take by mouth.    . zinc oxide 20 % ointment Apply 1 application topically as needed for irritation. 56.7 g 5     Discharge Medications: Please see discharge summary for a list of discharge medications.  Relevant Imaging Results:  Relevant Lab Results:   Additional Information SS#: [id number]  Archie Endo, LCSW

## 2018-10-03 NOTE — NC FL2 (Signed)
[place] LEVEL OF CARE SCREENING TOOL     IDENTIFICATION  Patient Name: Tiffany [name] Birthdate: 04-30-1960 Sex: female Admission Date (Current Location): 10/02/2018  [facility] and [place] Number:  Herbalist and Address:  The [place], [place] [address], [place], [place] [address]      Provider Number: [id number]  Attending Physician Name and Address:  No att. providers found  Relative Name and Phone Number:  Ivin Booty, sister [phone]    Current Level of Care: SNF Recommended Level of Care: [place] Prior Approval Number:    Date Approved/Denied:   PASRR Number:    Discharge Plan: SNF    Current Diagnoses: Patient Active Problem List   Diagnosis Date Noted  . Chronic post-traumatic stress disorder (PTSD) 05/27/2018  . Closed displaced fracture of fifth metacarpal bone 03/21/2018  . Difficulty with speech 01/24/2018  . Vaginal discharge 01/03/2018  . AKI (acute kidney injury) ([place]) 11/21/2017  . Encephalopathy 11/21/2017  . Drug abuse ([place]) 11/21/2017  . Frequent falls 10/11/2017  . Binge eating disorder   . Dependence on continuous supplemental oxygen 05/14/2017  . Gout 04/11/2017  . Palliative care encounter 03/12/2017  . CKD (chronic kidney disease) stage 3, GFR 30-59 ml/min (HCC) 12/15/2016  . Carotid artery stenosis   . Osteoarthritis 10/26/2016  . Anoxic brain injury ([place]) 09/08/2016  . Overactive bladder 06/07/2016  . QT prolongation   . OSA and COPD overlap syndrome ([place])   . Arthritis   . Essential hypertension 03/22/2016  . Cocaine use disorder, severe, in sustained remission ([place]) 12/17/2015  . History of drug abuse in remission ([place]) 11/28/2015  . Chronic diastolic congestive heart failure ([place])   . Chronic respiratory failure with hypoxia and hypercapnia ([name]) 06/22/2015  . Tobacco use disorder 07/22/2014  . COPD (chronic obstructive pulmonary disease) ([place]) 07/08/2014  . Seizure  ([place]) 01/04/2013  . Chronic pain syndrome 06/18/2012  . Dyslipidemia 04/24/2011  . Anemia 04/24/2011  . Diabetic neuropathy ([place]) 04/24/2011  . Obstructive sleep apnea 10/18/2010  . Asthma 10/18/2010  . Morbid obesity ([place]) 10/18/2010  . Type 2 diabetes mellitus ([place]) 10/18/2010    Orientation RESPIRATION BLADDER Height & Weight     Self, Time, Situation, Place  O2 Continent Weight:   Height:     BEHAVIORAL SYMPTOMS/MOOD NEUROLOGICAL BOWEL NUTRITION STATUS  [place]   Continent Diet  AMBULATORY STATUS COMMUNICATION OF NEEDS Skin   Limited Assist Verbally Normal                       Personal Care Assistance Level of Assistance  Bathing, Dressing, Feeding Bathing Assistance: Limited assistance Feeding assistance: Independent Dressing Assistance: Limited assistance Total Care Assistance: Limited assistance   Functional Limitations Info  Sight, Hearing, Speech Sight Info: Impaired(Patient wears corrective lenses) Hearing Info: Adequate Speech Info: Adequate    SPECIAL CARE FACTORS FREQUENCY  PT (By licensed PT), OT (By licensed OT)     PT Frequency: 5x weekly OT Frequency: 5x weekly            Contractures Contractures Info: Not present    Additional Factors Info  Code Status, Allergies, Psychotropic Code Status Info: Full Code Allergies Info: Hydrocodone-acetaminophen, Hydroxyzine, Latuda, Magnesium-containing Compounds, Prednisone, Tongue swelling, Tramadol Psychotropic Info: 40 mg Lexapro daily, 3 doses of 10mg  Valium daily         Current Medications (10/03/2018):  This is the current hospital active medication list Current Facility-Administered Medications  Medication Dose Route Frequency Provider Last Rate Last Dose  . albuterol (VENTOLIN HFA) 108 (90 Base) MCG/ACT inhaler 2 puff  2 puff Inhalation Q6H PRN Pollina, Gwenyth Allegra, MD      . amLODipine (NORVASC) tablet 5 mg  5 mg Oral Daily Quincy Carnes M, PA-C   5 mg at 10/03/18 1016  . Asenapine  Maleate (SAPHRIS) sublingual tablet 2.5 mg  2.5 mg Sublingual TID Orpah Greek, MD   2.5 mg at 10/03/18 1704  . atorvastatin (LIPITOR) tablet 80 mg  80 mg Oral Daily Orpah Greek, MD   80 mg at 10/03/18 1013  . cloNIDine (CATAPRES) tablet 0.2 mg  0.2 mg Oral Daily Larene Pickett, PA-C   0.2 mg at 10/03/18 1014  . colchicine tablet 0.6 mg  0.6 mg Oral BID Orpah Greek, MD   Stopped at 10/03/18 0115  . diazepam (VALIUM) tablet 10 mg  10 mg Oral TID Orpah Greek, MD   10 mg at 10/03/18 1704  . escitalopram (LEXAPRO) tablet 40 mg  40 mg Oral Daily Pollina, Gwenyth Allegra, MD   40 mg at 10/03/18 1015  . febuxostat (ULORIC) tablet 40 mg  40 mg Oral Daily Orpah Greek, MD   40 mg at 10/03/18 1017  . ferrous sulfate tablet 325 mg  325 mg Oral Q breakfast Orpah Greek, MD   325 mg at 10/03/18 1013  . fluticasone (FLONASE) 50 MCG/ACT nasal spray 1 spray  1 spray Each Nare Daily Pollina, Gwenyth Allegra, MD   1 spray at 10/03/18 1018  . fluticasone furoate-vilanterol (BREO ELLIPTA) 100-25 MCG/INH 1 puff  1 puff Inhalation Daily Pollina, Gwenyth Allegra, MD   1 puff at 10/03/18 1018   And  . umeclidinium bromide (INCRUSE ELLIPTA) 62.5 MCG/INH 1 puff  1 puff Inhalation Daily Orpah Greek, MD   1 puff at 10/03/18 1019  . gabapentin (NEURONTIN) tablet 600 mg  600 mg Oral BID Orpah Greek, MD   600 mg at 10/03/18 1015  . hydrochlorothiazide (MICROZIDE) capsule 12.5 mg  12.5 mg Oral Daily Larene Pickett, PA-C   12.5 mg at 10/03/18 1013  . ipratropium (ATROVENT) 0.06 % nasal spray 1 spray  1 spray Nasal BID Larene Pickett, PA-C   1 spray at 10/03/18 1019  . irbesartan (AVAPRO) tablet 75 mg  75 mg Oral Daily Larene Pickett, PA-C   75 mg at 10/03/18 1017  . lisdexamfetamine (VYVANSE) capsule 70 mg  70 mg Oral Daily Orpah Greek, MD   70 mg at 10/03/18 1013  . methocarbamol (ROBAXIN) tablet 500 mg  500 mg Oral Q8H PRN Larene Pickett, PA-C   500 mg at 10/03/18 0259  . metolazone (ZAROXOLYN) tablet 2.5 mg  2.5 mg Oral Daily Pollina, Gwenyth Allegra, MD   2.5 mg at 10/03/18 1016  . mometasone-formoterol (DULERA) 100-5 MCG/ACT inhaler 2 puff  2 puff Inhalation BID Larene Pickett, PA-C   2 puff at 10/03/18 1019  . montelukast (SINGULAIR) chewable tablet 4 mg  4 mg Oral QHS Larene Pickett, PA-C   4 mg at 10/03/18 0257  . montelukast (SINGULAIR) tablet 10 mg  10 mg Oral QHS Orpah Greek, MD   Stopped at 10/03/18 0258  . oxybutynin (DITROPAN-XL) 24 hr tablet 10 mg  10 mg Oral QHS Orpah Greek, MD   10 mg at 10/03/18 0259  . pantoprazole (PROTONIX) EC tablet 40 mg  40 mg Oral  Daily Orpah Greek, MD   40 mg at 10/03/18 1013  . prazosin (MINIPRESS) capsule 1 mg  1 mg Oral QHS Pollina, Gwenyth Allegra, MD   1 mg at 10/03/18 0259  . simvastatin (ZOCOR) tablet 40 mg  40 mg Oral Daily Larene Pickett, PA-C   40 mg at 10/03/18 1014  . torsemide (DEMADEX) tablet 80 mg  80 mg Oral BID Orpah Greek, MD   80 mg at 10/03/18 1706  . traZODone (DESYREL) tablet 200 mg  200 mg Oral QHS Orpah Greek, MD   200 mg at 10/03/18 0932   Current Outpatient Medications  Medication Sig Dispense Refill  . albuterol (PROVENTIL) (2.5 MG/3ML) 0.083% nebulizer solution Take 3 mLs (2.5 mg total) by nebulization every 6 (six) hours as needed for wheezing or shortness of breath. 150 mL 1  . albuterol (VENTOLIN HFA) 108 (90 Base) MCG/ACT inhaler Inhale 2 puffs into the lungs every 6 (six) hours as needed for wheezing or shortness of breath. 18 g 2  . amLODipine (NORVASC) 5 MG tablet Take 5 mg by mouth daily.    [name] aspirin (GOODSENSE ASPIRIN LOW DOSE) 81 MG EC tablet TAKE 1 TABLET (81 MG TOTAL) BY MOUTH DAILY (MORNING). (Patient taking differently: Take 81 mg by mouth daily. ) 90 tablet 3  . atorvastatin (LIPITOR) 80 MG tablet Take 1 tablet (80 mg total) by mouth daily. 90 tablet 0  . budesonide-formoterol (SYMBICORT)  80-4.5 MCG/ACT inhaler Inhale into the lungs.    . cephALEXin (KEFLEX) 250 MG capsule     . cloNIDine (CATAPRES) 0.2 MG tablet Take by mouth.    . colchicine (COLCRYS) 0.6 MG tablet TAKE 1 TABLET (0.6 MG TOTAL) BY MOUTH 2 (TWO) TIMES DAILY. (Patient taking differently: Take 0.6 mg by mouth 2 (two) times daily. ) 180 tablet 3  . cyclobenzaprine (FLEXERIL) 5 MG tablet Take 1 tablet (5 mg total) by mouth 3 (three) times daily as needed for muscle spasms. 60 tablet 0  . diazepam (VALIUM) 10 MG tablet Take 1 tablet (10 mg total) by mouth 3 (three) times daily. 270 tablet 0  . diclofenac (VOLTAREN) 75 MG EC tablet Take by mouth.    . diclofenac sodium (VOLTAREN) 1 % GEL Apply 2 g topically 4 (four) times daily. (Patient taking differently: Apply 2 g topically 4 (four) times daily as needed (pain). ) 100 g 0  . docusate sodium (COLACE) 100 MG capsule Take by mouth.    . escitalopram (LEXAPRO) 20 MG tablet Take 2 tablets (40 mg total) by mouth daily. 180 tablet 0  . febuxostat (ULORIC) 40 MG tablet Take 1 tablet by mouth daily.    . ferrous sulfate 325 (65 FE) MG tablet Take 1 tablet (325 mg total) by mouth daily with breakfast. 90 tablet 3  . flunisolide (NASALIDE) 25 MCG/ACT (0.025%) SOLN Place 2 sprays into the nose daily. 1 Bottle 2  . fluticasone (FLONASE) 50 MCG/ACT nasal spray Place into the nose.    [name] Fluticasone-Umeclidin-Vilant (TRELEGY ELLIPTA) 100-62.5-25 MCG/INH AEPB Inhale 1 puff into the lungs daily. 60 each 3  . gabapentin (NEURONTIN) 600 MG tablet TAKE 1 TABLET BY MOUTH TWICE A DAY 180 tablet 1  . glucose blood (PRECISION QID TEST) test strip by Does not apply route.    . hydrochlorothiazide (MICROZIDE) 12.5 MG capsule Take by mouth.    [name] ipratropium (ATROVENT) 0.03 % nasal spray Place into the nose.    . lisdexamfetamine (VYVANSE) 70 MG capsule Take  1 capsule (70 mg total) by mouth daily. 30 capsule 0  . methocarbamol (ROBAXIN) 500 MG tablet Take by mouth.    . metolazone (ZAROXOLYN)  2.5 MG tablet Take 1 tablet (2.5 mg total) by mouth daily. 90 tablet 0  . miconazole (MICONAZOLE 7) 2 % vaginal cream Place 1 Applicatorful vaginally at bedtime. 45 g 0  . montelukast (SINGULAIR) 10 MG tablet TAKE 1 TABLET BY MOUTH EVERYDAY AT BEDTIME 90 tablet 0  . montelukast (SINGULAIR) 4 MG chewable tablet Chew by mouth.    . Multiple Vitamin (MULTIVITAMIN WITH MINERALS) TABS tablet Take 1 tablet by mouth daily.    [name] omeprazole (PRILOSEC) 40 MG capsule Take 1 capsule (40 mg total) by mouth 2 (two) times daily. 180 capsule 3  . oxybutynin (DITROPAN-XL) 10 MG 24 hr tablet Take 1-2 tablets (10-20 mg total) by mouth at bedtime. TAKE 1 TABLET BY MOUTH EVERYDAY AT BEDTIME 60 tablet 1  . polyethylene glycol (MIRALAX / GLYCOLAX) 17 g packet Take by mouth.    . potassium chloride SA (K-DUR) 20 MEQ tablet Take by mouth.    . prazosin (MINIPRESS) 1 MG capsule Take 1 capsule (1 mg total) by mouth at bedtime. 30 capsule 2  . SAPHRIS 2.5 MG SUBL Place 1 tablet (2.5 mg total) under the tongue 3 (three) times daily. 270 tablet 0  . simvastatin (ZOCOR) 40 MG tablet Take by mouth.    . tacrolimus (PROTOPIC) 0.03 % ointment Apply topically 2 (two) times daily. 100 g 0  . terconazole (TERAZOL 3) 0.8 % vaginal cream Apply pea sized amount to clitoris nightly. 20 g 0  . torsemide (DEMADEX) 20 MG tablet Take 4 tablets (80 mg total) by mouth 2 (two) times daily. 240 tablet 4  . traZODone (DESYREL) 100 MG tablet Take 2 tablets (200 mg total) by mouth at bedtime. 180 tablet 0  . valACYclovir (VALTREX) 500 MG tablet Take by mouth.    . valsartan (DIOVAN) 80 MG tablet Take by mouth.    . zinc oxide 20 % ointment Apply 1 application topically as needed for irritation. 56.7 g 5     Discharge Medications: Please see discharge summary for a list of discharge medications.  Relevant Imaging Results:  Relevant Lab Results:   Additional Information SSN: [id number]  [place], LCSW

## 2018-10-04 DIAGNOSIS — J449 Chronic obstructive pulmonary disease, unspecified: Secondary | ICD-10-CM | POA: Diagnosis not present

## 2018-10-04 DIAGNOSIS — J45909 Unspecified asthma, uncomplicated: Secondary | ICD-10-CM | POA: Diagnosis not present

## 2018-10-04 DIAGNOSIS — R062 Wheezing: Secondary | ICD-10-CM | POA: Diagnosis not present

## 2018-10-04 DIAGNOSIS — F419 Anxiety disorder, unspecified: Secondary | ICD-10-CM | POA: Diagnosis not present

## 2018-10-04 DIAGNOSIS — M6281 Muscle weakness (generalized): Secondary | ICD-10-CM | POA: Diagnosis not present

## 2018-10-04 LAB — RAPID URINE DRUG SCREEN, HOSP PERFORMED
Amphetamines: POSITIVE — AB
Barbiturates: NOT DETECTED
Benzodiazepines: POSITIVE — AB
Cocaine: NOT DETECTED
Opiates: NOT DETECTED
Tetrahydrocannabinol: NOT DETECTED

## 2018-10-04 NOTE — ED Notes (Signed)
PT and OT at bedside

## 2018-10-04 NOTE — Progress Notes (Signed)
1730pm: [name] in contact with pt per pts request. [name] engages pt in conversation regarding her social hx and hx of homelessness. [name] actively listens as pt describes her current situation that she is currently facing. Pt expresses that she is opposed to long term placement at SNF but may consider Short term rehab.Pt is apprehensive about placement because of her income benefits. Pt reports receiving $1760 monthly.  [name] explains that the plan currently is short term rehab at SNF level of care.   Pt provides the number for her "disability case worker" Katharine Look [facility] [phone].   Pt states that her goal is to find permanent housing so that her aide can come in and assist her. [name] explained that perhaps permanent housing is something she can pursue during her short term rehab stay.   [name] listened as pt vented for approx. 30 min. Pt difficult to redirect over the phone. Pt explains that her mental health causes her to "act out" and is now suggestive of psych inpatient for her next placement. [name] explained to pt that she has been cleared. [name] concluded conversation.   TOC team will continue to follow pt for any discharge related needs.   [facility]  Clinical Social Worker  Ph: [phone]

## 2018-10-04 NOTE — ED Notes (Signed)
Patient continues to endorse HI-Monique,RN

## 2018-10-04 NOTE — ED Notes (Signed)
Patient continues to ask about using the phone. She has several people in her phone book that she says she wants to call and is not happy with the phone policy.

## 2018-10-04 NOTE — ED Notes (Signed)
Patient given phone call policy for [facility] as a reminder. She insists on making multiple phone calls and having a difficult time understanding why she cannot use nurse phone to make multiple phone calls

## 2018-10-04 NOTE — Progress Notes (Signed)
10/04/18 1814  PT Visit Information  Last PT Received On 10/04/18  Assistance Needed +2 (for safety)  PT/OT/SLP Co-Evaluation/Treatment Yes  Reason for Co-Treatment Necessary to address cognition/behavior during functional activity;For patient/therapist safety;To address functional/ADL transfers  PT goals addressed during session Mobility/safety with mobility;Balance;Proper use of DME  History of Present Illness Pt is a 58 y/o female presenting to ED secondary to homicidal ideation towards others living in homeless shelter. PMH includes schizophrenia, bipolar disorder, HTN, OSA on CPAP, and MDD.   Subjective Data  Patient Stated Goal get a safe place to live  Precautions  Precautions Fall  Restrictions  Weight Bearing Restrictions No  Pain Assessment  Pain Assessment Faces  Faces Pain Scale 2  Pain Location R foot  Pain Descriptors / Indicators Cramping  Pain Intervention(s) Limited activity within patient's tolerance;Monitored during session;Repositioned  Cognition  Arousal/Alertness Awake/alert  Behavior During Therapy Restless  Overall Cognitive Status No family/caregiver present to determine baseline cognitive functioning  General Comments Pt perseverating on medicine management during session, and that she "cannot use the phone" Pt with psych hx. Required constant redirection to task.   Bed Mobility  Overal bed mobility Needs Assistance  Bed Mobility Supine to Sit  Supine to sit Mod assist  General bed mobility comments Mod A for trunk elevation and LE assist to come to sitting.   Transfers  Overall transfer level Needs assistance  Equipment used Straight cane  Transfers Sit to/from Stand  Sit to Stand Min assist;+2 safety/equipment;From elevated surface  General transfer comment min A for lift assist and steadying. +2 for safety.   Ambulation/Gait  Ambulation/Gait assistance Min assist;+2 safety/equipment  Gait Distance (Feet) 10 Feet  Assistive device Straight cane   Gait Pattern/deviations Step-through pattern;Decreased stride length;Trunk flexed  General Gait Details Slow, unsteady gait. Pt with increased tolerance for gait this session, however, did report pain in R foot. Min A for steadying assist.   Gait velocity Decreased  Balance  Overall balance assessment Needs assistance  Sitting-balance support No upper extremity supported;Feet supported  Sitting balance-Leahy Scale Fair  Standing balance support Bilateral upper extremity supported;During functional activity  Standing balance-Leahy Scale Poor  Standing balance comment dependent on at least one UE support, and external assist from therapist  Exercises  Exercises Other exercises  Other Exercises  Other Exercises Manual therapy to R foot to help ease cramping.   PT - End of Session  Activity Tolerance Patient limited by pain  Patient left in bed;with nursing/sitter in room;with call bell/phone within reach (sitting EOB )  Nurse Communication Mobility status   PT - Assessment/Plan  PT Plan Current plan remains appropriate  PT Visit Diagnosis Unsteadiness on feet (R26.81);Muscle weakness (generalized) (M62.81);Pain;Difficulty in walking, not elsewhere classified (R26.2)  Pain - Right/Left Right  Pain - part of body Ankle and joints of foot  PT Frequency (ACUTE ONLY) Min 2X/week  Follow Up Recommendations SNF;Supervision/Assistance - 24 hour  PT equipment Rolling walker with 5" wheels  AM-PAC PT "6 Clicks" Mobility Outcome Measure (Version 2)  Help needed turning from your back to your side while in a flat bed without using bedrails? 3  Help needed moving from lying on your back to sitting on the side of a flat bed without using bedrails? 2  Help needed moving to and from a bed to a chair (including a wheelchair)? 3  Help needed standing up from a chair using your arms (e.g., wheelchair or bedside chair)? 3  Help needed to walk in  hospital room? 3  Help needed climbing 3-5 steps with a  railing?  2  6 Click Score 16  Consider Recommendation of Discharge To: Home with [facility]  PT Goal Progression  Progress towards PT goals Progressing toward goals  Acute Rehab PT Goals  PT Goal Formulation With patient  Time For Goal Achievement 10/17/18  Potential to Achieve Goals Good  PT Time Calculation  PT Start Time (ACUTE ONLY) 1605  PT Stop Time (ACUTE ONLY) 1636  PT Time Calculation (min) (ACUTE ONLY) 31 min  PT General Charges  $$ ACUTE PT VISIT 1 Visit  PT Treatments  $Therapeutic Activity 8-22 mins   Pt progressing towards goals. Pt tolerated increased gait training with min A +2 for safety, however, continues to be unsteady and limited by pain. Pt required continuous redirection to tasks as she was very upset about her medicine management. Feel current recommendations are appropriate. Will continue to follow acutely to maximize functional mobility independence and safety.   Leighton Ruff, PT, DPT  Acute Rehabilitation Services  Pager: [phone] Office: [phone]

## 2018-10-04 NOTE — Progress Notes (Signed)
Unfortunately, at this time the patient does not have any bed offers from any SNF facilities. Patient has been declined at four places due to insurance barriers and behaviors. The patient's homelessness will be a barrier for SNF placement due to lack of plan after rehab.  Madilyn Fireman, MSW, LCSW-A Clinical Social Worker   [facility] [place] [facility]   Medical ICU [phone]

## 2018-10-04 NOTE — ED Notes (Addendum)
Pt yelling from room. Mad that she hasn't got her medications yet. Standing at doorway yelling at the nursing staff. Cursing at this RN. "No one has done a damn thing for me".

## 2018-10-04 NOTE — Progress Notes (Signed)
Patient did not keep her GYN referral appointment for 10/03/2018.  Durene Romans MD Attending Center for [employer])

## 2018-10-04 NOTE — ED Notes (Signed)
Patient is requesting to have the phone to call Orthopedics doctor to see her leg because she says she is unable to walk well.  PT reports using a motorized wheelchair at baseline and also has a cane.

## 2018-10-04 NOTE — ED Notes (Signed)
Sleeping
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
## 2018-10-05 DIAGNOSIS — F419 Anxiety disorder, unspecified: Secondary | ICD-10-CM | POA: Diagnosis not present

## 2018-10-05 MED ORDER — CYCLOBENZAPRINE HCL 10 MG PO TABS: 5.0000 mg | ORAL_TABLET | Freq: Three times a day (TID) | ORAL | Status: DC | PRN

## 2018-10-05 NOTE — ED Notes (Signed)
Cane removed from room - labeled and placed in [place] w/pt's other belongings.

## 2018-10-05 NOTE — ED Notes (Signed)
Pt refused VS  

## 2018-10-05 NOTE — ED Notes (Signed)
Pt sitting on her bed crying. This RN to room to speak with her. Asked her where she was going to go, and pt states "I don't know". "Pt worried that someone will steal her money and belongings if she leaves". Pt states that she will behave if she can stay. Instructed pt that we will not tolerate her behavior any further and she will not abuse the staff. Pt agrees at this time. All belongings returned to original lockers. Md notified.

## 2018-10-05 NOTE — ED Notes (Signed)
Called to pts room. Pt showed me her toes and states  "you see my toes? They have a fungus on them" "Can you get me something for it?"

## 2018-10-05 NOTE — ED Notes (Signed)
All belongings removed from lockers and brought to pt in her room. DC instructions given to pt, but she threw them on the floor. Pt states "I don't have to deal with this shit" "I can go somewhere else" "I thought you were supposed to help me". Explained to pt that we have been helping her-all night. Pt still states that "we did nothing for her".

## 2018-10-05 NOTE — ED Provider Notes (Signed)
Patient is now demanding discharge.  [name] awake alert in no acute distress.  [name] walking around with her cane. [name] not an imminent threat to herself.  She has already been seen by psychiatry and cleared for discharge.    Ripley Fraise, MD 10/05/18 [phone]

## 2018-10-05 NOTE — Discharge Instructions (Addendum)
Substance Abuse Treatment Programs ° °Intensive Outpatient Programs °[facility]     °[address]      °[place], [place]                   °[phone]      ° °[facility] °[address] °[place], [place] °[phone] ° °[facility]     °(Inpatient and outpatient)     °[address]           °[phone]   ° °[facility] °[phone] (Suboxone and Methadone) ° °[address]      °[place], [place] [address]      °[phone]      ° °[address] °[place], [place] °852-3033 ° °[facility] (Outpatient/Inpatient, Chemical)    °(insurance only) [phone]      °       °[facility]Groups & Residential) °[place], [place] °[phone] ° °   °[facility]     °[address]     °[place], [place]      °[phone]      ° °[facility] (for caregivers and family) °[address] °[place], [place] °336-299-4655 ° ° ° ° ° °Residential Treatment Programs °[facility]      °[address], [place], [place] [address]  °[phone]      ° °[facility] °[address] [place], [place] [address] °[phone] ° °[facility]        °[phone]      ° °[facility] °1-800-659-3381 ° °[facility]             °[address]                                         °[place], [place]                                                °877-615-2722 or [phone]                              ° °[facility] [place] °[address] °[place], [address] °1.877.941.8954 ° °[facility]    °[address]      °[place], [place]     °[phone]      ° °[facility] °[address] °[place], [place] °[phone] ° °[facility]   °[address]     °[place], [place] [address]     °[phone]      °Admissions: 8am-3pm M-F ° °[facility] °[address] °[place],  [place] °336-227-7417 ° °[facility]: Residential Program (90 Days)   °[place], [place]      °336-725-8389 or [phone]    ° °[facility] °[place],  °(Walk in Hours over the weekend or by referral) ° °[facility] °[address], [place],  [address] °([phone] ° °Crisis Mobile: [facility]:  1-877-626-1772 (for crisis response 24 hours a day) °[facility] Hotline:      1-800-256-2452 °Outpatient Psychiatry and Counseling ° °[facility]: Mobile Crisis   Management 24 hours:  1-877-626-1772 ° °[facility] sliding scale fee and walk in schedule: M-F 8am-12pm/1pm-3pm °[address]  °[place], [place] [address] °[phone] ° °[facility] °[address] °[place], [place] [address] °[phone] ° °[facility] ([facility])- new patient walk-in appointments available Monday - Friday 8am -3pm.          °[address] °[place], [place] [address] °336-676-6840 or crisis line- 336-676-6905 ° °[facility]/ Intensive Outpatient Therapy Program °[address] °[place], [place] [address] °[phone] ° °[facility]                  °Crisis Services      °[phone]      °[address]     °[place], [place] [address]                ° °[facility]   °[facility] °[phone] °[address] °[place], [place] [address] ° ° °[facility]          °[address]  °[place], [place] [address]       °[phone] ° °[facility] °[address] °[place], [place] [address] °336-292-1510 ° °[facility]    °[address]    °[place], [place] [address]     °[phone]      ° °Parish McKinney, MD     °[address]     °[place] [address]     °[phone]     °  °[facility] °[address] °[place] [address] ° °[facility]     °[address]     °[place], [place]      °336-542-2076      ° °[facility] °Shamsher Ahluwalia, MD °[address] °[place], [place] [address] °[phone] ° °[facility]     °[address]     °[place], [place] [address]     °336-274-1237      ° °[facility] °[address] °[place], [place] [address] °336-854-4450 °Resources for Temporary Residential Assistance/Crisis Centers ° °DAY CENTERS °[facility] °M-F 8am-3pm   °[address] [place], [place] [address]   336-332-0824 °Services include: laundry, barbering, support groups, case management, phone  & computer access, showers, AA/NA mtgs, mental health/substance abuse nurse, job skills class, disability information, VA assistance, spiritual classes, etc.  ° °HOMELESS SHELTERS ° °[facility]     °[facility]   °[address], [place]     °[phone]       °       °[facility] (women and children)       °[address] °[place], [place] [address] °336-275-0820 °[email or web address] for application and process °Application Required ° °[facility]   °[address]    °[place] [address]     °[phone]       °             °[facility] °[address] °, [place] [address] °336.273.5572 °336-235-0363(schedule application appt.) °Application Required ° °[facility] (women only)    °[address]     °[place], [place] [address]     °336-884-1039      °  Intake starts 6pm daily °Need valid ID, SSC, & Police report °[facility] [place] °[address] °[place], [place] °336-881-5420 °Application Required ° °[facility] (men only)     °[address]      °[place], [place]     °[phone]      ° °[facility] °(Pregnant women only) °[address] °[place], [place] °336-275-0206 ° °[facility]      °[address]      °[place], [place] [address]     °[phone]      °       °[facility] °[address] °[place], [place] °336-723-1848 °90 day commitment/SA/Application process ° °[facility](men only)     °[address]     °[place], [place]     °[phone]       °Check-in at 7pm     °       °[facility] °[address] °[place], [place] [address] °336-248-6684 °Men/Women/Women and Children must be there by 7 pm ° °[facility] °[place], [place] °[phone]                ° °
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
## 2018-10-05 NOTE — ED Notes (Signed)
Pt refusing to sit in chair or bed at this time. Pt now standing in doorway yelling at staff and telling this tech "youre a liar".

## 2018-10-05 NOTE — ED Notes (Signed)
Lunch ordered 

## 2018-10-05 NOTE — ED Notes (Addendum)
Pt signed for ALL belongings - multiple labeled belongings bags, valuables incl $1700, home meds, and oxygen concentrator x 2. Pt being escorted to lobby via Advertising copywriter. Security returning pt's hammer. Pt states she has someone she can call for ride from ED.

## 2018-10-05 NOTE — ED Notes (Signed)
Breakfast ordered 

## 2018-10-05 NOTE — ED Notes (Signed)
Security called to room. Pt yelling and cursing at staff. MD to come see pt.

## 2018-10-05 NOTE — Care Management (Signed)
ED CM discussed with ED [name] concerning updates on transitional care plan. [name] will meet with patient to discuss her disposition options.

## 2018-10-05 NOTE — ED Notes (Addendum)
Signature pad not working IT aware. Pt voiced understanding of d/c instructions and questions answered to satisfaction.

## 2018-10-05 NOTE — ED Notes (Signed)
Homicidal/ SW consult

## 2018-10-05 NOTE — ED Notes (Signed)
Breakfast tray ordered 
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
## 2018-10-05 NOTE — ED Notes (Signed)
Pt denies SI/HI or A/V Hallucinations.

## 2018-10-05 NOTE — ED Notes (Addendum)
Pt again yelling in room. "You haven't done a damn thing for me". This RN had been in and out of pts room all night helping her and tending to her requests. Once one is done, pt calls again for something else.  MD texted to come see pt.

## 2018-10-05 NOTE — ED Notes (Addendum)
Pt will not stay in her room. Explained the rules to her multiple times. Pt refuses to acknowledge rules. Sheet with rules given to pt. "I'm not reading that shit". "I don't care what it says"  Pt refusing to drink water. Gave her a gingerale since she didn't get one for snack time.

## 2018-10-05 NOTE — ED Notes (Signed)
Pt woke - ate lunch. Joey, SW, spoke w/pt and gave resources. Pt states she feels much better after receiving her Saphris. Voiced understanding of d/c paperwork and follow up. ALL belongings - multiple labeled belongings bags, home meds, and valuables - returned to pt. Pt looking through meds and valuables to ensure all are present. Pt requesting paper scrubs d/t states "my clothes smell like pee". Pt given burgundy scrub top d/t no blue tops available and blue paper pants.

## 2018-10-05 NOTE — ED Notes (Signed)
Pharmacy called for Minipress and Oxybutynin.

## 2018-10-05 NOTE — ED Notes (Signed)
Pt giving herself a bath at bedside. Requests that the sitter Legrand Como come in and clean her all over "because that is his job and he's not doing anything". Explained to pt that would not be appropriate and that this RN or tech will help her when we are able to. Pt continues to ask for Legrand Como to "clean her everywhere".

## 2018-10-05 NOTE — Progress Notes (Signed)
[name] was informed by [facility] that patient has been involved with psychiatry who had discussed a referral for ACTT services. At this time, per patient report, she is homeless and would be unable to receive ACTT services as they would require a way to meet her consistently in a residence. [name] acknowledged recommendation at this time.  Lamonte Richer, LCSW, [place] Worker II [phone]

## 2018-10-05 NOTE — Progress Notes (Signed)
[name] and RN met with patient to discuss discharge. [name] informed patient she had been medically cleared for discharge. [name] discussed prior search for SNF and informed patient she likely does not qualify for her insurance to cover it as she does not have a rehabbable injury. [name] noted patient understood and agreed to be discharged today. [name] is signing off at this time.  Lamonte Richer, LCSW, [place] Worker II [phone]

## 2018-10-05 NOTE — ED Provider Notes (Signed)
I was called to evaluate patient as she was becoming agitated and disruptive. [name] awaiting placement through social work.  She has multiple complaints including diffuse body cramps and unable to sleep.  She was upset that she does not get her dose of Saphris right on time. She has now been given Saphris.  She requests Flexeril for her muscle cramps which I feel is reasonable.  She refused Robaxin.   Ripley Fraise, MD 10/05/18 [phone]

## 2018-10-06 ENCOUNTER — Encounter (HOSPITAL_COMMUNITY): Payer: Self-pay

## 2018-10-06 ENCOUNTER — Other Ambulatory Visit: Payer: Self-pay

## 2018-10-06 ENCOUNTER — Emergency Department (HOSPITAL_COMMUNITY)
Admission: EM | Admit: 2018-10-06 | Discharge: 2018-10-08 | Disposition: A | Discharge status: Other Acute Inpatient Hospital | Payer: Medicare HMO | Attending: Emergency Medicine | Admitting: Emergency Medicine

## 2018-10-06 DIAGNOSIS — Z79899 Other long term (current) drug therapy: Secondary | ICD-10-CM | POA: Insufficient documentation

## 2018-10-06 DIAGNOSIS — N183 Chronic kidney disease, stage 3 (moderate): Secondary | ICD-10-CM | POA: Diagnosis not present

## 2018-10-06 DIAGNOSIS — E876 Hypokalemia: Secondary | ICD-10-CM | POA: Diagnosis not present

## 2018-10-06 DIAGNOSIS — J449 Chronic obstructive pulmonary disease, unspecified: Secondary | ICD-10-CM | POA: Insufficient documentation

## 2018-10-06 DIAGNOSIS — I5032 Chronic diastolic (congestive) heart failure: Secondary | ICD-10-CM | POA: Diagnosis not present

## 2018-10-06 DIAGNOSIS — F22 Delusional disorders: Secondary | ICD-10-CM | POA: Diagnosis present

## 2018-10-06 DIAGNOSIS — Z87891 Personal history of nicotine dependence: Secondary | ICD-10-CM | POA: Diagnosis not present

## 2018-10-06 DIAGNOSIS — I130 Hypertensive heart and chronic kidney disease with heart failure and stage 1 through stage 4 chronic kidney disease, or unspecified chronic kidney disease: Secondary | ICD-10-CM | POA: Insufficient documentation

## 2018-10-06 DIAGNOSIS — I10 Essential (primary) hypertension: Secondary | ICD-10-CM | POA: Diagnosis not present

## 2018-10-06 DIAGNOSIS — Z20828 Contact with and (suspected) exposure to other viral communicable diseases: Secondary | ICD-10-CM | POA: Insufficient documentation

## 2018-10-06 DIAGNOSIS — R45850 Homicidal ideations: Secondary | ICD-10-CM | POA: Diagnosis not present

## 2018-10-06 DIAGNOSIS — F250 Schizoaffective disorder, bipolar type: Secondary | ICD-10-CM | POA: Diagnosis not present

## 2018-10-06 DIAGNOSIS — I252 Old myocardial infarction: Secondary | ICD-10-CM | POA: Diagnosis not present

## 2018-10-06 DIAGNOSIS — E1122 Type 2 diabetes mellitus with diabetic chronic kidney disease: Secondary | ICD-10-CM | POA: Diagnosis not present

## 2018-10-06 DIAGNOSIS — F4312 Post-traumatic stress disorder, chronic: Secondary | ICD-10-CM | POA: Diagnosis not present

## 2018-10-06 DIAGNOSIS — F4310 Post-traumatic stress disorder, unspecified: Secondary | ICD-10-CM | POA: Diagnosis present

## 2018-10-06 DIAGNOSIS — Z046 Encounter for general psychiatric examination, requested by authority: Secondary | ICD-10-CM | POA: Diagnosis not present

## 2018-10-06 DIAGNOSIS — R4182 Altered mental status, unspecified: Secondary | ICD-10-CM

## 2018-10-06 DIAGNOSIS — Z8673 Personal history of transient ischemic attack (TIA), and cerebral infarction without residual deficits: Secondary | ICD-10-CM | POA: Diagnosis not present

## 2018-10-06 DIAGNOSIS — R918 Other nonspecific abnormal finding of lung field: Secondary | ICD-10-CM | POA: Diagnosis not present

## 2018-10-06 DIAGNOSIS — Z03818 Encounter for observation for suspected exposure to other biological agents ruled out: Secondary | ICD-10-CM | POA: Diagnosis not present

## 2018-10-06 LAB — CBC WITH DIFFERENTIAL/PLATELET
Abs Immature Granulocytes: 0.02 10*3/uL (ref 0.00–0.07)
Basophils Absolute: 0 10*3/uL (ref 0.0–0.1)
Basophils Relative: 0 %
Eosinophils Absolute: 0.1 10*3/uL (ref 0.0–0.5)
Eosinophils Relative: 2 %
HCT: 34.9 % — ABNORMAL LOW (ref 36.0–46.0)
Hemoglobin: 11 g/dL — ABNORMAL LOW (ref 12.0–15.0)
Immature Granulocytes: 0 %
Lymphocytes Relative: 42 %
Lymphs Abs: 2.6 10*3/uL (ref 0.7–4.0)
MCH: 27.8 pg (ref 26.0–34.0)
MCHC: 31.5 g/dL (ref 30.0–36.0)
MCV: 88.4 fL (ref 80.0–100.0)
Monocytes Absolute: 0.5 10*3/uL (ref 0.1–1.0)
Monocytes Relative: 7 %
Neutro Abs: 3 10*3/uL (ref 1.7–7.7)
Neutrophils Relative %: 49 %
Platelets: 365 10*3/uL (ref 150–400)
RBC: 3.95 MIL/uL (ref 3.87–5.11)
RDW: 14 % (ref 11.5–15.5)
WBC: 6.3 10*3/uL (ref 4.0–10.5)
nRBC: 0 % (ref 0.0–0.2)

## 2018-10-06 LAB — RAPID URINE DRUG SCREEN, HOSP PERFORMED
Amphetamines: POSITIVE — AB
Barbiturates: NOT DETECTED
Benzodiazepines: POSITIVE — AB
Cocaine: NOT DETECTED
Opiates: NOT DETECTED
Tetrahydrocannabinol: NOT DETECTED

## 2018-10-06 LAB — I-STAT BETA HCG BLOOD, ED (MC, WL, AP ONLY): I-stat hCG, quantitative: <5 m[IU]/mL (ref <5)

## 2018-10-06 LAB — ETHANOL: Alcohol, Ethyl (B): <10 mg/dL (ref <10)
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
## 2018-10-06 NOTE — ED Notes (Signed)
Lab called to say the CMP was hemolyzed. Tube redrawn. Urine culture sent to the lab.

## 2018-10-06 NOTE — ED Notes (Signed)
Oxygen tubing extension secured for patient. I attempted to draw labs, but the patient is now in the room going through her things and throwing things out. Will come back later.

## 2018-10-06 NOTE — ED Notes (Addendum)
Pt says she needs some more time to throw out some things.
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
## 2018-10-07 ENCOUNTER — Emergency Department (HOSPITAL_COMMUNITY): Payer: Medicare HMO

## 2018-10-07 DIAGNOSIS — F4312 Post-traumatic stress disorder, chronic: Secondary | ICD-10-CM | POA: Diagnosis not present

## 2018-10-07 DIAGNOSIS — F250 Schizoaffective disorder, bipolar type: Secondary | ICD-10-CM | POA: Diagnosis not present

## 2018-10-07 DIAGNOSIS — R45850 Homicidal ideations: Secondary | ICD-10-CM | POA: Diagnosis not present

## 2018-10-07 DIAGNOSIS — R918 Other nonspecific abnormal finding of lung field: Secondary | ICD-10-CM | POA: Diagnosis not present

## 2018-10-07 LAB — BASIC METABOLIC PANEL
Anion gap: 11 (ref 5–15)
BUN: 48 mg/dL — ABNORMAL HIGH (ref 6–20)
CO2: 32 mmol/L (ref 22–32)
Calcium: 8.5 mg/dL — ABNORMAL LOW (ref 8.9–10.3)
Chloride: 95 mmol/L — ABNORMAL LOW (ref 98–111)
Creatinine, Ser: 1.82 mg/dL — ABNORMAL HIGH (ref 0.44–1.00)
GFR calc Af Amer: 35 mL/min — ABNORMAL LOW (ref >60)
GFR calc non Af Amer: 30 mL/min — ABNORMAL LOW (ref >60)
Glucose, Bld: 87 mg/dL (ref 70–99)
Potassium: 2.9 mmol/L — ABNORMAL LOW (ref 3.5–5.1)
Sodium: 138 mmol/L (ref 135–145)

## 2018-10-07 LAB — URINALYSIS, ROUTINE W REFLEX MICROSCOPIC
Bilirubin Urine: NEGATIVE
Glucose, UA: NEGATIVE mg/dL
Hgb urine dipstick: NEGATIVE
Ketones, ur: NEGATIVE mg/dL
Nitrite: NEGATIVE
Protein, ur: NEGATIVE mg/dL
Specific Gravity, Urine: 1.006 (ref 1.005–1.030)
pH: 7 (ref 5.0–8.0)

## 2018-10-07 LAB — SARS CORONAVIRUS 2 BY RT PCR (HOSPITAL ORDER, PERFORMED IN ~~LOC~~ HOSPITAL LAB): SARS Coronavirus 2: NEGATIVE

## 2018-10-07 LAB — COMPREHENSIVE METABOLIC PANEL
ALT: 15 U/L (ref 0–44)
AST: 36 U/L (ref 15–41)
Albumin: 4.6 g/dL (ref 3.5–5.0)
Alkaline Phosphatase: 102 U/L (ref 38–126)
Anion gap: 16 — ABNORMAL HIGH (ref 5–15)
BUN: 51 mg/dL — ABNORMAL HIGH (ref 6–20)
CO2: 37 mmol/L — ABNORMAL HIGH (ref 22–32)
Calcium: 10.2 mg/dL (ref 8.9–10.3)
Chloride: 82 mmol/L — ABNORMAL LOW (ref 98–111)
Creatinine, Ser: 2.18 mg/dL — ABNORMAL HIGH (ref 0.44–1.00)
GFR calc Af Amer: 28 mL/min — ABNORMAL LOW (ref >60)
GFR calc non Af Amer: 24 mL/min — ABNORMAL LOW (ref >60)
Glucose, Bld: 102 mg/dL — ABNORMAL HIGH (ref 70–99)
Potassium: 2.5 mmol/L — CL (ref 3.5–5.1)
Sodium: 135 mmol/L (ref 135–145)
Total Bilirubin: 0.3 mg/dL (ref 0.3–1.2)
Total Protein: 7.8 g/dL (ref 6.5–8.1)

## 2018-10-07 LAB — MAGNESIUM: Magnesium: 2 mg/dL (ref 1.7–2.4)

## 2018-10-07 MED ORDER — PANTOPRAZOLE SODIUM 40 MG PO TBEC
40.0000 mg | DELAYED_RELEASE_TABLET | Freq: Every day | ORAL | Status: DC
  Administered 2018-10-07 – 2018-10-08 (×2): 40 mg via ORAL
  Filled 2018-10-07 (×3): qty 1

## 2018-10-07 MED ORDER — UMECLIDINIUM BROMIDE 62.5 MCG/INH IN AEPB
1.0000 | INHALATION_SPRAY | Freq: Every day | RESPIRATORY_TRACT | Status: DC
  Administered 2018-10-07: 1 via RESPIRATORY_TRACT
  Filled 2018-10-07: qty 7

## 2018-10-07 MED ORDER — FLUTICASONE-UMECLIDIN-VILANT 100-62.5-25 MCG/INH IN AEPB: 1.0000 | INHALATION_SPRAY | Freq: Every day | RESPIRATORY_TRACT | Status: DC

## 2018-10-07 MED ORDER — ASENAPINE MALEATE 2.5 MG SL SUBL
2.5000 mg | SUBLINGUAL_TABLET | Freq: Three times a day (TID) | SUBLINGUAL | Status: DC
  Administered 2018-10-07: 2.5 mg via SUBLINGUAL
  Filled 2018-10-07 (×2): qty 1

## 2018-10-07 MED ORDER — SODIUM CHLORIDE 0.9 % IV SOLN
Freq: Once | INTRAVENOUS | Status: AC
  Administered 2018-10-07: 12:00:00 via INTRAVENOUS

## 2018-10-07 MED ORDER — PRAZOSIN HCL 1 MG PO CAPS
1.0000 mg | ORAL_CAPSULE | Freq: Every day | ORAL | Status: DC
  Administered 2018-10-07 (×2): 1 mg via ORAL
  Filled 2018-10-07 (×2): qty 1

## 2018-10-07 MED ORDER — OXYBUTYNIN CHLORIDE ER 10 MG PO TB24
10.0000 mg | ORAL_TABLET | Freq: Every day | ORAL | Status: DC
  Administered 2018-10-07 (×2): 10 mg via ORAL
  Filled 2018-10-07 (×2): qty 1

## 2018-10-07 MED ORDER — SODIUM CHLORIDE 0.9 % IV BOLUS
1000.0000 mL | Freq: Once | INTRAVENOUS | Status: AC
  Administered 2018-10-07: 1000 mL via INTRAVENOUS

## 2018-10-07 MED ORDER — MONTELUKAST SODIUM 10 MG PO TABS
10.0000 mg | ORAL_TABLET | Freq: Every day | ORAL | Status: DC
  Administered 2018-10-07 (×2): 10 mg via ORAL
  Filled 2018-10-07 (×2): qty 1

## 2018-10-07 MED ORDER — POTASSIUM CHLORIDE CRYS ER 20 MEQ PO TBCR
40.0000 meq | EXTENDED_RELEASE_TABLET | Freq: Two times a day (BID) | ORAL | Status: AC
  Administered 2018-10-07 (×3): 40 meq via ORAL
  Filled 2018-10-07 (×3): qty 2

## 2018-10-07 MED ORDER — DIAZEPAM 5 MG PO TABS
10.0000 mg | ORAL_TABLET | Freq: Three times a day (TID) | ORAL | Status: DC
  Administered 2018-10-07: 10 mg via ORAL
  Filled 2018-10-07 (×2): qty 2

## 2018-10-07 MED ORDER — ALBUTEROL SULFATE HFA 108 (90 BASE) MCG/ACT IN AERS: 2.0000 | INHALATION_SPRAY | Freq: Four times a day (QID) | RESPIRATORY_TRACT | Status: DC | PRN

## 2018-10-07 MED ORDER — ATORVASTATIN CALCIUM 40 MG PO TABS
80.0000 mg | ORAL_TABLET | Freq: Every day | ORAL | Status: DC
  Administered 2018-10-07: 80 mg via ORAL
  Filled 2018-10-07: qty 2

## 2018-10-07 MED ORDER — FLUTICASONE FUROATE-VILANTEROL 100-25 MCG/INH IN AEPB
1.0000 | INHALATION_SPRAY | Freq: Every day | RESPIRATORY_TRACT | Status: DC
  Administered 2018-10-07: 1 via RESPIRATORY_TRACT
  Filled 2018-10-07: qty 28

## 2018-10-07 MED ORDER — ASPIRIN EC 81 MG PO TBEC
81.0000 mg | DELAYED_RELEASE_TABLET | Freq: Every day | ORAL | Status: DC
  Administered 2018-10-07 – 2018-10-08 (×2): 81 mg via ORAL
  Filled 2018-10-07 (×3): qty 1

## 2018-10-07 MED ORDER — TRAZODONE HCL 100 MG PO TABS
200.0000 mg | ORAL_TABLET | Freq: Every day | ORAL | Status: DC
  Administered 2018-10-07 (×2): 200 mg via ORAL
  Filled 2018-10-07 (×2): qty 2

## 2018-10-07 MED ORDER — DIAZEPAM 5 MG PO TABS
10.0000 mg | ORAL_TABLET | Freq: Every day | ORAL | Status: DC
  Administered 2018-10-07: 10 mg via ORAL
  Filled 2018-10-07: qty 2

## 2018-10-07 MED ORDER — POTASSIUM CHLORIDE CRYS ER 20 MEQ PO TBCR: 40.0000 meq | EXTENDED_RELEASE_TABLET | Freq: Once | ORAL | Status: DC

## 2018-10-07 MED ORDER — AMLODIPINE BESYLATE 5 MG PO TABS
5.0000 mg | ORAL_TABLET | Freq: Every day | ORAL | Status: DC
  Administered 2018-10-08: 5 mg via ORAL
  Filled 2018-10-07 (×3): qty 1

## 2018-10-07 MED ORDER — ASENAPINE MALEATE 5 MG SL SUBL
5.0000 mg | SUBLINGUAL_TABLET | Freq: Two times a day (BID) | SUBLINGUAL | Status: DC
  Administered 2018-10-07 – 2018-10-08 (×2): 5 mg via SUBLINGUAL
  Filled 2018-10-07 (×2): qty 1

## 2018-10-07 MED ORDER — GABAPENTIN 300 MG PO CAPS
600.0000 mg | ORAL_CAPSULE | Freq: Two times a day (BID) | ORAL | Status: DC
  Administered 2018-10-07 – 2018-10-08 (×4): 600 mg via ORAL
  Filled 2018-10-07 (×4): qty 2

## 2018-10-07 MED ORDER — POTASSIUM CHLORIDE 10 MEQ/100ML IV SOLN
10.0000 meq | INTRAVENOUS | Status: AC
  Administered 2018-10-07 (×2): 10 meq via INTRAVENOUS
  Filled 2018-10-07 (×2): qty 100

## 2018-10-07 MED ORDER — METOLAZONE 2.5 MG PO TABS
2.5000 mg | ORAL_TABLET | ORAL | Status: DC
  Administered 2018-10-07: 2.5 mg via ORAL
  Filled 2018-10-07 (×2): qty 1

## 2018-10-07 NOTE — ED Notes (Signed)
IVF continue to infuse, tolerating.

## 2018-10-07 NOTE — ED Notes (Signed)
Pt has not been violent on unit. Pt medication compliant. Pt resting comfortably in bed.

## 2018-10-07 NOTE — ED Provider Notes (Signed)
UA reviewed.  Abdnormality noted.  Pt without urinary sx.  Will send off culture.  Hold off on empiric abx   Dorie Rank, MD 10/07/18 1435
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
## 2018-10-07 NOTE — BH Assessment (Signed)
[facility] Assessment Progress Note  Per Buford Dresser, DO, this pt requires psychiatric hospitalization at this time.  Pt presents under IVC initiated by EDP Daleen Bo, MD, and upheld by Dr Mariea Clonts.  At 15:38 Pamala Hurry calls from [facility] to report that pt has been tentatively accepted to their facility by Dr Caren Hazy pending a negative Covid-19 test result.  Please note that an order has been placed for this test with results pending as of this writing.  Pt will go to the [facility].  [name], FNP concurs with this decision.  Pt's nurse, Eustaquio Maize, has been notified; she agrees to fax Covid-19 result to [phone] and to call report to [phone].  Pt is to be transported via [facility].  [facility] stipulates that pt must arrive either before 23:00 tonight, or after 06:00 tomorrow.  [place] Coordinator [phone]

## 2018-10-07 NOTE — ED Provider Notes (Signed)
Assumed care of patient at change of shift, see prior note for complete H&P, briefly- patient is awaiting TTS placement, was found to have hypokalemia and is completing treatment with plan for repeat EKG and BMP. Physical Exam  BP (!) 145/110 (BP Location: Left Arm)   Pulse 71   Temp 98.9 F (37.2 C) (Oral)   Resp 15   SpO2 98%   Physical Exam  ED Course/Procedures     Procedures  MDM  Repeat EKG, QTc now 496, improved from earlier today, also compared to 02/2018 and was 660 at that time.  Repeat BMP, K has improved to 2.9. Case discussed with Dr. Tomi Bamberger, ER attending, patient may continue to await psych placement. Will continue fluids at 125/hr. Patient has order for Kdur 57mEq BID.     Tacy Learn, PA-C 10/08/18 4830    Dorie Rank, MD 10/09/18 [phone]

## 2018-10-07 NOTE — TOC Initial Note (Signed)
Transition of Care [facility]) - Initial/Assessment Note    Patient Details  Name: Tiffany [name] MRN: [id number] Date of Birth: May 02, 1960  Transition of [facility] CM/SW Contact:    Erenest Rasher, RN Phone Number: [phone] 10/07/2018, 6:16 PM  Clinical Narrative:                 Spoke to pt and states she was living in a Motel. States she really would like to stay in her own Senior apt. She does not want to stay in Assisted Living as she would have to give up her check. She states she gets ~$1700 per month. States most of her DME is in storage. Pt states she really does not have anyone she can trust to assist her with financial needs. Patient agreeable to [facility] assistance with location permanent housing.   Expected Discharge Plan: Psychiatric Hospital Barriers to Discharge: Barriers Unresolved (comment)   Patient Goals and CMS Choice Patient states their goals for this hospitalization and ongoing recovery are:: waiting COVID 19 test      Expected Discharge Plan and Services Expected Discharge Plan: [facility] In-house Referral: Clinical Social Work(trilogy, oxygen, scooter, wheelchair, cane, rolling walker) Discharge Planning Services: CM Consult   Living arrangements for the past 2 months: Hotel/Motel                                      Prior Living Arrangements/Services Living arrangements for the past 2 months: Hotel/Motel Lives with:: Self Patient language and need for interpreter reviewed:: Yes Do you feel safe going back to the place where you live?: Yes      Need for Family Participation in Patient Care: Yes (Comment) Care giver support system in place?: Yes (comment) Current home services: DME Criminal Activity/Legal Involvement Pertinent to Current Situation/Hospitalization: No - Comment as needed  Activities of Daily Living Home Assistive Devices/Equipment: Oxygen, Eyeglasses ADL Screening (condition at time of admission) Patient's  cognitive ability adequate to safely complete daily activities?: Yes Is the patient deaf or have difficulty hearing?: No Does the patient have difficulty seeing, even when wearing glasses/contacts?: No Does the patient have difficulty concentrating, remembering, or making decisions?: No Patient able to express need for assistance with ADLs?: Yes Does the patient have difficulty dressing or bathing?: No Independently performs ADLs?: Yes (appropriate for developmental age)  Permission Sought/Granted Permission sought to share information with : Case Freight forwarder, Psychiatrist, Family Supports, PCP Permission granted to share information with : Yes, Verbal Permission Granted              Emotional Assessment Appearance:: Appears older than stated age Attitude/Demeanor/Rapport: Gracious Affect (typically observed): Accepting Orientation: : Oriented to Self, Oriented to Place, Oriented to  Time, Oriented to Situation   Psych Involvement: No (comment)  Admission diagnosis:  homicidial feelings Patient Active Problem List   Diagnosis Date Noted  . Chronic post-traumatic stress disorder (PTSD) 05/27/2018  . Closed displaced fracture of fifth metacarpal bone 03/21/2018  . Difficulty with speech 01/24/2018  . Vaginal discharge 01/03/2018  . AKI (acute kidney injury) ([place]) 11/21/2017  . Encephalopathy 11/21/2017  . Drug abuse ([place]) 11/21/2017  . Frequent falls 10/11/2017  . Binge eating disorder   . Dependence on continuous supplemental oxygen 05/14/2017  . Gout 04/11/2017  . Palliative care encounter 03/12/2017  . CKD (chronic kidney disease) stage 3, GFR 30-59 ml/min (HCC) 12/15/2016  . Carotid  artery stenosis   . Osteoarthritis 10/26/2016  . Anoxic brain injury ([place]) 09/08/2016  . Overactive bladder 06/07/2016  . QT prolongation   . OSA and COPD overlap syndrome ([place])   . Arthritis   . Essential hypertension 03/22/2016  . Cocaine use disorder, severe, in sustained remission ([place])  12/17/2015  . History of drug abuse in remission ([place]) 11/28/2015  . Chronic diastolic congestive heart failure ([place])   . Chronic respiratory failure with hypoxia and hypercapnia ([place]) 06/22/2015  . Tobacco use disorder 07/22/2014  . COPD (chronic obstructive pulmonary disease) ([place]) 07/08/2014  . Seizure ([place]) 01/04/2013  . Chronic pain syndrome 06/18/2012  . Dyslipidemia 04/24/2011  . Anemia 04/24/2011  . Diabetic neuropathy ([place]) 04/24/2011  . Obstructive sleep apnea 10/18/2010  . Asthma 10/18/2010  . Morbid obesity ([place]) 10/18/2010  . Type 2 diabetes mellitus ([place]) 10/18/2010   PCP:  Maximiano Coss, NP Pharmacy:   [id number] [place], [address] [place] [address] Phone: [phone] Fax: [phone]  [id number] [address], [place], [place], [place] [address] Phone: [phone] Fax: [phone]  [id number] [place], [place] - [address] [place] [address] Phone: [phone] Fax: [phone]     Social Determinants of Health (SDOH) Interventions    Readmission Risk Interventions No flowsheet data found.

## 2018-10-07 NOTE — ED Notes (Signed)
Patient's belongings taken and removed from room, IVC papers at nurse's station. Black cell phone and black charger taken, at nurse's station as well. Patient's upper dentures secured in plastic sandwich container per patient. Oxygen machine still at bedside.

## 2018-10-07 NOTE — Progress Notes (Signed)
Received Tiffany [name] at the change of shift asleep in her bed with the sitter at the bedside. Her IV is completed and the saline lkock flushed. Her [place] is in place and suction appropriate. She received her medications without incident. She was repositioned in her bed. She received a snack of her choice and slept throughout the night.
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
## 2018-10-07 NOTE — ED Notes (Signed)
Pharmacy called to tube up medications.

## 2018-10-07 NOTE — ED Provider Notes (Addendum)
Signed out at end of shift by Margarita Mail, PA-C. Patient to ED under IVC for HI, wanting to kill her sister.   Per TTS evaluation, she meets inpatient criteria and they will seek placement.   CMET still pending. Will follow up to obtain result.      Charlann Lange, PA-C 10/07/18 0155    Ward, Delice Bison, DO 10/07/18 0209  ADDENDUM: CMET results abnormal.  Cr. 2.19, BUN 51. Patient has known kidney disease. Per chart review, Cr elevated as high as 3.13 in the past. Torsemide appropriately stopped by Doreen Salvage, PA-C.  Potassium 2.5. IV and PO repletion ordered. Patient made aware. EKG, magnesium pending, IVF's ordered.   Magnesium normal at 2.0. EKG showing prolonged QTc of 583. History of variable QTc in the past.   Plan:  Potassium is being given IV. When complete, EKG and Bmet should be repeated for purposes of medical clearance. Patient care signed out to oncoming provider pending repeat studies.     Charlann Lange, PA-C 10/07/18 [place], Delice Bison, DO 10/07/18 [phone]
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
## 2018-10-08 DIAGNOSIS — M2550 Pain in unspecified joint: Secondary | ICD-10-CM | POA: Diagnosis not present

## 2018-10-08 DIAGNOSIS — N179 Acute kidney failure, unspecified: Secondary | ICD-10-CM | POA: Diagnosis not present

## 2018-10-08 DIAGNOSIS — F250 Schizoaffective disorder, bipolar type: Secondary | ICD-10-CM | POA: Diagnosis not present

## 2018-10-08 DIAGNOSIS — F315 Bipolar disorder, current episode depressed, severe, with psychotic features: Secondary | ICD-10-CM | POA: Diagnosis not present

## 2018-10-08 DIAGNOSIS — I959 Hypotension, unspecified: Secondary | ICD-10-CM | POA: Diagnosis not present

## 2018-10-08 DIAGNOSIS — E876 Hypokalemia: Secondary | ICD-10-CM | POA: Diagnosis not present

## 2018-10-08 DIAGNOSIS — E7800 Pure hypercholesterolemia, unspecified: Secondary | ICD-10-CM | POA: Diagnosis not present

## 2018-10-08 DIAGNOSIS — Z87891 Personal history of nicotine dependence: Secondary | ICD-10-CM | POA: Diagnosis not present

## 2018-10-08 DIAGNOSIS — Z20828 Contact with and (suspected) exposure to other viral communicable diseases: Secondary | ICD-10-CM | POA: Diagnosis not present

## 2018-10-08 DIAGNOSIS — R062 Wheezing: Secondary | ICD-10-CM | POA: Diagnosis not present

## 2018-10-08 DIAGNOSIS — M6281 Muscle weakness (generalized): Secondary | ICD-10-CM | POA: Diagnosis not present

## 2018-10-08 DIAGNOSIS — I252 Old myocardial infarction: Secondary | ICD-10-CM | POA: Diagnosis not present

## 2018-10-08 DIAGNOSIS — K219 Gastro-esophageal reflux disease without esophagitis: Secondary | ICD-10-CM | POA: Diagnosis not present

## 2018-10-08 DIAGNOSIS — Z7401 Bed confinement status: Secondary | ICD-10-CM | POA: Diagnosis not present

## 2018-10-08 DIAGNOSIS — Z79899 Other long term (current) drug therapy: Secondary | ICD-10-CM | POA: Diagnosis not present

## 2018-10-08 DIAGNOSIS — I5032 Chronic diastolic (congestive) heart failure: Secondary | ICD-10-CM | POA: Diagnosis not present

## 2018-10-08 DIAGNOSIS — J45909 Unspecified asthma, uncomplicated: Secondary | ICD-10-CM | POA: Diagnosis not present

## 2018-10-08 DIAGNOSIS — G4733 Obstructive sleep apnea (adult) (pediatric): Secondary | ICD-10-CM | POA: Diagnosis not present

## 2018-10-08 DIAGNOSIS — E1122 Type 2 diabetes mellitus with diabetic chronic kidney disease: Secondary | ICD-10-CM | POA: Diagnosis not present

## 2018-10-08 DIAGNOSIS — Z0189 Encounter for other specified special examinations: Secondary | ICD-10-CM | POA: Diagnosis not present

## 2018-10-08 DIAGNOSIS — M1990 Unspecified osteoarthritis, unspecified site: Secondary | ICD-10-CM | POA: Diagnosis not present

## 2018-10-08 DIAGNOSIS — R45850 Homicidal ideations: Secondary | ICD-10-CM | POA: Diagnosis not present

## 2018-10-08 DIAGNOSIS — I130 Hypertensive heart and chronic kidney disease with heart failure and stage 1 through stage 4 chronic kidney disease, or unspecified chronic kidney disease: Secondary | ICD-10-CM | POA: Diagnosis not present

## 2018-10-08 DIAGNOSIS — I10 Essential (primary) hypertension: Secondary | ICD-10-CM | POA: Diagnosis not present

## 2018-10-08 DIAGNOSIS — J449 Chronic obstructive pulmonary disease, unspecified: Secondary | ICD-10-CM | POA: Diagnosis not present

## 2018-10-08 NOTE — ED Notes (Signed)
Per [employer] department, they will be able to transport O2 condenser, O2 humidifier and other home O2 supplies

## 2018-10-08 NOTE — ED Provider Notes (Signed)
Pt accepted at [facility] by Dr. Caren Hazy.  Pt is stable for transfer.   Isla Pence, MD 10/08/18 1014

## 2018-10-09 ENCOUNTER — Ambulatory Visit (HOSPITAL_COMMUNITY): Payer: Medicare HMO | Admitting: Psychiatry

## 2018-10-09 ENCOUNTER — Other Ambulatory Visit: Payer: Self-pay

## 2018-10-09 MED ORDER — ACCU-PRO PUMP SET/VENT MISC
1.25 | Status: DC
Start: 2018-10-09 — End: 2018-10-09

## 2018-10-09 MED ORDER — Medication
Status: DC
Start: ? — End: 2018-10-09

## 2018-10-09 MED ORDER — NUTREN 1.0 PO
2.00 | ORAL | Status: DC
Start: ? — End: 2018-10-09

## 2018-10-09 MED ORDER — Medication
2.00 | Status: DC
Start: 2018-10-16 — End: 2018-10-09

## 2018-10-09 MED ORDER — PCCA BIOPEPTIDE BASE EX CREA
600.00 | TOPICAL_CREAM | CUTANEOUS | Status: DC
Start: 2018-10-16 — End: 2018-10-09

## 2018-10-09 MED ORDER — COLACE MICROENEMA RE
2.50 | RECTAL | Status: DC
Start: 2018-10-18 — End: 2018-10-09

## 2018-10-09 MED ORDER — NEEDLE (DISP) 23G X 1-1/2" MISC
Status: DC
Start: 2018-10-16 — End: 2018-10-09

## 2018-10-09 MED ORDER — SUREPRESS HI COMPRESS BANDAGE MISC
2.00 | Status: DC
Start: ? — End: 2018-10-09

## 2018-10-09 MED ORDER — GUANABENZ ACETATE 4 MG PO TABS
5.00 | ORAL_TABLET | ORAL | Status: DC
Start: ? — End: 2018-10-09

## 2018-10-09 MED ORDER — BAYER WOMENS 81-300 MG PO TABS
40.00 | ORAL_TABLET | ORAL | Status: DC
Start: 2018-10-17 — End: 2018-10-09

## 2018-10-09 MED ORDER — NEEDLE (REUSABLE) 25G X 1" MISC
40.00 | Status: DC
Start: 2018-10-10 — End: 2018-10-09

## 2018-10-09 MED ORDER — ONE-A-DAY WITHIN PO
15.00 | ORAL | Status: DC
Start: ? — End: 2018-10-09

## 2018-10-09 MED ORDER — HYOSOL/SL 0.125 MG SL SUBL
1.00 | SUBLINGUAL_TABLET | SUBLINGUAL | Status: DC
Start: 2018-10-17 — End: 2018-10-09

## 2018-10-09 MED ORDER — CVS KIDPANT BOYS X-LARGE MISC
80.00 | Status: DC
Start: 2018-10-17 — End: 2018-10-09

## 2018-10-09 MED ORDER — PTS PANELS GLUCOSE TEST VI STRP
40.00 | ORAL_STRIP | Status: DC
Start: 2018-10-17 — End: 2018-10-09

## 2018-10-09 MED ORDER — Medication
80.00 | Status: DC
Start: 2018-10-17 — End: 2018-10-09

## 2018-10-09 MED ORDER — R-TANNIC-S A/D 30-5 MG/5ML PO SUSP
100.00 | ORAL | Status: DC
Start: 2018-10-16 — End: 2018-10-09

## 2018-10-09 MED ORDER — CORTIZONE-5 EX
0.60 | CUTANEOUS | Status: DC
Start: 2018-10-16 — End: 2018-10-09

## 2018-10-09 MED ORDER — PIPERACILLIN SODIUM
5.00 | Status: DC
Start: ? — End: 2018-10-09

## 2018-10-09 MED ORDER — PHENYLEPH-POT GUAIACOLSULF
81.00 | Status: DC
Start: 2018-10-17 — End: 2018-10-09

## 2018-10-09 MED ORDER — ARMOUR THYROID 30 MG PO TABS
1.00 | ORAL_TABLET | ORAL | Status: DC
Start: ? — End: 2018-10-09

## 2018-10-09 MED ORDER — HORIZON PUMP SYRINGE ADD-ON MISC
10.00 | Status: DC
Start: 2018-10-16 — End: 2018-10-09

## 2018-10-09 MED ORDER — PHENYLEPHRINE-GUAIFENESIN 30-400 MG PO CP12
5.00 | ORAL_CAPSULE | ORAL | Status: DC
Start: 2018-10-17 — End: 2018-10-09

## 2018-10-09 MED ORDER — Medication
2.00 | Status: DC
Start: 2018-10-17 — End: 2018-10-09

## 2018-10-09 MED ORDER — DPH-LIDO-ALHYDR-MGHYDR-SIMETH
5.00 | Status: DC
Start: 2018-10-10 — End: 2018-10-09

## 2018-10-09 MED ORDER — ECHINACEA GOLDENSEAL PLUS PO CAPS
10.00 | ORAL_CAPSULE | ORAL | Status: DC
Start: 2018-10-16 — End: 2018-10-09

## 2018-10-09 MED ORDER — GLUCOSAMINE-CHONDROIT-COLLAGEN PO
100.00 | ORAL | Status: DC
Start: ? — End: 2018-10-09

## 2018-10-10 ENCOUNTER — Telehealth: Payer: Self-pay | Admitting: Registered Nurse
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
## 2018-10-14 LAB — SUSCEPTIBILITY, AER + ANAEROB

## 2018-10-14 LAB — SUSCEPTIBILITY RESULT

## 2018-10-15 NOTE — Telephone Encounter (Signed)
Unclear on what this means for the patient - I will reach out to Dr. Caren Hazy, but I will likely need a visit with her to get further answers. [name] scheduled for a visit with me tomorrow afternoon via telehealth and a follow up on Friday afternoon. Thank you, Kathrin Ruddy, NP

## 2018-10-16 ENCOUNTER — Ambulatory Visit (HOSPITAL_COMMUNITY): Payer: Medicare HMO | Admitting: Psychiatry

## 2018-10-16 ENCOUNTER — Other Ambulatory Visit: Payer: Self-pay

## 2018-10-16 ENCOUNTER — Telehealth: Payer: Medicare HMO | Admitting: Registered Nurse

## 2018-10-16 MED ORDER — Medication
Status: DC
Start: ? — End: 2018-10-16

## 2018-10-16 MED ORDER — GUANABENZ ACETATE 4 MG PO TABS
5.00 | ORAL_TABLET | ORAL | Status: DC
Start: 2018-10-17 — End: 2018-10-16

## 2018-10-16 MED ORDER — COAGULATION FACTOR IX (RECOMB)
10.00 | Status: DC
Start: 2018-10-16 — End: 2018-10-16

## 2018-10-16 MED ORDER — SPINAL NEEDLE (REUSABLE) 22G X 3-1/2" MISC
40.00 | Status: DC
Start: 2018-10-16 — End: 2018-10-16

## 2018-10-16 MED ORDER — ACCU-PRO PUMP SET/VENT MISC
1.25 | Status: DC
Start: ? — End: 2018-10-16

## 2018-10-17 ENCOUNTER — Ambulatory Visit (HOSPITAL_COMMUNITY): Payer: Medicare HMO | Admitting: Psychiatry

## 2018-10-17 ENCOUNTER — Other Ambulatory Visit: Payer: Self-pay

## 2018-10-17 MED ORDER — Medication
Status: DC
Start: ? — End: 2018-10-17

## 2018-10-18 ENCOUNTER — Ambulatory Visit: Payer: Medicare HMO | Admitting: Registered Nurse

## 2018-10-19 ENCOUNTER — Emergency Department (HOSPITAL_COMMUNITY): Payer: Medicare HMO

## 2018-10-19 ENCOUNTER — Emergency Department (HOSPITAL_COMMUNITY)
Admission: EM | Admit: 2018-10-19 | Discharge: 2018-10-19 | Disposition: A | Discharge status: Home / Self Care | Payer: Medicare HMO | Attending: Emergency Medicine | Admitting: Emergency Medicine

## 2018-10-19 ENCOUNTER — Other Ambulatory Visit: Payer: Self-pay

## 2018-10-19 ENCOUNTER — Encounter (HOSPITAL_COMMUNITY): Payer: Self-pay | Admitting: Emergency Medicine

## 2018-10-19 DIAGNOSIS — E1122 Type 2 diabetes mellitus with diabetic chronic kidney disease: Secondary | ICD-10-CM | POA: Insufficient documentation

## 2018-10-19 DIAGNOSIS — R42 Dizziness and giddiness: Secondary | ICD-10-CM | POA: Diagnosis not present

## 2018-10-19 DIAGNOSIS — Z87891 Personal history of nicotine dependence: Secondary | ICD-10-CM | POA: Diagnosis not present

## 2018-10-19 DIAGNOSIS — R29818 Other symptoms and signs involving the nervous system: Secondary | ICD-10-CM | POA: Diagnosis not present

## 2018-10-19 DIAGNOSIS — G9341 Metabolic encephalopathy: Secondary | ICD-10-CM | POA: Diagnosis not present

## 2018-10-19 DIAGNOSIS — N183 Chronic kidney disease, stage 3 unspecified: Secondary | ICD-10-CM | POA: Insufficient documentation

## 2018-10-19 DIAGNOSIS — R4020 Unspecified coma: Secondary | ICD-10-CM | POA: Diagnosis not present

## 2018-10-19 DIAGNOSIS — S0990XA Unspecified injury of head, initial encounter: Secondary | ICD-10-CM | POA: Diagnosis not present

## 2018-10-19 DIAGNOSIS — S199XXA Unspecified injury of neck, initial encounter: Secondary | ICD-10-CM | POA: Diagnosis not present

## 2018-10-19 DIAGNOSIS — W1830XA Fall on same level, unspecified, initial encounter: Secondary | ICD-10-CM | POA: Insufficient documentation

## 2018-10-19 DIAGNOSIS — Z7982 Long term (current) use of aspirin: Secondary | ICD-10-CM | POA: Insufficient documentation

## 2018-10-19 DIAGNOSIS — R29810 Facial weakness: Secondary | ICD-10-CM | POA: Diagnosis not present

## 2018-10-19 DIAGNOSIS — I10 Essential (primary) hypertension: Secondary | ICD-10-CM | POA: Diagnosis not present

## 2018-10-19 DIAGNOSIS — I5032 Chronic diastolic (congestive) heart failure: Secondary | ICD-10-CM | POA: Diagnosis not present

## 2018-10-19 DIAGNOSIS — I130 Hypertensive heart and chronic kidney disease with heart failure and stage 1 through stage 4 chronic kidney disease, or unspecified chronic kidney disease: Secondary | ICD-10-CM | POA: Diagnosis not present

## 2018-10-19 DIAGNOSIS — R4781 Slurred speech: Secondary | ICD-10-CM | POA: Diagnosis not present

## 2018-10-19 DIAGNOSIS — Z79899 Other long term (current) drug therapy: Secondary | ICD-10-CM | POA: Diagnosis not present

## 2018-10-19 DIAGNOSIS — R404 Transient alteration of awareness: Secondary | ICD-10-CM | POA: Diagnosis present

## 2018-10-19 DIAGNOSIS — R4182 Altered mental status, unspecified: Secondary | ICD-10-CM | POA: Diagnosis not present

## 2018-10-19 DIAGNOSIS — R569 Unspecified convulsions: Secondary | ICD-10-CM | POA: Diagnosis not present

## 2018-10-19 DIAGNOSIS — G40909 Epilepsy, unspecified, not intractable, without status epilepticus: Secondary | ICD-10-CM | POA: Diagnosis not present

## 2018-10-19 LAB — COMPREHENSIVE METABOLIC PANEL
ALT: 18 U/L (ref 0–44)
AST: 26 U/L (ref 15–41)
Albumin: 4.1 g/dL (ref 3.5–5.0)
Alkaline Phosphatase: 105 U/L (ref 38–126)
Anion gap: 11 (ref 5–15)
BUN: 42 mg/dL — ABNORMAL HIGH (ref 6–20)
CO2: 30 mmol/L (ref 22–32)
Calcium: 9.8 mg/dL (ref 8.9–10.3)
Chloride: 100 mmol/L (ref 98–111)
Creatinine, Ser: 1.86 mg/dL — ABNORMAL HIGH (ref 0.44–1.00)
GFR calc Af Amer: 34 mL/min — ABNORMAL LOW (ref >60)
GFR calc non Af Amer: 29 mL/min — ABNORMAL LOW (ref >60)
Glucose, Bld: 81 mg/dL (ref 70–99)
Potassium: 3.5 mmol/L (ref 3.5–5.1)
Sodium: 141 mmol/L (ref 135–145)
Total Bilirubin: 0.5 mg/dL (ref 0.3–1.2)
Total Protein: 6.9 g/dL (ref 6.5–8.1)

## 2018-10-19 LAB — RAPID URINE DRUG SCREEN, HOSP PERFORMED
Amphetamines: NOT DETECTED
Barbiturates: NOT DETECTED
Benzodiazepines: POSITIVE — AB
Cocaine: NOT DETECTED
Opiates: NOT DETECTED
Tetrahydrocannabinol: NOT DETECTED

## 2018-10-19 LAB — I-STAT CHEM 8, ED
BUN: 42 mg/dL — ABNORMAL HIGH (ref 6–20)
Calcium, Ion: 1.19 mmol/L (ref 1.15–1.40)
Chloride: 99 mmol/L (ref 98–111)
Creatinine, Ser: 1.8 mg/dL — ABNORMAL HIGH (ref 0.44–1.00)
Glucose, Bld: 73 mg/dL (ref 70–99)
HCT: 31 % — ABNORMAL LOW (ref 36.0–46.0)
Hemoglobin: 10.5 g/dL — ABNORMAL LOW (ref 12.0–15.0)
Potassium: 3.3 mmol/L — ABNORMAL LOW (ref 3.5–5.1)
Sodium: 141 mmol/L (ref 135–145)
TCO2: 31 mmol/L (ref 22–32)

## 2018-10-19 LAB — CBC
HCT: 30.5 % — ABNORMAL LOW (ref 36.0–46.0)
Hemoglobin: 9.3 g/dL — ABNORMAL LOW (ref 12.0–15.0)
MCH: 28.4 pg (ref 26.0–34.0)
MCHC: 30.5 g/dL (ref 30.0–36.0)
MCV: 93.3 fL (ref 80.0–100.0)
Platelets: 318 10*3/uL (ref 150–400)
RBC: 3.27 MIL/uL — ABNORMAL LOW (ref 3.87–5.11)
RDW: 14.4 % (ref 11.5–15.5)
WBC: 7 10*3/uL (ref 4.0–10.5)
nRBC: 0 % (ref 0.0–0.2)

## 2018-10-19 LAB — DIFFERENTIAL
Abs Immature Granulocytes: 0.02 10*3/uL (ref 0.00–0.07)
Basophils Absolute: 0.1 10*3/uL (ref 0.0–0.1)
Basophils Relative: 1 %
Eosinophils Absolute: 0.3 10*3/uL (ref 0.0–0.5)
Eosinophils Relative: 4 %
Immature Granulocytes: 0 %
Lymphocytes Relative: 43 %
Lymphs Abs: 3.1 10*3/uL (ref 0.7–4.0)
Monocytes Absolute: 0.7 10*3/uL (ref 0.1–1.0)
Monocytes Relative: 9 %
Neutro Abs: 3 10*3/uL (ref 1.7–7.7)
Neutrophils Relative %: 43 %

## 2018-10-19 LAB — I-STAT BETA HCG BLOOD, ED (MC, WL, AP ONLY): I-stat hCG, quantitative: <5 m[IU]/mL (ref <5)

## 2018-10-19 LAB — APTT: aPTT: 31 seconds (ref 24–36)

## 2018-10-19 LAB — PROTIME-INR
INR: 1.1 (ref 0.8–1.2)
Prothrombin Time: 13.9 seconds (ref 11.4–15.2)

## 2018-10-19 MED ORDER — SODIUM CHLORIDE 0.9% FLUSH: 3.0000 mL | Freq: Once | INTRAVENOUS | Status: DC

## 2018-10-19 MED ORDER — GABAPENTIN 300 MG PO CAPS: 600.0000 mg | ORAL_CAPSULE | Freq: Two times a day (BID) | ORAL | Status: DC

## 2018-10-19 NOTE — ED Notes (Signed)
Patient transported to CT 

## 2018-10-19 NOTE — Progress Notes (Addendum)
Greatly improved, now back to baseline. She did not take her gabapentin last night. States that she "had not yet had the chance" due to being distracted.    She felt "dizzy" and then lost consciousness. Family heard her fall and found her down, now movements by that point. She gradually improved and is now essentially back to normal.   Givne that she has not had a seizure in 6 years, and this breakthrough was in the setting of a delayed dose, I would not favor changing AEDs at this time. She takes gabapentin 600mg  BID(Cr 1.8)  She can follow up with outpatient neurology.   Roland Rack, MD [facility] [phone]  If 7pm- 7am, please page neurology on call as listed in [place].

## 2018-10-19 NOTE — ED Notes (Signed)
Pt back from ct

## 2018-10-19 NOTE — ED Notes (Signed)
Pt verbalized understanding of d/c instructions and has no further questions. Pt to follow up with family dr and neurologist.

## 2018-10-19 NOTE — ED Provider Notes (Signed)
[facility] EMERGENCY DEPARTMENT Provider Note   CSN: [id number] Arrival date & time: 10/19/18  4174  An emergency department physician performed an initial assessment on this suspected stroke patient at 0251.  History   Chief Complaint Chief Complaint  Patient presents with  . Code Stroke    HPI Tiffany [name] is a 58 y.o. female.     Patient brought to the ER by EMS from home. Patient is a Code Stroke. Family reports that she fell at 02:25. She was talking on the phone at the time of the fall. EMS report that patient was exhibiting decreased responsiveness initially, then became agitated and had slurred speech. She has had left sided deficit during transport. PAtient uncooperative and non-verbal at arrival. Level V Caveat due to acuity/mental status change.     Past Medical History:  Diagnosis Date  . Anxiety   . Arthritis    "all over" (04/10/2016)  . Asthma   . Cardiac arrest ([place]) 09/08/2016   PEA  . Carotid artery stenosis    1-39% bilateral by dopplers 11/2016  . Chronic bronchitis ([place])   . Chronic diastolic (congestive) heart failure ([place])   . Chronic kidney disease    "I see a kidney dr." (04/10/2016)  . Cocaine abuse ([place])   . Complication of anesthesia    decreased bp, decreased heart rate  . Depression   . Disorder of nervous system   . Emphysema   . GERD (gastroesophageal reflux disease)   . Heart attack ([place]) 1980s  . History of blood transfusion 1994   "couldn't stop bleeding from my period"  . Hyperlipidemia LDL goal <70   . Hypertension   . Incontinence   . Manic depression ([place])   . On home oxygen therapy    "6L; 24/7" (04/10/2016)  . OSA on CPAP    "wear mask sometimes" (04/10/2016)  . Paranoid ([place])    "sometimes; I'm on RX for it" (04/10/2016)  . Pneumonia    "I've had it several times; haven't had it since 06/2015" (04/10/2016)  . Schizophrenia ([place])   . Seasonal allergies   . Seizures ([place])    "don't know what kind;  last one was ~ 1 yr ago" (04/10/2016)  . Sinus trouble   . Stroke [facility]) 1980s   denies residual on 04/10/2016  . Type II diabetes mellitus [facility])     Patient Active Problem List   Diagnosis Date Noted  . Chronic post-traumatic stress disorder (PTSD) 05/27/2018  . Schizoaffective disorder, bipolar type ([place]) 04/05/2018  . Closed displaced fracture of fifth metacarpal bone 03/21/2018  . Difficulty with speech 01/24/2018  . Vaginal discharge 01/03/2018  . AKI (acute kidney injury) ([place]) 11/21/2017  . Encephalopathy 11/21/2017  . Drug abuse ([place]) 11/21/2017  . Frequent falls 10/11/2017  . Binge eating disorder   . Dependence on continuous supplemental oxygen 05/14/2017  . Gout 04/11/2017  . Palliative care encounter 03/12/2017  . CKD (chronic kidney disease) stage 3, GFR 30-59 ml/min 12/15/2016  . Carotid artery stenosis   . Osteoarthritis 10/26/2016  . Anoxic brain injury ([place]) 09/08/2016  . Overactive bladder 06/07/2016  . QT prolongation   . OSA and COPD overlap syndrome ([place])   . Arthritis   . Essential hypertension 03/22/2016  . Cocaine use disorder, severe, in sustained remission ([place]) 12/17/2015  . History of drug abuse in remission ([place]) 11/28/2015  . Chronic diastolic congestive heart failure ([place])   . Chronic respiratory failure with hypoxia and hypercapnia (  [place]) 06/22/2015  . Tobacco use disorder 07/22/2014  . COPD (chronic obstructive pulmonary disease) ([place]) 07/08/2014  . Seizure ([place]) 01/04/2013  . Chronic pain syndrome 06/18/2012  . Dyslipidemia 04/24/2011  . Anemia 04/24/2011  . Diabetic neuropathy ([place]) 04/24/2011  . Obstructive sleep apnea 10/18/2010  . Asthma 10/18/2010  . Morbid obesity ([place]) 10/18/2010  . Type 2 diabetes mellitus ([place]) 10/18/2010    Past Surgical History:  Procedure Laterality Date  . CESAREAN SECTION  1997  . HERNIA REPAIR    . IR CHOLANGIOGRAM EXISTING TUBE  07/20/2016  . IR PERC CHOLECYSTOSTOMY  05/10/2016  . IR RADIOLOGIST EVAL &  MGMT  06/08/2016  . IR RADIOLOGIST EVAL & MGMT  06/29/2016  . IR SINUS/FIST TUBE CHK-NON GI  07/12/2016  . RIGHT/LEFT HEART CATH AND CORONARY ANGIOGRAPHY N/A 06/19/2017   Procedure: RIGHT/LEFT HEART CATH AND CORONARY ANGIOGRAPHY;  Surgeon: Jolaine Artist, MD;  Location: [facility];  Service: Cardiovascular;  Laterality: N/A;  . TIBIA IM NAIL INSERTION Right 07/12/2016   Procedure: INTRAMEDULLARY (IM) NAIL RIGHT TIBIA;  Surgeon: Leandrew Koyanagi, MD;  Location: [facility];  Service: Orthopedics;  Laterality: Right;  . UMBILICAL HERNIA REPAIR  ~ 1963   "that's why I don't have a belly button"  . VAGINAL HYSTERECTOMY       OB History    Gravida  4   Para  4   Term  3   Preterm  1   AB  0   Living  3     SAB  0   TAB  0   Ectopic  0   Multiple  0   Live Births  3            Home Medications    Prior to Admission medications   Medication Sig Start Date End Date Taking? Authorizing Provider  albuterol (PROVENTIL) (2.5 MG/3ML) 0.083% nebulizer solution Take 3 mLs (2.5 mg total) by nebulization every 6 (six) hours as needed for wheezing or shortness of breath. 03/25/18   Martyn Ehrich, NP  albuterol (VENTOLIN HFA) 108 (90 Base) MCG/ACT inhaler Inhale 2 puffs into the lungs every 6 (six) hours as needed for wheezing or shortness of breath. 08/13/18   Lauraine Rinne, NP  amLODipine (NORVASC) 5 MG tablet Take 5 mg by mouth daily.    [provider]  aspirin (GOODSENSE ASPIRIN LOW DOSE) 81 MG EC tablet TAKE 1 TABLET (81 MG TOTAL) BY MOUTH DAILY (MORNING). Patient taking differently: Take 81 mg by mouth daily.  11/12/17   Zenia Resides, MD  atorvastatin (LIPITOR) 80 MG tablet Take 1 tablet (80 mg total) by mouth daily. 08/05/18   Maximiano Coss, NP  colchicine (COLCRYS) 0.6 MG tablet TAKE 1 TABLET (0.6 MG TOTAL) BY MOUTH 2 (TWO) TIMES DAILY. Patient taking differently: Take 0.6 mg by mouth 2 (two) times daily.  10/17/17   Zenia Resides, MD  cyclobenzaprine  (FLEXERIL) 5 MG tablet Take 1 tablet (5 mg total) by mouth 3 (three) times daily as needed for muscle spasms. 08/30/18   Maximiano Coss, NP  diazepam (VALIUM) 10 MG tablet Take 1 tablet (10 mg total) by mouth 3 (three) times daily. 10/01/18 12/30/18  Pucilowski, Marchia Bond, MD  diclofenac (VOLTAREN) 75 MG EC tablet Take 75 mg by mouth daily as needed for mild pain or moderate pain.  07/10/18   [provider]  diclofenac sodium (VOLTAREN) 1 % GEL Apply 2 g topically 4 (four) times  daily. Patient taking differently: Apply 2 g topically 4 (four) times daily as needed (pain).  01/21/18   Wieters, Hallie C, PA-C  docusate sodium (COLACE) 100 MG capsule Take 100 mg by mouth daily as needed for mild constipation or moderate constipation.  11/12/08   [provider]  escitalopram (LEXAPRO) 20 MG tablet Take 2 tablets (40 mg total) by mouth daily. 10/01/18 12/30/18  Pucilowski, Marchia Bond, MD  febuxostat (ULORIC) 40 MG tablet Take 40 mg by mouth daily.  03/27/18   [provider]  ferrous sulfate 325 (65 FE) MG tablet Take 1 tablet (325 mg total) by mouth daily with breakfast. 09/17/18   Maximiano Coss, NP  flunisolide (NASALIDE) 25 MCG/ACT (0.025%) SOLN Place 2 sprays into the nose daily. Patient not taking: Reported on 10/06/2018 06/26/17   McDiarmid, Blane Ohara, MD  Fluticasone-Umeclidin-Vilant (TRELEGY ELLIPTA) 100-62.5-25 MCG/INH AEPB Inhale 1 puff into the lungs daily. 08/13/18   Lauraine Rinne, NP  gabapentin (NEURONTIN) 600 MG tablet TAKE 1 TABLET BY MOUTH TWICE A DAY Patient taking differently: Take 600 mg by mouth 2 (two) times daily.  09/27/18   Maximiano Coss, NP  glucose blood (PRECISION QID TEST) test strip by Does not apply route. 02/07/07   [provider]  lisdexamfetamine (VYVANSE) 70 MG capsule Take 1 capsule (70 mg total) by mouth daily. 10/01/18 10/31/18  Pucilowski, Marchia Bond, MD  metolazone (ZAROXOLYN) 2.5 MG tablet Take 1 tablet (2.5 mg total) by mouth daily. Patient  taking differently: Take 2.5 mg by mouth 3 (three) times a week.  04/22/18   Robyn Haber, MD  miconazole (MICONAZOLE 7) 2 % vaginal cream Place 1 Applicatorful vaginally at bedtime. Patient not taking: Reported on 10/06/2018 03/18/18   Larene Pickett, PA-C  montelukast (SINGULAIR) 10 MG tablet TAKE 1 TABLET BY MOUTH EVERYDAY AT BEDTIME Patient taking differently: Take 10 mg by mouth at bedtime.  09/16/18   Maximiano Coss, NP  Multiple Vitamin (MULTIVITAMIN WITH MINERALS) TABS tablet Take 1 tablet by mouth daily.    [provider]  omeprazole (PRILOSEC) 40 MG capsule Take 1 capsule (40 mg total) by mouth 2 (two) times daily. 10/17/17   Zenia Resides, MD  oxybutynin (DITROPAN-XL) 10 MG 24 hr tablet Take 1-2 tablets (10-20 mg total) by mouth at bedtime. TAKE 1 TABLET BY MOUTH EVERYDAY AT BEDTIME Patient taking differently: Take 10 mg by mouth at bedtime.  09/14/18   Wendie Agreste, MD  polyethylene glycol (MIRALAX / GLYCOLAX) 17 g packet Take 17 g by mouth daily as needed for mild constipation or moderate constipation.  11/12/08   [provider]  potassium chloride (K-DUR) 10 MEQ tablet Take 10 mEq by mouth 2 (two) times daily.    [provider]  prazosin (MINIPRESS) 1 MG capsule Take 1 capsule (1 mg total) by mouth at bedtime. 10/01/18 12/30/18  Pucilowski, Marchia Bond, MD  SAPHRIS 2.5 MG SUBL Place 1 tablet (2.5 mg total) under the tongue 3 (three) times daily. 10/01/18 12/30/18  Pucilowski, Olgierd A, MD  tacrolimus (PROTOPIC) 0.03 % ointment Apply topically 2 (two) times daily. 08/30/18   Maximiano Coss, NP  terconazole (TERAZOL 3) 0.8 % vaginal cream Apply pea sized amount to clitoris nightly. Patient not taking: Reported on 10/06/2018 04/18/18   Sloan Leiter, MD  torsemide (DEMADEX) 20 MG tablet Take 4 tablets (80 mg total) by mouth 2 (two) times daily. 06/06/17   [name], MD  traZODone (DESYREL) 100 MG tablet Take  2 tablets (200 mg total) by mouth at  bedtime. 10/01/18 12/30/18  Pucilowski, Marchia Bond, MD  zinc oxide 20 % ointment Apply 1 application topically as needed for irritation. Patient not taking: Reported on 10/06/2018 02/07/18   Zenia Resides, MD    Family History Family History  Problem Relation Age of Onset  . Cancer Father        prostate  . Cancer Mother        lung  . Depression Mother   . Depression Sister   . Anxiety disorder Sister   . Schizophrenia Sister   . Bipolar disorder Sister   . Depression Sister   . Depression Brother   . Heart failure Other        cousin    Social History Social History   Tobacco Use  . Smoking status: Former Smoker    Packs/day: 1.50    Years: 38.00    Pack years: 57.00    Types: Cigarettes    Start date: 03/13/1977    Quit date: 04/10/2016    Years since quitting: 2.5  . Smokeless tobacco: Never Used  Substance Use Topics  . Alcohol use: No    Alcohol/week: 0.0 standard drinks  . Drug use: No    Types: Cocaine    Comment: 04/10/2016 "last used cocaine back in November 2017"     Allergies   Hydrocodone-acetaminophen, Hydroxyzine, Latuda [lurasidone hcl], Magnesium-containing compounds, Prednisone, Tramadol, Codeine, Sulfa antibiotics, and Tape   Review of Systems Review of Systems  Unable to perform ROS: Acuity of condition     Physical Exam Updated Vital Signs BP 119/76   Pulse 64   Temp 97.8 F (36.6 C) (Oral)   Resp 15   Wt 69.4 kg   SpO2 97%   BMI 21.95 kg/m   Physical Exam Vitals signs and nursing note reviewed.  HENT:     Head: Atraumatic.  Eyes:     Pupils: Pupils are equal, round, and reactive to light.  Neck:     Musculoskeletal: Neck supple.  Cardiovascular:     Rate and Rhythm: Normal rate and regular rhythm.  Pulmonary:     Breath sounds: Normal breath sounds.  Abdominal:     Palpations: Abdomen is soft.  Skin:    General: Skin is warm and dry.  Neurological:     Comments: Patient initially difficult to evaluate but became  more responsive with stimulation - no obvious focal deficits      ED Treatments / Results  Labs (all labs ordered are listed, but only abnormal results are displayed) Labs Reviewed  CBC - Abnormal; Notable for the following components:      Result Value   RBC 3.27 (*)    Hemoglobin 9.3 (*)    HCT 30.5 (*)    All other components within normal limits  COMPREHENSIVE METABOLIC PANEL - Abnormal; Notable for the following components:   BUN 42 (*)    Creatinine, Ser 1.86 (*)    GFR calc non Af Amer 29 (*)    GFR calc Af Amer 34 (*)    All other components within normal limits  I-STAT CHEM 8, ED - Abnormal; Notable for the following components:   Potassium 3.3 (*)    BUN 42 (*)    Creatinine, Ser 1.80 (*)    Hemoglobin 10.5 (*)    HCT 31.0 (*)    All other components within normal limits  PROTIME-INR  APTT  DIFFERENTIAL  RAPID URINE DRUG  SCREEN, HOSP PERFORMED  I-STAT BETA HCG BLOOD, ED ([facility], [facility], [facility] ONLY)  CBG MONITORING, ED    EKG EKG Interpretation  Date/Time:  Saturday October 19 2018 04:05:20 EDT Ventricular Rate:  68 PR Interval:    QRS Duration: 82 QT Interval:  460 QTC Calculation: 490 R Axis:   51 Text Interpretation:  Sinus rhythm Borderline prolonged QT interval Confirmed by Orpah Greek ([id number]) on 10/19/2018 4:38:01 AM   Radiology Mr Brain Wo Contrast  Result Date: 10/19/2018 CLINICAL DATA:  Fall. Left-sided facial droop and slurred speech. EXAM: MRI HEAD WITHOUT CONTRAST TECHNIQUE: Multiplanar, multiecho pulse sequences of the brain and surrounding structures were obtained without intravenous contrast. COMPARISON:  Head CT 10/19/2018 Brain MRI 01/06/2013 FINDINGS: BRAIN: There is no acute infarct, acute hemorrhage or extra-axial collection. Early confluent hyperintense T2-weighted signal of the periventricular and deep white matter, most commonly due to chronic ischemic microangiopathy. White matter disease has progressed from the prior study. No age  advanced or lobar predominant volume loss. A partially empty sella is incidentally noted. VASCULAR: The major intracranial arterial and venous sinus flow voids are normal. Susceptibility-sensitive sequences show no chronic microhemorrhage or superficial siderosis. SKULL AND UPPER CERVICAL SPINE: Calvarial bone marrow signal is normal. There is no skull base mass. The visualized upper cervical spine and soft tissues are normal. SINUSES/ORBITS: There are no fluid levels or advanced mucosal thickening. The mastoid air cells and middle ear cavities are free of fluid. The orbits are normal. IMPRESSION: 1. No acute intracranial process. 2. Progression of white matter changes most commonly indicating chronic small vessel disease. Electronically Signed   By: Ulyses Jarred M.D.   On: 10/19/2018 04:17   Ct Head Code Stroke Wo Contrast  Result Date: 10/19/2018 CLINICAL DATA:  Code stroke.  Left-sided weakness and facial droop EXAM: CT HEAD WITHOUT CONTRAST TECHNIQUE: Contiguous axial images were obtained from the base of the skull through the vertex without intravenous contrast. COMPARISON:  Head CT 11/21/2017 FINDINGS: Brain: There is no mass, hemorrhage or extra-axial collection. The size and configuration of the ventricles and extra-axial CSF spaces are normal. There is hypoattenuation of the periventricular white matter, most commonly indicating chronic ischemic microangiopathy. Vascular: No abnormal hyperdensity of the major intracranial arteries or dural venous sinuses. No intracranial atherosclerosis. Skull: The visualized skull base, calvarium and extracranial soft tissues are normal. Sinuses/Orbits: No fluid levels or advanced mucosal thickening of the visualized paranasal sinuses. No mastoid or middle ear effusion. The orbits are normal. ASPECTS [facility] Stroke Program Early CT Score) - Ganglionic level infarction (caudate, lentiform nuclei, internal capsule, insula, M1-M3 cortex): 7 - Supraganglionic infarction  (M4-M6 cortex): 3 Total score (0-10 with 10 being normal): 10 IMPRESSION: 1. No acute hemorrhage. 2. ASPECTS is 10. These results were communicated to Dr. Karena Addison Aroor at 3:15 am on 10/19/2018 by text page via the [facility] messaging system. Electronically Signed   By: Ulyses Jarred M.D.   On: 10/19/2018 03:15    Procedures Procedures (including critical care time)  Medications Ordered in ED Medications  sodium chloride flush (NS) 0.9 % injection 3 mL (3 mLs Intravenous Not Given 10/19/18 0420)     Initial Impression / Assessment and Plan / ED Course  I have reviewed the triage vital signs and the nursing notes.  Pertinent labs & imaging results that were available during my care of the patient were reviewed by me and considered in my medical decision making (see chart for details).  Patient brought to the emergency department as a code stroke.  Family reported hearing the patient fall just prior to coming to the ER.  EMS report that the identified left facial droop, slurred speech, left-sided weakness.  At arrival to the ER, however, patient's exam was inconsistent.  She was intermittently somnolent and then agitated, yelling and not answering questions appropriately.  She underwent CT scan that did not show any acute abnormality.  Dr. Lorraine Lax, neurology, brought the patient for emergent MRI which did not show any evidence of stroke.  Patient has improved over time here in the ER.  [name] still somewhat sleepy but mostly back to her normal neurologic baseline.  No focal neurologic deficits, follows commands with all 4 extremities.  She does have a distant history of seizure.  Patient does also have a significant psychiatric history and is on multiple sedating medications, unclear if this was medication error or if she had a seizure or other event.  It has been recommended that she be admitted for observation, EEG, further work-up.  Final Clinical Impressions(s) / ED Diagnoses   Final  diagnoses:  Altered mental status, unspecified altered mental status type    ED Discharge Orders    None       Orpah Greek, MD 10/19/18 [phone]

## 2018-10-19 NOTE — Discharge Instructions (Addendum)
Please remember to take your neurology so you do not have any further seizures.  Return to the ER if you have any further seizures.  Otherwise follow-up with your primary doctor and neurologist.

## 2018-10-19 NOTE — Consult Note (Signed)
Requesting Physician: Dr. Betsey Holiday    Chief Complaint: Confusion,let side weakness, slurred speech after fall  History obtained from: Patient and Chart     HPI:                                                                                                                                       Tiffany [name] is a 58 y.o. female with past medical history of coronary artery disease with MI, PEA cardiac arrest, chronic diastolic congestive heart failure, chronic kidney disease, depression with suicidal ideations, hypertension, hyperlipidemia, type 2 diabetes mellitus, polysubstance abuse who presents to the emergency department after family heard her fall at 2:20 AM and noted patient was altered.  EMS was called and patient apparently was lethargic, had slurred speech, appeared weaker on the left side,compared to the right with left facial droop and therefore brought in as a code stroke.  Blood pressure was 143 x 90 mmHg glucose was 107.  Patient having waxing and waning of mental status.  A stat CT head was obtained which showed no acute findings.  NIH stroke scale was 12 predominantly due to weakness in all 4 extremities and patient not fully cooperative on exam although she did accurately state her age and month of the year.  She also complained of pain on moving both of her legs.  A stat MRI was obtained due to lower suspicion for stroke and MRI brain was negative for acute infarct.  tPA was not administered as patient MRI was negative for stroke.  Date last known well: 10.3.20 Time last known well: 2.20 am  tPA Given: No, MRI negative for stroke. NIHSS: 12 Baseline MRS 0   Past Medical History:  Diagnosis Date  . Anxiety   . Arthritis    "all over" (04/10/2016)  . Asthma   . Cardiac arrest ([place]) 09/08/2016   PEA  . Carotid artery stenosis    1-39% bilateral by dopplers 11/2016  . Chronic bronchitis ([place])   . Chronic diastolic (congestive) heart failure ([place])   . Chronic kidney  disease    "I see a kidney dr." (04/10/2016)  . Cocaine abuse ([place])   . Complication of anesthesia    decreased bp, decreased heart rate  . Depression   . Disorder of nervous system   . Emphysema   . GERD (gastroesophageal reflux disease)   . Heart attack ([place]) 1980s  . History of blood transfusion 1994   "couldn't stop bleeding from my period"  . Hyperlipidemia LDL goal <70   . Hypertension   . Incontinence   . Manic depression ([place])   . On home oxygen therapy    "6L; 24/7" (04/10/2016)  . OSA on CPAP    "wear mask sometimes" (04/10/2016)  . Paranoid ([place])    "sometimes; I'm on RX for it" (04/10/2016)  . Pneumonia    "I've had it several times; haven't had  it since 06/2015" (04/10/2016)  . Schizophrenia ([place])   . Seasonal allergies   . Seizures ([place])    "don't know what kind; last one was ~ 1 yr ago" (04/10/2016)  . Sinus trouble   . Stroke [facility]) 1980s   denies residual on 04/10/2016  . Type II diabetes mellitus ([place])     Past Surgical History:  Procedure Laterality Date  . CESAREAN SECTION  1997  . HERNIA REPAIR    . IR CHOLANGIOGRAM EXISTING TUBE  07/20/2016  . IR PERC CHOLECYSTOSTOMY  05/10/2016  . IR RADIOLOGIST EVAL & MGMT  06/08/2016  . IR RADIOLOGIST EVAL & MGMT  06/29/2016  . IR SINUS/FIST TUBE CHK-NON GI  07/12/2016  . RIGHT/LEFT HEART CATH AND CORONARY ANGIOGRAPHY N/A 06/19/2017   Procedure: RIGHT/LEFT HEART CATH AND CORONARY ANGIOGRAPHY;  Surgeon: Jolaine Artist, MD;  Location: [facility];  Service: Cardiovascular;  Laterality: N/A;  . TIBIA IM NAIL INSERTION Right 07/12/2016   Procedure: INTRAMEDULLARY (IM) NAIL RIGHT TIBIA;  Surgeon: Leandrew Koyanagi, MD;  Location: [facility];  Service: Orthopedics;  Laterality: Right;  . UMBILICAL HERNIA REPAIR  ~ 1963   "that's why I don't have a belly button"  . VAGINAL HYSTERECTOMY      Family History  Problem Relation Age of Onset  . Cancer Father        prostate  . Cancer Mother        lung  . Depression Mother   .  Depression Sister   . Anxiety disorder Sister   . Schizophrenia Sister   . Bipolar disorder Sister   . Depression Sister   . Depression Brother   . Heart failure Other        cousin   Social History:  reports that she quit smoking about 2 years ago. Her smoking use included cigarettes. She started smoking about 41 years ago. She has a 57.00 pack-year smoking history. She has never used smokeless tobacco. She reports that she does not drink alcohol or use drugs.  Allergies:  Allergies  Allergen Reactions  . Hydrocodone-Acetaminophen Shortness Of Breath  . Hydroxyzine Anaphylaxis and Shortness Of Breath  . Latuda [Lurasidone Hcl] Anaphylaxis  . Magnesium-Containing Compounds Anaphylaxis  . Prednisone Anaphylaxis, Swelling and Other (See Comments)    Tongue swelling  . Tramadol Anaphylaxis  . Codeine Nausea And Vomiting  . Sulfa Antibiotics Itching  . Tape Rash    Medications:                                                                                                                        I reviewed home medications   ROS:  14 systems reviewed and negative except above    Examination:                                                                                                      General: Appears well-developed Psych: Unable to assess due to lethargy Eyes: No scleral injection HENT: No OP obstrucion Head: Normocephalic.  Cardiovascular: Normal rate and regular rhythm. Respiratory: Effort normal and breath sounds normal to anterior ascultation GI: Soft.  No distension. There is no tenderness.  Skin: WDI    Neurological Examination Mental Status: Lethargic, but arousable.  Patient answers her name, stated age and month correctly.  No evidence of aphasia.  Slurred speech.  Follows commands intermittently.  Not fully cooperative on  exam. Cranial Nerves: II: Visual fields grossly normal,  III,IV, VI: ptosis not present, extra-ocular motions intact bilaterally, pupils equal, round, reactive to light and accommodation V,VII: No obvious facial droop noted, unable to test facial sensation XII: midline tongue extension Motor: Right : Upper extremity   4/5    Left:     Upper extremity   3/5  Lower extremity   2/5     Lower extremity   3/5 Tone and bulk:normal tone throughout; no atrophy noted Sensory: Pinprick and light touch intact throughout, bilaterally Deep Tendon Reflexes: 2+ and symmetric throughout Plantars: Right: downgoing   Left: downgoing Cerebellar: normal finger-to-nose, normal rapid alternating movements and normal heel-to-shin test Gait: normal gait and station     Lab Results: Basic Metabolic Panel: Recent Labs  Lab 10/19/18 0300  NA 141  K 3.3*  CL 99  GLUCOSE 73  BUN 42*  CREATININE 1.80*    CBC: Recent Labs  Lab 10/19/18 0257 10/19/18 0300  WBC 7.0  --   NEUTROABS 3.0  --   HGB 9.3* 10.5*  HCT 30.5* 31.0*  MCV 93.3  --   PLT 318  --     Coagulation Studies: No results for input(s): LABPROT, INR in the last 72 hours.  Imaging: Ct Head Code Stroke Wo Contrast  Result Date: 10/19/2018 CLINICAL DATA:  Code stroke.  Left-sided weakness and facial droop EXAM: CT HEAD WITHOUT CONTRAST TECHNIQUE: Contiguous axial images were obtained from the base of the skull through the vertex without intravenous contrast. COMPARISON:  Head CT 11/21/2017 FINDINGS: Brain: There is no mass, hemorrhage or extra-axial collection. The size and configuration of the ventricles and extra-axial CSF spaces are normal. There is hypoattenuation of the periventricular white matter, most commonly indicating chronic ischemic microangiopathy. Vascular: No abnormal hyperdensity of the major intracranial arteries or dural venous sinuses. No intracranial atherosclerosis. Skull: The visualized skull base, calvarium and  extracranial soft tissues are normal. Sinuses/Orbits: No fluid levels or advanced mucosal thickening of the visualized paranasal sinuses. No mastoid or middle ear effusion. The orbits are normal. ASPECTS [facility] Stroke Program Early CT Score) - Ganglionic level infarction (caudate, lentiform nuclei, internal capsule, insula, M1-M3 cortex): 7 - Supraganglionic infarction (M4-M6 cortex): 3 Total score (0-10 with 10 being normal): 10 IMPRESSION: 1. No acute hemorrhage. 2. ASPECTS is 10. These results  were communicated to Dr. Karena Addison Rmoni Keplinger at 3:15 am on 10/19/2018 by text page via the [facility] messaging system. Electronically Signed   By: Ulyses Jarred M.D.   On: 10/19/2018 03:15     ASSESSMENT AND PLAN  58 y.o. female with past medical history of coronary artery disease with MI, PEA cardiac arrest, chronic diastolic congestive heart failure, chronic kidney disease, depression with suicidal ideations, hypertension, hyperlipidemia, type 2 diabetes mellitus, polysubstance abuse who presents to the emergency department after family heard her fall at 2:20 AM and noted patient was altered.  Initially prior as a code stroke for possible left facial droop and left-sided weakness although initial evaluation patient appeared to be generally weak and not cooperative with exam, no clear facial droop was appreciated.  Stat MRI was negative for acute stroke.  Patient does have remote history of seizures per chart review, although this has not been evaluated in the past.  Patient has history of drug abuse with positive amphetamines and benzos ( on valium) and is unclear if patient here taking illicit drugs versus increased dose of benzos.  Acute toxic or metabolic encephalopathy (urine drug screen and urinalysis pending at this time) Possible seizure with postictal state   Recommendations Urine drug screen, urinalysis, CBC, BMP Recommend routine EEG PT/OT Right hip x-ray  Neurology will continue to  follow   [place] Pager Number [phone]

## 2018-10-19 NOTE — ED Triage Notes (Signed)
Pt brought to ED by [facility] from home for a code stroke, pt LSW 02:25 fell from standing point at 02:26 and hit her head on the wall, pt mostly unresponsive on EMS arrival to her house with left side facial droop and slurred speech. BP 143/90,HR 60, R 20, SPO2 96% on NRM. CBG 107. Pt is a O2 Home dependent.

## 2018-10-21 ENCOUNTER — Telehealth: Payer: Self-pay | Admitting: Internal Medicine

## 2018-10-21 ENCOUNTER — Ambulatory Visit: Payer: Medicare HMO | Admitting: Registered Nurse

## 2018-10-21 NOTE — Telephone Encounter (Signed)
Returned called to patient who had requested a copy of the COVID test that had been performed while she was in quarantine approximately 1 month ago.  Per report of Erick Colace, Licensed conveyancer at the [facility]., pt is able to pickup a copy of the lab results from the front desk of the [facility]  She plans on retrieving this report on 10/6 or 10/7.  Gonzella Lex, NP-C

## 2018-10-22 ENCOUNTER — Emergency Department (HOSPITAL_COMMUNITY): Payer: Medicare HMO

## 2018-10-22 ENCOUNTER — Other Ambulatory Visit: Payer: Self-pay

## 2018-10-22 ENCOUNTER — Emergency Department (HOSPITAL_COMMUNITY)
Admission: EM | Admit: 2018-10-22 | Discharge: 2018-10-22 | Disposition: A | Discharge status: Home / Self Care | Payer: Medicare HMO | Attending: Emergency Medicine | Admitting: Emergency Medicine

## 2018-10-22 ENCOUNTER — Encounter (HOSPITAL_COMMUNITY): Payer: Self-pay

## 2018-10-22 ENCOUNTER — Emergency Department (HOSPITAL_COMMUNITY)
Admission: EM | Admit: 2018-10-22 | Discharge: 2018-10-22 | Disposition: A | Discharge status: Home / Self Care | Payer: Medicare HMO | Source: Home / Self Care | Attending: Emergency Medicine | Admitting: Emergency Medicine

## 2018-10-22 ENCOUNTER — Ambulatory Visit: Payer: Medicare HMO | Admitting: Registered Nurse

## 2018-10-22 DIAGNOSIS — N183 Chronic kidney disease, stage 3 unspecified: Secondary | ICD-10-CM | POA: Insufficient documentation

## 2018-10-22 DIAGNOSIS — Z5321 Procedure and treatment not carried out due to patient leaving prior to being seen by health care provider: Secondary | ICD-10-CM | POA: Insufficient documentation

## 2018-10-22 DIAGNOSIS — F250 Schizoaffective disorder, bipolar type: Secondary | ICD-10-CM | POA: Insufficient documentation

## 2018-10-22 DIAGNOSIS — R5383 Other fatigue: Secondary | ICD-10-CM | POA: Diagnosis not present

## 2018-10-22 DIAGNOSIS — R4182 Altered mental status, unspecified: Secondary | ICD-10-CM | POA: Insufficient documentation

## 2018-10-22 DIAGNOSIS — Z7984 Long term (current) use of oral hypoglycemic drugs: Secondary | ICD-10-CM | POA: Insufficient documentation

## 2018-10-22 DIAGNOSIS — F209 Schizophrenia, unspecified: Secondary | ICD-10-CM | POA: Insufficient documentation

## 2018-10-22 DIAGNOSIS — F419 Anxiety disorder, unspecified: Secondary | ICD-10-CM

## 2018-10-22 DIAGNOSIS — R4020 Unspecified coma: Secondary | ICD-10-CM | POA: Diagnosis not present

## 2018-10-22 DIAGNOSIS — R531 Weakness: Secondary | ICD-10-CM | POA: Diagnosis not present

## 2018-10-22 DIAGNOSIS — F29 Unspecified psychosis not due to a substance or known physiological condition: Secondary | ICD-10-CM | POA: Diagnosis not present

## 2018-10-22 DIAGNOSIS — I130 Hypertensive heart and chronic kidney disease with heart failure and stage 1 through stage 4 chronic kidney disease, or unspecified chronic kidney disease: Secondary | ICD-10-CM | POA: Insufficient documentation

## 2018-10-22 DIAGNOSIS — F22 Delusional disorders: Secondary | ICD-10-CM | POA: Insufficient documentation

## 2018-10-22 DIAGNOSIS — E1122 Type 2 diabetes mellitus with diabetic chronic kidney disease: Secondary | ICD-10-CM | POA: Insufficient documentation

## 2018-10-22 DIAGNOSIS — I5032 Chronic diastolic (congestive) heart failure: Secondary | ICD-10-CM | POA: Insufficient documentation

## 2018-10-22 DIAGNOSIS — Z7982 Long term (current) use of aspirin: Secondary | ICD-10-CM | POA: Insufficient documentation

## 2018-10-22 DIAGNOSIS — R6883 Chills (without fever): Secondary | ICD-10-CM | POA: Diagnosis not present

## 2018-10-22 DIAGNOSIS — E1140 Type 2 diabetes mellitus with diabetic neuropathy, unspecified: Secondary | ICD-10-CM | POA: Insufficient documentation

## 2018-10-22 DIAGNOSIS — Z79899 Other long term (current) drug therapy: Secondary | ICD-10-CM | POA: Insufficient documentation

## 2018-10-22 DIAGNOSIS — Z87891 Personal history of nicotine dependence: Secondary | ICD-10-CM | POA: Insufficient documentation

## 2018-10-22 LAB — CBC WITH DIFFERENTIAL/PLATELET
Abs Immature Granulocytes: 0.02 10*3/uL (ref 0.00–0.07)
Basophils Absolute: 0 10*3/uL (ref 0.0–0.1)
Basophils Relative: 0 %
Eosinophils Absolute: 0.1 10*3/uL (ref 0.0–0.5)
Eosinophils Relative: 2 %
HCT: 30.8 % — ABNORMAL LOW (ref 36.0–46.0)
Hemoglobin: 9.2 g/dL — ABNORMAL LOW (ref 12.0–15.0)
Immature Granulocytes: 0 %
Lymphocytes Relative: 23 %
Lymphs Abs: 1.3 10*3/uL (ref 0.7–4.0)
MCH: 28.1 pg (ref 26.0–34.0)
MCHC: 29.9 g/dL — ABNORMAL LOW (ref 30.0–36.0)
MCV: 94.2 fL (ref 80.0–100.0)
Monocytes Absolute: 0.3 10*3/uL (ref 0.1–1.0)
Monocytes Relative: 5 %
Neutro Abs: 4 10*3/uL (ref 1.7–7.7)
Neutrophils Relative %: 70 %
Platelets: 301 10*3/uL (ref 150–400)
RBC: 3.27 MIL/uL — ABNORMAL LOW (ref 3.87–5.11)
RDW: 14.1 % (ref 11.5–15.5)
WBC: 5.7 10*3/uL (ref 4.0–10.5)
nRBC: 0 % (ref 0.0–0.2)

## 2018-10-22 LAB — COMPREHENSIVE METABOLIC PANEL
ALT: 17 U/L (ref 0–44)
AST: 22 U/L (ref 15–41)
Albumin: 4.4 g/dL (ref 3.5–5.0)
Alkaline Phosphatase: 105 U/L (ref 38–126)
Anion gap: 9 (ref 5–15)
BUN: 17 mg/dL (ref 6–20)
CO2: 24 mmol/L (ref 22–32)
Calcium: 9.6 mg/dL (ref 8.9–10.3)
Chloride: 108 mmol/L (ref 98–111)
Creatinine, Ser: 1.39 mg/dL — ABNORMAL HIGH (ref 0.44–1.00)
GFR calc Af Amer: 48 mL/min — ABNORMAL LOW (ref >60)
GFR calc non Af Amer: 41 mL/min — ABNORMAL LOW (ref >60)
Glucose, Bld: 86 mg/dL (ref 70–99)
Potassium: 3.6 mmol/L (ref 3.5–5.1)
Sodium: 141 mmol/L (ref 135–145)
Total Bilirubin: 0.6 mg/dL (ref 0.3–1.2)
Total Protein: 7.6 g/dL (ref 6.5–8.1)

## 2018-10-22 NOTE — ED Triage Notes (Signed)
Per EMS- Patient was in a dispute with her mother and is paranoid. Patient voicing that people were out to get her. EMS stated that the patient would not allow them to get her BP and yelling that the Police was going to shoot her.

## 2018-10-22 NOTE — Progress Notes (Signed)
[name] met with pt who was pleasant and began to discuss her psychiatrist and the meds prescribed to her.  [name] offered counseling resources for anxiety, but pt turned them down and instead stated that her PCP would provide her with any needed resources and stated she would only accept suggestions from that PCP, as well.  Pt tended to speak incessantly about her psychiatrist and when [name] attempted to redirect X 3 the pt X# stated, "Just listen".  [name] provided active listening, but when pt behan to repeat herself, thanked the pt/pt's mother for their time and stated [name] would be standing by should any new social work needs arise.  Pt's mother only nodded in the affirmative and pt then asked, "Can I go home now?".  [name] stated he would forward question to pt's EDP and again stated [name] would be standing by should any new social work needs arise.  EDP updated.  [name] will continue to follow for D/C needs.  Alphonse Guild. Aradhana Gin, LCSW, LCAS, CSI Transitions of Care Clinical Social Worker Care Coordination Department Ph: [phone]
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
## 2018-10-22 NOTE — ED Triage Notes (Signed)
Seen here today for the same complaint and now called EMS to bring her back due to anxiety no SI\HI.

## 2018-10-22 NOTE — ED Notes (Signed)
No respiratory or acute distress noted alert and oriented x 3 sitter with pt call light in reach.

## 2018-10-24 DIAGNOSIS — J449 Chronic obstructive pulmonary disease, unspecified: Secondary | ICD-10-CM | POA: Diagnosis not present

## 2018-10-25 ENCOUNTER — Telehealth (INDEPENDENT_AMBULATORY_CARE_PROVIDER_SITE_OTHER): Payer: Medicare HMO | Admitting: Registered Nurse

## 2018-10-25 DIAGNOSIS — M109 Gout, unspecified: Secondary | ICD-10-CM

## 2018-10-25 DIAGNOSIS — E1142 Type 2 diabetes mellitus with diabetic polyneuropathy: Secondary | ICD-10-CM

## 2018-10-25 DIAGNOSIS — R0789 Other chest pain: Secondary | ICD-10-CM

## 2018-10-25 DIAGNOSIS — N3281 Overactive bladder: Secondary | ICD-10-CM

## 2018-10-25 MED ORDER — GABAPENTIN 600 MG PO TABS: 600.0000 mg | ORAL_TABLET | Freq: Two times a day (BID) | ORAL | 1 refills | Status: DC

## 2018-10-25 MED ORDER — CYCLOBENZAPRINE HCL 5 MG PO TABS: 5.0000 mg | ORAL_TABLET | Freq: Three times a day (TID) | ORAL | 0 refills | Status: DC | PRN

## 2018-10-25 MED ORDER — COLCHICINE 0.6 MG PO TABS: ORAL_TABLET | ORAL | 3 refills | Status: DC

## 2018-10-25 MED ORDER — OXYBUTYNIN CHLORIDE ER 10 MG PO TB24: 10.0000 mg | ORAL_TABLET | Freq: Every day | ORAL | 1 refills | Status: DC

## 2018-10-25 NOTE — Progress Notes (Signed)
Spoke with pt about her health concerns and medication concerns. She may need refills and total follow-up about concerns.  She states that she has been having tingling pain in her fingers. She has been fatigue. She states the pain is very intense.

## 2018-10-26 LAB — URINE CULTURE: Culture: >=100000 — AB

## 2018-10-27 ENCOUNTER — Telehealth: Payer: Self-pay | Admitting: Emergency Medicine

## 2018-10-27 NOTE — Telephone Encounter (Signed)
Post ED Visit - Positive Culture Follow-up  Culture report reviewed by antimicrobial stewardship pharmacist: [place] Team []  Elenor Quinones, Pharm.D. []  Heide Guile, Pharm.D., BCPS AQ-ID []  Parks Neptune, Pharm.D., BCPS []  Alycia Rossetti, Pharm.D., BCPS []  [place], [place].D., BCPS, AAHIVP []  Legrand Como, Pharm.D., BCPS, AAHIVP []  Salome Arnt, PharmD, BCPS []  Johnnette Gourd, PharmD, BCPS []  Hughes Better, PharmD, BCPS []  Leeroy Cha, PharmD []  Laqueta Linden, PharmD, BCPS []  Albertina Parr, PharmD  [place] Team [x]  Leodis Sias, PharmD []  Lindell Spar, PharmD []  Royetta Asal, PharmD []  Graylin Shiver, Rph []  Rema Fendt) Glennon Mac, PharmD []  Arlyn Dunning, PharmD []  Netta Cedars, PharmD []  Dia Sitter, PharmD []  Leone Haven, PharmD []  Gretta Arab, PharmD []  Theodis Shove, PharmD []  Peggyann Juba, PharmD []  Reuel Boom, PharmD   Positive urine culture No further patient follow-up is required at this time.  Sandi Raveling Ulus Hazen 10/27/2018, 2:07 PM
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
## 2018-10-28 ENCOUNTER — Other Ambulatory Visit: Payer: Self-pay | Admitting: Family Medicine

## 2018-10-28 NOTE — Progress Notes (Deleted)
Cardiology Office Note:    Date:  10/28/2018   ID:  Tiffany [name], DOB June 19, 1960, MRN [id number]  PCP:  Maximiano Coss, NP  Cardiologist:  Fransico Him, MD    Referring MD: Maximiano Coss, NP   No chief complaint on file.   History of Present Illness:    Tiffany [name] is a 58 y.o. female with a hx of ***  Past Medical History:  Diagnosis Date  . Agitation 11/22/2017  . AKI (acute kidney injury) ([place]) 11/21/2017  . Anemia 04/24/2011  . Anoxic brain injury ([place]) 09/08/2016   C. Arrest due to respiratory failure and COPD exacerbation  . Anxiety   . Arthritis    "all over" (04/10/2016)  . Asthma 10/18/2010  . Binge eating disorder   . Cardiac arrest ([employer]) 09/08/2016   PEA  . Carotid artery stenosis    1-39% bilateral by dopplers 11/2016  . Chronic bronchitis ([place])   . Chronic diastolic (congestive) heart failure ([place])   . Chronic kidney disease    "I see a kidney dr." (04/10/2016)  . Chronic pain syndrome 06/18/2012  . Chronic post-traumatic stress disorder (PTSD) 05/27/2018  . Chronic respiratory failure with hypoxia and hypercapnia (HCC) 06/22/2015   TRILOGY Vent >AVAPA-ES., Vt target 200-400, Max P 30 , PS max 20 , PS min 6-10 , E Max 6, E Min 4, Rate Auto AVAPS Rate 2 (titrate for pt comfort) , bleed O2 at 5l/m continuous flow .   [name] CKD (chronic kidney disease) stage 3, GFR 30-59 ml/min 12/15/2016  . Closed displaced fracture of fifth metacarpal bone 03/21/2018  . Cocaine use disorder, severe, in sustained remission ([place]) 12/17/2015  . Complication of anesthesia    decreased bp, decreased heart rate  . Constipation   . COPD (chronic obstructive pulmonary disease) ([place]) 07/08/2014  . Depression   . Diabetic neuropathy ([place]) 04/24/2011  . Difficulty with speech 01/24/2018  . Disorder of nervous system   . Drug abuse ([place]) 11/21/2017  . Dyslipidemia 04/24/2011  . Dyspnea   . Elevated troponin 04/28/2012  . Emphysema   . Encephalopathy 11/21/2017  . Essential hypertension 03/22/2016   . Fall   . Fibula fracture 07/10/2016  . Frequent falls 10/11/2017  . GERD (gastroesophageal reflux disease)   . Gout 04/11/2017  . Heart attack ([place]) 1980s  . History of blood transfusion 1994   "couldn't stop bleeding from my period"  . History of drug abuse in remission ([place]) 11/28/2015   Quit in 2017  . Hyperlipidemia LDL goal <70   . Hypertension   . Incontinence   . Manic depression ([place])   . Morbid obesity ([place]) 10/18/2010  . Obstructive sleep apnea 10/18/2010  . On home oxygen therapy    "6L; 24/7" (04/10/2016)  . OSA on CPAP    "wear mask sometimes" (04/10/2016)  . Paranoid ([place])    "sometimes; I'm on RX for it" (04/10/2016)  . Pneumonia    "I've had it several times; haven't had it since 06/2015" (04/10/2016)  . Prolonged Q-T interval on ECG   . QT prolongation   . Rectal bleeding 12/31/2015  . Right carotid bruit 11/09/2016  . Right foot pain 11/15/2016  . Scabies 06/01/2015  . Schizoaffective disorder, bipolar type ([place]) 04/05/2018  . Seasonal allergies   . Seborrheic keratoses 12/31/2013  . Seizure ([place]) 01/04/2013  . Seizures ([place])    "don't know what kind; last one was ~ 1 yr ago" (04/10/2016)  . Sinus trouble   .  Skin irritation 04/07/2012  . SOB (shortness of breath) 04/10/2016  . Stroke [facility]) 1980s   denies residual on 04/10/2016  . Syncope 01/06/2017  . Thrush 09/19/2013  . Tobacco use disorder 07/22/2014  . Type 2 diabetes mellitus ([place]) 10/18/2010  . Type II diabetes mellitus ([place])   . Upper abdominal pain   . Vaginal discharge 01/03/2018  . Wheezing 04/12/2016  . Wound, breast 01/03/2018    Past Surgical History:  Procedure Laterality Date  . CESAREAN SECTION  1997  . HERNIA REPAIR    . IR CHOLANGIOGRAM EXISTING TUBE  07/20/2016  . IR PERC CHOLECYSTOSTOMY  05/10/2016  . IR RADIOLOGIST EVAL & MGMT  06/08/2016  . IR RADIOLOGIST EVAL & MGMT  06/29/2016  . IR SINUS/FIST TUBE CHK-NON GI  07/12/2016  . RIGHT/LEFT HEART CATH AND CORONARY ANGIOGRAPHY N/A 06/19/2017    Procedure: RIGHT/LEFT HEART CATH AND CORONARY ANGIOGRAPHY;  Surgeon: Jolaine Artist, MD;  Location: [facility];  Service: Cardiovascular;  Laterality: N/A;  . TIBIA IM NAIL INSERTION Right 07/12/2016   Procedure: INTRAMEDULLARY (IM) NAIL RIGHT TIBIA;  Surgeon: Leandrew Koyanagi, MD;  Location: [facility];  Service: Orthopedics;  Laterality: Right;  . UMBILICAL HERNIA REPAIR  ~ 1963   "that's why I don't have a belly button"  . VAGINAL HYSTERECTOMY      Current Medications: No outpatient medications have been marked as taking for the 10/29/18 encounter (Appointment) with Sueanne Margarita, MD.     Allergies:   Hydrocodone, Hydrocodone-acetaminophen, Hydroxyzine, Latuda [lurasidone hcl], Lurasidone, Magnesium-containing compounds, Prednisone, Tramadol, Codeine, Other, Sulfa antibiotics, and Tape   Social History   Socioeconomic History  . Marital status: Widowed    Spouse name: Not on file  . Number of children: 3  . Years of education: Not on file  . Highest education level: Not on file  Occupational History  . 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 Financial resource strain: Not on file  . Food insecurity    Worry: Sometimes true    Inability: Never true  . Transportation needs    Medical: No    Non-medical: No  Tobacco Use  . Smoking status: Former Smoker    Packs/day: 1.50    Years: 38.00    Pack years: 57.00    Types: Cigarettes    Start date: 03/13/1977    Quit date: 04/10/2016    Years since quitting: 2.5  . Smokeless tobacco: Never Used  Substance and Sexual Activity  . Alcohol use: No    Alcohol/week: 0.0 standard drinks  . Drug use: No    Types: Cocaine    Comment: 04/10/2016 "last used cocaine back in November 2017"  . Sexual activity: Not Currently    Birth control/protection: Surgical  Lifestyle  . Physical activity    Days per week: Not on file    Minutes per session: Not on file  . Stress: Not on file  Relationships  .  Social Herbalist on phone: Not on file    Gets together: Not on file    Attends religious service: Not on file    Active member of club or organization: Not on file    Attends meetings of clubs or organizations: Not on file    Relationship status: Not on file  Other Topics Concern  . Not on file  Social History Narrative   Has 1 son, [name]   Lives with son and his  boyfriend   Her house has ramps and handrails should she ever needs them.    Her mother lives down the street from her and is a good support person in addition to her son.   She drives herself, has private transportation.    Cocaine free since 02/24/16, smoke free since 04/10/16     Family History: The patient's ***family history includes Anxiety disorder in her sister; Bipolar disorder in her sister; Cancer in her father and mother; Depression in her brother, mother, sister, and sister; Heart failure in an other family member; Schizophrenia in her sister.  ROS:   Please see the history of present illness.    ROS  All other systems reviewed and negative.   EKGs/Labs/Other Studies Reviewed:    The following studies were reviewed today: ***  EKG:  EKG is *** ordered today.  The ekg ordered today demonstrates ***  Recent Labs: 10/07/2018: Magnesium 2.0 10/22/2018: ALT 17; BUN 17; Creatinine, Ser 1.39; Hemoglobin 9.2; Platelets 301; Potassium 3.6; Sodium 141   Recent Lipid Panel    Component Value Date/Time   CHOL 127 08/05/2018 1748   TRIG 53 08/05/2018 1748   HDL 62 08/05/2018 1748   CHOLHDL 2.0 08/05/2018 1748   CHOLHDL 3.7 01/07/2017 0005   VLDL 38 01/07/2017 0005   LDLCALC 54 08/05/2018 1748   LDLDIRECT 52 04/05/2012 1210    Physical Exam:    VS:  There were no vitals taken for this visit.    Wt Readings from Last 3 Encounters:  10/22/18 153 lb (69.4 kg)  10/22/18 153 lb (69.4 kg)  10/19/18 153 lb (69.4 kg)     GEN: *** Well nourished, well developed in no acute distress HEENT: Normal  NECK: No JVD; No carotid bruits LYMPHATICS: No lymphadenopathy CARDIAC: ***RRR, no murmurs, rubs, gallops RESPIRATORY:  Clear to auscultation without rales, wheezing or rhonchi  ABDOMEN: Soft, non-tender, non-distended MUSCULOSKELETAL:  No edema; No deformity  SKIN: Warm and dry NEUROLOGIC:  Alert and oriented x 3 PSYCHIATRIC:  Normal affect   ASSESSMENT:    No diagnosis found. PLAN:    In order of problems listed above:  ***   Medication Adjustments/Labs and Tests Ordered: Current medicines are reviewed at length with the patient today.  Concerns regarding medicines are outlined above.  No orders of the defined types were placed in this encounter.  No orders of the defined types were placed in this encounter.   Signed, Fransico Him, MD  10/28/2018 4:53 PM    [facility]

## 2018-10-29 ENCOUNTER — Other Ambulatory Visit: Payer: Self-pay | Admitting: Registered Nurse

## 2018-10-29 ENCOUNTER — Other Ambulatory Visit (HOSPITAL_COMMUNITY): Payer: Self-pay | Admitting: Psychiatry

## 2018-10-29 ENCOUNTER — Telehealth (HOSPITAL_COMMUNITY): Payer: Self-pay

## 2018-10-29 ENCOUNTER — Ambulatory Visit: Payer: Medicare HMO | Admitting: Cardiology

## 2018-10-29 MED ORDER — ESCITALOPRAM OXALATE 20 MG PO TABS: 20.0000 mg | ORAL_TABLET | Freq: Every day | ORAL | 0 refills | Status: DC
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
## 2018-10-29 NOTE — Telephone Encounter (Signed)
Patient is calling, she was recently in the ED for suspected stroke. Patients medications were changed. They did the following: Increased the Saphris to 10 mg TID D/c Vyvanse D/c escitalopram Decrease Valium to 5 mg bid  Patient is scared, paranoid and agitated. Please review and advise, thank you

## 2018-10-29 NOTE — Telephone Encounter (Signed)
These changes were made not in ED but on a geropsychiatry unit in [place] when she was hospitalized there in September and according to their notes she was doing much better at discharge although paranoid delusions seem to be her baseline. They increased her Saphris dose, decreased Valium and stopped Vyvanse - .I think she should stay off Vyvanse and a lower dose of diazepam is good was well, I am not sure how Lexapro was lost in all that - she should resume it but at 20 mg not 40 which she was on in early September.

## 2018-10-30 ENCOUNTER — Telehealth: Payer: Self-pay | Admitting: Registered Nurse

## 2018-10-30 ENCOUNTER — Other Ambulatory Visit: Payer: Medicare HMO | Admitting: Internal Medicine

## 2018-10-30 ENCOUNTER — Other Ambulatory Visit: Payer: Self-pay

## 2018-10-30 ENCOUNTER — Ambulatory Visit: Payer: Medicare HMO | Admitting: Registered Nurse

## 2018-10-30 ENCOUNTER — Inpatient Hospital Stay
Admit: 2018-10-30 | Discharge: 2018-10-30 | Discharge status: Against Medical Advice | Payer: MEDICARE | Attending: Emergency Medicine

## 2018-10-30 ENCOUNTER — Emergency Department: Admit: 2018-10-30 | Payer: MEDICARE

## 2018-10-30 DIAGNOSIS — R531 Weakness: Secondary | ICD-10-CM

## 2018-10-30 DIAGNOSIS — J9611 Chronic respiratory failure with hypoxia: Secondary | ICD-10-CM | POA: Diagnosis not present

## 2018-10-30 DIAGNOSIS — Z8673 Personal history of transient ischemic attack (TIA), and cerebral infarction without residual deficits: Secondary | ICD-10-CM | POA: Diagnosis not present

## 2018-10-30 DIAGNOSIS — R29708 NIHSS score 8: Secondary | ICD-10-CM | POA: Diagnosis not present

## 2018-10-30 DIAGNOSIS — I10 Essential (primary) hypertension: Secondary | ICD-10-CM | POA: Diagnosis not present

## 2018-10-30 DIAGNOSIS — I639 Cerebral infarction, unspecified: Secondary | ICD-10-CM | POA: Diagnosis not present

## 2018-10-30 DIAGNOSIS — R0602 Shortness of breath: Secondary | ICD-10-CM | POA: Diagnosis not present

## 2018-10-30 DIAGNOSIS — J449 Chronic obstructive pulmonary disease, unspecified: Secondary | ICD-10-CM | POA: Diagnosis not present

## 2018-10-30 DIAGNOSIS — J9612 Chronic respiratory failure with hypercapnia: Secondary | ICD-10-CM | POA: Diagnosis not present

## 2018-10-30 DIAGNOSIS — R404 Transient alteration of awareness: Secondary | ICD-10-CM | POA: Diagnosis not present

## 2018-10-30 DIAGNOSIS — Z5329 Procedure and treatment not carried out because of patient's decision for other reasons: Secondary | ICD-10-CM | POA: Diagnosis not present

## 2018-10-30 LAB — CBC WITH AUTO DIFFERENTIAL
Basophils %: 0 % (ref 0–1)
Basophils Absolute: 0 10*3/uL (ref 0.0–0.1)
Eosinophils %: 4 % (ref 0–7)
Eosinophils Absolute: 0.2 10*3/uL (ref 0.0–0.4)
Granulocyte Absolute Count: 0 10*3/uL (ref 0.00–0.04)
Hematocrit: 30.5 % — ABNORMAL LOW (ref 35.0–47.0)
Hemoglobin: 9.4 g/dL — ABNORMAL LOW (ref 11.5–16.0)
Immature Granulocytes: 0 % (ref 0.0–0.5)
Lymphocytes %: 40 % (ref 12–49)
Lymphocytes Absolute: 2.4 10*3/uL (ref 0.8–3.5)
MCH: 27.8 PG (ref 26.0–34.0)
MCHC: 30.8 g/dL (ref 30.0–36.5)
MCV: 90.2 FL (ref 80.0–99.0)
MPV: 9.2 FL (ref 8.9–12.9)
Monocytes %: 6 % (ref 5–13)
Monocytes Absolute: 0.3 10*3/uL (ref 0.0–1.0)
Neutrophils %: 50 % (ref 32–75)
Neutrophils Absolute: 3 10*3/uL (ref 1.8–8.0)
Platelets: 348 10*3/uL (ref 150–400)
RBC: 3.38 M/uL — ABNORMAL LOW (ref 3.80–5.20)
RDW: 15.2 % — ABNORMAL HIGH (ref 11.5–14.5)
WBC: 6 10*3/uL (ref 3.6–11.0)

## 2018-10-30 LAB — POCT GLUCOSE: POC Glucose: 105 mg/dL — ABNORMAL HIGH (ref 65–100)

## 2018-10-30 LAB — COMPREHENSIVE METABOLIC PANEL
ALT: 17 U/L (ref 12–78)
AST: 18 U/L (ref 15–37)
Albumin/Globulin Ratio: 1 — ABNORMAL LOW (ref 1.1–2.2)
Albumin: 3.8 g/dL (ref 3.5–5.0)
Alkaline Phosphatase: 151 U/L — ABNORMAL HIGH (ref 45–117)
Anion Gap: 6 mmol/L (ref 5–15)
BUN: 9 mg/dL (ref 6–20)
Bun/Cre Ratio: 6 — ABNORMAL LOW (ref 12–20)
CO2: 28 mmol/L (ref 21–32)
Calcium: 9.1 mg/dL (ref 8.5–10.1)
Chloride: 110 mmol/L — ABNORMAL HIGH (ref 97–108)
Creatinine: 1.48 mg/dL — ABNORMAL HIGH (ref 0.55–1.02)
EGFR IF NonAfrican American: 36 mL/min/{1.73_m2} — ABNORMAL LOW (ref >60)
GFR African American: 44 mL/min/{1.73_m2} — ABNORMAL LOW (ref >60)
Globulin: 3.8 g/dL (ref 2.0–4.0)
Glucose: 54 mg/dL — ABNORMAL LOW (ref 65–100)
Potassium: 3.1 mmol/L — ABNORMAL LOW (ref 3.5–5.1)
Sodium: 144 mmol/L (ref 136–145)
Total Bilirubin: 0.3 mg/dL (ref 0.2–1.0)
Total Protein: 7.6 g/dL (ref 6.4–8.2)

## 2018-10-30 LAB — PROTIME-INR
INR: 1 (ref 0.9–1.1)
Protime: 13 s (ref 11.9–14.7)

## 2018-10-30 LAB — TROPONIN: Troponin I: <0.05 ng/mL (ref <0.05)

## 2018-10-30 LAB — CBC WITH AUTOMATED DIFF
ABS. BASOPHILS: 0 10*3/uL (ref 0.0–0.1)
ABS. EOSINOPHILS: 0.2 10*3/uL (ref 0.0–0.4)
ABS. IMM. GRANS.: 0 10*3/uL (ref 0.00–0.04)
ABS. LYMPHOCYTES: 2.4 10*3/uL (ref 0.8–3.5)
ABS. MONOCYTES: 0.3 10*3/uL (ref 0.0–1.0)
ABS. NEUTROPHILS: 3 10*3/uL (ref 1.8–8.0)
BASOPHILS: 0 % (ref 0–1)
EOSINOPHILS: 4 % (ref 0–7)
HCT: 30.5 % — ABNORMAL LOW (ref 35.0–47.0)
HGB: 9.4 g/dL — ABNORMAL LOW (ref 11.5–16.0)
IMMATURE GRANULOCYTES: 0 % (ref 0.0–0.5)
LYMPHOCYTES: 40 % (ref 12–49)
MCH: 27.8 PG (ref 26.0–34.0)
MCHC: 30.8 g/dL (ref 30.0–36.5)
MCV: 90.2 FL (ref 80.0–99.0)
MONOCYTES: 6 % (ref 5–13)
MPV: 9.2 FL (ref 8.9–12.9)
NEUTROPHILS: 50 % (ref 32–75)
PLATELET: 348 10*3/uL (ref 150–400)
RBC: 3.38 M/uL — ABNORMAL LOW (ref 3.80–5.20)
RDW: 15.2 % — ABNORMAL HIGH (ref 11.5–14.5)
WBC: 6 10*3/uL (ref 3.6–11.0)

## 2018-10-30 LAB — METABOLIC PANEL, COMPREHENSIVE
A-G Ratio: 1 — ABNORMAL LOW (ref 1.1–2.2)
ALT (SGPT): 17 U/L (ref 12–78)
AST (SGOT): 18 U/L (ref 15–37)
Albumin: 3.8 g/dL (ref 3.5–5.0)
Alk. phosphatase: 151 U/L — ABNORMAL HIGH (ref 45–117)
Anion gap: 6 mmol/L (ref 5–15)
BUN/Creatinine ratio: 6 — ABNORMAL LOW (ref 12–20)
BUN: 9 mg/dL (ref 6–20)
Bilirubin, total: 0.3 mg/dL (ref 0.2–1.0)
CO2: 28 mmol/L (ref 21–32)
Calcium: 9.1 mg/dL (ref 8.5–10.1)
Chloride: 110 mmol/L — ABNORMAL HIGH (ref 97–108)
Creatinine: 1.48 mg/dL — ABNORMAL HIGH (ref 0.55–1.02)
GFR est AA: 44 mL/min/{1.73_m2} — ABNORMAL LOW (ref >60)
GFR est non-AA: 36 mL/min/{1.73_m2} — ABNORMAL LOW (ref >60)
Globulin: 3.8 g/dL (ref 2.0–4.0)
Glucose: 54 mg/dL — ABNORMAL LOW (ref 65–100)
Potassium: 3.1 mmol/L — ABNORMAL LOW (ref 3.5–5.1)
Protein, total: 7.6 g/dL (ref 6.4–8.2)
Sodium: 144 mmol/L (ref 136–145)

## 2018-10-30 LAB — PROTHROMBIN TIME + INR
INR: 1 (ref 0.9–1.1)
Prothrombin time: 13 s (ref 11.9–14.7)

## 2018-10-30 LAB — GLUCOSE, POC: Glucose (POC): 105 mg/dL — ABNORMAL HIGH (ref 65–100)

## 2018-10-30 LAB — TROPONIN I: Troponin-I, Qt.: <0.05 ng/mL (ref <0.05)

## 2018-10-30 MED ORDER — ACETAMINOPHEN 325 MG TABLET
325 mg | Freq: Four times a day (QID) | ORAL | Status: DC | PRN
Start: 2018-10-30 — End: 2018-10-30

## 2018-10-30 MED ORDER — MORPHINE 2 MG/ML INJECTION
2 mg/mL | INTRAMUSCULAR | Status: DC | PRN
Start: 2018-10-30 — End: 2018-10-30

## 2018-10-30 MED ORDER — ONDANSETRON (PF) 4 MG/2 ML INJECTION
4 mg/2 mL | INTRAMUSCULAR | Status: DC | PRN
Start: 2018-10-30 — End: 2018-10-30

## 2018-10-30 NOTE — ED Provider Notes (Signed)
EMERGENCY DEPARTMENT HISTORY AND PHYSICAL EXAM      Date: 10/30/2018  Patient Name: Sarah Trujillo      History of Presenting Illness     Chief Complaint   Patient presents with   ??? Extremity Weakness       History Provided By: Patient    HPI: Emonie Espericueta, 58 y.o. female with a past medical history significant For stroke and hypertension presents to the ED with cc of generalized weakness and she describes arthritic pain in both hands.  She states this is been going on for over a week since she was [place]  Patient is a difficult historian mostly admitting to not wanting to answer questions.  Patient laying on stretcher with her eyes closed.  Attempting to ask about any new weakness she says yes however cannot elaborate and then asked further she says    There are no other complaints, changes, or physical findings at this time.    PCP: No primary care provider on file.        Past History     Past Medical History:  Past Medical History:   Diagnosis Date   ??? Hypertension    ??? Stroke [facility])        Past Surgical History:  Past Surgical History:   Procedure Laterality Date   ??? HX ORTHOPAEDIC         Family History:  History reviewed. No pertinent family history.    Social History:  Social History     Tobacco Use   ??? Smoking status: Former Smoker   ??? Smokeless tobacco: Never Used   Substance Use Topics   ??? Alcohol use: Never     Frequency: Never   ??? Drug use: Never       Allergies:  Allergies   Allergen Reactions   ??? Latuda [Lurasidone] Unknown (comments)   ??? Magnesium Unknown (comments)         Review of Systems      Review of Systems   Unable to perform ROS: Other   Constitutional: Positive for fatigue.   HENT: Negative for congestion.    Respiratory: Negative for chest tightness and shortness of breath.    Cardiovascular: Negative for chest pain and palpitations.   Gastrointestinal: Negative for abdominal pain, diarrhea, nausea and vomiting.    Genitourinary: Negative for difficulty urinating and dysuria.   Musculoskeletal: Positive for arthralgias, back pain, gait problem, joint swelling and myalgias.   Skin: Negative for color change and rash.   Neurological: Positive for dizziness, speech difficulty, weakness, light-headedness, numbness and headaches.   Psychiatric/Behavioral: Negative for confusion and decreased concentration.       Physical Exam      Physical Exam  Vitals signs and nursing note reviewed.   Constitutional:       General: [name] not in acute distress.  HENT:      Head: Normocephalic and atraumatic.      Nose: Nose normal.      Mouth/Throat:      Mouth: Mucous membranes are moist.      Pharynx: Oropharynx is clear.   Eyes:      Extraocular Movements: Extraocular movements intact.      Conjunctiva/sclera: Conjunctivae normal.      Pupils: Pupils are equal, round, and reactive to light.   Neck:      Comments: Pain with range of motion of the neck.  Cardiovascular:  Rate and Rhythm: Normal rate and regular rhythm.   Pulmonary:      Effort: Pulmonary effort is normal.      Breath sounds: Normal breath sounds.   Abdominal:      General: Abdomen is flat. Bowel sounds are normal.      Palpations: Abdomen is soft.   Skin:     General: Skin is warm and dry.      Capillary Refill: Capillary refill takes less than 2 seconds.   Neurological:      Mental Status: [name] alert.      Comments: Patient follows some commands says that she is too weak to move her arms but then is noted to move her arms without apparent difficulty.  Stated that she could not walk or bear weight but also states that she drove herself here.  States she is wheelchair-bound but drove herself here and arrived with a cane.   Psychiatric:      Comments: Patient seems very defensive.  When the nurse noted that she came in with a cane patient began yelling at the nurse stating that she was wheelchair-bound and could not walk.         Diagnostic Study Results     Labs -    No results found for this or any previous visit (from the past 12 hour(s)).    Radiologic Studies -   @LASTXRRESULT @  CT Results  (Last 48 hours)               10/30/18 0125  CT CODE NEURO HEAD WO CONTRAST Final result    Impression:  Impression: Negative exam. Report phoned to the emergency room at 1:33 AM               Narrative:  CT SCAN OF THE HEAD WITHOUT IV CONTRAST:       INDICATION: CVA       COMPARISON:None       The exam is negative for hemorrhage, infarct, mass lesion, midline shift or   abnormal extra-axial fluid collection. The ventricles and cisterns are normal.    The orbits, paranasal sinuses and temporal bones are normal.               CXR Results  (Last 48 hours)    None          Medical Decision Making and ED Course   I am the first provider for this patient.    I reviewed the vital signs, available nursing notes, past medical history, past surgical history, family history and social history.    Vital Signs-Reviewed the patient's vital signs.  No data found.          Provider Notes (Medical Decision Making):   58 year old female presents emergency department with generalized weakness.  Apparently reported to triage that she was weak on her left side however she reports to me that she is having bilateral arm pain associated with arthritis and that she is has weakness but it is been present since she had a stroke approximately a week ago.  Patient is a very difficult historian and though she does not have any overt focal abnormalities her effort is generally lacking does seem to be more pronounced on the left than the right.  SOC did see patient and recommended general management.  He also suspects more metabolic cause.  Patient was awaiting admission when apparently she decided to leave AGAINST MEDICAL ADVICE.  Before I could get to the room  to speak with her she had left.  Apparently refused to sign any paperwork.    MDM       ED Course:    Initial assessment performed. The patients presenting problems have been discussed, and they are in agreement with the care plan formulated and outlined with them.  I have encouraged them to ask questions as they arise throughout their visit.         CONSULTATIONS:  SOC, neurology, recommends admision, MRI for TIA workup    Dr. Gabriel Earing, will see for admission.            Disposition        AMA       Diagnosis     Clinical Impression:   1. Weakness    2. Transient alteration of awareness        Attestations:    Burnard Hawthorne, MD    Please note that this dictation was completed with Dragon, the computer voice recognition software.  Quite often unanticipated grammatical, syntax, homophones, and other interpretive errors are inadvertently transcribed by the computer software.  Please disregard these errors.  Please excuse any errors that have escaped final proofreading.  Thank you.

## 2018-10-30 NOTE — ED Notes (Signed)
During exam with neuro patient would not move her extremities but reports that she drove to the ED tonight, patient states that she is wheelchair bound and does not walk, brought walker to ED. Multiple statements made contradict each other yet patient is alert and oriented x 4 when neuro assessment done. Patient making comments about her care in a derogatory manner

## 2018-10-30 NOTE — ED Notes (Signed)
Writer asked to come to bedside by registration. Patient was up unplugged sitting on the side of the bed stating that she was cold. I offered to change rooms for patient as patient continued to state that "she is in a operating room". Patient offered more blankets, offered patient bear hugger pt declined. Pt states she is leaving AMA. Repeatedly asked patient to stay. Patient refused , and refused to sign ama papers. Patient ambulated to wheelchair. Interaction witnessed by A. Patron

## 2018-10-30 NOTE — ED Notes (Signed)
Writer asked to come to bedside by registration. Patient was up unplugged sitting on the side of the bed stating that she was cold. I offered to change rooms for patient as patient continued to state that "she is in a operating room". Patient offered more blankets, offered patient bear hugger pt declined. Pt states she is leaving AMA. Repeatedly asked patient to stay. Patient refused , and refused to sign ama papers. Patient ambulated to wheelchair. Interaction witnessed by A. Patron

## 2018-10-30 NOTE — ED Provider Notes (Signed)
ED Provider Notes by [name], MD at 10/30/18 0401                Author: [name], MD  Service: --  Author Type: Physician       Filed: 10/30/18 2227  Date of Service: 10/30/18 0401  Status: Signed          Editor: [name], MD (Physician)               EMERGENCY DEPARTMENT HISTORY AND PHYSICAL EXAM           Date: 10/30/2018   Patient Name: Sarah Trujillo           History of Presenting Illness          Chief Complaint       Patient presents with        ?  Extremity Weakness           History Provided By: Patient      HPI: Sarah Trujillo,  58 y.o. female with a past medical history significant  For stroke and hypertension presents to the ED with cc of generalized weakness and she describes arthritic pain in both hands.  She states this is been going on for over a week since she was diagnosed  with a [place].  Patient is a difficult historian mostly admitting to not wanting to answer questions.  Patient laying on stretcher with her eyes closed.  Attempting to ask about any new weakness she says yes however  cannot elaborate and then asked further she says      There are no other complaints, changes, or physical findings at this time.      PCP: No primary care provider on file.              Past History        Past Medical History:     Past Medical History:        Diagnosis  Date         ?  Hypertension           ?  Stroke [facility])             Past Surgical History:     Past Surgical History:         Procedure  Laterality  Date          ?  HX ORTHOPAEDIC               Family History:   History reviewed. No pertinent family history.      Social History:     Social History          Tobacco Use         ?  Smoking status:  Former Smoker     ?  Smokeless tobacco:  Never Used       Substance Use Topics         ?  Alcohol use:  Never              Frequency:  Never         ?  Drug use:  Never           Allergies:     Allergies        Allergen   Reactions         ?  Latuda [Lurasidone]  Unknown (comments)         ?  Magnesium  Unknown (comments)                Review of Systems         Review of Systems    Unable to perform ROS: Other     Constitutional: Positive for fatigue.    HENT: Negative for congestion.     Respiratory: Negative for chest tightness and shortness of breath.     Cardiovascular: Negative for chest pain and palpitations.    Gastrointestinal: Negative for abdominal pain, diarrhea, nausea and vomiting.    Genitourinary: Negative for difficulty urinating and dysuria.    Musculoskeletal: Positive for arthralgias, back pain , gait problem, joint swelling  and myalgias.    Skin: Negative for color change and rash.    Neurological: Positive for dizziness, speech difficulty , weakness, light-headedness , numbness and headaches .    Psychiatric/Behavioral: Negative for confusion and decreased concentration.            Physical Exam         Physical Exam   Vitals signs and nursing note reviewed.   Constitutional:        General: [name] not in acute distress.  HENT :       Head: Normocephalic and atraumatic.      Nose: Nose normal.      Mouth/Throat:      Mouth: Mucous membranes are moist.      Pharynx: Oropharynx is clear.   Eyes:       Extraocular Movements: Extraocular movements intact.      Conjunctiva/sclera: Conjunctivae normal.      Pupils: Pupils are equal, round, and reactive to light.   Neck:       Comments: Pain with range of motion of the neck.  Cardiovascular:       Rate and Rhythm: Normal rate and regular rhythm.   Pulmonary :       Effort: Pulmonary effort is normal.      Breath sounds: Normal breath sounds.   Abdominal :      General: Abdomen is flat. Bowel sounds are normal.      Palpations: Abdomen is soft.    Skin:      General: Skin is warm and dry.      Capillary Refill: Capillary refill takes less than 2 seconds.    Neurological:       Mental Status: [name] alert.      Comments: Patient follows some commands says that she is  too weak to move her arms but then is noted to move her arms without apparent difficulty.  Stated  that she could not walk or bear weight but also states that she drove herself here.  States she is wheelchair-bound but drove herself here and arrived with a cane.   Psychiatric:      Comments:  Patient seems very defensive.  When the nurse noted that she came in with a cane patient began yelling at the nurse stating that she was wheelchair-bound and could not walk.               Diagnostic Study Results        Labs -    No results found for this or any previous visit (from the past 12 hour(s)).      Radiologic Studies -    @     CT Results   (Last 48 hours)  10/30/18 0125    CT CODE NEURO HEAD WO CONTRAST  Final result            Impression:    Impression: Negative exam. Report phoned to the emergency room at 1:33 AM                                     Narrative:    CT SCAN OF THE HEAD WITHOUT IV CONTRAST:             INDICATION: CVA             COMPARISON:None             The exam is negative for hemorrhage, infarct, mass lesion, midline shift or      abnormal extra-axial fluid collection. The ventricles and cisterns are normal.       The orbits, paranasal sinuses and temporal bones are normal.                                 CXR Results   (Last 48 hours)          None                    Medical Decision Making and ED Course     I am the first provider for this patient.      I reviewed the vital signs, available nursing notes, past medical history, past surgical history, family history and social history.      Vital Signs-Reviewed the patient's vital signs.   No data found.               Provider Notes (Medical Decision Making):    58 year old female presents emergency department with generalized weakness.  Apparently reported to triage that she was weak on her left side however she reports to me that she is having bilateral arm  pain associated with arthritis and that  she is has weakness but it is been present since she had a stroke approximately a week ago.  Patient is a very difficult historian and though she does not have any overt focal abnormalities her effort is generally  lacking does seem to be more pronounced on the left than the right.  SOC did see patient and recommended general management.  He also suspects more metabolic cause.  Patient was awaiting admission when apparently she decided to leave AGAINST MEDICAL ADVICE.   Before I could get to the room to speak with her she had left.  Apparently refused to sign any paperwork.      MDM          ED Course:    Initial assessment performed. The patients presenting problems have been discussed, and they are in agreement with the care plan formulated and outlined with them.  I have encouraged them to ask questions as they arise throughout their visit.             CONSULTATIONS:   SOC, neurology, recommends admision, MRI for TIA workup      Dr. Gabriel Earing, will see for admission.                   Disposition            AMA            Diagnosis  Clinical Impression:       1.  Weakness         2.  Transient alteration of awareness            Attestations:      Burnard Hawthorne, MD      Please note that this dictation was completed with Dragon, the computer voice recognition software.  Quite often unanticipated grammatical, syntax, homophones, and other interpretive errors are inadvertently  transcribed by the computer software.  Please disregard these errors.  Please excuse any errors that have escaped final proofreading.  Thank you.

## 2018-10-30 NOTE — ED Notes (Signed)
During exam with neuro patient would not move her extremities but reports that she drove to the ED tonight, patient states that she is wheelchair bound and does not walk, brought walker to ED. Multiple statements made contradict each other yet patient is alert and oriented x 4 when neuro assessment done. Patient making comments about her care in a derogatory manner

## 2018-10-30 NOTE — Telephone Encounter (Signed)
Unfortunately when speaking with [name] on the phone, we reached a disagreement when I expressed my hesitance to increase her dosage of gabapentin and flexeril, and we became disconnected. We are hoping to see her in our office this afternoon for her scheduled 4:20 appt  Kathrin Ruddy, NP

## 2018-10-31 ENCOUNTER — Encounter: Payer: Self-pay | Admitting: Registered Nurse

## 2018-10-31 ENCOUNTER — Other Ambulatory Visit: Payer: Self-pay

## 2018-10-31 LAB — EKG 12-LEAD
Atrial Rate: 62 {beats}/min
Diagnosis: NORMAL
P Axis: 11 degrees
P-R Interval: 156 ms
Q-T Interval: 448 ms
QRS Duration: 76 ms
QTc Calculation (Bazett): 454 ms
R Axis: -18 degrees
T Axis: 36 degrees
Ventricular Rate: 62 {beats}/min

## 2018-10-31 LAB — EKG, 12 LEAD, INITIAL
Atrial Rate: 62 {beats}/min
Calculated P Axis: 11 degrees
Calculated R Axis: -18 degrees
Calculated T Axis: 36 degrees
Diagnosis: NORMAL
P-R Interval: 156 ms
Q-T Interval: 448 ms
QRS Duration: 76 ms
QTC Calculation (Bezet): 454 ms
Ventricular Rate: 62 {beats}/min

## 2018-11-03 DIAGNOSIS — J45909 Unspecified asthma, uncomplicated: Secondary | ICD-10-CM | POA: Diagnosis not present

## 2018-11-03 DIAGNOSIS — J449 Chronic obstructive pulmonary disease, unspecified: Secondary | ICD-10-CM | POA: Diagnosis not present

## 2018-11-03 DIAGNOSIS — R062 Wheezing: Secondary | ICD-10-CM | POA: Diagnosis not present

## 2018-11-03 DIAGNOSIS — M6281 Muscle weakness (generalized): Secondary | ICD-10-CM | POA: Diagnosis not present

## 2018-11-04 ENCOUNTER — Other Ambulatory Visit: Payer: Self-pay | Admitting: Registered Nurse

## 2018-11-04 DIAGNOSIS — E785 Hyperlipidemia, unspecified: Secondary | ICD-10-CM

## 2018-11-06 ENCOUNTER — Telehealth: Payer: Self-pay | Admitting: Registered Nurse

## 2018-11-06 IMAGING — DX DG CHEST 2V
2 series · 2 of 2 positions shown · non-contrast
Comparison: 08/19/2015

CLINICAL DATA: Shortness of breath, wheezing, productive cough for
1 year. History of COPD, emphysema, congestive heart failure,
hypertension.

EXAM:
CHEST  2 VIEW

[chest lat]
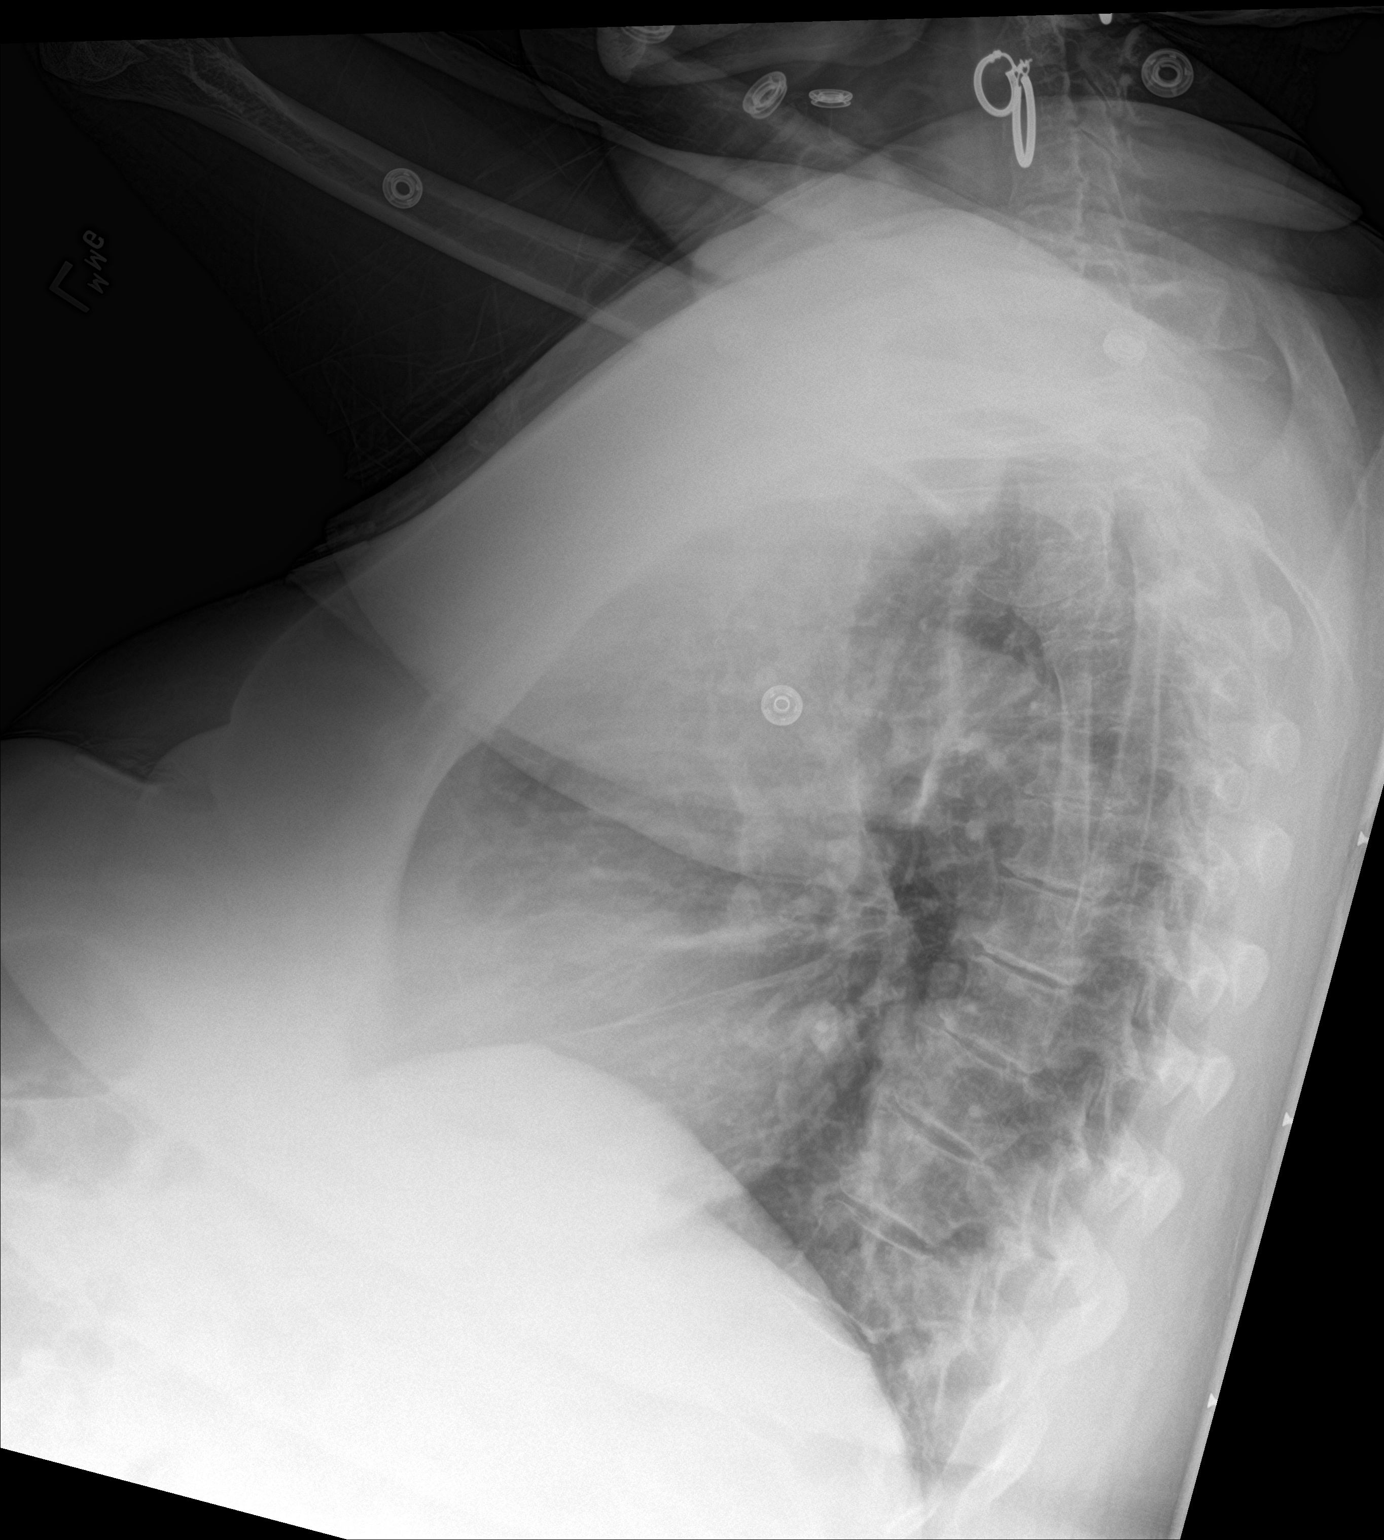

[chest ap]
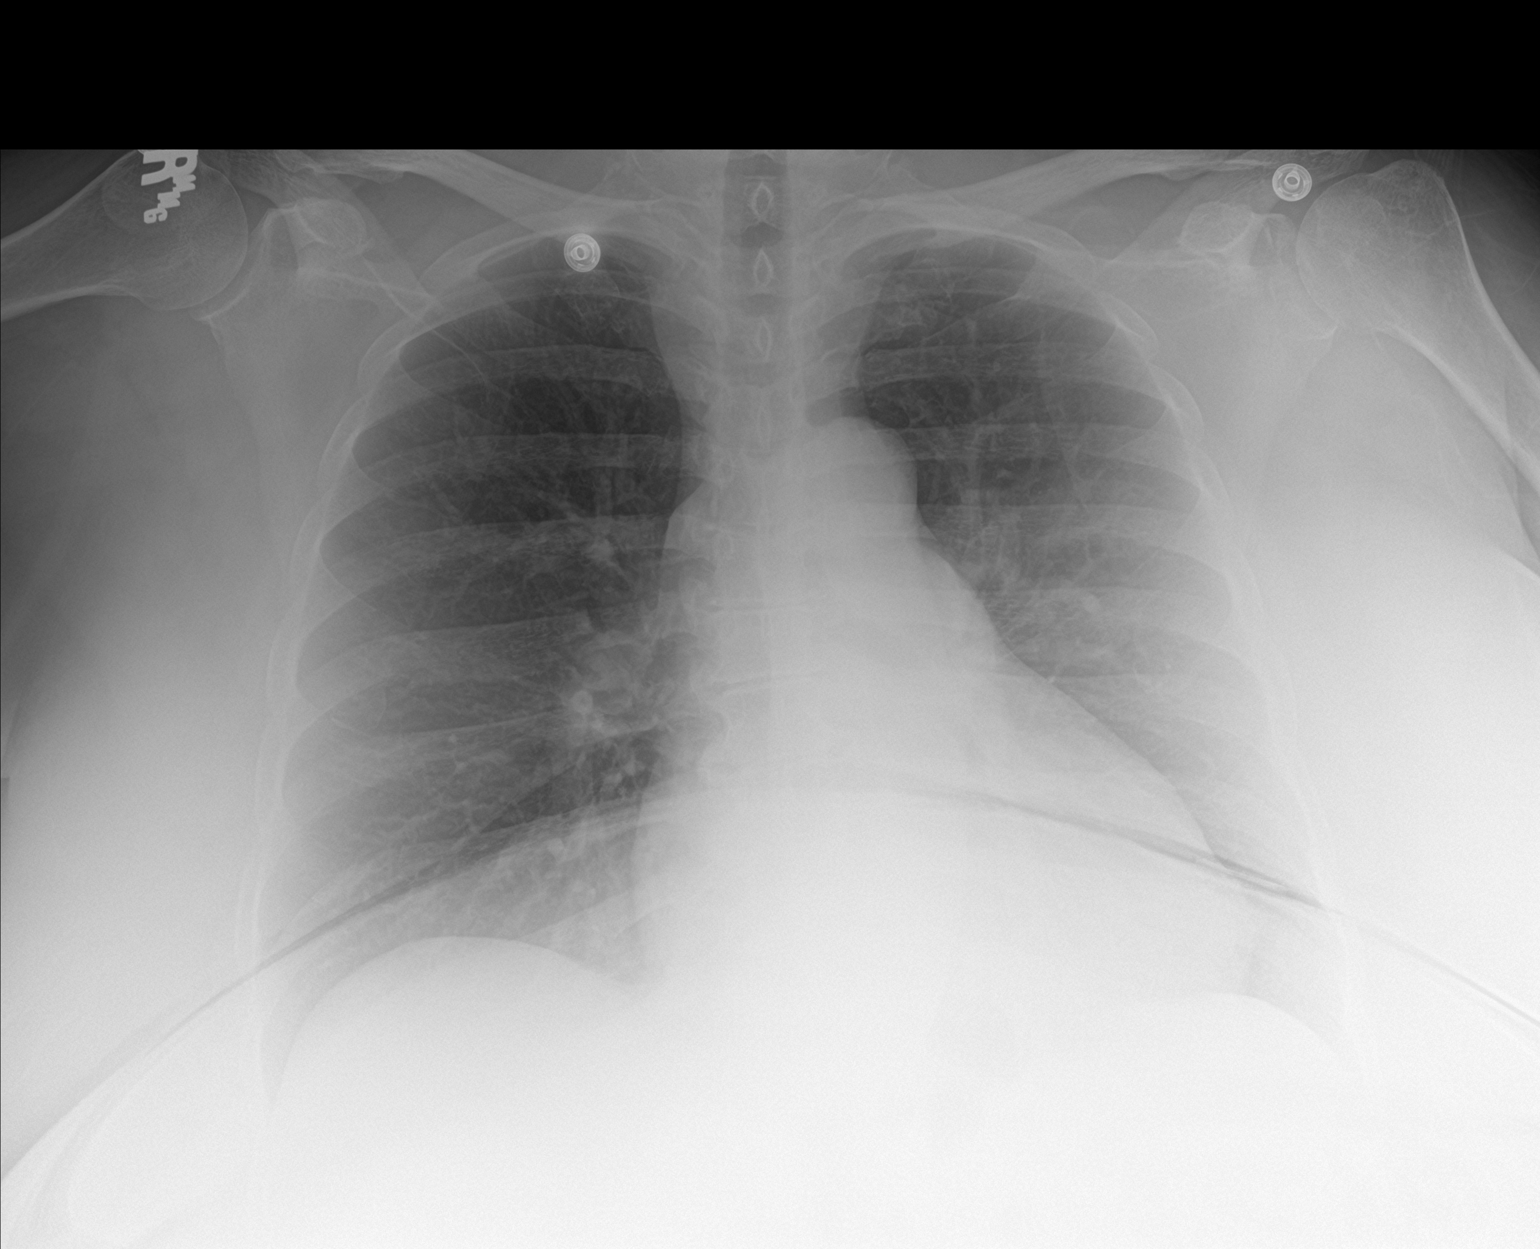

[2 of 2 positions shown; findings below may reference images not displayed]

FINDINGS: Mild cardiac enlargement without pulmonary vascular congestion or
edema. Previous right lung infiltrates have resolved. Lungs appear
clear and expanded today. No focal consolidation or airspace
disease. No blunting of costophrenic angles. No pneumothorax.
Tortuous aorta. Degenerative changes in the spine.
IMPRESSION: No active cardiopulmonary disease.

## 2018-11-06 NOTE — Telephone Encounter (Signed)
Pt is needing refill today  omeprazole (PRILOSEC) 40 MG capsule  Please call in to pharmacy on file     FR

## 2018-11-07 ENCOUNTER — Telehealth (HOSPITAL_COMMUNITY): Payer: Self-pay

## 2018-11-07 NOTE — Telephone Encounter (Signed)
Patient is calling for a refill on Vyvanse.

## 2018-11-08 ENCOUNTER — Encounter: Payer: Self-pay | Admitting: Registered Nurse

## 2018-11-08 ENCOUNTER — Other Ambulatory Visit: Payer: Self-pay

## 2018-11-08 ENCOUNTER — Ambulatory Visit (INDEPENDENT_AMBULATORY_CARE_PROVIDER_SITE_OTHER): Payer: Medicare HMO | Admitting: Registered Nurse

## 2018-11-08 ENCOUNTER — Other Ambulatory Visit: Payer: Self-pay | Admitting: Registered Nurse

## 2018-11-08 ENCOUNTER — Ambulatory Visit: Payer: Medicare HMO | Admitting: Registered Nurse

## 2018-11-08 VITALS — BP 147/76 | HR 88 | Temp 99.8°F

## 2018-11-08 DIAGNOSIS — E118 Type 2 diabetes mellitus with unspecified complications: Secondary | ICD-10-CM

## 2018-11-08 DIAGNOSIS — J9611 Chronic respiratory failure with hypoxia: Secondary | ICD-10-CM

## 2018-11-08 DIAGNOSIS — J9612 Chronic respiratory failure with hypercapnia: Secondary | ICD-10-CM | POA: Diagnosis not present

## 2018-11-08 DIAGNOSIS — D649 Anemia, unspecified: Secondary | ICD-10-CM

## 2018-11-08 DIAGNOSIS — N179 Acute kidney failure, unspecified: Secondary | ICD-10-CM | POA: Diagnosis not present

## 2018-11-08 MED ORDER — OMEPRAZOLE 40 MG PO CPDR: 40.0000 mg | DELAYED_RELEASE_CAPSULE | Freq: Two times a day (BID) | ORAL | 3 refills | Status: DC

## 2018-11-08 NOTE — Telephone Encounter (Signed)
Please advise on refill of this medication. We did not prescribe to pt previously.

## 2018-11-09 LAB — COMPREHENSIVE METABOLIC PANEL
ALT: 11 IU/L (ref 0–32)
AST: 17 IU/L (ref 0–40)
Albumin/Globulin Ratio: 1.8 (ref 1.2–2.2)
Albumin: 4 g/dL (ref 3.8–4.9)
Alkaline Phosphatase: 134 IU/L — ABNORMAL HIGH (ref 39–117)
BUN/Creatinine Ratio: 12 (ref 9–23)
BUN: 16 mg/dL (ref 6–24)
Bilirubin Total: 0.3 mg/dL (ref 0.0–1.2)
CO2: 27 mmol/L (ref 20–29)
Calcium: 9.5 mg/dL (ref 8.7–10.2)
Chloride: 106 mmol/L (ref 96–106)
Creatinine, Ser: 1.39 mg/dL — ABNORMAL HIGH (ref 0.57–1.00)
GFR calc Af Amer: 48 mL/min/{1.73_m2} — ABNORMAL LOW (ref >59)
GFR calc non Af Amer: 42 mL/min/{1.73_m2} — ABNORMAL LOW (ref >59)
Globulin, Total: 2.2 g/dL (ref 1.5–4.5)
Glucose: 92 mg/dL (ref 65–99)
Potassium: 4.1 mmol/L (ref 3.5–5.2)
Sodium: 143 mmol/L (ref 134–144)
Total Protein: 6.2 g/dL (ref 6.0–8.5)

## 2018-11-09 LAB — CBC WITH DIFFERENTIAL/PLATELET
Basophils Absolute: 0 10*3/uL (ref 0.0–0.2)
Basos: 0 %
EOS (ABSOLUTE): 0.2 10*3/uL (ref 0.0–0.4)
Eos: 3 %
Hematocrit: 25.7 % — ABNORMAL LOW (ref 34.0–46.6)
Hemoglobin: 8.4 g/dL — ABNORMAL LOW (ref 11.1–15.9)
Immature Grans (Abs): 0 10*3/uL (ref 0.0–0.1)
Immature Granulocytes: 0 %
Lymphocytes Absolute: 2.1 10*3/uL (ref 0.7–3.1)
Lymphs: 32 %
MCH: 28.6 pg (ref 26.6–33.0)
MCHC: 32.7 g/dL (ref 31.5–35.7)
MCV: 87 fL (ref 79–97)
Monocytes Absolute: 0.4 10*3/uL (ref 0.1–0.9)
Monocytes: 6 %
Neutrophils Absolute: 4 10*3/uL (ref 1.4–7.0)
Neutrophils: 59 %
Platelets: 311 10*3/uL (ref 150–450)
RBC: 2.94 x10E6/uL — ABNORMAL LOW (ref 3.77–5.28)
RDW: 14.4 % (ref 11.7–15.4)
WBC: 6.7 10*3/uL (ref 3.4–10.8)

## 2018-11-09 LAB — HEMOGLOBIN A1C
Est. average glucose Bld gHb Est-mCnc: 114 mg/dL
Hgb A1c MFr Bld: 5.6 % (ref 4.8–5.6)

## 2018-11-12 ENCOUNTER — Telehealth: Payer: Self-pay | Admitting: Registered Nurse

## 2018-11-12 ENCOUNTER — Encounter: Payer: Self-pay | Admitting: Registered Nurse

## 2018-11-12 NOTE — Telephone Encounter (Signed)
Please see Dr. Mamie Nick note from October 13.  Patient was admitted in the hospital and Vyvanse was discontinued.  She should not be on Vyvanse.

## 2018-11-12 NOTE — Telephone Encounter (Signed)
Pt requesting a cb from [phone] in regards to something that happened to her when she relocated her living situation

## 2018-11-12 NOTE — Telephone Encounter (Signed)
Odin patient calling about Vyvanse refill.Marland KitchenMarland KitchenMarland Kitchen

## 2018-11-12 NOTE — Telephone Encounter (Signed)
Patient is calling for a refill on Vyvanse.Tiffany KitchenMarland [name]
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
## 2018-11-13 ENCOUNTER — Other Ambulatory Visit: Payer: Self-pay

## 2018-11-13 ENCOUNTER — Encounter (HOSPITAL_COMMUNITY): Payer: Self-pay | Admitting: Psychiatry

## 2018-11-13 ENCOUNTER — Ambulatory Visit (INDEPENDENT_AMBULATORY_CARE_PROVIDER_SITE_OTHER): Payer: Medicare HMO | Admitting: Psychiatry

## 2018-11-13 ENCOUNTER — Other Ambulatory Visit: Payer: Medicare HMO | Admitting: Internal Medicine

## 2018-11-13 DIAGNOSIS — F250 Schizoaffective disorder, bipolar type: Secondary | ICD-10-CM | POA: Diagnosis not present

## 2018-11-13 DIAGNOSIS — F200 Paranoid schizophrenia: Secondary | ICD-10-CM | POA: Diagnosis not present

## 2018-11-13 NOTE — Telephone Encounter (Signed)
Pt called in this morning requesting phone call from [place]. She stated that she slept outside last night and needs a phone call soon. Urgent

## 2018-11-13 NOTE — Telephone Encounter (Signed)
BERLINE SEMRAD is trying to reach the provider.
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
## 2018-11-14 DIAGNOSIS — I2699 Other pulmonary embolism without acute cor pulmonale: Secondary | ICD-10-CM | POA: Diagnosis not present

## 2018-11-14 DIAGNOSIS — R062 Wheezing: Secondary | ICD-10-CM | POA: Diagnosis not present

## 2018-11-14 DIAGNOSIS — E119 Type 2 diabetes mellitus without complications: Secondary | ICD-10-CM | POA: Diagnosis not present

## 2018-11-14 DIAGNOSIS — F209 Schizophrenia, unspecified: Secondary | ICD-10-CM | POA: Diagnosis not present

## 2018-11-14 DIAGNOSIS — Z760 Encounter for issue of repeat prescription: Secondary | ICD-10-CM | POA: Diagnosis not present

## 2018-11-14 DIAGNOSIS — R079 Chest pain, unspecified: Secondary | ICD-10-CM | POA: Diagnosis not present

## 2018-11-14 DIAGNOSIS — I10 Essential (primary) hypertension: Secondary | ICD-10-CM | POA: Diagnosis not present

## 2018-11-14 DIAGNOSIS — R0602 Shortness of breath: Secondary | ICD-10-CM | POA: Diagnosis not present

## 2018-11-14 DIAGNOSIS — M6281 Muscle weakness (generalized): Secondary | ICD-10-CM | POA: Diagnosis not present

## 2018-11-14 DIAGNOSIS — J449 Chronic obstructive pulmonary disease, unspecified: Secondary | ICD-10-CM | POA: Diagnosis not present

## 2018-11-14 DIAGNOSIS — J45909 Unspecified asthma, uncomplicated: Secondary | ICD-10-CM | POA: Diagnosis not present

## 2018-11-14 NOTE — Telephone Encounter (Signed)
Pt called this morning to complain about lack of communication and to inform the office that she is currently in the hospital and needs her medication

## 2018-11-15 ENCOUNTER — Other Ambulatory Visit (HOSPITAL_COMMUNITY): Payer: Self-pay | Admitting: Psychiatry

## 2018-11-15 ENCOUNTER — Other Ambulatory Visit: Payer: Self-pay | Admitting: Registered Nurse

## 2018-11-15 ENCOUNTER — Telehealth (HOSPITAL_COMMUNITY): Payer: Self-pay

## 2018-11-15 DIAGNOSIS — I110 Hypertensive heart disease with heart failure: Secondary | ICD-10-CM | POA: Diagnosis not present

## 2018-11-15 DIAGNOSIS — F259 Schizoaffective disorder, unspecified: Secondary | ICD-10-CM | POA: Diagnosis not present

## 2018-11-15 DIAGNOSIS — Z20828 Contact with and (suspected) exposure to other viral communicable diseases: Secondary | ICD-10-CM | POA: Diagnosis not present

## 2018-11-15 DIAGNOSIS — J42 Unspecified chronic bronchitis: Secondary | ICD-10-CM

## 2018-11-15 DIAGNOSIS — N3281 Overactive bladder: Secondary | ICD-10-CM

## 2018-11-15 DIAGNOSIS — I5032 Chronic diastolic (congestive) heart failure: Secondary | ICD-10-CM

## 2018-11-15 DIAGNOSIS — I10 Essential (primary) hypertension: Secondary | ICD-10-CM | POA: Diagnosis not present

## 2018-11-15 DIAGNOSIS — E1142 Type 2 diabetes mellitus with diabetic polyneuropathy: Secondary | ICD-10-CM

## 2018-11-15 DIAGNOSIS — E785 Hyperlipidemia, unspecified: Secondary | ICD-10-CM

## 2018-11-15 DIAGNOSIS — I252 Old myocardial infarction: Secondary | ICD-10-CM | POA: Diagnosis not present

## 2018-11-15 DIAGNOSIS — F419 Anxiety disorder, unspecified: Secondary | ICD-10-CM | POA: Diagnosis not present

## 2018-11-15 DIAGNOSIS — F319 Bipolar disorder, unspecified: Secondary | ICD-10-CM | POA: Diagnosis not present

## 2018-11-15 DIAGNOSIS — I509 Heart failure, unspecified: Secondary | ICD-10-CM | POA: Diagnosis not present

## 2018-11-15 DIAGNOSIS — K625 Hemorrhage of anus and rectum: Secondary | ICD-10-CM

## 2018-11-15 DIAGNOSIS — E119 Type 2 diabetes mellitus without complications: Secondary | ICD-10-CM | POA: Diagnosis not present

## 2018-11-15 MED ORDER — PRAZOSIN HCL 1 MG PO CAPS: 1.0000 mg | ORAL_CAPSULE | Freq: Every day | ORAL | 2 refills | Status: DC

## 2018-11-15 MED ORDER — DOXAZOSIN MESYLATE 2 MG PO TABS: 2.0000 mg | ORAL_TABLET | Freq: Every day | ORAL | 0 refills | Status: DC

## 2018-11-15 MED ORDER — ATORVASTATIN CALCIUM 80 MG PO TABS: 80.0000 mg | ORAL_TABLET | Freq: Every day | ORAL | 0 refills | Status: DC

## 2018-11-15 MED ORDER — TORSEMIDE 20 MG PO TABS: 80.0000 mg | ORAL_TABLET | Freq: Two times a day (BID) | ORAL | 4 refills | Status: DC

## 2018-11-15 MED ORDER — DOCUSATE SODIUM 100 MG PO CAPS: 100.0000 mg | ORAL_CAPSULE | Freq: Every day | ORAL | 2 refills | Status: DC | PRN

## 2018-11-15 MED ORDER — ASENAPINE MALEATE 10 MG SL SUBL: 10.0000 mg | SUBLINGUAL_TABLET | Freq: Two times a day (BID) | SUBLINGUAL | 1 refills | Status: DC

## 2018-11-15 MED ORDER — TRAZODONE HCL 100 MG PO TABS: 100.0000 mg | ORAL_TABLET | Freq: Every day | ORAL | 0 refills | Status: DC

## 2018-11-15 MED ORDER — DICLOFENAC SODIUM 1 % TD GEL: 2.0000 g | Freq: Four times a day (QID) | TRANSDERMAL | 0 refills | Status: DC

## 2018-11-15 MED ORDER — DIAZEPAM 5 MG PO TABS: 5.0000 mg | ORAL_TABLET | Freq: Every day | ORAL | 0 refills | Status: DC

## 2018-11-15 MED ORDER — AMLODIPINE BESYLATE 5 MG PO TABS: ORAL_TABLET | ORAL | 3 refills | Status: DC

## 2018-11-15 MED ORDER — OXYBUTYNIN CHLORIDE ER 10 MG PO TB24: 10.0000 mg | ORAL_TABLET | Freq: Every day | ORAL | 1 refills | Status: DC

## 2018-11-15 MED ORDER — ALBUTEROL SULFATE HFA 108 (90 BASE) MCG/ACT IN AERS: 2.0000 | INHALATION_SPRAY | Freq: Four times a day (QID) | RESPIRATORY_TRACT | 2 refills | Status: DC | PRN

## 2018-11-15 MED ORDER — BUDESONIDE-FORMOTEROL FUMARATE 80-4.5 MCG/ACT IN AERO: 2.0000 | INHALATION_SPRAY | Freq: Two times a day (BID) | RESPIRATORY_TRACT | 2 refills | Status: DC

## 2018-11-15 MED ORDER — MONTELUKAST SODIUM 10 MG PO TABS: ORAL_TABLET | ORAL | 0 refills | Status: DC

## 2018-11-15 MED ORDER — GABAPENTIN 600 MG PO TABS: 600.0000 mg | ORAL_TABLET | Freq: Two times a day (BID) | ORAL | 1 refills | Status: DC

## 2018-11-15 NOTE — Telephone Encounter (Signed)
Patient called and stated that she's in the hospital and that they're not treating her well. She also stated that she's not getting her medications and would like you to call her at [phone]. Thank you.

## 2018-11-18 ENCOUNTER — Other Ambulatory Visit: Payer: Self-pay

## 2018-11-18 ENCOUNTER — Ambulatory Visit (INDEPENDENT_AMBULATORY_CARE_PROVIDER_SITE_OTHER): Payer: Medicare HMO | Admitting: Registered Nurse

## 2018-11-18 ENCOUNTER — Encounter: Payer: Self-pay | Admitting: Registered Nurse

## 2018-11-18 VITALS — BP 150/90 | HR 90 | Temp 98.0°F | Resp 16

## 2018-11-18 DIAGNOSIS — Z79899 Other long term (current) drug therapy: Secondary | ICD-10-CM | POA: Diagnosis not present
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
## 2018-11-18 NOTE — Patient Instructions (Signed)
° ° ° °  If you have lab work done today you will be contacted with your lab results within the next 2 weeks.  If you have not heard from us then please contact us. The fastest way to get your results is to register for My Chart. ° ° °IF you received an x-ray today, you will receive an invoice from [facility]. Please contact  Radiology at [phone] with questions or concerns regarding your invoice.  ° °IF you received labwork today, you will receive an invoice from [facility]. Please contact [facility] at [phone] with questions or concerns regarding your invoice.  ° °Our billing staff will not be able to assist you with questions regarding bills from these companies. ° °You will be contacted with the lab results as soon as they are available. The fastest way to get your results is to activate your My Chart account. Instructions are located on the last page of this paperwork. If you have not heard from us regarding the results in 2 weeks, please contact this office. °  ° ° ° °

## 2018-11-19 ENCOUNTER — Ambulatory Visit (HOSPITAL_COMMUNITY): Payer: Medicare HMO | Admitting: Psychiatry

## 2018-11-20 ENCOUNTER — Telehealth: Payer: Self-pay | Admitting: Registered Nurse

## 2018-11-20 NOTE — Telephone Encounter (Signed)
Requesting refill   ferrous sulfate 325 (65 FE) MG tablet [[id number]] colchicine (COLCRYS) 0.6 MG tablet [[id number]]   tacrolimus (PROTOPIC) 0.03 % ointment [[id number]]   zinc oxide 20 % ointment [[id number]]    Please advise

## 2018-11-21 ENCOUNTER — Other Ambulatory Visit: Payer: Self-pay | Admitting: Registered Nurse

## 2018-11-21 ENCOUNTER — Ambulatory Visit (INDEPENDENT_AMBULATORY_CARE_PROVIDER_SITE_OTHER): Payer: Medicare HMO | Admitting: Psychiatry

## 2018-11-21 ENCOUNTER — Other Ambulatory Visit: Payer: Self-pay

## 2018-11-21 ENCOUNTER — Other Ambulatory Visit: Payer: Self-pay | Admitting: *Deleted

## 2018-11-21 DIAGNOSIS — F250 Schizoaffective disorder, bipolar type: Secondary | ICD-10-CM

## 2018-11-21 DIAGNOSIS — Z87891 Personal history of nicotine dependence: Secondary | ICD-10-CM

## 2018-11-21 DIAGNOSIS — F4312 Post-traumatic stress disorder, chronic: Secondary | ICD-10-CM | POA: Diagnosis not present

## 2018-11-21 DIAGNOSIS — J42 Unspecified chronic bronchitis: Secondary | ICD-10-CM

## 2018-11-21 DIAGNOSIS — Z122 Encounter for screening for malignant neoplasm of respiratory organs: Secondary | ICD-10-CM

## 2018-11-21 DIAGNOSIS — L299 Pruritus, unspecified: Secondary | ICD-10-CM

## 2018-11-21 MED ORDER — ESCITALOPRAM OXALATE 20 MG PO TABS: 20.0000 mg | ORAL_TABLET | Freq: Every day | ORAL | 0 refills | Status: DC

## 2018-11-21 MED ORDER — ZINC OXIDE 20 % EX OINT: 1.0000 "application " | TOPICAL_OINTMENT | CUTANEOUS | 5 refills | Status: DC | PRN

## 2018-11-21 MED ORDER — LISDEXAMFETAMINE DIMESYLATE 70 MG PO CAPS: 70.0000 mg | ORAL_CAPSULE | Freq: Every day | ORAL | 0 refills | Status: DC

## 2018-11-21 MED ORDER — DIAZEPAM 10 MG PO TABS: 10.0000 mg | ORAL_TABLET | Freq: Every day | ORAL | 2 refills | Status: DC

## 2018-11-21 MED ORDER — TRELEGY ELLIPTA 100-62.5-25 MCG/INH IN AEPB: 1.0000 | INHALATION_SPRAY | Freq: Every day | RESPIRATORY_TRACT | 1 refills | Status: DC

## 2018-11-21 MED ORDER — TACROLIMUS 0.03 % EX OINT: TOPICAL_OINTMENT | Freq: Two times a day (BID) | CUTANEOUS | 0 refills | Status: DC

## 2018-11-21 NOTE — Telephone Encounter (Signed)
[facility] fills to [facility] [address]  Thank you  Kathrin Ruddy, NP
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
## 2018-11-21 NOTE — Progress Notes (Signed)
Chest  

## 2018-11-22 ENCOUNTER — Other Ambulatory Visit (HOSPITAL_COMMUNITY): Payer: Self-pay | Admitting: Psychiatry

## 2018-11-22 ENCOUNTER — Telehealth (HOSPITAL_COMMUNITY): Payer: Self-pay

## 2018-11-22 MED ORDER — DIAZEPAM 10 MG PO TABS: 10.0000 mg | ORAL_TABLET | Freq: Two times a day (BID) | ORAL | 2 refills | Status: DC

## 2018-11-22 NOTE — Telephone Encounter (Signed)
Patient called and said that her medications are not right. She said the Xanax should be 3 times a day, there should be another trazodone 50 mg for the morning, and the Lexapro should be 40 mg. Please review and advise, thank you

## 2018-11-22 NOTE — Telephone Encounter (Signed)
I renewed medications how they were on her discharge from geropsychiatry in September (except that I restarted Vyvanse). In the past she was on valium tid and on 40 mg of Lexapro. These are high doses and for Lexapro it is also risky in terms of possibly causing heart arrhythmia. She indeed was taking trazodone 50 mg in am prior to that hospitalization for anxiety but I see no reason for taking both  Valium and trazodone during the day. At this time I am willing to increase Valium to bid but not Lexapro to 40 mg.

## 2018-11-24 DIAGNOSIS — J449 Chronic obstructive pulmonary disease, unspecified: Secondary | ICD-10-CM | POA: Diagnosis not present

## 2018-11-25 ENCOUNTER — Ambulatory Visit (HOSPITAL_COMMUNITY): Payer: Medicare HMO | Admitting: Psychiatry

## 2018-11-25 ENCOUNTER — Telehealth: Payer: Self-pay | Admitting: Registered Nurse

## 2018-11-25 ENCOUNTER — Other Ambulatory Visit: Payer: Self-pay | Admitting: Registered Nurse

## 2018-11-25 ENCOUNTER — Other Ambulatory Visit (HOSPITAL_COMMUNITY): Payer: Self-pay

## 2018-11-25 DIAGNOSIS — R0789 Other chest pain: Secondary | ICD-10-CM

## 2018-11-25 MED ORDER — TRAZODONE HCL 50 MG PO TABS: 50.0000 mg | ORAL_TABLET | Freq: Every day | ORAL | 0 refills | Status: DC

## 2018-11-25 NOTE — Telephone Encounter (Signed)
Pt requesting muscle relaxer and potassium and colcrys. Pt states she expects prescription to be filled today.

## 2018-11-26 NOTE — Telephone Encounter (Signed)
° °  Pt is following up on her req

## 2018-11-26 NOTE — Telephone Encounter (Signed)
Requested medication (s) are due for refill today: yes  Requested medication (s) are on the active medication list: yes  Last refill: 10/25/2018  Future visit scheduled: yes  Notes to clinic:  Refill cannot be delegated    Requested Prescriptions  Pending Prescriptions Disp Refills   cyclobenzaprine (FLEXERIL) 5 MG tablet [Pharmacy Med Name: CYCLOBENZAPRINE 5 MG TABLET] 60 tablet 0    Sig: TAKE 1 TABLET BY MOUTH THREE TIMES A DAY AS NEEDED FOR MUSCLE SPASMS     Not Delegated - Analgesics:  Muscle Relaxants Failed - 11/25/2018  8:53 PM      Failed - This refill cannot be delegated      Passed - Valid encounter within last 6 months    Recent Outpatient Visits          1 week ago Medication management   [facility], [place], NP   2 weeks ago Type 2 diabetes mellitus with complication, without long-term current use of insulin ([place])   [facility], [place], NP   1 month ago Left-sided chest wall pain   [facility], [place], NP   2 months ago Left-sided chest wall pain   [facility], Delfino Lovett, NP   3 months ago Overactive bladder   [facility], Delfino Lovett, NP      Future Appointments            In 1 week Maximiano Coss, NP Primary Care at [place], [facility]   In 1 month Byrum, Rose Fillers, MD [facility]

## 2018-11-27 ENCOUNTER — Encounter: Payer: Self-pay | Admitting: Registered Nurse

## 2018-11-27 ENCOUNTER — Other Ambulatory Visit: Payer: Self-pay

## 2018-11-27 ENCOUNTER — Encounter (HOSPITAL_COMMUNITY): Payer: Self-pay | Admitting: Psychiatry

## 2018-11-27 ENCOUNTER — Ambulatory Visit (INDEPENDENT_AMBULATORY_CARE_PROVIDER_SITE_OTHER): Payer: Medicare HMO | Admitting: Psychiatry

## 2018-11-27 DIAGNOSIS — F4312 Post-traumatic stress disorder, chronic: Secondary | ICD-10-CM

## 2018-11-27 DIAGNOSIS — F250 Schizoaffective disorder, bipolar type: Secondary | ICD-10-CM

## 2018-11-27 NOTE — Progress Notes (Signed)
Virtual Visit via Telephone Note  I connected with SHAREESE MACHA on 11/27/18 at  2:00 PM EST by telephone and verified that I am speaking with the correct person using two identifiers.  Location: Patient: Tiffany [name] Provider: Lise Auer, LCSW   I discussed the limitations, risks, security and privacy concerns of performing an evaluation and management service by telephone and the availability of in person appointments. I also discussed with the patient that there may be a patient responsible charge related to this service. The patient expressed understanding and agreed to proceed.   History of Present Illness: Schizoaffective Disorder, Bipolar Type and PTSD   Observations/Objective: Counselor met with [name] for individual therapy via Webex. Counselor assessed MH symptoms and progress on treatment plan goals. [name] presents with high anxiety, severe depression and paranoia. [name] denied suicidal ideation or self-harm behaviors. [name] shared that she is now residing with her mother and feels more stable than when experiencing homelessness/unpredictable housing. Counselor assessed safety and daily functioning. [name] identified that her PTSD symptoms are overwhelming and is in need of more intensive trauma based therapy. Counselor shared psychoeducation about the therapeutic process, focusing on stability to be able to process truama, grief and loss more intensely. Counselor and [name] explored services she needs in place to become more stable. [name] explored trauma triggers and Counselor offered potential coping strategies for her to implement in reducing PTSD symptoms. Counselor to send additional resources to connect [place] with needed services.   Assessment and Plan: Counselor will continue to meet with patient to address treatment plan goals. Patient will continue to follow recommendations of providers and implement skills learned in session.  Follow Up Instructions: Counselor will send  information for next session via Webex.     I discussed the assessment and treatment plan with the patient. The patient was provided an opportunity to ask questions and all were answered. The patient agreed with the plan and demonstrated an understanding of the instructions.   The patient was advised to call back or seek an in-person evaluation if the symptoms worsen or if the condition fails to improve as anticipated.  I provided 55 minutes of non-face-to-face time during this encounter.   Lise Auer, LCSW

## 2018-11-28 ENCOUNTER — Telehealth (HOSPITAL_COMMUNITY): Payer: Self-pay

## 2018-11-28 NOTE — Telephone Encounter (Signed)
Patient would like you to call her, she said she is having a really hard time and wants to discuss her medication. She asked me of there is a medication for her PTSD and I explained that she really just needs to attend therapy and learn coping skills.

## 2018-11-29 ENCOUNTER — Telehealth: Payer: Self-pay

## 2018-11-29 NOTE — Telephone Encounter (Signed)
Pt asking for the nurse to give her a call

## 2018-11-29 NOTE — Telephone Encounter (Signed)
Received phone call from [place] at [facility] to follow up regarding referral sent to Palliative Care. Tiffany [name] made aware that patient was seen on 09/03/2018 after receiving this referral

## 2018-11-29 NOTE — Telephone Encounter (Signed)
Pt requesting muscle relaxer and potassium and colcrys.  She has had the request in for a while she no longer can bare the pain

## 2018-11-30 DIAGNOSIS — J449 Chronic obstructive pulmonary disease, unspecified: Secondary | ICD-10-CM | POA: Diagnosis not present

## 2018-11-30 DIAGNOSIS — J9611 Chronic respiratory failure with hypoxia: Secondary | ICD-10-CM | POA: Diagnosis not present

## 2018-11-30 DIAGNOSIS — J9612 Chronic respiratory failure with hypercapnia: Secondary | ICD-10-CM | POA: Diagnosis not present

## 2018-12-02 ENCOUNTER — Inpatient Hospital Stay: Admission: RE | Admit: 2018-12-02 | Payer: Medicare HMO | Source: Ambulatory Visit

## 2018-12-02 ENCOUNTER — Encounter: Payer: Medicare HMO | Admitting: Acute Care

## 2018-12-02 ENCOUNTER — Telehealth: Payer: Self-pay | Admitting: Acute Care

## 2018-12-02 NOTE — Telephone Encounter (Signed)
Called to speak with pt.  She stated she was sleeping and asked that I call her back later.  Will close this message and refer to referral notes.

## 2018-12-03 IMAGING — US US ABDOMEN LIMITED
1 series · 14 of 25 positions shown · non-contrast
Comparison: Abdominal radiograph dated 04/10/2016

CLINICAL DATA: 55-year-old female with right upper quadrant
abdominal pain.

EXAM:
US ABDOMEN LIMITED - RIGHT UPPER QUADRANT

[Series 1: us abdomen limited · 0.30mm/px · 14 of 52 slices shown]
[im 1/52]
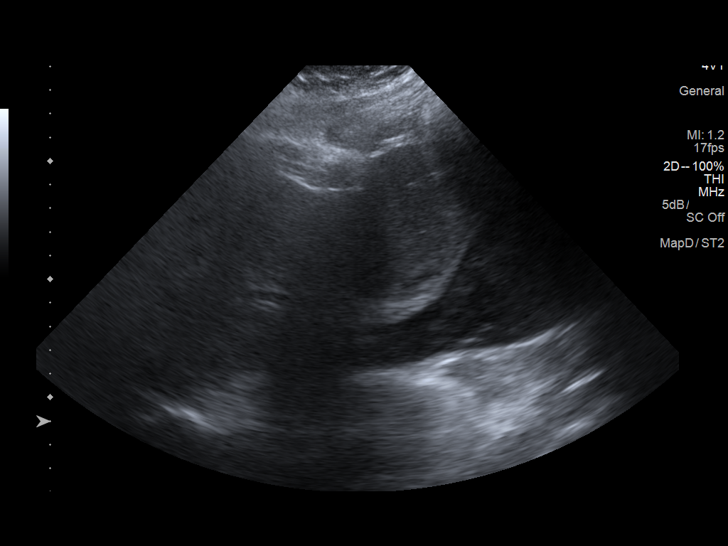
[im 5/52]
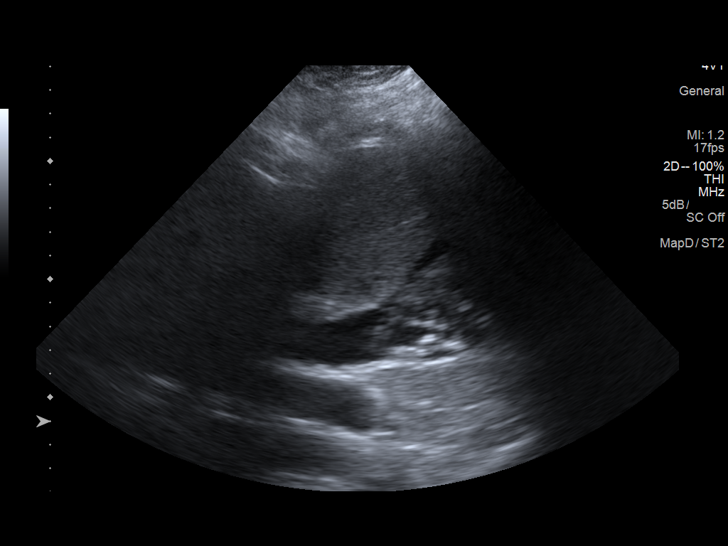
[im 9/52]
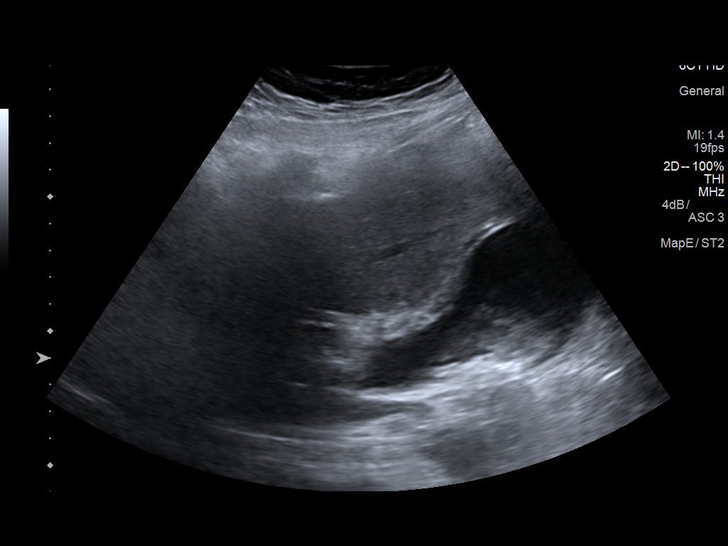
[im 13/52]
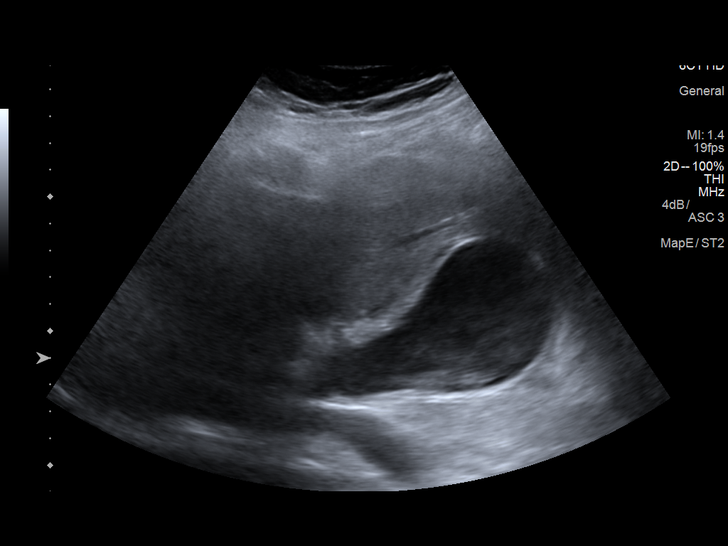
[im 18/52]
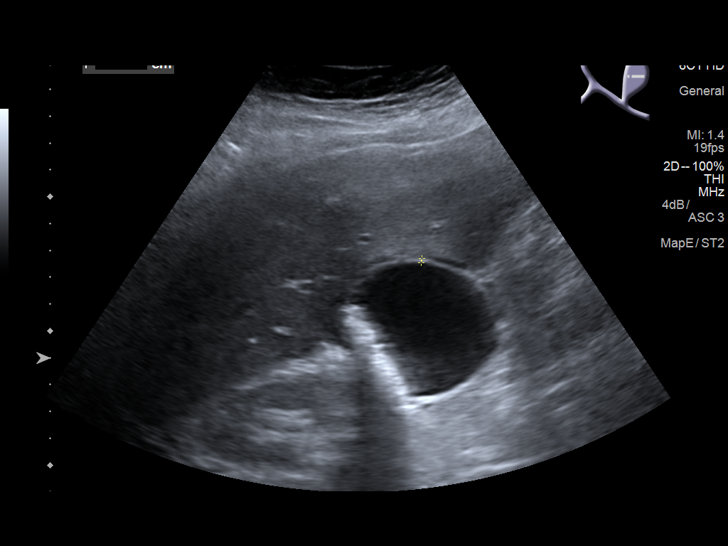
[im 20/52]
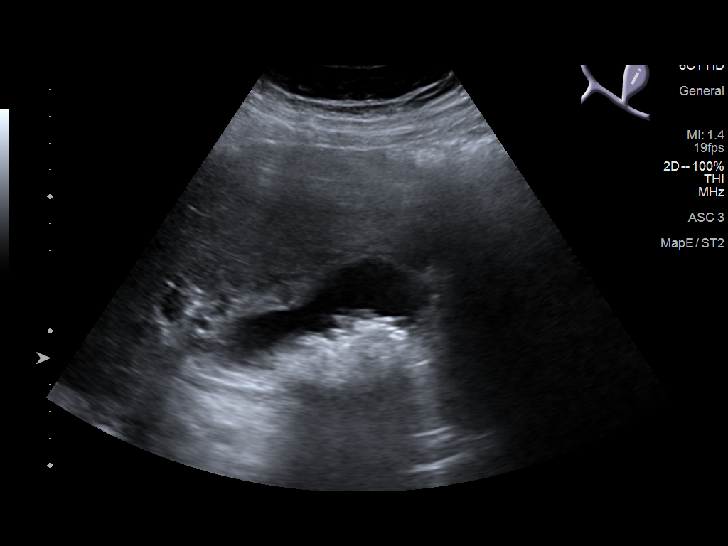
[im 24/52]
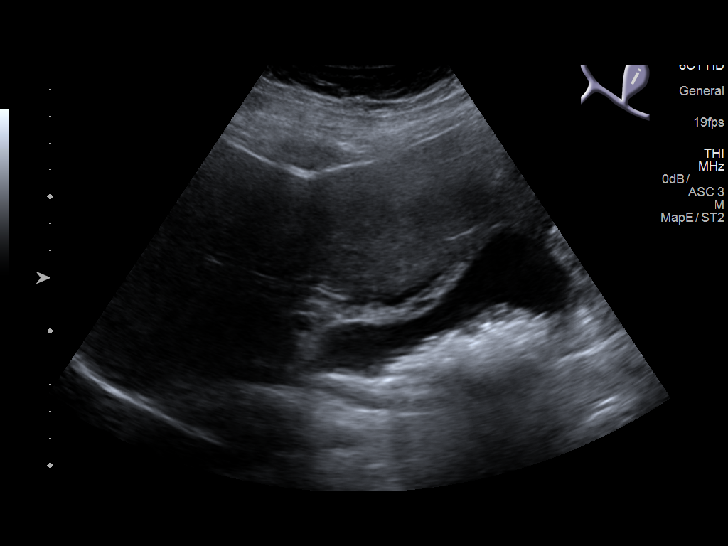
[im 28/52]
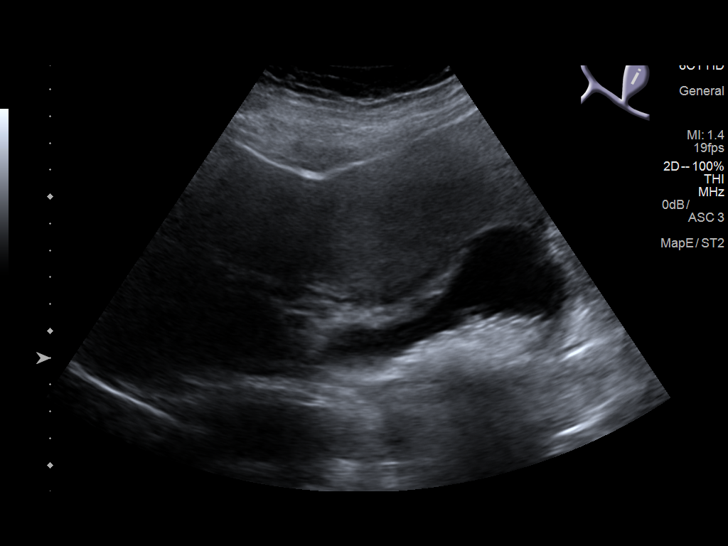
[im 32/52]
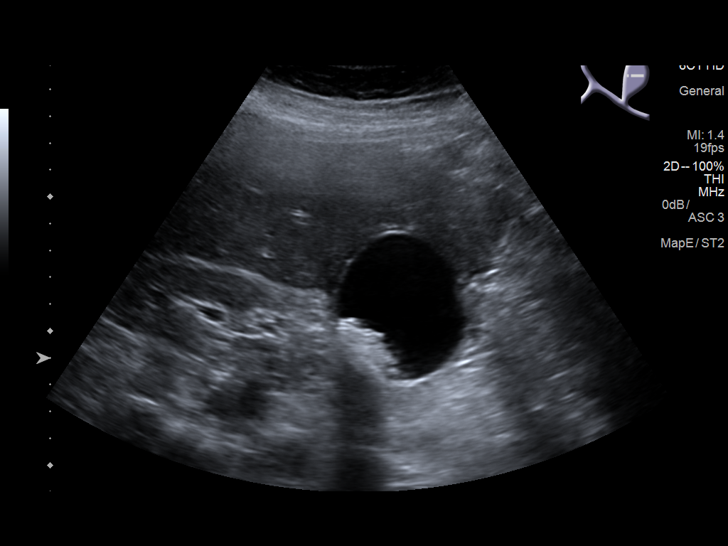
[im 35/52]
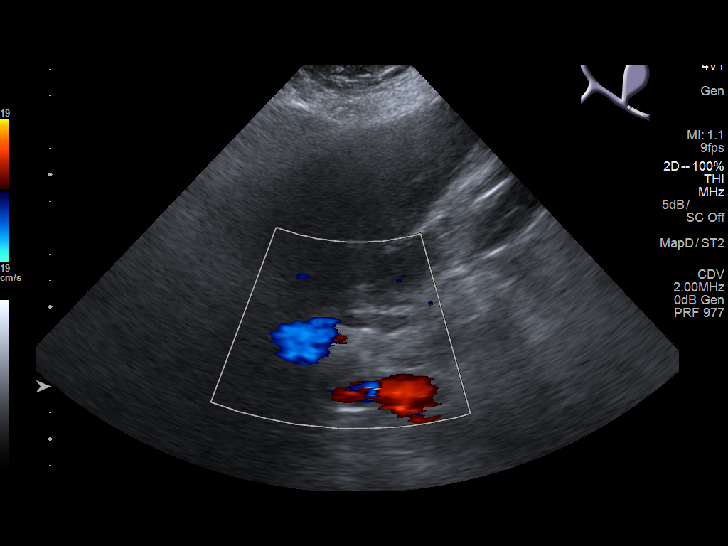
[im 39/52]
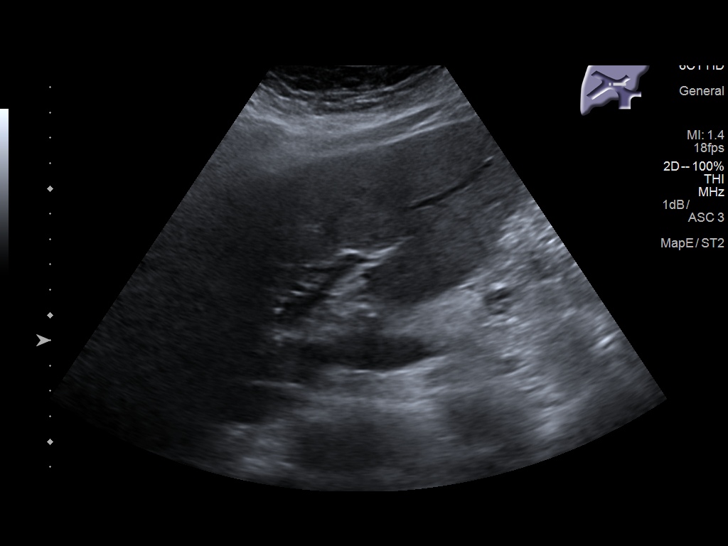
[im 43/52]
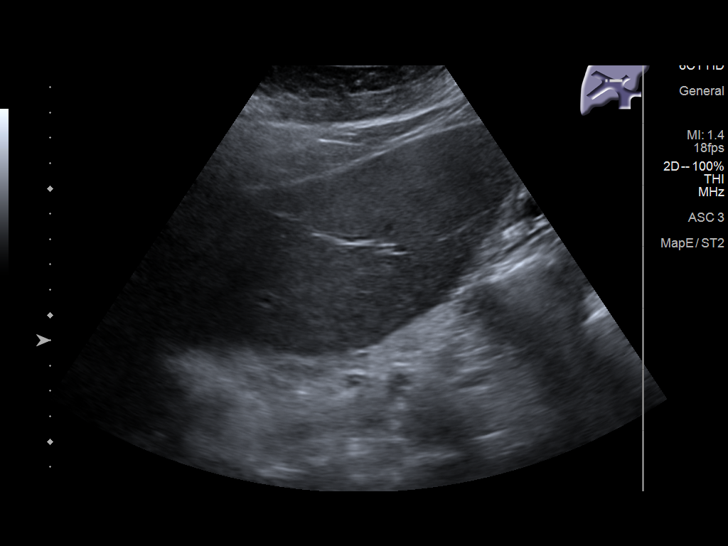
[im 47/52]
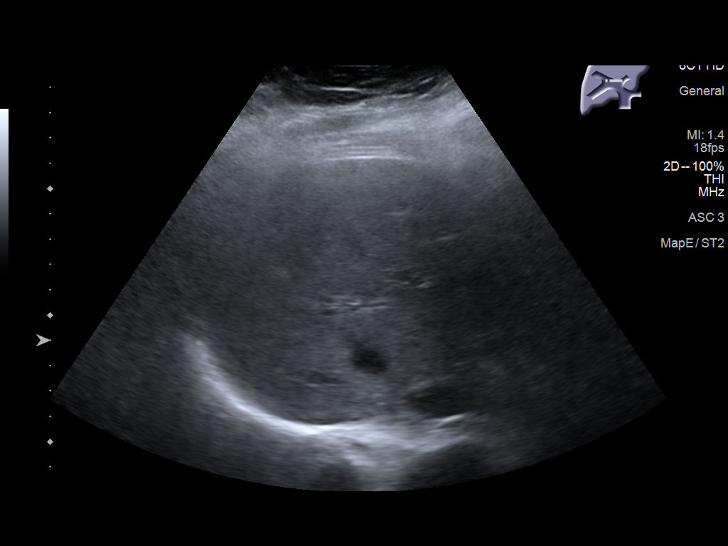
[im 52/52]
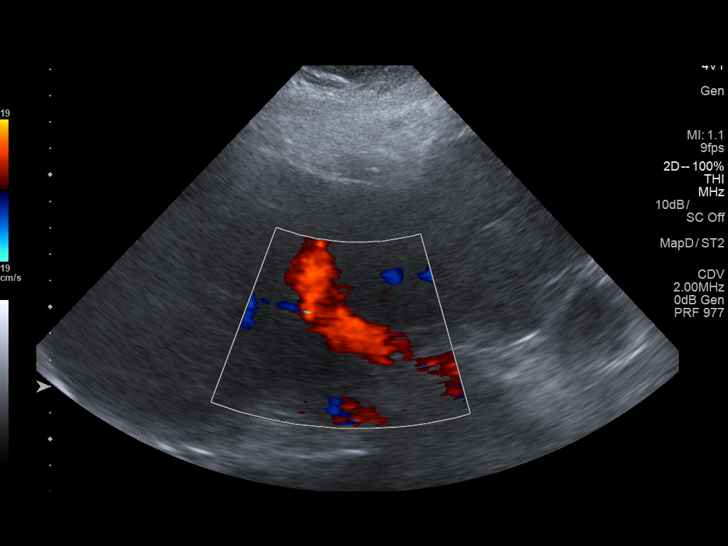

[14 of 25 positions shown; findings below may reference images not displayed]

FINDINGS: Gallbladder:

There multiple stones within the gallbladder. The gallbladder wall
is thickened and edematous measuring 7 mm in thickness. There is
trace pericholecystic fluid. Tenderness was elicited over the
gallbladder area during scanning.

Common bile duct:

Diameter: 7 mm.

Liver:

No focal lesion identified. Within normal limits in parenchymal
echogenicity.
IMPRESSION: Cholelithiasis with findings suspicious for acute cholecystitis. A
hepatobiliary scintigraphy may provide better evaluation of the
gallbladder if clinically indicated.

## 2018-12-04 ENCOUNTER — Other Ambulatory Visit: Payer: Self-pay | Admitting: Registered Nurse

## 2018-12-04 DIAGNOSIS — M6281 Muscle weakness (generalized): Secondary | ICD-10-CM | POA: Diagnosis not present

## 2018-12-04 DIAGNOSIS — J45909 Unspecified asthma, uncomplicated: Secondary | ICD-10-CM | POA: Diagnosis not present

## 2018-12-04 DIAGNOSIS — J449 Chronic obstructive pulmonary disease, unspecified: Secondary | ICD-10-CM | POA: Diagnosis not present

## 2018-12-04 DIAGNOSIS — D649 Anemia, unspecified: Secondary | ICD-10-CM

## 2018-12-04 DIAGNOSIS — R062 Wheezing: Secondary | ICD-10-CM | POA: Diagnosis not present

## 2018-12-04 MED ORDER — FERROUS SULFATE 325 (65 FE) MG PO TABS: 325.0000 mg | ORAL_TABLET | Freq: Every day | ORAL | 3 refills | Status: DC

## 2018-12-04 MED ORDER — OMEPRAZOLE 40 MG PO CPDR: 40.0000 mg | DELAYED_RELEASE_CAPSULE | Freq: Two times a day (BID) | ORAL | 3 refills | Status: DC

## 2018-12-05 NOTE — Telephone Encounter (Signed)
Spoke with pt about her Rx request, provider has sent medication to pharmacy.

## 2018-12-06 ENCOUNTER — Telehealth: Payer: Self-pay | Admitting: Registered Nurse

## 2018-12-06 ENCOUNTER — Ambulatory Visit: Payer: Medicare HMO | Admitting: Adult Health Nurse Practitioner

## 2018-12-06 ENCOUNTER — Ambulatory Visit: Payer: Medicare HMO | Admitting: Registered Nurse

## 2018-12-06 NOTE — Telephone Encounter (Signed)
Canceled appt due to provider being out. lvmtcb and reschedule

## 2018-12-09 ENCOUNTER — Ambulatory Visit: Payer: Medicare HMO | Admitting: Registered Nurse

## 2018-12-10 ENCOUNTER — Encounter: Payer: Self-pay | Admitting: Registered Nurse

## 2018-12-10 ENCOUNTER — Other Ambulatory Visit: Payer: Self-pay

## 2018-12-10 ENCOUNTER — Telehealth: Payer: Self-pay | Admitting: Registered Nurse

## 2018-12-10 ENCOUNTER — Ambulatory Visit (INDEPENDENT_AMBULATORY_CARE_PROVIDER_SITE_OTHER): Payer: Medicare HMO | Admitting: Registered Nurse

## 2018-12-10 ENCOUNTER — Ambulatory Visit: Payer: Medicare HMO | Admitting: Registered Nurse

## 2018-12-10 ENCOUNTER — Other Ambulatory Visit: Payer: Self-pay | Admitting: Registered Nurse

## 2018-12-10 VITALS — BP 148/90 | HR 78 | Temp 98.0°F | Resp 16

## 2018-12-10 DIAGNOSIS — N3281 Overactive bladder: Secondary | ICD-10-CM | POA: Diagnosis not present

## 2018-12-10 DIAGNOSIS — K625 Hemorrhage of anus and rectum: Secondary | ICD-10-CM | POA: Diagnosis not present

## 2018-12-10 DIAGNOSIS — E785 Hyperlipidemia, unspecified: Secondary | ICD-10-CM | POA: Diagnosis not present

## 2018-12-10 DIAGNOSIS — G894 Chronic pain syndrome: Secondary | ICD-10-CM | POA: Diagnosis not present

## 2018-12-10 DIAGNOSIS — N898 Other specified noninflammatory disorders of vagina: Secondary | ICD-10-CM

## 2018-12-10 MED ORDER — POLYETHYLENE GLYCOL 3350 17 G PO PACK: 17.0000 g | PACK | Freq: Every day | ORAL | 11 refills | Status: DC | PRN

## 2018-12-10 MED ORDER — DICLOFENAC SODIUM 75 MG PO TBEC: 75.0000 mg | DELAYED_RELEASE_TABLET | Freq: Two times a day (BID) | ORAL | 0 refills | Status: DC

## 2018-12-10 MED ORDER — OXYBUTYNIN CHLORIDE ER 10 MG PO TB24: 10.0000 mg | ORAL_TABLET | Freq: Every day | ORAL | 1 refills | Status: DC

## 2018-12-10 MED ORDER — ATORVASTATIN CALCIUM 80 MG PO TABS: 80.0000 mg | ORAL_TABLET | Freq: Every day | ORAL | 0 refills | Status: DC

## 2018-12-10 MED ORDER — MICONAZOLE NITRATE 2 % VA CREA: 1.0000 | TOPICAL_CREAM | Freq: Every day | VAGINAL | 0 refills | Status: DC

## 2018-12-10 MED ORDER — DOCUSATE SODIUM 100 MG PO CAPS: 100.0000 mg | ORAL_CAPSULE | Freq: Every day | ORAL | 2 refills | Status: DC | PRN

## 2018-12-10 NOTE — Patient Instructions (Signed)
° ° ° °  If you have lab work done today you will be contacted with your lab results within the next 2 weeks.  If you have not heard from us then please contact us. The fastest way to get your results is to register for My Chart. ° ° °IF you received an x-ray today, you will receive an invoice from [facility]. Please contact [facility] at [phone] with questions or concerns regarding your invoice.  ° °IF you received labwork today, you will receive an invoice from [facility]. Please contact [facility] at [phone] with questions or concerns regarding your invoice.  ° °Our billing staff will not be able to assist you with questions regarding bills from these companies. ° °You will be contacted with the lab results as soon as they are available. The fastest way to get your results is to activate your My Chart account. Instructions are located on the last page of this paperwork. If you have not heard from us regarding the results in 2 weeks, please contact this office. °  ° ° ° °

## 2018-12-10 NOTE — Progress Notes (Signed)
Established Patient Office Visit  Subjective:  Patient ID: Tiffany [name], female    DOB: 12/30/1960  Age: 58 y.o. MRN: [id number]  CC:  Chief Complaint  Patient presents with  . Chronic Conditions     follow-up     HPI Tiffany [name] presents for visit to discuss DMV paperwork.  She needs DMV paperwork filled out to discuss her medical clearance to operate a motor vehicle. I was able to speak with Dr. Montel Culver, her psychiatrist, prior to today's visit, who expressed no concerns with Ms. [place] operating a vehicle. Ms. Lynnette Caffey, her Education officer, museum, agreed.   I was able to fill out a portion of the form ahead of time based on her documented medical history, but left a portion blank in order to confirm information with Ms. Badia herself.   When she presented, she was distraught regarding her mother again breaking into her room and going through her things - there were a number of tangential thoughts and nonlinear thought patterns, but she eventually concluded the story.  She requests a pharmacy change to [facility], as she states [facility] has been continually messing up her prescriptions. On that note - she again requests an increase in her gabapentin, and I again refused with concern for her kidney function.   Past Medical History:  Diagnosis Date  . Agitation 11/22/2017  . AKI (acute kidney injury) ([place]) 11/21/2017  . Anemia 04/24/2011  . Anoxic brain injury ([place]) 09/08/2016   C. Arrest due to respiratory failure and COPD exacerbation  . Anxiety   . Arthritis    "all over" (04/10/2016)  . Asthma 10/18/2010  . Binge eating disorder   . Cardiac arrest ([place]) 09/08/2016   PEA  . Carotid artery stenosis    1-39% bilateral by dopplers 11/2016  . Chronic bronchitis ([place])   . Chronic diastolic (congestive) heart failure ([place])   . Chronic kidney disease    "I see a kidney dr." (04/10/2016)  . Chronic pain syndrome 06/18/2012  . Chronic post-traumatic stress disorder (PTSD) 05/27/2018  .  Chronic respiratory failure with hypoxia and hypercapnia (HCC) 06/22/2015   TRILOGY Vent >AVAPA-ES., Vt target 200-400, Max P 30 , PS max 20 , PS min 6-10 , E Max 6, E Min 4, Rate Auto AVAPS Rate 2 (titrate for pt comfort) , bleed O2 at 5l/m continuous flow .   [name] CKD (chronic kidney disease) stage 3, GFR 30-59 ml/min 12/15/2016  . Closed displaced fracture of fifth metacarpal bone 03/21/2018  . Cocaine use disorder, severe, in sustained remission ([place]) 12/17/2015  . Complication of anesthesia    decreased bp, decreased heart rate  . Constipation   . COPD (chronic obstructive pulmonary disease) ([place]) 07/08/2014  . Depression   . Diabetic neuropathy ([place]) 04/24/2011  . Difficulty with speech 01/24/2018  . Disorder of nervous system   . Drug abuse ([place]) 11/21/2017  . Dyslipidemia 04/24/2011  . Dyspnea   . Elevated troponin 04/28/2012  . Emphysema   . Encephalopathy 11/21/2017  . Essential hypertension 03/22/2016  . Fall   . Fibula fracture 07/10/2016  . Frequent falls 10/11/2017  . GERD (gastroesophageal reflux disease)   . Gout 04/11/2017  . Heart attack ([place]) 1980s  . History of blood transfusion 1994   "couldn't stop bleeding from my period"  . History of drug abuse in remission ([place]) 11/28/2015   Quit in 2017  . Hyperlipidemia LDL goal <70   . Hypertension   . Incontinence   . Manic  depression ([place])   . Morbid obesity ([place]) 10/18/2010  . Obstructive sleep apnea 10/18/2010  . On home oxygen therapy    "6L; 24/7" (04/10/2016)  . OSA on CPAP    "wear mask sometimes" (04/10/2016)  . Paranoid ([place])    "sometimes; I'm on RX for it" (04/10/2016)  . Pneumonia    "I've had it several times; haven't had it since 06/2015" (04/10/2016)  . Prolonged Q-T interval on ECG   . QT prolongation   . Rectal bleeding 12/31/2015  . Right carotid bruit 11/09/2016  . Right foot pain 11/15/2016  . Scabies 06/01/2015  . Schizoaffective disorder, bipolar type ([place]) 04/05/2018  . Seasonal allergies   . Seborrheic  keratoses 12/31/2013  . Seizure ([place]) 01/04/2013  . Seizures ([place])    "don't know what kind; last one was ~ 1 yr ago" (04/10/2016)  . Sinus trouble   . Skin irritation 04/07/2012  . SOB (shortness of breath) 04/10/2016  . Stroke [facility]) 1980s   denies residual on 04/10/2016  . Syncope 01/06/2017  . Thrush 09/19/2013  . Tobacco use disorder 07/22/2014  . Type 2 diabetes mellitus ([place]) 10/18/2010  . Type II diabetes mellitus ([place])   . Upper abdominal pain   . Vaginal discharge 01/03/2018  . Wheezing 04/12/2016  . Wound, breast 01/03/2018    Past Surgical History:  Procedure Laterality Date  . CESAREAN SECTION  1997  . HERNIA REPAIR    . IR CHOLANGIOGRAM EXISTING TUBE  07/20/2016  . IR PERC CHOLECYSTOSTOMY  05/10/2016  . IR RADIOLOGIST EVAL & MGMT  06/08/2016  . IR RADIOLOGIST EVAL & MGMT  06/29/2016  . IR SINUS/FIST TUBE CHK-NON GI  07/12/2016  . RIGHT/LEFT HEART CATH AND CORONARY ANGIOGRAPHY N/A 06/19/2017   Procedure: RIGHT/LEFT HEART CATH AND CORONARY ANGIOGRAPHY;  Surgeon: Jolaine Artist, MD;  Location: [facility];  Service: Cardiovascular;  Laterality: N/A;  . TIBIA IM NAIL INSERTION Right 07/12/2016   Procedure: INTRAMEDULLARY (IM) NAIL RIGHT TIBIA;  Surgeon: Leandrew Koyanagi, MD;  Location: [facility];  Service: Orthopedics;  Laterality: Right;  . UMBILICAL HERNIA REPAIR  ~ 1963   "that's why I don't have a belly button"  . VAGINAL HYSTERECTOMY      Family History  Problem Relation Age of Onset  . Cancer Father        prostate  . Cancer Mother        lung  . Depression Mother   . Depression Sister   . Anxiety disorder Sister   . Schizophrenia Sister   . Bipolar disorder Sister   . Depression Sister   . Depression Brother   . Heart failure Other        cousin    Social History   Socioeconomic History  . Marital status: Widowed    Spouse name: Not on file  . Number of children: 3  . Years of education: Not on file  . Highest education level: Not on file  Occupational  History  . 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 Financial resource strain: Not on file  . Food insecurity    Worry: Sometimes true    Inability: Never true  . Transportation needs    Medical: No    Non-medical: No  Tobacco Use  . Smoking status: Former Smoker    Packs/day: 1.50    Years: 38.00    Pack years: 57.00    Types: Cigarettes    Start  date: 03/13/1977    Quit date: 04/10/2016    Years since quitting: 2.6  . Smokeless tobacco: Never Used  Substance and Sexual Activity  . Alcohol use: No    Alcohol/week: 0.0 standard drinks  . Drug use: No    Types: Cocaine    Comment: 04/10/2016 "last used cocaine back in November 2017"  . Sexual activity: Not Currently    Birth control/protection: Surgical  Lifestyle  . Physical activity    Days per week: Not on file    Minutes per session: Not on file  . Stress: Not on file  Relationships  . Social Herbalist on phone: Not on file    Gets together: Not on file    Attends religious service: Not on file    Active member of club or organization: Not on file    Attends meetings of clubs or organizations: Not on file    Relationship status: Not on file  . Intimate partner violence    Fear of current or ex partner: Not on file    Emotionally abused: Not on file    Physically abused: Not on file    Forced sexual activity: Not on file  Other Topics Concern  . Not on file  Social History Narrative   Has 1 son, [name]   Lives with son and his boyfriend   Her house has ramps and handrails should she ever needs them.    Her mother lives down the street from her and is a good support person in addition to her son.   She drives herself, has private transportation.    Cocaine free since 02/24/16, smoke free since 04/10/16    Outpatient Medications Prior to Visit  Medication Sig Dispense Refill  . albuterol (PROVENTIL) (2.5 MG/3ML) 0.083% nebulizer solution Take 3 mLs (2.5 mg total) by  nebulization every 6 (six) hours as needed for wheezing or shortness of breath. 150 mL 1  . albuterol (VENTOLIN HFA) 108 (90 Base) MCG/ACT inhaler Inhale 2 puffs into the lungs every 6 (six) hours as needed for wheezing or shortness of breath. 18 g 2  . amLODipine (NORVASC) 5 MG tablet TAKE 1 TABLET (5 MG TOTAL) BY MOUTH EVERY MORNING 90 tablet 3  . Asenapine Maleate 10 MG SUBL Place 1 tablet (10 mg total) under the tongue 2 (two) times daily. 60 tablet 1  . aspirin (GOODSENSE ASPIRIN LOW DOSE) 81 MG EC tablet TAKE 1 TABLET (81 MG TOTAL) BY MOUTH DAILY (MORNING). (Patient taking differently: Take 81 mg by mouth daily. ) 90 tablet 3  . budesonide-formoterol (SYMBICORT) 80-4.5 MCG/ACT inhaler Inhale 2 puffs into the lungs 2 (two) times daily. 1 Inhaler 2  . colchicine (COLCRYS) 0.6 MG tablet TAKE 1 TABLET (0.6 MG TOTAL) BY MOUTH 2 (TWO) TIMES DAILY. 180 tablet 3  . cyclobenzaprine (FLEXERIL) 5 MG tablet TAKE 1 TABLET BY MOUTH THREE TIMES A DAY AS NEEDED FOR MUSCLE SPASMS 60 tablet 0  . diazepam (VALIUM) 10 MG tablet Take 1 tablet (10 mg total) by mouth 2 (two) times daily. 60 tablet 2  . diclofenac sodium (VOLTAREN) 1 % GEL Apply 2 g topically 4 (four) times daily. 100 g 0  . doxazosin (CARDURA) 2 MG tablet Take 1 tablet (2 mg total) by mouth daily. 30 tablet 0  . escitalopram (LEXAPRO) 20 MG tablet Take 1 tablet (20 mg total) by mouth daily. 90 tablet 0  . febuxostat (ULORIC) 40 MG tablet Take 40 mg by mouth  daily.     . ferrous sulfate 325 (65 FE) MG tablet Take 1 tablet (325 mg total) by mouth daily with breakfast. 90 tablet 3  . flunisolide (NASALIDE) 25 MCG/ACT (0.025%) SOLN Place 2 sprays into the nose daily. 1 Bottle 2  . Fluticasone-Umeclidin-Vilant (TRELEGY ELLIPTA) 100-62.5-25 MCG/INH AEPB Inhale 1 puff into the lungs daily. 60 each 1  . gabapentin (NEURONTIN) 600 MG tablet Take 1 tablet (600 mg total) by mouth 2 (two) times daily. 180 tablet 1  . lisdexamfetamine (VYVANSE) 70 MG capsule  Take 1 capsule (70 mg total) by mouth daily. 30 capsule 0  . metolazone (ZAROXOLYN) 2.5 MG tablet Take 1 tablet (2.5 mg total) by mouth daily. (Patient taking differently: Take 2.5 mg by mouth 3 (three) times a week. ) 90 tablet 0  . montelukast (SINGULAIR) 10 MG tablet TAKE 1 TABLET BY MOUTH EVERYDAY AT BEDTIME 90 tablet 0  . Multiple Vitamin (MULTIVITAMIN WITH MINERALS) TABS tablet Take 1 tablet by mouth daily.    [name] omeprazole (PRILOSEC) 40 MG capsule Take 1 capsule (40 mg total) by mouth 2 (two) times daily. 180 capsule 3  . potassium chloride (K-DUR) 10 MEQ tablet Take 10 mEq by mouth 2 (two) times daily.    . prazosin (MINIPRESS) 1 MG capsule Take 1 capsule (1 mg total) by mouth at bedtime. 30 capsule 2  . tacrolimus (PROTOPIC) 0.03 % ointment Apply topically 2 (two) times daily. 100 g 0  . terconazole (TERAZOL 3) 0.8 % vaginal cream Apply pea sized amount to clitoris nightly. 20 g 0  . torsemide (DEMADEX) 20 MG tablet Take 4 tablets (80 mg total) by mouth 2 (two) times daily. 240 tablet 4  . traZODone (DESYREL) 100 MG tablet Take 1 tablet (100 mg total) by mouth at bedtime. 90 tablet 0  . traZODone (DESYREL) 50 MG tablet Take 1 tablet (50 mg total) by mouth daily. 30 tablet 0  . zinc oxide 20 % ointment Apply 1 application topically as needed for irritation. 56.7 g 5  . atorvastatin (LIPITOR) 80 MG tablet Take 1 tablet (80 mg total) by mouth daily. 90 tablet 0  . diclofenac (VOLTAREN) 75 MG EC tablet Take 75 mg by mouth daily as needed for mild pain or moderate pain.     [name] docusate sodium (COLACE) 100 MG capsule Take 1 capsule (100 mg total) by mouth daily as needed for mild constipation or moderate constipation. 10 capsule 2  . glucose blood (PRECISION QID TEST) test strip by Does not apply route.    . miconazole (MICONAZOLE 7) 2 % vaginal cream Place 1 Applicatorful vaginally at bedtime. 45 g 0  . oxybutynin (DITROPAN-XL) 10 MG 24 hr tablet Take 1-2 tablets (10-20 mg total) by mouth at  bedtime. TAKE 1 TABLET BY MOUTH EVERYDAY AT BEDTIME 60 tablet 1  . polyethylene glycol (MIRALAX / GLYCOLAX) 17 g packet Take 17 g by mouth daily as needed for mild constipation or moderate constipation.      No facility-administered medications prior to visit.     Allergies  Allergen Reactions  . Hydrocodone Shortness Of Breath  . Hydrocodone-Acetaminophen Shortness Of Breath  . Hydroxyzine Anaphylaxis and Shortness Of Breath  . Latuda [Lurasidone Hcl] Anaphylaxis  . Lurasidone Anaphylaxis  . Magnesium-Containing Compounds Anaphylaxis  . Prednisone Anaphylaxis, Swelling and Other (See Comments)    Tongue swelling  . Tramadol Anaphylaxis and Swelling  . Codeine Nausea And Vomiting  . Other Rash  . Sulfa Antibiotics Itching  .  Tape Rash    ROS Review of Systems  Constitutional: Negative.   HENT: Negative.   Eyes: Negative.   Respiratory: Negative.   Cardiovascular: Negative.   Gastrointestinal: Negative.   Endocrine: Negative.   Genitourinary: Negative.   Musculoskeletal: Negative.   Skin: Negative.   Allergic/Immunologic: Negative.   Neurological: Negative.   Hematological: Negative.   Psychiatric/Behavioral: Negative.   All other systems reviewed and are negative.     Objective:    Physical Exam  Constitutional: [name] oriented to person, place, and time. She appears well-developed and well-nourished. No distress.  Cardiovascular: Normal rate and regular rhythm.  Pulmonary/Chest: Effort normal. No respiratory distress.  Neurological: [name] alert and oriented to person, place, and time.  Skin: Skin is warm and dry. No rash noted. [name] not diaphoretic. No erythema. No pallor.  Psychiatric: Her speech is normal. Judgment normal. Her affect is angry, blunt and labile. [name] agitated and aggressive. Thought content is paranoid. Cognition and memory are normal. She expresses no homicidal and no suicidal ideation.  Nursing note and vitals reviewed.   BP (!) 148/90    Pulse 78   Temp 98 F (36.7 C) (Oral)   Resp 16   SpO2 98%  Wt Readings from Last 3 Encounters:  10/22/18 153 lb (69.4 kg)  10/22/18 153 lb (69.4 kg)  10/19/18 153 lb (69.4 kg)     Health Maintenance Due  Topic Date Due  . COLONOSCOPY  05/10/2009  . FOOT EXAM  03/12/2018  . OPHTHALMOLOGY EXAM  04/20/2018    There are no preventive care reminders to display for this patient.  Lab Results  Component Value Date   TSH 0.563 03/15/2016   Lab Results  Component Value Date   WBC 6.7 11/08/2018   HGB 8.4 (L) 11/08/2018   HCT 25.7 (L) 11/08/2018   MCV 87 11/08/2018   PLT 311 11/08/2018   Lab Results  Component Value Date   NA 143 11/08/2018   K 4.1 11/08/2018   CO2 27 11/08/2018   GLUCOSE 92 11/08/2018   BUN 16 11/08/2018   CREATININE 1.39 (H) 11/08/2018   BILITOT 0.3 11/08/2018   ALKPHOS 134 (H) 11/08/2018   AST 17 11/08/2018   ALT 11 11/08/2018   PROT 6.2 11/08/2018   ALBUMIN 4.0 11/08/2018   CALCIUM 9.5 11/08/2018   ANIONGAP 9 10/22/2018   Lab Results  Component Value Date   CHOL 127 08/05/2018   Lab Results  Component Value Date   HDL 62 08/05/2018   Lab Results  Component Value Date   LDLCALC 54 08/05/2018   Lab Results  Component Value Date   TRIG 53 08/05/2018   Lab Results  Component Value Date   CHOLHDL 2.0 08/05/2018   Lab Results  Component Value Date   HGBA1C 5.6 11/08/2018      Assessment & Plan:   Problem List Items Addressed This Visit      Genitourinary   Overactive bladder   Relevant Medications   oxybutynin (DITROPAN-XL) 10 MG 24 hr tablet     Other   Dyslipidemia   Relevant Medications   atorvastatin (LIPITOR) 80 MG tablet   Chronic pain syndrome - Primary   Relevant Medications   diclofenac (VOLTAREN) 75 MG EC tablet   Vaginal discharge   Relevant Medications   miconazole (MICONAZOLE 7) 2 % vaginal cream    Other Visit Diagnoses    Rectal bleeding       Relevant Medications  docusate sodium (COLACE) 100  MG capsule   polyethylene glycol (MIRALAX / GLYCOLAX) 17 g packet      Meds ordered this encounter  Medications  . atorvastatin (LIPITOR) 80 MG tablet    Sig: Take 1 tablet (80 mg total) by mouth daily.    Dispense:  90 tablet    Refill:  0    Order Specific Question:   Supervising Provider    Answer:   Delia Chimes A [id number]  . diclofenac (VOLTAREN) 75 MG EC tablet    Sig: Take 1 tablet (75 mg total) by mouth 2 (two) times daily.    Dispense:  180 tablet    Refill:  0    Order Specific Question:   Supervising Provider    Answer:   Delia Chimes A [id number]  . docusate sodium (COLACE) 100 MG capsule    Sig: Take 1 capsule (100 mg total) by mouth daily as needed for mild constipation or moderate constipation.    Dispense:  10 capsule    Refill:  2    Order Specific Question:   Supervising Provider    Answer:   Delia Chimes A [id number]  . miconazole (MICONAZOLE 7) 2 % vaginal cream    Sig: Place 1 Applicatorful vaginally at bedtime.    Dispense:  45 g    Refill:  0    Order Specific Question:   Supervising Provider    Answer:   Delia Chimes A [id number]  . polyethylene glycol (MIRALAX / GLYCOLAX) 17 g packet    Sig: Take 17 g by mouth daily as needed for mild constipation or moderate constipation.    Dispense:  14 each    Refill:  11    Order Specific Question:   Supervising Provider    Answer:   Delia Chimes A [id number]  . oxybutynin (DITROPAN-XL) 10 MG 24 hr tablet    Sig: Take 1-2 tablets (10-20 mg total) by mouth at bedtime.    Dispense:  60 tablet    Refill:  1    Order Specific Question:   Supervising Provider    Answer:   Forrest Moron [id number]    Follow-up: No follow-ups on file.   PLAN  Unfortunately, when discussing the paperwork with Ms. Feldhaus, she became concerned about the dates of some events and diagnoses. I offered to reprint the form and start over with her guidance - but this quickly transitioned to her being upset about the inclusion  of some information on the form. The included information that she was concerned about related to her: heart failure, mental health, use of oxygen in relation to her respiratory failure, her mental health and the treatment she was receiving. She was significantly concerned regarding how this would impact her ability to drive - both from a standpoint of being concerned as to why the DMV wanted to know this information, and that if they knew this information, they may not allow her to drive.   I unfortunately had to be blunt with Ms. Boomer that I would not lie - through commission or omission - on a government form, let alone any other healthcare documentation.   At this stage, we had run this visit nearly one hour. I encouraged Ms Hildenbrand to take the form with her, give her suggested corrections, and book a future appointment to fill out this form together. I advised her that I almost certainly would not make the changes she is hoping for - she is upset by  this, and used a number of curse words before I left the room.  Patient encouraged to call clinic with any questions, comments, or concerns.    Maximiano Coss, NP

## 2018-12-10 NOTE — Telephone Encounter (Signed)
Pt is wanting you to download a report from [facility] that she will need today for her appointment.  She stated she's been trying for a month and can not get it.

## 2018-12-10 NOTE — Telephone Encounter (Signed)
Pt requesting a 90 day supply on Oxybutynin 10 mg. It was refilled today for 60 tablets. Next office visit is 12/11/2018 Dr. Orland Mustard pcp

## 2018-12-10 NOTE — Telephone Encounter (Signed)
Forms has been printed and will be worked on at her appointment today in the office

## 2018-12-11 ENCOUNTER — Ambulatory Visit (INDEPENDENT_AMBULATORY_CARE_PROVIDER_SITE_OTHER): Payer: Medicare HMO | Admitting: Registered Nurse

## 2018-12-11 ENCOUNTER — Encounter: Payer: Self-pay | Admitting: Registered Nurse

## 2018-12-11 VITALS — BP 140/88 | HR 78 | Temp 98.8°F | Ht 65.0 in

## 2018-12-11 DIAGNOSIS — Z5320 Procedure and treatment not carried out because of patient's decision for unspecified reasons: Secondary | ICD-10-CM

## 2018-12-11 NOTE — Progress Notes (Signed)
Established Patient Office Visit  Subjective:  Patient ID: Tiffany [name], female    DOB: [birth year]  Age: 58 y.o. MRN: [id number]  CC:  Chief Complaint  Patient presents with  . Form Completion    HPI Tiffany [name] presents for [facility] medical clearance form.   Ms. Beahm was here yesterday for this same paperwork. As documented, she became agitated when she perceived some of the information on the sheet to be incorrect. In addition, she expressed anger towards my unwillingness to omit information that she viewed may not be necessary to the [facility]. I explained to her at that time that I will fill out the forms honestly and completely with omission - if she wanted to go home and correct the forms, she was welcome to return to restart the forms from scratch.  She returned today with a blank set of the forms. She stated that the [facility] had told her that they only needed the first page filled out - however, the first page offers prompts for CV, Respiratory, Neurological, and endocrine disorders. If the patient is affected by these, further pages are warranted. Given [name]'s documented history of CHF, respiratory failure, and a seizure on 10/19/18, we discussed that I would want her to follow up with specialists prior to having me sign off on this form - something I have suggested in the past. She, unfortunately, was upset with this plan.  Past Medical History:  Diagnosis Date  . Agitation 11/22/2017  . AKI (acute kidney injury) ([place]) 11/21/2017  . Anemia 04/24/2011  . Anoxic brain injury ([place]) 09/08/2016   C. Arrest due to respiratory failure and COPD exacerbation  . Anxiety   . Arthritis    "all over" (04/10/2016)  . Asthma 10/18/2010  . Binge eating disorder   . Cardiac arrest ([place]) 09/08/2016   PEA  . Carotid artery stenosis    1-39% bilateral by dopplers 11/2016  . Chronic bronchitis ([place])   . Chronic diastolic (congestive) heart failure ([place])   . Chronic kidney disease    "I see a kidney  dr." (04/10/2016)  . Chronic pain syndrome 06/18/2012  . Chronic post-traumatic stress disorder (PTSD) 05/27/2018  . Chronic respiratory failure with hypoxia and hypercapnia (HCC) 06/22/2015   TRILOGY Vent >AVAPA-ES., Vt target 200-400, Max P 30 , PS max 20 , PS min 6-10 , E Max 6, E Min 4, Rate Auto AVAPS Rate 2 (titrate for pt comfort) , bleed O2 at 5l/m continuous flow .   [name] CKD (chronic kidney disease) stage 3, GFR 30-59 ml/min 12/15/2016  . Closed displaced fracture of fifth metacarpal bone 03/21/2018  . Cocaine use disorder, severe, in sustained remission ([place]) 12/17/2015  . Complication of anesthesia    decreased bp, decreased heart rate  . Constipation   . COPD (chronic obstructive pulmonary disease) ([place]) 07/08/2014  . Depression   . Diabetic neuropathy ([place]) 04/24/2011  . Difficulty with speech 01/24/2018  . Disorder of nervous system   . Drug abuse ([place]) 11/21/2017  . Dyslipidemia 04/24/2011  . Dyspnea   . Elevated troponin 04/28/2012  . Emphysema   . Encephalopathy 11/21/2017  . Essential hypertension 03/22/2016  . Fall   . Fibula fracture 07/10/2016  . Frequent falls 10/11/2017  . GERD (gastroesophageal reflux disease)   . Gout 04/11/2017  . Heart attack ([place]) 1980s  . History of blood transfusion 1994   "couldn't stop bleeding from my period"  . History of drug abuse in remission ([place]) 11/28/2015  Quit in 2017  . Hyperlipidemia LDL goal <70   . Hypertension   . Incontinence   . Manic depression ([place])   . Morbid obesity ([place]) 10/18/2010  . Obstructive sleep apnea 10/18/2010  . On home oxygen therapy    "6L; 24/7" (04/10/2016)  . OSA on CPAP    "wear mask sometimes" (04/10/2016)  . Paranoid ([place])    "sometimes; I'm on RX for it" (04/10/2016)  . Pneumonia    "I've had it several times; haven't had it since 06/2015" (04/10/2016)  . Prolonged Q-T interval on ECG   . QT prolongation   . Rectal bleeding 12/31/2015  . Right carotid bruit 11/09/2016  . Right foot pain 11/15/2016  . Scabies  06/01/2015  . Schizoaffective disorder, bipolar type ([place]) 04/05/2018  . Seasonal allergies   . Seborrheic keratoses 12/31/2013  . Seizure ([place]) 01/04/2013  . Seizures ([place])    "don't know what kind; last one was ~ 1 yr ago" (04/10/2016)  . Sinus trouble   . Skin irritation 04/07/2012  . SOB (shortness of breath) 04/10/2016  . Stroke [id number]) 1980s   denies residual on 04/10/2016  . Syncope 01/06/2017  . Thrush 09/19/2013  . Tobacco use disorder 07/22/2014  . Type 2 diabetes mellitus ([place]) 10/18/2010  . Type II diabetes mellitus ([place])   . Upper abdominal pain   . Vaginal discharge 01/03/2018  . Wheezing 04/12/2016  . Wound, breast 01/03/2018    Past Surgical History:  Procedure Laterality Date  . CESAREAN SECTION  1997  . HERNIA REPAIR    . IR CHOLANGIOGRAM EXISTING TUBE  07/20/2016  . IR PERC CHOLECYSTOSTOMY  05/10/2016  . IR RADIOLOGIST EVAL & MGMT  06/08/2016  . IR RADIOLOGIST EVAL & MGMT  06/29/2016  . IR SINUS/FIST TUBE CHK-NON GI  07/12/2016  . RIGHT/LEFT HEART CATH AND CORONARY ANGIOGRAPHY N/A 06/19/2017   Procedure: RIGHT/LEFT HEART CATH AND CORONARY ANGIOGRAPHY;  Surgeon: Jolaine Artist, MD;  Location: [place] [facility];  Service: Cardiovascular;  Laterality: N/A;  . TIBIA IM NAIL INSERTION Right 07/12/2016   Procedure: INTRAMEDULLARY (IM) NAIL RIGHT TIBIA;  Surgeon: Leandrew Koyanagi, MD;  Location: [place];  Service: Orthopedics;  Laterality: Right;  . UMBILICAL HERNIA REPAIR  ~ 1963   "that's why I don't have a belly button"  . VAGINAL HYSTERECTOMY      Family History  Problem Relation Age of Onset  . Cancer Father        prostate  . Cancer Mother        lung  . Depression Mother   . Depression Sister   . Anxiety disorder Sister   . Schizophrenia Sister   . Bipolar disorder Sister   . Depression Sister   . Depression Brother   . Heart failure Other        cousin    Social History   Socioeconomic History  . Marital status: Widowed    Spouse name: Not on file  .  Number of children: 3  . Years of education: Not on file  . Highest education level: Not on file  Occupational History  . 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 Financial resource strain: Not on file  . Food insecurity    Worry: Sometimes true    Inability: Never true  . Transportation needs    Medical: No    Non-medical: No  Tobacco Use  . Smoking status: Former Smoker    Packs/day:  1.50    Years: 38.00    Pack years: 57.00    Types: Cigarettes    Start date: 03/13/1977    Quit date: 04/10/2016    Years since quitting: 2.6  . Smokeless tobacco: Never Used  Substance and Sexual Activity  . Alcohol use: No    Alcohol/week: 0.0 standard drinks  . Drug use: No    Types: Cocaine    Comment: 04/10/2016 "last used cocaine back in November 2017"  . Sexual activity: Not Currently    Birth control/protection: Surgical  Lifestyle  . Physical activity    Days per week: Not on file    Minutes per session: Not on file  . Stress: Not on file  Relationships  . Social Herbalist on phone: Not on file    Gets together: Not on file    Attends religious service: Not on file    Active member of club or organization: Not on file    Attends meetings of clubs or organizations: Not on file    Relationship status: Not on file  . Intimate partner violence    Fear of current or ex partner: Not on file    Emotionally abused: Not on file    Physically abused: Not on file    Forced sexual activity: Not on file  Other Topics Concern  . Not on file  Social History Narrative   Has 1 son, [name]   Lives with son and his boyfriend   Her house has ramps and handrails should she ever needs them.    Her mother lives down the street from her and is a good support person in addition to her son.   She drives herself, has private transportation.    Cocaine free since 02/24/16, smoke free since 04/10/16    Outpatient Medications Prior to Visit  Medication Sig  Dispense Refill  . albuterol (PROVENTIL) (2.5 MG/3ML) 0.083% nebulizer solution Take 3 mLs (2.5 mg total) by nebulization every 6 (six) hours as needed for wheezing or shortness of breath. 150 mL 1  . albuterol (VENTOLIN HFA) 108 (90 Base) MCG/ACT inhaler Inhale 2 puffs into the lungs every 6 (six) hours as needed for wheezing or shortness of breath. 18 g 2  . amLODipine (NORVASC) 5 MG tablet TAKE 1 TABLET (5 MG TOTAL) BY MOUTH EVERY MORNING 90 tablet 3  . Asenapine Maleate 10 MG SUBL Place 1 tablet (10 mg total) under the tongue 2 (two) times daily. 60 tablet 1  . aspirin (GOODSENSE ASPIRIN LOW DOSE) 81 MG EC tablet TAKE 1 TABLET (81 MG TOTAL) BY MOUTH DAILY (MORNING). (Patient taking differently: Take 81 mg by mouth daily. ) 90 tablet 3  . atorvastatin (LIPITOR) 80 MG tablet Take 1 tablet (80 mg total) by mouth daily. 90 tablet 0  . budesonide-formoterol (SYMBICORT) 80-4.5 MCG/ACT inhaler Inhale 2 puffs into the lungs 2 (two) times daily. 1 Inhaler 2  . colchicine (COLCRYS) 0.6 MG tablet TAKE 1 TABLET (0.6 MG TOTAL) BY MOUTH 2 (TWO) TIMES DAILY. 180 tablet 3  . cyclobenzaprine (FLEXERIL) 5 MG tablet TAKE 1 TABLET BY MOUTH THREE TIMES A DAY AS NEEDED FOR MUSCLE SPASMS 60 tablet 0  . diazepam (VALIUM) 10 MG tablet Take 1 tablet (10 mg total) by mouth 2 (two) times daily. 60 tablet 2  . diclofenac (VOLTAREN) 75 MG EC tablet Take 1 tablet (75 mg total) by mouth 2 (two) times daily. 180 tablet 0  . diclofenac sodium (VOLTAREN) 1 %  GEL Apply 2 g topically 4 (four) times daily. 100 g 0  . docusate sodium (COLACE) 100 MG capsule Take 1 capsule (100 mg total) by mouth daily as needed for mild constipation or moderate constipation. 10 capsule 2  . doxazosin (CARDURA) 2 MG tablet Take 1 tablet (2 mg total) by mouth daily. 30 tablet 0  . escitalopram (LEXAPRO) 20 MG tablet Take 1 tablet (20 mg total) by mouth daily. 90 tablet 0  . febuxostat (ULORIC) 40 MG tablet Take 40 mg by mouth daily.     . ferrous  sulfate 325 (65 FE) MG tablet Take 1 tablet (325 mg total) by mouth daily with breakfast. 90 tablet 3  . flunisolide (NASALIDE) 25 MCG/ACT (0.025%) SOLN Place 2 sprays into the nose daily. 1 Bottle 2  . Fluticasone-Umeclidin-Vilant (TRELEGY ELLIPTA) 100-62.5-25 MCG/INH AEPB Inhale 1 puff into the lungs daily. 60 each 1  . gabapentin (NEURONTIN) 600 MG tablet Take 1 tablet (600 mg total) by mouth 2 (two) times daily. 180 tablet 1  . lisdexamfetamine (VYVANSE) 70 MG capsule Take 1 capsule (70 mg total) by mouth daily. 30 capsule 0  . metolazone (ZAROXOLYN) 2.5 MG tablet Take 1 tablet (2.5 mg total) by mouth daily. (Patient taking differently: Take 2.5 mg by mouth 3 (three) times a week. ) 90 tablet 0  . miconazole (MICONAZOLE 7) 2 % vaginal cream Place 1 Applicatorful vaginally at bedtime. 45 g 0  . montelukast (SINGULAIR) 10 MG tablet TAKE 1 TABLET BY MOUTH EVERYDAY AT BEDTIME 90 tablet 0  . Multiple Vitamin (MULTIVITAMIN WITH MINERALS) TABS tablet Take 1 tablet by mouth daily.    [name] omeprazole (PRILOSEC) 40 MG capsule Take 1 capsule (40 mg total) by mouth 2 (two) times daily. 180 capsule 3  . oxybutynin (DITROPAN-XL) 10 MG 24 hr tablet Take 1-2 tablets (10-20 mg total) by mouth at bedtime. 60 tablet 1  . polyethylene glycol (MIRALAX / GLYCOLAX) 17 g packet Take 17 g by mouth daily as needed for mild constipation or moderate constipation. 14 each 11  . potassium chloride (K-DUR) 10 MEQ tablet Take 10 mEq by mouth 2 (two) times daily.    . prazosin (MINIPRESS) 1 MG capsule Take 1 capsule (1 mg total) by mouth at bedtime. 30 capsule 2  . tacrolimus (PROTOPIC) 0.03 % ointment Apply topically 2 (two) times daily. 100 g 0  . terconazole (TERAZOL 3) 0.8 % vaginal cream Apply pea sized amount to clitoris nightly. 20 g 0  . torsemide (DEMADEX) 20 MG tablet Take 4 tablets (80 mg total) by mouth 2 (two) times daily. 240 tablet 4  . traZODone (DESYREL) 100 MG tablet Take 1 tablet (100 mg total) by mouth at  bedtime. 90 tablet 0  . traZODone (DESYREL) 50 MG tablet Take 1 tablet (50 mg total) by mouth daily. 30 tablet 0  . zinc oxide 20 % ointment Apply 1 application topically as needed for irritation. 56.7 g 5   No facility-administered medications prior to visit.     Allergies  Allergen Reactions  . Hydrocodone Shortness Of Breath  . Hydrocodone-Acetaminophen Shortness Of Breath  . Hydroxyzine Anaphylaxis and Shortness Of Breath  . Latuda [Lurasidone Hcl] Anaphylaxis  . Lurasidone Anaphylaxis  . Magnesium-Containing Compounds Anaphylaxis  . Prednisone Anaphylaxis, Swelling and Other (See Comments)    Tongue swelling  . Tramadol Anaphylaxis and Swelling  . Codeine Nausea And Vomiting  . Other Rash  . Sulfa Antibiotics Itching  . Tape Rash    ROS  Review of Systems Unable to complete due to patient agitation   Objective:    Physical Exam Unable to complete as visit was cut short.  BP 140/88   Pulse 78   Temp 98.8 F (37.1 C)   Ht 5\' 5"  (1.651 m)   SpO2 98%   BMI 25.46 kg/m  Wt Readings from Last 3 Encounters:  10/22/18 153 lb (69.4 kg)  10/22/18 153 lb (69.4 kg)  10/19/18 153 lb (69.4 kg)     Health Maintenance Due  Topic Date Due  . COLONOSCOPY  05/10/2009  . FOOT EXAM  03/12/2018  . OPHTHALMOLOGY EXAM  04/20/2018    There are no preventive care reminders to display for this patient.  Lab Results  Component Value Date   TSH 0.563 03/15/2016   Lab Results  Component Value Date   WBC 6.7 11/08/2018   HGB 8.4 (L) 11/08/2018   HCT 25.7 (L) 11/08/2018   MCV 87 11/08/2018   PLT 311 11/08/2018   Lab Results  Component Value Date   NA 143 11/08/2018   K 4.1 11/08/2018   CO2 27 11/08/2018   GLUCOSE 92 11/08/2018   BUN 16 11/08/2018   CREATININE 1.39 (H) 11/08/2018   BILITOT 0.3 11/08/2018   ALKPHOS 134 (H) 11/08/2018   AST 17 11/08/2018   ALT 11 11/08/2018   PROT 6.2 11/08/2018   ALBUMIN 4.0 11/08/2018   CALCIUM 9.5 11/08/2018   ANIONGAP 9  10/22/2018   Lab Results  Component Value Date   CHOL 127 08/05/2018   Lab Results  Component Value Date   HDL 62 08/05/2018   Lab Results  Component Value Date   LDLCALC 54 08/05/2018   Lab Results  Component Value Date   TRIG 53 08/05/2018   Lab Results  Component Value Date   CHOLHDL 2.0 08/05/2018   Lab Results  Component Value Date   HGBA1C 5.6 11/08/2018      Assessment & Plan:   Problem List Items Addressed This Visit    None    Visit Diagnoses    Refused assessment of physical health    -  Primary      No orders of the defined types were placed in this encounter.   Follow-up: No follow-ups on file.   PLAN  Ms. Baldridge was seen in the ED for a possible seizure, and she added that a mini-stroke was a possibility as well. For these, I would require her to see a cardiologist and neurologist to get clearance for me to sign this form. She refused to accept these referrals.  Unfortunately, she became agitated and used strong, profane language against me, at which point I determined the visit was over and I let her know that I would no longer be able to be her primary care provider. I had [place] escort her out of the building. I sent an email to [place] at [place] to discuss termination of my relationship with this patient.   Maximiano Coss, NP

## 2018-12-11 NOTE — Patient Instructions (Signed)
° ° ° °  If you have lab work done today you will be contacted with your lab results within the next 2 weeks.  If you have not heard from us then please contact us. The fastest way to get your results is to register for My Chart. ° ° °IF you received an x-ray today, you will receive an invoice from [facility]. Please contact [facility] at [phone] with questions or concerns regarding your invoice.  ° °IF you received labwork today, you will receive an invoice from [facility]. Please contact [facility] at [phone] with questions or concerns regarding your invoice.  ° °Our billing staff will not be able to assist you with questions regarding bills from these companies. ° °You will be contacted with the lab results as soon as they are available. The fastest way to get your results is to activate your My Chart account. Instructions are located on the last page of this paperwork. If you have not heard from us regarding the results in 2 weeks, please contact this office. °  ° ° ° °

## 2018-12-17 ENCOUNTER — Other Ambulatory Visit: Payer: Medicare HMO | Admitting: Internal Medicine

## 2018-12-17 ENCOUNTER — Ambulatory Visit (INDEPENDENT_AMBULATORY_CARE_PROVIDER_SITE_OTHER): Payer: Medicare HMO | Admitting: Psychiatry

## 2018-12-17 ENCOUNTER — Other Ambulatory Visit: Payer: Self-pay

## 2018-12-17 DIAGNOSIS — F4312 Post-traumatic stress disorder, chronic: Secondary | ICD-10-CM

## 2018-12-17 DIAGNOSIS — F250 Schizoaffective disorder, bipolar type: Secondary | ICD-10-CM

## 2018-12-17 DIAGNOSIS — I5032 Chronic diastolic (congestive) heart failure: Secondary | ICD-10-CM | POA: Diagnosis not present

## 2018-12-17 MED ORDER — PRAZOSIN HCL 2 MG PO CAPS: 2.0000 mg | ORAL_CAPSULE | Freq: Every day | ORAL | 2 refills | Status: DC

## 2018-12-17 MED ORDER — TRAZODONE HCL 100 MG PO TABS: 100.0000 mg | ORAL_TABLET | Freq: Every day | ORAL | 0 refills | Status: DC

## 2018-12-17 MED ORDER — ESCITALOPRAM OXALATE 20 MG PO TABS: 40.0000 mg | ORAL_TABLET | Freq: Every day | ORAL | 0 refills | Status: DC

## 2018-12-17 MED ORDER — ASENAPINE MALEATE 10 MG SL SUBL: 10.0000 mg | SUBLINGUAL_TABLET | Freq: Two times a day (BID) | SUBLINGUAL | 1 refills | Status: DC

## 2018-12-17 MED ORDER — LISDEXAMFETAMINE DIMESYLATE 70 MG PO CAPS: 70.0000 mg | ORAL_CAPSULE | Freq: Every day | ORAL | 0 refills | Status: DC

## 2018-12-17 MED ORDER — TRAZODONE HCL 50 MG PO TABS: 50.0000 mg | ORAL_TABLET | Freq: Every day | ORAL | 0 refills | Status: DC

## 2018-12-17 NOTE — Progress Notes (Signed)
[facility] [place] MD/PA/NP OP Progress Note  12/17/2018 4:53 PM Tiffany [name]  MRN:  [id number] Interview was conducted by phone and I verified that I was speaking with the correct person using two identifiers. I discussed the limitations of evaluation and management by telemedicine and  the availability of in person appointments. Patient expressed understanding and agreed to proceed.  Chief Complaint: Anxiety, depression.  HPI: 58yo divorced AAF with schizoaffective disorder bipolar type(vs paranoid schizophrenia),chronicPTSD,remote hx ofcocaineaddiction (clean for 4 years)and bingeeating disorder.She tried ziprazidone but developedQTC prolongation on itso it was changed toSaphriswhich she believes is a best medication she has ever been on. [name] also on escitalopram and was on Vyvanse for binge eating disorder. Tiffany [name] used to weigh over 300 lbs and since starting Vyvanse her binge eating is well controlled and she was able to lose a lot of weight). She continues to take diazepam 10 mg bid (was on tid earlier) and trazodone for sleep (100 mg plus 50 mg in am "for nerves").She has a strained relationship with her family - mother in particular. Continues toaccuse mother of going though her belongings, stealing from her and she has called police on her few times. She feels depressed about this situation, feel abandoned and feels that family does not "want me to succeed but I will". Tiffany [name] denies feeling suicidal, no hallucinations reported.She reports being now "in a safe place" but still expresses paranoid fears about her family: [name] compliant with her medications, does not abuse street drugs. It was a difficult months for her: first because of increased paranoia she was in a geropsychiatry unit in [place] where her Saphris dose was increased while diazepam decreased. [name] now back with her mother and relationship appears to be better. Tiffany [name] still is paranoid at baseline and ruminates on past misdeeds  of different people towards her:"They are stealing from me".  She continues counseling with Lise Auer but wants to look for a new therapist who will be able to see her twice weekly. .  Visit Diagnosis:    ICD-10-CM   1. Chronic post-traumatic stress disorder (PTSD)  F43.12   2. Chronic diastolic congestive heart failure (HCC)  I50.32 prazosin (MINIPRESS) 2 MG capsule  3. Schizoaffective disorder, bipolar type ([place])  F25.0     Past Psychiatric History: Please see intake H&P.  Past Medical History:  Past Medical History:  Diagnosis Date  . Agitation 11/22/2017  . AKI (acute kidney injury) ([place]) 11/21/2017  . Anemia 04/24/2011  . Anoxic brain injury ([place]) 09/08/2016   C. Arrest due to respiratory failure and COPD exacerbation  . Anxiety   . Arthritis    "all over" (04/10/2016)  . Asthma 10/18/2010  . Binge eating disorder   . Cardiac arrest ([place]) 09/08/2016   PEA  . Carotid artery stenosis    1-39% bilateral by dopplers 11/2016  . Chronic bronchitis ([place])   . Chronic diastolic (congestive) heart failure ([place])   . Chronic kidney disease    "I see a kidney dr." (04/10/2016)  . Chronic pain syndrome 06/18/2012  . Chronic post-traumatic stress disorder (PTSD) 05/27/2018  . Chronic respiratory failure with hypoxia and hypercapnia (HCC) 06/22/2015   TRILOGY Vent >AVAPA-ES., Vt target 200-400, Max P 30 , PS max 20 , PS min 6-10 , E Max 6, E Min 4, Rate Auto AVAPS Rate 2 (titrate for pt comfort) , bleed O2 at 5l/m continuous flow .   [name] CKD (chronic kidney disease) stage 3, GFR 30-59 ml/min 12/15/2016  .  Closed displaced fracture of fifth metacarpal bone 03/21/2018  . Cocaine use disorder, severe, in sustained remission ([place]) 12/17/2015  . Complication of anesthesia    decreased bp, decreased heart rate  . Constipation   . COPD (chronic obstructive pulmonary disease) ([place]) 07/08/2014  . Depression   . Diabetic neuropathy ([place]) 04/24/2011  . Difficulty with speech 01/24/2018  . Disorder of nervous  system   . Drug abuse ([place]) 11/21/2017  . Dyslipidemia 04/24/2011  . Dyspnea   . Elevated troponin 04/28/2012  . Emphysema   . Encephalopathy 11/21/2017  . Essential hypertension 03/22/2016  . Fall   . Fibula fracture 07/10/2016  . Frequent falls 10/11/2017  . GERD (gastroesophageal reflux disease)   . Gout 04/11/2017  . Heart attack ([place]) 1980s  . History of blood transfusion 1994   "couldn't stop bleeding from my period"  . History of drug abuse in remission ([place]) 11/28/2015   Quit in 2017  . Hyperlipidemia LDL goal <70   . Hypertension   . Incontinence   . Manic depression ([place])   . Morbid obesity ([place]) 10/18/2010  . Obstructive sleep apnea 10/18/2010  . On home oxygen therapy    "6L; 24/7" (04/10/2016)  . OSA on CPAP    "wear mask sometimes" (04/10/2016)  . Paranoid ([place])    "sometimes; I'm on RX for it" (04/10/2016)  . Pneumonia    "I've had it several times; haven't had it since 06/2015" (04/10/2016)  . Prolonged Q-T interval on ECG   . QT prolongation   . Rectal bleeding 12/31/2015  . Right carotid bruit 11/09/2016  . Right foot pain 11/15/2016  . Scabies 06/01/2015  . Schizoaffective disorder, bipolar type ([place]) 04/05/2018  . Seasonal allergies   . Seborrheic keratoses 12/31/2013  . Seizure ([place]) 01/04/2013  . Seizures ([place])    "don't know what kind; last one was ~ 1 yr ago" (04/10/2016)  . Sinus trouble   . Skin irritation 04/07/2012  . SOB (shortness of breath) 04/10/2016  . Stroke [facility]) 1980s   denies residual on 04/10/2016  . Syncope 01/06/2017  . Thrush 09/19/2013  . Tobacco use disorder 07/22/2014  . Type 2 diabetes mellitus ([place]) 10/18/2010  . Type II diabetes mellitus ([place])   . Upper abdominal pain   . Vaginal discharge 01/03/2018  . Wheezing 04/12/2016  . Wound, breast 01/03/2018    Past Surgical History:  Procedure Laterality Date  . CESAREAN SECTION  1997  . HERNIA REPAIR    . IR CHOLANGIOGRAM EXISTING TUBE  07/20/2016  . IR PERC CHOLECYSTOSTOMY  05/10/2016  . IR  RADIOLOGIST EVAL & MGMT  06/08/2016  . IR RADIOLOGIST EVAL & MGMT  06/29/2016  . IR SINUS/FIST TUBE CHK-NON GI  07/12/2016  . RIGHT/LEFT HEART CATH AND CORONARY ANGIOGRAPHY N/A 06/19/2017   Procedure: RIGHT/LEFT HEART CATH AND CORONARY ANGIOGRAPHY;  Surgeon: Jolaine Artist, MD;  Location: [facility];  Service: Cardiovascular;  Laterality: N/A;  . TIBIA IM NAIL INSERTION Right 07/12/2016   Procedure: INTRAMEDULLARY (IM) NAIL RIGHT TIBIA;  Surgeon: Leandrew Koyanagi, MD;  Location: [facility];  Service: Orthopedics;  Laterality: Right;  . UMBILICAL HERNIA REPAIR  ~ 1963   "that's why I don't have a belly button"  . VAGINAL HYSTERECTOMY      Family Psychiatric History: Reviewed.  Family History:  Family History  Problem Relation Age of Onset  . Cancer Father        prostate  . Cancer Mother  lung  . Depression Mother   . Depression Sister   . Anxiety disorder Sister   . Schizophrenia Sister   . Bipolar disorder Sister   . Depression Sister   . Depression Brother   . Heart failure Other        cousin    Social History:  Social History   Socioeconomic History  . Marital status: Widowed    Spouse name: Not on file  . Number of children: 3  . Years of education: Not on file  . Highest education level: Not on file  Occupational History  . 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 Financial resource strain: Not on file  . Food insecurity    Worry: Sometimes true    Inability: Never true  . Transportation needs    Medical: No    Non-medical: No  Tobacco Use  . Smoking status: Former Smoker    Packs/day: 1.50    Years: 38.00    Pack years: 57.00    Types: Cigarettes    Start date: 03/13/1977    Quit date: 04/10/2016    Years since quitting: 2.6  . Smokeless tobacco: Never Used  Substance and Sexual Activity  . Alcohol use: No    Alcohol/week: 0.0 standard drinks  . Drug use: No    Types: Cocaine    Comment: 04/10/2016 "last used  cocaine back in November 2017"  . Sexual activity: Not Currently    Birth control/protection: Surgical  Lifestyle  . Physical activity    Days per week: Not on file    Minutes per session: Not on file  . Stress: Not on file  Relationships  . Social Herbalist on phone: Not on file    Gets together: Not on file    Attends religious service: Not on file    Active member of club or organization: Not on file    Attends meetings of clubs or organizations: Not on file    Relationship status: Not on file  Other Topics Concern  . Not on file  Social History Narrative   Has 1 son, [name]   Lives with son and his boyfriend   Her house has ramps and handrails should she ever needs them.    Her mother lives down the street from her and is a good support person in addition to her son.   She drives herself, has private transportation.    Cocaine free since 02/24/16, smoke free since 04/10/16    Allergies:  Allergies  Allergen Reactions  . Hydrocodone Shortness Of Breath  . Hydrocodone-Acetaminophen Shortness Of Breath  . Hydroxyzine Anaphylaxis and Shortness Of Breath  . Latuda [Lurasidone Hcl] Anaphylaxis  . Lurasidone Anaphylaxis  . Magnesium-Containing Compounds Anaphylaxis  . Prednisone Anaphylaxis, Swelling and Other (See Comments)    Tongue swelling  . Tramadol Anaphylaxis and Swelling  . Codeine Nausea And Vomiting  . Other Rash  . Sulfa Antibiotics Itching  . Tape Rash    Metabolic Disorder Labs: Lab Results  Component Value Date   HGBA1C 5.6 11/08/2018   MPG 125.5 01/07/2017   MPG 111.15 11/03/2016   No results found for: PROLACTIN Lab Results  Component Value Date   CHOL 127 08/05/2018   TRIG 53 08/05/2018   HDL 62 08/05/2018   CHOLHDL 2.0 08/05/2018   VLDL 38 01/07/2017   LDLCALC 54 08/05/2018   LDLCALC 56 01/07/2017   Lab Results  Component Value Date  TSH 0.563 03/15/2016   TSH 1.060 12/04/2015    Therapeutic Level Labs: No results found  for: LITHIUM Lab Results  Component Value Date   VALPROATE 20 (L) 09/09/2016   VALPROATE 46 (L) 07/10/2016   No components found for:  CBMZ  Current Medications: Current Outpatient Medications  Medication Sig Dispense Refill  . albuterol (PROVENTIL) (2.5 MG/3ML) 0.083% nebulizer solution Take 3 mLs (2.5 mg total) by nebulization every 6 (six) hours as needed for wheezing or shortness of breath. 150 mL 1  . albuterol (VENTOLIN HFA) 108 (90 Base) MCG/ACT inhaler Inhale 2 puffs into the lungs every 6 (six) hours as needed for wheezing or shortness of breath. 18 g 2  . amLODipine (NORVASC) 5 MG tablet TAKE 1 TABLET (5 MG TOTAL) BY MOUTH EVERY MORNING 90 tablet 3  . Asenapine Maleate 10 MG SUBL Place 1 tablet (10 mg total) under the tongue 2 (two) times daily. 60 tablet 1  . aspirin (GOODSENSE ASPIRIN LOW DOSE) 81 MG EC tablet TAKE 1 TABLET (81 MG TOTAL) BY MOUTH DAILY (MORNING). (Patient taking differently: Take 81 mg by mouth daily. ) 90 tablet 3  . atorvastatin (LIPITOR) 80 MG tablet Take 1 tablet (80 mg total) by mouth daily. 90 tablet 0  . budesonide-formoterol (SYMBICORT) 80-4.5 MCG/ACT inhaler Inhale 2 puffs into the lungs 2 (two) times daily. 1 Inhaler 2  . colchicine (COLCRYS) 0.6 MG tablet TAKE 1 TABLET (0.6 MG TOTAL) BY MOUTH 2 (TWO) TIMES DAILY. 180 tablet 3  . cyclobenzaprine (FLEXERIL) 5 MG tablet TAKE 1 TABLET BY MOUTH THREE TIMES A DAY AS NEEDED FOR MUSCLE SPASMS 60 tablet 0  . diazepam (VALIUM) 10 MG tablet Take 1 tablet (10 mg total) by mouth 2 (two) times daily. 60 tablet 2  . diclofenac (VOLTAREN) 75 MG EC tablet Take 1 tablet (75 mg total) by mouth 2 (two) times daily. 180 tablet 0  . diclofenac sodium (VOLTAREN) 1 % GEL Apply 2 g topically 4 (four) times daily. 100 g 0  . docusate sodium (COLACE) 100 MG capsule Take 1 capsule (100 mg total) by mouth daily as needed for mild constipation or moderate constipation. 10 capsule 2  . doxazosin (CARDURA) 2 MG tablet Take 1 tablet (2  mg total) by mouth daily. 30 tablet 0  . escitalopram (LEXAPRO) 20 MG tablet Take 2 tablets (40 mg total) by mouth daily. 180 tablet 0  . febuxostat (ULORIC) 40 MG tablet Take 40 mg by mouth daily.     . ferrous sulfate 325 (65 FE) MG tablet Take 1 tablet (325 mg total) by mouth daily with breakfast. 90 tablet 3  . flunisolide (NASALIDE) 25 MCG/ACT (0.025%) SOLN Place 2 sprays into the nose daily. 1 Bottle 2  . Fluticasone-Umeclidin-Vilant (TRELEGY ELLIPTA) 100-62.5-25 MCG/INH AEPB Inhale 1 puff into the lungs daily. 60 each 1  . gabapentin (NEURONTIN) 600 MG tablet Take 1 tablet (600 mg total) by mouth 2 (two) times daily. 180 tablet 1  . lisdexamfetamine (VYVANSE) 70 MG capsule Take 1 capsule (70 mg total) by mouth daily. 30 capsule 0  . metolazone (ZAROXOLYN) 2.5 MG tablet Take 1 tablet (2.5 mg total) by mouth daily. (Patient taking differently: Take 2.5 mg by mouth 3 (three) times a week. ) 90 tablet 0  . miconazole (MICONAZOLE 7) 2 % vaginal cream Place 1 Applicatorful vaginally at bedtime. 45 g 0  . montelukast (SINGULAIR) 10 MG tablet TAKE 1 TABLET BY MOUTH EVERYDAY AT BEDTIME 90 tablet 0  .  Multiple Vitamin (MULTIVITAMIN WITH MINERALS) TABS tablet Take 1 tablet by mouth daily.    [name] omeprazole (PRILOSEC) 40 MG capsule Take 1 capsule (40 mg total) by mouth 2 (two) times daily. 180 capsule 3  . oxybutynin (DITROPAN-XL) 10 MG 24 hr tablet Take 1-2 tablets (10-20 mg total) by mouth at bedtime. 60 tablet 1  . polyethylene glycol (MIRALAX / GLYCOLAX) 17 g packet Take 17 g by mouth daily as needed for mild constipation or moderate constipation. 14 each 11  . potassium chloride (K-DUR) 10 MEQ tablet Take 10 mEq by mouth 2 (two) times daily.    . prazosin (MINIPRESS) 2 MG capsule Take 1 capsule (2 mg total) by mouth at bedtime. 30 capsule 2  . tacrolimus (PROTOPIC) 0.03 % ointment Apply topically 2 (two) times daily. 100 g 0  . terconazole (TERAZOL 3) 0.8 % vaginal cream Apply pea sized amount to  clitoris nightly. 20 g 0  . torsemide (DEMADEX) 20 MG tablet Take 4 tablets (80 mg total) by mouth 2 (two) times daily. 240 tablet 4  . traZODone (DESYREL) 100 MG tablet Take 1 tablet (100 mg total) by mouth at bedtime. 90 tablet 0  . traZODone (DESYREL) 50 MG tablet Take 1 tablet (50 mg total) by mouth daily. 90 tablet 0  . zinc oxide 20 % ointment Apply 1 application topically as needed for irritation. 56.7 g 5   No current facility-administered medications for this visit.      Psychiatric Specialty Exam: Review of Systems  Musculoskeletal: Positive for myalgias.  Psychiatric/Behavioral: Positive for depression. The patient is nervous/anxious.   All other systems reviewed and are negative.   There were no vitals taken for this visit.There is no height or weight on file to calculate BMI.  General Appearance: NA  Eye Contact:  NA  Speech:  Garbled and Normal Rate  Volume:  Normal  Mood:  Anxious and Depressed  Affect:  NA  Thought Process:  Descriptions of Associations: Circumstantial  Orientation:  Full (Time, Place, and Person)  Thought Content: Paranoid Ideation and Rumination   Suicidal Thoughts:  No  Homicidal Thoughts:  No  Memory:  Immediate;   Fair Recent;   Fair Remote;   Good  Judgement:  Fair  Insight:  Fair  Psychomotor Activity:  NA  Concentration:  Concentration: Fair  Recall:  [place] of Knowledge: Fair  Language: Fair  Akathisia:  Negative  Handed:  Right  AIMS (if indicated): not done  Assets:  Desire for Improvement Housing Resilience  ADL's:  Intact  Cognition: WNL  Sleep:  Fair   Screenings: AUDIT     Admission (Discharged) from 12/01/2015 in [name]  Alcohol Use Disorder Identification Test Final Score (AUDIT)  0    GAD-7     Office Visit from 03/21/2018 in [facility] for [facility]  Total GAD-7 Score  4    PHQ2-9     Office Visit from 12/11/2018 in [facility] from  12/10/2018 in [facility] at [facility] from 11/18/2018 in [facility] from 11/08/2018 in [facility] from 10/25/2018 in [facility] at [facility]  PHQ-2 Total Score  0  4  2  1  4   PHQ-9 Total Score  -  10  8  1  16        Assessment and Plan: 58yo divorced AAF with schizoaffective disorder bipolar type(vs paranoid schizophrenia),chronicPTSD,remote hx ofcocaineaddiction (clean  for 4 years)and bingeeating disorder.She tried ziprazidone but developedQTC prolongation on itso it was changed toSaphriswhich she believes is a best medication she has ever been on. [name] also on escitalopram and was on Vyvanse for binge eating disorder. Tiffany [name] used to weigh over 300 lbs and since starting Vyvanse her binge eating is well controlled and she was able to lose a lot of weight). She continues to take diazepam 10 mg bid (was on tid earlier) and trazodone for sleep (100 mg plus 50 mg in am "for nerves").She has a strained relationship with her family - mother in particular. Continues toaccuse mother of going though her belongings, stealing from her and she has called police on her few times. She feels depressed about this situation, feel abandoned and feels that family does not "want me to succeed but I will". Tiffany [name] denies feeling suicidal, no hallucinations reported.She reports being now "in a safe place" but still expresses paranoid fears about her family: [name] compliant with her medications, does not abuse street drugs. It was a difficult months for her: first because of increased paranoia she was in a geropsychiatry unit in [place] where her Saphris dose was increased while diazepam decreased. [name] now back with her mother and relationship appears to be better. Tiffany [name] still is paranoid at baseline and ruminates on past misdeeds of different people towards her:"They are stealing from me".  She continues counseling with Lise Auer but wants to  look for a new therapist who will be able to see her twice weekly. [name]  Dx: Schizoaffective disorder bipolar type; PTSD chronic  Plan: Continue current meds: Lexapro (increase to previous dose of 40 mg), trazodone, Saphris, prazosin (increase to 2 mg at HS) , diazepam and Vyvanse. I will [facility] them [facility]. Next appointment inthree weeks.[name] also asking for a letter to court attesting that she is compliant with medications while she feels unsupported by family. The plan was discussed with patient who had an opportunity to ask questions and these were all answered.Supportive psychotherapy was also provided.I spend69minutes in phone consultation with the patient.    Stephanie Acre, MD 12/17/2018, 4:53 PM

## 2018-12-18 ENCOUNTER — Telehealth (HOSPITAL_COMMUNITY): Payer: Self-pay

## 2018-12-18 ENCOUNTER — Other Ambulatory Visit: Payer: Self-pay | Admitting: Registered Nurse

## 2018-12-18 DIAGNOSIS — R0789 Other chest pain: Secondary | ICD-10-CM

## 2018-12-18 NOTE — Telephone Encounter (Signed)
Medication management - Prior Authorization for patient's Vyvanse 70mg  Capsules submitted online with CoverMyMeds based on Diagnosis F50.81 Binge Eating Disorder.  Contraindications to take with Albuterol and Escitalopram for review.

## 2018-12-18 NOTE — Telephone Encounter (Signed)
Medication management - Prior authorization for pt's prescribed Escitalopram submitted online with CoverMyMeds for a 90 day supply of 20 mg, two a day.  Pending decision.

## 2018-12-20 NOTE — Telephone Encounter (Signed)
Medication management - Faxes received from [facility] verifying approval for patient's prescribed Vyvanse 70 Capsules and Escitalopram 20 mg tabalets both through until 01/16/2020. Called patient's [facility] on [address] to inform of approvals for both medications from [place] and pharmacist verified medications were able to be filled.

## 2018-12-23 ENCOUNTER — Ambulatory Visit (HOSPITAL_COMMUNITY): Payer: Medicare HMO | Admitting: Psychiatry

## 2018-12-23 ENCOUNTER — Other Ambulatory Visit: Payer: Self-pay

## 2018-12-24 DIAGNOSIS — J449 Chronic obstructive pulmonary disease, unspecified: Secondary | ICD-10-CM | POA: Diagnosis not present

## 2018-12-25 ENCOUNTER — Other Ambulatory Visit: Payer: Self-pay

## 2018-12-25 ENCOUNTER — Ambulatory Visit (HOSPITAL_COMMUNITY): Payer: Medicare HMO | Admitting: Psychiatry

## 2018-12-26 ENCOUNTER — Telehealth: Payer: Self-pay | Admitting: Emergency Medicine

## 2018-12-26 ENCOUNTER — Ambulatory Visit: Payer: Medicare HMO | Admitting: Emergency Medicine

## 2018-12-26 DIAGNOSIS — J42 Unspecified chronic bronchitis: Secondary | ICD-10-CM

## 2018-12-26 NOTE — Telephone Encounter (Signed)
ATC pt, no answer. Left message for pt to call back.     Follow Up Instructions:  Return in about 6 weeks (around 09/24/2018), or if symptoms worsen or fail to improve, for Follow up with Dr. Lamonte Sakai, Follow up for [facility] [place] with [facility], Follow up for PFT.  I discussed the assessment and treatment plan with the patient. The patient was provided an opportunity to ask questions and all were answered. The patient agreed with the plan and demonstrated an understanding of the instructions.  The patient was advised to call back or seek an in-person evaluation if the symptoms worsen or if the condition fails to improve as anticipated.  I provided 24 minutes of non-face-to-face time during this encounter.   Lauraine Rinne, NP

## 2018-12-27 MED ORDER — TRELEGY ELLIPTA 100-62.5-25 MCG/INH IN AEPB: 1.0000 | INHALATION_SPRAY | Freq: Every day | RESPIRATORY_TRACT | 1 refills | Status: DC

## 2018-12-27 MED ORDER — ALBUTEROL SULFATE HFA 108 (90 BASE) MCG/ACT IN AERS: 2.0000 | INHALATION_SPRAY | Freq: Four times a day (QID) | RESPIRATORY_TRACT | 2 refills | Status: DC | PRN

## 2018-12-27 NOTE — Telephone Encounter (Addendum)
Refill of meds sent to pt's preferred pharmacy. Attempted to call pt to let her know this was done but unable to reach. Left pt a detailed message letting her know this was done.

## 2018-12-27 NOTE — Telephone Encounter (Signed)
LMTCB x2 for pt 

## 2018-12-27 NOTE — Telephone Encounter (Signed)
Pt returning call.  [phone]

## 2018-12-30 ENCOUNTER — Telehealth (HOSPITAL_COMMUNITY): Payer: Self-pay

## 2018-12-30 ENCOUNTER — Other Ambulatory Visit (HOSPITAL_COMMUNITY): Payer: Self-pay | Admitting: Psychiatry

## 2018-12-30 DIAGNOSIS — J9611 Chronic respiratory failure with hypoxia: Secondary | ICD-10-CM | POA: Diagnosis not present

## 2018-12-30 DIAGNOSIS — J9612 Chronic respiratory failure with hypercapnia: Secondary | ICD-10-CM | POA: Diagnosis not present

## 2018-12-30 DIAGNOSIS — J449 Chronic obstructive pulmonary disease, unspecified: Secondary | ICD-10-CM | POA: Diagnosis not present

## 2018-12-30 MED ORDER — ASENAPINE MALEATE 10 MG SL SUBL: 10.0000 mg | SUBLINGUAL_TABLET | Freq: Two times a day (BID) | SUBLINGUAL | 1 refills | Status: DC

## 2018-12-30 NOTE — Telephone Encounter (Signed)
Patient called requesting a refill on Saphris 10mg  but I don't see it in her medications. Is she taking this nmedication? [facility] [address] [facility] [place]. Please review and advise. Thank you.

## 2018-12-30 NOTE — Telephone Encounter (Signed)
It is there (asenapine) - it went to [facility] as [facility]? Go figure.

## 2018-12-31 ENCOUNTER — Encounter (HOSPITAL_COMMUNITY): Payer: Self-pay

## 2018-12-31 ENCOUNTER — Telehealth (HOSPITAL_COMMUNITY): Payer: Self-pay

## 2019-01-01 ENCOUNTER — Telehealth: Payer: Self-pay | Admitting: Internal Medicine

## 2019-01-01 DIAGNOSIS — Z515 Encounter for palliative care: Secondary | ICD-10-CM

## 2019-01-01 NOTE — Telephone Encounter (Signed)
Returned call to patient who is inquiring about potential locations for long term housing and medical providers.  Community resources provided inclusive of Solicitor, social service of [place].  Plan on re-referral to palliative social worker for further discussion.  Follow-up as needed.    Gonzella Lex, NP-C

## 2019-01-02 ENCOUNTER — Other Ambulatory Visit (HOSPITAL_COMMUNITY): Payer: Self-pay | Admitting: Psychiatry

## 2019-01-03 ENCOUNTER — Other Ambulatory Visit: Payer: Medicare HMO | Admitting: Internal Medicine

## 2019-01-03 ENCOUNTER — Other Ambulatory Visit: Payer: Self-pay | Admitting: Registered Nurse

## 2019-01-03 DIAGNOSIS — N3281 Overactive bladder: Secondary | ICD-10-CM

## 2019-01-03 DIAGNOSIS — I5032 Chronic diastolic (congestive) heart failure: Secondary | ICD-10-CM

## 2019-01-03 DIAGNOSIS — M109 Gout, unspecified: Secondary | ICD-10-CM

## 2019-01-03 MED ORDER — FEBUXOSTAT 40 MG PO TABS: 40.0000 mg | ORAL_TABLET | Freq: Every day | ORAL | 0 refills | Status: DC

## 2019-01-03 MED ORDER — AMLODIPINE BESYLATE 5 MG PO TABS: ORAL_TABLET | ORAL | 3 refills | Status: DC

## 2019-01-03 MED ORDER — COLCHICINE 0.6 MG PO TABS: ORAL_TABLET | ORAL | 3 refills | Status: DC

## 2019-01-03 NOTE — Progress Notes (Signed)
Courtesy refills given on request by patient's palliative care provider, Gonzella Lex, NP  Kathrin Ruddy, NP

## 2019-01-06 ENCOUNTER — Ambulatory Visit (INDEPENDENT_AMBULATORY_CARE_PROVIDER_SITE_OTHER): Payer: Medicare HMO | Admitting: Psychiatry

## 2019-01-06 ENCOUNTER — Other Ambulatory Visit: Payer: Self-pay

## 2019-01-06 DIAGNOSIS — I5032 Chronic diastolic (congestive) heart failure: Secondary | ICD-10-CM | POA: Diagnosis not present

## 2019-01-06 DIAGNOSIS — F5081 Binge eating disorder: Secondary | ICD-10-CM

## 2019-01-06 DIAGNOSIS — F250 Schizoaffective disorder, bipolar type: Secondary | ICD-10-CM

## 2019-01-06 DIAGNOSIS — F4312 Post-traumatic stress disorder, chronic: Secondary | ICD-10-CM | POA: Diagnosis not present

## 2019-01-06 MED ORDER — PRAZOSIN HCL 1 MG PO CAPS: 1.0000 mg | ORAL_CAPSULE | Freq: Three times a day (TID) | ORAL | 0 refills | Status: DC

## 2019-01-06 MED ORDER — DIAZEPAM 10 MG PO TABS: 10.0000 mg | ORAL_TABLET | Freq: Two times a day (BID) | ORAL | 2 refills | Status: DC

## 2019-01-06 MED ORDER — TRAZODONE HCL 50 MG PO TABS: 50.0000 mg | ORAL_TABLET | Freq: Every day | ORAL | 0 refills | Status: DC

## 2019-01-06 MED ORDER — ASENAPINE MALEATE 10 MG SL SUBL: 10.0000 mg | SUBLINGUAL_TABLET | Freq: Two times a day (BID) | SUBLINGUAL | 0 refills | Status: DC

## 2019-01-06 MED ORDER — TRAZODONE HCL 100 MG PO TABS: 100.0000 mg | ORAL_TABLET | Freq: Every day | ORAL | 0 refills | Status: DC

## 2019-01-06 MED ORDER — ESCITALOPRAM OXALATE 20 MG PO TABS: 40.0000 mg | ORAL_TABLET | Freq: Every day | ORAL | 0 refills | Status: DC

## 2019-01-06 MED ORDER — LISDEXAMFETAMINE DIMESYLATE 70 MG PO CAPS: 70.0000 mg | ORAL_CAPSULE | Freq: Every day | ORAL | 0 refills | Status: DC

## 2019-01-06 NOTE — Progress Notes (Signed)
BH MD/PA/NP OP Progress Note  01/06/2019 3:58 PM SEBA MADOLE  MRN:  [id number] Interview was conducted by phone and I verified that I was speaking with the correct person using two identifiers. I discussed the limitations of evaluation and management by telemedicine and  the availability of in person appointments. Patient expressed understanding and agreed to proceed.  Chief Complaint: anxiety,paranoia about family.  HPI: 58yo divorced AAF with schizoaffective disorder bipolar type(vs paranoid schizophrenia),chronicPTSD,remote hx ofcocaineaddiction (clean for 4 years)and bingeeating disorder.She tried ziprazidone but developedQTC prolongation on itso it was changed toSaphriswhich she believes is a best medication she has ever been on. [name] also on escitalopram andwas onVyvanse for binge eating disorder. Thurza used to weigh over 300 lbs and since starting Vyvanse her binge eating is well controlled and she was able to lose a lot of weight). She continues to take diazepam 10 mg bid (was on tid earlier) and trazodone for sleep (100 mg plus 50 mg in am "for nerves").She has a strained relationship with her family - mother in particular. Continues toaccuse mother of going though her belongings, stealing from her and she has called police on her few times. Oneita denies feeling suicidal, no hallucinations reported.She reports being now "in a safe place" but still expresses paranoid fears about her family: [name] compliant with her medications, does not abuse street drugs.It was a difficult months for her: first because of increased paranoia she was in a geropsychiatry unit in [place] where her Saphris dose was increased while diazepam decreased. [name] now back with her mother and relationship appears to be better. Deadra still is paranoid at baseline and ruminates on past misdeeds of different people from her family (mostly her mother) towards her. [name] now at a safe house in  [place]. Feels safe there at this time.   Visit Diagnosis:    ICD-10-CM   1. Schizoaffective disorder, bipolar type ([place])  F25.0   2. Binge eating disorder  F50.81   3. Chronic post-traumatic stress disorder (PTSD)  F43.12   4. Chronic diastolic congestive heart failure (HCC)  I50.32 prazosin (MINIPRESS) 1 MG capsule    Past Psychiatric History: Please see intake H&P.  Past Medical History:  Past Medical History:  Diagnosis Date  . Agitation 11/22/2017  . AKI (acute kidney injury) ([place]) 11/21/2017  . Anemia 04/24/2011  . Anoxic brain injury ([place]) 09/08/2016   C. Arrest due to respiratory failure and COPD exacerbation  . Anxiety   . Arthritis    "all over" (04/10/2016)  . Asthma 10/18/2010  . Binge eating disorder   . Cardiac arrest ([place]) 09/08/2016   PEA  . Carotid artery stenosis    1-39% bilateral by dopplers 11/2016  . Chronic bronchitis ([place])   . Chronic diastolic (congestive) heart failure ([place])   . Chronic kidney disease    "I see a kidney dr." (04/10/2016)  . Chronic pain syndrome 06/18/2012  . Chronic post-traumatic stress disorder (PTSD) 05/27/2018  . Chronic respiratory failure with hypoxia and hypercapnia (HCC) 06/22/2015   TRILOGY Vent >AVAPA-ES., Vt target 200-400, Max P 30 , PS max 20 , PS min 6-10 , E Max 6, E Min 4, Rate Auto AVAPS Rate 2 (titrate for pt comfort) , bleed O2 at 5l/m continuous flow .   [name] CKD (chronic kidney disease) stage 3, GFR 30-59 ml/min 12/15/2016  . Closed displaced fracture of fifth metacarpal bone 03/21/2018  . Cocaine use disorder, severe, in sustained remission ([place]) 12/17/2015  . Complication  of anesthesia    decreased bp, decreased heart rate  . Constipation   . COPD (chronic obstructive pulmonary disease) ([place]) 07/08/2014  . Depression   . Diabetic neuropathy ([place]) 04/24/2011  . Difficulty with speech 01/24/2018  . Disorder of nervous system   . Drug abuse ([place]) 11/21/2017  . Dyslipidemia 04/24/2011  . Dyspnea   . Elevated troponin  04/28/2012  . Emphysema   . Encephalopathy 11/21/2017  . Essential hypertension 03/22/2016  . Fall   . Fibula fracture 07/10/2016  . Frequent falls 10/11/2017  . GERD (gastroesophageal reflux disease)   . Gout 04/11/2017  . Heart attack ([place]) 1980s  . History of blood transfusion 1994   "couldn't stop bleeding from my period"  . History of drug abuse in remission ([place]) 11/28/2015   Quit in 2017  . Hyperlipidemia LDL goal <70   . Hypertension   . Incontinence   . Manic depression ([place])   . Morbid obesity ([place]) 10/18/2010  . Obstructive sleep apnea 10/18/2010  . On home oxygen therapy    "6L; 24/7" (04/10/2016)  . OSA on CPAP    "wear mask sometimes" (04/10/2016)  . Paranoid ([employer])    "sometimes; I'm on RX for it" (04/10/2016)  . Pneumonia    "I've had it several times; haven't had it since 06/2015" (04/10/2016)  . Prolonged Q-T interval on ECG   . QT prolongation   . Rectal bleeding 12/31/2015  . Right carotid bruit 11/09/2016  . Right foot pain 11/15/2016  . Scabies 06/01/2015  . Schizoaffective disorder, bipolar type ([place]) 04/05/2018  . Seasonal allergies   . Seborrheic keratoses 12/31/2013  . Seizure ([place]) 01/04/2013  . Seizures ([place])    "don't know what kind; last one was ~ 1 yr ago" (04/10/2016)  . Sinus trouble   . Skin irritation 04/07/2012  . SOB (shortness of breath) 04/10/2016  . Stroke [facility]) 1980s   denies residual on 04/10/2016  . Syncope 01/06/2017  . Thrush 09/19/2013  . Tobacco use disorder 07/22/2014  . Type 2 diabetes mellitus ([place]) 10/18/2010  . Type II diabetes mellitus ([place])   . Upper abdominal pain   . Vaginal discharge 01/03/2018  . Wheezing 04/12/2016  . Wound, breast 01/03/2018    Past Surgical History:  Procedure Laterality Date  . CESAREAN SECTION  1997  . HERNIA REPAIR    . IR CHOLANGIOGRAM EXISTING TUBE  07/20/2016  . IR PERC CHOLECYSTOSTOMY  05/10/2016  . IR RADIOLOGIST EVAL & MGMT  06/08/2016  . IR RADIOLOGIST EVAL & MGMT  06/29/2016  . IR SINUS/FIST TUBE  CHK-NON GI  07/12/2016  . RIGHT/LEFT HEART CATH AND CORONARY ANGIOGRAPHY N/A 06/19/2017   Procedure: RIGHT/LEFT HEART CATH AND CORONARY ANGIOGRAPHY;  Surgeon: Jolaine Artist, MD;  Location: [facility];  Service: Cardiovascular;  Laterality: N/A;  . TIBIA IM NAIL INSERTION Right 07/12/2016   Procedure: INTRAMEDULLARY (IM) NAIL RIGHT TIBIA;  Surgeon: Leandrew Koyanagi, MD;  Location: [facility];  Service: Orthopedics;  Laterality: Right;  . UMBILICAL HERNIA REPAIR  ~ 1963   "that's why I don't have a belly button"  . VAGINAL HYSTERECTOMY      Family Psychiatric History: Reviewed.  Family History:  Family History  Problem Relation Age of Onset  . Cancer Father        prostate  . Cancer Mother        lung  . Depression Mother   . Depression Sister   . Anxiety disorder Sister   .  Schizophrenia Sister   . Bipolar disorder Sister   . Depression Sister   . Depression Brother   . Heart failure Other        cousin    Social History:  Social History   Socioeconomic History  . Marital status: Widowed    Spouse name: Not on file  . Number of children: 3  . Years of education: Not on file  . Highest education level: Not on file  Occupational History  . Occupation: disabled    Comment: factory production  Tobacco Use  . Smoking status: Former Smoker    Packs/day: 1.50    Years: 38.00    Pack years: 57.00    Types: Cigarettes    Start date: 03/13/1977    Quit date: 04/10/2016    Years since quitting: 2.7  . Smokeless tobacco: Never Used  Substance and Sexual Activity  . Alcohol use: No    Alcohol/week: 0.0 standard drinks  . Drug use: No    Types: Cocaine    Comment: 04/10/2016 "last used cocaine back in November 2017"  . Sexual activity: Not Currently    Birth control/protection: Surgical  Other Topics Concern  . Not on file  Social History Narrative   Has 1 son, [name]   Lives with son and his boyfriend   Her house has ramps and handrails should she ever needs them.     Her mother lives down the street from her and is a good support person in addition to her son.   She drives herself, has private transportation.    Cocaine free since 02/24/16, smoke free since 04/10/16   Social Determinants of Health   Financial Resource Strain:   . Difficulty of Paying Living Expenses: Not on file  Food Insecurity: Food Insecurity Present  . Worried About Charity fundraiser in the Last Year: Sometimes true  . Ran Out of Food in the Last Year: Never true  Transportation Needs: No Transportation Needs  . Lack of Transportation (Medical): No  . Lack of Transportation (Non-Medical): No  Physical Activity:   . Days of Exercise per Week: Not on file  . Minutes of Exercise per Session: Not on file  Stress:   . Feeling of Stress : Not on file  Social Connections:   . Frequency of Communication with Friends and Family: Not on file  . Frequency of Social Gatherings with Friends and Family: Not on file  . Attends Religious Services: Not on file  . Active Member of Clubs or Organizations: Not on file  . Attends Archivist Meetings: Not on file  . Marital Status: Not on file    Allergies:  Allergies  Allergen Reactions  . Hydrocodone Shortness Of Breath  . Hydrocodone-Acetaminophen Shortness Of Breath  . Hydroxyzine Anaphylaxis and Shortness Of Breath  . Latuda [Lurasidone Hcl] Anaphylaxis  . Lurasidone Anaphylaxis  . Magnesium-Containing Compounds Anaphylaxis  . Prednisone Anaphylaxis, Swelling and Other (See Comments)    Tongue swelling  . Tramadol Anaphylaxis and Swelling  . Codeine Nausea And Vomiting  . Other Rash  . Sulfa Antibiotics Itching  . Tape Rash    Metabolic Disorder Labs: Lab Results  Component Value Date   HGBA1C 5.6 11/08/2018   MPG 125.5 01/07/2017   MPG 111.15 11/03/2016   No results found for: PROLACTIN Lab Results  Component Value Date   CHOL 127 08/05/2018   TRIG 53 08/05/2018   HDL 62 08/05/2018   CHOLHDL 2.0 08/05/2018  VLDL 38 01/07/2017   LDLCALC 54 08/05/2018   LDLCALC 56 01/07/2017   Lab Results  Component Value Date   TSH 0.563 03/15/2016   TSH 1.060 12/04/2015    Therapeutic Level Labs: No results found for: LITHIUM Lab Results  Component Value Date   VALPROATE 20 (L) 09/09/2016   VALPROATE 46 (L) 07/10/2016   No components found for:  CBMZ  Current Medications: Current Outpatient Medications  Medication Sig Dispense Refill  . albuterol (PROVENTIL) (2.5 MG/3ML) 0.083% nebulizer solution Take 3 mLs (2.5 mg total) by nebulization every 6 (six) hours as needed for wheezing or shortness of breath. 150 mL 1  . albuterol (VENTOLIN HFA) 108 (90 Base) MCG/ACT inhaler Inhale 2 puffs into the lungs every 6 (six) hours as needed for wheezing or shortness of breath. 18 g 2  . amLODipine (NORVASC) 5 MG tablet TAKE 1 TABLET (5 MG TOTAL) BY MOUTH EVERY MORNING 90 tablet 3  . Asenapine Maleate 10 MG SUBL Place 1 tablet (10 mg total) under the tongue 2 (two) times daily. 180 tablet 0  . aspirin (GOODSENSE ASPIRIN LOW DOSE) 81 MG EC tablet TAKE 1 TABLET (81 MG TOTAL) BY MOUTH DAILY (MORNING). (Patient taking differently: Take 81 mg by mouth daily. ) 90 tablet 3  . atorvastatin (LIPITOR) 80 MG tablet Take 1 tablet (80 mg total) by mouth daily. 90 tablet 0  . budesonide-formoterol (SYMBICORT) 80-4.5 MCG/ACT inhaler Inhale 2 puffs into the lungs 2 (two) times daily. 1 Inhaler 2  . colchicine (COLCRYS) 0.6 MG tablet TAKE 1 TABLET (0.6 MG TOTAL) BY MOUTH 2 (TWO) TIMES DAILY. 180 tablet 3  . cyclobenzaprine (FLEXERIL) 5 MG tablet TAKE 1 TABLET BY MOUTH THREE TIMES A DAY AS NEEDED FOR MUSCLE SPASMS 60 tablet 0  . diazepam (VALIUM) 10 MG tablet Take 1 tablet (10 mg total) by mouth 2 (two) times daily. 60 tablet 2  . diclofenac (VOLTAREN) 75 MG EC tablet Take 1 tablet (75 mg total) by mouth 2 (two) times daily. 180 tablet 0  . diclofenac sodium (VOLTAREN) 1 % GEL Apply 2 g topically 4 (four) times daily. 100 g 0  .  docusate sodium (COLACE) 100 MG capsule Take 1 capsule (100 mg total) by mouth daily as needed for mild constipation or moderate constipation. 10 capsule 2  . doxazosin (CARDURA) 2 MG tablet Take 1 tablet (2 mg total) by mouth daily. 30 tablet 0  . escitalopram (LEXAPRO) 20 MG tablet Take 2 tablets (40 mg total) by mouth daily. 180 tablet 0  . febuxostat (ULORIC) 40 MG tablet Take 1 tablet (40 mg total) by mouth daily. 90 tablet 0  . ferrous sulfate 325 (65 FE) MG tablet Take 1 tablet (325 mg total) by mouth daily with breakfast. 90 tablet 3  . flunisolide (NASALIDE) 25 MCG/ACT (0.025%) SOLN Place 2 sprays into the nose daily. 1 Bottle 2  . Fluticasone-Umeclidin-Vilant (TRELEGY ELLIPTA) 100-62.5-25 MCG/INH AEPB Inhale 1 puff into the lungs daily. 60 each 1  . gabapentin (NEURONTIN) 600 MG tablet Take 1 tablet (600 mg total) by mouth 2 (two) times daily. 180 tablet 1  . lisdexamfetamine (VYVANSE) 70 MG capsule Take 1 capsule (70 mg total) by mouth daily. 30 capsule 0  . metolazone (ZAROXOLYN) 2.5 MG tablet Take 1 tablet (2.5 mg total) by mouth daily. (Patient taking differently: Take 2.5 mg by mouth 3 (three) times a week. ) 90 tablet 0  . miconazole (MICONAZOLE 7) 2 % vaginal cream Place 1 Applicatorful vaginally  at bedtime. 45 g 0  . montelukast (SINGULAIR) 10 MG tablet TAKE 1 TABLET BY MOUTH EVERYDAY AT BEDTIME 90 tablet 0  . Multiple Vitamin (MULTIVITAMIN WITH MINERALS) TABS tablet Take 1 tablet by mouth daily.    [name] omeprazole (PRILOSEC) 40 MG capsule Take 1 capsule (40 mg total) by mouth 2 (two) times daily. 180 capsule 3  . oxybutynin (DITROPAN-XL) 10 MG 24 hr tablet Take 1-2 tablets (10-20 mg total) by mouth at bedtime. 60 tablet 1  . polyethylene glycol (MIRALAX / GLYCOLAX) 17 g packet Take 17 g by mouth daily as needed for mild constipation or moderate constipation. 14 each 11  . potassium chloride (K-DUR) 10 MEQ tablet Take 10 mEq by mouth 2 (two) times daily.    . prazosin (MINIPRESS) 1 MG  capsule Take 1 capsule (1 mg total) by mouth 3 (three) times daily. 270 capsule 0  . tacrolimus (PROTOPIC) 0.03 % ointment Apply topically 2 (two) times daily. 100 g 0  . terconazole (TERAZOL 3) 0.8 % vaginal cream Apply pea sized amount to clitoris nightly. 20 g 0  . torsemide (DEMADEX) 20 MG tablet Take 4 tablets (80 mg total) by mouth 2 (two) times daily. 240 tablet 4  . traZODone (DESYREL) 100 MG tablet Take 1 tablet (100 mg total) by mouth at bedtime. 90 tablet 0  . traZODone (DESYREL) 50 MG tablet Take 1 tablet (50 mg total) by mouth daily. 90 tablet 0  . zinc oxide 20 % ointment Apply 1 application topically as needed for irritation. 56.7 g 5   No current facility-administered medications for this visit.     Psychiatric Specialty Exam: Review of Systems  Psychiatric/Behavioral: Positive for dysphoric mood.  All other systems reviewed and are negative.   There were no vitals taken for this visit.There is no height or weight on file to calculate BMI.  General Appearance: NA  Eye Contact:  NA  Speech:  Rambling  Volume:  Normal  Mood:  Irritable  Affect:  NA  Thought Process:  Descriptions of Associations: Circumstantial  Orientation:  Full (Time, Place, and Person)  Thought Content: Paranoid Ideation   Suicidal Thoughts:  No  Homicidal Thoughts:  No  Memory:  Immediate;   Fair Recent;   Good Remote;   Good  Judgement:  Fair  Insight:  Fair  Psychomotor Activity:  NA  Concentration:  Concentration: Fair  Recall:  [place] of Knowledge: Fair  Language: Fair  Akathisia:  Negative  Handed:  Right  AIMS (if indicated): not done  Assets:  Desire for Improvement Housing Resilience  ADL's:  Intact  Cognition: WNL  Sleep:  Fair   Screenings: AUDIT     Admission (Discharged) from 12/01/2015 in [facility]  Alcohol Use Disorder Identification Test Final Score (AUDIT)  0    GAD-7     Office Visit from 03/21/2018 in [facility] for [facility]  Total GAD-7 Score  4    PHQ2-9     Office Visit from 12/11/2018 in [facility] from 12/10/2018 in [facility] from 11/18/2018 in [facility] from 11/08/2018 in [facility] from 10/25/2018 in [facility] at [facility] Total Score  0  4  2  1  4   PHQ-9 Total Score  --  10  8  1  16        Assessment and Plan: 58yo divorced AAF  with schizoaffective disorder bipolar type(vs paranoid schizophrenia),chronicPTSD,remote hx ofcocaineaddiction (clean for 4 years)and bingeeating disorder.She tried ziprazidone but developedQTC prolongation on itso it was changed toSaphriswhich she believes is a best medication she has ever been on. [name] also on escitalopram andwas onVyvanse for binge eating disorder. Tyesha used to weigh over 300 lbs and since starting Vyvanse her binge eating is well controlled and she was able to lose a lot of weight). She continues to take diazepam 10 mg bid (was on tid earlier) and trazodone for sleep (100 mg plus 50 mg in am "for nerves").She has a strained relationship with her family - mother in particular. Continues toaccuse mother of going though her belongings, stealing from her and she has called police on her few times. Matisha denies feeling suicidal, no hallucinations reported.She reports being now "in a safe place" but still expresses paranoid fears about her family: [name] compliant with her medications, does not abuse street drugs.It was a difficult months for her: first because of increased paranoia she was in a geropsychiatry unit in [place] where her Saphris dose was increased while diazepam decreased. [name] now back with her mother and relationship appears to be better. Alvera still is paranoid at baseline and ruminates on past misdeeds of different people from her family (mostly her mother) towards her. [name] now at a safe house in  [place]. Feels safe there at this time.   Dx: Schizoaffective disorder bipolar type; PTSD chronic  Plan: Continue current meds: Lexapro (increase to previous dose of 40 mg), trazodone, Saphris,prazosin (increase to 1 mg tid) ,diazepam andVyvanse. I will [facility] them to [facility] [place]. Next appointment inthree weeks.[name] also asking for a therapist with whom she can talk at least every two weeks (would prefer weekly). The plan was discussed with patient who had an opportunity to ask questions and these were all answered.Supportive psychotherapy was also provided.I spend38minutes in phone consultation with the patient.   Stephanie Acre, MD 01/06/2019, 3:58 PM

## 2019-01-07 ENCOUNTER — Other Ambulatory Visit (HOSPITAL_COMMUNITY): Payer: Self-pay | Admitting: Psychiatry

## 2019-01-07 ENCOUNTER — Telehealth (HOSPITAL_COMMUNITY): Payer: Self-pay | Admitting: *Deleted

## 2019-01-07 DIAGNOSIS — I5032 Chronic diastolic (congestive) heart failure: Secondary | ICD-10-CM

## 2019-01-07 MED ORDER — PRAZOSIN HCL 2 MG PO CAPS: 2.0000 mg | ORAL_CAPSULE | Freq: Three times a day (TID) | ORAL | 0 refills | Status: DC

## 2019-01-07 NOTE — Telephone Encounter (Signed)
We indeed talked about increasing prazosin to tid but she is on other meds for hypertension so I have been concerned about potential drop in blood pressure. Since she insists on taking 2 mg and has a long hx of nightmares/flashbacks I did increase dose to 2 mg tid.

## 2019-01-07 NOTE — Telephone Encounter (Signed)
Patient called very upset stating her medication was not sent in to her pharmacy the way the doctor discussed with her. She states her Valium should be 10mg  TID and she is not able to function without her medication being correct. She states she is not drowsy and going through a lot of stress right now. She feels he is not taking her problems seriously. She feels she was more stable before she switched her doctor to coming here to [facility]. [name] gaining weight and feeling more anxiety. Also said he promised to increase her Prazosin to 2mg  TID and she found out it is 1mg  TID and that is not helpful. She states "I need my medications to stay calm and don't like my medication changed around. I am not a [place] pig." I need a counselor to talk to as well and he was going to work on that. Patient asking if someone else can give her the medication she needs. She feels she is very angry and needs to stay calm because she is in a shelter because her life is being threatened. Explained that a note will be left for her Dr. Also made her aware of crisis numbers if she felt unsafe or unable to safely handle with her anger/anxiety.

## 2019-01-08 ENCOUNTER — Telehealth (HOSPITAL_COMMUNITY): Payer: Self-pay | Admitting: *Deleted

## 2019-01-08 NOTE — Telephone Encounter (Signed)
Her medications have not been changed in 3 months at least except for increased prazosin dose few days ago. I am not going to increase Valium further - I discussed this with her few times already. [name] on Vyvanse for binge eating disorder and it helped her lose weight initially. I suspect that she has not taken it recently due to multiple pharmacy changes related to where she has been staying and this may be responsible for weight gain - she got refill on it though.

## 2019-01-08 NOTE — Telephone Encounter (Signed)
Patient called and was very upset stating she needs an increase in her Valium to three times daily and not two. [name] saying her insurance will not pay for 2 and she needs 3. Also upset that her medications were changed and she never wanted them to be. She has concerns about gaining weight because her medications have been changed. Patient cursing on phone and very upset with her psychiatrist. She was able to calm down and speak kind before hanging up the phone. She was notified of the increase in Prazosin.

## 2019-01-13 ENCOUNTER — Telehealth: Payer: Self-pay

## 2019-01-13 ENCOUNTER — Other Ambulatory Visit (HOSPITAL_COMMUNITY): Payer: Self-pay

## 2019-01-13 ENCOUNTER — Telehealth (HOSPITAL_COMMUNITY): Payer: Self-pay

## 2019-01-13 ENCOUNTER — Other Ambulatory Visit: Payer: Self-pay

## 2019-01-13 ENCOUNTER — Other Ambulatory Visit (HOSPITAL_COMMUNITY): Payer: Self-pay | Admitting: Psychiatry

## 2019-01-13 ENCOUNTER — Other Ambulatory Visit: Payer: Medicare HMO | Admitting: Internal Medicine

## 2019-01-13 DIAGNOSIS — R7989 Other specified abnormal findings of blood chemistry: Secondary | ICD-10-CM

## 2019-01-13 DIAGNOSIS — Y93E6 Activity, residential relocation: Secondary | ICD-10-CM | POA: Diagnosis not present

## 2019-01-13 DIAGNOSIS — Z515 Encounter for palliative care: Secondary | ICD-10-CM | POA: Diagnosis not present

## 2019-01-13 DIAGNOSIS — F319 Bipolar disorder, unspecified: Secondary | ICD-10-CM | POA: Diagnosis not present

## 2019-01-13 MED ORDER — DIAZEPAM 10 MG PO TABS: 10.0000 mg | ORAL_TABLET | Freq: Three times a day (TID) | ORAL | 2 refills | Status: DC

## 2019-01-13 NOTE — Telephone Encounter (Signed)
Error

## 2019-01-13 NOTE — Progress Notes (Signed)
[facility] Consult Note Telephone: [phone]  Fax: [phone]  PATIENT NAME: Tiffany [name] DOB: Feb 05, 1960 MRN: [id number]  PRIMARY CARE PROVIDER:   Patient, No Pcp Per  REFERRING PROVIDER: Maximiano Coss, NP  RESPONSIBLE PARTY:  Self   RECOMMENDATIONS and PLAN:  Palliative Care Encounter Z51.5  1.  Advance care planning:  Full scope of treatment is patient's current request for medical management.  Her goal is to stabaiize overall health and obtain more permanent housing.   2.  Multiple joint pains:  Chronic. Related to inability to attain Rx of medications normally taken for gout prevention and pain management control due to transportation difficulties. Message sent to R. Orland Mustard, NP to request refills for gout control meds, cyclobenzaprine and topical diclofinac. Encouraged patient to contact her pharmacist to request refills of mediations between today and establishment with a new PCP.  Remain hydrated.  Palliative care will follow-up with patient in 1-2 weeks.   3. Bipolar Disorder:  Strongly encouraged to take all prescriptions as prescribed and follow-up with Behavioral health provider. Make lists of tasks that need to be completed to improve consistency of same.   4.  Activity involving residential relocation:  Provided community resources and centers to assist with relocation and permanent housing.   Due to the COVID-19 crisis, this visit occurred telephonically and was initiated and consented by patient and or family.  I spent 60 minutes providing this consultation,  from 1100 to 1200. More than 50% of the time in this consultation was spent coordinating communication with patient.   HISTORY OF PRESENT ILLNESS: Follow-up with Tiffany [name] who reports complaints of hand and leg pains related to "gout" and not having all of her prescribed medications to control same. She has had more difficulty with ambulation related to  same. She denies falls. She reports feeling sad at this time of year and related to her overall circumstances.  No voiced desires for self harm.  Palliative Care was asked to help address goals of care.   CODE STATUS: FULL CODE  PPS: 50%  weak HOSPICE ELIGIBILITY/DIAGNOSIS: TBD  PERTINENT MEDICATIONS:  Outpatient Encounter Medications as of 01/13/2019  Medication Sig  . albuterol (PROVENTIL) (2.5 MG/3ML) 0.083% nebulizer solution Take 3 mLs (2.5 mg total) by nebulization every 6 (six) hours as needed for wheezing or shortness of breath.  [name] albuterol (VENTOLIN HFA) 108 (90 Base) MCG/ACT inhaler Inhale 2 puffs into the lungs every 6 (six) hours as needed for wheezing or shortness of breath.  [name] amLODipine (NORVASC) 5 MG tablet TAKE 1 TABLET (5 MG TOTAL) BY MOUTH EVERY MORNING  . Asenapine Maleate 10 MG SUBL Place 1 tablet (10 mg total) under the tongue 2 (two) times daily.  [name] aspirin (GOODSENSE ASPIRIN LOW DOSE) 81 MG EC tablet TAKE 1 TABLET (81 MG TOTAL) BY MOUTH DAILY (MORNING). (Patient taking differently: Take 81 mg by mouth daily. )  . atorvastatin (LIPITOR) 80 MG tablet Take 1 tablet (80 mg total) by mouth daily.  . budesonide-formoterol (SYMBICORT) 80-4.5 MCG/ACT inhaler Inhale 2 puffs into the lungs 2 (two) times daily.  . colchicine (COLCRYS) 0.6 MG tablet TAKE 1 TABLET (0.6 MG TOTAL) BY MOUTH 2 (TWO) TIMES DAILY.  . cyclobenzaprine (FLEXERIL) 5 MG tablet TAKE 1 TABLET BY MOUTH THREE TIMES A DAY AS NEEDED FOR MUSCLE SPASMS  . diazepam (VALIUM) 10 MG tablet Take 1 tablet (10 mg total) by mouth 2 (two) times daily.  . diclofenac (VOLTAREN) 75  MG EC tablet Take 1 tablet (75 mg total) by mouth 2 (two) times daily.  . diclofenac sodium (VOLTAREN) 1 % GEL Apply 2 g topically 4 (four) times daily.  [name] docusate sodium (COLACE) 100 MG capsule Take 1 capsule (100 mg total) by mouth daily as needed for mild constipation or moderate constipation.  [name] doxazosin (CARDURA) 2 MG tablet Take 1 tablet (2 mg  total) by mouth daily.  [name] escitalopram (LEXAPRO) 20 MG tablet Take 2 tablets (40 mg total) by mouth daily.  . febuxostat (ULORIC) 40 MG tablet Take 1 tablet (40 mg total) by mouth daily.  . ferrous sulfate 325 (65 FE) MG tablet Take 1 tablet (325 mg total) by mouth daily with breakfast.  . flunisolide (NASALIDE) 25 MCG/ACT (0.025%) SOLN Place 2 sprays into the nose daily.  . Fluticasone-Umeclidin-Vilant (TRELEGY ELLIPTA) 100-62.5-25 MCG/INH AEPB Inhale 1 puff into the lungs daily.  [name] gabapentin (NEURONTIN) 600 MG tablet Take 1 tablet (600 mg total) by mouth 2 (two) times daily.  [name] lisdexamfetamine (VYVANSE) 70 MG capsule Take 1 capsule (70 mg total) by mouth daily.  . metolazone (ZAROXOLYN) 2.5 MG tablet Take 1 tablet (2.5 mg total) by mouth daily. (Patient taking differently: Take 2.5 mg by mouth 3 (three) times a week. )  . miconazole (MICONAZOLE 7) 2 % vaginal cream Place 1 Applicatorful vaginally at bedtime.  . montelukast (SINGULAIR) 10 MG tablet TAKE 1 TABLET BY MOUTH EVERYDAY AT BEDTIME  . Multiple Vitamin (MULTIVITAMIN WITH MINERALS) TABS tablet Take 1 tablet by mouth daily.  [name] omeprazole (PRILOSEC) 40 MG capsule Take 1 capsule (40 mg total) by mouth 2 (two) times daily.  [name] oxybutynin (DITROPAN-XL) 10 MG 24 hr tablet Take 1-2 tablets (10-20 mg total) by mouth at bedtime.  . polyethylene glycol (MIRALAX / GLYCOLAX) 17 g packet Take 17 g by mouth daily as needed for mild constipation or moderate constipation.  . potassium chloride (K-DUR) 10 MEQ tablet Take 10 mEq by mouth 2 (two) times daily.  . prazosin (MINIPRESS) 2 MG capsule Take 1 capsule (2 mg total) by mouth 3 (three) times daily.  . tacrolimus (PROTOPIC) 0.03 % ointment Apply topically 2 (two) times daily.  [name] terconazole (TERAZOL 3) 0.8 % vaginal cream Apply pea sized amount to clitoris nightly.  . torsemide (DEMADEX) 20 MG tablet Take 4 tablets (80 mg total) by mouth 2 (two) times daily.  . traZODone (DESYREL) 100 MG tablet Take 1  tablet (100 mg total) by mouth at bedtime.  . traZODone (DESYREL) 50 MG tablet Take 1 tablet (50 mg total) by mouth daily.  [name] zinc oxide 20 % ointment Apply 1 application topically as needed for irritation.   No facility-administered encounter medications on file as of 01/13/2019.    PHYSICAL EXAM:   General: NAD Pulmonary: speech is not forced Neurological: Alert and oriented. Psych:  Intermittently tearful.  Gonzella Lex, NP-C

## 2019-01-13 NOTE — Telephone Encounter (Signed)
We've been arguing about it for a few months now - it seem she will not accept that bid is enough so I changed the schedule back to tid with the pharmacy.

## 2019-01-13 NOTE — Telephone Encounter (Signed)
Received message to call patient. Phone call  Placed to provided number. Message left.

## 2019-01-13 NOTE — Telephone Encounter (Signed)
Patient called regarding her Valium 10mg . The script is for 1 tablet po BID. She stated that she used to take it TID and would like to go back to that. Please advise. Thank you.

## 2019-01-14 ENCOUNTER — Telehealth (HOSPITAL_COMMUNITY): Payer: Self-pay

## 2019-01-14 ENCOUNTER — Ambulatory Visit: Payer: Medicare HMO | Admitting: Emergency Medicine

## 2019-01-14 NOTE — Telephone Encounter (Signed)
She has been on this medication for a few years so she should stay on it. Asenapine is a generic version of Saphris (I did not know there is a generic already) so let's ask for that. Same dose.

## 2019-01-14 NOTE — Telephone Encounter (Signed)
I received a prior authorization fax for this patient's Saphris 10mg  sublingual tablet. It has been rejected by insurance. It was stated that Asenapine Maleate 10mg  sublingual Tier 1 is more likely to be covered. Would you like me to proceed with a prior authorization for that or put her on something else? Please advise. Thank you.

## 2019-01-15 DIAGNOSIS — I10 Essential (primary) hypertension: Secondary | ICD-10-CM | POA: Diagnosis not present

## 2019-01-15 DIAGNOSIS — F332 Major depressive disorder, recurrent severe without psychotic features: Secondary | ICD-10-CM | POA: Diagnosis not present

## 2019-01-15 DIAGNOSIS — F4310 Post-traumatic stress disorder, unspecified: Secondary | ICD-10-CM | POA: Diagnosis not present

## 2019-01-16 NOTE — Telephone Encounter (Signed)
Spoke with [facility] [address] regarding her Saphris 10mg  sublingual. The pharmacy stated that is was sold with [facility] copay [facility] [address] [facility] [place]. [facility]aphris [facility]. The pharmacies must have run it back through and went through the second time.

## 2019-01-19 ENCOUNTER — Telehealth: Payer: Self-pay | Admitting: Internal Medicine

## 2019-01-19 NOTE — Telephone Encounter (Signed)
-  Returned phone call to patient who is requesting that I contact her former PCP, Maximiano Coss, NP to request him to send in refills of some of her current maintenance medications for treatment of gout and muscle spasms.  I informed her again that I have contacted Mr. Orland Mustard and made this request in good faith for her.  He stated that he will be able to due this for her while she has a waiting period prior to establishing with another PCP.  I asked her to phone her pharmacies that were previously selected to inquire about refill status of the medications in question.  The pharmacy will be able to contact the prescribing provider as well for refill authorization.  -Patient also asked for transportation to pharmacy and relocation.  I informed her that I could not perform that task and referred her to the [employer], [employer] [employer] and [employer] to assist with current transportation needs.  Pt. Can also discuss with  management of current housing location. Palliative care is not able to assist with these matters.  Gonzella Lex, NP-C

## 2019-01-21 ENCOUNTER — Ambulatory Visit (HOSPITAL_COMMUNITY): Payer: Medicare HMO | Admitting: Psychiatry

## 2019-01-21 ENCOUNTER — Other Ambulatory Visit: Payer: Self-pay

## 2019-01-21 ENCOUNTER — Encounter (HOSPITAL_COMMUNITY): Payer: Self-pay | Admitting: Psychiatry

## 2019-01-21 NOTE — Progress Notes (Signed)
Attempted to contact Tiffany [name] at both provided numbers, but automated message stated the numbers have been disconnected.

## 2019-01-23 ENCOUNTER — Telehealth: Payer: Self-pay | Admitting: Registered Nurse

## 2019-01-23 NOTE — Telephone Encounter (Signed)
Tiffany [name] from [facility] is calling to get courtesy refill that was discussed  cyclobenzaprine (FLEXERIL) 5 MG tablet sent to   [facility] at  [name] please see message in epic no

## 2019-01-24 ENCOUNTER — Telehealth: Payer: Self-pay | Admitting: Internal Medicine

## 2019-01-24 ENCOUNTER — Other Ambulatory Visit: Payer: Self-pay | Admitting: Registered Nurse

## 2019-01-24 DIAGNOSIS — R0789 Other chest pain: Secondary | ICD-10-CM

## 2019-01-24 DIAGNOSIS — J449 Chronic obstructive pulmonary disease, unspecified: Secondary | ICD-10-CM | POA: Diagnosis not present

## 2019-01-24 DIAGNOSIS — Z515 Encounter for palliative care: Secondary | ICD-10-CM

## 2019-01-24 MED ORDER — CYCLOBENZAPRINE HCL 5 MG PO TABS: ORAL_TABLET | ORAL | 0 refills | Status: DC

## 2019-01-24 NOTE — Telephone Encounter (Signed)
Please advise on final courtesy refill for pt.   Thanks

## 2019-01-24 NOTE — Telephone Encounter (Signed)
-  Returned call to patient to inform her that I have again contacted office of former PCP, Maximiano Coss, NP and have requested of his staff to notify him of a request to refill her Rx of Cyclobenzaprine 5mg  while she is awaiting a pending appointment with a new PCP.  I have informed the patient that I am not able to perform any other action related to this or any request.  Also encouraged her to contact her local pharmacy [facility] or [facility] [place] to check on the status of any of her prescriptions.  -Resources for community assistance have been provided to patient in the most recent past for assistance with housing, transportation and personal needs. -No current complaints of shortness of breath, chest pain or edema related to CHF or coronary artery disease. -No further actions will be taken for this patient by this palliative care provider or [facility]. Dr. Alferd Patee was informed of patient status and above plans.                                                                                 Tiffany [name]- NP-C

## 2019-01-24 NOTE — Telephone Encounter (Signed)
Final courtesy fill sent. This patient has established with [facility] in [place], who from this point forward, will assume responsibility for her care as they see fit.   Thank you  Kathrin Ruddy, NP

## 2019-01-24 NOTE — Progress Notes (Signed)
Final courtesy fill of cyclobenzaprine per request of Tiffany Lex, NP, palliative care provider.  Tiffany [name] has been dismissed from care in our office as detailed in previous encounters.  Tiffany Ruddy, NP

## 2019-01-24 NOTE — Telephone Encounter (Signed)
Noted  

## 2019-01-30 DIAGNOSIS — J449 Chronic obstructive pulmonary disease, unspecified: Secondary | ICD-10-CM | POA: Diagnosis not present

## 2019-01-30 DIAGNOSIS — J9612 Chronic respiratory failure with hypercapnia: Secondary | ICD-10-CM | POA: Diagnosis not present

## 2019-01-30 DIAGNOSIS — J9611 Chronic respiratory failure with hypoxia: Secondary | ICD-10-CM | POA: Diagnosis not present

## 2019-02-03 ENCOUNTER — Encounter (HOSPITAL_COMMUNITY): Payer: Self-pay | Admitting: Emergency Medicine

## 2019-02-03 ENCOUNTER — Ambulatory Visit (HOSPITAL_COMMUNITY): Payer: Medicare HMO | Admitting: Psychiatry

## 2019-02-03 ENCOUNTER — Other Ambulatory Visit: Payer: Self-pay

## 2019-02-03 ENCOUNTER — Emergency Department (HOSPITAL_COMMUNITY)
Admission: EM | Admit: 2019-02-03 | Discharge: 2019-02-04 | Disposition: A | Discharge status: Home / Self Care | Payer: Medicare HMO | Attending: Emergency Medicine | Admitting: Emergency Medicine

## 2019-02-03 DIAGNOSIS — Z638 Other specified problems related to primary support group: Secondary | ICD-10-CM

## 2019-02-03 DIAGNOSIS — Z87891 Personal history of nicotine dependence: Secondary | ICD-10-CM | POA: Insufficient documentation

## 2019-02-03 DIAGNOSIS — J45909 Unspecified asthma, uncomplicated: Secondary | ICD-10-CM | POA: Diagnosis not present

## 2019-02-03 DIAGNOSIS — I2510 Atherosclerotic heart disease of native coronary artery without angina pectoris: Secondary | ICD-10-CM | POA: Insufficient documentation

## 2019-02-03 DIAGNOSIS — I5032 Chronic diastolic (congestive) heart failure: Secondary | ICD-10-CM | POA: Diagnosis not present

## 2019-02-03 DIAGNOSIS — Z7984 Long term (current) use of oral hypoglycemic drugs: Secondary | ICD-10-CM | POA: Diagnosis not present

## 2019-02-03 DIAGNOSIS — R456 Violent behavior: Secondary | ICD-10-CM | POA: Diagnosis not present

## 2019-02-03 DIAGNOSIS — Z7982 Long term (current) use of aspirin: Secondary | ICD-10-CM | POA: Diagnosis not present

## 2019-02-03 DIAGNOSIS — E1122 Type 2 diabetes mellitus with diabetic chronic kidney disease: Secondary | ICD-10-CM | POA: Diagnosis not present

## 2019-02-03 DIAGNOSIS — F250 Schizoaffective disorder, bipolar type: Secondary | ICD-10-CM | POA: Diagnosis present

## 2019-02-03 DIAGNOSIS — I130 Hypertensive heart and chronic kidney disease with heart failure and stage 1 through stage 4 chronic kidney disease, or unspecified chronic kidney disease: Secondary | ICD-10-CM | POA: Diagnosis not present

## 2019-02-03 DIAGNOSIS — R4689 Other symptoms and signs involving appearance and behavior: Secondary | ICD-10-CM

## 2019-02-03 DIAGNOSIS — Z79899 Other long term (current) drug therapy: Secondary | ICD-10-CM | POA: Insufficient documentation

## 2019-02-03 DIAGNOSIS — R45851 Suicidal ideations: Secondary | ICD-10-CM | POA: Diagnosis not present

## 2019-02-03 DIAGNOSIS — N183 Chronic kidney disease, stage 3 unspecified: Secondary | ICD-10-CM | POA: Diagnosis not present

## 2019-02-03 DIAGNOSIS — F309 Manic episode, unspecified: Secondary | ICD-10-CM | POA: Diagnosis present

## 2019-02-03 DIAGNOSIS — Z20822 Contact with and (suspected) exposure to covid-19: Secondary | ICD-10-CM | POA: Insufficient documentation

## 2019-02-03 DIAGNOSIS — F332 Major depressive disorder, recurrent severe without psychotic features: Secondary | ICD-10-CM | POA: Insufficient documentation

## 2019-02-03 LAB — COMPREHENSIVE METABOLIC PANEL
ALT: 19 U/L (ref 0–44)
AST: 20 U/L (ref 15–41)
Albumin: 3.7 g/dL (ref 3.5–5.0)
Alkaline Phosphatase: 109 U/L (ref 38–126)
Anion gap: 6 (ref 5–15)
BUN: 18 mg/dL (ref 6–20)
CO2: 29 mmol/L (ref 22–32)
Calcium: 9.1 mg/dL (ref 8.9–10.3)
Chloride: 107 mmol/L (ref 98–111)
Creatinine, Ser: 1.53 mg/dL — ABNORMAL HIGH (ref 0.44–1.00)
GFR calc Af Amer: 43 mL/min — ABNORMAL LOW (ref >60)
GFR calc non Af Amer: 37 mL/min — ABNORMAL LOW (ref >60)
Glucose, Bld: 92 mg/dL (ref 70–99)
Potassium: 4.8 mmol/L (ref 3.5–5.1)
Sodium: 142 mmol/L (ref 135–145)
Total Bilirubin: 0.2 mg/dL — ABNORMAL LOW (ref 0.3–1.2)
Total Protein: 6.4 g/dL — ABNORMAL LOW (ref 6.5–8.1)

## 2019-02-03 LAB — CBC WITH DIFFERENTIAL/PLATELET
Abs Immature Granulocytes: 0.01 10*3/uL (ref 0.00–0.07)
Basophils Absolute: 0 10*3/uL (ref 0.0–0.1)
Basophils Relative: 1 %
Eosinophils Absolute: 0.3 10*3/uL (ref 0.0–0.5)
Eosinophils Relative: 6 %
HCT: 30.6 % — ABNORMAL LOW (ref 36.0–46.0)
Hemoglobin: 9 g/dL — ABNORMAL LOW (ref 12.0–15.0)
Immature Granulocytes: 0 %
Lymphocytes Relative: 37 %
Lymphs Abs: 1.6 10*3/uL (ref 0.7–4.0)
MCH: 28 pg (ref 26.0–34.0)
MCHC: 29.4 g/dL — ABNORMAL LOW (ref 30.0–36.0)
MCV: 95 fL (ref 80.0–100.0)
Monocytes Absolute: 0.4 10*3/uL (ref 0.1–1.0)
Monocytes Relative: 8 %
Neutro Abs: 2.1 10*3/uL (ref 1.7–7.7)
Neutrophils Relative %: 48 %
Platelets: 323 10*3/uL (ref 150–400)
RBC: 3.22 MIL/uL — ABNORMAL LOW (ref 3.87–5.11)
RDW: 14.6 % (ref 11.5–15.5)
WBC: 4.4 10*3/uL (ref 4.0–10.5)
nRBC: 0 % (ref 0.0–0.2)

## 2019-02-03 LAB — ETHANOL: Alcohol, Ethyl (B): <10 mg/dL (ref <10)

## 2019-02-03 LAB — RESPIRATORY PANEL BY RT PCR (FLU A&B, COVID)
Influenza A by PCR: NEGATIVE
Influenza B by PCR: NEGATIVE
SARS Coronavirus 2 by RT PCR: NEGATIVE

## 2019-02-03 MED ORDER — LISDEXAMFETAMINE DIMESYLATE 70 MG PO CAPS
70.0000 mg | ORAL_CAPSULE | Freq: Every day | ORAL | Status: DC
  Administered 2019-02-04: 70 mg via ORAL
  Filled 2019-02-03: qty 1

## 2019-02-03 MED ORDER — GABAPENTIN 300 MG PO CAPS
600.0000 mg | ORAL_CAPSULE | Freq: Two times a day (BID) | ORAL | Status: DC
  Administered 2019-02-04: 600 mg via ORAL
  Filled 2019-02-03: qty 2

## 2019-02-03 MED ORDER — PANTOPRAZOLE SODIUM 40 MG PO TBEC
40.0000 mg | DELAYED_RELEASE_TABLET | Freq: Every day | ORAL | Status: DC
  Administered 2019-02-04: 40 mg via ORAL
  Filled 2019-02-03: qty 1

## 2019-02-03 MED ORDER — FLUTICASONE FUROATE-VILANTEROL 100-25 MCG/INH IN AEPB
1.0000 | INHALATION_SPRAY | Freq: Every day | RESPIRATORY_TRACT | Status: DC
  Administered 2019-02-04: 1 via RESPIRATORY_TRACT
  Filled 2019-02-03: qty 28

## 2019-02-03 MED ORDER — TRAZODONE HCL 50 MG PO TABS: 50.0000 mg | ORAL_TABLET | Freq: Every day | ORAL | Status: DC

## 2019-02-03 MED ORDER — OXYBUTYNIN CHLORIDE ER 10 MG PO TB24
10.0000 mg | ORAL_TABLET | Freq: Every day | ORAL | Status: DC
  Filled 2019-02-03: qty 2

## 2019-02-03 MED ORDER — FEBUXOSTAT 40 MG PO TABS
40.0000 mg | ORAL_TABLET | Freq: Every day | ORAL | Status: DC
  Administered 2019-02-04: 40 mg via ORAL
  Filled 2019-02-03: qty 1

## 2019-02-03 MED ORDER — TORSEMIDE 20 MG PO TABS
80.0000 mg | ORAL_TABLET | Freq: Two times a day (BID) | ORAL | Status: DC
  Administered 2019-02-04: 80 mg via ORAL
  Filled 2019-02-03: qty 4

## 2019-02-03 MED ORDER — ASPIRIN 81 MG PO TBEC
81.0000 mg | DELAYED_RELEASE_TABLET | Freq: Every day | ORAL | Status: DC
  Administered 2019-02-03 – 2019-02-04 (×2): 81 mg via ORAL
  Filled 2019-02-03 (×4): qty 1

## 2019-02-03 MED ORDER — UMECLIDINIUM BROMIDE 62.5 MCG/INH IN AEPB
1.0000 | INHALATION_SPRAY | Freq: Every day | RESPIRATORY_TRACT | Status: DC
  Administered 2019-02-04: 1 via RESPIRATORY_TRACT
  Filled 2019-02-03: qty 7

## 2019-02-03 MED ORDER — DIAZEPAM 2 MG PO TABS
2.0000 mg | ORAL_TABLET | Freq: Three times a day (TID) | ORAL | Status: DC | PRN
  Administered 2019-02-04: 2 mg via ORAL
  Filled 2019-02-03: qty 1

## 2019-02-03 MED ORDER — ALBUTEROL SULFATE HFA 108 (90 BASE) MCG/ACT IN AERS: 2.0000 | INHALATION_SPRAY | Freq: Four times a day (QID) | RESPIRATORY_TRACT | Status: DC | PRN

## 2019-02-03 MED ORDER — OXYBUTYNIN CHLORIDE ER 10 MG PO TB24
10.0000 mg | ORAL_TABLET | Freq: Every day | ORAL | Status: DC
  Administered 2019-02-03: 10 mg via ORAL
  Filled 2019-02-03: qty 1

## 2019-02-03 MED ORDER — PRAZOSIN HCL 1 MG PO CAPS: 2.0000 mg | ORAL_CAPSULE | Freq: Three times a day (TID) | ORAL | Status: DC

## 2019-02-03 MED ORDER — FLUTICASONE-UMECLIDIN-VILANT 100-62.5-25 MCG/INH IN AEPB: 1.0000 | INHALATION_SPRAY | Freq: Every day | RESPIRATORY_TRACT | Status: DC

## 2019-02-03 MED ORDER — ATORVASTATIN CALCIUM 80 MG PO TABS
80.0000 mg | ORAL_TABLET | Freq: Every day | ORAL | Status: DC
  Administered 2019-02-03 – 2019-02-04 (×2): 80 mg via ORAL
  Filled 2019-02-03 (×2): qty 1

## 2019-02-03 MED ORDER — AMLODIPINE BESYLATE 5 MG PO TABS
5.0000 mg | ORAL_TABLET | Freq: Every day | ORAL | Status: DC
  Administered 2019-02-04: 5 mg via ORAL
  Filled 2019-02-03: qty 1

## 2019-02-03 NOTE — ED Triage Notes (Addendum)
Per EMS pt IVC'd by mother; per mother pt plan to overdose on medicine; pt denies SI or plan to harm self. Pt told EMS that her mother is having a relationship with her (pt's) son; and that she and her mom don't get along.  Pt hx of schizophrenia. Pt requesting SW r/t not wanting to live with her mom anymore.  VS with EMS:  162/90 BP 86 HR 20 RR 98% oxygen RA (uses 3 lpm oxygen at home; EMS placed pt on 3 lpm on arrival to ER)   5 mg of versed IM given with EMS r/t aggression.

## 2019-02-03 NOTE — ED Notes (Signed)
On admission to the TCU pt is irritable and demanding.  ONE BLACK BAG, ONE BLACK AND WHTE BAG ARE IN THE SAPPU DAY ROOM. [place] WITH A TELEPHONE IS IN LOCKER # 30. JEWELRY IS IN BIO-HAZARD BAG.
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
## 2019-02-03 NOTE — [facility] Assessment (Signed)
[facility] Assessment Progress Note  Case was staffed with [place] NP who recommended patient be observed and monitored.      

## 2019-02-03 NOTE — ED Provider Notes (Signed)
Patient is medically cleared for psychiatry.  Patient is medically stable on 3 L of oxygen she usually uses at home   Milton Ferguson, MD 02/03/19 1732
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
## 2019-02-04 ENCOUNTER — Encounter (HOSPITAL_COMMUNITY): Payer: Self-pay | Admitting: Registered Nurse

## 2019-02-04 ENCOUNTER — Ambulatory Visit (INDEPENDENT_AMBULATORY_CARE_PROVIDER_SITE_OTHER): Payer: Medicare HMO | Admitting: Psychiatry

## 2019-02-04 ENCOUNTER — Encounter (HOSPITAL_COMMUNITY): Payer: Self-pay | Admitting: Psychiatry

## 2019-02-04 DIAGNOSIS — Z638 Other specified problems related to primary support group: Secondary | ICD-10-CM

## 2019-02-04 DIAGNOSIS — F250 Schizoaffective disorder, bipolar type: Secondary | ICD-10-CM

## 2019-02-04 DIAGNOSIS — F4312 Post-traumatic stress disorder, chronic: Secondary | ICD-10-CM

## 2019-02-04 DIAGNOSIS — R4689 Other symptoms and signs involving appearance and behavior: Secondary | ICD-10-CM | POA: Insufficient documentation

## 2019-02-04 LAB — RAPID URINE DRUG SCREEN, HOSP PERFORMED
Amphetamines: POSITIVE — AB
Barbiturates: NOT DETECTED
Benzodiazepines: POSITIVE — AB
Cocaine: NOT DETECTED
Opiates: NOT DETECTED
Tetrahydrocannabinol: NOT DETECTED

## 2019-02-04 MED ORDER — TRAZODONE HCL 50 MG PO TABS
50.0000 mg | ORAL_TABLET | Freq: Every day | ORAL | Status: DC
  Administered 2019-02-04: 50 mg via ORAL
  Filled 2019-02-04: qty 1

## 2019-02-04 NOTE — Progress Notes (Signed)
Counselor attempted to contact [employer] via telephone, however, both numbers in the system were disconnected. She was seen last in the ED yesterday for mania per her chart. Counselor will attempt to reach out to hospital staff and providers to secure a more accurate number for her. Counselor and Psyc Nurse has made attempts to establish enhanced services for [place] to meet her in the home or community and provider case management to better meet her needs. Counselor unaware if Jalila has consented or followed up with recommendations to establish services. Counselor will be taking leave after 2/26 and will return in mid-May. Kiyoko needs to be transferred to a new provider until counselors return for continuity of care.

## 2019-02-04 NOTE — Consult Note (Signed)
[facility] ED Discharge  02/04/2019 9:28 AM Tiffany [name]  MRN:  [id number] Principal Problem: Family discord Discharge Diagnoses: Principal Problem:   Family discord Active Problems:   Schizoaffective disorder, bipolar type [facility])   Subjective: Tiffany [name], 59 y.o., female patient seen via tele psych by this provider, Dr. Dwyane Dee; and chart reviewed on 02/04/19.  On evaluation Tiffany [name] reports that she and her mother got into an argument when she told her mother that she wanted to move out.  "I don't want to live there anymore.  I want to be on my own.  Patient states that she has an appointment today at 2 pm with psychiatric provider.  Patient denies suicidal/self-harm/homicidal ideation, psychosis, and paranoia.  "When ever I feel like that I come in on my own."  Patient states that she is taking medications as ordered without adverse reactions, sleeping and eating without difficulty.  Patient states that she does not need to be in the hospital.   During evaluation Tiffany [name] is alert/oriented x 4; calm/cooperative; and mood is congruent with affect.  She does not appear to be responding to internal/external stimuli or delusional thoughts.  Patient denies suicidal/self-harm/homicidal ideation, psychosis, and paranoia.  Patient answered question appropriately.     Total Time spent with patient: 30 minutes  Past Psychiatric History: Schizoaffective disorder  Past Medical History:  Past Medical History:  Diagnosis Date  . Agitation 11/22/2017  . AKI (acute kidney injury) ([place]) 11/21/2017  . Anemia 04/24/2011  . Anoxic brain injury ([place]) 09/08/2016   C. Arrest due to respiratory failure and COPD exacerbation  . Anxiety   . Arthritis    "all over" (04/10/2016)  . Asthma 10/18/2010  . Binge eating disorder   . Cardiac arrest ([place]) 09/08/2016   PEA  . Carotid artery stenosis    1-39% bilateral by dopplers 11/2016  . Chronic bronchitis ([place])   . Chronic diastolic (congestive)  heart failure ([place])   . Chronic kidney disease    "I see a kidney dr." (04/10/2016)  . Chronic pain syndrome 06/18/2012  . Chronic post-traumatic stress disorder (PTSD) 05/27/2018  . Chronic respiratory failure with hypoxia and hypercapnia (HCC) 06/22/2015   TRILOGY Vent >AVAPA-ES., Vt target 200-400, Max P 30 , PS max 20 , PS min 6-10 , E Max 6, E Min 4, Rate Auto AVAPS Rate 2 (titrate for pt comfort) , bleed O2 at 5l/m continuous flow .   [name] CKD (chronic kidney disease) stage 3, GFR 30-59 ml/min 12/15/2016  . Closed displaced fracture of fifth metacarpal bone 03/21/2018  . Cocaine use disorder, severe, in sustained remission ([place]) 12/17/2015  . Complication of anesthesia    decreased bp, decreased heart rate  . Constipation   . COPD (chronic obstructive pulmonary disease) ([place]) 07/08/2014  . Depression   . Diabetic neuropathy ([place]) 04/24/2011  . Difficulty with speech 01/24/2018  . Disorder of nervous system   . Drug abuse ([place]) 11/21/2017  . Dyslipidemia 04/24/2011  . Dyspnea   . Elevated troponin 04/28/2012  . Emphysema   . Encephalopathy 11/21/2017  . Essential hypertension 03/22/2016  . Fall   . Fibula fracture 07/10/2016  . Frequent falls 10/11/2017  . GERD (gastroesophageal reflux disease)   . Gout 04/11/2017  . Heart attack ([place]) 1980s  . History of blood transfusion 1994   "couldn't stop bleeding from my period"  . History of drug abuse in remission ([place]) 11/28/2015   Quit in 2017  . Hyperlipidemia LDL  goal <70   . Hypertension   . Incontinence   . Manic depression ([place])   . Morbid obesity ([place]) 10/18/2010  . Obstructive sleep apnea 10/18/2010  . On home oxygen therapy    "6L; 24/7" (04/10/2016)  . OSA on CPAP    "wear mask sometimes" (04/10/2016)  . Paranoid ([place])    "sometimes; I'm on RX for it" (04/10/2016)  . Pneumonia    "I've had it several times; haven't had it since 06/2015" (04/10/2016)  . Prolonged Q-T interval on ECG   . QT prolongation   . Rectal bleeding 12/31/2015  . Right  carotid bruit 11/09/2016  . Right foot pain 11/15/2016  . Scabies 06/01/2015  . Schizoaffective disorder, bipolar type ([place]) 04/05/2018  . Seasonal allergies   . Seborrheic keratoses 12/31/2013  . Seizure ([place]) 01/04/2013  . Seizures ([place])    "don't know what kind; last one was ~ 1 yr ago" (04/10/2016)  . Sinus trouble   . Skin irritation 04/07/2012  . SOB (shortness of breath) 04/10/2016  . Stroke [facility]) 1980s   denies residual on 04/10/2016  . Syncope 01/06/2017  . Thrush 09/19/2013  . Tobacco use disorder 07/22/2014  . Type 2 diabetes mellitus ([place]) 10/18/2010  . Type II diabetes mellitus ([place])   . Upper abdominal pain   . Vaginal discharge 01/03/2018  . Wheezing 04/12/2016  . Wound, breast 01/03/2018    Past Surgical History:  Procedure Laterality Date  . CESAREAN SECTION  1997  . HERNIA REPAIR    . IR CHOLANGIOGRAM EXISTING TUBE  07/20/2016  . IR PERC CHOLECYSTOSTOMY  05/10/2016  . IR RADIOLOGIST EVAL & MGMT  06/08/2016  . IR RADIOLOGIST EVAL & MGMT  06/29/2016  . IR SINUS/FIST TUBE CHK-NON GI  07/12/2016  . RIGHT/LEFT HEART CATH AND CORONARY ANGIOGRAPHY N/A 06/19/2017   Procedure: RIGHT/LEFT HEART CATH AND CORONARY ANGIOGRAPHY;  Surgeon: Jolaine Artist, MD;  Location: [place];  Service: Cardiovascular;  Laterality: N/A;  . TIBIA IM NAIL INSERTION Right 07/12/2016   Procedure: INTRAMEDULLARY (IM) NAIL RIGHT TIBIA;  Surgeon: Leandrew Koyanagi, MD;  Location: [facility];  Service: Orthopedics;  Laterality: Right;  . UMBILICAL HERNIA REPAIR  ~ 1963   "that's why I don't have a belly button"  . VAGINAL HYSTERECTOMY     Family History:  Family History  Problem Relation Age of Onset  . Cancer Father        prostate  . Cancer Mother        lung  . Depression Mother   . Depression Sister   . Anxiety disorder Sister   . Schizophrenia Sister   . Bipolar disorder Sister   . Depression Sister   . Depression Brother   . Heart failure Other        cousin   Family Psychiatric   History: See above Social History:  Social History   Substance and Sexual Activity  Alcohol Use No  . Alcohol/week: 0.0 standard drinks     Social History   Substance and Sexual Activity  Drug Use No  . Types: Cocaine   Comment: 04/10/2016 "last used cocaine back in November 2017"    Social History   Socioeconomic History  . Marital status: Widowed    Spouse name: Not on file  . Number of children: 3  . Years of education: Not on file  . Highest education level: Not on file  Occupational History  . Occupation: disabled    Comment: factory  production  Tobacco Use  . Smoking status: Former Smoker    Packs/day: 1.50    Years: 38.00    Pack years: 57.00    Types: Cigarettes    Start date: 03/13/1977    Quit date: 04/10/2016    Years since quitting: 2.8  . Smokeless tobacco: Never Used  Substance and Sexual Activity  . Alcohol use: No    Alcohol/week: 0.0 standard drinks  . Drug use: No    Types: Cocaine    Comment: 04/10/2016 "last used cocaine back in November 2017"  . Sexual activity: Not Currently    Birth control/protection: Surgical  Other Topics Concern  . Not on file  Social History Narrative   Has 1 son, [name]   Lives with son and his boyfriend   Her house has ramps and handrails should she ever needs them.    Her mother lives down the street from her and is a good support person in addition to her son.   She drives herself, has private transportation.    Cocaine free since 02/24/16, smoke free since 04/10/16   Social Determinants of Health   Financial Resource Strain:   . Difficulty of Paying Living Expenses: Not on file  Food Insecurity: Food Insecurity Present  . Worried About Charity fundraiser in the Last Year: Sometimes true  . Ran Out of Food in the Last Year: Never true  Transportation Needs: No Transportation Needs  . Lack of Transportation (Medical): No  . Lack of Transportation (Non-Medical): No  Physical Activity:   . Days of Exercise per  Week: Not on file  . Minutes of Exercise per Session: Not on file  Stress:   . Feeling of Stress : Not on file  Social Connections:   . Frequency of Communication with Friends and Family: Not on file  . Frequency of Social Gatherings with Friends and Family: Not on file  . Attends Religious Services: Not on file  . Active Member of Clubs or Organizations: Not on file  . Attends Archivist Meetings: Not on file  . Marital Status: Not on file    Has this patient used any form of tobacco in the last 30 days? (Cigarettes, Smokeless Tobacco, Cigars, and/or Pipes) Prescription not provided because: Patient does not use tobacco products  Current Medications: Current Facility-Administered Medications  Medication Dose Route Frequency Provider Last Rate Last Admin  . albuterol (VENTOLIN HFA) 108 (90 Base) MCG/ACT inhaler 2 puff  2 puff Inhalation Q6H PRN Curatolo, Adam, DO      . amLODipine (NORVASC) tablet 5 mg  5 mg Oral Daily Curatolo, Adam, DO      . aspirin EC tablet 81 mg  81 mg Oral Daily Curatolo, Adam, DO   81 mg at 02/03/19 1726  . atorvastatin (LIPITOR) tablet 80 mg  80 mg Oral Daily Curatolo, Adam, DO   80 mg at 02/03/19 1726  . diazepam (VALIUM) tablet 2 mg  2 mg Oral Q8H PRN Curatolo, Adam, DO   2 mg at 02/04/19 0022  . febuxostat (ULORIC) tablet 40 mg  40 mg Oral Daily Curatolo, Adam, DO      . fluticasone furoate-vilanterol (BREO ELLIPTA) 100-25 MCG/INH 1 puff  1 puff Inhalation Daily Milton Ferguson, MD       And  . umeclidinium bromide (INCRUSE ELLIPTA) 62.5 MCG/INH 1 puff  1 puff Inhalation Daily Milton Ferguson, MD      . gabapentin (NEURONTIN) capsule 600 mg  600 mg Oral  BID Curatolo, Adam, DO      . lisdexamfetamine (VYVANSE) capsule 70 mg  70 mg Oral Daily Curatolo, Adam, DO      . oxybutynin (DITROPAN-XL) 24 hr tablet 10 mg  10 mg Oral Lauris Chroman, MD   10 mg at 02/03/19 2151  . pantoprazole (PROTONIX) EC tablet 40 mg  40 mg Oral Daily Curatolo, Adam, DO       . torsemide (DEMADEX) tablet 80 mg  80 mg Oral BID Curatolo, Adam, DO      . traZODone (DESYREL) tablet 50 mg  50 mg Oral QHS Curatolo, Adam, DO   50 mg at 02/04/19 0021   Current Outpatient Medications  Medication Sig Dispense Refill  . albuterol (VENTOLIN HFA) 108 (90 Base) MCG/ACT inhaler Inhale 2 puffs into the lungs every 6 (six) hours as needed for wheezing or shortness of breath. 18 g 2  . amLODipine (NORVASC) 5 MG tablet TAKE 1 TABLET (5 MG TOTAL) BY MOUTH EVERY MORNING 90 tablet 3  . Asenapine Maleate 10 MG SUBL Place 1 tablet (10 mg total) under the tongue 2 (two) times daily. 180 tablet 0  . aspirin (GOODSENSE ASPIRIN LOW DOSE) 81 MG EC tablet TAKE 1 TABLET (81 MG TOTAL) BY MOUTH DAILY (MORNING). (Patient taking differently: Take 81 mg by mouth daily. ) 90 tablet 3  . atorvastatin (LIPITOR) 80 MG tablet Take 1 tablet (80 mg total) by mouth daily. 90 tablet 0  . colchicine (COLCRYS) 0.6 MG tablet TAKE 1 TABLET (0.6 MG TOTAL) BY MOUTH 2 (TWO) TIMES DAILY. 180 tablet 3  . diazepam (VALIUM) 10 MG tablet Take 1 tablet (10 mg total) by mouth 3 (three) times daily. 90 tablet 2  . diclofenac sodium (VOLTAREN) 1 % GEL Apply 2 g topically 4 (four) times daily. 100 g 0  . docusate sodium (COLACE) 100 MG capsule Take 1 capsule (100 mg total) by mouth daily as needed for mild constipation or moderate constipation. 10 capsule 2  . escitalopram (LEXAPRO) 20 MG tablet Take 2 tablets (40 mg total) by mouth daily. 180 tablet 0  . febuxostat (ULORIC) 40 MG tablet Take 1 tablet (40 mg total) by mouth daily. 90 tablet 0  . ferrous sulfate 325 (65 FE) MG tablet Take 1 tablet (325 mg total) by mouth daily with breakfast. 90 tablet 3  . flunisolide (NASALIDE) 25 MCG/ACT (0.025%) SOLN Place 2 sprays into the nose daily. 1 Bottle 2  . Fluticasone-Umeclidin-Vilant (TRELEGY ELLIPTA) 100-62.5-25 MCG/INH AEPB Inhale 1 puff into the lungs daily. 60 each 1  . gabapentin (NEURONTIN) 600 MG tablet Take 1 tablet (600  mg total) by mouth 2 (two) times daily. 180 tablet 1  . lidocaine (LIDODERM) 5 % Place 1 patch onto the skin every 12 (twelve) hours.     [name] lisdexamfetamine (VYVANSE) 70 MG capsule Take 1 capsule (70 mg total) by mouth daily. 30 capsule 0  . metolazone (ZAROXOLYN) 2.5 MG tablet Take 1 tablet (2.5 mg total) by mouth daily. (Patient taking differently: Take 2.5 mg by mouth 3 (three) times a week. ) 90 tablet 0  . Multiple Vitamin (MULTIVITAMIN WITH MINERALS) TABS tablet Take 1 tablet by mouth daily.    [name] omeprazole (PRILOSEC) 40 MG capsule Take 1 capsule (40 mg total) by mouth 2 (two) times daily. 180 capsule 3  . oxybutynin (DITROPAN-XL) 10 MG 24 hr tablet Take 1-2 tablets (10-20 mg total) by mouth at bedtime. (Patient taking differently: Take 10 mg by mouth 2 (two)  times daily. ) 60 tablet 1  . polyethylene glycol (MIRALAX / GLYCOLAX) 17 g packet Take 17 g by mouth daily as needed for mild constipation or moderate constipation. 14 each 11  . potassium chloride (K-DUR) 10 MEQ tablet Take 10 mEq by mouth 2 (two) times daily.    . prazosin (MINIPRESS) 2 MG capsule Take 1 capsule (2 mg total) by mouth 3 (three) times daily. 270 capsule 0  . torsemide (DEMADEX) 20 MG tablet Take 4 tablets (80 mg total) by mouth 2 (two) times daily. 240 tablet 4  . traZODone (DESYREL) 50 MG tablet Take 1 tablet (50 mg total) by mouth daily. 90 tablet 0  . budesonide-formoterol (SYMBICORT) 80-4.5 MCG/ACT inhaler Inhale 2 puffs into the lungs 2 (two) times daily. 1 Inhaler 2  . doxazosin (CARDURA) 2 MG tablet Take 1 tablet (2 mg total) by mouth daily. 30 tablet 0   PTA Medications: (Not in a hospital admission)   Musculoskeletal: Strength & Muscle Tone: within normal limits Gait & Station: normal Patient leans: N/A  Psychiatric Specialty Exam: Physical Exam Vitals and nursing note reviewed.  Constitutional:      Appearance: Normal appearance.  Pulmonary:     Effort: Pulmonary effort is normal.  Neurological:      Mental Status: [name] alert.  Psychiatric:        Attention and Perception: Attention and perception normal.        Mood and Affect: Mood normal.        Speech: Speech normal.        Behavior: Behavior normal.        Thought Content: Thought content normal.        Cognition and Memory: Cognition and memory normal.        Judgment: Judgment normal.     Review of Systems  Psychiatric/Behavioral: Negative for agitation, behavioral problems, confusion, hallucinations, self-injury, sleep disturbance and suicidal ideas. Nervous/anxious: Stable.   All other systems reviewed and are negative.   Blood pressure 139/90, pulse (!) 59, temperature 98.3 F (36.8 C), temperature source Oral, resp. rate 16, SpO2 98 %.There is no height or weight on file to calculate BMI.  General Appearance: Casual  Eye Contact:  Good  Speech:  Blocked and Normal Rate  Volume:  Normal  Mood:  "I'm fine, doing okay"  Affect:  Appropriate and Congruent  Thought Process:  Coherent, Goal Directed and Descriptions of Associations: Intact  Orientation:  Full (Time, Place, and Person)  Thought Content:  WDL and Logical  Suicidal Thoughts:  No  Homicidal Thoughts:  No  Memory:  Immediate;   Good Recent;   Good  Judgement:  Intact  Insight:  Present  Psychomotor Activity:  Normal  Concentration:  Concentration: Good and Attention Span: Good  Recall:  Good  Fund of Knowledge:  Fair  Language:  Good  Akathisia:  No  Handed:  Right  AIMS (if indicated):     Assets:  Communication Skills Desire for Improvement Housing Social Support  ADL's:  Intact  Cognition:  WNL  Sleep:        Demographic Factors:  NA  Loss Factors: NA  Historical Factors: Impulsivity  Risk Reduction Factors:   Religious beliefs about death, Living with another person, especially a relative, Positive social support and Positive therapeutic relationship  Continued Clinical Symptoms:  Previous Psychiatric Diagnoses and  Treatments  Cognitive Features That Contribute To Risk:  None    Suicide Risk:  Minimal: No identifiable suicidal ideation.  Patients presenting with no risk factors but with morbid ruminations; may be classified as minimal risk based on the severity of the depressive symptoms    Plan Of Care/Follow-up recommendations:  Activity:  As tolerated Diet:  Elnoria Howard healthy, Carb modified    Discharge Instructions     For your behavioral health needs, you are advised to continue treatment with your current outpatient providers.    Disposition:  Patient psychiatrically cleared No evidence of imminent risk to self or others at present.   Patient does not meet criteria for psychiatric inpatient admission. Supportive therapy provided about ongoing stressors. Discussed crisis plan, support from social network, calling 911, coming to the Emergency Department, and calling Suicide Hotline.   Spoke with Dr. Erenest Blank informed of recommendations and disposition  Deeandra Jerry, NP 02/04/2019, 9:28 AM

## 2019-02-04 NOTE — Progress Notes (Signed)
Virtual Visit via Telephone Note  I connected with Tiffany [name] on 02/04/19 at  2:00 PM EST by a video enabled telemedicine application and verified that I am speaking with the correct person using two identifiers.  Location: Patient: Tiffany [name] Provider: Lise Auer, LCSW   I discussed the limitations of evaluation and management by telemedicine and the availability of in person appointments. The patient expressed understanding and agreed to proceed.  History of Present Illness: Schizoaffective Disorder, Bipolar Type and Chronic PTSD   Observations/Objective: Counselor connected with Tiffany [name] for individual therapy via telephone, however, she was in the middle of a crisis, reporting that her mother attempted to have her admitted to the hospital. Tiffany [name] was discharged from ED to return to her mother's home where she has been residing for the past week, to find her medications missing. Tiffany [name] reports and believes that her mother stole her medications. Tiffany [name] was distraught due to her need for medications related to her medical conditions, it re-triggered traumatic experiences with her mother, makes her feel unsafe, and left with being homeless once again. Tiffany [name] reported going to [facility] to ask for help, this is where she was during our call. [facility] Staff, Gayland Curry and Nickie Retort helped to communicate with Counselor about what they are able to do to assist, as well as notifying that Tiffany [name] has an intake appointment with their practice on Feb 2nd. Tiffany [name] decided that she would like to contact the police to report missing medications, she would like to meet the police at the home for her safety, she will contact [facility] and [address] to find alternative housing and Counselor will contact medical providers to make them aware of the situation and the potential need for replacement of medications. Counselor established a follow up appointment for 02/24/19 @ 3 and will have providers reach  out for follow up as well.   Assessment and Plan: Counselor will continue to meet with patient to address treatment plan goals. Patient will continue to follow recommendations of providers and implement skills learned in session.  Follow Up Instructions: Counselor will send information for next session via telephone.    I discussed the assessment and treatment plan with the patient. The patient was provided an opportunity to ask questions and all were answered. The patient agreed with the plan and demonstrated an understanding of the instructions.   The patient was advised to call back or seek an in-person evaluation if the symptoms worsen or if the condition fails to improve as anticipated.  I provided 28 minutes of non-face-to-face time during this encounter.   Lise Auer, LCSW

## 2019-02-04 NOTE — BH Assessment (Signed)
[place] Assessment Progress Note  Per Shuvon Rankin, FNP, this pt does not require psychiatric hospitalization at this time.  Pt presents under IVC initiated by pt's mother and upheld by EDP Lennice Sites, DO.  Pt is to be discharged from [facility] with recommendation to continue treatment with her current providers.  This has been included in pt's discharge instructions.  Pt's nurse, Nena Jordan, has been notified.  Jalene Mullet, [place] Triage Specialist [phone]

## 2019-02-04 NOTE — ED Notes (Addendum)
Patient is taking extremely long to dress for discharge. Called patients mom to see if she would pick up patient.  She will be here in 20 minutes.

## 2019-02-04 NOTE — ED Notes (Signed)
Pt is discharged and discharge instructions were reviewed.  All belongings were returned.  She signed for her money and counted it.  All Belongings Are Accounted for.

## 2019-02-04 NOTE — Discharge Instructions (Signed)
For your behavioral health needs, you are advised to continue treatment with your current outpatient providers.

## 2019-02-05 ENCOUNTER — Other Ambulatory Visit: Payer: Self-pay | Admitting: Registered Nurse

## 2019-02-05 DIAGNOSIS — N3281 Overactive bladder: Secondary | ICD-10-CM

## 2019-02-17 ENCOUNTER — Telehealth (HOSPITAL_COMMUNITY): Payer: Self-pay | Admitting: *Deleted

## 2019-02-17 ENCOUNTER — Other Ambulatory Visit (HOSPITAL_COMMUNITY): Payer: Self-pay | Admitting: Psychiatry

## 2019-02-17 MED ORDER — LISDEXAMFETAMINE DIMESYLATE 70 MG PO CAPS: 70.0000 mg | ORAL_CAPSULE | Freq: Every day | ORAL | 0 refills | Status: DC

## 2019-02-17 NOTE — Telephone Encounter (Signed)
Refill for Vyvanse send to the last pharmacy on record. Problem with [facility] is that she keeps on changing her pharmacies so I never know where a refill needs to go.

## 2019-02-17 NOTE — Telephone Encounter (Signed)
Writer spoke with pt who called requesting refills on Valium and VyVanse. Writer informed pt that there is a refill of the Diazepam at her pharmacy and that I would send message regarding VyVanse refill. Pt did not have an upcoming appointment and states that she missed her last visit on 02/02/18. Writer transferred pt to appointment desk for future appointment to be made.

## 2019-02-19 ENCOUNTER — Other Ambulatory Visit: Payer: Self-pay

## 2019-02-19 ENCOUNTER — Ambulatory Visit (HOSPITAL_COMMUNITY): Payer: Medicare HMO | Admitting: Psychiatry

## 2019-02-24 ENCOUNTER — Telehealth: Payer: Self-pay | Admitting: Pulmonary Disease

## 2019-02-24 ENCOUNTER — Other Ambulatory Visit: Payer: Self-pay

## 2019-02-24 ENCOUNTER — Ambulatory Visit (INDEPENDENT_AMBULATORY_CARE_PROVIDER_SITE_OTHER): Payer: Medicare HMO | Admitting: Psychiatry

## 2019-02-24 DIAGNOSIS — F250 Schizoaffective disorder, bipolar type: Secondary | ICD-10-CM

## 2019-02-24 DIAGNOSIS — F4312 Post-traumatic stress disorder, chronic: Secondary | ICD-10-CM

## 2019-02-24 NOTE — Telephone Encounter (Signed)
FYI: Due to multiple unsuccessful attempts to contact pt to schedule lung cancer screening, this referral has been cancelled. .  

## 2019-02-24 NOTE — Progress Notes (Signed)
Virtual Visit via Video Note  I connected with Tiffany [name] on 02/24/19 at  3:00 PM EST by a video enabled telemedicine application and verified that I am speaking with the correct person using two identifiers.  Location: Patient: Patient Home Provider: Home Office   I discussed the limitations of evaluation and management by telemedicine and the availability of in person appointments. The patient expressed understanding and agreed to proceed.  History of Present Illness: Schizoeffective DO Bipolar Type and Chronic PTSD   Treatment Plan Goals: Tiffany [name] would like to process truama and adverse life experiences in the context of therapy in order to heal and develop healthy coping strategies to decrease truama triggers, responses, anxiety and depression.  Observations/Objective: Counselor met with Tiffany [name] for individual therapy via Webex. Counselor assessed MH symptoms and progress on treatment plan goals, with patient reporting that she was currently in transition from one hotel stay to the next, triggered by her adult son who was set to assist with the move, but showed up angry, aggressive and unstable. Tiffany [name] presents with severe depression and severe anxiety, with active trauma triggers present. Tiffany [name] denied suicidal ideation or self-harm behaviors.   Tiffany [name] shared that she was very distressed at her living situation, as her housing and finances are very unstable, making it difficult to engage in her mental health and physical health treatment. She noted that she is in need of case management, but is unable to establish services due to constant transitions. Counselor validated her feelings and utilized empathetic listening skills. Counselor promoted Tiffany [name] to take deep breathes to regulate mind and body. Tiffany [name] responded positively to prompts, however, she became triggered additional times during the session by her son and hotel staff who were assisting in her move. Sakoya discussed concerns about upcoming  court hearings and her desire to move out of state to start over/have more distance between her and her triggers/family. She expressed concern with lack of finances. Tiffany [name] confirmed that she is taking medications as prescribed and has access to her oxygen tank. Tiffany [name] ended the session early due to being in the process of moving.   Assessment and Plan: Counselor will be transferring Tiffany [name] to a [employer] while on maternity leave. Counselor will work to coordinate care for more intensive services and case management. Tiffany [name] will continue to keep the office updated on her current contact information.  Patient will continue to follow recommendations of providers and implement skills learned in session.  Follow Up Instructions: Counselor will communicate needs with new provider.    The patient was advised to call back or seek an in-person evaluation if the symptoms worsen or if the condition fails to improve as anticipated.  I provided 35 minutes of non-face-to-face time during this encounter.   Lise Auer, LCSW

## 2019-02-24 NOTE — Telephone Encounter (Signed)
Thank you for letting me know.  Thank you for trying to get the patient scheduled.  Wyn Quaker, FNP

## 2019-02-25 ENCOUNTER — Encounter (HOSPITAL_COMMUNITY): Payer: Self-pay | Admitting: Psychiatry

## 2019-02-25 ENCOUNTER — Other Ambulatory Visit: Payer: Self-pay

## 2019-02-25 ENCOUNTER — Ambulatory Visit (HOSPITAL_COMMUNITY): Payer: Medicare HMO | Admitting: Psychiatry

## 2019-02-26 ENCOUNTER — Ambulatory Visit (INDEPENDENT_AMBULATORY_CARE_PROVIDER_SITE_OTHER): Payer: Medicare HMO | Admitting: Psychiatry

## 2019-02-26 ENCOUNTER — Other Ambulatory Visit: Payer: Self-pay

## 2019-02-26 DIAGNOSIS — F250 Schizoaffective disorder, bipolar type: Secondary | ICD-10-CM

## 2019-02-26 DIAGNOSIS — F5081 Binge eating disorder: Secondary | ICD-10-CM

## 2019-02-26 DIAGNOSIS — F4312 Post-traumatic stress disorder, chronic: Secondary | ICD-10-CM | POA: Diagnosis not present

## 2019-02-26 MED ORDER — TRAZODONE HCL 100 MG PO TABS: 100.0000 mg | ORAL_TABLET | Freq: Every day | ORAL | 0 refills | Status: DC

## 2019-02-26 MED ORDER — LISDEXAMFETAMINE DIMESYLATE 70 MG PO CAPS: 70.0000 mg | ORAL_CAPSULE | Freq: Every day | ORAL | 0 refills | Status: DC
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
## 2019-03-17 ENCOUNTER — Ambulatory Visit (HOSPITAL_COMMUNITY): Payer: Medicare HMO | Admitting: Psychiatry

## 2019-03-18 ENCOUNTER — Other Ambulatory Visit: Payer: Self-pay

## 2019-03-18 ENCOUNTER — Ambulatory Visit (HOSPITAL_COMMUNITY): Payer: Medicare HMO | Admitting: Psychiatry

## 2019-03-19 IMAGING — DX DG CHEST 2V
2 series · 2 of 2 positions shown · non-contrast
Comparison: 11/24/2015

CLINICAL DATA: Shortness of breath, cough, worsening

EXAM:
CHEST  2 VIEW

[dg chest 2 view (1 of 2)]
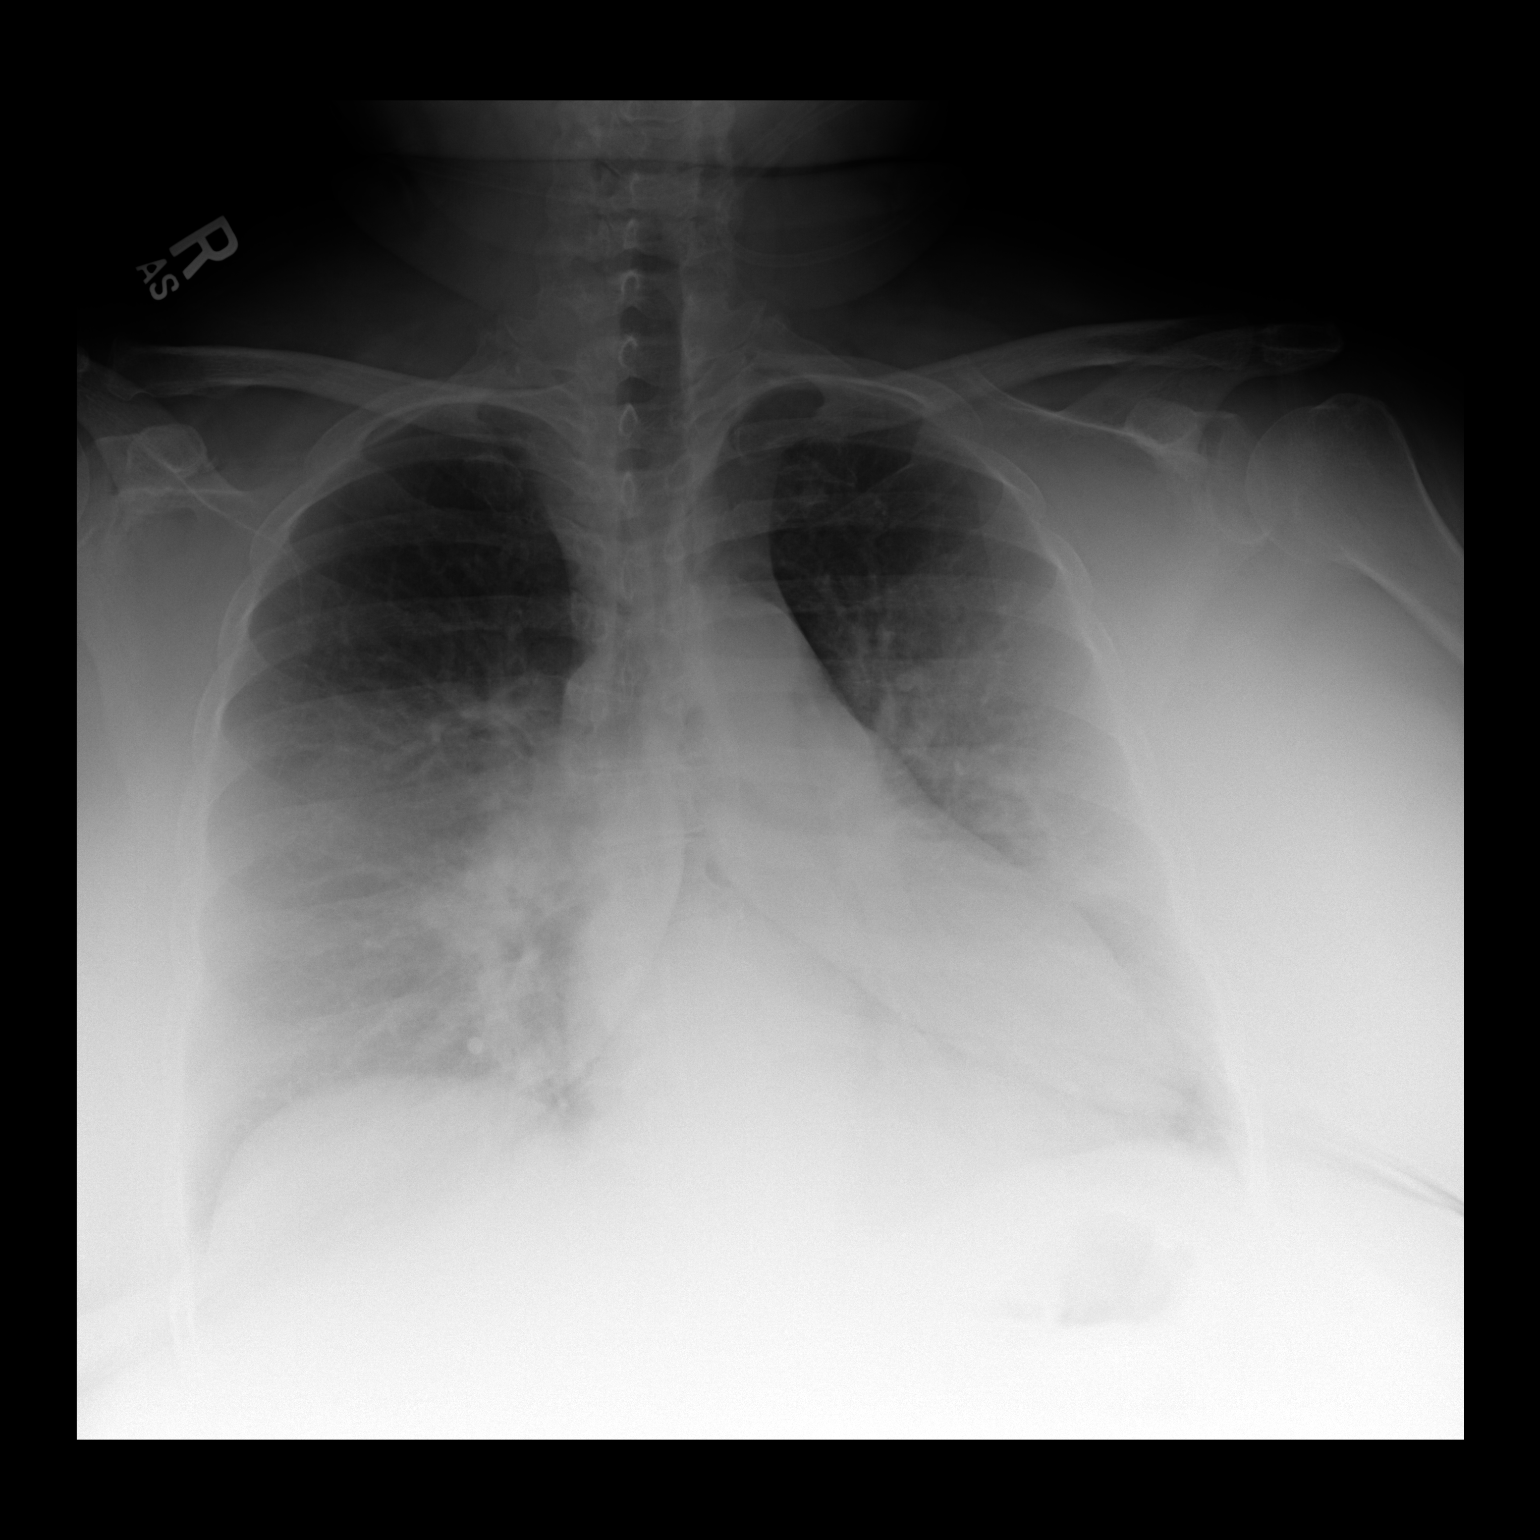

[dg chest 2 view (2 of 2)]
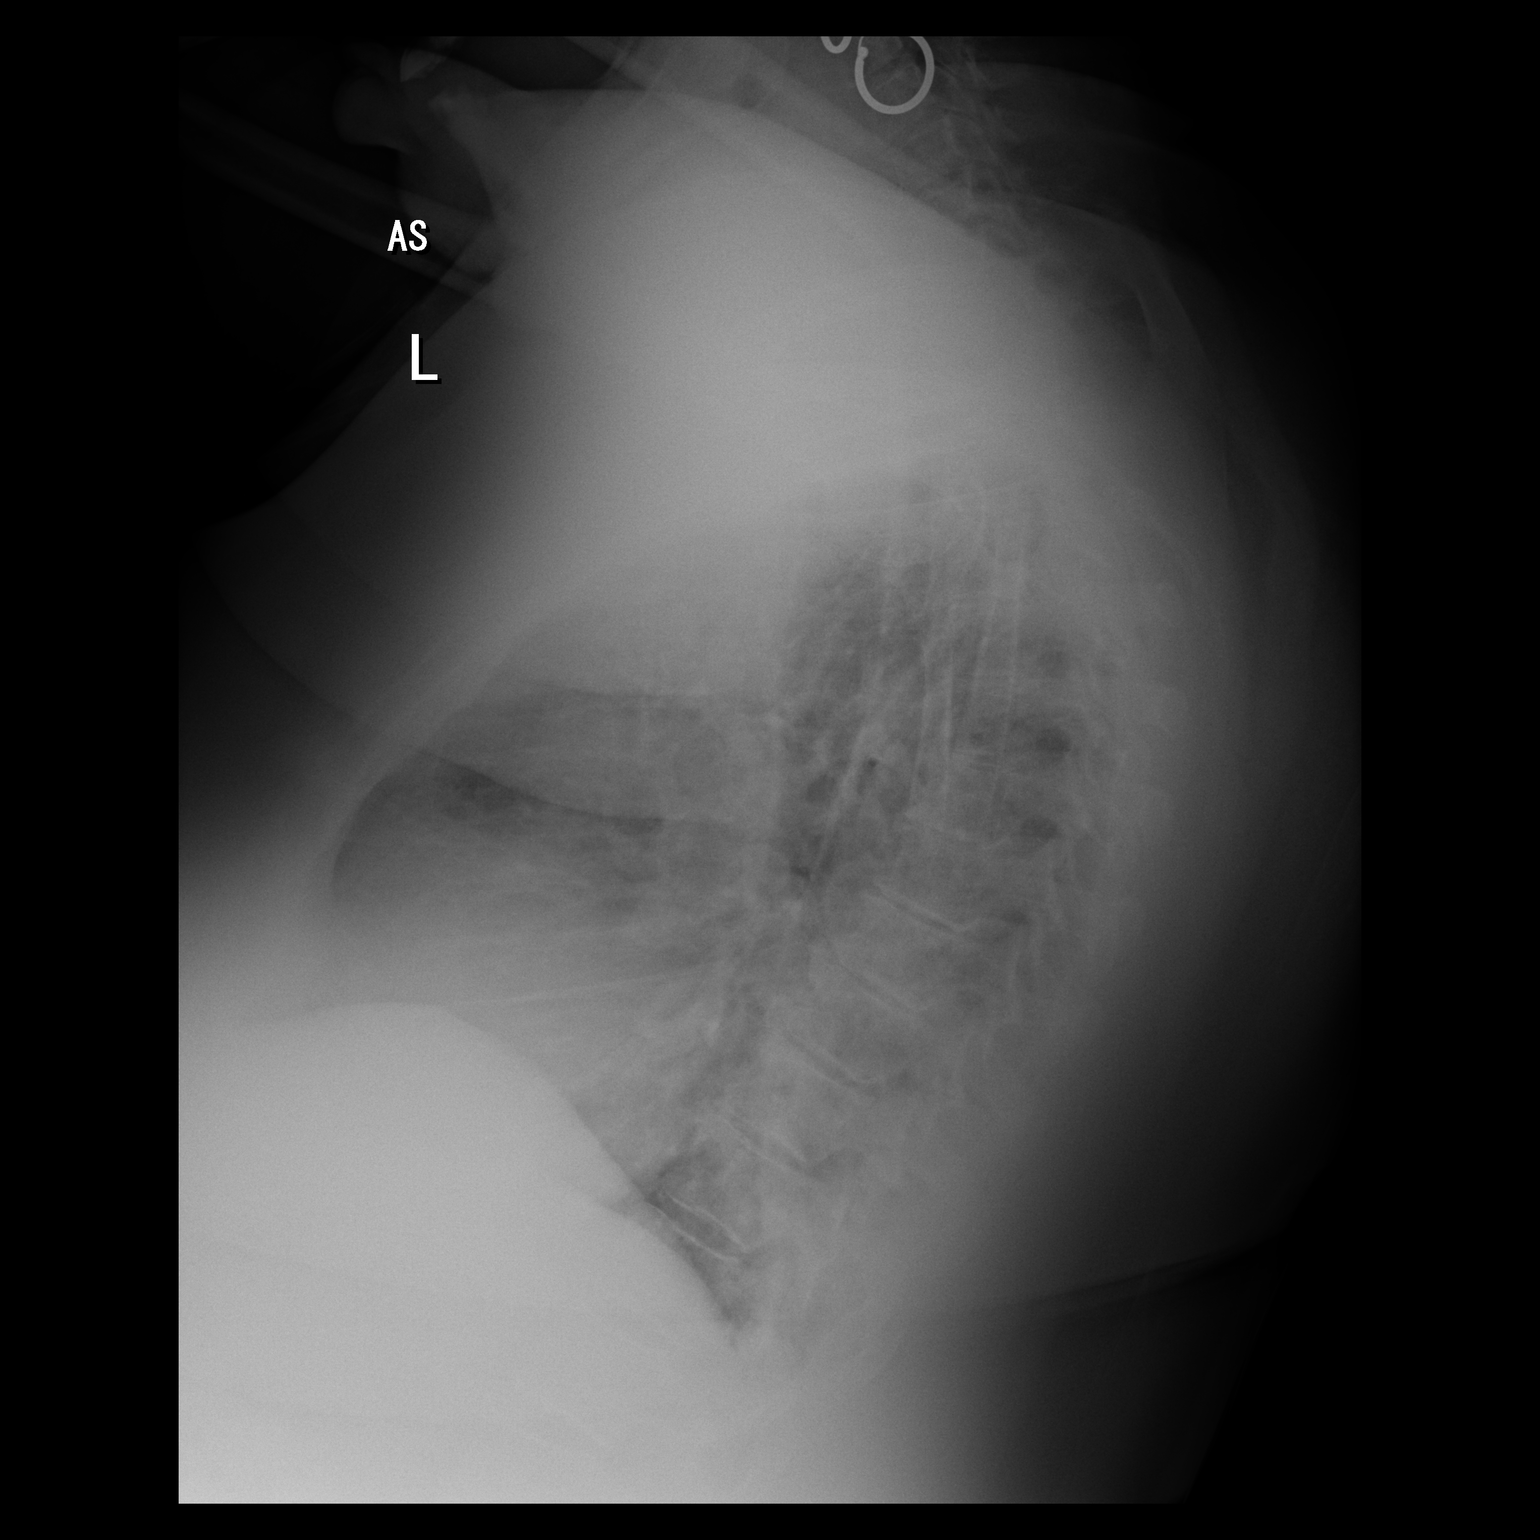

[2 of 2 positions shown; findings below may reference images not displayed]

FINDINGS: Mild cardiomegaly. Bilateral perihilar airspace opacities and
vascular congestion. No effusions. No acute bony abnormality.
IMPRESSION: Vascular congestion with bilateral perihilar airspace opacities most
compatible with edema/ CHF.

## 2019-03-20 ENCOUNTER — Other Ambulatory Visit: Payer: Self-pay

## 2019-03-20 ENCOUNTER — Encounter (HOSPITAL_COMMUNITY): Payer: Self-pay | Admitting: Emergency Medicine

## 2019-03-20 DIAGNOSIS — M79604 Pain in right leg: Secondary | ICD-10-CM | POA: Diagnosis present

## 2019-03-20 DIAGNOSIS — G894 Chronic pain syndrome: Secondary | ICD-10-CM | POA: Insufficient documentation

## 2019-03-20 DIAGNOSIS — Z7984 Long term (current) use of oral hypoglycemic drugs: Secondary | ICD-10-CM | POA: Insufficient documentation

## 2019-03-20 DIAGNOSIS — E1140 Type 2 diabetes mellitus with diabetic neuropathy, unspecified: Secondary | ICD-10-CM | POA: Insufficient documentation

## 2019-03-20 DIAGNOSIS — M25571 Pain in right ankle and joints of right foot: Secondary | ICD-10-CM | POA: Diagnosis not present

## 2019-03-20 DIAGNOSIS — F250 Schizoaffective disorder, bipolar type: Secondary | ICD-10-CM | POA: Insufficient documentation
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
## 2019-03-21 ENCOUNTER — Emergency Department (HOSPITAL_COMMUNITY): Payer: Medicare HMO

## 2019-03-21 ENCOUNTER — Ambulatory Visit (HOSPITAL_COMMUNITY)
Admission: RE | Admit: 2019-03-21 | Discharge: 2019-03-21 | Disposition: A | Discharge status: Home / Self Care | Payer: Medicare HMO | Source: Home / Self Care | Attending: Psychiatry | Admitting: Psychiatry

## 2019-03-21 ENCOUNTER — Emergency Department (HOSPITAL_COMMUNITY)
Admission: EM | Admit: 2019-03-21 | Discharge: 2019-03-21 | Disposition: A | Discharge status: Home / Self Care | Payer: Medicare HMO | Attending: Emergency Medicine | Admitting: Emergency Medicine

## 2019-03-21 DIAGNOSIS — F4310 Post-traumatic stress disorder, unspecified: Secondary | ICD-10-CM | POA: Diagnosis present

## 2019-03-21 DIAGNOSIS — M79604 Pain in right leg: Secondary | ICD-10-CM | POA: Diagnosis not present

## 2019-03-21 DIAGNOSIS — F250 Schizoaffective disorder, bipolar type: Secondary | ICD-10-CM | POA: Diagnosis present

## 2019-03-21 DIAGNOSIS — M79606 Pain in leg, unspecified: Secondary | ICD-10-CM

## 2019-03-21 LAB — I-STAT CHEM 8, ED
BUN: 18 mg/dL (ref 6–20)
Calcium, Ion: 1.12 mmol/L — ABNORMAL LOW (ref 1.15–1.40)
Chloride: 99 mmol/L (ref 98–111)
Creatinine, Ser: 1.3 mg/dL — ABNORMAL HIGH (ref 0.44–1.00)
Glucose, Bld: 86 mg/dL (ref 70–99)
HCT: 33 % — ABNORMAL LOW (ref 36.0–46.0)
Hemoglobin: 11.2 g/dL — ABNORMAL LOW (ref 12.0–15.0)
Potassium: 3.1 mmol/L — ABNORMAL LOW (ref 3.5–5.1)
Sodium: 139 mmol/L (ref 135–145)
TCO2: 31 mmol/L (ref 22–32)
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
## 2019-03-21 NOTE — Progress Notes (Signed)
[name] coordinated transportation for the patient to return home. Ride will be here within 20 minutes. [name] provided RN phone number.   Domenic Schwab, MSW, LCSW-A Clinical Disposition Social Worker [facility]/TTS [phone]

## 2019-03-21 NOTE — ED Notes (Signed)
Patient refused to get dressed multiple times, stated that the doctor took too long and she was going to miss her court date again. Also said she would call a taxi and her psychiatrist. Pateint spoke to charge RN and security who also informed her of her DC papers. Eventually patient escorted out to the lobby to use a phone to call a ride.

## 2019-03-21 NOTE — ED Notes (Signed)
Informed BH that patient is avoiding court and might show up requesting admission
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
## 2019-03-21 NOTE — H&P (Signed)
Behavioral Health Medical Screening Exam  Tiffany [name] is an 59 y.o. female. Patient presents to [facility] behavioral health voluntarily for walk-in assessment.  Patient complains "I do not make enough money to support myself and I am trying to get set up through the legal system to get a 1 bedroom or 2 bedroom apartment." Patient assessed by nurse practitioner along with Dr. Dorris Fetch.  Patient tearful during assessment.  Patient denies suicidal ideations.  Patient denies history of suicide attempts, denies history of self-harm.  Patient endorses chronic homicidal ideations.  Patient states "I know I would never hurt my mother, that is why when I get my money I go stay at the hotel."  Patient denies auditory and visual hallucinations.  Patient denies symptoms of paranoia. Patient reports she currently resides with her mother however spends nights at hotel when she has financial resources to do so.  Patient stressors include strained relationship with mother as well as "my son is gay and he is going with a boy who is also gay named Tiffany [name]." Patient currently seen outpatient at [facility] behavioral health by Dr. Mamie Nick, also receives therapy outpatient [place] [facility] with [facility].  Patient reports compliance with medications. Patient states "I do not want to go home tonight." Patient seen at [facility] long ED this morning.  Patient reports court date today, patient states she did not attend court today but she called to notify the court. Patient reports failure to take her blood pressure medications today, discussed this with patient and she will take her home medications.  Discussed blood pressure with emergency department physician who recommends continue home medications.  Total Time spent with patient: 45 minutes  Psychiatric Specialty Exam: Physical Exam  Nursing note and vitals reviewed. Constitutional: [name] oriented to person, place, and time. She appears well-developed.  HENT:  Head:  Normocephalic.  Cardiovascular: Normal rate.  Respiratory: Effort normal.  Neurological: [name] alert and oriented to person, place, and time.  Psychiatric: Her speech is normal and behavior is normal. Judgment normal. Her mood appears anxious. Cognition and memory are normal. She expresses homicidal ideation.    Review of Systems  Constitutional: Negative.   HENT: Negative.   Eyes: Negative.   Respiratory: Negative.   Cardiovascular: Negative.   Gastrointestinal: Negative.   Genitourinary: Negative.   Musculoskeletal: Positive for gait problem (pateint uses cane).  Skin: Negative.   Psychiatric/Behavioral: The patient is nervous/anxious.     Blood pressure (!) 164/132, pulse 91, temperature 98.1 F (36.7 C), temperature source Oral, resp. rate 18, SpO2 100 %.There is no height or weight on file to calculate BMI.  General Appearance: Casual and Fairly Groomed  Eye Contact:  Good  Speech:  Clear and Coherent and Normal Rate  Volume:  Normal  Mood:  Anxious  Affect:  Congruent and Tearful  Thought Process:  Coherent, Goal Directed and Descriptions of Associations: Intact  Orientation:  Full (Time, Place, and Person)  Thought Content:  WDL and Logical  Suicidal Thoughts:  No  Homicidal Thoughts:  Yes.  without intent/plan  Memory:  Immediate;   Good Recent;   Good Remote;   Good  Judgement:  Good  Insight:  Fair  Psychomotor Activity:  Normal  Concentration: Concentration: Good and Attention Span: Good  Recall:  Good  Fund of Knowledge:Good  Language: Good  Akathisia:  No  Handed:  Right  AIMS (if indicated):     Assets:  Communication Skills Desire for Improvement Financial Resources/Insurance Housing Intimacy Leisure  Time Physical Health Resilience Social Support Talents/Skills Transportation  Sleep:       Musculoskeletal: Strength & Muscle Tone: within normal limits Gait & Station: normal Patient leans: N/A  Blood pressure (!) 164/132, pulse 91,  temperature 98.1 F (36.7 C), temperature source Oral, resp. rate 18, SpO2 100 %.  Recommendations: Recommend follow-up with established outpatient provider, [facility].  Based on my evaluation the patient does not appear to have an emergency medical condition.  Emmaline Kluver, FNP 03/21/2019, 4:41 PM

## 2019-03-24 IMAGING — CR DG ABDOMEN 1V
2 series · 2 of 2 positions shown · non-contrast
Comparison: None

CLINICAL DATA: Constipation for 1 month.

EXAM:
ABDOMEN - 1 VIEW

[abdomen kub (1 of 2)]
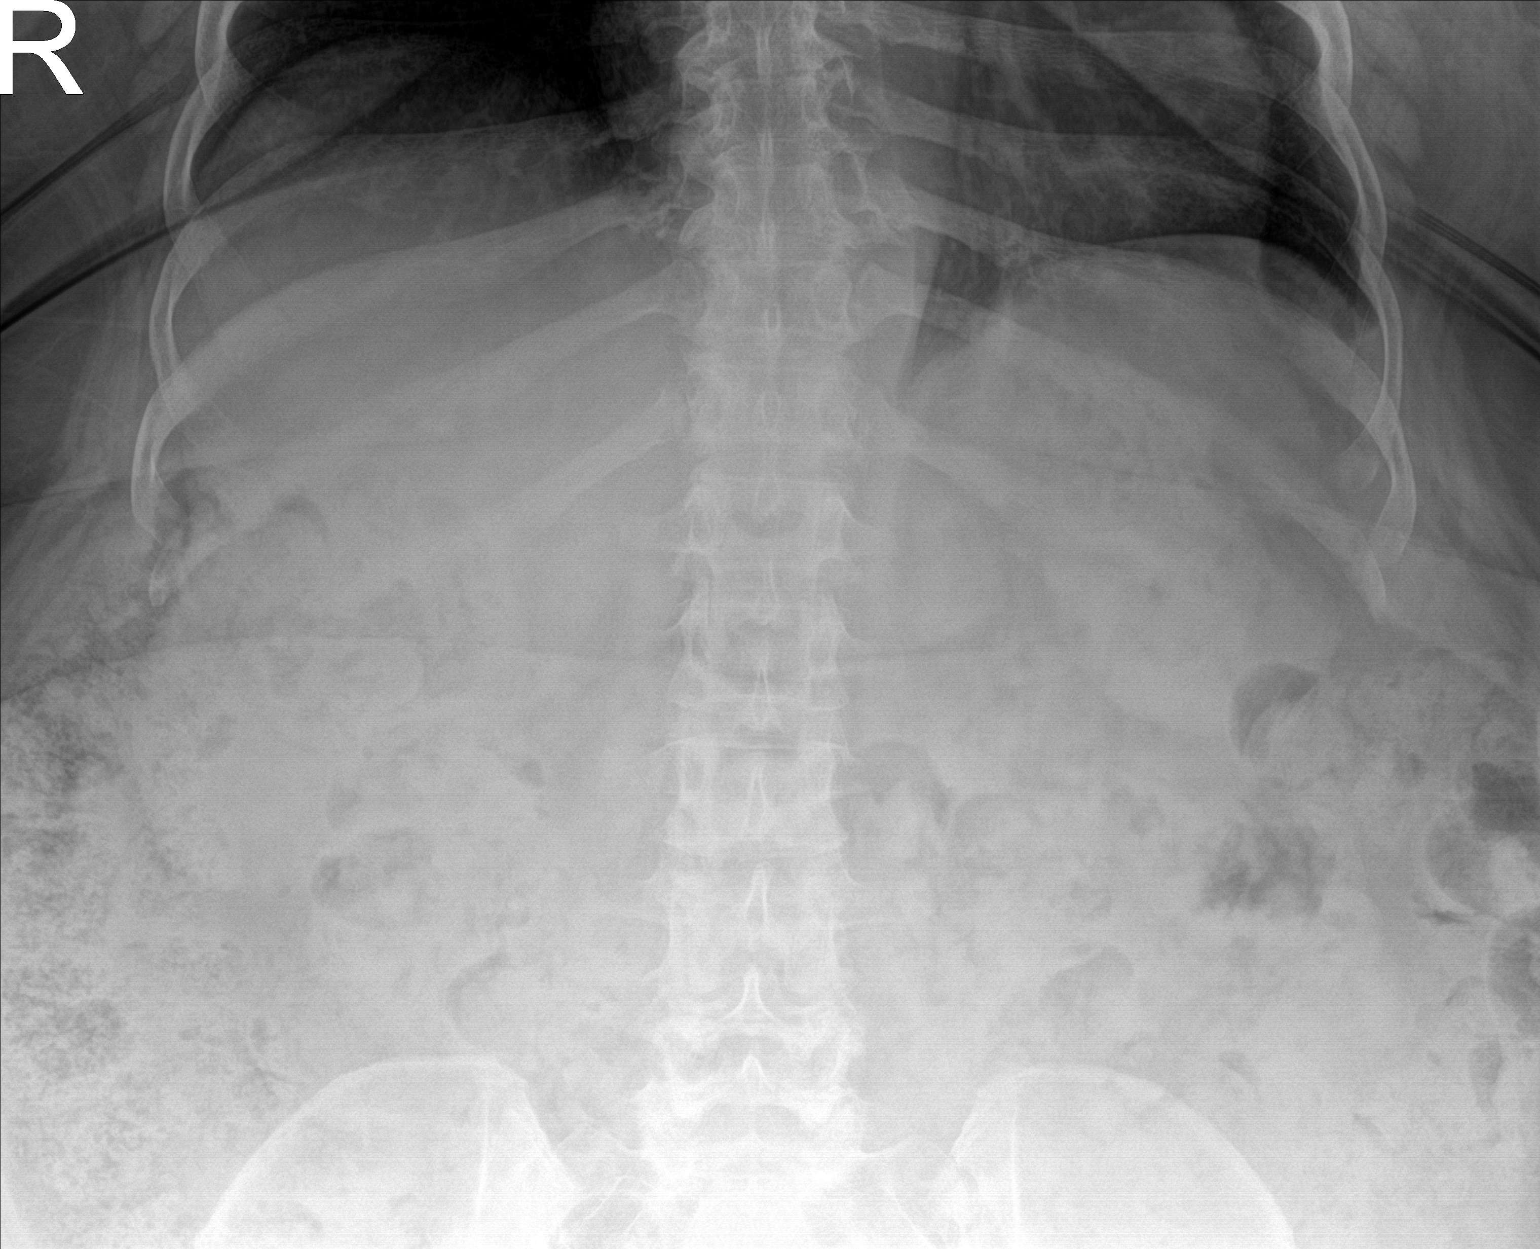

[abdomen kub (2 of 2)]
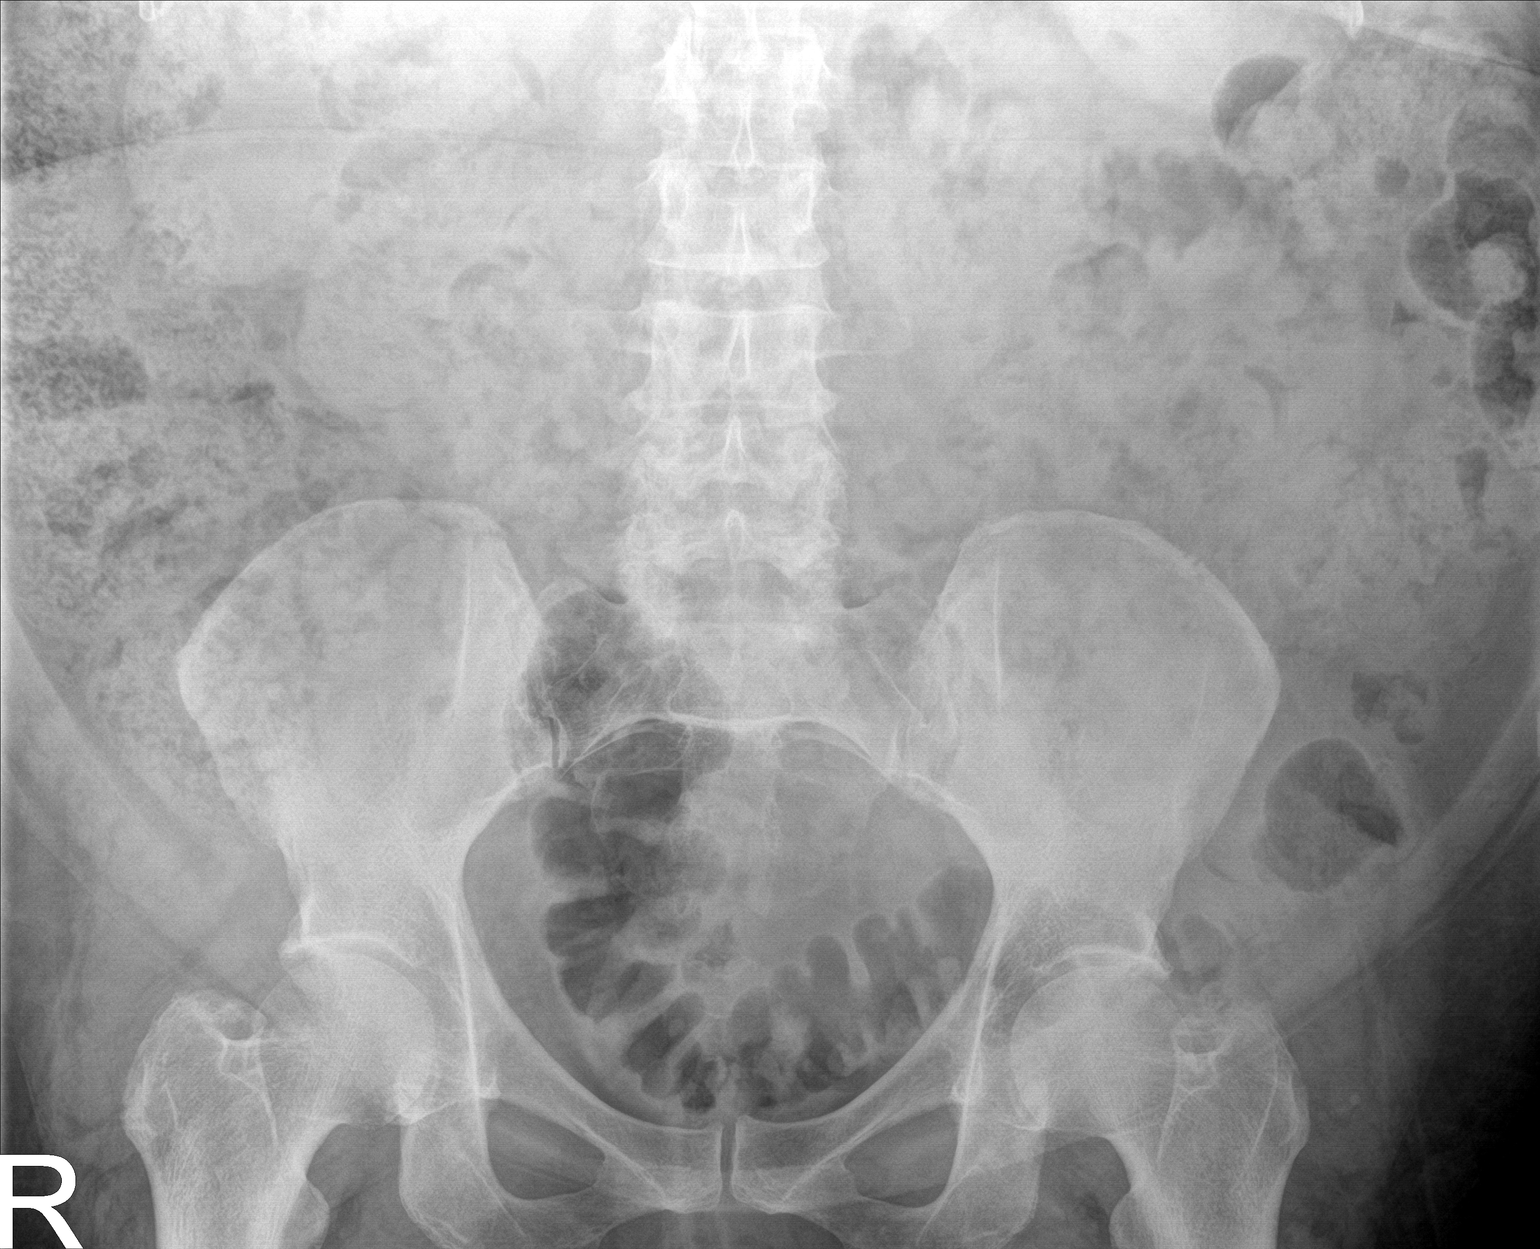

[2 of 2 positions shown; findings below may reference images not displayed]

FINDINGS: There is a marked stool burden identified throughout the colon. No
dilated loops of small bowel identified. No abnormal abdominal or
pelvic calcifications.
IMPRESSION: 1. Marked stool burden identified within the colon compatible with
the clinical history of constipation.

## 2019-03-24 IMAGING — CR DG CHEST 2V
2 series · 2 of 2 positions shown · non-contrast
Comparison: April 05, 2016

CLINICAL DATA: Chronic shortness of Breath

EXAM:
CHEST  2 VIEW

[chest pa]
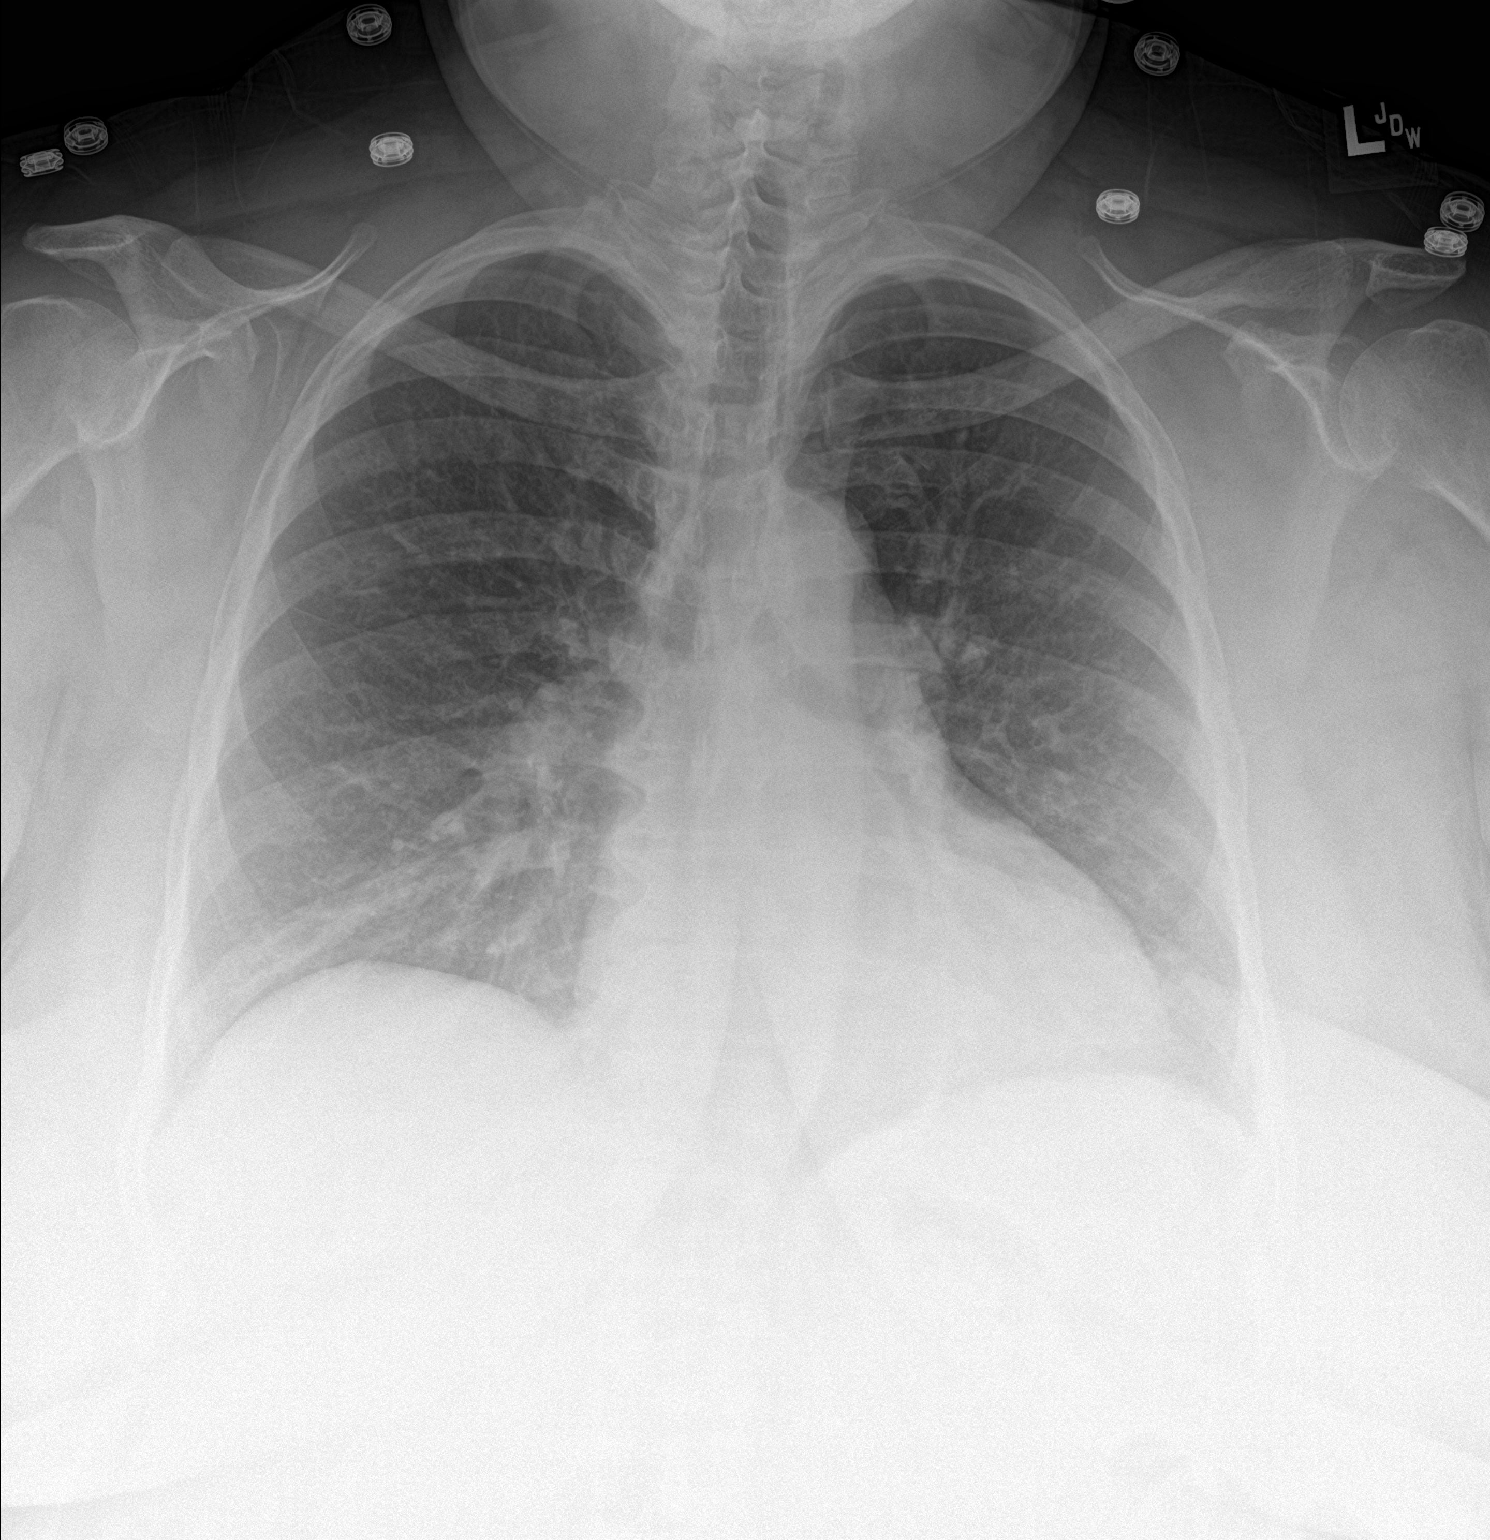

[chest lat]
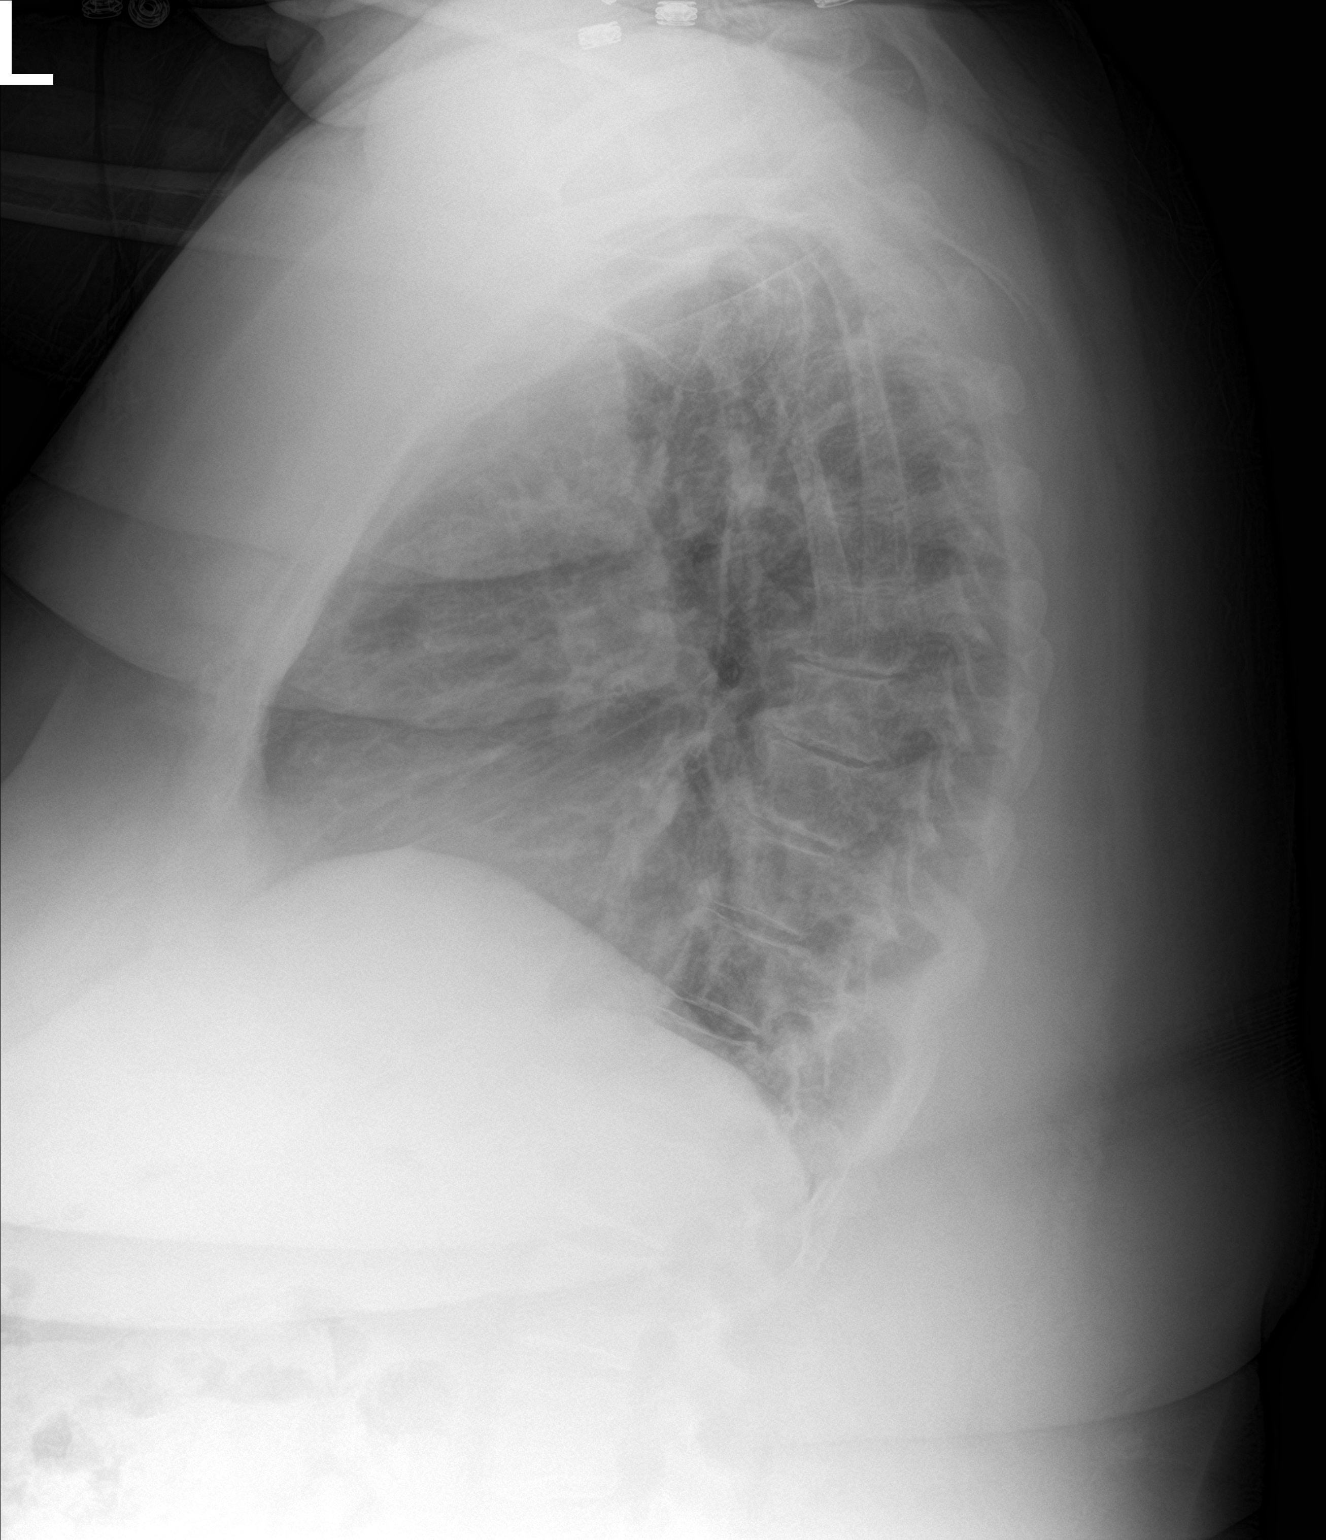

[2 of 2 positions shown; findings below may reference images not displayed]

FINDINGS: There is no edema or consolidation. Heart is borderline enlarged
with pulmonary vascularity within normal limits. No adenopathy.
There is degenerative change in thoracic spine.
IMPRESSION: Borderline cardiac enlargement.  No edema or consolidation.

## 2019-03-26 ENCOUNTER — Other Ambulatory Visit (HOSPITAL_COMMUNITY): Payer: Self-pay | Admitting: Psychiatry

## 2019-03-26 ENCOUNTER — Telehealth (HOSPITAL_COMMUNITY): Payer: Self-pay | Admitting: *Deleted

## 2019-03-26 MED ORDER — TRAZODONE HCL 100 MG PO TABS: 100.0000 mg | ORAL_TABLET | Freq: Every day | ORAL | 0 refills | Status: DC

## 2019-03-26 MED ORDER — ESCITALOPRAM OXALATE 20 MG PO TABS: 40.0000 mg | ORAL_TABLET | Freq: Every day | ORAL | 0 refills | Status: DC

## 2019-03-26 MED ORDER — TRAZODONE HCL 50 MG PO TABS: 50.0000 mg | ORAL_TABLET | Freq: Every day | ORAL | 0 refills | Status: DC

## 2019-03-26 MED ORDER — DIAZEPAM 10 MG PO TABS: 10.0000 mg | ORAL_TABLET | Freq: Three times a day (TID) | ORAL | 2 refills | Status: DC

## 2019-03-26 MED ORDER — ASENAPINE MALEATE 10 MG SL SUBL: 10.0000 mg | SUBLINGUAL_TABLET | Freq: Two times a day (BID) | SUBLINGUAL | 0 refills | Status: DC

## 2019-03-26 MED ORDER — LISDEXAMFETAMINE DIMESYLATE 70 MG PO CAPS: 70.0000 mg | ORAL_CAPSULE | Freq: Every day | ORAL | 0 refills | Status: DC

## 2019-03-26 NOTE — Telephone Encounter (Signed)
I told her they would be sent to [facility] [place]. At one point she said not that one, another [facility], but was not able to tell me which one. She's used this pharmacy, [facility]  before.
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
## 2019-03-26 NOTE — Telephone Encounter (Signed)
I will gladly send refills but I am not sure to which pharmacy - she had changed them almost with every visit and per Epic some meds were send to one , others to another?

## 2019-03-26 NOTE — Telephone Encounter (Signed)
Done

## 2019-03-30 ENCOUNTER — Emergency Department (HOSPITAL_COMMUNITY): Payer: Medicare HMO

## 2019-03-30 ENCOUNTER — Other Ambulatory Visit: Payer: Self-pay

## 2019-03-30 ENCOUNTER — Emergency Department (HOSPITAL_COMMUNITY)
Admission: EM | Admit: 2019-03-30 | Discharge: 2019-03-30 | Disposition: A | Discharge status: Home / Self Care | Payer: Medicare HMO | Attending: Emergency Medicine | Admitting: Emergency Medicine

## 2019-03-30 ENCOUNTER — Encounter (HOSPITAL_COMMUNITY): Payer: Self-pay | Admitting: *Deleted

## 2019-03-30 DIAGNOSIS — E119 Type 2 diabetes mellitus without complications: Secondary | ICD-10-CM | POA: Diagnosis not present

## 2019-03-30 DIAGNOSIS — Z87891 Personal history of nicotine dependence: Secondary | ICD-10-CM | POA: Insufficient documentation

## 2019-03-30 DIAGNOSIS — R1012 Left upper quadrant pain: Secondary | ICD-10-CM | POA: Diagnosis present

## 2019-03-30 DIAGNOSIS — I5032 Chronic diastolic (congestive) heart failure: Secondary | ICD-10-CM | POA: Insufficient documentation

## 2019-03-30 DIAGNOSIS — F4312 Post-traumatic stress disorder, chronic: Secondary | ICD-10-CM | POA: Diagnosis not present

## 2019-03-30 DIAGNOSIS — F250 Schizoaffective disorder, bipolar type: Secondary | ICD-10-CM | POA: Diagnosis not present

## 2019-03-30 DIAGNOSIS — N183 Chronic kidney disease, stage 3 unspecified: Secondary | ICD-10-CM | POA: Insufficient documentation

## 2019-03-30 DIAGNOSIS — R0602 Shortness of breath: Secondary | ICD-10-CM | POA: Diagnosis not present

## 2019-03-30 DIAGNOSIS — F99 Mental disorder, not otherwise specified: Secondary | ICD-10-CM | POA: Insufficient documentation

## 2019-03-30 DIAGNOSIS — I130 Hypertensive heart and chronic kidney disease with heart failure and stage 1 through stage 4 chronic kidney disease, or unspecified chronic kidney disease: Secondary | ICD-10-CM | POA: Insufficient documentation

## 2019-03-30 DIAGNOSIS — Z79899 Other long term (current) drug therapy: Secondary | ICD-10-CM | POA: Insufficient documentation

## 2019-03-30 LAB — CBC
HCT: 36.6 % (ref 36.0–46.0)
Hemoglobin: 11 g/dL — ABNORMAL LOW (ref 12.0–15.0)
MCH: 27.4 pg (ref 26.0–34.0)
MCHC: 30.1 g/dL (ref 30.0–36.0)
MCV: 91 fL (ref 80.0–100.0)
Platelets: 391 10*3/uL (ref 150–400)
RBC: 4.02 MIL/uL (ref 3.87–5.11)
RDW: 14.9 % (ref 11.5–15.5)
WBC: 6.8 10*3/uL (ref 4.0–10.5)
nRBC: 0 % (ref 0.0–0.2)

## 2019-03-30 LAB — TROPONIN I (HIGH SENSITIVITY): Troponin I (High Sensitivity): 9 ng/L (ref <18)

## 2019-03-30 LAB — BASIC METABOLIC PANEL
Anion gap: 10 (ref 5–15)
BUN: 14 mg/dL (ref 6–20)
CO2: 25 mmol/L (ref 22–32)
Calcium: 9.5 mg/dL (ref 8.9–10.3)
Chloride: 106 mmol/L (ref 98–111)
Creatinine, Ser: 1.48 mg/dL — ABNORMAL HIGH (ref 0.44–1.00)
GFR calc Af Amer: 44 mL/min — ABNORMAL LOW (ref >60)
GFR calc non Af Amer: 38 mL/min — ABNORMAL LOW (ref >60)
Glucose, Bld: 93 mg/dL (ref 70–99)
Potassium: 3.6 mmol/L (ref 3.5–5.1)
Sodium: 141 mmol/L (ref 135–145)

## 2019-03-30 LAB — I-STAT BETA HCG BLOOD, ED (MC, WL, AP ONLY): I-stat hCG, quantitative: <5 m[IU]/mL (ref <5)

## 2019-03-30 MED ORDER — SODIUM CHLORIDE 0.9% FLUSH: 3.0000 mL | Freq: Once | INTRAVENOUS | Status: DC

## 2019-03-30 NOTE — ED Notes (Signed)
Per RN Edison Nasuti pt to be discharged and she does not need the troponin

## 2019-03-30 NOTE — ED Notes (Signed)
Pt was discharged from the ED. Pt read and understood discharge paperwork. Pt had vital signs completed. Pt conscious, breathing, and A&Ox4. No distress noted. Pt speaking in complete sentences. Pt brought out of the ED via wheelchair. E-signature not available. 

## 2019-03-30 NOTE — ED Triage Notes (Signed)
The pt was brought in by patr from [facility]  The pt reports that she has sob chills and muscle spasms  For a few days  She was just at [facility] 3-4  And was  Maybe at [facility] earlier tonight

## 2019-03-30 NOTE — ED Notes (Signed)
Dr Dayna Barker in room with pt will return

## 2019-03-31 ENCOUNTER — Other Ambulatory Visit: Payer: Self-pay | Admitting: Emergency Medicine

## 2019-03-31 ENCOUNTER — Ambulatory Visit: Payer: Medicare HMO | Admitting: Emergency Medicine

## 2019-03-31 ENCOUNTER — Other Ambulatory Visit: Payer: Self-pay

## 2019-03-31 ENCOUNTER — Telehealth: Payer: Self-pay | Admitting: Emergency Medicine

## 2019-03-31 DIAGNOSIS — J42 Unspecified chronic bronchitis: Secondary | ICD-10-CM

## 2019-03-31 MED ORDER — ALBUTEROL SULFATE HFA 108 (90 BASE) MCG/ACT IN AERS: 2.0000 | INHALATION_SPRAY | Freq: Four times a day (QID) | RESPIRATORY_TRACT | 0 refills | Status: DC | PRN

## 2019-03-31 MED ORDER — TRELEGY ELLIPTA 100-62.5-25 MCG/INH IN AEPB: 1.0000 | INHALATION_SPRAY | Freq: Every day | RESPIRATORY_TRACT | 0 refills | Status: DC
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
## 2019-03-31 NOTE — Telephone Encounter (Signed)
error 

## 2019-04-01 ENCOUNTER — Ambulatory Visit (INDEPENDENT_AMBULATORY_CARE_PROVIDER_SITE_OTHER): Payer: Medicare HMO | Admitting: Psychiatry

## 2019-04-01 DIAGNOSIS — F4310 Post-traumatic stress disorder, unspecified: Secondary | ICD-10-CM | POA: Diagnosis not present

## 2019-04-01 DIAGNOSIS — F5081 Binge eating disorder: Secondary | ICD-10-CM | POA: Diagnosis not present

## 2019-04-01 DIAGNOSIS — F250 Schizoaffective disorder, bipolar type: Secondary | ICD-10-CM | POA: Diagnosis not present

## 2019-04-01 MED ORDER — PRAZOSIN HCL 5 MG PO CAPS: 5.0000 mg | ORAL_CAPSULE | Freq: Two times a day (BID) | ORAL | 2 refills | Status: DC

## 2019-04-01 MED ORDER — ASENAPINE MALEATE 10 MG SL SUBL: 10.0000 mg | SUBLINGUAL_TABLET | Freq: Three times a day (TID) | SUBLINGUAL | 2 refills | Status: DC
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
## 2019-04-03 ENCOUNTER — Other Ambulatory Visit (HOSPITAL_COMMUNITY): Payer: Self-pay | Admitting: *Deleted

## 2019-04-03 MED ORDER — TRAZODONE HCL 100 MG PO TABS: 100.0000 mg | ORAL_TABLET | Freq: Every day | ORAL | 0 refills | Status: DC

## 2019-04-03 MED ORDER — ESCITALOPRAM OXALATE 20 MG PO TABS: 40.0000 mg | ORAL_TABLET | Freq: Every day | ORAL | 0 refills | Status: DC

## 2019-04-05 ENCOUNTER — Other Ambulatory Visit (HOSPITAL_COMMUNITY): Payer: Self-pay | Admitting: Psychiatry

## 2019-04-05 DIAGNOSIS — I5032 Chronic diastolic (congestive) heart failure: Secondary | ICD-10-CM

## 2019-04-09 ENCOUNTER — Other Ambulatory Visit (HOSPITAL_COMMUNITY): Payer: Self-pay | Admitting: Psychiatry

## 2019-04-09 MED ORDER — LISDEXAMFETAMINE DIMESYLATE 70 MG PO CAPS: 70.0000 mg | ORAL_CAPSULE | Freq: Every day | ORAL | 0 refills | Status: DC

## 2019-04-15 ENCOUNTER — Telehealth: Payer: Self-pay | Admitting: Acute Care

## 2019-04-16 ENCOUNTER — Other Ambulatory Visit (HOSPITAL_COMMUNITY): Payer: Self-pay | Admitting: Psychiatry

## 2019-04-16 ENCOUNTER — Telehealth (HOSPITAL_COMMUNITY): Payer: Self-pay

## 2019-04-16 ENCOUNTER — Ambulatory Visit: Payer: Medicare HMO | Admitting: Acute Care

## 2019-04-16 MED ORDER — LISDEXAMFETAMINE DIMESYLATE 70 MG PO CAPS: 70.0000 mg | ORAL_CAPSULE | Freq: Every day | ORAL | 0 refills | Status: DC

## 2019-04-16 NOTE — Telephone Encounter (Signed)
Done

## 2019-04-16 NOTE — Telephone Encounter (Signed)
[place] 70MG  CAPSULE CASE ID# [id number] EFFECTIVE 04/16/2019 TO 01/16/2020

## 2019-04-16 NOTE — Telephone Encounter (Signed)
I spoke with Tiffany [name] on [place] regarding patient's Vyvanse 70mg . Medication is out of stock at their pharmacy. Please resend to [facility] on [address]. In [place]. Thank you.

## 2019-04-16 NOTE — Telephone Encounter (Signed)
Error

## 2019-04-17 ENCOUNTER — Ambulatory Visit
Admission: EM | Admit: 2019-04-17 | Discharge: 2019-04-17 | Disposition: A | Discharge status: Home / Self Care | Payer: Medicare HMO | Attending: Emergency Medicine | Admitting: Emergency Medicine

## 2019-04-17 DIAGNOSIS — Z760 Encounter for issue of repeat prescription: Secondary | ICD-10-CM | POA: Diagnosis not present

## 2019-04-17 DIAGNOSIS — I10 Essential (primary) hypertension: Secondary | ICD-10-CM

## 2019-04-17 DIAGNOSIS — I5032 Chronic diastolic (congestive) heart failure: Secondary | ICD-10-CM

## 2019-04-17 DIAGNOSIS — M1990 Unspecified osteoarthritis, unspecified site: Secondary | ICD-10-CM

## 2019-04-17 DIAGNOSIS — M109 Gout, unspecified: Secondary | ICD-10-CM | POA: Diagnosis not present

## 2019-04-17 DIAGNOSIS — E785 Hyperlipidemia, unspecified: Secondary | ICD-10-CM

## 2019-04-17 DIAGNOSIS — N3281 Overactive bladder: Secondary | ICD-10-CM

## 2019-04-17 MED ORDER — FEBUXOSTAT 40 MG PO TABS: 40.0000 mg | ORAL_TABLET | Freq: Every day | ORAL | 0 refills | Status: DC

## 2019-04-17 MED ORDER — DICLOFENAC SODIUM 1 % EX GEL: 2.0000 g | Freq: Four times a day (QID) | CUTANEOUS | 0 refills | Status: DC

## 2019-04-17 MED ORDER — COLCHICINE 0.6 MG PO TABS: ORAL_TABLET | ORAL | 0 refills | Status: DC

## 2019-04-17 MED ORDER — AMLODIPINE BESYLATE 5 MG PO TABS: ORAL_TABLET | ORAL | 0 refills | Status: DC

## 2019-04-17 MED ORDER — DICLOFENAC SODIUM 50 MG PO TBEC: 50.0000 mg | DELAYED_RELEASE_TABLET | Freq: Every day | ORAL | 0 refills | Status: DC

## 2019-04-17 NOTE — ED Provider Notes (Signed)
[facility]    CSN: [id number] Arrival date & time: 04/17/19  1142      History   Chief Complaint Chief Complaint  Patient presents with  . Gout    HPI Tiffany [name] is a 59 y.o. female with extensive medical history as outlined below including hypertension, gout and arthritis presenting for medication refill.  Feels her arthritis and gout have both flared "all over "for the last 3 weeks.  Denying fever, chills, malaise, decreased appetite, cough or difficulty breathing, chest pain.  Patient has been out of her colchicine, fubuxostat, diclofenac for over a month.  States she has been tolerating these well and verbalized understanding of acute regimen for gout flares.  States she is looking to transfer care for PCP.   Past Medical History:  Diagnosis Date  . Agitation 11/22/2017  . AKI (acute kidney injury) ([place]) 11/21/2017  . Anemia 04/24/2011  . Anoxic brain injury ([place]) 09/08/2016   C. Arrest due to respiratory failure and COPD exacerbation  . Anxiety   . Arthritis    "all over" (04/10/2016)  . Asthma 10/18/2010  . Binge eating disorder   . Cardiac arrest ([place]) 09/08/2016   PEA  . Carotid artery stenosis    1-39% bilateral by dopplers 11/2016  . Chronic bronchitis ([place])   . Chronic diastolic (congestive) heart failure ([place])   . Chronic kidney disease    "I see a kidney dr." (04/10/2016)  . Chronic pain syndrome 06/18/2012  . Chronic post-traumatic stress disorder (PTSD) 05/27/2018  . Chronic respiratory failure with hypoxia and hypercapnia (HCC) 06/22/2015   TRILOGY Vent >AVAPA-ES., Vt target 200-400, Max P 30 , PS max 20 , PS min 6-10 , E Max 6, E Min 4, Rate Auto AVAPS Rate 2 (titrate for pt comfort) , bleed O2 at 5l/m continuous flow .   [name] CKD (chronic kidney disease) stage 3, GFR 30-59 ml/min 12/15/2016  . Closed displaced fracture of fifth metacarpal bone 03/21/2018  . Cocaine use disorder, severe, in sustained remission ([place]) 12/17/2015  . Complication of  anesthesia    decreased bp, decreased heart rate  . Constipation   . COPD (chronic obstructive pulmonary disease) ([employer]) 07/08/2014  . Depression   . Diabetic neuropathy ([place]) 04/24/2011  . Difficulty with speech 01/24/2018  . Disorder of nervous system   . Drug abuse ([place]) 11/21/2017  . Dyslipidemia 04/24/2011  . Dyspnea   . Elevated troponin 04/28/2012  . Emphysema   . Encephalopathy 11/21/2017  . Essential hypertension 03/22/2016  . Fall   . Fibula fracture 07/10/2016  . Frequent falls 10/11/2017  . GERD (gastroesophageal reflux disease)   . Gout 04/11/2017  . Heart attack ([place]) 1980s  . History of blood transfusion 1994   "couldn't stop bleeding from my period"  . History of drug abuse in remission ([place]) 11/28/2015   Quit in 2017  . Hyperlipidemia LDL goal <70   . Hypertension   . Incontinence   . Manic depression ([place])   . Morbid obesity ([place]) 10/18/2010  . Obstructive sleep apnea 10/18/2010  . On home oxygen therapy    "6L; 24/7" (04/10/2016)  . OSA on CPAP    "wear mask sometimes" (04/10/2016)  . Paranoid ([place])    "sometimes; I'm on RX for it" (04/10/2016)  . Pneumonia    "I've had it several times; haven't had it since 06/2015" (04/10/2016)  . Prolonged Q-T interval on ECG   . QT prolongation   . Rectal bleeding 12/31/2015  .  Right carotid bruit 11/09/2016  . Right foot pain 11/15/2016  . Scabies 06/01/2015  . Schizoaffective disorder, bipolar type ([place]) 04/05/2018  . Seasonal allergies   . Seborrheic keratoses 12/31/2013  . Seizure ([place]) 01/04/2013  . Seizures ([place])    "don't know what kind; last one was ~ 1 yr ago" (04/10/2016)  . Sinus trouble   . Skin irritation 04/07/2012  . SOB (shortness of breath) 04/10/2016  . Stroke [facility]) 1980s   denies residual on 04/10/2016  . Syncope 01/06/2017  . Thrush 09/19/2013  . Tobacco use disorder 07/22/2014  . Type 2 diabetes mellitus ([place]) 10/18/2010  . Type II diabetes mellitus ([place])   . Upper abdominal pain   . Vaginal discharge 01/03/2018   . Wheezing 04/12/2016  . Wound, breast 01/03/2018    Patient Active Problem List   Diagnosis Date Noted  . Family discord 02/04/2019  . Aggressive behavior   . PTSD (post-traumatic stress disorder) 05/27/2018  . Schizoaffective disorder, bipolar type ([place]) 04/05/2018  . Closed displaced fracture of fifth metacarpal bone 03/21/2018  . Difficulty with speech 01/24/2018  . Vaginal discharge 01/03/2018  . AKI (acute kidney injury) ([place]) 11/21/2017  . Encephalopathy 11/21/2017  . Drug abuse ([place]) 11/21/2017  . Frequent falls 10/11/2017  . Binge eating disorder   . Dependence on continuous supplemental oxygen 05/14/2017  . Gout 04/11/2017  . Palliative care encounter 03/12/2017  . CKD (chronic kidney disease) stage 3, GFR 30-59 ml/min 12/15/2016  . Carotid artery stenosis   . Osteoarthritis 10/26/2016  . Anoxic brain injury ([place]) 09/08/2016  . Overactive bladder 06/07/2016  . QT prolongation   . OSA and COPD overlap syndrome ([place])   . Arthritis   . Essential hypertension 03/22/2016  . Cocaine use disorder, severe, in sustained remission ([place]) 12/17/2015  . History of drug abuse in remission ([place]) 11/28/2015  . Chronic diastolic congestive heart failure ([place])   . Chronic respiratory failure with hypoxia and hypercapnia ([place]) 06/22/2015  . Tobacco use disorder 07/22/2014  . COPD (chronic obstructive pulmonary disease) ([place]) 07/08/2014  . Seizure ([place]) 01/04/2013  . Chronic pain syndrome 06/18/2012  . Dyslipidemia 04/24/2011  . Anemia 04/24/2011  . Diabetic neuropathy ([place]) 04/24/2011  . Asthma 10/18/2010  . Morbid obesity ([place]) 10/18/2010  . Type 2 diabetes mellitus ([place]) 10/18/2010    Past Surgical History:  Procedure Laterality Date  . CESAREAN SECTION  1997  . HERNIA REPAIR    . IR CHOLANGIOGRAM EXISTING TUBE  07/20/2016  . IR PERC CHOLECYSTOSTOMY  05/10/2016  . IR RADIOLOGIST EVAL & MGMT  06/08/2016  . IR RADIOLOGIST EVAL & MGMT  06/29/2016  . IR SINUS/FIST TUBE CHK-NON  GI  07/12/2016  . RIGHT/LEFT HEART CATH AND CORONARY ANGIOGRAPHY N/A 06/19/2017   Procedure: RIGHT/LEFT HEART CATH AND CORONARY ANGIOGRAPHY;  Surgeon: Jolaine Artist, MD;  Location: [facility];  Service: Cardiovascular;  Laterality: N/A;  . TIBIA IM NAIL INSERTION Right 07/12/2016   Procedure: INTRAMEDULLARY (IM) NAIL RIGHT TIBIA;  Surgeon: Leandrew Koyanagi, MD;  Location: [facility];  Service: Orthopedics;  Laterality: Right;  . UMBILICAL HERNIA REPAIR  ~ 1963   "that's why I don't have a belly button"  . VAGINAL HYSTERECTOMY      OB History    Gravida  4   Para  4   Term  3   Preterm  1   AB  0   Living  3     SAB  0   TAB  0   Ectopic  0   Multiple  0   Live Births  3            Home Medications    Prior to Admission medications   Medication Sig Start Date End Date Taking? Authorizing Provider  albuterol (VENTOLIN HFA) 108 (90 Base) MCG/ACT inhaler Inhale 2 puffs into the lungs every 6 (six) hours as needed for wheezing or shortness of breath. 03/31/19   Collene Gobble, MD  amLODipine (NORVASC) 5 MG tablet TAKE 1 TABLET (5 MG TOTAL) BY MOUTH EVERY MORNING 04/17/19   Hall-Potvin, [place], PA-C  Asenapine Maleate 10 MG SUBL Place 1 tablet (10 mg total) under the tongue 3 (three) times daily. 04/01/19 06/30/19  Pucilowski, Marchia Bond, MD  aspirin (GOODSENSE ASPIRIN LOW DOSE) 81 MG EC tablet TAKE 1 TABLET (81 MG TOTAL) BY MOUTH DAILY (MORNING). Patient taking differently: Take 81 mg by mouth daily.  11/12/17   Zenia Resides, MD  atorvastatin (LIPITOR) 80 MG tablet Take 1 tablet (80 mg total) by mouth daily. 12/10/18   Maximiano Coss, NP  budesonide-formoterol [facility]) 80-4.5 MCG/ACT inhaler Inhale 2 puffs into the lungs 2 (two) times daily. 11/15/18 12/15/18  Maximiano Coss, NP  colchicine (COLCRYS) 0.6 MG tablet TAKE 1 TABLET (0.6 MG TOTAL) BY MOUTH 2 (TWO) TIMES DAILY. 04/17/19   Hall-Potvin, [place], PA-C  diazepam (VALIUM) 10 MG tablet Take 1 tablet (10 mg  total) by mouth 3 (three) times daily. 03/26/19 06/24/19  Pucilowski, Marchia Bond, MD  diclofenac (VOLTAREN) 50 MG EC tablet Take 1 tablet (50 mg total) by mouth daily. 04/17/19   Hall-Potvin, [place], PA-C  diclofenac Sodium (VOLTAREN) 1 % GEL Apply 2 g topically 4 (four) times daily. 04/17/19   Hall-Potvin, [place], PA-C  docusate sodium (COLACE) 100 MG capsule Take 1 capsule (100 mg total) by mouth daily as needed for mild constipation or moderate constipation. 12/10/18   Maximiano Coss, NP  doxazosin (CARDURA) 2 MG tablet Take 1 tablet (2 mg total) by mouth daily. 11/15/18 12/15/18  Maximiano Coss, NP  escitalopram (LEXAPRO) 20 MG tablet Take 2 tablets (40 mg total) by mouth daily. 04/03/19 07/02/19  Pucilowski, Marchia Bond, MD  febuxostat (ULORIC) 40 MG tablet Take 1 tablet (40 mg total) by mouth daily. 04/17/19   Hall-Potvin, [place], PA-C  ferrous sulfate 325 (65 FE) MG tablet Take 1 tablet (325 mg total) by mouth daily with breakfast. 12/04/18   Maximiano Coss, NP  flunisolide (NASALIDE) 25 MCG/ACT (0.025%) SOLN Place 2 sprays into the nose daily. 06/26/17   McDiarmid, Blane Ohara, MD  Fluticasone-Umeclidin-Vilant (TRELEGY ELLIPTA) 100-62.5-25 MCG/INH AEPB Inhale 1 puff into the lungs daily. 03/31/19   Collene Gobble, MD  gabapentin (NEURONTIN) 600 MG tablet Take 1 tablet (600 mg total) by mouth 2 (two) times daily. 11/15/18   Maximiano Coss, NP  lidocaine (LIDODERM) 5 % Place 1 patch onto the skin every 12 (twelve) hours.  01/15/19   [provider]  lisdexamfetamine (VYVANSE) 70 MG capsule Take 1 capsule (70 mg total) by mouth daily. 04/16/19 05/16/19  Pucilowski, Marchia Bond, MD  metolazone (ZAROXOLYN) 2.5 MG tablet Take 1 tablet (2.5 mg total) by mouth daily. Patient taking differently: Take 2.5 mg by mouth 3 (three) times a week.  04/22/18   Robyn Haber, MD  Multiple Vitamin (MULTIVITAMIN WITH MINERALS) TABS tablet Take 1 tablet by mouth daily.    [provider]  omeprazole  (PRILOSEC) 40 MG capsule Take 1 capsule (40 mg total)  by mouth 2 (two) times daily. 12/04/18   Maximiano Coss, NP  oxybutynin (DITROPAN-XL) 10 MG 24 hr tablet Take 1-2 tablets (10-20 mg total) by mouth at bedtime. Patient taking differently: Take 10 mg by mouth 2 (two) times daily.  12/10/18   Maximiano Coss, NP  polyethylene glycol (MIRALAX / GLYCOLAX) 17 g packet Take 17 g by mouth daily as needed for mild constipation or moderate constipation. 12/10/18   Maximiano Coss, NP  potassium chloride (K-DUR) 10 MEQ tablet Take 10 mEq by mouth 2 (two) times daily.    [provider]  prazosin (MINIPRESS) 5 MG capsule Take 1 capsule (5 mg total) by mouth 2 (two) times daily. 04/01/19 06/30/19  Pucilowski, Marchia Bond, MD  torsemide (DEMADEX) 20 MG tablet Take 4 tablets (80 mg total) by mouth 2 (two) times daily. 11/15/18   Maximiano Coss, NP  traZODone (DESYREL) 100 MG tablet Take 1 tablet (100 mg total) by mouth at bedtime. 04/03/19 07/02/19  Pucilowski, Marchia Bond, MD  traZODone (DESYREL) 50 MG tablet Take 1 tablet (50 mg total) by mouth daily. 03/26/19 06/24/19  Pucilowski, Marchia Bond, MD    Family History Family History  Problem Relation Age of Onset  . Cancer Father        prostate  . Cancer Mother        lung  . Depression Mother   . Depression Sister   . Anxiety disorder Sister   . Schizophrenia Sister   . Bipolar disorder Sister   . Depression Sister   . Depression Brother   . Heart failure Other        cousin    Social History Social History   Tobacco Use  . Smoking status: Former Smoker    Packs/day: 1.50    Years: 38.00    Pack years: 57.00    Types: Cigarettes    Start date: 03/13/1977    Quit date: 04/10/2016    Years since quitting: 3.0  . Smokeless tobacco: Never Used  Substance Use Topics  . Alcohol use: No    Alcohol/week: 0.0 standard drinks  . Drug use: No    Types: Cocaine    Comment: 04/10/2016 "last used cocaine back in November 2017"     Allergies    Hydrocodone, Hydrocodone-acetaminophen, Hydroxyzine, Latuda [lurasidone hcl], Lurasidone, Magnesium-containing compounds, Prednisone, Tramadol, Codeine, Other, Sulfa antibiotics, and Tape   Review of Systems As per HPI   Physical Exam Triage Vital Signs ED Triage Vitals  Enc Vitals Group     BP      Pulse      Resp      Temp      Temp src      SpO2      Weight      Height      Head Circumference      Peak Flow      Pain Score      Pain Loc      Pain Edu?      Excl. in [place]?    No data found.  Updated Vital Signs BP (!) 150/96 (BP Location: Left Arm)   Pulse 71   Temp 97.9 F (36.6 C) (Oral)   Resp 16   SpO2 98%   Visual Acuity Right Eye Distance:   Left Eye Distance:   Bilateral Distance:    Right Eye Near:   Left Eye Near:    Bilateral Near:     Physical Exam Constitutional:  General: [name] not in acute distress.    Appearance: [name] obese.  HENT:     Head: Normocephalic and atraumatic.  Eyes:     General: No scleral icterus.    Pupils: Pupils are equal, round, and reactive to light.  Cardiovascular:     Rate and Rhythm: Normal rate.  Pulmonary:     Effort: Pulmonary effort is normal.  Musculoskeletal:        General: Tenderness present. No swelling or deformity. Normal range of motion.     Comments: Patient reporting diffuse tenderness over all bony prominences of upper and lower extremities.  No appreciable abnormality such as effusions, bruising, bony deformities.  Skin:    Capillary Refill: Capillary refill takes less than 2 seconds.     Coloration: Skin is not jaundiced or pale.     Findings: No bruising or erythema.  Neurological:     Mental Status: [name] alert and oriented to person, place, and time.      UC Treatments / Results  Labs (all labs ordered are listed, but only abnormal results are displayed) Labs Reviewed - No data to display  EKG   Radiology No results found.  Procedures Procedures (including critical care  time)  Medications Ordered in UC Medications - No data to display  Initial Impression / Assessment and Plan / UC Course  I have reviewed the triage vital signs and the nursing notes.  Pertinent labs & imaging results that were available during my care of the patient were reviewed by me and considered in my medical decision making (see chart for details).     Afebrile, nontoxic in office today.  Discussed that I am willing to refill medications for 1 month, though patient needs to be established with new PCP for further refills.  Reviewed short course of colchicine for gout, appropriate regimen of diclofenac p.o. versus gel.  We will also refill unloaded pain patient states she is about to run out, and I anticipate it will be 1 month before she seen by a new PCP.  Provided contact information for office [place]door.  Return precautions discussed, patient verbalized understanding and is agreeable to plan. Final Clinical Impressions(s) / UC Diagnoses   Final diagnoses:  Acute gout of multiple sites, unspecified cause  Arthritis  Encounter for medication refill  Essential hypertension     Discharge Instructions     Important to schedule appointment with PCP for further refills    ED Prescriptions    Medication Sig Dispense Auth. Provider   febuxostat (ULORIC) 40 MG tablet Take 1 tablet (40 mg total) by mouth daily. 30 tablet Hall-Potvin, [place], PA-C   diclofenac Sodium (VOLTAREN) 1 % GEL Apply 2 g topically 4 (four) times daily. 100 g Hall-Potvin, [place], PA-C   colchicine (COLCRYS) 0.6 MG tablet TAKE 1 TABLET (0.6 MG TOTAL) BY MOUTH 2 (TWO) TIMES DAILY. 30 tablet Hall-Potvin, [place], PA-C   amLODipine (NORVASC) 5 MG tablet TAKE 1 TABLET (5 MG TOTAL) BY MOUTH EVERY MORNING 30 tablet Hall-Potvin, [place], PA-C   diclofenac (VOLTAREN) 50 MG EC tablet Take 1 tablet (50 mg total) by mouth daily. 15 tablet Hall-Potvin, [place], PA-C     PDMP not reviewed this encounter.    Hall-Potvin, [place], [place] 04/17/19 1238

## 2019-04-17 NOTE — Discharge Instructions (Addendum)
Important to schedule appointment with PCP for further refills

## 2019-04-17 NOTE — ED Triage Notes (Signed)
Pt c/o gout and arthritis all over x3 wks. States out of her colcrys 0.6mg , febuxostat 40mg  and Voltaren 75mg .

## 2019-04-21 ENCOUNTER — Ambulatory Visit: Payer: Self-pay

## 2019-04-22 IMAGING — CR DG CHEST 2V
2 series · 2 of 2 positions shown · non-contrast
Comparison: Chest radiograph dated 04/10/2016

CLINICAL DATA: 55-year-old female with shortness of breath and
right-sided chest pain.

EXAM:
CHEST  2 VIEW

[chest pa]
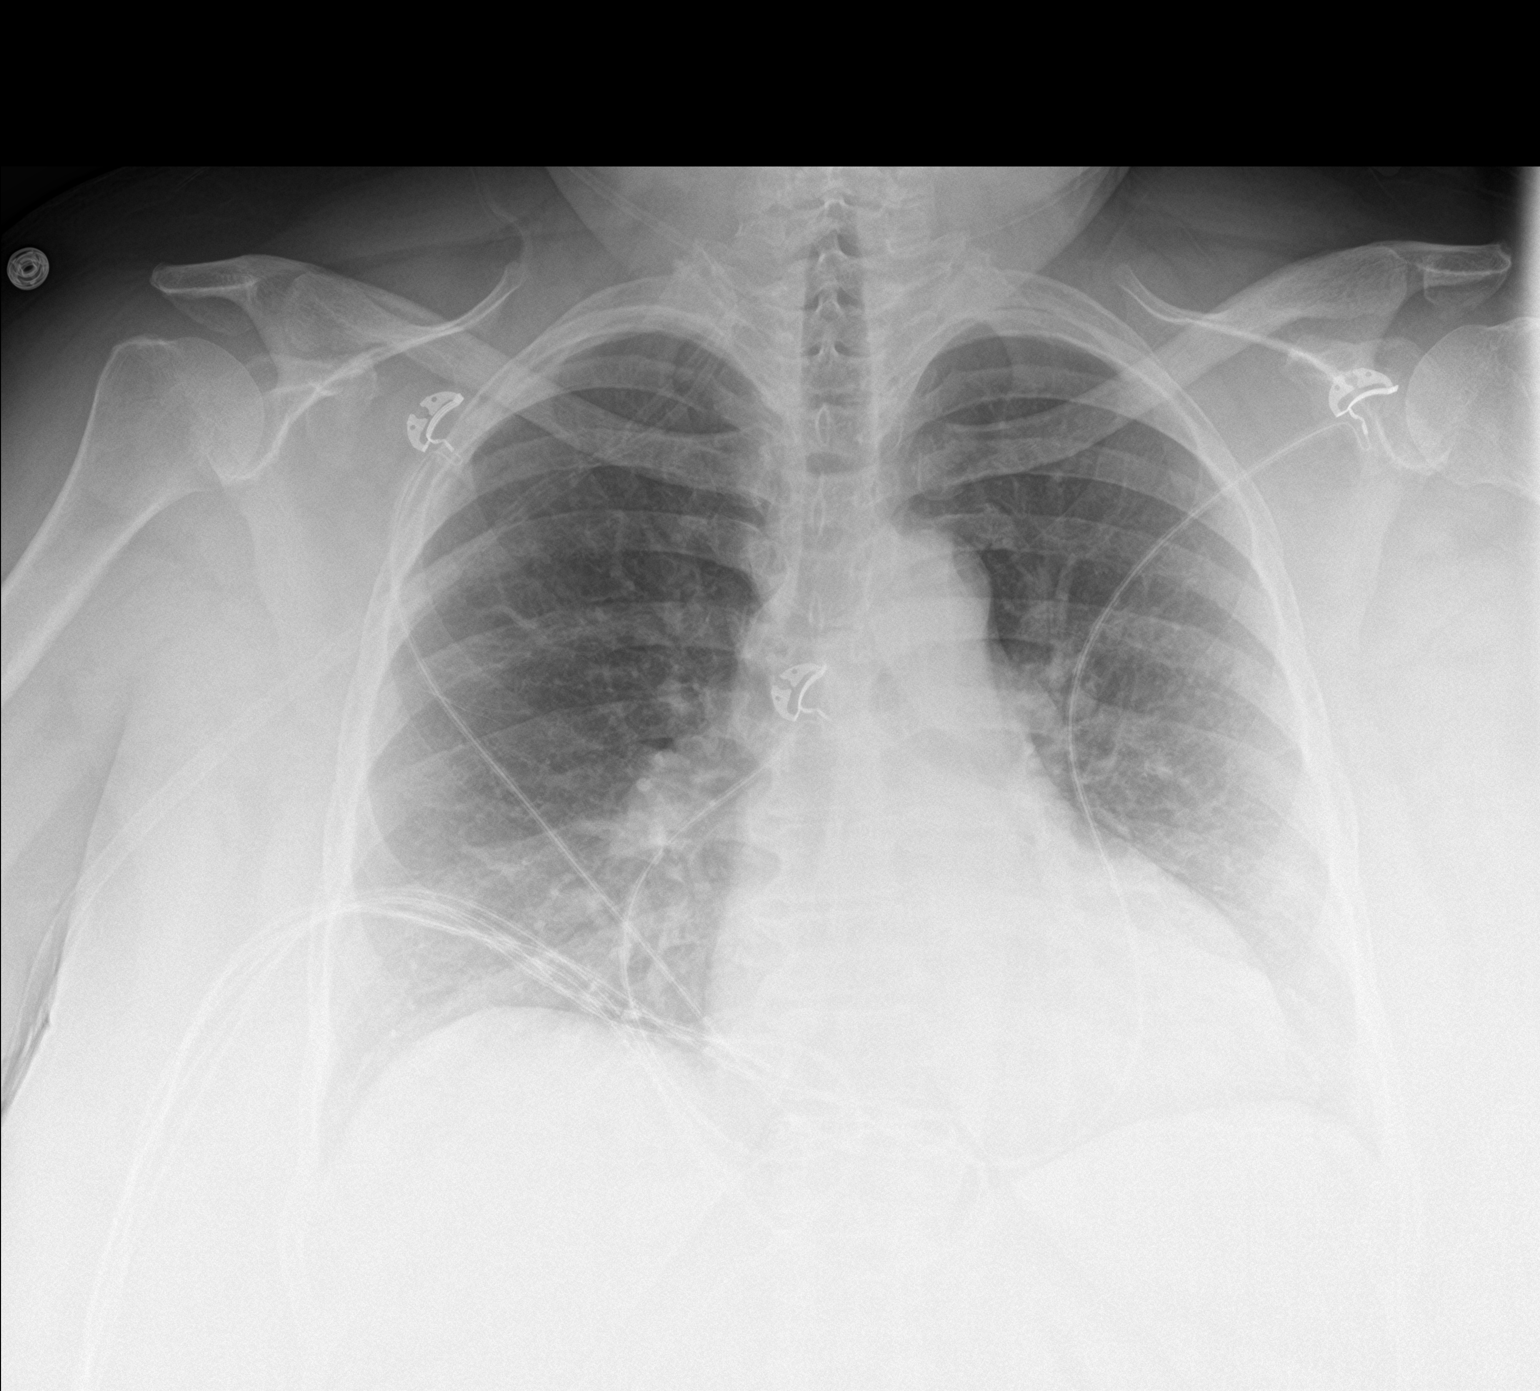

[chest lat]
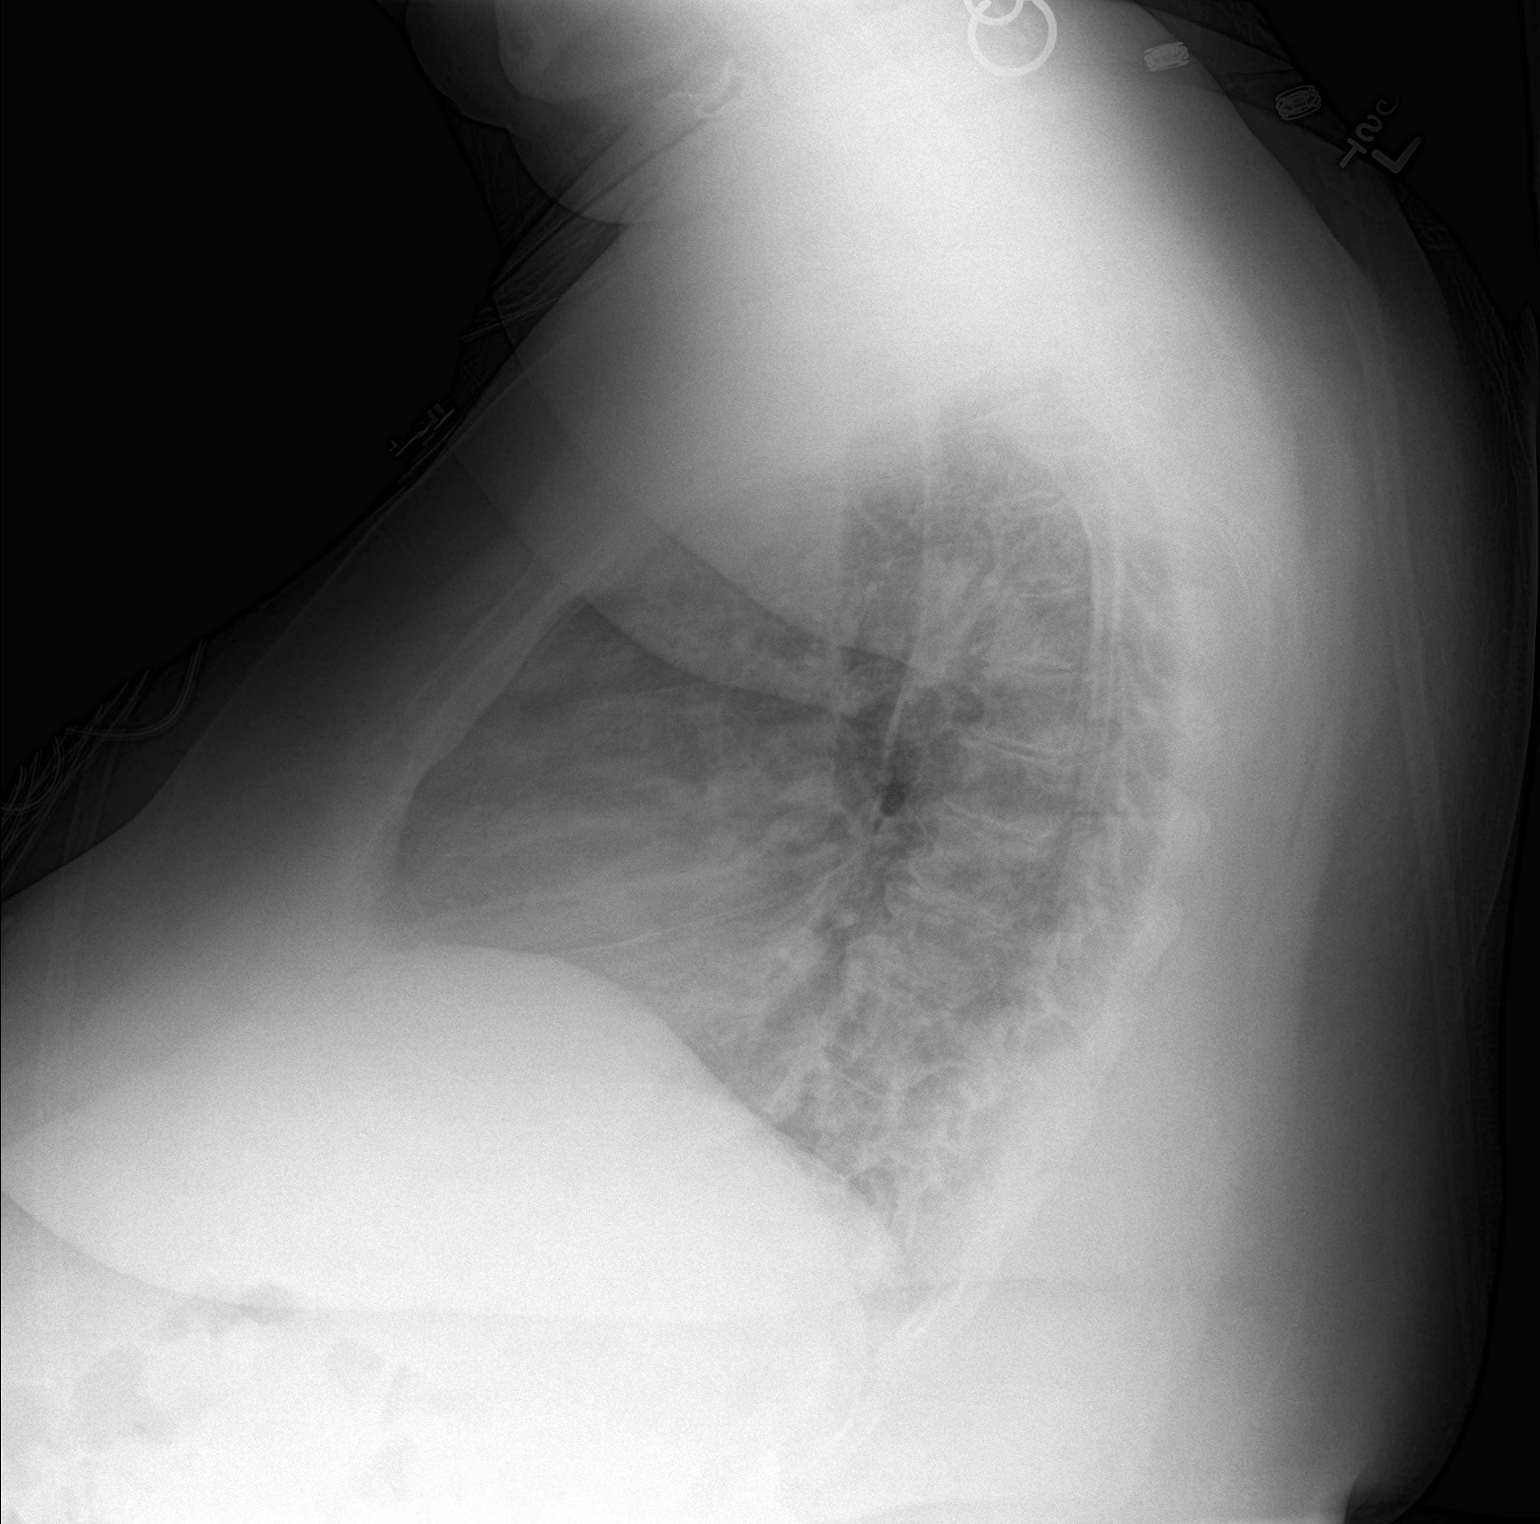

[2 of 2 positions shown; findings below may reference images not displayed]

FINDINGS: Top-normal cardiac size. The lungs are clear. There is no pleural
effusion or pneumothorax. No acute osseous pathology.
IMPRESSION: 1. No acute cardiopulmonary process.
2. Stable top-normal cardiac size.

## 2019-04-22 IMAGING — NM NM HEPATOBILIARY IMAGE, INC GB
2 series · 12 of 12 positions shown · non-contrast
Comparison: Ultrasound 05/09/2016.

CLINICAL DATA: Right upper quadrant abdominal pain for 1 day.
Leukocytosis. Sonographic findings suspicious for acute
cholecystitis.

EXAM:
NUCLEAR MEDICINE HEPATOBILIARY IMAGING
TECHNIQUE: Sequential images of the abdomen were obtained [DATE] minutes
following intravenous administration of radiopharmaceutical.
RADIOPHARMACEUTICALS:  5.1 mCi 8c-FFm  Choletec IV

[he hepatobiliary · 3.43mm/px · 6 of 60 frames shown (1 of 2)]
[frame 6/60]
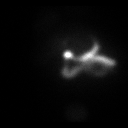
[frame 16/60]
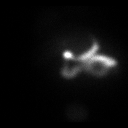
[frame 26/60]
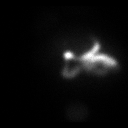
[frame 36/60]
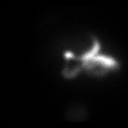
[frame 46/60]
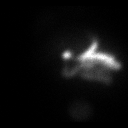
[frame 56/60]
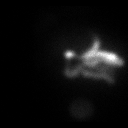

[he hepatobiliary · 3.43mm/px · 6 of 60 frames shown (2 of 2)]
[frame 6/60]
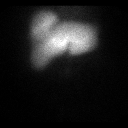
[frame 16/60]
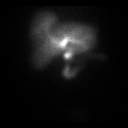
[frame 26/60]
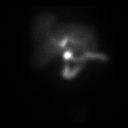
[frame 36/60]
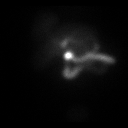
[frame 46/60]
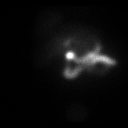
[frame 56/60]
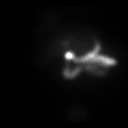

[12 of 12 positions shown; findings below may reference images not displayed]

FINDINGS: Initial images demonstrate homogeneous hepatic activity and
spontaneous filling of the biliary system. There is drainage into
duodenum with some reflux into the stomach. No opacification of the
gallbladder is identified. Patient refused morphine sulfate
administration (typically given to promote contraction of the
sphincter of Oddi and increased specificity of non filling of the
gallbladder).
IMPRESSION: No opacification of the cystic duct or gallbladder, supporting the
presence of acute cholecystitis. Specificity decreased by lack of
post morphine imaging.

## 2019-04-24 ENCOUNTER — Encounter: Payer: Self-pay | Admitting: Orthopaedic Surgery

## 2019-04-24 ENCOUNTER — Ambulatory Visit (INDEPENDENT_AMBULATORY_CARE_PROVIDER_SITE_OTHER): Payer: Medicare Other

## 2019-04-24 ENCOUNTER — Ambulatory Visit (INDEPENDENT_AMBULATORY_CARE_PROVIDER_SITE_OTHER): Payer: Medicare Other | Admitting: Orthopaedic Surgery

## 2019-04-24 ENCOUNTER — Other Ambulatory Visit: Payer: Self-pay

## 2019-04-24 DIAGNOSIS — M79604 Pain in right leg: Secondary | ICD-10-CM

## 2019-04-24 DIAGNOSIS — J449 Chronic obstructive pulmonary disease, unspecified: Secondary | ICD-10-CM | POA: Diagnosis not present

## 2019-04-24 MED ORDER — MELOXICAM 7.5 MG PO TABS: 7.5000 mg | ORAL_TABLET | Freq: Every day | ORAL | 2 refills | Status: AC
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
## 2019-04-25 DIAGNOSIS — R062 Wheezing: Secondary | ICD-10-CM | POA: Diagnosis not present

## 2019-04-25 DIAGNOSIS — J45909 Unspecified asthma, uncomplicated: Secondary | ICD-10-CM | POA: Diagnosis not present

## 2019-04-25 DIAGNOSIS — G4733 Obstructive sleep apnea (adult) (pediatric): Secondary | ICD-10-CM | POA: Diagnosis not present

## 2019-04-28 ENCOUNTER — Other Ambulatory Visit (HOSPITAL_COMMUNITY): Payer: Self-pay

## 2019-04-28 ENCOUNTER — Ambulatory Visit (HOSPITAL_COMMUNITY): Payer: Medicare Other | Attending: Orthopaedic Surgery

## 2019-04-28 ENCOUNTER — Other Ambulatory Visit: Payer: Self-pay

## 2019-04-28 ENCOUNTER — Encounter: Payer: Self-pay | Admitting: Acute Care

## 2019-04-28 ENCOUNTER — Other Ambulatory Visit: Payer: Self-pay | Admitting: Acute Care

## 2019-04-28 ENCOUNTER — Ambulatory Visit (INDEPENDENT_AMBULATORY_CARE_PROVIDER_SITE_OTHER): Payer: Medicare Other | Admitting: Acute Care

## 2019-04-28 DIAGNOSIS — Z0000 Encounter for general adult medical examination without abnormal findings: Secondary | ICD-10-CM

## 2019-04-28 DIAGNOSIS — J449 Chronic obstructive pulmonary disease, unspecified: Secondary | ICD-10-CM | POA: Diagnosis not present

## 2019-04-28 DIAGNOSIS — J9611 Chronic respiratory failure with hypoxia: Secondary | ICD-10-CM | POA: Diagnosis not present

## 2019-04-28 DIAGNOSIS — G4733 Obstructive sleep apnea (adult) (pediatric): Secondary | ICD-10-CM

## 2019-04-28 DIAGNOSIS — F17200 Nicotine dependence, unspecified, uncomplicated: Secondary | ICD-10-CM | POA: Diagnosis not present

## 2019-04-28 NOTE — Progress Notes (Signed)
Virtual Visit via Telephone Note  I connected with Tiffany [name] on 04/28/19 at 11:30 AM EDT by telephone and verified that I am speaking with the correct person using two identifiers.  Location: Patient: Home Provider: Working virtually from home   I discussed the limitations, risks, security and privacy concerns of performing an evaluation and management service by telephone and the availability of in person appointments. I also discussed with the patient that there may be a patient responsible charge related to this service. The patient expressed understanding and agreed to proceed.  Synopsis: 59 year old female former smoker (quit 2018, 57-pack-year smoking history) followed in our office for COPD with asthmatic component, obstructive sleep apnea and obesity hypoventilation syndrome.  Maintained on trilogy ventilator.She wears oxygen with activity at 2 L.  Past medical history: Obesity, schizophrenia Smoking history: Former smoker.  Quit 2018.  57-pack-year smoking history Maintenance: Trelegy Ellipta Patient of Dr. Lamonte Sakai   History of Present Illness: Pt. Presents for follow up. Pt.  has been wearing her Trelegy ventilator every night She does not have any issues with her Trelegy Machine. [name] sleeping well. [name] requesting a neb machine. She states she lost her old machine in a move. She has not had a neb machine in 7 months. [name] out of the medication for the neb machine also. She states she is having to use her albuterol inhaler about 8 times a day. She states her secretions are clear to white, and that they are sticky.She denies any fever. She states she is using Mucinex to thin her secretions. She states that does not work for her. She states her new POC has been a big help in getting around with her oxygen. [name] using her oxygen every night. She has had her first Covid vaccine 04/14/2019. [name] unsure if she got the [employer] or [place].   Observations/Objective: Speech is  slurred, but she is able to maintain focus and conversation for the visit today.  04/12/2016-pulmonary function test- FVC 1.70 (63% predicted), FEV1 1.13 (53% predicted), ratio 66  10/30/2016-echocardiogram-LV ejection fraction 60 to 65%, moderate LVH   Assessment and Plan: Obstructive sleep apnea Plan: Continue trilogy ventilator at bedtime. You appear to be benefiting from the treatment  Goal is to wear for at least 6 hours each night for maximal clinical benefit. Continue to work on weight loss, as the link between excess weight  and sleep apnea is well established.   Remember to establish a good bedtime routine, and work on sleep hygiene.  Limit daytime naps , avoid stimulants such as caffeine and nicotine close to bedtime, exercise daily to promote sleep quality, avoid heavy , spicy, fried , or rich foods before bed. Ensure adequate exposure to natural light during the day,establish a relaxing bedtime routine with a pleasant sleep environment ( Bedroom between 60 and 67 degrees, turn off bright lights , TV or device screens screens , consider black out curtains or white noise machines) Do not drive if sleepy. Remember to clean machine as directed Follow up with Dr. Lamonte Sakai   In 6 months  or before as needed.    COPD (chronic obstructive pulmonary disease) (HCC) OSA overlap syndrome Over using her albuterol inhaler Plan: Continue Trelegy Ellipta inhaler as you have been doing Rinse mouth after use  We will order a neb machine for you today. We will order the albuterol neb medication for the neb machine Use this as needed for shortness of breath or wheezing up to  3 times daily. If you require your rescue medication more than 3 times daily call the office to be seen While you are using the neb machine with albuterol, do not use the albuterol inhaler.  Chronic respiratory failure with hypoxia and hypercapnia (HCC) Plan: Continue trilogy ventilator Continue oxygen therapy  2L with  activity, and 2-5 L at bedtime with your Trelegy ventilator. Saturation goals ar 88-92%  Tobacco use disorder Former smoker Stopped 2018 57-pack-year smoking history Plan: Congratulated patient on continued  Smoking cessation Need to schedule low dose CT.  Health Care Maintenance Plan Received first dose Covid Vaccine ( Unsure which one) Make sure you get the second vaccine to ensure improved effectiveness.   Follow Up Instructions: Follow up in 6 months with Dr. Lamonte Sakai Please contact office for sooner follow up if symptoms do not improve or worsen or seek emergency care   I discussed the assessment and treatment plan with the patient. The patient was provided an opportunity to ask questions and all were answered. The patient agreed with the plan and demonstrated an understanding of the instructions.   The patient was advised to call back or seek an in-person evaluation if the symptoms worsen or if the condition fails to improve as anticipated.  I provided 30 minutes of non-face-to-face time during this encounter.   Magdalen Spatz, NP 04/28/2019 12:06 PM

## 2019-04-28 NOTE — Patient Instructions (Addendum)
It was good to talk with you today For your sleep apnea our plan is: Continue trilogy ventilator at bedtime. You appear to be benefiting from the treatment  Goal is to wear for at least 6 hours each night for maximal clinical benefit. Continue to work on weight loss, as the link between excess weight  and sleep apnea is well established.   Remember to establish a good bedtime routine, and work on sleep hygiene.  Limit daytime naps , avoid stimulants such as caffeine and nicotine close to bedtime, exercise daily to promote sleep quality, avoid heavy , spicy, fried , or rich foods before bed. Ensure adequate exposure to natural light during the day,establish a relaxing bedtime routine with a pleasant sleep environment ( Bedroom between 60 and 67 degrees, turn off bright lights , TV or device screens screens , consider black out curtains or white noise machines) Do not drive if sleepy. Remember to clean machine as directed  For your COPD, our plan is Continue Trelegy Ellipta inhaler as you have been doing Rinse mouth after use  We will order a neb machine for you today. We will order the albuterol neb medication for the neb machine Use this as needed for shortness of breath or wheezing up to 3 times daily. If you require your rescue medication more than 3 times daily call the office to be seen While you are using the neb machine with albuterol, do not use the albuterol inhaler. Note your daily symptoms > remember "red flags" for COPD:  Increase in cough, increase in sputum production, increase in shortness of breath or activity intolerance. If you notice these symptoms, please call to be seen.    For your Respiratory Failure, our plan is  Continue oxygen therapy  2L with activity, and 2-5 L at bedtime with your Trelegy ventilator. Saturation goals ar 88-92%  Congratulations on remaining smoke free We need to get you scheduled for lung cancer screening  Congratulations on getting your first  Covid vaccine. Make sure you get the second vaccine to ensure improved effectiveness.    Follow up with Dr. Lamonte Sakai   In 6 months  or before as needed.  Call if you need [name] sooner  Please contact office for sooner follow up if symptoms do not improve or worsen or seek emergency care

## 2019-04-29 ENCOUNTER — Ambulatory Visit (INDEPENDENT_AMBULATORY_CARE_PROVIDER_SITE_OTHER): Payer: Medicaid Other | Admitting: Psychiatry

## 2019-04-29 ENCOUNTER — Other Ambulatory Visit: Payer: Self-pay

## 2019-04-29 DIAGNOSIS — F250 Schizoaffective disorder, bipolar type: Secondary | ICD-10-CM

## 2019-04-29 DIAGNOSIS — F4310 Post-traumatic stress disorder, unspecified: Secondary | ICD-10-CM | POA: Diagnosis not present

## 2019-04-29 DIAGNOSIS — J42 Unspecified chronic bronchitis: Secondary | ICD-10-CM

## 2019-04-29 DIAGNOSIS — F5081 Binge eating disorder: Secondary | ICD-10-CM

## 2019-04-29 MED ORDER — TRAZODONE HCL 100 MG PO TABS: 100.0000 mg | ORAL_TABLET | Freq: Every day | ORAL | 0 refills | Status: DC

## 2019-04-29 MED ORDER — ALBUTEROL SULFATE (2.5 MG/3ML) 0.083% IN NEBU: 2.5000 mg | INHALATION_SOLUTION | Freq: Four times a day (QID) | RESPIRATORY_TRACT | 5 refills | Status: DC | PRN

## 2019-04-29 MED ORDER — DIAZEPAM 10 MG PO TABS: 10.0000 mg | ORAL_TABLET | Freq: Three times a day (TID) | ORAL | 2 refills | Status: DC

## 2019-04-29 MED ORDER — TRAZODONE HCL 50 MG PO TABS: 50.0000 mg | ORAL_TABLET | Freq: Every day | ORAL | 0 refills | Status: DC

## 2019-04-29 MED ORDER — LISDEXAMFETAMINE DIMESYLATE 70 MG PO CAPS: 70.0000 mg | ORAL_CAPSULE | Freq: Every day | ORAL | 0 refills | Status: DC

## 2019-04-29 NOTE — Progress Notes (Unsigned)
amb dme  
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
## 2019-04-30 ENCOUNTER — Other Ambulatory Visit: Payer: Self-pay

## 2019-04-30 ENCOUNTER — Ambulatory Visit (HOSPITAL_COMMUNITY)
Admission: RE | Admit: 2019-04-30 | Discharge: 2019-04-30 | Disposition: A | Discharge status: Home / Self Care | Payer: Medicare Other | Source: Ambulatory Visit | Attending: Orthopaedic Surgery | Admitting: Orthopaedic Surgery

## 2019-04-30 DIAGNOSIS — M79604 Pain in right leg: Secondary | ICD-10-CM | POA: Diagnosis not present

## 2019-04-30 NOTE — Progress Notes (Signed)
Right lower extremity venous duplex has been completed. Preliminary results can be found in CV Proc through chart review.  Results were faxed to Dr. Phoebe Sharps office.  04/30/19 11:23 AM Tiffany [name] RVT

## 2019-05-07 ENCOUNTER — Encounter: Payer: Self-pay | Admitting: General Practice

## 2019-05-07 ENCOUNTER — Telehealth: Payer: Self-pay | Admitting: Primary Care

## 2019-05-07 DIAGNOSIS — J42 Unspecified chronic bronchitis: Secondary | ICD-10-CM

## 2019-05-07 IMAGING — CR DG CHEST 2V
2 series · 2 of 2 positions shown · non-contrast
Comparison: 05/09/2016

CLINICAL DATA: Shortness of breath for 3 months. Lower extremity
edema. Former smoker. History of asthma, CHF, chronic bronchitis,
heart attack, pneumonia, diabetes.

EXAM:
CHEST  2 VIEW

[chest pa]
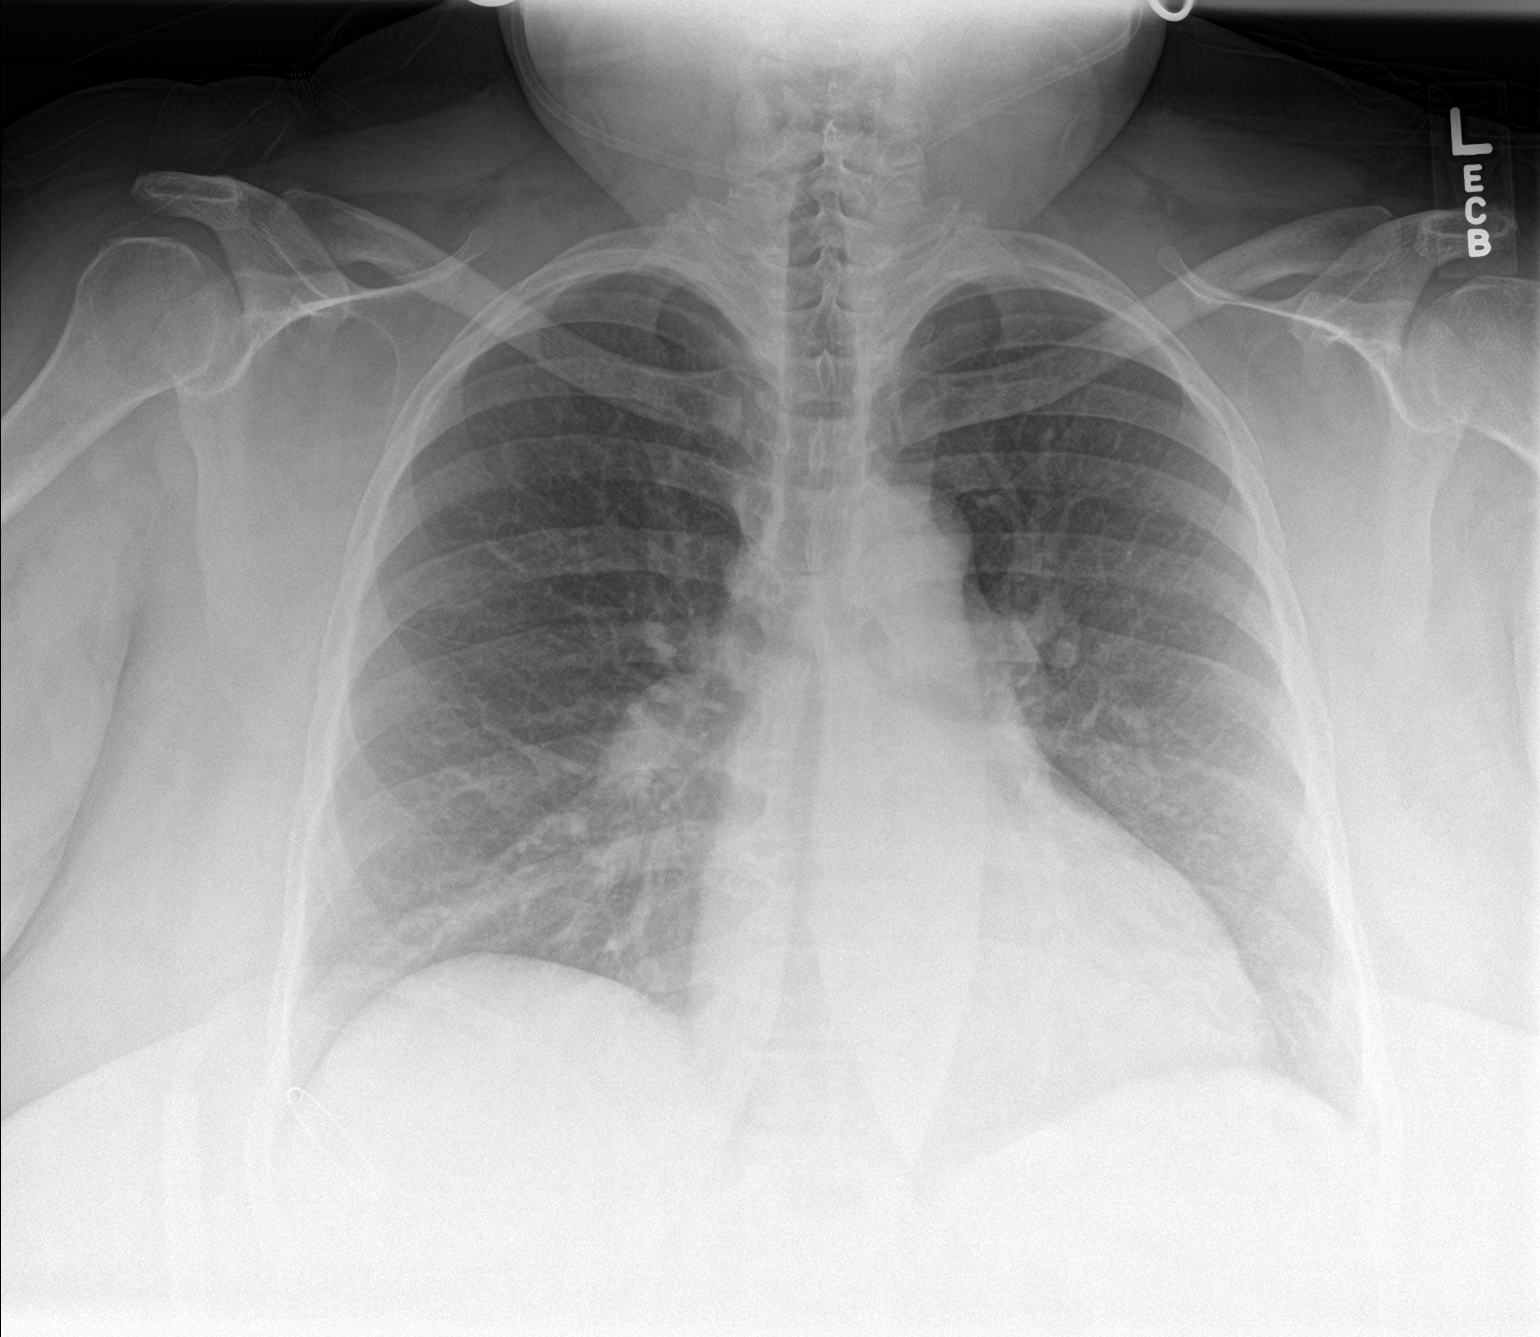

[chest lat]
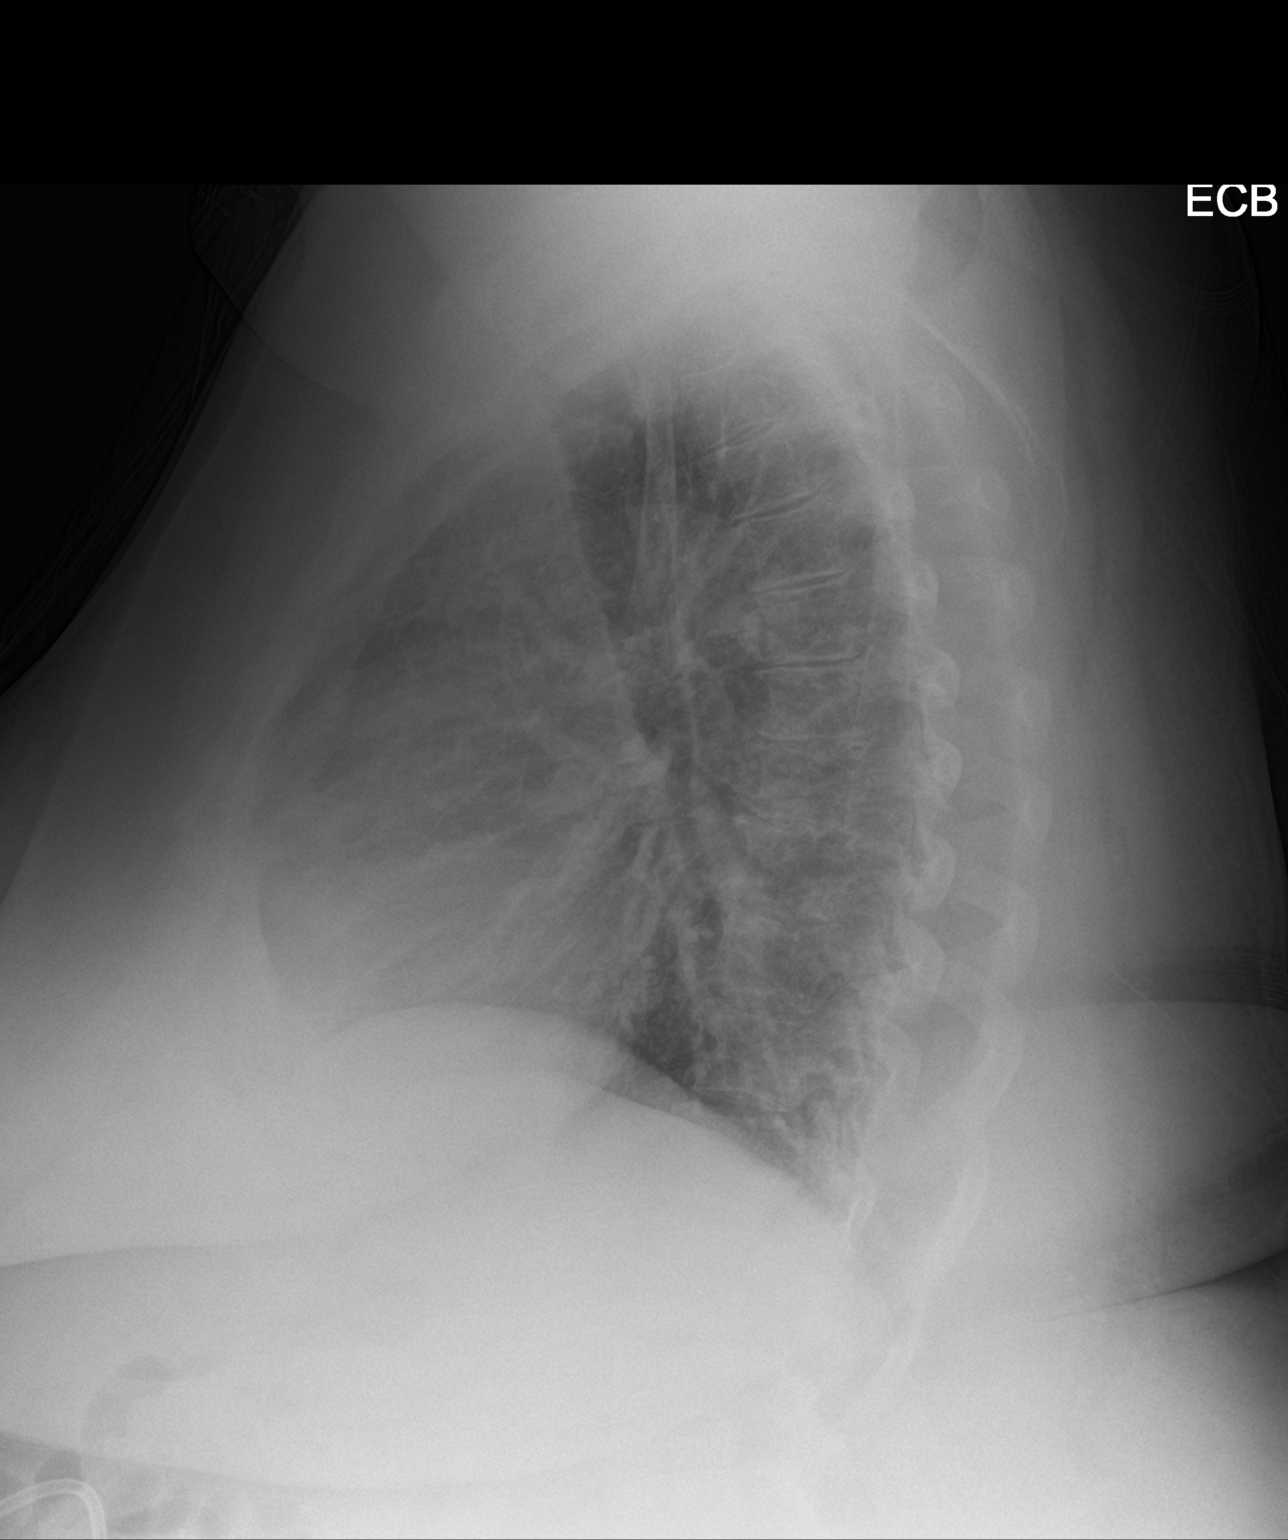

[2 of 2 positions shown; findings below may reference images not displayed]

FINDINGS: The heart size and mediastinal contours are within normal limits.
Both lungs are clear. The visualized skeletal structures are
unremarkable.
IMPRESSION: No active cardiopulmonary disease.

## 2019-05-07 MED ORDER — TRELEGY ELLIPTA 100-62.5-25 MCG/INH IN AEPB: 1.0000 | INHALATION_SPRAY | Freq: Every day | RESPIRATORY_TRACT | 3 refills | Status: DC

## 2019-05-07 MED ORDER — ALBUTEROL SULFATE HFA 108 (90 BASE) MCG/ACT IN AERS: 2.0000 | INHALATION_SPRAY | Freq: Four times a day (QID) | RESPIRATORY_TRACT | 2 refills | Status: DC | PRN

## 2019-05-07 NOTE — Telephone Encounter (Signed)
Spoke with pt and advised rx for Trelegy and albuterol sent to pharmacy. Nothing further is needed.

## 2019-05-07 NOTE — Telephone Encounter (Signed)
Pt has returned the call - please call back [phone]

## 2019-05-07 NOTE — Telephone Encounter (Signed)
Left message for patient to call back  

## 2019-05-08 ENCOUNTER — Encounter: Payer: Self-pay | Admitting: Internal Medicine

## 2019-05-08 ENCOUNTER — Ambulatory Visit
Admission: EM | Admit: 2019-05-08 | Discharge: 2019-05-08 | Disposition: A | Discharge status: Home / Self Care | Payer: Medicare Other | Attending: Emergency Medicine | Admitting: Emergency Medicine

## 2019-05-08 ENCOUNTER — Ambulatory Visit: Payer: Medicare HMO | Admitting: Internal Medicine

## 2019-05-08 DIAGNOSIS — B351 Tinea unguium: Secondary | ICD-10-CM | POA: Diagnosis not present

## 2019-05-08 DIAGNOSIS — M109 Gout, unspecified: Secondary | ICD-10-CM

## 2019-05-08 DIAGNOSIS — N3281 Overactive bladder: Secondary | ICD-10-CM

## 2019-05-08 DIAGNOSIS — Z760 Encounter for issue of repeat prescription: Secondary | ICD-10-CM

## 2019-05-08 DIAGNOSIS — M1A09X0 Idiopathic chronic gout, multiple sites, without tophus (tophi): Secondary | ICD-10-CM

## 2019-05-08 MED ORDER — DICLOFENAC SODIUM 50 MG PO TBEC: 50.0000 mg | DELAYED_RELEASE_TABLET | Freq: Every day | ORAL | 0 refills | Status: DC

## 2019-05-08 MED ORDER — FEBUXOSTAT 40 MG PO TABS: 40.0000 mg | ORAL_TABLET | Freq: Every day | ORAL | 0 refills | Status: DC

## 2019-05-08 MED ORDER — COLCHICINE 0.6 MG PO TABS: ORAL_TABLET | ORAL | 0 refills | Status: DC

## 2019-05-08 MED ORDER — DICLOFENAC SODIUM 3 % EX GEL: 1.0000 "application " | Freq: Every day | CUTANEOUS | 0 refills | Status: DC | PRN

## 2019-05-08 MED ORDER — DICLOFENAC SODIUM 1 % EX GEL: 2.0000 g | Freq: Four times a day (QID) | CUTANEOUS | 0 refills | Status: DC

## 2019-05-08 MED ORDER — EFINACONAZOLE 10 % EX SOLN: 1.0000 "application " | Freq: Every day | CUTANEOUS | 0 refills | Status: DC

## 2019-05-08 NOTE — Discharge Instructions (Addendum)
Very important to keep appointment on 4/28 with primary care - this is where you will get medication refills done in the future.

## 2019-05-08 NOTE — ED Triage Notes (Signed)
Pt c/o gout all over x2 days and fungus under her nails. Pt states out of her gout meds.

## 2019-05-08 NOTE — ED Provider Notes (Signed)
[facility]    CSN: [id number] Arrival date & time: 05/08/19  1034      History   Chief Complaint Chief Complaint  Patient presents with  . Gout    HPI Tiffany [name] is a 59 y.o. female with extensive medical history as outlined below including COPD, diabetes, obesity, hypertension, gout presenting for medication refill.  Patient states she has a history of fungal infection on fingernails and toenails which are painful.  States she used to take terbinafine for this, though has been out of it for a while.  Patient also requesting refill for gout medications as she has had diffuse joint pain for the last 2 days.  Patient last seen by me for this on 4/1.  Did not have primary care at that time.  Per chart review, patient has a primary care appointment on 4/28: she intends to keep this appointment.  Patient has tolerated medications well in the past.  Denies chest pain, shortness of breath, severe abdominal pain, urinary symptoms, change in bowel habit.    Past Medical History:  Diagnosis Date  . Agitation 11/22/2017  . AKI (acute kidney injury) ([place]) 11/21/2017  . Anemia 04/24/2011  . Anoxic brain injury ([place]) 09/08/2016   C. Arrest due to respiratory failure and COPD exacerbation  . Anxiety   . Arthritis    "all over" (04/10/2016)  . Asthma 10/18/2010  . Binge eating disorder   . Cardiac arrest ([place]) 09/08/2016   PEA  . Carotid artery stenosis    1-39% bilateral by dopplers 11/2016  . Chronic bronchitis ([place])   . Chronic diastolic (congestive) heart failure ([place])   . Chronic kidney disease    "I see a kidney dr." (04/10/2016)  . Chronic pain syndrome 06/18/2012  . Chronic post-traumatic stress disorder (PTSD) 05/27/2018  . Chronic respiratory failure with hypoxia and hypercapnia (HCC) 06/22/2015   TRILOGY Vent >AVAPA-ES., Vt target 200-400, Max P 30 , PS max 20 , PS min 6-10 , E Max 6, E Min 4, Rate Auto AVAPS Rate 2 (titrate for pt comfort) , bleed O2 at 5l/m continuous  flow .   [name] CKD (chronic kidney disease) stage 3, GFR 30-59 ml/min 12/15/2016  . Closed displaced fracture of fifth metacarpal bone 03/21/2018  . Cocaine use disorder, severe, in sustained remission ([place]) 12/17/2015  . Complication of anesthesia    decreased bp, decreased heart rate  . Constipation   . COPD (chronic obstructive pulmonary disease) ([place]) 07/08/2014  . Depression   . Diabetic neuropathy ([place]) 04/24/2011  . Difficulty with speech 01/24/2018  . Disorder of nervous system   . Drug abuse ([place]) 11/21/2017  . Dyslipidemia 04/24/2011  . Dyspnea   . Elevated troponin 04/28/2012  . Emphysema   . Encephalopathy 11/21/2017  . Essential hypertension 03/22/2016  . Fall   . Fibula fracture 07/10/2016  . Frequent falls 10/11/2017  . GERD (gastroesophageal reflux disease)   . Gout 04/11/2017  . Heart attack ([place]) 1980s  . History of blood transfusion 1994   "couldn't stop bleeding from my period"  . History of drug abuse in remission ([place]) 11/28/2015   Quit in 2017  . Hyperlipidemia LDL goal <70   . Hypertension   . Incontinence   . Manic depression ([place])   . Morbid obesity ([place]) 10/18/2010  . Obstructive sleep apnea 10/18/2010  . On home oxygen therapy    "6L; 24/7" (04/10/2016)  . OSA on CPAP    "wear mask sometimes" (04/10/2016)  .  Paranoid ([place])    "sometimes; I'm on RX for it" (04/10/2016)  . Pneumonia    "I've had it several times; haven't had it since 06/2015" (04/10/2016)  . Prolonged Q-T interval on ECG   . QT prolongation   . Rectal bleeding 12/31/2015  . Right carotid bruit 11/09/2016  . Right foot pain 11/15/2016  . Scabies 06/01/2015  . Schizoaffective disorder, bipolar type ([place]) 04/05/2018  . Seasonal allergies   . Seborrheic keratoses 12/31/2013  . Seizure ([place]) 01/04/2013  . Seizures ([place])    "don't know what kind; last one was ~ 1 yr ago" (04/10/2016)  . Sinus trouble   . Skin irritation 04/07/2012  . SOB (shortness of breath) 04/10/2016  . Stroke [facility]) 1980s   denies  residual on 04/10/2016  . Syncope 01/06/2017  . Thrush 09/19/2013  . Tobacco use disorder 07/22/2014  . Type 2 diabetes mellitus ([place]) 10/18/2010  . Type II diabetes mellitus ([place])   . Upper abdominal pain   . Vaginal discharge 01/03/2018  . Wheezing 04/12/2016  . Wound, breast 01/03/2018    Patient Active Problem List   Diagnosis Date Noted  . Family discord 02/04/2019  . Aggressive behavior   . PTSD (post-traumatic stress disorder) 05/27/2018  . Schizoaffective disorder, bipolar type ([place]) 04/05/2018  . Closed displaced fracture of fifth metacarpal bone 03/21/2018  . Difficulty with speech 01/24/2018  . Vaginal discharge 01/03/2018  . AKI (acute kidney injury) ([place]) 11/21/2017  . Encephalopathy 11/21/2017  . Drug abuse ([place]) 11/21/2017  . Frequent falls 10/11/2017  . Binge eating disorder   . Dependence on continuous supplemental oxygen 05/14/2017  . Gout 04/11/2017  . Palliative care encounter 03/12/2017  . CKD (chronic kidney disease) stage 3, GFR 30-59 ml/min 12/15/2016  . Carotid artery stenosis   . Osteoarthritis 10/26/2016  . Anoxic brain injury ([place]) 09/08/2016  . Overactive bladder 06/07/2016  . QT prolongation   . OSA and COPD overlap syndrome ([place])   . Arthritis   . Essential hypertension 03/22/2016  . Cocaine use disorder, severe, in sustained remission ([place]) 12/17/2015  . History of drug abuse in remission ([place]) 11/28/2015  . Chronic diastolic congestive heart failure ([place])   . Chronic respiratory failure with hypoxia and hypercapnia ([place]) 06/22/2015  . Tobacco use disorder 07/22/2014  . COPD (chronic obstructive pulmonary disease) ([place]) 07/08/2014  . Seizure ([place]) 01/04/2013  . Chronic pain syndrome 06/18/2012  . Dyslipidemia 04/24/2011  . Anemia 04/24/2011  . Diabetic neuropathy ([place]) 04/24/2011  . Asthma 10/18/2010  . Morbid obesity ([place]) 10/18/2010  . Type 2 diabetes mellitus ([place]) 10/18/2010    Past Surgical History:  Procedure Laterality Date  .  CESAREAN SECTION  1997  . HERNIA REPAIR    . IR CHOLANGIOGRAM EXISTING TUBE  07/20/2016  . IR PERC CHOLECYSTOSTOMY  05/10/2016  . IR RADIOLOGIST EVAL & MGMT  06/08/2016  . IR RADIOLOGIST EVAL & MGMT  06/29/2016  . IR SINUS/FIST TUBE CHK-NON GI  07/12/2016  . RIGHT/LEFT HEART CATH AND CORONARY ANGIOGRAPHY N/A 06/19/2017   Procedure: RIGHT/LEFT HEART CATH AND CORONARY ANGIOGRAPHY;  Surgeon: Jolaine Artist, MD;  Location: [facility];  Service: Cardiovascular;  Laterality: N/A;  . TIBIA IM NAIL INSERTION Right 07/12/2016   Procedure: INTRAMEDULLARY (IM) NAIL RIGHT TIBIA;  Surgeon: Leandrew Koyanagi, MD;  Location: [facility];  Service: Orthopedics;  Laterality: Right;  . UMBILICAL HERNIA REPAIR  ~ 1963   "that's why I don't have a belly button"  . VAGINAL  HYSTERECTOMY      OB History    Gravida  4   Para  4   Term  3   Preterm  1   AB  0   Living  3     SAB  0   TAB  0   Ectopic  0   Multiple  0   Live Births  3            Home Medications    Prior to Admission medications   Medication Sig Start Date End Date Taking? Authorizing Provider  albuterol (PROVENTIL) (2.5 MG/3ML) 0.083% nebulizer solution Take 3 mLs (2.5 mg total) by nebulization every 6 (six) hours as needed for wheezing or shortness of breath. 04/29/19   Magdalen Spatz, NP  albuterol (VENTOLIN HFA) 108 (90 Base) MCG/ACT inhaler Inhale 2 puffs into the lungs every 6 (six) hours as needed for wheezing or shortness of breath. 05/07/19   Martyn Ehrich, NP  amLODipine (NORVASC) 5 MG tablet TAKE 1 TABLET (5 MG TOTAL) BY MOUTH EVERY MORNING 04/17/19   Hall-Potvin, [place], PA-C  Asenapine Maleate 10 MG SUBL Place 1 tablet (10 mg total) under the tongue 3 (three) times daily. 04/01/19 06/30/19  Pucilowski, Marchia Bond, MD  aspirin (GOODSENSE ASPIRIN LOW DOSE) 81 MG EC tablet TAKE 1 TABLET (81 MG TOTAL) BY MOUTH DAILY (MORNING). Patient taking differently: Take 81 mg by mouth daily.  11/12/17   Zenia Resides, MD   atorvastatin (LIPITOR) 80 MG tablet Take 1 tablet (80 mg total) by mouth daily. 12/10/18   Maximiano Coss, NP  budesonide-formoterol [facility]) 80-4.5 MCG/ACT inhaler Inhale 2 puffs into the lungs 2 (two) times daily. 11/15/18 12/15/18  Maximiano Coss, NP  colchicine (COLCRYS) 0.6 MG tablet TAKE 1 TABLET (0.6 MG TOTAL) BY MOUTH 2 (TWO) TIMES DAILY. 05/08/19   Hall-Potvin, [place], PA-C  diazepam (VALIUM) 10 MG tablet Take 1 tablet (10 mg total) by mouth 3 (three) times daily. 04/29/19 07/28/19  Pucilowski, Marchia Bond, MD  diclofenac (VOLTAREN) 50 MG EC tablet Take 1 tablet (50 mg total) by mouth daily. 05/08/19   Hall-Potvin, [place], PA-C  diclofenac Sodium (VOLTAREN) 1 % GEL Apply 2 g topically 4 (four) times daily. 05/08/19   Hall-Potvin, [place], PA-C  Diclofenac Sodium 3 % GEL Apply 1 application topically daily as needed. 05/08/19   Hall-Potvin, [place], PA-C  docusate sodium (COLACE) 100 MG capsule Take 1 capsule (100 mg total) by mouth daily as needed for mild constipation or moderate constipation. 12/10/18   Maximiano Coss, NP  doxazosin (CARDURA) 2 MG tablet Take 1 tablet (2 mg total) by mouth daily. 11/15/18 12/15/18  Maximiano Coss, NP  Efinaconazole 10 % SOLN Apply 1 application topically daily. 05/08/19   Hall-Potvin, [place], PA-C  escitalopram (LEXAPRO) 20 MG tablet Take 2 tablets (40 mg total) by mouth daily. 04/03/19 07/02/19  Pucilowski, Marchia Bond, MD  febuxostat (ULORIC) 40 MG tablet Take 1 tablet (40 mg total) by mouth daily. 05/08/19   Hall-Potvin, [place], PA-C  ferrous sulfate 325 (65 FE) MG tablet Take 1 tablet (325 mg total) by mouth daily with breakfast. 12/04/18   Maximiano Coss, NP  flunisolide (NASALIDE) 25 MCG/ACT (0.025%) SOLN Place 2 sprays into the nose daily. 06/26/17   McDiarmid, Blane Ohara, MD  Fluticasone-Umeclidin-Vilant (TRELEGY ELLIPTA) 100-62.5-25 MCG/INH AEPB Inhale 1 puff into the lungs daily. 05/07/19   Martyn Ehrich, NP  gabapentin (NEURONTIN) 600 MG  tablet Take 1 tablet (600 mg total) by mouth 2 (  two) times daily. 11/15/18   Maximiano Coss, NP  lidocaine (LIDODERM) 5 % Place 1 patch onto the skin every 12 (twelve) hours.  01/15/19   [provider]  lisdexamfetamine (VYVANSE) 70 MG capsule Take 1 capsule (70 mg total) by mouth daily. 05/16/19 06/15/19  Pucilowski, Marchia Bond, MD  meloxicam (MOBIC) 7.5 MG tablet Take 1 tablet (7.5 mg total) by mouth daily for 30 doses. 04/24/19 05/24/19  Aundra Dubin, PA-C  metolazone (ZAROXOLYN) 2.5 MG tablet Take 1 tablet (2.5 mg total) by mouth daily. Patient taking differently: Take 2.5 mg by mouth 3 (three) times a week.  04/22/18   Robyn Haber, MD  Multiple Vitamin (MULTIVITAMIN WITH MINERALS) TABS tablet Take 1 tablet by mouth daily.    [provider]  omeprazole (PRILOSEC) 40 MG capsule Take 1 capsule (40 mg total) by mouth 2 (two) times daily. 12/04/18   Maximiano Coss, NP  oxybutynin (DITROPAN-XL) 10 MG 24 hr tablet Take 1-2 tablets (10-20 mg total) by mouth at bedtime. Patient taking differently: Take 10 mg by mouth 2 (two) times daily.  12/10/18   Maximiano Coss, NP  polyethylene glycol (MIRALAX / GLYCOLAX) 17 g packet Take 17 g by mouth daily as needed for mild constipation or moderate constipation. 12/10/18   Maximiano Coss, NP  potassium chloride (K-DUR) 10 MEQ tablet Take 10 mEq by mouth 2 (two) times daily.    [provider]  prazosin (MINIPRESS) 5 MG capsule Take 1 capsule (5 mg total) by mouth 2 (two) times daily. 04/01/19 06/30/19  Pucilowski, Marchia Bond, MD  torsemide (DEMADEX) 20 MG tablet Take 4 tablets (80 mg total) by mouth 2 (two) times daily. 11/15/18   Maximiano Coss, NP  traZODone (DESYREL) 100 MG tablet Take 1 tablet (100 mg total) by mouth at bedtime. 04/29/19 07/28/19  Pucilowski, Marchia Bond, MD  traZODone (DESYREL) 50 MG tablet Take 1 tablet (50 mg total) by mouth daily. 04/29/19 07/28/19  Pucilowski, Marchia Bond, MD    Family History Family History   Problem Relation Age of Onset  . Cancer Father        prostate  . Cancer Mother        lung  . Depression Mother   . Depression Sister   . Anxiety disorder Sister   . Schizophrenia Sister   . Bipolar disorder Sister   . Depression Sister   . Depression Brother   . Heart failure Other        cousin    Social History Social History   Tobacco Use  . Smoking status: Former Smoker    Packs/day: 1.50    Years: 38.00    Pack years: 57.00    Types: Cigarettes    Start date: 03/13/1977    Quit date: 04/10/2016    Years since quitting: 3.0  . Smokeless tobacco: Never Used  Substance Use Topics  . Alcohol use: No    Alcohol/week: 0.0 standard drinks  . Drug use: No    Types: Cocaine    Comment: 04/10/2016 "last used cocaine back in November 2017"     Allergies   Hydrocodone, Hydrocodone-acetaminophen, Hydroxyzine, Latuda [lurasidone hcl], Lurasidone, Magnesium-containing compounds, Prednisone, Tramadol, Codeine, Other, Sulfa antibiotics, and Tape   Review of Systems As per HPI   Physical Exam Triage Vital Signs ED Triage Vitals [05/08/19 1048]  Enc Vitals Group     BP 137/89     Pulse Rate 66     Resp 18     Temp  98.5 F (36.9 C)     Temp Source Oral     SpO2      Weight      Height      Head Circumference      Peak Flow      Pain Score      Pain Loc      Pain Edu?      Excl. in [place]?    No data found.  Updated Vital Signs BP 137/89 (BP Location: Left Arm)   Pulse 66   Temp 98.5 F (36.9 C) (Oral)   Resp 18   SpO2 97%   Visual Acuity Right Eye Distance:   Left Eye Distance:   Bilateral Distance:    Right Eye Near:   Left Eye Near:    Bilateral Near:     Physical Exam Constitutional:      General: [name] not in acute distress.    Appearance: [name] obese.  HENT:     Head: Normocephalic and atraumatic.  Eyes:     General: No scleral icterus.    Pupils: Pupils are equal, round, and reactive to light.  Cardiovascular:     Rate and Rhythm:  Normal rate and regular rhythm.  Pulmonary:     Effort: Pulmonary effort is normal. No respiratory distress.     Breath sounds: No wheezing.  Musculoskeletal:        General: Tenderness present. No swelling or deformity.     Comments: Patient endorsing diffuse tenderness of joints.  No appreciable swelling, crepitus, edema.  Skin:    Capillary Refill: Capillary refill takes less than 2 seconds.     Coloration: Skin is not jaundiced or pale.     Findings: No erythema.     Comments: Mild onychomycosis at distal aspect of fingernails.  Patient endorsing tenderness.  No fluctuance, induration, open wounds or active discharge.  Neurological:     General: No focal deficit present.     Mental Status: [name] alert and oriented to person, place, and time.      UC Treatments / Results  Labs (all labs ordered are listed, but only abnormal results are displayed) Labs Reviewed - No data to display  EKG   Radiology No results found.  Procedures Procedures (including critical care time)  Medications Ordered in UC Medications - No data to display  Initial Impression / Assessment and Plan / UC Course  I have reviewed the triage vital signs and the nursing notes.  Pertinent labs & imaging results that were available during my care of the patient were reviewed by me and considered in my medical decision making (see chart for details).     Patient afebrile, nontoxic in office today.  Patient requesting refill of Medications which she is chronically tolerated well.  Reviewed specific dosing, particularly for colchicine for acute flares.  Discussed trialing topical antifungal for mild onychomycosis prior to systemic terbinafine.  Discussed patient is to keep new patient appointment with PCP on 4/28 for future medication refills as well as work-up and maintenance of chronic conditions.  Return precautions discussed, patient verbalized understanding and is agreeable to plan. Final Clinical  Impressions(s) / UC Diagnoses   Final diagnoses:  Chronic gout of multiple sites, unspecified cause  Fungal toenail infection  Encounter for medication refill     Discharge Instructions     Very important to keep appointment on 4/28 with primary care - this is where you will get medication refills done in the  future.     ED Prescriptions    Medication Sig Dispense Auth. Provider   colchicine (COLCRYS) 0.6 MG tablet TAKE 1 TABLET (0.6 MG TOTAL) BY MOUTH 2 (TWO) TIMES DAILY. 15 tablet Hall-Potvin, [place], PA-C   diclofenac Sodium (VOLTAREN) 1 % GEL Apply 2 g topically 4 (four) times daily. 100 g Hall-Potvin, [place], PA-C   febuxostat (ULORIC) 40 MG tablet Take 1 tablet (40 mg total) by mouth daily. 30 tablet Hall-Potvin, [place], PA-C   diclofenac (VOLTAREN) 50 MG EC tablet Take 1 tablet (50 mg total) by mouth daily. 15 tablet Hall-Potvin, [place], PA-C   Diclofenac Sodium 3 % GEL Apply 1 application topically daily as needed. 100 g Hall-Potvin, [place], PA-C   Efinaconazole 10 % SOLN Apply 1 application topically daily. 8 mL Hall-Potvin, [place], PA-C     PDMP not reviewed this encounter.   Hall-Potvin, [place], [place] 05/08/19 1148

## 2019-05-09 ENCOUNTER — Telehealth: Payer: Self-pay | Admitting: Orthopaedic Surgery

## 2019-05-09 NOTE — Telephone Encounter (Signed)
U/S? It was neg. Is this the test she is referring to ?

## 2019-05-09 NOTE — Telephone Encounter (Signed)
Patient called.   [name] wanting to know the results of the test ran on her at the time of her last appointment.   Call back: [phone]

## 2019-05-10 NOTE — Telephone Encounter (Signed)
That is only test we referred her for, so must be?

## 2019-05-13 IMAGING — CR DG CHEST 2V
2 series · 2 of 2 positions shown · non-contrast
Comparison: 05/24/2016

CLINICAL DATA: Midsternal left-sided chest pain

EXAM:
CHEST  2 VIEW

[chest lat]
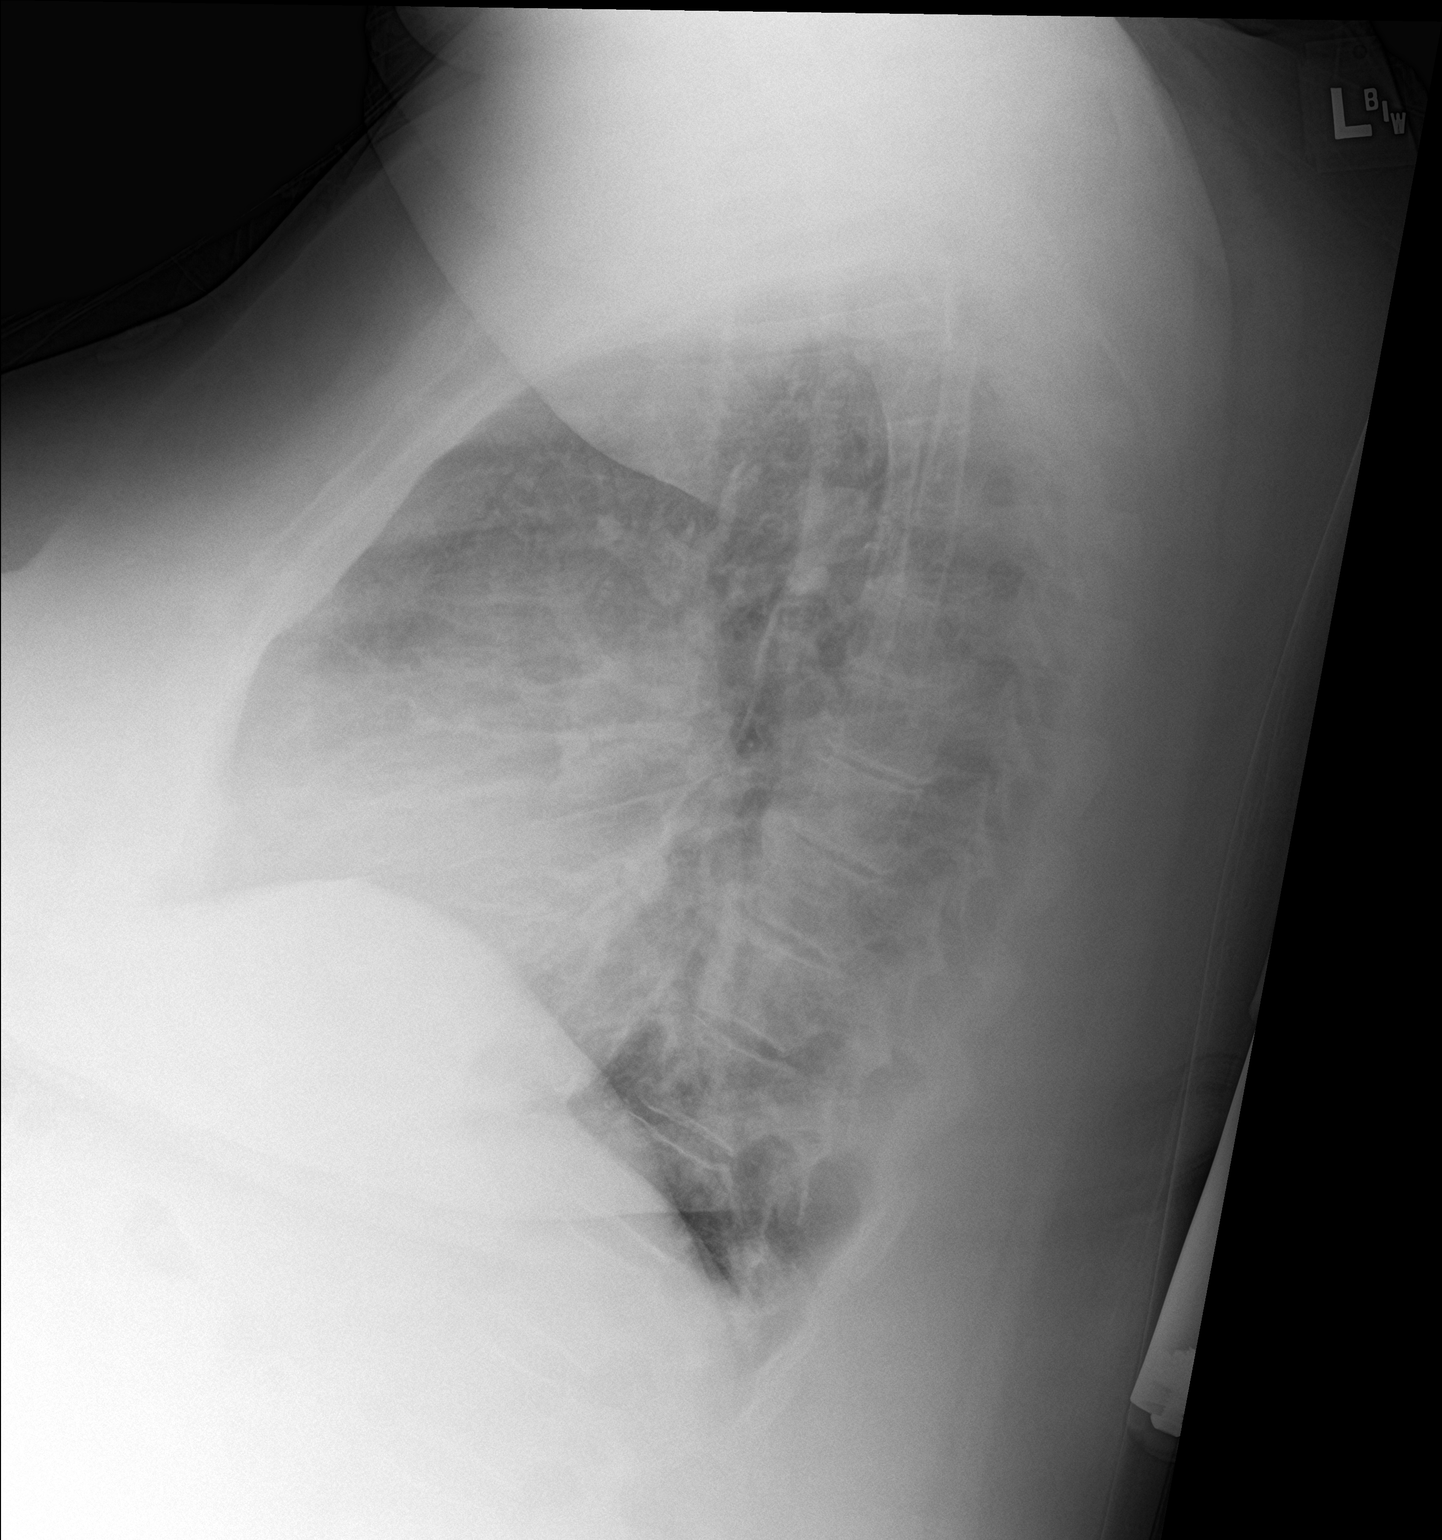

[chest ap]
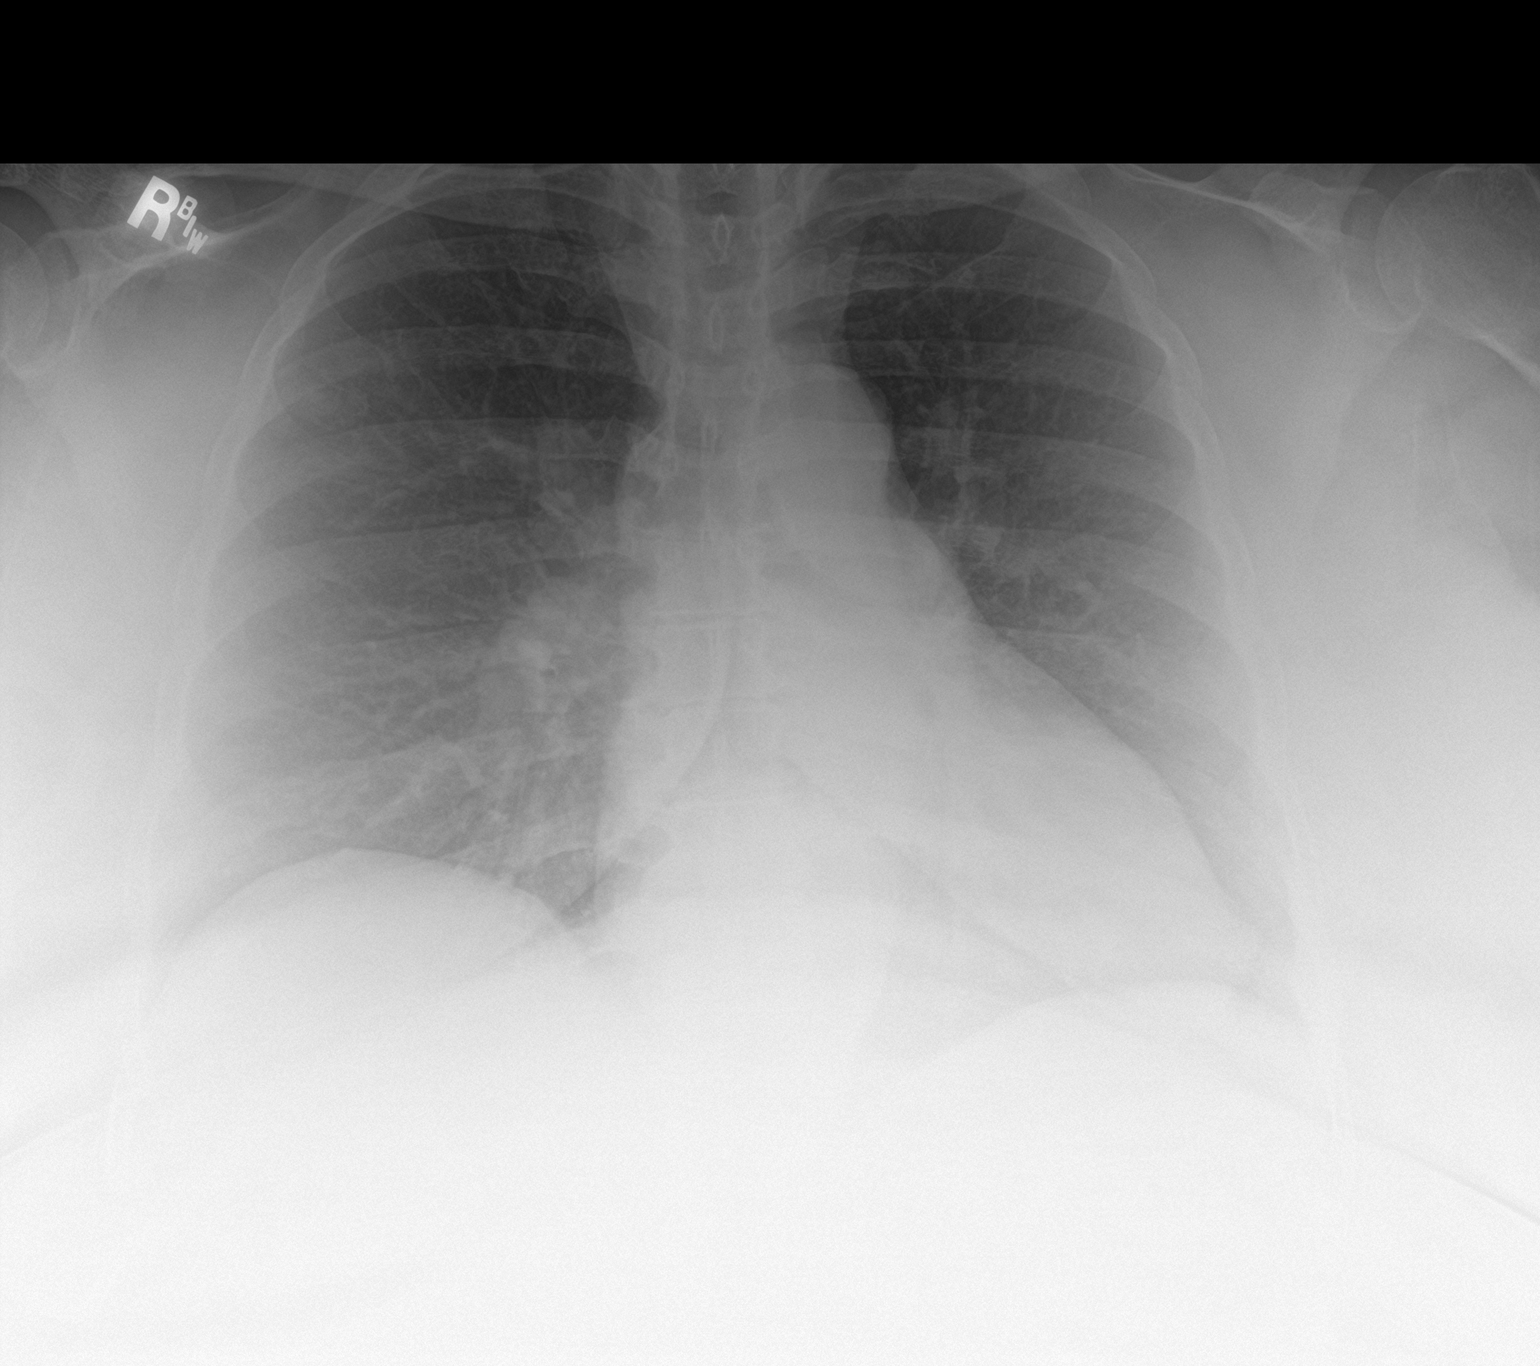

[2 of 2 positions shown; findings below may reference images not displayed]

FINDINGS: The heart size and mediastinal contours are within normal limits.
Both lungs are clear. Mild degenerative changes are seen along the
mid thoracic spine.
IMPRESSION: No active cardiopulmonary disease.

## 2019-05-14 ENCOUNTER — Encounter: Payer: Self-pay | Admitting: Internal Medicine

## 2019-05-14 ENCOUNTER — Other Ambulatory Visit: Payer: Self-pay | Admitting: Registered Nurse

## 2019-05-14 ENCOUNTER — Telehealth (INDEPENDENT_AMBULATORY_CARE_PROVIDER_SITE_OTHER): Payer: Medicare Other | Admitting: Internal Medicine

## 2019-05-14 DIAGNOSIS — E785 Hyperlipidemia, unspecified: Secondary | ICD-10-CM

## 2019-05-14 DIAGNOSIS — R5381 Other malaise: Secondary | ICD-10-CM

## 2019-05-14 DIAGNOSIS — I5032 Chronic diastolic (congestive) heart failure: Secondary | ICD-10-CM

## 2019-05-14 DIAGNOSIS — Z7689 Persons encountering health services in other specified circumstances: Secondary | ICD-10-CM | POA: Diagnosis not present

## 2019-05-14 DIAGNOSIS — B351 Tinea unguium: Secondary | ICD-10-CM

## 2019-05-14 DIAGNOSIS — M1990 Unspecified osteoarthritis, unspecified site: Secondary | ICD-10-CM

## 2019-05-14 DIAGNOSIS — M109 Gout, unspecified: Secondary | ICD-10-CM | POA: Diagnosis not present

## 2019-05-14 DIAGNOSIS — I10 Essential (primary) hypertension: Secondary | ICD-10-CM

## 2019-05-14 MED ORDER — AMLODIPINE BESYLATE 10 MG PO TABS: ORAL_TABLET | ORAL | 0 refills | Status: DC

## 2019-05-14 MED ORDER — COLCRYS 0.6 MG PO TABS: 0.6000 mg | ORAL_TABLET | Freq: Two times a day (BID) | ORAL | 0 refills | Status: DC

## 2019-05-14 MED ORDER — DICLOFENAC SODIUM 3 % EX GEL: 1.0000 "application " | Freq: Every day | CUTANEOUS | 0 refills | Status: DC | PRN

## 2019-05-14 MED ORDER — TERBINAFINE HCL 250 MG PO TABS: 250.0000 mg | ORAL_TABLET | Freq: Every day | ORAL | 0 refills | Status: DC

## 2019-05-14 MED ORDER — DICLOFENAC SODIUM 75 MG PO TBEC: 75.0000 mg | DELAYED_RELEASE_TABLET | Freq: Two times a day (BID) | ORAL | 1 refills | Status: DC

## 2019-05-14 NOTE — Progress Notes (Signed)
Virtual Visit via Telephone Note  I connected with Tiffany [name], on 05/14/2019 at 2:58 PM by telephone due to the COVID-19 pandemic and verified that I am speaking with the correct person using two identifiers.   Consent: I discussed the limitations, risks, security and privacy concerns of performing an evaluation and management service by telephone and the availability of in person appointments. I also discussed with the patient that there may be a patient responsible charge related to this service. The patient expressed understanding and agreed to proceed.   Location of Patient: Home   Location of Provider: Clinic    Persons participating in Telemedicine visit: Aleisha Paone [facility] Dr. Juleen China      History of Present Illness: Patient has a visit to establish care. Patient has a lot of concerns about the dosing of her medications, reporting that urgent care changed her doses.   She has a history of gout. She requests the name brand of Colchicine as it works better for her.   Also has a fungus underneath her toenails and fingernails. She was prescribed Efinaconazole solution and that has not been working.   She reports her Amlodipine dose should be 10 mg instead of 5 mg.   Patient is a poor historian. Suspect related to her schizophrenia and history of drug abuse. She was difficult to redirect at times and spent a significant amount of time talking about what I gathered to be her roommate and issues with her roommate and her living habits.    Past Medical History:  Diagnosis Date  . Agitation 11/22/2017  . Anoxic brain injury ([place]) 09/08/2016   C. Arrest due to respiratory failure and COPD exacerbation  . Anxiety   . Arthritis    "all over" (04/10/2016)  . Asthma 10/18/2010  . Binge eating disorder   . Cardiac arrest ([place]) 09/08/2016   PEA  . Carotid artery stenosis    1-39% bilateral by dopplers 11/2016  . Chronic diastolic (congestive) heart failure ([place])    . Chronic kidney disease    "I see a kidney dr." (04/10/2016)  . Chronic pain syndrome 06/18/2012  . Chronic post-traumatic stress disorder (PTSD) 05/27/2018  . Chronic respiratory failure with hypoxia and hypercapnia (HCC) 06/22/2015   TRILOGY Vent >AVAPA-ES., Vt target 200-400, Max P 30 , PS max 20 , PS min 6-10 , E Max 6, E Min 4, Rate Auto AVAPS Rate 2 (titrate for pt comfort) , bleed O2 at 5l/m continuous flow .   [name] CKD (chronic kidney disease) stage 3, GFR 30-59 ml/min 12/15/2016  . Closed displaced fracture of fifth metacarpal bone 03/21/2018  . Cocaine use disorder, severe, in sustained remission ([place]) 12/17/2015  . Complication of anesthesia    decreased bp, decreased heart rate  . COPD (chronic obstructive pulmonary disease) ([place]) 07/08/2014  . Depression   . Diabetic neuropathy ([place]) 04/24/2011  . Difficulty with speech 01/24/2018  . Disorder of nervous system   . Drug abuse ([place]) 11/21/2017  . Dyslipidemia 04/24/2011  . Elevated troponin 04/28/2012  . Emphysema   . Encephalopathy 11/21/2017  . Essential hypertension 03/22/2016  . Fibula fracture 07/10/2016  . Frequent falls 10/11/2017  . GERD (gastroesophageal reflux disease)   . Gout 04/11/2017  . Heart attack ([place]) 1980s  . History of blood transfusion 1994   "couldn't stop bleeding from my period"  . History of drug abuse in remission ([place]) 11/28/2015   Quit in 2017  . Hyperlipidemia LDL goal <70   .  Incontinence   . Manic depression ([place])   . Morbid obesity ([place]) 10/18/2010  . Obstructive sleep apnea 10/18/2010  . On home oxygen therapy    "6L; 24/7" (04/10/2016)  . OSA on CPAP    "wear mask sometimes" (04/10/2016)  . Paranoid ([place])    "sometimes; I'm on RX for it" (04/10/2016)  . Prolonged Q-T interval on ECG   . Rectal bleeding 12/31/2015  . Right carotid bruit 11/09/2016  . Schizoaffective disorder, bipolar type ([place]) 04/05/2018  . Seasonal allergies   . Seborrheic keratoses 12/31/2013  . Seizures ([place])    "don't know what  kind; last one was ~ 1 yr ago" (04/10/2016)  . Stroke [facility]) 1980s   denies residual on 04/10/2016  . Thrush 09/19/2013  . Type 2 diabetes mellitus ([place]) 10/18/2010   Allergies  Allergen Reactions  . Hydrocodone Shortness Of Breath  . Hydrocodone-Acetaminophen Shortness Of Breath  . Hydroxyzine Anaphylaxis and Shortness Of Breath  . Latuda [Lurasidone Hcl] Anaphylaxis  . Lurasidone Anaphylaxis  . Magnesium-Containing Compounds Anaphylaxis  . Prednisone Anaphylaxis, Swelling and Other (See Comments)    Tongue swelling  . Tramadol Anaphylaxis and Swelling  . Codeine Nausea And Vomiting  . Other Rash  . Sulfa Antibiotics Itching  . Tape Rash    Current Outpatient Medications on File Prior to Visit  Medication Sig Dispense Refill  . albuterol (PROVENTIL) (2.5 MG/3ML) 0.083% nebulizer solution Take 3 mLs (2.5 mg total) by nebulization every 6 (six) hours as needed for wheezing or shortness of breath. 75 mL 5  . albuterol (VENTOLIN HFA) 108 (90 Base) MCG/ACT inhaler Inhale 2 puffs into the lungs every 6 (six) hours as needed for wheezing or shortness of breath. 18 g 2  . amLODipine (NORVASC) 5 MG tablet TAKE 1 TABLET (5 MG TOTAL) BY MOUTH EVERY MORNING 30 tablet 0  . Asenapine Maleate 10 MG SUBL Place 1 tablet (10 mg total) under the tongue 3 (three) times daily. 90 tablet 2  . aspirin (GOODSENSE ASPIRIN LOW DOSE) 81 MG EC tablet TAKE 1 TABLET (81 MG TOTAL) BY MOUTH DAILY (MORNING). (Patient taking differently: Take 81 mg by mouth daily. ) 90 tablet 3  . atorvastatin (LIPITOR) 80 MG tablet Take 1 tablet (80 mg total) by mouth daily. 90 tablet 0  . budesonide-formoterol (SYMBICORT) 80-4.5 MCG/ACT inhaler Inhale 2 puffs into the lungs 2 (two) times daily. 1 Inhaler 2  . colchicine (COLCRYS) 0.6 MG tablet TAKE 1 TABLET (0.6 MG TOTAL) BY MOUTH 2 (TWO) TIMES DAILY. 15 tablet 0  . diazepam (VALIUM) 10 MG tablet Take 1 tablet (10 mg total) by mouth 3 (three) times daily. 90 tablet 2  . diclofenac  (VOLTAREN) 50 MG EC tablet Take 1 tablet (50 mg total) by mouth daily. 15 tablet 0  . diclofenac Sodium (VOLTAREN) 1 % GEL Apply 2 g topically 4 (four) times daily. 100 g 0  . Diclofenac Sodium 3 % GEL Apply 1 application topically daily as needed. 100 g 0  . doxazosin (CARDURA) 2 MG tablet Take 1 tablet (2 mg total) by mouth daily. 30 tablet 0  . Efinaconazole 10 % SOLN Apply 1 application topically daily. 8 mL 0  . escitalopram (LEXAPRO) 20 MG tablet Take 2 tablets (40 mg total) by mouth daily. 180 tablet 0  . febuxostat (ULORIC) 40 MG tablet Take 1 tablet (40 mg total) by mouth daily. 30 tablet 0  . ferrous sulfate 325 (65 FE) MG tablet Take 1 tablet (325 mg total)  by mouth daily with breakfast. 90 tablet 3  . Fluticasone-Umeclidin-Vilant (TRELEGY ELLIPTA) 100-62.5-25 MCG/INH AEPB Inhale 1 puff into the lungs daily. 60 each 3  . gabapentin (NEURONTIN) 600 MG tablet Take 1 tablet (600 mg total) by mouth 2 (two) times daily. 180 tablet 1  . [START ON 05/16/2019] lisdexamfetamine (VYVANSE) 70 MG capsule Take 1 capsule (70 mg total) by mouth daily. 30 capsule 0  . meloxicam (MOBIC) 7.5 MG tablet Take 1 tablet (7.5 mg total) by mouth daily for 30 doses. 30 tablet 2  . methocarbamol (ROBAXIN) 500 MG tablet Take 500 mg by mouth 2 (two) times daily as needed.    . metolazone (ZAROXOLYN) 2.5 MG tablet Take 1 tablet (2.5 mg total) by mouth daily. (Patient taking differently: Take 2.5 mg by mouth 3 (three) times a week. ) 90 tablet 0  . montelukast (SINGULAIR) 10 MG tablet Take 10 mg by mouth at bedtime.    . Multiple Vitamin (MULTIVITAMIN WITH MINERALS) TABS tablet Take 1 tablet by mouth daily.    [name] omeprazole (PRILOSEC) 40 MG capsule Take 1 capsule (40 mg total) by mouth 2 (two) times daily. 180 capsule 3  . oxybutynin (DITROPAN-XL) 10 MG 24 hr tablet Take 1-2 tablets (10-20 mg total) by mouth at bedtime. (Patient taking differently: Take 10 mg by mouth 2 (two) times daily. ) 60 tablet 1  . potassium  chloride SA (KLOR-CON) 20 MEQ tablet Take 40 mEq by mouth 2 (two) times daily.    . prazosin (MINIPRESS) 5 MG capsule Take 1 capsule (5 mg total) by mouth 2 (two) times daily. 60 capsule 2  . traZODone (DESYREL) 100 MG tablet Take 1 tablet (100 mg total) by mouth at bedtime. 90 tablet 0  . traZODone (DESYREL) 50 MG tablet Take 1 tablet (50 mg total) by mouth daily. 90 tablet 0   No current facility-administered medications on file prior to visit.    Observations/Objective: NAD. Speaking clearly.  Work of breathing normal.  Alert and oriented. Mood appropriate.   Assessment and Plan: 1. Encounter to establish care  2. Gout, unspecified cause, unspecified chronicity, unspecified site - COLCRYS 0.6 MG tablet; Take 1 tablet (0.6 mg total) by mouth 2 (two) times daily. TAKE 1 TABLET (0.6 MG TOTAL) BY MOUTH 2 (TWO) TIMES DAILY.  Dispense: 60 tablet; Refill: 0  3. Onychomycosis - terbinafine (LAMISIL) 250 MG tablet; Take 1 tablet (250 mg total) by mouth daily.  Dispense: 84 tablet; Refill: 0  4. Arthritis - Diclofenac Sodium 3 % GEL; Apply 1 application topically daily as needed.  Dispense: 100 g; Refill: 0 - diclofenac (VOLTAREN) 75 MG EC tablet; Take 1 tablet (75 mg total) by mouth 2 (two) times daily.  Dispense: 60 tablet; Refill: 1  5. Essential hypertension Reviewing prior BP trend seems that patient could tolerate Amlodipine 10 mg. Does appear that she has been on 10 mg in the past when reviewing prior Rx as well.  - amLODipine (NORVASC) 10 MG tablet; TAKE 1 TABLET (5 MG TOTAL) BY MOUTH EVERY MORNING  Dispense: 90 tablet; Refill: 0  6. Debility Patient requests renewal of [place] aid and RN for debility and chronic medical conditions including but not limited to OSA, CHF, h/o anoxic brain injury related to respiratory arrest, COPD, OA, gout all of which impact her ability to safely leave the home without assistance.  - Home Health - Face-to-face encounter (required for Medicare/Medicaid  patients)  I spent >50 minutes on patient care reviewing chart, medications, PMH,  specialists notes. Over 25 minutes of this time was spent with the patient on the phone.   Follow Up Instructions: For chronic medical conditions    I discussed the assessment and treatment plan with the patient. The patient was provided an opportunity to ask questions and all were answered. The patient agreed with the plan and demonstrated an understanding of the instructions.   The patient was advised to call back or seek an in-person evaluation if the symptoms worsen or if the condition fails to improve as anticipated.   I provided 55 minutes total of non-face-to-face time during this encounter including median intraservice time, reviewing previous notes, investigations, ordering medications, medical decision making, coordinating care and patient verbalized understanding at the end of the visit.    Phill Myron, D.O. Primary [facility] at [facility]  05/14/2019, 2:58 PM

## 2019-05-14 NOTE — Telephone Encounter (Signed)
I called patient and advised. 

## 2019-05-16 ENCOUNTER — Other Ambulatory Visit: Payer: Self-pay

## 2019-05-16 DIAGNOSIS — D649 Anemia, unspecified: Secondary | ICD-10-CM

## 2019-05-16 DIAGNOSIS — E1142 Type 2 diabetes mellitus with diabetic polyneuropathy: Secondary | ICD-10-CM

## 2019-05-19 ENCOUNTER — Other Ambulatory Visit: Payer: Self-pay

## 2019-05-19 ENCOUNTER — Telehealth (INDEPENDENT_AMBULATORY_CARE_PROVIDER_SITE_OTHER): Payer: Medicare Other | Admitting: Psychiatry

## 2019-05-19 DIAGNOSIS — F250 Schizoaffective disorder, bipolar type: Secondary | ICD-10-CM

## 2019-05-19 DIAGNOSIS — F4310 Post-traumatic stress disorder, unspecified: Secondary | ICD-10-CM

## 2019-05-19 MED ORDER — LISDEXAMFETAMINE DIMESYLATE 70 MG PO CAPS
70.0000 mg | ORAL_CAPSULE | Freq: Every day | ORAL | 0 refills | Status: DC
Start: 2019-06-18 — End: 2019-06-13

## 2019-05-19 MED ORDER — PRAZOSIN HCL 5 MG PO CAPS: 5.0000 mg | ORAL_CAPSULE | Freq: Two times a day (BID) | ORAL | 2 refills | Status: DC

## 2019-05-19 MED ORDER — DIAZEPAM 10 MG PO TABS: 10.0000 mg | ORAL_TABLET | Freq: Three times a day (TID) | ORAL | 2 refills | Status: DC

## 2019-05-19 MED ORDER — LISDEXAMFETAMINE DIMESYLATE 70 MG PO CAPS
70.0000 mg | ORAL_CAPSULE | Freq: Every day | ORAL | 0 refills | Status: DC
Start: 2019-07-18 — End: 2019-06-13

## 2019-05-19 MED ORDER — LISDEXAMFETAMINE DIMESYLATE 70 MG PO CAPS
70.0000 mg | ORAL_CAPSULE | Freq: Every day | ORAL | 0 refills | Status: DC
Start: 2019-05-19 — End: 2019-06-13

## 2019-05-19 MED ORDER — ESCITALOPRAM OXALATE 20 MG PO TABS: 40.0000 mg | ORAL_TABLET | Freq: Every day | ORAL | 0 refills | Status: DC

## 2019-05-19 MED ORDER — ASENAPINE MALEATE 10 MG SL SUBL: 10.0000 mg | SUBLINGUAL_TABLET | Freq: Three times a day (TID) | SUBLINGUAL | 2 refills | Status: DC

## 2019-05-19 MED ORDER — TRAZODONE HCL 100 MG PO TABS: 100.0000 mg | ORAL_TABLET | Freq: Every day | ORAL | 0 refills | Status: DC

## 2019-05-19 MED ORDER — TRAZODONE HCL 50 MG PO TABS: 50.0000 mg | ORAL_TABLET | Freq: Every day | ORAL | 0 refills | Status: DC
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
## 2019-05-20 DIAGNOSIS — J449 Chronic obstructive pulmonary disease, unspecified: Secondary | ICD-10-CM | POA: Diagnosis not present

## 2019-05-22 DIAGNOSIS — J45909 Unspecified asthma, uncomplicated: Secondary | ICD-10-CM | POA: Diagnosis not present

## 2019-05-22 DIAGNOSIS — J449 Chronic obstructive pulmonary disease, unspecified: Secondary | ICD-10-CM | POA: Diagnosis not present

## 2019-05-22 DIAGNOSIS — R062 Wheezing: Secondary | ICD-10-CM | POA: Diagnosis not present

## 2019-05-22 DIAGNOSIS — M6281 Muscle weakness (generalized): Secondary | ICD-10-CM | POA: Diagnosis not present

## 2019-05-22 IMAGING — RF DG CHOLANGIOGRAM VIA EXIST CATHETER
3 series · 7 of 7 positions shown · non-contrast
Comparison: none

INDICATION: Cholecystostomy tube placed 4 weeks ago

[Series 1: one shot · 2 of 2 slices shown (1 of 2)]
[im 1/2]
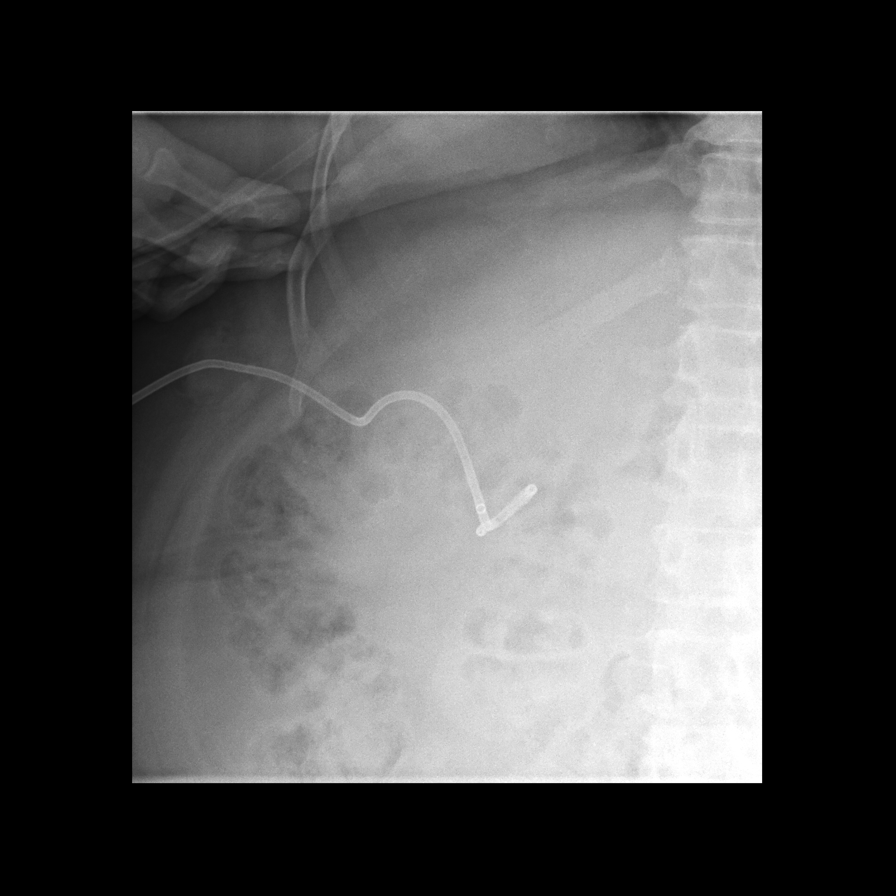
[im 2/2]
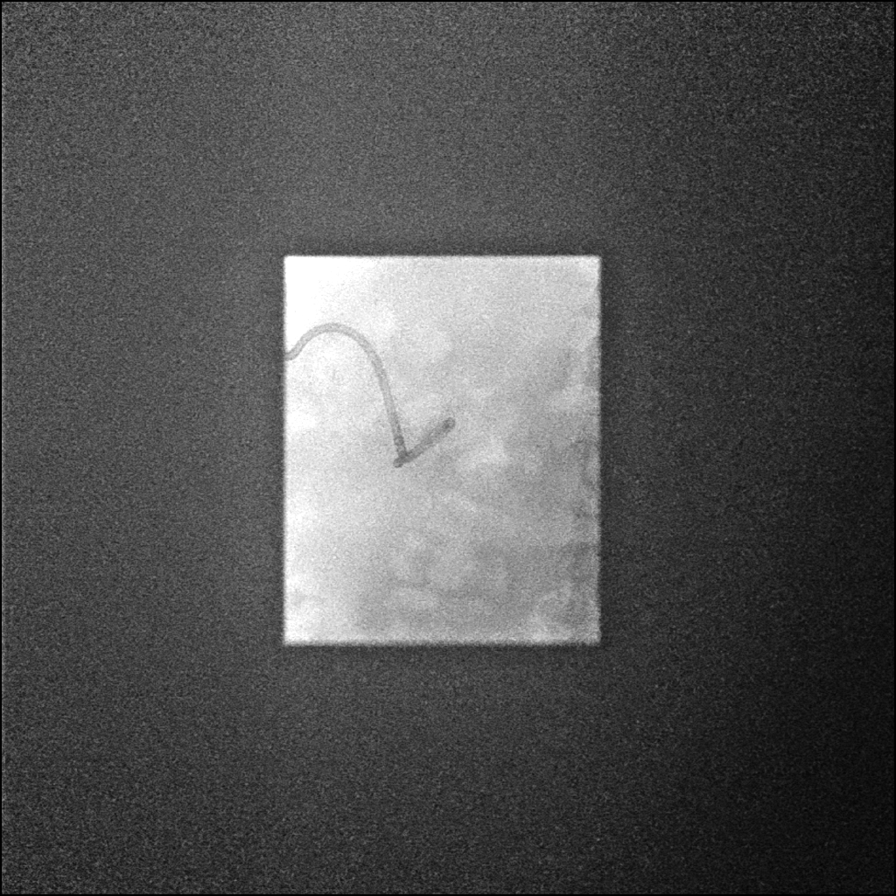

[Series 2: sequence · 4 of 14 frames shown]
[frame 2/14]
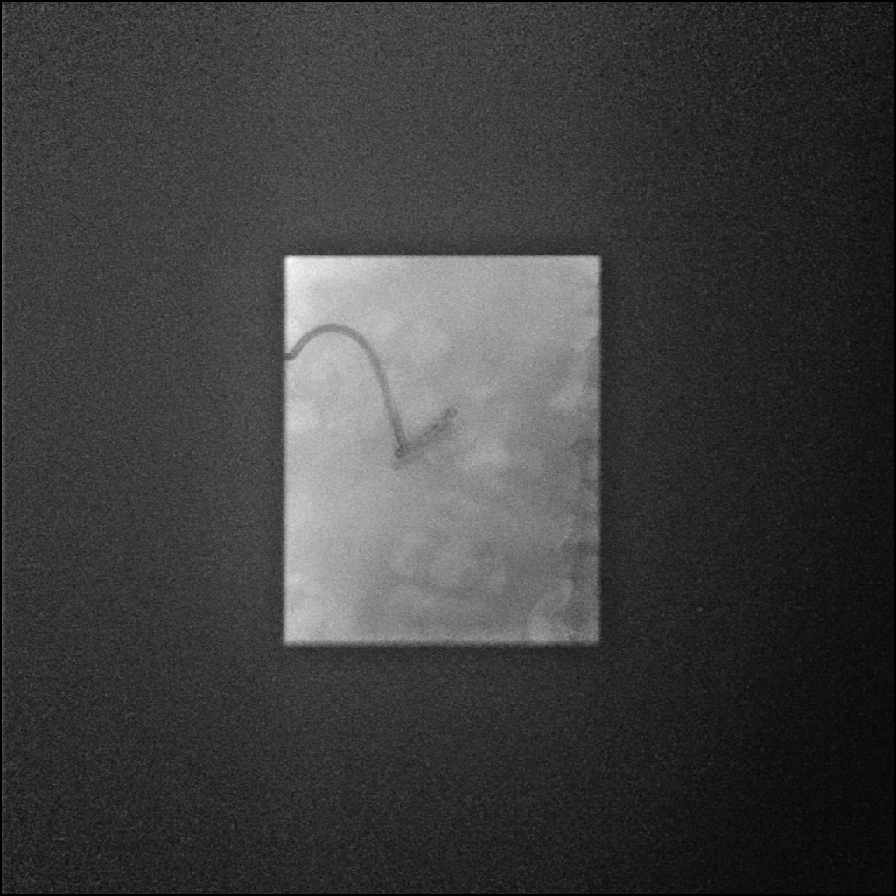
[frame 3/14]
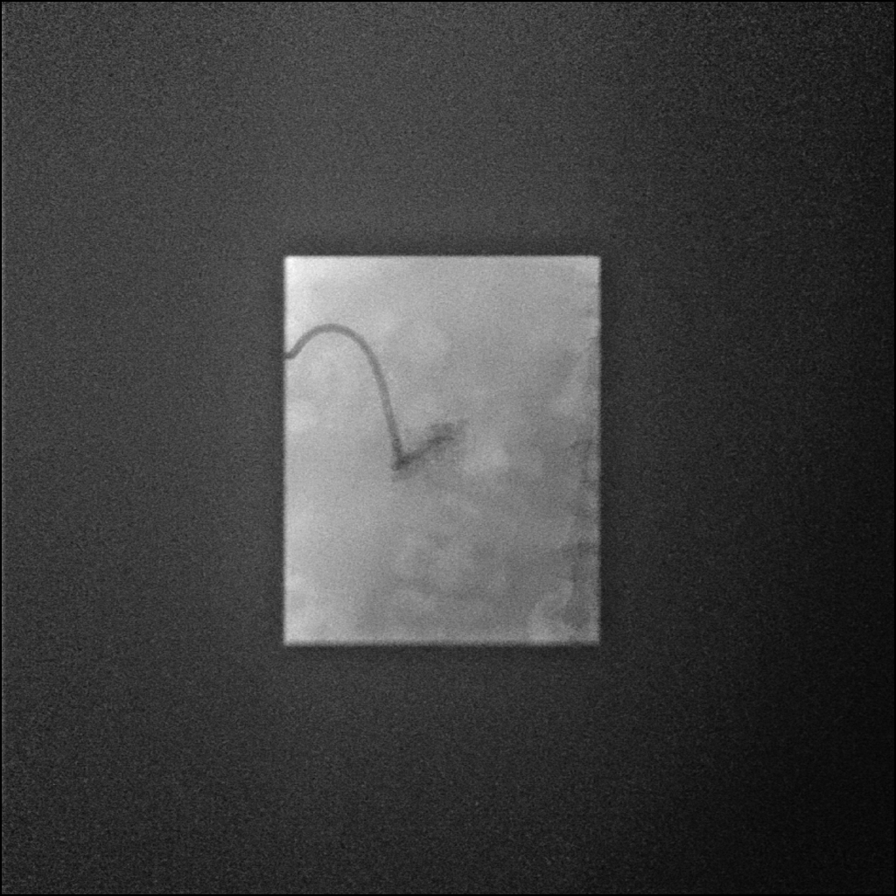
[frame 8/14]
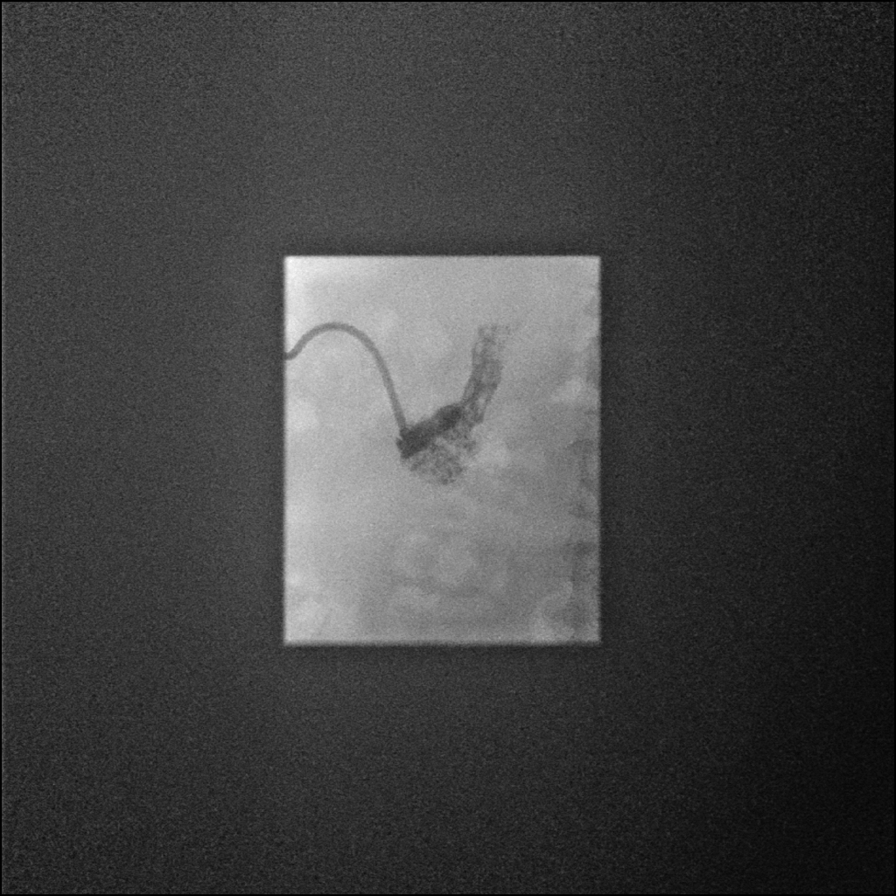
[frame 12/14]
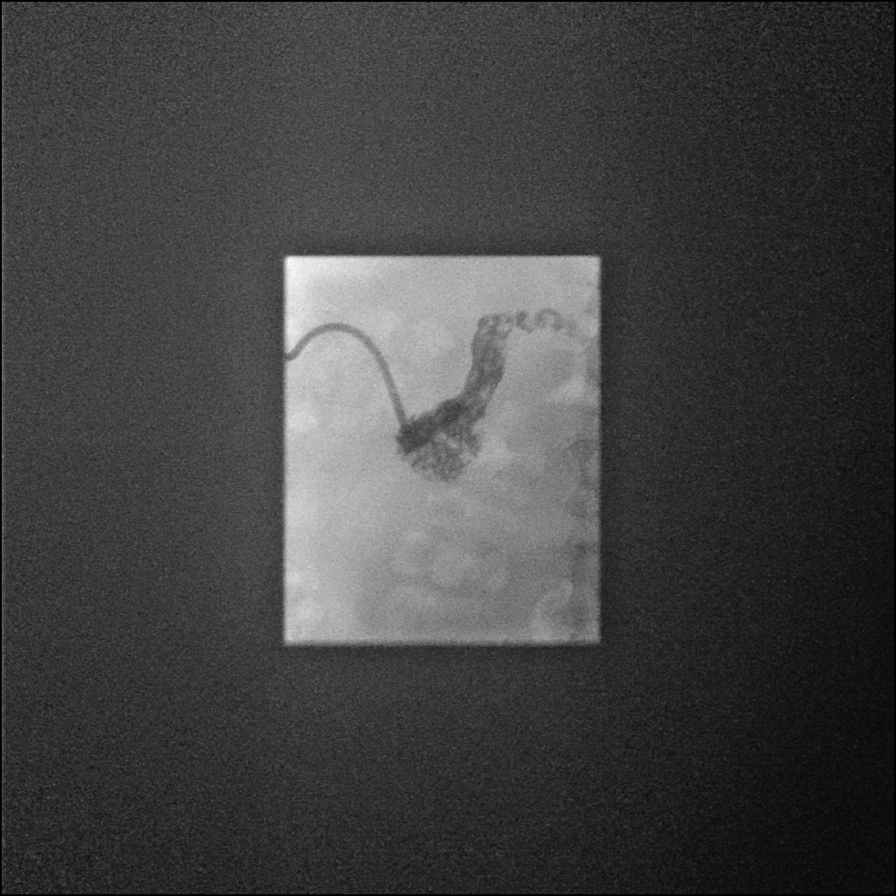

[Series 3: one shot · 1 of 1 slices shown (2 of 2)]
[im 1/1]
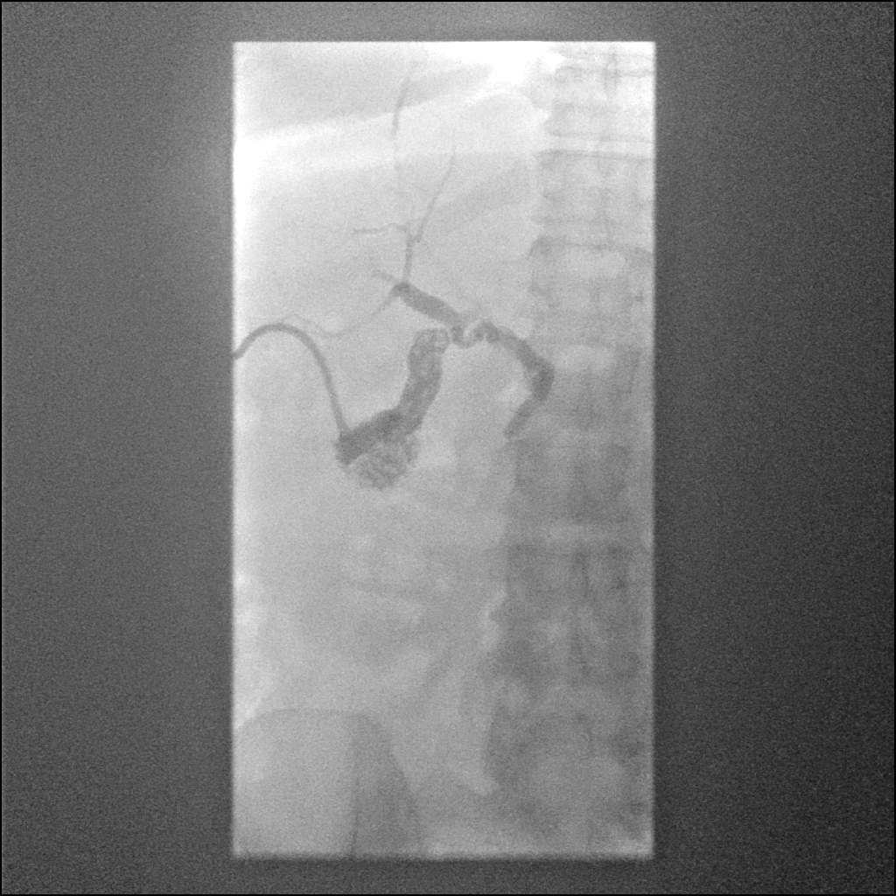

[7 of 7 positions shown; findings below may reference images not displayed]

EXAM:
CHOLANGIOGRAM

MEDICATIONS:
None

ANESTHESIA/SEDATION:
None

FLUOROSCOPY TIME:  Fluoroscopy Time:  minutes 20 seconds ( mGy).

COMPLICATIONS:
None immediate.

PROCEDURE:
Informed written consent was obtained from the patient after a
thorough discussion of the procedural risks, benefits and
alternatives. All questions were addressed. Maximal Sterile Barrier
Technique was utilized including caps, mask, sterile gowns, sterile
gloves, sterile drape, hand hygiene and skin antiseptic. A timeout
was performed prior to the initiation of the procedure.

Contrast was injected into the cholecystostomy tube and imaging was
obtained.
FINDINGS: The tube is stable in position and coiled in the lumen of the
gallbladder. Several filling defects in the gallbladder are noted
compatible with gallstones. Contrast fills the biliary tree
compatible with patency of the cystic duct.
IMPRESSION: The drain is stable in position and the cystic duct is patent.
Gallstones are noted.

## 2019-05-24 DIAGNOSIS — J449 Chronic obstructive pulmonary disease, unspecified: Secondary | ICD-10-CM | POA: Diagnosis not present

## 2019-05-25 DIAGNOSIS — I959 Hypotension, unspecified: Secondary | ICD-10-CM | POA: Diagnosis not present

## 2019-05-25 DIAGNOSIS — R001 Bradycardia, unspecified: Secondary | ICD-10-CM | POA: Diagnosis not present

## 2019-05-25 DIAGNOSIS — E785 Hyperlipidemia, unspecified: Secondary | ICD-10-CM | POA: Diagnosis not present

## 2019-05-25 DIAGNOSIS — M7989 Other specified soft tissue disorders: Secondary | ICD-10-CM | POA: Diagnosis not present

## 2019-05-25 DIAGNOSIS — D500 Iron deficiency anemia secondary to blood loss (chronic): Secondary | ICD-10-CM | POA: Diagnosis not present

## 2019-05-25 DIAGNOSIS — Z791 Long term (current) use of non-steroidal anti-inflammatories (NSAID): Secondary | ICD-10-CM | POA: Diagnosis not present

## 2019-05-25 DIAGNOSIS — K2951 Unspecified chronic gastritis with bleeding: Secondary | ICD-10-CM | POA: Diagnosis not present

## 2019-05-25 DIAGNOSIS — Z7951 Long term (current) use of inhaled steroids: Secondary | ICD-10-CM | POA: Diagnosis not present

## 2019-05-25 DIAGNOSIS — E876 Hypokalemia: Secondary | ICD-10-CM | POA: Diagnosis not present

## 2019-05-25 DIAGNOSIS — I252 Old myocardial infarction: Secondary | ICD-10-CM | POA: Diagnosis not present

## 2019-05-25 DIAGNOSIS — K6289 Other specified diseases of anus and rectum: Secondary | ICD-10-CM | POA: Diagnosis not present

## 2019-05-25 DIAGNOSIS — J45909 Unspecified asthma, uncomplicated: Secondary | ICD-10-CM | POA: Diagnosis not present

## 2019-05-25 DIAGNOSIS — K5791 Diverticulosis of intestine, part unspecified, without perforation or abscess with bleeding: Secondary | ICD-10-CM | POA: Diagnosis not present

## 2019-05-25 DIAGNOSIS — K5730 Diverticulosis of large intestine without perforation or abscess without bleeding: Secondary | ICD-10-CM | POA: Diagnosis not present

## 2019-05-25 DIAGNOSIS — G9341 Metabolic encephalopathy: Secondary | ICD-10-CM | POA: Diagnosis not present

## 2019-05-25 DIAGNOSIS — D62 Acute posthemorrhagic anemia: Secondary | ICD-10-CM | POA: Diagnosis not present

## 2019-05-25 DIAGNOSIS — I088 Other rheumatic multiple valve diseases: Secondary | ICD-10-CM | POA: Diagnosis not present

## 2019-05-25 DIAGNOSIS — R062 Wheezing: Secondary | ICD-10-CM | POA: Diagnosis not present

## 2019-05-25 DIAGNOSIS — Z20822 Contact with and (suspected) exposure to covid-19: Secondary | ICD-10-CM | POA: Diagnosis not present

## 2019-05-25 DIAGNOSIS — R9431 Abnormal electrocardiogram [ECG] [EKG]: Secondary | ICD-10-CM | POA: Diagnosis not present

## 2019-05-25 DIAGNOSIS — R609 Edema, unspecified: Secondary | ICD-10-CM | POA: Diagnosis not present

## 2019-05-25 DIAGNOSIS — N3000 Acute cystitis without hematuria: Secondary | ICD-10-CM | POA: Diagnosis not present

## 2019-05-25 DIAGNOSIS — E878 Other disorders of electrolyte and fluid balance, not elsewhere classified: Secondary | ICD-10-CM | POA: Diagnosis not present

## 2019-05-25 DIAGNOSIS — R531 Weakness: Secondary | ICD-10-CM | POA: Diagnosis not present

## 2019-05-25 DIAGNOSIS — G8929 Other chronic pain: Secondary | ICD-10-CM | POA: Diagnosis not present

## 2019-05-25 DIAGNOSIS — E871 Hypo-osmolality and hyponatremia: Secondary | ICD-10-CM | POA: Diagnosis not present

## 2019-05-25 DIAGNOSIS — K2100 Gastro-esophageal reflux disease with esophagitis, without bleeding: Secondary | ICD-10-CM | POA: Diagnosis not present

## 2019-05-25 DIAGNOSIS — N179 Acute kidney failure, unspecified: Secondary | ICD-10-CM | POA: Diagnosis not present

## 2019-05-25 DIAGNOSIS — R109 Unspecified abdominal pain: Secondary | ICD-10-CM | POA: Diagnosis not present

## 2019-05-25 DIAGNOSIS — K922 Gastrointestinal hemorrhage, unspecified: Secondary | ICD-10-CM | POA: Diagnosis not present

## 2019-05-25 DIAGNOSIS — K91840 Postprocedural hemorrhage and hematoma of a digestive system organ or structure following a digestive system procedure: Secondary | ICD-10-CM | POA: Diagnosis not present

## 2019-05-25 DIAGNOSIS — K2950 Unspecified chronic gastritis without bleeding: Secondary | ICD-10-CM | POA: Diagnosis not present

## 2019-05-25 DIAGNOSIS — J449 Chronic obstructive pulmonary disease, unspecified: Secondary | ICD-10-CM | POA: Diagnosis not present

## 2019-05-25 DIAGNOSIS — K5731 Diverticulosis of large intestine without perforation or abscess with bleeding: Secondary | ICD-10-CM | POA: Diagnosis not present

## 2019-05-25 DIAGNOSIS — G4700 Insomnia, unspecified: Secondary | ICD-10-CM | POA: Diagnosis not present

## 2019-05-25 DIAGNOSIS — K921 Melena: Secondary | ICD-10-CM | POA: Diagnosis not present

## 2019-05-25 DIAGNOSIS — G4733 Obstructive sleep apnea (adult) (pediatric): Secondary | ICD-10-CM | POA: Diagnosis not present

## 2019-05-25 DIAGNOSIS — R52 Pain, unspecified: Secondary | ICD-10-CM | POA: Diagnosis not present

## 2019-05-25 DIAGNOSIS — I509 Heart failure, unspecified: Secondary | ICD-10-CM | POA: Diagnosis not present

## 2019-05-25 DIAGNOSIS — I110 Hypertensive heart disease with heart failure: Secondary | ICD-10-CM | POA: Diagnosis not present

## 2019-05-25 DIAGNOSIS — D649 Anemia, unspecified: Secondary | ICD-10-CM | POA: Diagnosis not present

## 2019-05-26 DIAGNOSIS — R001 Bradycardia, unspecified: Secondary | ICD-10-CM | POA: Diagnosis not present

## 2019-05-26 DIAGNOSIS — I088 Other rheumatic multiple valve diseases: Secondary | ICD-10-CM | POA: Diagnosis not present

## 2019-05-26 DIAGNOSIS — I509 Heart failure, unspecified: Secondary | ICD-10-CM | POA: Diagnosis not present

## 2019-05-26 IMAGING — CT CT HEAD W/O CM
4 series · 15 of 47 positions shown, 17 images · non-contrast
Comparison: Head CT 01/04/2013.

CLINICAL DATA: 56-year-old female with history of weakness since
yesterday.

EXAM:
CT HEAD WITHOUT CONTRAST
TECHNIQUE: Contiguous axial images were obtained from the base of the skull
through the vertex without intravenous contrast.

[Series 3: head without · axial · non-contrast · 0.47mm/px · z∈[-47,+73]mm · 7 of 32 slices shown, 9 images]
[im 4/32  brain]
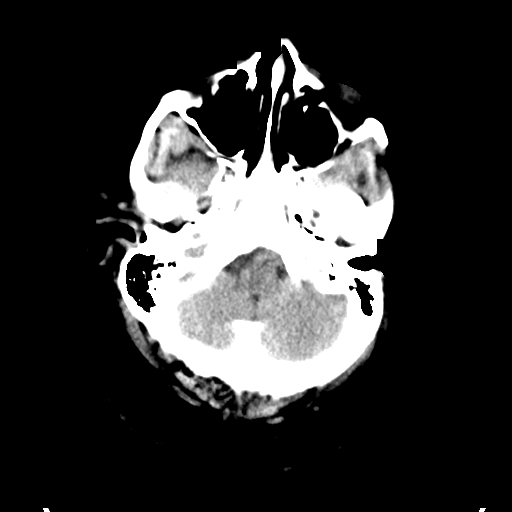
[im 4/32  bone]
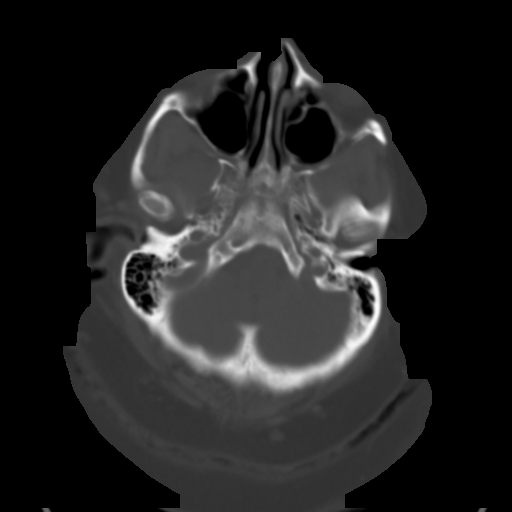
[im 8/32  brain]
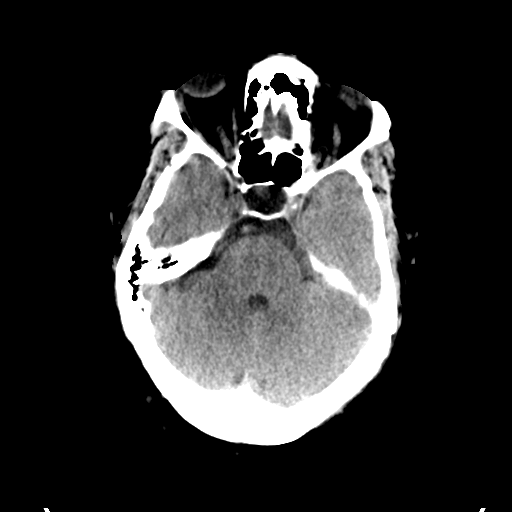
[im 12/32  brain]
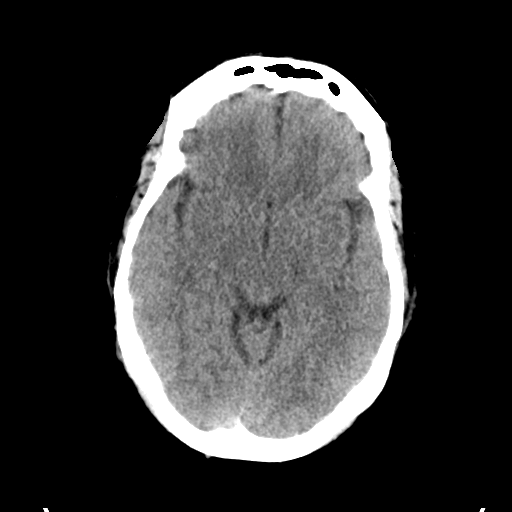
[im 16/32  brain]
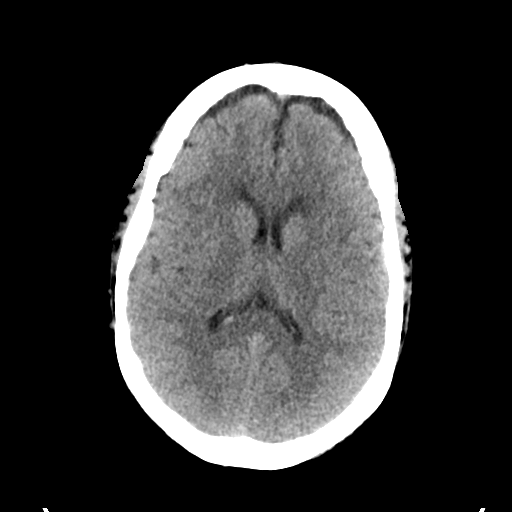
[im 20/32  brain]
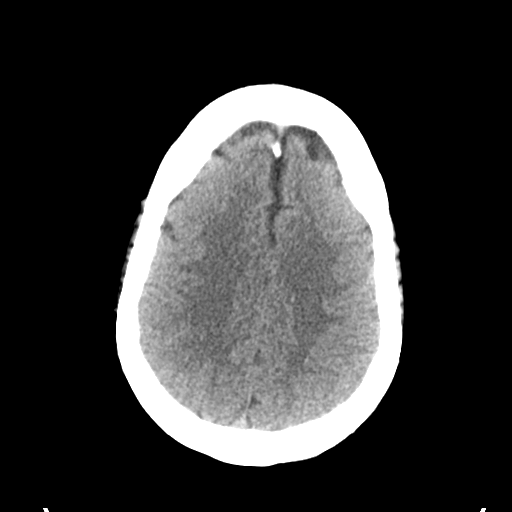
[im 20/32  bone]
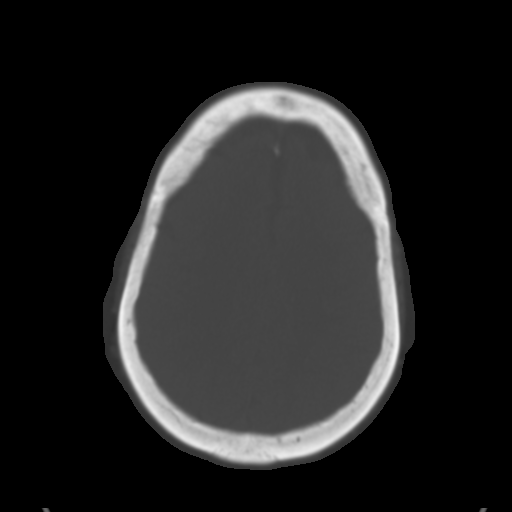
[im 24/32  brain]
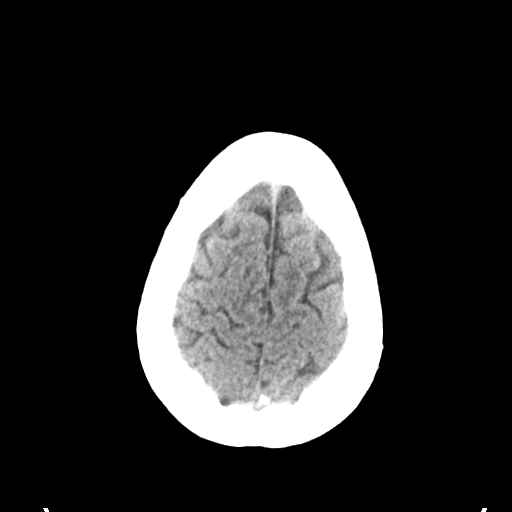
[im 28/32  brain]
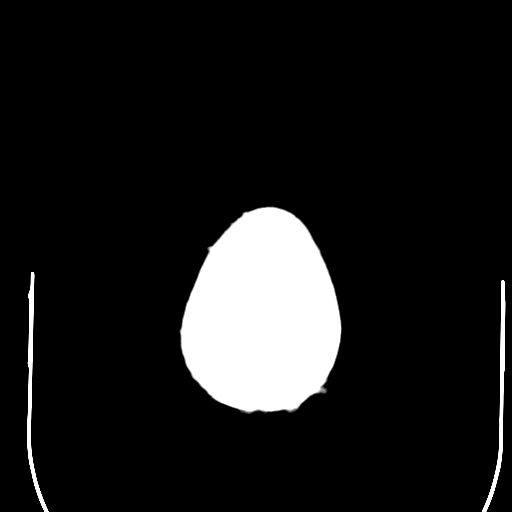

[Series 4: head bone · axial · 0.47mm/px · z∈[-48,-32]mm · 2 of 79 slices shown]
[im 8/79  bone]
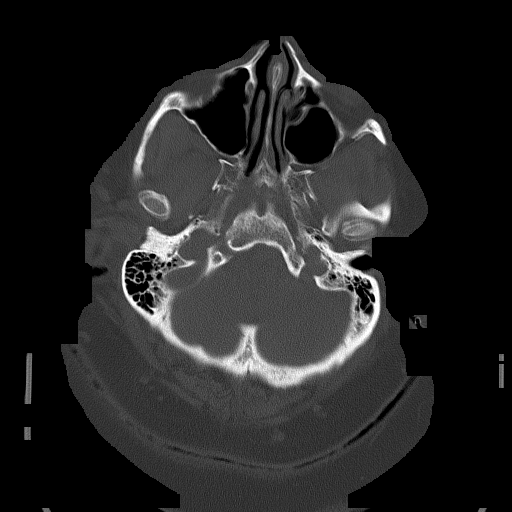
[im 16/79  bone]
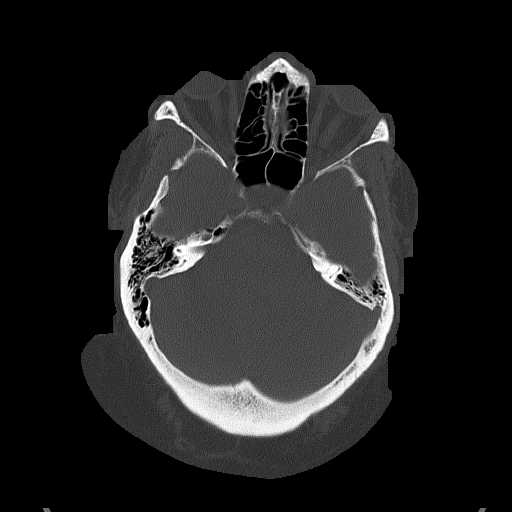

[Series 5: head without cor · coronal · non-contrast · 0.32mm/px · 3 of 67 slices shown]
[im 23/67  brain]
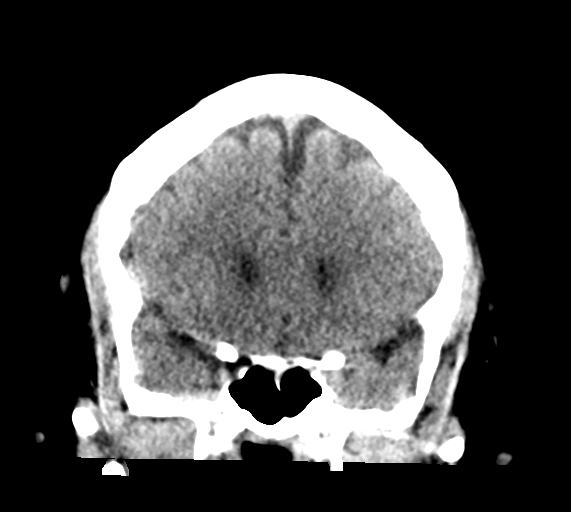
[im 30/67  brain]
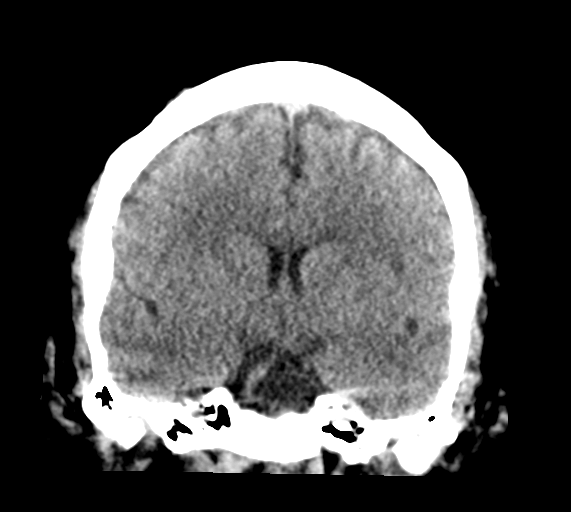
[im 37/67  brain]
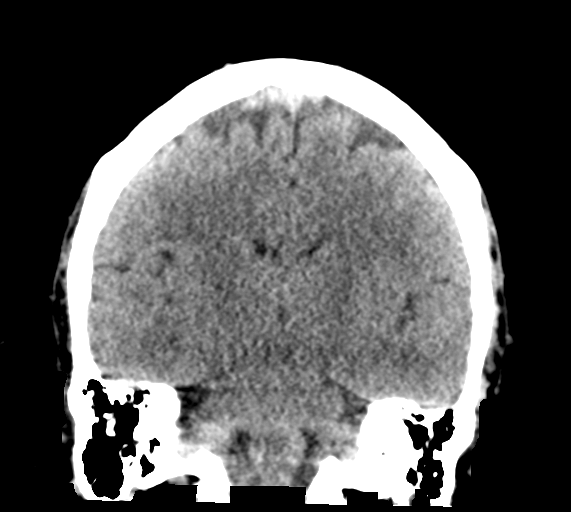

[Series 6: head without sag · sagittal · non-contrast · 0.31mm/px · 3 of 67 slices shown]
[im 23/67  brain]
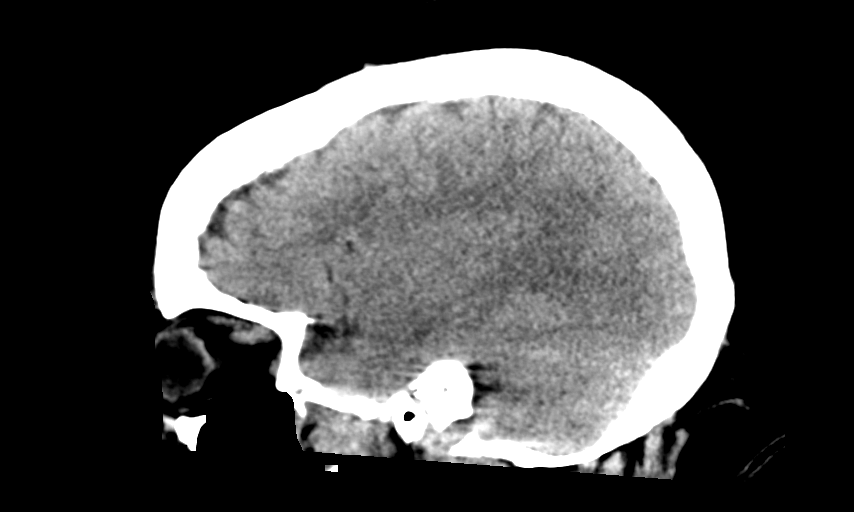
[im 34/67  brain]
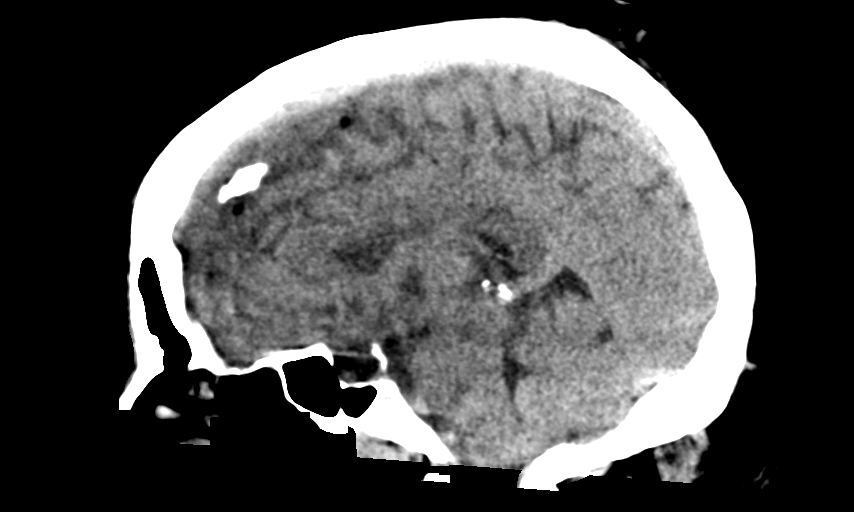
[im 45/67  brain]
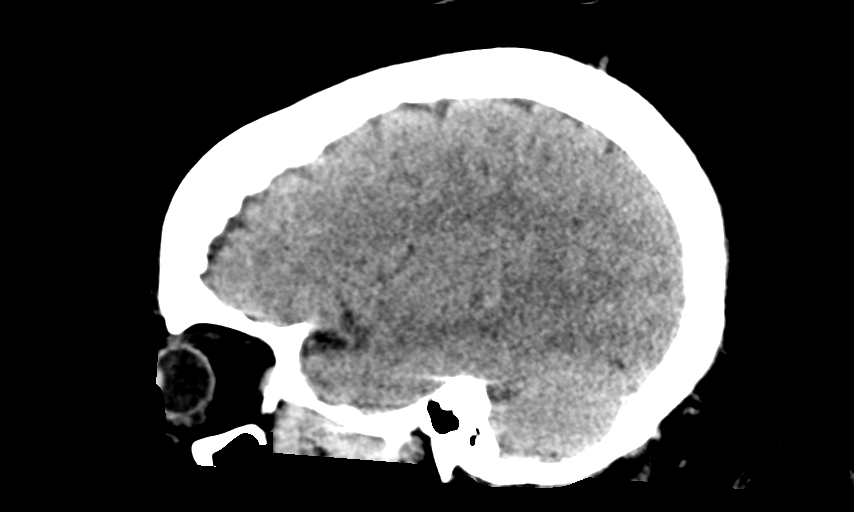

[15 of 47 positions shown; findings below may reference images not displayed]

FINDINGS: Brain: Patchy areas of decreased attenuation are noted throughout
the deep and periventricular white matter of the cerebral
hemispheres bilaterally, compatible with mild chronic microvascular
ischemic disease. No evidence of acute infarction, hemorrhage,
hydrocephalus, extra-axial collection or mass lesion/mass effect.

Vascular: No hyperdense vessel or unexpected calcification.

Skull: Normal. Negative for fracture or focal lesion.

Sinuses/Orbits: No acute finding.

Other: None.
IMPRESSION: 1. No acute intracranial abnormalities.
2. Mild chronic microvascular ischemic changes in the cerebral white
matter redemonstrated.

## 2019-05-26 MED ORDER — POTASSIUM CHLORIDE CRYS ER 20 MEQ PO TBCR: 40.0000 meq | EXTENDED_RELEASE_TABLET | Freq: Two times a day (BID) | ORAL | 1 refills | Status: DC

## 2019-05-26 MED ORDER — FERROUS SULFATE 325 (65 FE) MG PO TABS: 325.0000 mg | ORAL_TABLET | Freq: Every day | ORAL | 1 refills | Status: DC

## 2019-05-26 MED ORDER — METOLAZONE 2.5 MG PO TABS: 2.5000 mg | ORAL_TABLET | Freq: Every day | ORAL | 1 refills | Status: DC

## 2019-05-26 MED ORDER — GABAPENTIN 600 MG PO TABS: 600.0000 mg | ORAL_TABLET | Freq: Two times a day (BID) | ORAL | 1 refills | Status: DC

## 2019-06-02 ENCOUNTER — Other Ambulatory Visit: Payer: Self-pay | Admitting: Registered Nurse

## 2019-06-04 ENCOUNTER — Telehealth: Payer: Self-pay | Admitting: Internal Medicine

## 2019-06-04 ENCOUNTER — Other Ambulatory Visit: Payer: Self-pay | Admitting: Internal Medicine

## 2019-06-04 DIAGNOSIS — D649 Anemia, unspecified: Secondary | ICD-10-CM

## 2019-06-04 MED ORDER — OMEPRAZOLE 40 MG PO CPDR: 40.0000 mg | DELAYED_RELEASE_CAPSULE | Freq: Two times a day (BID) | ORAL | 3 refills | Status: DC

## 2019-06-04 NOTE — Telephone Encounter (Signed)
Rx sent.   Phill Myron, D.O. Primary Care at [facility]  06/04/2019, 2:05 PM

## 2019-06-04 NOTE — Telephone Encounter (Signed)
1) Medication(s) Requested (by name):omeprazole (PRILOSEC) 40 MG capsule [[id number] febuxostat (ULORIC) 40 MG tablet [[id number]]     2) [id number] [place], [address] [place] [address]  3) Special Requests:   Approved medications will be sent to the pharmacy, we will reach out if there is an issue.  Requests made after 3pm may not be addressed until the following business day!  If a patient is unsure of the name of the medication(s) please note and ask patient to call back when they are able to provide all info, do not send to responsible party until all information is available!

## 2019-06-05 NOTE — Telephone Encounter (Signed)
1) Medication(s) Requested (by name):febuxostat (ULORIC) 40 MG tablet [[id number]  2) [facility]   3) Special Requests:90 day supply    Approved medications will be sent to the pharmacy, we will reach out if there is an issue.  Requests made after 3pm may not be addressed until the following business day!  If a patient is unsure of the name of the medication(s) please note and ask patient to call back when they are able to provide all info, do not send to responsible party until all information is available!

## 2019-06-11 ENCOUNTER — Telehealth (HOSPITAL_COMMUNITY): Payer: Self-pay

## 2019-06-11 NOTE — Telephone Encounter (Signed)
[name] not due for stimulants.

## 2019-06-11 NOTE — Telephone Encounter (Signed)
This is Dr. Audria Nine patient. Patient called regarding her Vyvanse 70mg . She's in [place], [place] right now and needs [facility] [place] [facility] pharmacy [facility] at. The pharmacy that her Vyvanse 70mg  needs to be E-scribed to for her June refill is [facility] on [address] in [place], [place]. She requested that just the June refill be done and to have the July refill stay as it is for now. Please review and advise. Thank you

## 2019-06-12 ENCOUNTER — Telehealth (HOSPITAL_COMMUNITY): Payer: Self-pay

## 2019-06-12 IMAGING — RF DG CHOLANGIOGRAM VIA EXIST CATHETER
1 series · 4 of 4 positions shown · non-contrast
Comparison: none

INDICATION: 56-year-old female with percutaneous cholecystostomy performed
05/10/2016.

[Series 1: one shot · 4 of 4 slices shown]
[im 1/4]
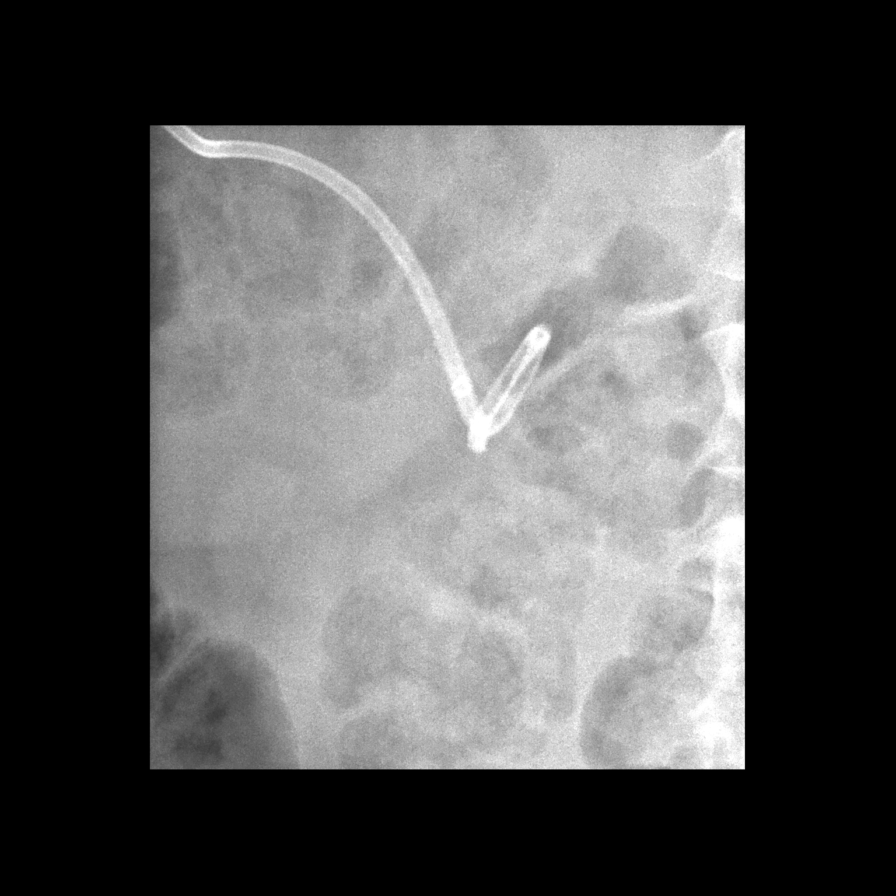
[im 2/4]
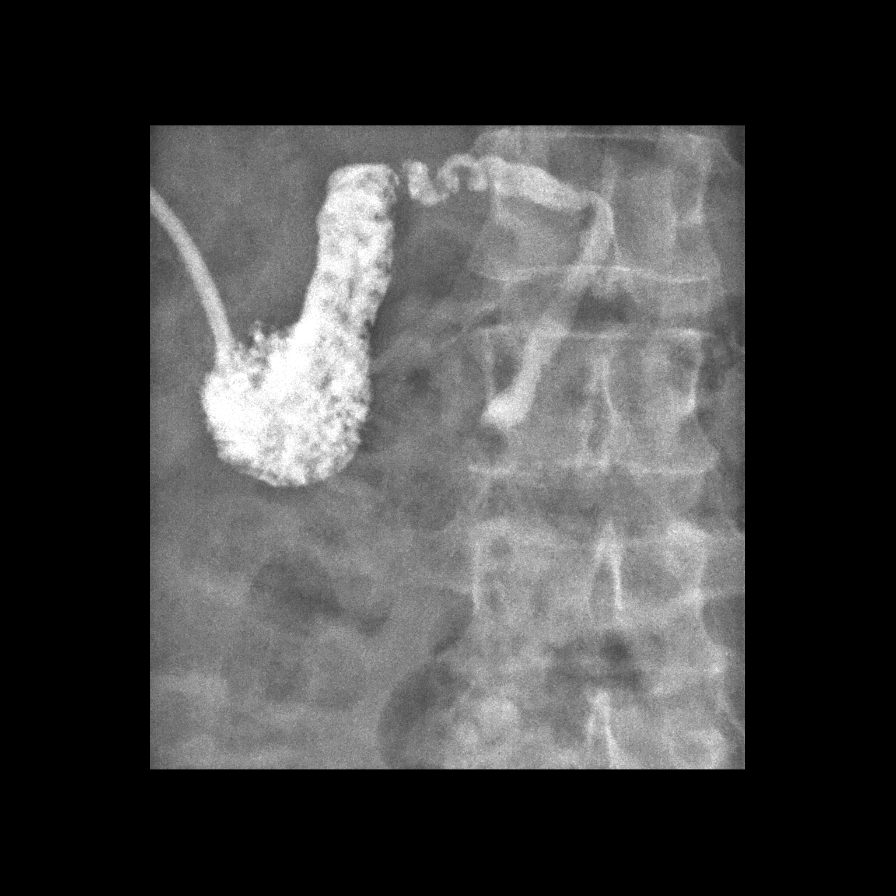
[im 3/4]
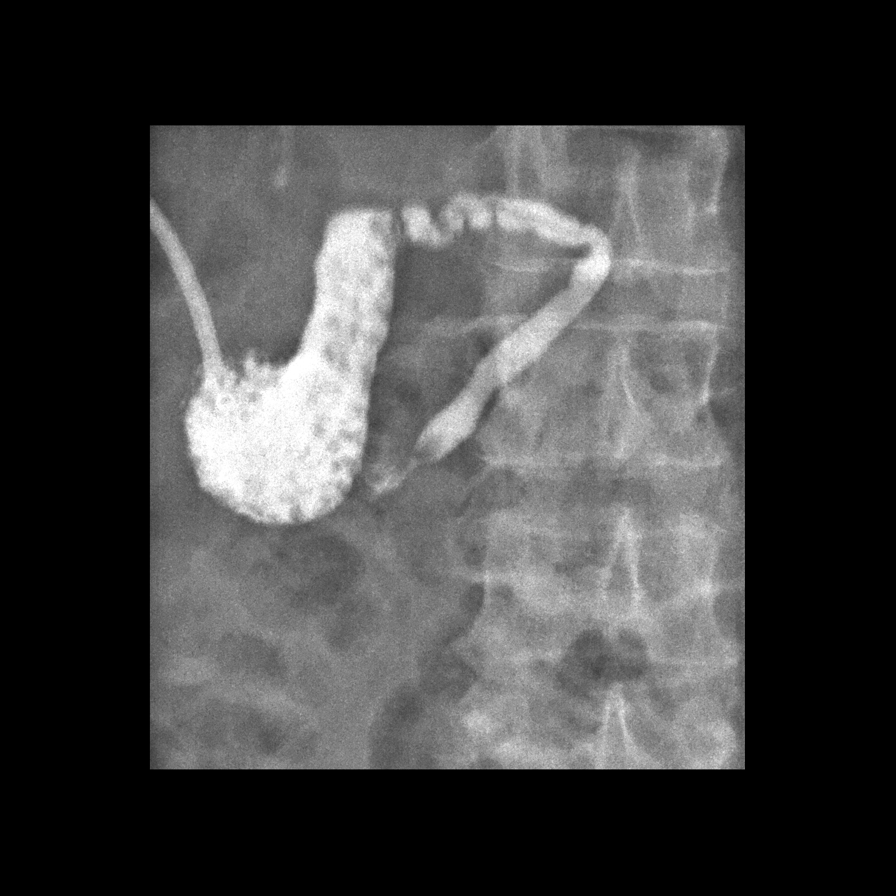
[im 4/4]
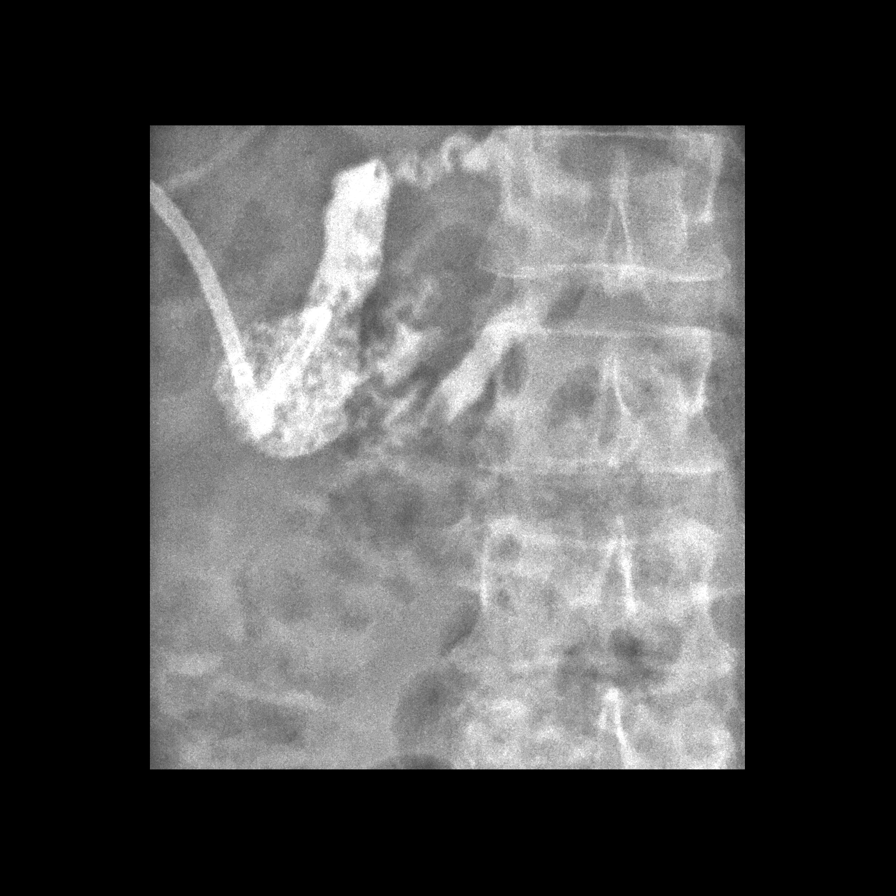

[4 of 4 positions shown; findings below may reference images not displayed]

Patient was treated for acute cholecystitis with percutaneous
cholecystostomy 05/10/2016.

EXAM:
FLUOROSCOPY GUIDED INJECTION OF CHOLECYSTOSTOMY

MEDICATIONS:
None.

ANESTHESIA/SEDATION:
None

COMPLICATIONS:
None

PROCEDURE:
Patient positioned supine position on the fluoroscopy table. Scout
images of the upper abdomen were acquired.

Gentle injection of the cholecystostomy tube was performed
opacifying the gallbladder, cystic duct and the common bile duct.

Delayed image demonstrates contrast entering the duodenum with a
vague filling defect at the ampulla.
IMPRESSION: Status post cholecystostomy tube injection demonstrates multiple
biliary stones with patent cystic duct. There is at least partial
patency of the common bile duct has contrast traverses the ampulla,
however, given the delayed passage of contrast through the ampulla
and the filling defect at the ampulla, there is concern for
partially obstructing common bile duct stone. This may be better
evaluated with either MRCP or ERCP.

PLAN:
After discussion with the patient an explanation of her
approximately 30-40% risk of recurrent acute cholecystitis if the
tube were removed, she would prefer not to go through that
experience with repeat inflammation and the chance having to replace
the tube. She would prefer to leave the tube for now. She also
understands that she is not a good surgical candidate for interval
cholecystectomy. As it were, she likely is a poor candidate for
anesthesia for an ERCP.

We will plan on scheduling a routine cholecystostomy tube exchange
in 3-4 months. She agrees with the plan.

## 2019-06-12 NOTE — Telephone Encounter (Signed)
This is Dr. Audria Nine patient. Patient called again requesting 1 refill on her Vyvanse 70mg  and her Diazepam 10mg . She's in [place] right now so would like them sent to [facility] on [address] in [place], [place] if doctor is comfortable calling them in. Please review and advise. Thank you.

## 2019-06-13 MED ORDER — LISDEXAMFETAMINE DIMESYLATE 70 MG PO CAPS: 70.0000 mg | ORAL_CAPSULE | Freq: Every day | ORAL | 0 refills | Status: DC

## 2019-06-13 NOTE — Telephone Encounter (Signed)
Done

## 2019-06-18 ENCOUNTER — Telehealth (HOSPITAL_COMMUNITY): Payer: Self-pay

## 2019-06-18 MED ORDER — DIAZEPAM 10 MG PO TABS: 10.0000 mg | ORAL_TABLET | Freq: Three times a day (TID) | ORAL | 0 refills | Status: DC

## 2019-06-18 NOTE — Telephone Encounter (Signed)
This is Dr. Audria Nine patient. She stated that she's completely out of her Diazepam 10mg . She takes 1 tab TID and requested it be sent to [facility] on [address] in [place], [place]. Thank you.

## 2019-06-18 NOTE — Telephone Encounter (Signed)
Done

## 2019-06-20 DIAGNOSIS — R1084 Generalized abdominal pain: Secondary | ICD-10-CM | POA: Diagnosis not present

## 2019-06-20 DIAGNOSIS — K625 Hemorrhage of anus and rectum: Secondary | ICD-10-CM | POA: Diagnosis not present

## 2019-06-20 DIAGNOSIS — I10 Essential (primary) hypertension: Secondary | ICD-10-CM | POA: Diagnosis not present

## 2019-06-20 DIAGNOSIS — E871 Hypo-osmolality and hyponatremia: Secondary | ICD-10-CM | POA: Diagnosis not present

## 2019-06-20 DIAGNOSIS — K5730 Diverticulosis of large intestine without perforation or abscess without bleeding: Secondary | ICD-10-CM | POA: Diagnosis not present

## 2019-06-20 DIAGNOSIS — Z79899 Other long term (current) drug therapy: Secondary | ICD-10-CM | POA: Diagnosis not present

## 2019-06-20 DIAGNOSIS — E872 Acidosis: Secondary | ICD-10-CM | POA: Diagnosis not present

## 2019-06-20 DIAGNOSIS — Z87891 Personal history of nicotine dependence: Secondary | ICD-10-CM | POA: Diagnosis not present

## 2019-06-20 DIAGNOSIS — K921 Melena: Secondary | ICD-10-CM | POA: Diagnosis not present

## 2019-06-20 DIAGNOSIS — K8020 Calculus of gallbladder without cholecystitis without obstruction: Secondary | ICD-10-CM | POA: Diagnosis not present

## 2019-06-20 DIAGNOSIS — G4733 Obstructive sleep apnea (adult) (pediatric): Secondary | ICD-10-CM | POA: Diagnosis not present

## 2019-06-20 DIAGNOSIS — R2689 Other abnormalities of gait and mobility: Secondary | ICD-10-CM | POA: Diagnosis not present

## 2019-06-20 DIAGNOSIS — M1990 Unspecified osteoarthritis, unspecified site: Secondary | ICD-10-CM | POA: Diagnosis not present

## 2019-06-20 DIAGNOSIS — M109 Gout, unspecified: Secondary | ICD-10-CM | POA: Diagnosis not present

## 2019-06-20 DIAGNOSIS — R112 Nausea with vomiting, unspecified: Secondary | ICD-10-CM | POA: Diagnosis not present

## 2019-06-20 DIAGNOSIS — E785 Hyperlipidemia, unspecified: Secondary | ICD-10-CM | POA: Diagnosis not present

## 2019-06-20 DIAGNOSIS — J449 Chronic obstructive pulmonary disease, unspecified: Secondary | ICD-10-CM | POA: Diagnosis not present

## 2019-06-20 DIAGNOSIS — N179 Acute kidney failure, unspecified: Secondary | ICD-10-CM | POA: Diagnosis not present

## 2019-06-20 NOTE — Telephone Encounter (Signed)
Notified patient.

## 2019-06-21 DIAGNOSIS — G4733 Obstructive sleep apnea (adult) (pediatric): Secondary | ICD-10-CM | POA: Diagnosis not present

## 2019-06-21 DIAGNOSIS — R1084 Generalized abdominal pain: Secondary | ICD-10-CM | POA: Diagnosis not present

## 2019-06-21 DIAGNOSIS — K8020 Calculus of gallbladder without cholecystitis without obstruction: Secondary | ICD-10-CM | POA: Diagnosis not present

## 2019-06-21 DIAGNOSIS — M109 Gout, unspecified: Secondary | ICD-10-CM | POA: Diagnosis not present

## 2019-06-21 DIAGNOSIS — I10 Essential (primary) hypertension: Secondary | ICD-10-CM | POA: Diagnosis not present

## 2019-06-21 DIAGNOSIS — K5730 Diverticulosis of large intestine without perforation or abscess without bleeding: Secondary | ICD-10-CM | POA: Diagnosis not present

## 2019-06-21 DIAGNOSIS — Z87891 Personal history of nicotine dependence: Secondary | ICD-10-CM | POA: Diagnosis not present

## 2019-06-21 DIAGNOSIS — N179 Acute kidney failure, unspecified: Secondary | ICD-10-CM | POA: Diagnosis not present

## 2019-06-21 DIAGNOSIS — R001 Bradycardia, unspecified: Secondary | ICD-10-CM | POA: Diagnosis not present

## 2019-06-21 DIAGNOSIS — E785 Hyperlipidemia, unspecified: Secondary | ICD-10-CM | POA: Diagnosis not present

## 2019-06-21 DIAGNOSIS — K625 Hemorrhage of anus and rectum: Secondary | ICD-10-CM | POA: Diagnosis not present

## 2019-06-21 DIAGNOSIS — Z79899 Other long term (current) drug therapy: Secondary | ICD-10-CM | POA: Diagnosis not present

## 2019-06-21 DIAGNOSIS — M1990 Unspecified osteoarthritis, unspecified site: Secondary | ICD-10-CM | POA: Diagnosis not present

## 2019-06-21 DIAGNOSIS — R2689 Other abnormalities of gait and mobility: Secondary | ICD-10-CM | POA: Diagnosis not present

## 2019-06-22 DIAGNOSIS — Z79899 Other long term (current) drug therapy: Secondary | ICD-10-CM | POA: Diagnosis not present

## 2019-06-22 DIAGNOSIS — M109 Gout, unspecified: Secondary | ICD-10-CM | POA: Diagnosis not present

## 2019-06-22 DIAGNOSIS — K625 Hemorrhage of anus and rectum: Secondary | ICD-10-CM | POA: Diagnosis not present

## 2019-06-22 DIAGNOSIS — J45909 Unspecified asthma, uncomplicated: Secondary | ICD-10-CM | POA: Diagnosis not present

## 2019-06-22 DIAGNOSIS — Z87891 Personal history of nicotine dependence: Secondary | ICD-10-CM | POA: Diagnosis not present

## 2019-06-22 DIAGNOSIS — R2689 Other abnormalities of gait and mobility: Secondary | ICD-10-CM | POA: Diagnosis not present

## 2019-06-22 DIAGNOSIS — K8020 Calculus of gallbladder without cholecystitis without obstruction: Secondary | ICD-10-CM | POA: Diagnosis not present

## 2019-06-22 DIAGNOSIS — J449 Chronic obstructive pulmonary disease, unspecified: Secondary | ICD-10-CM | POA: Diagnosis not present

## 2019-06-22 DIAGNOSIS — N179 Acute kidney failure, unspecified: Secondary | ICD-10-CM | POA: Diagnosis not present

## 2019-06-22 DIAGNOSIS — R8271 Bacteriuria: Secondary | ICD-10-CM | POA: Diagnosis not present

## 2019-06-22 DIAGNOSIS — M1990 Unspecified osteoarthritis, unspecified site: Secondary | ICD-10-CM | POA: Diagnosis not present

## 2019-06-22 DIAGNOSIS — E785 Hyperlipidemia, unspecified: Secondary | ICD-10-CM | POA: Diagnosis not present

## 2019-06-22 DIAGNOSIS — R001 Bradycardia, unspecified: Secondary | ICD-10-CM | POA: Diagnosis not present

## 2019-06-22 DIAGNOSIS — I10 Essential (primary) hypertension: Secondary | ICD-10-CM | POA: Diagnosis not present

## 2019-06-22 DIAGNOSIS — R1084 Generalized abdominal pain: Secondary | ICD-10-CM | POA: Diagnosis not present

## 2019-06-22 DIAGNOSIS — M6281 Muscle weakness (generalized): Secondary | ICD-10-CM | POA: Diagnosis not present

## 2019-06-22 DIAGNOSIS — R062 Wheezing: Secondary | ICD-10-CM | POA: Diagnosis not present

## 2019-06-22 DIAGNOSIS — G4733 Obstructive sleep apnea (adult) (pediatric): Secondary | ICD-10-CM | POA: Diagnosis not present

## 2019-06-22 DIAGNOSIS — B9620 Unspecified Escherichia coli [E. coli] as the cause of diseases classified elsewhere: Secondary | ICD-10-CM | POA: Diagnosis not present

## 2019-06-22 IMAGING — CR DG PELVIS 1-2V
1 series · 1 of 1 positions shown · non-contrast
Comparison: 12/17/2013

CLINICAL DATA: Fall

EXAM:
PELVIS - 1-2 VIEW

[pelvis ap]
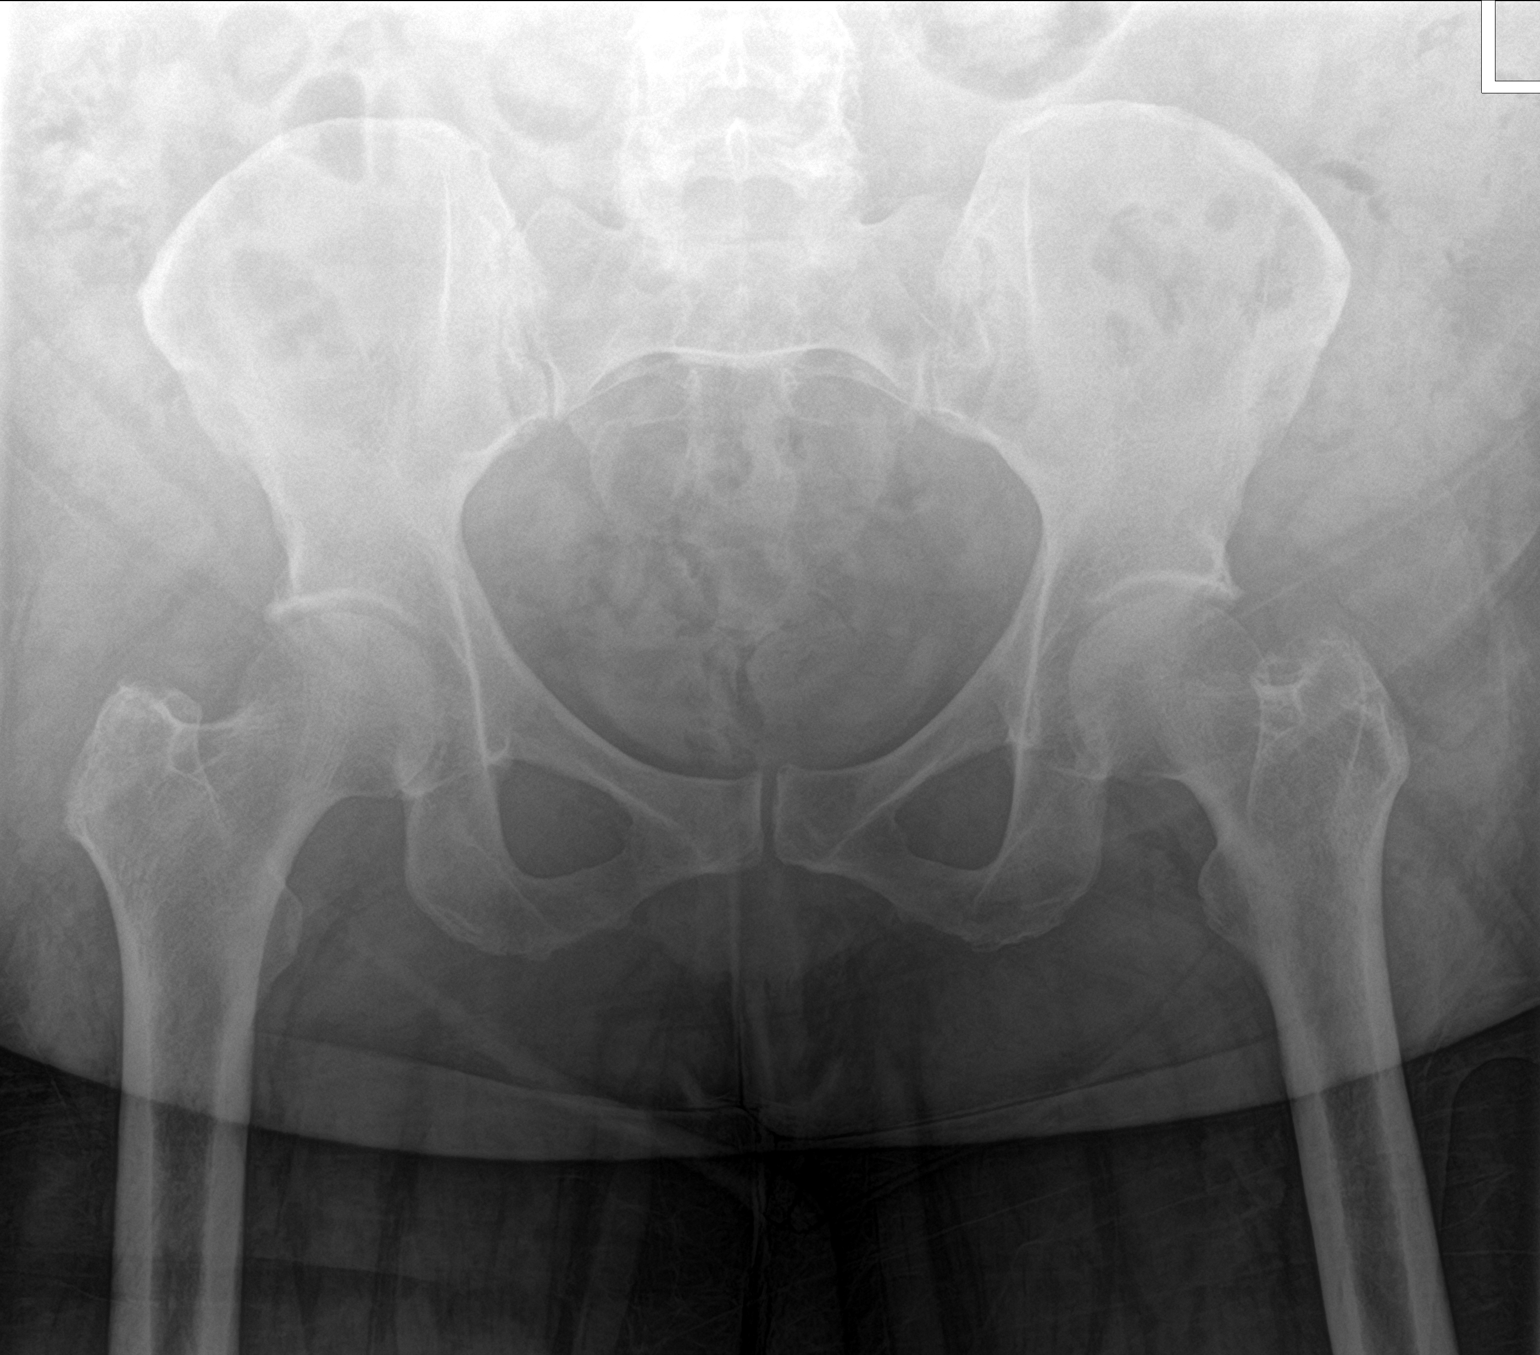

[1 of 1 positions shown; findings below may reference images not displayed]

FINDINGS: SI joints do not appear widened. The pubic symphysis is intact. The
rami are unremarkable. Mild arthritis at the bilateral hips. No
fracture or dislocation.
IMPRESSION: No acute osseous abnormality

## 2019-06-22 IMAGING — CR DG KNEE 1-2V*R*
2 series · 2 of 2 positions shown · non-contrast
Comparison: None.

CLINICAL DATA: Fall with injury to the leg

EXAM:
RIGHT KNEE - 1-2 VIEW

[knee ap]
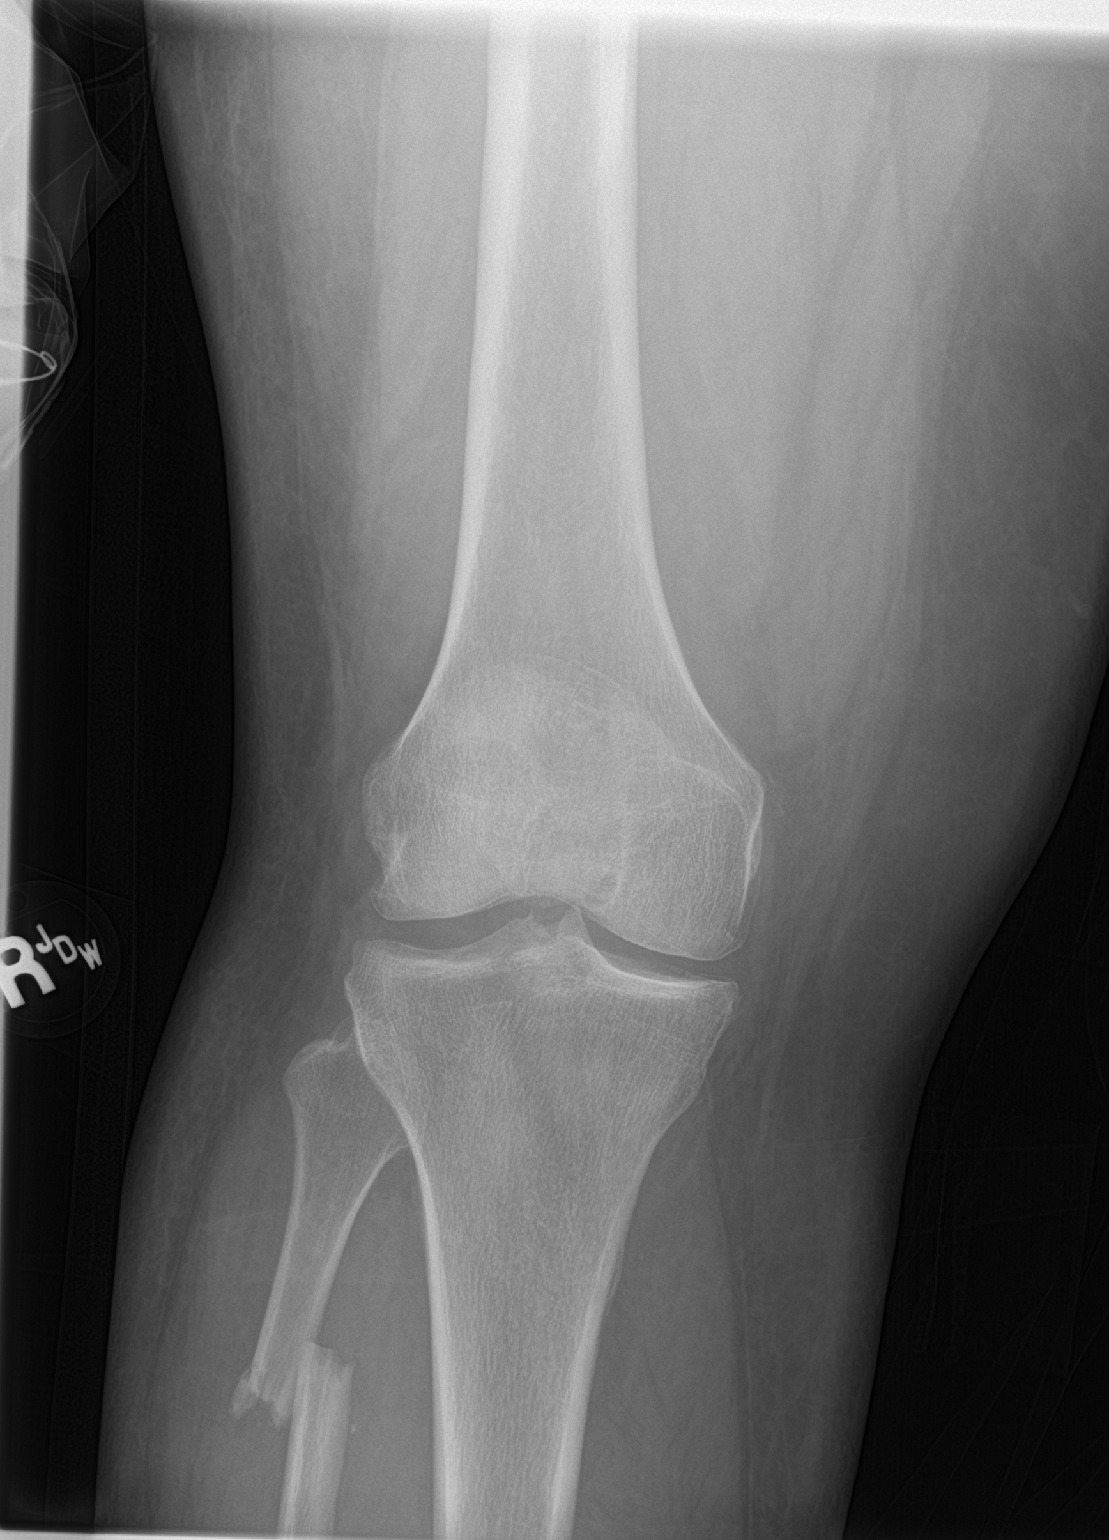

[knee lat]
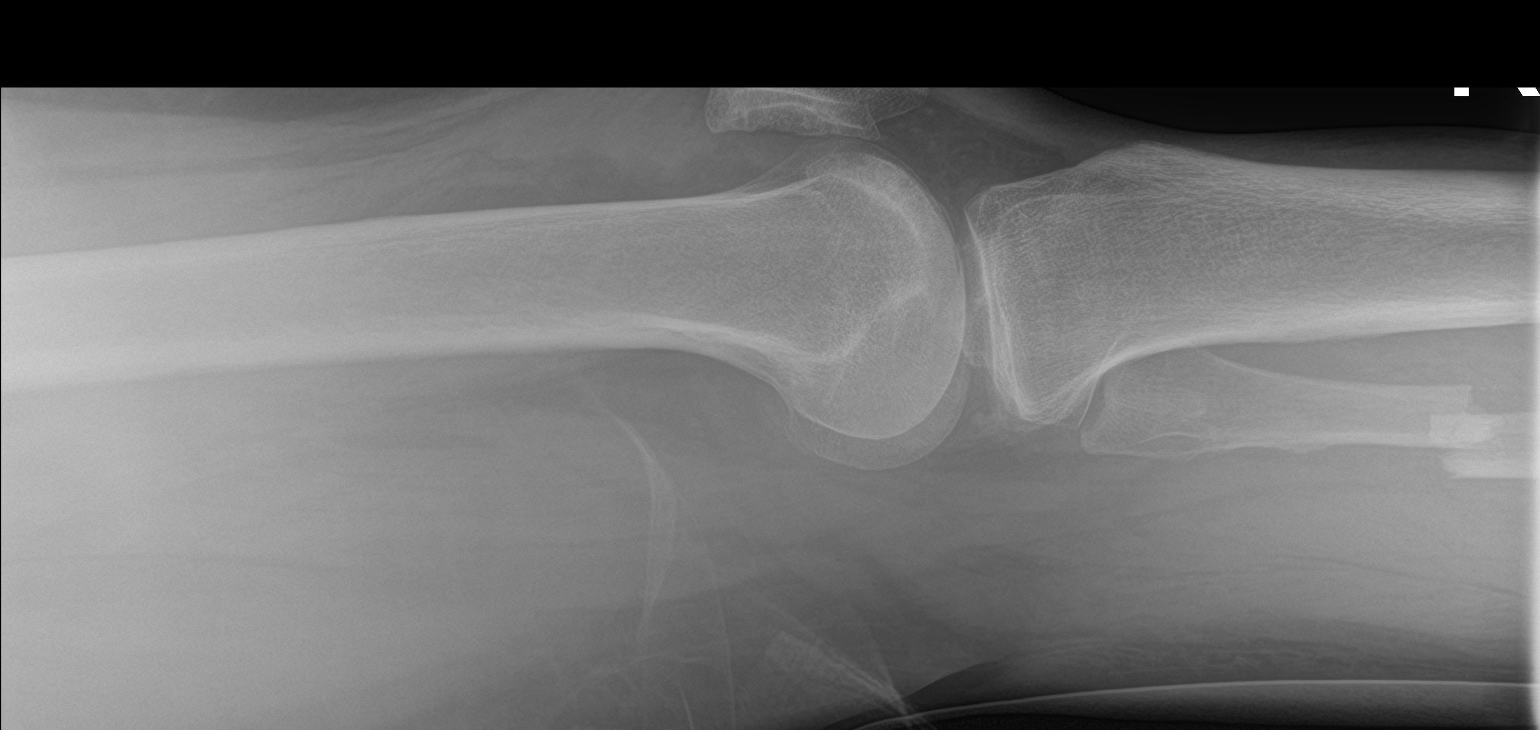

[2 of 2 positions shown; findings below may reference images not displayed]

FINDINGS: Fracture through the proximal shaft of the fibula with displacement
and overriding. Mild degenerative changes at the patellofemoral
compartment and medial joint space. Trace suprapatellar effusion.
IMPRESSION: 1. Displaced proximal fibular fracture
2. Trace joint effusion

## 2019-06-22 IMAGING — CR DG FEMUR 2+V*R*
5 series · 6 of 6 positions shown · non-contrast
Comparison: None.

CLINICAL DATA: History of fall, deformity to right tibia with pain
in the entire right lower leg

EXAM:
RIGHT FEMUR 2 VIEWS

[femur ap (1 of 2)]
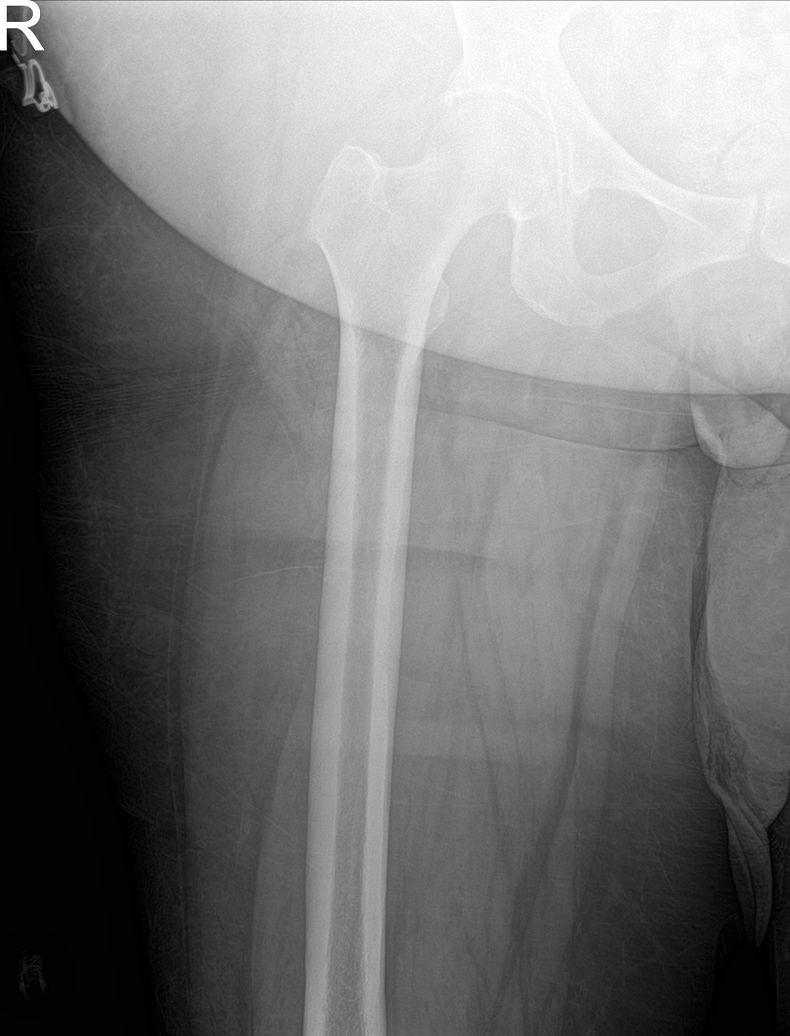

[femur ap (2 of 2)]
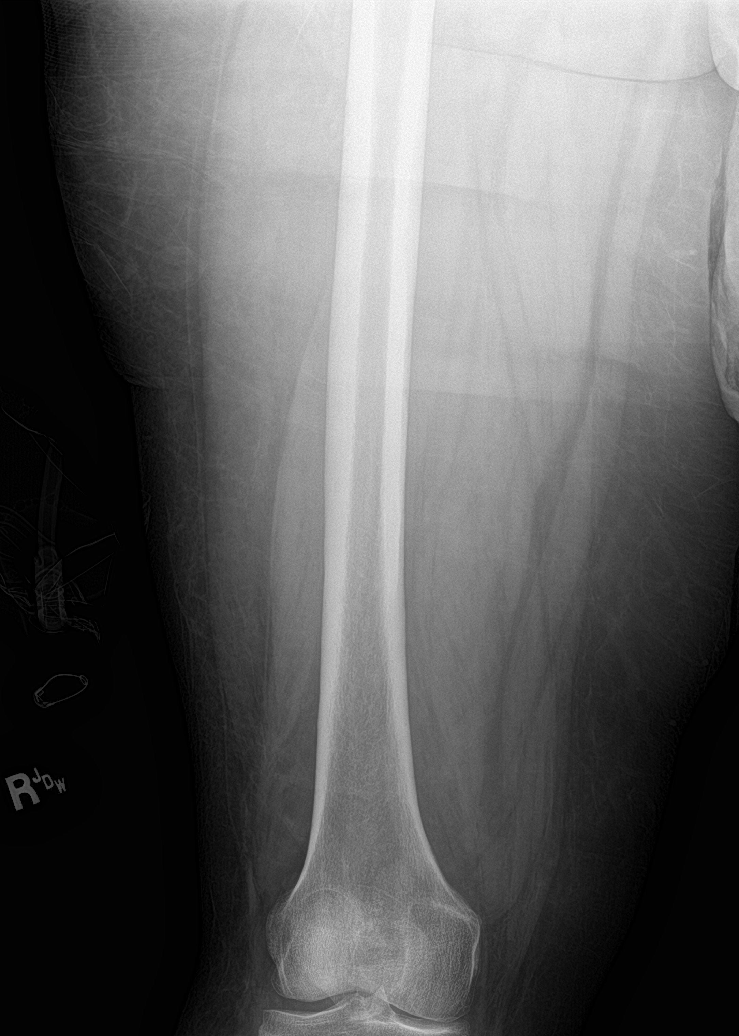

[femur lat (1 of 3)]
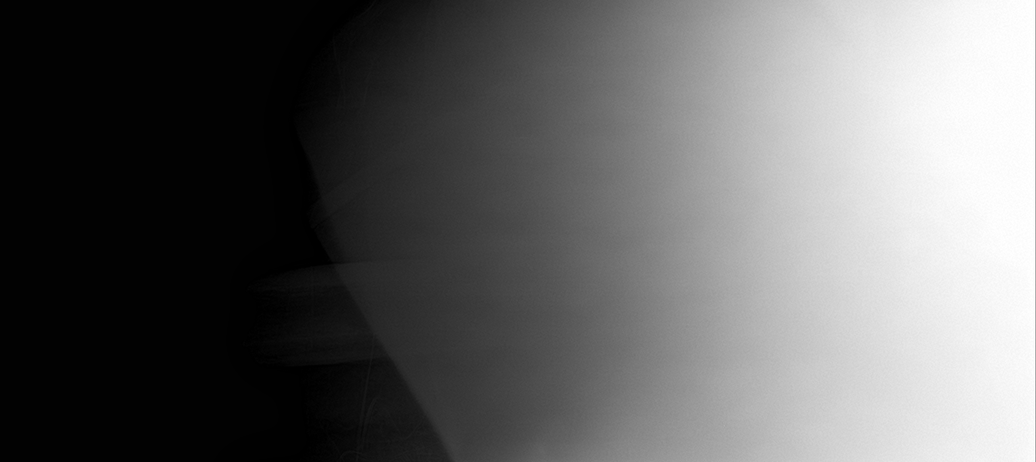

[femur lat (2 of 3)]
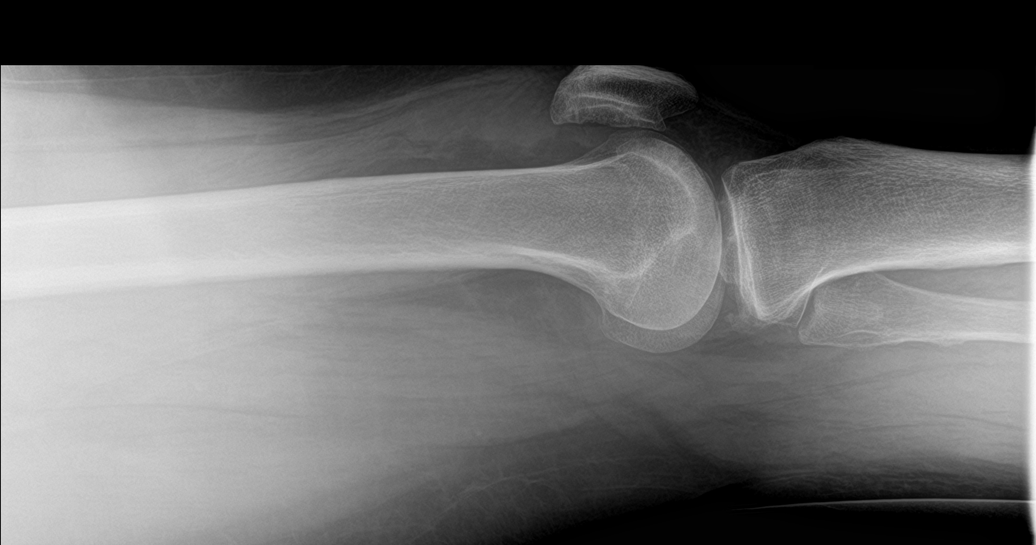

[Series 5: femur lat · 0.14mm/px · 2 of 2 slices shown (3 of 3)]
[im 1/2]
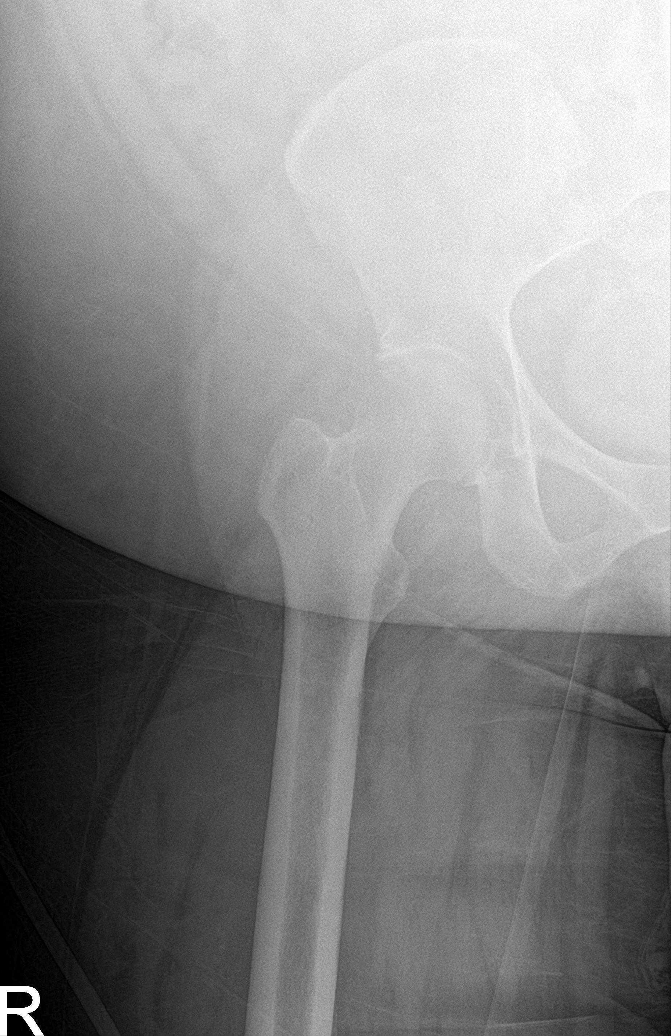
[im 2/2]
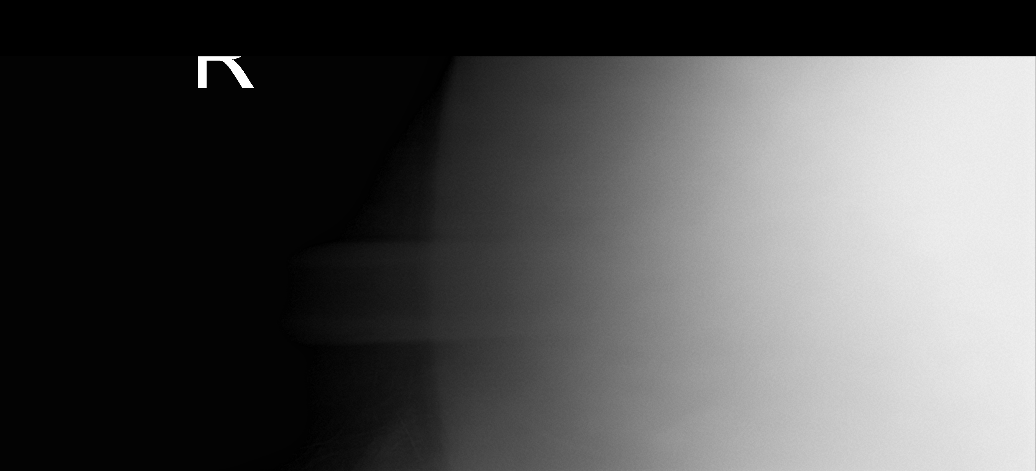

[6 of 6 positions shown; findings below may reference images not displayed]

FINDINGS: Mild patellofemoral degenerative change. No acute fracture or
malalignment. Trace suprapatellar effusion. Limited lateral views of
the right femur due to habitus and positioning.
IMPRESSION: No definite acute osseous abnormality allowing for habitus and
limited positioning

## 2019-06-22 IMAGING — CR DG TIBIA/FIBULA 2V*R*
4 series · 4 of 4 positions shown · non-contrast
Comparison: None.

CLINICAL DATA: Fall

EXAM:
RIGHT TIBIA AND FIBULA - 2 VIEW

[tibia ap (1 of 2)]
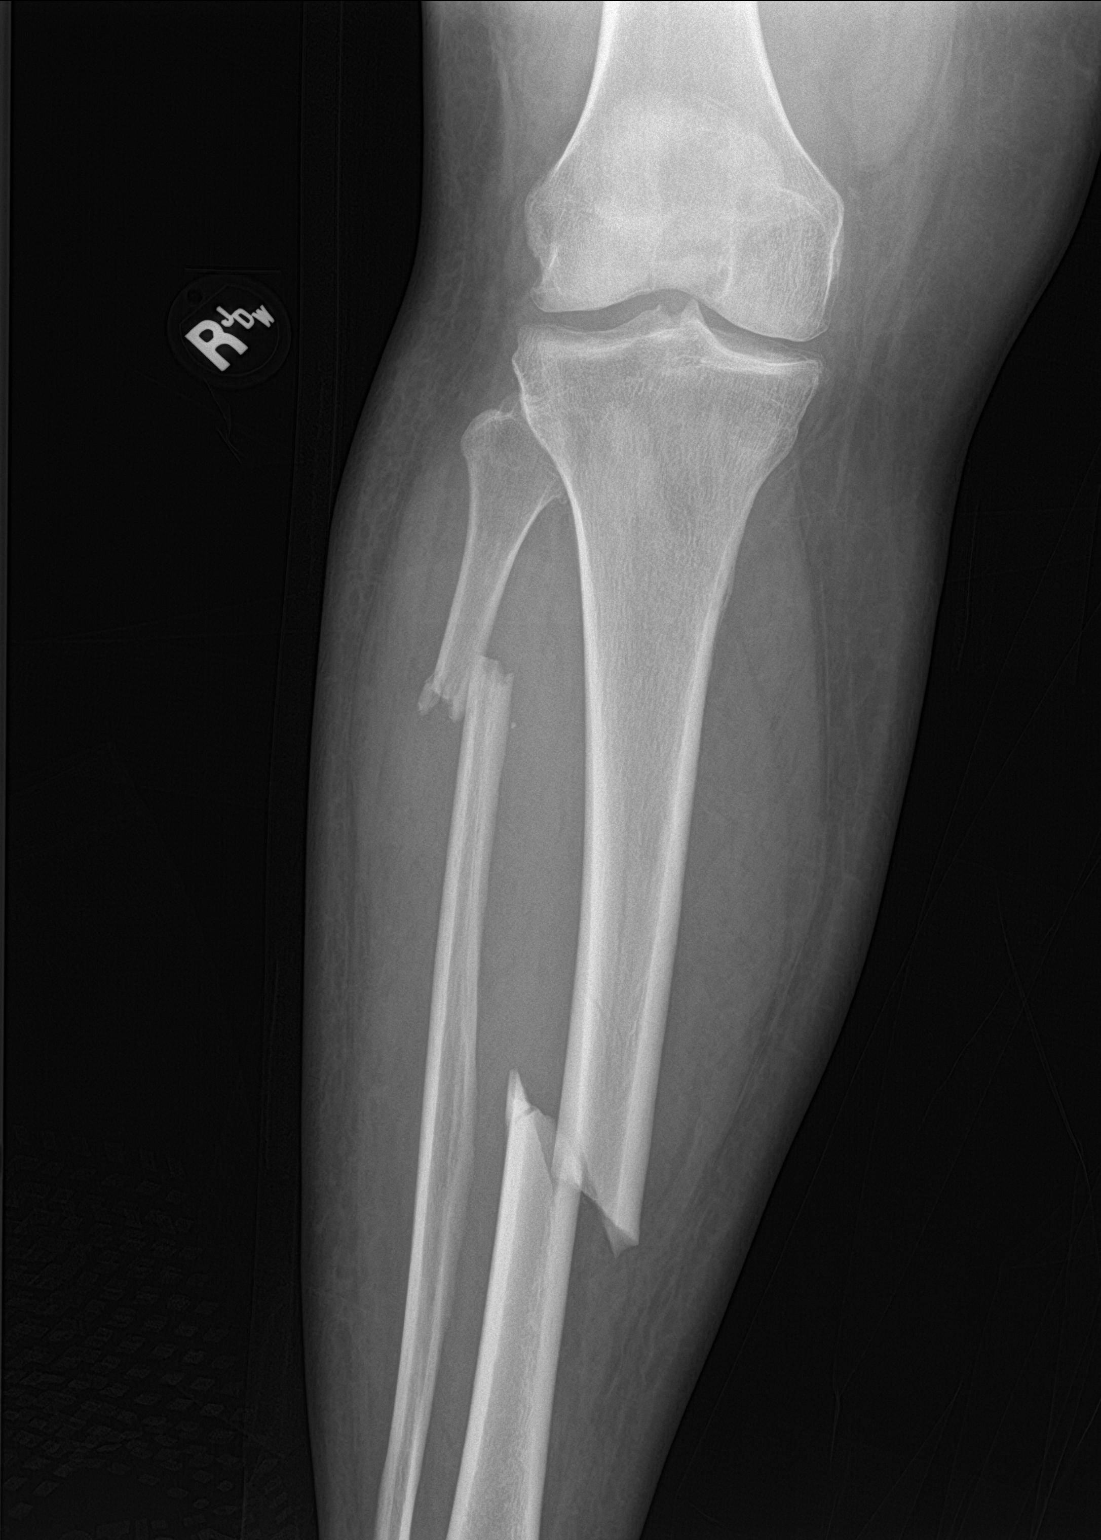

[tibia ap (2 of 2)]
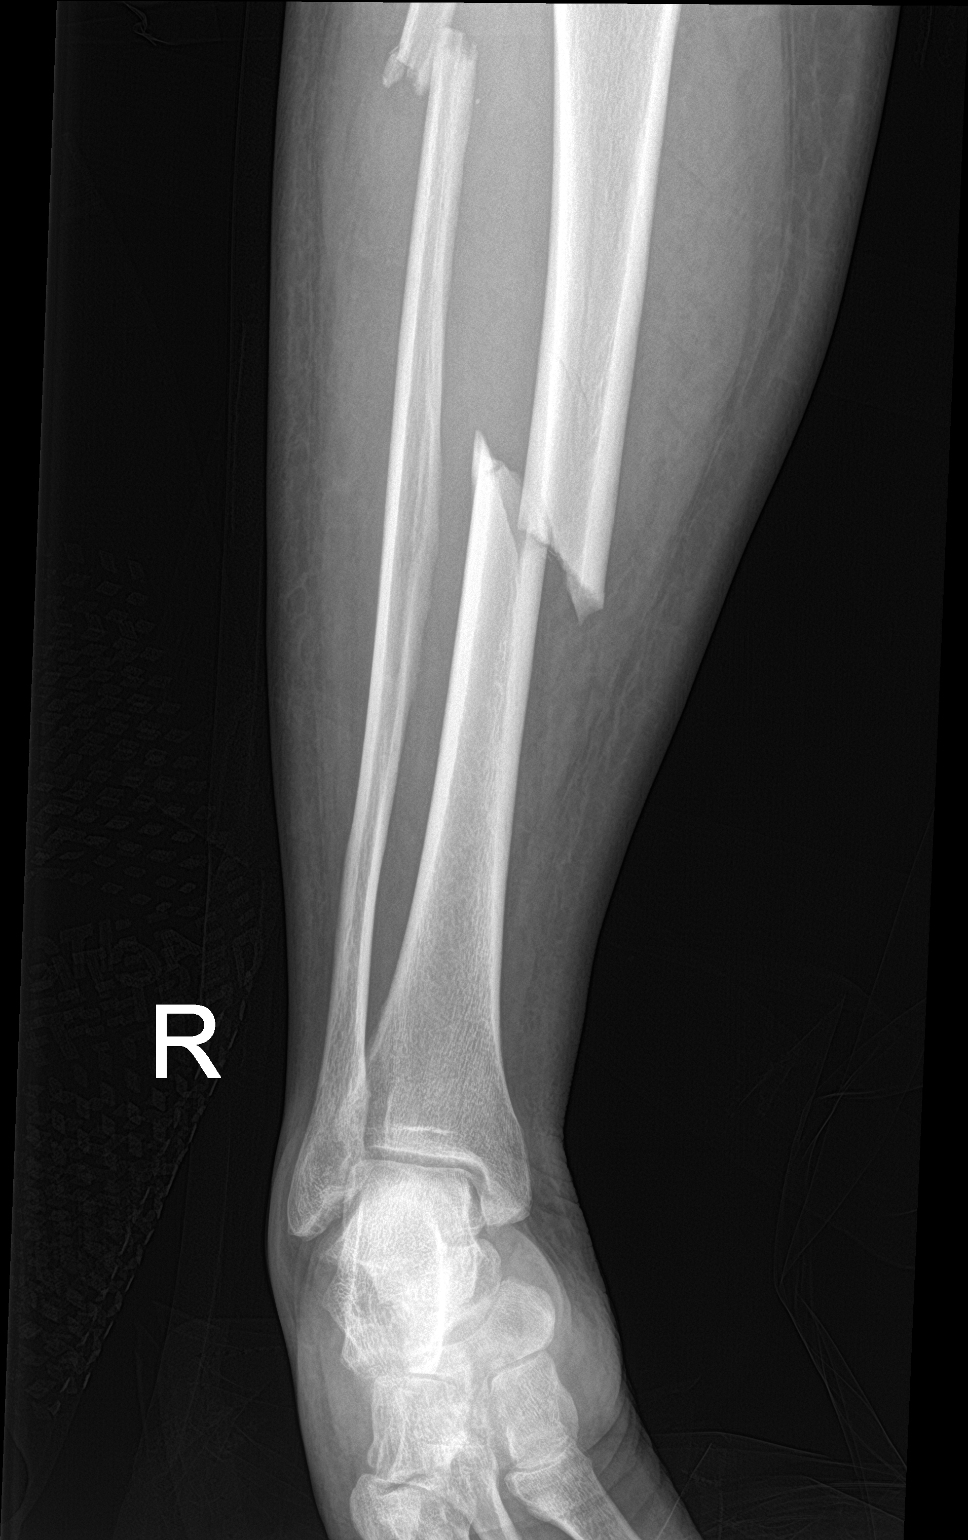

[tibia lat (1 of 2)]
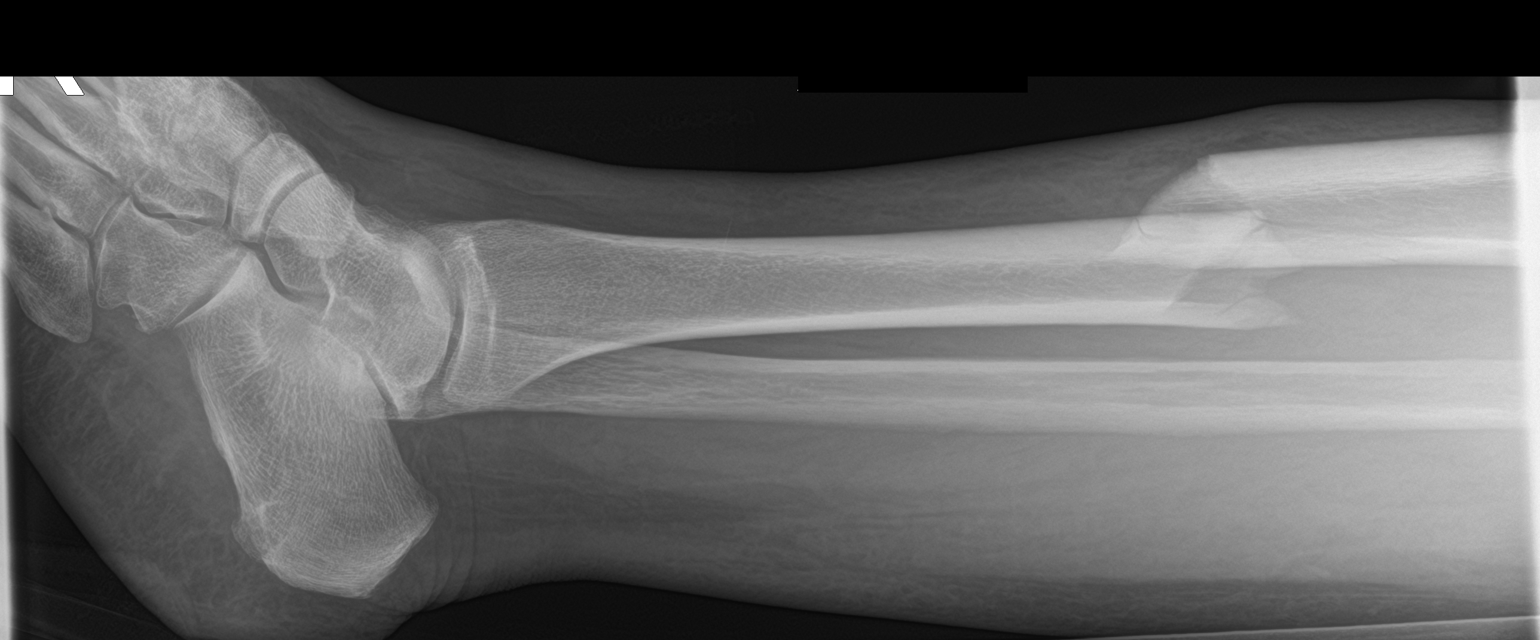

[tibia lat (2 of 2)]
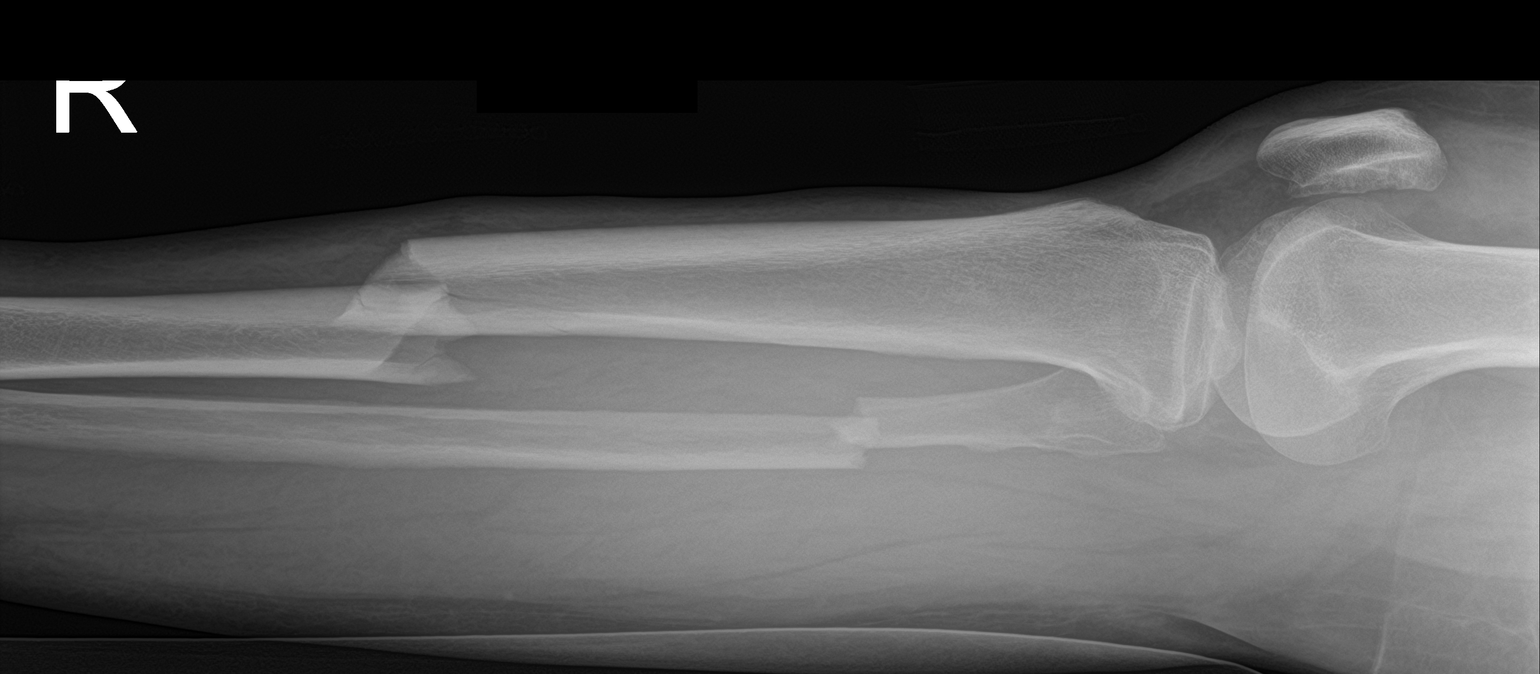

[4 of 4 positions shown; findings below may reference images not displayed]

FINDINGS: Acute fracture involving the proximal shaft of the fibula with 1
bone with of medial displacement of the distal fracture fragment and
approximately 16 mm of overriding. [DATE] bone with of posterior
displacement of distal fibular fracture fragment.

Additional acute fracture involving the midshaft of the tibia with
just under 1 bone with of lateral displacement of distal fracture
fragment. About [DATE] bone with of posterior displacement of distal
fracture fragment.
IMPRESSION: 1. Acute displaced and overriding proximal fibular fracture
2. Acute displaced mid tibial fracture

## 2019-06-22 IMAGING — CR DG CHEST 1V
1 series · 1 of 1 positions shown · non-contrast
Comparison: 05/30/2016

CLINICAL DATA: Fall with leg injury

EXAM:
CHEST 1 VIEW

[chest ap]
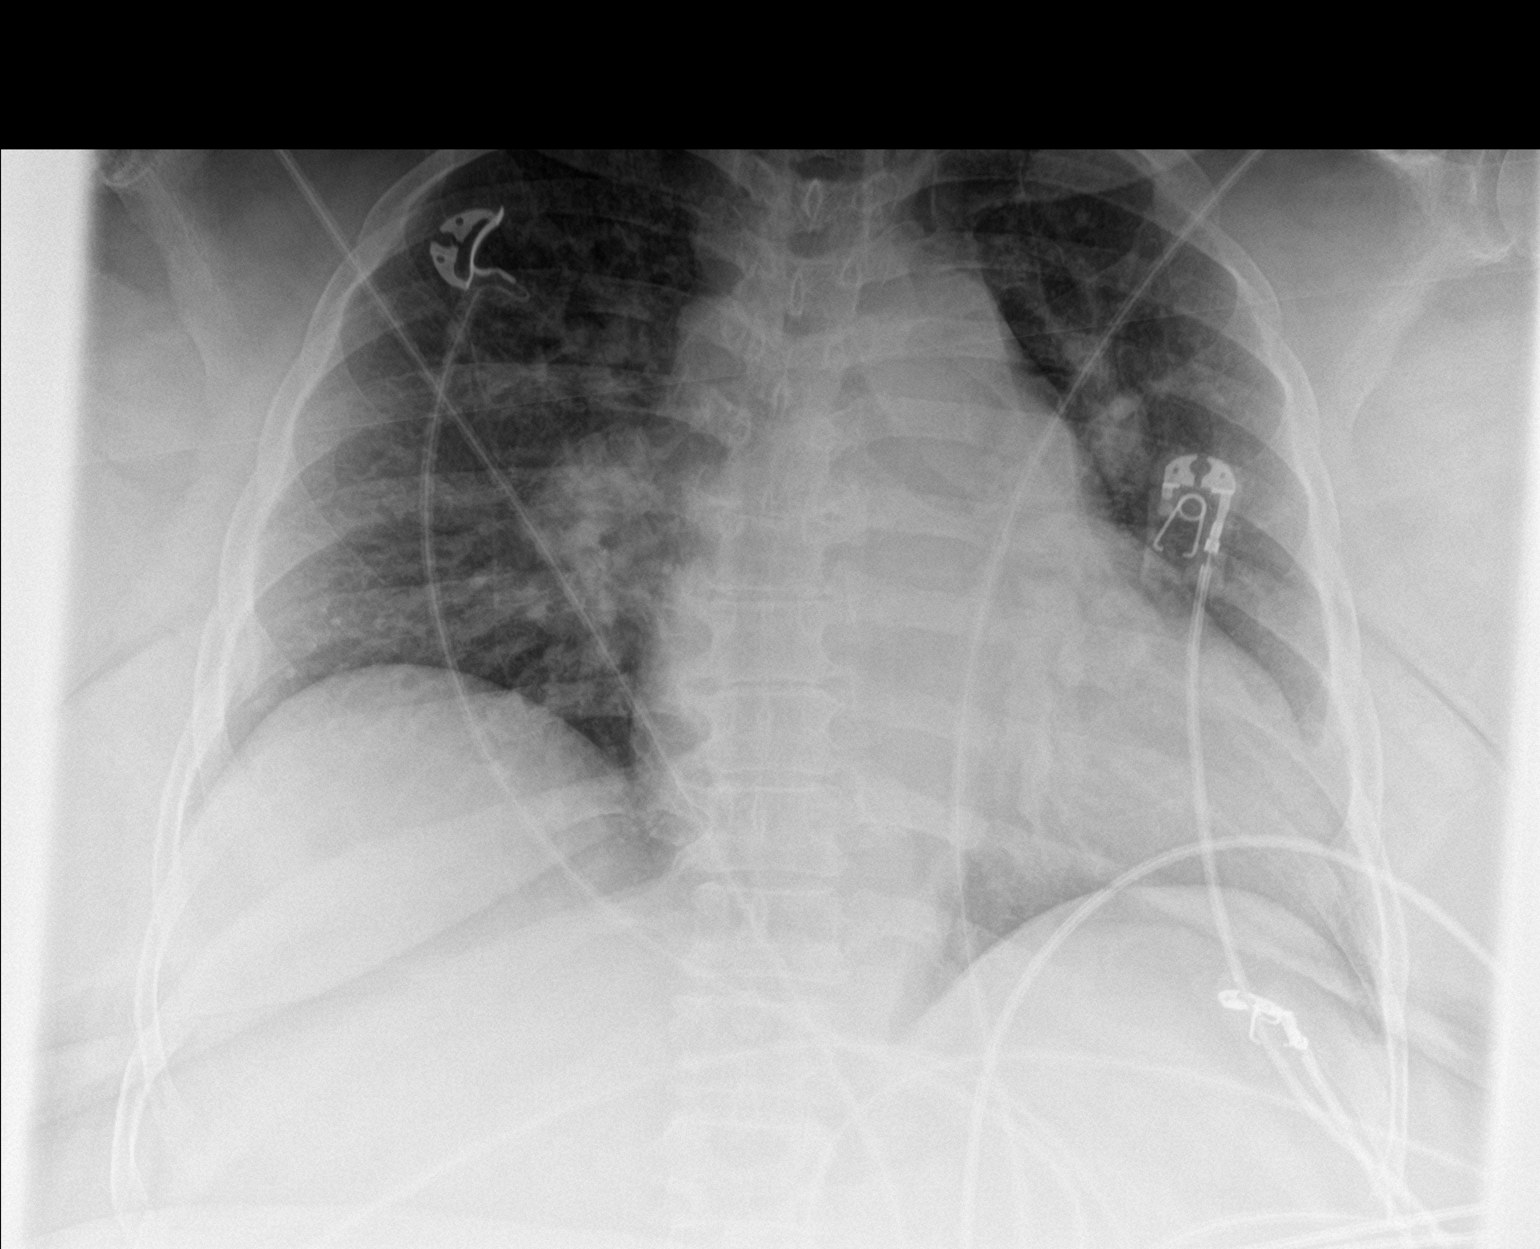

[1 of 1 positions shown; findings below may reference images not displayed]

FINDINGS: Mild cardiomegaly with prominent central pulmonary artery and mild
central vascular congestion. No acute consolidation. No pleural
effusion. No pneumothorax.
IMPRESSION: Cardiomegaly with central vascular congestion.

## 2019-06-22 IMAGING — CR DG ANKLE 2V *R*
2 series · 2 of 2 positions shown · non-contrast
Comparison: None.

CLINICAL DATA: Fall

EXAM:
RIGHT ANKLE - 2 VIEW

[ankle ap]
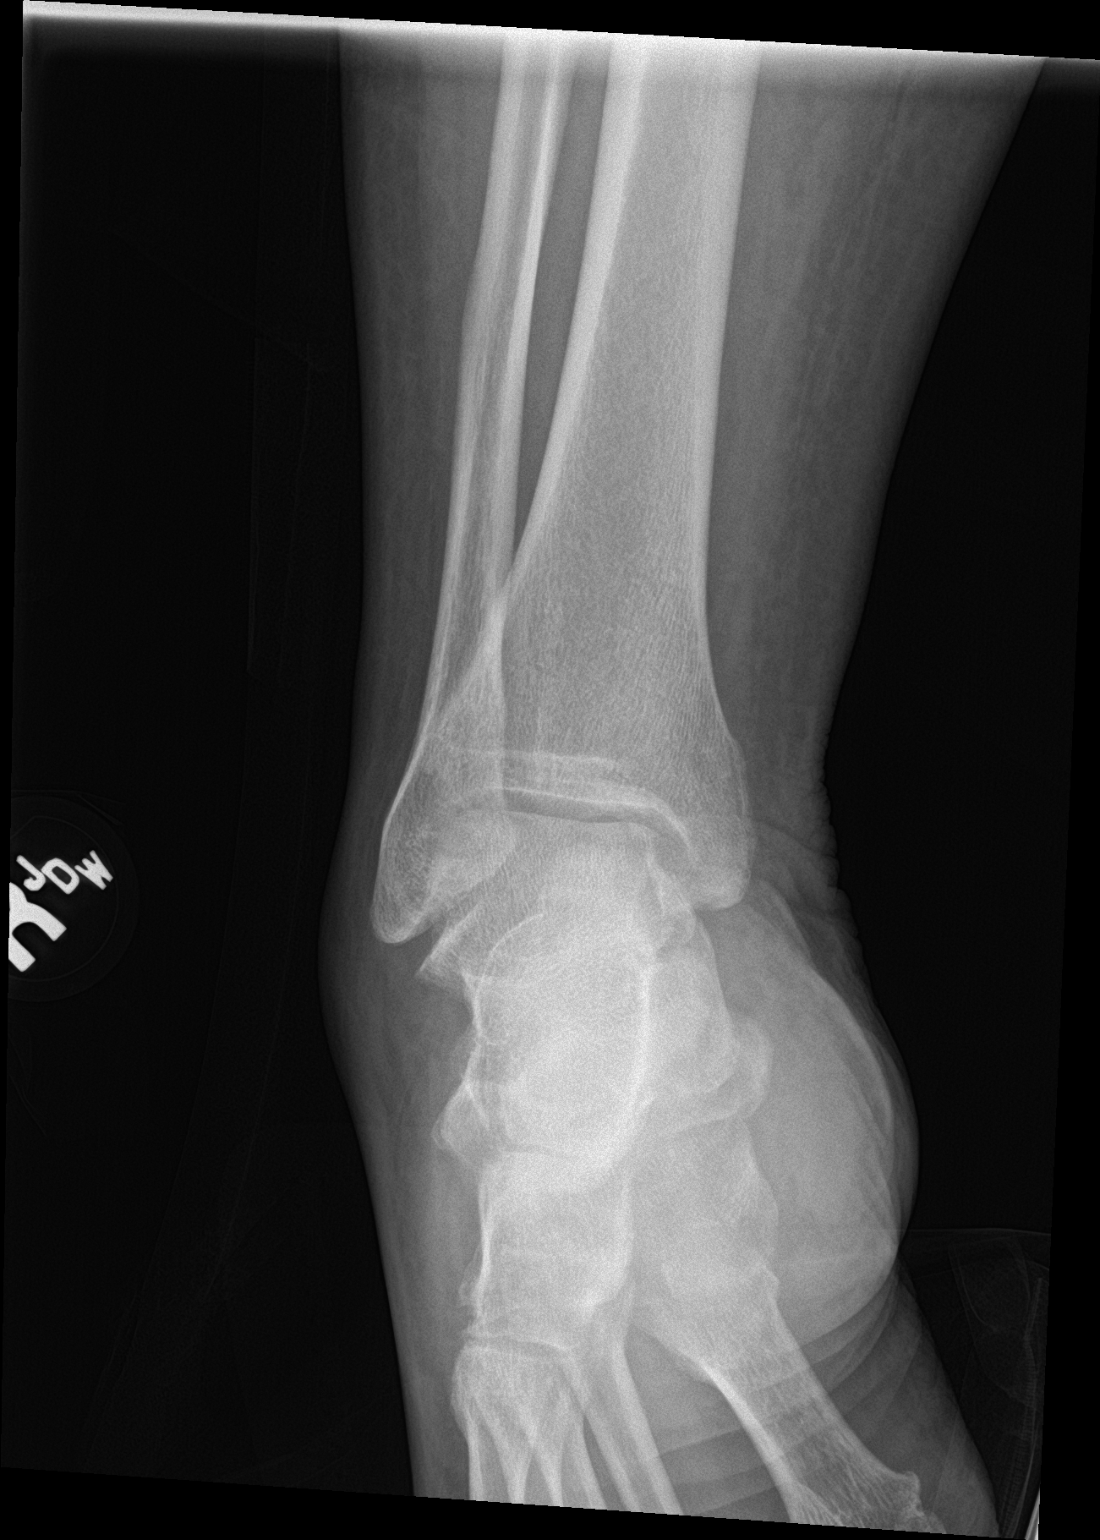

[ankle lat]
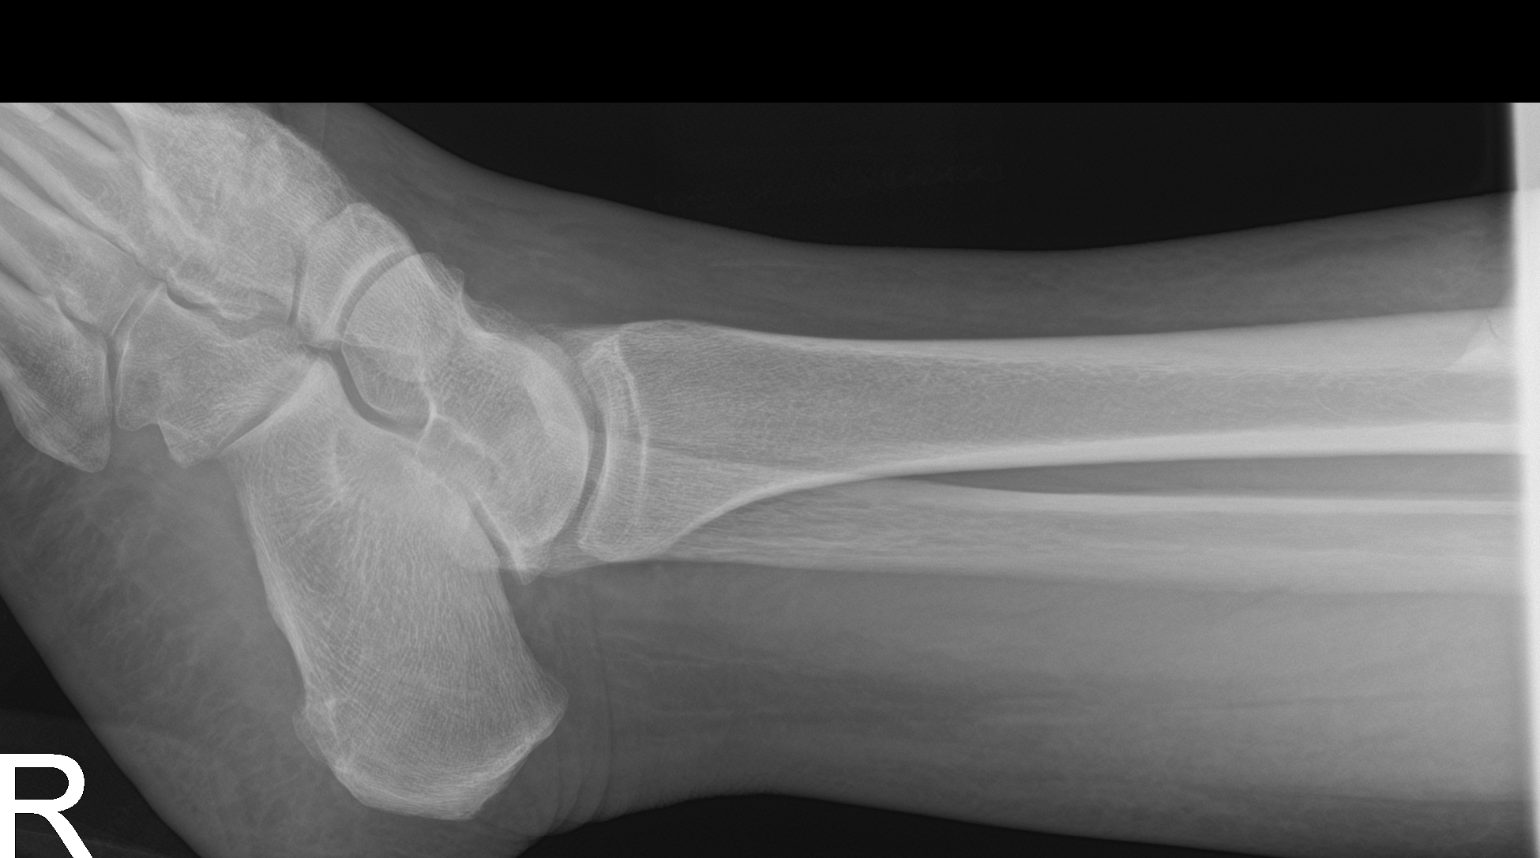

[2 of 2 positions shown; findings below may reference images not displayed]

FINDINGS: There is no evidence of fracture, dislocation, or joint effusion.
Limited cross-table lateral positioning.
IMPRESSION: No acute osseous abnormality

## 2019-06-23 DIAGNOSIS — Z79899 Other long term (current) drug therapy: Secondary | ICD-10-CM | POA: Diagnosis not present

## 2019-06-23 DIAGNOSIS — M109 Gout, unspecified: Secondary | ICD-10-CM | POA: Diagnosis not present

## 2019-06-23 DIAGNOSIS — R1084 Generalized abdominal pain: Secondary | ICD-10-CM | POA: Diagnosis not present

## 2019-06-23 DIAGNOSIS — G4733 Obstructive sleep apnea (adult) (pediatric): Secondary | ICD-10-CM | POA: Diagnosis not present

## 2019-06-23 DIAGNOSIS — M1990 Unspecified osteoarthritis, unspecified site: Secondary | ICD-10-CM | POA: Diagnosis not present

## 2019-06-23 DIAGNOSIS — B9620 Unspecified Escherichia coli [E. coli] as the cause of diseases classified elsewhere: Secondary | ICD-10-CM | POA: Diagnosis not present

## 2019-06-23 DIAGNOSIS — I10 Essential (primary) hypertension: Secondary | ICD-10-CM | POA: Diagnosis not present

## 2019-06-23 DIAGNOSIS — E785 Hyperlipidemia, unspecified: Secondary | ICD-10-CM | POA: Diagnosis not present

## 2019-06-23 DIAGNOSIS — K8020 Calculus of gallbladder without cholecystitis without obstruction: Secondary | ICD-10-CM | POA: Diagnosis not present

## 2019-06-23 DIAGNOSIS — N390 Urinary tract infection, site not specified: Secondary | ICD-10-CM | POA: Diagnosis not present

## 2019-06-23 DIAGNOSIS — K625 Hemorrhage of anus and rectum: Secondary | ICD-10-CM | POA: Diagnosis not present

## 2019-06-23 DIAGNOSIS — R2689 Other abnormalities of gait and mobility: Secondary | ICD-10-CM | POA: Diagnosis not present

## 2019-06-23 DIAGNOSIS — N179 Acute kidney failure, unspecified: Secondary | ICD-10-CM | POA: Diagnosis not present

## 2019-06-23 DIAGNOSIS — Z87891 Personal history of nicotine dependence: Secondary | ICD-10-CM | POA: Diagnosis not present

## 2019-06-24 DIAGNOSIS — K8020 Calculus of gallbladder without cholecystitis without obstruction: Secondary | ICD-10-CM | POA: Diagnosis not present

## 2019-06-24 DIAGNOSIS — R2689 Other abnormalities of gait and mobility: Secondary | ICD-10-CM | POA: Diagnosis not present

## 2019-06-24 DIAGNOSIS — M1990 Unspecified osteoarthritis, unspecified site: Secondary | ICD-10-CM | POA: Diagnosis not present

## 2019-06-24 DIAGNOSIS — Z79899 Other long term (current) drug therapy: Secondary | ICD-10-CM | POA: Diagnosis not present

## 2019-06-24 DIAGNOSIS — R1084 Generalized abdominal pain: Secondary | ICD-10-CM | POA: Diagnosis not present

## 2019-06-24 DIAGNOSIS — Z87891 Personal history of nicotine dependence: Secondary | ICD-10-CM | POA: Diagnosis not present

## 2019-06-24 DIAGNOSIS — B9620 Unspecified Escherichia coli [E. coli] as the cause of diseases classified elsewhere: Secondary | ICD-10-CM | POA: Diagnosis not present

## 2019-06-24 DIAGNOSIS — J449 Chronic obstructive pulmonary disease, unspecified: Secondary | ICD-10-CM | POA: Diagnosis not present

## 2019-06-24 DIAGNOSIS — G4733 Obstructive sleep apnea (adult) (pediatric): Secondary | ICD-10-CM | POA: Diagnosis not present

## 2019-06-24 DIAGNOSIS — E785 Hyperlipidemia, unspecified: Secondary | ICD-10-CM | POA: Diagnosis not present

## 2019-06-24 DIAGNOSIS — N179 Acute kidney failure, unspecified: Secondary | ICD-10-CM | POA: Diagnosis not present

## 2019-06-24 DIAGNOSIS — N390 Urinary tract infection, site not specified: Secondary | ICD-10-CM | POA: Diagnosis not present

## 2019-06-24 DIAGNOSIS — I10 Essential (primary) hypertension: Secondary | ICD-10-CM | POA: Diagnosis not present

## 2019-06-24 DIAGNOSIS — K625 Hemorrhage of anus and rectum: Secondary | ICD-10-CM | POA: Diagnosis not present

## 2019-06-24 DIAGNOSIS — M109 Gout, unspecified: Secondary | ICD-10-CM | POA: Diagnosis not present

## 2019-06-25 DIAGNOSIS — M1990 Unspecified osteoarthritis, unspecified site: Secondary | ICD-10-CM | POA: Diagnosis not present

## 2019-06-25 DIAGNOSIS — Z87891 Personal history of nicotine dependence: Secondary | ICD-10-CM | POA: Diagnosis not present

## 2019-06-25 DIAGNOSIS — R2689 Other abnormalities of gait and mobility: Secondary | ICD-10-CM | POA: Diagnosis not present

## 2019-06-25 DIAGNOSIS — Z79899 Other long term (current) drug therapy: Secondary | ICD-10-CM | POA: Diagnosis not present

## 2019-06-25 DIAGNOSIS — R1084 Generalized abdominal pain: Secondary | ICD-10-CM | POA: Diagnosis not present

## 2019-06-25 DIAGNOSIS — R062 Wheezing: Secondary | ICD-10-CM | POA: Diagnosis not present

## 2019-06-25 DIAGNOSIS — K8020 Calculus of gallbladder without cholecystitis without obstruction: Secondary | ICD-10-CM | POA: Diagnosis not present

## 2019-06-25 DIAGNOSIS — I10 Essential (primary) hypertension: Secondary | ICD-10-CM | POA: Diagnosis not present

## 2019-06-25 DIAGNOSIS — N179 Acute kidney failure, unspecified: Secondary | ICD-10-CM | POA: Diagnosis not present

## 2019-06-25 DIAGNOSIS — M109 Gout, unspecified: Secondary | ICD-10-CM | POA: Diagnosis not present

## 2019-06-25 DIAGNOSIS — E119 Type 2 diabetes mellitus without complications: Secondary | ICD-10-CM | POA: Diagnosis not present

## 2019-06-25 DIAGNOSIS — G4733 Obstructive sleep apnea (adult) (pediatric): Secondary | ICD-10-CM | POA: Diagnosis not present

## 2019-06-25 DIAGNOSIS — E785 Hyperlipidemia, unspecified: Secondary | ICD-10-CM | POA: Diagnosis not present

## 2019-06-25 DIAGNOSIS — K625 Hemorrhage of anus and rectum: Secondary | ICD-10-CM | POA: Diagnosis not present

## 2019-06-25 DIAGNOSIS — J45909 Unspecified asthma, uncomplicated: Secondary | ICD-10-CM | POA: Diagnosis not present

## 2019-06-25 DIAGNOSIS — G2401 Drug induced subacute dyskinesia: Secondary | ICD-10-CM | POA: Diagnosis not present

## 2019-06-25 IMAGING — DX DG TIBIA/FIBULA PORT 2V*R*
1 series · 2 of 2 positions shown · non-contrast
Comparison: 07/09/2016

CLINICAL DATA: Postop fracture fixation.

EXAM:
PORTABLE RIGHT TIBIA AND FIBULA - 2 VIEW

[Series 1: leg · 0.14mm/px · 2 of 2 slices shown]
[im 1/2]
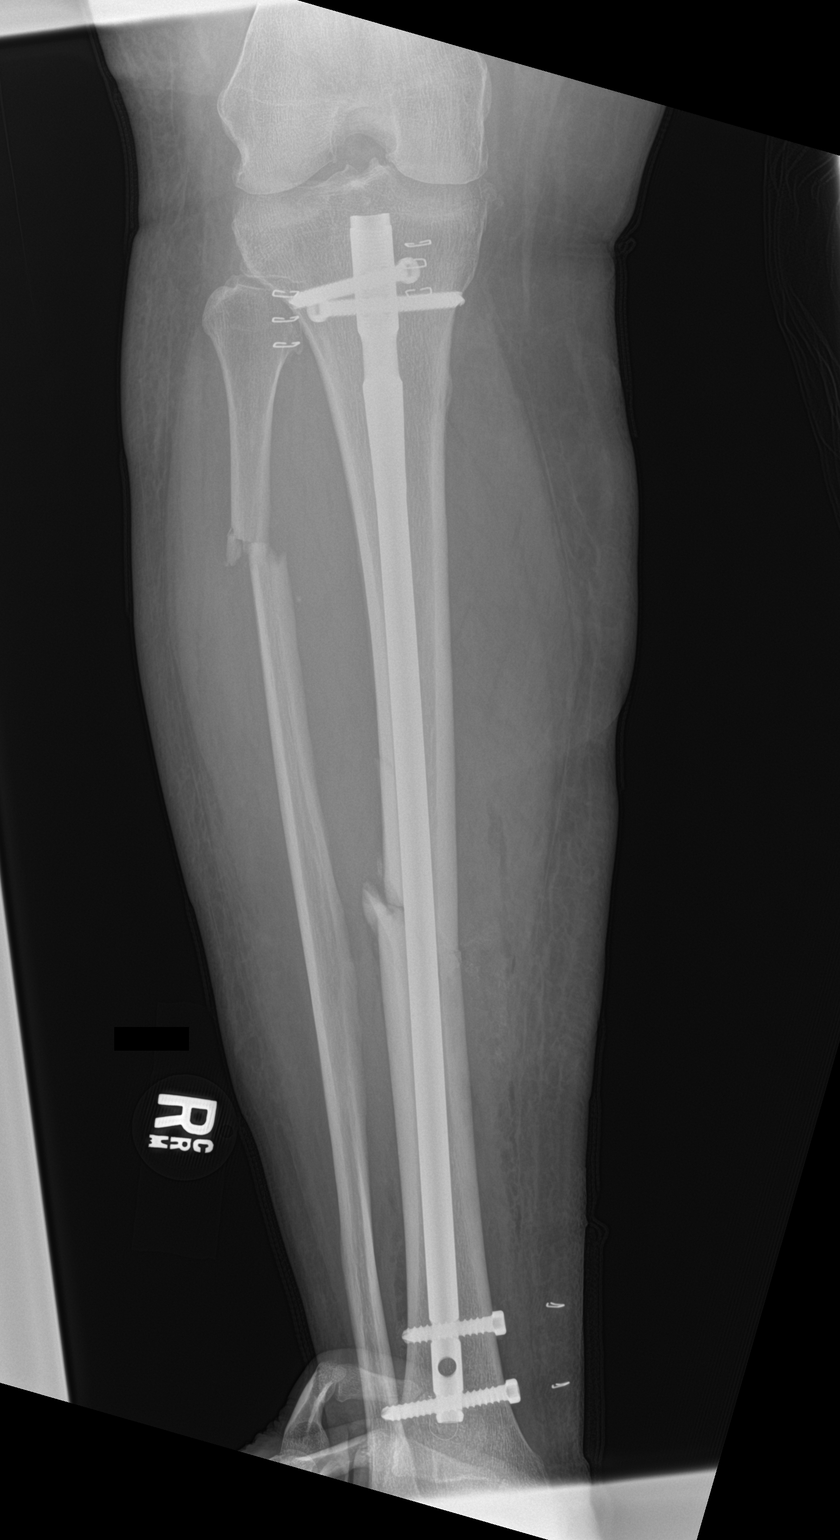
[im 2/2]
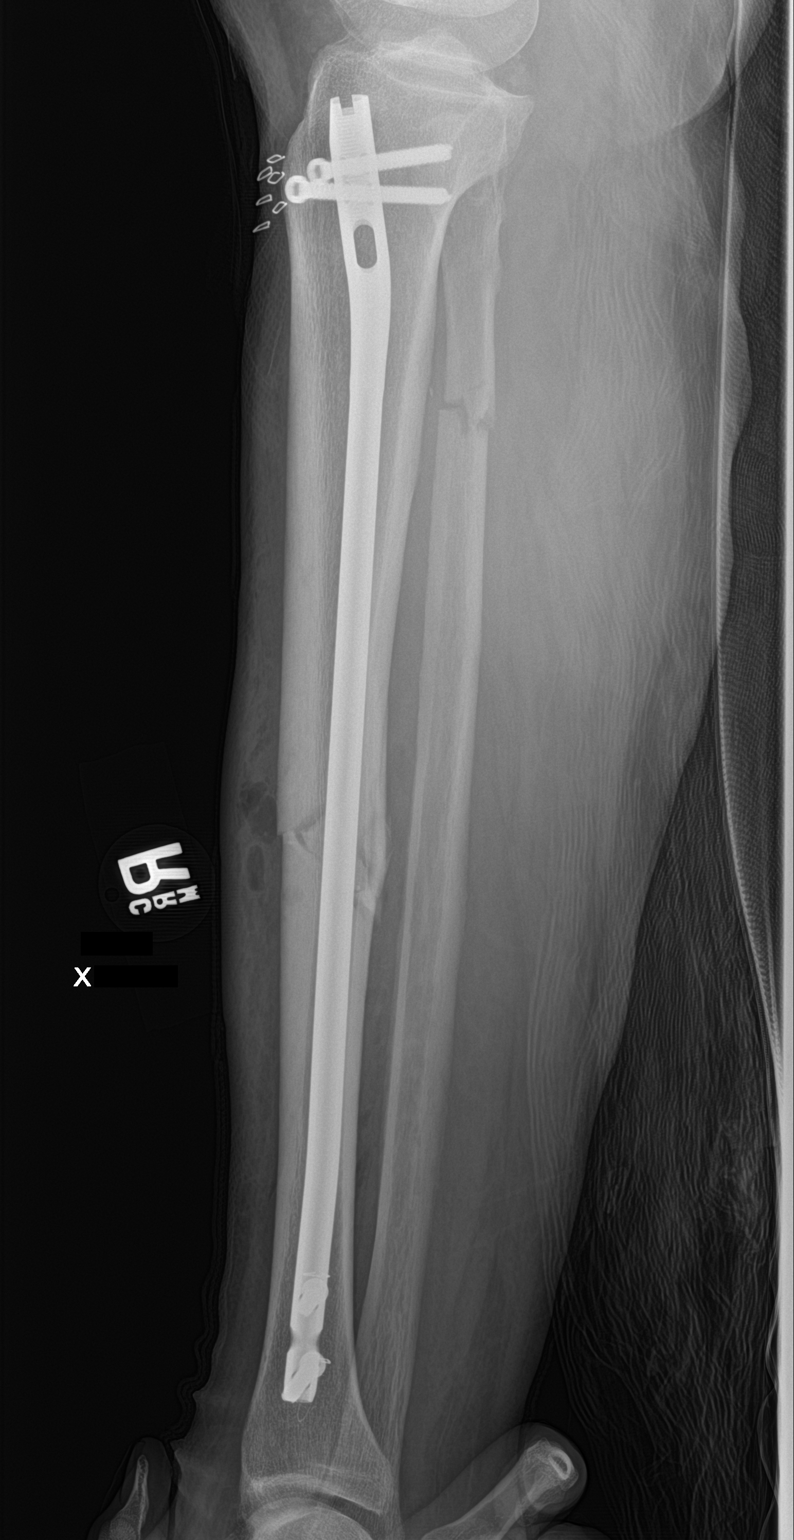

[2 of 2 positions shown; findings below may reference images not displayed]

FINDINGS: Interval fixation of the mid tibia fracture with a intramedullary
rod along with 2 proximal and 2 distal interlocking screws. Good
position and alignment without complicating features.

There is also improved position and alignment of the proximal
fibular shaft fracture. One half shaft width of medial displacement.

The knee and ankle joints are maintained.
IMPRESSION: Internal fixation of mid tibia fracture with an intramedullary rod.
Anatomic alignment without complicating features.

Improved position and alignment of the proximal fibular shaft
fracture.

## 2019-06-25 IMAGING — RF DG C-ARM 61-120 MIN
1 series · 5 of 5 positions shown · non-contrast
Comparison: None.

CLINICAL DATA: Fracture after a fall.

EXAM:
DG C-ARM 61-120 MIN; RIGHT TIBIA AND FIBULA - 2 VIEW

[Series 1: run · 5 of 5 slices shown]
[im 1/5]
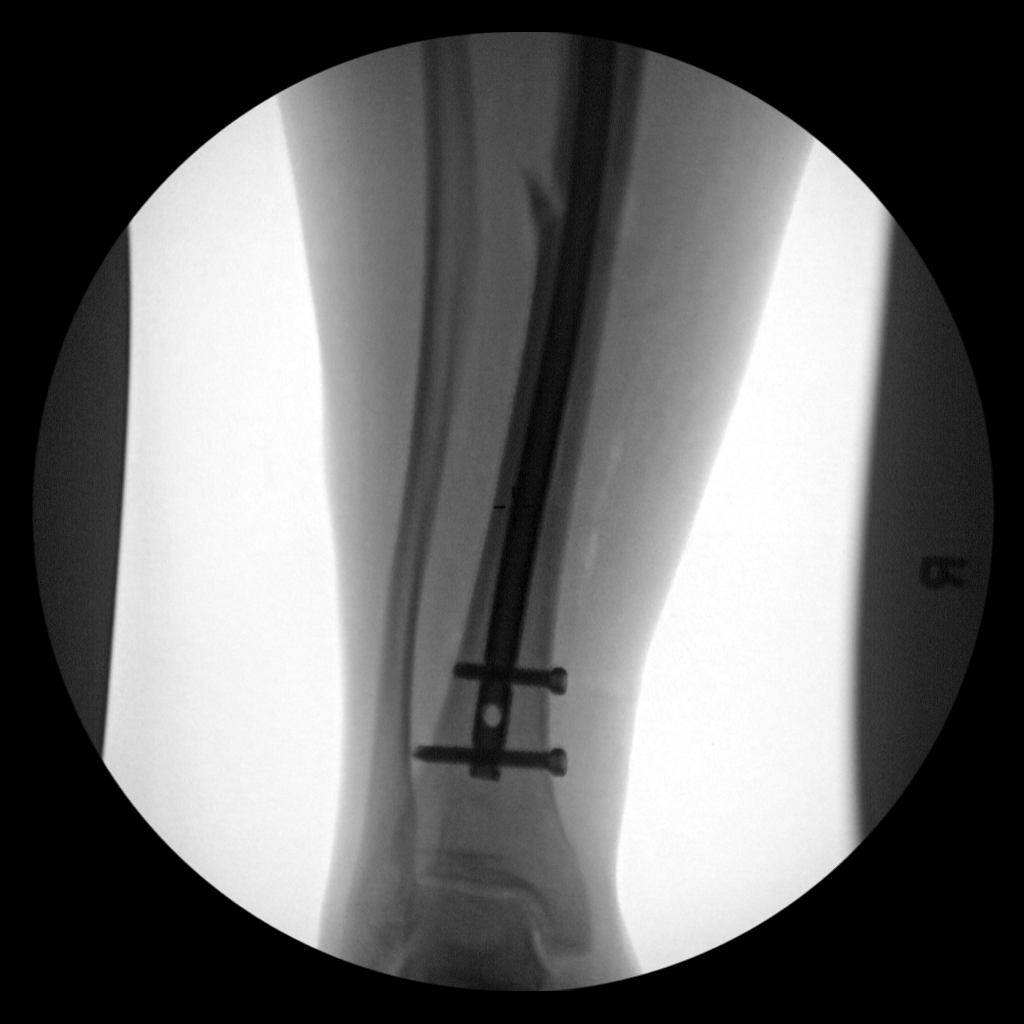
[im 2/5]
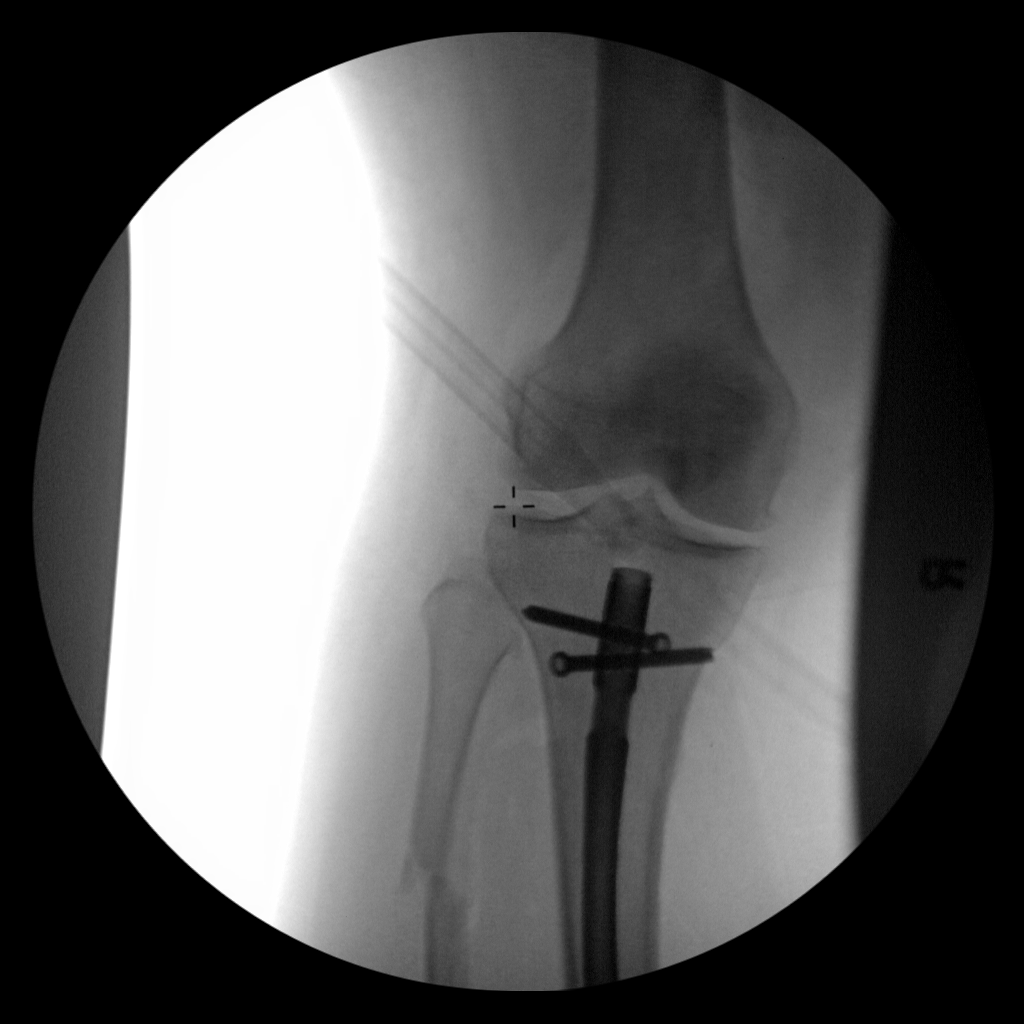
[im 3/5]
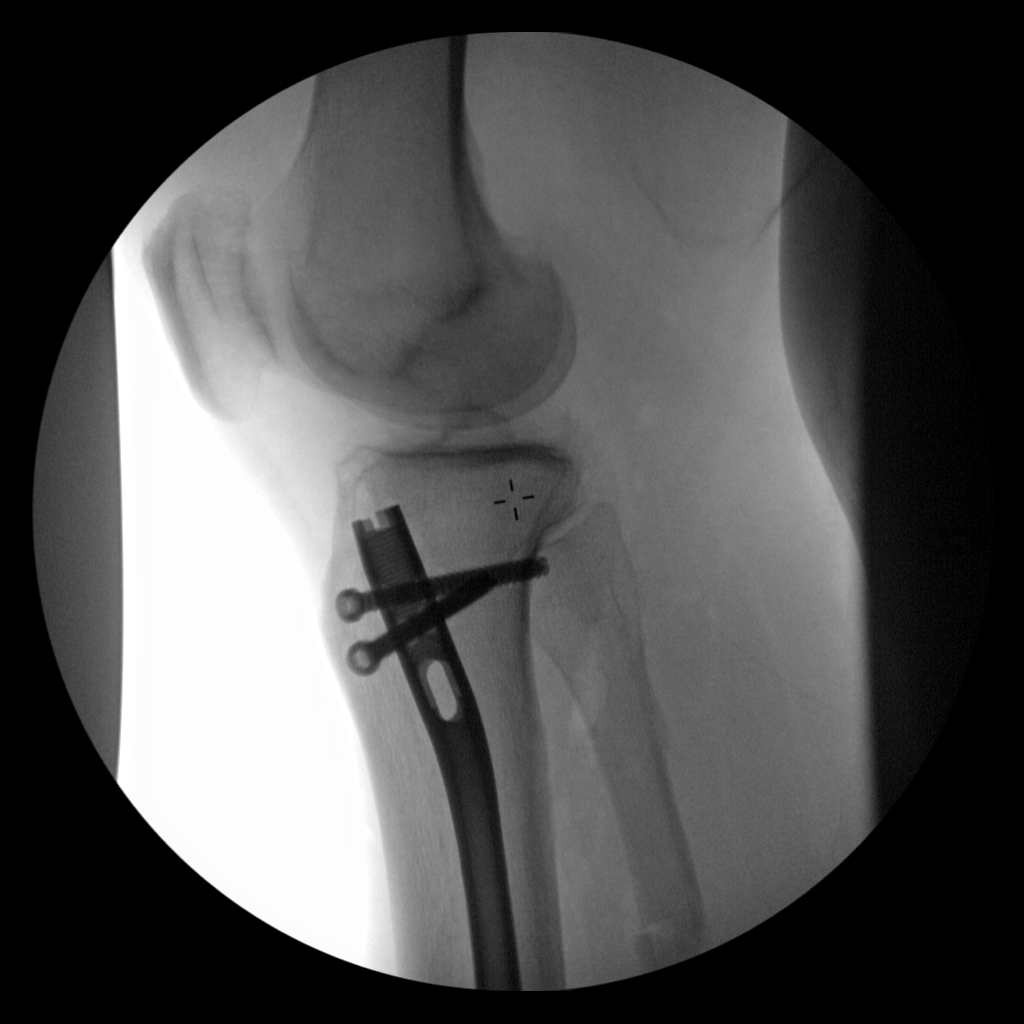
[im 4/5]
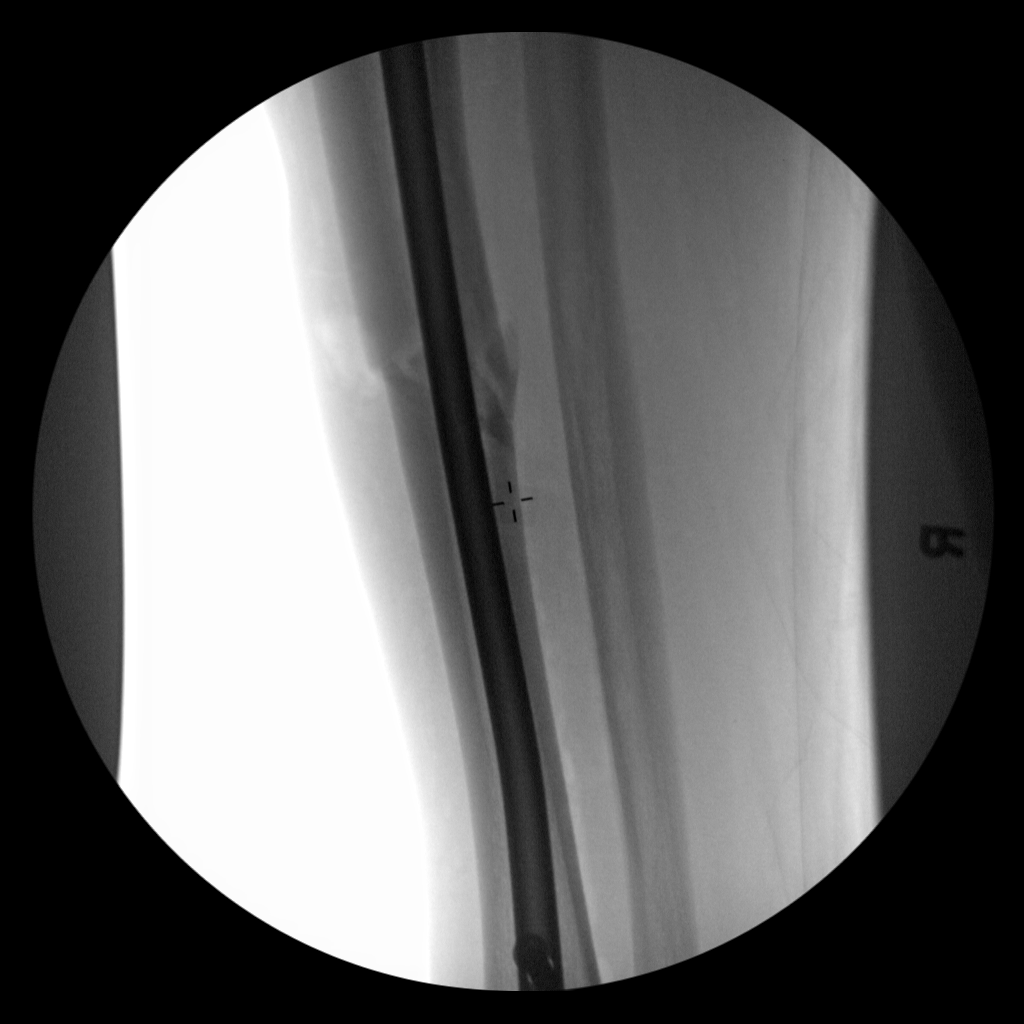
[im 5/5]
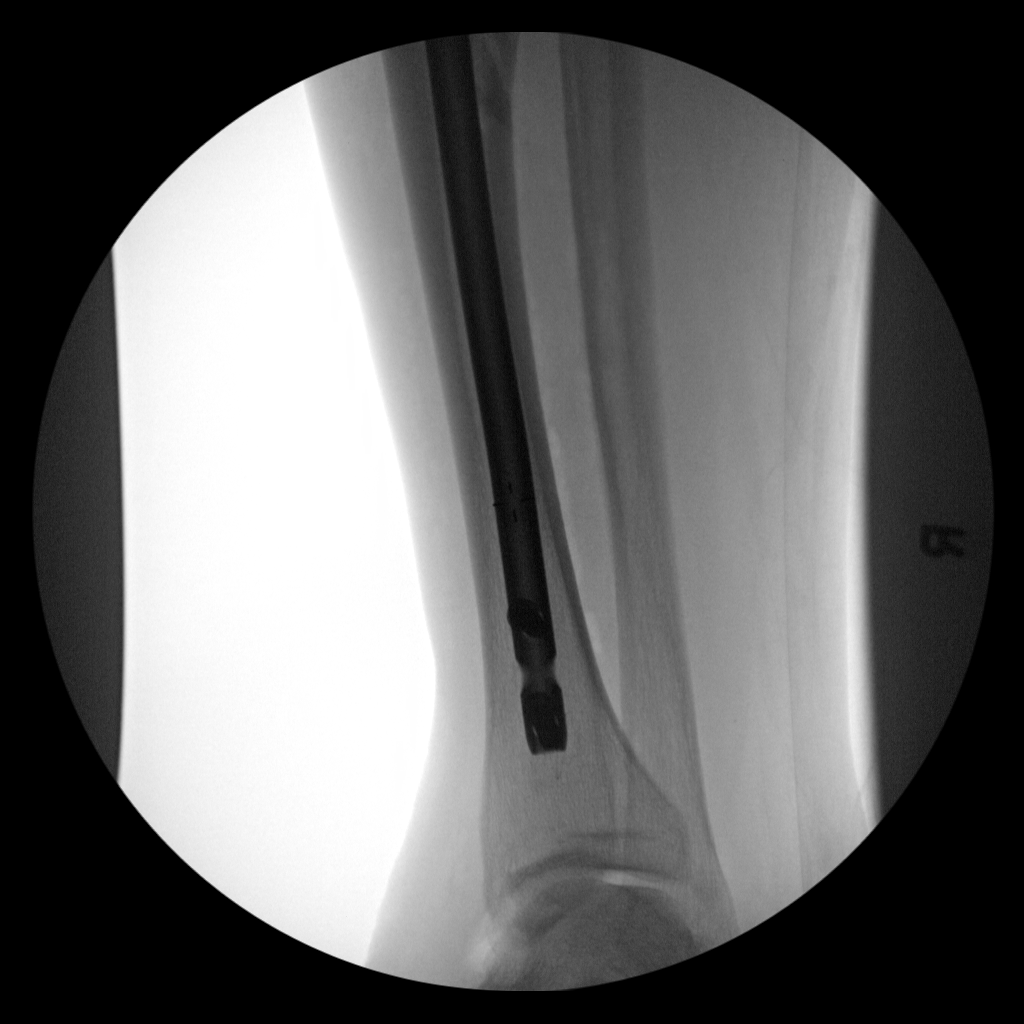

[5 of 5 positions shown; findings below may reference images not displayed]

FINDINGS: Open reduction internal fixation of the tibial fracture with
intramedullary rod. Satisfactory position and alignment.
IMPRESSION: As above.

## 2019-06-25 IMAGING — XA IR FISTULA/SINUS TRACT
2 series · 8 of 8 positions shown · non-contrast
Comparison: None.

INDICATION: Cholecystostomy tube check

EXAM:
SINUS TRACT INJECTION / FISTULOGRAM
TECHNIQUE: Informed written consent was obtained from the patient after a
thorough discussion of the procedural risks, benefits and
alternatives. All questions were addressed. Maximal Sterile Barrier
Technique was utilized including caps, mask, sterile gowns, sterile
gloves, sterile drape, hand hygiene and skin antiseptic. A timeout
was performed prior to the initiation of the procedure.
Contrast was injected into the cholecystostomy tube and imaging was
obtained.
PROCEDURE:
The cholecystostomy tube is well positioned within the lumen of the
gallbladder. Several gallstones are noted. The cystic duct partially
opacified.

[Series 1: fl (-) angio · 1 of 1 slices shown (1 of 2)]
[im 1/1]
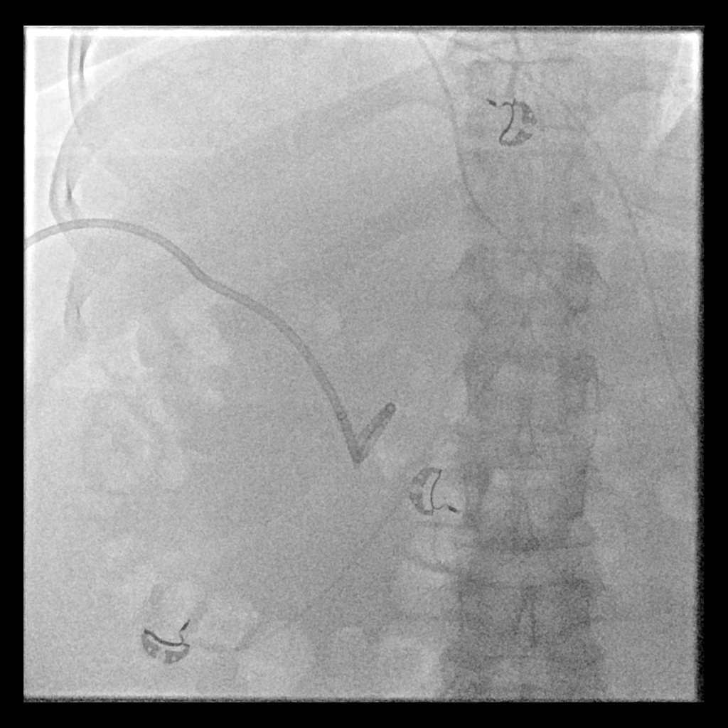

[Series 2: fl (-) angio · 7 of 7 slices shown (2 of 2)]
[im 1/7]
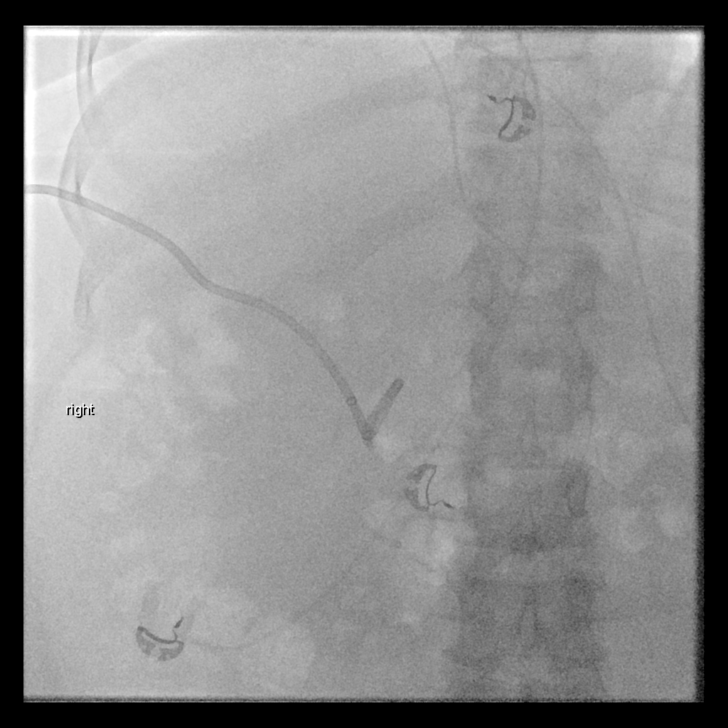
[im 2/7]
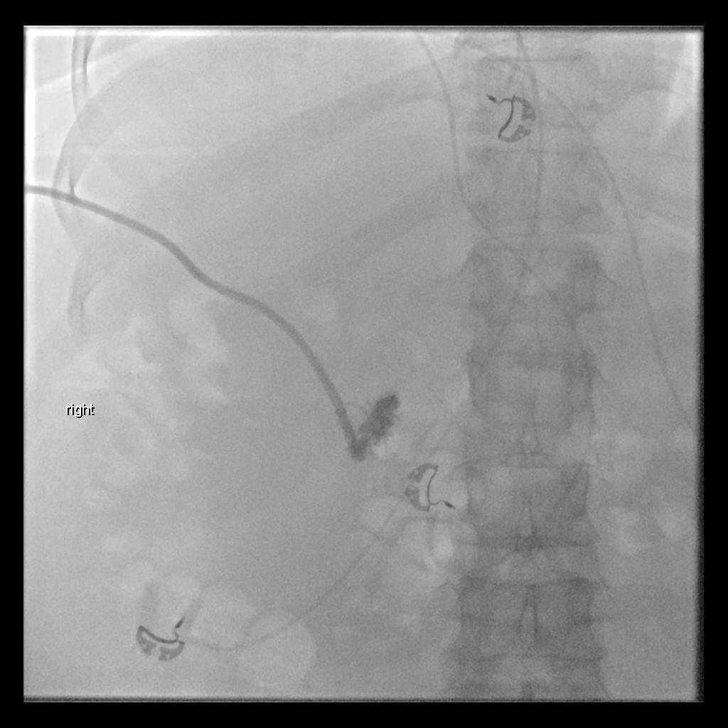
[im 3/7]
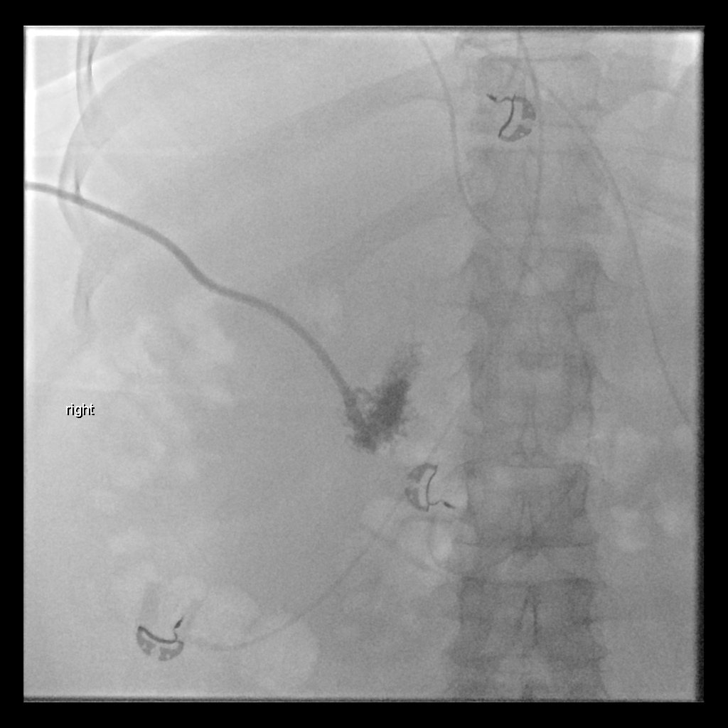
[im 4/7]
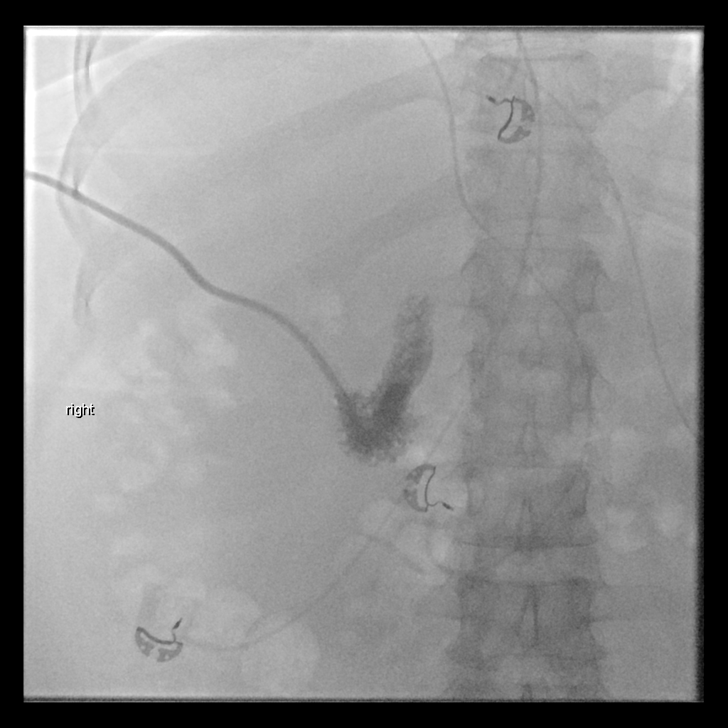
[im 5/7]
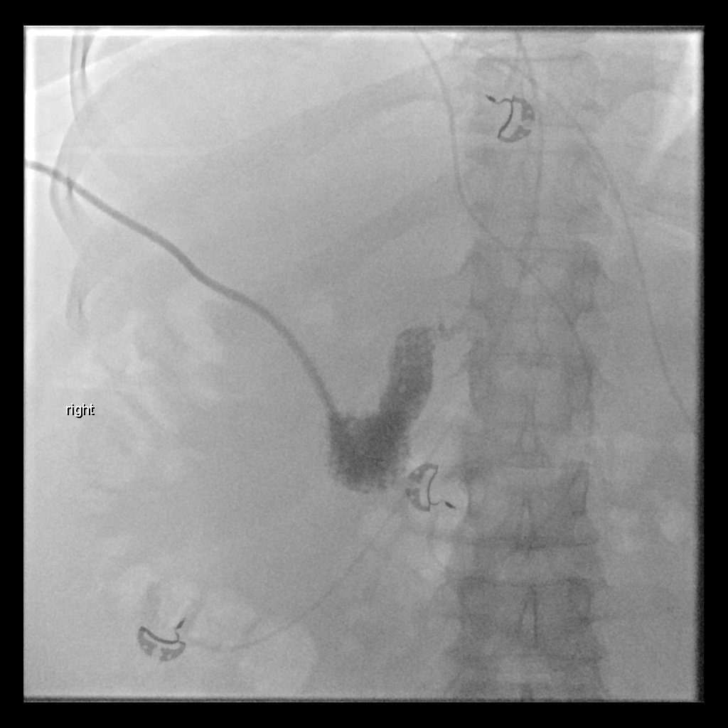
[im 6/7]
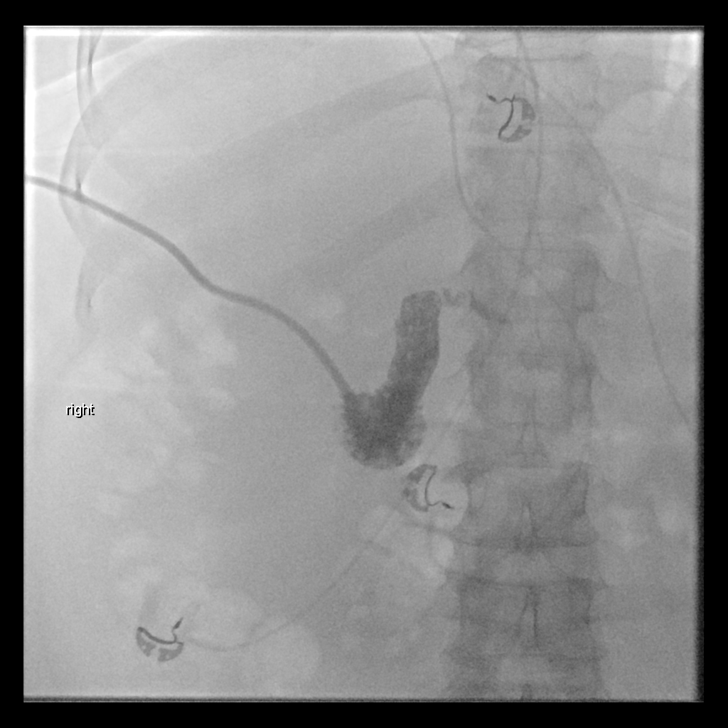
[im 7/7]
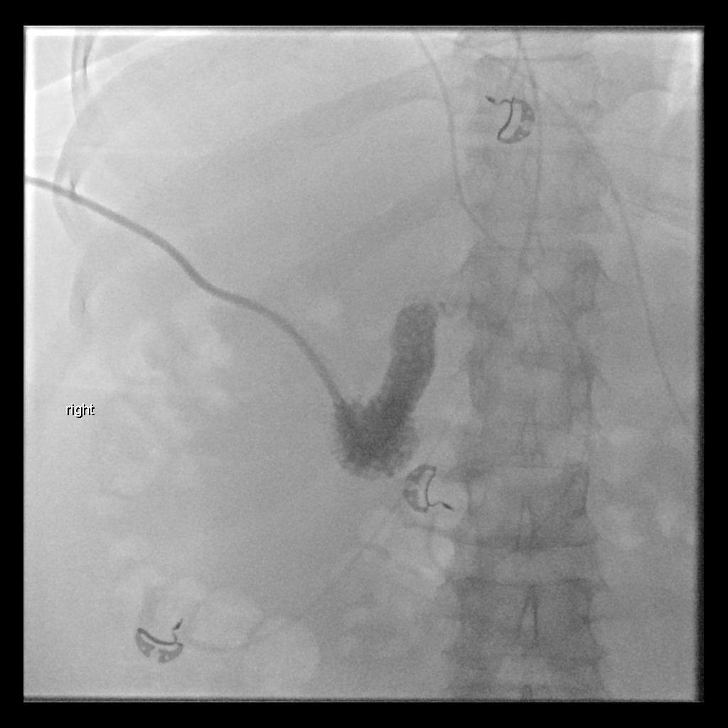

[8 of 8 positions shown; findings below may reference images not displayed]

MEDICATIONS:
The patient is currently admitted to the hospital and receiving
intravenous antibiotics. The antibiotics were administered within an
appropriate time frame prior to the initiation of the procedure.

ANESTHESIA/SEDATION:
None

COMPLICATIONS:
None immediate.
IMPRESSION: The cholecystostomy tube remains well positioned in the gallbladder.
It is functioning adequately.

Cholelithiasis.

## 2019-06-26 ENCOUNTER — Telehealth (HOSPITAL_COMMUNITY): Payer: Self-pay

## 2019-06-26 DIAGNOSIS — G2401 Drug induced subacute dyskinesia: Secondary | ICD-10-CM | POA: Diagnosis not present

## 2019-06-26 DIAGNOSIS — N179 Acute kidney failure, unspecified: Secondary | ICD-10-CM | POA: Diagnosis not present

## 2019-06-26 DIAGNOSIS — M109 Gout, unspecified: Secondary | ICD-10-CM | POA: Diagnosis not present

## 2019-06-26 DIAGNOSIS — Z79899 Other long term (current) drug therapy: Secondary | ICD-10-CM | POA: Diagnosis not present

## 2019-06-26 DIAGNOSIS — R2689 Other abnormalities of gait and mobility: Secondary | ICD-10-CM | POA: Diagnosis not present

## 2019-06-26 DIAGNOSIS — G4733 Obstructive sleep apnea (adult) (pediatric): Secondary | ICD-10-CM | POA: Diagnosis not present

## 2019-06-26 DIAGNOSIS — K8020 Calculus of gallbladder without cholecystitis without obstruction: Secondary | ICD-10-CM | POA: Diagnosis not present

## 2019-06-26 DIAGNOSIS — E119 Type 2 diabetes mellitus without complications: Secondary | ICD-10-CM | POA: Diagnosis not present

## 2019-06-26 DIAGNOSIS — K625 Hemorrhage of anus and rectum: Secondary | ICD-10-CM | POA: Diagnosis not present

## 2019-06-26 DIAGNOSIS — E785 Hyperlipidemia, unspecified: Secondary | ICD-10-CM | POA: Diagnosis not present

## 2019-06-26 DIAGNOSIS — Z87891 Personal history of nicotine dependence: Secondary | ICD-10-CM | POA: Diagnosis not present

## 2019-06-26 DIAGNOSIS — R1084 Generalized abdominal pain: Secondary | ICD-10-CM | POA: Diagnosis not present

## 2019-06-26 DIAGNOSIS — I10 Essential (primary) hypertension: Secondary | ICD-10-CM | POA: Diagnosis not present

## 2019-06-26 DIAGNOSIS — M1990 Unspecified osteoarthritis, unspecified site: Secondary | ICD-10-CM | POA: Diagnosis not present

## 2019-06-26 NOTE — Telephone Encounter (Signed)
As we just discussed we should wait as it appears she is still hospitalized.

## 2019-06-26 NOTE — Telephone Encounter (Signed)
Patient called and stated that she is no longer in [place], [place].  She stated that she's now in the hospital in [place], [place]. She also stated that she's  going to be released from the hospital and going to work with DSS to get set up in a independent living place. She requested her Valium 10mg  and her Vyvanse 70mg  be sent to [facility] on [address] on the corner of [address] in [place], [place]. I will call [facility] in [place], [place] to deactivate those scripts. Please review and advise. Thank you.

## 2019-06-27 DIAGNOSIS — K279 Peptic ulcer, site unspecified, unspecified as acute or chronic, without hemorrhage or perforation: Secondary | ICD-10-CM | POA: Diagnosis not present

## 2019-06-27 DIAGNOSIS — J45909 Unspecified asthma, uncomplicated: Secondary | ICD-10-CM | POA: Diagnosis not present

## 2019-06-27 DIAGNOSIS — N179 Acute kidney failure, unspecified: Secondary | ICD-10-CM | POA: Diagnosis not present

## 2019-06-27 DIAGNOSIS — R9431 Abnormal electrocardiogram [ECG] [EKG]: Secondary | ICD-10-CM | POA: Diagnosis not present

## 2019-06-27 DIAGNOSIS — K219 Gastro-esophageal reflux disease without esophagitis: Secondary | ICD-10-CM | POA: Diagnosis not present

## 2019-06-27 DIAGNOSIS — Z0489 Encounter for examination and observation for other specified reasons: Secondary | ICD-10-CM | POA: Diagnosis not present

## 2019-06-27 IMAGING — DX DG CHEST 1V PORT
1 series · 1 of 1 positions shown · non-contrast
Comparison: 07/09/2016

CLINICAL DATA: Dyspnea and fever

EXAM:
PORTABLE CHEST 1 VIEW

[chest ap]
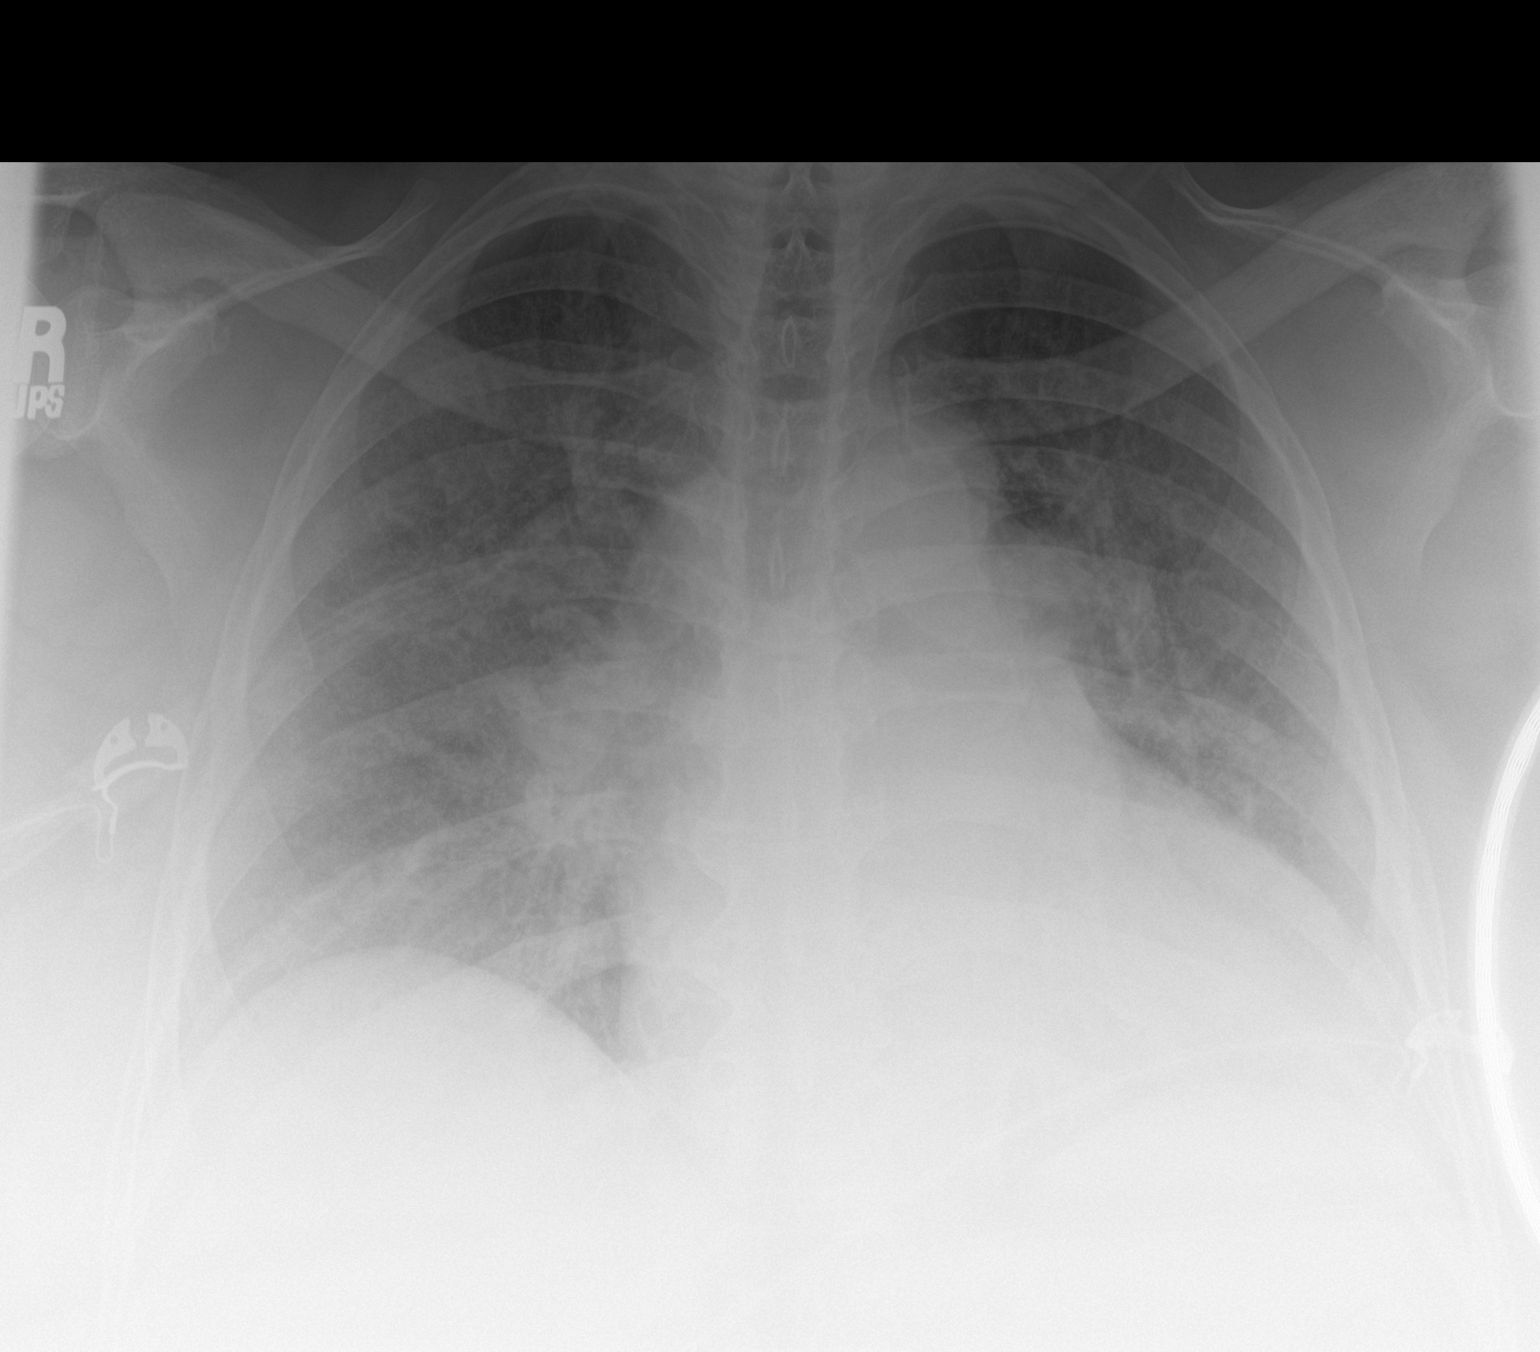

[1 of 1 positions shown; findings below may reference images not displayed]

FINDINGS: Vascular congestion is worse. Low volumes with vascular crowding and
bibasilar atelectasis. Upper lungs clear. Central pulmonary edema is
suspected. No pneumothorax pericardium.
IMPRESSION: Above findings are most consistent with mild CHF and volume
overload.

## 2019-06-27 IMAGING — CT CT ANGIO CHEST
2 of 8 series · 18 of 46 positions shown · IV contrast (OMNI)
Comparison: Chest x-ray dated 07/15/2014 and chest CT dated
08/22/2015

CLINICAL DATA: Shortness of breath today.

EXAM:
CT ANGIOGRAPHY CHEST WITH CONTRAST
TECHNIQUE: Multidetector CT imaging of the chest was performed using the
standard protocol during bolus administration of intravenous
contrast. Multiplanar CT image reconstructions and MIPs were
obtained to evaluate the vascular anatomy.
CONTRAST:  80 cc Isovue 370

[Series 6: thins · axial · 0.70mm/px · z∈[+1270,+1514]mm · 15 of 269 slices shown]
[im 13/269  lung]
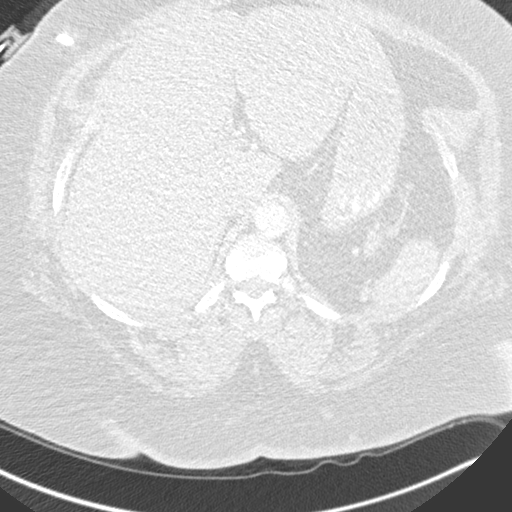
[im 37/269  soft-tissue]
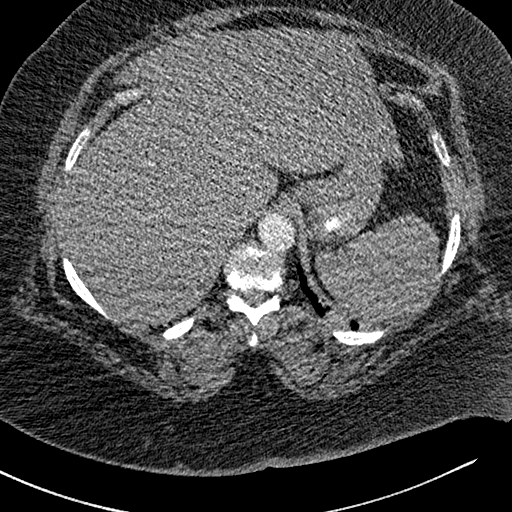
[im 49/269  lung]
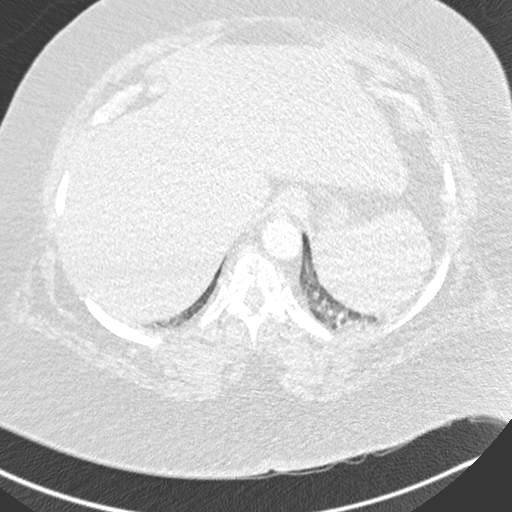
[im 61/269  soft-tissue]
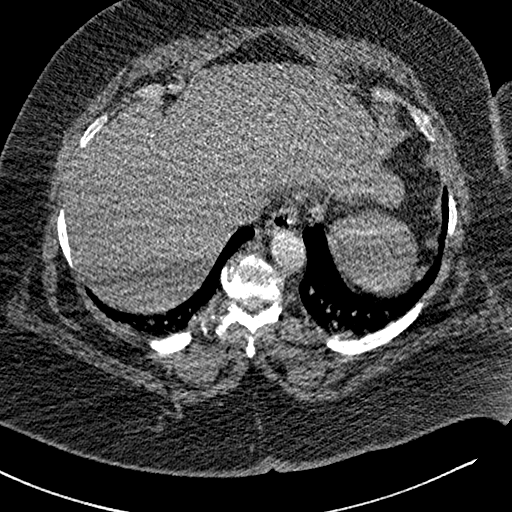
[im 86/269  lung]
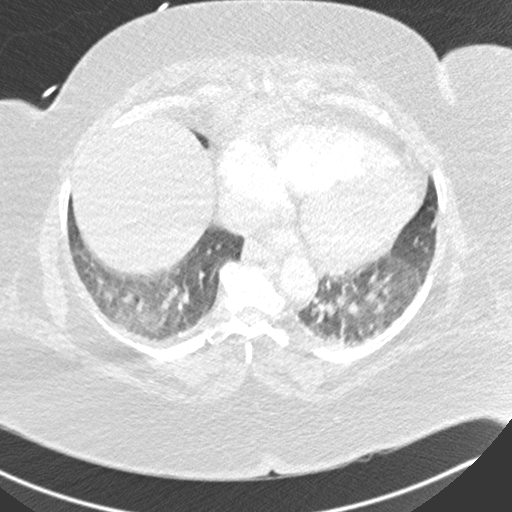
[im 98/269  soft-tissue]
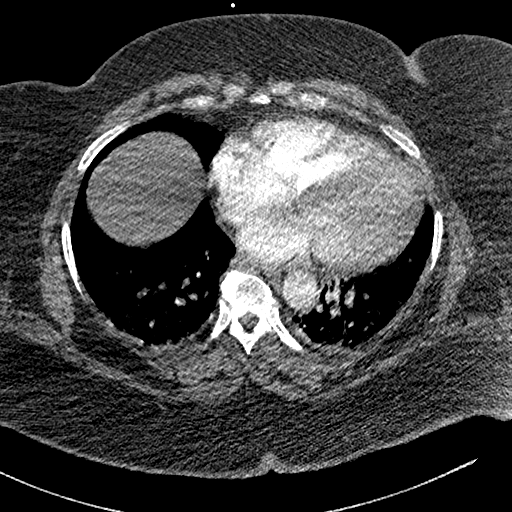
[im 122/269  lung]
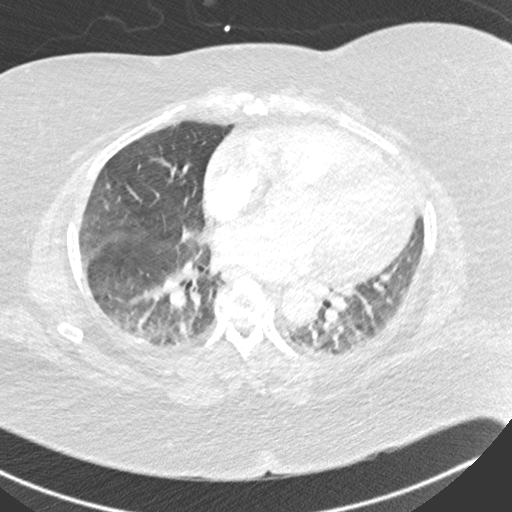
[im 135/269  soft-tissue]
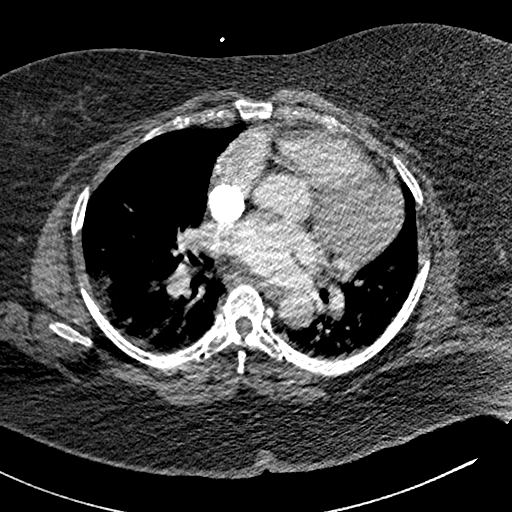
[im 147/269  lung]
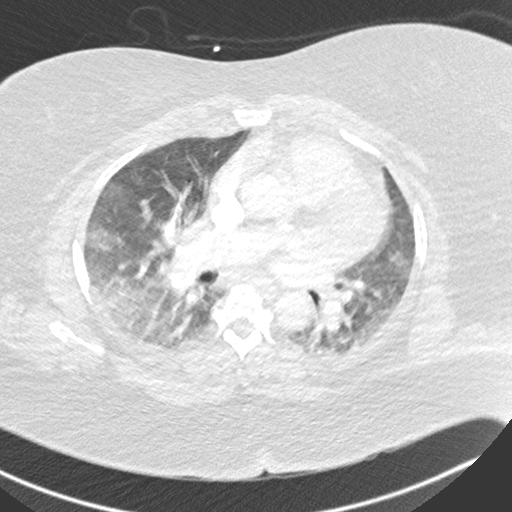
[im 171/269  soft-tissue]
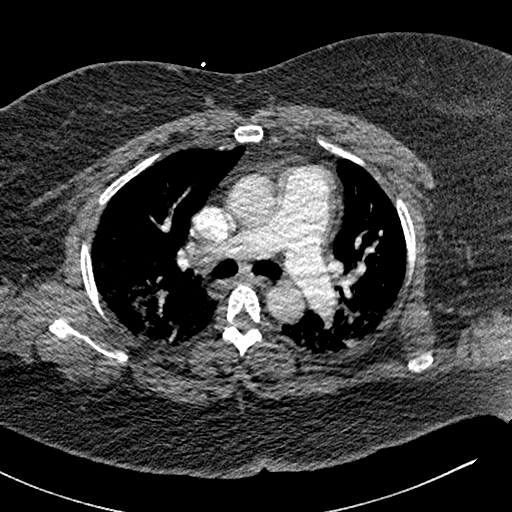
[im 183/269  lung]
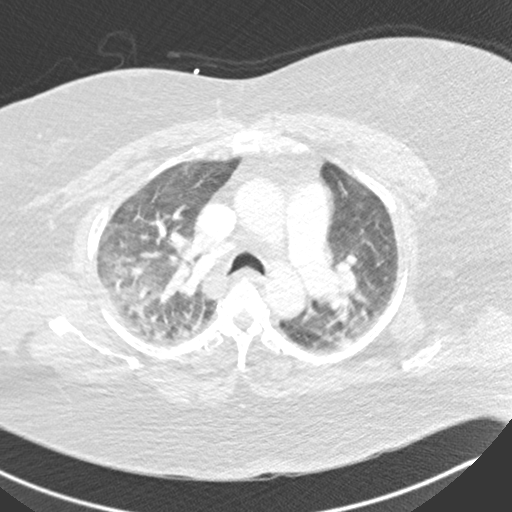
[im 208/269  soft-tissue]
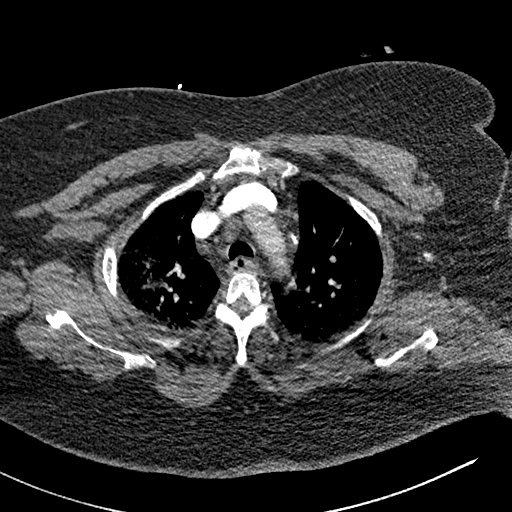
[im 220/269  lung]
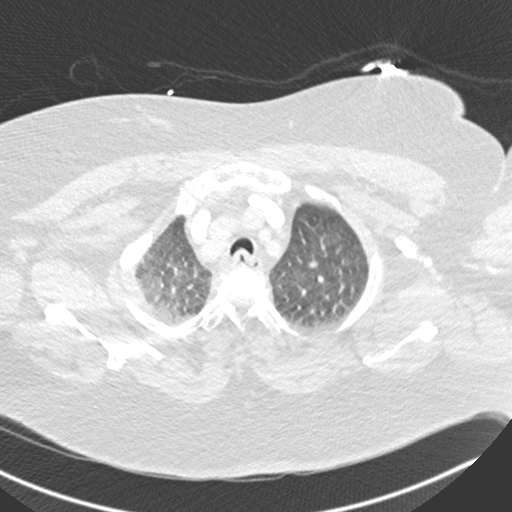
[im 232/269  soft-tissue]
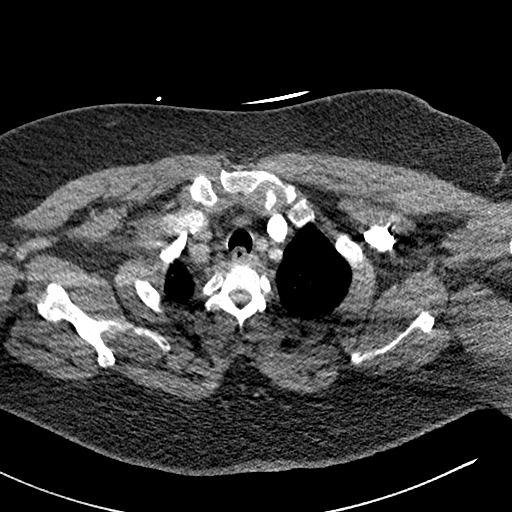
[im 256/269  lung]
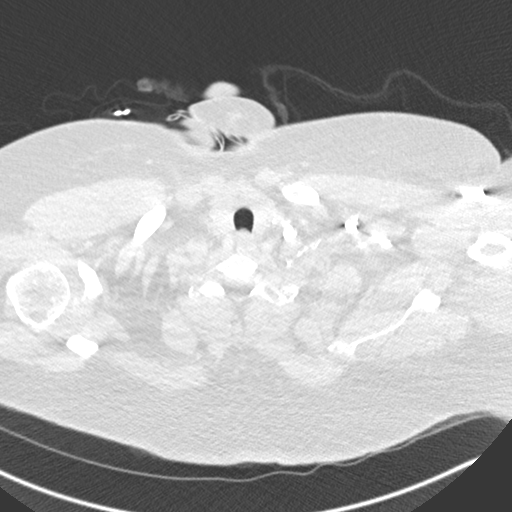

[Series 8: coronal mpr · coronal · 0.53mm/px · 3 of 141 slices shown]
[im 36/141  soft-tissue]
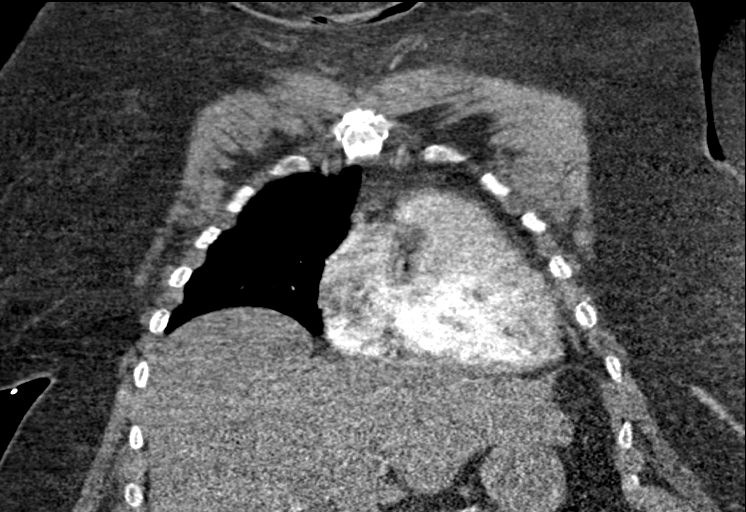
[im 71/141  soft-tissue]
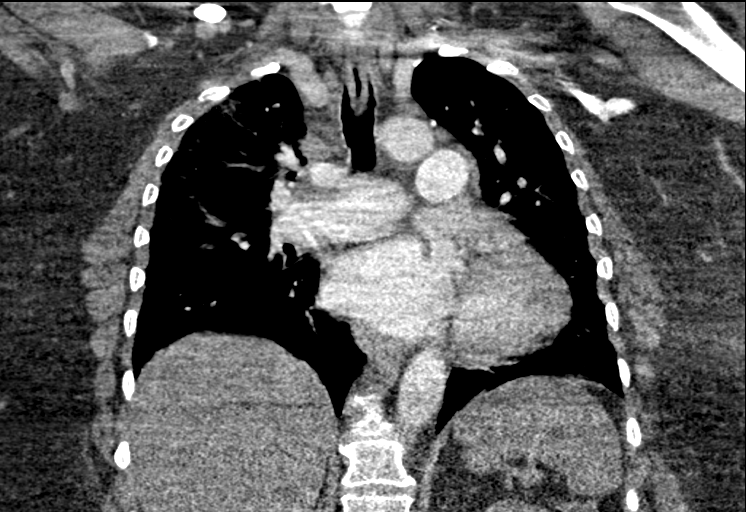
[im 106/141  soft-tissue]
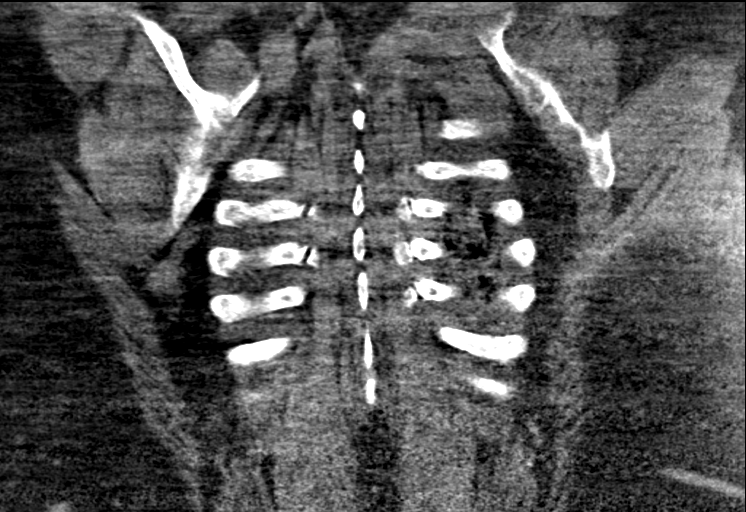

[18 of 46 positions shown; findings below may reference images not displayed]

FINDINGS: Cardiovascular: Satisfactory opacification of the pulmonary arteries
to the segmental level. No evidence of pulmonary embolism.
Cardiomegaly. No pericardial effusion.

Mediastinum/Nodes: No enlarged mediastinal, hilar, or axillary lymph
nodes. Thyroid gland, trachea, and esophagus demonstrate no
significant findings.

Lungs/Pleura: Hazy infiltrates in both upper lobes, particularly on
the right and to a lesser degree in both lower lobes. This could
represent pneumonitis or pulmonary edema. There are no pleural
effusions however.

Upper Abdomen: Normal.

Musculoskeletal: No chest wall abnormality. No acute or significant
osseous findings.

Review of the MIP images confirms the above findings.
IMPRESSION: 1. No pulmonary emboli.
2. Hazy infiltrates in both lungs, most likely representing mild
pulmonary edema. Is the patient febrile? No pleural effusions.
3. Cardiomegaly.

## 2019-06-30 DIAGNOSIS — K279 Peptic ulcer, site unspecified, unspecified as acute or chronic, without hemorrhage or perforation: Secondary | ICD-10-CM | POA: Diagnosis not present

## 2019-06-30 DIAGNOSIS — N179 Acute kidney failure, unspecified: Secondary | ICD-10-CM | POA: Diagnosis not present

## 2019-06-30 DIAGNOSIS — J45909 Unspecified asthma, uncomplicated: Secondary | ICD-10-CM | POA: Diagnosis not present

## 2019-06-30 DIAGNOSIS — K219 Gastro-esophageal reflux disease without esophagitis: Secondary | ICD-10-CM | POA: Diagnosis not present

## 2019-07-03 IMAGING — XA IR CHOLANGIOGRAM VIA EXIST CATHETER
4 series · 4 of 4 positions shown · non-contrast
Comparison: Ultrasound fluoroscopic guided cholecystostomy tube
placement - 05/10/2016;

INDICATION: History of acute cholecystitis, post ultrasound fluoroscopic guided
cholecystostomy tube placement 05/10/2016, now with concern for
cholecystostomy tube malpositioning.

[Series 1: fl (-) angio · 1 of 1 slices shown (1 of 4)]
[im 1/1]
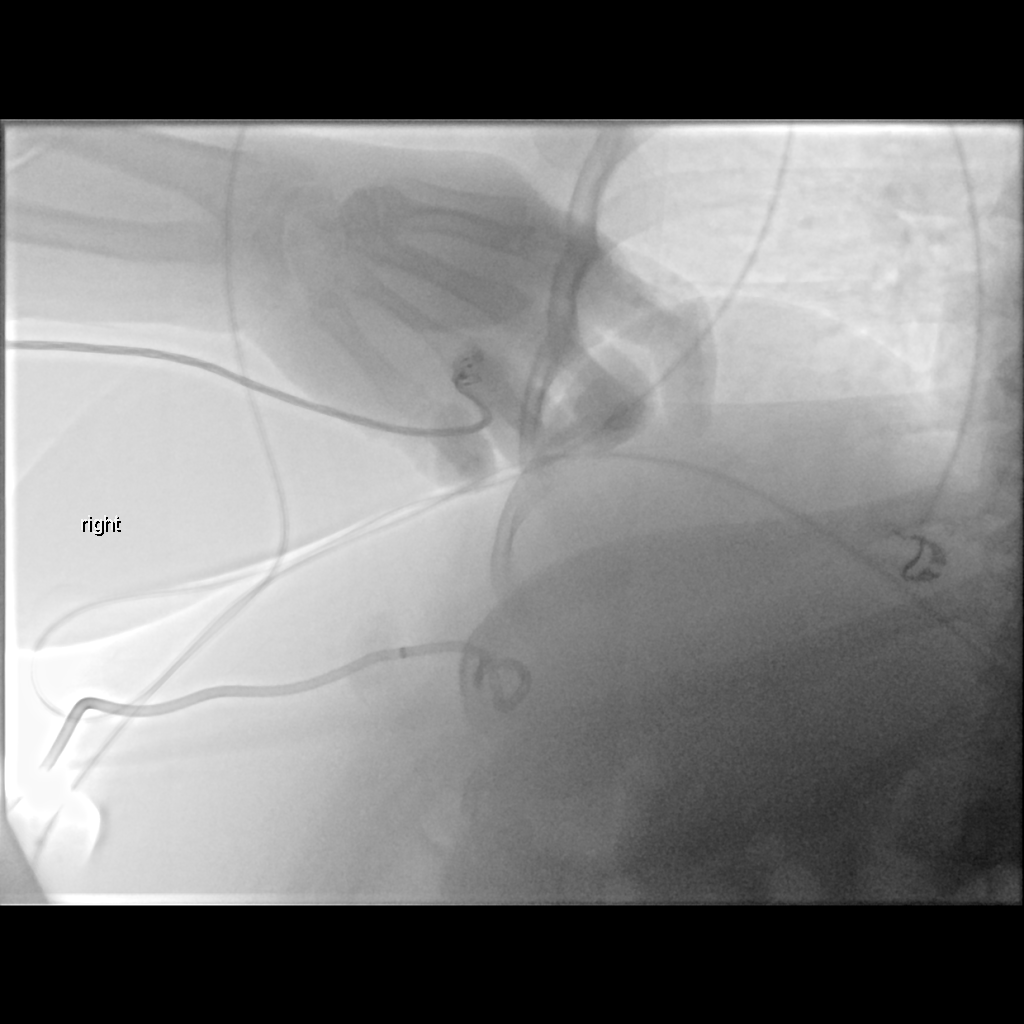

[Series 2: fl (-) angio · 1 of 1 slices shown (2 of 4)]
[im 1/1]
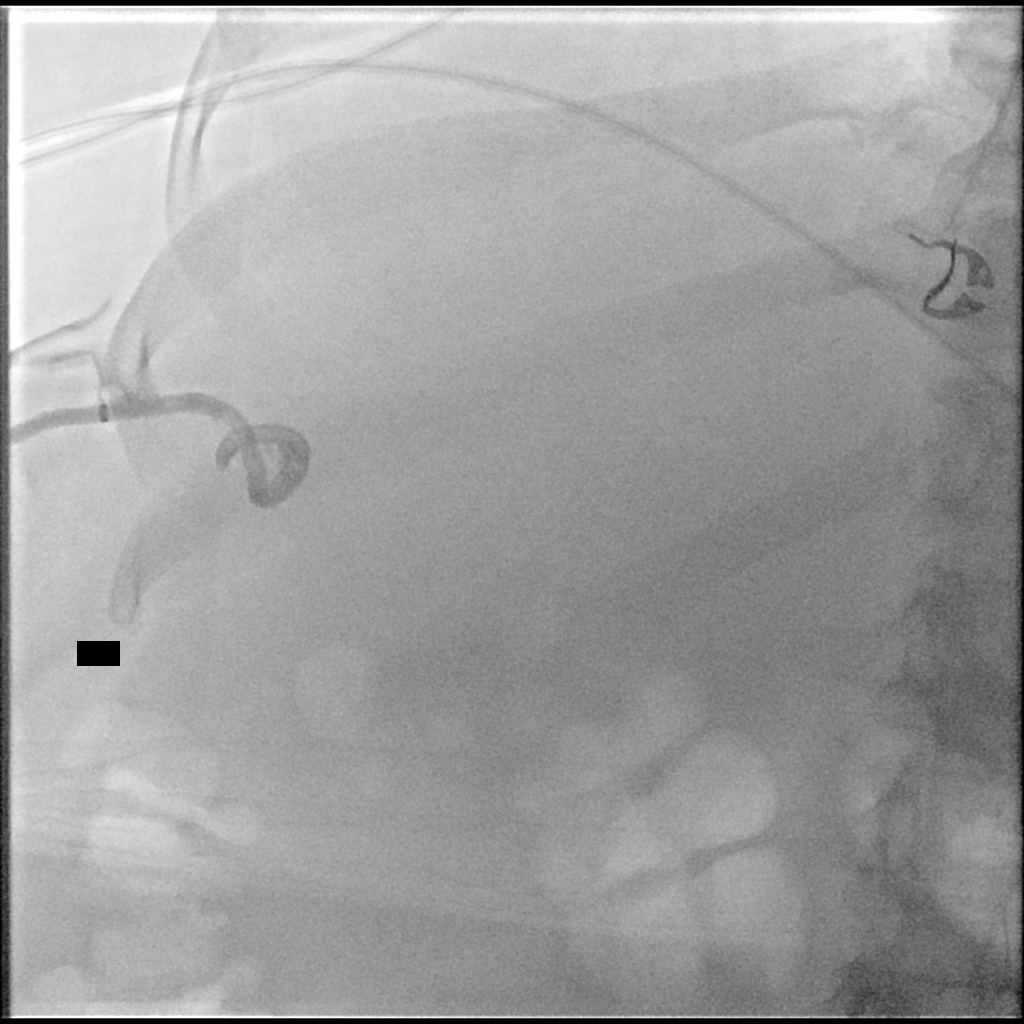

[Series 3: fl (-) angio · 1 of 1 slices shown (3 of 4)]
[im 1/1]
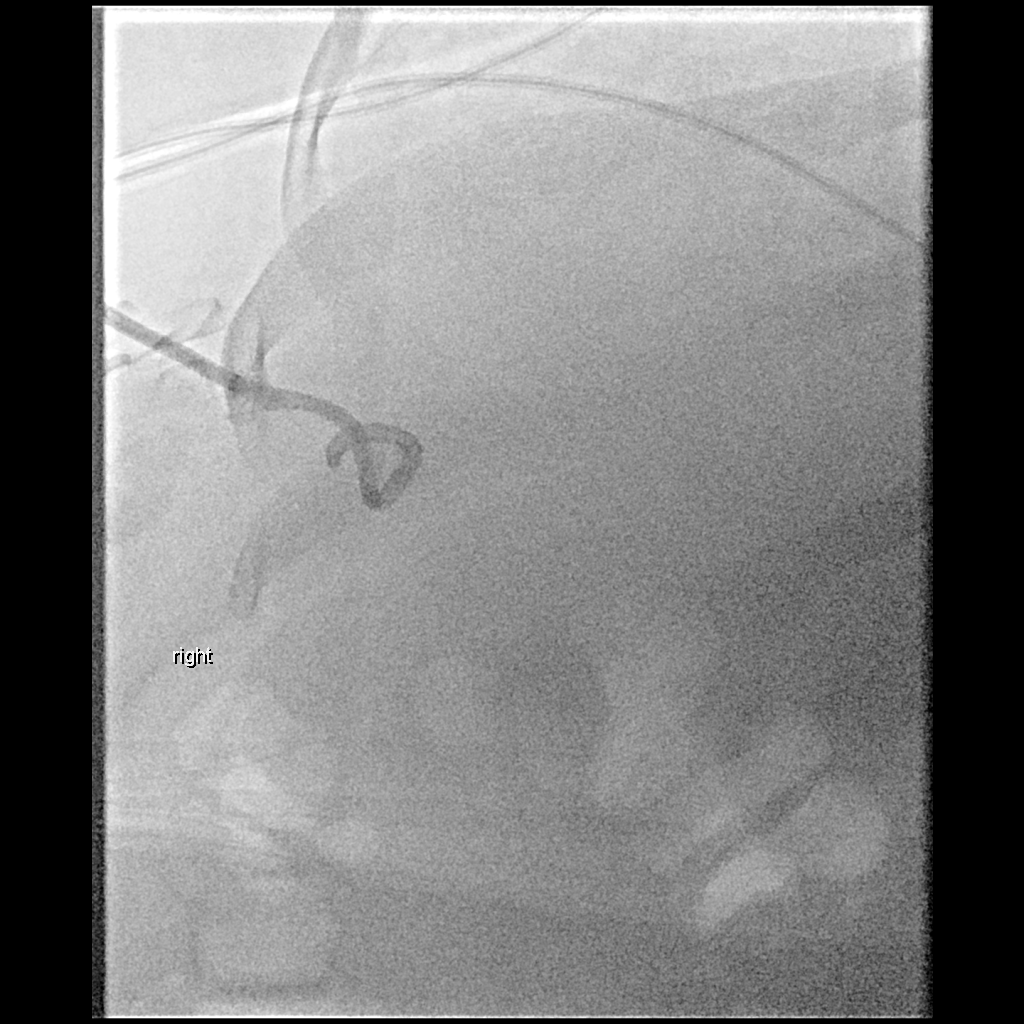

[Series 4: fl (-) angio · 1 of 1 slices shown (4 of 4)]
[im 1/1]
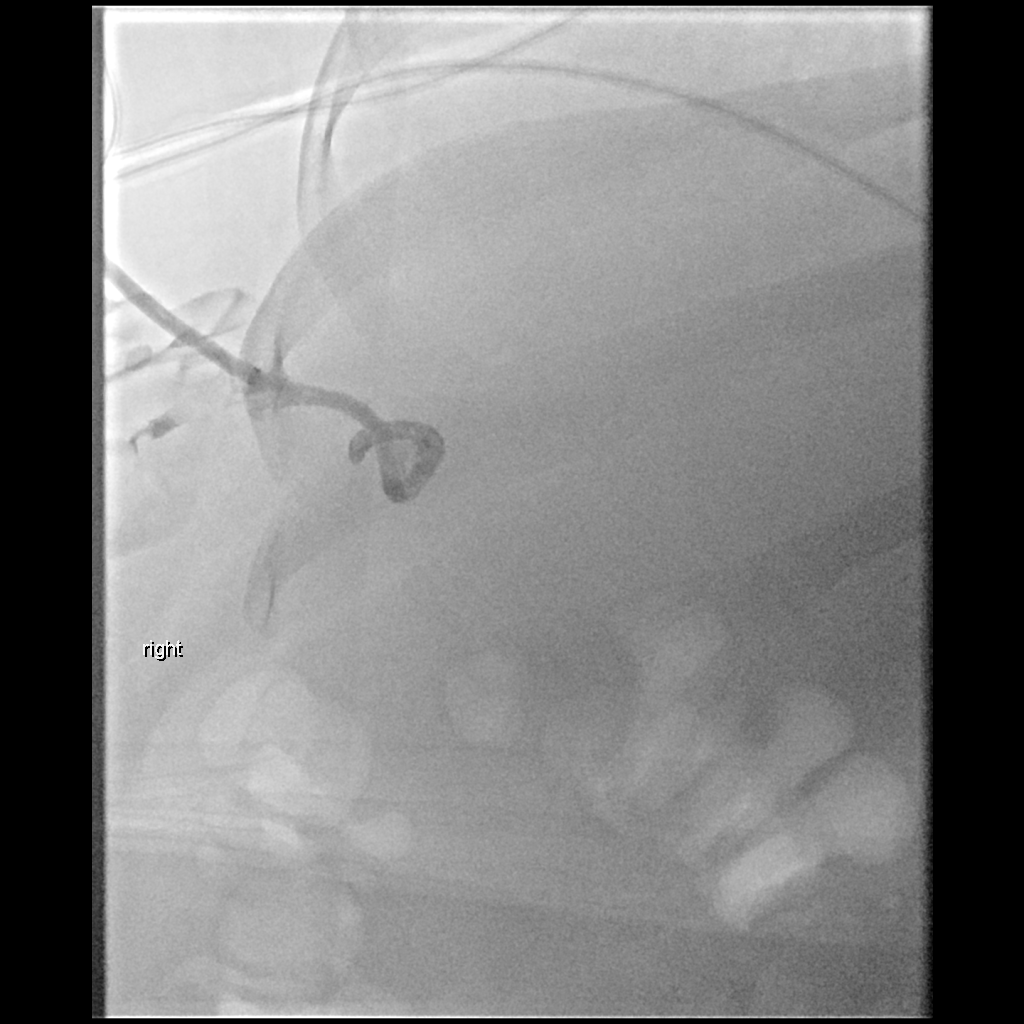

[4 of 4 positions shown; findings below may reference images not displayed]

Patient was initially concerned about cholecystostomy to
malpositioning at the end of last month however fluoroscopic guided
cholangiogram via the existing cholecystostomy tube performed
07/12/2016 demonstrated appropriate positioning and functionality of
the cholecystostomy tube with extensive cholelithiasis.

Unfortunately, approximately 4 days ago, the patient noticed
decreased output from the cholecystostomy tube as well as worsening
right upper quadrant abdominal pain and as such, returns today for
repeat percutaneous cholangiogram.

Patient states that her right upper quadrant abdominal pain does NOT
match her preprocedural cholecystostomy tube placement abdominal
pain (which she states was located within her right mid/lower
abdomen), rather, she attributes her pain to the cholecystostomy
tube itself.

EXAM:
FLUOROSCOPIC GUIDED CHOLECYSTOSTOMY TUBE INJECTION
cholangiogram via existing cholecystostomy -
07/12/2016

MEDICATIONS:
None

ANESTHESIA/SEDATION:
None

CONTRAST:  10mL [id number] IOPAMIDOL ([id number]) INJECTION 61% -
administered into the gallbladder fossa.

FLUOROSCOPY TIME:  30 seconds (25 mGy)

COMPLICATIONS:
None immediate.

PROCEDURE:
The patient was positioned supine on the fluoroscopy table.

A preprocedural spot fluoroscopic image was obtained of the right
upper abdominal quadrant existing cholecystostomy tube.

Multiple spot fluoroscopic images were obtained of the right upper
abdominal quadrant an existing cholecystostomy tube following
injection of a small amount of contrast. Images were reviewed and
discussed with the patient.

At this point, the existing malposition cholecystostomy tube was cut
and removed intact. A dressing was placed. The patient tolerated the
procedure well without immediate postprocedural complication.
FINDINGS: Preprocedural spot fluoroscopic image of the right upper abdominal
quadrant demonstrates obvious malpositioning of the cholecystostomy
tube with retraction to location of the subcutaneous tissues
regional to the entrance site of the cholecystostomy tube.

Contrast injection confirmed malposition of the cholecystostomy tube
with reflux of injected contrast along the catheter tract to the
entrance site at the skin surface.

There is no opacification of the prior cholecystostomy tube track or
the gallbladder.
IMPRESSION: 1. Malpositioned cholecystostomy tube. Contrast injection failed to
delineate opacification of the prior cholecystostomy tube track or
the gallbladder.
2. Successful bedside removal of the cholecystostomy tube.

PLAN:
Based on detailed questioning, I do not believe the patient has
experienced recurrent biliary obstructive symptoms, rather, I
believe her residual right upper quadrant abdominal pain is
attributable to the malpositioned cholecystostomy tube.

As such, I did not feel repeat cholecystostomy tube placement is
necessary at this time.

As she is known extensive cholelithiasis, she is at risk for
recurrent acute cholecystitis however repeat cholecystostomy tube
placement (versus definitive cholecystectomy) will be deferred until
that time.

Patient demonstrates fair understanding of the above conversation.

Above findings discussed with Dr. Candra Ridha at [DATE].

## 2019-07-07 ENCOUNTER — Telehealth (HOSPITAL_COMMUNITY): Payer: Medicaid Other | Admitting: Psychiatry

## 2019-07-07 ENCOUNTER — Other Ambulatory Visit: Payer: Self-pay

## 2019-07-07 ENCOUNTER — Other Ambulatory Visit: Payer: Self-pay | Admitting: Internal Medicine

## 2019-07-07 DIAGNOSIS — M109 Gout, unspecified: Secondary | ICD-10-CM

## 2019-07-07 MED ORDER — COLCRYS 0.6 MG PO TABS: 0.6000 mg | ORAL_TABLET | Freq: Two times a day (BID) | ORAL | 0 refills | Status: DC

## 2019-07-11 ENCOUNTER — Telehealth (HOSPITAL_COMMUNITY): Payer: Medicaid Other | Admitting: Psychiatry

## 2019-07-16 DIAGNOSIS — R52 Pain, unspecified: Secondary | ICD-10-CM | POA: Diagnosis not present

## 2019-07-16 DIAGNOSIS — Z743 Need for continuous supervision: Secondary | ICD-10-CM | POA: Diagnosis not present

## 2019-07-20 DIAGNOSIS — J449 Chronic obstructive pulmonary disease, unspecified: Secondary | ICD-10-CM | POA: Diagnosis not present

## 2019-07-22 DIAGNOSIS — R062 Wheezing: Secondary | ICD-10-CM | POA: Diagnosis not present

## 2019-07-22 DIAGNOSIS — M6281 Muscle weakness (generalized): Secondary | ICD-10-CM | POA: Diagnosis not present

## 2019-07-22 DIAGNOSIS — J449 Chronic obstructive pulmonary disease, unspecified: Secondary | ICD-10-CM | POA: Diagnosis not present

## 2019-07-22 DIAGNOSIS — J45909 Unspecified asthma, uncomplicated: Secondary | ICD-10-CM | POA: Diagnosis not present

## 2019-07-24 DIAGNOSIS — J449 Chronic obstructive pulmonary disease, unspecified: Secondary | ICD-10-CM | POA: Diagnosis not present

## 2019-07-25 ENCOUNTER — Telehealth (HOSPITAL_COMMUNITY): Payer: Medicare Other | Admitting: Psychiatry

## 2019-07-25 ENCOUNTER — Other Ambulatory Visit: Payer: Self-pay

## 2019-07-25 DIAGNOSIS — M1A09X0 Idiopathic chronic gout, multiple sites, without tophus (tophi): Secondary | ICD-10-CM | POA: Diagnosis not present

## 2019-07-25 DIAGNOSIS — N3281 Overactive bladder: Secondary | ICD-10-CM | POA: Diagnosis not present

## 2019-07-25 DIAGNOSIS — R062 Wheezing: Secondary | ICD-10-CM | POA: Diagnosis not present

## 2019-07-25 DIAGNOSIS — J45909 Unspecified asthma, uncomplicated: Secondary | ICD-10-CM | POA: Diagnosis not present

## 2019-07-25 DIAGNOSIS — G4733 Obstructive sleep apnea (adult) (pediatric): Secondary | ICD-10-CM | POA: Diagnosis not present

## 2019-07-27 DIAGNOSIS — Z743 Need for continuous supervision: Secondary | ICD-10-CM | POA: Diagnosis not present

## 2019-07-27 DIAGNOSIS — M25561 Pain in right knee: Secondary | ICD-10-CM | POA: Diagnosis not present

## 2019-07-27 DIAGNOSIS — E1122 Type 2 diabetes mellitus with diabetic chronic kidney disease: Secondary | ICD-10-CM | POA: Diagnosis not present

## 2019-07-27 DIAGNOSIS — E785 Hyperlipidemia, unspecified: Secondary | ICD-10-CM | POA: Diagnosis not present

## 2019-07-27 DIAGNOSIS — M25552 Pain in left hip: Secondary | ICD-10-CM | POA: Diagnosis not present

## 2019-07-27 DIAGNOSIS — M25562 Pain in left knee: Secondary | ICD-10-CM | POA: Diagnosis not present

## 2019-07-27 DIAGNOSIS — M25551 Pain in right hip: Secondary | ICD-10-CM | POA: Diagnosis not present

## 2019-07-27 DIAGNOSIS — I129 Hypertensive chronic kidney disease with stage 1 through stage 4 chronic kidney disease, or unspecified chronic kidney disease: Secondary | ICD-10-CM | POA: Diagnosis not present

## 2019-07-27 DIAGNOSIS — R52 Pain, unspecified: Secondary | ICD-10-CM | POA: Diagnosis not present

## 2019-07-27 DIAGNOSIS — N189 Chronic kidney disease, unspecified: Secondary | ICD-10-CM | POA: Diagnosis not present

## 2019-07-27 DIAGNOSIS — G8929 Other chronic pain: Secondary | ICD-10-CM | POA: Diagnosis not present

## 2019-07-28 ENCOUNTER — Telehealth (HOSPITAL_COMMUNITY): Payer: Medicare Other | Admitting: Psychiatry

## 2019-07-28 ENCOUNTER — Other Ambulatory Visit: Payer: Self-pay

## 2019-07-29 ENCOUNTER — Ambulatory Visit: Payer: Medicare Other | Admitting: Internal Medicine

## 2019-07-31 DIAGNOSIS — N1832 Chronic kidney disease, stage 3b: Secondary | ICD-10-CM | POA: Diagnosis not present

## 2019-08-02 DIAGNOSIS — M1990 Unspecified osteoarthritis, unspecified site: Secondary | ICD-10-CM | POA: Diagnosis not present

## 2019-08-02 DIAGNOSIS — I130 Hypertensive heart and chronic kidney disease with heart failure and stage 1 through stage 4 chronic kidney disease, or unspecified chronic kidney disease: Secondary | ICD-10-CM | POA: Diagnosis not present

## 2019-08-02 DIAGNOSIS — Z7952 Long term (current) use of systemic steroids: Secondary | ICD-10-CM | POA: Diagnosis not present

## 2019-08-02 DIAGNOSIS — K219 Gastro-esophageal reflux disease without esophagitis: Secondary | ICD-10-CM | POA: Diagnosis not present

## 2019-08-02 DIAGNOSIS — M79604 Pain in right leg: Secondary | ICD-10-CM | POA: Diagnosis not present

## 2019-08-02 DIAGNOSIS — Z7902 Long term (current) use of antithrombotics/antiplatelets: Secondary | ICD-10-CM | POA: Diagnosis not present

## 2019-08-02 DIAGNOSIS — E1169 Type 2 diabetes mellitus with other specified complication: Secondary | ICD-10-CM | POA: Diagnosis not present

## 2019-08-02 DIAGNOSIS — Z79899 Other long term (current) drug therapy: Secondary | ICD-10-CM | POA: Diagnosis not present

## 2019-08-02 DIAGNOSIS — M79642 Pain in left hand: Secondary | ICD-10-CM | POA: Diagnosis not present

## 2019-08-02 DIAGNOSIS — Z590 Homelessness: Secondary | ICD-10-CM | POA: Diagnosis not present

## 2019-08-02 DIAGNOSIS — R52 Pain, unspecified: Secondary | ICD-10-CM | POA: Diagnosis not present

## 2019-08-02 DIAGNOSIS — Z7951 Long term (current) use of inhaled steroids: Secondary | ICD-10-CM | POA: Diagnosis not present

## 2019-08-02 DIAGNOSIS — M79641 Pain in right hand: Secondary | ICD-10-CM | POA: Diagnosis not present

## 2019-08-02 DIAGNOSIS — Z743 Need for continuous supervision: Secondary | ICD-10-CM | POA: Diagnosis not present

## 2019-08-02 DIAGNOSIS — M79605 Pain in left leg: Secondary | ICD-10-CM | POA: Diagnosis not present

## 2019-08-02 DIAGNOSIS — N189 Chronic kidney disease, unspecified: Secondary | ICD-10-CM | POA: Diagnosis not present

## 2019-08-02 DIAGNOSIS — E1122 Type 2 diabetes mellitus with diabetic chronic kidney disease: Secondary | ICD-10-CM | POA: Diagnosis not present

## 2019-08-02 DIAGNOSIS — E785 Hyperlipidemia, unspecified: Secondary | ICD-10-CM | POA: Diagnosis not present

## 2019-08-02 DIAGNOSIS — I509 Heart failure, unspecified: Secondary | ICD-10-CM | POA: Diagnosis not present

## 2019-08-02 DIAGNOSIS — J45909 Unspecified asthma, uncomplicated: Secondary | ICD-10-CM | POA: Diagnosis not present

## 2019-08-02 DIAGNOSIS — M109 Gout, unspecified: Secondary | ICD-10-CM | POA: Diagnosis not present

## 2019-08-02 IMAGING — US US ABDOMEN LIMITED
1 series · 13 of 25 positions shown · non-contrast
Comparison: Visualized upper abdomen on CTA chest 11/03/2016 and
10/07/2016.

CLINICAL DATA: Acute onset of right upper quadrant abdominal pain
that began yesterday. Patient had an indwelling cholecystostomy tube
from [facility] through July 2016. Elevated serum alkaline phosphatase
levels currently.

EXAM:
ULTRASOUND ABDOMEN LIMITED RIGHT UPPER QUADRANT

[Series 1: us abdomen limited · 0.22mm/px · 13 of 30 slices shown]
[im 1/30]
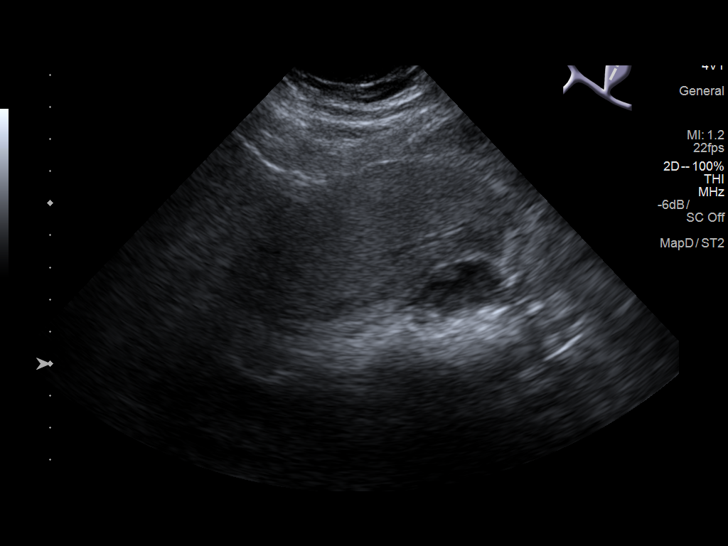
[im 3/30]
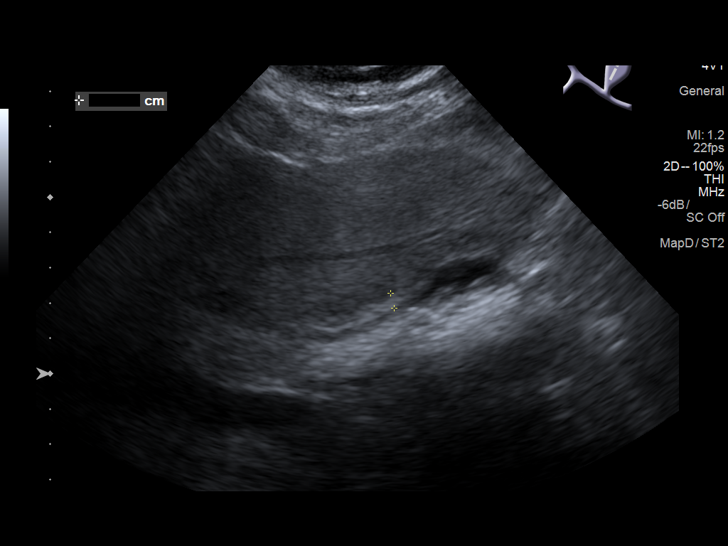
[im 5/30]
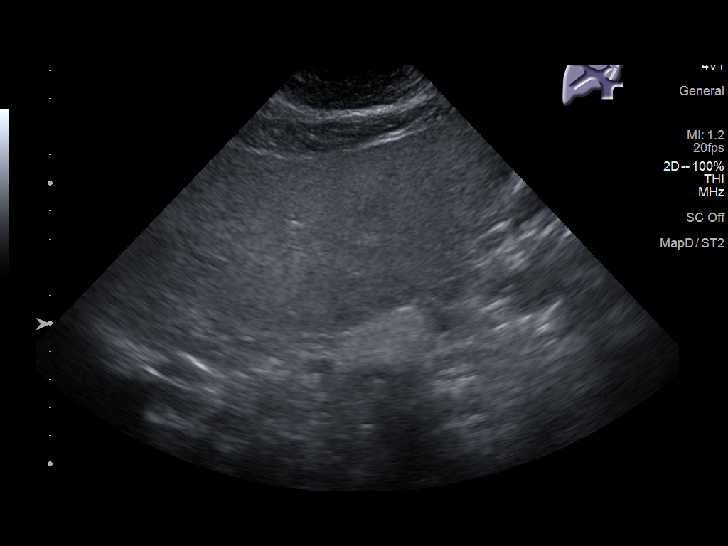
[im 8/30]
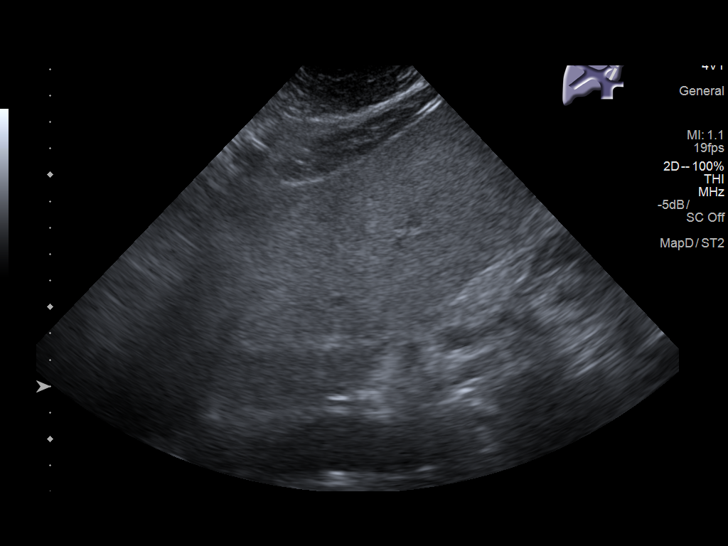
[im 10/30]
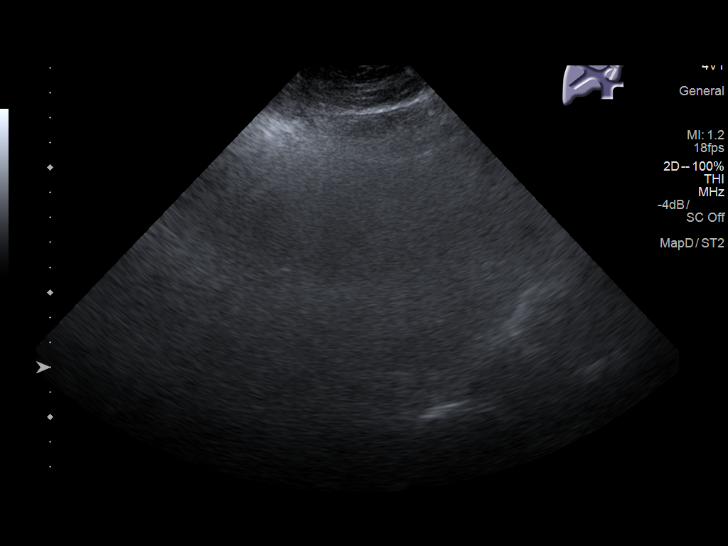
[im 13/30]
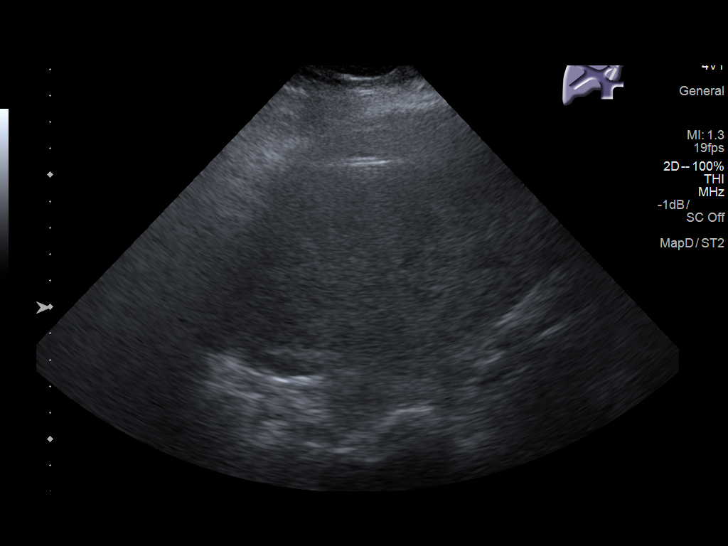
[im 15/30]
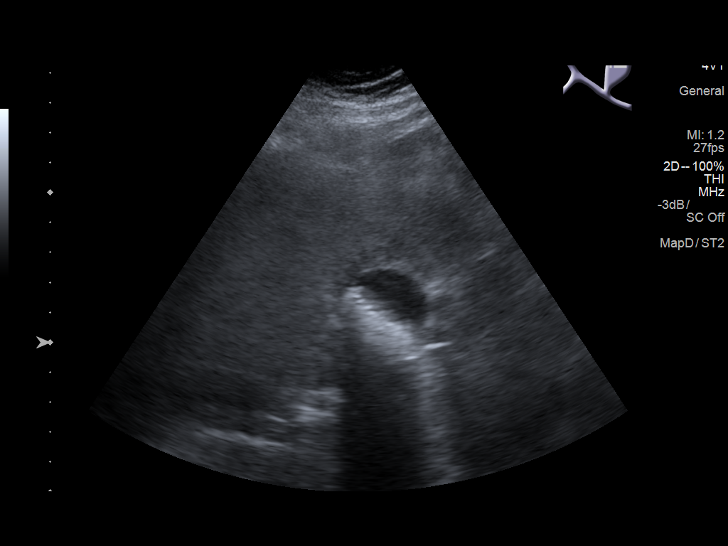
[im 17/30]
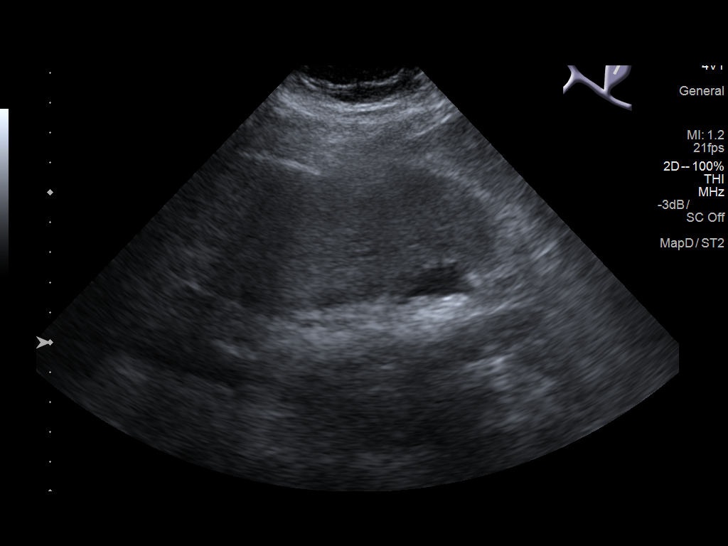
[im 20/30]
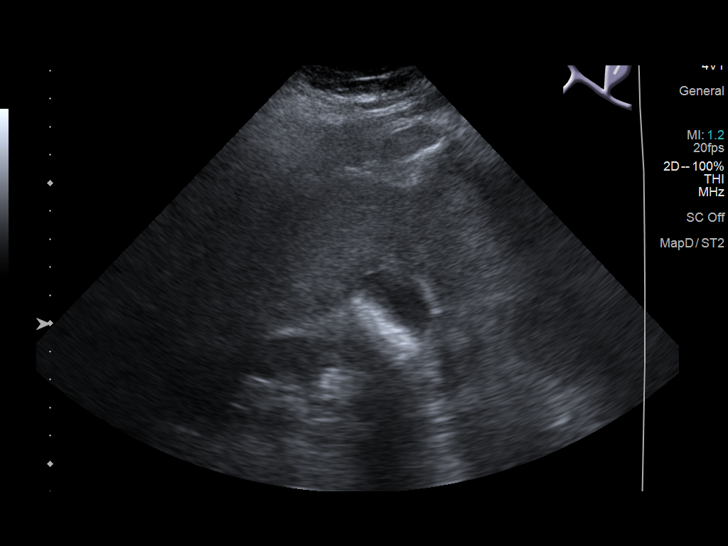
[im 22/30]
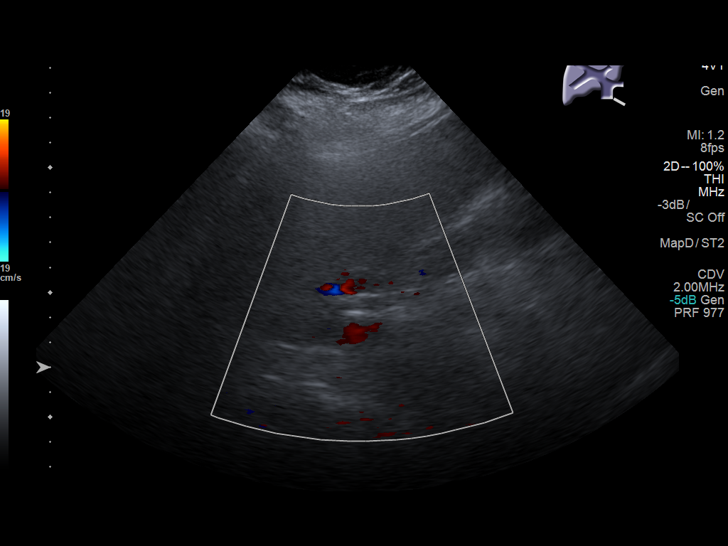
[im 25/30]
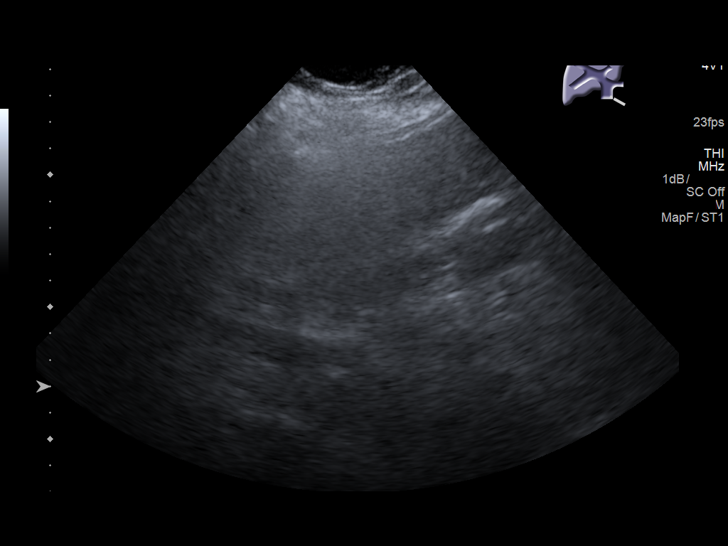
[im 27/30]
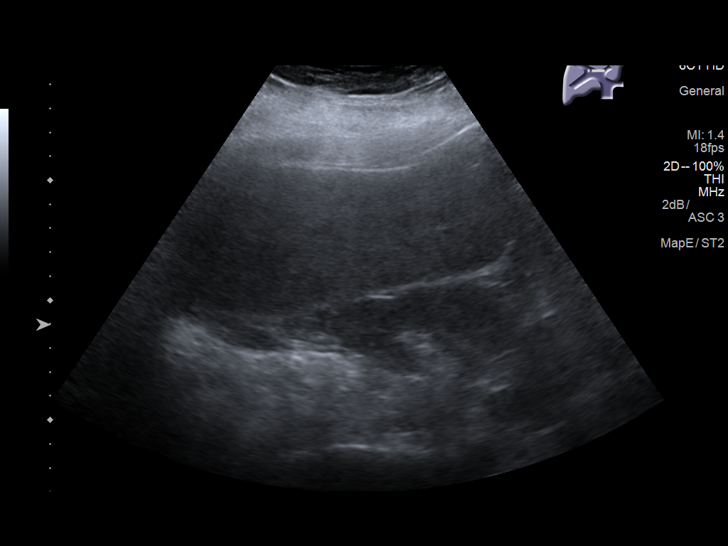
[im 30/30]
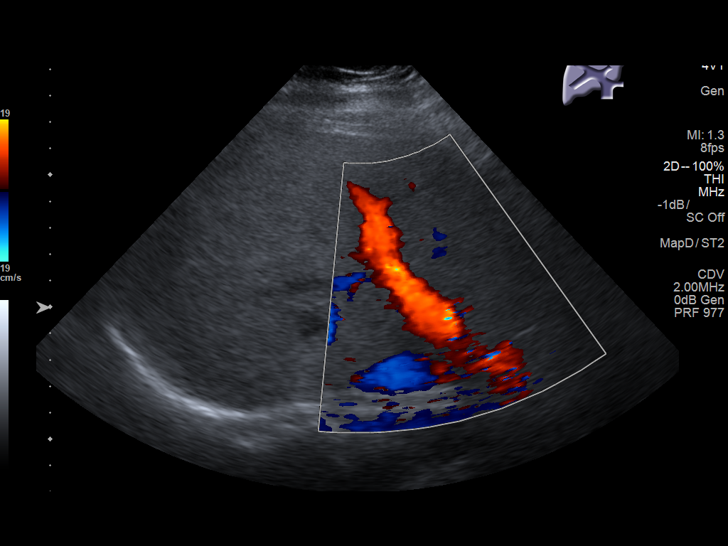

[13 of 25 positions shown; findings below may reference images not displayed]

Urinary tract ultrasound 09/18/2016. No prior right
upper quadrant abdominal ultrasound.
FINDINGS: Gallbladder:

Contracted containing multiple shadowing gallstones, the largest on
the order of 6 mm in size. Mild wall thickening up to 4 mm. No
pericholecystic fluid. Negative sonographic Murphy's sign according
to the ultrasound technologist.

Common bile duct:

Diameter: Approximately 5 mm.

The distal common bile duct is obscured by duodenal bowel gas.

Liver:

Diffusely increased and coarsened echotexture without focal hepatic
parenchymal abnormality. Portal vein is patent on color Doppler
imaging with normal direction of blood flow towards the liver.
IMPRESSION: 1. Cholelithiasis. Contracted gallbladder which accounts for mild
wall thickening. No convincing sonographic evidence of acute
cholecystitis.
2. No biliary ductal dilation.
3. Diffuse hepatic steatosis and/or hepatocellular disease without
focal hepatic parenchymal abnormality.

## 2019-08-04 ENCOUNTER — Other Ambulatory Visit: Payer: Self-pay

## 2019-08-04 ENCOUNTER — Other Ambulatory Visit (HOSPITAL_COMMUNITY): Payer: Self-pay | Admitting: Psychiatry

## 2019-08-04 ENCOUNTER — Telehealth (INDEPENDENT_AMBULATORY_CARE_PROVIDER_SITE_OTHER): Payer: Medicare Other | Admitting: Psychiatry

## 2019-08-04 DIAGNOSIS — F5081 Binge eating disorder: Secondary | ICD-10-CM

## 2019-08-04 DIAGNOSIS — F250 Schizoaffective disorder, bipolar type: Secondary | ICD-10-CM | POA: Diagnosis not present

## 2019-08-04 DIAGNOSIS — E1142 Type 2 diabetes mellitus with diabetic polyneuropathy: Secondary | ICD-10-CM

## 2019-08-04 DIAGNOSIS — F4310 Post-traumatic stress disorder, unspecified: Secondary | ICD-10-CM

## 2019-08-04 MED ORDER — DIAZEPAM 10 MG PO TABS: 10.0000 mg | ORAL_TABLET | Freq: Three times a day (TID) | ORAL | 2 refills | Status: DC

## 2019-08-04 MED ORDER — ESCITALOPRAM OXALATE 20 MG PO TABS: 40.0000 mg | ORAL_TABLET | Freq: Every day | ORAL | 0 refills | Status: DC

## 2019-08-04 MED ORDER — PRAZOSIN HCL 5 MG PO CAPS: 5.0000 mg | ORAL_CAPSULE | Freq: Two times a day (BID) | ORAL | 2 refills | Status: DC

## 2019-08-04 MED ORDER — LISDEXAMFETAMINE DIMESYLATE 70 MG PO CAPS: 70.0000 mg | ORAL_CAPSULE | Freq: Every day | ORAL | 0 refills | Status: DC

## 2019-08-04 MED ORDER — ASENAPINE MALEATE 10 MG SL SUBL: 10.0000 mg | SUBLINGUAL_TABLET | Freq: Two times a day (BID) | SUBLINGUAL | 2 refills | Status: DC

## 2019-08-04 MED ORDER — GABAPENTIN 600 MG PO TABS: 600.0000 mg | ORAL_TABLET | Freq: Two times a day (BID) | ORAL | 1 refills | Status: DC

## 2019-08-04 NOTE — Progress Notes (Signed)
[place] MD/PA/NP OP Progress Note  08/04/2019 3:19 PM Tiffany [name]  MRN:  [id number] Interview was conducted by phone and I verified that I was speaking with the correct person using two identifiers. I discussed the limitations of evaluation and management by telemedicine and  the availability of in person appointments. Patient expressed understanding and agreed to proceed. Patient location - home; physician - home office.  Chief Complaint: Depression, increased appetite, paranoia, joint pain, off meds.  HPI: 59yo divorced AAF with schizoaffective disorder bipolar type(vs paranoid schizophrenia),chronicPTSD,remote hx ofcocaineaddiction (clean for4years)and bingeeating disorder.She tried ziprazidone but developedQTC prolongation on itso it was changed toSaphriswhich she believes is a best medication she has ever been on. [name] also on escitalopram andwas onVyvanse for binge eating disorder. Tiffany [name] used to weigh over 300 lbs and since starting Vyvanse her binge eating is well controlled and she was able to lose a lot of weight). She continues to take diazepam 10 mgtidand trazodone for sleep (100 mg plus 50 mg in am "for nerves").She has a strained relationship with her family - mother in particular, but also son. Continues toaccuse mother of going though her belongings, stealing from her and she has called police on her few times. Tiffany [name] moved to [facility] two months ago "to get away from my mother". Unfortunately she has been struggling there. She has been to ED multiple times with various pain complaints. She was also in a psychiatric ward with increased paranoia.  It appears that she has been off her psychotropic meds and has become more depressed and more paranoid than during her last visit here. "They tried to poison me" by what she means they tried to put her back on Geodon. She has not establish care with any local psychiatrist there as yet and now is telling me that she  plans to move away again (no plans to return to [place]).  [name] compliant with her medications, does not abuse street drugs.  Visit Diagnosis:    ICD-10-CM   1. Schizoaffective disorder, bipolar type ([place])  F25.0   2. Diabetic polyneuropathy associated with type 2 diabetes mellitus (HCC)  E11.42 gabapentin (NEURONTIN) 600 MG tablet  3. Binge eating disorder  F50.81   4. PTSD (post-traumatic stress disorder)  F43.10     Past Psychiatric History: Please see intake H&P.  Past Medical History:  Past Medical History:  Diagnosis Date  . Agitation 11/22/2017  . Anoxic brain injury ([place]) 09/08/2016   C. Arrest due to respiratory failure and COPD exacerbation  . Anxiety   . Arthritis    "all over" (04/10/2016)  . Asthma 10/18/2010  . Binge eating disorder   . Cardiac arrest ([place]) 09/08/2016   PEA  . Carotid artery stenosis    1-39% bilateral by dopplers 11/2016  . Chronic diastolic (congestive) heart failure ([place])   . Chronic kidney disease    "I see a kidney dr." (04/10/2016)  . Chronic pain syndrome 06/18/2012  . Chronic post-traumatic stress disorder (PTSD) 05/27/2018  . Chronic respiratory failure with hypoxia and hypercapnia (HCC) 06/22/2015   TRILOGY Vent >AVAPA-ES., Vt target 200-400, Max P 30 , PS max 20 , PS min 6-10 , E Max 6, E Min 4, Rate Auto AVAPS Rate 2 (titrate for pt comfort) , bleed O2 at 5l/m continuous flow .   [name] CKD (chronic kidney disease) stage 3, GFR 30-59 ml/min 12/15/2016  . Closed displaced fracture of fifth metacarpal bone 03/21/2018  . Cocaine use disorder, severe, in sustained remission (  [place]) 12/17/2015  . Complication of anesthesia    decreased bp, decreased heart rate  . COPD (chronic obstructive pulmonary disease) ([place]) 07/08/2014  . Depression   . Diabetic neuropathy ([place]) 04/24/2011  . Difficulty with speech 01/24/2018  . Disorder of nervous system   . Drug abuse ([place]) 11/21/2017  . Dyslipidemia 04/24/2011  . Elevated troponin 04/28/2012  . Emphysema   .  Encephalopathy 11/21/2017  . Essential hypertension 03/22/2016  . Fibula fracture 07/10/2016  . Frequent falls 10/11/2017  . GERD (gastroesophageal reflux disease)   . Gout 04/11/2017  . Heart attack ([place]) 1980s  . History of blood transfusion 1994   "couldn't stop bleeding from my period"  . History of drug abuse in remission ([place]) 11/28/2015   Quit in 2017  . Hyperlipidemia LDL goal <70   . Incontinence   . Manic depression ([place])   . Morbid obesity ([place]) 10/18/2010  . Obstructive sleep apnea 10/18/2010  . On home oxygen therapy    "6L; 24/7" (04/10/2016)  . OSA on CPAP    "wear mask sometimes" (04/10/2016)  . Paranoid ([place])    "sometimes; I'm on RX for it" (04/10/2016)  . Prolonged Q-T interval on ECG   . Rectal bleeding 12/31/2015  . Right carotid bruit 11/09/2016  . Schizoaffective disorder, bipolar type ([place]) 04/05/2018  . Seasonal allergies   . Seborrheic keratoses 12/31/2013  . Seizures ([place])    "don't know what kind; last one was ~ 1 yr ago" (04/10/2016)  . Stroke [facility]) 1980s   denies residual on 04/10/2016  . Thrush 09/19/2013  . Type 2 diabetes mellitus ([place]) 10/18/2010    Past Surgical History:  Procedure Laterality Date  . CESAREAN SECTION  1997  . HERNIA REPAIR    . IR CHOLANGIOGRAM EXISTING TUBE  07/20/2016  . IR PERC CHOLECYSTOSTOMY  05/10/2016  . IR RADIOLOGIST EVAL & MGMT  06/08/2016  . IR RADIOLOGIST EVAL & MGMT  06/29/2016  . IR SINUS/FIST TUBE CHK-NON GI  07/12/2016  . RIGHT/LEFT HEART CATH AND CORONARY ANGIOGRAPHY N/A 06/19/2017   Procedure: RIGHT/LEFT HEART CATH AND CORONARY ANGIOGRAPHY;  Surgeon: Jolaine Artist, MD;  Location: [facility];  Service: Cardiovascular;  Laterality: N/A;  . TIBIA IM NAIL INSERTION Right 07/12/2016   Procedure: INTRAMEDULLARY (IM) NAIL RIGHT TIBIA;  Surgeon: Leandrew Koyanagi, MD;  Location: [facility];  Service: Orthopedics;  Laterality: Right;  . UMBILICAL HERNIA REPAIR  ~ 1963   "that's why I don't have a belly button"  . VAGINAL  HYSTERECTOMY      Family Psychiatric History: Reviewed.  Family History:  Family History  Problem Relation Age of Onset  . Cancer Father        prostate  . Cancer Mother        lung  . Depression Mother   . Depression Sister   . Anxiety disorder Sister   . Schizophrenia Sister   . Bipolar disorder Sister   . Depression Sister   . Depression Brother   . Heart failure Other        cousin    Social History:  Social History   Socioeconomic History  . Marital status: Widowed    Spouse name: Not on file  . Number of children: 3  . Years of education: Not on file  . Highest education level: Not on file  Occupational History  . Occupation: disabled    Comment: factory production  Tobacco Use  . Smoking status: Former Smoker  Packs/day: 1.50    Years: 38.00    Pack years: 57.00    Types: Cigarettes    Start date: 03/13/1977    Quit date: 04/10/2016    Years since quitting: 3.3  . Smokeless tobacco: Never Used  Vaping Use  . Vaping Use: Never used  Substance and Sexual Activity  . Alcohol use: No    Alcohol/week: 0.0 standard drinks  . Drug use: No    Types: Cocaine    Comment: 04/10/2016 "last used cocaine back in November 2017"  . Sexual activity: Not Currently    Birth control/protection: Surgical  Other Topics Concern  . Not on file  Social History Narrative   Has 1 son, [name]   Lives with son and his boyfriend   Her house has ramps and handrails should she ever needs them.    Her mother lives down the street from her and is a good support person in addition to her son.   She drives herself, has private transportation.    Cocaine free since 02/24/16, smoke free since 04/10/16   Social Determinants of Health   Financial Resource Strain:   . Difficulty of Paying Living Expenses:   Food Insecurity:   . Worried About Charity fundraiser in the Last Year:   . Arboriculturist in the Last Year:   Transportation Needs:   . Film/video editor (Medical):   [name]  Lack of Transportation (Non-Medical):   Physical Activity:   . Days of Exercise per Week:   . Minutes of Exercise per Session:   Stress:   . Feeling of Stress :   Social Connections:   . Frequency of Communication with Friends and Family:   . Frequency of Social Gatherings with Friends and Family:   . Attends Religious Services:   . Active Member of Clubs or Organizations:   . Attends Archivist Meetings:   [name] Marital Status:     Allergies:  Allergies  Allergen Reactions  . Hydrocodone Shortness Of Breath  . Hydrocodone-Acetaminophen Shortness Of Breath  . Hydroxyzine Anaphylaxis and Shortness Of Breath  . Latuda [Lurasidone Hcl] Anaphylaxis  . Lurasidone Anaphylaxis  . Magnesium-Containing Compounds Anaphylaxis  . Prednisone Anaphylaxis, Swelling and Other (See Comments)    Tongue swelling  . Tramadol Anaphylaxis and Swelling  . Codeine Nausea And Vomiting  . Other Rash  . Sulfa Antibiotics Itching  . Tape Rash    Metabolic Disorder Labs: Lab Results  Component Value Date   HGBA1C 5.6 11/08/2018   MPG 125.5 01/07/2017   MPG 111.15 11/03/2016   No results found for: PROLACTIN Lab Results  Component Value Date   CHOL 127 08/05/2018   TRIG 53 08/05/2018   HDL 62 08/05/2018   CHOLHDL 2.0 08/05/2018   VLDL 38 01/07/2017   LDLCALC 54 08/05/2018   LDLCALC 56 01/07/2017   Lab Results  Component Value Date   TSH 0.563 03/15/2016   TSH 1.060 12/04/2015    Therapeutic Level Labs: No results found for: LITHIUM Lab Results  Component Value Date   VALPROATE 20 (L) 09/09/2016   VALPROATE 46 (L) 07/10/2016   No components found for:  CBMZ  Current Medications: Current Outpatient Medications  Medication Sig Dispense Refill  . albuterol (PROVENTIL) (2.5 MG/3ML) 0.083% nebulizer solution Take 3 mLs (2.5 mg total) by nebulization every 6 (six) hours as needed for wheezing or shortness of breath. 75 mL 5  . albuterol (VENTOLIN HFA) 108 (90 Base) MCG/ACT  inhaler Inhale 2 puffs into the lungs every 6 (six) hours as needed for wheezing or shortness of breath. 18 g 2  . amLODipine (NORVASC) 10 MG tablet TAKE 1 TABLET (5 MG TOTAL) BY MOUTH EVERY MORNING 90 tablet 0  . Asenapine Maleate 10 MG SUBL Place 1 tablet (10 mg total) under the tongue in the morning and at bedtime. 60 tablet 2  . aspirin (GOODSENSE ASPIRIN LOW DOSE) 81 MG EC tablet TAKE 1 TABLET (81 MG TOTAL) BY MOUTH DAILY (MORNING). 90 tablet 3  . atorvastatin (LIPITOR) 80 MG tablet Take 1 tablet (80 mg total) by mouth daily. 90 tablet 0  . budesonide-formoterol (SYMBICORT) 80-4.5 MCG/ACT inhaler Inhale 2 puffs into the lungs 2 (two) times daily. 1 Inhaler 2  . colchicine 0.6 MG tablet TAKE 1 TABLET 2 (TWO) TIMES DAILY. TAKE 1 TABLET (0.6 MG TOTAL) BY MOUTH 2 (TWO) TIMES DAILY. 60 tablet 0  . diazepam (VALIUM) 10 MG tablet Take 1 tablet (10 mg total) by mouth 3 (three) times daily. 90 tablet 2  . diclofenac (VOLTAREN) 75 MG EC tablet Take 1 tablet (75 mg total) by mouth 2 (two) times daily. 60 tablet 1  . Diclofenac Sodium 3 % GEL Apply 1 application topically daily as needed. 100 g 0  . doxazosin (CARDURA) 2 MG tablet Take 1 tablet (2 mg total) by mouth daily. 30 tablet 0  . Efinaconazole 10 % SOLN Apply 1 application topically daily. (Patient not taking: Reported on 05/14/2019) 8 mL 0  . escitalopram (LEXAPRO) 20 MG tablet Take 2 tablets (40 mg total) by mouth daily. 180 tablet 0  . febuxostat (ULORIC) 40 MG tablet Take 1 tablet (40 mg total) by mouth daily. 30 tablet 0  . ferrous sulfate 325 (65 FE) MG tablet Take 1 tablet (325 mg total) by mouth daily with breakfast. 90 tablet 1  . Fluticasone-Umeclidin-Vilant (TRELEGY ELLIPTA) 100-62.5-25 MCG/INH AEPB Inhale 1 puff into the lungs daily. 60 each 3  . gabapentin (NEURONTIN) 600 MG tablet Take 1 tablet (600 mg total) by mouth 2 (two) times daily. 180 tablet 1  . lisdexamfetamine (VYVANSE) 70 MG capsule Take 1 capsule (70 mg total) by mouth  daily before breakfast. 30 capsule 0  . methocarbamol (ROBAXIN) 500 MG tablet Take 500 mg by mouth 2 (two) times daily as needed.    . metolazone (ZAROXOLYN) 2.5 MG tablet Take 1 tablet (2.5 mg total) by mouth daily. 90 tablet 1  . montelukast (SINGULAIR) 10 MG tablet Take 10 mg by mouth at bedtime.    . Multiple Vitamin (MULTIVITAMIN WITH MINERALS) TABS tablet Take 1 tablet by mouth daily.    [name] omeprazole (PRILOSEC) 40 MG capsule Take 1 capsule (40 mg total) by mouth 2 (two) times daily. 180 capsule 3  . oxybutynin (DITROPAN-XL) 10 MG 24 hr tablet Take 1-2 tablets (10-20 mg total) by mouth at bedtime. (Patient taking differently: Take 10 mg by mouth 2 (two) times daily. ) 60 tablet 1  . potassium chloride SA (KLOR-CON) 20 MEQ tablet Take 2 tablets (40 mEq total) by mouth 2 (two) times daily. 360 tablet 1  . prazosin (MINIPRESS) 5 MG capsule Take 1 capsule (5 mg total) by mouth 2 (two) times daily. 60 capsule 2  . terbinafine (LAMISIL) 250 MG tablet Take 1 tablet (250 mg total) by mouth daily. 84 tablet 0  . traZODone (DESYREL) 100 MG tablet TAKE 1 TABLET BY MOUTH AT BEDTIME 90 tablet 0  . traZODone (DESYREL) 50 MG tablet  Take 1 tablet (50 mg total) by mouth daily. 90 tablet 0   No current facility-administered medications for this visit.     Psychiatric Specialty Exam: Review of Systems  Constitutional: Positive for unexpected weight change.  Musculoskeletal: Positive for arthralgias, gait problem and joint swelling.  Psychiatric/Behavioral: Positive for sleep disturbance. The patient is nervous/anxious.     There were no vitals taken for this visit.There is no height or weight on file to calculate BMI.  General Appearance: NA  Eye Contact:  NA  Speech:  Clear and Coherent and Normal Rate  Volume:  Normal  Mood:  Anxious and Depressed  Affect:  NA  Thought Process:  Descriptions of Associations: Circumstantial  Orientation:  Full (Time, Place, and Person)  Thought Content: Paranoid  Ideation and Rumination   Suicidal Thoughts:  No  Homicidal Thoughts:  No  Memory:  Immediate;   Fair Recent;   Fair Remote;   Good  Judgement:  Fair  Insight:  Fair  Psychomotor Activity:  NA  Concentration:  Concentration: Fair  Recall:  [place] of Knowledge: Fair  Language: Good  Akathisia:  Negative  Handed:  Right  AIMS (if indicated): not done  Assets:  Communication Skills Desire for Improvement Financial Resources/Insurance Resilience  ADL's:  Intact  Cognition: WNL  Sleep:  Fair   Screenings: AUDIT     Admission (Discharged) from 12/01/2015 in [place]  Alcohol Use Disorder Identification Test Final Score (AUDIT) 0    GAD-7     Office Visit from 03/21/2018 in [place] for [facility]  Total GAD-7 Score 4    PHQ2-9     Office Visit from confidential encounter on 12/11/2018 Office Visit from confidential encounter on 12/10/2018 Office Visit from confidential encounter on 11/18/2018 Office Visit from confidential encounter on 11/08/2018 Telemedicine from confidential encounter on 10/25/2018  PHQ-2 Total Score 0 4 2 1 4   PHQ-9 Total Score -- 10 8 1 16        Assessment and Plan:  59yo divorced AAF with schizoaffective disorder bipolar type(vs paranoid schizophrenia),chronicPTSD,remote hx ofcocaineaddiction (clean for4years)and bingeeating disorder.She tried ziprazidone but developedQTC prolongation on itso it was changed toSaphriswhich she believes is a best medication she has ever been on. [name] also on escitalopram andwas onVyvanse for binge eating disorder. Tiffany [name] used to weigh over 300 lbs and since starting Vyvanse her binge eating is well controlled and she was able to lose a lot of weight). She continues to take diazepam 10 mgtidand trazodone for sleep (100 mg plus 50 mg in am "for nerves").She has a strained relationship with her family - mother in particular, but also son. Continues toaccuse mother of  going though her belongings, stealing from her and she has called police on her few times. Tiffany [name] moved to [facility] two months ago "to get away from my mother". Unfortunately she has been struggling there. She has been to ED multiple times with various pain complaints. She was also in a psychiatric ward with increased paranoia.  It appears that she has been off her psychotropic meds and has become more depressed and more paranoid than during her last visit here. "They tried to poison me" by what she means they tried to put her back on Geodon. She has not establish care with any local psychiatrist there as yet and now is telling me that she plans to move away again (no plans to return to [place]).  [name] compliant with her medications, does not  abuse street drugs.  Dx: Schizoaffective disorder bipolar type; PTSD chronic  Plan: Continue current meds: Lexapro 40 mg, trazodone,Saphristo 10 mg bid (was on tid previously),diazepam,Vyvanse,prazosin 5 mg bid.She now tells me that Rx [facility] [place]. Next appointment in6 weeks. The plan was discussed with patient who had an opportunity to ask questions and these were all answered.I spend36minutes in phone consultation with the patient.   Stephanie Acre, MD 08/04/2019, 3:19 PM

## 2019-08-06 ENCOUNTER — Ambulatory Visit: Payer: Medicare Other | Admitting: Internal Medicine

## 2019-08-11 DIAGNOSIS — N1832 Chronic kidney disease, stage 3b: Secondary | ICD-10-CM | POA: Diagnosis not present

## 2019-08-11 DIAGNOSIS — J9621 Acute and chronic respiratory failure with hypoxia: Secondary | ICD-10-CM | POA: Diagnosis not present

## 2019-08-11 DIAGNOSIS — K921 Melena: Secondary | ICD-10-CM | POA: Diagnosis not present

## 2019-08-11 DIAGNOSIS — R52 Pain, unspecified: Secondary | ICD-10-CM | POA: Diagnosis not present

## 2019-08-11 DIAGNOSIS — I252 Old myocardial infarction: Secondary | ICD-10-CM | POA: Diagnosis not present

## 2019-08-11 DIAGNOSIS — E872 Acidosis: Secondary | ICD-10-CM | POA: Diagnosis not present

## 2019-08-11 DIAGNOSIS — E785 Hyperlipidemia, unspecified: Secondary | ICD-10-CM | POA: Diagnosis not present

## 2019-08-11 DIAGNOSIS — E876 Hypokalemia: Secondary | ICD-10-CM | POA: Diagnosis not present

## 2019-08-11 DIAGNOSIS — J441 Chronic obstructive pulmonary disease with (acute) exacerbation: Secondary | ICD-10-CM | POA: Diagnosis not present

## 2019-08-11 DIAGNOSIS — K219 Gastro-esophageal reflux disease without esophagitis: Secondary | ICD-10-CM | POA: Diagnosis not present

## 2019-08-11 DIAGNOSIS — M4800 Spinal stenosis, site unspecified: Secondary | ICD-10-CM | POA: Diagnosis not present

## 2019-08-11 DIAGNOSIS — R279 Unspecified lack of coordination: Secondary | ICD-10-CM | POA: Diagnosis not present

## 2019-08-11 DIAGNOSIS — T50905A Adverse effect of unspecified drugs, medicaments and biological substances, initial encounter: Secondary | ICD-10-CM | POA: Diagnosis not present

## 2019-08-11 DIAGNOSIS — G549 Nerve root and plexus disorder, unspecified: Secondary | ICD-10-CM | POA: Diagnosis not present

## 2019-08-11 DIAGNOSIS — Z791 Long term (current) use of non-steroidal anti-inflammatories (NSAID): Secondary | ICD-10-CM | POA: Diagnosis not present

## 2019-08-11 DIAGNOSIS — K209 Esophagitis, unspecified without bleeding: Secondary | ICD-10-CM | POA: Diagnosis not present

## 2019-08-11 DIAGNOSIS — R54 Age-related physical debility: Secondary | ICD-10-CM | POA: Diagnosis not present

## 2019-08-11 DIAGNOSIS — Z20822 Contact with and (suspected) exposure to covid-19: Secondary | ICD-10-CM | POA: Diagnosis not present

## 2019-08-11 DIAGNOSIS — M542 Cervicalgia: Secondary | ICD-10-CM | POA: Diagnosis not present

## 2019-08-11 DIAGNOSIS — M109 Gout, unspecified: Secondary | ICD-10-CM | POA: Diagnosis not present

## 2019-08-11 DIAGNOSIS — I10 Essential (primary) hypertension: Secondary | ICD-10-CM | POA: Diagnosis not present

## 2019-08-11 DIAGNOSIS — J9602 Acute respiratory failure with hypercapnia: Secondary | ICD-10-CM | POA: Diagnosis not present

## 2019-08-11 DIAGNOSIS — K2970 Gastritis, unspecified, without bleeding: Secondary | ICD-10-CM | POA: Diagnosis not present

## 2019-08-11 DIAGNOSIS — Z87891 Personal history of nicotine dependence: Secondary | ICD-10-CM | POA: Diagnosis not present

## 2019-08-11 DIAGNOSIS — E559 Vitamin D deficiency, unspecified: Secondary | ICD-10-CM | POA: Diagnosis not present

## 2019-08-11 DIAGNOSIS — Z743 Need for continuous supervision: Secondary | ICD-10-CM | POA: Diagnosis not present

## 2019-08-11 DIAGNOSIS — R531 Weakness: Secondary | ICD-10-CM | POA: Diagnosis not present

## 2019-08-11 DIAGNOSIS — M6281 Muscle weakness (generalized): Secondary | ICD-10-CM | POA: Diagnosis not present

## 2019-08-11 DIAGNOSIS — D649 Anemia, unspecified: Secondary | ICD-10-CM | POA: Diagnosis not present

## 2019-08-11 DIAGNOSIS — M5136 Other intervertebral disc degeneration, lumbar region: Secondary | ICD-10-CM | POA: Diagnosis not present

## 2019-08-11 DIAGNOSIS — N3281 Overactive bladder: Secondary | ICD-10-CM | POA: Diagnosis not present

## 2019-08-11 DIAGNOSIS — N179 Acute kidney failure, unspecified: Secondary | ICD-10-CM | POA: Diagnosis not present

## 2019-08-11 DIAGNOSIS — M48061 Spinal stenosis, lumbar region without neurogenic claudication: Secondary | ICD-10-CM | POA: Diagnosis not present

## 2019-08-11 DIAGNOSIS — J302 Other seasonal allergic rhinitis: Secondary | ICD-10-CM | POA: Diagnosis not present

## 2019-08-11 DIAGNOSIS — R0602 Shortness of breath: Secondary | ICD-10-CM | POA: Diagnosis not present

## 2019-08-11 DIAGNOSIS — R404 Transient alteration of awareness: Secondary | ICD-10-CM | POA: Diagnosis not present

## 2019-08-11 DIAGNOSIS — G4700 Insomnia, unspecified: Secondary | ICD-10-CM | POA: Diagnosis not present

## 2019-08-11 DIAGNOSIS — R0902 Hypoxemia: Secondary | ICD-10-CM | POA: Diagnosis not present

## 2019-08-11 DIAGNOSIS — J449 Chronic obstructive pulmonary disease, unspecified: Secondary | ICD-10-CM | POA: Diagnosis not present

## 2019-08-11 DIAGNOSIS — D509 Iron deficiency anemia, unspecified: Secondary | ICD-10-CM | POA: Diagnosis not present

## 2019-08-11 DIAGNOSIS — J9622 Acute and chronic respiratory failure with hypercapnia: Secondary | ICD-10-CM | POA: Diagnosis not present

## 2019-08-11 DIAGNOSIS — R5381 Other malaise: Secondary | ICD-10-CM | POA: Diagnosis not present

## 2019-08-11 DIAGNOSIS — Z8673 Personal history of transient ischemic attack (TIA), and cerebral infarction without residual deficits: Secondary | ICD-10-CM | POA: Diagnosis not present

## 2019-08-11 DIAGNOSIS — G92 Toxic encephalopathy: Secondary | ICD-10-CM | POA: Diagnosis not present

## 2019-08-11 DIAGNOSIS — R262 Difficulty in walking, not elsewhere classified: Secondary | ICD-10-CM | POA: Diagnosis not present

## 2019-08-11 DIAGNOSIS — N189 Chronic kidney disease, unspecified: Secondary | ICD-10-CM | POA: Diagnosis not present

## 2019-08-11 DIAGNOSIS — Z789 Other specified health status: Secondary | ICD-10-CM | POA: Diagnosis not present

## 2019-08-11 DIAGNOSIS — I509 Heart failure, unspecified: Secondary | ICD-10-CM | POA: Diagnosis not present

## 2019-08-11 DIAGNOSIS — G9341 Metabolic encephalopathy: Secondary | ICD-10-CM | POA: Diagnosis not present

## 2019-08-11 DIAGNOSIS — M130 Polyarthritis, unspecified: Secondary | ICD-10-CM | POA: Diagnosis not present

## 2019-08-14 ENCOUNTER — Telehealth: Payer: Self-pay | Admitting: Internal Medicine

## 2019-08-14 NOTE — Telephone Encounter (Signed)
CB# [phone] :room #[id number].

## 2019-08-14 NOTE — Telephone Encounter (Signed)
Patient has established care at a different PCP office. She will need to contact that office with any concerns.

## 2019-08-14 NOTE — Telephone Encounter (Signed)
Patient called in to inform pcp that she is currently in [facility]. She stated that she doesn't trust the care that she is currently getting. Patient stated that she has been having diarrhea really bad and having terrible leg pain. And has not been addressed yet. Patient stated that her main complaint when she went in was her gout pain and her arthritis and she has yet to be treated for it. Patient is requesting for pcp advise. Please follow up at your earliest convenience.

## 2019-08-16 DIAGNOSIS — E785 Hyperlipidemia, unspecified: Secondary | ICD-10-CM | POA: Diagnosis not present

## 2019-08-16 DIAGNOSIS — N189 Chronic kidney disease, unspecified: Secondary | ICD-10-CM | POA: Diagnosis not present

## 2019-08-16 DIAGNOSIS — G4700 Insomnia, unspecified: Secondary | ICD-10-CM | POA: Diagnosis not present

## 2019-08-16 DIAGNOSIS — M48061 Spinal stenosis, lumbar region without neurogenic claudication: Secondary | ICD-10-CM | POA: Diagnosis not present

## 2019-08-16 DIAGNOSIS — J441 Chronic obstructive pulmonary disease with (acute) exacerbation: Secondary | ICD-10-CM | POA: Diagnosis not present

## 2019-08-16 DIAGNOSIS — N3281 Overactive bladder: Secondary | ICD-10-CM | POA: Diagnosis not present

## 2019-08-16 DIAGNOSIS — K921 Melena: Secondary | ICD-10-CM | POA: Diagnosis not present

## 2019-08-16 DIAGNOSIS — J302 Other seasonal allergic rhinitis: Secondary | ICD-10-CM | POA: Diagnosis not present

## 2019-08-16 DIAGNOSIS — E876 Hypokalemia: Secondary | ICD-10-CM | POA: Diagnosis not present

## 2019-08-16 DIAGNOSIS — M5136 Other intervertebral disc degeneration, lumbar region: Secondary | ICD-10-CM | POA: Diagnosis not present

## 2019-08-16 DIAGNOSIS — E559 Vitamin D deficiency, unspecified: Secondary | ICD-10-CM | POA: Diagnosis not present

## 2019-08-16 DIAGNOSIS — R262 Difficulty in walking, not elsewhere classified: Secondary | ICD-10-CM | POA: Diagnosis not present

## 2019-08-16 DIAGNOSIS — I10 Essential (primary) hypertension: Secondary | ICD-10-CM | POA: Diagnosis not present

## 2019-08-16 DIAGNOSIS — K219 Gastro-esophageal reflux disease without esophagitis: Secondary | ICD-10-CM | POA: Diagnosis not present

## 2019-08-16 DIAGNOSIS — G9341 Metabolic encephalopathy: Secondary | ICD-10-CM | POA: Diagnosis not present

## 2019-08-16 DIAGNOSIS — M6281 Muscle weakness (generalized): Secondary | ICD-10-CM | POA: Diagnosis not present

## 2019-08-16 DIAGNOSIS — Z789 Other specified health status: Secondary | ICD-10-CM | POA: Diagnosis not present

## 2019-08-16 DIAGNOSIS — M109 Gout, unspecified: Secondary | ICD-10-CM | POA: Diagnosis not present

## 2019-08-16 DIAGNOSIS — R279 Unspecified lack of coordination: Secondary | ICD-10-CM | POA: Diagnosis not present

## 2019-08-16 DIAGNOSIS — D649 Anemia, unspecified: Secondary | ICD-10-CM | POA: Diagnosis not present

## 2019-08-16 DIAGNOSIS — J9602 Acute respiratory failure with hypercapnia: Secondary | ICD-10-CM | POA: Diagnosis not present

## 2019-08-22 IMAGING — DX DG CHEST 1V PORT
1 series · 1 of 1 positions shown · non-contrast
Comparison: 07/14/2016

CLINICAL DATA: Sudden onset shortness of breath.

EXAM:
PORTABLE CHEST 1 VIEW

[chest ap]
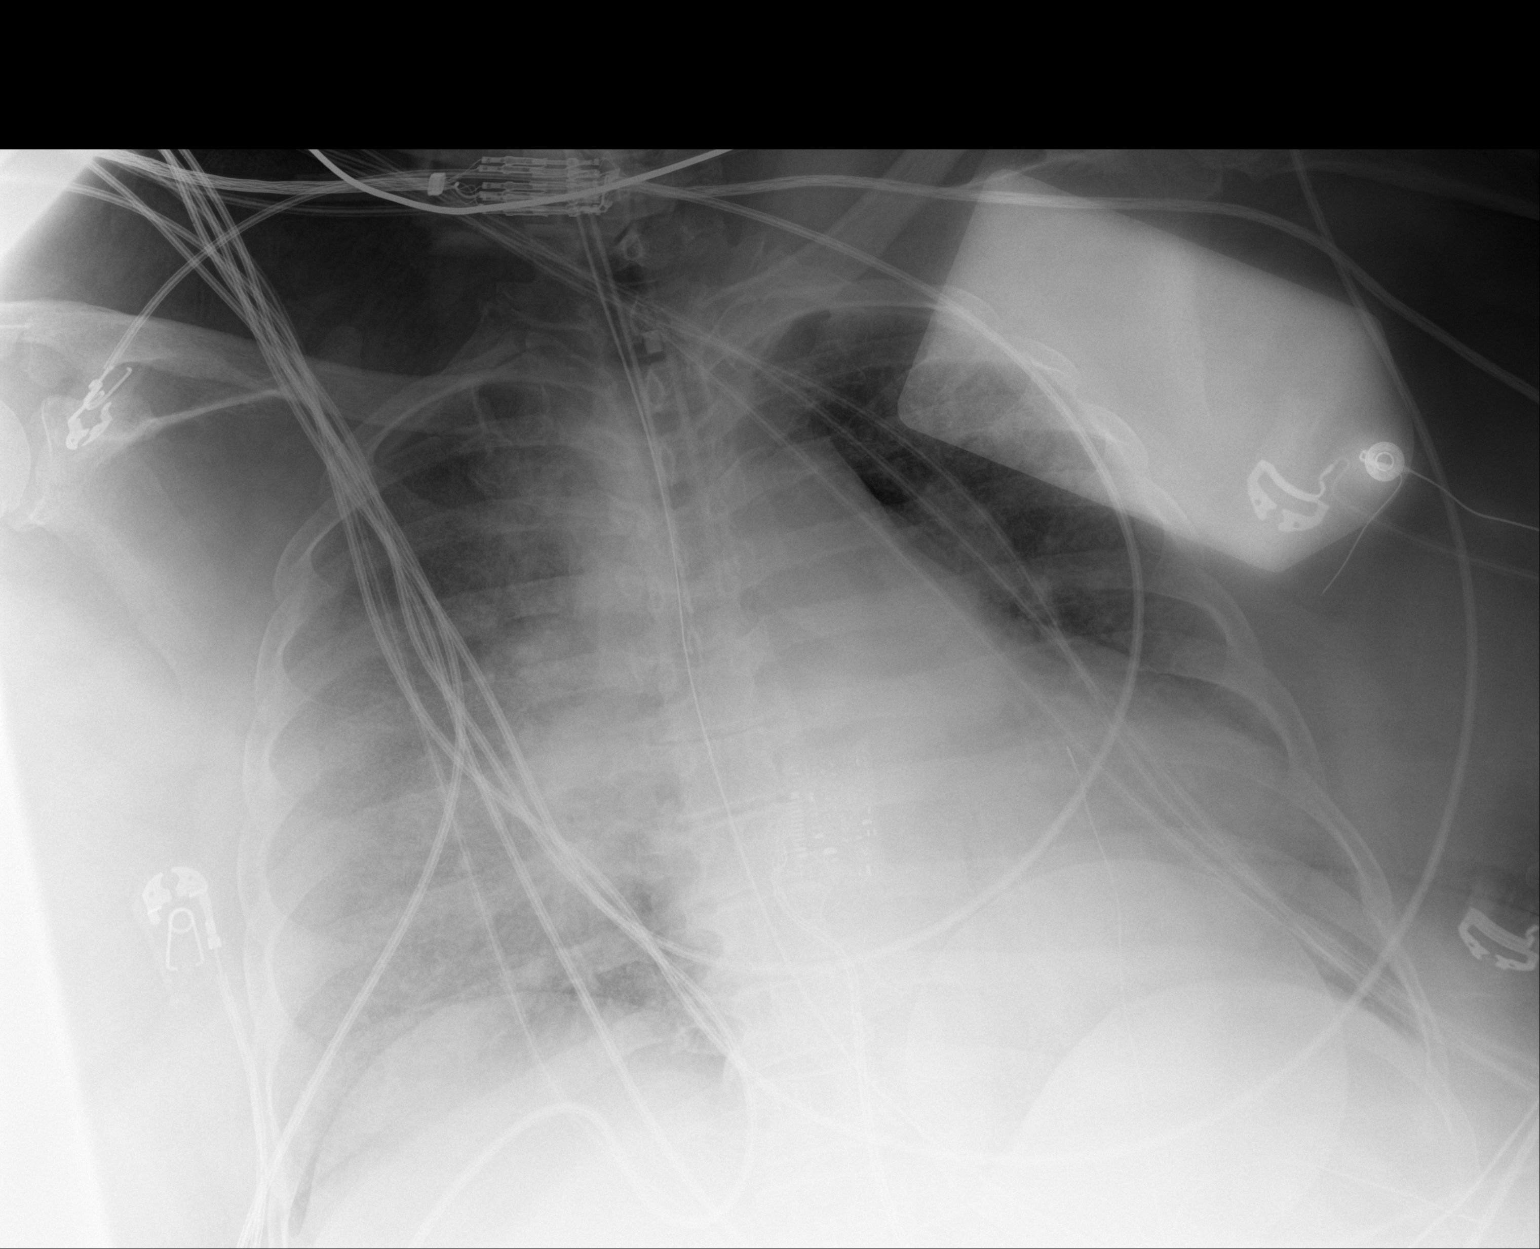

[1 of 1 positions shown; findings below may reference images not displayed]

FINDINGS: Endotracheal tube tip is 1 cm above the carina. An orogastric tube
at least reaches the stomach. Extensive artifact from EKG wires and
defibrillator pads. Cardiomegaly. Diffuse hazy density of the lungs,
favor pulmonary edema. No effusion or pneumothorax. No visible rib
fracture post CPR.
IMPRESSION: 1. Endotracheal tube tip is 1 cm above the carina. An orogastric
tube reaches the stomach at least.
2. Diffuse lung opacity favoring edema.  Chronic cardiomegaly.

## 2019-08-23 IMAGING — DX DG CHEST 1V PORT
1 series · 1 of 1 positions shown · non-contrast
Comparison: 09/08/2016

CLINICAL DATA: Check endotracheal tube placement

EXAM:
PORTABLE CHEST 1 VIEW

[chest]
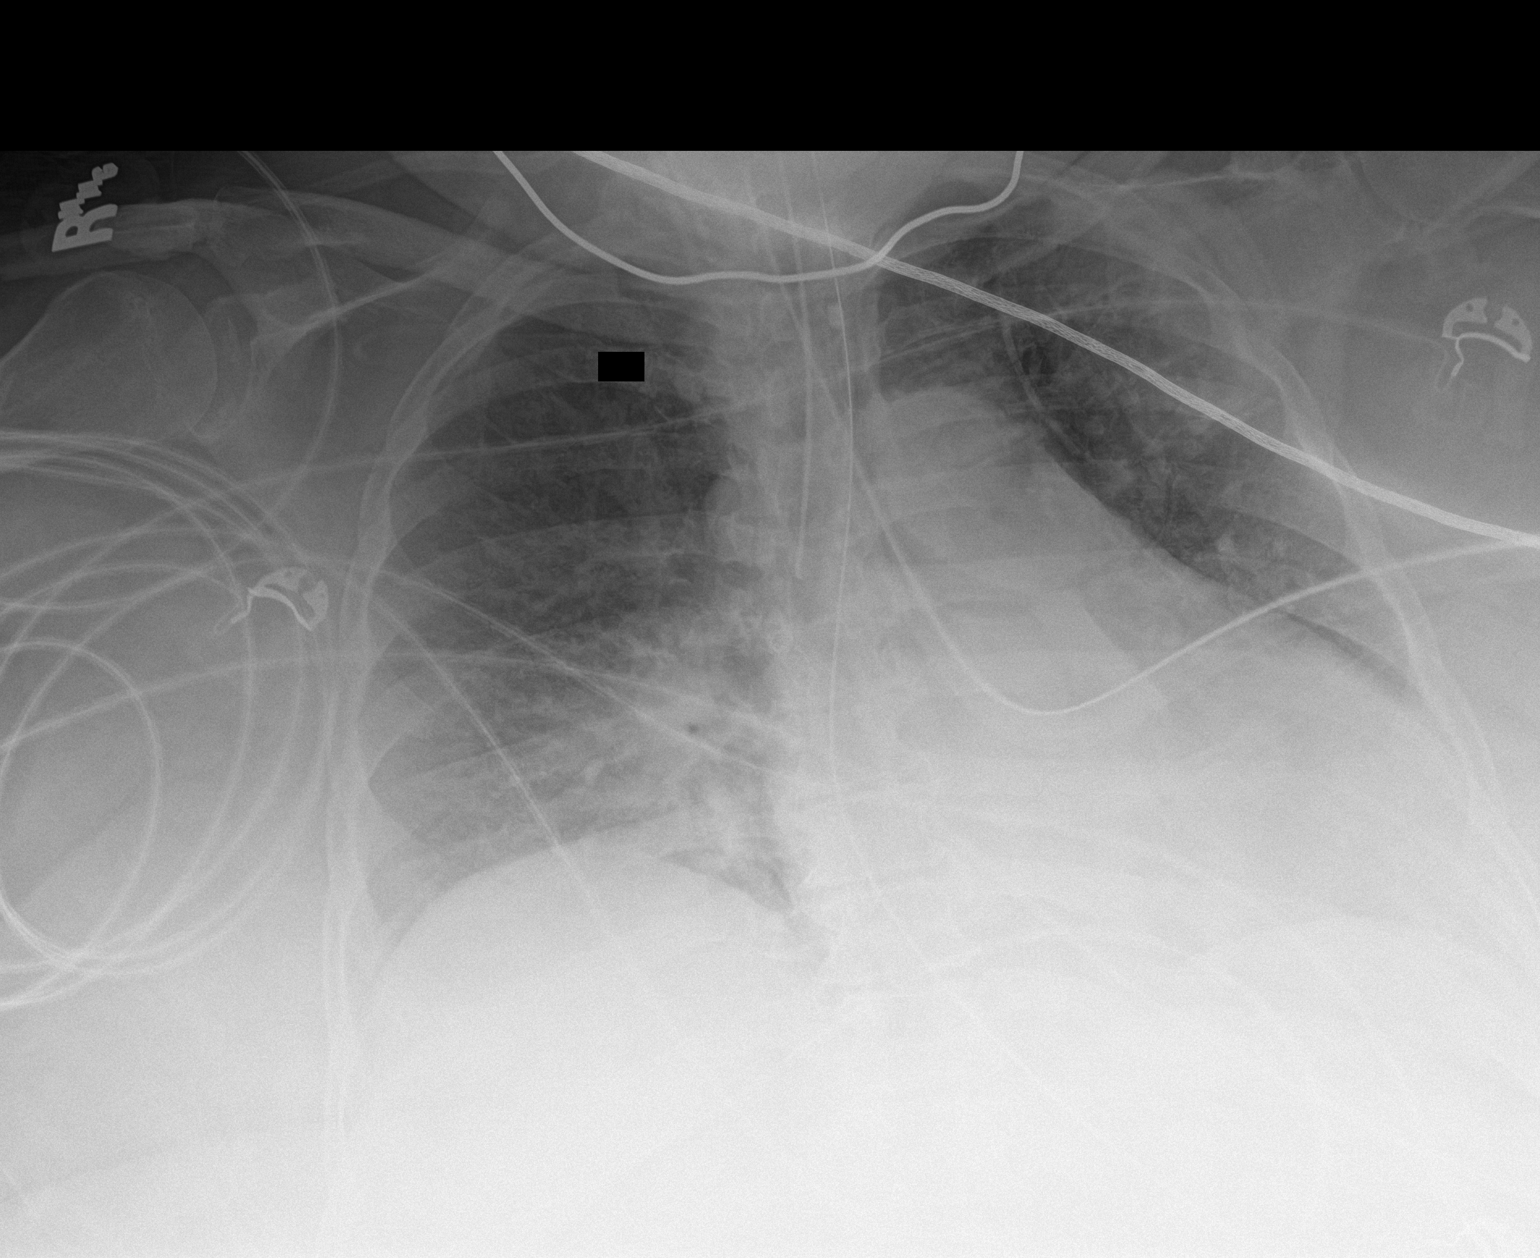

[1 of 1 positions shown; findings below may reference images not displayed]

FINDINGS: Cardiac shadow is enlarged but stable. Endotracheal tube is noted in
the right mainstem bronchus. This should be withdrawn approximately
3 cm. Lungs are well aerated bilaterally. Some patchy changes are
noted in the right lung base new from the prior exam. Changes in the
right upper lung have resolved in the interval. No bony abnormality
is noted.
IMPRESSION: Patchy right basilar opacity.

Endotracheal tube in the right mainstem bronchus. This should be
withdrawn 3 cm.

These results will be called to the ordering clinician or
representative by the Radiologist Assistant, and communication
documented in the PACS or zVision Dashboard.

## 2019-08-24 IMAGING — CR DG CHEST 1V PORT
1 series · 1 of 1 positions shown · non-contrast
Comparison: 09/09/2016.

CLINICAL DATA: ET tube.  Cocaine abuse.  Congestive heart failure.

EXAM:
PORTABLE CHEST 1 VIEW

[AP]
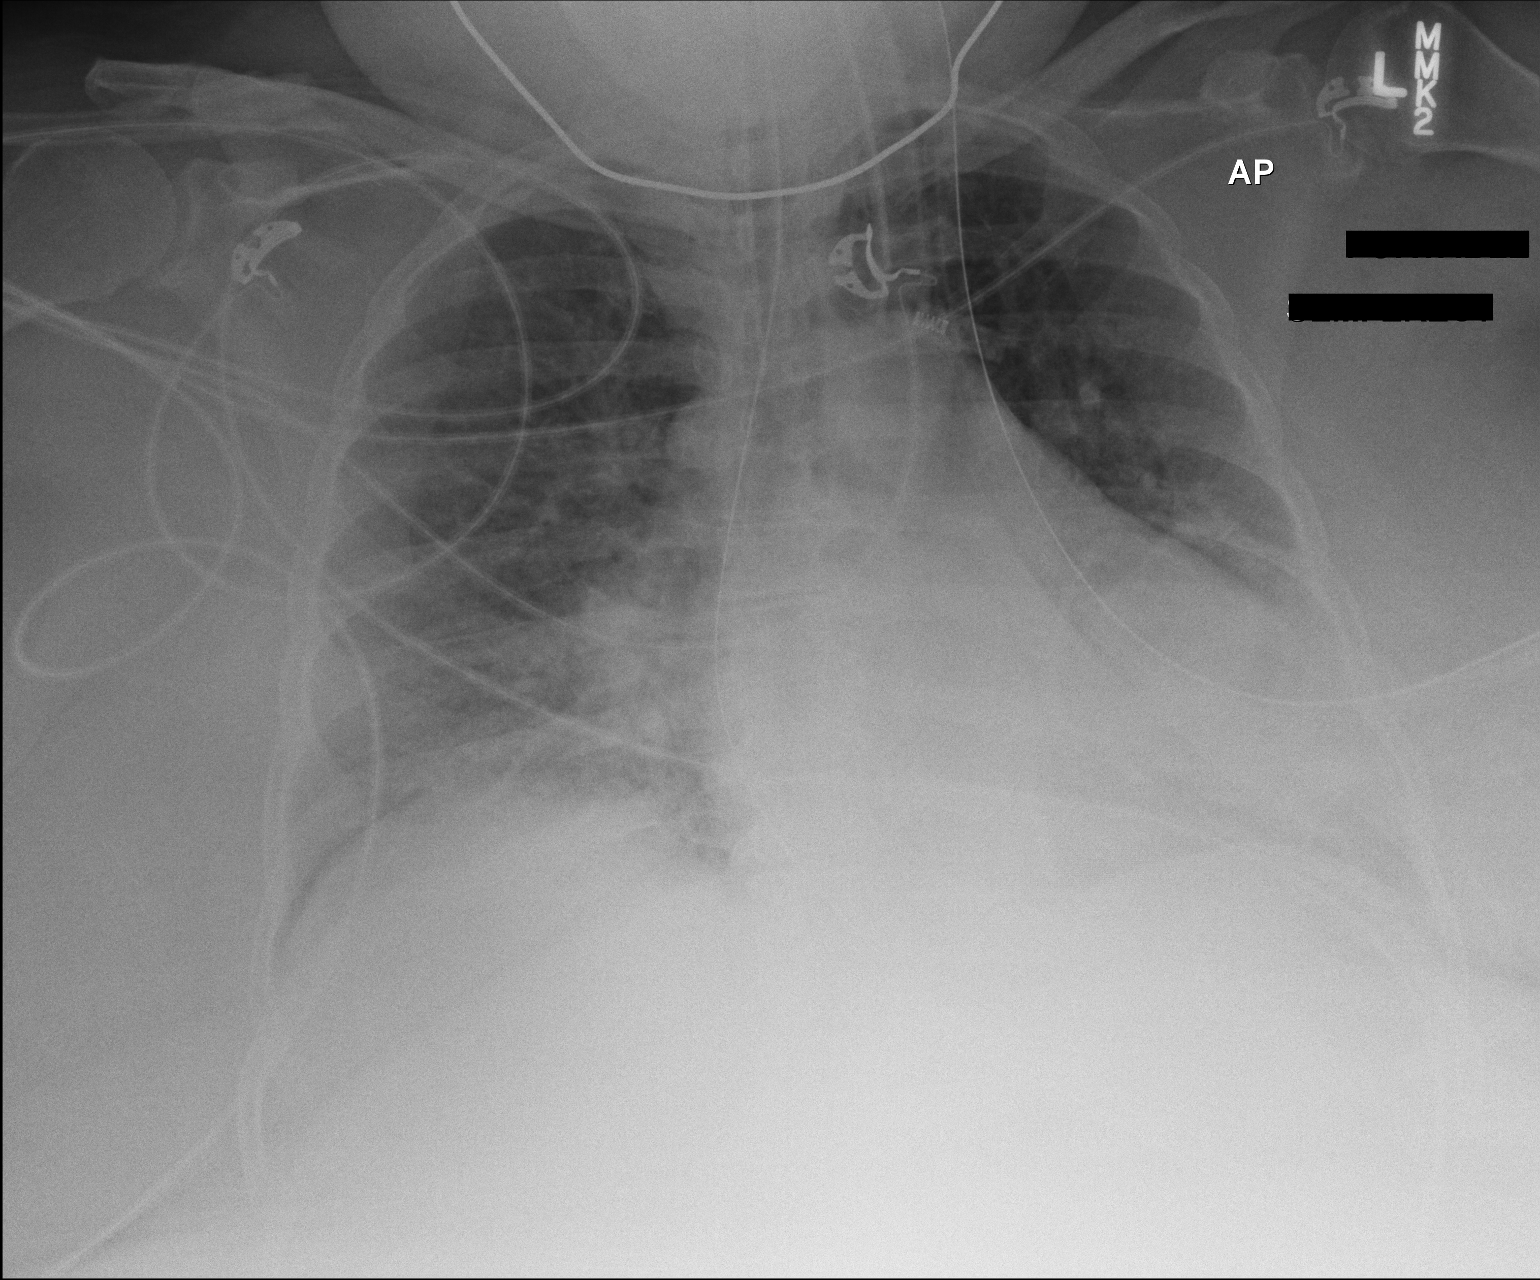

[1 of 1 positions shown; findings below may reference images not displayed]

FINDINGS: Endotracheal tube has been withdrawn and now lies in good position
3.9 cm above the carina. Enteric tube lies in the stomach. The heart
is enlarged. There are diffuse opacities in both lung fields,
increasing from yesterday's radiograph, suggesting congestive heart
failure. No effusion or pneumothorax.
IMPRESSION: Support tubes and lines are in satisfactory position.

Increasing pulmonary opacities suggesting congestive heart failure.

## 2019-08-25 IMAGING — DX DG CHEST 1V PORT
1 series · 1 of 1 positions shown · non-contrast
Comparison: 09/10/2016.  CT 07/14/2016 .

CLINICAL DATA: Shortness of breath.

EXAM:
PORTABLE CHEST 1 VIEW

[chest ap]
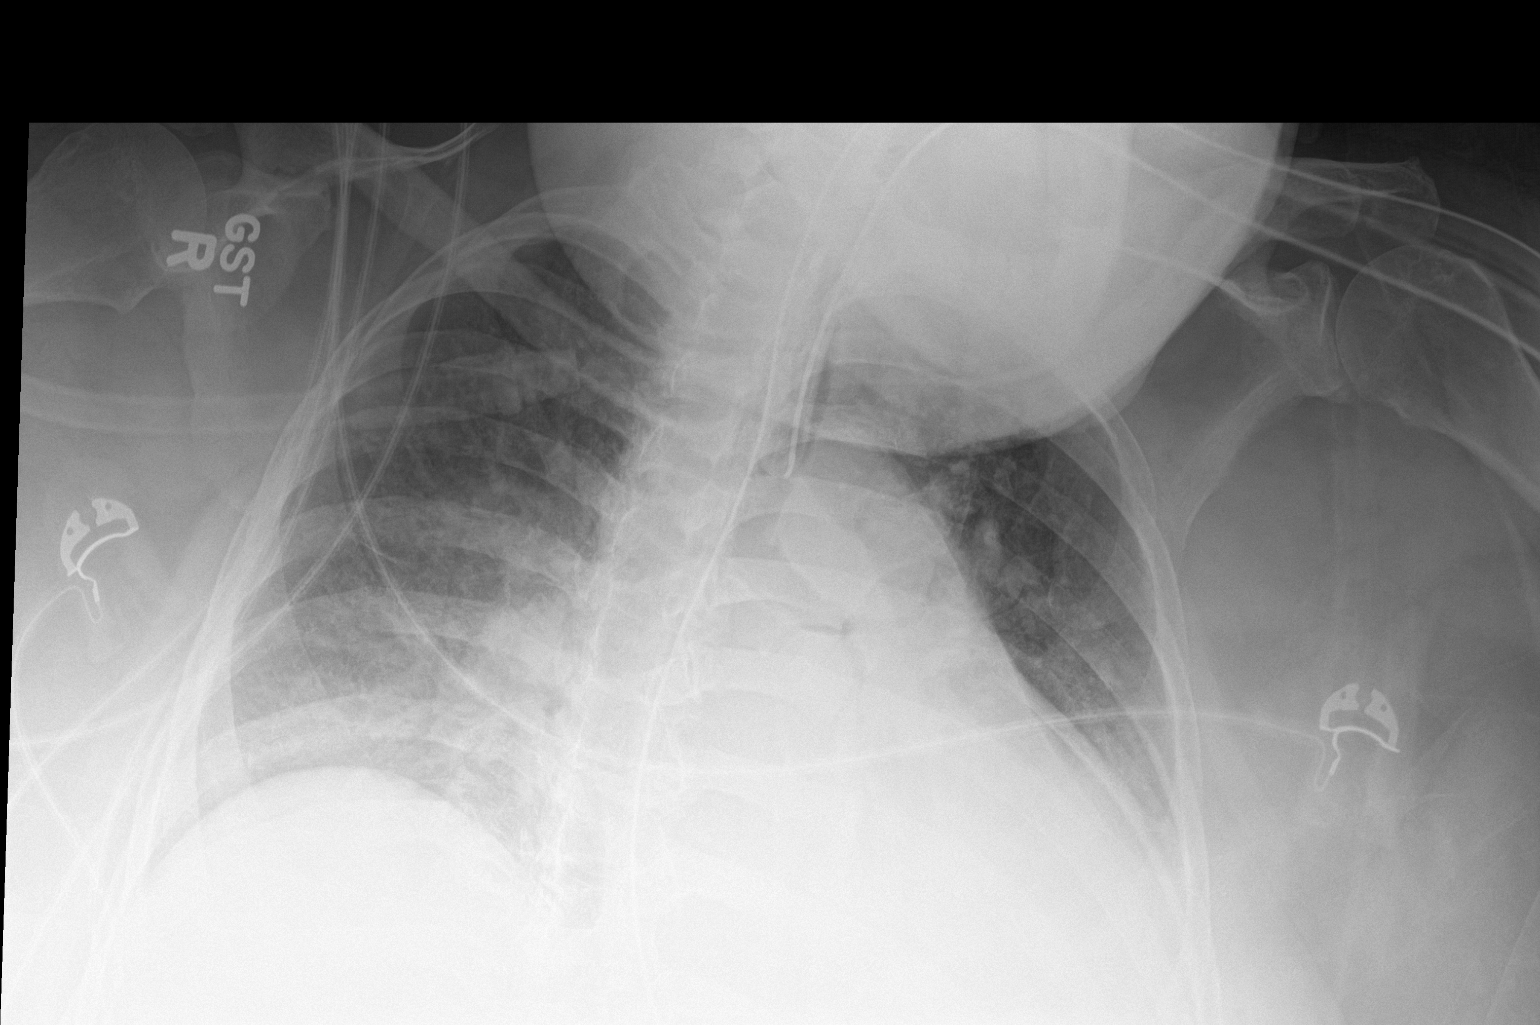

[1 of 1 positions shown; findings below may reference images not displayed]

FINDINGS: Endotracheal tube, NG tube in stable position. Cardiomegaly again
noted. Persistent bilateral pulmonary infiltrates suggesting
pulmonary edema again noted. Bilateral pneumonia cannot be excluded.
Basilar atelectasis. Small left pleural effusion. No significant
interim change.
IMPRESSION: 1. Lines and tubes in stable position.

2. Cardiomegaly with persistent bilateral pulmonary infiltrates
suggesting pulmonary edema again noted. Bilateral pneumonia cannot
be excluded. Small left pleural effusion. No significant change from
prior exam.

3. Basilar atelectasis .

## 2019-08-26 IMAGING — DX DG CHEST 1V PORT
1 series · 1 of 1 positions shown · non-contrast
Comparison: 09/11/2016 .

CLINICAL DATA: Intubation.

EXAM:
PORTABLE CHEST 1 VIEW

[chest ap]
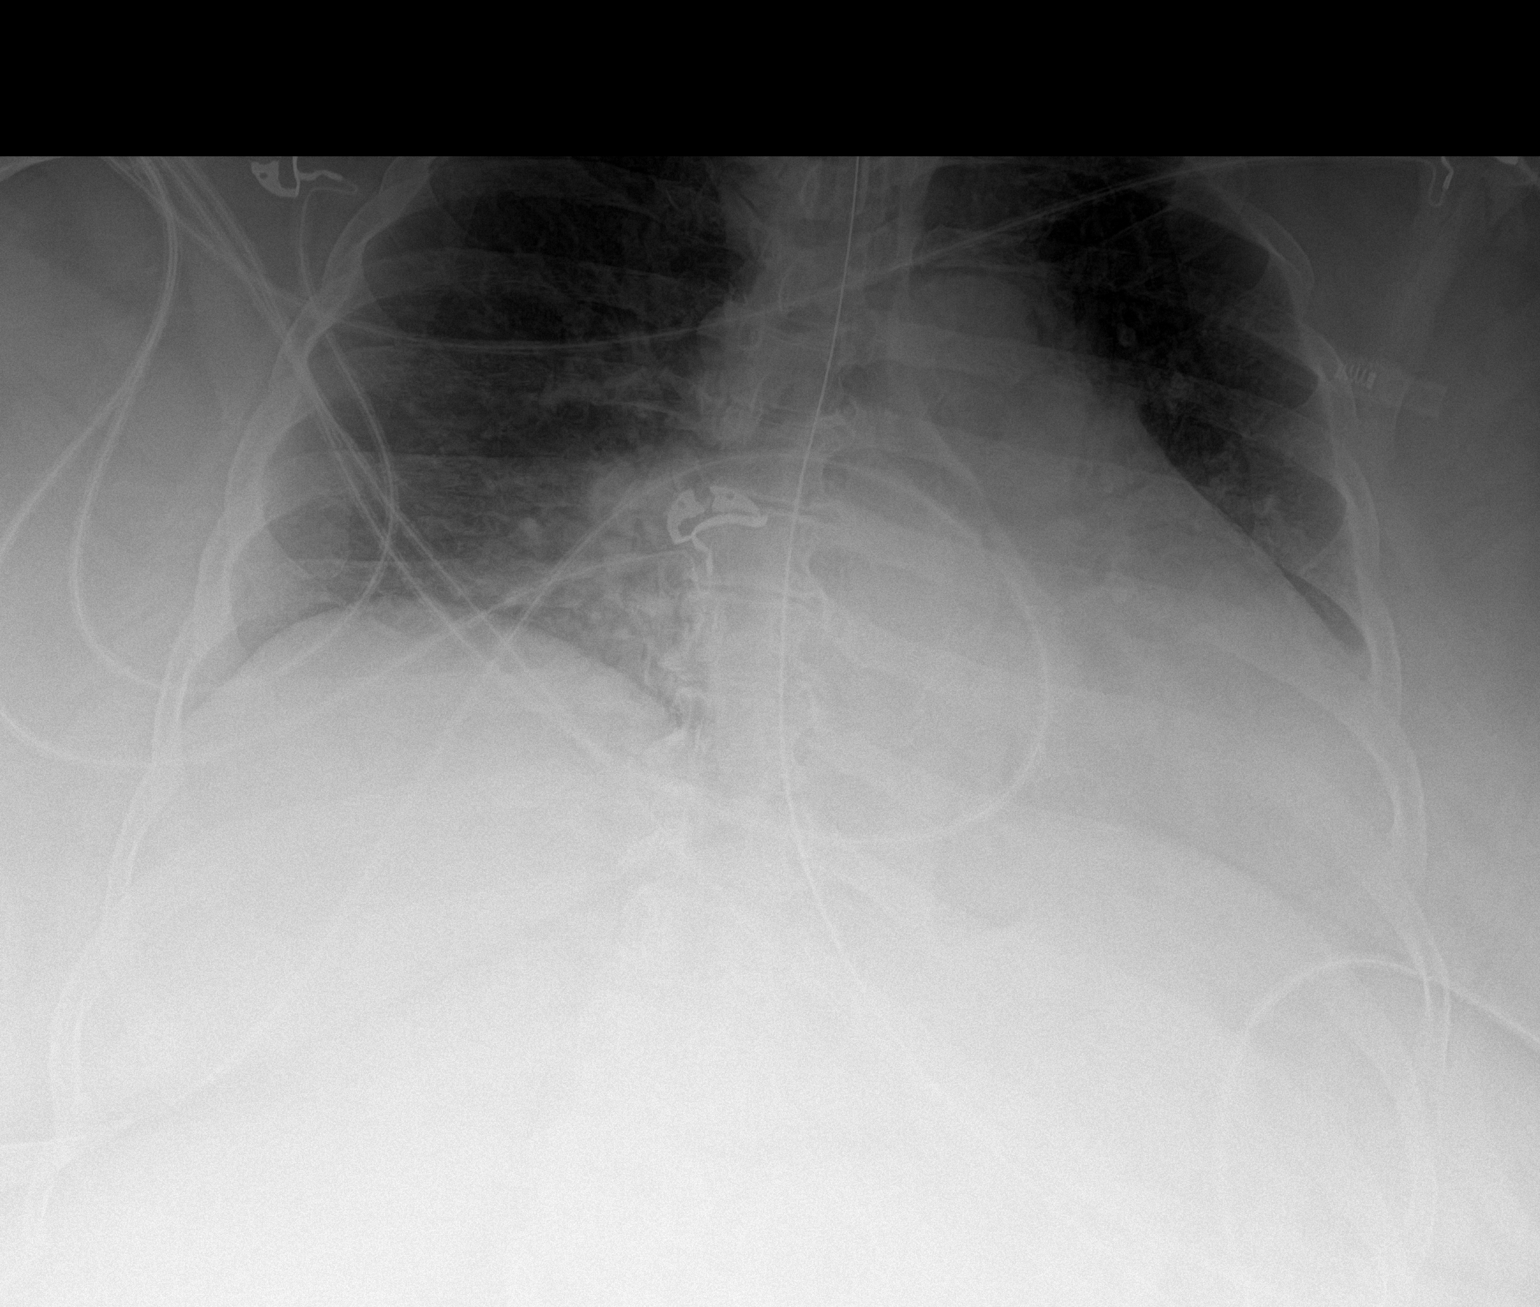

[1 of 1 positions shown; findings below may reference images not displayed]

FINDINGS: Endotracheal tube and NG tube in stable position. Cardiomegaly.
Slight improvement interstitial prominence suggesting partial
clearing of CHF . Again basilar pneumonia cannot be excluded. Small
left pleural effusion again noted. Scratched No pneumothorax .
IMPRESSION: 1. Lines and tubes in stable position.

2. Cardiomegaly. Slight improvement of interstitial prominence
suggesting partial clearing of CHF . Again basilar pneumonia cannot
be excluded

## 2019-08-27 ENCOUNTER — Other Ambulatory Visit (HOSPITAL_COMMUNITY): Payer: Self-pay | Admitting: Psychiatry

## 2019-08-27 IMAGING — CR DG CHEST 1V PORT
1 series · 1 of 1 positions shown · non-contrast
Comparison: 09/12/2016 .

CLINICAL DATA: Shortness of breath.

EXAM:
PORTABLE CHEST 1 VIEW

[AP]
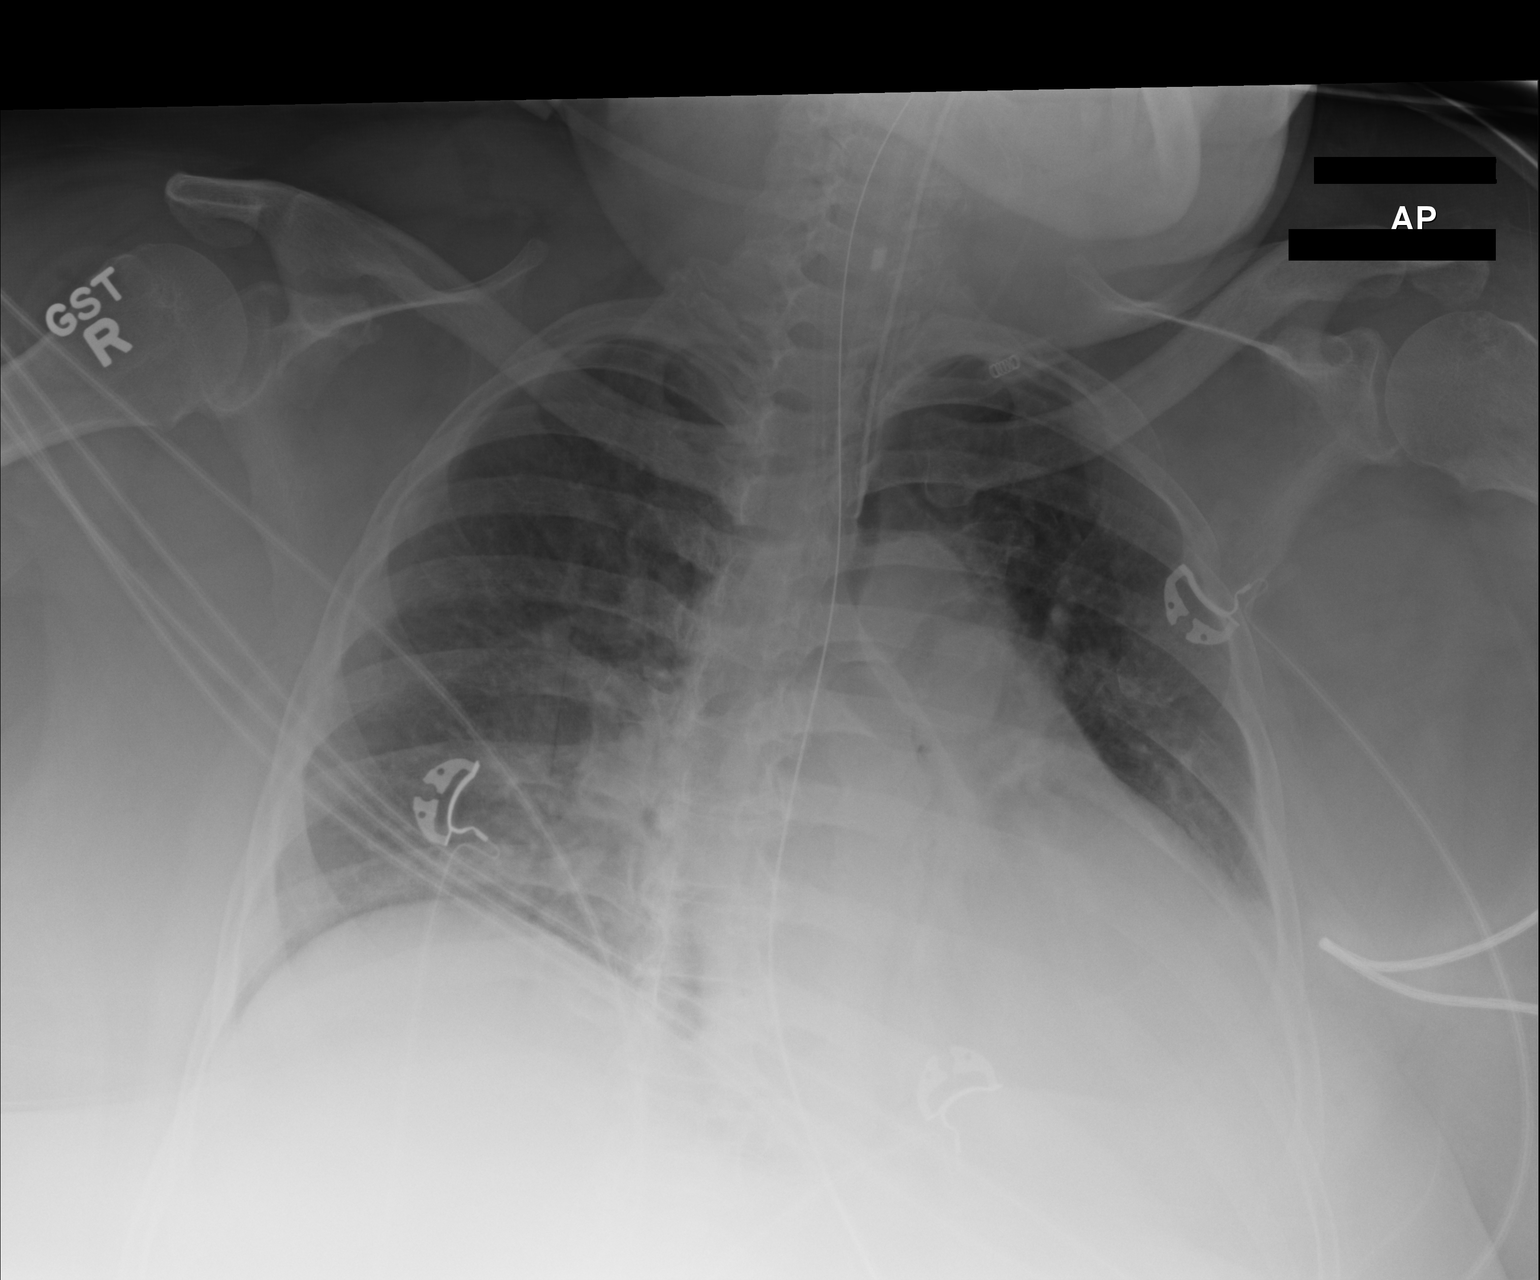

[1 of 1 positions shown; findings below may reference images not displayed]

FINDINGS: Endotracheal tube and NG tube in stable position. Stable
cardiomegaly. Slight progression of basilar atelectasis and
infiltrates. Small left pleural effusion. No pneumothorax P
IMPRESSION: 1.  Lines and tubes in stable position.

2. Slight progression of of basilar atelectasis and infiltrates.
Small left pleural effusion.

3.  Persistent cardiomegaly with mild pulmonary venous congestion.

## 2019-08-28 IMAGING — DX DG CHEST 1V PORT
1 series · 1 of 1 positions shown · non-contrast
Comparison: 09/13/2016 .

CLINICAL DATA: Respiratory failure.

EXAM:
PORTABLE CHEST 1 VIEW

[chest]
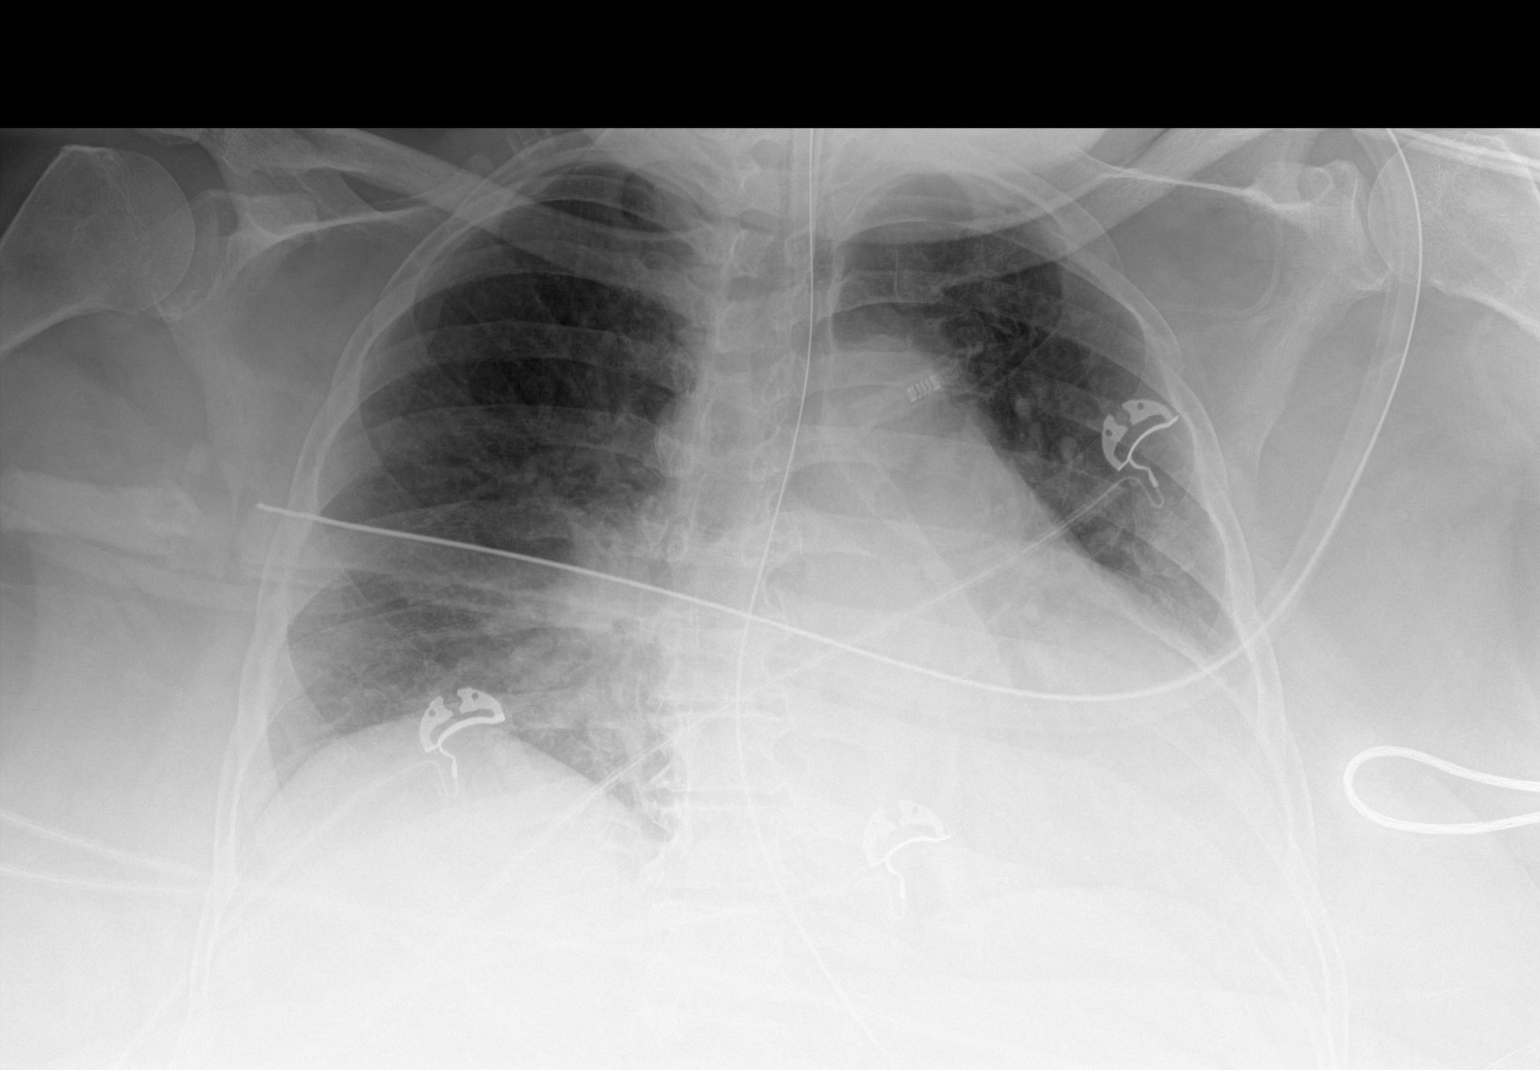

[1 of 1 positions shown; findings below may reference images not displayed]

FINDINGS: Endotracheal tube and NG tube in stable position. Cardiomegaly with
persistent pulmonary venous congestion. Persistent basilar
infiltrates/edema. Small left pleural effusion again noted. No
significant interim change. Low lung volumes. No pneumothorax.
IMPRESSION: 1. Lines and tubes in stable position.

2. Persistent cardiomegaly with persistent basilar
infiltrates/edema. Persistent small left pleural effusion. No
interim change.

3. Low lung volumes.

## 2019-08-29 ENCOUNTER — Telehealth (INDEPENDENT_AMBULATORY_CARE_PROVIDER_SITE_OTHER): Payer: Medicare Other | Admitting: Psychiatry

## 2019-08-29 ENCOUNTER — Other Ambulatory Visit: Payer: Self-pay

## 2019-08-29 DIAGNOSIS — F4310 Post-traumatic stress disorder, unspecified: Secondary | ICD-10-CM

## 2019-08-29 DIAGNOSIS — F5081 Binge eating disorder: Secondary | ICD-10-CM | POA: Diagnosis not present

## 2019-08-29 DIAGNOSIS — F250 Schizoaffective disorder, bipolar type: Secondary | ICD-10-CM

## 2019-08-29 MED ORDER — ASENAPINE MALEATE 10 MG SL SUBL: 10.0000 mg | SUBLINGUAL_TABLET | Freq: Three times a day (TID) | SUBLINGUAL | 2 refills | Status: DC

## 2019-08-29 MED ORDER — PRAZOSIN HCL 5 MG PO CAPS: 5.0000 mg | ORAL_CAPSULE | Freq: Two times a day (BID) | ORAL | 2 refills | Status: DC

## 2019-08-29 MED ORDER — ESCITALOPRAM OXALATE 20 MG PO TABS: 40.0000 mg | ORAL_TABLET | Freq: Every day | ORAL | 0 refills | Status: DC

## 2019-08-29 MED ORDER — TRAZODONE HCL 100 MG PO TABS: 100.0000 mg | ORAL_TABLET | Freq: Every day | ORAL | 0 refills | Status: DC

## 2019-08-29 MED ORDER — LISDEXAMFETAMINE DIMESYLATE 70 MG PO CAPS: 70.0000 mg | ORAL_CAPSULE | Freq: Every day | ORAL | 0 refills | Status: DC

## 2019-08-29 MED ORDER — DIAZEPAM 10 MG PO TABS: 10.0000 mg | ORAL_TABLET | Freq: Three times a day (TID) | ORAL | 2 refills | Status: DC

## 2019-08-29 NOTE — Progress Notes (Signed)
BH MD/PA/NP OP Progress Note  08/29/2019 11:29 AM JADENE STEMMER  MRN:  [id number] Interview was conducted by phone and I verified that I was speaking with the correct person using two identifiers. I discussed the limitations of evaluation and management by telemedicine and  the availability of in person appointments. Patient expressed understanding and agreed to proceed. Patient location - home; physician - home office.  Chief Complaint: "They are not giving me my medications as they are supposed to".  HPI: 59yo divorced AAF with schizoaffective disorder bipolar type(vs paranoid schizophrenia),chronicPTSD,remote hx ofcocaineaddiction (clean for4years)and bingeeating disorder.She tried ziprazidone but developedQTC prolongation on itso it was changed toSaphriswhich she believes is a best medication she has ever been on. [name] also on escitalopram andwas onVyvanse for binge eating disorder. Kalianne used to weigh over 300 lbs and since starting Vyvanse her binge eating is well controlled and she was able to lose a lot of weight). She continues to take diazepam 10 mgtidand trazodone for sleep (100 mg plus 50 mg in am "for nerves").She has a strained relationship with her family - mother in particular, but also son. Dimon moved to [facility] few months ago "to get away from my mother". Unfortunately she has been struggling there. She has been to ED multiple times with various pain complaints. She was also in a psychiatric ward with increased paranoia.  It appears that she has been off her psychotropic meds and has become more depressed and more paranoid than during her last visit here. [name] now in rehab (chronic leg pain, gout). She claims that she has not been getting Vyvanse, prazosin or trazodone there. She has not establish care with any local psychiatrist there as yet and now is telling me that she plans to move away again (possible plans to return to [place]).  [name]  compliant with her medications, does not abuse street drugs.   Visit Diagnosis:    ICD-10-CM   1. Schizoaffective disorder, bipolar type ([place])  F25.0   2. Binge eating disorder  F50.81   3. PTSD (post-traumatic stress disorder)  F43.10     Past Psychiatric History: Please see intake H&P.  Past Medical History:  Past Medical History:  Diagnosis Date  . Agitation 11/22/2017  . Anoxic brain injury ([place]) 09/08/2016   C. Arrest due to respiratory failure and COPD exacerbation  . Anxiety   . Arthritis    "all over" (04/10/2016)  . Asthma 10/18/2010  . Binge eating disorder   . Cardiac arrest ([place]) 09/08/2016   PEA  . Carotid artery stenosis    1-39% bilateral by dopplers 11/2016  . Chronic diastolic (congestive) heart failure ([place])   . Chronic kidney disease    "I see a kidney dr." (04/10/2016)  . Chronic pain syndrome 06/18/2012  . Chronic post-traumatic stress disorder (PTSD) 05/27/2018  . Chronic respiratory failure with hypoxia and hypercapnia (HCC) 06/22/2015   TRILOGY Vent >AVAPA-ES., Vt target 200-400, Max P 30 , PS max 20 , PS min 6-10 , E Max 6, E Min 4, Rate Auto AVAPS Rate 2 (titrate for pt comfort) , bleed O2 at 5l/m continuous flow .   [name] CKD (chronic kidney disease) stage 3, GFR 30-59 ml/min 12/15/2016  . Closed displaced fracture of fifth metacarpal bone 03/21/2018  . Cocaine use disorder, severe, in sustained remission ([place]) 12/17/2015  . Complication of anesthesia    decreased bp, decreased heart rate  . COPD (chronic obstructive pulmonary disease) ([place]) 07/08/2014  . Depression   .  Diabetic neuropathy ([place]) 04/24/2011  . Difficulty with speech 01/24/2018  . Disorder of nervous system   . Drug abuse ([place]) 11/21/2017  . Dyslipidemia 04/24/2011  . Elevated troponin 04/28/2012  . Emphysema   . Encephalopathy 11/21/2017  . Essential hypertension 03/22/2016  . Fibula fracture 07/10/2016  . Frequent falls 10/11/2017  . GERD (gastroesophageal reflux disease)   . Gout 04/11/2017  . Heart  attack ([place]) 1980s  . History of blood transfusion 1994   "couldn't stop bleeding from my period"  . History of drug abuse in remission ([place]) 11/28/2015   Quit in 2017  . Hyperlipidemia LDL goal <70   . Incontinence   . Manic depression ([place])   . Morbid obesity ([place]) 10/18/2010  . Obstructive sleep apnea 10/18/2010  . On home oxygen therapy    "6L; 24/7" (04/10/2016)  . OSA on CPAP    "wear mask sometimes" (04/10/2016)  . Paranoid ([place])    "sometimes; I'm on RX for it" (04/10/2016)  . Prolonged Q-T interval on ECG   . Rectal bleeding 12/31/2015  . Right carotid bruit 11/09/2016  . Schizoaffective disorder, bipolar type ([place]) 04/05/2018  . Seasonal allergies   . Seborrheic keratoses 12/31/2013  . Seizures ([place])    "don't know what kind; last one was ~ 1 yr ago" (04/10/2016)  . Stroke [facility]) 1980s   denies residual on 04/10/2016  . Thrush 09/19/2013  . Type 2 diabetes mellitus ([place]) 10/18/2010    Past Surgical History:  Procedure Laterality Date  . CESAREAN SECTION  1997  . HERNIA REPAIR    . IR CHOLANGIOGRAM EXISTING TUBE  07/20/2016  . IR PERC CHOLECYSTOSTOMY  05/10/2016  . IR RADIOLOGIST EVAL & MGMT  06/08/2016  . IR RADIOLOGIST EVAL & MGMT  06/29/2016  . IR SINUS/FIST TUBE CHK-NON GI  07/12/2016  . RIGHT/LEFT HEART CATH AND CORONARY ANGIOGRAPHY N/A 06/19/2017   Procedure: RIGHT/LEFT HEART CATH AND CORONARY ANGIOGRAPHY;  Surgeon: Jolaine Artist, MD;  Location: [facility];  Service: Cardiovascular;  Laterality: N/A;  . TIBIA IM NAIL INSERTION Right 07/12/2016   Procedure: INTRAMEDULLARY (IM) NAIL RIGHT TIBIA;  Surgeon: Leandrew Koyanagi, MD;  Location: [facility];  Service: Orthopedics;  Laterality: Right;  . UMBILICAL HERNIA REPAIR  ~ 1963   "that's why I don't have a belly button"  . VAGINAL HYSTERECTOMY      Family Psychiatric History: Reviewed.  Family History:  Family History  Problem Relation Age of Onset  . Cancer Father        prostate  . Cancer Mother        lung  .  Depression Mother   . Depression Sister   . Anxiety disorder Sister   . Schizophrenia Sister   . Bipolar disorder Sister   . Depression Sister   . Depression Brother   . Heart failure Other        cousin    Social History:  Social History   Socioeconomic History  . Marital status: Widowed    Spouse name: Not on file  . Number of children: 3  . Years of education: Not on file  . Highest education level: Not on file  Occupational History  . Occupation: disabled    Comment: factory production  Tobacco Use  . Smoking status: Former Smoker    Packs/day: 1.50    Years: 38.00    Pack years: 57.00    Types: Cigarettes    Start date: 03/13/1977    Quit  date: 04/10/2016    Years since quitting: 3.3  . Smokeless tobacco: Never Used  Vaping Use  . Vaping Use: Never used  Substance and Sexual Activity  . Alcohol use: No    Alcohol/week: 0.0 standard drinks  . Drug use: No    Types: Cocaine    Comment: 04/10/2016 "last used cocaine back in November 2017"  . Sexual activity: Not Currently    Birth control/protection: Surgical  Other Topics Concern  . Not on file  Social History Narrative   Has 1 son, [name]   Lives with son and his boyfriend   Her house has ramps and handrails should she ever needs them.    Her mother lives down the street from her and is a good support person in addition to her son.   She drives herself, has private transportation.    Cocaine free since 02/24/16, smoke free since 04/10/16   Social Determinants of Health   Financial Resource Strain:   . Difficulty of Paying Living Expenses:   Food Insecurity:   . Worried About Charity fundraiser in the Last Year:   . Arboriculturist in the Last Year:   Transportation Needs:   . Film/video editor (Medical):   [name] Lack of Transportation (Non-Medical):   Physical Activity:   . Days of Exercise per Week:   . Minutes of Exercise per Session:   Stress:   . Feeling of Stress :   Social Connections:   .  Frequency of Communication with Friends and Family:   . Frequency of Social Gatherings with Friends and Family:   . Attends Religious Services:   . Active Member of Clubs or Organizations:   . Attends Archivist Meetings:   [name] Marital Status:     Allergies:  Allergies  Allergen Reactions  . Hydrocodone Shortness Of Breath  . Hydrocodone-Acetaminophen Shortness Of Breath  . Hydroxyzine Anaphylaxis and Shortness Of Breath  . Latuda [Lurasidone Hcl] Anaphylaxis  . Lurasidone Anaphylaxis  . Magnesium-Containing Compounds Anaphylaxis  . Prednisone Anaphylaxis, Swelling and Other (See Comments)    Tongue swelling  . Tramadol Anaphylaxis and Swelling  . Codeine Nausea And Vomiting  . Other Rash  . Sulfa Antibiotics Itching  . Tape Rash    Metabolic Disorder Labs: Lab Results  Component Value Date   HGBA1C 5.6 11/08/2018   MPG 125.5 01/07/2017   MPG 111.15 11/03/2016   No results found for: PROLACTIN Lab Results  Component Value Date   CHOL 127 08/05/2018   TRIG 53 08/05/2018   HDL 62 08/05/2018   CHOLHDL 2.0 08/05/2018   VLDL 38 01/07/2017   LDLCALC 54 08/05/2018   LDLCALC 56 01/07/2017   Lab Results  Component Value Date   TSH 0.563 03/15/2016   TSH 1.060 12/04/2015    Therapeutic Level Labs: No results found for: LITHIUM Lab Results  Component Value Date   VALPROATE 20 (L) 09/09/2016   VALPROATE 46 (L) 07/10/2016   No components found for:  CBMZ  Current Medications: Current Outpatient Medications  Medication Sig Dispense Refill  . albuterol (PROVENTIL) (2.5 MG/3ML) 0.083% nebulizer solution Take 3 mLs (2.5 mg total) by nebulization every 6 (six) hours as needed for wheezing or shortness of breath. 75 mL 5  . albuterol (VENTOLIN HFA) 108 (90 Base) MCG/ACT inhaler Inhale 2 puffs into the lungs every 6 (six) hours as needed for wheezing or shortness of breath. 18 g 2  . amLODipine (NORVASC) 10 MG tablet  TAKE 1 TABLET (5 MG TOTAL) BY MOUTH EVERY  MORNING 90 tablet 0  . Asenapine Maleate 10 MG SUBL Place 1 tablet (10 mg total) under the tongue in the morning, at noon, and at bedtime. 90 tablet 2  . aspirin (GOODSENSE ASPIRIN LOW DOSE) 81 MG EC tablet TAKE 1 TABLET (81 MG TOTAL) BY MOUTH DAILY (MORNING). 90 tablet 3  . atorvastatin (LIPITOR) 80 MG tablet Take 1 tablet (80 mg total) by mouth daily. 90 tablet 0  . budesonide-formoterol (SYMBICORT) 80-4.5 MCG/ACT inhaler Inhale 2 puffs into the lungs 2 (two) times daily. 1 Inhaler 2  . colchicine 0.6 MG tablet TAKE 1 TABLET 2 (TWO) TIMES DAILY. TAKE 1 TABLET (0.6 MG TOTAL) BY MOUTH 2 (TWO) TIMES DAILY. 60 tablet 0  . diazepam (VALIUM) 10 MG tablet Take 1 tablet (10 mg total) by mouth 3 (three) times daily. 90 tablet 2  . diclofenac (VOLTAREN) 75 MG EC tablet Take 1 tablet (75 mg total) by mouth 2 (two) times daily. 60 tablet 1  . Diclofenac Sodium 3 % GEL Apply 1 application topically daily as needed. 100 g 0  . doxazosin (CARDURA) 2 MG tablet Take 1 tablet (2 mg total) by mouth daily. 30 tablet 0  . Efinaconazole 10 % SOLN Apply 1 application topically daily. (Patient not taking: Reported on 05/14/2019) 8 mL 0  . escitalopram (LEXAPRO) 20 MG tablet Take 2 tablets (40 mg total) by mouth daily. 180 tablet 0  . febuxostat (ULORIC) 40 MG tablet Take 1 tablet (40 mg total) by mouth daily. 30 tablet 0  . ferrous sulfate 325 (65 FE) MG tablet Take 1 tablet (325 mg total) by mouth daily with breakfast. 90 tablet 1  . Fluticasone-Umeclidin-Vilant (TRELEGY ELLIPTA) 100-62.5-25 MCG/INH AEPB Inhale 1 puff into the lungs daily. 60 each 3  . gabapentin (NEURONTIN) 600 MG tablet Take 1 tablet (600 mg total) by mouth 2 (two) times daily. 180 tablet 1  . lisdexamfetamine (VYVANSE) 70 MG capsule Take 1 capsule (70 mg total) by mouth daily before breakfast. 30 capsule 0  . methocarbamol (ROBAXIN) 500 MG tablet Take 500 mg by mouth 2 (two) times daily as needed.    . metolazone (ZAROXOLYN) 2.5 MG tablet Take 1  tablet (2.5 mg total) by mouth daily. 90 tablet 1  . montelukast (SINGULAIR) 10 MG tablet Take 10 mg by mouth at bedtime.    . Multiple Vitamin (MULTIVITAMIN WITH MINERALS) TABS tablet Take 1 tablet by mouth daily.    [name] omeprazole (PRILOSEC) 40 MG capsule Take 1 capsule (40 mg total) by mouth 2 (two) times daily. 180 capsule 3  . oxybutynin (DITROPAN-XL) 10 MG 24 hr tablet Take 1-2 tablets (10-20 mg total) by mouth at bedtime. (Patient taking differently: Take 10 mg by mouth 2 (two) times daily. ) 60 tablet 1  . potassium chloride SA (KLOR-CON) 20 MEQ tablet Take 2 tablets (40 mEq total) by mouth 2 (two) times daily. 360 tablet 1  . prazosin (MINIPRESS) 5 MG capsule Take 1 capsule (5 mg total) by mouth 2 (two) times daily. 60 capsule 2  . terbinafine (LAMISIL) 250 MG tablet Take 1 tablet (250 mg total) by mouth daily. 84 tablet 0  . traZODone (DESYREL) 100 MG tablet Take 1 tablet (100 mg total) by mouth at bedtime. 90 tablet 0  . traZODone (DESYREL) 50 MG tablet Take 1 tablet (50 mg total) by mouth daily. 90 tablet 0   No current facility-administered medications for this visit.  Psychiatric Specialty Exam: Review of Systems  Constitutional: Positive for appetite change.  Musculoskeletal: Positive for arthralgias and gait problem.  Psychiatric/Behavioral: The patient is nervous/anxious.   All other systems reviewed and are negative.   There were no vitals taken for this visit.There is no height or weight on file to calculate BMI.  General Appearance: NA  Eye Contact:  NA  Speech:  Clear and Coherent and Normal Rate  Volume:  Normal  Mood:  Depressed  Affect:  NA  Thought Process:  Goal Directed  Orientation:  Full (Time, Place, and Person)  Thought Content: Paranoid Ideation   Suicidal Thoughts:  No  Homicidal Thoughts:  No  Memory:  Immediate;   Good Recent;   Good Remote;   Good  Judgement:  Fair  Insight:  Fair  Psychomotor Activity:  NA  Concentration:  Concentration:  Good  Recall:  Good  Fund of Knowledge: Good  Language: Good  Akathisia:  Negative  Handed:  Right  AIMS (if indicated): not done  Assets:  Desire for Improvement Financial Resources/Insurance Housing Resilience  ADL's:  Intact  Cognition: WNL  Sleep:  Fair   Screenings: AUDIT     Admission (Discharged) from 12/01/2015 in [facility]  Alcohol Use Disorder Identification Test Final Score (AUDIT) 0    GAD-7     Office Visit from 03/21/2018 in [place] for [facility]  Total GAD-7 Score 4    PHQ2-9     Office Visit from confidential encounter on 12/11/2018 Office Visit from confidential encounter on 12/10/2018 Office Visit from confidential encounter on 11/18/2018 Office Visit from confidential encounter on 11/08/2018 Telemedicine from confidential encounter on 10/25/2018  PHQ-2 Total Score 0 4 2 1 4   PHQ-9 Total Score -- 10 8 1 16        Assessment and Plan: 59yo divorced AAF with schizoaffective disorder bipolar type(vs paranoid schizophrenia),chronicPTSD,remote hx ofcocaineaddiction (clean for4years)and bingeeating disorder.She tried ziprazidone but developedQTC prolongation on itso it was changed toSaphriswhich she believes is a best medication she has ever been on. [name] also on escitalopram andwas onVyvanse for binge eating disorder. [place]cla used to weigh over 300 lbs and since starting Vyvanse her binge eating is well controlled and she was able to lose a lot of weight). She continues to take diazepam 10 mgtidand trazodone for sleep (100 mg plus 50 mg in am "for nerves").She has a strained relationship with her family - mother in particular, but also son. Kolodziej moved to [facility] few months ago "to get away from my mother". Unfortunately she has been struggling there. She has been to ED multiple times with various pain complaints. She was also in a psychiatric ward with increased paranoia.  It appears that she has  been off her psychotropic meds and has become more depressed and more paranoid than during her last visit here. [name] now in rehab (chronic leg pain, gout). She claims that she has not been getting Vyvanse, prazosin or trazodone there. She has not establish care with any local psychiatrist there as yet and now is telling me that she plans to move away again (possible plans to return to [place]).  [name] compliant with her medications, does not abuse street drugs.  Dx: Schizoaffective disorder bipolar type; PTSD chronic  Plan: Continue current meds: Lexapro 40 mg, trazodone,Saphristo 10 mg tid,diazepam,Vyvanse,prazosin 5 mg bid.She now tells me that [facility] [place].Next appointment in5weeks. The plan was discussed with  patient who had an opportunity to ask questions and these were all answered.I spend54minutes in phone consultation with the patient.   Stephanie Acre, MD 08/29/2019, 11:29 AM

## 2019-09-01 ENCOUNTER — Telehealth: Payer: Self-pay | Admitting: Emergency Medicine

## 2019-09-01 ENCOUNTER — Telehealth (HOSPITAL_COMMUNITY): Payer: Self-pay

## 2019-09-01 ENCOUNTER — Other Ambulatory Visit (HOSPITAL_COMMUNITY): Payer: Self-pay | Admitting: Psychiatry

## 2019-09-01 MED ORDER — LISDEXAMFETAMINE DIMESYLATE 70 MG PO CAPS: 70.0000 mg | ORAL_CAPSULE | Freq: Every day | ORAL | 0 refills | Status: DC

## 2019-09-01 MED ORDER — DIAZEPAM 10 MG PO TABS: 10.0000 mg | ORAL_TABLET | Freq: Three times a day (TID) | ORAL | 2 refills | Status: DC

## 2019-09-01 NOTE — Telephone Encounter (Signed)
Patient contacted with number for [employer] for repair of her POC. Patient understands she will need to contact them for repair or replacement of there POC.

## 2019-09-01 NOTE — Telephone Encounter (Signed)
Please send in a new script for patient's Diazepam 10mg  [facility] [address] [place]. Thank you.

## 2019-09-01 NOTE — Telephone Encounter (Signed)
Done

## 2019-09-01 NOTE — Telephone Encounter (Signed)
Patient called and she's back in [place] now. She needs a new script sent in for her  [facility]se 70mg  [facility] [address] [place]. Thank you.

## 2019-09-02 ENCOUNTER — Other Ambulatory Visit (HOSPITAL_COMMUNITY): Payer: Self-pay

## 2019-09-02 MED ORDER — TRAZODONE HCL 50 MG PO TABS: 50.0000 mg | ORAL_TABLET | Freq: Every day | ORAL | 0 refills | Status: DC

## 2019-09-03 IMAGING — DX DG ANKLE COMPLETE 3+V*L*
3 series · 3 of 3 positions shown · non-contrast
Comparison: None.

CLINICAL DATA: Fall today.  Ankle pain

EXAM:
LEFT ANKLE COMPLETE - 3+ VIEW

[ankle obl]
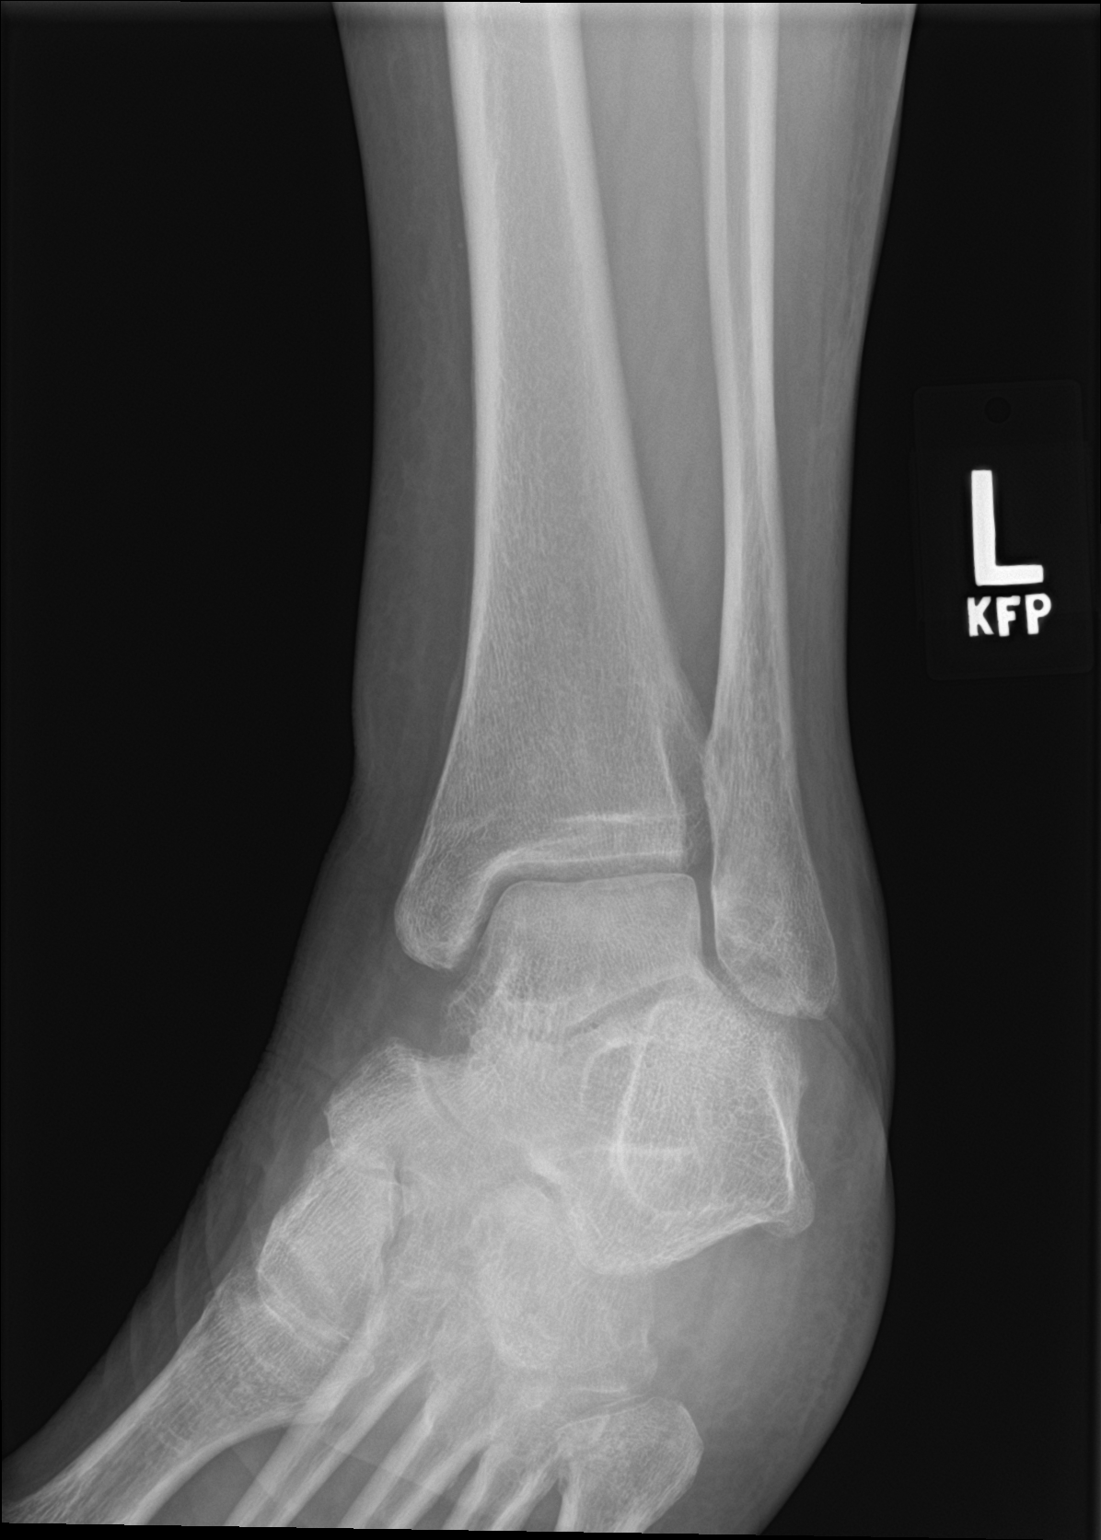

[ankle lat]
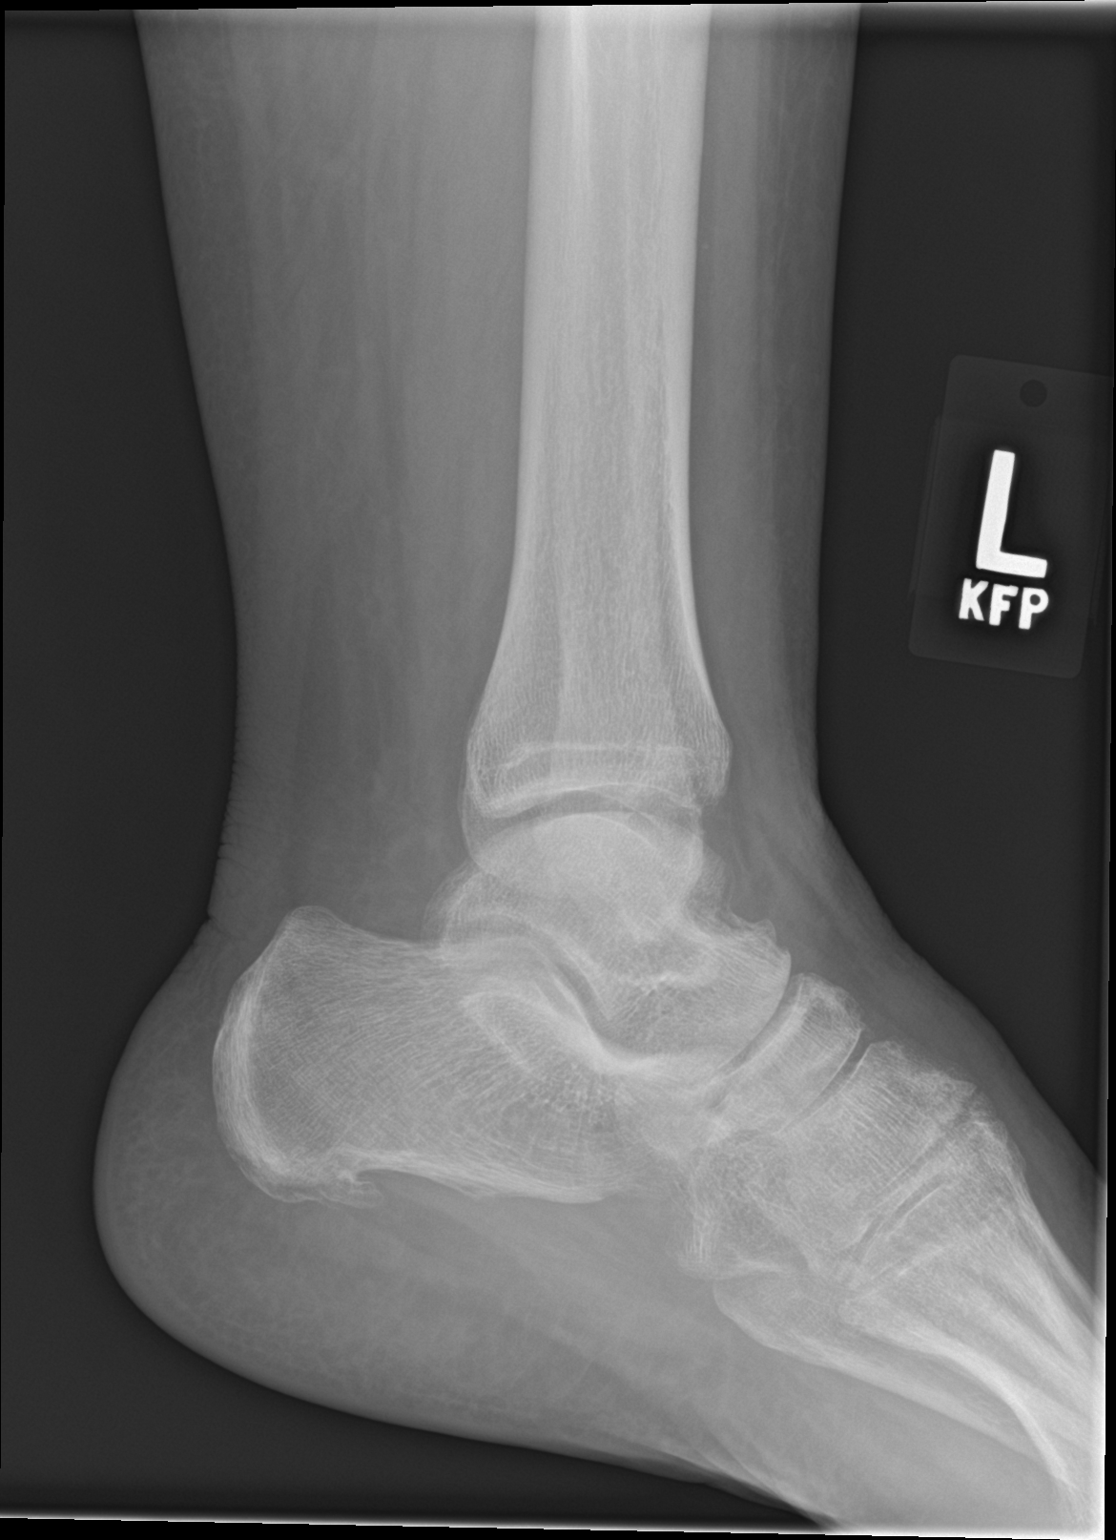

[ankle ap]
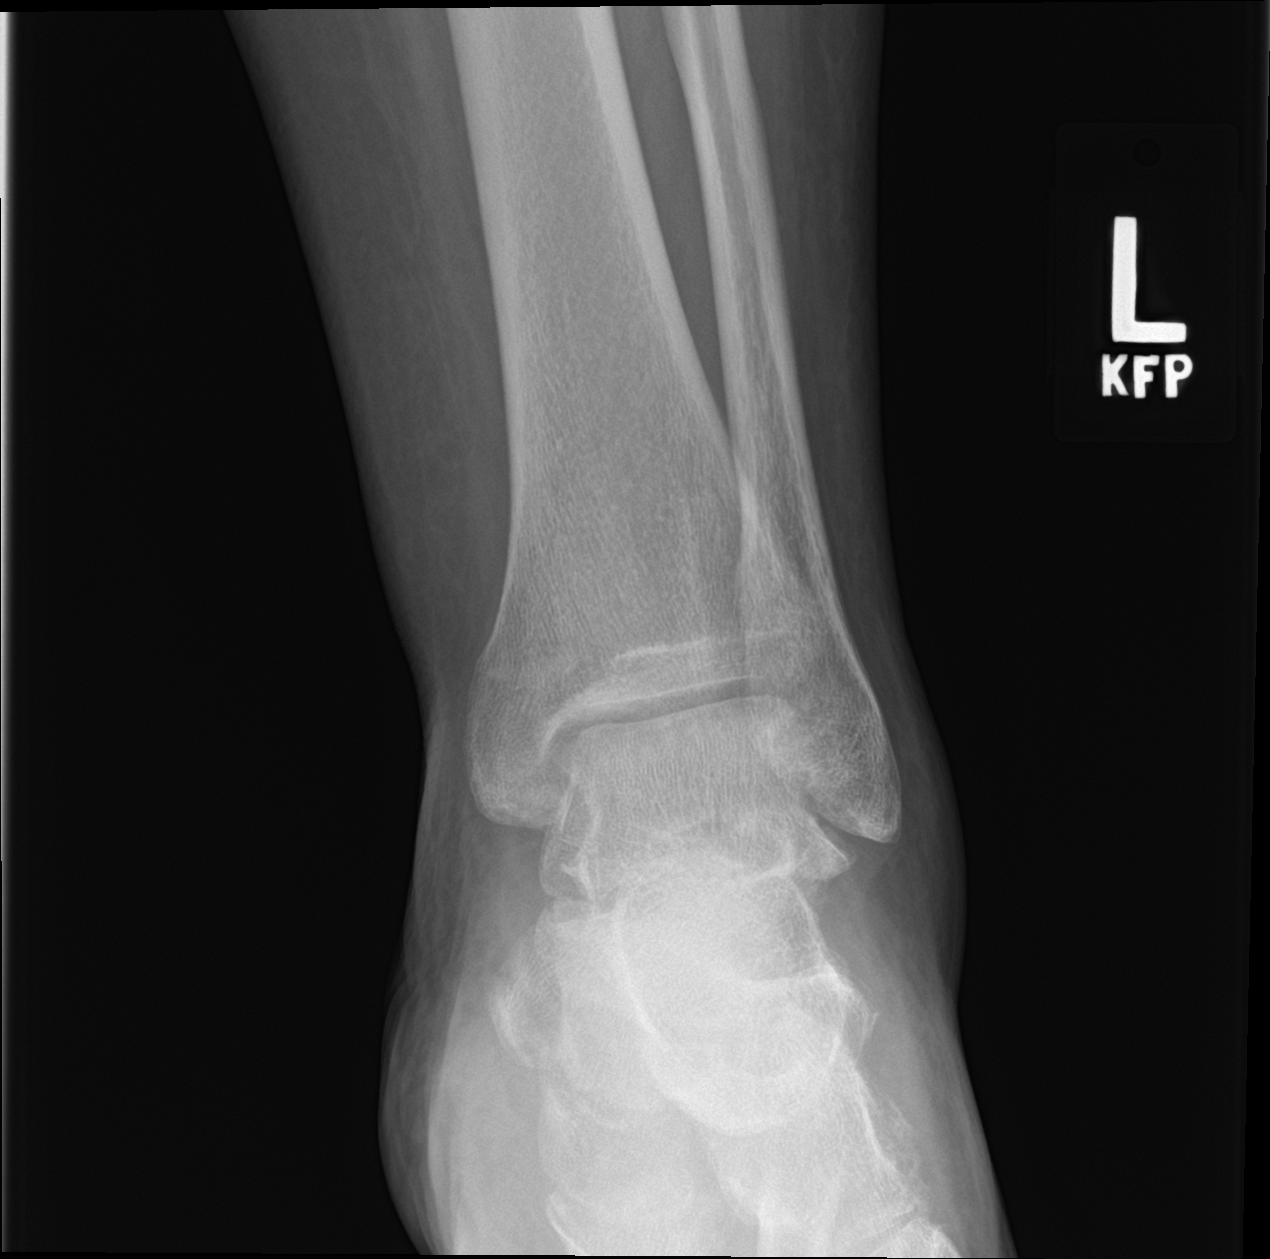

[3 of 3 positions shown; findings below may reference images not displayed]

FINDINGS: Negative for fracture or dislocation. Mild degenerative change in
the ankle joint. No joint effusion. Mild calcaneal spurring.
IMPRESSION: Negative for fracture

## 2019-09-08 ENCOUNTER — Encounter (HOSPITAL_COMMUNITY): Payer: Self-pay

## 2019-09-08 ENCOUNTER — Telehealth: Payer: Self-pay

## 2019-09-08 ENCOUNTER — Telehealth (INDEPENDENT_AMBULATORY_CARE_PROVIDER_SITE_OTHER): Payer: Medicare Other | Admitting: Internal Medicine

## 2019-09-08 ENCOUNTER — Encounter: Payer: Self-pay | Admitting: Internal Medicine

## 2019-09-08 DIAGNOSIS — M109 Gout, unspecified: Secondary | ICD-10-CM

## 2019-09-08 DIAGNOSIS — M545 Low back pain: Secondary | ICD-10-CM

## 2019-09-08 DIAGNOSIS — Z599 Problem related to housing and economic circumstances, unspecified: Secondary | ICD-10-CM | POA: Diagnosis not present

## 2019-09-08 DIAGNOSIS — M79605 Pain in left leg: Secondary | ICD-10-CM

## 2019-09-08 DIAGNOSIS — M79604 Pain in right leg: Secondary | ICD-10-CM

## 2019-09-08 DIAGNOSIS — D649 Anemia, unspecified: Secondary | ICD-10-CM

## 2019-09-08 DIAGNOSIS — R5381 Other malaise: Secondary | ICD-10-CM

## 2019-09-08 DIAGNOSIS — I10 Essential (primary) hypertension: Secondary | ICD-10-CM

## 2019-09-08 DIAGNOSIS — Z59819 Housing instability, housed unspecified: Secondary | ICD-10-CM

## 2019-09-08 DIAGNOSIS — N3281 Overactive bladder: Secondary | ICD-10-CM

## 2019-09-08 MED ORDER — COLCHICINE 0.6 MG PO TABS: 0.6000 mg | ORAL_TABLET | Freq: Two times a day (BID) | ORAL | 0 refills | Status: DC

## 2019-09-08 MED ORDER — METOLAZONE 2.5 MG PO TABS
2.5000 mg | ORAL_TABLET | Freq: Three times a day (TID) | ORAL | 0 refills | Status: DC
Start: 2019-09-08 — End: 2019-11-02

## 2019-09-08 MED ORDER — OXYBUTYNIN CHLORIDE ER 10 MG PO TB24: 10.0000 mg | ORAL_TABLET | Freq: Every day | ORAL | 0 refills | Status: DC

## 2019-09-08 MED ORDER — FERROUS SULFATE 325 (65 FE) MG PO TABS: 325.0000 mg | ORAL_TABLET | Freq: Two times a day (BID) | ORAL | 0 refills | Status: DC

## 2019-09-08 MED ORDER — AMLODIPINE BESYLATE 10 MG PO TABS: 10.0000 mg | ORAL_TABLET | Freq: Every day | ORAL | 0 refills | Status: DC

## 2019-09-08 MED ORDER — OMEPRAZOLE 40 MG PO CPDR: 40.0000 mg | DELAYED_RELEASE_CAPSULE | Freq: Two times a day (BID) | ORAL | 0 refills | Status: DC

## 2019-09-08 NOTE — Progress Notes (Signed)
Virtual Visit via Telephone Note  I connected with Tiffany [name], on 09/08/2019 at 11:10 AM by telephone due to the COVID-19 pandemic and verified that I am speaking with the correct person using two identifiers.   Consent: I discussed the limitations, risks, security and privacy concerns of performing an evaluation and management service by telephone and the availability of in person appointments. I also discussed with the patient that there may be a patient responsible charge related to this service. The patient expressed understanding and agreed to proceed.   Location of Patient: Home   Location of Provider: Clinic    Persons participating in Telemedicine visit: Marcine Gadway [facility] Dr. Juleen China      History of Present Illness: Patient has a visit for concern about back and leg pain. Patient reports that since she was last seen at this clinic, she was hospitalized in [place] in [place]. Has trouble with walking and was told that she had a pinched nerve in her back that was causing radiation of pain down her legs bilaterally. Says that she had MRI of her lumbar spine done.   Asks for an electric wheelchair due to having a lot of difficulty walking. She has a had a few falls due to legs giving out on her. Suffers from chronic pain and instability in lower extremities. She has used an IT trainer wheelchair in the past.    Past Medical History:  Diagnosis Date  . Agitation 11/22/2017  . Anoxic brain injury ([place]) 09/08/2016   C. Arrest due to respiratory failure and COPD exacerbation  . Anxiety   . Arthritis    "all over" (04/10/2016)  . Asthma 10/18/2010  . Binge eating disorder   . Cardiac arrest ([place]) 09/08/2016   PEA  . Carotid artery stenosis    1-39% bilateral by dopplers 11/2016  . Chronic diastolic (congestive) heart failure ([place])   . Chronic kidney disease    "I see a kidney dr." (04/10/2016)  . Chronic pain syndrome 06/18/2012  . Chronic post-traumatic stress  disorder (PTSD) 05/27/2018  . Chronic respiratory failure with hypoxia and hypercapnia (HCC) 06/22/2015   TRILOGY Vent >AVAPA-ES., Vt target 200-400, Max P 30 , PS max 20 , PS min 6-10 , E Max 6, E Min 4, Rate Auto AVAPS Rate 2 (titrate for pt comfort) , bleed O2 at 5l/m continuous flow .   [name] CKD (chronic kidney disease) stage 3, GFR 30-59 ml/min 12/15/2016  . Closed displaced fracture of fifth metacarpal bone 03/21/2018  . Cocaine use disorder, severe, in sustained remission ([place]) 12/17/2015  . Complication of anesthesia    decreased bp, decreased heart rate  . COPD (chronic obstructive pulmonary disease) ([place]) 07/08/2014  . Depression   . Diabetic neuropathy ([place]) 04/24/2011  . Difficulty with speech 01/24/2018  . Disorder of nervous system   . Drug abuse ([place]) 11/21/2017  . Dyslipidemia 04/24/2011  . Elevated troponin 04/28/2012  . Emphysema   . Encephalopathy 11/21/2017  . Essential hypertension 03/22/2016  . Fibula fracture 07/10/2016  . Frequent falls 10/11/2017  . GERD (gastroesophageal reflux disease)   . Gout 04/11/2017  . Heart attack ([place]) 1980s  . History of blood transfusion 1994   "couldn't stop bleeding from my period"  . History of drug abuse in remission ([place]) 11/28/2015   Quit in 2017  . Hyperlipidemia LDL goal <70   . Incontinence   . Manic depression ([place])   . Morbid obesity ([place]) 10/18/2010  . Obstructive sleep apnea 10/18/2010  .  On home oxygen therapy    "6L; 24/7" (04/10/2016)  . OSA on CPAP    "wear mask sometimes" (04/10/2016)  . Paranoid ([place])    "sometimes; I'm on RX for it" (04/10/2016)  . Prolonged Q-T interval on ECG   . Rectal bleeding 12/31/2015  . Right carotid bruit 11/09/2016  . Schizoaffective disorder, bipolar type ([place]) 04/05/2018  . Seasonal allergies   . Seborrheic keratoses 12/31/2013  . Seizures ([place])    "don't know what kind; last one was ~ 1 yr ago" (04/10/2016)  . Stroke [facility]) 1980s   denies residual on 04/10/2016  . Thrush 09/19/2013  . Type 2  diabetes mellitus ([place]) 10/18/2010   Allergies  Allergen Reactions  . Hydrocodone Shortness Of Breath  . Hydrocodone-Acetaminophen Shortness Of Breath  . Hydroxyzine Anaphylaxis and Shortness Of Breath  . Latuda [Lurasidone Hcl] Anaphylaxis  . Lurasidone Anaphylaxis  . Magnesium-Containing Compounds Anaphylaxis  . Prednisone Anaphylaxis, Swelling and Other (See Comments)    Tongue swelling  . Tramadol Anaphylaxis and Swelling  . Codeine Nausea And Vomiting  . Other Rash  . Sulfa Antibiotics Itching  . Tape Rash    Current Outpatient Medications on File Prior to Visit  Medication Sig Dispense Refill  . albuterol (PROVENTIL) (2.5 MG/3ML) 0.083% nebulizer solution Take 3 mLs (2.5 mg total) by nebulization every 6 (six) hours as needed for wheezing or shortness of breath. 75 mL 5  . albuterol (VENTOLIN HFA) 108 (90 Base) MCG/ACT inhaler Inhale 2 puffs into the lungs every 6 (six) hours as needed for wheezing or shortness of breath. 18 g 2  . Asenapine Maleate 10 MG SUBL Place 1 tablet (10 mg total) under the tongue in the morning, at noon, and at bedtime. 90 tablet 2  . aspirin (GOODSENSE ASPIRIN LOW DOSE) 81 MG EC tablet TAKE 1 TABLET (81 MG TOTAL) BY MOUTH DAILY (MORNING). 90 tablet 3  . atorvastatin (LIPITOR) 80 MG tablet Take 1 tablet (80 mg total) by mouth daily. 90 tablet 0  . colchicine 0.6 MG tablet TAKE 1 TABLET 2 (TWO) TIMES DAILY. TAKE 1 TABLET (0.6 MG TOTAL) BY MOUTH 2 (TWO) TIMES DAILY. 60 tablet 0  . diclofenac (VOLTAREN) 75 MG EC tablet Take 1 tablet (75 mg total) by mouth 2 (two) times daily. 60 tablet 1  . escitalopram (LEXAPRO) 20 MG tablet Take 2 tablets (40 mg total) by mouth daily. 180 tablet 0  . febuxostat (ULORIC) 40 MG tablet Take 1 tablet (40 mg total) by mouth daily. 30 tablet 0  . ferrous sulfate 325 (65 FE) MG tablet Take 1 tablet (325 mg total) by mouth daily with breakfast. (Patient taking differently: Take 325 mg by mouth 2 (two) times daily with a meal. ) 90  tablet 1  . Fluticasone-Umeclidin-Vilant (TRELEGY ELLIPTA) 100-62.5-25 MCG/INH AEPB Inhale 1 puff into the lungs daily. 60 each 3  . gabapentin (NEURONTIN) 600 MG tablet Take 1 tablet (600 mg total) by mouth 2 (two) times daily. 180 tablet 1  . metolazone (ZAROXOLYN) 2.5 MG tablet Take 1 tablet (2.5 mg total) by mouth daily. (Patient taking differently: Take 2.5 mg by mouth in the morning, at noon, and at bedtime. ) 90 tablet 1  . montelukast (SINGULAIR) 10 MG tablet Take 10 mg by mouth at bedtime.    . Multiple Vitamin (MULTIVITAMIN WITH MINERALS) TABS tablet Take 1 tablet by mouth daily.    [name] omeprazole (PRILOSEC) 40 MG capsule Take 1 capsule (40 mg total) by mouth 2 (two)  times daily. (Patient taking differently: Take 80 mg by mouth 2 (two) times daily. ) 180 capsule 3  . oxybutynin (DITROPAN-XL) 10 MG 24 hr tablet Take 1-2 tablets (10-20 mg total) by mouth at bedtime. (Patient taking differently: Take 20 mg by mouth 2 (two) times daily. ) 60 tablet 1  . traZODone (DESYREL) 100 MG tablet Take 1 tablet (100 mg total) by mouth at bedtime. 90 tablet 0  . amLODipine (NORVASC) 10 MG tablet TAKE 1 TABLET (5 MG TOTAL) BY MOUTH EVERY MORNING 90 tablet 0  . budesonide-formoterol (SYMBICORT) 80-4.5 MCG/ACT inhaler Inhale 2 puffs into the lungs 2 (two) times daily. 1 Inhaler 2  . diazepam (VALIUM) 10 MG tablet Take 1 tablet (10 mg total) by mouth 3 (three) times daily. 90 tablet 2  . lisdexamfetamine (VYVANSE) 70 MG capsule Take 1 capsule (70 mg total) by mouth daily before breakfast. 30 capsule 0  . potassium chloride SA (KLOR-CON) 20 MEQ tablet Take 2 tablets (40 mEq total) by mouth 2 (two) times daily. 360 tablet 1  . prazosin (MINIPRESS) 5 MG capsule Take 1 capsule (5 mg total) by mouth 2 (two) times daily. 60 capsule 2   No current facility-administered medications on file prior to visit.    Observations/Objective: NAD. Speaking clearly.  Work of breathing normal.  Alert and oriented. Mood  appropriate.   Assessment and Plan: 1. Lumbar pain with radiation down both legs Reportedly has MRI findings of nerve impingement and I suspect disc herniation or DDD as etiology of this impingement. However, am unable to review this image report. Will refer to orthopedics for management.  - Ambulatory referral to Orthopedic Surgery - DME Wheelchair electric - Home Health - Face-to-face encounter (required for Medicare/Medicaid patients)  2. Debility Patient requests renewal of [place] aid and RN for debility and chronic medical conditions including but not limited to OSA, CHF, h/o anoxic brain injury related to respiratory arrest, COPD, OA, gout all of which impact her ability to safely leave the home without assistance. Given above lumbar pain and instability with walking and recurrent falls, would benefit from use of wheelchair.  - DME Wheelchair electric - Home Health - Face-to-face encounter (required for Medicare/Medicaid patients)  3. Housing situation unstable Will send message to Opal Sidles, RN and Christa See, LCSW with [facility] to reach out to patients with resources regarding housing.    Follow Up Instructions: PRN and for routine medical care    I discussed the assessment and treatment plan with the patient. The patient was provided an opportunity to ask questions and all were answered. The patient agreed with the plan and demonstrated an understanding of the instructions.   The patient was advised to call back or seek an in-person evaluation if the symptoms worsen or if the condition fails to improve as anticipated.     I provided 35 minutes total of non-face-to-face time during this encounter including median intraservice time, reviewing previous notes, investigations, ordering medications, medical decision making, coordinating care and patient verbalized understanding at the end of the visit.    Phill Myron, D.O. Primary [facility] at [facility]  09/08/2019, 11:10 AM

## 2019-09-08 NOTE — Telephone Encounter (Signed)
Error

## 2019-09-08 NOTE — Telephone Encounter (Signed)
Patient called in and requested for her medications to be refilled as discussed in her virtual visit. Please follow up at your earliest convenience.

## 2019-09-08 NOTE — Telephone Encounter (Signed)
Refills sent per provider.

## 2019-09-10 ENCOUNTER — Other Ambulatory Visit: Payer: Self-pay

## 2019-09-10 ENCOUNTER — Telehealth: Payer: Self-pay

## 2019-09-10 DIAGNOSIS — M1990 Unspecified osteoarthritis, unspecified site: Secondary | ICD-10-CM

## 2019-09-10 DIAGNOSIS — N3281 Overactive bladder: Secondary | ICD-10-CM

## 2019-09-10 MED ORDER — DICLOFENAC SODIUM 1 % EX GEL: 2.0000 g | Freq: Four times a day (QID) | CUTANEOUS | 0 refills | Status: DC

## 2019-09-10 MED ORDER — FEBUXOSTAT 40 MG PO TABS: 40.0000 mg | ORAL_TABLET | Freq: Every day | ORAL | 0 refills | Status: DC

## 2019-09-10 MED ORDER — DICLOFENAC SODIUM 75 MG PO TBEC: 75.0000 mg | DELAYED_RELEASE_TABLET | Freq: Two times a day (BID) | ORAL | 1 refills | Status: DC

## 2019-09-10 NOTE — Telephone Encounter (Signed)
Uloric sent to pharmacy. Diclofenac sent as a refill encounter to provider to fill.  Valium is filled by her Psych provider. She needs to call their office for refills.

## 2019-09-10 NOTE — Telephone Encounter (Signed)
Patient called in to inform pcp/nurse that she went to the pharmacy and they told her they have not received it. Patient was informed of note at the bottom of the scripts that states both medications receipts were confirmed by pharmacy and the listed times. Patient requested for another note to be sent because she is in the need of her medications. Please follow up at your earliest convenience. Patient requested for medications to go to [facility] on [address].  Patient was also informed about referral info. Patient verbalized understanding and had no further questions or concerns at this time.

## 2019-09-10 NOTE — Telephone Encounter (Signed)
Referral was ordered. They have already called her & left a voicemail to attempt to schedule her.   General 09/09/2019 12:23 PM Rondall Allegra - -  Note   Left message for patient to return call and schedule an appointment       [facility] Referral Coordinator [place] [phone]

## 2019-09-10 NOTE — Telephone Encounter (Signed)
Patient called in and requested for listed medications to be refilled and sent to [facility] [address]   diclofenac (VOLTAREN) 75 MG EC tablet [[id number]]  diazepam (VALIUM) 10 MG tablet [[id number]]  febuxostat (ULORIC) 40 MG tablet [[id number]]

## 2019-09-10 NOTE — Telephone Encounter (Signed)
Patient came into the office today and requested for listed medication to be refilled. Patient requested for Diclofenac to also be sent in with the gel. The patient also wanted an update on her the spinal procedure that was talked about. Patient requested for a call. Please f/u at your earliest convenience.

## 2019-09-11 ENCOUNTER — Other Ambulatory Visit: Payer: Self-pay

## 2019-09-11 DIAGNOSIS — M1990 Unspecified osteoarthritis, unspecified site: Secondary | ICD-10-CM

## 2019-09-11 DIAGNOSIS — N3281 Overactive bladder: Secondary | ICD-10-CM

## 2019-09-11 MED ORDER — DICLOFENAC SODIUM 75 MG PO TBEC: 75.0000 mg | DELAYED_RELEASE_TABLET | Freq: Two times a day (BID) | ORAL | 1 refills | Status: DC

## 2019-09-11 MED ORDER — FEBUXOSTAT 40 MG PO TABS: 40.0000 mg | ORAL_TABLET | Freq: Every day | ORAL | 0 refills | Status: DC

## 2019-09-11 NOTE — Telephone Encounter (Signed)
Patient was informed of this information, patient was given [facility]'s information and patient read it back to me correctly. No further questions or concerns at this time.

## 2019-09-16 ENCOUNTER — Telehealth (HOSPITAL_COMMUNITY): Payer: Medicare Other | Admitting: Psychiatry

## 2019-09-16 ENCOUNTER — Ambulatory Visit: Payer: Medicaid Other | Admitting: Family Medicine

## 2019-09-19 IMAGING — DX DG CHEST 1V PORT
1 series · 1 of 1 positions shown · non-contrast
Comparison: 09/14/2016

CLINICAL DATA: Patient experiencing short of breath and LEFT chest
pain for 24 hours.

EXAM:
PORTABLE CHEST 1 VIEW

[chest]
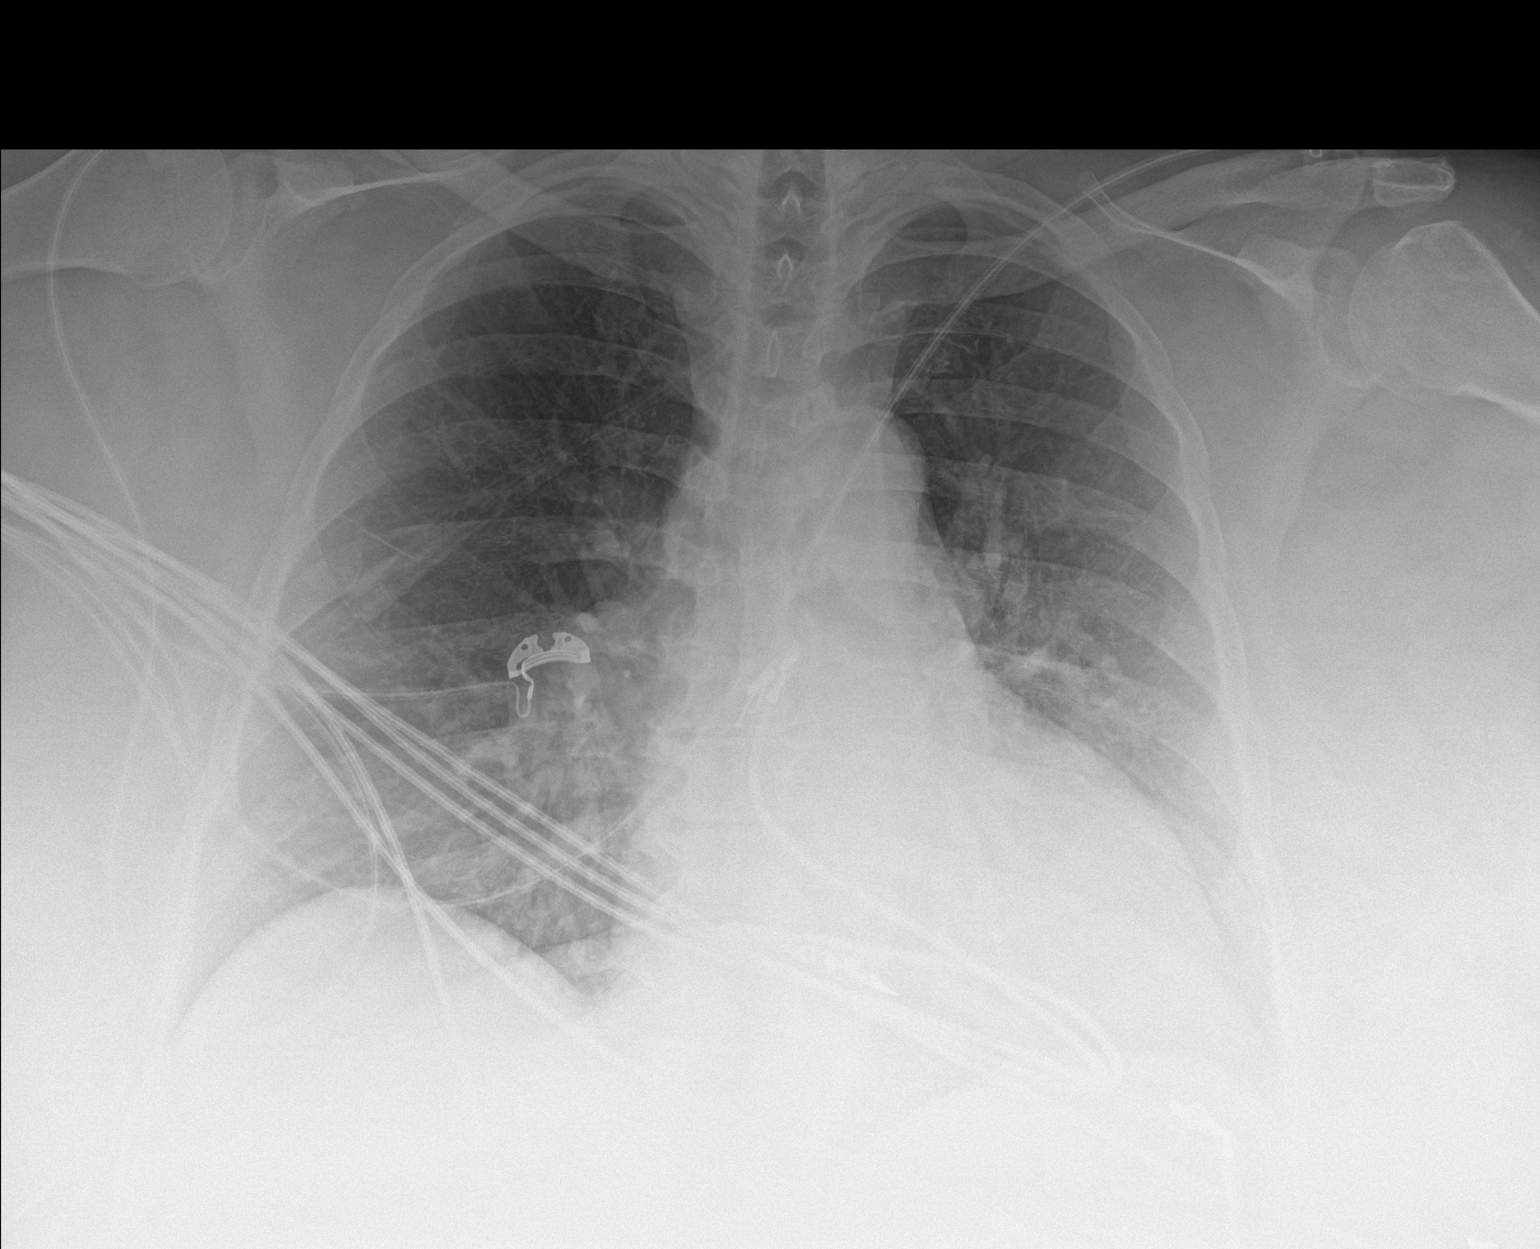

[1 of 1 positions shown; findings below may reference images not displayed]

FINDINGS: Normal cardiac silhouette. No effusion, infiltrate or pneumothorax.
Mild central venous congestion. No acute osseous abnormality.
IMPRESSION: Mild central venous congestion.  No infiltrate.

## 2019-09-20 DIAGNOSIS — J449 Chronic obstructive pulmonary disease, unspecified: Secondary | ICD-10-CM | POA: Diagnosis not present

## 2019-09-24 DIAGNOSIS — J449 Chronic obstructive pulmonary disease, unspecified: Secondary | ICD-10-CM | POA: Diagnosis not present

## 2019-09-25 DIAGNOSIS — J45909 Unspecified asthma, uncomplicated: Secondary | ICD-10-CM | POA: Diagnosis not present

## 2019-09-25 DIAGNOSIS — G4733 Obstructive sleep apnea (adult) (pediatric): Secondary | ICD-10-CM | POA: Diagnosis not present

## 2019-09-25 DIAGNOSIS — R062 Wheezing: Secondary | ICD-10-CM | POA: Diagnosis not present

## 2019-09-28 ENCOUNTER — Emergency Department (HOSPITAL_COMMUNITY)
Admission: EM | Admit: 2019-09-28 | Discharge: 2019-09-29 | Disposition: A | Discharge status: Home / Self Care | Payer: Medicare Other | Attending: Emergency Medicine | Admitting: Emergency Medicine

## 2019-09-28 ENCOUNTER — Other Ambulatory Visit: Payer: Self-pay

## 2019-09-28 ENCOUNTER — Encounter (HOSPITAL_COMMUNITY): Payer: Self-pay | Admitting: Emergency Medicine

## 2019-09-28 DIAGNOSIS — Z79899 Other long term (current) drug therapy: Secondary | ICD-10-CM | POA: Insufficient documentation

## 2019-09-28 DIAGNOSIS — N390 Urinary tract infection, site not specified: Secondary | ICD-10-CM | POA: Diagnosis not present

## 2019-09-28 DIAGNOSIS — I5032 Chronic diastolic (congestive) heart failure: Secondary | ICD-10-CM | POA: Diagnosis not present

## 2019-09-28 DIAGNOSIS — Z7982 Long term (current) use of aspirin: Secondary | ICD-10-CM | POA: Insufficient documentation

## 2019-09-28 DIAGNOSIS — N289 Disorder of kidney and ureter, unspecified: Secondary | ICD-10-CM

## 2019-09-28 DIAGNOSIS — M5442 Lumbago with sciatica, left side: Secondary | ICD-10-CM | POA: Insufficient documentation

## 2019-09-28 DIAGNOSIS — R7401 Elevation of levels of liver transaminase levels: Secondary | ICD-10-CM

## 2019-09-28 DIAGNOSIS — E1140 Type 2 diabetes mellitus with diabetic neuropathy, unspecified: Secondary | ICD-10-CM | POA: Diagnosis not present

## 2019-09-28 DIAGNOSIS — Z87891 Personal history of nicotine dependence: Secondary | ICD-10-CM | POA: Diagnosis not present

## 2019-09-28 DIAGNOSIS — J45909 Unspecified asthma, uncomplicated: Secondary | ICD-10-CM | POA: Diagnosis not present

## 2019-09-28 DIAGNOSIS — R748 Abnormal levels of other serum enzymes: Secondary | ICD-10-CM

## 2019-09-28 DIAGNOSIS — R319 Hematuria, unspecified: Secondary | ICD-10-CM | POA: Diagnosis not present

## 2019-09-28 DIAGNOSIS — M545 Low back pain: Secondary | ICD-10-CM | POA: Diagnosis not present

## 2019-09-28 DIAGNOSIS — I130 Hypertensive heart and chronic kidney disease with heart failure and stage 1 through stage 4 chronic kidney disease, or unspecified chronic kidney disease: Secondary | ICD-10-CM | POA: Diagnosis not present

## 2019-09-28 DIAGNOSIS — Z7951 Long term (current) use of inhaled steroids: Secondary | ICD-10-CM | POA: Diagnosis not present

## 2019-09-28 DIAGNOSIS — G8929 Other chronic pain: Secondary | ICD-10-CM

## 2019-09-28 DIAGNOSIS — N3091 Cystitis, unspecified with hematuria: Secondary | ICD-10-CM | POA: Diagnosis not present

## 2019-09-28 DIAGNOSIS — D649 Anemia, unspecified: Secondary | ICD-10-CM | POA: Diagnosis not present

## 2019-09-28 DIAGNOSIS — N183 Chronic kidney disease, stage 3 unspecified: Secondary | ICD-10-CM | POA: Diagnosis not present

## 2019-09-28 DIAGNOSIS — M5441 Lumbago with sciatica, right side: Secondary | ICD-10-CM | POA: Diagnosis not present

## 2019-09-28 DIAGNOSIS — E162 Hypoglycemia, unspecified: Secondary | ICD-10-CM | POA: Diagnosis not present

## 2019-09-28 DIAGNOSIS — E161 Other hypoglycemia: Secondary | ICD-10-CM | POA: Diagnosis not present

## 2019-09-28 LAB — CBG MONITORING, ED
Glucose-Capillary: 55 mg/dL — ABNORMAL LOW (ref 70–99)
Glucose-Capillary: 79 mg/dL (ref 70–99)

## 2019-09-28 LAB — COMPREHENSIVE METABOLIC PANEL
ALT: 40 U/L (ref 0–44)
AST: 56 U/L — ABNORMAL HIGH (ref 15–41)
Albumin: 3.9 g/dL (ref 3.5–5.0)
Alkaline Phosphatase: 165 U/L — ABNORMAL HIGH (ref 38–126)
Anion gap: 12 (ref 5–15)
BUN: 29 mg/dL — ABNORMAL HIGH (ref 6–20)
CO2: 17 mmol/L — ABNORMAL LOW (ref 22–32)
Calcium: 9.3 mg/dL (ref 8.9–10.3)
Chloride: 109 mmol/L (ref 98–111)
Creatinine, Ser: 2.23 mg/dL — ABNORMAL HIGH (ref 0.44–1.00)
GFR calc Af Amer: 27 mL/min — ABNORMAL LOW (ref >60)
GFR calc non Af Amer: 23 mL/min — ABNORMAL LOW (ref >60)
Glucose, Bld: 61 mg/dL — ABNORMAL LOW (ref 70–99)
Potassium: 4.9 mmol/L (ref 3.5–5.1)
Sodium: 138 mmol/L (ref 135–145)
Total Bilirubin: 0.5 mg/dL (ref 0.3–1.2)
Total Protein: 6.7 g/dL (ref 6.5–8.1)

## 2019-09-28 LAB — LIPASE, BLOOD: Lipase: 30 U/L (ref 11–51)

## 2019-09-28 LAB — CBC
HCT: 37.2 % (ref 36.0–46.0)
Hemoglobin: 10.9 g/dL — ABNORMAL LOW (ref 12.0–15.0)
MCH: 27.7 pg (ref 26.0–34.0)
MCHC: 29.3 g/dL — ABNORMAL LOW (ref 30.0–36.0)
MCV: 94.4 fL (ref 80.0–100.0)
Platelets: 333 10*3/uL (ref 150–400)
RBC: 3.94 MIL/uL (ref 3.87–5.11)
RDW: 15.7 % — ABNORMAL HIGH (ref 11.5–15.5)
WBC: 7.6 10*3/uL (ref 4.0–10.5)
nRBC: 0 % (ref 0.0–0.2)

## 2019-09-28 NOTE — ED Triage Notes (Signed)
Pt to triage via [facility] from [facility].  Reports diarrhea x 1 week.  Lower back pain and hematuria since last night.  Denies abd pain.

## 2019-09-28 NOTE — ED Triage Notes (Signed)
Triage note  Given apple juice with 3 pks of sugar  Refused graham crackers.  IV was attempted unsuccessful .  She refused further.  Was then given OJ with 5 sugars   Still refusing graham crackers.[name]

## 2019-09-28 NOTE — ED Notes (Signed)
Pt CBG 55. Notified Santiago Glad, Therapist, sports. Pt given 2 cups apple juice and 3 packs graham crackers.

## 2019-09-29 ENCOUNTER — Ambulatory Visit: Payer: Medicaid Other | Admitting: Family Medicine

## 2019-09-29 ENCOUNTER — Other Ambulatory Visit: Payer: Self-pay

## 2019-09-29 DIAGNOSIS — N3091 Cystitis, unspecified with hematuria: Secondary | ICD-10-CM | POA: Diagnosis not present

## 2019-09-29 DIAGNOSIS — R300 Dysuria: Secondary | ICD-10-CM | POA: Insufficient documentation

## 2019-09-29 DIAGNOSIS — M549 Dorsalgia, unspecified: Secondary | ICD-10-CM | POA: Insufficient documentation

## 2019-09-29 DIAGNOSIS — R319 Hematuria, unspecified: Secondary | ICD-10-CM | POA: Insufficient documentation

## 2019-09-29 DIAGNOSIS — Z5321 Procedure and treatment not carried out due to patient leaving prior to being seen by health care provider: Secondary | ICD-10-CM | POA: Insufficient documentation

## 2019-09-29 LAB — URINALYSIS, ROUTINE W REFLEX MICROSCOPIC
Bilirubin Urine: NEGATIVE
Glucose, UA: NEGATIVE mg/dL
Ketones, ur: NEGATIVE mg/dL
Nitrite: NEGATIVE
Protein, ur: 100 mg/dL — AB
RBC / HPF: >50 RBC/hpf — ABNORMAL HIGH (ref 0–5)
Specific Gravity, Urine: 1.004 — ABNORMAL LOW (ref 1.005–1.030)
WBC, UA: >50 WBC/hpf — ABNORMAL HIGH (ref 0–5)
pH: 6 (ref 5.0–8.0)

## 2019-09-29 LAB — CBG MONITORING, ED: Glucose-Capillary: 78 mg/dL (ref 70–99)

## 2019-09-29 MED ORDER — SODIUM CHLORIDE 0.9 % IV BOLUS
1000.0000 mL | Freq: Once | INTRAVENOUS | Status: AC
  Administered 2019-09-29: 1000 mL via INTRAVENOUS

## 2019-09-29 MED ORDER — CEPHALEXIN 500 MG PO CAPS: 500.0000 mg | ORAL_CAPSULE | Freq: Three times a day (TID) | ORAL | 0 refills | Status: DC

## 2019-09-29 MED ORDER — SODIUM CHLORIDE 0.9 % IV SOLN
1.0000 g | Freq: Once | INTRAVENOUS | Status: AC
  Administered 2019-09-29: 1 g via INTRAVENOUS
  Filled 2019-09-29: qty 10

## 2019-09-29 NOTE — Discharge Instructions (Addendum)
Stop taking the diclofenac tablets - they are harming your kidneys. However, you may continue to use the diclofenac gel.  Do not take ibuprofen or naproxen. They can also harm your kidneys.  You may take acetaminophen as needed for pain.  Follow up with the orthopedic doctor you were referred to.  Use ice or heat on your back as needed.  You need to follow up with your primary care provider next week. She will need to check your urine to make sure the infection is gone. She will also need to check your creatinine (blood test for your kidneys) to make sure it is improving.

## 2019-09-29 NOTE — ED Notes (Signed)
Patient verbalizes understanding of discharge instructions. Opportunity for questioning and answers were provided. Armband removed by staff, pt discharged from ED.  
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
## 2019-09-30 ENCOUNTER — Encounter (HOSPITAL_COMMUNITY): Payer: Self-pay | Admitting: Emergency Medicine

## 2019-09-30 ENCOUNTER — Other Ambulatory Visit: Payer: Self-pay

## 2019-09-30 ENCOUNTER — Encounter (HOSPITAL_COMMUNITY): Payer: Self-pay

## 2019-09-30 ENCOUNTER — Ambulatory Visit (HOSPITAL_COMMUNITY)
Admission: EM | Admit: 2019-09-30 | Discharge: 2019-09-30 | Disposition: A | Discharge status: Home / Self Care | Payer: Medicare Other | Attending: Family Medicine | Admitting: Family Medicine

## 2019-09-30 ENCOUNTER — Emergency Department (HOSPITAL_COMMUNITY)
Admission: EM | Admit: 2019-09-30 | Discharge: 2019-09-30 | Disposition: A | Discharge status: Home / Self Care | Payer: Medicare Other | Attending: Emergency Medicine | Admitting: Emergency Medicine

## 2019-09-30 ENCOUNTER — Emergency Department (HOSPITAL_COMMUNITY)
Admission: EM | Admit: 2019-09-30 | Discharge: 2019-09-30 | Disposition: A | Discharge status: Home / Self Care | Payer: Medicare Other | Source: Home / Self Care

## 2019-09-30 DIAGNOSIS — R300 Dysuria: Secondary | ICD-10-CM | POA: Diagnosis not present

## 2019-09-30 DIAGNOSIS — N183 Chronic kidney disease, stage 3 unspecified: Secondary | ICD-10-CM | POA: Insufficient documentation

## 2019-09-30 DIAGNOSIS — N3090 Cystitis, unspecified without hematuria: Secondary | ICD-10-CM

## 2019-09-30 DIAGNOSIS — I5032 Chronic diastolic (congestive) heart failure: Secondary | ICD-10-CM | POA: Insufficient documentation

## 2019-09-30 DIAGNOSIS — N390 Urinary tract infection, site not specified: Secondary | ICD-10-CM | POA: Diagnosis not present

## 2019-09-30 DIAGNOSIS — E1122 Type 2 diabetes mellitus with diabetic chronic kidney disease: Secondary | ICD-10-CM | POA: Diagnosis not present

## 2019-09-30 DIAGNOSIS — J449 Chronic obstructive pulmonary disease, unspecified: Secondary | ICD-10-CM | POA: Diagnosis not present

## 2019-09-30 DIAGNOSIS — R319 Hematuria, unspecified: Secondary | ICD-10-CM

## 2019-09-30 DIAGNOSIS — Z955 Presence of coronary angioplasty implant and graft: Secondary | ICD-10-CM | POA: Diagnosis not present

## 2019-09-30 DIAGNOSIS — I130 Hypertensive heart and chronic kidney disease with heart failure and stage 1 through stage 4 chronic kidney disease, or unspecified chronic kidney disease: Secondary | ICD-10-CM | POA: Diagnosis not present

## 2019-09-30 DIAGNOSIS — Z87891 Personal history of nicotine dependence: Secondary | ICD-10-CM | POA: Diagnosis not present

## 2019-09-30 DIAGNOSIS — E1140 Type 2 diabetes mellitus with diabetic neuropathy, unspecified: Secondary | ICD-10-CM | POA: Diagnosis not present

## 2019-09-30 DIAGNOSIS — M79604 Pain in right leg: Secondary | ICD-10-CM | POA: Diagnosis not present

## 2019-09-30 DIAGNOSIS — J45909 Unspecified asthma, uncomplicated: Secondary | ICD-10-CM | POA: Insufficient documentation

## 2019-09-30 LAB — POCT URINALYSIS DIPSTICK, ED / UC
Glucose, UA: NEGATIVE mg/dL
Ketones, ur: 15 mg/dL — AB
Nitrite: NEGATIVE
Protein, ur: >=300 mg/dL — AB
Specific Gravity, Urine: >=1.03 (ref 1.005–1.030)
Urobilinogen, UA: 0.2 mg/dL (ref 0.0–1.0)
pH: 6.5 (ref 5.0–8.0)

## 2019-09-30 IMAGING — CR DG CHEST 2V
2 series · 2 of 2 positions shown · non-contrast
Comparison: Chest x-ray 10/06/2016.

CLINICAL DATA: 56-year-old female with history of shortness of
breath this morning.

EXAM:
CHEST  2 VIEW

[chest lat]
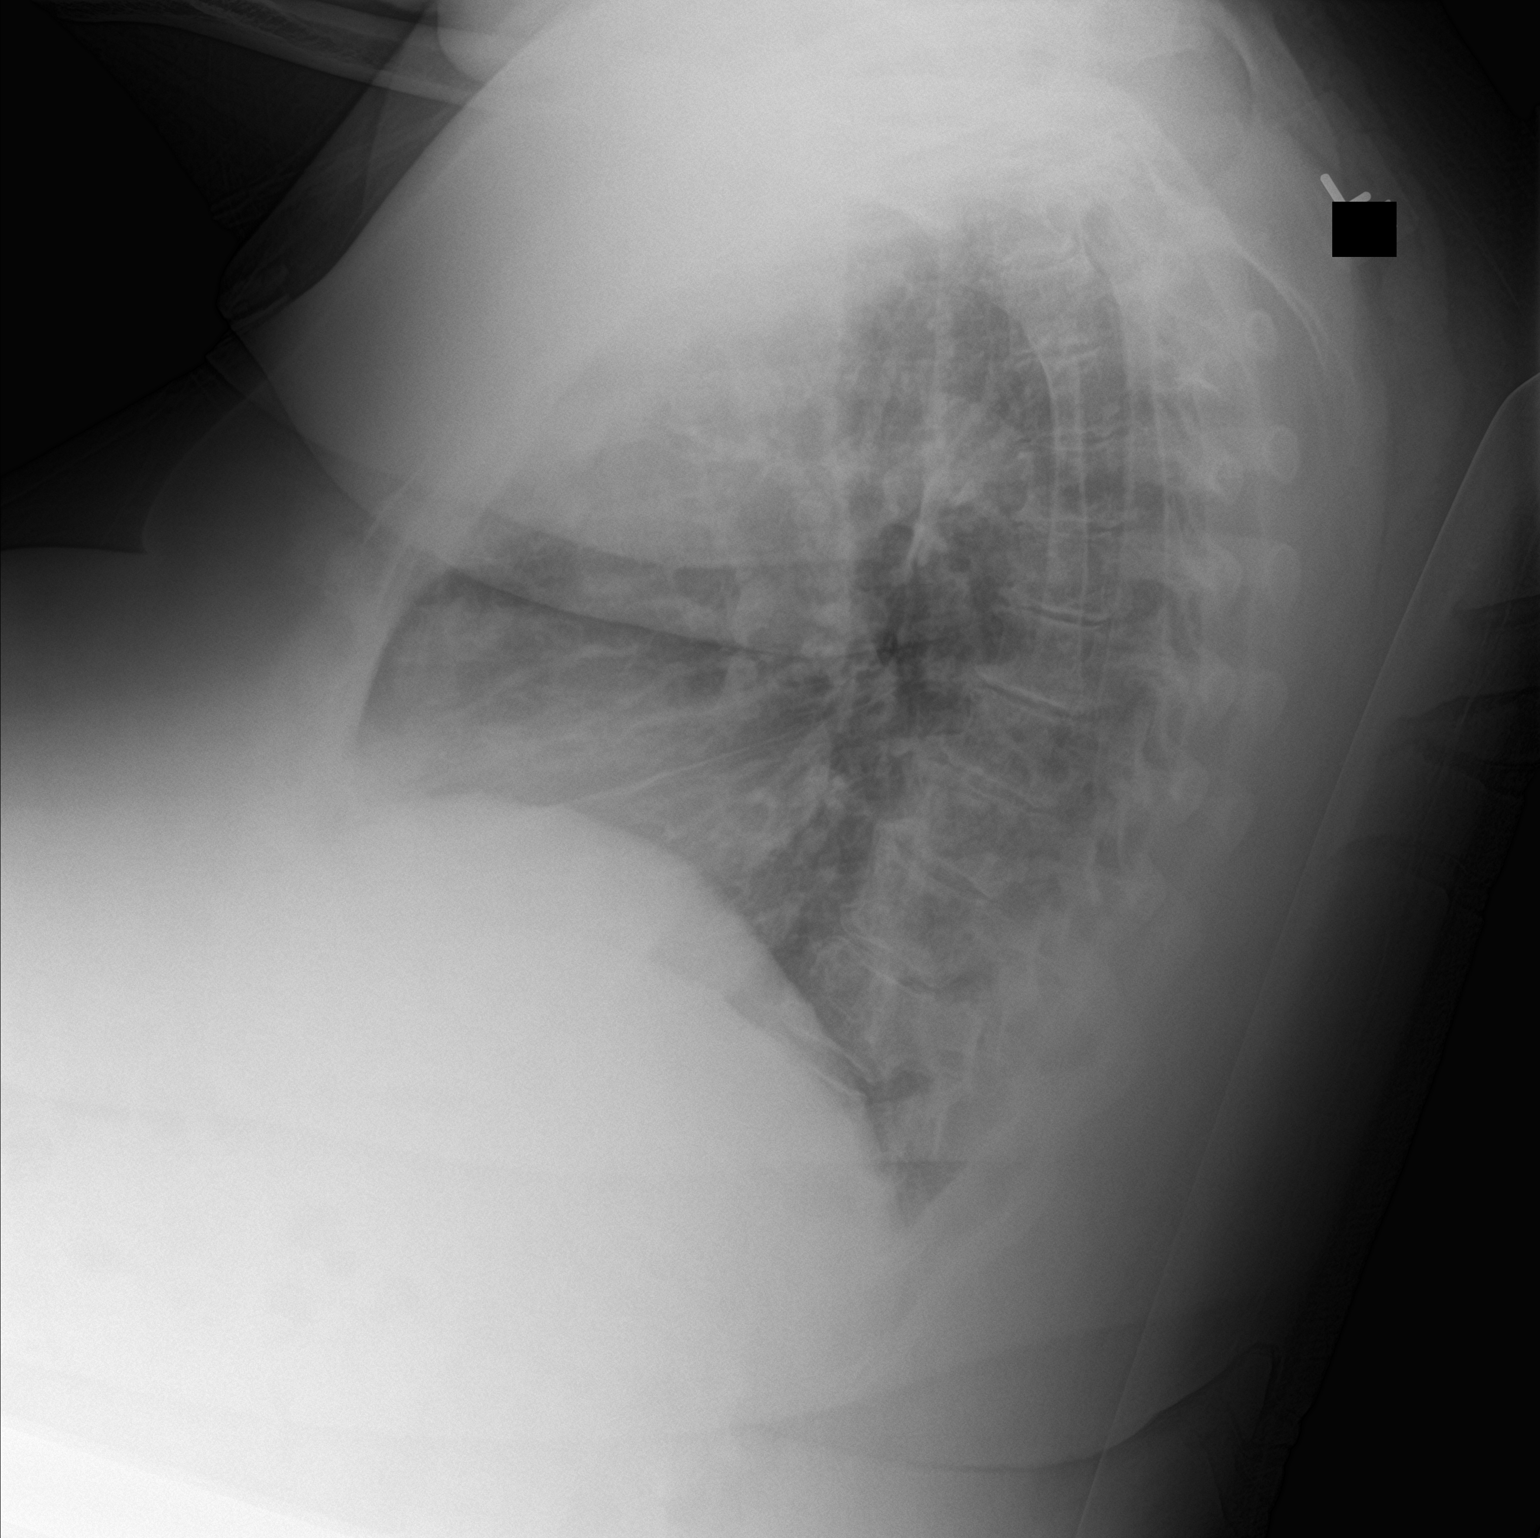

[chest ap]
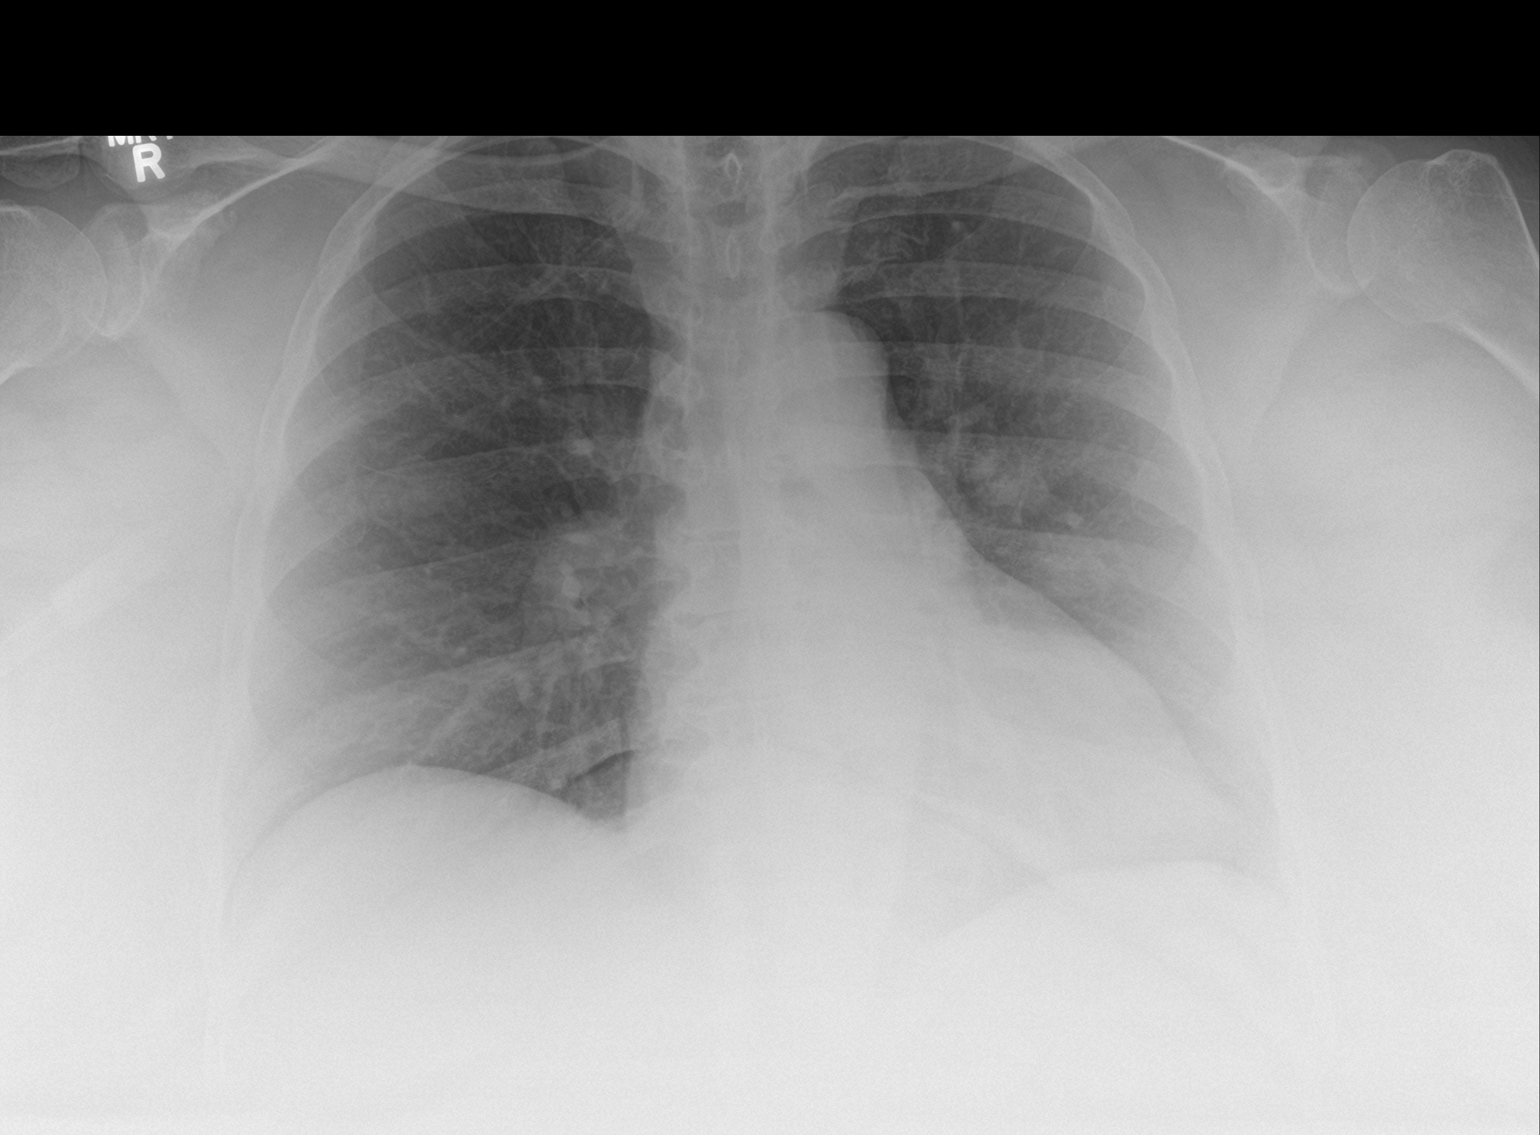

[2 of 2 positions shown; findings below may reference images not displayed]

FINDINGS: Subtle opacity in the left retrocardiac region concerning for left
lower lobe airspace consolidation. Right lung is clear. No pleural
effusions. No evidence of pulmonary edema. Heart size is normal.
Upper mediastinal contours are within normal limits.
IMPRESSION: 1. Probable early left lower lobe bronchopneumonia. Followup PA and
lateral chest X-ray is recommended in 3-4 weeks following trial of
antibiotic therapy to ensure resolution and exclude underlying
malignancy.

## 2019-09-30 MED ORDER — CEPHALEXIN 500 MG PO CAPS: 500.0000 mg | ORAL_CAPSULE | Freq: Two times a day (BID) | ORAL | 0 refills | Status: DC

## 2019-09-30 MED ORDER — ACETAMINOPHEN 500 MG PO TABS
500.0000 mg | ORAL_TABLET | Freq: Four times a day (QID) | ORAL | 0 refills | Status: DC | PRN
Start: 2019-09-30 — End: 2019-10-22

## 2019-09-30 MED ORDER — FOSFOMYCIN TROMETHAMINE 3 G PO PACK
3.0000 g | PACK | Freq: Once | ORAL | Status: AC
  Administered 2019-09-30: 3 g via ORAL
  Filled 2019-09-30: qty 3

## 2019-09-30 NOTE — ED Provider Notes (Signed)
[name] Emergency Department Provider Note MRN:  [id number]  [name] date & time: 09/30/19     Chief Complaint   Recurrent UTI   History of Present Illness   Tiffany [name] is a 59 y.o. year-old female with a history of schizoaffective disorder presenting to the ED with chief complaint of UTI.  Patient has a number of complaints today.  [name] here via EMS because her UTI medicine was not filled properly.  She has continued burning with urination.  [name] also endorsing chronic leg pain for several months, shortness of breath for several months, denies chest pain, no headache.  Explains that she has no place to stay tonight and refuses to go to the shelters.  Review of Systems  A complete 10 system review of systems was obtained and all systems are negative except as noted in the HPI and PMH.   Patient's Health History    Past Medical History:  Diagnosis Date  . Agitation 11/22/2017  . Anoxic brain injury ([place]) 09/08/2016   C. Arrest due to respiratory failure and COPD exacerbation  . Anxiety   . Arthritis    "all over" (04/10/2016)  . Asthma 10/18/2010  . Binge eating disorder   . Cardiac arrest ([place]) 09/08/2016   PEA  . Carotid artery stenosis    1-39% bilateral by dopplers 11/2016  . Chronic diastolic (congestive) heart failure ([place])   . Chronic kidney disease    "I see a kidney dr." (04/10/2016)  . Chronic pain syndrome 06/18/2012  . Chronic post-traumatic stress disorder (PTSD) 05/27/2018  . Chronic respiratory failure with hypoxia and hypercapnia (HCC) 06/22/2015   TRILOGY Vent >AVAPA-ES., Vt target 200-400, Max P 30 , PS max 20 , PS min 6-10 , E Max 6, E Min 4, Rate Auto AVAPS Rate 2 (titrate for pt comfort) , bleed O2 at 5l/m continuous flow .   [name] CKD (chronic kidney disease) stage 3, GFR 30-59 ml/min 12/15/2016  . Closed displaced fracture of fifth metacarpal bone 03/21/2018  . Cocaine use disorder, severe, in sustained remission ([place]) 12/17/2015   . Complication of anesthesia    decreased bp, decreased heart rate  . COPD (chronic obstructive pulmonary disease) ([place]) 07/08/2014  . Depression   . Diabetic neuropathy ([place]) 04/24/2011  . Difficulty with speech 01/24/2018  . Disorder of nervous system   . Drug abuse ([place]) 11/21/2017  . Dyslipidemia 04/24/2011  . Elevated troponin 04/28/2012  . Emphysema   . Encephalopathy 11/21/2017  . Essential hypertension 03/22/2016  . Fibula fracture 07/10/2016  . Frequent falls 10/11/2017  . GERD (gastroesophageal reflux disease)   . Gout 04/11/2017  . Heart attack ([place]) 1980s  . History of blood transfusion 1994   "couldn't stop bleeding from my period"  . History of drug abuse in remission ([place]) 11/28/2015   Quit in 2017  . Hyperlipidemia LDL goal <70   . Incontinence   . Manic depression ([place])   . Morbid obesity ([place]) 10/18/2010  . Obstructive sleep apnea 10/18/2010  . On home oxygen therapy    "6L; 24/7" (04/10/2016)  . OSA on CPAP    "wear mask sometimes" (04/10/2016)  . Paranoid ([place])    "sometimes; I'm on RX for it" (04/10/2016)  . Prolonged Q-T interval on ECG   . Rectal bleeding 12/31/2015  . Right carotid bruit 11/09/2016  . Schizoaffective disorder, bipolar type ([place]) 04/05/2018  . Seasonal allergies   . Seborrheic keratoses 12/31/2013  . Seizures ([place])    "  don't know what kind; last one was ~ 1 yr ago" (04/10/2016)  . Stroke [facility]) 1980s   denies residual on 04/10/2016  . Thrush 09/19/2013  . Type 2 diabetes mellitus ([place]) 10/18/2010    Past Surgical History:  Procedure Laterality Date  . CESAREAN SECTION  1997  . HERNIA REPAIR    . IR CHOLANGIOGRAM EXISTING TUBE  07/20/2016  . IR PERC CHOLECYSTOSTOMY  05/10/2016  . IR RADIOLOGIST EVAL & MGMT  06/08/2016  . IR RADIOLOGIST EVAL & MGMT  06/29/2016  . IR SINUS/FIST TUBE CHK-NON GI  07/12/2016  . RIGHT/LEFT HEART CATH AND CORONARY ANGIOGRAPHY N/A 06/19/2017   Procedure: RIGHT/LEFT HEART CATH AND CORONARY ANGIOGRAPHY;  Surgeon: Jolaine Artist, MD;  Location: [facility];  Service: Cardiovascular;  Laterality: N/A;  . TIBIA IM NAIL INSERTION Right 07/12/2016   Procedure: INTRAMEDULLARY (IM) NAIL RIGHT TIBIA;  Surgeon: Leandrew Koyanagi, MD;  Location: [facility];  Service: Orthopedics;  Laterality: Right;  . UMBILICAL HERNIA REPAIR  ~ 1963   "that's why I don't have a belly button"  . VAGINAL HYSTERECTOMY      Family History  Problem Relation Age of Onset  . Cancer Father        prostate  . Cancer Mother        lung  . Depression Mother   . Depression Sister   . Anxiety disorder Sister   . Schizophrenia Sister   . Bipolar disorder Sister   . Depression Sister   . Depression Brother   . Heart failure Other        cousin    Social History   Socioeconomic History  . Marital status: Widowed    Spouse name: Not on file  . Number of children: 3  . Years of education: Not on file  . Highest education level: Not on file  Occupational History  . Occupation: disabled    Comment: factory production  Tobacco Use  . Smoking status: Former Smoker    Packs/day: 1.50    Years: 38.00    Pack years: 57.00    Types: Cigarettes    Start date: 03/13/1977    Quit date: 04/10/2016    Years since quitting: 3.4  . Smokeless tobacco: Never Used  Vaping Use  . Vaping Use: Never used  Substance and Sexual Activity  . Alcohol use: No    Alcohol/week: 0.0 standard drinks  . Drug use: No    Types: Cocaine    Comment: 04/10/2016 "last used cocaine back in November 2017"  . Sexual activity: Not Currently    Birth control/protection: Surgical  Other Topics Concern  . Not on file  Social History Narrative   Has 1 son, Tiffany [name]   Lives with son and his boyfriend   Her house has ramps and handrails should she ever needs them.    Her mother lives down the street from her and is a good support person in addition to her son.   She drives herself, has private transportation.    Cocaine free since 02/24/16, smoke free since 04/10/16   Social  Determinants of Health   Financial Resource Strain:   . Difficulty of Paying Living Expenses: Not on file  Food Insecurity:   . Worried About Charity fundraiser in the Last Year: Not on file  . Ran Out of Food in the Last Year: Not on file  Transportation Needs:   . Lack of Transportation (Medical): Not on file  .  Lack of Transportation (Non-Medical): Not on file  Physical Activity:   . Days of Exercise per Week: Not on file  . Minutes of Exercise per Session: Not on file  Stress:   . Feeling of Stress : Not on file  Social Connections:   . Frequency of Communication with Friends and Family: Not on file  . Frequency of Social Gatherings with Friends and Family: Not on file  . Attends Religious Services: Not on file  . Active Member of Clubs or Organizations: Not on file  . Attends Archivist Meetings: Not on file  . Marital Status: Not on file  Intimate Partner Violence:   . Fear of Current or Ex-Partner: Not on file  . Emotionally Abused: Not on file  . Physically Abused: Not on file  . Sexually Abused: Not on file     Physical Exam   Vitals:   09/30/19 2056  BP: (!) 123/94  Pulse: 66  Resp: 16  Temp: 97.9 F (36.6 C)  SpO2: 99%    CONSTITUTIONAL: Chronically ill-appearing, NAD NEURO:  Alert and oriented x 3, no focal deficits EYES:  eyes equal and reactive ENT/NECK:  no LAD, no JVD CARDIO: Regular rate, well-perfused, normal S1 and S2 PULM:  CTAB no wheezing or rhonchi GI/GU:  normal bowel sounds, non-distended, non-tender MSK/SPINE:  No gross deformities, no edema SKIN:  no rash, atraumatic PSYCH:  Appropriate speech and behavior  *Additional and/or pertinent findings included in MDM below  Diagnostic and Interventional Summary    EKG Interpretation  Date/Time:    Ventricular Rate:    PR Interval:    QRS Duration:   QT Interval:    QTC Calculation:   R Axis:     Text Interpretation:        Labs Reviewed - No data to display  No orders  to display    Medications  fosfomycin (MONUROL) packet 3 g (has no administration in time range)     Procedures  /  Critical Care Procedures  ED Course and Medical Decision Making  I have reviewed the triage vital signs, the nursing notes, and pertinent available records from the EMR.  Listed above are laboratory and imaging tests that I personally ordered, reviewed, and interpreted and then considered in my medical decision making (see below for details).  Patient is with normal vital signs and in no acute distress, lungs clear, legs without swelling or signs of infection.  Abdomen soft and nontender.  Medical screening complete and no evidence of emergent medical condition.  Happy to treat her UTI, given her living situation and difficulty with the prescription, giving single dose fosfomycin.  Otherwise I feel patient is here mostly to have a place to stay.  I explained to her that this is not proper use of the emergency department.  Appropriate for discharge her for fosfomycin.       Barth Kirks. Sedonia Small, MD [place] [email or web address]  Final Clinical Impressions(s) / ED Diagnoses     ICD-10-CM   1. Dysuria  R30.0     ED Discharge Orders    None       Discharge Instructions Discussed with and Provided to Patient:     Discharge Instructions     You were evaluated in the Emergency Department and after careful evaluation, we did not find any emergent condition requiring admission or further testing in the hospital.  Your exam/testing today was overall reassuring.  We treated your  UTI with a one-time dose of antibiotics.  You do not need a prescription.  Please return to the Emergency Department if you experience any worsening of your condition.  Thank you for allowing [name] to be a part of your care.       Maudie Flakes, MD 09/30/19 2249

## 2019-09-30 NOTE — ED Triage Notes (Signed)
Pt c/o continued back pain with dysuria and hematuria. Pt states she has taken medication prescribed last night. Pt denies falling or any new injury

## 2019-09-30 NOTE — Discharge Instructions (Signed)
You were evaluated in the Emergency Department and after careful evaluation, we did not find any emergent condition requiring admission or further testing in the hospital.  Your exam/testing today was overall reassuring.  We treated your UTI with a one-time dose of antibiotics.  You do not need a prescription.  Please return to the Emergency Department if you experience any worsening of your condition.  Thank you for allowing [name] to be a part of your care.

## 2019-09-30 NOTE — ED Notes (Signed)
Patient is in wheelchair.  States she was in ED and was tired of waiting.  Patient speaks in complete sentences, skin is warm and dry.  Patient p.o. is 96 and heart rate 67.

## 2019-09-30 NOTE — ED Notes (Signed)
An After Visit Summary was printed and given to the patient. Discharge instructions given and no further questions at this time.  

## 2019-09-30 NOTE — ED Notes (Signed)
Sort called, no response

## 2019-09-30 NOTE — ED Notes (Signed)
Security bedside, due to pt refusing to leave.

## 2019-09-30 NOTE — ED Triage Notes (Signed)
Pt presents with right leg pain x 1 week; blood in urine x 3 days.

## 2019-09-30 NOTE — ED Triage Notes (Signed)
Patient is complaining of uti. Patient was seen at urgent care and gave medication. Patient went to [facility] because her medication is not ready. [facility] then [facility] and came [facility].

## 2019-10-01 ENCOUNTER — Other Ambulatory Visit: Payer: Self-pay

## 2019-10-01 ENCOUNTER — Encounter (HOSPITAL_COMMUNITY): Payer: Self-pay

## 2019-10-01 DIAGNOSIS — N183 Chronic kidney disease, stage 3 unspecified: Secondary | ICD-10-CM | POA: Insufficient documentation

## 2019-10-01 DIAGNOSIS — J449 Chronic obstructive pulmonary disease, unspecified: Secondary | ICD-10-CM | POA: Diagnosis not present

## 2019-10-01 DIAGNOSIS — E1140 Type 2 diabetes mellitus with diabetic neuropathy, unspecified: Secondary | ICD-10-CM | POA: Diagnosis not present

## 2019-10-01 DIAGNOSIS — E1122 Type 2 diabetes mellitus with diabetic chronic kidney disease: Secondary | ICD-10-CM | POA: Diagnosis not present

## 2019-10-01 DIAGNOSIS — M545 Low back pain: Secondary | ICD-10-CM | POA: Insufficient documentation

## 2019-10-01 DIAGNOSIS — Z79899 Other long term (current) drug therapy: Secondary | ICD-10-CM | POA: Insufficient documentation

## 2019-10-01 DIAGNOSIS — I5032 Chronic diastolic (congestive) heart failure: Secondary | ICD-10-CM | POA: Insufficient documentation

## 2019-10-01 DIAGNOSIS — Z9981 Dependence on supplemental oxygen: Secondary | ICD-10-CM | POA: Diagnosis not present

## 2019-10-01 DIAGNOSIS — Z87891 Personal history of nicotine dependence: Secondary | ICD-10-CM | POA: Insufficient documentation

## 2019-10-01 DIAGNOSIS — I252 Old myocardial infarction: Secondary | ICD-10-CM | POA: Insufficient documentation

## 2019-10-01 DIAGNOSIS — G8929 Other chronic pain: Secondary | ICD-10-CM | POA: Diagnosis not present

## 2019-10-01 DIAGNOSIS — I130 Hypertensive heart and chronic kidney disease with heart failure and stage 1 through stage 4 chronic kidney disease, or unspecified chronic kidney disease: Secondary | ICD-10-CM | POA: Diagnosis not present

## 2019-10-01 LAB — URINE CULTURE: Culture: >=100000 — AB

## 2019-10-01 IMAGING — DX DG CHEST 2V
2 series · 2 of 2 positions shown · non-contrast
Comparison: 10/17/2016

CLINICAL DATA: Dyspnea

EXAM:
CHEST  2 VIEW

[chest lat]
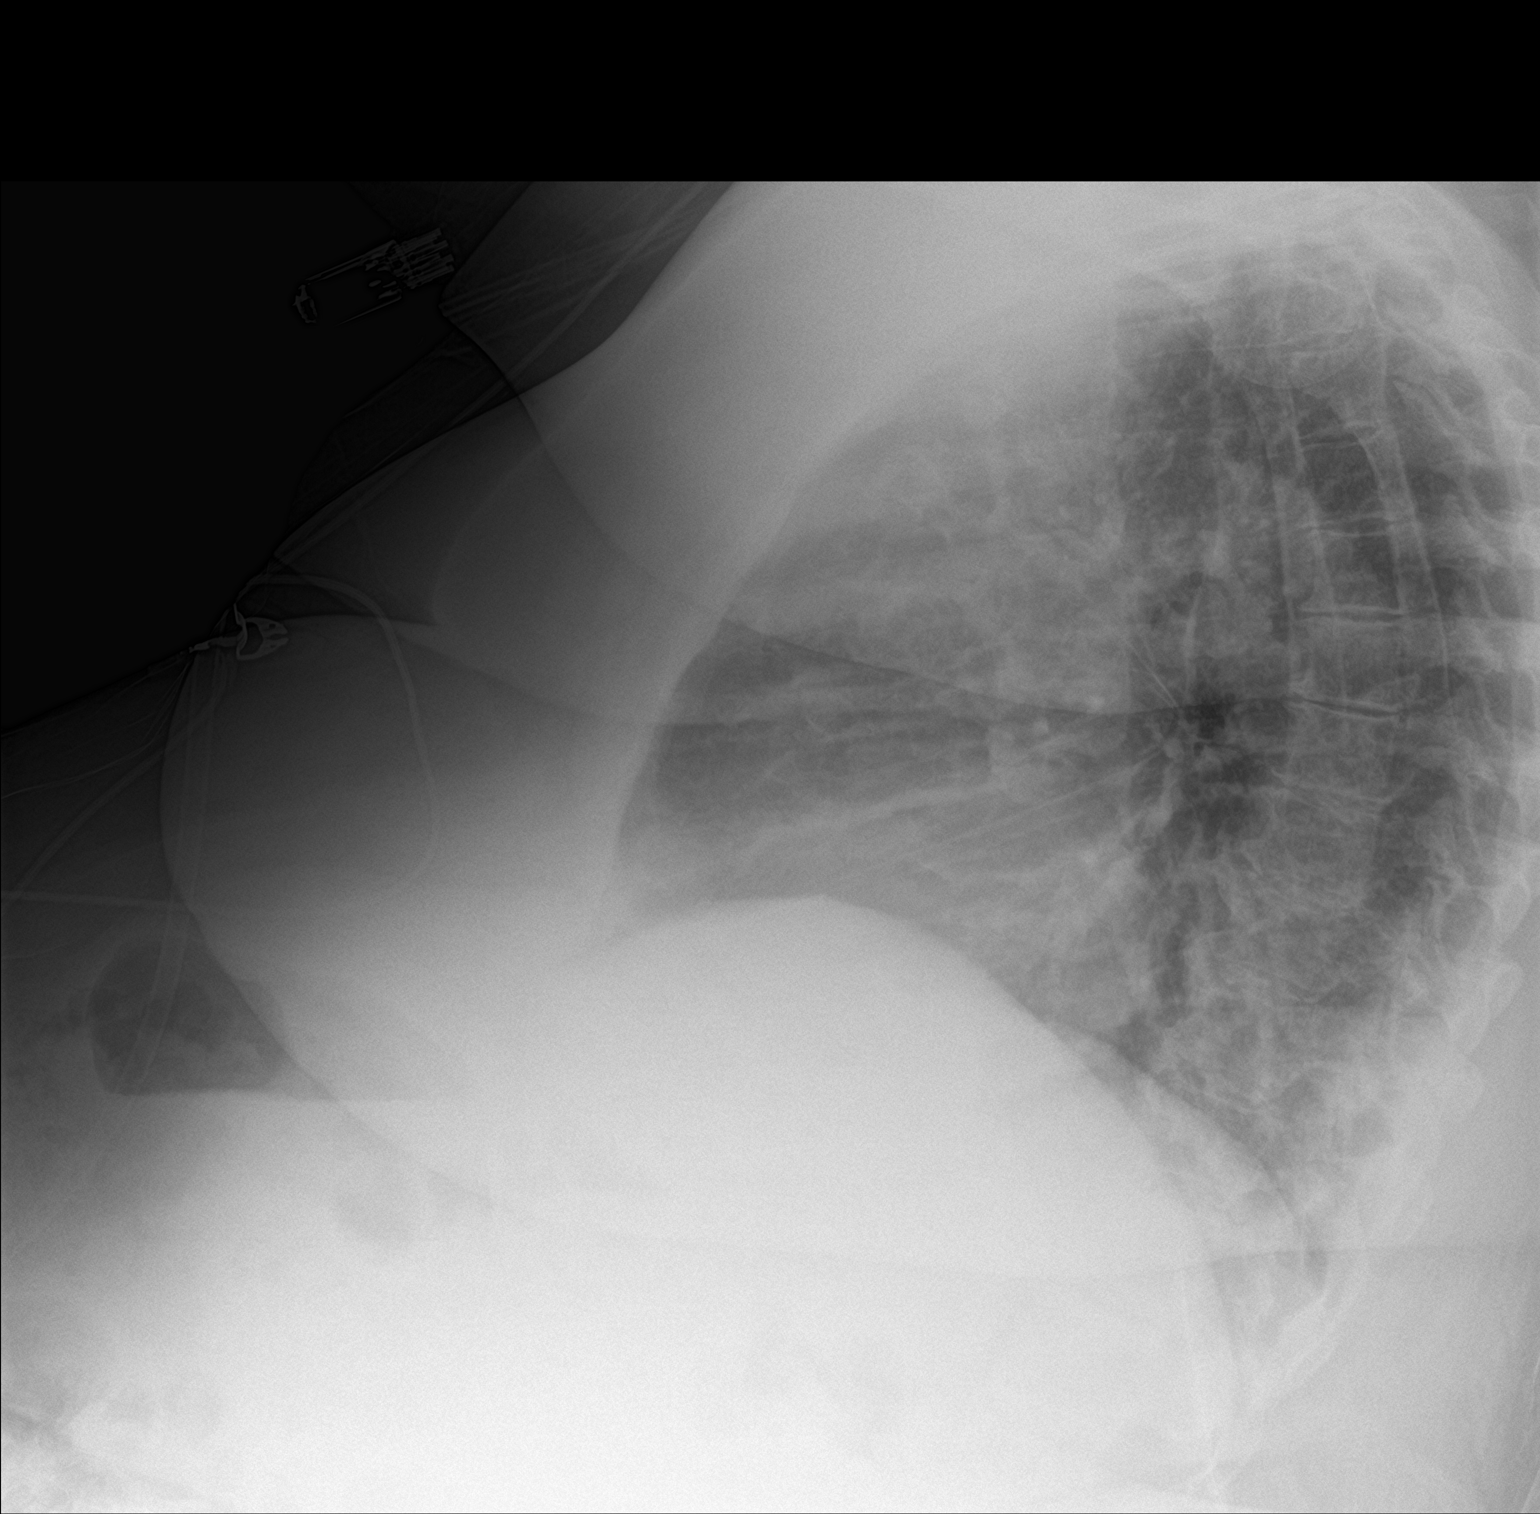

[chest ap]
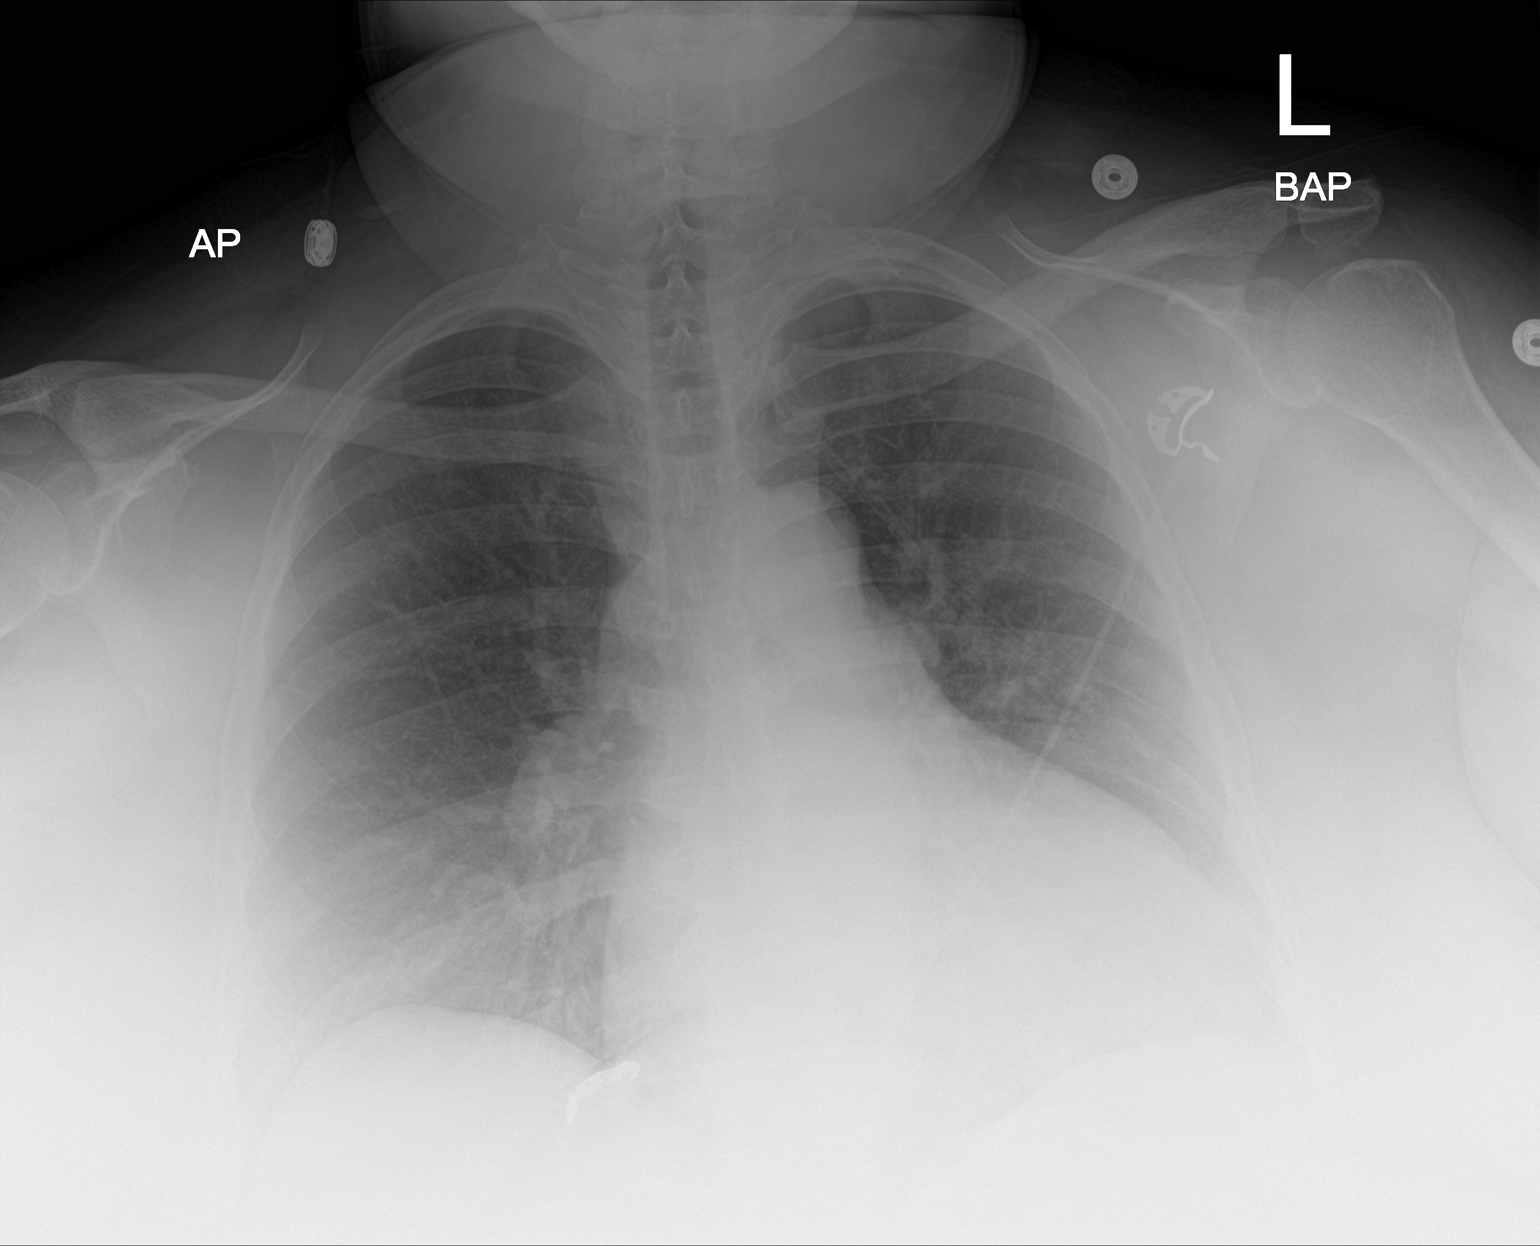

[2 of 2 positions shown; findings below may reference images not displayed]

FINDINGS: Cardiomegaly with mild central vascular congestion. No consolidation
or overt pulmonary edema. No pleural effusion. Degenerative changes
of the spine.
IMPRESSION: Cardiomegaly with mild central vascular congestion.

## 2019-10-01 NOTE — ED Triage Notes (Signed)
Patient arrived stating she has gout and arthritis. Arrived with complaints of back pain when she ambulates. Was told she needed surgery but declines, asking for a shot for pain.

## 2019-10-01 NOTE — ED Provider Notes (Signed)
[facility]    ASSESSMENT & PLAN:  1. Cystitis   2. Right leg pain      Meds ordered this encounter  Medications  . acetaminophen (TYLENOL) 500 MG tablet    Sig: Take 1 tablet (500 mg total) by mouth every 6 (six) hours as needed.    Dispense:  30 tablet    Refill:  0  . cephALEXin (KEFLEX) 500 MG capsule    Sig: Take 1 capsule (500 mg total) by mouth 2 (two) times daily.    Dispense:  10 capsule    Refill:  0   Chronic R leg pain. Tylenol as needed. No signs of pyelonephritis. Discussed. Urine culture sent. Will notify patient when results available. Will follow up with her PCP or here if not showing improvement over the next 48 hours, sooner if needed.  Outlined signs and symptoms indicating need for more acute intervention. Patient verbalized understanding. After Visit Summary given.  SUBJECTIVE:  Tiffany [name] is a 59 y.o. female who complains of burning dysuria and hematuria for 3 days. Seen in ED but unable to fill medication where prescribed. Without associated flank pain, fever, chills.  No specific aggravating or alleviating factors reported. No LE edema. Normal PO intake without n/v/d. Without specific abdominal pain. Chronic R leg pain; soreness currently. No swelling. No new injury.  LMP: No LMP recorded. Patient has had a hysterectomy.   OBJECTIVE:  Vitals:   09/30/19 1537  BP: 131/90  Pulse: 72  Resp: 16  Temp: 98.4 F (36.9 C)  TempSrc: Oral  SpO2: 97%   General appearance: alert; no distress HENT: oropharynx: moist Lungs: unlabored respirations Abdomen: soft, non-tender; bowel sounds normal; no masses or organomegaly; no guarding or rebound tenderness Back: no CVA tenderness Extremities: no edema; symmetrical with no gross deformities; reports R leg pain below knee with exam Skin: warm and dry Neurologic: normal gait Psychological: alert and cooperative; normal mood and affect  Labs Reviewed  POCT URINALYSIS DIPSTICK, ED / UC  - Abnormal; Notable for the following components:      Result Value   Bilirubin Urine SMALL (*)    Ketones, ur 15 (*)    Hgb urine dipstick LARGE (*)    Protein, ur >=300 (*)    Leukocytes,Ua TRACE (*)    All other components within normal limits  URINE CULTURE    Allergies  Allergen Reactions  . Hydrocodone Shortness Of Breath  . Hydrocodone-Acetaminophen Shortness Of Breath  . Hydroxyzine Anaphylaxis and Shortness Of Breath  . Latuda [Lurasidone Hcl] Anaphylaxis  . Lurasidone Anaphylaxis  . Magnesium-Containing Compounds Anaphylaxis  . Prednisone Anaphylaxis, Swelling and Other (See Comments)    Tongue swelling  . Tramadol Anaphylaxis and Swelling  . Codeine Nausea And Vomiting  . Other Rash  . Sulfa Antibiotics Itching  . Tape Rash    Past Medical History:  Diagnosis Date  . Agitation 11/22/2017  . Anoxic brain injury ([place]) 09/08/2016   C. Arrest due to respiratory failure and COPD exacerbation  . Anxiety   . Arthritis    "all over" (04/10/2016)  . Asthma 10/18/2010  . Binge eating disorder   . Cardiac arrest ([place]) 09/08/2016   PEA  . Carotid artery stenosis    1-39% bilateral by dopplers 11/2016  . Chronic diastolic (congestive) heart failure ([place])   . Chronic kidney disease    "I see a kidney dr." (04/10/2016)  . Chronic pain syndrome 06/18/2012  . Chronic post-traumatic stress disorder (PTSD) 05/27/2018  .  Chronic respiratory failure with hypoxia and hypercapnia (HCC) 06/22/2015   TRILOGY Vent >AVAPA-ES., Vt target 200-400, Max P 30 , PS max 20 , PS min 6-10 , E Max 6, E Min 4, Rate Auto AVAPS Rate 2 (titrate for pt comfort) , bleed O2 at 5l/m continuous flow .   [name] CKD (chronic kidney disease) stage 3, GFR 30-59 ml/min 12/15/2016  . Closed displaced fracture of fifth metacarpal bone 03/21/2018  . Cocaine use disorder, severe, in sustained remission ([place]) 12/17/2015  . Complication of anesthesia    decreased bp, decreased heart rate  . COPD (chronic obstructive pulmonary  disease) ([place]) 07/08/2014  . Depression   . Diabetic neuropathy ([place]) 04/24/2011  . Difficulty with speech 01/24/2018  . Disorder of nervous system   . Drug abuse ([place]) 11/21/2017  . Dyslipidemia 04/24/2011  . Elevated troponin 04/28/2012  . Emphysema   . Encephalopathy 11/21/2017  . Essential hypertension 03/22/2016  . Fibula fracture 07/10/2016  . Frequent falls 10/11/2017  . GERD (gastroesophageal reflux disease)   . Gout 04/11/2017  . Heart attack ([place]) 1980s  . History of blood transfusion 1994   "couldn't stop bleeding from my period"  . History of drug abuse in remission ([place]) 11/28/2015   Quit in 2017  . Hyperlipidemia LDL goal <70   . Incontinence   . Manic depression ([place])   . Morbid obesity ([place]) 10/18/2010  . Obstructive sleep apnea 10/18/2010  . On home oxygen therapy    "6L; 24/7" (04/10/2016)  . OSA on CPAP    "wear mask sometimes" (04/10/2016)  . Paranoid ([place])    "sometimes; I'm on RX for it" (04/10/2016)  . Prolonged Q-T interval on ECG   . Rectal bleeding 12/31/2015  . Right carotid bruit 11/09/2016  . Schizoaffective disorder, bipolar type ([place]) 04/05/2018  . Seasonal allergies   . Seborrheic keratoses 12/31/2013  . Seizures ([place])    "don't know what kind; last one was ~ 1 yr ago" (04/10/2016)  . Stroke [facility]) 1980s   denies residual on 04/10/2016  . Thrush 09/19/2013  . Type 2 diabetes mellitus ([place]) 10/18/2010   Social History   Socioeconomic History  . Marital status: Widowed    Spouse name: Not on file  . Number of children: 3  . Years of education: Not on file  . Highest education level: Not on file  Occupational History  . Occupation: disabled    Comment: factory production  Tobacco Use  . Smoking status: Former Smoker    Packs/day: 1.50    Years: 38.00    Pack years: 57.00    Types: Cigarettes    Start date: 03/13/1977    Quit date: 04/10/2016    Years since quitting: 3.4  . Smokeless tobacco: Never Used  Vaping Use  . Vaping Use: Never used  Substance  and Sexual Activity  . Alcohol use: No    Alcohol/week: 0.0 standard drinks  . Drug use: No    Types: Cocaine    Comment: 04/10/2016 "last used cocaine back in November 2017"  . Sexual activity: Not Currently    Birth control/protection: Surgical  Other Topics Concern  . Not on file  Social History Narrative   Has 1 son, [name]   Lives with son and his boyfriend   Her house has ramps and handrails should she ever needs them.    Her mother lives down the street from her and is a good support person in addition to her son.   She  drives herself, has private transportation.    Cocaine free since 02/24/16, smoke free since 04/10/16   Social Determinants of Health   Financial Resource Strain:   . Difficulty of Paying Living Expenses: Not on file  Food Insecurity:   . Worried About Charity fundraiser in the Last Year: Not on file  . Ran Out of Food in the Last Year: Not on file  Transportation Needs:   . Lack of Transportation (Medical): Not on file  . Lack of Transportation (Non-Medical): Not on file  Physical Activity:   . Days of Exercise per Week: Not on file  . Minutes of Exercise per Session: Not on file  Stress:   . Feeling of Stress : Not on file  Social Connections:   . Frequency of Communication with Friends and Family: Not on file  . Frequency of Social Gatherings with Friends and Family: Not on file  . Attends Religious Services: Not on file  . Active Member of Clubs or Organizations: Not on file  . Attends Archivist Meetings: Not on file  . Marital Status: Not on file  Intimate Partner Violence:   . Fear of Current or Ex-Partner: Not on file  . Emotionally Abused: Not on file  . Physically Abused: Not on file  . Sexually Abused: Not on file   Family History  Problem Relation Age of Onset  . Cancer Father        prostate  . Cancer Mother        lung  . Depression Mother   . Depression Sister   . Anxiety disorder Sister   . Schizophrenia Sister   .  Bipolar disorder Sister   . Depression Sister   . Depression Brother   . Heart failure Other        cousin       Vanessa Kick, MD 10/01/19 [phone]

## 2019-10-02 ENCOUNTER — Telehealth (HOSPITAL_COMMUNITY): Payer: Self-pay | Admitting: *Deleted

## 2019-10-02 ENCOUNTER — Emergency Department (HOSPITAL_COMMUNITY)
Admission: EM | Admit: 2019-10-02 | Discharge: 2019-10-02 | Disposition: A | Discharge status: Home / Self Care | Payer: Medicare Other | Attending: Emergency Medicine | Admitting: Emergency Medicine

## 2019-10-02 ENCOUNTER — Ambulatory Visit: Payer: Medicaid Other | Admitting: Family Medicine

## 2019-10-02 ENCOUNTER — Telehealth: Payer: Self-pay | Admitting: *Deleted

## 2019-10-02 DIAGNOSIS — M545 Low back pain, unspecified: Secondary | ICD-10-CM

## 2019-10-02 DIAGNOSIS — G8929 Other chronic pain: Secondary | ICD-10-CM

## 2019-10-02 MED ORDER — ACETAMINOPHEN 325 MG PO TABS: 650.0000 mg | ORAL_TABLET | Freq: Once | ORAL | Status: DC

## 2019-10-02 MED ORDER — LIDOCAINE 5 % EX PTCH
1.0000 | MEDICATED_PATCH | CUTANEOUS | Status: DC
  Administered 2019-10-02: 1 via TRANSDERMAL
  Filled 2019-10-02: qty 1

## 2019-10-02 MED ORDER — ACETAMINOPHEN 500 MG PO TABS
1000.0000 mg | ORAL_TABLET | Freq: Once | ORAL | Status: AC
  Administered 2019-10-02: 1000 mg via ORAL
  Filled 2019-10-02: qty 2

## 2019-10-02 MED ORDER — NAPROXEN 375 MG PO TABS: 375.0000 mg | ORAL_TABLET | Freq: Once | ORAL | Status: DC

## 2019-10-02 NOTE — Discharge Instructions (Addendum)
Please follow up with your orthopaedic surgeon as well as your PCP. Please return to the ER with any new or worsening symptoms.  It was a pleasure to meet you.

## 2019-10-02 NOTE — Telephone Encounter (Signed)
Post ED Visit - Positive Culture Follow-up  Culture report reviewed by antimicrobial stewardship pharmacist: [place] Team []  Elenor Quinones, Pharm.D. []  Heide Guile, Pharm.D., BCPS AQ-ID []  Parks Neptune, Pharm.D., BCPS []  Alycia Rossetti, Pharm.D., BCPS []  [place], Pharm.D., BCPS, AAHIVP []  Legrand Como, Pharm.D., BCPS, AAHIVP [x]  Salome Arnt, PharmD, BCPS []  Johnnette Gourd, PharmD, BCPS []  Hughes Better, PharmD, BCPS []  Leeroy Cha, PharmD []  Laqueta Linden, PharmD, BCPS []  Albertina Parr, PharmD  [place] Team []  Leodis Sias, PharmD []  Lindell Spar, PharmD []  Royetta Asal, PharmD []  Graylin Shiver, Rph []  Rema Fendt) Glennon Mac, PharmD []  Arlyn Dunning, PharmD []  Netta Cedars, PharmD []  Dia Sitter, PharmD []  Leone [place], PharmD []  Gretta Arab, PharmD []  Theodis Shove, PharmD []  Peggyann Juba, PharmD []  Reuel Boom, PharmD   Positive urine culture Treated with Cephalexin, organism sensitive to the same and no further patient follow-up is required at this time.  Harlon Flor [facility] 10/02/2019, 9:01 AM

## 2019-10-02 NOTE — Telephone Encounter (Signed)
Pt presented to clinic today stating that she has just come from [facility] and doesn't have a way home. Pt is requesting to go to assisted living/nursing home, specifically [facility]. Pt will need an FL2 form filled out for placement. Please review and advise.

## 2019-10-02 NOTE — ED Provider Notes (Signed)
[facility] DEPT Provider Note   CSN: [id number] Arrival date & time: 10/01/19  2224     History Chief Complaint  Patient presents with  . Back Pain    Tiffany [name] is a 59 y.o. female.  HPI Patient is a 59 year old female with a medical history as noted below.  Patient most recently seen in the ER 2 days ago for complaints of dysuria as well as chronic shortness of breath.  She was given a dose of fosfomycin and discharged.  She presents today due to acute on chronic lumbar back pain.  No recent falls or injury.  No history of cancer or IVDA, per patient.  Pain worsens with palpation and ambulation.  She states she is supposed to see a "specialist" that is going to give her a shot in her back.  She presents today requesting pain management.  No numbness, tingling, weakness, chest pain, shortness of breath, abdominal pain, vomiting, urinary changes, fevers, chills.    Past Medical History:  Diagnosis Date  . Agitation 11/22/2017  . Anoxic brain injury ([place]) 09/08/2016   C. Arrest due to respiratory failure and COPD exacerbation  . Anxiety   . Arthritis    "all over" (04/10/2016)  . Asthma 10/18/2010  . Binge eating disorder   . Cardiac arrest ([place]) 09/08/2016   PEA  . Carotid artery stenosis    1-39% bilateral by dopplers 11/2016  . Chronic diastolic (congestive) heart failure ([place])   . Chronic kidney disease    "I see a kidney dr." (04/10/2016)  . Chronic pain syndrome 06/18/2012  . Chronic post-traumatic stress disorder (PTSD) 05/27/2018  . Chronic respiratory failure with hypoxia and hypercapnia (HCC) 06/22/2015   TRILOGY Vent >AVAPA-ES., Vt target 200-400, Max P 30 , PS max 20 , PS min 6-10 , E Max 6, E Min 4, Rate Auto AVAPS Rate 2 (titrate for pt comfort) , bleed O2 at 5l/m continuous flow .   [name] CKD (chronic kidney disease) stage 3, GFR 30-59 ml/min 12/15/2016  . Closed displaced fracture of fifth metacarpal bone 03/21/2018  . Cocaine use  disorder, severe, in sustained remission ([place]) 12/17/2015  . Complication of anesthesia    decreased bp, decreased heart rate  . COPD (chronic obstructive pulmonary disease) ([place]) 07/08/2014  . Depression   . Diabetic neuropathy ([place]) 04/24/2011  . Difficulty with speech 01/24/2018  . Disorder of nervous system   . Drug abuse ([place]) 11/21/2017  . Dyslipidemia 04/24/2011  . Elevated troponin 04/28/2012  . Emphysema   . Encephalopathy 11/21/2017  . Essential hypertension 03/22/2016  . Fibula fracture 07/10/2016  . Frequent falls 10/11/2017  . GERD (gastroesophageal reflux disease)   . Gout 04/11/2017  . Heart attack ([place]) 1980s  . History of blood transfusion 1994   "couldn't stop bleeding from my period"  . History of drug abuse in remission ([place]) 11/28/2015   Quit in 2017  . Hyperlipidemia LDL goal <70   . Incontinence   . Manic depression ([place])   . Morbid obesity ([place]) 10/18/2010  . Obstructive sleep apnea 10/18/2010  . On home oxygen therapy    "6L; 24/7" (04/10/2016)  . OSA on CPAP    "wear mask sometimes" (04/10/2016)  . Paranoid ([place])    "sometimes; I'm on RX for it" (04/10/2016)  . Prolonged Q-T interval on ECG   . Rectal bleeding 12/31/2015  . Right carotid bruit 11/09/2016  . Schizoaffective disorder, bipolar type ([place]) 04/05/2018  . Seasonal allergies   .  Seborrheic keratoses 12/31/2013  . Seizures ([place])    "don't know what kind; last one was ~ 1 yr ago" (04/10/2016)  . Stroke [facility]) 1980s   denies residual on 04/10/2016  . Thrush 09/19/2013  . Type 2 diabetes mellitus ([place]) 10/18/2010    Patient Active Problem List   Diagnosis Date Noted  . Family discord 02/04/2019  . Aggressive behavior   . PTSD (post-traumatic stress disorder) 05/27/2018  . Schizoaffective disorder, bipolar type ([place]) 04/05/2018  . Closed displaced fracture of fifth metacarpal bone 03/21/2018  . Difficulty with speech 01/24/2018  . AKI (acute kidney injury) ([place]) 11/21/2017  . Encephalopathy 11/21/2017  .  Drug abuse ([place]) 11/21/2017  . Frequent falls 10/11/2017  . Binge eating disorder   . Dependence on continuous supplemental oxygen 05/14/2017  . Gout 04/11/2017  . CKD (chronic kidney disease) stage 3, GFR 30-59 ml/min 12/15/2016  . Carotid artery stenosis   . Osteoarthritis 10/26/2016  . Anoxic brain injury ([place]) 09/08/2016  . Overactive bladder 06/07/2016  . QT prolongation   . OSA and COPD overlap syndrome ([place])   . Arthritis   . Essential hypertension 03/22/2016  . Cocaine use disorder, severe, in sustained remission ([place]) 12/17/2015  . Bipolar affective disorder, mixed, severe, with psychotic behavior ([place]) 11/28/2015  . History of drug abuse in remission ([place]) 11/28/2015  . Chronic diastolic congestive heart failure ([place])   . Chronic respiratory failure with hypoxia and hypercapnia ([place]) 06/22/2015  . Tobacco use disorder 07/22/2014  . COPD (chronic obstructive pulmonary disease) ([place]) 07/08/2014  . Seizure ([place]) 01/04/2013  . Chronic pain syndrome 06/18/2012  . Dyslipidemia 04/24/2011  . Anemia 04/24/2011  . Diabetic neuropathy ([place]) 04/24/2011  . Asthma 10/18/2010  . Morbid obesity ([place]) 10/18/2010  . Type 2 diabetes mellitus ([place]) 10/18/2010    Past Surgical History:  Procedure Laterality Date  . CESAREAN SECTION  1997  . HERNIA REPAIR    . IR CHOLANGIOGRAM EXISTING TUBE  07/20/2016  . IR PERC CHOLECYSTOSTOMY  05/10/2016  . IR RADIOLOGIST EVAL & MGMT  06/08/2016  . IR RADIOLOGIST EVAL & MGMT  06/29/2016  . IR SINUS/FIST TUBE CHK-NON GI  07/12/2016  . RIGHT/LEFT HEART CATH AND CORONARY ANGIOGRAPHY N/A 06/19/2017   Procedure: RIGHT/LEFT HEART CATH AND CORONARY ANGIOGRAPHY;  Surgeon: Jolaine Artist, MD;  Location: [facility];  Service: Cardiovascular;  Laterality: N/A;  . TIBIA IM NAIL INSERTION Right 07/12/2016   Procedure: INTRAMEDULLARY (IM) NAIL RIGHT TIBIA;  Surgeon: Leandrew Koyanagi, MD;  Location: [facility];  Service: Orthopedics;  Laterality: Right;  . UMBILICAL  HERNIA REPAIR  ~ 1963   "that's why I don't have a belly button"  . VAGINAL HYSTERECTOMY       OB History    Gravida  4   Para  4   Term  3   Preterm  1   AB  0   Living  3     SAB  0   TAB  0   Ectopic  0   Multiple  0   Live Births  3           Family History  Problem Relation Age of Onset  . Cancer Father        prostate  . Cancer Mother        lung  . Depression Mother   . Depression Sister   . Anxiety disorder Sister   . Schizophrenia Sister   . Bipolar disorder Sister   .  Depression Sister   . Depression Brother   . Heart failure Other        cousin    Social History   Tobacco Use  . Smoking status: Former Smoker    Packs/day: 1.50    Years: 38.00    Pack years: 57.00    Types: Cigarettes    Start date: 03/13/1977    Quit date: 04/10/2016    Years since quitting: 3.4  . Smokeless tobacco: Never Used  Vaping Use  . Vaping Use: Never used  Substance Use Topics  . Alcohol use: No    Alcohol/week: 0.0 standard drinks  . Drug use: No    Types: Cocaine    Comment: 04/10/2016 "last used cocaine back in November 2017"    Home Medications Prior to Admission medications   Medication Sig Start Date End Date Taking? Authorizing Provider  acetaminophen (TYLENOL) 500 MG tablet Take 1 tablet (500 mg total) by mouth every 6 (six) hours as needed. 09/30/19   Vanessa Kick, MD  albuterol (PROVENTIL) (2.5 MG/3ML) 0.083% nebulizer solution Take 3 mLs (2.5 mg total) by nebulization every 6 (six) hours as needed for wheezing or shortness of breath. 04/29/19   Magdalen Spatz, NP  albuterol (VENTOLIN HFA) 108 (90 Base) MCG/ACT inhaler Inhale 2 puffs into the lungs every 6 (six) hours as needed for wheezing or shortness of breath. 05/07/19   Martyn Ehrich, NP  amLODipine (NORVASC) 10 MG tablet Take 1 tablet (10 mg total) by mouth daily. 09/08/19   [name], DO  Asenapine Maleate 10 MG SUBL Place 1 tablet (10 mg total) under the tongue in the  morning, at noon, and at bedtime. 08/29/19 11/27/19  Pucilowski, Marchia Bond, MD  atorvastatin (LIPITOR) 80 MG tablet Take 1 tablet (80 mg total) by mouth daily. 12/10/18   Maximiano Coss, NP  cephALEXin (KEFLEX) 500 MG capsule Take 1 capsule (500 mg total) by mouth 2 (two) times daily. 09/30/19   Vanessa Kick, MD  colchicine 0.6 MG tablet Take 1 tablet (0.6 mg total) by mouth 2 (two) times daily. Patient taking differently: Take 1.2 mg by mouth 2 (two) times daily.  09/08/19   [name], DO  diazepam (VALIUM) 10 MG tablet Take 1 tablet (10 mg total) by mouth 3 (three) times daily. 09/01/19 11/30/19  Pucilowski, Marchia Bond, MD  diclofenac Sodium (VOLTAREN) 1 % GEL Apply 2 g topically 4 (four) times daily. 09/10/19   [name], DO  escitalopram (LEXAPRO) 20 MG tablet Take 2 tablets (40 mg total) by mouth daily. 08/29/19 11/27/19  Pucilowski, Marchia Bond, MD  febuxostat (ULORIC) 40 MG tablet Take 1 tablet (40 mg total) by mouth daily. 09/11/19   [name], DO  ferrous sulfate 325 (65 FE) MG tablet Take 1 tablet (325 mg total) by mouth 2 (two) times daily with a meal. 09/08/19   [name], DO  Fluticasone-Umeclidin-Vilant (TRELEGY ELLIPTA) 100-62.5-25 MCG/INH AEPB Inhale 1 puff into the lungs daily. 05/07/19   Martyn Ehrich, NP  gabapentin (NEURONTIN) 600 MG tablet Take 1 tablet (600 mg total) by mouth 2 (two) times daily. 08/04/19   Pucilowski, Marchia Bond, MD  lisdexamfetamine (VYVANSE) 70 MG capsule Take 1 capsule (70 mg total) by mouth daily before breakfast. 09/01/19 10/01/19  Pucilowski, Marchia Bond, MD  metolazone (ZAROXOLYN) 2.5 MG tablet Take 1 tablet (2.5 mg total) by mouth in the morning, at noon, and at bedtime. 09/08/19   [name], DO  montelukast (SINGULAIR) 10 MG tablet Take 10 mg by mouth at bedtime. 03/10/19   [provider]  Multiple Vitamin (MULTIVITAMIN WITH MINERALS) TABS tablet Take 1 tablet by mouth daily.     [provider]  omeprazole (PRILOSEC) 40 MG capsule Take 1 capsule (40 mg total) by mouth 2 (two) times daily. 09/08/19   [name], DO  oxybutynin (DITROPAN-XL) 10 MG 24 hr tablet Take 1 tablet (10 mg total) by mouth at bedtime. 09/08/19   [name], DO  potassium chloride SA (KLOR-CON) 20 MEQ tablet Take 2 tablets (40 mEq total) by mouth 2 (two) times daily. 05/26/19   [name], DO  prazosin (MINIPRESS) 5 MG capsule Take 1 capsule (5 mg total) by mouth 2 (two) times daily. 08/29/19 11/27/19  Pucilowski, Marchia Bond, MD  traZODone (DESYREL) 100 MG tablet Take 1 tablet (100 mg total) by mouth at bedtime. Patient taking differently: Take 100 mg by mouth at bedtime as needed for sleep.  08/29/19   Pucilowski, Marchia Bond, MD  budesonide-formoterol (SYMBICORT) 80-4.5 MCG/ACT inhaler Inhale 2 puffs into the lungs 2 (two) times daily. Patient not taking: Reported on 09/29/2019 11/15/18 09/29/19  Maximiano Coss, NP    Allergies    Hydrocodone, Hydrocodone-acetaminophen, Hydroxyzine, Latuda [lurasidone hcl], Lurasidone, Magnesium-containing compounds, Prednisone, Tramadol, Codeine, Other, Sulfa antibiotics, and Tape  Review of Systems   Review of Systems  All other systems reviewed and are negative. Ten systems reviewed and are negative for acute change, except as noted in the HPI.   Physical Exam Updated Vital Signs BP (!) 125/93 (BP Location: Left Arm)   Pulse 60   Temp 97.9 F (36.6 C) (Oral)   Resp 14   SpO2 99%   Physical Exam Vitals and nursing note reviewed.  Constitutional:      General: [name] not in acute distress.    Appearance: Normal appearance. [name] normal weight. [name] not ill-appearing, toxic-appearing or diaphoretic.  HENT:     Head: Normocephalic and atraumatic.     Right Ear: External ear normal.     Left Ear: External ear normal.     Nose: Nose normal.     Mouth/Throat:     Mouth: Mucous membranes are moist.      Pharynx: Oropharynx is clear. No oropharyngeal exudate or posterior oropharyngeal erythema.  Eyes:     Extraocular Movements: Extraocular movements intact.  Cardiovascular:     Rate and Rhythm: Normal rate and regular rhythm.     Pulses: Normal pulses.     Heart sounds: Murmur heard.  No friction rub. No gallop.      Comments: Systolic murmur.  2+ radial and DP pulses noted bilaterally. Pulmonary:     Effort: Pulmonary effort is normal. No respiratory distress.     Breath sounds: Normal breath sounds. No stridor. No wheezing, rhonchi or rales.  Abdominal:     General: Abdomen is flat.     Tenderness: There is no abdominal tenderness.  Musculoskeletal:        General: Normal range of motion.     Cervical back: Normal range of motion and neck supple. No tenderness.     Right lower leg: No edema.     Left lower leg: No edema.     Comments: No pedal edema appreciated.  Mild diffuse midline lumbar back pain.  No step-off, crepitus, deformity.  Skin:    General: Skin is warm and dry.  Neurological:     General: No  focal deficit present.     Mental Status: [name] alert and oriented to person, place, and time.  Psychiatric:        Mood and Affect: Mood normal.        Behavior: Behavior normal.    ED Results / Procedures / Treatments   Labs (all labs ordered are listed, but only abnormal results are displayed) Labs Reviewed - No data to display  EKG None  Radiology No results found.  Procedures Procedures (including critical care time)  Medications Ordered in ED Medications  lidocaine (LIDODERM) 5 % 1 patch (has no administration in time range)  acetaminophen (TYLENOL) tablet 1,000 mg (has no administration in time range)   ED Course  I have reviewed the triage vital signs and the nursing notes.  Pertinent labs & imaging results that were available during my care of the patient were reviewed by me and considered in my medical decision making (see chart for details).     MDM Rules/Calculators/A&P                          Pt is a 59 y.o. female that presents with a history, physical exam, and ED Clinical Course as noted above.   Patient presents today due to acute on chronic lumbar back pain.  No recent injuries.  Patient afebrile.  Not tachycardic.  Not hypoxic.  Given the chronic nature of the patient's symptoms did not feel imaging was warranted.  Patient given APAP as well as a Lidoderm patch here in the emergency department.  Recommended continued use of Tylenol for pain.  Per records, patient's creatinine is somewhat elevated so recommended to avoid NSAIDs.  She was recently seen by family medicine on August 23.  She was given an ambulatory referral to orthopedic surgery.  Recommended patient follow-up with the orthopedic surgeon.  She understands she can return to the ED with new or worsening symptoms.  Her questions were answered and she was amicable at the time of discharge.  Her vital signs are stable.   An After Visit Summary was printed and given to the patient.  Patient discharged to home/self care.  Condition at discharge: Stable  Note: Portions of this report may have been transcribed using voice recognition software. Every effort was made to ensure accuracy; however, inadvertent computerized transcription errors may be present.   Final Clinical Impression(s) / ED Diagnoses Final diagnoses:  Chronic midline low back pain, unspecified whether sciatica present   Rx / DC Orders ED Discharge Orders    None       Rayna Sexton, PA-C 10/02/19 0754    Fredia Sorrow, MD 10/24/19 [phone]

## 2019-10-03 NOTE — Telephone Encounter (Signed)
I can help with that (fill dx, meds and sign) but tomorrow at the earliest.

## 2019-10-06 ENCOUNTER — Other Ambulatory Visit: Payer: Self-pay

## 2019-10-06 ENCOUNTER — Telehealth (INDEPENDENT_AMBULATORY_CARE_PROVIDER_SITE_OTHER): Payer: Medicare Other | Admitting: Psychiatry

## 2019-10-06 DIAGNOSIS — F250 Schizoaffective disorder, bipolar type: Secondary | ICD-10-CM | POA: Diagnosis not present

## 2019-10-06 DIAGNOSIS — F5081 Binge eating disorder: Secondary | ICD-10-CM | POA: Diagnosis not present

## 2019-10-06 DIAGNOSIS — F4310 Post-traumatic stress disorder, unspecified: Secondary | ICD-10-CM | POA: Diagnosis not present

## 2019-10-06 MED ORDER — LISDEXAMFETAMINE DIMESYLATE 70 MG PO CAPS
70.0000 mg | ORAL_CAPSULE | Freq: Every day | ORAL | 0 refills | Status: DC
Start: 2019-10-06 — End: 2019-11-06

## 2019-10-06 MED ORDER — DIAZEPAM 10 MG PO TABS: 10.0000 mg | ORAL_TABLET | Freq: Three times a day (TID) | ORAL | 2 refills | Status: DC

## 2019-10-06 MED ORDER — PRAZOSIN HCL 5 MG PO CAPS: 5.0000 mg | ORAL_CAPSULE | Freq: Two times a day (BID) | ORAL | 2 refills | Status: DC

## 2019-10-06 MED ORDER — TRAZODONE HCL 100 MG PO TABS
100.0000 mg | ORAL_TABLET | Freq: Every evening | ORAL | 2 refills | Status: DC | PRN
Start: 2019-10-06 — End: 2019-12-10

## 2019-10-06 MED ORDER — ASENAPINE MALEATE 10 MG SL SUBL
10.0000 mg | SUBLINGUAL_TABLET | Freq: Three times a day (TID) | SUBLINGUAL | 2 refills | Status: DC
Start: 2019-10-06 — End: 2019-11-06

## 2019-10-06 NOTE — Telephone Encounter (Signed)
Pt called asking for a UTI medication stated wallace was going to send it since last visit [phone]

## 2019-10-06 NOTE — Progress Notes (Signed)
[facility] MD/PA/NP OP Progress Note  10/06/2019 1:25 PM Tiffany [name]  MRN:  [id number] Interview was conducted by phone and I verified that I was speaking with the correct person using two identifiers. I discussed the limitations of evaluation and management by telemedicine and  the availability of in person appointments. Patient expressed understanding and agreed to proceed. Patient location - home; physician - home office.  Chief Complaint: "I need a lot of help these days".  HPI: 59yo divorced AAF with schizoaffective disorder bipolar type(vs paranoid schizophrenia),chronicPTSD,remote hx ofcocaineaddiction (clean for4years)and bingeeating disorder.She tried ziprazidone but developedQTC prolongation on itso it was changed toSaphriswhich she believes is a best medication she has ever been on. [name] also on escitalopram andwas onVyvanse for binge eating disorder. Tiffany [name] used to weigh over 300 lbs and since starting Vyvanse her binge eating is well controlled and she was able to lose a lot of weight). She continues to take diazepam 10 mgtidand trazodone for sleep (100 mg plus 50 mg in am "for nerves").She has a strained relationship with her family - mother in particular, but also son. [facility] to [facility] few months ago "to get away from my mother". Unfortunately she has been struggling there. She has been to ED multiple times with various pain complaints. She was also in a psychiatric ward with increased paranoia. It appears that she has been off her psychotropic meds and has become more depressed and more paranoid than during her last visit here. [name] now in rehab (chronic leg pain, gout). She claims that she has not been getting Vyvanse, prazosin or trazodone there. She has not establish care with any local psychiatrist there and then decided to return to [facility] and is back with her mother. [name] not comfortable there (chronic paranoid thoughts about family members  and she wants to move to a assisted living facility or better nursing home. I am not sure if she qualifies for that level of care and advised her to speak with her PCP Dr. Phill Myron. Tiffany [name] insists that we "just complete papers reg psychiatric diagnoses, medications and that she is emotionally stable" so she can start process of getting placed. I doubt if she will succeed with this alone but we will do it. She also is asking for a letter of recommendation for her to ger an emotional support animal - again we will do it but I doubt if she would be able to keep such an animal in an assisted living place or NH. She has multiple medical problems and mobility limitations as far as I know. I do not know what her current non-psychiatric medications are either and do not feel competent to fill out FL2 regarding medical treatment needs. She has appointment with Dr. Juleen China scheduled she said. Tiffany [name] also wants to resume counseling with Lise Auer and I suggested that she calls out front desk to inquire about that. She sound less paranoid than durinmg last few visits and reports being compliant with her medications.    Visit Diagnosis:    ICD-10-CM   1. PTSD (post-traumatic stress disorder)  F43.10   2. Schizoaffective disorder, bipolar type ([place])  F25.0   3. Binge eating disorder  F50.81     Past Psychiatric History: Please see intake H&P.  Past Medical History:  Past Medical History:  Diagnosis Date  . Agitation 11/22/2017  . Anoxic brain injury ([place]) 09/08/2016   C. Arrest due to respiratory failure and COPD exacerbation  . Anxiety   .  Arthritis    "all over" (04/10/2016)  . Asthma 10/18/2010  . Binge eating disorder   . Cardiac arrest ([place]) 09/08/2016   PEA  . Carotid artery stenosis    1-39% bilateral by dopplers 11/2016  . Chronic diastolic (congestive) heart failure ([place])   . Chronic kidney disease    "I see a kidney dr." (04/10/2016)  . Chronic pain syndrome 06/18/2012  . Chronic  post-traumatic stress disorder (PTSD) 05/27/2018  . Chronic respiratory failure with hypoxia and hypercapnia (HCC) 06/22/2015   TRILOGY Vent >AVAPA-ES., Vt target 200-400, Max P 30 , PS max 20 , PS min 6-10 , E Max 6, E Min 4, Rate Auto AVAPS Rate 2 (titrate for pt comfort) , bleed O2 at 5l/m continuous flow .   [name] CKD (chronic kidney disease) stage 3, GFR 30-59 ml/min 12/15/2016  . Closed displaced fracture of fifth metacarpal bone 03/21/2018  . Cocaine use disorder, severe, in sustained remission ([place]) 12/17/2015  . Complication of anesthesia    decreased bp, decreased heart rate  . COPD (chronic obstructive pulmonary disease) ([place]) 07/08/2014  . Depression   . Diabetic neuropathy ([place]) 04/24/2011  . Difficulty with speech 01/24/2018  . Disorder of nervous system   . Drug abuse ([place]) 11/21/2017  . Dyslipidemia 04/24/2011  . Elevated troponin 04/28/2012  . Emphysema   . Encephalopathy 11/21/2017  . Essential hypertension 03/22/2016  . Fibula fracture 07/10/2016  . Frequent falls 10/11/2017  . GERD (gastroesophageal reflux disease)   . Gout 04/11/2017  . Heart attack ([place]) 1980s  . History of blood transfusion 1994   "couldn't stop bleeding from my period"  . History of drug abuse in remission ([place]) 11/28/2015   Quit in 2017  . Hyperlipidemia LDL goal <70   . Incontinence   . Manic depression ([place])   . Morbid obesity ([place]) 10/18/2010  . Obstructive sleep apnea 10/18/2010  . On home oxygen therapy    "6L; 24/7" (04/10/2016)  . OSA on CPAP    "wear mask sometimes" (04/10/2016)  . Paranoid ([place])    "sometimes; I'm on RX for it" (04/10/2016)  . Prolonged Q-T interval on ECG   . Rectal bleeding 12/31/2015  . Right carotid bruit 11/09/2016  . Schizoaffective disorder, bipolar type ([place]) 04/05/2018  . Seasonal allergies   . Seborrheic keratoses 12/31/2013  . Seizures ([place])    "don't know what kind; last one was ~ 1 yr ago" (04/10/2016)  . Stroke [facility]) 1980s   denies residual on 04/10/2016  . Thrush  09/19/2013  . Type 2 diabetes mellitus ([place]) 10/18/2010    Past Surgical History:  Procedure Laterality Date  . CESAREAN SECTION  1997  . HERNIA REPAIR    . IR CHOLANGIOGRAM EXISTING TUBE  07/20/2016  . IR PERC CHOLECYSTOSTOMY  05/10/2016  . IR RADIOLOGIST EVAL & MGMT  06/08/2016  . IR RADIOLOGIST EVAL & MGMT  06/29/2016  . IR SINUS/FIST TUBE CHK-NON GI  07/12/2016  . RIGHT/LEFT HEART CATH AND CORONARY ANGIOGRAPHY N/A 06/19/2017   Procedure: RIGHT/LEFT HEART CATH AND CORONARY ANGIOGRAPHY;  Surgeon: Jolaine Artist, MD;  Location: [facility];  Service: Cardiovascular;  Laterality: N/A;  . TIBIA IM NAIL INSERTION Right 07/12/2016   Procedure: INTRAMEDULLARY (IM) NAIL RIGHT TIBIA;  Surgeon: Leandrew Koyanagi, MD;  Location: [facility];  Service: Orthopedics;  Laterality: Right;  . UMBILICAL HERNIA REPAIR  ~ 1963   "that's why I don't have a belly button"  . VAGINAL HYSTERECTOMY  Family Psychiatric History: Reviewed.  Family History:  Family History  Problem Relation Age of Onset  . Cancer Father        prostate  . Cancer Mother        lung  . Depression Mother   . Depression Sister   . Anxiety disorder Sister   . Schizophrenia Sister   . Bipolar disorder Sister   . Depression Sister   . Depression Brother   . Heart failure Other        cousin    Social History:  Social History   Socioeconomic History  . Marital status: Widowed    Spouse name: Not on file  . Number of children: 3  . Years of education: Not on file  . Highest education level: Not on file  Occupational History  . Occupation: disabled    Comment: factory production  Tobacco Use  . Smoking status: Former Smoker    Packs/day: 1.50    Years: 38.00    Pack years: 57.00    Types: Cigarettes    Start date: 03/13/1977    Quit date: 04/10/2016    Years since quitting: 3.4  . Smokeless tobacco: Never Used  Vaping Use  . Vaping Use: Never used  Substance and Sexual Activity  . Alcohol use: No     Alcohol/week: 0.0 standard drinks  . Drug use: No    Types: Cocaine    Comment: 04/10/2016 "last used cocaine back in November 2017"  . Sexual activity: Not Currently    Birth control/protection: Surgical  Other Topics Concern  . Not on file  Social History Narrative   Has 1 son, [name]   Lives with son and his boyfriend   Her house has ramps and handrails should she ever needs them.    Her mother lives down the street from her and is a good support person in addition to her son.   She drives herself, has private transportation.    Cocaine free since 02/24/16, smoke free since 04/10/16   Social Determinants of Health   Financial Resource Strain:   . Difficulty of Paying Living Expenses: Not on file  Food Insecurity:   . Worried About Charity fundraiser in the Last Year: Not on file  . Ran Out of Food in the Last Year: Not on file  Transportation Needs:   . Lack of Transportation (Medical): Not on file  . Lack of Transportation (Non-Medical): Not on file  Physical Activity:   . Days of Exercise per Week: Not on file  . Minutes of Exercise per Session: Not on file  Stress:   . Feeling of Stress : Not on file  Social Connections:   . Frequency of Communication with Friends and Family: Not on file  . Frequency of Social Gatherings with Friends and Family: Not on file  . Attends Religious Services: Not on file  . Active Member of Clubs or Organizations: Not on file  . Attends Archivist Meetings: Not on file  . Marital Status: Not on file    Allergies:  Allergies  Allergen Reactions  . Hydrocodone Shortness Of Breath  . Hydrocodone-Acetaminophen Shortness Of Breath  . Hydroxyzine Anaphylaxis and Shortness Of Breath  . Latuda [Lurasidone Hcl] Anaphylaxis  . Lurasidone Anaphylaxis  . Magnesium-Containing Compounds Anaphylaxis  . Prednisone Anaphylaxis, Swelling and Other (See Comments)    Tongue swelling  . Tramadol Anaphylaxis and Swelling  . Codeine Nausea And  Vomiting  . Other Rash  .  Sulfa Antibiotics Itching  . Tape Rash    Metabolic Disorder Labs: Lab Results  Component Value Date   HGBA1C 5.6 11/08/2018   MPG 125.5 01/07/2017   MPG 111.15 11/03/2016   No results found for: PROLACTIN Lab Results  Component Value Date   CHOL 127 08/05/2018   TRIG 53 08/05/2018   HDL 62 08/05/2018   CHOLHDL 2.0 08/05/2018   VLDL 38 01/07/2017   LDLCALC 54 08/05/2018   LDLCALC 56 01/07/2017   Lab Results  Component Value Date   TSH 0.563 03/15/2016   TSH 1.060 12/04/2015    Therapeutic Level Labs: No results found for: LITHIUM Lab Results  Component Value Date   VALPROATE 20 (L) 09/09/2016   VALPROATE 46 (L) 07/10/2016   No components found for:  CBMZ  Current Medications: Current Outpatient Medications  Medication Sig Dispense Refill  . acetaminophen (TYLENOL) 500 MG tablet Take 1 tablet (500 mg total) by mouth every 6 (six) hours as needed. 30 tablet 0  . albuterol (PROVENTIL) (2.5 MG/3ML) 0.083% nebulizer solution Take 3 mLs (2.5 mg total) by nebulization every 6 (six) hours as needed for wheezing or shortness of breath. 75 mL 5  . albuterol (VENTOLIN HFA) 108 (90 Base) MCG/ACT inhaler Inhale 2 puffs into the lungs every 6 (six) hours as needed for wheezing or shortness of breath. 18 g 2  . amLODipine (NORVASC) 10 MG tablet Take 1 tablet (10 mg total) by mouth daily. 90 tablet 0  . Asenapine Maleate 10 MG SUBL Place 1 tablet (10 mg total) under the tongue in the morning, at noon, and at bedtime. 90 tablet 2  . atorvastatin (LIPITOR) 80 MG tablet Take 1 tablet (80 mg total) by mouth daily. 90 tablet 0  . cephALEXin (KEFLEX) 500 MG capsule Take 1 capsule (500 mg total) by mouth 2 (two) times daily. 10 capsule 0  . colchicine 0.6 MG tablet Take 1 tablet (0.6 mg total) by mouth 2 (two) times daily. (Patient taking differently: Take 1.2 mg by mouth 2 (two) times daily. ) 180 tablet 0  . diazepam (VALIUM) 10 MG tablet Take 1 tablet (10 mg  total) by mouth 3 (three) times daily. 90 tablet 2  . diclofenac Sodium (VOLTAREN) 1 % GEL Apply 2 g topically 4 (four) times daily. 350 g 0  . escitalopram (LEXAPRO) 20 MG tablet Take 2 tablets (40 mg total) by mouth daily. 180 tablet 0  . febuxostat (ULORIC) 40 MG tablet Take 1 tablet (40 mg total) by mouth daily. 90 tablet 0  . ferrous sulfate 325 (65 FE) MG tablet Take 1 tablet (325 mg total) by mouth 2 (two) times daily with a meal. 180 tablet 0  . Fluticasone-Umeclidin-Vilant (TRELEGY ELLIPTA) 100-62.5-25 MCG/INH AEPB Inhale 1 puff into the lungs daily. 60 each 3  . gabapentin (NEURONTIN) 600 MG tablet Take 1 tablet (600 mg total) by mouth 2 (two) times daily. 180 tablet 1  . lisdexamfetamine (VYVANSE) 70 MG capsule Take 1 capsule (70 mg total) by mouth daily before breakfast. 30 capsule 0  . metolazone (ZAROXOLYN) 2.5 MG tablet Take 1 tablet (2.5 mg total) by mouth in the morning, at noon, and at bedtime. 270 tablet 0  . montelukast (SINGULAIR) 10 MG tablet Take 10 mg by mouth at bedtime.    . Multiple Vitamin (MULTIVITAMIN WITH MINERALS) TABS tablet Take 1 tablet by mouth daily.    [name] omeprazole (PRILOSEC) 40 MG capsule Take 1 capsule (40 mg total) by mouth 2 (two)  times daily. 180 capsule 0  . oxybutynin (DITROPAN-XL) 10 MG 24 hr tablet Take 1 tablet (10 mg total) by mouth at bedtime. 90 tablet 0  . potassium chloride SA (KLOR-CON) 20 MEQ tablet Take 2 tablets (40 mEq total) by mouth 2 (two) times daily. 360 tablet 1  . prazosin (MINIPRESS) 5 MG capsule Take 1 capsule (5 mg total) by mouth 2 (two) times daily. 60 capsule 2  . traZODone (DESYREL) 100 MG tablet Take 1 tablet (100 mg total) by mouth at bedtime as needed for sleep. 30 tablet 2   No current facility-administered medications for this visit.     Psychiatric Specialty Exam: Review of Systems  Genitourinary: Positive for dysuria.  Musculoskeletal: Positive for gait problem.  Psychiatric/Behavioral: Positive for sleep  disturbance. The patient is nervous/anxious.   All other systems reviewed and are negative.   There were no vitals taken for this visit.There is no height or weight on file to calculate BMI.  General Appearance: NA  Eye Contact:  NA  Speech:  Clear and Coherent and Normal Rate  Volume:  Normal  Mood:  Anxious  Affect:  NA  Thought Process:  Goal Directed  Orientation:  Full (Time, Place, and Person)  Thought Content: Paranoid Ideation   Suicidal Thoughts:  No  Homicidal Thoughts:  No  Memory:  Immediate;   Good Recent;   Good Remote;   Good  Judgement:  Fair  Insight:  Fair  Psychomotor Activity:  NA  Concentration:  Concentration: Fair  Recall:  Good  Fund of Knowledge: Good  Language: Good  Akathisia:  Negative  Handed:  Right  AIMS (if indicated): not done  Assets:  Desire for Improvement Financial Resources/Insurance Resilience  ADL's:  Intact  Cognition: WNL  Sleep:  Fair   Screenings: AUDIT     Admission (Discharged) from 12/01/2015 in [place]  Alcohol Use Disorder Identification Test Final Score (AUDIT) 0    GAD-7     Office Visit from 03/21/2018 in [place] for [facility]  Total GAD-7 Score 4    PHQ2-9     Office Visit from confidential encounter on 12/11/2018 Office Visit from confidential encounter on 12/10/2018 Office Visit from confidential encounter on 11/18/2018 Office Visit from confidential encounter on 11/08/2018 Telemedicine from confidential encounter on 10/25/2018  PHQ-2 Total Score 0 4 2 1 4   PHQ-9 Total Score -- 10 8 1 16        Assessment and Plan: 59yo divorced AAF with schizoaffective disorder bipolar type(vs paranoid schizophrenia),chronicPTSD,remote hx ofcocaineaddiction (clean for4years)and bingeeating disorder.She tried ziprazidone but developedQTC prolongation on itso it was changed toSaphriswhich she believes is a best medication she has ever been on. [name] also on escitalopram  andwas onVyvanse for binge eating disorder. [place] used to weigh over 300 lbs and since starting Vyvanse her binge eating is well controlled and she was able to lose a lot of weight). She continues to take diazepam 10 mgtidand trazodone for sleep (100 mg plus 50 mg in am "for nerves").She has a strained relationship with her family - mother in particular, but also son. Jillhasmoved to [facility] few months ago "to get away from my mother". Unfortunately she has been struggling there. She has been to ED multiple times with various pain complaints. She was also in a psychiatric ward with increased paranoia. It appears that she has been off her psychotropic meds and has become more depressed and more paranoid than during her last visit here. She  is now in rehab (chronic leg pain, gout). She claimed that she was not been getting Vyvanse, prazosin or trazodone there. She did not establish care with any local psychiatrist there and then decided to return to [facility] and is back with her mother. [name] not comfortable there (chronic paranoid thoughts about family members and she wants to move to a assisted living facility or better nursing home. I am not sure if she qualifies for that level of care and advised her to speak with her PCP Dr. Phill Myron. Tiffany [name] insists that we "just complete papers reg psychiatric diagnoses, medications and that she is emotionally stable" so she can start process of getting placed. I doubt if she will succeed with this alone but we will do it. She also is asking for a letter of recommendation for her to ger an emotional support animal - again we will do it but I doubt if she would be able to keep such an animal in an assisted living place or NH. She has multiple medical problems and mobility limitations as far as I know. I do not know what her current non-psychiatric medications are either and do not feel competent to fill out FL2 regarding medical treatment needs. She has  appointment with Dr. Juleen China scheduled she said. Tiffany [name] also wants to resume counseling with Lise Auer and I suggested that she calls out front desk to inquire about that. She sound less paranoid than durinmg last few visits and reports being compliant with her medications.   Dx: Schizoaffective disorder bipolar type; PTSD chronic  Plan: Continue current meds: Lexapro 40 mg, trazodone,Saphristo 10 mgtid,diazepam,Vyvanse,prazosin 5 mg bid, gabapentin 600 bid.Next appointment in3weeks. The plan was discussed with patient who had an opportunity to ask questions and these were all answered.I spend48minutes in phone consultation with the patient.    Stephanie Acre, MD 10/06/2019, 1:25 PM

## 2019-10-06 NOTE — Telephone Encounter (Signed)
This was not discussed during last visit. She will need to go to urgent care.

## 2019-10-07 ENCOUNTER — Telehealth: Payer: Self-pay | Admitting: Licensed Clinical Social Worker

## 2019-10-07 NOTE — Telephone Encounter (Signed)
Call placed to patient, per request from [employer]. LCSW introduced self and explained role at [facility].  Pt reports that she has an established psychiatrist to assist with medication management. [name] interested in trauma focused therapy. Pt also wants to discuss prescription for an electric wheelchair and a rollater to assist with ambulating.   Pt provided verbal consent for LCSW to complete referral to [facility] for long-term therapy and housing referral to [facility] to assist with independent housing. Pt currently utilizes Medicaid and [employer] for transportation to appointments. No additional concerns noted.   LCSW strongly encouraged pt to contact her with any additional behavioral health and/or resource needs. Pt appreciative for the assistance.

## 2019-10-08 ENCOUNTER — Encounter (HOSPITAL_COMMUNITY): Payer: Self-pay

## 2019-10-09 ENCOUNTER — Telehealth (HOSPITAL_COMMUNITY): Payer: Self-pay

## 2019-10-09 ENCOUNTER — Other Ambulatory Visit: Payer: Self-pay

## 2019-10-09 ENCOUNTER — Ambulatory Visit (HOSPITAL_COMMUNITY): Payer: Medicare Other | Admitting: Clinical

## 2019-10-09 ENCOUNTER — Ambulatory Visit (INDEPENDENT_AMBULATORY_CARE_PROVIDER_SITE_OTHER): Payer: Medicare Other | Admitting: Emergency Medicine

## 2019-10-09 ENCOUNTER — Encounter: Payer: Self-pay | Admitting: Emergency Medicine

## 2019-10-09 DIAGNOSIS — G4733 Obstructive sleep apnea (adult) (pediatric): Secondary | ICD-10-CM | POA: Diagnosis not present

## 2019-10-09 DIAGNOSIS — J9612 Chronic respiratory failure with hypercapnia: Secondary | ICD-10-CM | POA: Diagnosis not present

## 2019-10-09 DIAGNOSIS — J9611 Chronic respiratory failure with hypoxia: Secondary | ICD-10-CM | POA: Diagnosis not present

## 2019-10-09 DIAGNOSIS — J42 Unspecified chronic bronchitis: Secondary | ICD-10-CM

## 2019-10-09 DIAGNOSIS — J449 Chronic obstructive pulmonary disease, unspecified: Secondary | ICD-10-CM | POA: Diagnosis not present

## 2019-10-09 MED ORDER — TRELEGY ELLIPTA 100-62.5-25 MCG/INH IN AEPB: 1.0000 | INHALATION_SPRAY | Freq: Every day | RESPIRATORY_TRACT | 3 refills | Status: DC

## 2019-10-09 MED ORDER — ALBUTEROL SULFATE HFA 108 (90 BASE) MCG/ACT IN AERS: 2.0000 | INHALATION_SPRAY | Freq: Four times a day (QID) | RESPIRATORY_TRACT | 2 refills | Status: DC | PRN

## 2019-10-09 NOTE — Telephone Encounter (Signed)
error 

## 2019-10-09 NOTE — Addendum Note (Signed)
Addended by: Gavin Potters R on: 10/09/2019 04:47 PM   Modules accepted: Orders

## 2019-10-09 NOTE — Assessment & Plan Note (Signed)
Please continue your Trelegy 1 inhalation once daily.  Rinse and gargle after you use this. Use albuterol 2 puffs when you need it for shortness of breath, chest tightness, wheezing. Follow with Dr Lamonte Sakai in 6 months or sooner if you have any problems

## 2019-10-09 NOTE — Patient Instructions (Signed)
We need to get you back on your trilogy ventilator to wear every night. We will continue oxygen at 2 L/min Please continue your Trelegy 1 inhalation once daily.  Rinse and gargle after you use this. Use albuterol 2 puffs when you need it for shortness of breath, chest tightness, wheezing. Follow with Dr Lamonte Sakai in 6 months or sooner if you have any problems

## 2019-10-09 NOTE — Progress Notes (Signed)
Subjective:    Patient ID: Tiffany [name], female    DOB: 09-26-1960, 59 y.o.   MRN: [id number]  HPI  ROV 7/42/59 --complicated case due to overlapping issues and difficulty with compliance.  [name] 16, has obesity, schizophrenia, COPD with a positive bronchodilator response.  She has OSA/OHS with mild associated pulmonary hypertension.  She has resultant daytime hypoxemia and requires a trilogy ventilator nightly.  She was last seen here in November after hospitalization, has sustained multiple falls - ? Medication effect, oversedation and required intubation in early Nov. She reports multiple problems today. She has had been having falls, has unsteadiness and dyspnea when she ambulates. [name] on Trelegy, feels that it helps her. Uses albuterol rarely. She reports good compliance with her Trilogy, wears it reliably - she is concerned that she may not be receiving high enough pressures.   ROV 03/12/2018 --59 year old woman with obesity, schizophrenia, COPD with an asthmatic component, OSA/OHS with resultant daytime hypoxemia and hypoventilation.  [name] on a trilogy ventilator at night, good compliance.  She has been having falls, has had issues with oversedation.  A review of the chart shows that she has been in the Urgent Care 6 times since last time I saw her only 6 weeks ago. She remains on Trelegy > tolerating and benefiting from it, needs a new script for this. [name] interested in getting a POC, trading in her O2 tanks. A review of the notes shows that Dr Andria Frames is exploring residential care. Not clear to me that pt will go along with this willingly.   ROV 10/09/19 --59 year old obese woman with schizophrenia, COPD, OSA/OHS with associated chronic hypoxic and hypercapnic respiratory failure.  [name] supported with a trilogy ventilator at night and describes good compliance.  [name] on Trelegy, has albuterol which she uses approximately 2-3x a day. [name] having some mucous, occasional SOB.  [name]  on oxygen at 2 L/min. She has been admitted recently for UTI's due to chronic diarrhea. [name] dealing with spinal stenosis and LE pain. [name] going to restart her Trilogy ventilator - has not yet restarted it post [facility] hospitalization.     Review of Systems  Constitutional: Positive for unexpected weight change. Negative for fever.  HENT: Positive for dental problem and sinus pressure. Negative for congestion, ear pain, nosebleeds, postnasal drip, rhinorrhea, sneezing, sore throat and trouble swallowing.   Eyes: Negative.  Negative for redness and itching.  Respiratory: Positive for cough, shortness of breath and wheezing. Negative for chest tightness.   Cardiovascular: Positive for leg swelling. Negative for palpitations.  Gastrointestinal: Negative.  Negative for nausea and vomiting.  Endocrine: Negative.   Genitourinary: Negative.  Negative for dysuria.  Musculoskeletal: Positive for joint swelling.  Skin: Negative.  Negative for rash.  Allergic/Immunologic: Negative.   Neurological: Negative.  Negative for headaches.  Hematological: Bruises/bleeds easily.  Psychiatric/Behavioral: Negative.  Negative for dysphoric mood. The patient is not nervous/anxious.       Objective:   Physical Exam Vitals:   10/09/19 1602  BP: 124/73  Pulse: 63  Temp: 98.3 F (36.8 C)  TempSrc: Temporal  SpO2: 97%  Weight: 152 lb 4.8 oz (69.1 kg)  Height: 5\' 2"  (1.575 m)   Gen: Pleasant, overwt, in no distress, tangential affect, in wheelchair  ENT: No lesions,  mouth clear,  oropharynx clear, no postnasal drip  Neck: No JVD, no stridor  Lungs: No use of accessory muscles, distant, no wheeze   Cardiovascular:  RRR, heart sounds normal, no murmur or gallops, trace LE  peripheral edema  Musculoskeletal: No deformities, no cyanosis or clubbing  Neuro: alert, a bit slow to respond, moves her UE, weak B LE's  Skin: Warm, no lesions or rashes       Assessment & Plan:  COPD (chronic obstructive  pulmonary disease) ([place]) Please continue your Trelegy 1 inhalation once daily.  Rinse and gargle after you use this. Use albuterol 2 puffs when you need it for shortness of breath, chest tightness, wheezing. Follow with Dr Lamonte Sakai in 6 months or sooner if you have any problems  Chronic respiratory failure with hypoxia and hypercapnia (HCC) We need to get you back on your trilogy ventilator to wear every night. We will continue oxygen at 2 L/min  OSA and COPD overlap syndrome ([place]) She has been at behavioral health.  She just got out and has not yet restarted her trilogy ventilator.  She has to get out of storage.  I strongly encouraged her to do this as her respiratory status has been much more stable since she has been compliant.  She stated that she go to work on this  Baltazar Apo, MD, PhD 10/09/2019, 4:18 PM [facility] [place] [phone] or if no answer [phone]

## 2019-10-09 NOTE — Assessment & Plan Note (Signed)
She has been at behavioral health.  She just got out and has not yet restarted her trilogy ventilator.  She has to get out of storage.  I strongly encouraged her to do this as her respiratory status has been much more stable since she has been compliant.  She stated that she go to work on this

## 2019-10-09 NOTE — Assessment & Plan Note (Signed)
We need to get you back on your trilogy ventilator to wear every night. We will continue oxygen at 2 L/min

## 2019-10-13 ENCOUNTER — Other Ambulatory Visit: Payer: Self-pay

## 2019-10-13 ENCOUNTER — Encounter: Payer: Self-pay | Admitting: Family Medicine

## 2019-10-13 ENCOUNTER — Ambulatory Visit: Payer: Medicaid Other | Admitting: Internal Medicine

## 2019-10-13 ENCOUNTER — Telehealth: Payer: Self-pay

## 2019-10-13 ENCOUNTER — Ambulatory Visit (INDEPENDENT_AMBULATORY_CARE_PROVIDER_SITE_OTHER): Payer: Medicare Other | Admitting: Family Medicine

## 2019-10-13 DIAGNOSIS — M5442 Lumbago with sciatica, left side: Secondary | ICD-10-CM | POA: Diagnosis not present

## 2019-10-13 DIAGNOSIS — M5441 Lumbago with sciatica, right side: Secondary | ICD-10-CM

## 2019-10-13 DIAGNOSIS — G8929 Other chronic pain: Secondary | ICD-10-CM

## 2019-10-13 NOTE — Telephone Encounter (Signed)
Patient called the office about patient scheduled appointment for 9/28. Patient recalled being told information about transportation. Staff contacted pateint on 9/24 about rescheduling appointment.
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
## 2019-10-14 ENCOUNTER — Telehealth: Payer: Self-pay

## 2019-10-14 ENCOUNTER — Ambulatory Visit (INDEPENDENT_AMBULATORY_CARE_PROVIDER_SITE_OTHER): Payer: Medicaid Other | Admitting: Licensed Clinical Social Worker

## 2019-10-14 ENCOUNTER — Ambulatory Visit: Payer: Medicare Other | Admitting: Internal Medicine

## 2019-10-14 ENCOUNTER — Ambulatory Visit: Payer: Medicaid Other | Admitting: Internal Medicine

## 2019-10-14 ENCOUNTER — Institutional Professional Consult (permissible substitution): Payer: Medicaid Other | Admitting: Licensed Clinical Social Worker

## 2019-10-14 DIAGNOSIS — F319 Bipolar disorder, unspecified: Secondary | ICD-10-CM

## 2019-10-14 NOTE — Telephone Encounter (Signed)
Attempted to contact patient to update them on transportation arrival for today.

## 2019-10-17 IMAGING — DX DG CHEST 1V PORT
1 series · 1 of 1 positions shown · non-contrast
Comparison: 10/24/2016

CLINICAL DATA: SOB that began this morning. Pt has recent hx of
cardiac arrest and is undergoing rehab at this time. Pt has
non-radiating cp under the left breast that is tender to palpation.

EXAM:
PORTABLE CHEST 1 VIEW

[chest ap]
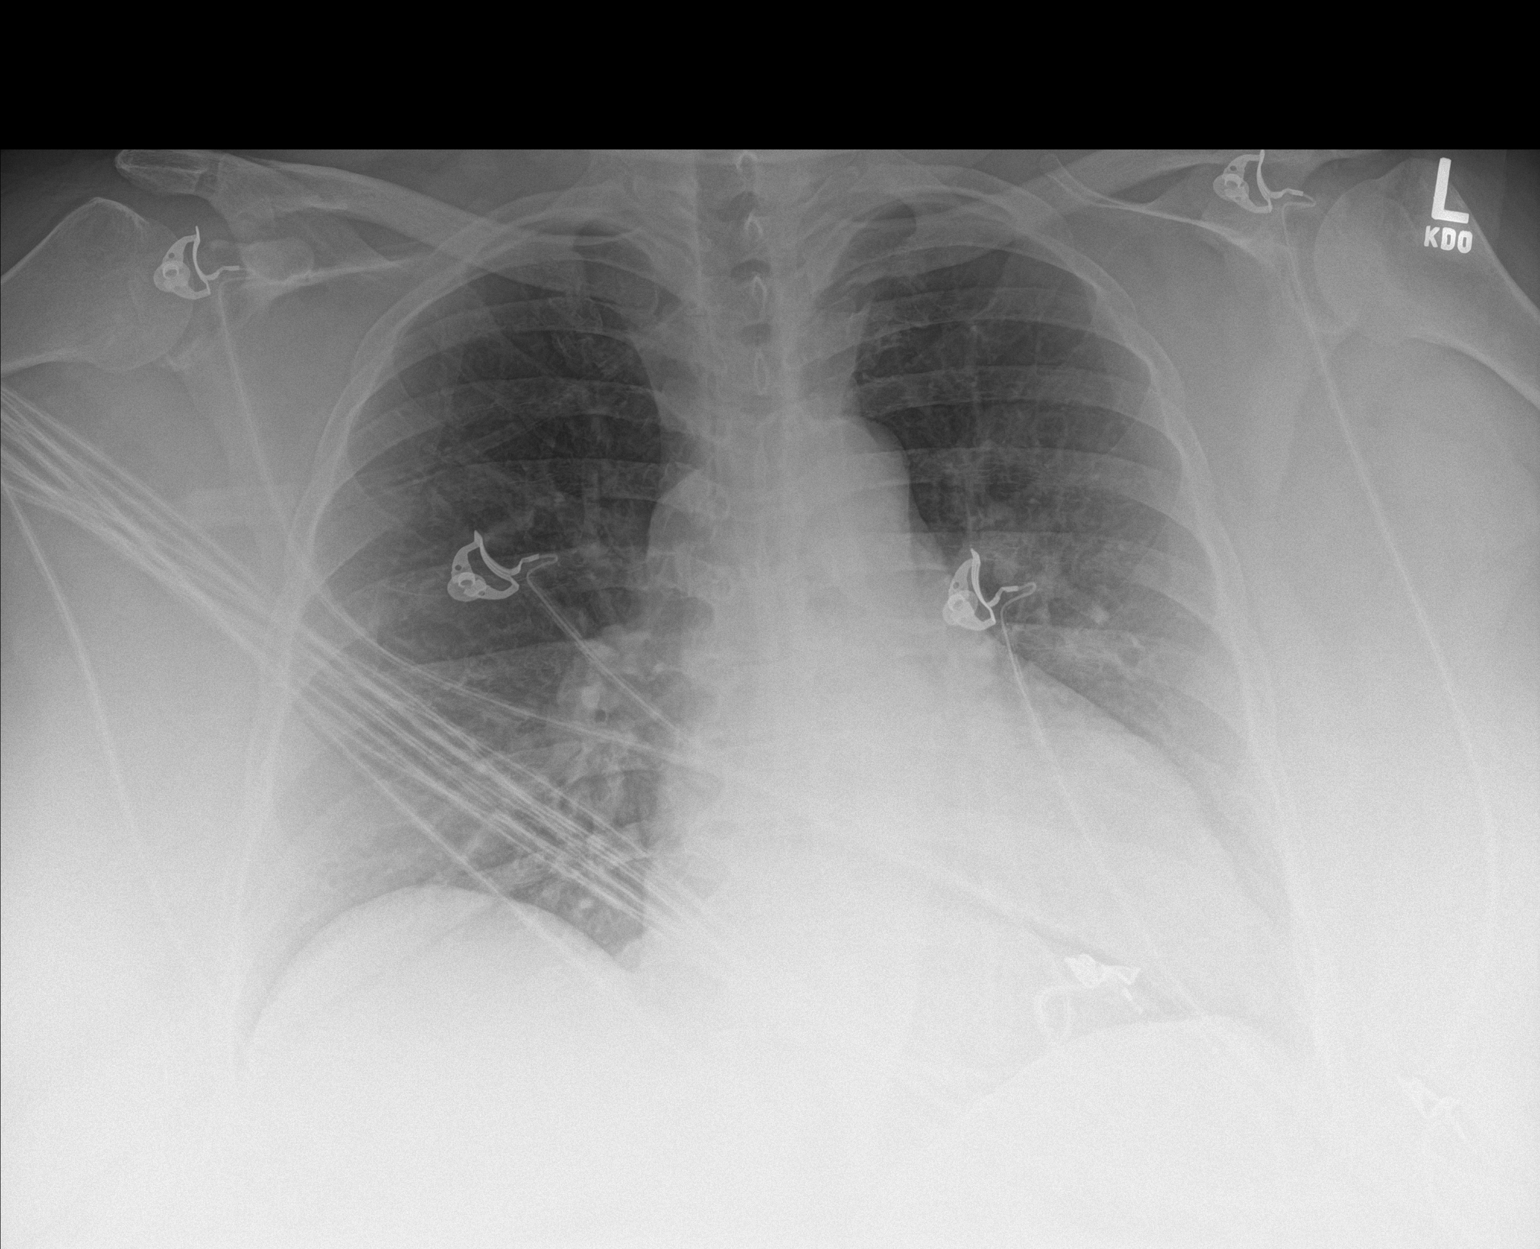

[1 of 1 positions shown; findings below may reference images not displayed]

FINDINGS: The heart size and mediastinal contours are within normal limits.
Both lungs are clear. The visualized skeletal structures are
unremarkable.
IMPRESSION: No active disease.

## 2019-10-22 ENCOUNTER — Telehealth: Payer: Self-pay | Admitting: Licensed Clinical Social Worker

## 2019-10-22 ENCOUNTER — Other Ambulatory Visit: Payer: Self-pay

## 2019-10-22 ENCOUNTER — Telehealth (INDEPENDENT_AMBULATORY_CARE_PROVIDER_SITE_OTHER): Payer: Medicare Other | Admitting: Internal Medicine

## 2019-10-22 DIAGNOSIS — R5381 Other malaise: Secondary | ICD-10-CM | POA: Diagnosis not present

## 2019-10-22 DIAGNOSIS — R197 Diarrhea, unspecified: Secondary | ICD-10-CM

## 2019-10-22 NOTE — Telephone Encounter (Signed)
Follow up call placed to patient. Patient shared that she is experiencing loose stools "from nerves being bad" Shared ongoing stress from strained relationships with mother and adult son. Pt was informed that she has an upcoming appointment scheduled with PCP on this day. Pt is not interested in establishing therapy to assist in management of stress and behavioral health conditions.   Pt would like to discuss obtaining an aid and a service animal. Pt was unable to explain why she is in need of a service animal. LCSW discussed difference between an emotional support animal, that her psychiatrist has written about and a service animal. Pt became upset stating that she will discuss with PCP. No additional concerns noted.
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
## 2019-10-25 DIAGNOSIS — G4733 Obstructive sleep apnea (adult) (pediatric): Secondary | ICD-10-CM | POA: Diagnosis not present

## 2019-10-25 DIAGNOSIS — J45909 Unspecified asthma, uncomplicated: Secondary | ICD-10-CM | POA: Diagnosis not present

## 2019-10-25 DIAGNOSIS — R062 Wheezing: Secondary | ICD-10-CM | POA: Diagnosis not present

## 2019-10-27 ENCOUNTER — Emergency Department (HOSPITAL_COMMUNITY): Payer: Medicare Other

## 2019-10-27 ENCOUNTER — Other Ambulatory Visit: Payer: Self-pay

## 2019-10-27 ENCOUNTER — Inpatient Hospital Stay (HOSPITAL_COMMUNITY): Payer: Medicare Other

## 2019-10-27 ENCOUNTER — Telehealth (HOSPITAL_COMMUNITY): Payer: Medicare Other | Admitting: Psychiatry

## 2019-10-27 ENCOUNTER — Inpatient Hospital Stay (HOSPITAL_COMMUNITY)
Admission: EM | Admit: 2019-10-27 | Discharge: 2019-11-02 | DRG: 682 | Disposition: A | Discharge status: Home Health | Payer: Medicare Other | Attending: Internal Medicine | Admitting: Internal Medicine

## 2019-10-27 ENCOUNTER — Encounter (HOSPITAL_COMMUNITY): Payer: Self-pay

## 2019-10-27 DIAGNOSIS — E872 Acidosis: Secondary | ICD-10-CM | POA: Diagnosis not present

## 2019-10-27 DIAGNOSIS — R001 Bradycardia, unspecified: Secondary | ICD-10-CM | POA: Diagnosis present

## 2019-10-27 DIAGNOSIS — R627 Adult failure to thrive: Secondary | ICD-10-CM | POA: Diagnosis present

## 2019-10-27 DIAGNOSIS — R410 Disorientation, unspecified: Secondary | ICD-10-CM | POA: Diagnosis not present

## 2019-10-27 DIAGNOSIS — E1140 Type 2 diabetes mellitus with diabetic neuropathy, unspecified: Secondary | ICD-10-CM | POA: Diagnosis present

## 2019-10-27 DIAGNOSIS — Z888 Allergy status to other drugs, medicaments and biological substances status: Secondary | ICD-10-CM

## 2019-10-27 DIAGNOSIS — Z8674 Personal history of sudden cardiac arrest: Secondary | ICD-10-CM

## 2019-10-27 DIAGNOSIS — G9341 Metabolic encephalopathy: Secondary | ICD-10-CM | POA: Diagnosis present

## 2019-10-27 DIAGNOSIS — Z87891 Personal history of nicotine dependence: Secondary | ICD-10-CM

## 2019-10-27 DIAGNOSIS — E869 Volume depletion, unspecified: Secondary | ICD-10-CM | POA: Diagnosis not present

## 2019-10-27 DIAGNOSIS — N1832 Chronic kidney disease, stage 3b: Secondary | ICD-10-CM | POA: Diagnosis not present

## 2019-10-27 DIAGNOSIS — L8990 Pressure ulcer of unspecified site, unspecified stage: Secondary | ICD-10-CM | POA: Insufficient documentation

## 2019-10-27 DIAGNOSIS — I130 Hypertensive heart and chronic kidney disease with heart failure and stage 1 through stage 4 chronic kidney disease, or unspecified chronic kidney disease: Secondary | ICD-10-CM | POA: Diagnosis present

## 2019-10-27 DIAGNOSIS — Z20822 Contact with and (suspected) exposure to covid-19: Secondary | ICD-10-CM | POA: Diagnosis present

## 2019-10-27 DIAGNOSIS — J439 Emphysema, unspecified: Secondary | ICD-10-CM | POA: Diagnosis present

## 2019-10-27 DIAGNOSIS — F250 Schizoaffective disorder, bipolar type: Secondary | ICD-10-CM | POA: Diagnosis not present

## 2019-10-27 DIAGNOSIS — L89312 Pressure ulcer of right buttock, stage 2: Secondary | ICD-10-CM | POA: Diagnosis not present

## 2019-10-27 DIAGNOSIS — M109 Gout, unspecified: Secondary | ICD-10-CM | POA: Diagnosis present

## 2019-10-27 DIAGNOSIS — Z801 Family history of malignant neoplasm of trachea, bronchus and lung: Secondary | ICD-10-CM

## 2019-10-27 DIAGNOSIS — Z79899 Other long term (current) drug therapy: Secondary | ICD-10-CM

## 2019-10-27 DIAGNOSIS — Z8249 Family history of ischemic heart disease and other diseases of the circulatory system: Secondary | ICD-10-CM

## 2019-10-27 DIAGNOSIS — I5032 Chronic diastolic (congestive) heart failure: Secondary | ICD-10-CM | POA: Diagnosis not present

## 2019-10-27 DIAGNOSIS — M549 Dorsalgia, unspecified: Secondary | ICD-10-CM | POA: Diagnosis present

## 2019-10-27 DIAGNOSIS — Z885 Allergy status to narcotic agent status: Secondary | ICD-10-CM

## 2019-10-27 DIAGNOSIS — Z882 Allergy status to sulfonamides status: Secondary | ICD-10-CM

## 2019-10-27 DIAGNOSIS — E870 Hyperosmolality and hypernatremia: Secondary | ICD-10-CM | POA: Diagnosis not present

## 2019-10-27 DIAGNOSIS — F4312 Post-traumatic stress disorder, chronic: Secondary | ICD-10-CM | POA: Diagnosis present

## 2019-10-27 DIAGNOSIS — E875 Hyperkalemia: Secondary | ICD-10-CM | POA: Diagnosis present

## 2019-10-27 DIAGNOSIS — F419 Anxiety disorder, unspecified: Secondary | ICD-10-CM | POA: Diagnosis present

## 2019-10-27 DIAGNOSIS — G9349 Other encephalopathy: Secondary | ICD-10-CM | POA: Diagnosis present

## 2019-10-27 DIAGNOSIS — K8020 Calculus of gallbladder without cholecystitis without obstruction: Secondary | ICD-10-CM | POA: Diagnosis not present

## 2019-10-27 DIAGNOSIS — I10 Essential (primary) hypertension: Secondary | ICD-10-CM | POA: Diagnosis not present

## 2019-10-27 DIAGNOSIS — J9611 Chronic respiratory failure with hypoxia: Secondary | ICD-10-CM | POA: Diagnosis present

## 2019-10-27 DIAGNOSIS — G629 Polyneuropathy, unspecified: Secondary | ICD-10-CM | POA: Diagnosis not present

## 2019-10-27 DIAGNOSIS — D649 Anemia, unspecified: Secondary | ICD-10-CM

## 2019-10-27 DIAGNOSIS — N171 Acute kidney failure with acute cortical necrosis: Secondary | ICD-10-CM

## 2019-10-27 DIAGNOSIS — N179 Acute kidney failure, unspecified: Principal | ICD-10-CM | POA: Diagnosis present

## 2019-10-27 DIAGNOSIS — Z818 Family history of other mental and behavioral disorders: Secondary | ICD-10-CM

## 2019-10-27 DIAGNOSIS — R9431 Abnormal electrocardiogram [ECG] [EKG]: Secondary | ICD-10-CM | POA: Diagnosis present

## 2019-10-27 DIAGNOSIS — I252 Old myocardial infarction: Secondary | ICD-10-CM

## 2019-10-27 DIAGNOSIS — E785 Hyperlipidemia, unspecified: Secondary | ICD-10-CM | POA: Diagnosis present

## 2019-10-27 DIAGNOSIS — Z6828 Body mass index (BMI) 28.0-28.9, adult: Secondary | ICD-10-CM | POA: Diagnosis not present

## 2019-10-27 DIAGNOSIS — J9612 Chronic respiratory failure with hypercapnia: Secondary | ICD-10-CM | POA: Diagnosis present

## 2019-10-27 DIAGNOSIS — E1122 Type 2 diabetes mellitus with diabetic chronic kidney disease: Secondary | ICD-10-CM | POA: Diagnosis not present

## 2019-10-27 DIAGNOSIS — A0472 Enterocolitis due to Clostridium difficile, not specified as recurrent: Secondary | ICD-10-CM | POA: Diagnosis present

## 2019-10-27 DIAGNOSIS — N183 Chronic kidney disease, stage 3 unspecified: Secondary | ICD-10-CM | POA: Diagnosis not present

## 2019-10-27 DIAGNOSIS — G894 Chronic pain syndrome: Secondary | ICD-10-CM | POA: Diagnosis present

## 2019-10-27 DIAGNOSIS — G4733 Obstructive sleep apnea (adult) (pediatric): Secondary | ICD-10-CM | POA: Diagnosis present

## 2019-10-27 DIAGNOSIS — R7303 Prediabetes: Secondary | ICD-10-CM | POA: Diagnosis present

## 2019-10-27 DIAGNOSIS — F1421 Cocaine dependence, in remission: Secondary | ICD-10-CM | POA: Diagnosis present

## 2019-10-27 DIAGNOSIS — R109 Unspecified abdominal pain: Secondary | ICD-10-CM

## 2019-10-27 DIAGNOSIS — E876 Hypokalemia: Secondary | ICD-10-CM | POA: Diagnosis present

## 2019-10-27 DIAGNOSIS — R197 Diarrhea, unspecified: Secondary | ICD-10-CM | POA: Diagnosis not present

## 2019-10-27 DIAGNOSIS — I6523 Occlusion and stenosis of bilateral carotid arteries: Secondary | ICD-10-CM | POA: Diagnosis present

## 2019-10-27 DIAGNOSIS — R748 Abnormal levels of other serum enzymes: Secondary | ICD-10-CM | POA: Diagnosis present

## 2019-10-27 DIAGNOSIS — Z90710 Acquired absence of both cervix and uterus: Secondary | ICD-10-CM

## 2019-10-27 DIAGNOSIS — R531 Weakness: Secondary | ICD-10-CM | POA: Diagnosis not present

## 2019-10-27 DIAGNOSIS — Z91048 Other nonmedicinal substance allergy status: Secondary | ICD-10-CM

## 2019-10-27 DIAGNOSIS — N83202 Unspecified ovarian cyst, left side: Secondary | ICD-10-CM | POA: Diagnosis present

## 2019-10-27 DIAGNOSIS — Z8673 Personal history of transient ischemic attack (TIA), and cerebral infarction without residual deficits: Secondary | ICD-10-CM

## 2019-10-27 DIAGNOSIS — L98429 Non-pressure chronic ulcer of back with unspecified severity: Secondary | ICD-10-CM | POA: Insufficient documentation

## 2019-10-27 LAB — CBC
HCT: 38.5 % (ref 36.0–46.0)
Hemoglobin: 12.1 g/dL (ref 12.0–15.0)
MCH: 28 pg (ref 26.0–34.0)
MCHC: 31.4 g/dL (ref 30.0–36.0)
MCV: 89.1 fL (ref 80.0–100.0)
Platelets: 304 10*3/uL (ref 150–400)
RBC: 4.32 MIL/uL (ref 3.87–5.11)
RDW: 16.1 % — ABNORMAL HIGH (ref 11.5–15.5)
WBC: 7.4 10*3/uL (ref 4.0–10.5)
nRBC: 0.4 % — ABNORMAL HIGH (ref 0.0–0.2)

## 2019-10-27 LAB — COMPREHENSIVE METABOLIC PANEL
ALT: 31 U/L (ref 0–44)
AST: 40 U/L (ref 15–41)
Albumin: 3.1 g/dL — ABNORMAL LOW (ref 3.5–5.0)
Alkaline Phosphatase: 175 U/L — ABNORMAL HIGH (ref 38–126)
Anion gap: 14 (ref 5–15)
BUN: 110 mg/dL — ABNORMAL HIGH (ref 6–20)
CO2: 14 mmol/L — ABNORMAL LOW (ref 22–32)
Calcium: 9.2 mg/dL (ref 8.9–10.3)
Chloride: 116 mmol/L — ABNORMAL HIGH (ref 98–111)
Creatinine, Ser: 3.95 mg/dL — ABNORMAL HIGH (ref 0.44–1.00)
GFR, Estimated: 12 mL/min — ABNORMAL LOW (ref >60)
Glucose, Bld: 89 mg/dL (ref 70–99)
Potassium: 6.8 mmol/L (ref 3.5–5.1)
Sodium: 144 mmol/L (ref 135–145)
Total Bilirubin: 1.4 mg/dL — ABNORMAL HIGH (ref 0.3–1.2)
Total Protein: 6.8 g/dL (ref 6.5–8.1)

## 2019-10-27 LAB — BLOOD GAS, ARTERIAL
Acid-base deficit: 12.1 mmol/L — ABNORMAL HIGH (ref 0.0–2.0)
Bicarbonate: 13.3 mmol/L — ABNORMAL LOW (ref 20.0–28.0)
O2 Saturation: 96.3 %
Patient temperature: 97.6
pCO2 arterial: 29.8 mmHg — ABNORMAL LOW (ref 32.0–48.0)
pH, Arterial: 7.273 — ABNORMAL LOW (ref 7.350–7.450)
pO2, Arterial: 98.6 mmHg (ref 83.0–108.0)

## 2019-10-27 LAB — I-STAT CREATININE, ED: Creatinine, Ser: 4.1 mg/dL — ABNORMAL HIGH (ref 0.44–1.00)

## 2019-10-27 LAB — RESPIRATORY PANEL BY RT PCR (FLU A&B, COVID)
Influenza A by PCR: NEGATIVE
Influenza B by PCR: NEGATIVE
SARS Coronavirus 2 by RT PCR: NEGATIVE

## 2019-10-27 LAB — CBC WITH DIFFERENTIAL/PLATELET
Abs Immature Granulocytes: 0.83 10*3/uL — ABNORMAL HIGH (ref 0.00–0.07)
Basophils Absolute: 0 10*3/uL (ref 0.0–0.1)
Basophils Relative: 0 %
Eosinophils Absolute: 0 10*3/uL (ref 0.0–0.5)
Eosinophils Relative: 0 %
HCT: 43.1 % (ref 36.0–46.0)
Hemoglobin: 13.3 g/dL (ref 12.0–15.0)
Immature Granulocytes: 10 %
Lymphocytes Relative: 7 %
Lymphs Abs: 0.6 10*3/uL — ABNORMAL LOW (ref 0.7–4.0)
MCH: 28.2 pg (ref 26.0–34.0)
MCHC: 30.9 g/dL (ref 30.0–36.0)
MCV: 91.3 fL (ref 80.0–100.0)
Monocytes Absolute: 1.1 10*3/uL — ABNORMAL HIGH (ref 0.1–1.0)
Monocytes Relative: 13 %
Neutro Abs: 5.9 10*3/uL (ref 1.7–7.7)
Neutrophils Relative %: 70 %
Platelets: 310 10*3/uL (ref 150–400)
RBC: 4.72 MIL/uL (ref 3.87–5.11)
RDW: 16.5 % — ABNORMAL HIGH (ref 11.5–15.5)
WBC: 8.5 10*3/uL (ref 4.0–10.5)
nRBC: 0.4 % — ABNORMAL HIGH (ref 0.0–0.2)

## 2019-10-27 LAB — RENAL FUNCTION PANEL
Albumin: 2.9 g/dL — ABNORMAL LOW (ref 3.5–5.0)
Anion gap: 16 — ABNORMAL HIGH (ref 5–15)
BUN: 99 mg/dL — ABNORMAL HIGH (ref 6–20)
CO2: 16 mmol/L — ABNORMAL LOW (ref 22–32)
Calcium: 9 mg/dL (ref 8.9–10.3)
Chloride: 115 mmol/L — ABNORMAL HIGH (ref 98–111)
Creatinine, Ser: 3.27 mg/dL — ABNORMAL HIGH (ref 0.44–1.00)
GFR, Estimated: 15 mL/min — ABNORMAL LOW (ref >60)
Glucose, Bld: 144 mg/dL — ABNORMAL HIGH (ref 70–99)
Phosphorus: 4.4 mg/dL (ref 2.5–4.6)
Potassium: 4.2 mmol/L (ref 3.5–5.1)
Sodium: 147 mmol/L — ABNORMAL HIGH (ref 135–145)

## 2019-10-27 LAB — RAPID URINE DRUG SCREEN, HOSP PERFORMED
Amphetamines: NOT DETECTED
Barbiturates: NOT DETECTED
Benzodiazepines: POSITIVE — AB
Cocaine: NOT DETECTED
Opiates: NOT DETECTED
Tetrahydrocannabinol: NOT DETECTED

## 2019-10-27 LAB — URINALYSIS, ROUTINE W REFLEX MICROSCOPIC
Bilirubin Urine: NEGATIVE
Glucose, UA: NEGATIVE mg/dL
Ketones, ur: 5 mg/dL — AB
Leukocytes,Ua: NEGATIVE
Nitrite: NEGATIVE
Protein, ur: NEGATIVE mg/dL
Specific Gravity, Urine: 1.014 (ref 1.005–1.030)
pH: 5 (ref 5.0–8.0)

## 2019-10-27 LAB — ETHANOL: Alcohol, Ethyl (B): <10 mg/dL (ref <10)

## 2019-10-27 LAB — I-STAT CHEM 8, ED
BUN: 92 mg/dL — ABNORMAL HIGH (ref 6–20)
Calcium, Ion: 1.25 mmol/L (ref 1.15–1.40)
Chloride: 120 mmol/L — ABNORMAL HIGH (ref 98–111)
Creatinine, Ser: 3.9 mg/dL — ABNORMAL HIGH (ref 0.44–1.00)
Glucose, Bld: 86 mg/dL (ref 70–99)
HCT: 42 % (ref 36.0–46.0)
Hemoglobin: 14.3 g/dL (ref 12.0–15.0)
Potassium: 4.8 mmol/L (ref 3.5–5.1)
Sodium: 145 mmol/L (ref 135–145)
TCO2: 17 mmol/L — ABNORMAL LOW (ref 22–32)

## 2019-10-27 LAB — SALICYLATE LEVEL: Salicylate Lvl: <7 mg/dL — ABNORMAL LOW (ref 7.0–30.0)

## 2019-10-27 LAB — LACTIC ACID, PLASMA: Lactic Acid, Venous: 1.2 mmol/L (ref 0.5–1.9)

## 2019-10-27 LAB — TSH: TSH: 1.233 u[IU]/mL (ref 0.350–4.500)

## 2019-10-27 LAB — CBG MONITORING, ED
Glucose-Capillary: 88 mg/dL (ref 70–99)
Glucose-Capillary: 143 mg/dL — ABNORMAL HIGH (ref 70–99)
Glucose-Capillary: 125 mg/dL — ABNORMAL HIGH (ref 70–99)

## 2019-10-27 LAB — CK: Total CK: 547 U/L — ABNORMAL HIGH (ref 38–234)

## 2019-10-27 LAB — SODIUM, URINE, RANDOM: Sodium, Ur: 21 mmol/L

## 2019-10-27 LAB — GAMMA GT: GGT: 79 U/L — ABNORMAL HIGH (ref 7–50)

## 2019-10-27 LAB — PROCALCITONIN: Procalcitonin: 6.29 ng/mL

## 2019-10-27 LAB — ACETAMINOPHEN LEVEL: Acetaminophen (Tylenol), Serum: <10 ug/mL — ABNORMAL LOW (ref 10–30)

## 2019-10-27 LAB — AMMONIA: Ammonia: 16 umol/L (ref 9–35)

## 2019-10-27 LAB — HIV ANTIBODY (ROUTINE TESTING W REFLEX): HIV Screen 4th Generation wRfx: NONREACTIVE

## 2019-10-27 MED ORDER — AMLODIPINE BESYLATE 10 MG PO TABS
10.0000 mg | ORAL_TABLET | Freq: Every day | ORAL | Status: DC
  Administered 2019-10-27 – 2019-10-31 (×5): 10 mg via ORAL
  Filled 2019-10-27 (×3): qty 1
  Filled 2019-10-27: qty 2
  Filled 2019-10-27: qty 1

## 2019-10-27 MED ORDER — OXYBUTYNIN CHLORIDE ER 5 MG PO TB24
10.0000 mg | ORAL_TABLET | Freq: Every day | ORAL | Status: DC
  Administered 2019-10-27 – 2019-11-01 (×6): 10 mg via ORAL
  Filled 2019-10-27 (×6): qty 2
  Filled 2019-10-27: qty 1

## 2019-10-27 MED ORDER — ONDANSETRON HCL 4 MG PO TABS: 4.0000 mg | ORAL_TABLET | Freq: Four times a day (QID) | ORAL | Status: DC | PRN

## 2019-10-27 MED ORDER — CHLORHEXIDINE GLUCONATE CLOTH 2 % EX PADS
6.0000 | MEDICATED_PAD | Freq: Every day | CUTANEOUS | Status: DC
  Administered 2019-10-28 – 2019-11-02 (×6): 6 via TOPICAL

## 2019-10-27 MED ORDER — MONTELUKAST SODIUM 10 MG PO TABS
10.0000 mg | ORAL_TABLET | Freq: Every day | ORAL | Status: DC
  Administered 2019-10-27 – 2019-11-01 (×6): 10 mg via ORAL
  Filled 2019-10-27 (×7): qty 1

## 2019-10-27 MED ORDER — SODIUM BICARBONATE 8.4 % IV SOLN
INTRAVENOUS | Status: DC
  Filled 2019-10-27: qty 150

## 2019-10-27 MED ORDER — SODIUM BICARBONATE 8.4 % IV SOLN
INTRAVENOUS | Status: DC
  Filled 2019-10-27: qty 150
  Filled 2019-10-27: qty 850
  Filled 2019-10-27 (×2): qty 150
  Filled 2019-10-27: qty 850

## 2019-10-27 MED ORDER — HEPARIN SODIUM (PORCINE) 5000 UNIT/ML IJ SOLN
5000.0000 [IU] | Freq: Three times a day (TID) | INTRAMUSCULAR | Status: DC
  Administered 2019-10-27 – 2019-11-02 (×20): 5000 [IU] via SUBCUTANEOUS
  Filled 2019-10-27 (×20): qty 1

## 2019-10-27 MED ORDER — LACTATED RINGERS IV BOLUS: 1000.0000 mL | Freq: Once | INTRAVENOUS | Status: DC

## 2019-10-27 MED ORDER — SODIUM BICARBONATE 8.4 % IV SOLN: INTRAVENOUS | Status: DC

## 2019-10-27 MED ORDER — ONDANSETRON HCL 4 MG/2ML IJ SOLN
4.0000 mg | Freq: Four times a day (QID) | INTRAMUSCULAR | Status: DC | PRN
  Administered 2019-10-29: 4 mg via INTRAVENOUS
  Filled 2019-10-27: qty 2

## 2019-10-27 MED ORDER — IPRATROPIUM-ALBUTEROL 0.5-2.5 (3) MG/3ML IN SOLN: 3.0000 mL | Freq: Four times a day (QID) | RESPIRATORY_TRACT | Status: DC | PRN

## 2019-10-27 MED ORDER — SODIUM BICARBONATE 8.4 % IV SOLN
INTRAVENOUS | Status: DC
  Filled 2019-10-27 (×2): qty 1000

## 2019-10-27 MED ORDER — PRAZOSIN HCL 5 MG PO CAPS
5.0000 mg | ORAL_CAPSULE | Freq: Two times a day (BID) | ORAL | Status: DC
  Administered 2019-10-27 – 2019-11-02 (×12): 5 mg via ORAL
  Filled 2019-10-27: qty 1
  Filled 2019-10-27: qty 5
  Filled 2019-10-27 (×14): qty 1

## 2019-10-27 MED ORDER — ATORVASTATIN CALCIUM 40 MG PO TABS
80.0000 mg | ORAL_TABLET | Freq: Every day | ORAL | Status: DC
  Administered 2019-10-27: 80 mg via ORAL
  Filled 2019-10-27: qty 2

## 2019-10-27 NOTE — ED Notes (Signed)
Lunch tray delivered.

## 2019-10-27 NOTE — H&P (Addendum)
History and Physical    Tiffany [name] JQB:[id number] DOB: 1960/09/17 DOA: 10/27/2019  PCP: [name], DO  Patient coming from: Home  Chief Complaint: AMS  HPI: Tiffany [name] is a 59 y.o. female with medical history significant of CKD3b, DM, HTN, COPD. Presenting with AMS/lethargy. Hx is from chart review as patient is unable to participate and there is no family contact information at time of interview.   Pt was found to be increasingly confused and weak by family. They became concerned and called EMS for transport to [facility]. She had a recent visit to her PCP (10/22/19) with concerns about diarrhea and "not feeling well." At the time, supportive care was recommended. There has been no report of diarrhea as a concern for this visit.   ED Course: Lab work revealed AKI and acidemia. CTH was negative for acute process. TRH was called for admission.   Review of Systems:  Unable to obtain d/t mentation.   PMHx Past Medical History:  Diagnosis Date  . Agitation 11/22/2017  . Anoxic brain injury ([place]) 09/08/2016   C. Arrest due to respiratory failure and COPD exacerbation  . Anxiety   . Arthritis    "all over" (04/10/2016)  . Asthma 10/18/2010  . Binge eating disorder   . Cardiac arrest ([place]) 09/08/2016   PEA  . Carotid artery stenosis    1-39% bilateral by dopplers 11/2016  . Chronic diastolic (congestive) heart failure ([place])   . Chronic kidney disease    "I see a kidney dr." (04/10/2016)  . Chronic pain syndrome 06/18/2012  . Chronic post-traumatic stress disorder (PTSD) 05/27/2018  . Chronic respiratory failure with hypoxia and hypercapnia (HCC) 06/22/2015   TRILOGY Vent >AVAPA-ES., Vt target 200-400, Max P 30 , PS max 20 , PS min 6-10 , E Max 6, E Min 4, Rate Auto AVAPS Rate 2 (titrate for pt comfort) , bleed O2 at 5l/m continuous flow .   [name] CKD (chronic kidney disease) stage 3, GFR 30-59 ml/min (HCC) 12/15/2016  . Closed displaced fracture of fifth metacarpal bone  03/21/2018  . Cocaine use disorder, severe, in sustained remission ([place]) 12/17/2015  . Complication of anesthesia    decreased bp, decreased heart rate  . COPD (chronic obstructive pulmonary disease) ([place]) 07/08/2014  . Depression   . Diabetic neuropathy ([place]) 04/24/2011  . Difficulty with speech 01/24/2018  . Disorder of nervous system   . Drug abuse ([place]) 11/21/2017  . Dyslipidemia 04/24/2011  . Elevated troponin 04/28/2012  . Emphysema   . Encephalopathy 11/21/2017  . Essential hypertension 03/22/2016  . Fibula fracture 07/10/2016  . Frequent falls 10/11/2017  . GERD (gastroesophageal reflux disease)   . Gout 04/11/2017  . Heart attack ([place]) 1980s  . History of blood transfusion 1994   "couldn't stop bleeding from my period"  . History of drug abuse in remission ([place]) 11/28/2015   Quit in 2017  . Hyperlipidemia LDL goal <70   . Incontinence   . Manic depression ([place])   . Morbid obesity ([place]) 10/18/2010  . Obstructive sleep apnea 10/18/2010  . On home oxygen therapy    "6L; 24/7" (04/10/2016)  . OSA on CPAP    "wear mask sometimes" (04/10/2016)  . Paranoid ([place])    "sometimes; I'm on RX for it" (04/10/2016)  . Prolonged Q-T interval on ECG   . Rectal bleeding 12/31/2015  . Right carotid bruit 11/09/2016  . Schizoaffective disorder, bipolar type ([place]) 04/05/2018  . Seasonal allergies   . Seborrheic  keratoses 12/31/2013  . Seizures ([place])    "don't know what kind; last one was ~ 1 yr ago" (04/10/2016)  . Stroke [facility]) 1980s   denies residual on 04/10/2016  . Thrush 09/19/2013  . Type 2 diabetes mellitus ([place]) 10/18/2010    PSHx Past Surgical History:  Procedure Laterality Date  . CESAREAN SECTION  1997  . HERNIA REPAIR    . IR CHOLANGIOGRAM EXISTING TUBE  07/20/2016  . IR PERC CHOLECYSTOSTOMY  05/10/2016  . IR RADIOLOGIST EVAL & MGMT  06/08/2016  . IR RADIOLOGIST EVAL & MGMT  06/29/2016  . IR SINUS/FIST TUBE CHK-NON GI  07/12/2016  . RIGHT/LEFT HEART CATH AND CORONARY ANGIOGRAPHY N/A 06/19/2017    Procedure: RIGHT/LEFT HEART CATH AND CORONARY ANGIOGRAPHY;  Surgeon: Jolaine Artist, MD;  Location: [place] [facility];  Service: Cardiovascular;  Laterality: N/A;  . TIBIA IM NAIL INSERTION Right 07/12/2016   Procedure: INTRAMEDULLARY (IM) NAIL RIGHT TIBIA;  Surgeon: Leandrew Koyanagi, MD;  Location: [facility];  Service: Orthopedics;  Laterality: Right;  . UMBILICAL HERNIA REPAIR  ~ 1963   "that's why I don't have a belly button"  . VAGINAL HYSTERECTOMY      SocHx  reports that she quit smoking about 3 years ago. Her smoking use included cigarettes. She started smoking about 42 years ago. She has a 57.00 pack-year smoking history. She has never used smokeless tobacco. She reports that she does not drink alcohol and does not use drugs.  Allergies  Allergen Reactions  . Hydrocodone Shortness Of Breath  . Hydrocodone-Acetaminophen Shortness Of Breath  . Hydroxyzine Anaphylaxis and Shortness Of Breath  . Latuda [Lurasidone Hcl] Anaphylaxis  . Lurasidone Anaphylaxis  . Magnesium-Containing Compounds Anaphylaxis  . Prednisone Anaphylaxis, Swelling and Other (See Comments)    Tongue swelling  . Tramadol Anaphylaxis and Swelling  . Codeine Nausea And Vomiting  . Other Rash  . Sulfa Antibiotics Itching  . Tape Rash    FamHx Family History  Problem Relation Age of Onset  . Cancer Father        prostate  . Cancer Mother        lung  . Depression Mother   . Depression Sister   . Anxiety disorder Sister   . Schizophrenia Sister   . Bipolar disorder Sister   . Depression Sister   . Depression Brother   . Heart failure Other        cousin    Prior to Admission medications   Medication Sig Start Date End Date Taking? Authorizing Provider  albuterol (PROVENTIL) (2.5 MG/3ML) 0.083% nebulizer solution Take 3 mLs (2.5 mg total) by nebulization every 6 (six) hours as needed for wheezing or shortness of breath. 04/29/19   Magdalen Spatz, NP  albuterol (VENTOLIN HFA) 108 (90 Base) MCG/ACT  inhaler Inhale 2 puffs into the lungs every 6 (six) hours as needed for wheezing or shortness of breath. 10/09/19   Collene Gobble, MD  amLODipine (NORVASC) 10 MG tablet Take 1 tablet (10 mg total) by mouth daily. 09/08/19   [name], DO  Asenapine Maleate 10 MG SUBL Place 1 tablet (10 mg total) under the tongue in the morning, at noon, and at bedtime. 10/06/19 01/04/20  Pucilowski, Marchia Bond, MD  atorvastatin (LIPITOR) 80 MG tablet Take 1 tablet (80 mg total) by mouth daily. 12/10/18   Maximiano Coss, NP  colchicine 0.6 MG tablet Take 1 tablet (0.6 mg total) by mouth 2 (two) times daily. Patient taking  differently: Take 1.2 mg by mouth 2 (two) times daily.  09/08/19   [name], DO  diazepam (VALIUM) 10 MG tablet Take 1 tablet (10 mg total) by mouth 3 (three) times daily. 10/06/19 01/04/20  Pucilowski, Marchia Bond, MD  diclofenac Sodium (VOLTAREN) 1 % GEL Apply 2 g topically 4 (four) times daily. 09/10/19   [name], DO  escitalopram (LEXAPRO) 20 MG tablet Take 2 tablets (40 mg total) by mouth daily. 08/29/19 11/27/19  Pucilowski, Marchia Bond, MD  febuxostat (ULORIC) 40 MG tablet Take 1 tablet (40 mg total) by mouth daily. 09/11/19   [name], DO  ferrous sulfate 325 (65 FE) MG tablet Take 1 tablet (325 mg total) by mouth 2 (two) times daily with a meal. 09/08/19   [name], DO  Fluticasone-Umeclidin-Vilant (TRELEGY ELLIPTA) 100-62.5-25 MCG/INH AEPB Inhale 1 puff into the lungs daily. 10/09/19   Collene Gobble, MD  gabapentin (NEURONTIN) 600 MG tablet Take 1 tablet (600 mg total) by mouth 2 (two) times daily. 08/04/19   Pucilowski, Marchia Bond, MD  lisdexamfetamine (VYVANSE) 70 MG capsule Take 1 capsule (70 mg total) by mouth daily before breakfast. 10/06/19 11/05/19  Pucilowski, Marchia Bond, MD  metolazone (ZAROXOLYN) 2.5 MG tablet Take 1 tablet (2.5 mg total) by mouth in the morning, at noon, and at bedtime. 09/08/19   [name], DO  montelukast (SINGULAIR) 10 MG tablet Take 10 mg by mouth at bedtime. 03/10/19   [provider]  Multiple Vitamin (MULTIVITAMIN WITH MINERALS) TABS tablet Take 1 tablet by mouth daily.    [provider]  omeprazole (PRILOSEC) 40 MG capsule Take 1 capsule (40 mg total) by mouth 2 (two) times daily. 09/08/19   [name], DO  oxybutynin (DITROPAN-XL) 10 MG 24 hr tablet Take 1 tablet (10 mg total) by mouth at bedtime. 09/08/19   [name], DO  potassium chloride SA (KLOR-CON) 20 MEQ tablet Take 2 tablets (40 mEq total) by mouth 2 (two) times daily. 05/26/19   [name], DO  prazosin (MINIPRESS) 5 MG capsule Take 1 capsule (5 mg total) by mouth 2 (two) times daily. 10/06/19 01/04/20  Pucilowski, Marchia Bond, MD  traZODone (DESYREL) 100 MG tablet Take 1 tablet (100 mg total) by mouth at bedtime as needed for sleep. 10/06/19 01/04/20  Pucilowski, Marchia Bond, MD  budesonide-formoterol (SYMBICORT) 80-4.5 MCG/ACT inhaler Inhale 2 puffs into the lungs 2 (two) times daily. Patient not taking: Reported on 09/29/2019 11/15/18 09/29/19  Maximiano Coss, NP    Physical Exam: Vitals:   10/27/19 0312 10/27/19 0414 10/27/19 0415 10/27/19 0635  BP: (!) 163/93  (!) 167/98 (!) 151/85  Pulse: 73  66 72  Resp: 15  13 20   Temp: 97.6 F (36.4 C) 97.8 F (36.6 C)    TempSrc: Oral Rectal    SpO2: 98%  99% 99%    General: 59 y.o. female resting in bed in NAD Eyes: PERRL, normal sclera ENMT: Nares patent w/o discharge, orophaynx clear, dentition normal, ears w/o discharge/lesions/ulcers Neck: Supple, trachea midline Cardiovascular: RRR, +S1, S2, no m/g/r, equal pulses throughout Respiratory: decreased at bases, no w/r/r, normal WOB GI: BS+, NDNT, no masses noted, no organomegaly noted MSK: No e/c/c Skin: No rashes, bruises, ulcerations noted Neuro: responsive to noxious stimuli but not participating in exam; no focality noted  Labs  on Admission: I have personally reviewed following labs and imaging studies  CBC: Recent Labs  Lab 10/27/19 0358 10/27/19 4098  WBC 8.5  --   NEUTROABS 5.9  --   HGB 13.3 14.3  HCT 43.1 42.0  MCV 91.3  --   PLT 310  --    Basic Metabolic Panel: Recent Labs  Lab 10/27/19 0358 10/27/19 0523  NA 144 145  K 6.8* 4.8  CL 116* 120*  CO2 14*  --   GLUCOSE 89 86  BUN 110* 92*  CREATININE 3.95* 3.90*  CALCIUM 9.2  --    GFR: CrCl cannot be calculated (Unknown ideal weight.). Liver Function Tests: Recent Labs  Lab 10/27/19 0358  AST 40  ALT 31  ALKPHOS 175*  BILITOT 1.4*  PROT 6.8  ALBUMIN 3.1*   No results for input(s): LIPASE, AMYLASE in the last 168 hours. Recent Labs  Lab 10/27/19 0358  AMMONIA 16   Coagulation Profile: No results for input(s): INR, PROTIME in the last 168 hours. Cardiac Enzymes: No results for input(s): CKTOTAL, CKMB, CKMBINDEX, TROPONINI in the last 168 hours. BNP (last 3 results) No results for input(s): PROBNP in the last 8760 hours. HbA1C: No results for input(s): HGBA1C in the last 72 hours. CBG: Recent Labs  Lab 10/27/19 0404  GLUCAP 88   Lipid Profile: No results for input(s): CHOL, HDL, LDLCALC, TRIG, CHOLHDL, LDLDIRECT in the last 72 hours. Thyroid Function Tests: No results for input(s): TSH, T4TOTAL, FREET4, T3FREE, THYROIDAB in the last 72 hours. Anemia Panel: No results for input(s): VITAMINB12, FOLATE, FERRITIN, TIBC, IRON, RETICCTPCT in the last 72 hours. Urine analysis:    Component Value Date/Time   COLORURINE YELLOW 10/27/2019 [place] 10/27/2019 0358   LABSPEC 1.014 10/27/2019 0358   PHURINE 5.0 10/27/2019 0358   GLUCOSEU NEGATIVE 10/27/2019 0358   HGBUR MODERATE (A) 10/27/2019 0358   BILIRUBINUR NEGATIVE 10/27/2019 0358   BILIRUBINUR NEG 05/21/2014 1155   KETONESUR 5 (A) 10/27/2019 0358   PROTEINUR NEGATIVE 10/27/2019 0358   UROBILINOGEN 0.2 09/30/2019 1557   NITRITE NEGATIVE 10/27/2019  0358   LEUKOCYTESUR NEGATIVE 10/27/2019 0358    Radiological Exams on Admission: CT Head Wo Contrast  Result Date: 10/27/2019 CLINICAL DATA:  Delirium, confusion EXAM: CT HEAD WITHOUT CONTRAST TECHNIQUE: Contiguous axial images were obtained from the base of the skull through the vertex without intravenous contrast. COMPARISON:  None. FINDINGS: Brain: Normal anatomic configuration. No abnormal intra or extra-axial mass lesion or fluid collection. No abnormal mass effect or midline shift. No evidence of acute intracranial hemorrhage or infarct. Ventricular size is normal. Cerebellum unremarkable. Vascular: Unremarkable Skull: Intact Sinuses/Orbits: Paranasal sinuses are clear. Orbits are unremarkable. Other: Mastoid air cells and middle ear cavities are clear. IMPRESSION: No acute intracranial hemorrhage or infarct. Electronically Signed   By: Fidela Salisbury MD   On: 10/27/2019 04:02   US RENAL  Result Date: 10/27/2019 CLINICAL DATA:  Acute renal failure. EXAM: RENAL / URINARY TRACT ULTRASOUND COMPLETE COMPARISON:  09/18/2016 FINDINGS: Right Kidney: Renal measurements: 9.7 x 4.8 x 4.5 cm = volume: 109 mL. Increased echogenicity. No mass or hydronephrosis visualized. Left Kidney: Renal measurements: 9.9 x 5.0 x 3.8 cm = volume: 100 mL. Increased echogenicity. No mass or hydronephrosis visualized. Bladder: Appears normal for degree of bladder distention. Other: A cystic mass anterior to the bladder measures 7.1 x 7.0 x 8.0 cm, not evident on the prior ultrasound. Multiple small stones were noted in the gallbladder, not evaluated in detail on this renal ultrasound. IMPRESSION: 1. Echogenic kidneys compatible with medical renal disease. No hydronephrosis. 2. 8 cm cystic mass anterior  to the bladder. Consider CT for further evaluation. 3. Cholelithiasis. Electronically Signed   By: Logan Bores M.D.   On: 10/27/2019 06:51   DG Chest Port 1 View  Result Date: 10/27/2019 CLINICAL DATA:  Altered mental  status. EXAM: PORTABLE CHEST 1 VIEW COMPARISON:  03/30/2019 FINDINGS: Telemetry leads overlie the chest. The cardiomediastinal silhouette is unchanged with normal heart size. The lungs are hypoinflated with mild hazy opacity in the left mid lung. No sizable pleural effusion or pneumothorax is identified. No acute osseous abnormality is seen. IMPRESSION: Hypoinflation with mild hazy left mid lung opacity which may reflect atelectasis or pneumonia. Electronically Signed   By: Logan Bores M.D.   On: 10/27/2019 04:00    EKG: Independently reviewed. NSR, no st changes, long Qtc  Assessment/Plan AKI on CKD3b NGMA Uremia     - admit to inpatient, telemetry     - bicarb 150/D5 at 100cc/hr     - renal US ok, UA ok     - spoke with nephrology; no need for transfer to [facility] just yet; they will eval her here     - repeat BMP at noon  AMS     - weakly responsive to verbal stimuli but does not participate in interview/exam     - UA ok, CXR w/ possible PNA in LLL; no WBC, no fever, no resp distress, sats are good; check procal, if positive, will start rocephin     - CTH negative     - COVID negative     - recent c/o diarrhea w/ PCP, none noted so far at [facility]     - let's check stool; cancel order if no sample given in 24hrs  Pre-diabetes     - A1c is 5.6%     - [name] unable to participate in interview, I do not see home DM meds     - will glucose check for 24 hours, if stable, can d/c  Neuropathy     - on gabapentin at home per old med hx; need to clarify   HLD     - continue home lipitor  HTN     - resume home prazosin, norvasc; hold metolazone     - add PRN hydralazine  COPD Chronic respiratory failure w/ hypoxia/hypercapnia OSA     - follows with  Pulm     - on 2L [place] at base line     - she has been prescribed a trilogy ventilatory for use every night     - continue O2, breathing treatments  Elevated alkaline phosphatase     - had elevation last month as well     - check  ggt  Gout     - hold colchicine  Schizoaffective, bipolar type     - holding several meds d/t sedation and Qtc prolongation  DVT prophylaxis: heparin  Code Status: FULL  Family Communication: No contacts available  Consults called: Nephrology  Admission status: Inpatient  Status is: Inpatient  Remains inpatient appropriate because:Altered mental status   Dispo: The patient is from: Home              Anticipated d/c is to: Home              Anticipated d/c date is: > 3 days              Patient currently is not medically stable to d/c.  Time spent coordinating admission: 75 minutes  [place]  [facility]  If 7PM-7AM, please contact night-coverage [email or web address]  10/27/2019, 7:12 AM

## 2019-10-27 NOTE — ED Provider Notes (Signed)
[facility] DEPT Provider Note   CSN: [id number] Arrival date & time: 10/27/19  0259     History Chief Complaint  Patient presents with  . Altered Mental Status  Level 5 caveat due to altered mental status  Tiffany [name] is a 59 y.o. female.  The history is provided by the patient and the EMS personnel. The history is limited by the condition of the patient.  Altered Mental Status Presenting symptoms: lethargy   Severity:  Severe Most recent episode:  Today Timing:  Constant Progression:  Unchanged Chronicity:  New  Patient with extensive history including chronic kidney disease, CHF, previous substance abuse, COPD, previous history of cardiac arrest presents for altered mental status.  It is reported the family called due to her having increasing confusion and weakness.  It is also reported that she has foul-smelling urine.  Patient is unable to contribute to the history    Past Medical History:  Diagnosis Date  . Agitation 11/22/2017  . Anoxic brain injury ([place]) 09/08/2016   C. Arrest due to respiratory failure and COPD exacerbation  . Anxiety   . Arthritis    "all over" (04/10/2016)  . Asthma 10/18/2010  . Binge eating disorder   . Cardiac arrest ([place]) 09/08/2016   PEA  . Carotid artery stenosis    1-39% bilateral by dopplers 11/2016  . Chronic diastolic (congestive) heart failure ([place])   . Chronic kidney disease    "I see a kidney dr." (04/10/2016)  . Chronic pain syndrome 06/18/2012  . Chronic post-traumatic stress disorder (PTSD) 05/27/2018  . Chronic respiratory failure with hypoxia and hypercapnia (HCC) 06/22/2015   TRILOGY Vent >AVAPA-ES., Vt target 200-400, Max P 30 , PS max 20 , PS min 6-10 , E Max 6, E Min 4, Rate Auto AVAPS Rate 2 (titrate for pt comfort) , bleed O2 at 5l/m continuous flow .   [name] CKD (chronic kidney disease) stage 3, GFR 30-59 ml/min (HCC) 12/15/2016  . Closed displaced fracture of fifth metacarpal bone 03/21/2018   . Cocaine use disorder, severe, in sustained remission ([place]) 12/17/2015  . Complication of anesthesia    decreased bp, decreased heart rate  . COPD (chronic obstructive pulmonary disease) ([place]) 07/08/2014  . Depression   . Diabetic neuropathy ([place]) 04/24/2011  . Difficulty with speech 01/24/2018  . Disorder of nervous system   . Drug abuse ([place]) 11/21/2017  . Dyslipidemia 04/24/2011  . Elevated troponin 04/28/2012  . Emphysema   . Encephalopathy 11/21/2017  . Essential hypertension 03/22/2016  . Fibula fracture 07/10/2016  . Frequent falls 10/11/2017  . GERD (gastroesophageal reflux disease)   . Gout 04/11/2017  . Heart attack ([place]) 1980s  . History of blood transfusion 1994   "couldn't stop bleeding from my period"  . History of drug abuse in remission ([place]) 11/28/2015   Quit in 2017  . Hyperlipidemia LDL goal <70   . Incontinence   . Manic depression ([place])   . Morbid obesity ([place]) 10/18/2010  . Obstructive sleep apnea 10/18/2010  . On home oxygen therapy    "6L; 24/7" (04/10/2016)  . OSA on CPAP    "wear mask sometimes" (04/10/2016)  . Paranoid ([place])    "sometimes; I'm on RX for it" (04/10/2016)  . Prolonged Q-T interval on ECG   . Rectal bleeding 12/31/2015  . Right carotid bruit 11/09/2016  . Schizoaffective disorder, bipolar type ([place]) 04/05/2018  . Seasonal allergies   . Seborrheic keratoses 12/31/2013  . Seizures ([place])    "  don't know what kind; last one was ~ 1 yr ago" (04/10/2016)  . Stroke [facility]) 1980s   denies residual on 04/10/2016  . Thrush 09/19/2013  . Type 2 diabetes mellitus ([place]) 10/18/2010    Patient Active Problem List   Diagnosis Date Noted  . Family discord 02/04/2019  . Aggressive behavior   . PTSD (post-traumatic stress disorder) 05/27/2018  . Schizoaffective disorder, bipolar type ([place]) 04/05/2018  . Closed displaced fracture of fifth metacarpal bone 03/21/2018  . Difficulty with speech 01/24/2018  . Encephalopathy 11/21/2017  . Drug abuse ([place]) 11/21/2017  .  Frequent falls 10/11/2017  . Binge eating disorder   . Dependence on continuous supplemental oxygen 05/14/2017  . Gout 04/11/2017  . CKD (chronic kidney disease) stage 3, GFR 30-59 ml/min (HCC) 12/15/2016  . Carotid artery stenosis   . Osteoarthritis 10/26/2016  . Anoxic brain injury ([place]) 09/08/2016  . Overactive bladder 06/07/2016  . QT prolongation   . OSA and COPD overlap syndrome ([place])   . Arthritis   . Essential hypertension 03/22/2016  . Cocaine use disorder, severe, in sustained remission ([place]) 12/17/2015  . Bipolar affective disorder, mixed, severe, with psychotic behavior ([place]) 11/28/2015  . History of drug abuse in remission ([place]) 11/28/2015  . Chronic diastolic congestive heart failure ([place])   . Chronic respiratory failure with hypoxia and hypercapnia ([place]) 06/22/2015  . Tobacco use disorder 07/22/2014  . COPD (chronic obstructive pulmonary disease) ([place]) 07/08/2014  . Seizure ([place]) 01/04/2013  . Chronic pain syndrome 06/18/2012  . Dyslipidemia 04/24/2011  . Diabetic neuropathy ([place]) 04/24/2011  . Morbid obesity ([place]) 10/18/2010  . Type 2 diabetes mellitus ([place]) 10/18/2010    Past Surgical History:  Procedure Laterality Date  . CESAREAN SECTION  1997  . HERNIA REPAIR    . IR CHOLANGIOGRAM EXISTING TUBE  07/20/2016  . IR PERC CHOLECYSTOSTOMY  05/10/2016  . IR RADIOLOGIST EVAL & MGMT  06/08/2016  . IR RADIOLOGIST EVAL & MGMT  06/29/2016  . IR SINUS/FIST TUBE CHK-NON GI  07/12/2016  . RIGHT/LEFT HEART CATH AND CORONARY ANGIOGRAPHY N/A 06/19/2017   Procedure: RIGHT/LEFT HEART CATH AND CORONARY ANGIOGRAPHY;  Surgeon: Jolaine Artist, MD;  Location: [facility];  Service: Cardiovascular;  Laterality: N/A;  . TIBIA IM NAIL INSERTION Right 07/12/2016   Procedure: INTRAMEDULLARY (IM) NAIL RIGHT TIBIA;  Surgeon: Leandrew Koyanagi, MD;  Location: [facility];  Service: Orthopedics;  Laterality: Right;  . UMBILICAL HERNIA REPAIR  ~ 1963   "that's why I don't have a belly button"  .  VAGINAL HYSTERECTOMY       OB History    Gravida  4   Para  4   Term  3   Preterm  1   AB  0   Living  3     SAB  0   TAB  0   Ectopic  0   Multiple  0   Live Births  3           Family History  Problem Relation Age of Onset  . Cancer Father        prostate  . Cancer Mother        lung  . Depression Mother   . Depression Sister   . Anxiety disorder Sister   . Schizophrenia Sister   . Bipolar disorder Sister   . Depression Sister   . Depression Brother   . Heart failure Other        cousin  Social History   Tobacco Use  . Smoking status: Former Smoker    Packs/day: 1.50    Years: 38.00    Pack years: 57.00    Types: Cigarettes    Start date: 03/13/1977    Quit date: 04/10/2016    Years since quitting: 3.5  . Smokeless tobacco: Never Used  Vaping Use  . Vaping Use: Never used  Substance Use Topics  . Alcohol use: No    Alcohol/week: 0.0 standard drinks  . Drug use: No    Types: Cocaine    Comment: 04/10/2016 "last used cocaine back in November 2017"    Home Medications Prior to Admission medications   Medication Sig Start Date End Date Taking? Authorizing Provider  albuterol (PROVENTIL) (2.5 MG/3ML) 0.083% nebulizer solution Take 3 mLs (2.5 mg total) by nebulization every 6 (six) hours as needed for wheezing or shortness of breath. 04/29/19   Magdalen Spatz, NP  albuterol (VENTOLIN HFA) 108 (90 Base) MCG/ACT inhaler Inhale 2 puffs into the lungs every 6 (six) hours as needed for wheezing or shortness of breath. 10/09/19   Collene Gobble, MD  amLODipine (NORVASC) 10 MG tablet Take 1 tablet (10 mg total) by mouth daily. 09/08/19   [name], DO  Asenapine Maleate 10 MG SUBL Place 1 tablet (10 mg total) under the tongue in the morning, at noon, and at bedtime. 10/06/19 01/04/20  Pucilowski, Marchia Bond, MD  atorvastatin (LIPITOR) 80 MG tablet Take 1 tablet (80 mg total) by mouth daily. 12/10/18   Maximiano Coss, NP  colchicine 0.6  MG tablet Take 1 tablet (0.6 mg total) by mouth 2 (two) times daily. Patient taking differently: Take 1.2 mg by mouth 2 (two) times daily.  09/08/19   [name], DO  diazepam (VALIUM) 10 MG tablet Take 1 tablet (10 mg total) by mouth 3 (three) times daily. 10/06/19 01/04/20  Pucilowski, Marchia Bond, MD  diclofenac Sodium (VOLTAREN) 1 % GEL Apply 2 g topically 4 (four) times daily. 09/10/19   [name], DO  escitalopram (LEXAPRO) 20 MG tablet Take 2 tablets (40 mg total) by mouth daily. 08/29/19 11/27/19  Pucilowski, Marchia Bond, MD  febuxostat (ULORIC) 40 MG tablet Take 1 tablet (40 mg total) by mouth daily. 09/11/19   [name], DO  ferrous sulfate 325 (65 FE) MG tablet Take 1 tablet (325 mg total) by mouth 2 (two) times daily with a meal. 09/08/19   [name], DO  Fluticasone-Umeclidin-Vilant (TRELEGY ELLIPTA) 100-62.5-25 MCG/INH AEPB Inhale 1 puff into the lungs daily. 10/09/19   Collene Gobble, MD  gabapentin (NEURONTIN) 600 MG tablet Take 1 tablet (600 mg total) by mouth 2 (two) times daily. 08/04/19   Pucilowski, Marchia Bond, MD  lisdexamfetamine (VYVANSE) 70 MG capsule Take 1 capsule (70 mg total) by mouth daily before breakfast. 10/06/19 11/05/19  Pucilowski, Marchia Bond, MD  metolazone (ZAROXOLYN) 2.5 MG tablet Take 1 tablet (2.5 mg total) by mouth in the morning, at noon, and at bedtime. 09/08/19   [name], DO  montelukast (SINGULAIR) 10 MG tablet Take 10 mg by mouth at bedtime. 03/10/19   [provider]  Multiple Vitamin (MULTIVITAMIN WITH MINERALS) TABS tablet Take 1 tablet by mouth daily.    [provider]  omeprazole (PRILOSEC) 40 MG capsule Take 1 capsule (40 mg total) by mouth 2 (two) times daily. 09/08/19   [name], DO  oxybutynin (DITROPAN-XL) 10 MG 24 hr tablet Take 1 tablet (  10 mg total) by mouth at bedtime. 09/08/19   [name], DO  potassium chloride SA (KLOR-CON)  20 MEQ tablet Take 2 tablets (40 mEq total) by mouth 2 (two) times daily. 05/26/19   [name], DO  prazosin (MINIPRESS) 5 MG capsule Take 1 capsule (5 mg total) by mouth 2 (two) times daily. 10/06/19 01/04/20  Pucilowski, Marchia Bond, MD  traZODone (DESYREL) 100 MG tablet Take 1 tablet (100 mg total) by mouth at bedtime as needed for sleep. 10/06/19 01/04/20  Pucilowski, Marchia Bond, MD  budesonide-formoterol (SYMBICORT) 80-4.5 MCG/ACT inhaler Inhale 2 puffs into the lungs 2 (two) times daily. Patient not taking: Reported on 09/29/2019 11/15/18 09/29/19  Maximiano Coss, NP    Allergies    Hydrocodone, Hydrocodone-acetaminophen, Hydroxyzine, Latuda [lurasidone hcl], Lurasidone, Magnesium-containing compounds, Prednisone, Tramadol, Codeine, Other, Sulfa antibiotics, and Tape  Review of Systems   Review of Systems  Unable to perform ROS: Mental status change    Physical Exam Updated Vital Signs BP (!) 167/98   Pulse 66   Temp 97.8 F (36.6 C) (Rectal)   Resp 13   SpO2 99%   Physical Exam CONSTITUTIONAL: Disheveled, no acute distress HEAD: Normocephalic/atraumatic, no signs of trauma EYES: EOMI/PERRL, no nystagmus ENMT: Mucous membranes moist NECK: supple no meningeal signs SPINE/BACK:entire spine nontender CV: S1/S2 noted, no murmurs/rubs/gallops noted LUNGS: Lungs are clear to auscultation bilaterally, no apparent distress ABDOMEN: soft, nontender NEURO: Pt is awake/alert.  [name] slow to respond but will answer questions about her name and date of birth, but is unable to provide any other further details.  [name] able to move all extremities x4, but appears to have minimal effort EXTREMITIES: pulses normal/equalx4, full ROM, no deformities, pelvis stable SKIN: warm, color normal PSYCH: Unable to assess  ED Results / Procedures / Treatments   Labs (all labs ordered are listed, but only abnormal results are displayed) Labs Reviewed  CBC WITH DIFFERENTIAL/PLATELET -  Abnormal; Notable for the following components:      Result Value   RDW 16.5 (*)    nRBC 0.4 (*)    Lymphs Abs 0.6 (*)    Monocytes Absolute 1.1 (*)    Abs Immature Granulocytes 0.83 (*)    All other components within normal limits  COMPREHENSIVE METABOLIC PANEL - Abnormal; Notable for the following components:   Potassium 6.8 (*)    Chloride 116 (*)    CO2 14 (*)    Creatinine, Ser 3.95 (*)    Albumin 3.1 (*)    Alkaline Phosphatase 175 (*)    Total Bilirubin 1.4 (*)    GFR, Estimated 12 (*)    All other components within normal limits  URINALYSIS, ROUTINE W REFLEX MICROSCOPIC - Abnormal; Notable for the following components:   Hgb urine dipstick MODERATE (*)    Ketones, ur 5 (*)    Bacteria, UA RARE (*)    All other components within normal limits  BLOOD GAS, ARTERIAL - Abnormal; Notable for the following components:   pH, Arterial 7.273 (*)    pCO2 arterial 29.8 (*)    Bicarbonate 13.3 (*)    Acid-base deficit 12.1 (*)    All other components within normal limits  ACETAMINOPHEN LEVEL - Abnormal; Notable for the following components:   Acetaminophen (Tylenol), Serum <10 (*)    All other components within normal limits  SALICYLATE LEVEL - Abnormal; Notable for the following components:   Salicylate Lvl <5.8 (*)    All other components within normal  limits  I-STAT CHEM 8, ED - Abnormal; Notable for the following components:   Chloride 120 (*)    BUN 92 (*)    Creatinine, Ser 3.90 (*)    TCO2 17 (*)    All other components within normal limits  RESPIRATORY PANEL BY RT PCR (FLU A&B, COVID)  AMMONIA  LACTIC ACID, PLASMA  ETHANOL  RAPID URINE DRUG SCREEN, HOSP PERFORMED  CBG MONITORING, ED    EKG EKG Interpretation  Date/Time:  Monday October 27 2019 03:19:07 EDT Ventricular Rate:  67 PR Interval:    QRS Duration: 86 QT Interval:  435 QTC Calculation: 460 R Axis:   7 Text Interpretation: Sinus rhythm Confirmed by Ripley Fraise [phone]) on 10/27/2019 4:06:00  AM   Radiology CT Head Wo Contrast  Result Date: 10/27/2019 CLINICAL DATA:  Delirium, confusion EXAM: CT HEAD WITHOUT CONTRAST TECHNIQUE: Contiguous axial images were obtained from the base of the skull through the vertex without intravenous contrast. COMPARISON:  None. FINDINGS: Brain: Normal anatomic configuration. No abnormal intra or extra-axial mass lesion or fluid collection. No abnormal mass effect or midline shift. No evidence of acute intracranial hemorrhage or infarct. Ventricular size is normal. Cerebellum unremarkable. Vascular: Unremarkable Skull: Intact Sinuses/Orbits: Paranasal sinuses are clear. Orbits are unremarkable. Other: Mastoid air cells and middle ear cavities are clear. IMPRESSION: No acute intracranial hemorrhage or infarct. Electronically Signed   By: Fidela Salisbury MD   On: 10/27/2019 04:02   DG Chest Port 1 View  Result Date: 10/27/2019 CLINICAL DATA:  Altered mental status. EXAM: PORTABLE CHEST 1 VIEW COMPARISON:  03/30/2019 FINDINGS: Telemetry leads overlie the chest. The cardiomediastinal silhouette is unchanged with normal heart size. The lungs are hypoinflated with mild hazy opacity in the left mid lung. No sizable pleural effusion or pneumothorax is identified. No acute osseous abnormality is seen. IMPRESSION: Hypoinflation with mild hazy left mid lung opacity which may reflect atelectasis or pneumonia. Electronically Signed   By: Logan Bores M.D.   On: 10/27/2019 04:00    Procedures Procedures  Medications Ordered in ED Medications  dextrose 5 % 1,000 mL with sodium bicarbonate 150 mEq infusion (has no administration in time range)    ED Course  I have reviewed the triage vital signs and the nursing notes.  Pertinent labs & imaging results that were available during my care of the patient were reviewed by me and considered in my medical decision making (see chart for details).    MDM Rules/Calculators/A&P                          3:41 AM Patient  presents for altered mental status of unclear etiology and unclear time frame.  There is no family available at bedside or via phone for further details.  Extensive work-up has been initiated 5:04 AM Pt found to have worsening renal failure Also noted to have HYPERkalemia, but likely due to hemolysis Will recheck labs Pt awake/alert but still mildly confused 5:26 AM Recheck of her labs reveal a normal potassium.  However creatinine is worsening.  Patient is still not at baseline and appears confused She will need to be admitted to hospital. 5:58 AM Discussed with Dr. Hal Hope for admission.  He recommends bicarb drip due to mild acidosis.  He will enter those orders. Patient is resting/sleeping, but will respond to voice. [name] afebrile, low suspicion for meningitis.  Suspect her delirium is due to metabolic derangement.   This patient presents to  the ED for concern of altered mental status, this involves an extensive number of treatment options, and is a complaint that carries with it a high risk of complications and morbidity.  The differential diagnosis includes urinary tract infection, pneumonia, stroke, intracranial hemorrhage, electrolyte derangement, overdose   Lab Tests:   I Ordered, reviewed, and interpreted labs, which included BMP, LFTs, drug screen, alcohol level, urinalysis, lactic acid   Imaging Studies ordered:   I ordered imaging studies which included CT head and chest x-ray  I independently visualized and interpreted imaging which showed no acute findings  Additional history obtained:    Previous records obtained and reviewed   Consultations Obtained:   I consulted Dr. Hal Hope [facility] hospitalist and discussed lab and imaging findings  Reevaluation:  After the interventions stated above, I reevaluated the patient and found patient is stable   Final Clinical Impression(s) / ED Diagnoses Final diagnoses:  Delirium  AKI (acute kidney injury) [facility])     Rx / [place] Orders ED Discharge Orders    None       Ripley Fraise, MD 10/27/19 0600

## 2019-10-27 NOTE — ED Triage Notes (Signed)
Patient arrived via [facility] after mother called for patient having increased confusion and generalized weakness. Reports odor in urine. 200cc given en route.

## 2019-10-27 NOTE — Consult Note (Signed)
[facility] Renal Consultation Note  Requesting MD:  Indication for Consultation: Acute on chronic kidney injury, maintenance euvolemia, assessment and treatment of electrolyte abnormalities, assessment treatment of acid-base abnormalities  HPI:   Tiffany [name] is a 59 y.o. female.  With a baseline serum creatinine about 1.5 to 1.8 mg/dL.  She has a history of diabetes hypertension COPD.  She presented with altered mental status.  She has a previous history of cardiac arrest PEA.  Following that she has had hypoxic brain injury and a diagnosis of schizoaffective disorder.  She also has a prior history of drug use.  Home medications amlodipine 10 mg daily asenapine 10 mg sublingual 3 times daily, atorvastatin 80 mg daily calcium 0.6 mg twice daily Valium 10 mg daily Uloric 40 mg daily iron sulfate 325 mg twice daily Neurontin 600 mg twice daily for events 70 mg q. a.m. Zaroxolyn 2.5 mg daily, Singulair 10 mg daily omeprazole 40 mg daily oxybutynin 10 mg every 24 hours potassium chloride 20 mg, Minipress 5 mg daily trazodone 100 mg nightly  Blood pressure 144/85 pulse 78 temperature 97.8 O2 sats 9 9% room air  Sodium 145 potassium 4.8 chloride 120 CO2 14 BUN 92 creatinine 3.9 albumin 3.1 calcium 9.2 liver enzymes within normal range.  Alcohol level less than 10 salicylate level < 7, + for benzos.  Urinalysis revealed moderate blood but no red blood cells or white blood cells.     Creat  Date/Time Value Ref Range Status  02/18/2016 11:46 AM 1.22 (H) 0.50 - 1.05 mg/dL Final    Comment:      For patients > or = 60 years of age: The upper reference limit for Creatinine is approximately 13% higher for people identified as African-American.     12/16/2015 12:29 PM 1.50 (H) 0.50 - 1.05 mg/dL Final    Comment:      For patients > or = 60 years of age: The upper reference limit for Creatinine is approximately 13% higher for people identified as African-American.      08/24/2015 03:05 PM 1.06 (H) 0.50 - 1.05 mg/dL Final    Comment:      For patients > or = 60 years of age: The upper reference limit for Creatinine is approximately 13% higher for people identified as African-American.     01/20/2015 04:11 PM 1.13 (H) 0.50 - 1.05 mg/dL Final  12/07/2014 11:16 AM 0.98 0.50 - 1.05 mg/dL Final  10/20/2014 02:12 PM 1.40 (H) 0.50 - 1.05 mg/dL Final  05/21/2014 11:53 AM 1.08 0.50 - 1.10 mg/dL Final  12/31/2013 04:51 PM 1.01 0.50 - 1.10 mg/dL Final  07/15/2013 02:59 PM 0.79 0.50 - 1.10 mg/dL Final  09/30/2012 12:32 PM 0.99 0.50 - 1.10 mg/dL Final  04/26/2012 02:30 PM 0.89 0.50 - 1.10 mg/dL Final  04/05/2012 12:10 PM 1.57 (H) 0.50 - 1.10 mg/dL Final   Creatinine, Ser  Date/Time Value Ref Range Status  10/27/2019 05:23 AM 3.90 (H) 0.44 - 1.00 mg/dL Final  10/27/2019 03:58 AM 3.95 (H) 0.44 - 1.00 mg/dL Final  09/28/2019 01:42 PM 2.23 (H) 0.44 - 1.00 mg/dL Final  03/30/2019 01:18 AM 1.48 (H) 0.44 - 1.00 mg/dL Final  03/21/2019 05:04 AM 1.30 (H) 0.44 - 1.00 mg/dL Final  02/03/2019 02:08 PM 1.53 (H) 0.44 - 1.00 mg/dL Final  11/08/2018 05:43 PM 1.39 (H) 0.57 - 1.00 mg/dL Final  10/22/2018 05:01 PM 1.39 (H) 0.44 - 1.00 mg/dL Final  10/19/2018 03:00 AM 1.80 (H) 0.44 - 1.00 mg/dL  Final  10/19/2018 02:57 AM 1.86 (H) 0.44 - 1.00 mg/dL Final  10/07/2018 07:51 AM 1.82 (H) 0.44 - 1.00 mg/dL Final  10/06/2018 10:22 PM 2.18 (H) 0.44 - 1.00 mg/dL Final  10/02/2018 05:47 AM 1.25 (H) 0.44 - 1.00 mg/dL Final  08/05/2018 05:48 PM 1.27 (H) 0.57 - 1.00 mg/dL Final  02/27/2018 01:05 PM 2.38 (H) 0.44 - 1.00 mg/dL Final  12/25/2017 12:20 PM 2.19 (H) 0.44 - 1.00 mg/dL Final  11/27/2017 02:44 PM 1.54 (H) 0.57 - 1.00 mg/dL Final  11/23/2017 05:34 AM 1.92 (H) 0.44 - 1.00 mg/dL Final    Comment:    DELTA CHECK NOTED  11/22/2017 02:53 AM 3.49 (H) 0.44 - 1.00 mg/dL Final  11/21/2017 07:47 PM 4.14 (H) 0.44 - 1.00 mg/dL Final  10/17/2017 01:56 PM 1.64 (H) 0.57 - 1.00 mg/dL Final   10/11/2017 09:25 AM 1.71 (H) 0.57 - 1.00 mg/dL Final  07/23/2017 02:50 PM 1.63 (H) 0.57 - 1.00 mg/dL Final  07/09/2017 12:25 PM 2.58 (H) 0.57 - 1.00 mg/dL Final  06/25/2017 04:26 PM 2.35 (H) 0.57 - 1.00 mg/dL Final  06/19/2017 01:02 PM 1.50 (H) 0.44 - 1.00 mg/dL Final  06/19/2017 11:38 AM 1.82 (H) 0.44 - 1.00 mg/dL Final  06/13/2017 04:55 PM 2.33 (H) 0.44 - 1.00 mg/dL Final  05/12/2017 07:32 PM 1.23 (H) 0.44 - 1.00 mg/dL Final  03/28/2017 01:32 PM 1.63 (H) 0.44 - 1.00 mg/dL Final  03/12/2017 03:00 PM 1.80 (H) 0.57 - 1.00 mg/dL Final  03/08/2017 03:45 PM 1.39 (H) 0.44 - 1.00 mg/dL Final  02/23/2017 12:00 PM 1.52 (H) 0.57 - 1.00 mg/dL Final  01/25/2017 03:37 PM 1.55 (H) 0.44 - 1.00 mg/dL Final  01/10/2017 04:18 PM 1.33 (H) 0.57 - 1.00 mg/dL Final  01/07/2017 08:42 AM 1.46 (H) 0.44 - 1.00 mg/dL Final  01/06/2017 09:39 PM 1.54 (H) 0.44 - 1.00 mg/dL Final  01/06/2017 05:08 AM 1.61 (H) 0.44 - 1.00 mg/dL Final  01/06/2017 12:46 AM 1.65 (H) 0.44 - 1.00 mg/dL Final  12/20/2016 01:55 PM 1.52 (H) 0.44 - 1.00 mg/dL Final  12/14/2016 03:11 PM 1.81 (H) 0.44 - 1.00 mg/dL Final  12/05/2016 05:16 PM 1.69 (H) 0.57 - 1.00 mg/dL Final  12/01/2016 03:17 PM 2.05 (H) 0.57 - 1.00 mg/dL Final  11/15/2016 03:03 PM 1.87 (H) 0.57 - 1.00 mg/dL Final  11/09/2016 12:00 AM 1.46 (H) 0.57 - 1.00 mg/dL Final  11/06/2016 03:50 PM 1.40 (H) 0.57 - 1.00 mg/dL Final  11/05/2016 04:31 PM 1.72 (H) 0.44 - 1.00 mg/dL Final  11/05/2016 06:03 AM 1.58 (H) 0.44 - 1.00 mg/dL Final  11/04/2016 03:07 PM 1.63 (H) 0.44 - 1.00 mg/dL Final  11/04/2016 06:58 AM 1.50 (H) 0.44 - 1.00 mg/dL Final  11/03/2016 03:23 PM 1.57 (H) 0.44 - 1.00 mg/dL Final  10/31/2016 03:44 PM 1.04 (H) 0.57 - 1.00 mg/dL Final     PMHx:   Past Medical History:  Diagnosis Date  . Agitation 11/22/2017  . Anoxic brain injury ([place]) 09/08/2016   C. Arrest due to respiratory failure and COPD exacerbation  . Anxiety   . Arthritis    "all over" (04/10/2016)  .  Asthma 10/18/2010  . Binge eating disorder   . Cardiac arrest ([place]) 09/08/2016   PEA  . Carotid artery stenosis    1-39% bilateral by dopplers 11/2016  . Chronic diastolic (congestive) heart failure ([place])   . Chronic kidney disease    "I see a kidney dr." (04/10/2016)  . Chronic pain syndrome 06/18/2012  . Chronic post-traumatic stress  disorder (PTSD) 05/27/2018  . Chronic respiratory failure with hypoxia and hypercapnia (HCC) 06/22/2015   TRILOGY Vent >AVAPA-ES., Vt target 200-400, Max P 30 , PS max 20 , PS min 6-10 , E Max 6, E Min 4, Rate Auto AVAPS Rate 2 (titrate for pt comfort) , bleed O2 at 5l/m continuous flow .   [name] CKD (chronic kidney disease) stage 3, GFR 30-59 ml/min (HCC) 12/15/2016  . Closed displaced fracture of fifth metacarpal bone 03/21/2018  . Cocaine use disorder, severe, in sustained remission ([place]) 12/17/2015  . Complication of anesthesia    decreased bp, decreased heart rate  . COPD (chronic obstructive pulmonary disease) ([place]) 07/08/2014  . Depression   . Diabetic neuropathy ([place]) 04/24/2011  . Difficulty with speech 01/24/2018  . Disorder of nervous system   . Drug abuse ([place]) 11/21/2017  . Dyslipidemia 04/24/2011  . Elevated troponin 04/28/2012  . Emphysema   . Encephalopathy 11/21/2017  . Essential hypertension 03/22/2016  . Fibula fracture 07/10/2016  . Frequent falls 10/11/2017  . GERD (gastroesophageal reflux disease)   . Gout 04/11/2017  . Heart attack ([place]) 1980s  . History of blood transfusion 1994   "couldn't stop bleeding from my period"  . History of drug abuse in remission ([place]) 11/28/2015   Quit in 2017  . Hyperlipidemia LDL goal <70   . Incontinence   . Manic depression ([place])   . Morbid obesity ([place]) 10/18/2010  . Obstructive sleep apnea 10/18/2010  . On home oxygen therapy    "6L; 24/7" (04/10/2016)  . OSA on CPAP    "wear mask sometimes" (04/10/2016)  . Paranoid ([place])    "sometimes; I'm on RX for it" (04/10/2016)  . Prolonged Q-T interval on ECG   . Rectal  bleeding 12/31/2015  . Right carotid bruit 11/09/2016  . Schizoaffective disorder, bipolar type ([place]) 04/05/2018  . Seasonal allergies   . Seborrheic keratoses 12/31/2013  . Seizures ([place])    "don't know what kind; last one was ~ 1 yr ago" (04/10/2016)  . Stroke [facility]) 1980s   denies residual on 04/10/2016  . Thrush 09/19/2013  . Type 2 diabetes mellitus ([place]) 10/18/2010    Past Surgical History:  Procedure Laterality Date  . CESAREAN SECTION  1997  . HERNIA REPAIR    . IR CHOLANGIOGRAM EXISTING TUBE  07/20/2016  . IR PERC CHOLECYSTOSTOMY  05/10/2016  . IR RADIOLOGIST EVAL & MGMT  06/08/2016  . IR RADIOLOGIST EVAL & MGMT  06/29/2016  . IR SINUS/FIST TUBE CHK-NON GI  07/12/2016  . RIGHT/LEFT HEART CATH AND CORONARY ANGIOGRAPHY N/A 06/19/2017   Procedure: RIGHT/LEFT HEART CATH AND CORONARY ANGIOGRAPHY;  Surgeon: Jolaine Artist, MD;  Location: [facility];  Service: Cardiovascular;  Laterality: N/A;  . TIBIA IM NAIL INSERTION Right 07/12/2016   Procedure: INTRAMEDULLARY (IM) NAIL RIGHT TIBIA;  Surgeon: Leandrew Koyanagi, MD;  Location: [facility];  Service: Orthopedics;  Laterality: Right;  . UMBILICAL HERNIA REPAIR  ~ 1963   "that's why I don't have a belly button"  . VAGINAL HYSTERECTOMY      Family Hx:  Family History  Problem Relation Age of Onset  . Cancer Father        prostate  . Cancer Mother        lung  . Depression Mother   . Depression Sister   . Anxiety disorder Sister   . Schizophrenia Sister   . Bipolar disorder Sister   . Depression Sister   . Depression Brother   .  Heart failure Other        cousin    Social History:  reports that she quit smoking about 3 years ago. Her smoking use included cigarettes. She started smoking about 42 years ago. She has a 57.00 pack-year smoking history. She has never used smokeless tobacco. She reports that she does not drink alcohol and does not use drugs.  Allergies:  Allergies  Allergen Reactions  . Hydrocodone Shortness Of  Breath  . Hydrocodone-Acetaminophen Shortness Of Breath  . Hydroxyzine Anaphylaxis and Shortness Of Breath  . Latuda [Lurasidone Hcl] Anaphylaxis  . Lurasidone Anaphylaxis  . Magnesium-Containing Compounds Anaphylaxis  . Prednisone Anaphylaxis, Swelling and Other (See Comments)    Tongue swelling  . Tramadol Anaphylaxis and Swelling  . Codeine Nausea And Vomiting  . Other Rash  . Sulfa Antibiotics Itching  . Tape Rash    Medications: Prior to Admission medications   Medication Sig Start Date End Date Taking? Authorizing Provider  albuterol (PROVENTIL) (2.5 MG/3ML) 0.083% nebulizer solution Take 3 mLs (2.5 mg total) by nebulization every 6 (six) hours as needed for wheezing or shortness of breath. 04/29/19   Magdalen Spatz, NP  albuterol (VENTOLIN HFA) 108 (90 Base) MCG/ACT inhaler Inhale 2 puffs into the lungs every 6 (six) hours as needed for wheezing or shortness of breath. 10/09/19   Collene Gobble, MD  amLODipine (NORVASC) 10 MG tablet Take 1 tablet (10 mg total) by mouth daily. 09/08/19   [name], DO  Asenapine Maleate 10 MG SUBL Place 1 tablet (10 mg total) under the tongue in the morning, at noon, and at bedtime. 10/06/19 01/04/20  Pucilowski, Marchia Bond, MD  atorvastatin (LIPITOR) 80 MG tablet Take 1 tablet (80 mg total) by mouth daily. 12/10/18   Maximiano Coss, NP  colchicine 0.6 MG tablet Take 1 tablet (0.6 mg total) by mouth 2 (two) times daily. Patient taking differently: Take 1.2 mg by mouth 2 (two) times daily.  09/08/19   [name], DO  diazepam (VALIUM) 10 MG tablet Take 1 tablet (10 mg total) by mouth 3 (three) times daily. 10/06/19 01/04/20  Pucilowski, Marchia Bond, MD  diclofenac Sodium (VOLTAREN) 1 % GEL Apply 2 g topically 4 (four) times daily. 09/10/19   [name], DO  escitalopram (LEXAPRO) 20 MG tablet Take 2 tablets (40 mg total) by mouth daily. 08/29/19 11/27/19  Pucilowski, Marchia Bond, MD  febuxostat (ULORIC) 40 MG tablet  Take 1 tablet (40 mg total) by mouth daily. 09/11/19   [name], DO  ferrous sulfate 325 (65 FE) MG tablet Take 1 tablet (325 mg total) by mouth 2 (two) times daily with a meal. 09/08/19   [name], DO  Fluticasone-Umeclidin-Vilant (TRELEGY ELLIPTA) 100-62.5-25 MCG/INH AEPB Inhale 1 puff into the lungs daily. 10/09/19   Collene Gobble, MD  gabapentin (NEURONTIN) 600 MG tablet Take 1 tablet (600 mg total) by mouth 2 (two) times daily. 08/04/19   Pucilowski, Marchia Bond, MD  lisdexamfetamine (VYVANSE) 70 MG capsule Take 1 capsule (70 mg total) by mouth daily before breakfast. 10/06/19 11/05/19  Pucilowski, Marchia Bond, MD  metolazone (ZAROXOLYN) 2.5 MG tablet Take 1 tablet (2.5 mg total) by mouth in the morning, at noon, and at bedtime. 09/08/19   [name], DO  montelukast (SINGULAIR) 10 MG tablet Take 10 mg by mouth at bedtime. 03/10/19   [provider]  Multiple Vitamin (MULTIVITAMIN WITH MINERALS) TABS tablet Take 1 tablet by mouth daily.  [provider]  omeprazole (PRILOSEC) 40 MG capsule Take 1 capsule (40 mg total) by mouth 2 (two) times daily. 09/08/19   [name], DO  oxybutynin (DITROPAN-XL) 10 MG 24 hr tablet Take 1 tablet (10 mg total) by mouth at bedtime. 09/08/19   [name], DO  potassium chloride SA (KLOR-CON) 20 MEQ tablet Take 2 tablets (40 mEq total) by mouth 2 (two) times daily. 05/26/19   [name], DO  prazosin (MINIPRESS) 5 MG capsule Take 1 capsule (5 mg total) by mouth 2 (two) times daily. 10/06/19 01/04/20  Pucilowski, Marchia Bond, MD  traZODone (DESYREL) 100 MG tablet Take 1 tablet (100 mg total) by mouth at bedtime as needed for sleep. 10/06/19 01/04/20  Pucilowski, Marchia Bond, MD  budesonide-formoterol (SYMBICORT) 80-4.5 MCG/ACT inhaler Inhale 2 puffs into the lungs 2 (two) times daily. Patient not taking: Reported on 09/29/2019 11/15/18 09/29/19  Maximiano Coss, NP      Labs:  Results for orders placed or performed during the hospital encounter of 10/27/19 (from the past 48 hour(s))  CBC with Differential     Status: Abnormal   Collection Time: 10/27/19  3:58 AM  Result Value Ref Range   WBC 8.5 4.0 - 10.5 K/uL   RBC 4.72 3.87 - 5.11 MIL/uL   Hemoglobin 13.3 12.0 - 15.0 g/dL   HCT 43.1 36 - 46 %   MCV 91.3 80.0 - 100.0 fL   MCH 28.2 26.0 - 34.0 pg   MCHC 30.9 30.0 - 36.0 g/dL   RDW 16.5 (H) 11.5 - 15.5 %   Platelets 310 150 - 400 K/uL   nRBC 0.4 (H) 0.0 - 0.2 %   Neutrophils Relative % 70 %   Neutro Abs 5.9 1.7 - 7.7 K/uL   Lymphocytes Relative 7 %   Lymphs Abs 0.6 (L) 0.7 - 4.0 K/uL   Monocytes Relative 13 %   Monocytes Absolute 1.1 (H) 0.1 - 1.0 K/uL   Eosinophils Relative 0 %   Eosinophils Absolute 0.0 0 - 0 K/uL   Basophils Relative 0 %   Basophils Absolute 0.0 0 - 0 K/uL   WBC Morphology MILD LEFT SHIFT (1-5% METAS, OCC MYELO, OCC BANDS)    Immature Granulocytes 10 %   Abs Immature Granulocytes 0.83 (H) 0.00 - 0.07 K/uL    Comment: Performed at [facility], [place] [address] [place], [place] [address]  Comprehensive metabolic panel     Status: Abnormal   Collection Time: 10/27/19  3:58 AM  Result Value Ref Range   Sodium 144 135 - 145 mmol/L   Potassium 6.8 (HH) 3.5 - 5.1 mmol/L    Comment: CRITICAL RESULT CALLED TO, READ BACK BY AND VERIFIED WITH: MALLARD E. 10.11.2021 @ 0443 BY [place]. HEMOLYSIS MAY AFFECT INTEGRITY OF RESULTS.    Chloride 116 (H) 98 - 111 mmol/L   CO2 14 (L) 22 - 32 mmol/L   Glucose, Bld 89 70 - 99 mg/dL    Comment: Glucose reference range applies only to samples taken after fasting for at least 8 hours.   BUN 110 (H) 6 - 20 mg/dL    Comment: RESULTS CONFIRMED BY MANUAL DILUTION   Creatinine, Ser 3.95 (H) 0.44 - 1.00 mg/dL   Calcium 9.2 8.9 - 10.3 mg/dL   Total Protein 6.8 6.5 - 8.1 g/dL   Albumin 3.1 (L) 3.5 - 5.0 g/dL   AST 40 15 - 41 U/L   ALT 31 0 -  44 U/L    Alkaline Phosphatase 175 (H) 38 - 126 U/L   Total Bilirubin 1.4 (H) 0.3 - 1.2 mg/dL   GFR, Estimated 12 (L) >60 mL/min   Anion gap 14 5 - 15    Comment: Performed at [facility], [place] [address], [place], [place] [address]  Urinalysis, Routine w reflex microscopic Urine, Catheterized     Status: Abnormal   Collection Time: 10/27/19  3:58 AM  Result Value Ref Range   Color, Urine YELLOW YELLOW   APPearance CLEAR CLEAR   Specific Gravity, Urine 1.014 1.005 - 1.030   pH 5.0 5.0 - 8.0   Glucose, UA NEGATIVE NEGATIVE mg/dL   Hgb urine dipstick MODERATE (A) NEGATIVE   Bilirubin Urine NEGATIVE NEGATIVE   Ketones, ur 5 (A) NEGATIVE mg/dL   Protein, ur NEGATIVE NEGATIVE mg/dL   Nitrite NEGATIVE NEGATIVE   Leukocytes,Ua NEGATIVE NEGATIVE   RBC / HPF 0-5 0 - 5 RBC/hpf   WBC, UA 0-5 0 - 5 WBC/hpf   Bacteria, UA RARE (A) NONE SEEN   Mucus PRESENT     Comment: Performed at [facility], [place] [address] [place], [place] [address]  Ammonia     Status: None   Collection Time: 10/27/19  3:58 AM  Result Value Ref Range   Ammonia 16 9 - 35 umol/L    Comment: Performed at [facility], [place] [address], [place], [place] [address]  Lactic acid, plasma     Status: None   Collection Time: 10/27/19  3:58 AM  Result Value Ref Range   Lactic Acid, Venous 1.2 0.5 - 1.9 mmol/L    Comment: Performed at [facility], [place] [address], [place], [place] [address]  Respiratory Panel by RT PCR (Flu A&B, Covid) - Nasopharyngeal Swab     Status: None   Collection Time: 10/27/19  3:58 AM   Specimen: Nasopharyngeal Swab  Result Value Ref Range   SARS Coronavirus 2 by RT PCR NEGATIVE NEGATIVE    Comment: (NOTE) SARS-CoV-2 target nucleic acids are NOT DETECTED.  The SARS-CoV-2 RNA is generally detectable in upper respiratoy specimens during the acute phase of infection. The lowest concentration of SARS-CoV-2 viral copies this assay can  detect is 131 copies/mL. A negative result does not preclude SARS-Cov-2 infection and should not be used as the sole basis for treatment or other patient management decisions. A negative result may occur with  improper specimen collection/handling, submission of specimen other than nasopharyngeal swab, presence of viral mutation(s) within the areas targeted by this assay, and inadequate number of viral copies (<131 copies/mL). A negative result must be combined with clinical observations, patient history, and epidemiological information. The expected result is Negative.  Fact Sheet for Patients:  PinkCheek.be  Fact Sheet for Healthcare Providers:  GravelBags.it  This test is no t yet approved or cleared by the [place] FDA and  has been authorized for detection and/or diagnosis of SARS-CoV-2 by FDA under an Emergency Use Authorization (EUA). This EUA will remain  in effect (meaning this test can be used) for the duration of the COVID-19 declaration under Section 564(b)(1) of the Act, 21 U.S.C. section 360bbb-3(b)(1), unless the authorization is terminated or revoked sooner.     Influenza A by PCR NEGATIVE NEGATIVE   Influenza B by PCR NEGATIVE NEGATIVE    Comment: (NOTE) The Xpert Xpress SARS-CoV-2/FLU/RSV assay is intended as an aid in  the diagnosis of influenza from Nasopharyngeal swab specimens  and  should not be used as a sole basis for treatment. Nasal washings and  aspirates are unacceptable for Xpert Xpress SARS-CoV-2/FLU/RSV  testing.  Fact Sheet for Patients: PinkCheek.be  Fact Sheet for Healthcare Providers: GravelBags.it  This test is not yet approved or cleared by the [place] FDA and  has been authorized for detection and/or diagnosis of SARS-CoV-2 by  FDA under an Emergency Use Authorization (EUA). This EUA will remain  in effect  (meaning this test can be used) for the duration of the  Covid-19 declaration under Section 564(b)(1) of the Act, 21  U.S.C. section 360bbb-3(b)(1), unless the authorization is  terminated or revoked. Performed at [facility], [place] [address] [place], [place] [address]   Ethanol     Status: None   Collection Time: 10/27/19  3:58 AM  Result Value Ref Range   Alcohol, Ethyl (B) <10 <10 mg/dL    Comment: (NOTE) Lowest detectable limit for serum alcohol is 10 mg/dL.  For medical purposes only. Performed at [facility], [place] [address] [place], [place] [address]   Rapid urine drug screen (hospital performed)     Status: Abnormal   Collection Time: 10/27/19  3:58 AM  Result Value Ref Range   Opiates NONE DETECTED NONE DETECTED   Cocaine NONE DETECTED NONE DETECTED   Benzodiazepines POSITIVE (A) NONE DETECTED   Amphetamines NONE DETECTED NONE DETECTED   Tetrahydrocannabinol NONE DETECTED NONE DETECTED   Barbiturates NONE DETECTED NONE DETECTED    Comment: (NOTE) DRUG SCREEN FOR MEDICAL PURPOSES ONLY.  IF CONFIRMATION IS NEEDED FOR ANY PURPOSE, NOTIFY LAB WITHIN 5 DAYS.  LOWEST DETECTABLE LIMITS FOR URINE DRUG SCREEN Drug Class                     Cutoff (ng/mL) Amphetamine and metabolites    1000 Barbiturate and metabolites    200 Benzodiazepine                 182 Tricyclics and metabolites     300 Opiates and metabolites        300 Cocaine and metabolites        300 THC                            50 Performed at [facility], [place] [address], [place], [place] [address]   Acetaminophen level     Status: Abnormal   Collection Time: 10/27/19  3:58 AM  Result Value Ref Range   Acetaminophen (Tylenol), Serum <10 (L) 10 - 30 ug/mL    Comment: (NOTE) Therapeutic concentrations vary significantly. A range of 10-30 ug/mL  may be an effective concentration for many patients. However, some  are best treated at  concentrations outside of this range. Acetaminophen concentrations >150 ug/mL at 4 hours after ingestion  and >50 ug/mL at 12 hours after ingestion are often associated with  toxic reactions.  Performed at [facility], [place] [address], [place], [place] [address]   Salicylate level     Status: Abnormal   Collection Time: 10/27/19  3:58 AM  Result Value Ref Range   Salicylate Lvl <9.3 (L) 7.0 - 30.0 mg/dL    Comment: Performed at [facility], [place] [address], [place], [place] [address]  Blood gas, arterial (at [facility])     Status: Abnormal   Collection Time: 10/27/19  4:00 AM  Result  Value Ref Range   pH, Arterial 7.273 (L) 7.35 - 7.45   pCO2 arterial 29.8 (L) 32 - 48 mmHg   pO2, Arterial 98.6 83 - 108 mmHg   Bicarbonate 13.3 (L) 20.0 - 28.0 mmol/L   Acid-base deficit 12.1 (H) 0.0 - 2.0 mmol/L   O2 Saturation 96.3 %   Patient temperature 97.6    Collection site RIGHT RADIAL     Comment: Performed at [facility], [place] [address], [place], [place] [address]  CBG monitoring, ED     Status: None   Collection Time: 10/27/19  4:04 AM  Result Value Ref Range   Glucose-Capillary 88 70 - 99 mg/dL    Comment: Glucose reference range applies only to samples taken after fasting for at least 8 hours.  I-stat chem 8, ED (not at [facility] or [facility])     Status: Abnormal   Collection Time: 10/27/19  5:23 AM  Result Value Ref Range   Sodium 145 135 - 145 mmol/L   Potassium 4.8 3.5 - 5.1 mmol/L   Chloride 120 (H) 98 - 111 mmol/L   BUN 92 (H) 6 - 20 mg/dL   Creatinine, Ser 3.90 (H) 0.44 - 1.00 mg/dL   Glucose, Bld 86 70 - 99 mg/dL    Comment: Glucose reference range applies only to samples taken after fasting for at least 8 hours.   Calcium, Ion 1.25 1.15 - 1.40 mmol/L   TCO2 17 (L) 22 - 32 mmol/L   Hemoglobin 14.3 12.0 - 15.0 g/dL   HCT 42.0 36 - 46 %   *Note: Due to a large number of results and/or encounters for the requested time period,  some results have not been displayed. A complete set of results can be found in Results Review.     ROS:  General: Weak fatigued Eyes no visual complaints Ears nose mouth throat no hearing loss epistaxis sore throat Cardiovascular no angina orthopnea PND Respiratory no cough wheeze hemoptysis Abdominal no abdominal pain nausea vomiting or diarrhea Urogenital no urgency frequency dysuria Neurologic history of closed brain injury Baseline unknown multiple sedative medications on board\ Dermatologic no skin rash or itching  Physical Exam: Vitals:   10/27/19 0635 10/27/19 0841  BP: (!) 151/85 (!) 144/85  Pulse: 72 71  Resp: 20 17  Temp:  97.8 F (36.6 C)  SpO2: 99% 97%     General: Lethargic and not following commands HEENT: Atraumatic external appearance normal Eyes: Pupils round equal reactive Neck: Neck was supple JVP flat Heart: Regular rate and rhythm no murmurs rubs or [place] Lungs: Clear to auscultation no wheeze or rales Abdomen: Soft nontender bowel sounds present Extremities: No cyanosis clubbing or edema Skin: Appears to be sunken with poor turgor Neuro: Alert but not responsive to commands  Assessment/Plan: 1.Acute on chronic kidney disease.  Baseline serum creatinine is about 1.5 mg/dL.  Would continue to avoid nephrotoxins nonsteroidal anti-inflammatory drugs ACE inhibitor's or ARB's.  Blood pressure appears to be adequate.  There is no evidence of any hypotension.  Will check a CPK renal ultrasound urinalysis did not show any activity.  Will check urine sodium appears that she is dehydrated.  Will agree with IV fluid replacement.  Avoid IV contrast or intra-arterial procedures 2. Hypertension/volume  -appears volume depleted.  I believe she has had poor p.o. intake.  I think we should hydrate gently 3.  Metabolic acidosis IV sodium bicarbonate 150 mEq in 1 L of D5W at 100 cc an hour 4.  Hyperkalemia resolved possibly some hemolysis 5.  Reported altered mental  status patient with head injury with again agree with holding neuro sedative medications.  I do not believe that she is uremic although it is very difficult to say at this point.  Hopefully her renal function should improve with hydration.  I see no reason for her to be transferred to [facility] for initiation of hemodialysis at this point.  Sherril Croon 10/27/2019, 8:56 AM

## 2019-10-27 NOTE — Progress Notes (Signed)
Attempted to do nursing admission history. Pt unable to answer. No family contact numbers on file. Unable to complete nursing history at this time. Obtained available information off of her chart. Lucius Conn BSN, RN-BC Admissions RN 10/27/2019 3:12 PM

## 2019-10-27 NOTE — ED Notes (Signed)
Nursing staff has tried multiple times to obtain family contact information from pt. Each time, pt has denied having any family or anyone staff can call to update.

## 2019-10-27 NOTE — ED Notes (Signed)
Date and time results received: 10/27/19 0444 (use smartphrase ".now" to insert current time)  Test: k+ Critical Value: 6.8  Name of Provider Notified: Wickline  Orders Received? Or Actions Taken?: Actions Taken: provider notified and redraw

## 2019-10-28 DIAGNOSIS — L8990 Pressure ulcer of unspecified site, unspecified stage: Secondary | ICD-10-CM | POA: Insufficient documentation

## 2019-10-28 DIAGNOSIS — L98429 Non-pressure chronic ulcer of back with unspecified severity: Secondary | ICD-10-CM | POA: Insufficient documentation

## 2019-10-28 DIAGNOSIS — N179 Acute kidney failure, unspecified: Secondary | ICD-10-CM | POA: Diagnosis not present

## 2019-10-28 LAB — RENAL FUNCTION PANEL
Albumin: 2.6 g/dL — ABNORMAL LOW (ref 3.5–5.0)
Anion gap: 14 (ref 5–15)
BUN: 81 mg/dL — ABNORMAL HIGH (ref 6–20)
CO2: 25 mmol/L (ref 22–32)
Calcium: 8.8 mg/dL — ABNORMAL LOW (ref 8.9–10.3)
Chloride: 111 mmol/L (ref 98–111)
Creatinine, Ser: 2.19 mg/dL — ABNORMAL HIGH (ref 0.44–1.00)
GFR, Estimated: 24 mL/min — ABNORMAL LOW (ref >60)
Glucose, Bld: 123 mg/dL — ABNORMAL HIGH (ref 70–99)
Phosphorus: 2 mg/dL — ABNORMAL LOW (ref 2.5–4.6)
Potassium: 3.6 mmol/L (ref 3.5–5.1)
Sodium: 150 mmol/L — ABNORMAL HIGH (ref 135–145)

## 2019-10-28 LAB — C DIFFICILE QUICK SCREEN W PCR REFLEX
C Diff antigen: POSITIVE — AB
C Diff toxin: NEGATIVE

## 2019-10-28 LAB — CBC
HCT: 35.9 % — ABNORMAL LOW (ref 36.0–46.0)
Hemoglobin: 11.6 g/dL — ABNORMAL LOW (ref 12.0–15.0)
MCH: 27.9 pg (ref 26.0–34.0)
MCHC: 32.3 g/dL (ref 30.0–36.0)
MCV: 86.3 fL (ref 80.0–100.0)
Platelets: 270 10*3/uL (ref 150–400)
RBC: 4.16 MIL/uL (ref 3.87–5.11)
RDW: 15.9 % — ABNORMAL HIGH (ref 11.5–15.5)
WBC: 9.2 10*3/uL (ref 4.0–10.5)
nRBC: 0.3 % — ABNORMAL HIGH (ref 0.0–0.2)

## 2019-10-28 LAB — URINE CULTURE: Culture: NO GROWTH

## 2019-10-28 LAB — COMPREHENSIVE METABOLIC PANEL
ALT: 30 U/L (ref 0–44)
AST: 29 U/L (ref 15–41)
Albumin: 2.6 g/dL — ABNORMAL LOW (ref 3.5–5.0)
Alkaline Phosphatase: 127 U/L — ABNORMAL HIGH (ref 38–126)
Anion gap: 12 (ref 5–15)
BUN: 79 mg/dL — ABNORMAL HIGH (ref 6–20)
CO2: 25 mmol/L (ref 22–32)
Calcium: 8.7 mg/dL — ABNORMAL LOW (ref 8.9–10.3)
Chloride: 110 mmol/L (ref 98–111)
Creatinine, Ser: 2.26 mg/dL — ABNORMAL HIGH (ref 0.44–1.00)
GFR, Estimated: 23 mL/min — ABNORMAL LOW (ref >60)
Glucose, Bld: 123 mg/dL — ABNORMAL HIGH (ref 70–99)
Potassium: 3.4 mmol/L — ABNORMAL LOW (ref 3.5–5.1)
Sodium: 147 mmol/L — ABNORMAL HIGH (ref 135–145)
Total Bilirubin: 0.6 mg/dL (ref 0.3–1.2)
Total Protein: 5.7 g/dL — ABNORMAL LOW (ref 6.5–8.1)

## 2019-10-28 LAB — CLOSTRIDIUM DIFFICILE BY PCR, REFLEXED: Toxigenic C. Difficile by PCR: NEGATIVE

## 2019-10-28 LAB — GLUCOSE, CAPILLARY
Glucose-Capillary: 100 mg/dL — ABNORMAL HIGH (ref 70–99)
Glucose-Capillary: 109 mg/dL — ABNORMAL HIGH (ref 70–99)
Glucose-Capillary: 115 mg/dL — ABNORMAL HIGH (ref 70–99)
Glucose-Capillary: 128 mg/dL — ABNORMAL HIGH (ref 70–99)
Glucose-Capillary: 141 mg/dL — ABNORMAL HIGH (ref 70–99)

## 2019-10-28 MED ORDER — FERROUS SULFATE 325 (65 FE) MG PO TABS
325.0000 mg | ORAL_TABLET | Freq: Two times a day (BID) | ORAL | Status: DC
  Administered 2019-10-28 – 2019-11-02 (×11): 325 mg via ORAL
  Filled 2019-10-28 (×11): qty 1

## 2019-10-28 MED ORDER — FLUTICASONE-UMECLIDIN-VILANT 100-62.5-25 MCG/INH IN AEPB: 1.0000 | INHALATION_SPRAY | Freq: Every day | RESPIRATORY_TRACT | Status: DC

## 2019-10-28 MED ORDER — ALBUTEROL SULFATE (2.5 MG/3ML) 0.083% IN NEBU: 2.5000 mg | INHALATION_SOLUTION | Freq: Four times a day (QID) | RESPIRATORY_TRACT | Status: DC | PRN

## 2019-10-28 MED ORDER — SODIUM PHOSPHATES 45 MMOLE/15ML IV SOLN: 20.0000 mmol | Freq: Once | INTRAVENOUS | Status: DC

## 2019-10-28 MED ORDER — FLUTICASONE FUROATE-VILANTEROL 100-25 MCG/INH IN AEPB
1.0000 | INHALATION_SPRAY | Freq: Every day | RESPIRATORY_TRACT | Status: DC
  Administered 2019-10-29 – 2019-11-02 (×3): 1 via RESPIRATORY_TRACT
  Filled 2019-10-28: qty 28

## 2019-10-28 MED ORDER — UMECLIDINIUM BROMIDE 62.5 MCG/INH IN AEPB
1.0000 | INHALATION_SPRAY | Freq: Every day | RESPIRATORY_TRACT | Status: DC
  Administered 2019-10-30 – 2019-11-01 (×2): 1 via RESPIRATORY_TRACT
  Filled 2019-10-28: qty 7

## 2019-10-28 MED ORDER — DEXTROSE 5 % IV SOLN: INTRAVENOUS | Status: DC

## 2019-10-28 MED ORDER — POTASSIUM PHOSPHATES 15 MMOLE/5ML IV SOLN
20.0000 mmol | Freq: Once | INTRAVENOUS | Status: AC
  Administered 2019-10-28: 20 mmol via INTRAVENOUS
  Filled 2019-10-28: qty 6.67

## 2019-10-28 NOTE — Progress Notes (Signed)
[facility] ROUNDING NOTE   Subjective:   Brief history this 59 year old lady with a baseline serum creatinine 1.5 to 1.8 mg/dL.  She has a history of hypertension diabetes and COPD.  She presents with altered mental status.  She has a history of hypoxic brain injury with diagnosis schizoaffective to hold and uses multiple neuro sedative medications.  Urine output 850 cc 10/27/2019  Blood pressure 129/77 pulse 55 temperature 98.1 O2 sats 100% on room air  Sodium 147 potassium 3.4 chloride 110 CO2 25 BUN 79 creatinine 2.26 calcium 8.7 phosphorus 2 albumin 2.6.  Hemoglobin 11.6  Amlodipine 10 mg daily Singulair 10 mg daily Ditropan 10 mg nightly Minipress 5 mg twice daily  Objective:  Vital signs in last 24 hours:  Temp:  [97.8 F (36.6 C)-98.2 F (36.8 C)] 98.1 F (36.7 C) (10/12 0555) Pulse Rate:  [50-83] 50 (10/12 0555) Resp:  [12-18] 16 (10/12 0555) BP: (123-154)/(76-118) 129/77 (10/12 0555) SpO2:  [95 %-100 %] 100 % (10/12 0555)  Weight change:  There were no vitals filed for this visit.  Intake/Output: I/O last 3 completed shifts: In: 1252.4 [I.V.:1252.4] Out: 3875 [Urine:1650]   Intake/Output this shift:  No intake/output data recorded.  General: Lethargic and not following commands HEENT: Atraumatic external appearance normal Eyes: Pupils round equal reactive Neck: Neck was supple JVP flat Heart: Regular rate and rhythm no murmurs rubs or gallops Lungs: Clear to auscultation no wheeze or rales Abdomen: Soft nontender bowel sounds present Extremities: No cyanosis clubbing or edema Skin: Appears to be sunken with poor turgor Neuro: Alert but not responsive to commands   Basic Metabolic Panel: Recent Labs  Lab 10/27/19 0358 10/27/19 0519 10/27/19 0523 10/27/19 1041 10/28/19 0452  NA 144  --  145 147* 147*  150*  K 6.8*  --  4.8 4.2 3.4*  3.6  CL 116*  --  120* 115* 110  111  CO2 14*  --   --  16* 25  25  GLUCOSE 89  --  86 144* 123*   123*  BUN 110*  --  92* 99* 79*  81*  CREATININE 3.95* 4.10* 3.90* 3.27* 2.26*  2.19*  CALCIUM 9.2  --   --  9.0 8.7*  8.8*  PHOS  --   --   --  4.4 2.0*    Liver Function Tests: Recent Labs  Lab 10/27/19 0358 10/27/19 1041 10/28/19 0452  AST 40  --  29  ALT 31  --  30  ALKPHOS 175*  --  127*  BILITOT 1.4*  --  0.6  PROT 6.8  --  5.7*  ALBUMIN 3.1* 2.9* 2.6*  2.6*   No results for input(s): LIPASE, AMYLASE in the last 168 hours. Recent Labs  Lab 10/27/19 0358  AMMONIA 16    CBC: Recent Labs  Lab 10/27/19 0358 10/27/19 0523 10/27/19 1041 10/28/19 0452  WBC 8.5  --  7.4 9.2  NEUTROABS 5.9  --   --   --   HGB 13.3 14.3 12.1 11.6*  HCT 43.1 42.0 38.5 35.9*  MCV 91.3  --  89.1 86.3  PLT 310  --  304 270    Cardiac Enzymes: Recent Labs  Lab 10/27/19 0851  CKTOTAL 547*    BNP: Invalid input(s): POCBNP  CBG: Recent Labs  Lab 10/27/19 0404 10/27/19 1211 10/27/19 1806 10/28/19 0104 10/28/19 0602  GLUCAP 88 143* 125* 128* 109*    Microbiology: Results for orders placed or performed during the hospital  encounter of 10/27/19  Respiratory Panel by RT PCR (Flu A&B, Covid) - Nasopharyngeal Swab     Status: None   Collection Time: 10/27/19  3:58 AM   Specimen: Nasopharyngeal Swab  Result Value Ref Range Status   SARS Coronavirus 2 by RT PCR NEGATIVE NEGATIVE Final    Comment: (NOTE) SARS-CoV-2 target nucleic acids are NOT DETECTED.  The SARS-CoV-2 RNA is generally detectable in upper respiratoy specimens during the acute phase of infection. The lowest concentration of SARS-CoV-2 viral copies this assay can detect is 131 copies/mL. A negative result does not preclude SARS-Cov-2 infection and should not be used as the sole basis for treatment or other patient management decisions. A negative result may occur with  improper specimen collection/handling, submission of specimen other than nasopharyngeal swab, presence of viral mutation(s) within  the areas targeted by this assay, and inadequate number of viral copies (<131 copies/mL). A negative result must be combined with clinical observations, patient history, and epidemiological information. The expected result is Negative.  Fact Sheet for Patients:  PinkCheek.be  Fact Sheet for Healthcare Providers:  GravelBags.it  This test is no t yet approved or cleared by the [place] FDA and  has been authorized for detection and/or diagnosis of SARS-CoV-2 by FDA under an Emergency Use Authorization (EUA). This EUA will remain  in effect (meaning this test can be used) for the duration of the COVID-19 declaration under Section 564(b)(1) of the Act, 21 U.S.C. section 360bbb-3(b)(1), unless the authorization is terminated or revoked sooner.     Influenza A by PCR NEGATIVE NEGATIVE Final   Influenza B by PCR NEGATIVE NEGATIVE Final    Comment: (NOTE) The Xpert Xpress SARS-CoV-2/FLU/RSV assay is intended as an aid in  the diagnosis of influenza from Nasopharyngeal swab specimens and  should not be used as a sole basis for treatment. Nasal washings and  aspirates are unacceptable for Xpert Xpress SARS-CoV-2/FLU/RSV  testing.  Fact Sheet for Patients: PinkCheek.be  Fact Sheet for Healthcare Providers: GravelBags.it  This test is not yet approved or cleared by the [place] FDA and  has been authorized for detection and/or diagnosis of SARS-CoV-2 by  FDA under an Emergency Use Authorization (EUA). This EUA will remain  in effect (meaning this test can be used) for the duration of the  Covid-19 declaration under Section 564(b)(1) of the Act, 21  U.S.C. section 360bbb-3(b)(1), unless the authorization is  terminated or revoked. Performed at [facility], [place] [address], [place], [place] [address]    *Note: Due to a large number of results  and/or encounters for the requested time period, some results have not been displayed. A complete set of results can be found in Results Review.    Coagulation Studies: No results for input(s): LABPROT, INR in the last 72 hours.  Urinalysis: Recent Labs    10/27/19 0358  COLORURINE YELLOW  LABSPEC 1.014  PHURINE 5.0  GLUCOSEU NEGATIVE  HGBUR MODERATE*  BILIRUBINUR NEGATIVE  KETONESUR 5*  PROTEINUR NEGATIVE  NITRITE NEGATIVE  LEUKOCYTESUR NEGATIVE      Imaging: CT Head Wo Contrast  Result Date: 10/27/2019 CLINICAL DATA:  Delirium, confusion EXAM: CT HEAD WITHOUT CONTRAST TECHNIQUE: Contiguous axial images were obtained from the base of the skull through the vertex without intravenous contrast. COMPARISON:  None. FINDINGS: Brain: Normal anatomic configuration. No abnormal intra or extra-axial mass lesion or fluid collection. No abnormal mass effect or midline shift. No evidence of acute intracranial hemorrhage or infarct. Ventricular size is  normal. Cerebellum unremarkable. Vascular: Unremarkable Skull: Intact Sinuses/Orbits: Paranasal sinuses are clear. Orbits are unremarkable. Other: Mastoid air cells and middle ear cavities are clear. IMPRESSION: No acute intracranial hemorrhage or infarct. Electronically Signed   By: Fidela Salisbury MD   On: 10/27/2019 04:02   US RENAL  Result Date: 10/27/2019 CLINICAL DATA:  Acute renal failure. EXAM: RENAL / URINARY TRACT ULTRASOUND COMPLETE COMPARISON:  09/18/2016 FINDINGS: Right Kidney: Renal measurements: 9.7 x 4.8 x 4.5 cm = volume: 109 mL. Increased echogenicity. No mass or hydronephrosis visualized. Left Kidney: Renal measurements: 9.9 x 5.0 x 3.8 cm = volume: 100 mL. Increased echogenicity. No mass or hydronephrosis visualized. Bladder: Appears normal for degree of bladder distention. Other: A cystic mass anterior to the bladder measures 7.1 x 7.0 x 8.0 cm, not evident on the prior ultrasound. Multiple small stones were noted in the  gallbladder, not evaluated in detail on this renal ultrasound. IMPRESSION: 1. Echogenic kidneys compatible with medical renal disease. No hydronephrosis. 2. 8 cm cystic mass anterior to the bladder. Consider CT for further evaluation. 3. Cholelithiasis. Electronically Signed   By: Logan Bores M.D.   On: 10/27/2019 06:51   DG Chest Port 1 View  Result Date: 10/27/2019 CLINICAL DATA:  Altered mental status. EXAM: PORTABLE CHEST 1 VIEW COMPARISON:  03/30/2019 FINDINGS: Telemetry leads overlie the chest. The cardiomediastinal silhouette is unchanged with normal heart size. The lungs are hypoinflated with mild hazy opacity in the left mid lung. No sizable pleural effusion or pneumothorax is identified. No acute osseous abnormality is seen. IMPRESSION: Hypoinflation with mild hazy left mid lung opacity which may reflect atelectasis or pneumonia. Electronically Signed   By: Logan Bores M.D.   On: 10/27/2019 04:00     Medications:   . sodium bicarbonate (isotonic) 150 mEq in D5W 1000 mL infusion 100 mL/hr at 10/28/19 0535   . amLODipine  10 mg Oral Daily  . Chlorhexidine Gluconate Cloth  6 each Topical Daily  . heparin  5,000 Units Subcutaneous Q8H  . montelukast  10 mg Oral QHS  . oxybutynin  10 mg Oral QHS  . prazosin  5 mg Oral BID   ipratropium-albuterol, ondansetron **OR** ondansetron (ZOFRAN) IV  Assessment/ Plan:  1.Acute on chronic kidney disease.  Baseline serum creatinine is about 1.5 mg/dL.  Would continue to avoid nephrotoxins nonsteroidal anti-inflammatory drugs ACE inhibitor's or ARB's.  Blood pressure appears to be adequate.  There is no evidence of any hypotension.  CPK mildly increased.  Statins have been discontinued. 2. Hypertension/volume  -appears volume depleted.  I believe she has had poor p.o. intake.  I think we should hydrate gently.  We will switch IV fluids to dilute D5 water at 100 cc an hour 3.  Metabolic acidosis-resolved 4.  Hyperkalemia resolved 5.  Reported  altered mental status patient with head injury with again agree with holding neuro sedative medications.  6.  Hypernatremia.  With free water deficit will change to IV D5W at 100 cc an hour 7.  Hypophosphatemia will replete   LOS: 1 Sherril Croon @TODAY @7 :40 AM

## 2019-10-28 NOTE — Progress Notes (Signed)
PROGRESS NOTE    Tiffany [name]  ZOX:[id number] DOB: December 18, 1960 DOA: 10/27/2019 PCP: [name], DO   Chief Complaint  Patient presents with  . Altered Mental Status   Brief Narrative: 59 year old female with history of hypertension on amlodipine, hyperlipidemia, and anemia, CKDIIIb baseline creatinine 1.5-1.8 diabetes, COPD brought to the ED with lethargy/altered mental status.  On presentation patient unable to participate in the family available unable to contact so history is limited. Apparently she was found increasingly confused and weak, family became concerned and called EMS.  Had recent PCP visit 10/22/2019 for diarrhea "not feeling well", advised supportive care.  There has been no report of diarrhea as a consult for this visit.  In ED,Patient was found to have altered mental status minimally responding, work-up showed acute kidney injury, metabolic acidosis, hyperkalemia.  No acute significant finding. Nephrology was consulted.  Patient was admitted for further management.  Subjective:  Alert,awake oriented to place, self her dob. reports " I live with my mother and she called EMS as I could not get up and she could not get me up" Moving her UE/LE. gen weakness present Denies any nausea, vomiting abd pain, no diarrhea here Wants to drink eat. Bun/creat improving overnight,Potassium improved, BUN improving.  Albumin at 2.6  Assessment & Plan:  AKI on CKD IIIb, baseline creatinine 1.5-1.8: Appreciate nephrology input,avoid NSAID medication ACE or ARB.Continue on IV fluids, was on bicarb since the ED- changing to D5W.CK slightly elevated at 547.  Renal ultrasound 8 cm cystic mass ant to bladder-CT advised for evaluation defer to nephrology/or can obtain prior to D/C. US shows echogenic kidneys. Recent Labs  Lab 10/27/19 0358 10/27/19 0519 10/27/19 0523 10/27/19 1041 10/28/19 0452  BUN 110*  --  92* 99* 79*  81*  CREATININE 3.95* 4.10* 3.90* 3.27* 2.26*   8.11*    Metabolic acidosis:Treated with bicarb and improved.Change IV fluids today pre nephro Hyperkalemia:Due to #1.Resolved. Hypernatremia:Nephro on board change IV fluids today  Acute encephalopathy likely metabolic/uremic: seems to be resolved.  CT head No acute finding.  LFTs and ammonia are stable.  UA no evidence of UTI, Chest x-ray hypoinflation with mild hazy left midlung opacity may relate atelectasis or pneumonia but patient without fever or leukocytosis, procal altho up 6.2-low suspicion for infection.UDSpositive for benzo.  COVID-19 screen negative Tylenol level salicylate level undetectable.  TSH. Continue Lipitor  Hypertension: BP stable.  Continue prazosin, Norvasc hold metolazone  Prediabetes with hemoglobin A1c 5.6, monitor CBG  Neuropathy on Neurontin and holding for now.  COPD/chronic respiratory failure with hypoxia/hypercapnia/OSA: Followed by [facility], on 2 L nasal cannula baseline, also has been prescribed a trilogy ventilator usee every night.Cont same, cont nebs prn.  Elevated alk phos, previously elevated, monitor, GGT  79- repeat  Schizoaffective/bipolar disorder: Xarelto meds on hold due to sedation and QTC prolongation, pharmacy still working on to verify her home meds.  Gout- hold colchicine  BJY:NWGN code, consult palliative care.Unable to each family.  Nutrition: Diet Order            Diet NPO time specified Except for: Sips with Meds  Diet effective now                 Pressure Ulcer: Pressure Injury 10/27/19 Buttocks Right Stage 2 -  Partial thickness loss of dermis presenting as a shallow open injury with a red, pink wound bed without slough. (Active)  10/27/19 2101  Location: Buttocks  Location Orientation: Right  Staging: Stage 2 -  Partial thickness loss of dermis presenting as a shallow open injury with a red, pink wound bed without slough.  Wound Description (Comments):   Present on Admission: Yes   DVT prophylaxis: heparin  injection 5,000 Units Start: 10/27/19 0930 Code Status:   Code Status: Full Code Family Communication: plan of care discussed with patient at bedside.unable to contact family  Status is: Inpatient Remains inpatient appropriate because:IV treatments appropriate due to intensity of illness or inability to take PO and Inpatient level of care appropriate due to severity of illness  Dispo: The patient is from: Home              Anticipated d/c is to: Home              Anticipated d/c date is: 2 days              Patient currently is not medically stable to d/c.  Consultants:nephrology Procedures:see note  Culture/Microbiology    Component Value Date/Time   SDES URINE, RANDOM 09/30/2019 1613   SPECREQUEST  09/30/2019 1613    NONE Performed at [facility], [place] [address], [place], [place] [address]    CULT MULTIPLE SPECIES PRESENT, SUGGEST RECOLLECTION (A) 09/30/2019 1613   REPTSTATUS 10/01/2019 FINAL 09/30/2019 1613    Other culture-see note  Medications: Scheduled Meds: . amLODipine  10 mg Oral Daily  . Chlorhexidine Gluconate Cloth  6 each Topical Daily  . heparin  5,000 Units Subcutaneous Q8H  . montelukast  10 mg Oral QHS  . oxybutynin  10 mg Oral QHS  . prazosin  5 mg Oral BID   Continuous Infusions: . sodium bicarbonate (isotonic) 150 mEq in D5W 1000 mL infusion 100 mL/hr at 10/28/19 0535    Antimicrobials: Anti-infectives (From admission, onward)   None     Objective: Vitals: Today's Vitals   10/27/19 1731 10/27/19 1900 10/27/19 2152 10/28/19 0555  BP: (!) 131/91 (!) 123/107 138/76 129/77  Pulse: 72 65 (!) 56 (!) 50  Resp: '14  16 16  ' Temp: 97.8 F (36.6 C) 98.2 F (36.8 C) 97.9 F (36.6 C) 98.1 F (36.7 C)  TempSrc: Oral Oral Oral Oral  SpO2: 96% 97% 100% 100%  PainSc:        Intake/Output Summary (Last 24 hours) at 10/28/2019 0744 Last data filed at 10/28/2019 0535 Gross per 24 hour  Intake 1252.41 ml  Output 1650 ml  Net -397.59 ml    There were no vitals filed for this visit. Weight change:   Intake/Output from previous day: 10/11 0701 - 10/12 0700 In: 1252.4 [I.V.:1252.4] Out: 1443 [Urine:1650] Intake/Output this shift: No intake/output data recorded.  Examination: General exam: AAOx2-3 ,NAD, weak appearing. HEENT:Oral mucosa moist, Ear/Nose WNL grossly,dentition normal. Oral mucose DRY Respiratory system: bilaterally clear,no wheezing or crackles,no use of accessory muscle, non tender. Cardiovascular system: S1 & S2 +, regular, No JVD. Gastrointestinal system: Abdomen soft, NT,ND, BS+. Nervous System:Alert, awake, moving extremities and grossly nonfocal Extremities: No edema, distal peripheral pulses palpable.  Skin: No rashes,no icterus. MSK: Normal muscle bulk,tone, power  Data Reviewed: I have personally reviewed following labs and imaging studies CBC: Recent Labs  Lab 10/27/19 0358 10/27/19 0523 10/27/19 1041 10/28/19 0452  WBC 8.5  --  7.4 9.2  NEUTROABS 5.9  --   --   --   HGB 13.3 14.3 12.1 11.6*  HCT 43.1 42.0 38.5 35.9*  MCV 91.3  --  89.1 86.3  PLT 310  --  304 270  Basic Metabolic Panel: Recent Labs  Lab 10/27/19 0358 10/27/19 0519 10/27/19 0523 10/27/19 1041 10/28/19 0452  NA 144  --  145 147* 147*  150*  K 6.8*  --  4.8 4.2 3.4*  3.6  CL 116*  --  120* 115* 110  111  CO2 14*  --   --  16* 25  25  GLUCOSE 89  --  86 144* 123*  123*  BUN 110*  --  92* 99* 79*  81*  CREATININE 3.95* 4.10* 3.90* 3.27* 2.26*  2.19*  CALCIUM 9.2  --   --  9.0 8.7*  8.8*  PHOS  --   --   --  4.4 2.0*   GFR: CrCl cannot be calculated (Unknown ideal weight.). Liver Function Tests: Recent Labs  Lab 10/27/19 0358 10/27/19 1041 10/28/19 0452  AST 40  --  29  ALT 31  --  30  ALKPHOS 175*  --  127*  BILITOT 1.4*  --  0.6  PROT 6.8  --  5.7*  ALBUMIN 3.1* 2.9* 2.6*  2.6*   No results for input(s): LIPASE, AMYLASE in the last 168 hours. Recent Labs  Lab 10/27/19 0358  AMMONIA 16    Coagulation Profile: No results for input(s): INR, PROTIME in the last 168 hours. Cardiac Enzymes: Recent Labs  Lab 10/27/19 0851  CKTOTAL 547*   BNP (last 3 results) No results for input(s): PROBNP in the last 8760 hours. HbA1C: No results for input(s): HGBA1C in the last 72 hours. CBG: Recent Labs  Lab 10/27/19 0404 10/27/19 1211 10/27/19 1806 10/28/19 0104 10/28/19 0602  GLUCAP 88 143* 125* 128* 109*   Lipid Profile: No results for input(s): CHOL, HDL, LDLCALC, TRIG, CHOLHDL, LDLDIRECT in the last 72 hours. Thyroid Function Tests: Recent Labs    10/27/19 0358  TSH 1.233   Anemia Panel: No results for input(s): VITAMINB12, FOLATE, FERRITIN, TIBC, IRON, RETICCTPCT in the last 72 hours. Sepsis Labs: Recent Labs  Lab 10/27/19 0358 10/27/19 0851  PROCALCITON  --  6.29  LATICACIDVEN 1.2  --     Recent Results (from the past 240 hour(s))  Respiratory Panel by RT PCR (Flu A&B, Covid) - Nasopharyngeal Swab     Status: None   Collection Time: 10/27/19  3:58 AM   Specimen: Nasopharyngeal Swab  Result Value Ref Range Status   SARS Coronavirus 2 by RT PCR NEGATIVE NEGATIVE Final    Comment: (NOTE) SARS-CoV-2 target nucleic acids are NOT DETECTED.  The SARS-CoV-2 RNA is generally detectable in upper respiratoy specimens during the acute phase of infection. The lowest concentration of SARS-CoV-2 viral copies this assay can detect is 131 copies/mL. A negative result does not preclude SARS-Cov-2 infection and should not be used as the sole basis for treatment or other patient management decisions. A negative result may occur with  improper specimen collection/handling, submission of specimen other than nasopharyngeal swab, presence of viral mutation(s) within the areas targeted by this assay, and inadequate number of viral copies (<131 copies/mL). A negative result must be combined with clinical observations, patient history, and epidemiological information.  The expected result is Negative.  Fact Sheet for Patients:  PinkCheek.be  Fact Sheet for Healthcare Providers:  GravelBags.it  This test is no t yet approved or cleared by the [place] FDA and  has been authorized for detection and/or diagnosis of SARS-CoV-2 by FDA under an Emergency Use Authorization (EUA). This EUA will remain  in effect (meaning this test can be used) for  the duration of the COVID-19 declaration under Section 564(b)(1) of the Act, 21 U.S.C. section 360bbb-3(b)(1), unless the authorization is terminated or revoked sooner.     Influenza A by PCR NEGATIVE NEGATIVE Final   Influenza B by PCR NEGATIVE NEGATIVE Final    Comment: (NOTE) The Xpert Xpress SARS-CoV-2/FLU/RSV assay is intended as an aid in  the diagnosis of influenza from Nasopharyngeal swab specimens and  should not be used as a sole basis for treatment. Nasal washings and  aspirates are unacceptable for Xpert Xpress SARS-CoV-2/FLU/RSV  testing.  Fact Sheet for Patients: PinkCheek.be  Fact Sheet for Healthcare Providers: GravelBags.it  This test is not yet approved or cleared by the [place] FDA and  has been authorized for detection and/or diagnosis of SARS-CoV-2 by  FDA under an Emergency Use Authorization (EUA). This EUA will remain  in effect (meaning this test can be used) for the duration of the  Covid-19 declaration under Section 564(b)(1) of the Act, 21  U.S.C. section 360bbb-3(b)(1), unless the authorization is  terminated or revoked. Performed at [facility], [place] [address], [place], [place] [address]      Radiology Studies: CT Head Wo Contrast  Result Date: 10/27/2019 CLINICAL DATA:  Delirium, confusion EXAM: CT HEAD WITHOUT CONTRAST TECHNIQUE: Contiguous axial images were obtained from the base of the skull through the vertex without  intravenous contrast. COMPARISON:  None. FINDINGS: Brain: Normal anatomic configuration. No abnormal intra or extra-axial mass lesion or fluid collection. No abnormal mass effect or midline shift. No evidence of acute intracranial hemorrhage or infarct. Ventricular size is normal. Cerebellum unremarkable. Vascular: Unremarkable Skull: Intact Sinuses/Orbits: Paranasal sinuses are clear. Orbits are unremarkable. Other: Mastoid air cells and middle ear cavities are clear. IMPRESSION: No acute intracranial hemorrhage or infarct. Electronically Signed   By: Fidela Salisbury MD   On: 10/27/2019 04:02   US RENAL  Result Date: 10/27/2019 CLINICAL DATA:  Acute renal failure. EXAM: RENAL / URINARY TRACT ULTRASOUND COMPLETE COMPARISON:  09/18/2016 FINDINGS: Right Kidney: Renal measurements: 9.7 x 4.8 x 4.5 cm = volume: 109 mL. Increased echogenicity. No mass or hydronephrosis visualized. Left Kidney: Renal measurements: 9.9 x 5.0 x 3.8 cm = volume: 100 mL. Increased echogenicity. No mass or hydronephrosis visualized. Bladder: Appears normal for degree of bladder distention. Other: A cystic mass anterior to the bladder measures 7.1 x 7.0 x 8.0 cm, not evident on the prior ultrasound. Multiple small stones were noted in the gallbladder, not evaluated in detail on this renal ultrasound. IMPRESSION: 1. Echogenic kidneys compatible with medical renal disease. No hydronephrosis. 2. 8 cm cystic mass anterior to the bladder. Consider CT for further evaluation. 3. Cholelithiasis. Electronically Signed   By: Logan Bores M.D.   On: 10/27/2019 06:51   DG Chest Port 1 View  Result Date: 10/27/2019 CLINICAL DATA:  Altered mental status. EXAM: PORTABLE CHEST 1 VIEW COMPARISON:  03/30/2019 FINDINGS: Telemetry leads overlie the chest. The cardiomediastinal silhouette is unchanged with normal heart size. The lungs are hypoinflated with mild hazy opacity in the left mid lung. No sizable pleural effusion or pneumothorax is identified. No  acute osseous abnormality is seen. IMPRESSION: Hypoinflation with mild hazy left mid lung opacity which may reflect atelectasis or pneumonia. Electronically Signed   By: Logan Bores M.D.   On: 10/27/2019 04:00     LOS: 1 day   Antonieta Pert, MD [facility]  10/28/2019, 7:44 AM

## 2019-10-29 ENCOUNTER — Other Ambulatory Visit (HOSPITAL_COMMUNITY): Payer: Self-pay | Admitting: Psychiatry

## 2019-10-29 DIAGNOSIS — N179 Acute kidney failure, unspecified: Secondary | ICD-10-CM | POA: Diagnosis not present

## 2019-10-29 DIAGNOSIS — G9341 Metabolic encephalopathy: Secondary | ICD-10-CM | POA: Diagnosis not present

## 2019-10-29 LAB — COMPREHENSIVE METABOLIC PANEL
ALT: 31 U/L (ref 0–44)
AST: 33 U/L (ref 15–41)
Albumin: 2.3 g/dL — ABNORMAL LOW (ref 3.5–5.0)
Alkaline Phosphatase: 108 U/L (ref 38–126)
Anion gap: 9 (ref 5–15)
BUN: 59 mg/dL — ABNORMAL HIGH (ref 6–20)
CO2: 25 mmol/L (ref 22–32)
Calcium: 8.1 mg/dL — ABNORMAL LOW (ref 8.9–10.3)
Chloride: 101 mmol/L (ref 98–111)
Creatinine, Ser: 1.55 mg/dL — ABNORMAL HIGH (ref 0.44–1.00)
GFR, Estimated: 36 mL/min — ABNORMAL LOW (ref >60)
Glucose, Bld: 99 mg/dL (ref 70–99)
Potassium: 3.4 mmol/L — ABNORMAL LOW (ref 3.5–5.1)
Sodium: 135 mmol/L (ref 135–145)
Total Bilirubin: 0.6 mg/dL (ref 0.3–1.2)
Total Protein: 4.9 g/dL — ABNORMAL LOW (ref 6.5–8.1)

## 2019-10-29 LAB — MAGNESIUM: Magnesium: 1.6 mg/dL — ABNORMAL LOW (ref 1.7–2.4)

## 2019-10-29 LAB — GASTROINTESTINAL PANEL BY PCR, STOOL (REPLACES STOOL CULTURE)

## 2019-10-29 LAB — CBC
HCT: 32.2 % — ABNORMAL LOW (ref 36.0–46.0)
Hemoglobin: 10.4 g/dL — ABNORMAL LOW (ref 12.0–15.0)
MCH: 28.5 pg (ref 26.0–34.0)
MCHC: 32.3 g/dL (ref 30.0–36.0)
MCV: 88.2 fL (ref 80.0–100.0)
Platelets: 234 10*3/uL (ref 150–400)
RBC: 3.65 MIL/uL — ABNORMAL LOW (ref 3.87–5.11)
RDW: 15.5 % (ref 11.5–15.5)
WBC: 10.1 10*3/uL (ref 4.0–10.5)
nRBC: 0.2 % (ref 0.0–0.2)

## 2019-10-29 LAB — GLUCOSE, CAPILLARY
Glucose-Capillary: 107 mg/dL — ABNORMAL HIGH (ref 70–99)
Glucose-Capillary: 108 mg/dL — ABNORMAL HIGH (ref 70–99)
Glucose-Capillary: 97 mg/dL (ref 70–99)
Glucose-Capillary: 99 mg/dL (ref 70–99)

## 2019-10-29 LAB — GAMMA GT: GGT: 54 U/L — ABNORMAL HIGH (ref 7–50)

## 2019-10-29 LAB — PHOSPHORUS: Phosphorus: 1.6 mg/dL — ABNORMAL LOW (ref 2.5–4.6)

## 2019-10-29 MED ORDER — FENTANYL CITRATE (PF) 100 MCG/2ML IJ SOLN
12.5000 ug | Freq: Once | INTRAMUSCULAR | Status: AC
  Administered 2019-10-29: 12.5 ug via INTRAVENOUS
  Filled 2019-10-29: qty 2

## 2019-10-29 MED ORDER — POTASSIUM & SODIUM PHOSPHATES 280-160-250 MG PO PACK
1.0000 | PACK | Freq: Three times a day (TID) | ORAL | Status: AC
  Administered 2019-10-29 – 2019-10-31 (×8): 1 via ORAL
  Filled 2019-10-29 (×8): qty 1

## 2019-10-29 MED ORDER — SACCHAROMYCES BOULARDII 250 MG PO CAPS
250.0000 mg | ORAL_CAPSULE | Freq: Two times a day (BID) | ORAL | Status: DC
  Administered 2019-10-29 – 2019-11-02 (×9): 250 mg via ORAL
  Filled 2019-10-29 (×9): qty 1

## 2019-10-29 NOTE — Progress Notes (Addendum)
PROGRESS NOTE    Tiffany [name]  ION:[id number] DOB: [birth year] DOA: 10/27/2019 PCP: [name], DO    Chief Complaint  Patient presents with  . Altered Mental Status    Brief Narrative:  59 year old female with history of hypertension on amlodipine, hyperlipidemia, and anemia, CKDIIIb baseline creatinine 1.5-1.8 diabetes, COPD brought to the ED with lethargy/altered mental status.  On presentation patient unable to participate in the family available unable to contact so history is limited. Apparently she was found increasingly confused and weak, family became concerned and called EMS.  Had recent PCP visit 10/22/2019 for diarrhea "not feeling well", advised supportive care.  There has been no report of diarrhea as a consult for this visit.  In ED,Patient was found to have altered mental status minimally responding, work-up showed acute kidney injury, metabolic acidosis, hyperkalemia.  No acute significant finding. Nephrology was consulted.  Subjective:  Not oriented to time, wants to be turned due to chronic back pain Asked why she is not getting  neurontin and trazodone Has intermittent bradycardia in the high fifties on telemetry  Assessment & Plan:   Active Problems:   ARF (acute renal failure) (HCC)   Pressure injury of skin  AKI on CKD IIIb/metabolic acidosis, baseline creatinine 1.5-1.8: Appreciate nephrology input,avoid NSAID medication ACE or ARB.Continue on IV fluids, was on bicarb since the ED- changing to D5W.CK slightly elevated at 547.  8 cm cystic mass anterior to bladder per renal ultrasound  will discuss with nephrology regarding further work-up  Acute encephalopathy likely metabolic/uremic: seems to be resolving.  CT head No acute finding.  LFTs and ammonia are stable.  UA no evidence of UTI, Chest x-ray hypoinflation with mild hazy left midlung opacity may relate atelectasis or pneumonia but patient without fever or leukocytosis, procal altho  up 6.2-low suspicion for infection.UDSpositive for benzo.  COVID-19 screen negative Tylenol level salicylate level undetectable.  TSH 1.2  Hypokalemia/hypophosphotemia, replace and recheck in am  Hypomagnesemia, h/o mag allergy  Hypertension: BP stable.  Continue prazosin, Norvasc hold metolazone  Prediabetes with hemoglobin A1c 5.6, monitor CBG  Neuropathy on Neurontin and holding for now due to AKI  Gout- hold colchicine , due to AKI  COPD/chronic respiratory failure with hypoxia/hypercapnia/OSA: Followed by [facility], on 2 L nasal cannula baseline, also has been prescribed a trilogy ventilator usee every night.Cont same, cont nebs prn.  H/o hypoxic brain injury h/o schizoaffective; she requested psychiatry consult, order placed  FTT get Pt eval  DVT prophylaxis: heparin injection 5,000 Units Start: 10/27/19 0930   Code Status: Full Family Communication: will call family tomorrow Disposition:   Status is: Inpatient  Dispo: The patient is from: Home              Anticipated d/c is to:  will get PT eval              Anticipated d/c date is: 24 to 48 hours, needs psychiatry input, need nephrology clearance                Consultants:   Nephrology  Psychiatry  Procedures:   None  Antimicrobials:   None     Objective: Vitals:   10/28/19 1938 10/28/19 2248 10/28/19 2251 10/29/19 0805  BP: 96/64 118/90 118/90   Pulse: 65  (!) 55   Resp:      Temp: 98.1 F (36.7 C)     TempSrc: Oral     SpO2: 95%   94%    Intake/Output  Summary (Last 24 hours) at 10/29/2019 0955 Last data filed at 10/29/2019 0900 Gross per 24 hour  Intake 1080 ml  Output 1150 ml  Net -70 ml   There were no vitals filed for this visit.  Examination:  General exam; not oriented to time, alert and interactive Respiratory system: Clear to auscultation. Respiratory effort normal. Cardiovascular system: S1 & S2 heard, RRR. No JVD, no murmur, No pedal edema. Gastrointestinal  system: Abdomen is nondistended, soft and nontender.  Normal bowel sounds heard. Central nervous system: Alert and oriented to person and place.  Not to time, moving all extremities Extremities: Generalized weakness, no edema Skin: No rashes, lesions or ulcers Psychiatry: Confused, interactive, intermittent agitation    Data Reviewed: I have personally reviewed following labs and imaging studies  CBC: Recent Labs  Lab 10/27/19 0358 10/27/19 0523 10/27/19 1041 10/28/19 0452 10/29/19 0631  WBC 8.5  --  7.4 9.2 10.1  NEUTROABS 5.9  --   --   --   --   HGB 13.3 14.3 12.1 11.6* 10.4*  HCT 43.1 42.0 38.5 35.9* 32.2*  MCV 91.3  --  89.1 86.3 88.2  PLT 310  --  304 270 382    Basic Metabolic Panel: Recent Labs  Lab 10/27/19 0358 10/27/19 0358 10/27/19 0519 10/27/19 0523 10/27/19 1041 10/28/19 0452 10/29/19 0631  NA 144  --   --  145 147* 147*  150* 135  K 6.8*  --   --  4.8 4.2 3.4*  3.6 3.4*  CL 116*  --   --  120* 115* 110  111 101  CO2 14*  --   --   --  16* 25  25 25   GLUCOSE 89  --   --  86 144* 123*  123* 99  BUN 110*  --   --  92* 99* 79*  81* 59*  CREATININE 3.95*   < > 4.10* 3.90* 3.27* 2.26*  2.19* 1.55*  CALCIUM 9.2  --   --   --  9.0 8.7*  8.8* 8.1*  MG  --   --   --   --   --   --  1.6*  PHOS  --   --   --   --  4.4 2.0* 1.6*   < > = values in this interval not displayed.    GFR: CrCl cannot be calculated (Unknown ideal weight.).  Liver Function Tests: Recent Labs  Lab 10/27/19 0358 10/27/19 1041 10/28/19 0452 10/29/19 0631  AST 40  --  29 33  ALT 31  --  30 31  ALKPHOS 175*  --  127* 108  BILITOT 1.4*  --  0.6 0.6  PROT 6.8  --  5.7* 4.9*  ALBUMIN 3.1* 2.9* 2.6*  2.6* 2.3*    CBG: Recent Labs  Lab 10/28/19 0602 10/28/19 1136 10/28/19 1753 10/28/19 2349 10/29/19 0509  GLUCAP 109* 141* 100* 115* 97     Recent Results (from the past 240 hour(s))  Respiratory Panel by RT PCR (Flu A&B, Covid) - Nasopharyngeal Swab     Status:  None   Collection Time: 10/27/19  3:58 AM   Specimen: Nasopharyngeal Swab  Result Value Ref Range Status   SARS Coronavirus 2 by RT PCR NEGATIVE NEGATIVE Final    Comment: (NOTE) SARS-CoV-2 target nucleic acids are NOT DETECTED.  The SARS-CoV-2 RNA is generally detectable in upper respiratoy specimens during the acute phase of infection. The lowest concentration of SARS-CoV-2 viral copies this assay  can detect is 131 copies/mL. A negative result does not preclude SARS-Cov-2 infection and should not be used as the sole basis for treatment or other patient management decisions. A negative result may occur with  improper specimen collection/handling, submission of specimen other than nasopharyngeal swab, presence of viral mutation(s) within the areas targeted by this assay, and inadequate number of viral copies (<131 copies/mL). A negative result must be combined with clinical observations, patient history, and epidemiological information. The expected result is Negative.  Fact Sheet for Patients:  PinkCheek.be  Fact Sheet for Healthcare Providers:  GravelBags.it  This test is no t yet approved or cleared by the [place] FDA and  has been authorized for detection and/or diagnosis of SARS-CoV-2 by FDA under an Emergency Use Authorization (EUA). This EUA will remain  in effect (meaning this test can be used) for the duration of the COVID-19 declaration under Section 564(b)(1) of the Act, 21 U.S.C. section 360bbb-3(b)(1), unless the authorization is terminated or revoked sooner.     Influenza A by PCR NEGATIVE NEGATIVE Final   Influenza B by PCR NEGATIVE NEGATIVE Final    Comment: (NOTE) The Xpert Xpress SARS-CoV-2/FLU/RSV assay is intended as an aid in  the diagnosis of influenza from Nasopharyngeal swab specimens and  should not be used as a sole basis for treatment. Nasal washings and  aspirates are unacceptable for  Xpert Xpress SARS-CoV-2/FLU/RSV  testing.  Fact Sheet for Patients: PinkCheek.be  Fact Sheet for Healthcare Providers: GravelBags.it  This test is not yet approved or cleared by the [place] FDA and  has been authorized for detection and/or diagnosis of SARS-CoV-2 by  FDA under an Emergency Use Authorization (EUA). This EUA will remain  in effect (meaning this test can be used) for the duration of the  Covid-19 declaration under Section 564(b)(1) of the Act, 21  U.S.C. section 360bbb-3(b)(1), unless the authorization is  terminated or revoked. Performed at [facility], [place] [address], [place], [place] [address]   Urine culture     Status: None   Collection Time: 10/27/19  3:58 AM   Specimen: Urine, Catheterized  Result Value Ref Range Status   Specimen Description   Final    URINE, CATHETERIZED Performed at [place] [address], [name], [place] [address]    Special Requests   Final    NONE Performed at [facility], [place] [address], [place], [place] [address]    Culture   Final    NO GROWTH Performed at [facility], [place] [address], [place], [place] [address]    Report Status 10/28/2019 FINAL  Final  C Difficile Quick Screen w PCR reflex     Status: Abnormal   Collection Time: 10/28/19 12:36 PM   Specimen: Stool  Result Value Ref Range Status   C Diff antigen POSITIVE (A) NEGATIVE Final   C Diff toxin NEGATIVE NEGATIVE Final   C Diff interpretation Results are indeterminate. See PCR results.  Final    Comment: Performed at [facility], [place] [address] [place], [place] [address]  C. Diff by PCR, Reflexed     Status: None   Collection Time: 10/28/19 12:36 PM  Result Value Ref Range Status   Toxigenic C. Difficile by PCR NEGATIVE NEGATIVE Final    Comment: Patient is colonized with non toxigenic C. difficile. May not  need treatment unless significant symptoms are present. Performed at [facility], [place] [address]  [place], [place] [address]          Radiology Studies: No results found.      Scheduled Meds: . amLODipine  10 mg Oral Daily  . Chlorhexidine Gluconate Cloth  6 each Topical Daily  . ferrous sulfate  325 mg Oral BID WC  . fluticasone furoate-vilanterol  1 puff Inhalation Daily   Or  . umeclidinium bromide  1 puff Inhalation Daily  . heparin  5,000 Units Subcutaneous Q8H  . montelukast  10 mg Oral QHS  . oxybutynin  10 mg Oral QHS  . potassium & sodium phosphates  1 packet Oral TID WC & HS  . prazosin  5 mg Oral BID  . saccharomyces boulardii  250 mg Oral BID   Continuous Infusions: . dextrose 100 mL/hr at 10/28/19 1932     LOS: 2 days   Time spent:35 mins Greater than 50% of this time was spent in counseling, explanation of diagnosis, planning of further management, and coordination of care.  I have personally reviewed and interpreted on  10/29/2019 daily labs, tele strips, I reviewed all nursing notes, pharmacy notes, consultant notes,  vitals, pertinent old records  I have discussed plan of care as described above with RN , patient  on 10/29/2019  Voice Recognition /Dragon dictation system was used to create this note, attempts have been made to correct errors. Please contact the author with questions and/or clarifications.   Florencia Reasons, MD PhD FACP [facility]  Available via Epic secure chat 7am-7pm for nonurgent issues Please page for urgent issues To page the attending provider between 7A-7P or the covering provider during after hours 7P-7A, please log into the web site [email or web address] and access using universal [place] password for that web site. If you do not have the password, please call the hospital operator.    10/29/2019, 9:55 AM

## 2019-10-29 NOTE — TOC Progression Note (Signed)
Transition of Care [facility]) - Progression Note    Patient Details  Name: Tiffany [name] MRN: [id number] Date of Birth: [birth year]  Transition of Care [facility]) CM/SW Contact  Purcell Mouton, RN Phone Number: 10/29/2019, 9:52 AM  Clinical Narrative:    Pt from home with children. TOC will continue to follow.    Expected Discharge Plan: [place] Barriers to Discharge: No Barriers Identified  Expected Discharge Plan and Services Expected Discharge Plan: [place] arrangements for the past 2 months: Single Family Home                                       Social Determinants of Health (SDOH) Interventions    Readmission Risk Interventions No flowsheet data found.

## 2019-10-29 NOTE — Progress Notes (Signed)
[facility] ROUNDING NOTE   Subjective:   Brief history this 59 year old lady with a baseline serum creatinine 1.5 to 1.8 mg/dL.  She has a history of hypertension diabetes and COPD.  She presents with altered mental status.  She has a history of hypoxic brain injury with diagnosis schizoaffective to hold and uses multiple neuro sedative medications.  Urine output 1.2 L 10/28/2019  Blood pressure 118/90 pulse 63 temperature 98.1 O2 sats 95% room air  Renal panel pending, hemoglobin 10.4  Amlodipine 10 mg daily Singulair 10 mg daily Ditropan 10 mg nightly Minipress 5 mg twice daily  Objective:  Vital signs in last 24 hours:  Temp:  [97.4 F (36.3 C)-98.1 F (36.7 C)] 98.1 F (36.7 C) (10/12 1938) Pulse Rate:  [55-65] 55 (10/12 2251) BP: (96-129)/(64-90) 118/90 (10/12 2251) SpO2:  [95 %-98 %] 95 % (10/12 1938)  Weight change:  There were no vitals filed for this visit.  Intake/Output: I/O last 3 completed shifts: In: 1209.2 [P.O.:960; I.V.:249.2] Out: 1800 [Urine:1800]   Intake/Output this shift:  No intake/output data recorded.  General:  More awake and alert HEENT: Atraumatic external appearance normal Eyes: Pupils round equal reactive Neck: Neck was supple JVP flat Heart: Regular rate and rhythm no murmurs rubs or gallops Lungs: Clear to auscultation no wheeze or rales Abdomen: Soft nontender bowel sounds present Extremities: No cyanosis clubbing or edema Skin: Appears to be sunken with poor turgor Neuro: Alert but not responsive to commands   Basic Metabolic Panel: Recent Labs  Lab 10/27/19 0358 10/27/19 0519 10/27/19 0523 10/27/19 1041 10/28/19 0452  NA 144  --  145 147* 147*  150*  K 6.8*  --  4.8 4.2 3.4*  3.6  CL 116*  --  120* 115* 110  111  CO2 14*  --   --  16* 25  25  GLUCOSE 89  --  86 144* 123*  123*  BUN 110*  --  92* 99* 79*  81*  CREATININE 3.95* 4.10* 3.90* 3.27* 2.26*  2.19*  CALCIUM 9.2  --   --  9.0 8.7*  8.8*   PHOS  --   --   --  4.4 2.0*    Liver Function Tests: Recent Labs  Lab 10/27/19 0358 10/27/19 1041 10/28/19 0452  AST 40  --  29  ALT 31  --  30  ALKPHOS 175*  --  127*  BILITOT 1.4*  --  0.6  PROT 6.8  --  5.7*  ALBUMIN 3.1* 2.9* 2.6*  2.6*   No results for input(s): LIPASE, AMYLASE in the last 168 hours. Recent Labs  Lab 10/27/19 0358  AMMONIA 16    CBC: Recent Labs  Lab 10/27/19 0358 10/27/19 0523 10/27/19 1041 10/28/19 0452 10/29/19 0631  WBC 8.5  --  7.4 9.2 10.1  NEUTROABS 5.9  --   --   --   --   HGB 13.3 14.3 12.1 11.6* 10.4*  HCT 43.1 42.0 38.5 35.9* 32.2*  MCV 91.3  --  89.1 86.3 88.2  PLT 310  --  304 270 234    Cardiac Enzymes: Recent Labs  Lab 10/27/19 0851  CKTOTAL 547*    BNP: Invalid input(s): POCBNP  CBG: Recent Labs  Lab 10/28/19 0602 10/28/19 1136 10/28/19 1753 10/28/19 2349 10/29/19 0509  GLUCAP 109* 141* 100* 115* 4    Microbiology: Results for orders placed or performed during the hospital encounter of 10/27/19  Respiratory Panel by RT PCR (Flu A&B, Covid) -  Nasopharyngeal Swab     Status: None   Collection Time: 10/27/19  3:58 AM   Specimen: Nasopharyngeal Swab  Result Value Ref Range Status   SARS Coronavirus 2 by RT PCR NEGATIVE NEGATIVE Final    Comment: (NOTE) SARS-CoV-2 target nucleic acids are NOT DETECTED.  The SARS-CoV-2 RNA is generally detectable in upper respiratoy specimens during the acute phase of infection. The lowest concentration of SARS-CoV-2 viral copies this assay can detect is 131 copies/mL. A negative result does not preclude SARS-Cov-2 infection and should not be used as the sole basis for treatment or other patient management decisions. A negative result may occur with  improper specimen collection/handling, submission of specimen other than nasopharyngeal swab, presence of viral mutation(s) within the areas targeted by this assay, and inadequate number of viral copies (<131 copies/mL). A  negative result must be combined with clinical observations, patient history, and epidemiological information. The expected result is Negative.  Fact Sheet for Patients:  PinkCheek.be  Fact Sheet for Healthcare Providers:  GravelBags.it  This test is no t yet approved or cleared by the [place] FDA and  has been authorized for detection and/or diagnosis of SARS-CoV-2 by FDA under an Emergency Use Authorization (EUA). This EUA will remain  in effect (meaning this test can be used) for the duration of the COVID-19 declaration under Section 564(b)(1) of the Act, 21 U.S.C. section 360bbb-3(b)(1), unless the authorization is terminated or revoked sooner.     Influenza A by PCR NEGATIVE NEGATIVE Final   Influenza B by PCR NEGATIVE NEGATIVE Final    Comment: (NOTE) The Xpert Xpress SARS-CoV-2/FLU/RSV assay is intended as an aid in  the diagnosis of influenza from Nasopharyngeal swab specimens and  should not be used as a sole basis for treatment. Nasal washings and  aspirates are unacceptable for Xpert Xpress SARS-CoV-2/FLU/RSV  testing.  Fact Sheet for Patients: PinkCheek.be  Fact Sheet for Healthcare Providers: GravelBags.it  This test is not yet approved or cleared by the [place] FDA and  has been authorized for detection and/or diagnosis of SARS-CoV-2 by  FDA under an Emergency Use Authorization (EUA). This EUA will remain  in effect (meaning this test can be used) for the duration of the  Covid-19 declaration under Section 564(b)(1) of the Act, 21  U.S.C. section 360bbb-3(b)(1), unless the authorization is  terminated or revoked. Performed at [facility], [place] [address] [place], [place] [address]   Urine culture     Status: None   Collection Time: 10/27/19  3:58 AM   Specimen: Urine, Catheterized  Result Value Ref Range Status    Specimen Description   Final    URINE, CATHETERIZED Performed at [facility] [address] [place], [place] [address]    Special Requests   Final    NONE Performed at [facility], [place] [address], [place], [place] [address]    Culture   Final    NO GROWTH Performed at [facility], [place] [address], [place], [place] [address]    Report Status 10/28/2019 FINAL  Final  C Difficile Quick Screen w PCR reflex     Status: Abnormal   Collection Time: 10/28/19 12:36 PM   Specimen: Stool  Result Value Ref Range Status   C Diff antigen POSITIVE (A) NEGATIVE Final   C Diff toxin NEGATIVE NEGATIVE Final   C Diff interpretation Results are indeterminate. See PCR results.  Final    Comment: Performed at [facility]  [place] [address], [place], [place] [address]  C. Diff by PCR, Reflexed     Status: None   Collection Time: 10/28/19 12:36 PM  Result Value Ref Range Status   Toxigenic C. Difficile by PCR NEGATIVE NEGATIVE Final    Comment: Patient is colonized with non toxigenic C. difficile. May not need treatment unless significant symptoms are present. Performed at [facility], [place] [address] [place],  [address]    *Note: Due to a large number of results and/or encounters for the requested time period, some results have not been displayed. A complete set of results can be found in Results Review.    Coagulation Studies: No results for input(s): LABPROT, INR in the last 72 hours.  Urinalysis: Recent Labs    10/27/19 0358  COLORURINE YELLOW  LABSPEC 1.014  PHURINE 5.0  GLUCOSEU NEGATIVE  HGBUR MODERATE*  BILIRUBINUR NEGATIVE  KETONESUR 5*  PROTEINUR NEGATIVE  NITRITE NEGATIVE  LEUKOCYTESUR NEGATIVE      Imaging: No results found.   Medications:   . dextrose 100 mL/hr at 10/28/19 1932   . amLODipine  10 mg Oral Daily  . Chlorhexidine Gluconate Cloth  6 each Topical Daily  . ferrous sulfate   325 mg Oral BID WC  . fluticasone furoate-vilanterol  1 puff Inhalation Daily   Or  . umeclidinium bromide  1 puff Inhalation Daily  . heparin  5,000 Units Subcutaneous Q8H  . montelukast  10 mg Oral QHS  . oxybutynin  10 mg Oral QHS  . prazosin  5 mg Oral BID   albuterol, ipratropium-albuterol, ondansetron **OR** ondansetron (ZOFRAN) IV  Assessment/ Plan:  1.Acute on chronic kidney disease.  Baseline serum creatinine is about 1.5 mg/dL.  Would continue to avoid nephrotoxins nonsteroidal anti-inflammatory drugs ACE inhibitor's or ARB's.  Blood pressure appears to be adequate.  There is no evidence of any hypotension.  CPK mildly increased.  Statins have been discontinued. 2. Hypertension/volume  -appears volume depleted.  I believe she has had poor p.o. intake.  I think we should hydrate gently.  D5W 100 cc an hour we will continue to follow.  Renal panel pending. 3.  Metabolic acidosis-resolved 4.  Hyperkalemia resolved 5.  Reported altered mental status patient with head injury with again agree with holding neuro sedative medications.  6.  Hypernatremia.  With free water deficit will change to IV D5W at 100 cc an hour 7.  Hypophosphatemia will replete   LOS: 2 Sherril Croon @TODAY @7 :19 AM

## 2019-10-29 NOTE — Plan of Care (Signed)

## 2019-10-30 ENCOUNTER — Inpatient Hospital Stay (HOSPITAL_COMMUNITY): Payer: Medicare Other

## 2019-10-30 ENCOUNTER — Encounter (HOSPITAL_COMMUNITY): Payer: Self-pay | Admitting: Internal Medicine

## 2019-10-30 DIAGNOSIS — N179 Acute kidney failure, unspecified: Secondary | ICD-10-CM | POA: Diagnosis not present

## 2019-10-30 DIAGNOSIS — G9341 Metabolic encephalopathy: Secondary | ICD-10-CM | POA: Diagnosis not present

## 2019-10-30 LAB — RENAL FUNCTION PANEL
Albumin: 2.5 g/dL — ABNORMAL LOW (ref 3.5–5.0)
Anion gap: 9 (ref 5–15)
BUN: 36 mg/dL — ABNORMAL HIGH (ref 6–20)
CO2: 23 mmol/L (ref 22–32)
Calcium: 8.1 mg/dL — ABNORMAL LOW (ref 8.9–10.3)
Chloride: 101 mmol/L (ref 98–111)
Creatinine, Ser: 1.35 mg/dL — ABNORMAL HIGH (ref 0.44–1.00)
GFR, Estimated: 43 mL/min — ABNORMAL LOW (ref >60)
Glucose, Bld: 97 mg/dL (ref 70–99)
Phosphorus: 2.2 mg/dL — ABNORMAL LOW (ref 2.5–4.6)
Potassium: 3.1 mmol/L — ABNORMAL LOW (ref 3.5–5.1)
Sodium: 133 mmol/L — ABNORMAL LOW (ref 135–145)

## 2019-10-30 LAB — GLUCOSE, CAPILLARY
Glucose-Capillary: 100 mg/dL — ABNORMAL HIGH (ref 70–99)
Glucose-Capillary: 108 mg/dL — ABNORMAL HIGH (ref 70–99)
Glucose-Capillary: 94 mg/dL (ref 70–99)
Glucose-Capillary: 95 mg/dL (ref 70–99)

## 2019-10-30 LAB — PHOSPHORUS: Phosphorus: 2.2 mg/dL — ABNORMAL LOW (ref 2.5–4.6)

## 2019-10-30 MED ORDER — IOHEXOL 300 MG/ML  SOLN
100.0000 mL | Freq: Once | INTRAMUSCULAR | Status: AC | PRN
  Administered 2019-10-30: 100 mL via INTRAVENOUS

## 2019-10-30 MED ORDER — ASENAPINE MALEATE 5 MG SL SUBL
10.0000 mg | SUBLINGUAL_TABLET | Freq: Two times a day (BID) | SUBLINGUAL | Status: DC
  Administered 2019-10-30 – 2019-11-02 (×6): 10 mg via SUBLINGUAL
  Filled 2019-10-30 (×6): qty 2

## 2019-10-30 MED ORDER — VANCOMYCIN 50 MG/ML ORAL SOLUTION
125.0000 mg | Freq: Four times a day (QID) | ORAL | Status: DC
  Administered 2019-10-30 – 2019-11-01 (×6): 125 mg via ORAL
  Filled 2019-10-30 (×10): qty 2.5

## 2019-10-30 MED ORDER — FENTANYL CITRATE (PF) 100 MCG/2ML IJ SOLN
12.5000 ug | Freq: Once | INTRAMUSCULAR | Status: AC
  Administered 2019-10-30: 12.5 ug via INTRAVENOUS
  Filled 2019-10-30: qty 2

## 2019-10-30 MED ORDER — GABAPENTIN 100 MG PO CAPS
100.0000 mg | ORAL_CAPSULE | Freq: Two times a day (BID) | ORAL | Status: DC
  Administered 2019-10-30 – 2019-11-02 (×6): 100 mg via ORAL
  Filled 2019-10-30 (×6): qty 1

## 2019-10-30 MED ORDER — POTASSIUM CHLORIDE CRYS ER 20 MEQ PO TBCR
40.0000 meq | EXTENDED_RELEASE_TABLET | Freq: Once | ORAL | Status: AC
  Administered 2019-10-30: 40 meq via ORAL
  Filled 2019-10-30: qty 2

## 2019-10-30 MED ORDER — DEXTROSE-NACL 5-0.45 % IV SOLN: INTRAVENOUS | Status: DC

## 2019-10-30 MED ORDER — ZINC OXIDE 40 % EX OINT
TOPICAL_OINTMENT | CUTANEOUS | Status: DC | PRN
  Filled 2019-10-30 (×2): qty 57

## 2019-10-30 NOTE — Progress Notes (Signed)
PT Cancellation Note  Patient Details Name: Tiffany [name] MRN: [id number] DOB: 16-Feb-1960   Cancelled Treatment:    Reason Eval/Treat Not Completed: Patient at procedure or test/unavailable (pt at CT. Will follow.)  [name] PT 10/30/2019  Acute Rehabilitation Services Pager [phone] Office [phone]

## 2019-10-30 NOTE — Consult Note (Signed)
[facility] [facility] Consult   Reason for Consult:  Hallucinations  Referring Physician:  Dr Florencia Reasons Patient Identification: Tiffany [name] MRN:  [id number] Principal Diagnosis: ARF Diagnosis:  Active Problems:   Schizoaffective disorder, bipolar type ([place])   ARF (acute renal failure) ([place])   Pressure injury of skin   Total Time spent with patient: 45 minutes  Subjective:   Tiffany [name] is a 59 y.o. female patient admitted with confusion and ARF.  HPI:  59 yo  Female seen and evaluated in person by this provider.  Consult placed for hallucinations r/t her history of schizoaffective disorder, bipolar type.  She presented to the hospital in a state of confusion and thought to be over medicated at the time.  Today, she is clear and coherent with no suicidal/homicidal ideations, hallucinations, or mania symptoms.  "I want to get some medications before I get there."  Discussed her medications:  Saphris, gabapentin, and Trazodone restarting.  She also wanted her Valium and explained this would not be restarted by this practitioner as she reports using it for hand issues.  Started lower doses based on the medication side effects and the patient's age.  Past Psychiatric History: schizoaffective d/o  Risk to Self:  none Risk to Others:  none Prior Inpatient Therapy:  yes Prior Outpatient Therapy:  [facility]  Past Medical History:  Past Medical History:  Diagnosis Date  . Agitation 11/22/2017  . Anoxic brain injury ([place]) 09/08/2016   C. Arrest due to respiratory failure and COPD exacerbation  . Anxiety   . Arthritis    "all over" (04/10/2016)  . Asthma 10/18/2010  . Binge eating disorder   . Cardiac arrest ([place]) 09/08/2016   PEA  . Carotid artery stenosis    1-39% bilateral by dopplers 11/2016  . Chronic diastolic (congestive) heart failure ([place])   . Chronic kidney disease    "I see a kidney dr." (04/10/2016)  . Chronic pain syndrome 06/18/2012  . Chronic post-traumatic  stress disorder (PTSD) 05/27/2018  . Chronic respiratory failure with hypoxia and hypercapnia (HCC) 06/22/2015   TRILOGY Vent >AVAPA-ES., Vt target 200-400, Max P 30 , PS max 20 , PS min 6-10 , E Max 6, E Min 4, Rate Auto AVAPS Rate 2 (titrate for pt comfort) , bleed O2 at 5l/m continuous flow .   [name] CKD (chronic kidney disease) stage 3, GFR 30-59 ml/min (HCC) 12/15/2016  . Closed displaced fracture of fifth metacarpal bone 03/21/2018  . Cocaine use disorder, severe, in sustained remission ([place]) 12/17/2015  . Complication of anesthesia    decreased bp, decreased heart rate  . COPD (chronic obstructive pulmonary disease) ([place]) 07/08/2014  . Depression   . Diabetic neuropathy ([place]) 04/24/2011  . Difficulty with speech 01/24/2018  . Disorder of nervous system   . Drug abuse ([place]) 11/21/2017  . Dyslipidemia 04/24/2011  . Elevated troponin 04/28/2012  . Emphysema   . Encephalopathy 11/21/2017  . Essential hypertension 03/22/2016  . Fibula fracture 07/10/2016  . Frequent falls 10/11/2017  . GERD (gastroesophageal reflux disease)   . Gout 04/11/2017  . Heart attack ([name]) 1980s  . History of blood transfusion 1994   "couldn't stop bleeding from my period"  . History of drug abuse in remission ([place]) 11/28/2015   Quit in 2017  . Hyperlipidemia LDL goal <70   . Incontinence   . Manic depression ([place])   . Morbid obesity ([place]) 10/18/2010  . Obstructive sleep apnea 10/18/2010  . On home oxygen therapy    "  6L; 24/7" (04/10/2016)  . OSA on CPAP    "wear mask sometimes" (04/10/2016)  . Paranoid ([place])    "sometimes; I'm on RX for it" (04/10/2016)  . Prolonged Q-T interval on ECG   . Rectal bleeding 12/31/2015  . Right carotid bruit 11/09/2016  . Schizoaffective disorder, bipolar type ([place]) 04/05/2018  . Seasonal allergies   . Seborrheic keratoses 12/31/2013  . Seizures ([place])    "don't know what kind; last one was ~ 1 yr ago" (04/10/2016)  . Stroke [facility]) 1980s   denies residual on 04/10/2016  . Thrush 09/19/2013  .  Type 2 diabetes mellitus ([place]) 10/18/2010    Past Surgical History:  Procedure Laterality Date  . CESAREAN SECTION  1997  . HERNIA REPAIR    . IR CHOLANGIOGRAM EXISTING TUBE  07/20/2016  . IR PERC CHOLECYSTOSTOMY  05/10/2016  . IR RADIOLOGIST EVAL & MGMT  06/08/2016  . IR RADIOLOGIST EVAL & MGMT  06/29/2016  . IR SINUS/FIST TUBE CHK-NON GI  07/12/2016  . RIGHT/LEFT HEART CATH AND CORONARY ANGIOGRAPHY N/A 06/19/2017   Procedure: RIGHT/LEFT HEART CATH AND CORONARY ANGIOGRAPHY;  Surgeon: Jolaine Artist, MD;  Location: [facility];  Service: Cardiovascular;  Laterality: N/A;  . TIBIA IM NAIL INSERTION Right 07/12/2016   Procedure: INTRAMEDULLARY (IM) NAIL RIGHT TIBIA;  Surgeon: Leandrew Koyanagi, MD;  Location: [facility];  Service: Orthopedics;  Laterality: Right;  . UMBILICAL HERNIA REPAIR  ~ 1963   "that's why I don't have a belly button"  . VAGINAL HYSTERECTOMY     Family History:  Family History  Problem Relation Age of Onset  . Cancer Father        prostate  . Cancer Mother        lung  . Depression Mother   . Depression Sister   . Anxiety disorder Sister   . Schizophrenia Sister   . Bipolar disorder Sister   . Depression Sister   . Depression Brother   . Heart failure Other        cousin   Family Psychiatric  History: see above Social History:  Social History   Substance and Sexual Activity  Alcohol Use No  . Alcohol/week: 0.0 standard drinks     Social History   Substance and Sexual Activity  Drug Use No  . Types: Cocaine   Comment: 04/10/2016 "last used cocaine back in November 2017"    Social History   Socioeconomic History  . Marital status: Widowed    Spouse name: Not on file  . Number of children: 3  . Years of education: Not on file  . Highest education level: Not on file  Occupational History  . Occupation: disabled    Comment: factory production  Tobacco Use  . Smoking status: Former Smoker    Packs/day: 1.50    Years: 38.00    Pack years: 57.00     Types: Cigarettes    Start date: 03/13/1977    Quit date: 04/10/2016    Years since quitting: 3.5  . Smokeless tobacco: Never Used  Vaping Use  . Vaping Use: Never used  Substance and Sexual Activity  . Alcohol use: No    Alcohol/week: 0.0 standard drinks  . Drug use: No    Types: Cocaine    Comment: 04/10/2016 "last used cocaine back in November 2017"  . Sexual activity: Not Currently    Birth control/protection: Surgical  Other Topics Concern  . Not on file  Social History Narrative  Has 1 son, [name]   Lives with son and his boyfriend   Her house has ramps and handrails should she ever needs them.    Her mother lives down the street from her and is a good support person in addition to her son.   She drives herself, has private transportation.    Cocaine free since 02/24/16, smoke free since 04/10/16   Social Determinants of Health   Financial Resource Strain:   . Difficulty of Paying Living Expenses: Not on file  Food Insecurity:   . Worried About Charity fundraiser in the Last Year: Not on file  . Ran Out of Food in the Last Year: Not on file  Transportation Needs:   . Lack of Transportation (Medical): Not on file  . Lack of Transportation (Non-Medical): Not on file  Physical Activity:   . Days of Exercise per Week: Not on file  . Minutes of Exercise per Session: Not on file  Stress:   . Feeling of Stress : Not on file  Social Connections:   . Frequency of Communication with Friends and Family: Not on file  . Frequency of Social Gatherings with Friends and Family: Not on file  . Attends Religious Services: Not on file  . Active Member of Clubs or Organizations: Not on file  . Attends Archivist Meetings: Not on file  . Marital Status: Not on file   Additional Social History:    Allergies:   Allergies  Allergen Reactions  . Hydrocodone Shortness Of Breath  . Hydrocodone-Acetaminophen Shortness Of Breath  . Hydroxyzine Anaphylaxis and Shortness Of  Breath  . Latuda [Lurasidone Hcl] Anaphylaxis  . Lurasidone Anaphylaxis  . Magnesium-Containing Compounds Anaphylaxis  . Prednisone Anaphylaxis, Swelling and Other (See Comments)    Tongue swelling  . Tramadol Anaphylaxis and Swelling  . Codeine Nausea And Vomiting  . Other Rash  . Sulfa Antibiotics Itching  . Tape Rash    Labs:  Results for orders placed or performed during the hospital encounter of 10/27/19 (from the past 48 hour(s))  Glucose, capillary     Status: Abnormal   Collection Time: 10/28/19  5:53 PM  Result Value Ref Range   Glucose-Capillary 100 (H) 70 - 99 mg/dL    Comment: Glucose reference range applies only to samples taken after fasting for at least 8 hours.  Glucose, capillary     Status: Abnormal   Collection Time: 10/28/19 11:49 PM  Result Value Ref Range   Glucose-Capillary 115 (H) 70 - 99 mg/dL    Comment: Glucose reference range applies only to samples taken after fasting for at least 8 hours.  Glucose, capillary     Status: None   Collection Time: 10/29/19  5:09 AM  Result Value Ref Range   Glucose-Capillary 97 70 - 99 mg/dL    Comment: Glucose reference range applies only to samples taken after fasting for at least 8 hours.  Comprehensive metabolic panel     Status: Abnormal   Collection Time: 10/29/19  6:31 AM  Result Value Ref Range   Sodium 135 135 - 145 mmol/L    Comment: DELTA CHECK NOTED   Potassium 3.4 (L) 3.5 - 5.1 mmol/L   Chloride 101 98 - 111 mmol/L   CO2 25 22 - 32 mmol/L   Glucose, Bld 99 70 - 99 mg/dL    Comment: Glucose reference range applies only to samples taken after fasting for at least 8 hours.   BUN 59 (H) 6 -  20 mg/dL   Creatinine, Ser 1.55 (H) 0.44 - 1.00 mg/dL   Calcium 8.1 (L) 8.9 - 10.3 mg/dL   Total Protein 4.9 (L) 6.5 - 8.1 g/dL   Albumin 2.3 (L) 3.5 - 5.0 g/dL   AST 33 15 - 41 U/L   ALT 31 0 - 44 U/L   Alkaline Phosphatase 108 38 - 126 U/L   Total Bilirubin 0.6 0.3 - 1.2 mg/dL   GFR, Estimated 36 (L) >60 mL/min    Anion gap 9 5 - 15    Comment: Performed at [facility], [place] [address] [place], [place] [address]  CBC     Status: Abnormal   Collection Time: 10/29/19  6:31 AM  Result Value Ref Range   WBC 10.1 4.0 - 10.5 K/uL   RBC 3.65 (L) 3.87 - 5.11 MIL/uL   Hemoglobin 10.4 (L) 12.0 - 15.0 g/dL   HCT 32.2 (L) 36 - 46 %   MCV 88.2 80.0 - 100.0 fL   MCH 28.5 26.0 - 34.0 pg   MCHC 32.3 30.0 - 36.0 g/dL   RDW 15.5 11.5 - 15.5 %   Platelets 234 150 - 400 K/uL   nRBC 0.2 0.0 - 0.2 %    Comment: Performed at [facility], [place] [address], [place], [place] [address]  Magnesium     Status: Abnormal   Collection Time: 10/29/19  6:31 AM  Result Value Ref Range   Magnesium 1.6 (L) 1.7 - 2.4 mg/dL    Comment: Performed at [facility], [place] [address], [place], [place] [address]  Gamma GT     Status: Abnormal   Collection Time: 10/29/19  6:31 AM  Result Value Ref Range   GGT 54 (H) 7 - 50 U/L    Comment: Performed at [facility], [place] [address], [place], [place] [address]  Phosphorus     Status: Abnormal   Collection Time: 10/29/19  6:31 AM  Result Value Ref Range   Phosphorus 1.6 (L) 2.5 - 4.6 mg/dL    Comment: Performed at [facility], [place] [address] [place], [place] [address]  Glucose, capillary     Status: Abnormal   Collection Time: 10/29/19 12:00 PM  Result Value Ref Range   Glucose-Capillary 108 (H) 70 - 99 mg/dL    Comment: Glucose reference range applies only to samples taken after fasting for at least 8 hours.  Glucose, capillary     Status: None   Collection Time: 10/29/19  4:38 PM  Result Value Ref Range   Glucose-Capillary 99 70 - 99 mg/dL    Comment: Glucose reference range applies only to samples taken after fasting for at least 8 hours.  Glucose, capillary     Status: Abnormal   Collection Time: 10/29/19 11:30 PM  Result Value Ref Range   Glucose-Capillary 107 (H) 70 - 99 mg/dL     Comment: Glucose reference range applies only to samples taken after fasting for at least 8 hours.  Glucose, capillary     Status: Abnormal   Collection Time: 10/30/19  6:32 AM  Result Value Ref Range   Glucose-Capillary 100 (H) 70 - 99 mg/dL    Comment: Glucose reference range applies only to samples taken after fasting for at least 8 hours.  Renal function panel     Status: Abnormal   Collection Time: 10/30/19  8:20 AM  Result Value Ref Range   Sodium 133 (L) 135 - 145  mmol/L   Potassium 3.1 (L) 3.5 - 5.1 mmol/L   Chloride 101 98 - 111 mmol/L   CO2 23 22 - 32 mmol/L   Glucose, Bld 97 70 - 99 mg/dL    Comment: Glucose reference range applies only to samples taken after fasting for at least 8 hours.   BUN 36 (H) 6 - 20 mg/dL   Creatinine, Ser 1.35 (H) 0.44 - 1.00 mg/dL   Calcium 8.1 (L) 8.9 - 10.3 mg/dL   Phosphorus 2.2 (L) 2.5 - 4.6 mg/dL   Albumin 2.5 (L) 3.5 - 5.0 g/dL   GFR, Estimated 43 (L) >60 mL/min   Anion gap 9 5 - 15    Comment: Performed at [facility], [place] [address] [place], [place] [address]  Phosphorus     Status: Abnormal   Collection Time: 10/30/19  8:20 AM  Result Value Ref Range   Phosphorus 2.2 (L) 2.5 - 4.6 mg/dL    Comment: Performed at [facility], [place] [address], [place], [place] [address]  Glucose, capillary     Status: Abnormal   Collection Time: 10/30/19 11:38 AM  Result Value Ref Range   Glucose-Capillary 108 (H) 70 - 99 mg/dL    Comment: Glucose reference range applies only to samples taken after fasting for at least 8 hours.   *Note: Due to a large number of results and/or encounters for the requested time period, some results have not been displayed. A complete set of results can be found in Results Review.    Current Facility-Administered Medications  Medication Dose Route Frequency Provider Last Rate Last Admin  . amLODipine (NORVASC) tablet 10 mg  10 mg Oral Daily Kyle, Tyrone A, DO   10 mg at 10/30/19  1017  . Chlorhexidine Gluconate Cloth 2 % PADS 6 each  6 each Topical Daily Modena Jansky, MD   6 each at 10/30/19 1021  . dextrose 5 %-0.45 % sodium chloride infusion   Intravenous Continuous Edrick Oh, MD 75 mL/hr at 10/30/19 1025 New Bag at 10/30/19 1025  . ferrous sulfate tablet 325 mg  325 mg Oral BID WC Kc, Ramesh, MD   325 mg at 10/30/19 1017  . fluticasone furoate-vilanterol (BREO ELLIPTA) 100-25 MCG/INH 1 puff  1 puff Inhalation Daily Kc, Ramesh, MD   1 puff at 10/30/19 0947   Or  . umeclidinium bromide (INCRUSE ELLIPTA) 62.5 MCG/INH 1 puff  1 puff Inhalation Daily Kc, Ramesh, MD   1 puff at 10/30/19 0948  . heparin injection 5,000 Units  5,000 Units Subcutaneous Q8H Kyle, Tyrone A, DO   5,000 Units at 10/30/19 1254  . ipratropium-albuterol (DUONEB) 0.5-2.5 (3) MG/3ML nebulizer solution 3 mL  3 mL Nebulization Q6H PRN Marylyn Ishihara, Tyrone A, DO      . liver oil-zinc oxide (DESITIN) 40 % ointment   Topical PRN Florencia Reasons, MD      . montelukast (SINGULAIR) tablet 10 mg  10 mg Oral QHS Kyle, Tyrone A, DO   10 mg at 10/29/19 2118  . ondansetron (ZOFRAN) tablet 4 mg  4 mg Oral Q6H PRN Marylyn Ishihara, Tyrone A, DO       Or  . ondansetron (ZOFRAN) injection 4 mg  4 mg Intravenous Q6H PRN Marylyn Ishihara, Tyrone A, DO   4 mg at 10/29/19 1311  . oxybutynin (DITROPAN-XL) 24 hr tablet 10 mg  10 mg Oral QHS Kyle, Tyrone A, DO   10 mg at 10/29/19 2118  . potassium & sodium phosphates (  PHOS-NAK) 590-931-121 MG packet 1 packet  1 packet Oral TID WC & HS Florencia Reasons, MD   1 packet at 10/30/19 1254  . prazosin (MINIPRESS) capsule 5 mg  5 mg Oral BID Marylyn Ishihara, Tyrone A, DO   5 mg at 10/30/19 1017  . saccharomyces boulardii (FLORASTOR) capsule 250 mg  250 mg Oral BID Florencia Reasons, MD   250 mg at 10/30/19 1017    Musculoskeletal: Strength & Muscle Tone: decreased Gait & Station: did not witness Patient leans: N/A  Psychiatric Specialty Exam: Physical Exam Vitals and nursing note reviewed.  Constitutional:      Appearance: Normal  appearance.  HENT:     Head: Normocephalic.     Nose: Nose normal.  Pulmonary:     Effort: Pulmonary effort is normal.  Musculoskeletal:     Cervical back: Normal range of motion.  Neurological:     General: No focal deficit present.     Mental Status: [name] alert and oriented to person, place, and time.  Psychiatric:        Attention and Perception: Attention and perception normal.        Mood and Affect: Mood is anxious.        Speech: Speech normal.        Behavior: Behavior normal. Behavior is cooperative.        Thought Content: Thought content normal.        Cognition and Memory: Cognition and memory normal.        Judgment: Judgment normal.     Review of Systems  Psychiatric/Behavioral: The patient is nervous/anxious.   All other systems reviewed and are negative.   Blood pressure 124/79, pulse 74, temperature 98.3 F (36.8 C), temperature source Oral, resp. rate (!) 22, height 5\' 2"  (1.575 m), weight 71.5 kg, SpO2 94 %.Body mass index is 28.83 kg/m.  General Appearance: Casual  Eye Contact:  Fair  Speech:  Normal Rate  Volume:  Normal  Mood:  Anxious  Affect:  Congruent  Thought Process:  Coherent and Descriptions of Associations: Intact  Orientation:  Full (Time, Place, and Person)  Thought Content:  WDL and Logical  Suicidal Thoughts:  No  Homicidal Thoughts:  No  Memory:  Immediate;   Good Recent;   Good Remote;   Good  Judgement:  Fair  Insight:  Fair  Psychomotor Activity:  Decreased  Concentration:  Concentration: Good and Attention Span: Good  Recall:  Good  Fund of Knowledge:  Good  Language:  Good  Akathisia:  No  Handed:  Right  AIMS (if indicated):     Assets:  Housing Leisure Time Resilience Social Support  ADL's:  Impaired  Cognition:  WNL  Sleep:        Treatment Plan Summary: Schizoaffective disorder, bipolar type: -Restarted Saphris at 10 mg BID vs TID  -Follow up with outpatient provider  Anxiety: -Restarted gabapentin 100  mg BID  Disposition: No evidence of imminent risk to self or others at present.    Waylan Boga, NP 10/30/2019 4:21 PM
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
## 2019-10-30 NOTE — Care Management Important Message (Signed)
Important Message  Patient Details IM Letter given to the Patient Name: Tiffany [name] MRN: [id number] Date of Birth: 12/23/1960   Medicare Important Message Given:  Yes     Kerin Salen 10/30/2019, 10:44 AM

## 2019-10-30 NOTE — Progress Notes (Signed)
[facility] ROUNDING NOTE   Subjective:   Brief history this 59 year old lady with a baseline serum creatinine 1.5 to 1.8 mg/dL.  She has a history of hypertension diabetes and COPD.  She presents with altered mental status.  She has a history of hypoxic brain injury with diagnosis schizoaffective to hold and uses multiple neuro sedative medications.  Urine output 1.2 L 10/29/2019  Blood pressure 130/84 pulse 63 temperature 97.9 O2 sats 9 9% room air  Renal panel pending, renal panel from 10/29/2019 sodium 135 potassium 3.4 chloride 101 CO2 25 BUN 59 creatinine 1.55 glucose 99 phosphorus 1.6 magnesium 1.6 hemoglobin 10.4  Urine 0 RBCs per high-power from 0 WBCs per high-power from  Renal ultrasound showed a cystic mass anterior to the bladder that appears pretty large.  Would recommend CT evaluation  Amlodipine 10 mg daily Singulair 10 mg daily Ditropan 10 mg nightly Minipress 5 mg twice daily  Objective:  Vital signs in last 24 hours:  Temp:  [97.9 F (36.6 C)-98.1 F (36.7 C)] 97.9 F (36.6 C) (10/14 0507) Pulse Rate:  [54-60] 54 (10/14 0507) Resp:  [18] 18 (10/14 0507) BP: (102-130)/(61-84) 130/84 (10/14 0507) SpO2:  [98 %-100 %] 99 % (10/14 0507)  Weight change:  There were no vitals filed for this visit.  Intake/Output: I/O last 3 completed shifts: In: 1541.7 [P.O.:840; I.V.:701.7] Out: 2100 [Urine:2100]   Intake/Output this shift:  No intake/output data recorded.  General:  More awake and alert HEENT: Atraumatic external appearance normal Eyes: Pupils round equal reactive Neck: Neck was supple JVP flat Heart: Regular rate and rhythm no murmurs rubs or gallops Lungs: Clear to auscultation no wheeze or rales Abdomen: Soft nontender bowel sounds present Extremities: No cyanosis clubbing or edema Skin: Appears to be sunken with poor turgor Neuro: Alert but not responsive to commands   Basic Metabolic Panel: Recent Labs  Lab 10/27/19 0358  10/27/19 0358 10/27/19 0519 10/27/19 0523 10/27/19 1041 10/28/19 0452 10/29/19 0631  NA 144  --   --  145 147* 147*  150* 135  K 6.8*  --   --  4.8 4.2 3.4*  3.6 3.4*  CL 116*  --   --  120* 115* 110  111 101  CO2 14*  --   --   --  16* 25  25 25   GLUCOSE 89  --   --  86 144* 123*  123* 99  BUN 110*  --   --  92* 99* 79*  81* 59*  CREATININE 3.95*   < > 4.10* 3.90* 3.27* 2.26*  2.19* 1.55*  CALCIUM 9.2   < >  --   --  9.0 8.7*  8.8* 8.1*  MG  --   --   --   --   --   --  1.6*  PHOS  --   --   --   --  4.4 2.0* 1.6*   < > = values in this interval not displayed.    Liver Function Tests: Recent Labs  Lab 10/27/19 0358 10/27/19 1041 10/28/19 0452 10/29/19 0631  AST 40  --  29 33  ALT 31  --  30 31  ALKPHOS 175*  --  127* 108  BILITOT 1.4*  --  0.6 0.6  PROT 6.8  --  5.7* 4.9*  ALBUMIN 3.1* 2.9* 2.6*  2.6* 2.3*   No results for input(s): LIPASE, AMYLASE in the last 168 hours. Recent Labs  Lab 10/27/19 0358  AMMONIA 16  CBC: Recent Labs  Lab 10/27/19 0358 10/27/19 0523 10/27/19 1041 10/28/19 0452 10/29/19 0631  WBC 8.5  --  7.4 9.2 10.1  NEUTROABS 5.9  --   --   --   --   HGB 13.3 14.3 12.1 11.6* 10.4*  HCT 43.1 42.0 38.5 35.9* 32.2*  MCV 91.3  --  89.1 86.3 88.2  PLT 310  --  304 270 234    Cardiac Enzymes: Recent Labs  Lab 10/27/19 0851  CKTOTAL 547*    BNP: Invalid input(s): POCBNP  CBG: Recent Labs  Lab 10/29/19 0509 10/29/19 1200 10/29/19 1638 10/29/19 2330 10/30/19 0632  GLUCAP 97 108* 99 107* 100*    Microbiology: Results for orders placed or performed during the hospital encounter of 10/27/19  Respiratory Panel by RT PCR (Flu A&B, Covid) - Nasopharyngeal Swab     Status: None   Collection Time: 10/27/19  3:58 AM   Specimen: Nasopharyngeal Swab  Result Value Ref Range Status   SARS Coronavirus 2 by RT PCR NEGATIVE NEGATIVE Final    Comment: (NOTE) SARS-CoV-2 target nucleic acids are NOT DETECTED.  The SARS-CoV-2 RNA  is generally detectable in upper respiratoy specimens during the acute phase of infection. The lowest concentration of SARS-CoV-2 viral copies this assay can detect is 131 copies/mL. A negative result does not preclude SARS-Cov-2 infection and should not be used as the sole basis for treatment or other patient management decisions. A negative result may occur with  improper specimen collection/handling, submission of specimen other than nasopharyngeal swab, presence of viral mutation(s) within the areas targeted by this assay, and inadequate number of viral copies (<131 copies/mL). A negative result must be combined with clinical observations, patient history, and epidemiological information. The expected result is Negative.  Fact Sheet for Patients:  PinkCheek.be  Fact Sheet for Healthcare Providers:  GravelBags.it  This test is no t yet approved or cleared by the [place] FDA and  has been authorized for detection and/or diagnosis of SARS-CoV-2 by FDA under an Emergency Use Authorization (EUA). This EUA will remain  in effect (meaning this test can be used) for the duration of the COVID-19 declaration under Section 564(b)(1) of the Act, 21 U.S.C. section 360bbb-3(b)(1), unless the authorization is terminated or revoked sooner.     Influenza A by PCR NEGATIVE NEGATIVE Final   Influenza B by PCR NEGATIVE NEGATIVE Final    Comment: (NOTE) The Xpert Xpress SARS-CoV-2/FLU/RSV assay is intended as an aid in  the diagnosis of influenza from Nasopharyngeal swab specimens and  should not be used as a sole basis for treatment. Nasal washings and  aspirates are unacceptable for Xpert Xpress SARS-CoV-2/FLU/RSV  testing.  Fact Sheet for Patients: PinkCheek.be  Fact Sheet for Healthcare Providers: GravelBags.it  This test is not yet approved or cleared by the [place] FDA and  has been authorized for detection and/or diagnosis of SARS-CoV-2 by  FDA under an Emergency Use Authorization (EUA). This EUA will remain  in effect (meaning this test can be used) for the duration of the  Covid-19 declaration under Section 564(b)(1) of the Act, 21  U.S.C. section 360bbb-3(b)(1), unless the authorization is  terminated or revoked. Performed at [facility], [place] [address] [place], [place] [address]   Urine culture     Status: None   Collection Time: 10/27/19  3:58 AM   Specimen: Urine, Catheterized  Result Value Ref Range Status   Specimen Description   Final    URINE,  CATHETERIZED Performed at [facility] [address], [place], [place] [address]    Special Requests   Final    NONE Performed at [facility], [place] [address] [place], [place] [address]    Culture   Final    NO GROWTH Performed at [facility], [place] [address], [place], [place] [address]    Report Status 10/28/2019 FINAL  Final  Gastrointestinal Panel by PCR , Stool     Status: None   Collection Time: 10/28/19 12:36 PM   Specimen: Stool  Result Value Ref Range Status   Campylobacter species NOT DETECTED NOT DETECTED Final   Plesimonas shigelloides NOT DETECTED NOT DETECTED Final   Salmonella species NOT DETECTED NOT DETECTED Final   Yersinia enterocolitica NOT DETECTED NOT DETECTED Final   Vibrio species NOT DETECTED NOT DETECTED Final   Vibrio cholerae NOT DETECTED NOT DETECTED Final   Enteroaggregative E coli (EAEC) NOT DETECTED NOT DETECTED Final   Enteropathogenic E coli (EPEC) NOT DETECTED NOT DETECTED Final   Enterotoxigenic E coli (ETEC) NOT DETECTED NOT DETECTED Final   Shiga like toxin producing E coli (STEC) NOT DETECTED NOT DETECTED Final   Shigella/Enteroinvasive E coli (EIEC) NOT DETECTED NOT DETECTED Final   Cryptosporidium NOT DETECTED NOT DETECTED Final   Cyclospora cayetanensis NOT DETECTED NOT  DETECTED Final   Entamoeba histolytica NOT DETECTED NOT DETECTED Final   Giardia lamblia NOT DETECTED NOT DETECTED Final   Adenovirus F40/41 NOT DETECTED NOT DETECTED Final   Astrovirus NOT DETECTED NOT DETECTED Final   Norovirus GI/GII NOT DETECTED NOT DETECTED Final   Rotavirus A NOT DETECTED NOT DETECTED Final   Sapovirus (I, II, IV, and V) NOT DETECTED NOT DETECTED Final    Comment: Performed at [facility], [place] [place], [place] [address]  C Difficile Quick Screen w PCR reflex     Status: Abnormal   Collection Time: 10/28/19 12:36 PM   Specimen: Stool  Result Value Ref Range Status   C Diff antigen POSITIVE (A) NEGATIVE Final   C Diff toxin NEGATIVE NEGATIVE Final   C Diff interpretation Results are indeterminate. See PCR results.  Final    Comment: Performed at [facility], [place] [address], [place], [place] [address]  C. Diff by PCR, Reflexed     Status: None   Collection Time: 10/28/19 12:36 PM  Result Value Ref Range Status   Toxigenic C. Difficile by PCR NEGATIVE NEGATIVE Final    Comment: Patient is colonized with non toxigenic C. difficile. May not need treatment unless significant symptoms are present. Performed at [facility], [place] [address], [place], [place] [address]    *Note: Due to a large number of results and/or encounters for the requested time period, some results have not been displayed. A complete set of results can be found in Results Review.    Coagulation Studies: No results for input(s): LABPROT, INR in the last 72 hours.  Urinalysis: No results for input(s): COLORURINE, LABSPEC, PHURINE, GLUCOSEU, HGBUR, BILIRUBINUR, KETONESUR, PROTEINUR, UROBILINOGEN, NITRITE, LEUKOCYTESUR in the last 72 hours.  Invalid input(s): APPERANCEUR    Imaging: No results found.   Medications:   . dextrose 100 mL/hr at 10/30/19 0221   . amLODipine  10 mg Oral Daily  . Chlorhexidine Gluconate Cloth  6 each Topical Daily   . ferrous sulfate  325 mg Oral BID WC  . fluticasone furoate-vilanterol  1 puff Inhalation Daily   Or  .  umeclidinium bromide  1 puff Inhalation Daily  . heparin  5,000 Units Subcutaneous Q8H  . montelukast  10 mg Oral QHS  . oxybutynin  10 mg Oral QHS  . potassium & sodium phosphates  1 packet Oral TID WC & HS  . prazosin  5 mg Oral BID  . saccharomyces boulardii  250 mg Oral BID   albuterol, ipratropium-albuterol, ondansetron **OR** ondansetron (ZOFRAN) IV  Assessment/ Plan:  1.Acute on chronic kidney disease.  Baseline serum creatinine is about 1.5 mg/dL.  Would continue to avoid nephrotoxins nonsteroidal anti-inflammatory drugs ACE inhibitor's or ARB's.  Blood pressure appears to be adequate.  There is no evidence of any hypotension.  CPK mildly increased.  Statins have been discontinued.  Creatinine appears to be returning to baseline.  Patient does have a cystic mass anterior to the bladder but will need a CT scan evaluation 2. Hypertension/volume  -appears volume depleted.  I believe she has had poor p.o. intake.  I think we should hydrate gently.  Will DC D5W at this point 3.  Metabolic acidosis-resolved 4.  Hypokalemia replete 5.  Reported altered mental status patient with head injury with again agree with holding neuro sedative medications.  6.  Hypernatremia resolved.  DC D5W.  Will change to D5 half normal saline 75 cc an 7.  Hypophosphatemia will replete   LOS: [place] @TODAY @8 :11 AM

## 2019-10-30 NOTE — Progress Notes (Signed)
Consulted with infection prevention specialist Golden Pop regarding C.diff antigen results and GI panel results.  Will leave pt on enteric precautions until "loose stools" have resolved. Chantelle Verdi, Laurel Dimmer, RN

## 2019-10-30 NOTE — Plan of Care (Signed)
  Problem: Education: Goal: Knowledge of General Education information will improve Description: Including pain rating scale, medication(s)/side effects and non-pharmacologic comfort measures Outcome: Progressing   Problem: Activity: Goal: Risk for activity intolerance will decrease Outcome: Progressing   Problem: Nutrition: Goal: Adequate nutrition will be maintained Outcome: Progressing   Problem: Coping: Goal: Level of anxiety will decrease Outcome: Progressing   Problem: Pain Managment: Goal: General experience of comfort will improve Outcome: Completed/Met

## 2019-10-30 NOTE — Progress Notes (Signed)
PT Cancellation Note  Patient Details Name: MARCENE LASKOWSKI MRN: [id number] DOB: 18-Jan-1960   Cancelled Treatment:    Reason Eval/Treat Not Completed: Pain limiting ability to participate;Fatigue/lethargy limiting ability to participate (pt reports she can't tolerate activity at present 2* abdminal pain and fatigue from sleeping poorly. Will follow.)   [name] PT 10/30/2019  Acute Rehabilitation Services Pager [phone] Office [phone]

## 2019-10-30 NOTE — Plan of Care (Signed)

## 2019-10-31 DIAGNOSIS — G9341 Metabolic encephalopathy: Secondary | ICD-10-CM | POA: Diagnosis not present

## 2019-10-31 DIAGNOSIS — N179 Acute kidney failure, unspecified: Secondary | ICD-10-CM | POA: Diagnosis not present

## 2019-10-31 LAB — GLUCOSE, CAPILLARY
Glucose-Capillary: 100 mg/dL — ABNORMAL HIGH (ref 70–99)
Glucose-Capillary: 101 mg/dL — ABNORMAL HIGH (ref 70–99)
Glucose-Capillary: 105 mg/dL — ABNORMAL HIGH (ref 70–99)
Glucose-Capillary: 106 mg/dL — ABNORMAL HIGH (ref 70–99)

## 2019-10-31 LAB — CBC WITH DIFFERENTIAL/PLATELET
Abs Immature Granulocytes: 0.82 10*3/uL — ABNORMAL HIGH (ref 0.00–0.07)
Basophils Absolute: 0.1 10*3/uL (ref 0.0–0.1)
Basophils Relative: 1 %
Eosinophils Absolute: 0 10*3/uL (ref 0.0–0.5)
Eosinophils Relative: 0 %
HCT: 32.4 % — ABNORMAL LOW (ref 36.0–46.0)
Hemoglobin: 10.4 g/dL — ABNORMAL LOW (ref 12.0–15.0)
Immature Granulocytes: 8 %
Lymphocytes Relative: 16 %
Lymphs Abs: 1.6 10*3/uL (ref 0.7–4.0)
MCH: 28 pg (ref 26.0–34.0)
MCHC: 32.1 g/dL (ref 30.0–36.0)
MCV: 87.3 fL (ref 80.0–100.0)
Monocytes Absolute: 0.4 10*3/uL (ref 0.1–1.0)
Monocytes Relative: 4 %
Neutro Abs: 7.2 10*3/uL (ref 1.7–7.7)
Neutrophils Relative %: 71 %
Platelets: 259 10*3/uL (ref 150–400)
RBC: 3.71 MIL/uL — ABNORMAL LOW (ref 3.87–5.11)
RDW: 15.2 % (ref 11.5–15.5)
WBC: 10.1 10*3/uL (ref 4.0–10.5)
nRBC: 0 % (ref 0.0–0.2)

## 2019-10-31 LAB — RENAL FUNCTION PANEL
Albumin: 2.5 g/dL — ABNORMAL LOW (ref 3.5–5.0)
Anion gap: 10 (ref 5–15)
BUN: 22 mg/dL — ABNORMAL HIGH (ref 6–20)
CO2: 23 mmol/L (ref 22–32)
Calcium: 8.3 mg/dL — ABNORMAL LOW (ref 8.9–10.3)
Chloride: 105 mmol/L (ref 98–111)
Creatinine, Ser: 1.19 mg/dL — ABNORMAL HIGH (ref 0.44–1.00)
GFR, Estimated: 50 mL/min — ABNORMAL LOW (ref >60)
Glucose, Bld: 103 mg/dL — ABNORMAL HIGH (ref 70–99)
Phosphorus: 3.7 mg/dL (ref 2.5–4.6)
Potassium: 3.5 mmol/L (ref 3.5–5.1)
Sodium: 138 mmol/L (ref 135–145)

## 2019-10-31 LAB — PHOSPHORUS: Phosphorus: 3.6 mg/dL (ref 2.5–4.6)

## 2019-10-31 MED ORDER — METRONIDAZOLE 500 MG PO TABS
500.0000 mg | ORAL_TABLET | Freq: Three times a day (TID) | ORAL | Status: DC
  Administered 2019-10-31 – 2019-11-02 (×7): 500 mg via ORAL
  Filled 2019-10-31 (×7): qty 1

## 2019-10-31 MED ORDER — POTASSIUM CHLORIDE CRYS ER 20 MEQ PO TBCR
40.0000 meq | EXTENDED_RELEASE_TABLET | Freq: Once | ORAL | Status: AC
  Administered 2019-10-31: 40 meq via ORAL
  Filled 2019-10-31: qty 2

## 2019-10-31 MED ORDER — DICYCLOMINE HCL 10 MG PO CAPS
10.0000 mg | ORAL_CAPSULE | Freq: Three times a day (TID) | ORAL | Status: AC
  Administered 2019-10-31 – 2019-11-02 (×8): 10 mg via ORAL
  Filled 2019-10-31 (×8): qty 1

## 2019-10-31 NOTE — TOC Progression Note (Signed)
Transition of Care [facility]) - Progression Note    Patient Details  Name: Tiffany [name] MRN: [id number] Date of Birth: 17-Jun-1960  Transition of Care [facility]) CM/SW Contact  Purcell Mouton, RN Phone Number: 10/31/2019, 3:32 PM  Clinical Narrative:     Pt declined going to SNF for Rehab. Pt states that she can not give up her check right now because she is trying to move.    Expected Discharge Plan: [place] Barriers to Discharge: No Barriers Identified  Expected Discharge Plan and Services Expected Discharge Plan: [place] arrangements for the past 2 months: Single Family Home                                       Social Determinants of Health (SDOH) Interventions    Readmission Risk Interventions No flowsheet data found.

## 2019-10-31 NOTE — TOC Progression Note (Addendum)
Transition of Care [facility]) - Progression Note    Patient Details  Name: Tiffany [name] MRN: [id number] Date of Birth: [birth year]  Transition of Care [facility]) CM/SW Contact  Purcell Mouton, RN Phone Number: 10/31/2019, 11:53 AM  Clinical Narrative:    Spoke with pt concerning discharge plan. Pt states that she is moving to her own apartment next month and that she was active with [facility] at one time and will call to continue with that agency for in house NA. Pt asked for the telephone number for [facility]. Telephone number was given to her.  Encouraged pt to work with PT related to knowing if she will need PT at discharge. Explained that her health insurance look for PT notes. Pt states that she was talking to her mother on the telephone. Asked pt how old her mother was, pt answered 66 and she is mean. Pt states that she did not want anyone called or to know that she was here in the hospital. Pt states that she will work with PT. At present time, pt will return to her mother's house until her apartment is ready.   Expected Discharge Plan: [place] Barriers to Discharge: No Barriers Identified  Expected Discharge Plan and Services Expected Discharge Plan: [place] arrangements for the past 2 months: Single Family Home                                       Social Determinants of Health (SDOH) Interventions    Readmission Risk Interventions No flowsheet data found.
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
## 2019-10-31 NOTE — Evaluation (Signed)
Physical Therapy Evaluation Patient Details Name: Tiffany [name] MRN: [id number] DOB: 1960/07/02 Today's Date: 10/31/2019   History of Present Illness  59 year old female with history of hypertension on amlodipine, hyperlipidemia, and anemia, CKDIIIb baseline creatinine 1.5-1.8 diabetes, COPD and found to have altered mental status minimally responding, work-up showed acute kidney injury, metabolic acidosis, hyperkalemia.  Clinical Impression  Pt admitted with above diagnosis. Pt currently with functional limitations due to the deficits listed below (see PT Problem List). Pt will benefit from skilled PT to increase their independence and safety with mobility to allow discharge to the venue listed below.  Pt agreeable to mobilize however ambulated only short distance due to back and bil LEs pain.  Pt reports hx of falls and using w/c in the past however currently states she has no equipment at home and no one to assist her. Recommend d/c to SNF at this time.     Follow Up Recommendations SNF    Equipment Recommendations  Rolling walker with 5" wheels    Recommendations for Other Services       Precautions / Restrictions Precautions Precautions: Fall      Mobility  Bed Mobility Overal bed mobility: Needs Assistance Bed Mobility: Supine to Sit     Supine to sit: Supervision        Transfers Overall transfer level: Needs assistance Equipment used: Rolling walker (2 wheeled) Transfers: Sit to/from Stand Sit to Stand: Min assist         General transfer comment: verbal cues for safe technique  Ambulation/Gait Ambulation/Gait assistance: Min assist Gait Distance (Feet): 10 Feet Assistive device: Rolling walker (2 wheeled) Gait Pattern/deviations: Step-through pattern;Decreased stride length     General Gait Details: pt initiated ambulation however reports back and bil leg pain, pt resting forearms on RW at times due to pain, limited distance  Stairs             Wheelchair Mobility    Modified Rankin (Stroke Patients Only)       Balance Overall balance assessment: Needs assistance         Standing balance support: No upper extremity supported Standing balance-Leahy Scale: Fair Standing balance comment: static fair                             Pertinent Vitals/Pain Pain Assessment: Faces Faces Pain Scale: Hurts even more Pain Location: back and bil LEs Pain Descriptors / Indicators: Grimacing;Shooting Pain Intervention(s): Monitored during session;Repositioned    Home Living Family/patient expects to be discharged to:: Private residence Living Arrangements: Parent   Type of Home: House Home Access: Stairs to enter   Technical brewer of Steps: did not state amount Home Layout: One level   Additional Comments: pt reports she used to have a w/c, states she has no equipment at home currently    Prior Function Level of Independence: Independent               Hand Dominance        Extremity/Trunk Assessment        Lower Extremity Assessment Lower Extremity Assessment: Generalized weakness    Cervical / Trunk Assessment Cervical / Trunk Assessment: Normal  Communication   Communication: No difficulties  Cognition Arousal/Alertness: Awake/alert Behavior During Therapy: WFL for tasks assessed/performed Overall Cognitive Status: No family/caregiver present to determine baseline cognitive functioning  General Comments: hx anoxic brain injury      General Comments      Exercises     Assessment/Plan    PT Assessment Patient needs continued PT services  PT Problem List Decreased strength;Decreased mobility;Decreased activity tolerance;Decreased knowledge of use of DME;Decreased balance       PT Treatment Interventions Therapeutic exercise;DME instruction;Gait training;Balance training;Functional mobility training;Therapeutic  activities;Patient/family education;Neuromuscular re-education    PT Goals (Current goals can be found in the Care Plan section)  Acute Rehab PT Goals PT Goal Formulation: With patient Time For Goal Achievement: 11/14/19 Potential to Achieve Goals: Good    Frequency Min 2X/week   Barriers to discharge        Co-evaluation               AM-PAC PT "6 Clicks" Mobility  Outcome Measure Help needed turning from your back to your side while in a flat bed without using bedrails?: A Little Help needed moving from lying on your back to sitting on the side of a flat bed without using bedrails?: A Little Help needed moving to and from a bed to a chair (including a wheelchair)?: A Little Help needed standing up from a chair using your arms ([name].g., wheelchair or bedside chair)?: A Little Help needed to walk in hospital room?: A Little Help needed climbing 3-5 steps with a railing? : A Lot 6 Click Score: 17    End of Session Equipment Utilized During Treatment: Gait belt Activity Tolerance: Patient limited by pain Patient left: with call bell/phone within reach;with chair alarm set;in chair Nurse Communication: Mobility status PT Visit Diagnosis: Other abnormalities of gait and mobility (R26.89)    Time: 5537-4827 PT Time Calculation (min) (ACUTE ONLY): 20 min   Charges:   PT Evaluation $PT Eval Low Complexity: 1 Low     Kati PT, DPT Acute Rehabilitation Services Pager: [phone] Office: [phone]  Tiffany [name] 10/31/2019, 1:53 PM

## 2019-10-31 NOTE — Progress Notes (Signed)
Tiffany [name]   Subjective:   Brief history this 59 year old lady with a baseline serum creatinine 1.5 to 1.8 mg/dL.  She has a history of hypertension diabetes and COPD.  She presents with altered mental status.  She has a history of hypoxic brain injury with diagnosis schizoaffective to hold and uses multiple neuro sedative medications.  Urine output 2 L 10/30/2019  Blood pressure 139/97 pulse 75 temperature 99.1 O2 sats 97% room air  Sodium 138 potassium 3.5 chloride 105 CO2 23 BUN 22 creatinine 1.19 glucose 103 calcium 8.3 hemoglobin 10.4  Urine 0 RBCs per high-power from 0 WBCs per high-power from  CT scan showed a 7.7 cm cystic structure within the left adnexa  Amlodipine 10 mg daily Singulair 10 mg daily Ditropan 10 mg nightly Minipress 5 mg twice daily  Objective:  Vital signs in last 24 hours:  Temp:  [98.3 F (36.8 C)-99.1 F (37.3 C)] 99.1 F (37.3 C) (10/15 0500) Pulse Rate:  [59-75] 75 (10/15 0500) Resp:  [18-22] 18 (10/15 0500) BP: (124-139)/(79-97) 139/97 (10/15 0500) SpO2:  [94 %-99 %] 97 % (10/15 0500) Weight:  [71.5 kg] 71.5 kg (10/14 0819)  Weight change:  Filed Weights   10/30/19 0819  Weight: 71.5 kg    Intake/Output: I/O last 3 completed shifts: In: 1287.9 [I.V.:1287.9] Out: 2125 [Urine:2125]   Intake/Output this shift:  No intake/output data recorded.  General:  More awake and alert HEENT: Atraumatic external appearance normal Eyes: Pupils round equal reactive Neck: Neck was supple JVP flat Heart: Regular rate and rhythm no murmurs rubs or gallops Lungs: Clear to auscultation no wheeze or rales Abdomen: Soft nontender bowel sounds present Extremities: No cyanosis clubbing or edema Skin: Appears to be sunken with poor turgor Neuro: Alert but not responsive to commands   Basic Metabolic Panel: Recent Labs  Lab 10/27/19 1041 10/27/19 1041 10/28/19 0452 10/28/19 0452 10/29/19 0631 10/30/19 0820  10/31/19 0420  NA 147*  --  147*  150*  --  135 133* 138  K 4.2  --  3.4*  3.6  --  3.4* 3.1* 3.5  CL 115*  --  110  111  --  101 101 105  CO2 16*  --  25  25  --  25 23 23   GLUCOSE 144*  --  123*  123*  --  99 97 103*  BUN 99*  --  79*  81*  --  59* 36* 22*  CREATININE 3.27*  --  2.26*  2.19*  --  1.55* 1.35* 1.19*  CALCIUM 9.0   < > 8.7*  8.8*   < > 8.1* 8.1* 8.3*  MG  --   --   --   --  1.6*  --   --   PHOS 4.4  --  2.0*  --  1.6* 2.2*  2.2* 3.7  3.6   < > = values in this interval not displayed.    Liver Function Tests: Recent Labs  Lab 10/27/19 0358 10/27/19 0358 10/27/19 1041 10/28/19 0452 10/29/19 0631 10/30/19 0820 10/31/19 0420  AST 40  --   --  29 33  --   --   ALT 31  --   --  30 31  --   --   ALKPHOS 175*  --   --  127* 108  --   --   BILITOT 1.4*  --   --  0.6 0.6  --   --   PROT 6.8  --   --  5.7* 4.9*  --   --   ALBUMIN 3.1*   < > 2.9* 2.6*  2.6* 2.3* 2.5* 2.5*   < > = values in this interval not displayed.   No results for input(s): LIPASE, AMYLASE in the last 168 hours. Recent Labs  Lab 10/27/19 0358  AMMONIA 16    CBC: Recent Labs  Lab 10/27/19 0358 10/27/19 0358 10/27/19 0523 10/27/19 1041 10/28/19 0452 10/29/19 0631 10/31/19 0420  WBC 8.5  --   --  7.4 9.2 10.1 10.1  NEUTROABS 5.9  --   --   --   --   --  7.2  HGB 13.3   < > 14.3 12.1 11.6* 10.4* 10.4*  HCT 43.1   < > 42.0 38.5 35.9* 32.2* 32.4*  MCV 91.3  --   --  89.1 86.3 88.2 87.3  PLT 310  --   --  304 270 234 259   < > = values in this interval not displayed.    Cardiac Enzymes: Recent Labs  Lab 10/27/19 0851  CKTOTAL 547*    BNP: Invalid input(s): POCBNP  CBG: Recent Labs  Lab 10/30/19 0632 10/30/19 1138 10/30/19 1725 10/30/19 2358 10/31/19 0554  GLUCAP 100* 108* 94 95 101*    Microbiology: Results for orders placed or performed during the hospital encounter of 10/27/19  Respiratory Panel by RT PCR (Flu A&B, Covid) - Nasopharyngeal Swab     Status:  None   Collection Time: 10/27/19  3:58 AM   Specimen: Nasopharyngeal Swab  Result Value Ref Range Status   SARS Coronavirus 2 by RT PCR NEGATIVE NEGATIVE Final    Comment: ([name]) SARS-CoV-2 target nucleic acids are NOT DETECTED.  The SARS-CoV-2 RNA is generally detectable in upper respiratoy specimens during the acute phase of infection. The lowest concentration of SARS-CoV-2 viral copies this assay can detect is 131 copies/mL. A negative result does not preclude SARS-Cov-2 infection and should not be used as the sole basis for treatment or other patient management decisions. A negative result may occur with  improper specimen collection/handling, submission of specimen other than nasopharyngeal swab, presence of viral mutation(s) within the areas targeted by this assay, and inadequate number of viral copies (<131 copies/mL). A negative result must be combined with clinical observations, patient history, and epidemiological information. The expected result is Negative.  Fact Sheet for Patients:  PinkCheek.be  Fact Sheet for Healthcare Providers:  GravelBags.it  This test is no t yet approved or cleared by the [place] FDA and  has been authorized for detection and/or diagnosis of SARS-CoV-2 by FDA under an Emergency Use Authorization (EUA). This EUA will remain  in effect (meaning this test can be used) for the duration of the COVID-19 declaration under Section 564(b)(1) of the Act, 21 U.S.C. section 360bbb-3(b)(1), unless the authorization is terminated or revoked sooner.     Influenza A by PCR NEGATIVE NEGATIVE Final   Influenza B by PCR NEGATIVE NEGATIVE Final    Comment: ([name]) The Xpert Xpress SARS-CoV-2/FLU/RSV assay is intended as an aid in  the diagnosis of influenza from Nasopharyngeal swab specimens and  should not be used as a sole basis for treatment. Nasal washings and  aspirates are unacceptable for  Xpert Xpress SARS-CoV-2/FLU/RSV  testing.  Fact Sheet for Patients: PinkCheek.be  Fact Sheet for Healthcare Providers: GravelBags.it  This test is not yet approved or cleared by the [place] FDA and  has been authorized for detection and/or diagnosis of SARS-CoV-2 by  FDA  under an Emergency Use Authorization (EUA). This EUA will remain  in effect (meaning this test can be used) for the duration of the  Covid-19 declaration under Section 564(b)(1) of the Act, 21  U.S.C. section 360bbb-3(b)(1), unless the authorization is  terminated or revoked. Performed at [facility], [place] [address], [place], [place] [address]   Urine culture     Status: None   Collection Time: 10/27/19  3:58 AM   Specimen: Urine, Catheterized  Result Value Ref Range Status   Specimen Description   Final    URINE, CATHETERIZED Performed at [facility] [address] [place], [place] [address]    Special Requests   Final    NONE Performed at [facility], [place] [address] [place], [place] [address]    Culture   Final    NO GROWTH Performed at [facility], [place] [address], [place], [place] [address]    Report Status 10/28/2019 FINAL  Final  Gastrointestinal Panel by PCR , Stool     Status: None   Collection Time: 10/28/19 12:36 PM   Specimen: Stool  Result Value Ref Range Status   Campylobacter species NOT DETECTED NOT DETECTED Final   Plesimonas shigelloides NOT DETECTED NOT DETECTED Final   Salmonella species NOT DETECTED NOT DETECTED Final   Yersinia enterocolitica NOT DETECTED NOT DETECTED Final   Vibrio species NOT DETECTED NOT DETECTED Final   Vibrio cholerae NOT DETECTED NOT DETECTED Final   Enteroaggregative E coli (EAEC) NOT DETECTED NOT DETECTED Final   Enteropathogenic E coli (EPEC) NOT DETECTED NOT DETECTED Final   Enterotoxigenic E coli (ETEC) NOT DETECTED NOT  DETECTED Final   Shiga like toxin producing E coli (STEC) NOT DETECTED NOT DETECTED Final   Shigella/Enteroinvasive E coli (EIEC) NOT DETECTED NOT DETECTED Final   Cryptosporidium NOT DETECTED NOT DETECTED Final   Cyclospora cayetanensis NOT DETECTED NOT DETECTED Final   Entamoeba histolytica NOT DETECTED NOT DETECTED Final   Giardia lamblia NOT DETECTED NOT DETECTED Final   Adenovirus F40/41 NOT DETECTED NOT DETECTED Final   Astrovirus NOT DETECTED NOT DETECTED Final   Norovirus GI/GII NOT DETECTED NOT DETECTED Final   Rotavirus A NOT DETECTED NOT DETECTED Final   Sapovirus (I, II, IV, and V) NOT DETECTED NOT DETECTED Final    Comment: Performed at [facility], [place] [place], [place] [address]  C Difficile Quick Screen w PCR reflex     Status: Abnormal   Collection Time: 10/28/19 12:36 PM   Specimen: Stool  Result Value Ref Range Status   C Diff antigen POSITIVE (A) NEGATIVE Final   C Diff toxin NEGATIVE NEGATIVE Final   C Diff interpretation Results are indeterminate. See PCR results.  Final    Comment: Performed at [facility], [place] [address], [place], [place] [address]  C. Diff by PCR, Reflexed     Status: None   Collection Time: 10/28/19 12:36 PM  Result Value Ref Range Status   Toxigenic C. Difficile by PCR NEGATIVE NEGATIVE Final    Comment: Patient is colonized with non toxigenic C. difficile. May not need treatment unless significant symptoms are present. Performed at [facility], [place] [address] [place], [place] [address]    *[name]: Due to a large number of results and/or encounters for the requested time period, some results have not been displayed. A complete set of results can be found in Results Review.    Coagulation  Studies: No results for input(s): LABPROT, INR in the last 72 hours.  Urinalysis: No results for input(s): COLORURINE, LABSPEC, PHURINE, GLUCOSEU, HGBUR, BILIRUBINUR, KETONESUR, PROTEINUR, UROBILINOGEN,  NITRITE, LEUKOCYTESUR in the last 72 hours.  Invalid input(s): APPERANCEUR    Imaging: CT ABDOMEN PELVIS W WO CONTRAST  Addendum Date: 10/30/2019   ADDENDUM REPORT: 10/30/2019 15:51 ADDENDUM: The following is a correction to the original report under the reproductive and impression portion: Reproductive: The uterus appears surgically absent. Corresponding to the ultrasound abnormality there is a 7.7 by 7.2 by 6.5 cm cystic structure, image 64/2. No internal enhancing septation or mural nodule identified. IMPRESSION: 1. Corresponding to the ultrasound abnormality is a 7.7 cm cystic structure within the left adnexa. On the postcontrast images there are no internal areas of enhancing septation or mural nodule. Based on the CT appearance findings are low risk. Recommend referral to gynecology for further management. 2. There is mild wall thickening involving the descending colon and sigmoid colon. Correlate for signs or symptoms of colitis. 3. Gallstones. 4. Left adrenal gland adenoma. 5. Aortic atherosclerosis. Electronically Signed   By: Kerby Moors M.D.   On: 10/30/2019 15:51   Result Date: 10/30/2019 CLINICAL DATA:  Evaluate pelvic cyst. EXAM: CT ABDOMEN AND PELVIS WITHOUT AND WITH CONTRAST TECHNIQUE: Multidetector CT imaging of the abdomen and pelvis was performed following the standard protocol before and following the bolus administration of intravenous contrast. CONTRAST:  123mL OMNIPAQUE IOHEXOL 300 MG/ML  SOLN COMPARISON:  Ultrasound 10/27/2019. FINDINGS: Lower chest: No acute abnormality. Hepatobiliary: Normal appearance of the liver. Small stones are noted layering within the gallbladder measuring up to 3 mm. No signs of gallbladder inflammation or bile duct dilatation. Pancreas: Unremarkable. No pancreatic ductal dilatation or surrounding inflammatory changes. Spleen: Normal in size without focal abnormality. Adrenals/Urinary Tract: There is a 9 mm low-density nodule in the left adrenal gland  measuring -6 Hounsfield units. This is compatible with a benign adenoma. The kidneys are unremarkable. No kidney stone or hydronephrosis identified. Urinary bladder is partially collapsed around a Foley catheter balloon. Stomach/Bowel: Stomach is normal. No dilated loops of small or large bowel. The appendix is visualized and appears normal. There is wall thickening involving the descending colon, distal sigmoid colon. Mild pericolonic soft tissue stranding is noted. No pneumatosis or signs of bowel perforation. Vascular/Lymphatic: Aortic atherosclerosis. No aneurysm. No abdominopelvic adenopathy. Reproductive: The uterus appears surgically absent. Corresponding to the ultrasound abnormality there is a 7.7 by 7.2 by 6.5 cm cystic structure, image 64/2. Within the limitations of unenhanced technique no mural nodule or septation noted. Other: No free fluid or fluid collections. Musculoskeletal: No acute or significant osseous findings. Multilevel degenerative disc disease noted. IMPRESSION: 1. Corresponding to the ultrasound abnormality is a 7.7 cm cystic structure within the left adnexa. Within the limitations of unenhanced technique no mural nodule or septation noted. Because this lesion is not adequately characterized, prompt [name] is recommended for further evaluation. [name]: This recommendation does not apply to premenarchal patients and to those with increased risk (genetic, family history, elevated tumor markers or other high-risk factors) of ovarian cancer. Reference: JACR 2020 Feb; 17(2):248-254 2. There is mild wall thickening involving the descending colon and sigmoid colon. Correlate for signs or symptoms of colitis. 3. Gallstones. 4. Left adrenal gland adenoma. 5. Aortic atherosclerosis. Aortic Atherosclerosis (ICD10-I70.0). Electronically Signed: By: Kerby Moors M.D. On: 10/30/2019 12:54     Medications:   . dextrose 5 % and 0.45% NaCl 75 mL/hr at 10/31/19 0004   .  amLODipine  10 mg Oral Daily  .  asenapine  10 mg Sublingual BID  . Chlorhexidine Gluconate Cloth  6 each Topical Daily  . ferrous sulfate  325 mg Oral BID WC  . fluticasone furoate-vilanterol  1 puff Inhalation Daily   Or  . umeclidinium bromide  1 puff Inhalation Daily  . gabapentin  100 mg Oral BID  . heparin  5,000 Units Subcutaneous Q8H  . montelukast  10 mg Oral QHS  . oxybutynin  10 mg Oral QHS  . potassium & sodium phosphates  1 packet Oral TID WC & HS  . prazosin  5 mg Oral BID  . saccharomyces boulardii  250 mg Oral BID  . vancomycin  125 mg Oral QID   ipratropium-albuterol, liver oil-zinc oxide, ondansetron **OR** ondansetron (ZOFRAN) IV  Assessment/ Plan:  1.Acute on chronic kidney disease.  Baseline serum creatinine is about 1.5 mg/dL.  Would continue to avoid nephrotoxins nonsteroidal anti-inflammatory drugs ACE inhibitor's or ARB's.  Blood pressure appears to be adequate.  There is no evidence of any hypotension.  CPK mildly increased.  Statins have been discontinued.  Creatinine appears to be returning to baseline.  Will sign off at this point. 2. Hypertension/volume  -appears volume depleted.  I believe she has had poor p.o. intake.  I think we should hydrate gently.  Will DC D5W at this point 3.  Metabolic acidosis-resolved 4.  Hypokalemia replete 5.  Reported altered mental status patient with head injury with again agree with holding neuro sedative medications.  6.  Hypernatremia resolved.  DC D5W.  Will change to D5 half normal saline 75 cc an 7.  Hypophosphatemia will replete 8.  Cystic mass in left ovary.  Await evaluation by OB/GYN   LOS: [place] @TODAY @7 :27 AM

## 2019-10-31 NOTE — TOC Progression Note (Signed)
Transition of Care [facility]) - Progression Note    Patient Details  Name: Tiffany [name] MRN: [id number] Date of Birth: 1960/09/12  Transition of Care [facility]) CM/SW Contact  Purcell Mouton, RN Phone Number: 10/31/2019, 4:06 PM  Clinical Narrative:     Pt received walker in June unable to get another walker unless pt pay for it. [employer] in house rep calling pt.   Expected Discharge Plan: [place] Barriers to Discharge: No Barriers Identified  Expected Discharge Plan and Services Expected Discharge Plan: [place] arrangements for the past 2 months: Single Family Home                                       Social Determinants of Health (SDOH) Interventions    Readmission Risk Interventions No flowsheet data found.

## 2019-10-31 NOTE — Progress Notes (Signed)
PROGRESS NOTE    Tiffany [name]  IRC:[id number] DOB: 1961/01/06 DOA: 10/27/2019 PCP: [name], DO    Chief Complaint  Patient presents with  . Altered Mental Status    Brief Narrative:  59 year old female with history of hypertension on amlodipine, hyperlipidemia, and anemia, CKDIIIb baseline creatinine 1.5-1.8 diabetes, COPD brought to the ED with lethargy/altered mental status.  On presentation patient unable to participate in the family available unable to contact so history is limited. Apparently she was found increasingly confused and weak, family became concerned and called EMS.  Had recent PCP visit 10/22/2019 for diarrhea "not feeling well", advised supportive care.  There has been no report of diarrhea as a consult for this visit.  In ED,Patient was found to have altered mental status minimally responding, work-up showed acute kidney injury, metabolic acidosis, hyperkalemia.  No acute significant finding. Nephrology was consulted.  Subjective:  Continue to complain of stomach pain, continue to have watery diarrhea, no fever, no nausea no vomiting [name] fully alert, interactive,  She demonstrate paranoid behavior , tangential thought process with intermittent agitation  Assessment & Plan:   Active Problems:   Schizoaffective disorder, bipolar type ([place])   ARF (acute renal failure) (HCC)   Pressure injury of skin  AKI on CKD IIIb/metabolic acidosis, Creatinine on presentation 4.1, baseline creatinine 1.5-1.8: CK slightly elevated at 547. Urine culture no growth Renal ultrasound no obstructive nephropathy  Appreciate nephrology input, She received IV fluids,  bicarb  Drip  Creatinine improving, now back to baseline avoid NSAID medication ACE or ARB.  cystic structure within the left adnexa: -Incidental finding on renal ultrasound showed 8 cm cystic mass anterior to bladder   -CT with IV contrast ordered when okay with nephrology which showed  "Corresponding to the ultrasound abnormality is a 7.7 cm cystic structure within the left adnexa. On the postcontrast images there are no internal areas of enhancing septation or mural nodule. Based on the CT appearance findings are low risk. Recommend referral to gynecology for further management."  Acute encephalopathy likely metabolic/uremic:   CT head No acute finding.  LFTs and ammonia are stable.  UA no evidence of UTI, Chest x-ray hypoinflation with mild hazy left midlung opacity may relate atelectasis or pneumonia but patient without fever or leukocytosis, procal altho up 6.2-low suspicion for infection.UDSpositive for benzo.  COVID-19 screen negative Tylenol level salicylate level undetectable.  TSH 1.2 seems to be resolving.   Continue to have diarrhea with c/o left lower quadrant ab pain  C. difficile antigen positive, toxin negative, toxigenic PCR negative, however she continues have diarrhea and complaining of left lower quadrant pain CT abdomen today showed mild wall thickening involving descending colon and sigmoid colon, started oral vancomycin on 10/14, add on flagyl now that k has normalized  Hypokalemia/hypophosphotemia, replaced, normalized , monitor .   Hypomagnesemia, report h/o mag allergy  Hypertension: BP stable.    [name] on prazosin twice daily at home which is continued,  Blood pressure low normal, will hold Norvasc ,  hold metolazone  Prediabetes with hemoglobin A1c 5.6, monitor CBG  Neuropathy on Neurontin , Neurontin held due to AKI , renal function improving,  started back on  low-dose Neurontin soon  Gout- hold colchicine , due to AKI  COPD/chronic respiratory failure with hypoxia/hypercapnia/OSA: Followed by [facility], on 2 L nasal cannula baseline, also has been prescribed a trilogy ventilator usee every night.Cont same, cont nebs prn.  H/o hypoxic brain injury h/o schizoaffective; she demonstrates  paranoid behavior with intermittent  hallucination and aggression  psychiatry consulted, input appreciated,detail please refer to psychiatry consult note on October 14  FTT : Pt eval Skilled nursing facility, however she declined, will arrange home health  DVT prophylaxis: heparin injection 5,000 Units Start: 10/27/19 0930   Code Status: Full Family Communication: She was talking on the phone with her mother today when I went to her room, however she declined my offer to talk to her mother, mother's contact information is not on file  Disposition:   Status is: Inpatient  Dispo: The patient is from: Home              Anticipated d/c is to:  Home with home health, she declined snf placement              Anticipated d/c date is: 24 to 48 hours if diarrhea slows done and improvement of ab pain                Consultants:   Nephrology  Psychiatry  Procedures:   None  Antimicrobials:   None     Objective: Vitals:   10/30/19 1254 10/30/19 2009 10/31/19 0500 10/31/19 1206  BP: 124/79 129/84 (!) 139/97 106/64  Pulse: 74 (!) 59 75 94  Resp: (!) 22 18 18 16   Temp: 98.3 F (36.8 C) 98.5 F (36.9 C) 99.1 F (37.3 C) 98.9 F (37.2 C)  TempSrc: Oral Oral Oral Oral  SpO2: 94% 97% 97% 94%  Weight:      Height:        Intake/Output Summary (Last 24 hours) at 10/31/2019 1528 Last data filed at 10/31/2019 1521 Gross per 24 hour  Intake 2052.08 ml  Output --  Net 2052.08 ml   Filed Weights   10/30/19 0819  Weight: 71.5 kg    Examination:  General exam; AAOx3, alert and interactive, paranoia Respiratory system: Clear to auscultation. Respiratory effort normal. Cardiovascular system: S1 & S2 heard, RRR. No JVD, no murmur, No pedal edema. Gastrointestinal system: Abdomen is nondistended, soft , left lower quadrant tenderness, no guarding, no rebound,   Normal bowel sounds heard. Central nervous system: Alert and oriented x3, moving all extremities Extremities: Generalized weakness, no edema Skin: No  rashes, lesions or ulcers Psychiatry:  interactive, intermittent agitation, intermittent hallucination, tangential thought process, paranoid behavior    Data Reviewed: I have personally reviewed following labs and imaging studies  CBC: Recent Labs  Lab 10/27/19 0358 10/27/19 0358 10/27/19 0523 10/27/19 1041 10/28/19 0452 10/29/19 0631 10/31/19 0420  WBC 8.5  --   --  7.4 9.2 10.1 10.1  NEUTROABS 5.9  --   --   --   --   --  7.2  HGB 13.3   < > 14.3 12.1 11.6* 10.4* 10.4*  HCT 43.1   < > 42.0 38.5 35.9* 32.2* 32.4*  MCV 91.3  --   --  89.1 86.3 88.2 87.3  PLT 310  --   --  304 270 234 259   < > = values in this interval not displayed.    Basic Metabolic Panel: Recent Labs  Lab 10/27/19 1041 10/28/19 0452 10/29/19 0631 10/30/19 0820 10/31/19 0420  NA 147* 147*  150* 135 133* 138  K 4.2 3.4*  3.6 3.4* 3.1* 3.5  CL 115* 110  111 101 101 105  CO2 16* 25  25 25 23 23   GLUCOSE 144* 123*  123* 99 97 103*  BUN 99* 79*  81* 59* 36*  22*  CREATININE 3.27* 2.26*  2.19* 1.55* 1.35* 1.19*  CALCIUM 9.0 8.7*  8.8* 8.1* 8.1* 8.3*  MG  --   --  1.6*  --   --   PHOS 4.4 2.0* 1.6* 2.2*  2.2* 3.7  3.6    GFR: Estimated Creatinine Clearance: 46.6 mL/min (A) (by C-G formula based on SCr of 1.19 mg/dL (H)).  Liver Function Tests: Recent Labs  Lab 10/27/19 0358 10/27/19 0358 10/27/19 1041 10/28/19 0452 10/29/19 0631 10/30/19 0820 10/31/19 0420  AST 40  --   --  29 33  --   --   ALT 31  --   --  30 31  --   --   ALKPHOS 175*  --   --  127* 108  --   --   BILITOT 1.4*  --   --  0.6 0.6  --   --   PROT 6.8  --   --  5.7* 4.9*  --   --   ALBUMIN 3.1*   < > 2.9* 2.6*  2.6* 2.3* 2.5* 2.5*   < > = values in this interval not displayed.    CBG: Recent Labs  Lab 10/30/19 1138 10/30/19 1725 10/30/19 2358 10/31/19 0554 10/31/19 1146  GLUCAP 108* 94 95 101* 106*     Recent Results (from the past 240 hour(s))  Respiratory Panel by RT PCR (Flu A&B, Covid) -  Nasopharyngeal Swab     Status: None   Collection Time: 10/27/19  3:58 AM   Specimen: Nasopharyngeal Swab  Result Value Ref Range Status   SARS Coronavirus 2 by RT PCR NEGATIVE NEGATIVE Final    Comment: (NOTE) SARS-CoV-2 target nucleic acids are NOT DETECTED.  The SARS-CoV-2 RNA is generally detectable in upper respiratoy specimens during the acute phase of infection. The lowest concentration of SARS-CoV-2 viral copies this assay can detect is 131 copies/mL. A negative result does not preclude SARS-Cov-2 infection and should not be used as the sole basis for treatment or other patient management decisions. A negative result may occur with  improper specimen collection/handling, submission of specimen other than nasopharyngeal swab, presence of viral mutation(s) within the areas targeted by this assay, and inadequate number of viral copies (<131 copies/mL). A negative result must be combined with clinical observations, patient history, and epidemiological information. The expected result is Negative.  Fact Sheet for Patients:  PinkCheek.be  Fact Sheet for Healthcare Providers:  GravelBags.it  This test is no t yet approved or cleared by the [place] FDA and  has been authorized for detection and/or diagnosis of SARS-CoV-2 by FDA under an Emergency Use Authorization (EUA). This EUA will remain  in effect (meaning this test can be used) for the duration of the COVID-19 declaration under Section 564(b)(1) of the Act, 21 U.S.C. section 360bbb-3(b)(1), unless the authorization is terminated or revoked sooner.     Influenza A by PCR NEGATIVE NEGATIVE Final   Influenza B by PCR NEGATIVE NEGATIVE Final    Comment: (NOTE) The Xpert Xpress SARS-CoV-2/FLU/RSV assay is intended as an aid in  the diagnosis of influenza from Nasopharyngeal swab specimens and  should not be used as a sole basis for treatment. Nasal washings and   aspirates are unacceptable for Xpert Xpress SARS-CoV-2/FLU/RSV  testing.  Fact Sheet for Patients: PinkCheek.be  Fact Sheet for Healthcare Providers: GravelBags.it  This test is not yet approved or cleared by the [place] FDA and  has been authorized for detection and/or diagnosis of SARS-CoV-2  by  FDA under an Emergency Use Authorization (EUA). This EUA will remain  in effect (meaning this test can be used) for the duration of the  Covid-19 declaration under Section 564(b)(1) of the Act, 21  U.S.C. section 360bbb-3(b)(1), unless the authorization is  terminated or revoked. Performed at [facility], [place] [address] [place], [place] [address]   Urine culture     Status: None   Collection Time: 10/27/19  3:58 AM   Specimen: Urine, Catheterized  Result Value Ref Range Status   Specimen Description   Final    URINE, CATHETERIZED Performed at [facility] [address] [place], [place] [address]    Special Requests   Final    NONE Performed at [facility], [place] [address] [place], [place] [address]    Culture   Final    NO GROWTH Performed at [facility], [place] [address], [place], [place] [address]    Report Status 10/28/2019 FINAL  Final  Gastrointestinal Panel by PCR , Stool     Status: None   Collection Time: 10/28/19 12:36 PM   Specimen: Stool  Result Value Ref Range Status   Campylobacter species NOT DETECTED NOT DETECTED Final   Plesimonas shigelloides NOT DETECTED NOT DETECTED Final   Salmonella species NOT DETECTED NOT DETECTED Final   Yersinia enterocolitica NOT DETECTED NOT DETECTED Final   Vibrio species NOT DETECTED NOT DETECTED Final   Vibrio cholerae NOT DETECTED NOT DETECTED Final   Enteroaggregative E coli (EAEC) NOT DETECTED NOT DETECTED Final   Enteropathogenic E coli (EPEC) NOT DETECTED NOT DETECTED Final   Enterotoxigenic E  coli (ETEC) NOT DETECTED NOT DETECTED Final   Shiga like toxin producing E coli (STEC) NOT DETECTED NOT DETECTED Final   Shigella/Enteroinvasive E coli (EIEC) NOT DETECTED NOT DETECTED Final   Cryptosporidium NOT DETECTED NOT DETECTED Final   Cyclospora cayetanensis NOT DETECTED NOT DETECTED Final   Entamoeba histolytica NOT DETECTED NOT DETECTED Final   Giardia lamblia NOT DETECTED NOT DETECTED Final   Adenovirus F40/41 NOT DETECTED NOT DETECTED Final   Astrovirus NOT DETECTED NOT DETECTED Final   Norovirus GI/GII NOT DETECTED NOT DETECTED Final   Rotavirus A NOT DETECTED NOT DETECTED Final   Sapovirus (I, II, IV, and V) NOT DETECTED NOT DETECTED Final    Comment: Performed at [facility], [address] [place], [place] [address]  C Difficile Quick Screen w PCR reflex     Status: Abnormal   Collection Time: 10/28/19 12:36 PM   Specimen: Stool  Result Value Ref Range Status   C Diff antigen POSITIVE (A) NEGATIVE Final   C Diff toxin NEGATIVE NEGATIVE Final   C Diff interpretation Results are indeterminate. See PCR results.  Final    Comment: Performed at [facility], [place] [address], [place], [place] [address]  C. Diff by PCR, Reflexed     Status: None   Collection Time: 10/28/19 12:36 PM  Result Value Ref Range Status   Toxigenic C. Difficile by PCR NEGATIVE NEGATIVE Final    Comment: Patient is colonized with non toxigenic C. difficile. May not need treatment unless significant symptoms are present. Performed at [facility], [place] [address] [place],  [address]          Radiology Studies: CT ABDOMEN PELVIS W WO CONTRAST  Addendum Date: 10/30/2019   ADDENDUM REPORT: 10/30/2019 15:51 ADDENDUM: The following is a correction to the original  report under the reproductive and impression portion: Reproductive: The uterus appears surgically absent. Corresponding to the ultrasound abnormality there is a 7.7 by 7.2 by 6.5 cm cystic structure,  image 64/2. No internal enhancing septation or mural nodule identified. IMPRESSION: 1. Corresponding to the ultrasound abnormality is a 7.7 cm cystic structure within the left adnexa. On the postcontrast images there are no internal areas of enhancing septation or mural nodule. Based on the CT appearance findings are low risk. Recommend referral to gynecology for further management. 2. There is mild wall thickening involving the descending colon and sigmoid colon. Correlate for signs or symptoms of colitis. 3. Gallstones. 4. Left adrenal gland adenoma. 5. Aortic atherosclerosis. Electronically Signed   By: Kerby Moors M.D.   On: 10/30/2019 15:51   Result Date: 10/30/2019 CLINICAL DATA:  Evaluate pelvic cyst. EXAM: CT ABDOMEN AND PELVIS WITHOUT AND WITH CONTRAST TECHNIQUE: Multidetector CT imaging of the abdomen and pelvis was performed following the standard protocol before and following the bolus administration of intravenous contrast. CONTRAST:  125mL OMNIPAQUE IOHEXOL 300 MG/ML  SOLN COMPARISON:  Ultrasound 10/27/2019. FINDINGS: Lower chest: No acute abnormality. Hepatobiliary: Normal appearance of the liver. Small stones are noted layering within the gallbladder measuring up to 3 mm. No signs of gallbladder inflammation or bile duct dilatation. Pancreas: Unremarkable. No pancreatic ductal dilatation or surrounding inflammatory changes. Spleen: Normal in size without focal abnormality. Adrenals/Urinary Tract: There is a 9 mm low-density nodule in the left adrenal gland measuring -6 Hounsfield units. This is compatible with a benign adenoma. The kidneys are unremarkable. No kidney stone or hydronephrosis identified. Urinary bladder is partially collapsed around a Foley catheter balloon. Stomach/Bowel: Stomach is normal. No dilated loops of small or large bowel. The appendix is visualized and appears normal. There is wall thickening involving the descending colon, distal sigmoid colon. Mild pericolonic soft  tissue stranding is noted. No pneumatosis or signs of bowel perforation. Vascular/Lymphatic: Aortic atherosclerosis. No aneurysm. No abdominopelvic adenopathy. Reproductive: The uterus appears surgically absent. Corresponding to the ultrasound abnormality there is a 7.7 by 7.2 by 6.5 cm cystic structure, image 64/2. Within the limitations of unenhanced technique no mural nodule or septation noted. Other: No free fluid or fluid collections. Musculoskeletal: No acute or significant osseous findings. Multilevel degenerative disc disease noted. IMPRESSION: 1. Corresponding to the ultrasound abnormality is a 7.7 cm cystic structure within the left adnexa. Within the limitations of unenhanced technique no mural nodule or septation noted. Because this lesion is not adequately characterized, prompt [name] is recommended for further evaluation. Note: This recommendation does not apply to premenarchal patients and to those with increased risk (genetic, family history, elevated tumor markers or other high-risk factors) of ovarian cancer. Reference: JACR 2020 Feb; 17(2):248-254 2. There is mild wall thickening involving the descending colon and sigmoid colon. Correlate for signs or symptoms of colitis. 3. Gallstones. 4. Left adrenal gland adenoma. 5. Aortic atherosclerosis. Aortic Atherosclerosis (ICD10-I70.0). Electronically Signed: By: Kerby Moors M.D. On: 10/30/2019 12:54        Scheduled Meds: . amLODipine  10 mg Oral Daily  . asenapine  10 mg Sublingual BID  . Chlorhexidine Gluconate Cloth  6 each Topical Daily  . dicyclomine  10 mg Oral TID AC & HS  . ferrous sulfate  325 mg Oral BID WC  . fluticasone furoate-vilanterol  1 puff Inhalation Daily   Or  . umeclidinium bromide  1 puff Inhalation Daily  . gabapentin  100 mg Oral BID  . heparin  5,000 Units Subcutaneous Q8H  . metroNIDAZOLE  500 mg Oral Q8H  . montelukast  10 mg Oral QHS  . oxybutynin  10 mg Oral QHS  . prazosin  5 mg Oral BID  .  saccharomyces boulardii  250 mg Oral BID  . vancomycin  125 mg Oral QID   Continuous Infusions: . dextrose 5 % and 0.45% NaCl 75 mL/hr at 10/31/19 1400     LOS: 4 days   Time spent:35 mins Greater than 50% of this time was spent in counseling, explanation of diagnosis, planning of further management, and coordination of care.  I have personally reviewed and interpreted on  10/31/2019 daily labs,  I reviewed all nursing notes, pharmacy notes, consultant notes,  vitals, pertinent old records  I have discussed plan of care as described above with RN , patient  on 10/31/2019  Voice Recognition /Dragon dictation system was used to create this note, attempts have been made to correct errors. Please contact the author with questions and/or clarifications.   Florencia Reasons, MD PhD FACP [facility]  Available via Epic secure chat 7am-7pm for nonurgent issues Please page for urgent issues To page the attending provider between 7A-7P or the covering provider during after hours 7P-7A, please log into the web site [email or web address] and access using universal  password for that web site. If you do not have the password, please call the hospital operator.    10/31/2019, 3:28 PM

## 2019-11-01 DIAGNOSIS — G9341 Metabolic encephalopathy: Secondary | ICD-10-CM | POA: Diagnosis not present

## 2019-11-01 DIAGNOSIS — N179 Acute kidney failure, unspecified: Secondary | ICD-10-CM | POA: Diagnosis not present

## 2019-11-01 LAB — GLUCOSE, CAPILLARY
Glucose-Capillary: 106 mg/dL — ABNORMAL HIGH (ref 70–99)
Glucose-Capillary: 138 mg/dL — ABNORMAL HIGH (ref 70–99)
Glucose-Capillary: 99 mg/dL (ref 70–99)

## 2019-11-01 LAB — RENAL FUNCTION PANEL
Albumin: 2.5 g/dL — ABNORMAL LOW (ref 3.5–5.0)
Anion gap: 10 (ref 5–15)
BUN: 14 mg/dL (ref 6–20)
CO2: 24 mmol/L (ref 22–32)
Calcium: 8.7 mg/dL — ABNORMAL LOW (ref 8.9–10.3)
Chloride: 109 mmol/L (ref 98–111)
Creatinine, Ser: 1.27 mg/dL — ABNORMAL HIGH (ref 0.44–1.00)
GFR, Estimated: 46 mL/min — ABNORMAL LOW (ref >60)
Glucose, Bld: 114 mg/dL — ABNORMAL HIGH (ref 70–99)
Phosphorus: 3.6 mg/dL (ref 2.5–4.6)
Potassium: 4 mmol/L (ref 3.5–5.1)
Sodium: 143 mmol/L (ref 135–145)

## 2019-11-01 LAB — CA 125: Cancer Antigen (CA) 125: 18.4 U/mL (ref 0.0–38.1)

## 2019-11-01 MED ORDER — VANCOMYCIN 50 MG/ML ORAL SOLUTION
500.0000 mg | Freq: Four times a day (QID) | ORAL | Status: DC
  Administered 2019-11-01 – 2019-11-02 (×6): 500 mg via ORAL
  Filled 2019-11-01 (×7): qty 10

## 2019-11-01 NOTE — Progress Notes (Signed)
PROGRESS NOTE    Tiffany [name]  IWP:[id number] DOB: 12/16/1960 DOA: 10/27/2019 PCP: [name], DO    Chief Complaint  Patient presents with  . Altered Mental Status    Brief Narrative:  59 year old female with history of hypertension on amlodipine, hyperlipidemia, and anemia, CKDIIIb baseline creatinine 1.5-1.8 diabetes, COPD brought to the ED with lethargy/altered mental status.  On presentation patient unable to participate in the family available unable to contact so history is limited. Apparently she was found increasingly confused and weak, family became concerned and called EMS.  Had recent PCP visit 10/22/2019 for diarrhea "not feeling well", advised supportive care.  There has been no report of diarrhea as a consult for this visit.  In ED,Patient was found to have altered mental status minimally responding, work-up showed acute kidney injury, metabolic acidosis, hyperkalemia.  No acute significant finding. Nephrology was consulted.  Subjective:  Continue to complain of left lower ab pain, continue to have foul smelling watery diarrhea, no fever, no nausea no vomiting [name] fully alert, interactive,  She demonstrate paranoid behavior , tangential thought process with intermittent agitation  Assessment & Plan:   Active Problems:   Schizoaffective disorder, bipolar type ([place])   ARF (acute renal failure) (HCC)   Pressure injury of skin  AKI on CKD IIIb/metabolic acidosis, Creatinine on presentation 4.1, baseline creatinine 1.5-1.8: CK slightly elevated at 547. Urine culture no growth Renal ultrasound no obstructive nephropathy  Appreciate nephrology input, She received IV fluids,  bicarb  Drip  Creatinine improving, now back to baseline avoid NSAID medication ACE or ARB.  cystic structure within the left adnexa: -Incidental finding on renal ultrasound showed 8 cm cystic mass anterior to bladder   -CT with IV contrast ordered when okay with  nephrology which showed "Corresponding to the ultrasound abnormality is a 7.7 cm cystic structure within the left adnexa. On the postcontrast images there are no internal areas of enhancing septation or mural nodule. Based on the CT appearance findings are low risk. Recommend referral to gynecology for further management." -ca125 unremarkable -Follow-up with GYN  Acute encephalopathy likely metabolic/uremic:   CT head No acute finding.  LFTs and ammonia are stable.  UA no evidence of UTI, Chest x-ray hypoinflation with mild hazy left midlung opacity may relate atelectasis or pneumonia but patient without fever or leukocytosis, procal altho up 6.2-low suspicion for infection.UDSpositive for benzo.  COVID-19 screen negative Tylenol level salicylate level undetectable.  TSH 1.2 seems to be resolving.   C. difficile colitis: Continue to have diarrhea with c/o left lower quadrant ab pain  C. difficile antigen positive, toxin negative, toxigenic PCR negative, however she continues have diarrhea and complaining of left lower quadrant pain CT abdomen today showed mild wall thickening involving descending colon and sigmoid colon, started oral vancomycin on 10/14, add on flagyl now that k has normalized, continue to have severe watery diarrhea with abdominal pain, will increase oral vancomycin dose Continue IV hydration due to significant diarrhea, consider repeat KUB if symptom does not improve  Hypokalemia/hypophosphotemia, replaced, normalized , monitor .   Hypomagnesemia, report h/o mag allergy  Hypertension: BP stable.    [name] on prazosin twice daily at home which is continued,  Blood pressure low normal, will hold Norvasc ,  hold metolazone  Prediabetes with hemoglobin A1c 5.6, monitor CBG  Neuropathy on Neurontin , Neurontin held due to AKI , renal function improving,  started back on  low-dose Neurontin soon  Gout- hold colchicine , due  to AKI  COPD/chronic respiratory failure with  hypoxia/hypercapnia/OSA: Followed by [facility], on 2 L nasal cannula baseline, also has been prescribed a trilogy ventilator usee every night.Cont same, cont nebs prn.  H/o hypoxic brain injury h/o schizoaffective; she demonstrates paranoid behavior with intermittent hallucination and aggression  psychiatry consulted, input appreciated,detail please refer to psychiatry consult note on October 14  FTT : Pt eval Skilled nursing facility, however she declined, will arrange home health  DVT prophylaxis: heparin injection 5,000 Units Start: 10/27/19 0930   Code Status: Full Family Communication:  she declined my offer to talk to her mother, mother's contact information is not on file  Disposition:   Status is: Inpatient  Dispo: The patient is from: Home              Anticipated d/c is to:  Home with home health, she declined snf placement              Anticipated d/c date is: TBD if diarrhea slows done and improvement of ab pain                Consultants:   Nephrology  Psychiatry  Procedures:   None  Antimicrobials:   Oral Vanco and oral Flagyl     Objective: Vitals:   10/31/19 0500 10/31/19 1206 10/31/19 2201 11/01/19 0613  BP: (!) 139/97 106/64 134/86 130/71  Pulse: 75 94 65 82  Resp: 18 16 18 18   Temp: 99.1 F (37.3 C) 98.9 F (37.2 C) 99.3 F (37.4 C) (!) 97.5 F (36.4 C)  TempSrc: Oral Oral Oral Oral  SpO2: 97% 94% 95% 98%  Weight:      Height:        Intake/Output Summary (Last 24 hours) at 11/01/2019 1324 Last data filed at 11/01/2019 0700 Gross per 24 hour  Intake 3373.9 ml  Output -  Net 3373.9 ml   Filed Weights   10/30/19 0819  Weight: 71.5 kg    Examination:  General exam; AAOx3, alert and interactive, paranoia Respiratory system: Clear to auscultation. Respiratory effort normal. Cardiovascular system: S1 & S2 heard, RRR. No JVD, no murmur, No pedal edema. Gastrointestinal system: Abdomen is nondistended, soft , left lower  quadrant tenderness, no guarding, no rebound,   Normal bowel sounds heard. Central nervous system: Alert and oriented x3, moving all extremities Extremities: Generalized weakness, no edema Skin: No rashes, lesions or ulcers Psychiatry:  interactive, intermittent agitation, intermittent hallucination, tangential thought process, paranoid behavior    Data Reviewed: I have personally reviewed following labs and imaging studies  CBC: Recent Labs  Lab 10/27/19 0358 10/27/19 0358 10/27/19 0523 10/27/19 1041 10/28/19 0452 10/29/19 0631 10/31/19 0420  WBC 8.5  --   --  7.4 9.2 10.1 10.1  NEUTROABS 5.9  --   --   --   --   --  7.2  HGB 13.3   < > 14.3 12.1 11.6* 10.4* 10.4*  HCT 43.1   < > 42.0 38.5 35.9* 32.2* 32.4*  MCV 91.3  --   --  89.1 86.3 88.2 87.3  PLT 310  --   --  304 270 234 259   < > = values in this interval not displayed.    Basic Metabolic Panel: Recent Labs  Lab 10/28/19 0452 10/29/19 0631 10/30/19 0820 10/31/19 0420 11/01/19 0558  NA 147*  150* 135 133* 138 143  K 3.4*  3.6 3.4* 3.1* 3.5 4.0  CL 110  111 101 101 105 109  CO2 25  25 25 23 23 24   GLUCOSE 123*  123* 99 97 103* 114*  BUN 79*  81* 59* 36* 22* 14  CREATININE 2.26*  2.19* 1.55* 1.35* 1.19* 1.27*  CALCIUM 8.7*  8.8* 8.1* 8.1* 8.3* 8.7*  MG  --  1.6*  --   --   --   PHOS 2.0* 1.6* 2.2*  2.2* 3.7  3.6 3.6    GFR: Estimated Creatinine Clearance: 43.7 mL/min (A) (by C-G formula based on SCr of 1.27 mg/dL (H)).  Liver Function Tests: Recent Labs  Lab 10/27/19 0358 10/27/19 1041 10/28/19 0452 10/29/19 0631 10/30/19 0820 10/31/19 0420 11/01/19 0558  AST 40  --  29 33  --   --   --   ALT 31  --  30 31  --   --   --   ALKPHOS 175*  --  127* 108  --   --   --   BILITOT 1.4*  --  0.6 0.6  --   --   --   PROT 6.8  --  5.7* 4.9*  --   --   --   ALBUMIN 3.1*   < > 2.6*  2.6* 2.3* 2.5* 2.5* 2.5*   < > = values in this interval not displayed.    CBG: Recent Labs  Lab 10/31/19 0554  10/31/19 1146 10/31/19 1725 10/31/19 2350 11/01/19 0610  GLUCAP 101* 106* 105* 100* 106*     Recent Results (from the past 240 hour(s))  Respiratory Panel by RT PCR (Flu A&B, Covid) - Nasopharyngeal Swab     Status: None   Collection Time: 10/27/19  3:58 AM   Specimen: Nasopharyngeal Swab  Result Value Ref Range Status   SARS Coronavirus 2 by RT PCR NEGATIVE NEGATIVE Final    Comment: (NOTE) SARS-CoV-2 target nucleic acids are NOT DETECTED.  The SARS-CoV-2 RNA is generally detectable in upper respiratoy specimens during the acute phase of infection. The lowest concentration of SARS-CoV-2 viral copies this assay can detect is 131 copies/mL. A negative result does not preclude SARS-Cov-2 infection and should not be used as the sole basis for treatment or other patient management decisions. A negative result may occur with  improper specimen collection/handling, submission of specimen other than nasopharyngeal swab, presence of viral mutation(s) within the areas targeted by this assay, and inadequate number of viral copies (<131 copies/mL). A negative result must be combined with clinical observations, patient history, and epidemiological information. The expected result is Negative.  Fact Sheet for Patients:  PinkCheek.be  Fact Sheet for Healthcare Providers:  GravelBags.it  This test is no t yet approved or cleared by the [place] FDA and  has been authorized for detection and/or diagnosis of SARS-CoV-2 by FDA under an Emergency Use Authorization (EUA). This EUA will remain  in effect (meaning this test can be used) for the duration of the COVID-19 declaration under Section 564(b)(1) of the Act, 21 U.S.C. section 360bbb-3(b)(1), unless the authorization is terminated or revoked sooner.     Influenza A by PCR NEGATIVE NEGATIVE Final   Influenza B by PCR NEGATIVE NEGATIVE Final    Comment: (NOTE) The Xpert  Xpress SARS-CoV-2/FLU/RSV assay is intended as an aid in  the diagnosis of influenza from Nasopharyngeal swab specimens and  should not be used as a sole basis for treatment. Nasal washings and  aspirates are unacceptable for Xpert Xpress SARS-CoV-2/FLU/RSV  testing.  Fact Sheet for Patients: PinkCheek.be  Fact Sheet for Healthcare Providers: GravelBags.it  This test is not yet approved or cleared by the [place] and  has been authorized for detection and/or diagnosis of SARS-CoV-2 by  FDA under an Emergency Use Authorization (EUA). This EUA will remain  in effect (meaning this test can be used) for the duration of the  Covid-19 declaration under Section 564(b)(1) of the Act, 21  U.S.C. section 360bbb-3(b)(1), unless the authorization is  terminated or revoked. Performed at [facility], [place] [address] [place], [place] [address]   Urine culture     Status: None   Collection Time: 10/27/19  3:58 AM   Specimen: Urine, Catheterized  Result Value Ref Range Status   Specimen Description   Final    URINE, CATHETERIZED Performed at [facility] [address] [place], [place] [address]    Special Requests   Final    NONE Performed at [facility], [place] [address] [place], [place] [address]    Culture   Final    NO GROWTH Performed at [facility], [place] [address] [place], [place] [address]    Report Status 10/28/2019 FINAL  Final  Gastrointestinal Panel by PCR , Stool     Status: None   Collection Time: 10/28/19 12:36 PM   Specimen: Stool  Result Value Ref Range Status   Campylobacter species NOT DETECTED NOT DETECTED Final   Plesimonas shigelloides NOT DETECTED NOT DETECTED Final   Salmonella species NOT DETECTED NOT DETECTED Final   Yersinia enterocolitica NOT DETECTED NOT DETECTED Final   Vibrio species NOT DETECTED NOT DETECTED Final   Vibrio  cholerae NOT DETECTED NOT DETECTED Final   Enteroaggregative E coli (EAEC) NOT DETECTED NOT DETECTED Final   Enteropathogenic E coli (EPEC) NOT DETECTED NOT DETECTED Final   Enterotoxigenic E coli (ETEC) NOT DETECTED NOT DETECTED Final   Shiga like toxin producing E coli (STEC) NOT DETECTED NOT DETECTED Final   Shigella/Enteroinvasive E coli (EIEC) NOT DETECTED NOT DETECTED Final   Cryptosporidium NOT DETECTED NOT DETECTED Final   Cyclospora cayetanensis NOT DETECTED NOT DETECTED Final   Entamoeba histolytica NOT DETECTED NOT DETECTED Final   Giardia lamblia NOT DETECTED NOT DETECTED Final   Adenovirus F40/41 NOT DETECTED NOT DETECTED Final   Astrovirus NOT DETECTED NOT DETECTED Final   Norovirus GI/GII NOT DETECTED NOT DETECTED Final   Rotavirus A NOT DETECTED NOT DETECTED Final   Sapovirus (I, II, IV, and V) NOT DETECTED NOT DETECTED Final    Comment: Performed at [facility], [address] [place], [place] [address]  C Difficile Quick Screen w PCR reflex     Status: Abnormal   Collection Time: 10/28/19 12:36 PM   Specimen: Stool  Result Value Ref Range Status   C Diff antigen POSITIVE (A) NEGATIVE Final   C Diff toxin NEGATIVE NEGATIVE Final   C Diff interpretation Results are indeterminate. See PCR results.  Final    Comment: Performed at [facility], [place] [address] [place], [place] [address]  C. Diff by PCR, Reflexed     Status: None   Collection Time: 10/28/19 12:36 PM  Result Value Ref Range Status   Toxigenic C. Difficile by PCR NEGATIVE NEGATIVE Final    Comment: Patient is colonized with non toxigenic C. difficile. May not need treatment unless significant symptoms are present. Performed at [facility], [place] [address] [place], [place] [address]          Radiology Studies: No  results found.      Scheduled Meds: . asenapine  10 mg Sublingual BID  . Chlorhexidine Gluconate Cloth  6 each Topical Daily  . dicyclomine  10 mg  Oral TID AC & HS  . ferrous sulfate  325 mg Oral BID WC  . fluticasone furoate-vilanterol  1 puff Inhalation Daily   Or  . umeclidinium bromide  1 puff Inhalation Daily  . gabapentin  100 mg Oral BID  . heparin  5,000 Units Subcutaneous Q8H  . metroNIDAZOLE  500 mg Oral Q8H  . montelukast  10 mg Oral QHS  . oxybutynin  10 mg Oral QHS  . prazosin  5 mg Oral BID  . saccharomyces boulardii  250 mg Oral BID  . vancomycin  500 mg Oral QID   Continuous Infusions: . dextrose 5 % and 0.45% NaCl 75 mL/hr at 11/01/19 0430     LOS: 5 days   Time spent:35 mins Greater than 50% of this time was spent in counseling, explanation of diagnosis, planning of further management, and coordination of care.  I have personally reviewed and interpreted on  11/01/2019 daily labs,  I reviewed all nursing notes, pharmacy notes, consultant notes,  vitals, pertinent old records  I have discussed plan of care as described above with RN , patient  on 11/01/2019  Voice Recognition /Dragon dictation system was used to create this note, attempts have been made to correct errors. Please contact the author with questions and/or clarifications.   Florencia Reasons, MD PhD FACP [facility]  Available via Epic secure chat 7am-7pm for nonurgent issues Please page for urgent issues To page the attending provider between 7A-7P or the covering provider during after hours 7P-7A, please log into the web site [email or web address] and access using universal [place] password for that web site. If you do not have the password, please call the hospital operator.    11/01/2019, 1:24 PM

## 2019-11-02 ENCOUNTER — Inpatient Hospital Stay (HOSPITAL_COMMUNITY): Payer: Medicare Other

## 2019-11-02 DIAGNOSIS — E876 Hypokalemia: Secondary | ICD-10-CM

## 2019-11-02 DIAGNOSIS — A0472 Enterocolitis due to Clostridium difficile, not specified as recurrent: Secondary | ICD-10-CM | POA: Diagnosis not present

## 2019-11-02 DIAGNOSIS — N179 Acute kidney failure, unspecified: Secondary | ICD-10-CM | POA: Diagnosis not present

## 2019-11-02 DIAGNOSIS — F250 Schizoaffective disorder, bipolar type: Secondary | ICD-10-CM

## 2019-11-02 LAB — RENAL FUNCTION PANEL
Albumin: 2.5 g/dL — ABNORMAL LOW (ref 3.5–5.0)
Anion gap: 7 (ref 5–15)
BUN: 11 mg/dL (ref 6–20)
CO2: 23 mmol/L (ref 22–32)
Calcium: 8.3 mg/dL — ABNORMAL LOW (ref 8.9–10.3)
Chloride: 107 mmol/L (ref 98–111)
Creatinine, Ser: 1.22 mg/dL — ABNORMAL HIGH (ref 0.44–1.00)
GFR, Estimated: 48 mL/min — ABNORMAL LOW (ref >60)
Glucose, Bld: 102 mg/dL — ABNORMAL HIGH (ref 70–99)
Phosphorus: 3.6 mg/dL (ref 2.5–4.6)
Potassium: 3.8 mmol/L (ref 3.5–5.1)
Sodium: 137 mmol/L (ref 135–145)

## 2019-11-02 LAB — GLUCOSE, CAPILLARY
Glucose-Capillary: 99 mg/dL (ref 70–99)
Glucose-Capillary: 91 mg/dL (ref 70–99)

## 2019-11-02 MED ORDER — POTASSIUM CHLORIDE CRYS ER 20 MEQ PO TBCR: 40.0000 meq | EXTENDED_RELEASE_TABLET | Freq: Every day | ORAL | 1 refills | Status: DC

## 2019-11-02 MED ORDER — ALBUTEROL SULFATE HFA 108 (90 BASE) MCG/ACT IN AERS
2.0000 | INHALATION_SPRAY | RESPIRATORY_TRACT | Status: DC | PRN
  Administered 2019-11-02: 2 via RESPIRATORY_TRACT
  Filled 2019-11-02: qty 6.7

## 2019-11-02 MED ORDER — SACCHAROMYCES BOULARDII 250 MG PO CAPS: 250.0000 mg | ORAL_CAPSULE | Freq: Two times a day (BID) | ORAL | 0 refills | Status: DC

## 2019-11-02 MED ORDER — PRAZOSIN HCL 5 MG PO CAPS: 5.0000 mg | ORAL_CAPSULE | Freq: Two times a day (BID) | ORAL | 2 refills | Status: DC

## 2019-11-02 MED ORDER — VANCOMYCIN HCL 125 MG PO CAPS: 250.0000 mg | ORAL_CAPSULE | Freq: Four times a day (QID) | ORAL | 0 refills | Status: AC

## 2019-11-02 MED ORDER — FERROUS SULFATE 325 (65 FE) MG PO TABS: 325.0000 mg | ORAL_TABLET | Freq: Every day | ORAL | 0 refills | Status: DC

## 2019-11-02 NOTE — Progress Notes (Signed)
AVS given to patient and explained at the bedside. Medications and follow up appointments have been explained with pt verbalizing understanding.  

## 2019-11-02 NOTE — Progress Notes (Signed)
PROGRESS NOTE    Tiffany [name]  PRF:[id number] DOB: 10-19-1960 DOA: 10/27/2019 PCP: [name], DO    Chief Complaint  Patient presents with  . Altered Mental Status    Brief Narrative:  59 year old female with history of hypertension on amlodipine, hyperlipidemia, and anemia, CKDIIIb baseline creatinine 1.5-1.8 diabetes, COPD brought to the ED with lethargy/altered mental status.  On presentation patient unable to participate in the family available unable to contact so history is limited. Apparently she was found increasingly confused and weak, family became concerned and called EMS.  Had recent PCP visit 10/22/2019 for diarrhea "not feeling well", advised supportive care.  There has been no report of diarrhea as a consult for this visit.  In ED,Patient was found to have altered mental status minimally responding, work-up showed acute kidney injury, metabolic acidosis, hyperkalemia.  No acute significant finding. Nephrology was consulted.  Subjective:  6 bm last 24hrs, continue to c/o ab pain, no n/v  poor oral intake, continue on ivf [name] fully alert, interactive,   Assessment & Plan:   Active Problems:   Schizoaffective disorder, bipolar type ([place])   ARF (acute renal failure) (HCC)   Pressure injury of skin  C. difficile colitis: Continue to have diarrhea with c/o left lower quadrant ab pain  C. difficile antigen positive, toxin negative, toxigenic PCR negative, however she continues have diarrhea and complaining of left lower quadrant pain CT abdomen on 10/14 showed mild wall thickening involving descending colon and sigmoid colon, started oral vancomycin on 10/14, add on flagyl now that k has normalized, continue to have severe watery diarrhea with abdominal pain, oral vancomycin dose increased on 10/16 Continue IV hydration due to significant diarrhea and poor oral intake,  Repeat kub due to persistent ab pain If diarrhea slows down and ab pain  improves, can discharge home on oral vanc.  Hypokalemia/hypophosphotemia, replaced, normalized , monitor .   Hypomagnesemia, report h/o mag allergy  AKI on CKD IIIb/metabolic acidosis, Creatinine on presentation 4.1, baseline creatinine 1.5-1.8: CK slightly elevated at 547. Urine culture no growth Renal ultrasound no obstructive nephropathy  Appreciate nephrology input, She received IV fluids,  bicarb  Drip  Creatinine improving, now back to baseline avoid NSAID medication ACE or ARB.   Acute encephalopathy likely metabolic/uremic:   CT head No acute finding.  LFTs and ammonia are stable.  UA no evidence of UTI, Chest x-ray hypoinflation with mild hazy left midlung opacity may relate atelectasis or pneumonia but patient without fever or leukocytosis, procal altho up 6.2-low suspicion for infection.UDSpositive for benzo.  COVID-19 screen negative Tylenol level salicylate level undetectable.  TSH 1.2 seems to be resolving.    cystic structure within the left adnexa: -Incidental finding on renal ultrasound showed 8 cm cystic mass anterior to bladder   -CT with IV contrast ordered when okay with nephrology which showed "Corresponding to the ultrasound abnormality is a 7.7 cm cystic structure within the left adnexa. On the postcontrast images there are no internal areas of enhancing septation or mural nodule. Based on the CT appearance findings are low risk. Recommend referral to gynecology for further management." -ca125 unremarkable -Follow-up with GYN   Hypertension: BP stable.    [name] on prazosin twice daily at home which is continued,  Blood pressure low normal, will hold Norvasc ,  hold metolazone  Prediabetes with hemoglobin A1c 5.6, monitor CBG  Neuropathy on Neurontin , Neurontin held due to AKI , renal function improving,  started back on  low-dose Neurontin soon  Gout- hold colchicine , due to AKI  COPD/chronic respiratory failure with hypoxia/hypercapnia/OSA: Followed  by [facility], on 2 L nasal cannula baseline, also has been prescribed a trilogy ventilator usee every night.Cont same, cont nebs prn.  H/o hypoxic brain injury h/o schizoaffective; she demonstrates paranoid behavior with intermittent hallucination and aggression  psychiatry consulted, input appreciated,detail please refer to psychiatry consult note on October 14  FTT : Pt eval Skilled nursing facility, however she declined, will arrange home health  DVT prophylaxis: heparin injection 5,000 Units Start: 10/27/19 0930   Code Status: Full Family Communication:  she declined my offer to talk to her mother, mother's contact information is not on file  Disposition:   Status is: Inpatient  Dispo: The patient is from: Home              Anticipated d/c is to:  Home with home health, she declined snf placement              Anticipated d/c date is: 24-48 hrs if diarrhea slows done and improvement of ab pain              follow up on kub  Consultants:   Nephrology  Psychiatry  Procedures:   None  Antimicrobials:   Oral Vanco and oral Flagyl     Objective: Vitals:   11/01/19 1530 11/01/19 2113 11/02/19 0424 11/02/19 0853  BP: 128/89 135/79 117/86   Pulse: 70 (!) 58 81   Resp: 18 18 18    Temp: 99.8 F (37.7 C) 98.6 F (37 C) 98.9 F (37.2 C)   TempSrc: Oral Oral Oral   SpO2: 95% 98% 97% 98%  Weight:      Height:        Intake/Output Summary (Last 24 hours) at 11/02/2019 0954 Last data filed at 11/02/2019 0700 Gross per 24 hour  Intake 2017.8 ml  Output --  Net 2017.8 ml   Filed Weights   10/30/19 0819  Weight: 71.5 kg    Examination:  General exam; AAOx3, alert and interactive, paranoia Respiratory system: Clear to auscultation. Respiratory effort normal. Cardiovascular system: S1 & S2 heard, RRR. No JVD, no murmur, No pedal edema. Gastrointestinal system: Abdomen is nondistended, soft , left lower quadrant tenderness, no guarding, no rebound,   Normal  bowel sounds heard. Central nervous system: Alert and oriented x3, moving all extremities Extremities: Generalized weakness, no edema Skin: No rashes, lesions or ulcers Psychiatry:  interactive, intermittent agitation, intermittent hallucination, tangential thought process, paranoid behavior    Data Reviewed: I have personally reviewed following labs and imaging studies  CBC: Recent Labs  Lab 10/27/19 0358 10/27/19 0358 10/27/19 0523 10/27/19 1041 10/28/19 0452 10/29/19 0631 10/31/19 0420  WBC 8.5  --   --  7.4 9.2 10.1 10.1  NEUTROABS 5.9  --   --   --   --   --  7.2  HGB 13.3   < > 14.3 12.1 11.6* 10.4* 10.4*  HCT 43.1   < > 42.0 38.5 35.9* 32.2* 32.4*  MCV 91.3  --   --  89.1 86.3 88.2 87.3  PLT 310  --   --  304 270 234 259   < > = values in this interval not displayed.    Basic Metabolic Panel: Recent Labs  Lab 10/29/19 0631 10/30/19 0820 10/31/19 0420 11/01/19 0558 11/02/19 0508  NA 135 133* 138 143 137  K 3.4* 3.1* 3.5 4.0 3.8  CL 101 101 105  109 107  CO2 25 23 23 24 23   GLUCOSE 99 97 103* 114* 102*  BUN 59* 36* 22* 14 11  CREATININE 1.55* 1.35* 1.19* 1.27* 1.22*  CALCIUM 8.1* 8.1* 8.3* 8.7* 8.3*  MG 1.6*  --   --   --   --   PHOS 1.6* 2.2*  2.2* 3.7  3.6 3.6 3.6    GFR: Estimated Creatinine Clearance: 45.4 mL/min (A) (by C-G formula based on SCr of 1.22 mg/dL (H)).  Liver Function Tests: Recent Labs  Lab 10/27/19 0358 10/27/19 1041 10/28/19 0452 10/28/19 0452 10/29/19 0631 10/30/19 0820 10/31/19 0420 11/01/19 0558 11/02/19 0508  AST 40  --  29  --  33  --   --   --   --   ALT 31  --  30  --  31  --   --   --   --   ALKPHOS 175*  --  127*  --  108  --   --   --   --   BILITOT 1.4*  --  0.6  --  0.6  --   --   --   --   PROT 6.8  --  5.7*  --  4.9*  --   --   --   --   ALBUMIN 3.1*   < > 2.6*  2.6*   < > 2.3* 2.5* 2.5* 2.5* 2.5*   < > = values in this interval not displayed.    CBG: Recent Labs  Lab 10/31/19 2350 11/01/19 0610  11/01/19 1659 11/01/19 2337 11/02/19 0622  GLUCAP 100* 106* 138* 99 99     Recent Results (from the past 240 hour(s))  Respiratory Panel by RT PCR (Flu A&B, Covid) - Nasopharyngeal Swab     Status: None   Collection Time: 10/27/19  3:58 AM   Specimen: Nasopharyngeal Swab  Result Value Ref Range Status   SARS Coronavirus 2 by RT PCR NEGATIVE NEGATIVE Final    Comment: (NOTE) SARS-CoV-2 target nucleic acids are NOT DETECTED.  The SARS-CoV-2 RNA is generally detectable in upper respiratoy specimens during the acute phase of infection. The lowest concentration of SARS-CoV-2 viral copies this assay can detect is 131 copies/mL. A negative result does not preclude SARS-Cov-2 infection and should not be used as the sole basis for treatment or other patient management decisions. A negative result may occur with  improper specimen collection/handling, submission of specimen other than nasopharyngeal swab, presence of viral mutation(s) within the areas targeted by this assay, and inadequate number of viral copies (<131 copies/mL). A negative result must be combined with clinical observations, patient history, and epidemiological information. The expected result is Negative.  Fact Sheet for Patients:  PinkCheek.be  Fact Sheet for Healthcare Providers:  GravelBags.it  This test is no t yet approved or cleared by the [place] FDA and  has been authorized for detection and/or diagnosis of SARS-CoV-2 by FDA under an Emergency Use Authorization (EUA). This EUA will remain  in effect (meaning this test can be used) for the duration of the COVID-19 declaration under Section 564(b)(1) of the Act, 21 U.S.C. section 360bbb-3(b)(1), unless the authorization is terminated or revoked sooner.     Influenza A by PCR NEGATIVE NEGATIVE Final   Influenza B by PCR NEGATIVE NEGATIVE Final    Comment: (NOTE) The Xpert Xpress  SARS-CoV-2/FLU/RSV assay is intended as an aid in  the diagnosis of influenza from Nasopharyngeal swab specimens and  should not be used  as a sole basis for treatment. Nasal washings and  aspirates are unacceptable for Xpert Xpress SARS-CoV-2/FLU/RSV  testing.  Fact Sheet for Patients: PinkCheek.be  Fact Sheet for Healthcare Providers: GravelBags.it  This test is not yet approved or cleared by the [place] FDA and  has been authorized for detection and/or diagnosis of SARS-CoV-2 by  FDA under an Emergency Use Authorization (EUA). This EUA will remain  in effect (meaning this test can be used) for the duration of the  Covid-19 declaration under Section 564(b)(1) of the Act, 21  U.S.C. section 360bbb-3(b)(1), unless the authorization is  terminated or revoked. Performed at [facility], [place] [address], [place], [place] [address]   Urine culture     Status: None   Collection Time: 10/27/19  3:58 AM   Specimen: Urine, Catheterized  Result Value Ref Range Status   Specimen Description   Final    URINE, CATHETERIZED Performed at [facility] [address] [place], [place] [address]    Special Requests   Final    NONE Performed at [facility], [place] [address], [place], [place] [address]    Culture   Final    NO GROWTH Performed at [facility], [place] [address] [place], [place] [address]    Report Status 10/28/2019 FINAL  Final  Gastrointestinal Panel by PCR , Stool     Status: None   Collection Time: 10/28/19 12:36 PM   Specimen: Stool  Result Value Ref Range Status   Campylobacter species NOT DETECTED NOT DETECTED Final   Plesimonas shigelloides NOT DETECTED NOT DETECTED Final   Salmonella species NOT DETECTED NOT DETECTED Final   Yersinia enterocolitica NOT DETECTED NOT DETECTED Final   Vibrio species NOT DETECTED NOT DETECTED Final   Vibrio  cholerae NOT DETECTED NOT DETECTED Final   Enteroaggregative E coli (EAEC) NOT DETECTED NOT DETECTED Final   Enteropathogenic E coli (EPEC) NOT DETECTED NOT DETECTED Final   Enterotoxigenic E coli (ETEC) NOT DETECTED NOT DETECTED Final   Shiga like toxin producing E coli (STEC) NOT DETECTED NOT DETECTED Final   Shigella/Enteroinvasive E coli (EIEC) NOT DETECTED NOT DETECTED Final   Cryptosporidium NOT DETECTED NOT DETECTED Final   Cyclospora cayetanensis NOT DETECTED NOT DETECTED Final   Entamoeba histolytica NOT DETECTED NOT DETECTED Final   Giardia lamblia NOT DETECTED NOT DETECTED Final   Adenovirus F40/41 NOT DETECTED NOT DETECTED Final   Astrovirus NOT DETECTED NOT DETECTED Final   Norovirus GI/GII NOT DETECTED NOT DETECTED Final   Rotavirus A NOT DETECTED NOT DETECTED Final   Sapovirus (I, II, IV, and V) NOT DETECTED NOT DETECTED Final    Comment: Performed at [facility], [address] [place], [place] [address]  C Difficile Quick Screen w PCR reflex     Status: Abnormal   Collection Time: 10/28/19 12:36 PM   Specimen: Stool  Result Value Ref Range Status   C Diff antigen POSITIVE (A) NEGATIVE Final   C Diff toxin NEGATIVE NEGATIVE Final   C Diff interpretation Results are indeterminate. See PCR results.  Final    Comment: Performed at [facility], [place] [address] [place],  [address]  C. Diff by PCR, Reflexed     Status: None   Collection Time: 10/28/19 12:36 PM  Result Value Ref Range Status   Toxigenic C. Difficile by PCR NEGATIVE NEGATIVE Final    Comment: Patient is colonized with non toxigenic C. difficile. May  not need treatment unless significant symptoms are present. Performed at [facility], [place] [address], [place], [place] [address]          Radiology Studies: No results found.      Scheduled Meds: . asenapine  10 mg Sublingual BID  . Chlorhexidine Gluconate Cloth  6 each Topical Daily  . ferrous sulfate   325 mg Oral BID WC  . fluticasone furoate-vilanterol  1 puff Inhalation Daily   Or  . umeclidinium bromide  1 puff Inhalation Daily  . gabapentin  100 mg Oral BID  . heparin  5,000 Units Subcutaneous Q8H  . metroNIDAZOLE  500 mg Oral Q8H  . montelukast  10 mg Oral QHS  . oxybutynin  10 mg Oral QHS  . prazosin  5 mg Oral BID  . saccharomyces boulardii  250 mg Oral BID  . vancomycin  500 mg Oral QID   Continuous Infusions: . dextrose 5 % and 0.45% NaCl 75 mL/hr at 11/02/19 0632     LOS: 6 days   Time spent:25 mins Greater than 50% of this time was spent in counseling, explanation of diagnosis, planning of further management, and coordination of care.  I have personally reviewed and interpreted on  11/02/2019 daily labs,  I reviewed all nursing notes, pharmacy notes, consultant notes,  vitals, pertinent old records  I have discussed plan of care as described above with RN , patient  on 11/02/2019  Voice Recognition /Dragon dictation system was used to create this note, attempts have been made to correct errors. Please contact the author with questions and/or clarifications.   Florencia Reasons, MD PhD FACP [facility]  Available via Epic secure chat 7am-7pm for nonurgent issues Please page for urgent issues To page the attending provider between 7A-7P or the covering provider during after hours 7P-7A, please log into the web site [email or web address] and access using universal [place] password for that web site. If you do not have the password, please call the hospital operator.    11/02/2019, 9:54 AM

## 2019-11-02 NOTE — Discharge Summary (Addendum)
Discharge Summary  [name] KWI:[id number] DOB: 1960/04/08  PCP: [name], DO  Admit date: 10/27/2019 Discharge date: 11/02/2019  Time spent: 81mins, more than 50% time spent on coordination of care.   Recommendations for Outpatient Follow-up:  1. F/u with PCP within a week  for hospital discharge follow up, repeat cbc/bmp at follow up 2. Follow-up with psychiatry 3. Follow-up with GYN 4. Home health and home equipment arranged, she declined skilled nursing facility placement  Discharge Diagnoses:  Active Hospital Problems   Diagnosis Date Noted  . Pressure injury of skin 10/28/2019  . ARF (acute renal failure) ([place]) 10/27/2019  . Schizoaffective disorder, bipolar type ([place]) 04/05/2018    Resolved Hospital Problems  No resolved problems to display.    Discharge Condition: stable  Diet recommendation: heart healthy  Filed Weights   10/30/19 0819  Weight: 71.5 kg    History of present illness: (Per admitting provider Dr. Marylyn Ishihara)  Chief Complaint: AMS  HPI: Tiffany [name] is a 59 y.o. female with medical history significant of CKD3b, DM, HTN, COPD. Presenting with AMS/lethargy. Hx is from chart review as patient is unable to participate and there is no family contact information at time of interview.   Pt was found to be increasingly confused and weak by family. They became concerned and called EMS for transport to [facility]. She had a recent visit to her PCP (10/22/19) with concerns about diarrhea and "not feeling well." At the time, supportive care was recommended. There has been no report of diarrhea as a concern for this visit.   ED Course: Lab work revealed AKI and acidemia. CTH was negative for acute process. TRH was called for admission.   Hospital Course:  Active Problems:   Schizoaffective disorder, bipolar type ([place])   ARF (acute renal failure) ([facility])   Pressure injury of skin   C. difficile colitis: - C. difficile antigen positive,  toxin negative, toxigenic PCR negative, however she has foul smelling watery diarrhea and  left lower quadrant pain -CT abdomen on 10/14 showed mild wall thickening involving descending colon and sigmoid colon, -started oral vancomycin on 10/14, add on flagyl now that k has normalized, continue to have severe watery diarrhea with abdominal pain, oral vancomycin dose increased on 10/16 -Repeat kub due to persistent ab pain no acute findings -Diarrhea has much improved, stool becomes soft  October 17 pm, she adamantly want to go home and October 17 -[name] discharged home with oral vancomycin, close follow-up with PCP  Hypokalemia/hypophosphotemia, replaced, normalized , follow-up with PCP  Hypomagnesemia, report h/o mag allergy, follow-up with PCP  AKI on CKDIIIb/metabolic acidosis, Creatinine on presentation 4.1,baseline creatinine 1.5-1.8: CK slightly elevated at 547. Urine culture no growth Renal ultrasound no obstructive nephropathy Appreciate nephrology input, She received IV fluids,  bicarb  Drip  Creatinine  now back to baseline avoid NSAID medication ACE or ARB.   Acute encephalopathy likely metabolic/uremic: CT headNo acute finding. LFTs and ammonia are stable.UA no evidence of UTI, Chest x-ray hypoinflation with mild hazy left midlung opacity may relate atelectasis or pneumonia but patient without fever or leukocytosis,  UDSpositive for benzo.Tylenol level salicylate level undetectable.  COVID-19 screen negative TSH unremarkable All sedative medication held on admission and entire hospitalization, did not resume Valium  at the discharge Now fully awake, report back to baseline   cystic structure within the left adnexa: -Incidental finding on renal ultrasound showed 8 cm cystic mass anterior to bladder   -CT with IV contrast  ordered when okay with nephrology which showed "Corresponding to the ultrasound abnormality is a 7.7 cm cystic structure within the  left adnexa. On the postcontrast images there are no internal areas of enhancing septation or mural nodule. Based on the CT appearance findings are low risk. Recommend referral to gynecology for further management." -ca125 unremarkable -Follow-up with GYN   Hypertension:BP stable.   [name] on prazosin twice daily at home which is continued,  Hold Norvasc and metolazone, follow-up with PCP  Prediabeteswith hemoglobin A1c 5.6, CBG stable  Neuropathyon Neurontin , Neurontin held due to AKI , renal function improving,   resume Neurontin   Gout-stable  hold colchicine due to AKI, resumed at discharge  COPD/chronic respiratory failure with hypoxia/hypercapnia/OSA:[facility]ed [facility], on 2 L nasal cannula baseline, also has been prescribed a trilogy ventilator usee every night.Cont same, cont nebs prn.  H/o hypoxic brain injury h/o schizoaffective; she demonstrates paranoid behavior with intermittent hallucination and aggression  psychiatry consulted, input appreciated,detail please refer to psychiatry consult note on October 14 Follow-up with psychiatry  Stage II pressure ulcer presents on admission Pressure Injury 10/27/19 Buttocks Right Stage 2 -  Partial thickness loss of dermis presenting as a shallow open injury with a red, pink wound bed without slough. (Active)  10/27/19 2101  Location: Buttocks  Location Orientation: Right  Staging: Stage 2 -  Partial thickness loss of dermis presenting as a shallow open injury with a red, pink wound bed without slough.  Wound Description (Comments):   Present on Admission: Yes    FTT : Pt eval Skilled nursing facility, however she declined, home health arranged  DVT prophylaxis while in the hospital: heparin injection 5,000 Units Start: 10/27/19 0930   Code Status: Full Family Communication:  she declined my offer to talk to her mother, mother's contact information is not on file  Disposition:   Status is:  Inpatient  Dispo: The patient is from: Home  Anticipated d/c is to:  Home with home health, she declined snf placement    Consultants:   Nephrology  Psychiatry  Procedures:   None  Antimicrobials:   Oral Vanco and oral Flagyl   Discharge Exam: BP 112/71 (BP Location: Left Arm)   Pulse 90   Temp 98.8 F (37.1 C) (Oral)   Resp 20   Ht 5\' 2"  (1.575 m)   Wt 71.5 kg   SpO2 97%   BMI 28.83 kg/m   General: NAD Cardiovascular: RRR Respiratory: CTAB  Discharge Instructions You were cared for by a hospitalist during your hospital stay. If you have any questions about your discharge medications or the care you received while you were in the hospital after you are discharged, you can call the unit and asked to speak with the hospitalist on call if the hospitalist that took care of you is not available. Once you are discharged, your primary care physician will handle any further medical issues. Please note that NO REFILLS for any discharge medications will be authorized once you are discharged, as it is imperative that you return to your primary care physician (or establish a relationship with a primary care physician if you do not have one) for your aftercare needs so that they can reassess your need for medications and monitor your lab values.  Discharge Instructions    Diet - low sodium heart healthy   Complete by: As directed    Discharge wound care:   Complete by: As directed    Keep wound dry and clean,  follow up with pcp   Increase activity slowly   Complete by: As directed      Allergies as of 11/02/2019      Reactions   Hydrocodone Shortness Of Breath   Hydrocodone-acetaminophen Shortness Of Breath   Hydroxyzine Anaphylaxis, Shortness Of Breath   Latuda [lurasidone Hcl] Anaphylaxis   Lurasidone Anaphylaxis   Magnesium-containing Compounds Anaphylaxis   Prednisone Anaphylaxis, Swelling, Other (See Comments)   Tongue swelling    Tramadol Anaphylaxis, Swelling   Codeine Nausea And Vomiting   Other Rash   Sulfa Antibiotics Itching   Tape Rash      Medication List    STOP taking these medications   amLODipine 10 MG tablet Commonly known as: NORVASC   diazepam 10 MG tablet Commonly known as: VALIUM   escitalopram 20 MG tablet Commonly known as: LEXAPRO   metolazone 2.5 MG tablet Commonly known as: ZAROXOLYN   omeprazole 40 MG capsule Commonly known as: PRILOSEC   oxybutynin 10 MG 24 hr tablet Commonly known as: DITROPAN-XL     TAKE these medications   albuterol (2.5 MG/3ML) 0.083% nebulizer solution Commonly known as: PROVENTIL Take 3 mLs (2.5 mg total) by nebulization every 6 (six) hours as needed for wheezing or shortness of breath.   albuterol 108 (90 Base) MCG/ACT inhaler Commonly known as: VENTOLIN HFA Inhale 2 puffs into the lungs every 6 (six) hours as needed for wheezing or shortness of breath.   Asenapine Maleate 10 MG Subl Place 1 tablet (10 mg total) under the tongue in the morning, at noon, and at bedtime.   atorvastatin 80 MG tablet Commonly known as: LIPITOR Take 1 tablet (80 mg total) by mouth daily.   colchicine 0.6 MG tablet Take 1 tablet (0.6 mg total) by mouth 2 (two) times daily. What changed: how much to take   diclofenac Sodium 1 % Gel Commonly known as: VOLTAREN Apply 2 g topically 4 (four) times daily.   febuxostat 40 MG tablet Commonly known as: ULORIC Take 1 tablet (40 mg total) by mouth daily.   ferrous sulfate 325 (65 FE) MG tablet Take 1 tablet (325 mg total) by mouth daily with breakfast. What changed: when to take this   gabapentin 600 MG tablet Commonly known as: NEURONTIN Take 1 tablet (600 mg total) by mouth 2 (two) times daily.   lisdexamfetamine 70 MG capsule Commonly known as: Vyvanse Take 1 capsule (70 mg total) by mouth daily before breakfast.   montelukast 10 MG tablet Commonly known as: SINGULAIR Take 10 mg by mouth at bedtime.    multivitamin with minerals Tabs tablet Take 1 tablet by mouth daily.   potassium chloride SA 20 MEQ tablet Commonly known as: KLOR-CON Take 2 tablets (40 mEq total) by mouth daily. What changed: when to take this   prazosin 5 MG capsule Commonly known as: MINIPRESS Take 1 capsule (5 mg total) by mouth 2 (two) times daily.   saccharomyces boulardii 250 MG capsule Commonly known as: FLORASTOR Take 1 capsule (250 mg total) by mouth 2 (two) times daily for 10 days.   traZODone 100 MG tablet Commonly known as: DESYREL Take 1 tablet (100 mg total) by mouth at bedtime as needed for sleep.   Trelegy Ellipta 100-62.5-25 MCG/INH Aepb Generic drug: Fluticasone-Umeclidin-Vilant Inhale 1 puff into the lungs daily.   vancomycin 125 MG capsule Commonly known as: Vancocin HCl Take 2 capsules (250 mg total) by mouth 4 (four) times daily for 7 days.  Durable Medical Equipment  (From admission, onward)         Start     Ordered   10/31/19 1553  For home use only DME 4 wheeled rolling walker with seat  Once       Question:  Patient needs a walker to treat with the following condition  Answer:  Fear for personal safety   10/31/19 1553   10/31/19 1539  For home use only DME Walker rolling  Once       Comments: Rolling walker with 5" wheels  Question Answer Comment  Walker: With [place]   Patient needs a walker to treat with the following condition FTT (failure to thrive) in adult      10/31/19 1538           Discharge Care Instructions  (From admission, onward)         Start     Ordered   11/02/19 0000  Discharge wound care:       Comments: Keep wound dry and clean, follow up with pcp   11/02/19 1707         Allergies  Allergen Reactions  . Hydrocodone Shortness Of Breath  . Hydrocodone-Acetaminophen Shortness Of Breath  . Hydroxyzine Anaphylaxis and Shortness Of Breath  . Latuda [Lurasidone Hcl] Anaphylaxis  . Lurasidone Anaphylaxis  .  Magnesium-Containing Compounds Anaphylaxis  . Prednisone Anaphylaxis, Swelling and Other (See Comments)    Tongue swelling  . Tramadol Anaphylaxis and Swelling  . Codeine Nausea And Vomiting  . Other Rash  . Sulfa Antibiotics Itching  . Tape Rash    Follow-up Information    [name], DO Follow up in 1 week(s).   Specialty: Family Medicine Why: Hospital discharge follow-up, repeat CBC BMP and follow-up Contact information: [address] [address] [phone]        Sueanne Margarita, MD .   Specialty: Cardiology Contact information: [phone] [address] [place] [address] [place] [address] [phone]        gyn Follow up in 1 month(s).   Why: 7.7 cm cystic structure within the left adnexa.       [facility] Follow up.   Specialty: Home Health Services Why: please call the above number if you have any question for home health physical therapy and nurse aid. Contact information: [place] [address] [phone]        Pucilowski, Marchia Bond, MD Follow up.   Specialty: Psychiatry Contact information: [address] [place] [address] [phone]                The results of significant diagnostics from this hospitalization (including imaging, microbiology, ancillary and laboratory) are listed below for reference.    Significant Diagnostic Studies: CT ABDOMEN PELVIS W WO CONTRAST  Addendum Date: 10/30/2019   ADDENDUM REPORT: 10/30/2019 15:51 ADDENDUM: The following is a correction to the original report under the reproductive and impression portion: Reproductive: The uterus appears surgically absent. Corresponding to the ultrasound abnormality there is a 7.7 by 7.2 by 6.5 cm cystic structure, image 64/2. No internal enhancing septation or mural nodule identified. IMPRESSION: 1. Corresponding to the ultrasound abnormality is a 7.7 cm cystic structure within the left adnexa. On the  postcontrast images there are no internal areas of enhancing septation or mural nodule. Based on the CT appearance findings are low risk. Recommend referral to gynecology for further management. 2. There is mild  wall thickening involving the descending colon and sigmoid colon. Correlate for signs or symptoms of colitis. 3. Gallstones. 4. Left adrenal gland adenoma. 5. Aortic atherosclerosis. Electronically Signed   By: Kerby Moors M.D.   On: 10/30/2019 15:51   Result Date: 10/30/2019 CLINICAL DATA:  Evaluate pelvic cyst. EXAM: CT ABDOMEN AND PELVIS WITHOUT AND WITH CONTRAST TECHNIQUE: Multidetector CT imaging of the abdomen and pelvis was performed following the standard protocol before and following the bolus administration of intravenous contrast. CONTRAST:  143mL OMNIPAQUE IOHEXOL 300 MG/ML  SOLN COMPARISON:  Ultrasound 10/27/2019. FINDINGS: Lower chest: No acute abnormality. Hepatobiliary: Normal appearance of the liver. Small stones are noted layering within the gallbladder measuring up to 3 mm. No signs of gallbladder inflammation or bile duct dilatation. Pancreas: Unremarkable. No pancreatic ductal dilatation or surrounding inflammatory changes. Spleen: Normal in size without focal abnormality. Adrenals/Urinary Tract: There is a 9 mm low-density nodule in the left adrenal gland measuring -6 Hounsfield units. This is compatible with a benign adenoma. The kidneys are unremarkable. No kidney stone or hydronephrosis identified. Urinary bladder is partially collapsed around a Foley catheter balloon. Stomach/Bowel: Stomach is normal. No dilated loops of small or large bowel. The appendix is visualized and appears normal. There is wall thickening involving the descending colon, distal sigmoid colon. Mild pericolonic soft tissue stranding is noted. No pneumatosis or signs of bowel perforation. Vascular/Lymphatic: Aortic atherosclerosis. No aneurysm. No abdominopelvic adenopathy. Reproductive: The uterus appears  surgically absent. Corresponding to the ultrasound abnormality there is a 7.7 by 7.2 by 6.5 cm cystic structure, image 64/2. Within the limitations of unenhanced technique no mural nodule or septation noted. Other: No free fluid or fluid collections. Musculoskeletal: No acute or significant osseous findings. Multilevel degenerative disc disease noted. IMPRESSION: 1. Corresponding to the ultrasound abnormality is a 7.7 cm cystic structure within the left adnexa. Within the limitations of unenhanced technique no mural nodule or septation noted. Because this lesion is not adequately characterized, prompt [name] is recommended for further evaluation. Note: This recommendation does not apply to premenarchal patients and to those with increased risk (genetic, family history, elevated tumor markers or other high-risk factors) of ovarian cancer. Reference: JACR 2020 Feb; 17(2):248-254 2. There is mild wall thickening involving the descending colon and sigmoid colon. Correlate for signs or symptoms of colitis. 3. Gallstones. 4. Left adrenal gland adenoma. 5. Aortic atherosclerosis. Aortic Atherosclerosis (ICD10-I70.0). Electronically Signed: By: Kerby Moors M.D. On: 10/30/2019 12:54   DG Abd 1 View  Result Date: 11/02/2019 CLINICAL DATA:  Diarrhea. Not feeling well. Positive for C diff 3 days ago. EXAM: ABDOMEN - 1 VIEW COMPARISON:  April 10, 2016 FINDINGS: There is a paucity of bowel gas limiting evaluation. However, no evidence of obstruction is identified. No free air, portal venous gas, or pneumatosis. No renal or ureteral stones are identified. No acute abnormalities. IMPRESSION: There is a paucity of bowel gas limiting evaluation. No acute abnormalities are noted within this limitation. Electronically Signed   By: Dorise Bullion III M.D   On: 11/02/2019 14:22   CT Head Wo Contrast  Result Date: 10/27/2019 CLINICAL DATA:  Delirium, confusion EXAM: CT HEAD WITHOUT CONTRAST TECHNIQUE: Contiguous axial images  were obtained from the base of the skull through the vertex without intravenous contrast. COMPARISON:  None. FINDINGS: Brain: Normal anatomic configuration. No abnormal intra or extra-axial mass lesion or fluid collection. No abnormal mass effect or midline shift. No evidence of acute intracranial hemorrhage or infarct. Ventricular size is normal. Cerebellum unremarkable.  Vascular: Unremarkable Skull: Intact Sinuses/Orbits: Paranasal sinuses are clear. Orbits are unremarkable. Other: Mastoid air cells and middle ear cavities are clear. IMPRESSION: No acute intracranial hemorrhage or infarct. Electronically Signed   By: Fidela Salisbury MD   On: 10/27/2019 04:02   US RENAL  Result Date: 10/27/2019 CLINICAL DATA:  Acute renal failure. EXAM: RENAL / URINARY TRACT ULTRASOUND COMPLETE COMPARISON:  09/18/2016 FINDINGS: Right Kidney: Renal measurements: 9.7 x 4.8 x 4.5 cm = volume: 109 mL. Increased echogenicity. No mass or hydronephrosis visualized. Left Kidney: Renal measurements: 9.9 x 5.0 x 3.8 cm = volume: 100 mL. Increased echogenicity. No mass or hydronephrosis visualized. Bladder: Appears normal for degree of bladder distention. Other: A cystic mass anterior to the bladder measures 7.1 x 7.0 x 8.0 cm, not evident on the prior ultrasound. Multiple small stones were noted in the gallbladder, not evaluated in detail on this renal ultrasound. IMPRESSION: 1. Echogenic kidneys compatible with medical renal disease. No hydronephrosis. 2. 8 cm cystic mass anterior to the bladder. Consider CT for further evaluation. 3. Cholelithiasis. Electronically Signed   By: Logan Bores M.D.   On: 10/27/2019 06:51   DG Chest Port 1 View  Result Date: 10/27/2019 CLINICAL DATA:  Altered mental status. EXAM: PORTABLE CHEST 1 VIEW COMPARISON:  03/30/2019 FINDINGS: Telemetry leads overlie the chest. The cardiomediastinal silhouette is unchanged with normal heart size. The lungs are hypoinflated with mild hazy opacity in the left mid  lung. No sizable pleural effusion or pneumothorax is identified. No acute osseous abnormality is seen. IMPRESSION: Hypoinflation with mild hazy left mid lung opacity which may reflect atelectasis or pneumonia. Electronically Signed   By: Logan Bores M.D.   On: 10/27/2019 04:00    Microbiology: Recent Results (from the past 240 hour(s))  Respiratory Panel by RT PCR (Flu A&B, Covid) - Nasopharyngeal Swab     Status: None   Collection Time: 10/27/19  3:58 AM   Specimen: Nasopharyngeal Swab  Result Value Ref Range Status   SARS Coronavirus 2 by RT PCR NEGATIVE NEGATIVE Final    Comment: (NOTE) SARS-CoV-2 target nucleic acids are NOT DETECTED.  The SARS-CoV-2 RNA is generally detectable in upper respiratoy specimens during the acute phase of infection. The lowest concentration of SARS-CoV-2 viral copies this assay can detect is 131 copies/mL. A negative result does not preclude SARS-Cov-2 infection and should not be used as the sole basis for treatment or other patient management decisions. A negative result may occur with  improper specimen collection/handling, submission of specimen other than nasopharyngeal swab, presence of viral mutation(s) within the areas targeted by this assay, and inadequate number of viral copies (<131 copies/mL). A negative result must be combined with clinical observations, patient history, and epidemiological information. The expected result is Negative.  Fact Sheet for Patients:  PinkCheek.be  Fact Sheet for Healthcare Providers:  GravelBags.it  This test is no t yet approved or cleared by the [place] FDA and  has been authorized for detection and/or diagnosis of SARS-CoV-2 by FDA under an Emergency Use Authorization (EUA). This EUA will remain  in effect (meaning this test can be used) for the duration of the COVID-19 declaration under Section 564(b)(1) of the Act, 21 U.S.C. section  360bbb-3(b)(1), unless the authorization is terminated or revoked sooner.     Influenza A by PCR NEGATIVE NEGATIVE Final   Influenza B by PCR NEGATIVE NEGATIVE Final    Comment: (NOTE) The Xpert Xpress SARS-CoV-2/FLU/RSV assay is intended as an aid in  the  diagnosis of influenza from Nasopharyngeal swab specimens and  should not be used as a sole basis for treatment. Nasal washings and  aspirates are unacceptable for Xpert Xpress SARS-CoV-2/FLU/RSV  testing.  Fact Sheet for Patients: PinkCheek.be  Fact Sheet for Healthcare Providers: GravelBags.it  This test is not yet approved or cleared by the [place] FDA and  has been authorized for detection and/or diagnosis of SARS-CoV-2 by  FDA under an Emergency Use Authorization (EUA). This EUA will remain  in effect (meaning this test can be used) for the duration of the  Covid-19 declaration under Section 564(b)(1) of the Act, 21  U.S.C. section 360bbb-3(b)(1), unless the authorization is  terminated or revoked. Performed at [facility], [place] [address], [place], [place] [address]   Urine culture     Status: None   Collection Time: 10/27/19  3:58 AM   Specimen: Urine, Catheterized  Result Value Ref Range Status   Specimen Description   Final    URINE, CATHETERIZED Performed at [place] [address], [place], [place] [address]    Special Requests   Final    NONE Performed at [facility], [place] [address] [place], [place] [address]    Culture   Final    NO GROWTH Performed at [facility], [place] [address] [place], [place] [address]    Report Status 10/28/2019 FINAL  Final  Gastrointestinal Panel by PCR , Stool     Status: None   Collection Time: 10/28/19 12:36 PM   Specimen: Stool  Result Value Ref Range Status   Campylobacter species NOT DETECTED NOT DETECTED Final   Plesimonas shigelloides  NOT DETECTED NOT DETECTED Final   Salmonella species NOT DETECTED NOT DETECTED Final   Yersinia enterocolitica NOT DETECTED NOT DETECTED Final   Vibrio species NOT DETECTED NOT DETECTED Final   Vibrio cholerae NOT DETECTED NOT DETECTED Final   Enteroaggregative E coli (EAEC) NOT DETECTED NOT DETECTED Final   Enteropathogenic E coli (EPEC) NOT DETECTED NOT DETECTED Final   Enterotoxigenic E coli (ETEC) NOT DETECTED NOT DETECTED Final   Shiga like toxin producing E coli (STEC) NOT DETECTED NOT DETECTED Final   Shigella/Enteroinvasive E coli (EIEC) NOT DETECTED NOT DETECTED Final   Cryptosporidium NOT DETECTED NOT DETECTED Final   Cyclospora cayetanensis NOT DETECTED NOT DETECTED Final   Entamoeba histolytica NOT DETECTED NOT DETECTED Final   Giardia lamblia NOT DETECTED NOT DETECTED Final   Adenovirus F40/41 NOT DETECTED NOT DETECTED Final   Astrovirus NOT DETECTED NOT DETECTED Final   Norovirus GI/GII NOT DETECTED NOT DETECTED Final   Rotavirus A NOT DETECTED NOT DETECTED Final   Sapovirus (I, II, IV, and V) NOT DETECTED NOT DETECTED Final    Comment: Performed at [facility], [place] [place], [place] [address]  C Difficile Quick Screen w PCR reflex     Status: Abnormal   Collection Time: 10/28/19 12:36 PM   Specimen: Stool  Result Value Ref Range Status   C Diff antigen POSITIVE (A) NEGATIVE Final   C Diff toxin NEGATIVE NEGATIVE Final   C Diff interpretation Results are indeterminate. See PCR results.  Final    Comment: Performed at [facility], [place] [address], [place],  [address]  C. Diff by PCR, Reflexed     Status: None   Collection Time: 10/28/19 12:36 PM  Result Value Ref Range Status   Toxigenic C. Difficile by PCR NEGATIVE NEGATIVE Final  Comment: Patient is colonized with non toxigenic C. difficile. May not need treatment unless significant symptoms are present. Performed at [facility], [place] [address], [place],  [place] [address]      Labs: Basic Metabolic Panel: Recent Labs  Lab 10/29/19 0631 10/30/19 0820 10/31/19 0420 11/01/19 0558 11/02/19 0508  NA 135 133* 138 143 137  K 3.4* 3.1* 3.5 4.0 3.8  CL 101 101 105 109 107  CO2 25 23 23 24 23   GLUCOSE 99 97 103* 114* 102*  BUN 59* 36* 22* 14 11  CREATININE 1.55* 1.35* 1.19* 1.27* 1.22*  CALCIUM 8.1* 8.1* 8.3* 8.7* 8.3*  MG 1.6*  --   --   --   --   PHOS 1.6* 2.2*  2.2* 3.7  3.6 3.6 3.6   Liver Function Tests: Recent Labs  Lab 10/27/19 0358 10/27/19 1041 10/28/19 0452 10/28/19 0452 10/29/19 0631 10/30/19 0820 10/31/19 0420 11/01/19 0558 11/02/19 0508  AST 40  --  29  --  33  --   --   --   --   ALT 31  --  30  --  31  --   --   --   --   ALKPHOS 175*  --  127*  --  108  --   --   --   --   BILITOT 1.4*  --  0.6  --  0.6  --   --   --   --   PROT 6.8  --  5.7*  --  4.9*  --   --   --   --   ALBUMIN 3.1*   < > 2.6*  2.6*   < > 2.3* 2.5* 2.5* 2.5* 2.5*   < > = values in this interval not displayed.   No results for input(s): LIPASE, AMYLASE in the last 168 hours. Recent Labs  Lab 10/27/19 0358  AMMONIA 16   CBC: Recent Labs  Lab 10/27/19 0358 10/27/19 0358 10/27/19 0523 10/27/19 1041 10/28/19 0452 10/29/19 0631 10/31/19 0420  WBC 8.5  --   --  7.4 9.2 10.1 10.1  NEUTROABS 5.9  --   --   --   --   --  7.2  HGB 13.3   < > 14.3 12.1 11.6* 10.4* 10.4*  HCT 43.1   < > 42.0 38.5 35.9* 32.2* 32.4*  MCV 91.3  --   --  89.1 86.3 88.2 87.3  PLT 310  --   --  304 270 234 259   < > = values in this interval not displayed.   Cardiac Enzymes: Recent Labs  Lab 10/27/19 0851  CKTOTAL 547*   BNP: BNP (last 3 results) No results for input(s): BNP in the last 8760 hours.  ProBNP (last 3 results) No results for input(s): PROBNP in the last 8760 hours.  CBG: Recent Labs  Lab 11/01/19 0610 11/01/19 1659 11/01/19 2337 11/02/19 0622 11/02/19 1120  GLUCAP 106* 138* 99 99 91       Signed:  Florencia Reasons MD, PhD,  FACP  [facility] 11/02/2019, 5:34 PM

## 2019-11-06 ENCOUNTER — Ambulatory Visit
Admission: EM | Admit: 2019-11-06 | Discharge: 2019-11-06 | Disposition: A | Discharge status: Home / Self Care | Payer: Medicare Other

## 2019-11-06 ENCOUNTER — Telehealth (INDEPENDENT_AMBULATORY_CARE_PROVIDER_SITE_OTHER): Payer: Medicaid Other | Admitting: Psychiatry

## 2019-11-06 ENCOUNTER — Other Ambulatory Visit: Payer: Self-pay

## 2019-11-06 DIAGNOSIS — R197 Diarrhea, unspecified: Secondary | ICD-10-CM

## 2019-11-06 DIAGNOSIS — F4310 Post-traumatic stress disorder, unspecified: Secondary | ICD-10-CM

## 2019-11-06 DIAGNOSIS — F250 Schizoaffective disorder, bipolar type: Secondary | ICD-10-CM | POA: Diagnosis not present

## 2019-11-06 DIAGNOSIS — E1142 Type 2 diabetes mellitus with diabetic polyneuropathy: Secondary | ICD-10-CM | POA: Diagnosis not present

## 2019-11-06 MED ORDER — SACCHAROMYCES BOULARDII 250 MG PO CAPS: 250.0000 mg | ORAL_CAPSULE | Freq: Two times a day (BID) | ORAL | 0 refills | Status: DC

## 2019-11-06 MED ORDER — ASENAPINE MALEATE 10 MG SL SUBL
10.0000 mg | SUBLINGUAL_TABLET | Freq: Three times a day (TID) | SUBLINGUAL | 2 refills | Status: DC
Start: 2019-11-06 — End: 2019-11-10

## 2019-11-06 MED ORDER — LISDEXAMFETAMINE DIMESYLATE 70 MG PO CAPS
70.0000 mg | ORAL_CAPSULE | Freq: Every day | ORAL | 0 refills | Status: DC
Start: 2019-11-06 — End: 2019-12-10

## 2019-11-06 NOTE — ED Provider Notes (Signed)
[facility]    CSN: [id number] Arrival date & time: 11/06/19  1533      History   Chief Complaint Chief Complaint  Patient presents with  . Diarrhea    HPI Tiffany [name] is a 59 y.o. female.   59 year old female comes in for continue diarrhea after being admitted for c diff 10/27/2019. She was discharged on vancomycin and probiotics. Since then, her diarrhea has improved. States foul smelling stool has resolved. Still with diarrhea, but no longer as watery. She finished her last dose of vancomycin today, and worries given still having some diarrhea. States yesterday without BM. Today has had 2 slightly loose stools. Denies abdominal pain, fever.      Past Medical History:  Diagnosis Date  . Agitation 11/22/2017  . Anoxic brain injury ([place]) 09/08/2016   C. Arrest due to respiratory failure and COPD exacerbation  . Anxiety   . Arthritis    "all over" (04/10/2016)  . Asthma 10/18/2010  . Binge eating disorder   . Cardiac arrest ([place]) 09/08/2016   PEA  . Carotid artery stenosis    1-39% bilateral by dopplers 11/2016  . Chronic diastolic (congestive) heart failure ([place])   . Chronic kidney disease    "I see a kidney dr." (04/10/2016)  . Chronic pain syndrome 06/18/2012  . Chronic post-traumatic stress disorder (PTSD) 05/27/2018  . Chronic respiratory failure with hypoxia and hypercapnia (HCC) 06/22/2015   TRILOGY Vent >AVAPA-ES., Vt target 200-400, Max P 30 , PS max 20 , PS min 6-10 , E Max 6, E Min 4, Rate Auto AVAPS Rate 2 (titrate for pt comfort) , bleed O2 at 5l/m continuous flow .   [name] CKD (chronic kidney disease) stage 3, GFR 30-59 ml/min (HCC) 12/15/2016  . Closed displaced fracture of fifth metacarpal bone 03/21/2018  . Cocaine use disorder, severe, in sustained remission ([place]) 12/17/2015  . Complication of anesthesia    decreased bp, decreased heart rate  . COPD (chronic obstructive pulmonary disease) ([place]) 07/08/2014  . Depression   . Diabetic neuropathy ([place])  04/24/2011  . Difficulty with speech 01/24/2018  . Disorder of nervous system   . Drug abuse ([place]) 11/21/2017  . Dyslipidemia 04/24/2011  . Elevated troponin 04/28/2012  . Emphysema   . Encephalopathy 11/21/2017  . Essential hypertension 03/22/2016  . Fibula fracture 07/10/2016  . Frequent falls 10/11/2017  . GERD (gastroesophageal reflux disease)   . Gout 04/11/2017  . Heart attack ([place]) 1980s  . History of blood transfusion 1994   "couldn't stop bleeding from my period"  . History of drug abuse in remission ([place]) 11/28/2015   Quit in 2017  . Hyperlipidemia LDL goal <70   . Incontinence   . Manic depression ([place])   . Morbid obesity ([place]) 10/18/2010  . Obstructive sleep apnea 10/18/2010  . On home oxygen therapy    "6L; 24/7" (04/10/2016)  . OSA on CPAP    "wear mask sometimes" (04/10/2016)  . Paranoid ([place])    "sometimes; I'm on RX for it" (04/10/2016)  . Prolonged Q-T interval on ECG   . Rectal bleeding 12/31/2015  . Right carotid bruit 11/09/2016  . Schizoaffective disorder, bipolar type ([place]) 04/05/2018  . Seasonal allergies   . Seborrheic keratoses 12/31/2013  . Seizures ([place])    "don't know what kind; last one was ~ 1 yr ago" (04/10/2016)  . Stroke [facility]) 1980s   denies residual on 04/10/2016  . Thrush 09/19/2013  . Type 2 diabetes mellitus (  [place]) 10/18/2010    Patient Active Problem List   Diagnosis Date Noted  . Pressure injury of skin 10/28/2019  . ARF (acute renal failure) ([place]) 10/27/2019  . Family discord 02/04/2019  . Aggressive behavior   . PTSD (post-traumatic stress disorder) 05/27/2018  . Schizoaffective disorder, bipolar type ([place]) 04/05/2018  . Closed displaced fracture of fifth metacarpal bone 03/21/2018  . Difficulty with speech 01/24/2018  . Encephalopathy 11/21/2017  . Drug abuse ([place]) 11/21/2017  . Frequent falls 10/11/2017  . Binge eating disorder   . Dependence on continuous supplemental oxygen 05/14/2017  . Gout 04/11/2017  . CKD (chronic kidney disease)  stage 3, GFR 30-59 ml/min (HCC) 12/15/2016  . Carotid artery stenosis   . Osteoarthritis 10/26/2016  . Anoxic brain injury ([place]) 09/08/2016  . Overactive bladder 06/07/2016  . QT prolongation   . OSA and COPD overlap syndrome ([place])   . Arthritis   . Essential hypertension 03/22/2016  . Cocaine use disorder, severe, in sustained remission ([place]) 12/17/2015  . History of drug abuse in remission ([place]) 11/28/2015  . Chronic diastolic congestive heart failure ([place])   . Chronic respiratory failure with hypoxia and hypercapnia ([place]) 06/22/2015  . Tobacco use disorder 07/22/2014  . COPD (chronic obstructive pulmonary disease) ([place]) 07/08/2014  . Seizure ([place]) 01/04/2013  . Chronic pain syndrome 06/18/2012  . Dyslipidemia 04/24/2011  . Diabetic neuropathy ([place]) 04/24/2011  . Morbid obesity ([place]) 10/18/2010  . Type 2 diabetes mellitus ([place]) 10/18/2010    Past Surgical History:  Procedure Laterality Date  . CESAREAN SECTION  1997  . HERNIA REPAIR    . IR CHOLANGIOGRAM EXISTING TUBE  07/20/2016  . IR PERC CHOLECYSTOSTOMY  05/10/2016  . IR RADIOLOGIST EVAL & MGMT  06/08/2016  . IR RADIOLOGIST EVAL & MGMT  06/29/2016  . IR SINUS/FIST TUBE CHK-NON GI  07/12/2016  . RIGHT/LEFT HEART CATH AND CORONARY ANGIOGRAPHY N/A 06/19/2017   Procedure: RIGHT/LEFT HEART CATH AND CORONARY ANGIOGRAPHY;  Surgeon: Jolaine Artist, MD;  Location: [facility];  Service: Cardiovascular;  Laterality: N/A;  . TIBIA IM NAIL INSERTION Right 07/12/2016   Procedure: INTRAMEDULLARY (IM) NAIL RIGHT TIBIA;  Surgeon: Leandrew Koyanagi, MD;  Location: [facility];  Service: Orthopedics;  Laterality: Right;  . UMBILICAL HERNIA REPAIR  ~ 1963   "that's why I don't have a belly button"  . VAGINAL HYSTERECTOMY      OB History    Gravida  4   Para  4   Term  3   Preterm  1   AB  0   Living  3     SAB  0   TAB  0   Ectopic  0   Multiple  0   Live Births  3            Home Medications    Prior to Admission  medications   Medication Sig Start Date End Date Taking? Authorizing Provider  albuterol (VENTOLIN HFA) 108 (90 Base) MCG/ACT inhaler Inhale 2 puffs into the lungs every 6 (six) hours as needed for wheezing or shortness of breath. 10/09/19  Yes Collene Gobble, MD  Asenapine Maleate 10 MG SUBL Place 1 tablet (10 mg total) under the tongue in the morning, at noon, and at bedtime. 11/06/19 02/04/20 Yes Pucilowski, Olgierd A, MD  atorvastatin (LIPITOR) 80 MG tablet Take 1 tablet (80 mg total) by mouth daily. 12/10/18  Yes Maximiano Coss, NP  colchicine 0.6 MG tablet Take 1 tablet (0.6  mg total) by mouth 2 (two) times daily. Patient taking differently: Take 1.2 mg by mouth 2 (two) times daily.  09/08/19  Yes [name], DO  diclofenac (VOLTAREN) 75 MG EC tablet Take 75 mg by mouth 2 (two) times daily.   Yes [provider]  diclofenac Sodium (VOLTAREN) 1 % GEL Apply 2 g topically 4 (four) times daily. 09/10/19  Yes [name], DO  febuxostat (ULORIC) 40 MG tablet Take 1 tablet (40 mg total) by mouth daily. 09/11/19  Yes [name], DO  ferrous sulfate 325 (65 FE) MG tablet Take 1 tablet (325 mg total) by mouth daily with breakfast. 11/02/19  Yes Florencia Reasons, MD  Fluticasone-Umeclidin-Vilant (TRELEGY ELLIPTA) 100-62.5-25 MCG/INH AEPB Inhale 1 puff into the lungs daily. 10/09/19  Yes Collene Gobble, MD  gabapentin (NEURONTIN) 600 MG tablet Take 1 tablet (600 mg total) by mouth 2 (two) times daily. 08/04/19  Yes Pucilowski, Olgierd A, MD  montelukast (SINGULAIR) 10 MG tablet Take 10 mg by mouth at bedtime. 03/10/19  Yes [provider]  Multiple Vitamin (MULTIVITAMIN WITH MINERALS) TABS tablet Take 1 tablet by mouth daily.   Yes [provider]  oxybutynin (DITROPAN-XL) 10 MG 24 hr tablet Take 10 mg by mouth at bedtime.   Yes [provider]  potassium chloride SA (KLOR-CON) 20 MEQ tablet Take 2 tablets (40 mEq total) by mouth daily.  11/02/19  Yes Florencia Reasons, MD  prazosin (MINIPRESS) 5 MG capsule Take 1 capsule (5 mg total) by mouth 2 (two) times daily. 11/02/19 01/31/20 Yes Florencia Reasons, MD  traZODone (DESYREL) 100 MG tablet Take 1 tablet (100 mg total) by mouth at bedtime as needed for sleep. 10/06/19 01/04/20 Yes Pucilowski, Olgierd A, MD  vancomycin (VANCOCIN HCL) 125 MG capsule Take 2 capsules (250 mg total) by mouth 4 (four) times daily for 7 days. 11/02/19 11/09/19 Yes Florencia Reasons, MD  albuterol (PROVENTIL) (2.5 MG/3ML) 0.083% nebulizer solution Take 3 mLs (2.5 mg total) by nebulization every 6 (six) hours as needed for wheezing or shortness of breath. 04/29/19   Magdalen Spatz, NP  lisdexamfetamine (VYVANSE) 70 MG capsule Take 1 capsule (70 mg total) by mouth daily before breakfast. 11/06/19 12/06/19  Pucilowski, Marchia Bond, MD  saccharomyces boulardii (FLORASTOR) 250 MG capsule Take 1 capsule (250 mg total) by mouth 2 (two) times daily. 11/06/19   Tasia Catchings, Jade Burkard V, PA-C  budesonide-formoterol (SYMBICORT) 80-4.5 MCG/ACT inhaler Inhale 2 puffs into the lungs 2 (two) times daily. Patient not taking: Reported on 09/29/2019 11/15/18 09/29/19  Maximiano Coss, NP    Family History Family History  Problem Relation Age of Onset  . Cancer Father        prostate  . Cancer Mother        lung  . Depression Mother   . Depression Sister   . Anxiety disorder Sister   . Schizophrenia Sister   . Bipolar disorder Sister   . Depression Sister   . Depression Brother   . Heart failure Other        cousin    Social History Social History   Tobacco Use  . Smoking status: Former Smoker    Packs/day: 1.50    Years: 38.00    Pack years: 57.00    Types: Cigarettes    Start date: 03/13/1977    Quit date: 04/10/2016    Years since quitting: 3.5  . Smokeless tobacco: Never Used  Vaping Use  . Vaping Use: Never used  Substance Use Topics  . Alcohol use: No    Alcohol/week: 0.0 standard drinks  . Drug use: No    Types: Cocaine    Comment:  04/10/2016 "last used cocaine back in November 2017"     Allergies   Hydrocodone, Hydrocodone-acetaminophen, Hydroxyzine, Latuda [lurasidone hcl], Lurasidone, Magnesium-containing compounds, Prednisone, Tramadol, Codeine, Other, Sulfa antibiotics, and Tape   Review of Systems Review of Systems  Reason unable to perform ROS: See HPI as above.     Physical Exam Triage Vital Signs ED Triage Vitals  Enc Vitals Group     BP 11/06/19 1628 123/85     Pulse Rate 11/06/19 1627 95     Resp 11/06/19 1627 20     Temp 11/06/19 1627 98.6 F (37 C)     Temp Source 11/06/19 1627 Oral     SpO2 11/06/19 1627 98 %     Weight --      Height --      Head Circumference --      Peak Flow --      Pain Score --      Pain Loc --      Pain Edu? --      Excl. in [place]? --    No data found.  Updated Vital Signs BP 123/85 (BP Location: Left Arm)   Pulse 95   Temp 98.6 F (37 C) (Oral)   Resp 20   SpO2 98%   Visual Acuity Right Eye Distance:   Left Eye Distance:   Bilateral Distance:    Right Eye Near:   Left Eye Near:    Bilateral Near:     Physical Exam Constitutional:      General: [name] not in acute distress.    Appearance: [name] well-developed. [name] not ill-appearing, toxic-appearing or diaphoretic.  HENT:     Head: Normocephalic and atraumatic.  Eyes:     Conjunctiva/sclera: Conjunctivae normal.     Pupils: Pupils are equal, round, and reactive to light.  Cardiovascular:     Rate and Rhythm: Normal rate and regular rhythm.  Pulmonary:     Effort: Pulmonary effort is normal. No respiratory distress.     Comments: LCTAB Abdominal:     General: Bowel sounds are normal.     Palpations: Abdomen is soft.     Tenderness: There is no abdominal tenderness. There is no right CVA tenderness, left CVA tenderness, guarding or rebound.  Musculoskeletal:     Cervical back: Normal range of motion and neck supple.  Skin:    General: Skin is warm and dry.  Neurological:     Mental  Status: [name] alert and oriented to person, place, and time.  Psychiatric:        Behavior: Behavior normal.        Judgment: Judgment normal.      UC Treatments / Results  Labs (all labs ordered are listed, but only abnormal results are displayed) Labs Reviewed - No data to display  EKG   Radiology No results found.  Procedures Procedures (including critical care time)  Medications Ordered in UC Medications - No data to display  Initial Impression / Assessment and Plan / UC Course  I have reviewed the triage vital signs and the nursing notes.  Pertinent labs & imaging results that were available during my care of the patient were reviewed by me and considered in my medical decision making (see chart for details).    Abdomen soft, +BS, nontender  to palpation. Patient diarrhea has greatly improved. Discussed basing on less watery stool without foul smell, c diff likely resolved. Will continue probiotics. Patient has PCP appointment for hospital follow up. To follow up as scheduled for recheck. Return precautions given.  Final Clinical Impressions(s) / UC Diagnoses   Final diagnoses:  Diarrhea, unspecified type    ED Prescriptions    Medication Sig Dispense Auth. Provider   saccharomyces boulardii (FLORASTOR) 250 MG capsule Take 1 capsule (250 mg total) by mouth 2 (two) times daily. 20 capsule Ok Edwards, PA-C     PDMP not reviewed this encounter.   Ok Edwards, PA-C 11/06/19 2312

## 2019-11-06 NOTE — Discharge Instructions (Addendum)
As discussed, you may still have some diarrhea until intestines are healed. Continue probiotics for now. Keep hydrated, urine should be clear to pale yellow in color. Follow up with PCP for recheck as per hospital directions. If worsening symptoms, abdominal pain, fever, go to the ED for further evaluation.

## 2019-11-06 NOTE — Progress Notes (Signed)
BH MD/PA/NP OP Progress Note  11/06/2019 9:42 AM Tiffany [name]  MRN:  [id number] Interview was conducted by phone and I verified that I was speaking with the correct person using two identifiers. I discussed the limitations of evaluation and management by telemedicine and  the availability of in person appointments. Patient expressed understanding and agreed to proceed. Patient location - home; physician - home office.  Chief Complaint: "I am still having diarrhea".  HPI: 59yo divorced AAF with schizoaffective disorder bipolar type(vs paranoid schizophrenia),chronicPTSD,remote hx ofcocaineaddiction (clean for4years)and bingeeating disorder.She tried ziprazidone but developedQTC prolongation on itso it was changed toSaphriswhich she believes is a best medication she has ever been on. [name] also on escitalopram andwas onVyvanse for binge eating disorder. Tiffany [name] used to weigh over 300 lbs and since starting Vyvanse her binge eating is well controlled and she was able to lose a lot of weight). She continues to take diazepam 10 mgtidand trazodone for sleep (100 mg plus 50 mg in am "for nerves").She has a strained relationship with her family - mother in particular, but also son.  [name] not comfortable living with her mother but has an independent apartment assigned and hopes to move there within a month. She has been hospitalized at [facility] long eEarlier this month with colitis and AKF - she was discharged on vamcomycin but still reports having diarrhea and feeling weak. Diazepam and escitalopram were dc in the hospital. She sound less paranoid than durinmg last few visits and reports being compliant with her medications.   Dx: Schizoaffective disorder bipolar type; PTSD chronic  Plan: Continue trazodone 100 mg prn sleep,Saphris10 mgtid,Vyvanse 70 mg,prazosin 5 mg bid, gabapentin 600 bid.Next appointment in4weeks. The plan was discussed with patient who had an opportunity to  ask questions and these were all answered.I spend45minutes in phone consultation with the patient.  Visit Diagnosis:    ICD-10-CM   1. Schizoaffective disorder, bipolar type ([place])  F25.0   2. Diabetic polyneuropathy associated with type 2 diabetes mellitus (HCC)  E11.42   3. PTSD (post-traumatic stress disorder)  F43.10     Past Psychiatric History: Please see intake H&P.  Past Medical History:  Past Medical History:  Diagnosis Date  . Agitation 11/22/2017  . Anoxic brain injury ([place]) 09/08/2016   C. Arrest due to respiratory failure and COPD exacerbation  . Anxiety   . Arthritis    "all over" (04/10/2016)  . Asthma 10/18/2010  . Binge eating disorder   . Cardiac arrest ([place]) 09/08/2016   PEA  . Carotid artery stenosis    1-39% bilateral by dopplers 11/2016  . Chronic diastolic (congestive) heart failure ([place])   . Chronic kidney disease    "I see a kidney dr." (04/10/2016)  . Chronic pain syndrome 06/18/2012  . Chronic post-traumatic stress disorder (PTSD) 05/27/2018  . Chronic respiratory failure with hypoxia and hypercapnia (HCC) 06/22/2015   TRILOGY Vent >AVAPA-ES., Vt target 200-400, Max P 30 , PS max 20 , PS min 6-10 , E Max 6, E Min 4, Rate Auto AVAPS Rate 2 (titrate for pt comfort) , bleed O2 at 5l/m continuous flow .   [name] CKD (chronic kidney disease) stage 3, GFR 30-59 ml/min (HCC) 12/15/2016  . Closed displaced fracture of fifth metacarpal bone 03/21/2018  . Cocaine use disorder, severe, in sustained remission ([place]) 12/17/2015  . Complication of anesthesia    decreased bp, decreased heart rate  . COPD (chronic obstructive pulmonary disease) ([place]) 07/08/2014  . Depression   . Diabetic  neuropathy ([place]) 04/24/2011  . Difficulty with speech 01/24/2018  . Disorder of nervous system   . Drug abuse ([place]) 11/21/2017  . Dyslipidemia 04/24/2011  . Elevated troponin 04/28/2012  . Emphysema   . Encephalopathy 11/21/2017  . Essential hypertension 03/22/2016  . Fibula fracture 07/10/2016  .  Frequent falls 10/11/2017  . GERD (gastroesophageal reflux disease)   . Gout 04/11/2017  . Heart attack ([place]) 1980s  . History of blood transfusion 1994   "couldn't stop bleeding from my period"  . History of drug abuse in remission ([place]) 11/28/2015   Quit in 2017  . Hyperlipidemia LDL goal <70   . Incontinence   . Manic depression ([place])   . Morbid obesity ([place]) 10/18/2010  . Obstructive sleep apnea 10/18/2010  . On home oxygen therapy    "6L; 24/7" (04/10/2016)  . OSA on CPAP    "wear mask sometimes" (04/10/2016)  . Paranoid ([place])    "sometimes; I'm on RX for it" (04/10/2016)  . Prolonged Q-T interval on ECG   . Rectal bleeding 12/31/2015  . Right carotid bruit 11/09/2016  . Schizoaffective disorder, bipolar type ([place]) 04/05/2018  . Seasonal allergies   . Seborrheic keratoses 12/31/2013  . Seizures ([place])    "don't know what kind; last one was ~ 1 yr ago" (04/10/2016)  . Stroke [facility]) 1980s   denies residual on 04/10/2016  . Thrush 09/19/2013  . Type 2 diabetes mellitus ([place]) 10/18/2010    Past Surgical History:  Procedure Laterality Date  . CESAREAN SECTION  1997  . HERNIA REPAIR    . IR CHOLANGIOGRAM EXISTING TUBE  07/20/2016  . IR PERC CHOLECYSTOSTOMY  05/10/2016  . IR RADIOLOGIST EVAL & MGMT  06/08/2016  . IR RADIOLOGIST EVAL & MGMT  06/29/2016  . IR SINUS/FIST TUBE CHK-NON GI  07/12/2016  . RIGHT/LEFT HEART CATH AND CORONARY ANGIOGRAPHY N/A 06/19/2017   Procedure: RIGHT/LEFT HEART CATH AND CORONARY ANGIOGRAPHY;  Surgeon: Jolaine Artist, MD;  Location: [place] [facility];  Service: Cardiovascular;  Laterality: N/A;  . TIBIA IM NAIL INSERTION Right 07/12/2016   Procedure: INTRAMEDULLARY (IM) NAIL RIGHT TIBIA;  Surgeon: Leandrew Koyanagi, MD;  Location: [place];  Service: Orthopedics;  Laterality: Right;  . UMBILICAL HERNIA REPAIR  ~ 1963   "that's why I don't have a belly button"  . VAGINAL HYSTERECTOMY      Family Psychiatric History: Reviewed.  Family History:  Family History   Problem Relation Age of Onset  . Cancer Father        prostate  . Cancer Mother        lung  . Depression Mother   . Depression Sister   . Anxiety disorder Sister   . Schizophrenia Sister   . Bipolar disorder Sister   . Depression Sister   . Depression Brother   . Heart failure Other        cousin    Social History:  Social History   Socioeconomic History  . Marital status: Widowed    Spouse name: Not on file  . Number of children: 3  . Years of education: Not on file  . Highest education level: Not on file  Occupational History  . Occupation: disabled    Comment: factory production  Tobacco Use  . Smoking status: Former Smoker    Packs/day: 1.50    Years: 38.00    Pack years: 57.00    Types: Cigarettes    Start date: 03/13/1977    Quit date:  04/10/2016    Years since quitting: 3.5  . Smokeless tobacco: Never Used  Vaping Use  . Vaping Use: Never used  Substance and Sexual Activity  . Alcohol use: No    Alcohol/week: 0.0 standard drinks  . Drug use: No    Types: Cocaine    Comment: 04/10/2016 "last used cocaine back in November 2017"  . Sexual activity: Not Currently    Birth control/protection: Surgical  Other Topics Concern  . Not on file  Social History Narrative   Has 1 son, [name]   Lives with son and his boyfriend   Her house has ramps and handrails should she ever needs them.    Her mother lives down the street from her and is a good support person in addition to her son.   She drives herself, has private transportation.    Cocaine free since 02/24/16, smoke free since 04/10/16   Social Determinants of Health   Financial Resource Strain:   . Difficulty of Paying Living Expenses: Not on file  Food Insecurity:   . Worried About Charity fundraiser in the Last Year: Not on file  . Ran Out of Food in the Last Year: Not on file  Transportation Needs:   . Lack of Transportation (Medical): Not on file  . Lack of Transportation (Non-Medical): Not on file   Physical Activity:   . Days of Exercise per Week: Not on file  . Minutes of Exercise per Session: Not on file  Stress:   . Feeling of Stress : Not on file  Social Connections:   . Frequency of Communication with Friends and Family: Not on file  . Frequency of Social Gatherings with Friends and Family: Not on file  . Attends Religious Services: Not on file  . Active Member of Clubs or Organizations: Not on file  . Attends Archivist Meetings: Not on file  . Marital Status: Not on file    Allergies:  Allergies  Allergen Reactions  . Hydrocodone Shortness Of Breath  . Hydrocodone-Acetaminophen Shortness Of Breath  . Hydroxyzine Anaphylaxis and Shortness Of Breath  . Latuda [Lurasidone Hcl] Anaphylaxis  . Lurasidone Anaphylaxis  . Magnesium-Containing Compounds Anaphylaxis  . Prednisone Anaphylaxis, Swelling and Other (See Comments)    Tongue swelling  . Tramadol Anaphylaxis and Swelling  . Codeine Nausea And Vomiting  . Other Rash  . Sulfa Antibiotics Itching  . Tape Rash    Metabolic Disorder Labs: Lab Results  Component Value Date   HGBA1C 5.6 11/08/2018   MPG 125.5 01/07/2017   MPG 111.15 11/03/2016   No results found for: PROLACTIN Lab Results  Component Value Date   CHOL 127 08/05/2018   TRIG 53 08/05/2018   HDL 62 08/05/2018   CHOLHDL 2.0 08/05/2018   VLDL 38 01/07/2017   LDLCALC 54 08/05/2018   LDLCALC 56 01/07/2017   Lab Results  Component Value Date   TSH 1.233 10/27/2019   TSH 0.563 03/15/2016    Therapeutic Level Labs: No results found for: LITHIUM Lab Results  Component Value Date   VALPROATE 20 (L) 09/09/2016   VALPROATE 46 (L) 07/10/2016   No components found for:  CBMZ  Current Medications: Current Outpatient Medications  Medication Sig Dispense Refill  . albuterol (PROVENTIL) (2.5 MG/3ML) 0.083% nebulizer solution Take 3 mLs (2.5 mg total) by nebulization every 6 (six) hours as needed for wheezing or shortness of breath. 75  mL 5  . albuterol (VENTOLIN HFA) 108 (90 Base) MCG/ACT inhaler  Inhale 2 puffs into the lungs every 6 (six) hours as needed for wheezing or shortness of breath. 18 g 2  . Asenapine Maleate 10 MG SUBL Place 1 tablet (10 mg total) under the tongue in the morning, at noon, and at bedtime. 90 tablet 2  . atorvastatin (LIPITOR) 80 MG tablet Take 1 tablet (80 mg total) by mouth daily. 90 tablet 0  . colchicine 0.6 MG tablet Take 1 tablet (0.6 mg total) by mouth 2 (two) times daily. (Patient taking differently: Take 1.2 mg by mouth 2 (two) times daily. ) 180 tablet 0  . diclofenac Sodium (VOLTAREN) 1 % GEL Apply 2 g topically 4 (four) times daily. 350 g 0  . febuxostat (ULORIC) 40 MG tablet Take 1 tablet (40 mg total) by mouth daily. 90 tablet 0  . ferrous sulfate 325 (65 FE) MG tablet Take 1 tablet (325 mg total) by mouth daily with breakfast. 180 tablet 0  . Fluticasone-Umeclidin-Vilant (TRELEGY ELLIPTA) 100-62.5-25 MCG/INH AEPB Inhale 1 puff into the lungs daily. 60 each 3  . gabapentin (NEURONTIN) 600 MG tablet Take 1 tablet (600 mg total) by mouth 2 (two) times daily. 180 tablet 1  . lisdexamfetamine (VYVANSE) 70 MG capsule Take 1 capsule (70 mg total) by mouth daily before breakfast. 30 capsule 0  . montelukast (SINGULAIR) 10 MG tablet Take 10 mg by mouth at bedtime.    . Multiple Vitamin (MULTIVITAMIN WITH MINERALS) TABS tablet Take 1 tablet by mouth daily.    . potassium chloride SA (KLOR-CON) 20 MEQ tablet Take 2 tablets (40 mEq total) by mouth daily. 360 tablet 1  . prazosin (MINIPRESS) 5 MG capsule Take 1 capsule (5 mg total) by mouth 2 (two) times daily. 60 capsule 2  . saccharomyces boulardii (FLORASTOR) 250 MG capsule Take 1 capsule (250 mg total) by mouth 2 (two) times daily for 10 days. 20 capsule 0  . traZODone (DESYREL) 100 MG tablet Take 1 tablet (100 mg total) by mouth at bedtime as needed for sleep. 30 tablet 2  . vancomycin (VANCOCIN HCL) 125 MG capsule Take 2 capsules (250 mg total)  by mouth 4 (four) times daily for 7 days. 56 capsule 0   No current facility-administered medications for this visit.     Psychiatric Specialty Exam: Review of Systems  Constitutional: Positive for fatigue.  Gastrointestinal: Positive for diarrhea.  Musculoskeletal: Positive for myalgias.  All other systems reviewed and are negative.   There were no vitals taken for this visit.There is no height or weight on file to calculate BMI.  General Appearance: NA  Eye Contact:  NA  Speech:  Clear and Coherent and Normal Rate  Volume:  Normal  Mood:  "Pretty good"  Affect:  NA  Thought Process:  Goal Directed and Linear  Orientation:  Full (Time, Place, and Person)  Thought Content: Logical   Suicidal Thoughts:  No  Homicidal Thoughts:  No  Memory:  Immediate;   Good Recent;   Good Remote;   Good  Judgement:  Fair  Insight:  Fair  Psychomotor Activity:  NA  Concentration:  Concentration: Good  Recall:  Good  Fund of Knowledge: Good  Language: Good  Akathisia:  Negative  Handed:  Right  AIMS (if indicated): not done  Assets:  Communication Skills Desire for Improvement Financial Resources/Insurance Resilience  ADL's:  Intact  Cognition: WNL  Sleep:  Fair   Screenings: AUDIT     Admission (Discharged) from 12/01/2015 in [place]  Alcohol  Use Disorder Identification Test Final Score (AUDIT) 0    GAD-7     Office Visit from 03/21/2018 in [place] for [facility]  Total GAD-7 Score 4    PHQ2-9     Office Visit from confidential encounter on 12/11/2018 Office Visit from confidential encounter on 12/10/2018 Office Visit from confidential encounter on 11/18/2018 Office Visit from confidential encounter on 11/08/2018 Telemedicine from confidential encounter on 10/25/2018  PHQ-2 Total Score 0 4 2 1 4   PHQ-9 Total Score -- 10 8 1 16        Assessment and Plan: 59yo divorced AAF with schizoaffective disorder bipolar type(vs paranoid  schizophrenia),chronicPTSD,remote hx ofcocaineaddiction (clean for4years)and bingeeating disorder.She tried ziprazidone but developedQTC prolongation on itso it was changed toSaphriswhich she believes is a best medication she has ever been on. [name] also on escitalopram andwas onVyvanse for binge eating disorder. Khrystina used to weigh over 300 lbs and since starting Vyvanse her binge eating is well controlled and she was able to lose a lot of weight). She continues to take diazepam 10 mgtidand trazodone for sleep (100 mg plus 50 mg in am "for nerves").She has a strained relationship with her family - mother in particular, but also son.  [name] not comfortable living with her mother but has an independent apartment assigned and hopes to move there within a month. She has been hospitalized at [facility] long eEarlier this month with colitis and AKF - she was discharged on vamcomycin but still reports having diarrhea and feeling weak. Diazepam and escitalopram were dc in the hospital. She sound less paranoid than durinmg last few visits and reports being compliant with her medications.   Dx: Schizoaffective disorder bipolar type; PTSD chronic  Plan: Continue trazodone 100 mg prn sleep,Saphris10 mgtid,Vyvanse 70 mg,prazosin 5 mg bid, gabapentin 600 bid.Next appointment in4weeks. The plan was discussed with patient who had an opportunity to ask questions and these were all answered.I spend11minutes in phone consultation with the patient.   Stephanie Acre, MD 11/06/2019, 9:42 AM

## 2019-11-06 NOTE — ED Triage Notes (Addendum)
Patient in with c/o diarrhea and nausea that started 8 days ago. States that her stomach is "bubbling and hurts" Just got out of the hospital last saturday  Has been taking vancomycin and florastor   Also said that she has had some vaginal bleeding that started today.  Denies fever, sob  Has a few RX that she would like refilled

## 2019-11-10 ENCOUNTER — Telehealth: Payer: Self-pay

## 2019-11-10 ENCOUNTER — Other Ambulatory Visit (HOSPITAL_COMMUNITY): Payer: Self-pay | Admitting: Psychiatry

## 2019-11-10 MED ORDER — ASENAPINE MALEATE 10 MG SL SUBL
10.0000 mg | SUBLINGUAL_TABLET | Freq: Two times a day (BID) | SUBLINGUAL | 2 refills | Status: DC
Start: 2019-11-10 — End: 2019-12-10

## 2019-11-10 MED ORDER — DIAZEPAM 5 MG PO TABS: 5.0000 mg | ORAL_TABLET | Freq: Three times a day (TID) | ORAL | 1 refills | Status: DC | PRN

## 2019-11-10 NOTE — Telephone Encounter (Signed)
Called patient to inform her that her Asenapine Maleate 10mg  had to be ordered and will be in tomorrow (10/26) and informed her of the new quantity and sig. Patient verbalized understanding. She also requested to be put back on Diazepam that was discontinued while she was in the hospital. [facility]'s using [facility] [address] in [place]. Please review and advise. Thank you.

## 2019-11-10 NOTE — Telephone Encounter (Signed)
I reordered diazepam but at a lower 5 mg dose tid prn; it was dc in hospital because she was excessively sedated!

## 2019-11-11 ENCOUNTER — Ambulatory Visit (INDEPENDENT_AMBULATORY_CARE_PROVIDER_SITE_OTHER): Payer: Medicare Other | Admitting: Psychiatry

## 2019-11-11 ENCOUNTER — Encounter (HOSPITAL_COMMUNITY): Payer: Self-pay | Admitting: Psychiatry

## 2019-11-11 ENCOUNTER — Telehealth: Payer: Self-pay | Admitting: Internal Medicine

## 2019-11-11 ENCOUNTER — Other Ambulatory Visit: Payer: Self-pay

## 2019-11-11 DIAGNOSIS — F4310 Post-traumatic stress disorder, unspecified: Secondary | ICD-10-CM | POA: Diagnosis not present

## 2019-11-11 DIAGNOSIS — F250 Schizoaffective disorder, bipolar type: Secondary | ICD-10-CM | POA: Diagnosis not present

## 2019-11-11 NOTE — Progress Notes (Signed)
Virtual Visit via Telephone Note  I connected with Tiffany [name] on 11/11/19 at  2:30 PM EDT by telephone and verified that I am speaking with the correct person using two identifiers.  Location: Patient: Patient Home Provider: Home Office  I discussed the limitations of evaluation and management by telemedicine and the availability of in person appointments. The patient expressed understanding and agreed to proceed.  History of Present Illness: Schizoeffective DO Bipolar Type and Chronic PTSD  Treatment Plan Goals: Goal 1: Client would like to create and maintain a more stable living environment in order to better manage mental health care and routine with medication management and therapy participation to reduce anxiety and depression. Goal 2: Client would like to process grief/loss issues and trauma experiences to heal from past to move forward in building healthy relationships with others.  Observations/Objective: Counselor met with Tiffany [name] for individual therapy via Webex. Counselor assessed MH symptoms and progress on treatment plan goals, with patient reporting that she feels the most stable she has been mental health wise in over 2 years. Client noted that most issues are related to physical health, with recent life threatening conditions. Client presents with mild depression and moderate anxiety. Client denied suicidal ideation or self-harm behaviors.   Counselor worked to restart the joining process with the client, as we have not held a session together since Feb 2021, due to extended leave and client instability. Counselor provided space for Client to give updates on status and progress. Counselor worked in collaboration with client to update CCA and reestablish treatment plan goals to address current needs. Counselor discussed scheduling upcoming appointments and issues with limited availability until December. Client expressed concern about gap in care. Counselor reminded the  Client that she is welcome to contact between now and then to fill any no show appointments. Client onboard with plan in moving forward.   Assessment and Plan: Patient will continue to keep the office updated on her current contact information.  Patient will continue to follow recommendations of providers and implement skills learned in session.  Follow Up Instructions: Counselor will communicate needs with new provider.   The patient was advised to call back or seek an in-person evaluation if the symptoms worsen or if the condition fails to improve as anticipated.  I provided 35 minutes of non-face-to-face time during this encounter.    , LCSW 

## 2019-11-11 NOTE — Telephone Encounter (Signed)
Patient called and stated that they were out of medication for 4 days. Patient requested a call back from the nurse regarding her medications today.

## 2019-11-12 ENCOUNTER — Other Ambulatory Visit: Payer: Self-pay | Admitting: *Deleted

## 2019-11-12 ENCOUNTER — Telehealth: Payer: Self-pay | Admitting: Physical Medicine and Rehabilitation

## 2019-11-12 DIAGNOSIS — N3281 Overactive bladder: Secondary | ICD-10-CM

## 2019-11-12 DIAGNOSIS — M109 Gout, unspecified: Secondary | ICD-10-CM

## 2019-11-12 DIAGNOSIS — M5442 Lumbago with sciatica, left side: Secondary | ICD-10-CM

## 2019-11-12 DIAGNOSIS — G8929 Other chronic pain: Secondary | ICD-10-CM

## 2019-11-12 MED ORDER — COLCHICINE 0.6 MG PO TABS: 0.6000 mg | ORAL_TABLET | Freq: Two times a day (BID) | ORAL | 0 refills | Status: DC

## 2019-11-12 MED ORDER — DICLOFENAC SODIUM 75 MG PO TBEC: 75.0000 mg | DELAYED_RELEASE_TABLET | Freq: Two times a day (BID) | ORAL | 0 refills | Status: DC

## 2019-11-12 MED ORDER — OXYBUTYNIN CHLORIDE ER 10 MG PO TB24: 10.0000 mg | ORAL_TABLET | Freq: Every day | ORAL | 0 refills | Status: DC

## 2019-11-12 MED ORDER — FEBUXOSTAT 40 MG PO TABS: 40.0000 mg | ORAL_TABLET | Freq: Every day | ORAL | 0 refills | Status: DC

## 2019-11-12 NOTE — Telephone Encounter (Signed)
Dr. Juleen China prescribes 3 medications for patient & they are good until November.

## 2019-11-12 NOTE — Telephone Encounter (Signed)
Patient called needing to schedule an appointment with Dr Ernestina Patches for her back. The number to contact patient is [phone]

## 2019-11-13 ENCOUNTER — Other Ambulatory Visit: Payer: Self-pay | Admitting: Registered Nurse

## 2019-11-13 NOTE — Telephone Encounter (Signed)
I called and advised the patient the MRI has been ordered.

## 2019-11-13 NOTE — Telephone Encounter (Signed)
I called the patient to advise that we cannot schedule her for an injection until Dr. Ernestina Patches sees her MRI. She states that we might have to get a new MRI. Please advise.

## 2019-11-13 NOTE — Telephone Encounter (Signed)
MRI ordered

## 2019-11-14 ENCOUNTER — Telehealth: Payer: Self-pay | Admitting: Internal Medicine

## 2019-11-14 ENCOUNTER — Other Ambulatory Visit: Payer: Self-pay

## 2019-11-14 DIAGNOSIS — E785 Hyperlipidemia, unspecified: Secondary | ICD-10-CM

## 2019-11-14 MED ORDER — ATORVASTATIN CALCIUM 80 MG PO TABS: 80.0000 mg | ORAL_TABLET | Freq: Every day | ORAL | 0 refills | Status: DC

## 2019-11-14 NOTE — Telephone Encounter (Signed)
Febuxostat 40MG  Tablets  KEY:  [id number]  [email or web address] and click "Enter a Key" to complete Prior Authorization.

## 2019-11-14 NOTE — Telephone Encounter (Signed)
Prior Auth approved thru 01/15/2021

## 2019-11-17 DIAGNOSIS — J449 Chronic obstructive pulmonary disease, unspecified: Secondary | ICD-10-CM | POA: Diagnosis not present

## 2019-11-18 NOTE — Telephone Encounter (Signed)
Notified pharmacy/pt 

## 2019-11-25 ENCOUNTER — Ambulatory Visit (INDEPENDENT_AMBULATORY_CARE_PROVIDER_SITE_OTHER): Payer: Medicare Other | Admitting: Licensed Clinical Social Worker

## 2019-11-25 ENCOUNTER — Encounter: Payer: Self-pay | Admitting: Physician Assistant

## 2019-11-25 ENCOUNTER — Other Ambulatory Visit: Payer: Self-pay

## 2019-11-25 ENCOUNTER — Ambulatory Visit (INDEPENDENT_AMBULATORY_CARE_PROVIDER_SITE_OTHER): Payer: Medicare Other | Admitting: Physician Assistant

## 2019-11-25 VITALS — BP 178/98 | HR 79 | Temp 98.2°F | Resp 18

## 2019-11-25 DIAGNOSIS — A6000 Herpesviral infection of urogenital system, unspecified: Secondary | ICD-10-CM | POA: Insufficient documentation

## 2019-11-25 DIAGNOSIS — G4733 Obstructive sleep apnea (adult) (pediatric): Secondary | ICD-10-CM | POA: Diagnosis not present

## 2019-11-25 DIAGNOSIS — F319 Bipolar disorder, unspecified: Secondary | ICD-10-CM

## 2019-11-25 DIAGNOSIS — I10 Essential (primary) hypertension: Secondary | ICD-10-CM | POA: Diagnosis not present

## 2019-11-25 DIAGNOSIS — N9489 Other specified conditions associated with female genital organs and menstrual cycle: Secondary | ICD-10-CM

## 2019-11-25 DIAGNOSIS — R062 Wheezing: Secondary | ICD-10-CM | POA: Diagnosis not present

## 2019-11-25 DIAGNOSIS — R197 Diarrhea, unspecified: Secondary | ICD-10-CM

## 2019-11-25 DIAGNOSIS — N3281 Overactive bladder: Secondary | ICD-10-CM

## 2019-11-25 DIAGNOSIS — J42 Unspecified chronic bronchitis: Secondary | ICD-10-CM | POA: Diagnosis not present

## 2019-11-25 DIAGNOSIS — R600 Localized edema: Secondary | ICD-10-CM

## 2019-11-25 DIAGNOSIS — J45909 Unspecified asthma, uncomplicated: Secondary | ICD-10-CM | POA: Diagnosis not present

## 2019-11-25 MED ORDER — TRELEGY ELLIPTA 100-62.5-25 MCG/INH IN AEPB: 1.0000 | INHALATION_SPRAY | Freq: Every day | RESPIRATORY_TRACT | 3 refills | Status: DC

## 2019-11-25 MED ORDER — OXYBUTYNIN CHLORIDE ER 10 MG PO TB24: 10.0000 mg | ORAL_TABLET | Freq: Every day | ORAL | 0 refills | Status: DC

## 2019-11-25 MED ORDER — ALBUTEROL SULFATE HFA 108 (90 BASE) MCG/ACT IN AERS: 2.0000 | INHALATION_SPRAY | Freq: Four times a day (QID) | RESPIRATORY_TRACT | 2 refills | Status: DC | PRN

## 2019-11-25 NOTE — Progress Notes (Signed)
Established Patient Office Visit  Subjective:  Patient ID: Tiffany [name], female    DOB: Jun 18, 1960  Age: 59 y.o. MRN: [id number]  CC:  Chief Complaint  Patient presents with  . Hospitalization Follow-up    HPI Tiffany [name]  Was hospitalized from 60/11/21 - 11/02/19  History of present illness: (Per admitting provider Dr. Marylyn Ishihara)  Chief Complaint:AMS  BPZ:WCHE Tiffany [name] a 59 y.o.femalewith medical history significant ofCKD3b, DM, HTN, COPD. Presenting with AMS/lethargy. Hx is from chart review as patient is unable to participate and there is no family contact information at time of interview.   Pt was found to be increasingly confused and weak by family. They became concerned and called EMS for transport to [facility]. She had a recent visit to her PCP (10/22/19) with concerns about diarrhea and "not feeling well." At the time, supportive care was recommended. There has been no report of diarrhea as a concern for this visit.  ED Course:Lab work revealed AKI and acidemia. CTH was negative for acute process. TRH was called for admission.  Hospital Course:  Active Problems:   Schizoaffective disorder, bipolar type ([name])   ARF (acute renal failure) (HCC)   Pressure injury of skin   C. difficile colitis: -C. difficile antigen positive, toxin negative, toxigenic PCR negative, however she has foul smelling watery diarrhea and  left lower quadrant pain -CT abdomenon 10/14showed mild wall thickening involving descending colon and sigmoid colon, -started oral vancomycin on 10/14, add on flagyl now that k has normalized, continue to have severe watery diarrhea with abdominal pain, oral vancomycin doseincreased on 10/16 -Repeat kub due to persistent ab pain no acute findings -Diarrhea has much improved, stool becomes soft  October 17 pm, she adamantly want to go home and October 17 -[name] discharged home with oral vancomycin, close follow-up with  PCP  Hypokalemia/hypophosphotemia, replaced, normalized , follow-up with PCP  Hypomagnesemia, report h/o mag allergy, follow-up with PCP  AKI on CKDIIIb/metabolic acidosis, Creatinine on presentation 4.1,baseline creatinine 1.5-1.8: CK slightly elevated at 547. Urine culture no growth Renal ultrasound no obstructive nephropathy Appreciate nephrology input, She received IV fluids, bicarb Drip  Creatinine  now back to baseline avoid NSAID medication ACE or ARB.   Acute encephalopathy likely metabolic/uremic: CT headNo acute finding. LFTs and ammonia are stable.UA no evidence of UTI, Chest x-ray hypoinflation with mild hazy left midlung opacity may relate atelectasis or pneumonia but patient without fever or leukocytosis,  UDSpositive for benzo.Tylenol level salicylate level undetectable.  COVID-19 screen negative TSH unremarkable All sedative medication held on admission and entire hospitalization, did not resume Valium  at the discharge Now fully awake, report back to baseline   cystic structure within the left adnexa: -Incidental finding on renal ultrasound showed 8 cm cystic mass anterior to bladder  -CT with IV contrast ordered when okay with nephrology which showed "Corresponding to the ultrasound abnormality is a 7.7 cm cystic structure within the left adnexa. On the postcontrast images there are no internal areas of enhancing septation or mural nodule. Based on the CT appearance findings are low risk. Recommend referral to gynecology for further management." -ca125 unremarkable -Follow-up with GYN   Hypertension:BP stable. [name] on prazosin twice daily at home which is continued,  Hold Norvasc and metolazone, follow-up with PCP  Prediabeteswith hemoglobin A1c 5.6, CBG stable  Neuropathyon Neurontin , Neurontin held due to AKI , renal function improving,  resume Neurontin   Gout-stable  hold colchicine due to AKI, resumed at  discharge  COPD/chronic respiratory failure with hypoxia/hypercapnia/OSA:[facility], on 2 L nasal cannula baseline, also has been prescribed a trilogy ventilator usee every night.Cont same, cont nebs prn.  H/o hypoxic brain injury h/o schizoaffective; she demonstrates paranoid behavior with intermittent hallucination and aggression  psychiatry consulted, input appreciated,detail please refer to psychiatry consult note on October 14 Follow-up with psychiatry  Stage II pressure ulcer presents on admission Pressure Injury 10/27/19 Buttocks Right Stage 2 -  Partial thickness loss of dermis presenting as a shallow open injury with a red, pink wound bed without slough. (Active)  10/27/19 2101  Location: Buttocks  Location Orientation: Right  Staging: Stage 2 -  Partial thickness loss of dermis presenting as a shallow open injury with a red, pink wound bed without slough.  Wound Description (Comments):   Present on Admission: Yes    FTT : Pt eval Skilled nursing facility, however she declined, home health arranged  D/C date 11/02/19  1. F/u with PCP within a week  for hospital discharge follow up, repeat cbc/bmp at follow up 2. Follow-up with psychiatry 3. Follow-up with GYN 4. Home health and home equipment arranged, she declined skilled nursing facility placement  Since she has been home, states that she still does not have a home health aide worker, states that B order was not placed by the hospital.  Reports that she does not check blood pressure at home, does not have a blood pressure cuff.  Reports that she continues to take the prazosin twice a day, amlodipine was stopped in the hospital.  Reports that she has been having bilateral lower leg edema, abdominal swelling.  Reports she feels it is worsening since leaving the hospital.  Does not have compression stockings, states she tries to keep her feet elevated when possible.  Reports that her diarrhea has  mostly resolved, did have one episode this morning, describes it as a light brown color.  Reports that she has had solid bowel movements since she has been home from the hospital.  Does endorse that she continues to take probiotics on a daily basis.  Reports that she has had follow-up with psychiatry, is making an appointment to follow-up with pulmonology and cardiology.    Past Medical History:  Diagnosis Date  . Agitation 11/22/2017  . Anoxic brain injury ([place]) 09/08/2016   C. Arrest due to respiratory failure and COPD exacerbation  . Anxiety   . Arthritis    "all over" (04/10/2016)  . Asthma 10/18/2010  . Binge eating disorder   . Cardiac arrest ([place]) 09/08/2016   PEA  . Carotid artery stenosis    1-39% bilateral by dopplers 11/2016  . Chronic diastolic (congestive) heart failure ([place])   . Chronic kidney disease    "I see a kidney dr." (04/10/2016)  . Chronic pain syndrome 06/18/2012  . Chronic post-traumatic stress disorder (PTSD) 05/27/2018  . Chronic respiratory failure with hypoxia and hypercapnia (HCC) 06/22/2015   TRILOGY Vent >AVAPA-ES., Vt target 200-400, Max P 30 , PS max 20 , PS min 6-10 , E Max 6, E Min 4, Rate Auto AVAPS Rate 2 (titrate for pt comfort) , bleed O2 at 5l/Tiffany continuous flow .   [name] CKD (chronic kidney disease) stage 3, GFR 30-59 ml/min (HCC) 12/15/2016  . Closed displaced fracture of fifth metacarpal bone 03/21/2018  . Cocaine use disorder, severe, in sustained remission ([place]) 12/17/2015  . Complication of anesthesia    decreased bp, decreased heart rate  . COPD (chronic obstructive pulmonary  disease) ([place]) 07/08/2014  . Depression   . Diabetic neuropathy ([place]) 04/24/2011  . Difficulty with speech 01/24/2018  . Disorder of nervous system   . Drug abuse ([place]) 11/21/2017  . Dyslipidemia 04/24/2011  . Elevated troponin 04/28/2012  . Emphysema   . Encephalopathy 11/21/2017  . Essential hypertension 03/22/2016  . Fibula fracture 07/10/2016  . Frequent falls 10/11/2017  . GERD  (gastroesophageal reflux disease)   . Gout 04/11/2017  . Heart attack ([place]) 1980s  . History of blood transfusion 1994   "couldn't stop bleeding from my period"  . History of drug abuse in remission ([place]) 11/28/2015   Quit in 2017  . Hyperlipidemia LDL goal <70   . Incontinence   . Manic depression ([place])   . Morbid obesity ([place]) 10/18/2010  . Obstructive sleep apnea 10/18/2010  . On home oxygen therapy    "6L; 24/7" (04/10/2016)  . OSA on CPAP    "wear mask sometimes" (04/10/2016)  . Paranoid ([place])    "sometimes; I'Tiffany on RX for it" (04/10/2016)  . Prolonged Q-T interval on ECG   . Rectal bleeding 12/31/2015  . Right carotid bruit 11/09/2016  . Schizoaffective disorder, bipolar type ([place]) 04/05/2018  . Seasonal allergies   . Seborrheic keratoses 12/31/2013  . Seizures ([place])    "don't know what kind; last one was ~ 1 yr ago" (04/10/2016)  . Stroke [facility]) 1980s   denies residual on 04/10/2016  . Thrush 09/19/2013  . Type 2 diabetes mellitus ([place]) 10/18/2010    Past Surgical History:  Procedure Laterality Date  . CESAREAN SECTION  1997  . HERNIA REPAIR    . IR CHOLANGIOGRAM EXISTING TUBE  07/20/2016  . IR PERC CHOLECYSTOSTOMY  05/10/2016  . IR RADIOLOGIST EVAL & MGMT  06/08/2016  . IR RADIOLOGIST EVAL & MGMT  06/29/2016  . IR SINUS/FIST TUBE CHK-NON GI  07/12/2016  . RIGHT/LEFT HEART CATH AND CORONARY ANGIOGRAPHY N/A 06/19/2017   Procedure: RIGHT/LEFT HEART CATH AND CORONARY ANGIOGRAPHY;  Surgeon: Jolaine Artist, MD;  Location: [facility];  Service: Cardiovascular;  Laterality: N/A;  . TIBIA IM NAIL INSERTION Right 07/12/2016   Procedure: INTRAMEDULLARY (IM) NAIL RIGHT TIBIA;  Surgeon: Leandrew Koyanagi, MD;  Location: [facility];  Service: Orthopedics;  Laterality: Right;  . UMBILICAL HERNIA REPAIR  ~ 1963   "that's why I don't have a belly button"  . VAGINAL HYSTERECTOMY      Family History  Problem Relation Age of Onset  . Cancer Father        prostate  . Cancer Mother        lung   . Depression Mother   . Depression Sister   . Anxiety disorder Sister   . Schizophrenia Sister   . Bipolar disorder Sister   . Depression Sister   . Depression Brother   . Heart failure Other        cousin    Social History   Socioeconomic History  . Marital status: Widowed    Spouse name: Not on file  . Number of children: 3  . Years of education: Not on file  . Highest education level: Not on file  Occupational History  . Occupation: disabled    Comment: factory production  Tobacco Use  . Smoking status: Former Smoker    Packs/day: 1.50    Years: 38.00    Pack years: 57.00    Types: Cigarettes    Start date: 03/13/1977    Quit date: 04/10/2016  Years since quitting: 3.6  . Smokeless tobacco: Never Used  Vaping Use  . Vaping Use: Never used  Substance and Sexual Activity  . Alcohol use: No    Alcohol/week: 0.0 standard drinks  . Drug use: No    Types: Cocaine    Comment: 04/10/2016 "last used cocaine back in November 2017"  . Sexual activity: Not Currently    Birth control/protection: Surgical  Other Topics Concern  . Not on file  Social History Narrative   Has 1 son, [name]   Lives with son and his boyfriend   Her house has ramps and handrails should she ever needs them.    Her mother lives down the street from her and is a good support person in addition to her son.   She drives herself, has private transportation.    Cocaine free since 02/24/16, smoke free since 04/10/16   Social Determinants of Health   Financial Resource Strain:   . Difficulty of Paying Living Expenses: Not on file  Food Insecurity:   . Worried About Charity fundraiser in the Last Year: Not on file  . Ran Out of Food in the Last Year: Not on file  Transportation Needs:   . Lack of Transportation (Medical): Not on file  . Lack of Transportation (Non-Medical): Not on file  Physical Activity:   . Days of Exercise per Week: Not on file  . Minutes of Exercise per Session: Not on file   Stress:   . Feeling of Stress : Not on file  Social Connections:   . Frequency of Communication with Friends and Family: Not on file  . Frequency of Social Gatherings with Friends and Family: Not on file  . Attends Religious Services: Not on file  . Active Member of Clubs or Organizations: Not on file  . Attends Archivist Meetings: Not on file  . Marital Status: Not on file  Intimate Partner Violence:   . Fear of Current or Ex-Partner: Not on file  . Emotionally Abused: Not on file  . Physically Abused: Not on file  . Sexually Abused: Not on file    Outpatient Medications Prior to Visit  Medication Sig Dispense Refill  . albuterol (PROVENTIL) (2.5 MG/3ML) 0.083% nebulizer solution Take 3 mLs (2.5 mg total) by nebulization every 6 (six) hours as needed for wheezing or shortness of breath. 75 mL 5  . Asenapine Maleate 10 MG SUBL Place 1 tablet (10 mg total) under the tongue in the morning and at bedtime. 60 tablet 2  . atorvastatin (LIPITOR) 80 MG tablet Take 1 tablet (80 mg total) by mouth daily. 90 tablet 0  . colchicine 0.6 MG tablet Take 1 tablet (0.6 mg total) by mouth 2 (two) times daily. 180 tablet 0  . diazepam (VALIUM) 5 MG tablet Take 1 tablet (5 mg total) by mouth every 8 (eight) hours as needed for anxiety. 90 tablet 1  . diclofenac (VOLTAREN) 75 MG EC tablet Take 1 tablet (75 mg total) by mouth 2 (two) times daily. 90 tablet 0  . diclofenac Sodium (VOLTAREN) 1 % GEL Apply 2 g topically 4 (four) times daily. 350 g 0  . febuxostat (ULORIC) 40 MG tablet Take 1 tablet (40 mg total) by mouth daily. 90 tablet 0  . ferrous sulfate 325 (65 FE) MG tablet Take 1 tablet (325 mg total) by mouth daily with breakfast. 180 tablet 0  . gabapentin (NEURONTIN) 600 MG tablet Take 1 tablet (600 mg total) by mouth  2 (two) times daily. 180 tablet 1  . lisdexamfetamine (VYVANSE) 70 MG capsule Take 1 capsule (70 mg total) by mouth daily before breakfast. 30 capsule 0  . montelukast  (SINGULAIR) 10 MG tablet Take 10 mg by mouth at bedtime.    . Multiple Vitamin (MULTIVITAMIN WITH MINERALS) TABS tablet Take 1 tablet by mouth daily.    . potassium chloride SA (KLOR-CON) 20 MEQ tablet Take 2 tablets (40 mEq total) by mouth daily. 360 tablet 1  . prazosin (MINIPRESS) 5 MG capsule Take 1 capsule (5 mg total) by mouth 2 (two) times daily. 60 capsule 2  . saccharomyces boulardii (FLORASTOR) 250 MG capsule Take 1 capsule (250 mg total) by mouth 2 (two) times daily. 20 capsule 0  . traZODone (DESYREL) 100 MG tablet Take 1 tablet (100 mg total) by mouth at bedtime as needed for sleep. 30 tablet 2  . albuterol (VENTOLIN HFA) 108 (90 Base) MCG/ACT inhaler Inhale 2 puffs into the lungs every 6 (six) hours as needed for wheezing or shortness of breath. 18 g 2  . Fluticasone-Umeclidin-Vilant (TRELEGY ELLIPTA) 100-62.5-25 MCG/INH AEPB Inhale 1 puff into the lungs daily. 60 each 3  . oxybutynin (DITROPAN-XL) 10 MG 24 hr tablet Take 1 tablet (10 mg total) by mouth at bedtime. 90 tablet 0   No facility-administered medications prior to visit.    Allergies  Allergen Reactions  . Hydrocodone Shortness Of Breath  . Hydrocodone-Acetaminophen Shortness Of Breath  . Hydroxyzine Anaphylaxis and Shortness Of Breath  . Latuda [Lurasidone Hcl] Anaphylaxis  . Lurasidone Anaphylaxis  . Magnesium-Containing Compounds Anaphylaxis  . Prednisone Anaphylaxis, Swelling and Other (See Comments)    Tongue swelling  . Tramadol Anaphylaxis and Swelling  . Codeine Nausea And Vomiting  . Other Rash  . Sulfa Antibiotics Itching  . Tape Rash    ROS Review of Systems  Constitutional: Negative for chills, fatigue and fever.  HENT: Negative.   Eyes: Negative.   Respiratory: Negative.   Cardiovascular: Positive for leg swelling. Negative for chest pain and palpitations.  Gastrointestinal: Positive for diarrhea. Negative for abdominal pain.  Endocrine: Negative.   Genitourinary: Negative.    Musculoskeletal: Positive for back pain.  Skin: Negative.   Allergic/Immunologic: Negative.   Neurological: Negative for dizziness, speech difficulty and headaches.  Hematological: Negative.   Psychiatric/Behavioral: Negative.       Objective:    Physical Exam Vitals and nursing note reviewed.  Constitutional:      General: [name] not in acute distress.    Appearance: Normal appearance. [name] not ill-appearing.  HENT:     Head: Normocephalic and atraumatic.     Right Ear: External ear normal.     Left Ear: External ear normal.     Nose: Nose normal.     Mouth/Throat:     Mouth: Mucous membranes are moist.     Pharynx: Oropharynx is clear.  Eyes:     Extraocular Movements: Extraocular movements intact.     Conjunctiva/sclera: Conjunctivae normal.     Pupils: Pupils are equal, round, and reactive to light.  Cardiovascular:     Rate and Rhythm: Normal rate and regular rhythm.     Pulses: Normal pulses.          Dorsalis pedis pulses are 2+ on the right side and 2+ on the left side.     Heart sounds: Normal heart sounds.  Pulmonary:     Effort: Pulmonary effort is normal.     Breath sounds: Normal  breath sounds.  Abdominal:     General: Abdomen is flat. There is no distension.     Palpations: Abdomen is soft.  Musculoskeletal:     Cervical back: Normal range of motion and neck supple.     Right lower leg: 1+ Edema present.     Left lower leg: 1+ Edema present.     Comments: In wheelchair  Skin:    General: Skin is warm and dry.  Neurological:     General: No focal deficit present.     Mental Status: [name] alert and oriented to person, place, and time.  Psychiatric:        Mood and Affect: Mood normal.        Behavior: Behavior normal.        Thought Content: Thought content normal.        Judgment: Judgment normal.     BP (!) 178/98 (BP Location: Left Arm, Patient Position: Sitting, Cuff Size: Normal)   Pulse 79   Temp 98.2 F (36.8 C) (Oral)   Resp 18    SpO2 95%  Wt Readings from Last 3 Encounters:  10/30/19 157 lb 10.1 oz (71.5 kg)  10/09/19 152 lb 4.8 oz (69.1 kg)  10/02/19 152 lb 1.9 oz (69 kg)     Health Maintenance Due  Topic Date Due  . COVID-19 Vaccine (1) Never done  . FOOT EXAM  03/12/2018  . OPHTHALMOLOGY EXAM  04/20/2018  . COLON CANCER SCREENING ANNUAL FOBT  12/26/2018  . HEMOGLOBIN A1C  05/09/2019  . MAMMOGRAM  06/30/2019  . INFLUENZA VACCINE  08/17/2019    There are no preventive care reminders to display for this patient.  Lab Results  Component Value Date   TSH 1.233 10/27/2019   Lab Results  Component Value Date   WBC 5.0 11/25/2019   HGB 9.0 (L) 11/25/2019   HCT 28.2 (L) 11/25/2019   MCV 90 11/25/2019   PLT 375 11/25/2019   Lab Results  Component Value Date   NA 142 11/25/2019   K 3.5 11/25/2019   CO2 23 11/02/2019   GLUCOSE 75 11/25/2019   BUN 13 11/25/2019   CREATININE 1.22 (H) 11/25/2019   BILITOT <0.2 11/25/2019   ALKPHOS 123 (H) 11/25/2019   AST 13 11/25/2019   ALT 31 10/29/2019   PROT 5.7 (L) 11/25/2019   ALBUMIN 3.6 (L) 11/25/2019   CALCIUM 8.6 (L) 11/25/2019   ANIONGAP 7 11/02/2019   Lab Results  Component Value Date   CHOL 127 08/05/2018   Lab Results  Component Value Date   HDL 62 08/05/2018   Lab Results  Component Value Date   LDLCALC 54 08/05/2018   Lab Results  Component Value Date   TRIG 53 08/05/2018   Lab Results  Component Value Date   CHOLHDL 2.0 08/05/2018   Lab Results  Component Value Date   HGBA1C 5.6 11/08/2018      Assessment & Plan:   Problem List Items Addressed This Visit      Cardiovascular and Mediastinum   Essential hypertension (Chronic)   Relevant Orders   CBC with Differential/Platelet (Completed)   Comp. Metabolic Panel (12) (Completed)     Respiratory   COPD (chronic obstructive pulmonary disease) (HCC) (Chronic)   Relevant Medications   Fluticasone-Umeclidin-Vilant (TRELEGY ELLIPTA) 100-62.5-25 MCG/INH AEPB   albuterol  (VENTOLIN HFA) 108 (90 Base) MCG/ACT inhaler     Genitourinary   Overactive bladder   Relevant Medications   oxybutynin (DITROPAN-XL) 10 MG 24  hr tablet    Other Visit Diagnoses    Diarrhea, unspecified type    -  Primary   Hypomagnesemia       Relevant Orders   Magnesium (Completed)   Hypophosphatemia       Relevant Orders   Phosphorus (Completed)   Adnexal mass       Relevant Orders   Ambulatory referral to Obstetrics / Gynecology   Edema of both lower legs         1. Diarrhea, unspecified type Mostly resolved, continue daily probiotics  2. Essential hypertension BP elevated in office, amlodipine d/c in the hospital, avoid ACE or ARB due to recent AKI on CKD IIIb/metabolic acidosis BP was stable in hospital, patient to obtain cuff, check BP at home, report any elevated readings   - CBC with Differential/Platelet - Comp. Metabolic Panel (12)  3. Chronic bronchitis, unspecified chronic bronchitis type ([place]) Refill per patient request, patient is making appointment to follow-up with pulmonology - Fluticasone-Umeclidin-Vilant (TRELEGY ELLIPTA) 100-62.5-25 MCG/INH AEPB; Inhale 1 puff into the lungs daily.  Dispense: 60 each; Refill: 3 - albuterol (VENTOLIN HFA) 108 (90 Base) MCG/ACT inhaler; Inhale 2 puffs into the lungs every 6 (six) hours as needed for wheezing or shortness of breath.  Dispense: 18 g; Refill: 2  4. Hypomagnesemia Patient repots allergy to magnesium; monitor - Magnesium  5. Hypophosphatemia  - Phosphorus  6. Overactive bladder Continue current medication - oxybutynin (DITROPAN-XL) 10 MG 24 hr tablet; Take 1 tablet (10 mg total) by mouth at bedtime.  Dispense: 90 tablet; Refill: 0  7. Adnexal mass  - Ambulatory referral to Obstetrics / Gynecology  8. Lower Leg Edema bilateral Patient education given, compression stockings, low-sodium diet, keep feet elevated when possible  Patient was given appointment to establish care at [facility],, closer to her  home.  We will work to get patient a BP cuff, compression stockings and home health care.   I have reviewed the patient's medical history (PMH, PSH, Social History, Family History, Medications, and allergies) , and have been updated if relevant. I spent 30 minutes reviewing chart and  face to face time with patient.     Meds ordered this encounter  Medications  . oxybutynin (DITROPAN-XL) 10 MG 24 hr tablet    Sig: Take 1 tablet (10 mg total) by mouth at bedtime.    Dispense:  90 tablet    Refill:  0    Order Specific Question:   Supervising Provider    Answer:   Joya Gaskins, PATRICK E [[id number]]  . Fluticasone-Umeclidin-Vilant (TRELEGY ELLIPTA) 100-62.5-25 MCG/INH AEPB    Sig: Inhale 1 puff into the lungs daily.    Dispense:  60 each    Refill:  3    Order Specific Question:   Supervising Provider    Answer:   Noralyn Pick    Order Specific Question:   Lot Number?    Answer:   [id number]    Order Specific Question:   Expiration Date?    Answer:   11/12/2018    Order Specific Question:   Manufacturer?    Answer:   [employer] [[id number]]    Order Specific Question:   Quantity    Answer:   1  . albuterol (VENTOLIN HFA) 108 (90 Base) MCG/ACT inhaler    Sig: Inhale 2 puffs into the lungs every 6 (six) hours as needed for wheezing or shortness of breath.    Dispense:  18 g    Refill:  2  Insurance preference for albuterol    Order Specific Question:   Supervising Provider    Answer:   Elsie Stain [[id number]]    Follow-up: No follow-ups on file.    Loraine Grip Mayers, PA-C

## 2019-11-25 NOTE — Patient Instructions (Signed)
I am going to work on making sure that you have home health care, work on getting you a blood pressure cuff and compression stockings.  I started the referral for your to be seen at gynecology.  We will call you with your lab results.  Please make sure to follow up with your lung and your heart dr.  Kennieth Rad, PA-C Physician Assistant [place] [email or web address]    Blood Pressure Record Sheet To take your blood pressure, you will need a blood pressure machine. You can buy a blood pressure machine (blood pressure monitor) at your clinic, drug store, or online. When choosing one, consider:  An automatic monitor that has an arm cuff.  A cuff that wraps snugly around your upper arm. You should be able to fit only one finger between your arm and the cuff.  A device that stores blood pressure reading results.  Do not choose a monitor that measures your blood pressure from your wrist or finger. Follow your health care provider's instructions for how to take your blood pressure. To use this form:  Get one reading in the morning (a.m.) before you take any medicines.  Get one reading in the evening (p.m.) before supper.  Take at least 2 readings with each blood pressure check. This makes sure the results are correct. Wait 1-2 minutes between measurements.  Write down the results in the spaces on this form.  Repeat this once a week, or as told by your health care provider.  Make a follow-up appointment with your health care provider to discuss the results. Blood pressure log Date: _______________________  a.m. _____________________(1st reading) _____________________(2nd reading)  p.m. _____________________(1st reading) _____________________(2nd reading) Date: _______________________  a.m. _____________________(1st reading) _____________________(2nd reading)  p.m. _____________________(1st reading)  _____________________(2nd reading) Date: _______________________  a.m. _____________________(1st reading) _____________________(2nd reading)  p.m. _____________________(1st reading) _____________________(2nd reading) Date: _______________________  a.m. _____________________(1st reading) _____________________(2nd reading)  p.m. _____________________(1st reading) _____________________(2nd reading) Date: _______________________  a.m. _____________________(1st reading) _____________________(2nd reading)  p.m. _____________________(1st reading) _____________________(2nd reading) This information is not intended to replace advice given to you by your health care provider. Make sure you discuss any questions you have with your health care provider. Document Revised: 03/02/2017 Document Reviewed: 01/02/2017 Elsevier Patient Education  [employer].

## 2019-11-25 NOTE — Progress Notes (Signed)
Patient presents for HFU requesting a referral to [facility] for [facility] to send and aid to help her with ADL. Patient needs a referral to Genecology to address a cyst that was noted on a scan, Patient complains of urination burning the past 2 weeks with a foul smell.

## 2019-11-26 ENCOUNTER — Telehealth: Payer: Self-pay | Admitting: *Deleted

## 2019-11-26 ENCOUNTER — Ambulatory Visit: Payer: Medicare Other | Admitting: Physician Assistant

## 2019-11-26 ENCOUNTER — Other Ambulatory Visit: Payer: Self-pay | Admitting: Physician Assistant

## 2019-11-26 DIAGNOSIS — I5032 Chronic diastolic (congestive) heart failure: Secondary | ICD-10-CM

## 2019-11-26 DIAGNOSIS — E1142 Type 2 diabetes mellitus with diabetic polyneuropathy: Secondary | ICD-10-CM

## 2019-11-26 DIAGNOSIS — J42 Unspecified chronic bronchitis: Secondary | ICD-10-CM

## 2019-11-26 DIAGNOSIS — D649 Anemia, unspecified: Secondary | ICD-10-CM

## 2019-11-26 DIAGNOSIS — G894 Chronic pain syndrome: Secondary | ICD-10-CM

## 2019-11-26 LAB — COMP. METABOLIC PANEL (12)
AST: 13 IU/L (ref 0–40)
Albumin/Globulin Ratio: 1.7 (ref 1.2–2.2)
Albumin: 3.6 g/dL — ABNORMAL LOW (ref 3.8–4.9)
Alkaline Phosphatase: 123 IU/L — ABNORMAL HIGH (ref 44–121)
BUN/Creatinine Ratio: 11 — ABNORMAL LOW (ref 12–28)
BUN: 13 mg/dL (ref 8–27)
Bilirubin Total: <0.2 mg/dL (ref 0.0–1.2)
Calcium: 8.6 mg/dL — ABNORMAL LOW (ref 8.7–10.3)
Chloride: 111 mmol/L — ABNORMAL HIGH (ref 96–106)
Creatinine, Ser: 1.22 mg/dL — ABNORMAL HIGH (ref 0.57–1.00)
GFR calc Af Amer: 56 mL/min/{1.73_m2} — ABNORMAL LOW (ref >59)
GFR calc non Af Amer: 48 mL/min/{1.73_m2} — ABNORMAL LOW (ref >59)
Globulin, Total: 2.1 g/dL (ref 1.5–4.5)
Glucose: 75 mg/dL (ref 65–99)
Potassium: 3.5 mmol/L (ref 3.5–5.2)
Sodium: 142 mmol/L (ref 134–144)
Total Protein: 5.7 g/dL — ABNORMAL LOW (ref 6.0–8.5)

## 2019-11-26 LAB — CBC WITH DIFFERENTIAL/PLATELET
Basophils Absolute: 0 10*3/uL (ref 0.0–0.2)
Basos: 0 %
EOS (ABSOLUTE): 0.2 10*3/uL (ref 0.0–0.4)
Eos: 3 %
Hematocrit: 28.2 % — ABNORMAL LOW (ref 34.0–46.6)
Hemoglobin: 9 g/dL — ABNORMAL LOW (ref 11.1–15.9)
Immature Grans (Abs): 0 10*3/uL (ref 0.0–0.1)
Immature Granulocytes: 0 %
Lymphocytes Absolute: 2 10*3/uL (ref 0.7–3.1)
Lymphs: 40 %
MCH: 28.8 pg (ref 26.6–33.0)
MCHC: 31.9 g/dL (ref 31.5–35.7)
MCV: 90 fL (ref 79–97)
Monocytes Absolute: 0.5 10*3/uL (ref 0.1–0.9)
Monocytes: 9 %
Neutrophils Absolute: 2.4 10*3/uL (ref 1.4–7.0)
Neutrophils: 48 %
Platelets: 375 10*3/uL (ref 150–450)
RBC: 3.13 x10E6/uL — ABNORMAL LOW (ref 3.77–5.28)
RDW: 15.4 % (ref 11.7–15.4)
WBC: 5 10*3/uL (ref 3.4–10.8)

## 2019-11-26 LAB — PHOSPHORUS: Phosphorus: 3.5 mg/dL (ref 3.0–4.3)

## 2019-11-26 LAB — MAGNESIUM: Magnesium: 1.3 mg/dL — ABNORMAL LOW (ref 1.6–2.3)

## 2019-11-26 MED ORDER — BLOOD PRESSURE CUFF MISC: 1.0000 [IU] | Freq: Every day | 0 refills | Status: DC

## 2019-11-26 MED ORDER — MEDICAL COMPRESSION STOCKINGS MISC: 1.0000 [IU] | Freq: Every day | 1 refills | Status: DC

## 2019-11-26 NOTE — Telephone Encounter (Signed)
Patient verified DOB Patient is aware of hemoglobin worsening and needing an iron panel check. Patient is aware of other levels improving and needing to monitor magnesium level as her diarrhea resolves. Patient is aware of receiving a phone call on Monday to confirm if the [facility] team is located at [facility] so she may return for her further blood work. Patient also aware of staff working on her compression stockings, BP cuff and HH referral.

## 2019-11-28 ENCOUNTER — Telehealth: Payer: Self-pay | Admitting: Emergency Medicine

## 2019-11-28 DIAGNOSIS — J42 Unspecified chronic bronchitis: Secondary | ICD-10-CM

## 2019-11-28 MED ORDER — MONTELUKAST SODIUM 10 MG PO TABS
10.0000 mg | ORAL_TABLET | Freq: Every day | ORAL | 11 refills | Status: DC
Start: 2019-11-28 — End: 2020-05-31

## 2019-11-28 MED ORDER — TRELEGY ELLIPTA 100-62.5-25 MCG/INH IN AEPB: 1.0000 | INHALATION_SPRAY | Freq: Every day | RESPIRATORY_TRACT | 11 refills | Status: DC

## 2019-11-28 NOTE — Telephone Encounter (Signed)
LMTCB for the pt- need to give recs from MW and get her into see SG next wk

## 2019-11-28 NOTE — Telephone Encounter (Signed)
Spoke with the pt  She states having increased SOB x 3 wks  She gets winded walking room to room at home  [name] having some minimal wheezing but denies any cough  She states her nose is also runny  She denies any f/c/s or body aches  She states that she is using her albuterol inhaler 4 x per day and has not used neb  [name] taking her singulair and trelegy daily  Please advise recs, thanks!

## 2019-11-28 NOTE — Telephone Encounter (Signed)
Spoke with the pt  She states wants to switch DME's bc her and [employer] "not getting along" I do not see any recent qualifying o2 numbers  Advised will need ov if she really wants to switch bc DME will need qualifying numbers  She will call back for appt for this purpose after she is feeling better from current possible flare

## 2019-11-28 NOTE — Telephone Encounter (Signed)
LMTCB for pt 
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
## 2019-11-28 NOTE — Telephone Encounter (Signed)
Try supplementing trelegy with the neb up to q 4 x daily over the weekend and make appt first avail next week to sort out  If can't bet comfortable at rest after the neb will need to go to ER

## 2019-12-04 ENCOUNTER — Telehealth: Payer: Self-pay | Admitting: Emergency Medicine

## 2019-12-04 NOTE — Telephone Encounter (Signed)
Spoke with the pt  She states that she "has issues with [employer]" They told her that "some of her equipment amy have been recalled" She states that she currently has all of the equipment that she needs  It is not clear what the issue is  I called [employer] and spoke with Jonelle Sidle  Was advised that they have been trying to reach the pt since they last seen her in April 2021  She has their trilogy vent and POC and not paying for this  They have deactivated her after several attempts by telephone and coming to her home  Pt needs to call them per [place] the pt and LMTCB

## 2019-12-04 NOTE — Telephone Encounter (Signed)
Patient is returning phone call. Patient phone number is [phone].

## 2019-12-05 ENCOUNTER — Ambulatory Visit: Payer: Medicare Other | Admitting: Primary Care

## 2019-12-05 NOTE — Telephone Encounter (Signed)
Spoke with patient. She stated that she has switched insurance companies and wonders if this is why she has a Engineer, technical sales with [employer]. I advised her to call [employer] herself to provide them with the new insurance information as we will can not and will not do this. Provided her with the number to [place].    Nothing further needed at time of call.

## 2019-12-05 NOTE — Telephone Encounter (Signed)
ATC, left patient a vm letting her know that our office/Dr. Byrum cannot resolve billing issue with [employer] and she will need to contact [facility] [place] directly.

## 2019-12-05 NOTE — Telephone Encounter (Signed)
Pt is returning call from office . She states no message was left . Please advise

## 2019-12-06 NOTE — BH Specialist Note (Signed)
LCSW met with patient. Patient reported feelings of frustration with the quality of care received from PCP. Stated that she is not happy with continued virtual appointments and is upset that numerous referrals have yet to be placed (GYN, [place], etc) Pt reports difficulty managing chronic health conditions increasing anxiety.   LCSW provided support and encouragement. Although pt had an upcoming appt with PCP the following day, LCSW consulted with PA-C [place], who agreed to complete an in-person appointment for same day. Pt was appreciative.

## 2019-12-07 ENCOUNTER — Other Ambulatory Visit: Payer: Medicare Other

## 2019-12-08 ENCOUNTER — Telehealth: Payer: Self-pay | Admitting: Internal Medicine

## 2019-12-08 ENCOUNTER — Ambulatory Visit: Payer: Medicare Other | Admitting: Family

## 2019-12-08 NOTE — Telephone Encounter (Signed)
Returning a call to the patient because she has been trying to get in touch with [facility]. Patient has been working with [place] [phone]) and Medicare and Medicaid to get her home health care hours increased. Patient states she needs a medical change request to up her hours to 35 hours/week. Patient is requesting for the medical change request to be faxed to medicare [phone]. Please follow up/advise.

## 2019-12-09 ENCOUNTER — Ambulatory Visit: Payer: Medicare Other | Admitting: Emergency Medicine

## 2019-12-10 ENCOUNTER — Other Ambulatory Visit: Payer: Self-pay

## 2019-12-10 ENCOUNTER — Telehealth (INDEPENDENT_AMBULATORY_CARE_PROVIDER_SITE_OTHER): Payer: Medicare Other | Admitting: Psychiatry

## 2019-12-10 DIAGNOSIS — F4310 Post-traumatic stress disorder, unspecified: Secondary | ICD-10-CM

## 2019-12-10 DIAGNOSIS — F5081 Binge eating disorder: Secondary | ICD-10-CM | POA: Diagnosis not present

## 2019-12-10 DIAGNOSIS — F250 Schizoaffective disorder, bipolar type: Secondary | ICD-10-CM

## 2019-12-10 DIAGNOSIS — E1142 Type 2 diabetes mellitus with diabetic polyneuropathy: Secondary | ICD-10-CM

## 2019-12-10 MED ORDER — LISDEXAMFETAMINE DIMESYLATE 70 MG PO CAPS
70.0000 mg | ORAL_CAPSULE | Freq: Every day | ORAL | 0 refills | Status: DC
Start: 2019-12-10 — End: 2020-02-04

## 2019-12-10 MED ORDER — GABAPENTIN 600 MG PO TABS: 600.0000 mg | ORAL_TABLET | Freq: Two times a day (BID) | ORAL | 1 refills | Status: DC

## 2019-12-10 MED ORDER — DIAZEPAM 10 MG PO TABS: 10.0000 mg | ORAL_TABLET | Freq: Three times a day (TID) | ORAL | 1 refills | Status: DC | PRN

## 2019-12-10 MED ORDER — TRAZODONE HCL 100 MG PO TABS: ORAL_TABLET | ORAL | 2 refills | Status: DC

## 2019-12-10 MED ORDER — LISDEXAMFETAMINE DIMESYLATE 70 MG PO CAPS
70.0000 mg | ORAL_CAPSULE | Freq: Every day | ORAL | 0 refills | Status: DC
Start: 2019-12-10 — End: 2019-12-10

## 2019-12-10 MED ORDER — ASENAPINE MALEATE 10 MG SL SUBL: 10.0000 mg | SUBLINGUAL_TABLET | Freq: Two times a day (BID) | SUBLINGUAL | 2 refills | Status: DC

## 2019-12-10 MED ORDER — PRAZOSIN HCL 5 MG PO CAPS: 5.0000 mg | ORAL_CAPSULE | Freq: Two times a day (BID) | ORAL | 2 refills | Status: DC

## 2019-12-10 NOTE — Telephone Encounter (Signed)
Pt calling stating that the Aid she has currently is not being helpful. She states that the aid is complaining and not helping her clean up. Pt is requesting to have orders sent to [facility] facility. Pt also states that she is having trouble receiving her medication and that she previously spoken with [facility]. Please advise.

## 2019-12-10 NOTE — Telephone Encounter (Signed)
Can you please forward the orders to [place] facility per patient request?   Phill Myron, D.O. Primary Care at [facility]  12/10/2019, 3:25 PM

## 2019-12-10 NOTE — Progress Notes (Signed)
[facility] MD/PA/NP OP Progress Note  12/10/2019 10:21 AM Tiffany [name]  MRN:  [id number] Interview was conducted by phone and I verified that I was speaking with the correct person using two identifiers. I discussed the limitations of evaluation and management by telemedicine and  the availability of in person appointments. Patient expressed understanding and agreed to proceed. Patient location - home; physician - home office.  Chief Complaint: Anxiety, easily frustrated/irritable.  HPI: 59yo divorced AAF with schizoaffective disorder bipolar type(vs paranoid schizophrenia),chronicPTSD,remote hx ofcocaineaddiction (clean for4years)and bingeeating disorder.She tried ziprazidone but developedQTC prolongation on itso it was changed toSaphriswhich she believes is a best medication she has ever been on. [name] also on escitalopram andwas onVyvanse for binge eating disorder. Tiffany [name] used to weigh over 300 lbs and since starting Vyvanse her binge eating is well controlled and she was able to lose a lot of weight). She continues to take diazepam 10 mgtidand trazodone for sleep (100 mg plus 50 mg in am "for nerves").She has a strained relationship with her family - mother in particular, but also son.  [name] not comfortable living with her mother but has an independent apartment as of this month. She denies feeling particularly depressed but remains anxious and feels that generic asenapine which she got instead of Saphris is less effecting in controling paranoia/irritability/anxiety. Shesound less paranoid though than during last few visits and reports beingcompliant with her medications.   Visit Diagnosis:    ICD-10-CM   1. Schizoaffective disorder, bipolar type ([place])  F25.0   2. Diabetic polyneuropathy associated with type 2 diabetes mellitus (HCC)  E11.42 gabapentin (NEURONTIN) 600 MG tablet  3. PTSD (post-traumatic stress disorder)  F43.10   4. Binge eating disorder  F50.81     Past  Psychiatric History: Please see intake H&P.  Past Medical History:  Past Medical History:  Diagnosis Date  . Agitation 11/22/2017  . Anoxic brain injury ([place]) 09/08/2016   C. Arrest due to respiratory failure and COPD exacerbation  . Anxiety   . Arthritis    "all over" (04/10/2016)  . Asthma 10/18/2010  . Binge eating disorder   . Cardiac arrest ([place]) 09/08/2016   PEA  . Carotid artery stenosis    1-39% bilateral by dopplers 11/2016  . Chronic diastolic (congestive) heart failure ([place])   . Chronic kidney disease    "I see a kidney dr." (04/10/2016)  . Chronic pain syndrome 06/18/2012  . Chronic post-traumatic stress disorder (PTSD) 05/27/2018  . Chronic respiratory failure with hypoxia and hypercapnia (HCC) 06/22/2015   TRILOGY Vent >AVAPA-ES., Vt target 200-400, Max P 30 , PS max 20 , PS min 6-10 , E Max 6, E Min 4, Rate Auto AVAPS Rate 2 (titrate for pt comfort) , bleed O2 at 5l/m continuous flow .   [name] CKD (chronic kidney disease) stage 3, GFR 30-59 ml/min (HCC) 12/15/2016  . Closed displaced fracture of fifth metacarpal bone 03/21/2018  . Cocaine use disorder, severe, in sustained remission ([place]) 12/17/2015  . Complication of anesthesia    decreased bp, decreased heart rate  . COPD (chronic obstructive pulmonary disease) ([place]) 07/08/2014  . Depression   . Diabetic neuropathy ([place]) 04/24/2011  . Difficulty with speech 01/24/2018  . Disorder of nervous system   . Drug abuse ([place]) 11/21/2017  . Dyslipidemia 04/24/2011  . Elevated troponin 04/28/2012  . Emphysema   . Encephalopathy 11/21/2017  . Essential hypertension 03/22/2016  . Fibula fracture 07/10/2016  . Frequent falls 10/11/2017  . GERD (gastroesophageal reflux  disease)   . Gout 04/11/2017  . Heart attack ([place]) 1980s  . History of blood transfusion 1994   "couldn't stop bleeding from my period"  . History of drug abuse in remission ([place]) 11/28/2015   Quit in 2017  . Hyperlipidemia LDL goal <70   . Incontinence   . Manic depression ([place])    . Morbid obesity ([place]) 10/18/2010  . Obstructive sleep apnea 10/18/2010  . On home oxygen therapy    "6L; 24/7" (04/10/2016)  . OSA on CPAP    "wear mask sometimes" (04/10/2016)  . Paranoid ([place])    "sometimes; I'm on RX for it" (04/10/2016)  . Prolonged Q-T interval on ECG   . Rectal bleeding 12/31/2015  . Right carotid bruit 11/09/2016  . Schizoaffective disorder, bipolar type ([place]) 04/05/2018  . Seasonal allergies   . Seborrheic keratoses 12/31/2013  . Seizures ([place])    "don't know what kind; last one was ~ 1 yr ago" (04/10/2016)  . Stroke [facility]) 1980s   denies residual on 04/10/2016  . Thrush 09/19/2013  . Type 2 diabetes mellitus ([place]) 10/18/2010    Past Surgical History:  Procedure Laterality Date  . CESAREAN SECTION  1997  . HERNIA REPAIR    . IR CHOLANGIOGRAM EXISTING TUBE  07/20/2016  . IR PERC CHOLECYSTOSTOMY  05/10/2016  . IR RADIOLOGIST EVAL & MGMT  06/08/2016  . IR RADIOLOGIST EVAL & MGMT  06/29/2016  . IR SINUS/FIST TUBE CHK-NON GI  07/12/2016  . RIGHT/LEFT HEART CATH AND CORONARY ANGIOGRAPHY N/A 06/19/2017   Procedure: RIGHT/LEFT HEART CATH AND CORONARY ANGIOGRAPHY;  Surgeon: Jolaine Artist, MD;  Location: [facility];  Service: Cardiovascular;  Laterality: N/A;  . TIBIA IM NAIL INSERTION Right 07/12/2016   Procedure: INTRAMEDULLARY (IM) NAIL RIGHT TIBIA;  Surgeon: Leandrew Koyanagi, MD;  Location: [facility];  Service: Orthopedics;  Laterality: Right;  . UMBILICAL HERNIA REPAIR  ~ 1963   "that's why I don't have a belly button"  . VAGINAL HYSTERECTOMY      Family Psychiatric History: Reviewed.  Family History:  Family History  Problem Relation Age of Onset  . Cancer Father        prostate  . Cancer Mother        lung  . Depression Mother   . Depression Sister   . Anxiety disorder Sister   . Schizophrenia Sister   . Bipolar disorder Sister   . Depression Sister   . Depression Brother   . Heart failure Other        cousin    Social History:  Social History    Socioeconomic History  . Marital status: Widowed    Spouse name: Not on file  . Number of children: 3  . Years of education: Not on file  . Highest education level: Not on file  Occupational History  . Occupation: disabled    Comment: factory production  Tobacco Use  . Smoking status: Former Smoker    Packs/day: 1.50    Years: 38.00    Pack years: 57.00    Types: Cigarettes    Start date: 03/13/1977    Quit date: 04/10/2016    Years since quitting: 3.6  . Smokeless tobacco: Never Used  Vaping Use  . Vaping Use: Never used  Substance and Sexual Activity  . Alcohol use: No    Alcohol/week: 0.0 standard drinks  . Drug use: No    Types: Cocaine    Comment: 04/10/2016 "last used cocaine back  in November 2017"  . Sexual activity: Not Currently    Birth control/protection: Surgical  Other Topics Concern  . Not on file  Social History Narrative   Has 1 son, [name]   Lives with son and his boyfriend   Her house has ramps and handrails should she ever needs them.    Her mother lives down the street from her and is a good support person in addition to her son.   She drives herself, has private transportation.    Cocaine free since 02/24/16, smoke free since 04/10/16   Social Determinants of Health   Financial Resource Strain:   . Difficulty of Paying Living Expenses: Not on file  Food Insecurity:   . Worried About Charity fundraiser in the Last Year: Not on file  . Ran Out of Food in the Last Year: Not on file  Transportation Needs:   . Lack of Transportation (Medical): Not on file  . Lack of Transportation (Non-Medical): Not on file  Physical Activity:   . Days of Exercise per Week: Not on file  . Minutes of Exercise per Session: Not on file  Stress:   . Feeling of Stress : Not on file  Social Connections:   . Frequency of Communication with Friends and Family: Not on file  . Frequency of Social Gatherings with Friends and Family: Not on file  . Attends Religious  Services: Not on file  . Active Member of Clubs or Organizations: Not on file  . Attends Archivist Meetings: Not on file  . Marital Status: Not on file    Allergies:  Allergies  Allergen Reactions  . Hydrocodone Shortness Of Breath  . Hydrocodone-Acetaminophen Shortness Of Breath  . Hydroxyzine Anaphylaxis and Shortness Of Breath  . Latuda [Lurasidone Hcl] Anaphylaxis  . Lurasidone Anaphylaxis  . Magnesium-Containing Compounds Anaphylaxis  . Prednisone Anaphylaxis, Swelling and Other (See Comments)    Tongue swelling  . Tramadol Anaphylaxis and Swelling  . Codeine Nausea And Vomiting  . Other Rash  . Sulfa Antibiotics Itching  . Tape Rash    Metabolic Disorder Labs: Lab Results  Component Value Date   HGBA1C 5.6 11/08/2018   MPG 125.5 01/07/2017   MPG 111.15 11/03/2016   No results found for: PROLACTIN Lab Results  Component Value Date   CHOL 127 08/05/2018   TRIG 53 08/05/2018   HDL 62 08/05/2018   CHOLHDL 2.0 08/05/2018   VLDL 38 01/07/2017   LDLCALC 54 08/05/2018   LDLCALC 56 01/07/2017   Lab Results  Component Value Date   TSH 1.233 10/27/2019   TSH 0.563 03/15/2016    Therapeutic Level Labs: No results found for: LITHIUM Lab Results  Component Value Date   VALPROATE 20 (L) 09/09/2016   VALPROATE 46 (L) 07/10/2016   No components found for:  CBMZ  Current Medications: Current Outpatient Medications  Medication Sig Dispense Refill  . albuterol (PROVENTIL) (2.5 MG/3ML) 0.083% nebulizer solution Take 3 mLs (2.5 mg total) by nebulization every 6 (six) hours as needed for wheezing or shortness of breath. 75 mL 5  . albuterol (VENTOLIN HFA) 108 (90 Base) MCG/ACT inhaler Inhale 2 puffs into the lungs every 6 (six) hours as needed for wheezing or shortness of breath. 18 g 2  . Asenapine Maleate (SAPHRIS) 10 MG SUBL Place 1 tablet (10 mg total) under the tongue in the morning and at bedtime. 60 tablet 2  . atorvastatin (LIPITOR) 80 MG tablet Take 1  tablet (80 mg  total) by mouth daily. 90 tablet 0  . Blood Pressure Monitoring (BLOOD PRESSURE CUFF) MISC 1 Units by Does not apply route daily. 1 each 0  . colchicine 0.6 MG tablet Take 1 tablet (0.6 mg total) by mouth 2 (two) times daily. 180 tablet 0  . diazepam (VALIUM) 10 MG tablet Take 1 tablet (10 mg total) by mouth every 8 (eight) hours as needed for anxiety. 90 tablet 1  . diclofenac (VOLTAREN) 75 MG EC tablet Take 1 tablet (75 mg total) by mouth 2 (two) times daily. 90 tablet 0  . diclofenac Sodium (VOLTAREN) 1 % GEL Apply 2 g topically 4 (four) times daily. 350 g 0  . Elastic Bandages & Supports (MEDICAL COMPRESSION STOCKINGS) MISC 1 Units by Does not apply route daily. 1 each 1  . febuxostat (ULORIC) 40 MG tablet Take 1 tablet (40 mg total) by mouth daily. 90 tablet 0  . ferrous sulfate 325 (65 FE) MG tablet Take 1 tablet (325 mg total) by mouth daily with breakfast. 180 tablet 0  . Fluticasone-Umeclidin-Vilant (TRELEGY ELLIPTA) 100-62.5-25 MCG/INH AEPB Inhale 1 puff into the lungs daily. 60 each 11  . gabapentin (NEURONTIN) 600 MG tablet Take 1 tablet (600 mg total) by mouth 2 (two) times daily. 180 tablet 1  . lisdexamfetamine (VYVANSE) 70 MG capsule Take 1 capsule (70 mg total) by mouth daily before breakfast. 30 capsule 0  . montelukast (SINGULAIR) 10 MG tablet Take 1 tablet (10 mg total) by mouth at bedtime. 30 tablet 11  . Multiple Vitamin (MULTIVITAMIN WITH MINERALS) TABS tablet Take 1 tablet by mouth daily.    [name] oxybutynin (DITROPAN-XL) 10 MG 24 hr tablet Take 1 tablet (10 mg total) by mouth at bedtime. 90 tablet 0  . potassium chloride SA (KLOR-CON) 20 MEQ tablet Take 2 tablets (40 mEq total) by mouth daily. 360 tablet 1  . prazosin (MINIPRESS) 5 MG capsule Take 1 capsule (5 mg total) by mouth 2 (two) times daily. 60 capsule 2  . saccharomyces boulardii (FLORASTOR) 250 MG capsule Take 1 capsule (250 mg total) by mouth 2 (two) times daily. 20 capsule 0  . traZODone (DESYREL) 100  MG tablet Take 1.5 tablet (50 mg) in am and one tablet (100 mg) at bedtime 45 tablet 2   No current facility-administered medications for this visit.      Psychiatric Specialty Exam: Review of Systems  Musculoskeletal: Positive for back pain.  Psychiatric/Behavioral: The patient is nervous/anxious.   All other systems reviewed and are negative.   There were no vitals taken for this visit.There is no height or weight on file to calculate BMI.  General Appearance: NA  Eye Contact:  NA  Speech:  Clear and Coherent and Normal Rate  Volume:  Normal  Mood:  Anxious  Affect:  NA  Thought Process:  Goal Directed  Orientation:  Full (Time, Place, and Person)  Thought Content: Rumination   Suicidal Thoughts:  No  Homicidal Thoughts:  No  Memory:  Immediate;   Good Recent;   Good Remote;   Good  Judgement:  Good  Insight:  Good  Psychomotor Activity:  NA  Concentration:  Concentration: Good  Recall:  Good  Fund of Knowledge: Good  Language: Good  Akathisia:  Negative  Handed:  Right  AIMS (if indicated): not done  Assets:  Communication Skills Desire for Improvement Financial Resources/Insurance Housing Resilience  ADL's:  Intact  Cognition: WNL  Sleep:  Good   Screenings: AUDIT  Admission (Discharged) from 12/01/2015 in [facility]  Alcohol Use Disorder Identification Test Final Score (AUDIT) 0    GAD-7     Office Visit from 03/21/2018 in [facility] for [facility]  Total GAD-7 Score 4    PHQ2-9     Office Visit from confidential encounter on 12/11/2018 Office Visit from confidential encounter on 12/10/2018 Office Visit from confidential encounter on 11/18/2018 Office Visit from confidential encounter on 11/08/2018 Telemedicine from confidential encounter on 10/25/2018  PHQ-2 Total Score 0 4 2 1 4   PHQ-9 Total Score -- 10 8 1 16        Assessment and Plan: 59yo divorced AAF with schizoaffective disorder bipolar type(vs  paranoid schizophrenia),chronicPTSD,remote hx ofcocaineaddiction (clean for4years)and bingeeating disorder.She tried ziprazidone but developedQTC prolongation on itso it was changed toSaphriswhich she believes is a best medication she has ever been on. [name] also on escitalopram andwas onVyvanse for binge eating disorder. Haedyn used to weigh over 300 lbs and since starting Vyvanse her binge eating is well controlled and she was able to lose a lot of weight). She continues to take diazepam 10 mgtidand trazodone for sleep (100 mg plus 50 mg in am "for nerves").She has a strained relationship with her family - mother in particular, but also son.  [name] not comfortable living with her mother but has an independent apartment as of this month. She denies feeling particularly depressed but remains anxious and feels that generic asenapine which she got instead of Saphris is less effecting in controling paranoia/irritability/anxiety. Shesound less paranoid though than during last few visits and reports beingcompliant with her medications.  Dx: Schizoaffective disorder bipolar type; PTSD chronic; Binge eating disorder  Plan: Continue trazodone 100 mg prn sleep plus 50 mg during the day for anxiety,Saphris10 mgbid,Vyvanse 70 mg,prazosin 5 mg bid, gabapentin 600 bid and diazepam 10 mg tid (escitalopram was dc during her hospital admission in early October).Next appointment in4weeks. The plan was discussed with patient who had an opportunity to ask questions and these were all answered.I spend13minutes in phone consultation with the patient.    Stephanie Acre, MD 12/10/2019, 10:21 AM

## 2019-12-15 ENCOUNTER — Telehealth (HOSPITAL_COMMUNITY): Payer: Self-pay | Admitting: *Deleted

## 2019-12-15 NOTE — Telephone Encounter (Signed)
Pharmacy called for clarification of Trazadone script.  Record was reviewed and determined Trazadone dose is to be 50mg  q am and 100mg  qhs.  Pharmacy requested that Trazadone 150mg  1/3 tab q am and 2/3 tab qhs be prescribed because  the 150mg  has 2 score lines.  Please review. Thanks.

## 2019-12-15 NOTE — Telephone Encounter (Signed)
Pt calling regarding this. Pt is also requesting to have a medical change of status. She states that she is needing to increase her nursing hours since her mother is not able to help her anymore. Pt is requesting to have someone in the home for 34-35 hours a week. Please advise.

## 2019-12-16 ENCOUNTER — Telehealth (HOSPITAL_COMMUNITY): Payer: Self-pay | Admitting: *Deleted

## 2019-12-16 NOTE — Telephone Encounter (Signed)
Patient called to discuss medication.  She was confused about where they had been sent for refills.  This Probation officer called and verified all medications to be filled as written.  Patient was difficult to understand and was not clear in her responses to questions.Patient then stated she needed to talk with you about changing her medications but could not give reasons and then stated  that you knew why.She was encouraged to make an earlier appointment but she did not want to do this.  She requested a phone call. Just wanted you to know.

## 2019-12-16 NOTE — Telephone Encounter (Signed)
Have attempted to fax to the number below multiple times & keep receiving an error message.

## 2019-12-16 NOTE — Telephone Encounter (Signed)
Opened in error

## 2019-12-16 NOTE — Telephone Encounter (Signed)
I would check with the patient first if she is OK with that change - Tishie is quite particular about her medications and I suspect she will be unhappy having to  break tablets (she is quite physically disabled).

## 2019-12-16 NOTE — Telephone Encounter (Signed)
Will check with her

## 2019-12-17 NOTE — Telephone Encounter (Signed)
Patient is very displeased she has not received a follow up call regarding the status of the below. Please advise patient today [phone].

## 2019-12-18 ENCOUNTER — Telehealth: Payer: Self-pay

## 2019-12-18 ENCOUNTER — Other Ambulatory Visit: Payer: Self-pay

## 2019-12-18 ENCOUNTER — Encounter (HOSPITAL_COMMUNITY): Payer: Self-pay | Admitting: Psychiatry

## 2019-12-18 ENCOUNTER — Ambulatory Visit (INDEPENDENT_AMBULATORY_CARE_PROVIDER_SITE_OTHER): Payer: Medicare Other | Admitting: Psychiatry

## 2019-12-18 DIAGNOSIS — F250 Schizoaffective disorder, bipolar type: Secondary | ICD-10-CM
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
## 2019-12-18 NOTE — Telephone Encounter (Signed)
I reached out to Tiffany [name]. and she stated that [facility].

## 2019-12-18 NOTE — Telephone Encounter (Signed)
Called patient and left voicemail informing her of the information below. Gave her the contact information to call [facility] request her aide come from [place].

## 2019-12-18 NOTE — Telephone Encounter (Signed)
See below.  If Tiffany [name] is agreeable to filling out a blank form that doesn't have Tiffany [name] on the top, I'll refax it since Tiffany [name] is out of the office. Or if she likes she can discuss during her office visit on 12/24/19.

## 2019-12-18 NOTE — Telephone Encounter (Signed)
Left voicemail informing patient that paperwork has been successfully faxed.

## 2019-12-18 NOTE — Telephone Encounter (Signed)
[facility]. They states that the fax number below is not theirs. Was told their fax number is [phone]. Sent FL-2 form completed by C. Mayers. Received confirmation.

## 2019-12-18 NOTE — Telephone Encounter (Signed)
This is the same fax number that we have been trying to send the forms to for the past few days. We continue to get error messages.

## 2019-12-18 NOTE — Telephone Encounter (Signed)
Message received from [facility] # [phone] stating that they received the fax for PCS. She said that the top of the form says to fax to [facility] healthcare. It should not be faxed directly to [facility].   This CM returned call to [facility], spoke to [place] and left message requesting a call back from [place].

## 2019-12-18 NOTE — Telephone Encounter (Signed)
Reason for CRM: Patient called asking to speak to Tiffany [name] and left this Fax# for Medicaid Fax# [phone] her Ph# [phone] Please advise

## 2019-12-18 NOTE — Telephone Encounter (Signed)
Beverely Low from [facility] returned Jane's call to give the Pts DOB./ DOB is [phone]

## 2019-12-19 IMAGING — CT CT HEAD W/O CM
3 series · 15 of 47 positions shown, 18 images · non-contrast
Comparison: Head CT dated 06/12/2016

CLINICAL DATA: 56-year-old female with syncope.

EXAM:
CT HEAD WITHOUT CONTRAST
TECHNIQUE: Contiguous axial images were obtained from the base of the skull
through the vertex without intravenous contrast.

[Series 3: head 5.0 h30s · axial · 0.44mm/px · z∈[-227,-97]mm · 9 of 32 slices shown, 12 images]
[im 3/32  brain]
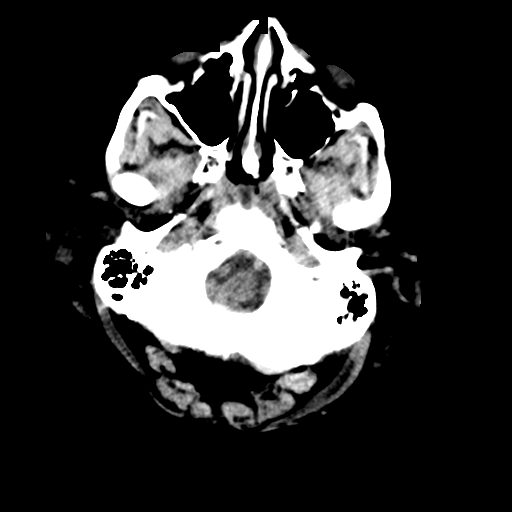
[im 3/32  bone]
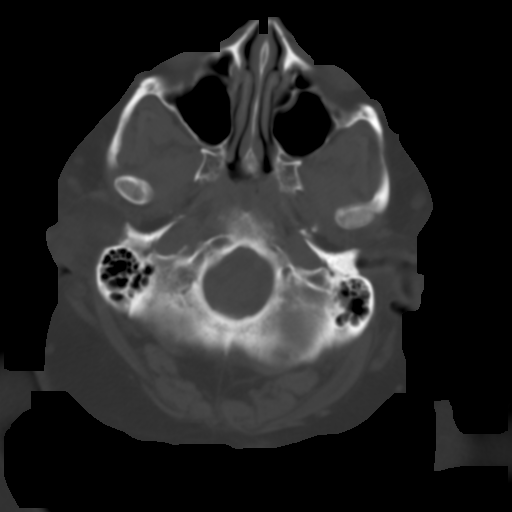
[im 6/32  brain]
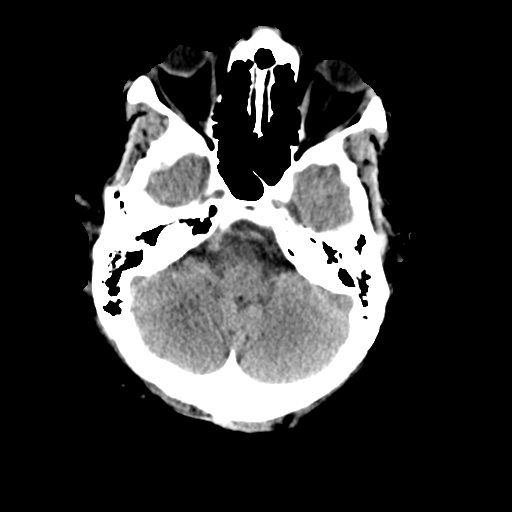
[im 9/32  brain]
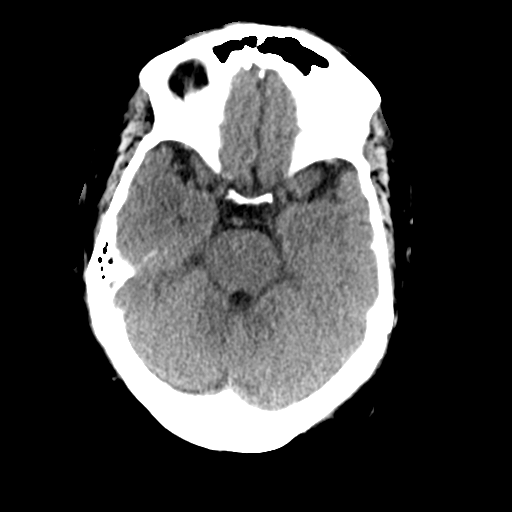
[im 12/32  brain]
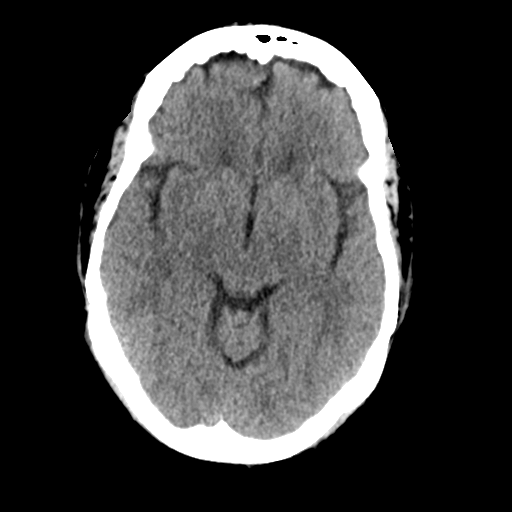
[im 17/32  brain]
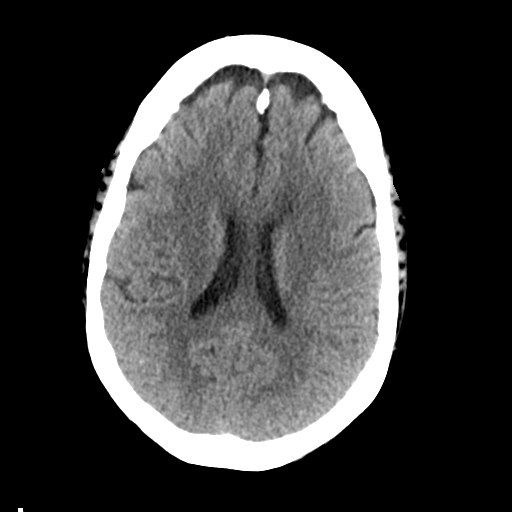
[im 17/32  bone]
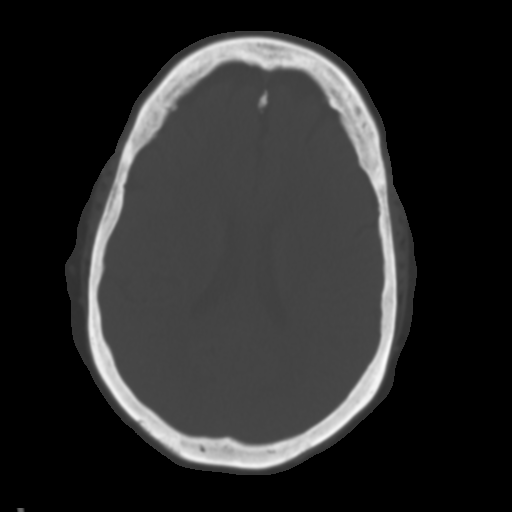
[im 20/32  brain]
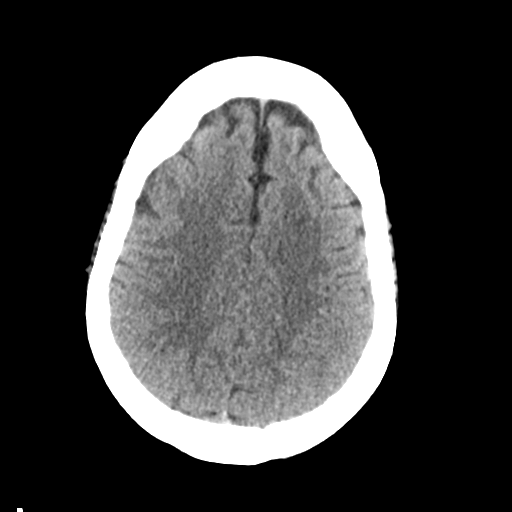
[im 23/32  brain]
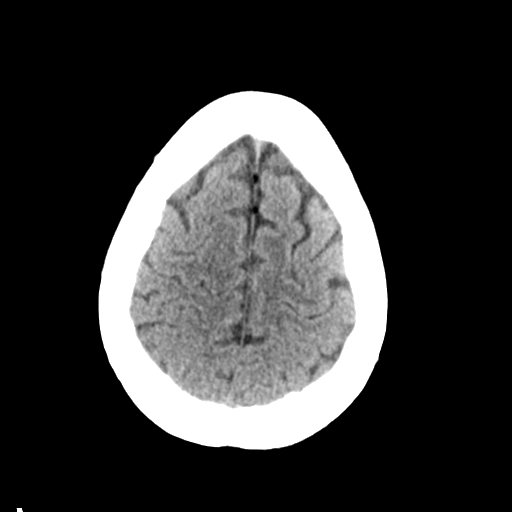
[im 26/32  brain]
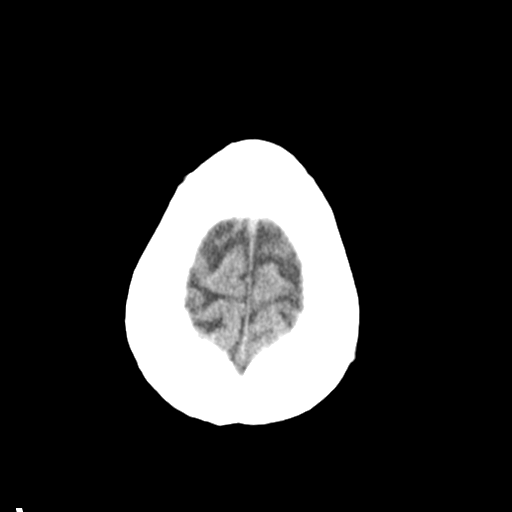
[im 29/32  brain]
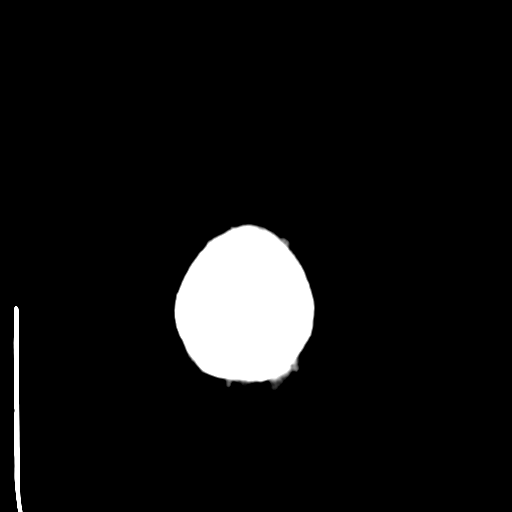
[im 29/32  bone]
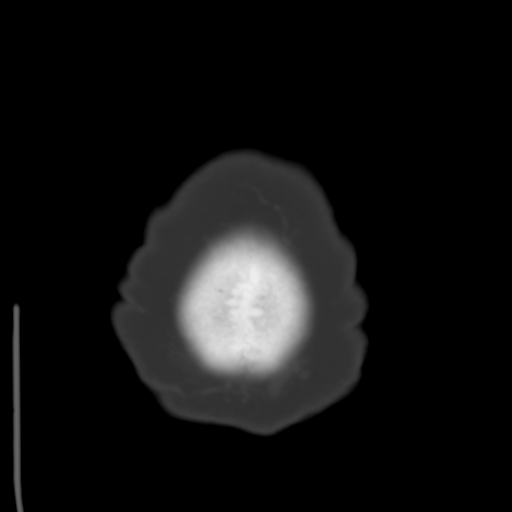

[Series 5: head 3.0 mpr cor · coronal · 0.31mm/px · 3 of 67 slices shown]
[im 23/67  brain]
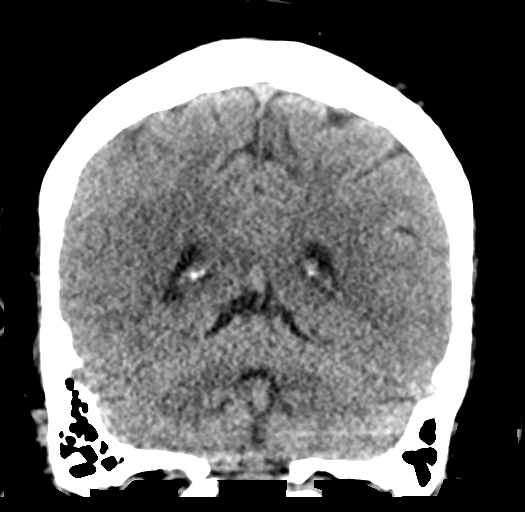
[im 30/67  brain]
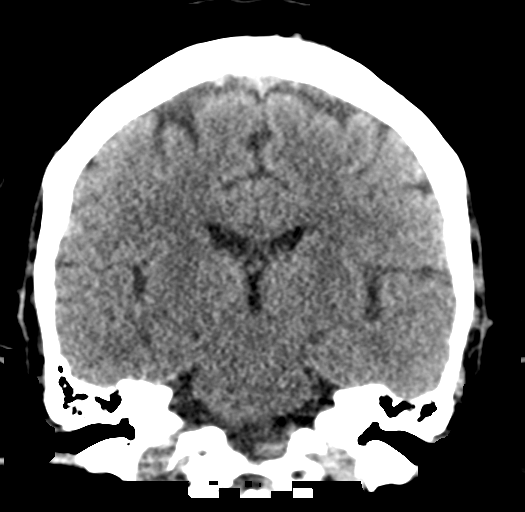
[im 37/67  brain]
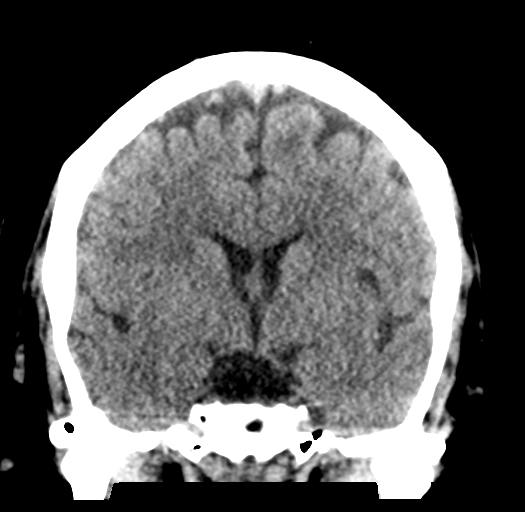

[Series 6: head 3.0 mpr sag · sagittal · 0.31mm/px · 3 of 57 slices shown]
[im 19/57  brain]
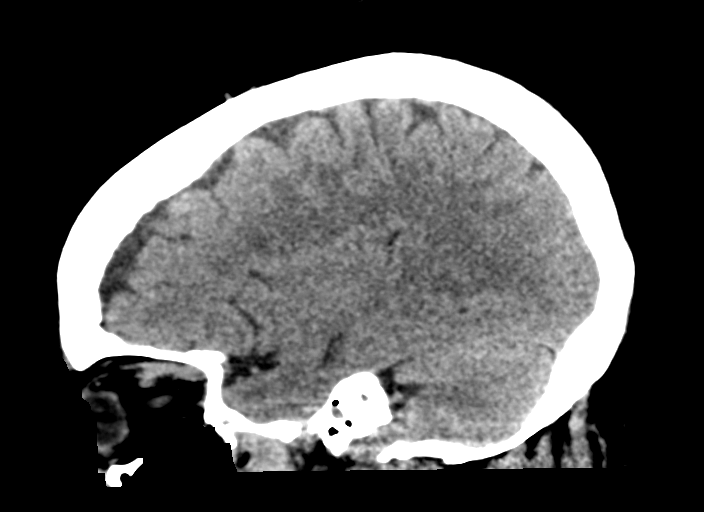
[im 29/57  brain]
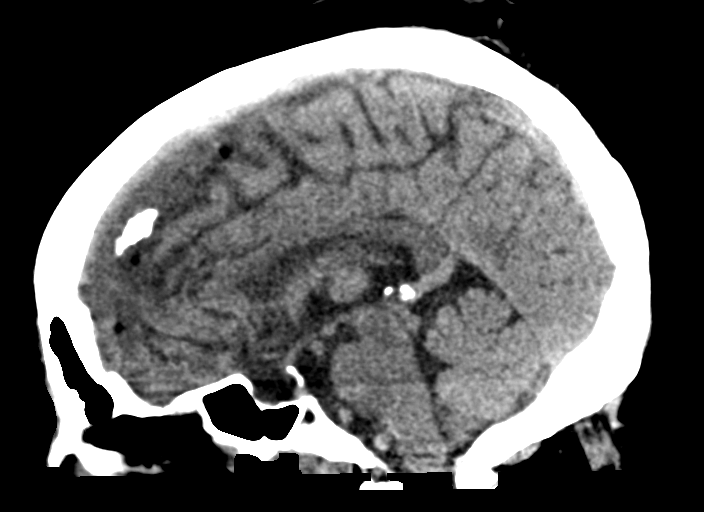
[im 38/57  brain]
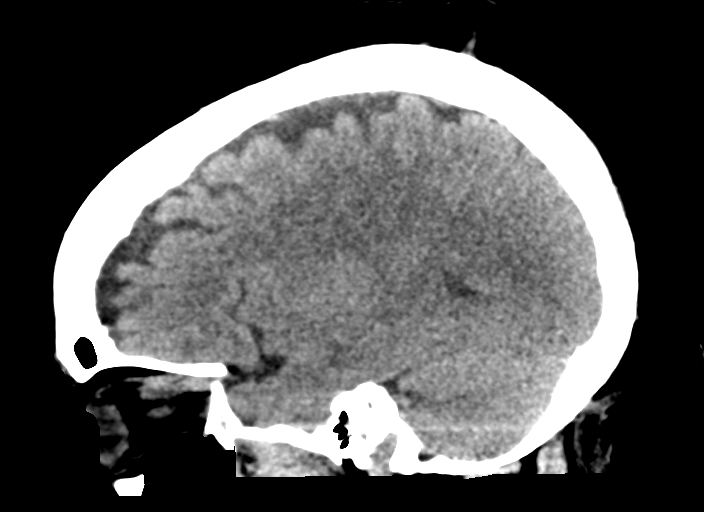

[15 of 47 positions shown; findings below may reference images not displayed]

FINDINGS: Brain: Mild bifrontal atrophy as well as chronic microvascular
ischemic changes. There is no acute intracranial hemorrhage. No mass
effect or midline shift. No extra-axial fluid collection. Probably
partially empty sella.

Vascular: No hyperdense vessel or unexpected calcification.

Skull: Normal. Negative for fracture or focal lesion.

Sinuses/Orbits: The visualized paranasal sinuses are clear. There is
mild bilateral mastoid effusion.

Other: None
IMPRESSION: 1. No acute intracranial pathology.
2. Mild age-related atrophy and chronic microvascular ischemic
changes.

## 2019-12-19 NOTE — Telephone Encounter (Signed)
Patient called back wanting to speak with the nurse asking for Opal Sidles to please call her at her earliest conveniencePh# [phone]

## 2019-12-19 NOTE — Telephone Encounter (Signed)
Returned call to  pt, not too happy with the services of her provider requested a letter for a Nurse Aid accomodation for 35 hr to be sent to [facility] for approval for more than a month ago and stated was never sent. Wanted to speak with Opal Sidles. Has upcoming appt at [facility] on 12/24/2019. Would like to know if possible to expedite her need. Thank you

## 2019-12-20 DIAGNOSIS — J449 Chronic obstructive pulmonary disease, unspecified: Secondary | ICD-10-CM | POA: Diagnosis not present

## 2019-12-20 IMAGING — DX DG CHEST 1V PORT
1 series · 1 of 1 positions shown · non-contrast
Comparison: Chest radiograph and CTA of the chest performed
11/03/2016

CLINICAL DATA: Acute onset of generalized weakness and syncope.

EXAM:
PORTABLE CHEST 1 VIEW

[chest]
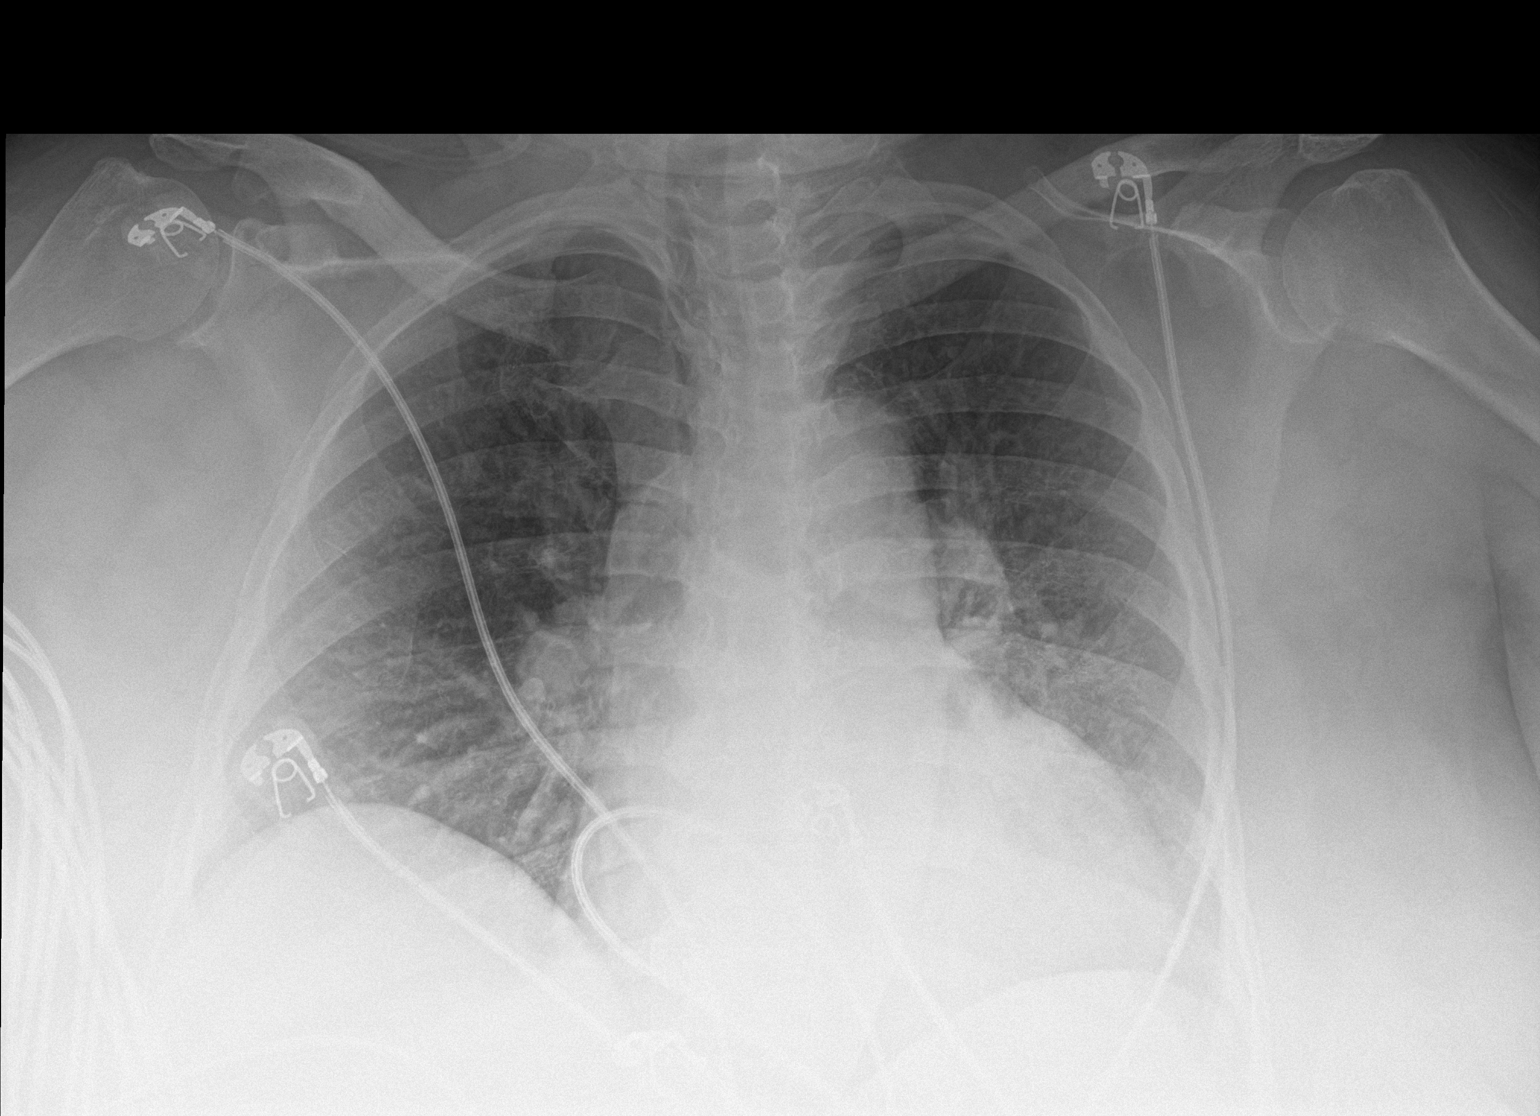

[1 of 1 positions shown; findings below may reference images not displayed]

FINDINGS: The lungs are hypoexpanded. Mild retrocardiac airspace opacity could
reflect pneumonia. No pleural effusion or pneumothorax is seen.

The cardiomediastinal silhouette is mildly enlarged. No acute
osseous abnormalities are identified.
IMPRESSION: 1. Lungs hypoexpanded. Mild retrocardiac airspace opacity could
reflect pneumonia.
2. Mild cardiomegaly.

## 2019-12-22 ENCOUNTER — Telehealth (HOSPITAL_COMMUNITY): Payer: Self-pay | Admitting: *Deleted

## 2019-12-22 ENCOUNTER — Other Ambulatory Visit (HOSPITAL_COMMUNITY): Payer: Self-pay | Admitting: Psychiatry

## 2019-12-22 NOTE — Telephone Encounter (Signed)
She told me to send all her meds to [place] on [address] which is closest to where she now lives and that is what I did. She has refills on Saphris there as well.

## 2019-12-22 NOTE — Telephone Encounter (Signed)
This needs to be addressed at an office visit I have never seen the patient.  Is she transferring care?  Her PCP can write this letter for her.

## 2019-12-22 NOTE — Telephone Encounter (Signed)
FYI pt called late on Friday asking for saphris refill. Writer called both pharmacies pt has listed and they both said she's had it filled on 11/21 but they can't see where. Pt doesn't remember.

## 2019-12-22 NOTE — Telephone Encounter (Signed)
Called pt made pt aware

## 2019-12-24 ENCOUNTER — Other Ambulatory Visit: Payer: Self-pay

## 2019-12-24 ENCOUNTER — Telehealth: Payer: Self-pay

## 2019-12-24 ENCOUNTER — Inpatient Hospital Stay: Payer: Medicare Other | Admitting: Family Medicine

## 2019-12-24 ENCOUNTER — Ambulatory Visit (INDEPENDENT_AMBULATORY_CARE_PROVIDER_SITE_OTHER): Payer: Medicare Other | Admitting: Psychiatry

## 2019-12-24 DIAGNOSIS — F250 Schizoaffective disorder, bipolar type: Secondary | ICD-10-CM

## 2019-12-24 NOTE — Telephone Encounter (Signed)
Call placed to patient regarding her request for increased PCS hours. She currently receives 20 hours /week and would like to increase to 35 hours /week due to her medical conditions and physical limitations. She has contacted [employer] and was informed that she needs her provider to complete a change in status form for [place] [facility] to re-assess her needs.  Scheduled her for an appointment with Dr Margarita Rana 12/29/2019 for her to discuss her current needs

## 2019-12-25 ENCOUNTER — Encounter (HOSPITAL_COMMUNITY): Payer: Self-pay | Admitting: Psychiatry

## 2019-12-25 DIAGNOSIS — J45909 Unspecified asthma, uncomplicated: Secondary | ICD-10-CM | POA: Diagnosis not present

## 2019-12-25 DIAGNOSIS — G4733 Obstructive sleep apnea (adult) (pediatric): Secondary | ICD-10-CM | POA: Diagnosis not present

## 2019-12-25 DIAGNOSIS — R062 Wheezing: Secondary | ICD-10-CM | POA: Diagnosis not present

## 2019-12-25 NOTE — Progress Notes (Signed)
Virtual Visit via Telephone Note  I connected with Tiffany [name] on 12/25/19 at  2:30 PM EST by telephone and verified that I am speaking with the correct person using two identifiers.  Location: Patient: Patient Home Provider: Home Office  I discussed the limitations of evaluation and management by telemedicine and the availability of in person appointments. The patient expressed understanding and agreed to proceed.  History of Present Illness: Schizoeffective DO Bipolar Type and Chronic PTSD  Treatment Plan Goals: Goal 1: Client would like to create and maintain a more stable living environment in order to better manage mental health care and routine with medication management and therapy participation to reduce anxiety and depression.  Goal 2: Client would like to process grief/loss issues and trauma experiences to heal from past to move forward in building healthy relationships with others.  Observations/Objective: Surveyor, quantity individual therapy via telephone. Counselor assessed MH symptoms and progress on treatment plan goals, with patient reporting, that she is experiencing sleep difficulties, "I've been up for 2 days", in a state of mania and paranoia related to safety of home/possessions, fear of mother and son coming in her home, and extensive verbalization of anger towards family members and former in-home aids. Client reports taking medications as prescribed and making efforts to sleep, without success. Clientpresents withmoderatedepression andmoderateanxiety.Clientdenied suicidal ideation or self-harm behaviors.Client did not report HI plan or intentions, but mentioned that if she was in danger she would defend herself, related to fear of home invasion   Goal 1: Counselor assessed daily functioning and housing stability. Client shared about challenges of living on her own, with mental health and physical health conditions. Counselor worked to understand  safety concerns and efforts to close gaps on independent living resources using MI interventions. Counselor prompted client to apply relaxation coping skills in order. Client able to self-regulate, with some challenges in maintaining calm state.   Goal 2: Counselor processed grief and loss triggers, as well as trauma responses since our last session. Client verbally expressed anger and fears surrounding past traumas associated with her mother and son. Counselor assessed safety and noted paranoid statements and phrases, stated in past session when experiencing mania/dysregulation of mental health conditions. Client used CBT techniques to examine evidence and to connect with reality. Counselor prompted use of grounding skills and communicating with trusted support people. Client stated willingness to comply with treatment recommendations.  Assessment and Plan: Patientwill continue to keep the office updated on her current contact information.Patient will continue to follow recommendations of providers and implement skills learned in session.  Follow Up Instructions: Counselor willcommunicate needs with new provider.  The patient was advised to call back or seek an in-person evaluation if the symptoms worsen or if the condition fails to improve as anticipated.  I provided20 minutes of non-face-to-face time during this encounter.   Lise Auer, LCSW

## 2019-12-27 ENCOUNTER — Other Ambulatory Visit (HOSPITAL_COMMUNITY): Payer: Self-pay | Admitting: Psychiatry

## 2019-12-28 ENCOUNTER — Other Ambulatory Visit: Payer: Self-pay | Admitting: Internal Medicine

## 2019-12-28 DIAGNOSIS — D649 Anemia, unspecified: Secondary | ICD-10-CM

## 2019-12-28 DIAGNOSIS — E785 Hyperlipidemia, unspecified: Secondary | ICD-10-CM

## 2019-12-29 ENCOUNTER — Telehealth: Payer: Self-pay | Admitting: Family Medicine

## 2019-12-29 ENCOUNTER — Ambulatory Visit: Payer: Medicare Other | Attending: Family Medicine | Admitting: Family Medicine

## 2019-12-29 ENCOUNTER — Other Ambulatory Visit: Payer: Self-pay

## 2019-12-29 ENCOUNTER — Encounter: Payer: Self-pay | Admitting: Family Medicine

## 2019-12-29 ENCOUNTER — Ambulatory Visit
Admission: RE | Admit: 2019-12-29 | Discharge: 2019-12-29 | Disposition: A | Discharge status: Home / Self Care | Payer: Medicare Other | Source: Ambulatory Visit | Attending: Family Medicine | Admitting: Family Medicine

## 2019-12-29 VITALS — BP 146/92 | HR 74

## 2019-12-29 DIAGNOSIS — F250 Schizoaffective disorder, bipolar type: Secondary | ICD-10-CM

## 2019-12-29 DIAGNOSIS — R296 Repeated falls: Secondary | ICD-10-CM | POA: Diagnosis not present

## 2019-12-29 DIAGNOSIS — G8929 Other chronic pain: Secondary | ICD-10-CM

## 2019-12-29 DIAGNOSIS — N3281 Overactive bladder: Secondary | ICD-10-CM | POA: Diagnosis not present

## 2019-12-29 DIAGNOSIS — K219 Gastro-esophageal reflux disease without esophagitis: Secondary | ICD-10-CM

## 2019-12-29 DIAGNOSIS — E785 Hyperlipidemia, unspecified: Secondary | ICD-10-CM | POA: Diagnosis not present

## 2019-12-29 DIAGNOSIS — D649 Anemia, unspecified: Secondary | ICD-10-CM | POA: Diagnosis not present

## 2019-12-29 DIAGNOSIS — M545 Low back pain, unspecified: Secondary | ICD-10-CM | POA: Diagnosis not present

## 2019-12-29 DIAGNOSIS — M48061 Spinal stenosis, lumbar region without neurogenic claudication: Secondary | ICD-10-CM | POA: Diagnosis not present

## 2019-12-29 DIAGNOSIS — M5442 Lumbago with sciatica, left side: Secondary | ICD-10-CM

## 2019-12-29 MED ORDER — OXYBUTYNIN CHLORIDE 5 MG PO TABS: 5.0000 mg | ORAL_TABLET | Freq: Two times a day (BID) | ORAL | 1 refills | Status: DC

## 2019-12-29 MED ORDER — ATORVASTATIN CALCIUM 80 MG PO TABS: 80.0000 mg | ORAL_TABLET | Freq: Every day | ORAL | 1 refills | Status: DC

## 2019-12-29 MED ORDER — OMEPRAZOLE 40 MG PO CPDR: 40.0000 mg | DELAYED_RELEASE_CAPSULE | Freq: Every day | ORAL | 1 refills | Status: DC

## 2019-12-29 MED ORDER — DICLOFENAC SODIUM 75 MG PO TBEC
75.0000 mg | DELAYED_RELEASE_TABLET | Freq: Two times a day (BID) | ORAL | 1 refills | Status: DC
Start: 2019-12-29 — End: 2020-02-26

## 2019-12-29 NOTE — Telephone Encounter (Signed)
Lumbar MRI scan shows arthritis in the facet joints on the back of the spine at L4-5 and L5-S1 along with disc bulging, and disc results in moderate to severe narrowing of the spinal canal and nerve openings.  I will ask Dr. Ernestina Patches whether an injection can be considered.  There is also a pelvic cyst measuring 7 cm which appears to be unchanged from CT scan in October.  She should discuss this finding with her PCP.

## 2019-12-29 NOTE — Progress Notes (Signed)
Would like to get more hours for her PCS.  States that she has pain all over her body.

## 2019-12-29 NOTE — Progress Notes (Signed)
Subjective:  Patient ID: Tiffany [name], female    DOB: 05-12-1960  Age: 59 y.o. MRN: [id number]  CC: Establish Care   HPI Tiffany [name] is a 59 year old female with a history of hypertension, COPD, schizoaffective disorder, dyslipidemia, stage III chronic kidney disease who presents today requesting a transfer of care.  She was previously followed by Dr. Juleen China her primary care [facility]. [name] accompanied by her Mom.  She gets PCS services, 2 hours/ day 7 days a week and would like 7 hours, 5 days a week as her Mom would help during the weekend. She can't stand up on her feet and falls all the time, states she stumbles everyday even with the assistance.  She has an upcoming appointment with GYN for follow up of  7.7cm adnexa cyst.  Labs reveal anemia with a hemoglobin of 9.0. States she has had a Colonoscopy in the summer of this year in [place].  Also states she should be on Omeprazole which does not appear on her med list.  [name] requesting a prescription for oxybutynin twice daily but I have informed her this is an extended release medication and should be taking once daily but she insists on a twice daily medication.  Past Medical History:  Diagnosis Date  . Agitation 11/22/2017  . Anoxic brain injury ([place]) 09/08/2016   C. Arrest due to respiratory failure and COPD exacerbation  . Anxiety   . Arthritis    "all over" (04/10/2016)  . Asthma 10/18/2010  . Binge eating disorder   . Cardiac arrest ([place]) 09/08/2016   PEA  . Carotid artery stenosis    1-39% bilateral by dopplers 11/2016  . Chronic diastolic (congestive) heart failure ([place])   . Chronic kidney disease    "I see a kidney dr." (04/10/2016)  . Chronic pain syndrome 06/18/2012  . Chronic post-traumatic stress disorder (PTSD) 05/27/2018  . Chronic respiratory failure with hypoxia and hypercapnia (HCC) 06/22/2015   TRILOGY Vent >AVAPA-ES., Vt target 200-400, Max P 30 , PS max 20 , PS min 6-10 , E Max 6, E Min 4, Rate Auto  AVAPS Rate 2 (titrate for pt comfort) , bleed O2 at 5l/m continuous flow .   [name] CKD (chronic kidney disease) stage 3, GFR 30-59 ml/min (HCC) 12/15/2016  . Closed displaced fracture of fifth metacarpal bone 03/21/2018  . Cocaine use disorder, severe, in sustained remission ([place]) 12/17/2015  . Complication of anesthesia    decreased bp, decreased heart rate  . COPD (chronic obstructive pulmonary disease) ([place]) 07/08/2014  . Depression   . Diabetic neuropathy ([place]) 04/24/2011  . Difficulty with speech 01/24/2018  . Disorder of nervous system   . Drug abuse ([place]) 11/21/2017  . Dyslipidemia 04/24/2011  . Elevated troponin 04/28/2012  . Emphysema   . Encephalopathy 11/21/2017  . Essential hypertension 03/22/2016  . Fibula fracture 07/10/2016  . Frequent falls 10/11/2017  . GERD (gastroesophageal reflux disease)   . Gout 04/11/2017  . Heart attack ([place]) 1980s  . History of blood transfusion 1994   "couldn't stop bleeding from my period"  . History of drug abuse in remission ([place]) 11/28/2015   Quit in 2017  . Hyperlipidemia LDL goal <70   . Incontinence   . Manic depression ([place])   . Morbid obesity ([place]) 10/18/2010  . Obstructive sleep apnea 10/18/2010  . On home oxygen therapy    "6L; 24/7" (04/10/2016)  . OSA on CPAP    "wear mask sometimes" (04/10/2016)  . Paranoid (  [place])    "sometimes; I'm on RX for it" (04/10/2016)  . Prolonged Q-T interval on ECG   . Rectal bleeding 12/31/2015  . Right carotid bruit 11/09/2016  . Schizoaffective disorder, bipolar type ([place]) 04/05/2018  . Seasonal allergies   . Seborrheic keratoses 12/31/2013  . Seizures ([place])    "don't know what kind; last one was ~ 1 yr ago" (04/10/2016)  . Stroke [facility]) 1980s   denies residual on 04/10/2016  . Thrush 09/19/2013  . Type 2 diabetes mellitus ([place]) 10/18/2010    Past Surgical History:  Procedure Laterality Date  . CESAREAN SECTION  1997  . HERNIA REPAIR    . IR CHOLANGIOGRAM EXISTING TUBE  07/20/2016  . IR PERC CHOLECYSTOSTOMY   05/10/2016  . IR RADIOLOGIST EVAL & MGMT  06/08/2016  . IR RADIOLOGIST EVAL & MGMT  06/29/2016  . IR SINUS/FIST TUBE CHK-NON GI  07/12/2016  . RIGHT/LEFT HEART CATH AND CORONARY ANGIOGRAPHY N/A 06/19/2017   Procedure: RIGHT/LEFT HEART CATH AND CORONARY ANGIOGRAPHY;  Surgeon: Jolaine Artist, MD;  Location: [facility];  Service: Cardiovascular;  Laterality: N/A;  . TIBIA IM NAIL INSERTION Right 07/12/2016   Procedure: INTRAMEDULLARY (IM) NAIL RIGHT TIBIA;  Surgeon: Leandrew Koyanagi, MD;  Location: [facility];  Service: Orthopedics;  Laterality: Right;  . UMBILICAL HERNIA REPAIR  ~ 1963   "that's why I don't have a belly button"  . VAGINAL HYSTERECTOMY      Family History  Problem Relation Age of Onset  . Cancer Father        prostate  . Cancer Mother        lung  . Depression Mother   . Depression Sister   . Anxiety disorder Sister   . Schizophrenia Sister   . Bipolar disorder Sister   . Depression Sister   . Depression Brother   . Heart failure Other        cousin    Allergies  Allergen Reactions  . Hydrocodone Shortness Of Breath  . Hydrocodone-Acetaminophen Shortness Of Breath  . Hydroxyzine Anaphylaxis and Shortness Of Breath  . Latuda [Lurasidone Hcl] Anaphylaxis  . Lurasidone Anaphylaxis  . Magnesium-Containing Compounds Anaphylaxis  . Prednisone Anaphylaxis, Swelling and Other (See Comments)    Tongue swelling  . Tramadol Anaphylaxis and Swelling  . Codeine Nausea And Vomiting  . Other Rash  . Sulfa Antibiotics Itching  . Tape Rash    Outpatient Medications Prior to Visit  Medication Sig Dispense Refill  . albuterol (PROVENTIL) (2.5 MG/3ML) 0.083% nebulizer solution Take 3 mLs (2.5 mg total) by nebulization every 6 (six) hours as needed for wheezing or shortness of breath. 75 mL 5  . albuterol (VENTOLIN HFA) 108 (90 Base) MCG/ACT inhaler Inhale 2 puffs into the lungs every 6 (six) hours as needed for wheezing or shortness of breath. 18 g 2  . Asenapine Maleate  (SAPHRIS) 10 MG SUBL Place 1 tablet (10 mg total) under the tongue in the morning and at bedtime. 60 tablet 2  . Blood Pressure Monitoring (BLOOD PRESSURE CUFF) MISC 1 Units by Does not apply route daily. 1 each 0  . colchicine 0.6 MG tablet Take 1 tablet (0.6 mg total) by mouth 2 (two) times daily. 180 tablet 0  . diazepam (VALIUM) 10 MG tablet Take 1 tablet (10 mg total) by mouth every 8 (eight) hours as needed for anxiety. 90 tablet 1  . diclofenac Sodium (VOLTAREN) 1 % GEL Apply 2 g topically 4 (four) times daily.  350 g 0  . Elastic Bandages & Supports (MEDICAL COMPRESSION STOCKINGS) MISC 1 Units by Does not apply route daily. 1 each 1  . febuxostat (ULORIC) 40 MG tablet Take 1 tablet (40 mg total) by mouth daily. 90 tablet 0  . ferrous sulfate 325 (65 FE) MG tablet Take 1 tablet (325 mg total) by mouth daily with breakfast. 180 tablet 0  . Fluticasone-Umeclidin-Vilant (TRELEGY ELLIPTA) 100-62.5-25 MCG/INH AEPB Inhale 1 puff into the lungs daily. 60 each 11  . gabapentin (NEURONTIN) 600 MG tablet Take 1 tablet (600 mg total) by mouth 2 (two) times daily. 180 tablet 1  . lisdexamfetamine (VYVANSE) 70 MG capsule Take 1 capsule (70 mg total) by mouth daily before breakfast. 30 capsule 0  . montelukast (SINGULAIR) 10 MG tablet Take 1 tablet (10 mg total) by mouth at bedtime. 30 tablet 11  . Multiple Vitamin (MULTIVITAMIN WITH MINERALS) TABS tablet Take 1 tablet by mouth daily.    . potassium chloride SA (KLOR-CON) 20 MEQ tablet Take 2 tablets (40 mEq total) by mouth daily. 360 tablet 1  . prazosin (MINIPRESS) 5 MG capsule Take 1 capsule (5 mg total) by mouth 2 (two) times daily. 60 capsule 2  . saccharomyces boulardii (FLORASTOR) 250 MG capsule Take 1 capsule (250 mg total) by mouth 2 (two) times daily. 20 capsule 0  . traZODone (DESYREL) 100 MG tablet Take 1.5 tablet (50 mg) in am and one tablet (100 mg) at bedtime 45 tablet 2  . atorvastatin (LIPITOR) 80 MG tablet Take 1 tablet (80 mg total) by  mouth daily. 90 tablet 0  . diclofenac (VOLTAREN) 75 MG EC tablet Take 1 tablet (75 mg total) by mouth 2 (two) times daily. 90 tablet 0  . oxybutynin (DITROPAN-XL) 10 MG 24 hr tablet Take 1 tablet (10 mg total) by mouth at bedtime. 90 tablet 0   No facility-administered medications prior to visit.     ROS Review of Systems  Constitutional: Negative for activity change, appetite change and fatigue.  HENT: Negative for congestion, sinus pressure and sore throat.   Eyes: Negative for visual disturbance.  Respiratory: Negative for cough, chest tightness, shortness of breath and wheezing.   Cardiovascular: Negative for chest pain and palpitations.  Gastrointestinal: Negative for abdominal distention, abdominal pain and constipation.  Endocrine: Negative for polydipsia.  Genitourinary: Negative for dysuria and frequency.  Musculoskeletal: Negative for arthralgias and back pain.  Skin: Negative for rash.  Neurological: Negative for tremors, light-headedness and numbness.  Hematological: Does not bruise/bleed easily.  Psychiatric/Behavioral: Negative for agitation and behavioral problems.    Objective:  BP (!) 146/92   Pulse 74   SpO2 96%   BP/Weight 12/29/2019 11/25/2019 50/27/7412  Systolic BP 878 676 720  Diastolic BP 92 98 85  Wt. (Lbs) - - -  BMI - - -  Some encounter information is confidential and restricted. Go to Review Flowsheets activity to see all data.      Physical Exam Constitutional:      Appearance: [name] well-developed.  Neck:     Vascular: No JVD.  Cardiovascular:     Rate and Rhythm: Normal rate.     Heart sounds: Normal heart sounds. No murmur heard.   Pulmonary:     Effort: Pulmonary effort is normal.     Breath sounds: Normal breath sounds. No wheezing or rales.  Chest:     Chest wall: No tenderness.  Abdominal:     General: Bowel sounds are normal. There is no  distension.     Palpations: Abdomen is soft. There is no mass.     Tenderness: There  is no abdominal tenderness.  Musculoskeletal:        General: Normal range of motion.     Right lower leg: No edema.     Left lower leg: No edema.  Neurological:     Mental Status: [name] alert and oriented to person, place, and time.  Psychiatric:        Mood and Affect: Mood normal.     CMP Latest Ref Rng & Units 11/25/2019 11/02/2019 11/01/2019  Glucose 65 - 99 mg/dL 75 102(H) 114(H)  BUN 8 - 27 mg/dL 13 11 14   Creatinine 0.57 - 1.00 mg/dL 1.22(H) 1.22(H) 1.27(H)  Sodium 134 - 144 mmol/L 142 137 143  Potassium 3.5 - 5.2 mmol/L 3.5 3.8 4.0  Chloride 96 - 106 mmol/L 111(H) 107 109  CO2 22 - 32 mmol/L - 23 24  Calcium 8.7 - 10.3 mg/dL 8.6(L) 8.3(L) 8.7(L)  Total Protein 6.0 - 8.5 g/dL 5.7(L) - -  Total Bilirubin 0.0 - 1.2 mg/dL <0.2 - -  Alkaline Phos 44 - 121 IU/L 123(H) - -  AST 0 - 40 IU/L 13 - -  ALT 0 - 44 U/L - - -    Lipid Panel     Component Value Date/Time   CHOL 127 08/05/2018 1748   TRIG 53 08/05/2018 1748   HDL 62 08/05/2018 1748   CHOLHDL 2.0 08/05/2018 1748   CHOLHDL 3.7 01/07/2017 0005   VLDL 38 01/07/2017 0005   LDLCALC 54 08/05/2018 1748   LDLDIRECT 52 04/05/2012 1210    CBC    Component Value Date/Time   WBC 5.0 11/25/2019 1642   WBC 10.1 10/31/2019 0420   RBC 3.13 (L) 11/25/2019 1642   RBC 3.71 (L) 10/31/2019 0420   HGB 9.0 (L) 11/25/2019 1642   HCT 28.2 (L) 11/25/2019 1642   PLT 375 11/25/2019 1642   MCV 90 11/25/2019 1642   MCH 28.8 11/25/2019 1642   MCH 28.0 10/31/2019 0420   MCHC 31.9 11/25/2019 1642   MCHC 32.1 10/31/2019 0420   RDW 15.4 11/25/2019 1642   LYMPHSABS 2.0 11/25/2019 1642   MONOABS 0.4 10/31/2019 0420   EOSABS 0.2 11/25/2019 1642   BASOSABS 0.0 11/25/2019 1642    Lab Results  Component Value Date   HGBA1C 5.6 11/08/2018    Assessment & Plan:  1. Dyslipidemia Stable - atorvastatin (LIPITOR) 80 MG tablet; Take 1 tablet (80 mg total) by mouth daily.  Dispense: 90 tablet; Refill: 1  2. Overactive bladder Patient  requests a twice a day since I have switched her from extended release to immediate release - oxybutynin (DITROPAN) 5 MG tablet; Take 1 tablet (5 mg total) by mouth 2 (two) times daily.  Dispense: 180 tablet; Refill: 1  3. Anemia, unspecified type Last hemoglobin from 11/2019 was 9.0 Could be anemia of chronic disease We will request colonoscopy from [facility]  4. Gastroesophageal reflux disease without esophagitis Controlled - omeprazole (PRILOSEC) 40 MG capsule; Take 1 capsule (40 mg total) by mouth daily.  Dispense: 90 capsule; Refill: 1  5. Frequent falls We will complete PCS form  6. Schizoaffective disorder, bipolar type [facility]) Management as per psychiatry    Meds ordered this encounter  Medications  . omeprazole (PRILOSEC) 40 MG capsule    Sig: Take 1 capsule (40 mg total) by mouth daily.    Dispense:  90 capsule    Refill:  1  . oxybutynin (DITROPAN) 5 MG tablet    Sig: Take 1 tablet (5 mg total) by mouth 2 (two) times daily.    Dispense:  180 tablet    Refill:  1  . diclofenac (VOLTAREN) 75 MG EC tablet    Sig: Take 1 tablet (75 mg total) by mouth 2 (two) times daily.    Dispense:  90 tablet    Refill:  1  . atorvastatin (LIPITOR) 80 MG tablet    Sig: Take 1 tablet (80 mg total) by mouth daily.    Dispense:  90 tablet    Refill:  1    Follow-up: Return in about 3 months (around 03/28/2020) for Chronic disease management.       [name], MD, FAAFP. [facility], [place]   12/29/2019, 12:07 PM

## 2019-12-30 ENCOUNTER — Telehealth: Payer: Self-pay | Admitting: Family Medicine

## 2019-12-30 ENCOUNTER — Other Ambulatory Visit: Payer: Self-pay | Admitting: Family Medicine

## 2019-12-30 DIAGNOSIS — G8929 Other chronic pain: Secondary | ICD-10-CM

## 2019-12-30 NOTE — Telephone Encounter (Signed)
Patient called to ask if the doctor or nurse can prescribe a stronger pain medication.  Please advise and call patient to discuss at [phone]

## 2019-12-30 NOTE — Telephone Encounter (Signed)
She just requested a refill of Diclofenac but did not complain of pain at her visit.

## 2019-12-30 NOTE — Telephone Encounter (Signed)
Pt states that diclofenac has an effect on her kidneys, pt states that she is having pain in her back.

## 2019-12-30 NOTE — Telephone Encounter (Signed)
Pt states that medication that was sent to pharmacy yesterday is not helping with her pain.

## 2019-12-30 NOTE — Telephone Encounter (Signed)
Pt called and stated the Diclofenac messes with her kidneys and she stated she had been taking it but it shuts her kidneys down / Pt stated that she would like a different medication / and hopefully nothing with additional side effects /please advise   Pt stated she is having pain from her butt to her back/ Pt stated she tried to explain to Dr. Margarita Rana that she was in pain/ Please advise asap / Pt asked for Dr. Margarita Rana to call her please

## 2019-12-30 NOTE — Telephone Encounter (Signed)
I called and advised the patient of the plan for ESI. She will be seeing her GYN on 01/02/20, who can manage the pelvic cyst issue.

## 2019-12-31 ENCOUNTER — Encounter (HOSPITAL_COMMUNITY): Payer: Self-pay | Admitting: Psychiatry

## 2019-12-31 ENCOUNTER — Other Ambulatory Visit: Payer: Self-pay

## 2019-12-31 ENCOUNTER — Other Ambulatory Visit (HOSPITAL_COMMUNITY): Payer: Self-pay | Admitting: Psychiatry

## 2019-12-31 ENCOUNTER — Telehealth: Payer: Self-pay | Admitting: Family Medicine

## 2019-12-31 ENCOUNTER — Telehealth (HOSPITAL_COMMUNITY): Payer: Self-pay | Admitting: *Deleted

## 2019-12-31 ENCOUNTER — Ambulatory Visit (INDEPENDENT_AMBULATORY_CARE_PROVIDER_SITE_OTHER): Payer: Medicare Other | Admitting: Psychiatry

## 2019-12-31 DIAGNOSIS — F250 Schizoaffective disorder, bipolar type: Secondary | ICD-10-CM | POA: Diagnosis not present

## 2019-12-31 DIAGNOSIS — E785 Hyperlipidemia, unspecified: Secondary | ICD-10-CM

## 2019-12-31 DIAGNOSIS — M109 Gout, unspecified: Secondary | ICD-10-CM

## 2019-12-31 DIAGNOSIS — F4310 Post-traumatic stress disorder, unspecified: Secondary | ICD-10-CM

## 2019-12-31 MED ORDER — ATORVASTATIN CALCIUM 80 MG PO TABS: 80.0000 mg | ORAL_TABLET | Freq: Every day | ORAL | 1 refills | Status: DC

## 2019-12-31 MED ORDER — TIZANIDINE HCL 4 MG PO TABS: 4.0000 mg | ORAL_TABLET | Freq: Three times a day (TID) | ORAL | 1 refills | Status: DC | PRN

## 2019-12-31 MED ORDER — ESCITALOPRAM OXALATE 20 MG PO TABS: 20.0000 mg | ORAL_TABLET | Freq: Every day | ORAL | 2 refills | Status: DC

## 2019-12-31 MED ORDER — COLCHICINE 0.6 MG PO TABS: 0.6000 mg | ORAL_TABLET | Freq: Two times a day (BID) | ORAL | 0 refills | Status: DC

## 2019-12-31 NOTE — Telephone Encounter (Signed)
Medication Refill - Medication: atorvastatin, colchicine   Has the patient contacted their pharmacy? Yes.  Pt states that she is completely out of these medications. Please advise.  (Agent: If no, request that the patient contact the pharmacy for the refill.) (Agent: If yes, when and what did the pharmacy advise?)  Preferred Pharmacy (with phone number or street name):  [facility] [place] - [address]  [place] [address]  Phone: [phone] Fax: [phone]  Hours: Not open 24 hours     Agent: Please be advised that RX refills may take up to 3 business days. We ask that you follow-up with your pharmacy.

## 2019-12-31 NOTE — Progress Notes (Signed)
Virtual Visit via Video Note  I connected with Tiffany [name] on 12/31/19 at  2:30 PM EST by a video enabled telemedicine application and verified that I am speaking with the correct person using two identifiers.  Location: Patient: Patient Home Provider: Home Office  I discussed the limitations of evaluation and management by telemedicine and the availability of in person appointments. The patient expressed understanding and agreed to proceed.  History of Present Illness: Schizoeffective DO Bipolar Type and Chronic PTSD  Treatment Plan Goals: Goal 1: Client would like to create and maintain a more stable living environment in order to better manage mental health care and routine with medication management and therapy participation to reduce anxiety and depression.  Goal 2: Client would like to process grief/loss issues and trauma experiences to heal from past to move forward in building healthy relationships with others.  Observations/Objective: Counselor met withJillfor individual therapy viatelephone. Counselor assessed MH symptoms and progress on treatment plan goals, with patient reporting concerns with home safety, in a paranoid manner. Client reports currently safe and supported by friends and others in support services. Client audibly pacing and restless, moving about her home while in session. Counselor prompted to ensure safety and for relaxation application skills. Clientpresents withmoderatedepression andmoderateanxiety.Clientdenied suicidal ideation or self-harm behaviors.  Goal 1: Counselor assessed daily functioning and housing stability. Client noted that she has replaced the locks on her home again, from fears that family members may have access to her home. Client reports efforts to request changes and housing needs with management, however they are unresponsive. Client made list of a variety of issues and concerns she has with household features. Counselor  prompted for grounding and relaxation skills to reduce hypervigilance and anxiety. Client had minimal success with skill application, as she became fixated on leaving home to communicate needs with management. Counselor ended session, as Client began to leave home disengaged from therapeutic process. Counselor to reestablish boundaries and expectations of telepsyc at next appointment.  Assessment and Plan: Patientwill continue to keep the office updated on her current contact information.Patient will continue to follow recommendations of providers and implement skills learned in session.  Follow Up Instructions: Counselor willcommunicate needs with new provider.  The patient was advised to call back or seek an in-person evaluation if the symptoms worsen or if the condition fails to improve as anticipated.  I provided35mnutes of non-face-to-face time during this encounter.   BLise Auer LCSW

## 2019-12-31 NOTE — Telephone Encounter (Signed)
Pt has been called and a voicemail was left informing patient to return phone call.

## 2019-12-31 NOTE — Telephone Encounter (Signed)
Tiffany [name] called this morning requesting refill of the Lexapro. Looks like it was d/c during inpt in 10/21, however she has still been taking it. Pt has an appointment on 12/28. Please review. Thanks.

## 2019-12-31 NOTE — Telephone Encounter (Signed)
Prescription for tizanidine has been sent to her pharmacy

## 2019-12-31 NOTE — Telephone Encounter (Signed)
If she has been taking it it would be unwise to stop it - I renewed Rx.

## 2019-12-31 NOTE — Telephone Encounter (Signed)
  Notes to clinic: Per pharmacy patient is trying to get medication filled to early they have the script for the Atorvastatin but can not fill until 1/5/20201. They will need a script for the colchicine but still want be able to fill until 01/21/2019. Pharmacy states that they made patient aware of this. Please advise    Requested Prescriptions  Pending Prescriptions Disp Refills   colchicine 0.6 MG tablet 180 tablet 0    Sig: Take 1 tablet (0.6 mg total) by mouth 2 (two) times daily.      There is no refill protocol information for this order      atorvastatin (LIPITOR) 80 MG tablet 90 tablet 1    Sig: Take 1 tablet (80 mg total) by mouth daily.      There is no refill protocol information for this order

## 2020-01-01 ENCOUNTER — Ambulatory Visit: Payer: Medicare Other | Admitting: Internal Medicine

## 2020-01-01 ENCOUNTER — Other Ambulatory Visit: Payer: Self-pay

## 2020-01-01 ENCOUNTER — Telehealth: Payer: Self-pay | Admitting: Family Medicine

## 2020-01-01 DIAGNOSIS — E785 Hyperlipidemia, unspecified: Secondary | ICD-10-CM

## 2020-01-01 DIAGNOSIS — M109 Gout, unspecified: Secondary | ICD-10-CM

## 2020-01-01 MED ORDER — TIZANIDINE HCL 4 MG PO TABS: 4.0000 mg | ORAL_TABLET | Freq: Three times a day (TID) | ORAL | 1 refills | Status: DC | PRN

## 2020-01-01 MED ORDER — COLCHICINE 0.6 MG PO TABS: 0.6000 mg | ORAL_TABLET | Freq: Two times a day (BID) | ORAL | 0 refills | Status: DC

## 2020-01-01 MED ORDER — ATORVASTATIN CALCIUM 80 MG PO TABS: 80.0000 mg | ORAL_TABLET | Freq: Every day | ORAL | 1 refills | Status: DC

## 2020-01-01 NOTE — Telephone Encounter (Signed)
Pt was called and medications has been changed to correct pharmacy.  Pts PCS form will be updated and faxed over.

## 2020-01-01 NOTE — Telephone Encounter (Signed)
Copied from [place] [id number]. Topic: General - Other >> Jan 01, 2020  9:27 AM Rainey Pines A wrote: Patient called to inform that all her medication have been sent to the [facility] and that the medications need to go to [place], [address] [place] [address]  Phone:  [phone] Fax:  [phone] Patient is also requesting a callback from [place] to confirm that the paperwork that was faxed reflect the status change she is requesting for her nurse health aid to come to her home 40 hrs a week. Please advise

## 2020-01-01 NOTE — Telephone Encounter (Signed)
Copied from [place] #[id number]. Topic: General - Other >> Dec 31, 2019  9:03 AM Celene Kras wrote: Reason for CRM: Pt calling and is requesting to have an update regarding the change of status from yesterday. Please advise . >> Dec 31, 2019 12:02 PM Rainey Pines A wrote: Patient stated that she would like a callback from Dr. Josefine Class nurse in regards to a staus update report on her medication change  Before 1pm today. Patient stated that she is still awaiting her callback  from yesterday. Best contact [phone]  Please follow up.

## 2020-01-02 ENCOUNTER — Other Ambulatory Visit (HOSPITAL_COMMUNITY)
Admission: RE | Admit: 2020-01-02 | Discharge: 2020-01-02 | Disposition: A | Discharge status: Home / Self Care | Payer: Medicare Other | Source: Ambulatory Visit | Attending: Obstetrics & Gynecology | Admitting: Obstetrics & Gynecology

## 2020-01-02 ENCOUNTER — Other Ambulatory Visit: Payer: Self-pay

## 2020-01-02 ENCOUNTER — Ambulatory Visit (INDEPENDENT_AMBULATORY_CARE_PROVIDER_SITE_OTHER): Payer: Medicare Other | Admitting: Obstetrics & Gynecology

## 2020-01-02 ENCOUNTER — Encounter: Payer: Self-pay | Admitting: Obstetrics & Gynecology

## 2020-01-02 VITALS — BP 147/93 | HR 81 | Wt 157.0 lb

## 2020-01-02 DIAGNOSIS — N898 Other specified noninflammatory disorders of vagina: Secondary | ICD-10-CM | POA: Insufficient documentation

## 2020-01-02 DIAGNOSIS — N949 Unspecified condition associated with female genital organs and menstrual cycle: Secondary | ICD-10-CM | POA: Insufficient documentation

## 2020-01-02 DIAGNOSIS — R1900 Intra-abdominal and pelvic swelling, mass and lump, unspecified site: Secondary | ICD-10-CM

## 2020-01-02 IMAGING — CR DG CHEST 2V
2 series · 2 of 2 positions shown · non-contrast
Comparison: 01/06/2017

CLINICAL DATA: Shortness of breath with cough and nasal congestion
for 1 week.

EXAM:
CHEST  2 VIEW

[w chest lat]
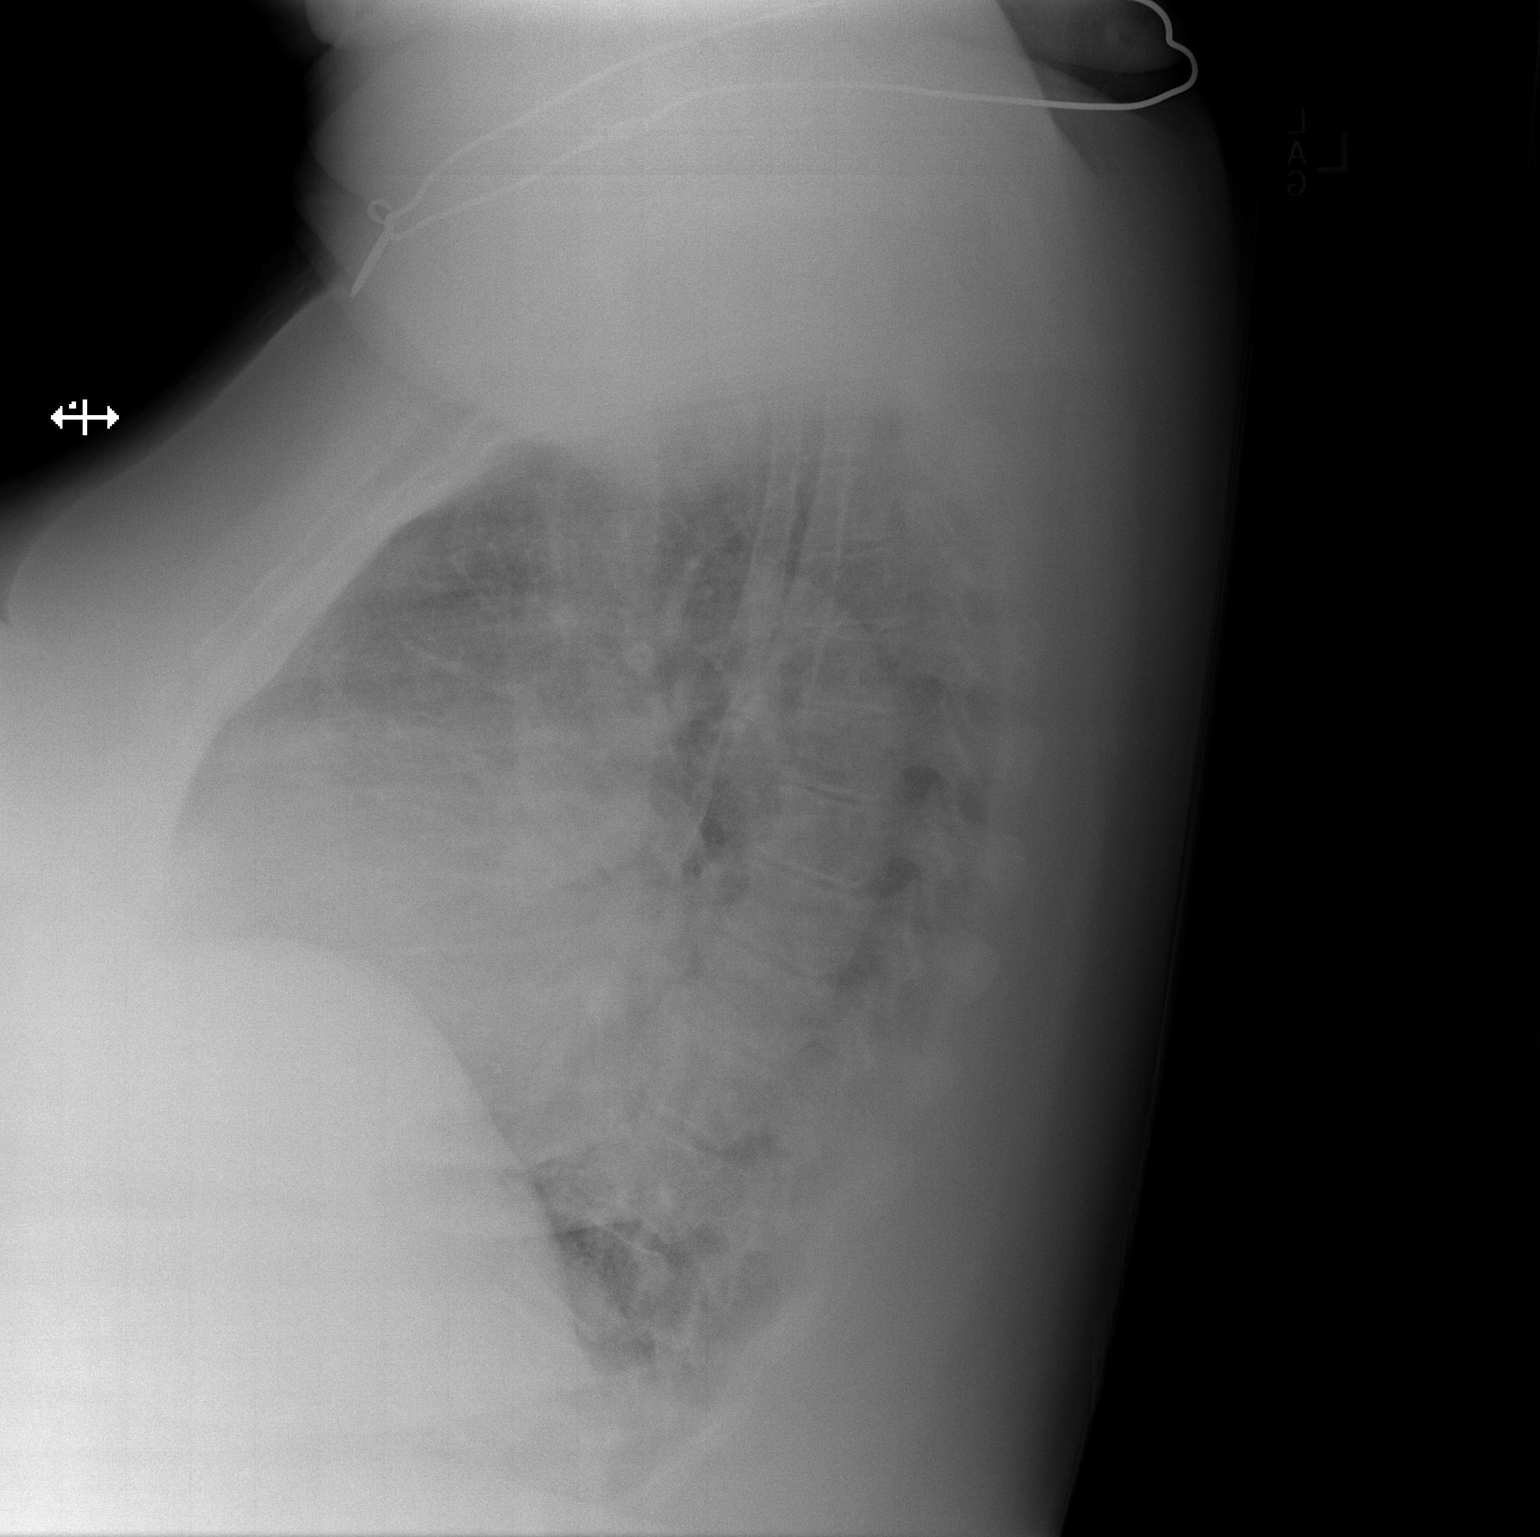

[x chest ap]
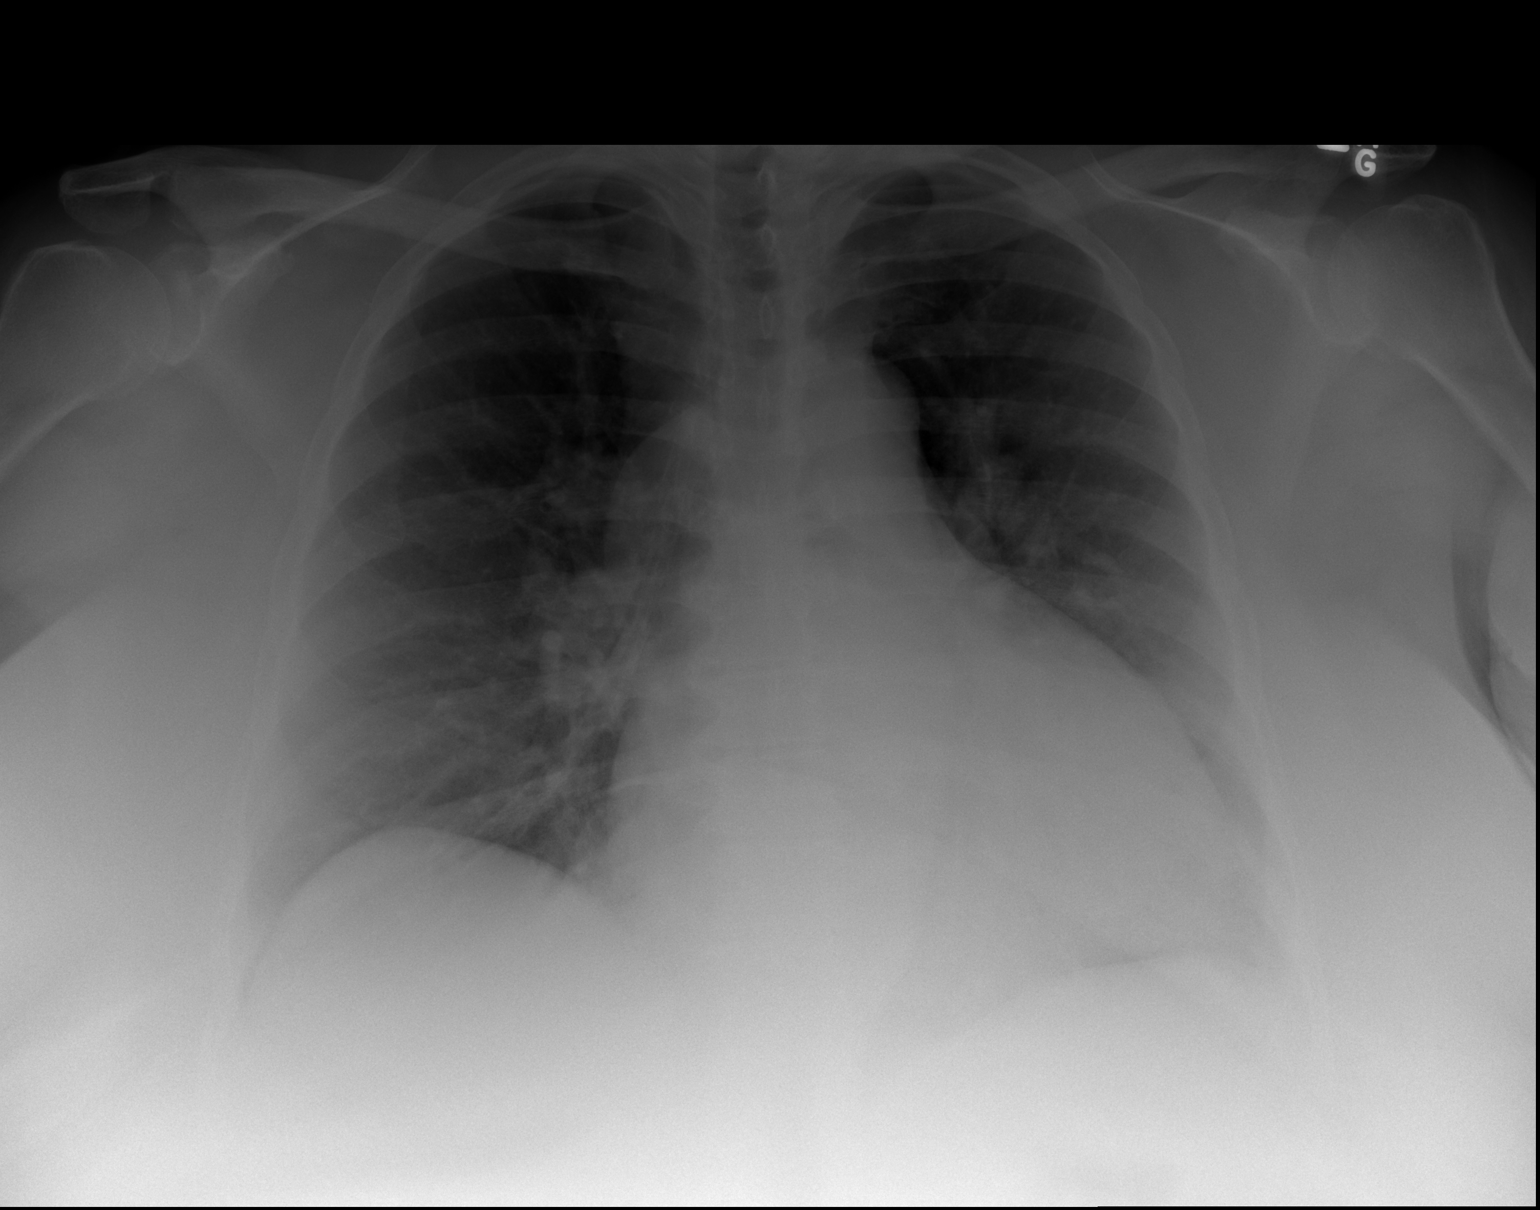

[2 of 2 positions shown; findings below may reference images not displayed]

FINDINGS: Shallow inspiration. Cardiac enlargement. No vascular congestion,
edema, or consolidation. No blunting of costophrenic angles. No
pneumothorax. Mediastinal contours appear intact.
IMPRESSION: Cardiac enlargement.  No evidence of active pulmonary disease.

## 2020-01-02 NOTE — Patient Instructions (Signed)
Transvaginal Ultrasound A transvaginal ultrasound, also called an endovaginal ultrasound, is a test that uses sound waves to take pictures of the female genital tract. The pictures are taken with a device, called a transducer, that is placed in the vagina. This test may be done to:  Check for problems with your pregnancy.  Check your developing baby.  Check for anything abnormal in the uterus or ovaries.  Find out why you have pelvic pain or bleeding. Tell a health care provider about:  Any allergies you have.  All medicines you are taking, including vitamins, herbs, eye drops, creams, and over-the-counter medicines.  Any blood disorders you have.  Any surgeries you have had.  Any medical conditions you have.  Whether you are pregnant or may be pregnant.  Whether you are having your menstrual period. What are the risks? This is a safe procedure. There are no known risks or complications of having this test. What happens before the procedure? This procedure needs to be done when your bladder is empty. Follow your health care provider's instructions about drinking fluids and emptying your bladder before the test. What happens during the procedure?   You will empty your bladder before the procedure.  You will undress from the waist down.  You will lie down on an exam table, with your knees bent and your feet in foot holders.  A health care provider will cover the transducer with a sterile cover.  A gel will be put on the transducer. The gel helps transmit the sound waves and prevents irritation of your vagina.  The technician will insert the transducer into your vagina to get images. These will be displayed on a monitor that looks like a small television screen.  The transducer will be removed when the procedure is complete. The procedure may vary among health care providers and hospitals. What happens after the procedure?  It is up to you to get the results of your  procedure. Ask your health care provider, or the department that is doing the procedure, when your results will be ready.  Keep all follow-up visits as told by your health care provider. This is important. Summary  A transvaginal ultrasound, also called an endovaginal ultrasound, is a test that uses sound waves to take pictures of the female genital tract.  This is a safe procedure. There are no known risks associated with this test.  The procedure needs to be done when your bladder is empty. Follow your health care provider's instructions about drinking fluids and emptying your bladder before the test.  During the procedure, you will undress from the waist down and lie down on an exam table. A technician will insert a transducer into your vagina to obtain images.  Ask your health care provider, or the department that is doing the procedure, when your results will be ready. This information is not intended to replace advice given to you by your health care provider. Make sure you discuss any questions you have with your health care provider. Document Revised: 08/15/2017 Document Reviewed: 08/15/2017 Elsevier Patient Education  2020 Elsevier Inc.  

## 2020-01-02 NOTE — Addendum Note (Signed)
Addended by: Tamela Oddi on: 01/02/2020 12:20 PM   Modules accepted: Orders

## 2020-01-02 NOTE — Progress Notes (Addendum)
New GYN referral from [facility] for Cyst.  C/o pelvic pain 10/10 that shoots up her back when she sits down, vaginal and anal bleeding, odor.   Hx Hysterectomy (partial)

## 2020-01-02 NOTE — Progress Notes (Signed)
Patient ID: Tiffany [name], female   DOB: 16-Sep-1960, 59 y.o.   MRN: [id number]  Chief Complaint  Patient presents with  . NEW GYN    Referral from [facility]    HPI Tiffany [name] is a 59 y.o. female.  [id number] S/p TVH many years ago cannot report the date, states surgery had complications and she was transfused. She recently was evaluated for rectal bleeding and CT and MRI showed left pelvic cystic mass that was simple, 7+ cm HPI  Past Medical History:  Diagnosis Date  . Agitation 11/22/2017  . Anoxic brain injury ([place]) 09/08/2016   C. Arrest due to respiratory failure and COPD exacerbation  . Anxiety   . Arthritis    "all over" (04/10/2016)  . Asthma 10/18/2010  . Binge eating disorder   . Cardiac arrest ([place]) 09/08/2016   PEA  . Carotid artery stenosis    1-39% bilateral by dopplers 11/2016  . Chronic diastolic (congestive) heart failure ([place])   . Chronic kidney disease    "I see a kidney dr." (04/10/2016)  . Chronic pain syndrome 06/18/2012  . Chronic post-traumatic stress disorder (PTSD) 05/27/2018  . Chronic respiratory failure with hypoxia and hypercapnia (HCC) 06/22/2015   TRILOGY Vent >AVAPA-ES., Vt target 200-400, Max P 30 , PS max 20 , PS min 6-10 , E Max 6, E Min 4, Rate Auto AVAPS Rate 2 (titrate for pt comfort) , bleed O2 at 5l/m continuous flow .   [name] CKD (chronic kidney disease) stage 3, GFR 30-59 ml/min (HCC) 12/15/2016  . Closed displaced fracture of fifth metacarpal bone 03/21/2018  . Cocaine use disorder, severe, in sustained remission ([place]) 12/17/2015  . Complication of anesthesia    decreased bp, decreased heart rate  . COPD (chronic obstructive pulmonary disease) ([place]) 07/08/2014  . Depression   . Diabetic neuropathy ([place]) 04/24/2011  . Difficulty with speech 01/24/2018  . Disorder of nervous system   . Drug abuse ([place]) 11/21/2017  . Dyslipidemia 04/24/2011  . Elevated troponin 04/28/2012  . Emphysema   . Encephalopathy 11/21/2017  . Essential hypertension 03/22/2016  .  Fibula fracture 07/10/2016  . Frequent falls 10/11/2017  . GERD (gastroesophageal reflux disease)   . Gout 04/11/2017  . Heart attack ([place]) 1980s  . History of blood transfusion 1994   "couldn't stop bleeding from my period"  . History of drug abuse in remission ([place]) 11/28/2015   Quit in 2017  . Hyperlipidemia LDL goal <70   . Incontinence   . Manic depression ([place])   . Morbid obesity ([place]) 10/18/2010  . Obstructive sleep apnea 10/18/2010  . On home oxygen therapy    "6L; 24/7" (04/10/2016)  . OSA on CPAP    "wear mask sometimes" (04/10/2016)  . Paranoid ([place])    "sometimes; I'm on RX for it" (04/10/2016)  . Prolonged Q-T interval on ECG   . Rectal bleeding 12/31/2015  . Right carotid bruit 11/09/2016  . Schizoaffective disorder, bipolar type ([place]) 04/05/2018  . Seasonal allergies   . Seborrheic keratoses 12/31/2013  . Seizures ([place])    "don't know what kind; last one was ~ 1 yr ago" (04/10/2016)  . Stroke [facility]) 1980s   denies residual on 04/10/2016  . Thrush 09/19/2013  . Type 2 diabetes mellitus ([place]) 10/18/2010    Past Surgical History:  Procedure Laterality Date  . CESAREAN SECTION  1997  . HERNIA REPAIR    . IR CHOLANGIOGRAM EXISTING TUBE  07/20/2016  . IR PERC CHOLECYSTOSTOMY  05/10/2016  .  IR RADIOLOGIST EVAL & MGMT  06/08/2016  . IR RADIOLOGIST EVAL & MGMT  06/29/2016  . IR SINUS/FIST TUBE CHK-NON GI  07/12/2016  . RIGHT/LEFT HEART CATH AND CORONARY ANGIOGRAPHY N/A 06/19/2017   Procedure: RIGHT/LEFT HEART CATH AND CORONARY ANGIOGRAPHY;  Surgeon: Jolaine Artist, MD;  Location: [facility];  Service: Cardiovascular;  Laterality: N/A;  . TIBIA IM NAIL INSERTION Right 07/12/2016   Procedure: INTRAMEDULLARY (IM) NAIL RIGHT TIBIA;  Surgeon: Leandrew Koyanagi, MD;  Location: [facility];  Service: Orthopedics;  Laterality: Right;  . UMBILICAL HERNIA REPAIR  ~ 1963   "that's why I don't have a belly button"  . VAGINAL HYSTERECTOMY      Family History  Problem Relation Age of Onset   . Cancer Father        prostate  . Cancer Mother        lung  . Depression Mother   . Depression Sister   . Anxiety disorder Sister   . Schizophrenia Sister   . Bipolar disorder Sister   . Depression Sister   . Depression Brother   . Heart failure Other        cousin    Social History Social History   Tobacco Use  . Smoking status: Former Smoker    Packs/day: 1.50    Years: 38.00    Pack years: 57.00    Types: Cigarettes    Start date: 03/13/1977    Quit date: 04/10/2016    Years since quitting: 3.7  . Smokeless tobacco: Never Used  Vaping Use  . Vaping Use: Never used  Substance Use Topics  . Alcohol use: No    Alcohol/week: 0.0 standard drinks  . Drug use: No    Types: Cocaine    Comment: 04/10/2016 "last used cocaine back in November 2017"    Allergies  Allergen Reactions  . Hydrocodone Shortness Of Breath  . Hydrocodone-Acetaminophen Shortness Of Breath  . Hydroxyzine Anaphylaxis and Shortness Of Breath  . Latuda [Lurasidone Hcl] Anaphylaxis  . Lurasidone Anaphylaxis  . Magnesium-Containing Compounds Anaphylaxis  . Prednisone Anaphylaxis, Swelling and Other (See Comments)    Tongue swelling  . Tramadol Anaphylaxis and Swelling  . Codeine Nausea And Vomiting  . Other Rash  . Sulfa Antibiotics Itching  . Tape Rash    Current Outpatient Medications  Medication Sig Dispense Refill  . albuterol (PROVENTIL) (2.5 MG/3ML) 0.083% nebulizer solution Take 3 mLs (2.5 mg total) by nebulization every 6 (six) hours as needed for wheezing or shortness of breath. 75 mL 5  . albuterol (VENTOLIN HFA) 108 (90 Base) MCG/ACT inhaler Inhale 2 puffs into the lungs every 6 (six) hours as needed for wheezing or shortness of breath. 18 g 2  . Asenapine Maleate (SAPHRIS) 10 MG SUBL Place 1 tablet (10 mg total) under the tongue in the morning and at bedtime. 60 tablet 2  . atorvastatin (LIPITOR) 80 MG tablet Take 1 tablet (80 mg total) by mouth daily. 90 tablet 1  . Blood Pressure  Monitoring (BLOOD PRESSURE CUFF) MISC 1 Units by Does not apply route daily. 1 each 0  . colchicine 0.6 MG tablet Take 1 tablet (0.6 mg total) by mouth 2 (two) times daily. 180 tablet 0  . diazepam (VALIUM) 10 MG tablet Take 1 tablet (10 mg total) by mouth every 8 (eight) hours as needed for anxiety. 90 tablet 1  . diclofenac (VOLTAREN) 75 MG EC tablet Take 1 tablet (75 mg total) by mouth 2 (two)  times daily. 90 tablet 1  . diclofenac Sodium (VOLTAREN) 1 % GEL Apply 2 g topically 4 (four) times daily. 350 g 0  . Elastic Bandages & Supports (MEDICAL COMPRESSION STOCKINGS) MISC 1 Units by Does not apply route daily. 1 each 1  . escitalopram (LEXAPRO) 20 MG tablet Take 1 tablet (20 mg total) by mouth daily. 30 tablet 2  . febuxostat (ULORIC) 40 MG tablet Take 1 tablet (40 mg total) by mouth daily. 90 tablet 0  . ferrous sulfate 325 (65 FE) MG tablet Take 1 tablet (325 mg total) by mouth daily with breakfast. 180 tablet 0  . Fluticasone-Umeclidin-Vilant (TRELEGY ELLIPTA) 100-62.5-25 MCG/INH AEPB Inhale 1 puff into the lungs daily. 60 each 11  . gabapentin (NEURONTIN) 600 MG tablet Take 1 tablet (600 mg total) by mouth 2 (two) times daily. 180 tablet 1  . lisdexamfetamine (VYVANSE) 70 MG capsule Take 1 capsule (70 mg total) by mouth daily before breakfast. 30 capsule 0  . montelukast (SINGULAIR) 10 MG tablet Take 1 tablet (10 mg total) by mouth at bedtime. 30 tablet 11  . Multiple Vitamin (MULTIVITAMIN WITH MINERALS) TABS tablet Take 1 tablet by mouth daily.    [name] omeprazole (PRILOSEC) 40 MG capsule Take 1 capsule (40 mg total) by mouth daily. 90 capsule 1  . oxybutynin (DITROPAN) 5 MG tablet Take 1 tablet (5 mg total) by mouth 2 (two) times daily. 180 tablet 1  . potassium chloride SA (KLOR-CON) 20 MEQ tablet Take 2 tablets (40 mEq total) by mouth daily. 360 tablet 1  . prazosin (MINIPRESS) 5 MG capsule TAKE 1 CAPSULE BY MOUTH TWICE DAILY 180 capsule 0  . saccharomyces boulardii (FLORASTOR) 250 MG  capsule Take 1 capsule (250 mg total) by mouth 2 (two) times daily. 20 capsule 0  . tiZANidine (ZANAFLEX) 4 MG tablet Take 1 tablet (4 mg total) by mouth every 8 (eight) hours as needed for muscle spasms. 60 tablet 1  . traZODone (DESYREL) 100 MG tablet Take 1.5 tablet (50 mg) in am and one tablet (100 mg) at bedtime 45 tablet 2   No current facility-administered medications for this visit.    Review of Systems Review of Systems  Constitutional: Positive for unexpected weight change (weight loss).  Respiratory: Negative.   Cardiovascular: Negative.   Gastrointestinal: Positive for anal bleeding and blood in stool.  Genitourinary: Positive for vaginal discharge. Negative for pelvic pain. Vaginal bleeding: spotting.       Urinary incontinence    Blood pressure (!) 147/93, pulse 81, weight 157 lb (71.2 kg).  Physical Exam Physical Exam Vitals and nursing note reviewed. Exam conducted with a chaperone present.  Constitutional:      Appearance: [name] ill-appearing (weak, uses cane and WC).  Cardiovascular:     Rate and Rhythm: Normal rate.  Pulmonary:     Effort: Pulmonary effort is normal.  Abdominal:     Palpations: Abdomen is soft. There is no mass.     Tenderness: There is abdominal tenderness (mild general). There is no guarding.  Genitourinary:    General: Normal vulva.     Vagina: Vaginal discharge present.     Uterus: Absent.      Adnexa: Right adnexa normal and left adnexa normal.  Neurological:     Mental Status: [name] alert.     Data Reviewed  Narrative & Impression  CLINICAL DATA:  59 year old female with low back pain, bilateral sciatica.  EXAM: MRI LUMBAR SPINE WITHOUT CONTRAST  TECHNIQUE: Multiplanar, multisequence  MR imaging of the lumbar spine was performed. No intravenous contrast was administered.  COMPARISON:  Lumbar radiographs 12/17/2013. CT Abdomen and Pelvis 10/30/2019. CTA chest 08/22/2015.  FINDINGS: Segmentation: 12 full sized  pairs of ribs, and 6 lumbar type vertebral bodies demonstrated on prior CTs such that fully lumbarized S1 level is designated for the purposes of this report. Correlation with radiographs is recommended prior to any operative intervention.  Alignment: Mildly exaggerated lumbar lordosis, stable since October. Mild grade 1 anterolisthesis at L4-L5 and L5-S1.  Vertebrae: No marrow edema or evidence of acute osseous abnormality. Background bone marrow signal within normal limits. Intact visible sacrum and SI joints.  Conus medullaris and cauda equina: Conus extends to the L1-L2 level. r No lower spinal cord or conus signal abnormality.  Paraspinal and other soft tissues: Large, at least 7.4 cm diameter cyst at the pelvic inlet redemonstrated and appear to arise from the left ovary on the October CT. Size appears stable. Visible portions of the cyst appear simple.  Negative visible abdominal viscera. Negative lumbar paraspinal soft tissues.  Disc levels:  T12-L1:  Negative.  L1-L2:  Negative.  L2-L3:  Mild facet hypertrophy.  Otherwise negative.  L3-L4: Moderate facet and ligament flavum hypertrophy. Mild foraminal disc bulging. No spinal or lateral recess stenosis. Borderline to mild bilateral L3 foraminal stenosis.  L4-L5: Mild anterolisthesis with disc space loss and circumferential disc bulge. Disc extrusion small superimposed midline and slightly on series 3, image 9. Cephalad moderate to severe facet and ligament flavum hypertrophy. Moderate to severe spinal stenosis. Moderate bilateral lateral recess stenosis (L5 nerve levels). Mild to moderate left and mild right L4 foraminal stenosis.  L5-S1: Grade 1 anterolisthesis with disc desiccation, disc space loss, circumferential disc bulge. Moderate to severe ligament flavum hypertrophy. Moderate to severe facet hypertrophy, although question of developing facet ankylosis at this level on series 6 image  26). Mild to moderate lateral recess stenosis greater on the left (left S1 nerve level). Borderline to mild spinal stenosis. Mild to moderate left and borderline right L5 foraminal stenosis.  S1-S2: Fully lumbarized. Mild circumferential disc bulge. Superimposed small midline annular fissure of the disc on series 3, image 9. Mild to moderate facet hypertrophy. No stenosis.  IMPRESSION: 1. Transitional lumbosacral anatomy with confirm 12 full size ribs and 6 lumbar type vertebrae. Lumbarized S1 level with full size S1-S2 disc space designated for the purposes of this report. Correlation with radiographs is recommended prior to any operative intervention.  2. Grade 1 anterolisthesis at L4-L5 and L5-S1 with disc and advanced posterior element degeneration. Possible developing facet ankylosis at the latter. Subsequent multifactorial moderate to severe spinal, moderate lateral recess and foraminal stenosis at L4-L5. Up to moderate left lateral recess and foraminal stenosis at L5-S1.  3. Large, unilocular pelvic cyst (7.4 cm) appears stable since an October CT Abdomen and Pelvis (please see that report).   Electronically Signed   By: Genevie Ann M.D.   On: 12/29/2019 16:34     Assessment Pelvic cystic mass with benign characteristics needs f/u Vaginal discharge blood stained reported as well as rectal bleeding   Plan Orders Placed This Encounter  Procedures  . MM Digital Screening    Standing Status:   Future    Standing Expiration Date:   12/31/2020    Order Specific Question:   Reason for Exam (SYMPTOM  OR DIAGNOSIS REQUIRED)    Answer:   screen    Order Specific Question:   Is the patient pregnant?  Answer:   No    Order Specific Question:   Preferred imaging location?    Answer:   [facility]  . US PELVIC COMPLETE WITH TRANSVAGINAL    Standing Status:   Future    Standing Expiration Date:   01/01/2021    Order Specific Question:   Reason for Exam (SYMPTOM   OR DIAGNOSIS REQUIRED)    Answer:   left pelvic mass    Order Specific Question:   Preferred imaging location?    Answer:   [facility]  . CA 125   RTC to discuss further management. If benign findings and asymptomatic may not need surgery    Emeterio Reeve 01/02/2020, 11:49 AM

## 2020-01-03 LAB — CA 125: Cancer Antigen (CA) 125: 6.9 U/mL (ref 0.0–38.1)

## 2020-01-05 ENCOUNTER — Other Ambulatory Visit (HOSPITAL_COMMUNITY): Payer: Self-pay | Admitting: Psychiatry

## 2020-01-05 ENCOUNTER — Telehealth (HOSPITAL_COMMUNITY): Payer: Self-pay | Admitting: *Deleted

## 2020-01-05 MED ORDER — ESCITALOPRAM OXALATE 20 MG PO TABS: 40.0000 mg | ORAL_TABLET | Freq: Every day | ORAL | 2 refills | Status: DC

## 2020-01-05 NOTE — Telephone Encounter (Signed)
I did not remember she was on 40 mg (disappeared from her medication list) so I ordered 20 mg "by mistake". It has been corrected.

## 2020-01-05 NOTE — Telephone Encounter (Signed)
Pt called #1 c/o that pharmacy only gave her #10 of the Lexapro sent in, and picked up per pharmacy, on the 15th. #2 that she should be on 40mg  of the Lexapro instead of 20mg  and is very upset that the dose was changed. After Probation officer spoke with pt and pharmacy, writer called pt back and pt did say that bottle does say "partial' fill, despite Probation officer asking that during first t/c. However no worries if you want to change to 40mg  again. Please review. Thanks.

## 2020-01-06 LAB — CERVICOVAGINAL ANCILLARY ONLY
Bacterial Vaginitis (gardnerella): NEGATIVE
Chlamydia: NEGATIVE
Comment: NEGATIVE
Comment: NEGATIVE
Comment: NEGATIVE
Comment: NORMAL
Neisseria Gonorrhea: NEGATIVE
Trichomonas: NEGATIVE

## 2020-01-07 ENCOUNTER — Ambulatory Visit (INDEPENDENT_AMBULATORY_CARE_PROVIDER_SITE_OTHER): Payer: Medicare Other | Admitting: Psychiatry

## 2020-01-07 ENCOUNTER — Encounter (HOSPITAL_COMMUNITY): Payer: Self-pay | Admitting: Psychiatry

## 2020-01-07 ENCOUNTER — Other Ambulatory Visit: Payer: Self-pay

## 2020-01-07 DIAGNOSIS — F250 Schizoaffective disorder, bipolar type: Secondary | ICD-10-CM
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
## 2020-01-12 ENCOUNTER — Ambulatory Visit: Payer: Self-pay | Admitting: *Deleted

## 2020-01-12 NOTE — Telephone Encounter (Signed)
Patient calls with pain (unsure of location) stated her legs give out and she falls. This has been occuring awhile due to a curvature in her disk I think is what she was trying to explain. Offered an appointment she refused.  [name] requesting increase in or stronger pain medication for this problem. Stated one pill is not helping at all at this time. Explained the office is closed and I would get this information to the provider and she would receive a call tomorrow. Also, she could call 911 if she is in severe pain or needs help immediately. Patient said she would not go to the hospital. Is requesting higher dose or extra pain medication. Routing to clinic for callback to patient tomorrow with further advice from provider.   No protocol found within guidelines-pain management.

## 2020-01-13 ENCOUNTER — Telehealth (INDEPENDENT_AMBULATORY_CARE_PROVIDER_SITE_OTHER): Payer: Medicare Other | Admitting: Psychiatry

## 2020-01-13 ENCOUNTER — Ambulatory Visit: Payer: Self-pay | Admitting: *Deleted

## 2020-01-13 ENCOUNTER — Ambulatory Visit: Payer: Self-pay

## 2020-01-13 ENCOUNTER — Other Ambulatory Visit: Payer: Self-pay

## 2020-01-13 DIAGNOSIS — F4310 Post-traumatic stress disorder, unspecified: Secondary | ICD-10-CM | POA: Diagnosis not present

## 2020-01-13 DIAGNOSIS — F250 Schizoaffective disorder, bipolar type: Secondary | ICD-10-CM | POA: Diagnosis not present

## 2020-01-13 MED ORDER — LIDOCAINE 5 % EX PTCH: 1.0000 | MEDICATED_PATCH | CUTANEOUS | 0 refills | Status: DC

## 2020-01-13 MED ORDER — TRAZODONE HCL 100 MG PO TABS: ORAL_TABLET | ORAL | 2 refills | Status: DC

## 2020-01-13 MED ORDER — BREXPIPRAZOLE 1 MG PO TABS: 1.0000 mg | ORAL_TABLET | Freq: Two times a day (BID) | ORAL | 0 refills | Status: DC

## 2020-01-13 NOTE — Telephone Encounter (Signed)
Prescription for Lidoderm patch sent to the pharmacy

## 2020-01-13 NOTE — Addendum Note (Signed)
Addended by: [name] on: 01/13/2020 01:48 PM   Modules accepted: Orders

## 2020-01-13 NOTE — Telephone Encounter (Signed)
Prescription for 180 tablets of colchicine was sent to her pharmacy on 01/01/2020

## 2020-01-13 NOTE — Telephone Encounter (Signed)
Called pt made aware of RX sent to pharmacy 

## 2020-01-13 NOTE — Telephone Encounter (Signed)
Pt made aware to pick up meds from pharmmacy

## 2020-01-13 NOTE — Telephone Encounter (Signed)
Copied from [name] [id number]. Topic: General - Other >> Jan 13, 2020 10:54 AM Tiffany [name] wrote: Reason for CRM: Pt calling again regarding the pain she is experiencing. See Nurse Triage note. Please advise.

## 2020-01-13 NOTE — Telephone Encounter (Signed)
Pt. Called, crying. States she is in pain with her back. Has not slept for "3 nights." States her "medication is not helping." States when "I was in the hospital, they kept some of my medication." Asking medication be sent to her pharmacy. Offered appointment, declines.  States she is "out of my gout medicine." Please advise pt.   Answer Assessment - Initial Assessment Questions 1. ONSET: "When did the pain begin?"      May 2. LOCATION: "Where does it hurt?" (upper, mid or lower back)     Low 3. SEVERITY: "How bad is the pain?"  (e.g., Scale 1-10; mild, moderate, or severe)   - MILD (1-3): doesn't interfere with normal activities    - MODERATE (4-7): interferes with normal activities or awakens from sleep    - SEVERE (8-10): excruciating pain, unable to do any normal activities      10 4. PATTERN: "Is the pain constant?" (e.g., yes, no; constant, intermittent)      Constant 5. RADIATION: "Does the pain shoot into your legs or elsewhere?"     No 6. CAUSE:  "What do you think is causing the back pain?"      Disc problem 7. BACK OVERUSE:  "Any recent lifting of heavy objects, strenuous work or exercise?"     No 8. MEDICATIONS: "What have you taken so far for the pain?" (e.g., nothing, acetaminophen, NSAIDS)     See med list 9. NEUROLOGIC SYMPTOMS: "Do you have any weakness, numbness, or problems with bowel/bladder control?"     Weakness 10. OTHER SYMPTOMS: "Do you have any other symptoms?" (e.g., fever, abdominal pain, burning with urination, blood in urine)       No 11. PREGNANCY: "Is there any chance you are pregnant?" (e.g., yes, no; LMP)       No  Protocols used: BACK PAIN-A-AH

## 2020-01-13 NOTE — Progress Notes (Signed)
BH MD/PA/NP OP Progress Note  01/13/2020 11:19 AM Tiffany [name]  MRN:  [id number] Interview was conducted by phone and I verified that I was speaking with the correct person using two identifiers. I discussed the limitations of evaluation and management by telemedicine and  the availability of in person appointments. Patient expressed understanding and agreed to proceed. Participants in the visit: patient (location - home); physician (location - home office).  Chief Complaint: Paranoia.  HPI: 59yo divorced AAF with schizoaffective disorder bipolar type(vs paranoid schizophrenia),chronicPTSD,remote hx ofcocaineaddiction (clean for4years)and bingeeating disorder.She tried ziprazidone but developedQTC prolongation on itso it was changed toSaphriswhich she believes is a best medication she has ever been on. [name] also on escitalopram andwas onVyvanse for binge eating disorder. Tiffany [name] used to weigh over 300 lbs and since starting Vyvanse her binge eating is well controlled and she was able to lose a lot of weight). She continues to take diazepam 10 mgtidand trazodone for sleep (100 mg plus 50 mg in am "for nerves").She has a strained relationship with her family - mother in particular, but also son. [name] not comfortable living with her mother but has an independent apartment as of last month. She denies feeling particularly depressed but remains anxious and feels that generic asenapine which she got instead of Saphris is less effective in controling paranoia/irritability/anxiety. Shesound more paranoid again - talking about her son getting people to come to her apartment, steal her staff.  She changed locks but it is not not helping and intends to get restraining order against her family.   Visit Diagnosis:    ICD-10-CM   1. Schizoaffective disorder, bipolar type ([place])  F25.0   2. PTSD (post-traumatic stress disorder)  F43.10     Past Psychiatric History: Please see intake  H&P.  Past Medical History:  Past Medical History:  Diagnosis Date  . Agitation 11/22/2017  . Anoxic brain injury ([place]) 09/08/2016   C. Arrest due to respiratory failure and COPD exacerbation  . Anxiety   . Arthritis    "all over" (04/10/2016)  . Asthma 10/18/2010  . Binge eating disorder   . Cardiac arrest ([place]) 09/08/2016   PEA  . Carotid artery stenosis    1-39% bilateral by dopplers 11/2016  . Chronic diastolic (congestive) heart failure ([place])   . Chronic kidney disease    "I see a kidney dr." (04/10/2016)  . Chronic pain syndrome 06/18/2012  . Chronic post-traumatic stress disorder (PTSD) 05/27/2018  . Chronic respiratory failure with hypoxia and hypercapnia (HCC) 06/22/2015   TRILOGY Vent >AVAPA-ES., Vt target 200-400, Max P 30 , PS max 20 , PS min 6-10 , E Max 6, E Min 4, Rate Auto AVAPS Rate 2 (titrate for pt comfort) , bleed O2 at 5l/m continuous flow .   [name] CKD (chronic kidney disease) stage 3, GFR 30-59 ml/min (HCC) 12/15/2016  . Closed displaced fracture of fifth metacarpal bone 03/21/2018  . Cocaine use disorder, severe, in sustained remission ([place]) 12/17/2015  . Complication of anesthesia    decreased bp, decreased heart rate  . COPD (chronic obstructive pulmonary disease) ([place]) 07/08/2014  . Depression   . Diabetic neuropathy ([place]) 04/24/2011  . Difficulty with speech 01/24/2018  . Disorder of nervous system   . Drug abuse ([place]) 11/21/2017  . Dyslipidemia 04/24/2011  . Elevated troponin 04/28/2012  . Emphysema   . Encephalopathy 11/21/2017  . Essential hypertension 03/22/2016  . Fibula fracture 07/10/2016  . Frequent falls 10/11/2017  . GERD (gastroesophageal reflux disease)   .  Gout 04/11/2017  . Heart attack ([place]) 1980s  . History of blood transfusion 1994   "couldn't stop bleeding from my period"  . History of drug abuse in remission ([place]) 11/28/2015   Quit in 2017  . Hyperlipidemia LDL goal <70   . Incontinence   . Manic depression ([place])   . Morbid obesity ([place]) 10/18/2010   . Obstructive sleep apnea 10/18/2010  . On home oxygen therapy    "6L; 24/7" (04/10/2016)  . OSA on CPAP    "wear mask sometimes" (04/10/2016)  . Paranoid ([place])    "sometimes; I'm on RX for it" (04/10/2016)  . Prolonged Q-T interval on ECG   . Rectal bleeding 12/31/2015  . Right carotid bruit 11/09/2016  . Schizoaffective disorder, bipolar type ([place]) 04/05/2018  . Seasonal allergies   . Seborrheic keratoses 12/31/2013  . Seizures ([place])    "don't know what kind; last one was ~ 1 yr ago" (04/10/2016)  . Stroke [facility]) 1980s   denies residual on 04/10/2016  . Thrush 09/19/2013  . Type 2 diabetes mellitus ([place]) 10/18/2010    Past Surgical History:  Procedure Laterality Date  . CESAREAN SECTION  1997  . HERNIA REPAIR    . IR CHOLANGIOGRAM EXISTING TUBE  07/20/2016  . IR PERC CHOLECYSTOSTOMY  05/10/2016  . IR RADIOLOGIST EVAL & MGMT  06/08/2016  . IR RADIOLOGIST EVAL & MGMT  06/29/2016  . IR SINUS/FIST TUBE CHK-NON GI  07/12/2016  . RIGHT/LEFT HEART CATH AND CORONARY ANGIOGRAPHY N/A 06/19/2017   Procedure: RIGHT/LEFT HEART CATH AND CORONARY ANGIOGRAPHY;  Surgeon: Jolaine Artist, MD;  Location: [place] [facility];  Service: Cardiovascular;  Laterality: N/A;  . TIBIA IM NAIL INSERTION Right 07/12/2016   Procedure: INTRAMEDULLARY (IM) NAIL RIGHT TIBIA;  Surgeon: Leandrew Koyanagi, MD;  Location: [facility];  Service: Orthopedics;  Laterality: Right;  . UMBILICAL HERNIA REPAIR  ~ 1963   "that's why I don't have a belly button"  . VAGINAL HYSTERECTOMY      Family Psychiatric History: Reviewed.  Family History:  Family History  Problem Relation Age of Onset  . Cancer Father        prostate  . Cancer Mother        lung  . Depression Mother   . Depression Sister   . Anxiety disorder Sister   . Schizophrenia Sister   . Bipolar disorder Sister   . Depression Sister   . Depression Brother   . Heart failure Other        cousin    Social History:  Social History   Socioeconomic History  . Marital  status: Widowed    Spouse name: Not on file  . Number of children: 3  . Years of education: Not on file  . Highest education level: Not on file  Occupational History  . Occupation: disabled    Comment: factory production  Tobacco Use  . Smoking status: Former Smoker    Packs/day: 1.50    Years: 38.00    Pack years: 57.00    Types: Cigarettes    Start date: 03/13/1977    Quit date: 04/10/2016    Years since quitting: 3.7  . Smokeless tobacco: Never Used  Vaping Use  . Vaping Use: Never used  Substance and Sexual Activity  . Alcohol use: No    Alcohol/week: 0.0 standard drinks  . Drug use: No    Types: Cocaine    Comment: 04/10/2016 "last used cocaine back in November 2017"  .  Sexual activity: Not Currently    Birth control/protection: Surgical  Other Topics Concern  . Not on file  Social History Narrative   Has 1 son, [name]   Lives with son and his boyfriend   Her house has ramps and handrails should she ever needs them.    Her mother lives down the street from her and is a good support person in addition to her son.   She drives herself, has private transportation.    Cocaine free since 02/24/16, smoke free since 04/10/16   Social Determinants of Health   Financial Resource Strain: Not on file  Food Insecurity: Not on file  Transportation Needs: Not on file  Physical Activity: Not on file  Stress: Not on file  Social Connections: Not on file    Allergies:  Allergies  Allergen Reactions  . Hydrocodone Shortness Of Breath  . Hydrocodone-Acetaminophen Shortness Of Breath  . Hydroxyzine Anaphylaxis and Shortness Of Breath  . Latuda [Lurasidone Hcl] Anaphylaxis  . Lurasidone Anaphylaxis  . Magnesium-Containing Compounds Anaphylaxis  . Prednisone Anaphylaxis, Swelling and Other (See Comments)    Tongue swelling  . Tramadol Anaphylaxis and Swelling  . Codeine Nausea And Vomiting  . Other Rash  . Sulfa Antibiotics Itching  . Tape Rash    Metabolic Disorder  Labs: Lab Results  Component Value Date   HGBA1C 5.6 11/08/2018   MPG 125.5 01/07/2017   MPG 111.15 11/03/2016   No results found for: PROLACTIN Lab Results  Component Value Date   CHOL 127 08/05/2018   TRIG 53 08/05/2018   HDL 62 08/05/2018   CHOLHDL 2.0 08/05/2018   VLDL 38 01/07/2017   LDLCALC 54 08/05/2018   LDLCALC 56 01/07/2017   Lab Results  Component Value Date   TSH 1.233 10/27/2019   TSH 0.563 03/15/2016    Therapeutic Level Labs: No results found for: LITHIUM Lab Results  Component Value Date   VALPROATE 20 (L) 09/09/2016   VALPROATE 46 (L) 07/10/2016   No components found for:  CBMZ  Current Medications: Current Outpatient Medications  Medication Sig Dispense Refill  . brexpiprazole (REXULTI) 1 MG TABS tablet Take 1 tablet (1 mg total) by mouth in the morning and at bedtime. 60 tablet 0  . albuterol (PROVENTIL) (2.5 MG/3ML) 0.083% nebulizer solution Take 3 mLs (2.5 mg total) by nebulization every 6 (six) hours as needed for wheezing or shortness of breath. 75 mL 5  . albuterol (VENTOLIN HFA) 108 (90 Base) MCG/ACT inhaler Inhale 2 puffs into the lungs every 6 (six) hours as needed for wheezing or shortness of breath. 18 g 2  . atorvastatin (LIPITOR) 80 MG tablet Take 1 tablet (80 mg total) by mouth daily. 90 tablet 1  . Blood Pressure Monitoring (BLOOD PRESSURE CUFF) MISC 1 Units by Does not apply route daily. 1 each 0  . colchicine 0.6 MG tablet Take 1 tablet (0.6 mg total) by mouth 2 (two) times daily. 180 tablet 0  . diazepam (VALIUM) 10 MG tablet Take 1 tablet (10 mg total) by mouth every 8 (eight) hours as needed for anxiety. 90 tablet 1  . diclofenac (VOLTAREN) 75 MG EC tablet Take 1 tablet (75 mg total) by mouth 2 (two) times daily. 90 tablet 1  . diclofenac Sodium (VOLTAREN) 1 % GEL Apply 2 g topically 4 (four) times daily. 350 g 0  . Elastic Bandages & Supports (MEDICAL COMPRESSION STOCKINGS) MISC 1 Units by Does not apply route daily. 1 each 1  .  escitalopram (  LEXAPRO) 20 MG tablet Take 2 tablets (40 mg total) by mouth daily. 60 tablet 2  . febuxostat (ULORIC) 40 MG tablet Take 1 tablet (40 mg total) by mouth daily. 90 tablet 0  . ferrous sulfate 325 (65 FE) MG tablet Take 1 tablet (325 mg total) by mouth daily with breakfast. 180 tablet 0  . Fluticasone-Umeclidin-Vilant (TRELEGY ELLIPTA) 100-62.5-25 MCG/INH AEPB Inhale 1 puff into the lungs daily. 60 each 11  . gabapentin (NEURONTIN) 600 MG tablet Take 1 tablet (600 mg total) by mouth 2 (two) times daily. 180 tablet 1  . lisdexamfetamine (VYVANSE) 70 MG capsule Take 1 capsule (70 mg total) by mouth daily before breakfast. 30 capsule 0  . montelukast (SINGULAIR) 10 MG tablet Take 1 tablet (10 mg total) by mouth at bedtime. 30 tablet 11  . Multiple Vitamin (MULTIVITAMIN WITH MINERALS) TABS tablet Take 1 tablet by mouth daily.    [name] omeprazole (PRILOSEC) 40 MG capsule Take 1 capsule (40 mg total) by mouth daily. 90 capsule 1  . oxybutynin (DITROPAN) 5 MG tablet Take 1 tablet (5 mg total) by mouth 2 (two) times daily. 180 tablet 1  . potassium chloride SA (KLOR-CON) 20 MEQ tablet Take 2 tablets (40 mEq total) by mouth daily. 360 tablet 1  . prazosin (MINIPRESS) 5 MG capsule TAKE 1 CAPSULE BY MOUTH TWICE DAILY 180 capsule 0  . saccharomyces boulardii (FLORASTOR) 250 MG capsule Take 1 capsule (250 mg total) by mouth 2 (two) times daily. 20 capsule 0  . tiZANidine (ZANAFLEX) 4 MG tablet Take 1 tablet (4 mg total) by mouth every 8 (eight) hours as needed for muscle spasms. 60 tablet 1  . traZODone (DESYREL) 100 MG tablet Take 0.5 tablet (50 mg) in am and one tablet (100 mg) at bedtime 45 tablet 2   No current facility-administered medications for this visit.     Psychiatric Specialty Exam: Review of Systems  Musculoskeletal: Positive for back pain.  Psychiatric/Behavioral: The patient is nervous/anxious.   All other systems reviewed and are negative.   There were no vitals taken for this  visit.There is no height or weight on file to calculate BMI.  General Appearance: NA  Eye Contact:  NA  Speech:  Clear and Coherent and Normal Rate  Volume:  Normal  Mood:  Anxious and Depressed  Affect:  NA  Thought Process:  Descriptions of Associations: Circumstantial  Orientation:  Full (Time, Place, and Person)  Thought Content: Paranoid Ideation   Suicidal Thoughts:  No  Homicidal Thoughts:  No  Memory:  Immediate;   Fair Recent;   Fair Remote;   Fair  Judgement:  Fair  Insight:  Shallow  Psychomotor Activity:  NA  Concentration:  Concentration: Fair  Recall:  [employer] of Knowledge: Fair  Language: Good  Akathisia:  Negative  Handed:  Right  AIMS (if indicated): not done  Assets:  Communication Skills Desire for Improvement Financial Resources/Insurance Housing  ADL's:  Intact  Cognition: WNL  Sleep:  Fair   Screenings: AUDIT   Flowsheet Row Admission (Discharged) from 12/01/2015 in [place] [facility]  Alcohol Use Disorder Identification Test Final Score (AUDIT) 0    GAD-7   Flowsheet Row Office Visit from 03/21/2018 in [facility]  Total GAD-7 Score 4    PHQ2-9   [place] Visit from confidential encounter on 12/11/2018 Office Visit from confidential encounter on 12/10/2018 Office Visit from confidential encounter on 11/18/2018 Office Visit from confidential encounter on  11/08/2018 Telemedicine from confidential encounter on 10/25/2018  PHQ-2 Total Score 0 4 2 1 4   PHQ-9 Total Score -- 10 8 1 16        Assessment and Plan: 59yo divorced AAF with schizoaffective disorder bipolar type(vs paranoid schizophrenia),chronicPTSD,remote hx ofcocaineaddiction (clean for4years)and bingeeating disorder.She tried ziprazidone but developedQTC prolongation on itso it was changed toSaphriswhich she believes is a best medication she has ever been on. [name] also on escitalopram andwas onVyvanse for binge  eating disorder. [place] used to weigh over 300 lbs and since starting Vyvanse her binge eating is well controlled and she was able to lose a lot of weight). She continues to take diazepam 10 mgtidand trazodone for sleep (100 mg plus 50 mg in am "for nerves").She has a strained relationship with her family - mother in particular, but also son. [name] not comfortable living with her mother but has an independent apartment as of this month. She denies feeling particularly depressed but remains anxious and feels that generic asenapine which she got instead of Saphris is less effective in controling paranoia/irritability/anxiety. Shesound more paranoid again - talking about her son getting people to come to her apartment, steal her staff.  She changed locks but it is not not helping.  Dx: Schizoaffective disorder bipolar type; PTSD chronic; Binge eating disorder  Plan: Continue trazodone100 mg prn sleep plus 50 mg during the day for anxiety,Vyvanse70 mg,prazosin 5 mg bid, gabapentin 600 bid and diazepam 10 mg tid, escitalopram 40 mg daily. We will dc asenapine and try brexpiprazole  2 mg total dose but given as 1 mg bid as she insists on taking any ,edication "for paranoia"at least twice daily". She also is asking (again) for a letter that specifies she is under our care and compliant with her medications/appointments. Unclear what she needs this letter for. Next appointment in3weeks. The plan was discussed with patient who had an opportunity to ask questions and these were all answered.I spend51minutes in phone consultation with the patient.   Stephanie Acre, MD 01/13/2020, 11:19 AM

## 2020-01-13 NOTE — Telephone Encounter (Signed)
C/o severe back pain and now falling multiple times a day. No injury reported. Patient reports she is unable to stand straight without pain in back and legs. C/o unable to hold objects in hands without dropping. Patient requesting different pain medication than zanaflex. Requesting to have Aide in home longer than 3 hours a day due to unable to physically move without falling. Requesting a wheelchair for mobility, and therapy. Patient reports her balance is "off" and she is unable to stand up without assistance. Patient reports she no longer has family support for physical care for her. Care advise given. Patient verbalized understanding of care advise and reports she will call back but will not go to ED and sit for hours. Attempted to schedule appt but patient reports PCP should be aware of her needs.   Reason for Disposition . [1] SEVERE back pain (e.g., excruciating, unable to do any normal activities) AND [2] not improved 2 hours after pain medicine  Answer Assessment - Initial Assessment Questions 1. ONSET: "When did the pain begin?"      A while ago  2. LOCATION: "Where does it hurt?" (upper, mid or lower back)     From the top of spine to the bottom on spine 3. SEVERITY: "How bad is the pain?"  (e.g., Scale 1-10; mild, moderate, or severe)   - MILD (1-3): doesn't interfere with normal activities    - MODERATE (4-7): interferes with normal activities or awakens from sleep    - SEVERE (8-10): excruciating pain, unable to do any normal activities      Severe  4. PATTERN: "Is the pain constant?" (e.g., yes, no; constant, intermittent)      Constant  5. RADIATION: "Does the pain shoot into your legs or elsewhere?"     Na  6. CAUSE:  "What do you think is causing the back pain?"      Mass on spine  7. BACK OVERUSE:  "Any recent lifting of heavy objects, strenuous work or exercise?"     Can not hold objects in hands without dropping  8. MEDICATIONS: "What have you taken so far for the pain?"  (e.g., nothing, acetaminophen, NSAIDS)     zanaflex  9. NEUROLOGIC SYMPTOMS: "Do you have any weakness, numbness, or problems with bowel/bladder control?"     Weakness , numbness in bilateral hands, fingers disfigured due to arthritis 10. OTHER SYMPTOMS: "Do you have any other symptoms?" (e.g., fever, abdominal pain, burning with urination, blood in urine)       Pain in spine 11. PREGNANCY: "Is there any chance you are pregnant?" (e.g., yes, no; LMP)       na  Protocols used: BACK PAIN-A-AH

## 2020-01-14 ENCOUNTER — Ambulatory Visit: Payer: Self-pay

## 2020-01-14 NOTE — Telephone Encounter (Signed)
Pt. Calling again asking for pain medication for her back pain. Crying. Has weakness in legs and reports she falls "a lot." Please advise pt.  Answer Assessment - Initial Assessment Questions 1. ONSET: "When did the pain begin?"      Months 2. LOCATION: "Where does it hurt?" (upper, mid or lower back)     Low back pain 3. SEVERITY: "How bad is the pain?"  (e.g., Scale 1-10; mild, moderate, or severe)   - MILD (1-3): doesn't interfere with normal activities    - MODERATE (4-7): interferes with normal activities or awakens from sleep    - SEVERE (8-10): excruciating pain, unable to do any normal activities      Severe 4. PATTERN: "Is the pain constant?" (e.g., yes, no; constant, intermittent)      Constant 5. RADIATION: "Does the pain shoot into your legs or elsewhere?"     Legs 6. CAUSE:  "What do you think is causing the back pain?"      Disc problem 7. BACK OVERUSE:  "Any recent lifting of heavy objects, strenuous work or exercise?"     No 8. MEDICATIONS: "What have you taken so far for the pain?" (e.g., nothing, acetaminophen, NSAIDS)     No 9. NEUROLOGIC SYMPTOMS: "Do you have any weakness, numbness, or problems with bowel/bladder control?"     Weakness 10. OTHER SYMPTOMS: "Do you have any other symptoms?" (e.g., fever, abdominal pain, burning with urination, blood in urine)       No 11. PREGNANCY: "Is there any chance you are pregnant?" (e.g., yes, no; LMP)       No  Protocols used: BACK PAIN-A-AH

## 2020-01-14 NOTE — Telephone Encounter (Signed)
Duplicate message. Also sent to Dr Margarita Rana and this was addressed

## 2020-01-15 ENCOUNTER — Telehealth: Payer: Self-pay | Admitting: Family Medicine

## 2020-01-15 ENCOUNTER — Telehealth (HOSPITAL_COMMUNITY): Payer: Self-pay | Admitting: *Deleted

## 2020-01-15 DIAGNOSIS — G8929 Other chronic pain: Secondary | ICD-10-CM

## 2020-01-15 NOTE — Telephone Encounter (Signed)
PA for Rexulti completed and faxed to [facility]. Awaiting determination.

## 2020-01-15 NOTE — Telephone Encounter (Signed)
Thank you - they may balk at bid dosing but she insisted that she would rather take a lower dose twice daily than higher once a day. [name] rather difficult to convince otherwise.

## 2020-01-15 NOTE — Telephone Encounter (Signed)
Yes I did note that on PA

## 2020-01-15 NOTE — Telephone Encounter (Signed)
Sh is under the care of OBGYN for her pelvic mass and had a visit with them on 01/02/20. I have referred her to pain management for her chronic pain since the Tizanidine, Diclofenac and Gabapentin have been ineffective.

## 2020-01-15 NOTE — Telephone Encounter (Signed)
Copied from [place] [id number]. Topic: Quick Communication - See Telephone Encounter >> Jan 14, 2020  5:52 PM Loma [place] wrote: CRM for notification. See Telephone encounter for: 01/14/20.Pt is awaiting a NT Note  response for help with pain meds from today, 12/29  Pt states someone was supposed to call her BS [phone] Pt in tears, States pain patches do not work.

## 2020-01-15 NOTE — Telephone Encounter (Addendum)
Pt is calling back she is in a lot of pain and crying. Pt also has cyst in her uterus

## 2020-01-15 NOTE — Telephone Encounter (Signed)
Pt states that pain patches are not working for her pain she is requesting something else.

## 2020-01-20 ENCOUNTER — Ambulatory Visit: Payer: Self-pay

## 2020-01-20 ENCOUNTER — Telehealth: Payer: Self-pay | Admitting: Family Medicine

## 2020-01-20 NOTE — Telephone Encounter (Signed)
Patient called crying and says she's in pain and needs Dr. Margarita Rana to send in pain medication. She says that the Tizanidine that was given on 01/01/20 caused her heart to hurt, so she stopped taking it and the pain went away. She says that she received a letter from [facility] saying that the form for her to have [facility] was not filled out with the date of her last office visit and that will need to be done and sent back in so she can get an aid. She says she needs more than 3 hours of help at home because she is constantly falling due to the pain and her back. I called the [facility] to see if there are any telephone visit for the patient to discuss this with Dr. Margarita Rana, but none available. I advised the patient no earlier appointments with Dr. Margarita Rana and she has one scheduled already on 01/29/20, pain management visit on 01/27/20. Patient asked if Dr. Margarita Rana would call in something for pain until she can go to the appointment on 01/27/19 and go ahead an fill out the form correctly. I advised I will send this over to the office for Dr. Margarita Rana and someone will call back with her recommendation.    Reason for Disposition . Health Information question, no triage required and triager able to answer question  Answer Assessment - Initial Assessment Questions 1. REASON FOR CALL or QUESTION: "What is your reason for calling today?" or "How can I best help you?" or "What question do you have that I can help answer?"     I need pain medicine, an aid at home and when is my pain management appointment  Protocols used: [place]

## 2020-01-20 NOTE — Telephone Encounter (Signed)
Form will be re faxed to company as requested.

## 2020-01-20 NOTE — Telephone Encounter (Signed)
Pt called to get her information for pain management / I advised Pt of appt and she asked if Dr. Margarita Rana would send something in for her pain until her appt/ she stated she is in severe pain and shouldn't be left in pain/ Pt also stated she has changed her home health aid service to [facility] and they advised pt that the provider has not given them the information for when the pt last seen her PCP / Pt also stated she needs more hrs for the home health aid/ please advise asap

## 2020-01-27 ENCOUNTER — Encounter (HOSPITAL_COMMUNITY): Payer: Self-pay | Admitting: Psychiatry

## 2020-01-27 ENCOUNTER — Other Ambulatory Visit: Payer: Self-pay

## 2020-01-27 ENCOUNTER — Ambulatory Visit (INDEPENDENT_AMBULATORY_CARE_PROVIDER_SITE_OTHER): Payer: 59 | Admitting: Psychiatry

## 2020-01-27 ENCOUNTER — Telehealth: Payer: Self-pay | Admitting: Physical Medicine and Rehabilitation

## 2020-01-27 ENCOUNTER — Ambulatory Visit: Payer: Medicare Other | Admitting: Physical Medicine and Rehabilitation

## 2020-01-27 DIAGNOSIS — F250 Schizoaffective disorder, bipolar type: Secondary | ICD-10-CM

## 2020-01-27 DIAGNOSIS — F4310 Post-traumatic stress disorder, unspecified: Secondary | ICD-10-CM

## 2020-01-27 NOTE — Telephone Encounter (Signed)
Called pt back and resch 2/2

## 2020-01-27 NOTE — Progress Notes (Signed)
Virtual Visit via Telephone Note  I connected with Tiffany [name] on 01/27/20 at  4:30 PM EST by telephone and verified that I am speaking with the correct person using two identifiers.  Location: Patient: Patient Home Provider: Home Office  I discussed the limitations of evaluation and management by telemedicine and the availability of in person appointments. The patient expressed understanding and agreed to proceed.  History of Present Illness: Schizoeffective DO Bipolar Type and Chronic PTSD  Treatment Plan Goals: Goal 1: Client would like to create and maintain a more stable living environment in order to better manage mental health care and routine with medication management and therapy participation to reduce anxiety and depression.  Goal 2: Client would like to process grief/loss issues and trauma experiences to heal from past to move forward in building healthy relationships with others.  Observations/Objective: Counselor met withClientfor individual therapy viatelephone. Counselor assessed MH symptoms and progress on treatment plan goals, with patient reportingthat she was "not too good" today, as she was experiencing increased familiam stressors and physical health pains. Clientpresents withmoderatedepression andmoderateanxiety.Clientdenied suicidal ideation or self-harm behaviors.  Goal 1: Client confirmed that she remains in her housing, however she is having challenges with finding an aid to support her daily living and functioning. Client admitted to filing a grievance on her last aid because she believed her stole from her. She later realized she overreacted and was experiencing paranoia. Client presented as sorrowful over her reaction. Counselor discussed cognitive distortions with Client to process cognitive coping skills to use in the future to prevent emotional reasoning. Client endorses taking medications as prescribed.  Goal 2: Counselor and Client began  to discuss trauma triggers experienced due to negative interactions with family. Client flashes back to previous traumatic experiences with family, causing her anxiety, paranoia and anger to guide her decision making and communication skills. Client states that she believes that "they do not want to see me succeed. Call was challenging to hear due to connection and Client talking low due to pain levels. Call ended due to being disconnected multiple times.  Assessment and Plan: Patientwill continue to keep the office updated on her current contact information.Patient will continue to follow recommendations of providers and implement skills learned in session.  Follow Up Instructions: Counselor to continue collaboration with providers for coordination of care.   The patient was advised to call back or seek an in-person evaluation if the symptoms worsen or if the condition fails to improve as anticipated.  I provided35minutes of non-face-to-face time during this encounter.    , LCSW 

## 2020-01-27 NOTE — Telephone Encounter (Signed)
Patient called. She needs to cxl and [facility] her appointment.

## 2020-01-28 ENCOUNTER — Ambulatory Visit: Payer: Medicaid Other

## 2020-01-29 ENCOUNTER — Ambulatory Visit: Payer: Medicaid Other | Attending: Family Medicine | Admitting: Family Medicine

## 2020-01-29 ENCOUNTER — Other Ambulatory Visit: Payer: Self-pay

## 2020-02-02 ENCOUNTER — Other Ambulatory Visit: Payer: Self-pay

## 2020-02-02 ENCOUNTER — Ambulatory Visit (HOSPITAL_COMMUNITY): Payer: Medicare Other | Admitting: Psychiatry

## 2020-02-03 ENCOUNTER — Ambulatory Visit: Payer: Medicare Other | Admitting: Obstetrics & Gynecology

## 2020-02-03 ENCOUNTER — Other Ambulatory Visit: Payer: Self-pay

## 2020-02-03 ENCOUNTER — Telehealth (HOSPITAL_COMMUNITY): Payer: Medicaid Other | Admitting: Psychiatry

## 2020-02-04 ENCOUNTER — Ambulatory Visit: Admission: RE | Admit: 2020-02-04 | Payer: Medicaid Other | Source: Ambulatory Visit

## 2020-02-04 ENCOUNTER — Other Ambulatory Visit: Payer: Self-pay

## 2020-02-04 ENCOUNTER — Telehealth (INDEPENDENT_AMBULATORY_CARE_PROVIDER_SITE_OTHER): Payer: 59 | Admitting: Psychiatry

## 2020-02-04 DIAGNOSIS — F250 Schizoaffective disorder, bipolar type: Secondary | ICD-10-CM

## 2020-02-04 DIAGNOSIS — F5081 Binge eating disorder: Secondary | ICD-10-CM | POA: Diagnosis not present

## 2020-02-04 DIAGNOSIS — F4310 Post-traumatic stress disorder, unspecified: Secondary | ICD-10-CM

## 2020-02-04 DIAGNOSIS — E1142 Type 2 diabetes mellitus with diabetic polyneuropathy: Secondary | ICD-10-CM

## 2020-02-04 MED ORDER — LISDEXAMFETAMINE DIMESYLATE 70 MG PO CAPS: 70.0000 mg | ORAL_CAPSULE | Freq: Every day | ORAL | 0 refills | Status: DC

## 2020-02-04 MED ORDER — GABAPENTIN 600 MG PO TABS: 600.0000 mg | ORAL_TABLET | Freq: Two times a day (BID) | ORAL | 1 refills | Status: DC

## 2020-02-04 MED ORDER — SAPHRIS 10 MG SL SUBL: 10.0000 mg | SUBLINGUAL_TABLET | Freq: Three times a day (TID) | SUBLINGUAL | 2 refills | Status: DC

## 2020-02-04 MED ORDER — DIAZEPAM 10 MG PO TABS: 10.0000 mg | ORAL_TABLET | Freq: Three times a day (TID) | ORAL | 1 refills | Status: DC | PRN

## 2020-02-04 NOTE — Progress Notes (Signed)
[place] MD/PA/NP OP Progress Note  02/04/2020 10:33 AM Tiffany [name]  MRN:  [id number] Interview was conducted by phone and I verified that I was speaking with the correct person using two identifiers. I discussed the limitations of evaluation and management by telemedicine and  the availability of in person appointments. Patient expressed understanding and agreed to proceed. Participants in the visit: patient (location - home); physician (location - home office).  Chief Complaint: Paranoia, anxiety, depression.  HPI: 60yo divorced AAF with schizoaffective disorder bipolar type(vs paranoid schizophrenia),chronicPTSD,remote hx ofcocaineaddiction (clean for4years)and bingeeating disorder.She tried ziprazidone but developedQTC prolongation on itso it was changed toSaphriswhich she believes is a best medication she has ever been on. [name] also on escitalopram andwas onVyvanse for binge eating disorder. Aris used to weigh over 300 lbs and since starting Vyvanse her binge eating is well controlled and she was able to lose a lot of weight). She continues to take diazepam 10 mgtidand trazodone for sleep (100 mg plus 50 mg in am "for nerves").She has a strained relationship with her family - mother in particular, but also son. [name] not comfortable living with her mother but has an independent apartmentas of this month. She denies feeling particularly depressed but remains anxious and feels that generic asenapine which she got instead of Saphris is less effective in controlling paranoia/irritability/anxiety.We have added brexpiprazole but she still is taking generic Saphris with minimal benefit. She likes her new apartment but feels lonely, scared there. [name] to some extent aware that these fears are irrational.    Visit Diagnosis:    ICD-10-CM   1. Schizoaffective disorder, bipolar type ([place])  F25.0   2. Diabetic polyneuropathy associated with type 2 diabetes mellitus (HCC)  E11.42  gabapentin (NEURONTIN) 600 MG tablet  3. PTSD (post-traumatic stress disorder)  F43.10   4. Binge eating disorder  F50.81     Past Psychiatric History: Please see intake H&P.  Past Medical History:  Past Medical History:  Diagnosis Date  . Agitation 11/22/2017  . Anoxic brain injury ([place]) 09/08/2016   C. Arrest due to respiratory failure and COPD exacerbation  . Anxiety   . Arthritis    "all over" (04/10/2016)  . Asthma 10/18/2010  . Binge eating disorder   . Cardiac arrest ([place]) 09/08/2016   PEA  . Carotid artery stenosis    1-39% bilateral by dopplers 11/2016  . Chronic diastolic (congestive) heart failure ([place])   . Chronic kidney disease    "I see a kidney dr." (04/10/2016)  . Chronic pain syndrome 06/18/2012  . Chronic post-traumatic stress disorder (PTSD) 05/27/2018  . Chronic respiratory failure with hypoxia and hypercapnia (HCC) 06/22/2015   TRILOGY Vent >AVAPA-ES., Vt target 200-400, Max P 30 , PS max 20 , PS min 6-10 , E Max 6, E Min 4, Rate Auto AVAPS Rate 2 (titrate for pt comfort) , bleed O2 at 5l/m continuous flow .   [name] CKD (chronic kidney disease) stage 3, GFR 30-59 ml/min (HCC) 12/15/2016  . Closed displaced fracture of fifth metacarpal bone 03/21/2018  . Cocaine use disorder, severe, in sustained remission ([place]) 12/17/2015  . Complication of anesthesia    decreased bp, decreased heart rate  . COPD (chronic obstructive pulmonary disease) ([place]) 07/08/2014  . Depression   . Diabetic neuropathy ([place]) 04/24/2011  . Difficulty with speech 01/24/2018  . Disorder of nervous system   . Drug abuse ([place]) 11/21/2017  . Dyslipidemia 04/24/2011  . Elevated troponin 04/28/2012  . Emphysema   .  Encephalopathy 11/21/2017  . Essential hypertension 03/22/2016  . Fibula fracture 07/10/2016  . Frequent falls 10/11/2017  . GERD (gastroesophageal reflux disease)   . Gout 04/11/2017  . Heart attack ([place]) 1980s  . History of blood transfusion 1994   "couldn't stop bleeding from my period"  . History  of drug abuse in remission ([place]) 11/28/2015   Quit in 2017  . Hyperlipidemia LDL goal <70   . Incontinence   . Manic depression ([place])   . Morbid obesity ([place]) 10/18/2010  . Obstructive sleep apnea 10/18/2010  . On home oxygen therapy    "6L; 24/7" (04/10/2016)  . OSA on CPAP    "wear mask sometimes" (04/10/2016)  . Paranoid ([place])    "sometimes; I'm on RX for it" (04/10/2016)  . Prolonged Q-T interval on ECG   . Rectal bleeding 12/31/2015  . Right carotid bruit 11/09/2016  . Schizoaffective disorder, bipolar type ([place]) 04/05/2018  . Seasonal allergies   . Seborrheic keratoses 12/31/2013  . Seizures ([place])    "don't know what kind; last one was ~ 1 yr ago" (04/10/2016)  . Stroke [facility]) 1980s   denies residual on 04/10/2016  . Thrush 09/19/2013  . Type 2 diabetes mellitus ([place]) 10/18/2010    Past Surgical History:  Procedure Laterality Date  . CESAREAN SECTION  1997  . HERNIA REPAIR    . IR CHOLANGIOGRAM EXISTING TUBE  07/20/2016  . IR PERC CHOLECYSTOSTOMY  05/10/2016  . IR RADIOLOGIST EVAL & MGMT  06/08/2016  . IR RADIOLOGIST EVAL & MGMT  06/29/2016  . IR SINUS/FIST TUBE CHK-NON GI  07/12/2016  . RIGHT/LEFT HEART CATH AND CORONARY ANGIOGRAPHY N/A 06/19/2017   Procedure: RIGHT/LEFT HEART CATH AND CORONARY ANGIOGRAPHY;  Surgeon: Jolaine Artist, MD;  Location: [facility];  Service: Cardiovascular;  Laterality: N/A;  . TIBIA IM NAIL INSERTION Right 07/12/2016   Procedure: INTRAMEDULLARY (IM) NAIL RIGHT TIBIA;  Surgeon: Leandrew Koyanagi, MD;  Location: [place];  Service: Orthopedics;  Laterality: Right;  . UMBILICAL HERNIA REPAIR  ~ 1963   "that's why I don't have a belly button"  . VAGINAL HYSTERECTOMY      Family Psychiatric History: Reviewed.  Family History:  Family History  Problem Relation Age of Onset  . Cancer Father        prostate  . Cancer Mother        lung  . Depression Mother   . Depression Sister   . Anxiety disorder Sister   . Schizophrenia Sister   . Bipolar  disorder Sister   . Depression Sister   . Depression Brother   . Heart failure Other        cousin    Social History:  Social History   Socioeconomic History  . Marital status: Widowed    Spouse name: Not on file  . Number of children: 3  . Years of education: Not on file  . Highest education level: Not on file  Occupational History  . Occupation: disabled    Comment: factory production  Tobacco Use  . Smoking status: Former Smoker    Packs/day: 1.50    Years: 38.00    Pack years: 57.00    Types: Cigarettes    Start date: 03/13/1977    Quit date: 04/10/2016    Years since quitting: 3.8  . Smokeless tobacco: Never Used  Vaping Use  . Vaping Use: Never used  Substance and Sexual Activity  . Alcohol use: No    Alcohol/week:  0.0 standard drinks  . Drug use: No    Types: Cocaine    Comment: 04/10/2016 "last used cocaine back in November 2017"  . Sexual activity: Not Currently    Birth control/protection: Surgical  Other Topics Concern  . Not on file  Social History Narrative   Has 1 son, [name]   Lives with son and his boyfriend   Her house has ramps and handrails should she ever needs them.    Her mother lives down the street from her and is a good support person in addition to her son.   She drives herself, has private transportation.    Cocaine free since 02/24/16, smoke free since 04/10/16   Social Determinants of Health   Financial Resource Strain: Not on file  Food Insecurity: Not on file  Transportation Needs: Not on file  Physical Activity: Not on file  Stress: Not on file  Social Connections: Not on file    Allergies:  Allergies  Allergen Reactions  . Hydrocodone Shortness Of Breath  . Hydrocodone-Acetaminophen Shortness Of Breath  . Hydroxyzine Anaphylaxis and Shortness Of Breath  . Latuda [Lurasidone Hcl] Anaphylaxis  . Lurasidone Anaphylaxis  . Magnesium-Containing Compounds Anaphylaxis  . Prednisone Anaphylaxis, Swelling and Other (See Comments)     Tongue swelling  . Tramadol Anaphylaxis and Swelling  . Codeine Nausea And Vomiting  . Other Rash  . Sulfa Antibiotics Itching  . Tape Rash    Metabolic Disorder Labs: Lab Results  Component Value Date   HGBA1C 5.6 11/08/2018   MPG 125.5 01/07/2017   MPG 111.15 11/03/2016   No results found for: PROLACTIN Lab Results  Component Value Date   CHOL 127 08/05/2018   TRIG 53 08/05/2018   HDL 62 08/05/2018   CHOLHDL 2.0 08/05/2018   VLDL 38 01/07/2017   LDLCALC 54 08/05/2018   LDLCALC 56 01/07/2017   Lab Results  Component Value Date   TSH 1.233 10/27/2019   TSH 0.563 03/15/2016    Therapeutic Level Labs: No results found for: LITHIUM Lab Results  Component Value Date   VALPROATE 20 (L) 09/09/2016   VALPROATE 46 (L) 07/10/2016   No components found for:  CBMZ  Current Medications: Current Outpatient Medications  Medication Sig Dispense Refill  . SAPHRIS 10 MG SUBL Place 1 tablet (10 mg total) under the tongue in the morning, at noon, and at bedtime. 90 tablet 2  . albuterol (PROVENTIL) (2.5 MG/3ML) 0.083% nebulizer solution Take 3 mLs (2.5 mg total) by nebulization every 6 (six) hours as needed for wheezing or shortness of breath. 75 mL 5  . albuterol (VENTOLIN HFA) 108 (90 Base) MCG/ACT inhaler Inhale 2 puffs into the lungs every 6 (six) hours as needed for wheezing or shortness of breath. 18 g 2  . atorvastatin (LIPITOR) 80 MG tablet Take 1 tablet (80 mg total) by mouth daily. 90 tablet 1  . Blood Pressure Monitoring (BLOOD PRESSURE CUFF) MISC 1 Units by Does not apply route daily. 1 each 0  . colchicine 0.6 MG tablet Take 1 tablet (0.6 mg total) by mouth 2 (two) times daily. 180 tablet 0  . [START ON 02/09/2020] diazepam (VALIUM) 10 MG tablet Take 1 tablet (10 mg total) by mouth 3 (three) times daily as needed for anxiety. 90 tablet 1  . diclofenac (VOLTAREN) 75 MG EC tablet Take 1 tablet (75 mg total) by mouth 2 (two) times daily. 90 tablet 1  . diclofenac Sodium  (VOLTAREN) 1 % GEL Apply 2 g topically  4 (four) times daily. 350 g 0  . Elastic Bandages & Supports (MEDICAL COMPRESSION STOCKINGS) MISC 1 Units by Does not apply route daily. 1 each 1  . escitalopram (LEXAPRO) 20 MG tablet Take 2 tablets (40 mg total) by mouth daily. 60 tablet 2  . febuxostat (ULORIC) 40 MG tablet Take 1 tablet (40 mg total) by mouth daily. 90 tablet 0  . ferrous sulfate 325 (65 FE) MG tablet Take 1 tablet (325 mg total) by mouth daily with breakfast. 180 tablet 0  . Fluticasone-Umeclidin-Vilant (TRELEGY ELLIPTA) 100-62.5-25 MCG/INH AEPB Inhale 1 puff into the lungs daily. 60 each 11  . gabapentin (NEURONTIN) 600 MG tablet Take 1 tablet (600 mg total) by mouth 2 (two) times daily. 180 tablet 1  . lidocaine (LIDODERM) 5 % Place 1 patch onto the skin daily. 30 patch 0  . lisdexamfetamine (VYVANSE) 70 MG capsule Take 1 capsule (70 mg total) by mouth daily before breakfast. 30 capsule 0  . montelukast (SINGULAIR) 10 MG tablet Take 1 tablet (10 mg total) by mouth at bedtime. 30 tablet 11  . Multiple Vitamin (MULTIVITAMIN WITH MINERALS) TABS tablet Take 1 tablet by mouth daily.    [name] omeprazole (PRILOSEC) 40 MG capsule Take 1 capsule (40 mg total) by mouth daily. 90 capsule 1  . oxybutynin (DITROPAN) 5 MG tablet Take 1 tablet (5 mg total) by mouth 2 (two) times daily. 180 tablet 1  . potassium chloride SA (KLOR-CON) 20 MEQ tablet Take 2 tablets (40 mEq total) by mouth daily. 360 tablet 1  . prazosin (MINIPRESS) 5 MG capsule TAKE 1 CAPSULE BY MOUTH TWICE DAILY 180 capsule 0  . saccharomyces boulardii (FLORASTOR) 250 MG capsule Take 1 capsule (250 mg total) by mouth 2 (two) times daily. 20 capsule 0  . tiZANidine (ZANAFLEX) 4 MG tablet Take 1 tablet (4 mg total) by mouth every 8 (eight) hours as needed for muscle spasms. 60 tablet 1  . traZODone (DESYREL) 100 MG tablet Take 0.5 tablet (50 mg) in am and one tablet (100 mg) at bedtime 45 tablet 2   No current facility-administered  medications for this visit.       Psychiatric Specialty Exam: Review of Systems  Musculoskeletal: Positive for gait problem and myalgias.  Psychiatric/Behavioral: The patient is nervous/anxious.   All other systems reviewed and are negative.   There were no vitals taken for this visit.There is no height or weight on file to calculate BMI.  General Appearance: NA  Eye Contact:  NA  Speech:  Clear and Coherent and Normal Rate, rambling  Volume:  Normal  Mood:  Anxious and Depressed  Affect:  NA  Thought Process:  Descriptions of Associations: Circumstantial  Orientation:  Full (Time, Place, and Person)  Thought Content: Paranoid Ideation   Suicidal Thoughts:  No  Homicidal Thoughts:  No  Memory:  Immediate;   Fair Recent;   Good Remote;   Good  Judgement:  Good  Insight:  Fair  Psychomotor Activity:  NA  Concentration:  Concentration: Fair  Recall:  [place] of Knowledge: Good  Language: Good  Akathisia:  Negative  Handed:  Right  AIMS (if indicated): not done  Assets:  Communication Skills Desire for Improvement Financial Resources/Insurance Housing  ADL's:  Intact  Cognition: WNL  Sleep:  Fair   Screenings: AUDIT   Flowsheet Row Admission (Discharged) from 12/01/2015 in [place]  Alcohol Use Disorder Identification Test Final Score (AUDIT) 0    GAD-7  [place] Office Visit from 03/21/2018 in [facility] for [facility]  Total GAD-7 Score 4    PHQ2-9   [place] Office Visit from confidential encounter on 12/11/2018 Office Visit from confidential encounter on 12/10/2018 Office Visit from confidential encounter on 11/18/2018 Office Visit from confidential encounter on 11/08/2018 Telemedicine from confidential encounter on 10/25/2018  PHQ-2 Total Score 0 4 2 1 4   PHQ-9 Total Score -- 10 8 1 16        Assessment and Plan: 59yo divorced AAF with schizoaffective disorder bipolar type(vs paranoid  schizophrenia),chronicPTSDand bingeeating disorder.She tried ziprazidone but developedQTC prolongation on itso it was changed toSaphriswhich she believes is a best medication she has ever been on. [name] also on escitalopram andwas onVyvanse for binge eating disorder. Jany used to weigh over 300 lbs and since starting Vyvanse her binge eating is well controlled and she was able to lose a lot of weight). She continues to take diazepam 10 mgtidand trazodone for sleep (100 mg plus 50 mg in am "for nerves").She has a strained relationship with her family - mother in particular, but also son. [name] not comfortable living with her mother but has an independent apartmentas of this month. She denies feeling particularly depressed but remains anxious and feels that generic asenapine which she got instead of Saphris is less effective in controlling paranoia/irritability/anxiety.We have added brexpiprazole but she still is taking generic Saphris with minimal benefit. She likes her new apartment but feels lonely, scared there. [name] to some extent aware that these fears are irrational.  Dx: Schizoaffective disorder bipolar type; PTSD chronic; Binge eating disorder  Plan: Continue trazodone100 mg prn sleepplus 50 mg during the day for anxiety,Vyvanse70 mg,prazosin 5 mg bid, gabapentin 600 bidand diazepam 10 mg tid, escitalopram 40 mg daily. We will dc brexpiprazole and try to get brand name Saphris at a previously effective 10 mg tid dose approved.  Next appointment in3weeks. The plan was discussed with patient who had an opportunity to ask questions and these were all answered.I spend2minutes in phone consultation with the patient.   Stephanie Acre, MD 02/04/2020, 10:33 AM

## 2020-02-09 ENCOUNTER — Ambulatory Visit: Payer: Self-pay

## 2020-02-09 NOTE — Telephone Encounter (Signed)
Patient called and says she's in pain and is constantly falling, she needs a wheelchair and the form to be sent to the home health. I advised her last call to the office on 01/20/20, it was noted the form was re-faxed. She says it's taking too long for them to get a wheelchair and an aid. She asks if she can come to see Dr. Margarita Rana to get something else for pain, because she's in constant pain. I advised no earlier appointment available than the one she already has on 02/18/20.   Reason for Disposition . [1] Caller requesting NON-URGENT health information AND [2] PCP's office is the best resource  Answer Assessment - Initial Assessment Questions 1. REASON FOR CALL or QUESTION: "What is your reason for calling today?" or "How can I best help you?" or "What question do you have that I can help answer?"     I'm in pain and need a wheelchair to get around  Protocols used: [place]

## 2020-02-13 ENCOUNTER — Telehealth (HOSPITAL_COMMUNITY): Payer: Self-pay

## 2020-02-13 NOTE — Telephone Encounter (Signed)
Patient called requesting a letter stating that she's not in a good environment and has no support or stability from her family for her emotional health. She also requested that I include she's compliant on medications and treatment in the letter. Is it ok to do the letter?

## 2020-02-14 NOTE — Telephone Encounter (Signed)
We should not comment on her environment but we can write "per patient's report"; otherwise we can write her such a letter.

## 2020-02-16 ENCOUNTER — Other Ambulatory Visit: Payer: Self-pay

## 2020-02-16 ENCOUNTER — Other Ambulatory Visit: Payer: Self-pay | Admitting: Family Medicine

## 2020-02-16 ENCOUNTER — Encounter (HOSPITAL_COMMUNITY): Payer: Self-pay

## 2020-02-16 ENCOUNTER — Encounter (HOSPITAL_COMMUNITY): Payer: Self-pay | Admitting: Psychiatry

## 2020-02-16 ENCOUNTER — Ambulatory Visit (INDEPENDENT_AMBULATORY_CARE_PROVIDER_SITE_OTHER): Payer: 59 | Admitting: Psychiatry

## 2020-02-16 DIAGNOSIS — F200 Paranoid schizophrenia: Secondary | ICD-10-CM

## 2020-02-16 DIAGNOSIS — F4310 Post-traumatic stress disorder, unspecified: Secondary | ICD-10-CM | POA: Diagnosis not present

## 2020-02-16 DIAGNOSIS — F250 Schizoaffective disorder, bipolar type: Secondary | ICD-10-CM | POA: Diagnosis not present
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
## 2020-02-16 NOTE — Telephone Encounter (Signed)
Requested medications are due for refill today.  Unknown  Requested medications are on the active medications list.  no  Last refill. Unknown  Future visit scheduled.   Yes in 2 days - Dr. Margarita Rana  Notes to clinic.  Last RX for KCL was written by Florencia Reasons MD
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
## 2020-02-17 ENCOUNTER — Telehealth (INDEPENDENT_AMBULATORY_CARE_PROVIDER_SITE_OTHER): Payer: 59 | Admitting: Psychiatry

## 2020-02-17 ENCOUNTER — Other Ambulatory Visit: Payer: Self-pay

## 2020-02-17 DIAGNOSIS — F250 Schizoaffective disorder, bipolar type: Secondary | ICD-10-CM

## 2020-02-17 NOTE — Telephone Encounter (Signed)
Letter done

## 2020-02-17 NOTE — Progress Notes (Signed)
[facility] MD/PA/NP OP Progress Note  02/17/2020 10:25 AM Tiffany [name]  MRN:  [id number] Interview was conducted by phone and I verified that I was speaking with the correct person using two identifiers. I discussed the limitations of evaluation and management by telemedicine and  the availability of in person appointments. Patient expressed understanding and agreed to proceed. Participants in the visit: patient (location - home); physician (location - home office).  Chief Complaint: Depression, paranoia.  HPI: 60yo divorced AAF with schizoaffective disorder bipolar type(vs paranoid schizophrenia),chronicPTSDand bingeeating disorder.She tried ziprazidone but developedQTC prolongation on itso it was changed toSaphriswhich she believes is a best medication she has ever been on. She had also been prescribed risperidone, aripiprazole, lurasidone in the past. [name] also on escitalopram andwas onVyvanse for binge eating disorder. Dymon used to weigh over 300 lbs and since starting Vyvanse her binge eating is well controlled and she was able to lose a lot of weight). She continues to take diazepam 10 mgtidand trazodone for sleep (100 mg plus 50 mg in am "for nerves").She has a strained relationship with her family - mother in particular, but also son. [name] not comfortable living with her mother but has an independent apartmentas November 2021. She admits to feeling moderately depressed and anxious and exp[ressed belief that generic asenapine which she got instead of Saphris was less effectivein controlling paranoia/irritability/anxiety.We have tried to cross taper it to brexpiprazole but she she felt more paranoid and stayed on generic Saphris with minimal benefit. We have stopped brexpiprazole and increased dose of Saphris (brand) to 10 mg tid. [name] still paranoid but perhaps  Shows some imp[rovement. She likes her new apartment but feels lonely, scared there. Sereniti still believes someone (likely  inspired by her son) is getting in there and stealing her clothes. [name] to some extent aware that these fears are irratiottnal but intends to get a restraining order against her son nevertheless.   Lisabeth has multiple medical problems which contribute to her mood decline: chronic pain/osteoarthritis/gout, type 2 DM with neuropathy, COPD, CHF, stage 3 kidney disease, OSA to name a few. She has difficulty ambulating. In the past she had home aid help but "they were no good" and it sounds as though she fired them. It is likely that her paranoia was a contributing factor in her decision making.    Visit Diagnosis:    ICD-10-CM   1. Schizoaffective disorder, bipolar type ([place])  F25.0     Past Psychiatric History: Please see intake H&P.  Past Medical History:  Past Medical History:  Diagnosis Date  . Agitation 11/22/2017  . Anoxic brain injury ([place]) 09/08/2016   C. Arrest due to respiratory failure and COPD exacerbation  . Anxiety   . Arthritis    "all over" (04/10/2016)  . Asthma 10/18/2010  . Binge eating disorder   . Cardiac arrest ([place]) 09/08/2016   PEA  . Carotid artery stenosis    1-39% bilateral by dopplers 11/2016  . Chronic diastolic (congestive) heart failure ([place])   . Chronic kidney disease    "I see a kidney dr." (04/10/2016)  . Chronic pain syndrome 06/18/2012  . Chronic post-traumatic stress disorder (PTSD) 05/27/2018  . Chronic respiratory failure with hypoxia and hypercapnia (HCC) 06/22/2015   TRILOGY Vent >AVAPA-ES., Vt target 200-400, Max P 30 , PS max 20 , PS min 6-10 , E Max 6, E Min 4, Rate Auto AVAPS Rate 2 (titrate for pt comfort) , bleed O2 at 5l/m continuous flow .   [name]  CKD (chronic kidney disease) stage 3, GFR 30-59 ml/min (HCC) 12/15/2016  . Closed displaced fracture of fifth metacarpal bone 03/21/2018  . Cocaine use disorder, severe, in sustained remission ([place]) 12/17/2015  . Complication of anesthesia    decreased bp, decreased heart rate  . COPD (chronic obstructive  pulmonary disease) ([place]) 07/08/2014  . Depression   . Diabetic neuropathy ([place]) 04/24/2011  . Difficulty with speech 01/24/2018  . Disorder of nervous system   . Drug abuse ([place]) 11/21/2017  . Dyslipidemia 04/24/2011  . Elevated troponin 04/28/2012  . Emphysema   . Encephalopathy 11/21/2017  . Essential hypertension 03/22/2016  . Fibula fracture 07/10/2016  . Frequent falls 10/11/2017  . GERD (gastroesophageal reflux disease)   . Gout 04/11/2017  . Heart attack ([place]) 1980s  . History of blood transfusion 1994   "couldn't stop bleeding from my period"  . History of drug abuse in remission ([place]) 11/28/2015   Quit in 2017  . Hyperlipidemia LDL goal <70   . Incontinence   . Manic depression ([place])   . Morbid obesity ([place]) 10/18/2010  . Obstructive sleep apnea 10/18/2010  . On home oxygen therapy    "6L; 24/7" (04/10/2016)  . OSA on CPAP    "wear mask sometimes" (04/10/2016)  . Paranoid ([place])    "sometimes; I'm on RX for it" (04/10/2016)  . Prolonged Q-T interval on ECG   . Rectal bleeding 12/31/2015  . Right carotid bruit 11/09/2016  . Schizoaffective disorder, bipolar type ([place]) 04/05/2018  . Seasonal allergies   . Seborrheic keratoses 12/31/2013  . Seizures ([place])    "don't know what kind; last one was ~ 1 yr ago" (04/10/2016)  . Stroke [facility]) 1980s   denies residual on 04/10/2016  . Thrush 09/19/2013  . Type 2 diabetes mellitus ([place]) 10/18/2010    Past Surgical History:  Procedure Laterality Date  . CESAREAN SECTION  1997  . HERNIA REPAIR    . IR CHOLANGIOGRAM EXISTING TUBE  07/20/2016  . IR PERC CHOLECYSTOSTOMY  05/10/2016  . IR RADIOLOGIST EVAL & MGMT  06/08/2016  . IR RADIOLOGIST EVAL & MGMT  06/29/2016  . IR SINUS/FIST TUBE CHK-NON GI  07/12/2016  . RIGHT/LEFT HEART CATH AND CORONARY ANGIOGRAPHY N/A 06/19/2017   Procedure: RIGHT/LEFT HEART CATH AND CORONARY ANGIOGRAPHY;  Surgeon: Jolaine Artist, MD;  Location: [facility];  Service: Cardiovascular;  Laterality: N/A;  . TIBIA IM NAIL  INSERTION Right 07/12/2016   Procedure: INTRAMEDULLARY (IM) NAIL RIGHT TIBIA;  Surgeon: Leandrew Koyanagi, MD;  Location: [facility];  Service: Orthopedics;  Laterality: Right;  . UMBILICAL HERNIA REPAIR  ~ 1963   "that's why I don't have a belly button"  . VAGINAL HYSTERECTOMY      Family Psychiatric History: Reviewed.  Family History:  Family History  Problem Relation Age of Onset  . Cancer Father        prostate  . Cancer Mother        lung  . Depression Mother   . Depression Sister   . Anxiety disorder Sister   . Schizophrenia Sister   . Bipolar disorder Sister   . Depression Sister   . Depression Brother   . Heart failure Other        cousin    Social History:  Social History   Socioeconomic History  . Marital status: Widowed    Spouse name: Not on file  . Number of children: 3  . Years of education: Not on file  .  Highest education level: Not on file  Occupational History  . Occupation: disabled    Comment: factory production  Tobacco Use  . Smoking status: Former Smoker    Packs/day: 1.50    Years: 38.00    Pack years: 57.00    Types: Cigarettes    Start date: 03/13/1977    Quit date: 04/10/2016    Years since quitting: 3.8  . Smokeless tobacco: Never Used  Vaping Use  . Vaping Use: Never used  Substance and Sexual Activity  . Alcohol use: No    Alcohol/week: 0.0 standard drinks  . Drug use: No    Types: Cocaine    Comment: 04/10/2016 "last used cocaine back in November 2017"  . Sexual activity: Not Currently    Birth control/protection: Surgical  Other Topics Concern  . Not on file  Social History Narrative   Has 1 son, [name]   Lives with son and his boyfriend   Her house has ramps and handrails should she ever needs them.    Her mother lives down the street from her and is a good support person in addition to her son.   She drives herself, has private transportation.    Cocaine free since 02/24/16, smoke free since 04/10/16   Social Determinants of Health    Financial Resource Strain: Not on file  Food Insecurity: Not on file  Transportation Needs: Not on file  Physical Activity: Not on file  Stress: Not on file  Social Connections: Not on file    Allergies:  Allergies  Allergen Reactions  . Hydrocodone Shortness Of Breath  . Hydrocodone-Acetaminophen Shortness Of Breath  . Hydroxyzine Anaphylaxis and Shortness Of Breath  . Latuda [Lurasidone Hcl] Anaphylaxis  . Lurasidone Anaphylaxis  . Magnesium-Containing Compounds Anaphylaxis  . Prednisone Anaphylaxis, Swelling and Other (See Comments)    Tongue swelling  . Tramadol Anaphylaxis and Swelling  . Codeine Nausea And Vomiting  . Other Rash  . Sulfa Antibiotics Itching  . Tape Rash    Metabolic Disorder Labs: Lab Results  Component Value Date   HGBA1C 5.6 11/08/2018   MPG 125.5 01/07/2017   MPG 111.15 11/03/2016   No results found for: PROLACTIN Lab Results  Component Value Date   CHOL 127 08/05/2018   TRIG 53 08/05/2018   HDL 62 08/05/2018   CHOLHDL 2.0 08/05/2018   VLDL 38 01/07/2017   LDLCALC 54 08/05/2018   LDLCALC 56 01/07/2017   Lab Results  Component Value Date   TSH 1.233 10/27/2019   TSH 0.563 03/15/2016    Therapeutic Level Labs: No results found for: LITHIUM Lab Results  Component Value Date   VALPROATE 20 (L) 09/09/2016   VALPROATE 46 (L) 07/10/2016   No components found for:  CBMZ  Current Medications: Current Outpatient Medications  Medication Sig Dispense Refill  . albuterol (PROVENTIL) (2.5 MG/3ML) 0.083% nebulizer solution Take 3 mLs (2.5 mg total) by nebulization every 6 (six) hours as needed for wheezing or shortness of breath. 75 mL 5  . albuterol (VENTOLIN HFA) 108 (90 Base) MCG/ACT inhaler Inhale 2 puffs into the lungs every 6 (six) hours as needed for wheezing or shortness of breath. 18 g 2  . atorvastatin (LIPITOR) 80 MG tablet Take 1 tablet (80 mg total) by mouth daily. 90 tablet 1  . Blood Pressure Monitoring (BLOOD PRESSURE  CUFF) MISC 1 Units by Does not apply route daily. 1 each 0  . colchicine 0.6 MG tablet Take 1 tablet (0.6 mg total) by mouth  2 (two) times daily. 180 tablet 0  . diazepam (VALIUM) 10 MG tablet Take 1 tablet (10 mg total) by mouth 3 (three) times daily as needed for anxiety. 90 tablet 1  . diclofenac (VOLTAREN) 75 MG EC tablet Take 1 tablet (75 mg total) by mouth 2 (two) times daily. 90 tablet 1  . diclofenac Sodium (VOLTAREN) 1 % GEL Apply 2 g topically 4 (four) times daily. 350 g 0  . Elastic Bandages & Supports (MEDICAL COMPRESSION STOCKINGS) MISC 1 Units by Does not apply route daily. 1 each 1  . escitalopram (LEXAPRO) 20 MG tablet Take 2 tablets (40 mg total) by mouth daily. 60 tablet 2  . febuxostat (ULORIC) 40 MG tablet Take 1 tablet (40 mg total) by mouth daily. 90 tablet 0  . ferrous sulfate 325 (65 FE) MG tablet Take 1 tablet (325 mg total) by mouth daily with breakfast. 180 tablet 0  . Fluticasone-Umeclidin-Vilant (TRELEGY ELLIPTA) 100-62.5-25 MCG/INH AEPB Inhale 1 puff into the lungs daily. 60 each 11  . gabapentin (NEURONTIN) 600 MG tablet Take 1 tablet (600 mg total) by mouth 2 (two) times daily. 180 tablet 1  . lidocaine (LIDODERM) 5 % Place 1 patch onto the skin daily. 30 patch 0  . lisdexamfetamine (VYVANSE) 70 MG capsule Take 1 capsule (70 mg total) by mouth daily before breakfast. 30 capsule 0  . montelukast (SINGULAIR) 10 MG tablet Take 1 tablet (10 mg total) by mouth at bedtime. 30 tablet 11  . Multiple Vitamin (MULTIVITAMIN WITH MINERALS) TABS tablet Take 1 tablet by mouth daily.    [name] omeprazole (PRILOSEC) 40 MG capsule Take 1 capsule (40 mg total) by mouth daily. 90 capsule 1  . oxybutynin (DITROPAN) 5 MG tablet Take 1 tablet (5 mg total) by mouth 2 (two) times daily. 180 tablet 1  . potassium chloride SA (KLOR-CON) 20 MEQ tablet Take 2 tablets (40 mEq total) by mouth daily. 360 tablet 1  . prazosin (MINIPRESS) 5 MG capsule TAKE 1 CAPSULE BY MOUTH TWICE DAILY 180 capsule 0  .  saccharomyces boulardii (FLORASTOR) 250 MG capsule Take 1 capsule (250 mg total) by mouth 2 (two) times daily. 20 capsule 0  . SAPHRIS 10 MG SUBL Place 1 tablet (10 mg total) under the tongue in the morning, at noon, and at bedtime. 90 tablet 2  . tiZANidine (ZANAFLEX) 4 MG tablet Take 1 tablet (4 mg total) by mouth every 8 (eight) hours as needed for muscle spasms. 60 tablet 1  . traZODone (DESYREL) 100 MG tablet Take 0.5 tablet (50 mg) in am and one tablet (100 mg) at bedtime 45 tablet 2   No current facility-administered medications for this visit.     Psychiatric Specialty Exam: Review of Systems  Musculoskeletal: Positive for arthralgias and gait problem.  Psychiatric/Behavioral: The patient is nervous/anxious.   All other systems reviewed and are negative.   There were no vitals taken for this visit.There is no height or weight on file to calculate BMI.  General Appearance: NA  Eye Contact:  NA  Speech:  Slow  Volume:  Normal  Mood:  Anxious and Depressed  Affect:  NA  Thought Process:  Goal Directed  Orientation:  Full (Time, Place, and Person)  Thought Content: Paranoid Ideation   Suicidal Thoughts:  No  Homicidal Thoughts:  No  Memory:  Immediate;   Fair Recent;   Fair Remote;   Fair  Judgement:  Fair  Insight:  Fair  Psychomotor Activity:  NA  Concentration:  Concentration: Fair  Recall:  [employer] of Knowledge: Fair  Language: Fair  Akathisia:  Negative  Handed:  Right  AIMS (if indicated): not done  Assets:  Desire for Improvement Financial Resources/Insurance Housing  ADL's:  Intact  Cognition: WNL  Sleep:  Good   Screenings: AUDIT   Flowsheet Row Admission (Discharged) from 12/01/2015 in [facility]  Alcohol Use Disorder Identification Test Final Score (AUDIT) 0    GAD-7   Flowsheet Row Office Visit from 03/21/2018 in [facility] for [facility]  Total GAD-7 Score 4    PHQ2-9   Axis Visit from  confidential encounter on 12/11/2018 Office Visit from confidential encounter on 12/10/2018 Office Visit from confidential encounter on 11/18/2018 Office Visit from confidential encounter on 11/08/2018 Telemedicine from confidential encounter on 10/25/2018  PHQ-2 Total Score 0 4 2 1 4   PHQ-9 Total Score -- 10 8 1 16        Assessment and Plan: 60yo divorced AAF with schizoaffective disorder bipolar type(vs paranoid schizophrenia),chronicPTSDand bingeeating disorder.She tried ziprazidone but developedQTC prolongation on itso it was changed toSaphriswhich she believes is a best medication she has ever been on. She had also been prescribed risperidone, aripiprazole, lurasidone in the past. [name] also on escitalopram andwas onVyvanse for binge eating disorder. Jolita used to weigh over 300 lbs and since starting Vyvanse her binge eating is well controlled and she was able to lose a lot of weight). She continues to take diazepam 10 mgtidand trazodone for sleep (100 mg plus 50 mg in am "for nerves").She has a strained relationship with her family - mother in particular, but also son. [name] not comfortable living with her mother but has an independent apartmentas November 2021. She admits to feeling moderately depressed and anxious and exp[ressed belief that generic asenapine which she got instead of Saphris was less effectivein controlling paranoia/irritability/anxiety.We have tried to cross taper it to brexpiprazole but she she felt more paranoid and stayed on generic Saphris with minimal benefit. We have stopped brexpiprazole and increased dose of Saphris (brand) to 10 mg tid. [name] still paranoid but perhaps  Shows some imp[rovement. She likes her new apartment but feels lonely, scared there. Hawley still believes someone (likely inspired by her son) is getting in there and stealing her clothes. [name] to some extent aware that these fears are irratiottnal but intends to get a restraining order  against her son nevertheless.   Lacora has multiple medical problems which contribute to her mood decline: chronic pain/osteoarthritis/gout, type 2 DM with neuropathy, COPD, CHF, stage 3 kidney disease, OSA to name a few. She has difficulty ambulating. In the past she had home aid help but "they were no good" and it sounds as though she fired them. It is likely that her paranoia was a contributing factor in her decision making.  Dx: Schizoaffective disorder bipolar type  Plan: Continue asenapine (brand) 10 mg tid, trazodone100 mg prn sleepplus 50 mg during the day for anxiety,prazosin 5 mg bid, gabapentin 600 bidand diazepam 10 mg tid,escitalopram40 mg daily. [name] also taking  Vyvanse for binge eating. We have discussed an option of referral to more intense CST services and Allaina is open to such a referral particularly in light of my imminent retirement.  Next appointment in3weeks. The plan was discussed with patient who had an opportunity to ask questions and these were all answered.I spend21minutes in phone consultation with the patient.   Stephanie Acre, MD 02/17/2020, 10:25  AM

## 2020-02-18 ENCOUNTER — Ambulatory Visit: Payer: Medicare Other | Admitting: Family Medicine

## 2020-02-18 ENCOUNTER — Encounter (HOSPITAL_COMMUNITY): Payer: Self-pay

## 2020-02-18 ENCOUNTER — Inpatient Hospital Stay (HOSPITAL_COMMUNITY): Payer: 59

## 2020-02-18 ENCOUNTER — Emergency Department (HOSPITAL_COMMUNITY): Payer: 59

## 2020-02-18 ENCOUNTER — Ambulatory Visit: Payer: Medicaid Other | Admitting: Physical Medicine and Rehabilitation

## 2020-02-18 ENCOUNTER — Inpatient Hospital Stay (HOSPITAL_COMMUNITY)
Admission: EM | Admit: 2020-02-18 | Discharge: 2020-02-26 | DRG: 682 | Disposition: A | Discharge status: Skilled Nursing Facility | Payer: 59 | Attending: Internal Medicine | Admitting: Internal Medicine

## 2020-02-18 DIAGNOSIS — E876 Hypokalemia: Secondary | ICD-10-CM | POA: Diagnosis present

## 2020-02-18 DIAGNOSIS — D631 Anemia in chronic kidney disease: Secondary | ICD-10-CM | POA: Diagnosis present

## 2020-02-18 DIAGNOSIS — I10 Essential (primary) hypertension: Secondary | ICD-10-CM | POA: Diagnosis present

## 2020-02-18 DIAGNOSIS — N189 Chronic kidney disease, unspecified: Secondary | ICD-10-CM | POA: Insufficient documentation

## 2020-02-18 DIAGNOSIS — G4733 Obstructive sleep apnea (adult) (pediatric): Secondary | ICD-10-CM | POA: Diagnosis present

## 2020-02-18 DIAGNOSIS — Z1611 Resistance to penicillins: Secondary | ICD-10-CM | POA: Diagnosis present

## 2020-02-18 DIAGNOSIS — E872 Acidosis, unspecified: Secondary | ICD-10-CM | POA: Diagnosis present

## 2020-02-18 DIAGNOSIS — F259 Schizoaffective disorder, unspecified: Secondary | ICD-10-CM | POA: Diagnosis not present

## 2020-02-18 DIAGNOSIS — F1421 Cocaine dependence, in remission: Secondary | ICD-10-CM | POA: Diagnosis present

## 2020-02-18 DIAGNOSIS — N184 Chronic kidney disease, stage 4 (severe): Secondary | ICD-10-CM | POA: Diagnosis present

## 2020-02-18 DIAGNOSIS — N390 Urinary tract infection, site not specified: Secondary | ICD-10-CM | POA: Diagnosis present

## 2020-02-18 DIAGNOSIS — J439 Emphysema, unspecified: Secondary | ICD-10-CM | POA: Diagnosis present

## 2020-02-18 DIAGNOSIS — R339 Retention of urine, unspecified: Secondary | ICD-10-CM | POA: Diagnosis present

## 2020-02-18 DIAGNOSIS — K219 Gastro-esophageal reflux disease without esophagitis: Secondary | ICD-10-CM | POA: Diagnosis present

## 2020-02-18 DIAGNOSIS — F22 Delusional disorders: Secondary | ICD-10-CM

## 2020-02-18 DIAGNOSIS — R109 Unspecified abdominal pain: Secondary | ICD-10-CM

## 2020-02-18 DIAGNOSIS — Z8673 Personal history of transient ischemic attack (TIA), and cerebral infarction without residual deficits: Secondary | ICD-10-CM

## 2020-02-18 DIAGNOSIS — Z6828 Body mass index (BMI) 28.0-28.9, adult: Secondary | ICD-10-CM

## 2020-02-18 DIAGNOSIS — J449 Chronic obstructive pulmonary disease, unspecified: Secondary | ICD-10-CM | POA: Diagnosis present

## 2020-02-18 DIAGNOSIS — E11649 Type 2 diabetes mellitus with hypoglycemia without coma: Secondary | ICD-10-CM | POA: Diagnosis present

## 2020-02-18 DIAGNOSIS — M109 Gout, unspecified: Secondary | ICD-10-CM | POA: Diagnosis present

## 2020-02-18 DIAGNOSIS — E860 Dehydration: Secondary | ICD-10-CM | POA: Diagnosis present

## 2020-02-18 DIAGNOSIS — J9611 Chronic respiratory failure with hypoxia: Secondary | ICD-10-CM | POA: Diagnosis present

## 2020-02-18 DIAGNOSIS — E1140 Type 2 diabetes mellitus with diabetic neuropathy, unspecified: Secondary | ICD-10-CM | POA: Diagnosis present

## 2020-02-18 DIAGNOSIS — I5032 Chronic diastolic (congestive) heart failure: Secondary | ICD-10-CM | POA: Diagnosis present

## 2020-02-18 DIAGNOSIS — E1122 Type 2 diabetes mellitus with diabetic chronic kidney disease: Secondary | ICD-10-CM | POA: Diagnosis present

## 2020-02-18 DIAGNOSIS — I252 Old myocardial infarction: Secondary | ICD-10-CM

## 2020-02-18 DIAGNOSIS — Z9114 Patient's other noncompliance with medication regimen: Secondary | ICD-10-CM

## 2020-02-18 DIAGNOSIS — F250 Schizoaffective disorder, bipolar type: Secondary | ICD-10-CM | POA: Diagnosis present

## 2020-02-18 DIAGNOSIS — F17200 Nicotine dependence, unspecified, uncomplicated: Secondary | ICD-10-CM | POA: Diagnosis not present

## 2020-02-18 DIAGNOSIS — F209 Schizophrenia, unspecified: Secondary | ICD-10-CM | POA: Diagnosis present

## 2020-02-18 DIAGNOSIS — R443 Hallucinations, unspecified: Secondary | ICD-10-CM | POA: Diagnosis present

## 2020-02-18 DIAGNOSIS — D72819 Decreased white blood cell count, unspecified: Secondary | ICD-10-CM | POA: Diagnosis present

## 2020-02-18 DIAGNOSIS — Z882 Allergy status to sulfonamides status: Secondary | ICD-10-CM

## 2020-02-18 DIAGNOSIS — N1832 Chronic kidney disease, stage 3b: Secondary | ICD-10-CM | POA: Diagnosis present

## 2020-02-18 DIAGNOSIS — N179 Acute kidney failure, unspecified: Principal | ICD-10-CM | POA: Diagnosis present

## 2020-02-18 DIAGNOSIS — Z7951 Long term (current) use of inhaled steroids: Secondary | ICD-10-CM

## 2020-02-18 DIAGNOSIS — E119 Type 2 diabetes mellitus without complications: Secondary | ICD-10-CM

## 2020-02-18 DIAGNOSIS — Z79899 Other long term (current) drug therapy: Secondary | ICD-10-CM

## 2020-02-18 DIAGNOSIS — Z888 Allergy status to other drugs, medicaments and biological substances status: Secondary | ICD-10-CM

## 2020-02-18 DIAGNOSIS — R4182 Altered mental status, unspecified: Secondary | ICD-10-CM

## 2020-02-18 DIAGNOSIS — E1142 Type 2 diabetes mellitus with diabetic polyneuropathy: Secondary | ICD-10-CM

## 2020-02-18 DIAGNOSIS — E785 Hyperlipidemia, unspecified: Secondary | ICD-10-CM | POA: Diagnosis present

## 2020-02-18 DIAGNOSIS — Z9109 Other allergy status, other than to drugs and biological substances: Secondary | ICD-10-CM

## 2020-02-18 DIAGNOSIS — G894 Chronic pain syndrome: Secondary | ICD-10-CM | POA: Diagnosis present

## 2020-02-18 DIAGNOSIS — G9341 Metabolic encephalopathy: Secondary | ICD-10-CM | POA: Diagnosis present

## 2020-02-18 DIAGNOSIS — Z90710 Acquired absence of both cervix and uterus: Secondary | ICD-10-CM

## 2020-02-18 DIAGNOSIS — R634 Abnormal weight loss: Secondary | ICD-10-CM | POA: Diagnosis present

## 2020-02-18 DIAGNOSIS — E1165 Type 2 diabetes mellitus with hyperglycemia: Secondary | ICD-10-CM | POA: Diagnosis not present

## 2020-02-18 DIAGNOSIS — E46 Unspecified protein-calorie malnutrition: Secondary | ICD-10-CM | POA: Diagnosis present

## 2020-02-18 DIAGNOSIS — B9620 Unspecified Escherichia coli [E. coli] as the cause of diseases classified elsewhere: Secondary | ICD-10-CM | POA: Diagnosis present

## 2020-02-18 DIAGNOSIS — F200 Paranoid schizophrenia: Secondary | ICD-10-CM | POA: Diagnosis present

## 2020-02-18 DIAGNOSIS — E871 Hypo-osmolality and hyponatremia: Secondary | ICD-10-CM | POA: Diagnosis present

## 2020-02-18 DIAGNOSIS — F4312 Post-traumatic stress disorder, chronic: Secondary | ICD-10-CM | POA: Diagnosis present

## 2020-02-18 DIAGNOSIS — Z87892 Personal history of anaphylaxis: Secondary | ICD-10-CM

## 2020-02-18 DIAGNOSIS — R7989 Other specified abnormal findings of blood chemistry: Secondary | ICD-10-CM | POA: Diagnosis present

## 2020-02-18 DIAGNOSIS — Z8674 Personal history of sudden cardiac arrest: Secondary | ICD-10-CM

## 2020-02-18 DIAGNOSIS — Z0189 Encounter for other specified special examinations: Secondary | ICD-10-CM

## 2020-02-18 DIAGNOSIS — N183 Chronic kidney disease, stage 3 unspecified: Secondary | ICD-10-CM | POA: Diagnosis present

## 2020-02-18 DIAGNOSIS — J9612 Chronic respiratory failure with hypercapnia: Secondary | ICD-10-CM | POA: Diagnosis present

## 2020-02-18 DIAGNOSIS — Z8249 Family history of ischemic heart disease and other diseases of the circulatory system: Secondary | ICD-10-CM

## 2020-02-18 DIAGNOSIS — E538 Deficiency of other specified B group vitamins: Secondary | ICD-10-CM | POA: Diagnosis present

## 2020-02-18 DIAGNOSIS — Z818 Family history of other mental and behavioral disorders: Secondary | ICD-10-CM

## 2020-02-18 DIAGNOSIS — M1990 Unspecified osteoarthritis, unspecified site: Secondary | ICD-10-CM | POA: Diagnosis present

## 2020-02-18 DIAGNOSIS — I130 Hypertensive heart and chronic kidney disease with heart failure and stage 1 through stage 4 chronic kidney disease, or unspecified chronic kidney disease: Secondary | ICD-10-CM | POA: Diagnosis present

## 2020-02-18 DIAGNOSIS — R569 Unspecified convulsions: Secondary | ICD-10-CM | POA: Diagnosis present

## 2020-02-18 DIAGNOSIS — Z885 Allergy status to narcotic agent status: Secondary | ICD-10-CM

## 2020-02-18 DIAGNOSIS — Z20822 Contact with and (suspected) exposure to covid-19: Secondary | ICD-10-CM | POA: Diagnosis present

## 2020-02-18 DIAGNOSIS — I6529 Occlusion and stenosis of unspecified carotid artery: Secondary | ICD-10-CM | POA: Diagnosis present

## 2020-02-18 DIAGNOSIS — F29 Unspecified psychosis not due to a substance or known physiological condition: Secondary | ICD-10-CM | POA: Diagnosis present

## 2020-02-18 DIAGNOSIS — F1910 Other psychoactive substance abuse, uncomplicated: Secondary | ICD-10-CM | POA: Diagnosis present

## 2020-02-18 DIAGNOSIS — Z87891 Personal history of nicotine dependence: Secondary | ICD-10-CM

## 2020-02-18 DIAGNOSIS — E861 Hypovolemia: Secondary | ICD-10-CM | POA: Diagnosis present

## 2020-02-18 HISTORY — DX: Chronic kidney disease, unspecified: N17.9

## 2020-02-18 LAB — CBC WITH DIFFERENTIAL/PLATELET
Abs Immature Granulocytes: 0 10*3/uL (ref 0.00–0.07)
Basophils Absolute: 0 10*3/uL (ref 0.0–0.1)
Basophils Relative: 0 %
Eosinophils Absolute: 0 10*3/uL (ref 0.0–0.5)
Eosinophils Relative: 0 %
HCT: 34 % — ABNORMAL LOW (ref 36.0–46.0)
Hemoglobin: 10.2 g/dL — ABNORMAL LOW (ref 12.0–15.0)
Immature Granulocytes: 0 %
Lymphocytes Relative: 19 %
Lymphs Abs: 0.5 10*3/uL — ABNORMAL LOW (ref 0.7–4.0)
MCH: 27.9 pg (ref 26.0–34.0)
MCHC: 30 g/dL (ref 30.0–36.0)
MCV: 92.9 fL (ref 80.0–100.0)
Monocytes Absolute: 0.3 10*3/uL (ref 0.1–1.0)
Monocytes Relative: 13 %
Neutro Abs: 1.8 10*3/uL (ref 1.7–7.7)
Neutrophils Relative %: 68 %
Platelets: 325 10*3/uL (ref 150–400)
RBC: 3.66 MIL/uL — ABNORMAL LOW (ref 3.87–5.11)
RDW: 13.9 % (ref 11.5–15.5)
WBC: 2.7 10*3/uL — ABNORMAL LOW (ref 4.0–10.5)
nRBC: 1.1 % — ABNORMAL HIGH (ref 0.0–0.2)

## 2020-02-18 LAB — URIC ACID: Uric Acid, Serum: 3 mg/dL (ref 2.5–7.1)

## 2020-02-18 LAB — BASIC METABOLIC PANEL
Anion gap: 13 (ref 5–15)
BUN: 41 mg/dL — ABNORMAL HIGH (ref 6–20)
CO2: 17 mmol/L — ABNORMAL LOW (ref 22–32)
Calcium: 6.9 mg/dL — ABNORMAL LOW (ref 8.9–10.3)
Chloride: 106 mmol/L (ref 98–111)
Creatinine, Ser: 5.58 mg/dL — ABNORMAL HIGH (ref 0.44–1.00)
GFR, Estimated: 8 mL/min — ABNORMAL LOW (ref >60)
Glucose, Bld: 127 mg/dL — ABNORMAL HIGH (ref 70–99)
Potassium: 2.7 mmol/L — CL (ref 3.5–5.1)
Sodium: 136 mmol/L (ref 135–145)

## 2020-02-18 LAB — COMPREHENSIVE METABOLIC PANEL
ALT: 31 U/L (ref 0–44)
AST: 40 U/L (ref 15–41)
Albumin: 3.4 g/dL — ABNORMAL LOW (ref 3.5–5.0)
Alkaline Phosphatase: 152 U/L — ABNORMAL HIGH (ref 38–126)
Anion gap: 16 — ABNORMAL HIGH (ref 5–15)
BUN: 39 mg/dL — ABNORMAL HIGH (ref 6–20)
CO2: 13 mmol/L — ABNORMAL LOW (ref 22–32)
Calcium: 7.2 mg/dL — ABNORMAL LOW (ref 8.9–10.3)
Chloride: 109 mmol/L (ref 98–111)
Creatinine, Ser: 6.26 mg/dL — ABNORMAL HIGH (ref 0.44–1.00)
GFR, Estimated: 7 mL/min — ABNORMAL LOW (ref >60)
Glucose, Bld: 63 mg/dL — ABNORMAL LOW (ref 70–99)
Potassium: 2.6 mmol/L — CL (ref 3.5–5.1)
Sodium: 138 mmol/L (ref 135–145)
Total Bilirubin: 0.8 mg/dL (ref 0.3–1.2)
Total Protein: 6.8 g/dL (ref 6.5–8.1)

## 2020-02-18 LAB — CBC
HCT: 32 % — ABNORMAL LOW (ref 36.0–46.0)
Hemoglobin: 10.1 g/dL — ABNORMAL LOW (ref 12.0–15.0)
MCH: 28.6 pg (ref 26.0–34.0)
MCHC: 31.6 g/dL (ref 30.0–36.0)
MCV: 90.7 fL (ref 80.0–100.0)
Platelets: 293 10*3/uL (ref 150–400)
RBC: 3.53 MIL/uL — ABNORMAL LOW (ref 3.87–5.11)
RDW: 13.9 % (ref 11.5–15.5)
WBC: 2.2 10*3/uL — ABNORMAL LOW (ref 4.0–10.5)
nRBC: 0 % (ref 0.0–0.2)

## 2020-02-18 LAB — CK: Total CK: 505 U/L — ABNORMAL HIGH (ref 38–234)

## 2020-02-18 LAB — BLOOD GAS, VENOUS
Acid-base deficit: 23.4 mmol/L — ABNORMAL HIGH (ref 0.0–2.0)
Bicarbonate: 4.8 mmol/L — ABNORMAL LOW (ref 20.0–28.0)
O2 Saturation: 85.8 %
Patient temperature: 98.6
pCO2, Ven: <19 mmHg — CL (ref 44.0–60.0)
pH, Ven: 7.076 — CL (ref 7.250–7.430)
pO2, Ven: 60.5 mmHg — ABNORMAL HIGH (ref 32.0–45.0)

## 2020-02-18 LAB — ETHANOL: Alcohol, Ethyl (B): <10 mg/dL (ref <10)

## 2020-02-18 LAB — BRAIN NATRIURETIC PEPTIDE: B Natriuretic Peptide: 584.2 pg/mL — ABNORMAL HIGH (ref 0.0–100.0)

## 2020-02-18 LAB — MRSA PCR SCREENING: MRSA by PCR: NEGATIVE

## 2020-02-18 LAB — PHOSPHORUS
Phosphorus: 4.7 mg/dL — ABNORMAL HIGH (ref 2.5–4.6)
Phosphorus: 5.9 mg/dL — ABNORMAL HIGH (ref 2.5–4.6)

## 2020-02-18 LAB — CBG MONITORING, ED: Glucose-Capillary: 52 mg/dL — ABNORMAL LOW (ref 70–99)

## 2020-02-18 LAB — RESP PANEL BY RT-PCR (FLU A&B, COVID) ARPGX2
Influenza A by PCR: NEGATIVE
Influenza B by PCR: NEGATIVE
SARS Coronavirus 2 by RT PCR: NEGATIVE

## 2020-02-18 LAB — MAGNESIUM
Magnesium: 0.9 mg/dL — CL (ref 1.7–2.4)
Magnesium: 1.9 mg/dL (ref 1.7–2.4)

## 2020-02-18 LAB — OSMOLALITY: Osmolality: 300 mOsm/kg — ABNORMAL HIGH (ref 275–295)

## 2020-02-18 LAB — CORTISOL: Cortisol, Plasma: 18.7 ug/dL

## 2020-02-18 LAB — LACTIC ACID, PLASMA
Lactic Acid, Venous: <0.3 mmol/L — ABNORMAL LOW (ref 0.5–1.9)
Lactic Acid, Venous: 0.8 mmol/L (ref 0.5–1.9)
Lactic Acid, Venous: 1.1 mmol/L (ref 0.5–1.9)

## 2020-02-18 LAB — ACETAMINOPHEN LEVEL: Acetaminophen (Tylenol), Serum: <10 ug/mL — ABNORMAL LOW (ref 10–30)

## 2020-02-18 LAB — BETA-HYDROXYBUTYRIC ACID: Beta-Hydroxybutyric Acid: 1.13 mmol/L — ABNORMAL HIGH (ref 0.05–0.27)

## 2020-02-18 LAB — SALICYLATE LEVEL: Salicylate Lvl: <7 mg/dL — ABNORMAL LOW (ref 7.0–30.0)

## 2020-02-18 LAB — VITAMIN D 25 HYDROXY (VIT D DEFICIENCY, FRACTURES): Vit D, 25-Hydroxy: 59.27 ng/mL (ref 30–100)

## 2020-02-18 LAB — TSH: TSH: 0.358 u[IU]/mL (ref 0.350–4.500)

## 2020-02-18 MED ORDER — CHLORHEXIDINE GLUCONATE CLOTH 2 % EX PADS
6.0000 | MEDICATED_PAD | Freq: Every day | CUTANEOUS | Status: DC
  Administered 2020-02-19 – 2020-02-21 (×2): 6 via TOPICAL

## 2020-02-18 MED ORDER — POTASSIUM CHLORIDE 10 MEQ/100ML IV SOLN
10.0000 meq | INTRAVENOUS | Status: AC
  Administered 2020-02-18 (×4): 10 meq via INTRAVENOUS
  Filled 2020-02-18 (×5): qty 100

## 2020-02-18 MED ORDER — SODIUM BICARBONATE 8.4 % IV SOLN
INTRAVENOUS | Status: AC
  Filled 2020-02-18: qty 150

## 2020-02-18 MED ORDER — FOLIC ACID 1 MG PO TABS: 1.0000 mg | ORAL_TABLET | Freq: Every day | ORAL | Status: DC

## 2020-02-18 MED ORDER — DEXTROSE 50 % IV SOLN
1.0000 | Freq: Once | INTRAVENOUS | Status: AC
  Administered 2020-02-18: 50 mL via INTRAVENOUS

## 2020-02-18 MED ORDER — ACETAMINOPHEN 325 MG PO TABS
650.0000 mg | ORAL_TABLET | ORAL | Status: DC | PRN
  Administered 2020-02-22 – 2020-02-24 (×2): 650 mg via ORAL
  Filled 2020-02-18 (×3): qty 2

## 2020-02-18 MED ORDER — ADULT MULTIVITAMIN W/MINERALS CH
1.0000 | ORAL_TABLET | Freq: Every day | ORAL | Status: DC
  Administered 2020-02-18: 1 via ORAL
  Filled 2020-02-18: qty 1

## 2020-02-18 MED ORDER — METHYLPREDNISOLONE SODIUM SUCC 125 MG IJ SOLR
125.0000 mg | Freq: Once | INTRAMUSCULAR | Status: AC
  Administered 2020-02-18: 125 mg via INTRAVENOUS
  Filled 2020-02-18: qty 2

## 2020-02-18 MED ORDER — POLYETHYLENE GLYCOL 3350 17 G PO PACK: 17.0000 g | PACK | Freq: Every day | ORAL | Status: DC | PRN

## 2020-02-18 MED ORDER — DIPHENHYDRAMINE HCL 50 MG/ML IJ SOLN
12.5000 mg | Freq: Once | INTRAMUSCULAR | Status: AC
  Administered 2020-02-18: 12.5 mg via INTRAVENOUS
  Filled 2020-02-18: qty 1

## 2020-02-18 MED ORDER — HEPARIN SODIUM (PORCINE) 5000 UNIT/ML IJ SOLN
5000.0000 [IU] | Freq: Three times a day (TID) | INTRAMUSCULAR | Status: DC
  Administered 2020-02-18 – 2020-02-26 (×24): 5000 [IU] via SUBCUTANEOUS
  Filled 2020-02-18 (×22): qty 1

## 2020-02-18 MED ORDER — DOCUSATE SODIUM 100 MG PO CAPS
100.0000 mg | ORAL_CAPSULE | Freq: Two times a day (BID) | ORAL | Status: DC | PRN
  Administered 2020-02-19: 100 mg via ORAL
  Filled 2020-02-18: qty 1

## 2020-02-18 MED ORDER — SODIUM CHLORIDE 0.9 % IV BOLUS
500.0000 mL | Freq: Once | INTRAVENOUS | Status: AC
  Administered 2020-02-18: 500 mL via INTRAVENOUS

## 2020-02-18 MED ORDER — POTASSIUM CHLORIDE CRYS ER 20 MEQ PO TBCR
40.0000 meq | EXTENDED_RELEASE_TABLET | Freq: Once | ORAL | Status: AC
  Administered 2020-02-18: 40 meq via ORAL
  Filled 2020-02-18: qty 2

## 2020-02-18 MED ORDER — VITAL HIGH PROTEIN PO LIQD
1000.0000 mL | ORAL | Status: DC
  Administered 2020-02-18: 1000 mL

## 2020-02-18 MED ORDER — POTASSIUM CHLORIDE 10 MEQ/100ML IV SOLN
10.0000 meq | Freq: Once | INTRAVENOUS | Status: AC
  Administered 2020-02-18: 10 meq via INTRAVENOUS
  Filled 2020-02-18: qty 100

## 2020-02-18 MED ORDER — THIAMINE HCL 100 MG PO TABS: 100.0000 mg | ORAL_TABLET | Freq: Every day | ORAL | Status: DC

## 2020-02-18 MED ORDER — POTASSIUM CHLORIDE 10 MEQ/100ML IV SOLN
10.0000 meq | INTRAVENOUS | Status: AC
  Administered 2020-02-18 – 2020-02-19 (×4): 10 meq via INTRAVENOUS
  Filled 2020-02-18 (×4): qty 100

## 2020-02-18 MED ORDER — LACTATED RINGERS IV BOLUS
500.0000 mL | Freq: Once | INTRAVENOUS | Status: AC
  Administered 2020-02-18: 500 mL via INTRAVENOUS

## 2020-02-18 MED ORDER — POTASSIUM CHLORIDE CRYS ER 20 MEQ PO TBCR: 40.0000 meq | EXTENDED_RELEASE_TABLET | Freq: Once | ORAL | Status: DC

## 2020-02-18 MED ORDER — THIAMINE HCL 100 MG/ML IJ SOLN
100.0000 mg | Freq: Once | INTRAMUSCULAR | Status: AC
  Administered 2020-02-18: 100 mg via INTRAVENOUS
  Filled 2020-02-18: qty 2

## 2020-02-18 MED ORDER — PROSOURCE TF PO LIQD
45.0000 mL | Freq: Two times a day (BID) | ORAL | Status: DC
  Administered 2020-02-18 – 2020-02-20 (×4): 45 mL
  Filled 2020-02-18 (×4): qty 45

## 2020-02-18 MED ORDER — MAGNESIUM SULFATE 4 GM/100ML IV SOLN
4.0000 g | Freq: Once | INTRAVENOUS | Status: AC
  Administered 2020-02-18: 4 g via INTRAVENOUS
  Filled 2020-02-18: qty 100

## 2020-02-18 NOTE — Plan of Care (Signed)
B-OH-butyrate elevated. Passed swallow study in ED but now sleeping after getting benadryl in the ED. Will place panda and start trickle TF. High risk for refeeding syndrome.  RN instructed to monitor for signs of anaphylaxis due to Magnesium. She was pre-medicated in ED.  Julian Hy, DO 02/18/20 6:51 PM [facility]  From 7AM- 7PM if no response to pager, please call [phone]. After hours, 7PM- 7AM, please call [place]  [phone].

## 2020-02-18 NOTE — ED Provider Notes (Signed)
Patient signed out to me by Dr. Roslynn Amble.  Briefly this is a 60 year old female with past medical history of anoxic brain injury, CHF, CKD, polysubstance abuse, schizoaffective disorder who presented to the emergency department reportedly under IVC for medical clearance and psychiatric evaluation.  History is limited.  I was called to bedside at the start of my shift as the patient became "unresponsive".  On arrival patient had a normal heart rate, normal blood pressure, oxygen saturation of 100% on room air with appropriate respirations.  Did not respond to sternal rub or painful stimuli, ammonia salts used and patient sat up, opened her eyes and then laid back down.  Low suspicion for opiate induced unresponsiveness given her regular respirations, could consider giving Narcan if needed.  Repeat fingerstick was 52, amp of D50 was ordered.  VBG resulted which shows acidosis with low CO2's compensation. Physical Exam  BP (!) 91/47   Pulse 80   Temp 98.2 F (36.8 C) (Oral)   Resp 15   SpO2 100%   Physical Exam  ED Course/Procedures     Procedures  MDM  Patient's magnesium also noted to be low, she has an anaphylactic reaction listed to magnesium containing supplements.  ICU has been consulted, they will admit the patient.  Patients evaluation and results requires admission for further treatment and care. Patient agrees with admission plan, offers no new complaints and is stable/unchanged at time of admit.       Lorelle Gibbs, DO 02/18/20 1388

## 2020-02-18 NOTE — ED Provider Notes (Signed)
Patient received in sign out from [facility] [place]. Walker Shadow, [place]. Patient arrived in ED for medical clearance for IVC. History of anoxic brain injury, asthma, CHF, CKD psychiatric history non-compliant with medications. During her stay in the ED, patient exhibiting increasing altered mental status. Screening labs indicate patient with AKI, metabolic acidosis, hypokalemia, hypoglycemic. Consult with nephrology has been completed--patient receiving potassium supplementation and bicarb drip. Patient also noted to have low magnesium level (0.9), but has documented anaphylaxis to magnesium containing compounds. Prolonged QTC on EKG. Consult with CCM ([place])--will be down to see patient.   Patient admitted to ICU by CCM.   Etta Quill, NP 02/18/20 1819    Lorelle Gibbs, DO 02/18/20 [phone]

## 2020-02-18 NOTE — ED Notes (Signed)
Blood gas ph is 7.076 per lab. Critical result called to MD. PCO2 <19.0.

## 2020-02-18 NOTE — ED Notes (Signed)
Pt changed into burgandy scrubs and wanded by security.  Belongings collected and stored at triage nurses station.
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
## 2020-02-18 NOTE — Progress Notes (Signed)
[facility] Progress Note Patient [name]: Tiffany [name] DOB: 08-04-1960 MRN: [id number]   Date of Service  02/18/2020  HPI/Events of Note  Patient temperature 95.3.  eICU Interventions  Bair hugger ordered.        Kerry Kass Tiffany [name] 02/18/2020, 9:17 PM

## 2020-02-18 NOTE — Progress Notes (Signed)
[facility] Progress Note Patient Name: Tiffany [name] DOB: 08/02/1960 MRN: [id number]   Date of Service  02/18/2020  HPI/Events of Note  Patient needs verification of Dobbhoff tube position in the stomach, and K+ replacement.  eICU Interventions  Dobbhoff tube position verified and K+ supplementation ordered.        Kerry Kass Kerah Hardebeck 02/18/2020, 10:07 PM

## 2020-02-18 NOTE — ED Notes (Signed)
K 2.6, PA Maria notified.

## 2020-02-18 NOTE — H&P (Signed)
NAME:  Tiffany [name], MRN:  [id number], DOB:  [birth year], LOS: 0 ADMISSION DATE:  02/18/2020, CONSULTATION DATE:  02/18/20 REFERRING MD:  Dina Rich, EDP, CHIEF COMPLAINT:  acidosis   Brief History:  Brought in for IVC for hallucinations and paranoid behavior, found to have severe metabolic acidosis and profound electrolyte abnormalities.  History of Present Illness:  Tiffany [name] is a 60 y/o woman with a history of schizoaffective disorder who was brought to the hospital for abnormal behavior due to need for inpatient psychiatric treatment. Per ED records she has been not eating for several days at home and has been barricading herself inside. She has not been taking her psychiatric medications. IVC initiated by APS. Her initial labs were significantly abnormal with AKI, hypokalemia, hypomagnesemia, incompletely compensated metabolic acidosis, hypoglycemia. She was evaluated in the ED and started on aggressive IVF resuscitation, and electrolyte repletion. [name] crying but unable to provide much history.  Past Medical History:  DM2 Stroke Seizure Schizoaffective disorder GERD OSA; previously with  HLD COPD Obesity, now with severe weight loss History of cocaine abuse CKD PEA cardiac arrest in 2018> was due to AE COPD  Significant Hospital Events:    Consults:  Nephrology   Procedures:    Significant Diagnostic Tests:    Micro Data:  covid negative  Antimicrobials:    Interim History / Subjective:    Objective   Blood pressure (!) 91/47, pulse 80, temperature 98.2 F (36.8 C), temperature source Oral, resp. rate 15, SpO2 100 %.        Intake/Output Summary (Last 24 hours) at 02/18/2020 1614 Last data filed at 02/18/2020 1556 Gross per 24 hour  Intake 500 ml  Output --  Net 500 ml   There were no vitals filed for this visit.  Examination: General: thin, frail appearing woman laying in bed in NAD HENT: [place]/AT, eyes anicteric, oral mucosa dry.  Edentulous. Lungs:  No tachypnea.  Clear to auscultation bilaterally.  Comfortably on room air. Cardiovascular: Tachycardic, regular rhythm Abdomen: ND, soft, nontender Extremities: No peripheral edema, no clubbing or cyanosis Neuro: Awake and alert, but answering questions Psych: Not argumentative or combative, crying throughout encounter Warm and dry  Resolved Hospital Problem list     Assessment & Plan:  Acute metabolic acidosis, high anion gap.  Etiology unknown.  Normal lactate. -Agree with plan for bicarb drip; will need aggressive potassium, magnesium, calcium repletion ahead of and concurrent with this. -Despite history of anaphylaxis to magnesium, this will be necessary to treat her other conditions without risk of more profound hypomagnesemia. Pre-medicate with solumedrol and benadryl. Discussed with nephrology, who agree. -checking B-OH-butyrate level to evaluate for starvation ketosis -hold unnecessary meds; unclear if she could have taken something at home we are unaware of. -appreciate Nephro's assistance- no indication for RRT currently. -needs ICU monitoring due to high risk of arrhythmias  AKI, suspect pre-renal -aggressive volume resuscitation -avoid nephrotoxic meds; hold NSAIDs -strict I/Os  Hypomagnesemia -repletion with pre-medication -recheck 9pm  Hypokalemia -IV & oral repletion -recheck 9pm  Hypoglycemia; mhistory of DM2 -1 amp d50W in ED -accuchecks  Malnutrition; history of significant weight loss -supplemental vitamins -will start TF if putting in NGT; otherwise can have reg diet  Acute metabolic encephalopathy superimposed on acute psychosis due to schizoaffective disorder -need to address metabolic disorders before resuming meds; high risk of complications of QTc prolongation with her electrolyte abnormalities  Leukopenia- potentially due to malnutrition -con't to monitor  Anemia- likely due to malnutrition -transfuse for  Hb< 7 or hemodynamically significant  bleeding -con't to monitor   Best practice (evaluated daily)  Diet: clear liquid Pain/Anxiety/Delirium protocol (if indicated): n/a VAP protocol (if indicated): n/a DVT prophylaxis: heparin [place] GI prophylaxis: n/a Glucose control: D50W PRN Mobility: bedrest Disposition:ICU  Goals of Care:  Last date of multidisciplinary goals of care discussion:no phone numbers for family in chart Family and staff present:  Summary of discussion:  Follow up goals of care discussion due:  Code Status: full  Labs   CBC: Recent Labs  Lab 02/18/20 1334  WBC 2.7*  NEUTROABS PENDING  HGB 10.2*  HCT 34.0*  MCV 92.9  PLT 474    Basic Metabolic Panel: Recent Labs  Lab 02/18/20 1334 02/18/20 1335  NA 138  --   K 2.6*  --   CL 109  --   CO2 13*  --   GLUCOSE 63*  --   BUN 39*  --   CREATININE 6.26*  --   CALCIUM 7.2*  --   MG  --  0.9*   GFR: CrCl cannot be calculated (Unknown ideal weight.). Recent Labs  Lab 02/18/20 1334 02/18/20 1440  WBC 2.7*  --   LATICACIDVEN  --  <0.3*    Liver Function Tests: Recent Labs  Lab 02/18/20 1334  AST 40  ALT 31  ALKPHOS 152*  BILITOT 0.8  PROT 6.8  ALBUMIN 3.4*   No results for input(s): LIPASE, AMYLASE in the last 168 hours. No results for input(s): AMMONIA in the last 168 hours.  ABG    Component Value Date/Time   PHART 7.273 (L) 10/27/2019 0400   PCO2ART 29.8 (L) 10/27/2019 0400   PO2ART 98.6 10/27/2019 0400   HCO3 4.8 (L) 02/18/2020 1455   TCO2 17 (L) 10/27/2019 0523   ACIDBASEDEF 23.4 (H) 02/18/2020 1455   O2SAT 85.8 02/18/2020 1455     Coagulation Profile: No results for input(s): INR, PROTIME in the last 168 hours.  Cardiac Enzymes: No results for input(s): CKTOTAL, CKMB, CKMBINDEX, TROPONINI in the last 168 hours.  HbA1C: HbA1c, POC (controlled diabetic range)  Date/Time Value Ref Range Status  10/11/2017 08:53 AM 5.3 0.0 - 7.0 % Final   Hgb A1c MFr Bld  Date/Time Value Ref Range Status  11/08/2018 05:43  PM 5.6 4.8 - 5.6 % Final    Comment:             Prediabetes: 5.7 - 6.4          Diabetes: >6.4          Glycemic control for adults with diabetes: <7.0   08/05/2018 05:48 PM 4.9 4.8 - 5.6 % Final    Comment:             Prediabetes: 5.7 - 6.4          Diabetes: >6.4          Glycemic control for adults with diabetes: <7.0     CBG: Recent Labs  Lab 02/18/20 1529  GLUCAP 52*    Review of Systems:   Limited due to patient's psychosis  Past Medical History:  She,  has a past medical history of Agitation (11/22/2017), Anoxic brain injury ([place]) (09/08/2016), Anxiety, Arthritis, Asthma (10/18/2010), Binge eating disorder, Cardiac arrest ([place]) (09/08/2016), Carotid artery stenosis, Chronic diastolic (congestive) heart failure ([place]), Chronic kidney disease, Chronic pain syndrome (06/18/2012), Chronic post-traumatic stress disorder (PTSD) (05/27/2018), Chronic respiratory failure with hypoxia and hypercapnia ([place]) (06/22/2015), CKD (chronic kidney disease) stage 3, GFR 30-59 ml/min (  [place]) (12/15/2016), Closed displaced fracture of fifth metacarpal bone (03/21/2018), Cocaine use disorder, severe, in sustained remission ([place]) (10/18/7251), Complication of anesthesia, COPD (chronic obstructive pulmonary disease) ([place]) (07/08/2014), Depression, Diabetic neuropathy ([place]) (04/24/2011), Difficulty with speech (01/24/2018), Disorder of nervous system, Drug abuse ([place]) (11/21/2017), Dyslipidemia (04/24/2011), Elevated troponin (04/28/2012), Emphysema, Encephalopathy (11/21/2017), Essential hypertension (03/22/2016), Fibula fracture (07/10/2016), Frequent falls (10/11/2017), GERD (gastroesophageal reflux disease), Gout (04/11/2017), Heart attack ([place]) (1980s), History of blood transfusion (1994), History of drug abuse in remission ([place]) (11/28/2015), Hyperlipidemia LDL goal <70, Incontinence, Manic depression ([place]), Morbid obesity ([place]) (10/18/2010), Obstructive sleep apnea (10/18/2010), On home oxygen therapy, OSA on CPAP, Paranoid  ([place]), Prolonged Q-T interval on ECG, Rectal bleeding (12/31/2015), Right carotid bruit (11/09/2016), Schizoaffective disorder, bipolar type ([place]) (04/05/2018), Seasonal allergies, Seborrheic keratoses (12/31/2013), Seizures ([name]), Stroke ([place]) (1980s), Thrush (09/19/2013), and Type 2 diabetes mellitus ([place]) (10/18/2010).   Surgical History:   Past Surgical History:  Procedure Laterality Date  . CESAREAN SECTION  1997  . HERNIA REPAIR    . IR CHOLANGIOGRAM EXISTING TUBE  07/20/2016  . IR PERC CHOLECYSTOSTOMY  05/10/2016  . IR RADIOLOGIST EVAL & MGMT  06/08/2016  . IR RADIOLOGIST EVAL & MGMT  06/29/2016  . IR SINUS/FIST TUBE CHK-NON GI  07/12/2016  . RIGHT/LEFT HEART CATH AND CORONARY ANGIOGRAPHY N/A 06/19/2017   Procedure: RIGHT/LEFT HEART CATH AND CORONARY ANGIOGRAPHY;  Surgeon: Jolaine Artist, MD;  Location: [facility];  Service: Cardiovascular;  Laterality: N/A;  . TIBIA IM NAIL INSERTION Right 07/12/2016   Procedure: INTRAMEDULLARY (IM) NAIL RIGHT TIBIA;  Surgeon: Leandrew Koyanagi, MD;  Location: [facility];  Service: Orthopedics;  Laterality: Right;  . UMBILICAL HERNIA REPAIR  ~ 1963   "that's why I don't have a belly button"  . VAGINAL HYSTERECTOMY       Social History:   reports that she quit smoking about 3 years ago. Her smoking use included cigarettes. She started smoking about 42 years ago. She has a 57.00 pack-year smoking history. She has never used smokeless tobacco. She reports that she does not drink alcohol and does not use drugs.   Family History:  Her family history includes Anxiety disorder in her sister; Bipolar disorder in her sister; Cancer in her father and mother; Depression in her brother, mother, sister, and sister; Heart failure in an other family member; Schizophrenia in her sister.   Allergies Allergies  Allergen Reactions  . Hydrocodone Shortness Of Breath  . Hydrocodone-Acetaminophen Shortness Of Breath  . Hydroxyzine Anaphylaxis and Shortness Of Breath  .  Latuda [Lurasidone Hcl] Anaphylaxis  . Lurasidone Anaphylaxis  . Magnesium-Containing Compounds Anaphylaxis  . Prednisone Anaphylaxis, Swelling and Other (See Comments)    Tongue swelling  . Tramadol Anaphylaxis and Swelling  . Codeine Nausea And Vomiting  . Other Rash  . Sulfa Antibiotics Itching  . Tape Rash     Home Medications  Prior to Admission medications   Medication Sig Start Date End Date Taking? Authorizing Provider  potassium chloride SA (KLOR-CON) 20 MEQ tablet TAKE 2 TABLETS BY MOUTH TWICE DAILY 02/18/20   [name], MD  albuterol (PROVENTIL) (2.5 MG/3ML) 0.083% nebulizer solution Take 3 mLs (2.5 mg total) by nebulization every 6 (six) hours as needed for wheezing or shortness of breath. 04/29/19   Magdalen Spatz, NP  albuterol (VENTOLIN HFA) 108 (90 Base) MCG/ACT inhaler Inhale 2 puffs into the lungs every 6 (six) hours as needed for wheezing or shortness of breath. 11/25/19   Mayers, Johnette Abraham  S, PA-C  atorvastatin (LIPITOR) 80 MG tablet Take 1 tablet (80 mg total) by mouth daily. 01/01/20   [name], MD  Blood Pressure Monitoring (BLOOD PRESSURE CUFF) MISC 1 Units by Does not apply route daily. 11/26/19   Mayers, Cari S, PA-C  colchicine 0.6 MG tablet Take 1 tablet (0.6 mg total) by mouth 2 (two) times daily. 01/01/20   [name], MD  diazepam (VALIUM) 10 MG tablet Take 1 tablet (10 mg total) by mouth 3 (three) times daily as needed for anxiety. 02/09/20 04/09/20  Pucilowski, Marchia Bond, MD  diclofenac (VOLTAREN) 75 MG EC tablet Take 1 tablet (75 mg total) by mouth 2 (two) times daily. 12/29/19   [name], MD  diclofenac Sodium (VOLTAREN) 1 % GEL Apply 2 g topically 4 (four) times daily. 09/10/19   [name], DO  Elastic Bandages & Supports (MEDICAL COMPRESSION STOCKINGS) MISC 1 Units by Does not apply route daily. 11/26/19   Mayers, Cari S, PA-C  escitalopram (LEXAPRO) 20 MG tablet Take 2 tablets (40 mg total) by mouth daily. 01/05/20 04/04/20   Pucilowski, Marchia Bond, MD  febuxostat (ULORIC) 40 MG tablet Take 1 tablet (40 mg total) by mouth daily. 11/12/19   Fulp, Cammie, MD  ferrous sulfate 325 (65 FE) MG tablet Take 1 tablet (325 mg total) by mouth daily with breakfast. 11/02/19   Florencia Reasons, MD  Fluticasone-Umeclidin-Vilant (TRELEGY ELLIPTA) 100-62.5-25 MCG/INH AEPB Inhale 1 puff into the lungs daily. 11/28/19   Collene Gobble, MD  gabapentin (NEURONTIN) 600 MG tablet Take 1 tablet (600 mg total) by mouth 2 (two) times daily. 02/04/20   Pucilowski, Olgierd A, MD  lidocaine (LIDODERM) 5 % Place 1 patch onto the skin daily. 01/13/20   [name], MD  lisdexamfetamine (VYVANSE) 70 MG capsule Take 1 capsule (70 mg total) by mouth daily before breakfast. 02/04/20 03/05/20  Pucilowski, Marchia Bond, MD  montelukast (SINGULAIR) 10 MG tablet Take 1 tablet (10 mg total) by mouth at bedtime. 11/28/19   Collene Gobble, MD  Multiple Vitamin (MULTIVITAMIN WITH MINERALS) TABS tablet Take 1 tablet by mouth daily.    [provider]  omeprazole (PRILOSEC) 40 MG capsule Take 1 capsule (40 mg total) by mouth daily. 12/29/19   [name], MD  oxybutynin (DITROPAN) 5 MG tablet Take 1 tablet (5 mg total) by mouth 2 (two) times daily. 12/29/19   [name], MD  prazosin (MINIPRESS) 5 MG capsule TAKE 1 CAPSULE BY MOUTH TWICE DAILY 12/31/19 03/30/20  Pucilowski, Marchia Bond, MD  saccharomyces boulardii (FLORASTOR) 250 MG capsule Take 1 capsule (250 mg total) by mouth 2 (two) times daily. 11/06/19   Yu, Amy V, PA-C  SAPHRIS 10 MG SUBL Place 1 tablet (10 mg total) under the tongue in the morning, at noon, and at bedtime. 02/04/20 05/04/20  Pucilowski, Marchia Bond, MD  tiZANidine (ZANAFLEX) 4 MG tablet Take 1 tablet (4 mg total) by mouth every 8 (eight) hours as needed for muscle spasms. 01/01/20   [name], MD  traZODone (DESYREL) 100 MG tablet Take 0.5 tablet (50 mg) in am and one tablet (100 mg) at bedtime 01/13/20   Pucilowski, Olgierd A, MD   budesonide-formoterol (SYMBICORT) 80-4.5 MCG/ACT inhaler Inhale 2 puffs into the lungs 2 (two) times daily. Patient not taking: Reported on 09/29/2019 11/15/18 09/29/19  Maximiano Coss, NP     Critical care time: 45 min.    Julian Hy, DO 02/18/20 5:08 PM [facility]  From  7AM- 7PM if no response to pager, please call [phone]. After hours, 7PM- 7AM, please call Elink  [phone].

## 2020-02-18 NOTE — ED Notes (Signed)
Pt's belongings (1 bag) placed in cabinet behind nurse's station in 19-22 section.

## 2020-02-18 NOTE — ED Notes (Addendum)
Assigned to pt as sitter.

## 2020-02-18 NOTE — Consult Note (Addendum)
Reason for Consult: AKI Referring Physician: Horton, DO  Tiffany [name] is an 60 y.o. female has a PMH significant for HTN, DM type 2, schizoaffective disorder, bipolar type (vs paranoid schizophrenia), chronic PTSD, medication noncompliance, h/o cocaine abuse and CKD stage 3b who presented to [facility] ED via EMS with paranoid delusions about her family breaking in and stealing things.  In the ED she became more lethargic and confused.  She was hypotensive and given IVF's.  Labs were notable for WBC of 2.7, Hgb 10.2, K 2.6, CO2 13, Ca 7.2, BUN 39, Cr 6.26, Na 138, alb 3.4.  Venous blood gas pH 7.076, CO2 <18, bicarb 4.8, salicylate <7, acetaminophen <10.  We were consulted to further evaluate and manage her AKI/CKD stage 3b and electrolyte abnormalities.   [name] currently nonverbal but did scream out when an IV was placed.  She also has a history of AKI/CKD in October when she developed C diff colitis.  No family is present in the ED so this HPI was obtained via review of EMS.  She did have a telehealth visit yesterday with her Psychiatrist without any significant complaints.  She was also started on NSAIDs last month but cannot obtain a complete medlist in her current state.  Trend in Creatinine: Creatinine, Ser  Date/Time Value Ref Range Status  02/18/2020 01:34 PM 6.26 (H) 0.44 - 1.00 mg/dL Final  11/25/2019 04:42 PM 1.22 (H) 0.57 - 1.00 mg/dL Final  11/02/2019 05:08 AM 1.22 (H) 0.44 - 1.00 mg/dL Final  11/01/2019 05:58 AM 1.27 (H) 0.44 - 1.00 mg/dL Final  10/31/2019 04:20 AM 1.19 (H) 0.44 - 1.00 mg/dL Final  10/30/2019 08:20 AM 1.35 (H) 0.44 - 1.00 mg/dL Final  10/29/2019 06:31 AM 1.55 (H) 0.44 - 1.00 mg/dL Final  10/28/2019 04:52 AM 2.26 (H) 0.44 - 1.00 mg/dL Final  10/28/2019 04:52 AM 2.19 (H) 0.44 - 1.00 mg/dL Final  10/27/2019 10:41 AM 3.27 (H) 0.44 - 1.00 mg/dL Final  10/27/2019 05:23 AM 3.90 (H) 0.44 - 1.00 mg/dL Final  10/27/2019 05:19 AM 4.10 (H) 0.44 - 1.00 mg/dL Final  10/27/2019  03:58 AM 3.95 (H) 0.44 - 1.00 mg/dL Final  09/28/2019 01:42 PM 2.23 (H) 0.44 - 1.00 mg/dL Final  03/30/2019 01:18 AM 1.48 (H) 0.44 - 1.00 mg/dL Final  03/21/2019 05:04 AM 1.30 (H) 0.44 - 1.00 mg/dL Final  02/03/2019 02:08 PM 1.53 (H) 0.44 - 1.00 mg/dL Final  11/08/2018 05:43 PM 1.39 (H) 0.57 - 1.00 mg/dL Final  10/22/2018 05:01 PM 1.39 (H) 0.44 - 1.00 mg/dL Final  10/19/2018 03:00 AM 1.80 (H) 0.44 - 1.00 mg/dL Final  10/19/2018 02:57 AM 1.86 (H) 0.44 - 1.00 mg/dL Final  10/07/2018 07:51 AM 1.82 (H) 0.44 - 1.00 mg/dL Final  10/06/2018 10:22 PM 2.18 (H) 0.44 - 1.00 mg/dL Final  10/02/2018 05:47 AM 1.25 (H) 0.44 - 1.00 mg/dL Final  08/05/2018 05:48 PM 1.27 (H) 0.57 - 1.00 mg/dL Final  02/27/2018 01:05 PM 2.38 (H) 0.44 - 1.00 mg/dL Final  12/25/2017 12:20 PM 2.19 (H) 0.44 - 1.00 mg/dL Final  11/27/2017 02:44 PM 1.54 (H) 0.57 - 1.00 mg/dL Final  11/23/2017 05:34 AM 1.92 (H) 0.44 - 1.00 mg/dL Final  11/22/2017 02:53 AM 3.49 (H) 0.44 - 1.00 mg/dL Final  11/21/2017 07:47 PM 4.14 (H) 0.44 - 1.00 mg/dL Final  10/17/2017 01:56 PM 1.64 (H) 0.57 - 1.00 mg/dL Final  10/11/2017 09:25 AM 1.71 (H) 0.57 - 1.00 mg/dL Final  07/23/2017 02:50 PM 1.63 (H) 0.57 - 1.00  mg/dL Final  07/09/2017 12:25 PM 2.58 (H) 0.57 - 1.00 mg/dL Final  06/25/2017 04:26 PM 2.35 (H) 0.57 - 1.00 mg/dL Final  06/19/2017 01:02 PM 1.50 (H) 0.44 - 1.00 mg/dL Final  06/19/2017 11:38 AM 1.82 (H) 0.44 - 1.00 mg/dL Final  06/13/2017 04:55 PM 2.33 (H) 0.44 - 1.00 mg/dL Final  05/12/2017 07:32 PM 1.23 (H) 0.44 - 1.00 mg/dL Final  03/28/2017 01:32 PM 1.63 (H) 0.44 - 1.00 mg/dL Final  03/12/2017 03:00 PM 1.80 (H) 0.57 - 1.00 mg/dL Final  03/08/2017 03:45 PM 1.39 (H) 0.44 - 1.00 mg/dL Final  02/23/2017 12:00 PM 1.52 (H) 0.57 - 1.00 mg/dL Final  01/25/2017 03:37 PM 1.55 (H) 0.44 - 1.00 mg/dL Final  01/10/2017 04:18 PM 1.33 (H) 0.57 - 1.00 mg/dL Final  01/07/2017 08:42 AM 1.46 (H) 0.44 - 1.00 mg/dL Final  01/06/2017 09:39 PM 1.54 (H) 0.44 -  1.00 mg/dL Final  01/06/2017 05:08 AM 1.61 (H) 0.44 - 1.00 mg/dL Final  01/06/2017 12:46 AM 1.65 (H) 0.44 - 1.00 mg/dL Final  12/20/2016 01:55 PM 1.52 (H) 0.44 - 1.00 mg/dL Final  12/14/2016 03:11 PM 1.81 (H) 0.44 - 1.00 mg/dL Final    PMH:   Past Medical History:  Diagnosis Date  . Agitation 11/22/2017  . Anoxic brain injury ([place]) 09/08/2016   C. Arrest due to respiratory failure and COPD exacerbation  . Anxiety   . Arthritis    "all over" (04/10/2016)  . Asthma 10/18/2010  . Binge eating disorder   . Cardiac arrest ([place]) 09/08/2016   PEA  . Carotid artery stenosis    1-39% bilateral by dopplers 11/2016  . Chronic diastolic (congestive) heart failure ([place])   . Chronic kidney disease    "I see a kidney dr." (04/10/2016)  . Chronic pain syndrome 06/18/2012  . Chronic post-traumatic stress disorder (PTSD) 05/27/2018  . Chronic respiratory failure with hypoxia and hypercapnia (HCC) 06/22/2015   TRILOGY Vent >AVAPA-ES., Vt target 200-400, Max P 30 , PS max 20 , PS min 6-10 , E Max 6, E Min 4, Rate Auto AVAPS Rate 2 (titrate for pt comfort) , bleed O2 at 5l/m continuous flow .   [name] CKD (chronic kidney disease) stage 3, GFR 30-59 ml/min (HCC) 12/15/2016  . Closed displaced fracture of fifth metacarpal bone 03/21/2018  . Cocaine use disorder, severe, in sustained remission ([place]) 12/17/2015  . Complication of anesthesia    decreased bp, decreased heart rate  . COPD (chronic obstructive pulmonary disease) ([place]) 07/08/2014  . Depression   . Diabetic neuropathy ([place]) 04/24/2011  . Difficulty with speech 01/24/2018  . Disorder of nervous system   . Drug abuse ([place]) 11/21/2017  . Dyslipidemia 04/24/2011  . Elevated troponin 04/28/2012  . Emphysema   . Encephalopathy 11/21/2017  . Essential hypertension 03/22/2016  . Fibula fracture 07/10/2016  . Frequent falls 10/11/2017  . GERD (gastroesophageal reflux disease)   . Gout 04/11/2017  . Heart attack ([place]) 1980s  . History of blood transfusion 1994   "couldn't  stop bleeding from my period"  . History of drug abuse in remission ([place]) 11/28/2015   Quit in 2017  . Hyperlipidemia LDL goal <70   . Incontinence   . Manic depression ([place])   . Morbid obesity ([place]) 10/18/2010  . Obstructive sleep apnea 10/18/2010  . On home oxygen therapy    "6L; 24/7" (04/10/2016)  . OSA on CPAP    "wear mask sometimes" (04/10/2016)  . Paranoid ([place])    "sometimes; I'm on  RX for it" (04/10/2016)  . Prolonged Q-T interval on ECG   . Rectal bleeding 12/31/2015  . Right carotid bruit 11/09/2016  . Schizoaffective disorder, bipolar type ([place]) 04/05/2018  . Seasonal allergies   . Seborrheic keratoses 12/31/2013  . Seizures ([place])    "don't know what kind; last one was ~ 1 yr ago" (04/10/2016)  . Stroke [facility]) 1980s   denies residual on 04/10/2016  . Thrush 09/19/2013  . Type 2 diabetes mellitus ([place]) 10/18/2010    PSH:   Past Surgical History:  Procedure Laterality Date  . CESAREAN SECTION  1997  . HERNIA REPAIR    . IR CHOLANGIOGRAM EXISTING TUBE  07/20/2016  . IR PERC CHOLECYSTOSTOMY  05/10/2016  . IR RADIOLOGIST EVAL & MGMT  06/08/2016  . IR RADIOLOGIST EVAL & MGMT  06/29/2016  . IR SINUS/FIST TUBE CHK-NON GI  07/12/2016  . RIGHT/LEFT HEART CATH AND CORONARY ANGIOGRAPHY N/A 06/19/2017   Procedure: RIGHT/LEFT HEART CATH AND CORONARY ANGIOGRAPHY;  Surgeon: Jolaine Artist, MD;  Location: [facility];  Service: Cardiovascular;  Laterality: N/A;  . TIBIA IM NAIL INSERTION Right 07/12/2016   Procedure: INTRAMEDULLARY (IM) NAIL RIGHT TIBIA;  Surgeon: Leandrew Koyanagi, MD;  Location: [facility];  Service: Orthopedics;  Laterality: Right;  . UMBILICAL HERNIA REPAIR  ~ 1963   "that's why I don't have a belly button"  . VAGINAL HYSTERECTOMY      Allergies:  Allergies  Allergen Reactions  . Hydrocodone Shortness Of Breath  . Hydrocodone-Acetaminophen Shortness Of Breath  . Hydroxyzine Anaphylaxis and Shortness Of Breath  . Latuda [Lurasidone Hcl] Anaphylaxis  . Lurasidone  Anaphylaxis  . Magnesium-Containing Compounds Anaphylaxis  . Prednisone Anaphylaxis, Swelling and Other (See Comments)    Tongue swelling  . Tramadol Anaphylaxis and Swelling  . Codeine Nausea And Vomiting  . Other Rash  . Sulfa Antibiotics Itching  . Tape Rash    Medications:   Prior to Admission medications   Medication Sig Start Date End Date Taking? Authorizing Provider  potassium chloride SA (KLOR-CON) 20 MEQ tablet TAKE 2 TABLETS BY MOUTH TWICE DAILY 02/18/20   [name], MD  albuterol (PROVENTIL) (2.5 MG/3ML) 0.083% nebulizer solution Take 3 mLs (2.5 mg total) by nebulization every 6 (six) hours as needed for wheezing or shortness of breath. 04/29/19   Magdalen Spatz, NP  albuterol (VENTOLIN HFA) 108 (90 Base) MCG/ACT inhaler Inhale 2 puffs into the lungs every 6 (six) hours as needed for wheezing or shortness of breath. 11/25/19   Mayers, Cari S, PA-C  atorvastatin (LIPITOR) 80 MG tablet Take 1 tablet (80 mg total) by mouth daily. 01/01/20   [name], MD  Blood Pressure Monitoring (BLOOD PRESSURE CUFF) MISC 1 Units by Does not apply route daily. 11/26/19   Mayers, Cari S, PA-C  colchicine 0.6 MG tablet Take 1 tablet (0.6 mg total) by mouth 2 (two) times daily. 01/01/20   [name], MD  diazepam (VALIUM) 10 MG tablet Take 1 tablet (10 mg total) by mouth 3 (three) times daily as needed for anxiety. 02/09/20 04/09/20  Pucilowski, Marchia Bond, MD  diclofenac (VOLTAREN) 75 MG EC tablet Take 1 tablet (75 mg total) by mouth 2 (two) times daily. 12/29/19   [name], MD  diclofenac Sodium (VOLTAREN) 1 % GEL Apply 2 g topically 4 (four) times daily. 09/10/19   [name], DO  Elastic Bandages & Supports (MEDICAL COMPRESSION STOCKINGS) MISC 1 Units by Does not apply route daily. 11/26/19  Mayers, Cari S, PA-C  escitalopram (LEXAPRO) 20 MG tablet Take 2 tablets (40 mg total) by mouth daily. 01/05/20 04/04/20  Pucilowski, Marchia Bond, MD  febuxostat (ULORIC) 40 MG  tablet Take 1 tablet (40 mg total) by mouth daily. 11/12/19   Fulp, Cammie, MD  ferrous sulfate 325 (65 FE) MG tablet Take 1 tablet (325 mg total) by mouth daily with breakfast. 11/02/19   Florencia Reasons, MD  Fluticasone-Umeclidin-Vilant (TRELEGY ELLIPTA) 100-62.5-25 MCG/INH AEPB Inhale 1 puff into the lungs daily. 11/28/19   Collene Gobble, MD  gabapentin (NEURONTIN) 600 MG tablet Take 1 tablet (600 mg total) by mouth 2 (two) times daily. 02/04/20   Pucilowski, Olgierd A, MD  lidocaine (LIDODERM) 5 % Place 1 patch onto the skin daily. 01/13/20   [name], MD  lisdexamfetamine (VYVANSE) 70 MG capsule Take 1 capsule (70 mg total) by mouth daily before breakfast. 02/04/20 03/05/20  Pucilowski, Marchia Bond, MD  montelukast (SINGULAIR) 10 MG tablet Take 1 tablet (10 mg total) by mouth at bedtime. 11/28/19   Collene Gobble, MD  Multiple Vitamin (MULTIVITAMIN WITH MINERALS) TABS tablet Take 1 tablet by mouth daily.    [provider]  omeprazole (PRILOSEC) 40 MG capsule Take 1 capsule (40 mg total) by mouth daily. 12/29/19   [name], MD  oxybutynin (DITROPAN) 5 MG tablet Take 1 tablet (5 mg total) by mouth 2 (two) times daily. 12/29/19   [name], MD  prazosin (MINIPRESS) 5 MG capsule TAKE 1 CAPSULE BY MOUTH TWICE DAILY 12/31/19 03/30/20  Pucilowski, Marchia Bond, MD  saccharomyces boulardii (FLORASTOR) 250 MG capsule Take 1 capsule (250 mg total) by mouth 2 (two) times daily. 11/06/19   Yu, Amy V, PA-C  SAPHRIS 10 MG SUBL Place 1 tablet (10 mg total) under the tongue in the morning, at noon, and at bedtime. 02/04/20 05/04/20  Pucilowski, Marchia Bond, MD  tiZANidine (ZANAFLEX) 4 MG tablet Take 1 tablet (4 mg total) by mouth every 8 (eight) hours as needed for muscle spasms. 01/01/20   [name], MD  traZODone (DESYREL) 100 MG tablet Take 0.5 tablet (50 mg) in am and one tablet (100 mg) at bedtime 01/13/20   Pucilowski, Olgierd A, MD  budesonide-formoterol (SYMBICORT) 80-4.5 MCG/ACT  inhaler Inhale 2 puffs into the lungs 2 (two) times daily. Patient not taking: Reported on 09/29/2019 11/15/18 09/29/19  Maximiano Coss, NP    Inpatient medications: . dextrose  1 ampule Intravenous Once  . potassium chloride  40 mEq Oral Once    Discontinued Meds:  There are no discontinued medications.  Social History:  reports that she quit smoking about 3 years ago. Her smoking use included cigarettes. She started smoking about 42 years ago. She has a 57.00 pack-year smoking history. She has never used smokeless tobacco. She reports that she does not drink alcohol and does not use drugs.  Family History:   Family History  Problem Relation Age of Onset  . Cancer Father        prostate  . Cancer Mother        lung  . Depression Mother   . Depression Sister   . Anxiety disorder Sister   . Schizophrenia Sister   . Bipolar disorder Sister   . Depression Sister   . Depression Brother   . Heart failure Other        cousin    Review of systems not obtained due to patient factors. Weight change:  No intake or output data in  the 24 hours ending 02/18/20 1548 BP (!) 91/47   Pulse 80   Temp 98.2 F (36.8 C) (Oral)   Resp 15   SpO2 100%  Vitals:   02/18/20 1333 02/18/20 1419 02/18/20 1420 02/18/20 1430  BP: (!) 101/56 (!) 95/51  (!) 91/47  Pulse:   81 80  Resp:   17 15  Temp: 98.2 F (36.8 C)     TempSrc: Oral     SpO2:   98% 100%     General appearance: uncooperative and minimally responsive Head: Normocephalic, without obvious abnormality, atraumatic Resp: clear to auscultation bilaterally Cardio: regular rate and rhythm, S1, S2 normal, no murmur, click, rub or gallop and no rub GI: soft, non-tender; bowel sounds normal; no masses,  no organomegaly Extremities: extremities normal, atraumatic, no cyanosis or edema  Labs: Basic Metabolic Panel: Recent Labs  Lab 02/18/20 1334  NA 138  K 2.6*  CL 109  CO2 13*  GLUCOSE 63*  BUN 39*  CREATININE 6.26*  ALBUMIN  3.4*  CALCIUM 7.2*   Liver Function Tests: Recent Labs  Lab 02/18/20 1334  AST 40  ALT 31  ALKPHOS 152*  BILITOT 0.8  PROT 6.8  ALBUMIN 3.4*   No results for input(s): LIPASE, AMYLASE in the last 168 hours. No results for input(s): AMMONIA in the last 168 hours. CBC: Recent Labs  Lab 02/18/20 1334  WBC 2.7*  NEUTROABS PENDING  HGB 10.2*  HCT 34.0*  MCV 92.9  PLT 325   PT/INR: @LABRCNTIP (inr:5) Cardiac Enzymes: )No results for input(s): CKTOTAL, CKMB, CKMBINDEX, TROPONINI in the last 168 hours. CBG: Recent Labs  Lab 02/18/20 1529  GLUCAP 52*    Iron Studies: No results for input(s): IRON, TIBC, TRANSFERRIN, FERRITIN in the last 168 hours.  Xrays/Other Studies: DG Chest Portable 1 View  Result Date: 02/18/2020 CLINICAL DATA:  61 year old female with concern for infection. EXAM: PORTABLE CHEST 1 VIEW COMPARISON:  Chest radiograph dated 10/27/2019. FINDINGS: No focal consolidation, pleural effusion or pneumothorax. Mild cardiomegaly. No acute osseous pathology. Degenerative changes of the spine. IMPRESSION: No active disease. Electronically Signed   By: Anner Crete M.D.   On: 02/18/2020 15:23     Assessment/Plan: 1.  AKI/CKD stage 3b- unable to obtain a complete history about her presentation.  Telehealth visit with Psych reported only arthralgias, gait problem, and anxiety, no mention of N/V/D or poor po intake.  Given hypotension this is likely volume depletion +/- NSAID use as source of AKI.   1. Agree with IVF's, lactated ringers and follow UOP and Scr. 2. Renal US to r/o obstruction 3. Urine studies ordered. 4. No urgent indication for dialysis at this time and will follow response to IVF's closely. 2. Hypokalemia- will need to be repleted before replacing bicarb as this will exacerbate the issue.  Telemetry and follow labs. 3. Anion gap acidosis- presumably due to #1.  Salicylate levels normal.  Start LR IVF after potassium repleted.  Lactate <0.3 so agree  with Dr. Carlis Abbott may be a component of starvation ketosis (hypokalemia, hypomagnesemia, hypocalcemia, and significant weight loss ~10 kg since July 2021).   4. Hypotension- presumably due to volume depletion.  Follow after IVF's. 5. AMS- possibly acute metabolic encephalopathy but will also order urine drug screen.  Also had low glucose and would give 1 amp of D50 and follow. 6. Schizoaffective disorder (bipolar type or paranoid schizophrenia)- recommend psych eval. 7. Hypocalcemia- recommend IV calcium chloride as this will worsen with bicarb.  Will check vit  D levels. 8. Hypomagnesemia- will need IV magnesium once BP is stable, 2 grams.   Governor Rooks Hank Walling 02/18/2020, 3:48 PM

## 2020-02-18 NOTE — ED Notes (Addendum)
Pt does not have a key to get back into her apartment, please call Anderson Malta at [facility] before patient is sent back . She does not have her key to get back in. 02/18/2020 Anderson Malta [phone].
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
## 2020-02-18 NOTE — ED Notes (Signed)
Critical magnesium level of 0.9 called to Dr. Dina Rich. RN made aware.

## 2020-02-19 ENCOUNTER — Other Ambulatory Visit: Payer: Self-pay

## 2020-02-19 ENCOUNTER — Ambulatory Visit: Admission: RE | Admit: 2020-02-19 | Payer: 59 | Source: Ambulatory Visit

## 2020-02-19 DIAGNOSIS — E876 Hypokalemia: Secondary | ICD-10-CM | POA: Diagnosis not present

## 2020-02-19 DIAGNOSIS — N179 Acute kidney failure, unspecified: Secondary | ICD-10-CM | POA: Diagnosis not present

## 2020-02-19 DIAGNOSIS — G9341 Metabolic encephalopathy: Secondary | ICD-10-CM | POA: Diagnosis not present

## 2020-02-19 LAB — BASIC METABOLIC PANEL
Anion gap: 15 (ref 5–15)
Anion gap: 11 (ref 5–15)
BUN: 41 mg/dL — ABNORMAL HIGH (ref 6–20)
BUN: 48 mg/dL — ABNORMAL HIGH (ref 6–20)
CO2: 17 mmol/L — ABNORMAL LOW (ref 22–32)
CO2: 19 mmol/L — ABNORMAL LOW (ref 22–32)
Calcium: 6.7 mg/dL — ABNORMAL LOW (ref 8.9–10.3)
Calcium: 7.2 mg/dL — ABNORMAL LOW (ref 8.9–10.3)
Chloride: 103 mmol/L (ref 98–111)
Chloride: 105 mmol/L (ref 98–111)
Creatinine, Ser: 5.57 mg/dL — ABNORMAL HIGH (ref 0.44–1.00)
Creatinine, Ser: 5.39 mg/dL — ABNORMAL HIGH (ref 0.44–1.00)
GFR, Estimated: 8 mL/min — ABNORMAL LOW (ref >60)
GFR, Estimated: 9 mL/min — ABNORMAL LOW (ref >60)
Glucose, Bld: 137 mg/dL — ABNORMAL HIGH (ref 70–99)
Glucose, Bld: 119 mg/dL — ABNORMAL HIGH (ref 70–99)
Potassium: 3.2 mmol/L — ABNORMAL LOW (ref 3.5–5.1)
Potassium: 3.8 mmol/L (ref 3.5–5.1)
Sodium: 135 mmol/L (ref 135–145)
Sodium: 135 mmol/L (ref 135–145)

## 2020-02-19 LAB — RENAL FUNCTION PANEL
Albumin: 2.8 g/dL — ABNORMAL LOW (ref 3.5–5.0)
Anion gap: 14 (ref 5–15)
BUN: 40 mg/dL — ABNORMAL HIGH (ref 6–20)
CO2: 16 mmol/L — ABNORMAL LOW (ref 22–32)
Calcium: 6.6 mg/dL — ABNORMAL LOW (ref 8.9–10.3)
Chloride: 103 mmol/L (ref 98–111)
Creatinine, Ser: 5.56 mg/dL — ABNORMAL HIGH (ref 0.44–1.00)
GFR, Estimated: 8 mL/min — ABNORMAL LOW (ref >60)
Glucose, Bld: 135 mg/dL — ABNORMAL HIGH (ref 70–99)
Phosphorus: 4.7 mg/dL — ABNORMAL HIGH (ref 2.5–4.6)
Potassium: 3.1 mmol/L — ABNORMAL LOW (ref 3.5–5.1)
Sodium: 133 mmol/L — ABNORMAL LOW (ref 135–145)

## 2020-02-19 LAB — URINALYSIS, ROUTINE W REFLEX MICROSCOPIC
Bilirubin Urine: NEGATIVE
Glucose, UA: NEGATIVE mg/dL
Ketones, ur: 5 mg/dL — AB
Nitrite: NEGATIVE
Protein, ur: 100 mg/dL — AB
Specific Gravity, Urine: 1.015 (ref 1.005–1.030)
pH: 5 (ref 5.0–8.0)

## 2020-02-19 LAB — RAPID URINE DRUG SCREEN, HOSP PERFORMED
Amphetamines: POSITIVE — AB
Barbiturates: NOT DETECTED
Benzodiazepines: POSITIVE — AB
Cocaine: NOT DETECTED
Opiates: NOT DETECTED
Tetrahydrocannabinol: NOT DETECTED

## 2020-02-19 LAB — OSMOLALITY, URINE: Osmolality, Ur: 300 mOsm/kg (ref 300–900)

## 2020-02-19 LAB — BLOOD GAS, ARTERIAL
Acid-base deficit: 6.3 mmol/L — ABNORMAL HIGH (ref 0.0–2.0)
Bicarbonate: 18.7 mmol/L — ABNORMAL LOW (ref 20.0–28.0)
FIO2: 21
O2 Saturation: 97.1 %
Patient temperature: 98.6
pCO2 arterial: 37.4 mmHg (ref 32.0–48.0)
pH, Arterial: 7.319 — ABNORMAL LOW (ref 7.350–7.450)
pO2, Arterial: 90.8 mmHg (ref 83.0–108.0)

## 2020-02-19 LAB — VOLATILES,BLD-ACETONE,ETHANOL,ISOPROP,METHANOL
Acetone, blood: <0.01 g/dL (ref 0.000–0.010)
Ethanol, blood: <0.01 g/dL (ref 0.000–0.010)
Isopropanol, blood: <0.01 g/dL (ref 0.000–0.010)
Methanol, blood: <0.01 g/dL (ref 0.000–0.010)

## 2020-02-19 LAB — MAGNESIUM: Magnesium: 1.8 mg/dL (ref 1.7–2.4)

## 2020-02-19 LAB — GLUCOSE, CAPILLARY
Glucose-Capillary: 116 mg/dL — ABNORMAL HIGH (ref 70–99)
Glucose-Capillary: 124 mg/dL — ABNORMAL HIGH (ref 70–99)

## 2020-02-19 LAB — CBC
HCT: 28.9 % — ABNORMAL LOW (ref 36.0–46.0)
Hemoglobin: 9.1 g/dL — ABNORMAL LOW (ref 12.0–15.0)
MCH: 28.3 pg (ref 26.0–34.0)
MCHC: 31.5 g/dL (ref 30.0–36.0)
MCV: 90 fL (ref 80.0–100.0)
Platelets: 299 10*3/uL (ref 150–400)
RBC: 3.21 MIL/uL — ABNORMAL LOW (ref 3.87–5.11)
RDW: 13.8 % (ref 11.5–15.5)
WBC: 2.8 10*3/uL — ABNORMAL LOW (ref 4.0–10.5)
nRBC: 0 % (ref 0.0–0.2)

## 2020-02-19 LAB — ETHYLENE GLYCOL: Ethylene Glycol Lvl: <5 mg/dL

## 2020-02-19 LAB — PHOSPHORUS: Phosphorus: 4.8 mg/dL — ABNORMAL HIGH (ref 2.5–4.6)

## 2020-02-19 LAB — SODIUM, URINE, RANDOM: Sodium, Ur: 17 mmol/L

## 2020-02-19 LAB — PARATHYROID HORMONE, INTACT (NO CA): PTH: 57 pg/mL (ref 15–65)

## 2020-02-19 LAB — CREATININE, URINE, RANDOM: Creatinine, Urine: 300.84 mg/dL

## 2020-02-19 MED ORDER — ADULT MULTIVITAMIN W/MINERALS CH
1.0000 | ORAL_TABLET | Freq: Every day | ORAL | Status: DC
  Administered 2020-02-19 – 2020-02-22 (×4): 1
  Filled 2020-02-19 (×4): qty 1

## 2020-02-19 MED ORDER — SODIUM BICARBONATE 8.4 % IV SOLN
INTRAVENOUS | Status: DC
  Filled 2020-02-19: qty 150
  Filled 2020-02-19: qty 850

## 2020-02-19 MED ORDER — POTASSIUM CHLORIDE CRYS ER 20 MEQ PO TBCR: 40.0000 meq | EXTENDED_RELEASE_TABLET | Freq: Once | ORAL | Status: DC

## 2020-02-19 MED ORDER — THIAMINE HCL 100 MG PO TABS
100.0000 mg | ORAL_TABLET | Freq: Every day | ORAL | Status: DC
  Administered 2020-02-19 – 2020-02-22 (×4): 100 mg
  Filled 2020-02-19 (×4): qty 1

## 2020-02-19 MED ORDER — DIPHENHYDRAMINE HCL 50 MG/ML IJ SOLN
12.5000 mg | Freq: Once | INTRAMUSCULAR | Status: AC
  Administered 2020-02-19: 12.5 mg via INTRAVENOUS
  Filled 2020-02-19: qty 1

## 2020-02-19 MED ORDER — MAGNESIUM SULFATE 2 GM/50ML IV SOLN
2.0000 g | Freq: Once | INTRAVENOUS | Status: AC
  Administered 2020-02-19: 2 g via INTRAVENOUS
  Filled 2020-02-19: qty 50

## 2020-02-19 MED ORDER — POTASSIUM CHLORIDE 20 MEQ PO PACK
60.0000 meq | PACK | Freq: Once | ORAL | Status: AC
  Administered 2020-02-19: 60 meq
  Filled 2020-02-19: qty 3

## 2020-02-19 MED ORDER — LACTATED RINGERS IV SOLN: INTRAVENOUS | Status: DC

## 2020-02-19 MED ORDER — INSULIN ASPART 100 UNIT/ML ~~LOC~~ SOLN
1.0000 [IU] | SUBCUTANEOUS | Status: DC
  Administered 2020-02-19 – 2020-02-23 (×3): 1 [IU] via SUBCUTANEOUS

## 2020-02-19 MED ORDER — METHYLPREDNISOLONE SODIUM SUCC 125 MG IJ SOLR
125.0000 mg | Freq: Once | INTRAMUSCULAR | Status: AC
  Administered 2020-02-19: 125 mg via INTRAVENOUS
  Filled 2020-02-19: qty 2

## 2020-02-19 MED ORDER — FOLIC ACID 1 MG PO TABS
1.0000 mg | ORAL_TABLET | Freq: Every day | ORAL | Status: DC
  Administered 2020-02-19 – 2020-02-22 (×4): 1 mg
  Filled 2020-02-19 (×4): qty 1

## 2020-02-19 MED ORDER — VITAL HIGH PROTEIN PO LIQD
1000.0000 mL | ORAL | Status: DC
  Administered 2020-02-19: 1000 mL

## 2020-02-19 MED ORDER — AMLODIPINE BESYLATE 5 MG PO TABS
5.0000 mg | ORAL_TABLET | Freq: Every day | ORAL | Status: DC
  Administered 2020-02-19 – 2020-02-20 (×2): 5 mg via ORAL
  Filled 2020-02-19 (×2): qty 1

## 2020-02-19 NOTE — Progress Notes (Signed)
[facility] Progress Note Patient Name: [name] DOB: [birth year] MRN: [id number]   Date of Service  02/19/2020  HPI/Events of Note  Patient's sodium bicarbonate infusion is about to drop off the MAR.  eICU Interventions  ABG ordered to assess acid-base status, sodium bicarbonate infusion renewed pending results.        Kerry Kass Abenezer Odonell 02/19/2020, 2:24 AM

## 2020-02-19 NOTE — TOC Initial Note (Signed)
Transition of Care [facility]) - Initial/Assessment Note    Patient Details  Name: Tiffany [name] MRN: [id number] Date of Birth: 1960-08-31  Transition of Care [facility]) CM/SW Contact:    Leeroy Cha, RN Phone Number: 02/19/2020, 8:13 AM  Clinical Narrative:                 60 y/o woman with a history of schizoaffective disorder who was brought to the hospital for abnormal behavior due to need for inpatient psychiatric treatment. Per ED records she has been not eating for several days at home and has been barricading herself inside. She has not been taking her psychiatric medications. IVC initiated by APS. Her initial labs were significantly abnormal with AKI, hypokalemia, hypomagnesemia, incompletely compensated metabolic acidosis, hypoglycemia. She was evaluated in the ED and started on aggressive IVF resuscitation, and electrolyte repletion. [name] crying but unable to provide much history. PLAN: follow psych orders for ivc done by APS Progression: bun40/creat. 5.56 uds+ for benzo's and amphetamines Iv mgso4 2gms x1 , iv nahco3 drip at 100cc/hjr, Expected Discharge Plan: Psychiatric Hospital Barriers to Discharge: Continued Medical Work up   Patient Goals and CMS Choice Patient states their goals for this hospitalization and ongoing recovery are:: i want to go home CMS Medicare.gov Compare Post Acute Care list provided to:: Patient    Expected Discharge Plan and Services Expected Discharge Plan: [place] arrangements for the past 2 months: Apartment                                      Prior Living Arrangements/Services Living arrangements for the past 2 months: Apartment Lives with:: Self Patient language and need for interpreter reviewed:: Yes Do you feel safe going back to the place where you live?: No   aps involved and ivc orders present.  Need for Family Participation in Patient Care: Yes (Comment) Care giver support system in place?: Yes  (comment)   Criminal Activity/Legal Involvement Pertinent to Current Situation/Hospitalization: No - Comment as needed  Activities of Daily Living Home Assistive Devices/Equipment: None ADL Screening (condition at time of admission) Patient's cognitive ability adequate to safely complete daily activities?: No Is the patient deaf or have difficulty hearing?: No Does the patient have difficulty seeing, even when wearing glasses/contacts?: No Does the patient have difficulty concentrating, remembering, or making decisions?: Yes Patient able to express need for assistance with ADLs?: No Does the patient have difficulty dressing or bathing?: Yes Independently performs ADLs?: No Communication: Appropriate for developmental age Dressing (OT): Needs assistance Is this a change from baseline?: Pre-admission baseline Grooming: Needs assistance Is this a change from baseline?: Pre-admission baseline Feeding: Appropriate for developmental age Bathing: Needs assistance Is this a change from baseline?: Pre-admission baseline Toileting: Needs assistance Is this a change from baseline?: Pre-admission baseline In/Out Bed: Needs assistance Is this a change from baseline?: Pre-admission baseline Walks in Home: Appropriate for developmental age Does the patient have difficulty walking or climbing stairs?: Yes Weakness of Legs: None Weakness of Arms/Hands: None  Permission Sought/Granted                  Emotional Assessment Appearance:: Appears stated age Attitude/Demeanor/Rapport: Crying Affect (typically observed): Depressed Orientation: : Oriented to Self,Oriented to Place,Oriented to Situation,Oriented to  Time Alcohol / Substance Use: Illicit Drugs Psych Involvement: Yes (comment)  Admission diagnosis:  Hypocalcemia [E83.51]  Hypokalemia [Y56.3] Metabolic acidosis [S93.7] Paranoia ([place]) [F22] AKI (acute kidney injury) ([place]) [N17.9] Acute renal failure, unspecified acute renal  failure type ([place]) [N17.9] Altered mental status, unspecified altered mental status type [R41.82] Patient Active Problem List   Diagnosis Date Noted  . AKI (acute kidney injury) ([place]) 02/18/2020  . Pelvic mass in female 01/02/2020  . Genital herpes 11/25/2019  . Pressure injury of skin 10/28/2019  . ARF (acute renal failure) ([place]) 10/27/2019  . Family discord 02/04/2019  . Aggressive behavior   . PTSD (post-traumatic stress disorder) 05/27/2018  . Schizoaffective disorder, bipolar type ([place]) 04/05/2018  . Closed displaced fracture of fifth metacarpal bone 03/21/2018  . Difficulty with speech 01/24/2018  . Encephalopathy 11/21/2017  . Drug abuse ([place]) 11/21/2017  . Frequent falls 10/11/2017  . Binge eating disorder   . Dependence on continuous supplemental oxygen 05/14/2017  . Gout 04/11/2017  . CKD (chronic kidney disease) stage 3, GFR 30-59 ml/min (HCC) 12/15/2016  . Carotid artery stenosis   . Osteoarthritis 10/26/2016  . Anoxic brain injury ([place]) 09/08/2016  . Overactive bladder 06/07/2016  . QT prolongation   . OSA and COPD overlap syndrome ([place])   . Arthritis   . Essential hypertension 03/22/2016  . Cocaine use disorder, severe, in sustained remission ([place]) 12/17/2015  . History of drug abuse in remission ([place]) 11/28/2015  . Chronic diastolic congestive heart failure ([place])   . Chronic respiratory failure with hypoxia and hypercapnia ([place]) 06/22/2015  . Tobacco use disorder 07/22/2014  . COPD (chronic obstructive pulmonary disease) ([place]) 07/08/2014  . Seizure ([place]) 01/04/2013  . Chronic pain syndrome 06/18/2012  . Dyslipidemia 04/24/2011  . Diabetic neuropathy ([place]) 04/24/2011  . Morbid obesity ([place]) 10/18/2010  . Type 2 diabetes mellitus ([place]) 10/18/2010   PCP:  [name], MD Pharmacy:   [facility] [place], [place] AT [address] [place] [address] Phone: [phone] Fax:  [phone]     Social Determinants of Health (SDOH) Interventions    Readmission Risk Interventions No flowsheet data found.

## 2020-02-19 NOTE — Progress Notes (Signed)
NAME:  Tiffany [name], MRN:  [id number], DOB:  12-25-1960, LOS: 1 ADMISSION DATE:  02/18/2020, CONSULTATION DATE:  02/18/20 REFERRING MD:  Dina Rich, EDP, CHIEF COMPLAINT:  acidosis   Brief History:  Brought in for IVC for hallucinations and paranoid behavior, found to have severe metabolic acidosis and profound electrolyte abnormalities.  History of Present Illness:  [name] is a 60 y/o woman with a history of schizoaffective disorder who was brought to the hospital for abnormal behavior due to need for inpatient psychiatric treatment. Per ED records she has been not eating for several days at home and has been barricading herself inside. She has not been taking her psychiatric medications. IVC initiated by APS. Her initial labs were significantly abnormal with AKI, hypokalemia, hypomagnesemia, incompletely compensated metabolic acidosis, hypoglycemia. She was evaluated in the ED and started on aggressive IVF resuscitation, and electrolyte repletion. [name] crying but unable to provide much history.  Past Medical History:  DM2 Stroke Seizure Schizoaffective disorder GERD OSA; previously with  HLD COPD Obesity, now with severe weight loss History of cocaine abuse CKD PEA cardiac arrest in 2018> was due to AE COPD  Significant Hospital Events:    Consults:  Nephrology   Procedures:    Significant Diagnostic Tests:    Micro Data:  covid negative  Antimicrobials:    Interim History / Subjective:  Slept most of overnight. No issues with magnesium infusions.  Objective   Blood pressure (!) 156/93, pulse 79, temperature 98.3 F (36.8 C), temperature source Axillary, resp. rate 18, weight 62.1 kg, SpO2 97 %.        Intake/Output Summary (Last 24 hours) at 02/19/2020 0703 Last data filed at 02/19/2020 0600 Gross per 24 hour  Intake 3288.75 ml  Output 395 ml  Net 2893.75 ml   Filed Weights   02/19/20 0422  Weight: 62.1 kg    Examination: General: thin, frail  appearing woman laying in bed in NAD  HENT: [place]/AT, eyes anicteric, NGT in place, edentulous Lungs: breathing comfortably on RA, CTAB. Cardiovascular: RRR, no murmurs Abdomen: soft, NT, ND Extremities: no c/c/e Neuro: sleeping but arousable to verbal stimulation, not answering questions appropriately. Moving spontaneously Psych: calm, falls back asleep easily Derm: skin warm & dry  Resolved Hospital Problem list     Assessment & Plan:  Acute metabolic acidosis, high anion gap.  Etiology AKI & starvation ketosis.  Normal lactate. UDS +benzos, amphetamines. -Con't bicarb gtt.  --optimize electrolytes- supplement K+ & Mg+ today. Premedicate for magnesium infusion. -con't trickle TF; can eat when awake -appreciate Nephro's assistance   AKI, suspect pre-renal vs ATN  Hyperphosphatemia- improving -strict I/Os -con't volume resuscitation -avoid nephrotoxic meds; hold NSAIDs -renally dose meds  Hypomagnesemia -repletion with pre-medication  Hypokalemia -enteral repletion  Hypoglycemia> now mild hyperglycemia; history of DM2 -SSI PRN  Malnutrition; history of significant weight loss. High risk for refeeding syndrome -supplemental vitamins -Con't trickle TF while NGT present; otherwise can have reg diet if awake  Acute metabolic encephalopathy from electrolyte abnormalities superimposed on acute psychosis due to schizoaffective disorder -need to address metabolic disorders before resuming meds; high risk of complications of QTc prolongation with her electrolyte abnormalities  Leukopenia- potentially due to malnutrition -con't to monitor  Anemia- likely due to malnutrition -transfuse for Hb< 7 or hemodynamically significant bleeding -con't to monitor  Hyponatremia -con't to monitor  Best practice (evaluated daily)  Diet: reg diet, TF Pain/Anxiety/Delirium protocol (if indicated): n/a VAP protocol (if indicated): n/a DVT prophylaxis: heparin [facility]  prophylaxis:  n/a Glucose control: SSI PRN Mobility: bedrest Disposition:ICU  Goals of Care:  Last date of multidisciplinary goals of care discussion:no phone numbers for family in chart Family and staff present:  Summary of discussion:  Follow up goals of care discussion due:  Code Status: full  Labs   CBC: Recent Labs  Lab 02/18/20 1334 02/18/20 2114 02/19/20 0251  WBC 2.7* 2.2* 2.8*  NEUTROABS 1.8  --   --   HGB 10.2* 10.1* 9.1*  HCT 34.0* 32.0* 28.9*  MCV 92.9 90.7 90.0  PLT 325 293 390    Basic Metabolic Panel: Recent Labs  Lab 02/18/20 1334 02/18/20 1335 02/18/20 1650 02/18/20 2114 02/19/20 0251  NA 138  --   --  136 133*  135  K 2.6*  --   --  2.7* 3.1*  3.2*  CL 109  --   --  106 103  103  CO2 13*  --   --  17* 16*  17*  GLUCOSE 63*  --   --  127* 135*  137*  BUN 39*  --   --  41* 40*  41*  CREATININE 6.26*  --   --  5.58* 5.56*  5.57*  CALCIUM 7.2*  --   --  6.9* 6.6*  6.7*  MG  --  0.9*  --  1.9 1.8  PHOS  --   --  4.7* 5.9* 4.7*  4.8*   GFR: Estimated Creatinine Clearance: 9.3 mL/min (A) (by C-G formula based on SCr of 5.56 mg/dL (H)). Recent Labs  Lab 02/18/20 1334 02/18/20 1440 02/18/20 1700 02/18/20 2114 02/19/20 0251  WBC 2.7*  --   --  2.2* 2.8*  LATICACIDVEN  --  <0.3* 0.8 1.1  --     Liver Function Tests: Recent Labs  Lab 02/18/20 1334 02/19/20 0251  AST 40  --   ALT 31  --   ALKPHOS 152*  --   BILITOT 0.8  --   PROT 6.8  --   ALBUMIN 3.4* 2.8*   No results for input(s): LIPASE, AMYLASE in the last 168 hours. No results for input(s): AMMONIA in the last 168 hours.  ABG    Component Value Date/Time   PHART 7.319 (L) 02/19/2020 0342   PCO2ART 37.4 02/19/2020 0342   PO2ART 90.8 02/19/2020 0342   HCO3 18.7 (L) 02/19/2020 0342   TCO2 17 (L) 10/27/2019 0523   ACIDBASEDEF 6.3 (H) 02/19/2020 0342   O2SAT 97.1 02/19/2020 0342     Coagulation Profile: No results for input(s): INR, PROTIME in the last 168 hours.  Cardiac  Enzymes: Recent Labs  Lab 02/18/20 1650  CKTOTAL 505*    HbA1C: HbA1c, POC (controlled diabetic range)  Date/Time Value Ref Range Status  10/11/2017 08:53 AM 5.3 0.0 - 7.0 % Final   Hgb A1c MFr Bld  Date/Time Value Ref Range Status  11/08/2018 05:43 PM 5.6 4.8 - 5.6 % Final    Comment:             Prediabetes: 5.7 - 6.4          Diabetes: >6.4          Glycemic control for adults with diabetes: <7.0   08/05/2018 05:48 PM 4.9 4.8 - 5.6 % Final    Comment:             Prediabetes: 5.7 - 6.4          Diabetes: >6.4  Glycemic control for adults with diabetes: <7.0     CBG: Recent Labs  Lab 02/18/20 1529  GLUCAP 27*     Julian Hy, DO 02/19/20 7:26 AM [facility]  From 7AM- 7PM if no response to pager, please call [phone]. After hours, 7PM- 7AM, please call Elink  [phone].

## 2020-02-19 NOTE — Progress Notes (Signed)
CRITICAL VALUE ALERT  Critical Value:  k 2.7   Date & Time Notied:  02/19/20 2200  Provider Notified: elink   Orders Received/Actions taken: k runs

## 2020-02-19 NOTE — Progress Notes (Signed)
[place] Progress Note Patient Name: Tiffany [name] DOB: 03/28/1960 MRN: [id number]   Date of Service  02/19/2020  HPI/Events of Note  Patient with urinary retention.  eICU Interventions  Foley cath order entered.        Valen Mascaro U Demichael Traum 02/19/2020, 1:02 AM

## 2020-02-19 NOTE — Progress Notes (Signed)
Patient ID: Tiffany [name], female   DOB: June 30, 1960, 60 y.o.   MRN: [id number] S: No events overnight.  Has been sleeping all morning O:BP (!) 162/98   Pulse 78   Temp 97.9 F (36.6 C) (Axillary)   Resp (!) 22   Ht 5\' 2"  (1.575 m)   Wt 62.1 kg   SpO2 96%   BMI 25.04 kg/m   Intake/Output Summary (Last 24 hours) at 02/19/2020 1219 Last data filed at 02/19/2020 0850 Gross per 24 hour  Intake 3622.08 ml  Output 395 ml  Net 3227.08 ml   Intake/Output: I/O last 3 completed shifts: In: 3288.8 [I.V.:1403; NG/GT:72; IV Piggyback:1813.8] Out: 395 [Urine:395]  Intake/Output this shift:  Total I/O In: 333.3 [I.V.:283.3; IV Piggyback:50] Out: -  Weight change:  Gen: somnolent  CVS:RRR Resp: cta Abd: +BS, soft, NT/ND Ext: no edema  Recent Labs  Lab 02/18/20 1334 02/18/20 1650 02/18/20 2114 02/19/20 0251  NA 138  --  136 133*  135  K 2.6*  --  2.7* 3.1*  3.2*  CL 109  --  106 103  103  CO2 13*  --  17* 16*  17*  GLUCOSE 63*  --  127* 135*  137*  BUN 39*  --  41* 40*  41*  CREATININE 6.26*  --  5.58* 5.56*  5.57*  ALBUMIN 3.4*  --   --  2.8*  CALCIUM 7.2*  --  6.9* 6.6*  6.7*  PHOS  --  4.7* 5.9* 4.7*  4.8*  AST 40  --   --   --   ALT 31  --   --   --    Liver Function Tests: Recent Labs  Lab 02/18/20 1334 02/19/20 0251  AST 40  --   ALT 31  --   ALKPHOS 152*  --   BILITOT 0.8  --   PROT 6.8  --   ALBUMIN 3.4* 2.8*   No results for input(s): LIPASE, AMYLASE in the last 168 hours. No results for input(s): AMMONIA in the last 168 hours. CBC: Recent Labs  Lab 02/18/20 1334 02/18/20 2114 02/19/20 0251  WBC 2.7* 2.2* 2.8*  NEUTROABS 1.8  --   --   HGB 10.2* 10.1* 9.1*  HCT 34.0* 32.0* 28.9*  MCV 92.9 90.7 90.0  PLT 325 293 299   Cardiac Enzymes: Recent Labs  Lab 02/18/20 1650  CKTOTAL 505*   CBG: Recent Labs  Lab 02/18/20 1529  GLUCAP 52*    Iron Studies: No results for input(s): IRON, TIBC, TRANSFERRIN, FERRITIN in the last 72  hours. Studies/Results: DG Abd 1 View  Result Date: 02/18/2020 CLINICAL DATA:  Feeding tube placement. EXAM: ABDOMEN - 1 VIEW COMPARISON:  None. FINDINGS: Tip of the weighted enteric tube below the diaphragm in the left upper quadrant in the region of the proximal stomach. Tip of the tube is not post pyloric. Slight gaseous distension of transverse colon, no evidence of bowel obstruction. IMPRESSION: Tip of the weighted enteric tube below the diaphragm in the left upper quadrant in the region of the proximal stomach. Electronically Signed   By: Keith Rake M.D.   On: 02/18/2020 20:39   US Renal  Result Date: 02/18/2020 CLINICAL DATA:  AK I EXAM: RENAL / URINARY TRACT ULTRASOUND COMPLETE COMPARISON:  CT abdomen pelvis October 30, 2019 and renal ultrasound October 27, 2019 FINDINGS: Right Kidney: Renal measurements: 9.1 x 4.7 x 4.2 cm = volume: 93 mL. Increased echogenicity. No mass or hydronephrosis visualized. Left  Kidney: Renal measurements: 8.9 x 4.6 x 4.5 cm = volume: 97 mL. Increased echogenicity. No mass or hydronephrosis visualized. Bladder: Appears normal for degree of bladder distention. Other: Large cystic structure superior to the bladder which measures 8.0 x 7.3 x 6.2 cm not significant changed in comparison to prior imaging. IMPRESSION: 1. No hydronephrosis. 2. Increased echogenicity of the bilateral kidneys likely reflecting medical renal disease. 3. Large cystic structure superior to the bladder, not significant changed in comparison to prior imaging. Recommend gynecologic referral if not previously performed. Electronically Signed   By: Dahlia Bailiff MD   On: 02/18/2020 17:12   DG Chest Portable 1 View  Result Date: 02/18/2020 CLINICAL DATA:  60 year old female with concern for infection. EXAM: PORTABLE CHEST 1 VIEW COMPARISON:  Chest radiograph dated 10/27/2019. FINDINGS: No focal consolidation, pleural effusion or pneumothorax. Mild cardiomegaly. No acute osseous pathology.  Degenerative changes of the spine. IMPRESSION: No active disease. Electronically Signed   By: Anner Crete M.D.   On: 02/18/2020 15:23   . Chlorhexidine Gluconate Cloth  6 each Topical Q0600  . feeding supplement (PROSource TF)  45 mL Per Tube BID  . feeding supplement (VITAL HIGH PROTEIN)  1,000 mL Per Tube Q24H  . folic acid  1 mg Per Tube Daily  . heparin  5,000 Units Subcutaneous Q8H  . insulin aspart  1-3 Units Subcutaneous Q4H  . multivitamin with minerals  1 tablet Per Tube Daily  . thiamine  100 mg Per Tube Daily    BMET    Component Value Date/Time   NA 133 (L) 02/19/2020 0251   NA 135 02/19/2020 0251   NA 142 11/25/2019 1642   K 3.1 (L) 02/19/2020 0251   K 3.2 (L) 02/19/2020 0251   CL 103 02/19/2020 0251   CL 103 02/19/2020 0251   CO2 16 (L) 02/19/2020 0251   CO2 17 (L) 02/19/2020 0251   GLUCOSE 135 (H) 02/19/2020 0251   GLUCOSE 137 (H) 02/19/2020 0251   BUN 40 (H) 02/19/2020 0251   BUN 41 (H) 02/19/2020 0251   BUN 13 11/25/2019 1642   CREATININE 5.56 (H) 02/19/2020 0251   CREATININE 5.57 (H) 02/19/2020 0251   CREATININE 1.22 (H) 02/18/2016 1146   CALCIUM 6.6 (L) 02/19/2020 0251   CALCIUM 6.7 (L) 02/19/2020 0251   GFRNONAA 8 (L) 02/19/2020 0251   GFRNONAA 8 (L) 02/19/2020 0251   GFRNONAA 50 (L) 02/18/2016 1146   GFRAA 56 (L) 11/25/2019 1642   GFRAA 57 (L) 02/18/2016 1146   CBC    Component Value Date/Time   WBC 2.8 (L) 02/19/2020 0251   RBC 3.21 (L) 02/19/2020 0251   HGB 9.1 (L) 02/19/2020 0251   HGB 9.0 (L) 11/25/2019 1642   HCT 28.9 (L) 02/19/2020 0251   HCT 28.2 (L) 11/25/2019 1642   PLT 299 02/19/2020 0251   PLT 375 11/25/2019 1642   MCV 90.0 02/19/2020 0251   MCV 90 11/25/2019 1642   MCH 28.3 02/19/2020 0251   MCHC 31.5 02/19/2020 0251   RDW 13.8 02/19/2020 0251   RDW 15.4 11/25/2019 1642   LYMPHSABS 0.5 (L) 02/18/2020 1334   LYMPHSABS 2.0 11/25/2019 1642   MONOABS 0.3 02/18/2020 1334   EOSABS 0.0 02/18/2020 1334   EOSABS 0.2 11/25/2019  1642   BASOSABS 0.0 02/18/2020 1334   BASOSABS 0.0 11/25/2019 1642     Assessment/Plan: 1.  AKI/CKD stage 3b- unable to obtain a complete history about her presentation.  Telehealth visit with Psych reported only arthralgias,  gait problem, and anxiety, no mention of N/V/D or poor po intake.  Given hypotension this is likely volume depletion +/- NSAID use as source of AKI.   1. Agree with IVF's, lactated ringers and follow UOP and Scr. 2. Renal US without obstruction 3. Urine studies with low FeNa consistent with prerenal and improving with IVF's 4. No urgent indication for dialysis at this time and will follow response to IVF's closely. 2. Hypokalemia- will need to be repleted before replacing bicarb as this will exacerbate the issue.  Telemetry and follow labs. 3. Anion gap acidosis- presumably due to #1.  Salicylate levels normal.  Start LR IVF after potassium repleted.  Lactate <0.3 so agree with Dr. Carlis Abbott may be a component of starvation ketosis (hypokalemia, hypomagnesemia, hypocalcemia, and significant weight loss ~10 kg since July 2021).   4. Hypotension- presumably due to volume depletion.  Follow after IVF's. 5. AMS- possibly acute metabolic encephalopathy but will also order urine drug screen.  Also had low glucose and would give 1 amp of D50 and follow. 6. Schizoaffective disorder (bipolar type or paranoid schizophrenia)- recommend psych eval. 7. Hypocalcemia- recommend IV calcium chloride as this will worsen with bicarb.  Will check vit D levels. 8. Hypomagnesemia- tolerated IV magnesium.   Donetta Potts, MD [employer] [phone]

## 2020-02-20 ENCOUNTER — Telehealth: Payer: Self-pay

## 2020-02-20 ENCOUNTER — Telehealth (HOSPITAL_COMMUNITY): Payer: Self-pay | Admitting: *Deleted

## 2020-02-20 DIAGNOSIS — F200 Paranoid schizophrenia: Secondary | ICD-10-CM | POA: Diagnosis present

## 2020-02-20 DIAGNOSIS — B9620 Unspecified Escherichia coli [E. coli] as the cause of diseases classified elsewhere: Secondary | ICD-10-CM | POA: Diagnosis present

## 2020-02-20 DIAGNOSIS — G9341 Metabolic encephalopathy: Secondary | ICD-10-CM

## 2020-02-20 DIAGNOSIS — I10 Essential (primary) hypertension: Secondary | ICD-10-CM

## 2020-02-20 DIAGNOSIS — N390 Urinary tract infection, site not specified: Secondary | ICD-10-CM

## 2020-02-20 DIAGNOSIS — F250 Schizoaffective disorder, bipolar type: Secondary | ICD-10-CM

## 2020-02-20 DIAGNOSIS — F209 Schizophrenia, unspecified: Secondary | ICD-10-CM | POA: Diagnosis present

## 2020-02-20 DIAGNOSIS — E872 Acidosis, unspecified: Secondary | ICD-10-CM | POA: Diagnosis present

## 2020-02-20 DIAGNOSIS — G4710 Hypersomnia, unspecified: Secondary | ICD-10-CM | POA: Insufficient documentation

## 2020-02-20 DIAGNOSIS — F259 Schizoaffective disorder, unspecified: Secondary | ICD-10-CM | POA: Diagnosis present

## 2020-02-20 DIAGNOSIS — R4182 Altered mental status, unspecified: Secondary | ICD-10-CM | POA: Insufficient documentation

## 2020-02-20 DIAGNOSIS — F29 Unspecified psychosis not due to a substance or known physiological condition: Secondary | ICD-10-CM | POA: Diagnosis present

## 2020-02-20 DIAGNOSIS — E871 Hypo-osmolality and hyponatremia: Secondary | ICD-10-CM

## 2020-02-20 DIAGNOSIS — F17200 Nicotine dependence, unspecified, uncomplicated: Secondary | ICD-10-CM

## 2020-02-20 DIAGNOSIS — E1122 Type 2 diabetes mellitus with diabetic chronic kidney disease: Secondary | ICD-10-CM

## 2020-02-20 DIAGNOSIS — N1832 Chronic kidney disease, stage 3b: Secondary | ICD-10-CM

## 2020-02-20 HISTORY — DX: Metabolic encephalopathy: G93.41

## 2020-02-20 HISTORY — DX: Unspecified Escherichia coli (E. coli) as the cause of diseases classified elsewhere: N39.0

## 2020-02-20 LAB — RETICULOCYTES
Immature Retic Fract: 29.8 % — ABNORMAL HIGH (ref 2.3–15.9)
RBC.: 3.04 MIL/uL — ABNORMAL LOW (ref 3.87–5.11)
Retic Count, Absolute: 40.7 10*3/uL (ref 19.0–186.0)
Retic Ct Pct: 1.3 % (ref 0.4–3.1)

## 2020-02-20 LAB — CBC
HCT: 26.7 % — ABNORMAL LOW (ref 36.0–46.0)
Hemoglobin: 8.7 g/dL — ABNORMAL LOW (ref 12.0–15.0)
MCH: 28.5 pg (ref 26.0–34.0)
MCHC: 32.6 g/dL (ref 30.0–36.0)
MCV: 87.5 fL (ref 80.0–100.0)
Platelets: 335 10*3/uL (ref 150–400)
RBC: 3.05 MIL/uL — ABNORMAL LOW (ref 3.87–5.11)
RDW: 14 % (ref 11.5–15.5)
WBC: 5.3 10*3/uL (ref 4.0–10.5)
nRBC: 1.1 % — ABNORMAL HIGH (ref 0.0–0.2)

## 2020-02-20 LAB — BASIC METABOLIC PANEL
Anion gap: 12 (ref 5–15)
BUN: 59 mg/dL — ABNORMAL HIGH (ref 6–20)
CO2: 19 mmol/L — ABNORMAL LOW (ref 22–32)
Calcium: 7.9 mg/dL — ABNORMAL LOW (ref 8.9–10.3)
Chloride: 103 mmol/L (ref 98–111)
Creatinine, Ser: 4.6 mg/dL — ABNORMAL HIGH (ref 0.44–1.00)
GFR, Estimated: 10 mL/min — ABNORMAL LOW (ref >60)
Glucose, Bld: 111 mg/dL — ABNORMAL HIGH (ref 70–99)
Potassium: 3.8 mmol/L (ref 3.5–5.1)
Sodium: 134 mmol/L — ABNORMAL LOW (ref 135–145)

## 2020-02-20 LAB — HEMOGLOBIN A1C
Hgb A1c MFr Bld: 4.8 % (ref 4.8–5.6)
Mean Plasma Glucose: 91.06 mg/dL

## 2020-02-20 LAB — FOLATE: Folate: 3.7 ng/mL — ABNORMAL LOW (ref >5.9)

## 2020-02-20 LAB — MAGNESIUM: Magnesium: 2 mg/dL (ref 1.7–2.4)

## 2020-02-20 LAB — IRON AND TIBC
Iron: 45 ug/dL (ref 28–170)
Saturation Ratios: 28 % (ref 10.4–31.8)
TIBC: 158 ug/dL — ABNORMAL LOW (ref 250–450)
UIBC: 113 ug/dL

## 2020-02-20 LAB — GLUCOSE, CAPILLARY
Glucose-Capillary: 109 mg/dL — ABNORMAL HIGH (ref 70–99)
Glucose-Capillary: 98 mg/dL (ref 70–99)
Glucose-Capillary: 83 mg/dL (ref 70–99)
Glucose-Capillary: 92 mg/dL (ref 70–99)
Glucose-Capillary: 107 mg/dL — ABNORMAL HIGH (ref 70–99)
Glucose-Capillary: 109 mg/dL — ABNORMAL HIGH (ref 70–99)

## 2020-02-20 LAB — PHOSPHORUS: Phosphorus: 2.8 mg/dL (ref 2.5–4.6)

## 2020-02-20 LAB — VITAMIN B12: Vitamin B-12: 578 pg/mL (ref 180–914)

## 2020-02-20 LAB — FERRITIN: Ferritin: 154 ng/mL (ref 11–307)

## 2020-02-20 MED ORDER — SODIUM CHLORIDE 0.9 % IV SOLN
1.0000 g | INTRAVENOUS | Status: DC
  Administered 2020-02-20 – 2020-02-22 (×3): 1 g via INTRAVENOUS
  Filled 2020-02-20: qty 10
  Filled 2020-02-20: qty 1
  Filled 2020-02-20: qty 10

## 2020-02-20 MED ORDER — TRAZODONE HCL 100 MG PO TABS
100.0000 mg | ORAL_TABLET | Freq: Every day | ORAL | Status: DC
  Administered 2020-02-20 – 2020-02-25 (×6): 100 mg via ORAL
  Filled 2020-02-20: qty 1
  Filled 2020-02-20: qty 2
  Filled 2020-02-20 (×4): qty 1

## 2020-02-20 MED ORDER — JEVITY 1.2 CAL PO LIQD
1000.0000 mL | ORAL | Status: DC
  Administered 2020-02-21 – 2020-02-23 (×2): 1000 mL
  Filled 2020-02-20 (×10): qty 1000

## 2020-02-20 MED ORDER — PRAZOSIN HCL 5 MG PO CAPS: 5.0000 mg | ORAL_CAPSULE | Freq: Two times a day (BID) | ORAL | Status: DC

## 2020-02-20 MED ORDER — HYDRALAZINE HCL 20 MG/ML IJ SOLN
10.0000 mg | INTRAMUSCULAR | Status: DC | PRN
  Administered 2020-02-20: 10 mg via INTRAVENOUS
  Administered 2020-02-21: 20 mg via INTRAVENOUS
  Administered 2020-02-24: 10 mg via INTRAVENOUS
  Filled 2020-02-20 (×3): qty 1

## 2020-02-20 MED ORDER — GABAPENTIN 600 MG PO TABS: 600.0000 mg | ORAL_TABLET | Freq: Two times a day (BID) | ORAL | Status: DC

## 2020-02-20 MED ORDER — PRAZOSIN HCL 1 MG PO CAPS
2.0000 mg | ORAL_CAPSULE | Freq: Every day | ORAL | Status: DC
  Administered 2020-02-20 – 2020-02-25 (×6): 2 mg via ORAL
  Filled 2020-02-20 (×8): qty 2

## 2020-02-20 MED ORDER — DIAZEPAM 5 MG PO TABS
10.0000 mg | ORAL_TABLET | Freq: Three times a day (TID) | ORAL | Status: DC | PRN
  Administered 2020-02-22 – 2020-02-24 (×5): 10 mg via ORAL
  Filled 2020-02-20 (×6): qty 2

## 2020-02-20 MED ORDER — LISDEXAMFETAMINE DIMESYLATE 20 MG PO CAPS
20.0000 mg | ORAL_CAPSULE | Freq: Every day | ORAL | Status: DC
  Administered 2020-02-21 – 2020-02-26 (×6): 20 mg via ORAL
  Filled 2020-02-20 (×8): qty 1

## 2020-02-20 MED ORDER — GABAPENTIN 300 MG PO CAPS
300.0000 mg | ORAL_CAPSULE | Freq: Every day | ORAL | Status: DC
  Administered 2020-02-20 – 2020-02-26 (×7): 300 mg via ORAL
  Filled 2020-02-20 (×7): qty 1

## 2020-02-20 MED ORDER — ENSURE ENLIVE PO LIQD
237.0000 mL | Freq: Two times a day (BID) | ORAL | Status: DC
  Administered 2020-02-20 – 2020-02-25 (×11): 237 mL via ORAL

## 2020-02-20 NOTE — Telephone Encounter (Signed)
Call placed to [facility] regarding PCS referral. Spoke to Tammy who stated the patient is already receiving the maximum hours of service. She recently changed her PCS provider to a new agency.

## 2020-02-20 NOTE — Consult Note (Addendum)
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 MIL/uL   Hemoglobin 10.1 (L) 12.0 - 15.0 g/dL   HCT 32.0 (L) 36.0 - 46.0 %   MCV 90.7 80.0 - 100.0 fL   MCH 28.6 26.0 - 34.0 pg   MCHC 31.6 30.0 - 36.0 g/dL   RDW 13.9 11.5 - 15.5 %   Platelets 293 150 - 400 K/uL   nRBC 0.0 0.0 - 0.2 %    Comment: Performed at [facility], [place] [address],  [place], [place] [address]  Urine rapid drug screen (hosp performed)     Status: Abnormal   Collection Time: 02/19/20  2:00 AM  Result Value Ref Range   Opiates NONE DETECTED NONE DETECTED   Cocaine NONE DETECTED NONE DETECTED   Benzodiazepines POSITIVE (A) NONE DETECTED   Amphetamines POSITIVE (A) NONE DETECTED   Tetrahydrocannabinol NONE DETECTED NONE DETECTED   Barbiturates NONE DETECTED NONE DETECTED    Comment: (NOTE) DRUG SCREEN FOR MEDICAL PURPOSES ONLY.  IF CONFIRMATION IS NEEDED FOR ANY PURPOSE, NOTIFY LAB WITHIN 5 DAYS.  LOWEST DETECTABLE LIMITS FOR URINE DRUG SCREEN Drug Class                     Cutoff (ng/mL) Amphetamine and metabolites    1000 Barbiturate and metabolites    200 Benzodiazepine                 756 Tricyclics and metabolites     300 Opiates and metabolites        300 Cocaine and metabolites        300 THC                            50 Performed at [facility], [place] [address] [place], [place] [address]   Urinalysis, Routine w reflex microscopic Urine, Catheterized     Status: Abnormal   Collection Time: 02/19/20  2:00 AM  Result Value Ref Range   Color, Urine AMBER (A) YELLOW    Comment: BIOCHEMICALS MAY BE AFFECTED BY COLOR   APPearance CLOUDY (A) CLEAR   Specific Gravity, Urine 1.015 1.005 - 1.030   pH 5.0 5.0 - 8.0   Glucose, UA NEGATIVE NEGATIVE mg/dL   Hgb urine dipstick SMALL (A) NEGATIVE   Bilirubin Urine NEGATIVE NEGATIVE   Ketones, ur 5 (A) NEGATIVE mg/dL   Protein, ur 100 (A) NEGATIVE mg/dL   Nitrite NEGATIVE NEGATIVE   Leukocytes,Ua LARGE (A) NEGATIVE   RBC / HPF 6-10 0 - 5 RBC/hpf   WBC, UA 21-50 0 - 5 WBC/hpf   Bacteria, UA MANY (A) NONE SEEN   Squamous Epithelial / LPF 0-5 0 - 5   Mucus PRESENT    Hyaline Casts, UA PRESENT    Amorphous Crystal PRESENT    Non Squamous Epithelial 0-5 (A) NONE SEEN    Comment: Performed at [facility], [place] [address], [place], [place] [address]  Sodium,  urine, random     Status: None   Collection Time: 02/19/20  2:00 AM  Result Value Ref Range   Sodium, Ur 17 mmol/L    Comment: Performed at [facility], [place] [address] [place], [place] [address]  Creatinine, urine, random     Status: None   Collection Time: 02/19/20  2:00 AM  Result Value  Ref Range   Creatinine, Urine 300.84 mg/dL    Comment: Performed at [facility], [place] [address] [place], [place] [address]  Osmolality, urine     Status: None   Collection Time: 02/19/20  2:00 AM  Result Value Ref Range   Osmolality, Ur 300 300 - 900 mOsm/kg    Comment: Performed at [place] [address] [place], [place] [address]  Urine culture     Status: Abnormal (Preliminary result)   Collection Time: 02/19/20  2:00 AM   Specimen: Urine, Clean Catch  Result Value Ref Range   Specimen Description      URINE, CLEAN CATCH Performed at [facility], [place] [address], [place], [place] [address]    Special Requests      NONE Performed at [facility], [place] [address], [place], [place] [address]    Culture (A)     >=100,000 COLONIES/mL GRAM NEGATIVE RODS SUSCEPTIBILITIES TO FOLLOW Performed at [facility], [place] [address], [place], [place] [address]    Report Status PENDING   Renal function panel     Status: Abnormal   Collection Time: 02/19/20  2:51 AM  Result Value Ref Range   Sodium 133 (L) 135 - 145 mmol/L   Potassium 3.1 (L) 3.5 - 5.1 mmol/L   Chloride 103 98 - 111 mmol/L   CO2 16 (L) 22 - 32 mmol/L   Glucose, Bld 135 (H) 70 - 99 mg/dL    Comment: Glucose reference range applies only to samples taken after fasting for at least 8 hours.   BUN 40 (H) 6 - 20 mg/dL   Creatinine, Ser 5.56 (H) 0.44 - 1.00 mg/dL   Calcium 6.6 (L) 8.9 - 10.3 mg/dL   Phosphorus 4.7 (H) 2.5 - 4.6 mg/dL   Albumin 2.8 (L) 3.5 - 5.0 g/dL   GFR, Estimated 8 (L) >60 mL/min    Comment: (NOTE) Calculated using the  CKD-EPI Creatinine Equation (2021)    Anion gap 14 5 - 15    Comment: Performed at [facility], [place] [address], [place], [place] [address]  CBC     Status: Abnormal   Collection Time: 02/19/20  2:51 AM  Result Value Ref Range   WBC 2.8 (L) 4.0 - 10.5 K/uL   RBC 3.21 (L) 3.87 - 5.11 MIL/uL   Hemoglobin 9.1 (L) 12.0 - 15.0 g/dL   HCT 28.9 (L) 36.0 - 46.0 %   MCV 90.0 80.0 - 100.0 fL   MCH 28.3 26.0 - 34.0 pg   MCHC 31.5 30.0 - 36.0 g/dL   RDW 13.8 11.5 - 15.5 %   Platelets 299 150 - 400 K/uL   nRBC 0.0 0.0 - 0.2 %    Comment: Performed at [facility], [place] [address] [place], [place] [address]  Magnesium     Status: None   Collection Time: 02/19/20  2:51 AM  Result Value Ref Range   Magnesium 1.8 1.7 - 2.4 mg/dL    Comment: Performed at [facility], [place] [address], [place], [place] [address]  Phosphorus     Status: Abnormal   Collection Time: 02/19/20  2:51 AM  Result Value Ref Range   Phosphorus 4.8 (H) 2.5 - 4.6 mg/dL    Comment: Performed at [facility], [place] [address], [place],  [address]  Basic metabolic panel     Status: Abnormal   Collection Time: 02/19/20  2:51 AM  Result Value Ref Range   Sodium 135 135 - 145 mmol/L   Potassium 3.2 (L) 3.5 - 5.1 mmol/L   Chloride 103 98 - 111 mmol/L   CO2 17 (L) 22 - 32 mmol/L   Glucose, Bld 137 (H) 70 - 99 mg/dL    Comment: Glucose reference range applies only to samples taken after fasting for at least 8 hours.   BUN 41 (H) 6 - 20 mg/dL   Creatinine, Ser 5.57 (H) 0.44 - 1.00 mg/dL   Calcium 6.7 (L) 8.9 - 10.3 mg/dL   GFR, Estimated 8 (L) >60 mL/min    Comment: (NOTE) Calculated using the CKD-EPI Creatinine Equation (2021)    Anion gap 15 5 - 15    Comment: Performed at [facility], [place] [address] [place], [place] [address]  Blood gas, arterial     Status: Abnormal   Collection Time: 02/19/20  3:42 AM  Result Value Ref  Range   FIO2 21.00    pH, Arterial 7.319 (L) 7.350 - 7.450   pCO2 arterial 37.4 32.0 - 48.0 mmHg   pO2, Arterial 90.8 83.0 - 108.0 mmHg   Bicarbonate 18.7 (L) 20.0 - 28.0 mmol/L   Acid-base deficit 6.3 (H) 0.0 - 2.0 mmol/L   O2 Saturation 97.1 %   Patient temperature 98.6    Allens test (pass/fail) PASS PASS    Comment: Performed at [place] [address] [place], [place] [address]  Basic metabolic panel     Status: Abnormal   Collection Time: 02/19/20  3:35 PM  Result Value Ref Range   Sodium 135 135 - 145 mmol/L   Potassium 3.8 3.5 - 5.1 mmol/L   Chloride 105 98 - 111 mmol/L   CO2 19 (L) 22 - 32 mmol/L   Glucose, Bld 119 (H) 70 - 99 mg/dL    Comment: Glucose reference range applies only to samples taken after fasting for at least 8 hours.   BUN 48 (H) 6 - 20 mg/dL   Creatinine, Ser 5.39 (H) 0.44 - 1.00 mg/dL   Calcium 7.2 (L) 8.9 - 10.3 mg/dL   GFR, Estimated 9 (L) >60 mL/min    Comment: (NOTE) Calculated using the CKD-EPI Creatinine Equation (2021)    Anion gap 11 5 - 15    Comment: Performed at [facility], [place] [address], [place], [place] [address]  Glucose, capillary     Status: Abnormal   Collection Time: 02/19/20  7:15 PM  Result Value Ref Range   Glucose-Capillary 116 (H) 70 - 99 mg/dL    Comment: Glucose reference range applies only to samples taken after fasting for at least 8 hours.  Glucose, capillary     Status: Abnormal   Collection Time: 02/19/20 11:18 PM  Result Value Ref Range   Glucose-Capillary 124 (H) 70 - 99 mg/dL    Comment: Glucose reference range applies only to samples taken after fasting for at least 8 hours.  Glucose, capillary     Status: Abnormal   Collection Time: 02/20/20  2:57 AM  Result Value Ref Range   Glucose-Capillary 109 (H) 70 - 99 mg/dL    Comment: Glucose reference range applies only to samples taken after fasting for at least 8 hours.  Magnesium     Status: None   Collection Time:  02/20/20  2:58 AM  Result Value Ref Range   Magnesium 2.0 1.7 - 2.4 mg/dL    Comment: Performed at [facility]  [place] [address], [place], [place] [address]  Phosphorus     Status: None   Collection Time: 02/20/20  2:58 AM  Result Value Ref Range   Phosphorus 2.8 2.5 - 4.6 mg/dL    Comment: Performed at [facility], [place] [address] [place], [place] [address]  Basic metabolic panel     Status: Abnormal   Collection Time: 02/20/20  2:58 AM  Result Value Ref Range   Sodium 134 (L) 135 - 145 mmol/L   Potassium 3.8 3.5 - 5.1 mmol/L   Chloride 103 98 - 111 mmol/L   CO2 19 (L) 22 - 32 mmol/L   Glucose, Bld 111 (H) 70 - 99 mg/dL    Comment: Glucose reference range applies only to samples taken after fasting for at least 8 hours.   BUN 59 (H) 6 - 20 mg/dL   Creatinine, Ser 4.60 (H) 0.44 - 1.00 mg/dL   Calcium 7.9 (L) 8.9 - 10.3 mg/dL   GFR, Estimated 10 (L) >60 mL/min    Comment: (NOTE) Calculated using the CKD-EPI Creatinine Equation (2021)    Anion gap 12 5 - 15    Comment: Performed at [facility], [place] [address], [place], [place] [address]  CBC     Status: Abnormal   Collection Time: 02/20/20  2:58 AM  Result Value Ref Range   WBC 5.3 4.0 - 10.5 K/uL   RBC 3.05 (L) 3.87 - 5.11 MIL/uL   Hemoglobin 8.7 (L) 12.0 - 15.0 g/dL   HCT 26.7 (L) 36.0 - 46.0 %   MCV 87.5 80.0 - 100.0 fL   MCH 28.5 26.0 - 34.0 pg   MCHC 32.6 30.0 - 36.0 g/dL   RDW 14.0 11.5 - 15.5 %   Platelets 335 150 - 400 K/uL   nRBC 1.1 (H) 0.0 - 0.2 %    Comment: Performed at [facility], [place] [address], [place], [place] [address]  Hemoglobin A1c     Status: None   Collection Time: 02/20/20  2:58 AM  Result Value Ref Range   Hgb A1c MFr Bld 4.8 4.8 - 5.6 %    Comment: (NOTE) Pre diabetes:          5.7%-6.4%  Diabetes:              >6.4%  Glycemic control for   <7.0% adults with diabetes    Mean Plasma Glucose 91.06 mg/dL     Comment: Performed at [place] [address], [place], [place] [address]  Reticulocytes     Status: Abnormal   Collection Time: 02/20/20  2:58 AM  Result Value Ref Range   Retic Ct Pct 1.3 0.4 - 3.1 %   RBC. 3.04 (L) 3.87 - 5.11 MIL/uL   Retic Count, Absolute 40.7 19.0 - 186.0 K/uL   Immature Retic Fract 29.8 (H) 2.3 - 15.9 %    Comment: Performed at [facility], [place] [address] [place], [place] [address]  Glucose, capillary     Status: None   Collection Time: 02/20/20  8:26 AM  Result Value Ref Range   Glucose-Capillary 98 70 - 99 mg/dL    Comment: Glucose reference range applies only to samples taken after fasting for at least 8 hours.   *Note: Due to a large number of results and/or encounters for the requested time period, some results have not been displayed. A complete set of results can be found in Results  Review.    Medications:  Current Facility-Administered Medications  Medication Dose Route Frequency Provider Last Rate Last Admin  . acetaminophen (TYLENOL) tablet 650 mg  650 mg Oral Q4H PRN Noemi Chapel P, DO      . amLODipine (NORVASC) tablet 5 mg  5 mg Oral Daily Noemi Chapel P, DO   5 mg at 02/20/20 0954  . cefTRIAXone (ROCEPHIN) 1 g in sodium chloride 0.9 % 100 mL IVPB  1 g Intravenous Q24H Eugenie Filler, MD 200 mL/hr at 02/20/20 1002 1 g at 02/20/20 1002  . Chlorhexidine Gluconate Cloth 2 % PADS 6 each  6 each Topical Q0600 Julian Hy, DO   6 each at 02/19/20 1622  . docusate sodium (COLACE) capsule 100 mg  100 mg Oral BID PRN Noemi Chapel P, DO   100 mg at 02/19/20 2359  . feeding supplement (PROSource TF) liquid 45 mL  45 mL Per Tube BID Noemi Chapel P, DO   45 mL at 02/20/20 0954  . feeding supplement (VITAL HIGH PROTEIN) liquid 1,000 mL  1,000 mL Per Tube Q24H Noemi Chapel P, DO   1,000 mL at 02/19/20 2200  . folic acid (FOLVITE) tablet 1 mg  1 mg Per Tube Daily Noemi Chapel P, DO   1 mg at 02/20/20 0954  . heparin injection  5,000 Units  5,000 Units Subcutaneous Q8H Julian Hy, DO   5,000 Units at 02/20/20 1497  . hydrALAZINE (APRESOLINE) injection 10-20 mg  10-20 mg Intravenous Q4H PRN Frederik Pear, MD   10 mg at 02/20/20 0509  . insulin aspart (novoLOG) injection 1-3 Units  1-3 Units Subcutaneous Q4H Julian Hy, DO   1 Units at 02/19/20 2328  . lactated ringers infusion   Intravenous Continuous Julian Hy, DO 100 mL/hr at 02/20/20 0500 Infusion Verify at 02/20/20 0500  . multivitamin with minerals tablet 1 tablet  1 tablet Per Tube Daily Julian Hy, DO   1 tablet at 02/20/20 0954  . polyethylene glycol (MIRALAX / GLYCOLAX) packet 17 g  17 g Oral Daily PRN Julian Hy, DO      . thiamine tablet 100 mg  100 mg Per Tube Daily Noemi Chapel P, DO   100 mg at 02/20/20 0263    Musculoskeletal:   Psychiatric Specialty Exam: Physical Exam Nursing note reviewed.  Neurological:     Mental Status: [name] alert and oriented to person, place, and time.     Review of Systems  Psychiatric/Behavioral: The patient is nervous/anxious.   All other systems reviewed and are negative.   Blood pressure 136/79, pulse 81, temperature 98.8 F (37.1 C), temperature source Axillary, resp. rate 15, height 5\' 2"  (1.575 m), weight 68.4 kg, SpO2 97 %.Body mass index is 27.58 kg/m.  General Appearance: Fairly Groomed  Eye Contact:  Good  Speech:  Clear and Coherent and Normal Rate  Volume:  Normal  Mood:  Anxious  Affect:  Appropriate  Thought Process:  Coherent, Linear and Descriptions of Associations: Intact  Orientation:  Full (Time, Place, and Person)  Thought Content:  Delusions, Ideas of Reference:   Paranoia Delusions, Ilusions, Paranoid Ideation and Rumination  Suicidal Thoughts:  No  Homicidal Thoughts:  No  Memory:  Immediate;   Fair Recent;   Fair  Judgement:  Impaired  Insight:  Shallow  Psychomotor Activity:  Normal  Concentration:  Concentration: Fair and Attention Span: Fair  Recall:   [employer] of Knowledge:  Fair  Language:  Good  Akathisia:  Negative  Handed:  Right  AIMS (if indicated):     Assets:  Housing Resilience  ADL's:  Intact  Cognition:  WNL  Sleep:      Patient with paranoid schizophrenia that appears to be stable, and compliant with her outpatient psychiatric medications. At baseline she has chronic delusions of paranoia, in which has been difficult to achieve complete cessation. However she continues to lack insight and judgment, in regards to her schizophrenia and continues to exhibit delusions of paranoia that are now impacting her medical health.  She also has limited insight in regards to her lack of neglect and self-care, related to her underlying psychiatric condition.  She has been compliant with her medications, and all outpatient visit follow up. Patient has been living independently in an apartment and has shown no difficulty maintaining her independence prior to now. She denies suicidal ideation, homicidal ideation and or auditory visual hallucinations. Patient appears to be psychiatrically stable at her baseline, will continue to monitor every day or every other day at request of her attending.   Treatment Plan Summary: Daily contact with patient to assess and evaluate symptoms and progress in treatment, Medication management and Plan Will resume home medications at this time, with some appropriate dose adjustments. See below.  Patient QTc prolonged at 589, will order serial EKG to closely monitor at this time. Previous EKG shows QTc average of 440 in March 2021 and October 2021. QTc prolongation is likely secondary to electrolyte abnormalities. - If QTc is < 500 may start antipsychotic medications : Saphris 10mg  po BID, Paliperdone 3mg  po qhs to further target chronic delusions. Writer was able to speak with her outpatient psychiatrist who reports Saphris worked for quite sometime however recently has not been as effective.  - Will resume Gabapentin  (reduced dose) , Valium, Prazosin (reduced dose), Trazodone (reduced dose) and Vyvanse (reduced dose). There is concern regarding her noncompliance of medications 2/t paranoia will resume at low doses. Considering her severe dehydration and starvation, will reduce dose of Vyvanse 20 at this time as not precipitate withdrawal as she has been taking this medication for a lengthy period of time.  -UDS positive for Amphetamines and Benzodiazepines(she is prescribed both).  -Patient appears to be psychiatrically stable at her baseline, will continue to monitor every day or every other day at request of her attending.  Patient is under IVC at this time, does not appear to be a risk to self or others.   Disposition: No evidence of imminent risk to self or others at present.   Will cotninue to follow, at current patient has paranoid delusions that are chronic in nature. May require inpatient admission, goal is to initiate medications and discharge home with outpatient follow up.  Condition can be safely managed in an outpatient setting. Patient may require increase in level of care to include [employer] or ACT team.   Continue follow-up with outpatient psychiatric providers.   Suella Broad, FNP 02/20/2020 10:20 AM

## 2020-02-20 NOTE — Progress Notes (Signed)
Initial Nutrition Assessment  DOCUMENTATION CODES:   Not applicable  INTERVENTION:  - will adjust TF regimen: Jevity 1.2 @ 30 ml/hr to advance by 10 ml every 8 hours to reach goal rate of 60 ml/hr x18 hours/day (1800-1200). - at goal rate, this regimen will provide 1296 kcal (81% kcal need), 60 grams protein (80% protein need), and 871 ml free water.   - will order Ensure Enlive BID, each supplement provides 350 kcal and 20 grams of protein--can also be used to provide PO meds.  Monitor magnesium, potassium, and phosphorus daily for at least 3 days, MD to replete as needed, as pt is at risk for refeeding syndrome given unknown amount of time without adequate nutrition.    NUTRITION DIAGNOSIS:   Inadequate oral intake related to acute illness,lethargy/confusion as evidenced by meal completion < 25%.  GOAL:   Patient will meet greater than or equal to 90% of their needs  MONITOR:   PO intake,Supplement acceptance,TF tolerance,Labs,Weight trends  REASON FOR ASSESSMENT:   Malnutrition Screening Tool,Consult Enteral/tube feeding initiation and management  ASSESSMENT:   60 year-old female with very extensive medical history of schizoaffective disorder, anxiety, manic depression, emphysema, HLD, heart attack, GERD, seizures, stroke, arthritis, CHF, CKD, gout, COPD, OSA, and many other ailments. She was brought to the ED due to abnormal behavior with need for inpatient psychiatric treatment. She was not eating for several days and had barricaded herself in her home. She was not taking her psych medications. IVC was initiated by APS.  Patient consumed 10% of lunch and 10% of dinner yesterday and 10% of breakfast this AM. During visit to patient a short time ago, lunch tray was on bedside table and remained untouched.   Patient stated that she was not feeling hungry. She denied any abdominal pain/discomfort or nausea.   Noted that she does not have any teeth. She states that she  previously had dentures but that they were lost during a previous hospitalization and she did not get new ones after that time.   Patient reports that she is non-ambulatory and that she spends her days in a chair and sleeps in a bed at night.   Unable to obtain any other information from patient at this time.   She has small bore NGT in L nare which was placed 2/2 PM. [name] receiving Vital High Protein @ 20 ml/hr with 45 ml Prosource TF BID. This regimen is providing 560 kcal, 64 grams protein, and 401 ml free water.   Weight today is 151 lb and weight is consistent with weights 10/22/18-10/09/19. Weight on 01/02/20 was 157 lb which indicates 6 lb weight loss (3.8% body weight) in the past 1.5 months; not significant for time frame.    Labs reviewed; CBGs: 109, 98, 83 mg/dl, Na: 134 mmol/l, BUN: 59 mg/dl, creatinine: 4.6 mg/dl, Ca: 7.9 mg/dl, GFR: 10 ml/min.  Medications reviewed; 1 mg folvite/day, sliding scale novolog, 1 tablet multivitamin with minerals/day, 100 mg thiamine/day.  IVF; LR @ 100 ml/hr.     NUTRITION - FOCUSED PHYSICAL EXAM:  Flowsheet Row Most Recent Value  Orbital Region No depletion  Upper Arm Region No depletion  Thoracic and Lumbar Region Unable to assess  Buccal Region No depletion  Temple Region Mild depletion  Clavicle Bone Region No depletion  Clavicle and Acromion Bone Region Mild depletion  Scapular Bone Region Unable to assess  Dorsal Hand No depletion  Patellar Region No depletion  Anterior Thigh Region No depletion  Posterior Calf  Region No depletion  Edema (RD Assessment) None  Hair Reviewed  Eyes Reviewed  Mouth Reviewed  Skin Reviewed  Nails Reviewed       Diet Order:   Diet Order            DIET SOFT Room service appropriate? Yes; Fluid consistency: Thin  Diet effective now                 EDUCATION NEEDS:   Not appropriate for education at this time  Skin:  Skin Assessment: Skin Integrity Issues: Skin Integrity Issues::  DTI,Stage II DTI: sacrum Stage II: R buttocks  Last BM:  2/4 (type 7 x2)  Height:   Ht Readings from Last 1 Encounters:  02/19/20 5\' 2"  (1.575 m)    Weight:   Wt Readings from Last 1 Encounters:  02/20/20 68.4 kg    Estimated Nutritional Needs:  Kcal:  1600-1800 kcal Protein:  75-85 grams Fluid:  >/= 2 L/day     Jarome Matin, MS, RD, LDN, CNSC Inpatient Clinical Dietitian RD pager # available in [facility]  After hours/weekend pager # available in [facility]

## 2020-02-20 NOTE — Progress Notes (Signed)
[facility] Progress Note Patient Name: Tiffany [name] DOB: [birth year] MRN: [id number]   Date of Service  02/20/2020  HPI/Events of Note  Patient with uncontrolled essential hypertension. Heart rate 74.  eICU Interventions  PRN Hydralazine ordered for SBP > 170 mmHg.        Kerry Kass Ogan 02/20/2020, 5:01 AM

## 2020-02-20 NOTE — Telephone Encounter (Signed)
Referral for CST faxed to [facility]. Awaiting response.

## 2020-02-20 NOTE — Progress Notes (Addendum)
PROGRESS NOTE    Tiffany [name]  JAS:[id number] DOB: 07/20/1960 DOA: 02/18/2020 PCP: [name], MD    Chief Complaint  Patient presents with  . Psychiatric Evaluation    Brief Narrative:  Tiffany [name] is a 60 y/o woman with a history of schizoaffective disorder who was brought to the hospital for abnormal behavior due to need for inpatient psychiatric treatment. Per ED records she has been not eating for several days at home and has been barricading herself inside. She has not been taking her psychiatric medications. IVC initiated by APS. Her initial labs were significantly abnormal with AKI, hypokalemia, hypomagnesemia, incompletely compensated metabolic acidosis, hypoglycemia. She was evaluated in the ED and started on aggressive IVF resuscitation, and electrolyte repletion. Patient on admission was unable to provide much history. Patient was brought in an IVC for hallucinations, paranoid behavior.  Patient admitted to the critical care team, nephrology consulted due to acute renal failure, trended to place for feeding. Patient's electrolytes repleted. Patient transferred to [facility] team 02/20/2020.    Assessment & Plan:   Principal Problem:   ARF (acute renal failure) (HCC) Active Problems:   Type 2 diabetes mellitus (HCC)   Dyslipidemia   COPD (chronic obstructive pulmonary disease) (HCC)   Tobacco use disorder   Hyponatremia   Essential hypertension   Hypokalemia   CKD (chronic kidney disease) stage 3, GFR 30-59 ml/min (HCC)   Hypomagnesemia   Gout   Drug abuse (HCC)   Schizoaffective disorder, bipolar type (HCC)   Acute lower UTI   Acute metabolic encephalopathy   Psychosis (HCC)   Schizophrenia, schizo-affective (HCC)   Metabolic acidosis  1 acute renal failure on chronic kidney disease stage IIIb (baseline creatinine 1.2-1.5) Likely secondary to prerenal azotemia in the setting of poor oral intake, NSAID use. Creatinine on admission was 6.26. Urinalysis  done with large leukocytes, nitrite negative, 100 protein, 21-50 WBCs. Urine sodium noted at 17. Urine creatinine at 300.84. Urine osmolality of 300. Renal ultrasound done was negative for hydronephrosis, showed increased echogenicity of bilateral kidneys likely reflecting medical renal disease, large cystic structure superior to the bladder not significant change in comparison to prior imaging. Patient placed on IV fluids. Patient with urine output of 625 cc over the past 24 hours. Creatinine slowly trending down currently at 4.60 today from 6.26 on admission. Continue IV fluids with LR at 100 cc/h. Nephrology following and appreciate input and recommendations.  2. Hypokalemia/hypomagnesemia Likely secondary to poor oral intake. Repleted. Follow.  3. Hyperphosphatemia Secondary to problem #1. Improved.  4. Acute metabolic acidosis/high anion gap Likely secondary to problem #1 and probable starvation ketosis. Lactate normal. UDS was positive for benzodiazepines and amphetamines. Salicylate levels normal. Patient initially on a bicarb drip currently on LR. Patient on tube feeds with trickle feeds. Patient with poor oral intake. Improved with hydration. Follow.  5. Hypotension Likely secondary to volume depletion. Improved with hydration. Follow.  6. Probable UTI Preliminary urine cultures with > 100,000 colonies of gram-negative rods. Placed on IV Rocephin pending finalization of cultures. Follow.  7. Hypocalcemia Corrected calcium of 8.86. Improved after IV calcium chloride and IV fluids. Follow.  8. Acute metabolic encephalopathy Likely secondary to problem #1 in the setting of schizoaffective disorder. Slowly improving with current treatment of IV fluids. Patient will also be placed on IV Rocephin due to concerns for UTI. Psych consultation pending. Supportive care. Follow.  9. Schizoaffective disorder Patient noted to have a history of schizoaffective disorder with psychosis on  presentation  noted to have barricaded herself inside the house and not eating. Patient stated she felt her son was going to hurt her and still believes he is trying to hurt her. Patient denies any visual hallucinations today. Denies any auditory hallucinations. Patient IVC. Psychiatric consultation pending.  10. Diabetes mellitus type 2 with hypoglycemia Patient noted to be hypoglycemic on presentation likely secondary to poor oral intake. Patient now with a mild hypoglycemia. Check a hemoglobin A1c. Continue SSI.  11. Anemia Likely secondary to malnutrition. Patient with no overt bleeding. Check an anemia panel. Follow H&H. Transfusion threshold hemoglobin < 7.  12. Leukopenia Likely secondary to malnutrition. Improving. Follow.  13. Hyponatremia Likely secondary to hypovolemic hyponatremia. Improving. Follow.  14. Malnutrition Patient with history of significant weight loss. Patient with risk of refeeding syndrome. Continue trickle tube feeds while NG tube present and one more alert regular diet has been ordered. Change diet to a soft diet due to edentulous. Dietitian following.  15. Hypertension Patient noted to be hypotensive on admission and hydrated IV fluids. Blood pressure increasing. Continue Norvasc 5 mg daily and uptitrate as needed for better blood pressure control.  DVT prophylaxis: Heparin Code Status: Full Family Communication: Updated patient. No family at bedside. Disposition:   Status is: Inpatient    Dispo: The patient is from: Home              Anticipated d/c is to: Likely inpatient psychiatry pending psych evaluation.              Anticipated d/c date is: 4 to 5 days              Patient currently with poor oral intake, in stepdown unit, pended tube in place getting tube feeds, acute renal failure on IV fluids, IVCD. Not stable for discharge.   Difficult to place patient undetermined       Consultants:   Psychiatry pending  Nephrology: Dr. Marval Regal  02/18/2020  PCCM admission  Procedures:   Chest x-ray 02/18/2020  Abdominal films 02/18/2020  Renal ultrasound 02/18/2020  Antimicrobials:   IV Rocephin 02/20/2020   Subjective: Patient sleeping but arousable.  More alert today.  States she was concerned that her son was going to hurt her and that is why she barricaded herself at home.  States was taking some of her medications and not others because she had run out.  Patient states had low appetite and was just not hungry and did not eat for about 4 to 5 days.  Complaining of some chest soreness, lower extremity soreness.  Denies any abdominal pain.  States her breathing is okay but close to her baseline.  Poor oral intake this morning per nurse tech at bedside.  Objective: Vitals:   02/20/20 0500 02/20/20 0509 02/20/20 0547 02/20/20 0800  BP:  (!) 183/89 139/74 136/79  Pulse:   80 81  Resp:   19 15  Temp:      TempSrc:      SpO2:   95% 97%  Weight: 68.4 kg     Height:        Intake/Output Summary (Last 24 hours) at 02/20/2020 1019 Last data filed at 02/20/2020 0900 Gross per 24 hour  Intake 2672.07 ml  Output 625 ml  Net 2047.07 ml   Filed Weights   02/19/20 0422 02/20/20 0500  Weight: 62.1 kg 68.4 kg    Examination:  General exam: Appears calm and comfortable. Dry mucous membranes.  NGT in place Respiratory system: Clear to auscultation.  Respiratory effort normal. Cardiovascular system: S1 & S2 heard, RRR. No JVD, murmurs, rubs, gallops or clicks. No pedal edema. Gastrointestinal system: Abdomen is nondistended, soft and nontender. No organomegaly or masses felt. Normal bowel sounds heard. Central nervous system: Alert and oriented. No focal neurological deficits. Extremities: Symmetric 5 x 5 power. Skin: No rashes, lesions or ulcers Psychiatry: Judgement and insight appear poor. Mood & affect flat.     Data Reviewed: I have personally reviewed following labs and imaging studies  CBC: Recent Labs  Lab 02/18/20 1334  02/18/20 2114 02/19/20 0251 02/20/20 0258  WBC 2.7* 2.2* 2.8* 5.3  NEUTROABS 1.8  --   --   --   HGB 10.2* 10.1* 9.1* 8.7*  HCT 34.0* 32.0* 28.9* 26.7*  MCV 92.9 90.7 90.0 87.5  PLT 325 293 299 025    Basic Metabolic Panel: Recent Labs  Lab 02/18/20 1334 02/18/20 1335 02/18/20 1650 02/18/20 2114 02/19/20 0251 02/19/20 1535 02/20/20 0258  NA 138  --   --  136 133*  135 135 134*  K 2.6*  --   --  2.7* 3.1*  3.2* 3.8 3.8  CL 109  --   --  106 103  103 105 103  CO2 13*  --   --  17* 16*  17* 19* 19*  GLUCOSE 63*  --   --  127* 135*  137* 119* 111*  BUN 39*  --   --  41* 40*  41* 48* 59*  CREATININE 6.26*  --   --  5.58* 5.56*  5.57* 5.39* 4.60*  CALCIUM 7.2*  --   --  6.9* 6.6*  6.7* 7.2* 7.9*  MG  --  0.9*  --  1.9 1.8  --  2.0  PHOS  --   --  4.7* 5.9* 4.7*  4.8*  --  2.8    GFR: Estimated Creatinine Clearance: 11.8 mL/min (A) (by C-G formula based on SCr of 4.6 mg/dL (H)).  Liver Function Tests: Recent Labs  Lab 02/18/20 1334 02/19/20 0251  AST 40  --   ALT 31  --   ALKPHOS 152*  --   BILITOT 0.8  --   PROT 6.8  --   ALBUMIN 3.4* 2.8*    CBG: Recent Labs  Lab 02/18/20 1529 02/19/20 1915 02/19/20 2318 02/20/20 0257 02/20/20 0826  GLUCAP 52* 116* 124* 109* 98     Recent Results (from the past 240 hour(s))  Resp Panel by RT-PCR (Flu A&B, Covid) Nasopharyngeal Swab     Status: None   Collection Time: 02/18/20  1:44 PM   Specimen: Nasopharyngeal Swab; Nasopharyngeal(NP) swabs in vial transport medium  Result Value Ref Range Status   SARS Coronavirus 2 by RT PCR NEGATIVE NEGATIVE Final    Comment: (NOTE) SARS-CoV-2 target nucleic acids are NOT DETECTED.  The SARS-CoV-2 RNA is generally detectable in upper respiratory specimens during the acute phase of infection. The lowest concentration of SARS-CoV-2 viral copies this assay can detect is 138 copies/mL. A negative result does not preclude SARS-Cov-2 infection and should not be used as the  sole basis for treatment or other patient management decisions. A negative result may occur with  improper specimen collection/handling, submission of specimen other than nasopharyngeal swab, presence of viral mutation(s) within the areas targeted by this assay, and inadequate number of viral copies(<138 copies/mL). A negative result must be combined with clinical observations, patient history, and epidemiological information. The expected result is Negative.  Fact Sheet for Patients:  EntrepreneurPulse.com.au  Fact Sheet for Healthcare Providers:  IncredibleEmployment.be  This test is no t yet approved or cleared by the [place] FDA and  has been authorized for detection and/or diagnosis of SARS-CoV-2 by FDA under an Emergency Use Authorization (EUA). This EUA will remain  in effect (meaning this test can be used) for the duration of the COVID-19 declaration under Section 564(b)(1) of the Act, 21 U.S.C.section 360bbb-3(b)(1), unless the authorization is terminated  or revoked sooner.       Influenza A by PCR NEGATIVE NEGATIVE Final   Influenza B by PCR NEGATIVE NEGATIVE Final    Comment: (NOTE) The Xpert Xpress SARS-CoV-2/FLU/RSV plus assay is intended as an aid in the diagnosis of influenza from Nasopharyngeal swab specimens and should not be used as a sole basis for treatment. Nasal washings and aspirates are unacceptable for Xpert Xpress SARS-CoV-2/FLU/RSV testing.  Fact Sheet for Patients: EntrepreneurPulse.com.au  Fact Sheet for Healthcare Providers: IncredibleEmployment.be  This test is not yet approved or cleared by the [place] FDA and has been authorized for detection and/or diagnosis of SARS-CoV-2 by FDA under an Emergency Use Authorization (EUA). This EUA will remain in effect (meaning this test can be used) for the duration of the COVID-19 declaration under Section 564(b)(1) of the  Act, 21 U.S.C. section 360bbb-3(b)(1), unless the authorization is terminated or revoked.  Performed at [facility], [place] [address], [place], [place] [address]   MRSA PCR Screening     Status: None   Collection Time: 02/18/20  6:58 PM   Specimen: Nasopharyngeal  Result Value Ref Range Status   MRSA by PCR NEGATIVE NEGATIVE Final    Comment:        The GeneXpert MRSA Assay (FDA approved for NASAL specimens only), is one component of a comprehensive MRSA colonization surveillance program. It is not intended to diagnose MRSA infection nor to guide or monitor treatment for MRSA infections. Performed at [facility], [place] [address] [place], [place] [address]   Urine culture     Status: Abnormal (Preliminary result)   Collection Time: 02/19/20  2:00 AM   Specimen: Urine, Clean Catch  Result Value Ref Range Status   Specimen Description   Final    URINE, CLEAN CATCH Performed at [facility], [place] [address] [place], [place] [address]    Special Requests   Final    NONE Performed at [facility], [place] [address] [place], [place] [address]    Culture (A)  Final    >=100,000 COLONIES/mL GRAM NEGATIVE RODS SUSCEPTIBILITIES TO FOLLOW Performed at [facility], [place] [address], [place], [place] [address]    Report Status PENDING  Incomplete         Radiology Studies: DG Abd 1 View  Result Date: 02/18/2020 CLINICAL DATA:  Feeding tube placement. EXAM: ABDOMEN - 1 VIEW COMPARISON:  None. FINDINGS: Tip of the weighted enteric tube below the diaphragm in the left upper quadrant in the region of the proximal stomach. Tip of the tube is not post pyloric. Slight gaseous distension of transverse colon, no evidence of bowel obstruction. IMPRESSION: Tip of the weighted enteric tube below the diaphragm in the left upper quadrant in the region of the proximal stomach. Electronically Signed   By: Keith Rake M.D.   On: 02/18/2020 20:39   US Renal  Result Date: 02/18/2020 CLINICAL DATA:  AK I EXAM: RENAL / URINARY TRACT ULTRASOUND COMPLETE COMPARISON:  CT abdomen pelvis October 30, 2019 and renal ultrasound October 27, 2019 FINDINGS: Right Kidney: Renal measurements: 9.1 x 4.7 x 4.2 cm = volume: 93 mL. Increased echogenicity. No mass or hydronephrosis visualized. Left Kidney: Renal measurements: 8.9 x 4.6 x 4.5 cm = volume: 97 mL. Increased echogenicity. No mass or hydronephrosis visualized. Bladder: Appears normal for degree of bladder distention. Other: Large cystic structure superior to the bladder which measures 8.0 x 7.3 x 6.2 cm not significant changed in comparison to prior imaging. IMPRESSION: 1. No hydronephrosis. 2. Increased echogenicity of the bilateral kidneys likely reflecting medical renal disease. 3. Large cystic structure superior to the bladder, not significant changed in comparison to prior imaging. Recommend gynecologic referral if not previously performed. Electronically Signed   By: Dahlia Bailiff MD   On: 02/18/2020 17:12   DG Chest Portable 1 View  Result Date: 02/18/2020 CLINICAL DATA:  60 year old female with concern for infection. EXAM: PORTABLE CHEST 1 VIEW COMPARISON:  Chest radiograph dated 10/27/2019. FINDINGS: No focal consolidation, pleural effusion or pneumothorax. Mild cardiomegaly. No acute osseous pathology. Degenerative changes of the spine. IMPRESSION: No active disease. Electronically Signed   By: Anner Crete M.D.   On: 02/18/2020 15:23        Scheduled Meds: . amLODipine  5 mg Oral Daily  . Chlorhexidine Gluconate Cloth  6 each Topical Q0600  . feeding supplement (PROSource TF)  45 mL Per Tube BID  . feeding supplement (VITAL HIGH PROTEIN)  1,000 mL Per Tube Q24H  . folic acid  1 mg Per Tube Daily  . heparin  5,000 Units Subcutaneous Q8H  . insulin aspart  1-3 Units Subcutaneous Q4H  . multivitamin with minerals  1 tablet Per Tube Daily  .  thiamine  100 mg Per Tube Daily   Continuous Infusions: . cefTRIAXone (ROCEPHIN)  IV 1 g (02/20/20 1002)  . lactated ringers 100 mL/hr at 02/20/20 0500     LOS: 2 days    Time spent: 45 minutes    Irine Seal, MD [facility]   To contact the attending provider between 7A-7P or the covering provider during after hours 7P-7A, please log into the web site [email or web address] and access using universal [place] password for that web site. If you do not have the password, please call the hospital operator.  02/20/2020, 10:19 AM

## 2020-02-20 NOTE — Progress Notes (Signed)
Nephrology Progress Note  Patient ID: Tiffany [name], female   DOB: Jun 15, 1960, 60 y.o.   MRN: [id number]   S: She had has 625 mL uop documented over 2/3.  She provides limited history.  States not really eating much and her bedside sitter confirms that she didn't have much breakfast.  Pt states on ibuprofen at home occasionally and I asked her to make sure and stop.  She's been on LR at 100 ml/hr.   Review of systems: limited as she is sleepy  denies shortness of breath or chest pain Not much appetite  States nausea earlier - not now; no vomiting today  O:BP 136/79 (BP Location: Right Arm)   Pulse 81   Temp 98.8 F (37.1 C) (Axillary)   Resp 15   Ht 5\' 2"  (1.575 m)   Wt 68.4 kg   SpO2 97%   BMI 27.58 kg/m   Intake/Output Summary (Last 24 hours) at 02/20/2020 1042 Last data filed at 02/20/2020 0900 Gross per 24 hour  Intake 2672.07 ml  Output 625 ml  Net 2047.07 ml   Intake/Output: I/O last 3 completed shifts: In: 5134.2 [P.O.:960; I.V.:3089.4; NG/GT:321; IV Piggyback:763.8] Out: 1020 [Urine:1020]  Intake/Output this shift:  Total I/O In: 60 [P.O.:60] Out: -  Weight change: 6.3 kg   General adult female in bed in no acute distress HEENT normocephalic atraumatic extraocular movements intact sclera anicteric Neck supple trachea midline Lungs clear to auscultation bilaterally normal work of breathing at rest  Heart regular rate and rhythm no rubs or gallops appreciated Abdomen soft nontender nondistended Extremities no edema  Psych no anxiety or agitation  Neuro sleepy but answers some direct questions    Recent Labs  Lab 02/18/20 1334 02/18/20 1650 02/18/20 2114 02/19/20 0251 02/19/20 1535 02/20/20 0258  NA 138  --  136 133*  135 135 134*  K 2.6*  --  2.7* 3.1*  3.2* 3.8 3.8  CL 109  --  106 103  103 105 103  CO2 13*  --  17* 16*  17* 19* 19*  GLUCOSE 63*  --  127* 135*  137* 119* 111*  BUN 39*  --  41* 40*  41* 48* 59*  CREATININE 6.26*  --  5.58* 5.56*   5.57* 5.39* 4.60*  ALBUMIN 3.4*  --   --  2.8*  --   --   CALCIUM 7.2*  --  6.9* 6.6*  6.7* 7.2* 7.9*  PHOS  --  4.7* 5.9* 4.7*  4.8*  --  2.8  AST 40  --   --   --   --   --   ALT 31  --   --   --   --   --    Liver Function Tests: Recent Labs  Lab 02/18/20 1334 02/19/20 0251  AST 40  --   ALT 31  --   ALKPHOS 152*  --   BILITOT 0.8  --   PROT 6.8  --   ALBUMIN 3.4* 2.8*   CBC: Recent Labs  Lab 02/18/20 1334 02/18/20 2114 02/19/20 0251 02/20/20 0258  WBC 2.7* 2.2* 2.8* 5.3  NEUTROABS 1.8  --   --   --   HGB 10.2* 10.1* 9.1* 8.7*  HCT 34.0* 32.0* 28.9* 26.7*  MCV 92.9 90.7 90.0 87.5  PLT 325 293 299 335   Cardiac Enzymes: Recent Labs  Lab 02/18/20 1650  CKTOTAL 505*   CBG: Recent Labs  Lab 02/18/20 1529 02/19/20 1915 02/19/20 2318 02/20/20  0257 02/20/20 0826  GLUCAP 52* 116* 124* 109* 98    Studies/Results: DG Abd 1 View  Result Date: 02/18/2020 CLINICAL DATA:  Feeding tube placement. EXAM: ABDOMEN - 1 VIEW COMPARISON:  None. FINDINGS: Tip of the weighted enteric tube below the diaphragm in the left upper quadrant in the region of the proximal stomach. Tip of the tube is not post pyloric. Slight gaseous distension of transverse colon, no evidence of bowel obstruction. IMPRESSION: Tip of the weighted enteric tube below the diaphragm in the left upper quadrant in the region of the proximal stomach. Electronically Signed   By: Keith Rake M.D.   On: 02/18/2020 20:39   US Renal  Result Date: 02/18/2020 CLINICAL DATA:  AK I EXAM: RENAL / URINARY TRACT ULTRASOUND COMPLETE COMPARISON:  CT abdomen pelvis October 30, 2019 and renal ultrasound October 27, 2019 FINDINGS: Right Kidney: Renal measurements: 9.1 x 4.7 x 4.2 cm = volume: 93 mL. Increased echogenicity. No mass or hydronephrosis visualized. Left Kidney: Renal measurements: 8.9 x 4.6 x 4.5 cm = volume: 97 mL. Increased echogenicity. No mass or hydronephrosis visualized. Bladder: Appears normal for degree  of bladder distention. Other: Large cystic structure superior to the bladder which measures 8.0 x 7.3 x 6.2 cm not significant changed in comparison to prior imaging. IMPRESSION: 1. No hydronephrosis. 2. Increased echogenicity of the bilateral kidneys likely reflecting medical renal disease. 3. Large cystic structure superior to the bladder, not significant changed in comparison to prior imaging. Recommend gynecologic referral if not previously performed. Electronically Signed   By: Dahlia Bailiff MD   On: 02/18/2020 17:12   DG Chest Portable 1 View  Result Date: 02/18/2020 CLINICAL DATA:  60 year old female with concern for infection. EXAM: PORTABLE CHEST 1 VIEW COMPARISON:  Chest radiograph dated 10/27/2019. FINDINGS: No focal consolidation, pleural effusion or pneumothorax. Mild cardiomegaly. No acute osseous pathology. Degenerative changes of the spine. IMPRESSION: No active disease. Electronically Signed   By: Anner Crete M.D.   On: 02/18/2020 15:23   . amLODipine  5 mg Oral Daily  . Chlorhexidine Gluconate Cloth  6 each Topical Q0600  . feeding supplement (PROSource TF)  45 mL Per Tube BID  . feeding supplement (VITAL HIGH PROTEIN)  1,000 mL Per Tube Q24H  . folic acid  1 mg Per Tube Daily  . heparin  5,000 Units Subcutaneous Q8H  . insulin aspart  1-3 Units Subcutaneous Q4H  . multivitamin with minerals  1 tablet Per Tube Daily  . thiamine  100 mg Per Tube Daily    BMET    Component Value Date/Time   NA 134 (L) 02/20/2020 0258   NA 142 11/25/2019 1642   K 3.8 02/20/2020 0258   CL 103 02/20/2020 0258   CO2 19 (L) 02/20/2020 0258   GLUCOSE 111 (H) 02/20/2020 0258   BUN 59 (H) 02/20/2020 0258   BUN 13 11/25/2019 1642   CREATININE 4.60 (H) 02/20/2020 0258   CREATININE 1.22 (H) 02/18/2016 1146   CALCIUM 7.9 (L) 02/20/2020 0258   GFRNONAA 10 (L) 02/20/2020 0258   GFRNONAA 50 (L) 02/18/2016 1146   GFRAA 56 (L) 11/25/2019 1642   GFRAA 57 (L) 02/18/2016 1146   CBC    Component  Value Date/Time   WBC 5.3 02/20/2020 0258   RBC 3.05 (L) 02/20/2020 0258   RBC 3.04 (L) 02/20/2020 0258   HGB 8.7 (L) 02/20/2020 0258   HGB 9.0 (L) 11/25/2019 1642   HCT 26.7 (L) 02/20/2020 0258   HCT  28.2 (L) 11/25/2019 1642   PLT 335 02/20/2020 0258   PLT 375 11/25/2019 1642   MCV 87.5 02/20/2020 0258   MCV 90 11/25/2019 1642   MCH 28.5 02/20/2020 0258   MCHC 32.6 02/20/2020 0258   RDW 14.0 02/20/2020 0258   RDW 15.4 11/25/2019 1642   LYMPHSABS 0.5 (L) 02/18/2020 1334   LYMPHSABS 2.0 11/25/2019 1642   MONOABS 0.3 02/18/2020 1334   EOSABS 0.0 02/18/2020 1334   EOSABS 0.2 11/25/2019 1642   BASOSABS 0.0 02/18/2020 1334   BASOSABS 0.0 11/25/2019 1642     Assessment/Plan: 1.  AKI/CKD stage 3b- unable to obtain a complete history about her presentation.  Telehealth visit with Psych reported only arthralgias, gait problem, and anxiety, no mention of N/V/D or poor po intake.  Given hypotension this is likely volume depletion/pre-renal +/- NSAID use as source of AKI.  Renal US without obstruction.  Baseline Cr appears 1.2 - 1.5 though multiple prior aki events 1. Continue supportive care with IV fluids - LR at 100 ml/hr currently and would continue same 2. Hypokalemia-  Improved with repletion and on LR 3. Anion gap acidosis- presumably due to #1.  Improving; on LR.  Salicylate levels normal.  Lactate <0.3 so agree with Dr. Carlis Abbott may be a component of starvation ketosis (hypokalemia, hypomagnesemia, hypocalcemia, and significant weight loss ~10 kg since July 2021).   4. Hypotension 2/2 volume depletion has improved  5. AMS- possibly acute metabolic encephalopathy.  UDS with benzos and amphetamine.  Hypoglycemia improved.  Note on thiamine and folic acid as well.  6. Schizoaffective disorder (bipolar type or paranoid schizophrenia)- recommend psych eval. 7. Hypocalcemia- improving.  Will check vit D levels. 8. Hypomagnesemia- resolved   Claudia Desanctis, MD 02/20/2020 10:57 AM

## 2020-02-21 ENCOUNTER — Inpatient Hospital Stay (HOSPITAL_COMMUNITY): Payer: 59

## 2020-02-21 LAB — GLUCOSE, CAPILLARY
Glucose-Capillary: 100 mg/dL — ABNORMAL HIGH (ref 70–99)
Glucose-Capillary: 101 mg/dL — ABNORMAL HIGH (ref 70–99)
Glucose-Capillary: 91 mg/dL (ref 70–99)
Glucose-Capillary: 92 mg/dL (ref 70–99)
Glucose-Capillary: 95 mg/dL (ref 70–99)
Glucose-Capillary: 95 mg/dL (ref 70–99)

## 2020-02-21 LAB — RENAL FUNCTION PANEL
Albumin: 2.3 g/dL — ABNORMAL LOW (ref 3.5–5.0)
Anion gap: 8 (ref 5–15)
BUN: 53 mg/dL — ABNORMAL HIGH (ref 6–20)
CO2: 22 mmol/L (ref 22–32)
Calcium: 8.4 mg/dL — ABNORMAL LOW (ref 8.9–10.3)
Chloride: 107 mmol/L (ref 98–111)
Creatinine, Ser: 2.79 mg/dL — ABNORMAL HIGH (ref 0.44–1.00)
GFR, Estimated: 19 mL/min — ABNORMAL LOW (ref >60)
Glucose, Bld: 106 mg/dL — ABNORMAL HIGH (ref 70–99)
Phosphorus: 1.2 mg/dL — ABNORMAL LOW (ref 2.5–4.6)
Potassium: 3.1 mmol/L — ABNORMAL LOW (ref 3.5–5.1)
Sodium: 137 mmol/L (ref 135–145)

## 2020-02-21 LAB — CBC WITH DIFFERENTIAL/PLATELET
Abs Immature Granulocytes: 0.02 10*3/uL (ref 0.00–0.07)
Basophils Absolute: 0 10*3/uL (ref 0.0–0.1)
Basophils Relative: 1 %
Eosinophils Absolute: 0 10*3/uL (ref 0.0–0.5)
Eosinophils Relative: 1 %
HCT: 26.4 % — ABNORMAL LOW (ref 36.0–46.0)
Hemoglobin: 8.4 g/dL — ABNORMAL LOW (ref 12.0–15.0)
Immature Granulocytes: 1 %
Lymphocytes Relative: 28 %
Lymphs Abs: 1 10*3/uL (ref 0.7–4.0)
MCH: 27.9 pg (ref 26.0–34.0)
MCHC: 31.8 g/dL (ref 30.0–36.0)
MCV: 87.7 fL (ref 80.0–100.0)
Monocytes Absolute: 0.5 10*3/uL (ref 0.1–1.0)
Monocytes Relative: 13 %
Neutro Abs: 2.1 10*3/uL (ref 1.7–7.7)
Neutrophils Relative %: 56 %
Platelets: 297 10*3/uL (ref 150–400)
RBC: 3.01 MIL/uL — ABNORMAL LOW (ref 3.87–5.11)
RDW: 14.4 % (ref 11.5–15.5)
WBC: 3.7 10*3/uL — ABNORMAL LOW (ref 4.0–10.5)
nRBC: 0.8 % — ABNORMAL HIGH (ref 0.0–0.2)

## 2020-02-21 LAB — URINE CULTURE: Culture: >=100000 — AB

## 2020-02-21 LAB — MAGNESIUM: Magnesium: 1.6 mg/dL — ABNORMAL LOW (ref 1.7–2.4)

## 2020-02-21 MED ORDER — FLUTICASONE FUROATE-VILANTEROL 100-25 MCG/INH IN AEPB
1.0000 | INHALATION_SPRAY | Freq: Every day | RESPIRATORY_TRACT | Status: DC
  Administered 2020-02-21 – 2020-02-26 (×6): 1 via RESPIRATORY_TRACT
  Filled 2020-02-21: qty 28

## 2020-02-21 MED ORDER — UMECLIDINIUM BROMIDE 62.5 MCG/INH IN AEPB
1.0000 | INHALATION_SPRAY | Freq: Every day | RESPIRATORY_TRACT | Status: DC
  Administered 2020-02-21 – 2020-02-26 (×6): 1 via RESPIRATORY_TRACT
  Filled 2020-02-21: qty 7

## 2020-02-21 MED ORDER — DIPHENHYDRAMINE HCL 50 MG/ML IJ SOLN
12.5000 mg | Freq: Four times a day (QID) | INTRAMUSCULAR | Status: DC | PRN
  Administered 2020-02-21: 12.5 mg via INTRAVENOUS
  Filled 2020-02-21: qty 1

## 2020-02-21 MED ORDER — HYDRALAZINE HCL 25 MG PO TABS
25.0000 mg | ORAL_TABLET | Freq: Two times a day (BID) | ORAL | Status: DC
  Administered 2020-02-21 – 2020-02-22 (×4): 25 mg via ORAL
  Filled 2020-02-21 (×4): qty 1

## 2020-02-21 MED ORDER — PANTOPRAZOLE SODIUM 40 MG PO TBEC
40.0000 mg | DELAYED_RELEASE_TABLET | Freq: Every day | ORAL | Status: DC
  Administered 2020-02-21 – 2020-02-26 (×6): 40 mg via ORAL
  Filled 2020-02-21 (×6): qty 1

## 2020-02-21 MED ORDER — MAGNESIUM SULFATE 4 GM/100ML IV SOLN
4.0000 g | Freq: Once | INTRAVENOUS | Status: AC
  Administered 2020-02-21: 4 g via INTRAVENOUS
  Filled 2020-02-21: qty 100

## 2020-02-21 MED ORDER — FLUTICASONE-UMECLIDIN-VILANT 100-62.5-25 MCG/INH IN AEPB: 1.0000 | INHALATION_SPRAY | Freq: Every day | RESPIRATORY_TRACT | Status: DC

## 2020-02-21 MED ORDER — POTASSIUM CHLORIDE 20 MEQ PO PACK
40.0000 meq | PACK | ORAL | Status: AC
  Administered 2020-02-21 (×2): 40 meq via ORAL
  Filled 2020-02-21 (×2): qty 2

## 2020-02-21 MED ORDER — BISACODYL 10 MG RE SUPP: 10.0000 mg | Freq: Once | RECTAL | Status: DC

## 2020-02-21 MED ORDER — SORBITOL 70 % SOLN
30.0000 mL | Freq: Once | Status: AC
  Administered 2020-02-21: 30 mL via ORAL
  Filled 2020-02-21 (×2): qty 30

## 2020-02-21 MED ORDER — POTASSIUM PHOSPHATES 15 MMOLE/5ML IV SOLN
30.0000 mmol | Freq: Once | INTRAVENOUS | Status: AC
  Administered 2020-02-21: 30 mmol via INTRAVENOUS
  Filled 2020-02-21: qty 10

## 2020-02-21 MED ORDER — AMLODIPINE BESYLATE 10 MG PO TABS
10.0000 mg | ORAL_TABLET | Freq: Every day | ORAL | Status: DC
  Administered 2020-02-21 – 2020-02-26 (×6): 10 mg via ORAL
  Filled 2020-02-21 (×6): qty 1

## 2020-02-21 MED ORDER — SACCHAROMYCES BOULARDII 250 MG PO CAPS
250.0000 mg | ORAL_CAPSULE | Freq: Two times a day (BID) | ORAL | Status: DC
  Administered 2020-02-21 – 2020-02-26 (×11): 250 mg via ORAL
  Filled 2020-02-21 (×11): qty 1

## 2020-02-21 MED ORDER — ALBUTEROL SULFATE HFA 108 (90 BASE) MCG/ACT IN AERS
2.0000 | INHALATION_SPRAY | Freq: Four times a day (QID) | RESPIRATORY_TRACT | Status: DC | PRN
  Filled 2020-02-21: qty 6.7

## 2020-02-21 MED ORDER — MONTELUKAST SODIUM 10 MG PO TABS
10.0000 mg | ORAL_TABLET | Freq: Every day | ORAL | Status: DC
  Administered 2020-02-21 – 2020-02-25 (×5): 10 mg via ORAL
  Filled 2020-02-21 (×5): qty 1

## 2020-02-21 NOTE — Progress Notes (Signed)
PROGRESS NOTE    Tiffany [name]  XNA:[id number] DOB: 09/22/1960 DOA: 02/18/2020 PCP: [name], MD    Chief Complaint  Patient presents with  . Psychiatric Evaluation    Brief Narrative:  Tiffany [name] is a 60 y/o woman with a history of schizoaffective disorder who was brought to the hospital for abnormal behavior due to need for inpatient psychiatric treatment. Per ED records she has been not eating for several days at home and has been barricading herself inside. She has not been taking her psychiatric medications. IVC initiated by APS. Her initial labs were significantly abnormal with AKI, hypokalemia, hypomagnesemia, incompletely compensated metabolic acidosis, hypoglycemia. She was evaluated in the ED and started on aggressive IVF resuscitation, and electrolyte repletion. Patient on admission was unable to provide much history. Patient was brought in an IVC for hallucinations, paranoid behavior.  Patient admitted to the critical care team, nephrology consulted due to acute renal failure, trended to place for feeding. Patient's electrolytes repleted. Patient transferred to [facility] team 02/20/2020.    Assessment & Plan:   Principal Problem:   ARF (acute renal failure) (HCC) Active Problems:   Type 2 diabetes mellitus (HCC)   Dyslipidemia   COPD (chronic obstructive pulmonary disease) (HCC)   Tobacco use disorder   Hyponatremia   Essential hypertension   Hypokalemia   CKD (chronic kidney disease) stage 3, GFR 30-59 ml/min (HCC)   Hypomagnesemia   Gout   Drug abuse (HCC)   Schizoaffective disorder, bipolar type (HCC)   E. coli UTI   Acute metabolic encephalopathy   Psychosis (HCC)   Schizophrenia, schizo-affective ([place])   Metabolic acidosis   Hypocalcemia  1 acute renal failure on chronic kidney disease stage IIIb (baseline creatinine 1.2-1.5) Likely secondary to prerenal azotemia in the setting of poor oral intake, NSAID use. Creatinine on admission was 6.26.  Urinalysis done with large leukocytes, nitrite negative, 100 protein, 21-50 WBCs. Urine sodium noted at 17. Urine creatinine at 300.84. Urine osmolality of 300. Renal ultrasound done was negative for hydronephrosis, showed increased echogenicity of bilateral kidneys likely reflecting medical renal disease, large cystic structure superior to the bladder not significant change in comparison to prior imaging. Patient placed on IV fluids. Patient with urine output of 3 L over the past 24 hours.  Renal function improving creatinine down to 2.79 from 4.60 from 6.26 on admission.  Continue current rate of IV fluids with LR at 100 cc/h.  Per nephrology.   2. Hypokalemia/hypomagnesemia Likely secondary to poor oral intake.  Potassium packet 40 mEq every 4 hours x 2 doses.  Magnesium at 1.6.  Magnesium sulfate 4 g IV x1.  Follow.    3. Hyperphosphatemia>>>> hypophosphatemia Secondary to problem #1.  Patient now with hypophosphatemia.  K-Phos 30 mmol IV x1.  Follow.  4. Acute metabolic acidosis/high anion gap Likely secondary to problem #1 and probable starvation ketosis. Lactate normal. UDS was positive for benzodiazepines and amphetamines. Salicylate levels normal. Patient initially on a bicarb drip currently on LR. Patient on tube feeds with trickle feeds. Patient with poor oral intake. Improved with hydration. Follow.  5. Hypotension Likely secondary to volume depletion. Improved with hydration. Follow.  6.  E. coli UTI Urine cultures with > 100,000 colonies of E. coli resistant to ampicillin and Bactrim, sensitive to the cephalosporins, Cipro, gent..  Continue IV Rocephin.  Follow.   7. Hypocalcemia Corrected calcium of 9.76. Improved after IV calcium chloride and IV fluids. Follow.  8. Acute metabolic encephalopathy Likely secondary to  problem #1 in the setting of schizoaffective disorder. Slowly improving with current treatment of IV fluids.  Continue IV Rocephin for E. coli UTI.  Psychiatry  following and adjusting psychiatric medications.  Supportive care.  Follow.    9. Schizoaffective disorder Patient noted to have a history of schizoaffective disorder with psychosis on presentation noted to have barricaded herself inside the house and not eating. Patient stated she felt her son was going to hurt her and still believes he is trying to hurt her. Patient denied any visual hallucinations, denied any auditory hallucinations.  On IVC.  Psychiatry has been consulted and are following and managing/adjusting psychiatric medications.  Per psychiatry patient with no evidence of imminent risk to self or others at present, may need outpatient follow-up/management.  Appreciate psychiatry input and recommendations.  10.  Well-controlled diabetes mellitus type 2 with hypoglycemia Patient noted to be hypoglycemic on presentation likely secondary to poor oral intake. Patient now with a mild hypoglycemia.  CBG 101 this morning.  Patient on tube feeds.  Hemoglobin A1c 4.8.  Discontinue sliding scale insulin.  11. Anemia/folate deficiency Likely secondary to malnutrition. Patient with no overt bleeding.  Hemoglobin currently at 8.4.  Anemia panel with a folate deficiency and also consistent with anemia of chronic disease.  Continue folic acid 1 mg daily.  Follow H&H.  Transfusion threshold hemoglobin < 7.  12. Leukopenia Likely secondary to malnutrition. Improving. Follow.  13. Hyponatremia Likely secondary to hypovolemic hyponatremia.  Improved with hydration.  14. Malnutrition Patient with history of significant weight loss. Patient with risk of refeeding syndrome. Continue trickle tube feeds while NG tube present and one more alert regular diet has been ordered.  Continue soft diet.  Replete electrolytes.  Dietitian following.   15. Hypertension Patient noted to be hypotensive on admission and hydrated IV fluids. Blood pressure increasing.  Increase Norvasc to 10 mg daily.  Place on hydralazine  25 mg twice daily.    DVT prophylaxis: Heparin Code Status: Full Family Communication: Updated patient. No family at bedside. Disposition:   Status is: Inpatient    Dispo: The patient is from: Home              Anticipated d/c is to: Likely inpatient psychiatry pending psych recommendations.              Anticipated d/c date is: 4 to 5 days              Patient currently with poor oral intake, in stepdown unit, panda tube in place getting tube feeds due to poor oral intake, acute renal failure on IV fluids, IVCD. Not stable for discharge.   Difficult to place patient undetermined       Consultants:   Psychiatry: Sheran Fava, [place] 02/20/2020  Nephrology: Dr. Marval Regal 02/18/2020  PCCM admission  Procedures:   Chest x-ray 02/18/2020  Abdominal films 02/18/2020  Renal ultrasound 02/18/2020  Antimicrobials:   IV Rocephin 02/20/2020   Subjective: Patient asleep deeply.  Arousable however drowsy.  Per nurse tech/sitter at bedside patient ate some of her breakfast today.   Objective: Vitals:   02/21/20 0500 02/21/20 0609 02/21/20 0700 02/21/20 0800  BP: (!) 169/91 (!) 175/115 (!) 144/77 (!) 148/81  Pulse: 84     Resp: 18     Temp:    98.4 F (36.9 C)  TempSrc:    Oral  SpO2: 96%     Weight: 70 kg     Height:  Intake/Output Summary (Last 24 hours) at 02/21/2020 1036 Last data filed at 02/21/2020 0906 Gross per 24 hour  Intake 4430.85 ml  Output 3550 ml  Net 880.85 ml   Filed Weights   02/19/20 0422 02/20/20 0500 02/21/20 0500  Weight: 62.1 kg 68.4 kg 70 kg    Examination:  General exam: NG tube in place.  Dry mucous membranes.  Asleep.  Respiratory system: CTA B anterior lung fields.  No wheezes, no crackles, no rhonchi.  Normal respiratory effort.  Cardiovascular system: Regular rate rhythm no murmurs rubs or gallops.  No JVD.  No lower extremity edema.   Gastrointestinal system: Abdomen is soft, nondistended, some diffuse tenderness to palpation.   No rebound.  No guarding.  Central nervous system: Asleep. No focal neurological deficits. Extremities: Symmetric 5 x 5 power. Skin: No rashes, lesions or ulcers Psychiatry: Judgement and insight appear poor. Mood & affect flat.     Data Reviewed: I have personally reviewed following labs and imaging studies  CBC: Recent Labs  Lab 02/18/20 1334 02/18/20 2114 02/19/20 0251 02/20/20 0258 02/21/20 0253  WBC 2.7* 2.2* 2.8* 5.3 3.7*  NEUTROABS 1.8  --   --   --  2.1  HGB 10.2* 10.1* 9.1* 8.7* 8.4*  HCT 34.0* 32.0* 28.9* 26.7* 26.4*  MCV 92.9 90.7 90.0 87.5 87.7  PLT 325 293 299 335 742    Basic Metabolic Panel: Recent Labs  Lab 02/18/20 1335 02/18/20 1650 02/18/20 2114 02/19/20 0251 02/19/20 1535 02/20/20 0258 02/21/20 0253  NA  --   --  136 133*  135 135 134* 137  K  --   --  2.7* 3.1*  3.2* 3.8 3.8 3.1*  CL  --   --  106 103  103 105 103 107  CO2  --   --  17* 16*  17* 19* 19* 22  GLUCOSE  --   --  127* 135*  137* 119* 111* 106*  BUN  --   --  41* 40*  41* 48* 59* 53*  CREATININE  --   --  5.58* 5.56*  5.57* 5.39* 4.60* 2.79*  CALCIUM  --   --  6.9* 6.6*  6.7* 7.2* 7.9* 8.4*  MG 0.9*  --  1.9 1.8  --  2.0 1.6*  PHOS  --  4.7* 5.9* 4.7*  4.8*  --  2.8 1.2*    GFR: Estimated Creatinine Clearance: 19.7 mL/min (A) (by C-G formula based on SCr of 2.79 mg/dL (H)).  Liver Function Tests: Recent Labs  Lab 02/18/20 1334 02/19/20 0251 02/21/20 0253  AST 40  --   --   ALT 31  --   --   ALKPHOS 152*  --   --   BILITOT 0.8  --   --   PROT 6.8  --   --   ALBUMIN 3.4* 2.8* 2.3*    CBG: Recent Labs  Lab 02/20/20 1543 02/20/20 1926 02/20/20 2322 02/21/20 0335 02/21/20 0754  GLUCAP 92 107* 109* 101* 91     Recent Results (from the past 240 hour(s))  Resp Panel by RT-PCR (Flu A&B, Covid) Nasopharyngeal Swab     Status: None   Collection Time: 02/18/20  1:44 PM   Specimen: Nasopharyngeal Swab; Nasopharyngeal(NP) swabs in vial transport medium  Result  Value Ref Range Status   SARS Coronavirus 2 by RT PCR NEGATIVE NEGATIVE Final    Comment: (NOTE) SARS-CoV-2 target nucleic acids are NOT DETECTED.  The SARS-CoV-2 RNA is generally detectable in  upper respiratory specimens during the acute phase of infection. The lowest concentration of SARS-CoV-2 viral copies this assay can detect is 138 copies/mL. A negative result does not preclude SARS-Cov-2 infection and should not be used as the sole basis for treatment or other patient management decisions. A negative result may occur with  improper specimen collection/handling, submission of specimen other than nasopharyngeal swab, presence of viral mutation(s) within the areas targeted by this assay, and inadequate number of viral copies(<138 copies/mL). A negative result must be combined with clinical observations, patient history, and epidemiological information. The expected result is Negative.  Fact Sheet for Patients:  EntrepreneurPulse.com.au  Fact Sheet for Healthcare Providers:  IncredibleEmployment.be  This test is no t yet approved or cleared by the [place] FDA and  has been authorized for detection and/or diagnosis of SARS-CoV-2 by FDA under an Emergency Use Authorization (EUA). This EUA will remain  in effect (meaning this test can be used) for the duration of the COVID-19 declaration under Section 564(b)(1) of the Act, 21 U.S.C.section 360bbb-3(b)(1), unless the authorization is terminated  or revoked sooner.       Influenza A by PCR NEGATIVE NEGATIVE Final   Influenza B by PCR NEGATIVE NEGATIVE Final    Comment: (NOTE) The Xpert Xpress SARS-CoV-2/FLU/RSV plus assay is intended as an aid in the diagnosis of influenza from Nasopharyngeal swab specimens and should not be used as a sole basis for treatment. Nasal washings and aspirates are unacceptable for Xpert Xpress SARS-CoV-2/FLU/RSV testing.  Fact Sheet for  Patients: EntrepreneurPulse.com.au  Fact Sheet for Healthcare Providers: IncredibleEmployment.be  This test is not yet approved or cleared by the [place] FDA and has been authorized for detection and/or diagnosis of SARS-CoV-2 by FDA under an Emergency Use Authorization (EUA). This EUA will remain in effect (meaning this test can be used) for the duration of the COVID-19 declaration under Section 564(b)(1) of the Act, 21 U.S.C. section 360bbb-3(b)(1), unless the authorization is terminated or revoked.  Performed at [facility], [place] [address] [place], [place] [address]   MRSA PCR Screening     Status: None   Collection Time: 02/18/20  6:58 PM   Specimen: Nasopharyngeal  Result Value Ref Range Status   MRSA by PCR NEGATIVE NEGATIVE Final    Comment:        The GeneXpert MRSA Assay (FDA approved for NASAL specimens only), is one component of a comprehensive MRSA colonization surveillance program. It is not intended to diagnose MRSA infection nor to guide or monitor treatment for MRSA infections. Performed at [facility], [place] [address] [place], [place] [address]   Urine culture     Status: Abnormal   Collection Time: 02/19/20  2:00 AM   Specimen: Urine, Clean Catch  Result Value Ref Range Status   Specimen Description   Final    URINE, CLEAN CATCH Performed at [facility], [place] [address], [place], [place] [address]    Special Requests   Final    NONE Performed at [facility], [place] [address], [place], [place] [address]    Culture >=100,000 COLONIES/mL ESCHERICHIA COLI (A)  Final   Report Status 02/21/2020 FINAL  Final   Organism ID, Bacteria ESCHERICHIA COLI (A)  Final      Susceptibility   Escherichia coli - MIC*    AMPICILLIN >=32 RESISTANT Resistant     CEFAZOLIN <=4 SENSITIVE Sensitive     CEFEPIME <=0.12 SENSITIVE Sensitive  CEFTRIAXONE  <=0.25 SENSITIVE Sensitive     CIPROFLOXACIN 1 SENSITIVE Sensitive     GENTAMICIN <=1 SENSITIVE Sensitive     IMIPENEM <=0.25 SENSITIVE Sensitive     NITROFURANTOIN <=16 SENSITIVE Sensitive     TRIMETH/SULFA >=320 RESISTANT Resistant     AMPICILLIN/SULBACTAM 16 INTERMEDIATE Intermediate     PIP/TAZO <=4 SENSITIVE Sensitive     * >=100,000 COLONIES/mL ESCHERICHIA COLI         Radiology Studies: No results found.      Scheduled Meds: . amLODipine  10 mg Oral Daily  . Chlorhexidine Gluconate Cloth  6 each Topical Q0600  . feeding supplement  237 mL Oral BID BM  . umeclidinium bromide  1 puff Inhalation Daily   And  . fluticasone furoate-vilanterol  1 puff Inhalation Daily  . folic acid  1 mg Per Tube Daily  . gabapentin  300 mg Oral Daily  . heparin  5,000 Units Subcutaneous Q8H  . hydrALAZINE  25 mg Oral BID  . insulin aspart  1-3 Units Subcutaneous Q4H  . lisdexamfetamine  20 mg Oral Q breakfast  . montelukast  10 mg Oral QHS  . multivitamin with minerals  1 tablet Per Tube Daily  . pantoprazole  40 mg Oral Daily  . potassium chloride  40 mEq Oral Q4H  . prazosin  2 mg Oral QHS  . saccharomyces boulardii  250 mg Oral BID  . thiamine  100 mg Per Tube Daily  . traZODone  100 mg Oral QHS   Continuous Infusions: . cefTRIAXone (ROCEPHIN)  IV Stopped (02/21/20 1029)  . feeding supplement (JEVITY 1.2 CAL) 60 mL/hr at 02/20/20 1816  . lactated ringers 100 mL/hr at 02/21/20 0938  . magnesium sulfate bolus IVPB    . potassium PHOSPHATE IVPB (in mmol)       LOS: 3 days    Time spent: 35 minutes    Irine Seal, MD [facility]   To contact the attending provider between 7A-7P or the covering provider during after hours 7P-7A, please log into the web site [email or web address] and access using universal [place] password for that web site. If you do not have the password, please call the hospital operator.  02/21/2020, 10:36 AM

## 2020-02-21 NOTE — Consult Note (Addendum)
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 that are now impacting her medical health.  She also has limited insight in regards to her lack of neglect and self-care, related to her underlying psychiatric condition.  She has been compliant with her medications, and all outpatient visit follow up. Patient has been living independently in an apartment and has shown no difficulty maintaining her independence prior to now. She denies suicidal ideation, homicidal ideation and or auditory visual hallucinations. Patient appears to be psychiatrically stable at her  baseline, will continue to monitor every day or every other day at request of her attending.   Treatment Plan Summary: Daily contact with patient to assess and evaluate symptoms and progress in treatment, Medication management and Plan Will resume home medications at this time, with some appropriate dose adjustments. See below.  Patient QTc prolonged at 589, will order serial EKG to closely monitor at this time. Previous EKG shows QTc average of 440 in March 2021 and October 2021. QTc prolongation is likely secondary to electrolyte abnormalities. - If QTc is < 500 may start antipsychotic medications : Saphris 10mg  po BID, Paliperdone 3mg  po qhs to further target chronic delusions. NP Starkes-Perry was able to speak with her outpatient psychiatrist who reports Saphris worked for quite sometime however recently has not been as effective.  - Will resume Gabapentin (reduced dose) , Valium, Prazosin (reduced dose), Trazodone (reduced dose) and Vyvanse (reduced dose). There is concern regarding her noncompliance of medications 2/t paranoia will resume at low doses. Considering her severe dehydration and starvation, will reduce dose of Vyvanse 20 at this time as not precipitate withdrawal as she has been taking this medication for a lengthy period of time.  -UDS positive for Amphetamines and Benzodiazepines(she is prescribed both).  -Patient appears to be psychiatrically stable at her baseline, will continue to monitor every day or every other day at request of her attending.  Patient is under IVC at this time, does not appear to be a risk to self or others but recommend to keep in place for now in the event patient attempts to leave as she does need medical treatment and is paranoid at baseline.   Disposition: No evidence of imminent risk to self or others at present.   Will cotninue to follow, at current patient has paranoid delusions that are chronic in nature. May require inpatient admission, goal is to  initiate medications and discharge home with outpatient follow up.  Condition can be safely managed in an outpatient setting. Patient may require increase in level of care to include [employer] or ACT team.   Continue follow-up with outpatient psychiatric providers.   Discussed plan with Dr. Grandville Silos over secure chat. Psychiatry to follow up Monday  Ival Bible, MD 02/21/2020 1:25 PM

## 2020-02-21 NOTE — Progress Notes (Signed)
Patient ID: Tiffany [name], female   DOB: [birth year], 60 y.o.   MRN: [id number] S: More awake and alert today.  PCT tells me she ate well today. O:BP (!) 148/81 (BP Location: Right Arm)   Pulse 84   Temp 98.5 F (36.9 C) (Axillary)   Resp 18   Ht 5\' 2"  (1.575 m)   Wt 70 kg   SpO2 96%   BMI 28.23 kg/m   Intake/Output Summary (Last 24 hours) at 02/21/2020 1307 Last data filed at 02/21/2020 1256 Gross per 24 hour  Intake 4310.85 ml  Output 3100 ml  Net 1210.85 ml   Intake/Output: I/O last 3 completed shifts: In: 6064.5 [P.O.:1560; I.V.:3560.5; NG/GT:844; IV Piggyback:100] Out: 3500 [Urine:3500]  Intake/Output this shift:  Total I/O In: 240 [P.O.:240] Out: 550 [Urine:550] Weight change: 1.6 kg Gen: NAD, slowed mentation CVS: RRR Resp: CTA Abd: +BS, soft, NT/ND Ext: no edema  Recent Labs  Lab 02/18/20 1334 02/18/20 1650 02/18/20 2114 02/19/20 0251 02/19/20 1535 02/20/20 0258 02/21/20 0253  NA 138  --  136 133*  135 135 134* 137  K 2.6*  --  2.7* 3.1*  3.2* 3.8 3.8 3.1*  CL 109  --  106 103  103 105 103 107  CO2 13*  --  17* 16*  17* 19* 19* 22  GLUCOSE 63*  --  127* 135*  137* 119* 111* 106*  BUN 39*  --  41* 40*  41* 48* 59* 53*  CREATININE 6.26*  --  5.58* 5.56*  5.57* 5.39* 4.60* 2.79*  ALBUMIN 3.4*  --   --  2.8*  --   --  2.3*  CALCIUM 7.2*  --  6.9* 6.6*  6.7* 7.2* 7.9* 8.4*  PHOS  --  4.7* 5.9* 4.7*  4.8*  --  2.8 1.2*  AST 40  --   --   --   --   --   --   ALT 31  --   --   --   --   --   --    Liver Function Tests: Recent Labs  Lab 02/18/20 1334 02/19/20 0251 02/21/20 0253  AST 40  --   --   ALT 31  --   --   ALKPHOS 152*  --   --   BILITOT 0.8  --   --   PROT 6.8  --   --   ALBUMIN 3.4* 2.8* 2.3*   No results for input(s): LIPASE, AMYLASE in the last 168 hours. No results for input(s): AMMONIA in the last 168 hours. CBC: Recent Labs  Lab 02/18/20 1334 02/18/20 2114 02/19/20 0251 02/20/20 0258 02/21/20 0253  WBC 2.7* 2.2* 2.8*  5.3 3.7*  NEUTROABS 1.8  --   --   --  2.1  HGB 10.2* 10.1* 9.1* 8.7* 8.4*  HCT 34.0* 32.0* 28.9* 26.7* 26.4*  MCV 92.9 90.7 90.0 87.5 87.7  PLT 325 293 299 335 297   Cardiac Enzymes: Recent Labs  Lab 02/18/20 1650  CKTOTAL 505*   CBG: Recent Labs  Lab 02/20/20 1926 02/20/20 2322 02/21/20 0335 02/21/20 0754 02/21/20 1141  GLUCAP 107* 109* 101* 91 95    Iron Studies:  Recent Labs    02/20/20 0258  IRON 45  TIBC 158*  FERRITIN 154   Studies/Results: DG Abd 2 Views  Result Date: 02/21/2020 CLINICAL DATA:  Abdominal pain. EXAM: ABDOMEN - 2 VIEW COMPARISON:  February 18, 2020. FINDINGS: The bowel gas pattern is normal. There is no  evidence of free air. Distal tip of feeding tube is seen in proximal stomach. No radio-opaque calculi or other significant radiographic abnormality is seen. IMPRESSION: Distal tip of feeding tube seen in proximal stomach. Electronically Signed   By: Marijo Conception M.D.   On: 02/21/2020 12:47   . amLODipine  10 mg Oral Daily  . Chlorhexidine Gluconate Cloth  6 each Topical Q0600  . feeding supplement  237 mL Oral BID BM  . umeclidinium bromide  1 puff Inhalation Daily   And  . fluticasone furoate-vilanterol  1 puff Inhalation Daily  . folic acid  1 mg Per Tube Daily  . gabapentin  300 mg Oral Daily  . heparin  5,000 Units Subcutaneous Q8H  . hydrALAZINE  25 mg Oral BID  . insulin aspart  1-3 Units Subcutaneous Q4H  . lisdexamfetamine  20 mg Oral Q breakfast  . montelukast  10 mg Oral QHS  . multivitamin with minerals  1 tablet Per Tube Daily  . pantoprazole  40 mg Oral Daily  . potassium chloride  40 mEq Oral Q4H  . prazosin  2 mg Oral QHS  . saccharomyces boulardii  250 mg Oral BID  . sorbitol  30 mL Oral Once  . thiamine  100 mg Per Tube Daily  . traZODone  100 mg Oral QHS    BMET    Component Value Date/Time   NA 137 02/21/2020 0253   NA 142 11/25/2019 1642   K 3.1 (L) 02/21/2020 0253   CL 107 02/21/2020 0253   CO2 22  02/21/2020 0253   GLUCOSE 106 (H) 02/21/2020 0253   BUN 53 (H) 02/21/2020 0253   BUN 13 11/25/2019 1642   CREATININE 2.79 (H) 02/21/2020 0253   CREATININE 1.22 (H) 02/18/2016 1146   CALCIUM 8.4 (L) 02/21/2020 0253   GFRNONAA 19 (L) 02/21/2020 0253   GFRNONAA 50 (L) 02/18/2016 1146   GFRAA 56 (L) 11/25/2019 1642   GFRAA 57 (L) 02/18/2016 1146   CBC    Component Value Date/Time   WBC 3.7 (L) 02/21/2020 0253   RBC 3.01 (L) 02/21/2020 0253   HGB 8.4 (L) 02/21/2020 0253   HGB 9.0 (L) 11/25/2019 1642   HCT 26.4 (L) 02/21/2020 0253   HCT 28.2 (L) 11/25/2019 1642   PLT 297 02/21/2020 0253   PLT 375 11/25/2019 1642   MCV 87.7 02/21/2020 0253   MCV 90 11/25/2019 1642   MCH 27.9 02/21/2020 0253   MCHC 31.8 02/21/2020 0253   RDW 14.4 02/21/2020 0253   RDW 15.4 11/25/2019 1642   LYMPHSABS 1.0 02/21/2020 0253   LYMPHSABS 2.0 11/25/2019 1642   MONOABS 0.5 02/21/2020 0253   EOSABS 0.0 02/21/2020 0253   EOSABS 0.2 11/25/2019 1642   BASOSABS 0.0 02/21/2020 0253   BASOSABS 0.0 11/25/2019 1642    Assessment/Plan: 1. AKI/CKD stage 3b- unable to obtain a complete history about her presentation. Telehealth visit with Psych reported only arthralgias, gait problem, and anxiety, no mention of N/V/D or poor po intake. Given hypotension this is likely volume depletion +/- NSAID use as source of AKI.  1. Agree with IVF's, lactated ringers and follow UOP and Scr. 2. Renal US without obstruction 3. Urine studies with low FeNa consistent with prerenal and improving with IVF's 4. Nothing further to add and will sign off.  Please call with questions or concerns. 2. Hypokalemia- improving but will need to continue with repletion. 3. Anion gap acidosis- presumably due to #1. Salicylate levels normal. Start LR  IVF after potassium repleted. Lactate <0.3 so agree with Dr. Carlis Abbott may be a component of starvation ketosis (hypokalemia, hypomagnesemia, hypocalcemia, and significant weight loss ~10 kg since  July 2021). 1. Improving with tube feeds 4. Hypotension- presumably due to volume depletion. Improved with IVF's 5. AMS- possibly acute metabolic encephalopathy but will also order urine drug screen. Also had low glucose and would give 1 amp of D50 and follow. 6. Schizoaffective disorder (bipolar type or paranoid schizophrenia)- recommend psych eval. 7. Hypocalcemia- improved. 8. Hypomagnesemia- improved with IV magnesium.   Donetta Potts, MD [employer] [phone]

## 2020-02-22 LAB — GLUCOSE, CAPILLARY
Glucose-Capillary: 106 mg/dL — ABNORMAL HIGH (ref 70–99)
Glucose-Capillary: 107 mg/dL — ABNORMAL HIGH (ref 70–99)
Glucose-Capillary: 108 mg/dL — ABNORMAL HIGH (ref 70–99)
Glucose-Capillary: 109 mg/dL — ABNORMAL HIGH (ref 70–99)
Glucose-Capillary: 121 mg/dL — ABNORMAL HIGH (ref 70–99)
Glucose-Capillary: 122 mg/dL — ABNORMAL HIGH (ref 70–99)
Glucose-Capillary: 124 mg/dL — ABNORMAL HIGH (ref 70–99)
Glucose-Capillary: 134 mg/dL — ABNORMAL HIGH (ref 70–99)
Glucose-Capillary: 91 mg/dL (ref 70–99)
Glucose-Capillary: 93 mg/dL (ref 70–99)
Glucose-Capillary: 97 mg/dL (ref 70–99)

## 2020-02-22 LAB — RENAL FUNCTION PANEL
Albumin: 2.4 g/dL — ABNORMAL LOW (ref 3.5–5.0)
Anion gap: 11 (ref 5–15)
BUN: 34 mg/dL — ABNORMAL HIGH (ref 6–20)
CO2: 23 mmol/L (ref 22–32)
Calcium: 8.7 mg/dL — ABNORMAL LOW (ref 8.9–10.3)
Chloride: 106 mmol/L (ref 98–111)
Creatinine, Ser: 1.5 mg/dL — ABNORMAL HIGH (ref 0.44–1.00)
GFR, Estimated: 40 mL/min — ABNORMAL LOW (ref >60)
Glucose, Bld: 91 mg/dL (ref 70–99)
Phosphorus: 2.6 mg/dL (ref 2.5–4.6)
Potassium: 3.6 mmol/L (ref 3.5–5.1)
Sodium: 140 mmol/L (ref 135–145)

## 2020-02-22 LAB — CBC WITH DIFFERENTIAL/PLATELET
Abs Immature Granulocytes: 0.09 10*3/uL — ABNORMAL HIGH (ref 0.00–0.07)
Basophils Absolute: 0 10*3/uL (ref 0.0–0.1)
Basophils Relative: 0 %
Eosinophils Absolute: 0.1 10*3/uL (ref 0.0–0.5)
Eosinophils Relative: 3 %
HCT: 28.7 % — ABNORMAL LOW (ref 36.0–46.0)
Hemoglobin: 9 g/dL — ABNORMAL LOW (ref 12.0–15.0)
Immature Granulocytes: 2 %
Lymphocytes Relative: 33 %
Lymphs Abs: 1.6 10*3/uL (ref 0.7–4.0)
MCH: 27.9 pg (ref 26.0–34.0)
MCHC: 31.4 g/dL (ref 30.0–36.0)
MCV: 88.9 fL (ref 80.0–100.0)
Monocytes Absolute: 0.6 10*3/uL (ref 0.1–1.0)
Monocytes Relative: 12 %
Neutro Abs: 2.4 10*3/uL (ref 1.7–7.7)
Neutrophils Relative %: 50 %
Platelets: 300 10*3/uL (ref 150–400)
RBC: 3.23 MIL/uL — ABNORMAL LOW (ref 3.87–5.11)
RDW: 14.8 % (ref 11.5–15.5)
WBC: 4.8 10*3/uL (ref 4.0–10.5)
nRBC: 0.4 % — ABNORMAL HIGH (ref 0.0–0.2)

## 2020-02-22 LAB — MAGNESIUM: Magnesium: 1.9 mg/dL (ref 1.7–2.4)

## 2020-02-22 MED ORDER — THIAMINE HCL 100 MG PO TABS
100.0000 mg | ORAL_TABLET | Freq: Every day | ORAL | Status: DC
Start: 2020-02-23 — End: 2020-02-26
  Administered 2020-02-23 – 2020-02-26 (×4): 100 mg via ORAL
  Filled 2020-02-22 (×4): qty 1

## 2020-02-22 MED ORDER — CEFAZOLIN SODIUM-DEXTROSE 1-4 GM/50ML-% IV SOLN
1.0000 g | Freq: Three times a day (TID) | INTRAVENOUS | Status: AC
  Administered 2020-02-23 – 2020-02-24 (×6): 1 g via INTRAVENOUS
  Filled 2020-02-22 (×6): qty 50

## 2020-02-22 MED ORDER — POTASSIUM CHLORIDE 20 MEQ PO PACK
20.0000 meq | PACK | Freq: Once | ORAL | Status: AC
  Administered 2020-02-22: 20 meq via ORAL
  Filled 2020-02-22: qty 1

## 2020-02-22 MED ORDER — FOLIC ACID 1 MG PO TABS
1.0000 mg | ORAL_TABLET | Freq: Every day | ORAL | Status: DC
Start: 2020-02-23 — End: 2020-02-26
  Administered 2020-02-23 – 2020-02-26 (×4): 1 mg via ORAL
  Filled 2020-02-22 (×4): qty 1

## 2020-02-22 MED ORDER — ADULT MULTIVITAMIN W/MINERALS CH
1.0000 | ORAL_TABLET | Freq: Every day | ORAL | Status: DC
  Administered 2020-02-23 – 2020-02-26 (×4): 1 via ORAL
  Filled 2020-02-22 (×4): qty 1

## 2020-02-22 NOTE — Plan of Care (Signed)
  Problem: Health Behavior/Discharge Planning: Goal: Ability to manage health-related needs will improve Outcome: Not Progressing   Problem: Coping: Goal: Level of anxiety will decrease Outcome: Not Progressing    Problem: Education: Goal: Knowledge of General Education information will improve Description: Including pain rating scale, medication(s)/side effects and non-pharmacologic comfort measures Outcome: Progressing   Problem: Clinical Measurements: Goal: Ability to maintain clinical measurements within normal limits will improve Outcome: Progressing Goal: Will remain free from infection Outcome: Progressing Goal: Diagnostic test results will improve Outcome: Progressing Goal: Respiratory complications will improve Outcome: Progressing Goal: Cardiovascular complication will be avoided Outcome: Progressing   Problem: Activity: Goal: Risk for activity intolerance will decrease Outcome: Progressing   Problem: Nutrition: Goal: Adequate nutrition will be maintained Outcome: Progressing   Problem: Elimination: Goal: Will not experience complications related to bowel motility Outcome: Progressing Goal: Will not experience complications related to urinary retention Outcome: Progressing   Problem: Pain Managment: Goal: General experience of comfort will improve Outcome: Progressing   Problem: Safety: Goal: Ability to remain free from injury will improve Outcome: Progressing   Problem: Skin Integrity: Goal: Risk for impaired skin integrity will decrease Outcome: Progressing   Problem: Education: Goal: Ability to describe self-care measures that may prevent or decrease complications (Diabetes Survival Skills Education) will improve Outcome: Progressing Goal: Individualized Educational Video(s) Outcome: Progressing   Problem: Metabolic: Goal: Ability to maintain appropriate glucose levels will improve Outcome: Progressing   Problem: Nutritional: Goal:  Maintenance of adequate nutrition will improve Outcome: Progressing Goal: Progress toward achieving an optimal weight will improve Outcome: Progressing   Problem: Skin Integrity: Goal: Risk for impaired skin integrity will decrease Outcome: Progressing
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
## 2020-02-22 NOTE — Evaluation (Signed)
Occupational Therapy Evaluation Patient Details Name: Tiffany [name] MRN: [id number] DOB: 1960/12/20 Today's Date: 02/22/2020    History of Present Illness 60 y/o woman with a history of schizoaffective disorder who was brought to the hospital for abnormal behavior, IVC for hallucinations, paranoid behavior.  Per ED records she has been not eating for several days at home and has been barricading herself inside. She has not been taking her psychiatric medications. IVC initiated by APS. Her initial labs were significantly abnormal with AKI, hypokalemia, hypomagnesemia, incompletely compensated metabolic acidosis, hypoglycemia.  Patient on admission was unable to provide much history.   Patient admitted to the critical care team, nephrology consulted due to acute renal failure.  Patient transferred to [facility] team 02/20/2020. Psych consulted   Clinical Impression   Patient lives at home alone with caregiver assist daily for 4.5hrs for IADLs cleaning, cooking. Pt states she goes with a friend that takes her grocery shopping. Pt I with self care, sponge bathes due to fear of falling in bathroom and uses SPC for ambulation. Currently patient demonstrates decreased strength, increased back pain radiating to LEs impacting her activity tolerance and overall functional mobility. Pt require max A for supine to sit with mod cues to sequence. Attempted sit to stand from EOB with RW twice, on second attempt bed height elevated however pt unable to clear buttock from EOB. Pt min A to scoot along EOB then mod A to lift LEs to return to semi-supine. Recommend continued acute OT services to maximize patient strength, endurance, balance in order to facilitate D/C to venue listed below.    Follow Up Recommendations  SNF    Equipment Recommendations  3 in 1 bedside commode       Precautions / Restrictions Precautions Precautions: Fall Precaution Comments: feeding tube Restrictions Weight Bearing  Restrictions: No      Mobility Bed Mobility Overal bed mobility: Needs Assistance Bed Mobility: Supine to Sit;Sit to Supine     Supine to sit: Max assist;HOB elevated Sit to supine: Mod assist   General bed mobility comments: cues to sequence however patient stating "I can't" needing max A for LE and trunk management with use of pad. mod A to lift LEs back onto EOB    Transfers                 General transfer comment: unable, attempted sit to stand 2x once with bed height elevated however patient unable to fully lift buttock off EOB. patient was able to scoot towards [facility] with min A    Balance Overall balance assessment: Needs assistance Sitting-balance support: Feet supported Sitting balance-Leahy Scale: Fair         Standing balance comment: unable                           ADL either performed or assessed with clinical judgement   ADL Overall ADL's : Needs assistance/impaired Eating/Feeding: Independent;Bed level   Grooming: Set up;Supervision/safety;Sitting;Bed level   Upper Body Bathing: Supervision/ safety;Set up;Sitting   Lower Body Bathing: Maximal assistance;Sitting/lateral leans;Bed level   Upper Body Dressing : Supervision/safety;Set up;Sitting   Lower Body Dressing: Total assistance;Sitting/lateral leans Lower Body Dressing Details (indicate cue type and reason): patient with weakness, back/LE pain needing total A to don sock   Toilet Transfer Details (indicate cue type and reason): unable to power up to standing even with bed height elevated, would attempt stedy for next session pending pt progress/pain level  Toileting- Clothing Manipulation and Hygiene: Total assistance;Bed level         General ADL Comments: patient requiring increased assistance for self care tasks due to weakness, pain limiting activity tolerance                  Pertinent Vitals/Pain Pain Assessment: Faces Faces Pain Scale: Hurts even more Pain  Location: back radiating to LEs Pain Descriptors / Indicators: Sharp Pain Intervention(s): Patient requesting pain meds-RN notified     Hand Dominance Right   Extremity/Trunk Assessment Upper Extremity Assessment Upper Extremity Assessment: Generalized weakness   Lower Extremity Assessment Lower Extremity Assessment: Defer to PT evaluation       Communication Communication Communication: No difficulties   Cognition Arousal/Alertness: Awake/alert Behavior During Therapy: Flat affect Overall Cognitive Status: No family/caregiver present to determine baseline cognitive functioning                                 General Comments: patient appears to have poor insight to deficits, states once she gets her cortizone shot she will be fine. otherwise A/O x3              Home Living Family/patient expects to be discharged to:: Private residence Living Arrangements: Alone Available Help at Discharge: Friend(s);Personal care attendant;Available PRN/intermittently Type of Home: House Home Access: Stairs to enter     Home Layout: One level     Bathroom Shower/Tub: Other (comment) (doesn't use)   Bathroom Toilet: Standard     Home Equipment: Cane - single point;Walker - 2 wheels;Walker - 4 wheels          Prior Functioning/Environment Level of Independence: Needs assistance  Gait / Transfers Assistance Needed: ambulates with cane ADL's / Homemaking Assistance Needed: pt reports I with ADLs/sponge bathes, has aide daily for 4.5 hrs to assist with IADLs cooking, cleaning   Comments: reports sleeps in recliner        OT Problem List: Decreased strength;Decreased activity tolerance;Impaired balance (sitting and/or standing);Decreased safety awareness;Obesity;Pain      OT Treatment/Interventions: Self-care/ADL training;Therapeutic exercise;Therapeutic activities;Patient/family education;Balance training;DME and/or AE instruction    OT Goals(Current goals  can be found in the care plan section) Acute Rehab OT Goals Patient Stated Goal: less pain OT Goal Formulation: With patient Time For Goal Achievement: 03/07/20 Potential to Achieve Goals: Good  OT Frequency: Min 2X/week    AM-PAC OT "6 Clicks" Daily Activity     Outcome Measure Help from another person eating meals?: None Help from another person taking care of personal grooming?: A Little Help from another person toileting, which includes using toliet, bedpan, or urinal?: Total Help from another person bathing (including washing, rinsing, drying)?: A Lot Help from another person to put on and taking off regular upper body clothing?: A Little Help from another person to put on and taking off regular lower body clothing?: Total 6 Click Score: 14   End of Session Nurse Communication: Mobility status  Activity Tolerance: Patient limited by pain;Patient limited by fatigue Patient left: in bed;with call bell/phone within reach;with bed alarm set;with nursing/sitter in room  OT Visit Diagnosis: Other abnormalities of gait and mobility (R26.89);Muscle weakness (generalized) (M62.81);Pain Pain - part of body:  (back, B LEs)                Time: 9924-2683 OT Time Calculation (min): 27 min Charges:  OT General Charges $OT Visit: 1 Visit OT Evaluation $OT  Eval Moderate Complexity: 1 Mod OT Treatments $Self Care/Home Management : 8-22 mins  Delbert Phenix OT OT pager: [email or web address] 02/22/2020, 10:43 AM

## 2020-02-22 NOTE — Evaluation (Signed)
Physical Therapy Evaluation Patient Details Name: Tiffany [name] MRN: [id number] DOB: Apr 14, 1960 Today's Date: 02/22/2020   History of Present Illness  60 y/o woman with a history of schizoaffective disorder who was brought to the hospital for abnormal behavior, IVC for hallucinations, paranoid behavior.  Per ED records she has been not eating for several days at home and has been barricading herself inside. She has not been taking her psychiatric medications. IVC initiated by APS. Her initial labs were significantly abnormal with AKI, hypokalemia, hypomagnesemia, incompletely compensated metabolic acidosis, hypoglycemia.  Patient on admission was unable to provide much history.   Patient admitted to the critical care team, nephrology consulted due to acute renal failure.  Patient transferred to [facility] team 02/20/2020. Psych consulted  Clinical Impression  Pt admitted with above diagnosis.  Pt pleasant and cooperative at time of PT eval. participates and is able to stand with ~min assist. May need SNF until she is more independent. Will continue to follow in acute setting  Pt currently with functional limitations due to the deficits listed below (see PT Problem List). Pt will benefit from skilled PT to increase their independence and safety with mobility to allow discharge to the venue listed below.       Follow Up Recommendations SNF    Equipment Recommendations  None recommended by PT    Recommendations for Other Services       Precautions / Restrictions Precautions Precautions: Fall Precaution Comments: panda tube Restrictions Weight Bearing Restrictions: No      Mobility  Bed Mobility Overal bed mobility: Needs Assistance Bed Mobility: Supine to Sit;Sit to Supine     Supine to sit: Min assist Sit to supine: Min assist;Mod assist   General bed mobility comments: light assist with LEs off bed, cues to self assist, incr time. assist to elevate bil LEs on to bed     Transfers Overall transfer level: Needs assistance               General transfer comment: attempted with steady (as pt was unable to stand with OT).  pt stood x3, min assist to min/guard for elevated surface. able to maintain standing with light UE assist on front bar of stedy  Ambulation/Gait             General Gait Details: NT - pt did not want to "over do it" and also standing in stedy  Stairs            Wheelchair Mobility    Modified Rankin (Stroke Patients Only)       Balance Overall balance assessment: Needs assistance Sitting-balance support: Feet supported;No upper extremity supported Sitting balance-Leahy Scale: Fair     Standing balance support: Bilateral upper extremity supported Standing balance-Leahy Scale: Fair Standing balance comment: able to briefly maintain static stand without UE support                             Pertinent Vitals/Pain Pain Assessment: No/denies pain    Home Living Family/patient expects to be discharged to:: Unsure Living Arrangements: Alone Available Help at Discharge: Friend(s);Personal care attendant;Available PRN/intermittently   Home Access: Stairs to enter   Entrance Stairs-Number of Steps: did not state amount Home Layout: One level Home Equipment: Cane - single point;Walker - 2 wheels;Walker - 4 wheels Additional Comments: question reliability of info from pt    Prior Function     Gait / Transfers Assistance Needed: ambulates  with cane--?reliability     Comments: reports sleeps in recliner     Hand Dominance        Extremity/Trunk Assessment   Upper Extremity Assessment Upper Extremity Assessment: Defer to OT evaluation;Generalized weakness    Lower Extremity Assessment Lower Extremity Assessment: Generalized weakness       Communication   Communication: No difficulties  Cognition Arousal/Alertness: Awake/alert   Overall Cognitive Status: No family/caregiver present  to determine baseline cognitive functioning Area of Impairment: Attention;Following commands;Problem solving                   Current Attention Level: Sustained   Following Commands: Follows one step commands with increased time;Follows multi-step commands with increased time     Problem Solving: Slow processing;Decreased initiation;Requires verbal cues        General Comments      Exercises     Assessment/Plan    PT Assessment Patient needs continued PT services  PT Problem List Decreased strength;Decreased activity tolerance;Decreased mobility;Decreased balance;Decreased knowledge of use of DME       PT Treatment Interventions DME instruction;Gait training;Functional mobility training;Therapeutic exercise;Therapeutic activities;Patient/family education;Balance training    PT Goals (Current goals can be found in the Care Plan section)  Acute Rehab PT Goals Patient Stated Goal: get moving PT Goal Formulation: With patient Time For Goal Achievement: 03/07/20 Potential to Achieve Goals: Good    Frequency Min 2X/week   Barriers to discharge        Co-evaluation               AM-PAC PT "6 Clicks" Mobility  Outcome Measure Help needed turning from your back to your side while in a flat bed without using bedrails?: A Little Help needed moving from lying on your back to sitting on the side of a flat bed without using bedrails?: A Little Help needed moving to and from a bed to a chair (including a wheelchair)?: A Little Help needed standing up from a chair using your arms (e.g., wheelchair or bedside chair)?: A Little Help needed to walk in hospital room?: A Lot Help needed climbing 3-5 steps with a railing? : A Lot 6 Click Score: 16    End of Session   Activity Tolerance: Patient tolerated treatment well Patient left: with call bell/phone within reach;in bed;with bed alarm set;with nursing/sitter in room   PT Visit Diagnosis: Difficulty in walking, not  elsewhere classified (R26.2);Muscle weakness (generalized) (M62.81)    Time: 4128-7867 PT Time Calculation (min) (ACUTE ONLY): 18 min   Charges:   PT Evaluation $PT Eval Low Complexity: Pettis, PT  Acute Rehab Dept ([place]) [phone] Pager [phone]  02/22/2020   [facility] 02/22/2020, 3:01 PM

## 2020-02-23 LAB — GLUCOSE, CAPILLARY
Glucose-Capillary: 104 mg/dL — ABNORMAL HIGH (ref 70–99)
Glucose-Capillary: 106 mg/dL — ABNORMAL HIGH (ref 70–99)
Glucose-Capillary: 77 mg/dL (ref 70–99)
Glucose-Capillary: 79 mg/dL (ref 70–99)
Glucose-Capillary: 89 mg/dL (ref 70–99)
Glucose-Capillary: 92 mg/dL (ref 70–99)

## 2020-02-23 LAB — RENAL FUNCTION PANEL
Albumin: 2.3 g/dL — ABNORMAL LOW (ref 3.5–5.0)
Anion gap: 9 (ref 5–15)
BUN: 27 mg/dL — ABNORMAL HIGH (ref 6–20)
CO2: 24 mmol/L (ref 22–32)
Calcium: 8.4 mg/dL — ABNORMAL LOW (ref 8.9–10.3)
Chloride: 104 mmol/L (ref 98–111)
Creatinine, Ser: 1.18 mg/dL — ABNORMAL HIGH (ref 0.44–1.00)
GFR, Estimated: 53 mL/min — ABNORMAL LOW (ref >60)
Glucose, Bld: 97 mg/dL (ref 70–99)
Phosphorus: 2.8 mg/dL (ref 2.5–4.6)
Potassium: 4 mmol/L (ref 3.5–5.1)
Sodium: 137 mmol/L (ref 135–145)

## 2020-02-23 LAB — CBC WITH DIFFERENTIAL/PLATELET
Abs Immature Granulocytes: 0.23 10*3/uL — ABNORMAL HIGH (ref 0.00–0.07)
Basophils Absolute: 0 10*3/uL (ref 0.0–0.1)
Basophils Relative: 0 %
Eosinophils Absolute: 0.2 10*3/uL (ref 0.0–0.5)
Eosinophils Relative: 4 %
HCT: 24.2 % — ABNORMAL LOW (ref 36.0–46.0)
Hemoglobin: 7.7 g/dL — ABNORMAL LOW (ref 12.0–15.0)
Immature Granulocytes: 4 %
Lymphocytes Relative: 33 %
Lymphs Abs: 1.9 10*3/uL (ref 0.7–4.0)
MCH: 28.1 pg (ref 26.0–34.0)
MCHC: 31.8 g/dL (ref 30.0–36.0)
MCV: 88.3 fL (ref 80.0–100.0)
Monocytes Absolute: 0.7 10*3/uL (ref 0.1–1.0)
Monocytes Relative: 12 %
Neutro Abs: 2.7 10*3/uL (ref 1.7–7.7)
Neutrophils Relative %: 47 %
Platelets: 272 10*3/uL (ref 150–400)
RBC: 2.74 MIL/uL — ABNORMAL LOW (ref 3.87–5.11)
RDW: 14.9 % (ref 11.5–15.5)
WBC: 5.7 10*3/uL (ref 4.0–10.5)
nRBC: 0.7 % — ABNORMAL HIGH (ref 0.0–0.2)

## 2020-02-23 LAB — HEMOGLOBIN AND HEMATOCRIT, BLOOD
HCT: 32.6 % — ABNORMAL LOW (ref 36.0–46.0)
Hemoglobin: 10 g/dL — ABNORMAL LOW (ref 12.0–15.0)

## 2020-02-23 LAB — MAGNESIUM: Magnesium: 1.5 mg/dL — ABNORMAL LOW (ref 1.7–2.4)

## 2020-02-23 MED ORDER — COLCHICINE 0.6 MG PO TABS
0.6000 mg | ORAL_TABLET | Freq: Two times a day (BID) | ORAL | Status: DC
  Administered 2020-02-23 – 2020-02-26 (×7): 0.6 mg via ORAL
  Filled 2020-02-23 (×7): qty 1

## 2020-02-23 MED ORDER — HYDRALAZINE HCL 50 MG PO TABS
50.0000 mg | ORAL_TABLET | Freq: Two times a day (BID) | ORAL | Status: DC
  Administered 2020-02-23 – 2020-02-26 (×7): 50 mg via ORAL
  Filled 2020-02-23 (×7): qty 1

## 2020-02-23 MED ORDER — MAGNESIUM SULFATE 4 GM/100ML IV SOLN
4.0000 g | Freq: Once | INTRAVENOUS | Status: AC
  Administered 2020-02-23: 4 g via INTRAVENOUS
  Filled 2020-02-23: qty 100

## 2020-02-23 MED ORDER — FEBUXOSTAT 40 MG PO TABS
40.0000 mg | ORAL_TABLET | Freq: Every day | ORAL | Status: DC
  Administered 2020-02-23 – 2020-02-26 (×4): 40 mg via ORAL
  Filled 2020-02-23 (×4): qty 1

## 2020-02-23 MED ORDER — ASENAPINE MALEATE 5 MG SL SUBL
10.0000 mg | SUBLINGUAL_TABLET | Freq: Three times a day (TID) | SUBLINGUAL | Status: DC
  Administered 2020-02-23 – 2020-02-25 (×9): 10 mg via SUBLINGUAL
  Filled 2020-02-23: qty 4
  Filled 2020-02-23 (×2): qty 2
  Filled 2020-02-23 (×2): qty 4
  Filled 2020-02-23: qty 2
  Filled 2020-02-23 (×3): qty 4
  Filled 2020-02-23: qty 2
  Filled 2020-02-23 (×2): qty 4
  Filled 2020-02-23: qty 2

## 2020-02-23 NOTE — Progress Notes (Signed)
PROGRESS NOTE    Tiffany [name]  RDE:[id number] DOB: 12/07/1960 DOA: 02/18/2020 PCP: [name], MD    Chief Complaint  Patient presents with  . Psychiatric Evaluation    Brief Narrative:  Ms. Lair is a 60 y/o woman with a history of schizoaffective disorder who was brought to the hospital for abnormal behavior due to need for inpatient psychiatric treatment. Per ED records she has been not eating for several days at home and has been barricading herself inside. She has not been taking her psychiatric medications. IVC initiated by APS. Her initial labs were significantly abnormal with AKI, hypokalemia, hypomagnesemia, incompletely compensated metabolic acidosis, hypoglycemia. She was evaluated in the ED and started on aggressive IVF resuscitation, and electrolyte repletion. Patient on admission was unable to provide much history. Patient was brought in an IVC for hallucinations, paranoid behavior.  Patient admitted to the critical care team, nephrology consulted due to acute renal failure, trended to place for feeding. Patient's electrolytes repleted. Patient transferred to [facility] team 02/20/2020.    Assessment & Plan:   Principal Problem:   ARF (acute renal failure) (HCC) Active Problems:   Type 2 diabetes mellitus (HCC)   Dyslipidemia   COPD (chronic obstructive pulmonary disease) (HCC)   Tobacco use disorder   Hyponatremia   Essential hypertension   Hypokalemia   CKD (chronic kidney disease) stage 3, GFR 30-59 ml/min (HCC)   Hypomagnesemia   Gout   Drug abuse (HCC)   Schizoaffective disorder, bipolar type (HCC)   E. coli UTI   Acute metabolic encephalopathy   Psychosis (HCC)   Schizophrenia, schizo-affective ([place])   Metabolic acidosis   Hypocalcemia  1 acute renal failure on chronic kidney disease stage IIIb (baseline creatinine 1.2-1.5) Likely secondary to prerenal azotemia in the setting of poor oral intake, NSAID use. Creatinine on admission was 6.26.  Urinalysis done with large leukocytes, nitrite negative, 100 protein, 21-50 WBCs. Urine sodium noted at 17. Urine creatinine at 300.84. Urine osmolality of 300. Renal ultrasound done was negative for hydronephrosis, showed increased echogenicity of bilateral kidneys likely reflecting medical renal disease, large cystic structure superior to the bladder not significant change in comparison to prior imaging. Patient placed on IV fluids. Patient with urine output of 2 L over the past 24 hours.  Renal function improving creatinine down to 1.18 from 1.5 from 2.79 from 4.60 from 6.26 on admission.  Saline lock IV fluids.  Nephrology was following but have signed off.   2. Hypokalemia/hypomagnesemia Likely secondary to poor oral intake.  Potassium at 4.  Magnesium at 1.5.  Magnesium sulfate 4 g IV x1.  Follow.  3. Hyperphosphatemia>>>> hypophosphatemia Secondary to problem #1.  Patient status post IV phosphorus.  Phosphorus currently at 2.8.  Follow.  4. Acute metabolic acidosis/high anion gap Likely secondary to problem #1 and probable starvation ketosis. Lactate normal. UDS was positive for benzodiazepines and amphetamines. Salicylate levels normal.  Acidosis improved.  Patient initially on a bicarb drip currently on LR. Patient on tube feeds with trickle feeds. Patient with poor oral intake. Improved with hydration. Follow.  5. Hypotension Likely secondary to volume depletion.  Resolved with hydration.  Blood pressure now somewhat elevated.  6.  E. coli UTI Urine cultures with > 100,000 colonies of E. coli resistant to ampicillin and Bactrim, sensitive to the cephalosporins, Cipro, gent.. Transitioned from IV Rocephin to IV cefazolin to complete a 5-day course of treatment.    7. Hypocalcemia Corrected calcium of 9.76. Improved after IV calcium chloride  and IV fluids.  Saline lock IV fluids.  Follow.  8. Acute metabolic encephalopathy Likely secondary to problem #1 in the setting of schizoaffective  disorder. Slowly improving with current treatment of IV fluids.  IV fluids have been saline lock.  On IV antibiotics for E. coli UTI.  Psychiatry following and adjusting psychiatric medications.  Supportive care.  Follow.   9. Schizoaffective disorder Patient noted to have a history of schizoaffective disorder with psychosis on presentation noted to have barricaded herself inside the house and not eating. Patient stated she felt her son was going to hurt her and still believes he is trying to hurt her. Patient denied any visual hallucinations, denied any auditory hallucinations.  On IVC.  Psychiatry has been consulted and are following and managing/adjusting psychiatric medications.  Per psychiatry patient with no evidence of imminent risk to self or others at present, may need outpatient follow-up/management.  Appreciate psychiatry input and recommendations.  10.  Well-controlled diabetes mellitus type 2 with hypoglycemia Patient noted to be hypoglycemic on presentation likely secondary to poor oral intake.  CBG 92 this morning.  Continue tube feeds.  Sliding scale has been discontinued.  Follow.    11. Anemia/folate deficiency Likely secondary to malnutrition. Patient with no overt bleeding.  Hemoglobin currently at 9.  Anemia panel with a folate deficiency and also consistent with anemia of chronic disease.  Continue folic acid 1 mg daily.  Follow H&H.  Transfusion threshold hemoglobin < 7.  12. Leukopenia Likely secondary to malnutrition.  Improved.   13. Hyponatremia Likely secondary to hypovolemic hyponatremia.  Resolved with hydration.  Follow.   14. Malnutrition Patient with history of significant weight loss. Patient with risk of refeeding syndrome. Continue trickle tube feeds while NG tube present and current regular diet.  Patient with poor oral intake.  Dietitian following.   15. Hypertension Patient noted to be hypotensive on admission and hydrated IV fluids. Blood pressure  increasing.  Saline lock IV fluids.  Continue Norvasc 10 mg daily.  Increase hydralazine to 50 mg twice daily.  Supportive care.   DVT prophylaxis: Heparin Code Status: Full Family Communication: Updated patient. No family at bedside. Disposition:   Status is: Inpatient    Dispo: The patient is from: Home              Anticipated d/c is to: SNF.               Anticipated d/c date is: 2-3 days              Patient currently with poor oral intake, on tube feeds, acute renal failure was on IV fluids, IVCD.  Not stable for discharge.   Difficult to place patient undetermined       Consultants:   Psychiatry: Sheran Fava, [place] 02/20/2020  Nephrology: Dr. Marval Regal 02/18/2020  PCCM admission  Procedures:   Chest x-ray 02/18/2020  Abdominal films 02/18/2020  Renal ultrasound 02/18/2020  Antimicrobials:   IV Rocephin 02/20/2020>>>>> 02/22/2020  IV cefazolin 02/23/2020>>>   Subjective: Patient alert.  Sitting up on the side of the bed.  Poor oral intake per nurse tech.  No chest pain.  No shortness of breath.  No abdominal pain.  Sitter at bedside.   Objective: Vitals:   02/22/20 2107 02/22/20 2107 02/23/20 0817 02/23/20 0936  BP: (!) 158/98 (!) 159/98  (!) 144/91  Pulse:  93  87  Resp:      Temp:  98 F (36.7 C)  98.5 F (36.9  C)  TempSrc:  Oral  Oral  SpO2:  98% 96% 99%  Weight:      Height:        Intake/Output Summary (Last 24 hours) at 02/23/2020 1217 Last data filed at 02/23/2020 1000 Gross per 24 hour  Intake 2821.49 ml  Output 1375 ml  Net 1446.49 ml   Filed Weights   02/19/20 0422 02/20/20 0500 02/21/20 0500  Weight: 62.1 kg 68.4 kg 70 kg    Examination:  General exam: NG tube/panda tube in place.  Dry mucous membranes.  Respiratory system: CTA B.  No wheezes, no crackles, no rhonchi.  Normal respiratory effort.  Cardiovascular system: Regular rate rhythm no murmurs rubs or gallops.  No JVD.  No lower extremity edema. Gastrointestinal system: Abdomen  is soft, nontender, nondistended, positive bowel sounds.  No rebound.  No guarding.  Central nervous system: Alert and oriented. No focal neurological deficits. Extremities: Symmetric 5 x 5 power. Skin: No rashes, lesions or ulcers Psychiatry: Judgement and insight appear poor. Mood & affect flat.     Data Reviewed: I have personally reviewed following labs and imaging studies  CBC: Recent Labs  Lab 02/18/20 1334 02/18/20 2114 02/19/20 0251 02/20/20 0258 02/21/20 0253 02/22/20 0554 02/23/20 0456  WBC 2.7*   < > 2.8* 5.3 3.7* 4.8 5.7  NEUTROABS 1.8  --   --   --  2.1 2.4 2.7  HGB 10.2*   < > 9.1* 8.7* 8.4* 9.0* 7.7*  HCT 34.0*   < > 28.9* 26.7* 26.4* 28.7* 24.2*  MCV 92.9   < > 90.0 87.5 87.7 88.9 88.3  PLT 325   < > 299 335 297 300 272   < > = values in this interval not displayed.    Basic Metabolic Panel: Recent Labs  Lab 02/19/20 0251 02/19/20 1535 02/20/20 0258 02/21/20 0253 02/22/20 0554 02/23/20 0456  NA 133*  135 135 134* 137 140 137  K 3.1*  3.2* 3.8 3.8 3.1* 3.6 4.0  CL 103  103 105 103 107 106 104  CO2 16*  17* 19* 19* 22 23 24   GLUCOSE 135*  137* 119* 111* 106* 91 97  BUN 40*  41* 48* 59* 53* 34* 27*  CREATININE 5.56*  5.57* 5.39* 4.60* 2.79* 1.50* 1.18*  CALCIUM 6.6*  6.7* 7.2* 7.9* 8.4* 8.7* 8.4*  MG 1.8  --  2.0 1.6* 1.9 1.5*  PHOS 4.7*  4.8*  --  2.8 1.2* 2.6 2.8    GFR: Estimated Creatinine Clearance: 46.5 mL/min (A) (by C-G formula based on SCr of 1.18 mg/dL (H)).  Liver Function Tests: Recent Labs  Lab 02/18/20 1334 02/19/20 0251 02/21/20 0253 02/22/20 0554 02/23/20 0456  AST 40  --   --   --   --   ALT 31  --   --   --   --   ALKPHOS 152*  --   --   --   --   BILITOT 0.8  --   --   --   --   PROT 6.8  --   --   --   --   ALBUMIN 3.4* 2.8* 2.3* 2.4* 2.3*    CBG: Recent Labs  Lab 02/22/20 2058 02/22/20 2355 02/23/20 0437 02/23/20 0743 02/23/20 1147  GLUCAP 107* 134* 92 89 106*     Recent Results (from the past  240 hour(s))  Resp Panel by RT-PCR (Flu A&B, Covid) Nasopharyngeal Swab     Status: None   Collection  Time: 02/18/20  1:44 PM   Specimen: Nasopharyngeal Swab; Nasopharyngeal(NP) swabs in vial transport medium  Result Value Ref Range Status   SARS Coronavirus 2 by RT PCR NEGATIVE NEGATIVE Final    Comment: (NOTE) SARS-CoV-2 target nucleic acids are NOT DETECTED.  The SARS-CoV-2 RNA is generally detectable in upper respiratory specimens during the acute phase of infection. The lowest concentration of SARS-CoV-2 viral copies this assay can detect is 138 copies/mL. A negative result does not preclude SARS-Cov-2 infection and should not be used as the sole basis for treatment or other patient management decisions. A negative result may occur with  improper specimen collection/handling, submission of specimen other than nasopharyngeal swab, presence of viral mutation(s) within the areas targeted by this assay, and inadequate number of viral copies(<138 copies/mL). A negative result must be combined with clinical observations, patient history, and epidemiological information. The expected result is Negative.  Fact Sheet for Patients:  EntrepreneurPulse.com.au  Fact Sheet for Healthcare Providers:  IncredibleEmployment.be  This test is no t yet approved or cleared by the [place] FDA and  has been authorized for detection and/or diagnosis of SARS-CoV-2 by FDA under an Emergency Use Authorization (EUA). This EUA will remain  in effect (meaning this test can be used) for the duration of the COVID-19 declaration under Section 564(b)(1) of the Act, 21 U.S.C.section 360bbb-3(b)(1), unless the authorization is terminated  or revoked sooner.       Influenza A by PCR NEGATIVE NEGATIVE Final   Influenza B by PCR NEGATIVE NEGATIVE Final    Comment: (NOTE) The Xpert Xpress SARS-CoV-2/FLU/RSV plus assay is intended as an aid in the diagnosis of influenza  from Nasopharyngeal swab specimens and should not be used as a sole basis for treatment. Nasal washings and aspirates are unacceptable for Xpert Xpress SARS-CoV-2/FLU/RSV testing.  Fact Sheet for Patients: EntrepreneurPulse.com.au  Fact Sheet for Healthcare Providers: IncredibleEmployment.be  This test is not yet approved or cleared by the [place] FDA and has been authorized for detection and/or diagnosis of SARS-CoV-2 by FDA under an Emergency Use Authorization (EUA). This EUA will remain in effect (meaning this test can be used) for the duration of the COVID-19 declaration under Section 564(b)(1) of the Act, 21 U.S.C. section 360bbb-3(b)(1), unless the authorization is terminated or revoked.  Performed at [facility], [place] [address] [place], [place] [address]   MRSA PCR Screening     Status: None   Collection Time: 02/18/20  6:58 PM   Specimen: Nasopharyngeal  Result Value Ref Range Status   MRSA by PCR NEGATIVE NEGATIVE Final    Comment:        The GeneXpert MRSA Assay (FDA approved for NASAL specimens only), is one component of a comprehensive MRSA colonization surveillance program. It is not intended to diagnose MRSA infection nor to guide or monitor treatment for MRSA infections. Performed at [facility], [place] [address] [place], [place] [address]   Urine culture     Status: Abnormal   Collection Time: 02/19/20  2:00 AM   Specimen: Urine, Clean Catch  Result Value Ref Range Status   Specimen Description   Final    URINE, CLEAN CATCH Performed at [facility], [place] [address] [place], [place] [address]    Special Requests   Final    NONE Performed at [facility], [place] [address], [place], [place] [address]    Culture >=100,000 COLONIES/mL ESCHERICHIA COLI (A)  Final   Report Status  02/21/2020 FINAL  Final   Organism ID, Bacteria ESCHERICHIA  COLI (A)  Final      Susceptibility   Escherichia coli - MIC*    AMPICILLIN >=32 RESISTANT Resistant     CEFAZOLIN <=4 SENSITIVE Sensitive     CEFEPIME <=0.12 SENSITIVE Sensitive     CEFTRIAXONE <=0.25 SENSITIVE Sensitive     CIPROFLOXACIN 1 SENSITIVE Sensitive     GENTAMICIN <=1 SENSITIVE Sensitive     IMIPENEM <=0.25 SENSITIVE Sensitive     NITROFURANTOIN <=16 SENSITIVE Sensitive     TRIMETH/SULFA >=320 RESISTANT Resistant     AMPICILLIN/SULBACTAM 16 INTERMEDIATE Intermediate     PIP/TAZO <=4 SENSITIVE Sensitive     * >=100,000 COLONIES/mL ESCHERICHIA COLI         Radiology Studies: No results found.      Scheduled Meds: . amLODipine  10 mg Oral Daily  . asenapine  10 mg Sublingual TID  . Chlorhexidine Gluconate Cloth  6 each Topical Q0600  . colchicine  0.6 mg Oral BID  . febuxostat  40 mg Oral Daily  . feeding supplement  237 mL Oral BID BM  . umeclidinium bromide  1 puff Inhalation Daily   And  . fluticasone furoate-vilanterol  1 puff Inhalation Daily  . folic acid  1 mg Oral Daily  . gabapentin  300 mg Oral Daily  . heparin  5,000 Units Subcutaneous Q8H  . hydrALAZINE  50 mg Oral BID  . insulin aspart  1-3 Units Subcutaneous Q4H  . lisdexamfetamine  20 mg Oral Q breakfast  . montelukast  10 mg Oral QHS  . multivitamin with minerals  1 tablet Oral Daily  . pantoprazole  40 mg Oral Daily  . prazosin  2 mg Oral QHS  . saccharomyces boulardii  250 mg Oral BID  . thiamine  100 mg Oral Daily  . traZODone  100 mg Oral QHS   Continuous Infusions: .  ceFAZolin (ANCEF) IV 1 g (02/23/20 0550)  . feeding supplement (JEVITY 1.2 CAL) 1,000 mL (02/23/20 1046)     LOS: 5 days    Time spent: 35 minutes    Irine Seal, MD [facility]   To contact the attending provider between 7A-7P or the covering provider during after hours 7P-7A, please log into the web site [email or web address] and access using universal [place] password for that web site. If you do  not have the password, please call the hospital operator.  02/23/2020, 12:17 PM

## 2020-02-23 NOTE — Consult Note (Signed)
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 to keep in place for now in the event patient attempts to leave as she does need medical treatment and is paranoid at baseline.   Disposition: No evidence of imminent risk to self or others at present.   Will cotninue to follow, at current patient has paranoid delusions that are chronic in nature.  Condition can be safely managed in an outpatient setting. Patient may require increase in level of  care to include [employer] or ACT team.   Continue follow-up with outpatient psychiatric providers.   Discussed plan with Dr. Grandville Silos over secure chat. Psychiatry to follow up Wednesday.   Suella Broad, FNP 02/23/2020 9:36 AM   Review of Systems  Psychiatric/Behavioral: The patient is  nervous/anxious.   All other systems reviewed and are negative.

## 2020-02-23 NOTE — Progress Notes (Signed)
Patient removed Feeding tube around 1930, Patient sitting in chair alert and oriented x 4, no acute distress. We discussed placing another feeding tube, patient states " I do not want to do that."  Feeding tube was not placed at this time.  " Staff reports that she did eat her some of her last meal.

## 2020-02-23 NOTE — TOC Initial Note (Addendum)
Transition of Care [facility]) - Initial/Assessment Note    Patient Details  Name: Tiffany [name] MRN: [id number] Date of Birth: Jan 14, 1961  Transition of Care [facility]) CM/SW Contact:    Dessa Phi, RN Phone Number: 02/23/2020, 12:34 PM  Clinical Narrative: Spoke to patient about d/c plans-speaks slow, & soft-she is agreeable to SNF once medically stable.she does not want me to talk to anyone else except herself.Currently has Panda tube-unable to fax out to SNF while panda in place, also currently IVC-unable to fax out.MD updated.  12:39a-Left vm w/[facility] to f/u on concerns.   1:14p-Return call from [facility] says they received the report from the police dept who filed the IVC-APS rep has no contact person for the patient. Continue to monitor for d/c needs.               Expected Discharge Plan: Skilled Nursing Facility Barriers to Discharge: Continued Medical Work up   Patient Goals and CMS Choice Patient states their goals for this hospitalization and ongoing recovery are:: go to rehab CMS Medicare.gov Compare Post Acute Care list provided to:: Patient    Expected Discharge Plan and Services Expected Discharge Plan: [place] arrangements for the past 2 months: Apartment                                      Prior Living Arrangements/Services Living arrangements for the past 2 months: Apartment Lives with:: Self Patient language and need for interpreter reviewed:: Yes Do you feel safe going back to the place where you live?: No   aps involved and ivc orders present.  Need for Family Participation in Patient Care: Yes (Comment) Care giver support system in place?: Yes (comment)   Criminal Activity/Legal Involvement Pertinent to Current Situation/Hospitalization: No - Comment as needed  Activities of Daily Living Home Assistive Devices/Equipment: None ADL Screening (condition at time of admission) Patient's cognitive ability  adequate to safely complete daily activities?: No Is the patient deaf or have difficulty hearing?: No Does the patient have difficulty seeing, even when wearing glasses/contacts?: No Does the patient have difficulty concentrating, remembering, or making decisions?: Yes Patient able to express need for assistance with ADLs?: No Does the patient have difficulty dressing or bathing?: Yes Independently performs ADLs?: No Communication: Appropriate for developmental age Dressing (OT): Needs assistance Is this a change from baseline?: Pre-admission baseline Grooming: Needs assistance Is this a change from baseline?: Pre-admission baseline Feeding: Appropriate for developmental age Bathing: Needs assistance Is this a change from baseline?: Pre-admission baseline Toileting: Needs assistance Is this a change from baseline?: Pre-admission baseline In/Out Bed: Needs assistance Is this a change from baseline?: Pre-admission baseline Walks in Home: Appropriate for developmental age Does the patient have difficulty walking or climbing stairs?: Yes Weakness of Legs: None Weakness of Arms/Hands: None  Permission Sought/Granted Permission sought to share information with : Case Manager Permission granted to share information with : Yes, Verbal Permission Granted  Share Information with NAME: Case Manager           Emotional Assessment Appearance:: Appears stated age Attitude/Demeanor/Rapport: Crying Affect (typically observed): Depressed Orientation: : Oriented to Self,Oriented to Place,Oriented to Situation,Oriented to  Time Alcohol / Substance Use: Illicit Drugs Psych Involvement: Yes (comment)  Admission diagnosis:  Hypocalcemia [E83.51] Hypokalemia [A83.4] Metabolic acidosis [H96.2] Paranoia ([place]) [F22] AKI (acute kidney injury) ([place]) [N17.9] Acute renal  failure, unspecified acute renal failure type ([place]) [N17.9] Altered mental status, unspecified altered mental status type  [R41.82] Patient Active Problem List   Diagnosis Date Noted  . E. coli UTI 02/20/2020  . Acute metabolic encephalopathy 17/35/6701  . Psychosis ([place]) 02/20/2020  . Schizophrenia, schizo-affective ([place]) 02/20/2020  . Metabolic acidosis 41/03/129  . Altered mental status   . Hypocalcemia   . AKI (acute kidney injury) ([place]) 02/18/2020  . Pelvic mass in female 01/02/2020  . Genital herpes 11/25/2019  . Pressure injury of skin 10/28/2019  . ARF (acute renal failure) ([place]) 10/27/2019  . Family discord 02/04/2019  . Aggressive behavior   . PTSD (post-traumatic stress disorder) 05/27/2018  . Schizoaffective disorder, bipolar type ([place]) 04/05/2018  . Closed displaced fracture of fifth metacarpal bone 03/21/2018  . Difficulty with speech 01/24/2018  . Encephalopathy 11/21/2017  . Drug abuse ([place]) 11/21/2017  . Frequent falls 10/11/2017  . Binge eating disorder   . Dependence on continuous supplemental oxygen 05/14/2017  . Gout 04/11/2017  . Hypomagnesemia   . CKD (chronic kidney disease) stage 3, GFR 30-59 ml/min (HCC) 12/15/2016  . Carotid artery stenosis   . Hypokalemia   . Osteoarthritis 10/26/2016  . Anoxic brain injury ([place]) 09/08/2016  . Overactive bladder 06/07/2016  . QT prolongation   . OSA and COPD overlap syndrome ([place])   . Arthritis   . Essential hypertension 03/22/2016  . Cocaine use disorder, severe, in sustained remission ([place]) 12/17/2015  . History of drug abuse in remission ([place]) 11/28/2015  . Hyponatremia 11/25/2015  . Chronic diastolic congestive heart failure ([place])   . Chronic respiratory failure with hypoxia and hypercapnia ([place]) 06/22/2015  . Tobacco use disorder 07/22/2014  . COPD (chronic obstructive pulmonary disease) ([place]) 07/08/2014  . Seizure ([place]) 01/04/2013  . Chronic pain syndrome 06/18/2012  . Dyslipidemia 04/24/2011  . Diabetic neuropathy ([place]) 04/24/2011  . Morbid obesity ([place]) 10/18/2010  . Type 2 diabetes mellitus ([place]) 10/18/2010   PCP:   [name], MD Pharmacy:   [facility] [place], [address] [place] [address] Phone: [phone] Fax: [phone]     Social Determinants of Health (SDOH) Interventions    Readmission Risk Interventions No flowsheet data found.

## 2020-02-23 NOTE — Progress Notes (Signed)
Pt removed feeding tube around 1930. Pamala Hurry May, RN came to replace tube, but pt refused. Pt ate some of her dinner. Will continue to monitor.

## 2020-02-23 NOTE — Care Management Important Message (Signed)
Important Message  Patient Details IM Letter given to the Patient. Name: Tiffany [name] MRN: [id number] Date of Birth: 1960/07/29   Medicare Important Message Given:  Yes     Kerin Salen 02/23/2020, 2:26 PM

## 2020-02-24 ENCOUNTER — Ambulatory Visit: Payer: 59 | Attending: Nurse Practitioner | Admitting: Nurse Practitioner

## 2020-02-24 ENCOUNTER — Ambulatory Visit (HOSPITAL_COMMUNITY): Payer: Medicaid Other | Admitting: Psychiatry

## 2020-02-24 ENCOUNTER — Encounter (HOSPITAL_COMMUNITY): Payer: Self-pay | Admitting: Psychiatry

## 2020-02-24 DIAGNOSIS — F250 Schizoaffective disorder, bipolar type: Secondary | ICD-10-CM

## 2020-02-24 LAB — GLUCOSE, CAPILLARY
Glucose-Capillary: 103 mg/dL — ABNORMAL HIGH (ref 70–99)
Glucose-Capillary: 73 mg/dL (ref 70–99)
Glucose-Capillary: 79 mg/dL (ref 70–99)
Glucose-Capillary: 87 mg/dL (ref 70–99)
Glucose-Capillary: 95 mg/dL (ref 70–99)
Glucose-Capillary: 98 mg/dL (ref 70–99)

## 2020-02-24 LAB — SARS CORONAVIRUS 2 (TAT 6-24 HRS): SARS Coronavirus 2: NEGATIVE

## 2020-02-24 MED ORDER — LORAZEPAM 2 MG/ML IJ SOLN
1.0000 mg | Freq: Once | INTRAMUSCULAR | Status: AC
  Administered 2020-02-24: 1 mg via INTRAVENOUS
  Filled 2020-02-24: qty 1

## 2020-02-24 NOTE — NC FL2 (Signed)
[place] LEVEL OF CARE SCREENING TOOL     IDENTIFICATION  Patient Name: Tiffany [name] Birthdate: [birth year] Sex: female Admission Date (Current Location): 02/18/2020  [facility] and [place] Number:  Herbalist and Address:  [facility],  [place], [place]      Provider Number: [id number]  Attending Physician Name and Address:  Eugenie Filler, MD  Relative Name and Phone Number:  Josefina Rynders [phone]    Current Level of Care: Hospital Recommended Level of Care: [place] Prior Approval Number:    Date Approved/Denied:   PASRR Number:    Discharge Plan: SNF    Current Diagnoses: Patient Active Problem List   Diagnosis Date Noted  . E. coli UTI 02/20/2020  . Acute metabolic encephalopathy 81/85/6314  . Psychosis ([place]) 02/20/2020  . Schizophrenia, schizo-affective ([place]) 02/20/2020  . Metabolic acidosis 97/02/6376  . Altered mental status   . Hypocalcemia   . AKI (acute kidney injury) ([place]) 02/18/2020  . Pelvic mass in female 01/02/2020  . Genital herpes 11/25/2019  . Pressure injury of skin 10/28/2019  . ARF (acute renal failure) ([place]) 10/27/2019  . Family discord 02/04/2019  . Aggressive behavior   . PTSD (post-traumatic stress disorder) 05/27/2018  . Schizoaffective disorder, bipolar type ([place]) 04/05/2018  . Closed displaced fracture of fifth metacarpal bone 03/21/2018  . Difficulty with speech 01/24/2018  . Encephalopathy 11/21/2017  . Drug abuse ([place]) 11/21/2017  . Frequent falls 10/11/2017  . Binge eating disorder   . Dependence on continuous supplemental oxygen 05/14/2017  . Gout 04/11/2017  . Hypomagnesemia   . CKD (chronic kidney disease) stage 3, GFR 30-59 ml/min (HCC) 12/15/2016  . Carotid artery stenosis   . Hypokalemia   . Osteoarthritis 10/26/2016  . Anoxic brain injury ([place]) 09/08/2016  . Overactive bladder 06/07/2016  . QT prolongation   . OSA and COPD  overlap syndrome ([place])   . Arthritis   . Essential hypertension 03/22/2016  . Cocaine use disorder, severe, in sustained remission ([place]) 12/17/2015  . History of drug abuse in remission ([name]) 11/28/2015  . Hyponatremia 11/25/2015  . Chronic diastolic congestive heart failure ([place])   . Chronic respiratory failure with hypoxia and hypercapnia ([place]) 06/22/2015  . Tobacco use disorder 07/22/2014  . COPD (chronic obstructive pulmonary disease) ([place]) 07/08/2014  . Seizure ([place]) 01/04/2013  . Chronic pain syndrome 06/18/2012  . Dyslipidemia 04/24/2011  . Diabetic neuropathy ([place]) 04/24/2011  . Morbid obesity ([place]) 10/18/2010  . Type 2 diabetes mellitus ([place]) 10/18/2010    Orientation RESPIRATION BLADDER Height & Weight     Self,Time,Situation,Place  Normal Incontinent Weight: 69.1 kg Height:  5\' 2"  (157.5 cm)  BEHAVIORAL SYMPTOMS/MOOD NEUROLOGICAL BOWEL NUTRITION STATUS      Incontinent Diet (Soft)  AMBULATORY STATUS COMMUNICATION OF NEEDS Skin   Limited Assist Verbally Skin abrasions (L thigh-skin tears-open to air;Sacrum stg 1-foam dsg.)                       Personal Care Assistance Level of Assistance  Bathing,Feeding,Dressing Bathing Assistance: Limited assistance Feeding assistance: Limited assistance Dressing Assistance: Limited assistance     Functional Limitations Info  Sight,Hearing,Speech Sight Info: Impaired (eyeglasses) Hearing Info: Adequate Speech Info: Adequate    SPECIAL CARE FACTORS FREQUENCY  PT (By licensed PT),OT (By licensed OT)     PT Frequency: 5x week OT Frequency: 5x week  Contractures Contractures Info: Not present    Additional Factors Info  Code Status,Allergies,Psychotropic Code Status Info:  (Full code) Allergies Info:  (Hydrocodone, Hydrocodone-acetaminophen, Hydroxyzine, Latuda (Lurasidone Hcl), Lurasidone, Magnesium-containing Compounds, Prednisone, Tramadol, Codeine, Other, Sulfa Antibiotics, Tape) Psychotropic  Info:  (Valium,trazadone-See MAR)         Current Medications (02/24/2020):  This is the current hospital active medication list Current Facility-Administered Medications  Medication Dose Route Frequency Provider Last Rate Last Admin  . acetaminophen (TYLENOL) tablet 650 mg  650 mg Oral Q4H PRN Julian Hy, DO   650 mg at 02/22/20 0956  . albuterol (VENTOLIN HFA) 108 (90 Base) MCG/ACT inhaler 2 puff  2 puff Inhalation Q6H PRN Eugenie Filler, MD      . amLODipine (NORVASC) tablet 10 mg  10 mg Oral Daily Eugenie Filler, MD   10 mg at 02/24/20 1740  . Asenapine Maleate (SAPHRIS) sublingual tablet 10 mg  10 mg Sublingual TID Suella Broad, FNP   10 mg at 02/24/20 0924  . ceFAZolin (ANCEF) IVPB 1 g/50 mL premix  1 g Intravenous Q8H Eugenie Filler, MD 100 mL/hr at 02/24/20 1345 1 g at 02/24/20 1345  . Chlorhexidine Gluconate Cloth 2 % PADS 6 each  6 each Topical Q0600 Julian Hy, DO   6 each at 02/21/20 1117  . colchicine tablet 0.6 mg  0.6 mg Oral BID Eugenie Filler, MD   0.6 mg at 02/24/20 8144  . diazepam (VALIUM) tablet 10 mg  10 mg Oral TID PRN Suella Broad, FNP   10 mg at 02/24/20 0924  . diphenhydrAMINE (BENADRYL) injection 12.5 mg  12.5 mg Intravenous Q6H PRN Eugenie Filler, MD   12.5 mg at 02/21/20 1041  . docusate sodium (COLACE) capsule 100 mg  100 mg Oral BID PRN Noemi Chapel P, DO   100 mg at 02/19/20 2359  . febuxostat (ULORIC) tablet 40 mg  40 mg Oral Daily Eugenie Filler, MD   40 mg at 02/24/20 8185  . feeding supplement (ENSURE ENLIVE / ENSURE PLUS) liquid 237 mL  237 mL Oral BID BM Eugenie Filler, MD   237 mL at 02/24/20 1344  . feeding supplement (JEVITY 1.2 CAL) liquid 1,000 mL  1,000 mL Per Tube Continuous Eugenie Filler, MD 60 mL/hr at 02/23/20 1046 1,000 mL at 02/23/20 1046  . umeclidinium bromide (INCRUSE ELLIPTA) 62.5 MCG/INH 1 puff  1 puff Inhalation Daily Eugenie Filler, MD   1 puff at 02/24/20 6314   And  .  fluticasone furoate-vilanterol (BREO ELLIPTA) 100-25 MCG/INH 1 puff  1 puff Inhalation Daily Eugenie Filler, MD   1 puff at 02/24/20 [phone]  . folic acid (FOLVITE) tablet 1 mg  1 mg Oral Daily Eugenie Filler, MD   1 mg at 02/24/20 6378  . gabapentin (NEURONTIN) capsule 300 mg  300 mg Oral Daily Suella Broad, FNP   300 mg at 02/24/20 5885  . heparin injection 5,000 Units  5,000 Units Subcutaneous Q8H ClarkMickel Baas P, DO   5,000 Units at 02/24/20 1342  . hydrALAZINE (APRESOLINE) injection 10-20 mg  10-20 mg Intravenous Q4H PRN Frederik Pear, MD   10 mg at 02/24/20 0541  . hydrALAZINE (APRESOLINE) tablet 50 mg  50 mg Oral BID Eugenie Filler, MD   50 mg at 02/24/20 0277  . insulin aspart (novoLOG) injection 1-3 Units  1-3 Units Subcutaneous Q4H Noemi Chapel P, DO   1 Units  at 02/23/20 0018  . lisdexamfetamine (VYVANSE) capsule 20 mg  20 mg Oral Q breakfast Suella Broad, FNP   20 mg at 02/24/20 0931  . montelukast (SINGULAIR) tablet 10 mg  10 mg Oral QHS Eugenie Filler, MD   10 mg at 02/23/20 2123  . multivitamin with minerals tablet 1 tablet  1 tablet Oral Daily Eugenie Filler, MD   1 tablet at 02/24/20 [phone]  . pantoprazole (PROTONIX) EC tablet 40 mg  40 mg Oral Daily Eugenie Filler, MD   40 mg at 02/24/20 8337  . polyethylene glycol (MIRALAX / GLYCOLAX) packet 17 g  17 g Oral Daily PRN Noemi Chapel P, DO      . prazosin (MINIPRESS) capsule 2 mg  2 mg Oral QHS Suella Broad, FNP   2 mg at 02/23/20 2124  . saccharomyces boulardii (FLORASTOR) capsule 250 mg  250 mg Oral BID Eugenie Filler, MD   250 mg at 02/24/20 4451  . thiamine tablet 100 mg  100 mg Oral Daily Eugenie Filler, MD   100 mg at 02/24/20 4604  . traZODone (DESYREL) tablet 100 mg  100 mg Oral QHS Suella Broad, FNP   100 mg at 02/23/20 2123     Discharge Medications: Please see discharge summary for a list of discharge medications.  Relevant Imaging  Results:  Relevant Lab Results:   Additional Information SS#  [id number]  Dessa Phi, RN

## 2020-02-24 NOTE — Consult Note (Signed)
[place] Face to Face Consultation   Reason for Consult:  Can you please follow patient daily during hospitalization and adjust/order psych medications. Thanks. Referring Physician:  Dr. Irine Seal Location of Patient: [id number]  Patient Identification: Tiffany [name] MRN:  [id number] Principal Diagnosis: ARF (acute renal failure) [facility]) Diagnosis:  Principal Problem:   ARF (acute renal failure) ([place]) Active Problems:   Type 2 diabetes mellitus (HCC)   Dyslipidemia   COPD (chronic obstructive pulmonary disease) (HCC)   Tobacco use disorder   Hyponatremia   Essential hypertension   Hypokalemia   CKD (chronic kidney disease) stage 3, GFR 30-59 ml/min (HCC)   Hypomagnesemia   Gout   Drug abuse (HCC)   Schizoaffective disorder, bipolar type (HCC)   E. coli UTI   Acute metabolic encephalopathy   Psychosis (HCC)   Schizophrenia, schizo-affective ([place])   Metabolic acidosis   Hypocalcemia   Total Time spent with patient: 20 minutes  Subjective: Patient is a 60 year old female who presented to [place] emergency room under IVC with increasing paranoia and psychosis.  Per IVC "patient with a history of multiple psychiatric disorders including schizoaffective disorder, depression, drug abuse with several weeks of paranoid thoughts, convinced her family is breaking into her home, burning her on her thighs, poisoning her food.  Noncompliant with medications.  Reportedly barricaded herself in her home and not eating or drinking.  APS concern for safety" and initiated IVC.     HPI: Tiffany [name] is a 60 y/o woman with a history of schizoaffective disorder who was brought to the hospital for abnormal behavior due to need for inpatient psychiatric treatment. Per ED records she has been not eating for several days at home and has been barricading herself inside. She has not been taking her psychiatric medications. IVC initiated by APS. Her initial labs were significantly abnormal with AKI,  hypokalemia, hypomagnesemia, incompletely compensated metabolic acidosis, hypoglycemia. She was evaluated in the ED and started on aggressive IVF resuscitation, and electrolyte repletion. [name] crying but unable to provide much history.  [name] currently receiving outpatient services with Dr. Montel Culver, last office visit via telehealth on 02/17/2020. [name] also receiving CBT from [facility].  Her current medications include Saphris 10 mg p.o. 3 times daily, gabapentin 600 mg p.o. twice daily, Vyvanse 70 mg p.o. daily, prazosin 5 mg p.o. twice daily, trazodone 50 mg p.o. every morning and 100 mg p.o. nightly   Psychiatric Consultation today 2/5:  Patient is a 60 year old African-American female who presented to [place] emergency room under IVC with increasing paranoia and psychosis.    Patient is seen resting peacefully with sitter at bedside. Spoke with nursing staff who reports patient has been calm cooperative and pleasant this morning. She reports patient ate about 90% of her breakfast and took oral medications without difficulty. She reports patient did become combative with the sitter last night, and required much redirection and assistance from security to get her back in bed. She received 1 mg of ativan, in which she appears to be responsive to at this time. Will continue to montior.   Past Psychiatric History: Schizoaffective disorder, bipolar type, anxiety.  Patient has previously tried Risperdal, Abilify, lurasidone, brexpiprazole.  Risk to Self:  Denies Risk to Others:  Denies Prior Inpatient Therapy:  Last psychiatric admission was July 2021 at [facility] Prior Outpatient Therapy:  [name] for medication management and therapeutic services.  Past Medical History:  Past Medical History:  Diagnosis Date  .  Agitation 11/22/2017  . Anoxic brain injury ([place]) 09/08/2016   C. Arrest due to respiratory failure and COPD exacerbation  . Anxiety    . Arthritis    "all over" (04/10/2016)  . Asthma 10/18/2010  . Binge eating disorder   . Cardiac arrest ([place]) 09/08/2016   PEA  . Carotid artery stenosis    1-39% bilateral by dopplers 11/2016  . Chronic diastolic (congestive) heart failure ([place])   . Chronic kidney disease    "I see a kidney dr." (04/10/2016)  . Chronic pain syndrome 06/18/2012  . Chronic post-traumatic stress disorder (PTSD) 05/27/2018  . Chronic respiratory failure with hypoxia and hypercapnia (HCC) 06/22/2015   TRILOGY Vent >AVAPA-ES., Vt target 200-400, Max P 30 , PS max 20 , PS min 6-10 , E Max 6, E Min 4, Rate Auto AVAPS Rate 2 (titrate for pt comfort) , bleed O2 at 5l/m continuous flow .   [name] CKD (chronic kidney disease) stage 3, GFR 30-59 ml/min (HCC) 12/15/2016  . Closed displaced fracture of fifth metacarpal bone 03/21/2018  . Cocaine use disorder, severe, in sustained remission ([place]) 12/17/2015  . Complication of anesthesia    decreased bp, decreased heart rate  . COPD (chronic obstructive pulmonary disease) ([place]) 07/08/2014  . Depression   . Diabetic neuropathy ([place]) 04/24/2011  . Difficulty with speech 01/24/2018  . Disorder of nervous system   . Drug abuse ([place]) 11/21/2017  . Dyslipidemia 04/24/2011  . Elevated troponin 04/28/2012  . Emphysema   . Encephalopathy 11/21/2017  . Essential hypertension 03/22/2016  . Fibula fracture 07/10/2016  . Frequent falls 10/11/2017  . GERD (gastroesophageal reflux disease)   . Gout 04/11/2017  . Heart attack ([place]) 1980s  . History of blood transfusion 1994   "couldn't stop bleeding from my period"  . History of drug abuse in remission ([place]) 11/28/2015   Quit in 2017  . Hyperlipidemia LDL goal <70   . Incontinence   . Manic depression ([place])   . Morbid obesity ([place]) 10/18/2010  . Obstructive sleep apnea 10/18/2010  . On home oxygen therapy    "6L; 24/7" (04/10/2016)  . OSA on CPAP    "wear mask sometimes" (04/10/2016)  . Paranoid ([place])    "sometimes; I'm on RX for it" (04/10/2016)   . Prolonged Q-T interval on ECG   . Rectal bleeding 12/31/2015  . Right carotid bruit 11/09/2016  . Schizoaffective disorder, bipolar type ([place]) 04/05/2018  . Seasonal allergies   . Seborrheic keratoses 12/31/2013  . Seizures ([place])    "don't know what kind; last one was ~ 1 yr ago" (04/10/2016)  . Stroke [facility]) 1980s   denies residual on 04/10/2016  . Thrush 09/19/2013  . Type 2 diabetes mellitus ([name]) 10/18/2010    Past Surgical History:  Procedure Laterality Date  . CESAREAN SECTION  1997  . HERNIA REPAIR    . IR CHOLANGIOGRAM EXISTING TUBE  07/20/2016  . IR PERC CHOLECYSTOSTOMY  05/10/2016  . IR RADIOLOGIST EVAL & MGMT  06/08/2016  . IR RADIOLOGIST EVAL & MGMT  06/29/2016  . IR SINUS/FIST TUBE CHK-NON GI  07/12/2016  . RIGHT/LEFT HEART CATH AND CORONARY ANGIOGRAPHY N/A 06/19/2017   Procedure: RIGHT/LEFT HEART CATH AND CORONARY ANGIOGRAPHY;  Surgeon: Jolaine Artist, MD;  Location: [facility];  Service: Cardiovascular;  Laterality: N/A;  . TIBIA IM NAIL INSERTION Right 07/12/2016   Procedure: INTRAMEDULLARY (IM) NAIL RIGHT TIBIA;  Surgeon: Leandrew Koyanagi, MD;  Location: [facility];  Service: Orthopedics;  Laterality: Right;  . UMBILICAL HERNIA REPAIR  ~ 1963   "that's why I don't have a belly button"  . VAGINAL HYSTERECTOMY     Family History:  Family History  Problem Relation Age of Onset  . Cancer Father        prostate  . Cancer Mother        lung  . Depression Mother   . Depression Sister   . Anxiety disorder Sister   . Schizophrenia Sister   . Bipolar disorder Sister   . Depression Sister   . Depression Brother   . Heart failure Other        cousin   Family Psychiatric  History: See above.  Social History:  Social History   Substance and Sexual Activity  Alcohol Use No  . Alcohol/week: 0.0 standard drinks     Social History   Substance and Sexual Activity  Drug Use No  . Types: Cocaine   Comment: 04/10/2016 "last used cocaine back in November 2017"     Social History   Socioeconomic History  . Marital status: Widowed    Spouse name: Not on file  . Number of children: 3  . Years of education: Not on file  . Highest education level: Not on file  Occupational History  . Occupation: disabled    Comment: factory production  Tobacco Use  . Smoking status: Former Smoker    Packs/day: 1.50    Years: 38.00    Pack years: 57.00    Types: Cigarettes    Start date: 03/13/1977    Quit date: 04/10/2016    Years since quitting: 3.8  . Smokeless tobacco: Never Used  Vaping Use  . Vaping Use: Never used  Substance and Sexual Activity  . Alcohol use: No    Alcohol/week: 0.0 standard drinks  . Drug use: No    Types: Cocaine    Comment: 04/10/2016 "last used cocaine back in November 2017"  . Sexual activity: Not Currently    Birth control/protection: Surgical  Other Topics Concern  . Not on file  Social History Narrative   Has 1 son, [name]   Lives with son and his boyfriend   Her house has ramps and handrails should she ever needs them.    Her mother lives down the street from her and is a good support person in addition to her son.   She drives herself, has private transportation.    Cocaine free since 02/24/16, smoke free since 04/10/16   Social Determinants of Health   Financial Resource Strain: Not on file  Food Insecurity: Not on file  Transportation Needs: Not on file  Physical Activity: Not on file  Stress: Not on file  Social Connections: Not on file   Additional Social History:    Allergies:   Allergies  Allergen Reactions  . Hydrocodone Shortness Of Breath  . Hydrocodone-Acetaminophen Shortness Of Breath  . Hydroxyzine Anaphylaxis and Shortness Of Breath  . Latuda [Lurasidone Hcl] Anaphylaxis  . Lurasidone Anaphylaxis  . Magnesium-Containing Compounds Anaphylaxis  . Prednisone Anaphylaxis, Swelling and Other (See Comments)    Tongue swelling  . Tramadol Anaphylaxis and Swelling  . Codeine Nausea And Vomiting  .  Other Rash  . Sulfa Antibiotics Itching  . Tape Rash    Labs:  Results for orders placed or performed during the hospital encounter of 02/18/20 (from the past 48 hour(s))  Glucose, capillary     Status: None   Collection Time: 02/22/20  5:24 PM  Result Value Ref Range   Glucose-Capillary 97 70 - 99 mg/dL    Comment: Glucose reference range applies only to samples taken after fasting for at least 8 hours.  Glucose, capillary     Status: Abnormal   Collection Time: 02/22/20  8:58 PM  Result Value Ref Range   Glucose-Capillary 107 (H) 70 - 99 mg/dL    Comment: Glucose reference range applies only to samples taken after fasting for at least 8 hours.  Glucose, capillary     Status: Abnormal   Collection Time: 02/22/20 11:55 PM  Result Value Ref Range   Glucose-Capillary 134 (H) 70 - 99 mg/dL    Comment: Glucose reference range applies only to samples taken after fasting for at least 8 hours.  Glucose, capillary     Status: None   Collection Time: 02/23/20  4:37 AM  Result Value Ref Range   Glucose-Capillary 92 70 - 99 mg/dL    Comment: Glucose reference range applies only to samples taken after fasting for at least 8 hours.  Renal function panel     Status: Abnormal   Collection Time: 02/23/20  4:56 AM  Result Value Ref Range   Sodium 137 135 - 145 mmol/L   Potassium 4.0 3.5 - 5.1 mmol/L   Chloride 104 98 - 111 mmol/L   CO2 24 22 - 32 mmol/L   Glucose, Bld 97 70 - 99 mg/dL    Comment: Glucose reference range applies only to samples taken after fasting for at least 8 hours.   BUN 27 (H) 6 - 20 mg/dL   Creatinine, Ser 1.18 (H) 0.44 - 1.00 mg/dL   Calcium 8.4 (L) 8.9 - 10.3 mg/dL   Phosphorus 2.8 2.5 - 4.6 mg/dL   Albumin 2.3 (L) 3.5 - 5.0 g/dL   GFR, Estimated 53 (L) >60 mL/min    Comment: (NOTE) Calculated using the CKD-EPI Creatinine Equation (2021)    Anion gap 9 5 - 15    Comment: Performed at [facility], [place] [address], [place], [place] [address]  CBC  with Differential/Platelet     Status: Abnormal   Collection Time: 02/23/20  4:56 AM  Result Value Ref Range   WBC 5.7 4.0 - 10.5 K/uL   RBC 2.74 (L) 3.87 - 5.11 MIL/uL   Hemoglobin 7.7 (L) 12.0 - 15.0 g/dL   HCT 24.2 (L) 36.0 - 46.0 %   MCV 88.3 80.0 - 100.0 fL   MCH 28.1 26.0 - 34.0 pg   MCHC 31.8 30.0 - 36.0 g/dL   RDW 14.9 11.5 - 15.5 %   Platelets 272 150 - 400 K/uL   nRBC 0.7 (H) 0.0 - 0.2 %   Neutrophils Relative % 47 %   Neutro Abs 2.7 1.7 - 7.7 K/uL   Lymphocytes Relative 33 %   Lymphs Abs 1.9 0.7 - 4.0 K/uL   Monocytes Relative 12 %   Monocytes Absolute 0.7 0.1 - 1.0 K/uL   Eosinophils Relative 4 %   Eosinophils Absolute 0.2 0.0 - 0.5 K/uL   Basophils Relative 0 %   Basophils Absolute 0.0 0.0 - 0.1 K/uL   Immature Granulocytes 4 %   Abs Immature Granulocytes 0.23 (H) 0.00 - 0.07 K/uL    Comment: Performed at [facility], [place] [address] [place], [place] [address]  Magnesium     Status: Abnormal   Collection Time: 02/23/20  4:56 AM  Result Value Ref Range   Magnesium 1.5 (L) 1.7 - 2.4  mg/dL    Comment: Performed at [facility], [place] [address], [place], [place] [address]  Glucose, capillary     Status: None   Collection Time: 02/23/20  7:43 AM  Result Value Ref Range   Glucose-Capillary 89 70 - 99 mg/dL    Comment: Glucose reference range applies only to samples taken after fasting for at least 8 hours.  Glucose, capillary     Status: Abnormal   Collection Time: 02/23/20 11:47 AM  Result Value Ref Range   Glucose-Capillary 106 (H) 70 - 99 mg/dL    Comment: Glucose reference range applies only to samples taken after fasting for at least 8 hours.   Comment 1 Notify RN    Comment 2 Document in Chart   Glucose, capillary     Status: None   Collection Time: 02/23/20  4:19 PM  Result Value Ref Range   Glucose-Capillary 79 70 - 99 mg/dL    Comment: Glucose reference range applies only to samples taken after fasting for at least 8  hours.   Comment 1 Notify RN    Comment 2 Document in Chart   Hemoglobin and hematocrit, blood     Status: Abnormal   Collection Time: 02/23/20  6:06 PM  Result Value Ref Range   Hemoglobin 10.0 (L) 12.0 - 15.0 g/dL    Comment: DELTA CHECK NOTED REPEATED TO VERIFY RESULTS VERIFIED VIA RECOLLECT    HCT 32.6 (L) 36.0 - 46.0 %    Comment: Performed at [facility], [place] [address] [place], [place] [address]  Glucose, capillary     Status: Abnormal   Collection Time: 02/23/20  7:50 PM  Result Value Ref Range   Glucose-Capillary 104 (H) 70 - 99 mg/dL    Comment: Glucose reference range applies only to samples taken after fasting for at least 8 hours.  Glucose, capillary     Status: None   Collection Time: 02/23/20 11:30 PM  Result Value Ref Range   Glucose-Capillary 77 70 - 99 mg/dL    Comment: Glucose reference range applies only to samples taken after fasting for at least 8 hours.  Glucose, capillary     Status: None   Collection Time: 02/24/20  3:39 AM  Result Value Ref Range   Glucose-Capillary 79 70 - 99 mg/dL    Comment: Glucose reference range applies only to samples taken after fasting for at least 8 hours.  Glucose, capillary     Status: None   Collection Time: 02/24/20  7:23 AM  Result Value Ref Range   Glucose-Capillary 73 70 - 99 mg/dL    Comment: Glucose reference range applies only to samples taken after fasting for at least 8 hours.   *Note: Due to a large number of results and/or encounters for the requested time period, some results have not been displayed. A complete set of results can be found in Results Review.    Medications:  Current Facility-Administered Medications  Medication Dose Route Frequency Provider Last Rate Last Admin  . acetaminophen (TYLENOL) tablet 650 mg  650 mg Oral Q4H PRN Julian Hy, DO   650 mg at 02/22/20 0956  . albuterol (VENTOLIN HFA) 108 (90 Base) MCG/ACT inhaler 2 puff  2 puff Inhalation Q6H PRN Eugenie Filler, MD      . amLODipine (NORVASC) tablet 10 mg  10 mg Oral Daily Eugenie Filler, MD   10 mg at 02/24/20 0076  . Asenapine Maleate (SAPHRIS) sublingual tablet 10  mg  10 mg Sublingual TID Suella Broad, FNP   10 mg at 02/24/20 8299  . ceFAZolin (ANCEF) IVPB 1 g/50 mL premix  1 g Intravenous Q8H Eugenie Filler, MD 100 mL/hr at 02/24/20 0522 1 g at 02/24/20 0522  . Chlorhexidine Gluconate Cloth 2 % PADS 6 each  6 each Topical Q0600 Julian Hy, DO   6 each at 02/21/20 1117  . colchicine tablet 0.6 mg  0.6 mg Oral BID Eugenie Filler, MD   0.6 mg at 02/24/20 3716  . diazepam (VALIUM) tablet 10 mg  10 mg Oral TID PRN Suella Broad, FNP   10 mg at 02/24/20 0924  . diphenhydrAMINE (BENADRYL) injection 12.5 mg  12.5 mg Intravenous Q6H PRN Eugenie Filler, MD   12.5 mg at 02/21/20 1041  . docusate sodium (COLACE) capsule 100 mg  100 mg Oral BID PRN Noemi Chapel P, DO   100 mg at 02/19/20 2359  . febuxostat (ULORIC) tablet 40 mg  40 mg Oral Daily Eugenie Filler, MD   40 mg at 02/24/20 9678  . feeding supplement (ENSURE ENLIVE / ENSURE PLUS) liquid 237 mL  237 mL Oral BID BM Eugenie Filler, MD   237 mL at 02/24/20 0928  . feeding supplement (JEVITY 1.2 CAL) liquid 1,000 mL  1,000 mL Per Tube Continuous Eugenie Filler, MD 60 mL/hr at 02/23/20 1046 1,000 mL at 02/23/20 1046  . umeclidinium bromide (INCRUSE ELLIPTA) 62.5 MCG/INH 1 puff  1 puff Inhalation Daily Eugenie Filler, MD   1 puff at 02/24/20 9381   And  . fluticasone furoate-vilanterol (BREO ELLIPTA) 100-25 MCG/INH 1 puff  1 puff Inhalation Daily Eugenie Filler, MD   1 puff at 02/24/20 [phone]  . folic acid (FOLVITE) tablet 1 mg  1 mg Oral Daily Eugenie Filler, MD   1 mg at 02/24/20 1025  . gabapentin (NEURONTIN) capsule 300 mg  300 mg Oral Daily Suella Broad, FNP   300 mg at 02/24/20 8527  . heparin injection 5,000 Units  5,000 Units Subcutaneous Q8H Julian Hy, DO   5,000 Units at  02/24/20 0507  . hydrALAZINE (APRESOLINE) injection 10-20 mg  10-20 mg Intravenous Q4H PRN Frederik Pear, MD   10 mg at 02/24/20 0541  . hydrALAZINE (APRESOLINE) tablet 50 mg  50 mg Oral BID Eugenie Filler, MD   50 mg at 02/24/20 7824  . insulin aspart (novoLOG) injection 1-3 Units  1-3 Units Subcutaneous Q4H Julian Hy, DO   1 Units at 02/23/20 0018  . lisdexamfetamine (VYVANSE) capsule 20 mg  20 mg Oral Q breakfast Suella Broad, FNP   20 mg at 02/24/20 0931  . montelukast (SINGULAIR) tablet 10 mg  10 mg Oral QHS Eugenie Filler, MD   10 mg at 02/23/20 2123  . multivitamin with minerals tablet 1 tablet  1 tablet Oral Daily Eugenie Filler, MD   1 tablet at 02/24/20 [phone]  . pantoprazole (PROTONIX) EC tablet 40 mg  40 mg Oral Daily Eugenie Filler, MD   40 mg at 02/24/20 6144  . polyethylene glycol (MIRALAX / GLYCOLAX) packet 17 g  17 g Oral Daily PRN Noemi Chapel P, DO      . prazosin (MINIPRESS) capsule 2 mg  2 mg Oral QHS Suella Broad, FNP   2 mg at 02/23/20 2124  . saccharomyces boulardii (FLORASTOR) capsule 250 mg  250 mg Oral BID Grandville Silos,  Malachy Moan, MD   250 mg at 02/24/20 1856  . thiamine tablet 100 mg  100 mg Oral Daily Eugenie Filler, MD   100 mg at 02/24/20 3149  . traZODone (DESYREL) tablet 100 mg  100 mg Oral QHS Suella Broad, FNP   100 mg at 02/23/20 2123    Musculoskeletal:   Psychiatric Specialty Exam: Physical Exam Nursing note reviewed.  Neurological:     Mental Status: [name] alert and oriented to person, place, and time.     Review of Systems  Psychiatric/Behavioral: The patient is nervous/anxious.   All other systems reviewed and are negative.   Blood pressure (!) 159/87, pulse 80, temperature 97.8 F (36.6 C), temperature source Oral, resp. rate 18, height 5\' 2"  (1.575 m), weight 69.1 kg, SpO2 98 %.Body mass index is 27.86 kg/m.  General Appearance: Fairly Groomed  Eye Contact:  NA  Speech:  NA  Volume:   Normal  Mood:  NA  Affect:  NA  Thought Process:  Coherent, Linear and NA  Orientation:  NA  Thought Content:  NA  Suicidal Thoughts:  UTA  Homicidal Thoughts:  UTA  Memory:  NA  Judgement:  NA  Insight:  NA and Shallow  Psychomotor Activity:  NA  Concentration: UTA  Recall:  NA  Fund of Knowledge:  NA  Language:  NA  Akathisia:  NA  Handed:  Right  AIMS (if indicated):     Assets:  Housing Resilience  ADL's:  Intact  Cognition:  WNL  Sleep:      Patient with paranoid schizophrenia that appears to be stable, and compliant with her outpatient psychiatric medications. At baseline she has chronic delusions of paranoia, in which has been difficult to achieve complete cessation.She continues to show improvement in her level of mentation and orientation as evident by pulling out her Panda tube and attempting to eat food. Per nursing she has eaten about 90% of her food and drinking Ensure as meal replacement.  She has been compliant with her medications, and all outpatient visit follow up.She became combative last night received 1 mg of Ativan, and continues to be resting at the time of the evaluation. She denies suicidal ideation, homicidal ideation and or auditory visual hallucinations. Patient appears to be psychiatrically stable at her baseline, will continue to monitor every day or every other day at request of her attending.   Treatment Plan Summary: Daily contact with patient to assess and evaluate symptoms and progress in treatment, Medication management and Plan Will resume home medications at this time, with some appropriate dose adjustments. See below.  Patient with improved serial EKGs, QTc average 440s which is back at her baseline. Will resume home medications at this time to include Saphris 10mg  po TID.  - Will continue Gabapentin (reduced dose) , Valium, Prazosin (reduced dose), Trazodone (reduced dose) and Vyvanse (reduced dose).  -UDS positive for Amphetamines and  Benzodiazepines(she is prescribed both).  -Patient appears to be psychiatrically stable at her baseline, will reassess on Wednesday. Patient is under IVC at this time, does not appear to be a risk to self or others but recommend to keep in place for now in the event patient attempts to leave as she does need medical treatment and is paranoid at baseline. IVC expires tomorrow.   Disposition: No evidence of imminent risk to self or others at present.   Will cotninue to follow, at current patient has paranoid delusions that are chronic in nature.  Condition can  be safely managed in an outpatient setting. Patient may require increase in level of care to include [employer] or ACT team.   Continue follow-up with outpatient psychiatric providers.   Discussed plan with Dr. Grandville Silos over secure chat. Psychiatry to follow up Wednesday.   Suella Broad, FNP 02/24/2020 10:16 AM   Review of Systems  Psychiatric/Behavioral: The patient is nervous/anxious.   All other systems reviewed and are negative.
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
## 2020-02-24 NOTE — Progress Notes (Signed)
Counselor contacted Client for our telephone session, however, the Client did not answer cell phone. Counselor left vm and was to contact back. Counselor reviewed Client file and discovered that she is currently hospitalized being treated for acute physical health issues. Counselor will contact again at next appointment time.  Lise Auer, LCSW

## 2020-02-24 NOTE — Care Management (Signed)
Transition of Care (TOC) -30 day Note       Patient Details   Name: Tiffany [name]  AXE:[id number]  Date of Birth:[birth year]     Transition of Care [facility]) CM/SW Contact   Name:Marisa Hufstetler [place]   Phone Number:[phone]  Date:02/24/2020  Time:2:19p     MUST PJ:[id number]     To Whom it May Concern:     Please be advised that the above patient will require a short-term nursing home stay, anticipated 30 days or less rehabilitation and strengthening. The plan is for return home.
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
## 2020-02-24 NOTE — TOC Progression Note (Addendum)
Transition of Care [facility]) - Progression Note    Patient Details  Name: DIANA DAVENPORT MRN: [id number] Date of Birth: [birth year]  Transition of Care [facility]) CM/SW Contact  Mackson Botz, Juliann Pulse, RN Phone Number: 02/24/2020, 2:23 PM  Clinical Narrative: Spoke to patient in rm about going to rehab-she is in agreement,she smiled, & put her hands up for getting stronger.She did not provide another contact person to call when asked-she said no,just talk to me.Faxed out-initiated pasrr,await bed offers.  4p-awaiting level 2 pasrr screening.No bed offers.will be difficult to place listed.   Expected Discharge Plan: Skilled Nursing Facility Barriers to Discharge: Continued Medical Work up  Expected Discharge Plan and Services Expected Discharge Plan: [place] arrangements for the past 2 months: Apartment                                       Social Determinants of Health (SDOH) Interventions    Readmission Risk Interventions Readmission Risk Prevention Plan 02/23/2020  Transportation Screening Complete  Medication Review [employer]) Complete  PCP or Specialist appointment within 3-5 days of discharge Complete  HRI or Home Care Consult Complete  SW Recovery Care/Counseling Consult Complete  Palliative Care Screening Complete  Skilled Nursing Facility Complete  Some recent data might be hidden

## 2020-02-25 LAB — CBC WITH DIFFERENTIAL/PLATELET
Abs Immature Granulocytes: 0.32 10*3/uL — ABNORMAL HIGH (ref 0.00–0.07)
Basophils Absolute: 0 10*3/uL (ref 0.0–0.1)
Basophils Relative: 0 %
Eosinophils Absolute: 0.3 10*3/uL (ref 0.0–0.5)
Eosinophils Relative: 3 %
HCT: 26.5 % — ABNORMAL LOW (ref 36.0–46.0)
Hemoglobin: 8.4 g/dL — ABNORMAL LOW (ref 12.0–15.0)
Immature Granulocytes: 4 %
Lymphocytes Relative: 36 %
Lymphs Abs: 3.1 10*3/uL (ref 0.7–4.0)
MCH: 28.7 pg (ref 26.0–34.0)
MCHC: 31.7 g/dL (ref 30.0–36.0)
MCV: 90.4 fL (ref 80.0–100.0)
Monocytes Absolute: 0.8 10*3/uL (ref 0.1–1.0)
Monocytes Relative: 10 %
Neutro Abs: 4 10*3/uL (ref 1.7–7.7)
Neutrophils Relative %: 47 %
Platelets: 310 10*3/uL (ref 150–400)
RBC: 2.93 MIL/uL — ABNORMAL LOW (ref 3.87–5.11)
RDW: 15.9 % — ABNORMAL HIGH (ref 11.5–15.5)
WBC: 8.6 10*3/uL (ref 4.0–10.5)
nRBC: 0 % (ref 0.0–0.2)

## 2020-02-25 LAB — RENAL FUNCTION PANEL
Albumin: 2.6 g/dL — ABNORMAL LOW (ref 3.5–5.0)
Anion gap: 9 (ref 5–15)
BUN: 18 mg/dL (ref 6–20)
CO2: 28 mmol/L (ref 22–32)
Calcium: 8.5 mg/dL — ABNORMAL LOW (ref 8.9–10.3)
Chloride: 104 mmol/L (ref 98–111)
Creatinine, Ser: 1.15 mg/dL — ABNORMAL HIGH (ref 0.44–1.00)
GFR, Estimated: 55 mL/min — ABNORMAL LOW (ref >60)
Glucose, Bld: 86 mg/dL (ref 70–99)
Phosphorus: 3.6 mg/dL (ref 2.5–4.6)
Potassium: 4.4 mmol/L (ref 3.5–5.1)
Sodium: 141 mmol/L (ref 135–145)

## 2020-02-25 LAB — GLUCOSE, CAPILLARY
Glucose-Capillary: 82 mg/dL (ref 70–99)
Glucose-Capillary: 89 mg/dL (ref 70–99)
Glucose-Capillary: 95 mg/dL (ref 70–99)

## 2020-02-25 LAB — MAGNESIUM: Magnesium: 1.6 mg/dL — ABNORMAL LOW (ref 1.7–2.4)

## 2020-02-25 MED ORDER — MAGNESIUM SULFATE 2 GM/50ML IV SOLN
2.0000 g | Freq: Once | INTRAVENOUS | Status: AC
  Administered 2020-02-25: 2 g via INTRAVENOUS
  Filled 2020-02-25: qty 50

## 2020-02-25 NOTE — Consult Note (Signed)
[facility] Face to Face Consultation   Reason for Consult:  Can you please follow patient daily during hospitalization and adjust/order psych medications. Thanks. Referring Physician:  Dr. Irine Seal Location of Patient: 12[facility]  Patient Identification: Tiffany [name] MRN:  [id number] Principal Diagnosis: ARF (acute renal failure) [facility]) Diagnosis:  Principal Problem:   ARF (acute renal failure) ([place]) Active Problems:   Type 2 diabetes mellitus (HCC)   Dyslipidemia   COPD (chronic obstructive pulmonary disease) (HCC)   Tobacco use disorder   Hyponatremia   Essential hypertension   Hypokalemia   CKD (chronic kidney disease) stage 3, GFR 30-59 ml/min (HCC)   Hypomagnesemia   Gout   Drug abuse (HCC)   Schizoaffective disorder, bipolar type (HCC)   E. coli UTI   Acute metabolic encephalopathy   Psychosis (HCC)   Schizophrenia, schizo-affective ([place])   Metabolic acidosis   Hypocalcemia   Total Time spent with patient: 20 minutes  Subjective: Patient is a 60 year old female who presented to [facility] emergency room under IVC with increasing paranoia and psychosis.  Per IVC "patient with a history of multiple psychiatric disorders including schizoaffective disorder, depression, drug abuse with several weeks of paranoid thoughts, convinced her family is breaking into her home, burning her on her thighs, poisoning her food.  Noncompliant with medications.  Reportedly barricaded herself in her home and not eating or drinking.  APS concern for safety" and initiated IVC.     HPI: Tiffany [name] is a 60 y/o woman with a history of schizoaffective disorder who was brought to the hospital for abnormal behavior due to need for inpatient psychiatric treatment. Per ED records she has been not eating for several days at home and has been barricading herself inside. She has not been taking her psychiatric medications. IVC initiated by APS. Her initial labs were significantly abnormal with AKI,  hypokalemia, hypomagnesemia, incompletely compensated metabolic acidosis, hypoglycemia. She was evaluated in the ED and started on aggressive IVF resuscitation, and electrolyte repletion. [name] crying but unable to provide much history.  [name] currently receiving outpatient services with Dr. Montel Culver, last office visit via telehealth on 02/17/2020. [name] also receiving C[facility] from [facility].  Her current medications include Saphris 10 mg p.o. 3 times daily, gabapentin 600 mg p.o. twice daily, Vyvanse 70 mg p.o. daily, prazosin 5 mg p.o. twice daily, trazodone 50 mg p.o. every morning and 100 mg p.o. nightly   Psychiatric Consultation today 2/9:  Patient is a 60 year old African-American female who presented to [facility] emergency room under IVC with increasing paranoia and psychosis.    Patient is seen and assessed by this nurse practitioner. [name] observed to be smiling upon entry into the room, and is receiving a bath by her tech. She remains appropriate throughout the evaluation. She has some improved judgement and insight, as it relates to her decision making. [name] able to verbalize and express rationalization about going to a skilled nursing facility, although she doesn't think she needs it. She agrees to leave it up to physical therapy to make the decision. She appears enthusiastic about eating all her breakfast, and plans to do the same for lunch.  resting peacefully with sitter at bedside. Spoke with nursing staff who reports patient has been calm cooperative and pleasant this morning. She declines any distributive behaviors, combativeness, paranoia, or agitation in over 24 hours. She denies any suicidal ideations, homicidal ideations and or hallucinations. Will continue to montior.   Past Psychiatric History: Schizoaffective disorder,  bipolar type, anxiety.  Patient has previously tried Risperdal, Abilify, lurasidone, brexpiprazole.  Risk to Self:  Denies Risk to Others:   Denies Prior Inpatient Therapy:  Last psychiatric admission was July 2021 at [facility] Prior Outpatient Therapy:  [name] for medication management and therapeutic services.  Past Medical History:  Past Medical History:  Diagnosis Date  . Agitation 11/22/2017  . Anoxic brain injury ([place]) 09/08/2016   C. Arrest due to respiratory failure and COPD exacerbation  . Anxiety   . Arthritis    "all over" (04/10/2016)  . Asthma 10/18/2010  . Binge eating disorder   . Cardiac arrest ([place]) 09/08/2016   PEA  . Carotid artery stenosis    1-39% bilateral by dopplers 11/2016  . Chronic diastolic (congestive) heart failure ([place])   . Chronic kidney disease    "I see a kidney dr." (04/10/2016)  . Chronic pain syndrome 06/18/2012  . Chronic post-traumatic stress disorder (PTSD) 05/27/2018  . Chronic respiratory failure with hypoxia and hypercapnia (HCC) 06/22/2015   TRILOGY Vent >AVAPA-ES., Vt target 200-400, Max P 30 , PS max 20 , PS min 6-10 , E Max 6, E Min 4, Rate Auto AVAPS Rate 2 (titrate for pt comfort) , bleed O2 at 5l/m continuous flow .   [name] CKD (chronic kidney disease) stage 3, GFR 30-59 ml/min (HCC) 12/15/2016  . Closed displaced fracture of fifth metacarpal bone 03/21/2018  . Cocaine use disorder, severe, in sustained remission ([place]) 12/17/2015  . Complication of anesthesia    decreased bp, decreased heart rate  . COPD (chronic obstructive pulmonary disease) ([place]) 07/08/2014  . Depression   . Diabetic neuropathy ([place]) 04/24/2011  . Difficulty with speech 01/24/2018  . Disorder of nervous system   . Drug abuse ([place]) 11/21/2017  . Dyslipidemia 04/24/2011  . Elevated troponin 04/28/2012  . Emphysema   . Encephalopathy 11/21/2017  . Essential hypertension 03/22/2016  . Fibula fracture 07/10/2016  . Frequent falls 10/11/2017  . GERD (gastroesophageal reflux disease)   . Gout 04/11/2017  . Heart attack ([place]) 1980s  . History of blood transfusion 1994   "couldn't stop  bleeding from my period"  . History of drug abuse in remission ([place]) 11/28/2015   Quit in 2017  . Hyperlipidemia LDL goal <70   . Incontinence   . Manic depression ([place])   . Morbid obesity ([place]) 10/18/2010  . Obstructive sleep apnea 10/18/2010  . On home oxygen therapy    "6L; 24/7" (04/10/2016)  . OSA on CPAP    "wear mask sometimes" (04/10/2016)  . Paranoid ([place])    "sometimes; I'm on RX for it" (04/10/2016)  . Prolonged Q-T interval on ECG   . Rectal bleeding 12/31/2015  . Right carotid bruit 11/09/2016  . Schizoaffective disorder, bipolar type ([place]) 04/05/2018  . Seasonal allergies   . Seborrheic keratoses 12/31/2013  . Seizures ([place])    "don't know what kind; last one was ~ 1 yr ago" (04/10/2016)  . Stroke [facility]) 1980s   denies residual on 04/10/2016  . Thrush 09/19/2013  . Type 2 diabetes mellitus ([place]) 10/18/2010    Past Surgical History:  Procedure Laterality Date  . CESAREAN SECTION  1997  . HERNIA REPAIR    . IR CHOLANGIOGRAM EXISTING TUBE  07/20/2016  . IR PERC CHOLECYSTOSTOMY  05/10/2016  . IR RADIOLOGIST EVAL & MGMT  06/08/2016  . IR RADIOLOGIST EVAL & MGMT  06/29/2016  . IR SINUS/FIST TUBE CHK-NON GI  07/12/2016  . RIGHT/LEFT  HEART CATH AND CORONARY ANGIOGRAPHY N/A 06/19/2017   Procedure: RIGHT/LEFT HEART CATH AND CORONARY ANGIOGRAPHY;  Surgeon: Jolaine Artist, MD;  Location: [facility];  Service: Cardiovascular;  Laterality: N/A;  . TIBIA IM NAIL INSERTION Right 07/12/2016   Procedure: INTRAMEDULLARY (IM) NAIL RIGHT TIBIA;  Surgeon: Leandrew Koyanagi, MD;  Location: [facility];  Service: Orthopedics;  Laterality: Right;  . UMBILICAL HERNIA REPAIR  ~ 1963   "that's why I don't have a belly button"  . VAGINAL HYSTERECTOMY     Family History:  Family History  Problem Relation Age of [place]set  . Cancer Father        prostate  . Cancer Mother        lung  . Depression Mother   . Depression Sister   . Anxiety disorder Sister   . Schizophrenia Sister   . Bipolar disorder  Sister   . Depression Sister   . Depression Brother   . Heart failure Other        cousin   Family Psychiatric  History: See above.  Social History:  Social History   Substance and Sexual Activity  Alcohol Use No  . Alcohol/week: 0.0 standard drinks     Social History   Substance and Sexual Activity  Drug Use No  . Types: Cocaine   Comment: 04/10/2016 "last used cocaine back in November 2017"    Social History   Socioeconomic History  . Marital status: Widowed    Spouse name: Not on file  . Number of children: 3  . Years of education: Not on file  . Highest education level: Not on file  Occupational History  . Occupation: disabled    Comment: factory production  Tobacco Use  . Smoking status: Former Smoker    Packs/day: 1.50    Years: 38.00    Pack years: 57.00    Types: Cigarettes    Start date: 03/13/1977    Quit date: 04/10/2016    Years since quitting: 3.8  . Smokeless tobacco: Never Used  Vaping Use  . Vaping Use: Never used  Substance and Sexual Activity  . Alcohol use: No    Alcohol/week: 0.0 standard drinks  . Drug use: No    Types: Cocaine    Comment: 04/10/2016 "last used cocaine back in November 2017"  . Sexual activity: Not Currently    Birth control/protection: Surgical  Other Topics Concern  . Not on file  Social History Narrative   Has 1 son, [name]   Lives with son and his boyfriend   Her house has ramps and handrails should she ever needs them.    Her mother lives down the street from her and is a good support person in addition to her son.   She drives herself, has private transportation.    Cocaine free since 02/24/16, smoke free since 04/10/16   Social Determinants of Health   Financial Resource Strain: Not on file  Food Insecurity: Not on file  Transportation Needs: Not on file  Physical Activity: Not on file  Stress: Not on file  Social Connections: Not on file   Additional Social History:    Allergies:   Allergies  Allergen  Reactions  . Hydrocodone Shortness Of Breath  . Hydrocodone-Acetaminophen Shortness Of Breath  . Hydroxyzine Anaphylaxis and Shortness Of Breath  . Latuda [Lurasidone Hcl] Anaphylaxis  . Lurasidone Anaphylaxis  . Magnesium-Containing Compounds Anaphylaxis  . Prednisone Anaphylaxis, Swelling and Other (See Comments)    Tongue swelling  .  Tramadol Anaphylaxis and Swelling  . Codeine Nausea And Vomiting  . Other Rash  . Sulfa Antibiotics Itching  . Tape Rash    Labs:  Results for orders placed or performed during the hospital encounter of 02/18/20 (from the past 48 hour(s))  Glucose, capillary     Status: None   Collection Time: 02/23/20  4:19 PM  Result Value Ref Range   Glucose-Capillary 79 70 - 99 mg/dL    Comment: Glucose reference range applies only to samples taken after fasting for at least 8 hours.   Comment 1 Notify RN    Comment 2 Document in Chart   Hemoglobin and hematocrit, blood     Status: Abnormal   Collection Time: 02/23/20  6:06 PM  Result Value Ref Range   Hemoglobin 10.0 (L) 12.0 - 15.0 g/dL    Comment: DELTA CHECK NOTED REPEATED TO VERIFY RESULTS VERIFIED VIA RECOLLECT    HCT 32.6 (L) 36.0 - 46.0 %    Comment: Performed at [facility], [place] [address] [place], [place] [address]  Glucose, capillary     Status: Abnormal   Collection Time: 02/23/20  7:50 PM  Result Value Ref Range   Glucose-Capillary 104 (H) 70 - 99 mg/dL    Comment: Glucose reference range applies only to samples taken after fasting for at least 8 hours.  Glucose, capillary     Status: None   Collection Time: 02/23/20 11:30 PM  Result Value Ref Range   Glucose-Capillary 77 70 - 99 mg/dL    Comment: Glucose reference range applies only to samples taken after fasting for at least 8 hours.  Glucose, capillary     Status: None   Collection Time: 02/24/20  3:39 AM  Result Value Ref Range   Glucose-Capillary 79 70 - 99 mg/dL    Comment: Glucose reference range applies  only to samples taken after fasting for at least 8 hours.  Glucose, capillary     Status: None   Collection Time: 02/24/20  7:23 AM  Result Value Ref Range   Glucose-Capillary 73 70 - 99 mg/dL    Comment: Glucose reference range applies only to samples taken after fasting for at least 8 hours.  Glucose, capillary     Status: None   Collection Time: 02/24/20 11:49 AM  Result Value Ref Range   Glucose-Capillary 95 70 - 99 mg/dL    Comment: Glucose reference range applies only to samples taken after fasting for at least 8 hours.   Comment 1 Notify RN    Comment 2 Document in Chart   SARS CORONAVIRUS 2 (TAT 6-24 HRS) Nasopharyngeal Nasopharyngeal Swab     Status: None   Collection Time: 02/24/20  1:49 PM   Specimen: Nasopharyngeal Swab  Result Value Ref Range   SARS Coronavirus 2 NEGATIVE NEGATIVE    Comment: (NOTE) SARS-CoV-2 target nucleic acids are NOT DETECTED.  The SARS-CoV-2 RNA is generally detectable in upper and lower respiratory specimens during the acute phase of infection. Negative results do not preclude SARS-CoV-2 infection, do not rule out co-infections with other pathogens, and should not be used as the sole basis for treatment or other patient management decisions. Negative results must be combined with clinical observations, patient history, and epidemiological information. The expected result is Negative.  Fact Sheet for Patients: SugarRoll.be  Fact Sheet for Healthcare Providers: https://www.woods-mathews.com/  This test is not yet approved or cleared by the [place] FDA and  has been authorized for detection and/or diagnosis  of SARS-CoV-2 by FDA under an Emergency Use Authorization (EUA). This EUA will remain  in effect (meaning this test can be used) for the duration of the COVID-19 declaration under Se ction 564(b)(1) of the Act, 21 U.S.C. section 360bbb-3(b)(1), unless the authorization is terminated or revoked  sooner.  Performed at [facility], [place] [address], [place], [place] [address]   Glucose, capillary     Status: None   Collection Time: 02/24/20  3:30 PM  Result Value Ref Range   Glucose-Capillary 98 70 - 99 mg/dL    Comment: Glucose reference range applies only to samples taken after fasting for at least 8 hours.   Comment 1 Notify RN    Comment 2 Document in Chart   Glucose, capillary     Status: Abnormal   Collection Time: 02/24/20  7:33 PM  Result Value Ref Range   Glucose-Capillary 103 (H) 70 - 99 mg/dL    Comment: Glucose reference range applies only to samples taken after fasting for at least 8 hours.  Glucose, capillary     Status: None   Collection Time: 02/24/20 11:46 PM  Result Value Ref Range   Glucose-Capillary 87 70 - 99 mg/dL    Comment: Glucose reference range applies only to samples taken after fasting for at least 8 hours.  Glucose, capillary     Status: None   Collection Time: 02/25/20  3:55 AM  Result Value Ref Range   Glucose-Capillary 82 70 - 99 mg/dL    Comment: Glucose reference range applies only to samples taken after fasting for at least 8 hours.   Comment 1 Notify RN    Comment 2 Document in Chart   Renal function panel     Status: Abnormal   Collection Time: 02/25/20  5:42 AM  Result Value Ref Range   Sodium 141 135 - 145 mmol/L   Potassium 4.4 3.5 - 5.1 mmol/L   Chloride 104 98 - 111 mmol/L   CO2 28 22 - 32 mmol/L   Glucose, Bld 86 70 - 99 mg/dL    Comment: Glucose reference range applies only to samples taken after fasting for at least 8 hours.   BUN 18 6 - 20 mg/dL   Creatinine, Ser 1.15 (H) 0.44 - 1.00 mg/dL   Calcium 8.5 (L) 8.9 - 10.3 mg/dL   Phosphorus 3.6 2.5 - 4.6 mg/dL   Albumin 2.6 (L) 3.5 - 5.0 g/dL   GFR, Estimated 55 (L) >60 mL/min    Comment: (NOTE) Calculated using the CKD-EPI Creatinine Equation (2021)    Anion gap 9 5 - 15    Comment: Performed at [facility], [place] [address] [place], [place]  [address]  CBC with Differential/Platelet     Status: Abnormal   Collection Time: 02/25/20  5:42 AM  Result Value Ref Range   WBC 8.6 4.0 - 10.5 K/uL   RBC 2.93 (L) 3.87 - 5.11 MIL/uL   Hemoglobin 8.4 (L) 12.0 - 15.0 g/dL   HCT 26.5 (L) 36.0 - 46.0 %   MCV 90.4 80.0 - 100.0 fL   MCH 28.7 26.0 - 34.0 pg   MCHC 31.7 30.0 - 36.0 g/dL   RDW 15.9 (H) 11.5 - 15.5 %   Platelets 310 150 - 400 K/uL   nRBC 0.0 0.0 - 0.2 %   Neutrophils Relative % 47 %   Neutro Abs 4.0 1.7 - 7.7 K/uL   Lymphocytes Relative 36 %   Lymphs Abs 3.1 0.7 -  4.0 K/uL   Monocytes Relative 10 %   Monocytes Absolute 0.8 0.1 - 1.0 K/uL   Eosinophils Relative 3 %   Eosinophils Absolute 0.3 0.0 - 0.5 K/uL   Basophils Relative 0 %   Basophils Absolute 0.0 0.0 - 0.1 K/uL   Immature Granulocytes 4 %   Abs Immature Granulocytes 0.32 (H) 0.00 - 0.07 K/uL    Comment: Performed at [facility], [place] [address], [place], [place] [address]  Magnesium     Status: Abnormal   Collection Time: 02/25/20  5:42 AM  Result Value Ref Range   Magnesium 1.6 (L) 1.7 - 2.4 mg/dL    Comment: Performed at [facility], [place] [address], [place], [place] [address]  Glucose, capillary     Status: None   Collection Time: 02/25/20  7:30 AM  Result Value Ref Range   Glucose-Capillary 89 70 - 99 mg/dL    Comment: Glucose reference range applies only to samples taken after fasting for at least 8 hours.   *Note: Due to a large number of results and/or encounters for the requested time period, some results have not been displayed. A complete set of results can be found in Results Review.    Medications:  Current Facility-Administered Medications  Medication Dose Route Frequency Provider Last Rate Last Admin  . acetaminophen (TYLENOL) tablet 650 mg  650 mg Oral Q4H PRN Julian Hy, DO   650 mg at 02/24/20 2000  . albuterol (VENTOLIN HFA) 108 (90 Base) MCG/ACT inhaler 2 puff  2 puff Inhalation Q6H PRN Eugenie Filler, MD      . amLODipine (NORVASC) tablet 10 mg  10 mg Oral Daily Eugenie Filler, MD   10 mg at 02/25/20 6283  . Asenapine Maleate (SAPHRIS) sublingual tablet 10 mg  10 mg Sublingual TID Suella Broad, FNP   10 mg at 02/25/20 0939  . Chlorhexidine Gluconate Cloth 2 % PADS 6 each  6 each Topical Q0600 Julian Hy, DO   6 each at 02/21/20 1117  . colchicine tablet 0.6 mg  0.6 mg Oral BID Eugenie Filler, MD   0.6 mg at 02/25/20 1517  . diazepam (VALIUM) tablet 10 mg  10 mg Oral TID PRN Suella Broad, FNP   10 mg at 02/24/20 0924  . diphenhydrAMINE (BENADRYL) injection 12.5 mg  12.5 mg Intravenous Q6H PRN Eugenie Filler, MD   12.5 mg at 02/21/20 1041  . docusate sodium (COLACE) capsule 100 mg  100 mg Oral BID PRN Noemi Chapel P, DO   100 mg at 02/19/20 2359  . febuxostat (ULORIC) tablet 40 mg  40 mg Oral Daily Eugenie Filler, MD   40 mg at 02/25/20 6160  . feeding supplement (ENSURE ENLIVE / ENSURE PLUS) liquid 237 mL  237 mL Oral BID BM Eugenie Filler, MD   237 mL at 02/25/20 0940  . feeding supplement (JEVITY 1.2 CAL) liquid 1,000 mL  1,000 mL Per Tube Continuous Eugenie Filler, MD 60 mL/hr at 02/23/20 1046 1,000 mL at 02/23/20 1046  . umeclidinium bromide (INCRUSE ELLIPTA) 62.5 MCG/INH 1 puff  1 puff Inhalation Daily Eugenie Filler, MD   1 puff at 02/25/20 0748   And  . fluticasone furoate-vilanterol (BREO ELLIPTA) 100-25 MCG/INH 1 puff  1 puff Inhalation Daily Eugenie Filler, MD   1 puff at 02/25/20 [phone]  . folic acid (FOLVITE) tablet 1 mg  1 mg Oral Daily [place] Silos,  Malachy Moan, MD   1 mg at 02/25/20 0940  . gabapentin (NEURONTIN) capsule 300 mg  300 mg Oral Daily Suella Broad, FNP   300 mg at 02/25/20 0940  . heparin injection 5,000 Units  5,000 Units Subcutaneous Q8H ClarkVenita Sheffield, DO   5,000 Units at 02/25/20 0515  . hydrALAZINE (APRESOLINE) injection 10-20 mg  10-20 mg Intravenous Q4H PRN Frederik Pear, MD   10 mg at 02/24/20  0541  . hydrALAZINE (APRESOLINE) tablet 50 mg  50 mg Oral BID Eugenie Filler, MD   50 mg at 02/25/20 1700  . lisdexamfetamine (VYVANSE) capsule 20 mg  20 mg Oral Q breakfast Suella Broad, FNP   20 mg at 02/25/20 1749  . montelukast (SINGULAIR) tablet 10 mg  10 mg Oral QHS Eugenie Filler, MD   10 mg at 02/24/20 2112  . multivitamin with minerals tablet 1 tablet  1 tablet Oral Daily Eugenie Filler, MD   1 tablet at 02/25/20 [phone]  . pantoprazole (PROTONIX) EC tablet 40 mg  40 mg Oral Daily Eugenie Filler, MD   40 mg at 02/25/20 7591  . polyethylene glycol (MIRALAX / GLYCOLAX) packet 17 g  17 g Oral Daily PRN Noemi Chapel P, DO      . prazosin (MINIPRESS) capsule 2 mg  2 mg Oral QHS Suella Broad, FNP   2 mg at 02/24/20 2113  . saccharomyces boulardii (FLORASTOR) capsule 250 mg  250 mg Oral BID Eugenie Filler, MD   250 mg at 02/25/20 6384  . thiamine tablet 100 mg  100 mg Oral Daily Eugenie Filler, MD   100 mg at 02/25/20 6659  . traZODone (DESYREL) tablet 100 mg  100 mg Oral QHS Suella Broad, FNP   100 mg at 02/24/20 2112    Musculoskeletal:   Psychiatric Specialty Exam: Physical Exam Nursing note reviewed.  Neurological:     Mental Status: [name] alert and oriented to person, place, and time.     Review of Systems  Psychiatric/Behavioral: The patient is nervous/anxious.   All other systems reviewed and are negative.   Blood pressure 99/89, pulse 77, temperature 98.5 F (36.9 C), temperature source Oral, resp. rate 18, height 5\' 2"  (1.575 m), weight 68.2 kg, SpO2 98 %.Body mass index is 27.5 kg/m.  General Appearance: Fairly Groomed  Eye Contact:  Good  Speech:  Clear and Coherent and Normal Rate  Volume:  Normal  Mood:  Euthymic  Affect:  Appropriate and Congruent  Thought Process:  Coherent, Goal Directed, Linear and Descriptions of Associations: Intact  Orientation:  Full (Time, Place, and Person)  Thought Content:  WDL   Suicidal Thoughts:  No  Homicidal Thoughts:  No  Memory:  Immediate;   Good Recent;   Good  Judgement:  Good  Insight:  Fair  Psychomotor Activity:  Normal  Concentration: UTA  Recall:  [employer] of Knowledge:  Fair  Language:  Fair  Akathisia:  No  Handed:  Right  AIMS (if indicated):     Assets:  Housing Resilience  ADL's:  Intact  Cognition:  WNL  Sleep:      Patient with paranoid schizophrenia that appears to be stable, and compliant with her outpatient psychiatric medications. At baseline she has chronic delusions of paranoia, in which has been difficult to achieve complete cessation.She has some appropriate insight and improved judgement at this time, she has brighten affect and well informed. [name]  able to make appropriate decisions and has capacity to make decisions. As noted at baseline she has chronic delusions related to her schizophrenia.  She has been compliant with her medications, and all outpatient visit follow up. She denies suicidal ideation, homicidal ideation and or auditory visual hallucinations. Patient appears to be psychiatrically stable at her baseline.   Treatment Plan Summary: Plan Home medications have been resumed. [name] bak to her baseline at this time. Will psychiatrically clear this patient.  Patient with improved serial EKGs, QTc average 440s which is back at her baseline.  -Patient can resume her home medications at regular doses once medically appropriate, will adjust as necessary with the exception of Vyvanse. Patient to follow up with outpatient psychiatry about ongoing need for this medication at high doses.  -Patient appears to be psychiatrically stable at her baseline. We can discontinue safety sitter and rescind her IVC. [name] now stable and no longer a risk to herself or others. [name] able to answer all questions appropriate and has capacity to make appropriate decisions.  Disposition: No evidence of imminent risk to self or others at  present.   Psych cleared Condition can be safely managed in an outpatient setting. Patient may require increase in level of care to include [employer] or ACT team.   Continue follow-up with outpatient psychiatric providers.   Suella Broad, FNP 02/25/2020 12:25 PM

## 2020-02-25 NOTE — Plan of Care (Signed)
  Problem: Education: Goal: Knowledge of General Education information will improve Description: Including pain rating scale, medication(s)/side effects and non-pharmacologic comfort measures Outcome: Progressing   Problem: Safety: Goal: Ability to remain free from injury will improve Outcome: Progressing   Problem: Skin Integrity: Goal: Risk for impaired skin integrity will decrease Outcome: Progressing   Problem: Clinical Measurements: Goal: Will remain free from infection Outcome: Adequate for Discharge Goal: Respiratory complications will improve Outcome: Adequate for Discharge

## 2020-02-25 NOTE — TOC Progression Note (Addendum)
Transition of Care [facility]) - Progression Note    Patient Details  Name: Tiffany [name] MRN: [id number] Date of Birth: [birth year]  Transition of Care [facility]) CM/SW Contact  Carsyn Taubman, Juliann Pulse, RN Phone Number: 02/25/2020, 11:10 AM  Clinical Narrative:  Patient has been evaluated for level 2  pasrr-await pasrr. No bed offers. Psych to follow-noted IVC will expire today.Patient on the Difficult to place list for SNF. 2:48p-IVC expired today;Per Psych-patient has capacity for own decision making;sitter d/c. No bed offers for SNF.On difficult to place list.    Expected Discharge Plan: [place] Barriers to Discharge: Continued Medical Work up  Expected Discharge Plan and Services Expected Discharge Plan: [place] arrangements for the past 2 months: Apartment                                       Social Determinants of Health (SDOH) Interventions    Readmission Risk Interventions Readmission Risk Prevention Plan 02/23/2020  Transportation Screening Complete  Medication Review [employer]) Complete  PCP or Specialist appointment within 3-5 days of discharge Complete  HRI or Home Care Consult Complete  SW Recovery Care/Counseling Consult Complete  Palliative Care Screening Complete  Skilled Nursing Facility Complete  Some recent data might be hidden

## 2020-02-25 NOTE — Progress Notes (Signed)
PROGRESS NOTE    Tiffany [name]  WCH:[id number] DOB: 1960/10/16 DOA: 02/18/2020 PCP: [name], MD    Chief Complaint  Patient presents with  . Psychiatric Evaluation    Brief Narrative:  Tiffany [name] is a 60 y/o woman with a history of schizoaffective disorder who was brought to the hospital for abnormal behavior due to need for inpatient psychiatric treatment. Per ED records she has been not eating for several days at home and has been barricading herself inside. She has not been taking her psychiatric medications. IVC initiated by APS. Her initial labs were significantly abnormal with AKI, hypokalemia, hypomagnesemia, incompletely compensated metabolic acidosis, hypoglycemia. She was evaluated in the ED and started on aggressive IVF resuscitation, and electrolyte repletion. Patient on admission was unable to provide much history. Patient was brought in an IVC for hallucinations, paranoid behavior.  Patient admitted to the critical care team, nephrology consulted due to acute renal failure,PANDA tube placed for feeding. Patient's electrolytes repleted. Patient transferred to [facility] team 02/20/2020.    Assessment & Plan:   Principal Problem:   ARF (acute renal failure) (HCC) Active Problems:   Type 2 diabetes mellitus (HCC)   Dyslipidemia   COPD (chronic obstructive pulmonary disease) (HCC)   Tobacco use disorder   Hyponatremia   Essential hypertension   Hypokalemia   CKD (chronic kidney disease) stage 3, GFR 30-59 ml/min (HCC)   Hypomagnesemia   Gout   Drug abuse (HCC)   Schizoaffective disorder, bipolar type (HCC)   E. coli UTI   Acute metabolic encephalopathy   Psychosis (HCC)   Schizophrenia, schizo-affective ([place])   Metabolic acidosis   Hypocalcemia  1 acute renal failure on chronic kidney disease stage IIIb (baseline creatinine 1.2-1.5) Likely secondary to prerenal azotemia in the setting of poor oral intake, NSAID use. Creatinine on admission was 6.26.  Urinalysis done with large leukocytes, nitrite negative, 100 protein, 21-50 WBCs. Urine sodium noted at 17. Urine creatinine at 300.84. Urine osmolality of 300. Renal ultrasound done was negative for hydronephrosis, showed increased echogenicity of bilateral kidneys likely reflecting medical renal disease, large cystic structure superior to the bladder not significant change in comparison to prior imaging. Patient placed on IV fluids.  Acute kidney injury resolved.  Creatinine 1.15 on 2/9.  Nephrology signed off.  IV fluids discontinued.  2. Hypokalemia/hypomagnesemia Potassium replaced.  Magnesium 1.6, replace and follow.  3. Hyperphosphatemia>>>> hypophosphatemia Resolved.  4. Acute metabolic acidosis/high anion gap Likely secondary to problem #1 and probable starvation ketosis. Lactate normal. UDS was positive for benzodiazepines and amphetamines. Salicylate levels normal.  Patient was initially on bicarb drip.  Acidosis resolved.  5. Hypotension/hypertension Resolved.  Reasonably controlled with meds.  6.  E. coli UTI Urine cultures with > 100,000 colonies of E. coli resistant to ampicillin and Bactrim, sensitive to the cephalosporins, Cipro, gent.. Transitioned from IV Rocephin to IV cefazolin to complete a 5-day course of treatment.  Completed course of antibiotics.  7. Hypocalcemia Resolved.  8. Acute metabolic encephalopathy Likely secondary to problem #1 in the setting of schizoaffective disorder. Slowly improving with current treatment of IV fluids.  IV fluids have been saline lock.  On IV antibiotics for E. coli UTI.  Improving clinically.  Psychiatry following and adjusting psychiatric medications.  Supportive care.  Baseline mental status not clear.  It is possible that her current status is back to her baseline.  9. Schizoaffective disorder Patient noted to have a history of schizoaffective disorder with psychosis on presentation noted to have  barricaded herself inside the house  and not eating. Patient stated she felt her son was going to hurt her and still believes he is trying to hurt her. Patient denied any visual hallucinations, denied any auditory hallucinations.  Was involuntarily committed.  Psychiatry follow-up 2/9 appreciated and they indicate that patient has mental capacity to make medical decisions, no longer require sitter nor inpatient psychiatric care.  She reportedly has chronic delusions related to her schizophrenia.  Her home medications have been resumed.  Outpatient follow-up with psychiatry.  IVC rescinded.  10.  Well-controlled diabetes mellitus type 2 with hypoglycemia Patient noted to be hypoglycemic on presentation likely secondary to poor oral intake.  CBG controlled.  SSI discontinued.  A1c 4.8.  11. Anemia/folate deficiency Likely secondary to malnutrition. Patient with no overt bleeding. Anemia panel with a folate deficiency and also consistent with anemia of chronic disease.  Continue folic acid 1 mg daily.  Follow H&H.  Transfusion threshold hemoglobin < 7.  Hemoglobin stable.  12. Leukopenia Resolved.  13. Hyponatremia Likely secondary to hypovolemic hyponatremia.  Resolved with hydration.   14. Malnutrition Patient with history of significant weight loss. Patient with risk of refeeding syndrome.  Was on Panda tube feeds earlier, patient pulled out Panda tube.  Tolerating diet.    DVT prophylaxis: Heparin Code Status: Full Family Communication: Updated patient. No family at bedside. Disposition:   Status is: Inpatient    Dispo: The patient is from: Home              Anticipated d/c is to: SNF.               Anticipated d/c date is: 1 day pending SNF bed.  As per discussion with TOC team, likely difficult to place.   Difficult to place patient: Yes      Consultants:   Psychiatry: Sheran Fava, [place] 02/20/2020  Nephrology: Dr. Marval Regal 02/18/2020  PCCM admission  Procedures:   Chest x-ray 02/18/2020  Abdominal  films 02/18/2020  Renal ultrasound 02/18/2020  Antimicrobials:   IV Rocephin 02/20/2020>>>>> 02/22/2020  IV cefazolin 02/23/2020>>> 02/24/2020   Subjective: Patient seen along with sitter at bedside.  Having her breakfast.  Alert and oriented x3.  Able to say that she is in the hospital because she had a "incident with my son".  Reports feet swelling.  Objective: Vitals:   02/25/20 0618 02/25/20 0734 02/25/20 0749 02/25/20 1222  BP: (!) 152/93 (!) 139/94  99/89  Pulse: 87 88  77  Resp: 18 18  18   Temp: 98.1 F (36.7 C) 98.2 F (36.8 C)  98.5 F (36.9 C)  TempSrc: Axillary Oral  Oral  SpO2:  99% 99% 98%  Weight:      Height:        Intake/Output Summary (Last 24 hours) at 02/25/2020 1546 Last data filed at 02/25/2020 1500 Gross per 24 hour  Intake 745.73 ml  Output 2600 ml  Net -1854.27 ml   Filed Weights   02/21/20 0500 02/24/20 0518 02/25/20 0500  Weight: 70 kg 69.1 kg 68.2 kg    Examination:  General exam: Middle-age female, looks older than stated age, moderately built, thinly nourished, sitting up comfortably in bed eating breakfast. Respiratory system: Clear to auscultation.  No increased work of breathing. Cardiovascular system: RRR no murmurs rubs or gallops.  No JVD.  No lower extremity edema.  Not on telemetry. Gastrointestinal system: Abdomen is soft, nontender, nondistended, positive bowel sounds.  No rebound.  No guarding.  Central  nervous system: Alert and oriented x3. No focal neurological deficits. Extremities: Symmetric 5 x 5 power. Skin: No rashes, lesions or ulcers Psychiatry: Judgement and insight appear poor but seems to have improved compared to prior. Mood & affect pleasant and appropriate    Data Reviewed: I have personally reviewed following labs and imaging studies  CBC: Recent Labs  Lab 02/20/20 0258 02/21/20 0253 02/22/20 0554 02/23/20 0456 02/23/20 1806 02/25/20 0542  WBC 5.3 3.7* 4.8 5.7  --  8.6  NEUTROABS  --  2.1 2.4 2.7  --  4.0   HGB 8.7* 8.4* 9.0* 7.7* 10.0* 8.4*  HCT 26.7* 26.4* 28.7* 24.2* 32.6* 26.5*  MCV 87.5 87.7 88.9 88.3  --  90.4  PLT 335 297 300 272  --  657    Basic Metabolic Panel: Recent Labs  Lab 02/20/20 0258 02/21/20 0253 02/22/20 0554 02/23/20 0456 02/25/20 0542  NA 134* 137 140 137 141  K 3.8 3.1* 3.6 4.0 4.4  CL 103 107 106 104 104  CO2 19* 22 23 24 28   GLUCOSE 111* 106* 91 97 86  BUN 59* 53* 34* 27* 18  CREATININE 4.60* 2.79* 1.50* 1.18* 1.15*  CALCIUM 7.9* 8.4* 8.7* 8.4* 8.5*  MG 2.0 1.6* 1.9 1.5* 1.6*  PHOS 2.8 1.2* 2.6 2.8 3.6    GFR: Estimated Creatinine Clearance: 47.1 mL/min (A) (by C-G formula based on SCr of 1.15 mg/dL (H)).  Liver Function Tests: Recent Labs  Lab 02/19/20 0251 02/21/20 0253 02/22/20 0554 02/23/20 0456 02/25/20 0542  ALBUMIN 2.8* 2.3* 2.4* 2.3* 2.6*    CBG: Recent Labs  Lab 02/24/20 1530 02/24/20 1933 02/24/20 2346 02/25/20 0355 02/25/20 0730  GLUCAP 98 103* 87 82 89     Recent Results (from the past 240 hour(s))  Resp Panel by RT-PCR (Flu A&B, Covid) Nasopharyngeal Swab     Status: None   Collection Time: 02/18/20  1:44 PM   Specimen: Nasopharyngeal Swab; Nasopharyngeal(NP) swabs in vial transport medium  Result Value Ref Range Status   SARS Coronavirus 2 by RT PCR NEGATIVE NEGATIVE Final    Comment: (NOTE) SARS-CoV-2 target nucleic acids are NOT DETECTED.  The SARS-CoV-2 RNA is generally detectable in upper respiratory specimens during the acute phase of infection. The lowest concentration of SARS-CoV-2 viral copies this assay can detect is 138 copies/mL. A negative result does not preclude SARS-Cov-2 infection and should not be used as the sole basis for treatment or other patient management decisions. A negative result may occur with  improper specimen collection/handling, submission of specimen other than nasopharyngeal swab, presence of viral mutation(s) within the areas targeted by this assay, and inadequate number of  viral copies(<138 copies/mL). A negative result must be combined with clinical observations, patient history, and epidemiological information. The expected result is Negative.  Fact Sheet for Patients:  EntrepreneurPulse.com.au  Fact Sheet for Healthcare Providers:  IncredibleEmployment.be  This test is no t yet approved or cleared by the [place] FDA and  has been authorized for detection and/or diagnosis of SARS-CoV-2 by FDA under an Emergency Use Authorization (EUA). This EUA will remain  in effect (meaning this test can be used) for the duration of the COVID-19 declaration under Section 564(b)(1) of the Act, 21 U.S.C.section 360bbb-3(b)(1), unless the authorization is terminated  or revoked sooner.       Influenza A by PCR NEGATIVE NEGATIVE Final   Influenza B by PCR NEGATIVE NEGATIVE Final    Comment: (NOTE) The Xpert Xpress SARS-CoV-2/FLU/RSV plus assay is intended  as an aid in the diagnosis of influenza from Nasopharyngeal swab specimens and should not be used as a sole basis for treatment. Nasal washings and aspirates are unacceptable for Xpert Xpress SARS-CoV-2/FLU/RSV testing.  Fact Sheet for Patients: EntrepreneurPulse.com.au  Fact Sheet for Healthcare Providers: IncredibleEmployment.be  This test is not yet approved or cleared by the [place] FDA and has been authorized for detection and/or diagnosis of SARS-CoV-2 by FDA under an Emergency Use Authorization (EUA). This EUA will remain in effect (meaning this test can be used) for the duration of the COVID-19 declaration under Section 564(b)(1) of the Act, 21 U.S.C. section 360bbb-3(b)(1), unless the authorization is terminated or revoked.  Performed at [facility], [place] [address] [place], [place] [address]   MRSA PCR Screening     Status: None   Collection Time: 02/18/20  6:58 PM   Specimen: Nasopharyngeal   Result Value Ref Range Status   MRSA by PCR NEGATIVE NEGATIVE Final    Comment:        The GeneXpert MRSA Assay (FDA approved for NASAL specimens only), is one component of a comprehensive MRSA colonization surveillance program. It is not intended to diagnose MRSA infection nor to guide or monitor treatment for MRSA infections. Performed at [facility], [place] [address] [place], [place] [address]   Urine culture     Status: Abnormal   Collection Time: 02/19/20  2:00 AM   Specimen: Urine, Clean Catch  Result Value Ref Range Status   Specimen Description   Final    URINE, CLEAN CATCH Performed at [facility], [place] [address], [place], [place] [address]    Special Requests   Final    NONE Performed at [facility], [place] [address] [place], [place] [address]    Culture >=100,000 COLONIES/mL ESCHERICHIA COLI (A)  Final   Report Status 02/21/2020 FINAL  Final   Organism ID, Bacteria ESCHERICHIA COLI (A)  Final      Susceptibility   Escherichia coli - MIC*    AMPICILLIN >=32 RESISTANT Resistant     CEFAZOLIN <=4 SENSITIVE Sensitive     CEFEPIME <=0.12 SENSITIVE Sensitive     CEFTRIAXONE <=0.25 SENSITIVE Sensitive     CIPROFLOXACIN 1 SENSITIVE Sensitive     GENTAMICIN <=1 SENSITIVE Sensitive     IMIPENEM <=0.25 SENSITIVE Sensitive     NITROFURANTOIN <=16 SENSITIVE Sensitive     TRIMETH/SULFA >=320 RESISTANT Resistant     AMPICILLIN/SULBACTAM 16 INTERMEDIATE Intermediate     PIP/TAZO <=4 SENSITIVE Sensitive     * >=100,000 COLONIES/mL ESCHERICHIA COLI  SARS CORONAVIRUS 2 (TAT 6-24 HRS) Nasopharyngeal Nasopharyngeal Swab     Status: None   Collection Time: 02/24/20  1:49 PM   Specimen: Nasopharyngeal Swab  Result Value Ref Range Status   SARS Coronavirus 2 NEGATIVE NEGATIVE Final    Comment: (NOTE) SARS-CoV-2 target nucleic acids are NOT DETECTED.  The SARS-CoV-2 RNA is generally detectable in upper and  lower respiratory specimens during the acute phase of infection. Negative results do not preclude SARS-CoV-2 infection, do not rule out co-infections with other pathogens, and should not be used as the sole basis for treatment or other patient management decisions. Negative results must be combined with clinical observations, patient history, and epidemiological information. The expected result is Negative.  Fact Sheet for Patients: SugarRoll.be  Fact Sheet for Healthcare Providers: https://www.woods-mathews.com/  This test is not yet approved or cleared by the [place] FDA and  has  been authorized for detection and/or diagnosis of SARS-CoV-2 by FDA under an Emergency Use Authorization (EUA). This EUA will remain  in effect (meaning this test can be used) for the duration of the COVID-19 declaration under Se ction 564(b)(1) of the Act, 21 U.S.C. section 360bbb-3(b)(1), unless the authorization is terminated or revoked sooner.  Performed at [facility], [place] [address], [place], [place] [address]          Radiology Studies: No results found.      Scheduled Meds: . amLODipine  10 mg Oral Daily  . Asenapine Maleate  10 mg Sublingual TID  . Chlorhexidine Gluconate Cloth  6 each Topical Q0600  . colchicine  0.6 mg Oral BID  . febuxostat  40 mg Oral Daily  . feeding supplement  237 mL Oral BID BM  . umeclidinium bromide  1 puff Inhalation Daily   And  . fluticasone furoate-vilanterol  1 puff Inhalation Daily  . folic acid  1 mg Oral Daily  . gabapentin  300 mg Oral Daily  . heparin  5,000 Units Subcutaneous Q8H  . hydrALAZINE  50 mg Oral BID  . lisdexamfetamine  20 mg Oral Q breakfast  . montelukast  10 mg Oral QHS  . multivitamin with minerals  1 tablet Oral Daily  . pantoprazole  40 mg Oral Daily  . prazosin  2 mg Oral QHS  . saccharomyces boulardii  250 mg Oral BID  . thiamine  100 mg Oral Daily  . traZODone   100 mg Oral QHS   Continuous Infusions: . feeding supplement (JEVITY 1.2 CAL) 1,000 mL (02/23/20 1046)     LOS: 7 days    Time spent: 35 minutes    Vernell Leep, MD [facility]   To contact the attending provider between 7A-7P or the covering provider during after hours 7P-7A, please log into the web site [email or web address] and access using universal [place] password for that web site. If you do not have the password, please call the hospital operator.  02/25/2020, 3:46 PM

## 2020-02-25 NOTE — Progress Notes (Signed)
Physical Therapy Treatment Patient Details Name: Tiffany [name] MRN: [id number] DOB: Feb 16, 1960 Today's Date: 02/25/2020    History of Present Illness 60 y/o woman with a history of schizoaffective disorder who was brought to the hospital for abnormal behavior, IVC for hallucinations, paranoid behavior.  Per ED records she has been not eating for several days at home and has been barricading herself inside. She has not been taking her psychiatric medications. IVC initiated by APS. Her initial labs were significantly abnormal with AKI, hypokalemia, hypomagnesemia, incompletely compensated metabolic acidosis, hypoglycemia.  Patient on admission was unable to provide much history.   Patient admitted to the critical care team, nephrology consulted due to acute renal failure.  Patient transferred to [facility] team 02/20/2020. Psych consulted    PT Comments    Progressing slowly with mobility. Pt required Mod assist +2 to stand and to take a few steps in the room with use of a RW. Pt was tearful during session. She reported radiating pain in bil LEs. Will continue to follow and progress activity as tolerated. Continue to recommend SNF.     Follow Up Recommendations  SNF     Equipment Recommendations  None recommended by PT    Recommendations for Other Services       Precautions / Restrictions Precautions Precautions: Fall Precaution Comments: panda tube Restrictions Weight Bearing Restrictions: No    Mobility  Bed Mobility               General bed mobility comments: oob in recliner  Transfers Overall transfer level: Needs assistance Equipment used: Rolling walker (2 wheeled) Transfers: Sit to/from Stand Sit to Stand: Mod assist;+2 physical assistance;+2 safety/equipment         General transfer comment: Assist to rise, steady. Increased time. Cues for safety, technique, hand/LE placement. Pt became tearful once standing.  Ambulation/Gait Mod Assist; +2 physical  assistance; +2 safety/equipment Gait Distance (Feet): 3 Feet Assistive device: Rolling walker (2 wheeled) Gait Pattern/deviations: Step-to pattern;Decreased step length - right;Decreased step length - left;Shuffle     General Gait Details: Pt took a few steps forward with the RW. Assist to stabilize pt, manage RW, and advance LEs at times. Followed closely with recliner.   Stairs             Wheelchair Mobility    Modified Rankin (Stroke Patients Only)       Balance Overall balance assessment: Needs assistance         Standing balance support: Bilateral upper extremity supported Standing balance-Leahy Scale: Fair                              Cognition Arousal/Alertness: Awake/alert Behavior During Therapy: Flat affect Overall Cognitive Status: No family/caregiver present to determine baseline cognitive functioning                         Following Commands: Follows one step commands with increased time     Problem Solving: Slow processing;Decreased initiation;Requires verbal cues        Exercises      General Comments        Pertinent Vitals/Pain Pain Assessment: Faces Faces Pain Scale: Hurts whole lot Pain Location: back radiating to LEs Pain Descriptors / Indicators: Crying;Sharp Pain Intervention(s): Limited activity within patient's tolerance;Monitored during session;Repositioned    Home Living  Prior Function            PT Goals (current goals can now be found in the care plan section) Progress towards PT goals: Progressing toward goals    Frequency    Min 2X/week      PT Plan Current plan remains appropriate    Co-evaluation              AM-PAC PT "6 Clicks" Mobility   Outcome Measure  Help needed turning from your back to your side while in a flat bed without using bedrails?: A Lot Help needed moving from lying on your back to sitting on the side of a flat bed without  using bedrails?: A Lot Help needed moving to and from a bed to a chair (including a wheelchair)?: A Lot Help needed standing up from a chair using your arms (e.g., wheelchair or bedside chair)?: A Lot Help needed to walk in hospital room?: A Lot Help needed climbing 3-5 steps with a railing? : Total 6 Click Score: 11    End of Session Equipment Utilized During Treatment: Gait belt Activity Tolerance: Patient limited by pain Patient left: in chair;with call bell/phone within reach;with nursing/sitter in room   PT Visit Diagnosis: Difficulty in walking, not elsewhere classified (R26.2);Muscle weakness (generalized) (M62.81);Pain     Time: 1405-1420 PT Time Calculation (min) (ACUTE ONLY): 15 min  Charges:  $Gait Training: 8-22 mins              Doreatha Massed, PT Acute Rehabilitation  Office: [phone] Pager: [phone]

## 2020-02-26 ENCOUNTER — Telehealth (HOSPITAL_COMMUNITY): Payer: Medicaid Other | Admitting: Psychiatry

## 2020-02-26 ENCOUNTER — Telehealth: Payer: Self-pay | Admitting: Physical Medicine and Rehabilitation

## 2020-02-26 ENCOUNTER — Telehealth: Payer: Self-pay | Admitting: Family Medicine

## 2020-02-26 LAB — RENAL FUNCTION PANEL
Albumin: 2.5 g/dL — ABNORMAL LOW (ref 3.5–5.0)
Anion gap: 8 (ref 5–15)
BUN: 16 mg/dL (ref 6–20)
CO2: 28 mmol/L (ref 22–32)
Calcium: 8.8 mg/dL — ABNORMAL LOW (ref 8.9–10.3)
Chloride: 104 mmol/L (ref 98–111)
Creatinine, Ser: 1.21 mg/dL — ABNORMAL HIGH (ref 0.44–1.00)
GFR, Estimated: 51 mL/min — ABNORMAL LOW (ref >60)
Glucose, Bld: 84 mg/dL (ref 70–99)
Phosphorus: 3.8 mg/dL (ref 2.5–4.6)
Potassium: 4.3 mmol/L (ref 3.5–5.1)
Sodium: 140 mmol/L (ref 135–145)

## 2020-02-26 LAB — GLUCOSE, CAPILLARY
Glucose-Capillary: 71 mg/dL (ref 70–99)
Glucose-Capillary: 73 mg/dL (ref 70–99)

## 2020-02-26 LAB — MAGNESIUM: Magnesium: 1.7 mg/dL (ref 1.7–2.4)

## 2020-02-26 MED ORDER — LISDEXAMFETAMINE DIMESYLATE 20 MG PO CAPS: 20.0000 mg | ORAL_CAPSULE | Freq: Every day | ORAL | Status: DC

## 2020-02-26 MED ORDER — AMLODIPINE BESYLATE 10 MG PO TABS: 10.0000 mg | ORAL_TABLET | Freq: Every day | ORAL | Status: DC

## 2020-02-26 MED ORDER — DIAZEPAM 10 MG PO TABS: 10.0000 mg | ORAL_TABLET | Freq: Three times a day (TID) | ORAL | 0 refills | Status: DC | PRN

## 2020-02-26 MED ORDER — MAGNESIUM SULFATE 4 GM/100ML IV SOLN
4.0000 g | Freq: Once | INTRAVENOUS | Status: AC
  Administered 2020-02-26: 4 g via INTRAVENOUS
  Filled 2020-02-26: qty 100

## 2020-02-26 MED ORDER — FOLIC ACID 1 MG PO TABS: 1.0000 mg | ORAL_TABLET | Freq: Every day | ORAL | Status: DC

## 2020-02-26 MED ORDER — GABAPENTIN 600 MG PO TABS: 300.0000 mg | ORAL_TABLET | Freq: Every day | ORAL | Status: DC

## 2020-02-26 MED ORDER — PRAZOSIN HCL 2 MG PO CAPS: 2.0000 mg | ORAL_CAPSULE | Freq: Every day | ORAL | Status: DC

## 2020-02-26 MED ORDER — TRAZODONE HCL 100 MG PO TABS: 100.0000 mg | ORAL_TABLET | Freq: Every day | ORAL | Status: DC

## 2020-02-26 MED ORDER — ADULT MULTIVITAMIN W/MINERALS CH: 1.0000 | ORAL_TABLET | Freq: Every day | ORAL | Status: DC

## 2020-02-26 MED ORDER — HYDRALAZINE HCL 50 MG PO TABS: 50.0000 mg | ORAL_TABLET | Freq: Two times a day (BID) | ORAL | Status: DC

## 2020-02-26 MED ORDER — ENSURE ENLIVE PO LIQD: 237.0000 mL | Freq: Two times a day (BID) | ORAL | Status: DC

## 2020-02-26 MED ORDER — THIAMINE HCL 100 MG PO TABS: 100.0000 mg | ORAL_TABLET | Freq: Every day | ORAL | Status: DC

## 2020-02-26 NOTE — Progress Notes (Signed)
Patient belongings found in her bag at the bedside. 2 pairs silver earrings,  2 silver rings, and 2 silver necklaces with pendant (silver ring). Pt inquiring about home meds brought to the hospital upon admission. She also states she was confused upon admission so she is not sure where to locate her meds. I check pt chart and pharmacy no meds logged. Charge nurse notified.

## 2020-02-26 NOTE — Telephone Encounter (Signed)
Rescheduled

## 2020-02-26 NOTE — TOC Transition Note (Signed)
Transition of Care [facility]) - CM/SW Discharge Note   Patient Details  Name: Tiffany [name] MRN: [id number] Date of Birth: 02/20/1960  Transition of Care [facility]) CM/SW Contact:  Dessa Phi, RN Phone Number: 02/26/2020, 1:26 PM   Clinical Narrative: [name] has received d/c summary-going to rm#[id number],nsg tel# for report tel#[phone].PTAR called-Nsg aware. Packet @ nsg station. No further CM needs.      Final next level of care: Skilled Nursing Facility Barriers to Discharge: No Barriers Identified   Patient Goals and CMS Choice Patient states their goals for this hospitalization and ongoing recovery are:: go to rehab CMS Medicare.gov Compare Post Acute Care list provided to:: Patient    Discharge Placement                       Discharge Plan and Services                                     Social Determinants of Health (SDOH) Interventions     Readmission Risk Interventions Readmission Risk Prevention Plan 02/23/2020  Transportation Screening Complete  Medication Review ([place]) Complete  PCP or Specialist appointment within 3-5 days of discharge Complete  HRI or Home Care Consult Complete  SW Recovery Care/Counseling Consult Complete  Palliative Care Screening Complete  Skilled Nursing Facility Complete  Some recent data might be hidden

## 2020-02-26 NOTE — Plan of Care (Signed)
  Problem: Clinical Measurements: Goal: Will remain free from infection Outcome: Progressing Goal: Respiratory complications will improve Outcome: Progressing   Problem: Safety: Goal: Ability to remain free from injury will improve Outcome: Progressing   Problem: Activity: Goal: Risk for activity intolerance will decrease Outcome: Adequate for Discharge   Problem: Nutrition: Goal: Adequate nutrition will be maintained Outcome: Completed/Met

## 2020-02-26 NOTE — Telephone Encounter (Signed)
Pt called stating Dr.Hilts referred her for an injection and she would like to go ahead and get that scheduled please; pt also mentioned when she is called she would like to have everything regarding the injection explained please.  [phone]

## 2020-02-26 NOTE — Progress Notes (Signed)
Patient stating that she is missing her keys, 2 silver earrings, 2 silver necklaces and her wallet. Pt has belonging bag at bedside and is refusing to let staff look in bag to verify missing belongings are not present. Pt states she came in via EMS and her belongings were removed in the ED. Security called and no items logged in under patient. ED called and are to look for missing items.

## 2020-02-26 NOTE — Discharge Summary (Signed)
Physician Discharge Summary  Tiffany [name] HAF:[id number] DOB: [birth year]  PCP: Tiffany Rakes, MD  Admitted from: Home Discharged to: SNF  Admit date: 02/18/2020 Discharge date: 02/26/2020  Recommendations for Outpatient Follow-up:    Follow-up Information    Tiffany Rakes, MD. Schedule an appointment as soon as possible for a visit.   Specialty: Family Medicine Why: To be seen upon discharge from SNF. Contact information: [place] [address] [phone]        Tiffany Margarita, MD .   Specialty: Cardiology Contact information: [phone] [place] [address] [phone]        MD at SNF Follow up.   Why: To be seen in 2 to 3 days with repeat labs (CBC & BMP).  Periodically monitor EKG for QTC prolongation.       Psychiatry. Schedule an appointment as soon as possible for a visit in 1 week(s).   Why: Patient reportedly follows with outpatient behavioral health services at [facility].  As per inpatient psychiatry team, patient has a follow-up appointment on 03/10/2020.  Kindly ensure that she follows up with outpatient psychiatry.       Pucilowski, Marchia Bond, MD Follow up on 03/10/2020.   Specialty: Psychiatry Contact information: [address] [place] [address] [phone]                Home Health: N/A    Equipment/Devices: TBD at SNF.    Discharge Condition: Improved and stable   Code Status: Full Code Diet recommendation:  Discharge Diet Orders (From admission, onward)    Start     Ordered   02/26/20 0000  Diet - low sodium heart healthy        02/26/20 1157           Discharge Diagnoses:  Principal Problem:   ARF (acute renal failure) (HCC) Active Problems:   Type 2 diabetes mellitus (HCC)   Dyslipidemia   COPD (chronic obstructive pulmonary disease) (HCC)   Tobacco use disorder   Hyponatremia   Essential hypertension   Hypokalemia   CKD (chronic kidney disease)  stage 3, GFR 30-59 ml/min (HCC)   Hypomagnesemia   Gout   Drug abuse ([place])   Schizoaffective disorder, bipolar type (HCC)   E. coli UTI   Acute metabolic encephalopathy   Psychosis (HCC)   Schizophrenia, schizo-affective ([place])   Metabolic acidosis   Hypocalcemia   Brief Summary: Tiffany [name] is a 60 y/o woman with a history of schizoaffective disorder who was brought to the hospital for abnormal behavior due to need for inpatient psychiatric treatment. Per ED records she has been not eating for several days at home and has been barricading herself inside. She has not been taking her psychiatric medications. IVC initiated by APS. Her initial labs were significantly abnormal with AKI, hypokalemia, hypomagnesemia, incompletely compensated metabolic acidosis, hypoglycemia. She was evaluated in the ED and started on aggressive IVF resuscitation, and electrolyte repletion. Patient on admission was unable to provide much history. Patient was brought in an IVC for hallucinations, paranoid behavior.  Patient admitted to the critical care team, nephrology consulted due to acute renal failure,PANDA tube placed for feeding. Patient's electrolytes repleted. Patient transferred to [facility] hospitalist team 02/20/2020.    Assessment & Plan:   1 acute renal failure on chronic kidney disease stage IIIb (baseline creatinine 1.2-1.5) Likely secondary to prerenal azotemia in the setting of poor oral intake, NSAID use. Creatinine on admission was  6.26. Urinalysis done with large leukocytes, nitrite negative, 100 protein, 21-50 WBCs. Urine sodium noted at 17. Urine creatinine at 300.84. Urine osmolality of 300. Renal ultrasound done was negative for hydronephrosis, showed increased echogenicity of bilateral kidneys likely reflecting medical renal disease, large cystic structure superior to the bladder not significant change in comparison to prior imaging. Patient placed on IV fluids.  Acute kidney injury resolved.     Nephrology signed off.  IV fluids were discontinued.  Creatinine on day of discharge 1.2, at her baseline.  NSAIDs on her home med list which she probably was not taking were discontinued.  Encourage ad lib. oral fluid intake.  Periodically monitor BMP as outpatient.  2. Hypokalemia/hypomagnesemia On day of discharge, potassium 4.3.  Magnesium 1.7.  Patient will be given an additional dose of IV magnesium 4 g prior to DC.  Periodically follow labs as outpatient.  3. Hyperphosphatemia>>>> hypophosphatemia Resolved.  4. Acute metabolic acidosis/high anion gap Likely secondary to problem #1 and probable starvation ketosis. Lactate normal. UDS was positive for benzodiazepines and amphetamines. Salicylate levels normal.  Patient was initially on bicarb drip.  Acidosis resolved.  5. Hypotension/hypertension Resolved.  Reasonably controlled with meds listed below.  6.  E. coli UTI Urine cultures with > 100,000 colonies of E. coli resistant to ampicillin and Bactrim, sensitive to the cephalosporins, Cipro, gent.. Transitioned from IV Rocephin to IV cefazolin to complete a 5-day course of treatment.  Completed course of antibiotics.  7. Hypocalcemia Resolved.  8. Acute metabolic encephalopathy Likely secondary to acute kidney injury, UTI, dehydration in the setting of schizoaffective disorder.  Underlying causes were treated.  Psychiatry followed and adjusting psychiatric medications.  Clinically improved and suspect that her mental status is back to her baseline.  Patient declines offer to speak with her family and she does have capacity to make medical decisions.  9. Schizoaffective disorder Patient noted to have a history of schizoaffective disorder with psychosis on presentation noted to have barricaded herself inside the house and not eating. Patient stated she felt her son was going to hurt her and still believes he is trying to hurt her. Patient denied any visual hallucinations, denied  any auditory hallucinations.  Was involuntarily committed.  Psychiatry follow-up 2/9 appreciated and they indicate that patient has mental capacity to make medical decisions, no longer require sitter nor inpatient psychiatric care.  She reportedly has chronic delusions related to her schizophrenia.    Safety sitter and IVC were discontinued on 02/25/2020.  Psychiatry did not see need for any inpatient psychiatric care and medically cleared her.  Patient was on polypharmacy psychiatric medications PTA but it appears that she may not have been taking them.  They resumed some of her medications and also at reduced doses.  They indicate that she had a follow-up appointment with outpatient psychiatry yesterday that she missed because she was hospitalized and reportedly has a next appointment on 03/10/2020 which she is advised to keep.  Her medications can be reviewed during that visit and adjusted as needed.  Reviewed PDMP and noted that patient last filled Valium 10 mg, 90 tablets / 30-day supply on 02/04/2020.  Since patient is being discharged to SNF, provided 3-day supply and rest can be filled by MD at [facility].  EKG 02/26/2020: QTC 454 ms.  10.  Well-controlled diabetes mellitus type 2 with hypoglycemia Patient noted to be hypoglycemic on presentation likely secondary to poor oral intake.  CBG controlled.  SSI discontinued.  A1c 4.8.  11. Anemia/folate deficiency  Likely secondary to malnutrition. Patient with no overt bleeding. Anemia panel with a folate deficiency and also consistent with anemia of chronic disease.  Continue folic acid 1 mg daily and PTA iron supplements.  Hemoglobin stable.  12. Leukopenia Resolved.  13. Hyponatremia Likely secondary to hypovolemic hyponatremia.  Resolved with hydration.   14. Malnutrition Patient with history of significant weight loss. Patient with risk of refeeding syndrome.  Was on Panda tube feeds earlier, patient pulled out Panda tube.  Tolerating diet.  Large  cystic structure superior to the bladder: Noted on renal ultrasound.  Reportedly stable compared to prior imaging.  Radiology recommended gynecology referral.  It appears that patient has an outpatient GYN follow-up appointment and this needs to be adhered to, for further evaluation and manage.  Etiology unclear.     Consultants:   Psychiatry: Sheran Fava, [facility] 02/20/2020  Nephrology: Dr. Marval Regal 02/18/2020  PCCM admission  Procedures:   Chest x-ray 02/18/2020  Abdominal films 02/18/2020  Renal ultrasound 02/18/2020   Discharge Instructions  Discharge Instructions    Call MD for:   Complete by: As directed    Altered mental status.   Call MD for:  difficulty breathing, headache or visual disturbances   Complete by: As directed    Call MD for:  extreme fatigue   Complete by: As directed    Call MD for:  persistant dizziness or light-headedness   Complete by: As directed    Call MD for:  persistant nausea and vomiting   Complete by: As directed    Call MD for:  severe uncontrolled pain   Complete by: As directed    Call MD for:  temperature >100.4   Complete by: As directed    Diet - low sodium heart healthy   Complete by: As directed    Increase activity slowly   Complete by: As directed    No wound care   Complete by: As directed        Medication List    STOP taking these medications   atorvastatin 80 MG tablet Commonly known as: LIPITOR   diclofenac 75 MG EC tablet Commonly known as: VOLTAREN   diclofenac Sodium 1 % Gel Commonly known as: VOLTAREN   escitalopram 20 MG tablet Commonly known as: Lexapro   lidocaine 5 % Commonly known as: Lidoderm   oxybutynin 5 MG tablet Commonly known as: DITROPAN   potassium chloride SA 20 MEQ tablet Commonly known as: KLOR-CON   Rexulti 1 MG Tabs tablet Generic drug: brexpiprazole   saccharomyces boulardii 250 MG capsule Commonly known as: Florastor   tiZANidine 4 MG tablet Commonly known as:  Zanaflex     TAKE these medications   albuterol (2.5 MG/3ML) 0.083% nebulizer solution Commonly known as: PROVENTIL Take 3 mLs (2.5 mg total) by nebulization every 6 (six) hours as needed for wheezing or shortness of breath.   albuterol 108 (90 Base) MCG/ACT inhaler Commonly known as: VENTOLIN HFA Inhale 2 puffs into the lungs every 6 (six) hours as needed for wheezing or shortness of breath.   amLODipine 10 MG tablet Commonly known as: NORVASC Take 1 tablet (10 mg total) by mouth daily. Start taking on: February 27, 2020   Blood Pressure Cuff Misc 1 Units by Does not apply route daily.   colchicine 0.6 MG tablet Take 1 tablet (0.6 mg total) by mouth 2 (two) times daily.   diazepam 10 MG tablet Commonly known as: VALIUM Take 1 tablet (10 mg total) by mouth  3 (three) times daily as needed for anxiety. What changed: Another medication with the same name was removed. Continue taking this medication, and follow the directions you see here.   febuxostat 40 MG tablet Commonly known as: ULORIC Take 1 tablet (40 mg total) by mouth daily.   feeding supplement Liqd Take 237 mLs by mouth 2 (two) times daily between meals.   ferrous sulfate 325 (65 FE) MG tablet Take 1 tablet (325 mg total) by mouth daily with breakfast.   folic acid 1 MG tablet Commonly known as: FOLVITE Take 1 tablet (1 mg total) by mouth daily. Start taking on: February 27, 2020   gabapentin 600 MG tablet Commonly known as: NEURONTIN Take 0.5 tablets (300 mg total) by mouth daily. What changed:   how much to take  when to take this   hydrALAZINE 50 MG tablet Commonly known as: APRESOLINE Take 1 tablet (50 mg total) by mouth 2 (two) times daily.   lisdexamfetamine 20 MG capsule Commonly known as: Vyvanse Take 1 capsule (20 mg total) by mouth daily with breakfast. What changed:   medication strength  how much to take  when to take this   Medical Compression Stockings Misc 1 Units by Does not  apply route daily.   montelukast 10 MG tablet Commonly known as: SINGULAIR Take 1 tablet (10 mg total) by mouth at bedtime.   multivitamin with minerals Tabs tablet Take 1 tablet by mouth daily. Start taking on: February 27, 2020   omeprazole 40 MG capsule Commonly known as: PRILOSEC Take 1 capsule (40 mg total) by mouth daily.   prazosin 2 MG capsule Commonly known as: MINIPRESS Take 1 capsule (2 mg total) by mouth at bedtime. What changed:   medication strength  how much to take  when to take this   Saphris 10 MG Subl Generic drug: Asenapine Maleate Place 1 tablet (10 mg total) under the tongue in the morning, at noon, and at bedtime.   thiamine 100 MG tablet Take 1 tablet (100 mg total) by mouth daily. Start taking on: February 27, 2020   traZODone 100 MG tablet Commonly known as: DESYREL Take 1 tablet (100 mg total) by mouth at bedtime. What changed:   how much to take  how to take this  when to take this  additional instructions   Trelegy Ellipta 100-62.5-25 MCG/INH Aepb Generic drug: Fluticasone-Umeclidin-Vilant Inhale 1 puff into the lungs daily.      Allergies  Allergen Reactions  . Hydrocodone Shortness Of Breath  . Hydrocodone-Acetaminophen Shortness Of Breath  . Hydroxyzine Anaphylaxis and Shortness Of Breath  . Latuda [Lurasidone Hcl] Anaphylaxis  . Lurasidone Anaphylaxis  . Magnesium-Containing Compounds Anaphylaxis  . Prednisone Anaphylaxis, Swelling and Other (See Comments)    Tongue swelling  . Tramadol Anaphylaxis and Swelling  . Codeine Nausea And Vomiting  . Other Rash  . Sulfa Antibiotics Itching  . Tape Rash      Procedures/Studies: DG Abd 1 View  Result Date: 02/18/2020 CLINICAL DATA:  Feeding tube placement. EXAM: ABDOMEN - 1 VIEW COMPARISON:  None. FINDINGS: Tip of the weighted enteric tube below the diaphragm in the left upper quadrant in the region of the proximal stomach. Tip of the tube is not post pyloric. Slight  gaseous distension of transverse colon, no evidence of bowel obstruction. IMPRESSION: Tip of the weighted enteric tube below the diaphragm in the left upper quadrant in the region of the proximal stomach. Electronically Signed   By: Aurther Loft.D.  On: 02/18/2020 20:39   US Renal  Result Date: 02/18/2020 CLINICAL DATA:  AK I EXAM: RENAL / URINARY TRACT ULTRASOUND COMPLETE COMPARISON:  CT abdomen pelvis October 30, 2019 and renal ultrasound October 27, 2019 FINDINGS: Right Kidney: Renal measurements: 9.1 x 4.7 x 4.2 cm = volume: 93 mL. Increased echogenicity. No mass or hydronephrosis visualized. Left Kidney: Renal measurements: 8.9 x 4.6 x 4.5 cm = volume: 97 mL. Increased echogenicity. No mass or hydronephrosis visualized. Bladder: Appears normal for degree of bladder distention. Other: Large cystic structure superior to the bladder which measures 8.0 x 7.3 x 6.2 cm not significant changed in comparison to prior imaging. IMPRESSION: 1. No hydronephrosis. 2. Increased echogenicity of the bilateral kidneys likely reflecting medical renal disease. 3. Large cystic structure superior to the bladder, not significant changed in comparison to prior imaging. Recommend gynecologic referral if not previously performed. Electronically Signed   By: Dahlia Bailiff MD   On: 02/18/2020 17:12   DG Chest Portable 1 View  Result Date: 02/18/2020 CLINICAL DATA:  60 year old female with concern for infection. EXAM: PORTABLE CHEST 1 VIEW COMPARISON:  Chest radiograph dated 10/27/2019. FINDINGS: No focal consolidation, pleural effusion or pneumothorax. Mild cardiomegaly. No acute osseous pathology. Degenerative changes of the spine. IMPRESSION: No active disease. Electronically Signed   By: Anner Crete M.D.   On: 02/18/2020 15:23   DG Abd 2 Views  Result Date: 02/21/2020 CLINICAL DATA:  Abdominal pain. EXAM: ABDOMEN - 2 VIEW COMPARISON:  February 18, 2020. FINDINGS: The bowel gas pattern is normal. There is no  evidence of free air. Distal tip of feeding tube is seen in proximal stomach. No radio-opaque calculi or other significant radiographic abnormality is seen. IMPRESSION: Distal tip of feeding tube seen in proximal stomach. Electronically Signed   By: Marijo Conception M.D.   On: 02/21/2020 12:47      Subjective: Patient denies complaints.  Reports that she was seeing an outpatient physician for "injection to the back" for back pain.  Currently does not report any back pain and has not used much pain meds at all except a dose or 2 of Tylenol.  No audiovisual hallucinations, suicidal or homicidal ideations.  As per nursing, no acute issues reported.  Discharge Exam:  Vitals:   02/25/20 2052 02/26/20 0454 02/26/20 0500 02/26/20 1213  BP: 100/88 134/72  119/81  Pulse: 80 89  86  Resp: 18 16  18   Temp: 98.9 F (37.2 C) 98.5 F (36.9 C)  97.8 F (36.6 C)  TempSrc: Oral Axillary  Oral  SpO2: 96% 96%  100%  Weight:   68.3 kg   Height:        General exam: Middle-age female, looks older than stated age, moderately built, thinly nourished, sitting up comfortably in bed. Respiratory system: Clear to auscultation.  No increased work of breathing. Cardiovascular system: RRR no murmurs rubs or gallops.  No JVD.  No lower extremity edema.  Not on telemetry. Gastrointestinal system: Abdomen is soft, nontender, nondistended, positive bowel sounds.  No rebound.  No guarding.  Central nervous system: Alert and oriented x3. No focal neurological deficits. Extremities: Symmetric 5 x 5 power. Skin: No rashes, lesions or ulcers Psychiatry: Judgement and insight improved. Mood & affect pleasant and appropriate     The results of significant diagnostics from this hospitalization (including imaging, microbiology, ancillary and laboratory) are listed below for reference.     Microbiology: Recent Results (from the past 240 hour(s))  Resp Panel by RT-PCR (  Flu A&B, Covid) Nasopharyngeal Swab     Status:  None   Collection Time: 02/18/20  1:44 PM   Specimen: Nasopharyngeal Swab; Nasopharyngeal(NP) swabs in vial transport medium  Result Value Ref Range Status   SARS Coronavirus 2 by RT PCR NEGATIVE NEGATIVE Final    Comment: (NOTE) SARS-CoV-2 target nucleic acids are NOT DETECTED.  The SARS-CoV-2 RNA is generally detectable in upper respiratory specimens during the acute phase of infection. The lowest concentration of SARS-CoV-2 viral copies this assay can detect is 138 copies/mL. A negative result does not preclude SARS-Cov-2 infection and should not be used as the sole basis for treatment or other patient management decisions. A negative result may occur with  improper specimen collection/handling, submission of specimen other than nasopharyngeal swab, presence of viral mutation(s) within the areas targeted by this assay, and inadequate number of viral copies(<138 copies/mL). A negative result must be combined with clinical observations, patient history, and epidemiological information. The expected result is Negative.  Fact Sheet for Patients:  EntrepreneurPulse.com.au  Fact Sheet for Healthcare Providers:  IncredibleEmployment.be  This test is no t yet approved or cleared by the [place] FDA and  has been authorized for detection and/or diagnosis of SARS-CoV-2 by FDA under an Emergency Use Authorization (EUA). This EUA will remain  in effect (meaning this test can be used) for the duration of the COVID-19 declaration under Section 564(b)(1) of the Act, 21 U.S.C.section 360bbb-3(b)(1), unless the authorization is terminated  or revoked sooner.       Influenza A by PCR NEGATIVE NEGATIVE Final   Influenza B by PCR NEGATIVE NEGATIVE Final    Comment: (NOTE) The Xpert Xpress SARS-CoV-2/FLU/RSV plus assay is intended as an aid in the diagnosis of influenza from Nasopharyngeal swab specimens and should not be used as a sole basis for  treatment. Nasal washings and aspirates are unacceptable for Xpert Xpress SARS-CoV-2/FLU/RSV testing.  Fact Sheet for Patients: EntrepreneurPulse.com.au  Fact Sheet for Healthcare Providers: IncredibleEmployment.be  This test is not yet approved or cleared by the [place] FDA and has been authorized for detection and/or diagnosis of SARS-CoV-2 by FDA under an Emergency Use Authorization (EUA). This EUA will remain in effect (meaning this test can be used) for the duration of the COVID-19 declaration under Section 564(b)(1) of the Act, 21 U.S.C. section 360bbb-3(b)(1), unless the authorization is terminated or revoked.  Performed at [facility], [place] [address], [place], [place] [address]   MRSA PCR Screening     Status: None   Collection Time: 02/18/20  6:58 PM   Specimen: Nasopharyngeal  Result Value Ref Range Status   MRSA by PCR NEGATIVE NEGATIVE Final    Comment:        The GeneXpert MRSA Assay (FDA approved for NASAL specimens only), is one component of a comprehensive MRSA colonization surveillance program. It is not intended to diagnose MRSA infection nor to guide or monitor treatment for MRSA infections. Performed at [facility], [place] [address] [place], [place] [address]   Urine culture     Status: Abnormal   Collection Time: 02/19/20  2:00 AM   Specimen: Urine, Clean Catch  Result Value Ref Range Status   Specimen Description   Final    URINE, CLEAN CATCH Performed at [facility], [place] [address], [place], [place] [address]    Special Requests   Final    NONE Performed at [facility], [place] [address], [place],  [address]  Culture >=100,000 COLONIES/mL ESCHERICHIA COLI (A)  Final   Report Status 02/21/2020 FINAL  Final   Organism ID, Bacteria ESCHERICHIA COLI (A)  Final      Susceptibility   Escherichia coli - MIC*    AMPICILLIN  >=32 RESISTANT Resistant     CEFAZOLIN <=4 SENSITIVE Sensitive     CEFEPIME <=0.12 SENSITIVE Sensitive     CEFTRIAXONE <=0.25 SENSITIVE Sensitive     CIPROFLOXACIN 1 SENSITIVE Sensitive     GENTAMICIN <=1 SENSITIVE Sensitive     IMIPENEM <=0.25 SENSITIVE Sensitive     NITROFURANTOIN <=16 SENSITIVE Sensitive     TRIMETH/SULFA >=320 RESISTANT Resistant     AMPICILLIN/SULBACTAM 16 INTERMEDIATE Intermediate     PIP/TAZO <=4 SENSITIVE Sensitive     * >=100,000 COLONIES/mL ESCHERICHIA COLI  SARS CORONAVIRUS 2 (TAT 6-24 HRS) Nasopharyngeal Nasopharyngeal Swab     Status: None   Collection Time: 02/24/20  1:49 PM   Specimen: Nasopharyngeal Swab  Result Value Ref Range Status   SARS Coronavirus 2 NEGATIVE NEGATIVE Final    Comment: (NOTE) SARS-CoV-2 target nucleic acids are NOT DETECTED.  The SARS-CoV-2 RNA is generally detectable in upper and lower respiratory specimens during the acute phase of infection. Negative results do not preclude SARS-CoV-2 infection, do not rule out co-infections with other pathogens, and should not be used as the sole basis for treatment or other patient management decisions. Negative results must be combined with clinical observations, patient history, and epidemiological information. The expected result is Negative.  Fact Sheet for Patients: SugarRoll.be  Fact Sheet for Healthcare Providers: https://www.woods-mathews.com/  This test is not yet approved or cleared by the [place] FDA and  has been authorized for detection and/or diagnosis of SARS-CoV-2 by FDA under an Emergency Use Authorization (EUA). This EUA will remain  in effect (meaning this test can be used) for the duration of the COVID-19 declaration under Se ction 564(b)(1) of the Act, 21 U.S.C. section 360bbb-3(b)(1), unless the authorization is terminated or revoked sooner.  Performed at [facility], [place] [address] [place],  [place] [address]      Labs: CBC: Recent Labs  Lab 02/20/20 0258 02/21/20 0253 02/22/20 0554 02/23/20 0456 02/23/20 1806 02/25/20 0542  WBC 5.3 3.7* 4.8 5.7  --  8.6  NEUTROABS  --  2.1 2.4 2.7  --  4.0  HGB 8.7* 8.4* 9.0* 7.7* 10.0* 8.4*  HCT 26.7* 26.4* 28.7* 24.2* 32.6* 26.5*  MCV 87.5 87.7 88.9 88.3  --  90.4  PLT 335 297 300 272  --  902    Basic Metabolic Panel: Recent Labs  Lab 02/21/20 0253 02/22/20 0554 02/23/20 0456 02/25/20 0542 02/26/20 0533  NA 137 140 137 141 140  K 3.1* 3.6 4.0 4.4 4.3  CL 107 106 104 104 104  CO2 22 23 24 28 28   GLUCOSE 106* 91 97 86 84  BUN 53* 34* 27* 18 16  CREATININE 2.79* 1.50* 1.18* 1.15* 1.21*  CALCIUM 8.4* 8.7* 8.4* 8.5* 8.8*  MG 1.6* 1.9 1.5* 1.6* 1.7  PHOS 1.2* 2.6 2.8 3.6 3.8    Liver Function Tests: Recent Labs  Lab 02/21/20 0253 02/22/20 0554 02/23/20 0456 02/25/20 0542 02/26/20 0533  ALBUMIN 2.3* 2.4* 2.3* 2.6* 2.5*    CBG: Recent Labs  Lab 02/25/20 0355 02/25/20 0730 02/25/20 2001 02/26/20 0727 02/26/20 1116  GLUCAP 82 89 95 71 73    Urinalysis    Component Value Date/Time   COLORURINE AMBER (A) 02/19/2020 0200  APPEARANCEUR CLOUDY (A) 02/19/2020 0200   LABSPEC 1.015 02/19/2020 0200   PHURINE 5.0 02/19/2020 0200   GLUCOSEU NEGATIVE 02/19/2020 0200   HGBUR SMALL (A) 02/19/2020 0200   BILIRUBINUR NEGATIVE 02/19/2020 0200   BILIRUBINUR NEG 05/21/2014 1155   KETONESUR 5 (A) 02/19/2020 0200   PROTEINUR 100 (A) 02/19/2020 0200   UROBILINOGEN 0.2 09/30/2019 1557   NITRITE NEGATIVE 02/19/2020 0200   LEUKOCYTESUR LARGE (A) 02/19/2020 0200    Patient declined this MDs offer to call her family/friends to update her hospital care and course.  Time coordinating discharge: 40 minutes  SIGNED:  Vernell Leep, MD, [place], [facility]. [facility]  To contact the attending provider between 7A-7P or the covering provider during after hours 7P-7A, please log into the web site [email or web address] and access using  universal [place] password for that web site. If you do not have the password, please call the hospital operator.

## 2020-02-26 NOTE — Plan of Care (Signed)
  Problem: Education: Goal: Knowledge of General Education information will improve Description: Including pain rating scale, medication(s)/side effects and non-pharmacologic comfort measures Outcome: Adequate for Discharge   Problem: Health Behavior/Discharge Planning: Goal: Ability to manage health-related needs will improve Outcome: Adequate for Discharge   Problem: Clinical Measurements: Goal: Ability to maintain clinical measurements within normal limits will improve Outcome: Adequate for Discharge Goal: Will remain free from infection 02/26/2020 1710 by Lennie Hummer, RN Outcome: Adequate for Discharge 02/26/2020 1049 by Lennie Hummer, RN Outcome: Progressing Goal: Diagnostic test results will improve Outcome: Adequate for Discharge Goal: Respiratory complications will improve 02/26/2020 1710 by Lennie Hummer, RN Outcome: Adequate for Discharge 02/26/2020 1049 by Lennie Hummer, RN Outcome: Progressing Goal: Cardiovascular complication will be avoided Outcome: Adequate for Discharge   Problem: Activity: Goal: Risk for activity intolerance will decrease 02/26/2020 1710 by Lennie Hummer, RN Outcome: Adequate for Discharge 02/26/2020 1049 by Lennie Hummer, RN Outcome: Adequate for Discharge   Problem: Coping: Goal: Level of anxiety will decrease Outcome: Adequate for Discharge   Problem: Elimination: Goal: Will not experience complications related to bowel motility Outcome: Adequate for Discharge Goal: Will not experience complications related to urinary retention Outcome: Adequate for Discharge   Problem: Pain Managment: Goal: General experience of comfort will improve Outcome: Adequate for Discharge   Problem: Safety: Goal: Ability to remain free from injury will improve 02/26/2020 1710 by Lennie Hummer, RN Outcome: Adequate for Discharge 02/26/2020 1049 by Lennie Hummer, RN Outcome: Progressing   Problem: Skin Integrity: Goal: Risk for impaired  skin integrity will decrease Outcome: Adequate for Discharge   Problem: Education: Goal: Ability to describe self-care measures that may prevent or decrease complications (Diabetes Survival Skills Education) will improve Outcome: Adequate for Discharge Goal: Individualized Educational Video(s) Outcome: Adequate for Discharge   Problem: Metabolic: Goal: Ability to maintain appropriate glucose levels will improve Outcome: Adequate for Discharge   Problem: Nutritional: Goal: Maintenance of adequate nutrition will improve Outcome: Adequate for Discharge Goal: Progress toward achieving an optimal weight will improve Outcome: Adequate for Discharge   Problem: Skin Integrity: Goal: Risk for impaired skin integrity will decrease Outcome: Adequate for Discharge

## 2020-02-26 NOTE — Discharge Instructions (Signed)

## 2020-02-26 NOTE — TOC Progression Note (Addendum)
Transition of Care [facility]) - Progression Note    Patient Details  Name: Tiffany [name] MRN: [id number] Date of Birth: 25-Jun-1960  [facility] CM/SW Contact  Travanti Mcmanus, Juliann Pulse, RN Phone Number: 02/26/2020, 9:23 AM  Clinical Narrative: [facility] has accepted. CM initiating auth-auth waived-ref#[id number] 2/8 neg. If stable can d/c with dc summary.    Expected Discharge Plan: Skilled Nursing Facility Barriers to Discharge: Insurance Authorization  Expected Discharge Plan and Services Expected Discharge Plan: [place]       Living arrangements for the past 2 months: Apartment                                       Social Determinants of Health (SDOH) Interventions    Readmission Risk Interventions Readmission Risk Prevention Plan 02/23/2020  Transportation Screening Complete  Medication Review [employer]) Complete  PCP or Specialist appointment within 3-5 days of discharge Complete  HRI or Home Care Consult Complete  SW Recovery Care/Counseling Consult Complete  Palliative Care Screening Complete  Skilled Nursing Facility Complete  Some recent data might be hidden

## 2020-02-26 NOTE — Telephone Encounter (Signed)
Void Patient is going to call back

## 2020-02-27 ENCOUNTER — Ambulatory Visit: Payer: Medicaid Other | Admitting: Obstetrics & Gynecology

## 2020-03-02 ENCOUNTER — Ambulatory Visit (INDEPENDENT_AMBULATORY_CARE_PROVIDER_SITE_OTHER): Payer: 59 | Admitting: Psychiatry

## 2020-03-02 ENCOUNTER — Other Ambulatory Visit: Payer: Self-pay

## 2020-03-02 DIAGNOSIS — F200 Paranoid schizophrenia: Secondary | ICD-10-CM

## 2020-03-02 DIAGNOSIS — F250 Schizoaffective disorder, bipolar type: Secondary | ICD-10-CM | POA: Diagnosis not present

## 2020-03-02 NOTE — Progress Notes (Signed)
Virtual Visit via Video Note  I connected with WARDA MCQUEARY on 03/02/20 at  4:30 PM EST by a video enabled telemedicine application and verified that I am speaking with the correct person using two identifiers.  Location: Patient: Patient Home Provider: Home Office  I discussed the limitations of evaluation and management by telemedicine and the availability of in person appointments. The patient expressed understanding and agreed to proceed.  History of Present Illness: Schizoeffective DO Bipolar Type and Chronic PTSD  Treatment Plan Goals: Goal 1: Client would like to create and maintain a more stable living environment in order to better manage mental health care and routine with medication management and therapy participation to reduce anxiety and depression.  Goal 2: Client would like to process grief/loss issues and trauma experiences to heal from past to move forward in building healthy relationships with others.  Observations/Objective: Counselor met withClientfor individual therapy viatelephone. Counselor assessed MH symptoms and progress on treatment plan goals, with patient updating Counselor on recent hospital stay, supportive community services and plan for moving forward in treatment. Clientpresents withmoderatedepression andmoderateanxiety.Clientdenied suicidal ideation or self-harm behaviors.  Goal 1: Counselor assessed daily functioning and housing stability.Client reports she is taking medication as prescribed 7 out of 7 days. Client reports that she experienced a mental and physical health crisis last week, causing her to be hospitalized. Counselor processed thoughts, feelings and behaviors associated with the onset of symptoms and treatment provided. Counselor and Client reviewed safety plan to identify ways to improve efficiency. Client presents as alarmed and confused as to how she was functioning leading up to medical care, desiring to process more with  her in home therapist who intervened on her behalf. Client discussed distrust with some of the processes and providers interacted with over the past week. Client plans to follow up with community providers and psychiatrist before next session.   Assessment and Plan: Patientwill continue to keep the office updated on her current contact information.Patient will continue to follow recommendations of providers and implement skills learned in session.  Follow Up Instructions: Counselor willcommunicate needs with new provider.  The patient was advised to call back or seek an in-person evaluation if the symptoms worsen or if the condition fails to improve as anticipated.  I provided41mnutes of non-face-to-face time during this encounter.   BLise Auer LCSW

## 2020-03-04 ENCOUNTER — Telehealth: Payer: Self-pay | Admitting: Family Medicine

## 2020-03-04 NOTE — Telephone Encounter (Signed)
Copied from [place] [id number]. Topic: Appointment Scheduling - Scheduling Inquiry for Clinic >> Mar 03, 2020  4:58 PM Tessa Lerner A wrote: Reason for CRM: Patient would like to be contacted to discuss coming into the office for a hospital follow up  The earliest available appointment at the time of call with agent was for 04/05/20, patient declined appt at the time of call  Please contact to advise scheduling   Called patient and call could not be completed. Patient has an upcoming appointment with Dr. Margarita Rana on March the 14th. Unfortunately, we have nothing available sooner than her current appointment. If patient calls back and something sooner is available she can move up her appointment date.

## 2020-03-05 ENCOUNTER — Encounter (HOSPITAL_COMMUNITY): Payer: Self-pay | Admitting: Psychiatry

## 2020-03-05 ENCOUNTER — Other Ambulatory Visit: Payer: Self-pay

## 2020-03-05 ENCOUNTER — Telehealth (INDEPENDENT_AMBULATORY_CARE_PROVIDER_SITE_OTHER): Payer: 59 | Admitting: Psychiatry

## 2020-03-05 DIAGNOSIS — F250 Schizoaffective disorder, bipolar type: Secondary | ICD-10-CM | POA: Diagnosis not present

## 2020-03-05 DIAGNOSIS — F4310 Post-traumatic stress disorder, unspecified: Secondary | ICD-10-CM | POA: Diagnosis not present

## 2020-03-05 MED ORDER — DIAZEPAM 10 MG PO TABS: 10.0000 mg | ORAL_TABLET | Freq: Three times a day (TID) | ORAL | 2 refills | Status: DC | PRN

## 2020-03-05 MED ORDER — PRAZOSIN HCL 2 MG PO CAPS: 2.0000 mg | ORAL_CAPSULE | Freq: Every day | ORAL | 0 refills | Status: DC

## 2020-03-05 MED ORDER — SAPHRIS 10 MG SL SUBL: 10.0000 mg | SUBLINGUAL_TABLET | Freq: Three times a day (TID) | SUBLINGUAL | 2 refills | Status: DC

## 2020-03-05 MED ORDER — ESCITALOPRAM OXALATE 20 MG PO TABS: 20.0000 mg | ORAL_TABLET | Freq: Every day | ORAL | 2 refills | Status: DC

## 2020-03-05 MED ORDER — LISDEXAMFETAMINE DIMESYLATE 20 MG PO CAPS: 20.0000 mg | ORAL_CAPSULE | Freq: Every day | ORAL | 0 refills | Status: DC

## 2020-03-05 NOTE — Progress Notes (Addendum)
[facility] MD/PA/NP OP Progress Note  03/05/2020 8:46 AM LELAH RENNAKER  MRN:  [id number] Interview was conducted by phone and I verified that I was speaking with the correct person using two identifiers. I discussed the limitations of evaluation and management by telemedicine and  the availability of in person appointments. Patient expressed understanding and agreed to proceed. Participants in the visit: patient (location - home); physician (location - home office).  Chief Complaint: "I need my medications fixed".[ressed belief that generic asenapine which she got instead of Saphris was less  HPI: 60yo divorced AAF with schizoaffective disorder bipolar type(vs paranoid schizophrenia),chronicPTSDand bingeeating disorder.She tried ziprazidone but developedQTC prolongation on itso it was changed toSaphriswhich she believes is a best medication she has ever been on. She had also been prescribed risperidone, aripiprazole, lurasidone in the past. [name] also on escitalopram andwas onVyvanse for binge eating disorder. Shaquanna used to weigh over 300 lbs and since starting Vyvanse her binge eating is well controlled and she was able to lose a lot of weight). She continues to take diazepam 10 mgtidand trazodone for sleep.She has a strained relationship with her family - mother in particular, but also son. She remains paranoid about both of them.We have tried to cross taper it to brexpiprazole but she she felt more paranoid and stayed on generic Saphris with minimal benefit. We have stopped brexpiprazole and increased dose of Saphris (brand) to 10 mg tid. She remained paranoid feeling unsafe living alone in her apartment. Laneah believed someone (likely inspired by her son) is getting in there and stealing her clothes. She apparently barricaded self there, was not eating and eventually was brought by AS to hospital on 02/18/20. She was found to have AKF, UTI, delirium. She stayed on medical floor until 02/26/20 and  was seen in consultation by psychiatry. Her electrolytes normalized, mental staus improved. [name] back at home still mildly paranoid and anxious but admittedly in better state of mind. Her escitalopram was held (hyponatremia), dose of Vyvanse and prazosin decreased. She has resumed sessions with Lise Auer.    Visit Diagnosis:    ICD-10-CM   1. Schizoaffective disorder, bipolar type ([place])  F25.0   2. PTSD (post-traumatic stress disorder)  F43.10     Past Psychiatric History: Please see intake H&P>  Past Medical History:  Past Medical History:  Diagnosis Date  . Agitation 11/22/2017  . Anoxic brain injury ([place]) 09/08/2016   C. Arrest due to respiratory failure and COPD exacerbation  . Anxiety   . Arthritis    "all over" (04/10/2016)  . Asthma 10/18/2010  . Binge eating disorder   . Cardiac arrest ([place]) 09/08/2016   PEA  . Carotid artery stenosis    1-39% bilateral by dopplers 11/2016  . Chronic diastolic (congestive) heart failure ([place])   . Chronic kidney disease    "I see a kidney dr." (04/10/2016)  . Chronic pain syndrome 06/18/2012  . Chronic post-traumatic stress disorder (PTSD) 05/27/2018  . Chronic respiratory failure with hypoxia and hypercapnia (HCC) 06/22/2015   TRILOGY Vent >AVAPA-ES., Vt target 200-400, Max P 30 , PS max 20 , PS min 6-10 , E Max 6, E Min 4, Rate Auto AVAPS Rate 2 (titrate for pt comfort) , bleed O2 at 5l/m continuous flow .   [name] CKD (chronic kidney disease) stage 3, GFR 30-59 ml/min (HCC) 12/15/2016  . Closed displaced fracture of fifth metacarpal bone 03/21/2018  . Cocaine use disorder, severe, in sustained remission ([place]) 12/17/2015  . Complication of  anesthesia    decreased bp, decreased heart rate  . COPD (chronic obstructive pulmonary disease) ([place]) 07/08/2014  . Depression   . Diabetic neuropathy ([place]) 04/24/2011  . Difficulty with speech 01/24/2018  . Disorder of nervous system   . Drug abuse ([place]) 11/21/2017  . Dyslipidemia 04/24/2011  . Elevated  troponin 04/28/2012  . Emphysema   . Encephalopathy 11/21/2017  . Essential hypertension 03/22/2016  . Fibula fracture 07/10/2016  . Frequent falls 10/11/2017  . GERD (gastroesophageal reflux disease)   . Gout 04/11/2017  . Heart attack ([place]) 1980s  . History of blood transfusion 1994   "couldn't stop bleeding from my period"  . History of drug abuse in remission ([place]) 11/28/2015   Quit in 2017  . Hyperlipidemia LDL goal <70   . Incontinence   . Manic depression ([place])   . Morbid obesity ([name]) 10/18/2010  . Obstructive sleep apnea 10/18/2010  . On home oxygen therapy    "6L; 24/7" (04/10/2016)  . OSA on CPAP    "wear mask sometimes" (04/10/2016)  . Paranoid ([place])    "sometimes; I'm on RX for it" (04/10/2016)  . Prolonged Q-T interval on ECG   . Rectal bleeding 12/31/2015  . Right carotid bruit 11/09/2016  . Schizoaffective disorder, bipolar type ([place]) 04/05/2018  . Seasonal allergies   . Seborrheic keratoses 12/31/2013  . Seizures ([place])    "don't know what kind; last one was ~ 1 yr ago" (04/10/2016)  . Stroke [facility]) 1980s   denies residual on 04/10/2016  . Thrush 09/19/2013  . Type 2 diabetes mellitus ([place]) 10/18/2010    Past Surgical History:  Procedure Laterality Date  . CESAREAN SECTION  1997  . HERNIA REPAIR    . IR CHOLANGIOGRAM EXISTING TUBE  07/20/2016  . IR PERC CHOLECYSTOSTOMY  05/10/2016  . IR RADIOLOGIST EVAL & MGMT  06/08/2016  . IR RADIOLOGIST EVAL & MGMT  06/29/2016  . IR SINUS/FIST TUBE CHK-NON GI  07/12/2016  . RIGHT/LEFT HEART CATH AND CORONARY ANGIOGRAPHY N/A 06/19/2017   Procedure: RIGHT/LEFT HEART CATH AND CORONARY ANGIOGRAPHY;  Surgeon: Jolaine Artist, MD;  Location: [facility];  Service: Cardiovascular;  Laterality: N/A;  . TIBIA IM NAIL INSERTION Right 07/12/2016   Procedure: INTRAMEDULLARY (IM) NAIL RIGHT TIBIA;  Surgeon: Leandrew Koyanagi, MD;  Location: [facility];  Service: Orthopedics;  Laterality: Right;  . UMBILICAL HERNIA REPAIR  ~ 1963   "that's why I don't  have a belly button"  . VAGINAL HYSTERECTOMY      Family Psychiatric History: Reviewed.  Family History:  Family History  Problem Relation Age of Onset  . Cancer Father        prostate  . Cancer Mother        lung  . Depression Mother   . Depression Sister   . Anxiety disorder Sister   . Schizophrenia Sister   . Bipolar disorder Sister   . Depression Sister   . Depression Brother   . Heart failure Other        cousin    Social History:  Social History   Socioeconomic History  . Marital status: Widowed    Spouse name: Not on file  . Number of children: 3  . Years of education: Not on file  . Highest education level: Not on file  Occupational History  . Occupation: disabled    Comment: factory production  Tobacco Use  . Smoking status: Former Smoker    Packs/day: 1.50  Years: 38.00    Pack years: 57.00    Types: Cigarettes    Start date: 03/13/1977    Quit date: 04/10/2016    Years since quitting: 3.9  . Smokeless tobacco: Never Used  Vaping Use  . Vaping Use: Never used  Substance and Sexual Activity  . Alcohol use: No    Alcohol/week: 0.0 standard drinks  . Drug use: No    Types: Cocaine    Comment: 04/10/2016 "last used cocaine back in November 2017"  . Sexual activity: Not Currently    Birth control/protection: Surgical  Other Topics Concern  . Not on file  Social History Narrative   Has 1 son, [name]   Lives with son and his boyfriend   Her house has ramps and handrails should she ever needs them.    Her mother lives down the street from her and is a good support person in addition to her son.   She drives herself, has private transportation.    Cocaine free since 02/24/16, smoke free since 04/10/16   Social Determinants of Health   Financial Resource Strain: Not on file  Food Insecurity: Not on file  Transportation Needs: Not on file  Physical Activity: Not on file  Stress: Not on file  Social Connections: Not on file    Allergies:   Allergies  Allergen Reactions  . Hydrocodone Shortness Of Breath  . Hydrocodone-Acetaminophen Shortness Of Breath  . Hydroxyzine Anaphylaxis and Shortness Of Breath  . Latuda [Lurasidone Hcl] Anaphylaxis  . Lurasidone Anaphylaxis  . Magnesium-Containing Compounds Anaphylaxis  . Prednisone Anaphylaxis, Swelling and Other (See Comments)    Tongue swelling  . Tramadol Anaphylaxis and Swelling  . Codeine Nausea And Vomiting  . Other Rash  . Sulfa Antibiotics Itching  . Tape Rash    Metabolic Disorder Labs: Lab Results  Component Value Date   HGBA1C 4.8 02/20/2020   MPG 91.06 02/20/2020   MPG 125.5 01/07/2017   No results found for: PROLACTIN Lab Results  Component Value Date   CHOL 127 08/05/2018   TRIG 53 08/05/2018   HDL 62 08/05/2018   CHOLHDL 2.0 08/05/2018   VLDL 38 01/07/2017   LDLCALC 54 08/05/2018   LDLCALC 56 01/07/2017   Lab Results  Component Value Date   TSH 0.358 02/18/2020   TSH 1.233 10/27/2019    Therapeutic Level Labs: No results found for: LITHIUM Lab Results  Component Value Date   VALPROATE 20 (L) 09/09/2016   VALPROATE 46 (L) 07/10/2016   No components found for:  CBMZ  Current Medications: Current Outpatient Medications  Medication Sig Dispense Refill  . escitalopram (LEXAPRO) 20 MG tablet Take 1 tablet (20 mg total) by mouth daily. 30 tablet 2  . albuterol (PROVENTIL) (2.5 MG/3ML) 0.083% nebulizer solution Take 3 mLs (2.5 mg total) by nebulization every 6 (six) hours as needed for wheezing or shortness of breath. (Patient not taking: No sig reported) 75 mL 5  . albuterol (VENTOLIN HFA) 108 (90 Base) MCG/ACT inhaler Inhale 2 puffs into the lungs every 6 (six) hours as needed for wheezing or shortness of breath. (Patient not taking: No sig reported) 18 g 2  . amLODipine (NORVASC) 10 MG tablet Take 1 tablet (10 mg total) by mouth daily.    . Blood Pressure Monitoring (BLOOD PRESSURE CUFF) MISC 1 Units by Does not apply route daily. 1 each 0   . colchicine 0.6 MG tablet Take 1 tablet (0.6 mg total) by mouth 2 (two) times daily. (Patient not  taking: No sig reported) 180 tablet 0  . diazepam (VALIUM) 10 MG tablet Take 1 tablet (10 mg total) by mouth 3 (three) times daily as needed for anxiety. 90 tablet 2  . Elastic Bandages & Supports (MEDICAL COMPRESSION STOCKINGS) MISC 1 Units by Does not apply route daily. 1 each 1  . febuxostat (ULORIC) 40 MG tablet Take 1 tablet (40 mg total) by mouth daily. (Patient not taking: No sig reported) 90 tablet 0  . feeding supplement (ENSURE ENLIVE / ENSURE PLUS) LIQD Take 237 mLs by mouth 2 (two) times daily between meals.    . ferrous sulfate 325 (65 FE) MG tablet Take 1 tablet (325 mg total) by mouth daily with breakfast. (Patient not taking: No sig reported) 180 tablet 0  . Fluticasone-Umeclidin-Vilant (TRELEGY ELLIPTA) 100-62.5-25 MCG/INH AEPB Inhale 1 puff into the lungs daily. (Patient not taking: No sig reported) 60 each 11  . folic acid (FOLVITE) 1 MG tablet Take 1 tablet (1 mg total) by mouth daily.    [name] gabapentin (NEURONTIN) 600 MG tablet Take 0.5 tablets (300 mg total) by mouth daily.    . hydrALAZINE (APRESOLINE) 50 MG tablet Take 1 tablet (50 mg total) by mouth 2 (two) times daily.    Derrill Memo ON 03/25/2020] lisdexamfetamine (VYVANSE) 20 MG capsule Take 1 capsule (20 mg total) by mouth daily with breakfast. 30 capsule 0  . montelukast (SINGULAIR) 10 MG tablet Take 1 tablet (10 mg total) by mouth at bedtime. (Patient not taking: No sig reported) 30 tablet 11  . Multiple Vitamin (MULTIVITAMIN WITH MINERALS) TABS tablet Take 1 tablet by mouth daily.    [name] omeprazole (PRILOSEC) 40 MG capsule Take 1 capsule (40 mg total) by mouth daily. (Patient not taking: No sig reported) 90 capsule 1  . prazosin (MINIPRESS) 2 MG capsule Take 1 capsule (2 mg total) by mouth at bedtime. 90 capsule 0  . SAPHRIS 10 MG SUBL Place 1 tablet (10 mg total) under the tongue in the morning, at noon, and at bedtime. 90  tablet 2  . thiamine 100 MG tablet Take 1 tablet (100 mg total) by mouth daily.    . traZODone (DESYREL) 100 MG tablet Take 1 tablet (100 mg total) by mouth at bedtime.     No current facility-administered medications for this visit.     Psychiatric Specialty Exam: Review of Systems  Musculoskeletal: Positive for gait problem.  Psychiatric/Behavioral: The patient is nervous/anxious.   All other systems reviewed and are negative.   There were no vitals taken for this visit.There is no height or weight on file to calculate BMI.  General Appearance: NA  Eye Contact:  NA  Speech:  Clear and Coherent and Normal Rate  Volume:  Normal  Mood:  Anxious  Affect:  NA  Thought Process:  Goal Directed  Orientation:  Full (Time, Place, and Person)  Thought Content: Paranoid Ideation   Suicidal Thoughts:  No  Homicidal Thoughts:  No  Memory:  Immediate;   Fair Recent;   Fair Remote;   Good  Judgement:  Fair  Insight:  Fair  Psychomotor Activity:  NA  Concentration:  Concentration: Fair  Recall:  [place] of Knowledge: Fair  Language: Good  Akathisia:  Negative  Handed:  Right  AIMS (if indicated): not done  Assets:  Communication Skills Desire for Improvement Financial Resources/Insurance Housing Resilience  ADL's:  Intact  Cognition: WNL  Sleep:  Fair   Screenings: AUDIT   Flowsheet Row Admission (Discharged)  from 12/01/2015 in [facility]  Alcohol Use Disorder Identification Test Final Score (AUDIT) 0    GAD-7   Flowsheet Row Office Visit from 03/21/2018 in [facility] for [facility]  Total GAD-7 Score 4    PHQ2-9   [facility] Office Visit from confidential encounter on 12/11/2018 Office Visit from confidential encounter on 12/10/2018 Office Visit from confidential encounter on 11/18/2018 Office Visit from confidential encounter on 11/08/2018 Telemedicine from confidential encounter on 10/25/2018  PHQ-2 Total Score 0 4 2 1 4   PHQ-9  Total Score -- 10 8 1 16     [employer] ED to Hosp-Admission (Discharged) from 02/18/2020 in [facility] ED from 02/03/2019 in [facility] DEPT ED from 10/06/2018 in [facility] DEPT  C-SSRS RISK CATEGORY Error: Q6 is Yes, you must answer 7 Error: Q6 is Yes, you must answer 7 No Risk       Assessment and Plan: 59yo divorced AAF with schizoaffective disorder bipolar type(vs paranoid schizophrenia),chronicPTSDand bingeeating disorder.She tried ziprazidone but developedQTC prolongation on itso it was changed toSaphriswhich she believes is a best medication she has ever been on. She had also been prescribed risperidone, aripiprazole, lurasidone in the past. [name] also on escitalopram andwas onVyvanse for binge eating disorder. Brailee used to weigh over 300 lbs and since starting Vyvanse her binge eating is well controlled and she was able to lose a lot of weight). She continues to take diazepam 10 mgtidand trazodone for sleep (100 mg plus 50 mg in am "for nerves").She has a strained relationship with her family - mother in particular, but also son. [name] not comfortable living with her mother but has an independent apartmentas November 2021. She admits to feeling moderately depressed and anxious and exp effectiveincontrollingparanoia/irritability/anxiety.We have tried to cross taper it to brexpiprazole but she she felt more paranoid and stayed on generic Saphris with minimal benefit. We have stopped brexpiprazole and increased dose of Saphris (brand) to 10 mg tid. She remained paranoid feeling unsafe living alone in her apartment. J[place] believed someone (likely inspired by her son) is getting in there and stealing her clothes. She apparently barricaded self there, was not eating and eventually was brought by AS to hospital on 02/18/20. She was found to have AKF, UTI, delirium. She stayed on medical  floor until 02/26/20 and was seen in consultation by psychiatry. Her electrolytes normalized, mental staus improved. [name] back at home still mildly paranoid and anxious but admittedly in better state of mind. Her escitalopram was held (hyponatremia), dose of Vyvanse and prazosin decreased. She has resumed sessions with Lise Auer.[name]  Dx: Schizoaffective disorder bipolar type  Plan: Continue asenapine (brand) 10 mg tid, trazodone100 mg prn sleep,,prazosin 2 mg at HS, gabapentin 600 bidand diazepam 10 mg tid,resume escitalopramat 20 mg (was on 40 mg) 40 mg daily. [name] also taking  Vyvanse 20 for binge eating. We have rerferred her to more intense CST services and are waiting for response. Next appointment in4weeks. The plan was discussed with patient who had an opportunity to ask questions and these were all answered.I spend73minutes in phone consultation with the patient.    Stephanie Acre, MD 03/05/2020, 8:46 AM

## 2020-03-06 ENCOUNTER — Other Ambulatory Visit: Payer: Self-pay | Admitting: Registered Nurse

## 2020-03-09 ENCOUNTER — Ambulatory Visit: Payer: Self-pay | Admitting: *Deleted

## 2020-03-09 ENCOUNTER — Telehealth: Payer: Self-pay | Admitting: Family Medicine

## 2020-03-09 NOTE — Telephone Encounter (Signed)
Pt called in c/o multiple issues.  Her legs are swollen and painful however she tells me this has been going on for years.    [name] aching in her uterus and was for an ultrasound but didn't have it done because she was in the hospital.  She was talking about her body being shut down and her kidneys were messed up.   I just let her talk to see if I could figure out how to best help her.  I was finally able to get her to tell me exactly what she was calling about today.   [name] requesting Flexeril for muscle spasms.   The medicine Dr. Margarita Rana gave her is not working.    She went on to tell me she has a curve in her spine so her back hurts and has spasms.    See notes below.    I let her know I would send a note to Dr. Margarita Rana to see if she would prescribe the Flexeril since the other medicine is not working.   She gave me the phone number [phone].  There are no appts with Dr. Margarita Rana.  I sent my notes to [facility] for Dr. Margarita Rana.   Reason for Disposition . [1] MODERATE pain (e.g., interferes with normal activities, limping) AND [2] present > 3 days  Answer Assessment - Initial Assessment Questions 1. LOCATION: "Which ankle is swollen?" "Where is the swelling?"     I'm having pain and swelling in my legs and ankles.  I'm aching in my uterus.   I was supposed to have an U/S  But I went into the hospital.  There until 2/16-27.  My whole body has shut down.  The medication starts with a D shuts down my kidneys.   I've been through a lot of trauma with my mind.   Is there any thing else.  Yesterday I was crying.   I have a curve in my spine.   I need Flexeril for the muscle spasms.   The medicine she gave me is not working.   The pain and swelling in my legs has been going on for years.     All in my legs, back and side muscle cramps.   My legs are all swollen up.   My legs hurt too real bad.     I don't drive so I have to ride with people.   I will be out  between 2:00-5:00.   2. ONSET: "When did the swelling start?"     *No Answer* 3. SIZE: "How large is the swelling?"     *No Answer* 4. PAIN: "Is there any pain?" If Yes, ask: "How bad is it?" (Scale 1-10; or mild, moderate, severe)   - NONE (0): no pain.   - MILD (1-3): doesn't interfere with normal activities.    - MODERATE (4-7): interferes with normal activities (e.g., work or school) or awakens from sleep, limping.    - SEVERE (8-10): excruciating pain, unable to do any normal activities, unable to walk.      *No Answer* 5. CAUSE: "What do you think caused the ankle swelling?"     *No Answer* 6. OTHER SYMPTOMS: "Do you have any other symptoms?" (e.g., fever, chest pain, difficulty breathing, calf pain)     *No Answer* 7. PREGNANCY: "Is there any chance you are pregnant?" "When was your last menstrual period?"     *No Answer*  Protocols used: ANKLE SWELLING-A-AH

## 2020-03-09 NOTE — Telephone Encounter (Signed)
See Triage encounter.

## 2020-03-09 NOTE — Telephone Encounter (Signed)
Pt called back, requesting a refill of Flexeril 10 or 15 MG. States she is experiencing severe pain and needs relief. Transferred to nurse triage.   [facility] [place], [address] AT [address]  [place] [address]  Phone: [phone] Fax: [phone]

## 2020-03-09 NOTE — Telephone Encounter (Signed)
Copied from [place] [id number]. Topic: General - Other >> Mar 09, 2020 10:44 AM Erick Blinks wrote: Reason for CRM: Pt called and has questions regarding an ultrasound, patient believes that she had to cancel an ultrasound appt because she was in the hospital.   Please advise  Best contact: [phone]   I see where patient has missed an [name] appointment. Did not know if I needed to reach out to her and advise her to contact them or if our office was responsible for scheduling. Please advise.

## 2020-03-10 ENCOUNTER — Other Ambulatory Visit: Payer: Self-pay

## 2020-03-10 ENCOUNTER — Ambulatory Visit (INDEPENDENT_AMBULATORY_CARE_PROVIDER_SITE_OTHER): Payer: 59 | Admitting: Physical Medicine and Rehabilitation

## 2020-03-10 ENCOUNTER — Telehealth (HOSPITAL_COMMUNITY): Payer: Medicaid Other | Admitting: Psychiatry

## 2020-03-10 ENCOUNTER — Encounter: Payer: Self-pay | Admitting: Physical Medicine and Rehabilitation

## 2020-03-10 ENCOUNTER — Ambulatory Visit: Payer: Self-pay

## 2020-03-10 VITALS — BP 104/69 | HR 73

## 2020-03-10 DIAGNOSIS — M5416 Radiculopathy, lumbar region: Secondary | ICD-10-CM | POA: Diagnosis not present

## 2020-03-10 MED ORDER — BETAMETHASONE SOD PHOS & ACET 6 (3-3) MG/ML IJ SUSP
6.0000 mg | Freq: Once | INTRAMUSCULAR | Status: DC
Start: 2020-03-10 — End: 2021-01-14

## 2020-03-10 MED ORDER — CYCLOBENZAPRINE HCL 10 MG PO TABS: 10.0000 mg | ORAL_TABLET | Freq: Two times a day (BID) | ORAL | 3 refills | Status: DC | PRN

## 2020-03-10 MED ORDER — BETAMETHASONE SOD PHOS & ACET 6 (3-3) MG/ML IJ SUSP: 12.0000 mg | Freq: Once | INTRAMUSCULAR | Status: DC

## 2020-03-10 NOTE — Addendum Note (Signed)
Addended by: [name] on: 03/10/2020 01:27 PM   Modules accepted: Orders

## 2020-03-10 NOTE — Patient Instructions (Signed)
[facility] Discharge Instructions  *At any time if you have questions or concerns they can be answered by calling [phone]  All Patients: . You may experience an increase in your symptoms for the first 2 days (it can take 2 days to 2 weeks for the steroid/cortisone to have its maximal effect). . You may use ice to the site for the first 24 hours; 20 minutes on and 20 minutes off and may use heat after that time. . You may resume and continue your current pain medications. If you need a refill please contact the prescribing physician. . You may resume your medications if any were stopped for the procedure. . You may shower but no swimming, tub bath or Jacuzzi for 24 hours. . Please remove bandage after 4 hours. . You may resume light activities as tolerated. . If you had Spine Injection, you should not drive for the next 3 hours due to anesthetics used in the procedure. Please have someone drive for you.  *If you have had sedation, Valium, Xanax, or lorazepam: Do not drive or use public transportation for 24 hours, do not operating hazardous machinery or make important personal/business decisions for 24 hours.  POSSIBLE STEROID SIDE EFFECTS: If experienced these should only last for a short period. Change in menstrual flow  Edema in (swelling)  Increased appetite Skin flushing (redness)  Skin rash/acne  Thrush (oral) Vaginitis    Increased sweating  Depression Increased blood glucose levels Cramping and leg/calf  Euphoria (feeling happy)  POSSIBLE PROCEDURE SIDE EFFECTS: Please call our office if concerned. Increased pain Increased numbness/tingling  Headache Nausea/vomiting Hematoma (bruising/bleeding) Edema (swelling at the site) Weakness  Infection (red/drainage at site) Fever greater than 100.5F  *In the event of a headache after epidural steroid injection: Drink plenty of fluids, especially water and try to lay flat when possible. If the headache does not get  better after a few days or as always if concerned please call the office.  

## 2020-03-10 NOTE — Progress Notes (Signed)
Pt state lower back pain that travels down both legs Pt state walking makes the pain come up her legs. Pt state when she lay down her legs feel swollen. Pt state when get up the pain hurts after five minutes. Pt state she pain meds tp help ease the pain.  Numeric Pain Rating Scale and Functional Assessment Average Pain 8   In the last MONTH (on 0-10 scale) has pain interfered with the following?  1. General activity like being  able to carry out your everyday physical activities such as walking, climbing stairs, carrying groceries, or moving a chair?  Rating(8)   +Driver, -BT, -Dye Allergies.

## 2020-03-10 NOTE — Telephone Encounter (Signed)
Pt was called and informed to contact OBGYN office to reschedule [name]

## 2020-03-10 NOTE — Progress Notes (Signed)
Tiffany [name] - 60 y.o. female MRN [id number]  Date of birth: [birth year]  Office Visit Note: Visit Date: 03/10/2020 PCP: [name], MD Referred by: [name], MD  Subjective: Chief Complaint  Patient presents with  . Lower Back - Pain  . Left Leg - Pain  . Right Leg - Pain   HPI:  Tiffany [name] is a 60 y.o. female who comes in today at the request of Dr. Eunice Blase for planned Left L4-L5 Lumbar epidural steroid injection with fluoroscopic guidance.  The patient has failed conservative care including home exercise, medications, time and activity modification.  This injection will be diagnostic and hopefully therapeutic.  Please see requesting physician notes for further details and justification.  MRI reviewed with images and spine model.  MRI reviewed in the note below.  Her case is very complicated with significant behavioral health issues and she has actually had to reschedule her appointment several times as well as medication challenges with multiple allergies including what is listed as anaphylaxis to prednisone.  Never heard of anaphylaxis to prednisone however she says that she has done cortisone extremely well and that has not been an issue.  She also has diabetes and polyneuropathy.  As noted in the report of the injection itself very difficult injection of the patient's anxiety and body habitus etc.  MRI reviewed again today with images shows an interlaminar approach at L4-5 can be done if she does have significant stenosis here at L5-S1.    ROS Otherwise per HPI.  Assessment & Plan: Visit Diagnoses:    ICD-10-CM   1. Lumbar radiculopathy  M54.16 XR C-ARM NO REPORT    Epidural Steroid injection    betamethasone acetate-betamethasone sodium phosphate (CELESTONE) injection 6 mg    DISCONTINUED: betamethasone acetate-betamethasone sodium phosphate (CELESTONE) injection 12 mg    Plan: No additional findings.   Meds & Orders:  Meds ordered this encounter   Medications  . DISCONTD: betamethasone acetate-betamethasone sodium phosphate (CELESTONE) injection 12 mg  . betamethasone acetate-betamethasone sodium phosphate (CELESTONE) injection 6 mg    Orders Placed This Encounter  Procedures  . XR C-ARM NO REPORT  . Epidural Steroid injection    Follow-up: Return for visit to requesting physician as needed.   Procedures: No procedures performed  Lumbar Epidural Steroid Injection - Interlaminar Approach with Fluoroscopic Guidance  Patient: Tiffany [name]      Date of Birth: [birth year] MRN: [id number] PCP: [name], MD      Visit Date: 03/10/2020   Universal Protocol:     Consent Given By: the patient  Position: PRONE  Additional Comments: Vital signs were monitored before and after the procedure. Patient was prepped and draped in the usual sterile fashion. The correct patient, procedure, and site was verified.   Injection Procedure Details:   Procedure diagnoses: Lumbar radiculopathy [M54.16]   Meds Administered:  Meds ordered this encounter  Medications  . DISCONTD: betamethasone acetate-betamethasone sodium phosphate (CELESTONE) injection 12 mg  . betamethasone acetate-betamethasone sodium phosphate (CELESTONE) injection 6 mg     Laterality: Left  Location/Site:  L4-L5  Needle: 4.5 in., 20 ga. Tuohy  Needle Placement: Paramedian epidural  Findings:   -Comments: Excellent flow of contrast into the epidural space.  Procedure Details: Using a paramedian approach from the side mentioned above, the region overlying the inferior lamina was localized under fluoroscopic visualization and the soft tissues overlying this structure were infiltrated with 4 ml. of 1% Lidocaine without Epinephrine. The Tuohy needle was  inserted into the epidural space using a paramedian approach.   The epidural space was localized using loss of resistance along with counter oblique bi-planar fluoroscopic views.  After negative aspirate for  air, blood, and CSF, a 2 ml. volume of Isovue-250 was injected into the epidural space and the flow of contrast was observed. Radiographs were obtained for documentation purposes.    The injectate was administered into the level noted above.   Additional Comments:  No complications occurred Dressing: 2 x 2 sterile gauze and Band-Aid    Post-procedure details: Patient was observed during the procedure. Post-procedure instructions were reviewed.  Patient left the clinic in stable condition.     Clinical History: MRI LUMBAR SPINE WITHOUT CONTRAST  TECHNIQUE: Multiplanar, multisequence MR imaging of the lumbar spine was performed. No intravenous contrast was administered.  COMPARISON:  Lumbar radiographs 12/17/2013. CT Abdomen and Pelvis 10/30/2019. CTA chest 08/22/2015.  FINDINGS: Segmentation: 12 full sized pairs of ribs, and 6 lumbar type vertebral bodies demonstrated on prior CTs such that fully lumbarized S1 level is designated for the purposes of this report. Correlation with radiographs is recommended prior to any operative intervention.  Alignment: Mildly exaggerated lumbar lordosis, stable since October. Mild grade 1 anterolisthesis at L4-L5 and L5-S1.  Vertebrae: No marrow edema or evidence of acute osseous abnormality. Background bone marrow signal within normal limits. Intact visible sacrum and SI joints.  Conus medullaris and cauda equina: Conus extends to the L1-L2 level. r No lower spinal cord or conus signal abnormality.  Paraspinal and other soft tissues: Large, at least 7.4 cm diameter cyst at the pelvic inlet redemonstrated and appear to arise from the left ovary on the October CT. Size appears stable. Visible portions of the cyst appear simple.  Negative visible abdominal viscera. Negative lumbar paraspinal soft tissues.  Disc levels:  T12-L1:  Negative.  L1-L2:  Negative.  L2-L3:  Mild facet hypertrophy.  Otherwise  negative.  L3-L4: Moderate facet and ligament flavum hypertrophy. Mild foraminal disc bulging. No spinal or lateral recess stenosis. Borderline to mild bilateral L3 foraminal stenosis.  L4-L5: Mild anterolisthesis with disc space loss and circumferential disc bulge. Disc extrusion small superimposed midline and slightly on series 3, image 9. Cephalad moderate to severe facet and ligament flavum hypertrophy. Moderate to severe spinal stenosis. Moderate bilateral lateral recess stenosis (L5 nerve levels). Mild to moderate left and mild right L4 foraminal stenosis.  L5-S1: Grade 1 anterolisthesis with disc desiccation, disc space loss, circumferential disc bulge. Moderate to severe ligament flavum hypertrophy. Moderate to severe facet hypertrophy, although question of developing facet ankylosis at this level on series 6 image 26). Mild to moderate lateral recess stenosis greater on the left (left S1 nerve level). Borderline to mild spinal stenosis. Mild to moderate left and borderline right L5 foraminal stenosis.  S1-S2: Fully lumbarized. Mild circumferential disc bulge. Superimposed small midline annular fissure of the disc on series 3, image 9. Mild to moderate facet hypertrophy. No stenosis.  IMPRESSION: 1. Transitional lumbosacral anatomy with confirm 12 full size ribs and 6 lumbar type vertebrae. Lumbarized S1 level with full size S1-S2 disc space designated for the purposes of this report. Correlation with radiographs is recommended prior to any operative intervention.  2. Grade 1 anterolisthesis at L4-L5 and L5-S1 with disc and advanced posterior element degeneration. Possible developing facet ankylosis at the latter. Subsequent multifactorial moderate to severe spinal, moderate lateral recess and foraminal stenosis at L4-L5. Up to moderate left lateral recess and foraminal stenosis at L5-S1.  3. Large, unilocular pelvic cyst (7.4 cm) appears stable since an October  CT Abdomen and Pelvis (please see that report).   Electronically Signed   By: Genevie Ann M.D.   On: 12/29/2019 16:34     Objective:  VS:  HT:    WT:   BMI:     BP:104/69  HR:73bpm  TEMP: ( )  RESP:  Physical Exam Vitals and nursing note reviewed.  Constitutional:      General: [name] not in acute distress.    Appearance: Normal appearance. [name] obese. [name] not ill-appearing.  HENT:     Head: Normocephalic and atraumatic.     Right Ear: External ear normal.     Left Ear: External ear normal.  Eyes:     Extraocular Movements: Extraocular movements intact.  Cardiovascular:     Rate and Rhythm: Normal rate.     Pulses: Normal pulses.  Pulmonary:     Effort: Pulmonary effort is normal. No respiratory distress.  Abdominal:     General: There is no distension.     Palpations: Abdomen is soft.  Musculoskeletal:        General: Tenderness present.     Cervical back: Neck supple.     Right lower leg: No edema.     Left lower leg: No edema.     Comments: Patient has good distal strength with no pain over the greater trochanters.  No clonus or focal weakness.  Skin:    Findings: No erythema, lesion or rash.  Neurological:     General: No focal deficit present.     Mental Status: [name] alert and oriented to person, place, and time.     Sensory: No sensory deficit.     Motor: No weakness or abnormal muscle tone.     Coordination: Coordination normal.  Psychiatric:        Mood and Affect: Mood normal.        Behavior: Behavior normal.      Imaging: No results found.

## 2020-03-10 NOTE — Telephone Encounter (Signed)
I have sent a prescription for Flexeril to her pharmacy. For issues with her uterus she is being followed by GYN and needs to reach out to them.  Her chronic pedal edema needs to be addressed at an office visit with any available clinician.

## 2020-03-11 ENCOUNTER — Other Ambulatory Visit: Payer: Self-pay

## 2020-03-11 DIAGNOSIS — N3281 Overactive bladder: Secondary | ICD-10-CM

## 2020-03-11 NOTE — Telephone Encounter (Signed)
Called pt left message RX sent to pharmacy 

## 2020-03-16 ENCOUNTER — Other Ambulatory Visit: Payer: Self-pay

## 2020-03-16 ENCOUNTER — Ambulatory Visit (INDEPENDENT_AMBULATORY_CARE_PROVIDER_SITE_OTHER): Payer: 59 | Admitting: Psychiatry

## 2020-03-16 ENCOUNTER — Telehealth (HOSPITAL_COMMUNITY): Payer: Self-pay | Admitting: *Deleted

## 2020-03-16 ENCOUNTER — Encounter (HOSPITAL_COMMUNITY): Payer: Self-pay | Admitting: Psychiatry

## 2020-03-16 DIAGNOSIS — F250 Schizoaffective disorder, bipolar type: Secondary | ICD-10-CM

## 2020-03-16 NOTE — Telephone Encounter (Signed)
Pt called c/o increased "paranoia" and states that she has been compliant with all medications but it's getting worse. Pt sys has been isolating due to this. denies that iit's anxiety and wants you to add medication to eluviate the "paranoia".  Pt ha san appointment upcoming on 04/05/20. Please review and advise.
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
## 2020-03-17 ENCOUNTER — Telehealth (HOSPITAL_COMMUNITY): Payer: Self-pay

## 2020-03-17 ENCOUNTER — Telehealth: Payer: Self-pay

## 2020-03-17 ENCOUNTER — Telehealth: Payer: Self-pay | Admitting: Emergency Medicine

## 2020-03-17 DIAGNOSIS — J42 Unspecified chronic bronchitis: Secondary | ICD-10-CM

## 2020-03-17 MED ORDER — TRELEGY ELLIPTA 100-62.5-25 MCG/INH IN AEPB: 1.0000 | INHALATION_SPRAY | Freq: Every day | RESPIRATORY_TRACT | 3 refills | Status: DC

## 2020-03-17 MED ORDER — ALBUTEROL SULFATE (2.5 MG/3ML) 0.083% IN NEBU: 2.5000 mg | INHALATION_SOLUTION | Freq: Four times a day (QID) | RESPIRATORY_TRACT | 5 refills | Status: DC | PRN

## 2020-03-17 MED ORDER — ALBUTEROL SULFATE HFA 108 (90 BASE) MCG/ACT IN AERS: 2.0000 | INHALATION_SPRAY | Freq: Four times a day (QID) | RESPIRATORY_TRACT | 5 refills | Status: DC | PRN

## 2020-03-17 NOTE — Telephone Encounter (Signed)
Called patient and scheduled with Dr. Junius Roads on 3/8. She wants to see if there is anything he can do for her over the phone because she is in a lot of pain and coming to the office is very difficult for her.

## 2020-03-17 NOTE — Telephone Encounter (Signed)
Patient called she is requesting rx for pain med's call back:[phone]

## 2020-03-17 NOTE — Telephone Encounter (Signed)
She will need f/up Dr. Junius Roads to discuss.

## 2020-03-17 NOTE — Telephone Encounter (Signed)
Please advise 

## 2020-03-17 NOTE — Telephone Encounter (Signed)
I will call her tomorrow morning as I have few breaks in schedule.

## 2020-03-17 NOTE — Telephone Encounter (Signed)
She has been feeling paranoid for past decade or longer and it has been really bad for 6 months at least. No changes I made helped and other I proposed she was not willing to try. [name] on a very high dose of a medication that worked for her in the past. At this point I have limited, if any options, for her. I would prefer to wait till our scheduled appointment .

## 2020-03-17 NOTE — Telephone Encounter (Signed)
Called and spoke with Patient. Patient stated she needed refills of Trelegy, Albuterol, and Albuterol neb sent to [facility]. Requested prescriptions sent to pharmacy. Nothing further at this time.

## 2020-03-17 NOTE — Telephone Encounter (Signed)
Patient called again and stated that she needs to talk with you before her appointment on 3/21. She stated that she can't wait that long to talk to you. She can be reached at [phone] (H) or [phone] (C)

## 2020-03-18 ENCOUNTER — Other Ambulatory Visit (HOSPITAL_COMMUNITY): Payer: Self-pay | Admitting: Psychiatry

## 2020-03-18 MED ORDER — TOPIRAMATE 25 MG PO TABS: 25.0000 mg | ORAL_TABLET | Freq: Two times a day (BID) | ORAL | 0 refills | Status: DC

## 2020-03-18 NOTE — Telephone Encounter (Signed)
I called patient to get her scheduled with one of the spine surgeon and she said no that she did not want to have surgery, states she is allergic to anesthesia she is not going under the knife, she states that she will keep her appt on 03/23/20

## 2020-03-19 ENCOUNTER — Ambulatory Visit: Payer: Self-pay | Admitting: *Deleted

## 2020-03-19 ENCOUNTER — Other Ambulatory Visit: Payer: Self-pay | Admitting: Family Medicine
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
## 2020-03-19 NOTE — Telephone Encounter (Signed)
Patient is calling to report she has lost the use of her R hand and arm- patient states she has fallen 5 times today. Advised ED per protocol- she states EMS were called yesterday- but she did not go to hospital due to fear of COVID and family entering her home/ Patient advised she neds to be evaluated and ED is the best place- she refuses to go to ED- she wants appointment in office. Patient advised I would send message to PCP. Reason for Disposition . [1] Weakness (i.e., paralysis, loss of muscle strength) of the face, arm / hand, or leg / foot on one side of the body AND [2] sudden onset AND [3] present now (Exception: Bell's palsy suspected [i.e., weakness only one side of the face, developing over hours to days, no other symptoms])  Answer Assessment - Initial Assessment Questions 1. SYMPTOM: "What is the main symptom you are concerned about?" (e.g., weakness, numbness)     R arm- patient is able to left arm- but unable to hold onto items- unable to move fingers 2. ONSET: "When did this start?" (minutes, hours, days; while sleeping)     Last night 3. LAST NORMAL: "When was the last time you were normal (no symptoms)?"     yesterday 4. PATTERN "Does this come and go, or has it been constant since it started?"  "Is it present now?"     constant 5. CARDIAC SYMPTOMS: "Have you had any of the following symptoms: chest pain, difficulty breathing, palpitations?"     no 6. NEUROLOGIC SYMPTOMS: "Have you had any of the following symptoms: headache, dizziness, vision loss, double vision, changes in speech, unsteady on your feet?"     Unsteady on feet- 5 times today 7. OTHER SYMPTOMS: "Do you have any other symptoms?"     No other symptoms 8. PREGNANCY: "Is there any chance you are pregnant?" "When was your last menstrual period?"     n/a  Protocols used: NEUROLOGIC DEFICIT-A-AH

## 2020-03-19 NOTE — Telephone Encounter (Signed)
She needs to go to the ED

## 2020-03-20 NOTE — Telephone Encounter (Signed)
Called pt stated she did not go to the hospital despite medical advice and that she will [facility].Stated she does not want to go to the hospital due to fear of [place]. Offered to call EMS. Pt declines stated she does not want to be taken to the hospital and that she  will only go  to [facility] tomorrow. Stressed pt the importance to be seen immediately in the ED based on the symptoms expressed to the triage RN. When asked if still experiencing any of these symptoms pt stated feels same with limited sensation of the arms and keep on drooping things. Pt verbalized understanding of the gravity of expressed symptoms and still refuses to be seen ASAP and be further evaluated.

## 2020-03-23 ENCOUNTER — Telehealth: Payer: Self-pay | Admitting: Family Medicine

## 2020-03-23 ENCOUNTER — Encounter (HOSPITAL_COMMUNITY): Payer: Self-pay | Admitting: Psychiatry

## 2020-03-23 ENCOUNTER — Ambulatory Visit: Payer: 59 | Admitting: Family Medicine

## 2020-03-23 ENCOUNTER — Ambulatory Visit (HOSPITAL_COMMUNITY): Payer: Medicaid Other | Admitting: Psychiatry

## 2020-03-23 ENCOUNTER — Other Ambulatory Visit: Payer: Self-pay

## 2020-03-23 DIAGNOSIS — F250 Schizoaffective disorder, bipolar type: Secondary | ICD-10-CM

## 2020-03-23 MED ORDER — CHLORZOXAZONE 250 MG PO TABS: 250.0000 mg | ORAL_TABLET | Freq: Four times a day (QID) | ORAL | 1 refills | Status: DC | PRN

## 2020-03-23 NOTE — Telephone Encounter (Signed)
Tried calling the patient to advise her of this - no answer. Will try again in the morning (is now 5:16 pm).

## 2020-03-23 NOTE — Progress Notes (Signed)
Counselor called and left vm for Client to return call to join the session. Client never returned call.   Lise Auer, LCSW

## 2020-03-23 NOTE — Telephone Encounter (Signed)
She missed her appointment today due to transportation issues. Rescheduled for the 15th for her lower back pain. Please advise.

## 2020-03-23 NOTE — Telephone Encounter (Signed)
Patient called requesting a stronger pain medication. Patient states she has severe pains and is asking for medication to be called in today. Please send medication to [facility] on [address] [place]. Patient phone number is [phone].

## 2020-03-23 NOTE — Telephone Encounter (Signed)
New Rx sent for a different muscle relaxant.  Can refer her to a pain clinic if she'd like stronger meds.

## 2020-03-24 NOTE — Telephone Encounter (Signed)
Tried calling the patient again and still no answer. She should have been notified of the new medication by the pharmacy already. Will address further at her upcoming office visit.

## 2020-03-29 ENCOUNTER — Other Ambulatory Visit: Payer: Self-pay

## 2020-03-29 ENCOUNTER — Ambulatory Visit: Payer: 59 | Attending: Family Medicine | Admitting: Family Medicine

## 2020-03-30 ENCOUNTER — Other Ambulatory Visit: Payer: Self-pay

## 2020-03-30 ENCOUNTER — Ambulatory Visit (HOSPITAL_COMMUNITY): Payer: Medicaid Other | Admitting: Psychiatry

## 2020-03-30 ENCOUNTER — Ambulatory Visit: Payer: 59 | Attending: Family Medicine | Admitting: Family Medicine

## 2020-03-30 ENCOUNTER — Ambulatory Visit: Payer: 59 | Admitting: Family Medicine

## 2020-03-30 DIAGNOSIS — M5416 Radiculopathy, lumbar region: Secondary | ICD-10-CM

## 2020-03-30 DIAGNOSIS — M109 Gout, unspecified: Secondary | ICD-10-CM

## 2020-03-30 DIAGNOSIS — F250 Schizoaffective disorder, bipolar type: Secondary | ICD-10-CM | POA: Diagnosis not present

## 2020-03-30 DIAGNOSIS — R1900 Intra-abdominal and pelvic swelling, mass and lump, unspecified site: Secondary | ICD-10-CM | POA: Diagnosis not present

## 2020-03-30 DIAGNOSIS — N1831 Chronic kidney disease, stage 3a: Secondary | ICD-10-CM

## 2020-03-30 MED ORDER — FEBUXOSTAT 40 MG PO TABS: 40.0000 mg | ORAL_TABLET | Freq: Every day | ORAL | 0 refills | Status: DC

## 2020-03-30 MED ORDER — ATORVASTATIN CALCIUM 20 MG PO TABS: 20.0000 mg | ORAL_TABLET | Freq: Every day | ORAL | 1 refills | Status: DC

## 2020-03-30 MED ORDER — DICLOFENAC SODIUM 1 % EX GEL: 4.0000 g | Freq: Four times a day (QID) | CUTANEOUS | 0 refills | Status: DC

## 2020-03-30 MED ORDER — ATORVASTATIN CALCIUM 80 MG PO TABS: 80.0000 mg | ORAL_TABLET | Freq: Every day | ORAL | 2 refills | Status: DC
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
## 2020-03-30 NOTE — Progress Notes (Signed)
States that cyst in her uterus is getting bigger. Having back pain.

## 2020-04-05 ENCOUNTER — Telehealth (HOSPITAL_COMMUNITY): Payer: 59 | Admitting: Psychiatry

## 2020-04-05 ENCOUNTER — Telehealth: Payer: Self-pay | Admitting: Family Medicine

## 2020-04-05 ENCOUNTER — Other Ambulatory Visit: Payer: Self-pay

## 2020-04-05 ENCOUNTER — Ambulatory Visit: Payer: 59 | Admitting: Family Medicine

## 2020-04-05 NOTE — Telephone Encounter (Signed)
Copied from [place] [id number]. Topic: General - Inquiry >> Apr 05, 2020 11:53 AM Greggory Keen D wrote: Reason for CRM: pt called upset asking if Dr. Margarita Rana will get her a nurse aid to stay with her.  She says she can not move well and that she is afraid to stay by herself.  [phone]

## 2020-04-07 ENCOUNTER — Telehealth (HOSPITAL_COMMUNITY): Payer: Self-pay | Admitting: *Deleted
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
## 2020-04-08 ENCOUNTER — Telehealth: Payer: Self-pay | Admitting: Family Medicine

## 2020-04-08 NOTE — Telephone Encounter (Signed)
Copied from [place] [id number]. Topic: General - Other >> Apr 06, 2020  3:28 PM Tessa Lerner A wrote: Reason for CRM: Patient would like to be contacted regarding home health assistance  Patient is having difficulty with mobility and uncomfortable leaving their home at the current time  Patient would like to discuss their assistance options with a Education officer, museum as well  Please contact to advise further

## 2020-04-08 NOTE — Telephone Encounter (Signed)
Attempted to contact patient # [phone] regarding home health assistance.  The message on the phone said to enter remote access code, no option to leave a voicemail message. In addition to discussing her questions, need to inform her that this CM spoke to [facility]  02/20/2020 and was informed that she is already receiving the maximum amount of services.

## 2020-04-12 ENCOUNTER — Telehealth: Payer: Self-pay

## 2020-04-12 ENCOUNTER — Ambulatory Visit: Payer: Self-pay | Admitting: *Deleted

## 2020-04-12 ENCOUNTER — Other Ambulatory Visit (HOSPITAL_COMMUNITY): Payer: Self-pay | Admitting: Psychiatry
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
## 2020-04-12 NOTE — Telephone Encounter (Signed)
Pt returning call to [facility]. Please advise.

## 2020-04-12 NOTE — Telephone Encounter (Signed)
If she does get a service dog I can provide her a letter that support her cause.  We will need to assess the need for power wheelchair at an in person office visit.Thanks.
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
## 2020-04-13 ENCOUNTER — Other Ambulatory Visit: Payer: Self-pay

## 2020-04-13 ENCOUNTER — Ambulatory Visit (HOSPITAL_COMMUNITY): Payer: 59 | Admitting: Psychiatry

## 2020-04-13 ENCOUNTER — Telehealth (INDEPENDENT_AMBULATORY_CARE_PROVIDER_SITE_OTHER): Payer: 59 | Admitting: Psychiatry

## 2020-04-13 ENCOUNTER — Telehealth: Payer: Self-pay

## 2020-04-13 DIAGNOSIS — F250 Schizoaffective disorder, bipolar type: Secondary | ICD-10-CM | POA: Diagnosis not present

## 2020-04-13 MED ORDER — LISDEXAMFETAMINE DIMESYLATE 20 MG PO CAPS: 20.0000 mg | ORAL_CAPSULE | Freq: Every day | ORAL | 0 refills | Status: DC

## 2020-04-13 MED ORDER — TOPIRAMATE 25 MG PO TABS: 25.0000 mg | ORAL_TABLET | Freq: Two times a day (BID) | ORAL | 2 refills | Status: DC
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
## 2020-04-13 NOTE — Telephone Encounter (Signed)
Pt is returning [place] call

## 2020-04-13 NOTE — Progress Notes (Signed)
[place] MD/PA/NP OP Progress Note  04/13/2020 10:46 AM Tiffany [name]  MRN:  [id number] Interview was conducted by phone and I verified that I was speaking with the correct person using two identifiers. I discussed the limitations of evaluation and management by telemedicine and  the availability of in person appointments. Patient expressed understanding and agreed to proceed. Participants in the visit: patient (location - home); physician (location - home office).  Chief Complaint: Still some paranoia.  HPI: 60yo divorced AAF with schizoaffective disorder bipolar type(vs paranoid schizophrenia),chronicPTSDand bingeeating disorder.She tried ziprazidone but developedQTC prolongation on itso it was changed toSaphriswhich she believes is a best medication she has ever been on.She had also been prescribed risperidone, aripiprazole, lurasidone in the past.[name] also on escitalopram andwas onVyvanse for binge eating disorder. Tiffany [name] used to weigh over 300 lbs and since starting Vyvanse her binge eating is well controlled and she was able to lose a lot of weight). She continues to take diazepam 10 mgtidand trazodone for sleep (100 mg plus 50 mg in am "for nerves").She has a strained relationship with her family - mother in particular, but also son. She has an independent apartmentasof November 2021.She admits tofeelingmildydepressedandanxious with some paranoia.We havetried to cross taper it tobrexpiprazole but sheshe felt more paranoid and stayed on genericSaphris with minimal benefit.We have stopped brexpiprazole and increased dose of Saphris (brand) to 10 mg tid. She remained paranoid feeling unsafe living alone in her apartment. Tiffany [name] believed someone (likely inspired by her son) is getting in there and stealing her clothes.She apparently barricaded self there, was not eating and eventually was brought by AS to hospital on 02/18/20. She was found to have AKF, UTI, delirium. She stayed  on medical floor until 02/26/20 and was seen in consultation by psychiatry. Her electrolytes normalized, mental staus improved. [name] back at home still mildly paranoid and anxious but admittedly in better state of mind. Her escitalopram was held due to hyponatremia, dose of Vyvanse and prazosin decreased. I have restarted escitalopram at a lower 20 mg dose and added topiramate to help control appetite (also for mood). She has resumed sessions with Lise Auer..    Visit Diagnosis:    ICD-10-CM   1. Schizoaffective disorder, bipolar type ([place])  F25.0     Past Psychiatric History: Please see intake H&P.  Past Medical History:  Past Medical History:  Diagnosis Date  . Agitation 11/22/2017  . Anoxic brain injury ([place]) 09/08/2016   C. Arrest due to respiratory failure and COPD exacerbation  . Anxiety   . Arthritis    "all over" (04/10/2016)  . Asthma 10/18/2010  . Binge eating disorder   . Cardiac arrest ([place]) 09/08/2016   PEA  . Carotid artery stenosis    1-39% bilateral by dopplers 11/2016  . Chronic diastolic (congestive) heart failure ([place])   . Chronic kidney disease    "I see a kidney dr." (04/10/2016)  . Chronic pain syndrome 06/18/2012  . Chronic post-traumatic stress disorder (PTSD) 05/27/2018  . Chronic respiratory failure with hypoxia and hypercapnia (HCC) 06/22/2015   TRILOGY Vent >AVAPA-ES., Vt target 200-400, Max P 30 , PS max 20 , PS min 6-10 , E Max 6, E Min 4, Rate Auto AVAPS Rate 2 (titrate for pt comfort) , bleed O2 at 5l/m continuous flow .   [name] CKD (chronic kidney disease) stage 3, GFR 30-59 ml/min (HCC) 12/15/2016  . Closed displaced fracture of fifth metacarpal bone 03/21/2018  . Cocaine use disorder, severe, in sustained remission ([place])  12/17/2015  . Complication of anesthesia    decreased bp, decreased heart rate  . COPD (chronic obstructive pulmonary disease) ([place]) 07/08/2014  . Depression   . Diabetic neuropathy ([place]) 04/24/2011  . Difficulty with speech 01/24/2018   . Disorder of nervous system   . Drug abuse ([place]) 11/21/2017  . Dyslipidemia 04/24/2011  . Elevated troponin 04/28/2012  . Emphysema   . Encephalopathy 11/21/2017  . Essential hypertension 03/22/2016  . Fibula fracture 07/10/2016  . Frequent falls 10/11/2017  . GERD (gastroesophageal reflux disease)   . Gout 04/11/2017  . Heart attack ([place]) 1980s  . History of blood transfusion 1994   "couldn't stop bleeding from my period"  . History of drug abuse in remission ([place]) 11/28/2015   Quit in 2017  . Hyperlipidemia LDL goal <70   . Incontinence   . Manic depression ([place])   . Morbid obesity ([place]) 10/18/2010  . Obstructive sleep apnea 10/18/2010  . On home oxygen therapy    "6L; 24/7" (04/10/2016)  . OSA on CPAP    "wear mask sometimes" (04/10/2016)  . Paranoid ([place])    "sometimes; I'm on RX for it" (04/10/2016)  . Prolonged Q-T interval on ECG   . Rectal bleeding 12/31/2015  . Right carotid bruit 11/09/2016  . Schizoaffective disorder, bipolar type ([place]) 04/05/2018  . Seasonal allergies   . Seborrheic keratoses 12/31/2013  . Seizures ([place])    "don't know what kind; last one was ~ 1 yr ago" (04/10/2016)  . Stroke [facility]) 1980s   denies residual on 04/10/2016  . Thrush 09/19/2013  . Type 2 diabetes mellitus ([place]) 10/18/2010    Past Surgical History:  Procedure Laterality Date  . CESAREAN SECTION  1997  . HERNIA REPAIR    . IR CHOLANGIOGRAM EXISTING TUBE  07/20/2016  . IR PERC CHOLECYSTOSTOMY  05/10/2016  . IR RADIOLOGIST EVAL & MGMT  06/08/2016  . IR RADIOLOGIST EVAL & MGMT  06/29/2016  . IR SINUS/FIST TUBE CHK-NON GI  07/12/2016  . RIGHT/LEFT HEART CATH AND CORONARY ANGIOGRAPHY N/A 06/19/2017   Procedure: RIGHT/LEFT HEART CATH AND CORONARY ANGIOGRAPHY;  Surgeon: Jolaine Artist, MD;  Location: [place] [facility];  Service: Cardiovascular;  Laterality: N/A;  . TIBIA IM NAIL INSERTION Right 07/12/2016   Procedure: INTRAMEDULLARY (IM) NAIL RIGHT TIBIA;  Surgeon: Leandrew Koyanagi, MD;  Location: [place];   Service: Orthopedics;  Laterality: Right;  . UMBILICAL HERNIA REPAIR  ~ 1963   "that's why I don't have a belly button"  . VAGINAL HYSTERECTOMY      Family Psychiatric History: Reviewed.  Family History:  Family History  Problem Relation Age of Onset  . Cancer Father        prostate  . Cancer Mother        lung  . Depression Mother   . Depression Sister   . Anxiety disorder Sister   . Schizophrenia Sister   . Bipolar disorder Sister   . Depression Sister   . Depression Brother   . Heart failure Other        cousin    Social History:  Social History   Socioeconomic History  . Marital status: Widowed    Spouse name: Not on file  . Number of children: 3  . Years of education: Not on file  . Highest education level: Not on file  Occupational History  . Occupation: disabled    Comment: factory production  Tobacco Use  . Smoking status: Former Smoker  Packs/day: 1.50    Years: 38.00    Pack years: 57.00    Types: Cigarettes    Start date: 03/13/1977    Quit date: 04/10/2016    Years since quitting: 4.0  . Smokeless tobacco: Never Used  Vaping Use  . Vaping Use: Never used  Substance and Sexual Activity  . Alcohol use: No    Alcohol/week: 0.0 standard drinks  . Drug use: No    Types: Cocaine    Comment: 04/10/2016 "last used cocaine back in November 2017"  . Sexual activity: Not Currently    Birth control/protection: Surgical  Other Topics Concern  . Not on file  Social History Narrative   Has 1 son, [name]   Lives with son and his boyfriend   Her house has ramps and handrails should she ever needs them.    Her mother lives down the street from her and is a good support person in addition to her son.   She drives herself, has private transportation.    Cocaine free since 02/24/16, smoke free since 04/10/16   Social Determinants of Health   Financial Resource Strain: Not on file  Food Insecurity: Not on file  Transportation Needs: Not on file  Physical  Activity: Not on file  Stress: Not on file  Social Connections: Not on file    Allergies:  Allergies  Allergen Reactions  . Hydrocodone Shortness Of Breath  . Hydrocodone-Acetaminophen Shortness Of Breath  . Hydroxyzine Anaphylaxis and Shortness Of Breath  . Latuda [Lurasidone Hcl] Anaphylaxis  . Lurasidone Anaphylaxis  . Magnesium-Containing Compounds Anaphylaxis  . Prednisone Anaphylaxis, Swelling and Other (See Comments)    Tongue swelling  . Tramadol Anaphylaxis and Swelling  . Codeine Nausea And Vomiting  . Other Rash  . Sulfa Antibiotics Itching  . Tape Rash    Metabolic Disorder Labs: Lab Results  Component Value Date   HGBA1C 4.8 02/20/2020   MPG 91.06 02/20/2020   MPG 125.5 01/07/2017   No results found for: PROLACTIN Lab Results  Component Value Date   CHOL 127 08/05/2018   TRIG 53 08/05/2018   HDL 62 08/05/2018   CHOLHDL 2.0 08/05/2018   VLDL 38 01/07/2017   LDLCALC 54 08/05/2018   LDLCALC 56 01/07/2017   Lab Results  Component Value Date   TSH 0.358 02/18/2020   TSH 1.233 10/27/2019    Therapeutic Level Labs: No results found for: LITHIUM Lab Results  Component Value Date   VALPROATE 20 (L) 09/09/2016   VALPROATE 46 (L) 07/10/2016   No components found for:  CBMZ  Current Medications: Current Outpatient Medications  Medication Sig Dispense Refill  . albuterol (PROVENTIL) (2.5 MG/3ML) 0.083% nebulizer solution Take 3 mLs (2.5 mg total) by nebulization every 6 (six) hours as needed for wheezing or shortness of breath. 360 mL 5  . albuterol (VENTOLIN HFA) 108 (90 Base) MCG/ACT inhaler Inhale 2 puffs into the lungs every 6 (six) hours as needed for wheezing or shortness of breath. 18 g 5  . amLODipine (NORVASC) 10 MG tablet Take 1 tablet (10 mg total) by mouth daily.    [name] atorvastatin (LIPITOR) 20 MG tablet Take 1 tablet (20 mg total) by mouth daily. 90 tablet 1  . Blood Pressure Monitoring (BLOOD PRESSURE CUFF) MISC 1 Units by Does not apply  route daily. 1 each 0  . chlorzoxazone (PARAFON FORTE) 250 MG tablet Take 1 tablet (250 mg total) by mouth 4 (four) times daily as needed for muscle spasms. 60 tablet  1  . colchicine 0.6 MG tablet Take 1 tablet (0.6 mg total) by mouth 2 (two) times daily. 180 tablet 0  . cyclobenzaprine (FLEXERIL) 10 MG tablet Take 1 tablet (10 mg total) by mouth 2 (two) times daily as needed for muscle spasms. 60 tablet 3  . diazepam (VALIUM) 10 MG tablet Take 1 tablet (10 mg total) by mouth 3 (three) times daily as needed for anxiety. 90 tablet 2  . diclofenac Sodium (VOLTAREN) 1 % GEL Apply 4 g topically 4 (four) times daily. 100 g 0  . Elastic Bandages & Supports (MEDICAL COMPRESSION STOCKINGS) MISC 1 Units by Does not apply route daily. 1 each 1  . escitalopram (LEXAPRO) 20 MG tablet Take 1 tablet (20 mg total) by mouth daily. 30 tablet 2  . febuxostat (ULORIC) 40 MG tablet Take 1 tablet (40 mg total) by mouth daily. 90 tablet 0  . feeding supplement (ENSURE ENLIVE / ENSURE PLUS) LIQD Take 237 mLs by mouth 2 (two) times daily between meals.    . ferrous sulfate 325 (65 FE) MG tablet Take 1 tablet (325 mg total) by mouth daily with breakfast. 180 tablet 0  . Fluticasone-Umeclidin-Vilant (TRELEGY ELLIPTA) 100-62.5-25 MCG/INH AEPB Inhale 1 puff into the lungs daily. 60 each 3  . folic acid (FOLVITE) 1 MG tablet Take 1 tablet (1 mg total) by mouth daily.    [name] gabapentin (NEURONTIN) 600 MG tablet Take 0.5 tablets (300 mg total) by mouth daily.    . hydrALAZINE (APRESOLINE) 50 MG tablet Take 1 tablet (50 mg total) by mouth 2 (two) times daily.    [name] lisdexamfetamine (VYVANSE) 20 MG capsule Take 1 capsule (20 mg total) by mouth daily with breakfast. 30 capsule 0  . montelukast (SINGULAIR) 10 MG tablet Take 1 tablet (10 mg total) by mouth at bedtime. 30 tablet 11  . Multiple Vitamin (MULTIVITAMIN WITH MINERALS) TABS tablet Take 1 tablet by mouth daily.    [name] omeprazole (PRILOSEC) 40 MG capsule Take 1 capsule (40 mg  total) by mouth daily. 90 capsule 1  . prazosin (MINIPRESS) 2 MG capsule Take 1 capsule (2 mg total) by mouth at bedtime. 90 capsule 0  . SAPHRIS 10 MG SUBL Place 1 tablet (10 mg total) under the tongue in the morning, at noon, and at bedtime. 90 tablet 2  . thiamine 100 MG tablet Take 1 tablet (100 mg total) by mouth daily.    [name] topiramate (TOPAMAX) 25 MG tablet Take 1 tablet (25 mg total) by mouth 2 (two) times daily. 60 tablet 2  . traZODone (DESYREL) 100 MG tablet Take 1 tablet (100 mg total) by mouth at bedtime.     Current Facility-Administered Medications  Medication Dose Route Frequency Provider Last Rate Last Admin  . betamethasone acetate-betamethasone sodium phosphate (CELESTONE) injection 6 mg  6 mg Other Once Magnus Sinning, MD           Psychiatric Specialty Exam: Review of Systems  Psychiatric/Behavioral: The patient is nervous/anxious.   All other systems reviewed and are negative.   There were no vitals taken for this visit.There is no height or weight on file to calculate BMI.  General Appearance: NA  Eye Contact:  NA  Speech:  Clear and Coherent and Normal Rate  Volume:  Normal  Mood:  Anxious  Affect:  NA  Thought Process:  Goal Directed  Orientation:  Full (Time, Place, and Person)  Thought Content: Paranoid Ideation   Suicidal Thoughts:  No  Homicidal Thoughts:  No  Memory:  Immediate;   Fair Recent;   Good Remote;   Good  Judgement:  Fair  Insight:  Fair  Psychomotor Activity:  NA  Concentration:  Concentration: Fair  Recall:  [place] of Knowledge: Good  Language: Good  Akathisia:  Negative  Handed:  Right  AIMS (if indicated): not done  Assets:  Desire for Improvement Financial Resources/Insurance Housing Resilience  ADL's:  Intact  Cognition: WNL  Sleep:  Fair   Screenings: AUDIT   Flowsheet Row Admission (Discharged) from 12/01/2015 in [facility]  Alcohol Use Disorder Identification Test Final Score (AUDIT)  0    GAD-7   Flowsheet Row Office Visit from 03/21/2018 in [place] [address] for [facility]  Total GAD-7 Score 4    PHQ2-9   [place] Visit from confidential encounter on 12/11/2018 Office Visit from confidential encounter on 12/10/2018 Office Visit from confidential encounter on 11/18/2018 Office Visit from confidential encounter on 11/08/2018 Telemedicine from confidential encounter on 10/25/2018  PHQ-2 Total Score 0 4 2 1 4   PHQ-9 Total Score -- 10 8 1 16     Flowsheet Row ED to Hosp-Admission (Discharged) from 02/18/2020 in [facility] ED from 02/03/2019 in [place] DEPT ED from 10/06/2018 in [facility] DEPT  C-SSRS RISK CATEGORY Error: Q6 is Yes, you must answer 7 Error: Q6 is Yes, you must answer 7 No Risk       Assessment and Plan: 59yo divorced AAF with schizoaffective disorder bipolar type(vs paranoid schizophrenia),chronicPTSDand bingeeating disorder.She tried ziprazidone but developedQTC prolongation on itso it was changed toSaphriswhich she believes is a best medication she has ever been on.She had also been prescribed risperidone, aripiprazole, lurasidone in the past.[name] also on escitalopram andwas onVyvanse for binge eating disorder. Betzaida used to weigh over 300 lbs and since starting Vyvanse her binge eating is well controlled and she was able to lose a lot of weight). She continues to take diazepam 10 mgtidand trazodone for sleep (100 mg plus 50 mg in am "for nerves").She has a strained relationship with her family - mother in particular, but also son. She has an independent apartmentasof November 2021.She admits tofeelingmildydepressedandanxious with some paranoia.We havetried to cross taper it tobrexpiprazole but sheshe felt more paranoid and stayed on genericSaphris with minimal benefit.We have stopped brexpiprazole and increased dose  of Saphris (brand) to 10 mg tid. She remained paranoid feeling unsafe living alone in her apartment. Tiffany [name] believed someone (likely inspired by her son) is getting in there and stealing her clothes.She apparently barricaded self there, was not eating and eventually was brought by AS to hospital on 02/18/20. She was found to have AKF, UTI, delirium. She stayed on medical floor until 02/26/20 and was seen in consultation by psychiatry. Her electrolytes normalized, mental staus improved. [name] back at home still mildly paranoid and anxious but admittedly in better state of mind. Her escitalopram was held due to hyponatremia, dose of Vyvanse and prazosin decreased. I have restarted escitalopram at a lower 20 mg dose and added topiramate to help control appetite (also for mood). She has resumed sessions with Lise Auer.[name]  Dx: Schizoaffective disorder bipolar type  Plan: Continueasenapine (brand) 10 mg tid,trazodone100 mg prn sleep,prazosin 2 mg at HS, gabapentin 600 bidand diazepam 10 mg tid,escitalopram20 mg (was on 40 mg) daily, Vyvanse 20 mg and topiramate 25 mg bid for binge eating. We have rerferred her to more intense CST services  but  There has been no response. Next appointment in2-3 weeks - she is requesting a female provider if possible. The plan was discussed with patient who had an opportunity to ask questions and these were all answered.I spend88minutes in phone consultation with the patient.   Stephanie Acre, MD 04/13/2020, 10:46 AM

## 2020-04-14 NOTE — Telephone Encounter (Signed)
Attempted to contact patient to inform her that she will need an in office assessment if she is interested in a power chair and that Dr Margarita Rana can provide or a letter to support her need for a service dog if she gets the dog.   Call placed to #  [phone] twice and the phone just rings, no option to leave a message. Call placed to # [phone] and the message stated that the phone is not in service.

## 2020-04-14 NOTE — Telephone Encounter (Signed)
Pt was called and phone got disconnected, please call patient and refer her to mobile unit or set her up with a virtual visit with a provider.

## 2020-04-15 ENCOUNTER — Ambulatory Visit: Payer: Self-pay | Admitting: *Deleted

## 2020-04-15 ENCOUNTER — Telehealth: Payer: Self-pay

## 2020-04-15 ENCOUNTER — Telehealth: Payer: Self-pay | Admitting: Family Medicine

## 2020-04-15 NOTE — Telephone Encounter (Signed)
Pt states that she would like to use the full 130 hours. Please advise.

## 2020-04-15 NOTE — Telephone Encounter (Signed)
Note Pt again calling, states she missed call from [facility] "because they cut my calls off."  States she does not want to discuss much now because they listen to her calls. Asked  pt if she was in immediate danger states "I always am because they break in and paralyze me with drugs." States no one present "But they live in my attic."   Pt is requesting PCP call police for her "So something will be done." States they don't help when she calls."Might be better is she calls."  Pt  hyperverbal, evasive. Please advise: [phone]

## 2020-04-15 NOTE — Telephone Encounter (Signed)
Pt requested a call back to further discuss [phone].

## 2020-04-15 NOTE — Telephone Encounter (Signed)
Pt calling and is requesting to have a [facility]. She states that it has been 30 days since she was able to see her last one. Pt states that she contacted [facility] and they are needing to have PCP fill out  paperwork to have the pt reevaluated for the aid. Please advise.

## 2020-04-15 NOTE — Telephone Encounter (Signed)
Pt again calling, states she missed call from [facility] "because they cut my calls off."  States she does not want to discuss much now because they listen to her calls. Asked  pt if she was in immediate danger states "I always am because they break in and paralyze me with drugs." States no one present "But they live in my attic."   Pt is requesting PCP call police for her "So something will be done." States they don't help when she calls."Might be better is she calls."  Pt  hyperverbal, evasive. Please advise: [phone]

## 2020-04-15 NOTE — Telephone Encounter (Signed)
Opened chart to answer a question for the agent.  Did not need to talk with pt as Tiffany [name] from [facility] is contacting the pt now per a message to the agent just now from [place].
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
## 2020-04-15 NOTE — Telephone Encounter (Signed)
Call received from patient. She had questions about her home health aides. Provided her with the phone number for [facility].  Regarding the request for the power chair, informed her that Dr Margarita Rana would need to do an in person eval for the power chair and scheduled her for an appt 05/31/2020.

## 2020-04-19 ENCOUNTER — Telehealth (HOSPITAL_COMMUNITY): Payer: Self-pay | Admitting: *Deleted

## 2020-04-19 NOTE — Telephone Encounter (Signed)
Prior authorization requested for Saphris.  Awaiting determination.
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
## 2020-04-19 NOTE — Telephone Encounter (Signed)
Called Pt no answer left vm

## 2020-04-19 NOTE — Procedures (Signed)
Lumbar Epidural Steroid Injection - Interlaminar Approach with Fluoroscopic Guidance  Patient: Tiffany [name]      Date of Birth: 11/01/1960 MRN: [id number] PCP: [name], MD      Visit Date: 03/10/2020   Universal Protocol:     Consent Given By: the patient  Position: PRONE  Additional Comments: Vital signs were monitored before and after the procedure. Patient was prepped and draped in the usual sterile fashion. The correct patient, procedure, and site was verified.   Injection Procedure Details:   Procedure diagnoses: Lumbar radiculopathy [M54.16]   Meds Administered:  Meds ordered this encounter  Medications  . DISCONTD: betamethasone acetate-betamethasone sodium phosphate (CELESTONE) injection 12 mg  . betamethasone acetate-betamethasone sodium phosphate (CELESTONE) injection 6 mg     Laterality: Left  Location/Site:  L4-L5  Needle: 4.5 in., 20 ga. Tuohy  Needle Placement: Paramedian epidural  Findings:   -Comments: Excellent flow of contrast into the epidural space.  Procedure Details: Using a paramedian approach from the side mentioned above, the region overlying the inferior lamina was localized under fluoroscopic visualization and the soft tissues overlying this structure were infiltrated with 4 ml. of 1% Lidocaine without Epinephrine. The Tuohy needle was inserted into the epidural space using a paramedian approach.   The epidural space was localized using loss of resistance along with counter oblique bi-planar fluoroscopic views.  After negative aspirate for air, blood, and CSF, a 2 ml. volume of Isovue-250 was injected into the epidural space and the flow of contrast was observed. Radiographs were obtained for documentation purposes.    The injectate was administered into the level noted above.   Additional Comments:  No complications occurred Dressing: 2 x 2 sterile gauze and Band-Aid    Post-procedure details: Patient was observed during the  procedure. Post-procedure instructions were reviewed.  Patient left the clinic in stable condition.

## 2020-04-20 ENCOUNTER — Ambulatory Visit (HOSPITAL_COMMUNITY): Payer: 59 | Admitting: Psychiatry

## 2020-04-20 ENCOUNTER — Ambulatory Visit: Payer: 59 | Admitting: Emergency Medicine

## 2020-04-20 ENCOUNTER — Telehealth (HOSPITAL_COMMUNITY): Payer: Self-pay | Admitting: *Deleted

## 2020-04-20 ENCOUNTER — Other Ambulatory Visit (HOSPITAL_COMMUNITY): Payer: Self-pay | Admitting: *Deleted

## 2020-04-20 ENCOUNTER — Other Ambulatory Visit: Payer: Self-pay | Admitting: Family Medicine

## 2020-04-20 ENCOUNTER — Other Ambulatory Visit: Payer: Self-pay

## 2020-04-20 DIAGNOSIS — E1142 Type 2 diabetes mellitus with diabetic polyneuropathy: Secondary | ICD-10-CM

## 2020-04-20 MED ORDER — ASENAPINE MALEATE 10 MG SL SUBL: 10.0000 mg | SUBLINGUAL_TABLET | Freq: Two times a day (BID) | SUBLINGUAL | 0 refills | Status: DC

## 2020-04-20 MED ORDER — GABAPENTIN 300 MG PO CAPS: 300.0000 mg | ORAL_CAPSULE | Freq: Every day | ORAL | 0 refills | Status: DC

## 2020-04-20 NOTE — Telephone Encounter (Signed)
Pt asking for refill of the Neurontin which was last filled by inpt doctor. However Dr. Mamie Nick has filled this in the past. Pt has an upcoming appointment with [name] on 05/03/20 but she does not want to wait.

## 2020-04-20 NOTE — Telephone Encounter (Signed)
This is a patient of Dr Montel Culver.  I am sending this to you because I understand you are familiar with this patient.  This patient is on Saphris 10mg  TID.  We have exhausted all avenues to get this authorized.  Twice a day has been authorized until 01/15/2021.  Please review and advise.

## 2020-04-20 NOTE — Telephone Encounter (Signed)
Pt has been instructed multiple times to contact [facility] regarding her PCS services.

## 2020-04-20 NOTE — Telephone Encounter (Signed)
   Notes to clinic: Review for refill Medication was filled by a different provider    Requested Prescriptions  Pending Prescriptions Disp Refills   gabapentin (NEURONTIN) 600 MG tablet      Sig: Take 0.5 tablets (300 mg total) by mouth daily.      Neurology: Anticonvulsants - gabapentin Passed - 04/20/2020 12:08 PM      Passed - Valid encounter within last 12 months    Recent Outpatient Visits           3 weeks ago Pelvic mass in female   [place], [place], MD   3 months ago Overactive bladder   [facility], MD   1 year ago Refused assessment of physical health   [facility], NP   1 year ago Chronic pain syndrome   [facility], Delfino Lovett, NP   1 year ago Medication management   [facility], Delfino Lovett, NP       Future Appointments             Today Byrum, Rose Fillers, MD [facility]   In 1 month [name], MD [facility]

## 2020-04-20 NOTE — Telephone Encounter (Signed)
Pt called and reported that she is still waiting for correspondence from PCP, she says it has been longer than 30 days and anything longer than 30 days she has to get reassessed. Pt says her right side is not functioning properly. She says she needs this done asap. She does not want to be delayed any further. She says her "Aid is ready to start, and everyone is waiting on PCP"   Requesting 130 hours still.

## 2020-04-20 NOTE — Telephone Encounter (Signed)
done

## 2020-04-20 NOTE — Telephone Encounter (Signed)
I reviewed last discharge summary in February and she was told to take the gabapentin 300 mg a day.  I will write 300 mg a day and she need to discuss with [name] if medicine dose needs to be adjusted.  Prescription sent to [facility].

## 2020-04-20 NOTE — Telephone Encounter (Signed)
Medication Refill - Medication: gabapentin (NEURONTIN) 600 MG tablet   Has the patient contacted their pharmacy? No. (Agent: If no, request that the patient contact the pharmacy for the refill.) (Agent: If yes, when and what did the pharmacy advise?)  Preferred Pharmacy (with phone number or street name):  [facility] [id number] - [place], [place] - [address]  [place] [address]  Phone: [phone] Fax: [phone]     Agent: Please be advised that RX refills may take up to 3 business days. We ask that you follow-up with your pharmacy.

## 2020-04-20 NOTE — Telephone Encounter (Signed)
I will recommend to stay on Saphris 10 mg BID. FDA recommended not to go above 20 mg/day. Needs to monitor WBC/ANC count.

## 2020-04-22 ENCOUNTER — Telehealth: Payer: Self-pay | Admitting: Family Medicine

## 2020-04-22 NOTE — Telephone Encounter (Signed)
Copied from [address] [phone]. Topic: General - Other >> Apr 22, 2020 10:32 AM Tessa Lerner A wrote: Reason for CRM: Patient would like to be contacted by a member of staff regarding assistance  Patient has concerns for their wellbeing at home and would like to assisted  Please contact to further advise when possible

## 2020-04-22 NOTE — Telephone Encounter (Signed)
Reached out patient by phone. Care Coordinator will leave an envelope with local housing resources for patient at the [facility] for her to pick up. Patient mentions she will try to get by tomorrow.

## 2020-04-24 IMAGING — DX DG CHEST 2V
3 series · 3 of 3 positions shown · non-contrast
Comparison: 01/19/2017

CLINICAL DATA: Pt arrived via [REDACTED]; per EMS pt c/o SOB x 2 days;
pt has crackles in bilateral lobes; Pt has hx of fluid retention and
DM; pt on O2 at hm usually 6L. Patient reports mid and left sided
chest pressure x a few days as well. Hx of HTN and CAD.

EXAM:
CHEST - 2 VIEW

[chest lat (1 of 2)]
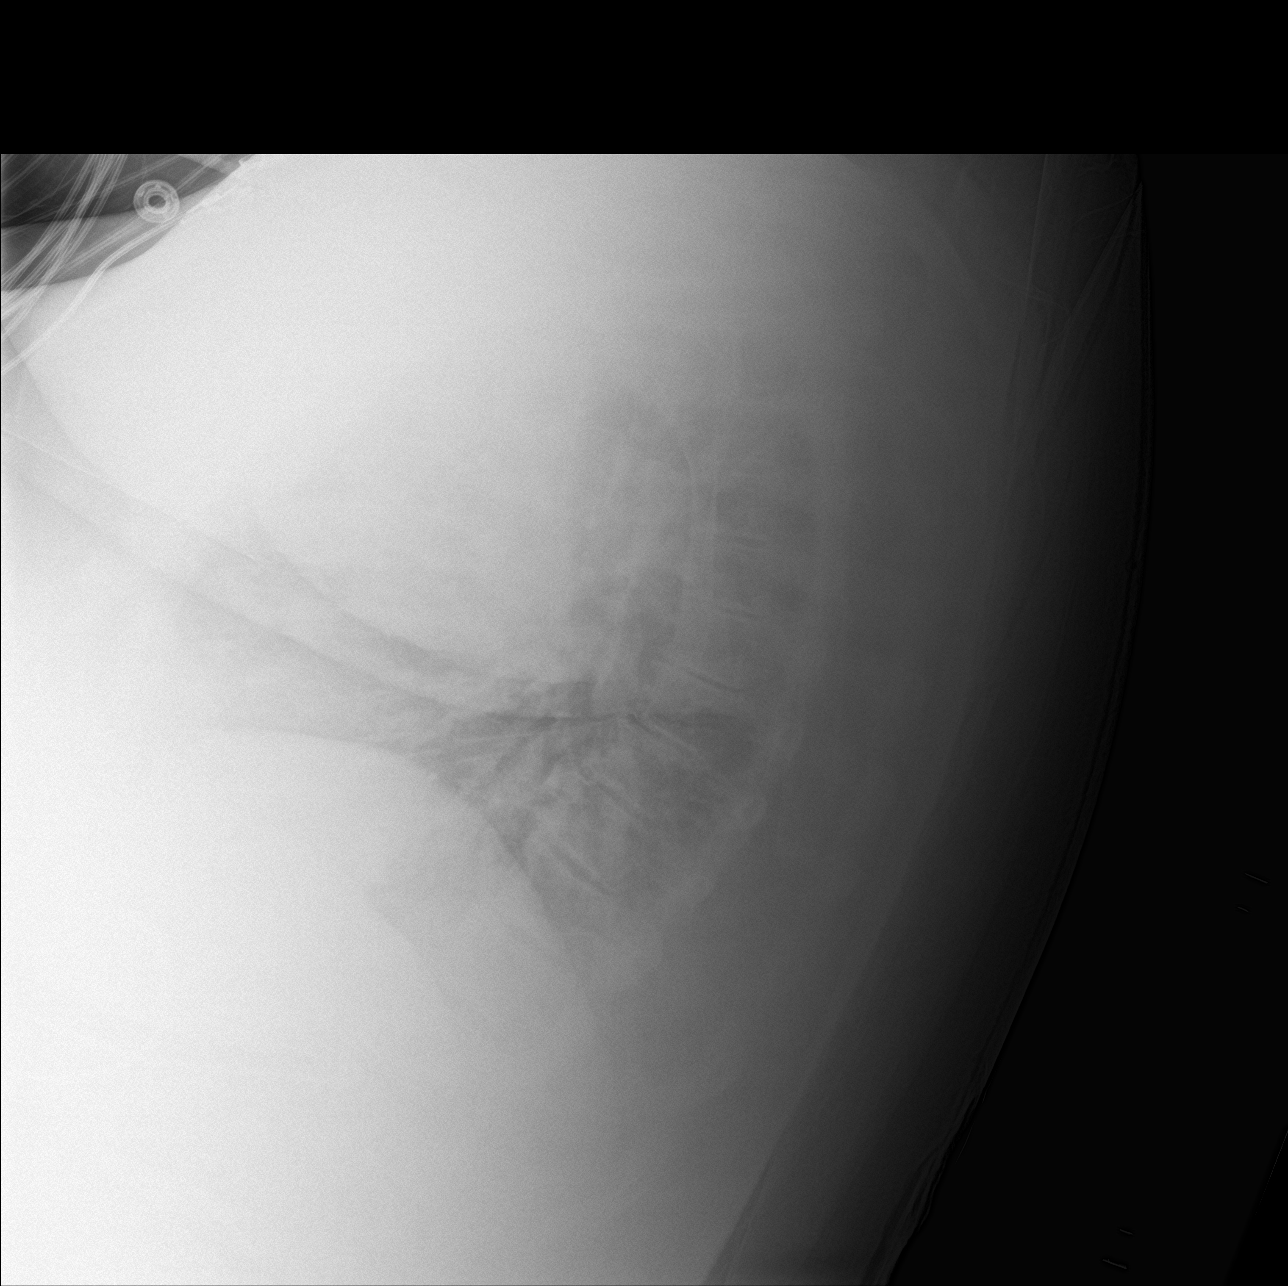

[chest ap]
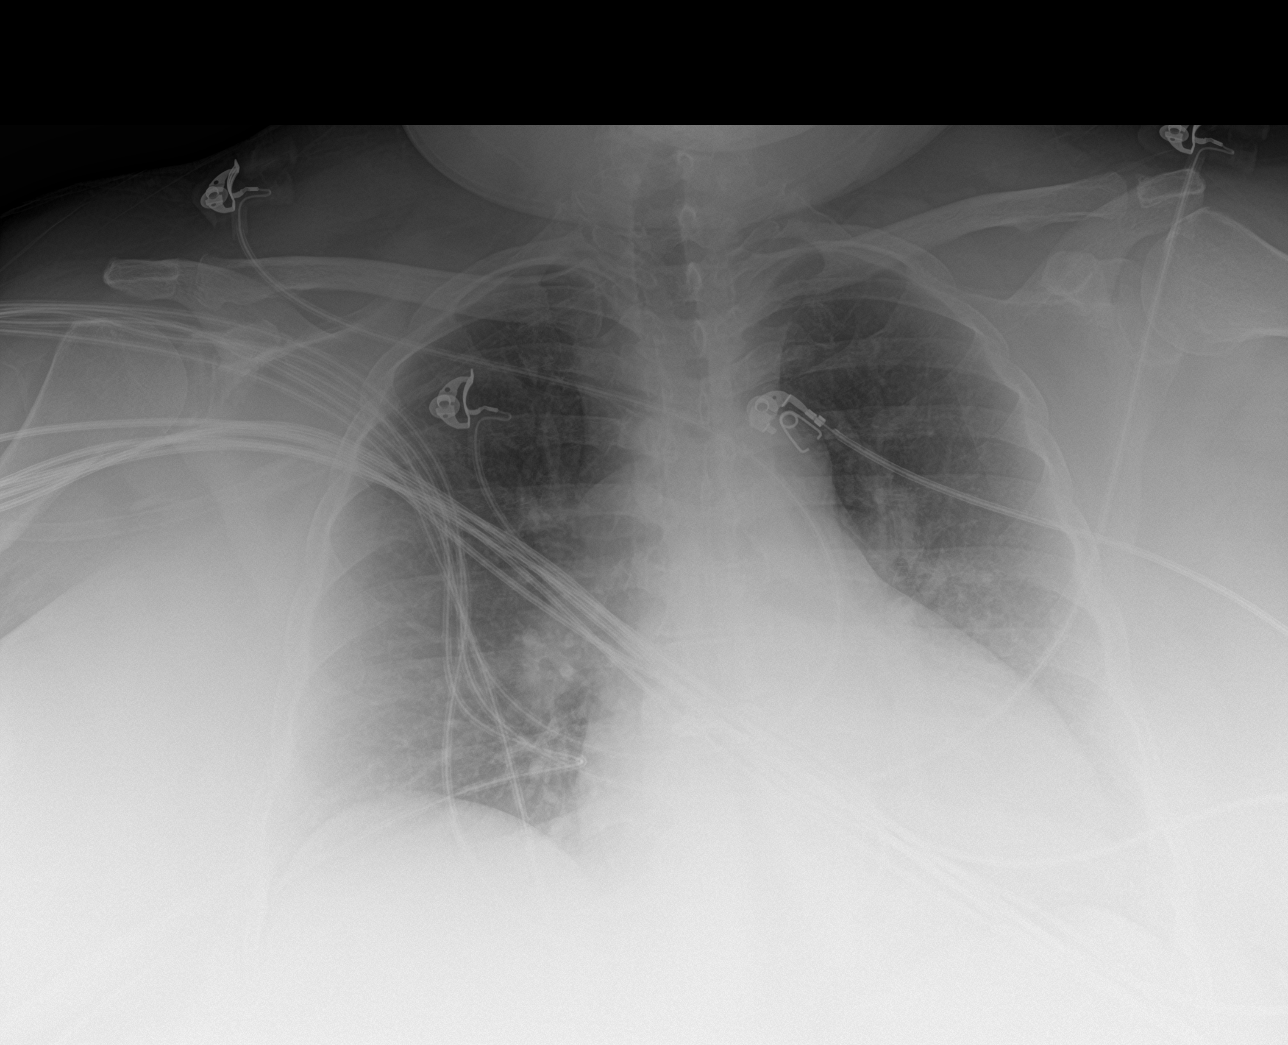

[chest lat (2 of 2)]
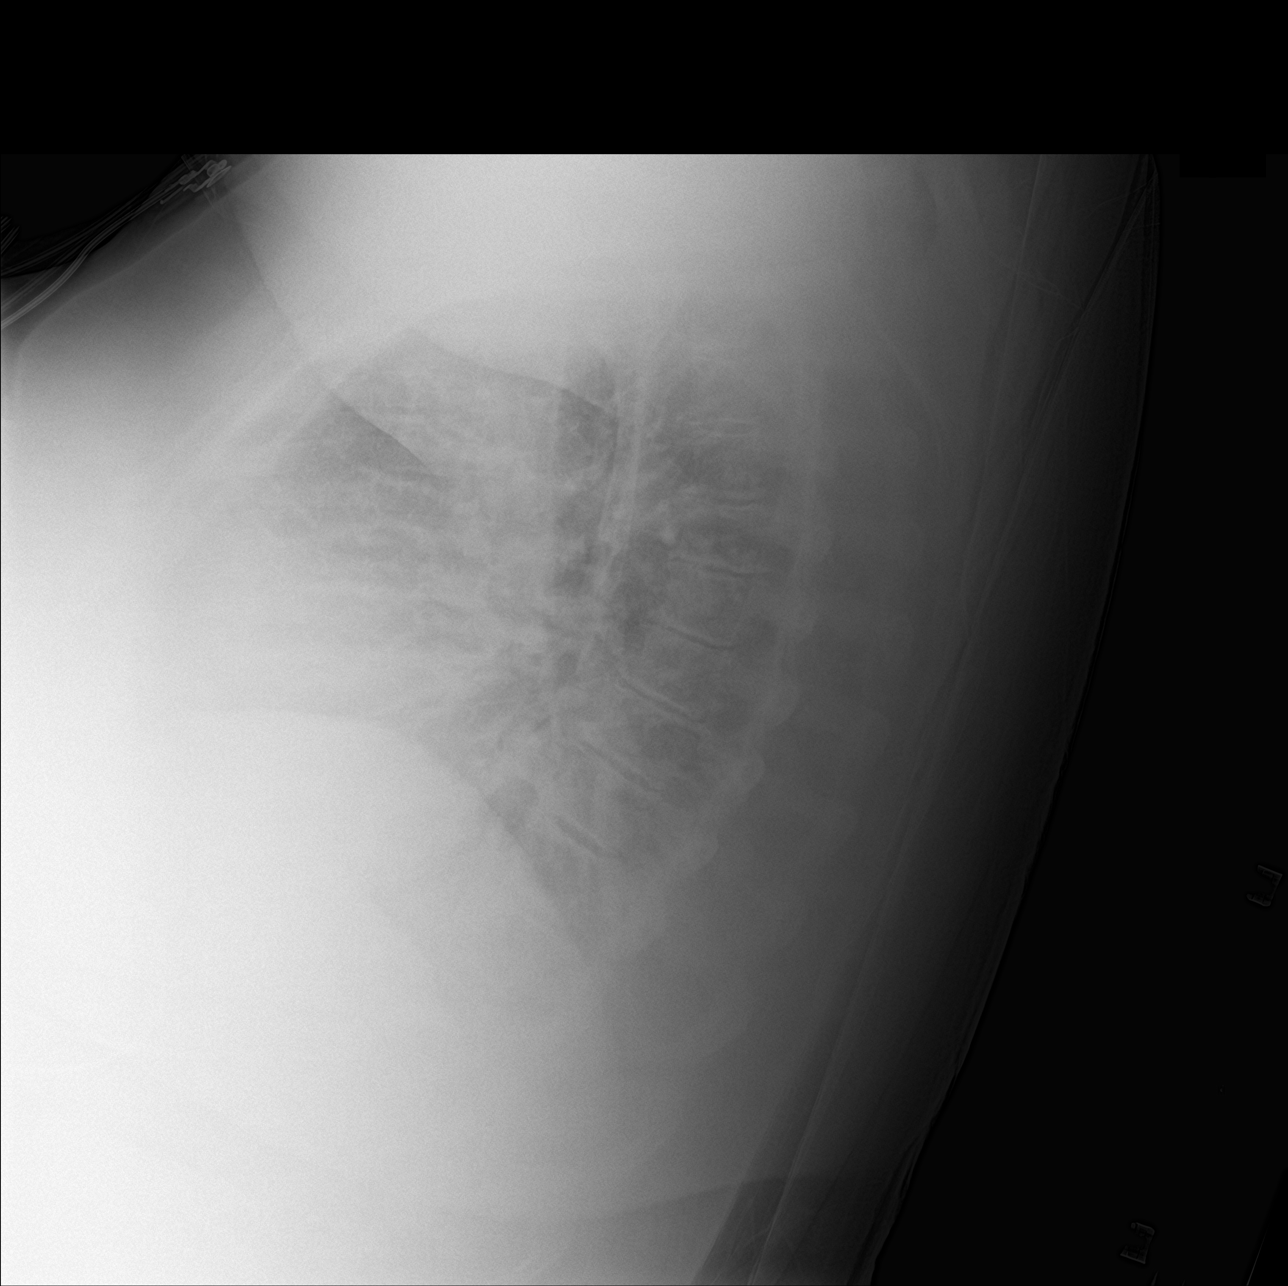

[3 of 3 positions shown; findings below may reference images not displayed]

FINDINGS: Cardiac silhouette is mildly enlarged. No mediastinal or hilar
masses. No evidence of adenopathy.

Clear lungs.  No convincing pleural effusion.  No pneumothorax.

Skeletal structures are grossly intact.
IMPRESSION: No acute cardiopulmonary disease.

## 2020-04-26 ENCOUNTER — Telehealth: Payer: Self-pay | Admitting: Family Medicine

## 2020-04-26 NOTE — Telephone Encounter (Signed)
Copied from [place] [phone]. Topic: General - Other >> Apr 26, 2020  8:28 AM Leward Quan A wrote: Reason for CRM: Patient called in to inquire of Dr Margarita Rana if she can please reach out to [facility] since she is waiting to get some home health help. Asking if Dr Margarita Rana can please reach out ASAP because she has no one to help her and she is restricted. Patient is very upset that she have been waiting so long for this help. Please call patient at Ph#  [phone]  I see where patient has been advised to reach out to [facility] directly several times. Please advise if appropriate.

## 2020-04-26 NOTE — Telephone Encounter (Signed)
Patient came by and picked up information

## 2020-04-26 NOTE — Telephone Encounter (Signed)
Call returned to patient at number noted # [phone], message left with call back requested to this CM.  Call also placed to patient's other # [phone] and that number has been disconnected.

## 2020-04-27 ENCOUNTER — Other Ambulatory Visit: Payer: Self-pay

## 2020-04-27 ENCOUNTER — Ambulatory Visit (INDEPENDENT_AMBULATORY_CARE_PROVIDER_SITE_OTHER): Payer: 59 | Admitting: Psychiatry

## 2020-04-27 DIAGNOSIS — F4310 Post-traumatic stress disorder, unspecified: Secondary | ICD-10-CM

## 2020-04-27 DIAGNOSIS — F250 Schizoaffective disorder, bipolar type: Secondary | ICD-10-CM

## 2020-05-03 ENCOUNTER — Telehealth (INDEPENDENT_AMBULATORY_CARE_PROVIDER_SITE_OTHER): Payer: 59 | Admitting: Psychiatry

## 2020-05-03 ENCOUNTER — Other Ambulatory Visit: Payer: Self-pay

## 2020-05-03 ENCOUNTER — Encounter (HOSPITAL_COMMUNITY): Payer: Self-pay | Admitting: Psychiatry

## 2020-05-03 DIAGNOSIS — F3341 Major depressive disorder, recurrent, in partial remission: Secondary | ICD-10-CM
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
## 2020-05-03 NOTE — Telephone Encounter (Signed)
PCS form will be re faxed to [facility].

## 2020-05-03 NOTE — Progress Notes (Addendum)
[place] MD/PA/NP OP Progress Note  05/03/2020 3:18 PM Tiffany [name]  MRN:  [id number] Interview was conducted by phone and I verified that I was speaking with the correct person using two identifiers. I discussed the limitations of evaluation and management by telemedicine and  the availability of in person appointments. Patient expressed understanding and agreed to proceed. Participants in the visit: patient (location - home); physician (location - office).  Duration 20 min  Chief Complaint: Still some paranoia.  HPI: 60 yo divorced AAF with schizoaffective disorder bipolar type (vs paranoid  schizophrenia), chronic PTSD and binge eating disorder.   She reports she has been doing " all right". She does report some ongoing chronic stress, particularly related to an adult son whom he she states does not respect household or her property. She denies suicidal ideations , denies homicidal ideations , and specifically also denies any homicidal ideations towards son.She denies hallucinations and no delusional ideations were noted during session.  She reports current medications are well tolerated and does not currently endorse side effects. Today does not describe active PTSD type symptoms.  She states she would like to continue current medication regimen as she feels it has helped and been well tolerated . [name] currently on Saphris 10 mgr BID SL, Valium 10 mgr TID PRN for anxiety , Lexapro 20 mgr QDAY , Minipress 2 mgr QHS,  Vyvanse 20 mgr QDAY , Topamax 25 mgr BID. She denies medication side effects. She also states that current regimen has helped her manage binge eating better and that she has been able to lose some weight. Labs reviewed- 02/26/20 - Na + 140, K+ 4.3, Creat 1.21 2/10 EKG  NSR QTc  454          Visit Diagnosis:  No diagnosis found.  Past Psychiatric History: Please see intake H&P.  Past Medical History:  Past Medical History:  Diagnosis Date   Agitation 11/22/2017    Anoxic brain injury ([place]) 09/08/2016   C. Arrest due to respiratory failure and COPD exacerbation   Anxiety    Arthritis    "all over" (04/10/2016)   Asthma 10/18/2010   Binge eating disorder    Cardiac arrest ([place]) 09/08/2016   PEA   Carotid artery stenosis    1-39% bilateral by dopplers 11/2016   Chronic diastolic (congestive) heart failure ([place])    Chronic kidney disease    "I see a kidney dr." (04/10/2016)   Chronic pain syndrome 06/18/2012   Chronic post-traumatic stress disorder (PTSD) 05/27/2018   Chronic respiratory failure with hypoxia and hypercapnia ([place]) 06/22/2015   TRILOGY Vent >AVAPA-ES., Vt target 200-400, Max P 30 , PS max 20 , PS min 6-10 , E Max 6, E Min 4, Rate Auto AVAPS Rate 2 (titrate for pt comfort) , bleed O2 at 5l/m continuous flow .    CKD (chronic kidney disease) stage 3, GFR 30-59 ml/min (HCC) 12/15/2016   Closed displaced fracture of fifth metacarpal bone 03/21/2018   Cocaine use disorder, severe, in sustained remission ([place]) 32/04/4008   Complication of anesthesia    decreased bp, decreased heart rate   COPD (chronic obstructive pulmonary disease) ([place]) 07/08/2014   Depression    Diabetic neuropathy ([place]) 04/24/2011   Difficulty with speech 01/24/2018   Disorder of nervous system    Drug abuse ([name]) 11/21/2017   Dyslipidemia 04/24/2011   Elevated troponin 04/28/2012   Emphysema    Encephalopathy 11/21/2017   Essential hypertension 03/22/2016   Fibula fracture 07/10/2016  Frequent falls 10/11/2017   GERD (gastroesophageal reflux disease)    Gout 04/11/2017   Heart attack ([place]) 1980s   History of blood transfusion 1994   "couldn't stop bleeding from my period"   History of drug abuse in remission ([place]) 11/28/2015   Quit in 2017   Hyperlipidemia LDL goal <70    Incontinence    Manic depression ([place])    Morbid obesity ([place]) 10/18/2010   Obstructive sleep apnea 10/18/2010   On home oxygen therapy    "6L; 24/7" (04/10/2016)   OSA on CPAP    "wear mask sometimes"  (04/10/2016)   Paranoid ([place])    "sometimes; I'm on RX for it" (04/10/2016)   Prolonged Q-T interval on ECG    Rectal bleeding 12/31/2015   Right carotid bruit 11/09/2016   Schizoaffective disorder, bipolar type ([place]) 04/05/2018   Seasonal allergies    Seborrheic keratoses 12/31/2013   Seizures ([place])    "don't know what kind; last one was ~ 1 yr ago" (04/10/2016)   Stroke [facility]) 1980s   denies residual on 04/10/2016   Thrush 09/19/2013   Type 2 diabetes mellitus ([place]) 10/18/2010    Past Surgical History:  Procedure Laterality Date   CESAREAN SECTION  1997   HERNIA REPAIR     IR CHOLANGIOGRAM EXISTING TUBE  07/20/2016   IR PERC CHOLECYSTOSTOMY  05/10/2016   IR RADIOLOGIST EVAL & MGMT  06/08/2016   IR RADIOLOGIST EVAL & MGMT  06/29/2016   IR SINUS/FIST TUBE CHK-NON GI  07/12/2016   RIGHT/LEFT HEART CATH AND CORONARY ANGIOGRAPHY N/A 06/19/2017   Procedure: RIGHT/LEFT HEART CATH AND CORONARY ANGIOGRAPHY;  Surgeon: Jolaine Artist, MD;  Location: [facility];  Service: Cardiovascular;  Laterality: N/A;   TIBIA IM NAIL INSERTION Right 07/12/2016   Procedure: INTRAMEDULLARY (IM) NAIL RIGHT TIBIA;  Surgeon: Leandrew Koyanagi, MD;  Location: [facility];  Service: Orthopedics;  Laterality: Right;   UMBILICAL HERNIA REPAIR  ~ 1963   "that's why I don't have a belly button"   VAGINAL HYSTERECTOMY      Family Psychiatric History: Reviewed.  Family History:  Family History  Problem Relation Age of Onset   Cancer Father        prostate   Cancer Mother        lung   Depression Mother    Depression Sister    Anxiety disorder Sister    Schizophrenia Sister    Bipolar disorder Sister    Depression Sister    Depression Brother    Heart failure Other        cousin    Social History:  Social History   Socioeconomic History   Marital status: Widowed    Spouse name: Not on file   Number of children: 3   Years of education: Not on file   Highest education level: Not on file  Occupational  History   Occupation: disabled    Comment: factory production  Tobacco Use   Smoking status: Former Smoker    Packs/day: 1.50    Years: 38.00    Pack years: 57.00    Types: Cigarettes    Start date: 03/13/1977    Quit date: 04/10/2016    Years since quitting: 4.0   Smokeless tobacco: Never Used  Vaping Use   Vaping Use: Never used  Substance and Sexual Activity   Alcohol use: No    Alcohol/week: 0.0 standard drinks   Drug use: No    Types: Cocaine  Comment: 04/10/2016 "last used cocaine back in November 2017"   Sexual activity: Not Currently    Birth control/protection: Surgical  Other Topics Concern   Not on file  Social History Narrative   Has 1 son, [name]   Lives with son and his boyfriend   Her house has ramps and handrails should she ever needs them.    Her mother lives down the street from her and is a good support person in addition to her son.   She drives herself, has private transportation.    Cocaine free since 02/24/16, smoke free since 04/10/16   Social Determinants of Health   Financial Resource Strain: Not on file  Food Insecurity: Not on file  Transportation Needs: Not on file  Physical Activity: Not on file  Stress: Not on file  Social Connections: Not on file    Allergies:  Allergies  Allergen Reactions   Hydrocodone Shortness Of Breath   Hydrocodone-Acetaminophen Shortness Of Breath   Hydroxyzine Anaphylaxis and Shortness Of Breath   Latuda [Lurasidone Hcl] Anaphylaxis   Lurasidone Anaphylaxis   Magnesium-Containing Compounds Anaphylaxis   Prednisone Anaphylaxis, Swelling and Other (See Comments)    Tongue swelling   Tramadol Anaphylaxis and Swelling   Codeine Nausea And Vomiting   Other Rash   Sulfa Antibiotics Itching   Tape Rash    Metabolic Disorder Labs: Lab Results  Component Value Date   HGBA1C 4.8 02/20/2020   MPG 91.06 02/20/2020   MPG 125.5 01/07/2017   No results found for: PROLACTIN Lab Results  Component Value Date    CHOL 127 08/05/2018   TRIG 53 08/05/2018   HDL 62 08/05/2018   CHOLHDL 2.0 08/05/2018   VLDL 38 01/07/2017   LDLCALC 54 08/05/2018   LDLCALC 56 01/07/2017   Lab Results  Component Value Date   TSH 0.358 02/18/2020   TSH 1.233 10/27/2019    Therapeutic Level Labs: No results found for: LITHIUM Lab Results  Component Value Date   VALPROATE 20 (L) 09/09/2016   VALPROATE 46 (L) 07/10/2016   No components found for:  CBMZ  Current Medications: Current Outpatient Medications  Medication Sig Dispense Refill   albuterol (PROVENTIL) (2.5 MG/3ML) 0.083% nebulizer solution Take 3 mLs (2.5 mg total) by nebulization every 6 (six) hours as needed for wheezing or shortness of breath. 360 mL 5   albuterol (VENTOLIN HFA) 108 (90 Base) MCG/ACT inhaler Inhale 2 puffs into the lungs every 6 (six) hours as needed for wheezing or shortness of breath. 18 g 5   amLODipine (NORVASC) 10 MG tablet Take 1 tablet (10 mg total) by mouth daily.     Asenapine Maleate (SAPHRIS) 10 MG SUBL Place 1 tablet (10 mg total) under the tongue 2 (two) times daily. 60 tablet 0   atorvastatin (LIPITOR) 20 MG tablet Take 1 tablet (20 mg total) by mouth daily. 90 tablet 1   Blood Pressure Monitoring (BLOOD PRESSURE CUFF) MISC 1 Units by Does not apply route daily. 1 each 0   chlorzoxazone (PARAFON FORTE) 250 MG tablet Take 1 tablet (250 mg total) by mouth 4 (four) times daily as needed for muscle spasms. 60 tablet 1   colchicine 0.6 MG tablet Take 1 tablet (0.6 mg total) by mouth 2 (two) times daily. 180 tablet 0   cyclobenzaprine (FLEXERIL) 10 MG tablet Take 1 tablet (10 mg total) by mouth 2 (two) times daily as needed for muscle spasms. 60 tablet 3   diazepam (VALIUM) 10 MG tablet Take 1 tablet (  10 mg total) by mouth 3 (three) times daily as needed for anxiety. 90 tablet 2   diclofenac Sodium (VOLTAREN) 1 % GEL Apply 4 g topically 4 (four) times daily. 100 g 0   Elastic Bandages & Supports (MEDICAL COMPRESSION STOCKINGS)  MISC 1 Units by Does not apply route daily. 1 each 1   escitalopram (LEXAPRO) 20 MG tablet Take 1 tablet (20 mg total) by mouth daily. 30 tablet 2   febuxostat (ULORIC) 40 MG tablet Take 1 tablet (40 mg total) by mouth daily. 90 tablet 0   feeding supplement (ENSURE ENLIVE / ENSURE PLUS) LIQD Take 237 mLs by mouth 2 (two) times daily between meals.     ferrous sulfate 325 (65 FE) MG tablet Take 1 tablet (325 mg total) by mouth daily with breakfast. 180 tablet 0   Fluticasone-Umeclidin-Vilant (TRELEGY ELLIPTA) 100-62.5-25 MCG/INH AEPB Inhale 1 puff into the lungs daily. 60 each 3   folic acid (FOLVITE) 1 MG tablet Take 1 tablet (1 mg total) by mouth daily.     gabapentin (NEURONTIN) 300 MG capsule Take 1 capsule (300 mg total) by mouth at bedtime. 30 capsule 0   hydrALAZINE (APRESOLINE) 50 MG tablet Take 1 tablet (50 mg total) by mouth 2 (two) times daily.     lisdexamfetamine (VYVANSE) 20 MG capsule Take 1 capsule (20 mg total) by mouth daily with breakfast. 30 capsule 0   montelukast (SINGULAIR) 10 MG tablet Take 1 tablet (10 mg total) by mouth at bedtime. 30 tablet 11   Multiple Vitamin (MULTIVITAMIN WITH MINERALS) TABS tablet Take 1 tablet by mouth daily.     omeprazole (PRILOSEC) 40 MG capsule Take 1 capsule (40 mg total) by mouth daily. 90 capsule 1   prazosin (MINIPRESS) 2 MG capsule Take 1 capsule (2 mg total) by mouth at bedtime. 90 capsule 0   thiamine 100 MG tablet Take 1 tablet (100 mg total) by mouth daily.     topiramate (TOPAMAX) 25 MG tablet Take 1 tablet (25 mg total) by mouth 2 (two) times daily. 60 tablet 2   traZODone (DESYREL) 100 MG tablet Take 1 tablet (100 mg total) by mouth at bedtime. (Patient not taking: Reported on 05/03/2020)     Current Facility-Administered Medications  Medication Dose Route Frequency Provider Last Rate Last Admin   betamethasone acetate-betamethasone sodium phosphate (CELESTONE) injection 6 mg  6 mg Other Once Magnus Sinning, MD            Psychiatric Specialty Exam: please note inherent difficulties in obtaining a full MSE in the context of phone communication Review of Systems  Psychiatric/Behavioral: The patient is nervous/anxious.   All other systems reviewed and are negative.   There were no vitals taken for this visit.There is no height or weight on file to calculate BMI.  General Appearance: NA  Eye Contact:  NA  Speech:  Normal/ slow   Volume:  Normal  Mood:  Reports her mood has improved   Affect:  NA  Thought Process:  Goal Directed  Orientation:  Full (Time, Place, and Person)  Thought Content: denies hallucinations, no overt delusions expressed, denies suicidal or self injurious ideations, denies homicidal or violent ideations, specifically also denies HI towards son.   Suicidal Thoughts:  No  Homicidal Thoughts:  No  Memory:  Immediate;   Fair Recent;   Good Remote;   Good  Judgement:  Fair  Insight:  Fair  Psychomotor Activity:  NA  Concentration:  Grossly intact   Recall:  Grossly intact   Fund of Knowledge: Good  Language: Good  Akathisia:  Negative  Handed:  Right  AIMS (if indicated): not done  Assets:  Desire for Improvement Financial Resources/Insurance Housing Resilience  ADL's:  Intact  Cognition: WNL  Sleep:  Fair   Screenings: AUDIT    Flowsheet Row Admission (Discharged) from 12/01/2015 in [facility]  Alcohol Use Disorder Identification Test Final Score (AUDIT) 0      GAD-7    Flowsheet Row Office Visit from 03/21/2018 in [facility] for [facility]  Total GAD-7 Score 4      PHQ2-9    [place] Visit from confidential encounter on 12/11/2018 Office Visit from confidential encounter on 12/10/2018 Office Visit from confidential encounter on 11/18/2018 Office Visit from confidential encounter on 11/08/2018 Telemedicine from confidential encounter on 10/25/2018  PHQ-2 Total Score 0 4 2 1 4   PHQ-9 Total Score -- 10 8 1  16       Flowsheet Row ED to Hosp-Admission (Discharged) from 02/18/2020 in [facility] ED from 02/03/2019 in [facility]PT ED from 10/06/2018 in [facility]PT  C-SSRS RISK CATEGORY Error: Q6 is Yes, you must answer 7 Error: Q6 is Yes, you must answer 7 No Risk        Assessment and Plan: 60 yo divorced AAF with schizoaffective disorder bipolar type ,PTSD ,binge eating disorder. Recent medical admission in early February for AKI , UTI, Encephalopathy  Currently reports feeling better, and denies severe depression or suicidal ideations. Ruminates about an adult son not respecting her boundaries but no overt delusions are expressed at this time. She reports her current medication regimen has been helpful, well tolerated and states she is feeling " a lot better " than before,  currently does not endorse medication side effects. She reports she is not taking Trazodone . She denies any BZD or Stimulant misuse or abuse . Side effects reviewed in including potential risk of movement/metaboic  disorders on Asenapine , orhtostasis/hypotension on Minipress, and palpitations/increased BP on Vyvanse .   Dx: Schizoaffective disorder bipolar type   Plan: Continue current medications  As reviewed with patient , currently on: Asenapine 10 mg TID- have instructed her to decrease to BID.  Minipress 2 mgr QHS  Vyvanse 20 mgr QDAY  Lexapro 20 mgr QDAY  Topamax 25 mgr BID Valium 10 mgr TID PRN anxiety  She currently does not need refills- has a Vyvanse refill from Dr Christena Deem from 3/29 which she has apparently not yet picked up.  Next appointment in about one month. Agrees to contact clinic sooner if any concerns or if she needs refills .     Gabriel Earing, MD   Jenne Campus, MD 05/03/2020, 3:18 PM

## 2020-05-04 NOTE — Telephone Encounter (Signed)
Pt calling stating that she spoke with [facility] and that they still have not received the fax for her extended hours. She states that she was advised to contact PCP. Please advise.

## 2020-05-04 NOTE — Telephone Encounter (Signed)
Pt called back again to check up on her home health orders, please advise.

## 2020-05-04 NOTE — Telephone Encounter (Signed)
Patient calling back checking on the status of message below and would like a call back at [phone]

## 2020-05-05 NOTE — Telephone Encounter (Signed)
Paperwork has been filed out and will be faxed once PCP sign and updates reason for additional hours.

## 2020-05-06 ENCOUNTER — Other Ambulatory Visit: Payer: Self-pay | Admitting: Family Medicine

## 2020-05-06 DIAGNOSIS — M109 Gout, unspecified: Secondary | ICD-10-CM

## 2020-05-06 NOTE — Telephone Encounter (Signed)
I was out of the office last week.  I will sign the paperwork and it will be faxed to her home care agency.

## 2020-05-06 NOTE — Telephone Encounter (Signed)
Pt called again saying this was suppose to be sent out last week.  She said she has been Public librarian and our office everyday and being told it was going to be done.  She said she needs medical assistance asap.

## 2020-05-11 ENCOUNTER — Emergency Department (HOSPITAL_COMMUNITY): Payer: 59

## 2020-05-11 ENCOUNTER — Other Ambulatory Visit: Payer: Self-pay

## 2020-05-11 ENCOUNTER — Inpatient Hospital Stay (HOSPITAL_COMMUNITY)
Admission: EM | Admit: 2020-05-11 | Discharge: 2020-05-16 | DRG: 683 | Disposition: A | Discharge status: Home Health | Payer: 59 | Attending: Family Medicine | Admitting: Family Medicine

## 2020-05-11 DIAGNOSIS — E872 Acidosis: Secondary | ICD-10-CM | POA: Diagnosis present

## 2020-05-11 DIAGNOSIS — N184 Chronic kidney disease, stage 4 (severe): Secondary | ICD-10-CM | POA: Diagnosis present

## 2020-05-11 DIAGNOSIS — I5032 Chronic diastolic (congestive) heart failure: Secondary | ICD-10-CM | POA: Diagnosis present

## 2020-05-11 DIAGNOSIS — E861 Hypovolemia: Secondary | ICD-10-CM | POA: Diagnosis present

## 2020-05-11 DIAGNOSIS — R109 Unspecified abdominal pain: Secondary | ICD-10-CM | POA: Insufficient documentation

## 2020-05-11 DIAGNOSIS — E860 Dehydration: Secondary | ICD-10-CM

## 2020-05-11 DIAGNOSIS — Z888 Allergy status to other drugs, medicaments and biological substances status: Secondary | ICD-10-CM

## 2020-05-11 DIAGNOSIS — Z20822 Contact with and (suspected) exposure to covid-19: Secondary | ICD-10-CM | POA: Diagnosis present

## 2020-05-11 DIAGNOSIS — J439 Emphysema, unspecified: Secondary | ICD-10-CM | POA: Diagnosis present

## 2020-05-11 DIAGNOSIS — R001 Bradycardia, unspecified: Secondary | ICD-10-CM | POA: Diagnosis present

## 2020-05-11 DIAGNOSIS — G4733 Obstructive sleep apnea (adult) (pediatric): Secondary | ICD-10-CM | POA: Diagnosis present

## 2020-05-11 DIAGNOSIS — Z6823 Body mass index (BMI) 23.0-23.9, adult: Secondary | ICD-10-CM

## 2020-05-11 DIAGNOSIS — R197 Diarrhea, unspecified: Secondary | ICD-10-CM | POA: Insufficient documentation

## 2020-05-11 DIAGNOSIS — F250 Schizoaffective disorder, bipolar type: Secondary | ICD-10-CM | POA: Diagnosis present

## 2020-05-11 DIAGNOSIS — G2401 Drug induced subacute dyskinesia: Secondary | ICD-10-CM | POA: Diagnosis present

## 2020-05-11 DIAGNOSIS — E1140 Type 2 diabetes mellitus with diabetic neuropathy, unspecified: Secondary | ICD-10-CM | POA: Diagnosis present

## 2020-05-11 DIAGNOSIS — E440 Moderate protein-calorie malnutrition: Secondary | ICD-10-CM | POA: Insufficient documentation

## 2020-05-11 DIAGNOSIS — K5730 Diverticulosis of large intestine without perforation or abscess without bleeding: Secondary | ICD-10-CM | POA: Diagnosis present

## 2020-05-11 DIAGNOSIS — M5416 Radiculopathy, lumbar region: Secondary | ICD-10-CM

## 2020-05-11 DIAGNOSIS — R112 Nausea with vomiting, unspecified: Secondary | ICD-10-CM | POA: Diagnosis not present

## 2020-05-11 DIAGNOSIS — Z4659 Encounter for fitting and adjustment of other gastrointestinal appliance and device: Secondary | ICD-10-CM

## 2020-05-11 DIAGNOSIS — G9340 Encephalopathy, unspecified: Secondary | ICD-10-CM | POA: Diagnosis present

## 2020-05-11 DIAGNOSIS — I700 Atherosclerosis of aorta: Secondary | ICD-10-CM | POA: Diagnosis present

## 2020-05-11 DIAGNOSIS — D649 Anemia, unspecified: Secondary | ICD-10-CM | POA: Diagnosis present

## 2020-05-11 DIAGNOSIS — I130 Hypertensive heart and chronic kidney disease with heart failure and stage 1 through stage 4 chronic kidney disease, or unspecified chronic kidney disease: Secondary | ICD-10-CM | POA: Diagnosis present

## 2020-05-11 DIAGNOSIS — R1033 Periumbilical pain: Secondary | ICD-10-CM | POA: Diagnosis not present

## 2020-05-11 DIAGNOSIS — F4312 Post-traumatic stress disorder, chronic: Secondary | ICD-10-CM | POA: Diagnosis present

## 2020-05-11 DIAGNOSIS — Z9119 Patient's noncompliance with other medical treatment and regimen: Secondary | ICD-10-CM

## 2020-05-11 DIAGNOSIS — Z818 Family history of other mental and behavioral disorders: Secondary | ICD-10-CM

## 2020-05-11 DIAGNOSIS — K219 Gastro-esophageal reflux disease without esophagitis: Secondary | ICD-10-CM | POA: Diagnosis present

## 2020-05-11 DIAGNOSIS — E43 Unspecified severe protein-calorie malnutrition: Secondary | ICD-10-CM | POA: Insufficient documentation

## 2020-05-11 DIAGNOSIS — E785 Hyperlipidemia, unspecified: Secondary | ICD-10-CM | POA: Diagnosis present

## 2020-05-11 DIAGNOSIS — N189 Chronic kidney disease, unspecified: Secondary | ICD-10-CM | POA: Diagnosis present

## 2020-05-11 DIAGNOSIS — R634 Abnormal weight loss: Secondary | ICD-10-CM | POA: Diagnosis present

## 2020-05-11 DIAGNOSIS — K224 Dyskinesia of esophagus: Secondary | ICD-10-CM | POA: Diagnosis present

## 2020-05-11 DIAGNOSIS — Z87891 Personal history of nicotine dependence: Secondary | ICD-10-CM

## 2020-05-11 DIAGNOSIS — I252 Old myocardial infarction: Secondary | ICD-10-CM | POA: Diagnosis not present

## 2020-05-11 DIAGNOSIS — E876 Hypokalemia: Secondary | ICD-10-CM | POA: Diagnosis present

## 2020-05-11 DIAGNOSIS — I959 Hypotension, unspecified: Secondary | ICD-10-CM | POA: Diagnosis present

## 2020-05-11 DIAGNOSIS — E1122 Type 2 diabetes mellitus with diabetic chronic kidney disease: Secondary | ICD-10-CM | POA: Diagnosis present

## 2020-05-11 DIAGNOSIS — K222 Esophageal obstruction: Secondary | ICD-10-CM | POA: Diagnosis not present

## 2020-05-11 DIAGNOSIS — R1084 Generalized abdominal pain: Secondary | ICD-10-CM

## 2020-05-11 DIAGNOSIS — K229 Disease of esophagus, unspecified: Secondary | ICD-10-CM | POA: Diagnosis not present

## 2020-05-11 DIAGNOSIS — R1310 Dysphagia, unspecified: Secondary | ICD-10-CM

## 2020-05-11 DIAGNOSIS — R130 Aphagia: Secondary | ICD-10-CM

## 2020-05-11 DIAGNOSIS — Z7951 Long term (current) use of inhaled steroids: Secondary | ICD-10-CM

## 2020-05-11 DIAGNOSIS — G894 Chronic pain syndrome: Secondary | ICD-10-CM | POA: Diagnosis present

## 2020-05-11 DIAGNOSIS — N179 Acute kidney failure, unspecified: Secondary | ICD-10-CM | POA: Diagnosis present

## 2020-05-11 DIAGNOSIS — Z882 Allergy status to sulfonamides status: Secondary | ICD-10-CM

## 2020-05-11 DIAGNOSIS — M109 Gout, unspecified: Secondary | ICD-10-CM | POA: Diagnosis present

## 2020-05-11 DIAGNOSIS — Z8674 Personal history of sudden cardiac arrest: Secondary | ICD-10-CM

## 2020-05-11 DIAGNOSIS — L89159 Pressure ulcer of sacral region, unspecified stage: Secondary | ICD-10-CM | POA: Diagnosis present

## 2020-05-11 DIAGNOSIS — Z8673 Personal history of transient ischemic attack (TIA), and cerebral infarction without residual deficits: Secondary | ICD-10-CM | POA: Diagnosis not present

## 2020-05-11 DIAGNOSIS — Z8249 Family history of ischemic heart disease and other diseases of the circulatory system: Secondary | ICD-10-CM

## 2020-05-11 DIAGNOSIS — F419 Anxiety disorder, unspecified: Secondary | ICD-10-CM | POA: Diagnosis present

## 2020-05-11 DIAGNOSIS — Z79899 Other long term (current) drug therapy: Secondary | ICD-10-CM

## 2020-05-11 DIAGNOSIS — I462 Cardiac arrest due to underlying cardiac condition: Secondary | ICD-10-CM | POA: Diagnosis not present

## 2020-05-11 DIAGNOSIS — Z885 Allergy status to narcotic agent status: Secondary | ICD-10-CM

## 2020-05-11 DIAGNOSIS — K8020 Calculus of gallbladder without cholecystitis without obstruction: Secondary | ICD-10-CM | POA: Diagnosis present

## 2020-05-11 LAB — CBC
HCT: 34.6 % — ABNORMAL LOW (ref 36.0–46.0)
Hemoglobin: 10.7 g/dL — ABNORMAL LOW (ref 12.0–15.0)
MCH: 27.6 pg (ref 26.0–34.0)
MCHC: 30.9 g/dL (ref 30.0–36.0)
MCV: 89.4 fL (ref 80.0–100.0)
Platelets: 371 10*3/uL (ref 150–400)
RBC: 3.87 MIL/uL (ref 3.87–5.11)
RDW: 16.9 % — ABNORMAL HIGH (ref 11.5–15.5)
WBC: 7 10*3/uL (ref 4.0–10.5)
nRBC: 0 % (ref 0.0–0.2)

## 2020-05-11 LAB — COMPREHENSIVE METABOLIC PANEL
ALT: 11 U/L (ref 0–44)
AST: 17 U/L (ref 15–41)
Albumin: 2.9 g/dL — ABNORMAL LOW (ref 3.5–5.0)
Alkaline Phosphatase: 96 U/L (ref 38–126)
Anion gap: 10 (ref 5–15)
BUN: 28 mg/dL — ABNORMAL HIGH (ref 8–23)
CO2: 17 mmol/L — ABNORMAL LOW (ref 22–32)
Calcium: 8.1 mg/dL — ABNORMAL LOW (ref 8.9–10.3)
Chloride: 111 mmol/L (ref 98–111)
Creatinine, Ser: 3.48 mg/dL — ABNORMAL HIGH (ref 0.44–1.00)
GFR, Estimated: 14 mL/min — ABNORMAL LOW (ref >60)
Glucose, Bld: 86 mg/dL (ref 70–99)
Potassium: 2.8 mmol/L — ABNORMAL LOW (ref 3.5–5.1)
Sodium: 138 mmol/L (ref 135–145)
Total Bilirubin: 0.5 mg/dL (ref 0.3–1.2)
Total Protein: 5.3 g/dL — ABNORMAL LOW (ref 6.5–8.1)

## 2020-05-11 LAB — HEMOGLOBIN A1C
Hgb A1c MFr Bld: 5.3 % (ref 4.8–5.6)
Mean Plasma Glucose: 105.41 mg/dL

## 2020-05-11 LAB — LIPASE, BLOOD: Lipase: 25 U/L (ref 11–51)

## 2020-05-11 LAB — PHOSPHORUS: Phosphorus: 3.8 mg/dL (ref 2.5–4.6)

## 2020-05-11 LAB — CBG MONITORING, ED: Glucose-Capillary: 57 mg/dL — ABNORMAL LOW (ref 70–99)

## 2020-05-11 LAB — MAGNESIUM: Magnesium: 1.2 mg/dL — ABNORMAL LOW (ref 1.7–2.4)

## 2020-05-11 MED ORDER — HEPARIN SODIUM (PORCINE) 5000 UNIT/ML IJ SOLN
5000.0000 [IU] | Freq: Three times a day (TID) | INTRAMUSCULAR | Status: DC
  Administered 2020-05-11 – 2020-05-15 (×10): 5000 [IU] via SUBCUTANEOUS
  Filled 2020-05-11 (×12): qty 1

## 2020-05-11 MED ORDER — SODIUM CHLORIDE 0.9 % IV BOLUS
1000.0000 mL | Freq: Once | INTRAVENOUS | Status: AC
  Administered 2020-05-11: 1000 mL via INTRAVENOUS

## 2020-05-11 MED ORDER — POTASSIUM CHLORIDE 10 MEQ/100ML IV SOLN
10.0000 meq | INTRAVENOUS | Status: AC
  Administered 2020-05-11 (×2): 10 meq via INTRAVENOUS
  Filled 2020-05-11 (×2): qty 100

## 2020-05-11 MED ORDER — PANTOPRAZOLE SODIUM 40 MG PO TBEC
40.0000 mg | DELAYED_RELEASE_TABLET | Freq: Every day | ORAL | Status: DC
  Administered 2020-05-13 – 2020-05-16 (×3): 40 mg via ORAL
  Filled 2020-05-11 (×5): qty 1

## 2020-05-11 MED ORDER — ALBUTEROL SULFATE (2.5 MG/3ML) 0.083% IN NEBU: 2.5000 mg | INHALATION_SOLUTION | Freq: Four times a day (QID) | RESPIRATORY_TRACT | Status: DC | PRN

## 2020-05-11 MED ORDER — ESCITALOPRAM OXALATE 10 MG PO TABS
20.0000 mg | ORAL_TABLET | Freq: Every day | ORAL | Status: DC
  Administered 2020-05-13: 20 mg via ORAL
  Filled 2020-05-11 (×3): qty 2

## 2020-05-11 MED ORDER — ONDANSETRON HCL 4 MG/2ML IJ SOLN
4.0000 mg | Freq: Once | INTRAMUSCULAR | Status: AC
  Administered 2020-05-11: 4 mg via INTRAVENOUS
  Filled 2020-05-11: qty 2

## 2020-05-11 MED ORDER — PRAZOSIN HCL 2 MG PO CAPS
2.0000 mg | ORAL_CAPSULE | Freq: Every day | ORAL | Status: DC
  Administered 2020-05-12: 2 mg via ORAL
  Filled 2020-05-11 (×3): qty 1

## 2020-05-11 MED ORDER — ONDANSETRON HCL 4 MG PO TABS: 4.0000 mg | ORAL_TABLET | Freq: Four times a day (QID) | ORAL | Status: DC | PRN

## 2020-05-11 MED ORDER — BETAMETHASONE SOD PHOS & ACET 6 (3-3) MG/ML IJ SUSP: 6.0000 mg | Freq: Once | INTRAMUSCULAR | Status: DC

## 2020-05-11 MED ORDER — TOPIRAMATE 25 MG PO TABS
25.0000 mg | ORAL_TABLET | Freq: Two times a day (BID) | ORAL | Status: DC
  Administered 2020-05-12 – 2020-05-13 (×3): 25 mg via ORAL
  Filled 2020-05-11 (×5): qty 1

## 2020-05-11 MED ORDER — TIZANIDINE HCL 4 MG PO TABS
4.0000 mg | ORAL_TABLET | Freq: Three times a day (TID) | ORAL | Status: DC
  Administered 2020-05-11 – 2020-05-13 (×3): 4 mg via ORAL
  Filled 2020-05-11 (×4): qty 1

## 2020-05-11 MED ORDER — MONTELUKAST SODIUM 10 MG PO TABS
10.0000 mg | ORAL_TABLET | Freq: Every day | ORAL | Status: DC
  Administered 2020-05-11 – 2020-05-13 (×3): 10 mg via ORAL
  Filled 2020-05-11 (×4): qty 1

## 2020-05-11 MED ORDER — ALBUTEROL SULFATE HFA 108 (90 BASE) MCG/ACT IN AERS: 2.0000 | INHALATION_SPRAY | Freq: Four times a day (QID) | RESPIRATORY_TRACT | Status: DC | PRN

## 2020-05-11 MED ORDER — POTASSIUM CHLORIDE CRYS ER 20 MEQ PO TBCR
40.0000 meq | EXTENDED_RELEASE_TABLET | Freq: Once | ORAL | Status: DC
  Filled 2020-05-11: qty 2

## 2020-05-11 MED ORDER — LISDEXAMFETAMINE DIMESYLATE 20 MG PO CAPS
20.0000 mg | ORAL_CAPSULE | Freq: Every day | ORAL | Status: DC
  Filled 2020-05-11: qty 1

## 2020-05-11 MED ORDER — ONDANSETRON HCL 4 MG/2ML IJ SOLN: 4.0000 mg | Freq: Four times a day (QID) | INTRAMUSCULAR | Status: DC | PRN

## 2020-05-11 MED ORDER — ATORVASTATIN CALCIUM 10 MG PO TABS
20.0000 mg | ORAL_TABLET | Freq: Every day | ORAL | Status: DC
  Administered 2020-05-13: 20 mg via ORAL
  Filled 2020-05-11 (×3): qty 2

## 2020-05-11 MED ORDER — FLUTICASONE FUROATE-VILANTEROL 100-25 MCG/INH IN AEPB
1.0000 | INHALATION_SPRAY | Freq: Every day | RESPIRATORY_TRACT | Status: DC
  Filled 2020-05-11 (×2): qty 28

## 2020-05-11 MED ORDER — LACTATED RINGERS IV BOLUS
1000.0000 mL | Freq: Once | INTRAVENOUS | Status: AC
  Administered 2020-05-11: 1000 mL via INTRAVENOUS

## 2020-05-11 MED ORDER — FLUTICASONE-UMECLIDIN-VILANT 100-62.5-25 MCG/INH IN AEPB: 1.0000 | INHALATION_SPRAY | Freq: Every day | RESPIRATORY_TRACT | Status: DC

## 2020-05-11 MED ORDER — SODIUM CHLORIDE 0.9 % IV SOLN: INTRAVENOUS | Status: DC

## 2020-05-11 MED ORDER — GABAPENTIN 300 MG PO CAPS
300.0000 mg | ORAL_CAPSULE | Freq: Two times a day (BID) | ORAL | Status: DC
  Administered 2020-05-11 – 2020-05-13 (×3): 300 mg via ORAL
  Filled 2020-05-11 (×4): qty 1

## 2020-05-11 MED ORDER — DICLOFENAC SODIUM 1 % EX GEL
4.0000 g | Freq: Three times a day (TID) | CUTANEOUS | Status: DC
  Administered 2020-05-11 – 2020-05-16 (×8): 4 g via TOPICAL
  Filled 2020-05-11 (×2): qty 100

## 2020-05-11 MED ORDER — FOLIC ACID 1 MG PO TABS
1.0000 mg | ORAL_TABLET | Freq: Every day | ORAL | Status: DC
  Administered 2020-05-13 – 2020-05-16 (×3): 1 mg via ORAL
  Filled 2020-05-11 (×6): qty 1

## 2020-05-11 MED ORDER — ASENAPINE MALEATE 5 MG SL SUBL
10.0000 mg | SUBLINGUAL_TABLET | Freq: Two times a day (BID) | SUBLINGUAL | Status: DC
Start: 2020-05-11 — End: 2020-05-14
  Filled 2020-05-11 (×6): qty 2

## 2020-05-11 MED ORDER — DIAZEPAM 5 MG PO TABS
10.0000 mg | ORAL_TABLET | Freq: Three times a day (TID) | ORAL | Status: DC | PRN
  Administered 2020-05-13: 10 mg via ORAL
  Filled 2020-05-11: qty 2

## 2020-05-11 MED ORDER — UMECLIDINIUM BROMIDE 62.5 MCG/INH IN AEPB
1.0000 | INHALATION_SPRAY | Freq: Every day | RESPIRATORY_TRACT | Status: DC
  Filled 2020-05-11 (×2): qty 7

## 2020-05-11 MED ORDER — FERROUS SULFATE 325 (65 FE) MG PO TABS
325.0000 mg | ORAL_TABLET | Freq: Every day | ORAL | Status: DC
  Filled 2020-05-11: qty 1

## 2020-05-11 MED ORDER — INSULIN ASPART 100 UNIT/ML ~~LOC~~ SOLN: 0.0000 [IU] | Freq: Three times a day (TID) | SUBCUTANEOUS | Status: DC

## 2020-05-11 MED ORDER — MAGNESIUM SULFATE 2 GM/50ML IV SOLN
2.0000 g | Freq: Once | INTRAVENOUS | Status: AC
  Administered 2020-05-11: 2 g via INTRAVENOUS
  Filled 2020-05-11: qty 50

## 2020-05-11 MED ORDER — OXYBUTYNIN CHLORIDE 5 MG PO TABS
5.0000 mg | ORAL_TABLET | Freq: Two times a day (BID) | ORAL | Status: DC
  Administered 2020-05-12 – 2020-05-13 (×3): 5 mg via ORAL
  Filled 2020-05-11 (×5): qty 1

## 2020-05-11 MED ORDER — HYDROMORPHONE HCL 1 MG/ML IJ SOLN
0.5000 mg | Freq: Once | INTRAMUSCULAR | Status: AC
  Administered 2020-05-11: 0.5 mg via INTRAVENOUS
  Filled 2020-05-11: qty 1

## 2020-05-11 NOTE — ED Notes (Signed)
Attempted to obtain blood. Unable to obtain blood samples via straight stick. Notified phlebotomy.

## 2020-05-11 NOTE — ED Triage Notes (Signed)
Pt BIBA from home with nausea, vomiting,, diarrhea. Denies fevers. C/o abdominal pain. Pt states she was just released 2 months ago from the hospital with an ischemic bowel. Pt found to be hypotensive 107/60 with EMS and rec'd 589mL NS bolus with improvement of BP.

## 2020-05-11 NOTE — ED Provider Notes (Addendum)
[facility]ERGENCY DEPARTMENT Provider Note   CSN: [id number] Arrival date & time: 05/11/20  1140     History Chief Complaint  Patient presents with  . Abdominal Pain    Tiffany [name] is a 60 y.o. female.  Patient c/o nausea, vomiting, diarrhea, and abd pain. States diarrhea is chronic with no acute change. No bloody diarrhea. Notes abd pain is also chronic but increased in past day. Symptoms dull, moderate, cramping, non radiating, constant, without specific exacerbating or alleviating factors. No bloody bms or melena. Emesis c/w recent ingestion food/liquids, not bloody or bilious. No abd distension. No dysuria or gu c/o. No fever or chills. No known ill contacts or bad food ingestion. No travel. Denies recent antibiotic use.   The history is provided by the patient and the EMS personnel.  Abdominal Pain Associated symptoms: diarrhea and vomiting   Associated symptoms: no chest pain, no chills, no dysuria, no fever, no shortness of breath and no sore throat        Past Medical History:  Diagnosis Date  . Agitation 11/22/2017  . Anoxic brain injury ([place]) 09/08/2016   C. Arrest due to respiratory failure and COPD exacerbation  . Anxiety   . Arthritis    "all over" (04/10/2016)  . Asthma 10/18/2010  . Binge eating disorder   . Cardiac arrest ([place]) 09/08/2016   PEA  . Carotid artery stenosis    1-39% bilateral by dopplers 11/2016  . Chronic diastolic (congestive) heart failure ([place])   . Chronic kidney disease    "I see a kidney dr." (04/10/2016)  . Chronic pain syndrome 06/18/2012  . Chronic post-traumatic stress disorder (PTSD) 05/27/2018  . Chronic respiratory failure with hypoxia and hypercapnia (HCC) 06/22/2015   TRILOGY Vent >AVAPA-ES., Vt target 200-400, Max P 30 , PS max 20 , PS min 6-10 , E Max 6, E Min 4, Rate Auto AVAPS Rate 2 (titrate for pt comfort) , bleed O2 at 5l/m continuous flow .   [name] CKD (chronic kidney disease) stage 3, GFR 30-59 ml/min  (HCC) 12/15/2016  . Closed displaced fracture of fifth metacarpal bone 03/21/2018  . Cocaine use disorder, severe, in sustained remission ([place]) 12/17/2015  . Complication of anesthesia    decreased bp, decreased heart rate  . COPD (chronic obstructive pulmonary disease) ([place]) 07/08/2014  . Depression   . Diabetic neuropathy ([place]) 04/24/2011  . Difficulty with speech 01/24/2018  . Disorder of nervous system   . Drug abuse ([place]) 11/21/2017  . Dyslipidemia 04/24/2011  . Elevated troponin 04/28/2012  . Emphysema   . Encephalopathy 11/21/2017  . Essential hypertension 03/22/2016  . Fibula fracture 07/10/2016  . Frequent falls 10/11/2017  . GERD (gastroesophageal reflux disease)   . Gout 04/11/2017  . Heart attack ([place]) 1980s  . History of blood transfusion 1994   "couldn't stop bleeding from my period"  . History of drug abuse in remission ([place]) 11/28/2015   Quit in 2017  . Hyperlipidemia LDL goal <70   . Incontinence   . Manic depression ([place])   . Morbid obesity ([place]) 10/18/2010  . Obstructive sleep apnea 10/18/2010  . On home oxygen therapy    "6L; 24/7" (04/10/2016)  . OSA on CPAP    "wear mask sometimes" (04/10/2016)  . Paranoid ([place])    "sometimes; I'm on RX for it" (04/10/2016)  . Prolonged Q-T interval on ECG   . Rectal bleeding 12/31/2015  . Right carotid bruit 11/09/2016  . Schizoaffective disorder, bipolar type (  [place]) 04/05/2018  . Seasonal allergies   . Seborrheic keratoses 12/31/2013  . Seizures ([place])    "don't know what kind; last one was ~ 1 yr ago" (04/10/2016)  . Stroke [facility]) 1980s   denies residual on 04/10/2016  . Thrush 09/19/2013  . Type 2 diabetes mellitus ([place]) 10/18/2010    Patient Active Problem List   Diagnosis Date Noted  . E. coli UTI 02/20/2020  . Acute metabolic encephalopathy 19/14/7829  . Psychosis ([place]) 02/20/2020  . Schizophrenia, schizo-affective ([place]) 02/20/2020  . Metabolic acidosis 56/21/3086  . Altered mental status   . Hypocalcemia   . AKI (acute kidney  injury) ([place]) 02/18/2020  . Pelvic mass in female 01/02/2020  . Genital herpes 11/25/2019  . Pressure injury of skin 10/28/2019  . ARF (acute renal failure) ([place]) 10/27/2019  . Family discord 02/04/2019  . Aggressive behavior   . PTSD (post-traumatic stress disorder) 05/27/2018  . Schizoaffective disorder, bipolar type ([place]) 04/05/2018  . Closed displaced fracture of fifth metacarpal bone 03/21/2018  . Difficulty with speech 01/24/2018  . Encephalopathy 11/21/2017  . Drug abuse ([place]) 11/21/2017  . Frequent falls 10/11/2017  . Binge eating disorder   . Dependence on continuous supplemental oxygen 05/14/2017  . Gout 04/11/2017  . Hypomagnesemia   . CKD (chronic kidney disease) stage 3, GFR 30-59 ml/min (HCC) 12/15/2016  . Carotid artery stenosis   . Hypokalemia   . Osteoarthritis 10/26/2016  . Anoxic brain injury ([place]) 09/08/2016  . Overactive bladder 06/07/2016  . QT prolongation   . OSA and COPD overlap syndrome ([place])   . Arthritis   . Essential hypertension 03/22/2016  . Cocaine use disorder, severe, in sustained remission ([place]) 12/17/2015  . History of drug abuse in remission ([place]) 11/28/2015  . Hyponatremia 11/25/2015  . Chronic diastolic congestive heart failure ([place])   . Chronic respiratory failure with hypoxia and hypercapnia ([place]) 06/22/2015  . Tobacco use disorder 07/22/2014  . COPD (chronic obstructive pulmonary disease) ([place]) 07/08/2014  . Seizure ([place]) 01/04/2013  . Chronic pain syndrome 06/18/2012  . Dyslipidemia 04/24/2011  . Diabetic neuropathy ([place]) 04/24/2011  . Morbid obesity ([place]) 10/18/2010  . Type 2 diabetes mellitus ([place]) 10/18/2010    Past Surgical History:  Procedure Laterality Date  . CESAREAN SECTION  1997  . HERNIA REPAIR    . IR CHOLANGIOGRAM EXISTING TUBE  07/20/2016  . IR PERC CHOLECYSTOSTOMY  05/10/2016  . IR RADIOLOGIST EVAL & MGMT  06/08/2016  . IR RADIOLOGIST EVAL & MGMT  06/29/2016  . IR SINUS/FIST TUBE CHK-NON GI  07/12/2016  .  RIGHT/LEFT HEART CATH AND CORONARY ANGIOGRAPHY N/A 06/19/2017   Procedure: RIGHT/LEFT HEART CATH AND CORONARY ANGIOGRAPHY;  Surgeon: Jolaine Artist, MD;  Location: [facility];  Service: Cardiovascular;  Laterality: N/A;  . TIBIA IM NAIL INSERTION Right 07/12/2016   Procedure: INTRAMEDULLARY (IM) NAIL RIGHT TIBIA;  Surgeon: Leandrew Koyanagi, MD;  Location: [facility];  Service: Orthopedics;  Laterality: Right;  . UMBILICAL HERNIA REPAIR  ~ 1963   "that's why I don't have a belly button"  . VAGINAL HYSTERECTOMY       OB History    Gravida  4   Para  4   Term  3   Preterm  1   AB  0   Living  3     SAB  0   IAB  0   Ectopic  0   Multiple  0   Live Births  3  Family History  Problem Relation Age of Onset  . Cancer Father        prostate  . Cancer Mother        lung  . Depression Mother   . Depression Sister   . Anxiety disorder Sister   . Schizophrenia Sister   . Bipolar disorder Sister   . Depression Sister   . Depression Brother   . Heart failure Other        cousin    Social History   Tobacco Use  . Smoking status: Former Smoker    Packs/day: 1.50    Years: 38.00    Pack years: 57.00    Types: Cigarettes    Start date: 03/13/1977    Quit date: 04/10/2016    Years since quitting: 4.0  . Smokeless tobacco: Never Used  Vaping Use  . Vaping Use: Never used  Substance Use Topics  . Alcohol use: No    Alcohol/week: 0.0 standard drinks  . Drug use: No    Types: Cocaine    Comment: 04/10/2016 "last used cocaine back in November 2017"    Home Medications Prior to Admission medications   Medication Sig Start Date End Date Taking? Authorizing Provider  albuterol (PROVENTIL) (2.5 MG/3ML) 0.083% nebulizer solution Take 3 mLs (2.5 mg total) by nebulization every 6 (six) hours as needed for wheezing or shortness of breath. 03/17/20   Collene Gobble, MD  albuterol (VENTOLIN HFA) 108 (90 Base) MCG/ACT inhaler Inhale 2 puffs into the lungs every 6  (six) hours as needed for wheezing or shortness of breath. 03/17/20   Collene Gobble, MD  amLODipine (NORVASC) 10 MG tablet Take 1 tablet (10 mg total) by mouth daily. 02/27/20   Hongalgi, Lenis Dickinson, MD  Asenapine Maleate (SAPHRIS) 10 MG SUBL Place 1 tablet (10 mg total) under the tongue 2 (two) times daily. 04/20/20   Arfeen, Arlyce Harman, MD  atorvastatin (LIPITOR) 20 MG tablet Take 1 tablet (20 mg total) by mouth daily. 03/30/20   [name], MD  Blood Pressure Monitoring (BLOOD PRESSURE CUFF) MISC 1 Units by Does not apply route daily. 11/26/19   Mayers, Cari S, PA-C  chlorzoxazone (PARAFON FORTE) 250 MG tablet Take 1 tablet (250 mg total) by mouth 4 (four) times daily as needed for muscle spasms. 03/23/20   Hilts, Legrand Como, MD  colchicine 0.6 MG tablet TAKE 1 TABLET(0.6 MG) BY MOUTH TWICE DAILY 05/06/20   [name], MD  cyclobenzaprine (FLEXERIL) 10 MG tablet Take 1 tablet (10 mg total) by mouth 2 (two) times daily as needed for muscle spasms. 03/10/20   [name], MD  diazepam (VALIUM) 10 MG tablet Take 1 tablet (10 mg total) by mouth 3 (three) times daily as needed for anxiety. 03/05/20 06/03/20  Pucilowski, Marchia Bond, MD  diclofenac Sodium (VOLTAREN) 1 % GEL Apply 4 g topically 4 (four) times daily. 03/30/20   [name], MD  Elastic Bandages & Supports (MEDICAL COMPRESSION STOCKINGS) MISC 1 Units by Does not apply route daily. 11/26/19   Mayers, Cari S, PA-C  escitalopram (LEXAPRO) 20 MG tablet Take 1 tablet (20 mg total) by mouth daily. 03/05/20 03/05/21  Pucilowski, Marchia Bond, MD  febuxostat (ULORIC) 40 MG tablet Take 1 tablet (40 mg total) by mouth daily. 03/30/20   [name], MD  feeding supplement (ENSURE ENLIVE / ENSURE PLUS) LIQD Take 237 mLs by mouth 2 (two) times daily between meals. 02/26/20   Hongalgi, Lenis Dickinson, MD  ferrous sulfate 325 (65  FE) MG tablet Take 1 tablet (325 mg total) by mouth daily with breakfast. 11/02/19   Florencia Reasons, MD  Fluticasone-Umeclidin-Vilant (TRELEGY  ELLIPTA) 100-62.5-25 MCG/INH AEPB Inhale 1 puff into the lungs daily. 03/17/20   Collene Gobble, MD  folic acid (FOLVITE) 1 MG tablet Take 1 tablet (1 mg total) by mouth daily. 02/27/20   Hongalgi, Lenis Dickinson, MD  gabapentin (NEURONTIN) 300 MG capsule Take 1 capsule (300 mg total) by mouth at bedtime. 04/20/20 04/20/21  Arfeen, Arlyce Harman, MD  hydrALAZINE (APRESOLINE) 50 MG tablet Take 1 tablet (50 mg total) by mouth 2 (two) times daily. 02/26/20   Hongalgi, Lenis Dickinson, MD  lisdexamfetamine (VYVANSE) 20 MG capsule Take 1 capsule (20 mg total) by mouth daily with breakfast. 04/13/20 05/13/20  Pucilowski, Marchia Bond, MD  montelukast (SINGULAIR) 10 MG tablet Take 1 tablet (10 mg total) by mouth at bedtime. 11/28/19   Collene Gobble, MD  Multiple Vitamin (MULTIVITAMIN WITH MINERALS) TABS tablet Take 1 tablet by mouth daily. 02/27/20   Hongalgi, Lenis Dickinson, MD  omeprazole (PRILOSEC) 40 MG capsule Take 1 capsule (40 mg total) by mouth daily. 12/29/19   [name], MD  prazosin (MINIPRESS) 2 MG capsule Take 1 capsule (2 mg total) by mouth at bedtime. 03/05/20 06/03/20  Pucilowski, Marchia Bond, MD  thiamine 100 MG tablet Take 1 tablet (100 mg total) by mouth daily. 02/27/20   Hongalgi, Lenis Dickinson, MD  topiramate (TOPAMAX) 25 MG tablet Take 1 tablet (25 mg total) by mouth 2 (two) times daily. 04/13/20 07/12/20  Pucilowski, Marchia Bond, MD  traZODone (DESYREL) 100 MG tablet Take 1 tablet (100 mg total) by mouth at bedtime. Patient not taking: Reported on 05/03/2020 02/26/20   Modena Jansky, MD  budesonide-formoterol [facility]) 80-4.5 MCG/ACT inhaler Inhale 2 puffs into the lungs 2 (two) times daily. Patient not taking: Reported on 09/29/2019 11/15/18 09/29/19  Maximiano Coss, NP    Allergies    Hydrocodone, Hydrocodone-acetaminophen, Hydroxyzine, Latuda [lurasidone hcl], Lurasidone, Magnesium-containing compounds, Prednisone, Tramadol, Codeine, Other, Sulfa antibiotics, and Tape  Review of Systems   Review of Systems   Constitutional: Negative for chills and fever.  HENT: Negative for sore throat.   Eyes: Negative for redness.  Respiratory: Negative for shortness of breath.   Cardiovascular: Negative for chest pain.  Gastrointestinal: Positive for abdominal pain, diarrhea and vomiting.  Genitourinary: Negative for dysuria and flank pain.  Musculoskeletal: Negative for back pain and neck pain.  Skin: Negative for rash.  Neurological: Negative for headaches.  Hematological: Does not bruise/bleed easily.  Psychiatric/Behavioral: Negative for confusion.    Physical Exam Updated Vital Signs BP 110/87 (BP Location: Right Arm)   Pulse 75   Temp 98.4 F (36.9 C) (Oral)   Resp 17   SpO2 98%   Physical Exam Vitals and nursing note reviewed.  Constitutional:      Appearance: Normal appearance. [name] well-developed.  HENT:     Head: Atraumatic.     Nose: Nose normal.     Mouth/Throat:     Mouth: Mucous membranes are moist.  Eyes:     General: No scleral icterus.    Conjunctiva/sclera: Conjunctivae normal.  Neck:     Trachea: No tracheal deviation.  Cardiovascular:     Rate and Rhythm: Normal rate and regular rhythm.     Pulses: Normal pulses.     Heart sounds: Normal heart sounds. No murmur heard. No friction rub. No gallop.   Pulmonary:     Effort: Pulmonary  effort is normal. No respiratory distress.     Breath sounds: Normal breath sounds.  Abdominal:     General: Bowel sounds are normal. There is no distension.     Palpations: Abdomen is soft.     Tenderness: There is abdominal tenderness. There is no guarding.     Comments: Mid/diffuse abd tenderness. No rebound or guarding.   Genitourinary:    Comments: No cva tenderness.  Musculoskeletal:        General: No swelling.     Cervical back: Normal range of motion and neck supple. No rigidity. No muscular tenderness.  Skin:    General: Skin is warm and dry.     Findings: No rash.  Neurological:     Mental Status: [name] alert.      Comments: Alert, speech normal.   Psychiatric:        Mood and Affect: Mood normal.     ED Results / Procedures / Treatments   Labs (all labs ordered are listed, but only abnormal results are displayed) Results for orders placed or performed during the hospital encounter of 05/11/20  CBC  Result Value Ref Range   WBC 7.0 4.0 - 10.5 K/uL   RBC 3.87 3.87 - 5.11 MIL/uL   Hemoglobin 10.7 (L) 12.0 - 15.0 g/dL   HCT 34.6 (L) 36.0 - 46.0 %   MCV 89.4 80.0 - 100.0 fL   MCH 27.6 26.0 - 34.0 pg   MCHC 30.9 30.0 - 36.0 g/dL   RDW 16.9 (H) 11.5 - 15.5 %   Platelets 371 150 - 400 K/uL   nRBC 0.0 0.0 - 0.2 %  Comprehensive metabolic panel  Result Value Ref Range   Sodium 138 135 - 145 mmol/L   Potassium 2.8 (L) 3.5 - 5.1 mmol/L   Chloride 111 98 - 111 mmol/L   CO2 17 (L) 22 - 32 mmol/L   Glucose, Bld 86 70 - 99 mg/dL   BUN 28 (H) 8 - 23 mg/dL   Creatinine, Ser 3.48 (H) 0.44 - 1.00 mg/dL   Calcium 8.1 (L) 8.9 - 10.3 mg/dL   Total Protein 5.3 (L) 6.5 - 8.1 g/dL   Albumin 2.9 (L) 3.5 - 5.0 g/dL   AST 17 15 - 41 U/L   ALT 11 0 - 44 U/L   Alkaline Phosphatase 96 38 - 126 U/L   Total Bilirubin 0.5 0.3 - 1.2 mg/dL   GFR, Estimated 14 (L) >60 mL/min   Anion gap 10 5 - 15  Lipase, blood  Result Value Ref Range   Lipase 25 11 - 51 U/L  Magnesium  Result Value Ref Range   Magnesium 1.2 (L) 1.7 - 2.4 mg/dL   *Note: Due to a large number of results and/or encounters for the requested time period, some results have not been displayed. A complete set of results can be found in Results Review.    EKG EKG Interpretation  Date/Time:  Tuesday May 11 2020 11:52:37 EDT Ventricular Rate:  76 PR Interval:  145 QRS Duration: 84 QT Interval:  429 QTC Calculation: 483 R Axis:   23 Text Interpretation: Sinus rhythm Nonspecific T wave abnormality Confirmed by Lajean Saver [phone]) on 05/11/2020 12:01:19 PM   Radiology CT Abdomen Pelvis Wo Contrast  Result Date: 05/11/2020 CLINICAL DATA:   60 year old female with abdominal pain. EXAM: CT ABDOMEN AND PELVIS WITHOUT CONTRAST TECHNIQUE: Multidetector CT imaging of the abdomen and pelvis was performed following the standard protocol without IV contrast. COMPARISON:  CT abdomen pelvis dated 10/30/2019 abdominal radiograph dated 02/21/2020. FINDINGS: Evaluation of this exam is limited in the absence of intravenous contrast. Lower chest: The visualized lung bases are clear. No intra-abdominal free air or free fluid. Hepatobiliary: The liver is unremarkable. No intrahepatic biliary dilatation. Multiple stones noted in the gallbladder. No pericholecystic fluid or evidence of acute cholecystitis by CT. Pancreas: The pancreas is suboptimally visualized but grossly unremarkable. No active inflammatory changes. Spleen: Normal in size without focal abnormality. Adrenals/Urinary Tract: The adrenal glands unremarkable. The kidneys, and the visualized ureters appear unremarkable. There is apparent focal area thickening of the left posterior bladder wall measuring approximately 5 mm in thickness (66/3 and 57/6). This area is suboptimally evaluated by CT. A urothelial lesion is not excluded. Further evaluation with cystoscopy is recommended. Stomach/Bowel: There is sigmoid diverticulosis and scattered colonic diverticula without active inflammatory changes. There is no bowel obstruction or active inflammation. The appendix is normal. Vascular/Lymphatic: Mild aortoiliac atherosclerotic disease. The IVC is unremarkable. No portal venous gas. There is no adenopathy. Reproductive: Hysterectomy. There is an 8 cm cystic lesion arising from the left adnexa. Because this lesion is not adequately characterized, prompt [name] is recommended for further evaluation. Note: This recommendation does not apply to premenarchal patients and to those with increased risk (genetic, family history, elevated tumor markers or other high-risk factors) of ovarian cancer. Reference: JACR 2020 Feb;  17(2):248-254 Other: Mild diffuse subcutaneous stranding. No fluid collection. Musculoskeletal: Osteopenia with degenerative changes of the spine. Grade 1 L3-L4 anterolisthesis. Multilevel facet arthropathy. No acute osseous pathology. IMPRESSION: 1. No acute intra-abdominal or pelvic pathology. 2. Colonic diverticulosis. No bowel obstruction. Normal appendix. 3. Cholelithiasis. 4. A 8 cm left adnexal cystic lesion. Further evaluation with ultrasound is recommended. 5. Aortic Atherosclerosis (ICD10-I70.0). Electronically Signed   By: Anner Crete M.D.   On: 05/11/2020 15:58    Procedures Procedures   Medications Ordered in ED Medications  sodium chloride 0.9 % bolus 1,000 mL (1,000 mLs Intravenous New Bag/Given 05/11/20 1203)  ondansetron (ZOFRAN) injection 4 mg (4 mg Intravenous Given 05/11/20 1202)  HYDROmorphone (DILAUDID) injection 0.5 mg (0.5 mg Intravenous Given 05/11/20 1203)    ED Course  I have reviewed the triage vital signs and the nursing notes.  Pertinent labs & imaging results that were available during my care of the patient were reviewed by me and considered in my medical decision making (see chart for details).    MDM Rules/Calculators/A&P                          Iv ns bolus. zofran iv. Dilaudid iv. Labs sent.   Reviewed nursing notes and prior charts for additional history.   Labs reviewed/interpreted by me - chem normal.   Pain/symptoms persists, will get ct.   Additional labs reviewed/interpreted by me - k low. Ecg. Continuous cardiac monitoring. Mg added to labs. Kcl iv. kcl po.   Mg also low. Mg iv.   Patient with AKI on labs, hx same - seems similar to prior admission. Iv ns boluses.   CRITICAL CARE RE: NVD with  AKI, severe hypokalemia and hypomagnesemia requiring iv replacement.  Performed by: Mirna Mires Total critical care time: 45 minutes Critical care time was exclusive of separately billable procedures and treating other patients. Critical  care was necessary to treat or prevent imminent or life-threatening deterioration. Critical care was time spent personally by me on the following activities: development of treatment plan with patient  and/or surrogate as well as nursing, discussions with consultants, evaluation of patient's response to treatment, examination of patient, obtaining history from patient or surrogate, ordering and performing treatments and interventions, ordering and review of laboratory studies, ordering and review of radiographic studies, pulse oximetry and re-evaluation of patient's condition.  Medicine service consulted for admission.   CT reviewed/interpreted by me - no acute process. Persistent gyn/pelvic cystic lesion and gallstones again noted (pt will need f/u for same).     Final Clinical Impression(s) / ED Diagnoses Final diagnoses:  None    Rx / DC Orders ED Discharge Orders    None          Lajean Saver, MD 05/11/20 [phone]

## 2020-05-11 NOTE — ED Notes (Signed)
Report called to [place], RN receiving pt in room 2C8.
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
## 2020-05-12 ENCOUNTER — Inpatient Hospital Stay (HOSPITAL_COMMUNITY): Payer: 59

## 2020-05-12 ENCOUNTER — Telehealth (HOSPITAL_COMMUNITY): Payer: Self-pay

## 2020-05-12 DIAGNOSIS — R1310 Dysphagia, unspecified: Secondary | ICD-10-CM

## 2020-05-12 DIAGNOSIS — R1033 Periumbilical pain: Secondary | ICD-10-CM

## 2020-05-12 DIAGNOSIS — F250 Schizoaffective disorder, bipolar type: Secondary | ICD-10-CM | POA: Diagnosis not present

## 2020-05-12 DIAGNOSIS — R197 Diarrhea, unspecified: Secondary | ICD-10-CM

## 2020-05-12 DIAGNOSIS — R112 Nausea with vomiting, unspecified: Secondary | ICD-10-CM | POA: Diagnosis not present

## 2020-05-12 LAB — COMPREHENSIVE METABOLIC PANEL
ALT: 9 U/L (ref 0–44)
AST: 16 U/L (ref 15–41)
Albumin: 2.4 g/dL — ABNORMAL LOW (ref 3.5–5.0)
Alkaline Phosphatase: 80 U/L (ref 38–126)
Anion gap: 6 (ref 5–15)
BUN: 23 mg/dL (ref 8–23)
CO2: 17 mmol/L — ABNORMAL LOW (ref 22–32)
Calcium: 7.7 mg/dL — ABNORMAL LOW (ref 8.9–10.3)
Chloride: 116 mmol/L — ABNORMAL HIGH (ref 98–111)
Creatinine, Ser: 2.48 mg/dL — ABNORMAL HIGH (ref 0.44–1.00)
GFR, Estimated: 22 mL/min — ABNORMAL LOW (ref >60)
Glucose, Bld: 77 mg/dL (ref 70–99)
Potassium: 3 mmol/L — ABNORMAL LOW (ref 3.5–5.1)
Sodium: 139 mmol/L (ref 135–145)
Total Bilirubin: 0.6 mg/dL (ref 0.3–1.2)
Total Protein: 4.5 g/dL — ABNORMAL LOW (ref 6.5–8.1)

## 2020-05-12 LAB — GLUCOSE, CAPILLARY
Glucose-Capillary: 82 mg/dL (ref 70–99)
Glucose-Capillary: 91 mg/dL (ref 70–99)
Glucose-Capillary: 70 mg/dL (ref 70–99)
Glucose-Capillary: 82 mg/dL (ref 70–99)

## 2020-05-12 LAB — MRSA PCR SCREENING: MRSA by PCR: NEGATIVE

## 2020-05-12 LAB — MAGNESIUM: Magnesium: 1.5 mg/dL — ABNORMAL LOW (ref 1.7–2.4)

## 2020-05-12 MED ORDER — SILVER SULFADIAZINE 1 % EX CREA
TOPICAL_CREAM | Freq: Every day | CUTANEOUS | Status: DC
  Administered 2020-05-15: 1 via TOPICAL
  Filled 2020-05-12 (×2): qty 85

## 2020-05-12 MED ORDER — POTASSIUM CHLORIDE 2 MEQ/ML IV SOLN
INTRAVENOUS | Status: DC
  Filled 2020-05-12 (×4): qty 1000

## 2020-05-12 MED ORDER — MAGNESIUM SULFATE 2 GM/50ML IV SOLN
2.0000 g | Freq: Once | INTRAVENOUS | Status: AC
  Administered 2020-05-12: 2 g via INTRAVENOUS
  Filled 2020-05-12: qty 50
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
## 2020-05-12 NOTE — Telephone Encounter (Signed)
Patient followup appointment scheduled with [name] on 06/28/20

## 2020-05-12 NOTE — Progress Notes (Signed)
PROGRESS NOTE   KATRIANNA FRIESENHAHN  BSJ:[id number] DOB: 1960-08-11 DOA: 05/11/2020 PCP: [name], MD  Brief Narrative:  67 community dwelling black female Prior admission 2/2-->02/26/2020 Known schizoaffective disorder with psychosis involuntarily committed at that admission and was noted to not be taking her medications as she should Came in with AKI last admission E. coli cystitis and metabolic encephalopathy along with cystic structure superior to the bladder for outpatient follow-up  Developed watery diarrhea 3 weeks prior to admission 3-4 times a day + nausea + vomit-also dysphagia, inability to take medication X 3 days  Hospital-Problem based course AKI on admission likely secondary to diarrhea, N, V Creatinine improving well-LR 100 cc/H+ K 20 mEq per bag Repeat labs a.m. Dysphagia?,  Weight loss  declined barium swallow, EGD and other testing Patient refusing endoscopy citing that she does not "want to go under anesthesia" Patient has been deemed to have capacity by psychiatry It seems patient is willing to undergo this procedure tomorrow?-Defer to GI Hypomagnesemia, hypokalemia Add on magnesium to labs Replace as indicated if below 2 DM TY 2 CBG 70-91 eating 40% of meals Continue sliding scale coverage HTN Amlodipine 10 on hold resume if needed in the next several days Schizophrenia-delusions Appreciate psychiatry input Continue Saphris 10, Valium 10 3 times daily, Lexapro 20, gabapentin 600 twice daily, Vynase 20 a.m., Topamax 25 twice daily   DVT prophylaxis:  Continue heparin 3 times daily Code Status: Full Family Communication: None at bedside and she wants to update them herself Disposition:  Status is: Inpatient  Remains inpatient appropriate because:Hemodynamically unstable, Ongoing diagnostic testing needed not appropriate for outpatient work up and Unsafe d/c plan   Dispo: The patient is from: Home              Anticipated d/c is to: Home               Patient currently is not medically stable to d/c.   Difficult to place patient No    Consultants:   Gastroenterology  GI  Procedures: None yet  Antimicrobials: None   Subjective: Patient worried about her left foot says she has a boil there She also tells me she does note want to undergo a procedure if she has to be knocked out Otherwise she is not having any difficulty swallowing Jell-O and eating clear liquids she is unwilling to graduate her diet   Objective: Vitals:   05/11/20 2259 05/12/20 0300 05/12/20 0729 05/12/20 1155  BP: 114/78 90/61 91/62  98/65  Pulse: 60 61 62 (!) 54  Resp: 16 15 14 12   Temp: 98 F (36.7 C) 97.8 F (36.6 C) 98.4 F (36.9 C) 98.7 F (37.1 C)  TempSrc: Oral Oral Oral Oral  SpO2: 98% 97% 97% 96%  Weight: 54.6 kg     Height: 5\' 3"  (1.6 m)       Intake/Output Summary (Last 24 hours) at 05/12/2020 1333 Last data filed at 05/11/2020 2300 Gross per 24 hour  Intake 2952.5 ml  Output --  Net 2952.5 ml   Filed Weights   05/11/20 1637 05/11/20 2259  Weight: 56.7 kg 54.6 kg    Examination:  EOMI NCAT no focal deficit frail cachectic slightly odd affect CTA B no added sound Abdomen soft no rebound no guarding ROM intact to major joints Psych euthymic Power 5/5  Data Reviewed: personally reviewed   Potassium 2.8-->3.0 CO2 17-->17 BUN/creatinine 28/3.4-->23/2.4 Calcium 7.7 Hemoglobin 10.7 platelet 371   Radiology Studies: CT Abdomen Pelvis Wo Contrast  Result Date: 05/11/2020 CLINICAL DATA:  60 year old female with abdominal pain. EXAM: CT ABDOMEN AND PELVIS WITHOUT CONTRAST TECHNIQUE: Multidetector CT imaging of the abdomen and pelvis was performed following the standard protocol without IV contrast. COMPARISON:  CT abdomen pelvis dated 10/30/2019 abdominal radiograph dated 02/21/2020. FINDINGS: Evaluation of this exam is limited in the absence of intravenous contrast. Lower chest: The visualized lung bases are clear. No  intra-abdominal free air or free fluid. Hepatobiliary: The liver is unremarkable. No intrahepatic biliary dilatation. Multiple stones noted in the gallbladder. No pericholecystic fluid or evidence of acute cholecystitis by CT. Pancreas: The pancreas is suboptimally visualized but grossly unremarkable. No active inflammatory changes. Spleen: Normal in size without focal abnormality. Adrenals/Urinary Tract: The adrenal glands unremarkable. The kidneys, and the visualized ureters appear unremarkable. There is apparent focal area thickening of the left posterior bladder wall measuring approximately 5 mm in thickness (66/3 and 57/6). This area is suboptimally evaluated by CT. A urothelial lesion is not excluded. Further evaluation with cystoscopy is recommended. Stomach/Bowel: There is sigmoid diverticulosis and scattered colonic diverticula without active inflammatory changes. There is no bowel obstruction or active inflammation. The appendix is normal. Vascular/Lymphatic: Mild aortoiliac atherosclerotic disease. The IVC is unremarkable. No portal venous gas. There is no adenopathy. Reproductive: Hysterectomy. There is an 8 cm cystic lesion arising from the left adnexa. Because this lesion is not adequately characterized, prompt [name] is recommended for further evaluation. Note: This recommendation does not apply to premenarchal patients and to those with increased risk (genetic, family history, elevated tumor markers or other high-risk factors) of ovarian cancer. Reference: JACR 2020 Feb; 17(2):248-254 Other: Mild diffuse subcutaneous stranding. No fluid collection. Musculoskeletal: Osteopenia with degenerative changes of the spine. Grade 1 L3-L4 anterolisthesis. Multilevel facet arthropathy. No acute osseous pathology. IMPRESSION: 1. No acute intra-abdominal or pelvic pathology. 2. Colonic diverticulosis. No bowel obstruction. Normal appendix. 3. Cholelithiasis. 4. A 8 cm left adnexal cystic lesion. Further evaluation  with ultrasound is recommended. 5. Aortic Atherosclerosis (ICD10-I70.0). Electronically Signed   By: Anner Crete M.D.   On: 05/11/2020 15:58     Scheduled Meds: . asenapine  10 mg Sublingual BID  . atorvastatin  20 mg Oral Daily  . diclofenac Sodium  4 g Topical TID AC & HS  . escitalopram  20 mg Oral Daily  . ferrous sulfate  325 mg Oral Q breakfast  . fluticasone furoate-vilanterol  1 puff Inhalation Daily   And  . umeclidinium bromide  1 puff Inhalation Daily  . folic acid  1 mg Oral Daily  . gabapentin  300 mg Oral BID  . heparin  5,000 Units Subcutaneous Q8H  . insulin aspart  0-9 Units Subcutaneous TID WC  . lisdexamfetamine  20 mg Oral Q breakfast  . montelukast  10 mg Oral QHS  . oxybutynin  5 mg Oral BID  . pantoprazole  40 mg Oral Daily  . prazosin  2 mg Oral QHS  . tiZANidine  4 mg Oral TID  . topiramate  25 mg Oral BID   Continuous Infusions: . lactated ringers 1,000 mL with potassium chloride 20 mEq infusion       LOS: 1 day   Time spent: [place], MD [facility] To contact the attending provider between 7A-7P or the covering provider during after hours 7P-7A, please log into the web site [email or web address] and access using universal [place] password for that web site. If you do not have the password, please call the hospital operator.  05/12/2020, 1:33 PM

## 2020-05-12 NOTE — Progress Notes (Signed)
SLP Cancellation Note  Patient Details Name: Tiffany [name] MRN: [id number] DOB: Dec 01, 1960   Cancelled treatment:       Reason Eval/Treat Not Completed: SLP screened. Order received for MBS, but per MD note on previous date, an esophageal assessment is wanted (barium swallow/esophagram). Will defer our study at this time. MD made aware. Please reorder SLP if w/u for oropharyngeal swallowing is also indicated.     Osie Bond., M.A. [facility] Pager [phone] Office [phone]  05/12/2020, 8:41 AM

## 2020-05-12 NOTE — Progress Notes (Signed)
Pt reused esophagram earlier today.  She thought this would require sedation. I explained to pt that esophagram and UGI series do not require sedation and she is agreeable.  Fluoro not available to perform studies today but have pt on schedule for tmrw.   Azucena Freed PA-C

## 2020-05-12 NOTE — Hospital Course (Addendum)
Bun/creat down to 13/1.73 CL 114 CO2 18 Troponin trend is flat @ 14  ECHO IMPRESSIONS     1. Left ventricular ejection fraction, by estimation, is 55 to 60%. The  left ventricle has normal function. The left ventricle has no regional  wall motion abnormalities. Left ventricular diastolic parameters are  consistent with Grade I diastolic  dysfunction (impaired relaxation).   2. Right ventricular systolic function is normal. The right ventricular  size is normal. There is normal pulmonary artery systolic pressure. The  estimated right ventricular systolic pressure is 00.9 mmHg.   3. Left atrial size was mildly dilated.   4. The mitral valve is grossly normal. Trivial mitral valve  regurgitation. No evidence of mitral stenosis.   5. The aortic valve is tricuspid. Aortic valve regurgitation is not  visualized. No aortic stenosis is present.   6. The inferior vena cava is normal in size with greater than 50%  respiratory variability, suggesting right atrial pressure of 3 mmHg.    he also states that she was "allergic to anesthesia "needed to have several cardiac studies done to make sure that the machines kept her alive".   I do not believe that she has had a prior history of CAD based upon this prior historical data.     History of cocaine use  - In June 2017 urine was positive for cocaine.  - Encourage continued cessation.

## 2020-05-12 NOTE — Telephone Encounter (Signed)
Received fax from [facility] requiring a PA on pt's Saphris 10mg  sublingual tablet. SPOKE Tiffany [name] AT [facility] WHO STATED A PA IS NOT REQUIRED - REF # [id number]. Patient had been taking her Saphris TID at one time but per progress note from 4/18 visit with dr., she was instructed to decrease from TID to BID. I informed pharmacy that PA was not required per [facility] & that she is now on Saphris 10mg  BID. Pharmacist ran it & put it through for #60 0 refills and it went through w/out the pa

## 2020-05-13 ENCOUNTER — Inpatient Hospital Stay (HOSPITAL_COMMUNITY): Payer: 59

## 2020-05-13 DIAGNOSIS — I462 Cardiac arrest due to underlying cardiac condition: Secondary | ICD-10-CM | POA: Diagnosis not present

## 2020-05-13 DIAGNOSIS — K222 Esophageal obstruction: Secondary | ICD-10-CM | POA: Diagnosis not present

## 2020-05-13 DIAGNOSIS — R001 Bradycardia, unspecified: Secondary | ICD-10-CM

## 2020-05-13 DIAGNOSIS — K229 Disease of esophagus, unspecified: Secondary | ICD-10-CM

## 2020-05-13 DIAGNOSIS — E440 Moderate protein-calorie malnutrition: Secondary | ICD-10-CM | POA: Insufficient documentation

## 2020-05-13 DIAGNOSIS — I5032 Chronic diastolic (congestive) heart failure: Secondary | ICD-10-CM | POA: Diagnosis not present

## 2020-05-13 DIAGNOSIS — F250 Schizoaffective disorder, bipolar type: Secondary | ICD-10-CM | POA: Diagnosis not present

## 2020-05-13 LAB — GLUCOSE, CAPILLARY
Glucose-Capillary: 109 mg/dL — ABNORMAL HIGH (ref 70–99)
Glucose-Capillary: 72 mg/dL (ref 70–99)
Glucose-Capillary: 73 mg/dL (ref 70–99)
Glucose-Capillary: 84 mg/dL (ref 70–99)

## 2020-05-13 LAB — ECHOCARDIOGRAM COMPLETE
Area-P 1/2: 2.44 cm2
Height: 63 in
S' Lateral: 3.2 cm
Weight: 1925.94 oz

## 2020-05-13 LAB — CBC WITH DIFFERENTIAL/PLATELET
Abs Immature Granulocytes: 0.01 10*3/uL (ref 0.00–0.07)
Basophils Absolute: 0 10*3/uL (ref 0.0–0.1)
Basophils Relative: 1 %
Eosinophils Absolute: 0.1 10*3/uL (ref 0.0–0.5)
Eosinophils Relative: 2 %
HCT: 28.8 % — ABNORMAL LOW (ref 36.0–46.0)
Hemoglobin: 9.3 g/dL — ABNORMAL LOW (ref 12.0–15.0)
Immature Granulocytes: 0 %
Lymphocytes Relative: 35 %
Lymphs Abs: 1.4 10*3/uL (ref 0.7–4.0)
MCH: 28.2 pg (ref 26.0–34.0)
MCHC: 32.3 g/dL (ref 30.0–36.0)
MCV: 87.3 fL (ref 80.0–100.0)
Monocytes Absolute: 0.3 10*3/uL (ref 0.1–1.0)
Monocytes Relative: 7 %
Neutro Abs: 2.2 10*3/uL (ref 1.7–7.7)
Neutrophils Relative %: 55 %
Platelets: 348 10*3/uL (ref 150–400)
RBC: 3.3 MIL/uL — ABNORMAL LOW (ref 3.87–5.11)
RDW: 17.1 % — ABNORMAL HIGH (ref 11.5–15.5)
WBC: 4 10*3/uL (ref 4.0–10.5)
nRBC: 0 % (ref 0.0–0.2)

## 2020-05-13 LAB — COMPREHENSIVE METABOLIC PANEL
ALT: 9 U/L (ref 0–44)
AST: 17 U/L (ref 15–41)
Albumin: 2.1 g/dL — ABNORMAL LOW (ref 3.5–5.0)
Alkaline Phosphatase: 75 U/L (ref 38–126)
Anion gap: 4 — ABNORMAL LOW (ref 5–15)
BUN: 19 mg/dL (ref 8–23)
CO2: 17 mmol/L — ABNORMAL LOW (ref 22–32)
Calcium: 8.1 mg/dL — ABNORMAL LOW (ref 8.9–10.3)
Chloride: 115 mmol/L — ABNORMAL HIGH (ref 98–111)
Creatinine, Ser: 2.06 mg/dL — ABNORMAL HIGH (ref 0.44–1.00)
GFR, Estimated: 27 mL/min — ABNORMAL LOW (ref >60)
Glucose, Bld: 91 mg/dL (ref 70–99)
Potassium: 3.5 mmol/L (ref 3.5–5.1)
Sodium: 136 mmol/L (ref 135–145)
Total Bilirubin: 0.4 mg/dL (ref 0.3–1.2)
Total Protein: 3.8 g/dL — ABNORMAL LOW (ref 6.5–8.1)

## 2020-05-13 LAB — MAGNESIUM: Magnesium: 2.1 mg/dL (ref 1.7–2.4)

## 2020-05-13 LAB — TROPONIN I (HIGH SENSITIVITY)
Troponin I (High Sensitivity): 14 ng/L (ref <18)
Troponin I (High Sensitivity): 14 ng/L (ref <18)

## 2020-05-13 MED ORDER — SODIUM BICARBONATE 8.4 % IV SOLN
INTRAVENOUS | Status: DC
  Filled 2020-05-13 (×2): qty 1000

## 2020-05-13 MED ORDER — ENSURE ENLIVE PO LIQD: 237.0000 mL | Freq: Two times a day (BID) | ORAL | Status: DC

## 2020-05-13 MED ORDER — ADULT MULTIVITAMIN W/MINERALS CH
1.0000 | ORAL_TABLET | Freq: Every day | ORAL | Status: DC
  Filled 2020-05-13: qty 1

## 2020-05-13 MED ORDER — SODIUM CHLORIDE 0.9 % IV SOLN: INTRAVENOUS | Status: DC

## 2020-05-13 MED ORDER — POTASSIUM CL IN DEXTROSE 5% 20 MEQ/L IV SOLN
20.0000 meq | INTRAVENOUS | Status: DC
  Filled 2020-05-13: qty 1000

## 2020-05-13 MED ORDER — INSULIN ASPART 100 UNIT/ML ~~LOC~~ SOLN: 0.0000 [IU] | SUBCUTANEOUS | Status: DC

## 2020-05-13 MED ORDER — ATROPINE SULFATE 1 MG/10ML IJ SOSY
PREFILLED_SYRINGE | INTRAMUSCULAR | Status: AC
  Administered 2020-05-13: 0.5 mg
  Filled 2020-05-13: qty 10

## 2020-05-13 MED ORDER — SODIUM BICARBONATE 650 MG PO TABS
1300.0000 mg | ORAL_TABLET | Freq: Three times a day (TID) | ORAL | Status: DC
  Administered 2020-05-13 (×2): 1300 mg via ORAL
  Filled 2020-05-13 (×3): qty 2

## 2020-05-13 NOTE — Consult Note (Signed)
CARDIOLOGY CONSULT NOTE       Patient ID: Tiffany [name] MRN: [id number] DOB/AGE: 1960/01/25 60 y.o.  Admit date: 05/11/2020 Referring Physician: Verlon Au Primary Physician: [name], MD Primary Cardiologist: Radford Pax Reason for Consultation: Bradycardia  Principal Problem:   Schizoaffective disorder, bipolar type ([place]) Active Problems:   AKI (acute kidney injury) ([place])   Periumbilical abdominal pain   Dysphagia   HPI:  60 y.o. asked to be seen emergently by Dr Verlon Au for bradycardia. Patient admitted 4/26 with nausea, vomiting and diarrhea with abdominal pain Past history active schizoaffective disease, IDDM, HTN , HLD and severe COPD followed by Dr Lamonte Sakai. CT abdomen no acute findings Upper GI suggested distal esophageal stricture. LFTls ok She carries a diagnosis of diastolic dysfunction Echo 2018 normal EF She had cath with Dr Jeffie Pollock 06/19/17 with no CAD and only mildly elevated pulmonary pressures  Review of telemetry shows no heart block HR;s vary between 38 and 72 bpm For the most part she is asymptomatic Dr Verlon Au indicated she had brief Hypotension but she has systolic BP 256-389 mmHg in room for me  She denies SSCP, palpitations, syncope  Renal function is abnormal but improved Cr 3.48->2.06 and K is not elevated At 3.5   ROS All other systems reviewed and negative except as noted above  Past Medical History:  Diagnosis Date  . Agitation 11/22/2017  . Anoxic brain injury ([place]) 09/08/2016   C. Arrest due to respiratory failure and COPD exacerbation  . Anxiety   . Arthritis    "all over" (04/10/2016)  . Asthma 10/18/2010  . Binge eating disorder   . Cardiac arrest ([place]) 09/08/2016   PEA  . Carotid artery stenosis    1-39% bilateral by dopplers 11/2016  . Chronic diastolic (congestive) heart failure ([place])   . Chronic kidney disease    "I see a kidney dr." (04/10/2016)  . Chronic pain syndrome 06/18/2012  . Chronic post-traumatic stress disorder (PTSD)  05/27/2018  . Chronic respiratory failure with hypoxia and hypercapnia (HCC) 06/22/2015   TRILOGY Vent >AVAPA-ES., Vt target 200-400, Max P 30 , PS max 20 , PS min 6-10 , E Max 6, E Min 4, Rate Auto AVAPS Rate 2 (titrate for pt comfort) , bleed O2 at 5l/m continuous flow .   [name] CKD (chronic kidney disease) stage 3, GFR 30-59 ml/min (HCC) 12/15/2016  . Closed displaced fracture of fifth metacarpal bone 03/21/2018  . Cocaine use disorder, severe, in sustained remission ([place]) 12/17/2015  . Complication of anesthesia    decreased bp, decreased heart rate  . COPD (chronic obstructive pulmonary disease) ([place]) 07/08/2014  . Depression   . Diabetic neuropathy ([place]) 04/24/2011  . Difficulty with speech 01/24/2018  . Disorder of nervous system   . Drug abuse ([place]) 11/21/2017  . Dyslipidemia 04/24/2011  . Elevated troponin 04/28/2012  . Emphysema   . Encephalopathy 11/21/2017  . Essential hypertension 03/22/2016  . Fibula fracture 07/10/2016  . Frequent falls 10/11/2017  . GERD (gastroesophageal reflux disease)   . Gout 04/11/2017  . Heart attack ([place]) 1980s  . History of blood transfusion 1994   "couldn't stop bleeding from my period"  . History of drug abuse in remission ([place]) 11/28/2015   Quit in 2017  . Hyperlipidemia LDL goal <70   . Incontinence   . Manic depression ([place])   . Morbid obesity ([place]) 10/18/2010  . Obstructive sleep apnea 10/18/2010  . On home oxygen therapy    "6L; 24/7" (04/10/2016)  . OSA on  CPAP    "wear mask sometimes" (04/10/2016)  . Paranoid ([place])    "sometimes; I'm on RX for it" (04/10/2016)  . Prolonged Q-T interval on ECG   . Rectal bleeding 12/31/2015  . Right carotid bruit 11/09/2016  . Schizoaffective disorder, bipolar type ([place]) 04/05/2018  . Seasonal allergies   . Seborrheic keratoses 12/31/2013  . Seizures ([place])    "don't know what kind; last one was ~ 1 yr ago" (04/10/2016)  . Stroke [facility]) 1980s   denies residual on 04/10/2016  . Thrush 09/19/2013  . Type 2 diabetes mellitus  ([place]) 10/18/2010    Family History  Problem Relation Age of Onset  . Cancer Father        prostate  . Cancer Mother        lung  . Depression Mother   . Depression Sister   . Anxiety disorder Sister   . Schizophrenia Sister   . Bipolar disorder Sister   . Depression Sister   . Depression Brother   . Heart failure Other        cousin    Social History   Socioeconomic History  . Marital status: Widowed    Spouse name: Not on file  . Number of children: 3  . Years of education: Not on file  . Highest education level: Not on file  Occupational History  . Occupation: disabled    Comment: factory production  Tobacco Use  . Smoking status: Former Smoker    Packs/day: 1.50    Years: 38.00    Pack years: 57.00    Types: Cigarettes    Start date: 03/13/1977    Quit date: 04/10/2016    Years since quitting: 4.0  . Smokeless tobacco: Never Used  Vaping Use  . Vaping Use: Never used  Substance and Sexual Activity  . Alcohol use: No    Alcohol/week: 0.0 standard drinks  . Drug use: No    Types: Cocaine    Comment: 04/10/2016 "last used cocaine back in November 2017"  . Sexual activity: Not Currently    Birth control/protection: Surgical  Other Topics Concern  . Not on file  Social History Narrative   Has 1 son, [name]   Lives with son and his boyfriend   Her house has ramps and handrails should she ever needs them.    Her mother lives down the street from her and is a good support person in addition to her son.   She drives herself, has private transportation.    Cocaine free since 02/24/16, smoke free since 04/10/16   Social Determinants of Health   Financial Resource Strain: Not on file  Food Insecurity: Not on file  Transportation Needs: Not on file  Physical Activity: Not on file  Stress: Not on file  Social Connections: Not on file  Intimate Partner Violence: Not on file    Past Surgical History:  Procedure Laterality Date  . CESAREAN SECTION  1997  . HERNIA  REPAIR    . IR CHOLANGIOGRAM EXISTING TUBE  07/20/2016  . IR PERC CHOLECYSTOSTOMY  05/10/2016  . IR RADIOLOGIST EVAL & MGMT  06/08/2016  . IR RADIOLOGIST EVAL & MGMT  06/29/2016  . IR SINUS/FIST TUBE CHK-NON GI  07/12/2016  . RIGHT/LEFT HEART CATH AND CORONARY ANGIOGRAPHY N/A 06/19/2017   Procedure: RIGHT/LEFT HEART CATH AND CORONARY ANGIOGRAPHY;  Surgeon: Jolaine Artist, MD;  Location: [facility];  Service: Cardiovascular;  Laterality: N/A;  . TIBIA IM NAIL INSERTION Right 07/12/2016  Procedure: INTRAMEDULLARY (IM) NAIL RIGHT TIBIA;  Surgeon: Leandrew Koyanagi, MD;  Location: [facility];  Service: Orthopedics;  Laterality: Right;  . UMBILICAL HERNIA REPAIR  ~ 1963   "that's why I don't have a belly button"  . VAGINAL HYSTERECTOMY        Current Facility-Administered Medications:  .  0.9 %  sodium chloride infusion, , Intravenous, Continuous, Samtani, Jai-Gurmukh, MD .  albuterol (PROVENTIL) (2.5 MG/3ML) 0.083% nebulizer solution 2.5 mg, 2.5 mg, Nebulization, Q6H PRN, Wynetta Fines T, MD .  asenapine (SAPHRIS) sublingual tablet 10 mg, 10 mg, Sublingual, BID, Zhang, Ping T, MD .  atorvastatin (LIPITOR) tablet 20 mg, 20 mg, Oral, Daily, Wynetta Fines T, MD, 20 mg at 05/13/20 1009 .  dextrose 5 % 1,000 mL with potassium chloride 20 mEq/L, sodium bicarbonate 50 mEq Pediatric IV infusion, , Intravenous, Continuous, Samtani, Jai-Gurmukh, MD .  diazepam (VALIUM) tablet 10 mg, 10 mg, Oral, TID PRN, Wynetta Fines T, MD .  diclofenac Sodium (VOLTAREN) 1 % topical gel 4 g, 4 g, Topical, TID AC & HS, Wynetta Fines T, MD, 4 g at 05/13/20 9371 .  escitalopram (LEXAPRO) tablet 20 mg, 20 mg, Oral, Daily, Wynetta Fines T, MD, 20 mg at 05/13/20 1009 .  fluticasone furoate-vilanterol (BREO ELLIPTA) 100-25 MCG/INH 1 puff, 1 puff, Inhalation, Daily **AND** umeclidinium bromide (INCRUSE ELLIPTA) 62.5 MCG/INH 1 puff, 1 puff, Inhalation, Daily, Zhang, Ping T, MD .  folic acid (FOLVITE) tablet 1 mg, 1 mg, Oral, Daily, Wynetta Fines T, MD, 1 mg at 05/13/20 1009 .  heparin injection 5,000 Units, 5,000 Units, Subcutaneous, Q8H, Lequita Halt, MD, 5,000 Units at 05/13/20 0622 .  insulin aspart (novoLOG) injection 0-9 Units, 0-9 Units, Subcutaneous, Q4H, Samtani, Jai-Gurmukh, MD .  lisdexamfetamine (VYVANSE) capsule 20 mg, 20 mg, Oral, Q breakfast, Zhang, Ping T, MD .  montelukast (SINGULAIR) tablet 10 mg, 10 mg, Oral, QHS, Wynetta Fines T, MD, 10 mg at 05/12/20 2114 .  ondansetron (ZOFRAN) tablet 4 mg, 4 mg, Oral, Q6H PRN **OR** ondansetron (ZOFRAN) injection 4 mg, 4 mg, Intravenous, Q6H PRN, Wynetta Fines T, MD .  oxybutynin (DITROPAN) tablet 5 mg, 5 mg, Oral, BID, Wynetta Fines T, MD, 5 mg at 05/13/20 1010 .  pantoprazole (PROTONIX) EC tablet 40 mg, 40 mg, Oral, Daily, Wynetta Fines T, MD, 40 mg at 05/13/20 1009 .  prazosin (MINIPRESS) capsule 2 mg, 2 mg, Oral, QHS, Wynetta Fines T, MD, 2 mg at 05/12/20 2115 .  silver sulfADIAZINE (SILVADENE) 1 % cream, , Topical, Daily, Nita Sells, MD, Given at 05/13/20 1018 .  sodium bicarbonate tablet 1,300 mg, 1,300 mg, Oral, TID, Samtani, Jai-Gurmukh, MD .  topiramate (TOPAMAX) tablet 25 mg, 25 mg, Oral, BID, Wynetta Fines T, MD, 25 mg at 05/13/20 1010 . asenapine  10 mg Sublingual BID  . atorvastatin  20 mg Oral Daily  . diclofenac Sodium  4 g Topical TID AC & HS  . escitalopram  20 mg Oral Daily  . fluticasone furoate-vilanterol  1 puff Inhalation Daily   And  . umeclidinium bromide  1 puff Inhalation Daily  . folic acid  1 mg Oral Daily  . heparin  5,000 Units Subcutaneous Q8H  . insulin aspart  0-9 Units Subcutaneous Q4H  . lisdexamfetamine  20 mg Oral Q breakfast  . montelukast  10 mg Oral QHS  . oxybutynin  5 mg Oral BID  . pantoprazole  40 mg Oral Daily  . prazosin  2 mg Oral QHS  .  silver sulfADIAZINE   Topical Daily  . sodium bicarbonate  1,300 mg Oral TID  . topiramate  25 mg Oral BID   . sodium chloride    . dextrose 5 % with KCl/Additives Pediatric custom IV  fluid      Physical Exam: Blood pressure 116/88, pulse (!) 46, temperature 98.4 F (36.9 C), temperature source Oral, resp. rate 15, height 5\' 3"  (1.6 m), weight 54.6 kg, SpO2 99 %.   Affect  A bit tangential  Chronically ill black female  HEENT: normal Neck supple with no adenopathy JVP normal no bruits no thyromegaly Lungs clear with no wheezing and good diaphragmatic motion Heart:  S1/S2 no murmur, no rub, gallop or click PMI normal Abdomen: benighn, BS positve, no tenderness, no AAA no bruit.  No HSM or HJR Distal pulses intact with no bruits No edema Neuro non-focal Skin warm and dry No muscular weakness   Labs:   Lab Results  Component Value Date   WBC 7.0 05/11/2020   HGB 10.7 (L) 05/11/2020   HCT 34.6 (L) 05/11/2020   MCV 89.4 05/11/2020   PLT 371 05/11/2020    Recent Labs  Lab 05/13/20 0037  NA 136  K 3.5  CL 115*  CO2 17*  BUN 19  CREATININE 2.06*  CALCIUM 8.1*  PROT 3.8*  BILITOT 0.4  ALKPHOS 75  ALT 9  AST 17  GLUCOSE 91   Lab Results  Component Value Date   CKTOTAL 505 (H) 02/18/2020   CKMB 3.0 04/25/2011   TROPONINI <0.03 01/07/2017    Lab Results  Component Value Date   CHOL 127 08/05/2018   CHOL 129 01/07/2017   CHOL 113 07/10/2016   Lab Results  Component Value Date   HDL 62 08/05/2018   HDL 35 (L) 01/07/2017   HDL 51 07/10/2016   Lab Results  Component Value Date   LDLCALC 54 08/05/2018   LDLCALC 56 01/07/2017   LDLCALC 44 07/10/2016   Lab Results  Component Value Date   TRIG 53 08/05/2018   TRIG 121 11/21/2017   TRIG 188 (H) 01/07/2017   Lab Results  Component Value Date   CHOLHDL 2.0 08/05/2018   CHOLHDL 3.7 01/07/2017   CHOLHDL 2.2 07/10/2016   Lab Results  Component Value Date   LDLDIRECT 52 04/05/2012      Radiology: CT Abdomen Pelvis Wo Contrast  Result Date: 05/11/2020 CLINICAL DATA:  60 year old female with abdominal pain. EXAM: CT ABDOMEN AND PELVIS WITHOUT CONTRAST TECHNIQUE: Multidetector CT  imaging of the abdomen and pelvis was performed following the standard protocol without IV contrast. COMPARISON:  CT abdomen pelvis dated 10/30/2019 abdominal radiograph dated 02/21/2020. FINDINGS: Evaluation of this exam is limited in the absence of intravenous contrast. Lower chest: The visualized lung bases are clear. No intra-abdominal free air or free fluid. Hepatobiliary: The liver is unremarkable. No intrahepatic biliary dilatation. Multiple stones noted in the gallbladder. No pericholecystic fluid or evidence of acute cholecystitis by CT. Pancreas: The pancreas is suboptimally visualized but grossly unremarkable. No active inflammatory changes. Spleen: Normal in size without focal abnormality. Adrenals/Urinary Tract: The adrenal glands unremarkable. The kidneys, and the visualized ureters appear unremarkable. There is apparent focal area thickening of the left posterior bladder wall measuring approximately 5 mm in thickness (66/3 and 57/6). This area is suboptimally evaluated by CT. A urothelial lesion is not excluded. Further evaluation with cystoscopy is recommended. Stomach/Bowel: There is sigmoid diverticulosis and scattered colonic diverticula without active inflammatory changes. There is no  bowel obstruction or active inflammation. The appendix is normal. Vascular/Lymphatic: Mild aortoiliac atherosclerotic disease. The IVC is unremarkable. No portal venous gas. There is no adenopathy. Reproductive: Hysterectomy. There is an 8 cm cystic lesion arising from the left adnexa. Because this lesion is not adequately characterized, prompt [name] is recommended for further evaluation. Note: This recommendation does not apply to premenarchal patients and to those with increased risk (genetic, family history, elevated tumor markers or other high-risk factors) of ovarian cancer. Reference: JACR 2020 Feb; 17(2):248-254 Other: Mild diffuse subcutaneous stranding. No fluid collection. Musculoskeletal: Osteopenia with  degenerative changes of the spine. Grade 1 L3-L4 anterolisthesis. Multilevel facet arthropathy. No acute osseous pathology. IMPRESSION: 1. No acute intra-abdominal or pelvic pathology. 2. Colonic diverticulosis. No bowel obstruction. Normal appendix. 3. Cholelithiasis. 4. A 8 cm left adnexal cystic lesion. Further evaluation with ultrasound is recommended. 5. Aortic Atherosclerosis (ICD10-I70.0). Electronically Signed   By: Anner Crete M.D.   On: 05/11/2020 15:58   DG UGI W SINGLE CM (SOL OR THIN BA)  Result Date: 05/13/2020 CLINICAL DATA:  Nausea and vomiting. EXAM: UPPER GI SERIES WITH KUB TECHNIQUE: After obtaining a scout radiograph a routine upper GI series was performed using thin and high density barium. FLUOROSCOPY TIME:  Fluoroscopy Time:  36 seconds Radiation Exposure Index (if provided by the fluoroscopic device): 46.10 mGy Number of Acquired Spot Images: 2 COMPARISON:  CT abdomen/pelvis 05/11/2020. FINDINGS: The examination is somewhat limited due to limited patient positioning. A scout radiograph of the abdomen demonstrates a nonobstructive bowel gas pattern. A round metallic foreign body projected over the left abdomen. This reflected a coin overlying the patient, which was subsequently removed. Slightly patulous esophagus. Prominent intermittent esophageal dysmotility with tertiary contractions. There is no appreciable fixed high-grade esophageal stricture with the patient drinking liquid barium. However, a swallowed 13 mm barium tablet did not pass beyond the level of the distal esophagus despite a prolonged period of observation, and this may reflect a mild smooth stricture at this site. Small hiatal hernia. Moderate volume gastroesophageal reflux observed to the level of the mid esophagus. Unremarkable appearance of the stomach, duodenal bulb and duodenal sweep. No definite evidence of ulceration, fold thickening or mass. IMPRESSION: Somewhat limited examination due to limited patient  positioning. Mildly patulous esophagus. Prominent intermittent esophageal dysmotility with tertiary contractions. A swallowed 13 mm barium tablet did not pass beyond the level of the distal esophagus, despite a prolonged period of observation, and this may reflect a mild smooth stricture at this site. Endoscopy should be considered for further evaluation. Moderate-volume gastroesophageal reflux observed to the level of the mid esophagus. Small hiatal hernia. Electronically Signed   By: Kellie Simmering DO   On: 05/13/2020 09:05    EKG: SB rate 38 no AV block no ischemic changes normal intervals and QT   ASSESSMENT AND PLAN:   1. Bradycardia:  Increased vagal tone with nausea and GI issues Not on any AV nodal drugs, K, alright Not clear why she is acidotic Dr Verlon Au addressing At bedside given 0.5 mg atropine and HR increased to 55 bpm range. She has no cardiac symptoms. Will check echo and troponin's continue to monitor No indication for pacing. NS 100 cc/Hr  2. COPD:  Severe sats ok  CXR this admission NAD  3. GI:  Dr Henrene Pastor has seen  ? Need for EGD with esophageal dilatation. Would consider using Robinul for procedure to dry secretions but more importantly to elevate HR a bit   Signed: Jenkins Rouge  05/13/2020, 12:33 PM

## 2020-05-13 NOTE — Progress Notes (Signed)
  Echocardiogram 2D Echocardiogram has been performed.  Tiffany [name] 05/13/2020, 1:34 PM
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
## 2020-05-13 NOTE — Progress Notes (Signed)
Attempted to obtain PIV access. IV catheter was halfway into the vein when the pt began yelling to stop. I immediately removed the catheter and pt refused another attempt. Primary RN at bedside and pt refused the Primary RN to stick at this time. RN to report to MD.
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
## 2020-05-13 NOTE — Progress Notes (Signed)
Pt refuses ambulation.  States her foot hurts from blister and is unable to walk with it.
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
## 2020-05-13 NOTE — Care Management Important Message (Signed)
Important Message  Patient Details  Name: Tiffany [name] MRN: [id number] Date of Birth: [birth year]   Medicare Important Message Given:  Yes     Maurine Mowbray Montine Circle 05/13/2020, 11:49 AM
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
## 2020-05-13 NOTE — Progress Notes (Signed)
RN alerted MD that pt's HR was in the upper 30s/low 40s. MDs came immediately to bedside. MD gave order for RN to give 0.5 mg Atropine, which RN followed. MDs stayed at bedside to monitor pt. Pt's HR back into the 50s. RN continuing to monitor pt closely.

## 2020-05-13 NOTE — Plan of Care (Signed)
  Problem: Education: Goal: Knowledge of General Education information will improve Description: Including pain rating scale, medication(s)/side effects and non-pharmacologic comfort measures Outcome: Progressing   Problem: Health Behavior/Discharge Planning: Goal: Ability to manage health-related needs will improve Outcome: Progressing   Problem: Clinical Measurements: Goal: Ability to maintain clinical measurements within normal limits will improve Outcome: Progressing Goal: Will remain free from infection Outcome: Progressing Goal: Respiratory complications will improve Outcome: Progressing   
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
## 2020-05-14 ENCOUNTER — Inpatient Hospital Stay (HOSPITAL_COMMUNITY): Payer: 59

## 2020-05-14 DIAGNOSIS — F250 Schizoaffective disorder, bipolar type: Secondary | ICD-10-CM | POA: Diagnosis not present

## 2020-05-14 DIAGNOSIS — R001 Bradycardia, unspecified: Secondary | ICD-10-CM | POA: Diagnosis not present

## 2020-05-14 LAB — COMPREHENSIVE METABOLIC PANEL
ALT: 10 U/L (ref 0–44)
AST: 18 U/L (ref 15–41)
Albumin: 2.1 g/dL — ABNORMAL LOW (ref 3.5–5.0)
Alkaline Phosphatase: 73 U/L (ref 38–126)
Anion gap: 5 (ref 5–15)
BUN: 13 mg/dL (ref 8–23)
CO2: 18 mmol/L — ABNORMAL LOW (ref 22–32)
Calcium: 8.2 mg/dL — ABNORMAL LOW (ref 8.9–10.3)
Chloride: 114 mmol/L — ABNORMAL HIGH (ref 98–111)
Creatinine, Ser: 1.73 mg/dL — ABNORMAL HIGH (ref 0.44–1.00)
GFR, Estimated: 33 mL/min — ABNORMAL LOW (ref >60)
Glucose, Bld: 109 mg/dL — ABNORMAL HIGH (ref 70–99)
Potassium: 3.6 mmol/L (ref 3.5–5.1)
Sodium: 137 mmol/L (ref 135–145)
Total Bilirubin: 0.4 mg/dL (ref 0.3–1.2)
Total Protein: 3.8 g/dL — ABNORMAL LOW (ref 6.5–8.1)

## 2020-05-14 LAB — GLUCOSE, CAPILLARY
Glucose-Capillary: 81 mg/dL (ref 70–99)
Glucose-Capillary: 102 mg/dL — ABNORMAL HIGH (ref 70–99)
Glucose-Capillary: 144 mg/dL — ABNORMAL HIGH (ref 70–99)
Glucose-Capillary: 68 mg/dL — ABNORMAL LOW (ref 70–99)
Glucose-Capillary: 78 mg/dL (ref 70–99)
Glucose-Capillary: 73 mg/dL (ref 70–99)

## 2020-05-14 LAB — RAPID URINE DRUG SCREEN, HOSP PERFORMED
Amphetamines: NOT DETECTED
Barbiturates: NOT DETECTED
Benzodiazepines: POSITIVE — AB
Cocaine: NOT DETECTED
Opiates: NOT DETECTED
Tetrahydrocannabinol: NOT DETECTED

## 2020-05-14 LAB — CORTISOL: Cortisol, Plasma: 6.2 ug/dL

## 2020-05-14 LAB — MAGNESIUM
Magnesium: 1.7 mg/dL (ref 1.7–2.4)
Magnesium: 1.7 mg/dL (ref 1.7–2.4)

## 2020-05-14 LAB — BLOOD GAS, ARTERIAL
Acid-base deficit: 7 mmol/L — ABNORMAL HIGH (ref 0.0–2.0)
Bicarbonate: 17.7 mmol/L — ABNORMAL LOW (ref 20.0–28.0)
Drawn by: 35849
FIO2: 21
O2 Saturation: 96.9 %
Patient temperature: 36.7
pCO2 arterial: 33.2 mmHg (ref 32.0–48.0)
pH, Arterial: 7.344 — ABNORMAL LOW (ref 7.350–7.450)
pO2, Arterial: 92.6 mmHg (ref 83.0–108.0)

## 2020-05-14 LAB — VITAMIN B12: Vitamin B-12: 554 pg/mL (ref 180–914)

## 2020-05-14 LAB — SARS CORONAVIRUS 2 (TAT 6-24 HRS): SARS Coronavirus 2: NEGATIVE

## 2020-05-14 LAB — PHOSPHORUS: Phosphorus: 2.1 mg/dL — ABNORMAL LOW (ref 2.5–4.6)

## 2020-05-14 LAB — VITAMIN D 25 HYDROXY (VIT D DEFICIENCY, FRACTURES): Vit D, 25-Hydroxy: 33.11 ng/mL (ref 30–100)

## 2020-05-14 LAB — FOLATE: Folate: 7.8 ng/mL (ref >5.9)

## 2020-05-14 LAB — TSH: TSH: 0.832 u[IU]/mL (ref 0.350–4.500)

## 2020-05-14 MED ORDER — VITAL 1.5 CAL PO LIQD
1000.0000 mL | ORAL | Status: DC
  Administered 2020-05-14: 1000 mL
  Filled 2020-05-14 (×2): qty 1000

## 2020-05-14 MED ORDER — SODIUM BICARBONATE 8.4 % IV SOLN: INTRAVENOUS | Status: DC

## 2020-05-14 MED ORDER — THIAMINE HCL 100 MG PO TABS
100.0000 mg | ORAL_TABLET | Freq: Every day | ORAL | Status: DC
  Administered 2020-05-14 – 2020-05-16 (×3): 100 mg
  Filled 2020-05-14 (×3): qty 1

## 2020-05-14 MED ORDER — NALOXONE HCL 0.4 MG/ML IJ SOLN
INTRAMUSCULAR | Status: AC
  Administered 2020-05-14: 0.4 mg
  Filled 2020-05-14: qty 1

## 2020-05-14 MED ORDER — CHLORHEXIDINE GLUCONATE CLOTH 2 % EX PADS
6.0000 | MEDICATED_PAD | Freq: Every day | CUTANEOUS | Status: DC
  Administered 2020-05-14 – 2020-05-16 (×2): 6 via TOPICAL

## 2020-05-14 MED ORDER — DEXTROSE 50 % IV SOLN
INTRAVENOUS | Status: AC
  Administered 2020-05-14: 25 mL
  Filled 2020-05-14: qty 50

## 2020-05-14 MED ORDER — NALOXONE HCL 4 MG/10ML IJ SOLN
1.0000 mg/h | INTRAVENOUS | Status: DC
  Administered 2020-05-14 (×2): 1 mg/h via INTRAVENOUS
  Filled 2020-05-14 (×3): qty 10

## 2020-05-14 MED ORDER — ATROPINE SULFATE 1 MG/10ML IJ SOSY
0.5000 mg | PREFILLED_SYRINGE | INTRAMUSCULAR | Status: DC | PRN
  Filled 2020-05-14 (×2): qty 10

## 2020-05-14 MED ORDER — SODIUM BICARBONATE 8.4 % IV SOLN
INTRAVENOUS | Status: DC
  Filled 2020-05-14 (×5): qty 1000

## 2020-05-14 MED ORDER — NALOXONE HCL 0.4 MG/ML IJ SOLN
INTRAMUSCULAR | Status: AC
  Filled 2020-05-14: qty 1

## 2020-05-14 MED ORDER — PROSOURCE TF PO LIQD
45.0000 mL | Freq: Two times a day (BID) | ORAL | Status: DC
  Administered 2020-05-14: 45 mL
  Filled 2020-05-14 (×2): qty 45

## 2020-05-14 MED ORDER — ATROPINE SULFATE 1 MG/10ML IJ SOSY
PREFILLED_SYRINGE | INTRAMUSCULAR | Status: AC
  Administered 2020-05-14: 1 mg
  Filled 2020-05-14: qty 10

## 2020-05-14 NOTE — Progress Notes (Signed)
RN came in approximately before 10 am to give meds and found pt unresponsive with a HR in 40s. Paged MD, rapid called. Rapid response RN and  MD immediately to bedside. MD gave verbal order for 0.5 atropine, which was administered by RR RN. Pt's HR up into the 50s. Pt still lethargic and mostly unresponsive to voice. Some response to pain. RN accompanied pt to CT, along with RR RN. Narcan given x3 by RR RN (see Rapid note). Pt transferred to to [place] by RNs and RR RN.
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
## 2020-05-14 NOTE — Consult Note (Addendum)
Cardiology Consultation:   Patient ID: Tiffany [name] MRN: [id number]; DOB: 1960-10-22  Admit date: 05/11/2020 Date of Consult: 05/14/2020  PCP:  [name], [place]  Cardiologist:  Fransico Him, MD  :[id number]}    Patient Profile:   Tiffany [name] is a 60 y.o. female with a hx of COPD (on home O2 by older notes at least), She carries a diagnosis of diastolic dysfunction Echo 2018 normal EF She had cath with Dr Jeffie Pollock 06/19/17 with no CAD and only mildly elevated pulmonary pressures, HTN, HLD, DM, prior cocaine abuse, CKD (III), active schizoaffective disease, who is being seen today for the evaluation of recurrent bradycardia at the request of [place].  History of Present Illness:   [name] was admitted to [facility] 05/11/20 with N/V/D and unexplained weight loss of as much as 60lbs GI w/u underway Yesterday developed SB to high 30's and cardiology consulted Atropine was given with good HR response and felt to have been a vagal avenet, TTE obtained with preserved LVEF, no significant findings, and on their visit today, with no recurrent bradycardia, no further recommendations not felt to need pacing  Today apparently she had gotten some good news and beca,me really excited, after her visitor left she became somewhat lethargic and again bradycardic to 40's, was given atropine again. Did not become hypotensive and despite HR recovery remained fairly unresponsive and attending at bedside appreciated pin-point pupils.  She was given 2 doses of narcan her exam changing with more inconsistent.. CCM and EP called  Planned for head scan and 2h transfer  LABS     Past Medical History:  Diagnosis Date  . Agitation 11/22/2017  . Anoxic brain injury ([place]) 09/08/2016   C. Arrest due to respiratory failure and COPD exacerbation  . Anxiety   . Arthritis    "all over" (04/10/2016)  . Asthma 10/18/2010  . Binge eating disorder   . Cardiac arrest  ([place]) 09/08/2016   PEA  . Carotid artery stenosis    1-39% bilateral by dopplers 11/2016  . Chronic diastolic (congestive) heart failure ([place])   . Chronic kidney disease    "I see a kidney dr." (04/10/2016)  . Chronic pain syndrome 06/18/2012  . Chronic post-traumatic stress disorder (PTSD) 05/27/2018  . Chronic respiratory failure with hypoxia and hypercapnia (HCC) 06/22/2015   TRILOGY Vent >AVAPA-ES., Vt target 200-400, Max P 30 , PS max 20 , PS min 6-10 , E Max 6, E Min 4, Rate Auto AVAPS Rate 2 (titrate for pt comfort) , bleed O2 at 5l/m continuous flow .   [name] CKD (chronic kidney disease) stage 3, GFR 30-59 ml/min (HCC) 12/15/2016  . Closed displaced fracture of fifth metacarpal bone 03/21/2018  . Cocaine use disorder, severe, in sustained remission ([place]) 12/17/2015  . Complication of anesthesia    decreased bp, decreased heart rate  . COPD (chronic obstructive pulmonary disease) ([place]) 07/08/2014  . Depression   . Diabetic neuropathy ([place]) 04/24/2011  . Difficulty with speech 01/24/2018  . Disorder of nervous system   . Drug abuse ([place]) 11/21/2017  . Dyslipidemia 04/24/2011  . Elevated troponin 04/28/2012  . Emphysema   . Encephalopathy 11/21/2017  . Essential hypertension 03/22/2016  . Fibula fracture 07/10/2016  . Frequent falls 10/11/2017  . GERD (gastroesophageal reflux disease)   . Gout 04/11/2017  . Heart attack ([place]) 1980s  . History of blood transfusion 1994   "couldn't stop bleeding from my period"  . History of  drug abuse in remission ([place]) 11/28/2015   Quit in 2017  . Hyperlipidemia LDL goal <70   . Incontinence   . Manic depression ([place])   . Morbid obesity ([place]) 10/18/2010  . Obstructive sleep apnea 10/18/2010  . On home oxygen therapy    "6L; 24/7" (04/10/2016)  . OSA on CPAP    "wear mask sometimes" (04/10/2016)  . Paranoid ([place])    "sometimes; I'm on RX for it" (04/10/2016)  . Prolonged Q-T interval on ECG   . Rectal bleeding 12/31/2015  . Right carotid bruit 11/09/2016  .  Schizoaffective disorder, bipolar type ([place]) 04/05/2018  . Seasonal allergies   . Seborrheic keratoses 12/31/2013  . Seizures ([place])    "don't know what kind; last one was ~ 1 yr ago" (04/10/2016)  . Stroke [facility]) 1980s   denies residual on 04/10/2016  . Thrush 09/19/2013  . Type 2 diabetes mellitus ([place]) 10/18/2010    Past Surgical History:  Procedure Laterality Date  . CESAREAN SECTION  1997  . HERNIA REPAIR    . IR CHOLANGIOGRAM EXISTING TUBE  07/20/2016  . IR PERC CHOLECYSTOSTOMY  05/10/2016  . IR RADIOLOGIST EVAL & MGMT  06/08/2016  . IR RADIOLOGIST EVAL & MGMT  06/29/2016  . IR SINUS/FIST TUBE CHK-NON GI  07/12/2016  . RIGHT/LEFT HEART CATH AND CORONARY ANGIOGRAPHY N/A 06/19/2017   Procedure: RIGHT/LEFT HEART CATH AND CORONARY ANGIOGRAPHY;  Surgeon: Jolaine Artist, MD;  Location: [facility];  Service: Cardiovascular;  Laterality: N/A;  . TIBIA IM NAIL INSERTION Right 07/12/2016   Procedure: INTRAMEDULLARY (IM) NAIL RIGHT TIBIA;  Surgeon: Leandrew Koyanagi, MD;  Location: [facility];  Service: Orthopedics;  Laterality: Right;  . UMBILICAL HERNIA REPAIR  ~ 1963   "that's why I don't have a belly button"  . VAGINAL HYSTERECTOMY       Home Medications:  Prior to Admission medications   Medication Sig Start Date End Date Taking? Authorizing Provider  albuterol (PROVENTIL) (2.5 MG/3ML) 0.083% nebulizer solution Take 3 mLs (2.5 mg total) by nebulization every 6 (six) hours as needed for wheezing or shortness of breath. 03/17/20  Yes Collene Gobble, MD  albuterol (VENTOLIN HFA) 108 (90 Base) MCG/ACT inhaler Inhale 2 puffs into the lungs every 6 (six) hours as needed for wheezing or shortness of breath. 03/17/20  Yes Collene Gobble, MD  Asenapine Maleate (SAPHRIS) 10 MG SUBL Place 1 tablet (10 mg total) under the tongue 2 (two) times daily. 04/20/20  Yes Arfeen, Arlyce Harman, MD  atorvastatin (LIPITOR) 20 MG tablet Take 1 tablet (20 mg total) by mouth daily. 03/30/20  Yes [name], MD  Blood Pressure  Monitoring (BLOOD PRESSURE CUFF) MISC 1 Units by Does not apply route daily. 11/26/19  Yes Mayers, Cari S, PA-C  colchicine 0.6 MG tablet TAKE 1 TABLET(0.6 MG) BY MOUTH TWICE DAILY Patient taking differently: Take 0.6 mg by mouth 2 (two) times daily. 05/06/20  Yes [name], MD  diazepam (VALIUM) 10 MG tablet Take 1 tablet (10 mg total) by mouth 3 (three) times daily as needed for anxiety. 03/05/20 06/03/20 Yes Pucilowski, Olgierd A, MD  diclofenac Sodium (VOLTAREN) 1 % GEL Apply 4 g topically 4 (four) times daily. 03/30/20  Yes [name], MD  Elastic Bandages & Supports (MEDICAL COMPRESSION STOCKINGS) MISC 1 Units by Does not apply route daily. 11/26/19  Yes Mayers, Cari S, PA-C  escitalopram (LEXAPRO) 20 MG tablet Take 1 tablet (20 mg total) by mouth daily. 03/05/20 03/05/21  Yes Pucilowski, Olgierd A, MD  febuxostat (ULORIC) 40 MG tablet Take 1 tablet (40 mg total) by mouth daily. 03/30/20  Yes [name], MD  ferrous sulfate 325 (65 FE) MG tablet Take 1 tablet (325 mg total) by mouth daily with breakfast. 11/02/19  Yes Florencia Reasons, MD  Fluticasone-Umeclidin-Vilant (TRELEGY ELLIPTA) 100-62.5-25 MCG/INH AEPB Inhale 1 puff into the lungs daily. 03/17/20  Yes Collene Gobble, MD  folic acid (FOLVITE) 1 MG tablet Take 1 tablet (1 mg total) by mouth daily. 02/27/20  Yes Hongalgi, Lenis Dickinson, MD  gabapentin (NEURONTIN) 600 MG tablet Take 600 mg by mouth 2 (two) times daily.   Yes [provider]  lisdexamfetamine (VYVANSE) 20 MG capsule Take 1 capsule (20 mg total) by mouth daily with breakfast. 04/13/20 05/13/20 Yes Pucilowski, Olgierd A, MD  montelukast (SINGULAIR) 10 MG tablet Take 1 tablet (10 mg total) by mouth at bedtime. 11/28/19  Yes Collene Gobble, MD  Multiple Vitamin (MULTIVITAMIN WITH MINERALS) TABS tablet Take 1 tablet by mouth daily. 02/27/20  Yes Hongalgi, Lenis Dickinson, MD  omeprazole (PRILOSEC) 40 MG capsule Take 1 capsule (40 mg total) by mouth daily. 12/29/19  Yes [name], MD   oxybutynin (DITROPAN) 5 MG tablet Take 5 mg by mouth 2 (two) times daily. 03/18/20  Yes [provider]  potassium chloride SA (KLOR-CON) 20 MEQ tablet Take 40 mEq by mouth 2 (two) times daily.   Yes [provider]  prazosin (MINIPRESS) 2 MG capsule Take 1 capsule (2 mg total) by mouth at bedtime. 03/05/20 06/03/20 Yes Pucilowski, Olgierd A, MD  tiZANidine (ZANAFLEX) 4 MG tablet Take 4 mg by mouth 3 (three) times daily. 04/11/20  Yes [provider]  topiramate (TOPAMAX) 25 MG tablet Take 1 tablet (25 mg total) by mouth 2 (two) times daily. 04/13/20 07/12/20 Yes Pucilowski, Olgierd A, MD  amLODipine (NORVASC) 10 MG tablet Take 1 tablet (10 mg total) by mouth daily. Patient not taking: No sig reported 02/27/20   [place] Jansky, MD  chlorzoxazone (PARAFON FORTE) 250 MG tablet Take 1 tablet (250 mg total) by mouth 4 (four) times daily as needed for muscle spasms. Patient not taking: Reported on 05/11/2020 03/23/20   Hilts, Legrand Como, MD  cyclobenzaprine (FLEXERIL) 10 MG tablet Take 1 tablet (10 mg total) by mouth 2 (two) times daily as needed for muscle spasms. Patient not taking: Reported on 05/11/2020 03/10/20   [name], MD  feeding supplement (ENSURE ENLIVE / ENSURE PLUS) LIQD Take 237 mLs by mouth 2 (two) times daily between meals. Patient not taking: Reported on 05/11/2020 02/26/20   [place] Jansky, MD  gabapentin (NEURONTIN) 300 MG capsule Take 1 capsule (300 mg total) by mouth at bedtime. Patient not taking: No sig reported 04/20/20 04/20/21  Arfeen, Arlyce Harman, MD  hydrALAZINE (APRESOLINE) 50 MG tablet Take 1 tablet (50 mg total) by mouth 2 (two) times daily. Patient not taking: Reported on 05/11/2020 02/26/20   [place] Jansky, MD  thiamine 100 MG tablet Take 1 tablet (100 mg total) by mouth daily. Patient not taking: No sig reported 02/27/20   [place] Jansky, MD  traZODone (DESYREL) 100 MG tablet Take 1 tablet (100 mg total) by mouth at bedtime. Patient not taking: No  sig reported 02/26/20   [place] Jansky, MD  budesonide-formoterol [facility]) 80-4.5 MCG/ACT inhaler Inhale 2 puffs into the lungs 2 (two) times daily. Patient not taking: Reported on 09/29/2019 11/15/18 09/29/19  Maximiano Coss, NP    Inpatient  Medications: Scheduled Meds: . naloxone      . naloxone      . diclofenac Sodium  4 g Topical TID AC & HS  . fluticasone furoate-vilanterol  1 puff Inhalation Daily   And  . umeclidinium bromide  1 puff Inhalation Daily  . folic acid  1 mg Oral Daily  . heparin  5,000 Units Subcutaneous Q8H  . insulin aspart  0-9 Units Subcutaneous Q4H  . pantoprazole  40 mg Oral Daily  . silver sulfADIAZINE   Topical Daily  . sodium bicarbonate  1,300 mg Oral TID   Continuous Infusions: . dextrose 5 % with KCl/Additives Pediatric custom IV fluid 100 mL/hr at 05/14/20 0931   PRN Meds: albuterol, atropine, ondansetron **OR** ondansetron (ZOFRAN) IV  Allergies:    Allergies  Allergen Reactions  . Hydrocodone Shortness Of Breath  . Hydrocodone-Acetaminophen Shortness Of Breath  . Hydroxyzine Anaphylaxis and Shortness Of Breath  . Latuda [Lurasidone Hcl] Anaphylaxis  . Lurasidone Anaphylaxis  . Magnesium-Containing Compounds Anaphylaxis  . Prednisone Anaphylaxis, Swelling and Other (See Comments)    Tongue swelling  . Tramadol Anaphylaxis and Swelling  . Codeine Nausea And Vomiting  . Other Rash  . Sulfa Antibiotics Itching  . Tape Rash    Social History:   Social History   Socioeconomic History  . Marital status: Widowed    Spouse name: Not on file  . Number of children: 3  . Years of education: Not on file  . Highest education level: Not on file  Occupational History  . Occupation: disabled    Comment: factory production  Tobacco Use  . Smoking status: Former Smoker    Packs/day: 1.50    Years: 38.00    Pack years: 57.00    Types: Cigarettes    Start date: 03/13/1977    Quit date: 04/10/2016    Years since quitting: 4.0  .  Smokeless tobacco: Never Used  Vaping Use  . Vaping Use: Never used  Substance and Sexual Activity  . Alcohol use: No    Alcohol/week: 0.0 standard drinks  . Drug use: No    Types: Cocaine    Comment: 04/10/2016 "last used cocaine back in November 2017"  . Sexual activity: Not Currently    Birth control/protection: Surgical  Other Topics Concern  . Not on file  Social History Narrative   Has 1 son, [name]   Lives with son and his boyfriend   Her house has ramps and handrails should she ever needs them.    Her mother lives down the street from her and is a good support person in addition to her son.   She drives herself, has private transportation.    Cocaine free since 02/24/16, smoke free since 04/10/16   Social Determinants of Health   Financial Resource Strain: Not on file  Food Insecurity: Not on file  Transportation Needs: Not on file  Physical Activity: Not on file  Stress: Not on file  Social Connections: Not on file  Intimate Partner Violence: Not on file    Family History:   Family History  Problem Relation Age of Onset  . Cancer Father        prostate  . Cancer Mother        lung  . Depression Mother   . Depression Sister   . Anxiety disorder Sister   . Schizophrenia Sister   . Bipolar disorder Sister   . Depression Sister   . Depression Brother   . Heart  failure Other        cousin     ROS:  Please see the history of present illness.  All other ROS reviewed and negative.     Physical Exam/Data:   Vitals:   05/13/20 2340 05/14/20 0456 05/14/20 0749 05/14/20 0920  BP: (!) 99/56 110/74  117/74  Pulse: (!) 56 (!) 50  63  Resp: 20 19  16   Temp: 98.3 F (36.8 C) 98 F (36.7 C)    TempSrc: Oral Oral    SpO2: 98% 97% 98% 97%  Weight:      Height:        Intake/Output Summary (Last 24 hours) at 05/14/2020 1045 Last data filed at 05/14/2020 0830 Gross per 24 hour  Intake --  Output 1375 ml  Net -1375 ml   Last 3 Weights 05/11/2020 05/11/2020  02/26/2020  Weight (lbs) 120 lb 5.9 oz 125 lb 150 lb 9.2 oz  Weight (kg) 54.6 kg 56.7 kg 68.3 kg  Some encounter information is confidential and restricted. Go to Review Flowsheets activity to see all data.     Body mass index is 21.32 kg/m.  General:  Well nourished, though clearly a much thinner version of her in comparison to her chart photo HEENT:pin-point pupils Lymph: no adenopathy Neck: no JVD Endocrine:  No thryomegaly Vascular: No carotid bruits Cardiac:  RRR; no murmurs, gallops or rubs Lungs:  CTA b/l, no wheezing, rhonchi or rales  Abd: soft, nontender Ext: no edema Musculoskeletal:  No deformities Skin: warm and dry  Neuro:  inconsistent neuro exam Psych:  Normal affect   EKG:  The EKG was personally reviewed and demonstrates:   SR 76bpm, normal intervals, nonspecific ST/T changes SR 70, unchanged Yesterday SB 38bpm, no ST/T changes, PR 164ms   Telemetry:  Telemetry was personally reviewed and demonstrates:   SR largely 50's-60's, today during the time of the events, a rise in HR 80's-90's with a somewhat gradual decrease back towards her baseline 50's then transiently mid 40's Post atropine 50's-60's  Relevant CV Studies:  05/13/20: TTE IMPRESSIONS  1. Left ventricular ejection fraction, by estimation, is 55 to 60%. The  left ventricle has normal function. The left ventricle has no regional  wall motion abnormalities. Left ventricular diastolic parameters are  consistent with Grade I diastolic  dysfunction (impaired relaxation).  2. Right ventricular systolic function is normal. The right ventricular  size is normal. There is normal pulmonary artery systolic pressure. The  estimated right ventricular systolic pressure is 82.9 mmHg.  3. Left atrial size was mildly dilated.  4. The mitral valve is grossly normal. Trivial mitral valve  regurgitation. No evidence of mitral stenosis.  5. The aortic valve is tricuspid. Aortic valve regurgitation is not   visualized. No aortic stenosis is present.  6. The inferior vena cava is normal in size with greater than 50%  respiratory variability, suggesting right atrial pressure of 3 mmHg.   Laboratory Data:  High Sensitivity Troponin:   Recent Labs  Lab 05/13/20 1230 05/13/20 1512  TROPONINIHS 14 14     Chemistry Recent Labs  Lab 05/12/20 0709 05/13/20 0037 05/14/20 0023  NA 139 136 137  K 3.0* 3.5 3.6  CL 116* 115* 114*  CO2 17* 17* 18*  GLUCOSE 77 91 109*  BUN 23 19 13   CREATININE 2.48* 2.06* 1.73*  CALCIUM 7.7* 8.1* 8.2*  GFRNONAA 22* 27* 33*  ANIONGAP 6 4* 5    Recent Labs  Lab 05/12/20 0709 05/13/20 0037  05/14/20 0023  PROT 4.5* 3.8* 3.8*  ALBUMIN 2.4* 2.1* 2.1*  AST 16 17 18   ALT 9 9 10   ALKPHOS 80 75 73  BILITOT 0.6 0.4 0.4   Hematology Recent Labs  Lab 05/11/20 1230 05/13/20 1230  WBC 7.0 4.0  RBC 3.87 3.30*  HGB 10.7* 9.3*  HCT 34.6* 28.8*  MCV 89.4 87.3  MCH 27.6 28.2  MCHC 30.9 32.3  RDW 16.9* 17.1*  PLT 371 348   BNPNo results for input(s): BNP, PROBNP in the last 168 hours.  DDimer No results for input(s): DDIMER in the last 168 hours.   Radiology/Studies:  CT Abdomen Pelvis Wo Contrast Result Date: 05/11/2020 CLINICAL DATA:  60 year old female with abdominal pain. EXAM: CT ABDOMEN AND PELVIS WITHOUT CONTRAST TECHNIQUE: Multidetector CT imaging of the abdomen and pelvis was performed following the standard protocol without IV contrast. COMPARISON:  CT abdomen pelvis dated 10/30/2019 abdominal radiograph dated 02/21/2020. FINDINGS: Evaluation of this exam is limited in the absence of intravenous contrast. Lower chest: The visualized lung bases are clear. No intra-abdominal free air or free fluid. Hepatobiliary: The liver is unremarkable. No intrahepatic biliary dilatation. Multiple stones noted in the gallbladder. No pericholecystic fluid or evidence of acute cholecystitis by CT. Pancreas: The pancreas is suboptimally visualized but grossly  unremarkable. No active inflammatory changes. Spleen: Normal in size without focal abnormality. Adrenals/Urinary Tract: The adrenal glands unremarkable. The kidneys, and the visualized ureters appear unremarkable. There is apparent focal area thickening of the left posterior bladder wall measuring approximately 5 mm in thickness (66/3 and 57/6). This area is suboptimally evaluated by CT. A urothelial lesion is not excluded. Further evaluation with cystoscopy is recommended. Stomach/Bowel: There is sigmoid diverticulosis and scattered colonic diverticula without active inflammatory changes. There is no bowel obstruction or active inflammation. The appendix is normal. Vascular/Lymphatic: Mild aortoiliac atherosclerotic disease. The IVC is unremarkable. No portal venous gas. There is no adenopathy. Reproductive: Hysterectomy. There is an 8 cm cystic lesion arising from the left adnexa. Because this lesion is not adequately characterized, prompt [name] is recommended for further evaluation. Note: This recommendation does not apply to premenarchal patients and to those with increased risk (genetic, family history, elevated tumor markers or other high-risk factors) of ovarian cancer. Reference: JACR 2020 Feb; 17(2):248-254 Other: Mild diffuse subcutaneous stranding. No fluid collection. Musculoskeletal: Osteopenia with degenerative changes of the spine. Grade 1 L3-L4 anterolisthesis. Multilevel facet arthropathy. No acute osseous pathology. IMPRESSION: 1. No acute intra-abdominal or pelvic pathology. 2. Colonic diverticulosis. No bowel obstruction. Normal appendix. 3. Cholelithiasis. 4. A 8 cm left adnexal cystic lesion. Further evaluation with ultrasound is recommended. 5. Aortic Atherosclerosis (ICD10-I70.0). Electronically Signed   By: Anner Crete M.D.   On: 05/11/2020 15:58     DG UGI W SINGLE CM (SOL OR THIN BA) Result Date: 05/13/2020 CLINICAL DATA:  Nausea and vomiting. EXAM: UPPER GI SERIES WITH KUB  TECHNIQUE: After obtaining a scout radiograph a routine upper GI series was performed using thin and high density barium. FLUOROSCOPY TIME:  Fluoroscopy Time:  36 seconds Radiation Exposure Index (if provided by the fluoroscopic device): 46.10 mGy Number of Acquired Spot Images: 2 COMPARISON:  CT abdomen/pelvis 05/11/2020. FINDINGS: The examination is somewhat limited due to limited patient positioning. A scout radiograph of the abdomen demonstrates a nonobstructive bowel gas pattern. A round metallic foreign body projected over the left abdomen. This reflected a coin overlying the patient, which was subsequently removed. Slightly patulous esophagus. Prominent intermittent esophageal dysmotility with tertiary contractions.  There is no appreciable fixed high-grade esophageal stricture with the patient drinking liquid barium. However, a swallowed 13 mm barium tablet did not pass beyond the level of the distal esophagus despite a prolonged period of observation, and this may reflect a mild smooth stricture at this site. Small hiatal hernia. Moderate volume gastroesophageal reflux observed to the level of the mid esophagus. Unremarkable appearance of the stomach, duodenal bulb and duodenal sweep. No definite evidence of ulceration, fold thickening or mass. IMPRESSION: Somewhat limited examination due to limited patient positioning. Mildly patulous esophagus. Prominent intermittent esophageal dysmotility with tertiary contractions. A swallowed 13 mm barium tablet did not pass beyond the level of the distal esophagus, despite a prolonged period of observation, and this may reflect a mild smooth stricture at this site. Endoscopy should be considered for further evaluation. Moderate-volume gastroesophageal reflux observed to the level of the mid esophagus. Small hiatal hernia. Electronically Signed   By: Kellie Simmering DO   On: 05/13/2020 09:05      Assessment and Plan:   1. SB     No evidence conduction system  disease, no AVblock is noted     I suspect a triggering event  Her lethargy and normalized HR after atropine For the most part it appears that for the most part she has declined most meds including antipsychotics her and it seems outpatient   In review of her meds with [facility], no noted meds that are potential for brady and not today  I do not appreciate any reports or mentions of hx of syncope/. The patient currently she does not respond to any questions not following any commands inconsistent exam with no real gaurding though pulls away attempts at IV insertion and asks the CCM APP to stop moving her foot when she was palpating her foot)  CCM at bedside Plans for perhaps head imaging Not getting much in the way of meds here  Schizophrenia is probably confusing her exam She apparently had a visitor just prior to the events, got news that her daughter had a baby and th ept became somewhat hyperexcited clapping, very animated.  Shortly after the visitor left the events occurred ?? Some discussion or concerns if possibly provided the pt with something with at least hx of cocaine use/abuse, though not felt to be an ongoing issue/in recovery  [name] acidotic, eval into this has been ongoing, suspect GI in d/w attending   No pacing at this time, Dr. Curt Bears Garnell Begeman see later today   2. Presenting N/V/D     swallow noted no passage of contrast beyond the distal esophagus     W/u ongoing, pt has apparently declined a number of exams          Risk Assessment/Risk Scores:  { For questions or updates, please contact [facility] Please consult [email or web address] for contact info under    Signed, Baldwin [place], PA-C  05/14/2020 10:45 AM  I have seen and examined this patient with Tommye Standard.  Agree with above, note added to reflect my findings.  On exam, RRR, no murmurs.   Patient presented to hospital with nausea vomiting and unexplained weight loss.  GI work-up is currently underway.   Yesterday, she developed sinus bradycardia into the 30s.  She received atropine.  Today, she had another episode of bradycardia, this time into the 40s.  She became quite excited when she got good news with heart rates that were in the 80s to 90s.  She began to slow  down as her excitement waned with heart rates in the 40s.  She became somewhat unresponsive at that time.  In review, her blood pressure was in the 161W systolic.  She also has had sinus bradycardia into the 40s during her hospitalization without symptoms.  At this point, I do not feel that her sinus bradycardia is contributing to her lethargy.  Would work-up other causes.  Vinnie Gombert M. Juliza Machnik MD 05/14/2020 3:09 PM

## 2020-05-14 NOTE — Progress Notes (Signed)
Subjective:  Denies SSCP, palpitations or Dyspnea Has refused to have EGD to Rx esophageal stricture   Objective:  Vitals:   05/13/20 2340 05/14/20 0456 05/14/20 0749 05/14/20 0920  BP: (!) 99/56 110/74  117/74  Pulse: (!) 56 (!) 50  63  Resp: 20 19  16   Temp: 98.3 F (36.8 C) 98 F (36.7 C)    TempSrc: Oral Oral    SpO2: 98% 97% 98% 97%  Weight:      Height:        Intake/Output from previous day:  Intake/Output Summary (Last 24 hours) at 05/14/2020 1740 Last data filed at 05/13/2020 2015 Gross per 24 hour  Intake --  Output 750 ml  Net -750 ml    Physical Exam: Affect appropriate Obese black female  HEENT: normal Neck supple with no adenopathy JVP normal no bruits no thyromegaly Lungs clear with no wheezing and good diaphragmatic motion Heart:  S1/S2 no murmur, no rub, gallop or click PMI normal Abdomen: benighn, BS positve, no tenderness, no AAA no bruit.  No HSM or HJR Distal pulses intact with no bruits No edema Neuro non-focal Skin warm and dry No muscular weakness   Lab Results: Basic Metabolic Panel: Recent Labs    05/11/20 2018 05/12/20 0709 05/13/20 0037 05/14/20 0023  NA  --    < > 136 137  K  --    < > 3.5 3.6  CL  --    < > 115* 114*  CO2  --    < > 17* 18*  GLUCOSE  --    < > 91 109*  BUN  --    < > 19 13  CREATININE  --    < > 2.06* 1.73*  CALCIUM  --    < > 8.1* 8.2*  MG  --    < > 2.1 1.7  PHOS 3.8  --   --   --    < > = values in this interval not displayed.   Liver Function Tests: Recent Labs    05/13/20 0037 05/14/20 0023  AST 17 18  ALT 9 10  ALKPHOS 75 73  BILITOT 0.4 0.4  PROT 3.8* 3.8*  ALBUMIN 2.1* 2.1*   Recent Labs    05/11/20 1230  LIPASE 25   CBC: Recent Labs    05/11/20 1230 05/13/20 1230  WBC 7.0 4.0  NEUTROABS  --  2.2  HGB 10.7* 9.3*  HCT 34.6* 28.8*  MCV 89.4 87.3  PLT 371 348   Cardiac Enzymes: No results for input(s): CKTOTAL, CKMB, CKMBINDEX, TROPONINI in the last 72  hours. BNP: Invalid input(s): POCBNP D-Dimer: No results for input(s): DDIMER in the last 72 hours. Hemoglobin A1C: Recent Labs    05/11/20 2018  HGBA1C 5.3   Fasting Lipid Panel: No results for input(s): CHOL, HDL, LDLCALC, TRIG, CHOLHDL, LDLDIRECT in the last 72 hours. Thyroid Function Tests: Recent Labs    05/14/20 0023  TSH 0.832   Anemia Panel: No results for input(s): VITAMINB12, FOLATE, FERRITIN, TIBC, IRON, RETICCTPCT in the last 72 hours.  Imaging: DG UGI W SINGLE CM (SOL OR THIN BA)  Result Date: 05/13/2020 CLINICAL DATA:  Nausea and vomiting. EXAM: UPPER GI SERIES WITH KUB TECHNIQUE: After obtaining a scout radiograph a routine upper GI series was performed using thin and high density barium. FLUOROSCOPY TIME:  Fluoroscopy Time:  36 seconds Radiation Exposure Index (if provided by the fluoroscopic device): 46.10 mGy Number of Acquired Spot  Images: 2 COMPARISON:  CT abdomen/pelvis 05/11/2020. FINDINGS: The examination is somewhat limited due to limited patient positioning. A scout radiograph of the abdomen demonstrates a nonobstructive bowel gas pattern. A round metallic foreign body projected over the left abdomen. This reflected a coin overlying the patient, which was subsequently removed. Slightly patulous esophagus. Prominent intermittent esophageal dysmotility with tertiary contractions. There is no appreciable fixed high-grade esophageal stricture with the patient drinking liquid barium. However, a swallowed 13 mm barium tablet did not pass beyond the level of the distal esophagus despite a prolonged period of observation, and this may reflect a mild smooth stricture at this site. Small hiatal hernia. Moderate volume gastroesophageal reflux observed to the level of the mid esophagus. Unremarkable appearance of the stomach, duodenal bulb and duodenal sweep. No definite evidence of ulceration, fold thickening or mass. IMPRESSION: Somewhat limited examination due to limited  patient positioning. Mildly patulous esophagus. Prominent intermittent esophageal dysmotility with tertiary contractions. A swallowed 13 mm barium tablet did not pass beyond the level of the distal esophagus, despite a prolonged period of observation, and this may reflect a mild smooth stricture at this site. Endoscopy should be considered for further evaluation. Moderate-volume gastroesophageal reflux observed to the level of the mid esophagus. Small hiatal hernia. Electronically Signed   By: Kellie Simmering DO   On: 05/13/2020 09:05   ECHOCARDIOGRAM COMPLETE  Result Date: 05/13/2020    ECHOCARDIOGRAM REPORT   Patient Name:   Tiffany [name] [facility] Date of Exam: 05/13/2020 Medical Rec #:  [id number]       Height:       63.0 in Accession #:    [id number]      Weight:       120.4 lb Date of Birth:  09-Jan-1961       BSA:          1.558 m Patient Age:    60 years        BP:           94/58 mmHg Patient Gender: F               HR:           48 bpm. Exam Location:  Inpatient Procedure: 2D Echo STAT ECHO Indications:     cardiac arrest  History:         Patient has prior history of Echocardiogram examinations, most                  recent 10/30/2016. COPD; Risk Factors:Diabetes and                  Dyslipidemia.  Sonographer:     Johny Chess Referring Phys:  [id number] [place] Diagnosing Phys: Eleonore Chiquito MD IMPRESSIONS  1. Left ventricular ejection fraction, by estimation, is 55 to 60%. The left ventricle has normal function. The left ventricle has no regional wall motion abnormalities. Left ventricular diastolic parameters are consistent with Grade I diastolic dysfunction (impaired relaxation).  2. Right ventricular systolic function is normal. The right ventricular size is normal. There is normal pulmonary artery systolic pressure. The estimated right ventricular systolic pressure is 38.4 mmHg.  3. Left atrial size was mildly dilated.  4. The mitral valve is grossly normal. Trivial mitral valve regurgitation. No  evidence of mitral stenosis.  5. The aortic valve is tricuspid. Aortic valve regurgitation is not visualized. No aortic stenosis is present.  6. The inferior vena cava is normal in size with greater than 50%  respiratory variability, suggesting right atrial pressure of 3 mmHg. FINDINGS  Left Ventricle: Left ventricular ejection fraction, by estimation, is 55 to 60%. The left ventricle has normal function. The left ventricle has no regional wall motion abnormalities. The left ventricular internal cavity size was normal in size. There is  no left ventricular hypertrophy. Left ventricular diastolic parameters are consistent with Grade I diastolic dysfunction (impaired relaxation). Right Ventricle: The right ventricular size is normal. No increase in right ventricular wall thickness. Right ventricular systolic function is normal. There is normal pulmonary artery systolic pressure. The tricuspid regurgitant velocity is 2.36 m/s, and  with an assumed right atrial pressure of 3 mmHg, the estimated right ventricular systolic pressure is 94.7 mmHg. Left Atrium: Left atrial size was mildly dilated. Right Atrium: Right atrial size was normal in size. Pericardium: Trivial pericardial effusion is present. Mitral Valve: The mitral valve is grossly normal. Trivial mitral valve regurgitation. No evidence of mitral valve stenosis. Tricuspid Valve: The tricuspid valve is grossly normal. Tricuspid valve regurgitation is trivial. No evidence of tricuspid stenosis. Aortic Valve: The aortic valve is tricuspid. Aortic valve regurgitation is not visualized. No aortic stenosis is present. Pulmonic Valve: The pulmonic valve was grossly normal. Pulmonic valve regurgitation is not visualized. No evidence of pulmonic stenosis. Aorta: The aortic root is normal in size and structure. Venous: The right upper pulmonary vein is normal. The inferior vena cava is normal in size with greater than 50% respiratory variability, suggesting right atrial  pressure of 3 mmHg. IAS/Shunts: The atrial septum is grossly normal.  LEFT VENTRICLE PLAX 2D LVIDd:         4.70 cm  Diastology LVIDs:         3.20 cm  LV e' medial:    4.24 cm/s LV PW:         1.10 cm  LV E/e' medial:  14.3 LV IVS:        0.90 cm  LV e' lateral:   5.55 cm/s LVOT diam:     1.90 cm  LV E/e' lateral: 11.0 LV SV:         85 LV SV Index:   54 LVOT Area:     2.84 cm  RIGHT VENTRICLE            IVC RV S prime:     8.05 cm/s  IVC diam: 1.30 cm TAPSE (M-mode): 2.1 cm LEFT ATRIUM             Index       RIGHT ATRIUM           Index LA diam:        3.90 cm 2.50 cm/m  RA Area:     10.50 cm LA Vol (A2C):   48.5 ml 31.12 ml/m RA Volume:   22.60 ml  14.50 ml/m LA Vol (A4C):   59.5 ml 38.18 ml/m LA Biplane Vol: 54.5 ml 34.97 ml/m  AORTIC VALVE LVOT Vmax:   128.00 cm/s LVOT Vmean:  77.200 cm/s LVOT VTI:    0.299 m  AORTA Ao Root diam: 2.80 cm MITRAL VALVE               TRICUSPID VALVE MV Area (PHT): 2.44 cm    TR Peak grad:   22.3 mmHg MV Decel Time: 311 msec    TR Vmax:        236.00 cm/s MV E velocity: 60.80 cm/s MV A velocity: 73.30 cm/s  SHUNTS MV E/A ratio:  0.83  Systemic VTI:  0.30 m                            Systemic Diam: 1.90 cm Eleonore Chiquito MD Electronically signed by Eleonore Chiquito MD Signature Date/Time: 05/13/2020/2:27:11 PM    Final (Updated)     Cardiac Studies:  ECG: Sinus bradycardia no AV block   Telemetry:SB rates 45-55 bpm  Echo: EF 55-60%  No significant valve disease   Medications:   . asenapine  10 mg Sublingual BID  . atorvastatin  20 mg Oral Daily  . diclofenac Sodium  4 g Topical TID AC & HS  . escitalopram  20 mg Oral Daily  . feeding supplement  237 mL Oral BID BM  . fluticasone furoate-vilanterol  1 puff Inhalation Daily   And  . umeclidinium bromide  1 puff Inhalation Daily  . folic acid  1 mg Oral Daily  . heparin  5,000 Units Subcutaneous Q8H  . insulin aspart  0-9 Units Subcutaneous Q4H  . lisdexamfetamine  20 mg Oral Q breakfast  . montelukast   10 mg Oral QHS  . multivitamin with minerals  1 tablet Oral Daily  . oxybutynin  5 mg Oral BID  . pantoprazole  40 mg Oral Daily  . prazosin  2 mg Oral QHS  . silver sulfADIAZINE   Topical Daily  . sodium bicarbonate  1,300 mg Oral TID  . topiramate  25 mg Oral BID     . dextrose 5 % with KCl/Additives Pediatric custom IV fluid 100 mL/hr at 05/14/20 0931    Assessment/Plan:   1. Bradycardia:  With vagal tone ? Due to GI illness nausea. K/Cr normal not on any AV nodal drugs acidosis being Rx No AV block with stable hemodynamics Responsive to atropine yesterday TSH normal  Continue to monitor no indication for PPM at this time  2. GI:  Encouraged her to reconsider EGD to improve symptoms  Jenkins Rouge 05/14/2020, 9:56 AM

## 2020-05-14 NOTE — Telephone Encounter (Signed)
Patient called into the office. Connected with nurse and she advised that patient needs to contact [facility] regarding [facility] health. [facility] agent advised patient to contact [facility].

## 2020-05-14 NOTE — Significant Event (Addendum)
Rapid Response Event Note   Reason for Call :  Bradycardia, unresponsive Per primary RN, pt was just visited by her sister. She had received the news that she had another grandchild and was very happy. Pt was alert, and telling her RN of the news. Shortly after, pt sister left and RN went in to find pt bradycardic and unresponsive.   Initial Focused Assessment:  Pt lying in bed. Minimal grimace to noxious stimuli, not consistent. Pupils are 56mm, non reactive. Pt is noted to move all extremities and movement is equal. Lung sounds are clear. Breathing is regular, unlabored. No adventitious heart sounds. Peripheral pulses are 1-2+ in all extremities. Skin is warm, dry.  When pt does awaken, her speech is slurred. She endorses her speech to her being tired and does not further cooperate with evaluation.  VS: BP 113/80, HR 44, RR 17, SpO2 98% on room air CBG: 102  Interventions:  -Atropine 1mg  IV per MD -ABG  7.34/ 33.2/ 92.6/ 17.7 -CT head  -Narcan x3  Plan of Care:  Transfer to ICU  Event Summary:  MD Notified: Dr. Verlon Au Call Time: 0931 Arrival Time: 1010 End Time: [place], RN

## 2020-05-14 NOTE — Progress Notes (Addendum)
Called emergently to bedside Patient nonresponsive-pinpoint pupils-Atropine given 0.5 by RR RN Given 1 mg more at bedside Narcan given NO offending meds noted given [Valium last pm ~ 2100]  Patient received happy news from sister earlier today--was emotional about new grandchild birth--promptly after this ~ 43 min later became lethargic unresponsive and HR in 40's [similar to yesterday]   DDX Severe Vasovagal issues vs  Surreptitious ingestion vs Metabolic component Vs Acute Stuttering CVA  P Stat CT head now EP input greatly appreciated CCM input appreciated--Will be considering move to ICU  D/w Son Kiriana, [place]   [phone]  No HCPOA on file 3 children-Anthony is middle child--is estranged from her---Spoke with her ~ 1 month prior He will alert other family members--it doesn't;t seem patient is in close contact with any family  Verneita Griffes, MD [facility] 10:51 AM

## 2020-05-14 NOTE — Procedures (Signed)
Cortrak  Person Inserting Tube:  Zakary Kimura, Bethann Berkshire, RD Tube Type:  Cortrak - 43 inches Tube Location:  Right nare Initial Placement:  Stomach Secured by: Bridle Technique Used to Measure Tube Placement:  Documented cm marking at nare/ corner of mouth Cortrak Secured At:  77 cm   X-ray is required, abdominal x-ray has been ordered by the Cortrak team. Please confirm tube placement before using the Cortrak tube.   If the tube becomes dislodged please keep the tube and contact the Cortrak team at [email or web address] (password [email or web address]) for replacement.  If after hours and replacement cannot be delayed, place a NG tube and confirm placement with an abdominal x-ray.    Mariana Single RD, LDN Clinical Nutrition Pager listed in [place]

## 2020-05-14 NOTE — Progress Notes (Signed)
RT note- states that she throws up after taking.

## 2020-05-14 NOTE — Progress Notes (Addendum)
Nutrition Follow-up  DOCUMENTATION CODES:   Severe malnutrition in context of social or environmental circumstances  INTERVENTION:   Recommend placing Cortrak given malnutrition, AMS and dysphagia.   Tube Feeding via Cortrak:  Vital 1.5 at 50 ml/hr Pro-Source TF 45 mL BID Begin TF at 20 ml/hr; titrate by 10 mL q 8 hours until goal rate of 50 ml/hr Provides 103 g of protein, 1880 kcals and 912 mL of free water  Thiamine 100 mg per tube x 7 days  Pt at risk for multiple vitamin deficiencies; recommend checking Vitamin D, Vitamin A, Vitamin C, Vitamin J62, Folic ACid  NUTRITION DIAGNOSIS:   Severe Malnutrition related to social / environmental circumstances as evidenced by moderate fat depletion,moderate muscle depletion.  Being addressed via TF   GOAL:   Patient will meet greater than or equal to 90% of their needs  Progressing  MONITOR:   PO intake,Supplement acceptance,Labs,Weight trends  REASON FOR ASSESSMENT:   Malnutrition Screening Tool    ASSESSMENT:   60 yo female admitted with AKI secondary to diarrhea and N/V, dysphagia. PMH includes COPD exacerbation, respiratory failure, CHF, CKD, CVA, schizoaffective disorder, paranoid schizophrenia, DM, folate deficient  Pt become somnolent and bradycardic this AM; transfer to ICU for closer monitoring  NPO currently  Pt with barium esophagram and upper GI yesterday with prominent esophageal dysmotility resulting in no movement beyond distal esophagus. Pt may have stricture per MD note.   Pt currently on narcan drip Obtunded during physical exam today. However, during Cortrak placement pt woke up and vocalizing, asking for her mom. Allowed Cortrak placement, fell asleep after completion. Of note, pt did report during Cortrak that she did not want nutrition because she "did not want to get fat"  Mother at bedside. Mother indicating that pt does live alone and mother assists pt with grocery shopping. Pt is mobile but  mobility is very limited. Pt does not currently have any home health aids in the home per Mom. Mother indicates that pt does not have any contact with her son, in fact, the son does not even know where the patient lives. Mother also indicates that pt expressed last night that she wished she was dead, she just wants to die. Relayed this information to [employer].   Labs: CBGs 68-144 Meds: MVI with Minerals, folic acid, sodium bicarb  NUTRITION - FOCUSED PHYSICAL EXAM:  Flowsheet Row Most Recent Value  Orbital Region Moderate depletion  Upper Arm Region Moderate depletion  Thoracic and Lumbar Region Severe depletion  Buccal Region Severe depletion  Temple Region Severe depletion  Clavicle Bone Region Severe depletion  Clavicle and Acromion Bone Region Severe depletion  Scapular Bone Region Severe depletion  Dorsal Hand Moderate depletion  Patellar Region Severe depletion  Anterior Thigh Region Severe depletion  Posterior Calf Region Severe depletion  Edema (RD Assessment) None       Diet Order:   Diet Order            Diet NPO time specified  Diet effective now                 EDUCATION NEEDS:   Not appropriate for education at this time  Skin:  Skin Assessment: Reviewed RN Assessment  Last BM:  4/26  Height:   Ht Readings from Last 1 Encounters:  05/11/20 5\' 3"  (1.6 m)    Weight:   Wt Readings from Last 1 Encounters:  05/11/20 54.6 kg    BMI:  Body mass index is  21.32 kg/m.  Estimated Nutritional Needs:   Kcal:  1800-2000 kcals  Protein:  85-100 g  Fluid:  >/= 1.8 L    Kerman Passey MS, RDN, LDN, CNSC Registered Dietitian III Clinical Nutrition RD Pager and On-Call Pager Number Located in [place]

## 2020-05-14 NOTE — Progress Notes (Signed)
PT Cancellation Note  Patient Details Name: Tiffany [name] MRN: [id number] DOB: 1960-05-27   Cancelled Treatment:    Reason Eval/Treat Not Completed: Medical issues which prohibited therapy. Pt with unresponsive episode and with pending transfer to [place].    Shary Decamp [facility] 05/14/2020, 11:35 AM [place] Pager [phone] Office [phone]

## 2020-05-14 NOTE — Progress Notes (Signed)
Patient placed on suicide precautions. Patient had one personal belongings bag with clothing, shoes and a belt that were sent down to security to be locked up.  See ghost chart for locker number in [place] office in ED

## 2020-05-14 NOTE — Progress Notes (Signed)
Patient unresponsive and obtunded currently. Patient found with large brown bag that is triple locked. Keys were with patient and taken from patient.  Bag searched with [place], RN and found various weapons and pills along with belongings. Security and [facility] both aware and now here on unit. Bag sent down with security at this time and locked up. See ghost chart for locker number in [place] office in ED.

## 2020-05-14 NOTE — Consult Note (Signed)
NAME:  Tiffany [name], MRN:  [id number], DOB:  April 20, 1960, LOS: 3 ADMISSION DATE:  05/11/2020, CONSULTATION DATE:  05/14/2020 REFERRING MD:  Dr. Verlon Au, CHIEF COMPLAINT:  AMS with intermittent bradycardia    History of Present Illness:  Tiffany [name] is a 60 y.o. female with an extensive PMH significant for but not limited to type 2 diabetes, prior stroke, prior seizures, schizoaffective disorder, OSA on CPAP, HLD, GERD, Drug abuse, COPD, CKD stage III, and anxiety who presented to ED for complaints of recurrent nausea, vomiting, and ABD pain. Per chart review symptoms started 3 weeks prior to admission.   She was admitted under hospitalist services for treatment of AKI, dysphagia, and multiple metabolic derangements.  Midmorning of 4/29 patient was seen with second episode of bradycardia with worsening altered mental status.  Patient experienced similar presentation day prior which improved with IV atropine push.  Atropine again administered today with limited improvement and altered mental status. Now status post 2 doses of IV Narcan with minimal response.  Responsiveness waxes and wanes.  She will respond minimally to sternal rub but awakens to movement of left foot.  Pertinent  Medical History  Significant for but not limited to: Type 2 diabetes, prior stroke, prior seizures, schizoaffective disorder, OSA on CPAP, HLD, GERD, Drug abuse, COPD, CKD stage III, and anxiety  Significant Hospital Events: Including procedures, antibiotic start and stop dates in addition to other pertinent events   . 4/26 admitted for nausea vomiting abdominal pain . 4/28 episode of bradycardia with arterial status improved with Atropine  . 4/29 repeat episode of bradycardia repeat Atropine but mentation remains poor now. Response to IV Narcan but response is brief   Interim History / Subjective:  As above   Objective   Blood pressure 117/74, pulse 63, temperature 98 F (36.7 C), temperature source Oral,  resp. rate 16, height 5\' 3"  (1.6 m), weight 54.6 kg, SpO2 97 %.        Intake/Output Summary (Last 24 hours) at 05/14/2020 1055 Last data filed at 05/14/2020 0830 Gross per 24 hour  Intake --  Output 1375 ml  Net -1375 ml   Filed Weights   05/11/20 1637 05/11/20 2259  Weight: 56.7 kg 54.6 kg    Examination: General: Chronic ill-appearing deconditioned thin middle-aged female lying in bed in no distress HEENT: [place]/AT, MM pink/moist, PERRL,  Neuro: Minimally responsive to sternal rub but will open eyes and verbally responding to movement of the left forefoot.  Mentation does improve with IV Narcan push CV: s1s2 regular rate and rhythm, no murmur, rubs, or gallops,  PULM: Clear to auscultation bilaterally, no increased work of breathing, no added breath sounds GI: soft, bowel sounds active in all 4 quadrants, non-tender, non-distended, Extremities: warm/dry, no edema  Skin: no rashes or lesions  Labs/imaging that I havepersonally reviewed    Non-anion metabolic acidosis with pH 7.34 and bicarb 17.7 on ABG  Resolved Hospital Problem list     Assessment & Plan:  Acute encephalopathy  -Likely multifactorial with underlying psychoaffective disorder with poor management -Mentation worsened after visit from family member, concern for substance use  P: Narcan drip  Obtain UDS Narcan drip  Maintain neuro protective measures Nutrition and bowel regiment  Aspirations precautions  Delirium precautions   Acute Kidney Injury  -Baseline creatinine 1.2-1.5, creatinine on admission 3.48 Non-anion metabolic acidosis  -pH 7.10 and bicarb 17.7 on ABG P: Continue BIcarb drip  Follow renal function / urine output Trend Bmet Avoid nephrotoxins Ensure  adequate renal perfusion   Esophageal dysmotility Nausea and vomiting  Profound weight loss  -Barium esophagram with prominent esophageal dysmotility resulting in no movement beyond distal esophagus  P: GI evaluated and recommended EGD  but patient refused  Supportive care  Can consider Cortrack if oral access needed  Hold oral meds   Intermittent sinus bradycardia concern for vagal response  -Two separate episodes of bradycardia this admission that corollate with AMS and required Atropine  Diastolic congestive heart failure  -ECHO 4/28 with EF 55-60% and grade 1 diastolic dysfunction  P: Cardiology following Continuous telemetry  Avoid AV nodal drugs  ECHO as above  Daily weight  Strict I&O  Daily weight    HX of COPD  OSA on CPAP at baseline  P: Encourage pulmonary hygiene  Supplemental oxygen as needed  Given profound weight loss patient my no longer require CPAP will observe   Severe schizoaffective disorder P: Psych evaluated during admission likely need to re consult prior to discharge   Left 8 cm left adnexal cystic lesion P: Needs transvaginal ultrasound but has been refusing workup  Best practice   Diet:  NPO Pain/Anxiety/Delirium protocol (if indicated): No VAP protocol (if indicated): Not indicated DVT prophylaxis: Subcutaneous Heparin GI prophylaxis: N/A Glucose control:  SSI Yes Central venous access:  N/A Arterial line:  N/A Foley:  N/A Mobility:  bed rest  PT consulted: Yes Last date of multidisciplinary goals of care discussion Pending  Code Status:  full code Disposition: ICU  Labs   CBC: Recent Labs  Lab 05/11/20 1230 05/13/20 1230  WBC 7.0 4.0  NEUTROABS  --  2.2  HGB 10.7* 9.3*  HCT 34.6* 28.8*  MCV 89.4 87.3  PLT 371 650    Basic Metabolic Panel: Recent Labs  Lab 05/11/20 1230 05/11/20 1407 05/11/20 2018 05/12/20 0709 05/13/20 0037 05/14/20 0023  NA 138  --   --  139 136 137  K 2.8*  --   --  3.0* 3.5 3.6  CL 111  --   --  116* 115* 114*  CO2 17*  --   --  17* 17* 18*  GLUCOSE 86  --   --  77 91 109*  BUN 28*  --   --  23 19 13   CREATININE 3.48*  --   --  2.48* 2.06* 1.73*  CALCIUM 8.1*  --   --  7.7* 8.1* 8.2*  MG  --  1.2*  --  1.5* 2.1 1.7  PHOS   --   --  3.8  --   --   --    GFR: Estimated Creatinine Clearance: 28.2 mL/min (A) (by C-G formula based on SCr of 1.73 mg/dL (H)). Recent Labs  Lab 05/11/20 1230 05/13/20 1230  WBC 7.0 4.0    Liver Function Tests: Recent Labs  Lab 05/11/20 1230 05/12/20 0709 05/13/20 0037 05/14/20 0023  AST 17 16 17 18   ALT 11 9 9 10   ALKPHOS 96 80 75 73  BILITOT 0.5 0.6 0.4 0.4  PROT 5.3* 4.5* 3.8* 3.8*  ALBUMIN 2.9* 2.4* 2.1* 2.1*   Recent Labs  Lab 05/11/20 1230  LIPASE 25   No results for input(s): AMMONIA in the last 168 hours.  ABG    Component Value Date/Time   PHART 7.344 (L) 05/14/2020 1038   PCO2ART 33.2 05/14/2020 1038   PO2ART 92.6 05/14/2020 1038   HCO3 17.7 (L) 05/14/2020 1038   TCO2 17 (L) 10/27/2019 0523   ACIDBASEDEF 7.0 (H) 05/14/2020  1038   O2SAT 96.9 05/14/2020 1038     Coagulation Profile: No results for input(s): INR, PROTIME in the last 168 hours.  Cardiac Enzymes: No results for input(s): CKTOTAL, CKMB, CKMBINDEX, TROPONINI in the last 168 hours.  HbA1C: HbA1c, POC (controlled diabetic range)  Date/Time Value Ref Range Status  10/11/2017 08:53 AM 5.3 0.0 - 7.0 % Final   Hgb A1c MFr Bld  Date/Time Value Ref Range Status  05/11/2020 08:18 PM 5.3 4.8 - 5.6 % Final    Comment:    (NOTE) Pre diabetes:          5.7%-6.4%  Diabetes:              >6.4%  Glycemic control for   <7.0% adults with diabetes   02/20/2020 02:58 AM 4.8 4.8 - 5.6 % Final    Comment:    (NOTE) Pre diabetes:          5.7%-6.4%  Diabetes:              >6.4%  Glycemic control for   <7.0% adults with diabetes     CBG: Recent Labs  Lab 05/13/20 1116 05/13/20 1549 05/13/20 2340 05/14/20 0810 05/14/20 1011  GLUCAP 84 73 109* 81 102*    Review of Systems:   Unable to assess   Past Medical History:  She,  has a past medical history of Agitation (11/22/2017), Anoxic brain injury ([place]) (09/08/2016), Anxiety, Arthritis, Asthma (10/18/2010), Binge eating disorder,  Cardiac arrest ([place]) (09/08/2016), Carotid artery stenosis, Chronic diastolic (congestive) heart failure ([place]), Chronic kidney disease, Chronic pain syndrome (06/18/2012), Chronic post-traumatic stress disorder (PTSD) (05/27/2018), Chronic respiratory failure with hypoxia and hypercapnia ([place]) (06/22/2015), CKD (chronic kidney disease) stage 3, GFR 30-59 ml/min (HCC) (12/15/2016), Closed displaced fracture of fifth metacarpal bone (03/21/2018), Cocaine use disorder, severe, in sustained remission ([place]) (63/0/1601), Complication of anesthesia, COPD (chronic obstructive pulmonary disease) ([place]) (07/08/2014), Depression, Diabetic neuropathy ([place]) (04/24/2011), Difficulty with speech (01/24/2018), Disorder of nervous system, Drug abuse ([place]) (11/21/2017), Dyslipidemia (04/24/2011), Elevated troponin (04/28/2012), Emphysema, Encephalopathy (11/21/2017), Essential hypertension (03/22/2016), Fibula fracture (07/10/2016), Frequent falls (10/11/2017), GERD (gastroesophageal reflux disease), Gout (04/11/2017), Heart attack ([place]) (1980s), History of blood transfusion (1994), History of drug abuse in remission ([place]) (11/28/2015), Hyperlipidemia LDL goal <70, Incontinence, Manic depression ([place]), Morbid obesity ([place]) (10/18/2010), Obstructive sleep apnea (10/18/2010), On home oxygen therapy, OSA on CPAP, Paranoid ([place]), Prolonged Q-T interval on ECG, Rectal bleeding (12/31/2015), Right carotid bruit (11/09/2016), Schizoaffective disorder, bipolar type ([place]) (04/05/2018), Seasonal allergies, Seborrheic keratoses (12/31/2013), Seizures ([place]), Stroke ([place]) (1980s), Thrush (09/19/2013), and Type 2 diabetes mellitus ([place]) (10/18/2010).   Surgical History:   Past Surgical History:  Procedure Laterality Date  . CESAREAN SECTION  1997  . HERNIA REPAIR    . IR CHOLANGIOGRAM EXISTING TUBE  07/20/2016  . IR PERC CHOLECYSTOSTOMY  05/10/2016  . IR RADIOLOGIST EVAL & MGMT  06/08/2016  . IR RADIOLOGIST EVAL & MGMT  06/29/2016  . IR SINUS/FIST TUBE CHK-NON GI   07/12/2016  . RIGHT/LEFT HEART CATH AND CORONARY ANGIOGRAPHY N/A 06/19/2017   Procedure: RIGHT/LEFT HEART CATH AND CORONARY ANGIOGRAPHY;  Surgeon: Jolaine Artist, MD;  Location: [facility];  Service: Cardiovascular;  Laterality: N/A;  . TIBIA IM NAIL INSERTION Right 07/12/2016   Procedure: INTRAMEDULLARY (IM) NAIL RIGHT TIBIA;  Surgeon: Leandrew Koyanagi, MD;  Location: [facility];  Service: Orthopedics;  Laterality: Right;  . UMBILICAL HERNIA REPAIR  ~ 1963   "that's why I don't have a belly  button"  . VAGINAL HYSTERECTOMY       Social History:   reports that she quit smoking about 4 years ago. Her smoking use included cigarettes. She started smoking about 43 years ago. She has a 57.00 pack-year smoking history. She has never used smokeless tobacco. She reports that she does not drink alcohol and does not use drugs.   Family History:  Her family history includes Anxiety disorder in her sister; Bipolar disorder in her sister; Cancer in her father and mother; Depression in her brother, mother, sister, and sister; Heart failure in an other family member; Schizophrenia in her sister.   Allergies Allergies  Allergen Reactions  . Hydrocodone Shortness Of Breath  . Hydrocodone-Acetaminophen Shortness Of Breath  . Hydroxyzine Anaphylaxis and Shortness Of Breath  . Latuda [Lurasidone Hcl] Anaphylaxis  . Lurasidone Anaphylaxis  . Magnesium-Containing Compounds Anaphylaxis  . Prednisone Anaphylaxis, Swelling and Other (See Comments)    Tongue swelling  . Tramadol Anaphylaxis and Swelling  . Codeine Nausea And Vomiting  . Other Rash  . Sulfa Antibiotics Itching  . Tape Rash     Home Medications  Prior to Admission medications   Medication Sig Start Date End Date Taking? Authorizing Provider  albuterol (PROVENTIL) (2.5 MG/3ML) 0.083% nebulizer solution Take 3 mLs (2.5 mg total) by nebulization every 6 (six) hours as needed for wheezing or shortness of breath. 03/17/20  Yes Collene Gobble, MD   albuterol (VENTOLIN HFA) 108 (90 Base) MCG/ACT inhaler Inhale 2 puffs into the lungs every 6 (six) hours as needed for wheezing or shortness of breath. 03/17/20  Yes Collene Gobble, MD  Asenapine Maleate (SAPHRIS) 10 MG SUBL Place 1 tablet (10 mg total) under the tongue 2 (two) times daily. 04/20/20  Yes Arfeen, Arlyce Harman, MD  atorvastatin (LIPITOR) 20 MG tablet Take 1 tablet (20 mg total) by mouth daily. 03/30/20  Yes [name], MD  Blood Pressure Monitoring (BLOOD PRESSURE CUFF) MISC 1 Units by Does not apply route daily. 11/26/19  Yes Mayers, Cari S, PA-C  colchicine 0.6 MG tablet TAKE 1 TABLET(0.6 MG) BY MOUTH TWICE DAILY Patient taking differently: Take 0.6 mg by mouth 2 (two) times daily. 05/06/20  Yes [name], MD  diazepam (VALIUM) 10 MG tablet Take 1 tablet (10 mg total) by mouth 3 (three) times daily as needed for anxiety. 03/05/20 06/03/20 Yes Pucilowski, Olgierd A, MD  diclofenac Sodium (VOLTAREN) 1 % GEL Apply 4 g topically 4 (four) times daily. 03/30/20  Yes [name], MD  Elastic Bandages & Supports (MEDICAL COMPRESSION STOCKINGS) MISC 1 Units by Does not apply route daily. 11/26/19  Yes Mayers, Cari S, PA-C  escitalopram (LEXAPRO) 20 MG tablet Take 1 tablet (20 mg total) by mouth daily. 03/05/20 03/05/21 Yes Pucilowski, Olgierd A, MD  febuxostat (ULORIC) 40 MG tablet Take 1 tablet (40 mg total) by mouth daily. 03/30/20  Yes [name], MD  ferrous sulfate 325 (65 FE) MG tablet Take 1 tablet (325 mg total) by mouth daily with breakfast. 11/02/19  Yes Florencia Reasons, MD  Fluticasone-Umeclidin-Vilant (TRELEGY ELLIPTA) 100-62.5-25 MCG/INH AEPB Inhale 1 puff into the lungs daily. 03/17/20  Yes Collene Gobble, MD  folic acid (FOLVITE) 1 MG tablet Take 1 tablet (1 mg total) by mouth daily. 02/27/20  Yes Hongalgi, Lenis Dickinson, MD  gabapentin (NEURONTIN) 600 MG tablet Take 600 mg by mouth 2 (two) times daily.   Yes [provider]  lisdexamfetamine (VYVANSE) 20 MG capsule Take 1  capsule (20 mg  total) by mouth daily with breakfast. 04/13/20 05/13/20 Yes Pucilowski, Olgierd A, MD  montelukast (SINGULAIR) 10 MG tablet Take 1 tablet (10 mg total) by mouth at bedtime. 11/28/19  Yes Collene Gobble, MD  Multiple Vitamin (MULTIVITAMIN WITH MINERALS) TABS tablet Take 1 tablet by mouth daily. 02/27/20  Yes Hongalgi, Lenis Dickinson, MD  omeprazole (PRILOSEC) 40 MG capsule Take 1 capsule (40 mg total) by mouth daily. 12/29/19  Yes [name], MD  oxybutynin (DITROPAN) 5 MG tablet Take 5 mg by mouth 2 (two) times daily. 03/18/20  Yes [provider]  potassium chloride SA (KLOR-CON) 20 MEQ tablet Take 40 mEq by mouth 2 (two) times daily.   Yes [provider]  prazosin (MINIPRESS) 2 MG capsule Take 1 capsule (2 mg total) by mouth at bedtime. 03/05/20 06/03/20 Yes Pucilowski, Olgierd A, MD  tiZANidine (ZANAFLEX) 4 MG tablet Take 4 mg by mouth 3 (three) times daily. 04/11/20  Yes [provider]  topiramate (TOPAMAX) 25 MG tablet Take 1 tablet (25 mg total) by mouth 2 (two) times daily. 04/13/20 07/12/20 Yes Pucilowski, Olgierd A, MD  amLODipine (NORVASC) 10 MG tablet Take 1 tablet (10 mg total) by mouth daily. Patient not taking: No sig reported 02/27/20   Modena Jansky, MD  chlorzoxazone (PARAFON FORTE) 250 MG tablet Take 1 tablet (250 mg total) by mouth 4 (four) times daily as needed for muscle spasms. Patient not taking: Reported on 05/11/2020 03/23/20   Hilts, Legrand Como, MD  cyclobenzaprine (FLEXERIL) 10 MG tablet Take 1 tablet (10 mg total) by mouth 2 (two) times daily as needed for muscle spasms. Patient not taking: Reported on 05/11/2020 03/10/20   [name], MD  feeding supplement (ENSURE ENLIVE / ENSURE PLUS) LIQD Take 237 mLs by mouth 2 (two) times daily between meals. Patient not taking: Reported on 05/11/2020 02/26/20   Modena Jansky, MD  gabapentin (NEURONTIN) 300 MG capsule Take 1 capsule (300 mg total) by mouth at bedtime. Patient not taking: No sig  reported 04/20/20 04/20/21  Arfeen, Arlyce Harman, MD  hydrALAZINE (APRESOLINE) 50 MG tablet Take 1 tablet (50 mg total) by mouth 2 (two) times daily. Patient not taking: Reported on 05/11/2020 02/26/20   Modena Jansky, MD  thiamine 100 MG tablet Take 1 tablet (100 mg total) by mouth daily. Patient not taking: No sig reported 02/27/20   Modena Jansky, MD  traZODone (DESYREL) 100 MG tablet Take 1 tablet (100 mg total) by mouth at bedtime. Patient not taking: No sig reported 02/26/20   Modena Jansky, MD  budesonide-formoterol [facility]) 80-4.5 MCG/ACT inhaler Inhale 2 puffs into the lungs 2 (two) times daily. Patient not taking: Reported on 09/29/2019 11/15/18 09/29/19  Maximiano Coss, NP     Critical care time:    Performed by: Johnsie Cancel  Total critical care time: 45 minutes  Critical care time was exclusive of separately billable procedures and treating other patients.  Critical care was necessary to treat or prevent imminent or life-threatening deterioration.  Critical care was time spent personally by me on the following activities: development of treatment plan with patient and/or surrogate as well as nursing, discussions with consultants, evaluation of patient's response to treatment, examination of patient, obtaining history from patient or surrogate, ordering and performing treatments and interventions, ordering and review of laboratory studies, ordering and review of radiographic studies, pulse oximetry and re-evaluation of patient's condition.  Johnsie Cancel, NP-C [facility] Personal contact information can be  found on Amion  If no response please page: Adult pulmonary and critical care medicine pager on Amion unitl 7pm After 7pm please call [phone] 05/14/2020, 12:21 PM

## 2020-05-14 NOTE — Consult Note (Signed)
[facility] Nurse Consult Note: Patient receiving care in [facility] 2H08. Reason for Consult: left heel wound for which there was already an order for Silvadene to the area. Wound type: ruptured blister per flowsheet. Pressure Injury POA: Yes/No/NA Measurement: To be provided by the bedside RN in the flowsheet section Wound bed: Drainage (amount, consistency, odor)  Periwound: Dressing procedure/placement/frequency: Follow previous order for silvadene to heel wound. Cover with non-adherent pad, secure with kerlex. [place] nurse will not follow at this time.  Please re-consult the [facility] team if needed.  Val Riles, RN, MSN, CWOCN, CNS-BC, pager [phone]

## 2020-05-15 DIAGNOSIS — E43 Unspecified severe protein-calorie malnutrition: Secondary | ICD-10-CM | POA: Insufficient documentation

## 2020-05-15 DIAGNOSIS — F250 Schizoaffective disorder, bipolar type: Secondary | ICD-10-CM | POA: Diagnosis not present

## 2020-05-15 DIAGNOSIS — R001 Bradycardia, unspecified: Secondary | ICD-10-CM | POA: Diagnosis not present

## 2020-05-15 LAB — GLUCOSE, CAPILLARY
Glucose-Capillary: 105 mg/dL — ABNORMAL HIGH (ref 70–99)
Glucose-Capillary: 105 mg/dL — ABNORMAL HIGH (ref 70–99)
Glucose-Capillary: 157 mg/dL — ABNORMAL HIGH (ref 70–99)
Glucose-Capillary: 75 mg/dL (ref 70–99)
Glucose-Capillary: 77 mg/dL (ref 70–99)
Glucose-Capillary: 79 mg/dL (ref 70–99)
Glucose-Capillary: 89 mg/dL (ref 70–99)

## 2020-05-15 LAB — MAGNESIUM: Magnesium: 1.6 mg/dL — ABNORMAL LOW (ref 1.7–2.4)

## 2020-05-15 LAB — PHOSPHORUS: Phosphorus: 2.2 mg/dL — ABNORMAL LOW (ref 2.5–4.6)

## 2020-05-15 MED ORDER — CHLORHEXIDINE GLUCONATE 0.12 % MT SOLN
15.0000 mL | Freq: Two times a day (BID) | OROMUCOSAL | Status: DC
  Filled 2020-05-15: qty 15

## 2020-05-15 MED ORDER — ESCITALOPRAM OXALATE 10 MG PO TABS
10.0000 mg | ORAL_TABLET | Freq: Every day | ORAL | Status: DC
  Administered 2020-05-15 – 2020-05-16 (×2): 10 mg via ORAL
  Filled 2020-05-15 (×2): qty 1

## 2020-05-15 MED ORDER — GABAPENTIN 600 MG PO TABS
300.0000 mg | ORAL_TABLET | Freq: Two times a day (BID) | ORAL | Status: DC
  Administered 2020-05-15 – 2020-05-16 (×3): 300 mg via ORAL
  Filled 2020-05-15 (×3): qty 1

## 2020-05-15 MED ORDER — ENSURE ENLIVE PO LIQD
237.0000 mL | Freq: Two times a day (BID) | ORAL | Status: DC
  Administered 2020-05-15: 237 mL via ORAL

## 2020-05-15 MED ORDER — ORAL CARE MOUTH RINSE: 15.0000 mL | Freq: Two times a day (BID) | OROMUCOSAL | Status: DC

## 2020-05-15 MED ORDER — MAGNESIUM SULFATE 2 GM/50ML IV SOLN
2.0000 g | Freq: Once | INTRAVENOUS | Status: AC
  Administered 2020-05-15: 2 g via INTRAVENOUS
  Filled 2020-05-15: qty 50

## 2020-05-15 MED ORDER — DIAZEPAM 5 MG PO TABS
5.0000 mg | ORAL_TABLET | Freq: Three times a day (TID) | ORAL | Status: DC | PRN
  Administered 2020-05-15: 5 mg via ORAL
  Filled 2020-05-15: qty 1

## 2020-05-15 MED ORDER — DIAZEPAM 2 MG PO TABS
10.0000 mg | ORAL_TABLET | Freq: Three times a day (TID) | ORAL | Status: DC | PRN
  Administered 2020-05-15: 10 mg via ORAL
  Filled 2020-05-15: qty 5

## 2020-05-15 NOTE — Evaluation (Signed)
Physical Therapy Evaluation Patient Details Name: Tiffany [name] MRN: [id number] DOB: 1960/08/08 Today's Date: 05/15/2020   History of Present Illness  60 yo admitted 4/26 with N/V/D and AKI. PMHx: schizoaffective disorder, HTN, HLD, COPD, anemia, gout.  Had unresponsive episode and bradycardia on 4/29, concern for possible ingestion of pills and narcan given.  Patient transferred to ICU.  Clinical Impression  Patient presents with decreased mobility due to generalized weakness, decreased balance, decreased safety awareness and is at risk for falls, though per her report no falls previously.  She may need inpatient psychiatric stay, but current mobility status would benefit from follow up HHPT and intermittent supervision.  PT to continue to follow acutely.     Follow Up Recommendations Home health PT;Supervision - Intermittent;Other (comment) (may need inpatient psychiatric stay prior to d/c.)    Equipment Recommendations  None recommended by PT    Recommendations for Other Services       Precautions / Restrictions Precautions Precautions: Fall Precaution Comments: on suicide precautions      Mobility  Bed Mobility Overal bed mobility: Needs Assistance Bed Mobility: Supine to Sit     Supine to sit: Min guard     General bed mobility comments: assist for balance/lines    Transfers Overall transfer level: Needs assistance Equipment used: Rolling walker (2 wheeled) Transfers: Sit to/from Stand Sit to Stand: Min guard;Min assist         General transfer comment: CGA from EOB, then min A from toilet  Ambulation/Gait Ambulation/Gait assistance: Min guard Gait Distance (Feet): 12 Feet (x 2) Assistive device: Rolling walker (2 wheeled) Gait Pattern/deviations: Step-to pattern;Step-through pattern;Decreased stride length     General Gait Details: to and from bathroom, assist for lines, safety, pt able to negotiate walker over lip of bathroom  Stairs             Wheelchair Mobility    Modified Rankin (Stroke Patients Only)       Balance Overall balance assessment: Needs assistance Sitting-balance support: Feet supported Sitting balance-Leahy Scale: Fair     Standing balance support: Bilateral upper extremity supported Standing balance-Leahy Scale: Poor Standing balance comment: UE support in standing, but reports legs feel stronger than prior to illness                             Pertinent Vitals/Pain Pain Assessment: 0-10 Pain Score: 4  Pain Location: R heel Pain Descriptors / Indicators: Sore Pain Intervention(s): Monitored during session    Home Living Family/patient expects to be discharged to:: Shelter/Homeless                 Additional Comments: reports lives alone "at home" with 2 steps to enter one level, but sitter states pt is homeless    Prior Function Level of Independence: Independent with assistive device(s)         Comments: states using a walker at baseline     Hand Dominance   Dominant Hand: Right    Extremity/Trunk Assessment   Upper Extremity Assessment Upper Extremity Assessment: Generalized weakness    Lower Extremity Assessment Lower Extremity Assessment: Generalized weakness    Cervical / Trunk Assessment Cervical / Trunk Assessment: Kyphotic  Communication   Communication: No difficulties  Cognition Arousal/Alertness: Awake/alert Behavior During Therapy: WFL for tasks assessed/performed Overall Cognitive Status: No family/caregiver present to determine baseline cognitive functioning  General Comments: oriented to place and appropriately asking for new brief and mentioned the pure wick she still had in place. gentle with blowing her nose due to core track      General Comments General comments (skin integrity, edema, etc.): Patient performing toilet hygiene and able protect coretrack while blowing her nose.  increased  time to pull toilet paper from roll, but safe with S assist    Exercises     Assessment/Plan    PT Assessment Patient needs continued PT services  PT Problem List Decreased strength;Decreased knowledge of use of DME;Decreased activity tolerance;Decreased mobility;Decreased knowledge of precautions;Decreased safety awareness       PT Treatment Interventions Balance training;DME instruction;Gait training;Functional mobility training;Patient/family education;Therapeutic activities;Therapeutic exercise    PT Goals (Current goals can be found in the Care Plan section)  Acute Rehab PT Goals Patient Stated Goal: return to independent PT Goal Formulation: With patient Time For Goal Achievement: 05/29/20 Potential to Achieve Goals: Good    Frequency Min 3X/week   Barriers to discharge Decreased caregiver support;Inaccessible home environment homeless?    Co-evaluation               AM-PAC PT "6 Clicks" Mobility  Outcome Measure Help needed turning from your back to your side while in a flat bed without using bedrails?: A Little Help needed moving from lying on your back to sitting on the side of a flat bed without using bedrails?: A Little Help needed moving to and from a bed to a chair (including a wheelchair)?: A Little Help needed standing up from a chair using your arms (e.g., wheelchair or bedside chair)?: A Little Help needed to walk in hospital room?: A Little Help needed climbing 3-5 steps with a railing? : A Little 6 Click Score: 18    End of Session   Activity Tolerance: Patient limited by fatigue Patient left: in chair;with call bell/phone within reach;with nursing/sitter in room   PT Visit Diagnosis: Other abnormalities of gait and mobility (R26.89)    Time: 6808-8110 PT Time Calculation (min) (ACUTE ONLY): 28 min   Charges:   PT Evaluation $PT Eval Moderate Complexity: 1 Mod PT Treatments $Gait Training: 8-22 mins        Tiffany [name], PT Acute  Rehabilitation Services [phone] Office:[phone] 05/15/2020   Tiffany [name] 05/15/2020, 11:11 AM

## 2020-05-15 NOTE — Progress Notes (Addendum)
Coretrak removed and soft diet order placed per order. Soft Regular diet placed, patient has no teeth and is malnourished.

## 2020-05-15 NOTE — Progress Notes (Addendum)
Patient's daughter, Dolores Lory serve as contact but requested that the patient not be given her number: [phone]. For discharge, local contacts are patient's mother Casimer Leek: [phone] and patient's sister Kandyce Rud: [phone].

## 2020-05-15 NOTE — Progress Notes (Signed)
Moderate asymptomatic sinus bradycardia, minimum 38 bpm. Now sinus rhythm 70s. No cardiac complaints. Normal echo. Normal coronaries and mild PAH at cath 2019 ([facility]). She denies "taking anything". Will sign off. Please reconsult as needed. No urgent Cardiology follow up appears to be necessary. Last saw Dr. Einar Gip July 2019, but had seen CHF clinic and Dr. Haroldine Laws prior to that and has been getting med refills from CHF clinic. Would have her decide where she prefers follow up.

## 2020-05-15 NOTE — Progress Notes (Signed)
PROGRESS NOTE   Tiffany [name]  YBO:[id number] DOB: 18-Feb-1960 DOA: 05/11/2020 PCP: [name], MD  Brief Narrative:  63 community dwelling black female Prior admission 2/2-->02/26/2020 Known schizoaffective disorder with psychosis involuntarily committed at that admission and was noted to not be taking her medications as she should Came in with AKI last admission E. coli cystitis and metabolic encephalopathy along with cystic structure superior to the bladder for outpatient follow-up  Developed watery diarrhea 3 weeks prior to admission 3-4 times a day + nausea + vomit-also dysphagia, inability to take medication X 3 days  Hospital-Problem based course  Symptomatic sinus bradycardia--?  Benadryl surreptitiously taken? Had episodic bradycardia with unresponsiveness 4/28 and 4/29 requiring atropine-transiently sent to ICU for monitoring and EP consulted Echocardiogram negative for any structural disease TSH 0.832, free cortisol random was 6.2 Appreciate cardiology seeing the patient-they have signed off-patient will discussed with me who she wishes to follow-up with in the outpatient setting either Dr. Algernon Huxley or Dr. Einar Gip Potential surreptitious drug use Reported possible Benadryl taken out of her bag per CCM MD 4/29 to me Patient belongings under strict lock and key at this time Needs safety sitter at this time but can discontinue in a.m. AKI on admission likely secondary to diarrhea, N, V associated Met acidosis Patient refusing labs-unclear renal function Dysphagia?,  Weight loss Patient is edentulous and does not have dentures declined barium swallow, EGD and other testing 4/27 If patient is able to tolerate a regular diet she can be discharged tomorrow if she is otherwise stable Hypomagnesemia, hypokalemia Patient refusing labs-unclear electrolytes at this time DM TY 2 Diabetic diet-CBG 79-1 57-continue sliding scale HTN Amlodipine 10 on hold   Schizophrenia-delusions Appreciate psychiatry input Resume Valium at 5 mg 3 times daily Lexapro at 10 mg gabapentin at 300 twice daily Would keep on these lower doses for now until she follows up in the outpatient setting   DVT prophylaxis:  Continue heparin 3 times daily Code Status: Full Family Communication: Discussed with patient's son on telephone on 4/29 Amarrah, Meinhart   [phone]  No HCPOA on file 3 children-Anthony is middle child--is estranged from her---Spoke with her ~ 1 month prior   Disposition:  Status is: Inpatient  Remains inpatient appropriate because:Hemodynamically unstable, Ongoing diagnostic testing needed not appropriate for outpatient work up and Unsafe d/c plan   Dispo: The patient is from: Home              Anticipated d/c is to: Home              Patient currently is not medically stable to d/c.   Difficult to place patient No    Consultants:   Gastroenterology  GI  Procedures: None yet  Antimicrobials: None   Subjective:  Patient much more awake alert has core track in place Is sitting out of bed in the chair Tells me she does not have dentures which is why she probably has lost weight No sticking in the throat and able to tolerate diet    Objective: Vitals:   05/15/20 0713 05/15/20 0800 05/15/20 0900 05/15/20 1000  BP: 108/66 (!) 86/58 100/69   Pulse:  (!) 52 (!) 50 (!) 58  Resp:  _0 Temp: 98 F (36.7 C)     TempSrc: Oral     SpO2:  95% 97% 100%  Weight:      Height:        Intake/Output Summary (Last 24 hours) at 05/15/2020 1012  Last data filed at 05/15/2020 0900 Gross per 24 hour  Intake 2852.16 ml  Output 1200 ml  Net 1652.16 ml   Filed Weights   05/11/20 1637 05/11/20 2259 05/15/20 0500  Weight: 56.7 kg 54.6 kg 59.5 kg    Examination:  Coherent X4 no distress Core track in right nare Edentulous Abdomen soft no rebound no guarding Chest clear no added sound No lower extremity  edema Neurologically intact  Data Reviewed: personally reviewed     Patient refused labs today   Radiology Studies: CT HEAD WO CONTRAST  Result Date: 05/14/2020 CLINICAL DATA:  Altered mental status EXAM: CT HEAD WITHOUT CONTRAST TECHNIQUE: Contiguous axial images were obtained from the base of the skull through the vertex without intravenous contrast. COMPARISON:  10/27/2019 FINDINGS: Brain: No evidence of acute infarction, hemorrhage, hydrocephalus, extra-axial collection or mass lesion/mass effect. Subcortical white matter and periventricular small vessel ischemic changes. Vascular: No hyperdense vessel or unexpected calcification. Skull: Normal. Negative for fracture or focal lesion. Sinuses/Orbits: Partial opacification of the left mastoid air cells. Visualized paranasal sinuses are clear. Other: None. IMPRESSION: No evidence of acute intracranial abnormality. Small vessel ischemic changes. Electronically Signed   By: Julian Hy M.D.   On: 05/14/2020 11:28   DG Abd Portable 1V  Result Date: 05/14/2020 CLINICAL DATA:  S/p NG feeding tube placement. EXAM: PORTABLE ABDOMEN - 1 VIEW COMPARISON:  None. FINDINGS: Weighted tip feeding tube appears appropriately positioned within the stomach with tip directed towards the duodenal bulb. Visualized bowel gas pattern is nonobstructive. IMPRESSION: Feeding tube appears appropriately positioned within the stomach with tip directed towards the duodenal bulb. Electronically Signed   By: Franki Cabot M.D.   On: 05/14/2020 16:30   ECHOCARDIOGRAM COMPLETE  Result Date: 05/13/2020    ECHOCARDIOGRAM REPORT   Patient Name:   Tiffany [name] [facility] Date of Exam: 05/13/2020 Medical Rec #:  [id number]       Height:       63.0 in Accession #:    [id number]      Weight:       120.4 lb Date of Birth:  31-Jan-1960       BSA:          1.558 m Patient Age:    2 years        BP:           94/58 mmHg Patient Gender: F               HR:           48 bpm. Exam Location:   Inpatient Procedure: 2D Echo STAT ECHO Indications:     cardiac arrest  History:         Patient has prior history of Echocardiogram examinations, most                  recent 10/30/2016. COPD; Risk Factors:Diabetes and                  Dyslipidemia.  Sonographer:     Johny Chess Referring Phys:  [id number] [place] Diagnosing Phys: Eleonore Chiquito MD IMPRESSIONS  1. Left ventricular ejection fraction, by estimation, is 55 to 60%. The left ventricle has normal function. The left ventricle has no regional wall motion abnormalities. Left ventricular diastolic parameters are consistent with Grade I diastolic dysfunction (impaired relaxation).  2. Right ventricular systolic function is normal. The right ventricular size is normal. There is normal pulmonary artery systolic pressure. The estimated right ventricular  systolic pressure is 09.8 mmHg.  3. Left atrial size was mildly dilated.  4. The mitral valve is grossly normal. Trivial mitral valve regurgitation. No evidence of mitral stenosis.  5. The aortic valve is tricuspid. Aortic valve regurgitation is not visualized. No aortic stenosis is present.  6. The inferior vena cava is normal in size with greater than 50% respiratory variability, suggesting right atrial pressure of 3 mmHg. FINDINGS  Left Ventricle: Left ventricular ejection fraction, by estimation, is 55 to 60%. The left ventricle has normal function. The left ventricle has no regional wall motion abnormalities. The left ventricular internal cavity size was normal in size. There is  no left ventricular hypertrophy. Left ventricular diastolic parameters are consistent with Grade I diastolic dysfunction (impaired relaxation). Right Ventricle: The right ventricular size is normal. No increase in right ventricular wall thickness. Right ventricular systolic function is normal. There is normal pulmonary artery systolic pressure. The tricuspid regurgitant velocity is 2.36 m/s, and  with an assumed right  atrial pressure of 3 mmHg, the estimated right ventricular systolic pressure is 11.9 mmHg. Left Atrium: Left atrial size was mildly dilated. Right Atrium: Right atrial size was normal in size. Pericardium: Trivial pericardial effusion is present. Mitral Valve: The mitral valve is grossly normal. Trivial mitral valve regurgitation. No evidence of mitral valve stenosis. Tricuspid Valve: The tricuspid valve is grossly normal. Tricuspid valve regurgitation is trivial. No evidence of tricuspid stenosis. Aortic Valve: The aortic valve is tricuspid. Aortic valve regurgitation is not visualized. No aortic stenosis is present. Pulmonic Valve: The pulmonic valve was grossly normal. Pulmonic valve regurgitation is not visualized. No evidence of pulmonic stenosis. Aorta: The aortic root is normal in size and structure. Venous: The right upper pulmonary vein is normal. The inferior vena cava is normal in size with greater than 50% respiratory variability, suggesting right atrial pressure of 3 mmHg. IAS/Shunts: The atrial septum is grossly normal.  LEFT VENTRICLE PLAX 2D LVIDd:         4.70 cm  Diastology LVIDs:         3.20 cm  LV e' medial:    4.24 cm/s LV PW:         1.10 cm  LV E/e' medial:  14.3 LV IVS:        0.90 cm  LV e' lateral:   5.55 cm/s LVOT diam:     1.90 cm  LV E/e' lateral: 11.0 LV SV:         85 LV SV Index:   54 LVOT Area:     2.84 cm  RIGHT VENTRICLE            IVC RV S prime:     8.05 cm/s  IVC diam: 1.30 cm TAPSE (M-mode): 2.1 cm LEFT ATRIUM             Index       RIGHT ATRIUM           Index LA diam:        3.90 cm 2.50 cm/m  RA Area:     10.50 cm LA Vol (A2C):   48.5 ml 31.12 ml/m RA Volume:   22.60 ml  14.50 ml/m LA Vol (A4C):   59.5 ml 38.18 ml/m LA Biplane Vol: 54.5 ml 34.97 ml/m  AORTIC VALVE LVOT Vmax:   128.00 cm/s LVOT Vmean:  77.200 cm/s LVOT VTI:    0.299 m  AORTA Ao Root diam: 2.80 cm MITRAL VALVE  TRICUSPID VALVE MV Area (PHT): 2.44 cm    TR Peak grad:   22.3 mmHg MV Decel  Time: 311 msec    TR Vmax:        236.00 cm/s MV E velocity: 60.80 cm/s MV A velocity: 73.30 cm/s  SHUNTS MV E/A ratio:  0.83        Systemic VTI:  0.30 m                            Systemic Diam: 1.90 cm Eleonore Chiquito MD Electronically signed by Eleonore Chiquito MD Signature Date/Time: 05/13/2020/2:27:11 PM    Final (Updated)      Scheduled Meds: . Chlorhexidine Gluconate Cloth  6 each Topical Daily  . diclofenac Sodium  4 g Topical TID AC & HS  . fluticasone furoate-vilanterol  1 puff Inhalation Daily   And  . umeclidinium bromide  1 puff Inhalation Daily  . folic acid  1 mg Oral Daily  . heparin  5,000 Units Subcutaneous Q8H  . insulin aspart  0-9 Units Subcutaneous Q4H  . pantoprazole  40 mg Oral Daily  . silver sulfADIAZINE   Topical Daily  . thiamine  100 mg Per Tube Daily   Continuous Infusions: . feeding supplement (VITAL 1.5 CAL) 30 mL/hr at 05/15/20 0800  . sodium bicarbonate 150 mEq in D5W infusion 100 mL/hr at 05/15/20 0900     LOS: 4 days   Time spent: Scott, MD [facility] To contact the attending provider between 7A-7P or the covering provider during after hours 7P-7A, please log into the web site [email or web address] and access using universal [place] password for that web site. If you do not have the password, please call the hospital operator.  05/15/2020, 10:12 AM

## 2020-05-15 NOTE — Progress Notes (Signed)
Called daughter [phone]. [nurse in [place]] and apprised of current clinical scenario--she will convey information to relatives and can serve as primary contact. All q's answered   Verneita Griffes, MD [facility] 5:44 PM

## 2020-05-15 NOTE — Progress Notes (Signed)
Informed by phlebotomist that 2 techs have tried 3x each to obtain blood sample and are unable and patient is irritated and refusing any more attempts.Per protocol, phlebotomy is unable to attempt again for 24 hours. Paged Dr. Verlon Au. RN told to hold blood draws, stat labs cancelled.

## 2020-05-16 DIAGNOSIS — F250 Schizoaffective disorder, bipolar type: Secondary | ICD-10-CM | POA: Diagnosis not present

## 2020-05-16 LAB — GLUCOSE, CAPILLARY
Glucose-Capillary: 93 mg/dL (ref 70–99)
Glucose-Capillary: 103 mg/dL — ABNORMAL HIGH (ref 70–99)

## 2020-05-16 MED ORDER — ESCITALOPRAM OXALATE 20 MG PO TABS: 10.0000 mg | ORAL_TABLET | Freq: Every day | ORAL | 2 refills | Status: DC

## 2020-05-16 MED ORDER — GABAPENTIN 600 MG PO TABS: 300.0000 mg | ORAL_TABLET | Freq: Two times a day (BID) | ORAL | 0 refills | Status: DC

## 2020-05-16 NOTE — TOC Transition Note (Addendum)
Transition of Care [facility]) - CM/SW Discharge Note   Patient Details  Name: Tiffany [name] MRN: [id number] Date of Birth: 04-11-1960  [facility] CM/SW Contact:  Carles Collet, RN Phone Number: 05/16/2020, 12:40 PM   Clinical Narrative:    Damaris Schooner w patient on the phone. [name] agreeable to [facility] services. Currently has aid from [place], wants more services through [place]. Referral called to answering service. They are unable to staff PT. CM will continue to establish [facility].  [facility] accepted with start of care in 4-5 days.  RW to be delivered to room prior to DC.  Patient declined needing assistance with transportation, states she has a ride.     Final next level of care: [place] Barriers to Discharge: No Barriers Identified   Patient Goals and CMS Choice Patient states their goals for this hospitalization and ongoing recovery are:: to go home CMS Medicare.gov Compare Post Acute Care list provided to:: Patient Choice offered to / list presented to : Patient  Discharge Placement                       Discharge Plan and Services                DME Arranged: [place] rolling DME Agency: [employer] Date DME Agency Contacted: 05/16/20 Time DME Agency Contacted: 66 Representative spoke with at DME Agency: Jodell Cipro HH Arranged: [place]: [place] Date [place]: 05/16/20 Time [place]: 73 Representative spoke with at [place]: answering service  Social Determinants of Health ([place]) Interventions     Readmission Risk Interventions Readmission Risk Prevention Plan 02/23/2020  Transportation Screening Complete  Medication Review Press photographer) Complete  PCP or Specialist appointment within 3-5 days of discharge Complete  HRI or [place] Complete  SW Recovery Care/Counseling Consult Complete  Palliative Care Screening Complete  Skilled Nursing Facility Complete  Some  recent data might be hidden

## 2020-05-16 NOTE — Discharge Summary (Signed)
Physician Discharge Summary  Tiffany [name] TTS:[id number] DOB: 1960/03/18 DOA: 05/11/2020  PCP: Tiffany Rakes, MD  Admit date: 05/11/2020 Discharge date: 05/16/2020  Time spent: 37 minutes  Recommendations for Outpatient Follow-up:  1. Recommend outpatient psychiatry input for her severe schizoaffective disorder and fixed delusions 2. Patient will continue to demonstrate significant noncompliance with medical therapy-has continuously refused labs in the hospital 3. Patient had surreptitious intake of Benadryl causing bradycardia this admission-she is very high risk for readmission secondary to overdose potential and hence no refills on her meds were prescribed on discharge  Discharge Diagnoses:  MAIN problem for hospitalization   Diarrhea Symptomatic bradycardia Fixed delusions  Please see below for itemized issues addressed in Tiffany [name]- refer to other progress notes for clarity if needed  Discharge Condition: Guarded  Diet recommendation: Heart healthy  Filed Weights   05/11/20 1637 05/11/20 2259 05/15/20 0500  Weight: 56.7 kg 54.6 kg 59.5 kg    History of present illness:  60 community dwelling black female Prior admission 2/2-->02/26/2020 Known schizoaffective disorder with psychosis involuntarily committed at that admission and was noted to not be taking her medications as she should Came in with AKI last admission E. coli cystitis and metabolic encephalopathy along with cystic structure superior to the bladder for outpatient follow-up  Developed watery diarrhea 3 weeks prior to admission 3-4 times a day + nausea + vomit-also dysphagia, inability to take medication X 3 days Hospitalization complicated by 2 episodes of bradycardia back-to-back on separate days where it was suspected that she had ingestion of unknown substance?  (Benadryl) She was transiently on the critical care service and then transferred back to [facility] on 4/30 [name] continue to demonstrate noncompliance  with medical care-has refused labs repetitively and seems to want to direct her care  Hospital Course:  Symptomatic sinus bradycardia--?  Benadryl surreptitiously taken? Had episodic bradycardia with unresponsiveness 4/28 and 4/29 requiring atropine-transiently sent to ICU for monitoring and EP consulted Echocardiogram negative for any structural disease TSH 0.832, free cortisol random was 6.2 Appreciate cardiology-it is unlikely she will comply with follow-up in the  outpatient setting with either Dr. Algernon [name] or Dr. Einar [name] Potential surreptitious drug use Reported possible Benadryl taken out of her bag per CCM MD 4/29 to me Patient belongings under strict lock and key at this time AKI on admission likely secondary to diarrhea, N, V associated Met acidosis Patient refusing labs-unclear renal function Dysphagia?,  Weight loss Patient is edentulous and does not have dentures declined barium swallow, EGD and other testing 4/27 Eating regular diet Is edentulous No further reports of diarrhea so stable for discharge Hypomagnesemia, hypokalemia Patient refusing labs DM TY 2 Resume home meds on discharge HTN Amlodipine 10 on hold  Schizophrenia-delusions Appreciate psychiatry input Resume Valium at 10 mg 3 times daily--- no refills prescribed Lexapro at 10 mg--no refills prescribed gabapentin at 300 twice daily--- no refills prescribed Would keep on these lower doses for now until she follows up in the outpatient setting Heel sacral decubitus/pressure ulcer Silvadene applied to left heel-patient may continue in the outpatient setting  Procedures: Echocardiogram Consultations:  Cardiology   Discharge Exam: Vitals:   05/16/20 0356 05/16/20 0707  BP: 100/65 105/73  Pulse: (!) 56 (!) 46  Resp: 19 14  Temp: 97.7 F (36.5 C) 97.8 F (36.6 C)  SpO2: 93% 95%    Subj on day of d/c   Patient awake arousable No distress   General Exam on discharge  EOMI NCAT no focal  deficit  CTA B no added sound Abdomen soft nontender no rebound no guarding Raises arms above head power 5/5 bilaterally ROM intact Psych euthymic  Discharge Instructions   Discharge Instructions    Diet - low sodium heart healthy   Complete by: As directed    Discharge instructions   Complete by: As directed    Please take your medications as indicated ensure that you follow-up with your primary care physician You will need to decide whether you want to follow-up with [facility] heart care or Dr. Einar [name] of cardiology-you have seen both services in the past Please take your medications as indicated You will not be prescribed any controlled substances on discharge-please follow-up with your primary care physician   Increase activity slowly   Complete by: As directed    No wound care   Complete by: As directed      Allergies as of 05/16/2020      Reactions   Hydrocodone Shortness Of Breath   Hydrocodone-acetaminophen Shortness Of Breath   Hydroxyzine Anaphylaxis, Shortness Of Breath   Latuda [lurasidone Hcl] Anaphylaxis   Lurasidone Anaphylaxis   Magnesium-containing Compounds Anaphylaxis   Tolerated Ensure   Prednisone Anaphylaxis, Swelling, Other (See Comments)   Tongue swelling   Tramadol Anaphylaxis, Swelling   Codeine Nausea And Vomiting   Other Rash   Sulfa Antibiotics Itching   Tape Rash      Medication List    STOP taking these medications   Asenapine Maleate 10 MG Subl Commonly known as: Saphris   atorvastatin 20 MG tablet Commonly known as: LIPITOR   Blood Pressure Cuff Misc   chlorzoxazone 250 MG tablet Commonly known as: PARAFON FORTE   colchicine 0.6 MG tablet   cyclobenzaprine 10 MG tablet Commonly known as: FLEXERIL   febuxostat 40 MG tablet Commonly known as: ULORIC   ferrous sulfate 325 (65 FE) MG tablet   folic acid 1 MG tablet Commonly known as: FOLVITE   hydrALAZINE 50 MG tablet Commonly known as: APRESOLINE   lisdexamfetamine 20 MG  capsule Commonly known as: Vyvanse   Medical Compression Stockings Misc   multivitamin with minerals Tabs tablet   potassium chloride SA 20 MEQ tablet Commonly known as: KLOR-CON   prazosin 2 MG capsule Commonly known as: MINIPRESS   tiZANidine 4 MG tablet Commonly known as: ZANAFLEX   topiramate 25 MG tablet Commonly known as: Topamax   traZODone 100 MG tablet Commonly known as: DESYREL     TAKE these medications   albuterol (2.5 MG/3ML) 0.083% nebulizer solution Commonly known as: PROVENTIL Take 3 mLs (2.5 mg total) by nebulization every 6 (six) hours as needed for wheezing or shortness of breath.   albuterol 108 (90 Base) MCG/ACT inhaler Commonly known as: VENTOLIN HFA Inhale 2 puffs into the lungs every 6 (six) hours as needed for wheezing or shortness of breath.   amLODipine 10 MG tablet Commonly known as: NORVASC Take 1 tablet (10 mg total) by mouth daily.   diazepam 10 MG tablet Commonly known as: VALIUM Take 1 tablet (10 mg total) by mouth 3 (three) times daily as needed for anxiety.   diclofenac Sodium 1 % Gel Commonly known as: Voltaren Apply 4 g topically 4 (four) times daily.   escitalopram 20 MG tablet Commonly known as: Lexapro Take 0.5 tablets (10 mg total) by mouth daily. What changed: how much to take   feeding supplement Liqd Take 237 mLs by mouth 2 (two) times daily between meals.   gabapentin 600 MG tablet  Commonly known as: NEURONTIN Take 0.5 tablets (300 mg total) by mouth 2 (two) times daily. What changed:   how much to take  Another medication with the same name was removed. Continue taking this medication, and follow the directions you see here.   montelukast 10 MG tablet Commonly known as: SINGULAIR Take 1 tablet (10 mg total) by mouth at bedtime.   omeprazole 40 MG capsule Commonly known as: PRILOSEC Take 1 capsule (40 mg total) by mouth daily.   oxybutynin 5 MG tablet Commonly known as: DITROPAN Take 5 mg by mouth 2  (two) times daily.   thiamine 100 MG tablet Take 1 tablet (100 mg total) by mouth daily.   Trelegy Ellipta 100-62.5-25 MCG/INH Aepb Generic drug: Fluticasone-Umeclidin-Vilant Inhale 1 puff into the lungs daily.      Allergies  Allergen Reactions  . Hydrocodone Shortness Of Breath  . Hydrocodone-Acetaminophen Shortness Of Breath  . Hydroxyzine Anaphylaxis and Shortness Of Breath  . Latuda [Lurasidone Hcl] Anaphylaxis  . Lurasidone Anaphylaxis  . Magnesium-Containing Compounds Anaphylaxis    Tolerated Ensure  . Prednisone Anaphylaxis, Swelling and Other (See Comments)    Tongue swelling  . Tramadol Anaphylaxis and Swelling  . Codeine Nausea And Vomiting  . Other Rash  . Sulfa Antibiotics Itching  . Tape Rash    Follow-up Information    [facility].   Contact information: [address] [place] [address] [phone]               The results of significant diagnostics from this hospitalization (including imaging, microbiology, ancillary and laboratory) are listed below for reference.    Significant Diagnostic Studies: CT Abdomen Pelvis Wo Contrast  Result Date: 05/11/2020 CLINICAL DATA:  60 year old female with abdominal pain. EXAM: CT ABDOMEN AND PELVIS WITHOUT CONTRAST TECHNIQUE: Multidetector CT imaging of the abdomen and pelvis was performed following the standard protocol without IV contrast. COMPARISON:  CT abdomen pelvis dated 10/30/2019 abdominal radiograph dated 02/21/2020. FINDINGS: Evaluation of this exam is limited in the absence of intravenous contrast. Lower chest: The visualized lung bases are clear. No intra-abdominal free air or free fluid. Hepatobiliary: The liver is unremarkable. No intrahepatic biliary dilatation. Multiple stones noted in the gallbladder. No pericholecystic fluid or evidence of acute cholecystitis by CT. Pancreas: The pancreas is suboptimally visualized but grossly  unremarkable. No active inflammatory changes. Spleen: Normal in size without focal abnormality. Adrenals/Urinary Tract: The adrenal glands unremarkable. The kidneys, and the visualized ureters appear unremarkable. There is apparent focal area thickening of the left posterior bladder wall measuring approximately 5 mm in thickness (66/3 and 57/6). This area is suboptimally evaluated by CT. A urothelial lesion is not excluded. Further evaluation with cystoscopy is recommended. Stomach/Bowel: There is sigmoid diverticulosis and scattered colonic diverticula without active inflammatory changes. There is no bowel obstruction or active inflammation. The appendix is normal. Vascular/Lymphatic: Mild aortoiliac atherosclerotic disease. The IVC is unremarkable. No portal venous gas. There is no adenopathy. Reproductive: Hysterectomy. There is an 8 cm cystic lesion arising from the left adnexa. Because this lesion is not adequately characterized, prompt [name] is recommended for further evaluation. Note: This recommendation does not apply to premenarchal patients and to those with increased risk (genetic, family history, elevated tumor markers or other high-risk factors) of ovarian cancer. Reference: JACR 2020 Feb; 17(2):248-254 Other: Mild diffuse subcutaneous stranding. No fluid collection. Musculoskeletal: Osteopenia with degenerative changes of the spine. Grade 1 L3-L4 anterolisthesis. Multilevel facet arthropathy. No acute  osseous pathology. IMPRESSION: 1. No acute intra-abdominal or pelvic pathology. 2. Colonic diverticulosis. No bowel obstruction. Normal appendix. 3. Cholelithiasis. 4. A 8 cm left adnexal cystic lesion. Further evaluation with ultrasound is recommended. 5. Aortic Atherosclerosis (ICD10-I70.0). Electronically Signed   By: Anner Crete M.D.   On: 05/11/2020 15:58   CT HEAD WO CONTRAST  Result Date: 05/14/2020 CLINICAL DATA:  Altered mental status EXAM: CT HEAD WITHOUT CONTRAST TECHNIQUE: Contiguous  axial images were obtained from the base of the skull through the vertex without intravenous contrast. COMPARISON:  10/27/2019 FINDINGS: Brain: No evidence of acute infarction, hemorrhage, hydrocephalus, extra-axial collection or mass lesion/mass effect. Subcortical white matter and periventricular small vessel ischemic changes. Vascular: No hyperdense vessel or unexpected calcification. Skull: Normal. Negative for fracture or focal lesion. Sinuses/Orbits: Partial opacification of the left mastoid air cells. Visualized paranasal sinuses are clear. Other: None. IMPRESSION: No evidence of acute intracranial abnormality. Small vessel ischemic changes. Electronically Signed   By: Julian Hy M.D.   On: 05/14/2020 11:28   DG Abd Portable 1V  Result Date: 05/14/2020 CLINICAL DATA:  S/p NG feeding tube placement. EXAM: PORTABLE ABDOMEN - 1 VIEW COMPARISON:  None. FINDINGS: Weighted tip feeding tube appears appropriately positioned within the stomach with tip directed towards the duodenal bulb. Visualized bowel gas pattern is nonobstructive. IMPRESSION: Feeding tube appears appropriately positioned within the stomach with tip directed towards the duodenal bulb. Electronically Signed   By: Franki Cabot M.D.   On: 05/14/2020 16:30   DG UGI W SINGLE CM (SOL OR THIN BA)  Result Date: 05/13/2020 CLINICAL DATA:  Nausea and vomiting. EXAM: UPPER GI SERIES WITH KUB TECHNIQUE: After obtaining a scout radiograph a routine upper GI series was performed using thin and high density barium. FLUOROSCOPY TIME:  Fluoroscopy Time:  36 seconds Radiation Exposure Index (if provided by the fluoroscopic device): 46.10 mGy Number of Acquired Spot Images: 2 COMPARISON:  CT abdomen/pelvis 05/11/2020. FINDINGS: The examination is somewhat limited due to limited patient positioning. A scout radiograph of the abdomen demonstrates a nonobstructive bowel gas pattern. A round metallic foreign body projected over the left abdomen. This  reflected a coin overlying the patient, which was subsequently removed. Slightly patulous esophagus. Prominent intermittent esophageal dysmotility with tertiary contractions. There is no appreciable fixed high-grade esophageal stricture with the patient drinking liquid barium. However, a swallowed 13 mm barium tablet did not pass beyond the level of the distal esophagus despite a prolonged period of observation, and this may reflect a mild smooth stricture at this site. Small hiatal hernia. Moderate volume gastroesophageal reflux observed to the level of the mid esophagus. Unremarkable appearance of the stomach, duodenal bulb and duodenal sweep. No definite evidence of ulceration, fold thickening or mass. IMPRESSION: Somewhat limited examination due to limited patient positioning. Mildly patulous esophagus. Prominent intermittent esophageal dysmotility with tertiary contractions. A swallowed 13 mm barium tablet did not pass beyond the level of the distal esophagus, despite a prolonged period of observation, and this may reflect a mild smooth stricture at this site. Endoscopy should be considered for further evaluation. Moderate-volume gastroesophageal reflux observed to the level of the mid esophagus. Small hiatal hernia. Electronically Signed   By: Kellie Simmering DO   On: 05/13/2020 09:05   ECHOCARDIOGRAM COMPLETE  Result Date: 05/13/2020    ECHOCARDIOGRAM REPORT   Patient Name:   JOLETTE LANA [facility] Date of Exam: 05/13/2020 Medical Rec #:  [id number]       Height:  63.0 in Accession #:    [id number]      Weight:       120.4 lb Date of Birth:  10/09/1960       BSA:          1.558 m Patient Age:    78 years        BP:           94/58 mmHg Patient Gender: F               HR:           48 bpm. Exam Location:  Inpatient Procedure: 2D Echo STAT ECHO Indications:     cardiac arrest  History:         Patient has prior history of Echocardiogram examinations, most                  recent 10/30/2016. COPD; Risk  Factors:Diabetes and                  Dyslipidemia.  Sonographer:     Johny Chess Referring Phys:  [id number] [place] Diagnosing Phys: Eleonore Chiquito MD IMPRESSIONS  1. Left ventricular ejection fraction, by estimation, is 55 to 60%. The left ventricle has normal function. The left ventricle has no regional wall motion abnormalities. Left ventricular diastolic parameters are consistent with Grade I diastolic dysfunction (impaired relaxation).  2. Right ventricular systolic function is normal. The right ventricular size is normal. There is normal pulmonary artery systolic pressure. The estimated right ventricular systolic pressure is 45.8 mmHg.  3. Left atrial size was mildly dilated.  4. The mitral valve is grossly normal. Trivial mitral valve regurgitation. No evidence of mitral stenosis.  5. The aortic valve is tricuspid. Aortic valve regurgitation is not visualized. No aortic stenosis is present.  6. The inferior vena cava is normal in size with greater than 50% respiratory variability, suggesting right atrial pressure of 3 mmHg. FINDINGS  Left Ventricle: Left ventricular ejection fraction, by estimation, is 55 to 60%. The left ventricle has normal function. The left ventricle has no regional wall motion abnormalities. The left ventricular internal cavity size was normal in size. There is  no left ventricular hypertrophy. Left ventricular diastolic parameters are consistent with Grade I diastolic dysfunction (impaired relaxation). Right Ventricle: The right ventricular size is normal. No increase in right ventricular wall thickness. Right ventricular systolic function is normal. There is normal pulmonary artery systolic pressure. The tricuspid regurgitant velocity is 2.36 m/s, and  with an assumed right atrial pressure of 3 mmHg, the estimated right ventricular systolic pressure is 09.9 mmHg. Left Atrium: Left atrial size was mildly dilated. Right Atrium: Right atrial size was normal in size.  Pericardium: Trivial pericardial effusion is present. Mitral Valve: The mitral valve is grossly normal. Trivial mitral valve regurgitation. No evidence of mitral valve stenosis. Tricuspid Valve: The tricuspid valve is grossly normal. Tricuspid valve regurgitation is trivial. No evidence of tricuspid stenosis. Aortic Valve: The aortic valve is tricuspid. Aortic valve regurgitation is not visualized. No aortic stenosis is present. Pulmonic Valve: The pulmonic valve was grossly normal. Pulmonic valve regurgitation is not visualized. No evidence of pulmonic stenosis. Aorta: The aortic root is normal in size and structure. Venous: The right upper pulmonary vein is normal. The inferior vena cava is normal in size with greater than 50% respiratory variability, suggesting right atrial pressure of 3 mmHg. IAS/Shunts: The atrial septum is grossly normal.  LEFT VENTRICLE PLAX 2D LVIDd:  4.70 cm  Diastology LVIDs:         3.20 cm  LV e' medial:    4.24 cm/s LV PW:         1.10 cm  LV E/e' medial:  14.3 LV IVS:        0.90 cm  LV e' lateral:   5.55 cm/s LVOT diam:     1.90 cm  LV E/e' lateral: 11.0 LV SV:         85 LV SV Index:   54 LVOT Area:     2.84 cm  RIGHT VENTRICLE            IVC RV S prime:     8.05 cm/s  IVC diam: 1.30 cm TAPSE (M-mode): 2.1 cm LEFT ATRIUM             Index       RIGHT ATRIUM           Index LA diam:        3.90 cm 2.50 cm/m  RA Area:     10.50 cm LA Vol (A2C):   48.5 ml 31.12 ml/m RA Volume:   22.60 ml  14.50 ml/m LA Vol (A4C):   59.5 ml 38.18 ml/m LA Biplane Vol: 54.5 ml 34.97 ml/m  AORTIC VALVE LVOT Vmax:   128.00 cm/s LVOT Vmean:  77.200 cm/s LVOT VTI:    0.299 m  AORTA Ao Root diam: 2.80 cm MITRAL VALVE               TRICUSPID VALVE MV Area (PHT): 2.44 cm    TR Peak grad:   22.3 mmHg MV Decel Time: 311 msec    TR Vmax:        236.00 cm/s MV E velocity: 60.80 cm/s MV A velocity: 73.30 cm/s  SHUNTS MV E/A ratio:  0.83        Systemic VTI:  0.30 m                            Systemic  Diam: 1.90 cm Eleonore Chiquito MD Electronically signed by Eleonore Chiquito MD Signature Date/Time: 05/13/2020/2:27:11 PM    Final (Updated)     Microbiology: Recent Results (from the past 240 hour(s))  MRSA PCR Screening     Status: None   Collection Time: 05/12/20  2:40 AM   Specimen: Nasal Mucosa; Nasopharyngeal  Result Value Ref Range Status   MRSA by PCR NEGATIVE NEGATIVE Final    Comment:        The GeneXpert MRSA Assay (FDA approved for NASAL specimens only), is one component of a comprehensive MRSA colonization surveillance program. It is not intended to diagnose MRSA infection nor to guide or monitor treatment for MRSA infections. Performed at [facility], [place] [address] [place], [place] [address]   SARS CORONAVIRUS 2 (TAT 6-24 HRS) Nasopharyngeal Nasopharyngeal Swab     Status: None   Collection Time: 05/14/20 11:43 AM   Specimen: Nasopharyngeal Swab  Result Value Ref Range Status   SARS Coronavirus 2 NEGATIVE NEGATIVE Final    Comment: (NOTE) SARS-CoV-2 target nucleic acids are NOT DETECTED.  The SARS-CoV-2 RNA is generally detectable in upper and lower respiratory specimens during the acute phase of infection. Negative results do not preclude SARS-CoV-2 infection, do not rule out co-infections with other pathogens, and should not be used as the sole basis for treatment or other patient management decisions. Negative results must  be combined with clinical observations, patient history, and epidemiological information. The expected result is Negative.  Fact Sheet for Patients: SugarRoll.be  Fact Sheet for Healthcare Providers: https://www.woods-mathews.com/  This test is not yet approved or cleared by the [place] FDA and  has been authorized for detection and/or diagnosis of SARS-CoV-2 by FDA under an Emergency Use Authorization (EUA). This EUA will remain  in effect (meaning this test can be used) for the  duration of the COVID-19 declaration under Se ction 564(b)(1) of the Act, 21 U.S.C. section 360bbb-3(b)(1), unless the authorization is terminated or revoked sooner.  Performed at [facility], [place] [address], [place], [place] [address]      Labs: Basic Metabolic Panel: Recent Labs  Lab 05/11/20 1230 05/11/20 1407 05/11/20 2018 05/12/20 0709 05/13/20 0037 05/14/20 0023 05/14/20 1849 05/15/20 0515  NA 138  --   --  139 136 137  --   --   K 2.8*  --   --  3.0* 3.5 3.6  --   --   CL 111  --   --  116* 115* 114*  --   --   CO2 17*  --   --  17* 17* 18*  --   --   GLUCOSE 86  --   --  77 91 109*  --   --   BUN 28*  --   --  _0 --   --   CREATININE 3.48*  --   --  2.48* 2.06* 1.73*  --   --   CALCIUM 8.1*  --   --  7.7* 8.1* 8.2*  --   --   MG  --    < >  --  1.5* 2.1 1.7 1.7 1.6*  PHOS  --   --  3.8  --   --   --  2.1* 2.2*   < > = values in this interval not displayed.   Liver Function Tests: Recent Labs  Lab 05/11/20 1230 05/12/20 0709 05/13/20 0037 05/14/20 0023  AST _1 ALT _2 ALKPHOS 96 80 75 73  BILITOT 0.5 0.6 0.4 0.4  PROT 5.3* 4.5* 3.8* 3.8*  ALBUMIN 2.9* 2.4* 2.1* 2.1*   Recent Labs  Lab 05/11/20 1230  LIPASE 25   No results for input(s): AMMONIA in the last 168 hours. CBC: Recent Labs  Lab 05/11/20 1230 05/13/20 1230  WBC 7.0 4.0  NEUTROABS  --  2.2  HGB 10.7* 9.3*  HCT 34.6* 28.8*  MCV 89.4 87.3  PLT 371 348   Cardiac Enzymes: No results for input(s): CKTOTAL, CKMB, CKMBINDEX, TROPONINI in the last 168 hours. BNP: BNP (last 3 results) Recent Labs    02/18/20 1335  BNP 584.2*    ProBNP (last 3 results) No results for input(s): PROBNP in the last 8760 hours.  CBG: Recent Labs  Lab 05/15/20 1508 05/15/20 2005 05/15/20 2344 05/16/20 0408 05/16/20 0916  GLUCAP 105* 89 75 93 103*       Signed:  Nita Sells MD   [facility] 05/16/2020, 10:33 AM

## 2020-05-16 NOTE — Progress Notes (Addendum)
AVS reviewed with patient, all questions asked and answered. PIV removed. Patient being discharged via personal transportation of possible friend/family but refuses to share whom with. Refuses to allow health care team so share any information with family. Mom at bedside and asked to leave during DC instructions. Cleared for DC

## 2020-05-16 NOTE — Evaluation (Incomplete)
Occupational Therapy Evaluation and Discharge Patient Details Name: Tiffany [name] MRN: [id number] DOB: May 17, 1960 Today's Date: 05/16/2020    History of Present Illness 60 yo admitted 4/26 with N/V/D and AKI. PMHx: schizoaffective disorder, HTN, HLD, COPD, anemia, gout.  Had unresponsive episode and bradycardia on 4/29, concern for possible ingestion of pills and narcan given.  Patient transferred to ICU.   Clinical Impression   This 60 yo female admitted with above presents to acute OT with PLOF of reportedly doing her own basic ADLs, but has an aide for IADLs. Currently pt is setup/S-Mod A for basic ADLs with S when up on her feet with unknown D/C destination or true A due to she will not disclose this to staff. [name] to D/C today so will D/C acute OT.    Follow Up Recommendations  No OT follow up;Supervision - Intermittent    Equipment Recommendations  None recommended by OT       Precautions / Restrictions Precautions Precautions: Fall Restrictions Weight Bearing Restrictions: No      Mobility Bed Mobility               General bed mobility comments: Pt up in bathroom upon my arrival    Transfers Overall transfer level: Needs assistance Equipment used: Rolling walker (2 wheeled) Transfers: Sit to/from Stand Sit to Stand: Supervision              Balance Overall balance assessment: Needs assistance Sitting-balance support: No upper extremity supported;Feet supported Sitting balance-Leahy Scale: Good     Standing balance support: No upper extremity supported;During functional activity Standing balance-Leahy Scale: Fair Standing balance comment: standing at sink for grooming and washing front and back peri areas                           ADL either performed or assessed with clinical judgement   ADL Overall ADL's : Needs assistance/impaired Eating/Feeding: Independent;Sitting   Grooming: Supervision/safety;Standing   Upper Body Bathing:  Set up;Sitting   Lower Body Bathing: Minimal assistance Lower Body Bathing Details (indicate cue type and reason): lower legs and feet, with S sit<>stand. NT encouraged pt do wash her own legs and feet, but pt said she could not, when NT asked her who would help her with her lower legs and feet at home she stated that was none of her business Upper Body Dressing : Set up;Sitting   Lower Body Dressing: Moderate assistance Lower Body Dressing Details (indicate cue type and reason): A for starting underwear, and full A for socks and shoes Toilet Transfer: Supervision/safety;Ambulation;RW;Regular Toilet;Grab bars   Toileting- Clothing Manipulation and Hygiene: Supervision/safety                            Pertinent Vitals/Pain Pain Assessment: Faces Faces Pain Scale: Hurts little more Pain Location: left heel when touched or WB'ing (popped blister on lateral heel) Pain Descriptors / Indicators: Sore Pain Intervention(s): Monitored during session (RN in and put ointment and dressing on heel)     Hand Dominance Right   Extremity/Trunk Assessment Upper Extremity Assessment Upper Extremity Assessment: Overall WFL for tasks assessed           Communication Communication Communication: No difficulties   Cognition Arousal/Alertness: Awake/alert Behavior During Therapy: WFL for tasks assessed/performed Overall Cognitive Status: No family/caregiver present to determine baseline cognitive functioning  General Comments: Not wanting to disclose where she is going or who will be helping her              Home Living Family/patient expects to be discharged to:: Private residence Living Arrangements: Alone                               Additional Comments: says she is not going home due to being afraid of her son, but will not state where she is going      Prior Functioning/Environment Level of Independence:  Independent with assistive device(s)  Gait / Transfers Assistance Needed: ambulated with RW ADL's / Homemaking Assistance Needed: pt reports I with ADLs/sponge bathes, has aide daily for 4.5 hrs to assist with IADLs cooking, cleaning   Comments: states using a walker at baseline        OT Problem List: Decreased strength;Decreased range of motion;Impaired balance (sitting and/or standing);Decreased cognition         OT Goals(Current goals can be found in the care plan section) Acute Rehab OT Goals Patient Stated Goal: leave today                AM-PAC OT "6 Clicks" Daily Activity     Outcome Measure Help from another person eating meals?: None Help from another person taking care of personal grooming?: A Little Help from another person toileting, which includes using toliet, bedpan, or urinal?: A Little Help from another person bathing (including washing, rinsing, drying)?: A Little Help from another person to put on and taking off regular upper body clothing?: A Little Help from another person to put on and taking off regular lower body clothing?: A Lot 6 Click Score: 18   End of Session Equipment Utilized During Treatment: Rolling walker  Activity Tolerance: Patient tolerated treatment well Patient left:  (sitting EOB with NT in room)  OT Visit Diagnosis: Unsteadiness on feet (R26.81);Other abnormalities of gait and mobility (R26.89);Pain;Muscle weakness (generalized) (M62.81) Pain - Right/Left: Left Pain - part of body:  (heel)                Time: 5056-9794 OT Time Calculation (min): 37 min Charges:  OT General Charges $OT Visit: 1 Visit OT Evaluation $OT Eval Moderate Complexity: 1 Mod OT Treatments $Self Care/Home Management : 8-22 mins  [address], OTR/L Acute [employer] Pager [phone] Office [phone]     Almon Register 05/16/2020, 4:09 PM

## 2020-05-16 NOTE — TOC Progression Note (Signed)
Transition of Care [facility]) - Progression Note    Patient Details  Name: MACALA BALDONADO MRN: [id number] Date of Birth: [birth year]  Transition of Care [facility] CM/SW [place] Phone Number: 05/16/2020, 12:36 PM  Clinical Narrative:    [name] spoke with pt about dc plans, pt states that she doesn't want to go home because her son is on drugs and he hides in her attic. Pt states that she has contacted law enforcement as well as the [facility] numerous times but have been unsuccessful at receiving any assistance. [name] reminded pt that she could contact law enforcement if she thought her son was in the home, pt states the police do nothing. [name] also spoke with pt about taking out a 50b, pt states her son won't care about a 50b. Pt states she will not be going home and instead be going to another location and declined to tell [name]. [name] asked pt if she needs transportation, pt declined. Pt stated she didn't want to have [name] follow up with any family members about her dc.         Expected Discharge Plan and Services           Expected Discharge Date: 05/16/20                                     Social Determinants of Health (SDOH) Interventions    Readmission Risk Interventions Readmission Risk Prevention Plan 02/23/2020  Transportation Screening Complete  Medication Review [employer]) Complete  PCP or Specialist appointment within 3-5 days of discharge Complete  HRI or Home Care Consult Complete  SW Recovery Care/Counseling Consult Complete  Palliative Care Screening Complete  Skilled Nursing Facility Complete  Some recent data might be hidden

## 2020-05-17 ENCOUNTER — Telehealth: Payer: Self-pay

## 2020-05-17 ENCOUNTER — Encounter (HOSPITAL_COMMUNITY): Payer: Self-pay

## 2020-05-17 NOTE — Telephone Encounter (Signed)
Transition Care Management Unsuccessful Follow-up Telephone Call  Date of discharge and from where:  05/16/2020, [facility]  Attempts:  1st Attempt  Reason for unsuccessful TCM follow-up call:  Left voice message on # [phone]. Call back requested to this CM. Call placed to # [phone] and the  Phone just rings. No option for voicemail.  Patient has appointment at [facility] with Dr Margarita Rana 05/31/2020.

## 2020-05-18 ENCOUNTER — Telehealth: Payer: Self-pay

## 2020-05-18 ENCOUNTER — Other Ambulatory Visit: Payer: Self-pay | Admitting: Family Medicine

## 2020-05-18 NOTE — Telephone Encounter (Signed)
Transition Care Management Unsuccessful Follow-up Telephone Call  Date of discharge and from where:  05/16/2020, [facility]  Attempts:  2nd Attempt  Reason for unsuccessful TCM follow-up call:  Left voice message on # [phone]. Call also placed to # [phone] and the phone just rings, no option for voicemail.   Patient has appointment with Dr Margarita Rana 05/31/2020.

## 2020-05-19 ENCOUNTER — Telehealth: Payer: Self-pay

## 2020-05-19 ENCOUNTER — Other Ambulatory Visit: Payer: Self-pay

## 2020-05-19 ENCOUNTER — Ambulatory Visit (HOSPITAL_COMMUNITY): Payer: 59 | Admitting: Psychiatry

## 2020-05-19 LAB — VITAMIN C: Vitamin C: 0.2 mg/dL — ABNORMAL LOW (ref 0.4–2.0)

## 2020-05-19 NOTE — Telephone Encounter (Signed)
Transition Care Management Unsuccessful Follow-up Telephone Call  Date of discharge and from where:  05/16/2020, [facility]  Attempts:  3rd Attempt  Reason for unsuccessful TCM follow-up call:  Left voice message on # [phone]. Call also placed to # [phone] and the phone just rings, no option for voicemail.   Patient has appointment with Dr Margarita Rana 05/31/2020.

## 2020-05-20 ENCOUNTER — Other Ambulatory Visit (HOSPITAL_COMMUNITY): Payer: Self-pay | Admitting: *Deleted

## 2020-05-20 ENCOUNTER — Ambulatory Visit: Payer: Self-pay | Admitting: *Deleted

## 2020-05-20 MED ORDER — TOPIRAMATE 25 MG PO TABS: 25.0000 mg | ORAL_TABLET | Freq: Two times a day (BID) | ORAL | 0 refills | Status: DC

## 2020-05-20 MED ORDER — ESCITALOPRAM OXALATE 10 MG PO TABS: 10.0000 mg | ORAL_TABLET | Freq: Every day | ORAL | 2 refills | Status: DC

## 2020-05-20 MED ORDER — ASENAPINE MALEATE 2.5 MG SL SUBL: 2.5000 mg | SUBLINGUAL_TABLET | Freq: Two times a day (BID) | SUBLINGUAL | 0 refills | Status: DC

## 2020-05-20 MED ORDER — ASENAPINE MALEATE 5 MG SL SUBL: 5.0000 mg | SUBLINGUAL_TABLET | Freq: Two times a day (BID) | SUBLINGUAL | 0 refills | Status: DC

## 2020-05-20 NOTE — Telephone Encounter (Signed)
Pt has an appointment to discuss matter.

## 2020-05-20 NOTE — Telephone Encounter (Signed)
Patient called office because she was having trouble with her breathing and she is having increased swelling in her legs/feet. Patient was very upset at beginning of call - but during call she was able to locate her inhaler and that helped greatly with her breathing. Patient states she needs a fluid medication for her leg swelling - advised she would need to be evaluated and appointment was scheduled.  Reason for Disposition . [1] Longstanding difficulty breathing AND [2] not responding to usual therapy . [1] MODERATE leg swelling (e.g., swelling extends up to knees) AND [2] new-onset or worsening  Answer Assessment - Initial Assessment Questions 1. RESPIRATORY STATUS: "Describe your breathing?" (e.g., wheezing, shortness of breath, unable to speak, severe coughing)      SOB 2. ONSET: "When did this breathing problem begin?"     Chronic problem- O2 sat 98%, HR-105 3. PATTERN "Does the difficult breathing come and go, or has it been constant since it started?"     Comes and  goes 4. SEVERITY: "How bad is your breathing?" (e.g., mild, moderate, severe)    - MILD: No SOB at rest, mild SOB with walking, speaks normally in sentences, can lay down, no retractions, pulse < 100.    - MODERATE: SOB at rest, SOB with minimal exertion and prefers to sit, cannot lie down flat, speaks in phrases, mild retractions, audible wheezing, pulse 100-120.    - SEVERE: Very SOB at rest, speaks in single words, struggling to breathe, sitting hunched forward, retractions, pulse > 120      Mild/moderate 5. RECURRENT SYMPTOM: "Have you had difficulty breathing before?" If Yes, ask: "When was the last time?" and "What happened that time?"      Yes- chronic breathing problem 6. CARDIAC HISTORY: "Do you have any history of heart disease?" (e.g., heart attack, angina, bypass surgery, angioplasty)      Hx heart disease 7. LUNG HISTORY: "Do you have any history of lung disease?"  (e.g., pulmonary embolus, asthma, emphysema)      COPD 8. CAUSE: "What do you think is causing the breathing problem?"      Can not find her inhaler to help with her breathing 9. OTHER SYMPTOMS: "Do you have any other symptoms? (e.g., dizziness, runny nose, cough, chest pain, fever)     Swelling in legs- patient was up and moving a lot yesterday 10. PREGNANCY: "Is there any chance you are pregnant?" "When was your last menstrual period?"       n/a 11. TRAVEL: "Have you traveled out of the country in the last month?" (e.g., travel history, exposures)       no  Answer Assessment - Initial Assessment Questions 1. ONSET: "When did the swelling start?" (e.g., minutes, hours, days)     *No Answer* 2. LOCATION: "What part of the leg is swollen?"  "Are both legs swollen or just one leg?"     Bilateral leg swelling 3. SEVERITY: "How bad is the swelling?" (e.g., localized; mild, moderate, severe)  - Localized - small area of swelling localized to one leg  - MILD pedal edema - swelling limited to foot and ankle, pitting edema < 1/4 inch (6 mm) deep, rest and elevation eliminate most or all swelling  - MODERATE edema - swelling of lower leg to knee, pitting edema > 1/4 inch (6 mm) deep, rest and elevation only partially reduce swelling  - SEVERE edema - swelling extends above knee, facial or hand swelling present      Moderate/severe 4. REDNESS: "  Does the swelling look red or infected?"     no 5. PAIN: "Is the swelling painful to touch?" If Yes, ask: "How painful is it?"   (Scale 1-10; mild, moderate or severe)    No- puffy and can't feel toes 6. FEVER: "Do you have a fever?" If Yes, ask: "What is it, how was it measured, and when did it start?"     no 7. CAUSE: "What do you think is causing the leg swelling?"     Patient states she needs a fluid pill- she has been up and moving for the past 2 days 8. MEDICAL HISTORY: "Do you have a history of heart failure, kidney disease, liver failure, or cancer?"     Heart disease  9. RECURRENT SYMPTOM:  "Have you had leg swelling before?" If Yes, ask: "When was the last time?" "What happened that time?"     Yes- patient was given medication to decrease fliud 10. OTHER SYMPTOMS: "Do you have any other symptoms?" (e.g., chest pain, difficulty breathing)       SOB- patient was able to find her inhaler and was breathing better with use. 11. PREGNANCY: "Is there any chance you are pregnant?" "When was your last menstrual period?"       n/a  Protocols used: BREATHING DIFFICULTY-A-AH, LEG SWELLING AND EDEMA-A-AH

## 2020-05-20 NOTE — Telephone Encounter (Signed)
Patient phone number was updated to preferred number. Noted that patient has been attempted on both numbers already. Please follow up.

## 2020-05-20 NOTE — Telephone Encounter (Signed)
Pt is calling and had to get new phone number please call pt on [phone]. Pt was sent to NT due to sob

## 2020-05-21 ENCOUNTER — Other Ambulatory Visit: Payer: Self-pay

## 2020-05-21 ENCOUNTER — Telehealth: Payer: Self-pay | Admitting: Family Medicine

## 2020-05-21 ENCOUNTER — Ambulatory Visit: Payer: 59 | Attending: Internal Medicine | Admitting: Internal Medicine

## 2020-05-21 ENCOUNTER — Encounter: Payer: Self-pay | Admitting: Internal Medicine

## 2020-05-21 VITALS — BP 114/76 | HR 113 | Resp 20

## 2020-05-21 DIAGNOSIS — R600 Localized edema: Secondary | ICD-10-CM | POA: Diagnosis not present

## 2020-05-21 DIAGNOSIS — L97422 Non-pressure chronic ulcer of left heel and midfoot with fat layer exposed: Secondary | ICD-10-CM

## 2020-05-21 DIAGNOSIS — T7411XA Adult physical abuse, confirmed, initial encounter: Secondary | ICD-10-CM | POA: Diagnosis not present

## 2020-05-21 DIAGNOSIS — F250 Schizoaffective disorder, bipolar type: Secondary | ICD-10-CM

## 2020-05-21 MED ORDER — FUROSEMIDE 20 MG PO TABS: 20.0000 mg | ORAL_TABLET | Freq: Every day | ORAL | 0 refills | Status: DC

## 2020-05-21 MED ORDER — OXYBUTYNIN CHLORIDE 5 MG PO TABS: 5.0000 mg | ORAL_TABLET | Freq: Two times a day (BID) | ORAL | 1 refills | Status: DC

## 2020-05-21 MED ORDER — DICLOFENAC SODIUM 1 % EX GEL: 4.0000 g | Freq: Four times a day (QID) | CUTANEOUS | 0 refills | Status: DC
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
## 2020-05-21 NOTE — Telephone Encounter (Signed)
Pt would like housing assistance & would like to be seen sooner than June 14th for the fluid on her chest. I made pt aware that she has an appt on the 16th. Please follow up regarding issue.

## 2020-05-21 NOTE — Patient Instructions (Signed)
I have placed you on a low-dose of a fluid pill called furosemide for you to take daily for the next 1 week to help decrease the swelling in your legs.  Try to keep the legs elevated and limit the salt in the foods.  Continue to do dressing changes to the ulcer on your heel daily.  I have referred you to wound care to assist with management.

## 2020-05-22 ENCOUNTER — Other Ambulatory Visit: Payer: Self-pay | Admitting: Family Medicine

## 2020-05-22 DIAGNOSIS — K219 Gastro-esophageal reflux disease without esophagitis: Secondary | ICD-10-CM

## 2020-05-22 NOTE — Telephone Encounter (Signed)
Requested Prescriptions  Pending Prescriptions Disp Refills  . tiZANidine (ZANAFLEX) 4 MG tablet [Pharmacy Med Name: TIZANIDINE 4MG  TABLETS] 60 tablet     Sig: TAKE 1 TABLET(4 MG) BY MOUTH EVERY 8 HOURS AS NEEDED FOR MUSCLE SPASMS     Not Delegated - Cardiovascular:  Alpha-2 Agonists - tizanidine Failed - 05/22/2020 11:30 AM      Failed - This refill cannot be delegated      Passed - Valid encounter within last 6 months    Recent Outpatient Visits          Yesterday Edema of both legs   [place] Ladell Pier, MD   1 month ago Pelvic mass in female   [place], Enobong, MD   4 months ago Overactive bladder   [place], MD   1 year ago Refused assessment of physical health   [facility], Delfino Lovett, NP   1 year ago Chronic pain syndrome   [facility], Delfino Lovett, NP      Future Appointments            In 1 week [name], MD [place]   In 1 month [name], MD [place]           . omeprazole (PRILOSEC) 40 MG capsule [Pharmacy Med Name: OMEPRAZOLE 40MG  CAPSULES] 90 capsule     Sig: TAKE 1 CAPSULE(40 MG) BY MOUTH DAILY     Gastroenterology: Proton Pump Inhibitors Passed - 05/22/2020 11:30 AM      Passed - Valid encounter within last 12 months    Recent Outpatient Visits          Yesterday Edema of both legs   [place] Ladell Pier, MD   1 month ago Pelvic mass in female   [place], Enobong, MD   4 months ago Overactive bladder   [place], MD   1 year ago Refused assessment of physical health   [facility], Delfino Lovett, NP   1 year ago Chronic pain syndrome   [facility], Delfino Lovett, NP       Future Appointments            In 1 week [name], MD [place]   In 1 month [name], MD [place]

## 2020-05-22 NOTE — Telephone Encounter (Signed)
Requested medication (s) are due for refill today: no  Requested medication (s) are on the active medication list: no  Last refill:  01/01/20  Future visit scheduled: yes  Notes to clinic:  was dc'd 02/26/20 stop taking at discharge   Requested Prescriptions  Pending Prescriptions Disp Refills   tiZANidine (ZANAFLEX) 4 MG tablet [Pharmacy Med Name: TIZANIDINE 4MG  TABLETS] 60 tablet     Sig: TAKE 1 TABLET(4 MG) BY MOUTH EVERY 8 HOURS AS NEEDED FOR MUSCLE SPASMS      Not Delegated - Cardiovascular:  Alpha-2 Agonists - tizanidine Failed - 05/22/2020 11:30 AM      Failed - This refill cannot be delegated      Passed - Valid encounter within last 6 months    Recent Outpatient Visits           Yesterday Edema of both legs   [place], Deborah B, MD   1 month ago Pelvic mass in female   [place], Enobong, MD   4 months ago Overactive bladder   [place], MD   1 year ago Refused assessment of physical health   [facility], Delfino Lovett, NP   1 year ago Chronic pain syndrome   [facility], Delfino Lovett, NP       Future Appointments             In 1 week [name], MD [place]   In 1 month [name], MD [place]   omeprazole (PRILOSEC) 40 MG capsule [Pharmacy Med Name: OMEPRAZOLE 40MG  CAPSULES] 90 capsule     Sig: TAKE 1 CAPSULE(40 MG) BY MOUTH DAILY      Gastroenterology: Proton Pump Inhibitors Passed - 05/22/2020 11:30 AM      Passed - Valid encounter within last 12 months    Recent Outpatient Visits           Yesterday Edema of both legs   [place] Ladell Pier, MD   1 month ago Pelvic mass in female   [place],  Enobong, MD   4 months ago Overactive bladder   [place], MD   1 year ago Refused assessment of physical health   [facility], Delfino Lovett, NP   1 year ago Chronic pain syndrome   [facility], Delfino Lovett, NP       Future Appointments             In 1 week [name], MD [place]   In 1 month [name], MD [place]

## 2020-05-24 ENCOUNTER — Telehealth: Payer: Self-pay | Admitting: Family Medicine

## 2020-05-24 ENCOUNTER — Telehealth: Payer: Self-pay

## 2020-05-24 ENCOUNTER — Ambulatory Visit (INDEPENDENT_AMBULATORY_CARE_PROVIDER_SITE_OTHER): Payer: 59 | Admitting: Psychiatry

## 2020-05-24 ENCOUNTER — Other Ambulatory Visit: Payer: Self-pay

## 2020-05-24 ENCOUNTER — Encounter (HOSPITAL_COMMUNITY): Payer: Self-pay | Admitting: Psychiatry

## 2020-05-24 DIAGNOSIS — F3341 Major depressive disorder, recurrent, in partial remission: Secondary | ICD-10-CM | POA: Diagnosis not present

## 2020-05-24 DIAGNOSIS — F250 Schizoaffective disorder, bipolar type: Secondary | ICD-10-CM

## 2020-05-24 DIAGNOSIS — R600 Localized edema: Secondary | ICD-10-CM

## 2020-05-24 LAB — VITAMIN A: Vitamin A (Retinoic Acid): 40.2 ug/dL (ref 22.0–69.5)
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
## 2020-05-24 NOTE — Telephone Encounter (Signed)
At request of Dr Wynetta Emery, attempted to contact the patient to discuss her living situation and home safety.  Call placed to # [phone] and the phone just rings busy. Call placed to # [phone] and the recording stated that the call could not be completed at this time

## 2020-05-24 NOTE — Telephone Encounter (Signed)
Unable to reach patient.   Please advise diclofenac tab.

## 2020-05-24 NOTE — Telephone Encounter (Signed)
Pt states the 7 pills of furosemide (LASIX) 20 MG tablet Did not do a thing for her.  She still has swelling. Just like on Friday when shewas in office. Pt has been elevating legs, and that has not helped. Pt states she also wanted the diclofenac TABS Pt states the gel does not work. she told the dr that. Pt states she is in pain and needs her medication. Pt started yelling and states she is in pain.  She does not want to wait 2-3 days and needs today.  She does not want to wait until next Monday when she sees her pcp. Please advise  [facility] [phone] - [place], [place]  Pt wants to know how many hours the dr gave her for aid.

## 2020-05-24 NOTE — Telephone Encounter (Signed)
Copied from [place] [phone]. Topic: General - Other >> May 24, 2020 12:03 PM Alanda Slim E wrote: Reason for CRM: Pt would like to speak with nurse asap / pt still has leg swelling and would like to speak with nurse about medication/ please advise

## 2020-05-24 NOTE — Telephone Encounter (Signed)
Attempt to call patient.  Unable to reach.  Busy signal at 1st contact and no answer at the other.

## 2020-05-25 ENCOUNTER — Other Ambulatory Visit: Payer: Self-pay | Admitting: Internal Medicine

## 2020-05-25 ENCOUNTER — Other Ambulatory Visit: Payer: Self-pay | Admitting: Family Medicine

## 2020-05-25 MED ORDER — FUROSEMIDE 20 MG PO TABS: 20.0000 mg | ORAL_TABLET | Freq: Every day | ORAL | 0 refills | Status: DC

## 2020-05-25 MED ORDER — TIZANIDINE HCL 4 MG PO TABS: 4.0000 mg | ORAL_TABLET | Freq: Three times a day (TID) | ORAL | 0 refills | Status: DC | PRN

## 2020-05-25 NOTE — Telephone Encounter (Signed)
I have sent prescriptions for additional fluid pills and medication for pain to her pharmacy.  Unfortunately I cannot give her diclofenac because of her chronic kidney disease as this can push her towards dialysis.

## 2020-05-25 NOTE — Telephone Encounter (Signed)
Informed patient of message per Dr. Margarita Rana.   Patient states the tizanidine does not help.  She states the Diclofenac is the only medication that will help. She states the diclofenac will effect her kidneys if she takes the medication more than twice daily. Per patient, her kidneys have not gotten worse and would like this medication sent to pharmacy.   Patient was informed that this request may not be satisfied since Dr. Margarita Rana wants to prevent further damage to her kidneys.

## 2020-05-25 NOTE — Telephone Encounter (Signed)
  Notes to clinic: Requesting a 90 day supply    Requested Prescriptions  Pending Prescriptions Disp Refills   oxybutynin (DITROPAN) 5 MG tablet [Pharmacy Med Name: OXYBUTYNIN 5MG  TABLETS] 180 tablet     Sig: TAKE 1 TABLET(5 MG) BY MOUTH TWICE DAILY      Urology:  Bladder Agents Passed - 05/25/2020  8:53 AM      Passed - Valid encounter within last 12 months    Recent Outpatient Visits           4 days ago Edema of both legs   [place] Ladell Pier, MD   1 month ago Pelvic mass in female   [place], Enobong, MD   4 months ago Overactive bladder   [place], MD   1 year ago Refused assessment of physical health   [facility], Delfino Lovett, NP   1 year ago Chronic pain syndrome   [facility], Delfino Lovett, NP       Future Appointments             In 6 days [name], MD [place]   In 1 month [name], MD [place]

## 2020-05-26 ENCOUNTER — Telehealth: Payer: Self-pay

## 2020-05-26 ENCOUNTER — Telehealth: Payer: Self-pay | Admitting: Physical Medicine and Rehabilitation

## 2020-05-26 NOTE — Telephone Encounter (Signed)
Patient called in to speak to someone Dr Wynetta Emery or her nurse  about the hours that she is awarded for Tiffany [name] say that she will be home after 3 Pm today. Please call Ph#  [phone]

## 2020-05-26 NOTE — Telephone Encounter (Signed)
Reached out to the patient following up on a request on information regarding [place]. Called 2x's was unable to LVM.

## 2020-05-26 NOTE — Telephone Encounter (Signed)
Following up on a faxed request from provider to [facility] spoke with Milon Dikes, who confirmed the request was received by fax on 05/07/2020 and the assessment was completed on 05/24/2020.

## 2020-05-26 NOTE — Telephone Encounter (Signed)
Patient states that the Diclofenac gel does not work. I advised that she ask her PCP about prescribing something else. She states that [place] will not prescribe anything for pain because she is seeing you and they want you to handle any medications for her pain. She states that she was taking something for pain before she started seeing you, but now she cannot get anyone to prescribe anything. She wants to know if there is someone you could refer her to that could help her with medications.  She states that the injection helped some, but less that 50%. It "still didn't get me walking" and she is still having pain in her back and legs.

## 2020-05-26 NOTE — Telephone Encounter (Signed)
Left L4-5 IL 03/10/20. Ok to repeat if helped, same problem/side, and no new injury? Patient also states she is out of Diclofenac. Voltaren gel prescribed 5/6 by Dr. Wynetta Emery. Please advise.

## 2020-05-26 NOTE — Telephone Encounter (Signed)
Patient called needing an appointment with Dr Ernestina Patches for her back. Patient said she is out of the Diclofenac. The number to contact patient is [phone]

## 2020-05-27 ENCOUNTER — Telehealth: Payer: Self-pay

## 2020-05-27 NOTE — Telephone Encounter (Signed)
Call transferred from [facility]. Verified patients information. Patient shared [facility] denied her service due to the amount of hours requested. CM shared with the patient the providers request was for an increase in hours and the discussion during the doctor's visit on 05/31/20 would be beneficial for clarification in the [facility] request.

## 2020-05-27 NOTE — Telephone Encounter (Signed)
Pt called and asked about a fax from [facility] and asked about some information pertaining to her home health hrs/ please advise

## 2020-05-27 NOTE — Telephone Encounter (Addendum)
Update: Information sent to [facility] on 05/26/20 from [facility] was faxed back to [facility] on 05/27/20.    Contacted [facility] following up on a request from [facility] for [employer]. Glass blower/designer and office staff shared information requesting more details from the provider. [facility] shared the information was faxed to [facility] fax number on 05/26/20.

## 2020-05-27 NOTE — Telephone Encounter (Signed)
The request has been followed up on. Thank you.

## 2020-05-27 NOTE — Telephone Encounter (Signed)
She can follow up with Dr. Junius Roads to discuss medication management and he can make appropriate referral. [facility] can also make referral. If injection not helpful then likely repeat would not help.

## 2020-05-27 NOTE — Telephone Encounter (Signed)
Called patient and scheduled appointment with Dr. Junius Roads.

## 2020-05-27 NOTE — Telephone Encounter (Deleted)
Update: Information sent to [facility] on 05/26/20 from [facility] was faxed back to [facility] on 05/27/20.

## 2020-05-28 ENCOUNTER — Telehealth: Payer: Self-pay | Admitting: Family Medicine

## 2020-05-28 ENCOUNTER — Other Ambulatory Visit: Payer: Self-pay | Admitting: Family Medicine

## 2020-05-28 ENCOUNTER — Telehealth: Payer: Self-pay

## 2020-05-28 DIAGNOSIS — R600 Localized edema: Secondary | ICD-10-CM

## 2020-05-28 MED ORDER — FUROSEMIDE 20 MG PO TABS: 20.0000 mg | ORAL_TABLET | Freq: Every day | ORAL | 0 refills | Status: DC

## 2020-05-28 NOTE — Telephone Encounter (Signed)
Matter will be discussed at upcoming office visit.

## 2020-05-28 NOTE — Telephone Encounter (Signed)
See encounter

## 2020-05-28 NOTE — Telephone Encounter (Signed)
Medication: furosemide (LASIX) 20 MG tablet [[id number]] -Pt states that the pharmacy did not receive this pharmacy. Can this be resent please?  Has the patient contacted their pharmacy?  (Agent: If no, request that the patient contact the pharmacy for the refill.) (Agent: If yes, when and what did the pharmacy advise?)  Preferred Pharmacy (with phone number or street name): [facility], [place] - [address] [address] Phone: [phone] Fax: [phone] Hours: Not open 24 hours    Agent: Please be advised that RX refills may take up to 3 business days. We ask that you follow-up with your pharmacy.

## 2020-05-28 NOTE — Telephone Encounter (Signed)
Patient would like the nurse to call her regarding her health aid. She stated she does not know how to contact her aid and needs the number. Please advise and call patient to discuss at [phone]

## 2020-05-28 NOTE — Telephone Encounter (Signed)
Pt is calling to request a referral to ob/gyn pt states that she has a cyst in her uterus. Pt has appt on Monday with Dr. Margarita Rana

## 2020-05-29 ENCOUNTER — Other Ambulatory Visit: Payer: Self-pay | Admitting: Family Medicine

## 2020-05-29 DIAGNOSIS — K219 Gastro-esophageal reflux disease without esophagitis: Secondary | ICD-10-CM

## 2020-05-31 ENCOUNTER — Ambulatory Visit: Payer: 59 | Attending: Family Medicine | Admitting: Family Medicine

## 2020-05-31 ENCOUNTER — Telehealth (INDEPENDENT_AMBULATORY_CARE_PROVIDER_SITE_OTHER): Payer: 59 | Admitting: Psychiatry

## 2020-05-31 ENCOUNTER — Encounter (HOSPITAL_COMMUNITY): Payer: Self-pay | Admitting: Psychiatry

## 2020-05-31 ENCOUNTER — Telehealth: Payer: Self-pay

## 2020-05-31 ENCOUNTER — Other Ambulatory Visit: Payer: Self-pay | Admitting: Family Medicine

## 2020-05-31 ENCOUNTER — Encounter: Payer: Self-pay | Admitting: Family Medicine

## 2020-05-31 ENCOUNTER — Other Ambulatory Visit: Payer: Self-pay

## 2020-05-31 VITALS — BP 103/66 | HR 67

## 2020-05-31 DIAGNOSIS — N1832 Chronic kidney disease, stage 3b: Secondary | ICD-10-CM | POA: Diagnosis not present

## 2020-05-31 DIAGNOSIS — R600 Localized edema: Secondary | ICD-10-CM

## 2020-05-31 DIAGNOSIS — F250 Schizoaffective disorder, bipolar type: Secondary | ICD-10-CM

## 2020-05-31 DIAGNOSIS — L97422 Non-pressure chronic ulcer of left heel and midfoot with fat layer exposed: Secondary | ICD-10-CM | POA: Diagnosis not present

## 2020-05-31 DIAGNOSIS — R296 Repeated falls: Secondary | ICD-10-CM | POA: Diagnosis not present

## 2020-05-31 DIAGNOSIS — K219 Gastro-esophageal reflux disease without esophagitis: Secondary | ICD-10-CM

## 2020-05-31 DIAGNOSIS — M170 Bilateral primary osteoarthritis of knee: Secondary | ICD-10-CM

## 2020-05-31 MED ORDER — POTASSIUM CHLORIDE CRYS ER 10 MEQ PO TBCR: 10.0000 meq | EXTENDED_RELEASE_TABLET | Freq: Every day | ORAL | 1 refills | Status: DC

## 2020-05-31 MED ORDER — ASENAPINE MALEATE 5 MG SL SUBL: 10.0000 mg | SUBLINGUAL_TABLET | Freq: Two times a day (BID) | SUBLINGUAL | 1 refills | Status: DC

## 2020-05-31 MED ORDER — MONTELUKAST SODIUM 10 MG PO TABS: 10.0000 mg | ORAL_TABLET | Freq: Every day | ORAL | 1 refills | Status: DC

## 2020-05-31 MED ORDER — DIAZEPAM 10 MG PO TABS: 10.0000 mg | ORAL_TABLET | Freq: Three times a day (TID) | ORAL | 1 refills | Status: AC | PRN

## 2020-05-31 MED ORDER — MISC. DEVICES MISC: 0 refills | Status: DC

## 2020-05-31 MED ORDER — FUROSEMIDE 20 MG PO TABS: 20.0000 mg | ORAL_TABLET | Freq: Every day | ORAL | 1 refills | Status: DC

## 2020-05-31 MED ORDER — OMEPRAZOLE 40 MG PO CPDR: 40.0000 mg | DELAYED_RELEASE_CAPSULE | Freq: Every day | ORAL | 1 refills | Status: DC

## 2020-05-31 MED ORDER — SILVER SULFADIAZINE 1 % EX CREA: 1.0000 "application " | TOPICAL_CREAM | Freq: Two times a day (BID) | CUTANEOUS | 1 refills | Status: DC
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
## 2020-05-31 NOTE — Telephone Encounter (Signed)
Patient was given the information for [facility] to contact them directly for her needs.
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
## 2020-05-31 NOTE — Progress Notes (Signed)
Power wheelchair evaluation.
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
## 2020-05-31 NOTE — Telephone Encounter (Signed)
}  Notes to clinic:  requesting a 90 day supply    Requested Prescriptions  Pending Prescriptions Disp Refills   potassium chloride (KLOR-CON) 10 MEQ tablet [Pharmacy Med Name: POTASSIUM CL MICRO 10MEQ ER TABS] 90 tablet     Sig: TAKE 1 TABLET(10 MEQ) BY MOUTH DAILY      Endocrinology:  Minerals - Potassium Supplementation Failed - 05/31/2020 12:54 PM      Failed - Cr in normal range and within 360 days    Creat  Date Value Ref Range Status  02/18/2016 1.22 (H) 0.50 - 1.05 mg/dL Final    Comment:      For patients > or = 61 years of age: The upper reference limit for Creatinine is approximately 13% higher for people identified as African-American.      Creatinine, Ser  Date Value Ref Range Status  05/14/2020 1.73 (H) 0.44 - 1.00 mg/dL Final   Creatinine,U  Date Value Ref Range Status  06/17/2012 521.94 mg/dL Final    Comment:    Result confirmed by automatic dilution.    Cutoff Values for Urine Drug Screen, Pain Mgmt          Drug Class           Cutoff (ng/mL)          Amphetamines             500          Barbiturates             200          Cocaine Metabolites      150          Benzodiazepines          200          Methadone                300          Opiates                  300          Phencyclidine             25          Propoxyphene             300          Marijuana Metabolites     50    For medical purposes only.   Creatinine, Urine  Date Value Ref Range Status  02/19/2020 300.84 mg/dL Final    Comment:    Performed at [facility], [place] [address] [place], [place] [address]          Passed - K in normal range and within 360 days    Potassium  Date Value Ref Range Status  05/14/2020 3.6 3.5 - 5.1 mmol/L Final          Passed - Valid encounter within last 12 months    Recent Outpatient Visits           Today Heel ulcer, left, with fat layer exposed [facility])   [place] [name], MD    1 week ago Edema of both legs   [place], MD   2 months ago Pelvic mass in female   [place], Enobong, MD   5 months ago Overactive bladder   [facility]  [place] [name], MD   1 year ago Refused assessment of physical health   [facility], Delfino Lovett, NP       Future Appointments             In 2 weeks [place], Elza Rafter, DO Wound Care and [place], [facility]   In 4 weeks [name], MD [place]               omeprazole (PRILOSEC) 40 MG capsule [Pharmacy Med Name: OMEPRAZOLE 40MG  CAPSULES] 90 capsule     Sig: TAKE 1 CAPSULE(40 MG) BY MOUTH DAILY      Gastroenterology: Proton Pump Inhibitors Passed - 05/31/2020 12:54 PM      Passed - Valid encounter within last 12 months    Recent Outpatient Visits           Today Heel ulcer, left, with fat layer exposed ([place])   [place], Charlane Ferretti, MD   1 week ago Edema of both legs   [place], Deborah B, MD   2 months ago Pelvic mass in female   [place], Enobong, MD   5 months ago Overactive bladder   [place], Enobong, MD   1 year ago Refused assessment of physical health   [facility], Delfino Lovett, NP       Future Appointments             In 2 weeks [place], Elza Rafter, DO Wound Care and [place], [facility]   In 4 weeks [name], MD [place]               montelukast (SINGULAIR) 10 MG tablet [Pharmacy Med Name: MONTELUKAST 10MG  TABLETS] 90 tablet     Sig: TAKE 1 TABLET(10 MG) BY MOUTH AT BEDTIME      Pulmonology:  Leukotriene Inhibitors Passed - 05/31/2020 12:54 PM      Passed - Valid encounter within last 12 months     Recent Outpatient Visits           Today Heel ulcer, left, with fat layer exposed ([place])   [place], Enobong, MD   1 week ago Edema of both legs   [place], Deborah B, MD   2 months ago Pelvic mass in female   [place], Enobong, MD   5 months ago Overactive bladder   [place], MD   1 year ago Refused assessment of physical health   [facility], Delfino Lovett, NP       Future Appointments             In 2 weeks [place], Elza Rafter, DO [place] and [place], [facility]   In 4 weeks [name], MD [place]

## 2020-06-01 ENCOUNTER — Ambulatory Visit (INDEPENDENT_AMBULATORY_CARE_PROVIDER_SITE_OTHER): Payer: 59 | Admitting: Family Medicine

## 2020-06-01 ENCOUNTER — Encounter: Payer: Self-pay | Admitting: Family Medicine

## 2020-06-01 DIAGNOSIS — M5442 Lumbago with sciatica, left side: Secondary | ICD-10-CM | POA: Diagnosis not present

## 2020-06-01 DIAGNOSIS — M5441 Lumbago with sciatica, right side: Secondary | ICD-10-CM

## 2020-06-01 DIAGNOSIS — G8929 Other chronic pain: Secondary | ICD-10-CM

## 2020-06-01 MED ORDER — DICLOFENAC SODIUM 75 MG PO TBEC: 75.0000 mg | DELAYED_RELEASE_TABLET | Freq: Two times a day (BID) | ORAL | 0 refills | Status: DC | PRN

## 2020-06-01 NOTE — Progress Notes (Signed)
Office Visit Note   Patient: Tiffany [name]           Date of Birth: 1961/01/12           MRN: [id number] Visit Date: 06/01/2020 Requested by: [name], MD [place],  [place] [address] PCP: [name], MD  Subjective: Chief Complaint  Patient presents with  . Lower Back - Follow-up, Pain    Pain is not as bad as before, but continues to hurt. She would like to be referred for another ESI.     HPI: [name] here for follow-up chronic low back pain.  She states that the epidural injection did give her some relief, she feels somewhat better.  She states that her daughter in [place] recently had a baby, and patient is very excited about being a grandmother.  She wants to feel better so that she can 1 day see her granddaughter.  She states that she has taken diclofenac for a long time and that it helps, and that she would like a refill.  She states that recently she was told that her heart was not working properly, but now she is doing better.              ROS:   All other systems were reviewed and are negative.  Objective: Vital Signs: There were no vitals taken for this visit.  Physical Exam:  General:  Alert and oriented, in no acute distress. Pulm:  Breathing unlabored. Psy:  Normal mood, congruent affect.  Low back: She has tenderness to palpation bilaterally in the lumbosacral paraspinous muscles.  Negative straight leg raise.  Imaging: No results found.  Assessment & Plan: 1.  Chronic low back pain -Gave her 60 tablets of diclofenac but recommended that she ask her cardiologist first before taking it. - Referral for another epidural steroid injection.  Follow-up as needed.     Procedures: No procedures performed        PMFS History: Patient Active Problem List   Diagnosis Date Noted  . Protein-calorie malnutrition, severe 05/15/2020  . Malnutrition of moderate degree 05/13/2020  . Periumbilical abdominal pain   . Dysphagia   .  Nausea vomiting and diarrhea   . Generalized abdominal pain   . E. coli UTI 02/20/2020  . Acute metabolic encephalopathy 19/50/9326  . Psychosis ([place]) 02/20/2020  . Schizophrenia, schizo-affective ([place]) 02/20/2020  . Metabolic acidosis 71/24/5809  . Altered mental status   . Hypocalcemia   . AKI (acute kidney injury) ([place]) 02/18/2020  . Pelvic mass in female 01/02/2020  . Genital herpes 11/25/2019  . Pressure injury of skin 10/28/2019  . ARF (acute renal failure) ([place]) 10/27/2019  . Family discord 02/04/2019  . Aggressive behavior   . PTSD (post-traumatic stress disorder) 05/27/2018  . Schizoaffective disorder, bipolar type ([place]) 04/05/2018  . Closed displaced fracture of fifth metacarpal bone 03/21/2018  . Difficulty with speech 01/24/2018  . Encephalopathy 11/21/2017  . Drug abuse ([place]) 11/21/2017  . Frequent falls 10/11/2017  . Binge eating disorder   . Dependence on continuous supplemental oxygen 05/14/2017  . Gout 04/11/2017  . Hypomagnesemia   . CKD (chronic kidney disease) stage 3, GFR 30-59 ml/min (HCC) 12/15/2016  . Carotid artery stenosis   . Hypokalemia   . Osteoarthritis 10/26/2016  . Anoxic brain injury ([place]) 09/08/2016  . Overactive bladder 06/07/2016  . QT prolongation   . OSA and COPD overlap syndrome ([place])   . Arthritis   . Essential  hypertension 03/22/2016  . Cocaine use disorder, severe, in sustained remission ([place]) 12/17/2015  . History of drug abuse in remission ([place]) 11/28/2015  . Hyponatremia 11/25/2015  . Chronic diastolic congestive heart failure ([place])   . Chronic respiratory failure with hypoxia and hypercapnia ([place]) 06/22/2015  . Tobacco use disorder 07/22/2014  . COPD (chronic obstructive pulmonary disease) ([place]) 07/08/2014  . Seizure ([place]) 01/04/2013  . Chronic pain syndrome 06/18/2012  . Dyslipidemia 04/24/2011  . Diabetic neuropathy ([place]) 04/24/2011  . Morbid obesity ([place]) 10/18/2010  . Type 2 diabetes mellitus ([place]) 10/18/2010   Past  Medical History:  Diagnosis Date  . Agitation 11/22/2017  . Anoxic brain injury ([place]) 09/08/2016   C. Arrest due to respiratory failure and COPD exacerbation  . Anxiety   . Arthritis    "all over" (04/10/2016)  . Asthma 10/18/2010  . Binge eating disorder   . Cardiac arrest ([place]) 09/08/2016   PEA  . Carotid artery stenosis    1-39% bilateral by dopplers 11/2016  . Chronic diastolic (congestive) heart failure ([place])   . Chronic kidney disease    "I see a kidney dr." (04/10/2016)  . Chronic pain syndrome 06/18/2012  . Chronic post-traumatic stress disorder (PTSD) 05/27/2018  . Chronic respiratory failure with hypoxia and hypercapnia (HCC) 06/22/2015   TRILOGY Vent >AVAPA-ES., Vt target 200-400, Max P 30 , PS max 20 , PS min 6-10 , E Max 6, E Min 4, Rate Auto AVAPS Rate 2 (titrate for pt comfort) , bleed O2 at 5l/m continuous flow .   [name] CKD (chronic kidney disease) stage 3, GFR 30-59 ml/min (HCC) 12/15/2016  . Closed displaced fracture of fifth metacarpal bone 03/21/2018  . Cocaine use disorder, severe, in sustained remission ([place]) 12/17/2015  . Complication of anesthesia    decreased bp, decreased heart rate  . COPD (chronic obstructive pulmonary disease) ([place]) 07/08/2014  . Depression   . Diabetic neuropathy ([place]) 04/24/2011  . Difficulty with speech 01/24/2018  . Disorder of nervous system   . Drug abuse ([place]) 11/21/2017  . Dyslipidemia 04/24/2011  . Elevated troponin 04/28/2012  . Emphysema   . Encephalopathy 11/21/2017  . Essential hypertension 03/22/2016  . Fibula fracture 07/10/2016  . Frequent falls 10/11/2017  . GERD (gastroesophageal reflux disease)   . Gout 04/11/2017  . Heart attack ([place]) 1980s  . History of blood transfusion 1994   "couldn't stop bleeding from my period"  . History of drug abuse in remission ([place]) 11/28/2015   Quit in 2017  . Hyperlipidemia LDL goal <70   . Incontinence   . Manic depression ([place])   . Morbid obesity ([place]) 10/18/2010  . Obstructive sleep apnea 10/18/2010   . On home oxygen therapy    "6L; 24/7" (04/10/2016)  . OSA on CPAP    "wear mask sometimes" (04/10/2016)  . Paranoid ([place])    "sometimes; I'm on RX for it" (04/10/2016)  . Prolonged Q-T interval on ECG   . Rectal bleeding 12/31/2015  . Right carotid bruit 11/09/2016  . Schizoaffective disorder, bipolar type ([place]) 04/05/2018  . Seasonal allergies   . Seborrheic keratoses 12/31/2013  . Seizures ([place])    "don't know what kind; last one was ~ 1 yr ago" (04/10/2016)  . Stroke [facility]) 1980s   denies residual on 04/10/2016  . Thrush 09/19/2013  . Type 2 diabetes mellitus ([place]) 10/18/2010    Family History  Problem Relation Age of Onset  . Cancer Father        prostate  .  Cancer Mother        lung  . Depression Mother   . Depression Sister   . Anxiety disorder Sister   . Schizophrenia Sister   . Bipolar disorder Sister   . Depression Sister   . Depression Brother   . Heart failure Other        cousin    Past Surgical History:  Procedure Laterality Date  . CESAREAN SECTION  1997  . HERNIA REPAIR    . IR CHOLANGIOGRAM EXISTING TUBE  07/20/2016  . IR PERC CHOLECYSTOSTOMY  05/10/2016  . IR RADIOLOGIST EVAL & MGMT  06/08/2016  . IR RADIOLOGIST EVAL & MGMT  06/29/2016  . IR SINUS/FIST TUBE CHK-NON GI  07/12/2016  . RIGHT/LEFT HEART CATH AND CORONARY ANGIOGRAPHY N/A 06/19/2017   Procedure: RIGHT/LEFT HEART CATH AND CORONARY ANGIOGRAPHY;  Surgeon: Jolaine Artist, MD;  Location: [facility];  Service: Cardiovascular;  Laterality: N/A;  . TIBIA IM NAIL INSERTION Right 07/12/2016   Procedure: INTRAMEDULLARY (IM) NAIL RIGHT TIBIA;  Surgeon: Leandrew Koyanagi, MD;  Location: [facility];  Service: Orthopedics;  Laterality: Right;  . UMBILICAL HERNIA REPAIR  ~ 1963   "that's why I don't have a belly button"  . VAGINAL HYSTERECTOMY     Social History   Occupational History  . Occupation: disabled    Comment: factory production  Tobacco Use  . Smoking status: Former Smoker    Packs/day: 1.50     Years: 38.00    Pack years: 57.00    Types: Cigarettes    Start date: 03/13/1977    Quit date: 04/10/2016    Years since quitting: 4.1  . Smokeless tobacco: Never Used  Vaping Use  . Vaping Use: Never used  Substance and Sexual Activity  . Alcohol use: No    Alcohol/week: 0.0 standard drinks  . Drug use: No    Types: Cocaine    Comment: 04/10/2016 "last used cocaine back in November 2017"  . Sexual activity: Not Currently    Birth control/protection: Surgical

## 2020-06-02 ENCOUNTER — Other Ambulatory Visit: Payer: Self-pay | Admitting: Family Medicine

## 2020-06-02 DIAGNOSIS — R600 Localized edema: Secondary | ICD-10-CM

## 2020-06-02 NOTE — Telephone Encounter (Signed)
Notes to clinic:  requesting a 90 day supply   Requested Prescriptions  Pending Prescriptions Disp Refills   furosemide (LASIX) 20 MG tablet [Pharmacy Med Name: FUROSEMIDE 20MG  TABLETS] 90 tablet     Sig: TAKE 1 TABLET(20 MG) BY MOUTH DAILY      Cardiovascular:  Diuretics - Loop Failed - 06/02/2020  8:36 AM      Failed - Ca in normal range and within 360 days    Calcium  Date Value Ref Range Status  05/14/2020 8.2 (L) 8.9 - 10.3 mg/dL Final   Calcium, Ion  Date Value Ref Range Status  10/27/2019 1.25 1.15 - 1.40 mmol/L Final          Failed - Cr in normal range and within 360 days    Creat  Date Value Ref Range Status  02/18/2016 1.22 (H) 0.50 - 1.05 mg/dL Final    Comment:      For patients > or = 61 years of age: The upper reference limit for Creatinine is approximately 13% higher for people identified as African-American.      Creatinine, Ser  Date Value Ref Range Status  05/14/2020 1.73 (H) 0.44 - 1.00 mg/dL Final   Creatinine,U  Date Value Ref Range Status  06/17/2012 521.94 mg/dL Final    Comment:    Result confirmed by automatic dilution.    Cutoff Values for Urine Drug Screen, Pain Mgmt          Drug Class           Cutoff (ng/mL)          Amphetamines             500          Barbiturates             200          Cocaine Metabolites      150          Benzodiazepines          200          Methadone                300          Opiates                  300          Phencyclidine             25          Propoxyphene             300          Marijuana Metabolites     50    For medical purposes only.   Creatinine, Urine  Date Value Ref Range Status  02/19/2020 300.84 mg/dL Final    Comment:    Performed at [facility] [place] [address] [place], [place] [address]          Passed - K in normal range and within 360 days    Potassium  Date Value Ref Range Status  05/14/2020 3.6 3.5 - 5.1 mmol/L Final          Passed - Na in  normal range and within 360 days    Sodium  Date Value Ref Range Status  05/14/2020 137 135 - 145 mmol/L Final  11/25/2019 142 134 - 144 mmol/L Final          Passed -  Last BP in normal range    BP Readings from Last 1 Encounters:  05/31/20 103/66          Passed - Valid encounter within last 6 months    Recent Outpatient Visits           2 days ago Heel ulcer, left, with fat layer exposed ([place])   [place], Enobong, MD   1 week ago Edema of both legs   [place], Deborah B, MD   2 months ago Pelvic mass in female   [place], Enobong, MD   5 months ago Overactive bladder   [place], MD   1 year ago Refused assessment of physical health   [facility], Delfino Lovett, NP       Future Appointments             In 2 days [name], MD [place], [facility]   In 2 weeks Heber Lovejoy, Elza Rafter, DO [place] [facility] [place], [facility]   In 3 weeks [name], MD [place]

## 2020-06-03 ENCOUNTER — Other Ambulatory Visit: Payer: Self-pay

## 2020-06-03 ENCOUNTER — Ambulatory Visit (INDEPENDENT_AMBULATORY_CARE_PROVIDER_SITE_OTHER): Payer: 59 | Admitting: Psychiatry

## 2020-06-03 DIAGNOSIS — F250 Schizoaffective disorder, bipolar type: Secondary | ICD-10-CM | POA: Diagnosis not present

## 2020-06-04 ENCOUNTER — Ambulatory Visit (INDEPENDENT_AMBULATORY_CARE_PROVIDER_SITE_OTHER): Payer: 59 | Admitting: Internal Medicine

## 2020-06-04 ENCOUNTER — Other Ambulatory Visit: Payer: Self-pay

## 2020-06-04 ENCOUNTER — Encounter: Payer: Self-pay | Admitting: Internal Medicine

## 2020-06-04 VITALS — BP 132/78 | HR 78 | Ht 63.0 in | Wt 125.0 lb

## 2020-06-04 DIAGNOSIS — M7989 Other specified soft tissue disorders: Secondary | ICD-10-CM

## 2020-06-04 NOTE — Progress Notes (Signed)
Cardiology Office Note   Date:  06/04/2020   ID:  Tiffany [name], DOB 04/06/1960, MRN [id number]  PCP:  [name], MD  Cardiologist:   Dorris Carnes, MD   Pt referred for evaluation of LE edema     History of Present Illness: Tiffany [name] is a 60 y.o. female with a history of asthma, COPD (on O2), chronic diastolic CHF, HTN, HL, Type II DM, cocaine use, CKD and anemia Also a hix of psychiatric problems and noncompliance    She has been seen in the past by [facility] and D [facility]. Last in 2019    Echo in 2018 LVEF 60 to 65%   R and L heart cath in 2019 showed normal coronary arteries; mild PAH (RA10, RV 40/12; PA 48/18   PCWP 19; PVR 1.5 WU   Cardiac index 3) and LVEF of 60 to 65%  The pt was recently seen in IM   Had swelling of legs   Prescribed lasix (20 mg ) and compression hose   Recomm follow up in cardiology   Patient denies CP   No dizziness   The patient says her breahing is stable    Uses oxygen     Current Meds  Medication Sig  . albuterol (PROVENTIL) (2.5 MG/3ML) 0.083% nebulizer solution Take 3 mLs (2.5 mg total) by nebulization every 6 (six) hours as needed for wheezing or shortness of breath.  [name] albuterol (VENTOLIN HFA) 108 (90 Base) MCG/ACT inhaler Inhale 2 puffs into the lungs every 6 (six) hours as needed for wheezing or shortness of breath.  [name] asenapine (SAPHRIS) 5 MG SUBL 24 hr tablet Place 2 tablets (10 mg total) under the tongue 2 (two) times daily.  . diazepam (VALIUM) 10 MG tablet Take 1 tablet (10 mg total) by mouth 3 (three) times daily as needed for anxiety.  . diclofenac (VOLTAREN) 75 MG EC tablet Take 1 tablet (75 mg total) by mouth 2 (two) times daily as needed.  . diclofenac Sodium (VOLTAREN) 1 % GEL Apply 4 g topically 4 (four) times daily.  [name] escitalopram (LEXAPRO) 10 MG tablet Take 1 tablet (10 mg total) by mouth daily.  . feeding supplement (ENSURE ENLIVE / ENSURE PLUS) LIQD Take 237 mLs by mouth 2 (two) times daily between meals.  .  Fluticasone-Umeclidin-Vilant (TRELEGY ELLIPTA) 100-62.5-25 MCG/INH AEPB Inhale 1 puff into the lungs daily.  . furosemide (LASIX) 20 MG tablet Take 1 tablet (20 mg total) by mouth daily.  [name] gabapentin (NEURONTIN) 600 MG tablet Take 0.5 tablets (300 mg total) by mouth 2 (two) times daily.  [name] lisdexamfetamine (VYVANSE) 70 MG capsule Take 70 mg by mouth daily.  . metolazone (ZAROXOLYN) 2.5 MG tablet Take 2.5 mg by mouth daily. MONDAY TUESDAY AND FRIDAY  . Misc. Devices [place] Power wheelchair.  Diagnosis-frequent falls, bilateral knee osteoarthritis  . montelukast (SINGULAIR) 10 MG tablet Take 1 tablet (10 mg total) by mouth at bedtime.  [name] omeprazole (PRILOSEC) 40 MG capsule Take 1 capsule (40 mg total) by mouth daily.  [name] oxybutynin (DITROPAN) 5 MG tablet TAKE 1 TABLET(5 MG) BY MOUTH TWICE DAILY  . potassium chloride (KLOR-CON) 10 MEQ tablet Take 1 tablet (10 mEq total) by mouth daily. (Patient taking differently: Take 40 mEq by mouth in the morning and at bedtime.)  . prazosin (MINIPRESS) 5 MG capsule Take 5 mg by mouth 2 (two) times daily.  . silver sulfADIAZINE (SILVADENE) 1 % cream Apply 1 application topically 2 (two) times daily.  [name]  thiamine 100 MG tablet Take 1 tablet (100 mg total) by mouth daily.  [name] tiZANidine (ZANAFLEX) 4 MG tablet Take 1 tablet (4 mg total) by mouth every 8 (eight) hours as needed for muscle spasms.  [name] topiramate (TOPAMAX) 25 MG tablet Take 1 tablet (25 mg total) by mouth 2 (two) times daily.   Current Facility-Administered Medications for the 06/04/20 encounter (Office Visit) with [name], MD  Medication  . betamethasone acetate-betamethasone sodium phosphate (CELESTONE) injection 6 mg     Allergies:   Hydrocodone, Hydrocodone-acetaminophen, Hydroxyzine, Latuda [lurasidone hcl], Lurasidone, Magnesium-containing compounds, Prednisone, Tramadol, Codeine, Other, Sulfa antibiotics, and Tape   Past Medical History:  Diagnosis Date  . Agitation 11/22/2017  . Anoxic  brain injury ([place]) 09/08/2016   C. Arrest due to respiratory failure and COPD exacerbation  . Anxiety   . Arthritis    "all over" (04/10/2016)  . Asthma 10/18/2010  . Binge eating disorder   . Cardiac arrest ([place]) 09/08/2016   PEA  . Carotid artery stenosis    1-39% bilateral by dopplers 11/2016  . Chronic diastolic (congestive) heart failure ([place])   . Chronic kidney disease    "I see a kidney dr." (04/10/2016)  . Chronic pain syndrome 06/18/2012  . Chronic post-traumatic stress disorder (PTSD) 05/27/2018  . Chronic respiratory failure with hypoxia and hypercapnia (HCC) 06/22/2015   TRILOGY Vent >AVAPA-ES., Vt target 200-400, Max P 30 , PS max 20 , PS min 6-10 , E Max 6, E Min 4, Rate Auto AVAPS Rate 2 (titrate for pt comfort) , bleed O2 at 5l/m continuous flow .   [name] CKD (chronic kidney disease) stage 3, GFR 30-59 ml/min (HCC) 12/15/2016  . Closed displaced fracture of fifth metacarpal bone 03/21/2018  . Cocaine use disorder, severe, in sustained remission ([place]) 12/17/2015  . Complication of anesthesia    decreased bp, decreased heart rate  . COPD (chronic obstructive pulmonary disease) ([place]) 07/08/2014  . Depression   . Diabetic neuropathy ([place]) 04/24/2011  . Difficulty with speech 01/24/2018  . Disorder of nervous system   . Drug abuse ([place]) 11/21/2017  . Dyslipidemia 04/24/2011  . Elevated troponin 04/28/2012  . Emphysema   . Encephalopathy 11/21/2017  . Essential hypertension 03/22/2016  . Fibula fracture 07/10/2016  . Frequent falls 10/11/2017  . GERD (gastroesophageal reflux disease)   . Gout 04/11/2017  . Heart attack ([place]) 1980s  . History of blood transfusion 1994   "couldn't stop bleeding from my period"  . History of drug abuse in remission ([place]) 11/28/2015   Quit in 2017  . Hyperlipidemia LDL goal <70   . Incontinence   . Manic depression ([place])   . Morbid obesity ([place]) 10/18/2010  . Obstructive sleep apnea 10/18/2010  . On home oxygen therapy    "6L; 24/7" (04/10/2016)  . OSA on CPAP     "wear mask sometimes" (04/10/2016)  . Paranoid ([place])    "sometimes; I'm on RX for it" (04/10/2016)  . Prolonged Q-T interval on ECG   . Rectal bleeding 12/31/2015  . Right carotid bruit 11/09/2016  . Schizoaffective disorder, bipolar type ([place]) 04/05/2018  . Seasonal allergies   . Seborrheic keratoses 12/31/2013  . Seizures ([place])    "don't know what kind; last one was ~ 1 yr ago" (04/10/2016)  . Stroke [facility]) 1980s   denies residual on 04/10/2016  . Thrush 09/19/2013  . Type 2 diabetes mellitus ([place]) 10/18/2010    Past Surgical History:  Procedure Laterality Date  . CESAREAN  SECTION  1997  . HERNIA REPAIR    . IR CHOLANGIOGRAM EXISTING TUBE  07/20/2016  . IR PERC CHOLECYSTOSTOMY  05/10/2016  . IR RADIOLOGIST EVAL & MGMT  06/08/2016  . IR RADIOLOGIST EVAL & MGMT  06/29/2016  . IR SINUS/FIST TUBE CHK-NON GI  07/12/2016  . RIGHT/LEFT HEART CATH AND CORONARY ANGIOGRAPHY N/A 06/19/2017   Procedure: RIGHT/LEFT HEART CATH AND CORONARY ANGIOGRAPHY;  Surgeon: Jolaine Artist, MD;  Location: [facility];  Service: Cardiovascular;  Laterality: N/A;  . TIBIA IM NAIL INSERTION Right 07/12/2016   Procedure: INTRAMEDULLARY (IM) NAIL RIGHT TIBIA;  Surgeon: Leandrew Koyanagi, MD;  Location: [facility];  Service: Orthopedics;  Laterality: Right;  . UMBILICAL HERNIA REPAIR  ~ 1963   "that's why I don't have a belly button"  . VAGINAL HYSTERECTOMY       Social History:  The patient  reports that she quit smoking about 4 years ago. Her smoking use included cigarettes. She started smoking about 43 years ago. She has a 57.00 pack-year smoking history. She has never used smokeless tobacco. She reports that she does not drink alcohol and does not use drugs.   Family History:  The patient's family history includes Anxiety disorder in her sister; Bipolar disorder in her sister; Cancer in her father and mother; Depression in her brother, mother, sister, and sister; Heart failure in an other family member; Schizophrenia  in her sister.    ROS:  Please see the history of present illness. All other systems are reviewed and  Negative to the above problem except as noted.    PHYSICAL EXAM: VS:  BP 132/78   Pulse 78   Ht 5\' 3"  (1.6 m)   Wt 125 lb (56.7 kg) Comment: pt stated weight-couldnt weigh on scale  BMI 22.14 kg/m   GEN: Well nourished, well developed, in no acute distress    Examined in wheelchair   HEENT: normal  Neck: no JVD, carotid bruits, Cardiac: RRR; no murmurs, rubs, or gallops,1+ edema  Respiratory:  clear to auscultation bilaterally, normal work of breathing GI: soft, nontender, nondistended, + BS  No hepatomegaly  MS: no deformity Moving all extremities   Skin: warm and dry, no rash Neuro:  Strength and sensation are intact Psych: euthymic mood, full affect   EKG:  EKG is not ordered today.   Lipid Panel    Component Value Date/Time   CHOL 127 08/05/2018 1748   TRIG 53 08/05/2018 1748   HDL 62 08/05/2018 1748   CHOLHDL 2.0 08/05/2018 1748   CHOLHDL 3.7 01/07/2017 0005   VLDL 38 01/07/2017 0005   LDLCALC 54 08/05/2018 1748   LDLDIRECT 52 04/05/2012 1210      Wt Readings from Last 3 Encounters:  06/04/20 125 lb (56.7 kg)  05/15/20 131 lb 2.8 oz (59.5 kg)  02/26/20 150 lb 9.2 oz (68.3 kg)      ASSESSMENT AND PLAN:  1  Acute on chronic diastolic CHF   Pt has not been seen in cardiol since 2019   Volume does appear to be up some  Will need to repeat labs to comment on what changes should be made   If does not respond, would recomm repeat echo to reassess LVEF and RVEF    2  COPD   Significant   On O2   Follow May be exarcerbating problem1    3   HTN  BP controlled  Follow   4  LIpids   Excellent in  2020  F/U later this summer in clinic    Current medicines are reviewed at length with the patient today.  The patient does not have concerns regarding medicines.  Signed, Dorris Carnes, MD  06/04/2020 11:14 AM    [address],  [place], [place]  [address] Phone: [phone]; Fax: [phone]

## 2020-06-04 NOTE — Patient Instructions (Signed)
Medication Instructions:  No changes *If you need a refill on your cardiac medications before your next appointment, please call your pharmacy*   Lab Work: Today: CMET, BNP If you have labs (blood work) drawn today and your tests are completely normal, you will receive your results only by: [name] MyChart Message (if you have MyChart) OR . A paper copy in the mail If you have any lab test that is abnormal or we need to change your treatment, we will call you to review the results.   Testing/Procedures: none   Follow-Up: At [facility], you and your health needs are our priority.  As part of our continuing mission to provide you with exceptional heart care, we have created designated Provider Care Teams.  These Care Teams include your primary Cardiologist (physician) and Advanced Practice Providers (APPs -  Physician Assistants and Nurse Practitioners) who all work together to provide you with the care you need, when you need it.  We recommend signing up for the patient portal called "MyChart".  Sign up information is provided on this After Visit Summary.  MyChart is used to connect with patients for Virtual Visits (Telemedicine).  Patients are able to view lab/test results, encounter notes, upcoming appointments, etc.  Non-urgent messages can be sent to your provider as well.   To learn more about what you can do with MyChart, go to [email or web address].    Your next appointment:   2 month(s)  The format for your next appointment:   In Person  Provider:   You may see Dorris Carnes, MD or one of the following Advanced Practice Providers on your designated Care Team:    Richardson Dopp, PA-C  Robbie Lis, [place]   Other Instructions

## 2020-06-05 LAB — COMPREHENSIVE METABOLIC PANEL
ALT: 10 IU/L (ref 0–32)
AST: 17 IU/L (ref 0–40)
Albumin/Globulin Ratio: 1.5 (ref 1.2–2.2)
Albumin: 3.2 g/dL — ABNORMAL LOW (ref 3.8–4.8)
Alkaline Phosphatase: 99 IU/L (ref 44–121)
BUN/Creatinine Ratio: 14 (ref 12–28)
BUN: 22 mg/dL (ref 8–27)
Bilirubin Total: <0.2 mg/dL (ref 0.0–1.2)
CO2: 21 mmol/L (ref 20–29)
Calcium: 9 mg/dL (ref 8.7–10.3)
Chloride: 109 mmol/L — ABNORMAL HIGH (ref 96–106)
Creatinine, Ser: 1.57 mg/dL — ABNORMAL HIGH (ref 0.57–1.00)
Globulin, Total: 2.1 g/dL (ref 1.5–4.5)
Glucose: 73 mg/dL (ref 65–99)
Potassium: 3.7 mmol/L (ref 3.5–5.2)
Sodium: 143 mmol/L (ref 134–144)
Total Protein: 5.3 g/dL — ABNORMAL LOW (ref 6.0–8.5)
eGFR: 37 mL/min/{1.73_m2} — ABNORMAL LOW (ref >59)

## 2020-06-05 LAB — PRO B NATRIURETIC PEPTIDE: NT-Pro BNP: 11232 pg/mL — ABNORMAL HIGH (ref 0–287)

## 2020-06-07 ENCOUNTER — Telehealth: Payer: Self-pay

## 2020-06-07 DIAGNOSIS — M7989 Other specified soft tissue disorders: Secondary | ICD-10-CM

## 2020-06-07 DIAGNOSIS — R7989 Other specified abnormal findings of blood chemistry: Secondary | ICD-10-CM

## 2020-06-07 MED ORDER — FUROSEMIDE 80 MG PO TABS: 80.0000 mg | ORAL_TABLET | Freq: Every day | ORAL | 3 refills | Status: DC

## 2020-06-07 MED ORDER — METOLAZONE 2.5 MG PO TABS: 2.5000 mg | ORAL_TABLET | ORAL | 0 refills | Status: DC

## 2020-06-07 NOTE — Telephone Encounter (Signed)
-----   Message from [name], MD sent at 06/05/2020 10:49 PM EDT ----- Fluid is severely elevated  Would recomm 80 mg lasix daily   Keep on KCL      Metalozone 2x per week only  (30 min prior to lasix) F?U BMET and BNP on Fri I would recomm a repeat echo to reassess LV and RV funciton

## 2020-06-07 NOTE — Telephone Encounter (Signed)
Called patient and reviewed results and MD recommendations.  Patient verbalizes understanding.  Will send to Dr. Harrington Challenger to clarify dosage of Metolazone.  Pt verbalize understanding to take metolazone twice per week 30 min before lasix.

## 2020-06-08 ENCOUNTER — Telehealth (HOSPITAL_COMMUNITY): Payer: Self-pay | Admitting: *Deleted

## 2020-06-08 NOTE — Telephone Encounter (Signed)
Pt called this morning asking about [facility]se refill. Pt was very upset that her medications have been changed and demands provider put her back on previous medication regime. Pt stated that she "would find any doctor". Writer advised pt that there are no providers in this practice that are able to take "new" pts. Writer advised this was pt decision but encouraged pt to keep scheduled appointment with [name] on 06/16/20 at least until pt finds another provider. Pt reluctantly agreed. Writer will seek advice from [name] regarding medications and call pt back.

## 2020-06-08 NOTE — Telephone Encounter (Signed)
She needs to keep her appointment with [name] on June 1st to discuss about her medication.  We can provide the list of outside psychiatrist if she prefers.

## 2020-06-10 ENCOUNTER — Inpatient Hospital Stay
Admission: RE | Admit: 2020-06-10 | Discharge: 2020-06-10 | Disposition: A | Discharge status: Home / Self Care | Payer: 59 | Source: Ambulatory Visit | Attending: Family Medicine | Admitting: Family Medicine

## 2020-06-10 NOTE — Discharge Instructions (Signed)
Post Procedure Spinal Discharge Instruction Sheet  1. You may resume a regular diet and any medications that you routinely take (including pain medications).  2. No driving day of procedure.  3. Light activity throughout the rest of the day.  Do not do any strenuous work, exercise, bending or lifting.  The day following the procedure, you can resume normal physical activity but you should refrain from exercising or physical therapy for at least three days thereafter.   Common Side Effects:   Headaches- take your usual medications as directed by your physician.  Increase your fluid intake.  Caffeinated beverages may be helpful.  Lie flat in bed until your headache resolves.   Restlessness or inability to sleep- you may have trouble sleeping for the next few days.  Ask your referring physician if you need any medication for sleep.   Facial flushing or redness- should subside within a few days.   Increased pain- a temporary increase in pain a day or two following your procedure is not unusual.  Take your pain medication as prescribed by your referring physician.   Leg cramps  Please contact our office at [phone] for the following symptoms:  Fever greater than 100 degrees.  Headaches unresolved with medication after 2-3 days.  Increased swelling, pain, or redness at injection site.   Thank you for visiting [facility] today.  

## 2020-06-11 ENCOUNTER — Other Ambulatory Visit: Payer: 59

## 2020-06-11 ENCOUNTER — Telehealth: Payer: Self-pay | Admitting: Internal Medicine

## 2020-06-11 IMAGING — MG DIGITAL SCREENING BILATERAL MAMMOGRAM WITH TOMO AND CAD
6 of 10 series · 6 of 30 positions shown · non-contrast
Comparison: None.

ACR Breast Density Category a: The breast tissue is almost entirely
fatty.

CLINICAL DATA: Screening.

EXAM:
DIGITAL SCREENING BILATERAL MAMMOGRAM WITH TOMO AND CAD

[R CV synth-2D]
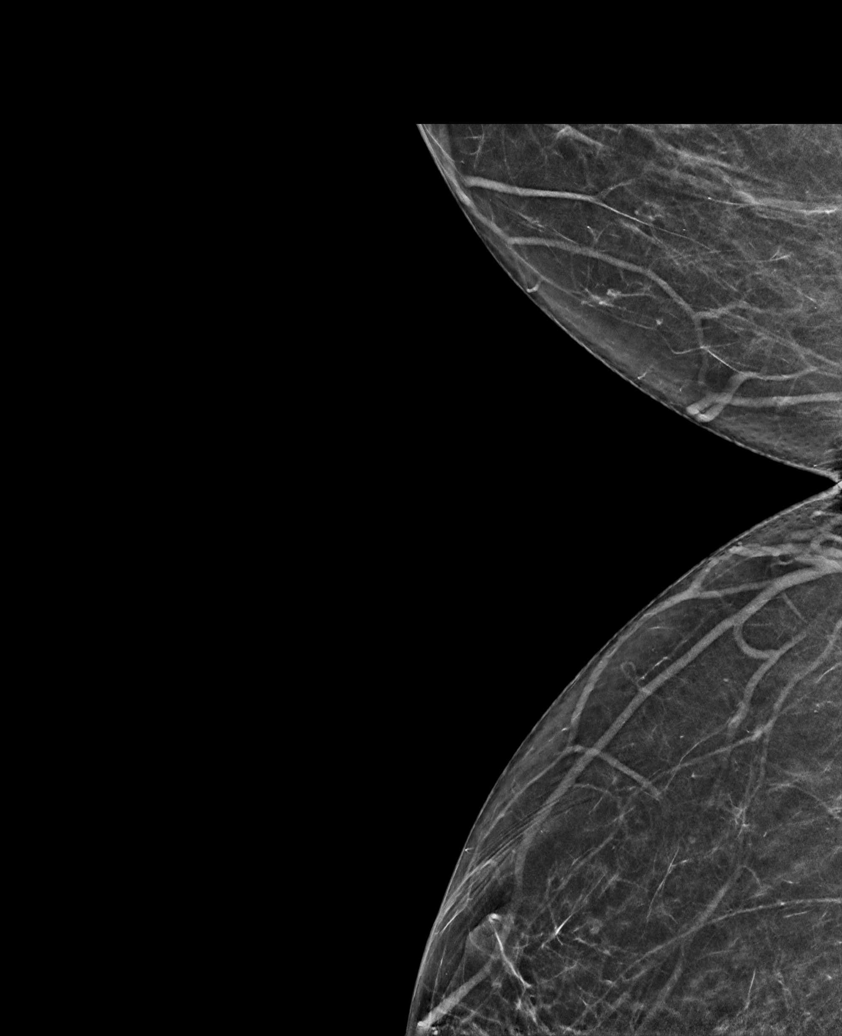

[R MLO synth-2D]
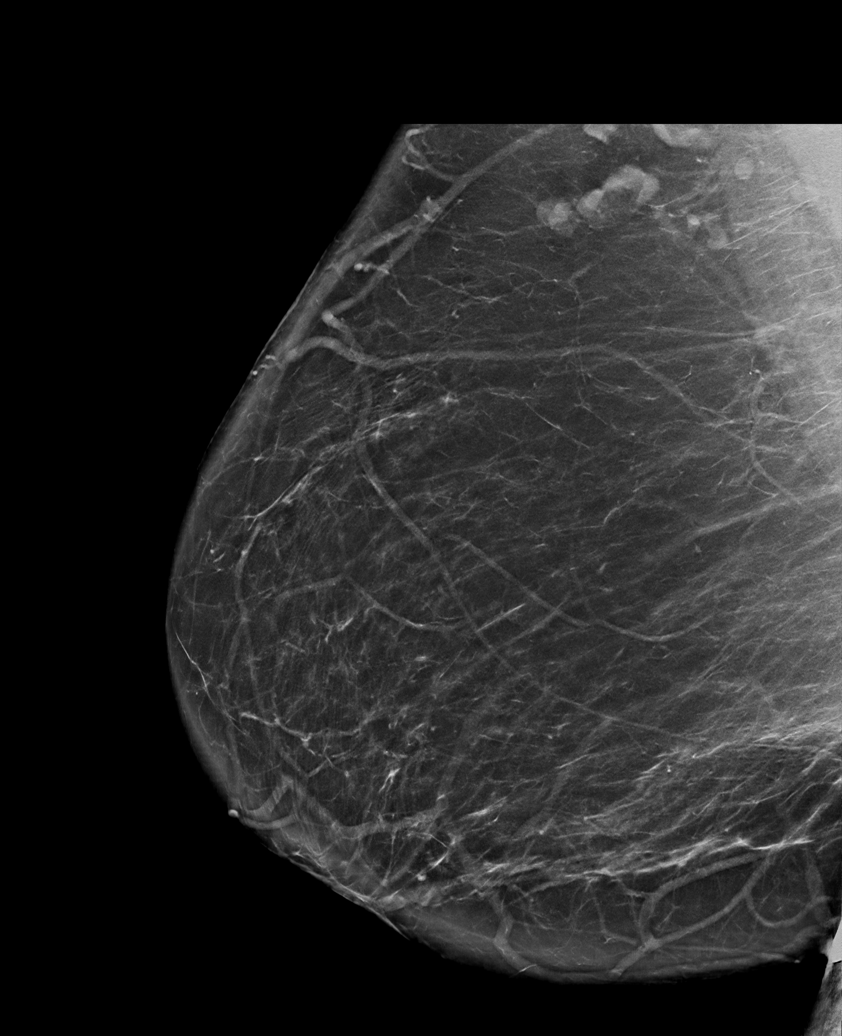

[L MLO synth-2D]
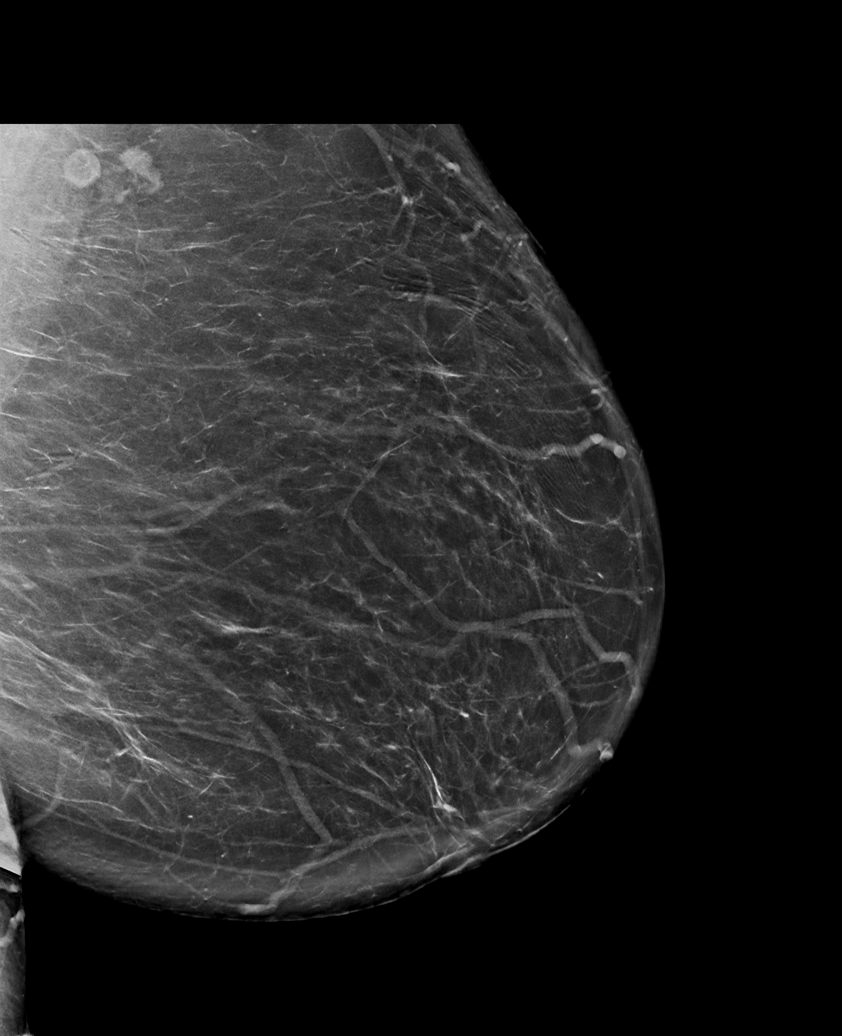

[R CC synth-2D]
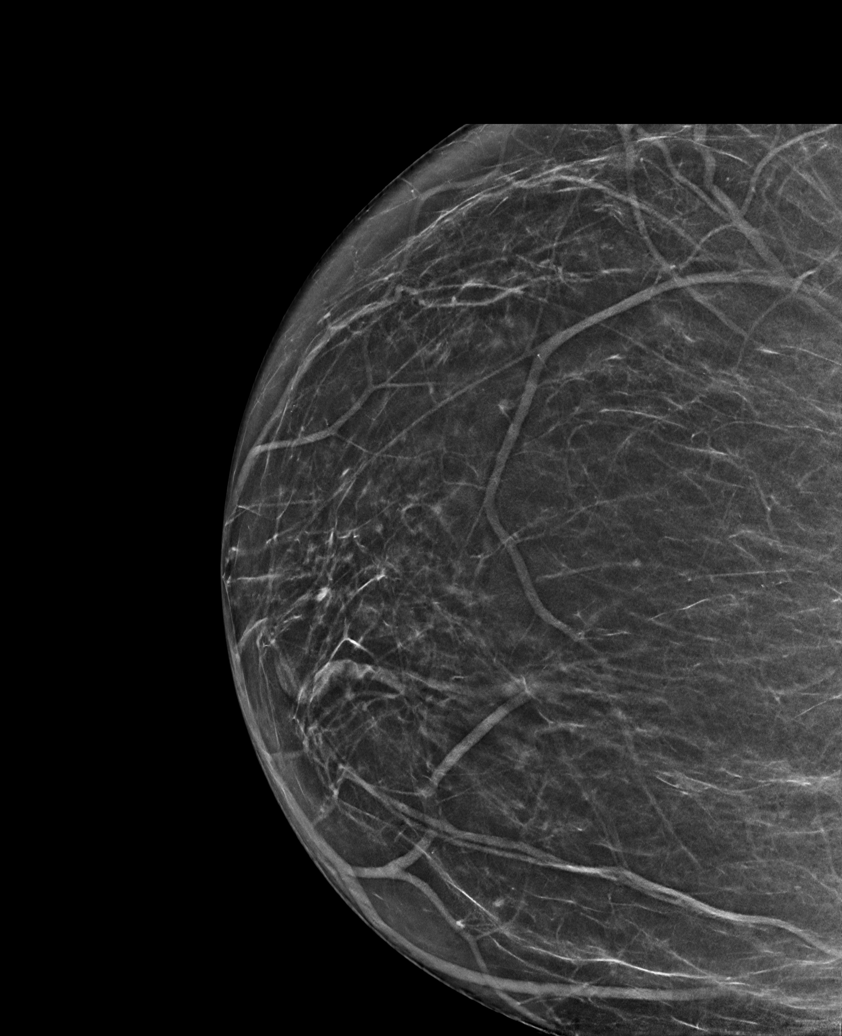

[L CC synth-2D]
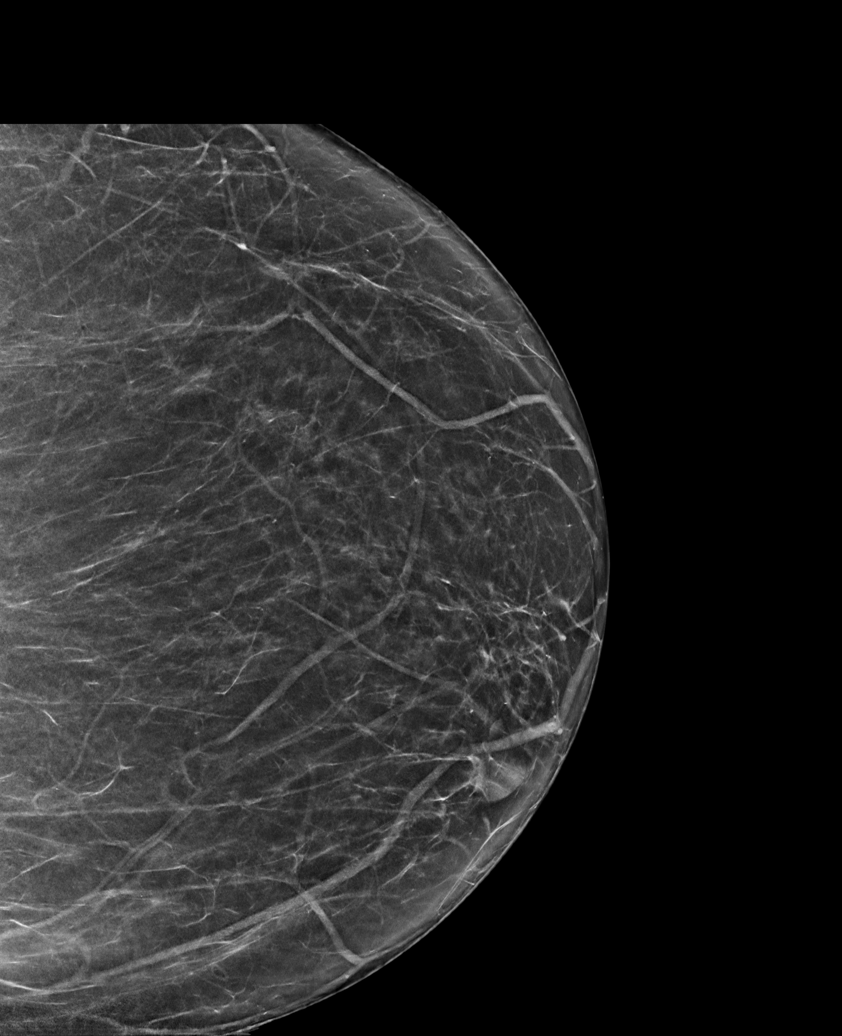

[L CC tomo · tomo slice 35/70.0]
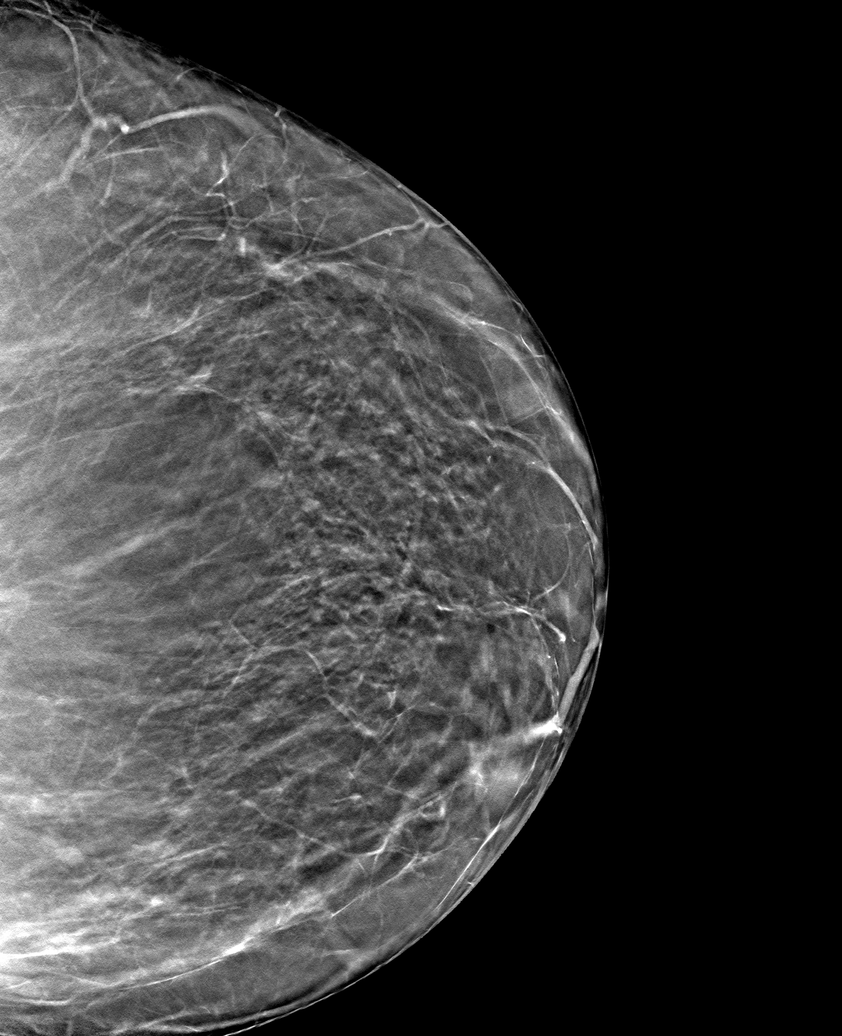

[6 of 30 positions shown; findings below may reference images not displayed]

FINDINGS: There are no findings suspicious for malignancy. Images were
processed with CAD.
IMPRESSION: No mammographic evidence of malignancy. A result letter of this
screening mammogram will be mailed directly to the patient.

RECOMMENDATION:
Screening mammogram in one year. (Code:[id number])

BI-RADS CATEGORY  1: Negative.

## 2020-06-11 NOTE — Telephone Encounter (Signed)
Patient called to report swelling in her legs and to discuss her chronic heart failure.  Her legs are tender and hurting all the time. She had not scheduled echo yet.  I got her set up for this next month.  She was tearful on the phone that her legs hurt, they have for a long time.  She saw Dr. Harrington Challenger one week ago. We discussed that she is taking lsix 80 mg daily and has started taking metolazone twice a week and has repeat labs on 5/31.  She wants to come in and talk with Dr. Harrington Challenger about her legs hurting.  I adv that there is no soon availability and that is why she is seeing [facility] for follow up in August.

## 2020-06-11 NOTE — Telephone Encounter (Signed)
Pt c/o swelling: STAT is pt has developed SOB within 24 hours  1) How much weight have you gained and in what time span? not sure  2) If swelling, where is the swelling located? Legs   3) Are you currently taking a fluid pill? yes   4) Are you currently SOB? No  5) Do you have a log of your daily weights (if so, list)? no  6) Have you gained 3 pounds in a day or 5 pounds in a week? Not sure  7) Have you traveled recently? no

## 2020-06-12 ENCOUNTER — Telehealth: Payer: Self-pay | Admitting: Internal Medicine

## 2020-06-12 NOTE — Telephone Encounter (Signed)
Reviewed note.   Changes made with diuretic   Labs due on 5/31. Echo is ordered   Not sure if it can be expedited IF symptoms worsening, SOB only other option is to go to ER to get evaluated    May get treatment faster

## 2020-06-15 ENCOUNTER — Other Ambulatory Visit: Payer: Self-pay

## 2020-06-15 ENCOUNTER — Other Ambulatory Visit: Payer: 59 | Admitting: *Deleted

## 2020-06-15 DIAGNOSIS — M7989 Other specified soft tissue disorders: Secondary | ICD-10-CM

## 2020-06-15 DIAGNOSIS — R7989 Other specified abnormal findings of blood chemistry: Secondary | ICD-10-CM

## 2020-06-16 ENCOUNTER — Telehealth (HOSPITAL_COMMUNITY): Payer: 59 | Admitting: Psychiatry

## 2020-06-16 ENCOUNTER — Encounter (HOSPITAL_COMMUNITY): Payer: Self-pay

## 2020-06-16 ENCOUNTER — Encounter (HOSPITAL_BASED_OUTPATIENT_CLINIC_OR_DEPARTMENT_OTHER): Payer: 59 | Attending: Internal Medicine | Admitting: Internal Medicine

## 2020-06-16 LAB — BASIC METABOLIC PANEL
BUN/Creatinine Ratio: 12 (ref 12–28)
BUN: 23 mg/dL (ref 8–27)
CO2: 24 mmol/L (ref 20–29)
Calcium: 8.9 mg/dL (ref 8.7–10.3)
Chloride: 104 mmol/L (ref 96–106)
Creatinine, Ser: 1.87 mg/dL — ABNORMAL HIGH (ref 0.57–1.00)
Glucose: 97 mg/dL (ref 65–99)
Potassium: 3.5 mmol/L (ref 3.5–5.2)
Sodium: 143 mmol/L (ref 134–144)
eGFR: 30 mL/min/{1.73_m2} — ABNORMAL LOW (ref >59)

## 2020-06-16 LAB — PRO B NATRIURETIC PEPTIDE: NT-Pro BNP: 2313 pg/mL — ABNORMAL HIGH (ref 0–287)

## 2020-06-16 NOTE — Progress Notes (Signed)
06/16/2020 1 PM  Patient had scheduled medication management appointment today. Writer made several attempts to contact her via phone -no answer. Will inform front desk in order to reschedule.  Gabriel Earing MD

## 2020-06-16 NOTE — Telephone Encounter (Signed)
No answer, unable to leave a message.  Lab results reviewed by Dr. Harrington Challenger.  [name], MD  06/16/2020 8:54 AM EDT      Fluid is much improved   Kidney function is relatively stable Keep on same meds   Repeat in 2 wks to confirm stability in kidney function and to follow fluid Hold on echo for now since improving  Just had in [facility] (cancel)

## 2020-06-17 ENCOUNTER — Encounter (HOSPITAL_COMMUNITY): Payer: Self-pay | Admitting: Psychiatry

## 2020-06-17 ENCOUNTER — Telehealth: Payer: Self-pay | Admitting: Physical Medicine and Rehabilitation

## 2020-06-17 ENCOUNTER — Telehealth: Payer: Self-pay | Admitting: Family Medicine

## 2020-06-17 NOTE — Telephone Encounter (Signed)
Pt called requesting a call back to set an appt for back injection. Please call pt at [phone].

## 2020-06-17 NOTE — Telephone Encounter (Signed)
Pt called to check on status of an electric wheel chair fitting or order/ please advise asap / Pt asked for a call today

## 2020-06-17 NOTE — Telephone Encounter (Signed)
Copied from [place] [id number]. Topic: General - Other >> Jun 17, 2020  1:19 PM Leward Quan A wrote: Reason for CRM: Patient called in to inform Dr Margarita Rana that she missed the appointment with wound care and that they are not able to see her until August. She want to know what Dr Margarita Rana can do for her to be seen earlier someplace else. Please call Ph# [phone]
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
## 2020-06-17 NOTE — Telephone Encounter (Signed)
Order was faxed over to [facility] on 06/02/20.

## 2020-06-17 NOTE — Telephone Encounter (Signed)
Attempted again  to contact the patient # [phone] an update of the power chair order, the phone just rang, no option to leave a message.

## 2020-06-17 NOTE — Telephone Encounter (Signed)
This CM spoke to [facility] regarding power chair. She has the order in Epic and mobility exam information from Dr Margarita Rana.  The order is currently being reviewed and she will contact this office. about next steps.  The order was also re-faxed to [facility]  # [phone]  Attempted to contact the patient # [phone] an update, the phone just rang, no option to leave a message.

## 2020-06-18 ENCOUNTER — Telehealth: Payer: Self-pay | Admitting: Family Medicine

## 2020-06-18 NOTE — Telephone Encounter (Signed)
Yes please. Thanks. 

## 2020-06-18 NOTE — Telephone Encounter (Signed)
Called patient and gave her the number to reschedule injection at [facility].

## 2020-06-18 NOTE — Telephone Encounter (Signed)
Called [phone] wound practice, unable to reach, left VM to call back.

## 2020-06-18 NOTE — Telephone Encounter (Signed)
Shana calling from [facility] wound care center is calling bc the pt missed an appt yesterday. Office has been calling the pt with no response. Wanting to know if wanting to keep the referral in Epic? Please advise CB- [phone]

## 2020-06-21 ENCOUNTER — Other Ambulatory Visit: Payer: Self-pay

## 2020-06-21 ENCOUNTER — Encounter (HOSPITAL_COMMUNITY): Payer: Self-pay | Admitting: Psychiatry

## 2020-06-21 ENCOUNTER — Other Ambulatory Visit (HOSPITAL_COMMUNITY): Payer: Self-pay | Admitting: Psychiatry

## 2020-06-21 ENCOUNTER — Ambulatory Visit (INDEPENDENT_AMBULATORY_CARE_PROVIDER_SITE_OTHER): Payer: 59 | Admitting: Psychiatry

## 2020-06-21 ENCOUNTER — Other Ambulatory Visit (HOSPITAL_COMMUNITY): Payer: Self-pay

## 2020-06-21 DIAGNOSIS — F250 Schizoaffective disorder, bipolar type: Secondary | ICD-10-CM

## 2020-06-21 MED ORDER — TOPIRAMATE 25 MG PO TABS: 25.0000 mg | ORAL_TABLET | Freq: Two times a day (BID) | ORAL | 0 refills | Status: DC
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
## 2020-06-22 ENCOUNTER — Other Ambulatory Visit (HOSPITAL_COMMUNITY): Payer: Self-pay | Admitting: Psychiatry

## 2020-06-23 ENCOUNTER — Telehealth: Payer: Self-pay

## 2020-06-23 ENCOUNTER — Other Ambulatory Visit: Payer: Self-pay | Admitting: Family Medicine

## 2020-06-23 ENCOUNTER — Telehealth: Payer: Self-pay | Admitting: Family Medicine

## 2020-06-23 DIAGNOSIS — G8929 Other chronic pain: Secondary | ICD-10-CM

## 2020-06-23 DIAGNOSIS — M5442 Lumbago with sciatica, left side: Secondary | ICD-10-CM

## 2020-06-23 MED ORDER — LIDOCAINE 5 % EX PTCH: 1.0000 | MEDICATED_PATCH | CUTANEOUS | 1 refills | Status: DC

## 2020-06-23 NOTE — Telephone Encounter (Signed)
Call received from [facility]. She explained that the patient received a power chair from [facility] in 2019 and she is not eligible for a new chair until 2024.  The only exception would be if she has a new diagnosis that would support the need for a more complex chair.  [facility] spoke to the patient and she told them that she has a pressure sore on the back of her leg.  Jackelyn Poling has not been able to find documentation in Epic  to support the need for this new chair.  I told her that I would check with Dr Margarita Rana to see if she has any additional  information about patient's status

## 2020-06-23 NOTE — Telephone Encounter (Signed)
She has a left heel ulcer for which she had been referred to wound care.  That would be the new diagnosis from her visit with me.

## 2020-06-23 NOTE — Telephone Encounter (Signed)
Patient is requesting pain patches for her legs.

## 2020-06-23 NOTE — Telephone Encounter (Signed)
Patient would like the nurse to call regarding a patch that she can put on her legs for pain that she use to take for her back.  Please advise and call patient to discuss at [phone]

## 2020-06-23 NOTE — Telephone Encounter (Signed)
Done

## 2020-06-24 ENCOUNTER — Telehealth (HOSPITAL_COMMUNITY): Payer: Self-pay

## 2020-06-24 NOTE — Telephone Encounter (Signed)
Writer called patient to inform her of her upcoming appointment. As I was talking with her, she stated that she needs her Prazosin 5mg  and  requested to have it sent in. She uses [facility] on [address]. Please review and advise. Thank you

## 2020-06-25 ENCOUNTER — Ambulatory Visit
Admission: RE | Admit: 2020-06-25 | Discharge: 2020-06-25 | Disposition: A | Discharge status: Home / Self Care | Payer: 59 | Source: Ambulatory Visit | Attending: Family Medicine | Admitting: Family Medicine

## 2020-06-25 ENCOUNTER — Other Ambulatory Visit: Payer: 59

## 2020-06-25 DIAGNOSIS — M5441 Lumbago with sciatica, right side: Secondary | ICD-10-CM

## 2020-06-25 DIAGNOSIS — G8929 Other chronic pain: Secondary | ICD-10-CM

## 2020-06-25 MED ORDER — BETAMETHASONE SOD PHOS & ACET 6 (3-3) MG/ML IJ SUSP
12.0000 mg | Freq: Once | INTRAMUSCULAR | Status: AC
  Administered 2020-06-25: 12 mg via INTRALESIONAL

## 2020-06-25 MED ORDER — IOPAMIDOL (ISOVUE-M 200) INJECTION 41%
1.0000 mL | Freq: Once | INTRAMUSCULAR | Status: AC
  Administered 2020-06-25: 1 mL via EPIDURAL

## 2020-06-25 NOTE — Discharge Instructions (Signed)
Post Procedure Spinal Discharge Instruction Sheet  You may resume a regular diet and any medications that you routinely take (including pain medications).  No driving day of procedure.  Light activity throughout the rest of the day.  Do not do any strenuous work, exercise, bending or lifting.  The day following the procedure, you can resume normal physical activity but you should refrain from exercising or physical therapy for at least three days thereafter.   Common Side Effects:  Headaches- take your usual medications as directed by your physician.  Increase your fluid intake.  Caffeinated beverages may be helpful.  Lie flat in bed until your headache resolves.  Restlessness or inability to sleep- you may have trouble sleeping for the next few days.  Ask your referring physician if you need any medication for sleep.  Facial flushing or redness- should subside within a few days.  Increased pain- a temporary increase in pain a day or two following your procedure is not unusual.  Take your pain medication as prescribed by your referring physician.  Leg cramps  Please contact our office at [phone] for the following symptoms: Fever greater than 100 degrees. Headaches unresolved with medication after 2-3 days. Increased swelling, pain, or redness at injection site.   Thank you for visiting [facility] today.   

## 2020-06-28 ENCOUNTER — Ambulatory Visit (HOSPITAL_COMMUNITY): Payer: 59 | Admitting: Psychiatry

## 2020-06-28 ENCOUNTER — Telehealth (HOSPITAL_COMMUNITY): Payer: 59 | Admitting: Psychiatry

## 2020-06-28 ENCOUNTER — Other Ambulatory Visit: Payer: Self-pay | Admitting: Family Medicine

## 2020-06-28 ENCOUNTER — Other Ambulatory Visit: Payer: Self-pay

## 2020-06-29 ENCOUNTER — Other Ambulatory Visit: Payer: Self-pay

## 2020-06-29 ENCOUNTER — Ambulatory Visit: Payer: 59 | Attending: Family Medicine | Admitting: Family Medicine

## 2020-06-29 NOTE — Telephone Encounter (Signed)
Regarding power chair order, Message sent to [facility] with this information from Dr Margarita Rana -  She has a left heel ulcer for which she had been referred to wound care.  That would be the new diagnosis from her visit with me.

## 2020-07-01 ENCOUNTER — Telehealth: Payer: Self-pay
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
## 2020-07-02 ENCOUNTER — Ambulatory Visit: Payer: Self-pay | Admitting: *Deleted

## 2020-07-02 ENCOUNTER — Telehealth: Payer: Self-pay | Admitting: Internal Medicine

## 2020-07-02 DIAGNOSIS — I5032 Chronic diastolic (congestive) heart failure: Secondary | ICD-10-CM

## 2020-07-02 NOTE — Telephone Encounter (Signed)
Reason for Disposition  [1] Numbness or tingling in one or both feet AND [2] is a chronic symptom (recurrent or ongoing AND present > 4 weeks)  Answer Assessment - Initial Assessment Questions 1. SYMPTOM: "What is the main symptom you are concerned about?" (e.g., weakness, numbness)     Increasing numbness in feet and ankles 2. ONSET: "When did this start?" (minutes, hours, days; while sleeping)     Months ago , could not give specific time 3. LAST NORMAL: "When was the last time you (the patient) were normal (no symptoms)?"     Na  4. PATTERN "Does this come and go, or has it been constant since it started?"  "Is it present now?"     Constant numbness  5. CARDIAC SYMPTOMS: "Have you had any of the following symptoms: chest pain, difficulty breathing, palpitations?"     No  6. NEUROLOGIC SYMPTOMS: "Have you had any of the following symptoms: headache, dizziness, vision loss, double vision, changes in speech, unsteady on your feet?"     Chronic issues with standing  7. OTHER SYMPTOMS: "Do you have any other symptoms?"     Swelling in feet , ankles and legs 8. PREGNANCY: "Is there any chance you are pregnant?" "When was your last menstrual period?"     na  Protocols used: Neurologic Deficit-A-AH

## 2020-07-02 NOTE — Telephone Encounter (Signed)
I spoke with patient. She reports swelling is getting better in her legs.  She complains of numbness from her ankles to her toes.  This has been going on for months but patient feels it is worsening. No numbness anywhere else.  Speech clear.  Feet are normal in color but patient states they feel cold.  Patient aware of upcoming echo appointment

## 2020-07-02 NOTE — Telephone Encounter (Signed)
PT is calling to let the doctor know she cannot feel her legs down to her toes

## 2020-07-02 NOTE — Telephone Encounter (Signed)
Patient called to say that she has numbness in her feet from her ankles down to her feel she can't feel anything anytime. Say that its going on a few months and it has gotten worse. Called patient to review symptoms. C/o ankles and feet numb and is more difficult to feel for months. Reports swelling in upper and lower extremities. Denies fever, chest pain, difficulty breathing. Patient reports she does not walk around and she has a caregiver if she needs food or water. No available appt. Encouraged patient if symptoms worsen over weekend to go to [facility] or ED for evaluation. Care advise given. Patient reports she is going to try to talk with cardiologist , Fransico Him. Patient verbalized  understanding of care advise and to call back or go to [facility] or ED if symptoms worsen.

## 2020-07-05 ENCOUNTER — Ambulatory Visit (HOSPITAL_COMMUNITY): Payer: 59 | Admitting: Psychiatry

## 2020-07-05 ENCOUNTER — Other Ambulatory Visit: Payer: Self-pay

## 2020-07-05 ENCOUNTER — Ambulatory Visit (INDEPENDENT_AMBULATORY_CARE_PROVIDER_SITE_OTHER): Payer: 59 | Admitting: Psychiatry

## 2020-07-05 ENCOUNTER — Encounter (HOSPITAL_COMMUNITY): Payer: Self-pay | Admitting: Psychiatry

## 2020-07-05 ENCOUNTER — Other Ambulatory Visit: Payer: Self-pay | Admitting: Family Medicine

## 2020-07-05 DIAGNOSIS — M109 Gout, unspecified: Secondary | ICD-10-CM

## 2020-07-05 DIAGNOSIS — F250 Schizoaffective disorder, bipolar type: Secondary | ICD-10-CM
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
## 2020-07-05 NOTE — Telephone Encounter (Signed)
FYI

## 2020-07-05 NOTE — Telephone Encounter (Signed)
  Notes to clinic: medication requested is not on current medication list  Review for continued use   Requested Prescriptions  Pending Prescriptions Disp Refills   febuxostat (ULORIC) 40 MG tablet [Pharmacy Med Name: FEBUXOSTAT 40MG  TABLETS] 90 tablet 0    Sig: TAKE 1 TABLET(40 MG) BY MOUTH DAILY      Endocrinology: Gout Agents - febuxostat & probenecid Passed - 07/05/2020  9:23 AM      Passed - Uric Acid in normal range and within 360 days    Uric Acid, Serum  Date Value Ref Range Status  02/18/2020 3.0 2.5 - 7.1 mg/dL Final    Comment:    Performed at [facility], [place] [address] [place], [name] [address]          Passed - Valid encounter within last 12 months    Recent Outpatient Visits           1 month ago Heel ulcer, left, with fat layer exposed [facility])   [place] [name], MD   1 month ago Edema of both legs   [place], Deborah B, MD   3 months ago Pelvic mass in female   [place], [place]ong, MD   6 months ago Overactive bladder   [place], MD   1 year ago Refused assessment of physical health   [facility], Delfino Lovett, NP       Future Appointments             In 1 month [name], MD [place]   In 1 month [name], Event organiser T, PA-C [place], [facility]

## 2020-07-06 NOTE — Telephone Encounter (Signed)
Numbness may be related to back issues    I would recomm that she contact her orthopedist

## 2020-07-06 NOTE — Telephone Encounter (Signed)
I have informed the patient and she will contact her back doctor to discuss the numbness in ankles to toes.    We reviewed recent labs and recommendations from Dr. Harrington Challenger.    [name], MD  Rodman Key, RN Fluid is much improved     Kidney function is relatively stable  Keep on same meds      Repeat in 2 wks to confirm stability in kidney function and to follow fluid  Hold on echo for now since improving   Just had in APril  (cancel)    I have cancelled the echo but she will come that same day (6/28) for the repeat labs (BMET, BNP) since she already has transportation arranged.  Reminded pt have her transportation wait for her since it is just labs and no echo.  She voices understanding of the above.

## 2020-07-12 ENCOUNTER — Other Ambulatory Visit: Payer: Self-pay

## 2020-07-12 ENCOUNTER — Ambulatory Visit (INDEPENDENT_AMBULATORY_CARE_PROVIDER_SITE_OTHER): Payer: 59 | Admitting: Psychiatry

## 2020-07-12 ENCOUNTER — Telehealth (HOSPITAL_COMMUNITY): Payer: Self-pay

## 2020-07-12 DIAGNOSIS — F250 Schizoaffective disorder, bipolar type: Secondary | ICD-10-CM | POA: Diagnosis not present

## 2020-07-12 DIAGNOSIS — F4310 Post-traumatic stress disorder, unspecified: Secondary | ICD-10-CM | POA: Diagnosis not present
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
## 2020-07-12 NOTE — Telephone Encounter (Signed)
Spoke with pharmacist regarding patient's Prazosin 5mg . They stated that she already has one on file that they haven't filled. They're going to fill that one and notify the patient

## 2020-07-12 NOTE — Telephone Encounter (Signed)
none

## 2020-07-13 ENCOUNTER — Other Ambulatory Visit (HOSPITAL_COMMUNITY): Payer: 59

## 2020-07-13 ENCOUNTER — Other Ambulatory Visit: Payer: Self-pay

## 2020-07-13 ENCOUNTER — Other Ambulatory Visit: Payer: 59 | Admitting: *Deleted

## 2020-07-13 DIAGNOSIS — I5032 Chronic diastolic (congestive) heart failure: Secondary | ICD-10-CM

## 2020-07-14 ENCOUNTER — Telehealth: Payer: Self-pay | Admitting: Physician Assistant

## 2020-07-14 DIAGNOSIS — I5032 Chronic diastolic (congestive) heart failure: Secondary | ICD-10-CM

## 2020-07-14 DIAGNOSIS — E876 Hypokalemia: Secondary | ICD-10-CM

## 2020-07-14 LAB — BASIC METABOLIC PANEL
BUN/Creatinine Ratio: 10 — ABNORMAL LOW (ref 12–28)
BUN: 24 mg/dL (ref 8–27)
CO2: 27 mmol/L (ref 20–29)
Calcium: 8.4 mg/dL — ABNORMAL LOW (ref 8.7–10.3)
Chloride: 86 mmol/L — ABNORMAL LOW (ref 96–106)
Creatinine, Ser: 2.52 mg/dL — ABNORMAL HIGH (ref 0.57–1.00)
Glucose: 85 mg/dL (ref 65–99)
Potassium: 2.3 mmol/L — CL (ref 3.5–5.2)
Sodium: 131 mmol/L — ABNORMAL LOW (ref 134–144)
eGFR: 21 mL/min/{1.73_m2} — ABNORMAL LOW (ref >59)

## 2020-07-14 LAB — PRO B NATRIURETIC PEPTIDE: NT-Pro BNP: 201 pg/mL (ref 0–287)

## 2020-07-14 MED ORDER — POTASSIUM CHLORIDE CRYS ER 20 MEQ PO TBCR: 40.0000 meq | EXTENDED_RELEASE_TABLET | Freq: Two times a day (BID) | ORAL | 3 refills | Status: DC

## 2020-07-14 NOTE — Telephone Encounter (Signed)
New message:     [facility] calling for a critical lab

## 2020-07-14 NOTE — Telephone Encounter (Signed)
Spoke with pt in regards to low potassium of 2.3.  Reviewed DOD instructions with patient.  Please see previous note.  Pt told to increase potassium to 40 mEq PO BID, hold metolazone, and recheck BMP on Tuesday 07/20/20 as our office is closed on Monday 07/19/20. She states she understands instructions.

## 2020-07-14 NOTE — Telephone Encounter (Signed)
Add BNP and Mg to labs on Tuesday

## 2020-07-14 NOTE — Telephone Encounter (Signed)
Received call from [facility] with critical potassium 2.3.  Per DOD-have Pt hold metolazone.  Confirm current potassium prescription.  If Pt is taking potassium 40 meq PO BID-will increase to 60 mg PO BID.  Repeat BMP in 3 days.  Attempted outreach to Pt.  Call rings and rings-does not go to VM.  No recent DPR on file.  Attempted to call daughter listed as Pt contact.  Number on file is incorrect.  Will continue to try to contact Pt.

## 2020-07-15 ENCOUNTER — Encounter (HOSPITAL_COMMUNITY): Payer: Self-pay | Admitting: Psychiatry

## 2020-07-15 ENCOUNTER — Inpatient Hospital Stay: Admission: RE | Admit: 2020-07-15 | Payer: 59 | Source: Ambulatory Visit

## 2020-07-15 NOTE — Addendum Note (Signed)
Addended by: Willeen Cass A on: 07/15/2020 07:11 AM   Modules accepted: Orders

## 2020-07-15 NOTE — Telephone Encounter (Signed)
Addl labs added-BNP and mag

## 2020-07-16 ENCOUNTER — Telehealth: Payer: Self-pay | Admitting: Family Medicine

## 2020-07-16 ENCOUNTER — Ambulatory Visit: Payer: Self-pay | Admitting: *Deleted

## 2020-07-16 NOTE — Telephone Encounter (Signed)
It looks like this patient has a therapist with outpatient [place]. I'm going to check in with her first.  [facility],  Are you seeing this pt regulary?
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
## 2020-07-16 NOTE — Telephone Encounter (Signed)
Please assist patient.  [place] FYI
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
## 2020-07-20 ENCOUNTER — Other Ambulatory Visit: Payer: 59

## 2020-07-22 ENCOUNTER — Other Ambulatory Visit: Payer: Self-pay

## 2020-07-22 ENCOUNTER — Inpatient Hospital Stay (HOSPITAL_COMMUNITY)
Admission: EM | Admit: 2020-07-22 | Discharge: 2020-07-28 | DRG: 640 | Disposition: A | Discharge status: Home Health | Payer: 59 | Attending: Family Medicine | Admitting: Family Medicine

## 2020-07-22 ENCOUNTER — Emergency Department (HOSPITAL_COMMUNITY): Payer: 59

## 2020-07-22 ENCOUNTER — Encounter (HOSPITAL_COMMUNITY): Payer: Self-pay

## 2020-07-22 DIAGNOSIS — R112 Nausea with vomiting, unspecified: Secondary | ICD-10-CM | POA: Diagnosis present

## 2020-07-22 DIAGNOSIS — Z87891 Personal history of nicotine dependence: Secondary | ICD-10-CM

## 2020-07-22 DIAGNOSIS — R9431 Abnormal electrocardiogram [ECG] [EKG]: Secondary | ICD-10-CM | POA: Diagnosis present

## 2020-07-22 DIAGNOSIS — J439 Emphysema, unspecified: Secondary | ICD-10-CM | POA: Diagnosis present

## 2020-07-22 DIAGNOSIS — I5032 Chronic diastolic (congestive) heart failure: Secondary | ICD-10-CM | POA: Diagnosis present

## 2020-07-22 DIAGNOSIS — I130 Hypertensive heart and chronic kidney disease with heart failure and stage 1 through stage 4 chronic kidney disease, or unspecified chronic kidney disease: Secondary | ICD-10-CM | POA: Diagnosis present

## 2020-07-22 DIAGNOSIS — Z8674 Personal history of sudden cardiac arrest: Secondary | ICD-10-CM

## 2020-07-22 DIAGNOSIS — G894 Chronic pain syndrome: Secondary | ICD-10-CM | POA: Diagnosis present

## 2020-07-22 DIAGNOSIS — F1421 Cocaine dependence, in remission: Secondary | ICD-10-CM | POA: Diagnosis present

## 2020-07-22 DIAGNOSIS — F250 Schizoaffective disorder, bipolar type: Secondary | ICD-10-CM | POA: Diagnosis present

## 2020-07-22 DIAGNOSIS — F4312 Post-traumatic stress disorder, chronic: Secondary | ICD-10-CM | POA: Diagnosis present

## 2020-07-22 DIAGNOSIS — Z9049 Acquired absence of other specified parts of digestive tract: Secondary | ICD-10-CM

## 2020-07-22 DIAGNOSIS — K219 Gastro-esophageal reflux disease without esophagitis: Secondary | ICD-10-CM | POA: Diagnosis present

## 2020-07-22 DIAGNOSIS — F5081 Binge eating disorder: Secondary | ICD-10-CM | POA: Diagnosis present

## 2020-07-22 DIAGNOSIS — E861 Hypovolemia: Secondary | ICD-10-CM | POA: Diagnosis present

## 2020-07-22 DIAGNOSIS — J9611 Chronic respiratory failure with hypoxia: Secondary | ICD-10-CM | POA: Diagnosis present

## 2020-07-22 DIAGNOSIS — Z885 Allergy status to narcotic agent status: Secondary | ICD-10-CM

## 2020-07-22 DIAGNOSIS — E878 Other disorders of electrolyte and fluid balance, not elsewhere classified: Secondary | ICD-10-CM | POA: Diagnosis present

## 2020-07-22 DIAGNOSIS — E785 Hyperlipidemia, unspecified: Secondary | ICD-10-CM | POA: Diagnosis present

## 2020-07-22 DIAGNOSIS — E1122 Type 2 diabetes mellitus with diabetic chronic kidney disease: Secondary | ICD-10-CM | POA: Diagnosis present

## 2020-07-22 DIAGNOSIS — E1140 Type 2 diabetes mellitus with diabetic neuropathy, unspecified: Secondary | ICD-10-CM | POA: Diagnosis present

## 2020-07-22 DIAGNOSIS — N1832 Chronic kidney disease, stage 3b: Secondary | ICD-10-CM | POA: Diagnosis present

## 2020-07-22 DIAGNOSIS — E876 Hypokalemia: Secondary | ICD-10-CM | POA: Diagnosis present

## 2020-07-22 DIAGNOSIS — E871 Hypo-osmolality and hyponatremia: Principal | ICD-10-CM | POA: Diagnosis present

## 2020-07-22 DIAGNOSIS — Z9981 Dependence on supplemental oxygen: Secondary | ICD-10-CM

## 2020-07-22 DIAGNOSIS — N179 Acute kidney failure, unspecified: Secondary | ICD-10-CM | POA: Diagnosis present

## 2020-07-22 DIAGNOSIS — N3000 Acute cystitis without hematuria: Secondary | ICD-10-CM | POA: Diagnosis present

## 2020-07-22 DIAGNOSIS — I252 Old myocardial infarction: Secondary | ICD-10-CM

## 2020-07-22 DIAGNOSIS — M1990 Unspecified osteoarthritis, unspecified site: Secondary | ICD-10-CM | POA: Diagnosis present

## 2020-07-22 DIAGNOSIS — B9620 Unspecified Escherichia coli [E. coli] as the cause of diseases classified elsewhere: Secondary | ICD-10-CM | POA: Diagnosis present

## 2020-07-22 DIAGNOSIS — G4733 Obstructive sleep apnea (adult) (pediatric): Secondary | ICD-10-CM | POA: Diagnosis present

## 2020-07-22 DIAGNOSIS — W19XXXA Unspecified fall, initial encounter: Secondary | ICD-10-CM | POA: Diagnosis present

## 2020-07-22 DIAGNOSIS — R1900 Intra-abdominal and pelvic swelling, mass and lump, unspecified site: Secondary | ICD-10-CM

## 2020-07-22 DIAGNOSIS — L89152 Pressure ulcer of sacral region, stage 2: Secondary | ICD-10-CM | POA: Diagnosis present

## 2020-07-22 DIAGNOSIS — Z20822 Contact with and (suspected) exposure to covid-19: Secondary | ICD-10-CM | POA: Diagnosis present

## 2020-07-22 DIAGNOSIS — Z8249 Family history of ischemic heart disease and other diseases of the circulatory system: Secondary | ICD-10-CM

## 2020-07-22 DIAGNOSIS — R197 Diarrhea, unspecified: Secondary | ICD-10-CM | POA: Diagnosis present

## 2020-07-22 DIAGNOSIS — Z79899 Other long term (current) drug therapy: Secondary | ICD-10-CM

## 2020-07-22 DIAGNOSIS — L89322 Pressure ulcer of left buttock, stage 2: Secondary | ICD-10-CM | POA: Diagnosis present

## 2020-07-22 DIAGNOSIS — R296 Repeated falls: Secondary | ICD-10-CM | POA: Diagnosis present

## 2020-07-22 DIAGNOSIS — D631 Anemia in chronic kidney disease: Secondary | ICD-10-CM | POA: Diagnosis present

## 2020-07-22 DIAGNOSIS — E872 Acidosis, unspecified: Secondary | ICD-10-CM

## 2020-07-22 DIAGNOSIS — Z882 Allergy status to sulfonamides status: Secondary | ICD-10-CM

## 2020-07-22 DIAGNOSIS — Z818 Family history of other mental and behavioral disorders: Secondary | ICD-10-CM

## 2020-07-22 DIAGNOSIS — G9341 Metabolic encephalopathy: Secondary | ICD-10-CM | POA: Diagnosis present

## 2020-07-22 DIAGNOSIS — R64 Cachexia: Secondary | ICD-10-CM | POA: Diagnosis present

## 2020-07-22 DIAGNOSIS — Z888 Allergy status to other drugs, medicaments and biological substances status: Secondary | ICD-10-CM

## 2020-07-22 DIAGNOSIS — Z809 Family history of malignant neoplasm, unspecified: Secondary | ICD-10-CM

## 2020-07-22 DIAGNOSIS — Z681 Body mass index (BMI) 19 or less, adult: Secondary | ICD-10-CM

## 2020-07-22 DIAGNOSIS — Z90710 Acquired absence of both cervix and uterus: Secondary | ICD-10-CM

## 2020-07-22 DIAGNOSIS — Z8673 Personal history of transient ischemic attack (TIA), and cerebral infarction without residual deficits: Secondary | ICD-10-CM

## 2020-07-22 DIAGNOSIS — Z91410 Personal history of adult physical and sexual abuse: Secondary | ICD-10-CM

## 2020-07-22 DIAGNOSIS — E860 Dehydration: Secondary | ICD-10-CM | POA: Diagnosis present

## 2020-07-22 DIAGNOSIS — L98429 Non-pressure chronic ulcer of back with unspecified severity: Secondary | ICD-10-CM

## 2020-07-22 LAB — CBC WITH DIFFERENTIAL/PLATELET
Abs Immature Granulocytes: 0.07 10*3/uL (ref 0.00–0.07)
Basophils Absolute: 0 10*3/uL (ref 0.0–0.1)
Basophils Relative: 0 %
Eosinophils Absolute: 0 10*3/uL (ref 0.0–0.5)
Eosinophils Relative: 1 %
HCT: 34.7 % — ABNORMAL LOW (ref 36.0–46.0)
Hemoglobin: 12.4 g/dL (ref 12.0–15.0)
Immature Granulocytes: 3 %
Lymphocytes Relative: 26 %
Lymphs Abs: 0.7 10*3/uL (ref 0.7–4.0)
MCH: 28.4 pg (ref 26.0–34.0)
MCHC: 35.7 g/dL (ref 30.0–36.0)
MCV: 79.6 fL — ABNORMAL LOW (ref 80.0–100.0)
Monocytes Absolute: 0.2 10*3/uL (ref 0.1–1.0)
Monocytes Relative: 8 %
Neutro Abs: 1.7 10*3/uL (ref 1.7–7.7)
Neutrophils Relative %: 62 %
Platelets: 255 10*3/uL (ref 150–400)
RBC: 4.36 MIL/uL (ref 3.87–5.11)
RDW: 13.2 % (ref 11.5–15.5)
WBC: 2.7 10*3/uL — ABNORMAL LOW (ref 4.0–10.5)
nRBC: 0 % (ref 0.0–0.2)

## 2020-07-22 LAB — COMPREHENSIVE METABOLIC PANEL
ALT: 19 U/L (ref 0–44)
AST: 61 U/L — ABNORMAL HIGH (ref 15–41)
Albumin: 4 g/dL (ref 3.5–5.0)
Alkaline Phosphatase: 175 U/L — ABNORMAL HIGH (ref 38–126)
Anion gap: 18 — ABNORMAL HIGH (ref 5–15)
BUN: 28 mg/dL — ABNORMAL HIGH (ref 8–23)
CO2: 26 mmol/L (ref 22–32)
Calcium: 7.5 mg/dL — ABNORMAL LOW (ref 8.9–10.3)
Chloride: 69 mmol/L — ABNORMAL LOW (ref 98–111)
Creatinine, Ser: 2.75 mg/dL — ABNORMAL HIGH (ref 0.44–1.00)
GFR, Estimated: 19 mL/min — ABNORMAL LOW (ref >60)
Glucose, Bld: 81 mg/dL (ref 70–99)
Potassium: 2.2 mmol/L — CL (ref 3.5–5.1)
Sodium: 113 mmol/L — CL (ref 135–145)
Total Bilirubin: 0.8 mg/dL (ref 0.3–1.2)
Total Protein: 7.3 g/dL (ref 6.5–8.1)

## 2020-07-22 LAB — LIPASE, BLOOD: Lipase: 23 U/L (ref 11–51)

## 2020-07-22 MED ORDER — SODIUM CHLORIDE 0.9 % IV BOLUS
500.0000 mL | Freq: Once | INTRAVENOUS | Status: AC
  Administered 2020-07-23: 500 mL via INTRAVENOUS

## 2020-07-22 MED ORDER — SODIUM CHLORIDE 0.9 % IV BOLUS: 1000.0000 mL | Freq: Once | INTRAVENOUS | Status: DC

## 2020-07-22 MED ORDER — SODIUM CHLORIDE 0.9 % IV BOLUS
500.0000 mL | Freq: Once | INTRAVENOUS | Status: DC
Start: 2020-07-22 — End: 2020-07-22

## 2020-07-22 MED ORDER — MAGNESIUM SULFATE 2 GM/50ML IV SOLN
2.0000 g | Freq: Once | INTRAVENOUS | Status: AC
  Administered 2020-07-23: 2 g via INTRAVENOUS
  Filled 2020-07-22: qty 50

## 2020-07-22 MED ORDER — SODIUM CHLORIDE 0.9 % IV SOLN: Freq: Once | INTRAVENOUS | Status: AC

## 2020-07-22 NOTE — ED Provider Notes (Signed)
[facility] DEPT Provider Note   CSN: [id number] Arrival date & time: 07/22/20  1934     History Chief Complaint  Patient presents with   Abdominal Pain    Tiffany [name] is a 60 y.o. female with a history of diabetes mellitus type 2, HTN, schizoaffective disorder, asthma, COPD, chronic respiratory failure on home O2, chronic diastolic CHF, PEA cardiac arrest in 2018, CKD, chronic pain syndrome, PTSD, binge eating disorder who presents the emergency department by EMS with a chief complaint of vomiting.  The patient reports that she has been having multiple episodes of vomiting for the last week accompanied by frequent diarrhea for the last 2 to 3 weeks.  She reports that she has been unable to eat or drink due to her symptoms.  She also endorses left lower quadrant abdominal pain, which she is unable to characterize, and dysuria.  She reports that she has been falling frequently since her symptoms began.  She last fell today.  She has hit her head several times.  She denies loss of consciousness with any of the falls.  She ambulates with a walker at baseline.  She denies fever, chills, chest pain.  [name] endorsing mild intermittent shortness of breath.  Patient is alert and oriented x3, but does have some confusion.  [name] a very poor historian.  The history is provided by the patient and medical records. No language interpreter was used.      Past Medical History:  Diagnosis Date   Agitation 11/22/2017   Anoxic brain injury ([place]) 09/08/2016   C. Arrest due to respiratory failure and COPD exacerbation   Anxiety    Arthritis    "all over" (04/10/2016)   Asthma 10/18/2010   Binge eating disorder    Cardiac arrest ([place]) 09/08/2016   PEA   Carotid artery stenosis    1-39% bilateral by dopplers 11/2016   Chronic diastolic (congestive) heart failure ([place])    Chronic kidney disease    "I see a kidney dr." (04/10/2016)   Chronic pain syndrome 06/18/2012    Chronic post-traumatic stress disorder (PTSD) 05/27/2018   Chronic respiratory failure with hypoxia and hypercapnia ([place]) 06/22/2015   TRILOGY Vent >AVAPA-ES., Vt target 200-400, Max P 30 , PS max 20 , PS min 6-10 , E Max 6, E Min 4, Rate Auto AVAPS Rate 2 (titrate for pt comfort) , bleed O2 at 5l/m continuous flow .    CKD (chronic kidney disease) stage 3, GFR 30-59 ml/min (HCC) 12/15/2016   Closed displaced fracture of fifth metacarpal bone 03/21/2018   Cocaine use disorder, severe, in sustained remission ([place]) 40/09/8117   Complication of anesthesia    decreased bp, decreased heart rate   COPD (chronic obstructive pulmonary disease) ([place]) 07/08/2014   Depression    Diabetic neuropathy ([place]) 04/24/2011   Difficulty with speech 01/24/2018   Disorder of nervous system    Drug abuse ([place]) 11/21/2017   Dyslipidemia 04/24/2011   Elevated troponin 04/28/2012   Emphysema    Encephalopathy 11/21/2017   Essential hypertension 03/22/2016   Fibula fracture 07/10/2016   Frequent falls 10/11/2017   GERD (gastroesophageal reflux disease)    Gout 04/11/2017   Heart attack ([place]) 1980s   History of blood transfusion 1994   "couldn't stop bleeding from my period"   History of drug abuse in remission ([place]) 11/28/2015   Quit in 2017   Hyperlipidemia LDL goal <70    Incontinence    Manic depression ([place])  Morbid obesity ([place]) 10/18/2010   Obstructive sleep apnea 10/18/2010   On home oxygen therapy    "6L; 24/7" (04/10/2016)   OSA on CPAP    "wear mask sometimes" (04/10/2016)   Paranoid ([place])    "sometimes; I'm on RX for it" (04/10/2016)   Prolonged Q-T interval on ECG    Rectal bleeding 12/31/2015   Right carotid bruit 11/09/2016   Schizoaffective disorder, bipolar type ([place]) 04/05/2018   Seasonal allergies    Seborrheic keratoses 12/31/2013   Seizures ([place])    "don't know what kind; last one was ~ 1 yr ago" (04/10/2016)   Stroke [facility]) 1980s   denies residual on 04/10/2016   Thrush 09/19/2013   Type 2 diabetes  mellitus ([place]) 10/18/2010    Patient Active Problem List   Diagnosis Date Noted   Protein-calorie malnutrition, severe 05/15/2020   Malnutrition of moderate degree 37/10/6267   Periumbilical abdominal pain    Dysphagia    Nausea vomiting and diarrhea    Generalized abdominal pain    E. coli UTI 48/54/6270   Acute metabolic encephalopathy 35/00/9381   Psychosis ([place]) 02/20/2020   Schizophrenia, schizo-affective ([place]) 82/99/3716   Metabolic acidosis 96/78/9381   Altered mental status    Hypocalcemia    AKI (acute kidney injury) ([place]) 02/18/2020   Pelvic mass in female 01/02/2020   Genital herpes 11/25/2019   Pressure injury of skin 10/28/2019   ARF (acute renal failure) ([place]) 10/27/2019   Family discord 02/04/2019   Aggressive behavior    PTSD (post-traumatic stress disorder) 05/27/2018   Schizoaffective disorder, bipolar type ([place]) 04/05/2018   Closed displaced fracture of fifth metacarpal bone 03/21/2018   Difficulty with speech 01/24/2018   Encephalopathy 11/21/2017   Drug abuse ([place]) 11/21/2017   Frequent falls 10/11/2017   Binge eating disorder    Dependence on continuous supplemental oxygen 05/14/2017   Gout 04/11/2017   Hypomagnesemia    CKD (chronic kidney disease) stage 3, GFR 30-59 ml/min (HCC) 12/15/2016   Carotid artery stenosis    Hypokalemia    Osteoarthritis 10/26/2016   Anoxic brain injury ([place]) 09/08/2016   Overactive bladder 06/07/2016   QT prolongation    OSA and COPD overlap syndrome ([place])    Arthritis    Essential hypertension 03/22/2016   Cocaine use disorder, severe, in sustained remission ([place]) 12/17/2015   History of drug abuse in remission ([place]) 11/28/2015   Hyponatremia 11/25/2015   Chronic diastolic congestive heart failure (HCC)    Chronic respiratory failure with hypoxia and hypercapnia ([place]) 06/22/2015   Tobacco use disorder 07/22/2014   COPD (chronic obstructive pulmonary disease) ([place]) 07/08/2014   Seizure ([place]) 01/04/2013   Chronic  pain syndrome 06/18/2012   Dyslipidemia 04/24/2011   Diabetic neuropathy ([place]) 04/24/2011   Morbid obesity ([place]) 10/18/2010   Type 2 diabetes mellitus ([place]) 10/18/2010    Past Surgical History:  Procedure Laterality Date   CESAREAN SECTION  1997   HERNIA REPAIR     IR CHOLANGIOGRAM EXISTING TUBE  07/20/2016   IR PERC CHOLECYSTOSTOMY  05/10/2016   IR RADIOLOGIST EVAL & MGMT  06/08/2016   IR RADIOLOGIST EVAL & MGMT  06/29/2016   IR SINUS/FIST TUBE CHK-NON GI  07/12/2016   RIGHT/LEFT HEART CATH AND CORONARY ANGIOGRAPHY N/A 06/19/2017   Procedure: RIGHT/LEFT HEART CATH AND CORONARY ANGIOGRAPHY;  Surgeon: Jolaine Artist, MD;  Location: [facility];  Service: Cardiovascular;  Laterality: N/A;   TIBIA IM NAIL INSERTION Right 07/12/2016   Procedure: INTRAMEDULLARY (IM) NAIL  RIGHT TIBIA;  Surgeon: Leandrew Koyanagi, MD;  Location: [facility];  Service: Orthopedics;  Laterality: Right;   UMBILICAL HERNIA REPAIR  ~ 1963   "that's why I don't have a belly button"   VAGINAL HYSTERECTOMY       OB History     Gravida  4   Para  4   Term  3   Preterm  1   AB  0   Living  3      SAB  0   IAB  0   Ectopic  0   Multiple  0   Live Births  3           Family History  Problem Relation Age of Onset   Cancer Father        prostate   Cancer Mother        lung   Depression Mother    Depression Sister    Anxiety disorder Sister    Schizophrenia Sister    Bipolar disorder Sister    Depression Sister    Depression Brother    Heart failure Other        cousin    Social History   Tobacco Use   Smoking status: Former    Packs/day: 1.50    Years: 38.00    Pack years: 57.00    Types: Cigarettes    Start date: 03/13/1977    Quit date: 04/10/2016    Years since quitting: 4.2   Smokeless tobacco: Never  Vaping Use   Vaping Use: Never used  Substance Use Topics   Alcohol use: No    Alcohol/week: 0.0 standard drinks   Drug use: No    Types: Cocaine    Comment: 04/10/2016  "last used cocaine back in November 2017"    Home Medications Prior to Admission medications   Medication Sig Start Date End Date Taking? Authorizing Provider  albuterol (PROVENTIL) (2.5 MG/3ML) 0.083% nebulizer solution Take 3 mLs (2.5 mg total) by nebulization every 6 (six) hours as needed for wheezing or shortness of breath. 03/17/20   Collene Gobble, MD  albuterol (VENTOLIN HFA) 108 (90 Base) MCG/ACT inhaler Inhale 2 puffs into the lungs every 6 (six) hours as needed for wheezing or shortness of breath. 03/17/20   Collene Gobble, MD  asenapine (SAPHRIS) 5 MG SUBL 24 hr tablet Place 2 tablets (10 mg total) under the tongue 2 (two) times daily. 05/31/20   Cobos, Myer Peer, MD  diazepam (VALIUM) 10 MG tablet Take 1 tablet (10 mg total) by mouth 3 (three) times daily as needed for anxiety. 05/31/20 08/29/20  Cobos, Myer Peer, MD  diclofenac (VOLTAREN) 75 MG EC tablet TAKE 1 TABLET(75 MG) BY MOUTH TWICE DAILY AS NEEDED 06/28/20   Hilts, Legrand Como, MD  diclofenac Sodium (VOLTAREN) 1 % GEL Apply 4 g topically 4 (four) times daily. 05/21/20   Ladell Pier, MD  escitalopram (LEXAPRO) 10 MG tablet Take 1 tablet (10 mg total) by mouth daily. 05/20/20 05/20/21  Cobos, Myer Peer, MD  feeding supplement (ENSURE ENLIVE / ENSURE PLUS) LIQD Take 237 mLs by mouth 2 (two) times daily between meals. 02/26/20   Hongalgi, Lenis Dickinson, MD  Fluticasone-Umeclidin-Vilant (TRELEGY ELLIPTA) 100-62.5-25 MCG/INH AEPB Inhale 1 puff into the lungs daily. 03/17/20   Collene Gobble, MD  furosemide (LASIX) 80 MG tablet Take 1 tablet (80 mg total) by mouth daily. 06/07/20   [name], MD  gabapentin (NEURONTIN) 600 MG tablet Take 0.5  tablets (300 mg total) by mouth 2 (two) times daily. 05/16/20   Nita Sells, MD  lidocaine (LIDODERM) 5 % Place 1 patch onto the skin daily. Remove & Discard patch within 12 hours or as directed by MD 06/23/20   [name], MD  lisdexamfetamine (VYVANSE) 70 MG capsule Take 70 mg by mouth daily.     [provider]  Misc. Devices Foley Power wheelchair.  Diagnosis-frequent falls, bilateral knee osteoarthritis 05/31/20   [name], MD  montelukast (SINGULAIR) 10 MG tablet Take 1 tablet (10 mg total) by mouth at bedtime. 05/31/20   [name], MD  omeprazole (PRILOSEC) 40 MG capsule Take 1 capsule (40 mg total) by mouth daily. 05/31/20   [name], MD  oxybutynin (DITROPAN) 5 MG tablet TAKE 1 TABLET(5 MG) BY MOUTH TWICE DAILY 05/26/20   [name], MD  potassium chloride SA (KLOR-CON) 20 MEQ tablet Take 2 tablets (40 mEq total) by mouth 2 (two) times daily. 07/14/20   [name], MD  prazosin (MINIPRESS) 5 MG capsule Take 5 mg by mouth 2 (two) times daily.    [provider]  silver sulfADIAZINE (SILVADENE) 1 % cream Apply 1 application topically 2 (two) times daily. 05/31/20   [name], MD  thiamine 100 MG tablet Take 1 tablet (100 mg total) by mouth daily. 02/27/20   Hongalgi, Lenis Dickinson, MD  tiZANidine (ZANAFLEX) 4 MG tablet Take 1 tablet (4 mg total) by mouth every 8 (eight) hours as needed for muscle spasms. 05/25/20   [name], MD  topiramate (TOPAMAX) 25 MG tablet Take 1 tablet (25 mg total) by mouth 2 (two) times daily. 06/21/20 06/21/21  Cobos, Myer Peer, MD    Allergies    Hydrocodone, Hydroxyzine, Latuda [lurasidone hcl], Magnesium-containing compounds, Prednisone, Tramadol, Codeine, Sulfa antibiotics, and Tape  Review of Systems   Review of Systems  Constitutional:  Negative for activity change, chills, diaphoresis and fever.  HENT:  Negative for congestion and sore throat.   Eyes:  Negative for visual disturbance.  Respiratory:  Positive for shortness of breath. Negative for cough.   Cardiovascular:  Negative for chest pain.  Gastrointestinal:  Positive for abdominal pain, diarrhea, nausea and vomiting.  Genitourinary:  Positive for dysuria. Negative for vaginal pain.  Musculoskeletal:  Positive for arthralgias and myalgias. Negative  for back pain, neck pain and neck stiffness.  Skin:  Positive for wound. Negative for color change and rash.  Allergic/Immunologic: Negative for immunocompromised state.  Neurological:  Positive for weakness and headaches. Negative for dizziness, seizures, syncope and numbness.  Psychiatric/Behavioral:  Negative for confusion.    Physical Exam Updated Vital Signs BP (!) 157/114   Pulse 68   Temp 98.3 F (36.8 C)   Resp 13   Ht 5' 4.5" (1.638 m)   Wt 40.8 kg   SpO2 100%   BMI 15.21 kg/m   Physical Exam Vitals and nursing note reviewed.  Constitutional:      General: [name] not in acute distress.    Appearance: [name] not toxic-appearing.     Comments: Chronically ill-appearing.  Alert.  HENT:     Head: Normocephalic.     Comments: Temporal wasting    Mouth/Throat:     Mouth: Mucous membranes are dry.  Eyes:     Conjunctiva/sclera: Conjunctivae normal.  Cardiovascular:     Rate and Rhythm: Normal rate and regular rhythm.     Heart sounds: No murmur heard.   No friction rub. No gallop.  Comments: 1+ peripheral pulses bilaterally Pulmonary:     Effort: Pulmonary effort is normal. No respiratory distress.     Breath sounds: No stridor. No wheezing, rhonchi or rales.  Chest:     Chest wall: No tenderness.  Abdominal:     General: There is no distension.     Palpations: Abdomen is soft.     Tenderness: There is abdominal tenderness.     Comments: Tender to palpation along left lower quadrant without rebound or guarding.  Abdomen is soft and nondistended.  Hypoactive bowel sounds in all 4 quadrants.  No CVA tenderness bilaterally.  Negative Murphy sign.  No tenderness over McBurney's point.  Musculoskeletal:     Cervical back: Neck supple.  Skin:    General: Skin is warm.     Capillary Refill: Capillary refill takes more than 3 seconds.     Findings: No rash.  Neurological:     Mental Status: [name] alert.     Comments: Alert and oriented x3.  Good grip strength  bilaterally of the bilateral upper extremities.  Decreased strength against resistance of the bilateral lower extremities.  [name] unable to lift her legs off the bed.  Answers questions appropriately, but speech is very slowed and she does have confusion about some details.  Psychiatric:        Behavior: Behavior normal.    ED Results / Procedures / Treatments   Labs (all labs ordered are listed, but only abnormal results are displayed) Labs Reviewed  COMPREHENSIVE METABOLIC PANEL - Abnormal; Notable for the following components:      Result Value   Sodium 113 (*)    Potassium 2.2 (*)    Chloride 69 (*)    BUN 28 (*)    Creatinine, Ser 2.75 (*)    Calcium 7.5 (*)    AST 61 (*)    Alkaline Phosphatase 175 (*)    GFR, Estimated 19 (*)    Anion gap 18 (*)    All other components within normal limits  URINALYSIS, ROUTINE W REFLEX MICROSCOPIC - Abnormal; Notable for the following components:   APPearance HAZY (*)    Hgb urine dipstick MODERATE (*)    Leukocytes,Ua MODERATE (*)    Bacteria, UA RARE (*)    All other components within normal limits  CBC WITH DIFFERENTIAL/PLATELET - Abnormal; Notable for the following components:   WBC 2.7 (*)    HCT 34.7 (*)    MCV 79.6 (*)    All other components within normal limits  MAGNESIUM - Abnormal; Notable for the following components:   Magnesium 0.8 (*)    All other components within normal limits  TROPONIN I (HIGH SENSITIVITY) - Abnormal; Notable for the following components:   Troponin I (High Sensitivity) 27 (*)    All other components within normal limits  RESP PANEL BY RT-PCR (FLU A&B, COVID) ARPGX2  C DIFFICILE QUICK SCREEN W PCR REFLEX    URINE CULTURE  CULTURE, BLOOD (ROUTINE X 2)  CULTURE, BLOOD (ROUTINE X 2)  LIPASE, BLOOD  LACTIC ACID, PLASMA  TROPONIN I (HIGH SENSITIVITY)    EKG EKG Interpretation  Date/Time:  Friday July 23 2020 00:04:16 EDT Ventricular Rate:  70 PR Interval:  169 QRS Duration: 108 QT  Interval:  541 QTC Calculation: 584 R Axis:   65 Text Interpretation: Sinus rhythm Vent pre-excit'n(WPW), right access'y pathway anterior ST depression, T wave inversions Confirmed by Ezequiel Essex [phone]) on 07/23/2020 12:19:47 AM  Radiology CT ABDOMEN PELVIS  WO CONTRAST  Result Date: 07/23/2020 CLINICAL DATA:  Left lower quadrant abdominal pain for 4 days. EXAM: CT ABDOMEN AND PELVIS WITHOUT CONTRAST TECHNIQUE: Multidetector CT imaging of the abdomen and pelvis was performed following the standard protocol without IV contrast. COMPARISON:  05/11/2020 FINDINGS: Lower chest: Lung bases are clear. Hepatobiliary: Cholelithiasis with multiple small stones layering in the gallbladder. No inflammatory changes. No focal liver lesions. Bile ducts are not dilated. Pancreas: Unremarkable. No pancreatic ductal dilatation or surrounding inflammatory changes. Spleen: Normal in size without focal abnormality. Adrenals/Urinary Tract: Adrenal glands are unremarkable. Kidneys are normal, without renal calculi, focal lesion, or hydronephrosis. Bladder is unremarkable. Stomach/Bowel: Stomach, small bowel, and colon are mostly decompressed. No inflammatory changes are appreciated. Appendix is not identified. Vascular/Lymphatic: No aortic aneurysm. No significant lymphadenopathy. Reproductive: Uterus appears surgically absent. Cystic lesion likely arising from the left adnexum, measuring 7.9 cm, similar to previous study. Other: No free air or free fluid in the abdomen. Abdominal wall musculature appears intact. Musculoskeletal: Degenerative changes in the spine. No destructive bone lesions. IMPRESSION: 1. No acute process demonstrated in the abdomen or pelvis. No evidence of bowel obstruction or inflammation. No renal or ureteral stone or obstruction. 2. Cholelithiasis without evidence of cholecystitis. 3. 7.9 cm diameter cystic lesion in the left adnexum as seen previously. Because this lesion is not adequately  characterized, prompt [name] is recommended for further evaluation. Note: This recommendation does not apply to premenarchal patients and to those with increased risk (genetic, family history, elevated tumor markers or other high-risk factors) of ovarian cancer. Reference: JACR 2020 Feb; 17(2):248-254 Electronically Signed   By: Lucienne Capers M.D.   On: 07/23/2020 01:55   CT Head Wo Contrast  Result Date: 07/22/2020 CLINICAL DATA:  Lower abdominal pain, nausea, vomiting, diarrhea. Head trauma. EXAM: CT HEAD WITHOUT CONTRAST CT CERVICAL SPINE WITHOUT CONTRAST TECHNIQUE: Multidetector CT imaging of the head and cervical spine was performed following the standard protocol without intravenous contrast. Multiplanar CT image reconstructions of the cervical spine were also generated. COMPARISON:  CT head 05/14/2020. CT cervical spine 10/19/2018 FINDINGS: CT HEAD FINDINGS Brain: Patchy and confluent areas of decreased attenuation are noted throughout the deep and periventricular white matter of the cerebral hemispheres bilaterally, compatible with chronic microvascular ischemic disease. No evidence of large-territorial acute infarction. No parenchymal hemorrhage. No mass lesion. No extra-axial collection. No mass effect or midline shift. No hydrocephalus. Basilar cisterns are patent. Empty sella. Vascular: No hyperdense vessel. Skull: No acute fracture or focal lesion. Sinuses/Orbits: Paranasal sinuses and mastoid air cells are clear. The orbits are unremarkable. Other: None. CT CERVICAL SPINE FINDINGS Alignment: Slight reversal of the normal cervical lordosis centered at the C3 level likely due to degenerative changes and positioning. Skull base and vertebrae: Multilevel moderate degenerative changes of the spine. No acute fracture. No aggressive appearing focal osseous lesion or focal pathologic process. Soft tissues and spinal canal: No prevertebral fluid or swelling. No visible canal hematoma. Upper chest:  Unremarkable. Other: None. IMPRESSION: 1. No acute intracranial abnormality. 2. No acute displaced fracture or traumatic listhesis of the cervical spine. 3. Empty sella. Findings is often a normal anatomic variant but can be associated with idiopathic intracranial hypertension (pseudotumor cerebri). Electronically Signed   By: Iven Finn M.D.   On: 07/22/2020 23:36   CT Cervical Spine Wo Contrast  Result Date: 07/22/2020 CLINICAL DATA:  Lower abdominal pain, nausea, vomiting, diarrhea. Head trauma. EXAM: CT HEAD WITHOUT CONTRAST CT CERVICAL SPINE WITHOUT CONTRAST TECHNIQUE: Multidetector CT imaging of  the head and cervical spine was performed following the standard protocol without intravenous contrast. Multiplanar CT image reconstructions of the cervical spine were also generated. COMPARISON:  CT head 05/14/2020. CT cervical spine 10/19/2018 FINDINGS: CT HEAD FINDINGS Brain: Patchy and confluent areas of decreased attenuation are noted throughout the deep and periventricular white matter of the cerebral hemispheres bilaterally, compatible with chronic microvascular ischemic disease. No evidence of large-territorial acute infarction. No parenchymal hemorrhage. No mass lesion. No extra-axial collection. No mass effect or midline shift. No hydrocephalus. Basilar cisterns are patent. Empty sella. Vascular: No hyperdense vessel. Skull: No acute fracture or focal lesion. Sinuses/Orbits: Paranasal sinuses and mastoid air cells are clear. The orbits are unremarkable. Other: None. CT CERVICAL SPINE FINDINGS Alignment: Slight reversal of the normal cervical lordosis centered at the C3 level likely due to degenerative changes and positioning. Skull base and vertebrae: Multilevel moderate degenerative changes of the spine. No acute fracture. No aggressive appearing focal osseous lesion or focal pathologic process. Soft tissues and spinal canal: No prevertebral fluid or swelling. No visible canal hematoma. Upper chest:  Unremarkable. Other: None. IMPRESSION: 1. No acute intracranial abnormality. 2. No acute displaced fracture or traumatic listhesis of the cervical spine. 3. Empty sella. Findings is often a normal anatomic variant but can be associated with idiopathic intracranial hypertension (pseudotumor cerebri). Electronically Signed   By: Iven Finn M.D.   On: 07/22/2020 23:36    Procedures .Critical Care  Date/Time: 07/23/2020 2:03 AM Performed by: Joanne Gavel, PA-C Authorized by: Joanne Gavel, PA-C   Critical care provider statement:    Critical care time (minutes):  70   Critical care time was exclusive of:  Separately billable procedures and treating other patients and teaching time   Critical care was necessary to treat or prevent imminent or life-threatening deterioration of the following conditions:  Metabolic crisis and dehydration   Critical care was time spent personally by me on the following activities:  Ordering and performing treatments and interventions, ordering and review of laboratory studies, ordering and review of radiographic studies, re-evaluation of patient's condition, review of old charts, obtaining history from patient or surrogate, examination of patient, evaluation of patient's response to treatment, discussions with consultants and development of treatment plan with patient or surrogate   I assumed direction of critical care for this patient from another provider in my specialty: no     Care discussed with: admitting provider     Medications Ordered in ED Medications  0.9 %  sodium chloride infusion (has no administration in time range)  magnesium sulfate IVPB 2 g 50 mL (has no administration in time range)  calcium gluconate 2 g/ 100 mL sodium chloride IVPB (has no administration in time range)  potassium chloride 10 mEq in 100 mL IVPB (10 mEq Intravenous New Bag/Given 07/23/20 0035)  piperacillin-tazobactam (ZOSYN) IVPB 3.375 g (has no administration in time range)   sodium chloride 0.9 % bolus 500 mL (500 mLs Intravenous New Bag/Given 07/23/20 0032)    ED Course  I have reviewed the triage vital signs and the nursing notes.  Pertinent labs & imaging results that were available during my care of the patient were reviewed by me and considered in my medical decision making (see chart for details).  Clinical Course as of 07/23/20 0204  Fri Jul 23, 2020  0052 Spoke with Dr. Shearon Stalls, critical care.  She has reviewed the patient's labs and work-up.  Feels the patient is appropriate for hospitalist admission since most concerning  feature is hyponatremia of less than 120.  However, patient does not seem appropriate for hypertonic saline at this time. [MM]    Clinical Course User Index [MM] Tekelia Kareem, Laymond Purser, PA-C   MDM Rules/Calculators/A&P                          60 year old female with a history of diabetes mellitus type 2, HTN, schizoaffective disorder, asthma, COPD, chronic respiratory failure on home O2, chronic diastolic CHF, PEA cardiac arrest in 2018, CKD, chronic pain syndrome, PTSD, binge eating disorder who presents the emergency department with a 1 week history of vomiting in the setting of a 2 to 3-week history of diarrhea.  She has not been able to eat or drink since her symptoms began.  [name] unable to quantify her urine output.  She states that she is fallen multiple times and hit her head, but has not had a loss of consciousness.  Last fall was earlier today.  [name] also endorsing left lower quadrant abdominal pain and dysuria.  Vital signs are stable.  The patient was seen and independently evaluated by Dr. Wyvonnia Dusky, attending physician.  Labs and imaging of been reviewed and independently interpreted by me.  She has a significant metabolic acidosis with metabolic derangements including hyponatremia to 113, hypokalemia to 2.2, and hypomagnesemia of 0.8.  She was given 2 g of calcium gluconate, 2 g of magnesium sulfate, and 3 runs of IV potassium  chloride as well as normal saline.  Since she is having intra-abdominal pain, will treat prophylactically with Zosyn in the setting of prolonged diarrhea.  UA is also concerning for UTI.  Zosyn previously covered on previous cultures and sensitivities.  We will add on blood cultures x2.  However, patient does not meet sepsis criteria at this time.  Discussed the patient with critical care.  Since she is mentating she does not need to be admitted to the ICU for hypertonic saline.  Recommended admission to the hospitalist.  CT head with no acute findings.  She has an empty sella. CT A/P: 7.9 cm diameter cystic lesion in the left adnexum as seen  previously. Because this lesion is not adequately characterized,  prompt [name] is recommended for further evaluation. Note: This  recommendation does not apply to premenarchal patients and to those  with increased risk (genetic, family history, elevated tumor markers  or other high-risk factors) of ovarian cancer.  Discussed the patient with Dr. Marlowe Sax, hospitalist.  She will accept the patient for admission as she is critically ill and will require further work-up and evaluation in the inpatient setting.  The patient appears reasonably stabilized for admission considering the current resources, flow, and capabilities available in the ED at this time, and I doubt any other [facility] requiring further screening and/or treatment in the ED prior to admission.   Final Clinical Impression(s) / ED Diagnoses Final diagnoses:  Metabolic acidosis  Hyponatremia  Hypomagnesemia  Hypokalemia  Acute cystitis without hematuria    Rx / DC Orders ED Discharge Orders     None        Joanne Gavel, PA-C 07/23/20 [id number]    Ezequiel Essex, MD 07/23/20 [phone]

## 2020-07-22 NOTE — ED Notes (Signed)
Pt to CT via stretcher

## 2020-07-22 NOTE — ED Triage Notes (Signed)
Pt arrives EMS from home with c/o lower abdominal pain, nausea, vomiting and diarrhea x 4 days.

## 2020-07-23 ENCOUNTER — Emergency Department (HOSPITAL_COMMUNITY): Payer: 59

## 2020-07-23 ENCOUNTER — Inpatient Hospital Stay (HOSPITAL_COMMUNITY): Payer: 59

## 2020-07-23 DIAGNOSIS — G9341 Metabolic encephalopathy: Secondary | ICD-10-CM | POA: Diagnosis present

## 2020-07-23 DIAGNOSIS — J439 Emphysema, unspecified: Secondary | ICD-10-CM | POA: Diagnosis present

## 2020-07-23 DIAGNOSIS — E1140 Type 2 diabetes mellitus with diabetic neuropathy, unspecified: Secondary | ICD-10-CM | POA: Diagnosis present

## 2020-07-23 DIAGNOSIS — D631 Anemia in chronic kidney disease: Secondary | ICD-10-CM | POA: Diagnosis present

## 2020-07-23 DIAGNOSIS — L89322 Pressure ulcer of left buttock, stage 2: Secondary | ICD-10-CM | POA: Diagnosis present

## 2020-07-23 DIAGNOSIS — I130 Hypertensive heart and chronic kidney disease with heart failure and stage 1 through stage 4 chronic kidney disease, or unspecified chronic kidney disease: Secondary | ICD-10-CM | POA: Diagnosis present

## 2020-07-23 DIAGNOSIS — Z681 Body mass index (BMI) 19 or less, adult: Secondary | ICD-10-CM | POA: Diagnosis not present

## 2020-07-23 DIAGNOSIS — F1421 Cocaine dependence, in remission: Secondary | ICD-10-CM | POA: Diagnosis present

## 2020-07-23 DIAGNOSIS — I5032 Chronic diastolic (congestive) heart failure: Secondary | ICD-10-CM | POA: Diagnosis present

## 2020-07-23 DIAGNOSIS — R296 Repeated falls: Secondary | ICD-10-CM | POA: Diagnosis present

## 2020-07-23 DIAGNOSIS — E872 Acidosis: Secondary | ICD-10-CM | POA: Diagnosis present

## 2020-07-23 DIAGNOSIS — R64 Cachexia: Secondary | ICD-10-CM | POA: Diagnosis present

## 2020-07-23 DIAGNOSIS — F250 Schizoaffective disorder, bipolar type: Secondary | ICD-10-CM | POA: Diagnosis present

## 2020-07-23 DIAGNOSIS — E871 Hypo-osmolality and hyponatremia: Secondary | ICD-10-CM | POA: Diagnosis present

## 2020-07-23 DIAGNOSIS — W19XXXA Unspecified fall, initial encounter: Secondary | ICD-10-CM | POA: Diagnosis present

## 2020-07-23 DIAGNOSIS — E1122 Type 2 diabetes mellitus with diabetic chronic kidney disease: Secondary | ICD-10-CM | POA: Diagnosis present

## 2020-07-23 DIAGNOSIS — E876 Hypokalemia: Secondary | ICD-10-CM | POA: Diagnosis present

## 2020-07-23 DIAGNOSIS — Z20822 Contact with and (suspected) exposure to covid-19: Secondary | ICD-10-CM | POA: Diagnosis present

## 2020-07-23 DIAGNOSIS — J9611 Chronic respiratory failure with hypoxia: Secondary | ICD-10-CM | POA: Diagnosis present

## 2020-07-23 DIAGNOSIS — N3000 Acute cystitis without hematuria: Secondary | ICD-10-CM | POA: Diagnosis present

## 2020-07-23 DIAGNOSIS — N179 Acute kidney failure, unspecified: Secondary | ICD-10-CM | POA: Diagnosis present

## 2020-07-23 DIAGNOSIS — L89152 Pressure ulcer of sacral region, stage 2: Secondary | ICD-10-CM | POA: Diagnosis present

## 2020-07-23 DIAGNOSIS — N1832 Chronic kidney disease, stage 3b: Secondary | ICD-10-CM | POA: Diagnosis present

## 2020-07-23 LAB — URINALYSIS, ROUTINE W REFLEX MICROSCOPIC
Bilirubin Urine: NEGATIVE
Glucose, UA: NEGATIVE mg/dL
Ketones, ur: NEGATIVE mg/dL
Nitrite: NEGATIVE
Protein, ur: NEGATIVE mg/dL
Specific Gravity, Urine: 1.006 (ref 1.005–1.030)
pH: 5 (ref 5.0–8.0)

## 2020-07-23 LAB — BASIC METABOLIC PANEL
Anion gap: 17 — ABNORMAL HIGH (ref 5–15)
Anion gap: 17 — ABNORMAL HIGH (ref 5–15)
Anion gap: 12 (ref 5–15)
Anion gap: 16 — ABNORMAL HIGH (ref 5–15)
BUN: 26 mg/dL — ABNORMAL HIGH (ref 8–23)
BUN: 25 mg/dL — ABNORMAL HIGH (ref 8–23)
BUN: 23 mg/dL (ref 8–23)
BUN: 22 mg/dL (ref 8–23)
CO2: 26 mmol/L (ref 22–32)
CO2: 27 mmol/L (ref 22–32)
CO2: 25 mmol/L (ref 22–32)
CO2: 21 mmol/L — ABNORMAL LOW (ref 22–32)
Calcium: 8 mg/dL — ABNORMAL LOW (ref 8.9–10.3)
Calcium: 7.9 mg/dL — ABNORMAL LOW (ref 8.9–10.3)
Calcium: 8 mg/dL — ABNORMAL LOW (ref 8.9–10.3)
Calcium: 8.3 mg/dL — ABNORMAL LOW (ref 8.9–10.3)
Chloride: 72 mmol/L — ABNORMAL LOW (ref 98–111)
Chloride: 74 mmol/L — ABNORMAL LOW (ref 98–111)
Chloride: 83 mmol/L — ABNORMAL LOW (ref 98–111)
Chloride: 84 mmol/L — ABNORMAL LOW (ref 98–111)
Creatinine, Ser: 2.51 mg/dL — ABNORMAL HIGH (ref 0.44–1.00)
Creatinine, Ser: 2.34 mg/dL — ABNORMAL HIGH (ref 0.44–1.00)
Creatinine, Ser: 2.07 mg/dL — ABNORMAL HIGH (ref 0.44–1.00)
Creatinine, Ser: 2.08 mg/dL — ABNORMAL HIGH (ref 0.44–1.00)
GFR, Estimated: 21 mL/min — ABNORMAL LOW (ref >60)
GFR, Estimated: 23 mL/min — ABNORMAL LOW (ref >60)
GFR, Estimated: 27 mL/min — ABNORMAL LOW (ref >60)
GFR, Estimated: 27 mL/min — ABNORMAL LOW (ref >60)
Glucose, Bld: 83 mg/dL (ref 70–99)
Glucose, Bld: 76 mg/dL (ref 70–99)
Glucose, Bld: 83 mg/dL (ref 70–99)
Glucose, Bld: 81 mg/dL (ref 70–99)
Potassium: <2 mmol/L — CL (ref 3.5–5.1)
Potassium: <2 mmol/L — CL (ref 3.5–5.1)
Potassium: <2 mmol/L — CL (ref 3.5–5.1)
Potassium: 2.7 mmol/L — CL (ref 3.5–5.1)
Sodium: 115 mmol/L — CL (ref 135–145)
Sodium: 118 mmol/L — CL (ref 135–145)
Sodium: 120 mmol/L — ABNORMAL LOW (ref 135–145)
Sodium: 121 mmol/L — ABNORMAL LOW (ref 135–145)

## 2020-07-23 LAB — CBC
HCT: 30.8 % — ABNORMAL LOW (ref 36.0–46.0)
Hemoglobin: 11 g/dL — ABNORMAL LOW (ref 12.0–15.0)
MCH: 28.8 pg (ref 26.0–34.0)
MCHC: 35.7 g/dL (ref 30.0–36.0)
MCV: 80.6 fL (ref 80.0–100.0)
Platelets: 236 10*3/uL (ref 150–400)
RBC: 3.82 MIL/uL — ABNORMAL LOW (ref 3.87–5.11)
RDW: 13.2 % (ref 11.5–15.5)
WBC: 2.2 10*3/uL — ABNORMAL LOW (ref 4.0–10.5)
nRBC: 0 % (ref 0.0–0.2)

## 2020-07-23 LAB — TROPONIN I (HIGH SENSITIVITY)
Troponin I (High Sensitivity): 27 ng/L — ABNORMAL HIGH (ref <18)
Troponin I (High Sensitivity): 34 ng/L — ABNORMAL HIGH (ref <18)
Troponin I (High Sensitivity): 29 ng/L — ABNORMAL HIGH (ref <18)

## 2020-07-23 LAB — HEMOGLOBIN A1C
Hgb A1c MFr Bld: 5.3 % (ref 4.8–5.6)
Mean Plasma Glucose: 105.41 mg/dL

## 2020-07-23 LAB — CBG MONITORING, ED
Glucose-Capillary: 86 mg/dL (ref 70–99)
Glucose-Capillary: 76 mg/dL (ref 70–99)
Glucose-Capillary: 70 mg/dL (ref 70–99)
Glucose-Capillary: 83 mg/dL (ref 70–99)
Glucose-Capillary: 71 mg/dL (ref 70–99)
Glucose-Capillary: 76 mg/dL (ref 70–99)

## 2020-07-23 LAB — GLUCOSE, CAPILLARY
Glucose-Capillary: 85 mg/dL (ref 70–99)
Glucose-Capillary: 102 mg/dL — ABNORMAL HIGH (ref 70–99)

## 2020-07-23 LAB — PHOSPHORUS: Phosphorus: 2.8 mg/dL (ref 2.5–4.6)

## 2020-07-23 LAB — RESP PANEL BY RT-PCR (FLU A&B, COVID) ARPGX2
Influenza A by PCR: NEGATIVE
Influenza B by PCR: NEGATIVE
SARS Coronavirus 2 by RT PCR: NEGATIVE

## 2020-07-23 LAB — CORTISOL: Cortisol, Plasma: 31.8 ug/dL

## 2020-07-23 LAB — HEPATIC FUNCTION PANEL
ALT: 14 U/L (ref 0–44)
AST: 48 U/L — ABNORMAL HIGH (ref 15–41)
Albumin: 3.7 g/dL (ref 3.5–5.0)
Alkaline Phosphatase: 153 U/L — ABNORMAL HIGH (ref 38–126)
Bilirubin, Direct: 0.2 mg/dL (ref 0.0–0.2)
Indirect Bilirubin: 0.5 mg/dL (ref 0.3–0.9)
Total Bilirubin: 0.7 mg/dL (ref 0.3–1.2)
Total Protein: 6.7 g/dL (ref 6.5–8.1)

## 2020-07-23 LAB — MAGNESIUM
Magnesium: 0.8 mg/dL — CL (ref 1.7–2.4)
Magnesium: 2.3 mg/dL (ref 1.7–2.4)

## 2020-07-23 LAB — AMMONIA: Ammonia: 15 umol/L (ref 9–35)

## 2020-07-23 LAB — VITAMIN B12: Vitamin B-12: 654 pg/mL (ref 180–914)

## 2020-07-23 LAB — TSH: TSH: 0.765 u[IU]/mL (ref 0.350–4.500)

## 2020-07-23 LAB — OSMOLALITY: Osmolality: 239 mOsm/kg — CL (ref 275–295)

## 2020-07-23 LAB — LACTIC ACID, PLASMA: Lactic Acid, Venous: 1.2 mmol/L (ref 0.5–1.9)

## 2020-07-23 MED ORDER — PIPERACILLIN-TAZOBACTAM 3.375 G IVPB 30 MIN
3.3750 g | Freq: Once | INTRAVENOUS | Status: AC
  Administered 2020-07-23: 3.375 g via INTRAVENOUS
  Filled 2020-07-23: qty 50

## 2020-07-23 MED ORDER — POTASSIUM CHLORIDE CRYS ER 20 MEQ PO TBCR
40.0000 meq | EXTENDED_RELEASE_TABLET | Freq: Once | ORAL | Status: DC
  Filled 2020-07-23: qty 2

## 2020-07-23 MED ORDER — CALCIUM GLUCONATE-NACL 1-0.675 GM/50ML-% IV SOLN
1.0000 g | Freq: Once | INTRAVENOUS | Status: AC
  Administered 2020-07-23: 1000 mg via INTRAVENOUS
  Filled 2020-07-23: qty 50

## 2020-07-23 MED ORDER — SODIUM CHLORIDE 0.9 % IV SOLN
1.0000 g | INTRAVENOUS | Status: DC
  Administered 2020-07-23 – 2020-07-25 (×3): 1 g via INTRAVENOUS
  Filled 2020-07-23 (×3): qty 10

## 2020-07-23 MED ORDER — INSULIN ASPART 100 UNIT/ML IJ SOLN
0.0000 [IU] | Freq: Three times a day (TID) | INTRAMUSCULAR | Status: DC
  Administered 2020-07-25: 1 [IU] via SUBCUTANEOUS
  Filled 2020-07-23: qty 0.09

## 2020-07-23 MED ORDER — METOPROLOL TARTRATE 5 MG/5ML IV SOLN
5.0000 mg | INTRAVENOUS | Status: DC | PRN
  Administered 2020-07-27: 5 mg via INTRAVENOUS
  Filled 2020-07-23: qty 5

## 2020-07-23 MED ORDER — MAGNESIUM SULFATE 2 GM/50ML IV SOLN
2.0000 g | Freq: Once | INTRAVENOUS | Status: AC
  Administered 2020-07-23: 2 g via INTRAVENOUS
  Filled 2020-07-23: qty 50

## 2020-07-23 MED ORDER — DEXAMETHASONE SODIUM PHOSPHATE 4 MG/ML IJ SOLN
4.0000 mg | Freq: Two times a day (BID) | INTRAMUSCULAR | Status: DC
  Administered 2020-07-23 – 2020-07-28 (×10): 4 mg via INTRAVENOUS
  Filled 2020-07-23 (×10): qty 1

## 2020-07-23 MED ORDER — POTASSIUM CHLORIDE CRYS ER 20 MEQ PO TBCR
40.0000 meq | EXTENDED_RELEASE_TABLET | Freq: Once | ORAL | Status: AC
  Administered 2020-07-23: 40 meq via ORAL
  Filled 2020-07-23: qty 2

## 2020-07-23 MED ORDER — IPRATROPIUM-ALBUTEROL 0.5-2.5 (3) MG/3ML IN SOLN: 3.0000 mL | RESPIRATORY_TRACT | Status: DC | PRN

## 2020-07-23 MED ORDER — POTASSIUM CHLORIDE 10 MEQ/100ML IV SOLN
10.0000 meq | INTRAVENOUS | Status: AC
  Administered 2020-07-23 (×6): 10 meq via INTRAVENOUS
  Filled 2020-07-23 (×5): qty 100

## 2020-07-23 MED ORDER — HYDRALAZINE HCL 20 MG/ML IJ SOLN
10.0000 mg | INTRAMUSCULAR | Status: DC | PRN
  Administered 2020-07-26: 10 mg via INTRAVENOUS
  Filled 2020-07-23: qty 1

## 2020-07-23 MED ORDER — POTASSIUM CHLORIDE 10 MEQ/100ML IV SOLN
10.0000 meq | INTRAVENOUS | Status: AC
  Administered 2020-07-23 (×6): 10 meq via INTRAVENOUS
  Filled 2020-07-23 (×6): qty 100

## 2020-07-23 MED ORDER — POTASSIUM CHLORIDE 10 MEQ/100ML IV SOLN
10.0000 meq | INTRAVENOUS | Status: AC
  Administered 2020-07-23 (×3): 10 meq via INTRAVENOUS
  Filled 2020-07-23 (×4): qty 100

## 2020-07-23 MED ORDER — DEXTROSE-NACL 5-0.9 % IV SOLN: INTRAVENOUS | Status: DC

## 2020-07-23 MED ORDER — CALCIUM GLUCONATE-NACL 2-0.675 GM/100ML-% IV SOLN
2.0000 g | Freq: Once | INTRAVENOUS | Status: AC
  Administered 2020-07-23: 2000 mg via INTRAVENOUS
  Filled 2020-07-23: qty 100

## 2020-07-23 MED ORDER — SODIUM CHLORIDE 0.9 % IV SOLN: INTRAVENOUS | Status: DC

## 2020-07-23 MED ORDER — MECLIZINE HCL 25 MG PO TABS: 12.5000 mg | ORAL_TABLET | Freq: Once | ORAL | Status: DC

## 2020-07-23 MED ORDER — POTASSIUM CHLORIDE 10 MEQ/100ML IV SOLN
10.0000 meq | INTRAVENOUS | Status: AC
  Administered 2020-07-23 (×3): 10 meq via INTRAVENOUS
  Filled 2020-07-23 (×3): qty 100

## 2020-07-23 MED ORDER — HEPARIN SODIUM (PORCINE) 5000 UNIT/ML IJ SOLN
5000.0000 [IU] | Freq: Three times a day (TID) | INTRAMUSCULAR | Status: DC
  Administered 2020-07-23 – 2020-07-28 (×16): 5000 [IU] via SUBCUTANEOUS
  Filled 2020-07-23 (×16): qty 1

## 2020-07-23 MED ORDER — SODIUM PHOSPHATES 45 MMOLE/15ML IV SOLN
15.0000 mmol | Freq: Once | INTRAVENOUS | Status: AC
  Administered 2020-07-23: 15 mmol via INTRAVENOUS
  Filled 2020-07-23: qty 5

## 2020-07-23 MED ORDER — INSULIN ASPART 100 UNIT/ML IJ SOLN
0.0000 [IU] | Freq: Every day | INTRAMUSCULAR | Status: DC
  Filled 2020-07-23: qty 0.05

## 2020-07-23 MED ORDER — MAGNESIUM SULFATE 4 GM/100ML IV SOLN: 4.0000 g | Freq: Once | INTRAVENOUS | Status: DC

## 2020-07-23 NOTE — ED Notes (Signed)
Dr. Marlowe Sax paged regarding pt vomiting bright green. Dr. Marlowe Sax stated to page floor coverage regarding this issue. Gershon Cull, NP notified

## 2020-07-23 NOTE — Progress Notes (Signed)
OT Cancellation Note  Patient Details Name: Tiffany [name] MRN: [id number] DOB: 1960/12/29   Cancelled Treatment:    Reason Eval/Treat Not Completed: Patient not medically ready. Abnormal labs. Patient not hemodynamically stable. Will follow and evaluate when patient medically stable.  Tiffany [name] Tiffany [name] 07/23/2020, 7:59 AM

## 2020-07-23 NOTE — ED Notes (Signed)
Critical Lab value Serum Osmolality- 239  Page sent to Dr. Reesa Chew

## 2020-07-23 NOTE — ED Notes (Signed)
Called to floor, RN is ready to accept patient.

## 2020-07-23 NOTE — Progress Notes (Signed)
A consult was received from an ED physician for Zosyn per pharmacy dosing.  The patient's profile has been reviewed for ht/wt/allergies/indication/available labs.   A one time order has been placed for Zosyn 3.375gm IV x1.  Further antibiotics/pharmacy consults should be ordered by admitting physician if indicated.                       Thank you, Netta Cedars PharmD 07/23/2020  12:59 AM

## 2020-07-23 NOTE — ED Notes (Signed)
Dr. Reesa Chew notified of potassium <2. Awaiting orders from Dr. Reesa Chew.

## 2020-07-23 NOTE — ED Notes (Signed)
Second IV attempt unsuccessful. Another RN will attempt IV

## 2020-07-23 NOTE — Progress Notes (Signed)
On arrival to the floor patient alert, responds some of my question, she lives at home with her mom and caregiver that comes 3X/week, then later stated she does not feel like answering anymore questions. Healed sacral ulcer and left buttock open skin ?? MASD, later lateral open skin, no drainage noted, cleansed and foam dressing applied.

## 2020-07-23 NOTE — ED Notes (Signed)
Went in to give pt medications. Pt was slow to respond. Pt responded to sternal rub. Pt would state her name then close her eyes and need to be awaken by sternal rub again. CBG obtained and sugar was 70. Pt was 72 HR on monitor. Pt was able to state year and where she was but had to be woken up between each question. Unable to give PO K+ at this time due to mental status Writer of this note paged Dr. Reesa Chew to update on occurrence.
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
## 2020-07-23 NOTE — ED Notes (Signed)
Tiffany Cull, NP notified that pt is not stable to stand up to obtain orthostatic vitals. Pt is drowsy and weak

## 2020-07-23 NOTE — ED Notes (Signed)
Served as Producer, television/film/video for transvaginal US. Pt tolerated well.

## 2020-07-23 NOTE — ED Notes (Signed)
Paged Dr. Reesa Chew for critical labs. Awaiting return call

## 2020-07-23 NOTE — Progress Notes (Signed)
PT Cancellation Note  Patient Details Name: Tiffany [name] MRN: [id number] DOB: 1960-06-13   Cancelled Treatment:    Reason Eval/Treat Not Completed: Patient not medically ready, noted critical lab results. Will evaluate when more normalized labs.      [name] Pager [phone] Office [phone]  Claretha Cooper 07/23/2020, 7:55 AM

## 2020-07-23 NOTE — ED Notes (Signed)
Dr. Wyvonnia Dusky to start [name] iv

## 2020-07-24 LAB — BASIC METABOLIC PANEL
Anion gap: 13 (ref 5–15)
Anion gap: 10 (ref 5–15)
Anion gap: 9 (ref 5–15)
Anion gap: 8 (ref 5–15)
BUN: 19 mg/dL (ref 8–23)
BUN: 17 mg/dL (ref 8–23)
BUN: 14 mg/dL (ref 8–23)
BUN: 12 mg/dL (ref 8–23)
CO2: 22 mmol/L (ref 22–32)
CO2: 22 mmol/L (ref 22–32)
CO2: 20 mmol/L — ABNORMAL LOW (ref 22–32)
CO2: 21 mmol/L — ABNORMAL LOW (ref 22–32)
Calcium: 8.4 mg/dL — ABNORMAL LOW (ref 8.9–10.3)
Calcium: 8.5 mg/dL — ABNORMAL LOW (ref 8.9–10.3)
Calcium: 8.6 mg/dL — ABNORMAL LOW (ref 8.9–10.3)
Calcium: 8.8 mg/dL — ABNORMAL LOW (ref 8.9–10.3)
Chloride: 88 mmol/L — ABNORMAL LOW (ref 98–111)
Chloride: 92 mmol/L — ABNORMAL LOW (ref 98–111)
Chloride: 94 mmol/L — ABNORMAL LOW (ref 98–111)
Chloride: 95 mmol/L — ABNORMAL LOW (ref 98–111)
Creatinine, Ser: 1.69 mg/dL — ABNORMAL HIGH (ref 0.44–1.00)
Creatinine, Ser: 1.69 mg/dL — ABNORMAL HIGH (ref 0.44–1.00)
Creatinine, Ser: 1.5 mg/dL — ABNORMAL HIGH (ref 0.44–1.00)
Creatinine, Ser: 1.44 mg/dL — ABNORMAL HIGH (ref 0.44–1.00)
GFR, Estimated: 34 mL/min — ABNORMAL LOW (ref >60)
GFR, Estimated: 34 mL/min — ABNORMAL LOW (ref >60)
GFR, Estimated: 39 mL/min — ABNORMAL LOW (ref >60)
GFR, Estimated: 41 mL/min — ABNORMAL LOW (ref >60)
Glucose, Bld: 118 mg/dL — ABNORMAL HIGH (ref 70–99)
Glucose, Bld: 103 mg/dL — ABNORMAL HIGH (ref 70–99)
Glucose, Bld: 108 mg/dL — ABNORMAL HIGH (ref 70–99)
Glucose, Bld: 113 mg/dL — ABNORMAL HIGH (ref 70–99)
Potassium: 2.3 mmol/L — CL (ref 3.5–5.1)
Potassium: 3.7 mmol/L (ref 3.5–5.1)
Potassium: 3.7 mmol/L (ref 3.5–5.1)
Potassium: 4 mmol/L (ref 3.5–5.1)
Sodium: 123 mmol/L — ABNORMAL LOW (ref 135–145)
Sodium: 124 mmol/L — ABNORMAL LOW (ref 135–145)
Sodium: 123 mmol/L — ABNORMAL LOW (ref 135–145)
Sodium: 124 mmol/L — ABNORMAL LOW (ref 135–145)

## 2020-07-24 LAB — CBC
HCT: 29.4 % — ABNORMAL LOW (ref 36.0–46.0)
Hemoglobin: 10.2 g/dL — ABNORMAL LOW (ref 12.0–15.0)
MCH: 28.6 pg (ref 26.0–34.0)
MCHC: 34.7 g/dL (ref 30.0–36.0)
MCV: 82.4 fL (ref 80.0–100.0)
Platelets: 228 10*3/uL (ref 150–400)
RBC: 3.57 MIL/uL — ABNORMAL LOW (ref 3.87–5.11)
RDW: 13.4 % (ref 11.5–15.5)
WBC: 2.6 10*3/uL — ABNORMAL LOW (ref 4.0–10.5)
nRBC: 0 % (ref 0.0–0.2)

## 2020-07-24 LAB — GLUCOSE, CAPILLARY
Glucose-Capillary: 104 mg/dL — ABNORMAL HIGH (ref 70–99)
Glucose-Capillary: 106 mg/dL — ABNORMAL HIGH (ref 70–99)
Glucose-Capillary: 117 mg/dL — ABNORMAL HIGH (ref 70–99)
Glucose-Capillary: 97 mg/dL (ref 70–99)

## 2020-07-24 LAB — MAGNESIUM: Magnesium: 1.5 mg/dL — ABNORMAL LOW (ref 1.7–2.4)

## 2020-07-24 MED ORDER — POTASSIUM CHLORIDE 20 MEQ PO PACK
40.0000 meq | PACK | Freq: Once | ORAL | Status: AC
  Administered 2020-07-24: 40 meq via ORAL
  Filled 2020-07-24: qty 2

## 2020-07-24 MED ORDER — MAGNESIUM SULFATE 2 GM/50ML IV SOLN
2.0000 g | Freq: Once | INTRAVENOUS | Status: AC
  Administered 2020-07-24: 2 g via INTRAVENOUS
  Filled 2020-07-24: qty 50

## 2020-07-24 MED ORDER — DIAZEPAM 5 MG PO TABS
10.0000 mg | ORAL_TABLET | Freq: Three times a day (TID) | ORAL | Status: DC | PRN
  Administered 2020-07-27 – 2020-07-28 (×2): 10 mg via ORAL
  Filled 2020-07-24 (×2): qty 2

## 2020-07-24 MED ORDER — POTASSIUM CHLORIDE 10 MEQ/100ML IV SOLN
10.0000 meq | INTRAVENOUS | Status: AC
  Administered 2020-07-24 (×6): 10 meq via INTRAVENOUS
  Filled 2020-07-24 (×6): qty 100

## 2020-07-24 MED ORDER — MONTELUKAST SODIUM 10 MG PO TABS
10.0000 mg | ORAL_TABLET | Freq: Every day | ORAL | Status: DC
  Administered 2020-07-24 – 2020-07-27 (×4): 10 mg via ORAL
  Filled 2020-07-24 (×4): qty 1

## 2020-07-24 MED ORDER — LISDEXAMFETAMINE DIMESYLATE 20 MG PO CAPS: 20.0000 mg | ORAL_CAPSULE | Freq: Every day | ORAL | Status: DC | PRN

## 2020-07-24 MED ORDER — OXYBUTYNIN CHLORIDE 5 MG PO TABS
5.0000 mg | ORAL_TABLET | Freq: Two times a day (BID) | ORAL | Status: DC
  Administered 2020-07-24 – 2020-07-28 (×9): 5 mg via ORAL
  Filled 2020-07-24 (×9): qty 1

## 2020-07-24 MED ORDER — PRAZOSIN HCL 1 MG PO CAPS: 2.0000 mg | ORAL_CAPSULE | Freq: Every day | ORAL | Status: DC

## 2020-07-24 MED ORDER — PRAZOSIN HCL 5 MG PO CAPS
5.0000 mg | ORAL_CAPSULE | Freq: Two times a day (BID) | ORAL | Status: DC
  Administered 2020-07-24 – 2020-07-28 (×9): 5 mg via ORAL
  Filled 2020-07-24 (×9): qty 1

## 2020-07-24 MED ORDER — POTASSIUM CHLORIDE CRYS ER 20 MEQ PO TBCR
40.0000 meq | EXTENDED_RELEASE_TABLET | ORAL | Status: AC
  Administered 2020-07-24 (×2): 40 meq via ORAL
  Filled 2020-07-24 (×2): qty 2

## 2020-07-24 MED ORDER — GABAPENTIN 300 MG PO CAPS
300.0000 mg | ORAL_CAPSULE | Freq: Two times a day (BID) | ORAL | Status: DC
  Administered 2020-07-24 – 2020-07-28 (×9): 300 mg via ORAL
  Filled 2020-07-24 (×9): qty 1

## 2020-07-24 NOTE — Evaluation (Signed)
Physical Therapy Evaluation Patient Details Name: Tiffany [name] MRN: [id number] DOB: [birth year] Today's Date: 07/24/2020   History of Present Illness  60 y.o. female  presented to the ED with complaints of left lower quadrant abdominal pain, vomiting, diarrhea, and multiple falls  adm  with hyponatremia. PMH: significant of asthma, COPD, chronic respiratory failure on home oxygen, OSA, chronic diastolic CHF, hypertension, hyperlipidemia, type 2 diabetes, CVA, cardiac arrest in 2018, history of cocaine use, CKD stage III, anemia, retinopathy, gout, pelvic mass, chronic left heel ulcer, chronic pain syndrome, GERD, schizoaffective disorder bipolar type, PTSD, MDD, anxiety  Clinical Impression  Pt agreeable only to sitting EOB today/d/t fatigue. Sat EOB ~ 9 minutes and able to take meds while sitting up. Pt is globally weak and with multiple areas of skin breakdown. Would benefit from SNF if agreeable. Pt reports she amb with RW at baseline and has caregivers 7d/wk for about half the day  Pt will benefit from skilled PT to increase their independence and safety with mobility to allow discharge to the venue listed below.      Follow Up Recommendations SNF ([facility] if pt not agreeable to SNF)    Equipment Recommendations  None recommended by PT    Recommendations for Other Services       Precautions / Restrictions Precautions Precautions: Fall Restrictions Weight Bearing Restrictions: No      Mobility  Bed Mobility Overal bed mobility: Needs Assistance Bed Mobility: Rolling;Sidelying to Sit;Sit to Sidelying Rolling: Min assist Sidelying to sit: Min assist;Mod assist     Sit to sidelying: Min assist;Mod assist General bed mobility comments: cues to self assist, assist to bring LEs off bed and elevate trunk, assist with lifitng LEs on to bed to return to s/l    Transfers                 General transfer comment: pt declined  Ambulation/Gait                Stairs             Wheelchair Mobility    Modified Rankin (Stroke Patients Only)       Balance Overall balance assessment: Needs assistance Sitting-balance support: No upper extremity supported;Feet unsupported Sitting balance-Leahy Scale: Fair Sitting balance - Comments: assist to achieve midline, then pt able to maintain, able to take pills sitting EOB without LOB, close supervision for safety                                     Pertinent Vitals/Pain Pain Assessment: Faces Faces Pain Scale: Hurts a little bit Pain Location: feet Pain Descriptors / Indicators: Sore;Numbness Pain Intervention(s): Limited activity within patient's tolerance;Monitored during session    Home Living Family/patient expects to be discharged to:: Private residence Living Arrangements:  (per chart lives with her mother) Available Help at Discharge: Personal care attendant;Available PRN/intermittently Type of Home: House Home Access: Stairs to enter   [employer] of Steps: did not state amount Home Layout: One level Home Equipment: Cane - single point;Walker - 2 wheels;Walker - 4 wheels Additional Comments: has [facility] aide 7d/wk    Prior Function Level of Independence: Independent with assistive device(s);Needs assistance   Gait / Transfers Assistance Needed: ambulated with RW  ADL's / Homemaking Assistance Needed: pt reports I with ADLs/sponge bathes, has aide daily for 4.5 hrs to assist with IADLs cooking, cleaning  Comments: states  using a walker at baseline     Hand Dominance        Extremity/Trunk Assessment   Upper Extremity Assessment Upper Extremity Assessment: Generalized weakness    Lower Extremity Assessment Lower Extremity Assessment: Generalized weakness (unable to df bil ankles pt states it is d/t pain & numbness)       Communication   Communication: No difficulties  Cognition Arousal/Alertness: Awake/alert Behavior During Therapy: Flat  affect Overall Cognitive Status: No family/caregiver present to determine baseline cognitive functioning Area of Impairment: Following commands                       Following Commands: Follows one step commands with increased time              General Comments      Exercises     Assessment/Plan    PT Assessment Patient needs continued PT services  PT Problem List Decreased strength;Decreased mobility;Decreased activity tolerance;Decreased balance       PT Treatment Interventions DME instruction;Therapeutic exercise;Functional mobility training;Therapeutic activities;Patient/family education;Gait training;Balance training    PT Goals (Current goals can be found in the Care Plan section)  Acute Rehab PT Goals Patient Stated Goal: home PT Goal Formulation: With patient Time For Goal Achievement: 08/07/20 Potential to Achieve Goals: Good    Frequency Min 3X/week   Barriers to discharge        Co-evaluation               AM-PAC PT "6 Clicks" Mobility  Outcome Measure Help needed turning from your back to your side while in a flat bed without using bedrails?: A Little Help needed moving from lying on your back to sitting on the side of a flat bed without using bedrails?: A Lot Help needed moving to and from a bed to a chair (including a wheelchair)?: A Lot Help needed standing up from a chair using your arms (e.g., wheelchair or bedside chair)?: A Lot Help needed to walk in hospital room?: A Lot Help needed climbing 3-5 steps with a railing? : Total 6 Click Score: 12    End of Session   Activity Tolerance: Patient limited by fatigue Patient left: in bed;with call bell/phone within reach;with bed alarm set;with nursing/sitter in room Nurse Communication: Mobility status PT Visit Diagnosis: Other abnormalities of gait and mobility (R26.89);Muscle weakness (generalized) (M62.81)    Time: 1212-1226 PT Time Calculation (min) (ACUTE ONLY): 14  min   Charges:   PT Evaluation $PT Eval Low Complexity: [place], PT  Acute Rehab Dept ([place]) [phone] Pager [phone]  07/24/2020   [facility] 07/24/2020, 12:58 PM

## 2020-07-24 NOTE — Progress Notes (Signed)
OT Cancellation Note  Patient Details Name: IMYA MANCE MRN: [id number] DOB: October 14, 1960   Cancelled Treatment:    Reason Eval/Treat Not Completed: Patient declined, no reason specified;Patient's level of consciousness: Pt in bed with eyes closed. Needed name call  x 3 to respond. Pt did not open eyes but stated, "I don't feel like that right now." after OT identified self.  Will continue efforts.   Julien Girt 07/24/2020, 1:57 PM
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
## 2020-07-25 LAB — C DIFFICILE QUICK SCREEN W PCR REFLEX
C Diff antigen: NEGATIVE
C Diff interpretation: NOT DETECTED
C Diff toxin: NEGATIVE

## 2020-07-25 LAB — GLUCOSE, CAPILLARY
Glucose-Capillary: 134 mg/dL — ABNORMAL HIGH (ref 70–99)
Glucose-Capillary: 104 mg/dL — ABNORMAL HIGH (ref 70–99)
Glucose-Capillary: 127 mg/dL — ABNORMAL HIGH (ref 70–99)
Glucose-Capillary: 96 mg/dL (ref 70–99)

## 2020-07-25 LAB — BASIC METABOLIC PANEL
Anion gap: 7 (ref 5–15)
Anion gap: 7 (ref 5–15)
BUN: 11 mg/dL (ref 8–23)
BUN: 10 mg/dL (ref 8–23)
CO2: 21 mmol/L — ABNORMAL LOW (ref 22–32)
CO2: 20 mmol/L — ABNORMAL LOW (ref 22–32)
Calcium: 8.7 mg/dL — ABNORMAL LOW (ref 8.9–10.3)
Calcium: 9.1 mg/dL (ref 8.9–10.3)
Chloride: 97 mmol/L — ABNORMAL LOW (ref 98–111)
Chloride: 99 mmol/L (ref 98–111)
Creatinine, Ser: 1.35 mg/dL — ABNORMAL HIGH (ref 0.44–1.00)
Creatinine, Ser: 1.36 mg/dL — ABNORMAL HIGH (ref 0.44–1.00)
GFR, Estimated: 45 mL/min — ABNORMAL LOW (ref >60)
GFR, Estimated: 44 mL/min — ABNORMAL LOW (ref >60)
Glucose, Bld: 106 mg/dL — ABNORMAL HIGH (ref 70–99)
Glucose, Bld: 100 mg/dL — ABNORMAL HIGH (ref 70–99)
Potassium: 4.4 mmol/L (ref 3.5–5.1)
Potassium: 4.5 mmol/L (ref 3.5–5.1)
Sodium: 125 mmol/L — ABNORMAL LOW (ref 135–145)
Sodium: 126 mmol/L — ABNORMAL LOW (ref 135–145)

## 2020-07-25 LAB — CBC
HCT: 26.1 % — ABNORMAL LOW (ref 36.0–46.0)
Hemoglobin: 8.8 g/dL — ABNORMAL LOW (ref 12.0–15.0)
MCH: 28.7 pg (ref 26.0–34.0)
MCHC: 33.7 g/dL (ref 30.0–36.0)
MCV: 85 fL (ref 80.0–100.0)
Platelets: 216 10*3/uL (ref 150–400)
RBC: 3.07 MIL/uL — ABNORMAL LOW (ref 3.87–5.11)
RDW: 14.3 % (ref 11.5–15.5)
WBC: 3.4 10*3/uL — ABNORMAL LOW (ref 4.0–10.5)
nRBC: 0 % (ref 0.0–0.2)

## 2020-07-25 LAB — OSMOLALITY, URINE: Osmolality, Ur: 290 mOsm/kg — ABNORMAL LOW (ref 300–900)

## 2020-07-25 LAB — URINE CULTURE: Culture: >=100000 — AB

## 2020-07-25 LAB — SODIUM: Sodium: 124 mmol/L — ABNORMAL LOW (ref 135–145)

## 2020-07-25 LAB — SODIUM, URINE, RANDOM: Sodium, Ur: 38 mmol/L

## 2020-07-25 LAB — MAGNESIUM: Magnesium: 1.6 mg/dL — ABNORMAL LOW (ref 1.7–2.4)

## 2020-07-25 MED ORDER — ACETAMINOPHEN 325 MG PO TABS
650.0000 mg | ORAL_TABLET | Freq: Four times a day (QID) | ORAL | Status: DC | PRN
  Administered 2020-07-27 (×2): 650 mg via ORAL
  Filled 2020-07-25 (×3): qty 2

## 2020-07-25 MED ORDER — KETOROLAC TROMETHAMINE 15 MG/ML IJ SOLN
15.0000 mg | Freq: Once | INTRAMUSCULAR | Status: AC
  Administered 2020-07-25: 15 mg via INTRAVENOUS
  Filled 2020-07-25: qty 1

## 2020-07-25 MED ORDER — MAGNESIUM SULFATE 2 GM/50ML IV SOLN
2.0000 g | Freq: Once | INTRAVENOUS | Status: AC
  Administered 2020-07-25: 2 g via INTRAVENOUS
  Filled 2020-07-25: qty 50

## 2020-07-25 MED ORDER — SODIUM CHLORIDE 0.9 % IV SOLN: INTRAVENOUS | Status: DC

## 2020-07-25 NOTE — Progress Notes (Signed)
Patient complains of generalized pain. Nurse paged night time provider for either tylenol or Toradol for pain management. Provider ordered tylenol. Patient informed that I was giving tylenol for pain, patient refused. Toradol was given this morning, patient educated that Toradol and tylenol works in similar ways. Patient still refused, medication returned to bin.

## 2020-07-25 NOTE — Evaluation (Signed)
Occupational Therapy Evaluation Patient Details Name: Tiffany [name] MRN: [id number] DOB: October 17, 1960 Today's Date: 07/25/2020    History of Present Illness 60 y.o. female  presented to the ED with complaints of left lower quadrant abdominal pain, vomiting, diarrhea, and multiple falls  adm  with hyponatremia. PMH: significant of asthma, COPD, chronic respiratory failure on home oxygen, OSA, chronic diastolic CHF, hypertension, hyperlipidemia, type 2 diabetes, CVA, cardiac arrest in 2018, history of cocaine use, CKD stage III, anemia, retinopathy, gout, pelvic mass, chronic left heel ulcer, chronic pain syndrome, GERD, schizoaffective disorder bipolar type, PTSD, MDD, anxiety   Clinical Impression   Pt unable to provide prior level of information or home setup due to level of arousal. Information was gathered per chart. PTA, pt was living at home with her mom and has a pca 7days/week for 4.5hours to assist pt with IADL. Pt was otherwise independent with ADL and modified independent with functional mobility with use of RW. Pt currently significantly limited secondary to level of arousal. Pt kept her eye shut majority of today's session. During simple mobility tasks, she followed one step commands ~50% of the time with increased time and multimodal cues. Due to decline in current level of function, pt would benefit from acute OT to address established goals to facilitate safe D/C to venue listed below. At this time, recommend SNF follow-up. Will continue to follow acutely.     Follow Up Recommendations  SNF;Supervision/Assistance - 24 hour    Equipment Recommendations  Other (comment) (tbd)    Recommendations for Other Services       Precautions / Restrictions Precautions Precautions: Fall Restrictions Weight Bearing Restrictions: No      Mobility Bed Mobility Overal bed mobility: Needs Assistance Bed Mobility: Rolling;Sidelying to Sit;Sit to Sidelying Rolling: Min assist Sidelying to  sit: Mod assist;HOB elevated     Sit to sidelying: Mod assist;HOB elevated General bed mobility comments: max cues for sequencing, modA to progress trunk to upright posture    Transfers                 General transfer comment: pt declined and not appropriate due to level of arousal    Balance Overall balance assessment: Needs assistance Sitting-balance support: No upper extremity supported;Feet unsupported Sitting balance-Leahy Scale: Fair Sitting balance - Comments: minA for stability sitting EOB, pt unable to keep her eyes open long enough to progress mobiltiy                                   ADL either performed or assessed with clinical judgement   ADL Overall ADL's : Needs assistance/impaired                                       General ADL Comments: pt currently totalA for all ADL secondary to level of arousal. Pt keeping eyes shut majority of the session, would follow one sep commands with increased time and with multimodal cues     Vision Baseline Vision/History: Wears glasses Wears Glasses: At all times Additional Comments: will continue to assess     Perception     Praxis      Pertinent Vitals/Pain Pain Assessment: Faces Faces Pain Scale: Hurts a little bit Pain Location: feet Pain Descriptors / Indicators: Grimacing;Guarding Pain Intervention(s): Limited activity within patient's tolerance;Monitored during session  Hand Dominance Right   Extremity/Trunk Assessment Upper Extremity Assessment Upper Extremity Assessment: Difficult to assess due to impaired cognition   Lower Extremity Assessment Lower Extremity Assessment: Difficult to assess due to impaired cognition   Cervical / Trunk Assessment Cervical / Trunk Assessment: Kyphotic   Communication     Cognition Arousal/Alertness: Lethargic Behavior During Therapy: Flat affect Overall Cognitive Status: Difficult to assess                          Following Commands: Follows one step commands with increased time       General Comments: pt keeping eyes shut majority of session, she did follow one step commands wih multimodal cues and increased time ~50% of the time, will continue to assess   General Comments  orhostatics taken for supine and siting EOB, please reference vitals section for this information    Exercises     Shoulder Instructions      Home Living Family/patient expects to be discharged to:: Private residence Living Arrangements:  (per chart lives with her mother) Available Help at Discharge: Personal care attendant;Available PRN/intermittently Type of Home: House Home Access: Stairs to enter [employer] of Steps: did not state amount   Home Layout: One level               Home Equipment: Cane - single point;Walker - 2 wheels;Walker - 4 wheels   Additional Comments: information above gathered from pt's chart, as pt was unable to provide this information      Prior Functioning/Environment Level of Independence: Independent with assistive device(s);Needs assistance  Gait / Transfers Assistance Needed: ambulated with RW ADL's / Homemaking Assistance Needed: pt reports I with ADLs/sponge bathes, has aide daily for 4.5 hrs to assist with IADLs cooking, cleaning   Comments: information gathered per chart        OT Problem List: Decreased activity tolerance;Impaired balance (sitting and/or standing);Decreased safety awareness;Decreased cognition;Decreased knowledge of use of DME or AE      OT Treatment/Interventions: Self-care/ADL training;Therapeutic exercise;Therapeutic activities;Cognitive remediation/compensation;Patient/family education;Balance training    OT Goals(Current goals can be found in the care plan section) Acute Rehab OT Goals Patient Stated Goal: pt requesting to sleep OT Goal Formulation: With patient Time For Goal Achievement: 08/08/20 Potential to Achieve Goals:  Good ADL Goals Pt Will Perform Grooming: standing;with min guard assist Pt Will Perform Lower Body Dressing: with min guard assist;sit to/from stand Pt Will Transfer to Toilet: with min guard assist;ambulating Additional ADL Goal #1: Pt will demonstrate anticipatory awareness for safe engagement of ADL.  OT Frequency: Min 2X/week   Barriers to D/C:            Co-evaluation              AM-PAC OT "6 Clicks" Daily Activity     Outcome Measure Help from another person eating meals?: Total Help from another person taking care of personal grooming?: Total Help from another person toileting, which includes using toliet, bedpan, or urinal?: Total Help from another person bathing (including washing, rinsing, drying)?: Total Help from another person to put on and taking off regular upper body clothing?: Total Help from another person to put on and taking off regular lower body clothing?: Total 6 Click Score: 6   End of Session Nurse Communication: Mobility status  Activity Tolerance: Patient limited by lethargy Patient left: in bed;with call bell/phone within reach;with bed alarm set;Other (comment) (with lights on and TV on;pt  requested to remain in sidelying position)  OT Visit Diagnosis: Other abnormalities of gait and mobility (R26.89);Muscle weakness (generalized) (M62.81);Other symptoms and signs involving cognitive function                Time: 3754-3606 OT Time Calculation (min): 18 min Charges:  OT General Charges $OT Visit: 1 Visit OT Evaluation $OT Eval Moderate Complexity: [place] OTR/L Acute Rehabilitation Services Office: [place] 07/25/2020, 11:14 AM
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
## 2020-07-26 ENCOUNTER — Ambulatory Visit (HOSPITAL_COMMUNITY): Payer: Medicaid Other | Admitting: Psychiatry

## 2020-07-26 ENCOUNTER — Encounter (HOSPITAL_COMMUNITY): Payer: Self-pay | Admitting: Psychiatry

## 2020-07-26 ENCOUNTER — Other Ambulatory Visit: Payer: Self-pay | Admitting: Family Medicine

## 2020-07-26 DIAGNOSIS — F250 Schizoaffective disorder, bipolar type: Secondary | ICD-10-CM

## 2020-07-26 LAB — GLUCOSE, CAPILLARY
Glucose-Capillary: 97 mg/dL (ref 70–99)
Glucose-Capillary: 97 mg/dL (ref 70–99)
Glucose-Capillary: 91 mg/dL (ref 70–99)
Glucose-Capillary: 98 mg/dL (ref 70–99)

## 2020-07-26 LAB — CBC
HCT: 25.8 % — ABNORMAL LOW (ref 36.0–46.0)
Hemoglobin: 8.5 g/dL — ABNORMAL LOW (ref 12.0–15.0)
MCH: 28.2 pg (ref 26.0–34.0)
MCHC: 32.9 g/dL (ref 30.0–36.0)
MCV: 85.7 fL (ref 80.0–100.0)
Platelets: 237 10*3/uL (ref 150–400)
RBC: 3.01 MIL/uL — ABNORMAL LOW (ref 3.87–5.11)
RDW: 14.6 % (ref 11.5–15.5)
WBC: 3.6 10*3/uL — ABNORMAL LOW (ref 4.0–10.5)
nRBC: 0 % (ref 0.0–0.2)

## 2020-07-26 LAB — GASTROINTESTINAL PANEL BY PCR, STOOL (REPLACES STOOL CULTURE)

## 2020-07-26 LAB — BASIC METABOLIC PANEL
Anion gap: 7 (ref 5–15)
Anion gap: 5 (ref 5–15)
BUN: 12 mg/dL (ref 8–23)
BUN: 12 mg/dL (ref 8–23)
CO2: 21 mmol/L — ABNORMAL LOW (ref 22–32)
CO2: 20 mmol/L — ABNORMAL LOW (ref 22–32)
Calcium: 9 mg/dL (ref 8.9–10.3)
Calcium: 9.2 mg/dL (ref 8.9–10.3)
Chloride: 100 mmol/L (ref 98–111)
Chloride: 102 mmol/L (ref 98–111)
Creatinine, Ser: 1.3 mg/dL — ABNORMAL HIGH (ref 0.44–1.00)
Creatinine, Ser: 1.29 mg/dL — ABNORMAL HIGH (ref 0.44–1.00)
GFR, Estimated: 47 mL/min — ABNORMAL LOW (ref >60)
GFR, Estimated: 47 mL/min — ABNORMAL LOW (ref >60)
Glucose, Bld: 103 mg/dL — ABNORMAL HIGH (ref 70–99)
Glucose, Bld: 108 mg/dL — ABNORMAL HIGH (ref 70–99)
Potassium: 4.6 mmol/L (ref 3.5–5.1)
Potassium: 4.7 mmol/L (ref 3.5–5.1)
Sodium: 128 mmol/L — ABNORMAL LOW (ref 135–145)
Sodium: 127 mmol/L — ABNORMAL LOW (ref 135–145)

## 2020-07-26 LAB — MAGNESIUM: Magnesium: 1.8 mg/dL (ref 1.7–2.4)

## 2020-07-26 MED ORDER — KETOROLAC TROMETHAMINE 15 MG/ML IJ SOLN
15.0000 mg | Freq: Once | INTRAMUSCULAR | Status: AC
  Administered 2020-07-26: 15 mg via INTRAVENOUS
  Filled 2020-07-26: qty 1
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
## 2020-07-26 NOTE — Plan of Care (Signed)

## 2020-07-26 NOTE — Telephone Encounter (Signed)
Requested medication (s) are due for refill today: yes  Requested medication (s) are on the active medication list: yes  Last refill:  05/25/20  Future visit scheduled: yes  Notes to clinic:  Please review for refill. Refill not delegated per protocol.     Requested Prescriptions  Pending Prescriptions Disp Refills   tiZANidine (ZANAFLEX) 4 MG tablet [Pharmacy Med Name: TIZANIDINE 4MG  TABLETS] 60 tablet     Sig: TAKE 1 TABLET BY MOUTH EVERY 8 HOURS AS NEEDED MUSCLE SPASMS      Not Delegated - Cardiovascular:  Alpha-2 Agonists - tizanidine Failed - 07/26/2020  4:36 PM      Failed - This refill cannot be delegated      Passed - Valid encounter within last 6 months    Recent Outpatient Visits           1 month ago Heel ulcer, left, with fat layer exposed ([place])   [place], Charlane Ferretti, MD   2 months ago Edema of both legs   [place] Ladell Pier, MD   3 months ago Pelvic mass in female   [place], Enobong, MD   7 months ago Overactive bladder   [place], MD   1 year ago Refused assessment of physical health   [facility], Delfino Lovett, NP       Future Appointments             In 2 weeks [name], MD [place]   In 1 month [name], Event organiser T, PA-C [place], [facility]

## 2020-07-26 NOTE — Care Management Important Message (Signed)
Important Message  Patient Details IM Letter given to the Patient. Name: Tiffany [name] MRN: [id number] Date of Birth: [birth year]   Medicare Important Message Given:  Yes     Kerin Salen 07/26/2020, 10:46 AM

## 2020-07-26 NOTE — NC FL2 (Signed)
[place] LEVEL OF CARE SCREENING TOOL     IDENTIFICATION  Patient Name: Tiffany [name] Birthdate: 12-22-1960 Sex: female Admission Date (Current Location): 07/22/2020  [facility] and [place] Number:  Herbalist and Address:  [facility],  [address] [place], [place]      Provider Number: [id number]  Attending Physician Name and Address:  Darliss Cheney, MD  Relative Name and Phone Number:  Marni Griffon [place](dtr) [phone]    Current Level of Care: Hospital Recommended Level of Care: [place] Prior Approval Number:    Date Approved/Denied:   PASRR Number:    Discharge Plan: SNF    Current Diagnoses: Patient Active Problem List   Diagnosis Date Noted   Protein-calorie malnutrition, severe 05/15/2020   Malnutrition of moderate degree 59/56/3875   Periumbilical abdominal pain    Dysphagia    Nausea vomiting and diarrhea    Generalized abdominal pain    E. coli UTI 64/33/2951   Acute metabolic encephalopathy 88/41/6606   Psychosis ([place]) 02/20/2020   Schizophrenia, schizo-affective ([place]) 30/16/0109   Metabolic acidosis 32/35/5732   Altered mental status    Hypocalcemia    AKI (acute kidney injury) ([place]) 02/18/2020   Pelvic mass in female 01/02/2020   Genital herpes 11/25/2019   Stage 2 skin ulcer of sacral region ([place]) 10/28/2019   ARF (acute renal failure) ([place]) 10/27/2019   Family discord 02/04/2019   Aggressive behavior    PTSD (post-traumatic stress disorder) 05/27/2018   Schizoaffective disorder, bipolar type ([place]) 04/05/2018   Closed displaced fracture of fifth metacarpal bone 03/21/2018   Difficulty with speech 01/24/2018   Encephalopathy 11/21/2017   Drug abuse ([place]) 11/21/2017   Frequent falls 10/11/2017   Binge eating disorder    Dependence on continuous supplemental oxygen 05/14/2017   Gout 04/11/2017   Hypomagnesemia    CKD (chronic kidney disease) stage 3, GFR 30-59 ml/min (HCC) 12/15/2016    Carotid artery stenosis    Hypokalemia    Osteoarthritis 10/26/2016   Anoxic brain injury ([place]) 09/08/2016   Overactive bladder 06/07/2016   QT prolongation    OSA and COPD overlap syndrome ([place])    Arthritis    Essential hypertension 03/22/2016   Cocaine use disorder, severe, in sustained remission ([place]) 12/17/2015   History of drug abuse in remission ([place]) 11/28/2015   Hyponatremia 11/25/2015   Chronic diastolic congestive heart failure (HCC)    Chronic respiratory failure with hypoxia and hypercapnia ([place]) 06/22/2015   Tobacco use disorder 07/22/2014   COPD (chronic obstructive pulmonary disease) ([place]) 07/08/2014   Seizure ([place]) 01/04/2013   Chronic pain syndrome 06/18/2012   Dyslipidemia 04/24/2011   Diabetic neuropathy ([place]) 04/24/2011   Morbid obesity ([place]) 10/18/2010   Type 2 diabetes mellitus ([place]) 10/18/2010    Orientation RESPIRATION BLADDER Height & Weight     Self, Time, Situation, Place  Normal   Weight: 54.2 kg Height:  5' 4.5" (163.8 cm)  BEHAVIORAL SYMPTOMS/MOOD NEUROLOGICAL BOWEL NUTRITION STATUS      Incontinent Diet (CHO MOD)  AMBULATORY STATUS COMMUNICATION OF NEEDS Skin   Limited Assist Verbally PU Stage and Appropriate Care (Foam dsg change every 3 days.)   PU Stage 2 Dressing:  (change every 3 days)                   Personal Care Assistance Level of Assistance  Bathing, Feeding, Dressing Bathing Assistance: Limited assistance Feeding assistance: Limited assistance Dressing Assistance: Limited assistance  Functional Limitations Info  Sight, Hearing, Speech Sight Info: Impaired (eyeglasses) Hearing Info: Adequate Speech Info: Impaired (slow with words.)    SPECIAL CARE FACTORS FREQUENCY  PT (By licensed PT), OT (By licensed OT)     PT Frequency:  (5x week) OT Frequency:  (5x week)            Contractures Contractures Info: Not present    Additional Factors Info  Code Status, Allergies, Psychotropic, Insulin Sliding Scale  Code Status Info:  (Full code) Allergies Info:  (Hydrocodone, Hydroxyzine, Latuda (Lurasidone Hcl), Magnesium-containing Compounds, Prednisone, Tramadol, Codeine, Sulfa Antibiotics, Tape) Psychotropic Info:  (Valium-See MAR) Insulin Sliding Scale Info:  (SSI)       Current Medications (07/26/2020):  This is the current hospital active medication list Current Facility-Administered Medications  Medication Dose Route Frequency Provider Last Rate Last Admin   0.9 %  sodium chloride infusion   Intravenous Continuous Darliss Cheney, MD 50 mL/hr at 07/26/20 1203 New Bag at 07/26/20 1203   acetaminophen (TYLENOL) tablet 650 mg  650 mg Oral Q6H PRN Blount, Lolita Cram, NP       dexamethasone (DECADRON) injection 4 mg  4 mg Intravenous Q12H Amin, Ankit Chirag, MD   4 mg at 07/26/20 0945   diazepam (VALIUM) tablet 10 mg  10 mg Oral TID PRN Darliss Cheney, MD       gabapentin (NEURONTIN) capsule 300 mg  300 mg Oral BID Darliss Cheney, MD   300 mg at 07/26/20 0945   heparin injection 5,000 Units  5,000 Units Subcutaneous Q8H Shela Leff, MD   5,000 Units at 07/26/20 0504   hydrALAZINE (APRESOLINE) injection 10 mg  10 mg Intravenous Q4H PRN Amin, Ankit Chirag, MD       ipratropium-albuterol (DUONEB) 0.5-2.5 (3) MG/3ML nebulizer solution 3 mL  3 mL Nebulization Q4H PRN Amin, Ankit Chirag, MD       lisdexamfetamine (VYVANSE) capsule 20 mg  20 mg Oral Daily PRN Darliss Cheney, MD       meclizine (ANTIVERT) tablet 12.5 mg  12.5 mg Oral Once Kathryne Eriksson, NP       metoprolol tartrate (LOPRESSOR) injection 5 mg  5 mg Intravenous Q4H PRN Amin, Ankit Chirag, MD       montelukast (SINGULAIR) tablet 10 mg  10 mg Oral QHS Pahwani, Einar Grad, MD   10 mg at 07/25/20 2212   oxybutynin (DITROPAN) tablet 5 mg  5 mg Oral BID Darliss Cheney, MD   5 mg at 07/26/20 0945   prazosin (MINIPRESS) capsule 5 mg  5 mg Oral BID Darliss Cheney, MD   5 mg at 07/26/20 0945     Discharge Medications: Please see discharge summary for a  list of discharge medications.  Relevant Imaging Results:  Relevant Lab Results:   Additional Information SS#  [id number]  Dessa Phi, RN
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
## 2020-07-26 NOTE — Care Management (Signed)
Transition of Care (TOC) -30 day Note       Patient Details   Name: Tiffany [name]  YYP:[id number]  Date of Birth:August 08, 1960     Transition of Care [facility]) CM/SW Contact   Name:Rudolfo Brandow Dearing  Phone Number:[phone]  Date:07/26/2020  Time:2:15p     MUST TM:[id number]     To Whom it May Concern:     Please be advised that the above patient will require a short-term nursing home stay, anticipated 30 days or less rehabilitation and strengthening. The plan is for return home.

## 2020-07-26 NOTE — Progress Notes (Signed)
Counselor called and left two vm with Client on how to connect. Counselor then saw in chart that Client is currently hospitalized for medical treatment, not behavioral health related. Counselor to follow up at next set appointment.   Lise Auer, LCSW

## 2020-07-27 ENCOUNTER — Other Ambulatory Visit: Payer: Self-pay | Admitting: Family Medicine

## 2020-07-27 ENCOUNTER — Telehealth: Payer: Self-pay | Admitting: Family Medicine

## 2020-07-27 DIAGNOSIS — K219 Gastro-esophageal reflux disease without esophagitis: Secondary | ICD-10-CM

## 2020-07-27 LAB — CBC
HCT: 26.6 % — ABNORMAL LOW (ref 36.0–46.0)
Hemoglobin: 8.9 g/dL — ABNORMAL LOW (ref 12.0–15.0)
MCH: 28.9 pg (ref 26.0–34.0)
MCHC: 33.5 g/dL (ref 30.0–36.0)
MCV: 86.4 fL (ref 80.0–100.0)
Platelets: 280 10*3/uL (ref 150–400)
RBC: 3.08 MIL/uL — ABNORMAL LOW (ref 3.87–5.11)
RDW: 15 % (ref 11.5–15.5)
WBC: 3.5 10*3/uL — ABNORMAL LOW (ref 4.0–10.5)
nRBC: 0 % (ref 0.0–0.2)

## 2020-07-27 LAB — BASIC METABOLIC PANEL
Anion gap: 6 (ref 5–15)
BUN: 12 mg/dL (ref 8–23)
CO2: 20 mmol/L — ABNORMAL LOW (ref 22–32)
Calcium: 9.4 mg/dL (ref 8.9–10.3)
Chloride: 102 mmol/L (ref 98–111)
Creatinine, Ser: 1.03 mg/dL — ABNORMAL HIGH (ref 0.44–1.00)
GFR, Estimated: >60 mL/min (ref >60)
Glucose, Bld: 103 mg/dL — ABNORMAL HIGH (ref 70–99)
Potassium: 4.7 mmol/L (ref 3.5–5.1)
Sodium: 128 mmol/L — ABNORMAL LOW (ref 135–145)

## 2020-07-27 LAB — MAGNESIUM: Magnesium: 1.5 mg/dL — ABNORMAL LOW (ref 1.7–2.4)

## 2020-07-27 LAB — GLUCOSE, CAPILLARY
Glucose-Capillary: 103 mg/dL — ABNORMAL HIGH (ref 70–99)
Glucose-Capillary: 105 mg/dL — ABNORMAL HIGH (ref 70–99)
Glucose-Capillary: 104 mg/dL — ABNORMAL HIGH (ref 70–99)
Glucose-Capillary: 102 mg/dL — ABNORMAL HIGH (ref 70–99)

## 2020-07-27 MED ORDER — ESCITALOPRAM OXALATE 10 MG PO TABS
10.0000 mg | ORAL_TABLET | Freq: Every day | ORAL | Status: DC
  Administered 2020-07-27 – 2020-07-28 (×2): 10 mg via ORAL
  Filled 2020-07-27 (×2): qty 1

## 2020-07-27 MED ORDER — UMECLIDINIUM BROMIDE 62.5 MCG/INH IN AEPB
1.0000 | INHALATION_SPRAY | Freq: Every day | RESPIRATORY_TRACT | Status: DC
  Administered 2020-07-27 – 2020-07-28 (×2): 1 via RESPIRATORY_TRACT
  Filled 2020-07-27: qty 7

## 2020-07-27 MED ORDER — PANTOPRAZOLE SODIUM 40 MG PO TBEC
40.0000 mg | DELAYED_RELEASE_TABLET | Freq: Every day | ORAL | Status: DC
  Administered 2020-07-27 – 2020-07-28 (×2): 40 mg via ORAL
  Filled 2020-07-27 (×2): qty 1

## 2020-07-27 MED ORDER — TOPIRAMATE 25 MG PO TABS
25.0000 mg | ORAL_TABLET | Freq: Two times a day (BID) | ORAL | Status: DC
  Administered 2020-07-27 – 2020-07-28 (×3): 25 mg via ORAL
  Filled 2020-07-27 (×3): qty 1

## 2020-07-27 MED ORDER — MAGNESIUM SULFATE 2 GM/50ML IV SOLN
2.0000 g | Freq: Once | INTRAVENOUS | Status: AC
  Administered 2020-07-27: 2 g via INTRAVENOUS
  Filled 2020-07-27: qty 50

## 2020-07-27 MED ORDER — AMLODIPINE BESYLATE 5 MG PO TABS
5.0000 mg | ORAL_TABLET | Freq: Every day | ORAL | Status: DC
  Administered 2020-07-27 – 2020-07-28 (×2): 5 mg via ORAL
  Filled 2020-07-27 (×2): qty 1

## 2020-07-27 MED ORDER — THIAMINE HCL 100 MG PO TABS
100.0000 mg | ORAL_TABLET | Freq: Every day | ORAL | Status: DC
  Administered 2020-07-27 – 2020-07-28 (×2): 100 mg via ORAL
  Filled 2020-07-27 (×2): qty 1

## 2020-07-27 MED ORDER — HYDRALAZINE HCL 25 MG PO TABS: 25.0000 mg | ORAL_TABLET | Freq: Four times a day (QID) | ORAL | Status: DC | PRN

## 2020-07-27 MED ORDER — FLUTICASONE FUROATE-VILANTEROL 100-25 MCG/INH IN AEPB
1.0000 | INHALATION_SPRAY | Freq: Every day | RESPIRATORY_TRACT | Status: DC
  Administered 2020-07-27 – 2020-07-28 (×2): 1 via RESPIRATORY_TRACT
  Filled 2020-07-27: qty 28

## 2020-07-27 MED ORDER — FLUTICASONE-UMECLIDIN-VILANT 100-62.5-25 MCG/INH IN AEPB: 1.0000 | INHALATION_SPRAY | Freq: Every day | RESPIRATORY_TRACT | Status: DC

## 2020-07-27 MED ORDER — ENSURE ENLIVE PO LIQD
237.0000 mL | Freq: Two times a day (BID) | ORAL | Status: DC
  Administered 2020-07-27: 237 mL via ORAL

## 2020-07-27 MED ORDER — ALBUTEROL SULFATE (2.5 MG/3ML) 0.083% IN NEBU: 2.5000 mg | INHALATION_SOLUTION | Freq: Four times a day (QID) | RESPIRATORY_TRACT | Status: DC | PRN

## 2020-07-27 MED ORDER — MONTELUKAST SODIUM 10 MG PO TABS
10.0000 mg | ORAL_TABLET | Freq: Every day | ORAL | Status: DC
  Filled 2020-07-27: qty 1

## 2020-07-27 NOTE — NC FL2 (Signed)
[place] LEVEL OF CARE SCREENING TOOL     IDENTIFICATION  Patient Name: Tiffany [name] Birthdate: 1960/08/28 Sex: female Admission Date (Current Location): 07/22/2020  [facility] and [place] Number:  Herbalist and Address:  [facility],  [place], [place]      Provider Number: [id number]  Attending Physician Name and Address:  Darliss Cheney, MD  Relative Name and Phone Number:  Marni Griffon Crawford(dtr) [phone]    Current Level of Care: Hospital Recommended Level of Care: [employer] Prior Approval Number:    Date Approved/Denied:   PASRR Number:  [id number])  Discharge Plan: SNF    Current Diagnoses: Patient Active Problem List   Diagnosis Date Noted   Protein-calorie malnutrition, severe 05/15/2020   Malnutrition of moderate degree 62/69/4854   Periumbilical abdominal pain    Dysphagia    Nausea vomiting and diarrhea    Generalized abdominal pain    E. coli UTI 62/70/3500   Acute metabolic encephalopathy 93/81/8299   Psychosis ([place]) 02/20/2020   Schizophrenia, schizo-affective ([place]) 37/16/9678   Metabolic acidosis 93/81/0175   Altered mental status    Hypocalcemia    AKI (acute kidney injury) ([place]) 02/18/2020   Pelvic mass in female 01/02/2020   Genital herpes 11/25/2019   Stage 2 skin ulcer of sacral region ([place]) 10/28/2019   ARF (acute renal failure) ([place]) 10/27/2019   Family discord 02/04/2019   Aggressive behavior    PTSD (post-traumatic stress disorder) 05/27/2018   Schizoaffective disorder, bipolar type ([place]) 04/05/2018   Closed displaced fracture of fifth metacarpal bone 03/21/2018   Difficulty with speech 01/24/2018   Encephalopathy 11/21/2017   Drug abuse ([place]) 11/21/2017   Frequent falls 10/11/2017   Binge eating disorder    Dependence on continuous supplemental oxygen 05/14/2017   Gout 04/11/2017   Hypomagnesemia    CKD (chronic kidney disease) stage 3, GFR 30-59 ml/min (HCC)  12/15/2016   Carotid artery stenosis    Hypokalemia    Osteoarthritis 10/26/2016   Anoxic brain injury ([place]) 09/08/2016   Overactive bladder 06/07/2016   QT prolongation    OSA and COPD overlap syndrome ([place])    Arthritis    Essential hypertension 03/22/2016   Cocaine use disorder, severe, in sustained remission ([place]) 12/17/2015   History of drug abuse in remission ([place]) 11/28/2015   Hyponatremia 11/25/2015   Chronic diastolic congestive heart failure (HCC)    Chronic respiratory failure with hypoxia and hypercapnia ([place]) 06/22/2015   Tobacco use disorder 07/22/2014   COPD (chronic obstructive pulmonary disease) ([place]) 07/08/2014   Seizure ([place]) 01/04/2013   Chronic pain syndrome 06/18/2012   Dyslipidemia 04/24/2011   Diabetic neuropathy ([place]) 04/24/2011   Morbid obesity ([place]) 10/18/2010   Type 2 diabetes mellitus ([place]) 10/18/2010    Orientation RESPIRATION BLADDER Height & Weight     Self, Time, Situation, Place  Normal   Weight: 54.2 kg Height:  5' 4.5" (163.8 cm)  BEHAVIORAL SYMPTOMS/MOOD NEUROLOGICAL BOWEL NUTRITION STATUS      Incontinent Diet (CHO MOD)  AMBULATORY STATUS COMMUNICATION OF NEEDS Skin   Limited Assist Verbally PU Stage and Appropriate Care (Foam dsg change every 3 days.)   PU Stage 2 Dressing:  (change every 3 days)                   Personal Care Assistance Level of Assistance  Bathing, Feeding, Dressing Bathing Assistance: Limited assistance Feeding assistance: Limited assistance Dressing Assistance: Limited assistance  Functional Limitations Info  Sight, Hearing, Speech Sight Info: Impaired (eyeglasses) Hearing Info: Adequate Speech Info: Impaired (slow with words.)    SPECIAL CARE FACTORS FREQUENCY  PT (By licensed PT), OT (By licensed OT)     PT Frequency:  (5x week) OT Frequency:  (5x week)            Contractures Contractures Info: Not present    Additional Factors Info  Code Status, Allergies, Psychotropic, Insulin  Sliding Scale Code Status Info:  (Full code) Allergies Info:  (Hydrocodone, Hydroxyzine, Latuda (Lurasidone Hcl), Magnesium-containing Compounds, Prednisone, Tramadol, Codeine, Sulfa Antibiotics, Tape) Psychotropic Info:  (Valium-See MAR) Insulin Sliding Scale Info:  (SSI)       Current Medications (07/27/2020):  This is the current hospital active medication list Current Facility-Administered Medications  Medication Dose Route Frequency Provider Last Rate Last Admin   0.9 %  sodium chloride infusion   Intravenous Continuous Darliss Cheney, MD 50 mL/hr at 07/26/20 1203 New Bag at 07/26/20 1203   acetaminophen (TYLENOL) tablet 650 mg  650 mg Oral Q6H PRN Lovey Newcomer T, NP   650 mg at 07/27/20 1035   amLODipine (NORVASC) tablet 5 mg  5 mg Oral Daily Darliss Cheney, MD   5 mg at 07/27/20 1035   dexamethasone (DECADRON) injection 4 mg  4 mg Intravenous Q12H Amin, Ankit Chirag, MD   4 mg at 07/27/20 1034   diazepam (VALIUM) tablet 10 mg  10 mg Oral TID PRN Darliss Cheney, MD       escitalopram (LEXAPRO) tablet 10 mg  10 mg Oral Daily Pahwani, Einar Grad, MD   10 mg at 07/27/20 1034   feeding supplement (ENSURE ENLIVE / ENSURE PLUS) liquid 237 mL  237 mL Oral BID BM Pahwani, Ravi, MD   237 mL at 07/27/20 1040   fluticasone furoate-vilanterol (BREO ELLIPTA) 100-25 MCG/INH 1 puff  1 puff Inhalation Daily Pahwani, Ravi, MD       And   umeclidinium bromide (INCRUSE ELLIPTA) 62.5 MCG/INH 1 puff  1 puff Inhalation Daily Pahwani, Ravi, MD       gabapentin (NEURONTIN) capsule 300 mg  300 mg Oral BID Darliss Cheney, MD   300 mg at 07/27/20 1035   heparin injection 5,000 Units  5,000 Units Subcutaneous Q8H Shela Leff, MD   5,000 Units at 07/27/20 0531   hydrALAZINE (APRESOLINE) injection 10 mg  10 mg Intravenous Q4H PRN Damita Lack, MD   10 mg at 07/26/20 1904   hydrALAZINE (APRESOLINE) tablet 25 mg  25 mg Oral Q6H PRN Pahwani, Einar Grad, MD       ipratropium-albuterol (DUONEB) 0.5-2.5 (3) MG/3ML nebulizer  solution 3 mL  3 mL Nebulization Q4H PRN Amin, Ankit Chirag, MD       lisdexamfetamine (VYVANSE) capsule 20 mg  20 mg Oral Daily PRN Darliss Cheney, MD       meclizine (ANTIVERT) tablet 12.5 mg  12.5 mg Oral Once Kathryne Eriksson, NP       metoprolol tartrate (LOPRESSOR) injection 5 mg  5 mg Intravenous Q4H PRN Amin, Ankit Chirag, MD   5 mg at 07/27/20 1034   montelukast (SINGULAIR) tablet 10 mg  10 mg Oral QHS Pahwani, Einar Grad, MD   10 mg at 07/26/20 2057   montelukast (SINGULAIR) tablet 10 mg  10 mg Oral QHS Pahwani, Einar Grad, MD       oxybutynin (DITROPAN) tablet 5 mg  5 mg Oral BID Darliss Cheney, MD   5 mg at 07/27/20 1035   pantoprazole (  PROTONIX) EC tablet 40 mg  40 mg Oral Daily Darliss Cheney, MD   40 mg at 07/27/20 1034   prazosin (MINIPRESS) capsule 5 mg  5 mg Oral BID Darliss Cheney, MD   5 mg at 07/27/20 1034   thiamine tablet 100 mg  100 mg Oral Daily Pahwani, Einar Grad, MD   100 mg at 07/27/20 1035   topiramate (TOPAMAX) tablet 25 mg  25 mg Oral BID Darliss Cheney, MD   25 mg at 07/27/20 1035     Discharge Medications: Please see discharge summary for a list of discharge medications.  Relevant Imaging Results:  Relevant Lab Results:   Additional Information SS#  [id number]  Dessa Phi, RN

## 2020-07-27 NOTE — Telephone Encounter (Signed)
Medication Refill - Medication: gabapentin (NEURONTIN) capsule 300 mg    Has the patient contacted their pharmacy? No. (Agent: If no, request that the patient contact the pharmacy for the refill.) (Agent: If yes, when and what did the pharmacy advise?)  Preferred Pharmacy (with phone number or street name):  [facility] [phone] - [place], [place] [address]  [place] [address]  Phone: [phone] Fax: [phone]    Agent: Please be advised that RX refills may take up to 3 business days. We ask that you follow-up with your pharmacy.

## 2020-07-27 NOTE — Progress Notes (Signed)
Physical Therapy Treatment Patient Details Name: Tiffany [name] MRN: [id number] DOB: 1960-07-28 Today's Date: 07/27/2020    History of Present Illness  60 y.o. female  presented to the ED with complaints of left lower quadrant abdominal pain, vomiting, diarrhea, and multiple falls  adm  with hyponatremia. PMH: significant of asthma, COPD, chronic respiratory failure on home oxygen, OSA, chronic diastolic CHF, hypertension, hyperlipidemia, type 2 diabetes, CVA, cardiac arrest in 2018, history of cocaine use, CKD stage III, anemia, retinopathy, gout, pelvic mass, chronic left heel ulcer, chronic pain syndrome, GERD, schizoaffective disorder bipolar type, PTSD, MDD, anxiety    PT Comments    Patient self limiting mobility to supine<>sit transfer despite education on benefits of mobilizing. Patient required min assist for supine<>sit and mod assist for scooting to EOB. Patient agitated and not agreeable to reposition in bed by standing. Requested NT to assist at EOS, min assist to return to supine. Acute PT will follow in acute setting and progress pending pt's willingness to participate in skilled PT services. Recommend 24/assist and rehab follow up at SNF.    Follow Up Recommendations   SNF (HHPT if pt not agreeable to SNF)     Equipment Recommendations    None   Recommendations for Other Services       Precautions / Restrictions        07/27/20 1100  PT Visit Information  Last PT Received On 07/27/20  Assistance Needed +1 (? +2 for progression)  History of Present Illness 60 y.o. female  presented to the ED with complaints of left lower quadrant abdominal pain, vomiting, diarrhea, and multiple falls  adm  with hyponatremia. PMH: significant of asthma, COPD, chronic respiratory failure on home oxygen, OSA, chronic diastolic CHF, hypertension, hyperlipidemia, type 2 diabetes, CVA, cardiac arrest in 2018, history of cocaine use, CKD stage III, anemia, retinopathy, gout, pelvic mass,  chronic left heel ulcer, chronic pain syndrome, GERD, schizoaffective disorder bipolar type, PTSD, MDD, anxiety  Subjective Data  Patient Stated Goal pt requesting to sleep  Precautions  Precautions Fall  Restrictions  Weight Bearing Restrictions No  Pain Assessment  Pain Assessment Faces  Faces Pain Scale 2  Pain Location generalized  Pain Descriptors / Indicators Grimacing;Guarding  Pain Intervention(s) Limited activity within patient's tolerance;Monitored during session;Repositioned  Cognition  Arousal/Alertness Awake/alert  Behavior During Therapy Agitated;Flat affect  Overall Cognitive Status No family/caregiver present to determine baseline cognitive functioning  General Comments pt easily agitated and requires encouragement to mobilize  Bed Mobility  Overal bed mobility Needs Assistance  Bed Mobility Supine to Sit;Sit to Supine  Supine to sit Min assist;HOB elevated  Sit to supine Min assist;HOB elevated  General bed mobility comments Mod cues to encourage mobility to EOB. min assist to sequence bring bil LE's on/off EOB. Mod assist with use of bed pad to scoot to EOB. pt reporting she is too week to scoot herself.  Transfers  General transfer comment pt declined repeatedly despite education on benefits and encouragement to [facility]  Balance  Overall balance assessment Needs assistance  Sitting-balance support No upper extremity supported;Feet unsupported  Sitting balance-Leahy Scale Fair  Standing balance comment pt decline to attempt standing  PT - End of Session  Activity Tolerance Treatment limited secondary to agitation  Patient left in bed;with call bell/phone within reach;with nursing/sitter in room  Nurse Communication Mobility status   PT - Assessment/Plan  PT Plan Current plan remains appropriate  PT Visit Diagnosis Other abnormalities of gait and mobility (R26.89);Muscle weakness (  generalized) (M62.81)  PT Frequency (ACUTE ONLY) Min 2X/week  Follow Up  Recommendations SNF (HHPT if pt not agreeable to SNF)  PT equipment None recommended by PT  AM-PAC PT "6 Clicks" Mobility Outcome Measure (Version 2)  Help needed turning from your back to your side while in a flat bed without using bedrails? 3  Help needed moving from lying on your back to sitting on the side of a flat bed without using bedrails? 3  Help needed moving to and from a bed to a chair (including a wheelchair)? 2  Help needed standing up from a chair using your arms (e.g., wheelchair or bedside chair)? 2  Help needed to walk in hospital room? 2  Help needed climbing 3-5 steps with a railing?  1  6 Click Score 13  Consider Recommendation of Discharge To: CIR/SNF/LTACH  PT Goal Progression  Progress towards PT goals Progressing toward goals  Acute Rehab PT Goals  PT Goal Formulation With patient  Time For Goal Achievement 08/07/20  Potential to Achieve Goals Good  PT Time Calculation  PT Start Time (ACUTE ONLY) 1059  PT Stop Time (ACUTE ONLY) 1117  PT Time Calculation (min) (ACUTE ONLY) 18 min  PT General Charges  $$ ACUTE PT VISIT 1 Visit  PT Treatments  $Therapeutic Activity 8-22 mins     Verner Mould, DPT Acute Rehabilitation Services Office [phone] Pager [phone]    Jacques Navy 07/27/2020, 3:15 PM

## 2020-07-27 NOTE — Telephone Encounter (Signed)
  Notes to clinic:  Patient requests 90 days supply   Requested Prescriptions  Pending Prescriptions Disp Refills   montelukast (SINGULAIR) 10 MG tablet [Pharmacy Med Name: MONTELUKAST 10MG  TABLETS] 90 tablet     Sig: TAKE 1 TABLET(10 MG) BY MOUTH AT BEDTIME      Pulmonology:  Leukotriene Inhibitors Passed - 07/27/2020  8:50 AM      Passed - Valid encounter within last 12 months    Recent Outpatient Visits           1 month ago Heel ulcer, left, with fat layer exposed ([place])   [place], Charlane Ferretti, MD   2 months ago Edema of both legs   [place], Deborah B, MD   3 months ago Pelvic mass in female   [place], Enobong, MD   7 months ago Overactive bladder   [place], MD   1 year ago Refused assessment of physical health   [facility], Delfino Lovett, NP       Future Appointments             In 2 weeks [name], MD [place]   In 4 weeks [name], Nicki Reaper T, PA-C [place]               omeprazole (PRILOSEC) 40 MG capsule [place] Med Name: OMEPRAZOLE 40MG  CAPSULES] 90 capsule     Sig: TAKE 1 CAPSULE(40 MG) BY MOUTH DAILY      Gastroenterology: Proton Pump Inhibitors Passed - 07/27/2020  8:50 AM      Passed - Valid encounter within last 12 months    Recent Outpatient Visits           1 month ago Heel ulcer, left, with fat layer exposed ([place])   [place], Charlane Ferretti, MD   2 months ago Edema of both legs   [place], Deborah B, MD   3 months ago Pelvic mass in female   [place], Enobong, MD   7 months ago Overactive bladder   [place], MD   1 year ago Refused  assessment of physical health   [facility], Delfino Lovett, NP       Future Appointments             In 2 weeks [name], MD [place]   In 4 weeks Richardson Dopp T, PA-C [place], [facility]

## 2020-07-27 NOTE — TOC Progression Note (Addendum)
Transition of Care [facility]) - Progression Note    Patient Details  Name: Tiffany [name] MRN: [id number] Date of Birth: 06/06/1960  Transition of [facility]) CM/SW Contact  Kairos Panetta, Juliann Pulse, RN Phone Number: 07/27/2020, 11:46 AM  Clinical Narrative: Bed offers given await choice.  1. 1.3 mi [facility] and [place], [place] [address] [phone] Overall rating Above average 2. 1.6 mi [place] at [place], [place] [address] [phone] Overall rating Much below average 3. 2.1 mi [place], [place] [address] [phone] Overall rating Much below average 4. 2.5 mi [facility] [place], [place] [address] [phone] Overall rating Average 5. 2.8 mi [facility] at the [place], [place] [address] [phone] Overall rating Above average 6. 2.8 mi [place] [address] [place], [place] [address] [phone] Overall rating Average 7. 3 mi [facility] [place], [place] [address] [phone] Overall rating Much above average 8. 3.6 [place] [address] [place], [place] [address] [phone] Overall rating Average 9. 3.6 mi [facility] [address] [place], [place] [address] [phone] Overall rating Below average 10. 3.9 mi [facility] [place], [place] [address] [phone] Overall rating Below average 11. 4.4 mi [facility] at [place], [place] [address] [phone] Overall rating Much above average 12. 4.6 mi [facility] [address] [place], [place] [address] [phone] Overall rating Above average 13. 5.5 mi [facility] [address] [place], [place] [address] [phone] Overall rating Above average 14. 8.2 [facility] [place], [place] [address] [phone] Overall rating Much above average 15. 9 mi [facility] [address] [place], [place] [address] [phone] Overall rating Average 16. 9.1 mi [facility] and [place], [place] [address] [phone] Overall rating Below average 17. 9.2 mi [facility] [address] [place], [place] [address] [phone] Overall rating Much above average 18. 10.8 mi [place] at [facility] [address] [place], [place] [address] [phone] Overall rating Much above average 19. 12.6 mi [facility] [address] [place], [place] [address] [phone] Overall rating Much below average 20. 12.8 [facility] [place], [place] [address] [phone] Overall rating Much below average 21. 14.2 mi The [place] [address] [place], [place] [address] [phone] Overall rating Much below average 22. 14.4 mi [name] at [place], [place] [address] [phone] Overall rating Average 23. 14.8 mi [place] and [facility] [place], [place] [address] [phone] Overall rating Above average 24. 14.9 [place] [address] [place], [place] [address] [phone] Overall rating Much below average 25. 16.5 mi [place] [name] [place], [place] [address] [phone] Overall rating Above average 26. 16.7 mi [facility] [place], [place] [address] [phone] Overall  rating Much above average 27. 17.9 mi [place] [address]  [place], [place] [address] [phone] Overall rating Average 28. 43.1 [facility] [address] [place], [place] [address] [phone] Overall rating Much below average 29. 19.7 mi [place] [address] [place], [place] [address] [phone] Overall rating Much below average 30. 20 mi [address] at the [facility], [place] [address] [phone] Overall rating Much above average 31. 21.1 mi [facility] and [facility] [place], [place] [address] [phone] Overall rating Much below average 32. 21.6 [address] [place], [place] [address] [phone] Overall rating Below average 33. 21.1 [facility] [address] [place], [place] [address] [phone] Overall rating Below average 34. 21.8 [place], [place] [address] [phone] Overall rating Much above average 35. [address] [place], [place] [address] [phone] Overall rating Much above average 36. 22.6 mi [facility] [address] [place], [place] [address] [phone] Overall rating Average 37. 22.7 mi [facility] [place], [place] [address] [phone] Overall rating Much below average 38. 23.3 mi [facility] [place] [address] [place], [place] [address] [phone] Overall rating Average 39. 23.5 mi [place], [place] [address] [phone] Overall rating Much below average 40. 24.1 mi [place] [address] [place], [place] [address] [phone] Overall rating Below average 41. 24.2  mi [facility] and [facility] [address] [place], [place] [address] [phone] Overall rating Below average 42. 24.4 [facility] [address] [place], [place] [address] [phone] Overall rating Much below average 43. 24.5 mi [facility] [place], [place] [address] [phone] Overall rating Average To explore and download nursing home data,visit the data catalog on CMS.gov     Expected Discharge Plan: [name] Barriers to Discharge: Continued Medical Work up  Expected Discharge Plan and Services Expected Discharge Plan: [place]   Discharge Planning Services: CM Consult Post Acute Care Choice: [place] Living arrangements for the past 2 months: Single Family Home                                       Social Determinants of Health (SDOH) Interventions    Readmission Risk Interventions Readmission Risk Prevention Plan 07/26/2020 02/23/2020  Transportation Screening Complete Complete  Medication Review Press photographer) Complete Complete  PCP or Specialist appointment within 3-5 days of discharge Complete Complete  HRI or Home Care Consult Complete Complete  SW Recovery Care/Counseling Consult Complete Complete  Palliative Care Screening Not Applicable Complete  Skilled Nursing Facility Complete Complete  Some recent data might be hidden

## 2020-07-27 NOTE — Plan of Care (Signed)
  Problem: Clinical Measurements: Goal: Diagnostic test results will improve Outcome: Progressing   Problem: Nutrition: Goal: Adequate nutrition will be maintained Outcome: Progressing   Problem: Coping: Goal: Level of anxiety will decrease Outcome: Progressing   
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
## 2020-07-28 LAB — BASIC METABOLIC PANEL
Anion gap: 6 (ref 5–15)
BUN: 13 mg/dL (ref 8–23)
CO2: 21 mmol/L — ABNORMAL LOW (ref 22–32)
Calcium: 9.2 mg/dL (ref 8.9–10.3)
Chloride: 100 mmol/L (ref 98–111)
Creatinine, Ser: 1.21 mg/dL — ABNORMAL HIGH (ref 0.44–1.00)
GFR, Estimated: 51 mL/min — ABNORMAL LOW (ref >60)
Glucose, Bld: 93 mg/dL (ref 70–99)
Potassium: 4.4 mmol/L (ref 3.5–5.1)
Sodium: 127 mmol/L — ABNORMAL LOW (ref 135–145)

## 2020-07-28 LAB — CULTURE, BLOOD (ROUTINE X 2)
Culture: NO GROWTH
Special Requests: ADEQUATE

## 2020-07-28 LAB — CBC
HCT: 25.8 % — ABNORMAL LOW (ref 36.0–46.0)
Hemoglobin: 8.4 g/dL — ABNORMAL LOW (ref 12.0–15.0)
MCH: 28.6 pg (ref 26.0–34.0)
MCHC: 32.6 g/dL (ref 30.0–36.0)
MCV: 87.8 fL (ref 80.0–100.0)
Platelets: 299 10*3/uL (ref 150–400)
RBC: 2.94 MIL/uL — ABNORMAL LOW (ref 3.87–5.11)
RDW: 15.2 % (ref 11.5–15.5)
WBC: 4.1 10*3/uL (ref 4.0–10.5)
nRBC: 0 % (ref 0.0–0.2)

## 2020-07-28 LAB — GLUCOSE, CAPILLARY
Glucose-Capillary: 95 mg/dL (ref 70–99)
Glucose-Capillary: 89 mg/dL (ref 70–99)

## 2020-07-28 LAB — MAGNESIUM: Magnesium: 1.7 mg/dL (ref 1.7–2.4)

## 2020-07-28 MED ORDER — AMLODIPINE BESYLATE 5 MG PO TABS: 5.0000 mg | ORAL_TABLET | Freq: Every day | ORAL | 0 refills | Status: DC

## 2020-07-28 NOTE — Progress Notes (Signed)
Pt initially placed on niv / avaps mode with nasal mask per home regimen.  RN notified this Probation officer that bipap was alarming.  Upon arrival to pt bedside, pt had mouth open with large leak noted and low tidal volumes on bipap.  Pt refuses to try a full face mask and asked that I remove the nasal mask instead.  Mask removed per pt request.  Machine remains in room on standby as needed.  RN aware.

## 2020-07-28 NOTE — Plan of Care (Signed)
  Problem: Clinical Measurements: Goal: Diagnostic test results will improve Outcome: Progressing   Problem: Nutrition: Goal: Adequate nutrition will be maintained Outcome: Progressing   Problem: Coping: Goal: Level of anxiety will decrease Outcome: Progressing   Problem: Skin Integrity: Goal: Risk for impaired skin integrity will decrease Outcome: Progressing
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
## 2020-07-28 NOTE — Discharge Summary (Signed)
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   Norovirus GI/GII NOT DETECTED NOT DETECTED Final   Rotavirus A NOT DETECTED NOT DETECTED Final   Sapovirus (I, II, IV, and V) NOT DETECTED NOT DETECTED Final    Comment: Performed at [facility], [address], [place], [place] [address]  Labs: BNP (last 3 results) Recent Labs    02/18/20 1335  BNP 027.7*   Basic Metabolic Panel: Recent Labs  Lab 07/23/20 1420 07/23/20 2009 07/24/20 0211 07/24/20 0726 07/25/20 0439 07/25/20 0750  07/25/20 1832 07/26/20 0420 07/26/20 1652 07/27/20 0426 07/28/20 0430 07/28/20 0816  NA 120*   < > 123*   < >  --    < > 126* 128* 127* 128*  --  127*  K <2.0*   < > 2.3*   < >  --    < > 4.5 4.6 4.7 4.7  --  4.4  CL 83*   < > 88*   < >  --    < > 99 100 102 102  --  100  CO2 25   < > 22   < >  --    < > 20* 21* 20* 20*  --  21*  GLUCOSE 83   < > 118*   < >  --    < > 100* 103* 108* 103*  --  93  BUN 23   < > 19   < >  --    < > '10 12 12 12  ' --  13  CREATININE 2.07*   < > 1.69*   < >  --    < > 1.36* 1.30* 1.29* 1.03*  --  1.21*  CALCIUM 8.0*   < > 8.4*   < >  --    < > 9.1 9.0 9.2 9.4  --  9.2  MG  --   --  1.5*  --  1.6*  --   --  1.8  --  1.5* 1.7  --   PHOS 2.8  --   --   --   --   --   --   --   --   --   --   --    < > = values in this interval not displayed.   Liver Function Tests: Recent Labs  Lab 07/22/20 2236 07/23/20 0340  AST 61* 48*  ALT 19 14  ALKPHOS 175* 153*  BILITOT 0.8 0.7  PROT 7.3 6.7  ALBUMIN 4.0 3.7   Recent Labs  Lab 07/22/20 2236  LIPASE 23   Recent Labs  Lab 07/23/20 0531  AMMONIA 15   CBC: Recent Labs  Lab 07/22/20 2236 07/23/20 0340 07/24/20 0211 07/25/20 0439 07/26/20 0420 07/27/20 0426 07/28/20 0430  WBC 2.7*   < > 2.6* 3.4* 3.6* 3.5* 4.1  NEUTROABS 1.7  --   --   --   --   --   --   HGB 12.4   < > 10.2* 8.8* 8.5* 8.9* 8.4*  HCT 34.7*   < > 29.4* 26.1* 25.8* 26.6* 25.8*  MCV 79.6*   < > 82.4 85.0 85.7 86.4 87.8  PLT 255   < > 228 216 237 280 299   < > = values in this interval not displayed.   Cardiac Enzymes: No results for input(s): CKTOTAL, CKMB, CKMBINDEX, TROPONINI in the last 168 hours. BNP: Invalid input(s): POCBNP CBG: Recent Labs  Lab 07/27/20 0729 07/27/20 1125 07/27/20 1636 07/27/20 2000 07/28/20 0723  GLUCAP 103* 105* 104* 102* 95   D-Dimer No results for input(s): DDIMER in the last 72 hours. Hgb A1c No results for input(s): HGBA1C in the last 72 hours. Lipid Profile No results for input(s): CHOL,  HDL, LDLCALC, TRIG, CHOLHDL, LDLDIRECT in the last 72 hours. Thyroid function  studies No results for input(s): TSH, T4TOTAL, T3FREE, THYROIDAB in the last 72 hours.  Invalid input(s): FREET3 Anemia work up No results for input(s): VITAMINB12, FOLATE, FERRITIN, TIBC, IRON, RETICCTPCT in the last 72 hours. Urinalysis    Component Value Date/Time   COLORURINE YELLOW 07/23/2020 0000   APPEARANCEUR HAZY (A) 07/23/2020 0000   LABSPEC 1.006 07/23/2020 0000   PHURINE 5.0 07/23/2020 0000   GLUCOSEU NEGATIVE 07/23/2020 0000   HGBUR MODERATE (A) 07/23/2020 0000   BILIRUBINUR NEGATIVE 07/23/2020 0000   BILIRUBINUR NEG 05/21/2014 1155   KETONESUR NEGATIVE 07/23/2020 0000   PROTEINUR NEGATIVE 07/23/2020 0000   UROBILINOGEN 0.2 09/30/2019 1557   NITRITE NEGATIVE 07/23/2020 0000   LEUKOCYTESUR MODERATE (A) 07/23/2020 0000   Sepsis Labs Invalid input(s): PROCALCITONIN,  WBC,  LACTICIDVEN Microbiology Recent Results (from the past 240 hour(s))  Resp Panel by RT-PCR (Flu A&B, Covid) Nasopharyngeal Swab     Status: None   Collection Time: 07/22/20 11:13 PM   Specimen: Nasopharyngeal Swab; Nasopharyngeal(NP) swabs in vial transport medium  Result Value Ref Range Status   SARS Coronavirus 2 by RT PCR NEGATIVE NEGATIVE Final    Comment: (NOTE) SARS-CoV-2 target nucleic acids are NOT DETECTED.  The SARS-CoV-2 RNA is generally detectable in upper respiratory specimens during the acute phase of infection. The lowest concentration of SARS-CoV-2 viral copies this assay can detect is 138 copies/mL. A negative result does not preclude SARS-Cov-2 infection and should not be used as the sole basis for treatment or other patient management decisions. A negative result may occur with  improper specimen collection/handling, submission of specimen other than nasopharyngeal swab, presence of viral mutation(s) within the areas targeted by this assay, and inadequate number of viral copies(<138 copies/mL). A  negative result must be combined with clinical observations, patient history, and epidemiological information. The expected result is Negative.  Fact Sheet for Patients:  EntrepreneurPulse.com.au  Fact Sheet for Healthcare Providers:  IncredibleEmployment.be  This test is no t yet approved or cleared by the [place] FDA and  has been authorized for detection and/or diagnosis of SARS-CoV-2 by FDA under an Emergency Use Authorization (EUA). This EUA will remain  in effect (meaning this test can be used) for the duration of the COVID-19 declaration under Section 564(b)(1) of the Act, 21 U.S.C.section 360bbb-3(b)(1), unless the authorization is terminated  or revoked sooner.       Influenza A by PCR NEGATIVE NEGATIVE Final   Influenza B by PCR NEGATIVE NEGATIVE Final    Comment: (NOTE) The Xpert Xpress SARS-CoV-2/FLU/RSV plus assay is intended as an aid in the diagnosis of influenza from Nasopharyngeal swab specimens and should not be used as a sole basis for treatment. Nasal washings and aspirates are unacceptable for Xpert Xpress SARS-CoV-2/FLU/RSV testing.  Fact Sheet for Patients: EntrepreneurPulse.com.au  Fact Sheet for Healthcare Providers: IncredibleEmployment.be  This test is not yet approved or cleared by the [place] FDA and has been authorized for detection and/or diagnosis of SARS-CoV-2 by FDA under an Emergency Use Authorization (EUA). This EUA will remain in effect (meaning this test can be used) for the duration of the COVID-19 declaration under Section 564(b)(1) of the Act, 21 U.S.C. section 360bbb-3(b)(1), unless the authorization is terminated or revoked.  Performed at [facility], [place] [address], [place], [place] [address]   Urine culture     Status: Abnormal   Collection Time: 07/23/20 12:45 AM   Specimen: Urine, Clean Catch  Result Value Ref Range  Status   Specimen  Description   Final    URINE, CLEAN CATCH Performed at [facility], [place] [address], [place], [place] [address]    Special Requests   Final    NONE Performed at [facility], [place] [address], [place], [place] [address]    Culture >=100,000 COLONIES/mL ESCHERICHIA COLI (A)  Final   Report Status 07/25/2020 FINAL  Final   Organism ID, Bacteria ESCHERICHIA COLI (A)  Final      Susceptibility   Escherichia coli - MIC*    AMPICILLIN >=32 RESISTANT Resistant     CEFAZOLIN <=4 SENSITIVE Sensitive     CEFEPIME <=0.12 SENSITIVE Sensitive     CEFTRIAXONE <=0.25 SENSITIVE Sensitive     CIPROFLOXACIN 1 SENSITIVE Sensitive     GENTAMICIN <=1 SENSITIVE Sensitive     IMIPENEM <=0.25 SENSITIVE Sensitive     NITROFURANTOIN <=16 SENSITIVE Sensitive     TRIMETH/SULFA >=320 RESISTANT Resistant     AMPICILLIN/SULBACTAM 16 INTERMEDIATE Intermediate     PIP/TAZO <=4 SENSITIVE Sensitive     * >=100,000 COLONIES/mL ESCHERICHIA COLI  Blood culture (routine x 2)     Status: None   Collection Time: 07/23/20  2:28 AM   Specimen: BLOOD  Result Value Ref Range Status   Specimen Description   Final    BLOOD BLOOD RIGHT FOREARM Performed at [facility] [address] [place], [place] [address]    Special Requests   Final    BOTTLES DRAWN AEROBIC AND ANAEROBIC Blood Culture adequate volume Performed at [facility] [address] [place], [place] [address]    Culture   Final    NO GROWTH 5 DAYS Performed at [facility], [place] [address] [place], [place] [address]    Report Status 07/28/2020 FINAL  Final  C Difficile Quick Screen w PCR reflex     Status: None   Collection Time: 07/25/20 12:50 PM   Specimen: STOOL  Result Value Ref Range Status   C Diff antigen NEGATIVE NEGATIVE Final   C Diff toxin NEGATIVE NEGATIVE Final   C Diff interpretation No C. difficile detected.  Final    Comment: Performed  at [facility], [place] [address], [place], [place] [address]  Gastrointestinal Panel by PCR , Stool     Status: None   Collection Time: 07/25/20 12:50 PM   Specimen: STOOL  Result Value Ref Range Status   Campylobacter species NOT DETECTED NOT DETECTED Final   Plesimonas shigelloides NOT DETECTED NOT DETECTED Final   Salmonella species NOT DETECTED NOT DETECTED Final   Yersinia enterocolitica NOT DETECTED NOT DETECTED Final   Vibrio species NOT DETECTED NOT DETECTED Final   Vibrio cholerae NOT DETECTED NOT DETECTED Final   Enteroaggregative E coli (EAEC) NOT DETECTED NOT DETECTED Final   Enteropathogenic E coli (EPEC) NOT DETECTED NOT DETECTED Final   Enterotoxigenic E coli (ETEC) NOT DETECTED NOT DETECTED Final   Shiga like toxin producing E coli (STEC) NOT DETECTED NOT DETECTED Final   Shigella/Enteroinvasive E coli (EIEC) NOT DETECTED NOT DETECTED Final   Cryptosporidium NOT DETECTED NOT DETECTED Final   Cyclospora cayetanensis NOT DETECTED NOT DETECTED Final   Entamoeba histolytica NOT DETECTED NOT DETECTED Final   Giardia lamblia NOT DETECTED NOT DETECTED Final   Adenovirus F40/41 NOT DETECTED NOT DETECTED Final   Astrovirus NOT DETECTED NOT DETECTED Final   Norovirus GI/GII NOT DETECTED NOT DETECTED Final   Rotavirus A NOT DETECTED NOT DETECTED Final  Sapovirus (I, II, IV, and V) NOT DETECTED NOT DETECTED Final    Comment: Performed at [facility], [place] [place], [place] [address]     Time coordinating discharge: Over 30 minutes  SIGNED:   Darliss Cheney, MD  [facility] 07/28/2020, 11:20 AM  If 7PM-7AM, please contact night-coverage [email or web address]

## 2020-07-29 ENCOUNTER — Telehealth: Payer: Self-pay

## 2020-07-29 DIAGNOSIS — R296 Repeated falls: Secondary | ICD-10-CM

## 2020-07-29 NOTE — Telephone Encounter (Signed)
Transition Care Management Follow-up Telephone Call Date of discharge and from where: 07/28/2020, [facility]  How have you been since you were released from the hospital? She said she is not feeling good. She needs home therapy.She called [facility], who accepted the referral when she was in the hospital, and was told that they are now not able to accept the referral because he staffing. The patient explained that she did not want to go to SNF  - she did not like the facility options that were available.  [name] living alone and depends on her personal care aide to provide assistance but that is very limited. She said that she can't really use her legs and last night she ended up on the floor and had to call EMS to help get her up.  This CM sent message to Dessa Phi, RN CM to inquire about the outcome of the home health referral.  Any questions or concerns? Yes - noted above.  [name] also requesting a Life Alert. Instructed her to contact her insurance company to inquire if they offer the Life Alert or similar system as a benefit.    Items Reviewed: Did the pt receive and understand the discharge instructions provided? Yes  Medications obtained and verified? Yes  - she said she has all medications including the  amlodipine and she didn't have any questions about the med regime.  Other? No  Any new allergies since your discharge? No  Do you have support at home?  Lives alone  ,minimal support  Home Care and Equipment/Supplies: Were home health services ordered? yes If so, what is the name of the agency? [facility]  Has the agency set up a time to come to the patient's home? Information noted above regarding Alvis Lemmings Were any new equipment or medical supplies ordered?  RW What is the name of the medical supply agency? [facility] - she said that she paid for the walker Were you able to get the supplies/equipment? yes Do you have any questions related to the use of the equipment or  supplies? No  Functional Questionnaire: (I = Independent and D = Dependent) ADLs: dependent on assistance from her aide. She receives PCS 3 hours /day x 7 days/week.  [name] requesting a BSC  She has as wheelchair and would like a power chair. Reminded  her that [place] explained to her that she does not qualify for a new power chair at this time. She would need a police report addressing the damage to her chair that was in storage in order to support a claim for a new power chair.      Follow up appointments reviewed:  PCP Hospital f/u appt confirmed? Yes  Scheduled to see Dr Margarita Rana on 08/11/2020 @ 1430.-would like a tele visit. [facility] f/u appt confirmed? Yes  Scheduled to see cardiology on 08/25/2020 . She needs to follow up with behavioral health. She said that they have been calling her and she called back today and left a message requesting a return call  Are transportation arrangements needed? No  - she said that her mother will drive her.  If their condition worsens, is the pt aware to call PCP or go to the Emergency Dept.? Yes Was the patient provided with contact information for the PCP's office or ED? Yes Was to pt encouraged to call back with questions or concerns? Yes

## 2020-07-30 ENCOUNTER — Other Ambulatory Visit: Payer: Self-pay

## 2020-07-30 DIAGNOSIS — R296 Repeated falls: Secondary | ICD-10-CM

## 2020-07-30 MED ORDER — MISC. DEVICES MISC: 0 refills | Status: DC

## 2020-07-30 NOTE — Telephone Encounter (Signed)
If BSC means bedside commode Tiffany [name] can you please order? Thanks

## 2020-07-30 NOTE — Telephone Encounter (Signed)
Pt is calling checking on the status of BSC and referral to [facility] for physical therapy

## 2020-07-30 NOTE — Telephone Encounter (Signed)
Order for [facility] will be faxed to [facility] once signed by PCP.

## 2020-08-02 ENCOUNTER — Telehealth: Payer: Self-pay | Admitting: Family Medicine

## 2020-08-02 NOTE — Telephone Encounter (Signed)
Copied from [place] [phone]. Topic: General - Call Back - No Documentation >> Aug 02, 2020 12:40 PM Scherrie Gerlach wrote: Colletta [place] with [facility] calling for office notes to support the bedside commode that was ordered by Dr Margarita Rana for insurance to pay.

## 2020-08-03 ENCOUNTER — Telehealth: Payer: Self-pay

## 2020-08-03 NOTE — Addendum Note (Signed)
Addended byCharlott Rakes on: 08/03/2020 01:18 PM   Modules accepted: Orders

## 2020-08-03 NOTE — Telephone Encounter (Signed)
Referral has been placed to home health for PT

## 2020-08-03 NOTE — Telephone Encounter (Signed)
Referral received for home PT, faxed to [facility] for review.

## 2020-08-03 NOTE — Telephone Encounter (Signed)
Office notes has been faxed over to [facility]

## 2020-08-04 ENCOUNTER — Other Ambulatory Visit: Payer: Self-pay

## 2020-08-04 ENCOUNTER — Ambulatory Visit (INDEPENDENT_AMBULATORY_CARE_PROVIDER_SITE_OTHER): Payer: 59 | Admitting: Psychiatry

## 2020-08-04 DIAGNOSIS — F250 Schizoaffective disorder, bipolar type: Secondary | ICD-10-CM

## 2020-08-04 NOTE — Telephone Encounter (Signed)
Call received from [facility] stating that they are not in network with patient's insurance and are not able to accept the referral.  Call placed to [facility], spoke to [place] who requested referral be faxed for review.  Referral then faxed to # [phone].

## 2020-08-04 NOTE — Progress Notes (Signed)
Virtual Visit via Video Note  I connected with Tiffany [name] on 08/04/20 at  3:30 PM EDT by a video enabled telemedicine application and verified that I am speaking with the correct person using two identifiers. Location: Patient: Patient Home Provider: Home Office   I discussed the limitations of evaluation and management by telemedicine and the availability of in person appointments. The patient expressed understanding and agreed to proceed.   History of Present Illness: Schizoeffective DO Bipolar Type and Chronic PTSD   Treatment Plan Goals: Goal 1: Client would like to create and maintain a more stable living environment in order to better manage mental health care and routine with medication management and therapy participation to reduce anxiety and depression. Goal 2: Client would like to process grief/loss issues and trauma experiences to heal from past to move forward in building healthy relationships with others.   Observations/Objective: Counselor met with Client for individual therapy via telephone. Counselor assessed MH symptoms and progress on treatment plan goals, with patient stating that she is very tired today. Her voice was faint and speech pressured. Client main concern is about medications causing unwanted side effects- such as weight gain, which she states will complicate other health conditions. Client presents with moderate depression and moderate anxiety. Client denied suicidal ideation or self-harm behaviors. Client experiencing active paranoia and complex health issues. Client states she is having hallucinations, however, unable to describe them in detail. Counselor to communicate concerns with providers and encouraged Client to contact nurse line to triage before next appointment with provider.    Goal 1: Client confirmed that she remains in her housing. Since our last session, Client was hospitalized for several days. Client stated she was unable to work on  alternative housing due to health issues. Client reports that her aid and her mother are helping to look into options. Client expressed need to feel safe and a home that is accessible, pet friendly and affordable. Counselor focus on safety aspects encouraging Client to communicate needs and concerns with housing management and her support system to enhance safety and reduce anxiety/paranoia.          Goal 2: Counselor and Client identified trauma triggers experienced since the last session. Client discussed medical trauma and fear of weight gain impacting ability to breathe, mobility and diabetes. Counselor prompted Client to engage in relaxation techniques, taking breaths, initiating relaxation in her body and mindfulness. Client reported being very tired, speaking in repetition and fixated on medication issues. Client was oriented x5.    Assessment and Plan: Patient will continue to keep the office updated on her current contact information.  Patient will continue to follow recommendations of providers and implement skills learned in session.   Follow Up Instructions: Counselor to continue collaboration with providers for coordination of care.    The patient was advised to call back or seek an in-person evaluation if the symptoms worsen or if the condition fails to improve as anticipated.     I provided 47 minutes of non-face-to-face time during this encounter.     Lise Auer, LCSW

## 2020-08-06 ENCOUNTER — Ambulatory Visit: Payer: Self-pay | Admitting: *Deleted

## 2020-08-06 NOTE — Telephone Encounter (Signed)
C/o diarrhea x 1 week especially after eating. Diarrhea x 5 in the past 24 hours. Blood noted in stool, "not a lot" and on "pad". Can tolerated fluids, grape soda, water , pepsi. Patient denies weakness standing. Patent very slow talking and reports she is feeling tired and just woke up. Instructed patient to go to [facility] or ED . Care advise given. Patient verbalized understanding of care advise and to call back or go to [facility] or ED if symptoms worsen. Unsure of patient disposition.

## 2020-08-06 NOTE — Telephone Encounter (Signed)
Tiffany [name], checking in to see if you followed up on these concerns with pt?

## 2020-08-06 NOTE — Telephone Encounter (Signed)
Reason for Disposition  [1] Blood in the stool AND [2] small amount of blood   (Exception: only on toilet paper. Reason: diarrhea can cause rectal irritation with blood on wiping)  Answer Assessment - Initial Assessment Questions 1. DIARRHEA SEVERITY: "How bad is the diarrhea?" "How many more stools have you had in the past 24 hours than normal?"    - NO DIARRHEA (SCALE 0)   - MILD (SCALE 1-3): Few loose or mushy BMs; increase of 1-3 stools over normal daily number of stools; mild increase in ostomy output.   -  MODERATE (SCALE 4-7): Increase of 4-6 stools daily over normal; moderate increase in ostomy output. * SEVERE (SCALE 8-10; OR 'WORST POSSIBLE'): Increase of 7 or more stools daily over normal; moderate increase in ostomy output; incontinence.     moderate 2. ONSET: "When did the diarrhea begin?"      1 week ago  3. BM CONSISTENCY: "How loose or watery is the diarrhea?"      water 4. VOMITING: "Are you also vomiting?" If Yes, ask: "How many times in the past 24 hours?"      No  5. ABDOMINAL PAIN: "Are you having any abdominal pain?" If Yes, ask: "What does it feel like?" (e.g., crampy, dull, intermittent, constant)      No  6. ABDOMINAL PAIN SEVERITY: If present, ask: "How bad is the pain?"  (e.g., Scale 1-10; mild, moderate, or severe)   - MILD (1-3): doesn't interfere with normal activities, abdomen soft and not tender to touch    - MODERATE (4-7): interferes with normal activities or awakens from sleep, abdomen tender to touch    - SEVERE (8-10): excruciating pain, doubled over, unable to do any normal activities       Denies  7. ORAL INTAKE: If vomiting, "Have you been able to drink liquids?" "How much liquids have you had in the past 24 hours?"     na 8. HYDRATION: "Any signs of dehydration?" (e.g., dry mouth [not just dry lips], too weak to stand, dizziness, new weight loss) "When did you last urinate?"     Denies , can drink fluids  9. EXPOSURE: "Have you traveled to a foreign  country recently?" "Have you been exposed to anyone with diarrhea?" "Could you have eaten any food that was spoiled?"     na 10. ANTIBIOTIC USE: "Are you taking antibiotics now or have you taken antibiotics in the past 2 months?"       Na  11. OTHER SYMPTOMS: "Do you have any other symptoms?" (e.g., fever, blood in stool)noted blood in stool       Tired  12. PREGNANCY: "Is there any chance you are pregnant?" "When was your last menstrual period?"       na  Protocols used: [facility]

## 2020-08-08 ENCOUNTER — Other Ambulatory Visit: Payer: Self-pay

## 2020-08-08 ENCOUNTER — Emergency Department (HOSPITAL_COMMUNITY): Payer: 59

## 2020-08-08 ENCOUNTER — Inpatient Hospital Stay (HOSPITAL_COMMUNITY)
Admission: EM | Admit: 2020-08-08 | Discharge: 2020-08-16 | DRG: 682 | Disposition: A | Discharge status: Home / Self Care | Payer: 59 | Attending: Student | Admitting: Student

## 2020-08-08 DIAGNOSIS — R5381 Other malaise: Secondary | ICD-10-CM | POA: Diagnosis present

## 2020-08-08 DIAGNOSIS — D631 Anemia in chronic kidney disease: Secondary | ICD-10-CM | POA: Diagnosis present

## 2020-08-08 DIAGNOSIS — E785 Hyperlipidemia, unspecified: Secondary | ICD-10-CM | POA: Diagnosis present

## 2020-08-08 DIAGNOSIS — Z9189 Other specified personal risk factors, not elsewhere classified: Secondary | ICD-10-CM | POA: Diagnosis not present

## 2020-08-08 DIAGNOSIS — R1900 Intra-abdominal and pelvic swelling, mass and lump, unspecified site: Secondary | ICD-10-CM | POA: Diagnosis present

## 2020-08-08 DIAGNOSIS — F250 Schizoaffective disorder, bipolar type: Secondary | ICD-10-CM | POA: Diagnosis present

## 2020-08-08 DIAGNOSIS — R4182 Altered mental status, unspecified: Secondary | ICD-10-CM | POA: Diagnosis not present

## 2020-08-08 DIAGNOSIS — E871 Hypo-osmolality and hyponatremia: Secondary | ICD-10-CM | POA: Diagnosis present

## 2020-08-08 DIAGNOSIS — I130 Hypertensive heart and chronic kidney disease with heart failure and stage 1 through stage 4 chronic kidney disease, or unspecified chronic kidney disease: Secondary | ICD-10-CM | POA: Diagnosis present

## 2020-08-08 DIAGNOSIS — J9611 Chronic respiratory failure with hypoxia: Secondary | ICD-10-CM | POA: Diagnosis present

## 2020-08-08 DIAGNOSIS — L89312 Pressure ulcer of right buttock, stage 2: Secondary | ICD-10-CM | POA: Diagnosis present

## 2020-08-08 DIAGNOSIS — N390 Urinary tract infection, site not specified: Secondary | ICD-10-CM | POA: Diagnosis present

## 2020-08-08 DIAGNOSIS — E1140 Type 2 diabetes mellitus with diabetic neuropathy, unspecified: Secondary | ICD-10-CM | POA: Diagnosis present

## 2020-08-08 DIAGNOSIS — G4733 Obstructive sleep apnea (adult) (pediatric): Secondary | ICD-10-CM | POA: Diagnosis present

## 2020-08-08 DIAGNOSIS — N949 Unspecified condition associated with female genital organs and menstrual cycle: Secondary | ICD-10-CM | POA: Diagnosis present

## 2020-08-08 DIAGNOSIS — N1832 Chronic kidney disease, stage 3b: Secondary | ICD-10-CM | POA: Diagnosis present

## 2020-08-08 DIAGNOSIS — E278 Other specified disorders of adrenal gland: Secondary | ICD-10-CM | POA: Diagnosis present

## 2020-08-08 DIAGNOSIS — F4310 Post-traumatic stress disorder, unspecified: Secondary | ICD-10-CM | POA: Diagnosis present

## 2020-08-08 DIAGNOSIS — Z91048 Other nonmedicinal substance allergy status: Secondary | ICD-10-CM

## 2020-08-08 DIAGNOSIS — I10 Essential (primary) hypertension: Secondary | ICD-10-CM | POA: Diagnosis present

## 2020-08-08 DIAGNOSIS — Z87820 Personal history of traumatic brain injury: Secondary | ICD-10-CM

## 2020-08-08 DIAGNOSIS — Z8674 Personal history of sudden cardiac arrest: Secondary | ICD-10-CM

## 2020-08-08 DIAGNOSIS — B961 Klebsiella pneumoniae [K. pneumoniae] as the cause of diseases classified elsewhere: Secondary | ICD-10-CM | POA: Diagnosis present

## 2020-08-08 DIAGNOSIS — A084 Viral intestinal infection, unspecified: Secondary | ICD-10-CM | POA: Diagnosis present

## 2020-08-08 DIAGNOSIS — L98429 Non-pressure chronic ulcer of back with unspecified severity: Secondary | ICD-10-CM | POA: Diagnosis not present

## 2020-08-08 DIAGNOSIS — B9620 Unspecified Escherichia coli [E. coli] as the cause of diseases classified elsewhere: Secondary | ICD-10-CM | POA: Diagnosis present

## 2020-08-08 DIAGNOSIS — Z79899 Other long term (current) drug therapy: Secondary | ICD-10-CM

## 2020-08-08 DIAGNOSIS — Z818 Family history of other mental and behavioral disorders: Secondary | ICD-10-CM

## 2020-08-08 DIAGNOSIS — F259 Schizoaffective disorder, unspecified: Secondary | ICD-10-CM | POA: Diagnosis not present

## 2020-08-08 DIAGNOSIS — E872 Acidosis, unspecified: Secondary | ICD-10-CM | POA: Diagnosis present

## 2020-08-08 DIAGNOSIS — D649 Anemia, unspecified: Secondary | ICD-10-CM

## 2020-08-08 DIAGNOSIS — Z95810 Presence of automatic (implantable) cardiac defibrillator: Secondary | ICD-10-CM | POA: Diagnosis not present

## 2020-08-08 DIAGNOSIS — F1421 Cocaine dependence, in remission: Secondary | ICD-10-CM | POA: Diagnosis present

## 2020-08-08 DIAGNOSIS — E1122 Type 2 diabetes mellitus with diabetic chronic kidney disease: Secondary | ICD-10-CM | POA: Diagnosis present

## 2020-08-08 DIAGNOSIS — E119 Type 2 diabetes mellitus without complications: Secondary | ICD-10-CM

## 2020-08-08 DIAGNOSIS — F1911 Other psychoactive substance abuse, in remission: Secondary | ICD-10-CM | POA: Diagnosis present

## 2020-08-08 DIAGNOSIS — R339 Retention of urine, unspecified: Secondary | ICD-10-CM | POA: Diagnosis present

## 2020-08-08 DIAGNOSIS — I959 Hypotension, unspecified: Secondary | ICD-10-CM | POA: Diagnosis present

## 2020-08-08 DIAGNOSIS — Z6820 Body mass index (BMI) 20.0-20.9, adult: Secondary | ICD-10-CM

## 2020-08-08 DIAGNOSIS — Z20822 Contact with and (suspected) exposure to covid-19: Secondary | ICD-10-CM | POA: Diagnosis present

## 2020-08-08 DIAGNOSIS — L89152 Pressure ulcer of sacral region, stage 2: Secondary | ICD-10-CM | POA: Diagnosis present

## 2020-08-08 DIAGNOSIS — Z7951 Long term (current) use of inhaled steroids: Secondary | ICD-10-CM

## 2020-08-08 DIAGNOSIS — N179 Acute kidney failure, unspecified: Principal | ICD-10-CM | POA: Diagnosis present

## 2020-08-08 DIAGNOSIS — E869 Volume depletion, unspecified: Secondary | ICD-10-CM | POA: Diagnosis present

## 2020-08-08 DIAGNOSIS — Z888 Allergy status to other drugs, medicaments and biological substances status: Secondary | ICD-10-CM

## 2020-08-08 DIAGNOSIS — J449 Chronic obstructive pulmonary disease, unspecified: Secondary | ICD-10-CM | POA: Diagnosis not present

## 2020-08-08 DIAGNOSIS — I5032 Chronic diastolic (congestive) heart failure: Secondary | ICD-10-CM | POA: Diagnosis present

## 2020-08-08 DIAGNOSIS — D500 Iron deficiency anemia secondary to blood loss (chronic): Secondary | ICD-10-CM | POA: Diagnosis present

## 2020-08-08 DIAGNOSIS — E43 Unspecified severe protein-calorie malnutrition: Secondary | ICD-10-CM | POA: Diagnosis present

## 2020-08-08 DIAGNOSIS — Z882 Allergy status to sulfonamides status: Secondary | ICD-10-CM

## 2020-08-08 DIAGNOSIS — G894 Chronic pain syndrome: Secondary | ICD-10-CM | POA: Diagnosis present

## 2020-08-08 DIAGNOSIS — Z9981 Dependence on supplemental oxygen: Secondary | ICD-10-CM

## 2020-08-08 DIAGNOSIS — G9341 Metabolic encephalopathy: Secondary | ICD-10-CM | POA: Diagnosis present

## 2020-08-08 DIAGNOSIS — G931 Anoxic brain damage, not elsewhere classified: Secondary | ICD-10-CM | POA: Diagnosis present

## 2020-08-08 DIAGNOSIS — Z885 Allergy status to narcotic agent status: Secondary | ICD-10-CM

## 2020-08-08 DIAGNOSIS — Z8249 Family history of ischemic heart disease and other diseases of the circulatory system: Secondary | ICD-10-CM

## 2020-08-08 DIAGNOSIS — J439 Emphysema, unspecified: Secondary | ICD-10-CM | POA: Diagnosis present

## 2020-08-08 DIAGNOSIS — M109 Gout, unspecified: Secondary | ICD-10-CM | POA: Diagnosis present

## 2020-08-08 DIAGNOSIS — K219 Gastro-esophageal reflux disease without esophagitis: Secondary | ICD-10-CM | POA: Diagnosis present

## 2020-08-08 DIAGNOSIS — N189 Chronic kidney disease, unspecified: Secondary | ICD-10-CM | POA: Diagnosis present

## 2020-08-08 DIAGNOSIS — R64 Cachexia: Secondary | ICD-10-CM | POA: Diagnosis present

## 2020-08-08 DIAGNOSIS — Z87891 Personal history of nicotine dependence: Secondary | ICD-10-CM

## 2020-08-08 DIAGNOSIS — E876 Hypokalemia: Secondary | ICD-10-CM | POA: Diagnosis present

## 2020-08-08 DIAGNOSIS — Z8673 Personal history of transient ischemic attack (TIA), and cerebral infarction without residual deficits: Secondary | ICD-10-CM

## 2020-08-08 LAB — CBC WITH DIFFERENTIAL/PLATELET
Abs Immature Granulocytes: 0.03 10*3/uL (ref 0.00–0.07)
Basophils Absolute: 0 10*3/uL (ref 0.0–0.1)
Basophils Relative: 1 %
Eosinophils Absolute: 0 10*3/uL (ref 0.0–0.5)
Eosinophils Relative: 1 %
HCT: 24.2 % — ABNORMAL LOW (ref 36.0–46.0)
Hemoglobin: 7.4 g/dL — ABNORMAL LOW (ref 12.0–15.0)
Immature Granulocytes: 1 %
Lymphocytes Relative: 33 %
Lymphs Abs: 1 10*3/uL (ref 0.7–4.0)
MCH: 29 pg (ref 26.0–34.0)
MCHC: 30.6 g/dL (ref 30.0–36.0)
MCV: 94.9 fL (ref 80.0–100.0)
Monocytes Absolute: 0.3 10*3/uL (ref 0.1–1.0)
Monocytes Relative: 11 %
Neutro Abs: 1.5 10*3/uL — ABNORMAL LOW (ref 1.7–7.7)
Neutrophils Relative %: 53 %
Platelets: 308 10*3/uL (ref 150–400)
RBC: 2.55 MIL/uL — ABNORMAL LOW (ref 3.87–5.11)
RDW: 15.9 % — ABNORMAL HIGH (ref 11.5–15.5)
WBC: 2.9 10*3/uL — ABNORMAL LOW (ref 4.0–10.5)
nRBC: 0 % (ref 0.0–0.2)

## 2020-08-08 LAB — AMMONIA: Ammonia: 15 umol/L (ref 9–35)

## 2020-08-08 LAB — LACTIC ACID, PLASMA
Lactic Acid, Venous: 1.1 mmol/L (ref 0.5–1.9)
Lactic Acid, Venous: 1 mmol/L (ref 0.5–1.9)

## 2020-08-08 LAB — RESP PANEL BY RT-PCR (FLU A&B, COVID) ARPGX2
Influenza A by PCR: NEGATIVE
Influenza B by PCR: NEGATIVE
SARS Coronavirus 2 by RT PCR: NEGATIVE

## 2020-08-08 LAB — URINALYSIS, ROUTINE W REFLEX MICROSCOPIC
Bilirubin Urine: NEGATIVE
Glucose, UA: NEGATIVE mg/dL
Ketones, ur: NEGATIVE mg/dL
Nitrite: NEGATIVE
Protein, ur: 30 mg/dL — AB
Specific Gravity, Urine: 1.015 (ref 1.005–1.030)
pH: 5 (ref 5.0–8.0)

## 2020-08-08 LAB — BLOOD GAS, VENOUS
Acid-base deficit: 11.4 mmol/L — ABNORMAL HIGH (ref 0.0–2.0)
Bicarbonate: 15 mmol/L — ABNORMAL LOW (ref 20.0–28.0)
O2 Saturation: 28.1 %
Patient temperature: 37
pCO2, Ven: 38.1 mmHg — ABNORMAL LOW (ref 44.0–60.0)
pH, Ven: 7.219 — ABNORMAL LOW (ref 7.250–7.430)
pO2, Ven: <31 mmHg — CL (ref 32.0–45.0)

## 2020-08-08 LAB — COMPREHENSIVE METABOLIC PANEL
ALT: 8 U/L (ref 0–44)
AST: 13 U/L — ABNORMAL LOW (ref 15–41)
Albumin: 2.7 g/dL — ABNORMAL LOW (ref 3.5–5.0)
Alkaline Phosphatase: 118 U/L (ref 38–126)
Anion gap: 7 (ref 5–15)
BUN: 40 mg/dL — ABNORMAL HIGH (ref 8–23)
CO2: 14 mmol/L — ABNORMAL LOW (ref 22–32)
Calcium: 7.9 mg/dL — ABNORMAL LOW (ref 8.9–10.3)
Chloride: 115 mmol/L — ABNORMAL HIGH (ref 98–111)
Creatinine, Ser: 3.83 mg/dL — ABNORMAL HIGH (ref 0.44–1.00)
GFR, Estimated: 13 mL/min — ABNORMAL LOW (ref >60)
Glucose, Bld: 82 mg/dL (ref 70–99)
Potassium: 3.6 mmol/L (ref 3.5–5.1)
Sodium: 136 mmol/L (ref 135–145)
Total Bilirubin: 0.4 mg/dL (ref 0.3–1.2)
Total Protein: 5 g/dL — ABNORMAL LOW (ref 6.5–8.1)

## 2020-08-08 LAB — RAPID URINE DRUG SCREEN, HOSP PERFORMED
Amphetamines: NOT DETECTED
Barbiturates: NOT DETECTED
Benzodiazepines: POSITIVE — AB
Cocaine: NOT DETECTED
Opiates: NOT DETECTED
Tetrahydrocannabinol: NOT DETECTED

## 2020-08-08 LAB — TYPE AND SCREEN
ABO/RH(D): O POS
Antibody Screen: NEGATIVE

## 2020-08-08 LAB — ETHANOL: Alcohol, Ethyl (B): <10 mg/dL (ref <10)

## 2020-08-08 LAB — PROTIME-INR
INR: 1.4 — ABNORMAL HIGH (ref 0.8–1.2)
Prothrombin Time: 17.3 seconds — ABNORMAL HIGH (ref 11.4–15.2)

## 2020-08-08 LAB — APTT: aPTT: 34 seconds (ref 24–36)

## 2020-08-08 LAB — HEMOGLOBIN AND HEMATOCRIT, BLOOD
HCT: 27 % — ABNORMAL LOW (ref 36.0–46.0)
Hemoglobin: 8.2 g/dL — ABNORMAL LOW (ref 12.0–15.0)

## 2020-08-08 LAB — CK: Total CK: 81 U/L (ref 38–234)

## 2020-08-08 LAB — POC OCCULT BLOOD, ED: Fecal Occult Bld: NEGATIVE

## 2020-08-08 LAB — SALICYLATE LEVEL: Salicylate Lvl: <7 mg/dL — ABNORMAL LOW (ref 7.0–30.0)

## 2020-08-08 LAB — ACETAMINOPHEN LEVEL: Acetaminophen (Tylenol), Serum: <10 ug/mL — ABNORMAL LOW (ref 10–30)

## 2020-08-08 LAB — TSH: TSH: 0.765 u[IU]/mL (ref 0.350–4.500)

## 2020-08-08 LAB — MAGNESIUM: Magnesium: 1.3 mg/dL — ABNORMAL LOW (ref 1.7–2.4)

## 2020-08-08 MED ORDER — MAGNESIUM SULFATE 2 GM/50ML IV SOLN
2.0000 g | Freq: Once | INTRAVENOUS | Status: AC
  Administered 2020-08-08: 2 g via INTRAVENOUS
  Filled 2020-08-08: qty 50

## 2020-08-08 MED ORDER — ONDANSETRON HCL 4 MG PO TABS
4.0000 mg | ORAL_TABLET | Freq: Four times a day (QID) | ORAL | Status: DC | PRN
  Administered 2020-08-10 – 2020-08-11 (×2): 4 mg via ORAL
  Filled 2020-08-08 (×2): qty 1

## 2020-08-08 MED ORDER — ONDANSETRON HCL 4 MG/2ML IJ SOLN: 4.0000 mg | Freq: Four times a day (QID) | INTRAMUSCULAR | Status: DC | PRN

## 2020-08-08 MED ORDER — SODIUM CHLORIDE 0.9 % IV BOLUS
1000.0000 mL | Freq: Once | INTRAVENOUS | Status: AC
  Administered 2020-08-08: 1000 mL via INTRAVENOUS

## 2020-08-08 MED ORDER — ACETAMINOPHEN 325 MG PO TABS: 650.0000 mg | ORAL_TABLET | Freq: Four times a day (QID) | ORAL | Status: DC | PRN

## 2020-08-08 MED ORDER — ACETAMINOPHEN 650 MG RE SUPP: 650.0000 mg | Freq: Four times a day (QID) | RECTAL | Status: DC | PRN

## 2020-08-08 MED ORDER — STERILE WATER FOR INJECTION IV SOLN
INTRAVENOUS | Status: DC
  Filled 2020-08-08: qty 1000
  Filled 2020-08-08 (×2): qty 150
  Filled 2020-08-08: qty 1000
  Filled 2020-08-08: qty 150
  Filled 2020-08-08 (×3): qty 1000
  Filled 2020-08-08 (×2): qty 150

## 2020-08-08 MED ORDER — LACTATED RINGERS IV SOLN: INTRAVENOUS | Status: DC

## 2020-08-08 NOTE — ED Notes (Signed)
Below order not completed by EW. 

## 2020-08-08 NOTE — ED Notes (Signed)
Critical blood gas  p02 less than 31. Venous.Marland KitchenMarland KitchenMarland Kitchen

## 2020-08-08 NOTE — ED Provider Notes (Signed)
[facility] DEPT Provider Note   CSN: [id number] Arrival date & time: 08/08/20  1441     History Chief Complaint  Patient presents with   Hypotension    Tiffany [name] is a 60 y.o. female with history of asthma, COPD, chronic respiratory failure, OSA, diastolic CHF, HTN, HLD, DM2, CVA, cardiac arrest 2018, CKD stage III, anemia of chronic disease, chronic pain syndrome, GERD, schizoaffective disorder, PTSD with recent admission for acute metabolic encephalopathy and electrolyte derangements that presents emerged department today for hypotension and altered mental status.  Patient is level 5 caveat due to altered mental status.  Patient will respond to some questions and follow some commands, however nonconversant. Unable to contact family at this time.  HPI     Past Medical History:  Diagnosis Date   Agitation 11/22/2017   Anoxic brain injury ([place]) 09/08/2016   C. Arrest due to respiratory failure and COPD exacerbation   Anxiety    Arthritis    "all over" (04/10/2016)   Asthma 10/18/2010   Binge eating disorder    Cardiac arrest ([place]) 09/08/2016   PEA   Carotid artery stenosis    1-39% bilateral by dopplers 11/2016   Chronic diastolic (congestive) heart failure ([place])    Chronic kidney disease    "I see a kidney dr." (04/10/2016)   Chronic pain syndrome 06/18/2012   Chronic post-traumatic stress disorder (PTSD) 05/27/2018   Chronic respiratory failure with hypoxia and hypercapnia ([place]) 06/22/2015   TRILOGY Vent >AVAPA-ES., Vt target 200-400, Max P 30 , PS max 20 , PS min 6-10 , E Max 6, E Min 4, Rate Auto AVAPS Rate 2 (titrate for pt comfort) , bleed O2 at 5l/m continuous flow .    CKD (chronic kidney disease) stage 3, GFR 30-59 ml/min (HCC) 12/15/2016   Closed displaced fracture of fifth metacarpal bone 03/21/2018   Cocaine use disorder, severe, in sustained remission ([place]) 63/08/7562   Complication of anesthesia    decreased bp, decreased heart rate    COPD (chronic obstructive pulmonary disease) ([place]) 07/08/2014   Depression    Diabetic neuropathy ([place]) 04/24/2011   Difficulty with speech 01/24/2018   Disorder of nervous system    Drug abuse ([place]) 11/21/2017   Dyslipidemia 04/24/2011   Elevated troponin 04/28/2012   Emphysema    Encephalopathy 11/21/2017   Essential hypertension 03/22/2016   Fibula fracture 07/10/2016   Frequent falls 10/11/2017   GERD (gastroesophageal reflux disease)    Gout 04/11/2017   Heart attack ([place]) 1980s   History of blood transfusion 1994   "couldn't stop bleeding from my period"   History of drug abuse in remission ([place]) 11/28/2015   Quit in 2017   Hyperlipidemia LDL goal <70    Incontinence    Manic depression ([place])    Morbid obesity ([place]) 10/18/2010   Obstructive sleep apnea 10/18/2010   On home oxygen therapy    "6L; 24/7" (04/10/2016)   OSA on CPAP    "wear mask sometimes" (04/10/2016)   Paranoid ([place])    "sometimes; I'm on RX for it" (04/10/2016)   Prolonged Q-T interval on ECG    Rectal bleeding 12/31/2015   Right carotid bruit 11/09/2016   Schizoaffective disorder, bipolar type ([place]) 04/05/2018   Seasonal allergies    Seborrheic keratoses 12/31/2013   Seizures ([place])    "don't know what kind; last one was ~ 1 yr ago" (04/10/2016)   Stroke [facility] 1980s   denies residual on 04/10/2016   Thrush  09/19/2013   Type 2 diabetes mellitus ([place]) 10/18/2010    Patient Active Problem List   Diagnosis Date Noted   Protein-calorie malnutrition, severe 05/15/2020   Malnutrition of moderate degree 32/99/2426   Periumbilical abdominal pain    Dysphagia    Nausea vomiting and diarrhea    Generalized abdominal pain    E. coli UTI 83/41/9622   Acute metabolic encephalopathy 29/79/8921   Psychosis ([place]) 02/20/2020   Schizophrenia, schizo-affective ([place]) 19/41/7408   Metabolic acidosis, normal anion gap (NAG) 02/20/2020   Altered mental status    Hypocalcemia    AKI (acute kidney injury) ([place]) 02/18/2020   Pelvic mass  in female 01/02/2020   Genital herpes 11/25/2019   Stage 2 skin ulcer of sacral region ([place]) 10/28/2019   ARF (acute renal failure) ([place]) 10/27/2019   Family discord 02/04/2019   Aggressive behavior    PTSD (post-traumatic stress disorder) 05/27/2018   Schizoaffective disorder, bipolar type ([place]) 04/05/2018   Closed displaced fracture of fifth metacarpal bone 03/21/2018   Difficulty with speech 01/24/2018   Encephalopathy 11/21/2017   Drug abuse ([place]) 11/21/2017   Frequent falls 10/11/2017   Binge eating disorder    Dependence on continuous supplemental oxygen 05/14/2017   Gout 04/11/2017   Hypomagnesemia    CKD (chronic kidney disease) stage 3, GFR 30-59 ml/min (HCC) 12/15/2016   Carotid artery stenosis    Hypokalemia    Osteoarthritis 10/26/2016   Anoxic brain injury ([place]) 09/08/2016   Overactive bladder 06/07/2016   QT prolongation    OSA and COPD overlap syndrome ([place])    Arthritis    Essential hypertension 03/22/2016   Cocaine use disorder, severe, in sustained remission ([place]) 12/17/2015   History of drug abuse in remission ([place]) 11/28/2015   Hyponatremia 11/25/2015   Chronic diastolic congestive heart failure (HCC)    Chronic respiratory failure with hypoxia ([place]) 06/22/2015   Tobacco use disorder 07/22/2014   COPD (chronic obstructive pulmonary disease) ([place]) 07/08/2014   Seizure ([place]) 01/04/2013   Chronic pain syndrome 06/18/2012   Dyslipidemia 04/24/2011   Anemia 04/24/2011   Diabetic neuropathy ([place]) 04/24/2011   Morbid obesity ([place]) 10/18/2010   Type 2 diabetes mellitus ([place]) 10/18/2010    Past Surgical History:  Procedure Laterality Date   CESAREAN SECTION  1997   HERNIA REPAIR     IR CHOLANGIOGRAM EXISTING TUBE  07/20/2016   IR PERC CHOLECYSTOSTOMY  05/10/2016   IR RADIOLOGIST EVAL & MGMT  06/08/2016   IR RADIOLOGIST EVAL & MGMT  06/29/2016   IR SINUS/FIST TUBE CHK-NON GI  07/12/2016   RIGHT/LEFT HEART CATH AND CORONARY ANGIOGRAPHY N/A 06/19/2017   Procedure:  RIGHT/LEFT HEART CATH AND CORONARY ANGIOGRAPHY;  Surgeon: Jolaine Artist, MD;  Location: [facility];  Service: Cardiovascular;  Laterality: N/A;   TIBIA IM NAIL INSERTION Right 07/12/2016   Procedure: INTRAMEDULLARY (IM) NAIL RIGHT TIBIA;  Surgeon: Leandrew Koyanagi, MD;  Location: [facility];  Service: Orthopedics;  Laterality: Right;   UMBILICAL HERNIA REPAIR  ~ 1963   "that's why I don't have a belly button"   VAGINAL HYSTERECTOMY       OB History     Gravida  4   Para  4   Term  3   Preterm  1   AB  0   Living  3      SAB  0   IAB  0   Ectopic  0   Multiple  0   Live Births  3           Family History  Problem Relation Age of Onset   Cancer Father        prostate   Cancer Mother        lung   Depression Mother    Depression Sister    Anxiety disorder Sister    Schizophrenia Sister    Bipolar disorder Sister    Depression Sister    Depression Brother    Heart failure Other        cousin    Social History   Tobacco Use   Smoking status: Former    Packs/day: 1.50    Years: 38.00    Pack years: 57.00    Types: Cigarettes    Start date: 03/13/1977    Quit date: 04/10/2016    Years since quitting: 4.3   Smokeless tobacco: Never  Vaping Use   Vaping Use: Never used  Substance Use Topics   Alcohol use: No    Alcohol/week: 0.0 standard drinks   Drug use: No    Types: Cocaine    Comment: 04/10/2016 "last used cocaine back in November 2017"    Home Medications Prior to Admission medications   Medication Sig Start Date End Date Taking? Authorizing Provider  albuterol (PROVENTIL) (2.5 MG/3ML) 0.083% nebulizer solution Take 3 mLs (2.5 mg total) by nebulization every 6 (six) hours as needed for wheezing or shortness of breath. 03/17/20   Collene Gobble, MD  albuterol (VENTOLIN HFA) 108 (90 Base) MCG/ACT inhaler Inhale 2 puffs into the lungs every 6 (six) hours as needed for wheezing or shortness of breath. 03/17/20   Collene Gobble, MD  amLODipine  (NORVASC) 5 MG tablet Take 1 tablet (5 mg total) by mouth daily. 07/29/20 08/28/20  Darliss Cheney, MD  asenapine (SAPHRIS) 5 MG SUBL 24 hr tablet Place 2 tablets (10 mg total) under the tongue 2 (two) times daily. 05/31/20   Cobos, Myer Peer, MD  colchicine 0.6 MG tablet Take 0.6 mg by mouth 2 (two) times daily. 05/22/20   [provider]  diazepam (VALIUM) 10 MG tablet Take 1 tablet (10 mg total) by mouth 3 (three) times daily as needed for anxiety. 05/31/20 08/29/20  Cobos, Myer Peer, MD  diclofenac Sodium (VOLTAREN) 1 % GEL Apply 4 g topically 4 (four) times daily. 05/21/20   Ladell Pier, MD  escitalopram (LEXAPRO) 10 MG tablet Take 1 tablet (10 mg total) by mouth daily. 05/20/20 05/20/21  Cobos, Myer Peer, MD  feeding supplement (ENSURE ENLIVE / ENSURE PLUS) LIQD Take 237 mLs by mouth 2 (two) times daily between meals. 02/26/20   Hongalgi, Lenis Dickinson, MD  Fluticasone-Umeclidin-Vilant (TRELEGY ELLIPTA) 100-62.5-25 MCG/INH AEPB Inhale 1 puff into the lungs daily. 03/17/20   Collene Gobble, MD  furosemide (LASIX) 80 MG tablet Take 1 tablet (80 mg total) by mouth daily. 06/07/20   [name], MD  gabapentin (NEURONTIN) 600 MG tablet Take 0.5 tablets (300 mg total) by mouth 2 (two) times daily. 05/16/20   Nita Sells, MD  lidocaine (LIDODERM) 5 % Place 1 patch onto the skin daily. Remove & Discard patch within 12 hours or as directed by MD 06/23/20   [name], MD  lisdexamfetamine (VYVANSE) 70 MG capsule Take 70 mg by mouth daily.    [provider]  Misc. Devices [place] Power wheelchair.  Diagnosis-frequent falls, bilateral knee osteoarthritis 05/31/20   [name], MD  Misc. Devices MISC Bedside Commode DX: frequent  fall 07/30/20   [name], MD  montelukast (SINGULAIR) 10 MG tablet TAKE 1 TABLET(10 MG) BY MOUTH AT BEDTIME 07/27/20   [name], MD  omeprazole (PRILOSEC) 40 MG capsule TAKE 1 CAPSULE(40 MG) BY MOUTH DAILY 07/27/20   [name], MD  oxybutynin  (DITROPAN) 5 MG tablet TAKE 1 TABLET(5 MG) BY MOUTH TWICE DAILY 05/26/20   [name], MD  potassium chloride SA (KLOR-CON) 20 MEQ tablet Take 2 tablets (40 mEq total) by mouth 2 (two) times daily. 07/14/20   [name], MD  prazosin (MINIPRESS) 2 MG capsule Take 2 mg by mouth at bedtime. 07/12/20   [provider]  prazosin (MINIPRESS) 5 MG capsule Take 5 mg by mouth 2 (two) times daily.    [provider]  thiamine 100 MG tablet Take 1 tablet (100 mg total) by mouth daily. 02/27/20   Hongalgi, Lenis Dickinson, MD  tiZANidine (ZANAFLEX) 4 MG tablet Take 1 tablet (4 mg total) by mouth every 8 (eight) hours as needed for muscle spasms. 05/25/20   [name], MD  topiramate (TOPAMAX) 25 MG tablet Take 1 tablet (25 mg total) by mouth 2 (two) times daily. 06/21/20 06/21/21  Cobos, Myer Peer, MD  VYVANSE 20 MG capsule Take 20 mg by mouth daily as needed. 06/20/20   [provider]    Allergies    Hydrocodone, Hydroxyzine, Latuda [lurasidone hcl], Magnesium-containing compounds, Prednisone, Tramadol, Codeine, Sulfa antibiotics, and Tape  Review of Systems   Review of Systems  Physical Exam Updated Vital Signs BP (!) 167/110   Pulse 62   Temp (!) 97.4 F (36.3 C)   Resp 12   SpO2 100%   Physical Exam  ED Results / Procedures / Treatments   Labs (all labs ordered are listed, but only abnormal results are displayed) Labs Reviewed  COMPREHENSIVE METABOLIC PANEL - Abnormal; Notable for the following components:      Result Value   Chloride 115 (*)    CO2 14 (*)    BUN 40 (*)    Creatinine, Ser 3.83 (*)    Calcium 7.9 (*)    Total Protein 5.0 (*)    Albumin 2.7 (*)    AST 13 (*)    GFR, Estimated 13 (*)    All other components within normal limits  CBC WITH DIFFERENTIAL/PLATELET - Abnormal; Notable for the following components:   WBC 2.9 (*)    RBC 2.55 (*)    Hemoglobin 7.4 (*)    HCT 24.2 (*)    RDW 15.9 (*)    Neutro Abs 1.5 (*)    All other components  within normal limits  PROTIME-INR - Abnormal; Notable for the following components:   Prothrombin Time 17.3 (*)    INR 1.4 (*)    All other components within normal limits  URINALYSIS, ROUTINE W REFLEX MICROSCOPIC - Abnormal; Notable for the following components:   APPearance HAZY (*)    Hgb urine dipstick MODERATE (*)    Protein, ur 30 (*)    Leukocytes,Ua TRACE (*)    Bacteria, UA RARE (*)    All other components within normal limits  RAPID URINE DRUG SCREEN, HOSP PERFORMED - Abnormal; Notable for the following components:   Benzodiazepines POSITIVE (*)    All other components within normal limits  BLOOD GAS, VENOUS - Abnormal; Notable for the following components:   pH, Ven 7.219 (*)    pCO2, Ven 38.1 (*)    pO2, Ven <31.0 (*)  Bicarbonate 15.0 (*)    Acid-base deficit 11.4 (*)    All other components within normal limits  MAGNESIUM - Abnormal; Notable for the following components:   Magnesium 1.3 (*)    All other components within normal limits  SALICYLATE LEVEL - Abnormal; Notable for the following components:   Salicylate Lvl <5.6 (*)    All other components within normal limits  ACETAMINOPHEN LEVEL - Abnormal; Notable for the following components:   Acetaminophen (Tylenol), Serum <10 (*)    All other components within normal limits  HEMOGLOBIN AND HEMATOCRIT, BLOOD - Abnormal; Notable for the following components:   Hemoglobin 8.2 (*)    HCT 27.0 (*)    All other components within normal limits  RESP PANEL BY RT-PCR (FLU A&B, COVID) ARPGX2  CULTURE, BLOOD (ROUTINE X 2)  CULTURE, BLOOD (ROUTINE X 2)  C DIFFICILE QUICK SCREEN W PCR REFLEX    LACTIC ACID, PLASMA  LACTIC ACID, PLASMA  APTT  CK  AMMONIA  TSH  ETHANOL  CBC  COMPREHENSIVE METABOLIC PANEL  POC OCCULT BLOOD, ED  TYPE AND SCREEN    EKG EKG Interpretation  Date/Time:  Sunday August 08 2020 15:02:21 EDT Ventricular Rate:  61 PR Interval:  162 QRS Duration: 83 QT Interval:  456 QTC  Calculation: 460 R Axis:   59 Text Interpretation: Sinus rhythm Nonspecific T abnormalities, lateral leads No significant change since last tracing Confirmed by Blanchie Dessert [phone]) on 08/08/2020 5:20:51 PM  Radiology CT ABDOMEN PELVIS WO CONTRAST  Result Date: 08/08/2020 CLINICAL DATA:  Flank pain, kidney stone suspected new AKI, AMS EXAM: CT ABDOMEN AND PELVIS WITHOUT CONTRAST TECHNIQUE: Multidetector CT imaging of the abdomen and pelvis was performed following the standard protocol without IV contrast. COMPARISON:  July 23, 2020. FINDINGS: Evaluation is limited by lack of IV contrast. Lower chest: No acute abnormality. Hepatobiliary: Cholelithiasis within the mildly distended gallbladder, similar in comparison to prior. No pericholecystic fat stranding. Unremarkable noncontrast appearance of the liver. Pancreas: No peripancreatic fat stranding. Spleen: Unremarkable. Adrenals/Urinary Tract: Unchanged low-density LEFT adrenal nodule, likely a adrenal adenoma. RIGHT adrenal gland is unremarkable. No hydronephrosis. No nephrolithiasis. Bladder is decompressed, limiting evaluation. Stomach/Bowel: Colon is distended with fluid. Appendiceal candidate is normal; there is no ancillary evidence of appendicitis. No evidence of bowel obstruction. No pneumatosis or portal venous gas. Vascular/Lymphatic: Minimal atherosclerotic calcifications of the aorta. No suspicious lymphadenopathy visualized. Reproductive: Unchanged 8.4 cm LEFT adnexal cyst. Status post hysterectomy. Other: No free air.  Status post periumbilical hernia repair. Musculoskeletal: Remote LEFT-sided rib fractures. No acute osseous abnormality. IMPRESSION: 1. The colon is nondilated but is distended with fluid. This could reflect a nonspecific colitis. No definitive noncontrast evidence of bowel ischemia. 2. Stable appearance of a LEFT adnexal cyst for which a follow-up ultrasound in 6-12 months is recommended. 3. Similar appearance of cholelithiasis  without evidence of cholecystitis. Electronically Signed   By: Valentino Saxon MD   On: 08/08/2020 18:51   CT Head Wo Contrast  Result Date: 08/08/2020 CLINICAL DATA:  BIB EMS from home, family reports that patient has been weak and lethargic since hospital discharge, over 5 days they have noticed bright red blood in stool. W/ EMS pt had pressure of 100/50 and then 90/40, then 80/40, pt refused EMS staring an IV. When EMS arrived pt had her BP pills in her hand, told EMS she would eat them, EMS removed her meds. Hx of psych issues. EXAM: CT HEAD WITHOUT CONTRAST TECHNIQUE: Contiguous axial images were obtained  from the base of the skull through the vertex without intravenous contrast. COMPARISON:  07/22/2020 FINDINGS: Brain: No evidence of acute infarction, hemorrhage, hydrocephalus, extra-axial collection or mass lesion/mass effect. Patchy periventricular white matter hypoattenuation is noted consistent with mild chronic microvascular ischemic change, stable. Vascular: No hyperdense vessel or unexpected calcification. Skull: Normal. Negative for fracture or focal lesion. Sinuses/Orbits: Globes and orbits are unremarkable. Visualized sinuses are clear. Other: None. IMPRESSION: 1. No acute intracranial abnormalities. No change from the prior study. Electronically Signed   By: Lajean Manes M.D.   On: 08/08/2020 15:43   DG Chest Port 1 View  Result Date: 08/08/2020 CLINICAL DATA:  Sepsis. EXAM: PORTABLE CHEST 1 VIEW COMPARISON:  February 18, 2020. FINDINGS: Stable cardiomediastinal silhouette. Both lungs are clear. The visualized skeletal structures are unremarkable. IMPRESSION: No active disease. Electronically Signed   By: Marijo Conception M.D.   On: 08/08/2020 15:49    Procedures .Critical Care  Date/Time: 08/08/2020 6:57 PM Performed by: Alfredia Client, PA-C Authorized by: Alfredia Client, PA-C   Critical care provider statement:    Critical care time (minutes):  45   Critical care was time spent  personally by me on the following activities:  Discussions with consultants, evaluation of patient's response to treatment, examination of patient, ordering and performing treatments and interventions, ordering and review of laboratory studies, ordering and review of radiographic studies, pulse oximetry, re-evaluation of patient's condition, obtaining history from patient or surrogate and review of old charts   Medications Ordered in ED Medications  sodium bicarbonate 150 mEq in sterile water 1,150 mL infusion ( Intravenous New Bag/Given 08/08/20 2208)  acetaminophen (TYLENOL) tablet 650 mg (has no administration in time range)    Or  acetaminophen (TYLENOL) suppository 650 mg (has no administration in time range)  ondansetron (ZOFRAN) tablet 4 mg (has no administration in time range)    Or  ondansetron (ZOFRAN) injection 4 mg (has no administration in time range)  magnesium sulfate IVPB 2 g 50 mL (2 g Intravenous New Bag/Given 08/08/20 2213)  sodium chloride 0.9 % bolus 1,000 mL (1,000 mLs Intravenous New Bag/Given 08/08/20 1530)    ED Course  I have reviewed the triage vital signs and the nursing notes.  Pertinent labs & imaging results that were available during my care of the patient were reviewed by me and considered in my medical decision making (see chart for details).    MDM Rules/Calculators/A&P                           60 year old female that presents to the emerge department today for hypotension and altered mental status with multiple comorbidities.  Digital rectal exam does not show any gross blood or melena.  Patient will respond to some verbal cues and follow some commands, however mostly nonconversant.  Initially blood pressure 62/30, after 2 pressure bag liter boluses, blood pressure elevated to 409 systolic.  Will obtain basic labs for altered mental status at this time and obtain CT head imaging.  Sepsis work-up initiated as well, temperature 97.9.  There is a question from  EMS that patient took blood pressure medications, patient is not on a beta-blocker.   Blood pressure has over the course in the ED, blood pressure has been getting better, latest blood pressure 148/82.  Work-up today shows pH of 7.2, unsure why patient is acidotic.  This would be a metabolic acidosis.  Hemoglobin is slightly lower than normal, 7.4 today.  Digital rectal does not show any blood, Hemoccult negative.  CMP does show electrolyte derangements including creatinine of 3.83 with BUN of 40, this is new from patient's baseline.  Urinalysis does show some blood, CT abdomen pelvis without any obstruction.  Rapid drug screen does show benzos, question benzo overdose in this patient in addition to AKI. CK normal.  Mentation has very minimally improved, patient to be admitted at this time still unable to patient's family.  Patient admitted to Dr. Alcario Drought. The patient appears reasonably stabilized for admission considering the current resources, flow, and capabilities available in the ED at this time, and I doubt any other [facility] requiring further screening and/or treatment in the ED prior to admission.  I discussed this case with my attending physician who cosigned this note including patient's presenting symptoms, physical exam, and planned diagnostics and interventions. Attending physician stated agreement with plan or made changes to plan which were implemented.   Attending physician assessed patient at bedside.   Final Clinical Impression(s) / ED Diagnoses Final diagnoses:  Hypotension, unspecified hypotension type  AKI (acute kidney injury) ([place])  Altered mental status, unspecified altered mental status type    Rx / DC Orders ED Discharge Orders     None        Alfredia Client, PA-C 08/08/20 2242    Blanchie Dessert, MD 08/08/20 [phone]

## 2020-08-08 NOTE — H&P (Addendum)
History and Physical    Tiffany [name] TJQ:[id number] DOB: Jul 14, 1960 DOA: 08/08/2020  PCP: [name], MD  Patient coming from: Home  I have personally briefly reviewed patient's old medical records in [place]  Chief Complaint: Hypotension, AMS  HPI: Tiffany [name] is a 60 y.o. female with medical history significant of COPD on home O2, dCHF, HTN, CKD 3, anemia, prior substance abuse, cardiac arrest in 2018, anoxic brain injury, schizoaffective disorder.  Pt with recent admit earlier this month for N/V/D, metabolic derangements, AMS.  With rehydration and repletion of electrolytes her AMS and AKI resolved.  C.diff and GI pathogen pnl that admit were neg.  It was recommended that she go to SNF but pt declined SNF.  Today EMS called out to house for pt reportedly being weak and lethargic since hospital discharge.  Report by family to EMS of BRB in stool for past 5 days.  With EMS pt with low BPs: 100/50 and then 90/40, then 80/40, pt refused EMS staring an IV. When EMS arrived pt had her BP pills in her hand, told EMS she would eat them, EMS removed her meds.   ED Course: In the ED pt with SBP in 60s initially and AMS.  2L IVF bolus improved BPs to 762U systolic.  UDS positive for benzos (pt takes valium), ? Benzo OD.  Pt with AKI: creat 3.8 and BUN 40 up from 1.2 and 13 on DC earlier this month.  Pt with NAG metabolic acidosis: bicarb 14, Cl 115.  Mg 1.3  WBC 2.9k but looks like pt always has mild leukopenia.  HGB 7.4 down from 8.4 on discharge 7/13  CXR neg, UA neg, COVID and flu neg.  Pt having diarrhea and CT A/P significant for diarrheal illness.  Interestingly however despite report of BRBPR and HGB drop: hemoccult is negative today.  Mentation has improved in ED: pt now states she is having diarrhea, also complains of painful "knots" on her R leg.  Pt does clearly have some intellectual slowing, presumably related to anoxic brain injury from prior  cardiac arrest?   Review of Systems: As per HPI, otherwise all review of systems negative.  Past Medical History:  Diagnosis Date   Agitation 11/22/2017   Anoxic brain injury ([place]) 09/08/2016   C. Arrest due to respiratory failure and COPD exacerbation   Anxiety    Arthritis    "all over" (04/10/2016)   Asthma 10/18/2010   Binge eating disorder    Cardiac arrest ([place]) 09/08/2016   PEA   Carotid artery stenosis    1-39% bilateral by dopplers 11/2016   Chronic diastolic (congestive) heart failure ([place])    Chronic kidney disease    "I see a kidney dr." (04/10/2016)   Chronic pain syndrome 06/18/2012   Chronic post-traumatic stress disorder (PTSD) 05/27/2018   Chronic respiratory failure with hypoxia and hypercapnia ([place]) 06/22/2015   TRILOGY Vent >AVAPA-ES., Vt target 200-400, Max P 30 , PS max 20 , PS min 6-10 , E Max 6, E Min 4, Rate Auto AVAPS Rate 2 (titrate for pt comfort) , bleed O2 at 5l/m continuous flow .    CKD (chronic kidney disease) stage 3, GFR 30-59 ml/min (HCC) 12/15/2016   Closed displaced fracture of fifth metacarpal bone 03/21/2018   Cocaine use disorder, severe, in sustained remission ([place]) 63/03/3543   Complication of anesthesia    decreased bp, decreased heart rate   COPD (chronic obstructive pulmonary disease) ([place]) 07/08/2014   Depression  Diabetic neuropathy ([place]) 04/24/2011   Difficulty with speech 01/24/2018   Disorder of nervous system    Drug abuse ([place]) 11/21/2017   Dyslipidemia 04/24/2011   Elevated troponin 04/28/2012   Emphysema    Encephalopathy 11/21/2017   Essential hypertension 03/22/2016   Fibula fracture 07/10/2016   Frequent falls 10/11/2017   GERD (gastroesophageal reflux disease)    Gout 04/11/2017   Heart attack ([place]) 1980s   History of blood transfusion 1994   "couldn't stop bleeding from my period"   History of drug abuse in remission ([place]) 11/28/2015   Quit in 2017   Hyperlipidemia LDL goal <70    Incontinence    Manic depression ([place])    Morbid  obesity ([place]) 10/18/2010   Obstructive sleep apnea 10/18/2010   On home oxygen therapy    "6L; 24/7" (04/10/2016)   OSA on CPAP    "wear mask sometimes" (04/10/2016)   Paranoid ([place])    "sometimes; I'm on RX for it" (04/10/2016)   Prolonged Q-T interval on ECG    Rectal bleeding 12/31/2015   Right carotid bruit 11/09/2016   Schizoaffective disorder, bipolar type ([place]) 04/05/2018   Seasonal allergies    Seborrheic keratoses 12/31/2013   Seizures ([place])    "don't know what kind; last one was ~ 1 yr ago" (04/10/2016)   Stroke [facility]) 1980s   denies residual on 04/10/2016   Thrush 09/19/2013   Type 2 diabetes mellitus ([place]) 10/18/2010    Past Surgical History:  Procedure Laterality Date   CESAREAN SECTION  1997   HERNIA REPAIR     IR CHOLANGIOGRAM EXISTING TUBE  07/20/2016   IR PERC CHOLECYSTOSTOMY  05/10/2016   IR RADIOLOGIST EVAL & MGMT  06/08/2016   IR RADIOLOGIST EVAL & MGMT  06/29/2016   IR SINUS/FIST TUBE CHK-NON GI  07/12/2016   RIGHT/LEFT HEART CATH AND CORONARY ANGIOGRAPHY N/A 06/19/2017   Procedure: RIGHT/LEFT HEART CATH AND CORONARY ANGIOGRAPHY;  Surgeon: Jolaine Artist, MD;  Location: [facility];  Service: Cardiovascular;  Laterality: N/A;   TIBIA IM NAIL INSERTION Right 07/12/2016   Procedure: INTRAMEDULLARY (IM) NAIL RIGHT TIBIA;  Surgeon: Leandrew Koyanagi, MD;  Location: [facility];  Service: Orthopedics;  Laterality: Right;   UMBILICAL HERNIA REPAIR  ~ 1963   "that's why I don't have a belly button"   VAGINAL HYSTERECTOMY       reports that she quit smoking about 4 years ago. Her smoking use included cigarettes. She started smoking about 43 years ago. She has a 57.00 pack-year smoking history. She has never used smokeless tobacco. She reports that she does not drink alcohol and does not use drugs.  Allergies  Allergen Reactions   Hydrocodone Shortness Of Breath   Hydroxyzine Anaphylaxis and Shortness Of Breath   Latuda [Lurasidone Hcl] Anaphylaxis   Magnesium-Containing  Compounds Anaphylaxis    Tolerated Ensure   Prednisone Anaphylaxis, Swelling and Other (See Comments)    Tongue swelling, lip swelling, throat swelling, per pt    Tramadol Anaphylaxis and Swelling   Codeine Nausea And Vomiting   Sulfa Antibiotics Itching   Tape Rash    Family History  Problem Relation Age of Onset   Cancer Father        prostate   Cancer Mother        lung   Depression Mother    Depression Sister    Anxiety disorder Sister    Schizophrenia Sister    Bipolar disorder Sister    Depression  Sister    Depression Brother    Heart failure Other        cousin     Prior to Admission medications   Medication Sig Start Date End Date Taking? Authorizing Provider  albuterol (PROVENTIL) (2.5 MG/3ML) 0.083% nebulizer solution Take 3 mLs (2.5 mg total) by nebulization every 6 (six) hours as needed for wheezing or shortness of breath. 03/17/20   Collene Gobble, MD  albuterol (VENTOLIN HFA) 108 (90 Base) MCG/ACT inhaler Inhale 2 puffs into the lungs every 6 (six) hours as needed for wheezing or shortness of breath. 03/17/20   Collene Gobble, MD  amLODipine (NORVASC) 5 MG tablet Take 1 tablet (5 mg total) by mouth daily. 07/29/20 08/28/20  Darliss Cheney, MD  asenapine (SAPHRIS) 5 MG SUBL 24 hr tablet Place 2 tablets (10 mg total) under the tongue 2 (two) times daily. 05/31/20   Cobos, Myer Peer, MD  colchicine 0.6 MG tablet Take 0.6 mg by mouth 2 (two) times daily. 05/22/20   [provider]  diazepam (VALIUM) 10 MG tablet Take 1 tablet (10 mg total) by mouth 3 (three) times daily as needed for anxiety. 05/31/20 08/29/20  Cobos, Myer Peer, MD  diclofenac Sodium (VOLTAREN) 1 % GEL Apply 4 g topically 4 (four) times daily. 05/21/20   Ladell Pier, MD  escitalopram (LEXAPRO) 10 MG tablet Take 1 tablet (10 mg total) by mouth daily. 05/20/20 05/20/21  Cobos, Myer Peer, MD  feeding supplement (ENSURE ENLIVE / ENSURE PLUS) LIQD Take 237 mLs by mouth 2 (two) times daily between meals.  02/26/20   Hongalgi, Lenis Dickinson, MD  Fluticasone-Umeclidin-Vilant (TRELEGY ELLIPTA) 100-62.5-25 MCG/INH AEPB Inhale 1 puff into the lungs daily. 03/17/20   Collene Gobble, MD  furosemide (LASIX) 80 MG tablet Take 1 tablet (80 mg total) by mouth daily. 06/07/20   [name], MD  gabapentin (NEURONTIN) 600 MG tablet Take 0.5 tablets (300 mg total) by mouth 2 (two) times daily. 05/16/20   Nita Sells, MD  lidocaine (LIDODERM) 5 % Place 1 patch onto the skin daily. Remove & Discard patch within 12 hours or as directed by MD 06/23/20   [name], MD  lisdexamfetamine (VYVANSE) 70 MG capsule Take 70 mg by mouth daily.    [provider]  Misc. Devices [place] Power wheelchair.  Diagnosis-frequent falls, bilateral knee osteoarthritis 05/31/20   [name], MD  Misc. Devices MISC Bedside Commode DX: frequent fall 07/30/20   Newlin, Charlane Ferretti, MD  montelukast (SINGULAIR) 10 MG tablet TAKE 1 TABLET(10 MG) BY MOUTH AT BEDTIME 07/27/20   [name], MD  omeprazole (PRILOSEC) 40 MG capsule TAKE 1 CAPSULE(40 MG) BY MOUTH DAILY 07/27/20   [name], MD  oxybutynin (DITROPAN) 5 MG tablet TAKE 1 TABLET(5 MG) BY MOUTH TWICE DAILY 05/26/20   [name], MD  potassium chloride SA (KLOR-CON) 20 MEQ tablet Take 2 tablets (40 mEq total) by mouth 2 (two) times daily. 07/14/20   [name], MD  prazosin (MINIPRESS) 2 MG capsule Take 2 mg by mouth at bedtime. 07/12/20   [provider]  prazosin (MINIPRESS) 5 MG capsule Take 5 mg by mouth 2 (two) times daily.    [provider]  thiamine 100 MG tablet Take 1 tablet (100 mg total) by mouth daily. 02/27/20   Hongalgi, Lenis Dickinson, MD  tiZANidine (ZANAFLEX) 4 MG tablet Take 1 tablet (4 mg total) by mouth every 8 (eight) hours as needed for muscle spasms. 05/25/20  [name], MD  topiramate (TOPAMAX) 25 MG tablet Take 1 tablet (25 mg total) by mouth 2 (two) times daily. 06/21/20 06/21/21  Cobos, Myer Peer, MD  VYVANSE 20 MG capsule  Take 20 mg by mouth daily as needed. 06/20/20   [provider]    Physical Exam: Vitals:   08/08/20 1926 08/08/20 1930 08/08/20 2000 08/08/20 2030  BP:  139/86 (!) 145/99 (!) 156/99  Pulse:  61 61 62  Resp:  11 12 13   Temp: (!) 97.4 F (36.3 C)     TempSrc:      SpO2:  97% 100% 99%    Constitutional: NAD, calm, comfortable, cachectic Eyes: PERRL, lids and conjunctivae normal ENMT: Mucous membranes are moist. Posterior pharynx clear of any exudate or lesions.Normal dentition.  Neck: normal, supple, no masses, no thyromegaly Respiratory: clear to auscultation bilaterally, no wheezing, no crackles. Normal respiratory effort. No accessory muscle use.  Cardiovascular: Regular rate and rhythm, no murmurs / rubs / gallops. No extremity edema. 2+ pedal pulses. No carotid bruits.  Abdomen: no tenderness, no masses palpated. No hepatosplenomegaly. Bowel sounds positive.  Musculoskeletal: no clubbing / cyanosis. No joint deformity upper and lower extremities. Good ROM, no contractures. Normal muscle tone.  Skin: Sacral decubitus present.  Small nodule just under patella on R leg. Neurologic: CN 2-12 grossly intact. Sensation intact, DTR normal. Strength 5/5 in all 4.  Psychiatric: Flat mood and affect, slow speech.   Labs on Admission: I have personally reviewed following labs and imaging studies  CBC: Recent Labs  Lab 08/08/20 1605  WBC 2.9*  NEUTROABS 1.5*  HGB 7.4*  HCT 24.2*  MCV 94.9  PLT 035   Basic Metabolic Panel: Recent Labs  Lab 08/08/20 1605  NA 136  K 3.6  CL 115*  CO2 14*  GLUCOSE 82  BUN 40*  CREATININE 3.83*  CALCIUM 7.9*  MG 1.3*   GFR: CrCl cannot be calculated (Unknown ideal weight.). Liver Function Tests: Recent Labs  Lab 08/08/20 1605  AST 13*  ALT 8  ALKPHOS 118  BILITOT 0.4  PROT 5.0*  ALBUMIN 2.7*   No results for input(s): LIPASE, AMYLASE in the last 168 hours. Recent Labs  Lab 08/08/20 1606  AMMONIA 15   Coagulation  Profile: Recent Labs  Lab 08/08/20 1605  INR 1.4*   Cardiac Enzymes: Recent Labs  Lab 08/08/20 1605  CKTOTAL 81   BNP (last 3 results) Recent Labs    06/04/20 1140 06/15/20 1636 07/13/20 1310  PROBNP 11,232* 2,313* 201   HbA1C: No results for input(s): HGBA1C in the last 72 hours. CBG: No results for input(s): GLUCAP in the last 168 hours. Lipid Profile: No results for input(s): CHOL, HDL, LDLCALC, TRIG, CHOLHDL, LDLDIRECT in the last 72 hours. Thyroid Function Tests: Recent Labs    08/08/20 1606  TSH 0.765   Anemia Panel: No results for input(s): VITAMINB12, FOLATE, FERRITIN, TIBC, IRON, RETICCTPCT in the last 72 hours. Urine analysis:    Component Value Date/Time   COLORURINE YELLOW 08/08/2020 1742   APPEARANCEUR HAZY (A) 08/08/2020 1742   LABSPEC 1.015 08/08/2020 1742   PHURINE 5.0 08/08/2020 1742   GLUCOSEU NEGATIVE 08/08/2020 1742   HGBUR MODERATE (A) 08/08/2020 1742   BILIRUBINUR NEGATIVE 08/08/2020 1742   BILIRUBINUR NEG 05/21/2014 1155   KETONESUR NEGATIVE 08/08/2020 1742   PROTEINUR 30 (A) 08/08/2020 1742   UROBILINOGEN 0.2 09/30/2019 1557   NITRITE NEGATIVE 08/08/2020 1742   LEUKOCYTESUR TRACE (A) 08/08/2020 1742    Radiological  Exams on Admission: CT ABDOMEN PELVIS WO CONTRAST  Result Date: 08/08/2020 CLINICAL DATA:  Flank pain, kidney stone suspected new AKI, AMS EXAM: CT ABDOMEN AND PELVIS WITHOUT CONTRAST TECHNIQUE: Multidetector CT imaging of the abdomen and pelvis was performed following the standard protocol without IV contrast. COMPARISON:  July 23, 2020. FINDINGS: Evaluation is limited by lack of IV contrast. Lower chest: No acute abnormality. Hepatobiliary: Cholelithiasis within the mildly distended gallbladder, similar in comparison to prior. No pericholecystic fat stranding. Unremarkable noncontrast appearance of the liver. Pancreas: No peripancreatic fat stranding. Spleen: Unremarkable. Adrenals/Urinary Tract: Unchanged low-density LEFT  adrenal nodule, likely a adrenal adenoma. RIGHT adrenal gland is unremarkable. No hydronephrosis. No nephrolithiasis. Bladder is decompressed, limiting evaluation. Stomach/Bowel: Colon is distended with fluid. Appendiceal candidate is normal; there is no ancillary evidence of appendicitis. No evidence of bowel obstruction. No pneumatosis or portal venous gas. Vascular/Lymphatic: Minimal atherosclerotic calcifications of the aorta. No suspicious lymphadenopathy visualized. Reproductive: Unchanged 8.4 cm LEFT adnexal cyst. Status post hysterectomy. Other: No free air.  Status post periumbilical hernia repair. Musculoskeletal: Remote LEFT-sided rib fractures. No acute osseous abnormality. IMPRESSION: 1. The colon is nondilated but is distended with fluid. This could reflect a nonspecific colitis. No definitive noncontrast evidence of bowel ischemia. 2. Stable appearance of a LEFT adnexal cyst for which a follow-up ultrasound in 6-12 months is recommended. 3. Similar appearance of cholelithiasis without evidence of cholecystitis. Electronically Signed   By: Valentino Saxon MD   On: 08/08/2020 18:51   CT Head Wo Contrast  Result Date: 08/08/2020 CLINICAL DATA:  BIB EMS from home, family reports that patient has been weak and lethargic since hospital discharge, over 5 days they have noticed bright red blood in stool. W/ EMS pt had pressure of 100/50 and then 90/40, then 80/40, pt refused EMS staring an IV. When EMS arrived pt had her BP pills in her hand, told EMS she would eat them, EMS removed her meds. Hx of psych issues. EXAM: CT HEAD WITHOUT CONTRAST TECHNIQUE: Contiguous axial images were obtained from the base of the skull through the vertex without intravenous contrast. COMPARISON:  07/22/2020 FINDINGS: Brain: No evidence of acute infarction, hemorrhage, hydrocephalus, extra-axial collection or mass lesion/mass effect. Patchy periventricular white matter hypoattenuation is noted consistent with mild chronic  microvascular ischemic change, stable. Vascular: No hyperdense vessel or unexpected calcification. Skull: Normal. Negative for fracture or focal lesion. Sinuses/Orbits: Globes and orbits are unremarkable. Visualized sinuses are clear. Other: None. IMPRESSION: 1. No acute intracranial abnormalities. No change from the prior study. Electronically Signed   By: Lajean Manes M.D.   On: 08/08/2020 15:43   DG Chest Port 1 View  Result Date: 08/08/2020 CLINICAL DATA:  Sepsis. EXAM: PORTABLE CHEST 1 VIEW COMPARISON:  February 18, 2020. FINDINGS: Stable cardiomediastinal silhouette. Both lungs are clear. The visualized skeletal structures are unremarkable. IMPRESSION: No active disease. Electronically Signed   By: Marijo Conception M.D.   On: 08/08/2020 15:49    EKG: Independently reviewed.  Assessment/Plan Principal Problem:   AKI (acute kidney injury) ([place]) Active Problems:   Type 2 diabetes mellitus (HCC)   Anemia   Chronic respiratory failure with hypoxia (HCC)   History of drug abuse in remission [facility])   Essential hypertension   Stage 2 skin ulcer of sacral region [facility])   Pelvic mass in female   Acute metabolic encephalopathy   Schizophrenia, schizo-affective (HCC)   Metabolic acidosis, normal anion gap (NAG)    AKI on CKD 3a - Likely related  to hypotension today, ? Dehydration from diarrhea as cause? IVF: Boluses in ED Start maint with isotonic bicarb given the NAG acidosis Repeat H/H (may need transfusion). Strict intake and output No obvious obstruction on CT AP today Hold lasix Repeat BMP in AM Anemia - ? Of blood loss Today just having diarrhea in ED that is hemoccult neg, but report by family of BRBPR over past 5 days. Repeat H/H now Suspect she may need transfusion due to dilution from IVF if nothing else. Monitor for active bleeding Repeat CBC in AM H/o HTN - Initial hypotension in ED today ? OD on BP meds today Holding all home BP meds for now Acute metabolic  encephalopathy - Likely due to hypotension DDx includes possible OD Seems to be resolved at this time Schizoaffective disorder / schizophrenia - Holding home psych meds for tonight (due to ? Of OD). Consider resuming tomorrow depending on pt status. COPD - chronic O2 requirement Cont O2 via [place] No acute respiratory symptoms, CXR findings, nor acute worsening today Adnexal cyst - Stable thus far Needs f/u US in 6-12 months Sacral decubitus - wound care consult H/o DM2 - Well controlled: chart diagnosis only at this point A1C of 6 and didn't require insulin at all during last admit.  DVT prophylaxis: SCDs Code Status: Full Family Communication: Unable to get a-hold of family Disposition Plan: TBD, pending clinical improvement Consults called: None Admission status: Admit to inpatient  Severity of Illness: The appropriate patient status for this patient is INPATIENT. Inpatient status is judged to be reasonable and necessary in order to provide the required intensity of service to ensure the patient's safety. The patient's presenting symptoms, physical exam findings, and initial radiographic and laboratory data in the context of their chronic comorbidities is felt to place them at high risk for further clinical deterioration. Furthermore, it is not anticipated that the patient will be medically stable for discharge from the hospital within 2 midnights of admission. The following factors support the patient status of inpatient.   Patient has acute kidney injury.  Patient has one of the following: Increase in Serum Creatinine >0.3 mg/dL within 48h Increase in Serum Creatinine > 1.5 times baseline known or presumed to have been within the last 7 days Urine volume < 0.5 ml/kg/hr for 6 hours    * I certify that at the point of admission it is my clinical judgment that the patient will require inpatient hospital care spanning beyond 2 midnights from the point of admission due to high  intensity of service, high risk for further deterioration and high frequency of surveillance required.*   Tiffany [name] M. DO [facility]  How to contact the [facility] Attending or Consulting provider [place] or covering provider during after hours [place], for this patient?  Check the care team in [facility] and look for a) attending/consulting [facility] provider listed and b) the [facility] team listed Log into [email or web address]  Amion Physician Scheduling and messaging for groups and whole hospitals  On call and physician scheduling software for group practices, residents, hospitalists and other medical providers for call, clinic, rotation and shift schedules. OnCall [employer] is a hospital-wide system for scheduling doctors and paging doctors on call. EasyPlot is for scientific plotting and data analysis.  [email or web address]  and use [place]'s universal password to access. If you do not have the password, please contact the hospital operator.  Locate the [facility] provider you are looking for under [facility] and page to a number that  you can be directly reached. If you still have difficulty reaching the provider, please page the [facility] (Director on Call) for the Hospitalists listed on amion for assistance.  08/08/2020, 9:17 PM

## 2020-08-08 NOTE — ED Triage Notes (Signed)
BIB EMS from home, family reports that patient has been weak and lethargic since hospital discharge, over 5 days they have noticed bright red blood in stool. W/ EMS pt had pressure of 100/50 and then 90/40, then 80/40, pt refused EMS staring an IV. When EMS arrived pt had her BP pills in her hand, told EMS she would eat them, EMS removed her meds. Hx of psych issues.

## 2020-08-09 ENCOUNTER — Encounter (HOSPITAL_COMMUNITY): Payer: Self-pay

## 2020-08-09 ENCOUNTER — Encounter (HOSPITAL_COMMUNITY): Payer: Self-pay | Admitting: Internal Medicine

## 2020-08-09 ENCOUNTER — Telehealth (HOSPITAL_COMMUNITY): Payer: 59 | Admitting: Psychiatry

## 2020-08-09 DIAGNOSIS — N179 Acute kidney failure, unspecified: Secondary | ICD-10-CM | POA: Diagnosis not present

## 2020-08-09 LAB — GLUCOSE, CAPILLARY
Glucose-Capillary: 71 mg/dL (ref 70–99)
Glucose-Capillary: 77 mg/dL (ref 70–99)
Glucose-Capillary: 75 mg/dL (ref 70–99)

## 2020-08-09 LAB — COMPREHENSIVE METABOLIC PANEL
ALT: 9 U/L (ref 0–44)
ALT: 9 U/L (ref 0–44)
AST: 16 U/L (ref 15–41)
AST: 16 U/L (ref 15–41)
Albumin: 3.1 g/dL — ABNORMAL LOW (ref 3.5–5.0)
Albumin: 2.7 g/dL — ABNORMAL LOW (ref 3.5–5.0)
Alkaline Phosphatase: 134 U/L — ABNORMAL HIGH (ref 38–126)
Alkaline Phosphatase: 119 U/L (ref 38–126)
Anion gap: 12 (ref 5–15)
Anion gap: 9 (ref 5–15)
BUN: 39 mg/dL — ABNORMAL HIGH (ref 8–23)
BUN: 33 mg/dL — ABNORMAL HIGH (ref 8–23)
CO2: 16 mmol/L — ABNORMAL LOW (ref 22–32)
CO2: 23 mmol/L (ref 22–32)
Calcium: 8.4 mg/dL — ABNORMAL LOW (ref 8.9–10.3)
Calcium: 8 mg/dL — ABNORMAL LOW (ref 8.9–10.3)
Chloride: 109 mmol/L (ref 98–111)
Chloride: 103 mmol/L (ref 98–111)
Creatinine, Ser: 3.49 mg/dL — ABNORMAL HIGH (ref 0.44–1.00)
Creatinine, Ser: 2.6 mg/dL — ABNORMAL HIGH (ref 0.44–1.00)
GFR, Estimated: 14 mL/min — ABNORMAL LOW (ref >60)
GFR, Estimated: 20 mL/min — ABNORMAL LOW (ref >60)
Glucose, Bld: 68 mg/dL — ABNORMAL LOW (ref 70–99)
Glucose, Bld: 79 mg/dL (ref 70–99)
Potassium: 3.5 mmol/L (ref 3.5–5.1)
Potassium: 3 mmol/L — ABNORMAL LOW (ref 3.5–5.1)
Sodium: 137 mmol/L (ref 135–145)
Sodium: 135 mmol/L (ref 135–145)
Total Bilirubin: 0.5 mg/dL (ref 0.3–1.2)
Total Bilirubin: 0.5 mg/dL (ref 0.3–1.2)
Total Protein: 5.7 g/dL — ABNORMAL LOW (ref 6.5–8.1)
Total Protein: 5.3 g/dL — ABNORMAL LOW (ref 6.5–8.1)

## 2020-08-09 LAB — CBC
HCT: 31.2 % — ABNORMAL LOW (ref 36.0–46.0)
HCT: 27.3 % — ABNORMAL LOW (ref 36.0–46.0)
Hemoglobin: 9.4 g/dL — ABNORMAL LOW (ref 12.0–15.0)
Hemoglobin: 8.7 g/dL — ABNORMAL LOW (ref 12.0–15.0)
MCH: 28.7 pg (ref 26.0–34.0)
MCH: 28.9 pg (ref 26.0–34.0)
MCHC: 30.1 g/dL (ref 30.0–36.0)
MCHC: 31.9 g/dL (ref 30.0–36.0)
MCV: 95.4 fL (ref 80.0–100.0)
MCV: 90.7 fL (ref 80.0–100.0)
Platelets: 359 10*3/uL (ref 150–400)
Platelets: 357 10*3/uL (ref 150–400)
RBC: 3.27 MIL/uL — ABNORMAL LOW (ref 3.87–5.11)
RBC: 3.01 MIL/uL — ABNORMAL LOW (ref 3.87–5.11)
RDW: 15.9 % — ABNORMAL HIGH (ref 11.5–15.5)
RDW: 15.8 % — ABNORMAL HIGH (ref 11.5–15.5)
WBC: 3.6 10*3/uL — ABNORMAL LOW (ref 4.0–10.5)
WBC: 3.4 10*3/uL — ABNORMAL LOW (ref 4.0–10.5)
nRBC: 0 % (ref 0.0–0.2)
nRBC: 0 % (ref 0.0–0.2)

## 2020-08-09 LAB — C DIFFICILE QUICK SCREEN W PCR REFLEX
C Diff antigen: NONREACTIVE — AB
C Diff interpretation: NEGATIVE
C Diff toxin: NONREACTIVE — AB

## 2020-08-09 LAB — OCCULT BLOOD X 1 CARD TO LAB, STOOL: Fecal Occult Bld: NEGATIVE

## 2020-08-09 LAB — MAGNESIUM: Magnesium: 1.3 mg/dL — ABNORMAL LOW (ref 1.7–2.4)

## 2020-08-09 LAB — LACTIC ACID, PLASMA: Lactic Acid, Venous: 0.8 mmol/L (ref 0.5–1.9)

## 2020-08-09 LAB — MRSA NEXT GEN BY PCR, NASAL: MRSA by PCR Next Gen: NOT DETECTED

## 2020-08-09 MED ORDER — HYDRALAZINE HCL 25 MG PO TABS: 25.0000 mg | ORAL_TABLET | Freq: Four times a day (QID) | ORAL | Status: DC | PRN

## 2020-08-09 MED ORDER — NALOXONE HCL 0.4 MG/ML IJ SOLN
INTRAMUSCULAR | Status: AC
  Filled 2020-08-09: qty 1

## 2020-08-09 MED ORDER — ASENAPINE MALEATE 5 MG SL SUBL: 10.0000 mg | SUBLINGUAL_TABLET | Freq: Two times a day (BID) | SUBLINGUAL | Status: DC

## 2020-08-09 MED ORDER — FLUMAZENIL 0.5 MG/5ML IV SOLN: 0.2000 mg | Freq: Once | INTRAVENOUS | Status: DC

## 2020-08-09 MED ORDER — ALBUTEROL SULFATE (2.5 MG/3ML) 0.083% IN NEBU: 2.5000 mg | INHALATION_SOLUTION | Freq: Four times a day (QID) | RESPIRATORY_TRACT | Status: DC | PRN

## 2020-08-09 MED ORDER — NALOXONE HCL 0.4 MG/ML IJ SOLN: 0.4000 mg | INTRAMUSCULAR | Status: DC | PRN

## 2020-08-09 MED ORDER — PRAZOSIN HCL 5 MG PO CAPS
5.0000 mg | ORAL_CAPSULE | Freq: Two times a day (BID) | ORAL | Status: DC
  Administered 2020-08-09: 5 mg via ORAL
  Filled 2020-08-09: qty 1

## 2020-08-09 MED ORDER — POTASSIUM CHLORIDE CRYS ER 20 MEQ PO TBCR
40.0000 meq | EXTENDED_RELEASE_TABLET | Freq: Once | ORAL | Status: AC
  Administered 2020-08-09: 40 meq via ORAL
  Filled 2020-08-09: qty 2

## 2020-08-09 MED ORDER — DIAZEPAM 5 MG PO TABS: 10.0000 mg | ORAL_TABLET | Freq: Three times a day (TID) | ORAL | Status: DC | PRN

## 2020-08-09 MED ORDER — POTASSIUM CHLORIDE 10 MEQ/100ML IV SOLN
10.0000 meq | INTRAVENOUS | Status: AC
  Administered 2020-08-10 (×4): 10 meq via INTRAVENOUS
  Filled 2020-08-09 (×4): qty 100

## 2020-08-09 MED ORDER — FLUTICASONE FUROATE-VILANTEROL 100-25 MCG/INH IN AEPB
1.0000 | INHALATION_SPRAY | Freq: Every day | RESPIRATORY_TRACT | Status: DC
  Administered 2020-08-10 – 2020-08-16 (×7): 1 via RESPIRATORY_TRACT
  Filled 2020-08-09: qty 28

## 2020-08-09 MED ORDER — POTASSIUM CHLORIDE CRYS ER 20 MEQ PO TBCR
40.0000 meq | EXTENDED_RELEASE_TABLET | Freq: Once | ORAL | Status: DC
  Filled 2020-08-09: qty 2

## 2020-08-09 MED ORDER — ASENAPINE MALEATE 2.5 MG SL SUBL
10.0000 mg | SUBLINGUAL_TABLET | Freq: Two times a day (BID) | SUBLINGUAL | Status: DC
  Administered 2020-08-09: 5 mg via SUBLINGUAL
  Filled 2020-08-09 (×2): qty 4

## 2020-08-09 MED ORDER — NALOXONE HCL 0.4 MG/ML IJ SOLN
0.4000 mg | INTRAMUSCULAR | Status: DC | PRN
  Administered 2020-08-09: 0.4 mg via INTRAVENOUS

## 2020-08-09 MED ORDER — NALOXONE HCL 0.4 MG/ML IJ SOLN: 0.4000 mg | Freq: Once | INTRAMUSCULAR | Status: AC

## 2020-08-09 MED ORDER — LOPERAMIDE HCL 2 MG PO CAPS
2.0000 mg | ORAL_CAPSULE | ORAL | Status: DC | PRN
  Administered 2020-08-09 – 2020-08-11 (×6): 2 mg via ORAL
  Filled 2020-08-09 (×6): qty 1

## 2020-08-09 MED ORDER — MONTELUKAST SODIUM 10 MG PO TABS
10.0000 mg | ORAL_TABLET | Freq: Every day | ORAL | Status: DC
  Administered 2020-08-09 – 2020-08-15 (×7): 10 mg via ORAL
  Filled 2020-08-09 (×7): qty 1

## 2020-08-09 MED ORDER — UMECLIDINIUM BROMIDE 62.5 MCG/INH IN AEPB
1.0000 | INHALATION_SPRAY | Freq: Every day | RESPIRATORY_TRACT | Status: DC
  Administered 2020-08-10 – 2020-08-16 (×7): 1 via RESPIRATORY_TRACT
  Filled 2020-08-09 (×2): qty 7

## 2020-08-09 MED ORDER — CHLORHEXIDINE GLUCONATE CLOTH 2 % EX PADS
6.0000 | MEDICATED_PAD | Freq: Every day | CUTANEOUS | Status: DC
  Administered 2020-08-09 – 2020-08-12 (×3): 6 via TOPICAL

## 2020-08-09 MED ORDER — PRAZOSIN HCL 1 MG PO CAPS
2.0000 mg | ORAL_CAPSULE | Freq: Every day | ORAL | Status: DC
  Filled 2020-08-09: qty 2

## 2020-08-09 MED ORDER — TOPIRAMATE 25 MG PO TABS
25.0000 mg | ORAL_TABLET | Freq: Two times a day (BID) | ORAL | Status: DC
  Administered 2020-08-09 – 2020-08-16 (×15): 25 mg via ORAL
  Filled 2020-08-09 (×16): qty 1

## 2020-08-09 MED ORDER — PANTOPRAZOLE SODIUM 40 MG PO TBEC
80.0000 mg | DELAYED_RELEASE_TABLET | Freq: Every day | ORAL | Status: DC
  Administered 2020-08-09 – 2020-08-16 (×8): 80 mg via ORAL
  Filled 2020-08-09 (×8): qty 2

## 2020-08-09 MED ORDER — NALOXONE HCL 0.4 MG/ML IJ SOLN: 0.4000 mg | Freq: Once | INTRAMUSCULAR | Status: DC

## 2020-08-09 MED ORDER — ESCITALOPRAM OXALATE 10 MG PO TABS
10.0000 mg | ORAL_TABLET | Freq: Every day | ORAL | Status: DC
  Administered 2020-08-09 – 2020-08-16 (×8): 10 mg via ORAL
  Filled 2020-08-09 (×8): qty 1

## 2020-08-09 MED ORDER — FLUTICASONE-UMECLIDIN-VILANT 100-62.5-25 MCG/INH IN AEPB: 1.0000 | INHALATION_SPRAY | Freq: Every day | RESPIRATORY_TRACT | Status: DC

## 2020-08-09 NOTE — Progress Notes (Signed)
Pt. Was unresponsive to voice and commands when came in to check on her around 1700. Checked blood glucose and bg was 77. Polly Cobia RN was called in to help assess the situation. Dr. [place] was paged and he ordered 0.4mg  of narcan iv push. Patients vitals remained stable and patient started to become more responsive and started to follow commands. After cleaning her up a bladder scan was ordered which showed > 400 ml of urine. Dr. [place] ordered a foley to be placed. Dr. [place] seems to think this episode is coming from her psych meds that she started back on today. Pt.is now Alert and oriented x4 and remains incontinent of stool. The patient is still very drowsy. GI panel was sent down for testing today.

## 2020-08-09 NOTE — Progress Notes (Signed)
08/09/2020  4,05 PM  Patient had scheduled tele-psychiatry appointment today . Contacted patient - no answer Upon chart review noted patient admitted to hospital yesterday (medical admission) for hypotension. Will ask front desk staff to contact patient once discharged in order to offer follow-up appointment if needed.  Gabriel Earing, MD

## 2020-08-09 NOTE — Telephone Encounter (Signed)
FYI, patient has upcoming appointment.

## 2020-08-09 NOTE — Progress Notes (Signed)
PROGRESS NOTE    Tiffany [name]  HWE:[id number] DOB: 11-14-1960 DOA: 08/08/2020 PCP: [name], MD    Brief Narrative:  Tiffany [name] is a 60 year old female with past medical history significant for COPD, chronic diastolic congestive heart failure, essential hypertension, CKD stage IIIa, anemia of chronic medical disease, history of substance abuse, history of cardiac arrest 2018 with subsequent anoxic brain injury, schizoaffective disorder who presented to [facility] ED via EMS with weakness and lethargy.  Also family reported bright red blood in her stool.  On EMS arrival, they reported hypotension with BP 80/40.   Patient with recent admission earlier this month for nausea/vomiting/diarrhea with associated metabolic derangements and altered mental status with improvement after IV fluid hydration and repletion of her electrolytes.  C. difficile PCR and GI pathogen panel were negative during that admission.  It was recommended that she go to SNF but she declined during that admission.  In the ED, BP 62/30, temperature 97.9 F, HR 71, RR 14, SPO2 99% on room air.  Sodium 136, potassium 3.6, chloride 115, CO2 14, BUN 40, creatinine 3.83 with baseline 1.2-1.3, magnesium 1.3, AST 13, ALT 8, total bilirubin 0.4.  Lactic acid 1.1.  WBC 2.9, hemoglobin 7.4, platelets 308.  Tylenol level less than 10, salicylate level less than 7.0.  TSH 0.765.  FOBT negative.  EtOH level less than 10.  CT head without contrast with no acute intracranial process.  Chest x-ray with no acute cardiopulmonary disease process.  Hospital service was consulted for further evaluation and management of altered mental status, hypotension, acute on chronic renal failure and diarrhea.   Assessment & Plan:   Principal Problem:   AKI (acute kidney injury) ([place]) Active Problems:   Type 2 diabetes mellitus (HCC)   Anemia   Chronic respiratory failure with hypoxia (HCC)   History of drug abuse in remission [facility])   Essential  hypertension   Stage 2 skin ulcer of sacral region [facility])   Pelvic mass in female   Acute metabolic encephalopathy   Schizophrenia, schizo-affective (HCC)   Metabolic acidosis, normal anion gap (NAG)   Acute metabolic encephalopathy, POA; improving Patient presenting to the ED via EMS with confusion.  Patient is afebrile without leukocytosis.  Chest x-ray with no acute cardiopulmonary disease process.  Urinalysis unrevealing.  Lactic acid 1.0.  Cova-19/influenza A/B PCR negative.  CT head with no acute intracranial finding. --Continue supportive care and treatment as below  AKI on CKD stage IIIb Non-anion gap metabolic acidosis, POA Viral gastroenteritis Patient presenting with recurrent diarrhea with poor oral intake.  Baseline creatinine 1.2-1.3.  Creatinine on arrival 3.83.  Patient is afebrile without leukocytosis.  CT abdomen/pelvis with: Nondilated but distended with fluid likely consistent with nonspecific colitis.  Etiology likely volume depletion in the setting of profuse diarrhea.  C. difficile PCR negative. --Cr 3.83>3.49 --Continue IVF with sodium bicarbonate at 125 mL/h --GI PCR: Pending --Avoid nephrotoxins, renally dose all medications --Continue enteric precautions --Monitor BMP daily  Hypotension Hx of essential hypertension On amlodipine 5 mg p.o. daily and furosemide 80 mg p.o. daily at home.  BP 62/30 on arrival.  Received IV fluid hydration. --BP much improved, 157/95 this morning. --Continue to hold home antihypertensives --Hydralazine 25mg  PO q6h prn SBP >180 or DBP >110 -- Continue to closely monitor BP  Urinary retention RN reports poor urine output today, bladder scan with 411 mL of retained urine. --In and Out cath q8h prn retained urine >364mL --Hopefully will improve once diarrhea improves and  with ambulation  Chronic diastolic congestive heart failure On furosemide 80 mg p.o. daily at baseline.  TTE 05/13/2020 with LVEF 55-60% with grade 1 diastolic  dysfunction. --Continue to hold home furosemide given significant hypotension on arrival with associated poor oral intake and diarrhea. --Strict I's and O's and daily weights  Anemia of chronic medical disease Patient's family reported bright red blood per rectum.  FOBT x2 on admission negative.  Hemoglobin stable.  No signs of blood in stool reported by nursing staff since hospitalization. --Hgb 7.4>8.2>9.4  COPD --Montelukast 10 mg p.o. daily --Trelegy Ellipta 1 puff daily --Albuterol neb as needed shortness of breath/wheezing  Schizoaffective disorder Depression/anxiety --Valium 10mg  PO TID prn anxiety --Topamax 25mg  PO BID --Asenapine 10mg  SL BID --Lexapro 10 mg p.o. daily --Prazosin 5 mg p.o. twice daily, 2 mg p.o. nightly  GERD: continue PPI  Left adnexal cyst Unchanged 8.4 cm left adnexal cyst on CT abdomen/pelvis on admission.  Recommend follow-up pelvic ultrasound in 6-12 months for further surveillance.  Weakness/deconditioning/debility: Patient recently hospitalized with recommendations of SNF on discharge, but she subsequently declined. --PT/OT evaluation  DVT prophylaxis: SCDs Start: 08/08/20 1939   Code Status: Full Code Family Communication: Family present at bedside this morning  Disposition Plan:  Level of care: Med-Surg Status is: Inpatient  Remains inpatient appropriate because:Persistent severe electrolyte disturbances, Ongoing diagnostic testing needed not appropriate for outpatient work up, Unsafe d/c plan, IV treatments appropriate due to intensity of illness or inability to take PO, and Inpatient level of care appropriate due to severity of illness  Dispo: The patient is from: Home              Anticipated d/c is to:  To be determined              Patient currently is not medically stable to d/c.   Difficult to place patient No   Consultants:  None  Procedures:  None  Antimicrobials:  None   Subjective: Patient seen examined bedside,  resting comfortably.  RN present.  Continues with diarrhea and mild abdominal discomfort, 3 episodes reported overnight.  Blood pressure much improved following IV fluid hydration.  Kidney function slightly improved as well.  No other complaints or concerns at this time.  FOBT has been negative x2 and C. difficile PCR negative.  Pending GI panel PCR.  No family present at bedside this morning.  Patient denies headache, no fever/chills/night sweats, no nausea/vomiting, no chest pain, no palpitations.  No acute events overnight per nursing staff.  Objective: Vitals:   08/09/20 0900 08/09/20 1000 08/09/20 1100 08/09/20 1200  BP: 126/84 134/83 (!) 143/94   Pulse: 81 80 76   Resp: 13 15 16    Temp:    98.4 F (36.9 C)  TempSrc:    Axillary  SpO2: 100% 99% 99%   Weight:        Intake/Output Summary (Last 24 hours) at 08/09/2020 1232 Last data filed at 08/09/2020 1100 Gross per 24 hour  Intake 1167.53 ml  Output 100 ml  Net 1067.53 ml   Filed Weights   08/09/20 0100  Weight: 52.6 kg    Examination:  General exam: Appears calm and comfortable, ill in appearance Respiratory system: Clear to auscultation. Respiratory effort normal.  On room air Cardiovascular system: S1 & S2 heard, RRR. No JVD, murmurs, rubs, gallops or clicks. No pedal edema. Gastrointestinal system: Abdomen is nondistended, soft and mild tenderness to palpation diffusely, no organomegaly or masses felt. Normal bowel sounds heard. Central  nervous system: Alert and oriented. No focal neurological deficits. Extremities: Symmetric 5 x 5 power. Skin: No rashes, lesions or ulcers Psychiatry: Judgement and insight appear poor. Mood & affect appropriate.     Data Reviewed: I have personally reviewed following labs and imaging studies  CBC: Recent Labs  Lab 08/08/20 1605 08/08/20 2133 08/09/20 0221  WBC 2.9*  --  3.6*  NEUTROABS 1.5*  --   --   HGB 7.4* 8.2* 9.4*  HCT 24.2* 27.0* 31.2*  MCV 94.9  --  95.4  PLT 308   --  505   Basic Metabolic Panel: Recent Labs  Lab 08/08/20 1605 08/09/20 0221  NA 136 137  K 3.6 3.5  CL 115* 109  CO2 14* 16*  GLUCOSE 82 68*  BUN 40* 39*  CREATININE 3.83* 3.49*  CALCIUM 7.9* 8.4*  MG 1.3*  --    GFR: Estimated Creatinine Clearance: 14.1 mL/min (A) (by C-G formula based on SCr of 3.49 mg/dL (H)). Liver Function Tests: Recent Labs  Lab 08/08/20 1605 08/09/20 0221  AST 13* 16  ALT 8 9  ALKPHOS 118 134*  BILITOT 0.4 0.5  PROT 5.0* 5.7*  ALBUMIN 2.7* 3.1*   No results for input(s): LIPASE, AMYLASE in the last 168 hours. Recent Labs  Lab 08/08/20 1606  AMMONIA 15   Coagulation Profile: Recent Labs  Lab 08/08/20 1605  INR 1.4*   Cardiac Enzymes: Recent Labs  Lab 08/08/20 1605  CKTOTAL 81   BNP (last 3 results) Recent Labs    06/04/20 1140 06/15/20 1636 07/13/20 1310  PROBNP 11,232* 2,313* 201   HbA1C: No results for input(s): HGBA1C in the last 72 hours. CBG: Recent Labs  Lab 08/09/20 0113  GLUCAP 71   Lipid Profile: No results for input(s): CHOL, HDL, LDLCALC, TRIG, CHOLHDL, LDLDIRECT in the last 72 hours. Thyroid Function Tests: Recent Labs    08/08/20 1606  TSH 0.765   Anemia Panel: No results for input(s): VITAMINB12, FOLATE, FERRITIN, TIBC, IRON, RETICCTPCT in the last 72 hours. Sepsis Labs: Recent Labs  Lab 08/08/20 1605 08/08/20 1851  LATICACIDVEN 1.1 1.0    Recent Results (from the past 240 hour(s))  Blood Culture (routine x 2)     Status: None (Preliminary result)   Collection Time: 08/08/20  4:06 PM   Specimen: BLOOD  Result Value Ref Range Status   Specimen Description   Final    BLOOD LEFT ANTECUBITAL Performed at [facility] [address], [place], [place] [address]    Special Requests   Final    BOTTLES DRAWN AEROBIC AND ANAEROBIC Blood Culture results may not be optimal due to an inadequate volume of blood received in culture bottles Performed at [facility] [address] [place], [place] [address]    Culture   Final    NO GROWTH < 24 HOURS Performed at [facility] [address] [place], [place] [address]    Report Status PENDING  Incomplete  Resp Panel by RT-PCR (Flu A&B, Covid) Nasopharyngeal Swab     Status: None   Collection Time: 08/08/20  6:04 PM   Specimen: Nasopharyngeal Swab; Nasopharyngeal(NP) swabs in vial transport medium  Result Value Ref Range Status   SARS Coronavirus 2 by RT PCR NEGATIVE NEGATIVE Final    Comment: (NOTE) SARS-CoV-2 target nucleic acids are NOT DETECTED.  The SARS-CoV-2 RNA is generally detectable in upper respiratory specimens during the acute phase of infection. The lowest concentration of SARS-CoV-2  viral copies this assay can detect is 138 copies/mL. A negative result does not preclude SARS-Cov-2 infection and should not be used as the sole basis for treatment or other patient management decisions. A negative result may occur with  improper specimen collection/handling, submission of specimen other than nasopharyngeal swab, presence of viral mutation(s) within the areas targeted by this assay, and inadequate number of viral copies(<138 copies/mL). A negative result must be combined with clinical observations, patient history, and epidemiological information. The expected result is Negative.  Fact Sheet for Patients:  EntrepreneurPulse.com.au  Fact Sheet for Healthcare Providers:  IncredibleEmployment.be  This test is no t yet approved or cleared by the [place] FDA and  has been authorized for detection and/or diagnosis of SARS-CoV-2 by FDA under an Emergency Use Authorization (EUA). This EUA will remain  in effect (meaning this test can be used) for the duration of the COVID-19 declaration under Section 564(b)(1) of the Act, 21 U.S.C.section 360bbb-3(b)(1), unless the authorization is terminated  or revoked sooner.        Influenza A by PCR NEGATIVE NEGATIVE Final   Influenza B by PCR NEGATIVE NEGATIVE Final    Comment: (NOTE) The Xpert Xpress SARS-CoV-2/FLU/RSV plus assay is intended as an aid in the diagnosis of influenza from Nasopharyngeal swab specimens and should not be used as a sole basis for treatment. Nasal washings and aspirates are unacceptable for Xpert Xpress SARS-CoV-2/FLU/RSV testing.  Fact Sheet for Patients: EntrepreneurPulse.com.au  Fact Sheet for Healthcare Providers: IncredibleEmployment.be  This test is not yet approved or cleared by the [place] FDA and has been authorized for detection and/or diagnosis of SARS-CoV-2 by FDA under an Emergency Use Authorization (EUA). This EUA will remain in effect (meaning this test can be used) for the duration of the COVID-19 declaration under Section 564(b)(1) of the Act, 21 U.S.C. section 360bbb-3(b)(1), unless the authorization is terminated or revoked.  Performed at [facility], [place] [address], [place], [place] [address]   MRSA Next Gen by PCR, Nasal     Status: None   Collection Time: 08/09/20  1:14 AM   Specimen: Nasal Mucosa; Nasal Swab  Result Value Ref Range Status   MRSA by PCR Next Gen NOT DETECTED NOT DETECTED Final    Comment: (NOTE) The GeneXpert MRSA Assay (FDA approved for NASAL specimens only), is one component of a comprehensive MRSA colonization surveillance program. It is not intended to diagnose MRSA infection nor to guide or monitor treatment for MRSA infections. Test performance is not FDA approved in patients less than 56 years old. Performed at [facility], [place] [address], [place], [place] [address]   Blood Culture (routine x 2)     Status: None (Preliminary result)   Collection Time: 08/09/20  2:21 AM   Specimen: BLOOD  Result Value Ref Range Status   Specimen Description   Final    BLOOD BLOOD RIGHT HAND Performed at [facility] [address] [place], [place] [address]    Special Requests   Final    BOTTLES DRAWN AEROBIC AND ANAEROBIC Blood Culture adequate volume Performed at [facility] [address], [place], [place] [address]    Culture   Final    NO GROWTH < 12 HOURS Performed at [facility] [address] [place], [place] [address]    Report Status PENDING  Incomplete  C Difficile Quick Screen w PCR reflex     Status: Abnormal  Collection Time: 08/09/20  2:30 AM   Specimen: STOOL  Result Value Ref Range Status   C Diff antigen NON REACTIVE (A) NEGATIVE Final   C Diff toxin NON REACTIVE (A) NEGATIVE Final   C Diff interpretation NEGATIVE  Final    Comment: Performed at [facility], [place] [address], [place], [place] [address]         Radiology Studies: CT ABDOMEN PELVIS WO CONTRAST  Result Date: 08/08/2020 CLINICAL DATA:  Flank pain, kidney stone suspected new AKI, AMS EXAM: CT ABDOMEN AND PELVIS WITHOUT CONTRAST TECHNIQUE: Multidetector CT imaging of the abdomen and pelvis was performed following the standard protocol without IV contrast. COMPARISON:  July 23, 2020. FINDINGS: Evaluation is limited by lack of IV contrast. Lower chest: No acute abnormality. Hepatobiliary: Cholelithiasis within the mildly distended gallbladder, similar in comparison to prior. No pericholecystic fat stranding. Unremarkable noncontrast appearance of the liver. Pancreas: No peripancreatic fat stranding. Spleen: Unremarkable. Adrenals/Urinary Tract: Unchanged low-density LEFT adrenal nodule, likely a adrenal adenoma. RIGHT adrenal gland is unremarkable. No hydronephrosis. No nephrolithiasis. Bladder is decompressed, limiting evaluation. Stomach/Bowel: Colon is distended with fluid. Appendiceal candidate is normal; there is no ancillary evidence of appendicitis. No evidence of bowel obstruction. No pneumatosis or portal venous gas. Vascular/Lymphatic:  Minimal atherosclerotic calcifications of the aorta. No suspicious lymphadenopathy visualized. Reproductive: Unchanged 8.4 cm LEFT adnexal cyst. Status post hysterectomy. Other: No free air.  Status post periumbilical hernia repair. Musculoskeletal: Remote LEFT-sided rib fractures. No acute osseous abnormality. IMPRESSION: 1. The colon is nondilated but is distended with fluid. This could reflect a nonspecific colitis. No definitive noncontrast evidence of bowel ischemia. 2. Stable appearance of a LEFT adnexal cyst for which a follow-up ultrasound in 6-12 months is recommended. 3. Similar appearance of cholelithiasis without evidence of cholecystitis. Electronically Signed   By: Valentino Saxon MD   On: 08/08/2020 18:51   CT Head Wo Contrast  Result Date: 08/08/2020 CLINICAL DATA:  BIB EMS from home, family reports that patient has been weak and lethargic since hospital discharge, over 5 days they have noticed bright red blood in stool. W/ EMS pt had pressure of 100/50 and then 90/40, then 80/40, pt refused EMS staring an IV. When EMS arrived pt had her BP pills in her hand, told EMS she would eat them, EMS removed her meds. Hx of psych issues. EXAM: CT HEAD WITHOUT CONTRAST TECHNIQUE: Contiguous axial images were obtained from the base of the skull through the vertex without intravenous contrast. COMPARISON:  07/22/2020 FINDINGS: Brain: No evidence of acute infarction, hemorrhage, hydrocephalus, extra-axial collection or mass lesion/mass effect. Patchy periventricular white matter hypoattenuation is noted consistent with mild chronic microvascular ischemic change, stable. Vascular: No hyperdense vessel or unexpected calcification. Skull: Normal. Negative for fracture or focal lesion. Sinuses/Orbits: Globes and orbits are unremarkable. Visualized sinuses are clear. Other: None. IMPRESSION: 1. No acute intracranial abnormalities. No change from the prior study. Electronically Signed   By: Lajean Manes M.D.    On: 08/08/2020 15:43   DG Chest Port 1 View  Result Date: 08/08/2020 CLINICAL DATA:  Sepsis. EXAM: PORTABLE CHEST 1 VIEW COMPARISON:  February 18, 2020. FINDINGS: Stable cardiomediastinal silhouette. Both lungs are clear. The visualized skeletal structures are unremarkable. IMPRESSION: No active disease. Electronically Signed   By: Marijo Conception M.D.   On: 08/08/2020 15:49        Scheduled Meds:  Asenapine Maleate  10 mg Sublingual BID   Chlorhexidine Gluconate Cloth  6 each Topical  Daily   escitalopram  10 mg Oral Daily   fluticasone furoate-vilanterol  1 puff Inhalation Daily   And   umeclidinium bromide  1 puff Inhalation Daily   montelukast  10 mg Oral QHS   pantoprazole  80 mg Oral Daily   potassium chloride  40 mEq Oral Once   prazosin  2 mg Oral QHS   prazosin  5 mg Oral BID   topiramate  25 mg Oral BID   Continuous Infusions:   sodium bicarbonate (isotonic) infusion in sterile water Stopped (08/09/20 1057)     LOS: 1 day    Time spent: 45 minutes spent on chart review, discussion with nursing staff, consultants, updating family and interview/physical exam; more than 50% of that time was spent in counseling and/or coordination of care.    Dilyn Osoria J [place] DO [facility] Available via Epic secure chat 7am-7pm After these hours, please refer to coverage provider listed on [email or web address] 08/09/2020, 12:32 PM

## 2020-08-09 NOTE — TOC Initial Note (Signed)
Transition of Care [facility]) - Initial/Assessment Note    Patient Details  Name: Tiffany [name] MRN: [id number] Date of Birth: 1961-01-06  Transition of Care [facility]) CM/SW Contact:    Leeroy Cha, RN Phone Number: 08/09/2020, 8:28 AM  Clinical Narrative:                 60 y.o. female with medical history significant of COPD on home O2, dCHF, HTN, CKD 3, anemia, prior substance abuse, cardiac arrest in 2018, anoxic brain injury, schizoaffective disorder.   Pt with recent admit earlier this month for N/V/D, metabolic derangements, AMS.  With rehydration and repletion of electrolytes her AMS and AKI resolved.  C.diff and GI pathogen pnl that admit were neg.  It was recommended that she go to SNF but pt declined SNF.   Today EMS called out to house for pt reportedly being weak and lethargic since hospital discharge.  Report by family to EMS of BRB in stool for past 5 days.   With EMS pt with low BPs: 100/50 and then 90/40, then 80/40, pt refused EMS staring an IV. When EMS arrived pt had her BP pills in her hand, told EMS she would eat them, EMS removed her meds.     ED Course: In the ED pt with SBP in 60s initially and AMS.  2L IVF bolus improved BPs to 884Z systolic.   UDS positive for benzos (pt takes valium), ? Benzo OD.   Pt with AKI: creat 3.8 and BUN 40 up from 1.2 and 13 on DC earlier this month.   Pt with NAG metabolic acidosis: bicarb 14, Cl 115.   Mg 1.3   WBC 2.9k but looks like pt always has mild leukopenia.   HGB 7.4 down from 8.4 on discharge 7/13   CXR neg, UA neg, COVID and flu neg.   Pt having diarrhea and CT A/P significant for diarrheal illness.   Interestingly however despite report of BRBPR and HGB drop: hemoccult is negative today.   Mentation has improved in ED: pt now states she is having diarrhea, also complains of painful "knots" on her R leg.   Pt does clearly have some intellectual slowing, presumably related to anoxic brain injury from prior  cardiac arrest?  TOC PLAN OF CARE: PT HAS [place] FOR PT AT HOME,I have asked the patient to be on the alert for new or increasing symptoms such as  and to call directly if such should occur. AND RESPONSIVE TO COMMANDS AND CONVERSATION.  Expected Discharge Plan: [place] Barriers to Discharge: Continued Medical Work up   Patient Goals and CMS Choice Patient states their goals for this hospitalization and ongoing recovery are:: to go home CMS Medicare.gov Compare Post Acute Care list provided to:: Patient    Expected Discharge Plan and Services Expected Discharge Plan: [place]   Discharge Planning Services: CM Consult Post Acute Care Choice: Resumption of Svcs/PTA Provider Living arrangements for the past 2 months: Apartment                                      Prior Living Arrangements/Services Living arrangements for the past 2 months: Apartment Lives with:: Self Patient language and need for interpreter reviewed:: Yes Do you feel safe going back to the place where you live?: Yes            Criminal  Activity/Legal Involvement Pertinent to Current Situation/Hospitalization: No - Comment as needed  Activities of Daily Living      Permission Sought/Granted Permission sought to share information with : Case Manager                Emotional Assessment Appearance:: Appears stated age Attitude/Demeanor/Rapport: Gracious Affect (typically observed): Accepting Orientation: : Oriented to Place, Oriented to Self, Oriented to  Time, Oriented to Situation Alcohol / Substance Use: Tobacco Use Psych Involvement: No (comment)  Admission diagnosis:  AKI (acute kidney injury) ([place]) [N17.9] Hypotension, unspecified hypotension type [I95.9] Altered mental status, unspecified altered mental status type [R41.82] Patient Active Problem List   Diagnosis Date Noted   Protein-calorie malnutrition, severe 05/15/2020   Malnutrition of  moderate degree 64/33/2951   Periumbilical abdominal pain    Dysphagia    Nausea vomiting and diarrhea    Generalized abdominal pain    E. coli UTI 88/41/6606   Acute metabolic encephalopathy 30/16/0109   Psychosis ([place]) 02/20/2020   Schizophrenia, schizo-affective ([place]) 32/35/5732   Metabolic acidosis, normal anion gap (NAG) 02/20/2020   Altered mental status    Hypocalcemia    AKI (acute kidney injury) ([place]) 02/18/2020   Pelvic mass in female 01/02/2020   Genital herpes 11/25/2019   Stage 2 skin ulcer of sacral region ([place]) 10/28/2019   ARF (acute renal failure) ([place]) 10/27/2019   Family discord 02/04/2019   Aggressive behavior    PTSD (post-traumatic stress disorder) 05/27/2018   Schizoaffective disorder, bipolar type ([place]) 04/05/2018   Closed displaced fracture of fifth metacarpal bone 03/21/2018   Difficulty with speech 01/24/2018   Encephalopathy 11/21/2017   Drug abuse ([place]) 11/21/2017   Frequent falls 10/11/2017   Binge eating disorder    Dependence on continuous supplemental oxygen 05/14/2017   Gout 04/11/2017   Hypomagnesemia    CKD (chronic kidney disease) stage 3, GFR 30-59 ml/min (HCC) 12/15/2016   Carotid artery stenosis    Hypokalemia    Osteoarthritis 10/26/2016   Anoxic brain injury ([place]) 09/08/2016   Overactive bladder 06/07/2016   QT prolongation    OSA and COPD overlap syndrome ([place])    Arthritis    Essential hypertension 03/22/2016   Cocaine use disorder, severe, in sustained remission ([place]) 12/17/2015   History of drug abuse in remission ([place]) 11/28/2015   Hyponatremia 11/25/2015   Chronic diastolic congestive heart failure (HCC)    Chronic respiratory failure with hypoxia ([place]) 06/22/2015   Tobacco use disorder 07/22/2014   COPD (chronic obstructive pulmonary disease) ([place]) 07/08/2014   Seizure ([place]) 01/04/2013   Chronic pain syndrome 06/18/2012   Dyslipidemia 04/24/2011   Anemia 04/24/2011   Diabetic neuropathy ([place]) 04/24/2011   Morbid  obesity ([place]) 10/18/2010   Type 2 diabetes mellitus ([place]) 10/18/2010   PCP:  [name], MD Pharmacy:   [facility] [place], [place] - [address] [place] [address] Phone: [phone] Fax: [place], [place] - [address] AT [facility] [place] [address] Phone: [phone] Fax: [phone]     Social Determinants of Health (SDOH) Interventions    Readmission Risk Interventions Readmission Risk Prevention Plan 07/26/2020 02/23/2020  Transportation Screening Complete Complete  Medication Review (RN Care Manager) Complete Complete  PCP or Specialist appointment within 3-5 days of discharge Complete Complete  HRI or Home Care Consult Complete Complete  SW Recovery Care/Counseling Consult  Complete Complete  Palliative Care Screening Not Applicable Complete  Skilled Nursing Facility Complete Complete  Some recent data might be hidden

## 2020-08-09 NOTE — Consult Note (Signed)
[place] Nurse wound consult note Consultation was completed by review of records, images and assistance from the bedside nurse/clinical staff.  Reason for Consult: sacrum History of pressure injury left heel as well  Wound type: Stage 2 pressure injury x 2 over sacral region Open wound left heel Pressure Injury POA: Yes Measurement: Medial right sacral region: 1cm x 3cm x 0cm  Buttock left:2cm x 1cm x 0cm  Left heel: 2cm x 1cm x 0cm  Wound bed:all pink, clean per nursing flowsheet Drainage (amount, consistency, odor) none Periwound: intact  Dressing procedure/placement/frequency:  Continue silicone foam to each area per skin care order set. Add Prevalon boot left heel for offloading.   Discussed POC with bedside nurse per [facility] Re consult if needed, will not follow at this time. Thanks  Aariona Momon [employer], RN,CWOCN, CNS, [place] [phone])

## 2020-08-10 ENCOUNTER — Encounter (HOSPITAL_COMMUNITY): Payer: Self-pay | Admitting: Psychiatry

## 2020-08-10 DIAGNOSIS — N179 Acute kidney failure, unspecified: Secondary | ICD-10-CM | POA: Diagnosis not present

## 2020-08-10 LAB — GASTROINTESTINAL PANEL BY PCR, STOOL (REPLACES STOOL CULTURE)

## 2020-08-10 LAB — BASIC METABOLIC PANEL
Anion gap: 6 (ref 5–15)
BUN: 32 mg/dL — ABNORMAL HIGH (ref 8–23)
CO2: 28 mmol/L (ref 22–32)
Calcium: 7.8 mg/dL — ABNORMAL LOW (ref 8.9–10.3)
Chloride: 104 mmol/L (ref 98–111)
Creatinine, Ser: 2.17 mg/dL — ABNORMAL HIGH (ref 0.44–1.00)
GFR, Estimated: 25 mL/min — ABNORMAL LOW (ref >60)
Glucose, Bld: 75 mg/dL (ref 70–99)
Potassium: 3.6 mmol/L (ref 3.5–5.1)
Sodium: 138 mmol/L (ref 135–145)

## 2020-08-10 LAB — IRON AND TIBC
Iron: 28 ug/dL (ref 28–170)
Saturation Ratios: 17 % (ref 10.4–31.8)
TIBC: 162 ug/dL — ABNORMAL LOW (ref 250–450)
UIBC: 134 ug/dL

## 2020-08-10 LAB — VITAMIN D 25 HYDROXY (VIT D DEFICIENCY, FRACTURES): Vit D, 25-Hydroxy: 42.93 ng/mL (ref 30–100)

## 2020-08-10 LAB — GLUCOSE, CAPILLARY
Glucose-Capillary: 74 mg/dL (ref 70–99)
Glucose-Capillary: 75 mg/dL (ref 70–99)
Glucose-Capillary: 70 mg/dL (ref 70–99)
Glucose-Capillary: 79 mg/dL (ref 70–99)
Glucose-Capillary: 85 mg/dL (ref 70–99)
Glucose-Capillary: 96 mg/dL (ref 70–99)

## 2020-08-10 LAB — FERRITIN: Ferritin: 278 ng/mL (ref 11–307)

## 2020-08-10 LAB — MAGNESIUM
Magnesium: 1 mg/dL — ABNORMAL LOW (ref 1.7–2.4)
Magnesium: 2.5 mg/dL — ABNORMAL HIGH (ref 1.7–2.4)

## 2020-08-10 LAB — RETICULOCYTES
Immature Retic Fract: 17.8 % — ABNORMAL HIGH (ref 2.3–15.9)
RBC.: 2.9 MIL/uL — ABNORMAL LOW (ref 3.87–5.11)
Retic Count, Absolute: 24.1 10*3/uL (ref 19.0–186.0)
Retic Ct Pct: 0.8 % (ref 0.4–3.1)

## 2020-08-10 LAB — C-REACTIVE PROTEIN: CRP: 2 mg/dL — ABNORMAL HIGH (ref <1.0)

## 2020-08-10 LAB — SEDIMENTATION RATE: Sed Rate: 15 mm/hr (ref 0–22)

## 2020-08-10 LAB — VITAMIN B12: Vitamin B-12: 541 pg/mL (ref 180–914)

## 2020-08-10 LAB — FOLATE: Folate: 2.8 ng/mL — ABNORMAL LOW (ref >5.9)

## 2020-08-10 MED ORDER — CHOLESTYRAMINE LIGHT 4 G PO PACK
4.0000 g | PACK | Freq: Three times a day (TID) | ORAL | Status: DC
  Administered 2020-08-10 – 2020-08-16 (×17): 4 g via ORAL
  Filled 2020-08-10 (×25): qty 1

## 2020-08-10 MED ORDER — MAGNESIUM SULFATE 4 GM/100ML IV SOLN
4.0000 g | Freq: Once | INTRAVENOUS | Status: AC
  Administered 2020-08-10: 4 g via INTRAVENOUS
  Filled 2020-08-10: qty 100

## 2020-08-10 MED ORDER — ADULT MULTIVITAMIN W/MINERALS CH
1.0000 | ORAL_TABLET | Freq: Every day | ORAL | Status: DC
  Administered 2020-08-10 – 2020-08-16 (×7): 1 via ORAL
  Filled 2020-08-10 (×7): qty 1

## 2020-08-10 MED ORDER — GABAPENTIN 300 MG PO CAPS
300.0000 mg | ORAL_CAPSULE | Freq: Once | ORAL | Status: AC
  Administered 2020-08-10: 300 mg via ORAL
  Filled 2020-08-10: qty 1

## 2020-08-10 MED ORDER — SACCHAROMYCES BOULARDII 250 MG PO CAPS
250.0000 mg | ORAL_CAPSULE | Freq: Two times a day (BID) | ORAL | Status: DC
  Administered 2020-08-11 – 2020-08-16 (×11): 250 mg via ORAL
  Filled 2020-08-10 (×12): qty 1

## 2020-08-10 MED ORDER — BOOST / RESOURCE BREEZE PO LIQD CUSTOM
1.0000 | Freq: Three times a day (TID) | ORAL | Status: DC
  Administered 2020-08-14: 1 via ORAL

## 2020-08-10 MED ORDER — PROSOURCE PLUS PO LIQD
30.0000 mL | Freq: Two times a day (BID) | ORAL | Status: DC
  Filled 2020-08-10 (×4): qty 30

## 2020-08-10 MED ORDER — FOLIC ACID 1 MG PO TABS
1.0000 mg | ORAL_TABLET | Freq: Every day | ORAL | Status: DC
  Administered 2020-08-10 – 2020-08-16 (×7): 1 mg via ORAL
  Filled 2020-08-10 (×8): qty 1

## 2020-08-10 NOTE — Progress Notes (Signed)
Patient refusing boost, Immodium, Prosource, Prevalite and Florastor.Explained impportance of protein supplements and meds to patient. Dr [place] notified.

## 2020-08-10 NOTE — Progress Notes (Signed)
Mrs Mankin refused PT permanently, she was upset about PT even going into her room, as she told OT this am she does not wish to pursue therapy. Mrs Doolen told PT not to return and that she would tell her doctor. Dr [place] notified.

## 2020-08-10 NOTE — Progress Notes (Signed)
Patient refused this nurse from providing her care, allowed nursing to clean her up after bowel movement. Refused to take medications, requesting another nurse. Will allow another nurse to reattempt 1600 medication administration at a later time.

## 2020-08-10 NOTE — Progress Notes (Signed)
OT Cancellation Note  Patient Details Name: Tiffany [name] MRN: [id number] DOB: [birth year]   Cancelled Treatment:    Reason Eval/Treat Not Completed: Patient declined, no reason specified: Pt strongly encouraged to participate with Occupational therapy Evaluation, however pt adamantly refusing. Pt stated that she has not been up from her couch since returning home and her [place] comes 7x/week and helps pt get cleaned up. Pt stated "Maybe tomorrow." For therapy.  Julien Girt 08/10/2020, 11:23 AM

## 2020-08-10 NOTE — Progress Notes (Signed)
Initial Nutrition Assessment  DOCUMENTATION CODES:   Severe malnutrition in context of chronic illness  INTERVENTION:  - will order Boost Breeze TID, each supplement provides 250 kcal and 9 grams of protein. - will order 30 ml Prosource Plus BID, each supplement provides 100 kcal and 15 grams protein.  - will order 1 tablet multivitamin with minerals/day. - diet advancement as medically feasible. - recommend probiotic be ordered.   - recommend consideration of PEG vs J-tube for severely malnourished patient who continues to lose weight and who has had recurrent admissions d/t GI symptoms and findings severely limiting ability to take PO nutrition.    NUTRITION DIAGNOSIS:   Severe Malnutrition related to chronic illness as evidenced by moderate fat depletion, moderate muscle depletion, severe muscle depletion.  GOAL:   Patient will meet greater than or equal to 90% of their needs  MONITOR:   PO intake, Supplement acceptance, Diet advancement, Labs, Weight trends, Skin  REASON FOR ASSESSMENT:   Malnutrition Screening Tool  ASSESSMENT:   60 year old female with medical history of COPD, CHF, HTN, stage 3 CKD, anemia of chronic disease, hx of substance abuse, cardiac arrest 2018 with subsequent anoxic brain injury, and schizoaffective disorder. She presented to the ED via EMS d/t weakness and lethargy. Family reported BRBPR. She was admitted earlier this month d/t N/V/D with metabolic derangements and AMS. She was to d/c to SNF after previous admission, but refused.  Diet advanced from NPO to CLD on 7/24 at [facility]; no intakes documented since that time.   Patient laying in bed with female family member at bedside. Patient reports that she has not had anything PO today. Lunch tray delivered during RD visit.   She reports ongoing nausea that has been persistent for several months. She has not vomited today but was experiencing vomiting PTA. She has had dry heaving today. PRN zofran  earlier today was not helpful for her.  She reports that she was previously taking probiotic and that this provided some relief of GI symptoms and that since she stopped taking it she has noticed a progressive decline in GI comfort.   Patient was seen by RDs at [facility] in April and at that time required placement of Cortrak for TF d/t prominent esophageal dysmotility.   Patient reports being non-ambulatory. [name] able to hold a cup and utensils to feed herself some day and other days she is unable.  Weight today is 116 lb and weight on 5/20 was 125 lb. This indicates 9 lb weight loss (7.2% body weight) in the past 2 months; significant for time frame.   Per notes: - acute metabolic encephalopathy--improving - AKI on stage 3 CKD - viral gastroenteritis vs IBS vs microscopic colitis vs IBD - hypokalemia and hypomagnesemia--repletion ordered  - urinary retention   Labs reviewed; CNGs: 75, 70, 79 mg/dl, BUN: 32 mg/dl, creatinine: 2.17 mg/dl, Ca: 7.8 mg/dl, Mg: 1 mg/dl, GFR: 25 ml/min. Medications reviewed; 1 mg folvite/day, 4 g IV Mg sulfate x1 run 7/26, 80 mg oral protonix/day, 10 mEq IV KCl x4 runs 7/26. IVF; 150 mEq sodium bicarb in sterile water @ 125 ml/hr.    NUTRITION - FOCUSED PHYSICAL EXAM:  Flowsheet Row Most Recent Value  Orbital Region Moderate depletion  Upper Arm Region Severe depletion  Thoracic and Lumbar Region Unable to assess  Buccal Region Moderate depletion  Temple Region Severe depletion  Clavicle Bone Region Severe depletion  Clavicle and Acromion Bone Region Moderate depletion  Scapular Bone Region Unable to  assess  Dorsal Hand Mild depletion  Patellar Region Moderate depletion  Anterior Thigh Region Moderate depletion  Posterior Calf Region Unable to assess  Edema (RD Assessment) None  Hair Reviewed  Eyes Reviewed  Mouth Reviewed  Skin Reviewed  Nails Reviewed       Diet Order:   Diet Order             Diet clear liquid Room service  appropriate? Yes; Fluid consistency: Thin  Diet effective now                   EDUCATION NEEDS:   Not appropriate for education at this time  Skin:  Skin Assessment: Skin Integrity Issues: Skin Integrity Issues:: Stage II Stage II: bilateral buttocks  Last BM:  7/26 (type 7 x2)  Height:   Ht Readings from Last 1 Encounters:  07/23/20 5' 4.5" (1.638 m)    Weight:   Wt Readings from Last 1 Encounters:  08/10/20 52.8 kg      Estimated Nutritional Needs:  Kcal:  1600-1800 kcal Protein:  80-95 grams Fluid:  >/= 2.2 L/day     Jarome Matin, MS, RD, LDN, CNSC Inpatient Clinical Dietitian RD pager # available in [facility]  After hours/weekend pager # available in [facility]

## 2020-08-10 NOTE — Progress Notes (Addendum)
PROGRESS NOTE    Tiffany [name]  WGY:[id number] DOB: 1960-08-02 DOA: 08/08/2020 PCP: [name], MD    Brief Narrative:  Tiffany [name] is a 60 year old female with past medical history significant for COPD, chronic diastolic congestive heart failure, essential hypertension, CKD stage IIIa, anemia of chronic medical disease, history of substance abuse, history of cardiac arrest 2018 with subsequent anoxic brain injury, schizoaffective disorder who presented to [facility] ED via EMS with weakness and lethargy.  Also family reported bright red blood in her stool.  On EMS arrival, they reported hypotension with BP 80/40.   Patient with recent admission earlier this month for nausea/vomiting/diarrhea with associated metabolic derangements and altered mental status with improvement after IV fluid hydration and repletion of her electrolytes.  C. difficile PCR and GI pathogen panel were negative during that admission.  It was recommended that she go to SNF but she declined during that admission.  In the ED, BP 62/30, temperature 97.9 F, HR 71, RR 14, SPO2 99% on room air.  Sodium 136, potassium 3.6, chloride 115, CO2 14, BUN 40, creatinine 3.83 with baseline 1.2-1.3, magnesium 1.3, AST 13, ALT 8, total bilirubin 0.4.  Lactic acid 1.1.  WBC 2.9, hemoglobin 7.4, platelets 308.  Tylenol level less than 10, salicylate level less than 7.0.  TSH 0.765.  FOBT negative.  EtOH level less than 10.  CT head without contrast with no acute intracranial process.  Chest x-ray with no acute cardiopulmonary disease process.  Hospital service was consulted for further evaluation and management of altered mental status, hypotension, acute on chronic renal failure and diarrhea.   Assessment & Plan:   Principal Problem:   AKI (acute kidney injury) ([place]) Active Problems:   Type 2 diabetes mellitus (HCC)   Anemia   Chronic respiratory failure with hypoxia (HCC)   History of drug abuse in remission [facility])   Essential  hypertension   Stage 2 skin ulcer of sacral region [facility])   Pelvic mass in female   Acute metabolic encephalopathy   Schizophrenia, schizo-affective (HCC)   Metabolic acidosis, normal anion gap (NAG)   Acute metabolic encephalopathy, POA; improving Patient presenting to the ED via EMS with confusion.  Patient is afebrile without leukocytosis.  Chest x-ray with no acute cardiopulmonary disease process.  Urinalysis unrevealing.  Lactic acid 1.0.  Covid-19/influenza A/B PCR negative.  CT head with no acute intracranial finding. --Continue supportive care and treatment as below  AKI on CKD stage IIIb Non-anion gap metabolic acidosis, POA Viral gastroenteritis vs IBS vs microscopic colitis vs IBD Patient presenting with recurrent diarrhea with poor oral intake.  Baseline creatinine 1.2-1.3.  Creatinine on arrival 3.83.  Patient is afebrile without leukocytosis.  CT abdomen/pelvis with: Nondilated but distended with fluid likely consistent with nonspecific colitis.  Etiology likely volume depletion in the setting of profuse diarrhea.  C. difficile PCR and GI PCR panel negative.  Suspect irritable bowel syndrome versus microscopic colitis versus inflammatory bowel disease. --Cr 3.83>3.49>2.17 --Continue IVF with sodium bicarbonate at 125 mL/h -- Loperamide prn and cholestyramine TID --check fecal calprotectin/lactoferrin, ESR/CRP, TIBC, folate, iron, B12, 25-hydroxy vitamin D level --Avoid nephrotoxins, renally dose all medications --Monitor BMP daily  Hypokalemia Hypomagnesemia Etiology likely secondary to profuse diarrhea.  Replete potassium and magnesium today. --Follow electrolytes closely daily and replete as needed  Hypotension Hx of essential hypertension On amlodipine 5 mg p.o. daily and furosemide 80 mg p.o. daily at home.  BP 62/30 on arrival.  Received IV fluid hydration. --BP much  improved, 126/81 this morning. --Continue to hold home antihypertensives --Hydralazine 61m PO q6h  prn SBP >180 or DBP >110 --Continue to closely monitor BP  Urinary retention RN reports poor urine output today, bladder scan with 411 mL of retained urine. --Required multiple in and out catheterizations, Foley catheter placed 7/25 --Hopefully when more mobile, will attempt voiding trial  Chronic diastolic congestive heart failure On furosemide 80 mg p.o. daily at baseline.  TTE 05/13/2020 with LVEF 55-60% with grade 1 diastolic dysfunction. --Continue to hold home furosemide given significant hypotension on arrival with associated poor oral intake and diarrhea. --Strict I's and O's and daily weights  Anemia of chronic medical disease Patient's family reported bright red blood per rectum.  FOBT x2 on admission negative.  Hemoglobin stable.  No signs of blood in stool reported by nursing staff since hospitalization. --Hgb 7.4>8.2>9.4>8.7, stable  COPD --Montelukast 10 mg p.o. daily --Trelegy Ellipta 1 puff daily --Albuterol neb as needed shortness of breath/wheezing  Schizoaffective disorder Depression/anxiety Home regimen includes Valium 116mPO TID prn anxiety, Topamax 2562mO BID, Asenapine 79m49m BID, Lexapro 10 mg p.o. daily, Prazosin 5 mg p.o. twice daily, 2 mg p.o. nightly --Continue to hold home medications except for Topamax given her encephalopathy as above  GERD: continue PPI  Left adnexal cyst Unchanged 8.4 cm left adnexal cyst on CT abdomen/pelvis on admission.  Recommend follow-up pelvic ultrasound in 6-12 months for further surveillance.  Weakness/deconditioning/debility: Patient recently hospitalized with recommendations of SNF on discharge, but she subsequently declined. --PT/OT evaluation  DVT prophylaxis: SCDs Start: 08/08/20 1939   Code Status: Full Code Family Communication: No family present at bedside this morning  Disposition Plan:  Level of care: Stepdown Status is: Inpatient  Remains inpatient appropriate because:Persistent severe electrolyte  disturbances, Ongoing diagnostic testing needed not appropriate for outpatient work up, Unsafe d/c plan, IV treatments appropriate due to intensity of illness or inability to take PO, and Inpatient level of care appropriate due to severity of illness  Dispo: The patient is from: Home              Anticipated d/c is to:  To be determined              Patient currently is not medically stable to d/c.   Difficult to place patient No   Consultants:  None  Procedures:  None  Antimicrobials:  None   Subjective: Patient seen examined bedside, resting comfortably.  Mental status improving.  Continues on IV fluid hydration with continued watery diarrhea.  GI PCR panel negative, will discontinue enteric precautions.  Suspect chronic diarrhea likely functional related to inflammatory bowel disease versus irritable bowel syndrome versus microscopic colitis.  No other complaints or concerns at this time.  No family present at bedside this morning.  Patient denies headache, no fever/chills/night sweats, no nausea/vomiting, no chest pain, no palpitations.  No acute events overnight per nursing staff.  Objective: Vitals:   08/10/20 0900 08/10/20 1000 08/10/20 1100 08/10/20 1132  BP: 122/73 122/81    Pulse: 91 87 81   Resp: _0 Temp:    98.2 F (36.8 C)  TempSrc:    Axillary  SpO2: 96% 98% 98%   Weight:        Intake/Output Summary (Last 24 hours) at 08/10/2020 1204 Last data filed at 08/10/2020 1000 Gross per 24 hour  Intake 2882.2 ml  Output 1100 ml  Net 1782.2 ml   Filed Weights   08/09/20 0100 08/10/20  0500  Weight: 52.6 kg 52.8 kg    Examination:  General exam: Appears calm and comfortable, ill in appearance Respiratory system: Clear to auscultation. Respiratory effort normal.  On room air Cardiovascular system: S1 & S2 heard, RRR. No JVD, murmurs, rubs, gallops or clicks. No pedal edema. Gastrointestinal system: Abdomen is nondistended, soft and mild tenderness to  palpation diffusely, no organomegaly or masses felt. Normal bowel sounds heard. Central nervous system: Alert and oriented. No focal neurological deficits. Extremities: Symmetric 5 x 5 power. Skin: No rashes, lesions or ulcers Psychiatry: Judgement and insight appear poor. Mood & affect appropriate.     Data Reviewed: I have personally reviewed following labs and imaging studies  CBC: Recent Labs  Lab 08/08/20 1605 08/08/20 2133 08/09/20 0221 08/09/20 1849  WBC 2.9*  --  3.6* 3.4*  NEUTROABS 1.5*  --   --   --   HGB 7.4* 8.2* 9.4* 8.7*  HCT 24.2* 27.0* 31.2* 27.3*  MCV 94.9  --  95.4 90.7  PLT 308  --  359 269   Basic Metabolic Panel: Recent Labs  Lab 08/08/20 1605 08/09/20 0221 08/09/20 1849 08/10/20 0655  NA 136 137 135 138  K 3.6 3.5 3.0* 3.6  CL 115* 109 103 104  CO2 14* 16* 23 28  GLUCOSE 82 68* 79 75  BUN 40* 39* 33* 32*  CREATININE 3.83* 3.49* 2.60* 2.17*  CALCIUM 7.9* 8.4* 8.0* 7.8*  MG 1.3*  --  1.3* 1.0*   GFR: Estimated Creatinine Clearance: 22.7 mL/min (A) (by C-G formula based on SCr of 2.17 mg/dL (H)). Liver Function Tests: Recent Labs  Lab 08/08/20 1605 08/09/20 0221 08/09/20 1849  AST 13* 16 16  ALT _0 ALKPHOS 118 134* 119  BILITOT 0.4 0.5 0.5  PROT 5.0* 5.7* 5.3*  ALBUMIN 2.7* 3.1* 2.7*   No results for input(s): LIPASE, AMYLASE in the last 168 hours. Recent Labs  Lab 08/08/20 1606  AMMONIA 15   Coagulation Profile: Recent Labs  Lab 08/08/20 1605  INR 1.4*   Cardiac Enzymes: Recent Labs  Lab 08/08/20 1605  CKTOTAL 81   BNP (last 3 results) Recent Labs    06/04/20 1140 06/15/20 1636 07/13/20 1310  PROBNP 11,232* 2,313* 201   HbA1C: No results for input(s): HGBA1C in the last 72 hours. CBG: Recent Labs  Lab 08/09/20 2122 08/09/20 2329 08/10/20 0416 08/10/20 0755 08/10/20 1124  GLUCAP 75 74 75 70 79   Lipid Profile: No results for input(s): CHOL, HDL, LDLCALC, TRIG, CHOLHDL, LDLDIRECT in the last 72  hours. Thyroid Function Tests: Recent Labs    08/08/20 1606  TSH 0.765   Anemia Panel: No results for input(s): VITAMINB12, FOLATE, FERRITIN, TIBC, IRON, RETICCTPCT in the last 72 hours. Sepsis Labs: Recent Labs  Lab 08/08/20 1605 08/08/20 1851 08/09/20 1849  LATICACIDVEN 1.1 1.0 0.8    Recent Results (from the past 240 hour(s))  Blood Culture (routine x 2)     Status: None (Preliminary result)   Collection Time: 08/08/20  4:06 PM   Specimen: BLOOD  Result Value Ref Range Status   Specimen Description   Final    BLOOD LEFT ANTECUBITAL Performed at [facility], [place] [address] [place], [place] [address]    Special Requests   Final    BOTTLES DRAWN AEROBIC AND ANAEROBIC Blood Culture results may not be optimal due to an inadequate volume of blood received in culture bottles Performed at [place]  [address], [place], [place] [address]    Culture   Final    NO GROWTH 2 DAYS Performed at [place] [address] [place], [place] [address]    Report Status PENDING  Incomplete  Resp Panel by RT-PCR (Flu A&B, Covid) Nasopharyngeal Swab     Status: None   Collection Time: 08/08/20  6:04 PM   Specimen: Nasopharyngeal Swab; Nasopharyngeal(NP) swabs in vial transport medium  Result Value Ref Range Status   SARS Coronavirus 2 by RT PCR NEGATIVE NEGATIVE Final    Comment: (NOTE) SARS-CoV-2 target nucleic acids are NOT DETECTED.  The SARS-CoV-2 RNA is generally detectable in upper respiratory specimens during the acute phase of infection. The lowest concentration of SARS-CoV-2 viral copies this assay can detect is 138 copies/mL. A negative result does not preclude SARS-Cov-2 infection and should not be used as the sole basis for treatment or other patient management decisions. A negative result may occur with  improper specimen collection/handling, submission of specimen other than nasopharyngeal swab, presence of viral  mutation(s) within the areas targeted by this assay, and inadequate number of viral copies(<138 copies/mL). A negative result must be combined with clinical observations, patient history, and epidemiological information. The expected result is Negative.  Fact Sheet for Patients:  EntrepreneurPulse.com.au  Fact Sheet for Healthcare Providers:  IncredibleEmployment.be  This test is no t yet approved or cleared by the [place] FDA and  has been authorized for detection and/or diagnosis of SARS-CoV-2 by FDA under an Emergency Use Authorization (EUA). This EUA will remain  in effect (meaning this test can be used) for the duration of the COVID-19 declaration under Section 564(b)(1) of the Act, 21 U.S.C.section 360bbb-3(b)(1), unless the authorization is terminated  or revoked sooner.       Influenza A by PCR NEGATIVE NEGATIVE Final   Influenza B by PCR NEGATIVE NEGATIVE Final    Comment: (NOTE) The Xpert Xpress SARS-CoV-2/FLU/RSV plus assay is intended as an aid in the diagnosis of influenza from Nasopharyngeal swab specimens and should not be used as a sole basis for treatment. Nasal washings and aspirates are unacceptable for Xpert Xpress SARS-CoV-2/FLU/RSV testing.  Fact Sheet for Patients: EntrepreneurPulse.com.au  Fact Sheet for Healthcare Providers: IncredibleEmployment.be  This test is not yet approved or cleared by the [place] FDA and has been authorized for detection and/or diagnosis of SARS-CoV-2 by FDA under an Emergency Use Authorization (EUA). This EUA will remain in effect (meaning this test can be used) for the duration of the COVID-19 declaration under Section 564(b)(1) of the Act, 21 U.S.C. section 360bbb-3(b)(1), unless the authorization is terminated or revoked.  Performed at [facility], [place] [address], [place], [place] [address]   MRSA Next Gen by PCR,  Nasal     Status: None   Collection Time: 08/09/20  1:14 AM   Specimen: Nasal Mucosa; Nasal Swab  Result Value Ref Range Status   MRSA by PCR Next Gen NOT DETECTED NOT DETECTED Final    Comment: (NOTE) The GeneXpert MRSA Assay (FDA approved for NASAL specimens only), is one component of a comprehensive MRSA colonization surveillance program. It is not intended to diagnose MRSA infection nor to guide or monitor treatment for MRSA infections. Test performance is not FDA approved in patients less than 10 years old. Performed at [facility], [place] [address], [place], [place] [address]   Blood Culture (routine x 2)     Status: None (Preliminary result)   Collection Time:  08/09/20  2:21 AM   Specimen: BLOOD  Result Value Ref Range Status   Specimen Description   Final    BLOOD BLOOD RIGHT HAND Performed at [facility] [address], [place], [place] [address]    Special Requests   Final    BOTTLES DRAWN AEROBIC AND ANAEROBIC Blood Culture adequate volume Performed at [place] [address] [place], [place] [address]    Culture   Final    NO GROWTH 1 DAY Performed at [facility], [place] [name], [place], [place] [address]    Report Status PENDING  Incomplete  C Difficile Quick Screen w PCR reflex     Status: Abnormal   Collection Time: 08/09/20  2:30 AM   Specimen: STOOL  Result Value Ref Range Status   C Diff antigen NON REACTIVE (A) NEGATIVE Final   C Diff toxin NON REACTIVE (A) NEGATIVE Final   C Diff interpretation NEGATIVE  Final    Comment: Performed at [facility], [place] [address] [place], [name] [address]  Gastrointestinal Panel by PCR , Stool     Status: None   Collection Time: 08/09/20 11:08 AM   Specimen: Stool  Result Value Ref Range Status   Campylobacter species NOT DETECTED NOT DETECTED Final   Plesimonas shigelloides NOT DETECTED NOT DETECTED Final   Salmonella species  NOT DETECTED NOT DETECTED Final   Yersinia enterocolitica NOT DETECTED NOT DETECTED Final   Vibrio species NOT DETECTED NOT DETECTED Final   Vibrio cholerae NOT DETECTED NOT DETECTED Final   Enteroaggregative E coli (EAEC) NOT DETECTED NOT DETECTED Final   Enteropathogenic E coli (EPEC) NOT DETECTED NOT DETECTED Final   Enterotoxigenic E coli (ETEC) NOT DETECTED NOT DETECTED Final   Shiga like toxin producing E coli (STEC) NOT DETECTED NOT DETECTED Final   Shigella/Enteroinvasive E coli (EIEC) NOT DETECTED NOT DETECTED Final   Cryptosporidium NOT DETECTED NOT DETECTED Final   Cyclospora cayetanensis NOT DETECTED NOT DETECTED Final   Entamoeba histolytica NOT DETECTED NOT DETECTED Final   Giardia lamblia NOT DETECTED NOT DETECTED Final   Adenovirus F40/41 NOT DETECTED NOT DETECTED Final   Astrovirus NOT DETECTED NOT DETECTED Final   Norovirus GI/GII NOT DETECTED NOT DETECTED Final   Rotavirus A NOT DETECTED NOT DETECTED Final   Sapovirus (I, II, IV, and V) NOT DETECTED NOT DETECTED Final    Comment: Performed at [facility], [place] [place], [place] [address]         Radiology Studies: CT ABDOMEN PELVIS WO CONTRAST  Result Date: 08/08/2020 CLINICAL DATA:  Flank pain, kidney stone suspected new AKI, AMS EXAM: CT ABDOMEN AND PELVIS WITHOUT CONTRAST TECHNIQUE: Multidetector CT imaging of the abdomen and pelvis was performed following the standard protocol without IV contrast. COMPARISON:  July 23, 2020. FINDINGS: Evaluation is limited by lack of IV contrast. Lower chest: No acute abnormality. Hepatobiliary: Cholelithiasis within the mildly distended gallbladder, similar in comparison to prior. No pericholecystic fat stranding. Unremarkable noncontrast appearance of the liver. Pancreas: No peripancreatic fat stranding. Spleen: Unremarkable. Adrenals/Urinary Tract: Unchanged low-density LEFT adrenal nodule, likely a adrenal adenoma. RIGHT adrenal gland is unremarkable. No  hydronephrosis. No nephrolithiasis. Bladder is decompressed, limiting evaluation. Stomach/Bowel: Colon is distended with fluid. Appendiceal candidate is normal; there is no ancillary evidence of appendicitis. No evidence of bowel obstruction. No pneumatosis or portal venous gas. Vascular/Lymphatic: Minimal atherosclerotic calcifications of the aorta. No suspicious lymphadenopathy visualized. Reproductive: Unchanged 8.4 cm  LEFT adnexal cyst. Status post hysterectomy. Other: No free air.  Status post periumbilical hernia repair. Musculoskeletal: Remote LEFT-sided rib fractures. No acute osseous abnormality. IMPRESSION: 1. The colon is nondilated but is distended with fluid. This could reflect a nonspecific colitis. No definitive noncontrast evidence of bowel ischemia. 2. Stable appearance of a LEFT adnexal cyst for which a follow-up ultrasound in 6-12 months is recommended. 3. Similar appearance of cholelithiasis without evidence of cholecystitis. Electronically Signed   By: Valentino Saxon MD   On: 08/08/2020 18:51   CT Head Wo Contrast  Result Date: 08/08/2020 CLINICAL DATA:  BIB EMS from home, family reports that patient has been weak and lethargic since hospital discharge, over 5 days they have noticed bright red blood in stool. W/ EMS pt had pressure of 100/50 and then 90/40, then 80/40, pt refused EMS staring an IV. When EMS arrived pt had her BP pills in her hand, told EMS she would eat them, EMS removed her meds. Hx of psych issues. EXAM: CT HEAD WITHOUT CONTRAST TECHNIQUE: Contiguous axial images were obtained from the base of the skull through the vertex without intravenous contrast. COMPARISON:  07/22/2020 FINDINGS: Brain: No evidence of acute infarction, hemorrhage, hydrocephalus, extra-axial collection or mass lesion/mass effect. Patchy periventricular white matter hypoattenuation is noted consistent with mild chronic microvascular ischemic change, stable. Vascular: No hyperdense vessel or  unexpected calcification. Skull: Normal. Negative for fracture or focal lesion. Sinuses/Orbits: Globes and orbits are unremarkable. Visualized sinuses are clear. Other: None. IMPRESSION: 1. No acute intracranial abnormalities. No change from the prior study. Electronically Signed   By: Lajean Manes M.D.   On: 08/08/2020 15:43   DG Chest Port 1 View  Result Date: 08/08/2020 CLINICAL DATA:  Sepsis. EXAM: PORTABLE CHEST 1 VIEW COMPARISON:  February 18, 2020. FINDINGS: Stable cardiomediastinal silhouette. Both lungs are clear. The visualized skeletal structures are unremarkable. IMPRESSION: No active disease. Electronically Signed   By: Marijo Conception M.D.   On: 08/08/2020 15:49        Scheduled Meds:  Chlorhexidine Gluconate Cloth  6 each Topical Daily   cholestyramine light  4 g Oral TID   escitalopram  10 mg Oral Daily   fluticasone furoate-vilanterol  1 puff Inhalation Daily   And   umeclidinium bromide  1 puff Inhalation Daily   montelukast  10 mg Oral QHS   naLOXone (NARCAN)  injection  0.4 mg Intravenous Once   pantoprazole  80 mg Oral Daily   topiramate  25 mg Oral BID   Continuous Infusions:  magnesium sulfate bolus IVPB      sodium bicarbonate (isotonic) infusion in sterile water 125 mL/hr at 08/10/20 1000     LOS: 2 days    Time spent: 45 minutes spent on chart review, discussion with nursing staff, consultants, updating family and interview/physical exam; more than 50% of that time was spent in counseling and/or coordination of care.    Libi Corso J [place] DO [facility] Available via Epic secure chat 7am-7pm After these hours, please refer to coverage provider listed on [email or web address] 08/10/2020, 12:04 PM

## 2020-08-10 NOTE — Progress Notes (Signed)
PT Cancellation Note  Patient Details Name: Tiffany [name] MRN: [id number] DOB: Aug 26, 1960   Cancelled Treatment:    Reason Eval/Treat Not Completed: Patient declined, no reason specified Pt appears angry that PT came back to check on her this afternoon (refused OT this morning).  Pt states she's tired and her legs hurt and she will tell the doctor, "no."  Pt adamantly refuses therapy at this time and has been self limiting to only sitting EOB during previous hospitalizations.  SNF was recommended last admission.  Pt notified to request therapy if so desired later this admission, however pt stopped talking to therapist.  Discussed with RN that PT will sign off at this time.   Debara Kamphuis,KATHrine E 08/10/2020, 3:15 PM Arlyce Dice, DPT Acute Rehabilitation Services Pager: [phone] Office: [phone]

## 2020-08-11 ENCOUNTER — Ambulatory Visit: Payer: 59 | Attending: Family Medicine | Admitting: Family Medicine

## 2020-08-11 DIAGNOSIS — N179 Acute kidney failure, unspecified: Secondary | ICD-10-CM | POA: Diagnosis not present

## 2020-08-11 LAB — COMPREHENSIVE METABOLIC PANEL
ALT: 9 U/L (ref 0–44)
AST: 16 U/L (ref 15–41)
Albumin: 2.4 g/dL — ABNORMAL LOW (ref 3.5–5.0)
Alkaline Phosphatase: 102 U/L (ref 38–126)
Anion gap: 8 (ref 5–15)
BUN: 22 mg/dL (ref 8–23)
CO2: 35 mmol/L — ABNORMAL HIGH (ref 22–32)
Calcium: 7.9 mg/dL — ABNORMAL LOW (ref 8.9–10.3)
Chloride: 96 mmol/L — ABNORMAL LOW (ref 98–111)
Creatinine, Ser: 1.37 mg/dL — ABNORMAL HIGH (ref 0.44–1.00)
GFR, Estimated: 44 mL/min — ABNORMAL LOW (ref >60)
Glucose, Bld: 86 mg/dL (ref 70–99)
Potassium: 3 mmol/L — ABNORMAL LOW (ref 3.5–5.1)
Sodium: 139 mmol/L (ref 135–145)
Total Bilirubin: 0.3 mg/dL (ref 0.3–1.2)
Total Protein: 4.2 g/dL — ABNORMAL LOW (ref 6.5–8.1)

## 2020-08-11 LAB — LACTOFERRIN, FECAL, QUALITATIVE: Lactoferrin, Fecal, Qual: POSITIVE — AB

## 2020-08-11 LAB — MAGNESIUM: Magnesium: 2.2 mg/dL (ref 1.7–2.4)

## 2020-08-11 MED ORDER — POTASSIUM CHLORIDE CRYS ER 20 MEQ PO TBCR
40.0000 meq | EXTENDED_RELEASE_TABLET | Freq: Two times a day (BID) | ORAL | Status: AC
  Administered 2020-08-11 (×2): 40 meq via ORAL
  Filled 2020-08-11 (×2): qty 2

## 2020-08-11 NOTE — Progress Notes (Signed)
PROGRESS NOTE    Tiffany [name]  NKN:[id number] DOB: 11-Apr-1960 DOA: 08/08/2020 PCP: [name], MD    Brief Narrative:  Tiffany [name] is a 60 year old female with past medical history significant for COPD, chronic diastolic congestive heart failure, essential hypertension, CKD stage IIIa, anemia of chronic medical disease, history of substance abuse, history of cardiac arrest 2018 with subsequent anoxic brain injury, schizoaffective disorder who presented to [facility] ED via EMS with weakness and lethargy.  Also family reported bright red blood in her stool.  On EMS arrival, they reported hypotension with BP 80/40.   Patient with recent admission earlier this month for nausea/vomiting/diarrhea with associated metabolic derangements and altered mental status with improvement after IV fluid hydration and repletion of her electrolytes.  C. difficile PCR and GI pathogen panel were negative during that admission.  It was recommended that she go to SNF but she declined during that admission.  In the ED, BP 62/30, temperature 97.9 F, HR 71, RR 14, SPO2 99% on room air.  Sodium 136, potassium 3.6, chloride 115, CO2 14, BUN 40, creatinine 3.83 with baseline 1.2-1.3, magnesium 1.3, AST 13, ALT 8, total bilirubin 0.4.  Lactic acid 1.1.  WBC 2.9, hemoglobin 7.4, platelets 308.  Tylenol level less than 10, salicylate level less than 7.0.  TSH 0.765.  FOBT negative.  EtOH level less than 10.  CT head without contrast with no acute intracranial process.  Chest x-ray with no acute cardiopulmonary disease process.  Hospital service was consulted for further evaluation and management of altered mental status, hypotension, acute on chronic renal failure and diarrhea.   Assessment & Plan:   Principal Problem:   AKI (acute kidney injury) ([place]) Active Problems:   Type 2 diabetes mellitus (HCC)   Anemia   Chronic respiratory failure with hypoxia (HCC)   History of drug abuse in remission [facility])   Essential  hypertension   Stage 2 skin ulcer of sacral region [facility])   Pelvic mass in female   Acute metabolic encephalopathy   Schizophrenia, schizo-affective (HCC)   Metabolic acidosis, normal anion gap (NAG)   Acute metabolic encephalopathy, POA; improving Patient presenting to the ED via EMS with confusion.  Patient is afebrile without leukocytosis.  Chest x-ray with no acute cardiopulmonary disease process.  Urinalysis unrevealing.  Lactic acid 1.0.  Covid-19/influenza A/B PCR negative.  CT head with no acute intracranial finding. --Continue supportive care and treatment as below  AKI on CKD stage IIIb Non-anion gap metabolic acidosis, POA Viral gastroenteritis vs IBS vs microscopic colitis vs IBD Patient presenting with recurrent diarrhea with poor oral intake.  Baseline creatinine 1.2-1.3.  Creatinine on arrival 3.83.  Patient is afebrile without leukocytosis.  CT abdomen/pelvis with: Nondilated but distended with fluid likely consistent with nonspecific colitis.  Etiology likely volume depletion in the setting of profuse diarrhea.  C. difficile PCR and GI PCR panel negative.  Suspect irritable bowel syndrome versus microscopic colitis versus inflammatory bowel disease.  ESR/CRP unrevealing. --Cr 3.83>3.49>2.17>1.37 --Discontinue sodium bicarb drip today --Loperamide prn and cholestyramine TID --Florastor --fecal calprotectin/lactoferrin: pending --Avoid nephrotoxins, renally dose all medications --Advance diet today --Monitor BMP daily  Hypokalemia Hypomagnesemia Etiology likely secondary to profuse diarrhea.  Replete potassium today. --Follow electrolytes closely daily and replete as needed  Hypotension Hx of essential hypertension On amlodipine 5 mg p.o. daily and furosemide 80 mg p.o. daily at home.  BP 62/30 on arrival.  Received IV fluid hydration. --BP much improved, 119/72 this morning. --Continue to hold home  antihypertensives --Hydralazine 6m PO q6h prn SBP >180 or DBP  >110 --Continue to closely monitor BP  Acute urinary retention RN reports poor urine output today, bladder scan with 411 mL of retained urine. --Required multiple in and out catheterizations, Foley catheter placed 7/25 --Hopefully when more mobile, will attempt voiding trial  Chronic diastolic congestive heart failure On furosemide 80 mg p.o. daily at baseline.  TTE 05/13/2020 with LVEF 55-60% with grade 1 diastolic dysfunction. --Continue to hold home furosemide given significant hypotension on arrival with associated poor oral intake and diarrhea. --Strict I's and O's and daily weights  Anemia of chronic medical disease Patient's family reported bright red blood per rectum.  FOBT x2 on admission negative.  Hemoglobin stable.  No signs of blood in stool reported by nursing staff since hospitalization. --Hgb 7.4>8.2>9.4>8.7, stable  COPD --Montelukast 10 mg p.o. daily --Trelegy Ellipta 1 puff daily --Albuterol neb as needed shortness of breath/wheezing  Schizoaffective disorder Depression/anxiety Home regimen includes Valium 178mPO TID prn anxiety, Topamax 2590mO BID, Asenapine 64m66m BID, Lexapro 10 mg p.o. daily, Prazosin 5 mg p.o. twice daily, 2 mg p.o. nightly --Lexapro 10 mg p.o. daily --Topamax 25 mg p.o. twice daily --Continue to hold remainder of home medications  GERD: continue PPI  Left adnexal cyst Unchanged 8.4 cm left adnexal cyst on CT abdomen/pelvis on admission.  Recommend follow-up pelvic ultrasound in 6-12 months for further surveillance.  Weakness/deconditioning/debility: Patient recently hospitalized with recommendations of SNF on discharge, but she subsequently declined.  Patient refusing PT/OT evaluation and they have now signed off.  DVT prophylaxis: SCDs Start: 08/08/20 1939   Code Status: Full Code Family Communication: No family present at bedside this morning  Disposition Plan:  Level of care: Med-Surg Status is: Inpatient  Remains inpatient  appropriate because:Persistent severe electrolyte disturbances, Ongoing diagnostic testing needed not appropriate for outpatient work up, Unsafe d/c plan, IV treatments appropriate due to intensity of illness or inability to take PO, and Inpatient level of care appropriate due to severity of illness  Dispo: The patient is from: Home              Anticipated d/c is to:  To be determined              Patient currently is not medically stable to d/c.   Difficult to place patient No   Consultants:  None  Procedures:  None  Antimicrobials:  None   Subjective: Patient seen examined bedside, resting comfortably.  Continues to refuse some of her medications to include loperamide, Questran, Florastor; also refusing PT/OT evaluation.  Continues with diarrhea, 8 bowel movements reported past 24 hours.  Discussed with her extensively that if she is can refuse treatment then she can discharge home and follow-up with her outpatient providers.  Discussed with RN at bedside.  No other complaints or concerns at this time.  No family present.  Denies headache, no fever/chills/night sweats, no nausea/vomiting, no chest pain, palpitations.  No acute events overnight per staff.  Stable for transfer to floor.   Objective: Vitals:   08/11/20 1203 08/11/20 1300 08/11/20 1400 08/11/20 1500  BP:  (!) 129/91 126/82 132/78  Pulse:  75 72 69  Resp:  '15 11 19  ' Temp: 98.3 F (36.8 C)     TempSrc: Oral     SpO2:  100% 95% 100%  Weight:        Intake/Output Summary (Last 24 hours) at 08/11/2020 1512 Last data filed at 08/11/2020 0600 Gross  per 24 hour  Intake 163.26 ml  Output 1250 ml  Net -1086.74 ml   Filed Weights   08/09/20 0100 08/10/20 0500  Weight: 52.6 kg 52.8 kg    Examination:  General exam: Appears calm and comfortable, ill in appearance Respiratory system: Clear to auscultation. Respiratory effort normal.  On room air Cardiovascular system: S1 & S2 heard, RRR. No JVD, murmurs, rubs,  gallops or clicks. No pedal edema. Gastrointestinal system: Abdomen is nondistended, soft and mild tenderness to palpation diffusely, no organomegaly or masses felt. Normal bowel sounds heard. Central nervous system: Alert and oriented. No focal neurological deficits. Extremities: Symmetric 5 x 5 power. Skin: No rashes, lesions or ulcers Psychiatry: Judgement and insight appear poor. Mood & affect appropriate.     Data Reviewed: I have personally reviewed following labs and imaging studies  CBC: Recent Labs  Lab 08/08/20 1605 08/08/20 2133 08/09/20 0221 08/09/20 1849  WBC 2.9*  --  3.6* 3.4*  NEUTROABS 1.5*  --   --   --   HGB 7.4* 8.2* 9.4* 8.7*  HCT 24.2* 27.0* 31.2* 27.3*  MCV 94.9  --  95.4 90.7  PLT 308  --  359 371   Basic Metabolic Panel: Recent Labs  Lab 08/08/20 1605 08/09/20 0221 08/09/20 1849 08/10/20 0655 08/10/20 1903 08/11/20 0247  NA 136 137 135 138  --  139  K 3.6 3.5 3.0* 3.6  --  3.0*  CL 115* 109 103 104  --  96*  CO2 14* 16* 23 28  --  35*  GLUCOSE 82 68* 79 75  --  86  BUN 40* 39* 33* 32*  --  22  CREATININE 3.83* 3.49* 2.60* 2.17*  --  1.37*  CALCIUM 7.9* 8.4* 8.0* 7.8*  --  7.9*  MG 1.3*  --  1.3* 1.0* 2.5* 2.2   GFR: Estimated Creatinine Clearance: 35.9 mL/min (A) (by C-G formula based on SCr of 1.37 mg/dL (H)). Liver Function Tests: Recent Labs  Lab 08/08/20 1605 08/09/20 0221 08/09/20 1849 08/11/20 0247  AST 13* '16 16 16  ' ALT '8 9 9 9  ' ALKPHOS 118 134* 119 102  BILITOT 0.4 0.5 0.5 0.3  PROT 5.0* 5.7* 5.3* 4.2*  ALBUMIN 2.7* 3.1* 2.7* 2.4*   No results for input(s): LIPASE, AMYLASE in the last 168 hours. Recent Labs  Lab 08/08/20 1606  AMMONIA 15   Coagulation Profile: Recent Labs  Lab 08/08/20 1605  INR 1.4*   Cardiac Enzymes: Recent Labs  Lab 08/08/20 1605  CKTOTAL 81   BNP (last 3 results) Recent Labs    06/04/20 1140 06/15/20 1636 07/13/20 1310  PROBNP 11,232* 2,313* 201   HbA1C: No results for  input(s): HGBA1C in the last 72 hours. CBG: Recent Labs  Lab 08/10/20 0416 08/10/20 0755 08/10/20 1124 08/10/20 1531 08/10/20 2021  GLUCAP 75 70 79 85 96   Lipid Profile: No results for input(s): CHOL, HDL, LDLCALC, TRIG, CHOLHDL, LDLDIRECT in the last 72 hours. Thyroid Function Tests: Recent Labs    08/08/20 1606  TSH 0.765   Anemia Panel: Recent Labs    08/10/20 1236  VITAMINB12 541  FOLATE 2.8*  FERRITIN 278  TIBC 162*  IRON 28  RETICCTPCT 0.8   Sepsis Labs: Recent Labs  Lab 08/08/20 1605 08/08/20 1851 08/09/20 1849  LATICACIDVEN 1.1 1.0 0.8    Recent Results (from the past 240 hour(s))  Blood Culture (routine x 2)     Status: None (Preliminary result)   Collection Time:  08/08/20  4:06 PM   Specimen: BLOOD  Result Value Ref Range Status   Specimen Description   Final    BLOOD LEFT ANTECUBITAL Performed at [facility] [address], [place], [place] [address]    Special Requests   Final    BOTTLES DRAWN AEROBIC AND ANAEROBIC Blood Culture results may not be optimal due to an inadequate volume of blood received in culture bottles Performed at [facility] [address] [place], [place] [address]    Culture   Final    NO GROWTH 3 DAYS Performed at [facility], [place] [address] [place], [place] [address]    Report Status PENDING  Incomplete  Resp Panel by RT-PCR (Flu A&B, Covid) Nasopharyngeal Swab     Status: None   Collection Time: 08/08/20  6:04 PM   Specimen: Nasopharyngeal Swab; Nasopharyngeal(NP) swabs in vial transport medium  Result Value Ref Range Status   SARS Coronavirus 2 by RT PCR NEGATIVE NEGATIVE Final    Comment: (NOTE) SARS-CoV-2 target nucleic acids are NOT DETECTED.  The SARS-CoV-2 RNA is generally detectable in upper respiratory specimens during the acute phase of infection. The lowest concentration of SARS-CoV-2 viral copies this assay can detect is 138 copies/mL. A negative  result does not preclude SARS-Cov-2 infection and should not be used as the sole basis for treatment or other patient management decisions. A negative result may occur with  improper specimen collection/handling, submission of specimen other than nasopharyngeal swab, presence of viral mutation(s) within the areas targeted by this assay, and inadequate number of viral copies(<138 copies/mL). A negative result must be combined with clinical observations, patient history, and epidemiological information. The expected result is Negative.  Fact Sheet for Patients:  EntrepreneurPulse.com.au  Fact Sheet for Healthcare Providers:  IncredibleEmployment.be  This test is no t yet approved or cleared by the [place] FDA and  has been authorized for detection and/or diagnosis of SARS-CoV-2 by FDA under an Emergency Use Authorization (EUA). This EUA will remain  in effect (meaning this test can be used) for the duration of the COVID-19 declaration under Section 564(b)(1) of the Act, 21 U.S.C.section 360bbb-3(b)(1), unless the authorization is terminated  or revoked sooner.       Influenza A by PCR NEGATIVE NEGATIVE Final   Influenza B by PCR NEGATIVE NEGATIVE Final    Comment: (NOTE) The Xpert Xpress SARS-CoV-2/FLU/RSV plus assay is intended as an aid in the diagnosis of influenza from Nasopharyngeal swab specimens and should not be used as a sole basis for treatment. Nasal washings and aspirates are unacceptable for Xpert Xpress SARS-CoV-2/FLU/RSV testing.  Fact Sheet for Patients: EntrepreneurPulse.com.au  Fact Sheet for Healthcare Providers: IncredibleEmployment.be  This test is not yet approved or cleared by the [place] FDA and has been authorized for detection and/or diagnosis of SARS-CoV-2 by FDA under an Emergency Use Authorization (EUA). This EUA will remain in effect (meaning this test can be used)  for the duration of the COVID-19 declaration under Section 564(b)(1) of the Act, 21 U.S.C. section 360bbb-3(b)(1), unless the authorization is terminated or revoked.  Performed at [facility], [place] [address] [place], [place] [address]   MRSA Next Gen by PCR, Nasal     Status: None   Collection Time: 08/09/20  1:14 AM   Specimen: Nasal Mucosa; Nasal Swab  Result Value Ref Range Status   MRSA by PCR Next Gen NOT DETECTED NOT DETECTED Final    Comment: (NOTE)  The GeneXpert MRSA Assay (FDA approved for NASAL specimens only), is one component of a comprehensive MRSA colonization surveillance program. It is not intended to diagnose MRSA infection nor to guide or monitor treatment for MRSA infections. Test performance is not FDA approved in patients less than 96 years old. Performed at [facility], [place] [address] [place], [place] [address]   Blood Culture (routine x 2)     Status: None (Preliminary result)   Collection Time: 08/09/20  2:21 AM   Specimen: BLOOD  Result Value Ref Range Status   Specimen Description   Final    BLOOD BLOOD RIGHT HAND Performed at [facility] [address], [place], [place] [address]    Special Requests   Final    BOTTLES DRAWN AEROBIC AND ANAEROBIC Blood Culture adequate volume Performed at [facility] [address] [place], [place] [address]    Culture   Final    NO GROWTH 2 DAYS Performed at [facility] [address] [place], [place] [address]    Report Status PENDING  Incomplete  C Difficile Quick Screen w PCR reflex     Status: Abnormal   Collection Time: 08/09/20  2:30 AM   Specimen: STOOL  Result Value Ref Range Status   C Diff antigen NON REACTIVE (A) NEGATIVE Final   C Diff toxin NON REACTIVE (A) NEGATIVE Final   C Diff interpretation NEGATIVE  Final    Comment: Performed at [facility], [place] [address], [place], [place]  [address]  Gastrointestinal Panel by PCR , Stool     Status: None   Collection Time: 08/09/20 11:08 AM   Specimen: Stool  Result Value Ref Range Status   Campylobacter species NOT DETECTED NOT DETECTED Final   Plesimonas shigelloides NOT DETECTED NOT DETECTED Final   Salmonella species NOT DETECTED NOT DETECTED Final   Yersinia enterocolitica NOT DETECTED NOT DETECTED Final   Vibrio species NOT DETECTED NOT DETECTED Final   Vibrio cholerae NOT DETECTED NOT DETECTED Final   Enteroaggregative E coli (EAEC) NOT DETECTED NOT DETECTED Final   Enteropathogenic E coli (EPEC) NOT DETECTED NOT DETECTED Final   Enterotoxigenic E coli (ETEC) NOT DETECTED NOT DETECTED Final   Shiga like toxin producing E coli (STEC) NOT DETECTED NOT DETECTED Final   Shigella/Enteroinvasive E coli (EIEC) NOT DETECTED NOT DETECTED Final   Cryptosporidium NOT DETECTED NOT DETECTED Final   Cyclospora cayetanensis NOT DETECTED NOT DETECTED Final   Entamoeba histolytica NOT DETECTED NOT DETECTED Final   Giardia lamblia NOT DETECTED NOT DETECTED Final   Adenovirus F40/41 NOT DETECTED NOT DETECTED Final   Astrovirus NOT DETECTED NOT DETECTED Final   Norovirus GI/GII NOT DETECTED NOT DETECTED Final   Rotavirus A NOT DETECTED NOT DETECTED Final   Sapovirus (I, II, IV, and V) NOT DETECTED NOT DETECTED Final    Comment: Performed at [facility], [address], [place], [place] [address]         Radiology Studies: No results found.      Scheduled Meds:  (feeding supplement) PROSource Plus  30 mL Oral BID BM   Chlorhexidine Gluconate Cloth  6 each Topical Daily   cholestyramine light  4 g Oral TID   escitalopram  10 mg Oral Daily   feeding supplement  1 Container Oral TID BM   fluticasone furoate-vilanterol  1 puff Inhalation Daily   And   umeclidinium bromide  1 puff Inhalation Daily  folic acid  1 mg Oral Daily   montelukast  10 mg Oral QHS   multivitamin with minerals  1 tablet Oral Daily    naLOXone (NARCAN)  injection  0.4 mg Intravenous Once   pantoprazole  80 mg Oral Daily   potassium chloride  40 mEq Oral BID   saccharomyces boulardii  250 mg Oral BID   topiramate  25 mg Oral BID   Continuous Infusions:   sodium bicarbonate (isotonic) infusion in sterile water 125 mL/hr at 08/11/20 1051     LOS: 3 days    Time spent: 41 minutes spent on chart review, discussion with nursing staff, consultants, updating family and interview/physical exam; more than 50% of that time was spent in counseling and/or coordination of care.    Tonea Leiphart J [place] DO [facility] Available via Epic secure chat 7am-7pm After these hours, please refer to coverage provider listed on [email or web address] 08/11/2020, 3:12 PM

## 2020-08-11 NOTE — Progress Notes (Signed)
OT Cancellation Note  Patient Details Name: Tiffany [name] MRN: [id number] DOB: 05-04-1960   Cancelled Treatment:    Reason Eval/Treat Not Completed: Patient declined, no reason specified. Patient has adamantly refused OT and PT and reports so to nursing as well. Does not want to pursue therapy. OT will sign off. Please order if patient agreeable to therapy.  Sandrina Heaton L Jazlen Ogarro 08/11/2020, 6:40 AM

## 2020-08-12 ENCOUNTER — Telehealth: Payer: Self-pay | Admitting: Family Medicine

## 2020-08-12 ENCOUNTER — Ambulatory Visit (HOSPITAL_COMMUNITY): Payer: 59 | Admitting: Psychiatry

## 2020-08-12 DIAGNOSIS — N179 Acute kidney failure, unspecified: Secondary | ICD-10-CM | POA: Diagnosis not present

## 2020-08-12 LAB — BASIC METABOLIC PANEL
Anion gap: 7 (ref 5–15)
BUN: 14 mg/dL (ref 8–23)
CO2: 34 mmol/L — ABNORMAL HIGH (ref 22–32)
Calcium: 8 mg/dL — ABNORMAL LOW (ref 8.9–10.3)
Chloride: 97 mmol/L — ABNORMAL LOW (ref 98–111)
Creatinine, Ser: 1.33 mg/dL — ABNORMAL HIGH (ref 0.44–1.00)
GFR, Estimated: 46 mL/min — ABNORMAL LOW (ref >60)
Glucose, Bld: 81 mg/dL (ref 70–99)
Potassium: 3.8 mmol/L (ref 3.5–5.1)
Sodium: 138 mmol/L (ref 135–145)

## 2020-08-12 LAB — MAGNESIUM: Magnesium: 1.5 mg/dL — ABNORMAL LOW (ref 1.7–2.4)

## 2020-08-12 LAB — OSMOLALITY, STOOL: Osmolality,Stl: 273 mOsmol/kg

## 2020-08-12 MED ORDER — DICLOFENAC SODIUM 1 % EX GEL: 4.0000 g | Freq: Four times a day (QID) | CUTANEOUS | Status: DC

## 2020-08-12 MED ORDER — GABAPENTIN 300 MG PO CAPS
300.0000 mg | ORAL_CAPSULE | Freq: Once | ORAL | Status: AC
  Administered 2020-08-12: 300 mg via ORAL
  Filled 2020-08-12: qty 1

## 2020-08-12 MED ORDER — GABAPENTIN 300 MG PO CAPS
300.0000 mg | ORAL_CAPSULE | Freq: Three times a day (TID) | ORAL | Status: DC
  Administered 2020-08-12 – 2020-08-16 (×13): 300 mg via ORAL
  Filled 2020-08-12 (×13): qty 1

## 2020-08-12 MED ORDER — DICLOFENAC SODIUM 1 % EX GEL
4.0000 g | Freq: Four times a day (QID) | CUTANEOUS | Status: DC
  Administered 2020-08-12 – 2020-08-16 (×16): 4 g via TOPICAL
  Filled 2020-08-12: qty 100

## 2020-08-12 MED ORDER — MAGNESIUM SULFATE 4 GM/100ML IV SOLN
4.0000 g | Freq: Once | INTRAVENOUS | Status: AC
  Administered 2020-08-12: 4 g via INTRAVENOUS
  Filled 2020-08-12: qty 100

## 2020-08-12 NOTE — Telephone Encounter (Signed)
FYI

## 2020-08-12 NOTE — Progress Notes (Signed)
PROGRESS NOTE    Tiffany [name]  ALP:[id number] DOB: 06/05/1960 DOA: 08/08/2020 PCP: [name], MD    Brief Narrative:  Tiffany [name] is a 60 year old female with past medical history significant for COPD, chronic diastolic congestive heart failure, essential hypertension, CKD stage IIIa, anemia of chronic medical disease, history of substance abuse, history of cardiac arrest 2018 with subsequent anoxic brain injury, schizoaffective disorder who presented to [facility] ED via EMS with weakness and lethargy.  Also family reported bright red blood in her stool.  On EMS arrival, they reported hypotension with BP 80/40.   Patient with recent admission earlier this month for nausea/vomiting/diarrhea with associated metabolic derangements and altered mental status with improvement after IV fluid hydration and repletion of her electrolytes.  C. difficile PCR and GI pathogen panel were negative during that admission.  It was recommended that she go to SNF but she declined during that admission.  In the ED, BP 62/30, temperature 97.9 F, HR 71, RR 14, SPO2 99% on room air.  Sodium 136, potassium 3.6, chloride 115, CO2 14, BUN 40, creatinine 3.83 with baseline 1.2-1.3, magnesium 1.3, AST 13, ALT 8, total bilirubin 0.4.  Lactic acid 1.1.  WBC 2.9, hemoglobin 7.4, platelets 308.  Tylenol level less than 10, salicylate level less than 7.0.  TSH 0.765.  FOBT negative.  EtOH level less than 10.  CT head without contrast with no acute intracranial process.  Chest x-ray with no acute cardiopulmonary disease process.  Hospital service was consulted for further evaluation and management of altered mental status, hypotension, acute on chronic renal failure and diarrhea.   Assessment & Plan:   Principal Problem:   AKI (acute kidney injury) ([place]) Active Problems:   Type 2 diabetes mellitus (HCC)   Anemia   Chronic respiratory failure with hypoxia (HCC)   History of drug abuse in remission [facility])   Essential  hypertension   Stage 2 skin ulcer of sacral region [facility])   Pelvic mass in female   Acute metabolic encephalopathy   Schizophrenia, schizo-affective (HCC)   Metabolic acidosis, normal anion gap (NAG)   Acute metabolic encephalopathy, POA; improving Patient presenting to the ED via EMS with confusion.  Patient is afebrile without leukocytosis.  Chest x-ray with no acute cardiopulmonary disease process.  Urinalysis unrevealing.  Lactic acid 1.0.  Covid-19/influenza A/B PCR negative.  CT head with no acute intracranial finding. --Continue supportive care and treatment as below  AKI on CKD stage IIIb Non-anion gap metabolic acidosis, POA Viral gastroenteritis vs IBS vs microscopic colitis vs IBD Patient presenting with recurrent diarrhea with poor oral intake.  Baseline creatinine 1.2-1.3.  Creatinine on arrival 3.83.  Patient is afebrile without leukocytosis.  CT abdomen/pelvis with: Nondilated but distended with fluid likely consistent with nonspecific colitis.  Etiology likely volume depletion in the setting of profuse diarrhea.  C. difficile PCR and GI PCR panel negative.  Suspect irritable bowel syndrome versus microscopic colitis versus inflammatory bowel disease.  ESR/CRP unrevealing.  Fecal lactoferrin positive. --Cr 3.83>3.49>2.17>1.37>1.33 --Serum bicarbonate drip discontinued on 7/27 --Loperamide prn and cholestyramine TID --Florastor --fecal calprotectin: pending --Avoid nephrotoxins, renally dose all medications --Advance diet today --Monitor BMP daily  Hypokalemia Hypomagnesemia Etiology likely secondary to profuse diarrhea.  Replete magnesium today. --Follow electrolytes closely daily and replete as needed  Hypotension Hx of essential hypertension On amlodipine 5 mg p.o. daily and furosemide 80 mg p.o. daily at home.  BP 62/30 on arrival.  Received IV fluid hydration. --BP much improved, 140/86 this  morning. --Continue to hold home antihypertensives --Hydralazine 13m PO  q6h prn SBP >180 or DBP >110 --Continue to closely monitor BP  Acute urinary retention RN reports poor urine output today, bladder scan with 411 mL of retained urine. --Foley catheter placed 7/25; DC today with voiding trial -- Bladder scan and next void following Foley cath removal or if no void in 6 hours  Chronic diastolic congestive heart failure On furosemide 80 mg p.o. daily at baseline.  TTE 05/13/2020 with LVEF 55-60% with grade 1 diastolic dysfunction. --Continue to hold home furosemide given significant hypotension on arrival with associated poor oral intake and diarrhea. --Strict I's and O's and daily weights  Anemia of chronic medical disease Patient's family reported bright red blood per rectum.  FOBT x2 on admission negative.  Hemoglobin stable.  No signs of blood in stool reported by nursing staff since hospitalization. --Hgb 7.4>8.2>9.4>8.7, stable  COPD --Montelukast 10 mg p.o. daily --Trelegy Ellipta 1 puff daily --Albuterol neb as needed shortness of breath/wheezing  Schizoaffective disorder Depression/anxiety Home regimen includes Valium 112mPO TID prn anxiety, Topamax 2555mO BID, Asenapine 90m56m BID, Lexapro 10 mg p.o. daily, Prazosin 5 mg p.o. twice daily, 2 mg p.o. nightly --Lexapro 10 mg p.o. daily --Topamax 25 mg p.o. twice daily --Continue to hold remainder of home medications  GERD: continue PPI  Left adnexal cyst Unchanged 8.4 cm left adnexal cyst on CT abdomen/pelvis on admission.  Recommend follow-up pelvic ultrasound in 6-12 months for further surveillance.  Weakness/deconditioning/debility: Patient recently hospitalized with recommendations of SNF on discharge, but she subsequently declined.  Patient refusing PT/OT evaluation and they have now signed off.  DVT prophylaxis: SCDs Start: 08/08/20 1939   Code Status: Full Code Family Communication: No family present at bedside this morning  Disposition Plan:  Level of care: Med-Surg Status is:  Inpatient  Remains inpatient appropriate because:Persistent severe electrolyte disturbances, Ongoing diagnostic testing needed not appropriate for outpatient work up, Unsafe d/c plan, IV treatments appropriate due to intensity of illness or inability to take PO, and Inpatient level of care appropriate due to severity of illness  Dispo: The patient is from: Home              Anticipated d/c is to:  To be determined              Patient currently is not medically stable to d/c.   Difficult to place patient No   Consultants:  None  Procedures:  None  Antimicrobials:  None   Subjective: Patient seen examined bedside, resting comfortably.  Patient more adherent to Prevalite and Imodium yesterday, only 1 bowel movement reported.  Much better improvement.  Will attempt voiding trial today with Foley removal.  No other complaints or concerns at this time.  No family present.  Denies headache, no fever/chills/night sweats, no nausea/vomiting, no chest pain, palpitations.  No acute events overnight per staff.     Objective: Vitals:   08/12/20 0500 08/12/20 0640 08/12/20 0809 08/12/20 0959  BP:  140/86  103/76  Pulse:  70  75  Resp:  16  20  Temp:  98.2 F (36.8 C)  98.7 F (37.1 C)  TempSrc:    Oral  SpO2:  97% 96% 96%  Weight: 65.8 kg       Intake/Output Summary (Last 24 hours) at 08/12/2020 1055 Last data filed at 08/12/2020 1008 Gross per 24 hour  Intake 480 ml  Output 2300 ml  Net -1820 ml   FileDanley Danker  Weights   08/09/20 0100 08/10/20 0500 08/12/20 0500  Weight: 52.6 kg 52.8 kg 65.8 kg    Examination:  General exam: Appears calm and comfortable, ill in appearance Respiratory system: Clear to auscultation. Respiratory effort normal.  On room air Cardiovascular system: S1 & S2 heard, RRR. No JVD, murmurs, rubs, gallops or clicks. No pedal edema. Gastrointestinal system: Abdomen is nondistended, soft and mild tenderness to palpation diffusely, no organomegaly or masses felt.  Normal bowel sounds heard. Central nervous system: Alert and oriented. No focal neurological deficits. Extremities: Symmetric 5 x 5 power. Skin: No rashes, lesions or ulcers Psychiatry: Judgement and insight appear poor. Mood & affect appropriate.     Data Reviewed: I have personally reviewed following labs and imaging studies  CBC: Recent Labs  Lab 08/08/20 1605 08/08/20 2133 08/09/20 0221 08/09/20 1849  WBC 2.9*  --  3.6* 3.4*  NEUTROABS 1.5*  --   --   --   HGB 7.4* 8.2* 9.4* 8.7*  HCT 24.2* 27.0* 31.2* 27.3*  MCV 94.9  --  95.4 90.7  PLT 308  --  359 941   Basic Metabolic Panel: Recent Labs  Lab 08/09/20 0221 08/09/20 1849 08/10/20 0655 08/10/20 1903 08/11/20 0247 08/12/20 0503  NA 137 135 138  --  139 138  K 3.5 3.0* 3.6  --  3.0* 3.8  CL 109 103 104  --  96* 97*  CO2 16* 23 28  --  35* 34*  GLUCOSE 68* 79 75  --  86 81  BUN 39* 33* 32*  --  22 14  CREATININE 3.49* 2.60* 2.17*  --  1.37* 1.33*  CALCIUM 8.4* 8.0* 7.8*  --  7.9* 8.0*  MG  --  1.3* 1.0* 2.5* 2.2 1.5*   GFR: Estimated Creatinine Clearance: 39.2 mL/min (A) (by C-G formula based on SCr of 1.33 mg/dL (H)). Liver Function Tests: Recent Labs  Lab 08/08/20 1605 08/09/20 0221 08/09/20 1849 08/11/20 0247  AST 13* _0 ALT _1 ALKPHOS 118 134* 119 102  BILITOT 0.4 0.5 0.5 0.3  PROT 5.0* 5.7* 5.3* 4.2*  ALBUMIN 2.7* 3.1* 2.7* 2.4*   No results for input(s): LIPASE, AMYLASE in the last 168 hours. Recent Labs  Lab 08/08/20 1606  AMMONIA 15   Coagulation Profile: Recent Labs  Lab 08/08/20 1605  INR 1.4*   Cardiac Enzymes: Recent Labs  Lab 08/08/20 1605  CKTOTAL 81   BNP (last 3 results) Recent Labs    06/04/20 1140 06/15/20 1636 07/13/20 1310  PROBNP 11,232* 2,313* 201   HbA1C: No results for input(s): HGBA1C in the last 72 hours. CBG: Recent Labs  Lab 08/10/20 0416 08/10/20 0755 08/10/20 1124 08/10/20 1531 08/10/20 2021  GLUCAP 75 70 79 85 96   Lipid  Profile: No results for input(s): CHOL, HDL, LDLCALC, TRIG, CHOLHDL, LDLDIRECT in the last 72 hours. Thyroid Function Tests: No results for input(s): TSH, T4TOTAL, FREET4, T3FREE, THYROIDAB in the last 72 hours.  Anemia Panel: Recent Labs    08/10/20 1236  VITAMINB12 541  FOLATE 2.8*  FERRITIN 278  TIBC 162*  IRON 28  RETICCTPCT 0.8   Sepsis Labs: Recent Labs  Lab 08/08/20 1605 08/08/20 1851 08/09/20 1849  LATICACIDVEN 1.1 1.0 0.8    Recent Results (from the past 240 hour(s))  Blood Culture (routine x 2)     Status: None (Preliminary result)   Collection Time: 08/08/20  4:06 PM   Specimen: BLOOD  Result Value  Ref Range Status   Specimen Description   Final    BLOOD LEFT ANTECUBITAL Performed at [facility] [address] [place], [place] [address]    Special Requests   Final    BOTTLES DRAWN AEROBIC AND ANAEROBIC Blood Culture results may not be optimal due to an inadequate volume of blood received in culture bottles Performed at [facility] [address] [place], [place] [address]    Culture   Final    NO GROWTH 3 DAYS Performed at [facility], [place] [address], [place], [place] [address]    Report Status PENDING  Incomplete  Resp Panel by RT-PCR (Flu A&B, Covid) Nasopharyngeal Swab     Status: None   Collection Time: 08/08/20  6:04 PM   Specimen: Nasopharyngeal Swab; Nasopharyngeal(NP) swabs in vial transport medium  Result Value Ref Range Status   SARS Coronavirus 2 by RT PCR NEGATIVE NEGATIVE Final    Comment: (NOTE) SARS-CoV-2 target nucleic acids are NOT DETECTED.  The SARS-CoV-2 RNA is generally detectable in upper respiratory specimens during the acute phase of infection. The lowest concentration of SARS-CoV-2 viral copies this assay can detect is 138 copies/mL. A negative result does not preclude SARS-Cov-2 infection and should not be used as the sole basis for treatment or other patient management  decisions. A negative result may occur with  improper specimen collection/handling, submission of specimen other than nasopharyngeal swab, presence of viral mutation(s) within the areas targeted by this assay, and inadequate number of viral copies(<138 copies/mL). A negative result must be combined with clinical observations, patient history, and epidemiological information. The expected result is Negative.  Fact Sheet for Patients:  EntrepreneurPulse.com.au  Fact Sheet for Healthcare Providers:  IncredibleEmployment.be  This test is no t yet approved or cleared by the [place] FDA and  has been authorized for detection and/or diagnosis of SARS-CoV-2 by FDA under an Emergency Use Authorization (EUA). This EUA will remain  in effect (meaning this test can be used) for the duration of the COVID-19 declaration under Section 564(b)(1) of the Act, 21 U.S.C.section 360bbb-3(b)(1), unless the authorization is terminated  or revoked sooner.       Influenza A by PCR NEGATIVE NEGATIVE Final   Influenza B by PCR NEGATIVE NEGATIVE Final    Comment: (NOTE) The Xpert Xpress SARS-CoV-2/FLU/RSV plus assay is intended as an aid in the diagnosis of influenza from Nasopharyngeal swab specimens and should not be used as a sole basis for treatment. Nasal washings and aspirates are unacceptable for Xpert Xpress SARS-CoV-2/FLU/RSV testing.  Fact Sheet for Patients: EntrepreneurPulse.com.au  Fact Sheet for Healthcare Providers: IncredibleEmployment.be  This test is not yet approved or cleared by the [place] FDA and has been authorized for detection and/or diagnosis of SARS-CoV-2 by FDA under an Emergency Use Authorization (EUA). This EUA will remain in effect (meaning this test can be used) for the duration of the COVID-19 declaration under Section 564(b)(1) of the Act, 21 U.S.C. section 360bbb-3(b)(1), unless the  authorization is terminated or revoked.  Performed at [facility], [place] [address], [place], [place] [address]   MRSA Next Gen by PCR, Nasal     Status: None   Collection Time: 08/09/20  1:14 AM   Specimen: Nasal Mucosa; Nasal Swab  Result Value Ref Range Status   MRSA by PCR Next Gen NOT DETECTED NOT DETECTED Final    Comment: (NOTE) The GeneXpert MRSA Assay (FDA approved for NASAL specimens only), is  one component of a comprehensive MRSA colonization surveillance program. It is not intended to diagnose MRSA infection nor to guide or monitor treatment for MRSA infections. Test performance is not FDA approved in patients less than 68 years old. Performed at [facility], [place] [address], [place], [place] [address]   Blood Culture (routine x 2)     Status: None (Preliminary result)   Collection Time: 08/09/20  2:21 AM   Specimen: BLOOD  Result Value Ref Range Status   Specimen Description   Final    BLOOD BLOOD RIGHT HAND Performed at [facility] [address], [place], [place] [address]    Special Requests   Final    BOTTLES DRAWN AEROBIC AND ANAEROBIC Blood Culture adequate volume Performed at [facility] [address], [place], [place] [address]    Culture   Final    NO GROWTH 2 DAYS Performed at [facility] [address] [place], [place] [address]    Report Status PENDING  Incomplete  C Difficile Quick Screen w PCR reflex     Status: Abnormal   Collection Time: 08/09/20  2:30 AM   Specimen: STOOL  Result Value Ref Range Status   C Diff antigen NON REACTIVE (A) NEGATIVE Final   C Diff toxin NON REACTIVE (A) NEGATIVE Final   C Diff interpretation NEGATIVE  Final    Comment: Performed at [facility], [place] [address], [place], [place] [address]  Gastrointestinal Panel by PCR , Stool     Status: None   Collection Time: 08/09/20 11:08 AM   Specimen: Stool  Result  Value Ref Range Status   Campylobacter species NOT DETECTED NOT DETECTED Final   Plesimonas shigelloides NOT DETECTED NOT DETECTED Final   Salmonella species NOT DETECTED NOT DETECTED Final   Yersinia enterocolitica NOT DETECTED NOT DETECTED Final   Vibrio species NOT DETECTED NOT DETECTED Final   Vibrio cholerae NOT DETECTED NOT DETECTED Final   Enteroaggregative E coli (EAEC) NOT DETECTED NOT DETECTED Final   Enteropathogenic E coli (EPEC) NOT DETECTED NOT DETECTED Final   Enterotoxigenic E coli (ETEC) NOT DETECTED NOT DETECTED Final   Shiga like toxin producing E coli (STEC) NOT DETECTED NOT DETECTED Final   Shigella/Enteroinvasive E coli (EIEC) NOT DETECTED NOT DETECTED Final   Cryptosporidium NOT DETECTED NOT DETECTED Final   Cyclospora cayetanensis NOT DETECTED NOT DETECTED Final   Entamoeba histolytica NOT DETECTED NOT DETECTED Final   Giardia lamblia NOT DETECTED NOT DETECTED Final   Adenovirus F40/41 NOT DETECTED NOT DETECTED Final   Astrovirus NOT DETECTED NOT DETECTED Final   Norovirus GI/GII NOT DETECTED NOT DETECTED Final   Rotavirus A NOT DETECTED NOT DETECTED Final   Sapovirus (I, II, IV, and V) NOT DETECTED NOT DETECTED Final    Comment: Performed at [facility], [address] [place], [place] [address]         Radiology Studies: No results found.      Scheduled Meds:  (feeding supplement) PROSource Plus  30 mL Oral BID BM   Chlorhexidine Gluconate Cloth  6 each Topical Daily   cholestyramine light  4 g Oral TID   diclofenac Sodium  4 g Topical QID   escitalopram  10 mg Oral Daily   feeding supplement  1 Container Oral TID BM   fluticasone furoate-vilanterol  1 puff Inhalation Daily   And   umeclidinium bromide  1 puff Inhalation Daily   folic  acid  1 mg Oral Daily   gabapentin  300 mg Oral TID   montelukast  10 mg Oral QHS   multivitamin with minerals  1 tablet Oral Daily   naLOXone (NARCAN)  injection  0.4 mg Intravenous Once    pantoprazole  80 mg Oral Daily   saccharomyces boulardii  250 mg Oral BID   topiramate  25 mg Oral BID   Continuous Infusions:     LOS: 4 days    Time spent: 41 minutes spent on chart review, discussion with nursing staff, consultants, updating family and interview/physical exam; more than 50% of that time was spent in counseling and/or coordination of care.     J [place] DO [facility] Available via Epic secure chat 7am-7pm After these hours, please refer to coverage provider listed on [email or web address] 08/12/2020, 10:55 AM
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
## 2020-08-13 DIAGNOSIS — N179 Acute kidney failure, unspecified: Secondary | ICD-10-CM | POA: Diagnosis not present

## 2020-08-13 LAB — URINALYSIS, ROUTINE W REFLEX MICROSCOPIC
Bilirubin Urine: NEGATIVE
Glucose, UA: NEGATIVE mg/dL
Hgb urine dipstick: NEGATIVE
Ketones, ur: NEGATIVE mg/dL
Nitrite: NEGATIVE
Protein, ur: NEGATIVE mg/dL
Specific Gravity, Urine: 1.006 (ref 1.005–1.030)
WBC, UA: >50 WBC/hpf — ABNORMAL HIGH (ref 0–5)
pH: 9 — ABNORMAL HIGH (ref 5.0–8.0)

## 2020-08-13 LAB — MAGNESIUM: Magnesium: 2 mg/dL (ref 1.7–2.4)

## 2020-08-13 LAB — CALPROTECTIN, FECAL: Calprotectin, Fecal: 277 ug/g — ABNORMAL HIGH (ref 0–120)

## 2020-08-13 LAB — BASIC METABOLIC PANEL
Anion gap: 6 (ref 5–15)
BUN: 10 mg/dL (ref 8–23)
CO2: 30 mmol/L (ref 22–32)
Calcium: 8.6 mg/dL — ABNORMAL LOW (ref 8.9–10.3)
Chloride: 100 mmol/L (ref 98–111)
Creatinine, Ser: 1.2 mg/dL — ABNORMAL HIGH (ref 0.44–1.00)
GFR, Estimated: 52 mL/min — ABNORMAL LOW (ref >60)
Glucose, Bld: 82 mg/dL (ref 70–99)
Potassium: 4.1 mmol/L (ref 3.5–5.1)
Sodium: 136 mmol/L (ref 135–145)

## 2020-08-13 NOTE — TOC Progression Note (Signed)
Transition of Care [facility]) - Progression Note    Patient Details  Name: Tiffany [name] MRN: [id number] Date of Birth: [birth year]  Transition of Care [facility]) CM/SW Contact  Jaqwon Manfred, Juliann Pulse, RN Phone Number: 08/13/2020, 2:37 PM  Clinical Narrative:  Received call from [place] [phone] have an open case-continuing to follow.     Expected Discharge Plan: [place] Barriers to Discharge: Continued Medical Work up  Expected Discharge Plan and Services Expected Discharge Plan: [place]   Discharge Planning Services: CM Consult Post Acute Care Choice: Resumption of Svcs/PTA Provider Living arrangements for the past 2 months: Apartment                                       Social Determinants of Health (SDOH) Interventions    Readmission Risk Interventions Readmission Risk Prevention Plan 07/26/2020 02/23/2020  Transportation Screening Complete Complete  Medication Review Press photographer) Complete Complete  PCP or Specialist appointment within 3-5 days of discharge Complete Complete  HRI or Home Care Consult Complete Complete  SW Recovery Care/Counseling Consult Complete Complete  Palliative Care Screening Not Applicable Complete  Skilled Nursing Facility Complete Complete  Some recent data might be hidden

## 2020-08-13 NOTE — Progress Notes (Signed)
PROGRESS NOTE    Tiffany [name]  FMM:[id number] DOB: [birth year] DOA: 08/08/2020 PCP: [name], MD    Brief Narrative:  Tiffany [name] is a 60 year old female with past medical history significant for COPD, chronic diastolic congestive heart failure, essential hypertension, CKD stage IIIa, anemia of chronic medical disease, history of substance abuse, history of cardiac arrest 2018 with subsequent anoxic brain injury, schizoaffective disorder who presented to [facility] ED via EMS with weakness and lethargy.  Also family reported bright red blood in her stool.  On EMS arrival, they reported hypotension with BP 80/40.   Patient with recent admission earlier this month for nausea/vomiting/diarrhea with associated metabolic derangements and altered mental status with improvement after IV fluid hydration and repletion of her electrolytes.  C. difficile PCR and GI pathogen panel were negative during that admission.  It was recommended that she go to SNF but she declined during that admission.  In the ED, BP 62/30, temperature 97.9 F, HR 71, RR 14, SPO2 99% on room air.  Sodium 136, potassium 3.6, chloride 115, CO2 14, BUN 40, creatinine 3.83 with baseline 1.2-1.3, magnesium 1.3, AST 13, ALT 8, total bilirubin 0.4.  Lactic acid 1.1.  WBC 2.9, hemoglobin 7.4, platelets 308.  Tylenol level less than 10, salicylate level less than 7.0.  TSH 0.765.  FOBT negative.  EtOH level less than 10.  CT head without contrast with no acute intracranial process.  Chest x-ray with no acute cardiopulmonary disease process.  Hospital service was consulted for further evaluation and management of altered mental status, hypotension, acute on chronic renal failure and diarrhea.   Assessment & Plan:   Principal Problem:   AKI (acute kidney injury) ([place]) Active Problems:   Type 2 diabetes mellitus (HCC)   Anemia   Chronic respiratory failure with hypoxia (HCC)   History of drug abuse in remission [facility])   Essential  hypertension   Stage 2 skin ulcer of sacral region [facility])   Pelvic mass in female   Acute metabolic encephalopathy   Schizophrenia, schizo-affective (HCC)   Metabolic acidosis, normal anion gap (NAG)   Acute metabolic encephalopathy, POA; improving Patient presenting to the ED via EMS with confusion.  Patient is afebrile without leukocytosis.  Chest x-ray with no acute cardiopulmonary disease process.  Urinalysis unrevealing.  Lactic acid 1.0.  Covid-19/influenza A/B PCR negative.  CT head with no acute intracranial finding. --Continue supportive care and treatment as below  AKI on CKD stage IIIb: Resolved Non-anion gap metabolic acidosis, POA: Resolved Chronic diarrhea concerning for inflammatory bowel disease versus IBS Patient presenting with recurrent diarrhea with poor oral intake.  Baseline creatinine 1.2-1.3.  Creatinine on arrival 3.83.  Patient is afebrile without leukocytosis.  CT abdomen/pelvis with: Nondilated but distended with fluid likely consistent with nonspecific colitis.  Etiology likely volume depletion in the setting of profuse diarrhea.  C. difficile PCR and GI PCR panel negative.  Suspect irritable bowel syndrome versus microscopic colitis versus inflammatory bowel disease.  ESR/CRP unrevealing.  Fecal lactoferrin positive and fecal calprotectin elevated at 277. --Cr 3.83>3.49>2.17>1.37>1.33>1.20 --Serum bicarbonate drip discontinued on 7/27 --Loperamide prn and cholestyramine TID --Florastor --Avoid nephrotoxins, renally dose all medications --Monitor BMP daily --[facility] consulted for further evaluation and recommendations for elevated fecal lactoferrin/calprotectin and concerns for inflammatory bowel disease.  Hypokalemia Hypomagnesemia Etiology likely secondary to profuse diarrhea.  Magnesium 2.0 today and potassium 4.1. --Follow electrolytes closely daily and replete as needed  Hypotension Hx of essential hypertension On amlodipine 5 mg p.o. daily  and  furosemide 80 mg p.o. daily at home.  BP 62/30 on arrival.  Received IV fluid hydration. --BP much improved, 147/90 this morning. --Continue to hold home antihypertensives --Hydralazine 5m PO q6h prn SBP >180 or DBP >110 --Continue to closely monitor BP  Acute urinary retention: resovled RN reports poor urine output today, bladder scan with 411 mL of retained urine. --Foley catheter placed 7/25; Dc'd 7/28 --Bladder scan prn  Chronic diastolic congestive heart failure On furosemide 80 mg p.o. daily at baseline.  TTE 05/13/2020 with LVEF 55-60% with grade 1 diastolic dysfunction. --Continue to hold home furosemide given significant hypotension on arrival with associated poor oral intake and diarrhea. --Strict I's and O's and daily weights  Anemia of chronic medical disease Patient's family reported bright red blood per rectum.  FOBT x2 on admission negative.  Hemoglobin stable.  No signs of blood in stool reported by nursing staff since hospitalization. --Hgb 7.4>8.2>9.4>8.7, stable  COPD --Montelukast 10 mg p.o. daily --Trelegy Ellipta 1 puff daily --Albuterol neb as needed shortness of breath/wheezing  Schizoaffective disorder Depression/anxiety Home regimen includes Valium 187mPO TID prn anxiety, Topamax 2555mO BID, Asenapine 53m12m BID, Lexapro 10 mg p.o. daily, Prazosin 5 mg p.o. twice daily, 2 mg p.o. nightly --Lexapro 10 mg p.o. daily --Topamax 25 mg p.o. twice daily --Continue to hold remainder of home medications  GERD: continue PPI  Left adnexal cyst Unchanged 8.4 cm left adnexal cyst on CT abdomen/pelvis on admission.  Recommend follow-up pelvic ultrasound in 6-12 months for further surveillance.  Weakness/deconditioning/debility: Patient recently hospitalized with recommendations of SNF on discharge, but she subsequently declined.  Patient refusing PT/OT evaluation and they have now signed off.  DVT prophylaxis: SCDs Start: 08/08/20 1939   Code Status: Full  Code Family Communication: No family present at bedside this morning  Disposition Plan:  Level of care: Med-Surg Status is: Inpatient  Remains inpatient appropriate because:Persistent severe electrolyte disturbances, Ongoing diagnostic testing needed not appropriate for outpatient work up, Unsafe d/c plan, IV treatments appropriate due to intensity of illness or inability to take PO, and Inpatient level of care appropriate due to severity of illness  Dispo: The patient is from: Home              Anticipated d/c is to:  To be determined              Patient currently is not medically stable to d/c.   Difficult to place patient No   Consultants:  [facility], Dr. OutlPaulita Fujitaocedures:  None  Antimicrobials:  None   Subjective: Patient seen examined bedside, resting comfortably.  No diarrhea overnight.  Patient with notable elevated fecal calprotectin/lactoferrin, concerning for inflammatory bowel disease.  GI consulted for evaluation and further recommendations.  No other questions or concerns at this time other than she feels that her legs are weak and is amenable to see PT today.  Denies headache, no fever/chills/night sweats, no nausea/vomiting/diarrhea, no chest pain, palpitations, no cough/congestion, no abdominal pain.  No acute events overnight per nursing staff.   Objective: Vitals:   08/12/20 2036 08/13/20 0500 08/13/20 0507 08/13/20 0859  BP: 129/86  (!) 147/90   Pulse: 74  64   Resp: 18  18   Temp: 98.8 F (37.1 C)  98.3 F (36.8 C)   TempSrc: Oral  Oral   SpO2: 95%  97% 97%  Weight:  55.7 kg      Intake/Output Summary (Last 24 hours) at 08/13/2020 1055 Last  data filed at 08/13/2020 0810 Gross per 24 hour  Intake 720 ml  Output 750 ml  Net -30 ml   Filed Weights   08/10/20 0500 08/12/20 0500 08/13/20 0500  Weight: 52.8 kg 65.8 kg 55.7 kg    Examination:  General exam: Appears calm and comfortable, ill in appearance Respiratory system: Clear to auscultation.  Respiratory effort normal.  On room air Cardiovascular system: S1 & S2 heard, RRR. No JVD, murmurs, rubs, gallops or clicks. No pedal edema. Gastrointestinal system: Abdomen is nondistended, soft and mild tenderness to palpation diffusely, no organomegaly or masses felt. Normal bowel sounds heard. Central nervous system: Alert and oriented. No focal neurological deficits. Extremities: Symmetric 5 x 5 power. Skin: No rashes, lesions or ulcers Psychiatry: Judgement and insight appear poor. Mood & affect appropriate.     Data Reviewed: I have personally reviewed following labs and imaging studies  CBC: Recent Labs  Lab 08/08/20 1605 08/08/20 2133 08/09/20 0221 08/09/20 1849  WBC 2.9*  --  3.6* 3.4*  NEUTROABS 1.5*  --   --   --   HGB 7.4* 8.2* 9.4* 8.7*  HCT 24.2* 27.0* 31.2* 27.3*  MCV 94.9  --  95.4 90.7  PLT 308  --  359 831   Basic Metabolic Panel: Recent Labs  Lab 08/09/20 1849 08/10/20 0655 08/10/20 1903 08/11/20 0247 08/12/20 0503 08/13/20 0727  NA 135 138  --  139 138 136  K 3.0* 3.6  --  3.0* 3.8 4.1  CL 103 104  --  96* 97* 100  CO2 23 28  --  35* 34* 30  GLUCOSE 79 75  --  86 81 82  BUN 33* 32*  --  '22 14 10  ' CREATININE 2.60* 2.17*  --  1.37* 1.33* 1.20*  CALCIUM 8.0* 7.8*  --  7.9* 8.0* 8.6*  MG 1.3* 1.0* 2.5* 2.2 1.5* 2.0   GFR: Estimated Creatinine Clearance: 43.3 mL/min (A) (by C-G formula based on SCr of 1.2 mg/dL (H)). Liver Function Tests: Recent Labs  Lab 08/08/20 1605 08/09/20 0221 08/09/20 1849 08/11/20 0247  AST 13* '16 16 16  ' ALT '8 9 9 9  ' ALKPHOS 118 134* 119 102  BILITOT 0.4 0.5 0.5 0.3  PROT 5.0* 5.7* 5.3* 4.2*  ALBUMIN 2.7* 3.1* 2.7* 2.4*   No results for input(s): LIPASE, AMYLASE in the last 168 hours. Recent Labs  Lab 08/08/20 1606  AMMONIA 15   Coagulation Profile: Recent Labs  Lab 08/08/20 1605  INR 1.4*   Cardiac Enzymes: Recent Labs  Lab 08/08/20 1605  CKTOTAL 81   BNP (last 3 results) Recent Labs     06/04/20 1140 06/15/20 1636 07/13/20 1310  PROBNP 11,232* 2,313* 201   HbA1C: No results for input(s): HGBA1C in the last 72 hours. CBG: Recent Labs  Lab 08/10/20 0416 08/10/20 0755 08/10/20 1124 08/10/20 1531 08/10/20 2021  GLUCAP 75 70 79 85 96   Lipid Profile: No results for input(s): CHOL, HDL, LDLCALC, TRIG, CHOLHDL, LDLDIRECT in the last 72 hours. Thyroid Function Tests: No results for input(s): TSH, T4TOTAL, FREET4, T3FREE, THYROIDAB in the last 72 hours.  Anemia Panel: Recent Labs    08/10/20 1236  VITAMINB12 541  FOLATE 2.8*  FERRITIN 278  TIBC 162*  IRON 28  RETICCTPCT 0.8   Sepsis Labs: Recent Labs  Lab 08/08/20 1605 08/08/20 1851 08/09/20 1849  LATICACIDVEN 1.1 1.0 0.8    Recent Results (from the past 240 hour(s))  Blood Culture (routine x 2)  Status: None (Preliminary result)   Collection Time: 08/08/20  4:06 PM   Specimen: BLOOD  Result Value Ref Range Status   Specimen Description   Final    BLOOD LEFT ANTECUBITAL Performed at [facility] [address] [place], [place] [address]    Special Requests   Final    BOTTLES DRAWN AEROBIC AND ANAEROBIC Blood Culture results may not be optimal due to an inadequate volume of blood received in culture bottles Performed at [facility] [address], [place], [place] [address]    Culture   Final    NO GROWTH 4 DAYS Performed at [facility], [place] [address] [place], [place] [address]    Report Status PENDING  Incomplete  Resp Panel by RT-PCR (Flu A&B, Covid) Nasopharyngeal Swab     Status: None   Collection Time: 08/08/20  6:04 PM   Specimen: Nasopharyngeal Swab; Nasopharyngeal(NP) swabs in vial transport medium  Result Value Ref Range Status   SARS Coronavirus 2 by RT PCR NEGATIVE NEGATIVE Final    Comment: (NOTE) SARS-CoV-2 target nucleic acids are NOT DETECTED.  The SARS-CoV-2 RNA is generally detectable in upper respiratory specimens  during the acute phase of infection. The lowest concentration of SARS-CoV-2 viral copies this assay can detect is 138 copies/mL. A negative result does not preclude SARS-Cov-2 infection and should not be used as the sole basis for treatment or other patient management decisions. A negative result may occur with  improper specimen collection/handling, submission of specimen other than nasopharyngeal swab, presence of viral mutation(s) within the areas targeted by this assay, and inadequate number of viral copies(<138 copies/mL). A negative result must be combined with clinical observations, patient history, and epidemiological information. The expected result is Negative.  Fact Sheet for Patients:  EntrepreneurPulse.com.au  Fact Sheet for Healthcare Providers:  IncredibleEmployment.be  This test is no t yet approved or cleared by the [place] FDA and  has been authorized for detection and/or diagnosis of SARS-CoV-2 by FDA under an Emergency Use Authorization (EUA). This EUA will remain  in effect (meaning this test can be used) for the duration of the COVID-19 declaration under Section 564(b)(1) of the Act, 21 U.S.C.section 360bbb-3(b)(1), unless the authorization is terminated  or revoked sooner.       Influenza A by PCR NEGATIVE NEGATIVE Final   Influenza B by PCR NEGATIVE NEGATIVE Final    Comment: (NOTE) The Xpert Xpress SARS-CoV-2/FLU/RSV plus assay is intended as an aid in the diagnosis of influenza from Nasopharyngeal swab specimens and should not be used as a sole basis for treatment. Nasal washings and aspirates are unacceptable for Xpert Xpress SARS-CoV-2/FLU/RSV testing.  Fact Sheet for Patients: EntrepreneurPulse.com.au  Fact Sheet for Healthcare Providers: IncredibleEmployment.be  This test is not yet approved or cleared by the [place] FDA and has been authorized for detection  and/or diagnosis of SARS-CoV-2 by FDA under an Emergency Use Authorization (EUA). This EUA will remain in effect (meaning this test can be used) for the duration of the COVID-19 declaration under Section 564(b)(1) of the Act, 21 U.S.C. section 360bbb-3(b)(1), unless the authorization is terminated or revoked.  Performed at [facility], [place] [address] [place], [place] [address]   MRSA Next Gen by PCR, Nasal     Status: None   Collection Time: 08/09/20  1:14 AM   Specimen: Nasal Mucosa; Nasal Swab  Result Value Ref Range Status   MRSA by PCR Next Gen NOT DETECTED  NOT DETECTED Final    Comment: (NOTE) The GeneXpert MRSA Assay (FDA approved for NASAL specimens only), is one component of a comprehensive MRSA colonization surveillance program. It is not intended to diagnose MRSA infection nor to guide or monitor treatment for MRSA infections. Test performance is not FDA approved in patients less than 73 years old. Performed at [facility], [place] [address] [place], [place] [address]   Blood Culture (routine x 2)     Status: None (Preliminary result)   Collection Time: 08/09/20  2:21 AM   Specimen: BLOOD  Result Value Ref Range Status   Specimen Description   Final    BLOOD BLOOD RIGHT HAND Performed at [facility] [address] [place], [place] [address]    Special Requests   Final    BOTTLES DRAWN AEROBIC AND ANAEROBIC Blood Culture adequate volume Performed at [facility] [address] [place], [place] [address]    Culture   Final    NO GROWTH 3 DAYS Performed at [facility], [place] [address], [place], [name] [address]    Report Status PENDING  Incomplete  C Difficile Quick Screen w PCR reflex     Status: Abnormal   Collection Time: 08/09/20  2:30 AM   Specimen: STOOL  Result Value Ref Range Status   C Diff antigen NON REACTIVE (A) NEGATIVE Final   C Diff toxin NON REACTIVE (A)  NEGATIVE Final   C Diff interpretation NEGATIVE  Final    Comment: Performed at [facility], [place] [address] [place], [place] [address]  Gastrointestinal Panel by PCR , Stool     Status: None   Collection Time: 08/09/20 11:08 AM   Specimen: Stool  Result Value Ref Range Status   Campylobacter species NOT DETECTED NOT DETECTED Final   Plesimonas shigelloides NOT DETECTED NOT DETECTED Final   Salmonella species NOT DETECTED NOT DETECTED Final   Yersinia enterocolitica NOT DETECTED NOT DETECTED Final   Vibrio species NOT DETECTED NOT DETECTED Final   Vibrio cholerae NOT DETECTED NOT DETECTED Final   Enteroaggregative E coli (EAEC) NOT DETECTED NOT DETECTED Final   Enteropathogenic E coli (EPEC) NOT DETECTED NOT DETECTED Final   Enterotoxigenic E coli (ETEC) NOT DETECTED NOT DETECTED Final   Shiga like toxin producing E coli (STEC) NOT DETECTED NOT DETECTED Final   Shigella/Enteroinvasive E coli (EIEC) NOT DETECTED NOT DETECTED Final   Cryptosporidium NOT DETECTED NOT DETECTED Final   Cyclospora cayetanensis NOT DETECTED NOT DETECTED Final   Entamoeba histolytica NOT DETECTED NOT DETECTED Final   Giardia lamblia NOT DETECTED NOT DETECTED Final   Adenovirus F40/41 NOT DETECTED NOT DETECTED Final   Astrovirus NOT DETECTED NOT DETECTED Final   Norovirus GI/GII NOT DETECTED NOT DETECTED Final   Rotavirus A NOT DETECTED NOT DETECTED Final   Sapovirus (I, II, IV, and V) NOT DETECTED NOT DETECTED Final    Comment: Performed at [facility], [place] [place], [place] [address]  Calprotectin, Fecal     Status: Abnormal   Collection Time: 08/10/20  1:16 PM   Specimen: Stool  Result Value Ref Range Status   Calprotectin, Fecal 277 (H) 0 - 120 ug/g Final    Comment: (NOTE) Concentration     Interpretation   Follow-Up <16 - 50 ug/g     Normal           None >50 -120 ug/g     Borderline  Re-evaluate in 4-6 weeks    >120 ug/g     Abnormal         Repeat as  clinically                                   indicated Performed At: [facility] [place], [place] [id number] Rush Farmer MD VR:[id number]          Radiology Studies: No results found.      Scheduled Meds:  (feeding supplement) PROSource Plus  30 mL Oral BID BM   cholestyramine light  4 g Oral TID   diclofenac Sodium  4 g Topical QID   escitalopram  10 mg Oral Daily   feeding supplement  1 Container Oral TID BM   fluticasone furoate-vilanterol  1 puff Inhalation Daily   And   umeclidinium bromide  1 puff Inhalation Daily   folic acid  1 mg Oral Daily   gabapentin  300 mg Oral TID   montelukast  10 mg Oral QHS   multivitamin with minerals  1 tablet Oral Daily   naLOXone (NARCAN)  injection  0.4 mg Intravenous Once   pantoprazole  80 mg Oral Daily   saccharomyces boulardii  250 mg Oral BID   topiramate  25 mg Oral BID   Continuous Infusions:     LOS: 5 days    Time spent: 42 minutes spent on chart review, discussion with nursing staff, consultants, updating family and interview/physical exam; more than 50% of that time was spent in counseling and/or coordination of care.    Ramir Malerba J [place] DO [facility] Available via Epic secure chat 7am-7pm After these hours, please refer to coverage provider listed on [email or web address] 08/13/2020, 10:55 AM

## 2020-08-13 NOTE — Care Management Important Message (Signed)
Important Message  Patient Details IM Letter given to the Patient. Name: Tiffany [name] MRN: [id number] Date of Birth: 1960-05-12   Medicare Important Message Given:  Yes     Kerin Salen 08/13/2020, 11:11 AM

## 2020-08-13 NOTE — Consult Note (Signed)
[facility] Consultation Note  Referring Provider: [facility] Primary Care Physician:  [name], MD  Reason for Consultation:  Diarrhea  HPI: Tiffany [name] is a 60 y.o. female presenting diarrhea.  Intermittent and ongoing for several months, had evaluation in [place] about 6 months ago (including colonoscopy, which patient reports was negative; we don't have this report).  Diarrhea now controlled with cholestyramine and loperamide.  Stool studies unrevealing. CT unrevealing.   Past Medical History:  Diagnosis Date   Agitation 11/22/2017   Anoxic brain injury ([place]) 09/08/2016   C. Arrest due to respiratory failure and COPD exacerbation   Anxiety    Arthritis    "all over" (04/10/2016)   Asthma 10/18/2010   Binge eating disorder    Cardiac arrest ([place]) 09/08/2016   PEA   Carotid artery stenosis    1-39% bilateral by dopplers 11/2016   Chronic diastolic (congestive) heart failure ([place])    Chronic kidney disease    "I see a kidney dr." (04/10/2016)   Chronic pain syndrome 06/18/2012   Chronic post-traumatic stress disorder (PTSD) 05/27/2018   Chronic respiratory failure with hypoxia and hypercapnia ([place]) 06/22/2015   TRILOGY Vent >AVAPA-ES., Vt target 200-400, Max P 30 , PS max 20 , PS min 6-10 , E Max 6, E Min 4, Rate Auto AVAPS Rate 2 (titrate for pt comfort) , bleed O2 at 5l/m continuous flow .    CKD (chronic kidney disease) stage 3, GFR 30-59 ml/min (HCC) 12/15/2016   Closed displaced fracture of fifth metacarpal bone 03/21/2018   Cocaine use disorder, severe, in sustained remission ([place]) 63/08/9371   Complication of anesthesia    decreased bp, decreased heart rate   COPD (chronic obstructive pulmonary disease) ([place]) 07/08/2014   Depression    Diabetic neuropathy ([place]) 04/24/2011   Difficulty with speech 01/24/2018   Disorder of nervous system    Drug abuse ([place]) 11/21/2017   Dyslipidemia 04/24/2011   Elevated troponin 04/28/2012   Emphysema    Encephalopathy  11/21/2017   Essential hypertension 03/22/2016   Fibula fracture 07/10/2016   Frequent falls 10/11/2017   GERD (gastroesophageal reflux disease)    Gout 04/11/2017   Heart attack ([place]) 1980s   History of blood transfusion 1994   "couldn't stop bleeding from my period"   History of drug abuse in remission ([place]) 11/28/2015   Quit in 2017   Hyperlipidemia LDL goal <70    Incontinence    Manic depression ([place])    Morbid obesity ([place]) 10/18/2010   Obstructive sleep apnea 10/18/2010   On home oxygen therapy    "6L; 24/7" (04/10/2016)   OSA on CPAP    "wear mask sometimes" (04/10/2016)   Paranoid ([place])    "sometimes; I'm on RX for it" (04/10/2016)   Prolonged Q-T interval on ECG    Rectal bleeding 12/31/2015   Right carotid bruit 11/09/2016   Schizoaffective disorder, bipolar type ([place]) 04/05/2018   Seasonal allergies    Seborrheic keratoses 12/31/2013   Seizures ([place])    "don't know what kind; last one was ~ 1 yr ago" (04/10/2016)   Stroke [facility]) 1980s   denies residual on 04/10/2016   Thrush 09/19/2013   Type 2 diabetes mellitus ([place]) 10/18/2010    Past Surgical History:  Procedure Laterality Date   CESAREAN SECTION  1997   HERNIA REPAIR     IR CHOLANGIOGRAM EXISTING TUBE  07/20/2016   IR PERC CHOLECYSTOSTOMY  05/10/2016   IR RADIOLOGIST EVAL & MGMT  06/08/2016  IR RADIOLOGIST EVAL & MGMT  06/29/2016   IR SINUS/FIST TUBE CHK-NON GI  07/12/2016   RIGHT/LEFT HEART CATH AND CORONARY ANGIOGRAPHY N/A 06/19/2017   Procedure: RIGHT/LEFT HEART CATH AND CORONARY ANGIOGRAPHY;  Surgeon: Jolaine Artist, MD;  Location: [facility];  Service: Cardiovascular;  Laterality: N/A;   TIBIA IM NAIL INSERTION Right 07/12/2016   Procedure: INTRAMEDULLARY (IM) NAIL RIGHT TIBIA;  Surgeon: Leandrew Koyanagi, MD;  Location: [facility];  Service: Orthopedics;  Laterality: Right;   UMBILICAL HERNIA REPAIR  ~ 1963   "that's why I don't have a belly button"   VAGINAL HYSTERECTOMY      Prior to Admission medications    Medication Sig Start Date End Date Taking? Authorizing Provider  albuterol (PROVENTIL) (2.5 MG/3ML) 0.083% nebulizer solution Take 3 mLs (2.5 mg total) by nebulization every 6 (six) hours as needed for wheezing or shortness of breath. 03/17/20  Yes Collene Gobble, MD  albuterol (VENTOLIN HFA) 108 (90 Base) MCG/ACT inhaler Inhale 2 puffs into the lungs every 6 (six) hours as needed for wheezing or shortness of breath. 03/17/20  Yes Collene Gobble, MD  amLODipine (NORVASC) 5 MG tablet Take 1 tablet (5 mg total) by mouth daily. 07/29/20 08/28/20 Yes Pahwani, Einar Grad, MD  asenapine (SAPHRIS) 5 MG SUBL 24 hr tablet Place 2 tablets (10 mg total) under the tongue 2 (two) times daily. 05/31/20  Yes Cobos, Myer Peer, MD  colchicine 0.6 MG tablet Take 0.6 mg by mouth 2 (two) times daily. 05/22/20  Yes [provider]  diazepam (VALIUM) 10 MG tablet Take 1 tablet (10 mg total) by mouth 3 (three) times daily as needed for anxiety. 05/31/20 08/29/20 Yes Cobos, Myer Peer, MD  diclofenac (VOLTAREN) 75 MG EC tablet Take 75 mg by mouth 2 (two) times daily.   Yes [provider]  diclofenac Sodium (VOLTAREN) 1 % GEL Apply 4 g topically 4 (four) times daily. Patient taking differently: Apply 4 g topically 4 (four) times daily as needed (pain). 05/21/20  Yes Ladell Pier, MD  escitalopram (LEXAPRO) 10 MG tablet Take 1 tablet (10 mg total) by mouth daily. 05/20/20 05/20/21 Yes Cobos, Myer Peer, MD  feeding supplement (ENSURE ENLIVE / ENSURE PLUS) LIQD Take 237 mLs by mouth 2 (two) times daily between meals. 02/26/20  Yes Hongalgi, Lenis Dickinson, MD  Fluticasone-Umeclidin-Vilant (TRELEGY ELLIPTA) 100-62.5-25 MCG/INH AEPB Inhale 1 puff into the lungs daily. 03/17/20  Yes Collene Gobble, MD  furosemide (LASIX) 80 MG tablet Take 1 tablet (80 mg total) by mouth daily. 06/07/20  Yes [name], MD  gabapentin (NEURONTIN) 600 MG tablet Take 0.5 tablets (300 mg total) by mouth 2 (two) times daily. 05/16/20  Yes Nita Sells, MD  lidocaine (LIDODERM) 5 % Place 1 patch onto the skin daily. Remove & Discard patch within 12 hours or as directed by MD 06/23/20  Yes [name], MD  montelukast (SINGULAIR) 10 MG tablet TAKE 1 TABLET(10 MG) BY MOUTH AT BEDTIME Patient taking differently: Take 10 mg by mouth at bedtime. 07/27/20  Yes Newlin, Charlane Ferretti, MD  omeprazole (PRILOSEC) 40 MG capsule TAKE 1 CAPSULE(40 MG) BY MOUTH DAILY Patient taking differently: Take 40 mg by mouth daily. 07/27/20  Yes Newlin, Charlane Ferretti, MD  oxybutynin (DITROPAN) 5 MG tablet TAKE 1 TABLET(5 MG) BY MOUTH TWICE DAILY Patient taking differently: Take 5 mg by mouth 2 (two) times daily. 05/26/20  Yes [name], MD  potassium chloride SA (KLOR-CON) 20 MEQ tablet Take  2 tablets (40 mEq total) by mouth 2 (two) times daily. 07/14/20  Yes [name], MD  prazosin (MINIPRESS) 2 MG capsule Take 2 mg by mouth at bedtime. 07/12/20  Yes [provider]  prazosin (MINIPRESS) 5 MG capsule Take 5 mg by mouth 2 (two) times daily.   Yes [provider]  tiZANidine (ZANAFLEX) 4 MG tablet Take 1 tablet (4 mg total) by mouth every 8 (eight) hours as needed for muscle spasms. 05/25/20  Yes [name], MD  topiramate (TOPAMAX) 25 MG tablet Take 1 tablet (25 mg total) by mouth 2 (two) times daily. 06/21/20 06/21/21 Yes Cobos, Myer Peer, MD  VYVANSE 20 MG capsule Take 20 mg by mouth daily as needed (anxiety/ focusing). 06/20/20  Yes [provider]  [place] [place] [employer] wheelchair.  Diagnosis-frequent falls, bilateral knee osteoarthritis 05/31/20   [name], MD  Misc. Devices MISC Bedside Commode DX: frequent fall 07/30/20   [name], MD  thiamine 100 MG tablet Take 1 tablet (100 mg total) by mouth daily. Patient not taking: Reported on 08/09/2020 02/27/20   Modena Jansky, MD    Current Facility-Administered Medications  Medication Dose Route Frequency Provider Last Rate Last Admin   (feeding supplement) PROSource  Plus liquid 30 mL  30 mL Oral BID BM [place] Donnamarie Poag, DO       acetaminophen (TYLENOL) tablet 650 mg  650 mg Oral Q6H PRN Etta Quill, DO       Or   acetaminophen (TYLENOL) suppository 650 mg  650 mg Rectal Q6H PRN Etta Quill, DO       albuterol (PROVENTIL) (2.5 MG/3ML) 0.083% nebulizer solution 2.5 mg  2.5 mg Nebulization Q6H PRN [place] Eric J, DO       cholestyramine light (PREVALITE) packet 4 g  4 g Oral TID [place] Eric J, DO   4 g at 08/13/20 1100   diclofenac Sodium (VOLTAREN) 1 % topical gel 4 g  4 g Topical QID Kathryne Eriksson, NP   4 g at 08/13/20 0955   escitalopram (LEXAPRO) tablet 10 mg  10 mg Oral Daily [place] Eric J, DO   10 mg at 08/13/20 9702   feeding supplement (BOOST / RESOURCE BREEZE) liquid 1 Container  1 Container Oral TID BM [place] Eric J, DO       fluticasone furoate-vilanterol (BREO ELLIPTA) 100-25 MCG/INH 1 puff  1 puff Inhalation Daily [place] Donnamarie Poag, DO   1 puff at 08/13/20 6378   And   umeclidinium bromide (INCRUSE ELLIPTA) 62.5 MCG/INH 1 puff  1 puff Inhalation Daily [place] Donnamarie Poag, DO   1 puff at 58/85/02 7741   folic acid (FOLVITE) tablet 1 mg  1 mg Oral Daily [place] Donnamarie Poag, DO   1 mg at 08/13/20 2878   gabapentin (NEURONTIN) capsule 300 mg  300 mg Oral TID [place] Eric J, DO   300 mg at 08/13/20 6767   hydrALAZINE (APRESOLINE) tablet 25 mg  25 mg Oral Q6H PRN [place] Donnamarie Poag, DO       loperamide (IMODIUM) capsule 2 mg  2 mg Oral PRN [place] Donnamarie Poag, DO   2 mg at 08/11/20 1350   montelukast (SINGULAIR) tablet 10 mg  10 mg Oral QHS [place] Donnamarie Poag, DO   10 mg at 08/12/20 2132   multivitamin with minerals tablet 1 tablet  1 tablet Oral Daily [place] Eric J, DO   1 tablet at 08/13/20 0955   naloxone [facility]) injection  0.4 mg  0.4 mg Intravenous Once [place] Eric J, DO       naloxone [facility]) injection 0.4 mg  0.4 mg Intravenous PRN [place] Donnamarie Poag, DO       ondansetron [facility]) tablet 4 mg  4 mg Oral Q6H PRN Etta Quill, DO   4 mg at  08/11/20 1351   Or   ondansetron (ZOFRAN) injection 4 mg  4 mg Intravenous Q6H PRN Etta Quill, DO       pantoprazole (PROTONIX) EC tablet 80 mg  80 mg Oral Daily [place] Eric J, DO   80 mg at 08/13/20 1914   saccharomyces boulardii (FLORASTOR) capsule 250 mg  250 mg Oral BID [place] Eric J, DO   250 mg at 08/13/20 7829   topiramate (TOPAMAX) tablet 25 mg  25 mg Oral BID [place] Eric J, DO   25 mg at 08/13/20 5621    Allergies as of 08/08/2020 - Review Complete 08/08/2020  Allergen Reaction Noted   Hydrocodone Shortness Of Breath 10/08/2018   Hydroxyzine Anaphylaxis and Shortness Of Breath 03/27/2012   Latuda [lurasidone hcl] Anaphylaxis 05/09/2016   Magnesium-containing compounds Anaphylaxis 12/02/2017   Prednisone Anaphylaxis, Swelling, and Other (See Comments) 06/15/2017   Tramadol Anaphylaxis and Swelling 05/28/2017   Codeine Nausea And Vomiting 10/18/2010   Sulfa antibiotics Itching 10/18/2010   Tape Rash 10/18/2010    Family History  Problem Relation Age of Onset   Cancer Father        prostate   Cancer Mother        lung   Depression Mother    Depression Sister    Anxiety disorder Sister    Schizophrenia Sister    Bipolar disorder Sister    Depression Sister    Depression Brother    Heart failure Other        cousin    Social History   Socioeconomic History   Marital status: Widowed    Spouse name: Not on file   Number of children: 3   Years of education: Not on file   Highest education level: Not on file  Occupational History   Occupation: disabled    Comment: factory production  Tobacco Use   Smoking status: Former    Packs/day: 1.50    Years: 38.00    Pack years: 57.00    Types: Cigarettes    Start date: 03/13/1977    Quit date: 04/10/2016    Years since quitting: 4.3   Smokeless tobacco: Never  Vaping Use   Vaping Use: Never used  Substance and Sexual Activity   Alcohol use: No    Alcohol/week: 0.0 standard drinks   Drug use: No     Types: Cocaine    Comment: 04/10/2016 "last used cocaine back in November 2017"   Sexual activity: Not Currently    Birth control/protection: Surgical  Other Topics Concern   Not on file  Social History Narrative   Has 1 son, [name]   Lives with son and his boyfriend   Her house has ramps and handrails should she ever needs them.    Her mother lives down the street from her and is a good support person in addition to her son.   She drives herself, has private transportation.    Cocaine free since 02/24/16, smoke free since 04/10/16   Social Determinants of Health   Financial Resource Strain: Not on file  Food Insecurity: Not on file  Transportation Needs: Not on file  Physical Activity:  Not on file  Stress: Not on file  Social Connections: Not on file  Intimate Partner Violence: Not on file    Review of Systems: As per HPI, all others negative  Physical Exam: Vital signs in last 24 hours: Temp:  [98.3 F (36.8 C)-99.4 F (37.4 C)] 99.4 F (37.4 C) (07/29 1128) Pulse Rate:  [64-79] 79 (07/29 1128) Resp:  [16-18] 16 (07/29 1128) BP: (129-147)/(86-90) 130/87 (07/29 1128) SpO2:  [95 %-97 %] 95 % (07/29 1128) Weight:  [55.7 kg] 55.7 kg (07/29 0500) Last BM Date: 08/11/20 General:   Alert,  thin, older-appearing than stated age Head:  Normocephalic and atraumatic. Eyes:  Sclera clear, no icterus.   Conjunctiva pink. Ears:  Normal auditory acuity. Nose:  No deformity, discharge,  or lesions. Mouth:  No deformity or lesions.  Oropharynx pink & moist. Neck:  Supple; no masses or thyromegaly. Abdomen:  Soft, nontender and nondistended. No masses, hepatosplenomegaly or hernias noted. Normal bowel sounds, without guarding, and without rebound.     Msk:  Symmetrical without gross deformities. Normal posture. Pulses:  Normal pulses noted. Extremities:  Without clubbing or edema. Neurologic:  Alert and  oriented x4;  grossly normal neurologically. Skin:  Intact without significant  lesions or rashes. Psych:  Alert and cooperative. Normal mood and affect.   Lab Results: No results for input(s): WBC, HGB, HCT, PLT in the last 72 hours. BMET Recent Labs    08/11/20 0247 08/12/20 0503 08/13/20 0727  NA 139 138 136  K 3.0* 3.8 4.1  CL 96* 97* 100  CO2 35* 34* 30  GLUCOSE 86 81 82  BUN 22 14 10   CREATININE 1.37* 1.33* 1.20*  CALCIUM 7.9* 8.0* 8.6*   LFT Recent Labs    08/11/20 0247  PROT 4.2*  ALBUMIN 2.4*  AST 16  ALT 9  ALKPHOS 102  BILITOT 0.3   PT/INR No results for input(s): LABPROT, INR in the last 72 hours.  Studies/Results: No results found.  Impression:   Diarrhea.  Ongoing for 6+ months.  Patient reports having had colonoscopy in [place] several months ago for same diarrheal symptoms. (don't have records) and reports it was normal.  Diarrhea currently well controlled with cholestyramine and loperamide.  Plan:   Advance diet as tolerated. Continue cholestyramine and loperamide, now and upon hospital discharge. Would be nice to have prior recent colonoscopy done elsewhere. Patient can follow-up with me at [place] [place] upon hospital discharge. Howie Ill will sign-off; please call with questions; thank you for the consultation.   LOS: 5 days   Soley Harriss M  08/13/2020, 1:23 PM  Cell [phone] If no answer or after 5 PM call [phone]

## 2020-08-13 NOTE — Evaluation (Signed)
Physical Therapy Evaluation Patient Details Name: Tiffany [name] MRN: [id number] DOB: 12/27/1960 Today's Date: 08/13/2020   History of Present Illness  Tiffany [name] is a 60 y.o. female with medical history significant of COPD on home O2, dCHF, HTN, CKD 3, anemia, prior substance abuse, cardiac arrest in 2018, anoxic brain injury, schizoaffective disorder.Pt with recent admit earlier this month for N/V/D, metabolic derangements, AMS.EMS called 08/08/20 for pt reportedly being weak and lethargic since hospital discharge, reported  BRB in stool , found to be hypotensive.  Clinical Impression  The patient  is cooperative for participation in PT. Patient assisted to sitting and stood x 1 with max of 2 to rise and steady. Patient  took small side steps, noted foot drop on both feet.  Patient provides history that is not clear. States she lives alone then that her mother assists her./ States that she has not ambulated for some time.  Patient's Mother ( whom patient identified as)left when PT arrived. Pt admitted with above diagnosis.  Pt currently with functional limitations due to the deficits listed below (see PT Problem List). Pt will benefit from skilled PT to increase their independence and safety with mobility to allow discharge to the venue listed below.       Follow Up Recommendations SNF (most likely will ref.)    Equipment Recommendations  Wheelchair (measurements PT);Wheelchair cushion (measurements PT)    Recommendations for Other Services       Precautions / Restrictions Precautions Precautions: Fall      Mobility  Bed Mobility Overal bed mobility: Needs Assistance Bed Mobility: Rolling;Sidelying to Sit;Sit to Supine Rolling: Min guard Sidelying to sit: Min assist       General bed mobility comments: extra time to move legs to bed edge, able to slowly push self to sitting upright.  required mod assist to place legs  onto bed.    Transfers Overall transfer level:  Needs assistance Equipment used: Rolling walker (2 wheeled) Transfers: Sit to/from Stand Sit to Stand: +2 physical assistance;+2 safety/equipment;Max assist         General transfer comment: +2 max to rise at RW, noted both  knees hyperextend( decreased dorsi ROM), patient took 3 side steps with max support of 2.  Ambulation/Gait                Stairs            Wheelchair Mobility    Modified Rankin (Stroke Patients Only)       Balance   Sitting-balance support: No upper extremity supported;Feet unsupported Sitting balance-Leahy Scale: Fair Sitting balance - Comments: max of 2 to stnad in RW                                     Pertinent Vitals/Pain Faces Pain Scale: Hurts a little bit Pain Location: generalized Pain Descriptors / Indicators: Grimacing;Guarding Pain Intervention(s): Monitored during session    Home Living Family/patient expects to be discharged to:: Unsure   Available Help at Discharge: Personal care attendant Type of Home: House Home Access: Stairs to enter Entrance Stairs-Rails: None Entrance Stairs-Number of Steps: 3 Home Layout: One level Home Equipment: Cane - single point;Walker - 2 wheels;Walker - 4 wheels Additional Comments: pt inconsistent with history, states lives alone, then states  mother helps her, that she has not walked but takes care of herself    Prior Function Level of Independence:  Needs assistance   Gait / Transfers Assistance Needed: states  she has a CNA , not  clear on fubnction since DC.           Hand Dominance        Extremity/Trunk Assessment   Upper Extremity Assessment Upper Extremity Assessment: Generalized weakness    Lower Extremity Assessment Lower Extremity Assessment: LLE deficits/detail;RLE deficits/detail RLE Deficits / Details: foot drop, knee ext 2+, hip flex 3 RLE Sensation: history of peripheral neuropathy LLE Deficits / Details: lacks active dorsi flex/foot  drop, knee extension 2+, similar to  rigght LLE Sensation: history of peripheral neuropathy    Cervical / Trunk Assessment Cervical / Trunk Assessment: Kyphotic  Communication      Cognition Arousal/Alertness: Awake/alert Behavior During Therapy: Flat affect Overall Cognitive Status: Impaired/Different from baseline (patient's mother left room  after PT arrived.) Area of Impairment: Orientation;Awareness;Safety/judgement                       Following Commands: Follows one step commands with increased time Safety/Judgement: Decreased awareness of deficits;Decreased awareness of safety Awareness: Emergent   General Comments: patient very pleasant and participated      General Comments      Exercises     Assessment/Plan    PT Assessment Patient needs continued PT services  PT Problem List Decreased strength;Decreased mobility;Decreased activity tolerance;Decreased balance       PT Treatment Interventions DME instruction;Therapeutic exercise;Functional mobility training;Therapeutic activities;Patient/family education;Gait training;Balance training    PT Goals (Current goals can be found in the Care Plan section)  Acute Rehab PT Goals Patient Stated Goal: go home and get therapy PT Goal Formulation: With patient Time For Goal Achievement: 08/27/20 Potential to Achieve Goals: Fair    Frequency Min 2X/week   Barriers to discharge        Co-evaluation               AM-PAC PT "6 Clicks" Mobility  Outcome Measure Help needed turning from your back to your side while in a flat bed without using bedrails?: A Little Help needed moving from lying on your back to sitting on the side of a flat bed without using bedrails?: A Little Help needed moving to and from a bed to a chair (including a wheelchair)?: A Lot Help needed standing up from a chair using your arms (e.g., wheelchair or bedside chair)?: Total Help needed to walk in hospital room?: Total Help  needed climbing 3-5 steps with a railing? : Total 6 Click Score: 11    End of Session Equipment Utilized During Treatment: Gait belt Activity Tolerance: Patient tolerated treatment well Patient left: in bed;with call bell/phone within reach;with bed alarm set Nurse Communication: Mobility status PT Visit Diagnosis: Other abnormalities of gait and mobility (R26.89);Muscle weakness (generalized) (M62.81)    Time: 8295-6213 PT Time Calculation (min) (ACUTE ONLY): 24 min   Charges:   PT Evaluation $PT Eval Low Complexity: 1 Low PT Treatments $Therapeutic Activity: 8-22 mins        [place] Pager [phone] Office [phone]    Claretha Cooper 08/13/2020, 4:35 PM

## 2020-08-14 DIAGNOSIS — G9341 Metabolic encephalopathy: Secondary | ICD-10-CM | POA: Diagnosis not present

## 2020-08-14 DIAGNOSIS — F259 Schizoaffective disorder, unspecified: Secondary | ICD-10-CM

## 2020-08-14 DIAGNOSIS — F1911 Other psychoactive substance abuse, in remission: Secondary | ICD-10-CM | POA: Diagnosis not present

## 2020-08-14 DIAGNOSIS — R5381 Other malaise: Secondary | ICD-10-CM

## 2020-08-14 DIAGNOSIS — E872 Acidosis: Secondary | ICD-10-CM

## 2020-08-14 DIAGNOSIS — E871 Hypo-osmolality and hyponatremia: Secondary | ICD-10-CM

## 2020-08-14 DIAGNOSIS — J449 Chronic obstructive pulmonary disease, unspecified: Secondary | ICD-10-CM

## 2020-08-14 DIAGNOSIS — I10 Essential (primary) hypertension: Secondary | ICD-10-CM

## 2020-08-14 DIAGNOSIS — Z9189 Other specified personal risk factors, not elsewhere classified: Secondary | ICD-10-CM

## 2020-08-14 DIAGNOSIS — E1122 Type 2 diabetes mellitus with diabetic chronic kidney disease: Secondary | ICD-10-CM

## 2020-08-14 DIAGNOSIS — N390 Urinary tract infection, site not specified: Secondary | ICD-10-CM

## 2020-08-14 DIAGNOSIS — N179 Acute kidney failure, unspecified: Secondary | ICD-10-CM | POA: Diagnosis not present

## 2020-08-14 DIAGNOSIS — R1900 Intra-abdominal and pelvic swelling, mass and lump, unspecified site: Secondary | ICD-10-CM

## 2020-08-14 DIAGNOSIS — N1832 Chronic kidney disease, stage 3b: Secondary | ICD-10-CM

## 2020-08-14 LAB — MAGNESIUM: Magnesium: 1.7 mg/dL (ref 1.7–2.4)

## 2020-08-14 LAB — BASIC METABOLIC PANEL
Anion gap: 5 (ref 5–15)
BUN: 12 mg/dL (ref 8–23)
CO2: 26 mmol/L (ref 22–32)
Calcium: 8.4 mg/dL — ABNORMAL LOW (ref 8.9–10.3)
Chloride: 99 mmol/L (ref 98–111)
Creatinine, Ser: 1.23 mg/dL — ABNORMAL HIGH (ref 0.44–1.00)
GFR, Estimated: 50 mL/min — ABNORMAL LOW (ref >60)
Glucose, Bld: 83 mg/dL (ref 70–99)
Potassium: 3.9 mmol/L (ref 3.5–5.1)
Sodium: 130 mmol/L — ABNORMAL LOW (ref 135–145)

## 2020-08-14 LAB — POTASSIUM, STOOL: Potassium, Stl: 22 mmol/L

## 2020-08-14 LAB — CBC
HCT: 26.2 % — ABNORMAL LOW (ref 36.0–46.0)
Hemoglobin: 8.3 g/dL — ABNORMAL LOW (ref 12.0–15.0)
MCH: 28.8 pg (ref 26.0–34.0)
MCHC: 31.7 g/dL (ref 30.0–36.0)
MCV: 91 fL (ref 80.0–100.0)
Platelets: 340 10*3/uL (ref 150–400)
RBC: 2.88 MIL/uL — ABNORMAL LOW (ref 3.87–5.11)
RDW: 15.2 % (ref 11.5–15.5)
WBC: 4 10*3/uL (ref 4.0–10.5)
nRBC: 0 % (ref 0.0–0.2)

## 2020-08-14 LAB — SODIUM, STOOL: Sodium, Stl: 109 mmol/L

## 2020-08-14 LAB — CULTURE, BLOOD (ROUTINE X 2)
Culture: NO GROWTH
Culture: NO GROWTH
Special Requests: ADEQUATE

## 2020-08-14 MED ORDER — SODIUM CHLORIDE 0.9 % IV SOLN
1.0000 g | INTRAVENOUS | Status: DC
  Administered 2020-08-14 – 2020-08-16 (×3): 1 g via INTRAVENOUS
  Filled 2020-08-14: qty 10
  Filled 2020-08-14 (×2): qty 1

## 2020-08-14 MED ORDER — ASENAPINE MALEATE 5 MG SL SUBL
5.0000 mg | SUBLINGUAL_TABLET | Freq: Two times a day (BID) | SUBLINGUAL | Status: DC
  Administered 2020-08-14 – 2020-08-16 (×4): 5 mg via SUBLINGUAL
  Filled 2020-08-14 (×3): qty 1

## 2020-08-14 MED ORDER — PRAZOSIN HCL 1 MG PO CAPS
2.0000 mg | ORAL_CAPSULE | Freq: Every day | ORAL | Status: DC
  Administered 2020-08-14 – 2020-08-15 (×2): 2 mg via ORAL
  Filled 2020-08-14 (×2): qty 2

## 2020-08-14 MED ORDER — ASENAPINE MALEATE 5 MG SL SUBL
10.0000 mg | SUBLINGUAL_TABLET | Freq: Two times a day (BID) | SUBLINGUAL | Status: DC
  Filled 2020-08-14: qty 2

## 2020-08-14 NOTE — Progress Notes (Signed)
PROGRESS NOTE  Tiffany [name] SNK:[id number] DOB: Jun 14, 1960   PCP: [name], MD  Patient is from: Home.  Lives alone.  Dependent for most ADL's.  Has home aide daily.  DOA: 08/08/2020 LOS: 6  Chief complaints:  Chief Complaint  Patient presents with   Hypotension     Brief Narrative / Interim history: 60 year old F with PMH of COPD not on O2, diastolic CHF, ABI after cardiac arrest in 2018, CKD-3A, AICD, HTN, schizoaffective disorder, polysubstance use and debility presenting with generalized weakness and lethargy, and admitted for AKI, hypotension and acute metabolic encephalopathy.  Hypotension resolved with IV fluid resuscitation.  AKI improved.  Encephalopathy seems to have resolved as well.  Started having dysuria.  UA concerning for UTI.  Started on IV ceftriaxone.  Therapy recommended SNF but patient prefers to go home.    Subjective: Seen and examined earlier this morning.  No major events overnight of this morning.  She continues to complain dysuria and lower abdominal pain.  She denies chest pain, dyspnea or GI symptoms other than lower abdominal pain.  Objective: Vitals:   08/14/20 0356 08/14/20 0418 08/14/20 0835 08/14/20 1300  BP: (!) 165/98   (!) 167/96  Pulse: 67     Resp: 18     Temp: 99.1 F (37.3 C)   97.8 F (36.6 C)  TempSrc: Oral   Oral  SpO2: 97%  94% 96%  Weight:  56 kg      Intake/Output Summary (Last 24 hours) at 08/14/2020 1504 Last data filed at 08/14/2020 0400 Gross per 24 hour  Intake 960 ml  Output --  Net 960 ml   Filed Weights   08/12/20 0500 08/13/20 0500 08/14/20 0418  Weight: 65.8 kg 55.7 kg 56 kg    Examination:  GENERAL: No apparent distress.  Nontoxic. HEENT: MMM.  Vision and hearing grossly intact.  NECK: Supple.  No apparent JVD.  RESP: 97% on RA.  No IWOB.  Fair aeration bilaterally. CVS:  RRR. Heart sounds normal.  ABD/GI/GU: BS+. Abd soft, NTND.  MSK/EXT:  Moves extremities.  Deformity in RLE from recent  fracture.  BLE weakness. SKIN: no apparent skin lesion or wound NEURO: Awake, alert and oriented x4 except date.  No apparent focal neuro deficit but BLE weakness PSYCH: Calm. Normal affect.   Procedures:  None  Microbiology summarized: PFXTK-24 and influenza PCR nonreactive. MRSA PCR screen nonreactive. C. difficile and GI panel nonreactive. Blood cultures NGTD. Urine culture with 90,000 colonies of Klebsiella pneumonia and 50,000 colonies of E. coli  Assessment & Plan: AKI on CKD-3A/azotemia/none anion gap metabolic acidosis: in the setting of hypotension, diarrhea and nephrotoxic meds.  Resolving. Recent Labs    07/27/20 0426 07/28/20 0816 08/08/20 1605 08/09/20 0221 08/09/20 1849 08/10/20 0655 08/11/20 0247 08/12/20 0503 08/13/20 0727 08/14/20 0746  BUN 12 13 40* 39* 33* 32* '22 14 10 12  ' CREATININE 1.03* 1.21* 3.83* 3.49* 2.60* 2.17* 1.37* 1.33* 1.20* 1.23*  -Continue monitoring -Avoid nephrotoxic meds  Chronic diarrhea-CT abdomen and pelvis concerning for nonspecific colitis.  ESR/CRP unrevealing.  C. difficile and GI panel negative.  Fecal calprotectin elevated to 277.  GI recommended outpatient follow-up. -Continue Imodium as needed and cholestyramine 3 times daily -Outpatient follow-up with GI  Acute UTI-patient reports dysuria.  She has mild temp to 99.1.  UA concerning for UTI as well.  Urine culture as above. -Start IV ceftriaxone -Follow urine culture sensitivity  Acute metabolic encephalopathy, POA: Multifactorial including AKI, UTI and polypharmacy.  She  also has a history of TBI from cardiac arrest.  [name] oriented x4 except to date. -Treat treatable causes as above -Reorientation and delirium precautions   Hypokalemia/hypomagnesemia: Likely due to diarrhea.  Resolved. -IV magnesium sulfate 2 g x 1  Hyponatremia: Na 130.  Due to hypotonic fluid?  [name] also on Lexapro. -Recheck in the morning   Hypotension: Resolved. Hx of essential  hypertension -Continue holding home amlodipine and Lasix -As needed hydralazine   Acute urinary retention: resovled -Monitor urine output   Chronic diastolic CHF: TTE in 05/930 with LVEF of 55 to 60%, G1-DD.  On p.o. Lasix 80 mg daily at home.  Appears euvolemic on exam.  On room air. -Continue holding p.o. Lasix -Monitor fluid status and respiratory status   Anemia of chronic medical disease: H&H stable.  Hemoccult negative x2 Recent Labs    07/24/20 0211 07/25/20 0439 07/26/20 0420 07/27/20 0426 07/28/20 0430 08/08/20 1605 08/08/20 2133 08/09/20 0221 08/09/20 1849 08/14/20 0746  HGB 10.2* 8.8* 8.5* 8.9* 8.4* 7.4* 8.2* 9.4* 8.7* 8.3*  -Monitor intermittently   Chronic COPD: Stable.  Not on oxygen at home. -Continue Trelegy Ellipta, Singulair and albuterol as needed.   Schizoaffective disorder/depression/anxiety/history of TBI: Takes Valium 26m PO TID prn anxiety, Topamax 210mPO BID, Saphris 1050mL BID, Lexapro 10 mg p.o. daily, Prazosin 5 mg p.o. twice daily, 2 mg p.o. nightly, Vyvanse 20 mg daily -Continue Lexapro 10 mg p.o. daily -Continue Topamax 25 mg p.o. twice daily -Resume home Saphris and prazosin  High risk for polypharmacy: Patient is on multiple nephrotoxic and sedating medication.   GERD: continue PPI   Left adnexal cyst: Unchanged 8.4 cm left adnexal cyst on CT abdomen/pelvis on admission.   -Recommend follow-up pelvic ultrasound in 6-12 months for further surveillance.   Weakness/deconditioning/debility: patient recently hospitalized with recommendations of SNF on discharge, but she subsequently declined.  Patient refusing PT/OT evaluation and they have now signed off.  Severe malnutrition: POA Body mass index is 20.87 kg/m. Nutrition Problem: Severe Malnutrition Etiology: chronic illness Signs/Symptoms: moderate fat depletion, moderate muscle depletion, severe muscle depletion Interventions: Boost Breeze, Prostat, MVI  Pressure skin injury:  POA Pressure Injury 08/09/20 Buttocks Right;Medial Stage 2 -  Partial thickness loss of dermis presenting as a shallow open injury with a red, pink wound bed without slough. wound bed pink, over old healed wound (Active)  08/09/20 0100  Location: Buttocks  Location Orientation: Right;Medial  Staging: Stage 2 -  Partial thickness loss of dermis presenting as a shallow open injury with a red, pink wound bed without slough.  Wound Description (Comments): wound bed pink, over old healed wound  Present on Admission: Yes   DVT prophylaxis:  SCDs Start: 08/08/20 1939  Code Status: Full code Family Communication: Patient and/or RN.Therapist, sportsLevel of care: Med-Surg Status is: Inpatient  Remains inpatient appropriate because:Unsafe d/c plan, IV treatments appropriate due to intensity of illness or inability to take PO, and Inpatient level of care appropriate due to severity of illness  Dispo: The patient is from: Home              Anticipated d/c is to: Home              Patient currently is not medically stable to d/c.   Difficult to place patient No       Consultants:  None   Sch Meds:  Scheduled Meds:  (feeding supplement) PROSource Plus  30 mL Oral BID BM   asenapine  10 mg Sublingual BID   cholestyramine light  4 g Oral TID   diclofenac Sodium  4 g Topical QID   escitalopram  10 mg Oral Daily   feeding supplement  1 Container Oral TID BM   fluticasone furoate-vilanterol  1 puff Inhalation Daily   And   umeclidinium bromide  1 puff Inhalation Daily   folic acid  1 mg Oral Daily   gabapentin  300 mg Oral TID   montelukast  10 mg Oral QHS   multivitamin with minerals  1 tablet Oral Daily   naLOXone (NARCAN)  injection  0.4 mg Intravenous Once   pantoprazole  80 mg Oral Daily   prazosin  2 mg Oral QHS   saccharomyces boulardii  250 mg Oral BID   topiramate  25 mg Oral BID   Continuous Infusions:  cefTRIAXone (ROCEPHIN)  IV     PRN Meds:.acetaminophen **OR** acetaminophen,  albuterol, hydrALAZINE, loperamide, naLOXone (NARCAN)  injection, ondansetron **OR** ondansetron (ZOFRAN) IV  Antimicrobials: Anti-infectives (From admission, onward)    Start     Dose/Rate Route Frequency Ordered Stop   08/14/20 1030  cefTRIAXone (ROCEPHIN) 1 g in sodium chloride 0.9 % 100 mL IVPB        1 g 200 mL/hr over 30 Minutes Intravenous Every 24 hours 08/14/20 0937          I have personally reviewed the following labs and images: CBC: Recent Labs  Lab 08/08/20 1605 08/08/20 2133 08/09/20 0221 08/09/20 1849 08/14/20 0746  WBC 2.9*  --  3.6* 3.4* 4.0  NEUTROABS 1.5*  --   --   --   --   HGB 7.4* 8.2* 9.4* 8.7* 8.3*  HCT 24.2* 27.0* 31.2* 27.3* 26.2*  MCV 94.9  --  95.4 90.7 91.0  PLT 308  --  359 357 340   BMP &GFR Recent Labs  Lab 08/10/20 0655 08/10/20 1903 08/11/20 0247 08/12/20 0503 08/13/20 0727 08/14/20 0746  NA 138  --  139 138 136 130*  K 3.6  --  3.0* 3.8 4.1 3.9  CL 104  --  96* 97* 100 99  CO2 28  --  35* 34* 30 26  GLUCOSE 75  --  86 81 82 83  BUN 32*  --  '22 14 10 12  ' CREATININE 2.17*  --  1.37* 1.33* 1.20* 1.23*  CALCIUM 7.8*  --  7.9* 8.0* 8.6* 8.4*  MG 1.0* 2.5* 2.2 1.5* 2.0 1.7   Estimated Creatinine Clearance: 42.4 mL/min (A) (by C-G formula based on SCr of 1.23 mg/dL (H)). Liver & Pancreas: Recent Labs  Lab 08/08/20 1605 08/09/20 0221 08/09/20 1849 08/11/20 0247  AST 13* '16 16 16  ' ALT '8 9 9 9  ' ALKPHOS 118 134* 119 102  BILITOT 0.4 0.5 0.5 0.3  PROT 5.0* 5.7* 5.3* 4.2*  ALBUMIN 2.7* 3.1* 2.7* 2.4*   No results for input(s): LIPASE, AMYLASE in the last 168 hours. Recent Labs  Lab 08/08/20 1606  AMMONIA 15   Diabetic: No results for input(s): HGBA1C in the last 72 hours. Recent Labs  Lab 08/10/20 0416 08/10/20 0755 08/10/20 1124 08/10/20 1531 08/10/20 2021  GLUCAP 75 70 79 85 96   Cardiac Enzymes: Recent Labs  Lab 08/08/20 1605  CKTOTAL 81   Recent Labs    06/04/20 1140 06/15/20 1636 07/13/20 1310   PROBNP 11,232* 2,313* 201   Coagulation Profile: Recent Labs  Lab 08/08/20 1605  INR 1.4*   Thyroid Function Tests: No results  for input(s): TSH, T4TOTAL, FREET4, T3FREE, THYROIDAB in the last 72 hours. Lipid Profile: No results for input(s): CHOL, HDL, LDLCALC, TRIG, CHOLHDL, LDLDIRECT in the last 72 hours. Anemia Panel: No results for input(s): VITAMINB12, FOLATE, FERRITIN, TIBC, IRON, RETICCTPCT in the last 72 hours. Urine analysis:    Component Value Date/Time   COLORURINE YELLOW 08/13/2020 1011   APPEARANCEUR HAZY (A) 08/13/2020 1011   LABSPEC 1.006 08/13/2020 1011   PHURINE 9.0 (H) 08/13/2020 1011   GLUCOSEU NEGATIVE 08/13/2020 1011   HGBUR NEGATIVE 08/13/2020 [place] 08/13/2020 1011   BILIRUBINUR NEG 05/21/2014 1155   KETONESUR NEGATIVE 08/13/2020 1011   PROTEINUR NEGATIVE 08/13/2020 1011   UROBILINOGEN 0.2 09/30/2019 1557   NITRITE NEGATIVE 08/13/2020 1011   LEUKOCYTESUR LARGE (A) 08/13/2020 1011   Sepsis Labs: Invalid input(s): PROCALCITONIN, [place]  Microbiology: Recent Results (from the past 240 hour(s))  Blood Culture (routine x 2)     Status: None (Preliminary result)   Collection Time: 08/08/20  4:06 PM   Specimen: BLOOD  Result Value Ref Range Status   Specimen Description   Final    BLOOD LEFT ANTECUBITAL Performed at [facility] [address] [place], [place] [address]    Special Requests   Final    BOTTLES DRAWN AEROBIC AND ANAEROBIC Blood Culture results may not be optimal due to an inadequate volume of blood received in culture bottles Performed at [facility] [address], [place], [place] [address]    Culture   Final    NO GROWTH 4 DAYS Performed at [facility], [place] [address] [place], [place] [address]    Report Status PENDING  Incomplete  Resp Panel by RT-PCR (Flu A&B, Covid) Nasopharyngeal Swab     Status: None   Collection Time: 08/08/20  6:04 PM    Specimen: Nasopharyngeal Swab; Nasopharyngeal(NP) swabs in vial transport medium  Result Value Ref Range Status   SARS Coronavirus 2 by RT PCR NEGATIVE NEGATIVE Final    Comment: (NOTE) SARS-CoV-2 target nucleic acids are NOT DETECTED.  The SARS-CoV-2 RNA is generally detectable in upper respiratory specimens during the acute phase of infection. The lowest concentration of SARS-CoV-2 viral copies this assay can detect is 138 copies/mL. A negative result does not preclude SARS-Cov-2 infection and should not be used as the sole basis for treatment or other patient management decisions. A negative result may occur with  improper specimen collection/handling, submission of specimen other than nasopharyngeal swab, presence of viral mutation(s) within the areas targeted by this assay, and inadequate number of viral copies(<138 copies/mL). A negative result must be combined with clinical observations, patient history, and epidemiological information. The expected result is Negative.  Fact Sheet for Patients:  EntrepreneurPulse.com.au  Fact Sheet for Healthcare Providers:  IncredibleEmployment.be  This test is no t yet approved or cleared by the [place] FDA and  has been authorized for detection and/or diagnosis of SARS-CoV-2 by FDA under an Emergency Use Authorization (EUA). This EUA will remain  in effect (meaning this test can be used) for the duration of the COVID-19 declaration under Section 564(b)(1) of the Act, 21 U.S.C.section 360bbb-3(b)(1), unless the authorization is terminated  or revoked sooner.       Influenza A by PCR NEGATIVE NEGATIVE Final   Influenza B by PCR NEGATIVE NEGATIVE Final    Comment: (NOTE) The Xpert Xpress SARS-CoV-2/FLU/RSV plus assay is intended as an aid in the diagnosis of influenza from Nasopharyngeal swab specimens  and should not be used as a sole basis for treatment. Nasal washings and aspirates are  unacceptable for Xpert Xpress SARS-CoV-2/FLU/RSV testing.  Fact Sheet for Patients: EntrepreneurPulse.com.au  Fact Sheet for Healthcare Providers: IncredibleEmployment.be  This test is not yet approved or cleared by the [place] FDA and has been authorized for detection and/or diagnosis of SARS-CoV-2 by FDA under an Emergency Use Authorization (EUA). This EUA will remain in effect (meaning this test can be used) for the duration of the COVID-19 declaration under Section 564(b)(1) of the Act, 21 U.S.C. section 360bbb-3(b)(1), unless the authorization is terminated or revoked.  Performed at [facility], [place] [address], [place], [place] [address]   MRSA Next Gen by PCR, Nasal     Status: None   Collection Time: 08/09/20  1:14 AM   Specimen: Nasal Mucosa; Nasal Swab  Result Value Ref Range Status   MRSA by PCR Next Gen NOT DETECTED NOT DETECTED Final    Comment: (NOTE) The GeneXpert MRSA Assay (FDA approved for NASAL specimens only), is one component of a comprehensive MRSA colonization surveillance program. It is not intended to diagnose MRSA infection nor to guide or monitor treatment for MRSA infections. Test performance is not FDA approved in patients less than 3 years old. Performed at [facility], [place], [place] [address], [place], [place] [address]   Blood Culture (routine x 2)     Status: None (Preliminary result)   Collection Time: 08/09/20  2:21 AM   Specimen: BLOOD  Result Value Ref Range Status   Specimen Description   Final    BLOOD BLOOD RIGHT HAND Performed at [facility] [address] [place], [place] [address]    Special Requests   Final    BOTTLES DRAWN AEROBIC AND ANAEROBIC Blood Culture adequate volume Performed at [facility] [address], [place], [place] [address]    Culture   Final    NO GROWTH 3 DAYS Performed at [facility], [place] [address] [place], [place] [address]    Report Status PENDING  Incomplete  C Difficile Quick Screen w PCR reflex     Status: Abnormal   Collection Time: 08/09/20  2:30 AM   Specimen: STOOL  Result Value Ref Range Status   C Diff antigen NON REACTIVE (A) NEGATIVE Final   C Diff toxin NON REACTIVE (A) NEGATIVE Final   C Diff interpretation NEGATIVE  Final    Comment: Performed at [facility], [place] [address] [place], [place] [address]  Gastrointestinal Panel by PCR , Stool     Status: None   Collection Time: 08/09/20 11:08 AM   Specimen: Stool  Result Value Ref Range Status   Campylobacter species NOT DETECTED NOT DETECTED Final   Plesimonas shigelloides NOT DETECTED NOT DETECTED Final   Salmonella species NOT DETECTED NOT DETECTED Final   Yersinia enterocolitica NOT DETECTED NOT DETECTED Final   Vibrio species NOT DETECTED NOT DETECTED Final   Vibrio cholerae NOT DETECTED NOT DETECTED Final   Enteroaggregative E coli (EAEC) NOT DETECTED NOT DETECTED Final   Enteropathogenic E coli (EPEC) NOT DETECTED NOT DETECTED Final   Enterotoxigenic E coli (ETEC) NOT DETECTED NOT DETECTED Final   Shiga like toxin producing E coli (STEC) NOT DETECTED NOT DETECTED Final   Shigella/Enteroinvasive E coli (EIEC) NOT DETECTED NOT DETECTED Final   Cryptosporidium NOT DETECTED NOT DETECTED Final   Cyclospora cayetanensis NOT DETECTED NOT DETECTED Final  Entamoeba histolytica NOT DETECTED NOT DETECTED Final   Giardia lamblia NOT DETECTED NOT DETECTED Final   Adenovirus F40/41 NOT DETECTED NOT DETECTED Final   Astrovirus NOT DETECTED NOT DETECTED Final   Norovirus GI/GII NOT DETECTED NOT DETECTED Final   Rotavirus A NOT DETECTED NOT DETECTED Final   Sapovirus (I, II, IV, and V) NOT DETECTED NOT DETECTED Final    Comment: Performed at [facility], [place] [place], [place] [address]  Calprotectin, Fecal     Status: Abnormal   Collection Time: 08/10/20  1:16 PM    Specimen: Stool  Result Value Ref Range Status   Calprotectin, Fecal 277 (H) 0 - 120 ug/g Final    Comment: (NOTE) Concentration     Interpretation   Follow-Up <16 - 50 ug/g     Normal           None >50 -120 ug/g     Borderline       Re-evaluate in 4-6 weeks    >120 ug/g     Abnormal         Repeat as clinically                                   indicated Performed At: [facility] [place], [place] [id number] Rush Farmer MD QI:[id number]   Urine Culture     Status: Abnormal (Preliminary result)   Collection Time: 08/13/20 10:11 AM   Specimen: Urine, Clean Catch  Result Value Ref Range Status   Specimen Description   Final    URINE, CLEAN CATCH Performed at [facility], [place] [address] [place], [place] [address]    Special Requests   Final    NONE Performed at [facility], [place] [address], [place], [place] [address]    Culture (A)  Final    90,000 COLONIES/mL KLEBSIELLA PNEUMONIAE 50,000 COLONIES/mL ESCHERICHIA COLI SUSCEPTIBILITIES TO FOLLOW Performed at [facility], [place] [address], [place], [place] [address]    Report Status PENDING  Incomplete    Radiology Studies: No results found.    Pranav Lince T. [place]  If 7PM-7AM, please contact night-coverage [email or web address] 08/14/2020, 3:04 PM

## 2020-08-14 NOTE — TOC Transition Note (Signed)
Transition of Care [facility]) - CM/SW Discharge Note   Patient Details  Name: Tiffany [name] MRN: [id number] Date of Birth: [birth year]  Transition of [facility]) CM/SW Contact:  Dessa Phi, RN Phone Number: 08/14/2020, 10:56 AM   Clinical Narrative:there is no HHC agency to accept:[facility]. APS is active. ML:[id number],TZGYFVCBS abuse. Declines SNF.States her dr will manage her wounds. Has [employer] private duty care services. Has own transport home. MD notified.      Final next level of care: Home/Self Care Barriers to Discharge: No [facility] will accept this patient   Patient Goals and CMS Choice Patient states their goals for this hospitalization and ongoing recovery are:: to go home CMS Medicare.gov Compare Post Acute Care list provided to:: Patient    Discharge Placement                       Discharge Plan and Services   Discharge Planning Services: CM Consult Post Acute Care Choice: Resumption of Svcs/PTA Provider                               Social Determinants of Health (SDOH) Interventions     Readmission Risk Interventions Readmission Risk Prevention Plan 07/26/2020 02/23/2020  Transportation Screening Complete Complete  Medication Review Press photographer) Complete Complete  PCP or Specialist appointment within 3-5 days of discharge Complete Complete  HRI or Home Care Consult Complete Complete  SW Recovery Care/Counseling Consult Complete Complete  Palliative Care Screening Not Applicable Complete  Skilled Nursing Facility Complete Complete  Some recent data might be hidden

## 2020-08-14 NOTE — TOC Progression Note (Signed)
Transition of Care [facility]) - Progression Note    Patient Details  Name: Tiffany [name] MRN: [id number] Date of Birth: Jun 01, 1960  [facility]) CM/SW Contact  Quina Wilbourne, Juliann Pulse, RN Phone Number: 08/14/2020, 9:30 AM  Clinical Narrative:  checking into [facility] agency to accept for HHRN/PT/csw. Patient has [employer] caregivers. She declines SNF. Has own transport home.     Expected Discharge Plan: [place] Barriers to Discharge: Continued Medical Work up  Expected Discharge Plan and Services Expected Discharge Plan: [place]   Discharge Planning Services: CM Consult Post Acute Care Choice: Resumption of Svcs/PTA Provider Living arrangements for the past 2 months: Apartment                                       Social Determinants of Health (SDOH) Interventions    Readmission Risk Interventions Readmission Risk Prevention Plan 07/26/2020 02/23/2020  Transportation Screening Complete Complete  Medication Review Press photographer) Complete Complete  PCP or Specialist appointment within 3-5 days of discharge Complete Complete  HRI or Home Care Consult Complete Complete  SW Recovery Care/Counseling Consult Complete Complete  Palliative Care Screening Not Applicable Complete  Skilled Nursing Facility Complete Complete  Some recent data might be hidden

## 2020-08-15 DIAGNOSIS — N179 Acute kidney failure, unspecified: Secondary | ICD-10-CM | POA: Diagnosis not present

## 2020-08-15 DIAGNOSIS — F1911 Other psychoactive substance abuse, in remission: Secondary | ICD-10-CM | POA: Diagnosis not present

## 2020-08-15 DIAGNOSIS — G9341 Metabolic encephalopathy: Secondary | ICD-10-CM | POA: Diagnosis not present

## 2020-08-15 DIAGNOSIS — I10 Essential (primary) hypertension: Secondary | ICD-10-CM | POA: Diagnosis not present

## 2020-08-15 LAB — CBC
HCT: 27.8 % — ABNORMAL LOW (ref 36.0–46.0)
Hemoglobin: 8.7 g/dL — ABNORMAL LOW (ref 12.0–15.0)
MCH: 29 pg (ref 26.0–34.0)
MCHC: 31.3 g/dL (ref 30.0–36.0)
MCV: 92.7 fL (ref 80.0–100.0)
Platelets: 336 10*3/uL (ref 150–400)
RBC: 3 MIL/uL — ABNORMAL LOW (ref 3.87–5.11)
RDW: 15.5 % (ref 11.5–15.5)
WBC: 4.7 10*3/uL (ref 4.0–10.5)
nRBC: 0 % (ref 0.0–0.2)

## 2020-08-15 LAB — RENAL FUNCTION PANEL
Albumin: 2.5 g/dL — ABNORMAL LOW (ref 3.5–5.0)
Anion gap: 7 (ref 5–15)
BUN: 13 mg/dL (ref 8–23)
CO2: 22 mmol/L (ref 22–32)
Calcium: 8.1 mg/dL — ABNORMAL LOW (ref 8.9–10.3)
Chloride: 103 mmol/L (ref 98–111)
Creatinine, Ser: 1.28 mg/dL — ABNORMAL HIGH (ref 0.44–1.00)
GFR, Estimated: 48 mL/min — ABNORMAL LOW (ref >60)
Glucose, Bld: 81 mg/dL (ref 70–99)
Phosphorus: 4.6 mg/dL (ref 2.5–4.6)
Potassium: 3.9 mmol/L (ref 3.5–5.1)
Sodium: 132 mmol/L — ABNORMAL LOW (ref 135–145)

## 2020-08-15 LAB — URINE CULTURE: Culture: 90000 — AB

## 2020-08-15 LAB — MAGNESIUM: Magnesium: 1.7 mg/dL (ref 1.7–2.4)

## 2020-08-15 MED ORDER — CEPHALEXIN 500 MG PO CAPS: 500.0000 mg | ORAL_CAPSULE | Freq: Three times a day (TID) | ORAL | 0 refills | Status: AC

## 2020-08-15 MED ORDER — COLCHICINE 0.6 MG PO TABS: 0.6000 mg | ORAL_TABLET | Freq: Every day | ORAL | Status: DC

## 2020-08-15 MED ORDER — AMLODIPINE BESYLATE 5 MG PO TABS
5.0000 mg | ORAL_TABLET | Freq: Every day | ORAL | Status: DC
  Administered 2020-08-15 – 2020-08-16 (×2): 5 mg via ORAL
  Filled 2020-08-15 (×2): qty 1

## 2020-08-15 MED ORDER — ALBUTEROL SULFATE HFA 108 (90 BASE) MCG/ACT IN AERS
2.0000 | INHALATION_SPRAY | RESPIRATORY_TRACT | Status: DC | PRN
  Administered 2020-08-15 – 2020-08-16 (×2): 2 via RESPIRATORY_TRACT
  Filled 2020-08-15: qty 6.7

## 2020-08-15 MED ORDER — ADULT MULTIVITAMIN W/MINERALS CH: 1.0000 | ORAL_TABLET | Freq: Every day | ORAL | Status: DC

## 2020-08-15 NOTE — Progress Notes (Signed)
   08/15/20 2146  Vitals  Temp (!) 100.5 F (38.1 C)  Temp Source Oral  MEWS COLOR  MEWS Score Color Green  MEWS Score  MEWS Temp 1  MEWS Systolic 0  MEWS Pulse 0  MEWS RR 0  MEWS LOC 0  MEWS Score 1    Pt made aware that temperature decreased with removal of blankets and decreasing the temp in the room. Pt agreed to keep covers off, allowed for cold wash cloth behind neck and for the room to cool down. Educated and instructed patient on use of incentive spirometer. Pt refused teaching and refused to use. Will recheck temp at 0000. Will continue to monitor for other s/s of infection.

## 2020-08-15 NOTE — Progress Notes (Signed)
   08/15/20 2128  Vitals  Temp (!) 102.2 F (39 C) (Trustin Chapa, RN notified)  Temp Source Oral  BP (!) 142/97  MAP (mmHg) 109  BP Location Left Arm  BP Method Automatic  Patient Position (if appropriate) Lying  Pulse Rate 82  Resp 19  MEWS COLOR  MEWS Score Color Yellow  Oxygen Therapy  SpO2 96 %  O2 Device Room Air  MEWS Score  MEWS Temp 2  MEWS Systolic 0  MEWS Pulse 0  MEWS RR 0  MEWS LOC 0  MEWS Score 2    Pts room hot per preference with 5 blankets on. Blankets removed and, temp turned down and door left open. Made patient aware of indication. Will recheck temp with cooler temp in room

## 2020-08-15 NOTE — Progress Notes (Signed)
PROGRESS NOTE  Tiffany [name] JQZ:[id number] DOB: Mar 22, 1960   PCP: [name], MD  Patient is from: Home.  Lives alone.  Dependent for most ADL's.  Has home aide daily.  DOA: 08/08/2020 LOS: 7  Chief complaints:  Chief Complaint  Patient presents with   Hypotension     Brief Narrative / Interim history: 60 year old F with PMH of COPD not on O2, diastolic CHF, ABI after cardiac arrest in 2018, CKD-3A, AICD, HTN, schizoaffective disorder, polysubstance use and debility presenting with generalized weakness and lethargy, and admitted for AKI, hypotension and acute metabolic encephalopathy.  Hypotension resolved with IV fluid resuscitation.  AKI improved.  Encephalopathy seems to have resolved as well.  Started having dysuria.  UA concerning for UTI.  Started on IV ceftriaxone.  Therapy recommended SNF but patient prefers to go home.    Subjective: Seen and examined earlier this morning.  No major events overnight of this morning.  [name] somewhat sleepy but wakes to voice easily.  [name] oriented x4 except date.  Complains of bilateral lower extremity pain.  She describes the pain as burning.  She denies chest pain, dyspnea, GI or UTI symptoms.  Objective: Vitals:   08/14/20 2141 08/15/20 0014 08/15/20 0645 08/15/20 0842  BP: (!) 163/114 (!) 155/87 (!) 157/99   Pulse: 79  77   Resp: 18  15   Temp: 99.8 F (37.7 C)  98.2 F (36.8 C)   TempSrc: Oral  Axillary   SpO2: 98%  99% 95%  Weight:   55 kg     Intake/Output Summary (Last 24 hours) at 08/15/2020 1501 Last data filed at 08/15/2020 0647 Gross per 24 hour  Intake 100 ml  Output 1200 ml  Net -1100 ml   Filed Weights   08/13/20 0500 08/14/20 0418 08/15/20 0645  Weight: 55.7 kg 56 kg 55 kg    Examination:  GENERAL: No apparent distress.  Nontoxic. HEENT: MMM.  Vision and hearing grossly intact.  NECK: Supple.  No apparent JVD.  RESP: 99% on RA.  No IWOB.  Fair aeration bilaterally. CVS:  RRR. Heart sounds normal.   ABD/GI/GU: BS+. Abd soft, NTND.  MSK/EXT:  Moves extremities. No apparent deformity. No edema.  SKIN: Stage II sacral decubitus ulcer per RN report. NEURO: Sleepy but wakes to voice easily.  Oriented x4 except date.  BLE weakness (2/5). PSYCH: Calm. Normal affect.   Procedures:  None  Microbiology summarized: [id number] and influenza PCR nonreactive. MRSA PCR screen nonreactive. C. difficile and GI panel nonreactive. Blood cultures NGTD. Urine culture with 90,000 colonies of Klebsiella pneumonia and 50,000 colonies of E. Coli.  Both sensitive to ceftriaxone and Keflex.  Assessment & Plan: AKI on CKD-3A/azotemia/none anion gap metabolic acidosis: in the setting of hypotension, diarrhea and nephrotoxic meds.  Resolving. Recent Labs    07/28/20 0816 08/08/20 1605 08/09/20 0221 08/09/20 1849 08/10/20 0655 08/11/20 0247 08/12/20 0503 08/13/20 0727 08/14/20 0746 08/15/20 0804  BUN 13 40* 39* 33* 32* '22 14 10 12 13  ' CREATININE 1.21* 3.83* 3.49* 2.60* 2.17* 1.37* 1.33* 1.20* 1.23* 1.28*  -Continue monitoring -Avoid nephrotoxic meds  Chronic diarrhea-CT abdomen and pelvis concerning for nonspecific colitis.  ESR/CRP unrevealing.  C. difficile and GI panel negative.  Fecal calprotectin elevated to 277.  GI recommended outpatient follow-up. -Continue Imodium as needed and cholestyramine 3 times daily -Outpatient follow-up with GI  Acute UTI-patient had dysuria and mild temp.  Urine culture as above. -Continue IV ceftriaxone while in-house -Will discharge on  p.o. Keflex.  Acute metabolic encephalopathy, POA: Multifactorial including AKI, UTI and polypharmacy.  She also has a history of TBI from cardiac arrest.  [name] oriented x4 except to date. -Treat treatable causes as above -Reorientation and delirium precautions   Hypokalemia/hypomagnesemia: Likely due to diarrhea.  Resolved.  Hyponatremia: Na 130> 132.  Due to hypotonic fluid?  [name] also on Lexapro. -Recheck in the  morning   Hypotension: Resolved. Hx of essential hypertension -Continue holding home amlodipine and Lasix -As needed hydralazine   Acute urinary retention: resovled -Monitor urine output   Chronic diastolic CHF: TTE in 05/6977 with LVEF of 55 to 60%, G1-DD.  On p.o. Lasix 80 mg daily at home.  Appears euvolemic on exam.  On room air. -Continue holding p.o. Lasix -Monitor fluid status and respiratory status   Anemia of chronic medical disease: H&H stable.  FOBT negative x2 Recent Labs    07/25/20 0439 07/26/20 0420 07/27/20 0426 07/28/20 0430 08/08/20 1605 08/08/20 2133 08/09/20 0221 08/09/20 1849 08/14/20 0746 08/15/20 0804  HGB 8.8* 8.5* 8.9* 8.4* 7.4* 8.2* 9.4* 8.7* 8.3* 8.7*  -Monitor intermittently   Chronic COPD: Stable.  Not on oxygen at home. -Continue Trelegy Ellipta, Singulair and albuterol as needed.   Schizoaffective disorder/depression/anxiety/history of TBI: Takes Valium 50m PO TID prn anxiety, Topamax 228mPO BID, Saphris 1069mL BID, Lexapro 10 mg p.o. daily, Prazosin 5 mg p.o. twice daily, 2 mg p.o. nightly, Vyvanse 20 mg daily -Continue Lexapro 10 mg p.o. daily -Continue Topamax 25 mg p.o. twice daily -Continue home Saphris and prazosin  High risk for polypharmacy: Patient is on multiple nephrotoxic and sedating medication.   GERD: continue PPI   Left adnexal cyst: Unchanged 8.4 cm left adnexal cyst on CT abdomen/pelvis on admission.   -Repeat pelvic US [name] 6-12 months for further surveillance.   Weakness/deconditioning/debility: patient recently hospitalized with recommendations of SNF on discharge, but she subsequently declined.  Patient refusing PT/OT evaluation and they have now signed off.  Patient has fair insight and has capacity to make medical decision.  Severe malnutrition: POA Body mass index is 20.49 kg/m. Nutrition Problem: Severe Malnutrition Etiology: chronic illness Signs/Symptoms: moderate fat depletion, moderate muscle depletion,  severe muscle depletion Interventions: Boost Breeze, Prostat, MVI  Pressure skin injury: POA Pressure Injury 08/09/20 Buttocks Right;Medial Stage 2 -  Partial thickness loss of dermis presenting as a shallow open injury with a red, pink wound bed without slough. wound bed pink, over old healed wound (Active)  08/09/20 0100  Location: Buttocks  Location Orientation: Right;Medial  Staging: Stage 2 -  Partial thickness loss of dermis presenting as a shallow open injury with a red, pink wound bed without slough.  Wound Description (Comments): wound bed pink, over old healed wound  Present on Admission: Yes   DVT prophylaxis:  SCDs Start: 08/08/20 1939  Code Status: Full code Family Communication: Patient and/or RN.Therapist, sportsLevel of care: Med-Surg Status is: Inpatient  Remains inpatient appropriate because:Unsafe d/c plan, IV treatments appropriate due to intensity of illness or inability to take PO, and Inpatient level of care appropriate due to severity of illness  Dispo: The patient is from: Home              Anticipated d/c is to: Home              Patient currently is not medically stable to d/c.   Difficult to place patient No       Consultants:  None  Sch Meds:  Scheduled Meds:  (feeding supplement) PROSource Plus  30 mL Oral BID BM   amLODipine  5 mg Oral Daily   asenapine  5 mg Sublingual BID   cholestyramine light  4 g Oral TID   diclofenac Sodium  4 g Topical QID   escitalopram  10 mg Oral Daily   feeding supplement  1 Container Oral TID BM   fluticasone furoate-vilanterol  1 puff Inhalation Daily   And   umeclidinium bromide  1 puff Inhalation Daily   folic acid  1 mg Oral Daily   gabapentin  300 mg Oral TID   montelukast  10 mg Oral QHS   multivitamin with minerals  1 tablet Oral Daily   naLOXone (NARCAN)  injection  0.4 mg Intravenous Once   pantoprazole  80 mg Oral Daily   prazosin  2 mg Oral QHS   saccharomyces boulardii  250 mg Oral BID   topiramate  25  mg Oral BID   Continuous Infusions:  cefTRIAXone (ROCEPHIN)  IV 1 g (08/15/20 1000)   PRN Meds:.acetaminophen **OR** acetaminophen, albuterol, hydrALAZINE, loperamide, naLOXone (NARCAN)  injection, ondansetron **OR** ondansetron (ZOFRAN) IV  Antimicrobials: Anti-infectives (From admission, onward)    Start     Dose/Rate Route Frequency Ordered Stop   08/16/20 0000  cephALEXin (KEFLEX) 500 MG capsule        500 mg Oral 3 times daily 08/15/20 1051 08/20/20 2359   08/14/20 1030  cefTRIAXone (ROCEPHIN) 1 g in sodium chloride 0.9 % 100 mL IVPB        1 g 200 mL/hr over 30 Minutes Intravenous Every 24 hours 08/14/20 0937          I have personally reviewed the following labs and images: CBC: Recent Labs  Lab 08/08/20 1605 08/08/20 2133 08/09/20 0221 08/09/20 1849 08/14/20 0746 08/15/20 0804  WBC 2.9*  --  3.6* 3.4* 4.0 4.7  NEUTROABS 1.5*  --   --   --   --   --   HGB 7.4* 8.2* 9.4* 8.7* 8.3* 8.7*  HCT 24.2* 27.0* 31.2* 27.3* 26.2* 27.8*  MCV 94.9  --  95.4 90.7 91.0 92.7  PLT 308  --  359 357 340 336   BMP &GFR Recent Labs  Lab 08/11/20 0247 08/12/20 0503 08/13/20 0727 08/14/20 0746 08/15/20 0804  NA 139 138 136 130* 132*  K 3.0* 3.8 4.1 3.9 3.9  CL 96* 97* 100 99 103  CO2 35* 34* '30 26 22  ' GLUCOSE 86 81 82 83 81  BUN '22 14 10 12 13  ' CREATININE 1.37* 1.33* 1.20* 1.23* 1.28*  CALCIUM 7.9* 8.0* 8.6* 8.4* 8.1*  MG 2.2 1.5* 2.0 1.7 1.7  PHOS  --   --   --   --  4.6   Estimated Creatinine Clearance: 40.1 mL/min (A) (by C-G formula based on SCr of 1.28 mg/dL (H)). Liver & Pancreas: Recent Labs  Lab 08/08/20 1605 08/09/20 0221 08/09/20 1849 08/11/20 0247 08/15/20 0804  AST 13* '16 16 16  ' --   ALT '8 9 9 9  ' --   ALKPHOS 118 134* 119 102  --   BILITOT 0.4 0.5 0.5 0.3  --   PROT 5.0* 5.7* 5.3* 4.2*  --   ALBUMIN 2.7* 3.1* 2.7* 2.4* 2.5*   No results for input(s): LIPASE, AMYLASE in the last 168 hours. Recent Labs  Lab 08/08/20 1606  AMMONIA 15    Diabetic: No results for input(s): HGBA1C in the last 72  hours. Recent Labs  Lab 08/10/20 0416 08/10/20 0755 08/10/20 1124 08/10/20 1531 08/10/20 2021  GLUCAP 75 70 79 85 96   Cardiac Enzymes: Recent Labs  Lab 08/08/20 1605  CKTOTAL 81   Recent Labs    06/04/20 1140 06/15/20 1636 07/13/20 1310  PROBNP 11,232* 2,313* 201   Coagulation Profile: Recent Labs  Lab 08/08/20 1605  INR 1.4*   Thyroid Function Tests: No results for input(s): TSH, T4TOTAL, FREET4, T3FREE, THYROIDAB in the last 72 hours. Lipid Profile: No results for input(s): CHOL, HDL, LDLCALC, TRIG, CHOLHDL, LDLDIRECT in the last 72 hours. Anemia Panel: No results for input(s): VITAMINB12, FOLATE, FERRITIN, TIBC, IRON, RETICCTPCT in the last 72 hours. Urine analysis:    Component Value Date/Time   COLORURINE YELLOW 08/13/2020 1011   APPEARANCEUR HAZY (A) 08/13/2020 1011   LABSPEC 1.006 08/13/2020 1011   PHURINE 9.0 (H) 08/13/2020 1011   GLUCOSEU NEGATIVE 08/13/2020 1011   HGBUR NEGATIVE 08/13/2020 [place] 08/13/2020 1011   BILIRUBINUR NEG 05/21/2014 1155   KETONESUR NEGATIVE 08/13/2020 1011   PROTEINUR NEGATIVE 08/13/2020 1011   UROBILINOGEN 0.2 09/30/2019 1557   NITRITE NEGATIVE 08/13/2020 1011   LEUKOCYTESUR LARGE (A) 08/13/2020 1011   Sepsis Labs: Invalid input(s): PROCALCITONIN, [place]  Microbiology: Recent Results (from the past 240 hour(s))  Blood Culture (routine x 2)     Status: None   Collection Time: 08/08/20  4:06 PM   Specimen: BLOOD  Result Value Ref Range Status   Specimen Description   Final    BLOOD LEFT ANTECUBITAL Performed at [facility] [address] [place], [place] [address]    Special Requests   Final    BOTTLES DRAWN AEROBIC AND ANAEROBIC Blood Culture results may not be optimal due to an inadequate volume of blood received in culture bottles Performed at [facility] [address]  [place], [place] [address]    Culture   Final    NO GROWTH 6 DAYS Performed at [facility], [place] [address], [place], [place] [address]    Report Status 08/14/2020 FINAL  Final  Resp Panel by RT-PCR (Flu A&B, Covid) Nasopharyngeal Swab     Status: None   Collection Time: 08/08/20  6:04 PM   Specimen: Nasopharyngeal Swab; Nasopharyngeal(NP) swabs in vial transport medium  Result Value Ref Range Status   SARS Coronavirus 2 by RT PCR NEGATIVE NEGATIVE Final    Comment: (NOTE) SARS-CoV-2 target nucleic acids are NOT DETECTED.  The SARS-CoV-2 RNA is generally detectable in upper respiratory specimens during the acute phase of infection. The lowest concentration of SARS-CoV-2 viral copies this assay can detect is 138 copies/mL. A negative result does not preclude SARS-Cov-2 infection and should not be used as the sole basis for treatment or other patient management decisions. A negative result may occur with  improper specimen collection/handling, submission of specimen other than nasopharyngeal swab, presence of viral mutation(s) within the areas targeted by this assay, and inadequate number of viral copies(<138 copies/mL). A negative result must be combined with clinical observations, patient history, and epidemiological information. The expected result is Negative.  Fact Sheet for Patients:  EntrepreneurPulse.com.au  Fact Sheet for Healthcare Providers:  IncredibleEmployment.be  This test is no t yet approved or cleared by the [place] FDA and  has been authorized for detection and/or diagnosis of SARS-CoV-2 by FDA under an Emergency Use Authorization (EUA). This EUA will remain  in effect (meaning this test can be used)  for the duration of the COVID-19 declaration under Section 564(b)(1) of the Act, 21 U.S.C.section 360bbb-3(b)(1), unless the authorization is terminated  or revoked sooner.       Influenza A by PCR NEGATIVE NEGATIVE  Final   Influenza B by PCR NEGATIVE NEGATIVE Final    Comment: (NOTE) The Xpert Xpress SARS-CoV-2/FLU/RSV plus assay is intended as an aid in the diagnosis of influenza from Nasopharyngeal swab specimens and should not be used as a sole basis for treatment. Nasal washings and aspirates are unacceptable for Xpert Xpress SARS-CoV-2/FLU/RSV testing.  Fact Sheet for Patients: EntrepreneurPulse.com.au  Fact Sheet for Healthcare Providers: IncredibleEmployment.be  This test is not yet approved or cleared by the [place] FDA and has been authorized for detection and/or diagnosis of SARS-CoV-2 by FDA under an Emergency Use Authorization (EUA). This EUA will remain in effect (meaning this test can be used) for the duration of the COVID-19 declaration under Section 564(b)(1) of the Act, 21 U.S.C. section 360bbb-3(b)(1), unless the authorization is terminated or revoked.  Performed at [facility], [place] [address] [place], [place] [address]   MRSA Next Gen by PCR, Nasal     Status: None   Collection Time: 08/09/20  1:14 AM   Specimen: Nasal Mucosa; Nasal Swab  Result Value Ref Range Status   MRSA by PCR Next Gen NOT DETECTED NOT DETECTED Final    Comment: (NOTE) The GeneXpert MRSA Assay (FDA approved for NASAL specimens only), is one component of a comprehensive MRSA colonization surveillance program. It is not intended to diagnose MRSA infection nor to guide or monitor treatment for MRSA infections. Test performance is not FDA approved in patients less than 53 years old. Performed at [facility], [place] [address], [place], [place] [address]   Blood Culture (routine x 2)     Status: None   Collection Time: 08/09/20  2:21 AM   Specimen: BLOOD  Result Value Ref Range Status   Specimen Description   Final    BLOOD BLOOD RIGHT HAND Performed at [facility] [address], [place],  [place] [address]    Special Requests   Final    BOTTLES DRAWN AEROBIC AND ANAEROBIC Blood Culture adequate volume Performed at [facility] [address], [place], [name] [address]    Culture   Final    NO GROWTH 5 DAYS Performed at [facility], [place] [address], [place], [place] [address]    Report Status 08/14/2020 FINAL  Final  C Difficile Quick Screen w PCR reflex     Status: Abnormal   Collection Time: 08/09/20  2:30 AM   Specimen: STOOL  Result Value Ref Range Status   C Diff antigen NON REACTIVE (A) NEGATIVE Final   C Diff toxin NON REACTIVE (A) NEGATIVE Final   C Diff interpretation NEGATIVE  Final    Comment: Performed at [facility], [place] [address] [place],  [address]  Gastrointestinal Panel by PCR , Stool     Status: None   Collection Time: 08/09/20 11:08 AM   Specimen: Stool  Result Value Ref Range Status   Campylobacter species NOT DETECTED NOT DETECTED Final   Plesimonas shigelloides NOT DETECTED NOT DETECTED Final   Salmonella species NOT DETECTED NOT DETECTED Final   Yersinia enterocolitica NOT DETECTED NOT DETECTED Final   Vibrio species NOT DETECTED NOT DETECTED Final   Vibrio cholerae NOT DETECTED NOT DETECTED Final   Enteroaggregative E coli (EAEC)  NOT DETECTED NOT DETECTED Final   Enteropathogenic E coli (EPEC) NOT DETECTED NOT DETECTED Final   Enterotoxigenic E coli (ETEC) NOT DETECTED NOT DETECTED Final   Shiga like toxin producing E coli (STEC) NOT DETECTED NOT DETECTED Final   Shigella/Enteroinvasive E coli (EIEC) NOT DETECTED NOT DETECTED Final   Cryptosporidium NOT DETECTED NOT DETECTED Final   Cyclospora cayetanensis NOT DETECTED NOT DETECTED Final   Entamoeba histolytica NOT DETECTED NOT DETECTED Final   Giardia lamblia NOT DETECTED NOT DETECTED Final   Adenovirus F40/41 NOT DETECTED NOT DETECTED Final   Astrovirus NOT DETECTED NOT DETECTED Final   Norovirus GI/GII NOT DETECTED NOT DETECTED Final    Rotavirus A NOT DETECTED NOT DETECTED Final   Sapovirus (I, II, IV, and V) NOT DETECTED NOT DETECTED Final    Comment: Performed at [facility], [place] [place], [place] [address]  Calprotectin, Fecal     Status: Abnormal   Collection Time: 08/10/20  1:16 PM   Specimen: Stool  Result Value Ref Range Status   Calprotectin, Fecal 277 (H) 0 - 120 ug/g Final    Comment: (NOTE) Concentration     Interpretation   Follow-Up <16 - 50 ug/g     Normal           None >50 -120 ug/g     Borderline       Re-evaluate in 4-6 weeks    >120 ug/g     Abnormal         Repeat as clinically                                   indicated Performed At: [facility] [place], [place] [id number] Rush Farmer MD JZ:[id number]   Urine Culture     Status: Abnormal   Collection Time: 08/13/20 10:11 AM   Specimen: Urine, Clean Catch  Result Value Ref Range Status   Specimen Description   Final    URINE, CLEAN CATCH Performed at [facility], [place] [address] [place], [place] [address]    Special Requests   Final    NONE Performed at [facility], [place] [address], [place], [place] [address]    Culture (A)  Final    90,000 COLONIES/mL KLEBSIELLA PNEUMONIAE 50,000 COLONIES/mL ESCHERICHIA COLI    Report Status 08/15/2020 FINAL  Final   Organism ID, Bacteria KLEBSIELLA PNEUMONIAE (A)  Final   Organism ID, Bacteria ESCHERICHIA COLI (A)  Final      Susceptibility   Escherichia coli - MIC*    AMPICILLIN >=32 RESISTANT Resistant     CEFAZOLIN <=4 SENSITIVE Sensitive     CEFEPIME <=0.12 SENSITIVE Sensitive     CEFTRIAXONE <=0.25 SENSITIVE Sensitive     CIPROFLOXACIN 1 SENSITIVE Sensitive     GENTAMICIN <=1 SENSITIVE Sensitive     IMIPENEM <=0.25 SENSITIVE Sensitive     NITROFURANTOIN <=16 SENSITIVE Sensitive     TRIMETH/SULFA >=320 RESISTANT Resistant     AMPICILLIN/SULBACTAM 4 SENSITIVE Sensitive     PIP/TAZO <=4 SENSITIVE Sensitive      * 50,000 COLONIES/mL ESCHERICHIA COLI   Klebsiella pneumoniae - MIC*    AMPICILLIN RESISTANT Resistant     CEFAZOLIN <=4 SENSITIVE Sensitive     CEFEPIME <=0.12 SENSITIVE Sensitive     CEFTRIAXONE <=0.25 SENSITIVE Sensitive     CIPROFLOXACIN <=0.25 SENSITIVE Sensitive     GENTAMICIN <=1 SENSITIVE  Sensitive     IMIPENEM <=0.25 SENSITIVE Sensitive     NITROFURANTOIN 128 RESISTANT Resistant     TRIMETH/SULFA <=20 SENSITIVE Sensitive     AMPICILLIN/SULBACTAM 4 SENSITIVE Sensitive     PIP/TAZO <=4 SENSITIVE Sensitive     * 90,000 COLONIES/mL KLEBSIELLA PNEUMONIAE    Radiology Studies: No results found.    Corliss Lamartina T. [place]  If 7PM-7AM, please contact night-coverage [email or web address] 08/15/2020, 3:01 PM

## 2020-08-16 DIAGNOSIS — I959 Hypotension, unspecified: Secondary | ICD-10-CM

## 2020-08-16 DIAGNOSIS — J9611 Chronic respiratory failure with hypoxia: Secondary | ICD-10-CM

## 2020-08-16 DIAGNOSIS — N179 Acute kidney failure, unspecified: Secondary | ICD-10-CM | POA: Diagnosis not present

## 2020-08-16 DIAGNOSIS — R531 Weakness: Secondary | ICD-10-CM

## 2020-08-16 DIAGNOSIS — R319 Hematuria, unspecified: Secondary | ICD-10-CM

## 2020-08-16 DIAGNOSIS — L98429 Non-pressure chronic ulcer of back with unspecified severity: Secondary | ICD-10-CM

## 2020-08-16 DIAGNOSIS — G9341 Metabolic encephalopathy: Secondary | ICD-10-CM | POA: Diagnosis not present

## 2020-08-16 LAB — RENAL FUNCTION PANEL
Albumin: 2.5 g/dL — ABNORMAL LOW (ref 3.5–5.0)
Anion gap: 6 (ref 5–15)
BUN: 15 mg/dL (ref 8–23)
CO2: 21 mmol/L — ABNORMAL LOW (ref 22–32)
Calcium: 8.1 mg/dL — ABNORMAL LOW (ref 8.9–10.3)
Chloride: 107 mmol/L (ref 98–111)
Creatinine, Ser: 1.21 mg/dL — ABNORMAL HIGH (ref 0.44–1.00)
GFR, Estimated: 51 mL/min — ABNORMAL LOW (ref >60)
Glucose, Bld: 77 mg/dL (ref 70–99)
Phosphorus: 4.4 mg/dL (ref 2.5–4.6)
Potassium: 4 mmol/L (ref 3.5–5.1)
Sodium: 134 mmol/L — ABNORMAL LOW (ref 135–145)

## 2020-08-16 LAB — CBC WITH DIFFERENTIAL/PLATELET
Abs Immature Granulocytes: 0.02 10*3/uL (ref 0.00–0.07)
Basophils Absolute: 0 10*3/uL (ref 0.0–0.1)
Basophils Relative: 1 %
Eosinophils Absolute: 0 10*3/uL (ref 0.0–0.5)
Eosinophils Relative: 0 %
HCT: 28.5 % — ABNORMAL LOW (ref 36.0–46.0)
Hemoglobin: 8.7 g/dL — ABNORMAL LOW (ref 12.0–15.0)
Immature Granulocytes: 1 %
Lymphocytes Relative: 33 %
Lymphs Abs: 1.3 10*3/uL (ref 0.7–4.0)
MCH: 28.3 pg (ref 26.0–34.0)
MCHC: 30.5 g/dL (ref 30.0–36.0)
MCV: 92.8 fL (ref 80.0–100.0)
Monocytes Absolute: 0.3 10*3/uL (ref 0.1–1.0)
Monocytes Relative: 7 %
Neutro Abs: 2.3 10*3/uL (ref 1.7–7.7)
Neutrophils Relative %: 58 %
Platelets: 375 10*3/uL (ref 150–400)
RBC: 3.07 MIL/uL — ABNORMAL LOW (ref 3.87–5.11)
RDW: 15.6 % — ABNORMAL HIGH (ref 11.5–15.5)
WBC: 3.8 10*3/uL — ABNORMAL LOW (ref 4.0–10.5)
nRBC: 0 % (ref 0.0–0.2)

## 2020-08-16 LAB — MAGNESIUM: Magnesium: 1.9 mg/dL (ref 1.7–2.4)

## 2020-08-16 NOTE — Discharge Summary (Signed)
Physician Discharge Summary  Tiffany [name] TDV:[id number] DOB: 08-23-1960 DOA: 08/08/2020  PCP: Tiffany Rakes, MD  Admit date: 08/08/2020 Discharge date: 08/16/2020  Admitted From: Home Disposition: Home  Recommendations for Outpatient Follow-up:  Follow ups as below. Please obtain CBC/BMP/Mag at follow up Please follow up on the following pending results: None  Home Health: [facility] PT/OT/RN ordered but not able to find willing [facility] agency.  Patient has daily personal care at home Equipment/Devices: Patient has appropriate DME's at home  Discharge Condition: Stable CODE STATUS: Full code   Follow-up Information     Tiffany Rakes, MD. Schedule an appointment as soon as possible for a visit in 1 week(s).   Specialty: Family Medicine Contact information: [address] [place] [address] [phone]                 Hospital Course: 60 year old F with PMH of COPD not on O2, diastolic CHF, ABI after cardiac arrest in 2018, CKD-3A, AICD, HTN, schizoaffective disorder, polysubstance use and debility presenting with generalized weakness and lethargy, and admitted for AKI, hypotension and acute metabolic encephalopathy.  Hypotension resolved with IV fluid resuscitation.  AKI improved.  Encephalopathy seems to have resolved as well.  Started having dysuria.  UA concerning for UTI.  Started on IV ceftriaxone.  Urine culture with 90,000 colonies of Klebsiella pneumonia and 50,000 colonies of E. Coli.  Both sensitive to ceftriaxone and Keflex.  [name] discharged on p.o. Keflex to complete treatment course.  Therapy recommended SNF but patient refused SNF and chose to go home with her personal care.  Patient has capacity to make medical decision.   See individual problem list below for more on hospital course.  Discharge Diagnoses:  AKI on CKD-3A/azotemia/none anion gap metabolic acidosis: in the setting of hypotension, diarrhea and nephrotoxic meds.  Hypotension, diarrhea, AKI  and azotemia resolved Recent Labs    08/08/20 1605 08/09/20 0221 08/09/20 1849 08/10/20 0655 08/11/20 0247 08/12/20 0503 08/13/20 0727 08/14/20 0746 08/15/20 0804 08/16/20 0431  BUN 40* 39* 33* 32* _0 CREATININE 3.83* 3.49* 2.60* 2.17* 1.37* 1.33* 1.20* 1.23* 1.28* 1.21*  -Recheck renal function in 1 week   Chronic diarrhea-CT abdomen and pelvis concerning for nonspecific colitis.  ESR/CRP unrevealing.  C. difficile and GI panel negative.  Fecal calprotectin elevated to 277.  GI recommended outpatient follow-up. -Outpatient follow-up with GI   Acute UTI-patient has dysuria and mild temp.  Urine culture as above. -Received IV ceftriaxone for 3 days and discharged on p.o. Keflex for 4 more days.   Acute metabolic encephalopathy, POA: Multifactorial including AKI, UTI and polypharmacy. [name] oriented x4 and has fair insight.  Patient is on multiple psychiatric medications with potential for sedation and CNS polypharmacy. -Stopped Zanaflex moving forward.  Recommended med review with her psychiatrist and PCP.   Hypokalemia/hypomagnesemia: Likely due to diarrhea.  Resolved.   Hyponatremia: Na 130> 132> 134 -Recheck renal function at follow-up.   Hypotension: Resolved. Hx of essential hypertension -Continue home medications   Acute urinary retention: resovled   Chronic diastolic CHF: TTE in 0/0938 with LVEF of 55 to 60%, G1-DD.  Euvolemic. -Continue home diuretics   Anemia of chronic medical disease: H&H stable.  FOBT negative x2 -Recheck CBC at follow-up.   Chronic COPD: Stable.  Not on oxygen at home. -Continue Trelegy Ellipta, Singulair and albuterol as needed.   Schizoaffective disorder/depression/anxiety/history of TBI:  High risk for polypharmacy: On Valium 26m PO TID prn  anxiety, Topamax 34m PO BID, Saphris 153mSL BID, Lexapro 10 mg p.o. daily, Prazosin 5 mg p.o. twice daily, 2 mg p.o. nightly, Vyvanse 20 mg daily -Encouraged to discuss her meds  with her psychiatrist.  Meanwhile, discontinued Zanaflex.   GERD: continue PPI   Left adnexal cyst: Unchanged 8.4 cm left adnexal cyst on CT abdomen/pelvis on admission.   -Repeat pelvic USKorean 6-12 months for further surveillance.   Weakness/deconditioning/debility: patient declined SNF.  She has done the same in the past.  She clearly has capacity to make medical decision although she might be making poor decision.    Severe malnutrition: POA Body mass index is 20.57 kg/m. Nutrition Problem: Severe Malnutrition Etiology: chronic illness Signs/Symptoms: moderate fat depletion, moderate muscle depletion, severe muscle depletion Interventions: Boost Breeze, Prostat, MVI  Pressure skin injury: POA Pressure Injury 08/09/20 Buttocks Right;Medial Stage 2 -  Partial thickness loss of dermis presenting as a shallow open injury with a red, pink wound bed without slough. wound bed pink, over old healed wound (Active)  08/09/20 0100  Location: Buttocks  Location Orientation: Right;Medial  Staging: Stage 2 -  Partial thickness loss of dermis presenting as a shallow open injury with a red, pink wound bed without slough.  Wound Description (Comments): wound bed pink, over old healed wound  Present on Admission: Yes    Discharge Exam: Vitals:   08/16/20 0740 08/16/20 1324  BP:  138/89  Pulse:  74  Resp:  14  Temp:  97.9 F (36.6 C)  SpO2: 98% 99%    GENERAL: No apparent distress.  Nontoxic. HEENT: MMM.  Vision and hearing grossly intact.  NECK: Supple.  No apparent JVD.  RESP: On RA.  No IWOB.  Fair aeration bilaterally. CVS:  RRR. Heart sounds normal.  ABD/GI/GU: Bowel sounds present. Soft. Non tender.  MSK/EXT: BLE weakness.  Barely moves her lower extremities. SKIN: no apparent skin lesion or wound NEURO: Awake, alert and oriented x4.  No apparent focal neuro deficit other than chronic BLE weakness PSYCH: Calm. Normal affect.   Discharge Instructions  Discharge Instructions      Call MD for:  difficulty breathing, headache or visual disturbances   Complete by: As directed    Call MD for:  extreme fatigue   Complete by: As directed    Call MD for:  persistant dizziness or light-headedness   Complete by: As directed    Call MD for:  persistant nausea and vomiting   Complete by: As directed    Call MD for:  temperature >100.4   Complete by: As directed    Diet - low sodium heart healthy   Complete by: As directed    Discharge instructions   Complete by: As directed    It has been a pleasure taking care of you!  You were hospitalized due to acute kidney failure likely from low blood pressure in the setting of diarrhea, and and confusion.  You also have urinary tract infection.  Your symptoms improved after antibiotics for urinary tract infection and adjustment of your other medications.  We are discharging you on more antibiotics to complete treatment course for urinary tract infection.  We also noted that you ordered multiple medications with potential to cause confusion, difficulty breathing and other serious side effects.  We recommend discussing your other medications with your psychiatrist or other prescribers.  Continue using your inhalers.   Please review your new medication list and the directions on your medications before you take them.  Follow-up with your primary care doctor in 1 to 2 weeks.  We also recommend follow-up with your psychiatrist as soon as possible.   Take care,   Discharge wound care:   Complete by: As directed    Silicone foam dressings to the sacral wounds change every 3 days. Assess under dressings each shift for any acute changes in the wounds.   Increase activity slowly   Complete by: As directed       Allergies as of 08/16/2020       Reactions   Hydrocodone Shortness Of Breath   Hydroxyzine Anaphylaxis, Shortness Of Breath   Latuda [lurasidone Hcl] Anaphylaxis   Magnesium-containing Compounds Anaphylaxis   Tolerated  Ensure   Prednisone Anaphylaxis, Swelling, Other (See Comments)   Tongue swelling, lip swelling, throat swelling, per pt    Tramadol Anaphylaxis, Swelling   Codeine Nausea And Vomiting   Sulfa Antibiotics Itching   Tape Rash        Medication List     STOP taking these medications    tiZANidine 4 MG tablet Commonly known as: Zanaflex       TAKE these medications    albuterol (2.5 MG/3ML) 0.083% nebulizer solution Commonly known as: PROVENTIL Take 3 mLs (2.5 mg total) by nebulization every 6 (six) hours as needed for wheezing or shortness of breath.   albuterol 108 (90 Base) MCG/ACT inhaler Commonly known as: VENTOLIN HFA Inhale 2 puffs into the lungs every 6 (six) hours as needed for wheezing or shortness of breath.   amLODipine 5 MG tablet Commonly known as: NORVASC Take 1 tablet (5 mg total) by mouth daily.   asenapine 5 MG Subl 24 hr tablet Commonly known as: Saphris Place 2 tablets (10 mg total) under the tongue 2 (two) times daily.   cephALEXin 500 MG capsule Commonly known as: KEFLEX Take 1 capsule (500 mg total) by mouth 3 (three) times daily for 4 days.   colchicine 0.6 MG tablet Take 1 tablet (0.6 mg total) by mouth daily. What changed: when to take this   diazepam 10 MG tablet Commonly known as: VALIUM Take 1 tablet (10 mg total) by mouth 3 (three) times daily as needed for anxiety.   diclofenac 75 MG EC tablet Commonly known as: VOLTAREN Take 75 mg by mouth 2 (two) times daily.   escitalopram 10 MG tablet Commonly known as: Lexapro Take 1 tablet (10 mg total) by mouth daily.   feeding supplement Liqd Take 237 mLs by mouth 2 (two) times daily between meals.   furosemide 80 MG tablet Commonly known as: LASIX Take 1 tablet (80 mg total) by mouth daily.   gabapentin 600 MG tablet Commonly known as: NEURONTIN Take 0.5 tablets (300 mg total) by mouth 2 (two) times daily.   lidocaine 5 % Commonly known as: Lidoderm Place 1 patch onto the  skin daily. Remove & Discard patch within 12 hours or as directed by MD   Misc. Devices Misc Power wheelchair.  Diagnosis-frequent falls, bilateral knee osteoarthritis   Misc. Devices Misc Bedside Commode DX: frequent fall   multivitamin with minerals Tabs tablet Take 1 tablet by mouth daily.   potassium chloride SA 20 MEQ tablet Commonly known as: KLOR-CON Take 2 tablets (40 mEq total) by mouth 2 (two) times daily.   prazosin 5 MG capsule Commonly known as: MINIPRESS Take 5 mg by mouth 2 (two) times daily.   prazosin 2 MG capsule Commonly known as: MINIPRESS Take 2 mg by mouth at bedtime.  topiramate 25 MG tablet Commonly known as: Topamax Take 1 tablet (25 mg total) by mouth 2 (two) times daily.   Trelegy Ellipta 100-62.5-25 MCG/INH Aepb Generic drug: Fluticasone-Umeclidin-Vilant Inhale 1 puff into the lungs daily.   Vyvanse 20 MG capsule Generic drug: lisdexamfetamine Take 20 mg by mouth daily as needed (anxiety/ focusing).       ASK your doctor about these medications    diclofenac Sodium 1 % Gel Commonly known as: Voltaren Apply 4 g topically 4 (four) times daily.   montelukast 10 MG tablet Commonly known as: SINGULAIR TAKE 1 TABLET(10 MG) BY MOUTH AT BEDTIME   omeprazole 40 MG capsule Commonly known as: PRILOSEC TAKE 1 CAPSULE(40 MG) BY MOUTH DAILY   oxybutynin 5 MG tablet Commonly known as: DITROPAN TAKE 1 TABLET(5 MG) BY MOUTH TWICE DAILY   thiamine 100 MG tablet Take 1 tablet (100 mg total) by mouth daily.               Discharge Care Instructions  (From admission, onward)           Start     Ordered   08/15/20 0000  Discharge wound care:       Comments: Silicone foam dressings to the sacral wounds change every 3 days. Assess under dressings each shift for any acute changes in the wounds.   08/15/20 1051            Consultations: None  Procedures/Studies   CT ABDOMEN PELVIS WO CONTRAST  Result Date:  08/08/2020 CLINICAL DATA:  Flank pain, kidney stone suspected new AKI, AMS EXAM: CT ABDOMEN AND PELVIS WITHOUT CONTRAST TECHNIQUE: Multidetector CT imaging of the abdomen and pelvis was performed following the standard protocol without IV contrast. COMPARISON:  July 23, 2020. FINDINGS: Evaluation is limited by lack of IV contrast. Lower chest: No acute abnormality. Hepatobiliary: Cholelithiasis within the mildly distended gallbladder, similar in comparison to prior. No pericholecystic fat stranding. Unremarkable noncontrast appearance of the liver. Pancreas: No peripancreatic fat stranding. Spleen: Unremarkable. Adrenals/Urinary Tract: Unchanged low-density LEFT adrenal nodule, likely a adrenal adenoma. RIGHT adrenal gland is unremarkable. No hydronephrosis. No nephrolithiasis. Bladder is decompressed, limiting evaluation. Stomach/Bowel: Colon is distended with fluid. Appendiceal candidate is normal; there is no ancillary evidence of appendicitis. No evidence of bowel obstruction. No pneumatosis or portal venous gas. Vascular/Lymphatic: Minimal atherosclerotic calcifications of the aorta. No suspicious lymphadenopathy visualized. Reproductive: Unchanged 8.4 cm LEFT adnexal cyst. Status post hysterectomy. Other: No free air.  Status post periumbilical hernia repair. Musculoskeletal: Remote LEFT-sided rib fractures. No acute osseous abnormality. IMPRESSION: 1. The colon is nondilated but is distended with fluid. This could reflect a nonspecific colitis. No definitive noncontrast evidence of bowel ischemia. 2. Stable appearance of a LEFT adnexal cyst for which a follow-up ultrasound in 6-12 months is recommended. 3. Similar appearance of cholelithiasis without evidence of cholecystitis. Electronically Signed   By: Valentino Saxon MD   On: 08/08/2020 18:51   CT ABDOMEN PELVIS WO CONTRAST  Result Date: 07/23/2020 CLINICAL DATA:  Left lower quadrant abdominal pain for 4 days. EXAM: CT ABDOMEN AND PELVIS WITHOUT  CONTRAST TECHNIQUE: Multidetector CT imaging of the abdomen and pelvis was performed following the standard protocol without IV contrast. COMPARISON:  05/11/2020 FINDINGS: Lower chest: Lung bases are clear. Hepatobiliary: Cholelithiasis with multiple small stones layering in the gallbladder. No inflammatory changes. No focal liver lesions. Bile ducts are not dilated. Pancreas: Unremarkable. No pancreatic ductal dilatation or surrounding inflammatory changes. Spleen: Normal in size without focal  abnormality. Adrenals/Urinary Tract: Adrenal glands are unremarkable. Kidneys are normal, without renal calculi, focal lesion, or hydronephrosis. Bladder is unremarkable. Stomach/Bowel: Stomach, small bowel, and colon are mostly decompressed. No inflammatory changes are appreciated. Appendix is not identified. Vascular/Lymphatic: No aortic aneurysm. No significant lymphadenopathy. Reproductive: Uterus appears surgically absent. Cystic lesion likely arising from the left adnexum, measuring 7.9 cm, similar to previous study. Other: No free air or free fluid in the abdomen. Abdominal wall musculature appears intact. Musculoskeletal: Degenerative changes in the spine. No destructive bone lesions. IMPRESSION: 1. No acute process demonstrated in the abdomen or pelvis. No evidence of bowel obstruction or inflammation. No renal or ureteral stone or obstruction. 2. Cholelithiasis without evidence of cholecystitis. 3. 7.9 cm diameter cystic lesion in the left adnexum as seen previously. Because this lesion is not adequately characterized, prompt [name] is recommended for further evaluation. Note: This recommendation does not apply to premenarchal patients and to those with increased risk (genetic, family history, elevated tumor markers or other high-risk factors) of ovarian cancer. Reference: JACR 2020 Feb; 17(2):248-254 Electronically Signed   By: Lucienne Capers M.D.   On: 07/23/2020 01:55   CT Head Wo Contrast  Result Date:  08/08/2020 CLINICAL DATA:  BIB EMS from home, family reports that patient has been weak and lethargic since hospital discharge, over 5 days they have noticed bright red blood in stool. W/ EMS pt had pressure of 100/50 and then 90/40, then 80/40, pt refused EMS staring an IV. When EMS arrived pt had her BP pills in her hand, told EMS she would eat them, EMS removed her meds. Hx of psych issues. EXAM: CT HEAD WITHOUT CONTRAST TECHNIQUE: Contiguous axial images were obtained from the base of the skull through the vertex without intravenous contrast. COMPARISON:  07/22/2020 FINDINGS: Brain: No evidence of acute infarction, hemorrhage, hydrocephalus, extra-axial collection or mass lesion/mass effect. Patchy periventricular white matter hypoattenuation is noted consistent with mild chronic microvascular ischemic change, stable. Vascular: No hyperdense vessel or unexpected calcification. Skull: Normal. Negative for fracture or focal lesion. Sinuses/Orbits: Globes and orbits are unremarkable. Visualized sinuses are clear. Other: None. IMPRESSION: 1. No acute intracranial abnormalities. No change from the prior study. Electronically Signed   By: Lajean Manes M.D.   On: 08/08/2020 15:43   CT Head Wo Contrast  Result Date: 07/22/2020 CLINICAL DATA:  Lower abdominal pain, nausea, vomiting, diarrhea. Head trauma. EXAM: CT HEAD WITHOUT CONTRAST CT CERVICAL SPINE WITHOUT CONTRAST TECHNIQUE: Multidetector CT imaging of the head and cervical spine was performed following the standard protocol without intravenous contrast. Multiplanar CT image reconstructions of the cervical spine were also generated. COMPARISON:  CT head 05/14/2020. CT cervical spine 10/19/2018 FINDINGS: CT HEAD FINDINGS Brain: Patchy and confluent areas of decreased attenuation are noted throughout the deep and periventricular white matter of the cerebral hemispheres bilaterally, compatible with chronic microvascular ischemic disease. No evidence of  large-territorial acute infarction. No parenchymal hemorrhage. No mass lesion. No extra-axial collection. No mass effect or midline shift. No hydrocephalus. Basilar cisterns are patent. Empty sella. Vascular: No hyperdense vessel. Skull: No acute fracture or focal lesion. Sinuses/Orbits: Paranasal sinuses and mastoid air cells are clear. The orbits are unremarkable. Other: None. CT CERVICAL SPINE FINDINGS Alignment: Slight reversal of the normal cervical lordosis centered at the C3 level likely due to degenerative changes and positioning. Skull base and vertebrae: Multilevel moderate degenerative changes of the spine. No acute fracture. No aggressive appearing focal osseous lesion or focal pathologic process. Soft tissues and spinal canal: No  prevertebral fluid or swelling. No visible canal hematoma. Upper chest: Unremarkable. Other: None. IMPRESSION: 1. No acute intracranial abnormality. 2. No acute displaced fracture or traumatic listhesis of the cervical spine. 3. Empty sella. Findings is often a normal anatomic variant but can be associated with idiopathic intracranial hypertension (pseudotumor cerebri). Electronically Signed   By: Iven Finn M.D.   On: 07/22/2020 23:36   CT Cervical Spine Wo Contrast  Result Date: 07/22/2020 CLINICAL DATA:  Lower abdominal pain, nausea, vomiting, diarrhea. Head trauma. EXAM: CT HEAD WITHOUT CONTRAST CT CERVICAL SPINE WITHOUT CONTRAST TECHNIQUE: Multidetector CT imaging of the head and cervical spine was performed following the standard protocol without intravenous contrast. Multiplanar CT image reconstructions of the cervical spine were also generated. COMPARISON:  CT head 05/14/2020. CT cervical spine 10/19/2018 FINDINGS: CT HEAD FINDINGS Brain: Patchy and confluent areas of decreased attenuation are noted throughout the deep and periventricular white matter of the cerebral hemispheres bilaterally, compatible with chronic microvascular ischemic disease. No evidence of  large-territorial acute infarction. No parenchymal hemorrhage. No mass lesion. No extra-axial collection. No mass effect or midline shift. No hydrocephalus. Basilar cisterns are patent. Empty sella. Vascular: No hyperdense vessel. Skull: No acute fracture or focal lesion. Sinuses/Orbits: Paranasal sinuses and mastoid air cells are clear. The orbits are unremarkable. Other: None. CT CERVICAL SPINE FINDINGS Alignment: Slight reversal of the normal cervical lordosis centered at the C3 level likely due to degenerative changes and positioning. Skull base and vertebrae: Multilevel moderate degenerative changes of the spine. No acute fracture. No aggressive appearing focal osseous lesion or focal pathologic process. Soft tissues and spinal canal: No prevertebral fluid or swelling. No visible canal hematoma. Upper chest: Unremarkable. Other: None. IMPRESSION: 1. No acute intracranial abnormality. 2. No acute displaced fracture or traumatic listhesis of the cervical spine. 3. Empty sella. Findings is often a normal anatomic variant but can be associated with idiopathic intracranial hypertension (pseudotumor cerebri). Electronically Signed   By: Iven Finn M.D.   On: 07/22/2020 23:36   DG Chest Port 1 View  Result Date: 08/08/2020 CLINICAL DATA:  Sepsis. EXAM: PORTABLE CHEST 1 VIEW COMPARISON:  February 18, 2020. FINDINGS: Stable cardiomediastinal silhouette. Both lungs are clear. The visualized skeletal structures are unremarkable. IMPRESSION: No active disease. Electronically Signed   By: Marijo Conception M.D.   On: 08/08/2020 15:49   US PELVIC COMPLETE WITH TRANSVAGINAL  Result Date: 07/23/2020 CLINICAL DATA:  Evaluate left adnexal cyst. History of hysterectomy. EXAM: TRANSABDOMINAL AND TRANSVAGINAL ULTRASOUND OF PELVIS TECHNIQUE: Both transabdominal and transvaginal ultrasound examinations of the pelvis were performed. Transabdominal technique was performed for global imaging of the pelvis including uterus,  ovaries, adnexal regions, and pelvic cul-de-sac. It was necessary to proceed with endovaginal exam following the transabdominal exam to visualize the adnexa. COMPARISON:  CT 07/23/2020, 10/30/2019 FINDINGS: Uterus Surgically absent. Right ovary Not visualized. Left ovary Circumscribed rounded cystic structure in the left adnexal region measures 7.9 x 6.7 x 8.7 cm. No mural nodularity, internal septations, or solid components are identified. The left ovary was not well seen. Other findings No abnormal free fluid. IMPRESSION: 1. Simple appearing cystic lesion within the left adnexa measuring up to 8.7 cm. A follow-up ultrasound in 6-12 months is recommended to assess for growth. This recommendation follows the consensus statement: Management of Asymptomatic Ovarian and Other Adnexal Cysts Imaged at [name]: Society of Radiologists in [place]. Radiology 2010; [phone]. 2. Nonvisualization of the right ovary. 3. Surgically absent uterus. Electronically Signed   By:  Nicholas  Plundo D.O.   On: 07/23/2020 11:46       The results of significant diagnostics from this hospitalization (including imaging, microbiology, ancillary and laboratory) are listed below for reference.     Microbiology: Recent Results (from the past 240 hour(s))  Blood Culture (routine x 2)     Status: None   Collection Time: 08/08/20  4:06 PM   Specimen: BLOOD  Result Value Ref Range Status   Specimen Description   Final    BLOOD LEFT ANTECUBITAL Performed at [facility] [address], [place], [place] [address]    Special Requests   Final    BOTTLES DRAWN AEROBIC AND ANAEROBIC Blood Culture results may not be optimal due to an inadequate volume of blood received in culture bottles Performed at [facility] [address], [place], [place] [address]    Culture   Final    NO GROWTH 6 DAYS Performed at [facility], [place] [address] [place], [place]  [address]    Report Status 08/14/2020 FINAL  Final  Resp Panel by RT-PCR (Flu A&B, Covid) Nasopharyngeal Swab     Status: None   Collection Time: 08/08/20  6:04 PM   Specimen: Nasopharyngeal Swab; Nasopharyngeal(NP) swabs in vial transport medium  Result Value Ref Range Status   SARS Coronavirus 2 by RT PCR NEGATIVE NEGATIVE Final    Comment: (NOTE) SARS-CoV-2 target nucleic acids are NOT DETECTED.  The SARS-CoV-2 RNA is generally detectable in upper respiratory specimens during the acute phase of infection. The lowest concentration of SARS-CoV-2 viral copies this assay can detect is 138 copies/mL. A negative result does not preclude SARS-Cov-2 infection and should not be used as the sole basis for treatment or other patient management decisions. A negative result may occur with  improper specimen collection/handling, submission of specimen other than nasopharyngeal swab, presence of viral mutation(s) within the areas targeted by this assay, and inadequate number of viral copies(<138 copies/mL). A negative result must be combined with clinical observations, patient history, and epidemiological information. The expected result is Negative.  Fact Sheet for Patients:  EntrepreneurPulse.com.au  Fact Sheet for Healthcare Providers:  IncredibleEmployment.be  This test is no t yet approved or cleared by the [place] FDA and  has been authorized for detection and/or diagnosis of SARS-CoV-2 by FDA under an Emergency Use Authorization (EUA). This EUA will remain  in effect (meaning this test can be used) for the duration of the COVID-19 declaration under Section 564(b)(1) of the Act, 21 U.S.C.section 360bbb-3(b)(1), unless the authorization is terminated  or revoked sooner.       Influenza A by PCR NEGATIVE NEGATIVE Final   Influenza B by PCR NEGATIVE NEGATIVE Final    Comment: (NOTE) The Xpert Xpress SARS-CoV-2/FLU/RSV plus assay is intended as an  aid in the diagnosis of influenza from Nasopharyngeal swab specimens and should not be used as a sole basis for treatment. Nasal washings and aspirates are unacceptable for Xpert Xpress SARS-CoV-2/FLU/RSV testing.  Fact Sheet for Patients: EntrepreneurPulse.com.au  Fact Sheet for Healthcare Providers: IncredibleEmployment.be  This test is not yet approved or cleared by the [place] FDA and has been authorized for detection and/or diagnosis of SARS-CoV-2 by FDA under an Emergency Use Authorization (EUA). This EUA will remain in effect (meaning this test can be used) for the duration of the COVID-19 declaration under Section 564(b)(1) of the Act, 21 U.S.C. section 360bbb-3(b)(1), unless the authorization is terminated or revoked.  Performed at  [facility], [place] [address] [place], [place] [address]   MRSA Next Gen by PCR, Nasal     Status: None   Collection Time: 08/09/20  1:14 AM   Specimen: Nasal Mucosa; Nasal Swab  Result Value [place] Status   MRSA by PCR Next Gen NOT DETECTED NOT DETECTED Final    Comment: (NOTE) The GeneXpert MRSA Assay (FDA approved for NASAL specimens only), is one component of a comprehensive MRSA colonization surveillance program. It is not intended to diagnose MRSA infection nor to guide or monitor treatment for MRSA infections. Test performance is not FDA approved in patients less than 24 years old. Performed at [facility], [place] [address] [place], [place] [address]   Blood Culture (routine x 2)     Status: None   Collection Time: 08/09/20  2:21 AM   Specimen: BLOOD  Result Value Ref [place] Status   Specimen Description   Final    BLOOD BLOOD RIGHT HAND Performed at [place] [address], [place], [place] [address]    Special Requests   Final    BOTTLES DRAWN AEROBIC AND ANAEROBIC Blood Culture adequate volume Performed at [place] [address] [place], [place] [address]    Culture   Final    NO GROWTH 5 DAYS Performed at [facility], [place] [address], [place], [place] [address]    Report Status 08/14/2020 FINAL  Final  C Difficile Quick Screen w PCR reflex     Status: Abnormal   Collection Time: 08/09/20  2:30 AM   Specimen: STOOL  Result Value [place] Status   C Diff antigen NON REACTIVE (A) NEGATIVE Final   C Diff toxin NON REACTIVE (A) NEGATIVE Final   C Diff interpretation NEGATIVE  Final    Comment: Performed at [facility], [place] [address], [place], [place] [address]  Gastrointestinal Panel by PCR , Stool     Status: None   Collection Time: 08/09/20 11:08 AM   Specimen: Stool  Result Value [place] Status   Campylobacter species NOT DETECTED NOT DETECTED Final   Plesimonas shigelloides NOT DETECTED NOT DETECTED Final   Salmonella species NOT DETECTED NOT DETECTED Final   Yersinia enterocolitica NOT DETECTED NOT DETECTED Final   Vibrio species NOT DETECTED NOT DETECTED Final   Vibrio cholerae NOT DETECTED NOT DETECTED Final   Enteroaggregative E coli (EAEC) NOT DETECTED NOT DETECTED Final   Enteropathogenic E coli (EPEC) NOT DETECTED NOT DETECTED Final   Enterotoxigenic E coli (ETEC) NOT DETECTED NOT DETECTED Final   Shiga like toxin producing E coli (STEC) NOT DETECTED NOT DETECTED Final   Shigella/Enteroinvasive E coli (EIEC) NOT DETECTED NOT DETECTED Final   Cryptosporidium NOT DETECTED NOT DETECTED Final   Cyclospora cayetanensis NOT DETECTED NOT DETECTED Final   Entamoeba histolytica NOT DETECTED NOT DETECTED Final   Giardia lamblia NOT DETECTED NOT DETECTED Final   Adenovirus F40/41 NOT DETECTED NOT DETECTED Final   Astrovirus NOT DETECTED NOT DETECTED Final   Norovirus GI/GII NOT DETECTED NOT DETECTED Final   Rotavirus A NOT DETECTED NOT DETECTED Final   Sapovirus (I, II, IV, and V) NOT DETECTED NOT DETECTED Final    Comment: Performed at  [facility], [address] [place], [place] [address]  Calprotectin, Fecal     Status: Abnormal   Collection Time: 08/10/20  1:16 PM   Specimen: Stool  Result Value [place] Status  Calprotectin, Fecal 277 (H) 0 - 120 ug/g Final    Comment: (NOTE) Concentration     Interpretation   Follow-Up <16 - 50 ug/g     Normal           None >50 -120 ug/g     Borderline       Re-evaluate in 4-6 weeks    >120 ug/g     Abnormal         Repeat as clinically                                   indicated Performed At: [facility] [place], [place] [id number] Rush Farmer MD SL:[id number]   Urine Culture     Status: Abnormal   Collection Time: 08/13/20 10:11 AM   Specimen: Urine, Clean Catch  Result Value Ref [place] Status   Specimen Description   Final    URINE, CLEAN CATCH Performed at [facility], [place] [address] [place], [place] [address]    Special Requests   Final    NONE Performed at [facility], [place] [address] [place], [place] [address]    Culture (A)  Final    90,000 COLONIES/mL KLEBSIELLA PNEUMONIAE 50,000 COLONIES/mL ESCHERICHIA COLI    Report Status 08/15/2020 FINAL  Final   Organism ID, Bacteria KLEBSIELLA PNEUMONIAE (A)  Final   Organism ID, Bacteria ESCHERICHIA COLI (A)  Final      Susceptibility   Escherichia coli - MIC*    AMPICILLIN >=32 RESISTANT Resistant     CEFAZOLIN <=4 SENSITIVE Sensitive     CEFEPIME <=0.12 SENSITIVE Sensitive     CEFTRIAXONE <=0.25 SENSITIVE Sensitive     CIPROFLOXACIN 1 SENSITIVE Sensitive     GENTAMICIN <=1 SENSITIVE Sensitive     IMIPENEM <=0.25 SENSITIVE Sensitive     NITROFURANTOIN <=16 SENSITIVE Sensitive     TRIMETH/SULFA >=320 RESISTANT Resistant     AMPICILLIN/SULBACTAM 4 SENSITIVE Sensitive     PIP/TAZO <=4 SENSITIVE Sensitive     * 50,000 COLONIES/mL ESCHERICHIA COLI   Klebsiella pneumoniae - MIC*    AMPICILLIN RESISTANT Resistant     CEFAZOLIN <=4  SENSITIVE Sensitive     CEFEPIME <=0.12 SENSITIVE Sensitive     CEFTRIAXONE <=0.25 SENSITIVE Sensitive     CIPROFLOXACIN <=0.25 SENSITIVE Sensitive     GENTAMICIN <=1 SENSITIVE Sensitive     IMIPENEM <=0.25 SENSITIVE Sensitive     NITROFURANTOIN 128 RESISTANT Resistant     TRIMETH/SULFA <=20 SENSITIVE Sensitive     AMPICILLIN/SULBACTAM 4 SENSITIVE Sensitive     PIP/TAZO <=4 SENSITIVE Sensitive     * 90,000 COLONIES/mL KLEBSIELLA PNEUMONIAE     Labs:  CBC: Recent Labs  Lab 08/09/20 1849 08/14/20 0746 08/15/20 0804 08/16/20 1255  WBC 3.4* 4.0 4.7 3.8*  NEUTROABS  --   --   --  2.3  HGB 8.7* 8.3* 8.7* 8.7*  HCT 27.3* 26.2* 27.8* 28.5*  MCV 90.7 91.0 92.7 92.8  PLT 357 340 336 375   BMP &GFR Recent Labs  Lab 08/12/20 0503 08/13/20 0727 08/14/20 0746 08/15/20 0804 08/16/20 0431  NA 138 136 130* 132* 134*  K 3.8 4.1 3.9 3.9 4.0  CL 97* 100 99 103 107  CO2 34* _0 21*  GLUCOSE 81 82 83 81 77  BUN _1 CREATININE 1.33* 1.20* 1.23* 1.28* 1.21*  CALCIUM 8.0*  8.6* 8.4* 8.1* 8.1*  MG 1.5* 2.0 1.7 1.7 1.9  PHOS  --   --   --  4.6 4.4   Estimated Creatinine Clearance: 42.5 mL/min (A) (by C-G formula based on SCr of 1.21 mg/dL (H)). Liver & Pancreas: Recent Labs  Lab 08/09/20 1849 08/11/20 0247 08/15/20 0804 08/16/20 0431  AST 16 16  --   --   ALT 9 9  --   --   ALKPHOS 119 102  --   --   BILITOT 0.5 0.3  --   --   PROT 5.3* 4.2*  --   --   ALBUMIN 2.7* 2.4* 2.5* 2.5*   No results for input(s): LIPASE, AMYLASE in the last 168 hours. No results for input(s): AMMONIA in the last 168 hours. Diabetic: No results for input(s): HGBA1C in the last 72 hours. Recent Labs  Lab 08/10/20 0416 08/10/20 0755 08/10/20 1124 08/10/20 1531 08/10/20 2021  GLUCAP 75 70 79 85 96   Cardiac Enzymes: No results for input(s): CKTOTAL, CKMB, CKMBINDEX, TROPONINI in the last 168 hours. Recent Labs    06/04/20 1140 06/15/20 1636 07/13/20 1310  PROBNP 11,232*  2,313* 201   Coagulation Profile: No results for input(s): INR, PROTIME in the last 168 hours. Thyroid Function Tests: No results for input(s): TSH, T4TOTAL, FREET4, T3FREE, THYROIDAB in the last 72 hours. Lipid Profile: No results for input(s): CHOL, HDL, LDLCALC, TRIG, CHOLHDL, LDLDIRECT in the last 72 hours. Anemia Panel: No results for input(s): VITAMINB12, FOLATE, FERRITIN, TIBC, IRON, RETICCTPCT in the last 72 hours. Urine analysis:    Component Value Date/Time   COLORURINE YELLOW 08/13/2020 1011   APPEARANCEUR HAZY (A) 08/13/2020 1011   LABSPEC 1.006 08/13/2020 1011   PHURINE 9.0 (H) 08/13/2020 1011   GLUCOSEU NEGATIVE 08/13/2020 1011   HGBUR NEGATIVE 08/13/2020 [place] 08/13/2020 1011   BILIRUBINUR NEG 05/21/2014 1155   KETONESUR NEGATIVE 08/13/2020 1011   PROTEINUR NEGATIVE 08/13/2020 1011   UROBILINOGEN 0.2 09/30/2019 1557   NITRITE NEGATIVE 08/13/2020 1011   LEUKOCYTESUR LARGE (A) 08/13/2020 1011   Sepsis Labs: Invalid input(s): PROCALCITONIN, LACTICIDVEN   Time coordinating discharge: 45 minutes  SIGNED:  Mercy Riding, MD  [facility] 08/16/2020, 5:05 PM  If 7PM-7AM, please contact night-coverage [email or web address]

## 2020-08-16 NOTE — Care Management Important Message (Signed)
Important Message  Patient Details IM Letter given to the Patient. Name: Tiffany [name] MRN: [id number] Date of Birth: 1960/06/11   Medicare Important Message Given:  Yes     Kerin Salen 08/16/2020, 11:49 AM

## 2020-08-17 ENCOUNTER — Telehealth: Payer: Self-pay

## 2020-08-17 NOTE — Telephone Encounter (Signed)
Pt calling in again. She states that she forgot to ask for her gout medication to be refilled. She states that it starts with Febux, but cant see the rest of the medication name. She states that she is not sure how to pronounce it. [name] requesting to have this refilled as well. Please advise.
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
## 2020-08-17 NOTE — Telephone Encounter (Signed)
From the discharge call:  She said she has been nervous since returning home. She stated that she has an appointment with psychiatry in September but she could not remember the exact date while on the phone.    She said she has all medications except potassium and gabapentin . She also said that she should be on metolazone and she does not have any and it is not listed on her AVS.   She has been approved for [facility] but stated that the agency that she has been using does not have a caregiver available for the hours in the afternoon that she wants so she is waiting to see if they are able to find another aide  The hospital was not able to find a home health agency to accept a referral for services.   Scheduled to see Dr Margarita Rana  on 09/07/2020

## 2020-08-18 ENCOUNTER — Encounter (HOSPITAL_COMMUNITY): Payer: Self-pay | Admitting: Psychiatry

## 2020-08-18 ENCOUNTER — Encounter (HOSPITAL_COMMUNITY): Payer: Self-pay

## 2020-08-18 ENCOUNTER — Other Ambulatory Visit: Payer: Self-pay

## 2020-08-18 ENCOUNTER — Ambulatory Visit (HOSPITAL_COMMUNITY): Payer: 59 | Admitting: Psychiatry

## 2020-08-18 ENCOUNTER — Telehealth: Payer: Self-pay

## 2020-08-18 DIAGNOSIS — F250 Schizoaffective disorder, bipolar type: Secondary | ICD-10-CM

## 2020-08-18 MED ORDER — FEBUXOSTAT 40 MG PO TABS: 40.0000 mg | ORAL_TABLET | Freq: Every day | ORAL | 3 refills | Status: DC

## 2020-08-18 MED ORDER — GABAPENTIN 600 MG PO TABS: 300.0000 mg | ORAL_TABLET | Freq: Two times a day (BID) | ORAL | 2 refills | Status: DC

## 2020-08-18 NOTE — Telephone Encounter (Signed)
Done

## 2020-08-18 NOTE — Telephone Encounter (Signed)
I have sent prescriptions for gabapentin and Uloric to her pharmacy.  She needs to discuss metolazone at her cardiology visit next week as it is not on her med list.

## 2020-08-18 NOTE — Progress Notes (Signed)
Counselor called and left vm with Client, as she did not answer the phone at the time of appointment. Client was recently hospitalized and treated for kidney failure and low blood pressure, discharged 08/16/20.   Lise Auer, LCSW

## 2020-08-18 NOTE — Telephone Encounter (Signed)
Call placed to patient to inform her Dr Margarita Rana  sent prescriptions for gabapentin and Uloric to her pharmacy.  She needs to discuss metolazone at her cardiology visit next week as it is not on her med list. Message left with call back requested to this CM

## 2020-08-18 NOTE — Telephone Encounter (Signed)
LVM for pt to call back as soon as possible.   RE: scheduling for mobile mammo event in August or September.

## 2020-08-19 ENCOUNTER — Telehealth: Payer: Self-pay | Admitting: Family Medicine
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
## 2020-08-19 NOTE — Telephone Encounter (Signed)
Pt needs rails to go up and down steps to get in and out of her home/ Pt has fallen from steps twice and hit her head/ pts landlord advised she needs a letter from the Doctor asap to get rails / please advise and call when letter is ready

## 2020-08-19 NOTE — Telephone Encounter (Signed)
Attempted x 3 to call patient to inquire about handrails for stairs. The recording stated that the call could not be completed at this time

## 2020-08-23 ENCOUNTER — Other Ambulatory Visit: Payer: Self-pay | Admitting: Family Medicine

## 2020-08-23 NOTE — Telephone Encounter (Signed)
Unable to leave message again today regarding request for handrails.
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
## 2020-08-23 NOTE — Telephone Encounter (Signed)
Call placed to patient to inform her Dr Margarita Rana  sent prescriptions for gabapentin and Uloric to her pharmacy.  She needs to discuss metolazone at her cardiology visit next week as it is not on her med list. Also need to obtain more information about her request for handrails into her home.   Unable to leave a message, the phone rings multiple times and then goes to fast busy.

## 2020-08-24 ENCOUNTER — Other Ambulatory Visit: Payer: Self-pay | Admitting: Family Medicine

## 2020-08-24 ENCOUNTER — Other Ambulatory Visit (HOSPITAL_COMMUNITY): Payer: Self-pay | Admitting: Psychiatry

## 2020-08-24 DIAGNOSIS — K219 Gastro-esophageal reflux disease without esophagitis: Secondary | ICD-10-CM

## 2020-08-24 NOTE — Progress Notes (Deleted)
Cardiology Office Note:    Date:  08/24/2020   ID:  Tiffany [name], DOB 06/27/1960, MRN [id number]  PCP:  [name], MD   [facility] Providers Cardiologist:  Fransico Him, MD { Click to update primary MD,subspecialty MD or APP then REFRESH:1}  ***  Referring MD: [name], MD   Chief Complaint:  No chief complaint on file.    Patient Profile:    Tiffany [name] is a 60 y.o. female with:  (HFpEF) heart failure with preserved ejection fraction  Hypertension   Hyperlipidemia  Diabetes mellitus Hx of CVA  Asthma, COPD - On O2 Hx of cocain use Chronic kidney disease Anemia  Carotid stenosis  GERD  Gout  OSA Schizoaffective Disorder Hx of cardiac arrest in 2018  Prior CV studies: Echocardiogram 05/13/20  1. Left ventricular ejection fraction, by estimation, is 55 to 60%. The  left ventricle has normal function. The left ventricle has no regional  wall motion abnormalities. Left ventricular diastolic parameters are  consistent with Grade I diastolic  dysfunction (impaired relaxation).   2. Right ventricular systolic function is normal. The right ventricular  size is normal. There is normal pulmonary artery systolic pressure. The  estimated right ventricular systolic pressure is 20.2 mmHg.   3. Left atrial size was mildly dilated.   4. The mitral valve is grossly normal. Trivial mitral valve  regurgitation. No evidence of mitral stenosis.   5. The aortic valve is tricuspid. Aortic valve regurgitation is not  visualized. No aortic stenosis is present.   6. The inferior vena cava is normal in size with greater than 50%  respiratory variability, suggesting right atrial pressure of 3 mmHg.   RIGHT/LEFT HEART CATH AND CORONARY ANGIOGRAPHY 06/19/2017 Ao = 117/72 (92) LV = 111/18 RA = 10 RV = 40/12 PA = 48/18 (29) PCW = 19 Fick cardiac output/index = 6.9/3.0 PVR = 1,5 WU Ao sat = 98% PA sat = 66%, 67%  Assessment: 1. Normal coronary arteries 2.  Normal LV function EF 60-65% 3. Mild PAH  VAS US CAROTID DUPLEX BILATERAL 11/16/2016 Bilat ICA  1-39% stenosis.    History of Present Illness: Tiffany [name] established with Dr. Harrington Challenger in 5/22.  She had previously been followed by [name] and Dr. Phillip Heal.  She has not been seen since 2019 piror to her visit with Dr. Harrington Challenger.   Her BNP was elevated and she was placed on Metolazone 2 x a week.   A f/u K+ in 6/22 was 2.3.  Her K+ was replaced.  She was then admitted 3/3-4/35 with metabolic derangement in the setting of AKI (Creatinine 2.75, K+ 2.2, Na 113, Mg2+ 0.8).  records indicate she had diarrhea at home and was likely dehydrated.  She was given IVFs and her e-lytes were repleted.  PT recommended DC to SNF but the pt declined.  She was readmitted 7/24-8/1 with AKI, hypotension and metabolic encephalopathy.  She was tx for a UTI.  She was seen by GI for chronic diarrhea with plans for OP f/u.  She again had low Na, K, Mg which was corrected.  It was again recommended that she go to SNF at DC but she declined.  She returns for Cardiology f/u.  ***    Past Medical History:  Diagnosis Date   Agitation 11/22/2017   Anoxic brain injury ([place]) 09/08/2016   C. Arrest due to respiratory failure and COPD exacerbation   Anxiety    Arthritis    "all over" (04/10/2016)  Asthma 10/18/2010   Binge eating disorder    Cardiac arrest ([place]) 09/08/2016   PEA   Carotid artery stenosis    1-39% bilateral by dopplers 11/2016   Chronic diastolic (congestive) heart failure ([place])    Chronic kidney disease    "I see a kidney dr." (04/10/2016)   Chronic pain syndrome 06/18/2012   Chronic post-traumatic stress disorder (PTSD) 05/27/2018   Chronic respiratory failure with hypoxia and hypercapnia ([place]) 06/22/2015   TRILOGY Vent >AVAPA-ES., Vt target 200-400, Max P 30 , PS max 20 , PS min 6-10 , E Max 6, E Min 4, Rate Auto AVAPS Rate 2 (titrate for pt comfort) , bleed O2 at 5l/m continuous flow .    CKD (chronic kidney  disease) stage 3, GFR 30-59 ml/min (HCC) 12/15/2016   Closed displaced fracture of fifth metacarpal bone 03/21/2018   Cocaine use disorder, severe, in sustained remission ([place]) 63/08/4663   Complication of anesthesia    decreased bp, decreased heart rate   COPD (chronic obstructive pulmonary disease) ([place]) 07/08/2014   Depression    Diabetic neuropathy ([place]) 04/24/2011   Difficulty with speech 01/24/2018   Disorder of nervous system    Drug abuse ([place]) 11/21/2017   Dyslipidemia 04/24/2011   Elevated troponin 04/28/2012   Emphysema    Encephalopathy 11/21/2017   Essential hypertension 03/22/2016   Fibula fracture 07/10/2016   Frequent falls 10/11/2017   GERD (gastroesophageal reflux disease)    Gout 04/11/2017   Heart attack ([place]) 1980s   History of blood transfusion 1994   "couldn't stop bleeding from my period"   History of drug abuse in remission ([place]) 11/28/2015   Quit in 2017   Hyperlipidemia LDL goal <70    Incontinence    Manic depression ([place])    Morbid obesity ([place]) 10/18/2010   Obstructive sleep apnea 10/18/2010   On home oxygen therapy    "6L; 24/7" (04/10/2016)   OSA on CPAP    "wear mask sometimes" (04/10/2016)   Paranoid ([place])    "sometimes; I'm on RX for it" (04/10/2016)   Prolonged Q-T interval on ECG    Rectal bleeding 12/31/2015   Right carotid bruit 11/09/2016   Schizoaffective disorder, bipolar type ([place]) 04/05/2018   Seasonal allergies    Seborrheic keratoses 12/31/2013   Seizures ([place])    "don't know what kind; last one was ~ 1 yr ago" (04/10/2016)   Stroke [facility]) 1980s   denies residual on 04/10/2016   Thrush 09/19/2013   Type 2 diabetes mellitus ([place]) 10/18/2010    Current Medications: No outpatient medications have been marked as taking for the 08/25/20 encounter (Appointment) with Richardson Dopp T, PA-C.   Current Facility-Administered Medications for the 08/25/20 encounter (Appointment) with Richardson Dopp T, PA-C  Medication   betamethasone acetate-betamethasone sodium  phosphate (CELESTONE) injection 6 mg     Allergies:   Hydrocodone, Hydroxyzine, Latuda [lurasidone hcl], Magnesium-containing compounds, Prednisone, Tramadol, Codeine, Sulfa antibiotics, and Tape   Social History   Tobacco Use   Smoking status: Former    Packs/day: 1.50    Years: 38.00    Pack years: 57.00    Types: Cigarettes    Start date: 03/13/1977    Quit date: 04/10/2016    Years since quitting: 4.3   Smokeless tobacco: Never  Vaping Use   Vaping Use: Never used  Substance Use Topics   Alcohol use: No    Alcohol/week: 0.0 standard drinks   Drug use: No    Types: Cocaine  Comment: 04/10/2016 "last used cocaine back in November 2017"     Family Hx: The patient's family history includes Anxiety disorder in her sister; Bipolar disorder in her sister; Cancer in her father and mother; Depression in her brother, mother, sister, and sister; Heart failure in an other family member; Schizophrenia in her sister.  ROS   EKGs/Labs/Other Test Reviewed:    EKG:  EKG is *** ordered today.  The ekg ordered today demonstrates ***  Recent Labs: 02/18/2020: B Natriuretic Peptide 584.2 07/13/2020: NT-Pro BNP 201 08/08/2020: TSH 0.765 08/11/2020: ALT 9 08/16/2020: BUN 15; Creatinine, Ser 1.21; Hemoglobin 8.7; Magnesium 1.9; Platelets 375; Potassium 4.0; Sodium 134   Recent Lipid Panel Lab Results  Component Value Date/Time   CHOL 127 08/05/2018 05:48 PM   TRIG 53 08/05/2018 05:48 PM   HDL 62 08/05/2018 05:48 PM   LDLCALC 54 08/05/2018 05:48 PM   LDLDIRECT 52 04/05/2012 12:10 PM      Risk Assessment/Calculations:   {Does this patient have ATRIAL FIBRILLATION?:[phone]}  Physical Exam:    VS:  There were no vitals taken for this visit.    Wt Readings from Last 3 Encounters:  08/16/20 121 lb 11.1 oz (55.2 kg)  07/23/20 119 lb 7.8 oz (54.2 kg)  06/04/20 125 lb (56.7 kg)     Physical Exam ***     ASSESSMENT & PLAN:    {Select for Dx:25819}    {Are you ordering a CV  Procedure (e.g. stress test, cath, DCCV, TEE, etc)?   Press F2        :[id number]}    Dispo:  No follow-ups on file.   Medication Adjustments/Labs and Tests Ordered: Current medicines are reviewed at length with the patient today.  Concerns regarding medicines are outlined above.  Tests Ordered: No orders of the defined types were placed in this encounter.  Medication Changes: No orders of the defined types were placed in this encounter.   Signed, Richardson Dopp, PA-C  08/24/2020 8:59 PM    [facility] [address], [place], [place]  [address] Phone: [phone]; Fax: [phone]

## 2020-08-24 NOTE — Telephone Encounter (Signed)
  Notes to clinic:  Patient requesting a 90 day supply Upcoming appt on 09/07/2020   Requested Prescriptions  Pending Prescriptions Disp Refills   omeprazole (PRILOSEC) 40 MG capsule [Pharmacy Med Name: OMEPRAZOLE 40MG  CAPSULES] 90 capsule     Sig: TAKE 1 CAPSULE(40 MG) BY MOUTH DAILY      Gastroenterology: Proton Pump Inhibitors Passed - 08/24/2020  8:13 AM      Passed - Valid encounter within last 12 months    Recent Outpatient Visits           2 months ago Heel ulcer, left, with fat layer exposed ([place])   [place], Charlane Ferretti, MD   3 months ago Edema of both legs   [place], Deborah B, MD   4 months ago Pelvic mass in female   [place], Enobong, MD   7 months ago Overactive bladder   [place], Enobong, MD   1 year ago Refused assessment of physical health   [facility], Delfino Lovett, NP       Future Appointments             Tomorrow [name], [name], PA-C [place], [facility]   In 2 weeks [name], MD [place]               montelukast (SINGULAIR) 10 MG tablet [Pharmacy Med Name: MONTELUKAST 10MG  TABLETS] 90 tablet     Sig: TAKE 1 TABLET(10 MG) BY MOUTH AT BEDTIME      Pulmonology:  Leukotriene Inhibitors Passed - 08/24/2020  8:13 AM      Passed - Valid encounter within last 12 months    Recent Outpatient Visits           2 months ago Heel ulcer, left, with fat layer exposed ([place])   [place], Charlane Ferretti, MD   3 months ago Edema of both legs   [place], Deborah B, MD   4 months ago Pelvic mass in female   [place], Enobong, MD   7 months ago Overactive bladder   [place], MD   1 year ago Refused assessment of physical health   [facility], Delfino Lovett, NP       Future Appointments             Tomorrow [name], [name], PA-C [place], [facility]   In 2 weeks [name], MD [place]

## 2020-08-24 NOTE — Telephone Encounter (Signed)
Requested medication (s) are due for refill today: see encounter   Requested medication (s) are on the active medication list: yes  Last refill:  08/24/20 #60 needs to come to upcoming appt in 2 weeks   Future visit scheduled: yes   Notes to clinic:  patient requesting 90 day supply. #60 0 refill signed today . Do you want to give 90 day supply?     Requested Prescriptions  Pending Prescriptions Disp Refills   oxybutynin (DITROPAN) 5 MG tablet [Pharmacy Med Name: OXYBUTYNIN 5MG  TABLETS] 180 tablet     Sig: TAKE 1 TABLET(5 MG) BY MOUTH TWICE DAILY      Urology:  Bladder Agents Passed - 08/24/2020 11:37 AM      Passed - Valid encounter within last 12 months    Recent Outpatient Visits           2 months ago Heel ulcer, left, with fat layer exposed ([place])   [place], Charlane Ferretti, MD   3 months ago Edema of both legs   [place], Deborah B, MD   4 months ago Pelvic mass in female   [place], Enobong, MD   7 months ago Overactive bladder   [place], MD   1 year ago Refused assessment of physical health   [facility], Delfino Lovett, NP       Future Appointments             Tomorrow [name], [name], PA-C [place], [facility]   In 2 weeks [name], MD [place]

## 2020-08-25 ENCOUNTER — Ambulatory Visit: Payer: 59 | Admitting: Physician Assistant

## 2020-08-25 DIAGNOSIS — E876 Hypokalemia: Secondary | ICD-10-CM

## 2020-08-25 DIAGNOSIS — N179 Acute kidney failure, unspecified: Secondary | ICD-10-CM

## 2020-08-25 DIAGNOSIS — I5032 Chronic diastolic (congestive) heart failure: Secondary | ICD-10-CM

## 2020-08-25 DIAGNOSIS — I10 Essential (primary) hypertension: Secondary | ICD-10-CM

## 2020-08-25 DIAGNOSIS — N1832 Chronic kidney disease, stage 3b: Secondary | ICD-10-CM

## 2020-08-26 ENCOUNTER — Telehealth: Payer: Self-pay | Admitting: Family Medicine

## 2020-08-26 NOTE — Telephone Encounter (Signed)
Copied from [place] [id number]. Topic: General - Other >> Aug 26, 2020 11:44 AM Tiffany [name] wrote: Reason for CRM: Patient would like to be prescribed [name] medication to help with their stomach discomfort and diarrhea   The patient shares that they were previously taking [name] medication to help with the discomfort while they were hospitalized roughly 2 weeks ago.  The patient would like to continue taking the medication but is unable to remember the name of it  Please contact further to discuss

## 2020-08-27 ENCOUNTER — Ambulatory Visit: Payer: Self-pay | Admitting: *Deleted

## 2020-08-27 NOTE — Telephone Encounter (Signed)
Please place on my 8:10am  schedule next week. Thanks

## 2020-08-27 NOTE — Telephone Encounter (Signed)
Patient called in stating unable to eat. Please call back Reason for Disposition  Patient sounds very sick or weak to the triager    Patient is unable to eat solid food for over 1 week. Patient states she is only eating one meal/day- because every time she eats- it goes through her. Patient states her diarrhea is watery. Patient states she has no pain and she is able to keep fluids in. Patient also states that she fell 3 times 2 days ago.Patient states she hit her head on portable toilet and has bruise and bump on her forehead. Patient states EMS came out every time- but she refused to go to ED. Patient most significant complaint is that she has lost the use of her left arm since the fall. She does not have pain or swelling in the arm from the fall. Patient advised she needs to go to ED for evaluation- she states she will have her mother take her.  Answer Assessment - Initial Assessment Questions 1. DIARRHEA SEVERITY: "How bad is the diarrhea?" "How many more stools have you had in the past 24 hours than normal?"    - NO DIARRHEA (SCALE 0)   - MILD (SCALE 1-3): Few loose or mushy BMs; increase of 1-3 stools over normal daily number of stools; mild increase in ostomy output.   -  MODERATE (SCALE 4-7): Increase of 4-6 stools daily over normal; moderate increase in ostomy output. * SEVERE (SCALE 8-10; OR 'WORST POSSIBLE'): Increase of 7 or more stools daily over normal; moderate increase in ostomy output; incontinence.     moderate 2. ONSET: "When did the diarrhea begin?"      1 week 3. BM CONSISTENCY: "How loose or watery is the diarrhea?"      watery 4. VOMITING: "Are you also vomiting?" If Yes, ask: "How many times in the past 24 hours?"      no 5. ABDOMINAL PAIN: "Are you having any abdominal pain?" If Yes, ask: "What does it feel like?" (e.g., crampy, dull, intermittent, constant)      No abdominal pain 6. ABDOMINAL PAIN SEVERITY: If present, ask: "How bad is the pain?"  (e.g., Scale 1-10;  mild, moderate, or severe)   - MILD (1-3): doesn't interfere with normal activities, abdomen soft and not tender to touch    - MODERATE (4-7): interferes with normal activities or awakens from sleep, abdomen tender to touch    - SEVERE (8-10): excruciating pain, doubled over, unable to do any normal activities       none 7. ORAL INTAKE: If vomiting, "Have you been able to drink liquids?" "How much liquids have you had in the past 24 hours?"     Yes- patient states she is able to drink fluids 8. HYDRATION: "Any signs of dehydration?" (e.g., dry mouth [not just dry lips], too weak to stand, dizziness, new weight loss) "When did you last urinate?"     no 9. EXPOSURE: "Have you traveled to a foreign country recently?" "Have you been exposed to anyone with diarrhea?" "Could you have eaten any food that was spoiled?"     no 10. ANTIBIOTIC USE: "Are you taking antibiotics now or have you taken antibiotics in the past 2 months?"       Yes- finished today 11. OTHER SYMPTOMS: "Do you have any other symptoms?" (e.g., fever, blood in stool)       Patient states she fell 3 times the other days- she did hit her head- she was unable to  get up- she laid there for 45 minutes.  12. PREGNANCY: "Is there any chance you are pregnant?" "When was your last menstrual period?"       N/a  Answer Assessment - Initial Assessment Questions 1. MECHANISM: "How did the injury happen?" For falls, ask: "What height did you fall from?" and "What surface did you fall against?"      3 times in the same day- 2 days ago- patient was walking and fell- hit head on forehead- toilet chair 2. ONSET: "When did the injury happen?" (Minutes or hours ago)      2 days ago 3. NEUROLOGIC SYMPTOMS: "Was there any loss of consciousness?" "Are there any other neurological symptoms?"      Not sure- patient was not able to get up immediate- lost use of left arm- not able to use arm 4. MENTAL STATUS: "Does the person know who they are, who you  are, and where they are?"      alert 5. LOCATION: "What part of the head was hit?"      R side of forehead 6. SCALP APPEARANCE: "What does the scalp look like? Is it bleeding now?" If Yes, ask: "Is it difficult to stop?"      Has bump on forehead- still present- discoloration 7. SIZE: For cuts, bruises, or swelling, ask: "How large is it?" (e.g., inches or centimeters)      Bruising- on head, left arm 8. PAIN: "Is there any pain?" If Yes, ask: "How bad is it?"  (e.g., Scale 1-10; or mild, moderate, severe)     No pain now 9. TETANUS: For any breaks in the skin, ask: "When was the last tetanus booster?"     N/a 10. OTHER SYMPTOMS: "Do you have any other symptoms?" (e.g., neck pain, vomiting)       no 11. PREGNANCY: "Is there any chance you are pregnant?" "When was your last menstrual period?"       N/a  Protocols used: Diarrhea-A-AH, Head Injury-A-AH

## 2020-08-31 ENCOUNTER — Encounter: Payer: Self-pay | Admitting: Family Medicine

## 2020-08-31 ENCOUNTER — Ambulatory Visit: Payer: 59 | Attending: Family Medicine | Admitting: Family Medicine

## 2020-08-31 ENCOUNTER — Other Ambulatory Visit: Payer: Self-pay

## 2020-08-31 DIAGNOSIS — R197 Diarrhea, unspecified: Secondary | ICD-10-CM

## 2020-08-31 MED ORDER — DICYCLOMINE HCL 10 MG PO CAPS: 10.0000 mg | ORAL_CAPSULE | Freq: Three times a day (TID) | ORAL | 0 refills | Status: DC

## 2020-08-31 NOTE — Progress Notes (Signed)
Virtual Visit via Telephone Note  I connected with Tiffany [name], on 08/31/2020 at 8:14 AM by telephone due to the COVID-19 pandemic and verified that I am speaking with the correct person using two identifiers.   Consent: I discussed the limitations, risks, security and privacy concerns of performing an evaluation and management service by telephone and the availability of in person appointments. I also discussed with the patient that there may be a patient responsible charge related to this service. The patient expressed understanding and agreed to proceed.   Location of Patient: Home  Location of Provider: Clinic   Persons participating in Telemedicine visit: [name] Dr. Margarita Rana     History of Present Illness: Tiffany [name] is a 60 y.o. year old female with a history of hypertension, COPD, schizoaffective disorder, dyslipidemia, stage III chronic kidney disease    She has had diarrhea 3-4 times/day for the last month with no abdominal cramp, no hematochezia and stool is loose with no blood. She denies recent medication changes but is on colchicine for gout and states she has been on this chronically.  Past Medical History:  Diagnosis Date   Agitation 11/22/2017   Anoxic brain injury ([place]) 09/08/2016   C. Arrest due to respiratory failure and COPD exacerbation   Anxiety    Arthritis    "all over" (04/10/2016)   Asthma 10/18/2010   Binge eating disorder    Cardiac arrest ([place]) 09/08/2016   PEA   Carotid artery stenosis    1-39% bilateral by dopplers 11/2016   Chronic diastolic (congestive) heart failure ([place])    Chronic kidney disease    "I see a kidney dr." (04/10/2016)   Chronic pain syndrome 06/18/2012   Chronic post-traumatic stress disorder (PTSD) 05/27/2018   Chronic respiratory failure with hypoxia and hypercapnia ([place]) 06/22/2015   TRILOGY Vent >AVAPA-ES., Vt target 200-400, Max P 30 , PS max 20 , PS min 6-10 , E Max 6, E Min 4, Rate Auto AVAPS Rate 2  (titrate for pt comfort) , bleed O2 at 5l/m continuous flow .    CKD (chronic kidney disease) stage 3, GFR 30-59 ml/min (HCC) 12/15/2016   Closed displaced fracture of fifth metacarpal bone 03/21/2018   Cocaine use disorder, severe, in sustained remission ([place]) 42/03/5359   Complication of anesthesia    decreased bp, decreased heart rate   COPD (chronic obstructive pulmonary disease) ([place]) 07/08/2014   Depression    Diabetic neuropathy ([place]) 04/24/2011   Difficulty with speech 01/24/2018   Disorder of nervous system    Drug abuse ([place]) 11/21/2017   Dyslipidemia 04/24/2011   Elevated troponin 04/28/2012   Emphysema    Encephalopathy 11/21/2017   Essential hypertension 03/22/2016   Fibula fracture 07/10/2016   Frequent falls 10/11/2017   GERD (gastroesophageal reflux disease)    Gout 04/11/2017   Heart attack ([place]) 1980s   History of blood transfusion 1994   "couldn't stop bleeding from my period"   History of drug abuse in remission ([place]) 11/28/2015   Quit in 2017   Hyperlipidemia LDL goal <70    Incontinence    Manic depression ([place])    Morbid obesity ([place]) 10/18/2010   Obstructive sleep apnea 10/18/2010   On home oxygen therapy    "6L; 24/7" (04/10/2016)   OSA on CPAP    "wear mask sometimes" (04/10/2016)   Paranoid ([place])    "sometimes; I'm on RX for it" (04/10/2016)   Prolonged Q-T interval on ECG  Rectal bleeding 12/31/2015   Right carotid bruit 11/09/2016   Schizoaffective disorder, bipolar type ([place]) 04/05/2018   Seasonal allergies    Seborrheic keratoses 12/31/2013   Seizures ([place])    "don't know what kind; last one was ~ 1 yr ago" (04/10/2016)   Stroke [facility]) 1980s   denies residual on 04/10/2016   Thrush 09/19/2013   Type 2 diabetes mellitus ([place]) 10/18/2010   Allergies  Allergen Reactions   Hydrocodone Shortness Of Breath   Hydroxyzine Anaphylaxis and Shortness Of Breath   Latuda [Lurasidone Hcl] Anaphylaxis   Magnesium-Containing Compounds Anaphylaxis    Tolerated Ensure    Prednisone Anaphylaxis, Swelling and Other (See Comments)    Tongue swelling, lip swelling, throat swelling, per pt    Tramadol Anaphylaxis and Swelling   Codeine Nausea And Vomiting   Sulfa Antibiotics Itching   Tape Rash    Current Outpatient Medications on File Prior to Visit  Medication Sig Dispense Refill   albuterol (PROVENTIL) (2.5 MG/3ML) 0.083% nebulizer solution Take 3 mLs (2.5 mg total) by nebulization every 6 (six) hours as needed for wheezing or shortness of breath. 360 mL 5   albuterol (VENTOLIN HFA) 108 (90 Base) MCG/ACT inhaler Inhale 2 puffs into the lungs every 6 (six) hours as needed for wheezing or shortness of breath. 18 g 5   amLODipine (NORVASC) 5 MG tablet Take 1 tablet (5 mg total) by mouth daily. 30 tablet 0   asenapine (SAPHRIS) 5 MG SUBL 24 hr tablet Place 2 tablets (10 mg total) under the tongue 2 (two) times daily. 60 tablet 1   colchicine 0.6 MG tablet Take 1 tablet (0.6 mg total) by mouth daily.     diclofenac (VOLTAREN) 75 MG EC tablet TAKE 1 TABLET(75 MG) BY MOUTH TWICE DAILY AS NEEDED 60 tablet 1   diclofenac Sodium (VOLTAREN) 1 % GEL Apply 4 g topically 4 (four) times daily. (Patient taking differently: Apply 4 g topically 4 (four) times daily as needed (pain).) 100 g 0   escitalopram (LEXAPRO) 10 MG tablet Take 1 tablet (10 mg total) by mouth daily. 30 tablet 2   febuxostat (ULORIC) 40 MG tablet Take 1 tablet (40 mg total) by mouth daily. 30 tablet 3   feeding supplement (ENSURE ENLIVE / ENSURE PLUS) LIQD Take 237 mLs by mouth 2 (two) times daily between meals.     Fluticasone-Umeclidin-Vilant (TRELEGY ELLIPTA) 100-62.5-25 MCG/INH AEPB Inhale 1 puff into the lungs daily. 60 each 3   furosemide (LASIX) 80 MG tablet Take 1 tablet (80 mg total) by mouth daily. 90 tablet 3   gabapentin (NEURONTIN) 600 MG tablet Take 0.5 tablets (300 mg total) by mouth 2 (two) times daily. 60 tablet 2   lidocaine (LIDODERM) 5 % Place 1 patch onto the skin daily. Remove & Discard  patch within 12 hours or as directed by MD 30 patch 1   Misc. Devices [place] Power wheelchair.  Diagnosis-frequent falls, bilateral knee osteoarthritis 1 each 0   Misc. Devices MISC Bedside Commode DX: frequent fall 1 each 0   montelukast (SINGULAIR) 10 MG tablet TAKE 1 TABLET(10 MG) BY MOUTH AT BEDTIME 30 tablet 0   Multiple Vitamin (MULTIVITAMIN WITH MINERALS) TABS tablet Take 1 tablet by mouth daily.     omeprazole (PRILOSEC) 40 MG capsule TAKE 1 CAPSULE(40 MG) BY MOUTH DAILY 30 capsule 0   oxybutynin (DITROPAN) 5 MG tablet TAKE 1 TABLET(5 MG) BY MOUTH TWICE DAILY 60 tablet 0   potassium chloride SA (KLOR-CON) 20 MEQ tablet  Take 2 tablets (40 mEq total) by mouth 2 (two) times daily. 180 tablet 3   prazosin (MINIPRESS) 2 MG capsule Take 2 mg by mouth at bedtime.     prazosin (MINIPRESS) 5 MG capsule Take 5 mg by mouth 2 (two) times daily.     thiamine 100 MG tablet Take 1 tablet (100 mg total) by mouth daily. (Patient not taking: Reported on 08/09/2020)     topiramate (TOPAMAX) 25 MG tablet Take 1 tablet (25 mg total) by mouth 2 (two) times daily. 60 tablet 0   VYVANSE 20 MG capsule Take 20 mg by mouth daily as needed (anxiety/ focusing).     Current Facility-Administered Medications on File Prior to Visit  Medication Dose Route Frequency Provider Last Rate Last Admin   betamethasone acetate-betamethasone sodium phosphate (CELESTONE) injection 6 mg  6 mg Other Once Magnus Sinning, MD        ROS: See HPI  Observations/Objective: Awake, alert, oriented x3 Not in acute distress Normal mood  CMP Latest Ref Rng & Units 08/16/2020 08/15/2020 08/14/2020  Glucose 70 - 99 mg/dL 77 81 83  BUN 8 - 23 mg/dL 15 13 12   Creatinine 0.44 - 1.00 mg/dL 1.21(H) 1.28(H) 1.23(H)  Sodium 135 - 145 mmol/L 134(L) 132(L) 130(L)  Potassium 3.5 - 5.1 mmol/L 4.0 3.9 3.9  Chloride 98 - 111 mmol/L 107 103 99  CO2 22 - 32 mmol/L 21(L) 22 26  Calcium 8.9 - 10.3 mg/dL 8.1(L) 8.1(L) 8.4(L)  Total Protein 6.5 - 8.1  g/dL - - -  Total Bilirubin 0.3 - 1.2 mg/dL - - -  Alkaline Phos 38 - 126 U/L - - -  AST 15 - 41 U/L - - -  ALT 0 - 44 U/L - - -    Lipid Panel     Component Value Date/Time   CHOL 127 08/05/2018 1748   TRIG 53 08/05/2018 1748   HDL 62 08/05/2018 1748   CHOLHDL 2.0 08/05/2018 1748   CHOLHDL 3.7 01/07/2017 0005   VLDL 38 01/07/2017 0005   LDLCALC 54 08/05/2018 1748   LDLDIRECT 52 04/05/2012 1210   LABVLDL 11 08/05/2018 1748    Lab Results  Component Value Date   HGBA1C 5.3 07/23/2020    Assessment and Plan: 1. Diarrhea, unspecified type Unknown etiology Trial of Bentyl - dicyclomine (BENTYL) 10 MG capsule; Take 1 capsule (10 mg total) by mouth 3 (three) times daily before meals.  Dispense: 90 capsule; Refill: 0   Follow Up Instructions: Keep previously scheduled appointment   I discussed the assessment and treatment plan with the patient. The patient was provided an opportunity to ask questions and all were answered. The patient agreed with the plan and demonstrated an understanding of the instructions.   The patient was advised to call back or seek an in-person evaluation if the symptoms worsen or if the condition fails to improve as anticipated.     I provided 11 minutes total of non-face-to-face time during this encounter.   [name], MD, FAAFP. [facility] and [place], [place]   08/31/2020, 8:14 AM

## 2020-09-06 ENCOUNTER — Ambulatory Visit: Payer: Self-pay | Admitting: *Deleted

## 2020-09-06 ENCOUNTER — Telehealth: Payer: Self-pay | Admitting: *Deleted

## 2020-09-06 NOTE — Telephone Encounter (Signed)
Pt has upcoming appointment 09/07/20

## 2020-09-06 NOTE — Telephone Encounter (Signed)
C/o bilateral foot pain, itching, burning and numbness for  months. Now pain getting worse and c/o difficulty walking. Reports she thinks she has athlete's foot. Denies  fever, rash, skin peeling. Some redness reported to top of feet. Reports swelling to "little" toes on both feet and feels toes on both feet are starting to curl down. Pain in stabbing pain to bottom of feet. Virtual My Chart visit scheduled for 09/07/20. Care advise given. Patient verbalized understanding of care advise and to call back if needed or symptoms worsen.

## 2020-09-06 NOTE — Telephone Encounter (Signed)
Copied from [place] [id number]. Topic: General - Other >> Sep 03, 2020 11:27 AM Tiffany [name] wrote: Reason for CRM: Pt stated she has not heard from anyone in regards to her requests for handrails. Pt requests call back

## 2020-09-06 NOTE — Telephone Encounter (Signed)
Reason for Disposition  [1] SEVERE pain (e.g., excruciating, unable to do any normal activities) AND [2] not improved after 2 hours of pain medicine  Answer Assessment - Initial Assessment Questions 1. ONSET: "When did the pain start?"      Months ago and getting worse  2. LOCATION: "Where is the pain located?"      Bilateral feet 3. PAIN: "How bad is the pain?"    (Scale 1-10; or mild, moderate, severe)  - MILD (1-3): doesn't interfere with normal activities.   - MODERATE (4-7): interferes with normal activities (e.g., work or school) or awakens from sleep, limping.   - SEVERE (8-10): excruciating pain, unable to do any normal activities, unable to walk.      Moderate , difficulty walking  4. WORK OR EXERCISE: "Has there been any recent work or exercise that involved this part of the body?"      Na 5. CAUSE: "What do you think is causing the foot pain?"     "Athlete's foot" 6. OTHER SYMPTOMS: "Do you have any other symptoms?" (e.g., leg pain, rash, fever, numbness)     Bilateral foot pain, itching, burning, swelling to "little" toes.  7. PREGNANCY: "Is there any chance you are pregnant?" "When was your last menstrual period?"     na  Protocols used: Foot Pain-A-AH

## 2020-09-07 ENCOUNTER — Ambulatory Visit: Payer: 59 | Attending: Family Medicine | Admitting: Family Medicine

## 2020-09-07 ENCOUNTER — Encounter: Payer: Self-pay | Admitting: Family Medicine

## 2020-09-07 ENCOUNTER — Other Ambulatory Visit: Payer: Self-pay

## 2020-09-07 DIAGNOSIS — B353 Tinea pedis: Secondary | ICD-10-CM | POA: Diagnosis not present

## 2020-09-07 DIAGNOSIS — R296 Repeated falls: Secondary | ICD-10-CM

## 2020-09-07 DIAGNOSIS — G629 Polyneuropathy, unspecified: Secondary | ICD-10-CM | POA: Diagnosis not present

## 2020-09-07 MED ORDER — TERBINAFINE HCL 1 % EX CREA: 1.0000 "application " | TOPICAL_CREAM | Freq: Two times a day (BID) | CUTANEOUS | 1 refills | Status: DC

## 2020-09-07 MED ORDER — GABAPENTIN 600 MG PO TABS: 600.0000 mg | ORAL_TABLET | Freq: Two times a day (BID) | ORAL | 2 refills | Status: DC

## 2020-09-07 NOTE — Progress Notes (Signed)
Virtual Visit via Telephone Note  I connected with Tiffany [name], on 09/07/2020 at 10:54 AM by telephone due to the COVID-19 pandemic and verified that I am speaking with the correct person using two identifiers.   Consent: I discussed the limitations, risks, security and privacy concerns of performing an evaluation and management service by telephone and the availability of in person appointments. I also discussed with the patient that there may be a patient responsible charge related to this service. The patient expressed understanding and agreed to proceed.   Location of Patient: Home  Location of Provider: Clinic   Persons participating in Telemedicine visit: Tiffany [name] Dr. Margarita Rana     History of Present Illness: [name] is a 60 y.o. year old female with a history of hypertension, COPD, schizoaffective disorder, dyslipidemia, stage III chronic kidney disease.   Complains her 'feet are not getting enough circulation'. Her feet burn and itch around the dorsum and sole. She has noticed whitish discoloration in her web spaces. Her feet are sensitive to touch.  Currently on gabapentin 300 mg 3 times daily which she states has been ineffective.  States her [place] will need a note to construct a ramp for her as she has been falling and the ramp would prevent her from falling.  Past Medical History:  Diagnosis Date   Agitation 11/22/2017   Anoxic brain injury ([place]) 09/08/2016   C. Arrest due to respiratory failure and COPD exacerbation   Anxiety    Arthritis    "all over" (04/10/2016)   Asthma 10/18/2010   Binge eating disorder    Cardiac arrest ([place]) 09/08/2016   PEA   Carotid artery stenosis    1-39% bilateral by dopplers 11/2016   Chronic diastolic (congestive) heart failure ([place])    Chronic kidney disease    "I see a kidney dr." (04/10/2016)   Chronic pain syndrome 06/18/2012   Chronic post-traumatic stress disorder (PTSD) 05/27/2018   Chronic respiratory  failure with hypoxia and hypercapnia ([place]) 06/22/2015   TRILOGY Vent >AVAPA-ES., Vt target 200-400, Max P 30 , PS max 20 , PS min 6-10 , E Max 6, E Min 4, Rate Auto AVAPS Rate 2 (titrate for pt comfort) , bleed O2 at 5l/m continuous flow .    CKD (chronic kidney disease) stage 3, GFR 30-59 ml/min (HCC) 12/15/2016   Closed displaced fracture of fifth metacarpal bone 03/21/2018   Cocaine use disorder, severe, in sustained remission ([place]) 72/0/9470   Complication of anesthesia    decreased bp, decreased heart rate   COPD (chronic obstructive pulmonary disease) ([place]) 07/08/2014   Depression    Diabetic neuropathy ([place]) 04/24/2011   Difficulty with speech 01/24/2018   Disorder of nervous system    Drug abuse ([place]) 11/21/2017   Dyslipidemia 04/24/2011   Elevated troponin 04/28/2012   Emphysema    Encephalopathy 11/21/2017   Essential hypertension 03/22/2016   Fibula fracture 07/10/2016   Frequent falls 10/11/2017   GERD (gastroesophageal reflux disease)    Gout 04/11/2017   Heart attack ([place]) 1980s   History of blood transfusion 1994   "couldn't stop bleeding from my period"   History of drug abuse in remission ([place]) 11/28/2015   Quit in 2017   Hyperlipidemia LDL goal <70    Incontinence    Manic depression ([place])    Morbid obesity ([place]) 10/18/2010   Obstructive sleep apnea 10/18/2010   On home oxygen therapy    "6L; 24/7" (04/10/2016)   OSA on  CPAP    "wear mask sometimes" (04/10/2016)   Paranoid ([place])    "sometimes; I'm on RX for it" (04/10/2016)   Prolonged Q-T interval on ECG    Rectal bleeding 12/31/2015   Right carotid bruit 11/09/2016   Schizoaffective disorder, bipolar type ([place]) 04/05/2018   Seasonal allergies    Seborrheic keratoses 12/31/2013   Seizures ([place])    "don't know what kind; last one was ~ 1 yr ago" (04/10/2016)   Stroke [facility]) 1980s   denies residual on 04/10/2016   Thrush 09/19/2013   Type 2 diabetes mellitus ([place]) 10/18/2010   Allergies  Allergen Reactions   Hydrocodone  Shortness Of Breath   Hydroxyzine Anaphylaxis and Shortness Of Breath   Latuda [Lurasidone Hcl] Anaphylaxis   Magnesium-Containing Compounds Anaphylaxis    Tolerated Ensure   Prednisone Anaphylaxis, Swelling and Other (See Comments)    Tongue swelling, lip swelling, throat swelling, per pt    Tramadol Anaphylaxis and Swelling   Codeine Nausea And Vomiting   Sulfa Antibiotics Itching   Tape Rash    Current Outpatient Medications on File Prior to Visit  Medication Sig Dispense Refill   albuterol (PROVENTIL) (2.5 MG/3ML) 0.083% nebulizer solution Take 3 mLs (2.5 mg total) by nebulization every 6 (six) hours as needed for wheezing or shortness of breath. 360 mL 5   albuterol (VENTOLIN HFA) 108 (90 Base) MCG/ACT inhaler Inhale 2 puffs into the lungs every 6 (six) hours as needed for wheezing or shortness of breath. 18 g 5   amLODipine (NORVASC) 5 MG tablet Take 1 tablet (5 mg total) by mouth daily. 30 tablet 0   asenapine (SAPHRIS) 5 MG SUBL 24 hr tablet Place 2 tablets (10 mg total) under the tongue 2 (two) times daily. 60 tablet 1   colchicine 0.6 MG tablet Take 1 tablet (0.6 mg total) by mouth daily.     diclofenac (VOLTAREN) 75 MG EC tablet TAKE 1 TABLET(75 MG) BY MOUTH TWICE DAILY AS NEEDED 60 tablet 1   diclofenac Sodium (VOLTAREN) 1 % GEL Apply 4 g topically 4 (four) times daily. (Patient taking differently: Apply 4 g topically 4 (four) times daily as needed (pain).) 100 g 0   dicyclomine (BENTYL) 10 MG capsule Take 1 capsule (10 mg total) by mouth 3 (three) times daily before meals. 90 capsule 0   escitalopram (LEXAPRO) 10 MG tablet Take 1 tablet (10 mg total) by mouth daily. 30 tablet 2   febuxostat (ULORIC) 40 MG tablet Take 1 tablet (40 mg total) by mouth daily. 30 tablet 3   feeding supplement (ENSURE ENLIVE / ENSURE PLUS) LIQD Take 237 mLs by mouth 2 (two) times daily between meals.     Fluticasone-Umeclidin-Vilant (TRELEGY ELLIPTA) 100-62.5-25 MCG/INH AEPB Inhale 1 puff into the  lungs daily. 60 each 3   furosemide (LASIX) 80 MG tablet Take 1 tablet (80 mg total) by mouth daily. 90 tablet 3   gabapentin (NEURONTIN) 600 MG tablet Take 0.5 tablets (300 mg total) by mouth 2 (two) times daily. 60 tablet 2   lidocaine (LIDODERM) 5 % Place 1 patch onto the skin daily. Remove & Discard patch within 12 hours or as directed by MD 30 patch 1   Misc. Devices [place] Power wheelchair.  Diagnosis-frequent falls, bilateral knee osteoarthritis 1 each 0   Misc. Devices MISC Bedside Commode DX: frequent fall 1 each 0   montelukast (SINGULAIR) 10 MG tablet TAKE 1 TABLET(10 MG) BY MOUTH AT BEDTIME 30 tablet 0   Multiple Vitamin (MULTIVITAMIN  WITH MINERALS) TABS tablet Take 1 tablet by mouth daily.     omeprazole (PRILOSEC) 40 MG capsule TAKE 1 CAPSULE(40 MG) BY MOUTH DAILY 30 capsule 0   oxybutynin (DITROPAN) 5 MG tablet TAKE 1 TABLET(5 MG) BY MOUTH TWICE DAILY 60 tablet 0   potassium chloride SA (KLOR-CON) 20 MEQ tablet Take 2 tablets (40 mEq total) by mouth 2 (two) times daily. 180 tablet 3   prazosin (MINIPRESS) 2 MG capsule Take 2 mg by mouth at bedtime.     prazosin (MINIPRESS) 5 MG capsule Take 5 mg by mouth 2 (two) times daily.     thiamine 100 MG tablet Take 1 tablet (100 mg total) by mouth daily. (Patient not taking: Reported on 08/09/2020)     topiramate (TOPAMAX) 25 MG tablet Take 1 tablet (25 mg total) by mouth 2 (two) times daily. 60 tablet 0   VYVANSE 20 MG capsule Take 20 mg by mouth daily as needed (anxiety/ focusing).     Current Facility-Administered Medications on File Prior to Visit  Medication Dose Route Frequency Provider Last Rate Last Admin   betamethasone acetate-betamethasone sodium phosphate (CELESTONE) injection 6 mg  6 mg Other Once Magnus Sinning, MD        ROS: See HPI  Observations/Objective: Awake, alert, oriented x3 Not in acute distress Normal mood  CMP Latest Ref Rng & Units 08/16/2020 08/15/2020 08/14/2020  Glucose 70 - 99 mg/dL 77 81 83  BUN 8 -  23 mg/dL 15 13 12   Creatinine 0.44 - 1.00 mg/dL 1.21(H) 1.28(H) 1.23(H)  Sodium 135 - 145 mmol/L 134(L) 132(L) 130(L)  Potassium 3.5 - 5.1 mmol/L 4.0 3.9 3.9  Chloride 98 - 111 mmol/L 107 103 99  CO2 22 - 32 mmol/L 21(L) 22 26  Calcium 8.9 - 10.3 mg/dL 8.1(L) 8.1(L) 8.4(L)  Total Protein 6.5 - 8.1 g/dL - - -  Total Bilirubin 0.3 - 1.2 mg/dL - - -  Alkaline Phos 38 - 126 U/L - - -  AST 15 - 41 U/L - - -  ALT 0 - 44 U/L - - -    Lipid Panel     Component Value Date/Time   CHOL 127 08/05/2018 1748   TRIG 53 08/05/2018 1748   HDL 62 08/05/2018 1748   CHOLHDL 2.0 08/05/2018 1748   CHOLHDL 3.7 01/07/2017 0005   VLDL 38 01/07/2017 0005   LDLCALC 54 08/05/2018 1748   LDLDIRECT 52 04/05/2012 1210   LABVLDL 11 08/05/2018 1748    Lab Results  Component Value Date   HGBA1C 5.3 07/23/2020    Assessment and Plan: 1. Tinea pedis of both feet - terbinafine (LAMISIL AT) 1 % cream; Apply 1 application topically 2 (two) times daily.  Dispense: 30 g; Refill: 1  2. Neuropathy Uncontrolled Increase gabapentin dose - gabapentin (NEURONTIN) 600 MG tablet; Take 1 tablet (600 mg total) by mouth 2 (two) times daily.  Dispense: 60 tablet; Refill: 2  3. Frequent falls [name] at high risk for falls I have provided letter for her landlord as requested   Follow Up Instructions: Keep previously scheduled appointment   I discussed the assessment and treatment plan with the patient. The patient was provided an opportunity to ask questions and all were answered. The patient agreed with the plan and demonstrated an understanding of the instructions.   The patient was advised to call back or seek an in-person evaluation if the symptoms worsen or if the condition fails to improve as anticipated.  I provided 12 minutes total of non-face-to-face time during this encounter.   [name], MD, FAAFP. [facility] and [place], [place]   09/07/2020,  10:54 AM

## 2020-09-07 NOTE — Telephone Encounter (Signed)
Call placed to patient and informed her that the letter from Dr Margarita Rana regarding the hand rails is ready for pick up.  She confirmed that she did not want it mailed to her.  Explained to her that it will be left at the front desk of [facility] in an envelope with her name on it.

## 2020-09-08 ENCOUNTER — Telehealth: Payer: Self-pay | Admitting: Family Medicine

## 2020-09-08 NOTE — Telephone Encounter (Signed)
Pt seen dr Margarita Rana yesterday for feet fungus and she can not afford terbinafine cream and would like something cheaper sent to [place] [address] phone number [phone]

## 2020-09-09 ENCOUNTER — Telehealth: Payer: Self-pay | Admitting: Family Medicine

## 2020-09-09 NOTE — Telephone Encounter (Signed)
[facility] [place], please what other cheaper options are available? Thanks

## 2020-09-09 NOTE — Telephone Encounter (Signed)
Pt states that she can not afford the medication would like something different.

## 2020-09-09 NOTE — Telephone Encounter (Signed)
Copied from [place] [id number]. Topic: General - Other >> Sep 07, 2020  9:39 AM Celene Kras wrote: Reason for CRM: Pt called stating that she is needing to speak with social worker regarding the solutions to some of the problems they discussed last time. Pt did not want to go into further detail. Please advise.

## 2020-09-10 NOTE — Telephone Encounter (Signed)
I haven't. I ended up forwarding that previous message to her LCSW with [facility]. This may be who she is referring to.

## 2020-09-10 NOTE — Telephone Encounter (Signed)
I contacted the patient after contacting [facility].  [facility] brand Terbinafine cream is $13.99 for a 30gram tube, medication is not covered by insurance.  Patient states she did buy the OTC [facility] brand and the tube is too small, barely covers anything.  She doesn't feel like it is working and it burns.  She feels that the tubes over the counter aren't enough to cover both feet and she doesn't want to keep paying $14.  [name] wanting something prescribed that is covered by her insurance so that she can get a bigger quantity for less.  I told her I would see if there was anything else and I would call her back if there was going to be a change. Clotrimazole would be another option, but then you run into the same problem.  It will not be covered under ins.

## 2020-09-13 ENCOUNTER — Encounter (HOSPITAL_COMMUNITY): Payer: Self-pay | Admitting: Psychiatry

## 2020-09-13 ENCOUNTER — Other Ambulatory Visit: Payer: Self-pay

## 2020-09-13 ENCOUNTER — Ambulatory Visit (INDEPENDENT_AMBULATORY_CARE_PROVIDER_SITE_OTHER): Payer: 59 | Admitting: Psychiatry

## 2020-09-13 ENCOUNTER — Telehealth: Payer: Self-pay | Admitting: Family Medicine

## 2020-09-13 DIAGNOSIS — F250 Schizoaffective disorder, bipolar type: Secondary | ICD-10-CM
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
## 2020-09-13 NOTE — Telephone Encounter (Signed)
Call placed to patient.  She said she called because she is interested in housing options. She has not contacted any agency about this need.  She reported that her income is $1800/month and she can spend up to $800/month.   Informed her that this CM can provide her with a listing of available properties from [email or web address]  She was was very appreciative and said she could pick up the information at [facility].  Informed her that the information would be left in an envelope at [facility] front desk.  Also included with the property listings are the phone numbers for [employer] and [employer].

## 2020-09-13 NOTE — Telephone Encounter (Signed)
Pt called in for assistance. Pt says that she is currently prescribed Tizanidine 4 MG. Pt says that medication isn't helping her spasms. Pt says that she need a muscle relaxer because the medication is causing her feet turn under.     Pharmacy:   [facility], [place] - [address]  [place], [place] [address]  Phone:  [phone]  Fax:  [phone]

## 2020-09-14 ENCOUNTER — Ambulatory Visit: Payer: Self-pay | Admitting: *Deleted

## 2020-09-14 NOTE — Telephone Encounter (Signed)
Reason for Disposition  [1] Caller has NON-URGENT medicine question about med that PCP prescribed AND [2] triager unable to answer question  Answer Assessment - Initial Assessment Questions 1. NAME of MEDICATION: "What medicine are you calling about?"     Zanaflex  2. QUESTION: "What is your question?" (e.g., double dose of medicine, side effect)     She said the medicine is causing her feet to be swollen.   And also causing her "feet to turn under".   3. PRESCRIBING HCP: "Who prescribed it?" Reason: if prescribed by specialist, call should be referred to that group.     Dr. Margarita Rana 4. SYMPTOMS: "Do you have any symptoms?"     Feet are curling under and I can't control it.   It makes my feet hurt.   "My toes curl under because of the medicine"    5. SEVERITY: If symptoms are present, ask "Are they mild, moderate or severe?"     Moderate 6. PREGNANCY:  "Is there any chance that you are pregnant?" "When was your last menstrual period?"     N/A due to age  Protocols used: Medication Question Call-A-AH

## 2020-09-14 NOTE — Telephone Encounter (Signed)
Pt called in c/o the Zanaflex causing both of her feet to swell, hurt and her toes to curl under.   She called yesterday regarding this same issue.   I let her know I would send this to Dr. Margarita Rana and that we are waiting for Dr. Smitty Pluck response.   Someone should be calling her back hopefully today. She was agreeable to this plan.  I forwarded her request to Dr. Margarita Rana at [facility].

## 2020-09-15 MED ORDER — CYCLOBENZAPRINE HCL 10 MG PO TABS: 10.0000 mg | ORAL_TABLET | Freq: Two times a day (BID) | ORAL | 1 refills | Status: DC | PRN

## 2020-09-15 NOTE — Addendum Note (Signed)
Addended by: [name] on: 09/15/2020 08:47 AM   Modules accepted: Orders

## 2020-09-15 NOTE — Telephone Encounter (Unsigned)
Please see previous encounter. Patient has another Rx sent to pharmacy by Dr. Margarita Rana.

## 2020-09-15 NOTE — Telephone Encounter (Signed)
Attempt to call patient to inform of medication change.  No answer.

## 2020-09-15 NOTE — Telephone Encounter (Signed)
I do not see Zanaflex on her med list.  Anyway I have sent a prescription for Flexeril to her pharmacy and she should discontinue Zanaflex.

## 2020-09-16 NOTE — Telephone Encounter (Signed)
Unable to reach patient.  No answer.

## 2020-09-17 NOTE — Telephone Encounter (Signed)
Is there anything I need to assist with?

## 2020-09-20 ENCOUNTER — Other Ambulatory Visit: Payer: Self-pay | Admitting: Family Medicine

## 2020-09-20 DIAGNOSIS — K219 Gastro-esophageal reflux disease without esophagitis: Secondary | ICD-10-CM

## 2020-09-21 ENCOUNTER — Ambulatory Visit (HOSPITAL_COMMUNITY): Payer: 59 | Admitting: Psychiatry

## 2020-09-21 DIAGNOSIS — F250 Schizoaffective disorder, bipolar type: Secondary | ICD-10-CM

## 2020-09-22 ENCOUNTER — Other Ambulatory Visit: Payer: Self-pay | Admitting: Family Medicine

## 2020-09-23 ENCOUNTER — Encounter (HOSPITAL_COMMUNITY): Payer: Self-pay | Admitting: Psychiatry

## 2020-09-23 NOTE — Progress Notes (Signed)
Client was scheduled for the wrong time in the system. Counselor to let know of next appointment.   Lise Auer, LCSW

## 2020-09-24 ENCOUNTER — Encounter: Payer: Self-pay | Admitting: Clinical

## 2020-09-26 ENCOUNTER — Other Ambulatory Visit: Payer: Self-pay | Admitting: Internal Medicine

## 2020-09-26 ENCOUNTER — Other Ambulatory Visit (HOSPITAL_COMMUNITY): Payer: Self-pay | Admitting: Psychiatry

## 2020-10-04 ENCOUNTER — Telehealth (HOSPITAL_COMMUNITY): Payer: Medicare HMO | Admitting: Psychiatry

## 2020-10-04 ENCOUNTER — Other Ambulatory Visit: Payer: Self-pay

## 2020-10-04 NOTE — Progress Notes (Unsigned)
10/04/2020  2, 50 PM  PM  Patient had scheduled phone based medication management appointment today. Writer made several attempts to contact her via phone -phone signal busy/ no answer   (Noted per chart review patient missed 2 prior appts with Probation officer )  Will ask staff to contact patient to determine if she wants to reschedule and appointment  with me / send letter out to her with request she make an appointment  and providing her with other community psychiatric resources   F Abdoul Encinas MD

## 2020-10-07 ENCOUNTER — Other Ambulatory Visit: Payer: Self-pay

## 2020-10-07 ENCOUNTER — Ambulatory Visit: Payer: Medicare HMO | Attending: Family Medicine | Admitting: Family Medicine

## 2020-10-07 ENCOUNTER — Encounter: Payer: Self-pay | Admitting: Family Medicine

## 2020-10-07 VITALS — BP 144/97 | HR 74 | Resp 16

## 2020-10-07 DIAGNOSIS — K219 Gastro-esophageal reflux disease without esophagitis: Secondary | ICD-10-CM | POA: Diagnosis not present

## 2020-10-07 DIAGNOSIS — R197 Diarrhea, unspecified: Secondary | ICD-10-CM | POA: Diagnosis not present

## 2020-10-07 DIAGNOSIS — M79671 Pain in right foot: Secondary | ICD-10-CM

## 2020-10-07 DIAGNOSIS — I10 Essential (primary) hypertension: Secondary | ICD-10-CM

## 2020-10-07 DIAGNOSIS — G629 Polyneuropathy, unspecified: Secondary | ICD-10-CM

## 2020-10-07 DIAGNOSIS — F250 Schizoaffective disorder, bipolar type: Secondary | ICD-10-CM | POA: Diagnosis not present

## 2020-10-07 DIAGNOSIS — B353 Tinea pedis: Secondary | ICD-10-CM | POA: Diagnosis not present

## 2020-10-07 DIAGNOSIS — D649 Anemia, unspecified: Secondary | ICD-10-CM

## 2020-10-07 DIAGNOSIS — M79672 Pain in left foot: Secondary | ICD-10-CM

## 2020-10-07 DIAGNOSIS — Z1211 Encounter for screening for malignant neoplasm of colon: Secondary | ICD-10-CM

## 2020-10-07 DIAGNOSIS — M109 Gout, unspecified: Secondary | ICD-10-CM

## 2020-10-07 MED ORDER — CYCLOBENZAPRINE HCL 10 MG PO TABS: 10.0000 mg | ORAL_TABLET | Freq: Two times a day (BID) | ORAL | 6 refills | Status: DC | PRN

## 2020-10-07 MED ORDER — FEBUXOSTAT 40 MG PO TABS: 40.0000 mg | ORAL_TABLET | Freq: Every day | ORAL | 6 refills | Status: DC

## 2020-10-07 MED ORDER — COLCHICINE 0.6 MG PO TABS: 0.6000 mg | ORAL_TABLET | Freq: Every day | ORAL | 6 refills | Status: DC

## 2020-10-07 MED ORDER — GABAPENTIN 600 MG PO TABS: 600.0000 mg | ORAL_TABLET | Freq: Two times a day (BID) | ORAL | 6 refills | Status: DC

## 2020-10-07 MED ORDER — AMLODIPINE BESYLATE 5 MG PO TABS: 5.0000 mg | ORAL_TABLET | Freq: Every day | ORAL | 6 refills | Status: DC

## 2020-10-07 MED ORDER — OMEPRAZOLE 40 MG PO CPDR: DELAYED_RELEASE_CAPSULE | ORAL | 6 refills | Status: DC

## 2020-10-07 MED ORDER — MONTELUKAST SODIUM 10 MG PO TABS: ORAL_TABLET | ORAL | 6 refills | Status: DC

## 2020-10-07 MED ORDER — DICYCLOMINE HCL 10 MG PO CAPS: 10.0000 mg | ORAL_CAPSULE | Freq: Three times a day (TID) | ORAL | 6 refills | Status: DC

## 2020-10-07 MED ORDER — COLCHICINE 0.6 MG PO TABS: ORAL_TABLET | ORAL | 6 refills | Status: DC

## 2020-10-07 MED ORDER — OXYBUTYNIN CHLORIDE 5 MG PO TABS: ORAL_TABLET | ORAL | 6 refills | Status: DC

## 2020-10-07 NOTE — Progress Notes (Signed)
Subjective:  Patient ID: Tiffany [name], female    DOB: [birth year]  Age: 60 y.o. MRN: [id number]  CC: Hypertension   HPI Tiffany [name] is a 60 y.o. year old female with a history of hypertension, COPD, schizoaffective disorder, dyslipidemia, stage III chronic kidney disease.  Interval History: She presents to today's visit accompanied by her mother complaining of feet pain. Her feet recently started hurting with associated throbbing, burning, itching, has been uncomfortable and symptoms have been going on for months per patient. At home she does not wear shoes but allows her feet to get here.  At her last visit I had prescribed her Lamisil for tinea pedis. [name] also on gabapentin for neuropathy but states symptoms are not controlled on gabapentin.  [name] also concerned about curling of the toes of her left foot which she noticed recently. She has chronic pain in her legs and has chronic pedal edema.  She informs me she elevates her legs while sitting.  Med list reveals she is on Lasix.  She requests refills of her chronic medication.  Compliant with amlodipine for hypertension and is doing well on her current medications with no recent flare.  Schizoaffective disorder is managed by psychiatry.  She takes Bentyl for chronic diarrhea Past Medical History:  Diagnosis Date   Agitation 11/22/2017   Anoxic brain injury ([place]) 09/08/2016   C. Arrest due to respiratory failure and COPD exacerbation   Anxiety    Arthritis    "all over" (04/10/2016)   Asthma 10/18/2010   Binge eating disorder    Cardiac arrest ([place]) 09/08/2016   PEA   Carotid artery stenosis    1-39% bilateral by dopplers 11/2016   Chronic diastolic (congestive) heart failure ([place])    Chronic kidney disease    "I see a kidney dr." (04/10/2016)   Chronic pain syndrome 06/18/2012   Chronic post-traumatic stress disorder (PTSD) 05/27/2018   Chronic respiratory failure with hypoxia and hypercapnia ([place]) 06/22/2015   TRILOGY Vent  >AVAPA-ES., Vt target 200-400, Max P 30 , PS max 20 , PS min 6-10 , E Max 6, E Min 4, Rate Auto AVAPS Rate 2 (titrate for pt comfort) , bleed O2 at 5l/m continuous flow .    CKD (chronic kidney disease) stage 3, GFR 30-59 ml/min (HCC) 12/15/2016   Closed displaced fracture of fifth metacarpal bone 03/21/2018   Cocaine use disorder, severe, in sustained remission ([place]) 31/05/1759   Complication of anesthesia    decreased bp, decreased heart rate   COPD (chronic obstructive pulmonary disease) ([place]) 07/08/2014   Depression    Diabetic neuropathy ([place]) 04/24/2011   Difficulty with speech 01/24/2018   Disorder of nervous system    Drug abuse ([place]) 11/21/2017   Dyslipidemia 04/24/2011   Elevated troponin 04/28/2012   Emphysema    Encephalopathy 11/21/2017   Essential hypertension 03/22/2016   Fibula fracture 07/10/2016   Frequent falls 10/11/2017   GERD (gastroesophageal reflux disease)    Gout 04/11/2017   Heart attack ([place]) 1980s   History of blood transfusion 1994   "couldn't stop bleeding from my period"   History of drug abuse in remission ([place]) 11/28/2015   Quit in 2017   Hyperlipidemia LDL goal <70    Incontinence    Manic depression ([place])    Morbid obesity ([place]) 10/18/2010   Obstructive sleep apnea 10/18/2010   On home oxygen therapy    "6L; 24/7" (04/10/2016)   OSA on CPAP    "wear mask  sometimes" (04/10/2016)   Paranoid ([place])    "sometimes; I'm on RX for it" (04/10/2016)   Prolonged Q-T interval on ECG    Rectal bleeding 12/31/2015   Right carotid bruit 11/09/2016   Schizoaffective disorder, bipolar type ([place]) 04/05/2018   Seasonal allergies    Seborrheic keratoses 12/31/2013   Seizures ([place])    "don't know what kind; last one was ~ 1 yr ago" (04/10/2016)   Stroke [facility]) 1980s   denies residual on 04/10/2016   Thrush 09/19/2013   Type 2 diabetes mellitus ([place]) 10/18/2010    Past Surgical History:  Procedure Laterality Date   [place]     IR CHOLANGIOGRAM  EXISTING TUBE  07/20/2016   IR PERC CHOLECYSTOSTOMY  05/10/2016   IR RADIOLOGIST EVAL & MGMT  06/08/2016   IR RADIOLOGIST EVAL & MGMT  06/29/2016   IR SINUS/FIST TUBE CHK-NON GI  07/12/2016   RIGHT/LEFT HEART CATH AND CORONARY ANGIOGRAPHY N/A 06/19/2017   Procedure: RIGHT/LEFT HEART CATH AND CORONARY ANGIOGRAPHY;  Surgeon: Jolaine Artist, MD;  Location: [facility];  Service: Cardiovascular;  Laterality: N/A;   TIBIA IM NAIL INSERTION Right 07/12/2016   Procedure: INTRAMEDULLARY (IM) NAIL RIGHT TIBIA;  Surgeon: Leandrew Koyanagi, MD;  Location: [facility];  Service: Orthopedics;  Laterality: Right;   UMBILICAL HERNIA REPAIR  ~ 1963   "that's why I don't have a belly button"   VAGINAL HYSTERECTOMY      Family History  Problem Relation Age of Onset   Cancer Father        prostate   Cancer Mother        lung   Depression Mother    Depression Sister    Anxiety disorder Sister    Schizophrenia Sister    Bipolar disorder Sister    Depression Sister    Depression Brother    Heart failure Other        cousin    Allergies  Allergen Reactions   Hydrocodone Shortness Of Breath   Hydroxyzine Anaphylaxis and Shortness Of Breath   Latuda [Lurasidone Hcl] Anaphylaxis   Magnesium-Containing Compounds Anaphylaxis    Tolerated Ensure   Prednisone Anaphylaxis, Swelling and Other (See Comments)    Tongue swelling, lip swelling, throat swelling, per pt    Tramadol Anaphylaxis and Swelling   Codeine Nausea And Vomiting   Sulfa Antibiotics Itching   Tape Rash    Outpatient Medications Prior to Visit  Medication Sig Dispense Refill   albuterol (PROVENTIL) (2.5 MG/3ML) 0.083% nebulizer solution Take 3 mLs (2.5 mg total) by nebulization every 6 (six) hours as needed for wheezing or shortness of breath. 360 mL 5   albuterol (VENTOLIN HFA) 108 (90 Base) MCG/ACT inhaler Inhale 2 puffs into the lungs every 6 (six) hours as needed for wheezing or shortness of breath. 18 g 5   amLODipine (NORVASC) 5 MG  tablet Take 1 tablet (5 mg total) by mouth daily. 30 tablet 0   asenapine (SAPHRIS) 5 MG SUBL 24 hr tablet Place 2 tablets (10 mg total) under the tongue 2 (two) times daily. 60 tablet 1   colchicine 0.6 MG tablet Take 1 tablet (0.6 mg total) by mouth daily.     cyclobenzaprine (FLEXERIL) 10 MG tablet Take 1 tablet (10 mg total) by mouth 2 (two) times daily as needed for muscle spasms. 30 tablet 1   diclofenac (VOLTAREN) 75 MG EC tablet TAKE 1 TABLET(75 MG) BY MOUTH TWICE DAILY AS  NEEDED 60 tablet 1   diclofenac Sodium (VOLTAREN) 1 % GEL Apply 4 g topically 4 (four) times daily. (Patient taking differently: Apply 4 g topically 4 (four) times daily as needed (pain).) 100 g 0   dicyclomine (BENTYL) 10 MG capsule Take 1 capsule (10 mg total) by mouth 3 (three) times daily before meals. 90 capsule 0   escitalopram (LEXAPRO) 10 MG tablet Take 1 tablet (10 mg total) by mouth daily. 30 tablet 2   febuxostat (ULORIC) 40 MG tablet Take 1 tablet (40 mg total) by mouth daily. 30 tablet 3   feeding supplement (ENSURE ENLIVE / ENSURE PLUS) LIQD Take 237 mLs by mouth 2 (two) times daily between meals.     Fluticasone-Umeclidin-Vilant (TRELEGY ELLIPTA) 100-62.5-25 MCG/INH AEPB Inhale 1 puff into the lungs daily. 60 each 3   furosemide (LASIX) 80 MG tablet Take 1 tablet (80 mg total) by mouth daily. 90 tablet 3   gabapentin (NEURONTIN) 600 MG tablet Take 1 tablet (600 mg total) by mouth 2 (two) times daily. 60 tablet 2   lidocaine (LIDODERM) 5 % Place 1 patch onto the skin daily. Remove & Discard patch within 12 hours or as directed by MD 30 patch 1   Misc. Devices [place] Power wheelchair.  Diagnosis-frequent falls, bilateral knee osteoarthritis 1 each 0   Misc. Devices MISC Bedside Commode DX: frequent fall 1 each 0   montelukast (SINGULAIR) 10 MG tablet TAKE 1 TABLET(10 MG) BY MOUTH AT BEDTIME 30 tablet 0   Multiple Vitamin (MULTIVITAMIN WITH MINERALS) TABS tablet Take 1 tablet by mouth daily.     omeprazole  (PRILOSEC) 40 MG capsule TAKE 1 CAPSULE(40 MG) BY MOUTH DAILY 30 capsule 0   oxybutynin (DITROPAN) 5 MG tablet TAKE 1 TABLET(5 MG) BY MOUTH TWICE DAILY 60 tablet 0   potassium chloride SA (KLOR-CON) 20 MEQ tablet Take 2 tablets (40 mEq total) by mouth 2 (two) times daily. 180 tablet 3   prazosin (MINIPRESS) 2 MG capsule Take 2 mg by mouth at bedtime.     prazosin (MINIPRESS) 5 MG capsule Take 5 mg by mouth 2 (two) times daily.     terbinafine (LAMISIL AT) 1 % cream Apply 1 application topically 2 (two) times daily. 30 g 1   thiamine 100 MG tablet Take 1 tablet (100 mg total) by mouth daily. (Patient not taking: Reported on 08/09/2020)     topiramate (TOPAMAX) 25 MG tablet Take 1 tablet (25 mg total) by mouth 2 (two) times daily. 60 tablet 0   VYVANSE 20 MG capsule Take 20 mg by mouth daily as needed (anxiety/ focusing).     Facility-Administered Medications Prior to Visit  Medication Dose Route Frequency Provider Last Rate Last Admin   betamethasone acetate-betamethasone sodium phosphate (CELESTONE) injection 6 mg  6 mg Other Once Magnus Sinning, MD         ROS Review of Systems  Constitutional:  Negative for activity change, appetite change and fatigue.  HENT:  Negative for congestion, sinus pressure and sore throat.   Eyes:  Negative for visual disturbance.  Respiratory:  Negative for cough, chest tightness, shortness of breath and wheezing.   Cardiovascular:  Positive for leg swelling. Negative for chest pain and palpitations.  Gastrointestinal:  Negative for abdominal distention, abdominal pain and constipation.  Endocrine: Negative for polydipsia.  Genitourinary:  Negative for dysuria and frequency.  Musculoskeletal:        See HPI  Skin:  Negative for rash.  Neurological:  Positive for numbness.  Negative for tremors and light-headedness.  Hematological:  Does not bruise/bleed easily.  Psychiatric/Behavioral:  Negative for agitation and behavioral problems.    Objective:  BP  (!) 144/97   Pulse 74   Resp 16   SpO2 100%   BP/Weight 10/07/2020 08/16/2020 6/33/3545  Systolic BP 625 638 937  Diastolic BP 97 89 92  Wt. (Lbs) - 121.69 -  BMI - 20.57 -  Some encounter information is confidential and restricted. Go to Review Flowsheets activity to see all data.      Physical Exam Constitutional:      Appearance: [name] well-developed.  Cardiovascular:     Rate and Rhythm: Normal rate.     Heart sounds: Normal heart sounds. No murmur heard. Pulmonary:     Effort: Pulmonary effort is normal.     Breath sounds: Normal breath sounds. No wheezing or rales.  Chest:     Chest wall: No tenderness.  Abdominal:     General: Bowel sounds are normal. There is no distension.     Palpations: Abdomen is soft. There is no mass.     Tenderness: There is no abdominal tenderness.  Musculoskeletal:        General: Normal range of motion.     Right lower leg: Edema (1+) present.     Left lower leg: Edema (1+) present.     Comments: Tenderness on range of motion of bilateral lower extremities Flexion of toes of left foot  Skin:    Comments: Ulcer on her right shin with clean dressing over it  Neurological:     Mental Status: [name] alert and oriented to person, place, and time.  Psychiatric:        Mood and Affect: Mood normal.    CMP Latest Ref Rng & Units 08/16/2020 08/15/2020 08/14/2020  Glucose 70 - 99 mg/dL 77 81 83  BUN 8 - 23 mg/dL 15 13 12   Creatinine 0.44 - 1.00 mg/dL 1.21(H) 1.28(H) 1.23(H)  Sodium 135 - 145 mmol/L 134(L) 132(L) 130(L)  Potassium 3.5 - 5.1 mmol/L 4.0 3.9 3.9  Chloride 98 - 111 mmol/L 107 103 99  CO2 22 - 32 mmol/L 21(L) 22 26  Calcium 8.9 - 10.3 mg/dL 8.1(L) 8.1(L) 8.4(L)  Total Protein 6.5 - 8.1 g/dL - - -  Total Bilirubin 0.3 - 1.2 mg/dL - - -  Alkaline Phos 38 - 126 U/L - - -  AST 15 - 41 U/L - - -  ALT 0 - 44 U/L - - -    Lipid Panel     Component Value Date/Time   CHOL 127 08/05/2018 1748   TRIG 53 08/05/2018 1748   HDL 62  08/05/2018 1748   CHOLHDL 2.0 08/05/2018 1748   CHOLHDL 3.7 01/07/2017 0005   VLDL 38 01/07/2017 0005   LDLCALC 54 08/05/2018 1748   LDLDIRECT 52 04/05/2012 1210    CBC    Component Value Date/Time   WBC 3.8 (L) 08/16/2020 1255   RBC 3.07 (L) 08/16/2020 1255   HGB 8.7 (L) 08/16/2020 1255   HGB 9.0 (L) 11/25/2019 1642   HCT 28.5 (L) 08/16/2020 1255   HCT 28.2 (L) 11/25/2019 1642   PLT 375 08/16/2020 1255   PLT 375 11/25/2019 1642   MCV 92.8 08/16/2020 1255   MCV 90 11/25/2019 1642   MCH 28.3 08/16/2020 1255   MCHC 30.5 08/16/2020 1255   RDW 15.6 (H) 08/16/2020 1255   RDW 15.4 11/25/2019 1642   LYMPHSABS 1.3 08/16/2020 1255  LYMPHSABS 2.0 11/25/2019 1642   MONOABS 0.3 08/16/2020 1255   EOSABS 0.0 08/16/2020 1255   EOSABS 0.2 11/25/2019 1642   BASOSABS 0.0 08/16/2020 1255   BASOSABS 0.0 11/25/2019 1642    Lab Results  Component Value Date   HGBA1C 5.3 07/23/2020    Assessment & Plan:  1. Diarrhea, unspecified type Stable - dicyclomine (BENTYL) 10 MG capsule; Take 1 capsule (10 mg total) by mouth 3 (three) times daily before meals.  Dispense: 90 capsule; Refill: 6  2. Neuropathy Uncontrolled Would love to place on Cymbalta however she is already on Lexapro I have sent a message to her psychiatrist to assess possibly switching from Lexapro to Cymbalta due to added analgesic benefit of Cymbalta - gabapentin (NEURONTIN) 600 MG tablet; Take 1 tablet (600 mg total) by mouth 2 (two) times daily.  Dispense: 60 tablet; Refill: 6  3. Gastroesophageal reflux disease without esophagitis Stable - omeprazole (PRILOSEC) 40 MG capsule; TAKE 1 CAPSULE(40 MG) BY MOUTH DAILY  Dispense: 30 capsule; Refill: 6  4. Tinea pedis of both feet Uncontrolled She continues to use topical antifungal Advised to wear open shoes at home to allow proper aeration of feet  5. Pain in both feet Chronic-uncontrolled Superimposed pedal edema Due to deformity of feet with recent currently I have  referred her to podiatry especially in the light of her pain Encouraged to comply with a low-sodium diet, elevate feet, use compression stockings - Ambulatory referral to Podiatry  6. Schizoaffective disorder, bipolar type ([place]) Stable with intermittent flares. Management as per psychiatry  7. Gout, unspecified cause, unspecified chronicity, unspecified site No recent flares - cyclobenzaprine (FLEXERIL) 10 MG tablet; Take 1 tablet (10 mg total) by mouth 2 (two) times daily as needed for muscle spasms.  Dispense: 30 tablet; Refill: 6 - febuxostat (ULORIC) 40 MG tablet; Take 1 tablet (40 mg total) by mouth daily.  Dispense: 30 tablet; Refill: 6 - colchicine 0.6 MG tablet; Take 2 tabs (1.2 mg) orally at the onset of gout flare, may repeat 1 tab (0.6 mg) 1 hour if symptoms persist  Dispense: 30 tablet; Refill: 6  8. Essential hypertension Slightly above goal No regimen change Will look into switching from Norvasc to another antihypertensive especially in the light of her pedal edema Counseled on blood pressure goal of less than 130/80, low-sodium, DASH diet, medication compliance, 150 minutes of moderate intensity exercise per week. Discussed medication compliance, adverse effects. - amLODipine (NORVASC) 5 MG tablet; Take 1 tablet (5 mg total) by mouth daily.  Dispense: 30 tablet; Refill: 6  9. Anemia, unspecified type Last hemoglobin was 8.7 She has never had a colonoscopy and I have offered to refer her for colon cancer screening but she declines She would like ferrous sulfate instead   No orders of the defined types were placed in this encounter.   Follow-up: Return to clinic if symptoms persist or worsen    Spent 48 minutes including median intraservice time, reviewing patient's chart, reviewing her medications and indications with the patient and mother, discussing diagnosis, management , answering patient's questions and she was satisfied at the end of the visit.  [name], MD, FAAFP. [facility] [place], [place]   10/07/2020, 11:02 AM

## 2020-10-07 NOTE — Progress Notes (Signed)
Pt states she has pain all over her body  

## 2020-10-08 ENCOUNTER — Encounter: Payer: Self-pay | Admitting: Family Medicine

## 2020-10-08 MED ORDER — IRON (FERROUS SULFATE) 325 (65 FE) MG PO TABS: 325.0000 mg | ORAL_TABLET | Freq: Every day | ORAL | 2 refills | Status: DC

## 2020-10-13 ENCOUNTER — Telehealth: Payer: Self-pay | Admitting: Family Medicine

## 2020-10-13 NOTE — Telephone Encounter (Signed)
Copied from [place] [phone]. Topic: Appointment Scheduling - Scheduling Inquiry for Clinic >> Oct 11, 2020  5:11 PM Erick Blinks wrote: Reason for CRM: Pt called requesting to schedule an appt with the pharmacist to discuss her medications. Please advise  Best contact: [place],  This patient is requesting an appointment with you. Does she need one if so let me know and I will schedule it

## 2020-10-14 ENCOUNTER — Encounter: Payer: Self-pay | Admitting: Podiatry

## 2020-10-14 ENCOUNTER — Ambulatory Visit (INDEPENDENT_AMBULATORY_CARE_PROVIDER_SITE_OTHER): Payer: Medicare HMO | Admitting: Psychiatry

## 2020-10-14 ENCOUNTER — Encounter (HOSPITAL_COMMUNITY): Payer: Self-pay | Admitting: Psychiatry

## 2020-10-14 ENCOUNTER — Other Ambulatory Visit: Payer: Self-pay

## 2020-10-14 ENCOUNTER — Ambulatory Visit (INDEPENDENT_AMBULATORY_CARE_PROVIDER_SITE_OTHER): Payer: Medicare HMO | Admitting: Podiatry

## 2020-10-14 ENCOUNTER — Ambulatory Visit (INDEPENDENT_AMBULATORY_CARE_PROVIDER_SITE_OTHER): Payer: Medicare HMO

## 2020-10-14 VITALS — BP 137/70 | HR 91 | Temp 97.9°F

## 2020-10-14 DIAGNOSIS — M2041 Other hammer toe(s) (acquired), right foot: Secondary | ICD-10-CM | POA: Diagnosis not present

## 2020-10-14 DIAGNOSIS — M2042 Other hammer toe(s) (acquired), left foot: Secondary | ICD-10-CM | POA: Diagnosis not present

## 2020-10-14 DIAGNOSIS — E1142 Type 2 diabetes mellitus with diabetic polyneuropathy: Secondary | ICD-10-CM | POA: Diagnosis not present

## 2020-10-14 DIAGNOSIS — M79672 Pain in left foot: Secondary | ICD-10-CM

## 2020-10-14 DIAGNOSIS — M21619 Bunion of unspecified foot: Secondary | ICD-10-CM

## 2020-10-14 DIAGNOSIS — F250 Schizoaffective disorder, bipolar type: Secondary | ICD-10-CM | POA: Diagnosis not present

## 2020-10-14 DIAGNOSIS — M79671 Pain in right foot: Secondary | ICD-10-CM

## 2020-10-14 NOTE — Telephone Encounter (Signed)
I'm happy to see her but I would encourage any immediate questions to be directed to her community pharmacist at [facility].

## 2020-10-14 NOTE — Progress Notes (Signed)
Virtual Visit via Video Note  I connected with Tiffany [name] on 10/14/20 at  1:00 PM EDT by a video enabled telemedicine application and verified that I am speaking with the correct person using two identifiers.  Location: Patient: Patient Home Provider: Home Office   I discussed the limitations of evaluation and management by telemedicine and the availability of in person appointments. The patient expressed understanding and agreed to proceed.   History of Present Illness: Schizoeffective DO Bipolar Type and Chronic PTSD   Treatment Plan Goals: Goal 1: Client would like to create and maintain a more stable living environment in order to better manage mental health care and routine with medication management and therapy participation to reduce anxiety and depression. Goal 2: Client would like to process grief/loss issues and trauma experiences to heal from past to move forward in building healthy relationships with others.   Observations/Objective: Counselor met with Client for individual therapy via telephone. Counselor assessed MH symptoms and progress on treatment plan goals, with Client stating that she had a lot going on in her home today-referencing her mother and aid being present. Client very distractible and difficult to redirect. Client fixated on activity in the home. Client communicated in a calm and stable manner, despite distractions. Client stated she has not been taking medications as prescribed, as she has not seen doctor to get them refilled or adjusted. Client presents with mild depression and moderate anxiety. Client denied suicidal ideation or self-harm behaviors. Client experiencing intermittent paranoia and complex health issues, that she is receiving treatment.    Counselor shared that today would be our last session, as Counselor with be ending time with [facility] effective October 1. Client was understanding and we discussed an appropriate transfer of care. Client to  establish care with an alternative practice for counseling and psychiatry. Client was given a list of options and she states she will continue to seek care. Client aware of how to follow up and follow safety plan if needed.   Assessment and Plan: New Counselor will continue to meet with patient to address and reestablish treatment plan goals. Patient will continue to follow recommendations of providers and implement skills learned in session.   Follow Up Instructions: New Counselor will send information for next session via Webex.    The patient was advised to call back or seek an in-person evaluation if the symptoms worsen or if the condition fails to improve as anticipated.   I provided 30 minutes of non-face-to-face time during this encounter.     Lise Auer, LCSW

## 2020-10-14 NOTE — Progress Notes (Signed)
Subjective:  Patient ID: Tiffany [name], female    DOB: 1960-05-29,   MRN: [id number]  Chief Complaint  Patient presents with   Foot Pain     60 y.o. female presents for bilateral foot pain that has been present for several one year. States the pain is throbbing burning and itchy in nature. Also has a history of neuropathy for which she takes gabapentin. Relates swelling in both of her legs as well . States she has pain all over her feet and up her legs.  Denies any other pedal complaints. Denies n/v/f/c.   Past Medical History:  Diagnosis Date   Agitation 11/22/2017   Anoxic brain injury ([place]) 09/08/2016   C. Arrest due to respiratory failure and COPD exacerbation   Anxiety    Arthritis    "all over" (04/10/2016)   Asthma 10/18/2010   Binge eating disorder    Cardiac arrest ([place]) 09/08/2016   PEA   Carotid artery stenosis    1-39% bilateral by dopplers 11/2016   Chronic diastolic (congestive) heart failure ([place])    Chronic kidney disease    "I see a kidney dr." (04/10/2016)   Chronic pain syndrome 06/18/2012   Chronic post-traumatic stress disorder (PTSD) 05/27/2018   Chronic respiratory failure with hypoxia and hypercapnia ([place]) 06/22/2015   TRILOGY Vent >AVAPA-ES., Vt target 200-400, Max P 30 , PS max 20 , PS min 6-10 , E Max 6, E Min 4, Rate Auto AVAPS Rate 2 (titrate for pt comfort) , bleed O2 at 5l/m continuous flow .    CKD (chronic kidney disease) stage 3, GFR 30-59 ml/min (HCC) 12/15/2016   Closed displaced fracture of fifth metacarpal bone 03/21/2018   Cocaine use disorder, severe, in sustained remission ([place]) 07/21/2261   Complication of anesthesia    decreased bp, decreased heart rate   COPD (chronic obstructive pulmonary disease) ([place]) 07/08/2014   Depression    Diabetic neuropathy ([place]) 04/24/2011   Difficulty with speech 01/24/2018   Disorder of nervous system    Drug abuse ([place]) 11/21/2017   Dyslipidemia 04/24/2011   Elevated troponin 04/28/2012   Emphysema    Encephalopathy  11/21/2017   Essential hypertension 03/22/2016   Fibula fracture 07/10/2016   Frequent falls 10/11/2017   GERD (gastroesophageal reflux disease)    Gout 04/11/2017   Heart attack ([place]) 1980s   History of blood transfusion 1994   "couldn't stop bleeding from my period"   History of drug abuse in remission ([place]) 11/28/2015   Quit in 2017   Hyperlipidemia LDL goal <70    Incontinence    Manic depression ([place])    Morbid obesity ([place]) 10/18/2010   Obstructive sleep apnea 10/18/2010   On home oxygen therapy    "6L; 24/7" (04/10/2016)   OSA on CPAP    "wear mask sometimes" (04/10/2016)   Paranoid ([place])    "sometimes; I'm on RX for it" (04/10/2016)   Prolonged Q-T interval on ECG    Rectal bleeding 12/31/2015   Right carotid bruit 11/09/2016   Schizoaffective disorder, bipolar type ([place]) 04/05/2018   Seasonal allergies    Seborrheic keratoses 12/31/2013   Seizures ([place])    "don't know what kind; last one was ~ 1 yr ago" (04/10/2016)   Stroke [facility]) 1980s   denies residual on 04/10/2016   Thrush 09/19/2013   Type 2 diabetes mellitus ([place]) 10/18/2010    Objective:  Physical Exam: Vascular: DP/PT pulses 2/4 bilateral. CFT <3 seconds. Normal hair growth on digits. Bilateral lower extremity  edema  Skin. No lacerations or abrasions bilateral feet.  Musculoskeletal: MMT 5/5 bilateral lower extremities in DF, PF, Inversion and Eversion. Pain with DF, PF of digits.  Deceased ROM in DF of ankle joint. Tenderness to palpation circumferentially around her feet. Bilateral bunions and hammertoes noted.  Neurological: Sensation intact to light touch.   Assessment:   1. Hammertoe, bilateral   2. Bunion   3. Diabetic polyneuropathy associated with type 2 diabetes mellitus ([place])      Plan:  Patient was evaluated and treated and all questions answered. X-rays reviewed and discussed with patient. No acute fractures or dislocations. Mild osteopenic changes noted throughout bilateral feet. Mild spurring noted  to plantar calcaneus bilateral with pes planus foot type noted bilateral. Noted hammered digits 2-5 bilateral and increased IM 1-2 angle bilateral.  -Discussed HAV, hammertoe and neuropathy treatment options;conservative and surgical management; risks, benefits, alternatives discussed. All patient's questions answered. -Discussed padding and wide shoe gear.   -Recommend continue with good supportive shoes and inserts.  -Prescription for DM shoes provided.  -Discussed compression stockings for swelling and prescription provided.   -Patient to return to office as needed or sooner if condition worsens.    Lorenda Peck, DPM

## 2020-10-15 ENCOUNTER — Encounter (HOSPITAL_COMMUNITY): Payer: Self-pay

## 2020-10-15 ENCOUNTER — Ambulatory Visit: Payer: Self-pay | Admitting: *Deleted

## 2020-10-15 ENCOUNTER — Other Ambulatory Visit: Payer: Self-pay

## 2020-10-15 ENCOUNTER — Emergency Department (HOSPITAL_COMMUNITY)
Admission: EM | Admit: 2020-10-15 | Discharge: 2020-10-17 | Disposition: A | Discharge status: Home / Self Care | Payer: Medicare HMO | Source: Home / Self Care | Attending: Emergency Medicine | Admitting: Emergency Medicine

## 2020-10-15 ENCOUNTER — Emergency Department (HOSPITAL_COMMUNITY)
Admission: EM | Admit: 2020-10-15 | Discharge: 2020-10-15 | Disposition: A | Discharge status: Home / Self Care | Payer: Medicare HMO | Attending: Emergency Medicine | Admitting: Emergency Medicine

## 2020-10-15 DIAGNOSIS — J45909 Unspecified asthma, uncomplicated: Secondary | ICD-10-CM | POA: Insufficient documentation

## 2020-10-15 DIAGNOSIS — E119 Type 2 diabetes mellitus without complications: Secondary | ICD-10-CM | POA: Insufficient documentation

## 2020-10-15 DIAGNOSIS — Z87891 Personal history of nicotine dependence: Secondary | ICD-10-CM | POA: Insufficient documentation

## 2020-10-15 DIAGNOSIS — N183 Chronic kidney disease, stage 3 unspecified: Secondary | ICD-10-CM | POA: Insufficient documentation

## 2020-10-15 DIAGNOSIS — I5032 Chronic diastolic (congestive) heart failure: Secondary | ICD-10-CM | POA: Insufficient documentation

## 2020-10-15 DIAGNOSIS — J449 Chronic obstructive pulmonary disease, unspecified: Secondary | ICD-10-CM | POA: Insufficient documentation

## 2020-10-15 DIAGNOSIS — G629 Polyneuropathy, unspecified: Secondary | ICD-10-CM

## 2020-10-15 DIAGNOSIS — M79605 Pain in left leg: Secondary | ICD-10-CM | POA: Diagnosis not present

## 2020-10-15 DIAGNOSIS — Z20822 Contact with and (suspected) exposure to covid-19: Secondary | ICD-10-CM | POA: Insufficient documentation

## 2020-10-15 DIAGNOSIS — E1140 Type 2 diabetes mellitus with diabetic neuropathy, unspecified: Secondary | ICD-10-CM | POA: Insufficient documentation

## 2020-10-15 DIAGNOSIS — M109 Gout, unspecified: Secondary | ICD-10-CM

## 2020-10-15 DIAGNOSIS — R600 Localized edema: Secondary | ICD-10-CM | POA: Insufficient documentation

## 2020-10-15 DIAGNOSIS — F329 Major depressive disorder, single episode, unspecified: Secondary | ICD-10-CM | POA: Insufficient documentation

## 2020-10-15 DIAGNOSIS — G8929 Other chronic pain: Secondary | ICD-10-CM | POA: Diagnosis not present

## 2020-10-15 DIAGNOSIS — E1122 Type 2 diabetes mellitus with diabetic chronic kidney disease: Secondary | ICD-10-CM | POA: Diagnosis not present

## 2020-10-15 DIAGNOSIS — M79604 Pain in right leg: Secondary | ICD-10-CM | POA: Diagnosis not present

## 2020-10-15 DIAGNOSIS — I130 Hypertensive heart and chronic kidney disease with heart failure and stage 1 through stage 4 chronic kidney disease, or unspecified chronic kidney disease: Secondary | ICD-10-CM | POA: Insufficient documentation

## 2020-10-15 DIAGNOSIS — F419 Anxiety disorder, unspecified: Secondary | ICD-10-CM

## 2020-10-15 DIAGNOSIS — F250 Schizoaffective disorder, bipolar type: Secondary | ICD-10-CM | POA: Insufficient documentation

## 2020-10-15 DIAGNOSIS — Z79899 Other long term (current) drug therapy: Secondary | ICD-10-CM | POA: Insufficient documentation

## 2020-10-15 DIAGNOSIS — Z760 Encounter for issue of repeat prescription: Secondary | ICD-10-CM | POA: Diagnosis present

## 2020-10-15 LAB — ETHANOL: Alcohol, Ethyl (B): <10 mg/dL (ref <10)

## 2020-10-15 MED ORDER — DICLOFENAC SODIUM 75 MG PO TBEC
75.0000 mg | DELAYED_RELEASE_TABLET | Freq: Two times a day (BID) | ORAL | 0 refills | Status: DC | PRN
Start: 2020-10-15 — End: 2020-11-15

## 2020-10-15 MED ORDER — POTASSIUM CHLORIDE CRYS ER 20 MEQ PO TBCR
40.0000 meq | EXTENDED_RELEASE_TABLET | Freq: Two times a day (BID) | ORAL | Status: DC
  Administered 2020-10-15 – 2020-10-17 (×4): 40 meq via ORAL
  Filled 2020-10-15 (×4): qty 2

## 2020-10-15 MED ORDER — ALBUTEROL SULFATE HFA 108 (90 BASE) MCG/ACT IN AERS: 2.0000 | INHALATION_SPRAY | Freq: Four times a day (QID) | RESPIRATORY_TRACT | Status: DC | PRN

## 2020-10-15 MED ORDER — GABAPENTIN 600 MG PO TABS: 600.0000 mg | ORAL_TABLET | Freq: Two times a day (BID) | ORAL | 6 refills | Status: DC

## 2020-10-15 MED ORDER — DICYCLOMINE HCL 10 MG PO CAPS
10.0000 mg | ORAL_CAPSULE | Freq: Three times a day (TID) | ORAL | Status: DC
  Administered 2020-10-16 – 2020-10-17 (×2): 10 mg via ORAL
  Filled 2020-10-15 (×7): qty 1

## 2020-10-15 MED ORDER — FUROSEMIDE 40 MG PO TABS
80.0000 mg | ORAL_TABLET | Freq: Every day | ORAL | Status: DC
  Administered 2020-10-16: 80 mg via ORAL
  Filled 2020-10-15 (×3): qty 2

## 2020-10-15 MED ORDER — DICLOFENAC SODIUM 1 % EX GEL: 4.0000 g | Freq: Four times a day (QID) | CUTANEOUS | 0 refills | Status: DC

## 2020-10-15 MED ORDER — GABAPENTIN 300 MG PO CAPS
600.0000 mg | ORAL_CAPSULE | Freq: Two times a day (BID) | ORAL | Status: DC
  Administered 2020-10-15 – 2020-10-17 (×4): 600 mg via ORAL
  Filled 2020-10-15 (×4): qty 2

## 2020-10-15 MED ORDER — FENTANYL CITRATE PF 50 MCG/ML IJ SOSY
50.0000 ug | PREFILLED_SYRINGE | Freq: Once | INTRAMUSCULAR | Status: AC
  Administered 2020-10-15: 50 ug via INTRAMUSCULAR
  Filled 2020-10-15: qty 1

## 2020-10-15 MED ORDER — FEBUXOSTAT 40 MG PO TABS: 40.0000 mg | ORAL_TABLET | Freq: Every day | ORAL | 6 refills | Status: DC

## 2020-10-15 MED ORDER — AMLODIPINE BESYLATE 5 MG PO TABS
5.0000 mg | ORAL_TABLET | Freq: Every day | ORAL | Status: DC
  Administered 2020-10-15 – 2020-10-16 (×2): 5 mg via ORAL
  Filled 2020-10-15 (×3): qty 1

## 2020-10-15 MED ORDER — FERROUS SULFATE 325 (65 FE) MG PO TABS
325.0000 mg | ORAL_TABLET | Freq: Every day | ORAL | Status: DC
  Administered 2020-10-15 – 2020-10-17 (×3): 325 mg via ORAL
  Filled 2020-10-15 (×3): qty 1

## 2020-10-15 MED ORDER — COLCHICINE 0.6 MG PO TABS
ORAL_TABLET | ORAL | 6 refills | Status: DC
Start: 2020-10-15 — End: 2020-12-01
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
## 2020-10-15 NOTE — Discharge Instructions (Addendum)
Follow-up with use the resources given or your primary care doctor.  Mental Health Resources:   [facility]  Provider: Parke Poisson, MD  Address: [address] [place], [place], [place] [address] Phone: [phone]    [facility]   Address: [address] [place], [place], [place] [address] Phone: [phone]   [facility]  Address: [address] [place], [place] [address] Phone#: [phone]  [facility]-will provide timely access to mental health services for children and adolescents (4-17) and adults presenting in a mental health crisis. The program is designed for those who need urgent Behavioral Health or Substance Use treatment and are not experiencing a medical crisis that would typically require an emergency room visit.    [place],  [address] Phone: [phone] [email or web address]   [facility] will also offer the following outpatient services: (Monday through Friday 8am-5pm)   Partial Hospitalization Program (PHP) Substance Abuse Intensive Outpatient Program (SA-IOP) Group Therapy Medication Management Peer Living Room   We also provide (24/7):    Assessments: Our mental health clinician and providers will conduct a focused mental health evaluation, assessing for immediate safety concerns and further mental health needs.   Referral: Our team will provide resources and help connect to community based mental health treatment, when indicated, including psychotherapy, psychiatry, and other specialized behavioral health or substance use disorder services (for those not already in treatment).   Transitional Care: Our team providers in person bridging and/or telphonic follow-up during the patient's transition to outpatient services.

## 2020-10-15 NOTE — ED Notes (Signed)
Pt cleaned, blue scrubs given. Pt is trying to arrange ride that will help her get into house.

## 2020-10-15 NOTE — ED Notes (Signed)
Pt assisted to the bathroom with NT and changed and toileted at this time. Pt agitated about the wait for a psychiatrist consult. Provided pt explanation in detail regarding the routine of how a psych consultation works in the ED after hours. Pt was not satisfied with explanation and asks for "an ambulance to pick her and take her to another hospital".
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
## 2020-10-15 NOTE — ED Notes (Signed)
Pt agrees to stay and be evaluated by TTS

## 2020-10-15 NOTE — ED Notes (Signed)
This RN attempted to take pt to bathroom and change her because pt states "I am wet". Upon attempting to wheel pt to bathroom, pt states "I cant leave my stuff over here". This RN told pt that we cannot carry 3 bags of stuff to the bathroom. Pt states "I dont give a fuck I'm not leaving my stuff here bitch. Your ass aint doing shit"
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
## 2020-10-15 NOTE — Discharge Instructions (Addendum)
[facility]ed [facility] and taking medications as directed

## 2020-10-15 NOTE — ED Notes (Signed)
Pt states that she wants to leave now. Discussed with MD and he will discharge pt
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
## 2020-10-16 DIAGNOSIS — Z760 Encounter for issue of repeat prescription: Secondary | ICD-10-CM | POA: Diagnosis not present

## 2020-10-16 LAB — RESP PANEL BY RT-PCR (FLU A&B, COVID) ARPGX2
Influenza A by PCR: NEGATIVE
Influenza B by PCR: NEGATIVE
SARS Coronavirus 2 by RT PCR: NEGATIVE

## 2020-10-16 MED ORDER — PRAZOSIN HCL 1 MG PO CAPS
5.0000 mg | ORAL_CAPSULE | Freq: Two times a day (BID) | ORAL | Status: DC
  Administered 2020-10-16: 5 mg via ORAL
  Filled 2020-10-16 (×2): qty 5

## 2020-10-16 MED ORDER — PRAZOSIN HCL 1 MG PO CAPS
2.0000 mg | ORAL_CAPSULE | Freq: Every day | ORAL | Status: DC
  Administered 2020-10-16: 2 mg via ORAL
  Filled 2020-10-16: qty 2

## 2020-10-16 MED ORDER — ASENAPINE MALEATE 5 MG SL SUBL
10.0000 mg | SUBLINGUAL_TABLET | Freq: Two times a day (BID) | SUBLINGUAL | Status: DC
  Administered 2020-10-16 – 2020-10-17 (×3): 10 mg via SUBLINGUAL
  Filled 2020-10-16 (×3): qty 2

## 2020-10-16 NOTE — ED Notes (Signed)
Pt alert, calm.  Eating dinner.

## 2020-10-16 NOTE — Progress Notes (Signed)
Per Olena Leatherwood, patient meets criteria for inpatient treatment. There are no available or appropriate beds at [facility] today. [name] faxed referrals to the following facilities for review:  [facility]  Pending - No Request Sent N/A [address] [place] [address] [phone] --  [facility]  Pending - No Request Sent N/A [address] [place] [address] [phone] --  [place]  Pending - No Request Sent N/A [address], [place] [address] [phone] --  [facility]  Pending - No Request [address] [facility] [address] [place] [address] [phone] --  [address] [place] [address] [phone] --  [place]  Pending - No Request Sent N/A [address] [place] [address] [phone] --  [facility]  Pending - No Request Sent N/A [address] [place], [place] [address] [phone] --  [facility]  Pending - No Request Sent N/A [name] [place] [address] [place] --  [facility]  Pending - No Request Sent N/A [address] [place] [address] [facility]  Pending - No Request Sent N/A [address] [place] [address] [phone] --  [facility]  Pending - No Request Sent N/A [address] [place] [address] [phone] --  [place]  Pending - No Request Sent N/A [address], [place] [address] [phone] --  [facility]  Pending - No Request Sent N/A [place], [place] [address] [place] --   [place]  Pending - No Request Sent N/A [address] [place] Mon[place] [address] [phone] --  [facility]  Pending - No Request Sent N/A [address] [place] [address] [phone] --  [facility]  Pending - No Request Sent N/A [address] [place] [address] [phone] --  [facility]  Pending - No Request Sent N/A [address], [place] [address] [phone] --  [facility]  Pending - No Request Sent N/A [address] [place] [address] [phone] --  [place]  Pending - No Request Sent N/A [place], [place] [address] [phone] --  [place] No Request Sent N/A [address] [place] [address] [phone] --  [facility]  Pending - No Request Sent N/A [address] [place] [address] [phone] --   TTS will continue to seek bed placement.  Glennie Isle, MSW, [place], LCAS-A Phone: [phone] Disposition/TOC

## 2020-10-16 NOTE — ED Notes (Signed)
Pt resting quietly in room, took all of her evening med's with out incident, calm and cooperative.
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
## 2020-10-16 NOTE — ED Notes (Signed)
Pt to room 27. Pt drowsy. Cooperative. Pt unsteady. Needed help to the bed.  Pt resting comfortably at this time.

## 2020-10-16 NOTE — ED Notes (Signed)
Pt resting comfortably

## 2020-10-16 NOTE — ED Notes (Signed)
Pt belongings in cabinet for [employer]

## 2020-10-16 NOTE — ED Notes (Signed)
Breakfast provided.

## 2020-10-17 DIAGNOSIS — Z760 Encounter for issue of repeat prescription: Secondary | ICD-10-CM | POA: Diagnosis not present

## 2020-10-17 NOTE — Progress Notes (Signed)
[name] provided the following resources at the provider's request to assist with the patient's need. It should be noted per documentation from treatment goals on 9/29; "the patient would like to create and maintain a more stable living environment in order to better manage mental health care and routine with medication management and therapy participation to reduce anxiety and depression; and client would like to process/loss issues and trauma experiences to heal from past to move forward in building health relationships with others." Further, it was documented on 10/16/2020 that the patient's current provider for medication management is Dr. Jacquiline Doe Cobos with [facility].   Mental Health Resources:   [place]  Provider: Parke Poisson, MD  Address: [address] [place], [place], [place] [address] Phone: [phone]    [place]   Address: [address] [place], [place], [place] [address] Phone: [phone]   [facility]  Address: [address] [place], [place] [address] Phone#: [phone]  [facility]-will provide timely access to mental health services for children and adolescents (4-17) and adults presenting in a mental health crisis. The program is designed for those who need urgent Behavioral Health or Substance Use treatment and are not experiencing a medical crisis that would typically require an emergency room visit.    [place], [place] [address] Phone: [phone] [email or web address]   [facility] will also offer the following outpatient services: (Monday through Friday 8am-5pm)   Partial Hospitalization Program (PHP) Substance Abuse Intensive Outpatient Program (SA-IOP) Group Therapy Medication Management Peer Living Room   We also provide (24/7):    Assessments: Our mental health clinician and providers will  conduct a focused mental health evaluation, assessing for immediate safety concerns and further mental health needs.   Referral: Our team will provide resources and help connect to community based mental health treatment, when indicated, including psychotherapy, psychiatry, and other specialized behavioral health or substance use disorder services (for those not already in treatment).   Transitional Care: Our team providers in person bridging and/or telphonic follow-up during the patient's transition to outpatient services.     Glennie Isle, MSW, [place], LCAS-A Phone: [phone] Disposition/TOC

## 2020-10-17 NOTE — Progress Notes (Signed)
Per Olena Leatherwood, patient meets criteria for inpatient treatment. There are no available or appropriate beds at [facility] today. [name] re-faxed referrals to the following facilities for review:   [facility]  Pending - No Request Sent N/A [address] [place] [address] [phone] --  [facility]  Pending - No Request Sent N/A [address] [place] [address] [phone] --  [place]  Pending - No Request Sent N/A [address] [place] [address] [phone] --  [facility]  Pending - No Request [facility] [address] [place] [address] [phone] --  [place] [address] [place] [address] [phone] --  [place]  Pending - No Request Sent N/A [address] [place] [address] [phone] --  [facility]  Pending - No Request Sent N/A [address] [place], [place] [address] [phone] --  [facility]  Pending - No Request Sent N/A [address] [place] [address] [place] --  [facility]  Pending - No Request Sent N/A [address], [place] [address] [facility]  Pending - No Request Sent N/A [address] [place] [address] [phone] --  [facility]  Pending - No Request Sent N/A [address] [place] [address] [phone] --  [employer]  Pending - No Request Sent N/A [address] [place] [address] [phone] --  [facility]  Pending - No Request Sent N/A [place], [place] [address] [place] --   [place]  Pending - No Request Sent N/A [address] [place] [address] [phone] --  [facility]  Pending - No Request Sent N/A [address] [place] [address] [phone] --  [facility]  Pending - No Request Sent N/A [address] [place] [address] [phone] --  [facility]  Pending - No Request Sent N/A [address] [place] [address] [phone] --  [facility]  Pending - No Request Sent N/A [address] [place] [address] [phone] --  [place]  Pending - No Request Sent N/A [address], [place] [address] [phone] --  [place] No Request Sent N/A [place] [address] [phone] --  [facility]  Pending - No Request Sent N/A [address] [place] [address] [phone] --    TTS will continue to seek bed placement.   [place]ennie [place], MSW, [place], LCAS-A Phone: [phone] Disposition/TOC

## 2020-10-17 NOTE — ED Provider Notes (Signed)
  Physical Exam  BP 94/67 (BP Location: Left Arm)   Pulse 93   Temp 98.3 F (36.8 C) (Oral)   Resp 18   Ht 5' 3.5" (1.613 m)   Wt 45.4 kg   SpO2 100%   BMI 17.44 kg/m   Physical Exam  ED Course/Procedures     Procedures  MDM  Received care of previous providers. Please see their notes for prior history and care.  Pt has been psychiatrically cleared. She lives alone and ambulates with walker, has pain related to bunions and hammer toes.  Able to ambulate in the ED using her walker. Reports she would not want assisted living or other placement and would like to take a taxi home.            Gareth Morgan, MD 10/17/20 2333

## 2020-10-17 NOTE — Progress Notes (Addendum)
[name] received consult for patient to assist with transportation home.  [name] attempted to reach patient's daughter Marni Griffon at [phone] without success - this number does not belong to [facility].  [name] spoke with patient's mother Lucita Ferrara at [phone] who was agreeable to come pick the patient up but the patient was not agreeable to being discharged to her mother.  [name] spoke with [facility] states they are not available to transport patient's home today.  [name] spoke with Eustaquio Maize, RN regarding using a cab voucher for transportation - RN will call [employer] for transport.  Madilyn Fireman, MSW, LCSW Transitions of Care  Clinical Social Worker II [phone]

## 2020-10-17 NOTE — ED Notes (Signed)
Pt DC off unit to home per provider. Pt alert,  calm, cooperative. Pt unsteady with walker. Pt states can use walker to get around.  MD assessed. DC information given to pt with acknowledged understanding. Belongings given to pt.  Pt off unit in W/C.   Pt escorted by NT.   Pt transported by [employer]
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
## 2020-10-17 NOTE — Consult Note (Signed)
Attempted to see patient via telepsychiatry with assistance of Wilmon Arms, Therapist, sports.  Patient asleep, will re-attempt later.

## 2020-10-18 ENCOUNTER — Telehealth (HOSPITAL_BASED_OUTPATIENT_CLINIC_OR_DEPARTMENT_OTHER): Payer: 59 | Admitting: Psychiatry

## 2020-10-18 ENCOUNTER — Telehealth: Payer: Self-pay | Admitting: Family Medicine

## 2020-10-18 ENCOUNTER — Other Ambulatory Visit (HOSPITAL_COMMUNITY): Payer: Self-pay

## 2020-10-18 ENCOUNTER — Other Ambulatory Visit: Payer: Self-pay

## 2020-10-18 ENCOUNTER — Encounter (HOSPITAL_COMMUNITY): Payer: Self-pay | Admitting: Psychiatry

## 2020-10-18 DIAGNOSIS — F250 Schizoaffective disorder, bipolar type: Secondary | ICD-10-CM | POA: Diagnosis not present

## 2020-10-18 MED ORDER — DIAZEPAM 5 MG PO TABS: 5.0000 mg | ORAL_TABLET | Freq: Three times a day (TID) | ORAL | 1 refills | Status: DC | PRN

## 2020-10-18 MED ORDER — ESCITALOPRAM OXALATE 10 MG PO TABS: 10.0000 mg | ORAL_TABLET | Freq: Every day | ORAL | 2 refills | Status: DC

## 2020-10-18 MED ORDER — TRAZODONE HCL 50 MG PO TABS: 50.0000 mg | ORAL_TABLET | Freq: Every day | ORAL | 0 refills | Status: DC

## 2020-10-18 MED ORDER — ASENAPINE MALEATE 5 MG SL SUBL: 10.0000 mg | SUBLINGUAL_TABLET | Freq: Two times a day (BID) | SUBLINGUAL | 1 refills | Status: DC

## 2020-10-18 NOTE — Telephone Encounter (Signed)
Pt called back requested to speak with Dr Margarita Rana, please advise.
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
## 2020-10-18 NOTE — Progress Notes (Addendum)
[name] MD/PA/NP OP Progress Note     Tiffany [name]  MRN:  [id number]  10/18/2020    Interview was conducted by phone and I verified that I was speaking with the correct person using two identifiers. Limitations associated with this type of communication reviewed. Participants in the visit: patient (location - home); physician (location - clinic office). Duration 30 minutes   Chief Complaint:  Medication management follow up  HPI: 60 yo divorced AAF with schizoaffective disorder bipolar type (vs paranoid  schizophrenia), chronic PTSD and binge eating disorder.  She has medical history of COPD, Respiratory Failure in the past, leading to cariac arrest in 2018, Carotid Artery Stenosis, CHF, CKD , and reported remote history of seizures , CVA.  She recently  ( 9/30 ) went to ED reporting foot pain , depression, anxiety. She was cleared by psychiatry and was discharged   Tiffany [name] reports she has continued to struggle with anxiety. She reports chronic family stressors, mainly relating to her adult son and elderly mother.   During session patient was intermittently irritable, repeatedly stating that writer was not a good psychiatrist as her previous ( Dr Montel Culver) had been and that she was not on the medications she needed to be on at this time. Gradually became less irritable and affect appeared to improve, was noted to laugh at times appropriately during session.  She reports she went to ED earlier this week due to anxiety and because of foot pain, " hammerhead toes". Reports her mood as " not good " which she attributes in part to medical illnesses and to " not being on the medications that help". Denies suicidal ideations . Does not endorse hallucinations and does not express delusional ideations. Her thought process presents linear Today she reports being unhappy and feeling frustrated  about her psychiatric medication regimen.  She states she feels that Dr Montel Culver had her on " good  medications " that she states were " taken away". States " those medications helped me a lot , Doctor P was a good Tax adviser and knew me well ".  As noted , she has missed several recent appointments and time I saw her for med management was on 5/16, since then has missed her appointments on 6/1 , 7/25 , 9/19   We reviewed history/ medications as below -  She has been diagnosed with Schizoaffective Disorder and PTSD in the past.  She had been seeing Dr Montel Culver for outpatient psychiatric management until earlier this year when he retired . At the time she was being prescribed Asenapine 10 mgr BID, Minipress 2 mgr QHS, Neurontin 600 mgr BID, Lexapro 20 mgr QDAY, Vyvanse 20 mgr BID, Topamax 25 mgr BID, Valium 10 mgr TID. She reports this regimen was helpful, except for Topamax, which she states was not well tolerated and which she was not taking .   She reports that at this time she is taking  Saphris ,  Neurontin ( apparently prescribed by another provider) .[name] [name]  apparently not currently taking Lexapro, Valium , Minipress or Vyvanse .   We  reviewed /discussed missing medication management appointments and the importance of keeing appointments for optimal outpatient management  I reviewed medications as above- denies history of side effects. Reviewed rationale to preferably avoid stimulant medications ( Vyvanse ) based on her medical history and risk of cardiovascular side effects associated with stimulant medications .  Labs reviewed- most recent EKG in chart is from 7/25- QTc 460.  -Recent labs (  8/1) WBC 3.8, globin 8.7, platelet count 375, sodium 134, potassium 4.0, creatinine 1.21             Visit Diagnosis:  No diagnosis found.  Past Psychiatric History: Please see intake H&P.  Past Medical History:  Past Medical History:  Diagnosis Date   Agitation 11/22/2017   Anoxic brain injury ([place]) 09/08/2016   C. Arrest due to respiratory failure and COPD exacerbation   Anxiety     Arthritis    "all over" (04/10/2016)   Asthma 10/18/2010   Binge eating disorder    Cardiac arrest ([place]) 09/08/2016   PEA   Carotid artery stenosis    1-39% bilateral by dopplers 11/2016   Chronic diastolic (congestive) heart failure ([place])    Chronic kidney disease    "I see a kidney dr." (04/10/2016)   Chronic pain syndrome 06/18/2012   Chronic post-traumatic stress disorder (PTSD) 05/27/2018   Chronic respiratory failure with hypoxia and hypercapnia ([place]) 06/22/2015   TRILOGY Vent >AVAPA-ES., Vt target 200-400, Max P 30 , PS max 20 , PS min 6-10 , E Max 6, E Min 4, Rate Auto AVAPS Rate 2 (titrate for pt comfort) , bleed O2 at 5l/m continuous flow .    CKD (chronic kidney disease) stage 3, GFR 30-59 ml/min (HCC) 12/15/2016   Closed displaced fracture of fifth metacarpal bone 03/21/2018   Cocaine use disorder, severe, in sustained remission ([place]) 48/05/4625   Complication of anesthesia    decreased bp, decreased heart rate   COPD (chronic obstructive pulmonary disease) ([place]) 07/08/2014   Depression    Diabetic neuropathy ([place]) 04/24/2011   Difficulty with speech 01/24/2018   Disorder of nervous system    Drug abuse ([place]) 11/21/2017   Dyslipidemia 04/24/2011   Elevated troponin 04/28/2012   Emphysema    Encephalopathy 11/21/2017   Essential hypertension 03/22/2016   Fibula fracture 07/10/2016   Frequent falls 10/11/2017   GERD (gastroesophageal reflux disease)    Gout 04/11/2017   Heart attack ([place]) 1980s   History of blood transfusion 1994   "couldn't stop bleeding from my period"   History of drug abuse in remission ([place]) 11/28/2015   Quit in 2017   Hyperlipidemia LDL goal <70    Incontinence    Manic depression ([place])    Morbid obesity ([place]) 10/18/2010   Obstructive sleep apnea 10/18/2010   On home oxygen therapy    "6L; 24/7" (04/10/2016)   OSA on CPAP    "wear mask sometimes" (04/10/2016)   Paranoid ([place])    "sometimes; I'm on RX for it" (04/10/2016)   Prolonged Q-T interval on ECG     Rectal bleeding 12/31/2015   Right carotid bruit 11/09/2016   Schizoaffective disorder, bipolar type ([place]) 04/05/2018   Seasonal allergies    Seborrheic keratoses 12/31/2013   Seizures ([place])    "don't know what kind; last one was ~ 1 yr ago" (04/10/2016)   Stroke [name]) 1980s   denies residual on 04/10/2016   Thrush 09/19/2013   Type 2 diabetes mellitus ([place]) 10/18/2010    Past Surgical History:  Procedure Laterality Date   CESAREAN SECTION  1997   HERNIA REPAIR     IR CHOLANGIOGRAM EXISTING TUBE  07/20/2016   IR PERC CHOLECYSTOSTOMY  05/10/2016   IR RADIOLOGIST EVAL & MGMT  06/08/2016   IR RADIOLOGIST EVAL & MGMT  06/29/2016   IR SINUS/FIST TUBE CHK-NON GI  07/12/2016   RIGHT/LEFT HEART CATH AND CORONARY ANGIOGRAPHY N/A 06/19/2017   Procedure: RIGHT/LEFT  HEART CATH AND CORONARY ANGIOGRAPHY;  Surgeon: Jolaine Artist, MD;  Location: [facility];  Service: Cardiovascular;  Laterality: N/A;   TIBIA IM NAIL INSERTION Right 07/12/2016   Procedure: INTRAMEDULLARY (IM) NAIL RIGHT TIBIA;  Surgeon: Leandrew Koyanagi, MD;  Location: [facility];  Service: Orthopedics;  Laterality: Right;   UMBILICAL HERNIA REPAIR  ~ 1963   "that's why I don't have a belly button"   VAGINAL HYSTERECTOMY      Family Psychiatric History: Reviewed.  Family History:  Family History  Problem Relation Age of Onset   Cancer Father        prostate   Cancer Mother        lung   Depression Mother    Depression Sister    Anxiety disorder Sister    Schizophrenia Sister    Bipolar disorder Sister    Depression Sister    Depression Brother    Heart failure Other        cousin    Social History:  Social History   Socioeconomic History   Marital status: Widowed    Spouse name: Not on file   Number of children: 3   Years of education: Not on file   Highest education level: Not on file  Occupational History   Occupation: disabled    Comment: factory production  Tobacco Use   Smoking status: Former Smoker     Packs/day: 1.50    Years: 38.00    Pack years: 57.00    Types: Cigarettes    Start date: 03/13/1977    Quit date: 04/10/2016    Years since quitting: 4.1   Smokeless tobacco: Never Used  Vaping Use   Vaping Use: Never used  Substance and Sexual Activity   Alcohol use: No    Alcohol/week: 0.0 standard drinks   Drug use: No    Types: Cocaine    Comment: 04/10/2016 "last used cocaine back in November 2017"   Sexual activity: Not Currently    Birth control/protection: Surgical  Other Topics Concern   Not on file  Social History Narrative   Has 1 son, [name]   Lives with son and his boyfriend   Her house has ramps and handrails should she ever needs them.    Her mother lives down the street from her and is a good support person in addition to her son.   She drives herself, has private transportation.    Cocaine free since 02/24/16, smoke free since 04/10/16   Social Determinants of Health   Financial Resource Strain: Not on file  Food Insecurity: Not on file  Transportation Needs: Not on file  Physical Activity: Not on file  Stress: Not on file  Social Connections: Not on file    Allergies:  Allergies  Allergen Reactions   Hydrocodone Shortness Of Breath   Hydrocodone-Acetaminophen Shortness Of Breath   Hydroxyzine Anaphylaxis and Shortness Of Breath   Latuda [Lurasidone Hcl] Anaphylaxis   Lurasidone Anaphylaxis   Magnesium-Containing Compounds Anaphylaxis    Tolerated Ensure   Prednisone Anaphylaxis, Swelling and Other (See Comments)    Tongue swelling   Tramadol Anaphylaxis and Swelling   Codeine Nausea And Vomiting   Other Rash   Sulfa Antibiotics Itching   Tape Rash    Metabolic Disorder Labs: Lab Results  Component Value Date   HGBA1C 5.3 05/11/2020   MPG 105.41 05/11/2020   MPG 91.06 02/20/2020   No results found for: PROLACTIN Lab Results  Component Value Date  CHOL 127 08/05/2018   TRIG 53 08/05/2018   HDL 62 08/05/2018   CHOLHDL 2.0 08/05/2018    VLDL 38 01/07/2017   LDLCALC 54 08/05/2018   LDLCALC 56 01/07/2017   Lab Results  Component Value Date   TSH 0.832 05/14/2020   TSH 0.358 02/18/2020    Therapeutic Level Labs: No results found for: LITHIUM Lab Results  Component Value Date   VALPROATE 20 (L) 09/09/2016   VALPROATE 46 (L) 07/10/2016   No components found for:  CBMZ  Current Medications: Current Outpatient Medications  Medication Sig Dispense Refill   albuterol (PROVENTIL) (2.5 MG/3ML) 0.083% nebulizer solution Take 3 mLs (2.5 mg total) by nebulization every 6 (six) hours as needed for wheezing or shortness of breath. 360 mL 5   albuterol (VENTOLIN HFA) 108 (90 Base) MCG/ACT inhaler Inhale 2 puffs into the lungs every 6 (six) hours as needed for wheezing or shortness of breath. 18 g 5   asenapine (SAPHRIS) 5 MG SUBL 24 hr tablet Place 1 tablet (5 mg total) under the tongue 2 (two) times daily. 60 tablet 0   Asenapine Maleate (SAPHRIS) 2.5 MG SUBL Place 1 tablet (2.5 mg total) under the tongue 2 (two) times daily. For 3 days, then increase to 5 mg. 6 tablet 0   diazepam (VALIUM) 10 MG tablet Take 1 tablet (10 mg total) by mouth 3 (three) times daily as needed for anxiety. 90 tablet 2   diclofenac Sodium (VOLTAREN) 1 % GEL Apply 4 g topically 4 (four) times daily. 100 g 0   escitalopram (LEXAPRO) 10 MG tablet Take 1 tablet (10 mg total) by mouth daily. 30 tablet 2   feeding supplement (ENSURE ENLIVE / ENSURE PLUS) LIQD Take 237 mLs by mouth 2 (two) times daily between meals.     Fluticasone-Umeclidin-Vilant (TRELEGY ELLIPTA) 100-62.5-25 MCG/INH AEPB Inhale 1 puff into the lungs daily. 60 each 3   furosemide (LASIX) 20 MG tablet Take 1 tablet (20 mg total) by mouth daily. 30 tablet 1   gabapentin (NEURONTIN) 600 MG tablet Take 0.5 tablets (300 mg total) by mouth 2 (two) times daily. 60 tablet 0   Misc. Devices [name] Power wheelchair.  Diagnosis-frequent falls, bilateral knee osteoarthritis 1 each 0   montelukast  (SINGULAIR) 10 MG tablet Take 1 tablet (10 mg total) by mouth at bedtime. 30 tablet 1   omeprazole (PRILOSEC) 40 MG capsule Take 1 capsule (40 mg total) by mouth daily. 30 capsule 1   oxybutynin (DITROPAN) 5 MG tablet TAKE 1 TABLET(5 MG) BY MOUTH TWICE DAILY 180 tablet 0   potassium chloride (KLOR-CON) 10 MEQ tablet Take 1 tablet (10 mEq total) by mouth daily. 30 tablet 1   silver sulfADIAZINE (SILVADENE) 1 % cream Apply 1 application topically 2 (two) times daily. 50 g 1   thiamine 100 MG tablet Take 1 tablet (100 mg total) by mouth daily.     tiZANidine (ZANAFLEX) 4 MG tablet Take 1 tablet (4 mg total) by mouth every 8 (eight) hours as needed for muscle spasms. 30 tablet 0   topiramate (TOPAMAX) 25 MG tablet Take 1 tablet (25 mg total) by mouth 2 (two) times daily. 60 tablet 0   Current Facility-Administered Medications  Medication Dose Route Frequency Provider Last Rate Last Admin   betamethasone acetate-betamethasone sodium phosphate (CELESTONE) injection 6 mg  6 mg Other Once Magnus Sinning, MD           Psychiatric Specialty Exam: please note inherent difficulties in obtaining a full  MSE in the context of phone communication Review of Systems  Psychiatric/Behavioral:  The patient is nervous/anxious.   All other systems reviewed and are negative. Reports exertional dyspnea, pain in feet/toes   There were no vitals taken for this visit.There is no height or weight on file to calculate BMI.  General Appearance: NA  Eye Contact:  NA  Speech:  Normal/ slow   Volume:  Normal  Mood:  Reports mood as "" not good ". Endorses some depression   Affect:  Appears irritable at times , affect noted to improve during session, laughing at times appropriately   Thought Process:  Goal Directed  Orientation:  Full (Time, Place, and Person)  Thought Content:  no SI, no HI , currently does not endorse hallucinations and does not express delusional ideations, focused on medication issues   Suicidal  Thoughts:  No denies SI   Homicidal Thoughts:  No  Memory:  Recent and remote grossly intact   Judgement:  Fair  Insight:  Fair  Psychomotor Activity:  NA  Concentration:  Grossly intact   Recall:  Grossly intact   Fund of Knowledge: Good  Language: Good  Akathisia:  Negative  Handed:  Right  AIMS (if indicated): not done  Assets:  Desire for Improvement Financial Resources/Insurance Housing Resilience  ADL's:  Intact  Cognition: WNL  Sleep:  Fair   Screenings: AUDIT    Flowsheet Row Admission (Discharged) from 12/01/2015 in [facility]  Alcohol Use Disorder Identification Test Final Score (AUDIT) 0      GAD-7    Flowsheet Row Office Visit from 05/21/2020 in [facility] Visit from 03/21/2018 in [facility] for [facility]  Total GAD-7 Score 7 4      PHQ2-9    [place] Visit from 05/21/2020 in [facility] Visit from confidential encounter on 12/11/2018 Office Visit from confidential encounter on 12/10/2018 Office Visit from confidential encounter on 11/18/2018 Office Visit from confidential encounter on 11/08/2018  PHQ-2 Total Score 2 0 4 2 1   PHQ-9 Total Score 8 -- 10 8 1       Flowsheet Row ED to Hosp-Admission (Discharged) from 02/18/2020 in [facility] ED from 02/03/2019 in [facility] ED from 10/06/2018 in [facility]  C-SSRS RISK CATEGORY Error: Q6 is Yes, you must answer 7 Error: Q6 is Yes, you must answer 7 No Risk        Assessment and Plan:   63 y old with schizoaffective disorder bipolar type (vs paranoid  schizophrenia), chronic PTSD and binge eating disorder.  She has medical history of COPD, Respiratory Failure in the past, leading to cariac arrest in 2018, Carotid Artery Stenosis, CHF, CKD , and reported remote history of seizures ,  CVA.  She recently  ( 9/30 ) went to ED reporting foot pain , depression, anxiety. She was cleared by psychiatry and was discharged   Had missed recent appointments - see above .  Today reports depression/anxiety in the context of chronic medical illnesses and states she does not feel she is on the medications that were helping her . We reviewed and her medication regimen under Dr Patsy Lager up to earlier this year was Asenapine 10 mgr BID, Minipress 2 mgr QHS, Neurontin 600 mgr BID, Lexapro 20 mgr QDAY, Vyvanse 20 mgr BID, Topamax 25 mgr BID, Valium 10 mgr TID.   Currently  taking Asenapine , Neurontin. States not currently taking Lexapro ( apparently ran out ) ,or Valium ( as well). Reports she has not recently taken Valium     Dx: Schizoaffective disorder bipolar type   Plan:  We reviewed medications , side effects, importance of appointment compliance .  Continue Saphris 10 mgr BID, which she reports she has been taking without side effects, for psychosis/mood disorder , D/C Topamax ( has not been taking recently ) as reports was not well tolerated , resume Lexapro at 10 mgr QDAY initially for depression, anxiety, and resume Valium at 5mg r BID  PRN for anxiety initially We reviewed rationale to avoid stimulant ( was on Vyvanse ) medications and to limit/minimize BZD medications,  based on her medical history  Continue psychotherapy  *During today's appointment patient expressed  frustration about not being on same medications she had been on before and  repeatedly  expressed ambivalnece whether to continue seeing this provider / thinking of  establishing care with another psychiatrist .  However, she declined offer for referrals to other local outpatient psychiatrists at this time, and did specifically request to make a follow up appointment with me     Continue  -Asenapine 10 mgr BID  -Lexapro 10 mgr QDAY -Valium 15mg r TID PRN for anxiety  Next appointment in 4 weeks  Agrees to  contact clinic sooner if any worsening prior or medication concerns prior     Gabriel Earing, MD

## 2020-10-20 NOTE — Telephone Encounter (Signed)
Can you please speak with the patient to address her concerns about her environment, what medication she is referring to? I can also place a HHA order for her.  Thank you.

## 2020-10-21 ENCOUNTER — Other Ambulatory Visit: Payer: Medicare HMO

## 2020-10-21 NOTE — Telephone Encounter (Signed)
Call returned to patient.  She explained that the psychiatrist in the hospital gave her trazodone and is caused her to hallucinate. She then said that she is leaving [place]. She wants to be around family that cares for her and she needs to contact her son. She hopes to pack up and leave tomorrow. When asked where her son lives, she said " up [place]" but did not state a specific city/state.  She needed to end the call to contact that son and requested a call back tomorrow- 10/22/2020.   Eyvonne Mechanic - can you please try to contact her? Not sure when/ if she will be leaving the area and if we need to refer for PCS. Thanks

## 2020-10-21 NOTE — Telephone Encounter (Signed)
Called patient to see if she still wanted an appointment with [facility]. No answer and unable to leave vm.

## 2020-10-22 NOTE — Telephone Encounter (Signed)
Spoke wit pt  stated she did not leave yet and is about to be leaving up [place] . Did not want to provide location, stated she would like to be referred to another psychiatrist , refuses to see the old practice stated "he did not do anything for her". No other concerns verbalized at this time.

## 2020-10-23 ENCOUNTER — Other Ambulatory Visit: Payer: Self-pay | Admitting: Family Medicine

## 2020-10-23 DIAGNOSIS — K219 Gastro-esophageal reflux disease without esophagitis: Secondary | ICD-10-CM

## 2020-10-23 NOTE — Telephone Encounter (Signed)
If she is relocating I would recommend she gets established when she gets there as there is no point placing a referral if she would be leaving soon

## 2020-10-25 ENCOUNTER — Telehealth (HOSPITAL_COMMUNITY): Payer: Self-pay

## 2020-10-25 NOTE — Telephone Encounter (Signed)
Pt is calling to state that she will be relocating and she does not wish to disclose. Pt would like to be sure that her medications are updated and have refills. That way she has time to find a new pcp.  Pt she will call back tomorrow because she does not have a phone at this time.

## 2020-10-25 NOTE — Telephone Encounter (Signed)
WRITER S/W BRITTNEY AT [facility] AND SUBMITTED PA FOR PATIENT'S ASENAPINE (SAPHRIS) 5MG . WAITING DETERMINATION

## 2020-10-28 ENCOUNTER — Telehealth (HOSPITAL_COMMUNITY): Payer: Self-pay

## 2020-10-28 NOTE — Telephone Encounter (Signed)
Patient called regarding her medications. She stated that her Trazodone causes hallucinations. She stated that her Lexapro 10mg  dosage isn't working and would like it increased to 20mg . She stated that she can't wait every 8 hours to take her Valium and would like to take it TID. Also, patient stated that the Valium 5mg  isn't working and would like it increased to 10mg . Patient stated that she's gaining weight again due to being off the Vyvanse. She stated that the Vyvanse helped to control that. She also stated that she had heart problems before even starting the Vyvanse and she has obsessive compulsive behavior which gives her bad thoughts. She stated that the Vyvanse helps her with that as well. Please review and advise. Thank you

## 2020-10-29 ENCOUNTER — Ambulatory Visit: Payer: Self-pay | Admitting: *Deleted
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
## 2020-11-02 MED ORDER — TIZANIDINE HCL 4 MG PO TABS: 4.0000 mg | ORAL_TABLET | Freq: Three times a day (TID) | ORAL | 1 refills | Status: DC | PRN

## 2020-11-02 NOTE — Telephone Encounter (Signed)
I have sent a prescription for a muscle relaxant to her pharmacy.

## 2020-11-02 NOTE — Addendum Note (Signed)
Addended by: [name] on: 11/02/2020 06:02 PM   Modules accepted: Orders

## 2020-11-03 IMAGING — DX DG CHEST 1V PORT
1 series · 1 of 1 positions shown · non-contrast
Comparison: Chest radiograph May 12, 2017

CLINICAL DATA: Intubation.

EXAM:
PORTABLE CHEST 1 VIEW

[chest ap]
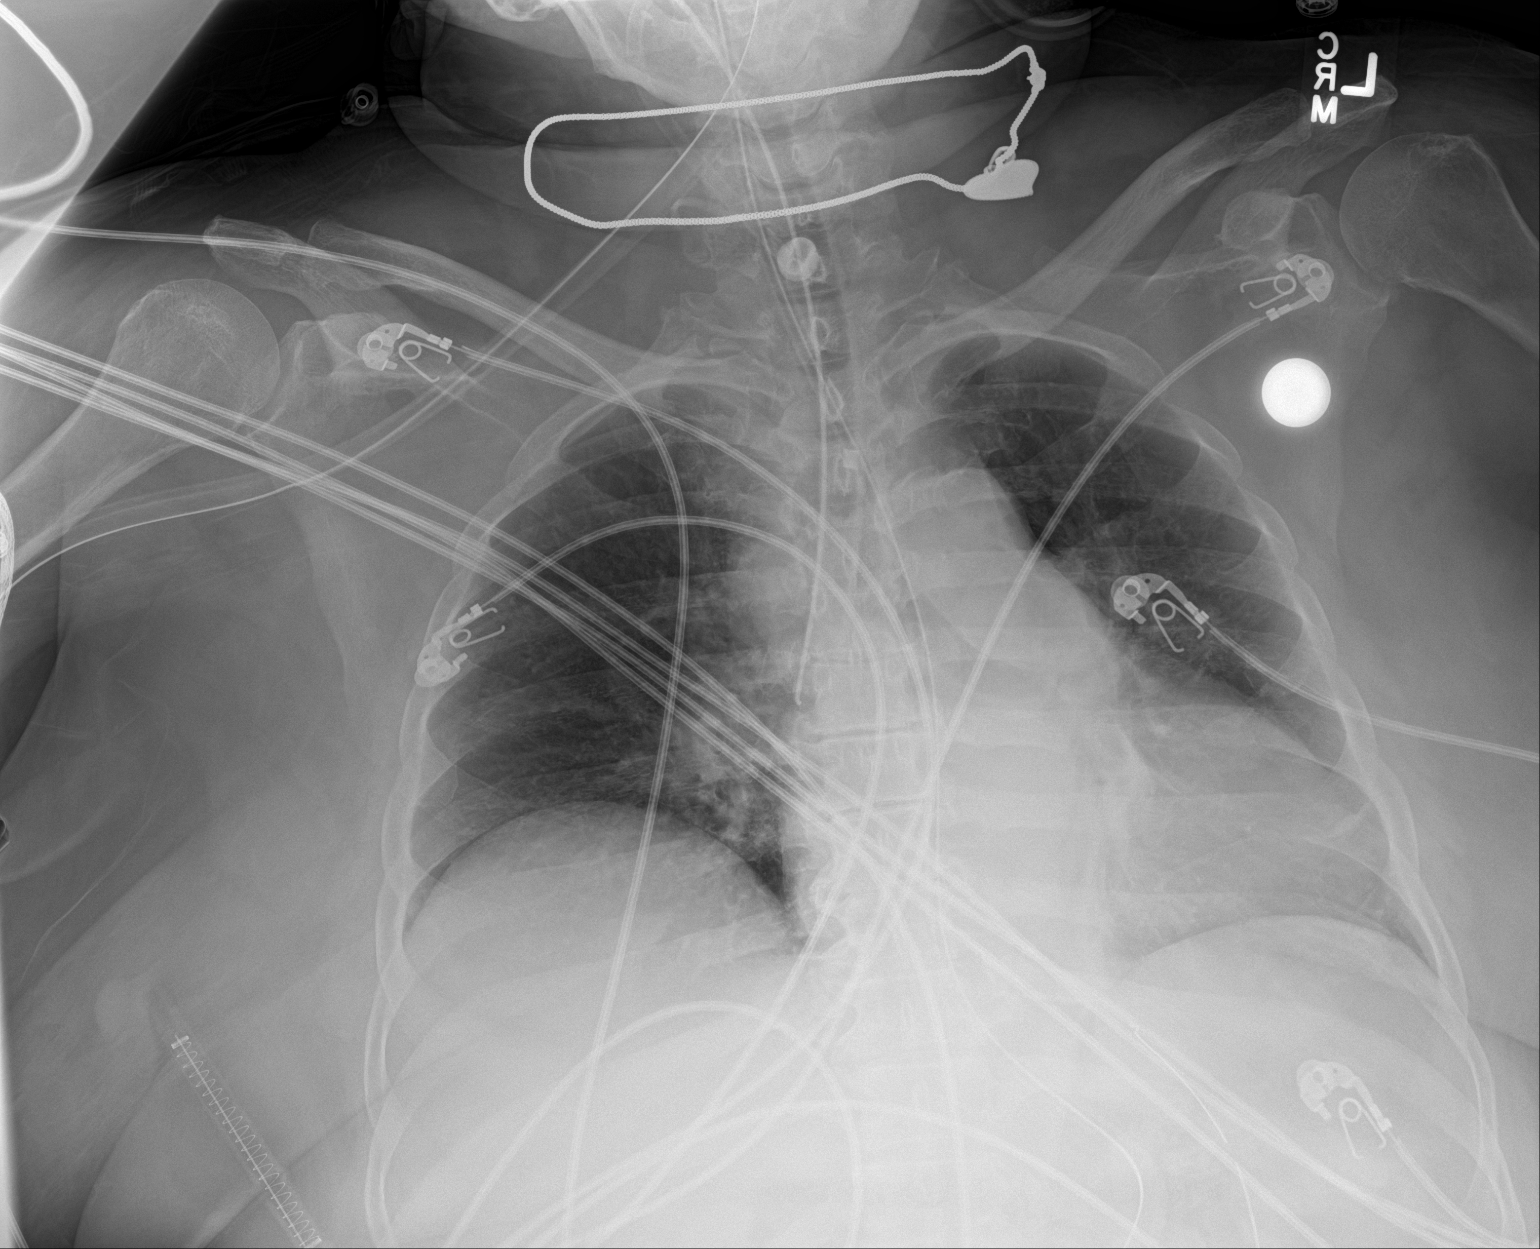

[1 of 1 positions shown; findings below may reference images not displayed]

FINDINGS: RIGHT mainstem bronchus intubation, distal tip projecting 3 cm below
the carina.

Cardiomediastinal silhouette is unremarkable for this low
inspiratory examination with crowded vasculature markings. LEFT lung
base bandlike density. LEFT lower lobe consolidation with air
bronchograms. Trachea projects midline and there is no pneumothorax.
Included soft tissue planes and osseous structures are
non-suspicious. Nasogastric tube looped in stomach, distal tip
projecting gastric cardia.
IMPRESSION: 1. RIGHT mainstem bronchus intubation. Recommend 5 cm retraction.
Nasogastric tube tip projects proximal stomach.
2. LEFT lower lobe atelectasis versus pneumonia.
3. Acute findings discussed with and reconfirmed by Dr.MAYA
ISSA on 11/21/2017 at [DATE].

## 2020-11-03 IMAGING — CT CT HEAD W/O CM
4 series · 16 of 47 positions shown, 18 images · non-contrast
Comparison: 01/05/2017.

CLINICAL DATA: Found unresponsive. History of chronic renal
disease. History of cocaine abuse.

EXAM:
CT HEAD WITHOUT CONTRAST
TECHNIQUE: Contiguous axial images were obtained from the base of the skull
through the vertex without intravenous contrast.

[Series 3: head wo · axial · 0.45mm/px · z∈[+1210,+1340]mm · 7 of 36 slices shown, 9 images]
[im 5/36  brain]
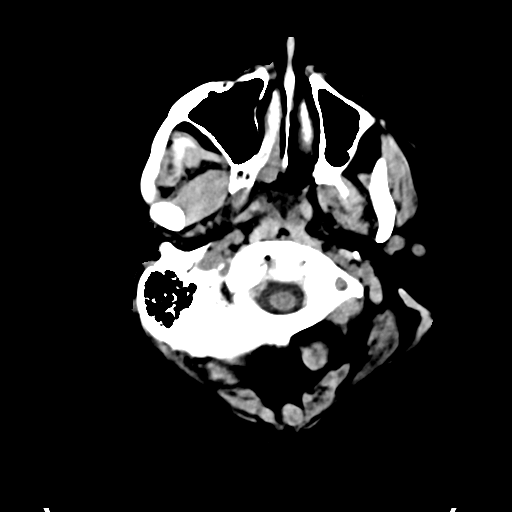
[im 5/36  bone]
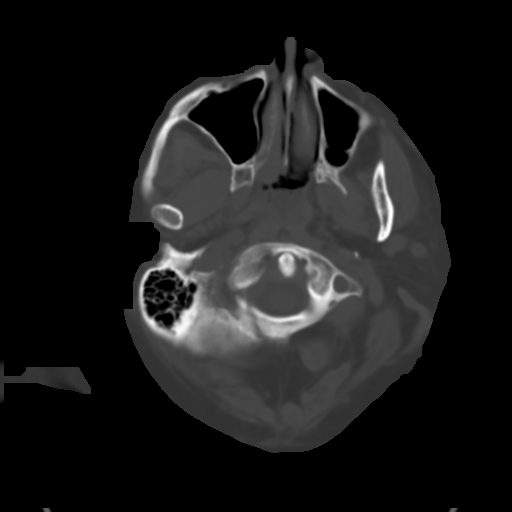
[im 9/36  brain]
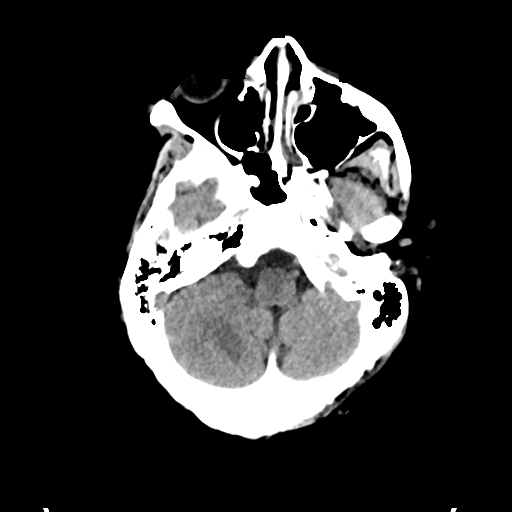
[im 14/36  brain]
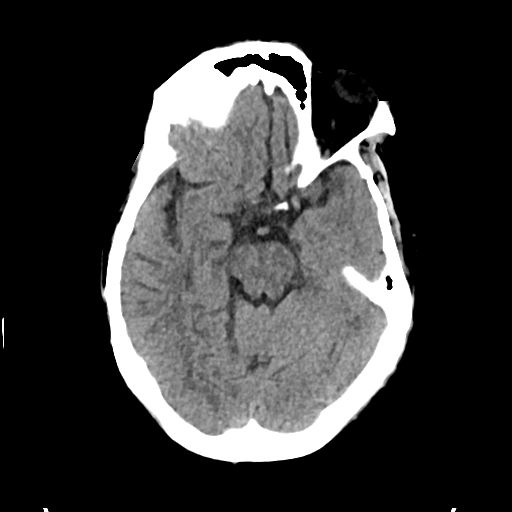
[im 18/36  brain]
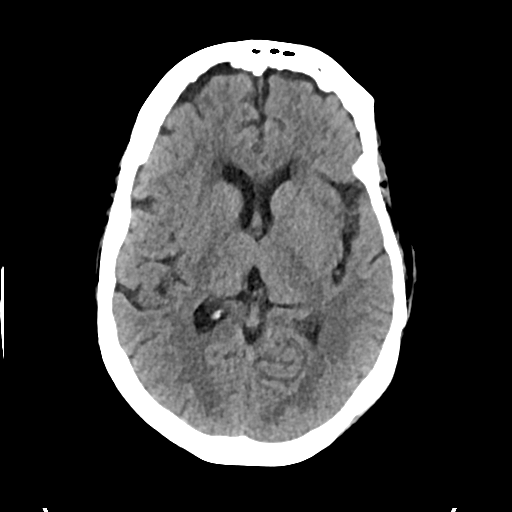
[im 22/36  brain]
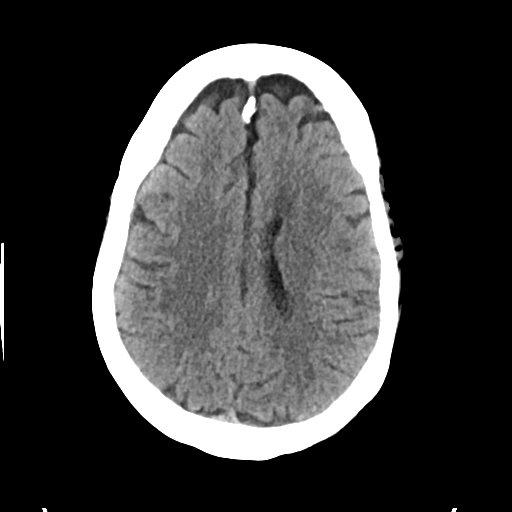
[im 22/36  bone]
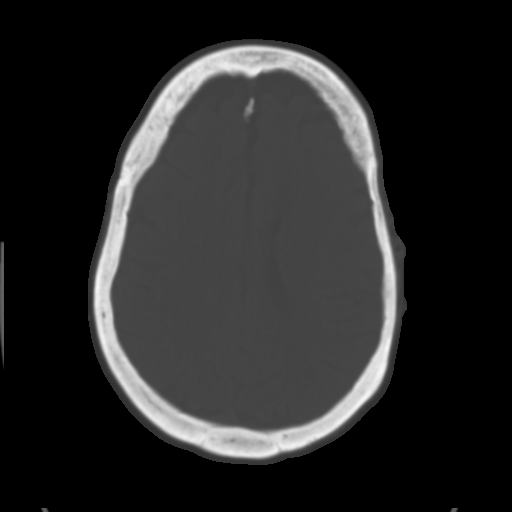
[im 27/36  brain]
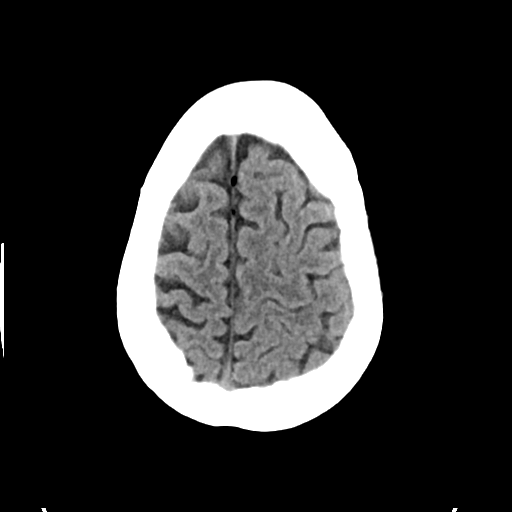
[im 31/36  brain]
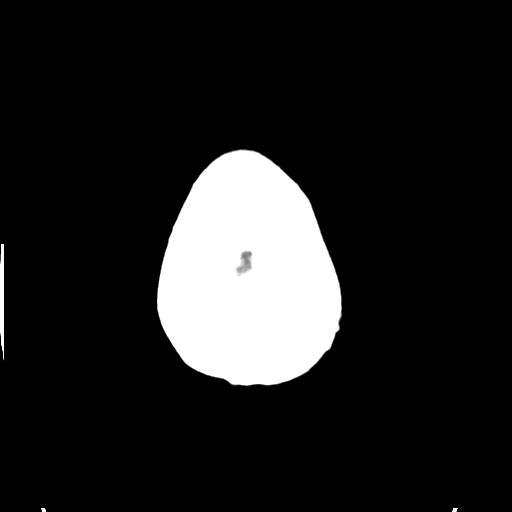

[Series 4: head bone · axial · 0.45mm/px · z∈[+1206,+1242]mm · 3 of 89 slices shown]
[im 9/89  bone]
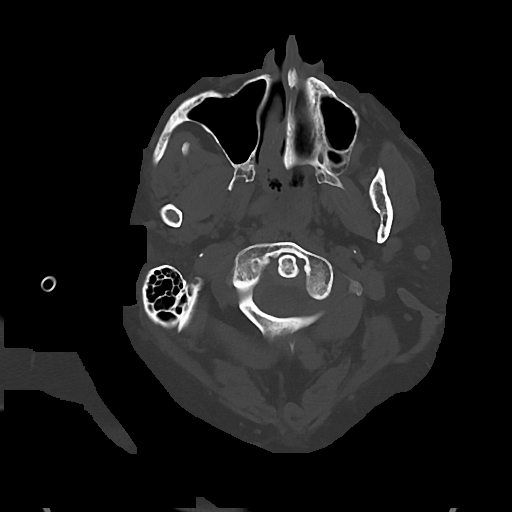
[im 18/89  bone]
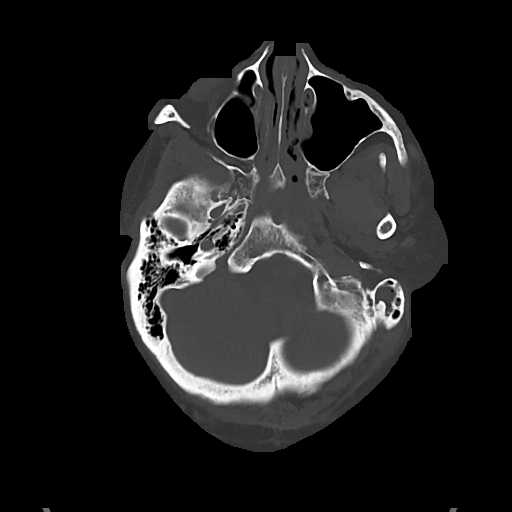
[im 27/89  bone]
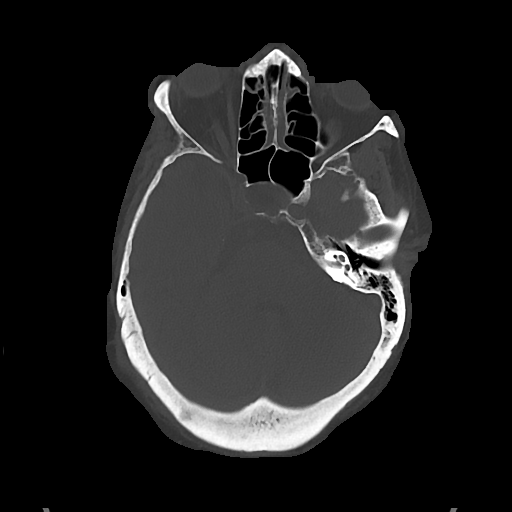

[Series 5: cor soft · coronal · 0.35mm/px · 3 of 70 slices shown]
[im 24/70  brain]
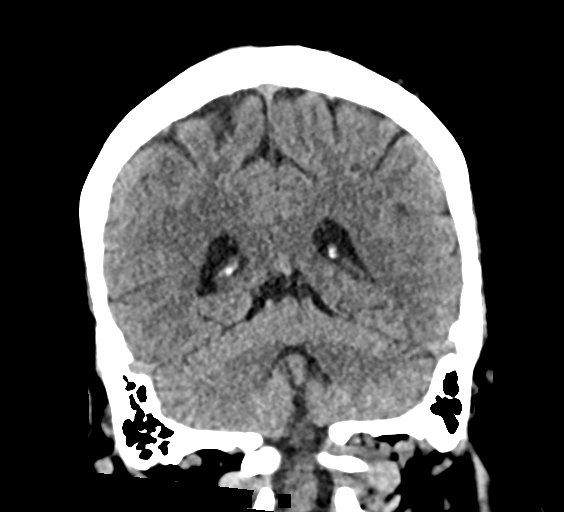
[im 31/70  brain]
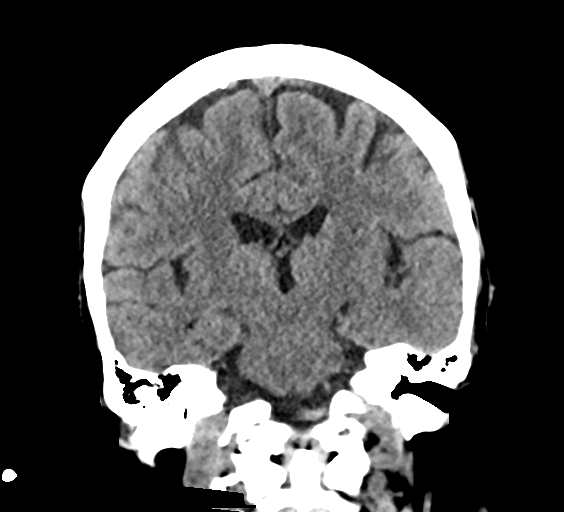
[im 39/70  brain]
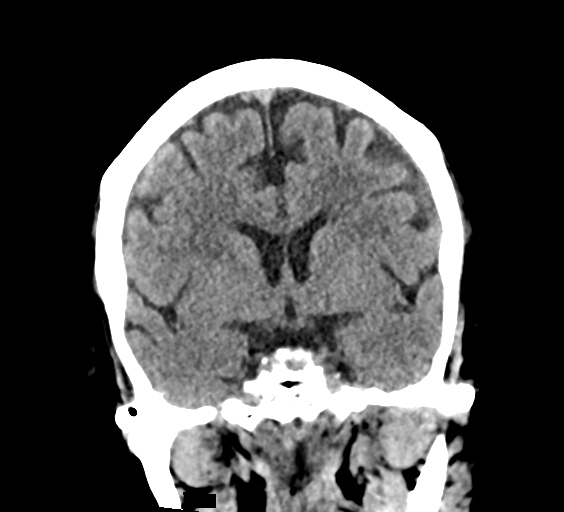

[Series 6: sag soft · sagittal · 0.35mm/px · 3 of 60 slices shown]
[im 20/60  brain]
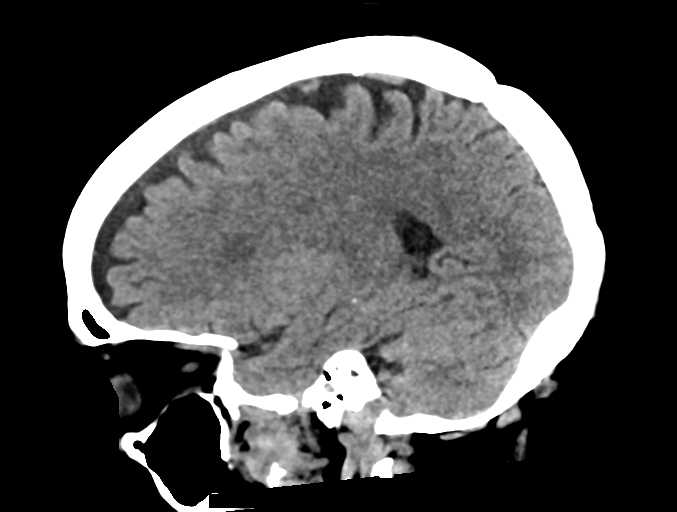
[im 30/60  brain]
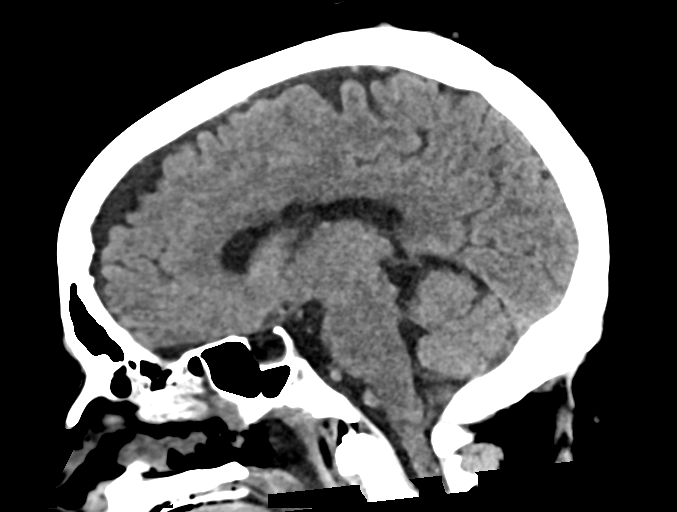
[im 40/60  brain]
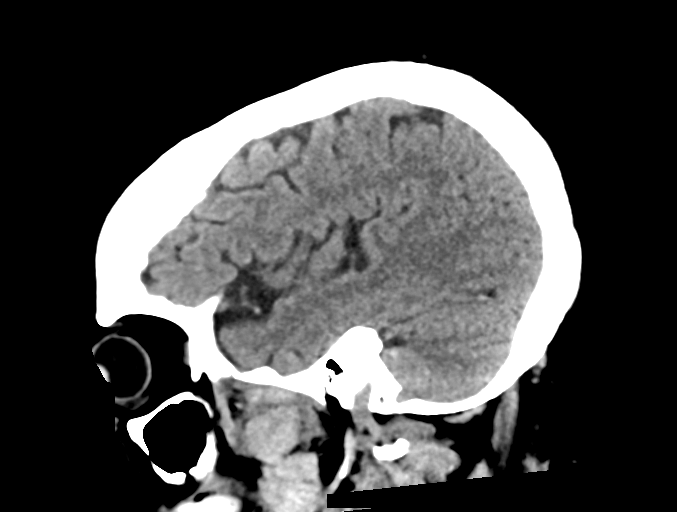

[16 of 47 positions shown; findings below may reference images not displayed]

FINDINGS: Brain: No evidence for acute infarction, hemorrhage, mass lesion,
hydrocephalus, or extra-axial fluid. Slight premature for age
atrophy. Slight hypoattenuation of white matter, could represent
early small vessel disease.

Vascular: No hyperdense vessel or unexpected calcification.

Skull: Calvarium intact. Scalp soft tissue densities likely
represent sebaceous cysts.

Sinuses/Orbits: No sinus disease.  Negative orbits.

Other: None.
IMPRESSION: No acute intracranial abnormality. No cause is seen for the reported
abnormality. Stable mild atrophy and small vessel disease.

## 2020-11-04 ENCOUNTER — Telehealth: Payer: Self-pay | Admitting: Family Medicine

## 2020-11-04 NOTE — Telephone Encounter (Signed)
Copied from [place] [id number]. Topic: Appointment Scheduling - Scheduling Inquiry for Clinic >> Nov 04, 2020  1:35 PM Pawlus, Brayton Layman A wrote: Reason for CRM: Pt wanted to set up an appt with the social worker / asked for an appt for counseling, please advise.

## 2020-11-05 ENCOUNTER — Ambulatory Visit (HOSPITAL_BASED_OUTPATIENT_CLINIC_OR_DEPARTMENT_OTHER): Payer: Medicare HMO | Admitting: Nurse Practitioner

## 2020-11-05 ENCOUNTER — Other Ambulatory Visit: Payer: Self-pay | Admitting: Family Medicine

## 2020-11-05 DIAGNOSIS — R600 Localized edema: Secondary | ICD-10-CM

## 2020-11-05 NOTE — Telephone Encounter (Signed)
Requested medications are due for refill today NO  Requested medications are on the active medication list NO, dose inconsistent with current med list  Last refill 09/26/20  Last visit 10/07/20  Future visit scheduled 11/10/20  Notes to clinic Dose inconsistent with current med list.  Requested Prescriptions  Pending Prescriptions Disp Refills   furosemide (LASIX) 20 MG tablet [Pharmacy Med Name: FUROSEMIDE 20MG  TABLETS] 30 tablet 1    Sig: TAKE 1 TABLET(20 MG) BY MOUTH DAILY     Cardiovascular:  Diuretics - Loop Failed - 11/05/2020  9:31 AM      Failed - Ca in normal range and within 360 days    Calcium  Date Value Ref Range Status  08/16/2020 8.1 (L) 8.9 - 10.3 mg/dL Final   Calcium, Ion  Date Value Ref Range Status  10/27/2019 1.25 1.15 - 1.40 mmol/L Final          Failed - Na in normal range and within 360 days    Sodium  Date Value Ref Range Status  08/16/2020 134 (L) 135 - 145 mmol/L Final  07/13/2020 131 (L) 134 - 144 mmol/L Final          Failed - Cr in normal range and within 360 days    Creat  Date Value Ref Range Status  02/18/2016 1.22 (H) 0.50 - 1.05 mg/dL Final    Comment:      For patients > or = 61 years of age: The upper reference limit for Creatinine is approximately 13% higher for people identified as African-American.      Creatinine, Ser  Date Value Ref Range Status  08/16/2020 1.21 (H) 0.44 - 1.00 mg/dL Final   Creatinine,U  Date Value Ref Range Status  06/17/2012 521.94 mg/dL Final    Comment:    Result confirmed by automatic dilution.    Cutoff Values for Urine Drug Screen, Pain Mgmt          Drug Class           Cutoff (ng/mL)          Amphetamines             500          Barbiturates             200          Cocaine Metabolites      150          Benzodiazepines          200          Methadone                300          Opiates                  300          Phencyclidine             25          Propoxyphene              300          Marijuana Metabolites     50    For medical purposes only.   Creatinine, Urine  Date Value Ref Range Status  02/19/2020 300.84 mg/dL Final    Comment:    Performed at [facility], [place] [address], [place], [place] [address]  Passed - K in normal range and within 360 days    Potassium  Date Value Ref Range Status  08/16/2020 4.0 3.5 - 5.1 mmol/L Final          Passed - Last BP in normal range    BP Readings from Last 1 Encounters:  10/17/20 94/67          Passed - Valid encounter within last 6 months    Recent Outpatient Visits           4 weeks ago Diarrhea, unspecified type   [place], Enobong, MD   1 month ago Tinea pedis of both feet   [place], Charlane Ferretti, MD   2 months ago Diarrhea, unspecified type   [place], Charlane Ferretti, MD   5 months ago Heel ulcer, left, with fat layer exposed [facility])   [place], Enobong, MD   5 months ago Edema of both legs   [place], MD       Future Appointments             In 5 days Mayers, Loraine Grip, PA-C [place]

## 2020-11-09 ENCOUNTER — Ambulatory Visit: Payer: Self-pay | Admitting: *Deleted

## 2020-11-09 ENCOUNTER — Ambulatory Visit (HOSPITAL_COMMUNITY)
Admission: EM | Admit: 2020-11-09 | Discharge: 2020-11-09 | Disposition: A | Discharge status: Home / Self Care | Payer: 59 | Attending: Nurse Practitioner | Admitting: Nurse Practitioner

## 2020-11-09 ENCOUNTER — Telehealth: Payer: Self-pay

## 2020-11-09 ENCOUNTER — Telehealth: Payer: Self-pay | Admitting: *Deleted

## 2020-11-09 ENCOUNTER — Ambulatory Visit: Payer: Self-pay

## 2020-11-09 DIAGNOSIS — I6529 Occlusion and stenosis of unspecified carotid artery: Secondary | ICD-10-CM | POA: Diagnosis not present

## 2020-11-09 DIAGNOSIS — N189 Chronic kidney disease, unspecified: Secondary | ICD-10-CM | POA: Diagnosis not present

## 2020-11-09 DIAGNOSIS — F250 Schizoaffective disorder, bipolar type: Secondary | ICD-10-CM | POA: Insufficient documentation

## 2020-11-09 DIAGNOSIS — Z8674 Personal history of sudden cardiac arrest: Secondary | ICD-10-CM | POA: Insufficient documentation

## 2020-11-09 DIAGNOSIS — R443 Hallucinations, unspecified: Secondary | ICD-10-CM | POA: Diagnosis present

## 2020-11-09 DIAGNOSIS — J449 Chronic obstructive pulmonary disease, unspecified: Secondary | ICD-10-CM | POA: Insufficient documentation

## 2020-11-09 DIAGNOSIS — R45851 Suicidal ideations: Secondary | ICD-10-CM | POA: Insufficient documentation

## 2020-11-09 DIAGNOSIS — I509 Heart failure, unspecified: Secondary | ICD-10-CM | POA: Diagnosis not present
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
## 2020-11-09 NOTE — Telephone Encounter (Signed)
Copied from [place] [id number]. Topic: General - Other >> Nov 08, 2020  3:19 PM Tessa Lerner A wrote: Reason for CRM: The patient's psychiatrist's office has called to request orders for an EKG for the patient   Pam with [name]' office [place] shares that the patient has request to increase the dosage of their medications   Before that increase can be done [name] would like the patient to have an EKG  [name]' office has stressed the urgency of this request to coincide with time-sensitive scheduling for the patient   Please contact to further discuss these orders
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
## 2020-11-09 NOTE — Telephone Encounter (Signed)
Pt. States she received a letter about x-ray on 05/11/20. Pt. Tearful, "I'm afraid it's something really bad." Pt. Has an appointment tomorrow. Pt. Asking to speak to Dr. Margarita Rana "today if possible." Please advise pt.

## 2020-11-09 NOTE — Telephone Encounter (Signed)
Spoke with pt. Earlier in regard to letter pt. Received. See telephone note.

## 2020-11-09 NOTE — Telephone Encounter (Signed)
Answer Assessment - Initial Assessment Questions 1. CONCERN: "Did anything happen that prompted you to call today?"      Feels threatened and unsafe in her home  2. ANXIETY SYMPTOMS: "Can you describe how you (your loved one; patient) have been feeling?" (e.g., tense, restless, panicky, anxious, keyed up, overwhelmed, sense of impending doom).      Gittery, anxious, panicky feel the end is coming  3. ONSET: "How long have you been feeling this way?" (e.g., hours, days, weeks)     *No Answer* 4. SEVERITY: "How would you rate the level of anxiety?" (e.g., 0 - 10; or mild, moderate, severe).     *No Answer* 5. FUNCTIONAL IMPAIRMENT: "How have these feelings affected your ability to do daily activities?" "Have you had more difficulty than usual doing your normal daily activities?" (e.g., getting better, same, worse; self-care, school, work, interactions)     *No Answer* 6. HISTORY: "Have you felt this way before?" "Have you ever been diagnosed with an anxiety problem in the past?" (e.g., generalized anxiety disorder, panic attacks, PTSD). If Yes, ask: "How was this problem treated?" (e.g., medicines, counseling, etc.)     *No Answer* 7. RISK OF HARM - SUICIDAL IDEATION: "Do you ever have thoughts of hurting or killing yourself?" If Yes, ask:  "Do you have these feelings now?" "Do you have a plan on how you would do this?"     *No Answer* 8. TREATMENT:  "What has been done so far to treat this anxiety?" (e.g., medicines, relaxation strategies). "What has helped?"     *No Answer* 9. TREATMENT - THERAPIST: "Do you have a counselor or therapist? Name?"     *No Answer* 10. POTENTIAL TRIGGERS: "Do you drink caffeinated beverages (e.g., coffee, colas, teas), and how much daily?" "Do you drink alcohol or use any drugs?" "Have you started any new medicines recently?"     *No Answer* 10. PATIENT SUPPORT: "Who is with you now?" "Who do you live with?" "Do you have family or friends who you can talk to?"         *No Answer* 11. OTHER SYMPTOMS: "Do you have any other symptoms?" (e.g., feeling depressed, trouble concentrating, trouble sleeping, trouble breathing, palpitations or fast heartbeat, chest pain, sweating, nausea, or diarrhea)       *No Answer* 12. PREGNANCY: "Is there any chance you are pregnant?" "When was your last menstrual period?"       *No Answer*  Protocols used: Anxiety and Panic Attack-A-AH

## 2020-11-10 ENCOUNTER — Ambulatory Visit: Payer: Self-pay

## 2020-11-10 ENCOUNTER — Ambulatory Visit: Payer: 59 | Admitting: Physician Assistant

## 2020-11-10 NOTE — Telephone Encounter (Signed)
Pt states that she was going to the ED

## 2020-11-10 NOTE — Telephone Encounter (Signed)
She missed appointment to discuss this

## 2020-11-10 NOTE — BH Assessment (Signed)
Care Management - Follow Up [facility] Discharges   Writer attempted to make contact with patient today and was unsuccessful.  Patient phone just rang.  Voicemail is not set up.   Per chart review, patient was provided with outpatient resources for medication management out individual therapy.

## 2020-11-10 NOTE — Telephone Encounter (Signed)
FYI

## 2020-11-10 NOTE — Telephone Encounter (Signed)
Pt was called to come in for a EKG patient states that she does not want to see Dr Belenda Cruise anymore due to him taking her off all her medications.  She states that she will be going to the hospital to get checked out due to her not feeling well.

## 2020-11-10 NOTE — Telephone Encounter (Signed)
She needs an appointment to discuss her concerns.

## 2020-11-10 NOTE — Telephone Encounter (Signed)
  Pt. Reports she has had thoughts of harming herself. Was seen at [facility] yesterday. Does not have a plan to harm herself or carry ir out. States she is supposed to talk to her behavioral nurse this morning.States she will call 911 if "If I feel bad after I talk to her." States her BP was high last night." States she also is talking to her lawyer this morning "about testifying in court." "I want her to know I was seen in ED yesterday." States "I'm not going to hurt myself . I may go to ED today." Instructed to call 911 for any thoughts of self harm. Verbalizes understanding.   Reason for Disposition  [1] Depression symptoms (sadness, hopelessness, decreased energy) AND [2] unable to do any normal activities (e.g., self care, school, work; in comparison to baseline).  Answer Assessment - Initial Assessment Questions 1. MAIN CONCERN: "What happened that made you call today?"     Thinking of harming herself 2. RISK OF HARM - SUICIDAL IDEATION:  "Do you ever have thoughts of hurting or killing yourself?"  (e.g., yes, no, no but preoccupation with thoughts about death)   - WISH TO BE DEAD:  "Have you wished you were dead or wished you could go to sleep and not wake up?"   - INTENT:  "Have you had any thoughts of hurting or killing yourself?" (e.g., yes, no, N/A) If Yes, ask: "Are you having these thoughts about killing yourself right NOW?"   - PLAN: "Have you thought about how you might do this?" "Do you have a specific plan for how you would do this?" (e.g., gun, knife, overdose, no plan, N/A)   - ACCESS: If yes to PLAN, "Do you have access to ?" (e.g., pills, gun in house, knife in kitchen)     No gun 3. RISK OF HARM - SUICIDE ATTEMPT: "Have you tried to harm yourself recently?" If Yes, ask: When was this?"       No 4. RISK OF HARM - SUICIDAL BEHAVIOR: "Have you ever done anything, started to do anything, or prepared to do anything to end your life?" (e.g., collected pills, bought a gun,  wrote a suicide note, cut yourself, started but changed your mind)     No 5. EVENTS AND STRESSORS: "Has there been any new stress or recent changes in your life?" (e.g., death of loved one, homelessness, negative event, relationship breakup, work)     Problems with son 46. FUNCTIONAL IMPAIRMENT: "How have things been going for you overall? Have you had more difficulty than usual doing your normal daily activities?"  (e.g., better, same, worse; self-care, school, work, interactions)     Yes 7. SUPPORT: "Who is with you now?" "Who do you live with?" "Do you have family or friends who you can talk to?"      Mother is a support 8. THERAPIST: "Do you have a counselor or therapist? Name?"     No 9. ALCOHOL USE OR SUBSTANCE USE (DRUG USE): "Do you drink alcohol or use any illegal drugs?"     No 10. OTHER: "Do you have any other physical symptoms right now?" (e.g., fever)       Has not slept 11. PREGNANCY or POSTPARTUM: "Is there any chance you are pregnant?" "When was your last menstrual period?" "Were you recently pregnant?" "When did you give birth?"       No  Protocols used: Suicide Concerns-A-AH

## 2020-11-10 NOTE — Telephone Encounter (Signed)
If she has suicidal intents or ideation she needs to be seen at the ED; since she has an appointment with her behavioral health nurse today I will defer to them.

## 2020-11-11 ENCOUNTER — Telehealth: Payer: Self-pay | Admitting: Family Medicine

## 2020-11-11 NOTE — Telephone Encounter (Signed)
Pt is calling to see if she can can be seen for her cyst concerns tomorrow with a provider. She has appt with the nurse at 10:20am.

## 2020-11-12 ENCOUNTER — Ambulatory Visit: Payer: 59

## 2020-11-12 ENCOUNTER — Telehealth: Payer: Self-pay | Admitting: Clinical

## 2020-11-12 ENCOUNTER — Other Ambulatory Visit: Payer: Self-pay

## 2020-11-12 ENCOUNTER — Ambulatory Visit: Payer: 59 | Attending: Family Medicine

## 2020-11-12 DIAGNOSIS — F250 Schizoaffective disorder, bipolar type: Secondary | ICD-10-CM | POA: Diagnosis not present

## 2020-11-12 NOTE — Telephone Encounter (Signed)
I spoke with pt and scheduled appt for 12/01/20. She denied SI/HI. She mentioned that she is frustrated with psychiatrist changing her medications and does not want to see him anymore. Pt will keep her appt with psychiatrist on 11/15/20 and is requesting a referral out. [facility] will refer pt. Pt also mentioned that her previous therapist is no longer with [facility].

## 2020-11-12 NOTE — Telephone Encounter (Signed)
Cyst is GYN related and she is under the care of OBGYN for this. She needs to contact them.

## 2020-11-13 NOTE — Telephone Encounter (Signed)
Called pt made aware of MD message, Pt stated she does not remember the office nr . Office nr and Psychologist, counselling provided

## 2020-11-14 IMAGING — CR DG TIBIA/FIBULA 2V*R*
3 series · 3 of 3 positions shown · non-contrast
Comparison: November 27, 2016

CLINICAL DATA: Pain.

EXAM:
RIGHT TIBIA AND FIBULA - 2 VIEW

[x tib-fib lat right]
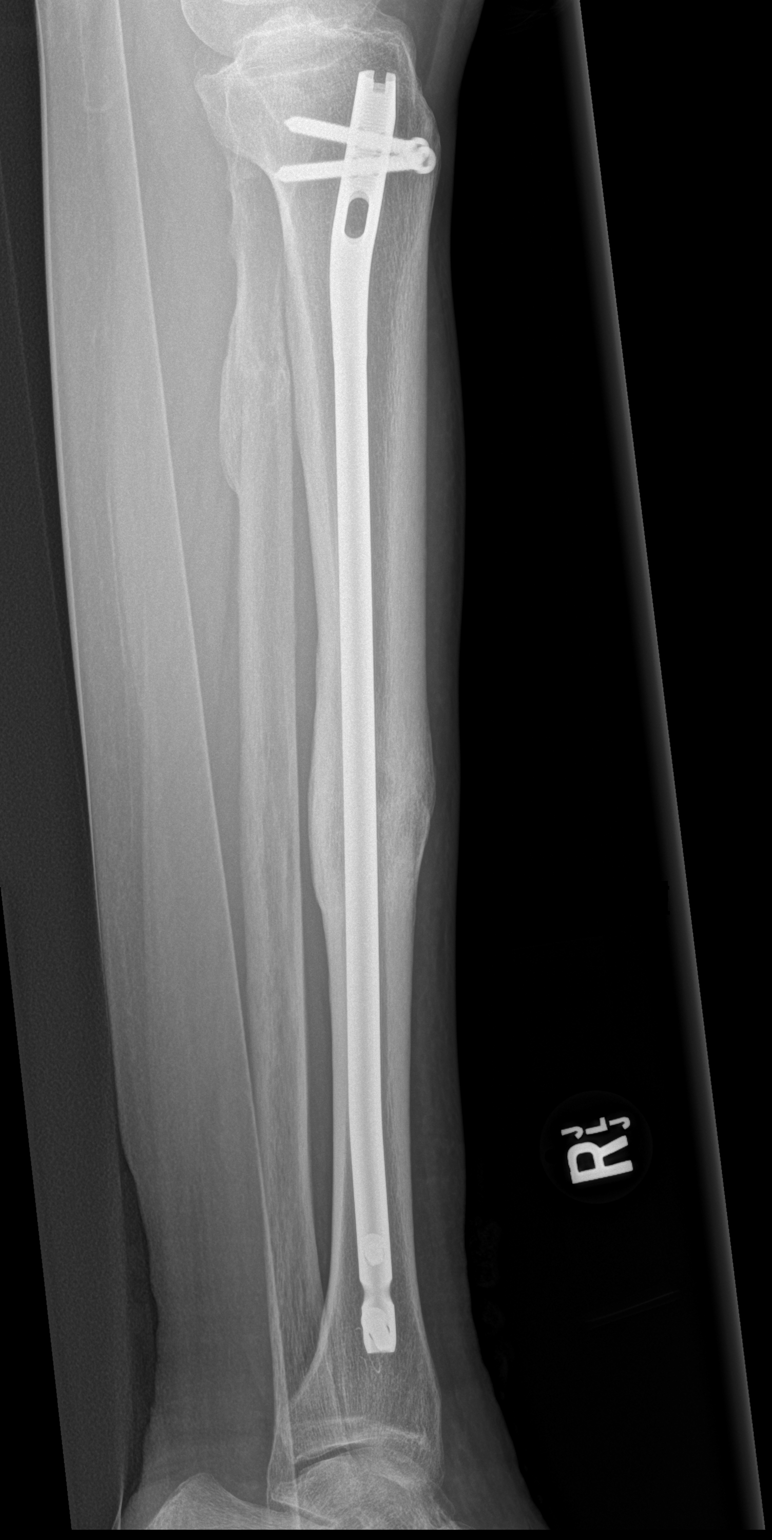

[x tib-fib ap right (1 of 2)]
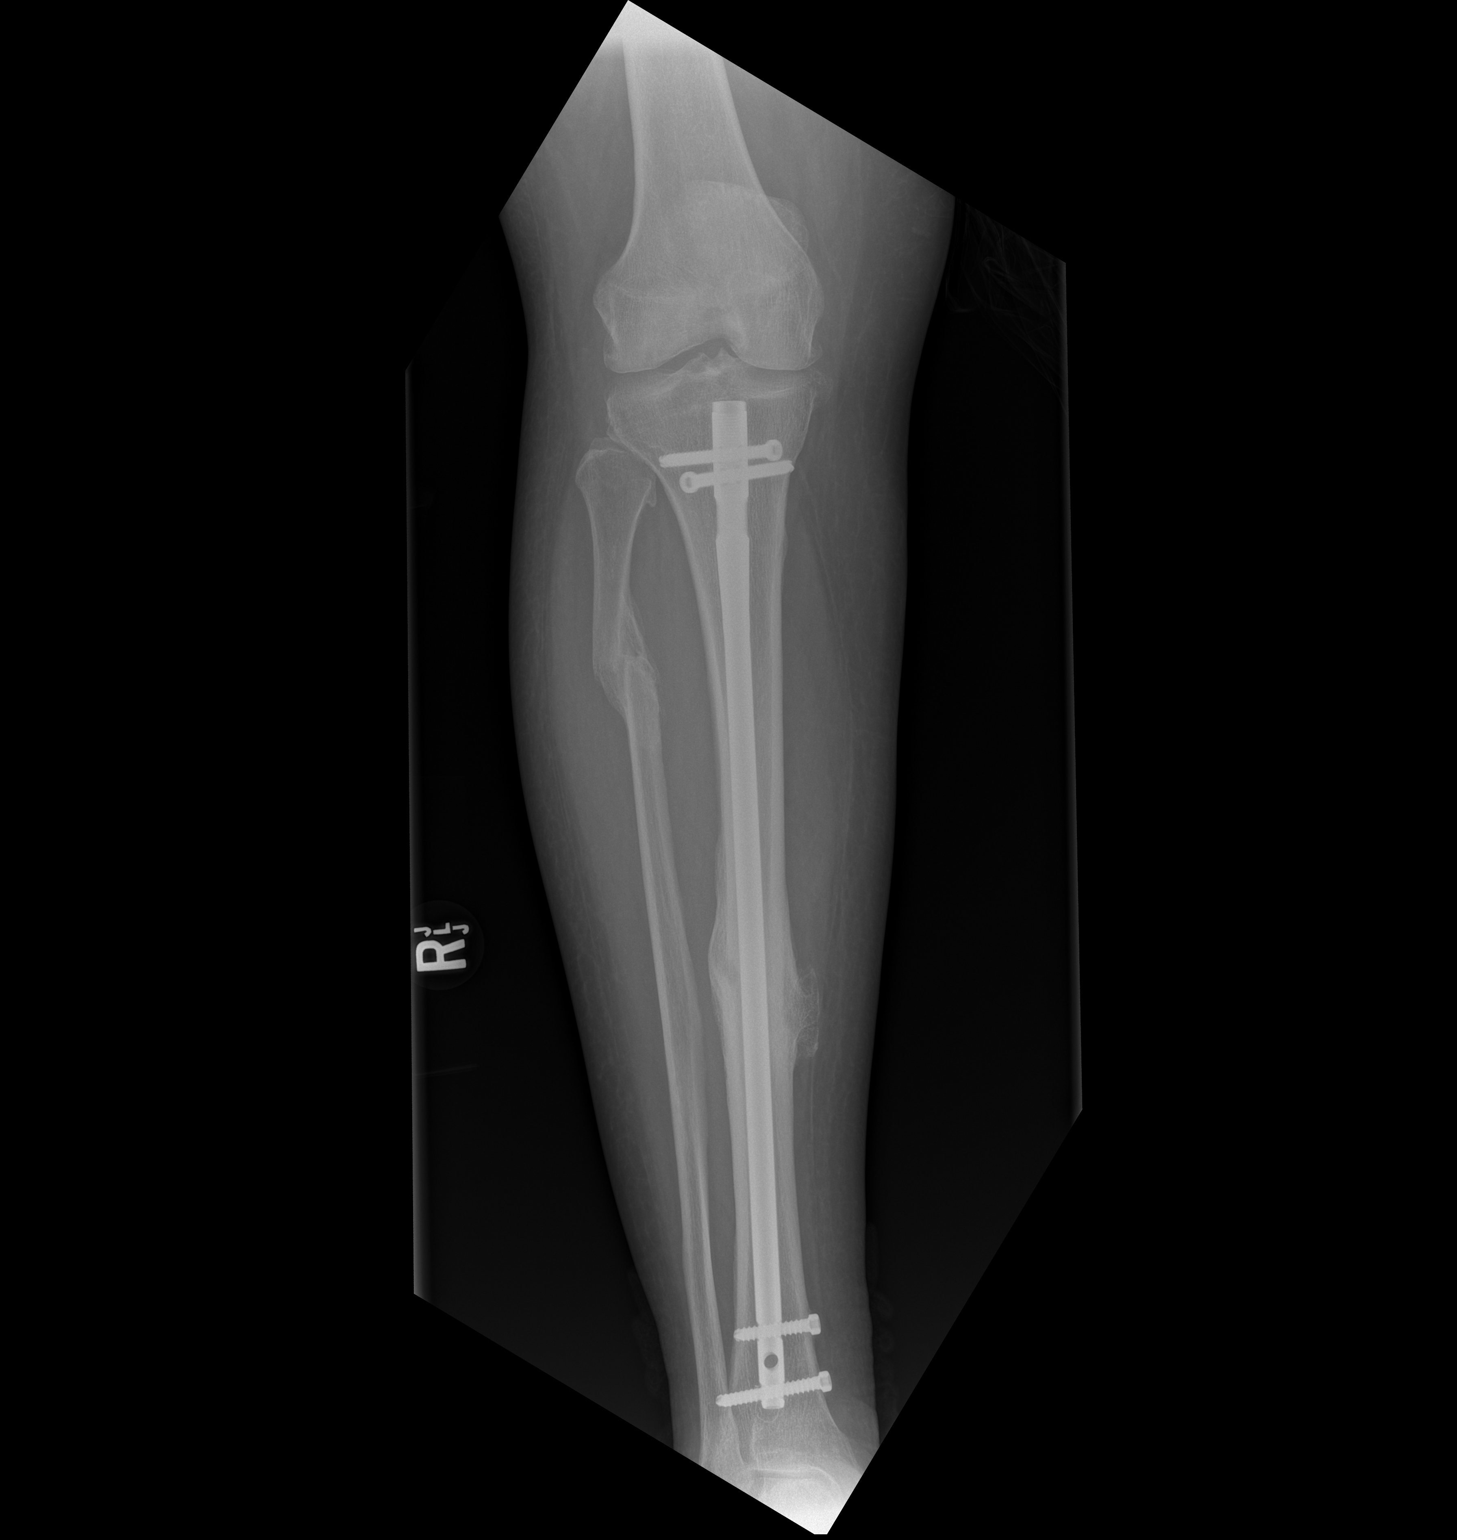

[x tib-fib ap right (2 of 2)]
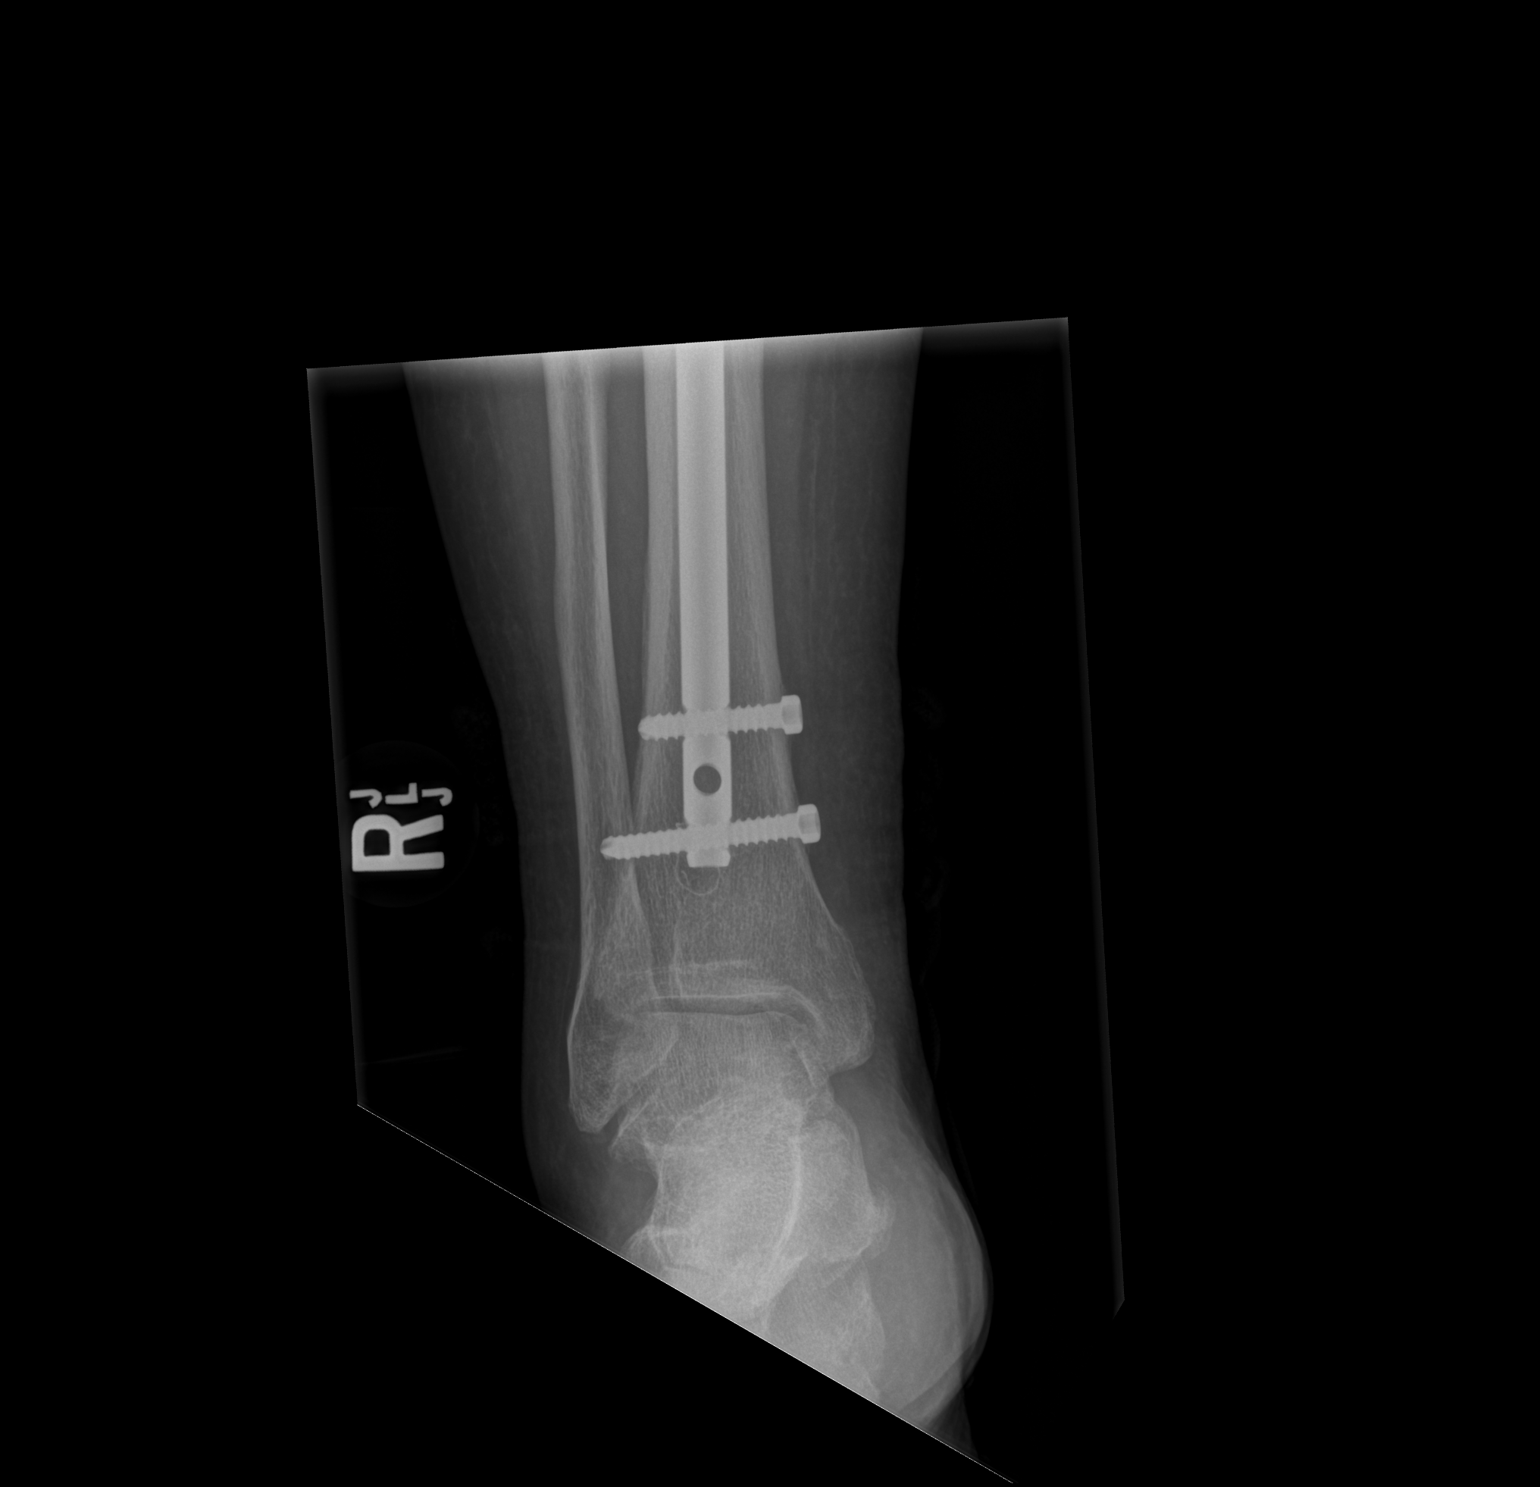

[3 of 3 positions shown; findings below may reference images not displayed]

FINDINGS: Healed proximal fibular fracture. Healed mid tibial fracture. An
intramedullary rod extends through the tibia with proximal and
distal interlocking screws. Degenerative changes in the knee. No
convincing evidence of fracture.
IMPRESSION: Healed fractures.  No acute fracture noted.
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
## 2020-11-15 ENCOUNTER — Telehealth (HOSPITAL_BASED_OUTPATIENT_CLINIC_OR_DEPARTMENT_OTHER): Payer: 59 | Admitting: Psychiatry

## 2020-11-15 ENCOUNTER — Other Ambulatory Visit: Payer: Self-pay | Admitting: Family Medicine

## 2020-11-15 ENCOUNTER — Encounter (HOSPITAL_COMMUNITY): Payer: Self-pay

## 2020-11-15 ENCOUNTER — Other Ambulatory Visit: Payer: Self-pay

## 2020-11-15 ENCOUNTER — Ambulatory Visit: Payer: Self-pay

## 2020-11-15 ENCOUNTER — Telehealth: Payer: Self-pay | Admitting: Family Medicine

## 2020-11-15 ENCOUNTER — Encounter (HOSPITAL_COMMUNITY): Payer: Self-pay | Admitting: Psychiatry

## 2020-11-15 ENCOUNTER — Emergency Department (HOSPITAL_COMMUNITY)
Admission: EM | Admit: 2020-11-15 | Discharge: 2020-11-15 | Disposition: A | Discharge status: Home / Self Care | Payer: 59 | Attending: Emergency Medicine | Admitting: Emergency Medicine

## 2020-11-15 DIAGNOSIS — J449 Chronic obstructive pulmonary disease, unspecified: Secondary | ICD-10-CM | POA: Diagnosis not present

## 2020-11-15 DIAGNOSIS — E1122 Type 2 diabetes mellitus with diabetic chronic kidney disease: Secondary | ICD-10-CM | POA: Insufficient documentation

## 2020-11-15 DIAGNOSIS — I10 Essential (primary) hypertension: Secondary | ICD-10-CM

## 2020-11-15 DIAGNOSIS — J45909 Unspecified asthma, uncomplicated: Secondary | ICD-10-CM | POA: Diagnosis not present

## 2020-11-15 DIAGNOSIS — M109 Gout, unspecified: Secondary | ICD-10-CM

## 2020-11-15 DIAGNOSIS — F250 Schizoaffective disorder, bipolar type: Secondary | ICD-10-CM

## 2020-11-15 DIAGNOSIS — I130 Hypertensive heart and chronic kidney disease with heart failure and stage 1 through stage 4 chronic kidney disease, or unspecified chronic kidney disease: Secondary | ICD-10-CM | POA: Diagnosis not present

## 2020-11-15 DIAGNOSIS — N183 Chronic kidney disease, stage 3 unspecified: Secondary | ICD-10-CM | POA: Diagnosis not present

## 2020-11-15 DIAGNOSIS — I5032 Chronic diastolic (congestive) heart failure: Secondary | ICD-10-CM | POA: Insufficient documentation

## 2020-11-15 DIAGNOSIS — Z87891 Personal history of nicotine dependence: Secondary | ICD-10-CM | POA: Diagnosis not present

## 2020-11-15 DIAGNOSIS — R197 Diarrhea, unspecified: Secondary | ICD-10-CM

## 2020-11-15 DIAGNOSIS — Z760 Encounter for issue of repeat prescription: Secondary | ICD-10-CM | POA: Diagnosis present

## 2020-11-15 DIAGNOSIS — Z79899 Other long term (current) drug therapy: Secondary | ICD-10-CM | POA: Diagnosis not present

## 2020-11-15 MED ORDER — DIAZEPAM 5 MG PO TABS: 5.0000 mg | ORAL_TABLET | Freq: Three times a day (TID) | ORAL | 1 refills | Status: DC | PRN

## 2020-11-15 MED ORDER — ESCITALOPRAM OXALATE 10 MG PO TABS: 10.0000 mg | ORAL_TABLET | Freq: Every day | ORAL | 0 refills | Status: DC

## 2020-11-15 MED ORDER — ASENAPINE MALEATE 5 MG SL SUBL: 10.0000 mg | SUBLINGUAL_TABLET | Freq: Two times a day (BID) | SUBLINGUAL | 0 refills | Status: DC

## 2020-11-15 NOTE — ED Triage Notes (Signed)
Per EMS-states she wants to get a refill on all her meds-also wants to see a lawyer-complaining of pain all over due to her gout

## 2020-11-15 NOTE — Telephone Encounter (Signed)
Requested medications are due for refill today.  yes  Requested medications are on the active medications list.  yes  Last refill. Voltaren 10/15/2020, Bentyl 10/07/2020, Uloric 10/15/2020  Future visit scheduled.   yes  Notes to clinic.  2 rx's signed by Dr. Lacretia Leigh.

## 2020-11-15 NOTE — Discharge Instructions (Signed)
Please follow-up with your pharmacy to refill the prescriptions that were sent in by your primary care provider  Please continue to take your prescribed medications as directed.

## 2020-11-15 NOTE — ED Provider Notes (Signed)
[place] [facility] DEPT Provider Note   CSN: [id number] Arrival date & time: 11/15/20  1104     History Chief Complaint  Patient presents with   Medication Refill    Tiffany [name] is a 60 y.o. female.  HPI Patient is a 60 year old female presented to the emergency room today with complaints of needing a med refill.  She states that she needs all of her medications refilled.  She states she does not have an up-to-date med list.  She states that specifically she would like to get a refill on her medications for gout  [name] not certain what medication she is taking.  She has not tried calling her primary care office to get a refill of the medications she is prescribed.  She denies any new symptoms today.  States that she came to the ER because she wants her medications refilled.     Past Medical History:  Diagnosis Date   Agitation 11/22/2017   Anoxic brain injury ([place]) 09/08/2016   C. Arrest due to respiratory failure and COPD exacerbation   Anxiety    Arthritis    "all over" (04/10/2016)   Asthma 10/18/2010   Binge eating disorder    Cardiac arrest ([place]) 09/08/2016   PEA   Carotid artery stenosis    1-39% bilateral by dopplers 11/2016   Chronic diastolic (congestive) heart failure ([place])    Chronic kidney disease    "I see a kidney dr." (04/10/2016)   Chronic pain syndrome 06/18/2012   Chronic post-traumatic stress disorder (PTSD) 05/27/2018   Chronic respiratory failure with hypoxia and hypercapnia ([place]) 06/22/2015   TRILOGY Vent >AVAPA-ES., Vt target 200-400, Max P 30 , PS max 20 , PS min 6-10 , E Max 6, E Min 4, Rate Auto AVAPS Rate 2 (titrate for pt comfort) , bleed O2 at 5l/m continuous flow .    CKD (chronic kidney disease) stage 3, GFR 30-59 ml/min (HCC) 12/15/2016   Closed displaced fracture of fifth metacarpal bone 03/21/2018   Cocaine use disorder, severe, in sustained remission ([place]) 41/06/6061   Complication of anesthesia    decreased bp,  decreased heart rate   COPD (chronic obstructive pulmonary disease) ([place]) 07/08/2014   Depression    Diabetic neuropathy ([place]) 04/24/2011   Difficulty with speech 01/24/2018   Disorder of nervous system    Drug abuse ([place]) 11/21/2017   Dyslipidemia 04/24/2011   Elevated troponin 04/28/2012   Emphysema    Encephalopathy 11/21/2017   Essential hypertension 03/22/2016   Fibula fracture 07/10/2016   Frequent falls 10/11/2017   GERD (gastroesophageal reflux disease)    Gout 04/11/2017   Heart attack ([place]) 1980s   History of blood transfusion 1994   "couldn't stop bleeding from my period"   History of drug abuse in remission ([place]) 11/28/2015   Quit in 2017   Hyperlipidemia LDL goal <70    Incontinence    Manic depression ([place])    Morbid obesity ([place]) 10/18/2010   Obstructive sleep apnea 10/18/2010   On home oxygen therapy    "6L; 24/7" (04/10/2016)   OSA on CPAP    "wear mask sometimes" (04/10/2016)   Paranoid ([place])    "sometimes; I'm on RX for it" (04/10/2016)   Prolonged Q-T interval on ECG    Rectal bleeding 12/31/2015   Right carotid bruit 11/09/2016   Schizoaffective disorder, bipolar type ([place]) 04/05/2018   Seasonal allergies    Seborrheic keratoses 12/31/2013   Seizures ([place])    "don't  know what kind; last one was ~ 1 yr ago" (04/10/2016)   Stroke [facility]) 1980s   denies residual on 04/10/2016   Thrush 09/19/2013   Type 2 diabetes mellitus ([place]) 10/18/2010    Patient Active Problem List   Diagnosis Date Noted   Protein-calorie malnutrition, severe 05/15/2020   Malnutrition of moderate degree 26/71/2458   Periumbilical abdominal pain    Dysphagia    Nausea vomiting and diarrhea    Generalized abdominal pain    E. coli UTI 09/98/3382   Acute metabolic encephalopathy 50/53/9767   Psychosis ([place]) 02/20/2020   Schizophrenia, schizo-affective ([place]) 34/19/3790   Metabolic acidosis, normal anion gap (NAG) 02/20/2020   Altered mental status    Hypocalcemia    AKI (acute kidney injury) ([place])  02/18/2020   Pelvic mass in female 01/02/2020   Genital herpes 11/25/2019   Stage 2 skin ulcer of sacral region ([place]) 10/28/2019   ARF (acute renal failure) ([place]) 10/27/2019   Family discord 02/04/2019   Aggressive behavior    PTSD (post-traumatic stress disorder) 05/27/2018   Schizoaffective disorder, bipolar type ([place]) 04/05/2018   Closed displaced fracture of fifth metacarpal bone 03/21/2018   Difficulty with speech 01/24/2018   Encephalopathy 11/21/2017   Drug abuse ([place]) 11/21/2017   Frequent falls 10/11/2017   Binge eating disorder    Dependence on continuous supplemental oxygen 05/14/2017   Gout 04/11/2017   Hypomagnesemia    CKD (chronic kidney disease) stage 3, GFR 30-59 ml/min (HCC) 12/15/2016   Carotid artery stenosis    Hypokalemia    Osteoarthritis 10/26/2016   Anoxic brain injury ([place]) 09/08/2016   Overactive bladder 06/07/2016   QT prolongation    OSA and COPD overlap syndrome ([place])    Arthritis    Essential hypertension 03/22/2016   Cocaine use disorder, severe, in sustained remission ([place]) 12/17/2015   History of drug abuse in remission ([place]) 11/28/2015   Hyponatremia 11/25/2015   Chronic diastolic congestive heart failure (HCC)    Chronic respiratory failure with hypoxia ([place]) 06/22/2015   Tobacco use disorder 07/22/2014   COPD (chronic obstructive pulmonary disease) ([place]) 07/08/2014   Seizure ([place]) 01/04/2013   Chronic pain syndrome 06/18/2012   Dyslipidemia 04/24/2011   Anemia 04/24/2011   Diabetic neuropathy ([place]) 04/24/2011   Morbid obesity ([place]) 10/18/2010   Type 2 diabetes mellitus ([place]) 10/18/2010    Past Surgical History:  Procedure Laterality Date   CESAREAN SECTION  1997   HERNIA REPAIR     IR CHOLANGIOGRAM EXISTING TUBE  07/20/2016   IR PERC CHOLECYSTOSTOMY  05/10/2016   IR RADIOLOGIST EVAL & MGMT  06/08/2016   IR RADIOLOGIST EVAL & MGMT  06/29/2016   IR SINUS/FIST TUBE CHK-NON GI  07/12/2016   RIGHT/LEFT HEART CATH AND CORONARY ANGIOGRAPHY  N/A 06/19/2017   Procedure: RIGHT/LEFT HEART CATH AND CORONARY ANGIOGRAPHY;  Surgeon: Jolaine Artist, MD;  Location: [name];  Service: Cardiovascular;  Laterality: N/A;   TIBIA IM NAIL INSERTION Right 07/12/2016   Procedure: INTRAMEDULLARY (IM) NAIL RIGHT TIBIA;  Surgeon: Leandrew Koyanagi, MD;  Location: [facility];  Service: Orthopedics;  Laterality: Right;   UMBILICAL HERNIA REPAIR  ~ 1963   "that's why I don't have a belly button"   VAGINAL HYSTERECTOMY       OB History     Gravida  4   Para  4   Term  3   Preterm  1   AB  0   Living  3  SAB  0   IAB  0   Ectopic  0   Multiple  0   Live Births  3           Family History  Problem Relation Age of Onset   Cancer Father        prostate   Cancer Mother        lung   Depression Mother    Depression Sister    Anxiety disorder Sister    Schizophrenia Sister    Bipolar disorder Sister    Depression Sister    Depression Brother    Heart failure Other        cousin    Social History   Tobacco Use   Smoking status: Former    Packs/day: 1.50    Years: 38.00    Pack years: 57.00    Types: Cigarettes    Start date: 03/13/1977    Quit date: 04/10/2016    Years since quitting: 4.6   Smokeless tobacco: Never  Vaping Use   Vaping Use: Never used  Substance Use Topics   Alcohol use: No    Alcohol/week: 0.0 standard drinks   Drug use: Not Currently    Types: Cocaine    Comment: 04/10/2016 "last used cocaine back in November 2017"    Home Medications Prior to Admission medications   Medication Sig Start Date End Date Taking? Authorizing Provider  albuterol (PROVENTIL) (2.5 MG/3ML) 0.083% nebulizer solution Take 3 mLs (2.5 mg total) by nebulization every 6 (six) hours as needed for wheezing or shortness of breath. 03/17/20   Collene Gobble, MD  albuterol (VENTOLIN HFA) 108 (90 Base) MCG/ACT inhaler Inhale 2 puffs into the lungs every 6 (six) hours as needed for wheezing or shortness of breath.  03/17/20   Collene Gobble, MD  amLODipine (NORVASC) 5 MG tablet Take 1 tablet (5 mg total) by mouth daily. 10/07/20 11/06/20  [name], MD  asenapine (SAPHRIS) 5 MG SUBL 24 hr tablet Place 2 tablets (10 mg total) under the tongue 2 (two) times daily. 11/15/20   Cobos, Myer Peer, MD  atorvastatin (LIPITOR) 20 MG tablet Take 20 mg by mouth daily. 09/27/20   [provider]  colchicine 0.6 MG tablet Take 2 tabs (1.2 mg) orally at the onset of gout flare, may repeat 1 tab (0.6 mg) 1 hour if symptoms persist 10/15/20   Lacretia Leigh, MD  diazepam (VALIUM) 5 MG tablet Take 1 tablet (5 mg total) by mouth every 8 (eight) hours as needed for anxiety. 11/15/20 01/14/21  Cobos, Myer Peer, MD  diclofenac (VOLTAREN) 75 MG EC tablet TAKE 1 TABLET(75 MG) BY MOUTH TWICE DAILY AS NEEDED Patient taking differently: Take 75 mg by mouth 2 (two) times daily as needed (for pain). 08/24/20   Hilts, Legrand Como, MD  diclofenac (VOLTAREN) 75 MG EC tablet Take 1 tablet (75 mg total) by mouth 2 (two) times daily as needed. 10/15/20   Lacretia Leigh, MD  diclofenac Sodium (VOLTAREN) 1 % GEL Apply 4 g topically 4 (four) times daily. 10/15/20   Lacretia Leigh, MD  dicyclomine (BENTYL) 10 MG capsule Take 1 capsule (10 mg total) by mouth 3 (three) times daily before meals. 10/07/20   [name], MD  escitalopram (LEXAPRO) 10 MG tablet Take 1 tablet (10 mg total) by mouth daily. 11/15/20 11/15/21  Cobos, Myer Peer, MD  febuxostat (ULORIC) 40 MG tablet Take 1 tablet (40 mg total) by mouth daily. 10/15/20   Lacretia Leigh, MD  feeding supplement (ENSURE ENLIVE / ENSURE PLUS) LIQD Take 237 mLs by mouth 2 (two) times daily between meals. 02/26/20   Hongalgi, Lenis Dickinson, MD  Fluticasone-Umeclidin-Vilant (TRELEGY ELLIPTA) 100-62.5-25 MCG/INH AEPB Inhale 1 puff into the lungs daily. 03/17/20   Collene Gobble, MD  furosemide (LASIX) 80 MG tablet Take 1 tablet (80 mg total) by mouth daily. Patient taking differently: Take 20 mg by  mouth daily. 06/07/20   [name], MD  gabapentin (NEURONTIN) 600 MG tablet Take 1 tablet (600 mg total) by mouth 2 (two) times daily. 10/15/20   Lacretia Leigh, MD  Iron, Ferrous Sulfate, 325 (65 Fe) MG TABS Take 325 mg by mouth daily. 10/08/20   [name], MD  ketoconazole (NIZORAL) 2 % cream Apply 1 application topically daily. 09/27/20   [provider]  lidocaine (LIDODERM) 5 % Place 1 patch onto the skin daily. Remove & Discard patch within 12 hours or as directed by MD 06/23/20   [name], MD  Misc. Devices [employer] Power wheelchair.  Diagnosis-frequent falls, bilateral knee osteoarthritis 05/31/20   [name], MD  Misc. Devices MISC Bedside Commode DX: frequent fall 07/30/20   [name], MD  montelukast (SINGULAIR) 10 MG tablet TAKE 1 TABLET(10 MG) BY MOUTH AT BEDTIME 10/07/20   [name], MD  Multiple Vitamin (MULTIVITAMIN WITH MINERALS) TABS tablet Take 1 tablet by mouth daily. 08/16/20   Mercy Riding, MD  omeprazole (PRILOSEC) 40 MG capsule TAKE 1 CAPSULE(40 MG) BY MOUTH DAILY 10/23/20   [name], MD  oxybutynin (DITROPAN) 5 MG tablet TAKE 1 TABLET(5 MG) BY MOUTH TWICE DAILY 10/23/20   [name], MD  potassium chloride SA (KLOR-CON) 20 MEQ tablet Take 2 tablets (40 mEq total) by mouth 2 (two) times daily. 07/14/20   [name], MD  terbinafine (LAMISIL AT) 1 % cream Apply 1 application topically 2 (two) times daily. 09/07/20   [name], MD  thiamine 100 MG tablet Take 1 tablet (100 mg total) by mouth daily. 02/27/20   Hongalgi, Lenis Dickinson, MD  tiZANidine (ZANAFLEX) 4 MG tablet Take 1 tablet (4 mg total) by mouth every 8 (eight) hours as needed for muscle spasms. 11/02/20   [name], MD  traZODone (DESYREL) 50 MG tablet Take 1 tablet (50 mg total) by mouth at bedtime. 10/18/20   Cobos, Myer Peer, MD    Allergies    Hydrocodone, Hydroxyzine, Latuda [lurasidone hcl], Magnesium-containing compounds, Prednisone, Tramadol, Codeine, Sulfa  antibiotics, and Tape  Review of Systems   Review of Systems  Constitutional:  Negative for fever.  HENT:  Negative for congestion.   Respiratory:  Negative for shortness of breath.   Cardiovascular:  Negative for chest pain.  Gastrointestinal:  Negative for abdominal distention.  Neurological:  Negative for dizziness and headaches.   Physical Exam Updated Vital Signs BP (!) 145/87 (BP Location: Left Arm)   Pulse 75   Temp 98 F (36.7 C) (Oral)   Resp 18   SpO2 97%   Physical Exam Vitals and nursing note reviewed.  Constitutional:      General: [name] not in acute distress.    Comments: Pleasant 60 year old female flat affect able answer questions without commands In no acute distress  HENT:     Head: Normocephalic and atraumatic.     Nose: Nose normal.  Eyes:     General: No scleral icterus. Cardiovascular:     Rate and Rhythm: Normal rate and regular rhythm.     Pulses:  Normal pulses.     Heart sounds: Normal heart sounds.  Pulmonary:     Effort: Pulmonary effort is normal. No respiratory distress.     Breath sounds: No wheezing.  Abdominal:     Palpations: Abdomen is soft.     Tenderness: There is no abdominal tenderness. There is no guarding or rebound.  Musculoskeletal:     Cervical back: Normal range of motion.     Right lower leg: No edema.     Left lower leg: No edema.  Skin:    General: Skin is warm and dry.     Capillary Refill: Capillary refill takes less than 2 seconds.  Neurological:     Mental Status: [name] alert. Mental status is at baseline.  Psychiatric:        Mood and Affect: Mood normal.        Behavior: Behavior normal.    ED Results / Procedures / Treatments   Labs (all labs ordered are listed, but only abnormal results are displayed) Labs Reviewed - No data to display  EKG None  Radiology No results found.  Procedures Procedures   Medications Ordered in ED Medications - No data to display  ED Course  I have reviewed the  triage vital signs and the nursing notes.  Pertinent labs & imaging results that were available during my care of the patient were reviewed by me and considered in my medical decision making (see chart for details).    MDM Rules/Calculators/A&P                          Patient here for medication refill.  [name] on extensive list of medications.  I recommended that she call her PCP.  She did not know the phone number.  I dialed the phone number for patient's PCP and we discussed with Magda Paganini who will make an urgent message and make sure that Dr.Newlin follow-up and prescribe medications before 6 PM.  Patient has no further questions at this time.  We will discharge patient home.  Final Clinical Impression(s) / ED Diagnoses Final diagnoses:  Medication refill    Rx / DC Orders ED Discharge Orders     None        Tedd Sias, [place] 11/15/20 1311    Blanchie Dessert, MD 11/15/20 1411

## 2020-11-15 NOTE — Telephone Encounter (Signed)
Patient called back about more refills. She says its 2 pages of, wasn't sure the names of them. Please call back

## 2020-11-15 NOTE — Telephone Encounter (Signed)
Prescriptions were provided at her 09/2020 visit with 6 refills each.  She needs to call her pharmacy for her refills as there are refills at the pharmacy for her.
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
## 2020-11-16 ENCOUNTER — Other Ambulatory Visit: Payer: Self-pay

## 2020-11-16 ENCOUNTER — Encounter (HOSPITAL_COMMUNITY): Payer: Self-pay | Admitting: Emergency Medicine

## 2020-11-16 ENCOUNTER — Encounter (HOSPITAL_COMMUNITY): Payer: Self-pay | Admitting: Psychiatry

## 2020-11-16 ENCOUNTER — Emergency Department (HOSPITAL_COMMUNITY)
Admission: EM | Admit: 2020-11-16 | Discharge: 2020-11-17 | Disposition: A | Discharge status: Home / Self Care | Payer: 59 | Attending: Emergency Medicine | Admitting: Emergency Medicine

## 2020-11-16 DIAGNOSIS — G629 Polyneuropathy, unspecified: Secondary | ICD-10-CM

## 2020-11-16 DIAGNOSIS — Z7952 Long term (current) use of systemic steroids: Secondary | ICD-10-CM | POA: Insufficient documentation

## 2020-11-16 DIAGNOSIS — E1122 Type 2 diabetes mellitus with diabetic chronic kidney disease: Secondary | ICD-10-CM | POA: Diagnosis not present

## 2020-11-16 DIAGNOSIS — R45850 Homicidal ideations: Secondary | ICD-10-CM | POA: Insufficient documentation

## 2020-11-16 DIAGNOSIS — Y900 Blood alcohol level of less than 20 mg/100 ml: Secondary | ICD-10-CM | POA: Insufficient documentation

## 2020-11-16 DIAGNOSIS — Z87891 Personal history of nicotine dependence: Secondary | ICD-10-CM | POA: Insufficient documentation

## 2020-11-16 DIAGNOSIS — I5032 Chronic diastolic (congestive) heart failure: Secondary | ICD-10-CM | POA: Insufficient documentation

## 2020-11-16 DIAGNOSIS — R45851 Suicidal ideations: Secondary | ICD-10-CM | POA: Diagnosis not present

## 2020-11-16 DIAGNOSIS — N183 Chronic kidney disease, stage 3 unspecified: Secondary | ICD-10-CM | POA: Diagnosis not present

## 2020-11-16 DIAGNOSIS — Z79899 Other long term (current) drug therapy: Secondary | ICD-10-CM | POA: Insufficient documentation

## 2020-11-16 DIAGNOSIS — J45909 Unspecified asthma, uncomplicated: Secondary | ICD-10-CM | POA: Insufficient documentation

## 2020-11-16 DIAGNOSIS — F250 Schizoaffective disorder, bipolar type: Secondary | ICD-10-CM | POA: Insufficient documentation

## 2020-11-16 DIAGNOSIS — Z046 Encounter for general psychiatric examination, requested by authority: Secondary | ICD-10-CM | POA: Diagnosis present

## 2020-11-16 DIAGNOSIS — J449 Chronic obstructive pulmonary disease, unspecified: Secondary | ICD-10-CM | POA: Insufficient documentation

## 2020-11-16 DIAGNOSIS — F329 Major depressive disorder, single episode, unspecified: Secondary | ICD-10-CM

## 2020-11-16 DIAGNOSIS — I130 Hypertensive heart and chronic kidney disease with heart failure and stage 1 through stage 4 chronic kidney disease, or unspecified chronic kidney disease: Secondary | ICD-10-CM | POA: Insufficient documentation

## 2020-11-16 LAB — URINALYSIS, ROUTINE W REFLEX MICROSCOPIC
Bacteria, UA: NONE SEEN
Bilirubin Urine: NEGATIVE
Glucose, UA: NEGATIVE mg/dL
Ketones, ur: NEGATIVE mg/dL
Nitrite: NEGATIVE
Protein, ur: NEGATIVE mg/dL
Specific Gravity, Urine: 1.005 (ref 1.005–1.030)
pH: 6 (ref 5.0–8.0)

## 2020-11-16 LAB — CBC
HCT: 29.3 % — ABNORMAL LOW (ref 36.0–46.0)
Hemoglobin: 9 g/dL — ABNORMAL LOW (ref 12.0–15.0)
MCH: 30.2 pg (ref 26.0–34.0)
MCHC: 30.7 g/dL (ref 30.0–36.0)
MCV: 98.3 fL (ref 80.0–100.0)
Platelets: 390 10*3/uL (ref 150–400)
RBC: 2.98 MIL/uL — ABNORMAL LOW (ref 3.87–5.11)
RDW: 16.6 % — ABNORMAL HIGH (ref 11.5–15.5)
WBC: 3.8 10*3/uL — ABNORMAL LOW (ref 4.0–10.5)
nRBC: 0 % (ref 0.0–0.2)

## 2020-11-16 LAB — SALICYLATE LEVEL: Salicylate Lvl: <7 mg/dL — ABNORMAL LOW (ref 7.0–30.0)

## 2020-11-16 LAB — COMPREHENSIVE METABOLIC PANEL
ALT: 36 U/L (ref 0–44)
AST: 46 U/L — ABNORMAL HIGH (ref 15–41)
Albumin: 3.2 g/dL — ABNORMAL LOW (ref 3.5–5.0)
Alkaline Phosphatase: 119 U/L (ref 38–126)
Anion gap: 8 (ref 5–15)
BUN: 10 mg/dL (ref 8–23)
CO2: 23 mmol/L (ref 22–32)
Calcium: 9 mg/dL (ref 8.9–10.3)
Chloride: 109 mmol/L (ref 98–111)
Creatinine, Ser: 1.54 mg/dL — ABNORMAL HIGH (ref 0.44–1.00)
GFR, Estimated: 38 mL/min — ABNORMAL LOW (ref >60)
Glucose, Bld: 86 mg/dL (ref 70–99)
Potassium: 3.8 mmol/L (ref 3.5–5.1)
Sodium: 140 mmol/L (ref 135–145)
Total Bilirubin: 0.3 mg/dL (ref 0.3–1.2)
Total Protein: 5.7 g/dL — ABNORMAL LOW (ref 6.5–8.1)

## 2020-11-16 LAB — BRAIN NATRIURETIC PEPTIDE: B Natriuretic Peptide: 107.7 pg/mL — ABNORMAL HIGH (ref 0.0–100.0)

## 2020-11-16 LAB — ETHANOL: Alcohol, Ethyl (B): <10 mg/dL (ref <10)

## 2020-11-16 LAB — ACETAMINOPHEN LEVEL: Acetaminophen (Tylenol), Serum: <10 ug/mL — ABNORMAL LOW (ref 10–30)

## 2020-11-16 MED ORDER — AMLODIPINE BESYLATE 5 MG PO TABS
5.0000 mg | ORAL_TABLET | Freq: Once | ORAL | Status: AC
  Administered 2020-11-16: 5 mg via ORAL
  Filled 2020-11-16: qty 1

## 2020-11-16 MED ORDER — AMLODIPINE BESYLATE 5 MG PO TABS
5.0000 mg | ORAL_TABLET | Freq: Every day | ORAL | Status: DC
  Administered 2020-11-17: 5 mg via ORAL
  Filled 2020-11-16: qty 1

## 2020-11-16 MED ORDER — TRAZODONE HCL 50 MG PO TABS
50.0000 mg | ORAL_TABLET | Freq: Every day | ORAL | Status: DC
  Filled 2020-11-16: qty 1

## 2020-11-16 MED ORDER — LORAZEPAM 2 MG/ML IJ SOLN
1.0000 mg | Freq: Once | INTRAMUSCULAR | Status: AC
  Administered 2020-11-16: 1 mg via INTRAMUSCULAR
  Filled 2020-11-16: qty 1

## 2020-11-16 MED ORDER — FLUTICASONE-UMECLIDIN-VILANT 100-62.5-25 MCG/ACT IN AEPB: 1.0000 | INHALATION_SPRAY | Freq: Every day | RESPIRATORY_TRACT | Status: DC

## 2020-11-16 MED ORDER — DIAZEPAM 5 MG PO TABS: 5.0000 mg | ORAL_TABLET | Freq: Three times a day (TID) | ORAL | Status: DC | PRN

## 2020-11-16 MED ORDER — ASENAPINE MALEATE 5 MG SL SUBL
10.0000 mg | SUBLINGUAL_TABLET | Freq: Two times a day (BID) | SUBLINGUAL | Status: DC
  Administered 2020-11-17 (×2): 10 mg via SUBLINGUAL
  Filled 2020-11-16 (×4): qty 2

## 2020-11-16 MED ORDER — OXYBUTYNIN CHLORIDE 5 MG PO TABS
5.0000 mg | ORAL_TABLET | Freq: Two times a day (BID) | ORAL | Status: DC
  Administered 2020-11-16 – 2020-11-17 (×2): 5 mg via ORAL
  Filled 2020-11-16: qty 1

## 2020-11-16 MED ORDER — ESCITALOPRAM OXALATE 10 MG PO TABS
10.0000 mg | ORAL_TABLET | Freq: Every day | ORAL | Status: DC
  Administered 2020-11-16 – 2020-11-17 (×2): 10 mg via ORAL
  Filled 2020-11-16 (×2): qty 1

## 2020-11-16 MED ORDER — FEBUXOSTAT 40 MG PO TABS
40.0000 mg | ORAL_TABLET | Freq: Every day | ORAL | Status: DC
  Administered 2020-11-17 (×2): 40 mg via ORAL
  Filled 2020-11-16 (×3): qty 1

## 2020-11-16 MED ORDER — ACETAMINOPHEN 325 MG PO TABS
650.0000 mg | ORAL_TABLET | ORAL | Status: DC | PRN
  Filled 2020-11-16: qty 2

## 2020-11-16 MED ORDER — FUROSEMIDE 20 MG PO TABS
80.0000 mg | ORAL_TABLET | Freq: Every day | ORAL | Status: DC
  Administered 2020-11-16 – 2020-11-17 (×2): 80 mg via ORAL
  Filled 2020-11-16 (×2): qty 4

## 2020-11-16 MED ORDER — ATORVASTATIN CALCIUM 10 MG PO TABS
20.0000 mg | ORAL_TABLET | Freq: Every day | ORAL | Status: DC
  Administered 2020-11-16 – 2020-11-17 (×2): 20 mg via ORAL
  Filled 2020-11-16 (×2): qty 2

## 2020-11-16 MED ORDER — DICLOFENAC SODIUM 75 MG PO TBEC: 75.0000 mg | DELAYED_RELEASE_TABLET | Freq: Two times a day (BID) | ORAL | 0 refills | Status: DC | PRN

## 2020-11-16 MED ORDER — UMECLIDINIUM BROMIDE 62.5 MCG/ACT IN AEPB
1.0000 | INHALATION_SPRAY | Freq: Every day | RESPIRATORY_TRACT | Status: DC
  Administered 2020-11-17: 1 via RESPIRATORY_TRACT
  Filled 2020-11-16: qty 7

## 2020-11-16 MED ORDER — PANTOPRAZOLE SODIUM 40 MG PO TBEC
40.0000 mg | DELAYED_RELEASE_TABLET | Freq: Every day | ORAL | Status: DC
  Administered 2020-11-16 – 2020-11-17 (×2): 40 mg via ORAL
  Filled 2020-11-16 (×2): qty 1

## 2020-11-16 MED ORDER — GABAPENTIN 600 MG PO TABS
600.0000 mg | ORAL_TABLET | Freq: Two times a day (BID) | ORAL | Status: DC
  Administered 2020-11-17: 600 mg via ORAL
  Filled 2020-11-16 (×2): qty 1

## 2020-11-16 MED ORDER — DICYCLOMINE HCL 10 MG PO CAPS
10.0000 mg | ORAL_CAPSULE | Freq: Three times a day (TID) | ORAL | Status: DC
  Administered 2020-11-17: 10 mg via ORAL
  Filled 2020-11-16: qty 1

## 2020-11-16 MED ORDER — FLUTICASONE FUROATE-VILANTEROL 100-25 MCG/ACT IN AEPB
1.0000 | INHALATION_SPRAY | Freq: Every day | RESPIRATORY_TRACT | Status: DC
  Administered 2020-11-17: 1 via RESPIRATORY_TRACT
  Filled 2020-11-16: qty 28

## 2020-11-16 NOTE — ED Triage Notes (Signed)
Patient here with suicidal and homicidal ideations. Patient was picked up at home.  Patient is able to walk with assistance with her personal walker. Patient is apathetic, thinks people will steal her belongings.

## 2020-11-16 NOTE — ED Notes (Signed)
Pt remains in room 47 for TTS, sitter remains with pt

## 2020-11-16 NOTE — Telephone Encounter (Signed)
Called pt unable to reach pr leave VM , call keep on ringing
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
## 2020-11-16 NOTE — ED Notes (Signed)
Pt arrived to [facility] with 6 bags, bags currently behind nurses' desk. No staff to inventory at this time. Pt has other belongings that are with security, yellow ticket with pt's papers. Pt has a walker with belongings, pt label placed on walker which is also behind nurses' desk

## 2020-11-16 NOTE — BH Assessment (Addendum)
@  1503, TTS Clinician requested nursing (Zoe, RN) to  set up the TTS machine for patient to be assessed. Per nursing, patient is in the hallway and speaking with a Education officer, museum. Nursing will let TTS know when social worker leaves and see if we want me to move her to a room at that time. Clinician asked nursing to provide updates of when patient is ready to be seen.
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
## 2020-11-16 NOTE — ED Notes (Signed)
Pt cooperative during vitals.

## 2020-11-16 NOTE — ED Notes (Signed)
Pt changed into scrubs, valuables locked up with security

## 2020-11-16 NOTE — ED Notes (Addendum)
Pt in room 47 for TTS, sitter with pt

## 2020-11-16 NOTE — ED Provider Notes (Addendum)
Care of patient assumed from [name].  Agree with history, physical exam and plan.  See their note for further details. Briefly, 60 year old female with history of schizoaffective disorder and bipolar.  Presents to the ED with chief complaint of suicidal and homicidal ideations.  Patient is suicidal with active plan for suicide.    Med clearance labs ordered by previous provider.  Urinalysis and UDS pending at this time.  Informed RN that patient would need to be IVC if she attempted to leave the hospital.  UA shows no signs of infection.  Home medications ordered.  Per Earleen Newport, NP, patient to remain in the ED for continuous observation. Pending am psych evaluation.  During med reconciliation pharmacy tech reports that patient is requesting Vyvanse and prazosin.  Per chart review patient had both of these medications discontinued by her psychiatrist.   Loni Beckwith, PA-C 11/16/20 2050    Loni Beckwith, PA-C 11/16/20 2155    Godfrey Pick, MD 11/17/20 [phone]

## 2020-11-17 DIAGNOSIS — Z79899 Other long term (current) drug therapy: Secondary | ICD-10-CM

## 2020-11-17 DIAGNOSIS — F250 Schizoaffective disorder, bipolar type: Secondary | ICD-10-CM | POA: Diagnosis not present

## 2020-11-17 LAB — RAPID URINE DRUG SCREEN, HOSP PERFORMED
Amphetamines: NOT DETECTED
Barbiturates: NOT DETECTED
Benzodiazepines: POSITIVE — AB
Cocaine: NOT DETECTED
Opiates: NOT DETECTED
Tetrahydrocannabinol: NOT DETECTED

## 2020-11-17 MED ORDER — ASENAPINE MALEATE 5 MG SL SUBL: 10.0000 mg | SUBLINGUAL_TABLET | Freq: Two times a day (BID) | SUBLINGUAL | 0 refills | Status: DC

## 2020-11-17 MED ORDER — GABAPENTIN 300 MG PO CAPS
600.0000 mg | ORAL_CAPSULE | Freq: Two times a day (BID) | ORAL | Status: DC
  Administered 2020-11-17: 600 mg via ORAL
  Filled 2020-11-17: qty 2

## 2020-11-17 NOTE — ED Notes (Signed)
Pt still sleeping at this time. Will continue to monitor. VS stable. Safety precautions maintained. Sitter 1:1 at bedside.

## 2020-11-17 NOTE — Progress Notes (Signed)
Pt washed up in the bathroom after TTS call groomed herself well just needed help getting to and from the wheelchair.

## 2020-11-17 NOTE — ED Notes (Signed)
Pt is very drowsy. Aroused a few times by staff. Pt is responsive to name. Pt acknowledges staff then goes back to sleep. VS stable. Will continue to monitor. Sitter at bedside with 1:1 continued.

## 2020-11-17 NOTE — ED Notes (Signed)
Pt locked belongings including money and tasers given back to pt. Pt walker and belongings returned to pt. Pt was provided with taxi voucher to go home. Cleared for discharge.

## 2020-11-17 NOTE — ED Notes (Signed)
Pt is talking to TTS at this time  

## 2020-11-17 NOTE — Progress Notes (Signed)
Pt bed linen was wet patient was taken to the restroom where she was changed into clean dry scrubs and linen was also changed and she went back to sleep fast

## 2020-11-17 NOTE — ED Notes (Signed)
Pt is now awake and oriented. Ate breakfast and pt has been pleasant to staff. Pt states she does not feel safe going home. Told RN that she recently had med changes and she feels like she has to get better first before going home. Took meds with no complaints. Safety precautions maintained with 1:1 sitter at bedside. Will continue to monitor.

## 2020-11-17 NOTE — ED Notes (Signed)
Breakfast Orders placed 

## 2020-11-17 NOTE — Consult Note (Addendum)
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 informing:  Psychiatric consult completed and psychiatrically cleared.  Patient to follow up with current psychiatric provider.  Patient will need prescription for Lexapro and Saphris until can see current provider.  Please inform MD only default listed.    Aleczander Fandino, NP 11/17/2020 6:17 PM

## 2020-11-17 NOTE — Progress Notes (Signed)
Pt has been asking for different things I have been getting them for her.I tried to reason with her on some things but  whenever she cannot get her way she goes off told me that I was no good she knows more than me.Earlier part of the day she was ok she started about maybe 3:30 or 4pm Ask for things often unless she is sleep.Cursing saying I ain't nothing You ain't no nurse

## 2020-11-17 NOTE — ED Provider Notes (Signed)
Emergency Medicine Observation Re-evaluation Note  Tiffany [name] is a 60 y.o. female, seen on rounds today.  Pt initially presented to the ED for complaints of Suicidal Currently, the patient is sleeping.  Physical Exam  BP (!) 172/105 (BP Location: Left Arm)   Pulse 78   Temp 98.2 F (36.8 C) (Oral)   Resp 15   SpO2 98%  Physical Exam General: nad   Cardiac: rrr Lungs: no resp distress Psych: sleeping and unable to assess  ED Course / MDM  EKG:   I have reviewed the labs performed to date as well as medications administered while in observation.  Recent changes in the last 24 hours include was evaluated by tts and held for observation.  Still 1:1 sitter.  Plan  Current plan is for am psych eval.  Tiffany [name] is not under involuntary commitment.     Tiffany Dessert, MD 11/17/20 [phone]

## 2020-11-17 NOTE — ED Notes (Signed)
Tiffany [name] is coming at [facility] in ED entrance. Pt sent out with sitter to waiting room.

## 2020-11-18 ENCOUNTER — Ambulatory Visit: Payer: 59 | Admitting: Physician Assistant

## 2020-11-18 ENCOUNTER — Other Ambulatory Visit: Payer: Self-pay

## 2020-11-18 ENCOUNTER — Encounter: Payer: Self-pay | Admitting: Physician Assistant

## 2020-11-18 ENCOUNTER — Ambulatory Visit: Payer: 59 | Attending: Physician Assistant | Admitting: Physician Assistant

## 2020-11-18 ENCOUNTER — Ambulatory Visit: Payer: Self-pay | Admitting: *Deleted

## 2020-11-18 VITALS — BP 148/98 | HR 97 | Temp 98.8°F | Resp 18 | Ht 62.0 in | Wt 128.0 lb

## 2020-11-18 DIAGNOSIS — R600 Localized edema: Secondary | ICD-10-CM | POA: Diagnosis not present

## 2020-11-18 DIAGNOSIS — M79671 Pain in right foot: Secondary | ICD-10-CM

## 2020-11-18 DIAGNOSIS — G629 Polyneuropathy, unspecified: Secondary | ICD-10-CM | POA: Insufficient documentation

## 2020-11-18 DIAGNOSIS — M79672 Pain in left foot: Secondary | ICD-10-CM | POA: Insufficient documentation

## 2020-11-18 NOTE — Telephone Encounter (Signed)
Pain in feet- feel her legs/feet have limited circulation. Stays cold all the time. Patient is using walker. Needs to talk to social worker about therapy.   Reason for Disposition  [1] SEVERE pain (e.g., excruciating, unable to do any normal activities) AND [2] not improved after 2 hours of pain medicine  Answer Assessment - Initial Assessment Questions 1. ONSET: "When did the pain start?"      ongoing 2. LOCATION: "Where is the pain located?"      Both legs and feet- discoloration in both legs 3. PAIN: "How bad is the pain?"    (Scale 1-10; or mild, moderate, severe)   -  MILD (1-3): doesn't interfere with normal activities    -  MODERATE (4-7): interferes with normal activities (e.g., work or school) or awakens from sleep, limping    -  SEVERE (8-10): excruciating pain, unable to do any normal activities, unable to walk     severe 4. WORK OR EXERCISE: "Has there been any recent work or exercise that involved this part of the body?"      no 5. CAUSE: "What do you think is causing the leg pain?"     Circulation problems 6. OTHER SYMPTOMS: "Do you have any other symptoms?" (e.g., chest pain, back pain, breathing difficulty, swelling, rash, fever, numbness, weakness)     Thumb pain-R worse 7. PREGNANCY: "Is there any chance you are pregnant?" "When was your last menstrual period?"     na  Protocols used: Leg Pain-A-AH

## 2020-11-18 NOTE — Progress Notes (Signed)
Established Patient Office Visit  Subjective:  Patient ID: Tiffany [name], female    DOB: 1960-03-14  Age: 60 y.o. MRN: [id number]  CC:  Chief Complaint  Patient presents with   Leg Pain    HPI Tiffany [name] reports that she continues to have bilateral foot pain.  States that she was seen by her PCP on 10/07/20 for same complaint.  Note from that visit:   Interval History: She presents to today's visit accompanied by her mother complaining of feet pain. Her feet recently started hurting with associated throbbing, burning, itching, has been uncomfortable and symptoms have been going on for months per patient. At home she does not wear shoes but allows her feet to get here.  At her last visit I had prescribed her Lamisil for tinea pedis. [name] also on gabapentin for neuropathy but states symptoms are not controlled on gabapentin.  [name] also concerned about curling of the toes of her left foot which she noticed recently. She has chronic pain in her legs and has chronic pedal edema.  She informs me she elevates her legs while sitting.  Med list reveals she is on Lasix.  2. Neuropathy Uncontrolled Would love to place on Cymbalta however she is already on Lexapro I have sent a message to her psychiatrist to assess possibly switching from Lexapro to Cymbalta due to added analgesic benefit of Cymbalta - gabapentin (NEURONTIN) 600 MG tablet; Take 1 tablet (600 mg total) by mouth 2 (two) times daily.  Dispense: 60 tablet; Refill: 6     4. Tinea pedis of both feet Uncontrolled She continues to use topical antifungal Advised to wear open shoes at home to allow proper aeration of feet   5. Pain in both feet Chronic-uncontrolled Superimposed pedal edema Due to deformity of feet with recent currently I have referred her to podiatry especially in the light of her pain Encouraged to comply with a low-sodium diet, elevate feet, use compression stockings - Ambulatory referral to  Podiatry  States that she did see podiatry, on October 14, 2020.  Note from that visit:   60 y.o. female presents for bilateral foot pain that has been present for several one year. States the pain is throbbing burning and itchy in nature. Also has a history of neuropathy for which she takes gabapentin. Relates swelling in both of her legs as well . States she has pain all over her feet and up her legs.  Denies any other pedal complaints. Denies n/v/f/c.  Assessment:    1. Hammertoe, bilateral   2. Bunion   3. Diabetic polyneuropathy associated with type 2 diabetes mellitus ([place])         Plan:  Patient was evaluated and treated and all questions answered. X-rays reviewed and discussed with patient. No acute fractures or dislocations. Mild osteopenic changes noted throughout bilateral feet. Mild spurring noted to plantar calcaneus bilateral with pes planus foot type noted bilateral. Noted hammered digits 2-5 bilateral and increased IM 1-2 angle bilateral.  -Discussed HAV, hammertoe and neuropathy treatment options;conservative and surgical management; risks, benefits, alternatives discussed. All patient's questions answered. -Discussed padding and wide shoe gear.   -Recommend continue with good supportive shoes and inserts.  -Prescription for DM shoes provided.  -Discussed compression stockings for swelling and prescription provided.   -Patient to return to office as needed or sooner if condition worsens.    States today that she has tried using the padding and supportive shoes without much relief.  States that she has noticed some swelling in her ankles and lower legs.  Reports that she has not been using compression stockings as prescribed.      Past Medical History:  Diagnosis Date   Agitation 11/22/2017   Anoxic brain injury ([place]) 09/08/2016   C. Arrest due to respiratory failure and COPD exacerbation   Anxiety    Arthritis    "all over" (04/10/2016)   Asthma 10/18/2010    Binge eating disorder    Cardiac arrest ([place]) 09/08/2016   PEA   Carotid artery stenosis    1-39% bilateral by dopplers 11/2016   Chronic diastolic (congestive) heart failure ([place])    Chronic kidney disease    "I see a kidney dr." (04/10/2016)   Chronic pain syndrome 06/18/2012   Chronic post-traumatic stress disorder (PTSD) 05/27/2018   Chronic respiratory failure with hypoxia and hypercapnia ([place]) 06/22/2015   TRILOGY Vent >AVAPA-ES., Vt target 200-400, Max P 30 , PS max 20 , PS min 6-10 , E Max 6, E Min 4, Rate Auto AVAPS Rate 2 (titrate for pt comfort) , bleed O2 at 5l/m continuous flow .    CKD (chronic kidney disease) stage 3, GFR 30-59 ml/min (HCC) 12/15/2016   Closed displaced fracture of fifth metacarpal bone 03/21/2018   Cocaine use disorder, severe, in sustained remission ([place]) 12/23/7865   Complication of anesthesia    decreased bp, decreased heart rate   COPD (chronic obstructive pulmonary disease) ([place]) 07/08/2014   Depression    Diabetic neuropathy ([place]) 04/24/2011   Difficulty with speech 01/24/2018   Disorder of nervous system    Drug abuse ([place]) 11/21/2017   Dyslipidemia 04/24/2011   Elevated troponin 04/28/2012   Emphysema    Encephalopathy 11/21/2017   Essential hypertension 03/22/2016   Fibula fracture 07/10/2016   Frequent falls 10/11/2017   GERD (gastroesophageal reflux disease)    Gout 04/11/2017   Heart attack ([place]) 1980s   History of blood transfusion 1994   "couldn't stop bleeding from my period"   History of drug abuse in remission ([place]) 11/28/2015   Quit in 2017   Hyperlipidemia LDL goal <70    Incontinence    Manic depression ([place])    Morbid obesity ([place]) 10/18/2010   Obstructive sleep apnea 10/18/2010   On home oxygen therapy    "6L; 24/7" (04/10/2016)   OSA on CPAP    "wear mask sometimes" (04/10/2016)   Paranoid ([place])    "sometimes; I'm on RX for it" (04/10/2016)   Prolonged Q-T interval on ECG    Rectal bleeding 12/31/2015   Right carotid bruit 11/09/2016    Schizoaffective disorder, bipolar type ([place]) 04/05/2018   Seasonal allergies    Seborrheic keratoses 12/31/2013   Seizures ([place])    "don't know what kind; last one was ~ 1 yr ago" (04/10/2016)   Stroke [facility]) 1980s   denies residual on 04/10/2016   Thrush 09/19/2013   Type 2 diabetes mellitus ([place]) 10/18/2010    Past Surgical History:  Procedure Laterality Date   CESAREAN SECTION  1997   HERNIA REPAIR     IR CHOLANGIOGRAM EXISTING TUBE  07/20/2016   IR PERC CHOLECYSTOSTOMY  05/10/2016   IR RADIOLOGIST EVAL & MGMT  06/08/2016   IR RADIOLOGIST EVAL & MGMT  06/29/2016   IR SINUS/FIST TUBE CHK-NON GI  07/12/2016   RIGHT/LEFT HEART CATH AND CORONARY ANGIOGRAPHY N/A 06/19/2017   Procedure: RIGHT/LEFT HEART CATH AND CORONARY ANGIOGRAPHY;  Surgeon: Jolaine Artist, MD;  Location: [facility]  CV [facility];  Service: Cardiovascular;  Laterality: N/A;   TIBIA IM NAIL INSERTION Right 07/12/2016   Procedure: INTRAMEDULLARY (IM) NAIL RIGHT TIBIA;  Surgeon: Leandrew Koyanagi, MD;  Location: [facility];  Service: Orthopedics;  Laterality: Right;   UMBILICAL HERNIA REPAIR  ~ 1963   "that's why I don't have a belly button"   VAGINAL HYSTERECTOMY      Family History  Problem Relation Age of Onset   Cancer Father        prostate   Cancer Mother        lung   Depression Mother    Depression Sister    Anxiety disorder Sister    Schizophrenia Sister    Bipolar disorder Sister    Depression Sister    Depression Brother    Heart failure Other        cousin    Social History   Socioeconomic History   Marital status: Widowed    Spouse name: Not on file   Number of children: 3   Years of education: Not on file   Highest education level: Not on file  Occupational History   Occupation: disabled    Comment: factory production  Tobacco Use   Smoking status: Former    Packs/day: 1.50    Years: 38.00    Pack years: 57.00    Types: Cigarettes    Start date: 03/13/1977    Quit date: 04/10/2016    Years since  quitting: 4.6   Smokeless tobacco: Never  Vaping Use   Vaping Use: Never used  Substance and Sexual Activity   Alcohol use: No    Alcohol/week: 0.0 standard drinks   Drug use: Not Currently    Types: Cocaine    Comment: 04/10/2016 "last used cocaine back in November 2017"   Sexual activity: Not Currently    Birth control/protection: Surgical  Other Topics Concern   Not on file  Social History Narrative   Has 1 son, [name]   Lives with son and his boyfriend   Her house has ramps and handrails should she ever needs them.    Her mother lives down the street from her and is a good support person in addition to her son.   She drives herself, has private transportation.    Cocaine free since 02/24/16, smoke free since 04/10/16   Social Determinants of Health   Financial Resource Strain: Not on file  Food Insecurity: Not on file  Transportation Needs: Not on file  Physical Activity: Not on file  Stress: Not on file  Social Connections: Not on file  Intimate Partner Violence: Not on file    Outpatient Medications Prior to Visit  Medication Sig Dispense Refill   albuterol (PROVENTIL) (2.5 MG/3ML) 0.083% nebulizer solution Take 3 mLs (2.5 mg total) by nebulization every 6 (six) hours as needed for wheezing or shortness of breath. 360 mL 5   albuterol (VENTOLIN HFA) 108 (90 Base) MCG/ACT inhaler Inhale 2 puffs into the lungs every 6 (six) hours as needed for wheezing or shortness of breath. 18 g 5   asenapine (SAPHRIS) 5 MG SUBL 24 hr tablet Place 2 tablets (10 mg total) under the tongue 2 (two) times daily. 60 tablet 0   atorvastatin (LIPITOR) 20 MG tablet Take 20 mg by mouth daily.     colchicine 0.6 MG tablet Take 2 tabs (1.2 mg) orally at the onset of gout flare, may repeat 1 tab (0.6 mg) 1 hour if symptoms persist 30 tablet  6   cyclobenzaprine (FLEXERIL) 10 MG tablet Take 10 mg by mouth 2 (two) times daily as needed.     diazepam (VALIUM) 5 MG tablet Take 1 tablet (5 mg total) by mouth  every 8 (eight) hours as needed for anxiety. 8 tablet 1   diclofenac (VOLTAREN) 75 MG EC tablet TAKE 1 TABLET(75 MG) BY MOUTH TWICE DAILY AS NEEDED (Patient taking differently: Take 75 mg by mouth 2 (two) times daily as needed (for pain).) 60 tablet 1   diclofenac (VOLTAREN) 75 MG EC tablet Take 1 tablet (75 mg total) by mouth 2 (two) times daily as needed. 60 tablet 0   diclofenac Sodium (VOLTAREN) 1 % GEL Apply 4 g topically 4 (four) times daily. 100 g 0   dicyclomine (BENTYL) 10 MG capsule Take 1 capsule (10 mg total) by mouth 3 (three) times daily before meals. 90 capsule 6   escitalopram (LEXAPRO) 10 MG tablet Take 1 tablet (10 mg total) by mouth daily. 30 tablet 0   febuxostat (ULORIC) 40 MG tablet Take 1 tablet (40 mg total) by mouth daily. 30 tablet 6   feeding supplement (ENSURE ENLIVE / ENSURE PLUS) LIQD Take 237 mLs by mouth 2 (two) times daily between meals.     Fluticasone-Umeclidin-Vilant (TRELEGY ELLIPTA) 100-62.5-25 MCG/INH AEPB Inhale 1 puff into the lungs daily. 60 each 3   furosemide (LASIX) 80 MG tablet Take 1 tablet (80 mg total) by mouth daily. (Patient taking differently: Take 20 mg by mouth daily.) 90 tablet 3   gabapentin (NEURONTIN) 600 MG tablet Take 1 tablet (600 mg total) by mouth 2 (two) times daily. 60 tablet 6   Iron, Ferrous Sulfate, 325 (65 Fe) MG TABS Take 325 mg by mouth daily. 60 tablet 2   ketoconazole (NIZORAL) 2 % cream Apply 1 application topically daily.     lidocaine (LIDODERM) 5 % Place 1 patch onto the skin daily. Remove & Discard patch within 12 hours or as directed by MD 30 patch 1   lisdexamfetamine (VYVANSE) 20 MG capsule Take 20 mg by mouth daily.     Misc. Devices [employer] wheelchair.  Diagnosis-frequent falls, bilateral knee osteoarthritis 1 each 0   Misc. Devices MISC Bedside Commode DX: frequent fall 1 each 0   montelukast (SINGULAIR) 10 MG tablet TAKE 1 TABLET(10 MG) BY MOUTH AT BEDTIME (Patient taking differently: Take 10 mg by mouth at  bedtime.) 30 tablet 6   Multiple Vitamin (MULTIVITAMIN WITH MINERALS) TABS tablet Take 1 tablet by mouth daily.     omeprazole (PRILOSEC) 40 MG capsule TAKE 1 CAPSULE(40 MG) BY MOUTH DAILY (Patient taking differently: Take 40 mg by mouth daily.) 30 capsule 5   oxybutynin (DITROPAN) 5 MG tablet TAKE 1 TABLET(5 MG) BY MOUTH TWICE DAILY (Patient taking differently: Take 5 mg by mouth 2 (two) times daily.) 60 tablet 5   potassium chloride SA (KLOR-CON) 20 MEQ tablet Take 2 tablets (40 mEq total) by mouth 2 (two) times daily. 180 tablet 3   prazosin (MINIPRESS) 2 MG capsule Take 2 mg by mouth at bedtime.     terbinafine (LAMISIL AT) 1 % cream Apply 1 application topically 2 (two) times daily. 30 g 1   thiamine 100 MG tablet Take 1 tablet (100 mg total) by mouth daily.     tiZANidine (ZANAFLEX) 4 MG tablet Take 1 tablet (4 mg total) by mouth every 8 (eight) hours as needed for muscle spasms. 30 tablet 1   traZODone (DESYREL) 50 MG tablet Take  1 tablet (50 mg total) by mouth at bedtime. 10 tablet 0   amLODipine (NORVASC) 5 MG tablet Take 1 tablet (5 mg total) by mouth daily. 30 tablet 6   Facility-Administered Medications Prior to Visit  Medication Dose Route Frequency Provider Last Rate Last Admin   betamethasone acetate-betamethasone sodium phosphate (CELESTONE) injection 6 mg  6 mg Other Once Magnus Sinning, MD        Allergies  Allergen Reactions   Hydrocodone Shortness Of Breath   Hydroxyzine Anaphylaxis and Shortness Of Breath   Latuda [Lurasidone Hcl] Anaphylaxis   Magnesium-Containing Compounds Anaphylaxis    Tolerated Ensure   Prednisone Anaphylaxis, Swelling and Other (See Comments)    Tongue swelling, lip swelling, throat swelling, per pt    Tramadol Anaphylaxis and Swelling   Codeine Nausea And Vomiting   Trazodone Other (See Comments)    paranoia   Sulfa Antibiotics Itching   Tape Rash    ROS Review of Systems  Constitutional:  Negative for chills and fever.  HENT:  Negative.    Eyes: Negative.   Respiratory:  Negative for shortness of breath.   Cardiovascular:  Positive for leg swelling. Negative for chest pain and palpitations.  Gastrointestinal: Negative.   Endocrine: Negative.   Genitourinary: Negative.   Musculoskeletal:  Positive for arthralgias.  Skin: Negative.   Allergic/Immunologic: Negative.   Neurological: Negative.   Hematological: Negative.   Psychiatric/Behavioral: Negative.       Objective:    Physical Exam Vitals and nursing note reviewed.  Constitutional:      Appearance: Normal appearance.  HENT:     Head: Normocephalic and atraumatic.     Right Ear: External ear normal.     Left Ear: External ear normal.     Nose: Nose normal.     Mouth/Throat:     Mouth: Mucous membranes are moist.     Pharynx: Oropharynx is clear.  Eyes:     Extraocular Movements: Extraocular movements intact.     Conjunctiva/sclera: Conjunctivae normal.     Pupils: Pupils are equal, round, and reactive to light.  Cardiovascular:     Rate and Rhythm: Normal rate and regular rhythm.     Pulses: Normal pulses.          Dorsalis pedis pulses are 2+ on the right side and 2+ on the left side.       Posterior tibial pulses are 2+ on the right side and 2+ on the left side.     Heart sounds: Normal heart sounds.  Pulmonary:     Effort: Pulmonary effort is normal.     Breath sounds: Normal breath sounds.  Musculoskeletal:     Cervical back: Normal range of motion and neck supple.     Right lower leg: 1+ Pitting Edema present.     Left lower leg: 1+ Pitting Edema present.  Skin:    General: Skin is warm and dry.  Neurological:     General: No focal deficit present.     Mental Status: [name] alert and oriented to person, place, and time.  Psychiatric:        Mood and Affect: Mood normal.        Behavior: Behavior normal.        Thought Content: Thought content normal.        Judgment: Judgment normal.    BP (!) 148/98 (BP Location: Left Arm,  Patient Position: Sitting, Cuff Size: Normal)   Pulse 97   Temp 98.8 F (  37.1 C) (Oral)   Resp 18   Ht '5\' 2"'  (1.575 m)   Wt 128 lb (58.1 kg)   SpO2 97%   BMI 23.41 kg/m  Wt Readings from Last 3 Encounters:  11/18/20 128 lb (58.1 kg)  10/15/20 100 lb (45.4 kg)  08/16/20 121 lb 11.1 oz (55.2 kg)     Health Maintenance Due  Topic Date Due   COVID-19 Vaccine (1) Never done   Zoster Vaccines- Shingrix (1 of 2) Never done   Pneumococcal Vaccine 44-1 Years old (2 - PCV) 09/20/2014   FOOT EXAM  03/12/2018   OPHTHALMOLOGY EXAM  04/20/2018   MAMMOGRAM  06/30/2019   INFLUENZA VACCINE  08/16/2020    There are no preventive care reminders to display for this patient.  Lab Results  Component Value Date   TSH 0.765 08/08/2020   Lab Results  Component Value Date   WBC 3.8 (L) 11/16/2020   HGB 9.0 (L) 11/16/2020   HCT 29.3 (L) 11/16/2020   MCV 98.3 11/16/2020   PLT 390 11/16/2020   Lab Results  Component Value Date   NA 140 11/16/2020   K 3.8 11/16/2020   CO2 23 11/16/2020   GLUCOSE 86 11/16/2020   BUN 10 11/16/2020   CREATININE 1.54 (H) 11/16/2020   BILITOT 0.3 11/16/2020   ALKPHOS 119 11/16/2020   AST 46 (H) 11/16/2020   ALT 36 11/16/2020   PROT 5.7 (L) 11/16/2020   ALBUMIN 3.2 (L) 11/16/2020   CALCIUM 9.0 11/16/2020   ANIONGAP 8 11/16/2020   EGFR 21 (L) 07/13/2020   Lab Results  Component Value Date   CHOL 127 08/05/2018   Lab Results  Component Value Date   HDL 62 08/05/2018   Lab Results  Component Value Date   LDLCALC 54 08/05/2018   Lab Results  Component Value Date   TRIG 53 08/05/2018   Lab Results  Component Value Date   CHOLHDL 2.0 08/05/2018   Lab Results  Component Value Date   HGBA1C 5.3 07/23/2020      Assessment & Plan:   Problem List Items Addressed This Visit       Nervous and Auditory   Neuropathy - Primary   Relevant Orders   Ambulatory referral to Pain Clinic     Other   Pain in both feet   Relevant Orders    Ambulatory referral to Pain Clinic   Localized edema    No orders of the defined types were placed in this encounter. 1. Neuropathy Patient agreeable to referral to pain clinic.  Patient has previously been referred last year and did have pending appointment.  Unfortunately patient was in the hospital when appointment occurred.   - Ambulatory referral to Pain Clinic  2. Pain in both feet Patient agreeable to return to podiatry.  Patient was given contact information.  Patient encouraged to continue using supportive measures as prescribed by podiatry. - Ambulatory referral to Pain Clinic  3. Localized edema Patient is currently taking 20 mg of Lasix once daily, was previously prescribed 80 mg.  Patient encouraged to follow a low-sodium diet, Tiffany use compression stockings, keep feet elevated when able.  Red flags given for prompt reevaluation.   I have reviewed the patient's medical history (PMH, PSH, Social History, Family History, Medications, and allergies) , and have been updated if relevant. I spent 30 minutes reviewing chart and  face to face time with patient.    Follow-up: Return if symptoms worsen or fail to improve.  Loraine Grip [place], PA-C

## 2020-11-18 NOTE — Patient Instructions (Signed)
I have restarted the referral for you to be seen by pain management.  I do encourage you to follow-up with podiatry for further evaluation of your foot pain. [facility] [address] [place], [place] [address] [place] [phone]   I encourage you to keep your feet elevated when able.  Kennieth Rad, PA-C Physician Assistant [facility] [email or web address]  Edema Edema is an abnormal buildup of fluids in the body tissues and under the skin. Swelling of the legs, feet, and ankles is a common symptom that becomes more likely as you get older. Swelling is also common in looser tissues, like around the eyes. When the affected area is squeezed, the fluid may move out of that spot and leave a dent for a few moments. This dent is called pitting edema. There are many possible causes of edema. Eating too much salt (sodium) and being on your feet or sitting for a long time can cause edema in your legs, feet, and ankles. Hot weather may make edema worse. Common causes of edema include: Heart failure. Liver or kidney disease. Weak leg blood vessels. Cancer. An injury. Pregnancy. Medicines. Being obese. Low protein levels in the blood. Edema is usually painless. Your skin may look swollen or shiny. Follow these instructions at home: Keep the affected body part raised (elevated) above the level of your heart when you are sitting or lying down. Do not sit still or stand for long periods of time. Do not wear tight clothing. Do not wear garters on your upper legs. Exercise your legs to get your circulation going. This helps to move the fluid back into your blood vessels, and it may help the swelling go down. Wear elastic bandages or support stockings to reduce swelling as told by your health care provider. Eat a low-salt (low-sodium) diet to reduce fluid as told by your health care provider. Depending on the cause of your swelling,  you may need to limit how much fluid you drink (fluid restriction). Take over-the-counter and prescription medicines only as told by your health care provider. Contact a health care provider if: Your edema does not get better with treatment. You have heart, liver, or kidney disease and have symptoms of edema. You have sudden and unexplained weight gain. Get help right away if: You develop shortness of breath or chest pain. You cannot breathe when you lie down. You develop pain, redness, or warmth in the swollen areas. You have heart, liver, or kidney disease and suddenly get edema. You have a fever and your symptoms suddenly get worse. Summary Edema is an abnormal buildup of fluids in the body tissues and under the skin. Eating too much salt (sodium) and being on your feet or sitting for a long time can cause edema in your legs, feet, and ankles. Keep the affected body part raised (elevated) above the level of your heart when you are sitting or lying down. This information is not intended to replace advice given to you by your health care provider. Make sure you discuss any questions you have with your health care provider. Document Revised: 11/11/2019 Document Reviewed: 10/28/2019 Elsevier Patient Education  2022 [employer].

## 2020-11-18 NOTE — Progress Notes (Signed)
Patient has taken some medication today not including BP medications. Patient has not eaten today. Patient reports generalized pain at a 10 currently.

## 2020-11-18 NOTE — Telephone Encounter (Signed)
Patient is calling today to report she is having increased leg and foot pain. Patient was recently released from hospital and feels better mentally. Patient states she is concerned about the circulation in her feet and legs- and although this has been a chronic problem for her- she feels that it is getting worse. Patient is using a walker to ambulate and states it is difficult for her. Patient thinks she needs a referral to someone to address her leg/foot pain. Appointment scheduled per protocol.

## 2020-11-22 ENCOUNTER — Other Ambulatory Visit (HOSPITAL_COMMUNITY): Payer: Self-pay | Admitting: Psychiatry

## 2020-11-23 ENCOUNTER — Ambulatory Visit: Payer: Self-pay

## 2020-11-23 ENCOUNTER — Telehealth: Payer: Self-pay | Admitting: Family Medicine
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
## 2020-11-23 NOTE — Telephone Encounter (Signed)
MJ from [facility], states patient needs authorization to be sent for a power chair and and hosp bed. Please call back

## 2020-11-25 ENCOUNTER — Telehealth (INDEPENDENT_AMBULATORY_CARE_PROVIDER_SITE_OTHER): Payer: Self-pay

## 2020-11-25 NOTE — Telephone Encounter (Signed)
Copied from [place] [id number]. Topic: General - Other >> Nov 24, 2020  9:08 AM Celene Kras wrote: Reason for CRM: Pt called and is requesting to speak with Social Worker in regards to the referral for the psychiatrist.   Please advise.

## 2020-11-25 NOTE — Telephone Encounter (Signed)
Copied from [place] [id number]. Topic: General - Other >> Nov 25, 2020 12:13 PM Leward Quan A wrote: Reason for CRM: Patient called in to inform Dr Margarita Rana that she need to resubmit orders for her to get an aide and it need to state that she can have 5 hrs a day. Say that her health have been deteriorating more each day where she falls all the time and say that at times she is not able to get to the toilet on time and have accidents on herself also said she  will be getting a power wheel chair that will help her get around. Patient stated that she want her Aide from [facility] agency Ph# [phone]. Patient also want the Social worker to call and tell her what Psychiatrist she will be seeing because she need to have the appointment in order to get her medications Please call patient for further information at Ph# [phone]

## 2020-11-26 ENCOUNTER — Other Ambulatory Visit: Payer: Self-pay | Admitting: Family Medicine

## 2020-11-26 NOTE — Telephone Encounter (Signed)
Will patient qualify for more hours.

## 2020-11-26 NOTE — Telephone Encounter (Signed)
Medication Refill - Medication: Prazosin, Potassium, Vyvanse, diazepam (pt requesting to have 10mg ), Saphris (Name brand)  Has the patient contacted their pharmacy? Yes.   Pt called stating that she contacted pharmacy and they told her to contact the PCP since she needed changes made. Please advise. (Agent: If no, request that the patient contact the pharmacy for the refill. If patient does not wish to contact the pharmacy document the reason why and proceed with request.) (Agent: If yes, when and what did the pharmacy advise?)  Preferred Pharmacy (with phone number or street name):  [facility] [phone] - [place], [place] [address]  [place] [address]  Phone: [phone] Fax: [phone]  Hours: Not open 24 hours   Has the patient been seen for an appointment in the last year OR does the patient have an upcoming appointment? Yes.    Agent: Please be advised that RX refills may take up to 3 business days. We ask that you follow-up with your pharmacy.

## 2020-11-27 IMAGING — CR DG CHEST 2V
2 series · 2 of 2 positions shown · non-contrast
Comparison: Prior radiograph from 12/02/2017.

CLINICAL DATA: Initial evaluation for acute trauma, fall.

EXAM:
CHEST - 2 VIEW

[w chest lat]
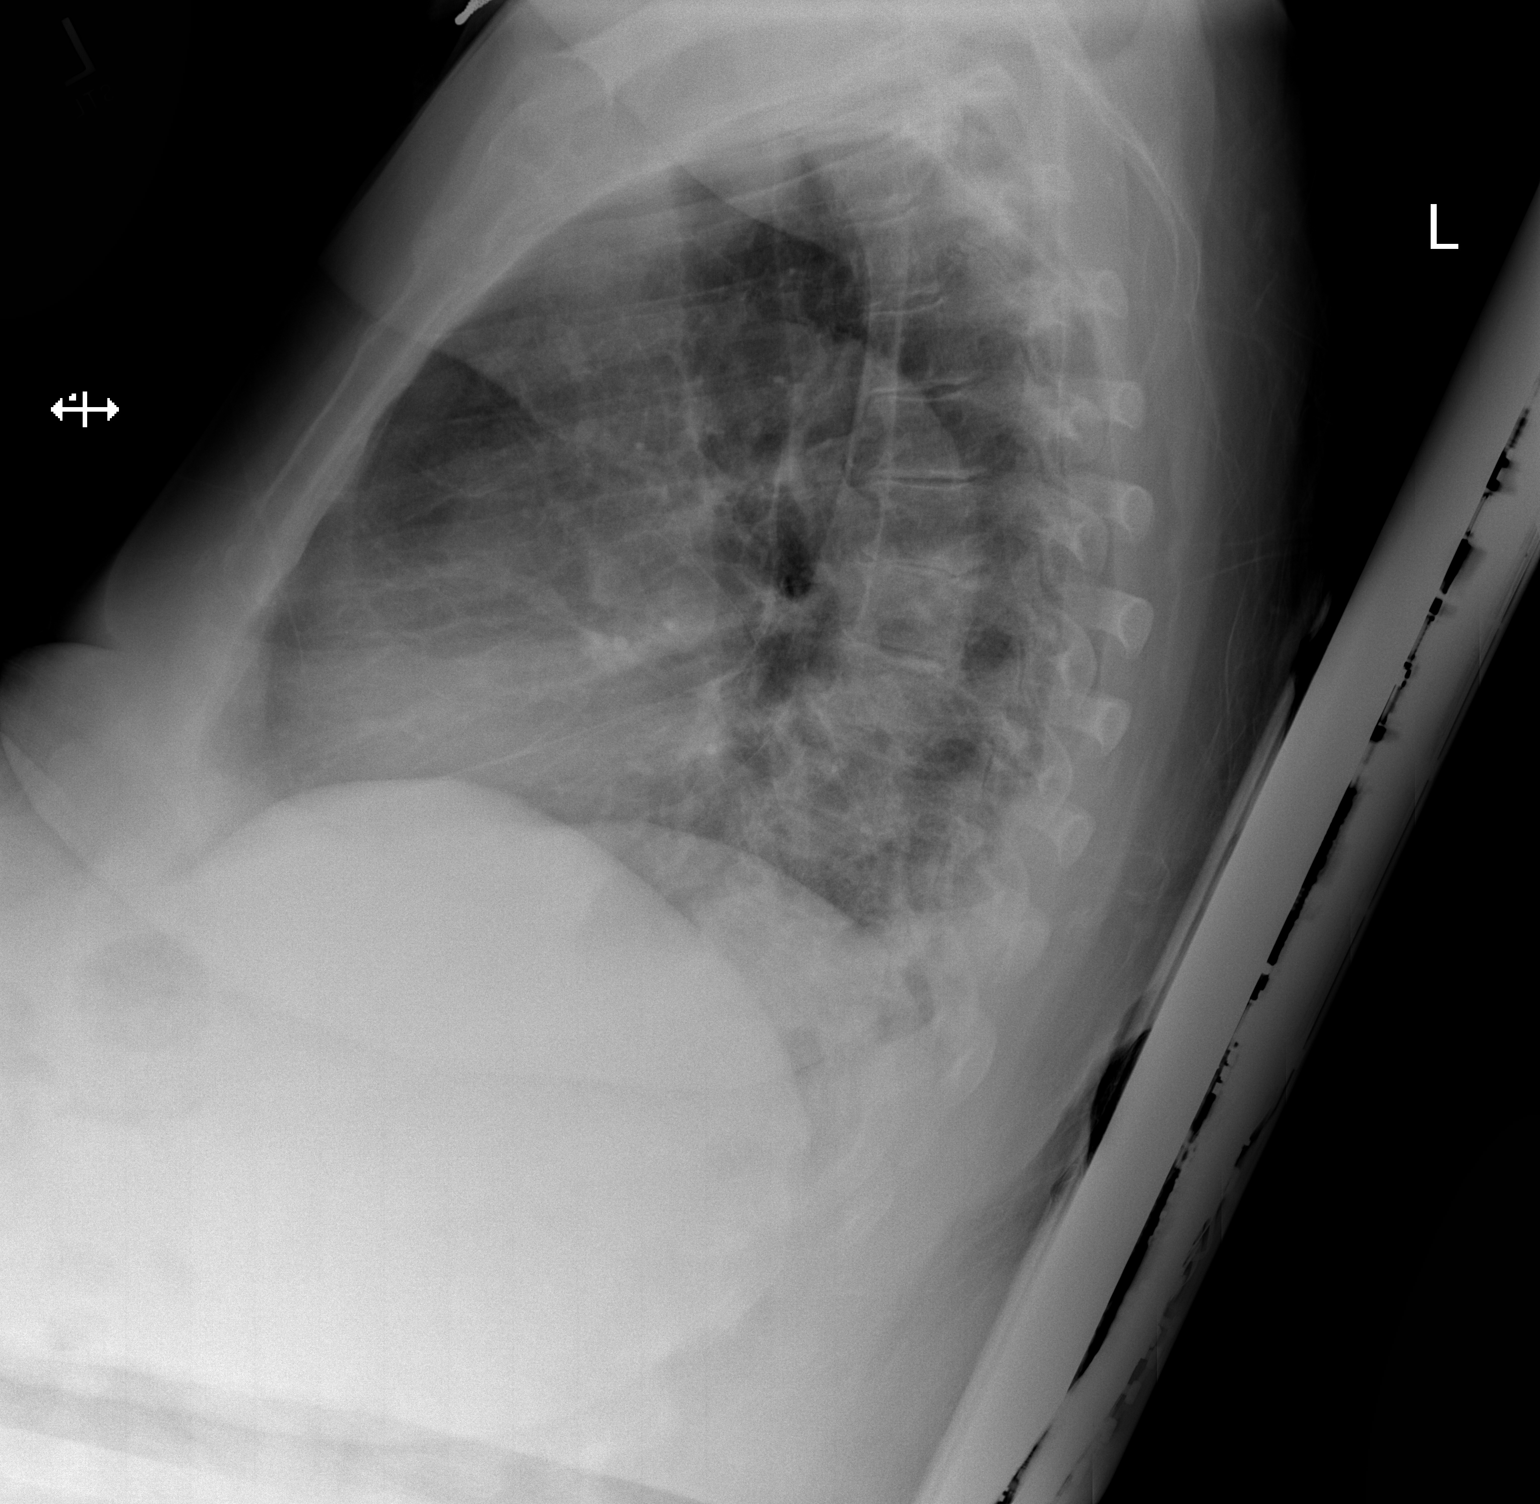

[x chest ap]
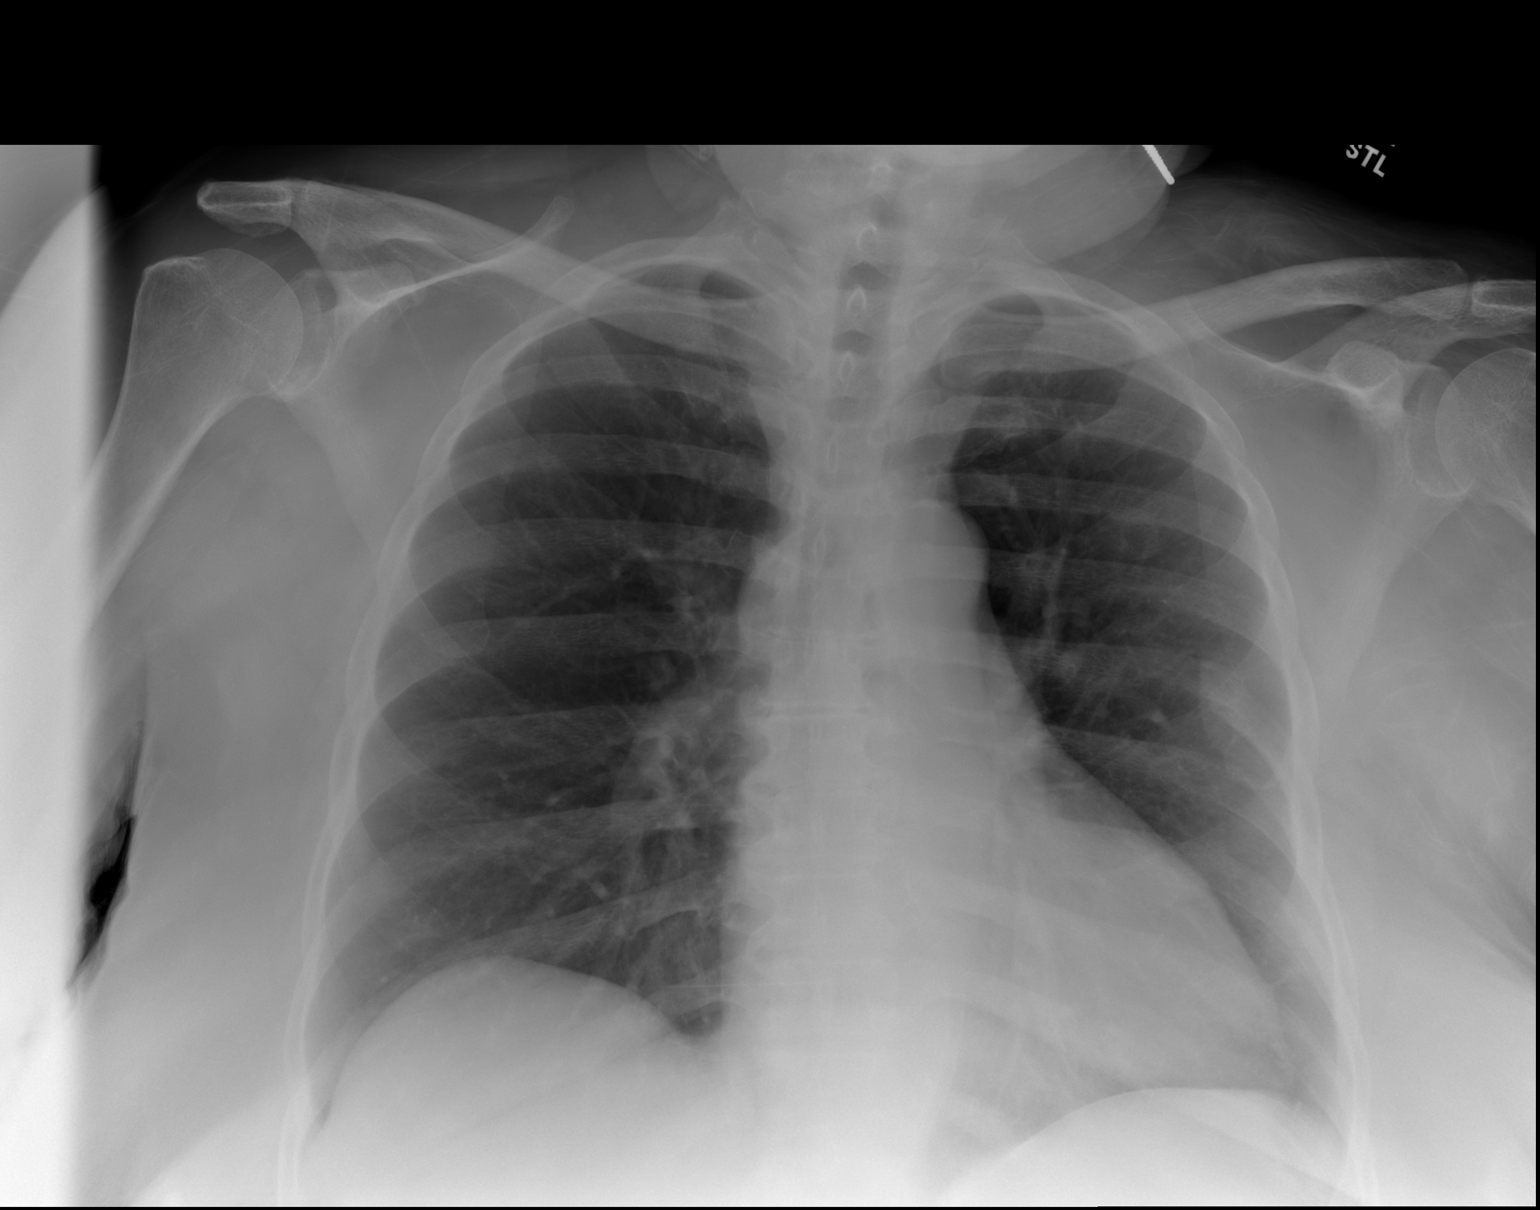

[2 of 2 positions shown; findings below may reference images not displayed]

FINDINGS: Mild cardiomegaly, stable. Mediastinal silhouette normal. Aortic
atherosclerosis.

The lungs are normally inflated. No airspace consolidation, pleural
effusion, or pulmonary edema is identified. There is no
pneumothorax.

No acute osseous abnormality identified.
IMPRESSION: No active cardiopulmonary disease.

## 2020-11-27 NOTE — Telephone Encounter (Signed)
Notes to clinic prescribers are not associated with this practice, please assess. Requested Prescriptions  Pending Prescriptions Disp Refills   diazepam (VALIUM) 5 MG tablet 8 tablet 1    Sig: Take 1 tablet (5 mg total) by mouth every 8 (eight) hours as needed for anxiety.     Not Delegated - Psychiatry:  Anxiolytics/Hypnotics Failed - 11/26/2020  5:28 PM      Failed - This refill cannot be delegated      Passed - Urine Drug Screen completed in last 360 days      Passed - Valid encounter within last 6 months    Recent Outpatient Visits           1 week ago Neuropathy   [place], PA-C   1 month ago Diarrhea, unspecified type   [place], Charlane Ferretti, MD   2 months ago Tinea pedis of both feet   [place], Charlane Ferretti, MD   2 months ago Diarrhea, unspecified type   [place], [place], MD   6 months ago Heel ulcer, left, with fat layer exposed ([place])   [place], Charlane Ferretti, MD       Future Appointments             In 4 days [name], MD [place]             potassium chloride SA (KLOR-CON) 20 MEQ tablet 180 tablet 3    Sig: Take 2 tablets (40 mEq total) by mouth 2 (two) times daily.     Endocrinology:  Minerals - Potassium Supplementation Failed - 11/26/2020  5:28 PM      Failed - Cr in normal range and within 360 days    Creat  Date Value Ref Range Status  02/18/2016 1.22 (H) 0.50 - 1.05 mg/dL Final    Comment:      For patients > or = 61 years of age: The upper reference limit for Creatinine is approximately 13% higher for people identified as African-American.      Creatinine, Ser  Date Value Ref Range Status  11/16/2020 1.54 (H) 0.44 - 1.00 mg/dL Final   Creatinine,U  Date Value Ref Range Status  06/17/2012 521.94 mg/dL  Final    Comment:    Result confirmed by automatic dilution.    Cutoff Values for Urine Drug Screen, Pain Mgmt          Drug Class           Cutoff (ng/mL)          Amphetamines             500          Barbiturates             200          Cocaine Metabolites      150          Benzodiazepines          200          Methadone                300          Opiates                  300  Phencyclidine             25          Propoxyphene             300          Marijuana Metabolites     50    For medical purposes only.   Creatinine, Urine  Date Value Ref Range Status  02/19/2020 300.84 mg/dL Final    Comment:    Performed at [facility], [place] [address] [place], [place] [address]          Passed - K in normal range and within 360 days    Potassium  Date Value Ref Range Status  11/16/2020 3.8 3.5 - 5.1 mmol/L Final          Passed - Valid encounter within last 12 months    Recent Outpatient Visits           1 week ago Neuropathy   [place], PA-C   1 month ago Diarrhea, unspecified type   [place], Charlane Ferretti, MD   2 months ago Tinea pedis of both feet   [place], Charlane Ferretti, MD   2 months ago Diarrhea, unspecified type   [place], Charlane Ferretti, MD   6 months ago Heel ulcer, left, with fat layer exposed [facility])   [place], Charlane Ferretti, MD       Future Appointments             In 4 days [name], MD [place]             prazosin (MINIPRESS) 2 MG capsule      Sig: Take 1 capsule (2 mg total) by mouth at bedtime.     Cardiovascular:  Alpha Blockers Failed - 11/26/2020  5:28 PM      Failed - Last BP in normal range    BP Readings from Last 1 Encounters:  11/18/20 (!) 148/98          Passed - Valid  encounter within last 6 months    Recent Outpatient Visits           1 week ago Neuropathy   [place], PA-C   1 month ago Diarrhea, unspecified type   [place], Charlane Ferretti, MD   2 months ago Tinea pedis of both feet   [place], Charlane Ferretti, MD   2 months ago Diarrhea, unspecified type   [place], Charlane Ferretti, MD   6 months ago Heel ulcer, left, with fat layer exposed ([place])   [place], Charlane Ferretti, MD       Future Appointments             In 4 days [name], MD [place]             asenapine (SAPHRIS) 5 MG SUBL 24 hr tablet 60 tablet 0    Sig: Place 2 tablets (10 mg total) under the tongue 2 (two) times daily.     Not Delegated - Psychiatry:  Antipsychotics - Second Generation (Atypical) - asenapine Failed - 11/26/2020  5:28 PM  Failed - This refill cannot be delegated      Failed - HCT in normal range and within 180 days    HCT  Date Value Ref Range Status  11/16/2020 29.3 (L) 36.0 - 46.0 % Final   Hematocrit  Date Value Ref Range Status  11/25/2019 28.2 (L) 34.0 - 46.6 % Final          Failed - HGB in normal range and within 180 days    Hemoglobin  Date Value Ref Range Status  11/16/2020 9.0 (L) 12.0 - 15.0 g/dL Final  11/25/2019 9.0 (L) 11.1 - 15.9 g/dL Final          Failed - WBC in normal range and within 180 days    WBC  Date Value Ref Range Status  11/16/2020 3.8 (L) 4.0 - 10.5 K/uL Final          Passed - PLT in normal range and within 360 days    Platelets  Date Value Ref Range Status  11/16/2020 390 150 - 400 K/uL Final  11/25/2019 375 150 - 450 x10E3/uL Final          Passed - Valid encounter within last 6 months    Recent Outpatient Visits           1 week ago Neuropathy   [place], [place], PA-C   1 month ago Diarrhea, unspecified type   [place], Charlane Ferretti, MD   2 months ago Tinea pedis of both feet   [place], Charlane Ferretti, MD   2 months ago Diarrhea, unspecified type   [place], [place], MD   6 months ago Heel ulcer, left, with fat layer exposed ([place])   [place], Charlane Ferretti, MD       Future Appointments             In 4 days [name], MD [place]             lisdexamfetamine (VYVANSE) 20 MG capsule      Sig: Take 1 capsule (20 mg total) by mouth daily.     Not Delegated - Psychiatry:  Stimulants/ADHD Failed - 11/26/2020  5:28 PM      Failed - This refill cannot be delegated      Passed - Urine Drug Screen completed in last 360 days      Passed - Valid encounter within last 3 months    Recent Outpatient Visits           1 week ago Neuropathy   [place], PA-C   1 month ago Diarrhea, unspecified type   [place], Charlane Ferretti, MD   2 months ago Tinea pedis of both feet   [place], Charlane Ferretti, MD   2 months ago Diarrhea, unspecified type   [place], Charlane Ferretti, MD   6 months ago Heel ulcer, left, with fat layer exposed [facility])   [place], MD       Future Appointments             In 4 days [name], MD [place]

## 2020-11-29 ENCOUNTER — Ambulatory Visit: Payer: Self-pay

## 2020-11-29 ENCOUNTER — Telehealth (HOSPITAL_COMMUNITY): Payer: 59 | Admitting: Psychiatry

## 2020-11-29 DIAGNOSIS — M109 Gout, unspecified: Secondary | ICD-10-CM

## 2020-11-29 DIAGNOSIS — I10 Essential (primary) hypertension: Secondary | ICD-10-CM

## 2020-11-29 NOTE — Telephone Encounter (Signed)
Pt called in stating that her BP is 197/142 and then this morning earlier it was 187/159. Pt uses a auto wrist cuff. Says that ever since she was Dc'd from hospital her BP has been high. Says that EMS came out to check her BP and advised they take her to ED and she refused. Pt says she hasnt had her BP meds in a week because of her son taking them. Advised I can place a refill request in and verified pharmacy. Pt has appt on 12/01/20 at 0930 with Dr. Margarita Rana. Advised pt to come to that appt so these issues can be addressed. Pt also wanting valium refilled but says the 5mg  doesn't help and doesn't see her mental health DR anymore. Advised I would place refill request in for the 5 mg but couldn't promise it would get refilled. Pt says that her legs have been swelling from time to time as well and that she has been falling often too. Says that she talked to insurance company about getting another electric WC and they told her to let Dr. Margarita Rana know they just need new authorization for it to process. Informed pt that all of this info would be placed in my note and Dr. Margarita Rana can address this on Wed at appt. Care advice given and pt verbalized understanding.   Reason for Disposition  Systolic BP  >= 482 OR Diastolic >= 707  Answer Assessment - Initial Assessment Questions 1. BLOOD PRESSURE: "What is the blood pressure?" "Did you take at least two measurements 5 minutes apart?"     197/142 2. ONSET: "When did you take your blood pressure?"     Just a few mins ago 187/159 3. HOW: "How did you obtain the blood pressure?" (e.g., visiting nurse, automatic home BP monitor)     Automatic wrist cuff 4. HISTORY: "Do you have a history of high blood pressure?"     Yes 5. MEDICATIONS: "Are you taking any medications for blood pressure?" "Have you missed any doses recently?"     Yes, says son has taken her BP meds, been a week without meds 6. OTHER SYMPTOMS: "Do you have any symptoms?" (e.g., headache, chest pain,  blurred vision, difficulty breathing, weakness)     no 7. PREGNANCY: "Is there any chance you are pregnant?" "When was your last menstrual period?"     No  Protocols used: Blood Pressure - High-A-AH

## 2020-11-29 NOTE — Telephone Encounter (Signed)
Attempted to contact patient  [phone] to discuss request for increased PCS hours.  Phone just rang, no option to leave a voicemail message.

## 2020-11-29 NOTE — Telephone Encounter (Signed)
Pt called back requesting information regarding a psychiatrist, please advise

## 2020-11-29 NOTE — Telephone Encounter (Signed)
Will address at upcoming visit.

## 2020-11-30 MED ORDER — POTASSIUM CHLORIDE CRYS ER 20 MEQ PO TBCR: 40.0000 meq | EXTENDED_RELEASE_TABLET | Freq: Two times a day (BID) | ORAL | 3 refills | Status: DC

## 2020-11-30 NOTE — Telephone Encounter (Signed)
Call returned to patient. She explained that she has an aide for 2.5-3 hours/day x 7 days/week and she would like to increase those hours to 5 hours/day.  She said she has been falling and needs more help at home. This CM offered the option of assisted living where she would have support 24/7 but she was adamant that she does not want a facility. Explained to her that Dr Margarita Rana would have to be able to document a change in her medical status in order to support the request for an increase in hours. [name] seeing Dr Margarita Rana tomorrow.   She would also like to speak to [facility], LCSW about a referral to a new psychiatrist and she has an appointment with [facility] tomorrow also.   The patient also said that she has information about obtaining a new power chair and she will discuss with provider tomorrow.  This CM explained that she was denied a new power chair a couple of months ago. She said that she understands but has more information to share.

## 2020-12-01 ENCOUNTER — Institutional Professional Consult (permissible substitution): Payer: 59 | Admitting: Clinical

## 2020-12-01 ENCOUNTER — Other Ambulatory Visit: Payer: Self-pay

## 2020-12-01 ENCOUNTER — Ambulatory Visit: Payer: 59 | Attending: Family Medicine | Admitting: Family Medicine

## 2020-12-01 ENCOUNTER — Encounter: Payer: Self-pay | Admitting: Family Medicine

## 2020-12-01 ENCOUNTER — Ambulatory Visit (HOSPITAL_BASED_OUTPATIENT_CLINIC_OR_DEPARTMENT_OTHER): Payer: 59 | Admitting: Clinical

## 2020-12-01 VITALS — BP 132/87 | HR 95 | Ht 62.0 in

## 2020-12-01 DIAGNOSIS — N949 Unspecified condition associated with female genital organs and menstrual cycle: Secondary | ICD-10-CM

## 2020-12-01 DIAGNOSIS — M109 Gout, unspecified: Secondary | ICD-10-CM

## 2020-12-01 DIAGNOSIS — M79672 Pain in left foot: Secondary | ICD-10-CM

## 2020-12-01 DIAGNOSIS — F250 Schizoaffective disorder, bipolar type: Secondary | ICD-10-CM

## 2020-12-01 DIAGNOSIS — R600 Localized edema: Secondary | ICD-10-CM

## 2020-12-01 DIAGNOSIS — M79671 Pain in right foot: Secondary | ICD-10-CM | POA: Diagnosis not present

## 2020-12-01 MED ORDER — COLCHICINE 0.6 MG PO TABS: ORAL_TABLET | ORAL | 6 refills | Status: DC

## 2020-12-01 MED ORDER — CYCLOBENZAPRINE HCL 10 MG PO TABS: 10.0000 mg | ORAL_TABLET | Freq: Two times a day (BID) | ORAL | 6 refills | Status: DC | PRN

## 2020-12-01 MED ORDER — TRELEGY ELLIPTA 100-62.5-25 MCG/ACT IN AEPB: 1.0000 | INHALATION_SPRAY | Freq: Every day | RESPIRATORY_TRACT | 6 refills | Status: DC

## 2020-12-01 NOTE — Patient Instructions (Signed)
Edema ?Edema is when you have too much fluid in your body or under your skin. Edema may make your legs, feet, and ankles swell. Swelling often happens in looser tissues, such as around your eyes. This is a common condition. It gets more common as you get older. ?There are many possible causes of edema. These include: ?Eating too much salt (sodium). ?Being on your feet or sitting for a long time. ?Certain medical conditions, such as: ?Pregnancy. ?Heart failure. ?Liver disease. ?Kidney disease. ?Cancer. ?Hot weather may make edema worse. Edema is usually painless. Your skin may look swollen or shiny. ?Follow these instructions at home: ?Medicines ?Take over-the-counter and prescription medicines only as told by your doctor. ?Your doctor may prescribe a medicine to help your body get rid of extra water (diuretic). Take this medicine if you are told to take it. ?Eating and drinking ?Eat a low-salt (low-sodium) diet as told by your doctor. Sometimes, eating less salt may reduce swelling. ?Depending on the cause of your swelling, you may need to limit how much fluid you drink (fluid restriction). ?General instructions ?Raise the injured area above the level of your heart while you are sitting or lying down. ?Do not sit still or stand for a long time. ?Do not wear tight clothes. Do not wear garters on your upper legs. ?Exercise your legs. This can help the swelling go down. ?Wear compression stockings as told by your doctor. It is important that these are the right size. These should be prescribed by your doctor to prevent possible injuries. ?If elastic bandages or wraps are recommended, use them as told by your doctor. ?Contact a doctor if: ?Treatment is not working. ?You have heart, liver, or kidney disease and have symptoms of edema. ?You have sudden and unexplained weight gain. ?Get help right away if: ?You have shortness of breath or chest pain. ?You cannot breathe when you lie down. ?You have pain, redness, or warmth  in the swollen areas. ?You have heart, liver, or kidney disease and get edema all of a sudden. ?You have a fever and your symptoms get worse all of a sudden. ?These symptoms may be an emergency. Get help right away. Call 911. ?Do not wait to see if the symptoms will go away. ?Do not drive yourself to the hospital. ?Summary ?Edema is when you have too much fluid in your body or under your skin. ?Edema may make your legs, feet, and ankles swell. Swelling often happens in looser tissues, such as around your eyes. ?Raise the injured area above the level of your heart while you are sitting or lying down. ?Follow your doctor's instructions about diet and how much fluid you can drink. ?This information is not intended to replace advice given to you by your health care provider. Make sure you discuss any questions you have with your health care provider. ?Document Revised: 09/06/2020 Document Reviewed: 09/06/2020 ?Elsevier Patient Education ? 2022 [employer] ? ?
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
## 2020-12-02 ENCOUNTER — Telehealth: Payer: Self-pay | Admitting: Family Medicine

## 2020-12-02 ENCOUNTER — Telehealth: Payer: Self-pay | Admitting: Cardiology

## 2020-12-02 NOTE — Telephone Encounter (Signed)
Pt would like someone to call her and give her a list of the agencies she can use to get an aid. Pt wants to check them out first, because she wants a white aid, not black.  Pt would also like a call from the case manager and Education officer, museum.

## 2020-12-02 NOTE — Telephone Encounter (Signed)
Asking that the nurse gives her a call in regards to her legs. Please advise

## 2020-12-02 NOTE — Telephone Encounter (Signed)
Attempted to call patient several times and received busy signal each time.

## 2020-12-03 ENCOUNTER — Other Ambulatory Visit (HOSPITAL_COMMUNITY): Payer: Self-pay | Admitting: Psychiatry
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
## 2020-12-04 ENCOUNTER — Encounter: Payer: Self-pay | Admitting: Physician Assistant

## 2020-12-04 NOTE — Progress Notes (Deleted)
Cardiology Office Note    Date:  12/04/2020   ID:  Tiffany [name], DOB Jul 26, 1960, MRN [id number]  PCP:  [name], MD  Cardiologist:  Fransico Him, MD  Electrophysiologist:  None   Chief Complaint: ***  History of Present Illness:   Tiffany [name] is a 59 y.o. female with complex history of asthma, COPD with chronic respiratory failure previously on home O2, chronic diastolic CHF, normal coronaries 2019, mild PAH, mild carotid disease (1-39% BICA 11/2016), sinus bradycardia, cardiac arrest 2018 due to respiratory failure, esophageal stricture, HTN, HLD, DM2, cocaine use, psychiatric diagnoses with schizoaffective disease and prior paranoia/SI, CKD stage III (borderline a-b), anemia, leukopenia by labs, stroke, prolonged QT interval per PMH, cholelithiasis, left adnexal cyst who presents for cardiac follow-up.  She had previously seen several different cardiologists including [name], Dr. Haroldine Laws, Dr. Harrington Challenger and Dr. Einar Gip. She has a history of diastolic CHF confounded by COPD, requiring torsemide/metolazone in the past. Compliance was previously reported to be an issue. Prior cath 06/2017 showed normal coronaries, normal LVEF, mild PAH -> weight loss recommended. Dr. Haroldine Laws previously advised her to limit her metolazone use to no more than 2x week but the patient continued to take 3x/week. She was later seen in the hospital 04/2020 by our team for sinus bradycardia in the setting of AKI/GI issues and potential surreptitious drug use. She was asymptomatic from a cardiac standpoint, no further workup felt necessary. Last 2D echo 04/2020 EF 55-60%, grade 1 DD, normal RV, mild LAE. Last OV here was 05/2020 with Dr. Harrington Challenger with elevated BNP prompting increase in diuretics. She's also had episodic medical admissions for various issues and behavioral health encounters for suicidal behavior. Last PCP note 12/01/20 reviewed, includes notation that care management, social work and APS have been  involved in care.  Abnormal CBC, CMET before LDL for carotids? Wouldn't repeat at this time  Chronic diastolic CHF with prior mild PAH on cath 2019 CKD stage 3 (borderline a-b) Sinus bradycardia History of prolonged QT interval Mild carotid artery disease   Labwork independently reviewed: 11/2020 BNP 107, WBC 3.8, Hgb 9.0, plt 309, K 3.8, Cr 1.54, albumin 3.2, AST 46, ALT 36 2020 LDL 54   Past Medical History:  Diagnosis Date   Agitation 11/22/2017   Anoxic brain injury ([place]) 09/08/2016   C. Arrest due to respiratory failure and COPD exacerbation   Anxiety    Arthritis    "all over" (04/10/2016)   Asthma 10/18/2010   Binge eating disorder    Cardiac arrest ([place]) 09/08/2016   PEA   Carotid artery stenosis    1-39% bilateral by dopplers 11/2016   Chronic diastolic (congestive) heart failure ([place])    Chronic kidney disease    "I see a kidney dr." (04/10/2016)   Chronic pain syndrome 06/18/2012   Chronic post-traumatic stress disorder (PTSD) 05/27/2018   Chronic respiratory failure with hypoxia and hypercapnia ([place]) 06/22/2015   TRILOGY Vent >AVAPA-ES., Vt target 200-400, Max P 30 , PS max 20 , PS min 6-10 , E Max 6, E Min 4, Rate Auto AVAPS Rate 2 (titrate for pt comfort) , bleed O2 at 5l/m continuous flow .    CKD (chronic kidney disease) stage 3, GFR 30-59 ml/min (HCC) 12/15/2016   Closed displaced fracture of fifth metacarpal bone 03/21/2018   Cocaine use disorder, severe, in sustained remission ([place]) 28/07/6809   Complication of anesthesia    decreased bp, decreased heart rate   COPD (chronic obstructive pulmonary disease) (  [place]) 07/08/2014   Depression    Diabetic neuropathy ([place]) 04/24/2011   Difficulty with speech 01/24/2018   Disorder of nervous system    Drug abuse ([place]) 11/21/2017   Dyslipidemia 04/24/2011   Elevated troponin 04/28/2012   Emphysema    Encephalopathy 11/21/2017   Essential hypertension 03/22/2016   Fibula fracture 07/10/2016   Frequent falls 10/11/2017   GERD  (gastroesophageal reflux disease)    Gout 04/11/2017   Heart attack ([place]) 1980s   History of blood transfusion 1994   "couldn't stop bleeding from my period"   History of drug abuse in remission ([place]) 11/28/2015   Quit in 2017   Hyperlipidemia LDL goal <70    Incontinence    Manic depression ([place])    Morbid obesity ([place]) 10/18/2010   Obstructive sleep apnea 10/18/2010   On home oxygen therapy    "6L; 24/7" (04/10/2016)   OSA on CPAP    "wear mask sometimes" (04/10/2016)   Paranoid ([place])    "sometimes; I'm on RX for it" (04/10/2016)   Prolonged Q-T interval on ECG    Rectal bleeding 12/31/2015   Right carotid bruit 11/09/2016   Schizoaffective disorder, bipolar type ([place]) 04/05/2018   Seasonal allergies    Seborrheic keratoses 12/31/2013   Seizures ([place])    "don't know what kind; last one was ~ 1 yr ago" (04/10/2016)   Stroke [facility]) 1980s   denies residual on 04/10/2016   Thrush 09/19/2013   Type 2 diabetes mellitus ([place]) 10/18/2010    Past Surgical History:  Procedure Laterality Date   CESAREAN SECTION  1997   HERNIA REPAIR     IR CHOLANGIOGRAM EXISTING TUBE  07/20/2016   IR PERC CHOLECYSTOSTOMY  05/10/2016   IR RADIOLOGIST EVAL & MGMT  06/08/2016   IR RADIOLOGIST EVAL & MGMT  06/29/2016   IR SINUS/FIST TUBE CHK-NON GI  07/12/2016   RIGHT/LEFT HEART CATH AND CORONARY ANGIOGRAPHY N/A 06/19/2017   Procedure: RIGHT/LEFT HEART CATH AND CORONARY ANGIOGRAPHY;  Surgeon: Jolaine Artist, MD;  Location: [facility];  Service: Cardiovascular;  Laterality: N/A;   TIBIA IM NAIL INSERTION Right 07/12/2016   Procedure: INTRAMEDULLARY (IM) NAIL RIGHT TIBIA;  Surgeon: Leandrew Koyanagi, MD;  Location: [facility];  Service: Orthopedics;  Laterality: Right;   UMBILICAL HERNIA REPAIR  ~ 1963   "that's why I don't have a belly button"   VAGINAL HYSTERECTOMY      Current Medications: No outpatient medications have been marked as taking for the 12/07/20 encounter (Appointment) with Charlie Pitter, PA-C.    Current Facility-Administered Medications for the 12/07/20 encounter (Appointment) with Charlie Pitter, PA-C  Medication   betamethasone acetate-betamethasone sodium phosphate (CELESTONE) injection 6 mg   ***   Allergies:   Hydrocodone, Hydroxyzine, Latuda [lurasidone hcl], Magnesium-containing compounds, Prednisone, Tramadol, Codeine, Trazodone, Sulfa antibiotics, and Tape   Social History   Socioeconomic History   Marital status: Widowed    Spouse name: Not on file   Number of children: 3   Years of education: Not on file   Highest education level: Not on file  Occupational History   Occupation: disabled    Comment: factory production  Tobacco Use   Smoking status: Former    Packs/day: 1.50    Years: 38.00    Pack years: 57.00    Types: Cigarettes    Start date: 03/13/1977    Quit date: 04/10/2016    Years since quitting: 4.6   Smokeless tobacco: Never  Vaping Use  Vaping Use: Never used  Substance and Sexual Activity   Alcohol use: No    Alcohol/week: 0.0 standard drinks   Drug use: Not Currently    Types: Cocaine    Comment: 04/10/2016 "last used cocaine back in November 2017"   Sexual activity: Not Currently    Birth control/protection: Surgical  Other Topics Concern   Not on file  Social History Narrative   Has 1 son, [name]   Lives with son and his boyfriend   Her house has ramps and handrails should she ever needs them.    Her mother lives down the street from her and is a good support person in addition to her son.   She drives herself, has private transportation.    Cocaine free since 02/24/16, smoke free since 04/10/16   Social Determinants of Health   Financial Resource Strain: Not on file  Food Insecurity: Not on file  Transportation Needs: Not on file  Physical Activity: Not on file  Stress: Not on file  Social Connections: Not on file     Family History:  The patient's ***family history includes Anxiety disorder in her sister; Bipolar disorder  in her sister; Cancer in her father and mother; Depression in her brother, mother, sister, and sister; Heart failure in an other family member; Schizophrenia in her sister.  ROS:   Please see the history of present illness. Otherwise, review of systems is positive for ***.  All other systems are reviewed and otherwise negative.    EKGs/Labs/Other Studies Reviewed:    Studies reviewed are outlined and summarized above. Reports included below if pertinent.  2D echo 04/2020  1. Left ventricular ejection fraction, by estimation, is 55 to 60%. The  left ventricle has normal function. The left ventricle has no regional  wall motion abnormalities. Left ventricular diastolic parameters are  consistent with Grade I diastolic  dysfunction (impaired relaxation).   2. Right ventricular systolic function is normal. The right ventricular  size is normal. There is normal pulmonary artery systolic pressure. The  estimated right ventricular systolic pressure is 73.2 mmHg.   3. Left atrial size was mildly dilated.   4. The mitral valve is grossly normal. Trivial mitral valve  regurgitation. No evidence of mitral stenosis.   5. The aortic valve is tricuspid. Aortic valve regurgitation is not  visualized. No aortic stenosis is present.   6. The inferior vena cava is normal in size with greater than 50%  respiratory variability, suggesting right atrial pressure of 3 mmHg.   R/LHC 06/2017 Findings:   Ao = 117/72 (92) LV = 111/18 RA = 10 RV = 40/12 PA = 48/18 (29) PCW = 19 Fick cardiac output/index = 6.9/3.0 PVR = 1,5 WU Ao sat = 98% PA sat = 66%, 67%   Assessment:   1. Normal coronary arteries 2. Normal LV function EF 60-65% 3. Mild PAH   Plan/Discussion:   Medical management. Needs weight loss.    Glori Bickers, MD  1:25 PM      EKG:  EKG is ordered today, personally reviewed, demonstrating ***  Recent Labs: 07/13/2020: NT-Pro BNP 201 08/08/2020: TSH 0.765 08/16/2020: Magnesium  1.9 11/16/2020: ALT 36; B Natriuretic Peptide 107.7; BUN 10; Creatinine, Ser 1.54; Hemoglobin 9.0; Platelets 390; Potassium 3.8; Sodium 140  Recent Lipid Panel    Component Value Date/Time   CHOL 127 08/05/2018 1748   TRIG 53 08/05/2018 1748   HDL 62 08/05/2018 1748   CHOLHDL 2.0 08/05/2018 1748   CHOLHDL 3.7  01/07/2017 0005   VLDL 38 01/07/2017 0005   LDLCALC 54 08/05/2018 1748   LDLDIRECT 52 04/05/2012 1210    PHYSICAL EXAM:    VS:  There were no vitals taken for this visit.  BMI: There is no height or weight on file to calculate BMI.  GEN: Well nourished, well developed female in no acute distress HEENT: normocephalic, atraumatic Neck: no JVD, carotid bruits, or masses Cardiac: ***RRR; no murmurs, rubs, or gallops, no edema  Respiratory:  clear to auscultation bilaterally, normal work of breathing GI: soft, nontender, nondistended, + BS MS: no deformity or atrophy Skin: warm and dry, no rash Neuro:  Alert and Oriented x 3, Strength and sensation are intact, follows commands Psych: euthymic mood, full affect  Wt Readings from Last 3 Encounters:  11/18/20 128 lb (58.1 kg)  10/15/20 100 lb (45.4 kg)  08/16/20 121 lb 11.1 oz (55.2 kg)     ASSESSMENT & PLAN:   ***     Disposition: F/u with ***   Medication Adjustments/Labs and Tests Ordered: Current medicines are reviewed at length with the patient today.  Concerns regarding medicines are outlined above. Medication changes, Labs and Tests ordered today are summarized above and listed in the Patient Instructions accessible in Encounters.   Signed, Charlie Pitter, PA-C  12/04/2020 5:52 PM    [facility] [address], [place], [place]  [address] Phone: [phone]; Fax: [phone]

## 2020-12-05 ENCOUNTER — Other Ambulatory Visit (HOSPITAL_COMMUNITY): Payer: Self-pay | Admitting: Psychiatry

## 2020-12-06 ENCOUNTER — Telehealth: Payer: Self-pay | Admitting: Clinical

## 2020-12-06 ENCOUNTER — Ambulatory Visit: Payer: Self-pay

## 2020-12-06 ENCOUNTER — Telehealth: Payer: Self-pay | Admitting: Physical Medicine and Rehabilitation

## 2020-12-06 NOTE — Telephone Encounter (Signed)
Initially was advised the pt was c/o  elevated high blood pressure. When asked what her BP reading are, pt stated that she does not know what her BP is because she does not have a machine to check it.  Pt c/o bilateral leg swelling,and  right hip pain. Pt stated the left leg is more swollen than the right leg. She stated the right leg pain is chronic. Pt stated she has been treated in the past with diuretic to help with swelling.Pt has an appt with her cardiac doctor in the am. Denies any redness or signs of infection.  Pt denies headache, chest pain, blurred vision, difficulty breathing or worsening of her breathing.  Pt stated that needs a refill of prazosin, Lexapro, cyclobenzaprine. Pt stated if the Lexapro and not be refilled, she would like a higher dose of cyclobenzaprine.  Pt stated that she wants a different Nursing assistant. Pt mentioned Angel from [employer] at [phone] and would like 5 hours of assistance.    Pt stated that she needs another electric wheelchair. She stated that her previous one is broken.   Advised pt will send message to her PCP.           Reason for Disposition  [1] MODERATE leg swelling (e.g., swelling extends up to knees) AND [2] new-onset or worsening  Ran out of BP medications  Answer Assessment - Initial Assessment Questions 1. BLOOD PRESSURE: "What is the blood pressure?" "Did you take at least two measurements 5 minutes apart?"     Does not have a  blood pressure  2. ONSET: "When did you take your blood pressure?"     Has not taken BP 3. HOW: "How did you obtain the blood pressure?" (e.g., visiting nurse, automatic home BP monitor)     N/a 4. HISTORY: "Do you have a history of high blood pressure?"     yes 5. MEDICATIONS: "Are you taking any medications for blood pressure?" "Have you missed any doses recently?"     yes 6. OTHER SYMPTOMS: "Do you have any symptoms?" (e.g., headache, chest pain, blurred vision, difficulty breathing,  weakness) no     7. PREGNANCY: "Is there any chance you are pregnant?" "When was your last menstrual period?"     no  Answer Assessment - Initial Assessment Questions 1. ONSET: "When did the swelling start?" (e.g., minutes, hours, days)     today 2. LOCATION: "What part of the leg is swollen?"  "Are both legs swollen or just one leg?"     Both legs - right leg aching the worst 3. SEVERITY: "How bad is the swelling?" (e.g., localized; mild, moderate, severe)  - Localized - small area of swelling localized to one leg  - MILD pedal edema - swelling limited to foot and ankle, pitting edema < 1/4 inch (6 mm) deep, rest and elevation eliminate most or all swelling  - MODERATE edema - swelling of lower leg to knee, pitting edema > 1/4 inch (6 mm) deep, rest and elevation only partially reduce swelling  - SEVERE edema - swelling extends above knee, facial or hand swelling present      Both legs left greater than right 4. REDNESS: "Does the swelling look red or infected?"     no 5. PAIN: "Is the swelling painful to touch?" If Yes, ask: "How painful is it?"   (Scale 1-10; mild, moderate or severe)     no 6. FEVER: "Do you have a fever?" If Yes, ask: "What is it, how  was it measured, and when did it start?"      no 7. CAUSE: "What do you think is causing the leg swelling?"     Shots against her will 8. MEDICAL HISTORY: "Do you have a history of heart failure, kidney disease, liver failure, or cancer?"     Liver  9. RECURRENT SYMPTOM: "Have you had leg swelling before?" If Yes, ask: "When was the last time?" "What happened that time?"     Yes- was prescribed diuretic 10. OTHER SYMPTOMS: "Do you have any other symptoms?" (e.g., chest pain, difficulty breathing)      no 11. PREGNANCY: "Is there any chance you are pregnant?" "When was your last menstrual period?"       N/a  Protocols used: Blood Pressure - High-A-AH, Leg Swelling and Edema-A-AH

## 2020-12-06 NOTE — Telephone Encounter (Signed)
Does patient know that her power wheelchair has been denied.

## 2020-12-06 NOTE — Telephone Encounter (Signed)
Pt called asking about the order for her a new power chair.  She said she is using a new medical assistance company and will need a new order put in for her power chair.  She said she talked to [facility] and they told her they would approve a new power chair.    CB#  [id number]

## 2020-12-06 NOTE — Telephone Encounter (Signed)
These concerns were addressed with the patient today  and the documentation is in another telephone call message

## 2020-12-06 NOTE — Telephone Encounter (Signed)
Pt called requesting a call back to set an appt for back injections. Please call pt at [phone].

## 2020-12-06 NOTE — Telephone Encounter (Signed)
Call placed to patient to address her questions.  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 She said she has contacted her insurance company and they told her that they will over ride any denial for a power chair even if she received one less than 5 years ago.  She was in agreement to sending a prescription to [employer] to check insurance coverage.   Regarding the request for change in PCS.  Explained to her that she needs to call [employer] and request an agency change.  Provided her with the phone number for [facility]. She would also like to request an increase in her PCS hours due to her constant pain.  This request for increased hours needs to be supported by PCP.   She plans to call [facility] tomorrow to schedule behavioral health follow up.  She wants to see a psychiatrist and a therapist.  [name] requesting a refill of escitalopram and vyvanse to hold her over until she sees the psychiatrist.  She explained that she needs the medication to help manage her obsessive compulsive behaviors which include eating too much and gaining weight.   Explained to her that Dr Margarita Rana would be notified of her requests.

## 2020-12-06 NOTE — Telephone Encounter (Signed)
Contacted pt and provided her with contact information for [facility] [phone]. Encouraged pt to call them and get appt set up. Pt has to see therapist for evaluation first and then they will set her up with the Psychiatric NP.

## 2020-12-07 ENCOUNTER — Ambulatory Visit: Payer: 59 | Admitting: Physician Assistant

## 2020-12-07 ENCOUNTER — Other Ambulatory Visit (INDEPENDENT_AMBULATORY_CARE_PROVIDER_SITE_OTHER): Payer: Self-pay | Admitting: Family Medicine

## 2020-12-07 DIAGNOSIS — I779 Disorder of arteries and arterioles, unspecified: Secondary | ICD-10-CM

## 2020-12-07 DIAGNOSIS — I5032 Chronic diastolic (congestive) heart failure: Secondary | ICD-10-CM

## 2020-12-07 DIAGNOSIS — N183 Chronic kidney disease, stage 3 unspecified: Secondary | ICD-10-CM

## 2020-12-07 DIAGNOSIS — I495 Sick sinus syndrome: Secondary | ICD-10-CM

## 2020-12-07 DIAGNOSIS — R9431 Abnormal electrocardiogram [ECG] [EKG]: Secondary | ICD-10-CM
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
## 2020-12-07 MED ORDER — PRAZOSIN HCL 2 MG PO CAPS: 2.0000 mg | ORAL_CAPSULE | Freq: Every day | ORAL | 0 refills | Status: DC

## 2020-12-07 MED ORDER — MISC. DEVICES MISC: 0 refills | Status: DC

## 2020-12-07 NOTE — Addendum Note (Signed)
Addended by: [name] on: 12/07/2020 11:59 AM   Modules accepted: Orders

## 2020-12-07 NOTE — Telephone Encounter (Signed)
Requested medication (s) are due for refill today: signed 12/07/20. Requesting 90 day supply  Requested medication (s) are on the active medication list: yes  Last refill:  12/07/20 #30 0 refills  Future visit scheduled: no  Notes to clinic:  signed today . Requesting 90 day supply     Requested Prescriptions  Pending Prescriptions Disp Refills   prazosin (MINIPRESS) 2 MG capsule [Pharmacy Med Name: PRAZOSIN 2MG  CAPSULES] 90 capsule     Sig: TAKE 1 CAPSULE(2 MG) BY MOUTH AT BEDTIME     Cardiovascular:  Alpha Blockers Passed - 12/07/2020 12:08 PM      Passed - Last BP in normal range    BP Readings from Last 1 Encounters:  12/01/20 132/87          Passed - Valid encounter within last 6 months    Recent Outpatient Visits           6 days ago Adnexal cyst   [place], [place], MD   2 weeks ago Neuropathy   [place], [place]   2 months ago Diarrhea, unspecified type   [place], Enobong, MD   3 months ago Tinea pedis of both feet   [place], Enobong, MD   3 months ago Diarrhea, unspecified type   [place], Enobong, MD       Future Appointments             In 1 week [name], MD [place], [facility]

## 2020-12-07 NOTE — Telephone Encounter (Signed)
Addressed.

## 2020-12-07 NOTE — Telephone Encounter (Signed)
Prescription for power wheelchair has been done and I have sent refill for prazosin to her pharmacy.  Escitalopram was done at her last office visit on 12/01/2020.  She will need to receive Vyvanse from her psychiatrist.

## 2020-12-08 ENCOUNTER — Telehealth: Payer: Self-pay | Admitting: Physical Medicine and Rehabilitation

## 2020-12-08 NOTE — Telephone Encounter (Signed)
Order for power wheelchair faxed to [facility] - fax # [phone].  Call placed to patient and informed her of above noted order and also that Dr Margarita Rana sent refill for prazosin to her pharmacy.  Escitalopram was done at her last office visit on 12/01/2020.  She will need to receive Vyvanse from her psychiatrist. She said she understood but the pharmacy does not have the escitalopram and she requested that this CM call to check on it.  Call placed to [place] [facility] [place] spoke to [place] who said that the escitalopram is ready for pick up. Call then placed to patient again and informed her that the medication is at her pharmacy ready for pick up.

## 2020-12-08 NOTE — Telephone Encounter (Signed)
Patient called. She would like an appointment with Dr. Newton.  

## 2020-12-08 NOTE — Telephone Encounter (Signed)
Pending response from Dr Ernestina Patches.

## 2020-12-08 NOTE — Telephone Encounter (Signed)
Pt has appt with Dr Harrington Challenger on 12/14/20 at 9:30 am Pt will discuss at office visit Per pt thinks she has been on Torsemide in the past and thinks this helped with swelling instead of taking Furosemide Instructed if S/S worsen to go to ED Pt verbalized understanding ./cy

## 2020-12-12 ENCOUNTER — Other Ambulatory Visit (HOSPITAL_COMMUNITY): Payer: Self-pay | Admitting: Psychiatry

## 2020-12-13 NOTE — Telephone Encounter (Signed)
I have tried reaching patient-unable to reach her. Can advise should she return call.

## 2020-12-14 ENCOUNTER — Other Ambulatory Visit: Payer: Self-pay | Admitting: Family Medicine

## 2020-12-14 ENCOUNTER — Ambulatory Visit: Payer: Medicaid Other | Admitting: Obstetrics and Gynecology

## 2020-12-14 ENCOUNTER — Telehealth: Payer: Self-pay | Admitting: Family Medicine

## 2020-12-14 ENCOUNTER — Ambulatory Visit: Payer: 59 | Admitting: Internal Medicine

## 2020-12-14 DIAGNOSIS — R296 Repeated falls: Secondary | ICD-10-CM

## 2020-12-14 NOTE — Telephone Encounter (Signed)
Patient called in asking if Tiffany [name] can please give her a call back when she is available. Please call Ph# [phone]

## 2020-12-14 NOTE — Telephone Encounter (Signed)
Copied from [place] [phone]. Topic: Quick Communication - Rx Refill/Question >> Dec 14, 2020  2:44 PM Leward Quan A wrote: Medication: dicyclomine (BENTYL) capsule 10 mg, colchicine 0.6 MG tablet  Per patient colchicine 0.6 MG tablet fell in a dirty place and now she need an override for early refill   Has the patient contacted their pharmacy? Yes.  Told the request will be sent  (Agent: If no, request that the patient contact the pharmacy for the refill. If patient does not wish to contact the pharmacy document the reason why and proceed with request.) (Agent: If yes, when and what did the pharmacy advise?)  Preferred Pharmacy (with phone number or street name): [facility] [phone] - [place], [address]  Phone:  [phone] Fax:  [phone]    Has the patient been seen for an appointment in the last year OR does the patient have an upcoming appointment? Yes.    Agent: Please be advised that RX refills may take up to 3 business days. We ask that you follow-up with your pharmacy.

## 2020-12-14 NOTE — Telephone Encounter (Incomplete Revision)
Patient states she is requesting more that 2 hours/ day from [facility].  She feels that she is growing weaker and cannot move around as she used to d/t leg and back pain. Whole body is effected with pain. Cooking and cleaning is more challenging.  She does not have a wheelchair.    Patient states she is not opposed to PT/OT.  She would also consider pain management.    Patient became tearful because she has not had medication x 3 days. She has increase incontinence and GI sx's since being off Bentyl for several days.   Is it possible to have Rx switched over to a pharmacy that delivers medications and incontinence supplies.

## 2020-12-14 NOTE — Telephone Encounter (Signed)
Patient called asking about how many hours she has received for nurse aid care. Please call back

## 2020-12-14 NOTE — Telephone Encounter (Addendum)
Patient states she is requesting more that 2 hours/ day from PCS.  She feels that she is growing weaker and cannot move around as she used to d/t leg and back pain. Whole body is effected with pain. Cooking and cleaning is more challenging.  She does not have a wheelchair.    Patient states she is not opposed to PT/OT.  She would also consider pain management.    Patient became tearful because she has not had medication x 3 days. She has increase incontinence and GI sx's since being off Bentyl for several days.   Is it possible to have Rx switched over to a pharmacy that delivers medications and incontinence supplies.  

## 2020-12-14 NOTE — Telephone Encounter (Signed)
Patient called back.  Informed that request from previous conversation are still pending.

## 2020-12-14 NOTE — Telephone Encounter (Signed)
I just spoke to the patient.   Explained to her that a referral for increasing PCS hours will be sent to [facility] but there is no guarantee that the will approve the increase in hours. She said that she has contacted [employer] and has already chosen a new agency to provide the [employer].  She inquired about the power wheelchair.  Explained to her that the order was sent to [facility] 12/08/2020. she has already been denied by [place]. Provided her with the phone number for [employer] # [id number] to call and check on status of order.  [name] requesting home health PT and OT.  Explained to her that if Dr Margarita Rana is in agreement, a referral can be sent to the home health agency of her choice, [facility].  If they are not able to accept the referral this CM will contact other home health agencies but there is no guarantee that another agency will be able to accept the referral.  She was in agreement to having her prescriptions sent to [facility] so they can be delivered. Other options for delivery are [facility], [facility] but she decided to use [facility].  Regarding incontinence supplies, she requests adult pullups - size medium and wipes.  She was agreeable to sending a referral to [facility].

## 2020-12-15 ENCOUNTER — Other Ambulatory Visit: Payer: Self-pay

## 2020-12-15 ENCOUNTER — Ambulatory Visit: Payer: 59 | Attending: Family Medicine | Admitting: Clinical

## 2020-12-15 DIAGNOSIS — F250 Schizoaffective disorder, bipolar type: Secondary | ICD-10-CM | POA: Diagnosis not present

## 2020-12-15 NOTE — Telephone Encounter (Signed)
Noted.  I will wait for referral from Maren Reamer

## 2020-12-15 NOTE — Telephone Encounter (Signed)
Completed request for an increase in PCS hours faxed to [facility]

## 2020-12-15 NOTE — Telephone Encounter (Signed)
I have completed her form and referred her for PT, OT.

## 2020-12-16 ENCOUNTER — Encounter (HOSPITAL_COMMUNITY): Payer: Self-pay | Admitting: Emergency Medicine

## 2020-12-16 ENCOUNTER — Other Ambulatory Visit: Payer: Self-pay | Admitting: Family Medicine

## 2020-12-16 ENCOUNTER — Emergency Department (HOSPITAL_COMMUNITY)
Admission: EM | Admit: 2020-12-16 | Discharge: 2020-12-17 | Disposition: A | Discharge status: Home / Self Care | Payer: 59 | Attending: Emergency Medicine | Admitting: Emergency Medicine

## 2020-12-16 DIAGNOSIS — I130 Hypertensive heart and chronic kidney disease with heart failure and stage 1 through stage 4 chronic kidney disease, or unspecified chronic kidney disease: Secondary | ICD-10-CM | POA: Diagnosis not present

## 2020-12-16 DIAGNOSIS — J42 Unspecified chronic bronchitis: Secondary | ICD-10-CM

## 2020-12-16 DIAGNOSIS — N183 Chronic kidney disease, stage 3 unspecified: Secondary | ICD-10-CM | POA: Insufficient documentation

## 2020-12-16 DIAGNOSIS — F250 Schizoaffective disorder, bipolar type: Secondary | ICD-10-CM | POA: Insufficient documentation

## 2020-12-16 DIAGNOSIS — Y900 Blood alcohol level of less than 20 mg/100 ml: Secondary | ICD-10-CM | POA: Insufficient documentation

## 2020-12-16 DIAGNOSIS — F22 Delusional disorders: Secondary | ICD-10-CM

## 2020-12-16 DIAGNOSIS — Z7952 Long term (current) use of systemic steroids: Secondary | ICD-10-CM | POA: Insufficient documentation

## 2020-12-16 DIAGNOSIS — R197 Diarrhea, unspecified: Secondary | ICD-10-CM

## 2020-12-16 DIAGNOSIS — Z87891 Personal history of nicotine dependence: Secondary | ICD-10-CM | POA: Diagnosis not present

## 2020-12-16 DIAGNOSIS — J45909 Unspecified asthma, uncomplicated: Secondary | ICD-10-CM | POA: Diagnosis not present

## 2020-12-16 DIAGNOSIS — E1122 Type 2 diabetes mellitus with diabetic chronic kidney disease: Secondary | ICD-10-CM | POA: Insufficient documentation

## 2020-12-16 DIAGNOSIS — Z20822 Contact with and (suspected) exposure to covid-19: Secondary | ICD-10-CM | POA: Insufficient documentation

## 2020-12-16 DIAGNOSIS — Z79899 Other long term (current) drug therapy: Secondary | ICD-10-CM | POA: Insufficient documentation

## 2020-12-16 DIAGNOSIS — M109 Gout, unspecified: Secondary | ICD-10-CM

## 2020-12-16 DIAGNOSIS — J449 Chronic obstructive pulmonary disease, unspecified: Secondary | ICD-10-CM | POA: Diagnosis not present

## 2020-12-16 DIAGNOSIS — I5032 Chronic diastolic (congestive) heart failure: Secondary | ICD-10-CM | POA: Diagnosis not present

## 2020-12-16 DIAGNOSIS — Z046 Encounter for general psychiatric examination, requested by authority: Secondary | ICD-10-CM | POA: Diagnosis present

## 2020-12-16 DIAGNOSIS — I10 Essential (primary) hypertension: Secondary | ICD-10-CM

## 2020-12-16 LAB — RESP PANEL BY RT-PCR (FLU A&B, COVID) ARPGX2
Influenza A by PCR: NEGATIVE
Influenza B by PCR: NEGATIVE
SARS Coronavirus 2 by RT PCR: NEGATIVE

## 2020-12-16 LAB — CBC WITH DIFFERENTIAL/PLATELET
Abs Immature Granulocytes: 0.02 10*3/uL (ref 0.00–0.07)
Basophils Absolute: 0.1 10*3/uL (ref 0.0–0.1)
Basophils Relative: 1 %
Eosinophils Absolute: 0.2 10*3/uL (ref 0.0–0.5)
Eosinophils Relative: 4 %
HCT: 34.1 % — ABNORMAL LOW (ref 36.0–46.0)
Hemoglobin: 10.3 g/dL — ABNORMAL LOW (ref 12.0–15.0)
Immature Granulocytes: 0 %
Lymphocytes Relative: 31 %
Lymphs Abs: 1.8 10*3/uL (ref 0.7–4.0)
MCH: 28.5 pg (ref 26.0–34.0)
MCHC: 30.2 g/dL (ref 30.0–36.0)
MCV: 94.2 fL (ref 80.0–100.0)
Monocytes Absolute: 0.4 10*3/uL (ref 0.1–1.0)
Monocytes Relative: 6 %
Neutro Abs: 3.5 10*3/uL (ref 1.7–7.7)
Neutrophils Relative %: 58 %
Platelets: 401 10*3/uL — ABNORMAL HIGH (ref 150–400)
RBC: 3.62 MIL/uL — ABNORMAL LOW (ref 3.87–5.11)
RDW: 15.5 % (ref 11.5–15.5)
WBC: 6 10*3/uL (ref 4.0–10.5)
nRBC: 0 % (ref 0.0–0.2)

## 2020-12-16 LAB — COMPREHENSIVE METABOLIC PANEL
ALT: 39 U/L (ref 0–44)
AST: 34 U/L (ref 15–41)
Albumin: 3.7 g/dL (ref 3.5–5.0)
Alkaline Phosphatase: 151 U/L — ABNORMAL HIGH (ref 38–126)
Anion gap: 12 (ref 5–15)
BUN: 17 mg/dL (ref 8–23)
CO2: 28 mmol/L (ref 22–32)
Calcium: 9.4 mg/dL (ref 8.9–10.3)
Chloride: 101 mmol/L (ref 98–111)
Creatinine, Ser: 1.76 mg/dL — ABNORMAL HIGH (ref 0.44–1.00)
GFR, Estimated: 33 mL/min — ABNORMAL LOW (ref >60)
Glucose, Bld: 92 mg/dL (ref 70–99)
Potassium: 3.1 mmol/L — ABNORMAL LOW (ref 3.5–5.1)
Sodium: 141 mmol/L (ref 135–145)
Total Bilirubin: 0.5 mg/dL (ref 0.3–1.2)
Total Protein: 6.5 g/dL (ref 6.5–8.1)

## 2020-12-16 LAB — RAPID URINE DRUG SCREEN, HOSP PERFORMED
Amphetamines: NOT DETECTED
Barbiturates: NOT DETECTED
Benzodiazepines: POSITIVE — AB
Cocaine: NOT DETECTED
Opiates: NOT DETECTED
Tetrahydrocannabinol: NOT DETECTED

## 2020-12-16 LAB — ACETAMINOPHEN LEVEL: Acetaminophen (Tylenol), Serum: <10 ug/mL — ABNORMAL LOW (ref 10–30)

## 2020-12-16 LAB — SALICYLATE LEVEL: Salicylate Lvl: <7 mg/dL — ABNORMAL LOW (ref 7.0–30.0)

## 2020-12-16 LAB — ETHANOL: Alcohol, Ethyl (B): <10 mg/dL (ref <10)

## 2020-12-16 MED ORDER — MISC. DEVICES MISC: 11 refills | Status: DC

## 2020-12-16 MED ORDER — PRAZOSIN HCL 2 MG PO CAPS
2.0000 mg | ORAL_CAPSULE | Freq: Every day | ORAL | Status: DC
  Administered 2020-12-16: 2 mg via ORAL
  Filled 2020-12-16 (×2): qty 1

## 2020-12-16 MED ORDER — ALBUTEROL SULFATE (2.5 MG/3ML) 0.083% IN NEBU: 2.5000 mg | INHALATION_SOLUTION | Freq: Four times a day (QID) | RESPIRATORY_TRACT | Status: DC | PRN

## 2020-12-16 MED ORDER — OXYBUTYNIN CHLORIDE 5 MG PO TABS
5.0000 mg | ORAL_TABLET | Freq: Two times a day (BID) | ORAL | Status: DC
  Administered 2020-12-16 – 2020-12-17 (×2): 5 mg via ORAL
  Filled 2020-12-16 (×2): qty 1

## 2020-12-16 MED ORDER — DICYCLOMINE HCL 10 MG PO CAPS
10.0000 mg | ORAL_CAPSULE | Freq: Three times a day (TID) | ORAL | Status: DC
  Administered 2020-12-17: 10 mg via ORAL
  Filled 2020-12-16: qty 1

## 2020-12-16 MED ORDER — MONTELUKAST SODIUM 10 MG PO TABS
10.0000 mg | ORAL_TABLET | Freq: Every day | ORAL | Status: DC
  Administered 2020-12-16: 10 mg via ORAL
  Filled 2020-12-16 (×2): qty 1

## 2020-12-16 MED ORDER — CYCLOBENZAPRINE HCL 10 MG PO TABS: 10.0000 mg | ORAL_TABLET | Freq: Two times a day (BID) | ORAL | 6 refills | Status: DC | PRN

## 2020-12-16 MED ORDER — LISDEXAMFETAMINE DIMESYLATE 20 MG PO CAPS: 20.0000 mg | ORAL_CAPSULE | Freq: Every day | ORAL | Status: DC

## 2020-12-16 MED ORDER — ADULT MULTIVITAMIN W/MINERALS CH
1.0000 | ORAL_TABLET | Freq: Every day | ORAL | Status: DC
  Administered 2020-12-16 – 2020-12-17 (×2): 1 via ORAL
  Filled 2020-12-16 (×2): qty 1

## 2020-12-16 MED ORDER — PANTOPRAZOLE SODIUM 40 MG PO TBEC
40.0000 mg | DELAYED_RELEASE_TABLET | Freq: Every day | ORAL | Status: DC
  Administered 2020-12-16 – 2020-12-17 (×2): 40 mg via ORAL
  Filled 2020-12-16 (×2): qty 1

## 2020-12-16 MED ORDER — THIAMINE HCL 100 MG PO TABS
100.0000 mg | ORAL_TABLET | Freq: Every day | ORAL | Status: DC
  Administered 2020-12-16 – 2020-12-17 (×2): 100 mg via ORAL
  Filled 2020-12-16 (×2): qty 1

## 2020-12-16 MED ORDER — ATORVASTATIN CALCIUM 10 MG PO TABS
20.0000 mg | ORAL_TABLET | Freq: Every day | ORAL | Status: DC
  Administered 2020-12-16 – 2020-12-17 (×2): 20 mg via ORAL
  Filled 2020-12-16 (×2): qty 2

## 2020-12-16 MED ORDER — FUROSEMIDE 20 MG PO TABS
40.0000 mg | ORAL_TABLET | Freq: Every day | ORAL | Status: DC
  Administered 2020-12-17: 40 mg via ORAL
  Filled 2020-12-16 (×3): qty 2

## 2020-12-16 MED ORDER — LISDEXAMFETAMINE DIMESYLATE 20 MG PO CAPS
20.0000 mg | ORAL_CAPSULE | Freq: Every day | ORAL | Status: DC
  Filled 2020-12-16: qty 1

## 2020-12-16 MED ORDER — COLCHICINE 0.6 MG PO TABS
0.6000 mg | ORAL_TABLET | Freq: Every day | ORAL | Status: DC
  Administered 2020-12-17: 0.6 mg via ORAL
  Filled 2020-12-16: qty 1

## 2020-12-16 MED ORDER — ALBUTEROL SULFATE HFA 108 (90 BASE) MCG/ACT IN AERS: 2.0000 | INHALATION_SPRAY | Freq: Four times a day (QID) | RESPIRATORY_TRACT | Status: DC | PRN

## 2020-12-16 MED ORDER — AMLODIPINE BESYLATE 5 MG PO TABS
5.0000 mg | ORAL_TABLET | Freq: Every day | ORAL | Status: DC
  Administered 2020-12-16 – 2020-12-17 (×2): 5 mg via ORAL
  Filled 2020-12-16 (×2): qty 1

## 2020-12-16 MED ORDER — MONTELUKAST SODIUM 10 MG PO TABS: 10.0000 mg | ORAL_TABLET | Freq: Every day | ORAL | 6 refills | Status: DC

## 2020-12-16 MED ORDER — DICYCLOMINE HCL 10 MG PO CAPS: 10.0000 mg | ORAL_CAPSULE | Freq: Three times a day (TID) | ORAL | 6 refills | Status: DC

## 2020-12-16 MED ORDER — POTASSIUM CHLORIDE CRYS ER 20 MEQ PO TBCR
40.0000 meq | EXTENDED_RELEASE_TABLET | Freq: Two times a day (BID) | ORAL | Status: DC
  Administered 2020-12-16 – 2020-12-17 (×2): 40 meq via ORAL
  Filled 2020-12-16 (×2): qty 2

## 2020-12-16 MED ORDER — ALBUTEROL SULFATE (2.5 MG/3ML) 0.083% IN NEBU: 2.5000 mg | INHALATION_SOLUTION | Freq: Four times a day (QID) | RESPIRATORY_TRACT | 5 refills | Status: DC | PRN

## 2020-12-16 MED ORDER — ALBUTEROL SULFATE HFA 108 (90 BASE) MCG/ACT IN AERS: 2.0000 | INHALATION_SPRAY | Freq: Four times a day (QID) | RESPIRATORY_TRACT | 5 refills | Status: DC | PRN

## 2020-12-16 MED ORDER — CYCLOBENZAPRINE HCL 10 MG PO TABS: 10.0000 mg | ORAL_TABLET | Freq: Two times a day (BID) | ORAL | Status: DC | PRN

## 2020-12-16 MED ORDER — COLCHICINE 0.6 MG PO TABS: 0.6000 mg | ORAL_TABLET | Freq: Every day | ORAL | Status: DC

## 2020-12-16 MED ORDER — TRELEGY ELLIPTA 100-62.5-25 MCG/ACT IN AEPB: 1.0000 | INHALATION_SPRAY | Freq: Every day | RESPIRATORY_TRACT | 6 refills | Status: DC

## 2020-12-16 MED ORDER — PRAZOSIN HCL 2 MG PO CAPS
2.0000 mg | ORAL_CAPSULE | Freq: Every day | ORAL | 0 refills | Status: DC
Start: 2020-12-16 — End: 2021-01-14

## 2020-12-16 MED ORDER — GABAPENTIN 600 MG PO TABS
600.0000 mg | ORAL_TABLET | Freq: Two times a day (BID) | ORAL | Status: DC
  Administered 2020-12-16 – 2020-12-17 (×2): 600 mg via ORAL
  Filled 2020-12-16 (×3): qty 1

## 2020-12-16 MED ORDER — AMLODIPINE BESYLATE 5 MG PO TABS: 5.0000 mg | ORAL_TABLET | Freq: Every day | ORAL | 6 refills | Status: DC

## 2020-12-16 MED ORDER — COLCHICINE 0.6 MG PO TABS: ORAL_TABLET | ORAL | 6 refills | Status: DC

## 2020-12-16 MED ORDER — ESCITALOPRAM OXALATE 10 MG PO TABS: 10.0000 mg | ORAL_TABLET | Freq: Every day | ORAL | 0 refills | Status: DC

## 2020-12-16 MED ORDER — POTASSIUM CHLORIDE CRYS ER 20 MEQ PO TBCR: 40.0000 meq | EXTENDED_RELEASE_TABLET | Freq: Two times a day (BID) | ORAL | 3 refills | Status: DC

## 2020-12-16 NOTE — Telephone Encounter (Signed)
Home health referral faxed to [facility] for review 

## 2020-12-16 NOTE — Telephone Encounter (Signed)
Call received from [place] stating that they are not in network with patient's insurance and are not able to accept the referral.

## 2020-12-16 NOTE — ED Notes (Signed)
IVC paperwork done and handed to nurse

## 2020-12-16 NOTE — BH Assessment (Addendum)
TTS completed. Discussed clinicals with [facility] provider Lindon Romp, NP) and patient does not meet criteria for inpatient psychiatric treatment. [name] determined to be at [facility] based on current presentation and psychiatric hx noted in Epic. Please rescind IVC and discharge home. [name] recommended to continue following up with her outpatient providers (therapist) and (psychiatrist).    Patient's nurse Herbie Baltimore, RN), provided disposition updates. Also, EDP (Dr. Zenia Resides) notified of patient's disposition. Both persons notified via secure chat.    Clinician contacted EDP (Dr. Zenia Resides) by phone to discuss patient's disposition as noted above. After further discussion with EDP, patient to remain under supervision in the ED, for overnight observation until collateral information is sought. Discussed with Lindon Romp, NP who is also agreeable to overnight observation, pending collateral information.

## 2020-12-16 NOTE — ED Notes (Signed)
Counselor advised RN that patient did not meet criteria for behavior health placement and patient can be discharge home , Dr. Zenia Resides notified , EDP would like to speak with the counselor on this matter . Counsellor notified by RN .

## 2020-12-16 NOTE — ED Triage Notes (Signed)
Patient brought in by police after being IVC'd at a facility on [facility] after the patient made homicidal threats about her son [name] (who lives in another state) in the presence of the medical provider. Police state the patient told them "I will kill him in self-defense if he tries to enter my house".
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
## 2020-12-16 NOTE — Progress Notes (Signed)
Request from patient to send medications to [facility] due to the fact that [facility] delivers medication.  Refills have been sent.

## 2020-12-16 NOTE — ED Provider Notes (Signed)
Emergency Medicine Provider Triage Evaluation Note  Tiffany [name] , a 59 y.o. female  was evaluated in triage.  Pt complains of under [employer] IVC.  Patient was IVC yesterday over 24 hours ago at her PCPs office, they filled out paperwork which was faxed.  Patient went to her house, where today she was moved from by [employer].  According to statements, however nothing in writing "patient had some homicidal ideations ". Patient is here voluntarily.   Review of Systems  Positive: HI Negative: Fever, chest pain, shortness of breath  Physical Exam  BP (!) 191/118 (BP Location: Left Arm)   Pulse 92   Temp 99.4 F (37.4 C)   Resp 14   SpO2 96%  Gen:   Awake, no distress   Resp:  Normal effort  MSK:   Moves extremities without difficulty Other:   Medical Decision Making  Medically screening exam initiated at 1:24 PM.  Appropriate orders placed.  [name] was informed that the remainder of the evaluation will be completed by another provider, this initial triage assessment does not replace that evaluation, and the importance of remaining in the ED until their evaluation is complete.  IVC paperwork was NOT filled out completely, no records of statements and no 24 hour exam done in 24 hours. [employer] states magister states "the paperwork is sufficient". However, no statements were filed.   Medical clearance labs ordered.    Tiffany Fitting, PA-C 12/16/20 [place], DO 12/17/20 1018

## 2020-12-16 NOTE — ED Notes (Signed)
TTS interview in progress.  

## 2020-12-17 ENCOUNTER — Telehealth: Payer: Self-pay | Admitting: Clinical

## 2020-12-17 NOTE — ED Notes (Signed)
Per counselor patient will also remain in the ED for overnight observation, pending collateral information from her therapist. Counselor have discussed with the [facility] and [facility] provider.

## 2020-12-17 NOTE — ED Provider Notes (Signed)
Patient was evaluated by TTS and found not to need inpatient psychiatric care.  Felt to be at her baseline.  Recommended resending IVC and discharging home.   Deno Etienne, DO 12/17/20 [phone]

## 2020-12-17 NOTE — Care Management (Signed)
RNCM met patient are bedside. Pt. thought her meds had been lost during her ride to ED. Informed pt. her medications will be delivered from [facility] to her home.  No additional needs.

## 2020-12-17 NOTE — ED Notes (Signed)
IVC paperwork faxed, [phone], to counselor by secretary
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
## 2020-12-17 NOTE — ED Notes (Signed)
Discussed plan of care with [place] after morning rounds. She stated that pt needed no further collateral information from behavioral health team and is able to be discharged. IVC rescinded and pt given back her belongings.
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
## 2020-12-17 NOTE — ED Notes (Addendum)
All pt's belongings inventoried and placed in [place] [address]. Pt changed into burgundy scrubs.
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
## 2020-12-17 NOTE — Discharge Instructions (Signed)
Follow up with your doctor

## 2020-12-17 NOTE — ED Notes (Signed)
Provided pt bag lunch and beverage
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
## 2020-12-21 ENCOUNTER — Other Ambulatory Visit: Payer: Self-pay | Admitting: Family Medicine

## 2020-12-21 ENCOUNTER — Telehealth: Payer: Self-pay | Admitting: Family Medicine

## 2020-12-21 ENCOUNTER — Other Ambulatory Visit: Payer: Self-pay | Admitting: *Deleted

## 2020-12-21 DIAGNOSIS — R197 Diarrhea, unspecified: Secondary | ICD-10-CM

## 2020-12-21 MED ORDER — DICLOFENAC SODIUM 75 MG PO TBEC: DELAYED_RELEASE_TABLET | ORAL | 0 refills | Status: DC

## 2020-12-21 NOTE — Telephone Encounter (Signed)
Copied from [place] [phone]. Topic: Quick Communication - Rx Refill/Question >> Dec 21, 2020  4:34 PM Tessa Lerner A wrote: Medication: dicyclomine (BENTYL) 10 MG capsule [[id number]]   Has the patient contacted their pharmacy? Yes.  The patient has contacted their pharmacy and been directed to contact their PCP (Agent: If no, request that the patient contact the pharmacy for the refill. If patient does not wish to contact the pharmacy document the reason why and proceed with request.) (Agent: If yes, when and what did the pharmacy advise?)  Preferred Pharmacy (with phone number or street name): [place] [place]  Phone:  [phone] Fax:  [phone]  Has the patient been seen for an appointment in the last year OR does the patient have an upcoming appointment? Yes.    Agent: Please be advised that RX refills may take up to 3 business days. We ask that you follow-up with your pharmacy.

## 2020-12-21 NOTE — Telephone Encounter (Signed)
Requested Prescriptions  Pending Prescriptions Disp Refills  . diclofenac (VOLTAREN) 75 MG EC tablet 60 tablet 0    Sig: TAKE 1 TABLET(75 MG) BY MOUTH TWICE DAILY AS NEEDED Strength: 75 mg     Analgesics:  NSAIDS Failed - 12/21/2020  4:59 PM      Failed - Cr in normal range and within 360 days    Creat  Date Value Ref Range Status  02/18/2016 1.22 (H) 0.50 - 1.05 mg/dL Final    Comment:      For patients > or = 61 years of age: The upper reference limit for Creatinine is approximately 13% higher for people identified as African-American.      Creatinine, Ser  Date Value Ref Range Status  12/16/2020 1.76 (H) 0.44 - 1.00 mg/dL Final   Creatinine,U  Date Value Ref Range Status  06/17/2012 521.94 mg/dL Final    Comment:    Result confirmed by automatic dilution.    Cutoff Values for Urine Drug Screen, Pain Mgmt          Drug Class           Cutoff (ng/mL)          Amphetamines             500          Barbiturates             200          Cocaine Metabolites      150          Benzodiazepines          200          Methadone                300          Opiates                  300          Phencyclidine             25          Propoxyphene             300          Marijuana Metabolites     50    For medical purposes only.   Creatinine, Urine  Date Value Ref Range Status  02/19/2020 300.84 mg/dL Final    Comment:    Performed at [facility], [place] [address] [place], [place] [address]         Failed - HGB in normal range and within 360 days    Hemoglobin  Date Value Ref Range Status  12/16/2020 10.3 (L) 12.0 - 15.0 g/dL Final  11/25/2019 9.0 (L) 11.1 - 15.9 g/dL Final         Passed - Patient is not pregnant      Passed - Valid encounter within last 12 months    Recent Outpatient Visits          2 weeks ago Adnexal cyst   [place], Charlane Ferretti, MD   1 month ago Neuropathy   [place], [place]   2 months ago Diarrhea, unspecified type   [place], Enobong, MD   3 months ago Tinea pedis of both feet   [place], Anderson,  MD   3 months ago Diarrhea, unspecified type   [place], Enobong, MD      Future Appointments            In 2 weeks Gilford Rile, Martie Lee, NP [facility]

## 2020-12-21 NOTE — Telephone Encounter (Signed)
Patient calling regarding medication refill for docyclomine 10 mg to be expedited tonight due to pharmacy will deliver per agent. Patient put NT on hold and unable to continue to hold for patient  due to wait time.

## 2020-12-21 NOTE — Telephone Encounter (Signed)
Call placed to [facility], spoke to [facility] who said that the referral can be faxed for review but if accepted there would be a delay in start of care for PT and OT.  She requested an order noting delayed start of care is acceptable. Referral can be faxed to # [phone].  Call placed to [facility], message left for Angie Coley/marketing informing her of referral and requesting a call back to discuss.  Call placed to [facility], spoke to [facility] who requested referral be faxed for review to # [phone]. She said that there is no delay with PT but there may be a delayed start of care with OT. She said they will verify insurance coverage and will let this CM know if they are not able to accept it.   Referral then faxed to [facility] as requested.

## 2020-12-21 NOTE — Telephone Encounter (Signed)
[facility] notified in another encounter, spoke to Christy Sartorius, [facility] about the Bentyl request. He says it's at the pharmacy ready for pick up. I advised the patient wants it delivered, he says it will be tomorrow due to no driver today.

## 2020-12-21 NOTE — Telephone Encounter (Signed)
[facility] called and stated that the pt called them and requested a 75MG  medication that is for her stomach and it starts with the letter D/ pharmacist was not sure and stated they didn't have any medication in their system that resembled this/ please advise / pt maybe talking about diclofenac (VOLTAREN) 75 MG  /please send 30 day supply once confirmed

## 2020-12-21 NOTE — Telephone Encounter (Signed)
Patient called to ask about her refill request. She says it's the medication she takes for her stomach. I asked is it Dicyclomine that she takes TID before meals, she says yes it is and the pharmacy is saying they need a refill. I advised it was sent on 12/16/20 and I'll call to verify. She says she needs the medicine for her legs hurting, Diclofenac, the pharmacy doesn't have that on file. [facility] called and spoke to Christy Sartorius, [facility] about the medications above. He says they have dicyclomine, but not diclofenac. I advised the patient would like them delivered today, he says it will be tomorrow due to no driver, so if we send in the diclofenac today it will go out tomorrow.

## 2020-12-22 NOTE — Telephone Encounter (Signed)
Call received from [facility] who stated that they are not able to accept the referral

## 2020-12-23 NOTE — Telephone Encounter (Signed)
Pt needs to know who should be calling on her wheelchair request? [phone]

## 2020-12-23 NOTE — Telephone Encounter (Signed)
Referral faxed to [facility] for review

## 2020-12-24 ENCOUNTER — Telehealth: Payer: Self-pay | Admitting: Family Medicine

## 2020-12-24 MED ORDER — DICYCLOMINE HCL 10 MG PO CAPS: 10.0000 mg | ORAL_CAPSULE | Freq: Three times a day (TID) | ORAL | 6 refills | Status: DC

## 2020-12-24 NOTE — Telephone Encounter (Signed)
Copied from [place] [phone]. Topic: General - Other >> Dec 24, 2020  9:43 AM Tessa Lerner A wrote: Reason for CRM: The patient would like to know the name of the company that their wheelchair prescription was submitted to   Please contact further when possible

## 2020-12-26 ENCOUNTER — Emergency Department (HOSPITAL_COMMUNITY)
Admission: EM | Admit: 2020-12-26 | Discharge: 2020-12-26 | Disposition: A | Discharge status: Home / Self Care | Payer: 59 | Attending: Emergency Medicine | Admitting: Emergency Medicine

## 2020-12-26 ENCOUNTER — Emergency Department (HOSPITAL_BASED_OUTPATIENT_CLINIC_OR_DEPARTMENT_OTHER)
Admit: 2020-12-26 | Discharge: 2020-12-26 | Disposition: A | Discharge status: Home / Self Care | Payer: 59 | Attending: Emergency Medicine | Admitting: Emergency Medicine

## 2020-12-26 ENCOUNTER — Other Ambulatory Visit: Payer: Self-pay

## 2020-12-26 ENCOUNTER — Emergency Department (HOSPITAL_COMMUNITY): Payer: 59

## 2020-12-26 DIAGNOSIS — Z760 Encounter for issue of repeat prescription: Secondary | ICD-10-CM | POA: Insufficient documentation

## 2020-12-26 DIAGNOSIS — J45909 Unspecified asthma, uncomplicated: Secondary | ICD-10-CM | POA: Diagnosis not present

## 2020-12-26 DIAGNOSIS — M79605 Pain in left leg: Secondary | ICD-10-CM

## 2020-12-26 DIAGNOSIS — Z79899 Other long term (current) drug therapy: Secondary | ICD-10-CM | POA: Diagnosis not present

## 2020-12-26 DIAGNOSIS — R600 Localized edema: Secondary | ICD-10-CM | POA: Diagnosis not present

## 2020-12-26 DIAGNOSIS — E1140 Type 2 diabetes mellitus with diabetic neuropathy, unspecified: Secondary | ICD-10-CM | POA: Diagnosis not present

## 2020-12-26 DIAGNOSIS — M79604 Pain in right leg: Secondary | ICD-10-CM

## 2020-12-26 DIAGNOSIS — Z87891 Personal history of nicotine dependence: Secondary | ICD-10-CM | POA: Insufficient documentation

## 2020-12-26 DIAGNOSIS — J449 Chronic obstructive pulmonary disease, unspecified: Secondary | ICD-10-CM | POA: Insufficient documentation

## 2020-12-26 DIAGNOSIS — M109 Gout, unspecified: Secondary | ICD-10-CM

## 2020-12-26 DIAGNOSIS — J42 Unspecified chronic bronchitis: Secondary | ICD-10-CM

## 2020-12-26 DIAGNOSIS — I10 Essential (primary) hypertension: Secondary | ICD-10-CM

## 2020-12-26 DIAGNOSIS — I5032 Chronic diastolic (congestive) heart failure: Secondary | ICD-10-CM | POA: Insufficient documentation

## 2020-12-26 DIAGNOSIS — N183 Chronic kidney disease, stage 3 unspecified: Secondary | ICD-10-CM | POA: Insufficient documentation

## 2020-12-26 DIAGNOSIS — I130 Hypertensive heart and chronic kidney disease with heart failure and stage 1 through stage 4 chronic kidney disease, or unspecified chronic kidney disease: Secondary | ICD-10-CM | POA: Diagnosis not present

## 2020-12-26 DIAGNOSIS — M7989 Other specified soft tissue disorders: Secondary | ICD-10-CM | POA: Diagnosis present

## 2020-12-26 LAB — COMPREHENSIVE METABOLIC PANEL
ALT: 31 U/L (ref 0–44)
AST: 32 U/L (ref 15–41)
Albumin: 3.6 g/dL (ref 3.5–5.0)
Alkaline Phosphatase: 144 U/L — ABNORMAL HIGH (ref 38–126)
Anion gap: 11 (ref 5–15)
BUN: 13 mg/dL (ref 8–23)
CO2: 27 mmol/L (ref 22–32)
Calcium: 9 mg/dL (ref 8.9–10.3)
Chloride: 96 mmol/L — ABNORMAL LOW (ref 98–111)
Creatinine, Ser: 1.6 mg/dL — ABNORMAL HIGH (ref 0.44–1.00)
GFR, Estimated: 36 mL/min — ABNORMAL LOW (ref >60)
Glucose, Bld: 93 mg/dL (ref 70–99)
Potassium: 3.2 mmol/L — ABNORMAL LOW (ref 3.5–5.1)
Sodium: 134 mmol/L — ABNORMAL LOW (ref 135–145)
Total Bilirubin: 0.6 mg/dL (ref 0.3–1.2)
Total Protein: 6.7 g/dL (ref 6.5–8.1)

## 2020-12-26 LAB — CBC WITH DIFFERENTIAL/PLATELET
Abs Immature Granulocytes: 0.04 10*3/uL (ref 0.00–0.07)
Basophils Absolute: 0 10*3/uL (ref 0.0–0.1)
Basophils Relative: 1 %
Eosinophils Absolute: 0.3 10*3/uL (ref 0.0–0.5)
Eosinophils Relative: 5 %
HCT: 33.7 % — ABNORMAL LOW (ref 36.0–46.0)
Hemoglobin: 10.8 g/dL — ABNORMAL LOW (ref 12.0–15.0)
Immature Granulocytes: 1 %
Lymphocytes Relative: 22 %
Lymphs Abs: 1.4 10*3/uL (ref 0.7–4.0)
MCH: 29 pg (ref 26.0–34.0)
MCHC: 32 g/dL (ref 30.0–36.0)
MCV: 90.3 fL (ref 80.0–100.0)
Monocytes Absolute: 0.6 10*3/uL (ref 0.1–1.0)
Monocytes Relative: 9 %
Neutro Abs: 4 10*3/uL (ref 1.7–7.7)
Neutrophils Relative %: 62 %
Platelets: 394 10*3/uL (ref 150–400)
RBC: 3.73 MIL/uL — ABNORMAL LOW (ref 3.87–5.11)
RDW: 15.5 % (ref 11.5–15.5)
WBC: 6.4 10*3/uL (ref 4.0–10.5)
nRBC: 0 % (ref 0.0–0.2)

## 2020-12-26 LAB — BRAIN NATRIURETIC PEPTIDE: B Natriuretic Peptide: 81.5 pg/mL (ref 0.0–100.0)

## 2020-12-26 LAB — TROPONIN I (HIGH SENSITIVITY)
Troponin I (High Sensitivity): 14 ng/L (ref <18)
Troponin I (High Sensitivity): 14 ng/L (ref <18)

## 2020-12-26 MED ORDER — ESCITALOPRAM OXALATE 10 MG PO TABS: 10.0000 mg | ORAL_TABLET | Freq: Every day | ORAL | 0 refills | Status: DC

## 2020-12-26 MED ORDER — POTASSIUM CHLORIDE CRYS ER 20 MEQ PO TBCR
40.0000 meq | EXTENDED_RELEASE_TABLET | Freq: Once | ORAL | Status: AC
  Administered 2020-12-26: 40 meq via ORAL
  Filled 2020-12-26: qty 2

## 2020-12-26 MED ORDER — AMLODIPINE BESYLATE 5 MG PO TABS: 5.0000 mg | ORAL_TABLET | Freq: Every day | ORAL | 0 refills | Status: DC

## 2020-12-26 MED ORDER — LIDOCAINE 5 % EX PTCH: 1.0000 | MEDICATED_PATCH | CUTANEOUS | 1 refills | Status: DC

## 2020-12-26 MED ORDER — FUROSEMIDE 80 MG PO TABS: 80.0000 mg | ORAL_TABLET | Freq: Every day | ORAL | 0 refills | Status: DC

## 2020-12-26 MED ORDER — FUROSEMIDE 20 MG PO TABS
80.0000 mg | ORAL_TABLET | Freq: Once | ORAL | Status: AC
  Administered 2020-12-26: 80 mg via ORAL
  Filled 2020-12-26: qty 4

## 2020-12-26 MED ORDER — COLCHICINE 0.6 MG PO TABS: ORAL_TABLET | ORAL | 0 refills | Status: DC
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
## 2020-12-26 NOTE — ED Triage Notes (Signed)
Pt arrives via [facility] from home c/o bilateral leg pain, pt non-compliant with medications. Pt denies CP, SOB.   EMS last VS - 180/110, HR 92, 98% on RA, RR 14.

## 2020-12-26 NOTE — Progress Notes (Signed)
Bilateral lower extremity venous duplex completed. Refer to "CV Proc" under chart review to view preliminary results.  12/26/2020 9:55 AM Kelby Aline., MHA, RVT, RDCS, RDMS

## 2020-12-26 NOTE — Discharge Instructions (Addendum)
I have refilled your home medications.  Take as prescribed.  Follow-up with primary care provider tomorrow or the next day.  Return for new or worsening symptoms.

## 2020-12-27 NOTE — Telephone Encounter (Signed)
Spoke to [facility] who said that they are not able to accept the referral due to staffing.

## 2020-12-27 NOTE — Telephone Encounter (Signed)
Wyatt Haste, spoke to to Judson Roch who requested referral be faxed for review.  Referral was faxed as requested to # [phone].  [place], spoke to [place] who requested referral be faxed to # [phone].  Referral then faxed for review.

## 2020-12-27 NOTE — Telephone Encounter (Signed)
Routing to CM 

## 2020-12-27 NOTE — Telephone Encounter (Signed)
Attempted to contact patient#[phone]  to inform her that the order for the power wheelchair was sent to [employer] # [phone]. There was no option to leave a voicemail message

## 2020-12-28 ENCOUNTER — Other Ambulatory Visit: Payer: Self-pay | Admitting: Family Medicine

## 2020-12-28 MED ORDER — DICLOFENAC SODIUM 75 MG PO TBEC: DELAYED_RELEASE_TABLET | ORAL | 0 refills | Status: DC

## 2020-12-28 MED ORDER — DICLOFENAC SODIUM 1 % EX GEL: 4.0000 g | Freq: Four times a day (QID) | CUTANEOUS | 0 refills | Status: DC

## 2020-12-28 NOTE — Telephone Encounter (Signed)
Call placed to patient and informed her that the order for the power chair was sent to [facility] 12/08/2020. She then said that she has their phone number and will call them because they called her and then she went to the hospital.

## 2020-12-28 NOTE — Telephone Encounter (Signed)
Last RF on 12/21/20 of diclofenac  tablet was printed instead of electronically. [place] and was told pt wanted meds to go to [place] so they can be delivered to pt.  Requested Prescriptions  Pending Prescriptions Disp Refills   diclofenac (VOLTAREN) 75 MG EC tablet 60 tablet 0    Sig: TAKE 1 TABLET(75 MG) BY MOUTH TWICE DAILY AS NEEDED Strength: 75 mg     Analgesics:  NSAIDS Failed - 12/28/2020  9:30 AM      Failed - Cr in normal range and within 360 days    Creat  Date Value Ref Range Status  02/18/2016 1.22 (H) 0.50 - 1.05 mg/dL Final    Comment:      For patients > or = 61 years of age: The upper reference limit for Creatinine is approximately 13% higher for people identified as African-American.      Creatinine, Ser  Date Value Ref Range Status  12/26/2020 1.60 (H) 0.44 - 1.00 mg/dL Final   Creatinine,U  Date Value Ref Range Status  06/17/2012 521.94 mg/dL Final    Comment:    Result confirmed by automatic dilution.    Cutoff Values for Urine Drug Screen, Pain Mgmt          Drug Class           Cutoff (ng/mL)          Amphetamines             500          Barbiturates             200          Cocaine Metabolites      150          Benzodiazepines          200          Methadone                300          Opiates                  300          Phencyclidine             25          Propoxyphene             300          Marijuana Metabolites     50    For medical purposes only.   Creatinine, Urine  Date Value Ref Range Status  02/19/2020 300.84 mg/dL Final    Comment:    Performed at [facility], [place] [address] [place], [place] [address]          Failed - HGB in normal range and within 360 days    Hemoglobin  Date Value Ref Range Status  12/26/2020 10.8 (L) 12.0 - 15.0 g/dL Final  11/25/2019 9.0 (L) 11.1 - 15.9 g/dL Final          Passed - Patient is not pregnant      Passed - Valid encounter within last 12 months     Recent Outpatient Visits           3 weeks ago Adnexal cyst   [place], Charlane Ferretti, MD   1 month ago Neuropathy   [place] [facility], [place], [place]  2 months ago Diarrhea, unspecified type   [place], Enobong, MD   3 months ago Tinea pedis of both feet   [place], Charlane Ferretti, MD   3 months ago Diarrhea, unspecified type   [place], Charlane Ferretti, MD       Future Appointments             In 1 week Loel Dubonnet, NP [facility]             diclofenac Sodium (VOLTAREN) 1 % GEL 100 g 0    Sig: Apply 4 g topically 4 (four) times daily.     Analgesics:  Topicals Passed - 12/28/2020  9:30 AM      Passed - Valid encounter within last 12 months    Recent Outpatient Visits           3 weeks ago Adnexal cyst   [place], Enobong, MD   1 month ago Neuropathy   [place], [place]   2 months ago Diarrhea, unspecified type   [place], Enobong, MD   3 months ago Tinea pedis of both feet   [place], Enobong, MD   3 months ago Diarrhea, unspecified type   [place], Enobong, MD       Future Appointments             In 1 week Gilford Rile, Martie Lee, NP [facility], [facility]

## 2020-12-28 NOTE — Telephone Encounter (Signed)
Medication Refill - Medication: diclofenac Sodium (VOLTAREN) 1 % GEL and diclofenac (VOLTAREN) 75 MG EC tablet. Patient states medication is for her gout which is chronic  Has the patient contacted their pharmacy? Yes.    (Agent: If yes, when and what did the pharmacy advise?) Yes   Preferred Pharmacy (with phone number or street name):  [place], [place] Phone:  [phone]  Fax:  [phone]       Has the patient been seen for an appointment in the last year OR does the patient have an upcoming appointment? Yes.    Agent: Please be advised that RX refills may take up to 3 business days. We ask that you follow-up with your pharmacy.

## 2020-12-28 NOTE — Telephone Encounter (Signed)
This CM spoke to [facility] who said they are still reviewing the referral.  Spoke to [place] who stated that they are not able to accept the referral.  Call placed to [facility], spoke to [place] who confirmed they are in network with patient's insurance and referral can be faxed to # [phone] for review.   Referral then faxed as requested.

## 2020-12-28 NOTE — Telephone Encounter (Signed)
Call received from [facility] stating that they are declining the referral due to staffing.

## 2020-12-29 ENCOUNTER — Telehealth: Payer: Self-pay | Admitting: Family Medicine

## 2020-12-29 ENCOUNTER — Ambulatory Visit: Payer: Self-pay | Admitting: *Deleted

## 2020-12-29 ENCOUNTER — Ambulatory Visit: Payer: Medicaid Other | Admitting: Obstetrics and Gynecology

## 2020-12-29 NOTE — Telephone Encounter (Signed)
Tiffany [name] was called and a VM was left informing her to return phone call.
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
## 2020-12-29 NOTE — Telephone Encounter (Signed)
Call placed to patient and VM was left informing patient to return phone call. 

## 2020-12-29 NOTE — Telephone Encounter (Signed)
Copied from [place] [phone]. Topic: General - Call Back - No Documentation >> Dec 28, 2020  3:03 PM Erick Blinks wrote: Reason for CRM: Pt called for follow up on PT and Pain management request, she says she is contacting her insurance for options.

## 2020-12-29 NOTE — Telephone Encounter (Signed)
Return call from [place] with Adult Protective Services [phone] to request information regarding patient's recommendations for psych. Care. Requesting a call back .

## 2020-12-29 NOTE — Telephone Encounter (Signed)
Copied from [place] [phone]. Topic: General - Other >> Dec 29, 2020  8:48 AM Valere Dross wrote: Reason for CRM: Marsh Dolly a nurse for the pt called in in regards to the pt, needing a nurse to return her call at [phone].
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
## 2020-12-30 ENCOUNTER — Other Ambulatory Visit: Payer: Self-pay | Admitting: Family Medicine

## 2020-12-30 DIAGNOSIS — R197 Diarrhea, unspecified: Secondary | ICD-10-CM

## 2020-12-30 NOTE — Telephone Encounter (Signed)
Medication Refill - Medication: dicyclomine (BENTYL) 10 MG capsule  Has the patient contacted their pharmacy? Yes.    (Agent: If yes, when and what did the pharmacy advise?) Pharmacy told her that she has no more refills Preferred Pharmacy (with phone number or street name):  [facility], [place]  Phone:  [phone] Fax:  [phone]    Has the patient been seen for an appointment in the last year OR does the patient have an upcoming appointment? Yes.

## 2020-12-31 ENCOUNTER — Telehealth: Payer: Self-pay | Admitting: Family Medicine

## 2020-12-31 ENCOUNTER — Other Ambulatory Visit: Payer: Self-pay | Admitting: Family Medicine

## 2020-12-31 NOTE — Telephone Encounter (Signed)
Copied from [place] [phone]. Topic: General - Other >> Dec 31, 2020 11:36 AM Yvette Rack wrote: Reason for CRM: Pt requests to change her home health aid service to [facility]

## 2020-12-31 NOTE — Telephone Encounter (Signed)
Medication Refill - Medication: asenapine (SAPHRIS) 5 MG SUBL 24 hr tablet  Has the patient contacted their pharmacy? No. ([name] witching this to [employer]  Preferred Pharmacy (with phone number or street name): [place], [place]  Has the patient been seen for an appointment in the last year OR does the patient have an upcoming appointment? Yes.    Agent: Please be advised that RX refills may take up to 3 business days. We ask that you follow-up with your pharmacy.

## 2020-12-31 NOTE — Telephone Encounter (Signed)
Please follow up with patient.

## 2020-12-31 NOTE — Telephone Encounter (Signed)
Copied from [place] [phone]. Topic: Referral - Request for Referral >> Dec 30, 2020  3:50 PM [place], [employer] R wrote: Has patient seen PCP for this complaint? No. *If NO, is insurance requiring patient see PCP for this issue before PCP can refer them? Referral for which specialty: Behavioral Health/ Psychiatrist  Preferred provider/office: n/a Reason for referral: Patient stated that she needs one.

## 2020-12-31 NOTE — Telephone Encounter (Signed)
Copied from [place] [phone]. Topic: General - Other >> Dec 31, 2020  2:29 PM Tessa Lerner A wrote: Reason for CRM: The patient has called to request a stat referral to [employer] in [facility] for their wheelchair fitting   The patient has been scheduled for 01/05/21 at 11:30 but has been told that an additional referral is required to keep their appointment for their fitting   Please contact [employer] at [phone] Raquel Sarna) as well as the patient when possible

## 2020-12-31 NOTE — Telephone Encounter (Signed)
Tiffany [name],  What needs to be done for patient to change her service.

## 2021-01-01 NOTE — Telephone Encounter (Signed)
duplicate request for refill- med was refilled 12/24/20 #90 [id number] RF- Pharmacy is closed until Monday. No action needed.  Requested Prescriptions  Refused Prescriptions Disp Refills   dicyclomine (BENTYL) 10 MG capsule 90 capsule 6    Sig: Take 1 capsule (10 mg total) by mouth 3 (three) times daily before meals.     Gastroenterology:  Antispasmodic Agents Passed - 12/31/2020  3:33 PM      Passed - Last Heart Rate in normal range    Pulse Readings from Last 1 Encounters:  12/26/20 83         Passed - Valid encounter within last 12 months    Recent Outpatient Visits          1 month ago Adnexal cyst   [place], Charlane Ferretti, MD   1 month ago Neuropathy   [place], [place]   2 months ago Diarrhea, unspecified type   [place], Enobong, MD   3 months ago Tinea pedis of both feet   [place], Enobong, MD   4 months ago Diarrhea, unspecified type   [place], Enobong, MD      Future Appointments            In 5 days Loel Dubonnet, NP [facility], [facility]

## 2021-01-01 NOTE — Telephone Encounter (Signed)
Requested medication (s) are on the active medication list: yes  Notes to clinic:  prescriber not in this practice, This medication can not be delegated, please assess.    Requested Prescriptions  Pending Prescriptions Disp Refills   asenapine (SAPHRIS) 5 MG SUBL 24 hr tablet 60 tablet 0    Sig: Place 2 tablets (10 mg total) under the tongue 2 (two) times daily.     Not Delegated - Psychiatry:  Antipsychotics - Second Generation (Atypical) - asenapine Failed - 12/31/2020  3:36 PM      Failed - This refill cannot be delegated      Failed - HCT in normal range and within 180 days    HCT  Date Value Ref Range Status  12/26/2020 33.7 (L) 36.0 - 46.0 % Final   Hematocrit  Date Value Ref Range Status  11/25/2019 28.2 (L) 34.0 - 46.6 % Final          Failed - HGB in normal range and within 180 days    Hemoglobin  Date Value Ref Range Status  12/26/2020 10.8 (L) 12.0 - 15.0 g/dL Final  11/25/2019 9.0 (L) 11.1 - 15.9 g/dL Final          Passed - WBC in normal range and within 180 days    WBC  Date Value Ref Range Status  12/26/2020 6.4 4.0 - 10.5 K/uL Final          Passed - PLT in normal range and within 360 days    Platelets  Date Value Ref Range Status  12/26/2020 394 150 - 400 K/uL Final  11/25/2019 375 150 - 450 x10E3/uL Final          Passed - Valid encounter within last 6 months    Recent Outpatient Visits           1 month ago Adnexal cyst   [place], Charlane Ferretti, MD   1 month ago Neuropathy   [place], Cari S, [place]   2 months ago Diarrhea, unspecified type   [place], Charlane Ferretti, MD   3 months ago Tinea pedis of both feet   [place], Charlane Ferretti, MD   4 months ago Diarrhea, unspecified type   [place], Enobong, MD       Future Appointments             In 5 days  Gilford Rile, Martie Lee, NP [facility], [facility]

## 2021-01-03 ENCOUNTER — Telehealth: Payer: Self-pay | Admitting: Family Medicine

## 2021-01-03 IMAGING — DX DG RIBS W/ CHEST 3+V*L*
3 series · 3 of 3 positions shown · non-contrast
Comparison: 12/25/2017, CT chest 11/03/2016

CLINICAL DATA: Left-sided rib pain after fall

EXAM:
LEFT RIBS AND CHEST - 3+ VIEW

[chest pa]
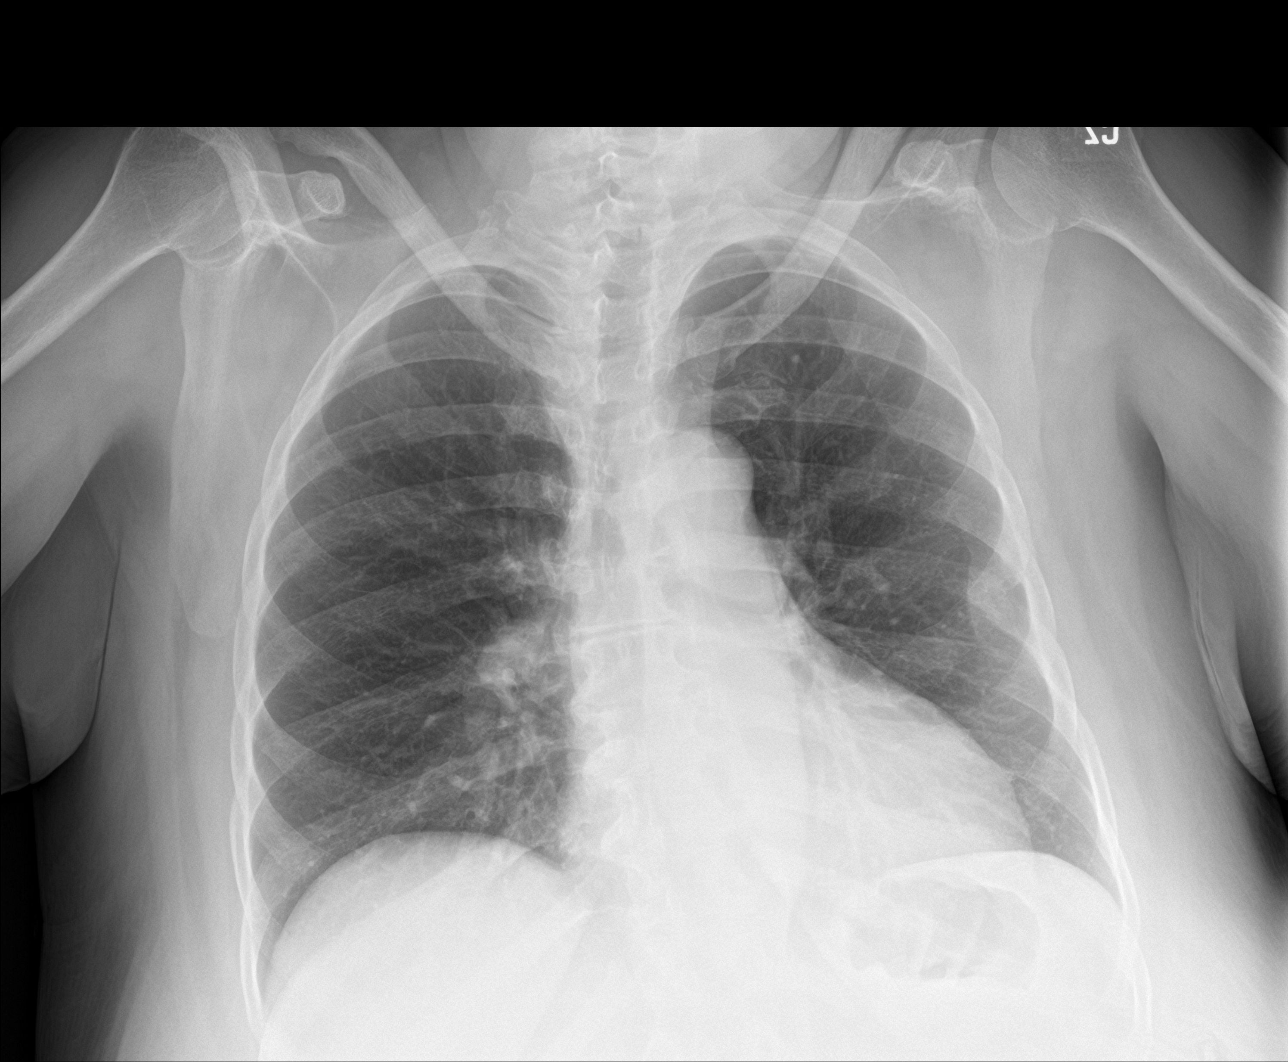

[rib obl]
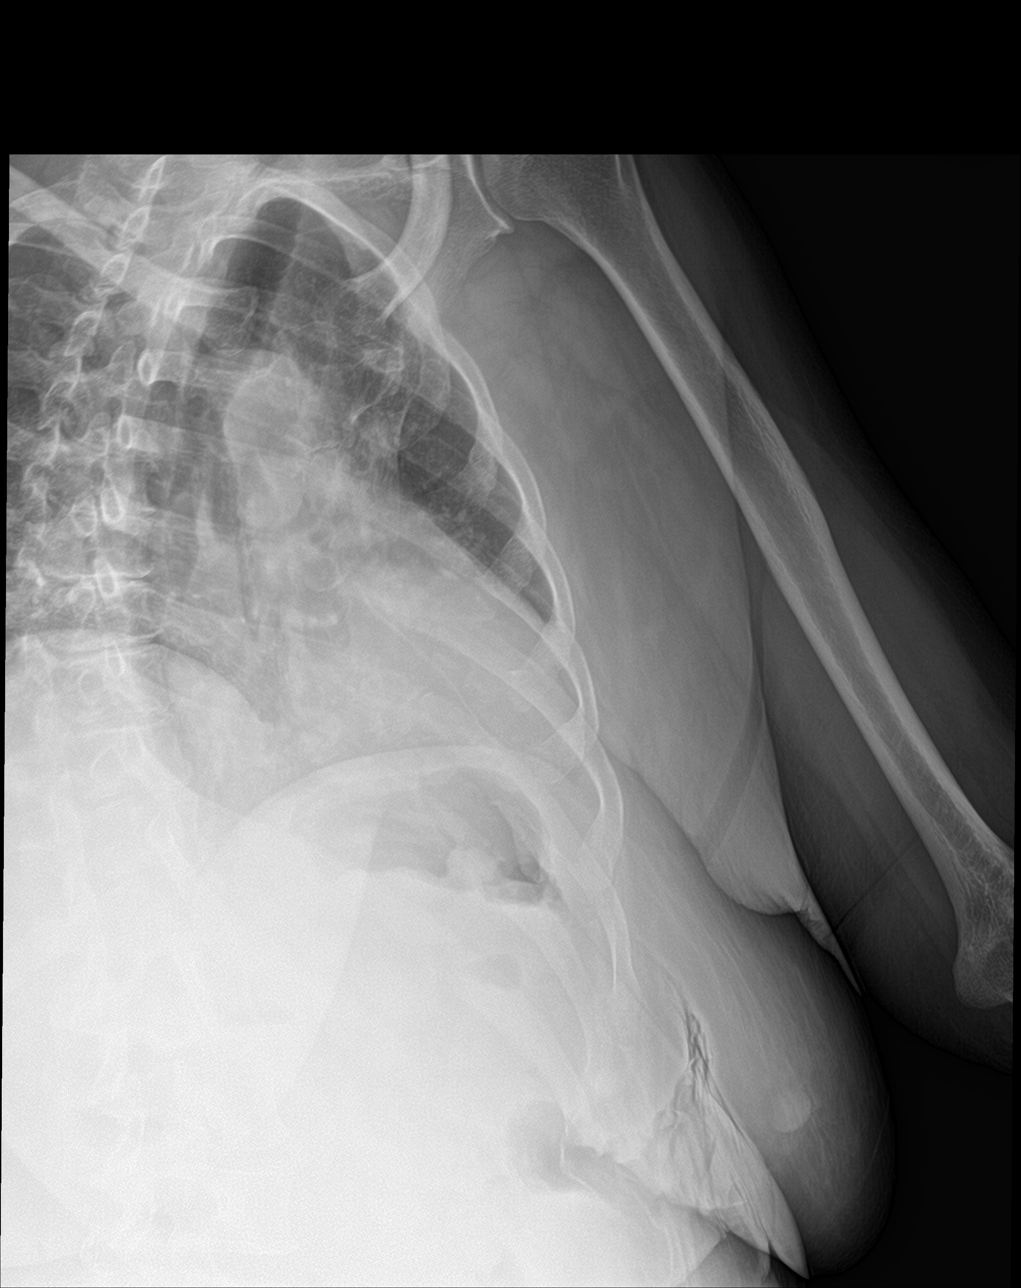

[rib pa]
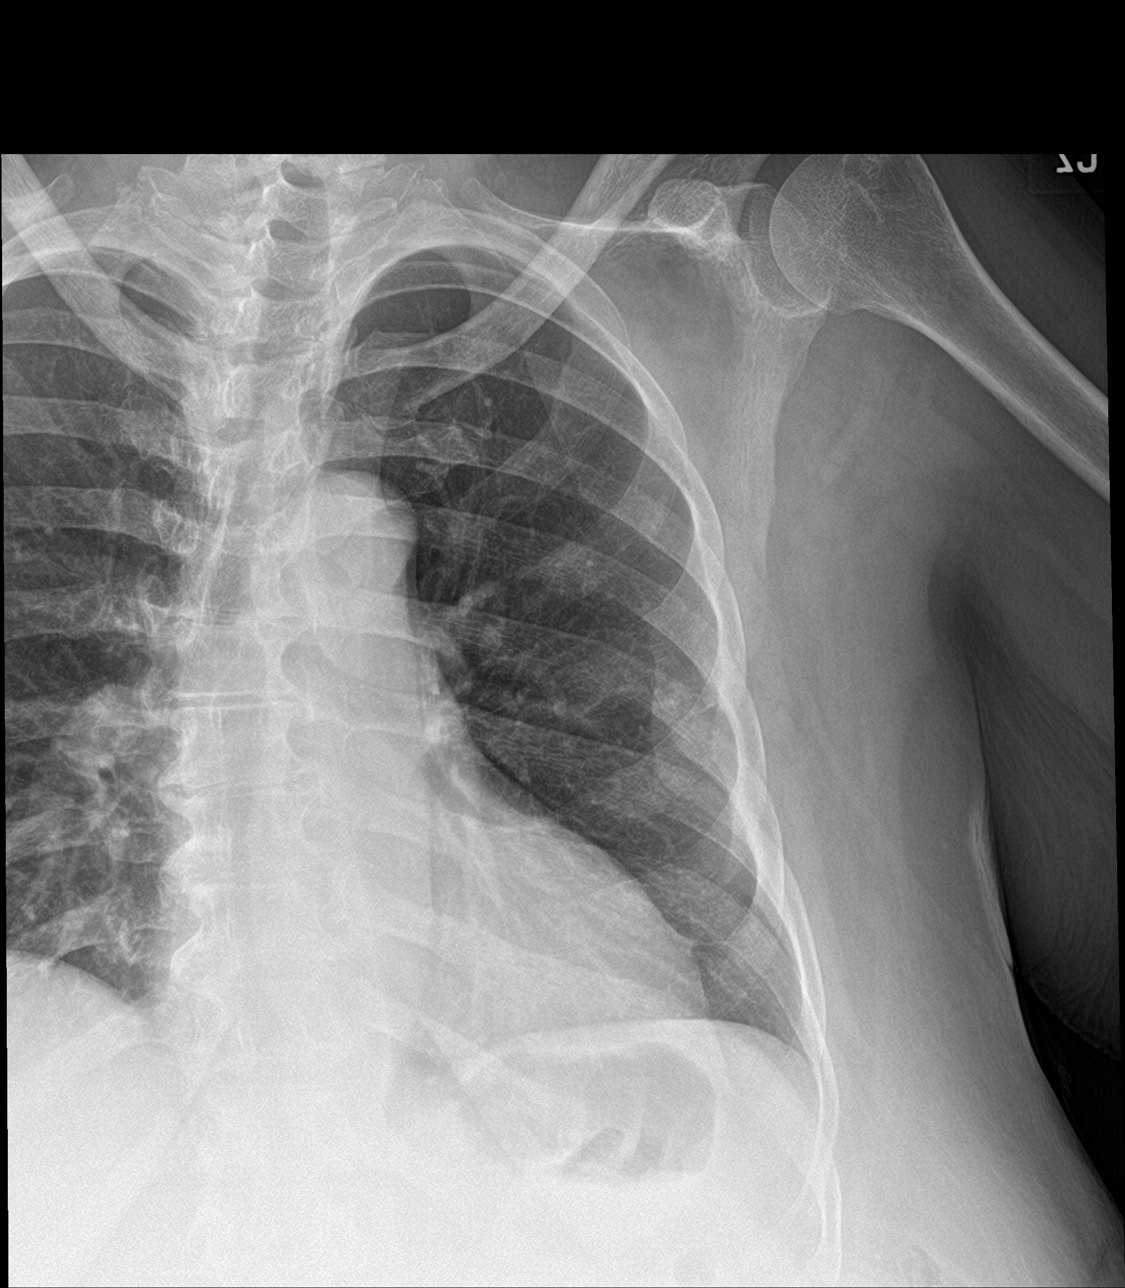

[3 of 3 positions shown; findings below may reference images not displayed]

FINDINGS: Single-view chest demonstrates no acute consolidation or effusion.
Normal heart size. No pneumothorax.

Left rib series demonstrates multiple chronic left rib fractures.
Acute to subacute left seventh rib fracture.
IMPRESSION: 1. Negative for pneumothorax or pleural effusion
2. Multiple old left rib fractures. Acute to subacute left seventh
rib fracture.

## 2021-01-03 NOTE — Telephone Encounter (Signed)
Pt calling to follow up on request to change her Home health service to [facility]. Pt states it has been 2 weeks since she had an aid and that is long enough.  She has things around her house that need doing. Please advise.

## 2021-01-03 NOTE — Telephone Encounter (Signed)
Attempted to contact patient, but no option to leave a message, the recording stated to enter remote access code.  Need to inform her that she has to contact [employer] to change [facility] agencies # [phone] or [phone].  The referral is still pending for home PT, I have not been abel to find an agency that will accept the referral.  Regarding Pain Management referral, that has been sent to [facility] # [phone]

## 2021-01-03 NOTE — Telephone Encounter (Signed)
I scheduled pt's appt with [facility] for tomorrow 01/04/21 at 3:00pm. Pt will see a counselor first for an evaluation and then they will schedule her with a psychiatric NP. Pt was made aware of this. Looks like pt still has concerns with wheelchair.

## 2021-01-03 NOTE — Telephone Encounter (Signed)
I spoke with Ms Chauncy Lean at [phone] from [place] who inquiring about whether pt was provided with information on psychiatry. I informed her that I provided pt with information to schedule an appt with [facility] and that pt attempted to call previously but was told that they do not accept her insurance which was false as pt has medicare and they do accept this. I informed her that I would try to call again today to schedule an appt for pt as I previously attempted to call but their system was down.

## 2021-01-03 NOTE — Telephone Encounter (Signed)
Pt was called and set an virtual appointment to discuss need for wheelchair.

## 2021-01-03 NOTE — Telephone Encounter (Signed)
Call placed to [facility] # [phone] regarding wheelchair fitting. She explained that she just needs the order for the PT eval signed and returned prior to the patient's appointment at the wheelchair seating clinic on 01/05/2021 2 1330.  Informed her that the patient has an appointment with PCP on 01/05/2021 @ 0850 and the order will be faxed to her right after that appointment.  The wheelchair appointment will not have to be cancelled if the order is received before 1130.

## 2021-01-03 NOTE — Telephone Encounter (Signed)
Copied from [place] [phone]. Topic: General - Other >> Jan 03, 2021  9:20 AM Celene Kras wrote: Reason for CRM: Pt calling stating that she is needing to have a referral for PT filled out for [employer]. She states that she spoke with Raquel Sarna at the office and they sent this info to [name] on Thursday 12/30/20. She states that she has an appt on 01/05/21 and is needing to have it done today in order to keep this appt. . Please advise.

## 2021-01-04 NOTE — Telephone Encounter (Signed)
Call placed to patient and informed her that she has to contact [employer] to change PCS agencies.  She said she wanted to make sure that a new referral is not needed.  She has the phone number for [facility] and said she will call tomorrow.    Explained to her that the referral is still pending for home PT, I have not been able to find an agency that will accept the referral.   Regarding Pain Management, she said she went to [facility] yesterday.  She understands she has an appointment with Dr Margarita Rana tomorrow morning and then an appointment at [facility] for her wheelchair sitting.

## 2021-01-05 ENCOUNTER — Telehealth (HOSPITAL_BASED_OUTPATIENT_CLINIC_OR_DEPARTMENT_OTHER): Payer: 59 | Admitting: Family Medicine

## 2021-01-05 ENCOUNTER — Other Ambulatory Visit: Payer: Self-pay

## 2021-01-05 ENCOUNTER — Encounter (HOSPITAL_COMMUNITY): Payer: Self-pay | Admitting: Emergency Medicine

## 2021-01-05 ENCOUNTER — Telehealth: Payer: Self-pay

## 2021-01-05 ENCOUNTER — Encounter: Payer: Self-pay | Admitting: Family Medicine

## 2021-01-05 ENCOUNTER — Emergency Department (HOSPITAL_COMMUNITY)
Admission: EM | Admit: 2021-01-05 | Discharge: 2021-01-06 | Disposition: A | Discharge status: Home / Self Care | Payer: 59 | Attending: Emergency Medicine | Admitting: Emergency Medicine

## 2021-01-05 DIAGNOSIS — M79605 Pain in left leg: Secondary | ICD-10-CM | POA: Diagnosis not present

## 2021-01-05 DIAGNOSIS — Z20822 Contact with and (suspected) exposure to covid-19: Secondary | ICD-10-CM | POA: Insufficient documentation

## 2021-01-05 DIAGNOSIS — I5032 Chronic diastolic (congestive) heart failure: Secondary | ICD-10-CM | POA: Diagnosis not present

## 2021-01-05 DIAGNOSIS — E1122 Type 2 diabetes mellitus with diabetic chronic kidney disease: Secondary | ICD-10-CM | POA: Insufficient documentation

## 2021-01-05 DIAGNOSIS — Y900 Blood alcohol level of less than 20 mg/100 ml: Secondary | ICD-10-CM | POA: Diagnosis not present

## 2021-01-05 DIAGNOSIS — M170 Bilateral primary osteoarthritis of knee: Secondary | ICD-10-CM

## 2021-01-05 DIAGNOSIS — F259 Schizoaffective disorder, unspecified: Secondary | ICD-10-CM | POA: Diagnosis not present

## 2021-01-05 DIAGNOSIS — I130 Hypertensive heart and chronic kidney disease with heart failure and stage 1 through stage 4 chronic kidney disease, or unspecified chronic kidney disease: Secondary | ICD-10-CM | POA: Insufficient documentation

## 2021-01-05 DIAGNOSIS — F250 Schizoaffective disorder, bipolar type: Secondary | ICD-10-CM | POA: Diagnosis not present

## 2021-01-05 DIAGNOSIS — R45851 Suicidal ideations: Secondary | ICD-10-CM | POA: Insufficient documentation

## 2021-01-05 DIAGNOSIS — R600 Localized edema: Secondary | ICD-10-CM | POA: Diagnosis not present

## 2021-01-05 DIAGNOSIS — Z79899 Other long term (current) drug therapy: Secondary | ICD-10-CM | POA: Diagnosis not present

## 2021-01-05 DIAGNOSIS — M79604 Pain in right leg: Secondary | ICD-10-CM

## 2021-01-05 DIAGNOSIS — N183 Chronic kidney disease, stage 3 unspecified: Secondary | ICD-10-CM | POA: Diagnosis not present

## 2021-01-05 DIAGNOSIS — E119 Type 2 diabetes mellitus without complications: Secondary | ICD-10-CM | POA: Diagnosis not present

## 2021-01-05 DIAGNOSIS — Z7951 Long term (current) use of inhaled steroids: Secondary | ICD-10-CM | POA: Insufficient documentation

## 2021-01-05 DIAGNOSIS — Z87891 Personal history of nicotine dependence: Secondary | ICD-10-CM | POA: Insufficient documentation

## 2021-01-05 DIAGNOSIS — J45909 Unspecified asthma, uncomplicated: Secondary | ICD-10-CM | POA: Insufficient documentation

## 2021-01-05 LAB — RESP PANEL BY RT-PCR (FLU A&B, COVID) ARPGX2
Influenza A by PCR: NEGATIVE
Influenza B by PCR: NEGATIVE
SARS Coronavirus 2 by RT PCR: NEGATIVE

## 2021-01-05 LAB — COMPREHENSIVE METABOLIC PANEL
ALT: 26 U/L (ref 0–44)
AST: 30 U/L (ref 15–41)
Albumin: 4 g/dL (ref 3.5–5.0)
Alkaline Phosphatase: 135 U/L — ABNORMAL HIGH (ref 38–126)
Anion gap: 3 — ABNORMAL LOW (ref 5–15)
BUN: 16 mg/dL (ref 8–23)
CO2: 23 mmol/L (ref 22–32)
Calcium: 8.7 mg/dL — ABNORMAL LOW (ref 8.9–10.3)
Chloride: 109 mmol/L (ref 98–111)
Creatinine, Ser: 1.67 mg/dL — ABNORMAL HIGH (ref 0.44–1.00)
GFR, Estimated: 35 mL/min — ABNORMAL LOW (ref >60)
Glucose, Bld: 98 mg/dL (ref 70–99)
Potassium: 3.6 mmol/L (ref 3.5–5.1)
Sodium: 135 mmol/L (ref 135–145)
Total Bilirubin: 0.6 mg/dL (ref 0.3–1.2)
Total Protein: 7 g/dL (ref 6.5–8.1)

## 2021-01-05 LAB — CBC WITH DIFFERENTIAL/PLATELET
Abs Immature Granulocytes: 0.02 10*3/uL (ref 0.00–0.07)
Basophils Absolute: 0.1 10*3/uL (ref 0.0–0.1)
Basophils Relative: 1 %
Eosinophils Absolute: 0.1 10*3/uL (ref 0.0–0.5)
Eosinophils Relative: 2 %
HCT: 32.5 % — ABNORMAL LOW (ref 36.0–46.0)
Hemoglobin: 10.3 g/dL — ABNORMAL LOW (ref 12.0–15.0)
Immature Granulocytes: 0 %
Lymphocytes Relative: 28 %
Lymphs Abs: 1.9 10*3/uL (ref 0.7–4.0)
MCH: 28.9 pg (ref 26.0–34.0)
MCHC: 31.7 g/dL (ref 30.0–36.0)
MCV: 91.3 fL (ref 80.0–100.0)
Monocytes Absolute: 0.5 10*3/uL (ref 0.1–1.0)
Monocytes Relative: 8 %
Neutro Abs: 4.2 10*3/uL (ref 1.7–7.7)
Neutrophils Relative %: 61 %
Platelets: 329 10*3/uL (ref 150–400)
RBC: 3.56 MIL/uL — ABNORMAL LOW (ref 3.87–5.11)
RDW: 15.7 % — ABNORMAL HIGH (ref 11.5–15.5)
WBC: 6.8 10*3/uL (ref 4.0–10.5)
nRBC: 0 % (ref 0.0–0.2)

## 2021-01-05 LAB — RAPID URINE DRUG SCREEN, HOSP PERFORMED
Amphetamines: NOT DETECTED
Barbiturates: NOT DETECTED
Benzodiazepines: POSITIVE — AB
Cocaine: NOT DETECTED
Opiates: NOT DETECTED
Tetrahydrocannabinol: NOT DETECTED

## 2021-01-05 LAB — ETHANOL: Alcohol, Ethyl (B): <10 mg/dL (ref <10)

## 2021-01-05 LAB — SALICYLATE LEVEL: Salicylate Lvl: <7 mg/dL — ABNORMAL LOW (ref 7.0–30.0)

## 2021-01-05 LAB — ACETAMINOPHEN LEVEL: Acetaminophen (Tylenol), Serum: <10 ug/mL — ABNORMAL LOW (ref 10–30)

## 2021-01-05 NOTE — Progress Notes (Signed)
Virtual Visit via Telephone Note  I connected with Tiffany [name], on 01/05/2021 at 8:59 AM by telephone due to the COVID-19 pandemic and verified that I am speaking with the correct person using two identifiers.   Consent: I discussed the limitations, risks, security and privacy concerns of performing an evaluation and management service by telephone and the availability of in person appointments. I also discussed with the patient that there may be a patient responsible charge related to this service. The patient expressed understanding and agreed to proceed.   Location of Patient: Home  Location of Provider: Clinic   Persons participating in Telemedicine visit: [name] Dr. Margarita Rana     History of Present Illness: Tiffany [name] is a 60 y.o. year old female with a history of hypertension, COPD, schizoaffective disorder, dyslipidemia, stage III chronic kidney disease seen for evaluation for power wheelchair.    She needs a power wheelchair due to chronic leg pains (rated as 10/10), low back pain, intermittent Gout flares, neuropathic pain in her legs. She currently uses a rolling walker which does not suffice and she has been falling a lot; a cane, manual wheelchair will also not suffice. She states her legs give out and she falls and she has been on the floor for 3 hours in the past. She will need a power wheelchair to assist with her ADLs and she is willing and has mental capacity to operate a power wheelchair. She states she has had ambulation difficulties since her stroke Past Medical History:  Diagnosis Date   Agitation 11/22/2017   Anoxic brain injury ([place]) 09/08/2016   C. Arrest due to respiratory failure and COPD exacerbation   Anxiety    Arthritis    "all over" (04/10/2016)   Asthma 10/18/2010   Binge eating disorder    Cardiac arrest ([place]) 09/08/2016   PEA   Carotid artery stenosis    1-39% bilateral by dopplers 11/2016   Chronic diastolic (congestive)  heart failure (HCC)    Chronic kidney disease, stage 3 (HCC)    Chronic pain syndrome 06/18/2012   Chronic post-traumatic stress disorder (PTSD) 05/27/2018   Chronic respiratory failure with hypoxia and hypercapnia ([place]) 06/22/2015   TRILOGY Vent >AVAPA-ES., Vt target 200-400, Max P 30 , PS max 20 , PS min 6-10 , E Max 6, E Min 4, Rate Auto AVAPS Rate 2 (titrate for pt comfort) , bleed O2 at 5l/m continuous flow .    Closed displaced fracture of fifth metacarpal bone 03/21/2018   Cocaine use disorder, severe, in sustained remission ([place]) 78/58/8502   Complication of anesthesia    decreased bp, decreased heart rate   COPD (chronic obstructive pulmonary disease) ([place]) 07/08/2014   Depression    Diabetic neuropathy ([place]) 04/24/2011   Difficulty with speech 01/24/2018   Disorder of nervous system    Drug abuse ([place]) 11/21/2017   Dyslipidemia 04/24/2011   Elevated troponin 04/28/2012   Emphysema    Encephalopathy 11/21/2017   Essential hypertension 03/22/2016   Fibula fracture 07/10/2016   Frequent falls 10/11/2017   GERD (gastroesophageal reflux disease)    Gout 04/11/2017   Heart attack ([place]) 1980s   History of blood transfusion 1994   "couldn't stop bleeding from my period"   History of drug abuse in remission ([place]) 11/28/2015   Quit in 2017   Hyperlipidemia LDL goal <70    Incontinence    Manic depression ([place])    Morbid obesity ([place]) 10/18/2010   Obstructive sleep  apnea 10/18/2010   On home oxygen therapy    "6L; 24/7" (04/10/2016)   OSA on CPAP    "wear mask sometimes" (04/10/2016)   Paranoid ([place])    "sometimes; I'm on RX for it" (04/10/2016)   Prolonged Q-T interval on ECG    Rectal bleeding 12/31/2015   Schizoaffective disorder, bipolar type ([place]) 04/05/2018   Seasonal allergies    Seborrheic keratoses 12/31/2013   Seizures ([place])    "don't know what kind; last one was ~ 1 yr ago" (04/10/2016)   Sinus bradycardia    Stroke [facility]) 1980s   denies residual on 04/10/2016    Thrush 09/19/2013   Type 2 diabetes mellitus ([place]) 10/18/2010   Allergies  Allergen Reactions   Hydrocodone Shortness Of Breath   Hydroxyzine Anaphylaxis and Shortness Of Breath   Latuda [Lurasidone Hcl] Anaphylaxis   Magnesium-Containing Compounds Anaphylaxis    Tolerated Ensure   Prednisone Anaphylaxis, Swelling and Other (See Comments)    Tongue swelling, lip swelling, throat swelling, per pt    Tramadol Anaphylaxis and Swelling   Codeine Nausea And Vomiting   Trazodone Other (See Comments)    paranoia   Sulfa Antibiotics Itching   Tape Rash    Current Outpatient Medications on File Prior to Visit  Medication Sig Dispense Refill   albuterol (PROVENTIL) (2.5 MG/3ML) 0.083% nebulizer solution Take 3 mLs (2.5 mg total) by nebulization every 6 (six) hours as needed for wheezing or shortness of breath. 360 mL 5   albuterol (VENTOLIN HFA) 108 (90 Base) MCG/ACT inhaler Inhale 2 puffs into the lungs every 6 (six) hours as needed for wheezing or shortness of breath. 18 g 5   amLODipine (NORVASC) 5 MG tablet Take 1 tablet (5 mg total) by mouth daily. 15 tablet 0   asenapine (SAPHRIS) 5 MG SUBL 24 hr tablet Place 2 tablets (10 mg total) under the tongue 2 (two) times daily. 60 tablet 0   atorvastatin (LIPITOR) 20 MG tablet Take 20 mg by mouth daily.     colchicine 0.6 MG tablet Take 2 tabs (1.2 mg) orally at the onset of gout flare, may repeat 1 tab (0.6 mg) 1 hour if symptoms persist 15 tablet 0   cyclobenzaprine (FLEXERIL) 10 MG tablet Take 1 tablet (10 mg total) by mouth 2 (two) times daily as needed. (Patient taking differently: Take 10 mg by mouth 2 (two) times daily as needed for muscle spasms.) 60 tablet 6   diazepam (VALIUM) 5 MG tablet Take 1 tablet (5 mg total) by mouth every 8 (eight) hours as needed for anxiety. (Patient taking differently: Take by mouth 3 (three) times daily.) 8 tablet 1   diclofenac (VOLTAREN) 75 MG EC tablet TAKE 1 TABLET(75 MG) BY MOUTH TWICE DAILY AS NEEDED  Strength: 75 mg 60 tablet 0   diclofenac Sodium (VOLTAREN) 1 % GEL Apply 4 g topically 4 (four) times daily. 100 g 0   dicyclomine (BENTYL) 10 MG capsule Take 1 capsule (10 mg total) by mouth 3 (three) times daily before meals. 90 capsule 6   escitalopram (LEXAPRO) 10 MG tablet Take 1 tablet (10 mg total) by mouth daily for 15 days. 15 tablet 0   febuxostat (ULORIC) 40 MG tablet Take 1 tablet (40 mg total) by mouth daily. 30 tablet 6   feeding supplement (ENSURE ENLIVE / ENSURE PLUS) LIQD Take 237 mLs by mouth 2 (two) times daily between meals. (Patient not taking: Reported on 12/16/2020)     Fluticasone-Umeclidin-Vilant (TRELEGY ELLIPTA) 100-62.5-25 MCG/ACT  AEPB Inhale 1 puff into the lungs daily. 1 each 6   furosemide (LASIX) 80 MG tablet Take 1 tablet (80 mg total) by mouth daily. 15 tablet 0   gabapentin (NEURONTIN) 600 MG tablet Take 1 tablet (600 mg total) by mouth 2 (two) times daily. 60 tablet 6   Iron, Ferrous Sulfate, 325 (65 Fe) MG TABS Take 325 mg by mouth daily. 60 tablet 2   ketoconazole (NIZORAL) 2 % cream Apply 1 application topically daily. Apply to both feet     lidocaine (LIDODERM) 5 % Place 1 patch onto the skin daily. Remove & Discard patch within 12 hours or as directed by MD 30 patch 1   lisdexamfetamine (VYVANSE) 20 MG capsule Take 20 mg by mouth daily.     montelukast (SINGULAIR) 10 MG tablet Take 1 tablet (10 mg total) by mouth at bedtime. 30 tablet 6   Multiple Vitamin (MULTIVITAMIN WITH MINERALS) TABS tablet Take 1 tablet by mouth daily.     omeprazole (PRILOSEC) 40 MG capsule TAKE 1 CAPSULE(40 MG) BY MOUTH DAILY (Patient taking differently: Take 40 mg by mouth daily.) 30 capsule 5   oxybutynin (DITROPAN) 5 MG tablet TAKE 1 TABLET(5 MG) BY MOUTH TWICE DAILY (Patient taking differently: Take 5 mg by mouth 2 (two) times daily.) 60 tablet 5   potassium chloride SA (KLOR-CON M) 20 MEQ tablet Take 2 tablets (40 mEq total) by mouth 2 (two) times daily. 180 tablet 3   prazosin  (MINIPRESS) 2 MG capsule Take 1 capsule (2 mg total) by mouth at bedtime. 30 capsule 0   sodium chloride (OCEAN) 0.65 % SOLN nasal spray Place 2 sprays into both nostrils daily.     terbinafine (LAMISIL AT) 1 % cream Apply 1 application topically 2 (two) times daily. (Patient taking differently: Apply 1 application topically 3 (three) times daily.) 30 g 1   thiamine 100 MG tablet Take 1 tablet (100 mg total) by mouth daily. (Patient not taking: Reported on 12/16/2020)     Current Facility-Administered Medications on File Prior to Visit  Medication Dose Route Frequency Provider Last Rate Last Admin   betamethasone acetate-betamethasone sodium phosphate (CELESTONE) injection 6 mg  6 mg Other Once Magnus Sinning, MD        ROS: See HPI  Observations/Objective: Awake, alert, ranted x3 Not in acute distress Musculoskeletal exam from previous visits revealed bilateral lower extremity edema, tenderness and slight palpation of bilateral lower extremity with restricted range of motion in bilateral lower extremity. No upper extremity weakness  CMP Latest Ref Rng & Units 12/26/2020 12/16/2020 11/16/2020  Glucose 70 - 99 mg/dL 93 92 86  BUN 8 - 23 mg/dL 13 17 10   Creatinine 0.44 - 1.00 mg/dL 1.60(H) 1.76(H) 1.54(H)  Sodium 135 - 145 mmol/L 134(L) 141 140  Potassium 3.5 - 5.1 mmol/L 3.2(L) 3.1(L) 3.8  Chloride 98 - 111 mmol/L 96(L) 101 109  CO2 22 - 32 mmol/L 27 28 23   Calcium 8.9 - 10.3 mg/dL 9.0 9.4 9.0  Total Protein 6.5 - 8.1 g/dL 6.7 6.5 5.7(L)  Total Bilirubin 0.3 - 1.2 mg/dL 0.6 0.5 0.3  Alkaline Phos 38 - 126 U/L 144(H) 151(H) 119  AST 15 - 41 U/L 32 34 46(H)  ALT 0 - 44 U/L 31 39 36    Lipid Panel     Component Value Date/Time   CHOL 127 08/05/2018 1748   TRIG 53 08/05/2018 1748   HDL 62 08/05/2018 1748   CHOLHDL 2.0 08/05/2018 1748  CHOLHDL 3.7 01/07/2017 0005   VLDL 38 01/07/2017 0005   LDLCALC 54 08/05/2018 1748   LDLDIRECT 52 04/05/2012 1210   LABVLDL 11 08/05/2018 1748     Lab Results  Component Value Date   HGBA1C 5.3 07/23/2020    Assessment and Plan: 1. Pain in both lower extremities This affects her ability to ambulate with resulting increased falls She will benefit from a power mobility device I have completed form and will fax over  2. Edema of both legs Dependent edema with underlying pain lower extremities Continue to elevate feet and use diuretics Encouraged to comply with a low-sodium diet, elevate feet, use compression stockings  3 Primary osteoarthritis of both knees Uncontrolled with ongoing pain Currently followed by orthopedic  Follow Up Instructions: Keep previously scheduled appointment   I discussed the assessment and treatment plan with the patient. The patient was provided an opportunity to ask questions and all were answered. The patient agreed with the plan and demonstrated an understanding of the instructions.   The patient was advised to call back or seek an in-person evaluation if the symptoms worsen or if the condition fails to improve as anticipated.     I provided 9 minutes total of non-face-to-face time during this encounter.   [name], MD, FAAFP. [facility] [place], [place]   01/05/2021, 8:59 AM

## 2021-01-05 NOTE — ED Triage Notes (Signed)
Patient presents with HI/ SI and hopelessness. She has felt this way for a few months due to being off her medication. She sites her family as being a stressor.
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
## 2021-01-05 NOTE — Telephone Encounter (Signed)
Order for  PT power wheelchair evaluation faxed to [employer].  This CM called and spoke to [facility] / [employer] and she confirmed receipt of the fax. Patient is scheduled to be seen for the evaluation later this morning.

## 2021-01-05 NOTE — ED Notes (Signed)
2 bags of patient belongings were place in locker 31 in [place]. Gilford Rile was placed behind the [employer] nurses station.

## 2021-01-06 ENCOUNTER — Ambulatory Visit (HOSPITAL_BASED_OUTPATIENT_CLINIC_OR_DEPARTMENT_OTHER): Payer: 59 | Admitting: Family

## 2021-01-06 DIAGNOSIS — R45851 Suicidal ideations: Secondary | ICD-10-CM

## 2021-01-06 DIAGNOSIS — F259 Schizoaffective disorder, unspecified: Secondary | ICD-10-CM

## 2021-01-06 MED ORDER — FLUTICASONE-UMECLIDIN-VILANT 100-62.5-25 MCG/ACT IN AEPB: 1.0000 | INHALATION_SPRAY | Freq: Every day | RESPIRATORY_TRACT | Status: DC

## 2021-01-06 MED ORDER — ASENAPINE MALEATE 5 MG SL SUBL
10.0000 mg | SUBLINGUAL_TABLET | Freq: Two times a day (BID) | SUBLINGUAL | Status: DC
  Administered 2021-01-06: 12:00:00 10 mg via SUBLINGUAL
  Filled 2021-01-06: qty 2

## 2021-01-06 MED ORDER — ESCITALOPRAM OXALATE 10 MG PO TABS
10.0000 mg | ORAL_TABLET | Freq: Every day | ORAL | Status: DC
  Administered 2021-01-06: 12:00:00 10 mg via ORAL
  Filled 2021-01-06: qty 1

## 2021-01-06 MED ORDER — MONTELUKAST SODIUM 10 MG PO TABS: 10.0000 mg | ORAL_TABLET | Freq: Every day | ORAL | Status: DC

## 2021-01-06 MED ORDER — CYCLOBENZAPRINE HCL 10 MG PO TABS
10.0000 mg | ORAL_TABLET | Freq: Two times a day (BID) | ORAL | Status: DC | PRN
  Administered 2021-01-06: 12:00:00 10 mg via ORAL
  Filled 2021-01-06: qty 1

## 2021-01-06 MED ORDER — AMLODIPINE BESYLATE 5 MG PO TABS
5.0000 mg | ORAL_TABLET | Freq: Every day | ORAL | Status: DC
  Administered 2021-01-06: 12:00:00 5 mg via ORAL
  Filled 2021-01-06: qty 1

## 2021-01-06 MED ORDER — FEBUXOSTAT 40 MG PO TABS
40.0000 mg | ORAL_TABLET | Freq: Every day | ORAL | Status: DC
Start: 2021-01-06 — End: 2021-01-06

## 2021-01-06 MED ORDER — PRAZOSIN HCL 1 MG PO CAPS: 2.0000 mg | ORAL_CAPSULE | Freq: Every day | ORAL | Status: DC

## 2021-01-06 MED ORDER — DICLOFENAC SODIUM 75 MG PO TBEC
75.0000 mg | DELAYED_RELEASE_TABLET | Freq: Two times a day (BID) | ORAL | Status: DC | PRN
  Filled 2021-01-06: qty 1

## 2021-01-06 MED ORDER — DICYCLOMINE HCL 10 MG PO CAPS
10.0000 mg | ORAL_CAPSULE | Freq: Three times a day (TID) | ORAL | Status: DC
  Administered 2021-01-06: 12:00:00 10 mg via ORAL
  Filled 2021-01-06 (×2): qty 1

## 2021-01-06 MED ORDER — ATORVASTATIN CALCIUM 10 MG PO TABS
20.0000 mg | ORAL_TABLET | Freq: Every day | ORAL | Status: DC
  Administered 2021-01-06: 12:00:00 20 mg via ORAL
  Filled 2021-01-06: qty 2

## 2021-01-06 NOTE — BH Assessment (Signed)
Comprehensive Clinical Assessment (CCA) Note  01/06/2021 FREIDA NEBEL [id number]   Disposition:    Lindon Romp, NP recommends overnight observation. Pt to be reevaluated tomorrow morning.  The patient demonstrates the following risk factors for suicide: Chronic risk factors for suicide include: psychiatric disorder of majorp depression . Acute risk factors for suicide include: family or marital conflict. Protective factors for this patient include:  n/a . Considering these factors, the overall suicide risk at this point appears to be moderate. Patient is not appropriate for outpatient follow up.    [place] ED from 01/05/2021 in [place] DEPT ED from 12/26/2020 in [place] ED from 12/16/2020 in [place] Error: Q7 should not be populated when Q6 is No No Risk No Risk         Pt is a 60 year old female reporting to the ED due to SI, no plan/intent and HI, no plan/intent. Pt reports her stressors are her family due them coming in and out of her house. Pt reports not being able to contract for safety and reports last SI is now in the ED. Pt reports a hx of Major Depression and OCD. Pt reports seeing a therapist at [facility] and denies seeing a psychiatrist. Pt denies legal issues. Pt denies having access to weapons. Pt denies self harm and AVH. Pt denies substance use.  Dispositioned with Lindon Romp, NP recommends overnight observation. Pt to be reevaluated tomorrow morning.     Chief Complaint:  Chief Complaint  Patient presents with   Suicidal   Homicidal   Visit Diagnosis: Major Depressive Disorder    CCA Screening, Triage and Referral (STR)  Patient Reported Information How did you hear about [name]? Self  What Is the Reason for Your Visit/Call Today? Pt reports to the ED due to suicidal thoughts no plan, and homicidal  thoughts no plan.  How Long Has This Been Causing You Problems? <Week  What Do You Feel Would Help You the Most Today? Treatment for Depression or other mood problem   Have You Recently Had Any Thoughts About Hurting Yourself? Yes (pt reports no plan)  Are You Planning to Commit Suicide/Harm Yourself At This time? No   Have you Recently Had Thoughts About [place]? Yes (pt reports HI no plan or intent)  Are You Planning to Harm Someone at This Time? No  Explanation: pt denied   Have You Used Any Alcohol or Drugs in the Past 24 Hours? No  How Long Ago Did You Use Drugs or Alcohol? No data recorded What Did You Use and How Much? No data recorded  Do You Currently Have a Therapist/Psychiatrist? Yes  Name of Therapist/Psychiatrist: Pt reports seeing a therapist at [facility] Recently Discharged From Any Office Practice or Programs? No  Explanation of Discharge From Practice/Program: No data recorded    CCA Screening Triage Referral Assessment Type of Contact: Tele-Assessment  Telemedicine Service Delivery:   Is this Initial or Reassessment? Initial Assessment  Date Telepsych consult ordered in [facility]:  01/05/21  Time Telepsych consult ordered in [facility]:  2232  Location of Assessment: [facility]  Provider Location: [facility] Assessment Services   Collateral Involvement: none   Does Patient Have a [place]? No data recorded Name and Contact of Legal Guardian: No data recorded If Minor and Not Living with Parent(s), Who has Custody? n/a  Is CPS involved or ever been involved? Never  Is APS involved or ever been involved? Never   Patient Determined To Be At Risk for Harm To Self or Others Based on Review of Patient Reported Information or Presenting Complaint? Yes, for Self-Harm  Method: No data recorded Availability of Means: No data recorded Intent: No data recorded Notification Required: No data  recorded Additional Information for Danger to Others Potential: No data recorded Additional Comments for Danger to Others Potential: No data recorded Are There Guns or Other Weapons in Your Home? No data recorded Types of Guns/Weapons: No data recorded Are These Weapons Safely Secured?                            No data recorded Who Could Verify You Are Able To Have These Secured: No data recorded Do You Have any Outstanding Charges, Pending Court Dates, Parole/Probation? No data recorded Contacted To Inform of Risk of Harm To Self or Others: -- (n/a)    Does Patient Present under Involuntary Commitment? No  IVC Papers Initial File Date: No data recorded  [place] of Residence: [place]   Patient Currently Receiving the Following Services: Individual Therapy   Determination of Need: Urgent (48 hours)   Options For Referral: Medication Management     CCA Biopsychosocial Patient Reported Schizophrenia/Schizoaffective Diagnosis in Past: No   Strengths: Pt denied   Mental Health Symptoms Depression:   Hopelessness   Duration of Depressive symptoms:  Duration of Depressive Symptoms: Less than two weeks   Mania:   None   Anxiety:    Restlessness   Psychosis:   None   Duration of Psychotic symptoms:  Duration of Psychotic Symptoms: N/A   Trauma:   None   Obsessions:   None   Compulsions:   None   Inattention:   None   Hyperactivity/Impulsivity:   None   Oppositional/Defiant Behaviors:   None   Emotional Irregularity:   None   Other Mood/Personality Symptoms:   Pt presented with a flat affect.    Mental Status Exam Appearance and self-care  Stature:   Average   Weight:   Thin   Clothing:   -- (pt was in hospital scrubs)   Grooming:   Normal   Cosmetic use:   None   Posture/gait:   Normal   Motor activity:   Slowed   Sensorium  Attention:   Normal   Concentration:   Normal   Orientation:   X5   Recall/memory:    Defective in Immediate   Affect and Mood  Affect:   Flat   Mood:   Depressed   Relating  Eye contact:   Fleeting   Facial expression:   Depressed   Attitude toward examiner:   Irritable   Thought and Language  Speech flow:  Soft   Thought content:   Appropriate to Mood and Circumstances   Preoccupation:   None   Hallucinations:   None   Organization:  No data recorded  [employer] of Knowledge:   Poor   Intelligence:   Average   Abstraction:   Normal   Judgement:   Normal   Reality Testing:   Distorted   Insight:   Fair   Decision Making:   Normal   Social Functioning  Social Maturity:   -- (unable to assess)   Social Judgement:   Normal   Stress  Stressors:   Family conflict  Coping Ability:   Overwhelmed   Skill Deficits:   Self-care   Supports:   Other (Comment) (Pt denied)     Religion: Religion/Spirituality Are You A Religious Person?: Yes What is Your Religious Affiliation?: Catholic How Might This Affect Treatment?: pt denied  Leisure/Recreation: Leisure / Recreation Do You Have Hobbies?: No  Exercise/Diet: Exercise/Diet Do You Exercise?: No Have You Gained or Lost A Significant Amount of Weight in the Past Six Months?: No Do You Follow a Special Diet?: No Do You Have Any Trouble Sleeping?: No   CCA Employment/Education Employment/Work Situation: Employment / Work Technical sales engineer: On disability Why is Patient on Disability: pt refused How Long has Patient Been on Disability: unknown Patient's Job has Been Impacted by Current Illness: No Describe how Patient's Job has Been Impacted: n/a Has Patient ever Been in the [employer]?: No  Education: Education Is Patient Currently Attending School?: No Did You Nutritional therapist?: No Did You Have An Individualized Education Program (IIEP): No Did You Have Any Difficulty At School?: No Patient's Education Has Been Impacted by Current  Illness: No   CCA Family/Childhood History Family and Relationship History: Family history Marital status: Widowed Widowed, when?: unknown Does patient have children?: No How many children?: 4 How is patient's relationship with their children?: unknown  Childhood History:  Childhood History By whom was/is the patient raised?:  (unknown) Did patient suffer any verbal/emotional/physical/sexual abuse as a child?: No Did patient suffer from severe childhood neglect?: No Has patient ever been sexually abused/assaulted/raped as an adolescent or adult?: No Was the patient ever a victim of a crime or a disaster?: No Witnessed domestic violence?: No Has patient been affected by domestic violence as an adult?: No  Child/Adolescent Assessment:     CCA Substance Use Alcohol/Drug Use: Alcohol / Drug Use Pain Medications: unknown Prescriptions: Vyvanse, Valium, Lexapro Over the Counter: n/a History of alcohol / drug use?: No history of alcohol / drug abuse Longest period of sobriety (when/how long): n/a Negative Consequences of Use:  (n/a) Withdrawal Symptoms:  (n/a)                         ASAM's:  Six Dimensions of Multidimensional Assessment  Dimension 1:  Acute Intoxication and/or Withdrawal Potential:      Dimension 2:  Biomedical Conditions and Complications:      Dimension 3:  Emotional, Behavioral, or Cognitive Conditions and Complications:     Dimension 4:  Readiness to Change:     Dimension 5:  Relapse, Continued use, or Continued Problem Potential:     Dimension 6:  Recovery/Living Environment:     ASAM Severity Score:    ASAM Recommended Level of Treatment:     Substance use Disorder (SUD)    Recommendations for Services/Supports/Treatments:    Discharge Disposition: Discharge Disposition Medical Exam completed: Yes Disposition of Patient:  (per Lindon Romp NP, recommened overnight observation) Mode of transportation if patient is  discharged/movement?: N/A  DSM5 Diagnoses: Patient Active Problem List   Diagnosis Date Noted   Neuropathy 11/18/2020   Pain in both feet 11/18/2020   Localized edema 11/18/2020   Encounter for medication management 11/17/2020   Protein-calorie malnutrition, severe 05/15/2020   Malnutrition of moderate degree 17/00/1749   Periumbilical abdominal pain    Dysphagia    Nausea vomiting and diarrhea    Generalized abdominal pain    E. coli UTI 44/96/7591   Acute metabolic encephalopathy 63/84/6659   Psychosis ([place]) 02/20/2020  Schizophrenia, schizo-affective ([place]) 78/58/8502   Metabolic acidosis, normal anion gap (NAG) 02/20/2020   Altered mental status    Hypocalcemia    AKI (acute kidney injury) ([place]) 02/18/2020   Pelvic mass in female 01/02/2020   Genital herpes 11/25/2019   Stage 2 skin ulcer of sacral region ([place]) 10/28/2019   ARF (acute renal failure) ([place]) 10/27/2019   Family discord 02/04/2019   Aggressive behavior    PTSD (post-traumatic stress disorder) 05/27/2018   Schizoaffective disorder, bipolar type ([place]) 04/05/2018   Closed displaced fracture of fifth metacarpal bone 03/21/2018   Difficulty with speech 01/24/2018   Encephalopathy 11/21/2017   Drug abuse ([place]) 11/21/2017   Frequent falls 10/11/2017   Binge eating disorder    Dependence on continuous supplemental oxygen 05/14/2017   Gout 04/11/2017   Hypomagnesemia    CKD (chronic kidney disease) stage 3, GFR 30-59 ml/min (HCC) 12/15/2016   Carotid artery stenosis    Hypokalemia    Osteoarthritis 10/26/2016   Anoxic brain injury ([place]) 09/08/2016   Overactive bladder 06/07/2016   QT prolongation    OSA and COPD overlap syndrome ([place])    Arthritis    Essential hypertension 03/22/2016   Cocaine use disorder, severe, in sustained remission ([place]) 12/17/2015   History of drug abuse in remission ([place]) 11/28/2015   Hyponatremia 11/25/2015   Chronic diastolic congestive heart failure (HCC)    Chronic respiratory  failure with hypoxia ([place]) 06/22/2015   Tobacco use disorder 07/22/2014   COPD (chronic obstructive pulmonary disease) ([place]) 07/08/2014   Seizure ([place]) 01/04/2013   Chronic pain syndrome 06/18/2012   Dyslipidemia 04/24/2011   Anemia 04/24/2011   Diabetic neuropathy ([place]) 04/24/2011   Morbid obesity ([place]) 10/18/2010   Type 2 diabetes mellitus ([place]) 10/18/2010     Referrals to Alternative Service(s): Referred to Alternative Service(s):   Place:   Date:   Time:    Referred to Alternative Service(s):   Place:   Date:   Time:    Referred to Alternative Service(s):   Place:   Date:   Time:    Referred to Alternative Service(s):   Place:   Date:   Time:     Tobie Perdue M. Devann Cribb, Deon Pilling

## 2021-01-06 NOTE — ED Notes (Signed)
Patient up to bathroom with walker.  Minimal assistance provided by Probation officer.

## 2021-01-06 NOTE — ED Notes (Signed)
Let pt know she has been seen by two providers and is discharged. Pt said she has not been seen by two doctors and we are lying. Pt said, "Call transportation and you will see what I do." [employer] attempted to deescalate pt.

## 2021-01-06 NOTE — Telephone Encounter (Signed)
Call placed to [facility], spoke to Shelley who requested referral be faxed for review.   Referral then faxed to # [phone].

## 2021-01-06 NOTE — BH Assessment (Signed)
[facility] [facility] Assessment Progress Note   Per Sheran Fava, NP, this voluntary pt does not require psychiatric hospitalization at this time.  Pt is psychiatrically cleared.  Discharge instructions advise pt to continue treatment with [facility].  A TOC consult has been ordered to address pt's psychosocial needs.  EDP Calvert Cantor, NP and pt's nurse, Joellen Jersey, have been notified.  Jalene Mullet, [place] Triage Specialist [phone]
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
## 2021-01-06 NOTE — ED Notes (Signed)
Pt requested assistance getting dressed. Pt calm and cooperative throughout process. Returned pt green pants, black belt, tan socks, black shirt., pair black high top shoes, 1 small black lock, 1 large black lock, 1 small green lock, 1 black watch, 2 key rings with many keys, pulse ox in case, brown jacket, pink book, discharge papers (past and current), 1 pair earrings, 3 rings, chap stick, wallet and all it's contents including $64 cash and $35 [employer] card. Pt mace and tazer in securities office and will be given to pt as she departs the ED. Pt confirmed all belongings are present and signed patient valuables envelope confirming all belongings returned.
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
## 2021-01-06 NOTE — Telephone Encounter (Signed)
Call placed to [facility] again, spoke to [place] who requested referral be faxed for review.  Referral then faxed to # [phone].

## 2021-01-06 NOTE — ED Notes (Signed)
Social work let pt know that she was in the process of being discharged. Pt does not want to be discharged and wants inpatient placement. Pt turned over the bedside table and began cursing.

## 2021-01-06 NOTE — Discharge Instructions (Signed)
For your behavioral health needs you are advised to continue treatment with [facility]:       [facility]      [address]      [place], [place] [address]      [phone]

## 2021-01-06 NOTE — ED Provider Notes (Signed)
Emergency Medicine Observation Re-evaluation Note  Tiffany [name] is a 60 y.o. female, seen on rounds today.  Pt initially presented to the ED for complaints of Suicidal and Homicidal Currently, the patient is resting comfortably.  Physical Exam  BP (!) 153/91 (BP Location: Left Arm)    Pulse 90    Temp 98.2 F (36.8 C) (Oral)    Resp 18    Ht 5\' 2"  (1.575 m)    Wt 52.2 kg    SpO2 95%    BMI 21.03 kg/m  Physical Exam General: No distress Lungs: Resp even and unlabored Psych: Calm and cooperative  ED Course / MDM  EKG:   I have reviewed the labs performed to date as well as medications administered while in observation.  Recent changes in the last 24 hours include none.  Plan  Current plan is for Reassess by Psych today.  Tiffany [name] is not under involuntary commitment.     Truddie Hidden, MD 01/06/21 [phone]

## 2021-01-08 ENCOUNTER — Encounter (HOSPITAL_COMMUNITY): Payer: Self-pay | Admitting: Emergency Medicine

## 2021-01-08 ENCOUNTER — Emergency Department (HOSPITAL_COMMUNITY)
Admission: EM | Admit: 2021-01-08 | Discharge: 2021-01-09 | Discharge status: Against Medical Advice | Payer: 59 | Source: Home / Self Care | Attending: Emergency Medicine | Admitting: Emergency Medicine

## 2021-01-08 ENCOUNTER — Emergency Department (HOSPITAL_COMMUNITY)
Admission: EM | Admit: 2021-01-08 | Discharge: 2021-01-08 | Disposition: A | Discharge status: Home / Self Care | Payer: 59 | Attending: Emergency Medicine | Admitting: Emergency Medicine

## 2021-01-08 ENCOUNTER — Emergency Department (HOSPITAL_COMMUNITY): Payer: 59

## 2021-01-08 ENCOUNTER — Other Ambulatory Visit: Payer: Self-pay

## 2021-01-08 ENCOUNTER — Encounter (HOSPITAL_COMMUNITY): Payer: Self-pay

## 2021-01-08 DIAGNOSIS — R0789 Other chest pain: Secondary | ICD-10-CM

## 2021-01-08 DIAGNOSIS — J45909 Unspecified asthma, uncomplicated: Secondary | ICD-10-CM | POA: Insufficient documentation

## 2021-01-08 DIAGNOSIS — E1122 Type 2 diabetes mellitus with diabetic chronic kidney disease: Secondary | ICD-10-CM | POA: Insufficient documentation

## 2021-01-08 DIAGNOSIS — N183 Chronic kidney disease, stage 3 unspecified: Secondary | ICD-10-CM | POA: Insufficient documentation

## 2021-01-08 DIAGNOSIS — I5032 Chronic diastolic (congestive) heart failure: Secondary | ICD-10-CM | POA: Insufficient documentation

## 2021-01-08 DIAGNOSIS — Z7951 Long term (current) use of inhaled steroids: Secondary | ICD-10-CM | POA: Diagnosis not present

## 2021-01-08 DIAGNOSIS — F250 Schizoaffective disorder, bipolar type: Secondary | ICD-10-CM | POA: Insufficient documentation

## 2021-01-08 DIAGNOSIS — I130 Hypertensive heart and chronic kidney disease with heart failure and stage 1 through stage 4 chronic kidney disease, or unspecified chronic kidney disease: Secondary | ICD-10-CM | POA: Insufficient documentation

## 2021-01-08 DIAGNOSIS — Z955 Presence of coronary angioplasty implant and graft: Secondary | ICD-10-CM | POA: Insufficient documentation

## 2021-01-08 DIAGNOSIS — Z87891 Personal history of nicotine dependence: Secondary | ICD-10-CM | POA: Insufficient documentation

## 2021-01-08 DIAGNOSIS — Z79899 Other long term (current) drug therapy: Secondary | ICD-10-CM | POA: Diagnosis not present

## 2021-01-08 DIAGNOSIS — F22 Delusional disorders: Secondary | ICD-10-CM | POA: Insufficient documentation

## 2021-01-08 DIAGNOSIS — J449 Chronic obstructive pulmonary disease, unspecified: Secondary | ICD-10-CM | POA: Diagnosis not present

## 2021-01-08 LAB — CBC
HCT: 37.4 % (ref 36.0–46.0)
Hemoglobin: 11.6 g/dL — ABNORMAL LOW (ref 12.0–15.0)
MCH: 28.5 pg (ref 26.0–34.0)
MCHC: 31 g/dL (ref 30.0–36.0)
MCV: 91.9 fL (ref 80.0–100.0)
Platelets: 329 10*3/uL (ref 150–400)
RBC: 4.07 MIL/uL (ref 3.87–5.11)
RDW: 15.7 % — ABNORMAL HIGH (ref 11.5–15.5)
WBC: 6.5 10*3/uL (ref 4.0–10.5)
nRBC: 0 % (ref 0.0–0.2)

## 2021-01-08 LAB — BASIC METABOLIC PANEL
Anion gap: 10 (ref 5–15)
BUN: 13 mg/dL (ref 8–23)
CO2: 23 mmol/L (ref 22–32)
Calcium: 9.8 mg/dL (ref 8.9–10.3)
Chloride: 108 mmol/L (ref 98–111)
Creatinine, Ser: 1.45 mg/dL — ABNORMAL HIGH (ref 0.44–1.00)
GFR, Estimated: 41 mL/min — ABNORMAL LOW (ref >60)
Glucose, Bld: 77 mg/dL (ref 70–99)
Potassium: 3.5 mmol/L (ref 3.5–5.1)
Sodium: 141 mmol/L (ref 135–145)

## 2021-01-08 LAB — TROPONIN I (HIGH SENSITIVITY): Troponin I (High Sensitivity): 11 ng/L (ref <18)

## 2021-01-08 MED ORDER — FLUTICASONE-UMECLIDIN-VILANT 100-62.5-25 MCG/ACT IN AEPB: 1.0000 | INHALATION_SPRAY | Freq: Every day | RESPIRATORY_TRACT | Status: DC

## 2021-01-08 MED ORDER — DICYCLOMINE HCL 10 MG PO CAPS: 10.0000 mg | ORAL_CAPSULE | Freq: Three times a day (TID) | ORAL | Status: DC

## 2021-01-08 MED ORDER — DICLOFENAC SODIUM 1 % EX GEL
4.0000 g | Freq: Four times a day (QID) | CUTANEOUS | Status: DC | PRN
  Filled 2021-01-08: qty 100

## 2021-01-08 MED ORDER — PRAZOSIN HCL 2 MG PO CAPS: 2.0000 mg | ORAL_CAPSULE | Freq: Every day | ORAL | Status: DC

## 2021-01-08 MED ORDER — ADULT MULTIVITAMIN W/MINERALS CH: 1.0000 | ORAL_TABLET | Freq: Every day | ORAL | Status: DC

## 2021-01-08 MED ORDER — LISDEXAMFETAMINE DIMESYLATE 20 MG PO CAPS: 20.0000 mg | ORAL_CAPSULE | Freq: Every day | ORAL | Status: DC

## 2021-01-08 MED ORDER — CYCLOBENZAPRINE HCL 10 MG PO TABS: 10.0000 mg | ORAL_TABLET | Freq: Two times a day (BID) | ORAL | Status: DC | PRN

## 2021-01-08 MED ORDER — FUROSEMIDE 20 MG PO TABS: 80.0000 mg | ORAL_TABLET | Freq: Every day | ORAL | Status: DC

## 2021-01-08 MED ORDER — ATORVASTATIN CALCIUM 10 MG PO TABS: 20.0000 mg | ORAL_TABLET | Freq: Every day | ORAL | Status: DC

## 2021-01-08 MED ORDER — DIAZEPAM 5 MG PO TABS: 5.0000 mg | ORAL_TABLET | Freq: Three times a day (TID) | ORAL | Status: DC

## 2021-01-08 MED ORDER — POTASSIUM CHLORIDE CRYS ER 20 MEQ PO TBCR: 40.0000 meq | EXTENDED_RELEASE_TABLET | Freq: Two times a day (BID) | ORAL | Status: DC

## 2021-01-08 MED ORDER — MONTELUKAST SODIUM 10 MG PO TABS
10.0000 mg | ORAL_TABLET | Freq: Every day | ORAL | Status: DC
Start: 2021-01-08 — End: 2021-01-08

## 2021-01-08 MED ORDER — OXYBUTYNIN CHLORIDE 5 MG PO TABS: 5.0000 mg | ORAL_TABLET | Freq: Two times a day (BID) | ORAL | Status: DC

## 2021-01-08 MED ORDER — ESCITALOPRAM OXALATE 10 MG PO TABS: 10.0000 mg | ORAL_TABLET | Freq: Every day | ORAL | Status: DC

## 2021-01-08 MED ORDER — FERROUS SULFATE 325 (65 FE) MG PO TABS: 325.0000 mg | ORAL_TABLET | Freq: Every day | ORAL | Status: DC

## 2021-01-08 MED ORDER — ALPRAZOLAM 0.25 MG PO TABS
0.2500 mg | ORAL_TABLET | Freq: Once | ORAL | Status: AC
  Administered 2021-01-09: 01:00:00 0.25 mg via ORAL
  Filled 2021-01-08: qty 1

## 2021-01-08 MED ORDER — GABAPENTIN 300 MG PO CAPS: 600.0000 mg | ORAL_CAPSULE | Freq: Two times a day (BID) | ORAL | Status: DC

## 2021-01-08 MED ORDER — DICLOFENAC SODIUM 75 MG PO TBEC
75.0000 mg | DELAYED_RELEASE_TABLET | Freq: Two times a day (BID) | ORAL | Status: DC | PRN
  Filled 2021-01-08: qty 1

## 2021-01-08 MED ORDER — AMLODIPINE BESYLATE 5 MG PO TABS: 5.0000 mg | ORAL_TABLET | Freq: Every day | ORAL | Status: DC

## 2021-01-08 MED ORDER — PANTOPRAZOLE SODIUM 40 MG PO TBEC: 40.0000 mg | DELAYED_RELEASE_TABLET | Freq: Every day | ORAL | Status: DC

## 2021-01-08 MED ORDER — ALBUTEROL SULFATE HFA 108 (90 BASE) MCG/ACT IN AERS: 2.0000 | INHALATION_SPRAY | Freq: Four times a day (QID) | RESPIRATORY_TRACT | Status: DC | PRN

## 2021-01-08 MED ORDER — ACETAMINOPHEN 325 MG PO TABS: 650.0000 mg | ORAL_TABLET | ORAL | Status: DC | PRN

## 2021-01-08 MED ORDER — ASENAPINE MALEATE 5 MG SL SUBL
10.0000 mg | SUBLINGUAL_TABLET | Freq: Two times a day (BID) | SUBLINGUAL | Status: DC
  Filled 2021-01-08: qty 2

## 2021-01-08 NOTE — Discharge Instructions (Signed)
Take your current meds   See psychiatry for follow up   Return to ER if you have worse delusions, hallucinations, thoughts of harming yourself or others

## 2021-01-08 NOTE — ED Notes (Signed)
RN was walking out of another pt's room and heard pt's cardiac monitor alarming vtach, RN went into pt's room and acknowledge alarm, Pt was in NSR on the monitor. Pt told this RN that she couldn't be her nurse. Pt asked why her alarm was going off, RN explained sometimes the cardiac monitors alarm when pt's move in the bed, but that pt had a normal heart rhythm. Pt stated "you need to be watching my monitor, i've had 2 heart attacks, someone should be watching it", RN explained that I went into the room as soon as I heard it, but I was in another pt's room. Pt stated "I saw you in there but you should've come in here when you heard it." Rn explained again that I did go into the room as soon as I heard it, and that I have other patients that I was with. Pt stated "well you need to tell them you need help because you can't be watching my monitor while you have other patients, tell them I need a different nurse for just me". RN apologized and reiterated that I had done my job, and I would let the charge nurse know. Pt then stated "I can't have you stupid B*tch being my nurse". RN walked out of room. MD Gilford Raid and Charge RN aware.

## 2021-01-08 NOTE — ED Notes (Signed)
Pt called out and stated "I need a new nurse cause I'm not gonna have that B*tch talk about me", Gilford Raid MD aware.
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
## 2021-01-08 NOTE — ED Provider Notes (Signed)
°  Physical Exam  BP (!) 163/103    Pulse 85    Temp 99.3 F (37.4 C) (Oral)    Resp 17    SpO2 99%   Physical Exam  ED Course/Procedures     Procedures  MDM  Patient is here with delusions.  Patient was seen by psychiatry 2 days ago and was cleared by psychiatry.  Patient is here completely voluntarily.  Patient has delusions but no suicidal homicidal ideations.  She had some chest pain earlier but troponin is negative. Her chest pain is not consistent with ACS.  I was called because she wants to leave now and is very aggressive towards staff. She denies any suicidal or homicidal ideations.  [name] stable for discharge.  She can follow-up with psychiatry outpatient      Drenda Freeze, MD 01/08/21 [phone]
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
## 2021-01-08 NOTE — BH Assessment (Signed)
Called 1600 hours to assess patient although no one was available to set up the machine.

## 2021-01-08 NOTE — ED Notes (Signed)
Pt told the writer that she is "homicidal, suicidal and is hearing things that aren't there"

## 2021-01-08 NOTE — ED Notes (Signed)
Pt in doorway on rolling stool being belligerent and loud, cursing at staff. Pt is not redirectable. Security called to bedside by CN. MD notified by CN.
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
## 2021-01-08 NOTE — ED Provider Notes (Signed)
[facility] EMERGENCY DEPARTMENT Provider Note   CSN: [id number] Arrival date & time: 01/08/21  1301     History Chief Complaint  Patient presents with   Chest Pain    Tiffany [name] is a 60 y.o. female.  Pt presents to the ED today with cp and delusions.  She said there is so much going on with her.  She said she was at [facility] yesterday for a psych eval and was sent home.  She said she lives in a "tramp" house and there are several homeless people there that she wants to get away from.  She denied any current cp.      Past Medical History:  Diagnosis Date   Agitation 11/22/2017   Anoxic brain injury ([place]) 09/08/2016   C. Arrest due to respiratory failure and COPD exacerbation   Anxiety    Arthritis    "all over" (04/10/2016)   Asthma 10/18/2010   Binge eating disorder    Cardiac arrest ([place]) 09/08/2016   PEA   Carotid artery stenosis    1-39% bilateral by dopplers 11/2016   Chronic diastolic (congestive) heart failure (HCC)    Chronic kidney disease, stage 3 (HCC)    Chronic pain syndrome 06/18/2012   Chronic post-traumatic stress disorder (PTSD) 05/27/2018   Chronic respiratory failure with hypoxia and hypercapnia ([place]) 06/22/2015   TRILOGY Vent >AVAPA-ES., Vt target 200-400, Max P 30 , PS max 20 , PS min 6-10 , E Max 6, E Min 4, Rate Auto AVAPS Rate 2 (titrate for pt comfort) , bleed O2 at 5l/m continuous flow .    Closed displaced fracture of fifth metacarpal bone 03/21/2018   Cocaine use disorder, severe, in sustained remission ([place]) 67/61/9509   Complication of anesthesia    decreased bp, decreased heart rate   COPD (chronic obstructive pulmonary disease) ([place]) 07/08/2014   Depression    Diabetic neuropathy ([place]) 04/24/2011   Difficulty with speech 01/24/2018   Disorder of nervous system    Drug abuse ([place]) 11/21/2017   Dyslipidemia 04/24/2011   Elevated troponin 04/28/2012   Emphysema    Encephalopathy 11/21/2017   Essential hypertension  03/22/2016   Fibula fracture 07/10/2016   Frequent falls 10/11/2017   GERD (gastroesophageal reflux disease)    Gout 04/11/2017   Heart attack ([place]) 1980s   History of blood transfusion 1994   "couldn't stop bleeding from my period"   History of drug abuse in remission ([place]) 11/28/2015   Quit in 2017   Hyperlipidemia LDL goal <70    Incontinence    Manic depression ([place])    Morbid obesity ([place]) 10/18/2010   Obstructive sleep apnea 10/18/2010   On home oxygen therapy    "6L; 24/7" (04/10/2016)   OSA on CPAP    "wear mask sometimes" (04/10/2016)   Paranoid ([place])    "sometimes; I'm on RX for it" (04/10/2016)   Prolonged Q-T interval on ECG    Rectal bleeding 12/31/2015   Schizoaffective disorder, bipolar type ([place]) 04/05/2018   Seasonal allergies    Seborrheic keratoses 12/31/2013   Seizures ([place])    "don't know what kind; last one was ~ 1 yr ago" (04/10/2016)   Sinus bradycardia    Stroke [facility]) 1980s   denies residual on 04/10/2016   Thrush 09/19/2013   Type 2 diabetes mellitus ([place]) 10/18/2010    Patient Active Problem List   Diagnosis Date Noted   Neuropathy 11/18/2020   Pain in both feet 11/18/2020   Localized  edema 11/18/2020   Encounter for medication management 11/17/2020   Protein-calorie malnutrition, severe 05/15/2020   Malnutrition of moderate degree 36/64/4034   Periumbilical abdominal pain    Dysphagia    Nausea vomiting and diarrhea    Generalized abdominal pain    E. coli UTI 74/25/9563   Acute metabolic encephalopathy 87/56/4332   Psychosis ([place]) 02/20/2020   Schizophrenia, schizo-affective ([place]) 95/18/8416   Metabolic acidosis, normal anion gap (NAG) 02/20/2020   Altered mental status    Hypocalcemia    AKI (acute kidney injury) ([place]) 02/18/2020   Pelvic mass in female 01/02/2020   Genital herpes 11/25/2019   Stage 2 skin ulcer of sacral region ([place]) 10/28/2019   ARF (acute renal failure) ([place]) 10/27/2019   Family discord 02/04/2019   Aggressive  behavior    PTSD (post-traumatic stress disorder) 05/27/2018   Schizoaffective disorder, bipolar type ([place]) 04/05/2018   Closed displaced fracture of fifth metacarpal bone 03/21/2018   Difficulty with speech 01/24/2018   Encephalopathy 11/21/2017   Drug abuse ([place]) 11/21/2017   Frequent falls 10/11/2017   Binge eating disorder    Dependence on continuous supplemental oxygen 05/14/2017   Gout 04/11/2017   Hypomagnesemia    CKD (chronic kidney disease) stage 3, GFR 30-59 ml/min (HCC) 12/15/2016   Carotid artery stenosis    Hypokalemia    Osteoarthritis 10/26/2016   Anoxic brain injury ([place]) 09/08/2016   Overactive bladder 06/07/2016   QT prolongation    OSA and COPD overlap syndrome ([place])    Arthritis    Essential hypertension 03/22/2016   Cocaine use disorder, severe, in sustained remission ([place]) 12/17/2015   History of drug abuse in remission ([place]) 11/28/2015   Hyponatremia 11/25/2015   Chronic diastolic congestive heart failure (HCC)    Chronic respiratory failure with hypoxia ([place]) 06/22/2015   Tobacco use disorder 07/22/2014   COPD (chronic obstructive pulmonary disease) ([place]) 07/08/2014   Seizure ([place]) 01/04/2013   Chronic pain syndrome 06/18/2012   Dyslipidemia 04/24/2011   Anemia 04/24/2011   Diabetic neuropathy ([place]) 04/24/2011   Morbid obesity ([place]) 10/18/2010   Type 2 diabetes mellitus ([place]) 10/18/2010    Past Surgical History:  Procedure Laterality Date   CESAREAN SECTION  1997   HERNIA REPAIR     IR CHOLANGIOGRAM EXISTING TUBE  07/20/2016   IR PERC CHOLECYSTOSTOMY  05/10/2016   IR RADIOLOGIST EVAL & MGMT  06/08/2016   IR RADIOLOGIST EVAL & MGMT  06/29/2016   IR SINUS/FIST TUBE CHK-NON GI  07/12/2016   RIGHT/LEFT HEART CATH AND CORONARY ANGIOGRAPHY N/A 06/19/2017   Procedure: RIGHT/LEFT HEART CATH AND CORONARY ANGIOGRAPHY;  Surgeon: Jolaine Artist, MD;  Location: [facility];  Service: Cardiovascular;  Laterality: N/A;   TIBIA IM NAIL INSERTION Right  07/12/2016   Procedure: INTRAMEDULLARY (IM) NAIL RIGHT TIBIA;  Surgeon: Leandrew Koyanagi, MD;  Location: [facility];  Service: Orthopedics;  Laterality: Right;   UMBILICAL HERNIA REPAIR  ~ 1963   "that's why I don't have a belly button"   VAGINAL HYSTERECTOMY       OB History     Gravida  4   Para  4   Term  3   Preterm  1   AB  0   Living  3      SAB  0   IAB  0   Ectopic  0   Multiple  0   Live Births  3           Family  History  Problem Relation Age of Onset   Cancer Father        prostate   Cancer Mother        lung   Depression Mother    Depression Sister    Anxiety disorder Sister    Schizophrenia Sister    Bipolar disorder Sister    Depression Sister    Depression Brother    Heart failure Other        cousin    Social History   Tobacco Use   Smoking status: Former    Packs/day: 1.50    Years: 38.00    Pack years: 57.00    Types: Cigarettes    Start date: 03/13/1977    Quit date: 04/10/2016    Years since quitting: 4.7   Smokeless tobacco: Never  Vaping Use   Vaping Use: Never used  Substance Use Topics   Alcohol use: No    Alcohol/week: 0.0 standard drinks   Drug use: Not Currently    Types: Cocaine    Comment: 04/10/2016 "last used cocaine back in November 2017"    Home Medications Prior to Admission medications   Medication Sig Start Date End Date Taking? Authorizing Provider  albuterol (PROVENTIL) (2.5 MG/3ML) 0.083% nebulizer solution Take 3 mLs (2.5 mg total) by nebulization every 6 (six) hours as needed for wheezing or shortness of breath. 12/16/20   [name], MD  albuterol (VENTOLIN HFA) 108 (90 Base) MCG/ACT inhaler Inhale 2 puffs into the lungs every 6 (six) hours as needed for wheezing or shortness of breath. 12/16/20   [name], MD  amLODipine (NORVASC) 5 MG tablet Take 1 tablet (5 mg total) by mouth daily. 12/26/20 01/25/21  Henderly, Britni A, PA-C  asenapine (SAPHRIS) 5 MG SUBL 24 hr tablet Place 2 tablets (10 mg  total) under the tongue 2 (two) times daily. 11/17/20   Rankin, Shuvon B, NP  atorvastatin (LIPITOR) 20 MG tablet Take 20 mg by mouth at bedtime. 09/27/20   [provider]  colchicine 0.6 MG tablet Take 2 tabs (1.2 mg) orally at the onset of gout flare, may repeat 1 tab (0.6 mg) 1 hour if symptoms persist 12/26/20   Henderly, Britni A, PA-C  cyclobenzaprine (FLEXERIL) 10 MG tablet Take 1 tablet (10 mg total) by mouth 2 (two) times daily as needed. Patient taking differently: Take 10 mg by mouth 2 (two) times daily as needed for muscle spasms. 12/16/20   [name], MD  diazepam (VALIUM) 5 MG tablet Take 1 tablet (5 mg total) by mouth every 8 (eight) hours as needed for anxiety. Patient taking differently: Take by mouth 3 (three) times daily. 11/15/20 01/14/21  Cobos, Myer Peer, MD  diclofenac (VOLTAREN) 75 MG EC tablet TAKE 1 TABLET(75 MG) BY MOUTH TWICE DAILY AS NEEDED Strength: 75 mg Patient taking differently: Take 75 mg by mouth 2 (two) times daily as needed (for leg pain). 12/28/20   [name], MD  diclofenac Sodium (VOLTAREN) 1 % GEL Apply 4 g topically 4 (four) times daily. 12/28/20   [name], MD  dicyclomine (BENTYL) 10 MG capsule Take 1 capsule (10 mg total) by mouth 3 (three) times daily before meals. 12/24/20   [name], MD  escitalopram (LEXAPRO) 10 MG tablet Take 1 tablet (10 mg total) by mouth daily for 15 days. 12/26/20 01/10/21  Henderly, Britni A, PA-C  febuxostat (ULORIC) 40 MG tablet Take 1 tablet (40 mg total) by mouth daily. Patient taking differently: Take 40 mg by  mouth at bedtime. 10/15/20   Lacretia Leigh, MD  Fluticasone-Umeclidin-Vilant (TRELEGY ELLIPTA) 100-62.5-25 MCG/ACT AEPB Inhale 1 puff into the lungs daily. 12/16/20   [name], MD  furosemide (LASIX) 80 MG tablet Take 1 tablet (80 mg total) by mouth daily. 12/26/20   Henderly, Britni A, PA-C  gabapentin (NEURONTIN) 600 MG tablet Take 1 tablet (600 mg total) by mouth 2 (two) times  daily. 10/15/20   Lacretia Leigh, MD  Iron, Ferrous Sulfate, 325 (65 Fe) MG TABS Take 325 mg by mouth daily. 10/08/20   [name], MD  ketoconazole (NIZORAL) 2 % cream Apply 1 application topically daily. Apply to both feet 09/27/20   [provider]  lidocaine (LIDODERM) 5 % Place 1 patch onto the skin daily. Remove & Discard patch within 12 hours or as directed by MD 12/26/20   Henderly, Britni A, PA-C  lisdexamfetamine (VYVANSE) 20 MG capsule Take 20 mg by mouth daily.    [provider]  montelukast (SINGULAIR) 10 MG tablet Take 1 tablet (10 mg total) by mouth at bedtime. 12/16/20   [name], MD  Multiple Vitamin (MULTIVITAMIN WITH MINERALS) TABS tablet Take 1 tablet by mouth daily. 08/16/20   Mercy Riding, MD  omeprazole (PRILOSEC) 40 MG capsule TAKE 1 CAPSULE(40 MG) BY MOUTH DAILY Patient taking differently: Take 40 mg by mouth daily. 10/23/20   [name], MD  oxybutynin (DITROPAN) 5 MG tablet TAKE 1 TABLET(5 MG) BY MOUTH TWICE DAILY Patient taking differently: Take 5 mg by mouth 2 (two) times daily. 10/23/20   [name], MD  potassium chloride SA (KLOR-CON M) 20 MEQ tablet Take 2 tablets (40 mEq total) by mouth 2 (two) times daily. 12/16/20   [name], MD  prazosin (MINIPRESS) 2 MG capsule Take 1 capsule (2 mg total) by mouth at bedtime. 12/16/20   [name], MD  sodium chloride (OCEAN) 0.65 % SOLN nasal spray Place 2 sprays into both nostrils daily.    [provider]  terbinafine (LAMISIL AT) 1 % cream Apply 1 application topically 2 (two) times daily. Patient taking differently: Apply 1 application topically 3 (three) times daily. 09/07/20   [name], MD  thiamine 100 MG tablet Take 1 tablet (100 mg total) by mouth daily. 02/27/20   Hongalgi, Lenis Dickinson, MD    Allergies    Hydrocodone, Hydroxyzine, Anette Guarneri [lurasidone hcl], Magnesium-containing compounds, Prednisone, Tramadol, Codeine, Trazodone, Topamax [topiramate], Sulfa  antibiotics, and Tape  Review of Systems   Review of Systems  Cardiovascular:  Positive for chest pain.  All other systems reviewed and are negative.  Physical Exam Updated Vital Signs BP (!) 163/103    Pulse 85    Temp 99.3 F (37.4 C) (Oral)    Resp 17    SpO2 99%   Physical Exam Vitals and nursing note reviewed.  Constitutional:      Appearance: [name] well-developed.  HENT:     Head: Normocephalic and atraumatic.  Eyes:     Extraocular Movements: Extraocular movements intact.     Pupils: Pupils are equal, round, and reactive to light.  Cardiovascular:     Rate and Rhythm: Normal rate and regular rhythm.     Heart sounds: Normal heart sounds.  Pulmonary:     Effort: Pulmonary effort is normal.     Breath sounds: Normal breath sounds.  Abdominal:     General: Bowel sounds are normal.     Palpations: Abdomen is soft.  Musculoskeletal:  General: Normal range of motion.     Cervical back: Normal range of motion and neck supple.  Skin:    General: Skin is warm.     Capillary Refill: Capillary refill takes less than 2 seconds.  Neurological:     General: No focal deficit present.     Mental Status: [name] alert and oriented to person, place, and time.  Psychiatric:        Behavior: Behavior is agitated.    ED Results / Procedures / Treatments   Labs (all labs ordered are listed, but only abnormal results are displayed) Labs Reviewed  BASIC METABOLIC PANEL - Abnormal; Notable for the following components:      Result Value   Creatinine, Ser 1.45 (*)    GFR, Estimated 41 (*)    All other components within normal limits  CBC - Abnormal; Notable for the following components:   Hemoglobin 11.6 (*)    RDW 15.7 (*)    All other components within normal limits  RESP PANEL BY RT-PCR (FLU A&B, COVID) ARPGX2  ETHANOL  RAPID URINE DRUG SCREEN, HOSP PERFORMED  TROPONIN I (HIGH SENSITIVITY)  TROPONIN I (HIGH SENSITIVITY)    EKG EKG  Interpretation  Date/Time:  Saturday January 08 2021 13:15:57 EST Ventricular Rate:  94 PR Interval:  138 QRS Duration: 70 QT Interval:  370 QTC Calculation: 462 R Axis:   9 Text Interpretation: Normal sinus rhythm Cannot rule out Anterior infarct , age undetermined Abnormal ECG No significant change since last tracing Confirmed by Isla Pence [id number]) on 01/08/2021 2:10:59 PM  Radiology DG Chest 2 View  Result Date: 01/08/2021 CLINICAL DATA:  Chest pain and shortness of breath EXAM: CHEST - 2 VIEW COMPARISON:  12/26/2020 FINDINGS: Stable heart size and vascularity. Clear lungs. Negative for pneumonia, edema, effusion or pneumothorax. Left rib fractures noted with deformity. Artifact overlies the right upper lobe. Degenerative changes of the spine. Trachea midline. IMPRESSION: Stable exam. No active chest disease or interval change by plain radiography. Electronically Signed   By: Jerilynn Mages.  Shick M.D.   On: 01/08/2021 13:36    Procedures Procedures   Medications Ordered in ED Medications  acetaminophen (TYLENOL) tablet 650 mg (has no administration in time range)  albuterol (VENTOLIN HFA) 108 (90 Base) MCG/ACT inhaler 2 puff (has no administration in time range)  amLODipine (NORVASC) tablet 5 mg (has no administration in time range)  asenapine (SAPHRIS) sublingual tablet 10 mg (has no administration in time range)  atorvastatin (LIPITOR) tablet 20 mg (has no administration in time range)  cyclobenzaprine (FLEXERIL) tablet 10 mg (has no administration in time range)  diazepam (VALIUM) tablet 5 mg (has no administration in time range)  diclofenac (VOLTAREN) EC tablet 75 mg (has no administration in time range)  diclofenac Sodium (VOLTAREN) 1 % topical gel 4 g (has no administration in time range)  dicyclomine (BENTYL) capsule 10 mg (has no administration in time range)  escitalopram (LEXAPRO) tablet 10 mg (has no administration in time range)  Fluticasone-Umeclidin-Vilant 100-62.5-25  MCG/ACT AEPB 1 puff (has no administration in time range)  furosemide (LASIX) tablet 80 mg (has no administration in time range)  gabapentin (NEURONTIN) tablet 600 mg (has no administration in time range)  Iron (Ferrous Sulfate) TABS 325 mg (has no administration in time range)  lisdexamfetamine (VYVANSE) capsule 20 mg (has no administration in time range)  montelukast (SINGULAIR) tablet 10 mg (has no administration in time range)  multivitamin with minerals tablet 1 tablet (has no administration in  time range)  pantoprazole (PROTONIX) EC tablet 40 mg (has no administration in time range)  oxybutynin (DITROPAN) tablet 5 mg (has no administration in time range)  potassium chloride SA (KLOR-CON M) CR tablet 40 mEq (has no administration in time range)  prazosin (MINIPRESS) capsule 2 mg (has no administration in time range)    ED Course  I have reviewed the triage vital signs and the nursing notes.  Pertinent labs & imaging results that were available during my care of the patient were reviewed by me and considered in my medical decision making (see chart for details).    MDM Rules/Calculators/A&P                         Pt has been very aggressive and yelling at the nurses.  Her cardiac work up is negative.  [name] medically clear.  [name] cooperative with me.  Pt is voluntary.  TTS consulted.  Final Clinical Impression(s) / ED Diagnoses Final diagnoses:  Atypical chest pain  Delusions [facility])    Rx / DC Orders ED Discharge Orders     None        Isla Pence, MD 01/08/21 1610

## 2021-01-08 NOTE — ED Notes (Signed)
Per Conner, PA, pt is refusing to speak with him. Pt denies SI/HI so is free to walk.

## 2021-01-08 NOTE — ED Notes (Signed)
Answered call light and pt was verbally aggressive w/ this RN and cussing at this RN.

## 2021-01-08 NOTE — ED Provider Notes (Addendum)
[facility] DEPT Provider Note   CSN: [id number] Arrival date & time: 01/08/21  1835     History Chief Complaint  Patient presents with   Psychiatric Evaluation    Tiffany [name] is a 60 y.o. female who presents emergency department for a mental health evaluation.  Patient denies any suicidal homicidal ideations.  No auditory or visual hallucinations.  Patient states she suffers from PTSD and her symptoms are worse on the holidays.  Became uncooperative shortly into the interview and states that I was bothering her and wishes to go back up to the waiting room.  Patient was seen and evaluated by inpatient psychiatry on 01/06/2021 who medically cleared her as she did not meet inpatient psychiatric evaluation. At that time, she was instructed to follow up in the outpatient setting.    HPI     Past Medical History:  Diagnosis Date   Agitation 11/22/2017   Anoxic brain injury ([place]) 09/08/2016   C. Arrest due to respiratory failure and COPD exacerbation   Anxiety    Arthritis    "all over" (04/10/2016)   Asthma 10/18/2010   Binge eating disorder    Cardiac arrest ([place]) 09/08/2016   PEA   Carotid artery stenosis    1-39% bilateral by dopplers 11/2016   Chronic diastolic (congestive) heart failure (HCC)    Chronic kidney disease, stage 3 (HCC)    Chronic pain syndrome 06/18/2012   Chronic post-traumatic stress disorder (PTSD) 05/27/2018   Chronic respiratory failure with hypoxia and hypercapnia ([place]) 06/22/2015   TRILOGY Vent >AVAPA-ES., Vt target 200-400, Max P 30 , PS max 20 , PS min 6-10 , E Max 6, E Min 4, Rate Auto AVAPS Rate 2 (titrate for pt comfort) , bleed O2 at 5l/m continuous flow .    Closed displaced fracture of fifth metacarpal bone 03/21/2018   Cocaine use disorder, severe, in sustained remission ([place]) 92/01/69   Complication of anesthesia    decreased bp, decreased heart rate   COPD (chronic obstructive pulmonary disease) ([place])  07/08/2014   Depression    Diabetic neuropathy ([place]) 04/24/2011   Difficulty with speech 01/24/2018   Disorder of nervous system    Drug abuse ([place]) 11/21/2017   Dyslipidemia 04/24/2011   Elevated troponin 04/28/2012   Emphysema    Encephalopathy 11/21/2017   Essential hypertension 03/22/2016   Fibula fracture 07/10/2016   Frequent falls 10/11/2017   GERD (gastroesophageal reflux disease)    Gout 04/11/2017   Heart attack ([place]) 1980s   History of blood transfusion 1994   "couldn't stop bleeding from my period"   History of drug abuse in remission ([place]) 11/28/2015   Quit in 2017   Hyperlipidemia LDL goal <70    Incontinence    Manic depression ([place])    Morbid obesity ([place]) 10/18/2010   Obstructive sleep apnea 10/18/2010   On home oxygen therapy    "6L; 24/7" (04/10/2016)   OSA on CPAP    "wear mask sometimes" (04/10/2016)   Paranoid ([place])    "sometimes; I'm on RX for it" (04/10/2016)   Prolonged Q-T interval on ECG    Rectal bleeding 12/31/2015   Schizoaffective disorder, bipolar type ([place]) 04/05/2018   Seasonal allergies    Seborrheic keratoses 12/31/2013   Seizures ([place])    "don't know what kind; last one was ~ 1 yr ago" (04/10/2016)   Sinus bradycardia    Stroke [facility]) 1980s   denies residual on 04/10/2016   Thrush 09/19/2013  Type 2 diabetes mellitus ([place]) 10/18/2010    Patient Active Problem List   Diagnosis Date Noted   Neuropathy 11/18/2020   Pain in both feet 11/18/2020   Localized edema 11/18/2020   Encounter for medication management 11/17/2020   Protein-calorie malnutrition, severe 05/15/2020   Malnutrition of moderate degree 73/53/2992   Periumbilical abdominal pain    Dysphagia    Nausea vomiting and diarrhea    Generalized abdominal pain    E. coli UTI 42/68/3419   Acute metabolic encephalopathy 62/22/9798   Psychosis ([place]) 02/20/2020   Schizophrenia, schizo-affective ([place]) 92/11/9415   Metabolic acidosis, normal anion gap (NAG) 02/20/2020    Altered mental status    Hypocalcemia    AKI (acute kidney injury) ([place]) 02/18/2020   Pelvic mass in female 01/02/2020   Genital herpes 11/25/2019   Stage 2 skin ulcer of sacral region ([place]) 10/28/2019   ARF (acute renal failure) ([place]) 10/27/2019   Family discord 02/04/2019   Aggressive behavior    PTSD (post-traumatic stress disorder) 05/27/2018   Schizoaffective disorder, bipolar type ([place]) 04/05/2018   Closed displaced fracture of fifth metacarpal bone 03/21/2018   Difficulty with speech 01/24/2018   Encephalopathy 11/21/2017   Drug abuse ([place]) 11/21/2017   Frequent falls 10/11/2017   Binge eating disorder    Dependence on continuous supplemental oxygen 05/14/2017   Gout 04/11/2017   Hypomagnesemia    CKD (chronic kidney disease) stage 3, GFR 30-59 ml/min (HCC) 12/15/2016   Carotid artery stenosis    Hypokalemia    Osteoarthritis 10/26/2016   Anoxic brain injury ([place]) 09/08/2016   Overactive bladder 06/07/2016   QT prolongation    OSA and COPD overlap syndrome ([place])    Arthritis    Essential hypertension 03/22/2016   Cocaine use disorder, severe, in sustained remission ([place]) 12/17/2015   History of drug abuse in remission ([place]) 11/28/2015   Hyponatremia 11/25/2015   Chronic diastolic congestive heart failure (HCC)    Chronic respiratory failure with hypoxia ([place]) 06/22/2015   Tobacco use disorder 07/22/2014   COPD (chronic obstructive pulmonary disease) ([place]) 07/08/2014   Seizure ([place]) 01/04/2013   Chronic pain syndrome 06/18/2012   Dyslipidemia 04/24/2011   Anemia 04/24/2011   Diabetic neuropathy ([place]) 04/24/2011   Morbid obesity ([place]) 10/18/2010   Type 2 diabetes mellitus ([place]) 10/18/2010    Past Surgical History:  Procedure Laterality Date   CESAREAN SECTION  1997   HERNIA REPAIR     IR CHOLANGIOGRAM EXISTING TUBE  07/20/2016   IR PERC CHOLECYSTOSTOMY  05/10/2016   IR RADIOLOGIST EVAL & MGMT  06/08/2016   IR RADIOLOGIST EVAL & MGMT  06/29/2016   IR SINUS/FIST  TUBE CHK-NON GI  07/12/2016   RIGHT/LEFT HEART CATH AND CORONARY ANGIOGRAPHY N/A 06/19/2017   Procedure: RIGHT/LEFT HEART CATH AND CORONARY ANGIOGRAPHY;  Surgeon: Jolaine Artist, MD;  Location: [facility];  Service: Cardiovascular;  Laterality: N/A;   TIBIA IM NAIL INSERTION Right 07/12/2016   Procedure: INTRAMEDULLARY (IM) NAIL RIGHT TIBIA;  Surgeon: Leandrew Koyanagi, MD;  Location: [facility];  Service: Orthopedics;  Laterality: Right;   UMBILICAL HERNIA REPAIR  ~ 1963   "that's why I don't have a belly button"   VAGINAL HYSTERECTOMY       OB History     Gravida  4   Para  4   Term  3   Preterm  1   AB  0   Living  3      SAB  0  IAB  0   Ectopic  0   Multiple  0   Live Births  3           Family History  Problem Relation Age of Onset   Cancer Father        prostate   Cancer Mother        lung   Depression Mother    Depression Sister    Anxiety disorder Sister    Schizophrenia Sister    Bipolar disorder Sister    Depression Sister    Depression Brother    Heart failure Other        cousin    Social History   Tobacco Use   Smoking status: Former    Packs/day: 1.50    Years: 38.00    Pack years: 57.00    Types: Cigarettes    Start date: 03/13/1977    Quit date: 04/10/2016    Years since quitting: 4.7   Smokeless tobacco: Never  Vaping Use   Vaping Use: Never used  Substance Use Topics   Alcohol use: No    Alcohol/week: 0.0 standard drinks   Drug use: Not Currently    Types: Cocaine    Comment: 04/10/2016 "last used cocaine back in November 2017"    Home Medications Prior to Admission medications   Medication Sig Start Date End Date Taking? Authorizing Provider  albuterol (PROVENTIL) (2.5 MG/3ML) 0.083% nebulizer solution Take 3 mLs (2.5 mg total) by nebulization every 6 (six) hours as needed for wheezing or shortness of breath. 12/16/20   [name], MD  albuterol (VENTOLIN HFA) 108 (90 Base) MCG/ACT inhaler Inhale 2 puffs into  the lungs every 6 (six) hours as needed for wheezing or shortness of breath. 12/16/20   [name], MD  amLODipine (NORVASC) 5 MG tablet Take 1 tablet (5 mg total) by mouth daily. 12/26/20 01/25/21  Henderly, Britni A, PA-C  asenapine (SAPHRIS) 5 MG SUBL 24 hr tablet Place 2 tablets (10 mg total) under the tongue 2 (two) times daily. 11/17/20   Rankin, Shuvon B, NP  atorvastatin (LIPITOR) 20 MG tablet Take 20 mg by mouth at bedtime. 09/27/20   [provider]  colchicine 0.6 MG tablet Take 2 tabs (1.2 mg) orally at the onset of gout flare, may repeat 1 tab (0.6 mg) 1 hour if symptoms persist 12/26/20   Henderly, Britni A, PA-C  cyclobenzaprine (FLEXERIL) 10 MG tablet Take 1 tablet (10 mg total) by mouth 2 (two) times daily as needed. Patient taking differently: Take 10 mg by mouth 2 (two) times daily as needed for muscle spasms. 12/16/20   [name], MD  diazepam (VALIUM) 5 MG tablet Take 1 tablet (5 mg total) by mouth every 8 (eight) hours as needed for anxiety. Patient taking differently: Take by mouth 3 (three) times daily. 11/15/20 01/14/21  Cobos, Myer Peer, MD  diclofenac (VOLTAREN) 75 MG EC tablet TAKE 1 TABLET(75 MG) BY MOUTH TWICE DAILY AS NEEDED Strength: 75 mg Patient taking differently: Take 75 mg by mouth 2 (two) times daily as needed (for leg pain). 12/28/20   [name], MD  diclofenac Sodium (VOLTAREN) 1 % GEL Apply 4 g topically 4 (four) times daily. 12/28/20   [name], MD  dicyclomine (BENTYL) 10 MG capsule Take 1 capsule (10 mg total) by mouth 3 (three) times daily before meals. 12/24/20   [name], MD  escitalopram (LEXAPRO) 10 MG tablet Take 1 tablet (10 mg total) by mouth daily for 15  days. 12/26/20 01/10/21  Henderly, Britni A, PA-C  febuxostat (ULORIC) 40 MG tablet Take 1 tablet (40 mg total) by mouth daily. Patient taking differently: Take 40 mg by mouth at bedtime. 10/15/20   Lacretia Leigh, MD  Fluticasone-Umeclidin-Vilant (TRELEGY ELLIPTA)  100-62.5-25 MCG/ACT AEPB Inhale 1 puff into the lungs daily. 12/16/20   [name], MD  furosemide (LASIX) 80 MG tablet Take 1 tablet (80 mg total) by mouth daily. 12/26/20   Henderly, Britni A, PA-C  gabapentin (NEURONTIN) 600 MG tablet Take 1 tablet (600 mg total) by mouth 2 (two) times daily. 10/15/20   Lacretia Leigh, MD  Iron, Ferrous Sulfate, 325 (65 Fe) MG TABS Take 325 mg by mouth daily. 10/08/20   [name], MD  ketoconazole (NIZORAL) 2 % cream Apply 1 application topically daily. Apply to both feet 09/27/20   [provider]  lidocaine (LIDODERM) 5 % Place 1 patch onto the skin daily. Remove & Discard patch within 12 hours or as directed by MD 12/26/20   Henderly, Britni A, PA-C  lisdexamfetamine (VYVANSE) 20 MG capsule Take 20 mg by mouth daily.    [provider]  montelukast (SINGULAIR) 10 MG tablet Take 1 tablet (10 mg total) by mouth at bedtime. 12/16/20   [name], MD  Multiple Vitamin (MULTIVITAMIN WITH MINERALS) TABS tablet Take 1 tablet by mouth daily. 08/16/20   Mercy Riding, MD  omeprazole (PRILOSEC) 40 MG capsule TAKE 1 CAPSULE(40 MG) BY MOUTH DAILY Patient taking differently: Take 40 mg by mouth daily. 10/23/20   [name], MD  oxybutynin (DITROPAN) 5 MG tablet TAKE 1 TABLET(5 MG) BY MOUTH TWICE DAILY Patient taking differently: Take 5 mg by mouth 2 (two) times daily. 10/23/20   [name], MD  potassium chloride SA (KLOR-CON M) 20 MEQ tablet Take 2 tablets (40 mEq total) by mouth 2 (two) times daily. 12/16/20   [name], MD  prazosin (MINIPRESS) 2 MG capsule Take 1 capsule (2 mg total) by mouth at bedtime. 12/16/20   [name], MD  sodium chloride (OCEAN) 0.65 % SOLN nasal spray Place 2 sprays into both nostrils daily.    [provider]  terbinafine (LAMISIL AT) 1 % cream Apply 1 application topically 2 (two) times daily. Patient taking differently: Apply 1 application topically 3 (three) times daily. 09/07/20    [name], MD  thiamine 100 MG tablet Take 1 tablet (100 mg total) by mouth daily. 02/27/20   Hongalgi, Lenis Dickinson, MD    Allergies    Hydrocodone, Hydroxyzine, Anette Guarneri [lurasidone hcl], Magnesium-containing compounds, Prednisone, Tramadol, Codeine, Trazodone, Topamax [topiramate], Sulfa antibiotics, and Tape  Review of Systems   Review of Systems  All other systems reviewed and are negative.  Physical Exam Updated Vital Signs BP (!) 149/106 (BP Location: Right Arm)    Pulse 83    Temp 97.7 F (36.5 C) (Oral)    Resp 16    Ht 5\' 2"  (1.575 m)    Wt 52.2 kg    SpO2 100%    BMI 21.03 kg/m   Physical Exam Vitals and nursing note reviewed.  Constitutional:      Appearance: Normal appearance.  HENT:     Head: Normocephalic and atraumatic.  Eyes:     General:        Right eye: No discharge.        Left eye: No discharge.     Conjunctiva/sclera: Conjunctivae normal.  Pulmonary:     Effort: Pulmonary effort is normal.  Skin:    General: Skin is warm and dry.     Findings: No rash.  Neurological:     General: No focal deficit present.     Mental Status: [name] alert.  Psychiatric:        Mood and Affect: Mood normal.        Behavior: Behavior normal.    ED Results / Procedures / Treatments   Labs (all labs ordered are listed, but only abnormal results are displayed) Labs Reviewed - No data to display  EKG None  Radiology DG Chest 2 View  Result Date: 01/08/2021 CLINICAL DATA:  Chest pain and shortness of breath EXAM: CHEST - 2 VIEW COMPARISON:  12/26/2020 FINDINGS: Stable heart size and vascularity. Clear lungs. Negative for pneumonia, edema, effusion or pneumothorax. Left rib fractures noted with deformity. Artifact overlies the right upper lobe. Degenerative changes of the spine. Trachea midline. IMPRESSION: Stable exam. No active chest disease or interval change by plain radiography. Electronically Signed   By: Jerilynn Mages.  Shick M.D.   On: 01/08/2021 13:36     Procedures Procedures   Medications Ordered in ED Medications - No data to display  ED Course  I have reviewed the triage vital signs and the nursing notes.  Pertinent labs & imaging results that were available during my care of the patient were reviewed by me and considered in my medical decision making (see chart for details).    MDM Rules/Calculators/A&P                          TYQUASIA PANT is a 60 y.o. female who presents the emergency department for mental health evaluation.  Patient became disgruntled to the quickly into my interview.  She became uncooperative and accusing me of not listening to her.  She then became uncooperative and wishes to go back out to the waiting room.  When I talked with her about if she did follow-up with the outpatient psychiatry team she became extremely agitated.  From my standpoint, patient does not meet inpatient criteria at this time.  [name] not suicidal or homicidal.  Old records reviewed show that she is followed by [facility] counseling.  I asked if she would like to speak with another provider and patient did not answer.  She then told me to leave her alone and to leave the room.  Patient went back out to the waiting room and then stated she wanted to come back into the triage room.  I expressed that we might have gotten off on the wrong foot and apologize if I defended her in any way.  Patient wishes to talk with the psychiatry team.  From my standpoint, she is medically cleared.  I will be using her work-up that was done earlier today over at [facility].  Disposition to be made by oncoming provider and follow TTS recommendations.     Final Clinical Impression(s) / ED Diagnoses Final diagnoses:  None    Rx / DC Orders ED Discharge Orders     None        Hendricks Limes, PA-C 01/08/21 2014    Hendricks Limes, PA-C 01/08/21 2030    Tegeler, Gwenyth Allegra, MD 01/08/21 [phone]

## 2021-01-08 NOTE — ED Notes (Signed)
Pt repeatedly yelling, "Give me my damn walker!"
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
## 2021-01-08 NOTE — ED Notes (Signed)
Phlebotomy at bedside to get labs, pt refused more labs.

## 2021-01-09 DIAGNOSIS — F250 Schizoaffective disorder, bipolar type: Secondary | ICD-10-CM | POA: Diagnosis not present

## 2021-01-09 NOTE — ED Notes (Signed)
Pt states she is tired of waiting. Pt states nothing is being done for her. Pt informed of plan of care. Pt continuing to state that she needs help now. When how I can help her she states she needs a Mental Health Specialist. Plan of Care reviewed with pt again. Pt states she needs medications. When asked what medications she needs, pt will not answer question.

## 2021-01-09 NOTE — ED Notes (Signed)
Patient has 1 patient belonging bag across from room 18.

## 2021-01-09 NOTE — ED Notes (Signed)
Pt states she is not receiving the care she deserves because she was given Xanax instead of Valium and is a "confidential patient and you are not following my rights." Upon further questioning pt states, "I should have a bed and food and blankets." Pt has been provided with numerous refills of drink and food. Pt currently has 3 blankets, and pt requested to be assisted from the recliner to the wheelchair which she is currently sitting in. Writer of this note offered to move patient back into the recliner, patient refused. Pt was provided with a fresh soda as requested. Pt informed on current plan of care. Pt currently denies SI/HI. Pt states she needs medications for her nerves.

## 2021-01-09 NOTE — ED Notes (Signed)
Patient crying out that she cannot stay here no longer she needs to go home.

## 2021-01-11 ENCOUNTER — Other Ambulatory Visit: Payer: Self-pay | Admitting: Family Medicine

## 2021-01-12 ENCOUNTER — Ambulatory Visit (INDEPENDENT_AMBULATORY_CARE_PROVIDER_SITE_OTHER)
Admission: EM | Admit: 2021-01-12 | Discharge: 2021-01-12 | Disposition: A | Discharge status: Other Acute Inpatient Hospital | Payer: 59 | Source: Home / Self Care

## 2021-01-12 ENCOUNTER — Other Ambulatory Visit: Payer: Self-pay

## 2021-01-12 ENCOUNTER — Emergency Department (HOSPITAL_COMMUNITY): Payer: 59

## 2021-01-12 ENCOUNTER — Telehealth: Payer: Self-pay | Admitting: Family Medicine

## 2021-01-12 ENCOUNTER — Ambulatory Visit (HOSPITAL_COMMUNITY)
Admission: RE | Admit: 2021-01-12 | Discharge: 2021-01-12 | Disposition: A | Discharge status: Home / Self Care | Payer: 59 | Attending: Psychiatry | Admitting: Psychiatry

## 2021-01-12 ENCOUNTER — Encounter (HOSPITAL_COMMUNITY): Payer: Self-pay

## 2021-01-12 ENCOUNTER — Observation Stay (HOSPITAL_COMMUNITY)
Admission: EM | Admit: 2021-01-12 | Discharge: 2021-01-14 | Disposition: A | Discharge status: Home / Self Care | Payer: 59 | Attending: Student | Admitting: Student

## 2021-01-12 DIAGNOSIS — J449 Chronic obstructive pulmonary disease, unspecified: Secondary | ICD-10-CM | POA: Insufficient documentation

## 2021-01-12 DIAGNOSIS — Z79899 Other long term (current) drug therapy: Secondary | ICD-10-CM | POA: Insufficient documentation

## 2021-01-12 DIAGNOSIS — R45851 Suicidal ideations: Secondary | ICD-10-CM

## 2021-01-12 DIAGNOSIS — N1831 Chronic kidney disease, stage 3a: Secondary | ICD-10-CM | POA: Insufficient documentation

## 2021-01-12 DIAGNOSIS — Z87891 Personal history of nicotine dependence: Secondary | ICD-10-CM | POA: Insufficient documentation

## 2021-01-12 DIAGNOSIS — J45909 Unspecified asthma, uncomplicated: Secondary | ICD-10-CM | POA: Diagnosis not present

## 2021-01-12 DIAGNOSIS — I10 Essential (primary) hypertension: Secondary | ICD-10-CM | POA: Diagnosis present

## 2021-01-12 DIAGNOSIS — R0789 Other chest pain: Principal | ICD-10-CM | POA: Insufficient documentation

## 2021-01-12 DIAGNOSIS — Z20822 Contact with and (suspected) exposure to covid-19: Secondary | ICD-10-CM | POA: Diagnosis not present

## 2021-01-12 DIAGNOSIS — G629 Polyneuropathy, unspecified: Secondary | ICD-10-CM

## 2021-01-12 DIAGNOSIS — I130 Hypertensive heart and chronic kidney disease with heart failure and stage 1 through stage 4 chronic kidney disease, or unspecified chronic kidney disease: Secondary | ICD-10-CM | POA: Insufficient documentation

## 2021-01-12 DIAGNOSIS — E876 Hypokalemia: Secondary | ICD-10-CM

## 2021-01-12 DIAGNOSIS — E785 Hyperlipidemia, unspecified: Secondary | ICD-10-CM | POA: Diagnosis present

## 2021-01-12 DIAGNOSIS — M109 Gout, unspecified: Secondary | ICD-10-CM

## 2021-01-12 DIAGNOSIS — K219 Gastro-esophageal reflux disease without esophagitis: Secondary | ICD-10-CM | POA: Diagnosis present

## 2021-01-12 DIAGNOSIS — J42 Unspecified chronic bronchitis: Secondary | ICD-10-CM

## 2021-01-12 DIAGNOSIS — I5032 Chronic diastolic (congestive) heart failure: Secondary | ICD-10-CM | POA: Diagnosis not present

## 2021-01-12 DIAGNOSIS — E1122 Type 2 diabetes mellitus with diabetic chronic kidney disease: Secondary | ICD-10-CM | POA: Insufficient documentation

## 2021-01-12 DIAGNOSIS — R079 Chest pain, unspecified: Secondary | ICD-10-CM | POA: Insufficient documentation

## 2021-01-12 DIAGNOSIS — R030 Elevated blood-pressure reading, without diagnosis of hypertension: Secondary | ICD-10-CM | POA: Insufficient documentation

## 2021-01-12 DIAGNOSIS — F250 Schizoaffective disorder, bipolar type: Secondary | ICD-10-CM | POA: Insufficient documentation

## 2021-01-12 DIAGNOSIS — R52 Pain, unspecified: Secondary | ICD-10-CM

## 2021-01-12 DIAGNOSIS — R7989 Other specified abnormal findings of blood chemistry: Secondary | ICD-10-CM

## 2021-01-12 DIAGNOSIS — R778 Other specified abnormalities of plasma proteins: Secondary | ICD-10-CM

## 2021-01-12 LAB — BASIC METABOLIC PANEL
Anion gap: 11 (ref 5–15)
BUN: 15 mg/dL (ref 8–23)
CO2: 21 mmol/L — ABNORMAL LOW (ref 22–32)
Calcium: 9.5 mg/dL (ref 8.9–10.3)
Chloride: 104 mmol/L (ref 98–111)
Creatinine, Ser: 1.49 mg/dL — ABNORMAL HIGH (ref 0.44–1.00)
GFR, Estimated: 40 mL/min — ABNORMAL LOW (ref >60)
Glucose, Bld: 112 mg/dL — ABNORMAL HIGH (ref 70–99)
Potassium: 2.8 mmol/L — ABNORMAL LOW (ref 3.5–5.1)
Sodium: 136 mmol/L (ref 135–145)

## 2021-01-12 LAB — CBC WITH DIFFERENTIAL/PLATELET
Abs Immature Granulocytes: 0.04 10*3/uL (ref 0.00–0.07)
Basophils Absolute: 0 10*3/uL (ref 0.0–0.1)
Basophils Relative: 1 %
Eosinophils Absolute: 0 10*3/uL (ref 0.0–0.5)
Eosinophils Relative: 0 %
HCT: 37.8 % (ref 36.0–46.0)
Hemoglobin: 11.9 g/dL — ABNORMAL LOW (ref 12.0–15.0)
Immature Granulocytes: 1 %
Lymphocytes Relative: 22 %
Lymphs Abs: 1.5 10*3/uL (ref 0.7–4.0)
MCH: 28.2 pg (ref 26.0–34.0)
MCHC: 31.5 g/dL (ref 30.0–36.0)
MCV: 89.6 fL (ref 80.0–100.0)
Monocytes Absolute: 0.7 10*3/uL (ref 0.1–1.0)
Monocytes Relative: 11 %
Neutro Abs: 4.5 10*3/uL (ref 1.7–7.7)
Neutrophils Relative %: 65 %
Platelets: 347 10*3/uL (ref 150–400)
RBC: 4.22 MIL/uL (ref 3.87–5.11)
RDW: 15.8 % — ABNORMAL HIGH (ref 11.5–15.5)
WBC: 6.8 10*3/uL (ref 4.0–10.5)
nRBC: 0 % (ref 0.0–0.2)

## 2021-01-12 LAB — TROPONIN I (HIGH SENSITIVITY): Troponin I (High Sensitivity): 19 ng/L — ABNORMAL HIGH (ref <18)

## 2021-01-12 MED ORDER — AMLODIPINE BESYLATE 5 MG PO TABS
10.0000 mg | ORAL_TABLET | Freq: Once | ORAL | Status: AC
  Administered 2021-01-12: 21:00:00 10 mg via ORAL
  Filled 2021-01-12: qty 2

## 2021-01-12 MED ORDER — LOSARTAN POTASSIUM 25 MG PO TABS
50.0000 mg | ORAL_TABLET | Freq: Once | ORAL | Status: AC
Start: 2021-01-12 — End: 2021-01-12
  Administered 2021-01-12: 50 mg via ORAL
  Filled 2021-01-12: qty 2

## 2021-01-12 MED ORDER — ZIPRASIDONE MESYLATE 20 MG IM SOLR
20.0000 mg | Freq: Once | INTRAMUSCULAR | Status: AC
  Administered 2021-01-12: 21:00:00 20 mg via INTRAMUSCULAR
  Filled 2021-01-12: qty 20

## 2021-01-12 MED ORDER — POTASSIUM CHLORIDE CRYS ER 20 MEQ PO TBCR
40.0000 meq | EXTENDED_RELEASE_TABLET | Freq: Once | ORAL | Status: AC
  Administered 2021-01-12: 21:00:00 40 meq via ORAL
  Filled 2021-01-12: qty 2

## 2021-01-12 MED ORDER — LORAZEPAM 1 MG PO TABS
2.0000 mg | ORAL_TABLET | Freq: Once | ORAL | Status: DC
  Filled 2021-01-12: qty 2

## 2021-01-12 MED ORDER — IBUPROFEN 200 MG PO TABS
600.0000 mg | ORAL_TABLET | Freq: Once | ORAL | Status: AC
  Administered 2021-01-12: 19:00:00 600 mg via ORAL
  Filled 2021-01-12: qty 3

## 2021-01-12 MED ORDER — STERILE WATER FOR INJECTION IJ SOLN
INTRAMUSCULAR | Status: AC
  Filled 2021-01-12: qty 10

## 2021-01-12 MED ORDER — HYDRALAZINE HCL 25 MG PO TABS: 25.0000 mg | ORAL_TABLET | Freq: Once | ORAL | Status: DC

## 2021-01-12 NOTE — ED Triage Notes (Signed)
[facility] picked her up from [facility] and she was involuntarily admitted.  She was about to be transported to a [facility] and chest pain  Stats were 100 on room air.  EMS states she was hyperventilating and panicking bc she thought EMS was trying to kill her  5mg  versed was given IM and soft restraints on arms until she calmed down  12 lead NSR  213/110 bp 100 hr 100 on room air

## 2021-01-12 NOTE — ED Notes (Addendum)
Pt changed into burgundy scrubs. Pt belonging's placed in 2 bags. Belongings include: coat, shoes, watch, pair of glasses, sweater, and jeans. Per RN, defer wand by security d/t medication. Will re-attempt wand when appropriate.

## 2021-01-12 NOTE — ED Notes (Addendum)
@   1636: called to back sally port for patient by security and police "need a nurse now" @ 1637: upon arrival, patient noted to be tachypneic in seated position leaning forward in tripod position. Able to speak in 1-2 words between breaths. Patient states she has CP and cannot breath either. Patient reports that she feels like she "doesn't feel right" and when asked if this felt like previous episodes she said "yes like when I had a heart attack". Per Animal nutritionist and police officer at side "EMS has already been called and is en route". VS taken and were: bp 213/168, HR 135, RR 45, sp02 variable 91-100%.  @ 1638: this RN requested security notify [facility] nurses or CN about situation since patient is to be transferred to [facility] and what should be done. @ 1646: EMS arrived. Briefed paramedics on what this RN had encountered since being called to patient's side and patient's medical history @ 1648: EMS paramedics placed patient on stretcher assumed care of patient and departed. @ 1656: Discuss with NP regarding what had happened; unsure who had called for EMS transport and that patient had VS as noted above and active CP. Cecily NP states that is acceptable to send patient to closest ER for that condition. Patient was sent to [facility]

## 2021-01-12 NOTE — ED Provider Notes (Addendum)
[name] DEPT Provider Note   CSN: [id number] Arrival date & time: 01/12/21  1727     History Chief Complaint  Patient presents with   Shortness of Breath   Chest Pain    Tiffany [name] is a 60 y.o. female.  Patient presented chief complaint of chest pain shortness of breath and anxiety.  She was sent here from the behavioral health unit.  She had thoughts of suicide, and was being taken to a facility for inpatient psychiatric care when she felt more anxious had paranoid thoughts and developed chest pain.  Upon arrival to the ER she states that she still has some chest discomfort mid chest nonradiating.  She states she has a history of MI in the past.  Denies fevers or cough or vomiting diarrhea no shortness of breath reported.      Past Medical History:  Diagnosis Date   Agitation 11/22/2017   Anoxic brain injury ([place]) 09/08/2016   C. Arrest due to respiratory failure and COPD exacerbation   Anxiety    Arthritis    "all over" (04/10/2016)   Asthma 10/18/2010   Binge eating disorder    Cardiac arrest ([place]) 09/08/2016   PEA   Carotid artery stenosis    1-39% bilateral by dopplers 11/2016   Chronic diastolic (congestive) heart failure (HCC)    Chronic kidney disease, stage 3 (HCC)    Chronic pain syndrome 06/18/2012   Chronic post-traumatic stress disorder (PTSD) 05/27/2018   Chronic respiratory failure with hypoxia and hypercapnia ([place]) 06/22/2015   TRILOGY Vent >AVAPA-ES., Vt target 200-400, Max P 30 , PS max 20 , PS min 6-10 , E Max 6, E Min 4, Rate Auto AVAPS Rate 2 (titrate for pt comfort) , bleed O2 at 5l/m continuous flow .    Closed displaced fracture of fifth metacarpal bone 03/21/2018   Cocaine use disorder, severe, in sustained remission ([place]) 01/18/7251   Complication of anesthesia    decreased bp, decreased heart rate   COPD (chronic obstructive pulmonary disease) ([place]) 07/08/2014   Depression    Diabetic neuropathy ([place])  04/24/2011   Difficulty with speech 01/24/2018   Disorder of nervous system    Drug abuse ([place]) 11/21/2017   Dyslipidemia 04/24/2011   Elevated troponin 04/28/2012   Emphysema    Encephalopathy 11/21/2017   Essential hypertension 03/22/2016   Fibula fracture 07/10/2016   Frequent falls 10/11/2017   GERD (gastroesophageal reflux disease)    Gout 04/11/2017   Heart attack ([place]) 1980s   History of blood transfusion 1994   "couldn't stop bleeding from my period"   History of drug abuse in remission ([place]) 11/28/2015   Quit in 2017   Hyperlipidemia LDL goal <70    Incontinence    Manic depression ([place])    Morbid obesity ([place]) 10/18/2010   Obstructive sleep apnea 10/18/2010   On home oxygen therapy    "6L; 24/7" (04/10/2016)   OSA on CPAP    "wear mask sometimes" (04/10/2016)   Paranoid ([place])    "sometimes; I'm on RX for it" (04/10/2016)   Prolonged Q-T interval on ECG    Rectal bleeding 12/31/2015   Schizoaffective disorder, bipolar type ([place]) 04/05/2018   Seasonal allergies    Seborrheic keratoses 12/31/2013   Seizures ([place])    "don't know what kind; last one was ~ 1 yr ago" (04/10/2016)   Sinus bradycardia    Stroke [facility]) 1980s   denies residual on 04/10/2016   Thrush 09/19/2013  Type 2 diabetes mellitus ([place]) 10/18/2010    Patient Active Problem List   Diagnosis Date Noted   Neuropathy 11/18/2020   Pain in both feet 11/18/2020   Localized edema 11/18/2020   Encounter for medication management 11/17/2020   Protein-calorie malnutrition, severe 05/15/2020   Malnutrition of moderate degree 93/79/0240   Periumbilical abdominal pain    Dysphagia    Nausea vomiting and diarrhea    Generalized abdominal pain    E. coli UTI 97/35/3299   Acute metabolic encephalopathy 24/26/8341   Psychosis ([place]) 02/20/2020   Schizophrenia, schizo-affective ([place]) 96/22/2979   Metabolic acidosis, normal anion gap (NAG) 02/20/2020   Altered mental status    Hypocalcemia    AKI (acute kidney  injury) ([place]) 02/18/2020   Pelvic mass in female 01/02/2020   Genital herpes 11/25/2019   Stage 2 skin ulcer of sacral region ([place]) 10/28/2019   ARF (acute renal failure) ([place]) 10/27/2019   Family discord 02/04/2019   Aggressive behavior    PTSD (post-traumatic stress disorder) 05/27/2018   Schizoaffective disorder, bipolar type ([place]) 04/05/2018   Closed displaced fracture of fifth metacarpal bone 03/21/2018   Difficulty with speech 01/24/2018   Encephalopathy 11/21/2017   Drug abuse ([place]) 11/21/2017   Frequent falls 10/11/2017   Binge eating disorder    Dependence on continuous supplemental oxygen 05/14/2017   Gout 04/11/2017   Hypomagnesemia    CKD (chronic kidney disease) stage 3, GFR 30-59 ml/min (HCC) 12/15/2016   Carotid artery stenosis    Hypokalemia    Osteoarthritis 10/26/2016   Anoxic brain injury ([place]) 09/08/2016   Overactive bladder 06/07/2016   QT prolongation    OSA and COPD overlap syndrome ([place])    Arthritis    Essential hypertension 03/22/2016   Cocaine use disorder, severe, in sustained remission ([place]) 12/17/2015   History of drug abuse in remission ([place]) 11/28/2015   Hyponatremia 11/25/2015   Chronic diastolic congestive heart failure (HCC)    Chronic respiratory failure with hypoxia ([place]) 06/22/2015   Tobacco use disorder 07/22/2014   COPD (chronic obstructive pulmonary disease) ([place]) 07/08/2014   Seizure ([place]) 01/04/2013   Chronic pain syndrome 06/18/2012   Dyslipidemia 04/24/2011   Anemia 04/24/2011   Diabetic neuropathy ([place]) 04/24/2011   Morbid obesity ([place]) 10/18/2010   Type 2 diabetes mellitus ([place]) 10/18/2010    Past Surgical History:  Procedure Laterality Date   CESAREAN SECTION  1997   HERNIA REPAIR     IR CHOLANGIOGRAM EXISTING TUBE  07/20/2016   IR PERC CHOLECYSTOSTOMY  05/10/2016   IR RADIOLOGIST EVAL & MGMT  06/08/2016   IR RADIOLOGIST EVAL & MGMT  06/29/2016   IR SINUS/FIST TUBE CHK-NON GI  07/12/2016   RIGHT/LEFT HEART CATH AND  CORONARY ANGIOGRAPHY N/A 06/19/2017   Procedure: RIGHT/LEFT HEART CATH AND CORONARY ANGIOGRAPHY;  Surgeon: Jolaine Artist, MD;  Location: [facility];  Service: Cardiovascular;  Laterality: N/A;   TIBIA IM NAIL INSERTION Right 07/12/2016   Procedure: INTRAMEDULLARY (IM) NAIL RIGHT TIBIA;  Surgeon: Leandrew Koyanagi, MD;  Location: [facility];  Service: Orthopedics;  Laterality: Right;   UMBILICAL HERNIA REPAIR  ~ 1963   "that's why I don't have a belly button"   VAGINAL HYSTERECTOMY       OB History     Gravida  4   Para  4   Term  3   Preterm  1   AB  0   Living  3      SAB  0  IAB  0   Ectopic  0   Multiple  0   Live Births  3           Family History  Problem Relation Age of Onset   Cancer Father        prostate   Cancer Mother        lung   Depression Mother    Depression Sister    Anxiety disorder Sister    Schizophrenia Sister    Bipolar disorder Sister    Depression Sister    Depression Brother    Heart failure Other        cousin    Social History   Tobacco Use   Smoking status: Former    Packs/day: 1.50    Years: 38.00    Pack years: 57.00    Types: Cigarettes    Start date: 03/13/1977    Quit date: 04/10/2016    Years since quitting: 4.7   Smokeless tobacco: Never  Vaping Use   Vaping Use: Never used  Substance Use Topics   Alcohol use: No    Alcohol/week: 0.0 standard drinks   Drug use: Not Currently    Types: Cocaine    Comment: 04/10/2016 "last used cocaine back in November 2017"    Home Medications Prior to Admission medications   Medication Sig Start Date End Date Taking? Authorizing Provider  albuterol (PROVENTIL) (2.5 MG/3ML) 0.083% nebulizer solution Take 3 mLs (2.5 mg total) by nebulization every 6 (six) hours as needed for wheezing or shortness of breath. 12/16/20   [name], MD  albuterol (VENTOLIN HFA) 108 (90 Base) MCG/ACT inhaler Inhale 2 puffs into the lungs every 6 (six) hours as needed for wheezing or  shortness of breath. 12/16/20   [name], MD  amLODipine (NORVASC) 5 MG tablet Take 1 tablet (5 mg total) by mouth daily. 12/26/20 01/25/21  Henderly, Britni A, PA-C  asenapine (SAPHRIS) 5 MG SUBL 24 hr tablet Place 2 tablets (10 mg total) under the tongue 2 (two) times daily. 11/17/20   Rankin, Shuvon B, NP  atorvastatin (LIPITOR) 20 MG tablet Take 20 mg by mouth at bedtime. 09/27/20   [provider]  colchicine 0.6 MG tablet Take 2 tabs (1.2 mg) orally at the onset of gout flare, may repeat 1 tab (0.6 mg) 1 hour if symptoms persist 12/26/20   Henderly, Britni A, PA-C  cyclobenzaprine (FLEXERIL) 10 MG tablet Take 1 tablet (10 mg total) by mouth 2 (two) times daily as needed. Patient taking differently: Take 10 mg by mouth 2 (two) times daily as needed for muscle spasms. 12/16/20   [name], MD  diazepam (VALIUM) 5 MG tablet Take 1 tablet (5 mg total) by mouth every 8 (eight) hours as needed for anxiety. Patient taking differently: Take by mouth 3 (three) times daily. 11/15/20 01/14/21  Cobos, Myer Peer, MD  diclofenac (VOLTAREN) 75 MG EC tablet TAKE 1 TABLET(75 MG) BY MOUTH TWICE DAILY AS NEEDED Strength: 75 mg Patient taking differently: Take 75 mg by mouth 2 (two) times daily as needed (for leg pain). 12/28/20   [name], MD  diclofenac Sodium (VOLTAREN) 1 % GEL Apply 4 g topically 4 (four) times daily. 12/28/20   [name], MD  dicyclomine (BENTYL) 10 MG capsule Take 1 capsule (10 mg total) by mouth 3 (three) times daily before meals. 12/24/20   [name], MD  escitalopram (LEXAPRO) 10 MG tablet Take 1 tablet (10 mg total) by mouth daily for 15  days. 12/26/20 01/10/21  Henderly, Britni A, PA-C  febuxostat (ULORIC) 40 MG tablet Take 1 tablet (40 mg total) by mouth daily. Patient taking differently: Take 40 mg by mouth at bedtime. 10/15/20   Lacretia Leigh, MD  Fluticasone-Umeclidin-Vilant (TRELEGY ELLIPTA) 100-62.5-25 MCG/ACT AEPB Inhale 1 puff into the lungs  daily. 12/16/20   [name], MD  furosemide (LASIX) 80 MG tablet Take 1 tablet (80 mg total) by mouth daily. 12/26/20   Henderly, Britni A, PA-C  gabapentin (NEURONTIN) 600 MG tablet Take 1 tablet (600 mg total) by mouth 2 (two) times daily. 10/15/20   Lacretia Leigh, MD  Iron, Ferrous Sulfate, 325 (65 Fe) MG TABS Take 325 mg by mouth daily. 10/08/20   [name], MD  ketoconazole (NIZORAL) 2 % cream Apply 1 application topically daily. Apply to both feet 09/27/20   [provider]  lidocaine (LIDODERM) 5 % Place 1 patch onto the skin daily. Remove & Discard patch within 12 hours or as directed by MD 12/26/20   Henderly, Britni A, PA-C  lisdexamfetamine (VYVANSE) 20 MG capsule Take 20 mg by mouth daily.    [provider]  montelukast (SINGULAIR) 10 MG tablet Take 1 tablet (10 mg total) by mouth at bedtime. 12/16/20   [name], MD  Multiple Vitamin (MULTIVITAMIN WITH MINERALS) TABS tablet Take 1 tablet by mouth daily. 08/16/20   Mercy Riding, MD  omeprazole (PRILOSEC) 40 MG capsule TAKE 1 CAPSULE(40 MG) BY MOUTH DAILY Patient taking differently: Take 40 mg by mouth daily. 10/23/20   [name], MD  oxybutynin (DITROPAN) 5 MG tablet TAKE 1 TABLET(5 MG) BY MOUTH TWICE DAILY Patient taking differently: Take 5 mg by mouth 2 (two) times daily. 10/23/20   [name], MD  potassium chloride SA (KLOR-CON M) 20 MEQ tablet Take 2 tablets (40 mEq total) by mouth 2 (two) times daily. 12/16/20   [name], MD  prazosin (MINIPRESS) 2 MG capsule Take 1 capsule (2 mg total) by mouth at bedtime. 12/16/20   [name], MD  sodium chloride (OCEAN) 0.65 % SOLN nasal spray Place 2 sprays into both nostrils daily.    [provider]  terbinafine (LAMISIL AT) 1 % cream Apply 1 application topically 2 (two) times daily. Patient taking differently: Apply 1 application topically 3 (three) times daily. 09/07/20   [name], MD  thiamine 100 MG tablet Take 1 tablet  (100 mg total) by mouth daily. 02/27/20   Hongalgi, Lenis Dickinson, MD    Allergies    Hydrocodone, Hydroxyzine, Anette Guarneri [lurasidone hcl], Magnesium-containing compounds, Prednisone, Tramadol, Codeine, Trazodone, Topamax [topiramate], Sulfa antibiotics, and Tape  Review of Systems   Review of Systems  Constitutional:  Negative for fever.  HENT:  Negative for ear pain.   Eyes:  Negative for pain.  Respiratory:  Negative for cough.   Cardiovascular:  Positive for chest pain.  Gastrointestinal:  Negative for abdominal pain.  Genitourinary:  Negative for flank pain.  Musculoskeletal:  Negative for back pain.  Skin:  Negative for rash.  Neurological:  Negative for headaches.   Physical Exam Updated Vital Signs BP (!) 144/123    Pulse 81    Temp 98.1 F (36.7 C) (Oral)    Resp 19    Ht 5\' 2"  (1.575 m)    Wt 52.2 kg    SpO2 100%    BMI 21.03 kg/m   Physical Exam Constitutional:      General: [name] not in acute distress.    Appearance:  Normal appearance.  HENT:     Head: Normocephalic.     Nose: Nose normal.  Eyes:     Extraocular Movements: Extraocular movements intact.  Cardiovascular:     Rate and Rhythm: Normal rate.  Pulmonary:     Effort: Pulmonary effort is normal.  Musculoskeletal:        General: Normal range of motion.     Cervical back: Normal range of motion.  Neurological:     General: No focal deficit present.     Mental Status: [name] alert. Mental status is at baseline.    ED Results / Procedures / Treatments   Labs (all labs ordered are listed, but only abnormal results are displayed) Labs Reviewed  CBC WITH DIFFERENTIAL/PLATELET - Abnormal; Notable for the following components:      Result Value   Hemoglobin 11.9 (*)    RDW 15.8 (*)    All other components within normal limits  BASIC METABOLIC PANEL - Abnormal; Notable for the following components:   Potassium 2.8 (*)    CO2 21 (*)    Glucose, Bld 112 (*)    Creatinine, Ser 1.49 (*)    GFR, Estimated 40  (*)    All other components within normal limits  TROPONIN I (HIGH SENSITIVITY) - Abnormal; Notable for the following components:   Troponin I (High Sensitivity) 19 (*)    All other components within normal limits  TROPONIN I (HIGH SENSITIVITY)    EKG EKG Interpretation  Date/Time:  Wednesday January 12 2021 19:22:53 EST Ventricular Rate:  97 PR Interval:  132 QRS Duration: 74 QT Interval:  330 QTC Calculation: 419 R Axis:   32 Text Interpretation: Normal sinus rhythm Nonspecific T wave abnormality Abnormal ECG When compared with ECG of 08-Jan-2021 13:15, PREVIOUS ECG IS PRESENT Confirmed by Thamas Jaegers ([id number]) on 01/12/2021 9:10:59 PM  Radiology DG Chest 1 View  Result Date: 01/12/2021 CLINICAL DATA:  Short of breath and chest pain EXAM: CHEST  1 VIEW COMPARISON:  01/08/2021 FINDINGS: The heart size and mediastinal contours are within normal limits. Both lungs are clear. The visualized skeletal structures are unremarkable. IMPRESSION: No active disease. Electronically Signed   By: Randa Ngo M.D.   On: 01/12/2021 20:09    Procedures Procedures   Medications Ordered in ED Medications  sterile water (preservative free) injection (has no administration in time range)  losartan (COZAAR) tablet 50 mg (has no administration in time range)  ibuprofen (ADVIL) tablet 600 mg (600 mg Oral Given 01/12/21 1921)  amLODipine (NORVASC) tablet 10 mg (10 mg Oral Given 01/12/21 2042)  potassium chloride SA (KLOR-CON M) CR tablet 40 mEq (40 mEq Oral Given 01/12/21 2041)  ziprasidone (GEODON) injection 20 mg (20 mg Intramuscular Given 01/12/21 2127)    ED Course  I have reviewed the triage vital signs and the nursing notes.  Pertinent labs & imaging results that were available during my care of the patient were reviewed by me and considered in my medical decision making (see chart for details).    MDM Rules/Calculators/A&P                         Labs show troponin of 19 which is  above her typical baseline.  Repeat troponin ordered and still pending.  Potassium low at 2.8 as well given oral repletion.  Blood pressure initially elevated but unclear if this is due to her agitation and hyperventilating or actual elevated blood pressure.  After  some observation blood pressure still remains high given medication with improvement in pressure.  Patient still agitated and given Geodon with improvement of agitation.  Repeat troponin pending.  Will be signed out to oncoming provider.     Final Clinical Impression(s) / ED Diagnoses Final diagnoses:  Chest pain, unspecified type    Rx / DC Orders ED Discharge Orders     None        Luna Fuse, MD 01/12/21 2326    Luna Fuse, MD 01/12/21 [phone]

## 2021-01-12 NOTE — Discharge Instructions (Addendum)
Patient will transfer to [name] for evaluation and treatment due to angina and elevated BP.

## 2021-01-12 NOTE — BH Assessment (Signed)
Attempted to see pt to complete full CCA. Prior to being seen, pt was transported via EMS to ED due to chest pain.   Lorah Kalina T. Mare Ferrari, Index, [facility], [facility] Triage Specialist [facility]

## 2021-01-12 NOTE — ED Provider Notes (Signed)
Behavioral Health Urgent Care Medical Screening Exam  Patient Name: Tiffany [name] MRN: [id number] Date of Evaluation: 01/12/21 Chief Complaint:   Diagnosis:  Final diagnoses:  Suicidal intent    History of Present illness: Tiffany [name] is a 60 y.o. female presenting to [facility], accompanied by [employer], with complaints of "I want to kill myself". Patient seen face to face by this provider, consulted with Dr. Lovette Cliche, and charted on 01/12/2021. Upon evaluation, patient noted to be irritable stating I didn't want to come here (speaking to the [facility] officer). Patient stated "I want to leave". Patient stated she was going to walk into traffic as her plan for suicide attempt.  Per patient's medical record, patient has a history of schizoaffective d/o, bipolar type, anxiety, and depression. Patient's vital signs obtained by nursing staff and patient noted with an elevated BP. Per staff, patient experienced angina while in the waiting area. EMS arrived and transported patient to [facility] for angina.   Psychiatric Specialty Exam  Presentation  General Appearance:Appropriate for Environment  Eye Contact:Good  Speech:Clear and Coherent  Speech Volume:Normal  Handedness:Right   Mood and Affect  Mood:Depressed  Affect:Congruent   Thought Process  Thought Processes:Coherent  Descriptions of Associations:Intact  Orientation:Full (Time, Place and Person)  Thought Content:Logical  Diagnosis of Schizophrenia or Schizoaffective disorder in past: Yes  Duration of Psychotic Symptoms: Greater than six months  Hallucinations:None  Ideas of Reference:Delusions  Suicidal Thoughts:Yes, Active With Plan; With Intent Without Intent; Without Plan; Without Means to Carry Out; Without Access to Means  Homicidal Thoughts:No Without Intent; Without Plan; Without Means to Carry Out; Without Access to Means   Sensorium  Memory:Immediate Good; Recent Good; Remote  Good  Judgment:Poor  Insight:Good   Executive Functions  Concentration:Good  Attention Span:Good  [place]  Language:Good   Psychomotor Activity  Psychomotor Activity:Normal   Assets  Assets:Communication Skills; Desire for Improvement; Housing   Sleep  Sleep:Good  Number of hours: 8   No data recorded  Physical Exam: Physical Exam Vitals reviewed.  Skin:    General: Skin is warm and dry.  Neurological:     Mental Status: [name] alert.  Psychiatric:        Attention and Perception: Attention normal.        Mood and Affect: Mood is depressed.        Speech: Speech normal.        Thought Content: Thought content is not paranoid or delusional. Thought content includes suicidal ideation. Thought content does not include homicidal ideation. Thought content includes suicidal plan. Thought content does not include homicidal plan.        Judgment: Judgment is inappropriate.   Review of Systems  Psychiatric/Behavioral:  Positive for depression and suicidal ideas. Negative for substance abuse.   All other systems reviewed and are negative. Blood pressure (!) 213/168, pulse (!) 135, temperature 98.3 F (36.8 C), resp. rate (!) 45, SpO2 100 %. There is no height or weight on file to calculate BMI.  Musculoskeletal: Strength & Muscle Tone: within normal limits Gait & Station: unsteady Patient leans: N/A   [facility] MSE Discharge Disposition for Follow up and Recommendations: Based on my evaluation the patient appears to have an emergency medical condition for which I recommend the patient be transferred to the emergency department for further evaluation.    Franne Grip, NP 01/12/2021, 7:17 PM

## 2021-01-12 NOTE — Progress Notes (Signed)
Patient arrived to the [facility] with the police voluntarily.  Patient states that she has been having suicidal thoughts of walking out into traffic.  She states that she has been feeling hopeless and worthless.  Patient states, "I am bipolar, schizophrenia and I have anxiety." Patient states that she does trust anyone.  Patient states that, "the last time that I was here, you all did not keep me and sent me home." Patient states that she needs some help.  Patient denies HI, Psychosis.  She states that she has not taken her medication in six months.  Patient was being seen and prescribed medications by Dr. Amalia Greenhouse.  Patient has a history of absing benzodiazepines and amphetamines according to her chart, but she denies any history of any drug or alcohol use.  Patient is urgent based on her suicidal ideation, however, she is on a walker and is most likelt too much of a fall risk for the [facility].

## 2021-01-12 NOTE — ED Notes (Addendum)
RN aware of elevated B/P. 

## 2021-01-12 NOTE — Telephone Encounter (Signed)
Call returned to [facility].  She explained that the patient has expressed desire to go to ALF.  Marsh Dolly has not looked into any facilities for the patient yet; but will check availability at facilities and then contact this office for an [facility].   Explained to her that the patient just had a visit with PCP and another visit for completing FL2 is not needed at this time unless PCP feels it is necessary

## 2021-01-12 NOTE — Progress Notes (Signed)
Patient presented as a walk in to [facility] requesting to be seen by a counselor. Upon arrival to the lobby, patient is alone and unaccompanied. Patient declines to complete walk-in assessment form. Patient declines to sign "waiver of medical screening exam" form. Patient reports that she was dropped off by her Mother. This writer attempts to bring patient back to assessment area, upon arrival to this space patient declines to enter the room. She states that she thought that she was going to have an assessment completed. This Probation officer reassures patient that she is, in fact coming to this area to bee assessed by our TTS psych provider and counselor. Patient declined vital signs. Respirations equal and unlabored. Patient insists that she has changed her mind, and that she would prefer to leave.   This Probation officer offers patient another area to sit if a larger space will make her more comfortable. Patient declines. This Probation officer sees patient to the lobby, at which time she states that she is going to call her ride to pick her up, as she no longer wants to stay for an assessment. This Probation officer verbalizes understanding. Lobby receptionist called this Probation officer approximately 10 minutes later to inform that patient has changed her mind, and would like to be seen. Upon arrival to the lobby, patient declines to accompany this Probation officer into the building. Patient is made aware that if seeking an assessment, she would have to come into the building for this. Patient declines and states that she wants to leave.   Lobby receptionist calls x2 additional occasions to notify that patient wants to be seen, both times patient declines to accompany this writer into the assessment area, stating that she has changed her mind.   Lobby receptionist calls again, to notify that patient is on the public lobby phone informing the police that she is being refused care. Upon arrival to the lobby, patient continues to decline to accompany this Probation officer into the  building to complete assessment service. This Probation officer inquires about who dropped her off, at which time she shares that her Mother brought her in. This Probation officer asks if her Mother will be arriving to pick her up, at which time patient states that she will.   Lobby receptionist calls to inform that patient has again changed her mind, and is ready to come back. Upon arrival, patient states that she does not want to be seen, and walks out of the building.  Notified TTS counselor and provider via secure chat. Attempted to notify listed contact person in patient chart Florinda Marker [phone]) without success. Voicemail eludes that this is a business contact ([employer]). No voicemail left at this time. DSS contact listed in prior encounter notes called without answer. Phone continued to ring without ability to leave HIPAA compliant voice message.

## 2021-01-12 NOTE — ED Notes (Signed)
Attempt made to draw second troponin. Patient became angry and ripped the needle from her arm. Patient now refusing blood draw.

## 2021-01-12 NOTE — Telephone Encounter (Signed)
Copied from [place] [id number]. Topic: General - Other >> Jan 12, 2021  9:44 AM Yvette Rack wrote: Reason for CRM: Chauncy Lean with DSS stated patient wants to go in to assistant living and she would like to know if a FL2 form has ever been completed. [place] requests call back at [phone]

## 2021-01-13 ENCOUNTER — Other Ambulatory Visit: Payer: Self-pay | Admitting: Family Medicine

## 2021-01-13 ENCOUNTER — Telehealth: Payer: Self-pay | Admitting: Family Medicine

## 2021-01-13 ENCOUNTER — Encounter (HOSPITAL_COMMUNITY): Payer: Self-pay | Admitting: Internal Medicine

## 2021-01-13 ENCOUNTER — Observation Stay (HOSPITAL_BASED_OUTPATIENT_CLINIC_OR_DEPARTMENT_OTHER): Payer: 59

## 2021-01-13 DIAGNOSIS — R45850 Homicidal ideations: Secondary | ICD-10-CM

## 2021-01-13 DIAGNOSIS — R45851 Suicidal ideations: Secondary | ICD-10-CM | POA: Diagnosis not present

## 2021-01-13 DIAGNOSIS — K219 Gastro-esophageal reflux disease without esophagitis: Secondary | ICD-10-CM | POA: Diagnosis present

## 2021-01-13 DIAGNOSIS — E876 Hypokalemia: Secondary | ICD-10-CM

## 2021-01-13 DIAGNOSIS — R079 Chest pain, unspecified: Secondary | ICD-10-CM

## 2021-01-13 DIAGNOSIS — R0789 Other chest pain: Secondary | ICD-10-CM | POA: Diagnosis present

## 2021-01-13 DIAGNOSIS — M109 Gout, unspecified: Secondary | ICD-10-CM

## 2021-01-13 DIAGNOSIS — E785 Hyperlipidemia, unspecified: Secondary | ICD-10-CM | POA: Diagnosis not present

## 2021-01-13 DIAGNOSIS — J42 Unspecified chronic bronchitis: Secondary | ICD-10-CM

## 2021-01-13 LAB — COMPREHENSIVE METABOLIC PANEL
ALT: 20 U/L (ref 0–44)
AST: 23 U/L (ref 15–41)
Albumin: 3.7 g/dL (ref 3.5–5.0)
Alkaline Phosphatase: 141 U/L — ABNORMAL HIGH (ref 38–126)
Anion gap: 11 (ref 5–15)
BUN: 18 mg/dL (ref 8–23)
CO2: 20 mmol/L — ABNORMAL LOW (ref 22–32)
Calcium: 8.9 mg/dL (ref 8.9–10.3)
Chloride: 104 mmol/L (ref 98–111)
Creatinine, Ser: 1.44 mg/dL — ABNORMAL HIGH (ref 0.44–1.00)
GFR, Estimated: 41 mL/min — ABNORMAL LOW (ref >60)
Glucose, Bld: 90 mg/dL (ref 70–99)
Potassium: 3.4 mmol/L — ABNORMAL LOW (ref 3.5–5.1)
Sodium: 135 mmol/L (ref 135–145)
Total Bilirubin: 0.6 mg/dL (ref 0.3–1.2)
Total Protein: 6.4 g/dL — ABNORMAL LOW (ref 6.5–8.1)

## 2021-01-13 LAB — MAGNESIUM: Magnesium: 1.7 mg/dL (ref 1.7–2.4)

## 2021-01-13 LAB — TROPONIN I (HIGH SENSITIVITY)
Troponin I (High Sensitivity): 28 ng/L — ABNORMAL HIGH (ref <18)
Troponin I (High Sensitivity): 27 ng/L — ABNORMAL HIGH (ref <18)

## 2021-01-13 LAB — ECHOCARDIOGRAM COMPLETE
Area-P 1/2: 3.34 cm2
Height: 62 in
S' Lateral: 3.2 cm
Weight: 1840 oz

## 2021-01-13 LAB — RESP PANEL BY RT-PCR (FLU A&B, COVID) ARPGX2
Influenza A by PCR: NEGATIVE
Influenza B by PCR: NEGATIVE
SARS Coronavirus 2 by RT PCR: NEGATIVE

## 2021-01-13 LAB — CBC
HCT: 31.9 % — ABNORMAL LOW (ref 36.0–46.0)
Hemoglobin: 10.3 g/dL — ABNORMAL LOW (ref 12.0–15.0)
MCH: 28.6 pg (ref 26.0–34.0)
MCHC: 32.3 g/dL (ref 30.0–36.0)
MCV: 88.6 fL (ref 80.0–100.0)
Platelets: 380 10*3/uL (ref 150–400)
RBC: 3.6 MIL/uL — ABNORMAL LOW (ref 3.87–5.11)
RDW: 15.9 % — ABNORMAL HIGH (ref 11.5–15.5)
WBC: 6.3 10*3/uL (ref 4.0–10.5)
nRBC: 0 % (ref 0.0–0.2)

## 2021-01-13 LAB — HIV ANTIBODY (ROUTINE TESTING W REFLEX): HIV Screen 4th Generation wRfx: NONREACTIVE

## 2021-01-13 LAB — LIPASE, BLOOD: Lipase: 35 U/L (ref 11–51)

## 2021-01-13 MED ORDER — HALOPERIDOL LACTATE 5 MG/ML IJ SOLN: 2.0000 mg | Freq: Four times a day (QID) | INTRAMUSCULAR | Status: DC | PRN

## 2021-01-13 MED ORDER — POTASSIUM CHLORIDE CRYS ER 20 MEQ PO TBCR
40.0000 meq | EXTENDED_RELEASE_TABLET | Freq: Once | ORAL | Status: AC
  Administered 2021-01-13: 09:00:00 40 meq via ORAL
  Filled 2021-01-13: qty 2

## 2021-01-13 MED ORDER — ESCITALOPRAM OXALATE 10 MG PO TABS
10.0000 mg | ORAL_TABLET | Freq: Every day | ORAL | Status: DC
  Administered 2021-01-13 – 2021-01-14 (×2): 10 mg via ORAL
  Filled 2021-01-13 (×2): qty 1

## 2021-01-13 MED ORDER — FLUTICASONE FUROATE-VILANTEROL 100-25 MCG/ACT IN AEPB
1.0000 | INHALATION_SPRAY | Freq: Every day | RESPIRATORY_TRACT | Status: DC
  Administered 2021-01-14: 08:00:00 1 via RESPIRATORY_TRACT
  Filled 2021-01-13: qty 28

## 2021-01-13 MED ORDER — PANTOPRAZOLE SODIUM 40 MG PO TBEC
40.0000 mg | DELAYED_RELEASE_TABLET | Freq: Every day | ORAL | Status: DC
  Administered 2021-01-13 – 2021-01-14 (×2): 40 mg via ORAL
  Filled 2021-01-13 (×2): qty 1

## 2021-01-13 MED ORDER — OXYBUTYNIN CHLORIDE 5 MG PO TABS
5.0000 mg | ORAL_TABLET | Freq: Two times a day (BID) | ORAL | Status: DC
  Administered 2021-01-13 – 2021-01-14 (×3): 5 mg via ORAL
  Filled 2021-01-13 (×3): qty 1

## 2021-01-13 MED ORDER — FLUTICASONE-UMECLIDIN-VILANT 100-62.5-25 MCG/ACT IN AEPB: 1.0000 | INHALATION_SPRAY | Freq: Every day | RESPIRATORY_TRACT | Status: DC

## 2021-01-13 MED ORDER — POTASSIUM CHLORIDE CRYS ER 20 MEQ PO TBCR
20.0000 meq | EXTENDED_RELEASE_TABLET | Freq: Once | ORAL | Status: AC
  Administered 2021-01-13: 03:00:00 20 meq via ORAL
  Filled 2021-01-13: qty 1

## 2021-01-13 MED ORDER — POTASSIUM CHLORIDE 10 MEQ/100ML IV SOLN: 10.0000 meq | INTRAVENOUS | Status: DC

## 2021-01-13 MED ORDER — ACETAMINOPHEN 325 MG PO TABS
650.0000 mg | ORAL_TABLET | Freq: Four times a day (QID) | ORAL | Status: DC | PRN
  Administered 2021-01-13: 22:00:00 650 mg via ORAL
  Filled 2021-01-13: qty 2

## 2021-01-13 MED ORDER — AMLODIPINE BESYLATE 5 MG PO TABS: 5.0000 mg | ORAL_TABLET | Freq: Every day | ORAL | Status: DC

## 2021-01-13 MED ORDER — ACETAMINOPHEN 650 MG RE SUPP: 650.0000 mg | Freq: Four times a day (QID) | RECTAL | Status: DC | PRN

## 2021-01-13 MED ORDER — ASENAPINE MALEATE 5 MG SL SUBL
10.0000 mg | SUBLINGUAL_TABLET | Freq: Two times a day (BID) | SUBLINGUAL | Status: DC
Start: 2021-01-13 — End: 2021-01-14
  Administered 2021-01-13 – 2021-01-14 (×2): 10 mg via SUBLINGUAL
  Filled 2021-01-13 (×5): qty 2

## 2021-01-13 MED ORDER — THIAMINE HCL 100 MG PO TABS
100.0000 mg | ORAL_TABLET | Freq: Every day | ORAL | Status: DC
  Administered 2021-01-14: 11:00:00 100 mg via ORAL
  Filled 2021-01-13 (×2): qty 1

## 2021-01-13 MED ORDER — FERROUS SULFATE 325 (65 FE) MG PO TABS
325.0000 mg | ORAL_TABLET | Freq: Every day | ORAL | Status: DC
  Administered 2021-01-13 – 2021-01-14 (×2): 325 mg via ORAL
  Filled 2021-01-13 (×2): qty 1

## 2021-01-13 MED ORDER — AMLODIPINE BESYLATE 10 MG PO TABS
10.0000 mg | ORAL_TABLET | Freq: Every day | ORAL | Status: DC
  Administered 2021-01-13 – 2021-01-14 (×2): 10 mg via ORAL
  Filled 2021-01-13: qty 1
  Filled 2021-01-13: qty 2

## 2021-01-13 MED ORDER — GABAPENTIN 300 MG PO CAPS
600.0000 mg | ORAL_CAPSULE | Freq: Two times a day (BID) | ORAL | Status: DC
  Administered 2021-01-13 – 2021-01-14 (×3): 600 mg via ORAL
  Filled 2021-01-13 (×3): qty 2

## 2021-01-13 MED ORDER — MONTELUKAST SODIUM 10 MG PO TABS
10.0000 mg | ORAL_TABLET | Freq: Every day | ORAL | Status: DC
  Administered 2021-01-13: 21:00:00 10 mg via ORAL
  Filled 2021-01-13: qty 1

## 2021-01-13 MED ORDER — FEBUXOSTAT 40 MG PO TABS
40.0000 mg | ORAL_TABLET | Freq: Every day | ORAL | Status: DC
  Administered 2021-01-13 – 2021-01-14 (×2): 40 mg via ORAL
  Filled 2021-01-13 (×2): qty 1

## 2021-01-13 MED ORDER — LORAZEPAM 2 MG/ML IJ SOLN
1.0000 mg | Freq: Four times a day (QID) | INTRAMUSCULAR | Status: DC | PRN
  Administered 2021-01-13: 10:00:00 1 mg via INTRAVENOUS
  Filled 2021-01-13: qty 1

## 2021-01-13 MED ORDER — COLCHICINE 0.6 MG PO TABS: ORAL_TABLET | ORAL | 0 refills | Status: DC

## 2021-01-13 MED ORDER — ADULT MULTIVITAMIN W/MINERALS CH
1.0000 | ORAL_TABLET | Freq: Every day | ORAL | Status: DC
  Administered 2021-01-13 – 2021-01-14 (×2): 1 via ORAL
  Filled 2021-01-13 (×2): qty 1

## 2021-01-13 MED ORDER — COLCHICINE 0.6 MG PO TABS
0.6000 mg | ORAL_TABLET | Freq: Every day | ORAL | Status: DC
  Administered 2021-01-13 – 2021-01-14 (×2): 0.6 mg via ORAL
  Filled 2021-01-13 (×2): qty 1

## 2021-01-13 MED ORDER — ATORVASTATIN CALCIUM 20 MG PO TABS
20.0000 mg | ORAL_TABLET | Freq: Every day | ORAL | Status: DC
  Administered 2021-01-13 – 2021-01-14 (×2): 20 mg via ORAL
  Filled 2021-01-13: qty 1
  Filled 2021-01-13: qty 2

## 2021-01-13 MED ORDER — ALBUTEROL SULFATE (2.5 MG/3ML) 0.083% IN NEBU: 2.5000 mg | INHALATION_SOLUTION | Freq: Four times a day (QID) | RESPIRATORY_TRACT | Status: DC | PRN

## 2021-01-13 NOTE — BH Assessment (Addendum)
[facility] Assessment Progress Note   This writer has been included in conversation about this pt's disposition.  It appears that she presented at [facility] yesterday (01/12/2021), where she was seen by Berniece Andreas, MD, after which she was sent to [facility].  Pt was under consideration for medical admission at one time, but now seems to be medically cleared and a TOC consult has been ordered to address pt's psychosocial needs.  Notes suggest that pt was under IVC at some point, however, this Probation officer made calls to [place], and [employer], and there is no indication that an IVC was ever initiated; she is a voluntary patient.  Pt reportedly will need psychiatry in the community, but she reportedly has an appointment scheduled at [facility], where she can receive both psychiatry and therapy.  I have included recommendation to follow through with this appointment in pt's AVS.  This has been communicated to [place], to Hospitalist Wendee Beavers, MD and to Dr [name], as well as pt's nurses, Caryl Pina and [place].    Jalene Mullet, [place] Coordinator [phone]

## 2021-01-13 NOTE — Telephone Encounter (Signed)
Requested Prescriptions  Pending Prescriptions Disp Refills   colchicine 0.6 MG tablet 15 tablet 0    Sig: Take 2 tabs (1.2 mg) orally at the onset of gout flare, may repeat 1 tab (0.6 mg) 1 hour if symptoms persist     Endocrinology:  Gout Agents Failed - 01/13/2021  1:26 PM      Failed - Cr in normal range and within 360 days    Creat  Date Value Ref Range Status  02/18/2016 1.22 (H) 0.50 - 1.05 mg/dL Final    Comment:      For patients > or = 61 years of age: The upper reference limit for Creatinine is approximately 13% higher for people identified as African-American.      Creatinine, Ser  Date Value Ref Range Status  01/13/2021 1.44 (H) 0.44 - 1.00 mg/dL Final   Creatinine,U  Date Value Ref Range Status  06/17/2012 521.94 mg/dL Final    Comment:    Result confirmed by automatic dilution.    Cutoff Values for Urine Drug Screen, Pain Mgmt          Drug Class           Cutoff (ng/mL)          Amphetamines             500          Barbiturates             200          Cocaine Metabolites      150          Benzodiazepines          200          Methadone                300          Opiates                  300          Phencyclidine             25          Propoxyphene             300          Marijuana Metabolites     50    For medical purposes only.   Creatinine, Urine  Date Value Ref Range Status  02/19/2020 300.84 mg/dL Final    Comment:    Performed at [facility], [place] [address], [place], [place] [address]         Passed - Uric Acid in normal range and within 360 days    Uric Acid, Serum  Date Value Ref Range Status  02/18/2020 3.0 2.5 - 7.1 mg/dL Final    Comment:    Performed at [facility], [place] [address], [place], [place] [address]         Passed - Valid encounter within last 12 months    Recent Outpatient Visits          1 week ago Pain in both lower extremities   [place], [place], MD   1 month ago Adnexal cyst   [place], Enobong, MD   1 month ago Neuropathy   [place], [place]   3 months ago Diarrhea, unspecified  type   [place], Enobong, MD   4 months ago Tinea pedis of both feet   [place], Enobong, MD      Future Appointments            In 3 weeks Gilford Rile, Martie Lee, NP [facility]

## 2021-01-13 NOTE — Progress Notes (Addendum)
PROGRESS NOTE  Tiffany [name] WNU:[id number] DOB: 1960/12/17   PCP: [name], MD  Patient is from: Home.  DOA: 01/12/2021 LOS: 0  Chief complaints:  Chief Complaint  Patient presents with   Shortness of Breath   Chest Pain     Brief Narrative / Interim history: 60 year old F with PMH of diastolic CHF, IHK-7Q, HTN, HLD, COPD, GERD, anxiety, depression, PTSD, BPD and paranoid schizophrenia brought to [facility] by GPD with concern for suicidal attempt, and transferred to ED due to chest pain per recommendation by psychiatry.  She was previously seen in ED and evaluated by psychiatry multiple times.  She was admitted for chest pain work-up although her chest discomfort resolved after a dose of Geodon 20 mg IM x1.  EKG without acute ischemic finding.  Troponin slightly elevated to 19 and trended up to 28 but remained stable at 27.    Patient has not had further cardiopulmonary symptoms.  No significant finding on TTE.  She had mild hypokalemia that has been replenished.  [name] medically cleared for inpatient psychiatric hospitalization if still indicated. Patient had an episode of agitation and anxiety the morning of 01/13/2021 requiring as needed Ativan and Haldol injection.  Her agitation resolved.  Psychiatry was consulted and recommended resuming home safaris and Lexapro and TOC consulted for placement to SNF for ALF.  Per psychiatry, no indication for inpatient psych hospitalization.   Patient is medically stable for transfer to inpatient psychiatric unit if still indicated.   Subjective: Seen and examined earlier this morning.  Patient was standing in the room when I walked in.  She was not cooperative.  She took of the blood pressure cuff and decided to walk out of the room.  She was brought back to the room with the help of ED staffs.  She became calm after IV Ativan and IV Haldol.  She denies chest pain, dyspnea, GI or UTI symptoms.  She reports bilateral feet pain that she  attributes to gout.  She likes to resume care psych medications.   Objective: Vitals:   01/13/21 0800 01/13/21 0900 01/13/21 1050 01/13/21 1054  BP: (!) 147/107 (!) 155/105 (!) 125/96   Pulse: 90 87 (!) 129   Resp:  15  16  Temp:    98.2 F (36.8 C)  TempSrc:    Oral  SpO2: 100% 100% 92%   Weight:      Height:        Examination:  GENERAL: No apparent distress.  Nontoxic. HEENT: MMM.  Vision and hearing grossly intact.  NECK: Supple.  No apparent JVD.  RESP:  No IWOB.  Fair aeration bilaterally. CVS:  RRR. Heart sounds normal.  ABD/GI/GU: BS+. Abd soft, NTND.  MSK/EXT:  Moves extremities. No apparent deformity. No edema.  SKIN: no apparent skin lesion or wound NEURO: Awake, alert and oriented fairly.  No apparent focal neuro deficit. PSYCH: Calm.  Was agitated but calm after IV Ativan and Haldol.  Procedures:  None  Microbiology summarized: [id number] and influenza PCR nonreactive.  Assessment & Plan: Atypical chest pain: Likely panic attack.  ACS work-up unrevealing.  TTE reassuring.  Chest pain resolved while in ED prior to admission. -Manage anxiety and other underlying psych issues as below.  Anxiety/depression/PTSD/paranoid schizophrenia/agitation/suicidal ideation -Appreciate recommendation by psych  -No indication for inpatient psych hospitalization.  -Restart home safaris or Lexapro  -TOC consulted for placement either SNF or ALF -Per psychiatry, who checked with [facility], patient is not under IVC. -Continue as  needed Ativan -TOC consulted for resources and for possible ALF/SNF placement   Chronic COPD: Stable. -Breo Ellipta.  Essential hypertension: BP slightly elevated. -Increase home amlodipine to 10 mg daily -Continue holding Lasix  Chronic diastolic CHF: Stable.  Appears euvolemic on exam.  TTE reassuring. -Continue holding Lasix  Normocytic anemia: H&H stable. Recent Labs    08/14/20 0746 08/15/20 0804 08/16/20 1255  11/16/20 0246 12/16/20 1326 12/26/20 0549 01/05/21 2129 01/08/21 1311 01/12/21 1827 01/13/21 0238  HGB 8.3* 8.7* 8.7* 9.0* 10.3* 10.8* 10.3* 11.6* 11.9* 10.3*  -Monitor as needed   Mildly elevated troponin: Likely demand ischemia.  ACS work-up negative. -No further cardiac work-up indicated  Hypokalemia: K2.8 >>3.4. -P.o. KCl 40x1 -Recheck   Bilateral feet pain: Has history of neuropathy and gout.  No signs of gout flareup on exam -Resume home gabapentin, Uloric and colchicine.  GERD -Continue PPI.   Addendum-in red as above.  Body mass index is 21.03 kg/m.        SCDs Start: 01/13/21 0124  Code Status: Full code Family Communication: Patient and/or RN. Available if any question.  Level of care: Telemetry Status is: Observation  The patient remains OBS appropriate and will d/c before 2 midnights.      Consultants:  Psychiatry   Sch Meds:  Scheduled Meds:  amLODipine  10 mg Oral Daily   atorvastatin  20 mg Oral Daily   betamethasone acetate-betamethasone sodium phosphate  6 mg Other Once   febuxostat  40 mg Oral Daily   ferrous sulfate  325 mg Oral Q breakfast   gabapentin  600 mg Oral BID   montelukast  10 mg Oral QHS   multivitamin with minerals  1 tablet Oral Daily   oxybutynin  5 mg Oral BID   pantoprazole  40 mg Oral Daily   thiamine  100 mg Oral Daily   Continuous Infusions: PRN Meds:.acetaminophen **OR** acetaminophen, albuterol, haloperidol lactate, LORazepam  Antimicrobials: Anti-infectives (From admission, onward)    None        I have personally reviewed the following labs and images: CBC: Recent Labs  Lab 01/08/21 1311 01/12/21 1827 01/13/21 0238  WBC 6.5 6.8 6.3  NEUTROABS  --  4.5  --   HGB 11.6* 11.9* 10.3*  HCT 37.4 37.8 31.9*  MCV 91.9 89.6 88.6  PLT 329 347 380   BMP &GFR Recent Labs  Lab 01/08/21 1311 01/12/21 1827 01/13/21 0238  NA 141 136 135  K 3.5 2.8* 3.4*  CL 108 104 104  CO2 23 21* 20*   GLUCOSE 77 112* 90  BUN 13 15 18   CREATININE 1.45* 1.49* 1.44*  CALCIUM 9.8 9.5 8.9  MG  --   --  1.7   Estimated Creatinine Clearance: 32.4 mL/min (A) (by C-G formula based on SCr of 1.44 mg/dL (H)). Liver & Pancreas: Recent Labs  Lab 01/13/21 0238  AST 23  ALT 20  ALKPHOS 141*  BILITOT 0.6  PROT 6.4*  ALBUMIN 3.7   Recent Labs  Lab 01/13/21 0455  LIPASE 35   No results for input(s): AMMONIA in the last 168 hours. Diabetic: No results for input(s): HGBA1C in the last 72 hours. No results for input(s): GLUCAP in the last 168 hours. Cardiac Enzymes: No results for input(s): CKTOTAL, CKMB, CKMBINDEX, TROPONINI in the last 168 hours. Recent Labs    06/04/20 1140 06/15/20 1636 07/13/20 1310  PROBNP 11,232* 2,313* 201   Coagulation Profile: No results for input(s): INR, PROTIME in the last 168 hours.  Thyroid Function Tests: No results for input(s): TSH, T4TOTAL, FREET4, T3FREE, THYROIDAB in the last 72 hours. Lipid Profile: No results for input(s): CHOL, HDL, LDLCALC, TRIG, CHOLHDL, LDLDIRECT in the last 72 hours. Anemia Panel: No results for input(s): VITAMINB12, FOLATE, FERRITIN, TIBC, IRON, RETICCTPCT in the last 72 hours. Urine analysis:    Component Value Date/Time   COLORURINE STRAW (A) 11/16/2020 1549   APPEARANCEUR CLEAR 11/16/2020 1549   LABSPEC 1.005 11/16/2020 1549   PHURINE 6.0 11/16/2020 1549   GLUCOSEU NEGATIVE 11/16/2020 1549   HGBUR MODERATE (A) 11/16/2020 1549   BILIRUBINUR NEGATIVE 11/16/2020 1549   BILIRUBINUR NEG 05/21/2014 1155   KETONESUR NEGATIVE 11/16/2020 1549   PROTEINUR NEGATIVE 11/16/2020 1549   UROBILINOGEN 0.2 09/30/2019 1557   NITRITE NEGATIVE 11/16/2020 1549   LEUKOCYTESUR TRACE (A) 11/16/2020 1549   Sepsis Labs: Invalid input(s): PROCALCITONIN, [place]  Microbiology: Recent Results (from the past 240 hour(s))  Resp Panel by RT-PCR (Flu A&B, Covid) Nasopharyngeal Swab     Status: None   Collection Time: 01/05/21  9:29  PM   Specimen: Nasopharyngeal Swab; Nasopharyngeal(NP) swabs in vial transport medium  Result Value Ref Range Status   SARS Coronavirus 2 by RT PCR NEGATIVE NEGATIVE Final    Comment: (NOTE) SARS-CoV-2 target nucleic acids are NOT DETECTED.  The SARS-CoV-2 RNA is generally detectable in upper respiratory specimens during the acute phase of infection. The lowest concentration of SARS-CoV-2 viral copies this assay can detect is 138 copies/mL. A negative result does not preclude SARS-Cov-2 infection and should not be used as the sole basis for treatment or other patient management decisions. A negative result may occur with  improper specimen collection/handling, submission of specimen other than nasopharyngeal swab, presence of viral mutation(s) within the areas targeted by this assay, and inadequate number of viral copies(<138 copies/mL). A negative result must be combined with clinical observations, patient history, and epidemiological information. The expected result is Negative.  Fact Sheet for Patients:  EntrepreneurPulse.com.au  Fact Sheet for Healthcare Providers:  IncredibleEmployment.be  This test is no t yet approved or cleared by the [place] FDA and  has been authorized for detection and/or diagnosis of SARS-CoV-2 by FDA under an Emergency Use Authorization (EUA). This EUA will remain  in effect (meaning this test can be used) for the duration of the COVID-19 declaration under Section 564(b)(1) of the Act, 21 U.S.C.section 360bbb-3(b)(1), unless the authorization is terminated  or revoked sooner.       Influenza A by PCR NEGATIVE NEGATIVE Final   Influenza B by PCR NEGATIVE NEGATIVE Final    Comment: (NOTE) The Xpert Xpress SARS-CoV-2/FLU/RSV plus assay is intended as an aid in the diagnosis of influenza from Nasopharyngeal swab specimens and should not be used as a sole basis for treatment. Nasal washings and aspirates are  unacceptable for Xpert Xpress SARS-CoV-2/FLU/RSV testing.  Fact Sheet for Patients: EntrepreneurPulse.com.au  Fact Sheet for Healthcare Providers: IncredibleEmployment.be  This test is not yet approved or cleared by the [place] FDA and has been authorized for detection and/or diagnosis of SARS-CoV-2 by FDA under an Emergency Use Authorization (EUA). This EUA will remain in effect (meaning this test can be used) for the duration of the COVID-19 declaration under Section 564(b)(1) of the Act, 21 U.S.C. section 360bbb-3(b)(1), unless the authorization is terminated or revoked.  Performed at [facility], [place] [address], [place], [place] [address]   Resp Panel by RT-PCR (Flu A&B, Covid) Nasopharyngeal Swab     Status: None  Collection Time: 01/13/21  1:04 AM   Specimen: Nasopharyngeal Swab; Nasopharyngeal(NP) swabs in vial transport medium  Result Value Ref Range Status   SARS Coronavirus 2 by RT PCR NEGATIVE NEGATIVE Final    Comment: (NOTE) SARS-CoV-2 target nucleic acids are NOT DETECTED.  The SARS-CoV-2 RNA is generally detectable in upper respiratory specimens during the acute phase of infection. The lowest concentration of SARS-CoV-2 viral copies this assay can detect is 138 copies/mL. A negative result does not preclude SARS-Cov-2 infection and should not be used as the sole basis for treatment or other patient management decisions. A negative result may occur with  improper specimen collection/handling, submission of specimen other than nasopharyngeal swab, presence of viral mutation(s) within the areas targeted by this assay, and inadequate number of viral copies(<138 copies/mL). A negative result must be combined with clinical observations, patient history, and epidemiological information. The expected result is Negative.  Fact Sheet for Patients:  EntrepreneurPulse.com.au  Fact Sheet for  Healthcare Providers:  IncredibleEmployment.be  This test is no t yet approved or cleared by the [place] FDA and  has been authorized for detection and/or diagnosis of SARS-CoV-2 by FDA under an Emergency Use Authorization (EUA). This EUA will remain  in effect (meaning this test can be used) for the duration of the COVID-19 declaration under Section 564(b)(1) of the Act, 21 U.S.C.section 360bbb-3(b)(1), unless the authorization is terminated  or revoked sooner.       Influenza A by PCR NEGATIVE NEGATIVE Final   Influenza B by PCR NEGATIVE NEGATIVE Final    Comment: (NOTE) The Xpert Xpress SARS-CoV-2/FLU/RSV plus assay is intended as an aid in the diagnosis of influenza from Nasopharyngeal swab specimens and should not be used as a sole basis for treatment. Nasal washings and aspirates are unacceptable for Xpert Xpress SARS-CoV-2/FLU/RSV testing.  Fact Sheet for Patients: EntrepreneurPulse.com.au  Fact Sheet for Healthcare Providers: IncredibleEmployment.be  This test is not yet approved or cleared by the [place] FDA and has been authorized for detection and/or diagnosis of SARS-CoV-2 by FDA under an Emergency Use Authorization (EUA). This EUA will remain in effect (meaning this test can be used) for the duration of the COVID-19 declaration under Section 564(b)(1) of the Act, 21 U.S.C. section 360bbb-3(b)(1), unless the authorization is terminated or revoked.  Performed at [facility], [place] [address] [place],  [address]     Radiology Studies: DG Chest 1 View  Result Date: 01/12/2021 CLINICAL DATA:  Short of breath and chest pain EXAM: CHEST  1 VIEW COMPARISON:  01/08/2021 FINDINGS: The heart size and mediastinal contours are within normal limits. Both lungs are clear. The visualized skeletal structures are unremarkable. IMPRESSION: No active disease. Electronically Signed   By:  Randa Ngo M.D.   On: 01/12/2021 20:09   ECHOCARDIOGRAM COMPLETE  Result Date: 01/13/2021    ECHOCARDIOGRAM REPORT   Patient Name:   Tiffany [name] [facility] Date of Exam: 01/13/2021 Medical Rec #:  [id number]       Height:       62.0 in Accession #:    [id number]      Weight:       115.0 lb Date of Birth:  08-30-1960       BSA:          1.511 m Patient Age:    65 years        BP:           147/107 mmHg Patient Gender: F  HR:           86 bpm. Exam Location:  Inpatient Procedure: 2D Echo, Color Doppler and Cardiac Doppler Indications:    R07.9* Chest pain, unspecified  History:        Patient has prior history of Echocardiogram examinations, most                 recent 05/13/2020. COPD; Risk Factors:Hypertension, Dyslipidemia                 and Diabetes.  Sonographer:    Raquel Sarna Senior RDCS Referring Phys: [id number] Rhetta Mura  Sonographer Comments: Apical window very tender, performed with minimal probe pressure. IMPRESSIONS  1. Left ventricular ejection fraction, by estimation, is 60 to 65%. The left ventricle has normal function. The left ventricle has no regional wall motion abnormalities. Left ventricular diastolic parameters are consistent with Grade I diastolic dysfunction (impaired relaxation).  2. Right ventricular systolic function is normal. The right ventricular size is normal. There is normal pulmonary artery systolic pressure.  3. Left atrial size was mildly dilated.  4. The mitral valve is normal in structure. No evidence of mitral valve regurgitation. No evidence of mitral stenosis.  5. The aortic valve is tricuspid. Aortic valve regurgitation is not visualized. No aortic stenosis is present.  6. The inferior vena cava is normal in size with greater than 50% respiratory variability, suggesting right atrial pressure of 3 mmHg. FINDINGS  Left Ventricle: Left ventricular ejection fraction, by estimation, is 60 to 65%. The left ventricle has normal function. The left ventricle has no  regional wall motion abnormalities. The left ventricular internal cavity size was normal in size. There is  no left ventricular hypertrophy. Left ventricular diastolic parameters are consistent with Grade I diastolic dysfunction (impaired relaxation). Normal left ventricular filling pressure. Right Ventricle: The right ventricular size is normal. No increase in right ventricular wall thickness. Right ventricular systolic function is normal. There is normal pulmonary artery systolic pressure. The tricuspid regurgitant velocity is 1.05 m/s, and  with an assumed right atrial pressure of 3 mmHg, the estimated right ventricular systolic pressure is 7.4 mmHg. Left Atrium: Left atrial size was mildly dilated. Right Atrium: Right atrial size was normal in size. Pericardium: There is no evidence of pericardial effusion. Mitral Valve: The mitral valve is normal in structure. No evidence of mitral valve regurgitation. No evidence of mitral valve stenosis. Tricuspid Valve: The tricuspid valve is normal in structure. Tricuspid valve regurgitation is trivial. No evidence of tricuspid stenosis. Aortic Valve: The aortic valve is tricuspid. Aortic valve regurgitation is not visualized. No aortic stenosis is present. Pulmonic Valve: The pulmonic valve was normal in structure. Pulmonic valve regurgitation is not visualized. No evidence of pulmonic stenosis. Aorta: The aortic root is normal in size and structure. Venous: The inferior vena cava is normal in size with greater than 50% respiratory variability, suggesting right atrial pressure of 3 mmHg. IAS/Shunts: No atrial level shunt detected by color flow Doppler.  LEFT VENTRICLE PLAX 2D LVIDd:         4.30 cm   Diastology LVIDs:         3.20 cm   LV e' medial:    5.55 cm/s LV PW:         1.20 cm   LV E/e' medial:  7.9 LV IVS:        1.10 cm   LV e' lateral:   6.09 cm/s LVOT diam:     2.00 cm  LV E/e' lateral: 7.2 LV SV:         57 LV SV Index:   38 LVOT Area:     3.14 cm  RIGHT  VENTRICLE RV S prime:     14.10 cm/s TAPSE (M-mode): 2.2 cm LEFT ATRIUM             Index        RIGHT ATRIUM           Index LA diam:        3.60 cm 2.38 cm/m   RA Area:     16.50 cm LA Vol (A2C):   52.1 ml 34.48 ml/m  RA Volume:   43.30 ml  28.66 ml/m LA Vol (A4C):   32.4 ml 21.44 ml/m LA Biplane Vol: 41.9 ml 27.73 ml/m  AORTIC VALVE LVOT Vmax:   90.50 cm/s LVOT Vmean:  66.100 cm/s LVOT VTI:    0.181 m  AORTA Ao Root diam: 2.90 cm Ao Asc diam:  3.30 cm MITRAL VALVE               TRICUSPID VALVE MV Area (PHT): 3.34 cm    TR Peak grad:   4.4 mmHg MV Decel Time: 227 msec    TR Vmax:        105.00 cm/s MV E velocity: 44.10 cm/s MV A velocity: 93.60 cm/s  SHUNTS MV E/A ratio:  0.47        Systemic VTI:  0.18 m                            Systemic Diam: 2.00 cm Skeet Latch MD Electronically signed by Skeet Latch MD Signature Date/Time: 01/13/2021/12:21:13 PM    Final        Jeyda Siebel T. [place]  If 7PM-7AM, please contact night-coverage [email or web address] 01/13/2021, 1:43 PM

## 2021-01-13 NOTE — Telephone Encounter (Signed)
noted 

## 2021-01-13 NOTE — ED Notes (Signed)
Sitter at bedside.

## 2021-01-13 NOTE — H&P (Signed)
History and Physical    PLEASE NOTE THAT DRAGON DICTATION SOFTWARE WAS USED IN THE CONSTRUCTION OF THIS NOTE.   Tiffany [name] AOZ:[id number] DOB: 09-22-1960 DOA: 01/12/2021  PCP: Tiffany Rakes, MD  Patient coming from: home   I have personally briefly reviewed patient's old medical records in [place]  Chief Complaint: Chest pain  HPI: Tiffany [name] is a 60 y.o. female with medical history significant for chronic diastolic heart failure, CKD 3A with baseline creatinine approximately 1.4, bipolar disorder, generalized anxiety disorder, COPD, hypertension, hyperlipidemia, GERD, who is admitted to [facility] on 01/12/2021 from behavioral health unit for further evaluation and management of chest pain.   The patient had originally presented to the emergency department on 01/08/2021 complaining of suicidal ideations at that time, she was evaluated by psychiatry, was involuntarily committed on the basis of her suicidal ideations.  In the interval, she has been holding in the behavioral health unit awaiting placement to inpatient psych unit.  She was told that transfer to inpatient psych would occurred this evening, and shortly after the delivery of this update to the patient, she developed acute onset of substernal chest pressure without radiation, associated with shortness of breath.  Patient reports that this was associated with significant anxiety, with resolution of her anxiety as well as her chest discomfort following dose of Geodon 20 mg IM x1.  She denies any subsequent/residual chest pain/shortness of breath.  Denies any associated diaphoresis, presyncope, syncope, nausea/vomiting.  The patient's pain was nonexertional, nonpleuritic, nonpositional, not reproducible with palpation over the anterior chest wall.  Denies any preceding trauma.  Denies any associated orthopnea, PND, or new onset peripheral edema.  No recent calf tenderness or new onset lower extremity erythema.   She confirms that she is currently chest pain-free.  Medical history notable for essential hypertension, hyperlipidemia.  She also reports that she is a former smoker, given this but quit smoking approximately 4 years ago.  She also has a history of chronic diastolic heart failure, with most recent echocardiogram in April 2022 notable for LVEF 55 to 60%, no focal wall motion normalities, while showing evidence of grade 1 diastolic dysfunction.  Denies any recent subjective fever, chills, rigors, or generalized myalgias.  No recent cough or wheezing.     ED Course:  Vital signs in the ED were notable for the following: Tetramex 9.1; heart rate 8196; blood pressure initially 181 over with ensuing improvement to 124/89 following administration of Geodon, as above; respiratory rate 16-24, oxygen saturation 96 to 100% on room air.  Labs were notable for the following: BMP was notable for the following: Potassium 2.8, bicarbonate 21, anion gap 11 BUN 15, creatinine 1-2 1.45 on 01/08/2021.  Initial high-sensitivity troponin 19, with repeat value trending up slightly to 28, relative to most recent prior value of 11 on 01/09/2019.  CBC notable for episode count 6800, hemoglobin 11.9.  COVID-19/influenza PCR found to be negative.  Imaging and additional notable ED work-up: EKG, in comparison to most recent prior from 01/11/2021 showed sinus rhythm with heart rate 97, normal intervals, no evidence of T wave changes, and nonspecific less than 1 mm ST elevation in V2/V3, unchanged from most recent prior EKG, without any interval ST changes.  Chest x-ray showed no evidence of acute cardiopulmonary process, including no evidence of infiltrate, edema, effusion, or pneumothorax.  While in the ED, the following were administered: Norvasc 10 mg p.o. x1, losartan 50 mg p.o. x1, potassium chloride 40 mEq p.o.  x1, Geodon 20 mg IM x1.  Subsequently, the patient was admitted for overnight observation for further  evaluation management of presenting chest pain.     Review of Systems: As per HPI otherwise 10 point review of systems negative.   Past Medical History:  Diagnosis Date   Agitation 11/22/2017   Anoxic brain injury ([place]) 09/08/2016   C. Arrest due to respiratory failure and COPD exacerbation   Anxiety    Arthritis    "all over" (04/10/2016)   Asthma 10/18/2010   Binge eating disorder    Cardiac arrest ([place]) 09/08/2016   PEA   Carotid artery stenosis    1-39% bilateral by dopplers 11/2016   Chronic diastolic (congestive) heart failure (HCC)    Chronic kidney disease, stage 3 (HCC)    Chronic pain syndrome 06/18/2012   Chronic post-traumatic stress disorder (PTSD) 05/27/2018   Chronic respiratory failure with hypoxia and hypercapnia ([place]) 06/22/2015   TRILOGY Vent >AVAPA-ES., Vt target 200-400, Max P 30 , PS max 20 , PS min 6-10 , E Max 6, E Min 4, Rate Auto AVAPS Rate 2 (titrate for pt comfort) , bleed O2 at 5l/m continuous flow .    Closed displaced fracture of fifth metacarpal bone 03/21/2018   Cocaine use disorder, severe, in sustained remission ([place]) 78/58/8502   Complication of anesthesia    decreased bp, decreased heart rate   COPD (chronic obstructive pulmonary disease) ([place]) 07/08/2014   Depression    Diabetic neuropathy ([place]) 04/24/2011   Difficulty with speech 01/24/2018   Disorder of nervous system    Drug abuse ([place]) 11/21/2017   Dyslipidemia 04/24/2011   Elevated troponin 04/28/2012   Emphysema    Encephalopathy 11/21/2017   Essential hypertension 03/22/2016   Fibula fracture 07/10/2016   Frequent falls 10/11/2017   GERD (gastroesophageal reflux disease)    Gout 04/11/2017   Heart attack ([place]) 1980s   History of blood transfusion 1994   "couldn't stop bleeding from my period"   History of drug abuse in remission ([place]) 11/28/2015   Quit in 2017   Hyperlipidemia LDL goal <70    Incontinence    Manic depression ([place])    Morbid obesity ([place]) 10/18/2010    Obstructive sleep apnea 10/18/2010   On home oxygen therapy    "6L; 24/7" (04/10/2016)   OSA on CPAP    "wear mask sometimes" (04/10/2016)   Paranoid ([place])    "sometimes; I'm on RX for it" (04/10/2016)   Prolonged Q-T interval on ECG    Rectal bleeding 12/31/2015   Schizoaffective disorder, bipolar type ([place]) 04/05/2018   Seasonal allergies    Seborrheic keratoses 12/31/2013   Seizures ([place])    "don't know what kind; last one was ~ 1 yr ago" (04/10/2016)   Sinus bradycardia    Stroke ([place]) 1980s   denies residual on 04/10/2016   Thrush 09/19/2013   Type 2 diabetes mellitus ([place]) 10/18/2010    Past Surgical History:  Procedure Laterality Date   CESAREAN SECTION  1997   HERNIA REPAIR     IR CHOLANGIOGRAM EXISTING TUBE  07/20/2016   IR PERC CHOLECYSTOSTOMY  05/10/2016   IR RADIOLOGIST EVAL & MGMT  06/08/2016   IR RADIOLOGIST EVAL & MGMT  06/29/2016   IR SINUS/FIST TUBE CHK-NON GI  07/12/2016   RIGHT/LEFT HEART CATH AND CORONARY ANGIOGRAPHY N/A 06/19/2017   Procedure: RIGHT/LEFT HEART CATH AND CORONARY ANGIOGRAPHY;  Surgeon: Jolaine Artist, MD;  Location: [facility];  Service: Cardiovascular;  Laterality:  N/A;   TIBIA IM NAIL INSERTION Right 07/12/2016   Procedure: INTRAMEDULLARY (IM) NAIL RIGHT TIBIA;  Surgeon: Leandrew Koyanagi, MD;  Location: [facility];  Service: Orthopedics;  Laterality: Right;   UMBILICAL HERNIA REPAIR  ~ 1963   "that's why I don't have a belly button"   VAGINAL HYSTERECTOMY      Social History:  reports that she quit smoking about 4 years ago. Her smoking use included cigarettes. She started smoking about 43 years ago. She has a 57.00 pack-year smoking history. She has never used smokeless tobacco. She reports that she does not currently use drugs after having used the following drugs: Cocaine. She reports that she does not drink alcohol.   Allergies  Allergen Reactions   Hydrocodone Shortness Of Breath   Hydroxyzine Anaphylaxis and Shortness Of Breath    Latuda [Lurasidone Hcl] Anaphylaxis   Magnesium-Containing Compounds Anaphylaxis    Tolerated Ensure   Prednisone Anaphylaxis, Swelling and Other (See Comments)    Tongue swelling, lip swelling, throat swelling, per pt    Tramadol Anaphylaxis and Swelling   Codeine Nausea And Vomiting   Trazodone Other (See Comments)    paranoia   Topamax [Topiramate] Other (See Comments)    Increases paranoia   Sulfa Antibiotics Itching   Tape Rash    Family History  Problem Relation Age of Onset   Cancer Father        prostate   Cancer Mother        lung   Depression Mother    Depression Sister    Anxiety disorder Sister    Schizophrenia Sister    Bipolar disorder Sister    Depression Sister    Depression Brother    Heart failure Other        cousin    Family history reviewed and not pertinent    Prior to Admission medications   Medication Sig Start Date End Date Taking? Authorizing Provider  albuterol (PROVENTIL) (2.5 MG/3ML) 0.083% nebulizer solution Take 3 mLs (2.5 mg total) by nebulization every 6 (six) hours as needed for wheezing or shortness of breath. 12/16/20   Tiffany Rakes, MD  albuterol (VENTOLIN HFA) 108 (90 Base) MCG/ACT inhaler Inhale 2 puffs into the lungs every 6 (six) hours as needed for wheezing or shortness of breath. 12/16/20   Tiffany Rakes, MD  amLODipine (NORVASC) 5 MG tablet Take 1 tablet (5 mg total) by mouth daily. 12/26/20 01/25/21  Henderly, Britni A, PA-C  asenapine (SAPHRIS) 5 MG SUBL 24 hr tablet Place 2 tablets (10 mg total) under the tongue 2 (two) times daily. 11/17/20   [facility], Shuvon B, NP  atorvastatin (LIPITOR) 20 MG tablet Take 20 mg by mouth at bedtime. 09/27/20   [provider]  colchicine 0.6 MG tablet Take 2 tabs (1.2 mg) orally at the onset of gout flare, may repeat 1 tab (0.6 mg) 1 hour if symptoms persist 12/26/20   Henderly, Britni A, PA-C  cyclobenzaprine (FLEXERIL) 10 MG tablet Take 1 tablet (10 mg total) by mouth 2 (two) times  daily as needed. Patient taking differently: Take 10 mg by mouth 2 (two) times daily as needed for muscle spasms. 12/16/20   Tiffany Rakes, MD  diazepam (VALIUM) 5 MG tablet Take 1 tablet (5 mg total) by mouth every 8 (eight) hours as needed for anxiety. Patient taking differently: Take by mouth 3 (three) times daily. 11/15/20 01/14/21  Cobos, Myer Peer, MD  diclofenac (VOLTAREN) 75 MG EC tablet TAKE 1 TABLET(75 MG)  BY MOUTH TWICE DAILY AS NEEDED Strength: 75 mg Patient taking differently: Take 75 mg by mouth 2 (two) times daily as needed (for leg pain). 12/28/20   Tiffany Rakes, MD  diclofenac Sodium (VOLTAREN) 1 % GEL Apply 4 g topically 4 (four) times daily. 12/28/20   Tiffany Rakes, MD  dicyclomine (BENTYL) 10 MG capsule Take 1 capsule (10 mg total) by mouth 3 (three) times daily before meals. 12/24/20   Tiffany Rakes, MD  escitalopram (LEXAPRO) 10 MG tablet Take 1 tablet (10 mg total) by mouth daily for 15 days. 12/26/20 01/10/21  Henderly, Britni A, PA-C  febuxostat (ULORIC) 40 MG tablet Take 1 tablet (40 mg total) by mouth daily. Patient taking differently: Take 40 mg by mouth at bedtime. 10/15/20   Lacretia Leigh, MD  Fluticasone-Umeclidin-Vilant (TRELEGY ELLIPTA) 100-62.5-25 MCG/ACT AEPB Inhale 1 puff into the lungs daily. 12/16/20   Tiffany Rakes, MD  furosemide (LASIX) 80 MG tablet Take 1 tablet (80 mg total) by mouth daily. 12/26/20   Henderly, Britni A, PA-C  gabapentin (NEURONTIN) 600 MG tablet Take 1 tablet (600 mg total) by mouth 2 (two) times daily. 10/15/20   Lacretia Leigh, MD  Iron, Ferrous Sulfate, 325 (65 Fe) MG TABS Take 325 mg by mouth daily. 10/08/20   Tiffany Rakes, MD  ketoconazole (NIZORAL) 2 % cream Apply 1 application topically daily. Apply to both feet 09/27/20   [provider]  lidocaine (LIDODERM) 5 % Place 1 patch onto the skin daily. Remove & Discard patch within 12 hours or as directed by MD 12/26/20   Henderly, Britni A, PA-C  lisdexamfetamine  (VYVANSE) 20 MG capsule Take 20 mg by mouth daily.    [provider]  montelukast (SINGULAIR) 10 MG tablet Take 1 tablet (10 mg total) by mouth at bedtime. 12/16/20   Tiffany Rakes, MD  Multiple Vitamin (MULTIVITAMIN WITH MINERALS) TABS tablet Take 1 tablet by mouth daily. 08/16/20   Mercy Riding, MD  omeprazole (PRILOSEC) 40 MG capsule TAKE 1 CAPSULE(40 MG) BY MOUTH DAILY Patient taking differently: Take 40 mg by mouth daily. 10/23/20   Tiffany Rakes, MD  oxybutynin (DITROPAN) 5 MG tablet TAKE 1 TABLET(5 MG) BY MOUTH TWICE DAILY Patient taking differently: Take 5 mg by mouth 2 (two) times daily. 10/23/20   Tiffany Rakes, MD  potassium chloride SA (KLOR-CON M) 20 MEQ tablet Take 2 tablets (40 mEq total) by mouth 2 (two) times daily. 12/16/20   Tiffany Rakes, MD  prazosin (MINIPRESS) 2 MG capsule Take 1 capsule (2 mg total) by mouth at bedtime. 12/16/20   Tiffany Rakes, MD  sodium chloride (OCEAN) 0.65 % SOLN nasal spray Place 2 sprays into both nostrils daily.    [provider]  terbinafine (LAMISIL AT) 1 % cream Apply 1 application topically 2 (two) times daily. Patient taking differently: Apply 1 application topically 3 (three) times daily. 09/07/20   Tiffany Rakes, MD  thiamine 100 MG tablet Take 1 tablet (100 mg total) by mouth daily. 02/27/20   Modena Jansky, MD     Objective    Physical Exam: Vitals:   01/12/21 2200 01/12/21 2300 01/13/21 0000 01/13/21 0100  BP: (!) 153/108 (!) 144/123 127/89 (!) 138/92  Pulse: 88 81 80 86  Resp: 20 19 (!) 24 (!) 21  Temp:      TempSrc:      SpO2: 98% 100% 97% 96%  Weight:      Height:        General: appears to  be stated age; alert, oriented Skin: warm, dry, no rash Head:  AT/Gregory Mouth:  Oral mucosa membranes appear moist, normal dentition Neck: supple; trachea midline Heart:  RRR; did not appreciate any M/R/G Lungs: CTAB, did not appreciate any wheezes, rales, or rhonchi Abdomen: + BS; soft, ND, NT Vascular:  2+ pedal pulses b/l; 2+ radial pulses b/l Extremities: no peripheral edema, no muscle wasting Neuro: strength and sensation intact in upper and lower extremities b/l     Labs on Admission: I have personally reviewed following labs and imaging studies  CBC: Recent Labs  Lab 01/08/21 1311 01/12/21 1827  WBC 6.5 6.8  NEUTROABS  --  4.5  HGB 11.6* 11.9*  HCT 37.4 37.8  MCV 91.9 89.6  PLT 329 174   Basic Metabolic Panel: Recent Labs  Lab 01/08/21 1311 01/12/21 1827  NA 141 136  K 3.5 2.8*  CL 108 104  CO2 23 21*  GLUCOSE 77 112*  BUN 13 15  CREATININE 1.45* 1.49*  CALCIUM 9.8 9.5   GFR: Estimated Creatinine Clearance: 31.4 mL/min (A) (by C-G formula based on SCr of 1.49 mg/dL (H)). Liver Function Tests: No results for input(s): AST, ALT, ALKPHOS, BILITOT, PROT, ALBUMIN in the last 168 hours. No results for input(s): LIPASE, AMYLASE in the last 168 hours. No results for input(s): AMMONIA in the last 168 hours. Coagulation Profile: No results for input(s): INR, PROTIME in the last 168 hours. Cardiac Enzymes: No results for input(s): CKTOTAL, CKMB, CKMBINDEX, TROPONINI in the last 168 hours. BNP (last 3 results) Recent Labs    06/04/20 1140 06/15/20 1636 07/13/20 1310  PROBNP 11,232* 2,313* 201   HbA1C: No results for input(s): HGBA1C in the last 72 hours. CBG: No results for input(s): GLUCAP in the last 168 hours. Lipid Profile: No results for input(s): CHOL, HDL, LDLCALC, TRIG, CHOLHDL, LDLDIRECT in the last 72 hours. Thyroid Function Tests: No results for input(s): TSH, T4TOTAL, FREET4, T3FREE, THYROIDAB in the last 72 hours. Anemia Panel: No results for input(s): VITAMINB12, FOLATE, FERRITIN, TIBC, IRON, RETICCTPCT in the last 72 hours. Urine analysis:    Component Value Date/Time   COLORURINE STRAW (A) 11/16/2020 1549   APPEARANCEUR CLEAR 11/16/2020 1549   LABSPEC 1.005 11/16/2020 1549   PHURINE 6.0 11/16/2020 1549   GLUCOSEU NEGATIVE 11/16/2020 1549    HGBUR MODERATE (A) 11/16/2020 1549   BILIRUBINUR NEGATIVE 11/16/2020 1549   BILIRUBINUR NEG 05/21/2014 1155   KETONESUR NEGATIVE 11/16/2020 1549   PROTEINUR NEGATIVE 11/16/2020 1549   UROBILINOGEN 0.2 09/30/2019 1557   NITRITE NEGATIVE 11/16/2020 1549   LEUKOCYTESUR TRACE (A) 11/16/2020 1549    Radiological Exams on Admission: DG Chest 1 View  Result Date: 01/12/2021 CLINICAL DATA:  Short of breath and chest pain EXAM: CHEST  1 VIEW COMPARISON:  01/08/2021 FINDINGS: The heart size and mediastinal contours are within normal limits. Both lungs are clear. The visualized skeletal structures are unremarkable. IMPRESSION: No active disease. Electronically Signed   By: Randa Ngo M.D.   On: 01/12/2021 20:09     EKG: Independently reviewed, with result as described above.    Assessment/Plan    Principal Problem:   Atypical chest pain Active Problems:   Dyslipidemia   COPD (chronic obstructive pulmonary disease) (HCC)   Essential hypertension   Hypokalemia   GERD (gastroesophageal reflux disease)     #) Atypical chest pain: 1 episode of substernal chest discomfort that was nonexertional but rather associated with any shortness of breath completely resolved following dose of IM  Geodon, without any subsequent or residual chest pain.  Chest x-ray showed no evidence of acute cardiopulmonary process.  EKG, symptoms recent prior, showed no evidence of acute ischemic process.  High-sensitivity troponin 19, with repeat value 28, potentially on the basis of renal clearance with concomitant increase in serum creatinine, as further quantified above.  Overall, appears atypical for but in the setting of patient's multiple CAD risk factors, including hypertension, hyperlipidemia, and the smoker, will further assess by ruling out ACS.  Additionally, will check echocardiogram in the morning to evaluate for any evidence of interval focal wall motion abnormalities telemetry further guide need/nature  for additional ischemic evaluation.  At this time, differential appears to favor generalized anxiety disorder, as the patient's chest pain was associated with anxiety that, with ensuing complete resolution of both chest pain/anxiety with dose of Geodon.  Status post losartan in the ED.  Plan: Monitor on symmetry.  Continue to trend troponin.  Add on serum magnesium level.  CMP in the morning CBC in the morning.  Check lipase.  Echocardiogram in the morning.  As needed IV Ativan for subsequent anxiety.  Continue atorvastatin.  Continue home PPI.       #) Essential Hypertension: documented h/o such, with outpatient antihypertensive regimen including Norvasc.  SBP's in the ED today were initially elevated in the setting of anxiety with ensuing improvement normotensive values improvement in anxiety via interval IM dose of Geodon, as above.    Plan: Close monitoring of subsequent BP via routine VS. continue home Norvasc.      #) Hypokalemia: Presenting serum potassium level 2.8.  She received 40 mEq p.o. potassium in the ED this evening.  In the context of stage IIIa chronic kidney disease will be slightly less aggressive in pursuit of additional potassium supplementation, in order to reduce risk of ensuing development of hyperkalemia.  Plan: Potassium chloride 20 mEq p.o. x1 dose now.  Repeat BMP tomorrow.  Add on serum magnesium level.  Monitor on telemetry.        #) Generalized anxiety disorder: Scheduled Valium sensation.  Plan: Prn IV Ativan for anxiety, as above.           #) Chronic diastolic heart failure: documented history of such, with most recent echocardiogram performed in April 2022, with results notable for LVEF 55 to 60% as well as grade 1 diastolic dysfunction. No clinical evidence to suggest acutely decompensated heart failure at this time. home diuretic regimen reportedly consists of the following: Lasix 80 mg p.o. daily.  At this time, the patient appears.   will assess interval volume status for assistance and determination of timing of resumption of home Lasix.  Plan: Monitor strict I's & O's and daily weights. Repeat BMP in AM. Check serum mag level.  Echo has been ordered for the morning as component of evaluation for presenting chest pain, as above.       #) COPD: Documented history of such, in the setting of being a former smoker. Trelegy Ellipta as well as prn albuterol as an outpatient.  No evidence of acute exacerbation at this time.  Plan: Continue home Trelegy Ellipta as well as prn albuterol inhaler.         #) Hyperlipidemia: documented h/o such. On atorvastatin as outpatient.    Plan: continue home statin.          #) GERD: documented h/o such; on omeprazole as outpatient.   Plan: continue home PPI.        DVT prophylaxis:  SCD's   Code Status: Full code Family Communication: none Disposition Plan: Per Rounding Team Consults called: none;  Admission status: obs; med-tele  PLEASE NOTE THAT DRAGON DICTATION SOFTWARE WAS USED IN THE CONSTRUCTION OF THIS NOTE.   [place] DO [facility] [place]   01/13/2021, 1:27 AM

## 2021-01-13 NOTE — Progress Notes (Signed)
Echocardiogram 2D Echocardiogram has been performed.  [place] Deputy Anyssa Sharpless RDCS 01/13/2021, 9:02 AM

## 2021-01-13 NOTE — ED Notes (Signed)
Pt appears to be sleeping at this time. Mouth opened, even rise and fall of chest. Bed in lowest position, call bell in reach.

## 2021-01-13 NOTE — Telephone Encounter (Addendum)
Medication Refill - Medication: colchicine 0.6 MG tablet  Has the patient contacted their pharmacy? No. But she is going to call them she said.  Pt states this is a med she needs all the time, dur to ongoing gout.  Preferred Pharmacy (with phone number or street name): [facility], [place]  Has the patient been seen for an appointment in the last year OR does the patient have an upcoming appointment? Yes.    Agent: Please be advised that RX refills may take up to 3 business days. We ask that you follow-up with your pharmacy.

## 2021-01-13 NOTE — ED Notes (Signed)
Psychiatrist at bedside with pt at this time.

## 2021-01-13 NOTE — Discharge Instructions (Signed)
For your behavioral health needs you are advised to keep your previously scheduled appointment at [facility].  They offer psychiatry and therapy:       [facility]      [address]      [place], [place] [address]      [phone]

## 2021-01-13 NOTE — ED Notes (Signed)
Pt state he feet hurt. Info pt will info MD about pain. Pt remove cords from her so she will be disconnected from monitor. Admitting MD at bedside
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
## 2021-01-13 NOTE — ED Notes (Signed)
Pt in hallway stating she needs to leave. Pt also stating that she doesn't feel safe and that she feels like staff is trying to kill her. Pt trying to leave. Staff trying to redirect pt, pt unable to be redirected. Pt placed back in bed and PRN ativan given

## 2021-01-13 NOTE — Progress Notes (Signed)
Attempted IV start x1 with no success. Patient refusing second attempt. RN made aware.

## 2021-01-13 NOTE — Telephone Encounter (Signed)
Call placed to [facility]. She said that she just received the referral today despite it being faxed to her office a week ago. She said she will need to review and check staffing and will call this CM back. Patient currently in ED at [facility].   Call placed to [facility], spoke to [place] who said they are not in network with patient's insurance

## 2021-01-13 NOTE — ED Provider Notes (Signed)
Nursing notes and vitals signs, including pulse oximetry, reviewed.  Summary of this visit's results, reviewed by myself:  EKG:  EKG Interpretation  Date/Time:  Wednesday January 12 2021 19:22:53 EST Ventricular Rate:  97 PR Interval:  132 QRS Duration: 74 QT Interval:  330 QTC Calculation: 419 R Axis:   32 Text Interpretation: Normal sinus rhythm Nonspecific T wave abnormality Abnormal ECG When compared with ECG of 08-Jan-2021 13:15, PREVIOUS ECG IS PRESENT Confirmed by Thamas Jaegers ([id number]) on 01/12/2021 9:10:59 PM        Labs:  Results for orders placed or performed during the hospital encounter of 01/12/21 (from the past 24 hour(s))  CBC with Differential     Status: Abnormal   Collection Time: 01/12/21  6:27 PM  Result Value Ref Range   WBC 6.8 4.0 - 10.5 K/uL   RBC 4.22 3.87 - 5.11 MIL/uL   Hemoglobin 11.9 (L) 12.0 - 15.0 g/dL   HCT 37.8 36.0 - 46.0 %   MCV 89.6 80.0 - 100.0 fL   MCH 28.2 26.0 - 34.0 pg   MCHC 31.5 30.0 - 36.0 g/dL   RDW 15.8 (H) 11.5 - 15.5 %   Platelets 347 150 - 400 K/uL   nRBC 0.0 0.0 - 0.2 %   Neutrophils Relative % 65 %   Neutro Abs 4.5 1.7 - 7.7 K/uL   Lymphocytes Relative 22 %   Lymphs Abs 1.5 0.7 - 4.0 K/uL   Monocytes Relative 11 %   Monocytes Absolute 0.7 0.1 - 1.0 K/uL   Eosinophils Relative 0 %   Eosinophils Absolute 0.0 0.0 - 0.5 K/uL   Basophils Relative 1 %   Basophils Absolute 0.0 0.0 - 0.1 K/uL   Immature Granulocytes 1 %   Abs Immature Granulocytes 0.04 0.00 - 0.07 K/uL  Basic metabolic panel     Status: Abnormal   Collection Time: 01/12/21  6:27 PM  Result Value Ref Range   Sodium 136 135 - 145 mmol/L   Potassium 2.8 (L) 3.5 - 5.1 mmol/L   Chloride 104 98 - 111 mmol/L   CO2 21 (L) 22 - 32 mmol/L   Glucose, Bld 112 (H) 70 - 99 mg/dL   BUN 15 8 - 23 mg/dL   Creatinine, Ser 1.49 (H) 0.44 - 1.00 mg/dL   Calcium 9.5 8.9 - 10.3 mg/dL   GFR, Estimated 40 (L) >60 mL/min   Anion gap 11 5 - 15  Troponin I (High Sensitivity)      Status: Abnormal   Collection Time: 01/12/21  6:27 PM  Result Value Ref Range   Troponin I (High Sensitivity) 19 (H) <18 ng/L  Troponin I (High Sensitivity)     Status: Abnormal   Collection Time: 01/12/21  8:27 PM  Result Value Ref Range   Troponin I (High Sensitivity) 28 (H) <18 ng/L   *Note: Due to a large number of results and/or encounters for the requested time period, some results have not been displayed. A complete set of results can be found in Results Review.    Imaging Studies: DG Chest 1 View  Result Date: 01/12/2021 CLINICAL DATA:  Short of breath and chest pain EXAM: CHEST  1 VIEW COMPARISON:  01/08/2021 FINDINGS: The heart size and mediastinal contours are within normal limits. Both lungs are clear. The visualized skeletal structures are unremarkable. IMPRESSION: No active disease. Electronically Signed   By: Randa Ngo M.D.   On: 01/12/2021 20:09    1:01 AM Patient's second troponin has gone  up to 28.  [name] now somnolent due to Geodon.  Her blood pressure is improved at 127/89.  1:14 AM Dr. Velia Meyer to admit for observation.   Devone Bonilla, Jenny Reichmann, MD 01/13/21 [id number]

## 2021-01-13 NOTE — Telephone Encounter (Signed)
Please follow up with patient once discharged regarding living facility.

## 2021-01-13 NOTE — Telephone Encounter (Signed)
Copied from [place] [phone]. Topic: General - Other >> Jan 13, 2021 12:33 PM Alanda Slim E wrote: Reason for CRM: Pt would like a call From Dr. Margarita Rana about getting into a nice independent senior living facility/ Pt called from [phone](the hospital) ER room 10 / please advise

## 2021-01-14 ENCOUNTER — Other Ambulatory Visit (HOSPITAL_COMMUNITY): Payer: Self-pay

## 2021-01-14 ENCOUNTER — Other Ambulatory Visit: Payer: Self-pay | Admitting: Family Medicine

## 2021-01-14 DIAGNOSIS — F200 Paranoid schizophrenia: Secondary | ICD-10-CM

## 2021-01-14 DIAGNOSIS — I10 Essential (primary) hypertension: Secondary | ICD-10-CM

## 2021-01-14 DIAGNOSIS — E876 Hypokalemia: Secondary | ICD-10-CM | POA: Diagnosis not present

## 2021-01-14 DIAGNOSIS — J42 Unspecified chronic bronchitis: Secondary | ICD-10-CM

## 2021-01-14 DIAGNOSIS — R0789 Other chest pain: Secondary | ICD-10-CM | POA: Diagnosis not present

## 2021-01-14 DIAGNOSIS — F22 Delusional disorders: Secondary | ICD-10-CM | POA: Diagnosis not present

## 2021-01-14 DIAGNOSIS — D649 Anemia, unspecified: Secondary | ICD-10-CM

## 2021-01-14 DIAGNOSIS — N1832 Chronic kidney disease, stage 3b: Secondary | ICD-10-CM

## 2021-01-14 DIAGNOSIS — R45851 Suicidal ideations: Secondary | ICD-10-CM | POA: Diagnosis not present

## 2021-01-14 LAB — RENAL FUNCTION PANEL
Albumin: 3.2 g/dL — ABNORMAL LOW (ref 3.5–5.0)
Anion gap: 5 (ref 5–15)
BUN: 22 mg/dL (ref 8–23)
CO2: 22 mmol/L (ref 22–32)
Calcium: 8.6 mg/dL — ABNORMAL LOW (ref 8.9–10.3)
Chloride: 107 mmol/L (ref 98–111)
Creatinine, Ser: 1.6 mg/dL — ABNORMAL HIGH (ref 0.44–1.00)
GFR, Estimated: 36 mL/min — ABNORMAL LOW (ref >60)
Glucose, Bld: 94 mg/dL (ref 70–99)
Phosphorus: 4.5 mg/dL (ref 2.5–4.6)
Potassium: 4.1 mmol/L (ref 3.5–5.1)
Sodium: 134 mmol/L — ABNORMAL LOW (ref 135–145)

## 2021-01-14 LAB — RETICULOCYTES
Immature Retic Fract: 8.4 % (ref 2.3–15.9)
RBC.: 3.45 MIL/uL — ABNORMAL LOW (ref 3.87–5.11)
Retic Count, Absolute: 67.6 10*3/uL (ref 19.0–186.0)
Retic Ct Pct: 2 % (ref 0.4–3.1)

## 2021-01-14 LAB — CBC
HCT: 31.1 % — ABNORMAL LOW (ref 36.0–46.0)
Hemoglobin: 9.9 g/dL — ABNORMAL LOW (ref 12.0–15.0)
MCH: 28.3 pg (ref 26.0–34.0)
MCHC: 31.8 g/dL (ref 30.0–36.0)
MCV: 88.9 fL (ref 80.0–100.0)
Platelets: 349 10*3/uL (ref 150–400)
RBC: 3.5 MIL/uL — ABNORMAL LOW (ref 3.87–5.11)
RDW: 15.9 % — ABNORMAL HIGH (ref 11.5–15.5)
WBC: 5 10*3/uL (ref 4.0–10.5)
nRBC: 0 % (ref 0.0–0.2)

## 2021-01-14 LAB — IRON AND TIBC
Iron: 49 ug/dL (ref 28–170)
Saturation Ratios: 17 % (ref 10.4–31.8)
TIBC: 296 ug/dL (ref 250–450)
UIBC: 247 ug/dL

## 2021-01-14 LAB — VITAMIN B12: Vitamin B-12: 273 pg/mL (ref 180–914)

## 2021-01-14 LAB — FOLATE: Folate: 8.9 ng/mL (ref >5.9)

## 2021-01-14 LAB — FERRITIN: Ferritin: 70 ng/mL (ref 11–307)

## 2021-01-14 LAB — MAGNESIUM: Magnesium: 1.7 mg/dL (ref 1.7–2.4)

## 2021-01-14 MED ORDER — CYANOCOBALAMIN 1000 MCG PO TABS
1000.0000 ug | ORAL_TABLET | Freq: Every day | ORAL | 1 refills | Status: DC
Start: 2021-01-14 — End: 2021-04-04
  Filled 2021-01-14: qty 90, 90d supply, fill #0

## 2021-01-14 MED ORDER — ALBUTEROL SULFATE HFA 108 (90 BASE) MCG/ACT IN AERS
2.0000 | INHALATION_SPRAY | Freq: Four times a day (QID) | RESPIRATORY_TRACT | 0 refills | Status: DC | PRN
  Filled 2021-01-14: qty 8.5, 25d supply, fill #0
  Filled 2021-02-07: qty 8.5, 30d supply, fill #0

## 2021-01-14 MED ORDER — TRELEGY ELLIPTA 100-62.5-25 MCG/ACT IN AEPB
1.0000 | INHALATION_SPRAY | Freq: Every day | RESPIRATORY_TRACT | 6 refills | Status: DC
Start: 2021-01-14 — End: 2021-06-24
  Filled 2021-01-14: qty 60, 30d supply, fill #0

## 2021-01-14 MED ORDER — PRAZOSIN HCL 2 MG PO CAPS
2.0000 mg | ORAL_CAPSULE | Freq: Every day | ORAL | 0 refills | Status: DC
  Filled 2021-01-14: qty 30, 30d supply, fill #0

## 2021-01-14 MED ORDER — ALBUTEROL SULFATE (2.5 MG/3ML) 0.083% IN NEBU: 3.0000 mL | INHALATION_SOLUTION | RESPIRATORY_TRACT | Status: DC | PRN

## 2021-01-14 MED ORDER — FEBUXOSTAT 40 MG PO TABS
40.0000 mg | ORAL_TABLET | Freq: Every day | ORAL | 1 refills | Status: DC
  Filled 2021-01-14 – 2021-02-03 (×4): qty 30, 30d supply, fill #0

## 2021-01-14 MED ORDER — GABAPENTIN 600 MG PO TABS
600.0000 mg | ORAL_TABLET | Freq: Two times a day (BID) | ORAL | 1 refills | Status: DC
  Filled 2021-01-14: qty 60, 30d supply, fill #0
  Filled 2021-02-03 – 2021-02-07 (×2): qty 60, 30d supply, fill #1

## 2021-01-14 MED ORDER — MONTELUKAST SODIUM 10 MG PO TABS
10.0000 mg | ORAL_TABLET | Freq: Every day | ORAL | 6 refills | Status: DC
  Filled 2021-01-14: qty 30, 30d supply, fill #0

## 2021-01-14 MED ORDER — COLCHICINE 0.6 MG PO TABS
ORAL_TABLET | ORAL | 0 refills | Status: DC
  Filled 2021-01-14: qty 15, 5d supply, fill #0

## 2021-01-14 MED ORDER — DICLOFENAC SODIUM 75 MG PO TBEC: DELAYED_RELEASE_TABLET | ORAL | 0 refills | Status: DC

## 2021-01-14 MED ORDER — ASENAPINE MALEATE 5 MG SL SUBL
10.0000 mg | SUBLINGUAL_TABLET | Freq: Two times a day (BID) | SUBLINGUAL | 0 refills | Status: DC
  Filled 2021-01-14: qty 60, 15d supply, fill #0

## 2021-01-14 MED ORDER — ATORVASTATIN CALCIUM 20 MG PO TABS
20.0000 mg | ORAL_TABLET | Freq: Every evening | ORAL | 1 refills | Status: DC
  Filled 2021-01-14: qty 30, 30d supply, fill #0

## 2021-01-14 MED ORDER — ADULT MULTIVITAMIN W/MINERALS CH: 1.0000 | ORAL_TABLET | Freq: Every day | ORAL | Status: DC

## 2021-01-14 MED ORDER — FUROSEMIDE 40 MG PO TABS
40.0000 mg | ORAL_TABLET | Freq: Every day | ORAL | 1 refills | Status: DC
  Filled 2021-01-14: qty 30, 30d supply, fill #0

## 2021-01-14 MED ORDER — ESCITALOPRAM OXALATE 10 MG PO TABS
10.0000 mg | ORAL_TABLET | Freq: Every day | ORAL | 0 refills | Status: DC
  Filled 2021-01-14: qty 30, 30d supply, fill #0

## 2021-01-14 MED ORDER — DIAZEPAM 5 MG PO TABS
5.0000 mg | ORAL_TABLET | Freq: Three times a day (TID) | ORAL | Status: DC
  Administered 2021-01-14: 13:00:00 5 mg via ORAL
  Filled 2021-01-14: qty 1

## 2021-01-14 MED ORDER — AMLODIPINE BESYLATE 10 MG PO TABS
10.0000 mg | ORAL_TABLET | Freq: Every day | ORAL | 1 refills | Status: DC
  Filled 2021-01-14: qty 30, 30d supply, fill #0

## 2021-01-14 MED ORDER — IRON (FERROUS SULFATE) 325 (65 FE) MG PO TABS
325.0000 mg | ORAL_TABLET | Freq: Every day | ORAL | 2 refills | Status: DC
  Filled 2021-01-14: qty 60, fill #0

## 2021-01-14 MED ORDER — POTASSIUM CHLORIDE CRYS ER 20 MEQ PO TBCR
20.0000 meq | EXTENDED_RELEASE_TABLET | Freq: Every day | ORAL | 0 refills | Status: DC
  Filled 2021-01-14: qty 30, 30d supply, fill #0

## 2021-01-14 MED ORDER — DIAZEPAM 5 MG PO TABS
5.0000 mg | ORAL_TABLET | Freq: Three times a day (TID) | ORAL | 1 refills | Status: AC | PRN
  Filled 2021-01-14: qty 8, 3d supply, fill #0
  Filled 2021-01-24 – 2021-02-03 (×2): qty 8, 3d supply, fill #1

## 2021-01-14 MED ORDER — VITAMIN B-12 1000 MCG PO TABS
1000.0000 ug | ORAL_TABLET | Freq: Every day | ORAL | Status: DC
  Filled 2021-01-14: qty 1

## 2021-01-14 NOTE — Progress Notes (Signed)
AVS given to patient and explained at the bedside. Medications and follow up appointments have been explained with pt verbalizing understanding.  
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
## 2021-01-14 NOTE — Telephone Encounter (Signed)
Requested Prescriptions  Pending Prescriptions Disp Refills   diclofenac (VOLTAREN) 75 MG EC tablet 60 tablet 0    Sig: TAKE 1 TABLET(75 MG) BY MOUTH TWICE DAILY AS NEEDED Strength: 75 mg     Analgesics:  NSAIDS Failed - 01/14/2021 11:50 AM      Failed - Cr in normal range and within 360 days    Creat  Date Value Ref Range Status  02/18/2016 1.22 (H) 0.50 - 1.05 mg/dL Final    Comment:      For patients > or = 61 years of age: The upper reference limit for Creatinine is approximately 13% higher for people identified as African-American.      Creatinine, Ser  Date Value Ref Range Status  01/14/2021 1.60 (H) 0.44 - 1.00 mg/dL Final   Creatinine,U  Date Value Ref Range Status  06/17/2012 521.94 mg/dL Final    Comment:    Result confirmed by automatic dilution.    Cutoff Values for Urine Drug Screen, Pain Mgmt          Drug Class           Cutoff (ng/mL)          Amphetamines             500          Barbiturates             200          Cocaine Metabolites      150          Benzodiazepines          200          Methadone                300          Opiates                  300          Phencyclidine             25          Propoxyphene             300          Marijuana Metabolites     50    For medical purposes only.   Creatinine, Urine  Date Value Ref Range Status  02/19/2020 300.84 mg/dL Final    Comment:    Performed at [facility], [place] [address] [place], [place] [address]         Failed - HGB in normal range and within 360 days    Hemoglobin  Date Value Ref Range Status  01/14/2021 9.9 (L) 12.0 - 15.0 g/dL Final  11/25/2019 9.0 (L) 11.1 - 15.9 g/dL Final         Passed - Patient is not pregnant      Passed - Valid encounter within last 12 months    Recent Outpatient Visits          1 week ago Pain in both lower extremities   [place], [place]obong, MD   1 month ago Adnexal cyst   [place], Charlane Ferretti, MD   1 month ago Neuropathy   [place], [place]   3 months ago Diarrhea, unspecified type   [facility] [name], MD  4 months ago Tinea pedis of both feet   [place], Enobong, MD      Future Appointments            In 3 weeks Gilford Rile, Martie Lee, NP [facility]

## 2021-01-14 NOTE — Evaluation (Signed)
Physical Therapy Evaluation Patient Details Name: Tiffany [name] MRN: [id number] DOB: March 17, 1960 Today's Date: 01/14/2021  History of Present Illness  Pt is 60 yo female brought to behavioral health by police with concern for suicidal attempt and transferred to ED with chest pain on 01/12/21.  Pt with hx including CHF, CKD, HTN, HLD, COPD, GERD, anxiety, depression, PTSD, bipolar, and paranoid schizophrenia. Pt evaluated by psychiatry  Clinical Impression  Pt admitted with above diagnosis. Pt is questionable historian but reports she was independent with mobility at baseline; has intermittent assist with IADLs.  Does report hx of falls and using RW.   Today, pt able to ambulate short distance with RW and supervision.  She has foot drop on R but reports baseline.  Does state she feels weak and lightheaded at times but declined min guard (provided close supervision).  Pt is likely near baseline , possible slight decline in mobility.  Recommend HHPT level care at d/c.  Pt currently with functional limitations due to the deficits listed below (see PT Problem List). Pt will benefit from skilled PT to increase their independence and safety with mobility to allow discharge to the venue listed below.          Recommendations for follow up therapy are one component of a multi-disciplinary discharge planning process, led by the attending physician.  Recommendations may be updated based on patient status, additional functional criteria and insurance authorization.  Follow Up Recommendations Home health PT    Assistance Recommended at Discharge Intermittent Supervision/Assistance  Functional Status Assessment Patient has had a recent decline in their functional status and demonstrates the ability to make significant improvements in function in a reasonable and predictable amount of time. (mild decrease)  Equipment Recommendations  None recommended by PT    Recommendations for Other Services        Precautions / Restrictions Precautions Precautions: Fall Restrictions Weight Bearing Restrictions: No      Mobility  Bed Mobility Overal bed mobility: Needs Assistance Bed Mobility: Supine to Sit;Sit to Supine     Supine to sit: Modified independent (Device/Increase time) Sit to supine: Modified independent (Device/Increase time)        Transfers Overall transfer level: Needs assistance Equipment used: Rolling walker (2 wheels) Transfers: Sit to/from Stand Sit to Stand: Supervision           General transfer comment: pt declined min guard    Ambulation/Gait Ambulation/Gait assistance: Supervision Gait Distance (Feet): 24 Feet Assistive device: Rolling walker (2 wheels) Gait Pattern/deviations: Step-to pattern;Decreased stride length;Decreased dorsiflexion - right Gait velocity: decrased     General Gait Details: Pt compensating for foot drop on R with steppage gait; provided close supervision but pt declined min guard  Stairs            Wheelchair Mobility    Modified Rankin (Stroke Patients Only)       Balance Overall balance assessment: Needs assistance;History of Falls Sitting-balance support: No upper extremity supported Sitting balance-Leahy Scale: Good     Standing balance support: Bilateral upper extremity supported;No upper extremity supported Standing balance-Leahy Scale: Fair Standing balance comment: Could static stand without AD; light RW use to ambulate                             Pertinent Vitals/Pain Pain Assessment: No/denies pain    Home Living Family/patient expects to be discharged to:: Unsure Living Arrangements: Alone Available Help at Discharge: Personal care attendant;Family (  pt reports her mother and aides assist at times) Type of Home: [place]: Standard (2 wheels) Additional Comments: Pt reports she has an apartment but is not going back there because no heat. Says  she wants to go to an ILF facility    Prior Function Prior Level of Function : Independent/Modified Independent             Mobility Comments: Reports ambulates with RW and has hx of falls.  States that she is getting w/c ADLs Comments: Pt reports able to complete ADLs and IADLs but with some difficulty     Hand Dominance   Dominant Hand: Right    Extremity/Trunk Assessment   Upper Extremity Assessment Upper Extremity Assessment: Defer to OT evaluation    Lower Extremity Assessment Lower Extremity Assessment: LLE deficits/detail;RLE deficits/detail RLE Deficits / Details: ROM WFL; MMT: ankle 1/5 (baseline), knee 4/5, hip 4/5 LLE Deficits / Details: ROM WFL; MMT: 4+/5 throughout    Cervical / Trunk Assessment Cervical / Trunk Assessment: Normal  Communication   Communication: No difficulties  Cognition Arousal/Alertness: Awake/alert Behavior During Therapy: WFL for tasks assessed/performed Overall Cognitive Status: History of cognitive impairments - at baseline                                 General Comments: Pt cooperative for therapy but does like to do things on her own and declining min guard        General Comments      Exercises     Assessment/Plan    PT Assessment Patient needs continued PT services  PT Problem List Decreased strength;Decreased mobility;Decreased range of motion;Decreased activity tolerance;Decreased balance;Decreased knowledge of use of DME;Decreased safety awareness;Decreased cognition       PT Treatment Interventions DME instruction;Therapeutic activities;Gait training;Therapeutic exercise;Patient/family education;Balance training;Stair training;Functional mobility training    PT Goals (Current goals can be found in the Care Plan section)  Acute Rehab PT Goals Patient Stated Goal: go to ILF facility PT Goal Formulation: With patient Time For Goal Achievement: 01/28/21 Potential to Achieve Goals: Fair     Frequency Min 3X/week   Barriers to discharge        Co-evaluation               AM-PAC PT "6 Clicks" Mobility  Outcome Measure Help needed turning from your back to your side while in a flat bed without using bedrails?: None Help needed moving from lying on your back to sitting on the side of a flat bed without using bedrails?: None Help needed moving to and from a bed to a chair (including a wheelchair)?: A Little Help needed standing up from a chair using your arms (e.g., wheelchair or bedside chair)?: A Little Help needed to walk in hospital room?: A Little Help needed climbing 3-5 steps with a railing? : A Little 6 Click Score: 20    End of Session   Activity Tolerance: Patient tolerated treatment well Patient left: in bed;with call bell/phone within reach;with bed alarm set Nurse Communication: Mobility status PT Visit Diagnosis: Other abnormalities of gait and mobility (R26.89);Muscle weakness (generalized) (M62.81);History of falling (Z91.81)    Time: 2458-0998 PT Time Calculation (min) (ACUTE ONLY): 20 min   Charges:   PT Evaluation $PT Eval Low Complexity: 1 Low          Bartt Gonzaga, PT Acute Rehab  Services Pager [phone] [name] [place] 01/14/2021, 3:16 PM

## 2021-01-14 NOTE — TOC Initial Note (Addendum)
Transition of Care [facility]) - Initial/Assessment Note    Patient Details  Name: Tiffany [name] MRN: [id number] Date of Birth: Jan 23, 1960  [facility]) CM/SW Contact:    Dessa Phi, RN Phone Number: 01/14/2021, 9:47 AM  Clinical Narrative: Referral for behavioral issues-noted psych rep note-patient has f/u w/otpt Psych, & therapy on d/c instructions. No further CM needs.  11:26a-spoke to patient about d/c plans-has a safe home, she doesn't plan to go to rehab-informed her PT will eval.await recc.                 Expected Discharge Plan: Home/Self Care Barriers to Discharge: Continued Medical Work up   Patient Goals and CMS Choice Patient states their goals for this hospitalization and ongoing recovery are:: go home      Expected Discharge Plan and Services Expected Discharge Plan: Home/Self Care                                              Prior Living Arrangements/Services     Patient language and need for interpreter reviewed:: Yes Do you feel safe going back to the place where you live?: Yes      Need for Family Participation in Patient Care: No (Comment) Care giver support system in place?: Yes (comment)   Criminal Activity/Legal Involvement Pertinent to Current Situation/Hospitalization: No - Comment as needed  Activities of Daily Living Home Assistive Devices/Equipment: Walker (specify type) ADL Screening (condition at time of admission) Patient's cognitive ability adequate to safely complete daily activities?: Yes Is the patient deaf or have difficulty hearing?: No Does the patient have difficulty seeing, even when wearing glasses/contacts?: No Does the patient have difficulty concentrating, remembering, or making decisions?: No Patient able to express need for assistance with ADLs?: Yes Does the patient have difficulty dressing or bathing?: No Independently performs ADLs?: Yes (appropriate for developmental age) Does the patient have  difficulty walking or climbing stairs?: Yes Weakness of Legs: Both Weakness of Arms/Hands: None  Permission Sought/Granted Permission sought to share information with : Case Manager Permission granted to share information with : Yes, Verbal Permission Granted  Share Information with NAME: Case manager           Emotional Assessment Appearance:: Appears stated age Attitude/Demeanor/Rapport: Engaged Affect (typically observed): Accepting Orientation: : Oriented to Self, Oriented to Place, Oriented to  Time, Oriented to Situation Alcohol / Substance Use: Not Applicable Psych Involvement: Yes (comment)  Admission diagnosis:  Suicidal ideation [R45.851] Hypokalemia [E87.6] Pain [R52] Elevated troponin [R77.8] Chest pain [R07.9] Chest pain, unspecified type [R07.9] Patient Active Problem List   Diagnosis Date Noted   Atypical chest pain 01/13/2021   GERD (gastroesophageal reflux disease)    Neuropathy 11/18/2020   Pain in both feet 11/18/2020   Localized edema 11/18/2020   Encounter for medication management 11/17/2020   Protein-calorie malnutrition, severe 05/15/2020   Malnutrition of moderate degree 19/62/2297   Periumbilical abdominal pain    Dysphagia    Nausea vomiting and diarrhea    Generalized abdominal pain    E. coli UTI 98/92/1194   Acute metabolic encephalopathy 17/40/8144   Psychosis ([place]) 02/20/2020   Schizophrenia, schizo-affective ([place]) 81/85/6314   Metabolic acidosis, normal anion gap (NAG) 02/20/2020   Altered mental status    Hypocalcemia    AKI (acute kidney injury) ([place]) 02/18/2020   Pelvic mass in female 01/02/2020  Genital herpes 11/25/2019   Stage 2 skin ulcer of sacral region ([place]) 10/28/2019   ARF (acute renal failure) ([place]) 10/27/2019   Family discord 02/04/2019   Aggressive behavior    PTSD (post-traumatic stress disorder) 05/27/2018   Schizoaffective disorder, bipolar type ([place]) 04/05/2018   Closed displaced fracture of fifth metacarpal  bone 03/21/2018   Difficulty with speech 01/24/2018   Encephalopathy 11/21/2017   Drug abuse ([place]) 11/21/2017   Frequent falls 10/11/2017   Binge eating disorder    Dependence on continuous supplemental oxygen 05/14/2017   Gout 04/11/2017   Hypomagnesemia    CKD (chronic kidney disease) stage 3, GFR 30-59 ml/min (HCC) 12/15/2016   Carotid artery stenosis    Hypokalemia    Osteoarthritis 10/26/2016   Anoxic brain injury ([place]) 09/08/2016   Overactive bladder 06/07/2016   QT prolongation    OSA and COPD overlap syndrome ([place])    Arthritis    Essential hypertension 03/22/2016   Cocaine use disorder, severe, in sustained remission ([place]) 12/17/2015   History of drug abuse in remission ([place]) 11/28/2015   Hyponatremia 11/25/2015   Chronic diastolic congestive heart failure (HCC)    Chronic respiratory failure with hypoxia ([place]) 06/22/2015   Tobacco use disorder 07/22/2014   COPD (chronic obstructive pulmonary disease) ([place]) 07/08/2014   Seizure ([place]) 01/04/2013   Chronic pain syndrome 06/18/2012   Dyslipidemia 04/24/2011   Anemia 04/24/2011   Diabetic neuropathy ([place]) 04/24/2011   Morbid obesity ([place]) 10/18/2010   Type 2 diabetes mellitus ([place]) 10/18/2010   PCP:  [name], MD Pharmacy:   [place], [place] - [address] [place] [address] Phone: [phone] Fax: [phone]  [id number] - [place], [place] - [address] [place] [address] Phone: [phone] Fax: [phone]     Social Determinants of Health (SDOH) Interventions    Readmission Risk Interventions Readmission Risk Prevention Plan 07/26/2020 02/23/2020  Transportation Screening Complete Complete  Medication Review (RN Care Manager) Complete Complete  PCP or Specialist appointment within 3-5 days of discharge Complete Complete  HRI or Home Care Consult Complete  Complete  SW Recovery Care/Counseling Consult Complete Complete  Palliative Care Screening Not Applicable Complete  Skilled Nursing Facility Complete Complete  Some recent data might be hidden

## 2021-01-14 NOTE — Telephone Encounter (Signed)
I called [facility] to get clarification on the duplicate refill request for the Voltaren 75 mg tablets.  She also requested an albuterol inhaler which is ready for pick up by 6:00 today. On 12/29/2020 she was given #60, 0 refills of the Voltaren which she came and picked up.   This was refilled because she lost her medication so the pharmacy called the insurance co. And got an over ride to refill it.  She has called in now saying she is in the hospital and her Voltaren is lost and she needs a refill of this again.   I called and spoke with the pharmacist who called the insurance co to see if they could get a second over ride for the Voltaren which pt claims is lost at the hospital but the insurance co rejected the second over ride request.   So pharmacy unable to fill the Voltaren a second time.

## 2021-01-14 NOTE — Discharge Summary (Signed)
Physician Discharge Summary  Tiffany [name] UJW:[id number] DOB: 01/07/1961 DOA: 01/12/2021  PCP: [name], MD  Admit date: 01/12/2021 Discharge date: 01/14/2021 Admitted From: Home Disposition:  Home Recommendations for Outpatient Follow-up:  Follow ups as below. Please obtain CBC/BMP/Mag at follow up Please follow up on the following pending results: None Home Health: PT/OT/RN/[name] Equipment/Devices: None Discharge Condition: Stable CODE STATUS: Full code  Follow-up Information     [name], MD. Schedule an appointment as soon as possible for a visit in 1 week(s).   Specialty: Family Medicine Contact information: [address] [place] [address] [phone]                Hospital Course: 60 year old F with PMH of diastolic CHF, EXB-2W, HTN, HLD, COPD, GERD, anxiety, depression, PTSD, BPD and paranoid schizophrenia brought to [facility] by [facility] with concern for suicidal attempt, and transferred to ED due to chest pain per recommendation by psychiatry.  She was previously seen in ED and evaluated by psychiatry multiple times.  She was admitted for chest pain work-up although her chest discomfort resolved after a dose of Geodon 20 mg IM x1.  EKG without acute ischemic finding.  Troponin slightly elevated to 19 and trended up to 28 but remained stable at 27.     Patient has not had further cardiopulmonary symptoms.  No significant finding on TTE.  She had mild hypokalemia that has been replenished.  [name] medically cleared for inpatient psychiatric hospitalization if still indicated. Patient had an episode of agitation and anxiety the morning of 01/13/2021 requiring as needed Ativan and Haldol injection.  Her agitation resolved.  Psychiatry was consulted and recommended resuming home Saphris, Lexapro and diazepam.  Per psychiatry, no indication for inpatient psychiatric hospitalization.  Therapy recommended home health.  [name] discharged with home health  PT/OT/RN/[name] if TOC able to secure those services.  See individual problem list below for more on hospital course.  Discharge Diagnoses:  Atypical chest pain: Likely panic attack.  ACS work-up unrevealing.  TTE reassuring.  Chest pain resolved while in ED prior to admission.  Remain free of cardiopulmonary symptoms. -Manage anxiety and other underlying psych issues as below.   Anxiety/depression/PTSD/paranoid schizophrenia/agitation/suicidal ideation -Appreciate recommendation by psych             -No indication for inpatient psych hospitalization.             -Restart home Saphris, Lexapro and Valium-sent new Rx to [facility] outpatient pharmacy.             -Outpatient follow-up with a psychiatrist and therapist as previously planned -Renewed patient's Rx   Chronic COPD: Stable. -Continue home Trelegy Ellipta and as needed albuterol-send new Rx to [facility] outpatient pharmacy.   Essential hypertension: BP slightly elevated. -Increased home amlodipine to 10 mg daily -Decreased home Lasix to 40 mg daily given renal function.   Chronic diastolic CHF: Stable.  Appears euvolemic on exam.  TTE reassuring. -Decreased home Lasix to 40 mg daily and p.o. KCl to 20 mg daily.  Patient to start in 3 days.   CKD-3B: Cr seems to be at baseline. Recent Labs    08/15/20 0804 08/16/20 0431 11/16/20 0246 12/16/20 1326 12/26/20 0549 01/05/21 2129 01/08/21 1311 01/12/21 1827 01/13/21 0238 01/14/21 0516  BUN 13 15 10 17 13 16 13 15 18 22   CREATININE 1.28* 1.21* 1.54* 1.76* 1.60* 1.67* 1.45* 1.49* 1.44* 1.60*  -Decreased home Lasix from 80 to 40 mg daily -Decreased p.o.  KCl to 20 mEq daily.  Normocytic anemia: H&H stable.  Anemia panel consistent with anemia of chronic disease. Recent Labs    08/15/20 0804 08/16/20 1255 11/16/20 0246 12/16/20 1326 12/26/20 0549 01/05/21 2129 01/08/21 1311 01/12/21 1827 01/13/21 0238 01/14/21 0516  HGB 8.7* 8.7* 9.0* 10.3* 10.8* 10.3* 11.6* 11.9* 10.3* 9.9*   -Continue p.o. ferrous sulfate, B12 and multivitamin     Mildly elevated troponin: Likely demand ischemia.  ACS work-up negative. -No further cardiac work-up indicated   Hypokalemia: Resolved.   Bilateral feet pain: Has history of neuropathy and gout.  No signs of gout flareup on exam -Resume home gabapentin, Uloric and colchicine-renewed his prescription   GERD -Continue home Prilosec.  Body mass index is 26.2 kg/m.         Discharge Exam: Vitals:   01/14/21 0600 01/14/21 0752 01/14/21 0937 01/14/21 1310  BP: (!) 154/104  (!) 140/93 (!) 155/99  Pulse: 93  96 97  Temp: 98.8 F (37.1 C)  98 F (36.7 C) 98 F (36.7 C)  Resp: 20  (!) 22 (!) 23  Height:      Weight:      SpO2: 99% 99% 98% 97%  TempSrc: Oral  Oral Oral  BMI (Calculated):         GENERAL: No apparent distress.  Nontoxic. HEENT: MMM.  Vision and hearing grossly intact.  NECK: Supple.  No apparent JVD.  RESP: 97% on RA.  No IWOB.  Fair aeration bilaterally. CVS:  RRR. Heart sounds normal.  ABD/GI/GU: Bowel sounds present. Soft. Non tender.  MSK/EXT:  Moves extremities. No apparent deformity. No edema.  SKIN: no apparent skin lesion or wound NEURO: Awake and alert.  Oriented appropriately.  No apparent focal neuro deficit. PSYCH: Calm. Normal affect.   Discharge Instructions  Discharge Instructions     Call MD for:  difficulty breathing, headache or visual disturbances   Complete by: As directed    Diet - low sodium heart healthy   Complete by: As directed    Discharge instructions   Complete by: As directed    It has been a pleasure taking care of you!  You were hospitalized due to chest pain likely from anxiety.  We have renewed your prescription for anxiety and other mental illness.  Please follow-up with your psychiatrist and therapist as previously planned or sooner if needed.  Review your new medication list and the directions on your medications.  Call 911 or crisis line if you feel unsafe  or you have suicidal thoughts   Take care,   Increase activity slowly   Complete by: As directed       Allergies as of 01/14/2021       Reactions   Hydrocodone Shortness Of Breath   Hydroxyzine Anaphylaxis, Shortness Of Breath   Latuda [lurasidone Hcl] Anaphylaxis   Magnesium-containing Compounds Anaphylaxis   Tolerated Ensure   Prednisone Anaphylaxis, Swelling, Other (See Comments)   Tongue swelling, lip swelling, throat swelling, per pt    Tramadol Anaphylaxis, Swelling   Codeine Nausea And Vomiting   Trazodone Other (See Comments)   paranoia   Topamax [topiramate] Other (See Comments)   Increases paranoia   Sulfa Antibiotics Itching   Tape Rash        Medication List     STOP taking these medications    lisdexamfetamine 20 MG capsule Commonly known as: VYVANSE       TAKE these medications    albuterol (2.5 MG/3ML) 0.083% nebulizer solution  Commonly known as: PROVENTIL Take 3 mLs (2.5 mg total) by nebulization every 6 (six) hours as needed for wheezing or shortness of breath.   albuterol 108 (90 Base) MCG/ACT inhaler Commonly known as: VENTOLIN HFA Inhale 2 puffs into the lungs every 6 (six) hours as needed for wheezing or shortness of breath.   amLODipine 10 MG tablet Commonly known as: NORVASC Take 1 tablet (10 mg total) by mouth daily. Start taking on: January 15, 2021 What changed:  medication strength how much to take   asenapine 5 MG Subl 24 hr tablet Commonly known as: SAPHRIS Place 2 tablets (10 mg total) under the tongue 2 (two) times daily.   atorvastatin 20 MG tablet Commonly known as: LIPITOR Take 1 tablet (20 mg total) by mouth at bedtime.   colchicine 0.6 MG tablet Take 2 tablets (1.2 mg) by mouth at the onset of gout flare, may repeat 1 tablet (0.6 mg) 1 hour if symptoms persist What changed: additional instructions   cyanocobalamin 1000 MCG tablet Take 1 tablet (1,000 mcg total) by mouth daily.   cyclobenzaprine 10 MG  tablet Commonly known as: FLEXERIL Take 1 tablet (10 mg total) by mouth 2 (two) times daily as needed. What changed: reasons to take this   diazepam 5 MG tablet Commonly known as: Valium Take 1 tablet (5 mg total) by mouth every 8 (eight) hours as needed for anxiety. What changed:  how much to take when to take this reasons to take this   diclofenac 75 MG EC tablet Commonly known as: VOLTAREN TAKE 1 TABLET(75 MG) BY MOUTH TWICE DAILY AS NEEDED Strength: 75 mg What changed:  how much to take how to take this when to take this reasons to take this additional instructions   diclofenac Sodium 1 % Gel Commonly known as: Voltaren Apply 4 g topically 4 (four) times daily.   dicyclomine 10 MG capsule Commonly known as: Bentyl Take 1 capsule (10 mg total) by mouth 3 (three) times daily before meals.   escitalopram 10 MG tablet Commonly known as: Lexapro Take 1 tablet (10 mg total) by mouth daily.   febuxostat 40 MG tablet Commonly known as: Uloric Take 1 tablet (40 mg total) by mouth daily. What changed: when to take this   furosemide 40 MG tablet Commonly known as: LASIX Take 1 tablet (40 mg total) by mouth daily. Start taking on: January 17, 2021 What changed:  medication strength how much to take These instructions start on January 17, 2021. If you are unsure what to do until then, ask your doctor or other care provider.   gabapentin 600 MG tablet Commonly known as: NEURONTIN Take 1 tablet (600 mg total) by mouth 2 (two) times daily.   Iron (Ferrous Sulfate) 325 (65 Fe) MG Tabs Take 325 mg by mouth daily.   ketoconazole 2 % cream Commonly known as: NIZORAL Apply 1 application topically daily. Apply to both feet   lidocaine 5 % Commonly known as: Lidoderm Place 1 patch onto the skin daily. Remove & Discard patch within 12 hours or as directed by MD   montelukast 10 MG tablet Commonly known as: SINGULAIR Take 1 tablet (10 mg total) by mouth at bedtime.    multivitamin with minerals Tabs tablet Take 1 tablet by mouth daily.   omeprazole 40 MG capsule Commonly known as: PRILOSEC TAKE 1 CAPSULE(40 MG) BY MOUTH DAILY What changed: See the new instructions.   oxybutynin 5 MG tablet Commonly known as: DITROPAN TAKE 1 TABLET(5  MG) BY MOUTH TWICE DAILY What changed: See the new instructions.   potassium chloride SA 20 MEQ tablet Commonly known as: KLOR-CON M Take 1 tablet (20 mEq total) by mouth daily. Start taking on: January 17, 2021 What changed:  how much to take when to take this These instructions start on January 17, 2021. If you are unsure what to do until then, ask your doctor or other care provider.   prazosin 2 MG capsule Commonly known as: MINIPRESS Take 1 capsule (2 mg total) by mouth at bedtime.   sodium chloride 0.65 % Soln nasal spray Commonly known as: OCEAN Place 2 sprays into both nostrils daily.   terbinafine 1 % cream Commonly known as: LamISIL AT Apply 1 application topically 2 (two) times daily. What changed: when to take this   thiamine 100 MG tablet Take 1 tablet (100 mg total) by mouth daily.   Trelegy Ellipta 100-62.5-25 MCG/ACT Aepb Generic drug: Fluticasone-Umeclidin-Vilant Inhale 1 puff into the lungs daily.        Consultations: Psychiatry  Procedures/Studies:   DG Chest 1 View  Result Date: 01/12/2021 CLINICAL DATA:  Short of breath and chest pain EXAM: CHEST  1 VIEW COMPARISON:  01/08/2021 FINDINGS: The heart size and mediastinal contours are within normal limits. Both lungs are clear. The visualized skeletal structures are unremarkable. IMPRESSION: No active disease. Electronically Signed   By: Randa Ngo M.D.   On: 01/12/2021 20:09   DG Chest 2 View  Result Date: 01/08/2021 CLINICAL DATA:  Chest pain and shortness of breath EXAM: CHEST - 2 VIEW COMPARISON:  12/26/2020 FINDINGS: Stable heart size and vascularity. Clear lungs. Negative for pneumonia, edema, effusion or  pneumothorax. Left rib fractures noted with deformity. Artifact overlies the right upper lobe. Degenerative changes of the spine. Trachea midline. IMPRESSION: Stable exam. No active chest disease or interval change by plain radiography. Electronically Signed   By: Jerilynn Mages.  Shick M.D.   On: 01/08/2021 13:36   DG Chest 2 View  Result Date: 12/26/2020 CLINICAL DATA:  Shortness of breath. EXAM: CHEST - 2 VIEW COMPARISON:  August 08, 2020. FINDINGS: Stable cardiomediastinal silhouette. Both lungs are clear. The visualized skeletal structures are unremarkable. IMPRESSION: No active cardiopulmonary disease. Electronically Signed   By: Marijo Conception M.D.   On: 12/26/2020 06:12   ECHOCARDIOGRAM COMPLETE  Result Date: 01/13/2021    ECHOCARDIOGRAM REPORT   Patient Name:   AMBERLIN UTKE [facility] Date of Exam: 01/13/2021 Medical Rec #:  [id number]       Height:       62.0 in Accession #:    [id number]      Weight:       115.0 lb Date of Birth:  [birth year]       BSA:          1.511 m Patient Age:    50 years        BP:           147/107 mmHg Patient Gender: F               HR:           86 bpm. Exam Location:  Inpatient Procedure: 2D Echo, Color Doppler and Cardiac Doppler Indications:    R07.9* Chest pain, unspecified  History:        Patient has prior history of Echocardiogram examinations, most                 recent 05/13/2020. COPD; Risk Factors:Hypertension,  Dyslipidemia                 and Diabetes.  Sonographer:    Raquel Sarna Senior RDCS Referring Phys: [id number] Rhetta Mura  Sonographer Comments: Apical window very tender, performed with minimal probe pressure. IMPRESSIONS  1. Left ventricular ejection fraction, by estimation, is 60 to 65%. The left ventricle has normal function. The left ventricle has no regional wall motion abnormalities. Left ventricular diastolic parameters are consistent with Grade I diastolic dysfunction (impaired relaxation).  2. Right ventricular systolic function is normal. The right ventricular size  is normal. There is normal pulmonary artery systolic pressure.  3. Left atrial size was mildly dilated.  4. The mitral valve is normal in structure. No evidence of mitral valve regurgitation. No evidence of mitral stenosis.  5. The aortic valve is tricuspid. Aortic valve regurgitation is not visualized. No aortic stenosis is present.  6. The inferior vena cava is normal in size with greater than 50% respiratory variability, suggesting right atrial pressure of 3 mmHg. FINDINGS  Left Ventricle: Left ventricular ejection fraction, by estimation, is 60 to 65%. The left ventricle has normal function. The left ventricle has no regional wall motion abnormalities. The left ventricular internal cavity size was normal in size. There is  no left ventricular hypertrophy. Left ventricular diastolic parameters are consistent with Grade I diastolic dysfunction (impaired relaxation). Normal left ventricular filling pressure. Right Ventricle: The right ventricular size is normal. No increase in right ventricular wall thickness. Right ventricular systolic function is normal. There is normal pulmonary artery systolic pressure. The tricuspid regurgitant velocity is 1.05 m/s, and  with an assumed right atrial pressure of 3 mmHg, the estimated right ventricular systolic pressure is 7.4 mmHg. Left Atrium: Left atrial size was mildly dilated. Right Atrium: Right atrial size was normal in size. Pericardium: There is no evidence of pericardial effusion. Mitral Valve: The mitral valve is normal in structure. No evidence of mitral valve regurgitation. No evidence of mitral valve stenosis. Tricuspid Valve: The tricuspid valve is normal in structure. Tricuspid valve regurgitation is trivial. No evidence of tricuspid stenosis. Aortic Valve: The aortic valve is tricuspid. Aortic valve regurgitation is not visualized. No aortic stenosis is present. Pulmonic Valve: The pulmonic valve was normal in structure. Pulmonic valve regurgitation is not  visualized. No evidence of pulmonic stenosis. Aorta: The aortic root is normal in size and structure. Venous: The inferior vena cava is normal in size with greater than 50% respiratory variability, suggesting right atrial pressure of 3 mmHg. IAS/Shunts: No atrial level shunt detected by color flow Doppler.  LEFT VENTRICLE PLAX 2D LVIDd:         4.30 cm   Diastology LVIDs:         3.20 cm   LV e' medial:    5.55 cm/s LV PW:         1.20 cm   LV E/e' medial:  7.9 LV IVS:        1.10 cm   LV e' lateral:   6.09 cm/s LVOT diam:     2.00 cm   LV E/e' lateral: 7.2 LV SV:         57 LV SV Index:   38 LVOT Area:     3.14 cm  RIGHT VENTRICLE RV S prime:     14.10 cm/s TAPSE (M-mode): 2.2 cm LEFT ATRIUM             Index        RIGHT ATRIUM  Index LA diam:        3.60 cm 2.38 cm/m   RA Area:     16.50 cm LA Vol (A2C):   52.1 ml 34.48 ml/m  RA Volume:   43.30 ml  28.66 ml/m LA Vol (A4C):   32.4 ml 21.44 ml/m LA Biplane Vol: 41.9 ml 27.73 ml/m  AORTIC VALVE LVOT Vmax:   90.50 cm/s LVOT Vmean:  66.100 cm/s LVOT VTI:    0.181 m  AORTA Ao Root diam: 2.90 cm Ao Asc diam:  3.30 cm MITRAL VALVE               TRICUSPID VALVE MV Area (PHT): 3.34 cm    TR Peak grad:   4.4 mmHg MV Decel Time: 227 msec    TR Vmax:        105.00 cm/s MV E velocity: 44.10 cm/s MV A velocity: 93.60 cm/s  SHUNTS MV E/A ratio:  0.47        Systemic VTI:  0.18 m                            Systemic Diam: 2.00 cm Skeet Latch MD Electronically signed by Skeet Latch MD Signature Date/Time: 01/13/2021/12:21:13 PM    Final    VAS [name] LOWER EXTREMITY VENOUS (DVT) (ONLY [facility] & [facility])  Result Date: 12/26/2020  Lower Venous DVT Study Patient Name:  MELITTA TIGUE [facility]  Date of Exam:   12/26/2020 Medical Rec #: [id number]        Accession #:    [id number] Date of Birth: October 14, 1960        Patient Gender: F Patient Age:   30 years Exam Location:  [facility] Procedure:      VAS [name] LOWER EXTREMITY VENOUS (DVT) Referring Phys: BRITNI HENDERLY  --------------------------------------------------------------------------------  Indications: Bilateral lower extremity pain.  Limitations: Poor ultrasound/tissue interface. Comparison Study: 04/30/2019 negative right lower extremity venous duplex Performing Technologist: Maudry Mayhew MHA, RDMS, RVT, RDCS  Examination Guidelines: A complete evaluation includes B-mode imaging, spectral Doppler, color Doppler, and power Doppler as needed of all accessible portions of each vessel. Bilateral testing is considered an integral part of a complete examination. Limited examinations for reoccurring indications may be performed as noted. The reflux portion of the exam is performed with the patient in reverse Trendelenburg.  +---------+---------------+---------+-----------+----------+--------------+  RIGHT     Compressibility Phasicity Spontaneity Properties Thrombus Aging  +---------+---------------+---------+-----------+----------+--------------+  CFV       Full            Yes       Yes                                    +---------+---------------+---------+-----------+----------+--------------+  SFJ       Full                                                             +---------+---------------+---------+-----------+----------+--------------+  FV Prox   Full                                                             +---------+---------------+---------+-----------+----------+--------------+  FV Mid    Full                                                             +---------+---------------+---------+-----------+----------+--------------+  FV Distal Full                                                             +---------+---------------+---------+-----------+----------+--------------+  PFV       Full                                                             +---------+---------------+---------+-----------+----------+--------------+  POP       Full            Yes       Yes                                     +---------+---------------+---------+-----------+----------+--------------+  PTV       Full                                                             +---------+---------------+---------+-----------+----------+--------------+  PERO      Full                                                             +---------+---------------+---------+-----------+----------+--------------+   +----+---------------+---------+-----------+----------+--------------+  LEFT Compressibility Phasicity Spontaneity Properties Thrombus Aging  +----+---------------+---------+-----------+----------+--------------+  CFV  Full            Yes       Yes                                    +----+---------------+---------+-----------+----------+--------------+     Summary: RIGHT: - There is no evidence of deep vein thrombosis in the lower extremity.  - No cystic structure found in the popliteal fossa.  LEFT: - No evidence of common femoral vein obstruction.  *See table(s) above for measurements and observations. Electronically signed by Harold Barban MD on 12/26/2020 at 7:43:12 PM.    Final        The results of significant diagnostics from this hospitalization (including imaging, microbiology, ancillary and laboratory) are listed below for reference.     Microbiology: Recent Results (from the past 240 hour(s))  Resp Panel by RT-PCR (Flu A&B, Covid) Nasopharyngeal Swab     Status: None   Collection Time: 01/05/21  9:29 PM   Specimen: Nasopharyngeal Swab; Nasopharyngeal(NP) swabs in vial transport medium  Result Value Ref Range Status   SARS Coronavirus 2 by RT PCR NEGATIVE NEGATIVE Final    Comment: (NOTE) SARS-CoV-2 target nucleic acids are NOT DETECTED.  The SARS-CoV-2 RNA is generally detectable in upper respiratory specimens during the acute phase of infection. The lowest concentration of SARS-CoV-2 viral copies this assay can detect is 138 copies/mL. A negative result does not preclude SARS-Cov-2 infection and should not  be used as the sole basis for treatment or other patient management decisions. A negative result may occur with  improper specimen collection/handling, submission of specimen other than nasopharyngeal swab, presence of viral mutation(s) within the areas targeted by this assay, and inadequate number of viral copies(<138 copies/mL). A negative result must be combined with clinical observations, patient history, and epidemiological information. The expected result is Negative.  Fact Sheet for Patients:  EntrepreneurPulse.com.au  Fact Sheet for Healthcare Providers:  IncredibleEmployment.be  This test is no t yet approved or cleared by the [place] FDA and  has been authorized for detection and/or diagnosis of SARS-CoV-2 by FDA under an Emergency Use Authorization (EUA). This EUA will remain  in effect (meaning this test can be used) for the duration of the COVID-19 declaration under Section 564(b)(1) of the Act, 21 U.S.C.section 360bbb-3(b)(1), unless the authorization is terminated  or revoked sooner.       Influenza A by PCR NEGATIVE NEGATIVE Final   Influenza B by PCR NEGATIVE NEGATIVE Final    Comment: (NOTE) The Xpert Xpress SARS-CoV-2/FLU/RSV plus assay is intended as an aid in the diagnosis of influenza from Nasopharyngeal swab specimens and should not be used as a sole basis for treatment. Nasal washings and aspirates are unacceptable for Xpert Xpress SARS-CoV-2/FLU/RSV testing.  Fact Sheet for Patients: EntrepreneurPulse.com.au  Fact Sheet for Healthcare Providers: IncredibleEmployment.be  This test is not yet approved or cleared by the [place] FDA and has been authorized for detection and/or diagnosis of SARS-CoV-2 by FDA under an Emergency Use Authorization (EUA). This EUA will remain in effect (meaning this test can be used) for the duration of the COVID-19 declaration under Section  564(b)(1) of the Act, 21 U.S.C. section 360bbb-3(b)(1), unless the authorization is terminated or revoked.  Performed at [facility], [place] [address] [place], [place] [address]   Resp Panel by RT-PCR (Flu A&B, Covid) Nasopharyngeal Swab     Status: None   Collection Time: 01/13/21  1:04 AM   Specimen: Nasopharyngeal Swab; Nasopharyngeal(NP) swabs in vial transport medium  Result Value Ref Range Status   SARS Coronavirus 2 by RT PCR NEGATIVE NEGATIVE Final    Comment: (NOTE) SARS-CoV-2 target nucleic acids are NOT DETECTED.  The SARS-CoV-2 RNA is generally detectable in upper respiratory specimens during the acute phase of infection. The lowest concentration of SARS-CoV-2 viral copies this assay can detect is 138 copies/mL. A negative result does not preclude SARS-Cov-2 infection and should not be used as the sole basis for treatment or other patient management decisions. A negative result may occur with  improper specimen collection/handling, submission of specimen other than nasopharyngeal swab, presence of viral mutation(s) within the areas targeted by this assay, and inadequate number of viral copies(<138 copies/mL). A negative result must be combined with clinical observations, patient history, and epidemiological information. The expected result is Negative.  Fact Sheet for Patients:  EntrepreneurPulse.com.au  Fact Sheet for Healthcare Providers:  IncredibleEmployment.be  This test is no t yet  approved or cleared by the [place] and  has been authorized for detection and/or diagnosis of SARS-CoV-2 by FDA under an Emergency Use Authorization (EUA). This EUA will remain  in effect (meaning this test can be used) for the duration of the COVID-19 declaration under Section 564(b)(1) of the Act, 21 U.S.C.section 360bbb-3(b)(1), unless the authorization is terminated  or revoked sooner.       Influenza A by PCR  NEGATIVE NEGATIVE Final   Influenza B by PCR NEGATIVE NEGATIVE Final    Comment: (NOTE) The Xpert Xpress SARS-CoV-2/FLU/RSV plus assay is intended as an aid in the diagnosis of influenza from Nasopharyngeal swab specimens and should not be used as a sole basis for treatment. Nasal washings and aspirates are unacceptable for Xpert Xpress SARS-CoV-2/FLU/RSV testing.  Fact Sheet for Patients: EntrepreneurPulse.com.au  Fact Sheet for Healthcare Providers: IncredibleEmployment.be  This test is not yet approved or cleared by the [place] FDA and has been authorized for detection and/or diagnosis of SARS-CoV-2 by FDA under an Emergency Use Authorization (EUA). This EUA will remain in effect (meaning this test can be used) for the duration of the COVID-19 declaration under Section 564(b)(1) of the Act, 21 U.S.C. section 360bbb-3(b)(1), unless the authorization is terminated or revoked.  Performed at [facility], [place] [address] [place], [place] [address]      Labs:  CBC: Recent Labs  Lab 01/08/21 1311 01/12/21 1827 01/13/21 0238 01/14/21 0516  WBC 6.5 6.8 6.3 5.0  NEUTROABS  --  4.5  --   --   HGB 11.6* 11.9* 10.3* 9.9*  HCT 37.4 37.8 31.9* 31.1*  MCV 91.9 89.6 88.6 88.9  PLT 329 347 380 349   BMP &GFR Recent Labs  Lab 01/08/21 1311 01/12/21 1827 01/13/21 0238 01/14/21 0516  NA 141 136 135 134*  K 3.5 2.8* 3.4* 4.1  CL 108 104 104 107  CO2 23 21* 20* 22  GLUCOSE 77 112* 90 94  BUN 13 15 18 22   CREATININE 1.45* 1.49* 1.44* 1.60*  CALCIUM 9.8 9.5 8.9 8.6*  MG  --   --  1.7 1.7  PHOS  --   --   --  4.5   Estimated Creatinine Clearance: 31.4 mL/min (A) (by C-G formula based on SCr of 1.6 mg/dL (H)). Liver & Pancreas: Recent Labs  Lab 01/13/21 0238 01/14/21 0516  AST 23  --   ALT 20  --   ALKPHOS 141*  --   BILITOT 0.6  --   PROT 6.4*  --   ALBUMIN 3.7 3.2*   Recent Labs  Lab 01/13/21 0455   LIPASE 35   No results for input(s): AMMONIA in the last 168 hours. Diabetic: No results for input(s): HGBA1C in the last 72 hours. No results for input(s): GLUCAP in the last 168 hours. Cardiac Enzymes: No results for input(s): CKTOTAL, CKMB, CKMBINDEX, TROPONINI in the last 168 hours. Recent Labs    06/04/20 1140 06/15/20 1636 07/13/20 1310  PROBNP 11,232* 2,313* 201   Coagulation Profile: No results for input(s): INR, PROTIME in the last 168 hours. Thyroid Function Tests: No results for input(s): TSH, T4TOTAL, FREET4, T3FREE, THYROIDAB in the last 72 hours. Lipid Profile: No results for input(s): CHOL, HDL, LDLCALC, TRIG, CHOLHDL, LDLDIRECT in the last 72 hours. Anemia Panel: Recent Labs    01/14/21 0516  VITAMINB12 273  FOLATE 8.9  FERRITIN 70  TIBC 296  IRON 49  RETICCTPCT 2.0   Urine analysis:  Component Value Date/Time   COLORURINE STRAW (A) 11/16/2020 1549   APPEARANCEUR CLEAR 11/16/2020 1549   LABSPEC 1.005 11/16/2020 1549   PHURINE 6.0 11/16/2020 1549   GLUCOSEU NEGATIVE 11/16/2020 1549   HGBUR MODERATE (A) 11/16/2020 1549   BILIRUBINUR NEGATIVE 11/16/2020 1549   BILIRUBINUR NEG 05/21/2014 1155   KETONESUR NEGATIVE 11/16/2020 1549   PROTEINUR NEGATIVE 11/16/2020 1549   UROBILINOGEN 0.2 09/30/2019 1557   NITRITE NEGATIVE 11/16/2020 1549   LEUKOCYTESUR TRACE (A) 11/16/2020 1549   Sepsis Labs: Invalid input(s): PROCALCITONIN, LACTICIDVEN   Time coordinating discharge: 45 minutes  SIGNED:  Mercy Riding, MD  [facility] 01/14/2021, 3:50 PM

## 2021-01-14 NOTE — Telephone Encounter (Signed)
Copied from [place] [phone]. Topic: Quick Communication - Rx Refill/Question >> Jan 14, 2021  9:48 AM Lew[place] Quan A wrote: Medication: diclofenac (VOLTAREN) 75 MG EC tablet,   Has the patient contacted their pharmacy? Yes (Agent: If no, request that the patient contact the pharmacy for the refill. If patient does not wish to contact the pharmacy document the reason why and proceed with request.) (Agent: If yes, when and what did the pharmacy advise?)  Preferred Pharmacy (with phone number or street name): [place], [place]  Phone:  [phone] Fax:  [phone]    Has the patient been seen for an appointment in the last year OR does the patient have an upcoming appointment? Yes.    Agent: Please be advised that RX refills may take up to 3 business days. We ask that you follow-up with your pharmacy.

## 2021-01-14 NOTE — Evaluation (Signed)
Occupational Therapy Evaluation Patient Details Name: Tiffany [name] MRN: [id number] DOB: 28-Apr-1960 Today's Date: 01/14/2021   History of Present Illness Pt is 60 yo female brought to behavioral health by police with concern for suicidal attempt and transferred to ED with chest pain on 01/12/21.  Pt with hx including CHF, CKD, HTN, HLD, COPD, GERD, anxiety, depression, PTSD, bipolar, and paranoid schizophrenia. Pt evaluated by psychiatry   Clinical Impression   Tiffany [name] presents with above medical history. During evaluation she did not complain of any chest pain. She was modified independent with bed mobility and ambulation with RW. She has chronic right foot drop. She reports some difficulty with LB dressing and typically only wears slip on shoes. She says she lives alone and her mother helps her with groceries. She has been ambulating to the bathroom with nursing. She appears to be near her baseline. She reports she can't go back to her apartment due to a heating issue and wants to go to independent living. She would benefit from a caregiver and/or  ALF/ILF for assistance with medication management, groceries, cooking and cleaning. From an ADL standpoint patient is at her baseline and has no acute care OT needs. Recommend [facility] OT to maximize safe home environment and compensatory strategies at discharge.     Recommendations for follow up therapy are one component of a multi-disciplinary discharge planning process, led by the attending physician.  Recommendations may be updated based on patient status, additional functional criteria and insurance authorization.   Follow Up Recommendations  Home health OT    Assistance Recommended at Discharge PRN  Functional Status Assessment  Patient has not had a recent decline in their functional status  Equipment Recommendations  None recommended by OT    Recommendations for Other Services       Precautions / Restrictions  Precautions Precautions: Fall Restrictions Weight Bearing Restrictions: No      Mobility Bed Mobility Overal bed mobility: Modified Independent Bed Mobility: Supine to Sit;Sit to Supine     Supine to sit: Modified independent (Device/Increase time) Sit to supine: Modified independent (Device/Increase time)        Transfers Overall transfer level: Needs assistance Equipment used: Rolling walker (2 wheels) Transfers: Sit to/from Stand Sit to Stand: Supervision           General transfer comment: pt declined min guard - supervision for all ambulation in room. No apparent loss of balance      Balance Overall balance assessment: Needs assistance;History of Falls Sitting-balance support: Single extremity supported Sitting balance-Leahy Scale: Good     Standing balance support: Bilateral upper extremity supported;No upper extremity supported Standing balance-Leahy Scale: Fair Standing balance comment: Could static stand without AD; light RW use to ambulate                           ADL either performed or assessed with clinical judgement   ADL Overall ADL's : At baseline                                       General ADL Comments: Patient is min guard - supervision for ambulation with walker and all standing. Repors not wearing socks and wearing slip on shoes. REports going to bathroom with nursing (as does nursing) min guard for safety. Overall appears to be at her baseline in regards to ADLs.  Vision Baseline Vision/History: 1 Wears glasses Patient Visual Report: No change from baseline       Perception     Praxis      Pertinent Vitals/Pain Pain Assessment: No/denies pain     Hand Dominance Right   Extremity/Trunk Assessment Upper Extremity Assessment Upper Extremity Assessment: Overall WFL for tasks assessed (strength grossly 3+/5  throughout though expect some difficulty following the instructions. Appears functional)    Lower Extremity Assessment Lower Extremity Assessment: Defer to PT evaluation RLE Deficits / Details: ROM WFL; MMT: ankle 1/5 (baseline), knee 4/5, hip 4/5 LLE Deficits / Details: ROM WFL; MMT: 4+/5 throughout   Cervical / Trunk Assessment Cervical / Trunk Assessment: Kyphotic   Communication Communication Communication: No difficulties   Cognition Arousal/Alertness: Awake/alert Behavior During Therapy: WFL for tasks assessed/performed Overall Cognitive Status: History of cognitive impairments - at baseline                                 General Comments: Pt cooperative for therapy but does like to do things on her own and declining min guard     General Comments       Exercises     Shoulder Instructions      Home Living Family/patient expects to be discharged to:: Unsure Living Arrangements: Alone Available Help at Discharge: Personal care attendant;Family (pt reports her mother and aides assist at times) Type of Home: Apartment                       Home Equipment: Conservation officer, nature (2 wheels)   Additional Comments: Pt reports she has an apartment but is not going back there because no heat. Says she wants to go to an ILF facility      Prior Functioning/Environment Prior Level of Function : Independent/Modified Independent             Mobility Comments: Reports ambulates with RW and has hx of falls.  States that she is getting w/c ADLs Comments: Pt reports able to complete ADLs and IADLs but with some difficulty        OT Problem List:        OT Treatment/Interventions:      OT Goals(Current goals can be found in the care plan section) Acute Rehab OT Goals OT Goal Formulation: All assessment and education complete, DC therapy  OT Frequency:     Barriers to D/C:            Co-evaluation              AM-PAC OT "6 Clicks" Daily Activity     Outcome Measure Help from another person eating meals?: None Help from another  person taking care of personal grooming?: None Help from another person toileting, which includes using toliet, bedpan, or urinal?: None Help from another person bathing (including washing, rinsing, drying)?: None Help from another person to put on and taking off regular upper body clothing?: None Help from another person to put on and taking off regular lower body clothing?: A Little 6 Click Score: 23   End of Session Equipment Utilized During Treatment: Rolling walker (2 wheels) Nurse Communication:  (okay to see)  Activity Tolerance: Patient tolerated treatment well Patient left: in bed;with call bell/phone within reach;with bed alarm set  OT Visit Diagnosis: Unsteadiness on feet (R26.81)  Time: 7981-0254 OT Time Calculation (min): 19 min Charges:  OT General Charges $OT Visit: 1 Visit OT Evaluation $OT Eval Low Complexity: 1 Low  Skarlet Lyons, OTR/L [place] [phone] Pager: [place] 01/14/2021, 3:55 PM

## 2021-01-16 DIAGNOSIS — Z87891 Personal history of nicotine dependence: Secondary | ICD-10-CM | POA: Insufficient documentation

## 2021-01-16 DIAGNOSIS — D638 Anemia in other chronic diseases classified elsewhere: Secondary | ICD-10-CM | POA: Insufficient documentation

## 2021-01-16 DIAGNOSIS — F419 Anxiety disorder, unspecified: Secondary | ICD-10-CM | POA: Insufficient documentation

## 2021-01-16 DIAGNOSIS — I2510 Atherosclerotic heart disease of native coronary artery without angina pectoris: Secondary | ICD-10-CM | POA: Insufficient documentation

## 2021-01-17 ENCOUNTER — Other Ambulatory Visit (HOSPITAL_COMMUNITY): Payer: Self-pay

## 2021-01-17 ENCOUNTER — Encounter (HOSPITAL_COMMUNITY): Payer: Self-pay

## 2021-01-17 ENCOUNTER — Other Ambulatory Visit: Payer: Self-pay

## 2021-01-17 ENCOUNTER — Emergency Department (HOSPITAL_COMMUNITY)
Admission: EM | Admit: 2021-01-17 | Discharge: 2021-01-17 | Disposition: A | Discharge status: Home / Self Care | Payer: Medicare Other | Attending: Emergency Medicine | Admitting: Emergency Medicine

## 2021-01-17 DIAGNOSIS — M79604 Pain in right leg: Secondary | ICD-10-CM | POA: Insufficient documentation

## 2021-01-17 DIAGNOSIS — I130 Hypertensive heart and chronic kidney disease with heart failure and stage 1 through stage 4 chronic kidney disease, or unspecified chronic kidney disease: Secondary | ICD-10-CM | POA: Insufficient documentation

## 2021-01-17 DIAGNOSIS — M79605 Pain in left leg: Secondary | ICD-10-CM | POA: Diagnosis not present

## 2021-01-17 DIAGNOSIS — R0789 Other chest pain: Secondary | ICD-10-CM | POA: Insufficient documentation

## 2021-01-17 DIAGNOSIS — Z743 Need for continuous supervision: Secondary | ICD-10-CM | POA: Diagnosis not present

## 2021-01-17 DIAGNOSIS — Z79899 Other long term (current) drug therapy: Secondary | ICD-10-CM | POA: Insufficient documentation

## 2021-01-17 DIAGNOSIS — J449 Chronic obstructive pulmonary disease, unspecified: Secondary | ICD-10-CM | POA: Diagnosis not present

## 2021-01-17 DIAGNOSIS — N189 Chronic kidney disease, unspecified: Secondary | ICD-10-CM | POA: Diagnosis not present

## 2021-01-17 DIAGNOSIS — Z7951 Long term (current) use of inhaled steroids: Secondary | ICD-10-CM | POA: Insufficient documentation

## 2021-01-17 DIAGNOSIS — I509 Heart failure, unspecified: Secondary | ICD-10-CM | POA: Diagnosis not present

## 2021-01-17 NOTE — Discharge Instructions (Signed)
Return to ED with any new or worsening symptoms such as SOB, leg/calf swelling, failure for leg pain to resolve with use of medications Please go and pick up your medication from the [facility] as we discussed Follow up with PCP in the next 3-5 days if leg pain fails to resolve

## 2021-01-17 NOTE — ED Triage Notes (Signed)
Patient arrived via [facility] with complaints of bilateral leg pain, states it feels like her gout.

## 2021-01-17 NOTE — ED Provider Notes (Signed)
[facility] DEPT Provider Note   CSN: [id number] Arrival date & time: 01/17/21  0034     History  Chief Complaint  Patient presents with   Leg Pain    Tiffany [name] is a 61 y.o. female with medical history significant for CHF, CKD, hypertension, COPD, GERD, gout, anxiety, depression, paranoid schizophrenia who presents to ED for complaint of bilateral leg pain.  Patient states that leg pain began yesterday, and has progressively worsened since.  Patient describes pain as "cramping" and has attempted to alleviate symptoms utilizing her colchicine.  Patient denies any alleviation of symptoms after colchicine usage.  Patient was recently discharged from [facility] on 12/30 with diagnosis of atypical chest pain most likely from anxiety attack.  Patient states since discharge on 12/30 she has not taken any of her prescribed home medications.  She states that she has not taken her medications because the [facility] has been closed over the long weekend due to the [employer].  Patient endorses bilateral cramping leg pain.  Patient denies recent fevers, swelling, numbness or tingling, trauma, discoloration of skin. Patient also noted to be hypertensive however patient denies chest pain, shortness of breath, headaches, blurry vision.  Of note, patient believes that the pain is correlated with her gout or arthritis   Leg Pain Associated symptoms: no back pain and no fever       Home Medications Prior to Admission medications   Medication Sig Start Date End Date Taking? Authorizing Provider  albuterol (PROVENTIL) (2.5 MG/3ML) 0.083% nebulizer solution Take 3 mLs (2.5 mg total) by nebulization every 6 (six) hours as needed for wheezing or shortness of breath. 12/16/20   [name], MD  albuterol (VENTOLIN HFA) 108 (90 Base) MCG/ACT inhaler Inhale 2 puffs into the lungs every 6 (six) hours as needed for wheezing or shortness of breath. 01/14/21    Mercy Riding, MD  amLODipine (NORVASC) 10 MG tablet Take 1 tablet (10 mg total) by mouth daily. 01/15/21   Mercy Riding, MD  asenapine (SAPHRIS) 5 MG SUBL 24 hr tablet Place 2 tablets (10 mg total) under the tongue 2 (two) times daily. 01/14/21   Mercy Riding, MD  atorvastatin (LIPITOR) 20 MG tablet Take 1 tablet (20 mg total) by mouth at bedtime. 01/14/21   Mercy Riding, MD  colchicine 0.6 MG tablet Take 2 tablets by mouth at the onset of gout flare, may repeat 1 tablet in 1 hour if symptoms persist 01/14/21   Mercy Riding, MD  cyclobenzaprine (FLEXERIL) 10 MG tablet Take 1 tablet (10 mg total) by mouth 2 (two) times daily as needed. Patient taking differently: Take 10 mg by mouth 2 (two) times daily as needed for muscle spasms. 12/16/20   [name], MD  diazepam (VALIUM) 5 MG tablet Take 1 tablet (5 mg total) by mouth every 8 (eight) hours as needed for anxiety. 01/14/21 02/13/21  Mercy Riding, MD  diclofenac (VOLTAREN) 75 MG EC tablet TAKE 1 TABLET(75 MG) BY MOUTH TWICE DAILY AS NEEDED Strength: 75 mg 01/14/21   [name], MD  diclofenac Sodium (VOLTAREN) 1 % GEL Apply 4 g topically 4 (four) times daily. 12/28/20   [name], MD  dicyclomine (BENTYL) 10 MG capsule Take 1 capsule (10 mg total) by mouth 3 (three) times daily before meals. 12/24/20   [name], MD  escitalopram (LEXAPRO) 10 MG tablet Take 1 tablet (10 mg total) by mouth daily. 01/14/21 02/13/21  Mercy Riding, MD  febuxostat (ULORIC) 40 MG tablet Take 1 tablet (40 mg total) by mouth daily. 01/14/21   Mercy Riding, MD  Fluticasone-Umeclidin-Vilant (TRELEGY ELLIPTA) 100-62.5-25 MCG/ACT AEPB Inhale 1 puff into the lungs daily. 01/14/21   Mercy Riding, MD  furosemide (LASIX) 40 MG tablet Take 1 tablet (40 mg total) by mouth daily. 01/17/21 03/18/21  Mercy Riding, MD  gabapentin (NEURONTIN) 600 MG tablet Take 1 tablet (600 mg total) by mouth 2 (two) times daily. 01/14/21   Mercy Riding, MD  Iron, Ferrous Sulfate,  325 (65 Fe) MG TABS Take 325 mg by mouth daily. 01/14/21   Mercy Riding, MD  ketoconazole (NIZORAL) 2 % cream Apply 1 application topically daily. Apply to both feet 09/27/20   [provider]  lidocaine (LIDODERM) 5 % Place 1 patch onto the skin daily. Remove & Discard patch within 12 hours or as directed by MD 12/26/20   Henderly, Britni A, PA-C  montelukast (SINGULAIR) 10 MG tablet Take 1 tablet (10 mg total) by mouth at bedtime. 01/14/21   Mercy Riding, MD  Multiple Vitamin (MULTIVITAMIN WITH MINERALS) TABS tablet Take 1 tablet by mouth daily. 01/14/21   Mercy Riding, MD  omeprazole (PRILOSEC) 40 MG capsule TAKE 1 CAPSULE(40 MG) BY MOUTH DAILY Patient taking differently: Take 40 mg by mouth daily. 10/23/20   [name], MD  oxybutynin (DITROPAN) 5 MG tablet TAKE 1 TABLET(5 MG) BY MOUTH TWICE DAILY Patient taking differently: Take 5 mg by mouth 2 (two) times daily. 10/23/20   [name], MD  potassium chloride SA (KLOR-CON M) 20 MEQ tablet Take 1 tablet (20 mEq total) by mouth daily. 01/17/21 02/16/21  Mercy Riding, MD  prazosin (MINIPRESS) 2 MG capsule Take 1 capsule (2 mg total) by mouth at bedtime. 01/14/21   Mercy Riding, MD  sodium chloride (OCEAN) 0.65 % SOLN nasal spray Place 2 sprays into both nostrils daily.    [provider]  terbinafine (LAMISIL AT) 1 % cream Apply 1 application topically 2 (two) times daily. Patient taking differently: Apply 1 application topically 3 (three) times daily. 09/07/20   [name], MD  thiamine 100 MG tablet Take 1 tablet (100 mg total) by mouth daily. 02/27/20   Hongalgi, Lenis Dickinson, MD  cyanocobalamin 1000 MCG tablet Take 1 tablet (1,000 mcg total) by mouth daily. 01/14/21   Mercy Riding, MD      Allergies    Hydrocodone, Hydroxyzine, Anette Guarneri [lurasidone hcl], Magnesium-containing compounds, Prednisone, Tramadol, Codeine, Trazodone, Topamax [topiramate], Sulfa antibiotics, and Tape    Review of Systems   Review of Systems   Constitutional:  Negative for chills and fever.  Eyes:  Negative for visual disturbance.  Respiratory:  Negative for shortness of breath.   Cardiovascular:  Negative for chest pain.  Musculoskeletal:  Positive for arthralgias and myalgias. Negative for back pain and joint swelling.       Bilateral thigh pain  Skin:  Negative for color change.  Neurological:  Negative for headaches.  All other systems reviewed and are negative.  Physical Exam Updated Vital Signs BP (!) 171/111 (BP Location: Left Arm)    Pulse 71    Temp 98.2 F (36.8 C) (Oral)    Resp 18    Ht 5\' 1"  (1.549 m)    Wt 61.2 kg    SpO2 96%    BMI 25.51 kg/m  Physical Exam Vitals and nursing note reviewed.  Constitutional:  General: [name] not in acute distress.    Appearance: [name] not ill-appearing, toxic-appearing or diaphoretic.  HENT:     Head: Normocephalic and atraumatic.     Nose: Nose normal.     Mouth/Throat:     Mouth: Mucous membranes are moist.  Eyes:     Extraocular Movements: Extraocular movements intact.     Pupils: Pupils are equal, round, and reactive to light.  Cardiovascular:     Rate and Rhythm: Normal rate and regular rhythm.  Pulmonary:     Effort: Pulmonary effort is normal.     Breath sounds: Normal breath sounds. No wheezing.  Abdominal:     General: Abdomen is flat. There is no distension.     Palpations: Abdomen is soft.     Tenderness: There is no abdominal tenderness. There is no guarding.  Musculoskeletal:        General: No swelling, deformity or signs of injury. Normal range of motion.     Cervical back: Normal range of motion and neck supple.     Right upper leg: Tenderness present. No swelling or deformity.     Left upper leg: Tenderness present. No swelling or deformity.     Right lower leg: No swelling, deformity or tenderness. No edema.     Left lower leg: No swelling, deformity or tenderness. No edema.     Right ankle: No swelling. No tenderness.     Left ankle: No  swelling. No tenderness.     Comments: Bilaterally, patient's legs are tender to palpation of upper thigh.  Patient states pain begins in bilateral hips and then travels down into her thighs.  Patient knee and ankle joints not noted to be warm, hot to touch, swollen.  No clinical signs of gout on inspection or evaluation.  Patient has appropriate range of motion to hip, knee, ankle joints.  Skin:    General: Skin is warm and dry.     Capillary Refill: Capillary refill takes less than 2 seconds.  Neurological:     General: No focal deficit present.     Mental Status: [name] alert and oriented to person, place, and time.  Psychiatric:        Mood and Affect: Mood normal.    ED Results / Procedures / Treatments   Labs (all labs ordered are listed, but only abnormal results are displayed) Labs Reviewed - No data to display  EKG None  Radiology No results found.  Procedures Procedures    Medications Ordered in ED Medications - No data to display  ED Course/ Medical Decision Making/ A&P                           Medical Decision Making   61 year old female presents with bilateral upper leg pain.  Patient states that the pain began yesterday and has progressively worsened.  Patient denies any recent trauma, falls.  Patient believes that this pain is due to her gout or her arthritis.  Of note, patient recently discharged from this healthcare facility on 12/30 with diagnosis of atypical chest pain due to panic attack.  Patient states that since being discharged on 12/30 she has not been able to take any of her home medications due to the [facility] being closed due to holiday.  When I made contact with this patient and interviewed her, I advised her that we may wish to proceed with further work-up to include x-ray imaging  of joints.  The patient stated that since she was discharged from hospital on 12/30 and has not been able to take any of her medication, she believes that not  taking her medication is to blame for her current symptoms of crampy bilateral upper leg pain.  The patient stated that "I think if I just take my home meds my pain will go away".  The patient and I had a shared decision-making conversation and I advised her of the possible diagnoses she may have however patient still wished to be discharged.  I discussed the patient and her case with Dr. Jeanell Sparrow who is in agreement with the plan to discharge and for the patient to go pick up her home medication and begin taking them.  I gave the patient return precautions and advised her that if the pain failed to subside that she should return back to the ED for further evaluation and work-up.  I also encouraged this patient to follow-up with her PCP in the next 3 to 5 days if the leg pain were to fail to resolve.  The patient expressed full understanding of my instructions and she was in agreement with the plan to discharge and receive outpatient medications.  The patient was stable on discharge.  Final Clinical Impression(s) / ED Diagnoses Final diagnoses:  None    Rx / DC Orders ED Discharge Orders     None         Azucena Cecil, PA-C 01/17/21 9244    Pattricia Boss, MD 01/19/21 1428

## 2021-01-18 ENCOUNTER — Telehealth: Payer: Self-pay

## 2021-01-18 NOTE — Telephone Encounter (Signed)
Transition Care Management Follow-up Telephone Call Date of discharge and from where: 01/14/2021, [facility] then seen in  [place] ED 01/17/2021.  How have you been since you were released from the hospital? She did not want to confirm her last name or birthday but eventually did. She said she did not want to talk about her hospital visit. She said she did not want to talk to this CM because this CM tried to placed her in an institution.  I  attempted to explain to her that I am  trying to assist her and did not try to have her placed in an institution. She was upset that she was " served papers" and was taken to the hospital because of what she said.  I attempted again to explain that I am  trying to help her and did not try to placed her in an institution. I am aware that she has refused ALF in the past. She said she did not want to talk to this CM or the SW and would only talk to Dr Margarita Rana and then she hung up.

## 2021-01-18 NOTE — Telephone Encounter (Signed)
TOC call placed to patient today and she refused to speak with this CM and hung up.  This is noted in a TOC telephone call.

## 2021-01-19 ENCOUNTER — Encounter: Payer: Self-pay | Admitting: Nurse Practitioner

## 2021-01-19 ENCOUNTER — Other Ambulatory Visit: Payer: Self-pay

## 2021-01-19 ENCOUNTER — Ambulatory Visit: Payer: Commercial Managed Care - HMO | Attending: Nurse Practitioner | Admitting: Nurse Practitioner

## 2021-01-19 ENCOUNTER — Other Ambulatory Visit (HOSPITAL_COMMUNITY): Payer: Self-pay

## 2021-01-19 VITALS — BP 153/100 | HR 86 | Ht 61.0 in | Wt 135.0 lb

## 2021-01-19 DIAGNOSIS — Z09 Encounter for follow-up examination after completed treatment for conditions other than malignant neoplasm: Secondary | ICD-10-CM | POA: Diagnosis not present

## 2021-01-19 DIAGNOSIS — Z8739 Personal history of other diseases of the musculoskeletal system and connective tissue: Secondary | ICD-10-CM | POA: Diagnosis not present

## 2021-01-19 DIAGNOSIS — G894 Chronic pain syndrome: Secondary | ICD-10-CM

## 2021-01-19 DIAGNOSIS — Z1231 Encounter for screening mammogram for malignant neoplasm of breast: Secondary | ICD-10-CM

## 2021-01-19 DIAGNOSIS — R531 Weakness: Secondary | ICD-10-CM | POA: Diagnosis not present

## 2021-01-19 DIAGNOSIS — R296 Repeated falls: Secondary | ICD-10-CM | POA: Diagnosis not present

## 2021-01-19 DIAGNOSIS — Z7409 Other reduced mobility: Secondary | ICD-10-CM | POA: Diagnosis not present

## 2021-01-19 DIAGNOSIS — R2689 Other abnormalities of gait and mobility: Secondary | ICD-10-CM | POA: Diagnosis not present

## 2021-01-19 NOTE — Progress Notes (Signed)
Assessment & Plan:  Tiffany [name] was seen today for hospitalization follow-up.  Diagnoses and all orders for this visit:  Hospital discharge follow-up  History of gout -     Uric Acid  Breast cancer screening by mammogram -     MM DIGITAL SCREENING BILATERAL; Future  Chronic pain syndrome -     Ambulatory referral to Pain Clinic    Patient has been counseled on age-appropriate routine health concerns for screening and prevention. These are reviewed and up-to-date. Referrals have been placed accordingly. Immunizations are up-to-date or declined.    Subjective:   Chief Complaint  Patient presents with   Hospitalization Follow-up   HPI Tiffany [name] 61 y.o. female presents to office today for hospital follow up. She has numerous chronic medical conditions including hypertension, CHF, COPD, anxiety, depression, paranoid schizophrenia (schizoaffective disorder) dyslipidemia, stage III chronic kidney disease   HFU Seen in the ER on 01-17-2021 with complaints of bilateral leg pain unrelieved with colchicine. Prior to this ED visit she had been discharged on 12-30 with atypical chest pain likely related to anxiety attack. Upon evaluation on 01-17-2021 the ED physician felt it reasonable that she should have further work up to include xray/imaging of her BLE. Patient refused and requested to be discharged so she could go pick up her medications from the pharmacy and begin taking them for her leg pain. Today she reports her leg pain has not improved. [name] demanding more colchicine and is adamant that she has gout. She also states taking the 600 mg of gabapentin is a waste  of her time. She requests her vailum be refilled which I have denied. She will need to request this from the prescribing provider. She also requests a letter for her apartment manager stating she requires accessible entry to her apartment as she is using a wheelchair. I was able to give her a letter for this today.  [name] also  complaining that the pharmacies will not give her any of her medications she is needing. I called 2 pharmacies and both holds on her medications (albuterol and diclofenac) were due to request of early fills that the insurance was denying. I instructed her that she needs to contact the pharmacies prior to going to pick up her medications as she complains to me that she keeps going up to the pharmacies and they won't give her any medication.    Most of the visit today was spent with the patient talking over me, complaining that no one listens to her, she needs her colchcine (which was sent on 12-30 and she also was sent uloric on 12-30) for her gout and that the pharmacies are all giving her the run around.  Doppler study negative for DVT 01-05-2021   Review of Systems  Constitutional:  Negative for fever, malaise/fatigue and weight loss.  HENT: Negative.  Negative for nosebleeds.   Eyes: Negative.  Negative for blurred vision, double vision and photophobia.  Respiratory: Negative.  Negative for cough and shortness of breath.   Cardiovascular: Negative.  Negative for chest pain, palpitations and leg swelling.  Gastrointestinal: Negative.  Negative for heartburn, nausea and vomiting.  Musculoskeletal:  Positive for joint pain and myalgias.  Neurological: Negative.  Negative for dizziness, focal weakness, seizures and headaches.  Psychiatric/Behavioral: Negative.  Negative for suicidal ideas.    Past Medical History:  Diagnosis Date   Agitation 11/22/2017   Anoxic brain injury ([place]) 09/08/2016   C. Arrest due to respiratory failure and  COPD exacerbation   Anxiety    Arthritis    "all over" (04/10/2016)   Asthma 10/18/2010   Binge eating disorder    Cardiac arrest ([place]) 09/08/2016   PEA   Carotid artery stenosis    1-39% bilateral by dopplers 11/2016   Chronic diastolic (congestive) heart failure (HCC)    Chronic kidney disease, stage 3 (HCC)    Chronic pain syndrome 06/18/2012   Chronic  post-traumatic stress disorder (PTSD) 05/27/2018   Chronic respiratory failure with hypoxia and hypercapnia ([place]) 06/22/2015   TRILOGY Vent >AVAPA-ES., Vt target 200-400, Max P 30 , PS max 20 , PS min 6-10 , E Max 6, E Min 4, Rate Auto AVAPS Rate 2 (titrate for pt comfort) , bleed O2 at 5l/m continuous flow .    Closed displaced fracture of fifth metacarpal bone 03/21/2018   Cocaine use disorder, severe, in sustained remission ([place]) 01/60/1093   Complication of anesthesia    decreased bp, decreased heart rate   COPD (chronic obstructive pulmonary disease) ([place]) 07/08/2014   Depression    Diabetic neuropathy ([place]) 04/24/2011   Difficulty with speech 01/24/2018   Disorder of nervous system    Drug abuse ([place]) 11/21/2017   Dyslipidemia 04/24/2011   Elevated troponin 04/28/2012   Emphysema    Encephalopathy 11/21/2017   Essential hypertension 03/22/2016   Fibula fracture 07/10/2016   Frequent falls 10/11/2017   GERD (gastroesophageal reflux disease)    Gout 04/11/2017   Heart attack ([place]) 1980s   History of blood transfusion 1994   "couldn't stop bleeding from my period"   History of drug abuse in remission ([place]) 11/28/2015   Quit in 2017   Hyperlipidemia LDL goal <70    Incontinence    Manic depression ([place])    Morbid obesity ([place]) 10/18/2010   Obstructive sleep apnea 10/18/2010   On home oxygen therapy    "6L; 24/7" (04/10/2016)   OSA on CPAP    "wear mask sometimes" (04/10/2016)   Paranoid ([place])    "sometimes; I'm on RX for it" (04/10/2016)   Prolonged Q-T interval on ECG    Rectal bleeding 12/31/2015   Schizoaffective disorder, bipolar type ([place]) 04/05/2018   Seasonal allergies    Seborrheic keratoses 12/31/2013   Seizures ([place])    "don't know what kind; last one was ~ 1 yr ago" (04/10/2016)   Sinus bradycardia    Stroke ([place]) 1980s   denies residual on 04/10/2016   Thrush 09/19/2013   Type 2 diabetes mellitus ([place]) 10/18/2010    Past Surgical History:  Procedure  Laterality Date   CESAREAN SECTION  1997   HERNIA REPAIR     IR CHOLANGIOGRAM EXISTING TUBE  07/20/2016   IR PERC CHOLECYSTOSTOMY  05/10/2016   IR RADIOLOGIST EVAL & MGMT  06/08/2016   IR RADIOLOGIST EVAL & MGMT  06/29/2016   IR SINUS/FIST TUBE CHK-NON GI  07/12/2016   RIGHT/LEFT HEART CATH AND CORONARY ANGIOGRAPHY N/A 06/19/2017   Procedure: RIGHT/LEFT HEART CATH AND CORONARY ANGIOGRAPHY;  Surgeon: Jolaine Artist, MD;  Location: [facility];  Service: Cardiovascular;  Laterality: N/A;   TIBIA IM NAIL INSERTION Right 07/12/2016   Procedure: INTRAMEDULLARY (IM) NAIL RIGHT TIBIA;  Surgeon: Leandrew Koyanagi, MD;  Location: [facility];  Service: Orthopedics;  Laterality: Right;   UMBILICAL HERNIA REPAIR  ~ 1963   "that's why I don't have a belly button"   VAGINAL HYSTERECTOMY      Family History  Problem Relation Age of Onset  Cancer Father        prostate   Cancer Mother        lung   Depression Mother    Depression Sister    Anxiety disorder Sister    Schizophrenia Sister    Bipolar disorder Sister    Depression Sister    Depression Brother    Heart failure Other        cousin    Social History Reviewed with no changes to be made today.   Outpatient Medications Prior to Visit  Medication Sig Dispense Refill   albuterol (PROVENTIL) (2.5 MG/3ML) 0.083% nebulizer solution Take 3 mLs (2.5 mg total) by nebulization every 6 (six) hours as needed for wheezing or shortness of breath. 360 mL 5   albuterol (VENTOLIN HFA) 108 (90 Base) MCG/ACT inhaler Inhale 2 puffs into the lungs every 6 (six) hours as needed for wheezing or shortness of breath. 18 g 0   amLODipine (NORVASC) 10 MG tablet Take 1 tablet (10 mg total) by mouth daily. 30 tablet 1   asenapine (SAPHRIS) 5 MG SUBL 24 hr tablet Place 2 tablets (10 mg total) under the tongue 2 (two) times daily. 60 tablet 0   atorvastatin (LIPITOR) 20 MG tablet Take 1 tablet (20 mg total) by mouth at bedtime. 30 tablet 1   colchicine 0.6 MG tablet  Take 2 tablets by mouth at the onset of gout flare, may repeat 1 tablet in 1 hour if symptoms persist 15 tablet 0   cyanocobalamin 1000 MCG tablet Take 1 tablet (1,000 mcg total) by mouth daily. 90 tablet 1   cyclobenzaprine (FLEXERIL) 10 MG tablet Take 1 tablet (10 mg total) by mouth 2 (two) times daily as needed. (Patient taking differently: Take 10 mg by mouth 2 (two) times daily as needed for muscle spasms.) 60 tablet 6   diazepam (VALIUM) 5 MG tablet Take 1 tablet (5 mg total) by mouth every 8 (eight) hours as needed for anxiety. 8 tablet 1   diclofenac (VOLTAREN) 75 MG EC tablet TAKE 1 TABLET(75 MG) BY MOUTH TWICE DAILY AS NEEDED Strength: 75 mg 60 tablet 0   diclofenac Sodium (VOLTAREN) 1 % GEL Apply 4 g topically 4 (four) times daily. 100 g 0   dicyclomine (BENTYL) 10 MG capsule Take 1 capsule (10 mg total) by mouth 3 (three) times daily before meals. 90 capsule 6   escitalopram (LEXAPRO) 10 MG tablet Take 1 tablet (10 mg total) by mouth daily. 30 tablet 0   febuxostat (ULORIC) 40 MG tablet Take 1 tablet (40 mg total) by mouth daily. 30 tablet 1   Fluticasone-Umeclidin-Vilant (TRELEGY ELLIPTA) 100-62.5-25 MCG/ACT AEPB Inhale 1 puff into the lungs daily. 60 each 6   furosemide (LASIX) 40 MG tablet Take 1 tablet (40 mg total) by mouth daily. 30 tablet 1   gabapentin (NEURONTIN) 600 MG tablet Take 1 tablet (600 mg total) by mouth 2 (two) times daily. 60 tablet 1   Iron, Ferrous Sulfate, 325 (65 Fe) MG TABS Take 325 mg by mouth daily. 60 tablet 2   ketoconazole (NIZORAL) 2 % cream Apply 1 application topically daily. Apply to both feet     lidocaine (LIDODERM) 5 % Place 1 patch onto the skin daily. Remove & Discard patch within 12 hours or as directed by MD 30 patch 1   montelukast (SINGULAIR) 10 MG tablet Take 1 tablet (10 mg total) by mouth at bedtime. 30 tablet 6   Multiple Vitamin (MULTIVITAMIN WITH MINERALS) TABS tablet Take  1 tablet by mouth daily.     omeprazole (PRILOSEC) 40 MG capsule  TAKE 1 CAPSULE(40 MG) BY MOUTH DAILY (Patient taking differently: Take 40 mg by mouth daily.) 30 capsule 5   oxybutynin (DITROPAN) 5 MG tablet TAKE 1 TABLET(5 MG) BY MOUTH TWICE DAILY (Patient taking differently: Take 5 mg by mouth 2 (two) times daily.) 60 tablet 5   potassium chloride SA (KLOR-CON M) 20 MEQ tablet Take 1 tablet (20 mEq total) by mouth daily. 30 tablet 0   prazosin (MINIPRESS) 2 MG capsule Take 1 capsule (2 mg total) by mouth at bedtime. 30 capsule 0   sodium chloride (OCEAN) 0.65 % SOLN nasal spray Place 2 sprays into both nostrils daily.     terbinafine (LAMISIL AT) 1 % cream Apply 1 application topically 2 (two) times daily. (Patient taking differently: Apply 1 application topically 3 (three) times daily.) 30 g 1   thiamine 100 MG tablet Take 1 tablet (100 mg total) by mouth daily.     No facility-administered medications prior to visit.    Allergies  Allergen Reactions   Hydrocodone Shortness Of Breath   Hydroxyzine Anaphylaxis and Shortness Of Breath   Latuda [Lurasidone Hcl] Anaphylaxis   Magnesium-Containing Compounds Anaphylaxis    Tolerated Ensure   Prednisone Anaphylaxis, Swelling and Other (See Comments)    Tongue swelling, lip swelling, throat swelling, per pt    Tramadol Anaphylaxis and Swelling   Codeine Nausea And Vomiting   Trazodone Other (See Comments)    paranoia   Topamax [Topiramate] Other (See Comments)    Increases paranoia   Sulfa Antibiotics Itching   Tape Rash       Objective:    BP (!) 153/100    Pulse 86    Ht 5\' 1"  (1.549 m)    Wt 135 lb (61.2 kg)    SpO2 100%    BMI 25.51 kg/m  Wt Readings from Last 3 Encounters:  01/19/21 135 lb (61.2 kg)  01/17/21 135 lb (61.2 kg)  01/13/21 138 lb 10.7 oz (62.9 kg)    Physical Exam Vitals and nursing note reviewed.  Constitutional:      Appearance: [name] well-developed.  HENT:     Head: Normocephalic and atraumatic.  Cardiovascular:     Rate and Rhythm: Normal rate and regular rhythm.      Heart sounds: Normal heart sounds. No murmur heard.   No friction rub. No gallop.  Pulmonary:     Effort: Pulmonary effort is normal. No tachypnea or respiratory distress.     Breath sounds: Normal breath sounds. No decreased breath sounds, wheezing, rhonchi or rales.  Chest:     Chest wall: No tenderness.  Abdominal:     General: Bowel sounds are normal.     Palpations: Abdomen is soft.  Musculoskeletal:        General: Normal range of motion.     Cervical back: Normal range of motion.  Skin:    General: Skin is warm and dry.  Neurological:     Mental Status: [name] alert and oriented to person, place, and time.     Coordination: Coordination normal.     Gait: Gait abnormal (using wheelchair for mobility today).  Psychiatric:        Behavior: Behavior normal. Behavior is cooperative.        Thought Content: Thought content normal.        Judgment: Judgment normal.         Patient has been  counseled extensively about nutrition and exercise as well as the importance of adherence with medications and regular follow-up. The patient was given clear instructions to go to ER or return to medical center if symptoms don't improve, worsen or new problems develop. The patient verbalized understanding.   Follow-up: Return for PCP in 3 months.   Gildardo Pounds, FNP-BC [facility] and [facility], [place]   01/20/2021, 12:15 PM

## 2021-01-20 ENCOUNTER — Telehealth: Payer: Self-pay | Admitting: Physical Medicine and Rehabilitation

## 2021-01-20 ENCOUNTER — Encounter: Payer: Self-pay | Admitting: Nurse Practitioner

## 2021-01-20 LAB — URIC ACID: Uric Acid: 6.3 mg/dL (ref 3.0–7.2)

## 2021-01-20 NOTE — Telephone Encounter (Signed)
Patient called needing to schedule an appointment for her back. The number to patient is [phone]

## 2021-01-22 ENCOUNTER — Other Ambulatory Visit (HOSPITAL_COMMUNITY): Payer: Self-pay

## 2021-01-22 ENCOUNTER — Other Ambulatory Visit: Payer: Self-pay | Admitting: Family Medicine

## 2021-01-22 NOTE — Telephone Encounter (Signed)
Original request was to send med to [facility]     Requested Prescriptions  Pending Prescriptions Disp Refills   diclofenac (VOLTAREN) 75 MG EC tablet [Pharmacy Med Name: DICLOFENAC SODIUM 75MG  DR TABLETS] 60 tablet 0    Sig: TAKE 1 TABLET(75 MG) BY MOUTH TWICE DAILY AS NEEDED     Analgesics:  NSAIDS Failed - 01/22/2021  5:28 PM      Failed - Cr in normal range and within 360 days    Creat  Date Value Ref Range Status  02/18/2016 1.22 (H) 0.50 - 1.05 mg/dL Final    Comment:      For patients > or = 62 years of age: The upper reference limit for Creatinine is approximately 13% higher for people identified as African-American.      Creatinine, Ser  Date Value Ref Range Status  01/14/2021 1.60 (H) 0.44 - 1.00 mg/dL Final   Creatinine,U  Date Value Ref Range Status  06/17/2012 521.94 mg/dL Final    Comment:    Result confirmed by automatic dilution.    Cutoff Values for Urine Drug Screen, Pain Mgmt          Drug Class           Cutoff (ng/mL)          Amphetamines             500          Barbiturates             200          Cocaine Metabolites      150          Benzodiazepines          200          Methadone                300          Opiates                  300          Phencyclidine             25          Propoxyphene             300          Marijuana Metabolites     50    For medical purposes only.   Creatinine, Urine  Date Value Ref Range Status  02/19/2020 300.84 mg/dL Final    Comment:    Performed at [facility], [place] [address], [place], [place] [address]          Failed - HGB in normal range and within 360 days    Hemoglobin  Date Value Ref Range Status  01/14/2021 9.9 (L) 12.0 - 15.0 g/dL Final  11/25/2019 9.0 (L) 11.1 - 15.9 g/dL Final          Passed - Patient is not pregnant      Passed - Valid encounter within last 12 months    Recent Outpatient Visits           3 days ago Hospital  discharge follow-up   [place] Gildardo Pounds, NP   2 weeks ago Pain in both lower extremities   [place], [place], MD   1 month ago Adnexal cyst   [place], [place], MD   2 months ago Neuropathy   [place], [place]   3 months ago Diarrhea, unspecified type   [place], Enobong, MD       Future Appointments             In 2 weeks Gilford Rile, Martie Lee, NP [facility]   In 2 months [name], MD [place]

## 2021-01-23 NOTE — Telephone Encounter (Deleted)
Attempted to call pharmacy but too many in queue to hold-

## 2021-01-23 NOTE — Telephone Encounter (Signed)
Requested medication (s) are due for refill today: no  Requested medication (s) are on the active medication list: yes  Last refill:  01/14/21 #60  Future visit scheduled: yes  Notes to clinic: overdue lab work   Requested Prescriptions  Pending Prescriptions Disp Refills   diclofenac (VOLTAREN) 75 MG EC tablet [Pharmacy Med Name: DICLOFENAC SODIUM 75MG  DR TABLETS] 60 tablet 0    Sig: TAKE 1 TABLET(75 MG) BY MOUTH TWICE DAILY AS NEEDED     Analgesics:  NSAIDS Failed - 01/22/2021  5:47 PM      Failed - Cr in normal range and within 360 days    Creat  Date Value Ref Range Status  02/18/2016 1.22 (H) 0.50 - 1.05 mg/dL Final    Comment:      For patients > or = 62 years of age: The upper reference limit for Creatinine is approximately 13% higher for people identified as African-American.      Creatinine, Ser  Date Value Ref Range Status  01/14/2021 1.60 (H) 0.44 - 1.00 mg/dL Final   Creatinine,U  Date Value Ref Range Status  06/17/2012 521.94 mg/dL Final    Comment:    Result confirmed by automatic dilution.    Cutoff Values for Urine Drug Screen, Pain Mgmt          Drug Class           Cutoff (ng/mL)          Amphetamines             500          Barbiturates             200          Cocaine Metabolites      150          Benzodiazepines          200          Methadone                300          Opiates                  300          Phencyclidine             25          Propoxyphene             300          Marijuana Metabolites     50    For medical purposes only.   Creatinine, Urine  Date Value Ref Range Status  02/19/2020 300.84 mg/dL Final    Comment:    Performed at [facility], [place] [address] [place], [place] [address]          Failed - HGB in normal range and within 360 days    Hemoglobin  Date Value Ref Range Status  01/14/2021 9.9 (L) 12.0 - 15.0 g/dL Final  11/25/2019 9.0 (L) 11.1 - 15.9 g/dL Final          Passed -  Patient is not pregnant      Passed - Valid encounter within last 12 months    Recent Outpatient Visits           4 days ago Hospital discharge follow-up   [place], NP   2 weeks ago Pain in  both lower extremities   [place], Enobong, MD   1 month ago Adnexal cyst   [place], Enobong, MD   2 months ago Neuropathy   [place], [place]   3 months ago Diarrhea, unspecified type   [place], Enobong, MD       Future Appointments             In 2 weeks Gilford Rile, Martie Lee, NP [facility]   In 2 months [name], MD [place]

## 2021-01-24 ENCOUNTER — Telehealth (HOSPITAL_COMMUNITY): Payer: Self-pay | Admitting: Family Medicine

## 2021-01-24 ENCOUNTER — Other Ambulatory Visit (HOSPITAL_COMMUNITY): Payer: Self-pay

## 2021-01-24 DIAGNOSIS — R062 Wheezing: Secondary | ICD-10-CM | POA: Diagnosis not present

## 2021-01-24 DIAGNOSIS — G4733 Obstructive sleep apnea (adult) (pediatric): Secondary | ICD-10-CM | POA: Diagnosis not present

## 2021-01-24 DIAGNOSIS — J45909 Unspecified asthma, uncomplicated: Secondary | ICD-10-CM | POA: Diagnosis not present

## 2021-01-24 NOTE — BH Assessment (Signed)
Care Management - [place] Follow Up Discharges   Writer attempted to make contact with patient today and was unsuccessful.  Voicemail is not set up.   Per chart review, patient will follow up with her established provider Dr. Curt Bears for medication management .

## 2021-01-25 ENCOUNTER — Ambulatory Visit: Payer: Commercial Managed Care - HMO | Admitting: Physical Medicine and Rehabilitation

## 2021-01-25 ENCOUNTER — Other Ambulatory Visit: Payer: Self-pay

## 2021-01-25 DIAGNOSIS — M79604 Pain in right leg: Secondary | ICD-10-CM | POA: Diagnosis not present

## 2021-01-25 DIAGNOSIS — Z889 Allergy status to unspecified drugs, medicaments and biological substances status: Secondary | ICD-10-CM | POA: Diagnosis not present

## 2021-01-25 DIAGNOSIS — M545 Low back pain, unspecified: Secondary | ICD-10-CM | POA: Diagnosis not present

## 2021-01-25 DIAGNOSIS — M79605 Pain in left leg: Secondary | ICD-10-CM | POA: Diagnosis not present

## 2021-01-26 ENCOUNTER — Other Ambulatory Visit (HOSPITAL_COMMUNITY): Payer: Self-pay

## 2021-01-26 ENCOUNTER — Ambulatory Visit: Payer: Self-pay | Admitting: *Deleted

## 2021-01-26 ENCOUNTER — Telehealth: Payer: Self-pay

## 2021-01-26 ENCOUNTER — Other Ambulatory Visit: Payer: Self-pay

## 2021-01-26 NOTE — Telephone Encounter (Signed)
-----   Message from Gildardo Pounds, NP sent at 01/20/2021 12:17 PM EST ----- Normal uric acid. There is currently no evidence of a gout flare that requires refill of cholchicine

## 2021-01-26 NOTE — Telephone Encounter (Signed)
Patient name and DOB has been verified Patient was informed of lab results. Patient had no questions.  

## 2021-01-26 NOTE — Telephone Encounter (Signed)
Reason for Disposition  Sounds like a life-threatening emergency to the triager  Answer Assessment - Initial Assessment Questions 1. BLOOD PRESSURE: "What is the blood pressure?" "Did you take at least two measurements 5 minutes apart?"     BP 189/112 that's after I took my medicine for BP.    It was 209/140 something before taking my medicine.  I'm feeling swimmy headed and I'm laying down.   I can't even get up to open the door to let the paramedics in.    The way I feel I'm afraid to try and get up.   I started stumbling when I get up.   I came and laid down.   I called to see what I should do.    I let her know she needs to go to the ED.   Call 911.    She agreed to call 911 and call her mother to come open the door but she lives 25 miles away.   She asked what the signs of a stroke were.   I let her know lightheadedness, unable to walk like she is unable to do, stumbling, dizziness, weakness on one side of her body, she broke in and said "I'm feeling that way know".   "Stumbling around and very lightheaded".  "That's why I'm laying down to see if it will help".    I instructed her to call 911 immediately which she finally agreed to do. 2. ONSET: "When did you take your blood pressure?"     A few minutes ago.   3. HOW: "How did you obtain the blood pressure?" (e.g., visiting nurse, automatic home BP monitor)     *No Answer* 4. HISTORY: "Do you have a history of high blood pressure?"     *No Answer* 5. MEDICATIONS: "Are you taking any medications for blood pressure?" "Have you missed any doses recently?"     *No Answer* 6. OTHER SYMPTOMS: "Do you have any symptoms?" (e.g., headache, chest pain, blurred vision, difficulty breathing, weakness)     Lightheaded, stumbling around when trying to walk.   7. PREGNANCY: "Is there any chance you are pregnant?" "When was your last menstrual period?"     *No Answer*  Protocols used: Blood Pressure - High-A-AH I asked her what her street address was  because a PO box is listed on her demographics.  She didn't tell me the number of the street but that the street was [place] and told me not to call the paramedics because she needed to get her mother to come open the door.   "She's the only one who has a key".    She did finally agree to call 911 now after I told her the signs of a stroke and she admitted to having some of those signs.

## 2021-01-26 NOTE — Telephone Encounter (Signed)
See triage notes

## 2021-01-27 ENCOUNTER — Other Ambulatory Visit: Payer: Self-pay

## 2021-01-28 ENCOUNTER — Other Ambulatory Visit: Payer: Self-pay

## 2021-01-28 ENCOUNTER — Ambulatory Visit: Payer: Self-pay

## 2021-01-28 ENCOUNTER — Other Ambulatory Visit (HOSPITAL_COMMUNITY): Payer: Self-pay

## 2021-01-28 NOTE — Telephone Encounter (Signed)
°  Chief Complaint: HTN Symptoms: BP 167/112 Frequency: been high for a while now Pertinent Negatives: Patient denies any chest pain, SOB, headache Disposition: [] ED /[] Urgent Care (no appt availability in office) / [] Appointment(In office/virtual)/ []  [facility]/ [] Home Care/ [] Refused Recommended Disposition /[x] [facility]/ []  Follow-up with PCP Additional Notes: Pt was advised to go to Mobile [facility] on 02/01/21. Pt was given location and time for there as well. Care advice given and pt verbalized understanding. No other questions/concerns noted.     Reason for Disposition  Systolic BP  >= 088 OR Diastolic >= 110  Answer Assessment - Initial Assessment Questions 1. BLOOD PRESSURE: "What is the blood pressure?" "Did you take at least two measurements 5 minutes apart?"     171/170 167/112 today 2. ONSET: "When did you take your blood pressure?"     Yesterday  3. HOW: "How did you obtain the blood pressure?" (e.g., visiting nurse, automatic home BP monitor)     BP cuff 4. HISTORY: "Do you have a history of high blood pressure?"     yes 5. MEDICATIONS: "Are you taking any medications for blood pressure?" "Have you missed any doses recently?"     Yes and no 6. OTHER SYMPTOMS: "Do you have any symptoms?" (e.g., headache, chest pain, blurred vision, difficulty breathing, weakness)     no  Protocols used: Blood Pressure - High-A-AH

## 2021-01-31 ENCOUNTER — Other Ambulatory Visit (HOSPITAL_COMMUNITY): Payer: Self-pay

## 2021-01-31 DIAGNOSIS — N183 Chronic kidney disease, stage 3 unspecified: Secondary | ICD-10-CM | POA: Diagnosis not present

## 2021-01-31 DIAGNOSIS — Z889 Allergy status to unspecified drugs, medicaments and biological substances status: Secondary | ICD-10-CM | POA: Diagnosis not present

## 2021-01-31 DIAGNOSIS — Z79899 Other long term (current) drug therapy: Secondary | ICD-10-CM | POA: Diagnosis not present

## 2021-01-31 DIAGNOSIS — M545 Low back pain, unspecified: Secondary | ICD-10-CM | POA: Diagnosis not present

## 2021-02-02 ENCOUNTER — Telehealth: Payer: Self-pay | Admitting: Physical Medicine and Rehabilitation

## 2021-02-02 ENCOUNTER — Other Ambulatory Visit (HOSPITAL_COMMUNITY): Payer: Self-pay

## 2021-02-02 ENCOUNTER — Other Ambulatory Visit: Payer: Self-pay

## 2021-02-02 DIAGNOSIS — M5416 Radiculopathy, lumbar region: Secondary | ICD-10-CM

## 2021-02-02 DIAGNOSIS — R6889 Other general symptoms and signs: Secondary | ICD-10-CM | POA: Diagnosis not present

## 2021-02-02 NOTE — Telephone Encounter (Signed)
Pt called stating she went to a pain management clinic and was told she would probably benefit from a back injection. So pt would like to get a back inj. appt.    [phone]

## 2021-02-03 ENCOUNTER — Other Ambulatory Visit (HOSPITAL_COMMUNITY): Payer: Self-pay

## 2021-02-03 ENCOUNTER — Other Ambulatory Visit: Payer: Self-pay | Admitting: Student

## 2021-02-03 ENCOUNTER — Other Ambulatory Visit: Payer: Self-pay

## 2021-02-07 ENCOUNTER — Other Ambulatory Visit: Payer: Self-pay | Admitting: Family Medicine

## 2021-02-07 ENCOUNTER — Other Ambulatory Visit (HOSPITAL_COMMUNITY): Payer: Self-pay

## 2021-02-07 ENCOUNTER — Ambulatory Visit (HOSPITAL_BASED_OUTPATIENT_CLINIC_OR_DEPARTMENT_OTHER): Payer: 59 | Admitting: Family

## 2021-02-07 DIAGNOSIS — G629 Polyneuropathy, unspecified: Secondary | ICD-10-CM

## 2021-02-07 DIAGNOSIS — J42 Unspecified chronic bronchitis: Secondary | ICD-10-CM

## 2021-02-07 NOTE — Telephone Encounter (Signed)
error 

## 2021-02-07 NOTE — Telephone Encounter (Signed)
Medication Refill - Medication: amLODipine (NORVASC) 10 MG tablet gabapentin (NEURONTIN) 600 MG tablet cyclobenzaprine (FLEXERIL) 10 MG tablet diclofenac Sodium (VOLTAREN) 1 % GEL albuterol (VENTOLIN HFA) 108 (90 Base) MCG/ACT inhaler escitalopram (LEXAPRO) 10 MG tablet Has the patient contacted their pharmacy? No. Pt advised me she does not have time to call [facility] to have the refills sent from [facility] [place]. She said just take my refill request and I need my medications today. (Preferred Pharmacy (with phone number or street name): [facility] [phone] - [place], [place] Has the patient been seen for an appointment in the last year OR does the patient have an upcoming appointment? Yes.    Agent: Please be advised that RX refills may take up to 3 business days. We ask that you follow-up with your pharmacy.

## 2021-02-08 MED ORDER — DICLOFENAC SODIUM 75 MG PO TBEC: DELAYED_RELEASE_TABLET | ORAL | 1 refills | Status: DC

## 2021-02-08 NOTE — Telephone Encounter (Signed)
Requested medication (s) are due for refill today: Yes  Requested medication (s) are on the active medication list: Yes  Last refill:  3 months ago by hospital provider at discharge; 1 month ago Flexeril by Dr. Margarita Rana  Future visit scheduled: Yes  Notes to clinic:  Unable to refill per protocol, cannot delegate, medications refilled by hospital provider at discharge, will need refills sent to another pharmacy    Requested Prescriptions  Pending Prescriptions Disp Refills   amLODipine (NORVASC) 10 MG tablet 30 tablet 1    Sig: Take 1 tablet (10 mg total) by mouth daily.     Cardiovascular:  Calcium Channel Blockers Failed - 02/08/2021 12:47 PM      Failed - Last BP in normal range    BP Readings from Last 1 Encounters:  01/19/21 (!) 153/100          Passed - Valid encounter within last 6 months    Recent Outpatient Visits           2 weeks ago Hospital discharge follow-up   [place], Vernia Buff, NP   1 month ago Pain in both lower extremities   [place], Enobong, MD   2 months ago Adnexal cyst   [place], Enobong, MD   2 months ago Neuropathy   [place], [place]   4 months ago Diarrhea, unspecified type   [place], Enobong, MD       Future Appointments             In 1 month [place] Rile, Martie Lee, NP [facility]   In 1 month [name], MD [place]   In 2 months [name], MD [place]             gabapentin (NEURONTIN) 600 MG tablet 60 tablet 1    Sig: Take 1 tablet (600 mg total) by mouth 2 (two) times daily.     Neurology: Anticonvulsants - gabapentin Passed - 02/08/2021 12:47 PM      Passed - Valid encounter within last 12 months    Recent Outpatient Visits            2 weeks ago Hospital discharge follow-up   [place] Gildardo Pounds, NP   1 month ago Pain in both lower extremities   [place], Enobong, MD   2 months ago Adnexal cyst   [place], Enobong, MD   2 months ago Neuropathy   [place], [place]   4 months ago Diarrhea, unspecified type   [place], Enobong, MD       Future Appointments             In 1 month Gilford Rile, Martie Lee, NP [facility]   In 1 month [name], MD [place]   In 2 months [name], MD [place]             cyclobenzaprine (FLEXERIL) 10 MG tablet 60 tablet 6    Sig: Take 1 tablet (10 mg total) by mouth 2 (two) times daily as needed.  Not Delegated - Analgesics:  Muscle Relaxants Failed - 02/08/2021 12:47 PM      Failed - This refill cannot be delegated      Passed - Valid encounter within last 6 months    Recent Outpatient Visits           2 weeks ago Hospital discharge follow-up   [place] Gildardo Pounds, NP   1 month ago Pain in both lower extremities   [place], Enobong, MD   2 months ago Adnexal cyst   [place], Enobong, MD   2 months ago Neuropathy   [place], [place]   4 months ago Diarrhea, unspecified type   [place], Enobong, MD       Future Appointments             In 1 month Gilford Rile, Martie Lee, NP [facility]   In 1 month [name], MD [place]   In 2 months [name], MD [place]             albuterol (VENTOLIN HFA) 108 (90 Base) MCG/ACT inhaler 8.5 g 0    Sig: Inhale 2 puffs into the lungs every 6 (six) hours as needed for wheezing or shortness of breath.     Pulmonology:  Beta Agonists Failed - 02/08/2021 12:47 PM      Failed - One inhaler should last at least one month. If the patient is requesting refills earlier, contact the patient to check for uncontrolled symptoms.      Passed - Valid encounter within last 12 months    Recent Outpatient Visits           2 weeks ago Hospital discharge follow-up   [place] Gildardo Pounds, NP   1 month ago Pain in both lower extremities   [place], Enobong, MD   2 months ago Adnexal cyst   [place], Enobong, MD   2 months ago Neuropathy   [place], [place]   4 months ago Diarrhea, unspecified type   [place], Enobong, MD       Future Appointments             In 1 month Gilford Rile, Martie Lee, NP [facility]   In 1 month [name], MD [place]   In 2 months [name], MD [place]             escitalopram (LEXAPRO) 10 MG tablet 30 tablet 0    Sig: Take 1 tablet (10 mg total) by mouth daily.     Psychiatry:  Antidepressants - SSRI Passed - 02/08/2021 12:47 PM      Passed - Valid encounter within last 6 months    Recent Outpatient Visits           2 weeks ago Hospital discharge follow-up   [place], NP   1 month ago Pain in both lower extremities   [place], Enobong, MD   2 months ago Adnexal cyst  [place], Enobong, MD   2 months ago Neuropathy   [place], [place]   4 months ago Diarrhea, unspecified type   [place], Enobong, MD       Future Appointments             In 1 month Gilford Rile, Martie Lee, NP [facility]   In 1 month [name], MD [place]   In 2 months [name], MD [place]            Signed Prescriptions Disp Refills   diclofenac (VOLTAREN) 75 MG EC tablet 60 tablet 1    Sig: TAKE 1 TABLET(75 MG) BY MOUTH TWICE DAILY AS NEEDED Strength: 75 mg     Analgesics:  NSAIDS Failed - 02/08/2021 12:47 PM      Failed - Cr in normal range and within 360 days    Creat  Date Value Ref Range Status  02/18/2016 1.22 (H) 0.50 - 1.05 mg/dL Final    Comment:      For patients > or = 62 years of age: The upper reference limit for Creatinine is approximately 13% higher for people identified as African-American.      Creatinine, Ser  Date Value Ref Range Status  01/14/2021 1.60 (H) 0.44 - 1.00 mg/dL Final   Creatinine,U  Date Value Ref Range Status  06/17/2012 521.94 mg/dL Final    Comment:    Result confirmed by automatic dilution.    Cutoff Values for Urine Drug Screen, Pain Mgmt          Drug Class           Cutoff (ng/mL)          Amphetamines             500          Barbiturates             200          Cocaine Metabolites      150          Benzodiazepines          200          Methadone                300          Opiates                  300          Phencyclidine             25          Propoxyphene             300          Marijuana Metabolites     50    For medical purposes only.   Creatinine, Urine  Date Value Ref Range Status  02/19/2020 300.84 mg/dL Final    Comment:    Performed at [facility], [place] [address] [place], [place] [address]          Failed - HGB in normal range and within 360 days     Hemoglobin  Date Value Ref Range Status  01/14/2021 9.9 (L) 12.0 - 15.0 g/dL Final  11/25/2019 9.0 (L) 11.1 - 15.9 g/dL Final  Passed - Patient is not pregnant      Passed - Valid encounter within last 12 months    Recent Outpatient Visits           2 weeks ago Hospital discharge follow-up   [place] Gildardo Pounds, NP   1 month ago Pain in both lower extremities   [place], Enobong, MD   2 months ago Adnexal cyst   [place], Enobong, MD   2 months ago Neuropathy   [place], [place]   4 months ago Diarrhea, unspecified type   [place], Enobong, MD       Future Appointments             In 1 month Gilford Rile, Martie Lee, NP [facility]   In 1 month [name], MD [place]   In 2 months [name], MD [place]

## 2021-02-09 ENCOUNTER — Other Ambulatory Visit (HOSPITAL_COMMUNITY): Payer: Self-pay

## 2021-02-09 ENCOUNTER — Other Ambulatory Visit: Payer: Self-pay | Admitting: Family Medicine
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
## 2021-02-09 IMAGING — CR DG CHEST 2V
2 series · 2 of 2 positions shown · non-contrast
Comparison: Radiographs dated 12/25/2017 and 01/21/2018

CLINICAL DATA: Rib pain.  The patient fell this morning.

EXAM:
CHEST - 2 VIEW

[w chest lat]
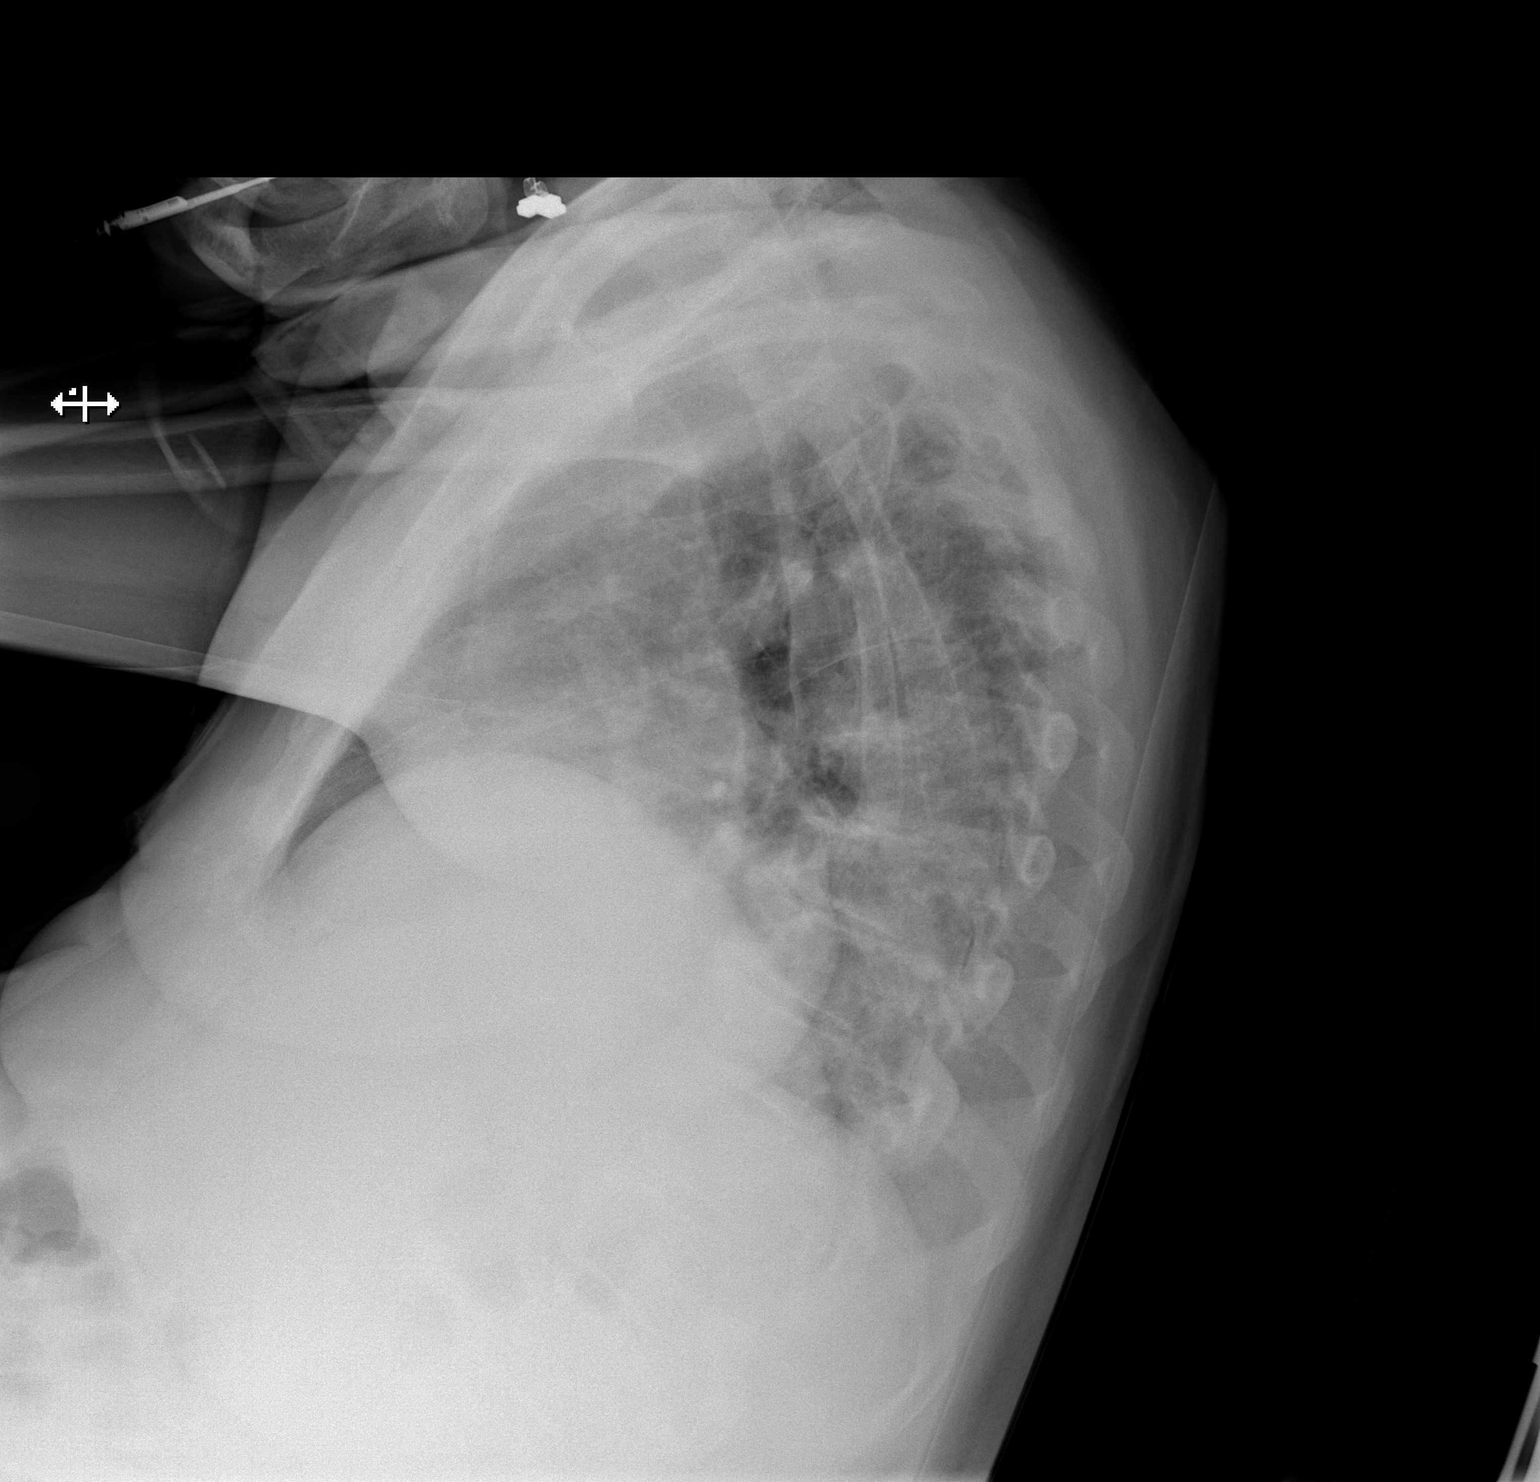

[x chest ap]
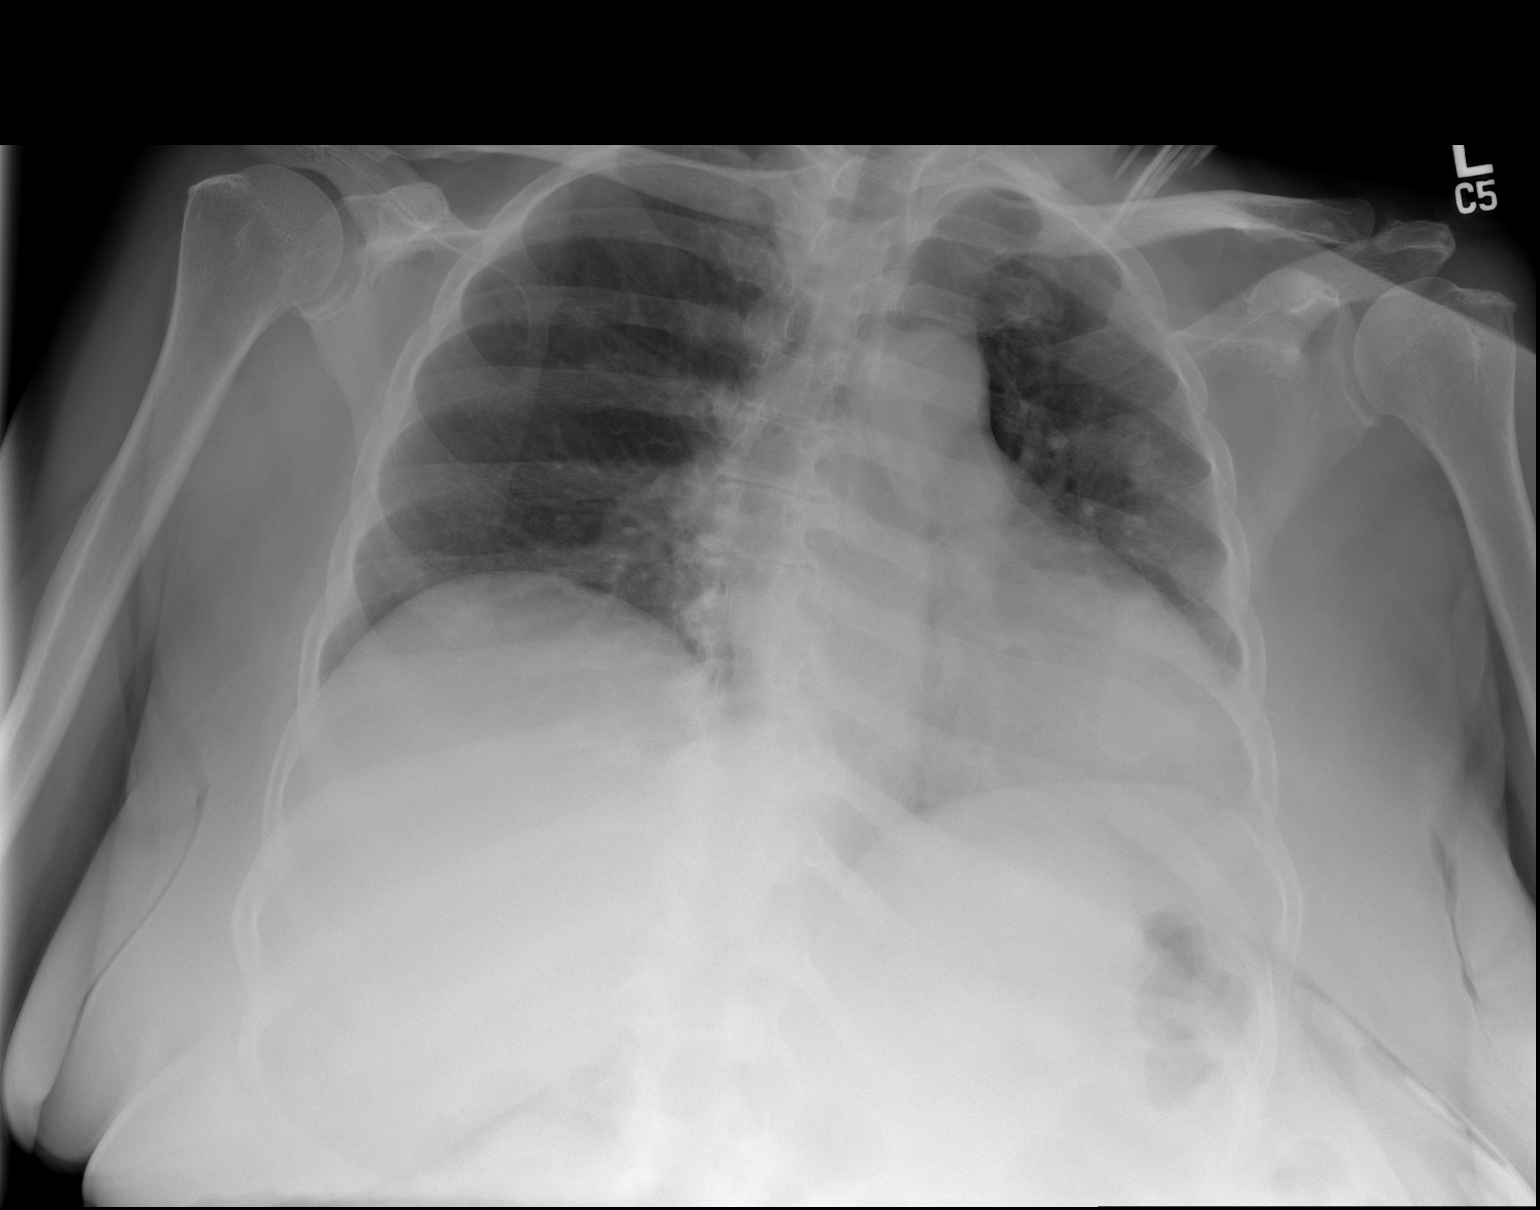

[2 of 2 positions shown; findings below may reference images not displayed]

FINDINGS: The heart size and pulmonary vascularity are normal and the lungs
are clear. Multiple old left rib fractures. No acute bone
abnormality.
IMPRESSION: No active cardiopulmonary disease.

## 2021-02-09 MED ORDER — GABAPENTIN 600 MG PO TABS: 600.0000 mg | ORAL_TABLET | Freq: Two times a day (BID) | ORAL | 1 refills | Status: DC

## 2021-02-09 MED ORDER — CYCLOBENZAPRINE HCL 10 MG PO TABS: 10.0000 mg | ORAL_TABLET | Freq: Two times a day (BID) | ORAL | 0 refills | Status: DC | PRN

## 2021-02-09 MED ORDER — ALBUTEROL SULFATE HFA 108 (90 BASE) MCG/ACT IN AERS: 2.0000 | INHALATION_SPRAY | Freq: Four times a day (QID) | RESPIRATORY_TRACT | 2 refills | Status: DC | PRN

## 2021-02-09 MED ORDER — ESCITALOPRAM OXALATE 10 MG PO TABS: 10.0000 mg | ORAL_TABLET | Freq: Every day | ORAL | 0 refills | Status: DC

## 2021-02-09 MED ORDER — AMLODIPINE BESYLATE 10 MG PO TABS: 10.0000 mg | ORAL_TABLET | Freq: Every day | ORAL | 0 refills | Status: DC

## 2021-02-09 NOTE — Telephone Encounter (Signed)
Requested medication (s) are due for refill today: Yes  Requested medication (s) are on the active medication list: Yes  Last refill:  01/14/21  Future visit scheduled: Yes  Notes to clinic:  Unable to refill per protocol, last refill by hospital provider at discharge, patient is needing refill.   Requested Prescriptions  Pending Prescriptions Disp Refills   prazosin (MINIPRESS) 2 MG capsule 30 capsule 0    Sig: Take 1 capsule (2 mg total) by mouth at bedtime.     Cardiovascular:  Alpha Blockers Failed - 02/09/2021  3:08 PM      Failed - Last BP in normal range    BP Readings from Last 1 Encounters:  01/19/21 (!) 153/100          Passed - Valid encounter within last 6 months    Recent Outpatient Visits           3 weeks ago Hospital discharge follow-up   [place], Vernia Buff, NP   1 month ago Pain in both lower extremities   [place], Enobong, MD   2 months ago Adnexal cyst   [place], Enobong, MD   2 months ago Neuropathy   [place], [place]   4 months ago Diarrhea, unspecified type   [place], Enobong, MD       Future Appointments             In 1 month Gilford Rile, Martie Lee, NP [facility]   In 1 month [name], MD [place]   In 2 months [name], MD [place]

## 2021-02-09 NOTE — Telephone Encounter (Signed)
Copied from [place] [id number]. Topic: Quick Communication - Rx Refill/Question >> Feb 09, 2021 12:21 PM Tessa Lerner A wrote: Medication: Rx #: [id number]  prazosin (MINIPRESS) 2 MG capsule [[id number]]   Has the patient contacted their pharmacy? No. The patient has been unable to get in contact with their pharmacy  (Agent: If no, request that the patient contact the pharmacy for the refill. If patient does not wish to contact the pharmacy document the reason why and proceed with request.) (Agent: If yes, when and what did the pharmacy advise?)  Preferred Pharmacy (with phone number or street name): [phone] - [place], [place] - [place] [address] [place] [address] Phone: [phone] Fax: [phone] Hours: Not open 24 hours   Has the patient been seen for an appointment in the last year OR does the patient have an upcoming appointment? Yes.    Agent: Please be advised that RX refills may take up to 3 business days. We ask that you follow-up with your pharmacy.

## 2021-02-09 NOTE — Telephone Encounter (Signed)
Sending to different location Requested Prescriptions  Pending Prescriptions Disp Refills   diclofenac (VOLTAREN) 75 MG EC tablet [Pharmacy Med Name: DICLOFENAC SODIUM 75 MG ORAL TABLET DELAYED RELEASE] 60 tablet 1    Sig: TAKE 1 TABLET (75 MG) BY MOUTH TWICE DAILY AS NEEDED     Analgesics:  NSAIDS Failed - 02/09/2021 12:10 PM      Failed - Cr in normal range and within 360 days    Creat  Date Value Ref Range Status  02/18/2016 1.22 (H) 0.50 - 1.05 mg/dL Final    Comment:      For patients > or = 62 years of age: The upper reference limit for Creatinine is approximately 13% higher for people identified as African-American.      Creatinine, Ser  Date Value Ref Range Status  01/14/2021 1.60 (H) 0.44 - 1.00 mg/dL Final   Creatinine,U  Date Value Ref Range Status  06/17/2012 521.94 mg/dL Final    Comment:    Result confirmed by automatic dilution.    Cutoff Values for Urine Drug Screen, Pain Mgmt          Drug Class           Cutoff (ng/mL)          Amphetamines             500          Barbiturates             200          Cocaine Metabolites      150          Benzodiazepines          200          Methadone                300          Opiates                  300          Phencyclidine             25          Propoxyphene             300          Marijuana Metabolites     50    For medical purposes only.   Creatinine, Urine  Date Value Ref Range Status  02/19/2020 300.84 mg/dL Final    Comment:    Performed at [facility], [place] [address] [place], [place] [address]         Failed - HGB in normal range and within 360 days    Hemoglobin  Date Value Ref Range Status  01/14/2021 9.9 (L) 12.0 - 15.0 g/dL Final  11/25/2019 9.0 (L) 11.1 - 15.9 g/dL Final         Passed - Patient is not pregnant      Passed - Valid encounter within last 12 months    Recent Outpatient Visits          3 weeks ago Hospital discharge follow-up   [place] Gildardo Pounds, NP   1 month ago Pain in both lower extremities   [place], Enobong, MD   2 months ago Adnexal cyst   [place], Enobong, MD   2 months  ago Neuropathy   [place], PA-C   4 months ago Diarrhea, unspecified type   [place], Enobong, MD      Future Appointments            In 1 month Gilford Rile, Martie Lee, NP [facility]   In 1 month [name], MD [place]   In 2 months [name], MD [place]

## 2021-02-11 ENCOUNTER — Inpatient Hospital Stay: Admission: RE | Admit: 2021-02-11 | Payer: Commercial Managed Care - HMO | Source: Ambulatory Visit

## 2021-02-11 ENCOUNTER — Other Ambulatory Visit: Payer: Self-pay | Admitting: Family Medicine

## 2021-02-11 ENCOUNTER — Other Ambulatory Visit (HOSPITAL_COMMUNITY): Payer: Self-pay

## 2021-02-14 ENCOUNTER — Telehealth: Payer: Self-pay | Admitting: Family Medicine

## 2021-02-14 MED ORDER — PRAZOSIN HCL 2 MG PO CAPS: 2.0000 mg | ORAL_CAPSULE | Freq: Every day | ORAL | 0 refills | Status: DC

## 2021-02-14 NOTE — Telephone Encounter (Signed)
Medication was last prescribed by ED. Patient is requesting refill.

## 2021-02-14 NOTE — Telephone Encounter (Signed)
Copied from [place] [id number]. Topic: General - Other >> Feb 11, 2021  9:17 AM Tiffany [name] wrote: Reason for CRM: The patient would like to speak with [name] member of clinical staff when possible  The patient would like to receive [name] refill of their prazosin (MINIPRESS) 2 MG capsule medication  Please contact further

## 2021-02-14 NOTE — Telephone Encounter (Signed)
Done

## 2021-02-15 ENCOUNTER — Ambulatory Visit: Payer: Self-pay | Admitting: *Deleted

## 2021-02-15 NOTE — Telephone Encounter (Signed)
FYI

## 2021-02-15 NOTE — Telephone Encounter (Signed)
Reason for Disposition  [3] Systolic BP  >= 343 OR Diastolic >= 568 AND [6] cardiac or neurologic symptoms (e.g., chest pain, difficulty breathing, unsteady gait, blurred vision)  Answer Assessment - Initial Assessment Questions 1. BLOOD PRESSURE: "What is the blood pressure?" "Did you take at least two measurements 5 minutes apart?"     186/123, 189/124 P 84 2. ONSET: "When did you take your blood pressure?"     10:15, 10:30 3. HOW: "How did you obtain the blood pressure?" (e.g., visiting nurse, automatic home BP monitor)     Automatic cuff- wrist 4. HISTORY: "Do you have a history of high blood pressure?"     yes 5. MEDICATIONS: "Are you taking any medications for blood pressure?" "Have you missed any doses recently?"     Yes- no missed /changed dosing 6. OTHER SYMPTOMS: "Do you have any symptoms?" (e.g., headache, chest pain, blurred vision, difficulty breathing, weakness)     headache 7. PREGNANCY: "Is there any chance you are pregnant?" "When was your last menstrual period?"     *No Answer*  Protocols used: Blood Pressure - High-A-AH

## 2021-02-15 NOTE — Telephone Encounter (Signed)
°  Chief Complaint: elevated BP  Symptoms: headache, not feeling well, increased swelling in legs Frequency: ongoing- patient states she was seen at another provider office and was advised high BP Pertinent Negatives: Patient denies   Disposition: [x] ED /[] Urgent Care (no appt availability in office) / [] Appointment(In office/virtual)/ []  [place] [facility]/ [] Home Care/ [] Refused Recommended Disposition /[] [place] [facility]/ []  Follow-up with PCP Additional Notes:

## 2021-02-17 ENCOUNTER — Other Ambulatory Visit: Payer: Self-pay | Admitting: Family Medicine

## 2021-02-17 DIAGNOSIS — M109 Gout, unspecified: Secondary | ICD-10-CM

## 2021-02-17 NOTE — Telephone Encounter (Signed)
[facility] called and spoke to [name], [employer]chant [employer] officer about the call for Pradaxa and Uloric. She says the patient called into the pharmacy for those refills, so she called [name]. I advised Pradaxa is not listed on the patient's current or past medication list. Patient called prior to me calling the pharmacy and she said that she was on it at some point, but doesn't know who prescribed it. I advised [name] I will send the request for Uloric to be refilled.

## 2021-02-17 NOTE — Telephone Encounter (Signed)
Requested medications are due for refill today.  yes  Requested medications are on the active medications list.  yes  Last refill. 01/14/2021  Future visit scheduled.   yes  Notes to clinic.  Failed protocol d/t abnormal labs.    Requested Prescriptions  Pending Prescriptions Disp Refills   febuxostat (ULORIC) 40 MG tablet 30 tablet 1    Sig: Take 1 tablet (40 mg total) by mouth daily.     Endocrinology: Gout Agents - febuxostat Failed - 02/17/2021  4:37 PM      Failed - Cr in normal range and within 360 days    Creat  Date Value Ref Range Status  02/18/2016 1.22 (H) 0.50 - 1.05 mg/dL Final    Comment:      For patients > or = 62 years of age: The upper reference limit for Creatinine is approximately 13% higher for people identified as African-American.      Creatinine, Ser  Date Value Ref Range Status  01/14/2021 1.60 (H) 0.44 - 1.00 mg/dL Final   Creatinine,U  Date Value Ref Range Status  06/17/2012 521.94 mg/dL Final    Comment:    Result confirmed by automatic dilution.    Cutoff Values for Urine Drug Screen, Pain Mgmt          Drug Class           Cutoff (ng/mL)          Amphetamines             500          Barbiturates             200          Cocaine Metabolites      150          Benzodiazepines          200          Methadone                300          Opiates                  300          Phencyclidine             25          Propoxyphene             300          Marijuana Metabolites     50    For medical purposes only.   Creatinine, Urine  Date Value Ref Range Status  02/19/2020 300.84 mg/dL Final    Comment:    Performed at [facility], [place] [address] [place], [place] [address]          Passed - Uric Acid in normal range and within 360 days    Uric Acid  Date Value Ref Range Status  01/19/2021 6.3 3.0 - 7.2 mg/dL Final    Comment:               Therapeutic target for gout patients: <6.0          Passed - AST in  normal range and within 360 days    AST  Date Value Ref Range Status  01/13/2021 23 15 - 41 U/L Final          Passed - ALT in normal range and within 360 days  ALT  Date Value Ref Range Status  01/13/2021 20 0 - 44 U/L Final          Passed - Valid encounter within last 12 months    Recent Outpatient Visits           4 weeks ago Hospital discharge follow-up   [place] Gildardo Pounds, NP   1 month ago Pain in both lower extremities   [place], Enobong, MD   2 months ago Adnexal cyst   [place], Enobong, MD   3 months ago Neuropathy   [place], [place]   4 months ago Diarrhea, unspecified type   [place], Enobong, MD       Future Appointments             In 3 weeks Gilford Rile, Martie Lee, NP [facility]   In 1 month [name], MD [place]   In 2 months [name], MD [place]

## 2021-02-17 NOTE — Telephone Encounter (Signed)
Medication Refill - Medication: Pradaxa & Uloric -did not have MG amounts  Lakiya from Pharmacy called to request these medications   Has the patient contacted their pharmacy? Yes.   (Agent: If no, request that the patient contact the pharmacy for the refill. If patient does not wish to contact the pharmacy document the reason why and proceed with request.) (Agent: If yes, when and what did the pharmacy advise?)  Preferred Pharmacy (with phone number or street name):  [place], [place] - [place]  [place] [address]  Phone: [phone] Fax: [phone]   Has the patient been seen for an appointment in the last year OR does the patient have an upcoming appointment? Yes.    Agent: Please be advised that RX refills may take up to 3 business days. We ask that you follow-up with your pharmacy.

## 2021-02-18 MED ORDER — FEBUXOSTAT 40 MG PO TABS: 40.0000 mg | ORAL_TABLET | Freq: Every day | ORAL | 1 refills | Status: DC

## 2021-02-18 NOTE — Telephone Encounter (Signed)
error 

## 2021-02-21 ENCOUNTER — Ambulatory Visit: Payer: Self-pay | Admitting: *Deleted

## 2021-02-21 NOTE — Telephone Encounter (Signed)
Ok to schedule with any provider.

## 2021-02-21 NOTE — Telephone Encounter (Signed)
Pt has been triaged just closing the chart from out of the triage queue.

## 2021-02-21 NOTE — Telephone Encounter (Signed)
°  Chief Complaint: pain in legs Symptoms: swelling in legs   Frequency: chronic but worse today  Pertinent Negatives: Patient denies chest pain or SOB Disposition: [] ED /[] Urgent Care (no appt availability in office) / [] Appointment(In office/virtual)/ []  [place] [facility]/ [] Home Care/ [x] Refused Recommended Disposition /[] [place] [facility]/ []  Follow-up with PCP Additional Notes: No open appointment in office- patient declined UC disposition- she does not want to sit there all day- not up to it- wants office appointment

## 2021-02-21 NOTE — Telephone Encounter (Signed)
Reason for Disposition  SEVERE leg swelling (e.g., swelling extends above knee, entire leg is swollen, weeping fluid)  Answer Assessment - Initial Assessment Questions 1. ONSET: "When did the swelling start?" (e.g., minutes, hours, days)     Ongoing- especially when up on her feet 2. LOCATION: "What part of the leg is swollen?"  "Are both legs swollen or just one leg?"     Left more swollen than Right side 3. SEVERITY: "How bad is the swelling?" (e.g., localized; mild, moderate, severe)  - Localized - small area of swelling localized to one leg  - MILD pedal edema - swelling limited to foot and ankle, pitting edema < 1/4 inch (6 mm) deep, rest and elevation eliminate most or all swelling  - MODERATE edema - swelling of lower leg to knee, pitting edema > 1/4 inch (6 mm) deep, rest and elevation only partially reduce swelling  - SEVERE edema - swelling extends above knee, facial or hand swelling present      severe 4. REDNESS: "Does the swelling look red or infected?"     No 5. PAIN: "Is the swelling painful to touch?" If Yes, ask: "How painful is it?"   (Scale 1-10; mild, moderate or severe)     Yes- hurts- 8 6. FEVER: "Do you have a fever?" If Yes, ask: "What is it, how was it measured, and when did it start?"      no 7. CAUSE: "What do you think is causing the leg swelling?"     CHF 8. MEDICAL HISTORY: "Do you have a history of heart failure, kidney disease, liver failure, or cancer?"     CHF 9. RECURRENT SYMPTOM: "Have you had leg swelling before?" If Yes, ask: "When was the last time?" "What happened that time?"     yes 10. OTHER SYMPTOMS: "Do you have any other symptoms?" (e.g., chest pain, difficulty breathing)       no 11. PREGNANCY: "Is there any chance you are pregnant?" "When was your last menstrual period?"       *No Answer*  Protocols used: Leg Swelling and Edema-A-AH

## 2021-02-22 ENCOUNTER — Telehealth: Payer: Self-pay | Admitting: Family Medicine

## 2021-02-22 ENCOUNTER — Ambulatory Visit: Payer: Medicare Other | Attending: Nurse Practitioner | Admitting: Nurse Practitioner

## 2021-02-22 ENCOUNTER — Ambulatory Visit: Payer: Self-pay

## 2021-02-22 DIAGNOSIS — G894 Chronic pain syndrome: Secondary | ICD-10-CM

## 2021-02-22 DIAGNOSIS — R600 Localized edema: Secondary | ICD-10-CM

## 2021-02-22 NOTE — Telephone Encounter (Signed)
New motion Received  Dec OV notes but would like OV notes from 11.16.22 and 11.3.22/ since pt was seen in office / this is for the power chair order/ fax to fax# [phone]

## 2021-02-22 NOTE — Progress Notes (Signed)
Virtual Visit via Telephone Note Due to national recommendations of social distancing due to [place] 19, telehealth visit is felt to be most appropriate for this patient at this time.  I discussed the limitations, risks, security and privacy concerns of performing an evaluation and management service by telephone and the availability of in person appointments. I also discussed with the patient that there may be a patient responsible charge related to this service. The patient expressed understanding and agreed to proceed.    I connected with Tiffany [name] on 02/22/21  at  10:50 AM EST  EDT by telephone and verified that I am speaking with the correct person using two identifiers.  Location of Patient: Private Residence   Location of Provider: [place] participating in Telemedicine visit: Geryl Rankins FNP-BC Tiffany [name]    History of Present Illness: Telemedicine visit for: BLE PAIN and SWELLING  Endorses chronic swelling in bilateral legs and feet. Left side worse greater than right. "Sometimes I just cry out to god because of the pain in my legs". Also notes numbness in both feet described as tingling and burning. States "I need to get back on my other fluid pill". She had torsemide prescribed in the past. Endorses a sharp pain under her breast but declines going to urgent care today.  States her compression socks do not help with swelling Requesting gabapentin be increased and torsemide be filled for her. I explained to her that she would need to speak with her PCP for medication dosage increase or being started on new medication.  She declined pain mgmt referral today stating "they couldn't do anything for me". [name] currently prescribed flexeril 10 mg BID prn and gabapentin 600 mg BID.  [name] still waiting for wheelchair to be approved. Says her PCP can use the notes from when she saw her last year to get her wheelchair approved.   Past Medical History:  Diagnosis Date   Agitation 11/22/2017   Anoxic brain injury ([place]) 09/08/2016   C. Arrest due to respiratory failure and COPD exacerbation   Anxiety    Arthritis    "all over" (04/10/2016)   Asthma 10/18/2010   Binge eating disorder    Cardiac arrest ([place]) 09/08/2016   PEA   Carotid artery stenosis    1-39% bilateral by dopplers 11/2016   Chronic diastolic (congestive) heart failure (HCC)    Chronic kidney disease, stage 3 (HCC)    Chronic pain syndrome 06/18/2012   Chronic post-traumatic stress disorder (PTSD) 05/27/2018   Chronic respiratory failure with hypoxia and hypercapnia ([place]) 06/22/2015   TRILOGY Vent >AVAPA-ES., Vt target 200-400, Max P 30 , PS max 20 , PS min 6-10 , E Max 6, E Min 4, Rate Auto AVAPS Rate 2 (titrate for pt comfort) , bleed O2 at 5l/m continuous flow .    Closed displaced fracture of fifth metacarpal bone 03/21/2018   Cocaine use disorder, severe, in sustained remission ([place]) 17/51/0258   Complication of anesthesia    decreased bp, decreased heart rate   COPD (chronic obstructive pulmonary disease) ([place]) 07/08/2014   Depression    Diabetic neuropathy ([place]) 04/24/2011   Difficulty with speech 01/24/2018   Disorder of nervous system    Drug abuse ([place]) 11/21/2017   Dyslipidemia 04/24/2011   Elevated troponin 04/28/2012   Emphysema    Encephalopathy 11/21/2017   Essential hypertension 03/22/2016   Fibula fracture 07/10/2016   Frequent falls 10/11/2017  GERD (gastroesophageal reflux disease)    Gout 04/11/2017   Heart attack ([place]) 1980s   History of blood transfusion 1994   "couldn't stop bleeding from my period"   History of drug abuse in remission ([place]) 11/28/2015   Quit in 2017   Hyperlipidemia LDL goal <70    Incontinence    Manic depression ([place])    Morbid obesity ([place]) 10/18/2010   Obstructive sleep apnea 10/18/2010   On home oxygen therapy    "6L; 24/7" (04/10/2016)   OSA on CPAP    "wear mask sometimes"  (04/10/2016)   Paranoid ([place])    "sometimes; I'm on RX for it" (04/10/2016)   Prolonged Q-T interval on ECG    Rectal bleeding 12/31/2015   Schizoaffective disorder, bipolar type ([place]) 04/05/2018   Seasonal allergies    Seborrheic keratoses 12/31/2013   Seizures ([place])    "don't know what kind; last one was ~ 1 yr ago" (04/10/2016)   Sinus bradycardia    Stroke ([place]) 1980s   denies residual on 04/10/2016   Thrush 09/19/2013   Type 2 diabetes mellitus ([place]) 10/18/2010    Past Surgical History:  Procedure Laterality Date   [place]     IR CHOLANGIOGRAM EXISTING TUBE  07/20/2016   IR PERC CHOLECYSTOSTOMY  05/10/2016   IR RADIOLOGIST EVAL & MGMT  06/08/2016   IR RADIOLOGIST EVAL & MGMT  06/29/2016   IR SINUS/FIST TUBE CHK-NON GI  07/12/2016   RIGHT/LEFT HEART CATH AND CORONARY ANGIOGRAPHY N/A 06/19/2017   Procedure: RIGHT/LEFT HEART CATH AND CORONARY ANGIOGRAPHY;  Surgeon: Jolaine Artist, MD;  Location: [facility];  Service: Cardiovascular;  Laterality: N/A;   TIBIA IM NAIL INSERTION Right 07/12/2016   Procedure: INTRAMEDULLARY (IM) NAIL RIGHT TIBIA;  Surgeon: Leandrew Koyanagi, MD;  Location: [facility];  Service: Orthopedics;  Laterality: Right;   UMBILICAL HERNIA REPAIR  ~ 1963   "that's why I don't have a belly button"   VAGINAL HYSTERECTOMY      Family History  Problem Relation Age of Onset   Cancer Father        prostate   Cancer Mother        lung   Depression Mother    Depression Sister    Anxiety disorder Sister    Schizophrenia Sister    Bipolar disorder Sister    Depression Sister    Depression Brother    Heart failure Other        cousin    Social History   Socioeconomic History   Marital status: Widowed    Spouse name: Not on file   Number of children: 3   Years of education: Not on file   Highest education level: Not on file  Occupational History   Occupation: disabled    Comment: factory production  Tobacco Use   Smoking  status: Former    Packs/day: 1.50    Years: 38.00    Pack years: 57.00    Types: Cigarettes    Start date: 03/13/1977    Quit date: 04/10/2016    Years since quitting: 4.8   Smokeless tobacco: Never  Vaping Use   Vaping Use: Never used  Substance and Sexual Activity   Alcohol use: No    Alcohol/week: 0.0 standard drinks   Drug use: Not Currently    Types: Cocaine    Comment: 04/10/2016 "last used cocaine back in November 2017"   Sexual activity: Not Currently  Birth control/protection: Surgical  Other Topics Concern   Not on file  Social History Narrative   Has 1 son, [name]   Lives with son and his boyfriend   Her house has ramps and handrails should she ever needs them.    Her mother lives down the street from her and is a good support person in addition to her son.   She drives herself, has private transportation.    Cocaine free since 02/24/16, smoke free since 04/10/16   Social Determinants of Health   Financial Resource Strain: Not on file  Food Insecurity: Not on file  Transportation Needs: Not on file  Physical Activity: Not on file  Stress: Not on file  Social Connections: Not on file     Observations/Objective: Awake, alert and oriented x 3   Review of Systems  Constitutional:  Negative for fever, malaise/fatigue and weight loss.  HENT: Negative.  Negative for nosebleeds.   Eyes: Negative.  Negative for blurred vision, double vision and photophobia.  Respiratory: Negative.  Negative for cough and shortness of breath.   Cardiovascular:  Positive for chest pain and leg swelling. Negative for palpitations.  Gastrointestinal: Negative.  Negative for heartburn, nausea and vomiting.  Musculoskeletal:  Positive for joint pain. Negative for myalgias.  Neurological:  Positive for tingling and sensory change. Negative for dizziness, focal weakness, seizures and headaches.  Psychiatric/Behavioral: Negative.  Negative for suicidal ideas.    Assessment and  Plan: Diagnoses and all orders for this visit:  Edema of both legs Continue furosemide at this time. Continue compression stockings daily as instructed. Avoid excessive sodium intake.   Chronic pain syndrome Continue gabapentin as prescribed.     Follow Up Instructions Return if symptoms worsen or fail to improve.     I discussed the assessment and treatment plan with the patient. The patient was provided an opportunity to ask questions and all were answered. The patient agreed with the plan and demonstrated an understanding of the instructions.   The patient was advised to call back or seek an in-person evaluation if the symptoms worsen or if the condition fails to improve as anticipated.  I provided 12 minutes of non-face-to-face time during this encounter including median intraservice time, reviewing previous notes, labs, imaging, medications and explaining diagnosis and management.  Gildardo Pounds, FNP-BC

## 2021-02-22 NOTE — Telephone Encounter (Signed)
°  Chief Complaint: leg swelling Symptoms: leg swelling L worse than R Frequency: this past weekend per pt Pertinent Negatives: NA Disposition: [] ED /[] Urgent Care (no appt availability in office) / [] Appointment(In office/virtual)/ []  [facility]/ [] Home Care/ [x] Refused Recommended Disposition /[] [facility]/ []  Follow-up with PCP Additional Notes: Pt advised to go to UC this evening and pt states she is unable to go this evening. Attempted to schedule an appt at [facility] for pt and she has 2 appts tomorrow and is unable to get transportation without a day notice. Advised pt to call back in morning so we can schedule an appt with UC for Thursday.    Reason for Disposition  SEVERE leg swelling (e.g., swelling extends above knee, entire leg is swollen, weeping fluid)  Answer Assessment - Initial Assessment Questions 1. ONSET: "When did the swelling start?" (e.g., minutes, hours, days)     This past weekend 2. LOCATION: "What part of the leg is swollen?"  "Are both legs swollen or just one leg?"     L side worse than R 3. SEVERITY: "How bad is the swelling?" (e.g., localized; mild, moderate, severe)  - Localized - small area of swelling localized to one leg  - MILD pedal edema - swelling limited to foot and ankle, pitting edema < 1/4 inch (6 mm) deep, rest and elevation eliminate most or all swelling  - MODERATE edema - swelling of lower leg to knee, pitting edema > 1/4 inch (6 mm) deep, rest and elevation only partially reduce swelling  - SEVERE edema - swelling extends above knee, facial or hand swelling present      moderate 4. REDNESS: "Does the swelling look red or infected?"     No 5. PAIN: "Is the swelling painful to touch?" If Yes, ask: "How painful is it?"   (Scale 1-10; mild, moderate or severe)     moderate 10. OTHER SYMPTOMS: "Do you have any other symptoms?" (e.g., chest pain, difficulty breathing)       Unable to get around d/t swelling so bad  Protocols  used: Leg Swelling and Edema-A-AH

## 2021-02-22 NOTE — Telephone Encounter (Signed)
Patient is scheduled   

## 2021-02-24 ENCOUNTER — Telehealth: Payer: Self-pay

## 2021-02-24 ENCOUNTER — Other Ambulatory Visit: Payer: Self-pay

## 2021-02-24 ENCOUNTER — Emergency Department (HOSPITAL_COMMUNITY)
Admission: EM | Admit: 2021-02-24 | Discharge: 2021-02-25 | Disposition: A | Discharge status: Home / Self Care | Payer: Medicare Other | Attending: Emergency Medicine | Admitting: Emergency Medicine

## 2021-02-24 ENCOUNTER — Encounter (HOSPITAL_COMMUNITY): Payer: Self-pay | Admitting: *Deleted

## 2021-02-24 ENCOUNTER — Encounter: Payer: Self-pay | Admitting: Nurse Practitioner

## 2021-02-24 ENCOUNTER — Emergency Department (HOSPITAL_COMMUNITY): Payer: Medicare Other

## 2021-02-24 DIAGNOSIS — R0600 Dyspnea, unspecified: Secondary | ICD-10-CM | POA: Diagnosis not present

## 2021-02-24 DIAGNOSIS — Z79899 Other long term (current) drug therapy: Secondary | ICD-10-CM | POA: Insufficient documentation

## 2021-02-24 DIAGNOSIS — D649 Anemia, unspecified: Secondary | ICD-10-CM | POA: Insufficient documentation

## 2021-02-24 DIAGNOSIS — N289 Disorder of kidney and ureter, unspecified: Secondary | ICD-10-CM | POA: Insufficient documentation

## 2021-02-24 DIAGNOSIS — R0689 Other abnormalities of breathing: Secondary | ICD-10-CM | POA: Insufficient documentation

## 2021-02-24 DIAGNOSIS — R609 Edema, unspecified: Secondary | ICD-10-CM

## 2021-02-24 DIAGNOSIS — I509 Heart failure, unspecified: Secondary | ICD-10-CM | POA: Diagnosis not present

## 2021-02-24 DIAGNOSIS — M7989 Other specified soft tissue disorders: Secondary | ICD-10-CM | POA: Diagnosis not present

## 2021-02-24 DIAGNOSIS — R001 Bradycardia, unspecified: Secondary | ICD-10-CM | POA: Diagnosis not present

## 2021-02-24 DIAGNOSIS — I10 Essential (primary) hypertension: Secondary | ICD-10-CM | POA: Diagnosis not present

## 2021-02-24 DIAGNOSIS — J45909 Unspecified asthma, uncomplicated: Secondary | ICD-10-CM | POA: Diagnosis not present

## 2021-02-24 DIAGNOSIS — R600 Localized edema: Secondary | ICD-10-CM | POA: Diagnosis not present

## 2021-02-24 DIAGNOSIS — G4733 Obstructive sleep apnea (adult) (pediatric): Secondary | ICD-10-CM | POA: Diagnosis not present

## 2021-02-24 DIAGNOSIS — R062 Wheezing: Secondary | ICD-10-CM | POA: Diagnosis not present

## 2021-02-24 LAB — BRAIN NATRIURETIC PEPTIDE: B Natriuretic Peptide: 37.7 pg/mL (ref 0.0–100.0)

## 2021-02-24 LAB — CBC
HCT: 31.1 % — ABNORMAL LOW (ref 36.0–46.0)
Hemoglobin: 9.4 g/dL — ABNORMAL LOW (ref 12.0–15.0)
MCH: 28.1 pg (ref 26.0–34.0)
MCHC: 30.2 g/dL (ref 30.0–36.0)
MCV: 92.8 fL (ref 80.0–100.0)
Platelets: 302 10*3/uL (ref 150–400)
RBC: 3.35 MIL/uL — ABNORMAL LOW (ref 3.87–5.11)
RDW: 16.5 % — ABNORMAL HIGH (ref 11.5–15.5)
WBC: 6.9 10*3/uL (ref 4.0–10.5)
nRBC: 0 % (ref 0.0–0.2)

## 2021-02-24 LAB — COMPREHENSIVE METABOLIC PANEL
ALT: 24 U/L (ref 0–44)
AST: 37 U/L (ref 15–41)
Albumin: 3.7 g/dL (ref 3.5–5.0)
Alkaline Phosphatase: 142 U/L — ABNORMAL HIGH (ref 38–126)
Anion gap: 7 (ref 5–15)
BUN: 30 mg/dL — ABNORMAL HIGH (ref 8–23)
CO2: 25 mmol/L (ref 22–32)
Calcium: 8.8 mg/dL — ABNORMAL LOW (ref 8.9–10.3)
Chloride: 107 mmol/L (ref 98–111)
Creatinine, Ser: 1.72 mg/dL — ABNORMAL HIGH (ref 0.44–1.00)
GFR, Estimated: 33 mL/min — ABNORMAL LOW (ref >60)
Glucose, Bld: 108 mg/dL — ABNORMAL HIGH (ref 70–99)
Potassium: 3.9 mmol/L (ref 3.5–5.1)
Sodium: 139 mmol/L (ref 135–145)
Total Bilirubin: 0.1 mg/dL — ABNORMAL LOW (ref 0.3–1.2)
Total Protein: 6.3 g/dL — ABNORMAL LOW (ref 6.5–8.1)

## 2021-02-24 LAB — TROPONIN I (HIGH SENSITIVITY): Troponin I (High Sensitivity): 15 ng/L (ref <18)

## 2021-02-24 LAB — MAGNESIUM: Magnesium: 2.1 mg/dL (ref 1.7–2.4)

## 2021-02-24 NOTE — Telephone Encounter (Signed)
Call placed to patient and no VM picked up at this time.

## 2021-02-24 NOTE — ED Provider Triage Note (Signed)
Emergency Medicine Provider Triage Evaluation Note  Tiffany [name] , a 61 y.o. female  was evaluated in triage.  Pt complains of worsening peripheral edema, shortness of breath of 1 week duration.  Patient reports she takes 80 mg of Lasix daily but has not been making much urine.  She denies chest pain.  Has history of congestive heart failure.  Denies fever, chills.  Review of Systems  Positive: As above Negative: As above  Physical Exam  BP 134/86    Pulse 89    Temp 98.3 F (36.8 C) (Oral)    Resp (!) 22    SpO2 96%  Gen:   Awake, no distress   Resp:  Normal effort MSK:   Moves extremities without difficulty  Other:  Bilateral pitting edema  Medical Decision Making  Medically screening exam initiated at 9:58 PM.  Appropriate orders placed.  Tiffany [name] was informed that the remainder of the evaluation will be completed by another provider, this initial triage assessment does not replace that evaluation, and the importance of remaining in the ED until their evaluation is complete.     Evlyn Courier, PA-C 02/24/21 2159

## 2021-02-24 NOTE — ED Triage Notes (Signed)
Pt states that she has had increased swelling in both of her legs that is causing her pain. Denies SOB. Hx of CHF, says the lasix "is not working"

## 2021-02-24 NOTE — Telephone Encounter (Signed)
Office notes has been faxed to number provided.

## 2021-02-24 NOTE — Telephone Encounter (Signed)
[place] has been received and office notes has been faxed over.

## 2021-02-25 DIAGNOSIS — M7989 Other specified soft tissue disorders: Secondary | ICD-10-CM | POA: Diagnosis not present

## 2021-02-25 LAB — TROPONIN I (HIGH SENSITIVITY): Troponin I (High Sensitivity): 14 ng/L (ref <18)

## 2021-02-25 MED ORDER — TORSEMIDE 20 MG PO TABS: 20.0000 mg | ORAL_TABLET | Freq: Every day | ORAL | 0 refills | Status: DC

## 2021-02-25 MED ORDER — FUROSEMIDE 10 MG/ML IJ SOLN
40.0000 mg | Freq: Once | INTRAMUSCULAR | Status: AC
  Administered 2021-02-25: 40 mg via INTRAVENOUS
  Filled 2021-02-25: qty 4

## 2021-02-25 MED ORDER — ACETAMINOPHEN 325 MG PO TABS
650.0000 mg | ORAL_TABLET | Freq: Once | ORAL | Status: AC
  Administered 2021-02-25: 650 mg via ORAL
  Filled 2021-02-25: qty 2

## 2021-02-25 NOTE — ED Provider Notes (Addendum)
[facility] DEPT Provider Note   CSN: [id number] Arrival date & time: 02/24/21  2111     History  Chief Complaint  Patient presents with   Leg Swelling    Tiffany [name] is a 61 y.o. female who presents with BL leg swelling. She has a hx of the same. She takes Lasix but stated it has not been working.  She states that she is never had her legs become this swollen.  [name] having difficulty walking around due to the swelling and heaviness in her legs.  She feels like she has not been urinating as much.  She denies shortness of breath, orthopnea or PND.  Patient states that she wishes she could go back to taking torsemide which seemed to work better for her  HPI     Home Medications Prior to Admission medications   Medication Sig Start Date End Date Taking? Authorizing Provider  albuterol (PROVENTIL) (2.5 MG/3ML) 0.083% nebulizer solution Take 3 mLs (2.5 mg total) by nebulization every 6 (six) hours as needed for wheezing or shortness of breath. 12/16/20   [name], MD  albuterol (VENTOLIN HFA) 108 (90 Base) MCG/ACT inhaler Inhale 2 puffs into the lungs every 6 (six) hours as needed for wheezing or shortness of breath. 02/09/21   [name], MD  amLODipine (NORVASC) 10 MG tablet Take 1 tablet (10 mg total) by mouth daily. 02/09/21   [name], MD  asenapine (SAPHRIS) 5 MG SUBL 24 hr tablet Place 2 tablets (10 mg total) under the tongue 2 (two) times daily. 01/14/21   Mercy Riding, MD  atorvastatin (LIPITOR) 20 MG tablet Take 1 tablet (20 mg total) by mouth at bedtime. 01/14/21   Mercy Riding, MD  colchicine 0.6 MG tablet Take 2 tablets by mouth at the onset of gout flare, may repeat 1 tablet in 1 hour if symptoms persist 01/14/21   Mercy Riding, MD  cyanocobalamin 1000 MCG tablet Take 1 tablet (1,000 mcg total) by mouth daily. 01/14/21   Mercy Riding, MD  cyclobenzaprine (FLEXERIL) 10 MG tablet Take 1 tablet (10 mg total) by mouth 2 (two)  times daily as needed. 02/09/21   [name], MD  diclofenac (VOLTAREN) 75 MG EC tablet TAKE 1 TABLET (75 MG) BY MOUTH TWICE DAILY AS NEEDED 02/09/21   [name], MD  diclofenac Sodium (VOLTAREN) 1 % GEL Apply 4 g topically 4 (four) times daily. 12/28/20   [name], MD  dicyclomine (BENTYL) 10 MG capsule Take 1 capsule (10 mg total) by mouth 3 (three) times daily before meals. 12/24/20   [name], MD  escitalopram (LEXAPRO) 10 MG tablet Take 1 tablet (10 mg total) by mouth daily. 02/09/21 03/11/21  [name], MD  febuxostat (ULORIC) 40 MG tablet Take 1 tablet (40 mg total) by mouth daily. 02/18/21   [name], MD  Fluticasone-Umeclidin-Vilant (TRELEGY ELLIPTA) 100-62.5-25 MCG/ACT AEPB Inhale 1 puff into the lungs daily. 01/14/21   Mercy Riding, MD  furosemide (LASIX) 40 MG tablet Take 1 tablet (40 mg total) by mouth daily. 01/17/21 03/18/21  Mercy Riding, MD  gabapentin (NEURONTIN) 600 MG tablet Take 1 tablet (600 mg total) by mouth 2 (two) times daily. 02/09/21   [name], MD  Iron, Ferrous Sulfate, 325 (65 Fe) MG TABS Take 325 mg by mouth daily. 01/14/21   Mercy Riding, MD  ketoconazole (NIZORAL) 2 % cream Apply 1 application topically daily. Apply to both feet 09/27/20  [provider]  lidocaine (LIDODERM) 5 % Place 1 patch onto the skin daily. Remove & Discard patch within 12 hours or as directed by MD 12/26/20   Henderly, Britni A, PA-C  montelukast (SINGULAIR) 10 MG tablet Take 1 tablet (10 mg total) by mouth at bedtime. 01/14/21   Mercy Riding, MD  Multiple Vitamin (MULTIVITAMIN WITH MINERALS) TABS tablet Take 1 tablet by mouth daily. 01/14/21   Mercy Riding, MD  omeprazole (PRILOSEC) 40 MG capsule TAKE 1 CAPSULE(40 MG) BY MOUTH DAILY Patient taking differently: Take 40 mg by mouth daily. 10/23/20   [name], MD  oxybutynin (DITROPAN) 5 MG tablet TAKE 1 TABLET(5 MG) BY MOUTH TWICE DAILY Patient taking differently: Take 5 mg by mouth 2 (two)  times daily. 10/23/20   [name], MD  potassium chloride SA (KLOR-CON M) 20 MEQ tablet Take 1 tablet (20 mEq total) by mouth daily. 01/17/21 02/16/21  Mercy Riding, MD  prazosin (MINIPRESS) 2 MG capsule Take 1 capsule (2 mg total) by mouth at bedtime. 02/14/21   [name], MD  sodium chloride (OCEAN) 0.65 % SOLN nasal spray Place 2 sprays into both nostrils daily.    [provider]  terbinafine (LAMISIL AT) 1 % cream Apply 1 application topically 2 (two) times daily. Patient taking differently: Apply 1 application topically 3 (three) times daily. 09/07/20   [name], MD  thiamine 100 MG tablet Take 1 tablet (100 mg total) by mouth daily. 02/27/20   Hongalgi, Lenis Dickinson, MD      Allergies    Hydrocodone, Hydroxyzine, Anette Guarneri [lurasidone hcl], Magnesium-containing compounds, Prednisone, Tramadol, Codeine, Trazodone, Topamax [topiramate], Sulfa antibiotics, and Tape    Review of Systems   Review of Systems  Physical Exam Updated Vital Signs BP 130/82 (BP Location: Left Leg)    Pulse 80    Temp 98 F (36.7 C) (Oral)    Resp 18    SpO2 92%  Physical Exam Vitals and nursing note reviewed.  Constitutional:      General: [name] not in acute distress.    Appearance: [name] well-developed. [name] not diaphoretic.  HENT:     Head: Normocephalic and atraumatic.     Right Ear: External ear normal.     Left Ear: External ear normal.     Nose: Nose normal.     Mouth/Throat:     Mouth: Mucous membranes are moist.  Eyes:     General: No scleral icterus.    Conjunctiva/sclera: Conjunctivae normal.  Cardiovascular:     Rate and Rhythm: Normal rate and regular rhythm.     Heart sounds: Normal heart sounds. No murmur heard.   No friction rub. No gallop.  Pulmonary:     Effort: Pulmonary effort is normal. No respiratory distress.     Breath sounds: Normal breath sounds.  Abdominal:     General: Bowel sounds are normal. There is no distension.     Palpations: Abdomen is soft.  There is no mass.     Tenderness: There is no abdominal tenderness. There is no guarding.  Musculoskeletal:     Cervical back: Normal range of motion.     Right lower leg: Edema present.     Left lower leg: Edema present.     Comments: Tense pitting edema bilateral lower extremities up to the knees  Skin:    General: Skin is warm and dry.  Neurological:     Mental Status: [name] alert and oriented to  person, place, and time.  Psychiatric:        Behavior: Behavior normal.    ED Results / Procedures / Treatments   Labs (all labs ordered are listed, but only abnormal results are displayed) Labs Reviewed  CBC - Abnormal; Notable for the following components:      Result Value   RBC 3.35 (*)    Hemoglobin 9.4 (*)    HCT 31.1 (*)    RDW 16.5 (*)    All other components within normal limits  COMPREHENSIVE METABOLIC PANEL - Abnormal; Notable for the following components:   Glucose, Bld 108 (*)    BUN 30 (*)    Creatinine, Ser 1.72 (*)    Calcium 8.8 (*)    Total Protein 6.3 (*)    Alkaline Phosphatase 142 (*)    Total Bilirubin 0.1 (*)    GFR, Estimated 33 (*)    All other components within normal limits  MAGNESIUM  BRAIN NATRIURETIC PEPTIDE  TROPONIN I (HIGH SENSITIVITY)  TROPONIN I (HIGH SENSITIVITY)    EKG None  Radiology DG Chest 2 View  Result Date: 02/24/2021 CLINICAL DATA:  Dyspnea, congestive heart failure EXAM: CHEST - 2 VIEW COMPARISON:  01/12/2021 FINDINGS: Lung volumes are small, but are symmetric and are clear. No pneumothorax or pleural effusion. Cardiac size is at the upper limits of normal. Pulmonary vascularity is normal. Multiple remote healed left rib fractures are noted. IMPRESSION: Pulmonary hypoinflation Electronically Signed   By: Fidela Salisbury M.D.   On: 02/24/2021 22:37    Procedures Procedures    Medications Ordered in ED Medications - No data to display  ED Course/ Medical Decision Making/ A&P                           Medical Decision  Making Risk OTC drugs. Prescription drug management.      Patient here with complaint of leg pain and peripheral edema The differential diagnosis includes consideration of potentially life-threatening and includes complicated medical decision making. Differential diagnosis for peripheral edema includes DVT, cellulitis, lymphedema, venous insufficiency, CHF, medication side effect. Patient has a history of peripheral edema.  [name] on Lasix but states that she has not had significant urinary output.  She also has a history of renal insufficiency.  I reviewed and interpreted the patient's labs which included a CBC which shows baseline anemia, CMP with baseline renal insufficiency without significant change, magnesium troponin and BNP within normal limits.  Patient has no shortness of breath.  Patient given IV Lasix here with significant urinary output and decrease in her leg swelling.  She was given Tylenol for pain.  I ordered and reviewed a chest x-ray and interpreted images.   I considered testing with a DVT study however she has equal bilaterally swelling in the extremities.  She does not have evidence of cellulitis, she has no tachycardia or hypoxia. There is no evidence of pulmonary edema.  I reviewed and interpreted the EKG which shows sinus rhythm at a rate of 89.  On reevaluation the patient's pain and swelling is significantly improved.  She appears otherwise appropriate for discharge at this time.  Discussed return precautions and outpatient follow-up  Final Clinical Impression(s) / ED Diagnoses Final diagnoses:  None    Rx / DC Orders ED Discharge Orders     None         Margarita Mail, PA-C 02/26/21 0117    Margarita Mail, PA-C 02/26/21 0118  Orpah Greek, MD 02/26/21 [phone]

## 2021-02-25 NOTE — Discharge Instructions (Signed)
Contact a health care provider if you have: A fever. Edema that starts suddenly or is getting worse, especially if you are pregnant or have a medical condition. Swelling in only one leg. Increased swelling, redness, or pain in one or both of your legs. Drainage or sores at the area where you have edema. Get help right away if you: Develop shortness of breath, especially when you are lying down. Have pain in your chest or abdomen. Feel weak. Feel faint.
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
## 2021-02-28 ENCOUNTER — Other Ambulatory Visit: Payer: Self-pay | Admitting: Family Medicine

## 2021-02-28 NOTE — Telephone Encounter (Signed)
Medication: diclofenac Sodium (VOLTAREN) 1 % GEL [[id number]]   Has the patient contacted their pharmacy? YES (Agent: If no, request that the patient contact the pharmacy for the refill. If patient does not wish to contact the pharmacy document the reason why and proceed with request.) (Agent: If yes, when and what did the pharmacy advise?)    Preferred Pharmacy (with phone number or street name): [facility] [phone] - [place], [place] - [address] [place] AT [address] [place] [address] Phone: [phone] Fax: [phone] Hours: Not open 24 hours Has the patient been seen for an appointment in the last year OR does the patient have an upcoming appointment? YES 03/23/21 Agent: Please be advised that RX refills may take up to 3 business days. We ask that you follow-up with your pharmacy.

## 2021-03-01 ENCOUNTER — Encounter (HOSPITAL_COMMUNITY): Payer: Self-pay | Admitting: Emergency Medicine

## 2021-03-01 ENCOUNTER — Emergency Department (HOSPITAL_COMMUNITY)
Admission: EM | Admit: 2021-03-01 | Discharge: 2021-03-02 | Disposition: A | Discharge status: Home / Self Care | Payer: Medicare Other | Attending: Emergency Medicine | Admitting: Emergency Medicine

## 2021-03-01 ENCOUNTER — Emergency Department (HOSPITAL_COMMUNITY): Payer: Medicare Other

## 2021-03-01 ENCOUNTER — Other Ambulatory Visit: Payer: Self-pay

## 2021-03-01 DIAGNOSIS — Z7951 Long term (current) use of inhaled steroids: Secondary | ICD-10-CM | POA: Diagnosis not present

## 2021-03-01 DIAGNOSIS — E1140 Type 2 diabetes mellitus with diabetic neuropathy, unspecified: Secondary | ICD-10-CM | POA: Insufficient documentation

## 2021-03-01 DIAGNOSIS — R0602 Shortness of breath: Secondary | ICD-10-CM | POA: Diagnosis not present

## 2021-03-01 DIAGNOSIS — R600 Localized edema: Secondary | ICD-10-CM | POA: Insufficient documentation

## 2021-03-01 DIAGNOSIS — M79661 Pain in right lower leg: Secondary | ICD-10-CM | POA: Diagnosis not present

## 2021-03-01 DIAGNOSIS — M25552 Pain in left hip: Secondary | ICD-10-CM | POA: Diagnosis not present

## 2021-03-01 DIAGNOSIS — R944 Abnormal results of kidney function studies: Secondary | ICD-10-CM | POA: Diagnosis not present

## 2021-03-01 DIAGNOSIS — I130 Hypertensive heart and chronic kidney disease with heart failure and stage 1 through stage 4 chronic kidney disease, or unspecified chronic kidney disease: Secondary | ICD-10-CM | POA: Diagnosis not present

## 2021-03-01 DIAGNOSIS — I517 Cardiomegaly: Secondary | ICD-10-CM | POA: Diagnosis not present

## 2021-03-01 DIAGNOSIS — E876 Hypokalemia: Secondary | ICD-10-CM | POA: Insufficient documentation

## 2021-03-01 DIAGNOSIS — J449 Chronic obstructive pulmonary disease, unspecified: Secondary | ICD-10-CM | POA: Insufficient documentation

## 2021-03-01 DIAGNOSIS — M79662 Pain in left lower leg: Secondary | ICD-10-CM | POA: Diagnosis not present

## 2021-03-01 DIAGNOSIS — N183 Chronic kidney disease, stage 3 unspecified: Secondary | ICD-10-CM | POA: Diagnosis not present

## 2021-03-01 DIAGNOSIS — D649 Anemia, unspecified: Secondary | ICD-10-CM | POA: Insufficient documentation

## 2021-03-01 DIAGNOSIS — Z79899 Other long term (current) drug therapy: Secondary | ICD-10-CM | POA: Diagnosis not present

## 2021-03-01 DIAGNOSIS — I5032 Chronic diastolic (congestive) heart failure: Secondary | ICD-10-CM | POA: Diagnosis not present

## 2021-03-01 DIAGNOSIS — R6889 Other general symptoms and signs: Secondary | ICD-10-CM | POA: Diagnosis not present

## 2021-03-01 DIAGNOSIS — Z743 Need for continuous supervision: Secondary | ICD-10-CM | POA: Diagnosis not present

## 2021-03-01 DIAGNOSIS — M7989 Other specified soft tissue disorders: Secondary | ICD-10-CM | POA: Diagnosis present

## 2021-03-01 DIAGNOSIS — M79604 Pain in right leg: Secondary | ICD-10-CM

## 2021-03-01 LAB — BASIC METABOLIC PANEL
Anion gap: 11 (ref 5–15)
BUN: 28 mg/dL — ABNORMAL HIGH (ref 8–23)
CO2: 24 mmol/L (ref 22–32)
Calcium: 9 mg/dL (ref 8.9–10.3)
Chloride: 105 mmol/L (ref 98–111)
Creatinine, Ser: 1.91 mg/dL — ABNORMAL HIGH (ref 0.44–1.00)
GFR, Estimated: 29 mL/min — ABNORMAL LOW (ref >60)
Glucose, Bld: 93 mg/dL (ref 70–99)
Potassium: 3 mmol/L — ABNORMAL LOW (ref 3.5–5.1)
Sodium: 140 mmol/L (ref 135–145)

## 2021-03-01 LAB — CBC WITH DIFFERENTIAL/PLATELET
Abs Immature Granulocytes: 0.01 10*3/uL (ref 0.00–0.07)
Basophils Absolute: 0 10*3/uL (ref 0.0–0.1)
Basophils Relative: 0 %
Eosinophils Absolute: 0.1 10*3/uL (ref 0.0–0.5)
Eosinophils Relative: 2 %
HCT: 35.3 % — ABNORMAL LOW (ref 36.0–46.0)
Hemoglobin: 10.8 g/dL — ABNORMAL LOW (ref 12.0–15.0)
Immature Granulocytes: 0 %
Lymphocytes Relative: 21 %
Lymphs Abs: 1.3 10*3/uL (ref 0.7–4.0)
MCH: 28.1 pg (ref 26.0–34.0)
MCHC: 30.6 g/dL (ref 30.0–36.0)
MCV: 91.9 fL (ref 80.0–100.0)
Monocytes Absolute: 0.5 10*3/uL (ref 0.1–1.0)
Monocytes Relative: 9 %
Neutro Abs: 3.9 10*3/uL (ref 1.7–7.7)
Neutrophils Relative %: 68 %
Platelets: 306 10*3/uL (ref 150–400)
RBC: 3.84 MIL/uL — ABNORMAL LOW (ref 3.87–5.11)
RDW: 16.2 % — ABNORMAL HIGH (ref 11.5–15.5)
WBC: 5.9 10*3/uL (ref 4.0–10.5)
nRBC: 0 % (ref 0.0–0.2)

## 2021-03-01 LAB — TROPONIN I (HIGH SENSITIVITY): Troponin I (High Sensitivity): 13 ng/L (ref <18)

## 2021-03-01 LAB — BRAIN NATRIURETIC PEPTIDE: B Natriuretic Peptide: 41.6 pg/mL (ref 0.0–100.0)

## 2021-03-01 MED ORDER — FUROSEMIDE 10 MG/ML IJ SOLN
40.0000 mg | Freq: Once | INTRAMUSCULAR | Status: AC
  Administered 2021-03-01: 40 mg via INTRAVENOUS
  Filled 2021-03-01: qty 4

## 2021-03-01 MED ORDER — DICLOFENAC SODIUM 1 % EX GEL: 4.0000 g | Freq: Four times a day (QID) | CUTANEOUS | 0 refills | Status: DC

## 2021-03-01 NOTE — ED Triage Notes (Signed)
Patient arrived by [facility] with chronic bilateral leg pain. Patient uses walker for ambulation at home. No trauma, no fall. Alert and oriented

## 2021-03-01 NOTE — Telephone Encounter (Signed)
Requested medication (s) are due for refill today: yes  Requested medication (s) are on the active medication list: yes  Last refill:  12/28/20 100 g with 0 RF  Future visit scheduled: 03/23/21  Notes to clinic:  Failed protocol of labs within normal range within 360 days, has upcoming appt, please assess.   Requested Prescriptions  Pending Prescriptions Disp Refills   diclofenac Sodium (VOLTAREN) 1 % GEL 100 g 0    Sig: Apply 4 g topically 4 (four) times daily.     Analgesics:  Topicals Failed - 02/28/2021  2:45 PM      Failed - Manual Review: Labs are only required if the patient has taken medication for more than 8 weeks.      Failed - HGB in normal range and within 360 days    Hemoglobin  Date Value Ref Range Status  02/24/2021 9.4 (L) 12.0 - 15.0 g/dL Final  11/25/2019 9.0 (L) 11.1 - 15.9 g/dL Final          Failed - HCT in normal range and within 360 days    HCT  Date Value Ref Range Status  02/24/2021 31.1 (L) 36.0 - 46.0 % Final   Hematocrit  Date Value Ref Range Status  11/25/2019 28.2 (L) 34.0 - 46.6 % Final          Failed - Cr in normal range and within 360 days    Creat  Date Value Ref Range Status  02/18/2016 1.22 (H) 0.50 - 1.05 mg/dL Final    Comment:      For patients > or = 62 years of age: The upper reference limit for Creatinine is approximately 13% higher for people identified as African-American.      Creatinine, Ser  Date Value Ref Range Status  02/24/2021 1.72 (H) 0.44 - 1.00 mg/dL Final   Creatinine,U  Date Value Ref Range Status  06/17/2012 521.94 mg/dL Final    Comment:    Result confirmed by automatic dilution.    Cutoff Values for Urine Drug Screen, Pain Mgmt          Drug Class           Cutoff (ng/mL)          Amphetamines             500          Barbiturates             200          Cocaine Metabolites      150          Benzodiazepines          200          Methadone                300          Opiates                   300          Phencyclidine             25          Propoxyphene             300          Marijuana Metabolites     50    For medical purposes only.   Creatinine, Urine  Date Value Ref Range Status  02/19/2020 300.84 mg/dL Final  Comment:    Performed at [facility], [place] [address] [place], [place] [address]          Passed - PLT in normal range and within 360 days    Platelets  Date Value Ref Range Status  02/24/2021 302 150 - 400 K/uL Final  11/25/2019 375 150 - 450 x10E3/uL Final          Passed - eGFR is 30 or above and within 360 days    GFR, Est African American  Date Value Ref Range Status  02/18/2016 57 (L) >=60 mL/min Final   GFR calc Af Amer  Date Value Ref Range Status  11/25/2019 56 (L) >59 mL/min/1.73 Final    Comment:    **In accordance with recommendations from the NKF-ASN Task force,**   [facility] is in the process of updating its eGFR calculation to the   2021 CKD-EPI creatinine equation that estimates kidney function   without a race variable.    GFR, Est Non African American  Date Value Ref Range Status  02/18/2016 50 (L) >=60 mL/min Final   GFR, Estimated  Date Value Ref Range Status  02/24/2021 33 (L) >60 mL/min Final    Comment:    (NOTE) Calculated using the CKD-EPI Creatinine Equation (2021)    eGFR  Date Value Ref Range Status  07/13/2020 21 (L) >59 mL/min/1.73 Final          Passed - Patient is not pregnant      Passed - Valid encounter within last 12 months    Recent Outpatient Visits           1 week ago Edema of both legs   [place], Vernia Buff, NP   1 month ago Hospital discharge follow-up   [place] Gildardo Pounds, NP   1 month ago Pain in both lower extremities   [place], [place]ong, MD   3 months ago Adnexal cyst   [place], Charlane Ferretti, MD   3 months  ago Neuropathy   [name], [place], [place]       Future Appointments             In 1 week [place] Rile, Martie Lee, NP [facility]   In 3 weeks [name], MD [facility]   In 1 month [name], MD [place]
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
## 2021-03-01 NOTE — ED Provider Notes (Signed)
[facility] EMERGENCY DEPARTMENT Provider Note   CSN: [id number] Arrival date & time: 03/01/21  1642     History Chief Complaint  Patient presents with   Leg Pain    Tiffany [name] is a 61 y.o. female with history of below diagnoses who presents to the emergency department with bilateral leg swelling.  This seems to be a chronic problem.  Patient was seen and evaluated in the emergency department 4 days ago for similar symptoms.  She was given furosemide at that time had adequate urine output and told to follow-up with her primary care provider.  Patient states that she ran out of her torsemide as she took her last dose today and swelling has been worsening.  She does endorse mild shortness of breath.  No chest pain, abdominal pain, nausea, vomiting, diarrhea.  She states that her legs feel heavy and are painful and are severe to the point where she is unable to walk.  No orthopnea.  Patient Active Problem List   Diagnosis Date Noted   Atypical chest pain 01/13/2021   GERD (gastroesophageal reflux disease)    Neuropathy 11/18/2020   Pain in both feet 11/18/2020   Localized edema 11/18/2020   Encounter for medication management 11/17/2020   Protein-calorie malnutrition, severe 05/15/2020   Malnutrition of moderate degree 38/93/7342   Periumbilical abdominal pain    Dysphagia    Nausea vomiting and diarrhea    Generalized abdominal pain    E. coli UTI 87/68/1157   Acute metabolic encephalopathy 26/20/3559   Psychosis ([place]) 02/20/2020   Schizophrenia, schizo-affective ([place]) 74/16/3845   Metabolic acidosis, normal anion gap (NAG) 02/20/2020   Altered mental status    Hypocalcemia    AKI (acute kidney injury) ([place]) 02/18/2020   Pelvic mass in female 01/02/2020   Genital herpes 11/25/2019   Stage 2 skin ulcer of sacral region ([place]) 10/28/2019   ARF (acute renal failure) ([place]) 10/27/2019   Family discord 02/04/2019   Aggressive behavior    PTSD (post-traumatic  stress disorder) 05/27/2018   Schizoaffective disorder, bipolar type ([place]) 04/05/2018   Closed displaced fracture of fifth metacarpal bone 03/21/2018   Difficulty with speech 01/24/2018   Encephalopathy 11/21/2017   Drug abuse ([place]) 11/21/2017   Frequent falls 10/11/2017   Binge eating disorder    Dependence on continuous supplemental oxygen 05/14/2017   Gout 04/11/2017   Hypomagnesemia    CKD (chronic kidney disease) stage 3, GFR 30-59 ml/min (HCC) 12/15/2016   Carotid artery stenosis    Hypokalemia    Osteoarthritis 10/26/2016   Anoxic brain injury ([place]) 09/08/2016   Overactive bladder 06/07/2016   QT prolongation    OSA and COPD overlap syndrome ([place])    Arthritis    Essential hypertension 03/22/2016   Cocaine use disorder, severe, in sustained remission ([place]) 12/17/2015   History of drug abuse in remission ([place]) 11/28/2015   Hyponatremia 11/25/2015   Chronic diastolic congestive heart failure (HCC)    Chronic respiratory failure with hypoxia ([place]) 06/22/2015   Tobacco use disorder 07/22/2014   COPD (chronic obstructive pulmonary disease) ([place]) 07/08/2014   Seizure ([place]) 01/04/2013   Chronic pain syndrome 06/18/2012   Dyslipidemia 04/24/2011   Anemia 04/24/2011   Diabetic neuropathy ([place]) 04/24/2011   Morbid obesity ([place]) 10/18/2010   Type 2 diabetes mellitus ([place]) 10/18/2010      Leg Pain     Home Medications Prior to Admission medications   Medication Sig Start Date End Date Taking? Authorizing Provider  albuterol (PROVENTIL) (2.5 MG/3ML) 0.083% nebulizer solution Take 3 mLs (2.5 mg total) by nebulization every 6 (six) hours as needed for wheezing or shortness of breath. 12/16/20   [name], MD  albuterol (VENTOLIN HFA) 108 (90 Base) MCG/ACT inhaler Inhale 2 puffs into the lungs every 6 (six) hours as needed for wheezing or shortness of breath. 02/09/21   [name], MD  amLODipine (NORVASC) 10 MG tablet Take 1 tablet (10 mg total) by mouth daily.  02/09/21   [name], MD  asenapine (SAPHRIS) 5 MG SUBL 24 hr tablet Place 2 tablets (10 mg total) under the tongue 2 (two) times daily. 01/14/21   Mercy Riding, MD  atorvastatin (LIPITOR) 20 MG tablet Take 1 tablet (20 mg total) by mouth at bedtime. 01/14/21   Mercy Riding, MD  colchicine 0.6 MG tablet Take 2 tablets by mouth at the onset of gout flare, may repeat 1 tablet in 1 hour if symptoms persist 01/14/21   Mercy Riding, MD  cyanocobalamin 1000 MCG tablet Take 1 tablet (1,000 mcg total) by mouth daily. 01/14/21   Mercy Riding, MD  cyclobenzaprine (FLEXERIL) 10 MG tablet Take 1 tablet (10 mg total) by mouth 2 (two) times daily as needed. 02/09/21   [name], MD  diclofenac (VOLTAREN) 75 MG EC tablet TAKE 1 TABLET (75 MG) BY MOUTH TWICE DAILY AS NEEDED 02/09/21   [name], MD  diclofenac Sodium (VOLTAREN) 1 % GEL Apply 4 g topically 4 (four) times daily. 03/01/21   [name], MD  dicyclomine (BENTYL) 10 MG capsule Take 1 capsule (10 mg total) by mouth 3 (three) times daily before meals. 12/24/20   [name], MD  escitalopram (LEXAPRO) 10 MG tablet Take 1 tablet (10 mg total) by mouth daily. 02/09/21 03/11/21  [name], MD  febuxostat (ULORIC) 40 MG tablet Take 1 tablet (40 mg total) by mouth daily. 02/18/21   [name], MD  Fluticasone-Umeclidin-Vilant (TRELEGY ELLIPTA) 100-62.5-25 MCG/ACT AEPB Inhale 1 puff into the lungs daily. 01/14/21   Mercy Riding, MD  furosemide (LASIX) 40 MG tablet Take 1 tablet (40 mg total) by mouth daily. 01/17/21 03/18/21  Mercy Riding, MD  gabapentin (NEURONTIN) 600 MG tablet Take 1 tablet (600 mg total) by mouth 2 (two) times daily. 02/09/21   [name], MD  Iron, Ferrous Sulfate, 325 (65 Fe) MG TABS Take 325 mg by mouth daily. 01/14/21   Mercy Riding, MD  ketoconazole (NIZORAL) 2 % cream Apply 1 application topically daily. Apply to both feet 09/27/20   [provider]  lidocaine (LIDODERM) 5 % Place 1 patch onto  the skin daily. Remove & Discard patch within 12 hours or as directed by MD 12/26/20   Henderly, Britni A, PA-C  montelukast (SINGULAIR) 10 MG tablet Take 1 tablet (10 mg total) by mouth at bedtime. 01/14/21   Mercy Riding, MD  Multiple Vitamin (MULTIVITAMIN WITH MINERALS) TABS tablet Take 1 tablet by mouth daily. 01/14/21   Mercy Riding, MD  omeprazole (PRILOSEC) 40 MG capsule TAKE 1 CAPSULE(40 MG) BY MOUTH DAILY Patient taking differently: Take 40 mg by mouth daily. 10/23/20   [name], MD  oxybutynin (DITROPAN) 5 MG tablet TAKE 1 TABLET(5 MG) BY MOUTH TWICE DAILY Patient taking differently: Take 5 mg by mouth 2 (two) times daily. 10/23/20   [name], MD  potassium chloride SA (KLOR-CON M) 20 MEQ tablet Take 1 tablet (20 mEq total) by mouth daily. 01/17/21 02/16/21  Mercy Riding, MD  prazosin (MINIPRESS) 2 MG capsule Take 1 capsule (2 mg total) by mouth at bedtime. 02/14/21   [name], MD  sodium chloride (OCEAN) 0.65 % SOLN nasal spray Place 2 sprays into both nostrils daily.    [provider]  terbinafine (LAMISIL AT) 1 % cream Apply 1 application topically 2 (two) times daily. Patient taking differently: Apply 1 application topically 3 (three) times daily. 09/07/20   [name], MD  thiamine 100 MG tablet Take 1 tablet (100 mg total) by mouth daily. 02/27/20   Hongalgi, Lenis Dickinson, MD  torsemide (DEMADEX) 20 MG tablet Take 1 tablet (20 mg total) by mouth daily. 02/25/21   Margarita Mail, PA-C      Allergies    Hydrocodone, Hydroxyzine, Latuda [lurasidone hcl], Magnesium-containing compounds, Prednisone, Tramadol, Codeine, Trazodone, Topamax [topiramate], Sulfa antibiotics, and Tape    Review of Systems   Review of Systems  All other systems reviewed and are negative.  Physical Exam Updated Vital Signs BP (!) 113/102 (BP Location: Right Arm)    Pulse 78    Temp (!) 97.5 F (36.4 C) (Oral)    Resp 19    SpO2 100%  Physical Exam Vitals and nursing note  reviewed.  Constitutional:      General: [name] not in acute distress.    Appearance: Normal appearance.  HENT:     Head: Normocephalic and atraumatic.  Eyes:     General:        Right eye: No discharge.        Left eye: No discharge.  Cardiovascular:     Comments: Regular rate and rhythm.  S1/S2 are distinct without any evidence of murmur, rubs, or gallops.  Radial pulses are 2+ bilaterally.  Dorsalis pedis pulses are 2+ bilaterally. Pulmonary:     Comments: Clear to auscultation bilaterally.  Normal effort.  No respiratory distress.  No evidence of wheezes, rales, or rhonchi heard throughout. Abdominal:     General: Abdomen is flat. Bowel sounds are normal. There is no distension.     Tenderness: There is no abdominal tenderness. There is no guarding or rebound.  Musculoskeletal:        General: Normal range of motion.     Cervical back: Neck supple.     Right lower leg: Pitting Edema present.     Left lower leg: Pitting Edema present.  Skin:    General: Skin is warm and dry.     Findings: No rash.  Neurological:     General: No focal deficit present.     Mental Status: [name] alert.  Psychiatric:        Mood and Affect: Mood normal.        Behavior: Behavior normal.    ED Results / Procedures / Treatments   Labs (all labs ordered are listed, but only abnormal results are displayed) Labs Reviewed  CBC WITH DIFFERENTIAL/PLATELET - Abnormal; Notable for the following components:      Result Value   RBC 3.84 (*)    Hemoglobin 10.8 (*)    HCT 35.3 (*)    RDW 16.2 (*)    All other components within normal limits  BASIC METABOLIC PANEL - Abnormal; Notable for the following components:   Potassium 3.0 (*)    BUN 28 (*)    Creatinine, Ser 1.91 (*)    GFR, Estimated 29 (*)    All other components within normal limits  BRAIN NATRIURETIC PEPTIDE  TROPONIN I (  HIGH SENSITIVITY)  TROPONIN I (HIGH SENSITIVITY)    EKG None  Radiology DG Chest 2 View  Result Date:  03/01/2021 CLINICAL DATA:  Shortness of breath. EXAM: CHEST - 2 VIEW COMPARISON:  Chest radiograph dated 02/24/2021. FINDINGS: No focal consolidation, pleural effusion, pneumothorax. Cardiomegaly. No acute osseous pathology. Degenerative changes of the spine. IMPRESSION: No active cardiopulmonary disease. Cardiomegaly. Electronically Signed   By: Anner Crete M.D.   On: 03/01/2021 21:50   DG Pelvis 1-2 Views  Result Date: 03/01/2021 CLINICAL DATA:  Bilateral hip pain EXAM: PELVIS - 1-2 VIEW COMPARISON:  07/09/2016 FINDINGS: There is no evidence of pelvic fracture or diastasis on this single view. Mild degenerative changes in the bilateral acetabula. No pelvic bone lesions are seen. IMPRESSION: No acute osseous abnormality. Electronically Signed   By: Merilyn Baba M.D.   On: 03/01/2021 17:39    Procedures Procedures   Medications Ordered in ED Medications  furosemide (LASIX) injection 40 mg (40 mg Intravenous Given 03/01/21 2234)    ED Course/ Medical Decision Making/ A&P                           Medical Decision Making Amount and/or Complexity of Data Reviewed Labs: ordered. Radiology: ordered.  Risk Prescription drug management.   SABEL HORNBECK is a 61 y.o. female with significant comorbidities to impact her care today to include COPD, chronic pain syndrome, type 2 diabetes who presents the emergency department with chronic leg pain and swelling.  It is unclear whether or not the patient is truly taking her torsemide.  Her legs do appear swollen but do not appear cellulitic in nature.  Differential diagnosis does include possible heart failure, lymphedema, venous insufficiency.  I have a low suspicion for ACS at this time.  I will plan to get basic labs including BNP and a chest x-ray and EKG.  I will give her 1 round of IV Lasix 40 mg and will plan to reevaluate.  I personally reviewed the labs and imaging.  CBC showed ongoing anemia.  Initial troponin was negative.  BNP was  normal.  I doubt heart failure at this time.  BMP shows mild hypokalemia and elevated creatinine.  This seems to be uptrending.  Delta troponin is pending.  Chest x-ray showed cardiomegaly but no overt signs of pleural effusions or pneumonia.  I do agree with the radiologist interpretation.  I gave her 40 of Lasix.  EKG was ordered at 2129 and still yet to be performed.  Rest of her work-up is still pending with regard to the delta troponin.  The rest of her care will be transferred to [facility].  If delta troponin is negative I do believe patient would be safe for the outpatient setting.   Final Clinical Impression(s) / ED Diagnoses Final diagnoses:  None    Rx / DC Orders ED Discharge Orders     None         Cherrie Gauze 03/01/21 2349    Lacretia Leigh, MD 03/03/21 1851

## 2021-03-01 NOTE — ED Provider Triage Note (Signed)
Emergency Medicine Provider Triage Evaluation Note  Tiffany [name] , a 61 y.o. female  was evaluated in triage.  Pt complains of lower extremity pain.  She has pain in her hips, low back, lower extremities bilaterally.  She this is a chronic problem, states it worsened about a week ago.  Feels like her legs are heavy, there is a shooting pain whenever tries to ambulate, at baseline she uses a walker.  Review of Systems  Positive: GAIT PROBLEM Negative:   Physical Exam  BP (!) 160/75 (BP Location: Left Arm)    Pulse 78    Temp 98.5 F (36.9 C) (Oral)    Resp 18    SpO2 100%  Gen:   Awake, no distress   Resp:  Normal effort  MSK:   Patient tolerates range of the ankles, knees bilaterally.  There is no significant midline lumbar tenderness.  Does have tenderness to the pelvis bilaterally but not specifically at thE femoral head.  DP and PT are 2+. Other:  Bilateral pitting edema  Medical Decision Making  Medically screening exam initiated at 4:53 PM.  Appropriate orders placed.  KENLEIGH TOBACK was informed that the remainder of the evaluation will be completed by another provider, this initial triage assessment does not replace that evaluation, and the importance of remaining in the ED until their evaluation is complete.  Per chart review patient's been seen for this in the past.  Given she is having some tenderness over the pelvic area I will proceed with an imaging there.  Do not feel strongly she needs labs at this time as this appears to be an acute on chronic exacerbation.  Patient may physical exam disorder pelvis x-rays to start.   Sherrill Raring, PA-C 03/01/21 1654

## 2021-03-02 DIAGNOSIS — Z7401 Bed confinement status: Secondary | ICD-10-CM | POA: Diagnosis not present

## 2021-03-02 DIAGNOSIS — R600 Localized edema: Secondary | ICD-10-CM | POA: Diagnosis not present

## 2021-03-02 DIAGNOSIS — R404 Transient alteration of awareness: Secondary | ICD-10-CM | POA: Diagnosis not present

## 2021-03-02 MED ORDER — ACETAMINOPHEN 500 MG PO TABS: 1000.0000 mg | ORAL_TABLET | Freq: Once | ORAL | Status: DC

## 2021-03-02 MED ORDER — LORAZEPAM 2 MG/ML IJ SOLN
1.0000 mg | Freq: Once | INTRAMUSCULAR | Status: AC
  Administered 2021-03-02: 1 mg via INTRAVENOUS
  Filled 2021-03-02: qty 1
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
## 2021-03-02 NOTE — ED Notes (Addendum)
Pt became angry that we had not brang her food yet, pt informed that there is a NPO order which means that she cannot eat or drink. We will ask the doctor but it has not been done yet, pt began cussing and yelling at staff telling us to "get the fuck out of my face" and refusing the staff to help with her care. EDP notified

## 2021-03-02 NOTE — ED Notes (Signed)
Pt continues to look at this tech and stick up her middle finger.
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
## 2021-03-02 NOTE — Discharge Instructions (Signed)
Follow-up with your primary care doctor. °Return here for new concerns. °

## 2021-03-02 NOTE — ED Notes (Signed)
Pt yelling, "get out of my face, get her black ass out my face. Dumb ass bitch, get the charge nurse." PT continuing to yell out at nursing staff and for the charge nurse to come speak to her. PT was asked to calm down to allow for an EKG to be done, PT would not allow for labs or EKG to be done.
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
## 2021-03-03 ENCOUNTER — Ambulatory Visit: Payer: Self-pay

## 2021-03-03 NOTE — Telephone Encounter (Signed)
° ° °  Chief Complaint: Bilateral leg pain Symptoms: Pain and swelling to both legs. Seen in ED 03/11/21. Frequency: Started 3 weeks ago Pertinent Negatives: Patient denies fever Disposition: [] ED /[] Urgent Care (no appt availability in office) / [] Appointment(In office/virtual)/ []  [place] [facility]/ [] Home Care/ [] Refused Recommended Disposition /[]  Mobile Bus/ []  Follow-up with PCP Additional Notes: Pt. Asking to be seen as soon as possible. Please advise.  Answer Assessment - Initial Assessment Questions 1. ONSET: "When did the pain start?"      3 weeks 2. LOCATION: "Where is the pain located?"      Both legs 3. PAIN: "How bad is the pain?"    (Scale 1-10; or mild, moderate, severe)   -  MILD (1-3): doesn't interfere with normal activities    -  MODERATE (4-7): interferes with normal activities (e.g., work or school) or awakens from sleep, limping    -  SEVERE (8-10): excruciating pain, unable to do any normal activities, unable to walk     9-10 4. WORK OR EXERCISE: "Has there been any recent work or exercise that involved this part of the body?"      No 5. CAUSE: "What do you think is causing the leg pain?"     Unsure 6. OTHER SYMPTOMS: "Do you have any other symptoms?" (e.g., chest pain, back pain, breathing difficulty, swelling, rash, fever, numbness, weakness)     Swelling to legs up to knee 7. PREGNANCY: "Is there any chance you are pregnant?" "When was your last menstrual period?"     No  Protocols used: Leg Pain-A-AH

## 2021-03-03 NOTE — Telephone Encounter (Signed)
Patient called, ringing busy.    Summary: Leg pain-Unable to walk   Pt states she has an appt at noon and wanted to make sure she doesn't miss or get a call back at that time   ----- Message from Valere Dross sent at 03/03/2021 11:10 AM EST -----  Pt called in but hung up when calling NT, pt states she is unable to walk, and is having some pain in her legs, she did call on [phone] Please advise.

## 2021-03-04 ENCOUNTER — Telehealth: Payer: Self-pay

## 2021-03-04 ENCOUNTER — Ambulatory Visit: Payer: Self-pay | Admitting: *Deleted

## 2021-03-04 NOTE — Telephone Encounter (Signed)
Copied from [place] [id number]. Topic: General - Other >> Mar 04, 2021 12:23 PM Tessa Lerner A wrote: Reason for CRM: The patient has called to check on the status of their request for a prescription for a wheelchair sent to [facility] [place]   [employer] can be reached via phone at [phone] please ask for Raquel Sarna    Please contact [employer] and the patient further when possible

## 2021-03-04 NOTE — Telephone Encounter (Signed)
Noted pt has appointment set.

## 2021-03-04 NOTE — Telephone Encounter (Signed)
Summary: swollen legs   Patient would like a follow up call due to leg swollen and retaining fluid, patient states she can not bear weight and her balance is off. Sent practice a message regarding a hospital follow up.Patient would like if nurse can call within in a hour because she will not be home       Called patient to review leg swelling. No answer, busy signal. Unable to leave message.

## 2021-03-04 NOTE — Telephone Encounter (Signed)
° ° ° °  Summary: swollen legs   Patient would like a follow up call due to leg swollen and retaining fluid, patient states she can not bear weight and her balance is off. Sent practice a message regarding a hospital follow up.Patient would like if nurse can call within in a hour because she will not be home        Chief Complaint: bilateral leg swelling, requesting appt  Symptoms: right leg swollen and oozing clear fluid, left leg swollen greater than right. Bilateral legs swollen from feet to thighs. Difficulty bearing weight  Frequency: x 2 weeks  Pertinent Negatives: Patient denies chest pain difficulty breathing  Disposition: [x] ED /[x] Urgent Care (no appt availability in office) / [] Appointment(In office/virtual)/ []  [place] [place] [facility]/ [] Home Care/ [] Refused Recommended Disposition /[] [place] [facility]/ []  Follow-up with PCP Additional Notes: refused ED due to ED has told patient to f/u with PCP and there is nothing more they can do for her. Appt scheduled for 03/07/21.       Reason for Disposition  SEVERE leg swelling (e.g., swelling extends above knee, entire leg is swollen, weeping fluid)  Answer Assessment - Initial Assessment Questions 1. ONSET: "When did the swelling start?" (e.g., minutes, hours, days)     2 weeks  2. LOCATION: "What part of the leg is swollen?"  "Are both legs swollen or just one leg?"     Feet to thighs  3. SEVERITY: "How bad is the swelling?" (e.g., localized; mild, moderate, severe)  - Localized - small area of swelling localized to one leg  - MILD pedal edema - swelling limited to foot and ankle, pitting edema < 1/4 inch (6 mm) deep, rest and elevation eliminate most or all swelling  - MODERATE edema - swelling of lower leg to knee, pitting edema > 1/4 inch (6 mm) deep, rest and elevation only partially reduce swelling  - SEVERE edema - swelling extends above knee, facial or hand swelling present      Severe- oozing clear fluid from right leg.  But left leg more swollen more  4. REDNESS: "Does the swelling look red or infected?"     na 5. PAIN: "Is the swelling painful to touch?" If Yes, ask: "How painful is it?"   (Scale 1-10; mild, moderate or severe)     yes 6. FEVER: "Do you have a fever?" If Yes, ask: "What is it, how was it measured, and when did it start?"      na 7. CAUSE: "What do you think is causing the leg swelling?"     Not sure  8. MEDICAL HISTORY: "Do you have a history of heart failure, kidney disease, liver failure, or cancer?"     See hx  9. RECURRENT SYMPTOM: "Have you had leg swelling before?" If Yes, ask: "When was the last time?" "What happened that time?"     Yes  10. OTHER SYMPTOMS: "Do you have any other symptoms?" (e.g., chest pain, difficulty breathing)       Denies  11. PREGNANCY: "Is there any chance you are pregnant?" "When was your last menstrual period?"       na  Protocols used: Leg Swelling and Edema-A-AH

## 2021-03-04 NOTE — Telephone Encounter (Signed)
Pt has appointment set for 03/23/2021

## 2021-03-07 ENCOUNTER — Ambulatory Visit: Payer: Medicare Other | Admitting: Nurse Practitioner

## 2021-03-07 NOTE — Telephone Encounter (Signed)
Call placed to [facility].  She explained that they are waiting for a packet of information that was faxed to Dr Margarita Rana on 03/01/2021 to sign.  Informed her that we have not received the fax and she said she will re-fax.

## 2021-03-08 ENCOUNTER — Telehealth: Payer: Self-pay

## 2021-03-08 NOTE — Telephone Encounter (Signed)
CMA awaiting paperwork to be re faxed.

## 2021-03-09 ENCOUNTER — Ambulatory Visit (INDEPENDENT_AMBULATORY_CARE_PROVIDER_SITE_OTHER): Payer: Medicare Other | Admitting: Primary Care

## 2021-03-10 ENCOUNTER — Other Ambulatory Visit: Payer: Self-pay

## 2021-03-10 ENCOUNTER — Encounter (HOSPITAL_COMMUNITY): Payer: Self-pay | Admitting: Emergency Medicine

## 2021-03-10 ENCOUNTER — Emergency Department (HOSPITAL_COMMUNITY): Payer: Medicare Other

## 2021-03-10 ENCOUNTER — Ambulatory Visit: Payer: Self-pay | Admitting: *Deleted

## 2021-03-10 ENCOUNTER — Emergency Department (HOSPITAL_COMMUNITY)
Admission: EM | Admit: 2021-03-10 | Discharge: 2021-03-11 | Disposition: A | Discharge status: Home / Self Care | Payer: Medicare Other | Attending: Emergency Medicine | Admitting: Emergency Medicine

## 2021-03-10 ENCOUNTER — Ambulatory Visit: Payer: Self-pay

## 2021-03-10 ENCOUNTER — Ambulatory Visit: Payer: Medicare Other | Admitting: Nurse Practitioner

## 2021-03-10 DIAGNOSIS — F22 Delusional disorders: Secondary | ICD-10-CM | POA: Diagnosis not present

## 2021-03-10 DIAGNOSIS — D75839 Thrombocytosis, unspecified: Secondary | ICD-10-CM | POA: Diagnosis not present

## 2021-03-10 DIAGNOSIS — R609 Edema, unspecified: Secondary | ICD-10-CM | POA: Diagnosis not present

## 2021-03-10 DIAGNOSIS — Z79899 Other long term (current) drug therapy: Secondary | ICD-10-CM | POA: Diagnosis not present

## 2021-03-10 DIAGNOSIS — R7989 Other specified abnormal findings of blood chemistry: Secondary | ICD-10-CM | POA: Diagnosis not present

## 2021-03-10 DIAGNOSIS — R600 Localized edema: Secondary | ICD-10-CM | POA: Diagnosis not present

## 2021-03-10 DIAGNOSIS — R0602 Shortness of breath: Secondary | ICD-10-CM | POA: Diagnosis not present

## 2021-03-10 DIAGNOSIS — I10 Essential (primary) hypertension: Secondary | ICD-10-CM | POA: Diagnosis not present

## 2021-03-10 DIAGNOSIS — R748 Abnormal levels of other serum enzymes: Secondary | ICD-10-CM | POA: Diagnosis not present

## 2021-03-10 DIAGNOSIS — Z743 Need for continuous supervision: Secondary | ICD-10-CM | POA: Diagnosis not present

## 2021-03-10 DIAGNOSIS — R2232 Localized swelling, mass and lump, left upper limb: Secondary | ICD-10-CM | POA: Diagnosis present

## 2021-03-10 LAB — COMPREHENSIVE METABOLIC PANEL
ALT: 35 U/L (ref 0–44)
AST: 51 U/L — ABNORMAL HIGH (ref 15–41)
Albumin: 4.2 g/dL (ref 3.5–5.0)
Alkaline Phosphatase: 193 U/L — ABNORMAL HIGH (ref 38–126)
Anion gap: 7 (ref 5–15)
BUN: 23 mg/dL (ref 8–23)
CO2: 24 mmol/L (ref 22–32)
Calcium: 9.7 mg/dL (ref 8.9–10.3)
Chloride: 110 mmol/L (ref 98–111)
Creatinine, Ser: 1.58 mg/dL — ABNORMAL HIGH (ref 0.44–1.00)
GFR, Estimated: 37 mL/min — ABNORMAL LOW (ref >60)
Glucose, Bld: 78 mg/dL (ref 70–99)
Potassium: 3.9 mmol/L (ref 3.5–5.1)
Sodium: 141 mmol/L (ref 135–145)
Total Bilirubin: 0.5 mg/dL (ref 0.3–1.2)
Total Protein: 7.9 g/dL (ref 6.5–8.1)

## 2021-03-10 LAB — CBC WITH DIFFERENTIAL/PLATELET
Abs Immature Granulocytes: 0.02 10*3/uL (ref 0.00–0.07)
Basophils Absolute: 0.1 10*3/uL (ref 0.0–0.1)
Basophils Relative: 1 %
Eosinophils Absolute: 0.2 10*3/uL (ref 0.0–0.5)
Eosinophils Relative: 2 %
HCT: 38.2 % (ref 36.0–46.0)
Hemoglobin: 11.6 g/dL — ABNORMAL LOW (ref 12.0–15.0)
Immature Granulocytes: 0 %
Lymphocytes Relative: 28 %
Lymphs Abs: 2 10*3/uL (ref 0.7–4.0)
MCH: 28 pg (ref 26.0–34.0)
MCHC: 30.4 g/dL (ref 30.0–36.0)
MCV: 92 fL (ref 80.0–100.0)
Monocytes Absolute: 0.5 10*3/uL (ref 0.1–1.0)
Monocytes Relative: 7 %
Neutro Abs: 4.7 10*3/uL (ref 1.7–7.7)
Neutrophils Relative %: 62 %
Platelets: 415 10*3/uL — ABNORMAL HIGH (ref 150–400)
RBC: 4.15 MIL/uL (ref 3.87–5.11)
RDW: 16.1 % — ABNORMAL HIGH (ref 11.5–15.5)
WBC: 7.4 10*3/uL (ref 4.0–10.5)
nRBC: 0 % (ref 0.0–0.2)

## 2021-03-10 LAB — BRAIN NATRIURETIC PEPTIDE: B Natriuretic Peptide: 48 pg/mL (ref 0.0–100.0)

## 2021-03-10 LAB — TROPONIN I (HIGH SENSITIVITY)
Troponin I (High Sensitivity): 13 ng/L (ref <18)
Troponin I (High Sensitivity): 13 ng/L (ref <18)

## 2021-03-10 MED ORDER — PRAZOSIN HCL 1 MG PO CAPS
2.0000 mg | ORAL_CAPSULE | Freq: Once | ORAL | Status: AC
  Administered 2021-03-10: 2 mg via ORAL
  Filled 2021-03-10: qty 2

## 2021-03-10 MED ORDER — FUROSEMIDE 10 MG/ML IJ SOLN
40.0000 mg | Freq: Once | INTRAMUSCULAR | Status: AC
  Administered 2021-03-10: 40 mg via INTRAVENOUS
  Filled 2021-03-10: qty 4

## 2021-03-10 MED ORDER — ACETAMINOPHEN 325 MG PO TABS
650.0000 mg | ORAL_TABLET | Freq: Once | ORAL | Status: AC
  Administered 2021-03-10: 650 mg via ORAL
  Filled 2021-03-10: qty 2

## 2021-03-10 MED ORDER — AMLODIPINE BESYLATE 5 MG PO TABS
10.0000 mg | ORAL_TABLET | Freq: Once | ORAL | Status: AC
  Administered 2021-03-10: 10 mg via ORAL
  Filled 2021-03-10: qty 2

## 2021-03-10 NOTE — Telephone Encounter (Signed)
FYI  Pt has appointment on 03/23/2021
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
## 2021-03-10 NOTE — ED Notes (Signed)
Tiffany [name], [place] aware that staff has been unsuccessful in starting peripheral IV and IV consult order is in.

## 2021-03-10 NOTE — ED Notes (Signed)
Dr. Sherry Ruffing aware pt still needs peripheral IV.

## 2021-03-10 NOTE — ED Notes (Signed)
IV team at bedside, patient yelling at staff.  Boundaries set, patient agreed to let IV team get IV. Patient currently calm at this time.
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
## 2021-03-10 NOTE — Discharge Instructions (Addendum)
Take your medications as scheduled
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
## 2021-03-10 NOTE — ED Notes (Signed)
Patient given ham sandwich and ginger ale per request.

## 2021-03-10 NOTE — ED Triage Notes (Signed)
BIBA Per EMS: Pt coming from home c/o bilateral leg and arm swelling and pain x 2 weeks.  VSS  160/90 90 HR  18 RR  98% RA

## 2021-03-11 ENCOUNTER — Encounter (HOSPITAL_BASED_OUTPATIENT_CLINIC_OR_DEPARTMENT_OTHER): Payer: Self-pay

## 2021-03-11 ENCOUNTER — Ambulatory Visit (HOSPITAL_BASED_OUTPATIENT_CLINIC_OR_DEPARTMENT_OTHER): Payer: Medicaid Other | Admitting: Family

## 2021-03-11 NOTE — ED Notes (Signed)
Patient's bed linens changed d/t incontinence, purewick readjusted.

## 2021-03-11 NOTE — Telephone Encounter (Signed)
She needs to go to the ED. An earlier appointment with the mobile unit can be secured.

## 2021-03-14 ENCOUNTER — Telehealth: Payer: Self-pay | Admitting: Family Medicine

## 2021-03-14 NOTE — Telephone Encounter (Signed)
Paperwork has been received and is awaiting PCP signature.

## 2021-03-14 NOTE — Telephone Encounter (Signed)
Copied from [place] [phone]. Topic: General - Other >> Mar 14, 2021 12:36 PM Wynetta Emery, [place] C wrote: Reason for CRM: pt called in to follow up on receipt of paperwork. Pt says that she was told by [employer] to follow up because they haven't received. Pt says that the paperwork is for her wheelchair.   Pt would like further assistance.

## 2021-03-14 NOTE — Telephone Encounter (Signed)
Noted  

## 2021-03-15 ENCOUNTER — Ambulatory Visit (HOSPITAL_BASED_OUTPATIENT_CLINIC_OR_DEPARTMENT_OTHER): Payer: Medicaid Other | Admitting: Family

## 2021-03-15 DIAGNOSIS — G894 Chronic pain syndrome: Secondary | ICD-10-CM | POA: Diagnosis not present

## 2021-03-15 NOTE — Telephone Encounter (Signed)
Pt made an additional call regarding paperwork/ forms for her wheelchair, FYI

## 2021-03-16 ENCOUNTER — Other Ambulatory Visit: Payer: Medicare Other

## 2021-03-16 ENCOUNTER — Other Ambulatory Visit: Payer: Self-pay | Admitting: Family Medicine

## 2021-03-16 NOTE — Telephone Encounter (Signed)
Copied from [place] [id number]. Topic: General - Other >> Mar 16, 2021 11:25 AM Tessa Lerner A wrote: Reason for CRM: The patient has called for an update on the paperwork for their electric wheelchair   The patient has been in contact with [employer] and Raquel Sarna with [employer] has shared with the patient that no information has been received   Raquel Sarna can be reached via fax at [place]   Please contact further if needed

## 2021-03-16 NOTE — Telephone Encounter (Signed)
Call from [employer] checking on status of power chair I let her no that still wating on /dr signature. Cb persn is cynthia. ?

## 2021-03-16 NOTE — Telephone Encounter (Signed)
Requested Prescriptions  ?Pending Prescriptions Disp Refills  ?? amLODipine (NORVASC) 10 MG tablet [Pharmacy Med Name: AMLODIPINE BESYLATE 10MG  TABLETS] 90 tablet 0  ?  Sig: TAKE 1 TABLET(10 MG) BY MOUTH DAILY  ?  ? Cardiovascular: Calcium Channel Blockers 2 Failed - 03/16/2021 12:57 PM  ?  ?  Failed - Last BP in normal range  ?  BP Readings from Last 1 Encounters:  ?03/11/21 (!) 162/105  ?   ?  ?  Passed - Last Heart Rate in normal range  ?  Pulse Readings from Last 1 Encounters:  ?03/11/21 73  ?   ?  ?  Passed - Valid encounter within last 6 months  ?  Recent Outpatient Visits   ?      ? 3 weeks ago Edema of both legs  ? [place] Gildardo Pounds, NP  ? 1 month ago Hospital discharge follow-up  ? [place] Lantana, [place] W, NP  ? 2 months ago Pain in both lower extremities  ? [place], [place], MD  ? 3 months ago Adnexal cyst  ? [place], Charlane Ferretti, MD  ? 3 months ago Neuropathy  ? [place]ers, [place] S, [place]  ?  ?  ?Future Appointments   ?        ? In 1 week [name], MD [place]  ? In 2 weeks [name], MD [place], [facility]  ? In 3 weeks Talbert Nan, Francesca Jewett, [place]  ? In 1 month [name], MD [place]  ?  ? ?  ?  ?  ? ?

## 2021-03-16 NOTE — Telephone Encounter (Signed)
Call placed to [facility] to inquire if she has received the requested information from Dr Margarita Rana. Message left with call back requested to this CM.  ?

## 2021-03-17 ENCOUNTER — Telehealth: Payer: Self-pay

## 2021-03-17 NOTE — Telephone Encounter (Signed)
Copied from [place] [id number]. Topic: General - Other ?>> Mar 16, 2021 11:25 AM Tessa Lerner A wrote: ?Reason for CRM: The patient has called for an update on the paperwork for their electric wheelchair  ? ?The patient has been in contact with [employer] and Raquel Sarna with [employer] has shared with the patient that no information has been received  ? ?Raquel Sarna can be reached via fax at [place]  ? ?Please contact further if needed ?>> Mar 17, 2021  9:12 AM Leward Quan A wrote: ?Raquel Sarna with [employer] called back to speak to Opal Sidles B can be reached at Ph# [phone] ?

## 2021-03-17 NOTE — Telephone Encounter (Signed)
Paperwork has been signed and faxed over to [employer]. ?

## 2021-03-17 NOTE — Telephone Encounter (Signed)
Documents for power chair order were signed by Dr Margarita Rana and faxed to [employer] 03/16/2021.  ?

## 2021-03-17 NOTE — Telephone Encounter (Signed)
Duplicate message. 

## 2021-03-18 ENCOUNTER — Emergency Department (HOSPITAL_COMMUNITY): Payer: Medicare Other

## 2021-03-18 ENCOUNTER — Emergency Department (HOSPITAL_COMMUNITY)
Admission: EM | Admit: 2021-03-18 | Discharge: 2021-03-18 | Disposition: A | Discharge status: Home / Self Care | Payer: Medicare Other | Attending: Emergency Medicine | Admitting: Emergency Medicine

## 2021-03-18 ENCOUNTER — Other Ambulatory Visit: Payer: Self-pay

## 2021-03-18 ENCOUNTER — Encounter (HOSPITAL_COMMUNITY): Payer: Self-pay | Admitting: Emergency Medicine

## 2021-03-18 ENCOUNTER — Emergency Department (HOSPITAL_COMMUNITY)
Admission: EM | Admit: 2021-03-18 | Discharge: 2021-03-19 | Disposition: A | Discharge status: Home / Self Care | Payer: Medicare Other | Source: Home / Self Care | Attending: Student | Admitting: Student

## 2021-03-18 DIAGNOSIS — M79604 Pain in right leg: Secondary | ICD-10-CM | POA: Insufficient documentation

## 2021-03-18 DIAGNOSIS — E1122 Type 2 diabetes mellitus with diabetic chronic kidney disease: Secondary | ICD-10-CM | POA: Insufficient documentation

## 2021-03-18 DIAGNOSIS — R0602 Shortness of breath: Secondary | ICD-10-CM | POA: Diagnosis not present

## 2021-03-18 DIAGNOSIS — Z743 Need for continuous supervision: Secondary | ICD-10-CM | POA: Diagnosis not present

## 2021-03-18 DIAGNOSIS — I5032 Chronic diastolic (congestive) heart failure: Secondary | ICD-10-CM | POA: Insufficient documentation

## 2021-03-18 DIAGNOSIS — I10 Essential (primary) hypertension: Secondary | ICD-10-CM | POA: Diagnosis not present

## 2021-03-18 DIAGNOSIS — I130 Hypertensive heart and chronic kidney disease with heart failure and stage 1 through stage 4 chronic kidney disease, or unspecified chronic kidney disease: Secondary | ICD-10-CM | POA: Insufficient documentation

## 2021-03-18 DIAGNOSIS — J45909 Unspecified asthma, uncomplicated: Secondary | ICD-10-CM | POA: Insufficient documentation

## 2021-03-18 DIAGNOSIS — R600 Localized edema: Secondary | ICD-10-CM | POA: Insufficient documentation

## 2021-03-18 DIAGNOSIS — N183 Chronic kidney disease, stage 3 unspecified: Secondary | ICD-10-CM | POA: Insufficient documentation

## 2021-03-18 DIAGNOSIS — R609 Edema, unspecified: Secondary | ICD-10-CM

## 2021-03-18 DIAGNOSIS — J449 Chronic obstructive pulmonary disease, unspecified: Secondary | ICD-10-CM | POA: Insufficient documentation

## 2021-03-18 DIAGNOSIS — E1142 Type 2 diabetes mellitus with diabetic polyneuropathy: Secondary | ICD-10-CM | POA: Insufficient documentation

## 2021-03-18 DIAGNOSIS — G629 Polyneuropathy, unspecified: Secondary | ICD-10-CM | POA: Diagnosis not present

## 2021-03-18 DIAGNOSIS — Z87891 Personal history of nicotine dependence: Secondary | ICD-10-CM | POA: Insufficient documentation

## 2021-03-18 DIAGNOSIS — Z79891 Long term (current) use of opiate analgesic: Secondary | ICD-10-CM | POA: Diagnosis not present

## 2021-03-18 DIAGNOSIS — Z79899 Other long term (current) drug therapy: Secondary | ICD-10-CM | POA: Insufficient documentation

## 2021-03-18 LAB — CBC WITH DIFFERENTIAL/PLATELET
Abs Immature Granulocytes: 0.03 10*3/uL (ref 0.00–0.07)
Basophils Absolute: 0 10*3/uL (ref 0.0–0.1)
Basophils Relative: 0 %
Eosinophils Absolute: 0.2 10*3/uL (ref 0.0–0.5)
Eosinophils Relative: 2 %
HCT: 36.6 % (ref 36.0–46.0)
Hemoglobin: 11.1 g/dL — ABNORMAL LOW (ref 12.0–15.0)
Immature Granulocytes: 0 %
Lymphocytes Relative: 11 %
Lymphs Abs: 1.1 10*3/uL (ref 0.7–4.0)
MCH: 27.8 pg (ref 26.0–34.0)
MCHC: 30.3 g/dL (ref 30.0–36.0)
MCV: 91.7 fL (ref 80.0–100.0)
Monocytes Absolute: 0.8 10*3/uL (ref 0.1–1.0)
Monocytes Relative: 8 %
Neutro Abs: 7.5 10*3/uL (ref 1.7–7.7)
Neutrophils Relative %: 79 %
Platelets: 361 10*3/uL (ref 150–400)
RBC: 3.99 MIL/uL (ref 3.87–5.11)
RDW: 15.7 % — ABNORMAL HIGH (ref 11.5–15.5)
WBC: 9.6 10*3/uL (ref 4.0–10.5)
nRBC: 0 % (ref 0.0–0.2)

## 2021-03-18 LAB — BASIC METABOLIC PANEL
Anion gap: 12 (ref 5–15)
BUN: 23 mg/dL (ref 8–23)
CO2: 21 mmol/L — ABNORMAL LOW (ref 22–32)
Calcium: 9.4 mg/dL (ref 8.9–10.3)
Chloride: 107 mmol/L (ref 98–111)
Creatinine, Ser: 1.78 mg/dL — ABNORMAL HIGH (ref 0.44–1.00)
GFR, Estimated: 32 mL/min — ABNORMAL LOW (ref >60)
Glucose, Bld: 91 mg/dL (ref 70–99)
Potassium: 3.3 mmol/L — ABNORMAL LOW (ref 3.5–5.1)
Sodium: 140 mmol/L (ref 135–145)

## 2021-03-18 LAB — BRAIN NATRIURETIC PEPTIDE: B Natriuretic Peptide: 27.7 pg/mL (ref 0.0–100.0)

## 2021-03-18 MED ORDER — ACETAMINOPHEN 325 MG PO TABS
650.0000 mg | ORAL_TABLET | Freq: Four times a day (QID) | ORAL | Status: DC | PRN
  Administered 2021-03-18: 650 mg via ORAL
  Filled 2021-03-18: qty 2

## 2021-03-18 NOTE — ED Provider Notes (Signed)
[place] EMERGENCY DEPARTMENT  Provider Note  CSN: [id number] Arrival date & time: 03/18/21 0524  History Chief Complaint  Patient presents with   Leg Pain    Tiffany [name] is a 61 y.o. female with history of chronic leg pain/neuropathy and peripheral edema has frequent ED visits for same. Reports her doctor decreased her gabapentin recently and her legs have been more painful since. She reports having trouble walking due to the pain. She has a walker and has also applied for a motorized wheelchair. She does not have any acute complaints today. She has been seen in the ED for same four times in the last month.    Home Medications Prior to Admission medications   Medication Sig Start Date End Date Taking? Authorizing Provider  albuterol (PROVENTIL) (2.5 MG/3ML) 0.083% nebulizer solution Take 3 mLs (2.5 mg total) by nebulization every 6 (six) hours as needed for wheezing or shortness of breath. 12/16/20   [name], MD  albuterol (VENTOLIN HFA) 108 (90 Base) MCG/ACT inhaler Inhale 2 puffs into the lungs every 6 (six) hours as needed for wheezing or shortness of breath. 02/09/21   [name], MD  amLODipine (NORVASC) 10 MG tablet TAKE 1 TABLET(10 MG) BY MOUTH DAILY 03/16/21   [name], MD  asenapine (SAPHRIS) 5 MG SUBL 24 hr tablet Place 2 tablets (10 mg total) under the tongue 2 (two) times daily. 01/14/21   Mercy Riding, MD  atorvastatin (LIPITOR) 20 MG tablet Take 1 tablet (20 mg total) by mouth at bedtime. 01/14/21   Mercy Riding, MD  colchicine 0.6 MG tablet Take 2 tablets by mouth at the onset of gout flare, may repeat 1 tablet in 1 hour if symptoms persist 01/14/21   Mercy Riding, MD  cyanocobalamin 1000 MCG tablet Take 1 tablet (1,000 mcg total) by mouth daily. 01/14/21   Mercy Riding, MD  cyclobenzaprine (FLEXERIL) 10 MG tablet Take 1 tablet (10 mg total) by mouth 2 (two) times daily as needed. 02/09/21   [name], MD  diclofenac  (VOLTAREN) 75 MG EC tablet TAKE 1 TABLET (75 MG) BY MOUTH TWICE DAILY AS NEEDED 02/09/21   [name], MD  diclofenac Sodium (VOLTAREN) 1 % GEL Apply 4 g topically 4 (four) times daily. 03/01/21   [name], MD  dicyclomine (BENTYL) 10 MG capsule Take 1 capsule (10 mg total) by mouth 3 (three) times daily before meals. 12/24/20   [name], MD  escitalopram (LEXAPRO) 10 MG tablet Take 1 tablet (10 mg total) by mouth daily. 02/09/21 03/11/21  [name], MD  febuxostat (ULORIC) 40 MG tablet Take 1 tablet (40 mg total) by mouth daily. 02/18/21   [name], MD  Fluticasone-Umeclidin-Vilant (TRELEGY ELLIPTA) 100-62.5-25 MCG/ACT AEPB Inhale 1 puff into the lungs daily. 01/14/21   Mercy Riding, MD  furosemide (LASIX) 40 MG tablet Take 1 tablet (40 mg total) by mouth daily. 01/17/21 03/18/21  Mercy Riding, MD  gabapentin (NEURONTIN) 600 MG tablet Take 1 tablet (600 mg total) by mouth 2 (two) times daily. 02/09/21   [name], MD  Iron, Ferrous Sulfate, 325 (65 Fe) MG TABS Take 325 mg by mouth daily. 01/14/21   Mercy Riding, MD  ketoconazole (NIZORAL) 2 % cream Apply 1 application topically daily. Apply to both feet 09/27/20   [provider]  lidocaine (LIDODERM) 5 % Place 1 patch onto the skin daily. Remove & Discard patch within 12 hours or as directed  by MD 12/26/20   Henderly, Britni A, PA-C  montelukast (SINGULAIR) 10 MG tablet Take 1 tablet (10 mg total) by mouth at bedtime. 01/14/21   Mercy Riding, MD  Multiple Vitamin (MULTIVITAMIN WITH MINERALS) TABS tablet Take 1 tablet by mouth daily. 01/14/21   Mercy Riding, MD  omeprazole (PRILOSEC) 40 MG capsule TAKE 1 CAPSULE(40 MG) BY MOUTH DAILY Patient taking differently: Take 40 mg by mouth daily. 10/23/20   [name], MD  oxybutynin (DITROPAN) 5 MG tablet TAKE 1 TABLET(5 MG) BY MOUTH TWICE DAILY Patient taking differently: Take 5 mg by mouth 2 (two) times daily. 10/23/20   [name], MD  potassium chloride SA  (KLOR-CON M) 20 MEQ tablet Take 1 tablet (20 mEq total) by mouth daily. 01/17/21 02/16/21  Mercy Riding, MD  prazosin (MINIPRESS) 2 MG capsule Take 1 capsule (2 mg total) by mouth at bedtime. 02/14/21   [name], MD  sodium chloride (OCEAN) 0.65 % SOLN nasal spray Place 2 sprays into both nostrils daily.    [provider]  terbinafine (LAMISIL AT) 1 % cream Apply 1 application topically 2 (two) times daily. Patient taking differently: Apply 1 application topically 3 (three) times daily. 09/07/20   [name], MD  thiamine 100 MG tablet Take 1 tablet (100 mg total) by mouth daily. 02/27/20   Hongalgi, Lenis Dickinson, MD  torsemide (DEMADEX) 20 MG tablet Take 1 tablet (20 mg total) by mouth daily. 02/25/21   Margarita Mail, PA-C     Allergies    Hydrocodone, Hydroxyzine, Latuda [lurasidone hcl], Magnesium-containing compounds, Prednisone, Tramadol, Codeine, Trazodone, Topamax [topiramate], Sulfa antibiotics, and Tape   Review of Systems   Review of Systems Please see HPI for pertinent positives and negatives  Physical Exam BP (!) 159/91    Pulse 80    Temp 97.9 F (36.6 C)    Resp 19    Ht 5\' 2"  (1.575 m)    Wt 72.6 kg    SpO2 97%    BMI 29.26 kg/m   Physical Exam Vitals and nursing note reviewed.  Constitutional:      Appearance: Normal appearance.  HENT:     Head: Normocephalic and atraumatic.     Nose: Nose normal.     Mouth/Throat:     Mouth: Mucous membranes are moist.  Eyes:     Extraocular Movements: Extraocular movements intact.     Conjunctiva/sclera: Conjunctivae normal.  Cardiovascular:     Rate and Rhythm: Normal rate.  Pulmonary:     Effort: Pulmonary effort is normal.     Breath sounds: Normal breath sounds.  Abdominal:     General: Abdomen is flat.     Palpations: Abdomen is soft.     Tenderness: There is no abdominal tenderness.  Musculoskeletal:        General: No swelling. Normal range of motion.     Cervical back: Neck supple.     Right lower  leg: Edema present.     Left lower leg: Edema present.  Skin:    General: Skin is warm and dry.  Neurological:     General: No focal deficit present.     Mental Status: [name] alert.  Psychiatric:        Mood and Affect: Mood normal.    ED Results / Procedures / Treatments   EKG EKG Interpretation  Date/Time:  Friday March 18 2021 10:41:23 EST Ventricular Rate:  77 PR Interval:  159 QRS Duration: 83 QT Interval:  400 QTC Calculation: 453 R Axis:   -6 Text Interpretation: Sinus rhythm Normal ECG No significant change since last tracing Confirmed by Calvert Cantor [phone]) on 03/18/2021 10:43:07 AM  Procedures Procedures  Medications Ordered in the ED Medications  acetaminophen (TYLENOL) tablet 650 mg (650 mg Oral Given 03/18/21 1013)    Initial Impression and Plan  Patient with chronic leg pain and swelling. She does not have any acute complaints. Had initially refused labs in triage but they were drawn when she got to the exam room. EKG is unchanged. CXR is clear. Patient is demanding IV lasix, I advised that we would need to see her lab work to determine if that is necessary but in general that is not indicated in the ED for peripheral edema. She was encouraged to discuss her medication regimen with her PCP as an outpatient.   ED Course   Clinical Course as of 03/18/21 1221  Fri Mar 18, 2021  1102 CBC is at baseline.  [CS]  1103 I personally viewed the images from radiology studies and agree with radiologist interpretation: CXR is clear  [CS]  4917 BMP is at baseline [CS]  9150 BNP is normal. Patient encouraged to discuss her chronic complaints and medication concerns with her PCP.  [CS]    Clinical Course User Index [CS] Truddie Hidden, MD     MDM Rules/Calculators/A&P Medical Decision Making Problems Addressed: Peripheral edema: chronic illness or injury Peripheral polyneuropathy: chronic illness or injury  Amount and/or Complexity of Data Reviewed Labs:  ordered. Decision-making details documented in ED Course. Radiology: ordered and independent interpretation performed. Decision-making details documented in ED Course.    Final Clinical Impression(s) / ED Diagnoses Final diagnoses:  Peripheral polyneuropathy  Peripheral edema    Rx / DC Orders ED Discharge Orders     None        Truddie Hidden, MD 03/18/21 1221

## 2021-03-18 NOTE — ED Provider Triage Note (Signed)
Emergency Medicine Provider Triage Evaluation Note ? ?Tiffany [name] , a 61 y.o. female  was evaluated in triage.  Pt arrives with EMS complaining of pain and swelling in both of her legs that has been ongoing for weeks.  Pain goes all the way up and down her legs.  She reports some mild shortness of breath but denies any chest pain.  No fevers.  Patient mentions feeling like someone has poisoned her with medications to EMS but does not report this to me.  She has been seen numerous times in the ED for similar symptoms.. ? ?Review of Systems  ?Positive: Leg pain, leg swelling, shortness of breath ?Negative: Chest pain, fevers ? ?Physical Exam  ?BP 129/88   Pulse 72   Temp 98.2 ?F (36.8 ?C) (Oral)   SpO2 95%  ?Gen:   Awake, no distress   ?Resp:  Normal effort  ?MSK:   Moves extremities without difficulty , bilateral lower extremities are swollen, and patient reports tenderness on palpation, no erythema, distal pulses intact  ?other:   ? ?Medical Decision Making  ?Medically screening exam initiated at 5:48 AM.  Appropriate orders placed.  Tiffany [name] was informed that the remainder of the evaluation will be completed by another provider, this initial triage assessment does not replace that evaluation, and the importance of remaining in the ED until their evaluation is complete. ? ? ?  ?Jacqlyn Larsen, PA-C ?03/18/21 1115 ? ?

## 2021-03-18 NOTE — ED Triage Notes (Signed)
Pt brought to ED with c/o BLE pain and swelling for unknown amount of time. Pt noted to ramble in speech, states that her neck is painful and someone is poisoning her medicine causing her legs to hurt. Pt states this has been ongoing. Several parts of story does not seem logical.  ?

## 2021-03-19 DIAGNOSIS — M79604 Pain in right leg: Secondary | ICD-10-CM | POA: Diagnosis not present

## 2021-03-19 LAB — I-STAT CHEM 8, ED
BUN: 20 mg/dL (ref 8–23)
Calcium, Ion: 1.25 mmol/L (ref 1.15–1.40)
Chloride: 106 mmol/L (ref 98–111)
Creatinine, Ser: 1.6 mg/dL — ABNORMAL HIGH (ref 0.44–1.00)
Glucose, Bld: 100 mg/dL — ABNORMAL HIGH (ref 70–99)
HCT: 40 % (ref 36.0–46.0)
Hemoglobin: 13.6 g/dL (ref 12.0–15.0)
Potassium: 3.7 mmol/L (ref 3.5–5.1)
Sodium: 143 mmol/L (ref 135–145)
TCO2: 29 mmol/L (ref 22–32)

## 2021-03-19 MED ORDER — POTASSIUM CHLORIDE CRYS ER 20 MEQ PO TBCR
40.0000 meq | EXTENDED_RELEASE_TABLET | Freq: Once | ORAL | Status: AC
Start: 2021-03-19 — End: 2021-03-19
  Administered 2021-03-19: 40 meq via ORAL
  Filled 2021-03-19: qty 2

## 2021-03-19 MED ORDER — FUROSEMIDE 10 MG/ML IJ SOLN
60.0000 mg | Freq: Once | INTRAMUSCULAR | Status: AC
  Administered 2021-03-19: 60 mg via INTRAVENOUS
  Filled 2021-03-19: qty 6

## 2021-03-19 NOTE — ED Provider Triage Note (Signed)
Emergency Medicine Provider Triage Evaluation Note ? ?Tiffany [name] , a 61 y.o. female  was evaluated in triage.  Pt complains of chronic bilateral lower extremity leg pain.  Patient has been seen numerous times in the ED for similar concerns.  Has associated leg swelling.  Denies fever, chills.  Patient is in triage demanding Lasix at this time.  Patient notes that she is taking her gabapentin however it is not working for her. ? ?Per patient chart review: Patient was evaluated in the ED on 03/18/2021 for similar concerns with a negative work-up. ? ?Review of Systems  ?Positive: As per HPI above ?Negative: As per HPI above ? ?Physical Exam  ?BP (!) 143/93   Pulse 86   Temp 98.5 ?F (36.9 ?C) (Oral)   Resp 16   SpO2 98%  ?Gen:   Awake, no distress   ?Resp:  Normal effort  ?MSK:   Moves extremities without difficulty, bilateral lower extremities with swelling noted and tenderness to palpation.  No erythema noted.  Sensation intact to bilateral lower extremities. ?Other:   ? ?Medical Decision Making  ?Medically screening exam initiated at 12:18 AM.  Appropriate orders placed.  Tiffany [name] was informed that the remainder of the evaluation will be completed by another provider, this initial triage assessment does not replace that evaluation, and the importance of remaining in the ED until their evaluation is complete. ? ?  ?Tiffany [name] A, PA-C ?03/19/21 0033 ? ?

## 2021-03-19 NOTE — ED Triage Notes (Signed)
Patient arrived with EMS from home reports chronic bilateral lower legs pain , seen here yesterday for the same complaint discharged home . Refused RN to take her vital signs at triage . Verbally abusive /argumentative with RN.  ?

## 2021-03-19 NOTE — ED Provider Notes (Signed)
[facility] Provider Note  CSN: [id number] Arrival date & time: 03/18/21 2359  Chief Complaint(s) Leg Pain   HPI Tiffany [name] is a 61 y.o. female with PMH PEA arrest, chronic diastolic heart failure, lower extremity edema that is chronic with frequent ER presentations for lower extremity edema who presents emergency department for leg pain and lower extremity edema.  Patient was seen yesterday in the emergency department for same.  She states that she is upset that she has been taken off her metolazone and her torsemide as this seemed to work for her in the past.  She states that she feels like her Lasix is not working.  [name] requesting a dose of IV Lasix here in the emergency department.  She denies chest pain, shortness of breath, orthopnea, nausea, vomiting or other systemic symptoms.  HPI  Past Medical History Past Medical History:  Diagnosis Date   Agitation 11/22/2017   Anoxic brain injury ([place]) 09/08/2016   C. Arrest due to respiratory failure and COPD exacerbation   Anxiety    Arthritis    "all over" (04/10/2016)   Asthma 10/18/2010   Binge eating disorder    Cardiac arrest ([place]) 09/08/2016   PEA   Carotid artery stenosis    1-39% bilateral by dopplers 11/2016   Chronic diastolic (congestive) heart failure (HCC)    Chronic kidney disease, stage 3 (HCC)    Chronic pain syndrome 06/18/2012   Chronic post-traumatic stress disorder (PTSD) 05/27/2018   Chronic respiratory failure with hypoxia and hypercapnia ([place]) 06/22/2015   TRILOGY Vent >AVAPA-ES., Vt target 200-400, Max P 30 , PS max 20 , PS min 6-10 , E Max 6, E Min 4, Rate Auto AVAPS Rate 2 (titrate for pt comfort) , bleed O2 at 5l/m continuous flow .    Closed displaced fracture of fifth metacarpal bone 03/21/2018   Cocaine use disorder, severe, in sustained remission ([place]) 40/98/1191   Complication of anesthesia    decreased bp, decreased heart rate   COPD (chronic obstructive  pulmonary disease) ([place]) 07/08/2014   Depression    Diabetic neuropathy ([place]) 04/24/2011   Difficulty with speech 01/24/2018   Disorder of nervous system    Drug abuse ([place]) 11/21/2017   Dyslipidemia 04/24/2011   Elevated troponin 04/28/2012   Emphysema    Encephalopathy 11/21/2017   Essential hypertension 03/22/2016   Fibula fracture 07/10/2016   Frequent falls 10/11/2017   GERD (gastroesophageal reflux disease)    Gout 04/11/2017   Heart attack ([place]) 1980s   History of blood transfusion 1994   "couldn't stop bleeding from my period"   History of drug abuse in remission ([place]) 11/28/2015   Quit in 2017   Hyperlipidemia LDL goal <70    Incontinence    Manic depression ([place])    Morbid obesity ([place]) 10/18/2010   Obstructive sleep apnea 10/18/2010   On home oxygen therapy    "6L; 24/7" (04/10/2016)   OSA on CPAP    "wear mask sometimes" (04/10/2016)   Paranoid ([place])    "sometimes; I'm on RX for it" (04/10/2016)   Prolonged Q-T interval on ECG    Rectal bleeding 12/31/2015   Schizoaffective disorder, bipolar type ([place]) 04/05/2018   Seasonal allergies    Seborrheic keratoses 12/31/2013   Seizures ([place])    "don't know what kind; last one was ~ 1 yr ago" (04/10/2016)   Sinus bradycardia    Stroke [facility]) 1980s   denies residual on 04/10/2016   Thrush  09/19/2013   Type 2 diabetes mellitus ([place]) 10/18/2010   Patient Active Problem List   Diagnosis Date Noted   Atypical chest pain 01/13/2021   GERD (gastroesophageal reflux disease)    Neuropathy 11/18/2020   Pain in both feet 11/18/2020   Localized edema 11/18/2020   Encounter for medication management 11/17/2020   Protein-calorie malnutrition, severe 05/15/2020   Malnutrition of moderate degree 62/83/6629   Periumbilical abdominal pain    Dysphagia    Nausea vomiting and diarrhea    Generalized abdominal pain    E. coli UTI 47/65/4650   Acute metabolic encephalopathy 35/46/5681   Psychosis ([place]) 02/20/2020   Schizophrenia,  schizo-affective ([place]) 27/51/7001   Metabolic acidosis, normal anion gap (NAG) 02/20/2020   Altered mental status    Hypocalcemia    AKI (acute kidney injury) ([place]) 02/18/2020   Pelvic mass in female 01/02/2020   Genital herpes 11/25/2019   Stage 2 skin ulcer of sacral region ([place]) 10/28/2019   ARF (acute renal failure) ([place]) 10/27/2019   Family discord 02/04/2019   Aggressive behavior    PTSD (post-traumatic stress disorder) 05/27/2018   Schizoaffective disorder, bipolar type ([place]) 04/05/2018   Closed displaced fracture of fifth metacarpal bone 03/21/2018   Difficulty with speech 01/24/2018   Encephalopathy 11/21/2017   Drug abuse ([place]) 11/21/2017   Frequent falls 10/11/2017   Binge eating disorder    Dependence on continuous supplemental oxygen 05/14/2017   Gout 04/11/2017   Hypomagnesemia    CKD (chronic kidney disease) stage 3, GFR 30-59 ml/min (HCC) 12/15/2016   Carotid artery stenosis    Hypokalemia    Osteoarthritis 10/26/2016   Anoxic brain injury ([place]) 09/08/2016   Overactive bladder 06/07/2016   QT prolongation    OSA and COPD overlap syndrome ([place])    Arthritis    Essential hypertension 03/22/2016   Cocaine use disorder, severe, in sustained remission ([place]) 12/17/2015   History of drug abuse in remission ([place]) 11/28/2015   Hyponatremia 11/25/2015   Chronic diastolic congestive heart failure (HCC)    Chronic respiratory failure with hypoxia ([place]) 06/22/2015   Tobacco use disorder 07/22/2014   COPD (chronic obstructive pulmonary disease) ([place]) 07/08/2014   Seizure ([place]) 01/04/2013   Chronic pain syndrome 06/18/2012   Dyslipidemia 04/24/2011   Anemia 04/24/2011   Diabetic neuropathy ([place]) 04/24/2011   Morbid obesity ([place]) 10/18/2010   Type 2 diabetes mellitus ([place]) 10/18/2010   Home Medication(s) Prior to Admission medications   Medication Sig Start Date End Date Taking? Authorizing Provider  albuterol (PROVENTIL) (2.5 MG/3ML) 0.083% nebulizer solution Take 3  mLs (2.5 mg total) by nebulization every 6 (six) hours as needed for wheezing or shortness of breath. 12/16/20   [name], MD  albuterol (VENTOLIN HFA) 108 (90 Base) MCG/ACT inhaler Inhale 2 puffs into the lungs every 6 (six) hours as needed for wheezing or shortness of breath. 02/09/21   [name], MD  amLODipine (NORVASC) 10 MG tablet TAKE 1 TABLET(10 MG) BY MOUTH DAILY 03/16/21   [name], MD  asenapine (SAPHRIS) 5 MG SUBL 24 hr tablet Place 2 tablets (10 mg total) under the tongue 2 (two) times daily. 01/14/21   Mercy Riding, MD  atorvastatin (LIPITOR) 20 MG tablet Take 1 tablet (20 mg total) by mouth at bedtime. 01/14/21   Mercy Riding, MD  colchicine 0.6 MG tablet Take 2 tablets by mouth at the onset of gout flare, may repeat 1 tablet in 1 hour if symptoms persist 01/14/21   Wendee Beavers  T, MD  cyanocobalamin 1000 MCG tablet Take 1 tablet (1,000 mcg total) by mouth daily. 01/14/21   Mercy Riding, MD  cyclobenzaprine (FLEXERIL) 10 MG tablet Take 1 tablet (10 mg total) by mouth 2 (two) times daily as needed. 02/09/21   [name], MD  diclofenac (VOLTAREN) 75 MG EC tablet TAKE 1 TABLET (75 MG) BY MOUTH TWICE DAILY AS NEEDED 02/09/21   [name], MD  diclofenac Sodium (VOLTAREN) 1 % GEL Apply 4 g topically 4 (four) times daily. 03/01/21   [name], MD  dicyclomine (BENTYL) 10 MG capsule Take 1 capsule (10 mg total) by mouth 3 (three) times daily before meals. 12/24/20   [name], MD  escitalopram (LEXAPRO) 10 MG tablet Take 1 tablet (10 mg total) by mouth daily. 02/09/21 03/11/21  [name], MD  febuxostat (ULORIC) 40 MG tablet Take 1 tablet (40 mg total) by mouth daily. 02/18/21   [name], MD  Fluticasone-Umeclidin-Vilant (TRELEGY ELLIPTA) 100-62.5-25 MCG/ACT AEPB Inhale 1 puff into the lungs daily. 01/14/21   Mercy Riding, MD  furosemide (LASIX) 40 MG tablet Take 1 tablet (40 mg total) by mouth daily. 01/17/21 03/18/21  Mercy Riding, MD  gabapentin  (NEURONTIN) 600 MG tablet Take 1 tablet (600 mg total) by mouth 2 (two) times daily. 02/09/21   [name], MD  Iron, Ferrous Sulfate, 325 (65 Fe) MG TABS Take 325 mg by mouth daily. 01/14/21   Mercy Riding, MD  ketoconazole (NIZORAL) 2 % cream Apply 1 application topically daily. Apply to both feet 09/27/20   [provider]  lidocaine (LIDODERM) 5 % Place 1 patch onto the skin daily. Remove & Discard patch within 12 hours or as directed by MD 12/26/20   Henderly, Britni A, PA-C  montelukast (SINGULAIR) 10 MG tablet Take 1 tablet (10 mg total) by mouth at bedtime. 01/14/21   Mercy Riding, MD  Multiple Vitamin (MULTIVITAMIN WITH MINERALS) TABS tablet Take 1 tablet by mouth daily. 01/14/21   Mercy Riding, MD  omeprazole (PRILOSEC) 40 MG capsule TAKE 1 CAPSULE(40 MG) BY MOUTH DAILY Patient taking differently: Take 40 mg by mouth daily. 10/23/20   [name], MD  oxybutynin (DITROPAN) 5 MG tablet TAKE 1 TABLET(5 MG) BY MOUTH TWICE DAILY Patient taking differently: Take 5 mg by mouth 2 (two) times daily. 10/23/20   [name], MD  potassium chloride SA (KLOR-CON M) 20 MEQ tablet Take 1 tablet (20 mEq total) by mouth daily. 01/17/21 02/16/21  Mercy Riding, MD  prazosin (MINIPRESS) 2 MG capsule Take 1 capsule (2 mg total) by mouth at bedtime. 02/14/21   [name], MD  sodium chloride (OCEAN) 0.65 % SOLN nasal spray Place 2 sprays into both nostrils daily.    [provider]  terbinafine (LAMISIL AT) 1 % cream Apply 1 application topically 2 (two) times daily. Patient taking differently: Apply 1 application topically 3 (three) times daily. 09/07/20   [name], MD  thiamine 100 MG tablet Take 1 tablet (100 mg total) by mouth daily. 02/27/20   Hongalgi, Lenis Dickinson, MD  torsemide (DEMADEX) 20 MG tablet Take 1 tablet (20 mg total) by mouth daily. 02/25/21   Margarita Mail, PA-C  Past Surgical History Past Surgical History:  Procedure Laterality Date   CESAREAN SECTION  1997   HERNIA REPAIR     IR CHOLANGIOGRAM EXISTING TUBE  07/20/2016   IR PERC CHOLECYSTOSTOMY  05/10/2016   IR RADIOLOGIST EVAL & MGMT  06/08/2016   IR RADIOLOGIST EVAL & MGMT  06/29/2016   IR SINUS/FIST TUBE CHK-NON GI  07/12/2016   RIGHT/LEFT HEART CATH AND CORONARY ANGIOGRAPHY N/A 06/19/2017   Procedure: RIGHT/LEFT HEART CATH AND CORONARY ANGIOGRAPHY;  Surgeon: Jolaine Artist, MD;  Location: [facility];  Service: Cardiovascular;  Laterality: N/A;   TIBIA IM NAIL INSERTION Right 07/12/2016   Procedure: INTRAMEDULLARY (IM) NAIL RIGHT TIBIA;  Surgeon: Leandrew Koyanagi, MD;  Location: [facility];  Service: Orthopedics;  Laterality: Right;   UMBILICAL HERNIA REPAIR  ~ 1963   "that's why I don't have a belly button"   VAGINAL HYSTERECTOMY     Family History Family History  Problem Relation Age of Onset   Cancer Father        prostate   Cancer Mother        lung   Depression Mother    Depression Sister    Anxiety disorder Sister    Schizophrenia Sister    Bipolar disorder Sister    Depression Sister    Depression Brother    Heart failure Other        cousin    Social History Social History   Tobacco Use   Smoking status: Former    Packs/day: 1.50    Years: 38.00    Pack years: 57.00    Types: Cigarettes    Start date: 03/13/1977    Quit date: 04/10/2016    Years since quitting: 4.9   Smokeless tobacco: Never  Vaping Use   Vaping Use: Never used  Substance Use Topics   Alcohol use: No    Alcohol/week: 0.0 standard drinks   Drug use: Not Currently    Types: Cocaine    Comment: 04/10/2016 "last used cocaine back in November 2017"   Allergies Hydrocodone, Hydroxyzine, Latuda [lurasidone hcl], Magnesium-containing compounds, Prednisone, Tramadol, Codeine, Trazodone, Topamax [topiramate], Sulfa antibiotics, and Tape  Review of Systems Review of  Systems  Cardiovascular:  Positive for leg swelling.   Physical Exam Vital Signs  I have reviewed the triage vital signs BP (!) 153/101 (BP Location: Left Arm)    Pulse 85    Temp 98.5 F (36.9 C) (Oral)    Resp (!) 21    Ht '5\' 2"'$  (1.575 m)    Wt 72.6 kg    SpO2 100%    BMI 29.26 kg/m   Physical Exam Vitals and nursing note reviewed.  Constitutional:      General: [name] not in acute distress.    Appearance: [name] well-developed.  HENT:     Head: Normocephalic and atraumatic.  Eyes:     Conjunctiva/sclera: Conjunctivae normal.  Cardiovascular:     Rate and Rhythm: Normal rate and regular rhythm.     Heart sounds: No murmur heard. Pulmonary:     Effort: Pulmonary effort is normal. No respiratory distress.     Breath sounds: Normal breath sounds.  Abdominal:     Palpations: Abdomen is soft.     Tenderness: There is no abdominal tenderness.  Musculoskeletal:        General: No swelling.     Cervical back: Neck supple.     Right lower leg: Edema present.  Left lower leg: Edema present.  Skin:    General: Skin is warm and dry.     Capillary Refill: Capillary refill takes less than 2 seconds.  Neurological:     Mental Status: [name] alert.  Psychiatric:        Mood and Affect: Mood normal.    ED Results and Treatments Labs (all labs ordered are listed, but only abnormal results are displayed) Labs Reviewed  I-STAT CHEM 8, ED - Abnormal; Notable for the following components:      Result Value   Creatinine, Ser 1.60 (*)    Glucose, Bld 100 (*)    All other components within normal limits                                                                                                                          Radiology No results found.  Pertinent labs & imaging results that were available during my care of the patient were reviewed by me and considered in my medical decision making (see MDM for details).  Medications Ordered in ED Medications  potassium chloride  SA (KLOR-CON M) CR tablet 40 mEq (has no administration in time range)  furosemide (LASIX) injection 60 mg (60 mg Intravenous Given 03/19/21 0622)                                                                                                                                     Procedures Procedures  (including critical care time)  Medical Decision Making / ED Course   This patient presents to the ED for concern of lower extremity edema, this involves an extensive number of treatment options, and is a complaint that carries with it a high risk of complications and morbidity.  The differential diagnosis includes chronic lower extremity edema, failure of outpatient diuretic therapy, DVT  MDM: Patient seen emergency department for evaluation of lower extremity edema.  Physical exam reveals 1+ pitting edema bilaterally but is otherwise unremarkable.  No rales on lung exam.  I-STAT chemistry with a creatinine of 1.6 but is otherwise unremarkable.  Per patient request she was given 60 mg IV Lasix and bilateral Ace wraps were placed on her lower extremities.  On reevaluation states that she feels much better and she will contact her nephrologist to have a discussion about metolazone and torsemide.  Patient then discharged.    Lab Tests: -I  ordered, reviewed, and interpreted labs.   The pertinent results include:   Labs Reviewed  I-STAT CHEM 8, ED - Abnormal; Notable for the following components:      Result Value   Creatinine, Ser 1.60 (*)    Glucose, Bld 100 (*)    All other components within normal limits      Medicines ordered and prescription drug management: Meds ordered this encounter  Medications   furosemide (LASIX) injection 60 mg   potassium chloride SA (KLOR-CON M) CR tablet 40 mEq    -I have reviewed the patients home medicines and have made adjustments as needed  Critical interventions None   Social Determinants of Health:  Factors impacting patients care include:  none   Reevaluation: After the interventions noted above, I reevaluated the patient and found that they have :improved  Co morbidities that complicate the patient evaluation  Past Medical History:  Diagnosis Date   Agitation 11/22/2017   Anoxic brain injury ([place]) 09/08/2016   C. Arrest due to respiratory failure and COPD exacerbation   Anxiety    Arthritis    "all over" (04/10/2016)   Asthma 10/18/2010   Binge eating disorder    Cardiac arrest ([place]) 09/08/2016   PEA   Carotid artery stenosis    1-39% bilateral by dopplers 11/2016   Chronic diastolic (congestive) heart failure (HCC)    Chronic kidney disease, stage 3 (HCC)    Chronic pain syndrome 06/18/2012   Chronic post-traumatic stress disorder (PTSD) 05/27/2018   Chronic respiratory failure with hypoxia and hypercapnia ([place]) 06/22/2015   TRILOGY Vent >AVAPA-ES., Vt target 200-400, Max P 30 , PS max 20 , PS min 6-10 , E Max 6, E Min 4, Rate Auto AVAPS Rate 2 (titrate for pt comfort) , bleed O2 at 5l/m continuous flow .    Closed displaced fracture of fifth metacarpal bone 03/21/2018   Cocaine use disorder, severe, in sustained remission ([place]) 78/93/8101   Complication of anesthesia    decreased bp, decreased heart rate   COPD (chronic obstructive pulmonary disease) ([place]) 07/08/2014   Depression    Diabetic neuropathy ([place]) 04/24/2011   Difficulty with speech 01/24/2018   Disorder of nervous system    Drug abuse ([place]) 11/21/2017   Dyslipidemia 04/24/2011   Elevated troponin 04/28/2012   Emphysema    Encephalopathy 11/21/2017   Essential hypertension 03/22/2016   Fibula fracture 07/10/2016   Frequent falls 10/11/2017   GERD (gastroesophageal reflux disease)    Gout 04/11/2017   Heart attack ([place]) 1980s   History of blood transfusion 1994   "couldn't stop bleeding from my period"   History of drug abuse in remission ([place]) 11/28/2015   Quit in 2017   Hyperlipidemia LDL goal <70    Incontinence    Manic depression  ([place])    Morbid obesity ([place]) 10/18/2010   Obstructive sleep apnea 10/18/2010   On home oxygen therapy    "6L; 24/7" (04/10/2016)   OSA on CPAP    "wear mask sometimes" (04/10/2016)   Paranoid ([place])    "sometimes; I'm on RX for it" (04/10/2016)   Prolonged Q-T interval on ECG    Rectal bleeding 12/31/2015   Schizoaffective disorder, bipolar type ([place]) 04/05/2018   Seasonal allergies    Seborrheic keratoses 12/31/2013   Seizures ([place])    "don't know what kind; last one was ~ 1 yr ago" (04/10/2016)   Sinus bradycardia    Stroke [facility]) 1980s   denies residual on 04/10/2016   Thrush  09/19/2013   Type 2 diabetes mellitus ([place]) 10/18/2010      Dispostion: I considered admission for this patient, but her lower extremity edema is not associated with shortness of breath or pulmonary edema at this time and thus she is safe for discharge with outpatient follow-up     Final Clinical Impression(s) / ED Diagnoses Final diagnoses:  Peripheral edema     '@PCDICTATION'$ @    Teressa Lower, MD 03/19/21 [id number]

## 2021-03-22 DIAGNOSIS — E119 Type 2 diabetes mellitus without complications: Secondary | ICD-10-CM | POA: Diagnosis not present

## 2021-03-22 DIAGNOSIS — D508 Other iron deficiency anemias: Secondary | ICD-10-CM | POA: Diagnosis not present

## 2021-03-22 DIAGNOSIS — N189 Chronic kidney disease, unspecified: Secondary | ICD-10-CM | POA: Diagnosis not present

## 2021-03-22 DIAGNOSIS — R269 Unspecified abnormalities of gait and mobility: Secondary | ICD-10-CM | POA: Diagnosis not present

## 2021-03-22 DIAGNOSIS — E782 Mixed hyperlipidemia: Secondary | ICD-10-CM | POA: Diagnosis not present

## 2021-03-22 DIAGNOSIS — M79606 Pain in leg, unspecified: Secondary | ICD-10-CM | POA: Diagnosis not present

## 2021-03-22 DIAGNOSIS — I10 Essential (primary) hypertension: Secondary | ICD-10-CM | POA: Diagnosis not present

## 2021-03-22 DIAGNOSIS — Z87891 Personal history of nicotine dependence: Secondary | ICD-10-CM | POA: Diagnosis not present

## 2021-03-22 DIAGNOSIS — I509 Heart failure, unspecified: Secondary | ICD-10-CM | POA: Diagnosis not present

## 2021-03-22 DIAGNOSIS — J42 Unspecified chronic bronchitis: Secondary | ICD-10-CM | POA: Diagnosis not present

## 2021-03-23 ENCOUNTER — Encounter: Payer: Self-pay | Admitting: Family Medicine

## 2021-03-23 ENCOUNTER — Ambulatory Visit: Payer: Self-pay

## 2021-03-23 ENCOUNTER — Encounter (HOSPITAL_COMMUNITY): Payer: Self-pay | Admitting: Emergency Medicine

## 2021-03-23 ENCOUNTER — Ambulatory Visit: Payer: Medicare Other | Attending: Family Medicine | Admitting: Family Medicine

## 2021-03-23 ENCOUNTER — Ambulatory Visit (HOSPITAL_COMMUNITY)
Admission: EM | Admit: 2021-03-23 | Discharge: 2021-03-23 | Disposition: A | Discharge status: Home / Self Care | Payer: Medicare Other | Attending: Family Medicine | Admitting: Family Medicine

## 2021-03-23 ENCOUNTER — Encounter (HOSPITAL_COMMUNITY): Payer: Self-pay

## 2021-03-23 ENCOUNTER — Emergency Department (HOSPITAL_COMMUNITY)
Admission: EM | Admit: 2021-03-23 | Discharge: 2021-03-23 | Disposition: A | Discharge status: Home / Self Care | Payer: Medicare Other | Attending: Emergency Medicine | Admitting: Emergency Medicine

## 2021-03-23 ENCOUNTER — Emergency Department (HOSPITAL_COMMUNITY)
Admission: EM | Admit: 2021-03-23 | Discharge: 2021-03-24 | Disposition: A | Discharge status: Home / Self Care | Payer: Medicare Other | Source: Home / Self Care | Attending: Emergency Medicine | Admitting: Emergency Medicine

## 2021-03-23 ENCOUNTER — Other Ambulatory Visit: Payer: Self-pay

## 2021-03-23 ENCOUNTER — Telehealth: Payer: Self-pay

## 2021-03-23 DIAGNOSIS — R600 Localized edema: Secondary | ICD-10-CM

## 2021-03-23 DIAGNOSIS — Z5321 Procedure and treatment not carried out due to patient leaving prior to being seen by health care provider: Secondary | ICD-10-CM | POA: Diagnosis not present

## 2021-03-23 DIAGNOSIS — M79669 Pain in unspecified lower leg: Secondary | ICD-10-CM | POA: Diagnosis not present

## 2021-03-23 DIAGNOSIS — Z743 Need for continuous supervision: Secondary | ICD-10-CM | POA: Diagnosis not present

## 2021-03-23 DIAGNOSIS — M79661 Pain in right lower leg: Secondary | ICD-10-CM | POA: Insufficient documentation

## 2021-03-23 DIAGNOSIS — R531 Weakness: Secondary | ICD-10-CM | POA: Diagnosis not present

## 2021-03-23 DIAGNOSIS — G8929 Other chronic pain: Secondary | ICD-10-CM | POA: Insufficient documentation

## 2021-03-23 DIAGNOSIS — I509 Heart failure, unspecified: Secondary | ICD-10-CM | POA: Insufficient documentation

## 2021-03-23 DIAGNOSIS — K580 Irritable bowel syndrome with diarrhea: Secondary | ICD-10-CM | POA: Insufficient documentation

## 2021-03-23 DIAGNOSIS — W19XXXA Unspecified fall, initial encounter: Secondary | ICD-10-CM | POA: Insufficient documentation

## 2021-03-23 DIAGNOSIS — R197 Diarrhea, unspecified: Secondary | ICD-10-CM

## 2021-03-23 DIAGNOSIS — R29898 Other symptoms and signs involving the musculoskeletal system: Secondary | ICD-10-CM

## 2021-03-23 DIAGNOSIS — M79604 Pain in right leg: Secondary | ICD-10-CM | POA: Diagnosis not present

## 2021-03-23 DIAGNOSIS — M79662 Pain in left lower leg: Secondary | ICD-10-CM | POA: Insufficient documentation

## 2021-03-23 DIAGNOSIS — M79605 Pain in left leg: Secondary | ICD-10-CM

## 2021-03-23 DIAGNOSIS — R109 Unspecified abdominal pain: Secondary | ICD-10-CM | POA: Diagnosis not present

## 2021-03-23 DIAGNOSIS — R55 Syncope and collapse: Secondary | ICD-10-CM | POA: Diagnosis not present

## 2021-03-23 LAB — CBC
HCT: 33.4 % — ABNORMAL LOW (ref 36.0–46.0)
Hemoglobin: 10.3 g/dL — ABNORMAL LOW (ref 12.0–15.0)
MCH: 27.6 pg (ref 26.0–34.0)
MCHC: 30.8 g/dL (ref 30.0–36.0)
MCV: 89.5 fL (ref 80.0–100.0)
Platelets: 365 10*3/uL (ref 150–400)
RBC: 3.73 MIL/uL — ABNORMAL LOW (ref 3.87–5.11)
RDW: 15.3 % (ref 11.5–15.5)
WBC: 6.2 10*3/uL (ref 4.0–10.5)
nRBC: 0 % (ref 0.0–0.2)

## 2021-03-23 LAB — COMPREHENSIVE METABOLIC PANEL
ALT: 25 U/L (ref 0–44)
AST: 35 U/L (ref 15–41)
Albumin: 3.5 g/dL (ref 3.5–5.0)
Alkaline Phosphatase: 216 U/L — ABNORMAL HIGH (ref 38–126)
Anion gap: 10 (ref 5–15)
BUN: 23 mg/dL (ref 8–23)
CO2: 22 mmol/L (ref 22–32)
Calcium: 9.1 mg/dL (ref 8.9–10.3)
Chloride: 105 mmol/L (ref 98–111)
Creatinine, Ser: 1.85 mg/dL — ABNORMAL HIGH (ref 0.44–1.00)
GFR, Estimated: 31 mL/min — ABNORMAL LOW (ref >60)
Glucose, Bld: 87 mg/dL (ref 70–99)
Potassium: 3.3 mmol/L — ABNORMAL LOW (ref 3.5–5.1)
Sodium: 137 mmol/L (ref 135–145)
Total Bilirubin: 0.2 mg/dL — ABNORMAL LOW (ref 0.3–1.2)
Total Protein: 6.8 g/dL (ref 6.5–8.1)

## 2021-03-23 LAB — LIPASE, BLOOD: Lipase: 38 U/L (ref 11–51)

## 2021-03-23 MED ORDER — ACETAMINOPHEN 325 MG PO TABS
ORAL_TABLET | ORAL | Status: AC
  Filled 2021-03-23: qty 2

## 2021-03-23 MED ORDER — ACETAMINOPHEN 325 MG PO TABS
650.0000 mg | ORAL_TABLET | Freq: Once | ORAL | Status: AC
  Administered 2021-03-23: 650 mg via ORAL

## 2021-03-23 NOTE — ED Triage Notes (Signed)
Pt reports that her legs hurt and she cant walk. Pt here early and LWBS ?

## 2021-03-23 NOTE — ED Provider Triage Note (Signed)
Emergency Medicine Provider Triage Evaluation Note ? ?Tiffany [name] , a 61 y.o. female  was evaluated in triage.  Pt complains of diarrhea, weakness, syncope. Was seen via telemedicine earlier today, fell during telemedicine visit. On EMS arrival patient endorsed diarrhea. She has had weakness for months. ? ?Review of Systems  ?Positive: Diarrhea, weakness ?Negative: Chest pain, Shob ? ?Physical Exam  ?BP 123/73 (BP Location: Right Arm)   Pulse 86   Temp 98.8 ?F (37.1 ?C) (Oral)   Resp 16   SpO2 95%  ?Gen:   Awake, no distress   ?Resp:  Normal effort  ?MSK:   Decreased strength bil LE ?Other:  Odd affect, patient difficult to understand in triage ? ?Medical Decision Making  ?Medically screening exam initiated at 4:38 PM.  Appropriate orders placed.  Tiffany [name] was informed that the remainder of the evaluation will be completed by another provider, this initial triage assessment does not replace that evaluation, and the importance of remaining in the ED until their evaluation is complete. ? ?Workup initiated ?  ?Anselmo Pickler, PA-C ?03/23/21 1639 ? ?

## 2021-03-23 NOTE — ED Triage Notes (Signed)
Pt here from home and was doing a tele health appointment and fell , ems called to her house and pt is now c/o diarrhea, cbg 120  ?

## 2021-03-23 NOTE — ED Notes (Signed)
Patient access called patient's transportation ?

## 2021-03-23 NOTE — ED Provider Notes (Addendum)
[facility] EMERGENCY DEPARTMENT Provider Note   CSN: [id number] Arrival date & time: 03/23/21  2005     History  Chief Complaint  Patient presents with   Leg Pain    Tiffany [name] is a 61 y.o. female.  HPI  61 year old female with history of PEA arrest, CHF, chronic lower extremity edema, chronic peripheral neuropathy, schizoaffective disorder presenting to the emergency department with a chief complaint of acute on chronic peripheral neuropathy and lower extremity swelling.  The patient has had multiple ED visits for the same over the past month.  She states she has a history of congestive heart failure and has been taking her diuretics.    On chart review from urgent care and a PCP video visit today, she endorsed weakness to the point where she could not get up off the floor.  She was told to go to the emergency room for evaluation.  She states that she has taken torsemide for diuresis.  She was argumentative stating "you never do anything for me."  She was advised to present to the emergency department due to concern for worsening renal function in the setting of diarrhea related to her IBS.  [name] tolerating oral intake currently and appears well-hydrated.  The patient further states that she is requesting placement in a long-term care facility because she can no longer take care of herself.  Home Medications Prior to Admission medications   Medication Sig Start Date End Date Taking? Authorizing Provider  albuterol (PROVENTIL) (2.5 MG/3ML) 0.083% nebulizer solution Take 3 mLs (2.5 mg total) by nebulization every 6 (six) hours as needed for wheezing or shortness of breath. 12/16/20   [name], MD  albuterol (VENTOLIN HFA) 108 (90 Base) MCG/ACT inhaler Inhale 2 puffs into the lungs every 6 (six) hours as needed for wheezing or shortness of breath. 02/09/21   [name], MD  amLODipine (NORVASC) 10 MG tablet TAKE 1 TABLET(10 MG) BY MOUTH DAILY 03/16/21    [name], MD  asenapine (SAPHRIS) 5 MG SUBL 24 hr tablet Place 2 tablets (10 mg total) under the tongue 2 (two) times daily. 01/14/21   Mercy Riding, MD  atorvastatin (LIPITOR) 20 MG tablet Take 1 tablet (20 mg total) by mouth at bedtime. 01/14/21   Mercy Riding, MD  colchicine 0.6 MG tablet Take 2 tablets by mouth at the onset of gout flare, may repeat 1 tablet in 1 hour if symptoms persist 01/14/21   Mercy Riding, MD  cyanocobalamin 1000 MCG tablet Take 1 tablet (1,000 mcg total) by mouth daily. 01/14/21   Mercy Riding, MD  cyclobenzaprine (FLEXERIL) 10 MG tablet Take 1 tablet (10 mg total) by mouth 2 (two) times daily as needed. 02/09/21   [name], MD  diclofenac (VOLTAREN) 75 MG EC tablet TAKE 1 TABLET (75 MG) BY MOUTH TWICE DAILY AS NEEDED 02/09/21   [name], MD  diclofenac Sodium (VOLTAREN) 1 % GEL Apply 4 g topically 4 (four) times daily. 03/01/21   [name], MD  dicyclomine (BENTYL) 10 MG capsule Take 1 capsule (10 mg total) by mouth 3 (three) times daily before meals. 12/24/20   [name], MD  escitalopram (LEXAPRO) 10 MG tablet Take 1 tablet (10 mg total) by mouth daily. 02/09/21 03/11/21  [name], MD  febuxostat (ULORIC) 40 MG tablet Take 1 tablet (40 mg total) by mouth daily. 02/18/21   [name], MD  Fluticasone-Umeclidin-Vilant (TRELEGY ELLIPTA) 100-62.5-25 MCG/ACT AEPB Inhale 1 puff  into the lungs daily. 01/14/21   Mercy Riding, MD  furosemide (LASIX) 40 MG tablet Take 1 tablet (40 mg total) by mouth daily. 01/17/21 03/18/21  Mercy Riding, MD  gabapentin (NEURONTIN) 600 MG tablet Take 1 tablet (600 mg total) by mouth 2 (two) times daily. 02/09/21   [name], MD  Iron, Ferrous Sulfate, 325 (65 Fe) MG TABS Take 325 mg by mouth daily. 01/14/21   Mercy Riding, MD  ketoconazole (NIZORAL) 2 % cream Apply 1 application topically daily. Apply to both feet 09/27/20   [provider]  lidocaine (LIDODERM) 5 % Place 1 patch onto the skin  daily. Remove & Discard patch within 12 hours or as directed by MD 12/26/20   Henderly, Britni A, PA-C  montelukast (SINGULAIR) 10 MG tablet Take 1 tablet (10 mg total) by mouth at bedtime. 01/14/21   Mercy Riding, MD  Multiple Vitamin (MULTIVITAMIN WITH MINERALS) TABS tablet Take 1 tablet by mouth daily. 01/14/21   Mercy Riding, MD  omeprazole (PRILOSEC) 40 MG capsule TAKE 1 CAPSULE(40 MG) BY MOUTH DAILY Patient taking differently: Take 40 mg by mouth daily. 10/23/20   [name], MD  oxybutynin (DITROPAN) 5 MG tablet TAKE 1 TABLET(5 MG) BY MOUTH TWICE DAILY Patient taking differently: Take 5 mg by mouth 2 (two) times daily. 10/23/20   [name], MD  potassium chloride SA (KLOR-CON M) 20 MEQ tablet Take 1 tablet (20 mEq total) by mouth daily. 01/17/21 02/16/21  Mercy Riding, MD  prazosin (MINIPRESS) 2 MG capsule Take 1 capsule (2 mg total) by mouth at bedtime. 02/14/21   [name], MD  sodium chloride (OCEAN) 0.65 % SOLN nasal spray Place 2 sprays into both nostrils daily.    [provider]  terbinafine (LAMISIL AT) 1 % cream Apply 1 application topically 2 (two) times daily. Patient taking differently: Apply 1 application topically 3 (three) times daily. 09/07/20   [name], MD  thiamine 100 MG tablet Take 1 tablet (100 mg total) by mouth daily. 02/27/20   Hongalgi, Lenis Dickinson, MD  torsemide (DEMADEX) 20 MG tablet Take 1 tablet (20 mg total) by mouth daily. 02/25/21   Margarita Mail, PA-C      Allergies    Hydrocodone, Hydroxyzine, Latuda [lurasidone hcl], Magnesium-containing compounds, Prednisone, Tramadol, Codeine, Trazodone, Topamax [topiramate], Sulfa antibiotics, and Tape    Review of Systems   Review of Systems  All other systems reviewed and are negative.  Physical Exam Updated Vital Signs BP (!) 144/88    Pulse 76    Temp 98 F (36.7 C)    Resp 16    SpO2 98%  Physical Exam Vitals and nursing note reviewed.  Constitutional:      General: [name] not in  acute distress.    Appearance: [name] well-developed.  HENT:     Head: Normocephalic and atraumatic.     Mouth/Throat:     Mouth: Mucous membranes are moist.  Eyes:     Conjunctiva/sclera: Conjunctivae normal.  Neck:     Vascular: No JVD.  Cardiovascular:     Rate and Rhythm: Normal rate and regular rhythm.  Pulmonary:     Effort: Pulmonary effort is normal. No respiratory distress.     Breath sounds: Normal breath sounds. No wheezing, rhonchi or rales.  Abdominal:     Palpations: Abdomen is soft.     Tenderness: There is no abdominal tenderness.  Musculoskeletal:        General:  No swelling.     Cervical back: Neck supple.     Comments: Trace pitting edema bilaterally  Skin:    General: Skin is warm and dry.     Capillary Refill: Capillary refill takes less than 2 seconds.  Neurological:     General: No focal deficit present.     Mental Status: [name] alert and oriented to person, place, and time. Mental status is at baseline.     Cranial Nerves: No cranial nerve deficit.     Comments: Moving all 4 extremities spontaneously  Psychiatric:        Attention and Perception: Attention and perception normal.        Mood and Affect: Mood normal.        Speech: Speech normal.        Behavior: Behavior normal. Behavior is cooperative.    ED Results / Procedures / Treatments   Labs (all labs ordered are listed, but only abnormal results are displayed) Labs Reviewed - No data to display  EKG None  Radiology No results found.  Procedures Procedures    Medications Ordered in ED Medications  dicyclomine (BENTYL) capsule 10 mg (has no administration in time range)  gabapentin (NEURONTIN) capsule 600 mg (600 mg Oral Not Given 03/24/21 0059)  pantoprazole (PROTONIX) EC tablet 40 mg (has no administration in time range)  torsemide (DEMADEX) tablet 20 mg (has no administration in time range)  montelukast (SINGULAIR) tablet 10 mg (10 mg Oral Given 03/24/21 0059)  oxybutynin  (DITROPAN) tablet 5 mg (has no administration in time range)  prazosin (MINIPRESS) capsule 2 mg (2 mg Oral Given 03/24/21 0059)    ED Course/ Medical Decision Making/ A&P                           Medical Decision Making Risk Prescription drug management.   61 year old female with history of PEA arrest, CHF, chronic lower extremity edema, chronic peripheral neuropathy, schizoaffective disorder presenting to the emergency department with a chief complaint of acute on chronic peripheral neuropathy and lower extremity swelling.   On arrival, the patient was vitally stable.  She presents with several episodes of watery diarrhea earlier today.  She was told to present to the emergency department for evaluation due to concern for possible worsening renal function and dehydration.  [name] tolerating oral intake and requesting food at this time.  She appears well-hydrated on exam.  She has minimal trace pitting edema bilaterally.  She states that she has chronic neuropathy that has been ongoing "for months" and pain associated with edema in her legs.  Her lungs are clear to auscultation bilaterally, she has no obvious motor deficit, exam limited by effort by spontaneously moving her lower extremities.  She endorses chronic weakness from leg pain.  She a virtual appointment with her PCP this morning where she was advised to present to the emergency department for evaluation of her electrolytes and for evaluation for dehydration.  Screening laboratory work-up performed initially in triage prior to the patient leaving before being seen (the patient subsequently returned) revealed a CMP with mild hypokalemia to 3.3, normal sodium, normal bicarbonate, BUN normal, creatinine appears to be close to baseline at 1.85 with no clear evidence for AKI.  On exam, the patient appears well-hydrated.  She has no leukocytosis, hemoglobin appears to be close to baseline at 10.3.  Lipase is normal.  Patient requesting IV  diuresis and work-up for heart failure exacerbation  due to persistent lower extremity swelling.  I see no clear evidence on physical exam for CHF exacerbation.  The patient's lungs were clear to auscultation bilaterally, there was no JVD and the patient has trace pitting edema bilaterally.  She has no chest pain or shortness of breath.  She endorses chronic lower extremity peripheral neuropathy.  I reviewed the patient's last MRI of the lumbar spine from December 2021 which revealed multifactorial moderate to severe spinal stenosis.   I have low suspicion for cauda equina syndrome at this time or need for urgent MRI imaging of the spine due to the patient's lower extremity weakness which is chronic.   On review of her telemetry medicine visit with her PCP regarding her lower extremity weakness, she was counseled for home health referral versus outpatient therapy.  She was insisting on inpatient rehab which could not be placed from the outpatient setting.  Assisted living was discussed with her PCP.  She currently lives alone and is unable to keep up with self-care.  Due to this, a social work consult was placed and home medications were reordered.   Final Clinical Impression(s) / ED Diagnoses Final diagnoses:  Diarrhea, unspecified type  Irritable bowel syndrome with diarrhea  Chronic pain of both lower extremities    Rx / DC Orders ED Discharge Orders     None         Regan Lemming, MD 03/24/21 0130    Regan Lemming, MD 03/24/21 0131    Regan Lemming, MD 03/24/21 0131

## 2021-03-23 NOTE — Progress Notes (Signed)
Virtual Visit via Telephone Note  I connected with Tiffany [name], on 03/23/2021 at 2:13 PM by telephone due to the COVID-19 pandemic and verified that I am speaking with the correct person using two identifiers.   Consent: I discussed the limitations, risks, security and privacy concerns of performing an evaluation and management service by telephone and the availability of in person appointments. I also discussed with the patient that there may be a patient responsible charge related to this service. The patient expressed understanding and agreed to proceed.   Location of Patient: Home  Location of Provider: Clinic   Persons participating in Telemedicine visit: [name] Dr. Margarita Rana     History of Present Illness: Tiffany [name] is a 61 y.o. year old female with a history of hypertension, COPD, schizoaffective disorder, dyslipidemia, stage III chronic kidney disease    She states since 1am this morning her IBS has been acting up and she has been having Diarrhea.  [name] unable to count the number of times she has moved her bowels.  Trying to drink a lot of water but feels so weak. Complains her legs are weak and she cannot feel the floor.  [name] unable to get up and get dressed due to being so weak.  Endorses taking her dicyclomine which has been ineffective. She goes on to inquire if she can be referred for physical therapy but would like inpatient rehab like she did when she was in [place] due to the fact that she is very weak and would like to get stronger. She does not want Assisted living or Nursing home as she wants to 'maintain her independence and have control of her own money'. She does have PCS services 4-7 PM every day but then after that she is alone Past Medical History:  Diagnosis Date   Agitation 11/22/2017   Anoxic brain injury ([place]) 09/08/2016   C. Arrest due to respiratory failure and COPD exacerbation   Anxiety    Arthritis    "all over"  (04/10/2016)   Asthma 10/18/2010   Binge eating disorder    Cardiac arrest ([place]) 09/08/2016   PEA   Carotid artery stenosis    1-39% bilateral by dopplers 11/2016   Chronic diastolic (congestive) heart failure (HCC)    Chronic kidney disease, stage 3 (HCC)    Chronic pain syndrome 06/18/2012   Chronic post-traumatic stress disorder (PTSD) 05/27/2018   Chronic respiratory failure with hypoxia and hypercapnia ([place]) 06/22/2015   TRILOGY Vent >AVAPA-ES., Vt target 200-400, Max P 30 , PS max 20 , PS min 6-10 , E Max 6, E Min 4, Rate Auto AVAPS Rate 2 (titrate for pt comfort) , bleed O2 at 5l/m continuous flow .    Closed displaced fracture of fifth metacarpal bone 03/21/2018   Cocaine use disorder, severe, in sustained remission ([place]) 69/48/5462   Complication of anesthesia    decreased bp, decreased heart rate   COPD (chronic obstructive pulmonary disease) ([place]) 07/08/2014   Depression    Diabetic neuropathy ([place]) 04/24/2011   Difficulty with speech 01/24/2018   Disorder of nervous system    Drug abuse ([place]) 11/21/2017   Dyslipidemia 04/24/2011   Elevated troponin 04/28/2012   Emphysema    Encephalopathy 11/21/2017   Essential hypertension 03/22/2016   Fibula fracture 07/10/2016   Frequent falls 10/11/2017   GERD (gastroesophageal reflux disease)    Gout 04/11/2017   Heart attack ([place]) 1980s   History of blood transfusion 1994   "  couldn't stop bleeding from my period"   History of drug abuse in remission ([place]) 11/28/2015   Quit in 2017   Hyperlipidemia LDL goal <70    Incontinence    Manic depression ([place])    Morbid obesity ([place]) 10/18/2010   Obstructive sleep apnea 10/18/2010   On home oxygen therapy    "6L; 24/7" (04/10/2016)   OSA on CPAP    "wear mask sometimes" (04/10/2016)   Paranoid ([place])    "sometimes; I'm on RX for it" (04/10/2016)   Prolonged Q-T interval on ECG    Rectal bleeding 12/31/2015   Schizoaffective disorder, bipolar type ([place]) 04/05/2018   Seasonal  allergies    Seborrheic keratoses 12/31/2013   Seizures ([place])    "don't know what kind; last one was ~ 1 yr ago" (04/10/2016)   Sinus bradycardia    Stroke [facility]) 1980s   denies residual on 04/10/2016   Thrush 09/19/2013   Type 2 diabetes mellitus ([place]) 10/18/2010   Allergies  Allergen Reactions   Hydrocodone Shortness Of Breath   Hydroxyzine Anaphylaxis and Shortness Of Breath   Latuda [Lurasidone Hcl] Anaphylaxis   Magnesium-Containing Compounds Anaphylaxis    Tolerated Ensure   Prednisone Anaphylaxis, Swelling and Other (See Comments)    Tongue swelling, lip swelling, throat swelling, per pt    Tramadol Anaphylaxis and Swelling   Codeine Nausea And Vomiting   Trazodone Other (See Comments)    paranoia   Topamax [Topiramate] Other (See Comments)    Increases paranoia   Sulfa Antibiotics Itching   Tape Rash    Current Outpatient Medications on File Prior to Visit  Medication Sig Dispense Refill   albuterol (PROVENTIL) (2.5 MG/3ML) 0.083% nebulizer solution Take 3 mLs (2.5 mg total) by nebulization every 6 (six) hours as needed for wheezing or shortness of breath. 360 mL 5   albuterol (VENTOLIN HFA) 108 (90 Base) MCG/ACT inhaler Inhale 2 puffs into the lungs every 6 (six) hours as needed for wheezing or shortness of breath. 8.5 g 2   amLODipine (NORVASC) 10 MG tablet TAKE 1 TABLET(10 MG) BY MOUTH DAILY 90 tablet 0   asenapine (SAPHRIS) 5 MG SUBL 24 hr tablet Place 2 tablets (10 mg total) under the tongue 2 (two) times daily. 60 tablet 0   atorvastatin (LIPITOR) 20 MG tablet Take 1 tablet (20 mg total) by mouth at bedtime. 30 tablet 1   colchicine 0.6 MG tablet Take 2 tablets by mouth at the onset of gout flare, may repeat 1 tablet in 1 hour if symptoms persist 15 tablet 0   cyanocobalamin 1000 MCG tablet Take 1 tablet (1,000 mcg total) by mouth daily. 90 tablet 1   cyclobenzaprine (FLEXERIL) 10 MG tablet Take 1 tablet (10 mg total) by mouth 2 (two) times daily as needed. 180 tablet  0   diclofenac (VOLTAREN) 75 MG EC tablet TAKE 1 TABLET (75 MG) BY MOUTH TWICE DAILY AS NEEDED 60 tablet 1   diclofenac Sodium (VOLTAREN) 1 % GEL Apply 4 g topically 4 (four) times daily. 100 g 0   dicyclomine (BENTYL) 10 MG capsule Take 1 capsule (10 mg total) by mouth 3 (three) times daily before meals. 90 capsule 6   escitalopram (LEXAPRO) 10 MG tablet Take 1 tablet (10 mg total) by mouth daily. 90 tablet 0   febuxostat (ULORIC) 40 MG tablet Take 1 tablet (40 mg total) by mouth daily. 30 tablet 1   Fluticasone-Umeclidin-Vilant (TRELEGY ELLIPTA) 100-62.5-25 MCG/ACT AEPB Inhale 1 puff into the lungs daily.  60 each 6   furosemide (LASIX) 40 MG tablet Take 1 tablet (40 mg total) by mouth daily. 30 tablet 1   gabapentin (NEURONTIN) 600 MG tablet Take 1 tablet (600 mg total) by mouth 2 (two) times daily. 60 tablet 1   Iron, Ferrous Sulfate, 325 (65 Fe) MG TABS Take 325 mg by mouth daily. 60 tablet 2   ketoconazole (NIZORAL) 2 % cream Apply 1 application topically daily. Apply to both feet     lidocaine (LIDODERM) 5 % Place 1 patch onto the skin daily. Remove & Discard patch within 12 hours or as directed by MD 30 patch 1   montelukast (SINGULAIR) 10 MG tablet Take 1 tablet (10 mg total) by mouth at bedtime. 30 tablet 6   Multiple Vitamin (MULTIVITAMIN WITH MINERALS) TABS tablet Take 1 tablet by mouth daily.     omeprazole (PRILOSEC) 40 MG capsule TAKE 1 CAPSULE(40 MG) BY MOUTH DAILY (Patient taking differently: Take 40 mg by mouth daily.) 30 capsule 5   oxybutynin (DITROPAN) 5 MG tablet TAKE 1 TABLET(5 MG) BY MOUTH TWICE DAILY (Patient taking differently: Take 5 mg by mouth 2 (two) times daily.) 60 tablet 5   potassium chloride SA (KLOR-CON M) 20 MEQ tablet Take 1 tablet (20 mEq total) by mouth daily. 30 tablet 0   prazosin (MINIPRESS) 2 MG capsule Take 1 capsule (2 mg total) by mouth at bedtime. 30 capsule 0   sodium chloride (OCEAN) 0.65 % SOLN nasal spray Place 2 sprays into both nostrils daily.      terbinafine (LAMISIL AT) 1 % cream Apply 1 application topically 2 (two) times daily. (Patient taking differently: Apply 1 application topically 3 (three) times daily.) 30 g 1   thiamine 100 MG tablet Take 1 tablet (100 mg total) by mouth daily.     torsemide (DEMADEX) 20 MG tablet Take 1 tablet (20 mg total) by mouth daily. 6 tablet 0   No current facility-administered medications on file prior to visit.    ROS: See HPI  Observations/Objective: Awake, alert, oriented x3 She sounds lethargic over the phone    CMP Latest Ref Rng & Units 03/19/2021 03/18/2021 03/10/2021  Glucose 70 - 99 mg/dL 100(H) 91 78  BUN 8 - 23 mg/dL '20 23 23  '$ Creatinine 0.44 - 1.00 mg/dL 1.60(H) 1.78(H) 1.58(H)  Sodium 135 - 145 mmol/L 143 140 141  Potassium 3.5 - 5.1 mmol/L 3.7 3.3(L) 3.9  Chloride 98 - 111 mmol/L 106 107 110  CO2 22 - 32 mmol/L - 21(L) 24  Calcium 8.9 - 10.3 mg/dL - 9.4 9.7  Total Protein 6.5 - 8.1 g/dL - - 7.9  Total Bilirubin 0.3 - 1.2 mg/dL - - 0.5  Alkaline Phos 38 - 126 U/L - - 193(H)  AST 15 - 41 U/L - - 51(H)  ALT 0 - 44 U/L - - 35    Lipid Panel     Component Value Date/Time   CHOL 127 08/05/2018 1748   TRIG 53 08/05/2018 1748   HDL 62 08/05/2018 1748   CHOLHDL 2.0 08/05/2018 1748   CHOLHDL 3.7 01/07/2017 0005   VLDL 38 01/07/2017 0005   LDLCALC 54 08/05/2018 1748   LDLDIRECT 52 04/05/2012 1210   LABVLDL 11 08/05/2018 1748    Lab Results  Component Value Date   HGBA1C 5.3 07/23/2020    Assessment and Plan: 1. Diarrhea, unspecified type Due to frequency of diarrhea she is probably dehydrated as evidenced from her voice Oral hydration has failed to  improve her symptoms and Bentyl has been ineffective [name] unable to get up from the floor per patient She will need to have labs and electrolytes to determine if she is dehydrated, evaluate for AKI and the need for IV fluids On advising of the need for ED visit she states the ED is tired of seeing her and she is also  tired of seeing them. We will call 911 to have her transported to the ED  2. Weakness of both lower extremities Counseled that I am happy to refer her for home health versus outpatient therapy which ever she would prefer [name] insisting that she would like inpatient rehab which cannot be placed from the outpatient setting Weakness is pronounced in the setting of GI losses. Due to her chronic medical conditions with frequent exacerbations we have discussed the need for assisted living which will provide her with some form of support as she lives alone and is unable to keep up with self-care but she is not open to this due to the fact that she will lose some form of independence per patient.   Follow Up Instructions: Keep previously scheduled appointment   I discussed the assessment and treatment plan with the patient. The patient was provided an opportunity to ask questions and all were answered. The patient agreed with the plan and demonstrated an understanding of the instructions.   The patient was advised to call back or seek an in-person evaluation if the symptoms worsen or if the condition fails to improve as anticipated.     I provided 13 minutes total of non-face-to-face time during this encounter.   [name], MD, FAAFP. [facility], [place]   03/23/2021, 2:13 PM

## 2021-03-23 NOTE — Discharge Instructions (Addendum)
You are given Tylenol 650 mg here in the clinic ? ?Please call your new primary doctor and make an appointment for the next week or 2 to be evaluated ? ?Please call your cardiologist, and let them know that your swelling has worsened, and that you need to see them hopefully in the next week or 2. ?

## 2021-03-23 NOTE — ED Triage Notes (Signed)
Patient sitting in wheelchair.  Reports legs are weak.  Bilateral ankle edema.  Reports legs hurt and is having difficulty walking.  Reports legs have felt weak for 3 months.  Patient reports has talked to provider about this issue.   ?

## 2021-03-23 NOTE — Telephone Encounter (Signed)
Pt has had diarrhea since 1am. Pt states she has had  too many bowel movements to count. Pt sounds a bit weak. Pt states that when she drinks, it goes right through. Pt has a 30 gallon container filled with used briefs. She states she cannot go to ED because of the diarrhea. ?Pt is also states she cannot stand/ walk because of leg pain.  ?Pt has virtual appt this afternoon. Pt will not go to ED. She wants to speak with Dr. Margarita Rana.  ?

## 2021-03-23 NOTE — Telephone Encounter (Signed)
She has an appointment with me today and I will address this. ?

## 2021-03-23 NOTE — Telephone Encounter (Signed)
?Chief Complaint: Diarrhea since 1am today ?Symptoms: uncontrolled diarrhea  ?Frequency: since 1 am ?Pertinent Negatives: Patient denies  ?Disposition: '[x]'$ ED /'[]'$ Urgent Care (no appt availability in office) / '[]'$ Appointment(In office/virtual)/ '[]'$  [facility]/ '[]'$ Home Care/ '[]'$ Refused Recommended Disposition /'[]'$ [facility]/ '[]'$  Follow-up with PCP ?Additional Notes: Pt has had diarrhea since 1am. Pt states she has had  too many bowel movements to count. Pt sounds a bit weak. Pt states that when she drinks, it goes right through. Pt has a 30 gallon container filled with used briefs. She states she cannot go to ED because of the diarrhea. ?Pt is also states she cannot stand/ walk because of leg pain.  ?Pt has virtual appt this afternoon. Pt will not go to ED. She wants to speak with Dr. Margarita Rana.  ? ? ? ? ?Per Agent  -  Pt is having uncontrolled diarrhea. ? ?Reason for Disposition ? [1] SEVERE diarrhea (e.g., 7 or more times / day more than normal) AND [2] age > 60 years ? Patient sounds very sick or weak to the triager ? ?Answer Assessment - Initial Assessment Questions ?1. DIARRHEA SEVERITY: "How bad is the diarrhea?" "How many more stools have you had in the past 24 hours than normal?"  ?  - NO DIARRHEA (SCALE 0) ?  - MILD (SCALE 1-3): Few loose or mushy BMs; increase of 1-3 stools over normal daily number of stools; mild increase in ostomy output. ?  -  MODERATE (SCALE 4-7): Increase of 4-6 stools daily over normal; moderate increase in ostomy output. ?* SEVERE (SCALE 8-10; OR 'WORST POSSIBLE'): Increase of 7 or more stools daily over normal; moderate increase in ostomy output; incontinence. ?    uncountable ?2. ONSET: "When did the diarrhea begin?"  ?    Last night - 1 am ?3. BM CONSISTENCY: "How loose or watery is the diarrhea?"  ?    watery ?4. VOMITING: "Are you also vomiting?" If Yes, ask: "How many times in the past 24 hours?"  ?    no ?5. ABDOMINAL PAIN: "Are you having any abdominal pain?"  If Yes, ask: "What does it feel like?" (e.g., crampy, dull, intermittent, constant)  ?    no ?6. ABDOMINAL PAIN SEVERITY: If present, ask: "How bad is the pain?"  (e.g., Scale 1-10; mild, moderate, or severe) ?  - MILD (1-3): doesn't interfere with normal activities, abdomen soft and not tender to touch  ?  - MODERATE (4-7): interferes with normal activities or awakens from sleep, abdomen tender to touch  ?  - SEVERE (8-10): excruciating pain, doubled over, unable to do any normal activities   ?    0 in abdomen ?7. ORAL INTAKE: If vomiting, "Have you been able to drink liquids?" "How much liquids have you had in the past 24 hours?" ?    no ?8. HYDRATION: "Any signs of dehydration?" (e.g., dry mouth [not just dry lips], too weak to stand, dizziness, new weight loss) "When did you last urinate?" ?    Yes - going through her. ?9. EXPOSURE: "Have you traveled to a foreign country recently?" "Have you been exposed to anyone with diarrhea?" "Could you have eaten any food that was spoiled?" ?    no ?10. ANTIBIOTIC USE: "Are you taking antibiotics now or have you taken antibiotics in the past 2 months?" ?      no ?11. OTHER SYMPTOMS: "Do you have any other symptoms?" (e.g., fever, blood in stool) ?  no ?12. PREGNANCY: "Is there any chance you are pregnant?" "When was your last menstrual period?" ?      no ? ?Protocols used: Diarrhea-A-AH ? ?

## 2021-03-23 NOTE — ED Provider Notes (Addendum)
[facility]    CSN: [id number] Arrival date & time: 03/23/21  1715      History   Chief Complaint Chief Complaint  Patient presents with   Leg Pain    HPI Tiffany [name] is a 61 y.o. female.    Leg Pain Patient is here with leg pain.  States her legs hurt all the time and that makes it hard for her to get around.  On questioning it sounds like they hurt because of edema.  She also told Dr. Margarita Rana earlier today in a video visit she was so weak she could not get up off the floor.  She was told to go the emergency room for urgent evaluation.  Once here in the urgent care setting, the patient tells [name] that the ER "does not do anything for me".  She states that they only give her 1 dose of medicine and only let her go urinate once.  She does relate that she has found a new primary care doctor and has a hard time explaining why she will call them to get evaluated for these problems also  She also relates that she is seeing cardiology, and that they have prescribed her torsemide and metolazone in the past.  [name] unable to tell me why she will call them concerning her edema.  [name] argumentative in the room, stating that she just knows her legs hurt.  She denies fever or chills.  She denies shortness of breath.  Past Medical History:  Diagnosis Date   Agitation 11/22/2017   Anoxic brain injury ([place]) 09/08/2016   C. Arrest due to respiratory failure and COPD exacerbation   Anxiety    Arthritis    "all over" (04/10/2016)   Asthma 10/18/2010   Binge eating disorder    Cardiac arrest ([place]) 09/08/2016   PEA   Carotid artery stenosis    1-39% bilateral by dopplers 11/2016   Chronic diastolic (congestive) heart failure (HCC)    Chronic kidney disease, stage 3 (HCC)    Chronic pain syndrome 06/18/2012   Chronic post-traumatic stress disorder (PTSD) 05/27/2018   Chronic respiratory failure with hypoxia and hypercapnia ([place]) 06/22/2015   TRILOGY Vent >AVAPA-ES., Vt  target 200-400, Max P 30 , PS max 20 , PS min 6-10 , E Max 6, E Min 4, Rate Auto AVAPS Rate 2 (titrate for pt comfort) , bleed O2 at 5l/m continuous flow .    Closed displaced fracture of fifth metacarpal bone 03/21/2018   Cocaine use disorder, severe, in sustained remission ([place]) 38/46/6599   Complication of anesthesia    decreased bp, decreased heart rate   COPD (chronic obstructive pulmonary disease) ([place]) 07/08/2014   Depression    Diabetic neuropathy ([place]) 04/24/2011   Difficulty with speech 01/24/2018   Disorder of nervous system    Drug abuse ([place]) 11/21/2017   Dyslipidemia 04/24/2011   Elevated troponin 04/28/2012   Emphysema    Encephalopathy 11/21/2017   Essential hypertension 03/22/2016   Fibula fracture 07/10/2016   Frequent falls 10/11/2017   GERD (gastroesophageal reflux disease)    Gout 04/11/2017   Heart attack ([place]) 1980s   History of blood transfusion 1994   "couldn't stop bleeding from my period"   History of drug abuse in remission ([place]) 11/28/2015   Quit in 2017   Hyperlipidemia LDL goal <70    Incontinence    Manic depression ([place])    Morbid obesity ([place]) 10/18/2010   Obstructive sleep apnea 10/18/2010  On home oxygen therapy    "6L; 24/7" (04/10/2016)   OSA on CPAP    "wear mask sometimes" (04/10/2016)   Paranoid ([place])    "sometimes; I'm on RX for it" (04/10/2016)   Prolonged Q-T interval on ECG    Rectal bleeding 12/31/2015   Schizoaffective disorder, bipolar type ([place]) 04/05/2018   Seasonal allergies    Seborrheic keratoses 12/31/2013   Seizures ([place])    "don't know what kind; last one was ~ 1 yr ago" (04/10/2016)   Sinus bradycardia    Stroke ([place]) 1980s   denies residual on 04/10/2016   Thrush 09/19/2013   Type 2 diabetes mellitus ([place]) 10/18/2010    Patient Active Problem List   Diagnosis Date Noted   Atypical chest pain 01/13/2021   GERD (gastroesophageal reflux disease)    Neuropathy 11/18/2020   Pain in both feet 11/18/2020    Localized edema 11/18/2020   Encounter for medication management 11/17/2020   Protein-calorie malnutrition, severe 05/15/2020   Malnutrition of moderate degree 60/73/7106   Periumbilical abdominal pain    Dysphagia    Nausea vomiting and diarrhea    Generalized abdominal pain    E. coli UTI 26/94/8546   Acute metabolic encephalopathy 27/03/5007   Psychosis ([place]) 02/20/2020   Schizophrenia, schizo-affective ([place]) 38/18/2993   Metabolic acidosis, normal anion gap (NAG) 02/20/2020   Altered mental status    Hypocalcemia    AKI (acute kidney injury) ([place]) 02/18/2020   Pelvic mass in female 01/02/2020   Genital herpes 11/25/2019   Stage 2 skin ulcer of sacral region ([place]) 10/28/2019   ARF (acute renal failure) ([place]) 10/27/2019   Family discord 02/04/2019   Aggressive behavior    PTSD (post-traumatic stress disorder) 05/27/2018   Schizoaffective disorder, bipolar type ([place]) 04/05/2018   Closed displaced fracture of fifth metacarpal bone 03/21/2018   Difficulty with speech 01/24/2018   Encephalopathy 11/21/2017   Drug abuse ([place]) 11/21/2017   Frequent falls 10/11/2017   Binge eating disorder    Dependence on continuous supplemental oxygen 05/14/2017   Gout 04/11/2017   Hypomagnesemia    CKD (chronic kidney disease) stage 3, GFR 30-59 ml/min (HCC) 12/15/2016   Carotid artery stenosis    Hypokalemia    Osteoarthritis 10/26/2016   Anoxic brain injury ([place]) 09/08/2016   Overactive bladder 06/07/2016   QT prolongation    OSA and COPD overlap syndrome ([place])    Arthritis    Essential hypertension 03/22/2016   Cocaine use disorder, severe, in sustained remission ([place]) 12/17/2015   History of drug abuse in remission ([place]) 11/28/2015   Hyponatremia 11/25/2015   Chronic diastolic congestive heart failure (HCC)    Chronic respiratory failure with hypoxia ([name]) 06/22/2015   Tobacco use disorder 07/22/2014   COPD (chronic obstructive pulmonary disease) ([place]) 07/08/2014   Seizure ([place])  01/04/2013   Chronic pain syndrome 06/18/2012   Dyslipidemia 04/24/2011   Anemia 04/24/2011   Diabetic neuropathy ([place]) 04/24/2011   Morbid obesity ([place]) 10/18/2010   Type 2 diabetes mellitus ([place]) 10/18/2010    Past Surgical History:  Procedure Laterality Date   CESAREAN SECTION  1997   HERNIA REPAIR     IR CHOLANGIOGRAM EXISTING TUBE  07/20/2016   IR PERC CHOLECYSTOSTOMY  05/10/2016   IR RADIOLOGIST EVAL & MGMT  06/08/2016   IR RADIOLOGIST EVAL & MGMT  06/29/2016   IR SINUS/FIST TUBE CHK-NON GI  07/12/2016   RIGHT/LEFT HEART CATH AND CORONARY ANGIOGRAPHY N/A 06/19/2017   Procedure: RIGHT/LEFT HEART CATH AND CORONARY  ANGIOGRAPHY;  Surgeon: Jolaine Artist, MD;  Location: [facility];  Service: Cardiovascular;  Laterality: N/A;   TIBIA IM NAIL INSERTION Right 07/12/2016   Procedure: INTRAMEDULLARY (IM) NAIL RIGHT TIBIA;  Surgeon: Leandrew Koyanagi, MD;  Location: [facility];  Service: Orthopedics;  Laterality: Right;   UMBILICAL HERNIA REPAIR  ~ 1963   "that's why I don't have a belly button"   VAGINAL HYSTERECTOMY      OB History     Gravida  4   Para  4   Term  3   Preterm  1   AB  0   Living  3      SAB  0   IAB  0   Ectopic  0   Multiple  0   Live Births  3            Home Medications    Prior to Admission medications   Medication Sig Start Date End Date Taking? Authorizing Provider  albuterol (PROVENTIL) (2.5 MG/3ML) 0.083% nebulizer solution Take 3 mLs (2.5 mg total) by nebulization every 6 (six) hours as needed for wheezing or shortness of breath. 12/16/20   [name], MD  albuterol (VENTOLIN HFA) 108 (90 Base) MCG/ACT inhaler Inhale 2 puffs into the lungs every 6 (six) hours as needed for wheezing or shortness of breath. 02/09/21   [name], MD  amLODipine (NORVASC) 10 MG tablet TAKE 1 TABLET(10 MG) BY MOUTH DAILY 03/16/21   [name], MD  asenapine (SAPHRIS) 5 MG SUBL 24 hr tablet Place 2 tablets (10 mg total) under the tongue 2 (two)  times daily. 01/14/21   Mercy Riding, MD  atorvastatin (LIPITOR) 20 MG tablet Take 1 tablet (20 mg total) by mouth at bedtime. 01/14/21   Mercy Riding, MD  colchicine 0.6 MG tablet Take 2 tablets by mouth at the onset of gout flare, may repeat 1 tablet in 1 hour if symptoms persist 01/14/21   Mercy Riding, MD  cyanocobalamin 1000 MCG tablet Take 1 tablet (1,000 mcg total) by mouth daily. 01/14/21   Mercy Riding, MD  cyclobenzaprine (FLEXERIL) 10 MG tablet Take 1 tablet (10 mg total) by mouth 2 (two) times daily as needed. 02/09/21   [name], MD  diclofenac (VOLTAREN) 75 MG EC tablet TAKE 1 TABLET (75 MG) BY MOUTH TWICE DAILY AS NEEDED 02/09/21   [name], MD  diclofenac Sodium (VOLTAREN) 1 % GEL Apply 4 g topically 4 (four) times daily. 03/01/21   [name], MD  dicyclomine (BENTYL) 10 MG capsule Take 1 capsule (10 mg total) by mouth 3 (three) times daily before meals. 12/24/20   [name], MD  escitalopram (LEXAPRO) 10 MG tablet Take 1 tablet (10 mg total) by mouth daily. 02/09/21 03/11/21  [name], MD  febuxostat (ULORIC) 40 MG tablet Take 1 tablet (40 mg total) by mouth daily. 02/18/21   [name], MD  Fluticasone-Umeclidin-Vilant (TRELEGY ELLIPTA) 100-62.5-25 MCG/ACT AEPB Inhale 1 puff into the lungs daily. 01/14/21   Mercy Riding, MD  furosemide (LASIX) 40 MG tablet Take 1 tablet (40 mg total) by mouth daily. 01/17/21 03/18/21  Mercy Riding, MD  gabapentin (NEURONTIN) 600 MG tablet Take 1 tablet (600 mg total) by mouth 2 (two) times daily. 02/09/21   [name], MD  Iron, Ferrous Sulfate, 325 (65 Fe) MG TABS Take 325 mg by mouth daily. 01/14/21   Mercy Riding, MD  ketoconazole (NIZORAL) 2 % cream Apply  1 application topically daily. Apply to both feet 09/27/20   [provider]  lidocaine (LIDODERM) 5 % Place 1 patch onto the skin daily. Remove & Discard patch within 12 hours or as directed by MD 12/26/20   Henderly, Britni A, PA-C  montelukast  (SINGULAIR) 10 MG tablet Take 1 tablet (10 mg total) by mouth at bedtime. 01/14/21   Mercy Riding, MD  Multiple Vitamin (MULTIVITAMIN WITH MINERALS) TABS tablet Take 1 tablet by mouth daily. 01/14/21   Mercy Riding, MD  omeprazole (PRILOSEC) 40 MG capsule TAKE 1 CAPSULE(40 MG) BY MOUTH DAILY Patient taking differently: Take 40 mg by mouth daily. 10/23/20   [name], MD  oxybutynin (DITROPAN) 5 MG tablet TAKE 1 TABLET(5 MG) BY MOUTH TWICE DAILY Patient taking differently: Take 5 mg by mouth 2 (two) times daily. 10/23/20   [name], MD  potassium chloride SA (KLOR-CON M) 20 MEQ tablet Take 1 tablet (20 mEq total) by mouth daily. 01/17/21 02/16/21  Mercy Riding, MD  prazosin (MINIPRESS) 2 MG capsule Take 1 capsule (2 mg total) by mouth at bedtime. 02/14/21   [name], MD  sodium chloride (OCEAN) 0.65 % SOLN nasal spray Place 2 sprays into both nostrils daily.    [provider]  terbinafine (LAMISIL AT) 1 % cream Apply 1 application topically 2 (two) times daily. Patient taking differently: Apply 1 application topically 3 (three) times daily. 09/07/20   [name], MD  thiamine 100 MG tablet Take 1 tablet (100 mg total) by mouth daily. 02/27/20   Hongalgi, Lenis Dickinson, MD  torsemide (DEMADEX) 20 MG tablet Take 1 tablet (20 mg total) by mouth daily. 02/25/21   Margarita Mail, PA-C    Family History Family History  Problem Relation Age of Onset   Cancer Mother        lung   Depression Mother    Cancer Father        prostate   Depression Sister    Anxiety disorder Sister    Schizophrenia Sister    Bipolar disorder Sister    Depression Sister    Depression Brother    Heart failure Other        cousin    Social History Social History   Tobacco Use   Smoking status: Former    Packs/day: 1.50    Years: 38.00    Pack years: 57.00    Types: Cigarettes    Start date: 03/13/1977    Quit date: 04/10/2016    Years since quitting: 4.9   Smokeless tobacco: Never   Vaping Use   Vaping Use: Never used  Substance Use Topics   Alcohol use: No    Alcohol/week: 0.0 standard drinks   Drug use: Not Currently    Types: Cocaine    Comment: 04/10/2016 "last used cocaine back in November 2017"     Allergies   Hydrocodone, Hydroxyzine, Latuda [lurasidone hcl], Magnesium-containing compounds, Prednisone, Tramadol, Codeine, Trazodone, Topamax [topiramate], Sulfa antibiotics, and Tape   Review of Systems Review of Systems   Physical Exam Triage Vital Signs ED Triage Vitals  Enc Vitals Group     BP 03/23/21 1915 140/90     Pulse Rate 03/23/21 1915 83     Resp 03/23/21 1915 20     Temp 03/23/21 1915 98.1 F (36.7 C)     Temp Source 03/23/21 1915 Oral     SpO2 03/23/21 1915 97 %     Weight --  Height --      Head Circumference --      Peak Flow --      Pain Score 03/23/21 1907 7     Pain Loc --      Pain Edu? --      Excl. in [place]? --    No data found.  Updated Vital Signs BP 140/90 (BP Location: Right Arm)    Pulse 83    Temp 98.1 F (36.7 C) (Oral)    Resp 20    SpO2 97%   Visual Acuity Right Eye Distance:   Left Eye Distance:   Bilateral Distance:    Right Eye Near:   Left Eye Near:    Bilateral Near:     Physical Exam Vitals reviewed.  Constitutional:      General: [name] not in acute distress.    Appearance: [name] not ill-appearing, toxic-appearing or diaphoretic.  HENT:     Mouth/Throat:     Mouth: Mucous membranes are moist.  Cardiovascular:     Rate and Rhythm: Normal rate and regular rhythm.     Heart sounds: No murmur heard. Pulmonary:     Effort: Pulmonary effort is normal.     Breath sounds: Normal breath sounds.  Musculoskeletal:     Right lower leg: Edema (2+) present.     Left lower leg: Edema (2+) present.  Skin:    Findings: No erythema or lesion.     Comments: No ulcerations  Neurological:     Mental Status: [name] alert.     UC Treatments / Results  Labs (all labs ordered are listed, but only  abnormal results are displayed) Labs Reviewed - No data to display  EKG   Radiology No results found.  Procedures Procedures (including critical care time)  Medications Ordered in UC Medications  acetaminophen (TYLENOL) tablet 650 mg (has no administration in time range)    Initial Impression / Assessment and Plan / UC Course  I have reviewed the triage vital signs and the nursing notes.  Pertinent labs & imaging results that were available during my care of the patient were reviewed by me and considered in my medical decision making (see chart for details).     With her poor kidney function (last creatinine is 1.85), I cannot give her a Toradol injection here in the office.  I have told her I think the best place for her tonight is in the emergency room for urgent evaluation, but she repeatedly declines to go.  I also have asked her to please call her primary care doctor and her cardiologist tomorrow when their offices are open to get appointments with them for evaluation of what they can do for her problems We will give her a dose of Tylenol tonight to hopefully help her discomfort overnight. Final Clinical Impressions(s) / UC Diagnoses   Final diagnoses:  Pain in both lower extremities  Leg edema  Weakness     Discharge Instructions      You are given Tylenol 650 mg here in the clinic  Please call your new primary doctor and make an appointment for the next week or 2 to be evaluated  Please call your cardiologist, and let them know that your swelling has worsened, and that you need to see them hopefully in the next week or 2.     ED Prescriptions   None    PDMP not reviewed this encounter.   Barrett Henle, MD 03/23/21 [phone]  Barrett Henle, MD 03/23/21 [phone]

## 2021-03-23 NOTE — Telephone Encounter (Signed)
Will address concerns during virtual appt today. LVM. No answer.  ?

## 2021-03-24 DIAGNOSIS — J45909 Unspecified asthma, uncomplicated: Secondary | ICD-10-CM | POA: Diagnosis not present

## 2021-03-24 DIAGNOSIS — R062 Wheezing: Secondary | ICD-10-CM | POA: Diagnosis not present

## 2021-03-24 DIAGNOSIS — R197 Diarrhea, unspecified: Secondary | ICD-10-CM | POA: Diagnosis not present

## 2021-03-24 DIAGNOSIS — G4733 Obstructive sleep apnea (adult) (pediatric): Secondary | ICD-10-CM | POA: Diagnosis not present

## 2021-03-24 MED ORDER — MONTELUKAST SODIUM 10 MG PO TABS
10.0000 mg | ORAL_TABLET | Freq: Every day | ORAL | Status: DC
Start: 2021-03-24 — End: 2021-03-24
  Administered 2021-03-24: 01:00:00 10 mg via ORAL
  Filled 2021-03-24: qty 1

## 2021-03-24 MED ORDER — GABAPENTIN 300 MG PO CAPS
600.0000 mg | ORAL_CAPSULE | Freq: Once | ORAL | Status: DC
  Filled 2021-03-24: qty 2

## 2021-03-24 MED ORDER — TORSEMIDE 20 MG PO TABS: 20.0000 mg | ORAL_TABLET | Freq: Every day | ORAL | Status: DC

## 2021-03-24 MED ORDER — PRAZOSIN HCL 2 MG PO CAPS
2.0000 mg | ORAL_CAPSULE | Freq: Every day | ORAL | Status: DC
Start: 2021-03-24 — End: 2021-03-24
  Administered 2021-03-24: 01:00:00 2 mg via ORAL
  Filled 2021-03-24: qty 1

## 2021-03-24 MED ORDER — OXYBUTYNIN CHLORIDE 5 MG PO TABS
5.0000 mg | ORAL_TABLET | Freq: Two times a day (BID) | ORAL | Status: DC
Start: 2021-03-24 — End: 2021-03-24

## 2021-03-24 MED ORDER — PANTOPRAZOLE SODIUM 40 MG PO TBEC: 40.0000 mg | DELAYED_RELEASE_TABLET | Freq: Every day | ORAL | Status: DC

## 2021-03-24 MED ORDER — DICYCLOMINE HCL 10 MG PO CAPS
10.0000 mg | ORAL_CAPSULE | Freq: Three times a day (TID) | ORAL | Status: DC
  Filled 2021-03-24: qty 1

## 2021-03-24 NOTE — ED Provider Notes (Signed)
Patient with multiple chronic complaints, including leg pain and swelling. Frequently in the ED for same. She does not have an emergent medical condition and does not meet criteria for admission to the hospital. She was advised to discuss her concerns with her primary care doctor. [name] otherwise stable for discharge home.  ?  ?Truddie Hidden, MD ?03/24/21 0034 ? ?

## 2021-03-24 NOTE — ED Notes (Signed)
Discharge instructions reviewed with patient. Patient verbalized understanding of instructions. Follow-up care and medications were reviewed. Patient ambulatory with steady gait. VSS upon discharge.  ?

## 2021-03-25 ENCOUNTER — Ambulatory Visit: Payer: Self-pay | Admitting: *Deleted

## 2021-03-25 ENCOUNTER — Telehealth: Payer: Self-pay

## 2021-03-25 ENCOUNTER — Telehealth: Payer: Self-pay | Admitting: Nurse Practitioner

## 2021-03-25 NOTE — Telephone Encounter (Addendum)
?  Chief Complaint: back pain ?Symptoms: severe ?Frequency: shooting ?Pertinent Negatives: Patient denies na ?Disposition: '[x]'$ ED /'[]'$ Urgent Care (no appt availability in office) / '[]'$ Appointment(In office/virtual)/ '[]'$  [facility]/ '[]'$ Home Care/ '[x]'$ Refused Recommended Disposition /'[]'$ [facility]/ '[]'$  Follow-up with PCP ?Additional Notes: Pt refusing disposition of ED. States crawling to bathroom due to pain. Virtual appt made for 03/29/21 but patient would like to speak with an MD. Bonne Dolores to explain situation. Patient is persistent in refusing ED, she went 03/23/21 to ED. ? ?Reason for Disposition ? [1] SEVERE back pain (e.g., excruciating) AND [2] sudden onset AND [3] age > 75 years ? ?Answer Assessment - Initial Assessment Questions ?1. ONSET: "When did the pain begin?"  ?    years ?2. LOCATION: "Where does it hurt?" (upper, mid or lower back) ?    Lower  ?3. SEVERITY: "How bad is the pain?"  (e.g., Scale 1-10; mild, moderate, or severe) ?  - MILD (1-3): doesn't interfere with normal activities  ?  - MODERATE (4-7): interferes with normal activities or awakens from sleep  ?  - SEVERE (8-10): excruciating pain, unable to do any normal activities  ?    severe ?4. PATTERN: "Is the pain constant?" (e.g., yes, no; constant, intermittent)  ?    Comes and goes ?5. RADIATION: "Does the pain shoot into your legs or elsewhere?" ?    Yes down legs, feels like heat ?6. CAUSE:  "What do you think is causing the back pain?"  ?    unsure ?7. BACK OVERUSE:  "Any recent lifting of heavy objects, strenuous work or exercise?" ?    na ?8. MEDICATIONS: "What have you taken so far for the pain?" (e.g., nothing, acetaminophen, NSAIDS) ?    yes ?9. NEUROLOGIC SYMPTOMS: "Do you have any weakness, numbness, or problems with bowel/bladder control?" ?    Has neuropathy ?10. OTHER SYMPTOMS: "Do you have any other symptoms?" (e.g., fever, abdominal pain, burning with urination, blood in urine) ?      no ?11. PREGNANCY: "Is there  any chance you are pregnant?" (e.g., yes, no; LMP) ?      no ? ?Protocols used: Back Pain-A-AH ? ?

## 2021-03-25 NOTE — Telephone Encounter (Signed)
Copied from [place] [id number]. Topic: General - Other ?>> Mar 25, 2021  4:53 PM Camille Bal, [place] wrote: ?Reason for CRM:pt called im says needs consent form to go into assisted living. Please call back ?

## 2021-03-25 NOTE — Telephone Encounter (Addendum)
Pt called again at 2:04pm to say she needs to hear from the doctor today because her diarrhea and IBS is out of control. [name] requesting medication to control diarrhea.  ?

## 2021-03-26 ENCOUNTER — Emergency Department (HOSPITAL_COMMUNITY)
Admission: EM | Admit: 2021-03-26 | Discharge: 2021-03-27 | Disposition: A | Discharge status: Home / Self Care | Payer: Medicare Other | Source: Home / Self Care | Attending: Emergency Medicine | Admitting: Emergency Medicine

## 2021-03-26 ENCOUNTER — Encounter (HOSPITAL_COMMUNITY): Payer: Self-pay | Admitting: Emergency Medicine

## 2021-03-26 ENCOUNTER — Other Ambulatory Visit: Payer: Self-pay

## 2021-03-26 DIAGNOSIS — K6389 Other specified diseases of intestine: Secondary | ICD-10-CM | POA: Diagnosis not present

## 2021-03-26 DIAGNOSIS — K8020 Calculus of gallbladder without cholecystitis without obstruction: Secondary | ICD-10-CM | POA: Diagnosis not present

## 2021-03-26 DIAGNOSIS — R609 Edema, unspecified: Secondary | ICD-10-CM

## 2021-03-26 DIAGNOSIS — M1990 Unspecified osteoarthritis, unspecified site: Secondary | ICD-10-CM | POA: Diagnosis not present

## 2021-03-26 DIAGNOSIS — I130 Hypertensive heart and chronic kidney disease with heart failure and stage 1 through stage 4 chronic kidney disease, or unspecified chronic kidney disease: Secondary | ICD-10-CM | POA: Diagnosis not present

## 2021-03-26 DIAGNOSIS — J42 Unspecified chronic bronchitis: Secondary | ICD-10-CM | POA: Diagnosis not present

## 2021-03-26 DIAGNOSIS — Z743 Need for continuous supervision: Secondary | ICD-10-CM | POA: Diagnosis not present

## 2021-03-26 DIAGNOSIS — I252 Old myocardial infarction: Secondary | ICD-10-CM | POA: Diagnosis not present

## 2021-03-26 DIAGNOSIS — M79604 Pain in right leg: Secondary | ICD-10-CM | POA: Insufficient documentation

## 2021-03-26 DIAGNOSIS — D631 Anemia in chronic kidney disease: Secondary | ICD-10-CM | POA: Diagnosis not present

## 2021-03-26 DIAGNOSIS — E1142 Type 2 diabetes mellitus with diabetic polyneuropathy: Secondary | ICD-10-CM | POA: Diagnosis not present

## 2021-03-26 DIAGNOSIS — F32A Depression, unspecified: Secondary | ICD-10-CM | POA: Diagnosis not present

## 2021-03-26 DIAGNOSIS — J302 Other seasonal allergic rhinitis: Secondary | ICD-10-CM | POA: Diagnosis not present

## 2021-03-26 DIAGNOSIS — F259 Schizoaffective disorder, unspecified: Secondary | ICD-10-CM | POA: Diagnosis not present

## 2021-03-26 DIAGNOSIS — J9612 Chronic respiratory failure with hypercapnia: Secondary | ICD-10-CM | POA: Diagnosis not present

## 2021-03-26 DIAGNOSIS — M79605 Pain in left leg: Secondary | ICD-10-CM

## 2021-03-26 DIAGNOSIS — I5032 Chronic diastolic (congestive) heart failure: Secondary | ICD-10-CM | POA: Diagnosis not present

## 2021-03-26 DIAGNOSIS — R296 Repeated falls: Secondary | ICD-10-CM | POA: Diagnosis not present

## 2021-03-26 DIAGNOSIS — D649 Anemia, unspecified: Secondary | ICD-10-CM | POA: Diagnosis not present

## 2021-03-26 DIAGNOSIS — E1122 Type 2 diabetes mellitus with diabetic chronic kidney disease: Secondary | ICD-10-CM | POA: Diagnosis not present

## 2021-03-26 DIAGNOSIS — I10 Essential (primary) hypertension: Secondary | ICD-10-CM | POA: Diagnosis not present

## 2021-03-26 DIAGNOSIS — J9611 Chronic respiratory failure with hypoxia: Secondary | ICD-10-CM | POA: Diagnosis not present

## 2021-03-26 DIAGNOSIS — K5731 Diverticulosis of large intestine without perforation or abscess with bleeding: Secondary | ICD-10-CM | POA: Diagnosis not present

## 2021-03-26 DIAGNOSIS — R600 Localized edema: Secondary | ICD-10-CM | POA: Insufficient documentation

## 2021-03-26 DIAGNOSIS — J439 Emphysema, unspecified: Secondary | ICD-10-CM | POA: Diagnosis not present

## 2021-03-26 DIAGNOSIS — E1140 Type 2 diabetes mellitus with diabetic neuropathy, unspecified: Secondary | ICD-10-CM | POA: Diagnosis not present

## 2021-03-26 DIAGNOSIS — G8929 Other chronic pain: Secondary | ICD-10-CM | POA: Insufficient documentation

## 2021-03-26 DIAGNOSIS — I110 Hypertensive heart disease with heart failure: Secondary | ICD-10-CM | POA: Diagnosis not present

## 2021-03-26 DIAGNOSIS — E785 Hyperlipidemia, unspecified: Secondary | ICD-10-CM | POA: Diagnosis not present

## 2021-03-26 DIAGNOSIS — R531 Weakness: Secondary | ICD-10-CM | POA: Diagnosis not present

## 2021-03-26 DIAGNOSIS — K219 Gastro-esophageal reflux disease without esophagitis: Secondary | ICD-10-CM | POA: Diagnosis not present

## 2021-03-26 DIAGNOSIS — G894 Chronic pain syndrome: Secondary | ICD-10-CM | POA: Diagnosis not present

## 2021-03-26 DIAGNOSIS — D62 Acute posthemorrhagic anemia: Secondary | ICD-10-CM | POA: Diagnosis not present

## 2021-03-26 DIAGNOSIS — K625 Hemorrhage of anus and rectum: Secondary | ICD-10-CM | POA: Diagnosis not present

## 2021-03-26 DIAGNOSIS — K922 Gastrointestinal hemorrhage, unspecified: Secondary | ICD-10-CM | POA: Diagnosis not present

## 2021-03-26 DIAGNOSIS — N1832 Chronic kidney disease, stage 3b: Secondary | ICD-10-CM | POA: Diagnosis not present

## 2021-03-26 DIAGNOSIS — G931 Anoxic brain damage, not elsewhere classified: Secondary | ICD-10-CM | POA: Diagnosis not present

## 2021-03-26 DIAGNOSIS — K921 Melena: Secondary | ICD-10-CM | POA: Diagnosis not present

## 2021-03-26 MED ORDER — GABAPENTIN 300 MG PO CAPS
600.0000 mg | ORAL_CAPSULE | Freq: Once | ORAL | Status: AC
  Administered 2021-03-26: 600 mg via ORAL
  Filled 2021-03-26: qty 2

## 2021-03-26 MED ORDER — FUROSEMIDE 40 MG PO TABS
40.0000 mg | ORAL_TABLET | Freq: Once | ORAL | Status: AC
  Administered 2021-03-26: 40 mg via ORAL
  Filled 2021-03-26: qty 1

## 2021-03-26 MED ORDER — POTASSIUM CHLORIDE CRYS ER 20 MEQ PO TBCR
40.0000 meq | EXTENDED_RELEASE_TABLET | Freq: Once | ORAL | Status: AC
  Administered 2021-03-26: 40 meq via ORAL
  Filled 2021-03-26: qty 2

## 2021-03-26 NOTE — ED Provider Notes (Incomplete)
[facility] DEPT Provider Note   CSN: [id number] Arrival date & time: 03/26/21  2046     History {Add pertinent medical, surgical, social history, OB history to HPI:1} Chief Complaint  Patient presents with   Leg Pain    Tiffany [name] is a 61 y.o. female.  HPI     Home Medications Prior to Admission medications   Medication Sig Start Date End Date Taking? Authorizing Provider  albuterol (PROVENTIL) (2.5 MG/3ML) 0.083% nebulizer solution Take 3 mLs (2.5 mg total) by nebulization every 6 (six) hours as needed for wheezing or shortness of breath. 12/16/20   [name], MD  albuterol (VENTOLIN HFA) 108 (90 Base) MCG/ACT inhaler Inhale 2 puffs into the lungs every 6 (six) hours as needed for wheezing or shortness of breath. 02/09/21   [name], MD  amLODipine (NORVASC) 10 MG tablet TAKE 1 TABLET(10 MG) BY MOUTH DAILY 03/16/21   [name], MD  asenapine (SAPHRIS) 5 MG SUBL 24 hr tablet Place 2 tablets (10 mg total) under the tongue 2 (two) times daily. 01/14/21   Mercy Riding, MD  atorvastatin (LIPITOR) 20 MG tablet Take 1 tablet (20 mg total) by mouth at bedtime. 01/14/21   Mercy Riding, MD  colchicine 0.6 MG tablet Take 2 tablets by mouth at the onset of gout flare, may repeat 1 tablet in 1 hour if symptoms persist 01/14/21   Mercy Riding, MD  cyanocobalamin 1000 MCG tablet Take 1 tablet (1,000 mcg total) by mouth daily. 01/14/21   Mercy Riding, MD  cyclobenzaprine (FLEXERIL) 10 MG tablet Take 1 tablet (10 mg total) by mouth 2 (two) times daily as needed. 02/09/21   [name], MD  diclofenac (VOLTAREN) 75 MG EC tablet TAKE 1 TABLET (75 MG) BY MOUTH TWICE DAILY AS NEEDED 02/09/21   [name], MD  diclofenac Sodium (VOLTAREN) 1 % GEL Apply 4 g topically 4 (four) times daily. 03/01/21   [name], MD  dicyclomine (BENTYL) 10 MG capsule Take 1 capsule (10 mg total) by mouth 3 (three) times daily before meals. 12/24/20   [name], MD  escitalopram (LEXAPRO) 10 MG tablet Take 1 tablet (10 mg total) by mouth daily. 02/09/21 03/11/21  [name], MD  febuxostat (ULORIC) 40 MG tablet Take 1 tablet (40 mg total) by mouth daily. 02/18/21   [name], MD  Fluticasone-Umeclidin-Vilant (TRELEGY ELLIPTA) 100-62.5-25 MCG/ACT AEPB Inhale 1 puff into the lungs daily. 01/14/21   Mercy Riding, MD  furosemide (LASIX) 40 MG tablet Take 1 tablet (40 mg total) by mouth daily. 01/17/21 03/18/21  Mercy Riding, MD  gabapentin (NEURONTIN) 600 MG tablet Take 1 tablet (600 mg total) by mouth 2 (two) times daily. 02/09/21   [name], MD  Iron, Ferrous Sulfate, 325 (65 Fe) MG TABS Take 325 mg by mouth daily. 01/14/21   Mercy Riding, MD  ketoconazole (NIZORAL) 2 % cream Apply 1 application topically daily. Apply to both feet 09/27/20   [provider]  lidocaine (LIDODERM) 5 % Place 1 patch onto the skin daily. Remove & Discard patch within 12 hours or as directed by MD 12/26/20   Henderly, Britni A, PA-C  montelukast (SINGULAIR) 10 MG tablet Take 1 tablet (10 mg total) by mouth at bedtime. 01/14/21   Mercy Riding, MD  Multiple Vitamin (MULTIVITAMIN WITH MINERALS) TABS tablet Take 1 tablet by mouth daily. 01/14/21   Mercy Riding, MD  omeprazole (PRILOSEC) 40 MG capsule  TAKE 1 CAPSULE(40 MG) BY MOUTH DAILY Patient taking differently: Take 40 mg by mouth daily. 10/23/20   [name], MD  oxybutynin (DITROPAN) 5 MG tablet TAKE 1 TABLET(5 MG) BY MOUTH TWICE DAILY Patient taking differently: Take 5 mg by mouth 2 (two) times daily. 10/23/20   [name], MD  potassium chloride SA (KLOR-CON M) 20 MEQ tablet Take 1 tablet (20 mEq total) by mouth daily. 01/17/21 02/16/21  Mercy Riding, MD  prazosin (MINIPRESS) 2 MG capsule Take 1 capsule (2 mg total) by mouth at bedtime. 02/14/21   [name], MD  sodium chloride (OCEAN) 0.65 % SOLN nasal spray Place 2 sprays into both nostrils daily.    [provider]   terbinafine (LAMISIL AT) 1 % cream Apply 1 application topically 2 (two) times daily. Patient taking differently: Apply 1 application topically 3 (three) times daily. 09/07/20   [name], MD  thiamine 100 MG tablet Take 1 tablet (100 mg total) by mouth daily. 02/27/20   Hongalgi, Lenis Dickinson, MD  torsemide (DEMADEX) 20 MG tablet Take 1 tablet (20 mg total) by mouth daily. 02/25/21   Margarita Mail, PA-C      Allergies    Hydrocodone, Hydroxyzine, Latuda [lurasidone hcl], Magnesium-containing compounds, Prednisone, Tramadol, Codeine, Trazodone, Topamax [topiramate], Sulfa antibiotics, and Tape    Review of Systems   Review of Systems  Physical Exam Updated Vital Signs BP 127/83 (BP Location: Right Arm)    Pulse 74    Temp 97.8 F (36.6 C) (Oral)    Resp 18    Ht '5\' 2"'$  (1.575 m)    Wt 74.8 kg    SpO2 100%    BMI 30.18 kg/m  Physical Exam     ED Results / Procedures / Treatments   Labs (all labs ordered are listed, but only abnormal results are displayed) Labs Reviewed - No data to display  EKG None  Radiology No results found.  Procedures Procedures  {Document cardiac monitor, telemetry assessment procedure when appropriate:1}  Medications Ordered in ED Medications  furosemide (LASIX) tablet 40 mg (40 mg Oral Given 03/26/21 2219)  potassium chloride SA (KLOR-CON M) CR tablet 40 mEq (40 mEq Oral Given 03/26/21 2219)  gabapentin (NEURONTIN) capsule 600 mg (600 mg Oral Given 03/26/21 2219)    ED Course/ Medical Decision Making/ A&P                           Medical Decision Making Risk Prescription drug management.   ***  {Document critical care time when appropriate:1} {Document review of labs and clinical decision tools ie heart score, Chads2Vasc2 etc:1}  {Document your independent review of radiology images, and any outside records:1} {Document your discussion with family members, caretakers, and with consultants:1} {Document social determinants of health  affecting pt's care:1} {Document your decision making why or why not admission, treatments were needed:1} Final Clinical Impression(s) / ED Diagnoses Final diagnoses:  Chronic pain of both lower extremities  Peripheral edema    Rx / DC Orders ED Discharge Orders     None

## 2021-03-26 NOTE — ED Triage Notes (Addendum)
Patient complaining of leg pain that is chronic in nature. Hx chronic leg swelling, multiple ER visits for same. Patient known to be non-compliant with certain medications. Patient's door had to be broken down because she couldn't find her keys that were tied to her walker. Patient slept during ambulance ride.  ? ?130/90 ?76 ?120 cbg ?

## 2021-03-26 NOTE — Discharge Instructions (Addendum)
1.  You were given a dose of Lasix in the emergency department.  You have some swelling of your legs.  This appears mostly chronic.  Schedule a follow-up appointment with your family doctor soon as possible.  Continue your regular medications. ?

## 2021-03-28 ENCOUNTER — Other Ambulatory Visit: Payer: Self-pay

## 2021-03-28 ENCOUNTER — Encounter (HOSPITAL_COMMUNITY): Payer: Self-pay | Admitting: Emergency Medicine

## 2021-03-28 ENCOUNTER — Inpatient Hospital Stay (HOSPITAL_COMMUNITY)
Admission: EM | Admit: 2021-03-28 | Discharge: 2021-04-06 | DRG: 378 | Disposition: A | Discharge status: Skilled Nursing Facility | Payer: Medicare Other | Attending: Internal Medicine | Admitting: Internal Medicine

## 2021-03-28 DIAGNOSIS — R531 Weakness: Secondary | ICD-10-CM

## 2021-03-28 DIAGNOSIS — K922 Gastrointestinal hemorrhage, unspecified: Principal | ICD-10-CM | POA: Diagnosis present

## 2021-03-28 DIAGNOSIS — Z91048 Other nonmedicinal substance allergy status: Secondary | ICD-10-CM

## 2021-03-28 DIAGNOSIS — R296 Repeated falls: Secondary | ICD-10-CM | POA: Diagnosis present

## 2021-03-28 DIAGNOSIS — N184 Chronic kidney disease, stage 4 (severe): Secondary | ICD-10-CM

## 2021-03-28 DIAGNOSIS — D62 Acute posthemorrhagic anemia: Secondary | ICD-10-CM | POA: Diagnosis present

## 2021-03-28 DIAGNOSIS — D500 Iron deficiency anemia secondary to blood loss (chronic): Secondary | ICD-10-CM | POA: Diagnosis present

## 2021-03-28 DIAGNOSIS — Z90710 Acquired absence of both cervix and uterus: Secondary | ICD-10-CM

## 2021-03-28 DIAGNOSIS — G931 Anoxic brain damage, not elsewhere classified: Secondary | ICD-10-CM | POA: Diagnosis present

## 2021-03-28 DIAGNOSIS — E785 Hyperlipidemia, unspecified: Secondary | ICD-10-CM

## 2021-03-28 DIAGNOSIS — K921 Melena: Secondary | ICD-10-CM

## 2021-03-28 DIAGNOSIS — J9611 Chronic respiratory failure with hypoxia: Secondary | ICD-10-CM | POA: Diagnosis present

## 2021-03-28 DIAGNOSIS — Z79899 Other long term (current) drug therapy: Secondary | ICD-10-CM

## 2021-03-28 DIAGNOSIS — F419 Anxiety disorder, unspecified: Secondary | ICD-10-CM | POA: Diagnosis present

## 2021-03-28 DIAGNOSIS — D649 Anemia, unspecified: Secondary | ICD-10-CM | POA: Diagnosis present

## 2021-03-28 DIAGNOSIS — I5032 Chronic diastolic (congestive) heart failure: Secondary | ICD-10-CM | POA: Diagnosis present

## 2021-03-28 DIAGNOSIS — G894 Chronic pain syndrome: Secondary | ICD-10-CM | POA: Diagnosis present

## 2021-03-28 DIAGNOSIS — J439 Emphysema, unspecified: Secondary | ICD-10-CM | POA: Diagnosis present

## 2021-03-28 DIAGNOSIS — J449 Chronic obstructive pulmonary disease, unspecified: Secondary | ICD-10-CM | POA: Diagnosis present

## 2021-03-28 DIAGNOSIS — D631 Anemia in chronic kidney disease: Secondary | ICD-10-CM | POA: Diagnosis present

## 2021-03-28 DIAGNOSIS — Z6829 Body mass index (BMI) 29.0-29.9, adult: Secondary | ICD-10-CM

## 2021-03-28 DIAGNOSIS — J9612 Chronic respiratory failure with hypercapnia: Secondary | ICD-10-CM | POA: Diagnosis present

## 2021-03-28 DIAGNOSIS — E669 Obesity, unspecified: Secondary | ICD-10-CM | POA: Diagnosis present

## 2021-03-28 DIAGNOSIS — M1990 Unspecified osteoarthritis, unspecified site: Secondary | ICD-10-CM | POA: Diagnosis present

## 2021-03-28 DIAGNOSIS — J302 Other seasonal allergic rhinitis: Secondary | ICD-10-CM | POA: Diagnosis present

## 2021-03-28 DIAGNOSIS — I252 Old myocardial infarction: Secondary | ICD-10-CM

## 2021-03-28 DIAGNOSIS — F32A Depression, unspecified: Secondary | ICD-10-CM | POA: Diagnosis present

## 2021-03-28 DIAGNOSIS — Z87891 Personal history of nicotine dependence: Secondary | ICD-10-CM

## 2021-03-28 DIAGNOSIS — Z885 Allergy status to narcotic agent status: Secondary | ICD-10-CM

## 2021-03-28 DIAGNOSIS — G4733 Obstructive sleep apnea (adult) (pediatric): Secondary | ICD-10-CM | POA: Diagnosis present

## 2021-03-28 DIAGNOSIS — K219 Gastro-esophageal reflux disease without esophagitis: Secondary | ICD-10-CM | POA: Diagnosis present

## 2021-03-28 DIAGNOSIS — M109 Gout, unspecified: Secondary | ICD-10-CM | POA: Diagnosis present

## 2021-03-28 DIAGNOSIS — E1122 Type 2 diabetes mellitus with diabetic chronic kidney disease: Secondary | ICD-10-CM | POA: Diagnosis present

## 2021-03-28 DIAGNOSIS — Z7951 Long term (current) use of inhaled steroids: Secondary | ICD-10-CM

## 2021-03-28 DIAGNOSIS — I10 Essential (primary) hypertension: Secondary | ICD-10-CM | POA: Diagnosis present

## 2021-03-28 DIAGNOSIS — Z882 Allergy status to sulfonamides status: Secondary | ICD-10-CM

## 2021-03-28 DIAGNOSIS — E1140 Type 2 diabetes mellitus with diabetic neuropathy, unspecified: Secondary | ICD-10-CM | POA: Diagnosis present

## 2021-03-28 DIAGNOSIS — K625 Hemorrhage of anus and rectum: Principal | ICD-10-CM

## 2021-03-28 DIAGNOSIS — Z8674 Personal history of sudden cardiac arrest: Secondary | ICD-10-CM

## 2021-03-28 DIAGNOSIS — F259 Schizoaffective disorder, unspecified: Secondary | ICD-10-CM | POA: Diagnosis present

## 2021-03-28 DIAGNOSIS — F1421 Cocaine dependence, in remission: Secondary | ICD-10-CM | POA: Diagnosis present

## 2021-03-28 DIAGNOSIS — Z8673 Personal history of transient ischemic attack (TIA), and cerebral infarction without residual deficits: Secondary | ICD-10-CM

## 2021-03-28 DIAGNOSIS — Z9981 Dependence on supplemental oxygen: Secondary | ICD-10-CM

## 2021-03-28 DIAGNOSIS — F4312 Post-traumatic stress disorder, chronic: Secondary | ICD-10-CM | POA: Diagnosis present

## 2021-03-28 DIAGNOSIS — I130 Hypertensive heart and chronic kidney disease with heart failure and stage 1 through stage 4 chronic kidney disease, or unspecified chronic kidney disease: Secondary | ICD-10-CM | POA: Diagnosis present

## 2021-03-28 DIAGNOSIS — N1832 Chronic kidney disease, stage 3b: Secondary | ICD-10-CM | POA: Diagnosis present

## 2021-03-28 DIAGNOSIS — Z888 Allergy status to other drugs, medicaments and biological substances status: Secondary | ICD-10-CM

## 2021-03-28 DIAGNOSIS — Z818 Family history of other mental and behavioral disorders: Secondary | ICD-10-CM

## 2021-03-28 DIAGNOSIS — F250 Schizoaffective disorder, bipolar type: Secondary | ICD-10-CM | POA: Diagnosis present

## 2021-03-28 LAB — COMPREHENSIVE METABOLIC PANEL
ALT: 24 U/L (ref 0–44)
AST: 34 U/L (ref 15–41)
Albumin: 3.4 g/dL — ABNORMAL LOW (ref 3.5–5.0)
Alkaline Phosphatase: 202 U/L — ABNORMAL HIGH (ref 38–126)
Anion gap: 9 (ref 5–15)
BUN: 29 mg/dL — ABNORMAL HIGH (ref 8–23)
CO2: 27 mmol/L (ref 22–32)
Calcium: 9.7 mg/dL (ref 8.9–10.3)
Chloride: 103 mmol/L (ref 98–111)
Creatinine, Ser: 2.04 mg/dL — ABNORMAL HIGH (ref 0.44–1.00)
GFR, Estimated: 27 mL/min — ABNORMAL LOW (ref >60)
Glucose, Bld: 87 mg/dL (ref 70–99)
Potassium: 3.9 mmol/L (ref 3.5–5.1)
Sodium: 139 mmol/L (ref 135–145)
Total Bilirubin: 0.4 mg/dL (ref 0.3–1.2)
Total Protein: 6.6 g/dL (ref 6.5–8.1)

## 2021-03-28 LAB — CBC WITH DIFFERENTIAL/PLATELET
Abs Immature Granulocytes: 0.02 10*3/uL (ref 0.00–0.07)
Basophils Absolute: 0 10*3/uL (ref 0.0–0.1)
Basophils Relative: 1 %
Eosinophils Absolute: 0.2 10*3/uL (ref 0.0–0.5)
Eosinophils Relative: 4 %
HCT: 35 % — ABNORMAL LOW (ref 36.0–46.0)
Hemoglobin: 10.6 g/dL — ABNORMAL LOW (ref 12.0–15.0)
Immature Granulocytes: 0 %
Lymphocytes Relative: 33 %
Lymphs Abs: 2 10*3/uL (ref 0.7–4.0)
MCH: 27.3 pg (ref 26.0–34.0)
MCHC: 30.3 g/dL (ref 30.0–36.0)
MCV: 90.2 fL (ref 80.0–100.0)
Monocytes Absolute: 0.7 10*3/uL (ref 0.1–1.0)
Monocytes Relative: 11 %
Neutro Abs: 3.1 10*3/uL (ref 1.7–7.7)
Neutrophils Relative %: 51 %
Platelets: 405 10*3/uL — ABNORMAL HIGH (ref 150–400)
RBC: 3.88 MIL/uL (ref 3.87–5.11)
RDW: 15.6 % — ABNORMAL HIGH (ref 11.5–15.5)
WBC: 6.1 10*3/uL (ref 4.0–10.5)
nRBC: 0 % (ref 0.0–0.2)

## 2021-03-28 LAB — CBG MONITORING, ED: Glucose-Capillary: 74 mg/dL (ref 70–99)

## 2021-03-28 MED ORDER — FUROSEMIDE 10 MG/ML IJ SOLN
40.0000 mg | Freq: Once | INTRAMUSCULAR | Status: AC
  Administered 2021-03-28: 40 mg via INTRAVENOUS
  Filled 2021-03-28: qty 4

## 2021-03-28 MED ORDER — POTASSIUM CHLORIDE CRYS ER 20 MEQ PO TBCR
40.0000 meq | EXTENDED_RELEASE_TABLET | Freq: Once | ORAL | Status: AC
  Administered 2021-03-28: 40 meq via ORAL
  Filled 2021-03-28: qty 2

## 2021-03-28 NOTE — Progress Notes (Signed)
[name] sent SNF referrals out via hub.  ? ?Waiting for PT consult. ?

## 2021-03-28 NOTE — ED Provider Notes (Signed)
[facility] Provider Note  CSN: [id number] Arrival date & time: 03/28/21 1652  Chief Complaint(s) Leg Swelling  HPI Tiffany [name] is a 61 y.o. female who presents emergency department for evaluation of leg swelling and inability to walk.  Patient has had multiple ER presentations for chronic leg swelling and is not following up with her primary care physician.  Patient arrives somewhat somnolent and she states that this is due to her not sleeping over the last few days.  She states that she is no longer able to walk due to the pain in her legs.  She states that she has not followed up with her nephrologist to have discussions on metolazone reinitiation as previously described in some of my previous notes.  She denies chest pain, shortness of breath, headache, fever or other systemic symptoms.  HPI  Past Medical History Past Medical History:  Diagnosis Date   Agitation 11/22/2017   Anoxic brain injury ([place]) 09/08/2016   C. Arrest due to respiratory failure and COPD exacerbation   Anxiety    Arthritis    "all over" (04/10/2016)   Asthma 10/18/2010   Binge eating disorder    Cardiac arrest ([place]) 09/08/2016   PEA   Carotid artery stenosis    1-39% bilateral by dopplers 11/2016   Chronic diastolic (congestive) heart failure (HCC)    Chronic kidney disease, stage 3 (HCC)    Chronic pain syndrome 06/18/2012   Chronic post-traumatic stress disorder (PTSD) 05/27/2018   Chronic respiratory failure with hypoxia and hypercapnia ([place]) 06/22/2015   TRILOGY Vent >AVAPA-ES., Vt target 200-400, Max P 30 , PS max 20 , PS min 6-10 , E Max 6, E Min 4, Rate Auto AVAPS Rate 2 (titrate for pt comfort) , bleed O2 at 5l/m continuous flow .    Closed displaced fracture of fifth metacarpal bone 03/21/2018   Cocaine use disorder, severe, in sustained remission ([place]) 46/27/0350   Complication of anesthesia    decreased bp, decreased heart rate   COPD (chronic  obstructive pulmonary disease) ([place]) 07/08/2014   Depression    Diabetic neuropathy ([place]) 04/24/2011   Difficulty with speech 01/24/2018   Disorder of nervous system    Drug abuse ([place]) 11/21/2017   Dyslipidemia 04/24/2011   Elevated troponin 04/28/2012   Emphysema    Encephalopathy 11/21/2017   Essential hypertension 03/22/2016   Fibula fracture 07/10/2016   Frequent falls 10/11/2017   GERD (gastroesophageal reflux disease)    Gout 04/11/2017   Heart attack ([place]) 1980s   History of blood transfusion 1994   "couldn't stop bleeding from my period"   History of drug abuse in remission ([place]) 11/28/2015   Quit in 2017   Hyperlipidemia LDL goal <70    Incontinence    Manic depression ([place])    Morbid obesity ([place]) 10/18/2010   Obstructive sleep apnea 10/18/2010   On home oxygen therapy    "6L; 24/7" (04/10/2016)   OSA on CPAP    "wear mask sometimes" (04/10/2016)   Paranoid ([place])    "sometimes; I'm on RX for it" (04/10/2016)   Prolonged Q-T interval on ECG    Rectal bleeding 12/31/2015   Schizoaffective disorder, bipolar type ([place]) 04/05/2018   Seasonal allergies    Seborrheic keratoses 12/31/2013   Seizures ([place])    "don't know what kind; last one was ~ 1 yr ago" (04/10/2016)   Sinus bradycardia    Stroke [facility]) 1980s   denies residual on 04/10/2016   Thrush  09/19/2013   Type 2 diabetes mellitus ([name]) 10/18/2010   Patient Active Problem List   Diagnosis Date Noted   Atypical chest pain 01/13/2021   GERD (gastroesophageal reflux disease)    Neuropathy 11/18/2020   Pain in both feet 11/18/2020   Localized edema 11/18/2020   Encounter for medication management 11/17/2020   Protein-calorie malnutrition, severe 05/15/2020   Malnutrition of moderate degree 62/95/2841   Periumbilical abdominal pain    Dysphagia    Nausea vomiting and diarrhea    Generalized abdominal pain    E. coli UTI 32/44/0102   Acute metabolic encephalopathy 72/53/6644   Psychosis ([place]) 02/20/2020    Schizophrenia, schizo-affective ([place]) 03/47/4259   Metabolic acidosis, normal anion gap (NAG) 02/20/2020   Altered mental status    Hypocalcemia    AKI (acute kidney injury) ([place]) 02/18/2020   Pelvic mass in female 01/02/2020   Genital herpes 11/25/2019   Stage 2 skin ulcer of sacral region ([place]) 10/28/2019   ARF (acute renal failure) ([place]) 10/27/2019   Family discord 02/04/2019   Aggressive behavior    PTSD (post-traumatic stress disorder) 05/27/2018   Schizoaffective disorder, bipolar type ([place]) 04/05/2018   Closed displaced fracture of fifth metacarpal bone 03/21/2018   Difficulty with speech 01/24/2018   Encephalopathy 11/21/2017   Drug abuse ([place]) 11/21/2017   Frequent falls 10/11/2017   Binge eating disorder    Dependence on continuous supplemental oxygen 05/14/2017   Gout 04/11/2017   Hypomagnesemia    CKD (chronic kidney disease) stage 3, GFR 30-59 ml/min (HCC) 12/15/2016   Carotid artery stenosis    Hypokalemia    Osteoarthritis 10/26/2016   Anoxic brain injury ([place]) 09/08/2016   Overactive bladder 06/07/2016   QT prolongation    OSA and COPD overlap syndrome ([place])    Arthritis    Essential hypertension 03/22/2016   Cocaine use disorder, severe, in sustained remission ([place]) 12/17/2015   History of drug abuse in remission ([place]) 11/28/2015   Hyponatremia 11/25/2015   Chronic diastolic congestive heart failure (HCC)    Chronic respiratory failure with hypoxia ([place]) 06/22/2015   Tobacco use disorder 07/22/2014   COPD (chronic obstructive pulmonary disease) ([place]) 07/08/2014   Seizure ([place]) 01/04/2013   Chronic pain syndrome 06/18/2012   Dyslipidemia 04/24/2011   Anemia 04/24/2011   Diabetic neuropathy ([place]) 04/24/2011   Morbid obesity ([place]) 10/18/2010   Type 2 diabetes mellitus ([place]) 10/18/2010   Home Medication(s) Prior to Admission medications   Medication Sig Start Date End Date Taking? Authorizing Provider  albuterol (PROVENTIL) (2.5 MG/3ML) 0.083% nebulizer  solution Take 3 mLs (2.5 mg total) by nebulization every 6 (six) hours as needed for wheezing or shortness of breath. 12/16/20   [name], MD  albuterol (VENTOLIN HFA) 108 (90 Base) MCG/ACT inhaler Inhale 2 puffs into the lungs every 6 (six) hours as needed for wheezing or shortness of breath. 02/09/21   [name], MD  amLODipine (NORVASC) 10 MG tablet TAKE 1 TABLET(10 MG) BY MOUTH DAILY 03/16/21   [name], MD  asenapine (SAPHRIS) 5 MG SUBL 24 hr tablet Place 2 tablets (10 mg total) under the tongue 2 (two) times daily. 01/14/21   Mercy Riding, MD  atorvastatin (LIPITOR) 20 MG tablet Take 1 tablet (20 mg total) by mouth at bedtime. 01/14/21   Mercy Riding, MD  colchicine 0.6 MG tablet Take 2 tablets by mouth at the onset of gout flare, may repeat 1 tablet in 1 hour if symptoms persist 01/14/21   Wendee Beavers  T, MD  cyanocobalamin 1000 MCG tablet Take 1 tablet (1,000 mcg total) by mouth daily. 01/14/21   Mercy Riding, MD  cyclobenzaprine (FLEXERIL) 10 MG tablet Take 1 tablet (10 mg total) by mouth 2 (two) times daily as needed. 02/09/21   [name], MD  diclofenac (VOLTAREN) 75 MG EC tablet TAKE 1 TABLET (75 MG) BY MOUTH TWICE DAILY AS NEEDED 02/09/21   [name], MD  diclofenac Sodium (VOLTAREN) 1 % GEL Apply 4 g topically 4 (four) times daily. 03/01/21   [name], MD  dicyclomine (BENTYL) 10 MG capsule Take 1 capsule (10 mg total) by mouth 3 (three) times daily before meals. 12/24/20   [name], MD  escitalopram (LEXAPRO) 10 MG tablet Take 1 tablet (10 mg total) by mouth daily. 02/09/21 03/11/21  [name], MD  febuxostat (ULORIC) 40 MG tablet Take 1 tablet (40 mg total) by mouth daily. 02/18/21   [name], MD  Fluticasone-Umeclidin-Vilant (TRELEGY ELLIPTA) 100-62.5-25 MCG/ACT AEPB Inhale 1 puff into the lungs daily. 01/14/21   Mercy Riding, MD  furosemide (LASIX) 40 MG tablet Take 1 tablet (40 mg total) by mouth daily. 01/17/21 03/18/21  Mercy Riding,  MD  gabapentin (NEURONTIN) 600 MG tablet Take 1 tablet (600 mg total) by mouth 2 (two) times daily. 02/09/21   [name], MD  Iron, Ferrous Sulfate, 325 (65 Fe) MG TABS Take 325 mg by mouth daily. 01/14/21   Mercy Riding, MD  ketoconazole (NIZORAL) 2 % cream Apply 1 application topically daily. Apply to both feet 09/27/20   [provider]  lidocaine (LIDODERM) 5 % Place 1 patch onto the skin daily. Remove & Discard patch within 12 hours or as directed by MD 12/26/20   Henderly, Britni A, PA-C  montelukast (SINGULAIR) 10 MG tablet Take 1 tablet (10 mg total) by mouth at bedtime. 01/14/21   Mercy Riding, MD  Multiple Vitamin (MULTIVITAMIN WITH MINERALS) TABS tablet Take 1 tablet by mouth daily. 01/14/21   Mercy Riding, MD  omeprazole (PRILOSEC) 40 MG capsule TAKE 1 CAPSULE(40 MG) BY MOUTH DAILY Patient taking differently: Take 40 mg by mouth daily. 10/23/20   [name], MD  oxybutynin (DITROPAN) 5 MG tablet TAKE 1 TABLET(5 MG) BY MOUTH TWICE DAILY Patient taking differently: Take 5 mg by mouth 2 (two) times daily. 10/23/20   [name], MD  potassium chloride SA (KLOR-CON M) 20 MEQ tablet Take 1 tablet (20 mEq total) by mouth daily. 01/17/21 02/16/21  Mercy Riding, MD  prazosin (MINIPRESS) 2 MG capsule Take 1 capsule (2 mg total) by mouth at bedtime. 02/14/21   [name], MD  sodium chloride (OCEAN) 0.65 % SOLN nasal spray Place 2 sprays into both nostrils daily.    [provider]  terbinafine (LAMISIL AT) 1 % cream Apply 1 application topically 2 (two) times daily. Patient taking differently: Apply 1 application topically 3 (three) times daily. 09/07/20   [name], MD  thiamine 100 MG tablet Take 1 tablet (100 mg total) by mouth daily. 02/27/20   Hongalgi, Lenis Dickinson, MD  torsemide (DEMADEX) 20 MG tablet Take 1 tablet (20 mg total) by mouth daily. 02/25/21   Margarita Mail, PA-C  Past Surgical History Past Surgical History:  Procedure Laterality Date   CESAREAN SECTION  1997   HERNIA REPAIR     IR CHOLANGIOGRAM EXISTING TUBE  07/20/2016   IR PERC CHOLECYSTOSTOMY  05/10/2016   IR RADIOLOGIST EVAL & MGMT  06/08/2016   IR RADIOLOGIST EVAL & MGMT  06/29/2016   IR SINUS/FIST TUBE CHK-NON GI  07/12/2016   RIGHT/LEFT HEART CATH AND CORONARY ANGIOGRAPHY N/A 06/19/2017   Procedure: RIGHT/LEFT HEART CATH AND CORONARY ANGIOGRAPHY;  Surgeon: Jolaine Artist, MD;  Location: [facility];  Service: Cardiovascular;  Laterality: N/A;   TIBIA IM NAIL INSERTION Right 07/12/2016   Procedure: INTRAMEDULLARY (IM) NAIL RIGHT TIBIA;  Surgeon: Leandrew Koyanagi, MD;  Location: [facility];  Service: Orthopedics;  Laterality: Right;   UMBILICAL HERNIA REPAIR  ~ 1963   "that's why I don't have a belly button"   VAGINAL HYSTERECTOMY     Family History Family History  Problem Relation Age of Onset   Cancer Mother        lung   Depression Mother    Cancer Father        prostate   Depression Sister    Anxiety disorder Sister    Schizophrenia Sister    Bipolar disorder Sister    Depression Sister    Depression Brother    Heart failure Other        cousin    Social History Social History   Tobacco Use   Smoking status: Former    Packs/day: 1.50    Years: 38.00    Pack years: 57.00    Types: Cigarettes    Start date: 03/13/1977    Quit date: 04/10/2016    Years since quitting: 4.9   Smokeless tobacco: Never  Vaping Use   Vaping Use: Never used  Substance Use Topics   Alcohol use: No    Alcohol/week: 0.0 standard drinks   Drug use: Not Currently    Types: Cocaine    Comment: 04/10/2016 "last used cocaine back in November 2017"   Allergies Hydrocodone, Hydroxyzine, Latuda [lurasidone hcl], Magnesium-containing compounds, Prednisone, Tramadol, Codeine, Trazodone, Topamax [topiramate], Sulfa antibiotics, and Tape  Review of  Systems Review of Systems  Cardiovascular:  Positive for leg swelling.   Physical Exam Vital Signs  I have reviewed the triage vital signs BP 131/80 (BP Location: Right Arm)    Pulse 74    Temp 98.7 F (37.1 C) (Oral)    Resp 16    SpO2 94%   Physical Exam Vitals and nursing note reviewed.  Constitutional:      General: [name] not in acute distress.    Appearance: [name] well-developed.  HENT:     Head: Normocephalic and atraumatic.  Eyes:     Conjunctiva/sclera: Conjunctivae normal.  Cardiovascular:     Rate and Rhythm: Normal rate and regular rhythm.     Heart sounds: No murmur heard. Pulmonary:     Effort: Pulmonary effort is normal. No respiratory distress.     Breath sounds: Normal breath sounds.  Abdominal:     Palpations: Abdomen is soft.     Tenderness: There is no abdominal tenderness.  Musculoskeletal:        General: No swelling.     Cervical back: Neck supple.     Right lower leg: Edema present.     Left lower leg: Edema present.  Skin:    General: Skin is warm and dry.     Capillary Refill:  Capillary refill takes less than 2 seconds.  Neurological:     Mental Status: [name] alert.  Psychiatric:        Mood and Affect: Mood normal.    ED Results and Treatments Labs (all labs ordered are listed, but only abnormal results are displayed) Labs Reviewed  COMPREHENSIVE METABOLIC PANEL - Abnormal; Notable for the following components:      Result Value   BUN 29 (*)    Creatinine, Ser 2.04 (*)    Albumin 3.4 (*)    Alkaline Phosphatase 202 (*)    GFR, Estimated 27 (*)    All other components within normal limits  CBC WITH DIFFERENTIAL/PLATELET - Abnormal; Notable for the following components:   Hemoglobin 10.6 (*)    HCT 35.0 (*)    RDW 15.6 (*)    Platelets 405 (*)    All other components within normal limits  URINALYSIS, ROUTINE W REFLEX MICROSCOPIC  CBG MONITORING, ED                                                                                                                           Radiology No results found.  Pertinent labs & imaging results that were available during my care of the patient were reviewed by me and considered in my medical decision making (see MDM for details).  Medications Ordered in ED Medications  furosemide (LASIX) injection 40 mg (40 mg Intravenous Given 03/28/21 2005)  potassium chloride SA (KLOR-CON M) CR tablet 40 mEq (40 mEq Oral Given 03/28/21 2009)                                                                                                                                     Procedures Procedures  (including critical care time)  Medical Decision Making / ED Course   This patient presents to the ED for concern of leg swelling, this involves an extensive number of treatment options, and is a complaint that carries with it a high risk of complications and morbidity.  The differential diagnosis includes known lower extremity edema, medication noncompliance, renal failure  MDM: Patient seen emergency department for evaluation of lower extremity edema.  Physical exam reveals 1+ pitting edema bilaterally but is otherwise unremarkable.  Laboratory evaluation unremarkable outside of a minimally elevated creatinine to 2.04 up from her baseline of 1.8.  Potassium 3.9.  Hemoglobin 10.6.  Patient  given her home Lasix and potassium repleted.  On reevaluation, patient is very concerned about her inability to walk and as she has had greater than 10 ER visits for this same complaint I am highly concerned that if she is discharged today back home she will represent to the emergency department within 48 hours.  Thus, TOC was consulted as well as physical therapy.  TOC believes that she will likely require SNF placement but this will need to be guided by physical therapy evaluation.  Patient will be evaluate by physical therapy in the morning and patient signed out to oncoming provider.  Please see provider signout for  continuation of work-up.   Additional history obtained:  -External records from outside source obtained and reviewed including: Chart review including previous notes, labs, imaging, consultation notes   Lab Tests: -I ordered, reviewed, and interpreted labs.   The pertinent results include:   Labs Reviewed  COMPREHENSIVE METABOLIC PANEL - Abnormal; Notable for the following components:      Result Value   BUN 29 (*)    Creatinine, Ser 2.04 (*)    Albumin 3.4 (*)    Alkaline Phosphatase 202 (*)    GFR, Estimated 27 (*)    All other components within normal limits  CBC WITH DIFFERENTIAL/PLATELET - Abnormal; Notable for the following components:   Hemoglobin 10.6 (*)    HCT 35.0 (*)    RDW 15.6 (*)    Platelets 405 (*)    All other components within normal limits  URINALYSIS, ROUTINE W REFLEX MICROSCOPIC  CBG MONITORING, ED     Medicines ordered and prescription drug management: Meds ordered this encounter  Medications   furosemide (LASIX) injection 40 mg   potassium chloride SA (KLOR-CON M) CR tablet 40 mEq    -I have reviewed the patients home medicines and have made adjustments as needed  Critical interventions none  Consultations Obtained: I requested consultation with the TOC and PT,  and discussed lab and imaging findings as well as pertinent plan - they recommend: possible SNF placement pending PT eval   Social Determinants of Health:  Factors impacting patients care include: lives alone   Reevaluation: After the interventions noted above, I reevaluated the patient and found that they have :stayed the same  Co morbidities that complicate the patient evaluation  Past Medical History:  Diagnosis Date   Agitation 11/22/2017   Anoxic brain injury ([place]) 09/08/2016   C. Arrest due to respiratory failure and COPD exacerbation   Anxiety    Arthritis    "all over" (04/10/2016)   Asthma 10/18/2010   Binge eating disorder    Cardiac arrest ([place]) 09/08/2016    PEA   Carotid artery stenosis    1-39% bilateral by dopplers 11/2016   Chronic diastolic (congestive) heart failure (HCC)    Chronic kidney disease, stage 3 (HCC)    Chronic pain syndrome 06/18/2012   Chronic post-traumatic stress disorder (PTSD) 05/27/2018   Chronic respiratory failure with hypoxia and hypercapnia ([place]) 06/22/2015   TRILOGY Vent >AVAPA-ES., Vt target 200-400, Max P 30 , PS max 20 , PS min 6-10 , E Max 6, E Min 4, Rate Auto AVAPS Rate 2 (titrate for pt comfort) , bleed O2 at 5l/m continuous flow .    Closed displaced fracture of fifth metacarpal bone 03/21/2018   Cocaine use disorder, severe, in sustained remission ([place]) 02/58/5277   Complication of anesthesia    decreased bp, decreased heart rate   COPD (chronic obstructive pulmonary  disease) ([place]) 07/08/2014   Depression    Diabetic neuropathy ([place]) 04/24/2011   Difficulty with speech 01/24/2018   Disorder of nervous system    Drug abuse ([place]) 11/21/2017   Dyslipidemia 04/24/2011   Elevated troponin 04/28/2012   Emphysema    Encephalopathy 11/21/2017   Essential hypertension 03/22/2016   Fibula fracture 07/10/2016   Frequent falls 10/11/2017   GERD (gastroesophageal reflux disease)    Gout 04/11/2017   Heart attack ([place]) 1980s   History of blood transfusion 1994   "couldn't stop bleeding from my period"   History of drug abuse in remission ([place]) 11/28/2015   Quit in 2017   Hyperlipidemia LDL goal <70    Incontinence    Manic depression ([place])    Morbid obesity ([place]) 10/18/2010   Obstructive sleep apnea 10/18/2010   On home oxygen therapy    "6L; 24/7" (04/10/2016)   OSA on CPAP    "wear mask sometimes" (04/10/2016)   Paranoid ([place])    "sometimes; I'm on RX for it" (04/10/2016)   Prolonged Q-T interval on ECG    Rectal bleeding 12/31/2015   Schizoaffective disorder, bipolar type ([employer]) 04/05/2018   Seasonal allergies    Seborrheic keratoses 12/31/2013   Seizures ([place])    "don't know what kind; last one  was ~ 1 yr ago" (04/10/2016)   Sinus bradycardia    Stroke [facility]) 1980s   denies residual on 04/10/2016   Thrush 09/19/2013   Type 2 diabetes mellitus ([place]) 10/18/2010      Dispostion: I considered admission for this patient, and disposition will be pending PT eval     Final Clinical Impression(s) / ED Diagnoses Final diagnoses:  None     '@PCDICTATION'$ @    Teressa Lower, MD 03/28/21 2336
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
## 2021-03-28 NOTE — NC FL2 (Signed)
?[place] MEDICAID FL2 LEVEL OF CARE SCREENING TOOL  ?  ? ?IDENTIFICATION  ?Patient Name: ?Tiffany [name] Birthdate: [birth year] Sex: female Admission Date (Current Location): ?03/28/2021  ?[place] Number: ? [place] ?  Facility and Address:  ?The [place]. [facility], [place] [address], [place], [place] [address] ?     Provider Number: ?[id number]  ?Attending Physician Name and Address:  ?Teressa Lower, MD ? Relative Name and Phone Number:  ?  ?   ?Current Level of Care: ?SNF Recommended Level of Care: ?[place] Prior Approval Number: ?  ? ?Date Approved/Denied: ?  PASRR Number: ?Pending ? ?Discharge Plan: ?SNF ?  ? ?Current Diagnoses: ?Patient Active Problem List  ? Diagnosis Date Noted  ? Atypical chest pain 01/13/2021  ? GERD (gastroesophageal reflux disease)   ? Neuropathy 11/18/2020  ? Pain in both feet 11/18/2020  ? Localized edema 11/18/2020  ? Encounter for medication management 11/17/2020  ? Protein-calorie malnutrition, severe 05/15/2020  ? Malnutrition of moderate degree 05/13/2020  ? Periumbilical abdominal pain   ? Dysphagia   ? Nausea vomiting and diarrhea   ? Generalized abdominal pain   ? E. coli UTI 02/20/2020  ? Acute metabolic encephalopathy 51/70/0174  ? Psychosis ([place]) 02/20/2020  ? Schizophrenia, schizo-affective ([place]) 02/20/2020  ? Metabolic acidosis, normal anion gap (NAG) 02/20/2020  ? Altered mental status   ? Hypocalcemia   ? AKI (acute kidney injury) ([place]) 02/18/2020  ? Pelvic mass in female 01/02/2020  ? Genital herpes 11/25/2019  ? Stage 2 skin ulcer of sacral region ([place]) 10/28/2019  ? ARF (acute renal failure) ([place]) 10/27/2019  ? Family discord 02/04/2019  ? Aggressive behavior   ? PTSD (post-traumatic stress disorder) 05/27/2018  ? Schizoaffective disorder, bipolar type ([place]) 04/05/2018  ? Closed displaced fracture of fifth metacarpal bone 03/21/2018  ? Difficulty with speech 01/24/2018  ? Encephalopathy 11/21/2017  ? Drug abuse ([place]) 11/21/2017   ? Frequent falls 10/11/2017  ? Binge eating disorder   ? Dependence on continuous supplemental oxygen 05/14/2017  ? Gout 04/11/2017  ? Hypomagnesemia   ? CKD (chronic kidney disease) stage 3, GFR 30-59 ml/min (HCC) 12/15/2016  ? Carotid artery stenosis   ? Hypokalemia   ? Osteoarthritis 10/26/2016  ? Anoxic brain injury ([place]) 09/08/2016  ? Overactive bladder 06/07/2016  ? QT prolongation   ? OSA and COPD overlap syndrome ([place])   ? Arthritis   ? Essential hypertension 03/22/2016  ? Cocaine use disorder, severe, in sustained remission ([place]) 12/17/2015  ? History of drug abuse in remission ([place]) 11/28/2015  ? Hyponatremia 11/25/2015  ? Chronic diastolic congestive heart failure ([place])   ? Chronic respiratory failure with hypoxia ([place]) 06/22/2015  ? Tobacco use disorder 07/22/2014  ? COPD (chronic obstructive pulmonary disease) ([place]) 07/08/2014  ? Seizure ([place]) 01/04/2013  ? Chronic pain syndrome 06/18/2012  ? Dyslipidemia 04/24/2011  ? Anemia 04/24/2011  ? Diabetic neuropathy ([place]) 04/24/2011  ? Morbid obesity ([place]) 10/18/2010  ? Type 2 diabetes mellitus ([place]) 10/18/2010  ? ? ?Orientation RESPIRATION BLADDER Height & Weight   ?  ?Self, Time, Situation, Place ? Normal Incontinent Weight:   ?Height:     ?BEHAVIORAL SYMPTOMS/MOOD NEUROLOGICAL BOWEL NUTRITION STATUS  ?    Incontinent Diet (regular)  ?AMBULATORY STATUS COMMUNICATION OF NEEDS Skin   ?Extensive Assist Verbally Normal ?  ?  ?  ?    ?     ?     ? ? ?Personal Care Assistance Level  of Assistance  ?Bathing, Feeding, Dressing Bathing Assistance: Limited assistance ?Feeding assistance: Independent ?Dressing Assistance: Limited assistance ?   ? ?Functional Limitations Info  ?Sight, Hearing, Speech Sight Info: Impaired (wears glasses) ?Hearing Info: Adequate ?Speech Info: Adequate  ? ? ?SPECIAL CARE FACTORS FREQUENCY  ?PT (By licensed PT), OT (By licensed OT)   ?  ?PT Frequency: 5x weekly ?OT Frequency: 5x weekly ?  ?  ?  ?   ? ? ?Contractures Contractures Info: Not  present  ? ? ?Additional Factors Info  ?Code Status, Allergies Code Status Info: Full ?Allergies Info: Hydrocodone,Hydroxyzine, Latuda,Magnesium-containing Compounds,Prednisone,Tramadol,Trazadone, Topamax,Sulfa Antibiotics,Codeine, Tape ?  ?  ?  ?   ? ?Current Medications (03/28/2021):  This is the current hospital active medication list ?No current facility-administered medications for this encounter.  ? ?Current Outpatient Medications  ?Medication Sig Dispense Refill  ? albuterol (PROVENTIL) (2.5 MG/3ML) 0.083% nebulizer solution Take 3 mLs (2.5 mg total) by nebulization every 6 (six) hours as needed for wheezing or shortness of breath. 360 mL 5  ? albuterol (VENTOLIN HFA) 108 (90 Base) MCG/ACT inhaler Inhale 2 puffs into the lungs every 6 (six) hours as needed for wheezing or shortness of breath. 8.5 g 2  ? amLODipine (NORVASC) 10 MG tablet TAKE 1 TABLET(10 MG) BY MOUTH DAILY 90 tablet 0  ? asenapine (SAPHRIS) 5 MG SUBL 24 hr tablet Place 2 tablets (10 mg total) under the tongue 2 (two) times daily. 60 tablet 0  ? atorvastatin (LIPITOR) 20 MG tablet Take 1 tablet (20 mg total) by mouth at bedtime. 30 tablet 1  ? colchicine 0.6 MG tablet Take 2 tablets by mouth at the onset of gout flare, may repeat 1 tablet in 1 hour if symptoms persist 15 tablet 0  ? cyanocobalamin 1000 MCG tablet Take 1 tablet (1,000 mcg total) by mouth daily. 90 tablet 1  ? cyclobenzaprine (FLEXERIL) 10 MG tablet Take 1 tablet (10 mg total) by mouth 2 (two) times daily as needed. 180 tablet 0  ? diclofenac (VOLTAREN) 75 MG EC tablet TAKE 1 TABLET (75 MG) BY MOUTH TWICE DAILY AS NEEDED 60 tablet 1  ? diclofenac Sodium (VOLTAREN) 1 % GEL Apply 4 g topically 4 (four) times daily. 100 g 0  ? dicyclomine (BENTYL) 10 MG capsule Take 1 capsule (10 mg total) by mouth 3 (three) times daily before meals. 90 capsule 6  ? escitalopram (LEXAPRO) 10 MG tablet Take 1 tablet (10 mg total) by mouth daily. 90 tablet 0  ? febuxostat (ULORIC) 40 MG tablet Take 1  tablet (40 mg total) by mouth daily. 30 tablet 1  ? Fluticasone-Umeclidin-Vilant (TRELEGY ELLIPTA) 100-62.5-25 MCG/ACT AEPB Inhale 1 puff into the lungs daily. 60 each 6  ? furosemide (LASIX) 40 MG tablet Take 1 tablet (40 mg total) by mouth daily. 30 tablet 1  ? gabapentin (NEURONTIN) 600 MG tablet Take 1 tablet (600 mg total) by mouth 2 (two) times daily. 60 tablet 1  ? Iron, Ferrous Sulfate, 325 (65 Fe) MG TABS Take 325 mg by mouth daily. 60 tablet 2  ? ketoconazole (NIZORAL) 2 % cream Apply 1 application topically daily. Apply to both feet    ? lidocaine (LIDODERM) 5 % Place 1 patch onto the skin daily. Remove & Discard patch within 12 hours or as directed by MD 30 patch 1  ? montelukast (SINGULAIR) 10 MG tablet Take 1 tablet (10 mg total) by mouth at bedtime. 30 tablet 6  ? Multiple Vitamin (MULTIVITAMIN WITH MINERALS) TABS tablet  Take 1 tablet by mouth daily.    ? omeprazole (PRILOSEC) 40 MG capsule TAKE 1 CAPSULE(40 MG) BY MOUTH DAILY (Patient taking differently: Take 40 mg by mouth daily.) 30 capsule 5  ? oxybutynin (DITROPAN) 5 MG tablet TAKE 1 TABLET(5 MG) BY MOUTH TWICE DAILY (Patient taking differently: Take 5 mg by mouth 2 (two) times daily.) 60 tablet 5  ? potassium chloride SA (KLOR-CON M) 20 MEQ tablet Take 1 tablet (20 mEq total) by mouth daily. 30 tablet 0  ? prazosin (MINIPRESS) 2 MG capsule Take 1 capsule (2 mg total) by mouth at bedtime. 30 capsule 0  ? sodium chloride (OCEAN) 0.65 % SOLN nasal spray Place 2 sprays into both nostrils daily.    ? terbinafine (LAMISIL AT) 1 % cream Apply 1 application topically 2 (two) times daily. (Patient taking differently: Apply 1 application topically 3 (three) times daily.) 30 g 1  ? thiamine 100 MG tablet Take 1 tablet (100 mg total) by mouth daily.    ? torsemide (DEMADEX) 20 MG tablet Take 1 tablet (20 mg total) by mouth daily. 6 tablet 0  ? ? ? ?Discharge Medications: ?Please see discharge summary for a list of discharge medications. ? ?Relevant Imaging  Results: ? ?Relevant Lab Results: ? ? ?Additional Information ?SS#  [id number]/ Pt has had 2 Covid shots/ Ht & Wt 5'2" 165lbs ? ?Irish Breisch, LCSW ? ? ? ? ?

## 2021-03-28 NOTE — ED Notes (Signed)
Pt sleeping at this time.
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
## 2021-03-29 ENCOUNTER — Encounter (HOSPITAL_COMMUNITY): Payer: Self-pay

## 2021-03-29 ENCOUNTER — Telehealth: Payer: Self-pay | Admitting: Nurse Practitioner

## 2021-03-29 ENCOUNTER — Ambulatory Visit: Payer: Medicare Other | Attending: Nurse Practitioner | Admitting: Nurse Practitioner

## 2021-03-29 ENCOUNTER — Emergency Department (HOSPITAL_COMMUNITY): Payer: Medicare Other

## 2021-03-29 DIAGNOSIS — I10 Essential (primary) hypertension: Secondary | ICD-10-CM

## 2021-03-29 DIAGNOSIS — E1142 Type 2 diabetes mellitus with diabetic polyneuropathy: Secondary | ICD-10-CM | POA: Diagnosis not present

## 2021-03-29 DIAGNOSIS — D649 Anemia, unspecified: Secondary | ICD-10-CM | POA: Diagnosis not present

## 2021-03-29 DIAGNOSIS — F259 Schizoaffective disorder, unspecified: Secondary | ICD-10-CM

## 2021-03-29 DIAGNOSIS — J42 Unspecified chronic bronchitis: Secondary | ICD-10-CM

## 2021-03-29 DIAGNOSIS — K922 Gastrointestinal hemorrhage, unspecified: Secondary | ICD-10-CM

## 2021-03-29 HISTORY — DX: Gastrointestinal hemorrhage, unspecified: K92.2

## 2021-03-29 LAB — CBC WITH DIFFERENTIAL/PLATELET
Abs Immature Granulocytes: 0.02 10*3/uL (ref 0.00–0.07)
Basophils Absolute: 0 10*3/uL (ref 0.0–0.1)
Basophils Relative: 1 %
Eosinophils Absolute: 0.2 10*3/uL (ref 0.0–0.5)
Eosinophils Relative: 5 %
HCT: 31.7 % — ABNORMAL LOW (ref 36.0–46.0)
Hemoglobin: 9.5 g/dL — ABNORMAL LOW (ref 12.0–15.0)
Immature Granulocytes: 1 %
Lymphocytes Relative: 26 %
Lymphs Abs: 1.1 10*3/uL (ref 0.7–4.0)
MCH: 27.1 pg (ref 26.0–34.0)
MCHC: 30 g/dL (ref 30.0–36.0)
MCV: 90.6 fL (ref 80.0–100.0)
Monocytes Absolute: 0.4 10*3/uL (ref 0.1–1.0)
Monocytes Relative: 10 %
Neutro Abs: 2.4 10*3/uL (ref 1.7–7.7)
Neutrophils Relative %: 57 %
Platelets: 387 10*3/uL (ref 150–400)
RBC: 3.5 MIL/uL — ABNORMAL LOW (ref 3.87–5.11)
RDW: 15.6 % — ABNORMAL HIGH (ref 11.5–15.5)
WBC: 4.2 10*3/uL (ref 4.0–10.5)
nRBC: 0 % (ref 0.0–0.2)

## 2021-03-29 LAB — COMPREHENSIVE METABOLIC PANEL
ALT: 19 U/L (ref 0–44)
AST: 27 U/L (ref 15–41)
Albumin: 3.1 g/dL — ABNORMAL LOW (ref 3.5–5.0)
Alkaline Phosphatase: 178 U/L — ABNORMAL HIGH (ref 38–126)
Anion gap: 9 (ref 5–15)
BUN: 33 mg/dL — ABNORMAL HIGH (ref 8–23)
CO2: 24 mmol/L (ref 22–32)
Calcium: 8.9 mg/dL (ref 8.9–10.3)
Chloride: 108 mmol/L (ref 98–111)
Creatinine, Ser: 1.76 mg/dL — ABNORMAL HIGH (ref 0.44–1.00)
GFR, Estimated: 33 mL/min — ABNORMAL LOW (ref >60)
Glucose, Bld: 99 mg/dL (ref 70–99)
Potassium: 4 mmol/L (ref 3.5–5.1)
Sodium: 141 mmol/L (ref 135–145)
Total Bilirubin: 0.3 mg/dL (ref 0.3–1.2)
Total Protein: 5.8 g/dL — ABNORMAL LOW (ref 6.5–8.1)

## 2021-03-29 LAB — PROTIME-INR
INR: 1.2 (ref 0.8–1.2)
Prothrombin Time: 14.7 seconds (ref 11.4–15.2)

## 2021-03-29 LAB — LIPASE, BLOOD: Lipase: 34 U/L (ref 11–51)

## 2021-03-29 MED ORDER — FENTANYL CITRATE PF 50 MCG/ML IJ SOSY
50.0000 ug | PREFILLED_SYRINGE | Freq: Once | INTRAMUSCULAR | Status: AC
  Administered 2021-03-29: 50 ug via INTRAVENOUS
  Filled 2021-03-29: qty 1

## 2021-03-29 MED ORDER — FENTANYL CITRATE PF 50 MCG/ML IJ SOSY
25.0000 ug | PREFILLED_SYRINGE | Freq: Once | INTRAMUSCULAR | Status: AC
  Administered 2021-03-29: 25 ug via INTRAVENOUS
  Filled 2021-03-29: qty 1

## 2021-03-29 NOTE — ED Notes (Signed)
Pt is on a clear liquid until admitting providers discuss care with pt. ?

## 2021-03-29 NOTE — Telephone Encounter (Signed)
Patient is currently in the Emergency room awaiting PT CONSULT to determine need for SNF ?

## 2021-03-29 NOTE — NC FL2 (Signed)
?[place] MEDICAID FL2 LEVEL OF CARE SCREENING TOOL  ?  ? ?IDENTIFICATION  ?Patient Name: ?Tiffany [name] Birthdate: 1960-02-20 Sex: female Admission Date (Current Location): ?03/28/2021  ?[place] Number: ? [place] ?  Facility and Address:  ?[facility] [place]. [facility], [place] [address], [place], [place] [address] ?     Provider Number: ?[id number]  ?Attending Physician Name and Address:  ?Lajean Saver, MD ? Relative Name and Phone Number:  ?  ?   ?Current Level of Care: ?Hospital Recommended Level of Care: ?[place] Prior Approval Number: ?  ? ?Date Approved/Denied: ?  PASRR Number: ?Pending ? ?Discharge Plan: ?SNF ?  ? ?Current Diagnoses: ?Patient Active Problem List  ? Diagnosis Date Noted  ? Atypical chest pain 01/13/2021  ? GERD (gastroesophageal reflux disease)   ? Neuropathy 11/18/2020  ? Pain in both feet 11/18/2020  ? Localized edema 11/18/2020  ? Encounter for medication management 11/17/2020  ? Protein-calorie malnutrition, severe 05/15/2020  ? Malnutrition of moderate degree 05/13/2020  ? Periumbilical abdominal pain   ? Dysphagia   ? Nausea vomiting and diarrhea   ? Generalized abdominal pain   ? E. coli UTI 02/20/2020  ? Acute metabolic encephalopathy 09/32/3557  ? Psychosis ([place]) 02/20/2020  ? Schizophrenia, schizo-affective ([place]) 02/20/2020  ? Metabolic acidosis, normal anion gap (NAG) 02/20/2020  ? Altered mental status   ? Hypocalcemia   ? AKI (acute kidney injury) ([place]) 02/18/2020  ? Pelvic mass in female 01/02/2020  ? Genital herpes 11/25/2019  ? Stage 2 skin ulcer of sacral region ([place]) 10/28/2019  ? ARF (acute renal failure) ([place]) 10/27/2019  ? Family discord 02/04/2019  ? Aggressive behavior   ? PTSD (post-traumatic stress disorder) 05/27/2018  ? Schizoaffective disorder, bipolar type ([place]) 04/05/2018  ? Closed displaced fracture of fifth metacarpal bone 03/21/2018  ? Difficulty with speech 01/24/2018  ? Encephalopathy 11/21/2017  ? Drug abuse ([place])  11/21/2017  ? Frequent falls 10/11/2017  ? Binge eating disorder   ? Dependence on continuous supplemental oxygen 05/14/2017  ? Gout 04/11/2017  ? Hypomagnesemia   ? CKD (chronic kidney disease) stage 3, GFR 30-59 ml/min (HCC) 12/15/2016  ? Carotid artery stenosis   ? Hypokalemia   ? Osteoarthritis 10/26/2016  ? Anoxic brain injury ([place]) 09/08/2016  ? Overactive bladder 06/07/2016  ? QT prolongation   ? OSA and COPD overlap syndrome ([place])   ? Arthritis   ? Essential hypertension 03/22/2016  ? Cocaine use disorder, severe, in sustained remission ([place]) 12/17/2015  ? History of drug abuse in remission ([place]) 11/28/2015  ? Hyponatremia 11/25/2015  ? Chronic diastolic congestive heart failure ([place])   ? Chronic respiratory failure with hypoxia ([place]) 06/22/2015  ? Tobacco use disorder 07/22/2014  ? COPD (chronic obstructive pulmonary disease) ([place]) 07/08/2014  ? Seizure ([place]) 01/04/2013  ? Chronic pain syndrome 06/18/2012  ? Dyslipidemia 04/24/2011  ? Anemia 04/24/2011  ? Diabetic neuropathy ([place]) 04/24/2011  ? Morbid obesity ([place]) 10/18/2010  ? Type 2 diabetes mellitus ([place]) 10/18/2010  ? ? ?Orientation RESPIRATION BLADDER Height & Weight   ?  ?Self, Time, Situation, Place ? Normal Incontinent Weight:   ?Height:     ?BEHAVIORAL SYMPTOMS/MOOD NEUROLOGICAL BOWEL NUTRITION STATUS  ?    Incontinent Diet (Regular)  ?AMBULATORY STATUS COMMUNICATION OF NEEDS Skin   ?Extensive Assist Verbally Normal ?  ?  ?  ?    ?     ?     ? ? ?Personal Care Assistance Level  of Assistance  ?Bathing, Feeding, Dressing Bathing Assistance: Limited assistance ?Feeding assistance: Independent ?Dressing Assistance: Limited assistance ?   ? ?Functional Limitations Info  ?Sight, Hearing, Speech Sight Info: Impaired (Wears glasses) ?Hearing Info: Adequate ?Speech Info: Adequate  ? ? ?SPECIAL CARE FACTORS FREQUENCY  ?PT (By licensed PT)   ?  ?PT Frequency: 5X weekly ?OT Frequency: 5x weekly ?  ?  ?  ?   ? ? ?Contractures Contractures Info: Not present   ? ? ?Additional Factors Info  ?Code Status, Allergies Code Status Info: Full ?Allergies Info: Hydrocodone,Hydroxyzine, Latuda,Magnesium-containing Compounds,Prednisone,Tramadol,Trazadone, Topamax,Sulfa Antibiotics,Codeine, Tape ?  ?  ?  ?   ? ?Current Medications (03/29/2021):  This is the current hospital active medication list ?Current Facility-Administered Medications  ?Medication Dose Route Frequency Provider Last Rate Last Admin  ? fentaNYL (SUBLIMAZE) injection 50 mcg  50 mcg Intravenous Once Gareth Morgan, MD      ? ?Current Outpatient Medications  ?Medication Sig Dispense Refill  ? albuterol (PROVENTIL) (2.5 MG/3ML) 0.083% nebulizer solution Take 3 mLs (2.5 mg total) by nebulization every 6 (six) hours as needed for wheezing or shortness of breath. 360 mL 5  ? albuterol (VENTOLIN HFA) 108 (90 Base) MCG/ACT inhaler Inhale 2 puffs into the lungs every 6 (six) hours as needed for wheezing or shortness of breath. 8.5 g 2  ? amLODipine (NORVASC) 10 MG tablet TAKE 1 TABLET(10 MG) BY MOUTH DAILY 90 tablet 0  ? asenapine (SAPHRIS) 5 MG SUBL 24 hr tablet Place 2 tablets (10 mg total) under the tongue 2 (two) times daily. 60 tablet 0  ? atorvastatin (LIPITOR) 20 MG tablet Take 1 tablet (20 mg total) by mouth at bedtime. 30 tablet 1  ? colchicine 0.6 MG tablet Take 2 tablets by mouth at the onset of gout flare, may repeat 1 tablet in 1 hour if symptoms persist 15 tablet 0  ? cyanocobalamin 1000 MCG tablet Take 1 tablet (1,000 mcg total) by mouth daily. 90 tablet 1  ? cyclobenzaprine (FLEXERIL) 10 MG tablet Take 1 tablet (10 mg total) by mouth 2 (two) times daily as needed. 180 tablet 0  ? diclofenac (VOLTAREN) 75 MG EC tablet TAKE 1 TABLET (75 MG) BY MOUTH TWICE DAILY AS NEEDED 60 tablet 1  ? diclofenac Sodium (VOLTAREN) 1 % GEL Apply 4 g topically 4 (four) times daily. 100 g 0  ? dicyclomine (BENTYL) 10 MG capsule Take 1 capsule (10 mg total) by mouth 3 (three) times daily before meals. 90 capsule 6  ? escitalopram  (LEXAPRO) 10 MG tablet Take 1 tablet (10 mg total) by mouth daily. 90 tablet 0  ? febuxostat (ULORIC) 40 MG tablet Take 1 tablet (40 mg total) by mouth daily. 30 tablet 1  ? Fluticasone-Umeclidin-Vilant (TRELEGY ELLIPTA) 100-62.5-25 MCG/ACT AEPB Inhale 1 puff into the lungs daily. 60 each 6  ? furosemide (LASIX) 40 MG tablet Take 1 tablet (40 mg total) by mouth daily. 30 tablet 1  ? gabapentin (NEURONTIN) 600 MG tablet Take 1 tablet (600 mg total) by mouth 2 (two) times daily. 60 tablet 1  ? Iron, Ferrous Sulfate, 325 (65 Fe) MG TABS Take 325 mg by mouth daily. 60 tablet 2  ? ketoconazole (NIZORAL) 2 % cream Apply 1 application topically daily. Apply to both feet    ? lidocaine (LIDODERM) 5 % Place 1 patch onto the skin daily. Remove & Discard patch within 12 hours or as directed by MD 30 patch 1  ? montelukast (SINGULAIR) 10 MG tablet Take  1 tablet (10 mg total) by mouth at bedtime. 30 tablet 6  ? Multiple Vitamin (MULTIVITAMIN WITH MINERALS) TABS tablet Take 1 tablet by mouth daily.    ? omeprazole (PRILOSEC) 40 MG capsule TAKE 1 CAPSULE(40 MG) BY MOUTH DAILY (Patient taking differently: Take 40 mg by mouth daily.) 30 capsule 5  ? oxybutynin (DITROPAN) 5 MG tablet TAKE 1 TABLET(5 MG) BY MOUTH TWICE DAILY (Patient taking differently: Take 5 mg by mouth 2 (two) times daily.) 60 tablet 5  ? potassium chloride SA (KLOR-CON M) 20 MEQ tablet Take 1 tablet (20 mEq total) by mouth daily. 30 tablet 0  ? prazosin (MINIPRESS) 2 MG capsule Take 1 capsule (2 mg total) by mouth at bedtime. 30 capsule 0  ? sodium chloride (OCEAN) 0.65 % SOLN nasal spray Place 2 sprays into both nostrils daily.    ? terbinafine (LAMISIL AT) 1 % cream Apply 1 application topically 2 (two) times daily. (Patient taking differently: Apply 1 application topically 3 (three) times daily.) 30 g 1  ? thiamine 100 MG tablet Take 1 tablet (100 mg total) by mouth daily.    ? torsemide (DEMADEX) 20 MG tablet Take 1 tablet (20 mg total) by mouth daily. 6  tablet 0  ? ? ? ?Discharge Medications: ?Please see discharge summary for a list of discharge medications. ? ?Relevant Imaging Results: ? ?Relevant Lab Results: ? ? ?Additional Information ?SS#  [id number]/ Pt has

## 2021-03-29 NOTE — Discharge Planning (Signed)
Licensed Clinical Social Worker is seeking post-discharge placement for this patient at the following level of care: skilled nursing facility    

## 2021-03-29 NOTE — ED Notes (Signed)
While PT was at bedside changing brief she noticed 4 large blood clots in pt brief.  ? ?RN spoke with pt and she stated a few months ago she was told she had a cyst and when she is sitting it hurts. Pt said she noticed blood awhile ago but it had stopped.  ? ?Specimen is at the bedside and EDP notified.  ?

## 2021-03-29 NOTE — Telephone Encounter (Signed)
Patient currently in ED.  possible SNF placement ?

## 2021-03-29 NOTE — Telephone Encounter (Signed)
NO answer LVM 

## 2021-03-29 NOTE — [place] FL2 (Signed)
?[place] MEDICAID FL2 LEVEL OF CARE SCREENING TOOL  ?  ? ?IDENTIFICATION  ?Patient Name: ?Tiffany [name] Birthdate: December 20, 1960 Sex: female Admission Date (Current Location): ?03/28/2021  ?[place] and [place] Number: ? [place] ?  Facility and Address:  ?[facility], [place] [address], [place], [place] [address] ?     Provider Number: ?[id number]  ?Attending Physician Name and Address:  ?Gareth Morgan, MD ? Relative Name and Phone Number:  ?  ?   ?Current Level of Care: ?Hospital Recommended Level of Care: ?[place] Prior Approval Number: ?  ? ?Date Approved/Denied: ?  PASRR Number: ?Pending ? ?Discharge Plan: ?SNF ?  ? ?Current Diagnoses: ?Patient Active Problem List  ? Diagnosis Date Noted  ? Atypical chest pain 01/13/2021  ? GERD (gastroesophageal reflux disease)   ? Neuropathy 11/18/2020  ? Pain in both feet 11/18/2020  ? Localized edema 11/18/2020  ? Encounter for medication management 11/17/2020  ? Protein-calorie malnutrition, severe 05/15/2020  ? Malnutrition of moderate degree 05/13/2020  ? Periumbilical abdominal pain   ? Dysphagia   ? Nausea vomiting and diarrhea   ? Generalized abdominal pain   ? E. coli UTI 02/20/2020  ? Acute metabolic encephalopathy 17/51/0258  ? Psychosis ([place]) 02/20/2020  ? Schizophrenia, schizo-affective ([place]) 02/20/2020  ? Metabolic acidosis, normal anion gap (NAG) 02/20/2020  ? Altered mental status   ? Hypocalcemia   ? AKI (acute kidney injury) ([place]) 02/18/2020  ? Pelvic mass in female 01/02/2020  ? Genital herpes 11/25/2019  ? Stage 2 skin ulcer of sacral region ([place]) 10/28/2019  ? ARF (acute renal failure) ([place]) 10/27/2019  ? Family discord 02/04/2019  ? Aggressive behavior   ? PTSD (post-traumatic stress disorder) 05/27/2018  ? Schizoaffective disorder, bipolar type ([place]) 04/05/2018  ? Closed displaced fracture of fifth metacarpal bone 03/21/2018  ? Difficulty with speech 01/24/2018  ? Encephalopathy 11/21/2017  ? Drug abuse ([place])  11/21/2017  ? Frequent falls 10/11/2017  ? Binge eating disorder   ? Dependence on continuous supplemental oxygen 05/14/2017  ? Gout 04/11/2017  ? Hypomagnesemia   ? CKD (chronic kidney disease) stage 3, GFR 30-59 ml/min (HCC) 12/15/2016  ? Carotid artery stenosis   ? Hypokalemia   ? Osteoarthritis 10/26/2016  ? Anoxic brain injury ([place]) 09/08/2016  ? Overactive bladder 06/07/2016  ? QT prolongation   ? OSA and COPD overlap syndrome ([place])   ? Arthritis   ? Essential hypertension 03/22/2016  ? Cocaine use disorder, severe, in sustained remission ([place]) 12/17/2015  ? History of drug abuse in remission ([place]) 11/28/2015  ? Hyponatremia 11/25/2015  ? Chronic diastolic congestive heart failure ([place])   ? Chronic respiratory failure with hypoxia ([place]) 06/22/2015  ? Tobacco use disorder 07/22/2014  ? COPD (chronic obstructive pulmonary disease) ([place]) 07/08/2014  ? Seizure ([place]) 01/04/2013  ? Chronic pain syndrome 06/18/2012  ? Dyslipidemia 04/24/2011  ? Anemia 04/24/2011  ? Diabetic neuropathy ([place]) 04/24/2011  ? Morbid obesity ([place]) 10/18/2010  ? Type 2 diabetes mellitus ([place]) 10/18/2010  ? ? ?Orientation RESPIRATION BLADDER Height & Weight   ?  ?Self, Time, Situation, Place ? Normal Incontinent Weight:   ?Height:     ?BEHAVIORAL SYMPTOMS/MOOD NEUROLOGICAL BOWEL NUTRITION STATUS  ?    Incontinent Diet (Regular)  ?AMBULATORY STATUS COMMUNICATION OF NEEDS Skin   ?Extensive Assist Verbally Normal ?  ?  ?  ?    ?     ?     ? ? ?Personal Care Assistance Level  of Assistance  ?Bathing, Feeding, Dressing Bathing Assistance: Limited assistance ?Feeding assistance: Independent ?Dressing Assistance: Limited assistance ?   ? ?Functional Limitations Info  ?Sight, Hearing, Speech Sight Info: Impaired (Wears glasses) ?Hearing Info: Adequate ?Speech Info: Adequate  ? ? ?SPECIAL CARE FACTORS FREQUENCY  ?PT (By licensed PT)   ?  ?PT Frequency: 5X weekly ?OT Frequency: 5x weekly ?  ?  ?  ?   ? ? ?Contractures Contractures Info: Not present   ? ? ?Additional Factors Info  ?Code Status, Allergies Code Status Info: Full ?Allergies Info: Hydrocodone,Hydroxyzine, Latuda,Magnesium-containing Compounds,Prednisone,Tramadol,Trazadone, Topamax,Sulfa Antibiotics,Codeine, Tape ?  ?  ?  ?   ? ?Current Medications (03/29/2021):  This is the current hospital active medication list ?No current facility-administered medications for this encounter.  ? ?Current Outpatient Medications  ?Medication Sig Dispense Refill  ? albuterol (PROVENTIL) (2.5 MG/3ML) 0.083% nebulizer solution Take 3 mLs (2.5 mg total) by nebulization every 6 (six) hours as needed for wheezing or shortness of breath. 360 mL 5  ? albuterol (VENTOLIN HFA) 108 (90 Base) MCG/ACT inhaler Inhale 2 puffs into the lungs every 6 (six) hours as needed for wheezing or shortness of breath. 8.5 g 2  ? amLODipine (NORVASC) 10 MG tablet TAKE 1 TABLET(10 MG) BY MOUTH DAILY 90 tablet 0  ? asenapine (SAPHRIS) 5 MG SUBL 24 hr tablet Place 2 tablets (10 mg total) under the tongue 2 (two) times daily. 60 tablet 0  ? atorvastatin (LIPITOR) 20 MG tablet Take 1 tablet (20 mg total) by mouth at bedtime. 30 tablet 1  ? colchicine 0.6 MG tablet Take 2 tablets by mouth at the onset of gout flare, may repeat 1 tablet in 1 hour if symptoms persist 15 tablet 0  ? cyanocobalamin 1000 MCG tablet Take 1 tablet (1,000 mcg total) by mouth daily. 90 tablet 1  ? cyclobenzaprine (FLEXERIL) 10 MG tablet Take 1 tablet (10 mg total) by mouth 2 (two) times daily as needed. 180 tablet 0  ? diclofenac (VOLTAREN) 75 MG EC tablet TAKE 1 TABLET (75 MG) BY MOUTH TWICE DAILY AS NEEDED 60 tablet 1  ? diclofenac Sodium (VOLTAREN) 1 % GEL Apply 4 g topically 4 (four) times daily. 100 g 0  ? dicyclomine (BENTYL) 10 MG capsule Take 1 capsule (10 mg total) by mouth 3 (three) times daily before meals. 90 capsule 6  ? escitalopram (LEXAPRO) 10 MG tablet Take 1 tablet (10 mg total) by mouth daily. 90 tablet 0  ? febuxostat (ULORIC) 40 MG tablet Take 1 tablet  (40 mg total) by mouth daily. 30 tablet 1  ? Fluticasone-Umeclidin-Vilant (TRELEGY ELLIPTA) 100-62.5-25 MCG/ACT AEPB Inhale 1 puff into the lungs daily. 60 each 6  ? furosemide (LASIX) 40 MG tablet Take 1 tablet (40 mg total) by mouth daily. 30 tablet 1  ? gabapentin (NEURONTIN) 600 MG tablet Take 1 tablet (600 mg total) by mouth 2 (two) times daily. 60 tablet 1  ? Iron, Ferrous Sulfate, 325 (65 Fe) MG TABS Take 325 mg by mouth daily. 60 tablet 2  ? ketoconazole (NIZORAL) 2 % cream Apply 1 application topically daily. Apply to both feet    ? lidocaine (LIDODERM) 5 % Place 1 patch onto the skin daily. Remove & Discard patch within 12 hours or as directed by MD 30 patch 1  ? montelukast (SINGULAIR) 10 MG tablet Take 1 tablet (10 mg total) by mouth at bedtime. 30 tablet 6  ? Multiple Vitamin (MULTIVITAMIN WITH MINERALS) TABS tablet Take 1 tablet by  mouth daily.    ? omeprazole (PRILOSEC) 40 MG capsule TAKE 1 CAPSULE(40 MG) BY MOUTH DAILY (Patient taking differently: Take 40 mg by mouth daily.) 30 capsule 5  ? oxybutynin (DITROPAN) 5 MG tablet TAKE 1 TABLET(5 MG) BY MOUTH TWICE DAILY (Patient taking differently: Take 5 mg by mouth 2 (two) times daily.) 60 tablet 5  ? potassium chloride SA (KLOR-CON M) 20 MEQ tablet Take 1 tablet (20 mEq total) by mouth daily. 30 tablet 0  ? prazosin (MINIPRESS) 2 MG capsule Take 1 capsule (2 mg total) by mouth at bedtime. 30 capsule 0  ? sodium chloride (OCEAN) 0.65 % SOLN nasal spray Place 2 sprays into both nostrils daily.    ? terbinafine (LAMISIL AT) 1 % cream Apply 1 application topically 2 (two) times daily. (Patient taking differently: Apply 1 application topically 3 (three) times daily.) 30 g 1  ? thiamine 100 MG tablet Take 1 tablet (100 mg total) by mouth daily.    ? torsemide (DEMADEX) 20 MG tablet Take 1 tablet (20 mg total) by mouth daily. 6 tablet 0  ? ? ? ?Discharge Medications: ?Please see discharge summary for a list of discharge medications. ? ?Relevant Imaging  Results: ? ?Relevant Lab Results: ? ? ?Additional Information ?SS#  [id number]/ Pt has had 2 Covid shots/ Ht & Wt 5'2" 165lbs ? ?Raina Mina, LCSWA ? ? ? ? ?

## 2021-03-29 NOTE — Progress Notes (Signed)
ED TOC team was in process of SNF placement when Pt was found to need higher level of care. ?Pt has bed offers from  and [facility] for SNF.  ?Pt traditionally requires E level PASRR. All requested documents uploaded to [facility] Must website to obtain PASRR. ? ?

## 2021-03-29 NOTE — ED Notes (Signed)
Provided pt a pillow. RN attempted to wake pt up but she is still sleeping.  ?

## 2021-03-29 NOTE — Evaluation (Signed)
Physical Therapy Evaluation ?Patient Details ?Name: Tiffany [name] ?MRN: [id number] ?DOB: [birth year] ?Today's Date: 03/29/2021 ? ?History of Present Illness ? Pt is a 61 yo F presenting to the ED 03/28/21 with bilateral LE swelling, pain, and difficulty walking. Pt has had multiple ER visits for LE swelling. PMH including CHF, CKD, HTN, HLD, DM2, COPD, GERD, anxiety, depression, PTSD, bipolar, drug abuse (cocaine).  ?Clinical Impression ? Patient presented to the ED on 03/28/21 with bilateral LE swelling, pain, and difficulty walking. Pt's impairments include decreased range of motion, strength, power, coordination, and balance of LE with functional mobility. These impairments are limiting her ability to safely and independently transfer, ambulate, and navigate stairs. Patient requires mod to max Ax2 with all functional mobility. Pt did require redirection to tasks at hand throughout session. Pt had significant blood and clots in her brief. RN notified. SPT recommending SNF upon D/C due to mobility deficits. PT will continue to follow acutely to maximize pt's independence with functional mobility. ?   ?   ? ?Recommendations for follow up therapy are one component of a multi-disciplinary discharge planning process, led by the attending physician.  Recommendations may be updated based on patient status, additional functional criteria and insurance authorization. ? ?Follow Up Recommendations Skilled nursing-short term rehab (<3 hours/day) ? ?  ?Assistance Recommended at Discharge Frequent or constant Supervision/Assistance  ?Patient can return home with the following ? Two people to help with walking and/or transfers;A lot of help with bathing/dressing/bathroom;Assistance with cooking/housework;Assistance with feeding;Direct supervision/assist for medications management;Direct supervision/assist for financial management;Assist for transportation;Help with stairs or ramp for entrance ? ?  ?Equipment Recommendations BSC/3in1   ?Recommendations for Other Services ? OT consult  ?  ?Functional Status Assessment Patient has had a recent decline in their functional status and demonstrates the ability to make significant improvements in function in a reasonable and predictable amount of time.  ? ?  ?Precautions / Restrictions Precautions ?Precautions: Fall ?Restrictions ?Weight Bearing Restrictions: No  ? ?  ? ?Mobility ? Bed Mobility ?Overal bed mobility: Needs Assistance ?Bed Mobility: Supine to Sit, Sit to Supine, Rolling ?Rolling: Max assist, +2 for physical assistance, +2 for safety/equipment ?  ?Supine to sit: Mod assist, HOB elevated ?Sit to supine: Max assist, +2 for physical assistance, +2 for safety/equipment ?  ?General bed mobility comments: pt required mod A to sit on EOB. Pt initially leaning posteriorly but able to correct. Pt needed cues to open her eyes at EOB. No dizziness reported. pt required max A x2 to lay pt supine and during rolling with brief change. Pt cyring out in pain during brief change due to leg pain. ?  ? ?Transfers ?Overall transfer level: Needs assistance ?Equipment used: 2 person hand held assist ?Transfers: Sit to/from Stand ?Sit to Stand: Mod assist, +2 physical assistance, +2 safety/equipment, From elevated surface ?  ?  ?  ?  ?  ?General transfer comment: pt required mod Ax2 for transfer for lifting and steadying. Pt began whimpering about pain in her legs. Further mobility deferred for safety and pain modulation. ?  ? ?Ambulation/Gait ?  ?  ?  ?  ?  ?  ?  ?  ? ?Stairs ?  ?  ?  ?  ?  ? ?Wheelchair Mobility ?  ? ?Modified Rankin (Stroke Patients Only) ?  ? ?  ? ?Balance Overall balance assessment: Needs assistance ?Sitting-balance support: Bilateral upper extremity supported, Feet unsupported ?Sitting balance-Leahy Scale: Poor ?Sitting balance - Comments: pt required min guard  in sitting for steadying with BUE support ?  ?Standing balance support: Bilateral upper extremity supported, During functional  activity ?Standing balance-Leahy Scale: Poor ?Standing balance comment: pt required mod Ax2 for standing at EOB with BUE supported. ?  ?  ?  ?  ?  ?  ?  ?  ?  ?  ?  ?   ? ? ? ?Pertinent Vitals/Pain Pain Assessment ?Pain Assessment: Faces ?Faces Pain Scale: Hurts whole lot ?Pain Location: both legs ?Pain Descriptors / Indicators: Discomfort, Grimacing, Crying, Numbness ?Pain Intervention(s): Limited activity within patient's tolerance, Monitored during session  ? ? ?Home Living Family/patient expects to be discharged to:: Private residence ?Living Arrangements: Alone ?Available Help at Discharge: Personal care attendant;Family;Available PRN/intermittently (pt reports her mother, cousins and aide help her throughout the day.) ?Type of Home: Apartment ?Home Access: Level entry ?  ?  ?  ?Home Layout: One level ?Home Equipment: Rollator (4 wheels) ?Additional Comments: pt reports PCA comes M-F at 4pm; unsure when her aide leaves. Pt reports the aide gives her sponge baths and puts socks on her feet  ?  ?Prior Function Prior Level of Function : Patient poor historian/Family not available ?  ?  ?  ?  ?  ?  ?Mobility Comments: pt reports she uses a rollator to ambulate ?ADLs Comments: pt reports aide assists with ADLsv such as bathing ?  ? ? ?Hand Dominance  ? Dominant Hand: Right ? ?  ?Extremity/Trunk Assessment  ? Upper Extremity Assessment ?Upper Extremity Assessment: Generalized weakness ?  ? ?Lower Extremity Assessment ?Lower Extremity Assessment: Generalized weakness;RLE deficits/detail ?RLE Deficits / Details: increased circumference noted in RLE calf and thigh compared to the LLE. Pt reports sensation changes on the R leg with brief sensation screen. ?RLE: Unable to fully assess due to pain ?RLE Sensation: decreased light touch ?RLE Coordination: decreased gross motor;decreased fine motor ?  ? ?Cervical / Trunk Assessment ?Cervical / Trunk Assessment: Kyphotic  ?Communication  ? Communication: No difficulties   ?Cognition Arousal/Alertness: Awake/alert (sleeping upon arrival but easily woken) ?Behavior During Therapy: Anxious ?Overall Cognitive Status: History of cognitive impairments - at baseline ?  ?  ?  ?  ?  ?  ?  ?  ?  ?  ?  ?  ?  ?  ?  ?  ?General Comments: pt became near end of session about the cyst she reports she has. pt was anxious throughout and required to be redirected to tasks at hand ?  ?  ? ?  ?General Comments General comments (skin integrity, edema, etc.): significant bleeding and large clots found in brief during brief change. RN notified immediately. Pt reports she bleeds often and also that it is from a cyst. ? ?  ?Exercises    ? ?Assessment/Plan  ?  ?PT Assessment Patient needs continued PT services  ?PT Problem List Decreased strength;Decreased range of motion;Decreased activity tolerance;Decreased balance;Decreased mobility;Decreased coordination;Decreased cognition;Decreased knowledge of use of DME;Decreased safety awareness;Pain ? ?   ?  ?PT Treatment Interventions DME instruction;Gait training;Functional mobility training;Therapeutic activities;Therapeutic exercise;Balance training;Neuromuscular re-education;Cognitive remediation;Patient/family education   ? ?PT Goals (Current goals can be found in the Care Plan section)  ?Acute Rehab PT Goals ?PT Goal Formulation: Patient unable to participate in goal setting ?Time For Goal Achievement: 04/12/21 ?Potential to Achieve Goals: Good ? ?  ?Frequency Min 2X/week ?  ? ? ?Co-evaluation   ?  ?  ?  ?  ? ? ?  ?AM-PAC PT "6 Clicks" Mobility  ?  Outcome Measure Help needed turning from your back to your side while in a flat bed without using bedrails?: Total ?Help needed moving from lying on your back to sitting on the side of a flat bed without using bedrails?: A Lot ?Help needed moving to and from a bed to a chair (including a wheelchair)?: Total ?Help needed standing up from a chair using your arms (e.g., wheelchair or bedside chair)?: Total ?Help needed  to walk in hospital room?: Total ?Help needed climbing 3-5 steps with a railing? : Total ?6 Click Score: 7 ? ?  ?End of Session Equipment Utilized During Treatment: Gait belt ?Activity Tolerance: Patient lim

## 2021-03-29 NOTE — ED Provider Notes (Signed)
?  Physical Exam  ?BP (!) 140/96 (BP Location: Right Arm)   Pulse 82   Temp 98.7 ?F (37.1 ?C) (Oral)   Resp 18   SpO2 97%  ? ?Physical Exam ? ?Procedures  ?Procedures ? ?ED Course / MDM  ?  ?Medical Decision Making ?Amount and/or Complexity of Data Reviewed ?Labs: ordered. ?Radiology: ordered. ? ?Risk ?Prescription drug management. ?Decision regarding hospitalization. ? ? ?Pt had been awaiting placement due to lower extremity swelling and generalized weakness. ? ?After working with PT today then noted blood on her leg, then when nursing checked brief it had several large blood clots and blood.  She also reports abdominal exam which is new on my exam.  Denies hx of significant GI bleeding requiring hospitalization. ? ?Ordered labs to be repeated, CT abdomen pelvis given abdominal pain.  Likely will need observation admission given concern for lower GI bleed.  Signed out to Dr. Ashok Cordia, she then had another episode of dark maroon blood per nursing.  ? ? ? ? ?  ?Gareth Morgan, MD ?03/29/21 2158 ? ?

## 2021-03-29 NOTE — ED Notes (Signed)
Vitals taken, pt provide a bag lunch with a ginger ale  ?

## 2021-03-29 NOTE — ED Notes (Signed)
RN and NT changed pt linens and brief. Upon changing pt brief RN noticed brief was saturated with bright red blood that had bled through linens as well. Pt had several clots throughout brief. The blood is coming from the pt rectum.  ? ?EDP notified. ?

## 2021-03-29 NOTE — ED Provider Notes (Signed)
Pt in ED with general weakness and bil leg swelling. Since arrival to ED has had several episodes dark blood per rectum. Pt noted to have hx diverticula of colon on prior imaging. Today's ct without new/acute process - has gallstones and large left adnexal mass/cyst.  ? ?On rectal, very dark maroon blood. Recurrent episodes bleeding. Hgb decreased ~ 2 gm in past two weeks. ? ?Medicine service consulted for admission. ? ?GI consulted.  ? ? ?  ?Lajean Saver, MD ?03/29/21 1732 ? ?

## 2021-03-29 NOTE — ED Notes (Signed)
Pt is up talking on phone.  ?

## 2021-03-29 NOTE — ED Notes (Signed)
Confirmed with 5N RN that they are ready for this pt  ?

## 2021-03-29 NOTE — Telephone Encounter (Signed)
Are they requesting an FL2 form for patient. ?

## 2021-03-30 DIAGNOSIS — R933 Abnormal findings on diagnostic imaging of other parts of digestive tract: Secondary | ICD-10-CM | POA: Diagnosis not present

## 2021-03-30 DIAGNOSIS — F419 Anxiety disorder, unspecified: Secondary | ICD-10-CM | POA: Diagnosis present

## 2021-03-30 DIAGNOSIS — E46 Unspecified protein-calorie malnutrition: Secondary | ICD-10-CM | POA: Diagnosis not present

## 2021-03-30 DIAGNOSIS — F32A Depression, unspecified: Secondary | ICD-10-CM | POA: Diagnosis present

## 2021-03-30 DIAGNOSIS — I110 Hypertensive heart disease with heart failure: Secondary | ICD-10-CM | POA: Diagnosis not present

## 2021-03-30 DIAGNOSIS — J9612 Chronic respiratory failure with hypercapnia: Secondary | ICD-10-CM | POA: Diagnosis present

## 2021-03-30 DIAGNOSIS — F4312 Post-traumatic stress disorder, chronic: Secondary | ICD-10-CM | POA: Diagnosis present

## 2021-03-30 DIAGNOSIS — E876 Hypokalemia: Secondary | ICD-10-CM | POA: Diagnosis not present

## 2021-03-30 DIAGNOSIS — J439 Emphysema, unspecified: Secondary | ICD-10-CM | POA: Diagnosis present

## 2021-03-30 DIAGNOSIS — K219 Gastro-esophageal reflux disease without esophagitis: Secondary | ICD-10-CM | POA: Diagnosis not present

## 2021-03-30 DIAGNOSIS — N1832 Chronic kidney disease, stage 3b: Secondary | ICD-10-CM | POA: Diagnosis not present

## 2021-03-30 DIAGNOSIS — F1421 Cocaine dependence, in remission: Secondary | ICD-10-CM | POA: Diagnosis present

## 2021-03-30 DIAGNOSIS — E669 Obesity, unspecified: Secondary | ICD-10-CM | POA: Diagnosis present

## 2021-03-30 DIAGNOSIS — J449 Chronic obstructive pulmonary disease, unspecified: Secondary | ICD-10-CM | POA: Diagnosis not present

## 2021-03-30 DIAGNOSIS — K625 Hemorrhage of anus and rectum: Secondary | ICD-10-CM | POA: Diagnosis not present

## 2021-03-30 DIAGNOSIS — Z743 Need for continuous supervision: Secondary | ICD-10-CM | POA: Diagnosis not present

## 2021-03-30 DIAGNOSIS — M109 Gout, unspecified: Secondary | ICD-10-CM | POA: Diagnosis not present

## 2021-03-30 DIAGNOSIS — G894 Chronic pain syndrome: Secondary | ICD-10-CM | POA: Diagnosis not present

## 2021-03-30 DIAGNOSIS — J302 Other seasonal allergic rhinitis: Secondary | ICD-10-CM | POA: Diagnosis present

## 2021-03-30 DIAGNOSIS — K922 Gastrointestinal hemorrhage, unspecified: Secondary | ICD-10-CM | POA: Diagnosis not present

## 2021-03-30 DIAGNOSIS — I5032 Chronic diastolic (congestive) heart failure: Secondary | ICD-10-CM | POA: Diagnosis not present

## 2021-03-30 DIAGNOSIS — R770 Abnormality of albumin: Secondary | ICD-10-CM | POA: Diagnosis not present

## 2021-03-30 DIAGNOSIS — I10 Essential (primary) hypertension: Secondary | ICD-10-CM | POA: Diagnosis not present

## 2021-03-30 DIAGNOSIS — D631 Anemia in chronic kidney disease: Secondary | ICD-10-CM | POA: Diagnosis present

## 2021-03-30 DIAGNOSIS — E1140 Type 2 diabetes mellitus with diabetic neuropathy, unspecified: Secondary | ICD-10-CM | POA: Diagnosis not present

## 2021-03-30 DIAGNOSIS — I130 Hypertensive heart and chronic kidney disease with heart failure and stage 1 through stage 4 chronic kidney disease, or unspecified chronic kidney disease: Secondary | ICD-10-CM | POA: Diagnosis present

## 2021-03-30 DIAGNOSIS — R296 Repeated falls: Secondary | ICD-10-CM | POA: Diagnosis present

## 2021-03-30 DIAGNOSIS — I509 Heart failure, unspecified: Secondary | ICD-10-CM | POA: Diagnosis not present

## 2021-03-30 DIAGNOSIS — K8020 Calculus of gallbladder without cholecystitis without obstruction: Secondary | ICD-10-CM | POA: Diagnosis not present

## 2021-03-30 DIAGNOSIS — K5731 Diverticulosis of large intestine without perforation or abscess with bleeding: Secondary | ICD-10-CM | POA: Diagnosis not present

## 2021-03-30 DIAGNOSIS — J9611 Chronic respiratory failure with hypoxia: Secondary | ICD-10-CM | POA: Diagnosis present

## 2021-03-30 DIAGNOSIS — E785 Hyperlipidemia, unspecified: Secondary | ICD-10-CM | POA: Diagnosis not present

## 2021-03-30 DIAGNOSIS — E1122 Type 2 diabetes mellitus with diabetic chronic kidney disease: Secondary | ICD-10-CM | POA: Diagnosis present

## 2021-03-30 DIAGNOSIS — D509 Iron deficiency anemia, unspecified: Secondary | ICD-10-CM | POA: Diagnosis not present

## 2021-03-30 DIAGNOSIS — Z1159 Encounter for screening for other viral diseases: Secondary | ICD-10-CM | POA: Diagnosis not present

## 2021-03-30 DIAGNOSIS — D62 Acute posthemorrhagic anemia: Secondary | ICD-10-CM | POA: Diagnosis not present

## 2021-03-30 DIAGNOSIS — I252 Old myocardial infarction: Secondary | ICD-10-CM | POA: Diagnosis not present

## 2021-03-30 DIAGNOSIS — G931 Anoxic brain damage, not elsewhere classified: Secondary | ICD-10-CM | POA: Diagnosis not present

## 2021-03-30 DIAGNOSIS — E1169 Type 2 diabetes mellitus with other specified complication: Secondary | ICD-10-CM | POA: Diagnosis not present

## 2021-03-30 DIAGNOSIS — K5730 Diverticulosis of large intestine without perforation or abscess without bleeding: Secondary | ICD-10-CM | POA: Diagnosis not present

## 2021-03-30 DIAGNOSIS — M1990 Unspecified osteoarthritis, unspecified site: Secondary | ICD-10-CM | POA: Diagnosis present

## 2021-03-30 DIAGNOSIS — R404 Transient alteration of awareness: Secondary | ICD-10-CM | POA: Diagnosis not present

## 2021-03-30 DIAGNOSIS — F250 Schizoaffective disorder, bipolar type: Secondary | ICD-10-CM | POA: Diagnosis present

## 2021-03-30 DIAGNOSIS — K921 Melena: Secondary | ICD-10-CM | POA: Diagnosis not present

## 2021-03-30 DIAGNOSIS — I6529 Occlusion and stenosis of unspecified carotid artery: Secondary | ICD-10-CM | POA: Diagnosis not present

## 2021-03-30 DIAGNOSIS — Z7401 Bed confinement status: Secondary | ICD-10-CM | POA: Diagnosis not present

## 2021-03-30 HISTORY — DX: Gastrointestinal hemorrhage, unspecified: K92.2

## 2021-03-30 LAB — HEMOGLOBIN AND HEMATOCRIT, BLOOD
HCT: 24.1 % — ABNORMAL LOW (ref 36.0–46.0)
Hemoglobin: 7.7 g/dL — ABNORMAL LOW (ref 12.0–15.0)

## 2021-03-30 LAB — CBC
HCT: 22.9 % — ABNORMAL LOW (ref 36.0–46.0)
HCT: 22.3 % — ABNORMAL LOW (ref 36.0–46.0)
Hemoglobin: 7.2 g/dL — ABNORMAL LOW (ref 12.0–15.0)
Hemoglobin: 7.1 g/dL — ABNORMAL LOW (ref 12.0–15.0)
MCH: 28.1 pg (ref 26.0–34.0)
MCH: 27.3 pg (ref 26.0–34.0)
MCHC: 31.4 g/dL (ref 30.0–36.0)
MCHC: 31.8 g/dL (ref 30.0–36.0)
MCV: 89.5 fL (ref 80.0–100.0)
MCV: 85.8 fL (ref 80.0–100.0)
Platelets: 290 10*3/uL (ref 150–400)
Platelets: 199 10*3/uL (ref 150–400)
RBC: 2.56 MIL/uL — ABNORMAL LOW (ref 3.87–5.11)
RBC: 2.6 MIL/uL — ABNORMAL LOW (ref 3.87–5.11)
RDW: 15.6 % — ABNORMAL HIGH (ref 11.5–15.5)
RDW: 17.4 % — ABNORMAL HIGH (ref 11.5–15.5)
WBC: 6.6 10*3/uL (ref 4.0–10.5)
WBC: 6 10*3/uL (ref 4.0–10.5)
nRBC: 0 % (ref 0.0–0.2)
nRBC: 0 % (ref 0.0–0.2)

## 2021-03-30 LAB — URINALYSIS, ROUTINE W REFLEX MICROSCOPIC
Bilirubin Urine: NEGATIVE
Glucose, UA: NEGATIVE mg/dL
Ketones, ur: NEGATIVE mg/dL
Nitrite: NEGATIVE
Protein, ur: 30 mg/dL — AB
Specific Gravity, Urine: 1.011 (ref 1.005–1.030)
pH: 5 (ref 5.0–8.0)

## 2021-03-30 LAB — BASIC METABOLIC PANEL
Anion gap: 7 (ref 5–15)
BUN: 35 mg/dL — ABNORMAL HIGH (ref 8–23)
CO2: 27 mmol/L (ref 22–32)
Calcium: 8.6 mg/dL — ABNORMAL LOW (ref 8.9–10.3)
Chloride: 103 mmol/L (ref 98–111)
Creatinine, Ser: 1.64 mg/dL — ABNORMAL HIGH (ref 0.44–1.00)
GFR, Estimated: 35 mL/min — ABNORMAL LOW (ref >60)
Glucose, Bld: 87 mg/dL (ref 70–99)
Potassium: 4 mmol/L (ref 3.5–5.1)
Sodium: 137 mmol/L (ref 135–145)

## 2021-03-30 LAB — PREPARE RBC (CROSSMATCH)

## 2021-03-30 MED ORDER — SODIUM CHLORIDE 0.9% IV SOLUTION
Freq: Once | INTRAVENOUS | Status: DC
Start: 2021-03-31 — End: 2021-04-02

## 2021-03-30 MED ORDER — PRAZOSIN HCL 2 MG PO CAPS
2.0000 mg | ORAL_CAPSULE | Freq: Every day | ORAL | Status: DC
  Administered 2021-03-30 – 2021-04-06 (×8): 2 mg via ORAL
  Filled 2021-03-30 (×8): qty 1

## 2021-03-30 MED ORDER — GABAPENTIN 300 MG PO CAPS
600.0000 mg | ORAL_CAPSULE | Freq: Two times a day (BID) | ORAL | Status: DC
  Administered 2021-03-30 – 2021-04-06 (×16): 600 mg via ORAL
  Filled 2021-03-30 (×18): qty 2

## 2021-03-30 MED ORDER — FENTANYL CITRATE PF 50 MCG/ML IJ SOSY
25.0000 ug | PREFILLED_SYRINGE | Freq: Four times a day (QID) | INTRAMUSCULAR | Status: DC | PRN
  Administered 2021-03-30 – 2021-04-02 (×9): 25 ug via INTRAVENOUS
  Filled 2021-03-30 (×9): qty 1

## 2021-03-30 MED ORDER — PEG 3350-KCL-NA BICARB-NACL 420 G PO SOLR
4000.0000 mL | Freq: Once | ORAL | Status: AC
  Administered 2021-03-30: 4000 mL via ORAL
  Filled 2021-03-30: qty 4000

## 2021-03-30 MED ORDER — DULOXETINE HCL 30 MG PO CPEP
30.0000 mg | ORAL_CAPSULE | Freq: Every day | ORAL | Status: DC
  Administered 2021-03-30 – 2021-04-06 (×7): 30 mg via ORAL
  Filled 2021-03-30 (×8): qty 1

## 2021-03-30 MED ORDER — ATORVASTATIN CALCIUM 10 MG PO TABS
20.0000 mg | ORAL_TABLET | Freq: Every day | ORAL | Status: DC
  Administered 2021-03-30 – 2021-04-06 (×7): 20 mg via ORAL
  Filled 2021-03-30 (×8): qty 2

## 2021-03-30 MED ORDER — FERROUS SULFATE 325 (65 FE) MG PO TABS
325.0000 mg | ORAL_TABLET | Freq: Every day | ORAL | Status: DC
  Administered 2021-03-30 – 2021-04-06 (×7): 325 mg via ORAL
  Filled 2021-03-30 (×8): qty 1

## 2021-03-30 MED ORDER — SODIUM CHLORIDE 0.9 % IV SOLN: INTRAVENOUS | Status: DC

## 2021-03-30 MED ORDER — OXYBUTYNIN CHLORIDE 5 MG PO TABS
5.0000 mg | ORAL_TABLET | Freq: Two times a day (BID) | ORAL | Status: DC
Start: 2021-03-30 — End: 2021-04-07
  Administered 2021-03-30 – 2021-04-06 (×14): 5 mg via ORAL
  Filled 2021-03-30 (×15): qty 1

## 2021-03-30 MED ORDER — SODIUM CHLORIDE 0.9% IV SOLUTION: Freq: Once | INTRAVENOUS | Status: AC

## 2021-03-30 MED ORDER — ACETAMINOPHEN 325 MG PO TABS
650.0000 mg | ORAL_TABLET | Freq: Four times a day (QID) | ORAL | Status: DC | PRN
  Administered 2021-03-30 – 2021-04-05 (×9): 650 mg via ORAL
  Filled 2021-03-30 (×10): qty 2

## 2021-03-30 NOTE — Progress Notes (Signed)
? ? ?  OVERNIGHT PROGRESS REPORT ? ?Notified of patient having blood in stool with prep as previously noted. ?CBC checked in protocol after previous unit yielding a decline as noted previously. ?(1) One unit PRBC ordered as prior.  ? ?Gershon Cull MSNA MSN ACNPC-AG ?Acute Care Nurse Practitioner ?[facility] ?[place] ? ? ? ?

## 2021-03-30 NOTE — Assessment & Plan Note (Signed)
-   Has chronic anemia with baseline around 11 but this has downward trended to 9.5 in the past week.  Will repeat another H&H tonight to follow.  Transfusion threshold of hemoglobin <7. ?

## 2021-03-30 NOTE — Assessment & Plan Note (Addendum)
Has chronic lower extremity pain with increasing weakness.  Continue gabapentin '600MG'$  BID.  ?-PT eval in ED recommended SNF placement. ?

## 2021-03-30 NOTE — Progress Notes (Signed)
?PROGRESS NOTE ? ?Tiffany [name] UKG:[id number] DOB: 09-25-1960 DOA: 03/28/2021 ?PCP: [name], MD ? ? LOS: 0 days  ? ?Brief Narrative / Interim history: ?Tiffany [name] is a 61 y.o. female with medical history significant of Anoxic brain injury s/p PEA arrest in 7628, chronic diastolic heart failure, COPD, hypertension, type 2 diabetes, seizure, PTSD/schizoaffective disorder who hospitalist is being called for rectal bleeding. She had several ED visits for LE pain, was seen by PT and was awaiting SNF placement, when she noted to have several large blood clots on her briefs. ? ?Subjective / 24h Interval events: ?Complains of leg pain, bilateral, burning. No abdominal pain, no nausea/vomiting. Says that she has had blood in her stool in the past. Remembers a colonoscopy few years ago "[place]", but doesn't know the resulta ? ?Assesement and Plan: ?Principal Problem: ?  Acute GI bleeding ?Active Problems: ?  Anemia ?  Diabetic neuropathy ([place]) ?  COPD (chronic obstructive pulmonary disease) ([place]) ?  Essential hypertension ?  Schizophrenia, schizo-affective ([place]) ?  GI bleed ? ? ?Assessment and Plan: ?Principal problem ?Acute GI bleeding, likely lower - Patient initially was boarding in the ED awaiting SNF placement but developed rectal bleeding and noted to have downward trending Hgb. She has documented colonoscopy and EGD for GI bleed in 2021 in outside facility in [place].  Unfortunately we do not have records of the findings. GI consulted, I spoke with Dr Collene Mares this morning. Appreciate input. ? ?Active problems ?Acute blood loss anemia -Has chronic anemia with baseline around 11 but this morning is down to 7.2. Given rate of decline will transfuse a unit of pRBC. Patient agreeable ? ?LE neuropathy ([place]) -Has chronic lower extremity pain with increasing weakness / pain.  Continue gabapentin '600MG'$  BID. Cannot use other pain agents due to multiple significant allergies ? ?Schizophrenia, schizo-affective ([place])  - appears appropriate this morning, good insigh ? ?Essential hypertension -hold home medications with concern for GI blees ? ?COPD (chronic obstructive pulmonary disease) (HCC) -Stable, not in exacerbation ? ?Home medications will be resumed once pharmacy completes reconciliation ? ?Scheduled Meds: ? gabapentin  600 mg Oral BID  ? ?Continuous Infusions: ?PRN Meds:.acetaminophen, fentaNYL (SUBLIMAZE) injection ? ?Diet Orders (From admission, onward)  ? ?  Start     Ordered  ? 03/30/21 0813  Diet clear liquid Room service appropriate? Yes; Fluid consistency: Thin  Diet effective now       ?Question Answer Comment  ?Room service appropriate? Yes   ?Fluid consistency: Thin   ?  ? 03/30/21 0813  ? ?  ?  ? ?  ? ? ?DVT prophylaxis: SCDs Start: 03/29/21 2013 ? ? ?Lab Results  ?Component Value Date  ? PLT 290 03/30/2021  ? ? ?  Code Status: Full Code ? ?Family Communication: no family at bedside  ? ?Status is: Observation ? ?The patient will require care spanning > 2 midnights and should be moved to inpatient because: ABLA, GI bleed ? ? ?Level of care: Progressive ? ?Consultants:  ?GI ? ?Procedures:  ?none ? ?Microbiology  ?none ? ?Antimicrobials: ?none  ? ? ?Objective: ?Vitals:  ? 03/30/21 0325 03/30/21 0815 03/30/21 0911 03/30/21 0926  ?BP: 128/89  140/90 (!) 155/87  ?Pulse: 80  77 82  ?Resp: '14  13 17  '$ ?Temp:  98.6 ?F (37 ?C) 97.9 ?F (36.6 ?C) (!) 97.4 ?F (36.3 ?C)  ?TempSrc:  Oral Oral Oral  ?SpO2: 97%  100% 100%  ? ? ?Intake/Output Summary (Last 24 hours)  at 03/30/2021 1110 ?Last data filed at 03/30/2021 0900 ?Gross per 24 hour  ?Intake 240 ml  ?Output --  ?Net 240 ml  ? ?Wt Readings from Last 3 Encounters:  ?03/26/21 74.8 kg  ?03/19/21 72.6 kg  ?03/18/21 72.6 kg  ? ? ?Examination: ? ?Constitutional: NAD ?Eyes: no scleral icterus ?ENMT: Mucous membranes are moist.  ?Neck: normal, supple ?Respiratory: clear to auscultation bilaterally, no wheezing, no crackles. Normal respiratory effort. No accessory muscle use.   ?Cardiovascular: Regular rate and rhythm, no murmurs / rubs / gallops. No LE edema. Good peripheral pulses ?Abdomen: non distended, no tenderness. Bowel sounds positive.  ?Musculoskeletal: no clubbing / cyanosis.  ?Skin: no rashes ?Neurologic: non focal  ? ? ?Data Reviewed: I have independently reviewed following labs and imaging studies ? ?CBC ?Recent Labs  ?Lab 03/23/21 ?1644 03/28/21 ?1843 03/29/21 ?1402 03/30/21 ?0248  ?WBC 6.2 6.1 4.2 6.6  ?HGB 10.3* 10.6* 9.5* 7.2*  ?HCT 33.4* 35.0* 31.7* 22.9*  ?PLT 365 405* 387 290  ?MCV 89.5 90.2 90.6 89.5  ?MCH 27.6 27.3 27.1 28.1  ?MCHC 30.8 30.3 30.0 31.4  ?RDW 15.3 15.6* 15.6* 15.6*  ?LYMPHSABS  --  2.0 1.1  --   ?MONOABS  --  0.7 0.4  --   ?EOSABS  --  0.2 0.2  --   ?BASOSABS  --  0.0 0.0  --   ? ? ?Recent Labs  ?Lab 03/23/21 ?1644 03/28/21 ?1843 03/29/21 ?1402 03/30/21 ?0248  ?NA 137 139 141 137  ?K 3.3* 3.9 4.0 4.0  ?CL 105 103 108 103  ?CO2 '22 27 24 27  '$ ?GLUCOSE 87 87 99 87  ?BUN 23 29* 33* 35*  ?CREATININE 1.85* 2.04* 1.76* 1.64*  ?CALCIUM 9.1 9.7 8.9 8.6*  ?AST 35 34 27  --   ?ALT '25 24 19  '$ --   ?ALKPHOS 216* 202* 178*  --   ?BILITOT 0.2* 0.4 0.3  --   ?ALBUMIN 3.5 3.4* 3.1*  --   ?INR  --   --  1.2  --   ? ? ?------------------------------------------------------------------------------------------------------------------ ?No results for input(s): CHOL, HDL, LDLCALC, TRIG, CHOLHDL, LDLDIRECT in the last 72 hours. ? ?Lab Results  ?Component Value Date  ? HGBA1C 5.3 07/23/2020  ? ?------------------------------------------------------------------------------------------------------------------ ?No results for input(s): TSH, T4TOTAL, T3FREE, THYROIDAB in the last 72 hours. ? ?Invalid input(s): FREET3 ? ?Cardiac Enzymes ?No results for input(s): CKMB, TROPONINI, MYOGLOBIN in the last 168 hours. ? ?Invalid input(s): CK ?------------------------------------------------------------------------------------------------------------------ ?   ?Component Value Date/Time  ?  BNP 27.7 03/18/2021 1010  ? ? ?CBG: ?Recent Labs  ?Lab 03/28/21 ?0093  ?GLUCAP 74  ? ? ?No results found for this or any previous visit (from the past 240 hour(s)).  ? ?Radiology Studies: ?CT ABDOMEN PELVIS WO CONTRAST ? ?Result Date: 03/29/2021 ?CLINICAL DATA:  Left lower quadrant abdominal pain. Rectal bleeding. EXAM: CT ABDOMEN AND PELVIS WITHOUT CONTRAST TECHNIQUE: Multidetector CT imaging of the abdomen and pelvis was performed following the standard protocol without IV contrast. RADIATION DOSE REDUCTION: This exam was performed according to the departmental dose-optimization program which includes automated exposure control, adjustment of the mA and/or kV according to patient size and/or use of iterative reconstruction technique. COMPARISON:  07/09/2020 and pelvic ultrasound 07/23/2020 FINDINGS: Lower chest: Lung bases are clear. Hepatobiliary: Gallbladder is mildly distended with high-density stones. Normal appearance of the liver. Pancreas: Unremarkable. No pancreatic ductal dilatation or surrounding inflammatory changes. Spleen: Normal in size without focal abnormality. Adrenals/Urinary Tract: Fullness of the left adrenal gland with  Hounsfield units measuring approximately 8. There could be a small left adrenal adenoma. Right adrenal gland is unremarkable. Both kidneys are small without hydronephrosis. Moderate distention of the urinary bladder. Stomach/Bowel: Large amount of stool in the rectum. Normal appearance of the stomach. Left colon and sigmoid colon are mildly distended and this may be secondary to the large amount of rectal stool. There is no small bowel dilatation. Vascular/Lymphatic: No significant lymphadenopathy in the abdomen or pelvis. Negative for an abdominal aortic aneurysm. Reproductive: Hysterectomy. Again noted is a large low-density structure involving the left adnexa that measures 7.4 x 5.9 x 6.9 cm and measured approximately 7.4 x 7.1 x 7.5 cm on 08/08/2020. Overall, this structure  has minimally changed. This was compatible with a simple cyst on previous ultrasound. No evidence for a right adnexal lesion. Other: Negative for ascites.  Negative for free air. Musculoskeletal: Progressive dis

## 2021-03-30 NOTE — Telephone Encounter (Signed)
Copied from [place] [phone]. Topic: General - Other >> Mar 28, 2021  9:35 AM Tessa Lerner A wrote: Reason for CRM: The patient would like to speak with a member of staff about the completion of previously requested   The patient shares that they're continuing to experience mobility concerns and would like to seek rehab   Please contact further when possible

## 2021-03-30 NOTE — Plan of Care (Signed)
  Problem: Education: Goal: Knowledge of General Education information will improve Description: Including pain rating scale, medication(s)/side effects and non-pharmacologic comfort measures Outcome: Progressing   Problem: Clinical Measurements: Goal: Ability to maintain clinical measurements within normal limits will improve Outcome: Progressing Goal: Will remain free from infection Outcome: Progressing   

## 2021-03-30 NOTE — Assessment & Plan Note (Signed)
-   Patient initially was boarding in the ED awaiting SNF placement but developed rectal bleeding today and noted to have downward trending Hgb ?-She has documented colonoscopy and EGD for GI bleed in 2021 in outside facility in [place].  Unfortunately we do not have records of the findings. ?-GI Dr. Collene Mares consulted by ED physician and will see in the morning ? ?

## 2021-03-30 NOTE — NC FL2 (Signed)
?[place] MEDICAID FL2 LEVEL OF CARE SCREENING TOOL  ?  ? ?IDENTIFICATION  ?Patient Name: ?Tiffany [name] Birthdate: [birth year] Sex: female Admission Date (Current Location): ?03/28/2021  ?[place] and [place] Number: ? [place] ?  Facility and Address:  ?[name] [place] [address], [place], [place] [address] ?     Provider Number: ?[id number]  ?Attending Physician Name and Address:  ?Caren Griffins, MD ? Relative Name and Phone Number:  ?Rondel Baton [phone] ?   ?Current Level of Care: ?Hospital Recommended Level of Care: ?[place] Prior Approval Number: ?  ? ?Date Approved/Denied: ?  PASRR Number: ?[id number] E ? ?Discharge Plan: ?SNF ?  ? ?Current Diagnoses: ?Patient Active Problem List  ? Diagnosis Date Noted  ? GI bleed 03/30/2021  ? Acute GI bleeding 03/29/2021  ? Atypical chest pain 01/13/2021  ? GERD (gastroesophageal reflux disease)   ? Neuropathy 11/18/2020  ? Pain in both feet 11/18/2020  ? Localized edema 11/18/2020  ? Encounter for medication management 11/17/2020  ? Protein-calorie malnutrition, severe 05/15/2020  ? Malnutrition of moderate degree 05/13/2020  ? Periumbilical abdominal pain   ? Dysphagia   ? Nausea vomiting and diarrhea   ? Generalized abdominal pain   ? E. coli UTI 02/20/2020  ? Acute metabolic encephalopathy 44/31/5400  ? Psychosis ([place]) 02/20/2020  ? Schizophrenia, schizo-affective ([place]) 02/20/2020  ? Metabolic acidosis, normal anion gap (NAG) 02/20/2020  ? Altered mental status   ? Hypocalcemia   ? AKI (acute kidney injury) ([place]) 02/18/2020  ? Pelvic mass in female 01/02/2020  ? Genital herpes 11/25/2019  ? Stage 2 skin ulcer of sacral region ([place]) 10/28/2019  ? ARF (acute renal failure) ([place]) 10/27/2019  ? Family discord 02/04/2019  ? Aggressive behavior   ? PTSD (post-traumatic stress disorder) 05/27/2018  ? Schizoaffective disorder, bipolar type ([place]) 04/05/2018  ? Closed displaced fracture of fifth metacarpal bone 03/21/2018  ?  Difficulty with speech 01/24/2018  ? Encephalopathy 11/21/2017  ? Drug abuse ([place]) 11/21/2017  ? Frequent falls 10/11/2017  ? Binge eating disorder   ? Dependence on continuous supplemental oxygen 05/14/2017  ? Gout 04/11/2017  ? Hypomagnesemia   ? CKD (chronic kidney disease) stage 3, GFR 30-59 ml/min (HCC) 12/15/2016  ? Carotid artery stenosis   ? Hypokalemia   ? Osteoarthritis 10/26/2016  ? Anoxic brain injury ([place]) 09/08/2016  ? Overactive bladder 06/07/2016  ? QT prolongation   ? OSA and COPD overlap syndrome ([place])   ? Arthritis   ? Essential hypertension 03/22/2016  ? Cocaine use disorder, severe, in sustained remission ([place]) 12/17/2015  ? History of drug abuse in remission ([place]) 11/28/2015  ? Hyponatremia 11/25/2015  ? Chronic diastolic congestive heart failure ([place])   ? Chronic respiratory failure with hypoxia ([place]) 06/22/2015  ? Tobacco use disorder 07/22/2014  ? COPD (chronic obstructive pulmonary disease) ([place]) 07/08/2014  ? Seizure ([place]) 01/04/2013  ? Chronic pain syndrome 06/18/2012  ? Dyslipidemia 04/24/2011  ? Anemia 04/24/2011  ? Diabetic neuropathy ([place]) 04/24/2011  ? Morbid obesity ([place]) 10/18/2010  ? Type 2 diabetes mellitus ([place]) 10/18/2010  ? ? ?Orientation RESPIRATION BLADDER Height & Weight   ?  ?Self, Time, Situation, Place ? Normal Incontinent Weight:   ?Height:     ?BEHAVIORAL SYMPTOMS/MOOD NEUROLOGICAL BOWEL NUTRITION STATUS  ?    Incontinent Diet (See DC summary)  ?AMBULATORY STATUS COMMUNICATION OF NEEDS Skin   ?Extensive Assist Verbally Normal ?  ?  ?  ?    ?     ?     ? ? ?  Personal Care Assistance Level of Assistance  ?Bathing, Feeding, Dressing Bathing Assistance: Limited assistance ?Feeding assistance: Independent ?Dressing Assistance: Limited assistance ?   ? ?Functional Limitations Info  ?Sight, Hearing, Speech Sight Info: Impaired ?Hearing Info: Adequate ?Speech Info: Adequate  ? ? ?SPECIAL CARE FACTORS FREQUENCY  ?PT (By licensed PT), OT (By licensed OT)   ?  ?PT Frequency: 5x  week ?OT Frequency: 5x week ?  ?  ?  ?   ? ? ?Contractures Contractures Info: Not present  ? ? ?Additional Factors Info  ?Code Status, Allergies Code Status Info: Full ?Allergies Info: Hydrocodone   Hydroxyzine   Latuda (Lurasidone Hcl)   Magnesium-containing Compounds   Prednisone   Tramadol   Codeine   Trazodone   Topamax (Topiramate)   Sulfa Antibiotics   Tape ?  ?  ?  ?   ? ?Current Medications (03/30/2021):  This is the current hospital active medication list ?Current Facility-Administered Medications  ?Medication Dose Route Frequency Provider Last Rate Last Admin  ? acetaminophen (TYLENOL) tablet 650 mg  650 mg Oral Q6H PRN Tu, Ching T, DO   650 mg at 03/30/21 0825  ? fentaNYL (SUBLIMAZE) injection 25 mcg  25 mcg Intravenous Q6H PRN Tu, Ching T, DO   25 mcg at 03/30/21 4332  ? gabapentin (NEURONTIN) capsule 600 mg  600 mg Oral BID Tu, Ching T, DO   600 mg at 03/30/21 0825  ? ? ? ?Discharge Medications: ?Please see discharge summary for a list of discharge medications. ? ?Relevant Imaging Results: ? ?Relevant Lab Results: ? ? ?Additional Information ?SS#  [id number]/ Pt has had 2 Covid shots/ Ht & Wt 5'2" 165lbs ? ?Coralee Pesa, LCSWA ? ? ? ? ?

## 2021-03-30 NOTE — Assessment & Plan Note (Signed)
Controlled.  
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
## 2021-03-30 NOTE — Consult Note (Addendum)
UNASSIGNED PATIENT Reason for Consult: Rectal bleeding. Referring Physician: [facility] hospital[facility].  Tiffany [name] is an 61 y.o. female.  HPI: [name] is a 61 year old black female with multiple medical problems listed below who presented to the emergency room with chronic left extremity pain on Gabapentin with numerous ER visits for this. She presented to the ED on 03/28/2021 for similar symptoms awaiting for bed placement when she was noted to have large clots with blood on her underclothes noted by her nurse.  Denies having any abdominal pain, nausea or vomiting.  She denies any problems constipation. She has regular bowel movements.  Her hemoglobin dropped from 11.1 g/dL 2 weeks ago to 7.2 g/dL today.  She gives a history of having a colonoscopy several years ago but cannot tell me where it was done and which gastroenterologist did her procedure.  On the CT scan done on 03/29/2021 she was noted to have a persistent large left adnexal cyst and large amount of stool in the rectum with slight dilation of sigmoid and rectum left colon due to constipation along with cholelithiasis and mild gallbladder distention; progressive displaced loss and anterolisthesis at L3-L4.  According to Dr. Kathlene [name] notes from 03/29/2021 patient had large amount of dark stools from the rectum and she was noted to have diverticula in the colon on previous imaging.  Past Medical History:  Diagnosis Date   Agitation 11/22/2017   Anoxic brain injury (HCC) 09/08/2016   C. Arrest due to respiratory failure and COPD exacerbation   Anxiety    Arthritis    "all over" (04/10/2016)   Asthma 10/18/2010   Binge eating disorder    Cardiac arrest (HCC) 09/08/2016   PEA   Carotid artery stenosis    1-39% bilateral by dopplers 11/2016   Chronic diastolic (congestive) heart failure (HCC)    Chronic kidney disease, stage 3 (HCC)    Chronic pain syndrome 06/18/2012   Chronic post-traumatic stress disorder (PTSD) 05/27/2018    Chronic respiratory failure with hypoxia and hypercapnia (HCC) 06/22/2015   TRILOGY Vent >AVAPA-ES., Vt target 200-400, Max P 30 , PS max 20 , PS min 6-10 , E Max 6, E Min 4, Rate Auto AVAPS Rate 2 (titrate for pt comfort) , bleed O2 at 5l/m continuous flow .    Closed displaced fracture of fifth metacarpal bone 03/21/2018   Cocaine use disorder, severe, in sustained remission (HCC) 12/17/2015   Complication of anesthesia    decreased bp, decreased heart rate   COPD (chronic obstructive pulmonary disease) (HCC) 07/08/2014   Depression    Diabetic neuropathy (HCC) 04/24/2011   Difficulty with speech 01/24/2018   Disorder of nervous system    Drug abuse (HCC) 11/21/2017   Dyslipidemia 04/24/2011   Elevated troponin 04/28/2012   Emphysema    Encephalopathy 11/21/2017   Essential hypertension 03/22/2016   Fibula fracture 07/10/2016   Frequent falls 10/11/2017   GERD (gastroesophageal reflux disease)    Gout 04/11/2017   Heart attack (HCC) 1980s   History of blood transfusion 1994   "couldn't stop bleeding from my period"   History of drug abuse in remission (HCC) 11/28/2015   Quit in 2017   Hyperlipidemia LDL goal <70    Incontinence    Manic depression (HCC)    Morbid obesity (HCC) 10/18/2010   Obstructive sleep apnea 10/18/2010   On home oxygen therapy    "6L; 24/7" (04/10/2016)   OSA on CPAP    "wear mask sometimes" (04/10/2016)   Paranoid (HCC)    "  sometimes; I'm on RX for it" (04/10/2016)   Prolonged Q-T interval on ECG    Rectal bleeding 12/31/2015   Schizoaffective disorder, bipolar type (HCC) 04/05/2018   Seasonal allergies    Seborrheic keratoses 12/31/2013   Seizures (HCC)    "don't know what kind; last one was ~ 1 yr ago" (04/10/2016)   Sinus bradycardia    Stroke [facility]) 1980s   denies residual on 04/10/2016   Thrush 09/19/2013   Type 2 diabetes mellitus (HCC) 10/18/2010   Past Surgical History:  Procedure Laterality Date   CESAREAN SECTION  1997   HERNIA REPAIR      IR CHOLANGIOGRAM EXISTING TUBE  07/20/2016   IR PERC CHOLECYSTOSTOMY  05/10/2016   IR RADIOLOGIST EVAL & MGMT  06/08/2016   IR RADIOLOGIST EVAL & MGMT  06/29/2016   IR SINUS/FIST TUBE CHK-NON GI  07/12/2016   RIGHT/LEFT HEART CATH AND CORONARY ANGIOGRAPHY N/A 06/19/2017   Procedure: RIGHT/LEFT HEART CATH AND CORONARY ANGIOGRAPHY;  Surgeon: Dolores Patty, MD;  Location: [facility] INVASIVE [facility];  Service: Cardiovascular;  Laterality: N/A;   TIBIA IM NAIL INSERTION Right 07/12/2016   Procedure: INTRAMEDULLARY (IM) NAIL RIGHT TIBIA;  Surgeon: Tarry Kos, MD;  Location: [facility] OR;  Service: Orthopedics;  Laterality: Right;   UMBILICAL HERNIA REPAIR  ~ 1963   "that's why I don't have a belly button"   VAGINAL HYSTERECTOMY     Family History  Problem Relation Age of Onset   Cancer Mother        lung   Depression Mother    Cancer Father        prostate   Depression Sister    Anxiety disorder Sister    Schizophrenia Sister    Bipolar disorder Sister    Depression Sister    Depression Brother    Heart failure Other        cousin   Social History:  reports that she quit smoking about 4 years ago. Her smoking use included cigarettes. She started smoking about 44 years ago. She has a 57.00 pack-year smoking history. She has never used smokeless tobacco. She reports that she does not currently use drugs after having used the following drugs: Cocaine. She reports that she does not drink alcohol.  Allergies:  Allergies  Allergen Reactions   Hydrocodone Shortness Of Breath   Hydroxyzine Anaphylaxis and Shortness Of Breath   Latuda [Lurasidone Hcl] Anaphylaxis   Magnesium-Containing Compounds Anaphylaxis    Tolerated Ensure   Prednisone Anaphylaxis, Swelling and Other (See Comments)    Tongue swelling, lip swelling, throat swelling, per pt    Tramadol Anaphylaxis and Swelling   Codeine Nausea And Vomiting   Trazodone Other (See Comments)    paranoia   Topamax [Topiramate] Other (See Comments)     Increases paranoia   Sulfa Antibiotics Itching   Tape Rash   Medications: I have reviewed the patient's current medications. Prior to Admission:  Medications Prior to Admission  Medication Sig Dispense Refill Last Dose   albuterol (PROVENTIL) (2.5 MG/3ML) 0.083% nebulizer solution Take 3 mLs (2.5 mg total) by nebulization every 6 (six) hours as needed for wheezing or shortness of breath. 360 mL 5 week ago   albuterol (VENTOLIN HFA) 108 (90 Base) MCG/ACT inhaler Inhale 2 puffs into the lungs every 6 (six) hours as needed for wheezing or shortness of breath. 8.5 g 2 03/28/2021   amLODipine (NORVASC) 10 MG tablet TAKE 1 TABLET(10 MG) BY MOUTH DAILY (Patient taking differently: Take  10 mg by mouth every morning.) 90 tablet 0 03/28/2021   Asenapine Maleate 10 MG SUBL Place 10 mg under the tongue 2 (two) times daily.   03/28/2021 at am   atorvastatin (LIPITOR) 20 MG tablet Take 1 tablet (20 mg total) by mouth at bedtime. 30 tablet 1 3/12 or 3/13   colchicine 0.6 MG tablet Take 2 tablets by mouth at the onset of gout flare, may repeat 1 tablet in 1 hour if symptoms persist (Patient taking differently: Take 0.6 mg by mouth every morning.) 15 tablet 0 03/28/2021   diazepam (VALIUM) 5 MG tablet Take 5 mg by mouth every morning.   03/28/2021   diclofenac (VOLTAREN) 75 MG EC tablet TAKE 1 TABLET (75 MG) BY MOUTH TWICE DAILY AS NEEDED (Patient taking differently: 75 mg 2 (two) times daily.) 60 tablet 1 3/12 or 3/13   diclofenac Sodium (VOLTAREN) 1 % GEL Apply 4 g topically 4 (four) times daily. (Patient taking differently: Apply 4 g topically 4 (four) times daily as needed (pain).) 100 g 0 3/12 or 3/13   dicyclomine (BENTYL) 10 MG capsule Take 1 capsule (10 mg total) by mouth 3 (three) times daily before meals. 90 capsule 6 03/28/2021   DULoxetine (CYMBALTA) 30 MG capsule Take 30 mg by mouth daily.   3/12 or 3/13   escitalopram (LEXAPRO) 20 MG tablet Take 20 mg by mouth every morning.   03/28/2021   febuxostat  (ULORIC) 40 MG tablet Take 1 tablet (40 mg total) by mouth daily. 30 tablet 1 03/28/2021   Fluticasone-Umeclidin-Vilant (TRELEGY ELLIPTA) 100-62.5-25 MCG/ACT AEPB Inhale 1 puff into the lungs daily. 60 each 6 03/28/2021   furosemide (LASIX) 80 MG tablet Take 80 mg by mouth every morning.   03/28/2021   gabapentin (NEURONTIN) 600 MG tablet Take 1 tablet (600 mg total) by mouth 2 (two) times daily. 60 tablet 1 03/28/2021   hydrOXYzine (ATARAX) 25 MG tablet Take 25 mg by mouth 3 (three) times daily. For anxiety   03/28/2021   Iron, Ferrous Sulfate, 325 (65 Fe) MG TABS Take 325 mg by mouth daily. 60 tablet 2 03/28/2021   ketoconazole (NIZORAL) 2 % cream Apply 1 application. topically daily. Apply to finger nails   03/28/2021   lisdexamfetamine (VYVANSE) 10 MG capsule Take 10 mg by mouth daily at 12 noon.   3/12 or 3/13   montelukast (SINGULAIR) 10 MG tablet Take 1 tablet (10 mg total) by mouth at bedtime. 30 tablet 6 03/27/2021   Multiple Vitamin (MULTIVITAMIN WITH MINERALS) TABS tablet Take 1 tablet by mouth daily.   03/28/2021   omeprazole (PRILOSEC) 40 MG capsule TAKE 1 CAPSULE(40 MG) BY MOUTH DAILY (Patient taking differently: Take 40 mg by mouth every morning.) 30 capsule 5 03/28/2021   oxybutynin (DITROPAN) 5 MG tablet TAKE 1 TABLET(5 MG) BY MOUTH TWICE DAILY (Patient taking differently: Take 5 mg by mouth 2 (two) times daily.) 60 tablet 5 03/28/2021   potassium chloride SA (KLOR-CON M) 20 MEQ tablet Take 1 tablet (20 mEq total) by mouth daily. (Patient taking differently: Take 40 mEq by mouth 2 (two) times daily.) 30 tablet 0 03/28/2021 at am   prazosin (MINIPRESS) 2 MG capsule Take 1 capsule (2 mg total) by mouth at bedtime. 30 capsule 0 03/27/2021   pregabalin (LYRICA) 75 MG capsule Take 75 mg by mouth 2 (two) times daily.   03/28/2021   sodium chloride (OCEAN) 0.65 % SOLN nasal spray Place 2 sprays into both nostrils daily.   Past Week  terbinafine (LAMISIL AT) 1 % cream Apply 1 application topically 2  (two) times daily. (Patient taking differently: Apply 1 application. topically 3 (three) times daily. Apply to feet) 30 g 1 Past Week   cyanocobalamin 1000 MCG tablet Take 1 tablet (1,000 mcg total) by mouth daily. (Patient not taking: Reported on 03/30/2021) 90 tablet 1 Not Taking   cyclobenzaprine (FLEXERIL) 10 MG tablet Take 1 tablet (10 mg total) by mouth 2 (two) times daily as needed. (Patient not taking: Reported on 03/30/2021) 180 tablet 0 Not Taking   furosemide (LASIX) 40 MG tablet Take 1 tablet (40 mg total) by mouth daily. (Patient not taking: Reported on 03/30/2021) 30 tablet 1 Not Taking   lidocaine (LIDODERM) 5 % Place 1 patch onto the skin daily. Remove & Discard patch within 12 hours or as directed by MD (Patient not taking: Reported on 03/30/2021) 30 patch 1 Not Taking   thiamine 100 MG tablet Take 1 tablet (100 mg total) by mouth daily. (Patient not taking: Reported on 03/30/2021)   Not Taking   torsemide (DEMADEX) 20 MG tablet Take 1 tablet (20 mg total) by mouth daily. (Patient not taking: Reported on 03/30/2021) 6 tablet 0 Not Taking   Scheduled:  gabapentin  600 mg Oral BID   Continuous: [id number], fentaNYL (SUBLIMAZE) injection  Results for orders placed or performed during the hospital encounter of 03/28/21 (from the past 48 hour(s))  Comprehensive metabolic panel     Status: Abnormal   Collection Time: 03/28/21  6:43 PM  Result Value Ref Range   Sodium 139 135 - 145 mmol/L   Potassium 3.9 3.5 - 5.1 mmol/L   Chloride 103 98 - 111 mmol/L   CO2 27 22 - 32 mmol/L   Glucose, Bld 87 70 - 99 mg/dL    Comment: Glucose reference range applies only to samples taken after fasting for at least 8 hours.   BUN 29 (H) 8 - 23 mg/dL   Creatinine, Ser 1.47 (H) 0.44 - 1.00 mg/dL   Calcium 9.7 8.9 - 82.9 mg/dL   Total Protein 6.6 6.5 - 8.1 g/dL   Albumin 3.4 (L) 3.5 - 5.0 g/dL   AST 34 15 - 41 U/L   ALT 24 0 - 44 U/L   Alkaline Phosphatase 202 (H) 38 - 126 U/L   Total Bilirubin  0.4 0.3 - 1.2 mg/dL   GFR, Estimated 27 (L) >60 mL/min    Comment: (NOTE) Calculated using the CKD-EPI Creatinine Equation (2021)    Anion gap 9 5 - 15    Comment: Performed at [facility], [address] [place], [place] [address]  CBC with Differential     Status: Abnormal   Collection Time: 03/28/21  6:43 PM  Result Value Ref Range   WBC 6.1 4.0 - 10.5 K/uL   RBC 3.88 3.87 - 5.11 MIL/uL   Hemoglobin 10.6 (L) 12.0 - 15.0 g/dL   HCT 08.6 (L) 57.8 - 46.9 %   MCV 90.2 80.0 - 100.0 fL   MCH 27.3 26.0 - 34.0 pg   MCHC 30.3 30.0 - 36.0 g/dL   RDW 62.9 (H) 52.8 - 41.3 %   Platelets 405 (H) 150 - 400 K/uL   nRBC 0.0 0.0 - 0.2 %   Neutrophils Relative % 51 %   Neutro Abs 3.1 1.7 - 7.7 K/uL   Lymphocytes Relative 33 %   Lymphs Abs 2.0 0.7 - 4.0 K/uL   Monocytes Relative 11 %   Monocytes Absolute  0.7 0.1 - 1.0 K/uL   Eosinophils Relative 4 %   Eosinophils Absolute 0.2 0.0 - 0.5 K/uL   Basophils Relative 1 %   Basophils Absolute 0.0 0.0 - 0.1 K/uL   Immature Granulocytes 0 %   Abs Immature Granulocytes 0.02 0.00 - 0.07 K/uL    Comment: Performed at [facility], [address] [place], [place] [address]  CBG monitoring, ED     Status: None   Collection Time: 03/28/21  6:54 PM  Result Value Ref Range   Glucose-Capillary 74 70 - 99 mg/dL    Comment: Glucose reference range applies only to samples taken after fasting for at least 8 hours.   Comment 1 Notify RN    Comment 2 Document in Chart   CBC with Differential     Status: Abnormal   Collection Time: 03/29/21  2:02 PM  Result Value Ref Range   WBC 4.2 4.0 - 10.5 K/uL   RBC 3.50 (L) 3.87 - 5.11 MIL/uL   Hemoglobin 9.5 (L) 12.0 - 15.0 g/dL   HCT 60.4 (L) 54.0 - 98.1 %   MCV 90.6 80.0 - 100.0 fL   MCH 27.1 26.0 - 34.0 pg   MCHC 30.0 30.0 - 36.0 g/dL   RDW 19.1 (H) 47.8 - 29.5 %   Platelets 387 150 - 400 K/uL   nRBC 0.0 0.0 - 0.2 %   Neutrophils Relative % 57 %   Neutro Abs 2.4 1.7 - 7.7 K/uL   Lymphocytes  Relative 26 %   Lymphs Abs 1.1 0.7 - 4.0 K/uL   Monocytes Relative 10 %   Monocytes Absolute 0.4 0.1 - 1.0 K/uL   Eosinophils Relative 5 %   Eosinophils Absolute 0.2 0.0 - 0.5 K/uL   Basophils Relative 1 %   Basophils Absolute 0.0 0.0 - 0.1 K/uL   Immature Granulocytes 1 %   Abs Immature Granulocytes 0.02 0.00 - 0.07 K/uL    Comment: Performed at [facility], [address] [place], [place] [address]  Comprehensive metabolic panel     Status: Abnormal   Collection Time: 03/29/21  2:02 PM  Result Value Ref Range   Sodium 141 135 - 145 mmol/L   Potassium 4.0 3.5 - 5.1 mmol/L   Chloride 108 98 - 111 mmol/L   CO2 24 22 - 32 mmol/L   Glucose, Bld 99 70 - 99 mg/dL    Comment: Glucose reference range applies only to samples taken after fasting for at least 8 hours.   BUN 33 (H) 8 - 23 mg/dL   Creatinine, Ser 8.65 (H) 0.44 - 1.00 mg/dL   Calcium 8.9 8.9 - 78.4 mg/dL   Total Protein 5.8 (L) 6.5 - 8.1 g/dL   Albumin 3.1 (L) 3.5 - 5.0 g/dL   AST 27 15 - 41 U/L   ALT 19 0 - 44 U/L   Alkaline Phosphatase 178 (H) 38 - 126 U/L   Total Bilirubin 0.3 0.3 - 1.2 mg/dL   GFR, Estimated 33 (L) >60 mL/min    Comment: (NOTE) Calculated using the CKD-EPI Creatinine Equation (2021)    Anion gap 9 5 - 15    Comment: Performed at [facility], [address], [place], [place] [address]  Lipase, blood     Status: None   Collection Time: 03/29/21  2:02 PM  Result Value Ref Range   Lipase 34 11 - 51 U/L    Comment: Performed at [facility],  [address] [place], [place] [address]  Protime-INR     Status: None   Collection Time: 03/29/21  2:02 PM  Result Value Ref Range   Prothrombin Time 14.7 11.4 - 15.2 seconds   INR 1.2 0.8 - 1.2    Comment: (NOTE) INR goal varies based on device and disease states. Performed at [facility], [address] [place], [place] [address]   Type and screen [facility]     Status: None (Preliminary result)   Collection  Time: 03/29/21  3:50 PM  Result Value Ref Range   ABO/RH(D) O POS    Antibody Screen NEG    Sample Expiration 04/01/2021,2359    Unit Number [id number]    Blood Component Type RED CELLS,LR    Unit division 00    Status of Unit ALLOCATED    Donor AG Type NEGATIVE FOR E ANTIGEN NEGATIVE FOR S ANTIGEN    Transfusion Status OK TO TRANSFUSE    Crossmatch Result COMPATIBLE    Unit Number [id number]    Blood Component Type RBC LR PHER1    Unit division 00    Status of Unit ALLOCATED    Donor AG Type NEGATIVE FOR E ANTIGEN NEGATIVE FOR S ANTIGEN    Transfusion Status OK TO TRANSFUSE    Crossmatch Result COMPATIBLE   CBC     Status: Abnormal   Collection Time: 03/30/21  2:48 AM  Result Value Ref Range   WBC 6.6 4.0 - 10.5 K/uL   RBC 2.56 (L) 3.87 - 5.11 MIL/uL   Hemoglobin 7.2 (L) 12.0 - 15.0 g/dL   HCT 62.9 (L) 52.8 - 41.3 %   MCV 89.5 80.0 - 100.0 fL   MCH 28.1 26.0 - 34.0 pg   MCHC 31.4 30.0 - 36.0 g/dL   RDW 24.4 (H) 01.0 - 27.2 %   Platelets 290 150 - 400 K/uL   nRBC 0.0 0.0 - 0.2 %    Comment: Performed at [facility], [address] [place], [place] [address]  Basic metabolic panel     Status: Abnormal   Collection Time: 03/30/21  2:48 AM  Result Value Ref Range   Sodium 137 135 - 145 mmol/L   Potassium 4.0 3.5 - 5.1 mmol/L   Chloride 103 98 - 111 mmol/L   CO2 27 22 - 32 mmol/L   Glucose, Bld 87 70 - 99 mg/dL    Comment: Glucose reference range applies only to samples taken after fasting for at least 8 hours.   BUN 35 (H) 8 - 23 mg/dL   Creatinine, Ser 4.03 (H) 0.44 - 1.00 mg/dL   Calcium 8.6 (L) 8.9 - 10.3 mg/dL   GFR, Estimated 35 (L) >60 mL/min    Comment: (NOTE) Calculated using the CKD-EPI Creatinine Equation (2021)    Anion gap 7 5 - 15    Comment: Performed at [facility], [address] [place], [place] [address]   *Note: Due to a large number of results and/or encounters for the requested time period, some results have not been  displayed. A complete set of results can be found in Results Review.    CT ABDOMEN PELVIS WO CONTRAST  Result Date: 03/29/2021 CLINICAL DATA:  Left lower quadrant abdominal pain. Rectal bleeding. EXAM: CT ABDOMEN AND PELVIS WITHOUT CONTRAST TECHNIQUE: Multidetector CT imaging of the abdomen and pelvis was performed following the standard protocol without IV contrast. RADIATION DOSE REDUCTION: This exam was performed according  to the departmental dose-optimization program which includes automated exposure control, adjustment of the mA and/or kV according to patient size and/or use of iterative reconstruction technique. COMPARISON:  07/09/2020 and pelvic ultrasound 07/23/2020 FINDINGS: Lower chest: Lung bases are clear. Hepatobiliary: Gallbladder is mildly distended with high-density stones. Normal appearance of the liver. Pancreas: Unremarkable. No pancreatic ductal dilatation or surrounding inflammatory changes. Spleen: Normal in size without focal abnormality. Adrenals/Urinary Tract: Fullness of the left adrenal gland with Hounsfield units measuring approximately 8. There could be a small left adrenal adenoma. Right adrenal gland is unremarkable. Both kidneys are small without hydronephrosis. Moderate distention of the urinary bladder. Stomach/Bowel: Large amount of stool in the rectum. Normal appearance of the stomach. Left colon and sigmoid colon are mildly distended and this may be secondary to the large amount of rectal stool. There is no small bowel dilatation. Vascular/Lymphatic: No significant lymphadenopathy in the abdomen or pelvis. Negative for an abdominal aortic aneurysm. Reproductive: Hysterectomy. Again noted is a large low-density structure involving the left adnexa that measures 7.4 x 5.9 x 6.9 cm and measured approximately 7.4 x 7.1 x 7.5 cm on 08/08/2020. Overall, this structure has minimally changed. This was compatible with a simple cyst on previous ultrasound. No evidence for a right  adnexal lesion. Other: Negative for ascites.  Negative for free air. Musculoskeletal: Progressive disc space loss at L3-L4. There is also increased anterolisthesis of L3 on L4 measuring up to 1.0 cm and previously measured 0.7 cm. Anterolisthesis appears to be related to facet arthropathy. IMPRESSION: 1. Persistent large left adnexal cyst. Left adnexal cyst has minimally changed since 2022. 2. Large amount of stool in the rectum. Sigmoid colon and left colon are mildly distended and may be secondary to the rectal stool and constipation. Cannot evaluate for GI bleeding on this non contrast examination. 3. Cholelithiasis.  Mild gallbladder distension. 4. Progressive disc space loss and anterolisthesis at L3-L4. Electronically Signed   By: Richarda Overlie M.D.   On: 03/29/2021 16:54    Review of Systems  Constitutional: Negative.   HENT: Negative.    Eyes: Negative.   Respiratory: Negative.    Cardiovascular:  Positive for leg swelling.  Gastrointestinal:  Positive for blood in stool. Negative for abdominal distention, abdominal pain and anal bleeding.  Endocrine: Negative.   Genitourinary: Negative.   Musculoskeletal:  Positive for arthralgias.  Allergic/Immunologic: Negative.   Neurological:  Positive for weakness. Negative for tremors and syncope.  Hematological: Negative.   Psychiatric/Behavioral:  Positive for agitation and dysphoric mood. Negative for behavioral problems, confusion, decreased concentration and hallucinations. The patient is nervous/anxious. The patient is not hyperactive.   Blood pressure 128/89, pulse 80, temperature 98.2 F (36.8 C), temperature source Oral, resp. rate 14, SpO2 97 %. Physical Exam Vitals reviewed.  Constitutional:      General: [name] not in acute distress.    Appearance: [name] obese. [name] not ill-appearing.  HENT:     Head: Normocephalic and atraumatic.     Mouth/Throat:     Mouth: Mucous membranes are dry.  Eyes:     Extraocular Movements:  Extraocular movements intact.     Pupils: Pupils are equal, round, and reactive to light.  Cardiovascular:     Rate and Rhythm: Normal rate and regular rhythm.  Pulmonary:     Effort: Pulmonary effort is normal.     Breath sounds: Normal breath sounds.  Abdominal:     General: Bowel sounds are normal. There is no distension.  Tenderness: There is no abdominal tenderness. There is no guarding.  Musculoskeletal:     Cervical back: Normal range of motion and neck supple.  Skin:    General: Skin is warm and dry.  Neurological:     Mental Status: [name] alert and oriented to person, place, and time.  Psychiatric:        Mood and Affect: Mood normal.        Behavior: Behavior normal.        Thought Content: Thought content normal.        Judgment: Judgment normal.   Assessment/Plan: 1) Rectal bleeding/colonic diverticulosis-with drop in hemoglobin down to 7.2 g/dL-we will prep for the patient for colonoscopy tomorrow.  2) Abnormal CT scan with large amount of stool in the rectum and left colon. 3) GERD/Asymptomatic cholelithiasis. 4) History of a respiratory arrest with COPD exacerbation PEA and anoxic brain injury. 5)M CKD Stage III. 6) COPD/Emphysema. 7) HTN/hyperlipidemia. 8)   Charna Elizabeth 03/30/2021, 6:20 AM

## 2021-03-30 NOTE — Telephone Encounter (Signed)
Patient currently admitted to the hospital and documentation notes that SNF placement is being considered ?
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
## 2021-03-30 NOTE — Assessment & Plan Note (Signed)
-   Stable, not in exacerbation ?

## 2021-03-31 DIAGNOSIS — E785 Hyperlipidemia, unspecified: Secondary | ICD-10-CM

## 2021-03-31 DIAGNOSIS — K922 Gastrointestinal hemorrhage, unspecified: Secondary | ICD-10-CM | POA: Diagnosis not present

## 2021-03-31 HISTORY — DX: Hyperlipidemia, unspecified: E78.5

## 2021-03-31 LAB — CBC
HCT: 22.4 % — ABNORMAL LOW (ref 36.0–46.0)
HCT: 25.1 % — ABNORMAL LOW (ref 36.0–46.0)
HCT: 27.8 % — ABNORMAL LOW (ref 36.0–46.0)
Hemoglobin: 7.1 g/dL — ABNORMAL LOW (ref 12.0–15.0)
Hemoglobin: 8.3 g/dL — ABNORMAL LOW (ref 12.0–15.0)
Hemoglobin: 9 g/dL — ABNORMAL LOW (ref 12.0–15.0)
MCH: 27.6 pg (ref 26.0–34.0)
MCH: 27.9 pg (ref 26.0–34.0)
MCH: 28.1 pg (ref 26.0–34.0)
MCHC: 31.7 g/dL (ref 30.0–36.0)
MCHC: 32.4 g/dL (ref 30.0–36.0)
MCHC: 33.1 g/dL (ref 30.0–36.0)
MCV: 85.1 fL (ref 80.0–100.0)
MCV: 86.1 fL (ref 80.0–100.0)
MCV: 87.2 fL (ref 80.0–100.0)
Platelets: 225 10*3/uL (ref 150–400)
Platelets: 240 10*3/uL (ref 150–400)
Platelets: 264 10*3/uL (ref 150–400)
RBC: 2.57 MIL/uL — ABNORMAL LOW (ref 3.87–5.11)
RBC: 2.95 MIL/uL — ABNORMAL LOW (ref 3.87–5.11)
RBC: 3.23 MIL/uL — ABNORMAL LOW (ref 3.87–5.11)
RDW: 17.2 % — ABNORMAL HIGH (ref 11.5–15.5)
RDW: 17.2 % — ABNORMAL HIGH (ref 11.5–15.5)
RDW: 17.5 % — ABNORMAL HIGH (ref 11.5–15.5)
WBC: 5.4 10*3/uL (ref 4.0–10.5)
WBC: 5.9 10*3/uL (ref 4.0–10.5)
WBC: 7.9 10*3/uL (ref 4.0–10.5)
nRBC: 0 % (ref 0.0–0.2)
nRBC: 0 % (ref 0.0–0.2)
nRBC: 0 % (ref 0.0–0.2)

## 2021-03-31 LAB — HEMOGLOBIN AND HEMATOCRIT, BLOOD
HCT: 21 % — ABNORMAL LOW (ref 36.0–46.0)
Hemoglobin: 6.9 g/dL — CL (ref 12.0–15.0)

## 2021-03-31 LAB — PREPARE RBC (CROSSMATCH)

## 2021-03-31 MED ORDER — SODIUM CHLORIDE 0.9% IV SOLUTION: Freq: Once | INTRAVENOUS | Status: AC

## 2021-03-31 MED ORDER — MELATONIN 3 MG PO TABS
3.0000 mg | ORAL_TABLET | Freq: Once | ORAL | Status: AC
  Administered 2021-03-31: 3 mg via ORAL
  Filled 2021-03-31: qty 1

## 2021-03-31 MED ORDER — PEG 3350-KCL-NA BICARB-NACL 420 G PO SOLR
4000.0000 mL | Freq: Once | ORAL | Status: AC
  Administered 2021-03-31: 4000 mL via ORAL
  Filled 2021-03-31: qty 4000

## 2021-03-31 MED ORDER — ASENAPINE MALEATE 5 MG SL SUBL
10.0000 mg | SUBLINGUAL_TABLET | Freq: Two times a day (BID) | SUBLINGUAL | Status: DC
  Administered 2021-03-31 – 2021-04-06 (×13): 10 mg via SUBLINGUAL
  Filled 2021-03-31 (×15): qty 2

## 2021-03-31 NOTE — Progress Notes (Signed)
Subjective: ?No acute events. ? ?Objective: ?Vital signs in last 24 hours: ?Temp:  [97.4 ?F (36.3 ?C)-98.7 ?F (37.1 ?C)] 98 ?F (36.7 ?C) (03/16 0946) ?Pulse Rate:  [69-85] 83 (03/16 0946) ?Resp:  [12-25] 19 (03/16 0946) ?BP: (107-140)/(62-94) 140/94 (03/16 0946) ?SpO2:  [97 %-100 %] 100 % (03/16 0946) ?Weight:  [73.9 kg] 73.9 kg (03/16 0728) ?Last BM Date : 03/31/21 ? ?Intake/Output from previous day: ?03/15 0701 - 03/16 0700 ?In: 6644 [P.O.:480; Blood:610] ?Out: 1800 [Urine:1800] ?Intake/Output this shift: ?Total I/O ?In: 034 [P.O.:360; Blood:375] ?Out: 700 [Urine:700] ? ?General appearance: alert and no distress ?GI: soft, non-tender; bowel sounds normal; no masses,  no organomegaly ? ?Lab Results: ?Recent Labs  ?  03/30/21 ?2214 03/31/21 ?0002 03/31/21 ?7425 03/31/21 ?1108  ?WBC 6.0 5.4  --  5.9  ?HGB 7.1* 7.1* 6.9* 8.3*  ?HCT 22.3* 22.4* 21.0* 25.1*  ?PLT 199 264  --  225  ? ?BMET ?Recent Labs  ?  03/28/21 ?1843 03/29/21 ?1402 03/30/21 ?0248  ?NA 139 141 137  ?K 3.9 4.0 4.0  ?CL 103 108 103  ?CO2 '27 24 27  '$ ?GLUCOSE 87 99 87  ?BUN 29* 33* 35*  ?CREATININE 2.04* 1.76* 1.64*  ?CALCIUM 9.7 8.9 8.6*  ? ?LFT ?Recent Labs  ?  03/29/21 ?1402  ?PROT 5.8*  ?ALBUMIN 3.1*  ?AST 27  ?ALT 19  ?ALKPHOS 178*  ?BILITOT 0.3  ? ?PT/INR ?Recent Labs  ?  03/29/21 ?1402  ?LABPROT 14.7  ?INR 1.2  ? ?Hepatitis Panel ?No results for input(s): HEPBSAG, HCVAB, HEPAIGM, HEPBIGM in the last 72 hours. ?C-Diff ?No results for input(s): CDIFFTOX in the last 72 hours. ?Fecal Lactopherrin ?No results for input(s): FECLLACTOFRN in the last 72 hours. ? ?Studies/Results: ?CT ABDOMEN PELVIS WO CONTRAST ? ?Result Date: 03/29/2021 ?CLINICAL DATA:  Left lower quadrant abdominal pain. Rectal bleeding. EXAM: CT ABDOMEN AND PELVIS WITHOUT CONTRAST TECHNIQUE: Multidetector CT imaging of the abdomen and pelvis was performed following the standard protocol without IV contrast. RADIATION DOSE REDUCTION: This exam was performed according to the departmental  dose-optimization program which includes automated exposure control, adjustment of the mA and/or kV according to patient size and/or use of iterative reconstruction technique. COMPARISON:  07/09/2020 and pelvic ultrasound 07/23/2020 FINDINGS: Lower chest: Lung bases are clear. Hepatobiliary: Gallbladder is mildly distended with high-density stones. Normal appearance of the liver. Pancreas: Unremarkable. No pancreatic ductal dilatation or surrounding inflammatory changes. Spleen: Normal in size without focal abnormality. Adrenals/Urinary Tract: Fullness of the left adrenal gland with Hounsfield units measuring approximately 8. There could be a small left adrenal adenoma. Right adrenal gland is unremarkable. Both kidneys are small without hydronephrosis. Moderate distention of the urinary bladder. Stomach/Bowel: Large amount of stool in the rectum. Normal appearance of the stomach. Left colon and sigmoid colon are mildly distended and this may be secondary to the large amount of rectal stool. There is no small bowel dilatation. Vascular/Lymphatic: No significant lymphadenopathy in the abdomen or pelvis. Negative for an abdominal aortic aneurysm. Reproductive: Hysterectomy. Again noted is a large low-density structure involving the left adnexa that measures 7.4 x 5.9 x 6.9 cm and measured approximately 7.4 x 7.1 x 7.5 cm on 08/08/2020. Overall, this structure has minimally changed. This was compatible with a simple cyst on previous ultrasound. No evidence for a right adnexal lesion. Other: Negative for ascites.  Negative for free air. Musculoskeletal: Progressive disc space loss at L3-L4. There is also increased anterolisthesis of L3 on L4 measuring up to 1.0 cm and  previously measured 0.7 cm. Anterolisthesis appears to be related to facet arthropathy. IMPRESSION: 1. Persistent large left adnexal cyst. Left adnexal cyst has minimally changed since 2022. 2. Large amount of stool in the rectum. Sigmoid colon and left  colon are mildly distended and may be secondary to the rectal stool and constipation. Cannot evaluate for GI bleeding on this non contrast examination. 3. Cholelithiasis.  Mild gallbladder distension. 4. Progressive disc space loss and anterolisthesis at L3-L4. Electronically Signed   By: Markus Daft M.D.   On: 03/29/2021 16:54   ? ?Medications: Scheduled: ? sodium chloride   Intravenous Once  ? asenapine  10 mg Sublingual BID  ? atorvastatin  20 mg Oral Daily  ? DULoxetine  30 mg Oral Daily  ? ferrous sulfate  325 mg Oral Daily  ? gabapentin  600 mg Oral BID  ? oxybutynin  5 mg Oral BID  ? polyethylene glycol-electrolytes  4,000 mL Oral Once  ? prazosin  2 mg Oral QHS  ? ?Continuous: ? sodium chloride    ? ? ?Assessment/Plan: ?1) Hematochezia. ?2) Anemia. ? ? The colonoscopy procedure was explained to the patient.  [name] agreeable to the procedure tomorrow. ? ?Plan: ?1) Colonoscopy tomorrow. ? LOS: 1 day  ? ?Jaimarie Rapozo D ?03/31/2021, 4:30 PM  ?

## 2021-03-31 NOTE — Plan of Care (Signed)
  Problem: Education: Goal: Knowledge of General Education information will improve Description: Including pain rating scale, medication(s)/side effects and non-pharmacologic comfort measures Outcome: Progressing   Problem: Clinical Measurements: Goal: Respiratory complications will improve Outcome: Progressing Goal: Cardiovascular complication will be avoided Outcome: Progressing   

## 2021-03-31 NOTE — TOC Progression Note (Signed)
Transition of Care (TOC) - Progression Note  ? ? ?Patient Details  ?Name: KAEDE CLENDENEN ?MRN: [id number] ?Date of Birth: 13-Oct-1960 ? ?Transition of Care (TOC) CM/SW Contact  ?Joanne Chars, LCSW ?Phone Number: ?03/31/2021, 12:53 PM ? ?Clinical Narrative:   [name] met passr representative talking to pt in room, she completed her interview. ? ?[name] presented bed offers to pt, [facility] and [facility].  Pt met with Juliann Pulse from [facility] prior to this and indicated to her she wanted to go to [facility].  When [name] spoke with her, pt states she is worried that her family will go to [facility] to find her and she does not want this, she would like to pursue admission at [facility].   ? ? ? ?Expected Discharge Plan: [place] ?Barriers to Discharge: Continued Medical Work up ? ?Expected Discharge Plan and Services ?Expected Discharge Plan: [place] ?  ?  ?  ?Living arrangements for the past 2 months: Apartment ?                ?  ?  ?  ?  ?  ?  ?  ?  ?  ?  ? ? ?Social Determinants of Health (SDOH) Interventions ?  ? ?Readmission Risk Interventions ?Readmission Risk Prevention Plan 07/26/2020 02/23/2020  ?Transportation Screening Complete Complete  ?Medication Review Press photographer) Complete Complete  ?PCP or Specialist appointment within 3-5 days of discharge Complete Complete  ?[place] or Home Care Consult Complete Complete  ?SW Recovery Care/Counseling Consult Complete Complete  ?Palliative Care Screening Not Applicable Complete  ?Skilled Nursing Facility Complete Complete  ?Some recent data might be hidden  ? ? ?

## 2021-03-31 NOTE — H&P (View-Only) (Signed)
Subjective: ?No acute events. ? ?Objective: ?Vital signs in last 24 hours: ?Temp:  [97.4 ?F (36.3 ?C)-98.7 ?F (37.1 ?C)] 98 ?F (36.7 ?C) (03/16 0946) ?Pulse Rate:  [69-85] 83 (03/16 0946) ?Resp:  [12-25] 19 (03/16 0946) ?BP: (107-140)/(62-94) 140/94 (03/16 0946) ?SpO2:  [97 %-100 %] 100 % (03/16 0946) ?Weight:  [73.9 kg] 73.9 kg (03/16 0728) ?Last BM Date : 03/31/21 ? ?Intake/Output from previous day: ?03/15 0701 - 03/16 0700 ?In: 9678 [P.O.:480; Blood:610] ?Out: 1800 [Urine:1800] ?Intake/Output this shift: ?Total I/O ?In: 938 [P.O.:360; Blood:375] ?Out: 700 [Urine:700] ? ?General appearance: alert and no distress ?GI: soft, non-tender; bowel sounds normal; no masses,  no organomegaly ? ?Lab Results: ?Recent Labs  ?  03/30/21 ?2214 03/31/21 ?0002 03/31/21 ?1017 03/31/21 ?1108  ?WBC 6.0 5.4  --  5.9  ?HGB 7.1* 7.1* 6.9* 8.3*  ?HCT 22.3* 22.4* 21.0* 25.1*  ?PLT 199 264  --  225  ? ?BMET ?Recent Labs  ?  03/28/21 ?1843 03/29/21 ?1402 03/30/21 ?0248  ?NA 139 141 137  ?K 3.9 4.0 4.0  ?CL 103 108 103  ?CO2 '27 24 27  '$ ?GLUCOSE 87 99 87  ?BUN 29* 33* 35*  ?CREATININE 2.04* 1.76* 1.64*  ?CALCIUM 9.7 8.9 8.6*  ? ?LFT ?Recent Labs  ?  03/29/21 ?1402  ?PROT 5.8*  ?ALBUMIN 3.1*  ?AST 27  ?ALT 19  ?ALKPHOS 178*  ?BILITOT 0.3  ? ?PT/INR ?Recent Labs  ?  03/29/21 ?1402  ?LABPROT 14.7  ?INR 1.2  ? ?Hepatitis Panel ?No results for input(s): HEPBSAG, HCVAB, HEPAIGM, HEPBIGM in the last 72 hours. ?C-Diff ?No results for input(s): CDIFFTOX in the last 72 hours. ?Fecal Lactopherrin ?No results for input(s): FECLLACTOFRN in the last 72 hours. ? ?Studies/Results: ?CT ABDOMEN PELVIS WO CONTRAST ? ?Result Date: 03/29/2021 ?CLINICAL DATA:  Left lower quadrant abdominal pain. Rectal bleeding. EXAM: CT ABDOMEN AND PELVIS WITHOUT CONTRAST TECHNIQUE: Multidetector CT imaging of the abdomen and pelvis was performed following the standard protocol without IV contrast. RADIATION DOSE REDUCTION: This exam was performed according to the departmental  dose-optimization program which includes automated exposure control, adjustment of the mA and/or kV according to patient size and/or use of iterative reconstruction technique. COMPARISON:  07/09/2020 and pelvic ultrasound 07/23/2020 FINDINGS: Lower chest: Lung bases are clear. Hepatobiliary: Gallbladder is mildly distended with high-density stones. Normal appearance of the liver. Pancreas: Unremarkable. No pancreatic ductal dilatation or surrounding inflammatory changes. Spleen: Normal in size without focal abnormality. Adrenals/Urinary Tract: Fullness of the left adrenal gland with Hounsfield units measuring approximately 8. There could be a small left adrenal adenoma. Right adrenal gland is unremarkable. Both kidneys are small without hydronephrosis. Moderate distention of the urinary bladder. Stomach/Bowel: Large amount of stool in the rectum. Normal appearance of the stomach. Left colon and sigmoid colon are mildly distended and this may be secondary to the large amount of rectal stool. There is no small bowel dilatation. Vascular/Lymphatic: No significant lymphadenopathy in the abdomen or pelvis. Negative for an abdominal aortic aneurysm. Reproductive: Hysterectomy. Again noted is a large low-density structure involving the left adnexa that measures 7.4 x 5.9 x 6.9 cm and measured approximately 7.4 x 7.1 x 7.5 cm on 08/08/2020. Overall, this structure has minimally changed. This was compatible with a simple cyst on previous ultrasound. No evidence for a right adnexal lesion. Other: Negative for ascites.  Negative for free air. Musculoskeletal: Progressive disc space loss at L3-L4. There is also increased anterolisthesis of L3 on L4 measuring up to 1.0 cm and  previously measured 0.7 cm. Anterolisthesis appears to be related to facet arthropathy. IMPRESSION: 1. Persistent large left adnexal cyst. Left adnexal cyst has minimally changed since 2022. 2. Large amount of stool in the rectum. Sigmoid colon and left  colon are mildly distended and may be secondary to the rectal stool and constipation. Cannot evaluate for GI bleeding on this non contrast examination. 3. Cholelithiasis.  Mild gallbladder distension. 4. Progressive disc space loss and anterolisthesis at L3-L4. Electronically Signed   By: Markus Daft M.[name].   On: 03/29/2021 16:54   ? ?Medications: Scheduled: ? sodium chloride   Intravenous Once  ? asenapine  10 mg Sublingual BID  ? atorvastatin  20 mg Oral Daily  ? DULoxetine  30 mg Oral Daily  ? ferrous sulfate  325 mg Oral Daily  ? gabapentin  600 mg Oral BID  ? oxybutynin  5 mg Oral BID  ? polyethylene glycol-electrolytes  4,000 mL Oral Once  ? prazosin  2 mg Oral QHS  ? ?Continuous: ? sodium chloride    ? ? ?Assessment/Plan: ?1) Hematochezia. ?2) Anemia. ? ? The colonoscopy procedure was explained to the patient.  [name] agreeable to the procedure tomorrow. ? ?Plan: ?1) Colonoscopy tomorrow. ? LOS: 1 day  ? ?Tiffany [name] ?03/31/2021, 4:30 PM  ?

## 2021-03-31 NOTE — Progress Notes (Signed)
Pt has continued to refuse to drink the bowel prep all night, stating it is causing her to have bloody bowel movements. Explained to pt that CT results showed stool in her bowel, and in order to have her colonoscopy today she would need to have frequent movements to clear her bowels. Pt has had several (more than eight) very large bloody bowel movements this shift, and it is difficult to say if it stool, blood clots, or both. Pt has received 1 unit PRBC per order. Repeat Hgb=6.9, MD notified. ?
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
## 2021-03-31 NOTE — Progress Notes (Addendum)
?PROGRESS NOTE ? ?Tiffany [name] HMC:[id number] DOB: 12-May-1960 DOA: 03/28/2021 ?PCP: [name], MD ? ? LOS: 1 day  ? ?Brief Narrative / Interim history: ?Tiffany [name] is a 61 y.o. female with medical history significant of Anoxic brain injury s/p PEA arrest in 6629, chronic diastolic heart failure, COPD, hypertension, type 2 diabetes, seizure, PTSD/schizoaffective disorder who hospitalist is being called for rectal bleeding. She had several ED visits for LE pain, was seen by PT and was awaiting SNF placement, when she noted to have several large blood clots on her briefs. ? ?Subjective / 24h Interval events: ?Complains of diarrhea and more bleeding, thinks that she is bleeding a lot more since she started the prep ? ?Assesement and Plan: ?Principal Problem: ?  Acute GI bleeding ?Active Problems: ?  Anemia ?  Diabetic neuropathy ([place]) ?  COPD (chronic obstructive pulmonary disease) ([place]) ?  Essential hypertension ?  Schizophrenia, schizo-affective ([place]) ?  GI bleed ?  Hyperlipidemia ? ? ?Assessment and Plan: ?Principal problem ?Acute GI bleeding, likely lower - Patient initially was boarding in the ED awaiting SNF placement but developed rectal bleeding and noted to have downward trending Hgb. She has documented colonoscopy and EGD for GI bleed in 2021 in outside facility in [place].  Unfortunately we do not have records of the findings.  GI consulted.  Patient was planned to get a prep overnight, she started it but started having more frequent bowel movements, and refusing to drink further MiraLAX. ? ?Active problems ?Acute blood loss anemia -with persistent bleeding, especially more obvious since she started the prep.  Received a total of 3 unit packed red blood cells, including this morning.  Continue to monitor hemoglobin.  Continue iron ? ?LE neuropathy ([place]) -Has chronic lower extremity pain with increasing weakness / pain.  Continue gabapentin '600MG'$  BID. Cannot use other pain agents due to multiple  significant allergies ? ?Schizophrenia, schizo-affective ([place]) - appears appropriate this morning, start back her home asenapine.  Continue duloxetine as well ? ?CKD 3b - Cr at baseline ? ?Essential hypertension -hold home medications with concern for GI blees ? ?COPD (chronic obstructive pulmonary disease) ([facility]) -Stable, not in exacerbation ? ?Hyperlipidemia-continue statin ? ? ?Scheduled Meds: ? sodium chloride   Intravenous Once  ? asenapine  10 mg Sublingual BID  ? atorvastatin  20 mg Oral Daily  ? DULoxetine  30 mg Oral Daily  ? ferrous sulfate  325 mg Oral Daily  ? gabapentin  600 mg Oral BID  ? oxybutynin  5 mg Oral BID  ? prazosin  2 mg Oral QHS  ? ?Continuous Infusions: ? sodium chloride    ? ?PRN Meds:.acetaminophen, fentaNYL (SUBLIMAZE) injection ? ?Diet Orders (From admission, onward)  ? ?  Start     Ordered  ? 03/31/21 0900  Diet NPO time specified  Diet effective now       ? 03/30/21 1635  ? ?  ?  ? ?  ? ? ?DVT prophylaxis: SCDs Start: 03/29/21 2013 ? ? ?Lab Results  ?Component Value Date  ? PLT 264 03/31/2021  ? ? ?  Code Status: Full Code ? ?Family Communication: no family at bedside  ? ?Status is: Inpatient ? ?ABLA, GI bleed ? ? ?Level of care: Progressive ? ?Consultants:  ?GI ? ?Procedures:  ?none ? ?Microbiology  ?none ? ?Antimicrobials: ?none  ? ? ?Objective: ?Vitals:  ? 03/31/21 0139 03/31/21 0403 03/31/21 4765 03/31/21 0728  ?BP: 125/85 116/84 132/79 138/84  ?Pulse: 74 83 73  69  ?Resp: 14 (!) '25 17 12  '$ ?Temp: 98.2 ?F (36.8 ?C) 98.1 ?F (36.7 ?C) (!) 97.4 ?F (36.3 ?C) 97.8 ?F (36.6 ?C)  ?TempSrc: Oral Oral Oral   ?SpO2: 97% 97% 99% 98%  ?Weight:    73.9 kg  ?Height:    '5\' 2"'$  (1.575 m)  ? ? ?Intake/Output Summary (Last 24 hours) at 03/31/2021 0940 ?Last data filed at 03/31/2021 0403 ?Gross per 24 hour  ?Intake 850 ml  ?Output 1800 ml  ?Net -950 ml  ? ? ?Wt Readings from Last 3 Encounters:  ?03/31/21 73.9 kg  ?03/26/21 74.8 kg  ?03/19/21 72.6 kg  ? ? ?Examination: ? ?Constitutional: NAD ?Eyes: lids  and conjunctivae normal, no scleral icterus ?ENMT: mmm ?Neck: normal, supple ?Respiratory: clear to auscultation bilaterally, no wheezing, no crackles. Normal respiratory effort.  ?Cardiovascular: Regular rate and rhythm, no murmurs / rubs / gallops. No LE edema. ?Abdomen: soft, no distention, no tenderness. Bowel sounds positive.  ?Skin: no rashes ?Neurologic: no focal deficits, equal strength ? ? ?Data Reviewed: I have independently reviewed following labs and imaging studies ? ?CBC ?Recent Labs  ?Lab 03/28/21 ?1843 03/29/21 ?1402 03/30/21 ?0248 03/30/21 ?1430 03/30/21 ?2214 03/31/21 ?0002 03/31/21 ?9924  ?WBC 6.1 4.2 6.6  --  6.0 5.4  --   ?HGB 10.6* 9.5* 7.2* 7.7* 7.1* 7.1* 6.9*  ?HCT 35.0* 31.7* 22.9* 24.1* 22.3* 22.4* 21.0*  ?PLT 405* 387 290  --  199 264  --   ?MCV 90.2 90.6 89.5  --  85.8 87.2  --   ?MCH 27.3 27.1 28.1  --  27.3 27.6  --   ?MCHC 30.3 30.0 31.4  --  31.8 31.7  --   ?RDW 15.6* 15.6* 15.6*  --  17.4* 17.5*  --   ?LYMPHSABS 2.0 1.1  --   --   --   --   --   ?MONOABS 0.7 0.4  --   --   --   --   --   ?EOSABS 0.2 0.2  --   --   --   --   --   ?BASOSABS 0.0 0.0  --   --   --   --   --   ? ? ? ?Recent Labs  ?Lab 03/28/21 ?1843 03/29/21 ?1402 03/30/21 ?0248  ?NA 139 141 137  ?K 3.9 4.0 4.0  ?CL 103 108 103  ?CO2 '27 24 27  '$ ?GLUCOSE 87 99 87  ?BUN 29* 33* 35*  ?CREATININE 2.04* 1.76* 1.64*  ?CALCIUM 9.7 8.9 8.6*  ?AST 34 27  --   ?ALT 24 19  --   ?ALKPHOS 202* 178*  --   ?BILITOT 0.4 0.3  --   ?ALBUMIN 3.4* 3.1*  --   ?INR  --  1.2  --   ? ? ? ?------------------------------------------------------------------------------------------------------------------ ?No results for input(s): CHOL, HDL, LDLCALC, TRIG, CHOLHDL, LDLDIRECT in the last 72 hours. ? ?Lab Results  ?Component Value Date  ? HGBA1C 5.3 07/23/2020  ? ?------------------------------------------------------------------------------------------------------------------ ?No results for input(s): TSH, T4TOTAL, T3FREE, THYROIDAB in the last 72  hours. ? ?Invalid input(s): FREET3 ? ?Cardiac Enzymes ?No results for input(s): CKMB, TROPONINI, MYOGLOBIN in the last 168 hours. ? ?Invalid input(s): CK ?------------------------------------------------------------------------------------------------------------------ ?   ?Component Value Date/Time  ? BNP 27.7 03/18/2021 1010  ? ? ?CBG: ?Recent Labs  ?Lab 03/28/21 ?2683  ?GLUCAP 74  ? ? ? ?No results found for this or any previous visit (from the past 240 hour(s)).  ? ?Radiology Studies: ?No  results found. ? ? ?Marzetta Board, MD, PhD ?[facility] ? ?Between 7 am - 7 pm I am available, please contact me via Amion (for emergencies) or Securechat (non urgent messages) ? ?Between 7 pm - 7 am I am not available, please contact night coverage MD/APP via Amion ? ?

## 2021-03-31 NOTE — Progress Notes (Signed)
Endo called and said MD said they will not be able to do colonoscopy today since pt wasn't able to drink bowel prep. Pt may resume clear liquid diet for today.  ?
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
## 2021-04-01 ENCOUNTER — Inpatient Hospital Stay (HOSPITAL_COMMUNITY): Payer: Medicare Other | Admitting: Certified Registered"

## 2021-04-01 ENCOUNTER — Encounter (HOSPITAL_COMMUNITY): Payer: Self-pay | Admitting: Internal Medicine

## 2021-04-01 ENCOUNTER — Encounter (HOSPITAL_COMMUNITY)
Admission: EM | Disposition: A | Discharge status: Skilled Nursing Facility | Payer: Self-pay | Source: Home / Self Care | Attending: Internal Medicine

## 2021-04-01 DIAGNOSIS — I252 Old myocardial infarction: Secondary | ICD-10-CM

## 2021-04-01 DIAGNOSIS — K5731 Diverticulosis of large intestine without perforation or abscess with bleeding: Secondary | ICD-10-CM

## 2021-04-01 DIAGNOSIS — K922 Gastrointestinal hemorrhage, unspecified: Secondary | ICD-10-CM | POA: Diagnosis not present

## 2021-04-01 DIAGNOSIS — I5032 Chronic diastolic (congestive) heart failure: Secondary | ICD-10-CM

## 2021-04-01 DIAGNOSIS — I110 Hypertensive heart disease with heart failure: Secondary | ICD-10-CM

## 2021-04-01 HISTORY — PX: COLONOSCOPY WITH PROPOFOL: SHX5780

## 2021-04-01 LAB — CBC
HCT: 24.9 % — ABNORMAL LOW (ref 36.0–46.0)
HCT: 20.1 % — ABNORMAL LOW (ref 36.0–46.0)
Hemoglobin: 8.3 g/dL — ABNORMAL LOW (ref 12.0–15.0)
Hemoglobin: 6.4 g/dL — CL (ref 12.0–15.0)
MCH: 28.3 pg (ref 26.0–34.0)
MCH: 27.7 pg (ref 26.0–34.0)
MCHC: 33.3 g/dL (ref 30.0–36.0)
MCHC: 31.8 g/dL (ref 30.0–36.0)
MCV: 85 fL (ref 80.0–100.0)
MCV: 87 fL (ref 80.0–100.0)
Platelets: 235 10*3/uL (ref 150–400)
Platelets: 220 10*3/uL (ref 150–400)
RBC: 2.93 MIL/uL — ABNORMAL LOW (ref 3.87–5.11)
RBC: 2.31 MIL/uL — ABNORMAL LOW (ref 3.87–5.11)
RDW: 16.9 % — ABNORMAL HIGH (ref 11.5–15.5)
RDW: 16.9 % — ABNORMAL HIGH (ref 11.5–15.5)
WBC: 6.4 10*3/uL (ref 4.0–10.5)
WBC: 6.7 10*3/uL (ref 4.0–10.5)
nRBC: 0 % (ref 0.0–0.2)
nRBC: 0 % (ref 0.0–0.2)

## 2021-04-01 LAB — PREPARE RBC (CROSSMATCH)

## 2021-04-01 LAB — GLUCOSE, CAPILLARY: Glucose-Capillary: 93 mg/dL (ref 70–99)

## 2021-04-01 SURGERY — COLONOSCOPY WITH PROPOFOL
Anesthesia: Monitor Anesthesia Care

## 2021-04-01 MED ORDER — DEXMEDETOMIDINE (PRECEDEX) IN NS 20 MCG/5ML (4 MCG/ML) IV SYRINGE
PREFILLED_SYRINGE | INTRAVENOUS | Status: DC | PRN
  Administered 2021-04-01 (×3): 4 ug via INTRAVENOUS

## 2021-04-01 MED ORDER — SODIUM CHLORIDE 0.9 % IV SOLN: INTRAVENOUS | Status: DC

## 2021-04-01 MED ORDER — PROPOFOL 500 MG/50ML IV EMUL
INTRAVENOUS | Status: DC | PRN
Start: 2021-04-01 — End: 2021-04-01
  Administered 2021-04-01: 150 ug/kg/min via INTRAVENOUS

## 2021-04-01 MED ORDER — SODIUM CHLORIDE 0.9% IV SOLUTION: Freq: Once | INTRAVENOUS | Status: DC

## 2021-04-01 MED ORDER — DICLOFENAC SODIUM 1 % EX GEL
4.0000 g | Freq: Four times a day (QID) | CUTANEOUS | Status: DC
  Administered 2021-04-01: 4 g via TOPICAL
  Filled 2021-04-01: qty 100

## 2021-04-01 MED ORDER — LIDOCAINE HCL (CARDIAC) PF 100 MG/5ML IV SOSY
PREFILLED_SYRINGE | INTRAVENOUS | Status: DC | PRN
  Administered 2021-04-01: 60 mg via INTRAVENOUS

## 2021-04-01 MED ORDER — PROPOFOL 10 MG/ML IV BOLUS
INTRAVENOUS | Status: DC | PRN
  Administered 2021-04-01 (×2): 20 mg via INTRAVENOUS
  Administered 2021-04-01: 10 mg via INTRAVENOUS
  Administered 2021-04-01 (×2): 20 mg via INTRAVENOUS

## 2021-04-01 MED ORDER — MELATONIN 5 MG PO TABS
5.0000 mg | ORAL_TABLET | Freq: Once | ORAL | Status: AC
Start: 2021-04-01 — End: 2021-04-01
  Administered 2021-04-01: 5 mg via ORAL
  Filled 2021-04-01: qty 1

## 2021-04-01 MED ORDER — PHENYLEPHRINE 40 MCG/ML (10ML) SYRINGE FOR IV PUSH (FOR BLOOD PRESSURE SUPPORT)
PREFILLED_SYRINGE | INTRAVENOUS | Status: DC | PRN
  Administered 2021-04-01: 80 ug via INTRAVENOUS

## 2021-04-01 MED ORDER — SODIUM CHLORIDE 0.9% IV SOLUTION: Freq: Once | INTRAVENOUS | Status: AC

## 2021-04-01 SURGICAL SUPPLY — 22 items
ELECT REM PT RETURN 9FT ADLT (ELECTROSURGICAL)
ELECTRODE REM PT RTRN 9FT ADLT (ELECTROSURGICAL) IMPLANT
FCP BXJMBJMB 240X2.8X (CUTTING FORCEPS)
FLOOR PAD 36X40 (MISCELLANEOUS) ×2
FORCEPS BIOP RAD 4 LRG CAP 4 (CUTTING FORCEPS) IMPLANT
FORCEPS BIOP RJ4 240 W/NDL (CUTTING FORCEPS)
FORCEPS BXJMBJMB 240X2.8X (CUTTING FORCEPS) IMPLANT
INJECTOR/SNARE I SNARE (MISCELLANEOUS) IMPLANT
LUBRICANT JELLY 4.5OZ STERILE (MISCELLANEOUS) IMPLANT
MANIFOLD NEPTUNE II (INSTRUMENTS) IMPLANT
NDL SCLEROTHERAPY 25GX240 (NEEDLE) IMPLANT
NEEDLE SCLEROTHERAPY 25GX240 (NEEDLE) IMPLANT
PAD FLOOR 36X40 (MISCELLANEOUS) ×2 IMPLANT
PROBE APC STR FIRE (PROBE) IMPLANT
PROBE INJECTION GOLD (MISCELLANEOUS)
PROBE INJECTION GOLD 7FR (MISCELLANEOUS) IMPLANT
SNARE ROTATE MED OVAL 20MM (MISCELLANEOUS) IMPLANT
SYR 50ML LL SCALE MARK (SYRINGE) IMPLANT
TRAP SPECIMEN MUCOUS 40CC (MISCELLANEOUS) IMPLANT
TUBING ENDO SMARTCAP PENTAX (MISCELLANEOUS) IMPLANT
TUBING IRRIGATION ENDOGATOR (MISCELLANEOUS) ×3 IMPLANT
WATER STERILE IRR 1000ML POUR (IV SOLUTION) IMPLANT

## 2021-04-01 NOTE — Op Note (Signed)
[facility] ?Patient Name: Tiffany [name] ?Procedure Date : 04/01/2021 ?MRN: [id number] ?Attending MD: Carol Ada , MD ?Date of Birth: 02-10-1960 ?CSN: [id number] ?Age: 61 ?Admit Type: Inpatient ?Procedure:                Colonoscopy ?Indications:              Hematochezia ?Providers:                Carol Ada, MD, Jeanella Cara, RN,  ?                          Luan Moore, Merchant navy officer, [employer] ?Referring MD:              ?Medicines:                Propofol per Anesthesia ?Complications:            No immediate complications. ?Estimated Blood Loss:     Estimated blood loss: none. ?Procedure:                Pre-Anesthesia Assessment: ?                          - Prior to the procedure, a History and Physical  ?                          was performed, and patient medications and  ?                          allergies were reviewed. The patient's tolerance of  ?                          previous anesthesia was also reviewed. The risks  ?                          and benefits of the procedure and the sedation  ?                          options and risks were discussed with the patient.  ?                          All questions were answered, and informed consent  ?                          was obtained. Prior Anticoagulants: The patient has  ?                          taken no previous anticoagulant or antiplatelet  ?                          agents. ASA Grade Assessment: III - A patient with  ?                          severe systemic disease. After reviewing the risks  ?                          and  benefits, the patient was deemed in  ?                          satisfactory condition to undergo the procedure. ?                          - Sedation was administered by an anesthesia  ?                          professional. Deep sedation was attained. ?                          After obtaining informed consent, the colonoscope  ?                          was passed under direct  vision. Throughout the  ?                          procedure, the patient's blood pressure, pulse, and  ?                          oxygen saturations were monitored continuously. The  ?                          PCF-190TL ([id number]) Olympus colonoscope was  ?                          introduced through the anus and advanced to the the  ?                          terminal ileum. The colonoscopy was technically  ?                          difficult and complex. The patient tolerated the  ?                          procedure well. The quality of the bowel  ?                          preparation was adequate. The terminal ileum,  ?                          ileocecal valve, appendiceal orifice, and rectum  ?                          were photographed. ?Scope In: 12:43:34 PM ?Scope Out: 1:30:43 PM ?Scope Withdrawal Time: 0 hours 37 minutes 39 seconds  ?Total Procedure Duration: 0 hours 47 minutes 9 seconds  ?Findings: ?     Scattered small and large-mouthed diverticula were found in the entire  ?     colon. ?     Clotted blood was found in the entire colon. ?     The colon was poorly prepped, but extensive lavage was performed to  ?     improve the visualization. A mix of dark blood and liquid stool was  ?  encountered from the rectum up to the proximal transverse colon. In the  ?     ascending colon and cecum there was evidence of blood, but it was  ?     definitely less. At one point in the transverse colon a large clot was  ?     visualized and it was felt to be the side of the bleeding, however, when  ?     it was spontaneously dislodged during the procedure there was no  ?     inducement of bleeding. Flooding the colon with water did not show any  ?     accumulation of blood or streaming of blood from any particular  ?     location. The colon was traversed twice and during the second time there  ?     was no reaccumulation of blood. ?Impression:               - Diverticulosis in the entire examined colon. ?                           - Blood in the entire examined colon. ?                          - No specimens collected. ?Recommendation:           - Return patient to hospital ward for ongoing care. ?                          - Clear liquid diet. ?                          - Continue present medications. ?                          - Follow HGB and transfuse as necessary. ?                          - If the bleeding recurs a CTA is recommended. ?Procedure Code(s):        --- Professional --- ?                          [phone], Colonoscopy, flexible; diagnostic, including  ?                          collection of specimen(s) by brushing or washing,  ?                          when performed (separate procedure) ?Diagnosis Code(s):        --- Professional --- ?                          K92.2, Gastrointestinal hemorrhage, unspecified ?                          K92.1, Melena (includes Hematochezia) ?                          K57.30, Diverticulosis of large intestine without  ?  perforation or abscess without bleeding ?CPT copyright 2019 American Medical Association. All rights reserved. ?The codes documented in this report are preliminary and upon coder review may  ?be revised to meet current compliance requirements. ?Carol Ada, MD ?Carol Ada, MD ?04/01/2021 1:57:33 PM ?This report has been signed electronically. ?Number of Addenda: 0 ?

## 2021-04-01 NOTE — Progress Notes (Signed)
PT Cancellation Note ? ?Patient Details ?Name: Tiffany [name] ?MRN: [id number] ?DOB: 06/27/1960 ? ? ?Cancelled Treatment:    Reason Eval/Treat Not Completed: Medical issues which prohibited therapy.  Painful and having larger bloody stools.  Will retry as time and pt allow. ? ? ?Ramond Dial ?04/01/2021, 9:18 AM ? ?Mee Hives, PT PhD ?Acute Rehab Dept. Number: [facility] [phone] and [place] [phone] ? ?

## 2021-04-01 NOTE — Anesthesia Postprocedure Evaluation (Signed)
Anesthesia Post Note ? ?Patient: Tiffany [name] ? ?Procedure(s) Performed: COLONOSCOPY WITH PROPOFOL ? ?  ? ?Patient location during evaluation: Endoscopy ?Anesthesia Type: MAC ?Level of consciousness: awake and alert ?Pain management: pain level controlled ?Vital Signs Assessment: post-procedure vital signs reviewed and stable ?Respiratory status: spontaneous breathing, nonlabored ventilation and respiratory function stable ?Cardiovascular status: stable and blood pressure returned to baseline ?Postop Assessment: no apparent nausea or vomiting ?Anesthetic complications: no ? ? ?No notable events documented. ? ?Last Vitals:  ?Vitals:  ? 04/01/21 1408 04/01/21 1436  ?BP: (!) 152/83 (!) 155/87  ?Pulse: 88 93  ?Resp: 16 20  ?Temp: 36.5 ?C 36.7 ?C  ?SpO2: 100% 100%  ?  ?Last Pain:  ?Vitals:  ? 04/01/21 1436  ?TempSrc: Oral  ?PainSc:   ? ? ?  ?  ?  ?  ?  ?  ? ?Lorrane Mccay,W. EDMOND ? ? ? ? ?
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
## 2021-04-01 NOTE — Progress Notes (Signed)
Pt now passing large watery bloody stools that are brighter red than the previous blood clot stools she was passing up until now. Pt has had 4 large bloody Bms since last hgb check. [name] now c/o back and abdominal pain that she has only had before following her hysterectomy several years ago. Pt stating concern she may be bleeing from her vagina and not her rectum or that she is bleeding from both. [name] also very concerned about her hgb level and is asking for a recheck and another blood transfusion at this time. VS remain stable. NP Olena Heckle notified of pt condition and concerns. Next hgb scheduled around 0800.  ?

## 2021-04-01 NOTE — Progress Notes (Signed)
Pt was able to drink about 1/2 of bowel prep solution by midnight. She has had multiple BMS overnight with large  blood clots. No stool noted to be present. Pt has been NPO since MN. ?

## 2021-04-01 NOTE — Interval H&P Note (Signed)
History and Physical Interval Note: ? ?04/01/2021 ?12:24 PM ? ?Tiffany [name]  has presented today for surgery, with the diagnosis of Rectal bleeding with posthemorrhagic anemia.  The various methods of treatment have been discussed with the patient and family. After consideration of risks, benefits and other options for treatment, the patient has consented to  Procedure(s) with comments: ?COLONOSCOPY WITH PROPOFOL (N/A) - Rectal bleeding with drop in hemoglobin to 7.2 g/dL as a surgical intervention.  The patient's history has been reviewed, patient examined, no change in status, stable for surgery.  I have reviewed the patient's chart and labs.  Questions were answered to the patient's satisfaction.   ? ? ?Clara Smolen D ? ? ?

## 2021-04-01 NOTE — Transfer of Care (Signed)
Immediate Anesthesia Transfer of Care Note ? ?Patient: Tiffany [name] ? ?Procedure(s) Performed: COLONOSCOPY WITH PROPOFOL ? ?Patient Location: PACU ? ?Anesthesia Type:MAC ? ?Level of Consciousness: awake, alert  and oriented ? ?Airway & Oxygen Therapy: Patient Spontanous Breathing and Patient connected to nasal cannula oxygen ? ?Post-op Assessment: Report given to RN and Post -op Vital signs reviewed and stable ? ?Post vital signs: Reviewed and stable ? ?Last Vitals:  ?Vitals Value Taken Time  ?BP 158/72   ?Temp    ?Pulse 73   ?Resp 14   ?SpO2 98%   ? ? ?Last Pain:  ?Vitals:  ? 04/01/21 1353  ?TempSrc: Oral  ?PainSc:   ?   ? ?Patients Stated Pain Goal: 0 (03/29/21 2355) ? ?Complications: No notable events documented. ?

## 2021-04-01 NOTE — Progress Notes (Signed)
?PROGRESS NOTE ? ?Tiffany [name] SWN:[id number] DOB: 08/15/1960 DOA: 03/28/2021 ?PCP: [name], MD ? ? LOS: 2 days  ? ?Brief Narrative / Interim history: ?Tiffany [name] is a 61 y.o. female with medical history significant of Anoxic brain injury s/p PEA arrest in 9381, chronic diastolic heart failure, COPD, hypertension, type 2 diabetes, seizure, PTSD/schizoaffective disorder who hospitalist is being called for rectal bleeding. She had several ED visits for LE pain, was seen by PT and was awaiting SNF placement, when she noted to have several large blood clots on her briefs. ? ?Subjective / 24h Interval events: ?Has ongoing diarrhea/ongoing bleeding overnight.  Was able to do the prep overnight and waiting on colonoscopy this morning.  [name] extremely angry this morning wanting to know her hemoglobin level stat, does not think checking it every 6 hours is enough ? ?Assesement and Plan: ?Principal Problem: ?  Acute GI bleeding ?Active Problems: ?  Anemia ?  Diabetic neuropathy ([place]) ?  COPD (chronic obstructive pulmonary disease) ([place]) ?  Essential hypertension ?  Stage 3b chronic kidney disease (CKD) ([place]) ?  Schizophrenia, schizo-affective ([place]) ?  GI bleed ?  Hyperlipidemia ? ?Assessment and Plan: ?Principal problem ?Acute lower GI bleed - Patient initially was boarding in the ED awaiting SNF placement but developed rectal bleeding and noted to have downward trending Hgb. She has documented colonoscopy and EGD for GI bleed in 2021 in outside facility in [place].  Unfortunately we do not have records of the findings.  GI consulted.  Patient is scheduled for colonoscopy this morning.  Hemoglobin overnight stable but has had more bloody bowel movements.  For unclear reasons, her hemoglobin level scheduled to be drawn at 8:00 was not drawn yet as of 9:30.  Transfuse empirically another unit of packed red blood cells ? ?Active problems ?Acute blood loss anemia -with persistent bleeding, especially more obvious  since she started the prep.  Received a total of 3 unit packed red blood cells, transfuse a fourth one now. ? ?LE neuropathy ([place]) -Has chronic lower extremity pain with increasing weakness / pain.  Continue gabapentin '600MG'$  BID. Cannot use other pain agents due to multiple significant allergies.  She was started on NSAID gel last night, discontinued at given some degree of minimal skin absorption as well as ongoing GI bleed ? ?Schizophrenia, schizo-affective ([place]) -continue home asenapine.  Continue duloxetine as well ? ?CKD 3b - Cr at baseline, 1.6-1.7 ? ?Essential hypertension -hold home medications with concern for GI blees ? ?COPD (chronic obstructive pulmonary disease) (HCC) -Stable, not in exacerbation ? ?Hyperlipidemia-continue statin ? ? ?Scheduled Meds: ? sodium chloride   Intravenous Once  ? sodium chloride   Intravenous Once  ? asenapine  10 mg Sublingual BID  ? atorvastatin  20 mg Oral Daily  ? DULoxetine  30 mg Oral Daily  ? ferrous sulfate  325 mg Oral Daily  ? gabapentin  600 mg Oral BID  ? oxybutynin  5 mg Oral BID  ? prazosin  2 mg Oral QHS  ? ?Continuous Infusions: ? sodium chloride    ? ?PRN Meds:.acetaminophen, fentaNYL (SUBLIMAZE) injection ? ?Diet Orders (From admission, onward)  ? ?  Start     Ordered  ? 04/01/21 0001  Diet NPO time specified  Diet effective midnight       ? 03/31/21 1628  ? ?  ?  ? ?  ? ? ?DVT prophylaxis: SCDs Start: 03/29/21 2013 ? ? ?Lab Results  ?Component Value Date  ?  PLT 235 04/01/2021  ? ? ?  Code Status: Full Code ? ?Family Communication: no family at bedside, updated daughter over the phone 3/16 ? ?Status is: Inpatient ? ?ABLA, GI bleed ? ? ?Level of care: Progressive ? ?Consultants:  ?GI ? ?Procedures:  ?none ? ?Microbiology  ?none ? ?Antimicrobials: ?none  ? ? ?Objective: ?Vitals:  ? 04/01/21 0440 04/01/21 0530 04/01/21 0555 04/01/21 0828  ?BP: (!) 154/93 (!) 148/90 (!) 134/102 123/71  ?Pulse: 85 83 83 90  ?Resp: '13 11 20 15  '$ ?Temp: 98 ?F (36.7 ?C)   98.2 ?F  (36.8 ?C)  ?TempSrc: Oral   Oral  ?SpO2: 99% 100% 99% 100%  ?Weight:      ?Height:      ? ? ?Intake/Output Summary (Last 24 hours) at 04/01/2021 0928 ?Last data filed at 03/31/2021 2040 ?Gross per 24 hour  ?Intake 1095 ml  ?Output 2902 ml  ?Net -1807 ml  ? ? ?Wt Readings from Last 3 Encounters:  ?03/31/21 73.9 kg  ?03/26/21 74.8 kg  ?03/19/21 72.6 kg  ? ? ?Examination: ? ?Constitutional: NAD ?Eyes: lids and conjunctivae normal, no scleral icterus ?ENMT: mmm ?Neck: normal, supple ?Respiratory: clear to auscultation bilaterally, no wheezing, no crackles. Normal respiratory effort.  ?Cardiovascular: Regular rate and rhythm, no murmurs / rubs / gallops. No LE edema. ?Abdomen: soft, no distention, no tenderness. Bowel sounds positive.  ?Skin: no rashes ?Neurologic: no focal deficits, equal strength ? ? ?Data Reviewed: I have independently reviewed following labs and imaging studies ? ?CBC ?Recent Labs  ?Lab 03/28/21 ?1843 03/29/21 ?1402 03/30/21 ?0248 03/30/21 ?2214 03/31/21 ?0002 03/31/21 ?9675 03/31/21 ?1108 03/31/21 ?2222 04/01/21 ?0256  ?WBC 6.1 4.2   < > 6.0 5.4  --  5.9 7.9 6.4  ?HGB 10.6* 9.5*   < > 7.1* 7.1* 6.9* 8.3* 9.0* 8.3*  ?HCT 35.0* 31.7*   < > 22.3* 22.4* 21.0* 25.1* 27.8* 24.9*  ?PLT 405* 387   < > 199 264  --  225 240 235  ?MCV 90.2 90.6   < > 85.8 87.2  --  85.1 86.1 85.0  ?MCH 27.3 27.1   < > 27.3 27.6  --  28.1 27.9 28.3  ?MCHC 30.3 30.0   < > 31.8 31.7  --  33.1 32.4 33.3  ?RDW 15.6* 15.6*   < > 17.4* 17.5*  --  17.2* 17.2* 16.9*  ?LYMPHSABS 2.0 1.1  --   --   --   --   --   --   --   ?MONOABS 0.7 0.4  --   --   --   --   --   --   --   ?EOSABS 0.2 0.2  --   --   --   --   --   --   --   ?BASOSABS 0.0 0.0  --   --   --   --   --   --   --   ? < > = values in this interval not displayed.  ? ? ? ?Recent Labs  ?Lab 03/28/21 ?1843 03/29/21 ?1402 03/30/21 ?0248  ?NA 139 141 137  ?K 3.9 4.0 4.0  ?CL 103 108 103  ?CO2 '27 24 27  '$ ?GLUCOSE 87 99 87  ?BUN 29* 33* 35*  ?CREATININE 2.04* 1.76* 1.64*  ?CALCIUM 9.7  8.9 8.6*  ?AST 34 27  --   ?ALT 24 19  --   ?ALKPHOS 202* 178*  --   ?BILITOT 0.4 0.3  --   ?  ALBUMIN 3.4* 3.1*  --   ?INR  --  1.2  --   ? ? ? ?------------------------------------------------------------------------------------------------------------------ ?No results for input(s): CHOL, HDL, LDLCALC, TRIG, CHOLHDL, LDLDIRECT in the last 72 hours. ? ?Lab Results  ?Component Value Date  ? HGBA1C 5.3 07/23/2020  ? ?------------------------------------------------------------------------------------------------------------------ ?No results for input(s): TSH, T4TOTAL, T3FREE, THYROIDAB in the last 72 hours. ? ?Invalid input(s): FREET3 ? ?Cardiac Enzymes ?No results for input(s): CKMB, TROPONINI, MYOGLOBIN in the last 168 hours. ? ?Invalid input(s): CK ?------------------------------------------------------------------------------------------------------------------ ?   ?Component Value Date/Time  ? BNP 27.7 03/18/2021 1010  ? ? ?CBG: ?Recent Labs  ?Lab 03/28/21 ?4765  ?GLUCAP 74  ? ? ? ?No results found for this or any previous visit (from the past 240 hour(s)).  ? ?Radiology Studies: ?No results found. ? ? ?Marzetta Board, MD, PhD ?[facility] ? ?Between 7 am - 7 pm I am available, please contact me via Amion (for emergencies) or Securechat (non urgent messages) ? ?Between 7 pm - 7 am I am not available, please contact night coverage MD/APP via Amion ? ?

## 2021-04-01 NOTE — Progress Notes (Signed)
Pt received 2 units of blood today. She refused shift assessment and posttransfusion H and H. Ask RN to leave room because she needs to rest. RN explained to pt importance of shift assessment and blood work results monitoring. Pt still refused. [name] not in distress at this time. ?[place], NP notified. ?

## 2021-04-01 NOTE — Care Management Important Message (Signed)
Important Message ? ?Patient Details  ?Name: Tiffany [name] ?MRN: [id number] ?Date of Birth: [birth year] ? ? ?Medicare Important Message Given:  Yes ? ? ? ? ?Levada Dy  Romel Dumond-Martin ?04/01/2021, 12:53 PM ?

## 2021-04-01 NOTE — Anesthesia Preprocedure Evaluation (Signed)
Anesthesia Evaluation  ?Patient identified by MRN, date of birth, ID band ?Patient awake ? ? ? ?Reviewed: ?Allergy & Precautions, NPO status , Patient's Chart, lab work & pertinent test results ? ?Airway ?Mallampati: II ? ?TM Distance: >3 FB ?Neck ROM: Full ? ? ? Dental ? ?(+) Dental Advisory Given ?  ?Pulmonary ?asthma , sleep apnea , COPD, former smoker,  ?  ?breath sounds clear to auscultation ? ? ? ? ? ? Cardiovascular ?hypertension, Pt. on medications ?+ Past MI and +CHF  ? ?Rhythm:Regular Rate:Normal ? ?12/2020 Echo. Normal EF. Valves okay ?  ?Neuro/Psych ?Seizures -,  CVA   ? GI/Hepatic ?Neg liver ROS, GERD  ,GI bleed ?  ?Endo/Other  ?diabetes, Type 2 ? Renal/GU ?Renal disease  ? ?  ?Musculoskeletal ? ?(+) Arthritis ,  ? Abdominal ?  ?Peds ? Hematology ? ?(+) Blood dyscrasia, anemia ,   ?Anesthesia Other Findings ? ? Reproductive/Obstetrics ? ?  ? ? ? ? ? ? ? ? ? ? ? ? ? ?  ?  ? ? ? ? ? ? ? ? ?Anesthesia Physical ?Anesthesia Plan ? ?ASA: 3 ? ?Anesthesia Plan: MAC  ? ?Post-op Pain Management: Minimal or no pain anticipated  ? ?Induction:  ? ?PONV Risk Score and Plan: 2 and Propofol infusion and Treatment may vary due to age or medical condition ? ?Airway Management Planned: Natural Airway and Simple Face Mask ? ?Additional Equipment:  ? ?Intra-op Plan:  ? ?Post-operative Plan:  ? ?Informed Consent: I have reviewed the patients History and Physical, chart, labs and discussed the procedure including the risks, benefits and alternatives for the proposed anesthesia with the patient or authorized representative who has indicated his/her understanding and acceptance.  ? ? ? ? ? ?Plan Discussed with: CRNA ? ?Anesthesia Plan Comments:   ? ? ? ? ? ? ?Anesthesia Quick Evaluation ? ?

## 2021-04-02 DIAGNOSIS — K921 Melena: Secondary | ICD-10-CM

## 2021-04-02 DIAGNOSIS — K922 Gastrointestinal hemorrhage, unspecified: Secondary | ICD-10-CM | POA: Diagnosis not present

## 2021-04-02 LAB — TYPE AND SCREEN
ABO/RH(D): O POS
Antibody Screen: NEGATIVE
Donor AG Type: NEGATIVE
Donor AG Type: NEGATIVE
Donor AG Type: NEGATIVE
Donor AG Type: NEGATIVE
Donor AG Type: NEGATIVE
Unit division: 0
Unit division: 0
Unit division: 0
Unit division: 0
Unit division: 0

## 2021-04-02 LAB — BPAM RBC
Blood Product Expiration Date: 202303312359
Blood Product Expiration Date: 202303312359
Blood Product Expiration Date: 202304012359
Blood Product Expiration Date: 202304022359
Blood Product Expiration Date: 202304182359
ISSUE DATE / TIME: 202303150857
ISSUE DATE / TIME: 202303160111
ISSUE DATE / TIME: 202303160702
ISSUE DATE / TIME: 202303171118
ISSUE DATE / TIME: 202303171330
Unit Type and Rh: 5100
Unit Type and Rh: 5100
Unit Type and Rh: 5100
Unit Type and Rh: 9500
Unit Type and Rh: 9500

## 2021-04-02 LAB — BASIC METABOLIC PANEL
Anion gap: 3 — ABNORMAL LOW (ref 5–15)
BUN: 16 mg/dL (ref 8–23)
CO2: 29 mmol/L (ref 22–32)
Calcium: 8.6 mg/dL — ABNORMAL LOW (ref 8.9–10.3)
Chloride: 109 mmol/L (ref 98–111)
Creatinine, Ser: 1.32 mg/dL — ABNORMAL HIGH (ref 0.44–1.00)
GFR, Estimated: 46 mL/min — ABNORMAL LOW (ref >60)
Glucose, Bld: 96 mg/dL (ref 70–99)
Potassium: 3.8 mmol/L (ref 3.5–5.1)
Sodium: 141 mmol/L (ref 135–145)

## 2021-04-02 LAB — CBC
HCT: 20.9 % — ABNORMAL LOW (ref 36.0–46.0)
Hemoglobin: 6.9 g/dL — CL (ref 12.0–15.0)
MCH: 28.6 pg (ref 26.0–34.0)
MCHC: 33 g/dL (ref 30.0–36.0)
MCV: 86.7 fL (ref 80.0–100.0)
Platelets: 175 10*3/uL (ref 150–400)
RBC: 2.41 MIL/uL — ABNORMAL LOW (ref 3.87–5.11)
RDW: 15.9 % — ABNORMAL HIGH (ref 11.5–15.5)
WBC: 6.9 10*3/uL (ref 4.0–10.5)
nRBC: 0 % (ref 0.0–0.2)

## 2021-04-02 LAB — PREPARE RBC (CROSSMATCH)

## 2021-04-02 MED ORDER — SODIUM CHLORIDE 0.9% IV SOLUTION: Freq: Once | INTRAVENOUS | Status: AC

## 2021-04-02 NOTE — Progress Notes (Signed)
Pt refused blood to be collected fro post transfusion CBC, importance of monitoring hemoglobin with GI bleed explained to pt. ?Pt verbalized understanding, and continue to refuse. ? ?

## 2021-04-02 NOTE — Progress Notes (Addendum)
? ?  Weekend coverage for Dr. Benson Norway  ?Progress Note ? ? Subjective  ?Chief Complaint: Hematochezia ? ?This morning, the patient tells me she is feeling fairly well.  She has not had a bloody bowel movement since yesterday evening.  She thinks that everything has stopped.  Denies any new complaints or concerns. ? ? Objective  ? ?Vital signs in last 24 hours: ?Temp:  [97.7 ?F (36.5 ?C)-98.5 ?F (36.9 ?C)] 98.2 ?F (36.8 ?C) (03/18 5449) ?Pulse Rate:  [85-96] 88 (03/18 0949) ?Resp:  [15-22] 19 (03/18 0949) ?BP: (125-173)/(76-100) 136/90 (03/18 0949) ?SpO2:  [98 %-100 %] 100 % (03/18 0949) ?Weight:  [73.9 kg] 73.9 kg (03/17 1200) ?Last BM Date : 04/01/21 ?General:    AA female in NAD ?Heart:  Regular rate and rhythm; no murmurs ?Lungs: Respirations even and unlabored, lungs CTA bilaterally ?Abdomen:  Soft, nontender and nondistended. Normal bowel sounds. ?Psych:  Cooperative. Normal mood and affect. ? ?Intake/Output from previous day: ?03/17 0701 - 03/18 0700 ?In: 268.8 [Blood:268.8] ?Out: -  ? ? ?Lab Results: ?Recent Labs  ?  04/01/21 ?0256 04/01/21 ?0953 04/02/21 ?0929  ?WBC 6.4 6.7 6.9  ?HGB 8.3* 6.4* 6.9*  ?HCT 24.9* 20.1* 20.9*  ?PLT 235 220 175  ? ?BMET ?Recent Labs  ?  04/02/21 ?0929  ?NA 141  ?K 3.8  ?CL 109  ?CO2 29  ?GLUCOSE 96  ?BUN 16  ?CREATININE 1.32*  ?CALCIUM 8.6*  ? ? Assessment / Plan:   ?Assessment: ?1.  Hematochezia: Hemoglobin 8.3--> 6.4--> 1 unit PRBCs--> 6.9 overnight, no further hematochezia per the patient, status post colonoscopy yesterday with blood in the colon but no source found ?2.  Anemia: With above ? ?Plan: ?1.  Again, if patient starts with more brisk bleeding would repeat CTA ?2.  For now patient would benefit from another unit of PRBCs and continued monitoring of her hemoglobin and transfusion as needed less than 6 ?3.  Will allow for low fiber diet today ? ?We will not plan to see the patient tomorrow, Dr. Benson Norway will see again on Monday.  Please call with any acute concerns over the  weekend. ? ? LOS: 3 days  ? ?Lavone Nian [facility]  04/02/2021, 11:58 AM ? ?  ? Attending physician's note  ? ?I have taken a history, reviewed the chart and examined the patient. I performed a substantive portion of this encounter, including complete performance of at least one of the key components, in conjunction with the APP. I agree with the APP's note, impression and recommendations.  ?  ?No further bleeding since yesterday ? ?Hemoglobin 6.9, receiving 1 unit PRBC ? ?Plan for nuclear med bleeding scan if develops recurrent episode of hematochezia or CT angio if develops of brisk episode of lower GI hemorrhage ? ?Advance diet to soft low residue ? ?Please call with any questions, Dr. Benson Norway will resume care on Monday ? ?The patient was provided an opportunity to ask questions and all were answered. The patient agreed with the plan and demonstrated an understanding of the instructions. ? ? ?K. Denzil Magnuson , MD ?[phone]   ? ?

## 2021-04-02 NOTE — Progress Notes (Signed)
?PROGRESS NOTE ? ?Tiffany [name] BSJ:[id number] DOB: 07/25/1960 DOA: 03/28/2021 ?PCP: [name], MD ? ? LOS: 3 days  ? ?Brief Narrative / Interim history: ?Tiffany [name] is a 61 y.o. female with medical history significant of Anoxic brain injury s/p PEA arrest in 7654, chronic diastolic heart failure, COPD, hypertension, type 2 diabetes, seizure, PTSD/schizoaffective disorder who hospitalist is being called for rectal bleeding. She had several ED visits for LE pain, was seen by PT and was awaiting SNF placement, when she noted to have several large blood clots on her briefs. ? ?Subjective / 24h Interval events: ?1 episode of bleeding overnight, a lot less than the night before.  She denies any chest pain, no shortness of breath ? ?Assesement and Plan: ?Principal Problem: ?  Acute GI bleeding ?Active Problems: ?  Anemia ?  Diabetic neuropathy ([place]) ?  COPD (chronic obstructive pulmonary disease) ([place]) ?  Essential hypertension ?  Stage 3b chronic kidney disease (CKD) ([place]) ?  Schizophrenia, schizo-affective ([place]) ?  GI bleed ?  Hyperlipidemia ? ?Assessment and Plan: ?Principal problem ?Acute lower GI bleed - Patient initially was boarding in the ED awaiting SNF placement but developed rectal bleeding and noted to have downward trending Hgb. She has documented colonoscopy and EGD for GI bleed in 2021 in outside facility in [place].  Unfortunately we do not have records of the findings.  GI consulted.  She underwent colonoscopy on 3/17 without a clear source of bleeding found, but there was blood throughout the entire colon.  She received 5 units of blood so far, hemoglobin this morning still low at 6.9.  Transfuse 2 additional units today ? ?Active problems ?Acute blood loss anemia -bleeding seems to be slowing down.  She will have received a total of 7 units so far including the ones today ? ?LE neuropathy ([place]) -Has chronic lower extremity pain with increasing weakness / pain.  Continue gabapentin '600MG'$  BID.  Cannot use other pain agents due to multiple significant allergies.  She was started on NSAID gel 2 nights ago, discontinued at given some degree of minimal skin absorption as well as ongoing GI bleed ? ?Schizophrenia, schizo-affective ([place]) -continue home asenapine.  Continue duloxetine as well ? ?CKD 3b - Cr at baseline, 1.6-1.7 ? ?Essential hypertension -hold home medications with concern for GI blees ? ?COPD (chronic obstructive pulmonary disease) (HCC) -Stable, not in exacerbation ? ?Hyperlipidemia-continue statin ? ? ?Scheduled Meds: ? sodium chloride   Intravenous Once  ? asenapine  10 mg Sublingual BID  ? atorvastatin  20 mg Oral Daily  ? DULoxetine  30 mg Oral Daily  ? ferrous sulfate  325 mg Oral Daily  ? gabapentin  600 mg Oral BID  ? oxybutynin  5 mg Oral BID  ? prazosin  2 mg Oral QHS  ? ?Continuous Infusions: ? ?PRN Meds:.acetaminophen, fentaNYL (SUBLIMAZE) injection ? ?Diet Orders (From admission, onward)  ? ?  Start     Ordered  ? 04/01/21 1406  Diet clear liquid Room service appropriate? Yes; Fluid consistency: Thin  Diet effective now       ?Question Answer Comment  ?Room service appropriate? Yes   ?Fluid consistency: Thin   ?  ? 04/01/21 1405  ? ?  ?  ? ?  ? ? ?DVT prophylaxis: SCDs Start: 03/29/21 2013 ? ? ?Lab Results  ?Component Value Date  ? PLT 175 04/02/2021  ? ? ?  Code Status: Full Code ? ?Family Communication: no family at bedside, updated daughter over  the phone ? ?Status is: Inpatient ? ?ABLA, GI bleed ? ? ?Level of care: Progressive ? ?Consultants:  ?GI ? ?Procedures:  ?none ? ?Microbiology  ?none ? ?Antimicrobials: ?none  ? ? ?Objective: ?Vitals:  ? 04/01/21 1730 04/01/21 2116 04/02/21 0416 04/02/21 0949  ?BP: (!) 173/76 137/83 135/80 136/90  ?Pulse: 89 89 85 88  ?Resp: 19   19  ?Temp: 98.2 ?F (36.8 ?C) 98.3 ?F (36.8 ?C) 98.5 ?F (36.9 ?C) 98.2 ?F (36.8 ?C)  ?TempSrc: Oral Oral Oral Oral  ?SpO2:  98% 100% 100%  ?Weight:      ?Height:      ? ? ?Intake/Output Summary (Last 24 hours) at  04/02/2021 1127 ?Last data filed at 04/01/2021 1345 ?Gross per 24 hour  ?Intake 268.75 ml  ?Output --  ?Net 268.75 ml  ? ? ?Wt Readings from Last 3 Encounters:  ?04/01/21 73.9 kg  ?03/26/21 74.8 kg  ?03/19/21 72.6 kg  ? ? ?Examination: ? ?Constitutional: NAD ?Eyes: lids and conjunctivae normal, no scleral icterus ?ENMT: mmm ?Neck: normal, supple ?Respiratory: clear to auscultation bilaterally, no wheezing, no crackles. Normal respiratory effort.  ?Cardiovascular: Regular rate and rhythm, no murmurs / rubs / gallops. No LE edema. ?Abdomen: soft, no distention, no tenderness. Bowel sounds positive.  ?Skin: no rashes ?Neurologic: no focal deficits, equal strength ? ? ?Data Reviewed: I have independently reviewed following labs and imaging studies ? ?CBC ?Recent Labs  ?Lab 03/28/21 ?1843 03/29/21 ?1402 03/30/21 ?1751 03/31/21 ?1108 03/31/21 ?2222 04/01/21 ?0258 04/01/21 ?5277 04/02/21 ?8242  ?WBC 6.1 4.2   < > 5.9 7.9 6.4 6.7 6.9  ?HGB 10.6* 9.5*   < > 8.3* 9.0* 8.3* 6.4* 6.9*  ?HCT 35.0* 31.7*   < > 25.1* 27.8* 24.9* 20.1* 20.9*  ?PLT 405* 387   < > 225 240 235 220 175  ?MCV 90.2 90.6   < > 85.1 86.1 85.0 87.0 86.7  ?MCH 27.3 27.1   < > 28.1 27.9 28.3 27.7 28.6  ?MCHC 30.3 30.0   < > 33.1 32.4 33.3 31.8 33.0  ?RDW 15.6* 15.6*   < > 17.2* 17.2* 16.9* 16.9* 15.9*  ?LYMPHSABS 2.0 1.1  --   --   --   --   --   --   ?MONOABS 0.7 0.4  --   --   --   --   --   --   ?EOSABS 0.2 0.2  --   --   --   --   --   --   ?BASOSABS 0.0 0.0  --   --   --   --   --   --   ? < > = values in this interval not displayed.  ? ? ? ?Recent Labs  ?Lab 03/28/21 ?1843 03/29/21 ?1402 03/30/21 ?0248 04/02/21 ?3536  ?NA 139 141 137 141  ?K 3.9 4.0 4.0 3.8  ?CL 103 108 103 109  ?CO2 '27 24 27 29  '$ ?GLUCOSE 87 99 87 96  ?BUN 29* 33* 35* 16  ?CREATININE 2.04* 1.76* 1.64* 1.32*  ?CALCIUM 9.7 8.9 8.6* 8.6*  ?AST 34 27  --   --   ?ALT 24 19  --   --   ?ALKPHOS 202* 178*  --   --   ?BILITOT 0.4 0.3  --   --   ?ALBUMIN 3.4* 3.1*  --   --   ?INR  --  1.2  --   --    ? ? ? ?------------------------------------------------------------------------------------------------------------------ ?No results for  input(s): CHOL, HDL, LDLCALC, TRIG, CHOLHDL, LDLDIRECT in the last 72 hours. ? ?Lab Results  ?Component Value Date  ? HGBA1C 5.3 07/23/2020  ? ?------------------------------------------------------------------------------------------------------------------ ?No results for input(s): TSH, T4TOTAL, T3FREE, THYROIDAB in the last 72 hours. ? ?Invalid input(s): FREET3 ? ?Cardiac Enzymes ?No results for input(s): CKMB, TROPONINI, MYOGLOBIN in the last 168 hours. ? ?Invalid input(s): CK ?------------------------------------------------------------------------------------------------------------------ ?   ?Component Value Date/Time  ? BNP 27.7 03/18/2021 1010  ? ? ?CBG: ?Recent Labs  ?Lab 03/28/21 ?1854 04/01/21 ?1351  ?GLUCAP 74 93  ? ? ? ?No results found for this or any previous visit (from the past 240 hour(s)).  ? ?Radiology Studies: ?No results found. ? ? ?Marzetta Board, MD, PhD ?[facility] ? ?Between 7 am - 7 pm I am available, please contact me via Amion (for emergencies) or Securechat (non urgent messages) ? ?Between 7 pm - 7 am I am not available, please contact night coverage MD/APP via Amion ? ?

## 2021-04-03 DIAGNOSIS — K922 Gastrointestinal hemorrhage, unspecified: Secondary | ICD-10-CM | POA: Diagnosis not present

## 2021-04-03 LAB — TYPE AND SCREEN
ABO/RH(D): O POS
Antibody Screen: NEGATIVE
Donor AG Type: NEGATIVE
Donor AG Type: NEGATIVE
Unit division: 0
Unit division: 0

## 2021-04-03 LAB — CBC
HCT: 27.1 % — ABNORMAL LOW (ref 36.0–46.0)
HCT: 32.2 % — ABNORMAL LOW (ref 36.0–46.0)
Hemoglobin: 9.2 g/dL — ABNORMAL LOW (ref 12.0–15.0)
Hemoglobin: 10.7 g/dL — ABNORMAL LOW (ref 12.0–15.0)
MCH: 30.2 pg (ref 26.0–34.0)
MCH: 30.1 pg (ref 26.0–34.0)
MCHC: 33.9 g/dL (ref 30.0–36.0)
MCHC: 33.2 g/dL (ref 30.0–36.0)
MCV: 88.9 fL (ref 80.0–100.0)
MCV: 90.7 fL (ref 80.0–100.0)
Platelets: 194 10*3/uL (ref 150–400)
Platelets: 132 10*3/uL — ABNORMAL LOW (ref 150–400)
RBC: 3.05 MIL/uL — ABNORMAL LOW (ref 3.87–5.11)
RBC: 3.55 MIL/uL — ABNORMAL LOW (ref 3.87–5.11)
RDW: 15.4 % (ref 11.5–15.5)
RDW: 15.7 % — ABNORMAL HIGH (ref 11.5–15.5)
WBC: 8.7 10*3/uL (ref 4.0–10.5)
WBC: 11 10*3/uL — ABNORMAL HIGH (ref 4.0–10.5)
nRBC: 0 % (ref 0.0–0.2)
nRBC: 0 % (ref 0.0–0.2)

## 2021-04-03 LAB — BPAM RBC
Blood Product Expiration Date: 202304202359
Blood Product Expiration Date: 202304212359
ISSUE DATE / TIME: 202303181157
ISSUE DATE / TIME: 202303181515
Unit Type and Rh: 5100
Unit Type and Rh: 5100

## 2021-04-03 MED ORDER — DIAZEPAM 5 MG PO TABS
5.0000 mg | ORAL_TABLET | Freq: Every day | ORAL | Status: DC | PRN
  Administered 2021-04-03 – 2021-04-05 (×3): 5 mg via ORAL
  Filled 2021-04-03 (×3): qty 1

## 2021-04-03 MED ORDER — AMLODIPINE BESYLATE 10 MG PO TABS
10.0000 mg | ORAL_TABLET | Freq: Every day | ORAL | Status: DC
  Administered 2021-04-03 – 2021-04-06 (×4): 10 mg via ORAL
  Filled 2021-04-03 (×4): qty 1

## 2021-04-03 NOTE — Progress Notes (Deleted)
Patient hospitalized

## 2021-04-03 NOTE — Progress Notes (Signed)
?PROGRESS NOTE ? ?Kenijah Benningfield Bouchie SPQ:[id number] DOB: May 07, 1960 DOA: 03/28/2021 ?PCP: [name], MD ? ? LOS: 4 days  ? ?Brief Narrative / Interim history: ?OLINDA NOLA is a 61 y.o. female with medical history significant of Anoxic brain injury s/p PEA arrest in 3335, chronic diastolic heart failure, COPD, hypertension, type 2 diabetes, seizure, PTSD/schizoaffective disorder who hospitalist is being called for rectal bleeding. She had several ED visits for LE pain, was seen by PT and was awaiting SNF placement, when she noted to have several large blood clots on her briefs. ? ?Subjective / 24h Interval events: ?No further episodes of bright red blood per rectum.  She has some darker stools.  Overall she is doing well this morning, no abdominal pain, no nausea or vomiting. ? ?Assesement and Plan: ?Principal Problem: ?  Acute GI bleeding ?Active Problems: ?  Anemia ?  Diabetic neuropathy ([place]) ?  COPD (chronic obstructive pulmonary disease) ([place]) ?  Essential hypertension ?  Stage 3b chronic kidney disease (CKD) ([place]) ?  Schizophrenia, schizo-affective ([place]) ?  GI bleed ?  Hyperlipidemia ?  Hematochezia ? ?Assessment and Plan: ?Principal problem ?Acute lower GI bleed - Patient initially was boarding in the ED awaiting SNF placement but developed rectal bleeding and noted to have downward trending Hgb. She has documented colonoscopy and EGD for GI bleed in 2021 in outside facility in [place].  Unfortunately we do not have records of the findings.  GI consulted.  She underwent colonoscopy on 3/17 without a clear source of bleeding found, but there was blood throughout the entire colon.  She received a total of 7 units of packed red blood cells so far. ? ?Active problems ?Acute blood loss anemia -bleeding seems to be slowing down.  She will have received a total of 7 units so far including the ones today hemoglobin stable, 9.2 today.  Continue to monitor ? ?LE neuropathy ([place]) -Has chronic lower extremity pain  with increasing weakness / pain.  Continue gabapentin '600MG'$  BID. Cannot use other pain agents due to multiple significant allergies.  She was started on NSAID gel 2 nights ago, discontinued at given some degree of minimal skin absorption as well as ongoing GI bleed ? ?Schizophrenia, schizo-affective ([place]) -continue home asenapine.  Continue duloxetine as well ? ?CKD 3b -creatinine a little bit better than baseline, 1.3.  Recheck tomorrow morning ? ?Essential hypertension -hold home medications with concern for GI bleed ? ?COPD (chronic obstructive pulmonary disease) (HCC) -Stable, not in exacerbation ? ?Hyperlipidemia-continue statin ? ? ?Scheduled Meds: ? sodium chloride   Intravenous Once  ? asenapine  10 mg Sublingual BID  ? atorvastatin  20 mg Oral Daily  ? DULoxetine  30 mg Oral Daily  ? ferrous sulfate  325 mg Oral Daily  ? gabapentin  600 mg Oral BID  ? oxybutynin  5 mg Oral BID  ? prazosin  2 mg Oral QHS  ? ?Continuous Infusions: ? ?PRN Meds:.acetaminophen, fentaNYL (SUBLIMAZE) injection ? ?Diet Orders (From admission, onward)  ? ?  Start     Ordered  ? 04/02/21 1204  DIET SOFT Room service appropriate? Yes; Fluid consistency: Thin  Diet effective now       ?Question Answer Comment  ?Room service appropriate? Yes   ?Fluid consistency: Thin   ?  ? 04/02/21 1203  ? ?  ?  ? ?  ? ? ?DVT prophylaxis: SCDs Start: 03/29/21 2013 ? ? ?Lab Results  ?Component Value Date  ? PLT 194 04/03/2021  ? ? ?  Code Status: Full Code ? ?Family Communication: no family at bedside ? ?Status is: Inpatient ? ?ABLA, GI bleed ? ? ?Level of care: Progressive ? ?Consultants:  ?GI ? ?Procedures:  ?none ? ?Microbiology  ?none ? ?Antimicrobials: ?none  ? ? ?Objective: ?Vitals:  ? 04/02/21 2100 04/02/21 2200 04/03/21 0826 04/03/21 0828  ?BP:    (!) 172/95  ?Pulse: 95 94  88  ?Resp: '20 20  19  '$ ?Temp:   98 ?F (36.7 ?C)   ?TempSrc:   Oral   ?SpO2: 97% 96%  96%  ?Weight:      ?Height:      ? ?No intake or output data in the 24 hours ending  04/03/21 1047 ? ?Wt Readings from Last 3 Encounters:  ?04/01/21 73.9 kg  ?03/26/21 74.8 kg  ?03/19/21 72.6 kg  ? ? ?Examination: ? ?Constitutional: NAD ?Eyes: lids and conjunctivae normal, no scleral icterus ?ENMT: mmm ?Neck: normal, supple ?Respiratory: clear to auscultation bilaterally, no wheezing, no crackles. Normal respiratory effort.  ?Cardiovascular: Regular rate and rhythm, no murmurs / rubs / gallops. No LE edema. ?Abdomen: soft, no distention, no tenderness. Bowel sounds positive.  ?Skin: no rashes ?Neurologic: no focal deficits, equal strength ? ? ?Data Reviewed: I have independently reviewed following labs and imaging studies ? ?CBC ?Recent Labs  ?Lab 03/28/21 ?1843 03/29/21 ?1402 03/30/21 ?3267 03/31/21 ?2222 04/01/21 ?1245 04/01/21 ?8099 04/02/21 ?8338 04/03/21 ?0231  ?WBC 6.1 4.2   < > 7.9 6.4 6.7 6.9 8.7  ?HGB 10.6* 9.5*   < > 9.0* 8.3* 6.4* 6.9* 9.2*  ?HCT 35.0* 31.7*   < > 27.8* 24.9* 20.1* 20.9* 27.1*  ?PLT 405* 387   < > 240 235 220 175 194  ?MCV 90.2 90.6   < > 86.1 85.0 87.0 86.7 88.9  ?MCH 27.3 27.1   < > 27.9 28.3 27.7 28.6 30.2  ?MCHC 30.3 30.0   < > 32.4 33.3 31.8 33.0 33.9  ?RDW 15.6* 15.6*   < > 17.2* 16.9* 16.9* 15.9* 15.4  ?LYMPHSABS 2.0 1.1  --   --   --   --   --   --   ?MONOABS 0.7 0.4  --   --   --   --   --   --   ?EOSABS 0.2 0.2  --   --   --   --   --   --   ?BASOSABS 0.0 0.0  --   --   --   --   --   --   ? < > = values in this interval not displayed.  ? ? ? ?Recent Labs  ?Lab 03/28/21 ?1843 03/29/21 ?1402 03/30/21 ?0248 04/02/21 ?2505  ?NA 139 141 137 141  ?K 3.9 4.0 4.0 3.8  ?CL 103 108 103 109  ?CO2 '27 24 27 29  '$ ?GLUCOSE 87 99 87 96  ?BUN 29* 33* 35* 16  ?CREATININE 2.04* 1.76* 1.64* 1.32*  ?CALCIUM 9.7 8.9 8.6* 8.6*  ?AST 34 27  --   --   ?ALT 24 19  --   --   ?ALKPHOS 202* 178*  --   --   ?BILITOT 0.4 0.3  --   --   ?ALBUMIN 3.4* 3.1*  --   --   ?INR  --  1.2  --   --    ? ? ? ?------------------------------------------------------------------------------------------------------------------ ?No results for input(s): CHOL, HDL, LDLCALC, TRIG, CHOLHDL, LDLDIRECT in the last 72 hours. ? ?Lab Results  ?Component Value Date  ?  HGBA1C 5.3 07/23/2020  ? ?------------------------------------------------------------------------------------------------------------------ ?No results for input(s): TSH, T4TOTAL, T3FREE, THYROIDAB in the last 72 hours. ? ?Invalid input(s): FREET3 ? ?Cardiac Enzymes ?No results for input(s): CKMB, TROPONINI, MYOGLOBIN in the last 168 hours. ? ?Invalid input(s): CK ?------------------------------------------------------------------------------------------------------------------ ?   ?Component Value Date/Time  ? BNP 27.7 03/18/2021 1010  ? ? ?CBG: ?Recent Labs  ?Lab 03/28/21 ?1854 04/01/21 ?1351  ?GLUCAP 74 93  ? ? ? ?No results found for this or any previous visit (from the past 240 hour(s)).  ? ?Radiology Studies: ?No results found. ? ? ?Marzetta Board, MD, PhD ?[facility] ? ?Between 7 am - 7 pm I am available, please contact me via Amion (for emergencies) or Securechat (non urgent messages) ? ?Between 7 pm - 7 am I am not available, please contact night coverage MD/APP via Amion ? ?

## 2021-04-04 ENCOUNTER — Telehealth: Payer: Self-pay

## 2021-04-04 ENCOUNTER — Encounter (HOSPITAL_COMMUNITY): Payer: Self-pay | Admitting: Gastroenterology

## 2021-04-04 ENCOUNTER — Ambulatory Visit: Payer: Medicare Other | Admitting: Internal Medicine

## 2021-04-04 DIAGNOSIS — K922 Gastrointestinal hemorrhage, unspecified: Secondary | ICD-10-CM | POA: Diagnosis not present

## 2021-04-04 DIAGNOSIS — K921 Melena: Secondary | ICD-10-CM | POA: Diagnosis not present

## 2021-04-04 LAB — CBC
HCT: 30.7 % — ABNORMAL LOW (ref 36.0–46.0)
HCT: 32.8 % — ABNORMAL LOW (ref 36.0–46.0)
Hemoglobin: 10.2 g/dL — ABNORMAL LOW (ref 12.0–15.0)
Hemoglobin: 11 g/dL — ABNORMAL LOW (ref 12.0–15.0)
MCH: 29.9 pg (ref 26.0–34.0)
MCH: 30.4 pg (ref 26.0–34.0)
MCHC: 33.2 g/dL (ref 30.0–36.0)
MCHC: 33.5 g/dL (ref 30.0–36.0)
MCV: 90 fL (ref 80.0–100.0)
MCV: 90.6 fL (ref 80.0–100.0)
Platelets: 227 10*3/uL (ref 150–400)
Platelets: 266 10*3/uL (ref 150–400)
RBC: 3.41 MIL/uL — ABNORMAL LOW (ref 3.87–5.11)
RBC: 3.62 MIL/uL — ABNORMAL LOW (ref 3.87–5.11)
RDW: 15.4 % (ref 11.5–15.5)
RDW: 15.7 % — ABNORMAL HIGH (ref 11.5–15.5)
WBC: 10.7 10*3/uL — ABNORMAL HIGH (ref 4.0–10.5)
WBC: 11.6 10*3/uL — ABNORMAL HIGH (ref 4.0–10.5)
nRBC: 0 % (ref 0.0–0.2)
nRBC: 0 % (ref 0.0–0.2)

## 2021-04-04 LAB — BASIC METABOLIC PANEL
Anion gap: 9 (ref 5–15)
BUN: 15 mg/dL (ref 8–23)
CO2: 28 mmol/L (ref 22–32)
Calcium: 9.4 mg/dL (ref 8.9–10.3)
Chloride: 104 mmol/L (ref 98–111)
Creatinine, Ser: 1.47 mg/dL — ABNORMAL HIGH (ref 0.44–1.00)
GFR, Estimated: 40 mL/min — ABNORMAL LOW (ref >60)
Glucose, Bld: 92 mg/dL (ref 70–99)
Potassium: 3.3 mmol/L — ABNORMAL LOW (ref 3.5–5.1)
Sodium: 141 mmol/L (ref 135–145)

## 2021-04-04 MED ORDER — OXYCODONE-ACETAMINOPHEN 5-325 MG PO TABS
1.0000 | ORAL_TABLET | Freq: Four times a day (QID) | ORAL | Status: DC | PRN
  Administered 2021-04-04 – 2021-04-06 (×3): 1 via ORAL
  Filled 2021-04-04 (×3): qty 1

## 2021-04-04 MED ORDER — MELATONIN 5 MG PO TABS
5.0000 mg | ORAL_TABLET | Freq: Once | ORAL | Status: AC
  Administered 2021-04-04: 5 mg via ORAL
  Filled 2021-04-04: qty 1

## 2021-04-04 MED ORDER — TORSEMIDE 20 MG PO TABS
20.0000 mg | ORAL_TABLET | Freq: Every day | ORAL | Status: DC
  Administered 2021-04-04 – 2021-04-06 (×3): 20 mg via ORAL
  Filled 2021-04-04 (×3): qty 1

## 2021-04-04 MED ORDER — HYDROXYZINE HCL 25 MG PO TABS
25.0000 mg | ORAL_TABLET | Freq: Three times a day (TID) | ORAL | Status: DC | PRN
  Administered 2021-04-04: 25 mg via ORAL
  Filled 2021-04-04: qty 1

## 2021-04-04 MED ORDER — POTASSIUM CHLORIDE CRYS ER 20 MEQ PO TBCR
40.0000 meq | EXTENDED_RELEASE_TABLET | Freq: Once | ORAL | Status: AC
  Administered 2021-04-04: 40 meq via ORAL
  Filled 2021-04-04: qty 2

## 2021-04-04 NOTE — Progress Notes (Deleted)
GYNECOLOGY  VISIT   HPI: 61 y.o.   Widowed Black or [place] American Not Hispanic or Latino  female   [phone] with No LMP recorded. Patient has had a hysterectomy.   here for     GYNECOLOGIC HISTORY: No LMP recorded. Patient has had a hysterectomy. Contraception:PMP Menopausal hormone therapy: None        OB History     Gravida  4   Para  4   Term  3   Preterm  1   AB  0   Living  3      SAB  0   IAB  0   Ectopic  0   Multiple  0   Live Births  3              Patient Active Problem List   Diagnosis Date Noted   Hematochezia    Hyperlipidemia 03/31/2021   GI bleed 03/30/2021   Acute GI bleeding 03/29/2021   Atypical chest pain 01/13/2021   GERD (gastroesophageal reflux disease)    Neuropathy 11/18/2020   Pain in both feet 11/18/2020   Localized edema 11/18/2020   Encounter for medication management 11/17/2020   Protein-calorie malnutrition, severe 05/15/2020   Malnutrition of moderate degree 05/13/2020   Periumbilical abdominal pain    Dysphagia    Nausea vomiting and diarrhea    Generalized abdominal pain    E. coli UTI 02/20/2020   Acute metabolic encephalopathy 02/20/2020   Psychosis (HCC) 02/20/2020   Schizophrenia, schizo-affective (HCC) 02/20/2020   Metabolic acidosis, normal anion gap (NAG) 02/20/2020   Altered mental status    Hypocalcemia    AKI (acute kidney injury) (HCC) 02/18/2020   Pelvic mass in female 01/02/2020   Genital herpes 11/25/2019   Stage 2 skin ulcer of sacral region (HCC) 10/28/2019   ARF (acute renal failure) (HCC) 10/27/2019   Family discord 02/04/2019   Aggressive behavior    PTSD (post-traumatic stress disorder) 05/27/2018   Schizoaffective disorder, bipolar type (HCC) 04/05/2018   Closed displaced fracture of fifth metacarpal bone 03/21/2018   Difficulty with speech 01/24/2018   Encephalopathy 11/21/2017   Drug abuse (HCC) 11/21/2017   Frequent falls 10/11/2017   Binge eating disorder    Dependence on  continuous supplemental oxygen 05/14/2017   Gout 04/11/2017   Hypomagnesemia    Stage 3b chronic kidney disease (CKD) (HCC) 12/15/2016   Carotid artery stenosis    Hypokalemia    Osteoarthritis 10/26/2016   Anoxic brain injury (HCC) 09/08/2016   Overactive bladder 06/07/2016   QT prolongation    OSA and COPD overlap syndrome (HCC)    Arthritis    Essential hypertension 03/22/2016   Cocaine use disorder, severe, in sustained remission (HCC) 12/17/2015   History of drug abuse in remission (HCC) 11/28/2015   Hyponatremia 11/25/2015   Chronic diastolic congestive heart failure (HCC)    Chronic respiratory failure with hypoxia (HCC) 06/22/2015   Tobacco use disorder 07/22/2014   COPD (chronic obstructive pulmonary disease) (HCC) 07/08/2014   Seizure (HCC) 01/04/2013   Chronic pain syndrome 06/18/2012   Dyslipidemia 04/24/2011   Anemia 04/24/2011   Diabetic neuropathy (HCC) 04/24/2011   Morbid obesity (HCC) 10/18/2010   Type 2 diabetes mellitus (HCC) 10/18/2010    Past Medical History:  Diagnosis Date   Agitation 11/22/2017   Anoxic brain injury (HCC) 09/08/2016   C. Arrest due to respiratory failure and COPD exacerbation   Anxiety    Arthritis    "all over" (04/10/2016)  Asthma 10/18/2010   Binge eating disorder    Cardiac arrest (HCC) 09/08/2016   PEA   Carotid artery stenosis    1-39% bilateral by dopplers 11/2016   Chronic diastolic (congestive) heart failure (HCC)    Chronic kidney disease, stage 3 (HCC)    Chronic pain syndrome 06/18/2012   Chronic post-traumatic stress disorder (PTSD) 05/27/2018   Chronic respiratory failure with hypoxia and hypercapnia (HCC) 06/22/2015   TRILOGY Vent >AVAPA-ES., Vt target 200-400, Max P 30 , PS max 20 , PS min 6-10 , E Max 6, E Min 4, Rate Auto AVAPS Rate 2 (titrate for pt comfort) , bleed O2 at 5l/m continuous flow .    Closed displaced fracture of fifth metacarpal bone 03/21/2018   Cocaine use disorder, severe, in sustained  remission (HCC) 12/17/2015   Complication of anesthesia    decreased bp, decreased heart rate   COPD (chronic obstructive pulmonary disease) (HCC) 07/08/2014   Depression    Diabetic neuropathy (HCC) 04/24/2011   Difficulty with speech 01/24/2018   Disorder of nervous system    Drug abuse (HCC) 11/21/2017   Dyslipidemia 04/24/2011   Elevated troponin 04/28/2012   Emphysema    Encephalopathy 11/21/2017   Essential hypertension 03/22/2016   Fibula fracture 07/10/2016   Frequent falls 10/11/2017   GERD (gastroesophageal reflux disease)    Gout 04/11/2017   Heart attack (HCC) 1980s   History of blood transfusion 1994   "couldn't stop bleeding from my period"   History of drug abuse in remission (HCC) 11/28/2015   Quit in 2017   Hyperlipidemia LDL goal <70    Incontinence    Manic depression (HCC)    Morbid obesity (HCC) 10/18/2010   Obstructive sleep apnea 10/18/2010   On home oxygen therapy    "6L; 24/7" (04/10/2016)   OSA on CPAP    "wear mask sometimes" (04/10/2016)   Paranoid (HCC)    "sometimes; I'm on RX for it" (04/10/2016)   Prolonged Q-T interval on ECG    Rectal bleeding 12/31/2015   Schizoaffective disorder, bipolar type (HCC) 04/05/2018   Seasonal allergies    Seborrheic keratoses 12/31/2013   Seizures (HCC)    "don't know what kind; last one was ~ 1 yr ago" (04/10/2016)   Sinus bradycardia    Stroke [facility]) 1980s   denies residual on 04/10/2016   Thrush 09/19/2013   Type 2 diabetes mellitus (HCC) 10/18/2010    Past Surgical History:  Procedure Laterality Date   CESAREAN SECTION  1997   COLONOSCOPY WITH PROPOFOL N/A 04/01/2021   Procedure: COLONOSCOPY WITH PROPOFOL;  Surgeon: Jeani Hawking, MD;  Location: [facility] ENDOSCOPY;  Service: Gastroenterology;  Laterality: N/A;  Rectal bleeding with drop in hemoglobin to 7.2 g/dL   HERNIA REPAIR     IR CHOLANGIOGRAM EXISTING TUBE  07/20/2016   IR PERC CHOLECYSTOSTOMY  05/10/2016   IR RADIOLOGIST EVAL & MGMT  06/08/2016   IR  RADIOLOGIST EVAL & MGMT  06/29/2016   IR SINUS/FIST TUBE CHK-NON GI  07/12/2016   RIGHT/LEFT HEART CATH AND CORONARY ANGIOGRAPHY N/A 06/19/2017   Procedure: RIGHT/LEFT HEART CATH AND CORONARY ANGIOGRAPHY;  Surgeon: Dolores Patty, MD;  Location: [facility] INVASIVE CV LAB;  Service: Cardiovascular;  Laterality: N/A;   TIBIA IM NAIL INSERTION Right 07/12/2016   Procedure: INTRAMEDULLARY (IM) NAIL RIGHT TIBIA;  Surgeon: Tarry Kos, MD;  Location: [facility] OR;  Service: Orthopedics;  Laterality: Right;   UMBILICAL HERNIA REPAIR  ~ 1963   "that's why I don't  have a belly button"   VAGINAL HYSTERECTOMY      No current facility-administered medications for this visit.   No current outpatient medications on file.   Facility-Administered Medications Ordered in Other Visits  Medication Dose Route Frequency Provider Last Rate Last Admin   0.9 %  sodium chloride infusion (Manually program via Guardrails IV Fluids)   Intravenous Once Jeani Hawking, MD       acetaminophen (TYLENOL) tablet 650 mg  650 mg Oral Q6H PRN Jeani Hawking, MD   650 mg at 04/03/21 1802   amLODipine (NORVASC) tablet 10 mg  10 mg Oral Daily Pamella Pert M, MD   10 mg at 04/04/21 0800   asenapine (SAPHRIS) sublingual tablet 10 mg  10 mg Sublingual BID Jeani Hawking, MD   10 mg at 04/04/21 0831   atorvastatin (LIPITOR) tablet 20 mg  20 mg Oral Daily Jeani Hawking, MD   20 mg at 04/04/21 0756   diazepam (VALIUM) tablet 5 mg  5 mg Oral Daily PRN Leatha Gilding, MD   5 mg at 04/03/21 2019   DULoxetine (CYMBALTA) DR capsule 30 mg  30 mg Oral Daily Jeani Hawking, MD   30 mg at 04/04/21 0756   fentaNYL (SUBLIMAZE) injection 25 mcg  25 mcg Intravenous Q6H PRN Jeani Hawking, MD   25 mcg at 04/02/21 0107   ferrous sulfate tablet 325 mg  325 mg Oral Daily Jeani Hawking, MD   325 mg at 04/04/21 0757   gabapentin (NEURONTIN) capsule 600 mg  600 mg Oral BID Jeani Hawking, MD   600 mg at 04/04/21 0757   hydrOXYzine (ATARAX) tablet 25 mg  25 mg Oral  TID PRN Leatha Gilding, MD       oxybutynin (DITROPAN) tablet 5 mg  5 mg Oral BID Jeani Hawking, MD   5 mg at 04/04/21 0830   oxyCODONE-acetaminophen (PERCOCET/ROXICET) 5-325 MG per tablet 1 tablet  1 tablet Oral Q6H PRN Leatha Gilding, MD       prazosin (MINIPRESS) capsule 2 mg  2 mg Oral QHS Jeani Hawking, MD   2 mg at 04/03/21 2053   torsemide (DEMADEX) tablet 20 mg  20 mg Oral Daily Leatha Gilding, MD   20 mg at 04/04/21 1036     ALLERGIES: Hydrocodone, Latuda [lurasidone hcl], Magnesium-containing compounds, Prednisone, Tramadol, Codeine, Trazodone, Topamax [topiramate], Sulfa antibiotics, and Tape  Family History  Problem Relation Age of Onset   Cancer Mother        lung   Depression Mother    Cancer Father        prostate   Depression Sister    Anxiety disorder Sister    Schizophrenia Sister    Bipolar disorder Sister    Depression Sister    Depression Brother    Heart failure Other        cousin    Social History   Socioeconomic History   Marital status: Widowed    Spouse name: Not on file   Number of children: 3   Years of education: Not on file   Highest education level: Not on file  Occupational History   Occupation: disabled    Comment: factory production  Tobacco Use   Smoking status: Former    Packs/day: 1.50    Years: 38.00    Pack years: 57.00    Types: Cigarettes    Start date: 03/13/1977    Quit date: 04/10/2016    Years since quitting: 4.9  Smokeless tobacco: Never  Vaping Use   Vaping Use: Never used  Substance and Sexual Activity   Alcohol use: No    Alcohol/week: 0.0 standard drinks   Drug use: Not Currently    Types: Cocaine    Comment: 04/10/2016 "last used cocaine back in November 2017"   Sexual activity: Not Currently    Birth control/protection: Surgical  Other Topics Concern   Not on file  Social History Narrative   Has 1 son, [name]   Lives with son and his boyfriend   Her house has ramps and handrails should she ever  needs them.    Her mother lives down the street from her and is a good support person in addition to her son.   She drives herself, has private transportation.    Cocaine free since 02/24/16, smoke free since 04/10/16   Social Determinants of Health   Financial Resource Strain: Not on file  Food Insecurity: Not on file  Transportation Needs: Not on file  Physical Activity: Not on file  Stress: Not on file  Social Connections: Not on file  Intimate Partner Violence: Not on file    ROS  PHYSICAL EXAMINATION:    There were no vitals taken for this visit.    General appearance: alert, cooperative and appears stated age Neck: no adenopathy, supple, symmetrical, trachea midline and thyroid {CHL AMB PHY EX THYROID NORM DEFAULT:[phone]::"normal to inspection and palpation"} Breasts: {Exam; breast:13139::"normal appearance, no masses or tenderness"} Abdomen: soft, non-tender; non distended, no masses,  no organomegaly  Pelvic: External genitalia:  no lesions              Urethra:  normal appearing urethra with no masses, tenderness or lesions              Bartholins and Skenes: normal                 Vagina: normal appearing vagina with normal color and discharge, no lesions              Cervix: {CHL AMB PHY EX CERVIX NORM DEFAULT:[phone]::"no lesions"}              Bimanual Exam:  Uterus:  {CHL AMB PHY EX UTERUS NORM [phone]::"normal size, contour, position, consistency, mobility, non-tender"}              Adnexa: {CHL AMB PHY EX ADNEXA NO MASS [phone]::"no mass, fullness, tenderness"}              Rectovaginal: {yes no:[id number]}.  Confirms.              Anus:  normal sphincter tone, no lesions  Chaperone was present for exam.  ASSESSMENT     PLAN    An After Visit Summary was printed and given to the patient.  *** minutes face to face time of which over 50% was spent in counseling.

## 2021-04-04 NOTE — Progress Notes (Signed)
BP elevated 170/100. ?[place], NP notified. ?No new orders at this time ?

## 2021-04-04 NOTE — Evaluation (Signed)
Occupational Therapy Evaluation ?Patient Details ?Name: Tiffany [name] ?MRN: [id number] ?DOB: 1960/01/18 ?Today's Date: 04/04/2021 ? ? ?History of Present Illness Pt is a 61 y.o. female admitted 03/28/21 with BLE swelling, pain and difficulty walking; of note, multiple ER visits for BLE swelling. Course complicated by rectal bleeding, anemia. S/p colonoscopy 3/17 with blood in the colon, but no source found. PMH includes anoxic brain injury post-PEA arrest (2018), HF, COPD, HTN, DM2, seizure, PTSD, schizoaffective disorder.  ? ?Clinical Impression ?  ?Prior to this admission, patient was living alone with aide coming 7x/wk for 3-4 hours to assist wit ADLs. Patient endorses mother or family friend helps her otherwise. Patient stating aide will help with bathing and lower body dressing, but unable to state if she has assist for medication management (states she has 20 medications she takes) or how she has help with meals or other IADLs. Currently, patient is requiring min-max of 2 in order to stand, as patient fatigues quickly and buckles in BLEs and is mod-max to complete ADLs (if completed at bed level, +2 if attempting in standing). OT recommending SNF-level therapies to maximize functional mobility and decrease caregiver burden prior to return home.OT will continue to follow acutely in order to address deficits listed below.  ?   ? ?Recommendations for follow up therapy are one component of a multi-disciplinary discharge planning process, led by the attending physician.  Recommendations may be updated based on patient status, additional functional criteria and insurance authorization.  ? ?Follow Up Recommendations ? Skilled nursing-short term rehab (<3 hours/day)  ?  ?Assistance Recommended at Discharge Frequent or constant Supervision/Assistance  ?Patient can return home with the following A lot of help with walking and/or transfers;A lot of help with bathing/dressing/bathroom;Assistance with  cooking/housework;Assistance with feeding;Direct supervision/assist for medications management;Direct supervision/assist for financial management;Assist for transportation;Help with stairs or ramp for entrance ? ?  ?Functional Status Assessment ? Patient has had a recent decline in their functional status and demonstrates the ability to make significant improvements in function in a reasonable and predictable amount of time.  ?Equipment Recommendations ? Other (comment) (Will continue to assess)  ?  ?Recommendations for Other Services   ? ? ?  ?Precautions / Restrictions Precautions ?Precautions: Fall;Other (comment) ?Precaution Comments: urinary incontinence/frequency ?Restrictions ?Weight Bearing Restrictions: No  ? ?  ? ?Mobility Bed Mobility ?Overal bed mobility: Needs Assistance ?Bed Mobility: Supine to Sit, Sit to Supine ?Rolling: Max assist, +2 for physical assistance, +2 for safety/equipment ?  ?Supine to sit: Min assist, HOB elevated ?Sit to supine: Mod assist ?  ?General bed mobility comments: increased time and effort, minA for trunk elevation, cues to scoot hips to EOB; modA for BLE management with return to supine. pt reports breathing issues when laying flat for repositioning ?  ? ?Transfers ?Overall transfer level: Needs assistance ?Equipment used: Rolling walker (2 wheels) ?Transfers: Sit to/from Stand ?Sit to Stand: Min assist, Mod assist, +2 safety/equipment ?  ?  ?  ?  ?  ?General transfer comment: Performed 3x sit<>stand from EOB, initially requiring minA for trunk elevation then maxA for controlled descent to EOB due to pt's knees buckling; 2x additional stand with mod-maxA+2 for trunk elevation; able to take a few side steps at EOB with modA+2, noted R foot drop and difficulty progressing foot to step; consistent knee buckling requiring assist to sit back on EOB instead of falling to floor ?  ? ?  ?Balance Overall balance assessment: Needs assistance ?Sitting-balance support: No upper extremity  supported, Feet supported ?Sitting balance-Leahy Scale: Fair ?Sitting balance - Comments: pt required min guard in sitting for steadying with BUE support ?  ?Standing balance support: Bilateral upper extremity supported, Reliant on assistive device for balance ?Standing balance-Leahy Scale: Poor ?Standing balance comment: reliant on RW and external assist; no warning for BLE buckling ?  ?  ?  ?  ?  ?  ?  ?  ?  ?  ?  ?   ? ?ADL either performed or assessed with clinical judgement  ? ?ADL Overall ADL's : Needs assistance/impaired ?Eating/Feeding: Set up;Bed level ?  ?Grooming: Minimal assistance;Bed level ?  ?Upper Body Bathing: Minimal assistance;Sitting ?  ?Lower Body Bathing: Moderate assistance;Maximal assistance;Sitting/lateral leans ?  ?Upper Body Dressing : Set up;Sitting ?  ?Lower Body Dressing: Sitting/lateral leans;Maximal assistance ?  ?Toilet Transfer: Moderate assistance;+2 for physical assistance;+2 for safety/equipment;Stand-pivot ?  ?Toileting- Clothing Manipulation and Hygiene: Maximal assistance;+2 for physical assistance;+2 for safety/equipment;Sit to/from stand ?  ?  ?  ?Functional mobility during ADLs: Moderate assistance;+2 for physical assistance;+2 for safety/equipment;Cueing for safety;Cueing for sequencing;Rolling walker (2 wheels) ?General ADL Comments: Patient presenting with decreased activity tolerance, pain in bilateral legs, and decreased endurance  ? ? ? ?Vision Baseline Vision/History: 1 Wears glasses ?Ability to See in Adequate Light: 0 Adequate ?Patient Visual Report: No change from baseline ?   ?   ?Perception   ?  ?Praxis   ?  ? ?Pertinent Vitals/Pain Pain Assessment ?Pain Assessment: Faces ?Faces Pain Scale: Hurts a little bit ?Pain Location: RLE ?Pain Descriptors / Indicators: Discomfort ?Pain Intervention(s): Limited activity within patient's tolerance, Monitored during session, Repositioned  ? ? ? ?Hand Dominance Right ?  ?Extremity/Trunk Assessment Upper Extremity  Assessment ?Upper Extremity Assessment: Generalized weakness ?  ?Lower Extremity Assessment ?Lower Extremity Assessment: Defer to PT evaluation ?RLE Deficits / Details:  (sensation greater in L compared to R) ?RLE Sensation: decreased light touch;decreased proprioception;history of peripheral neuropathy ?RLE Coordination: decreased gross motor ?  ?Cervical / Trunk Assessment ?Cervical / Trunk Assessment: Kyphotic ?  ?Communication Communication ?Communication: No difficulties ?  ?Cognition Arousal/Alertness: Awake/alert ?Behavior During Therapy: Flat affect ?Overall Cognitive Status: History of cognitive impairments - at baseline ?  ?  ?  ?  ?  ?  ?  ?  ?  ?  ?  ?  ?  ?  ?  ?  ?General Comments: per chart, h/o anoxic brain injury (2018). no anxious behavior this session; pleasant and agreeable. increased time to follow commands at times; suspect decreased awareness. appreciative of therapy ?  ?  ?General Comments  pt endorses decreased light touch in RLE, noted h/o diabetic neuropathy and foot drop. pt with frequent urinary incontinence this session, but no BM noted. HR 106 with mobility ? ?  ?Exercises   ?  ?Shoulder Instructions    ? ? ?Home Living Family/patient expects to be discharged to:: Private residence ?Living Arrangements: Alone ?Available Help at Discharge: Personal care attendant;Family;Available PRN/intermittently (pt reports her mother, cousins and aide help her throughout the day.) ?Type of Home: Apartment ?Home Access: Level entry ?  ?  ?Home Layout: One level ?  ?  ?Bathroom Shower/Tub: Sponge bathes at baseline ?  ?  ?  ?  ?Home Equipment: Rollator (4 wheels) ?  ?Additional Comments: pt reports PCA comes M-F at 4pm; unsure when her aide leaves. Pt reports the aide gives her sponge baths and puts socks on her feet ?  ? ?  ?Prior Functioning/Environment Prior  Level of Function : Needs assist ?  ?  ?  ?  ?  ?  ?Mobility Comments: pt reports she uses a rollator to ambulate ?ADLs Comments: pt reports  aide assists with ADLs such as bathing ?  ? ?  ?  ?OT Problem List: Decreased activity tolerance;Decreased range of motion;Decreased strength;Impaired balance (sitting and/or standing);Decreased coordination;Decreased cognition;Decrea

## 2021-04-04 NOTE — Progress Notes (Signed)
Physical Therapy Treatment ?Patient Details ?Name: Tiffany [name] ?MRN: [id number] ?DOB: 15-Aug-1960 ?Today's Date: 04/04/2021 ? ? ?History of Present Illness Pt is a 61 y.o. female admitted 03/28/21 with BLE swelling, pain and difficulty walking; of note, multiple ER visits for BLE swelling. Course complicated by rectal bleeding, anemia. S/p colonoscopy 3/17 with blood in the colon, but no source found. PMH includes anoxic brain injury post-PEA arrest (2018), HF, COPD, HTN, DM2, seizure, PTSD, schizoaffective disorder. ?  ?PT Comments  ? ? Pt progressing with mobility. Today's session focused on transfer training with RW; pt requiring min-maxA+2 for mobility. Pt motivated to participate, but quick to fatigue with frequent BLE buckling. Pt limited by generalized weakness, decreased activity tolerance, and impaired balance strategies/postural reactions. Continue to recommend SNF-level therapies to maximize functional mobility and decrease caregiver burden prior to return home. ?   ?Recommendations for follow up therapy are one component of a multi-disciplinary discharge planning process, led by the attending physician.  Recommendations may be updated based on patient status, additional functional criteria and insurance authorization. ? ?Follow Up Recommendations ? Skilled nursing-short term rehab (<3 hours/day) ?  ?  ?Assistance Recommended at Discharge Frequent or constant Supervision/Assistance  ?Patient can return home with the following Two people to help with walking and/or transfers;A lot of help with bathing/dressing/bathroom;Assistance with cooking/housework;Assistance with feeding;Direct supervision/assist for medications management;Direct supervision/assist for financial management;Assist for transportation;Help with stairs or ramp for entrance ?  ?Equipment Recommendations ? BSC/3in1;Wheelchair (measurements PT);Wheelchair cushion (measurements PT)  ?  ?Recommendations for Other Services   ? ? ?  ?Precautions  / Restrictions Precautions ?Precautions: Fall;Other (comment) ?Precaution Comments: urinary incontinence/frequency ?Restrictions ?Weight Bearing Restrictions: No  ?  ? ?Mobility ? Bed Mobility ?Overal bed mobility: Needs Assistance ?Bed Mobility: Supine to Sit, Sit to Supine ?  ?  ?Supine to sit: Min assist, HOB elevated ?Sit to supine: Mod assist ?  ?General bed mobility comments: increased time and effort, minA for trunk elevation, cues to scoot hips to EOB; modA for BLE management with return to supine. pt reports breathing issues when laying flat for repositioning ?  ? ?Transfers ?Overall transfer level: Needs assistance ?Equipment used: Rolling walker (2 wheels) ?Transfers: Sit to/from Stand ?Sit to Stand: Min assist, Mod assist, +2 safety/equipment ?  ?  ?  ?  ?  ?General transfer comment: Performed 3x sit<>stand from EOB, initially requiring minA for trunk elevation then maxA for controlled descent to EOB due to pt's knees buckling; 2x additional stand with mod-maxA+2 for trunk elevation; able to take a few side steps at EOB with modA+2, noted R foot drop and difficulty progressing foot to step; consistent knee buckling requiring assist to sit back on EOB instead of falling to floor ?  ? ?Ambulation/Gait ?  ?  ?  ?  ?  ?  ?  ?General Gait Details: side steps EOB; futher distance limited by fatigue with standing trials ? ? ?Stairs ?  ?  ?  ?  ?  ? ? ?Wheelchair Mobility ?  ? ?Modified Rankin (Stroke Patients Only) ?  ? ? ?  ?Balance Overall balance assessment: Needs assistance ?Sitting-balance support: No upper extremity supported, Feet supported ?Sitting balance-Leahy Scale: Fair ?  ?  ?Standing balance support: Bilateral upper extremity supported, Reliant on assistive device for balance ?Standing balance-Leahy Scale: Poor ?Standing balance comment: reliant on RW and external assist; no warning for BLE buckling ?  ?  ?  ?  ?  ?  ?  ?  ?  ?  ?  ?  ? ?  ?  Cognition Arousal/Alertness: Awake/alert ?Behavior During  Therapy: Flat affect ?Overall Cognitive Status: History of cognitive impairments - at baseline ?  ?  ?  ?  ?  ?  ?  ?  ?  ?  ?  ?  ?  ?  ?  ?  ?General Comments: per chart, h/o anoxic brain injury (2018). no anxious behavior this session; pleasant and agreeable. increased time to follow commands at times; suspect decreased awareness. appreciative of therapy ?  ?  ? ?  ?Exercises   ? ?  ?General Comments General comments (skin integrity, edema, etc.): pt endorses decreased light touch in RLE, noted h/o diabetic neuropathy and foot drop. pt with frequent urinary incontinence this session, but no BM noted. HR 106 with mobility ?  ?  ? ?Pertinent Vitals/Pain Pain Assessment ?Pain Assessment: Faces ?Faces Pain Scale: Hurts a little bit ?Pain Location: RLE ?Pain Descriptors / Indicators: Discomfort ?Pain Intervention(s): Monitored during session, Repositioned  ? ? ?Home Living   ?  ?  ?  ?  ?  ?  ?  ?  ?  ?   ?  ?Prior Function    ?  ?  ?   ? ?PT Goals (current goals can now be found in the care plan section) Acute Rehab PT Goals ?Patient Stated Goal: "I want to move more" ?PT Goal Formulation: With patient ?Time For Goal Achievement: 04/12/21 ?Potential to Achieve Goals: Good ?Progress towards PT goals: Progressing toward goals ? ?  ?Frequency ? ? ? Min 2X/week ? ? ? ?  ?PT Plan Current plan remains appropriate  ? ? ?Co-evaluation   ?  ?  ?  ?  ? ?  ?AM-PAC PT "6 Clicks" Mobility   ?Outcome Measure ? Help needed turning from your back to your side while in a flat bed without using bedrails?: A Lot ?Help needed moving from lying on your back to sitting on the side of a flat bed without using bedrails?: A Lot ?Help needed moving to and from a bed to a chair (including a wheelchair)?: Total ?Help needed standing up from a chair using your arms (e.g., wheelchair or bedside chair)?: A Lot ?Help needed to walk in hospital room?: Total ?Help needed climbing 3-5 steps with a railing? : Total ?6 Click Score: 9 ? ?  ?End of  Session Equipment Utilized During Treatment: Gait belt ?Activity Tolerance: Patient tolerated treatment well;Patient limited by fatigue ?Patient left: in bed;with bed alarm set ?Nurse Communication: Mobility status ?PT Visit Diagnosis: Unsteadiness on feet (R26.81);Muscle weakness (generalized) (M62.81);History of falling (Z91.81);Difficulty in walking, not elsewhere classified (R26.2);Pain ?Pain - Right/Left: Right ?Pain - part of body: Leg ?  ? ? ?Time: 7824-2353 ?PT Time Calculation (min) (ACUTE ONLY): 24 min ? ?Charges:  $Therapeutic Activity: 8-22 mins          ?          ? ?Mabeline Caras, PT, DPT ?Acute Rehabilitation Services  ?Pager [phone] ?Office [phone] ? ?Derry Lory ?04/04/2021, 3:19 PM ? ?

## 2021-04-04 NOTE — Progress Notes (Signed)
BP elevated at 177/94. ?MD notified. ?

## 2021-04-04 NOTE — Progress Notes (Signed)
?PROGRESS NOTE ? ?Tiffany [name] KAJ:[id number] DOB: [birth year] DOA: 03/28/2021 ?PCP: [name], MD ? ? LOS: 5 days  ? ?Brief Narrative / Interim history: ?Tiffany [name] is a 61 y.o. female with medical history significant of Anoxic brain injury s/p PEA arrest in 0355, chronic diastolic heart failure, COPD, hypertension, type 2 diabetes, seizure, PTSD/schizoaffective disorder who hospitalist is being called for rectal bleeding. She had several ED visits for LE pain, was seen by PT and was awaiting SNF placement, when she noted to have several large blood clots on her briefs. ? ?Subjective / 24h Interval events: ?Some darker stools yesterday but no longer having any frank bleeding ? ?Assesement and Plan: ?Principal Problem: ?  Acute GI bleeding ?Active Problems: ?  Anemia ?  Diabetic neuropathy ([place]) ?  COPD (chronic obstructive pulmonary disease) ([place]) ?  Essential hypertension ?  Stage 3b chronic kidney disease (CKD) ([place]) ?  Schizophrenia, schizo-affective ([place]) ?  GI bleed ?  Hyperlipidemia ?  Hematochezia ? ?Assessment and Plan: ?Principal problem ?Acute lower GI bleed - Patient initially was boarding in the ED awaiting SNF placement but developed rectal bleeding and noted to have downward trending Hgb. She has documented colonoscopy and EGD for GI bleed in 2021 in outside facility in [place].  Unfortunately we do not have records of the findings.  GI consulted.  She underwent colonoscopy on 3/17 without a clear source of bleeding found, but there was blood throughout the entire colon.  She received a total of 7 units of packed red blood cells so far.  Fortunately it seems like she has stopped bleeding.  Possibly related to her home diclofenac, she was taking 2 pills every day ? ?Active problems ?Acute blood loss anemia -bleeding seems to be slowing down.  [name] status post total of 7 units of packed red blood cells.  Hemoglobin stable around 10.  Continue to monitor. ? ?LE neuropathy ([place]),  cramping-Has chronic lower extremity pain with increasing weakness / pain.  Continue gabapentin '600MG'$  BID. Cannot use other pain agents due to multiple significant allergies.  She was started on NSAID gel 2 nights ago, discontinued at given some degree of minimal skin absorption as well as ongoing GI bleed.  Avoid NSAIDs altogether.  Start Percocet for pain as it is quite severe this morning ? ?Schizophrenia, schizo-affective ([place]) -continue home asenapine.  Continue duloxetine as well.  This appears stable, compensated ? ?CKD 3b -creatinine a little bit better than baseline, 1.3.  Recheck tomorrow morning ? ?Essential hypertension -hold home medications with concern for GI bleed ? ?COPD (chronic obstructive pulmonary disease) ([facility]) -Stable, not in exacerbation ? ?Hyperlipidemia-continue statin ? ? ?Scheduled Meds: ? sodium chloride   Intravenous Once  ? amLODipine  10 mg Oral Daily  ? asenapine  10 mg Sublingual BID  ? atorvastatin  20 mg Oral Daily  ? DULoxetine  30 mg Oral Daily  ? ferrous sulfate  325 mg Oral Daily  ? gabapentin  600 mg Oral BID  ? oxybutynin  5 mg Oral BID  ? prazosin  2 mg Oral QHS  ? torsemide  20 mg Oral Daily  ? ?Continuous Infusions: ? ?PRN Meds:.acetaminophen, diazepam, fentaNYL (SUBLIMAZE) injection, hydrOXYzine, oxyCODONE-acetaminophen ? ?Diet Orders (From admission, onward)  ? ?  Start     Ordered  ? 04/02/21 1204  DIET SOFT Room service appropriate? Yes; Fluid consistency: Thin  Diet effective now       ?Question Answer Comment  ?Room service appropriate? Yes   ?  Fluid consistency: Thin   ?  ? 04/02/21 1203  ? ?  ?  ? ?  ? ? ?DVT prophylaxis: SCDs Start: 03/29/21 2013 ? ? ?Lab Results  ?Component Value Date  ? PLT 227 04/04/2021  ? ? ?  Code Status: Full Code ? ?Family Communication: no family at bedside ? ?Status is: Inpatient ? ?ABLA, GI bleed ? ? ?Level of care: Progressive ? ?Consultants:  ?GI ? ?Procedures:  ?none ? ?Microbiology  ?none ? ?Antimicrobials: ?none   ? ? ?Objective: ?Vitals:  ? 04/03/21 2025 04/03/21 2235 04/04/21 0715 04/04/21 1039  ?BP: (!) 183/115 (!) 159/96 (!) 177/94 (!) 147/94  ?Pulse: 97 94 82 100  ?Resp:   19 20  ?Temp:   98.1 ?F (36.7 ?C)   ?TempSrc:   Oral   ?SpO2: 100% 100% 96%   ?Weight:      ?Height:      ? ? ?Intake/Output Summary (Last 24 hours) at 04/04/2021 1331 ?Last data filed at 04/04/2021 0900 ?Gross per 24 hour  ?Intake 360 ml  ?Output 4920 ml  ?Net -4560 ml  ? ? ?Wt Readings from Last 3 Encounters:  ?04/01/21 73.9 kg  ?03/26/21 74.8 kg  ?03/19/21 72.6 kg  ? ? ?Examination: ? ?Constitutional: NAD ?Eyes: lids and conjunctivae normal, no scleral icterus ?ENMT: mmm ?Neck: normal, supple ?Respiratory: clear to auscultation bilaterally, no wheezing, no crackles. Normal respiratory effort.  ?Cardiovascular: Regular rate and rhythm, no murmurs / rubs / gallops. No LE edema. ?Abdomen: soft, no distention, no tenderness. Bowel sounds positive.  ?Skin: no rashes ?Neurologic: no focal deficits, equal strength ? ? ?Data Reviewed: I have independently reviewed following labs and imaging studies ? ?CBC ?Recent Labs  ?Lab 03/28/21 ?1843 03/29/21 ?1402 03/30/21 ?9702 04/01/21 ?6378 04/02/21 ?5885 04/03/21 ?0231 04/03/21 ?1653 04/04/21 ?0277  ?WBC 6.1 4.2   < > 6.7 6.9 8.7 11.0* 10.7*  ?HGB 10.6* 9.5*   < > 6.4* 6.9* 9.2* 10.7* 10.2*  ?HCT 35.0* 31.7*   < > 20.1* 20.9* 27.1* 32.2* 30.7*  ?PLT 405* 387   < > 220 175 194 132* 227  ?MCV 90.2 90.6   < > 87.0 86.7 88.9 90.7 90.0  ?MCH 27.3 27.1   < > 27.7 28.6 30.2 30.1 29.9  ?MCHC 30.3 30.0   < > 31.8 33.0 33.9 33.2 33.2  ?RDW 15.6* 15.6*   < > 16.9* 15.9* 15.4 15.7* 15.4  ?LYMPHSABS 2.0 1.1  --   --   --   --   --   --   ?MONOABS 0.7 0.4  --   --   --   --   --   --   ?EOSABS 0.2 0.2  --   --   --   --   --   --   ?BASOSABS 0.0 0.0  --   --   --   --   --   --   ? < > = values in this interval not displayed.  ? ? ? ?Recent Labs  ?Lab 03/28/21 ?1843 03/29/21 ?1402 03/30/21 ?4128 04/02/21 ?7867 04/04/21 ?6720   ?NA 139 141 137 141 141  ?K 3.9 4.0 4.0 3.8 3.3*  ?CL 103 108 103 109 104  ?CO2 '27 24 27 29 28  '$ ?GLUCOSE 87 99 87 96 92  ?BUN 29* 33* 35* 16 15  ?CREATININE 2.04* 1.76* 1.64* 1.32* 1.47*  ?CALCIUM 9.7 8.9 8.6* 8.6* 9.4  ?AST 34 27  --   --   --   ?  ALT 24 19  --   --   --   ?ALKPHOS 202* 178*  --   --   --   ?BILITOT 0.4 0.3  --   --   --   ?ALBUMIN 3.4* 3.1*  --   --   --   ?INR  --  1.2  --   --   --   ? ? ? ?------------------------------------------------------------------------------------------------------------------ ?No results for input(s): CHOL, HDL, LDLCALC, TRIG, CHOLHDL, LDLDIRECT in the last 72 hours. ? ?Lab Results  ?Component Value Date  ? HGBA1C 5.3 07/23/2020  ? ?------------------------------------------------------------------------------------------------------------------ ?No results for input(s): TSH, T4TOTAL, T3FREE, THYROIDAB in the last 72 hours. ? ?Invalid input(s): FREET3 ? ?Cardiac Enzymes ?No results for input(s): CKMB, TROPONINI, MYOGLOBIN in the last 168 hours. ? ?Invalid input(s): CK ?------------------------------------------------------------------------------------------------------------------ ?   ?Component Value Date/Time  ? BNP 27.7 03/18/2021 1010  ? ? ?CBG: ?Recent Labs  ?Lab 03/28/21 ?1854 04/01/21 ?1351  ?GLUCAP 74 93  ? ? ? ?No results found for this or any previous visit (from the past 240 hour(s)).  ? ?Radiology Studies: ?No results found. ? ? ?Marzetta Board, MD, PhD ?[facility] ? ?Between 7 am - 7 pm I am available, please contact me via Amion (for emergencies) or Securechat (non urgent messages) ? ?Between 7 pm - 7 am I am not available, please contact night coverage MD/APP via Amion ? ?

## 2021-04-04 NOTE — Telephone Encounter (Signed)
Pt appt cancelled today... she is ion the hospital with rectal bleeding... will call the pt after her D/C to make her another appt with Dr. Harrington Challenger. ?

## 2021-04-04 NOTE — Care Management Important Message (Signed)
Important Message ? ?Patient Details  ?Name: Tiffany [name] ?MRN: [id number] ?Date of Birth: Mar 31, 1960 ? ? ?Medicare Important Message Given:  Yes ? ? ? ? ?Tiffany [name] ?04/04/2021, 2:02 PM ?

## 2021-04-05 DIAGNOSIS — K921 Melena: Secondary | ICD-10-CM | POA: Diagnosis not present

## 2021-04-05 DIAGNOSIS — K922 Gastrointestinal hemorrhage, unspecified: Secondary | ICD-10-CM | POA: Diagnosis not present

## 2021-04-05 LAB — CBC
HCT: 28.1 % — ABNORMAL LOW (ref 36.0–46.0)
Hemoglobin: 9.4 g/dL — ABNORMAL LOW (ref 12.0–15.0)
MCH: 30.6 pg (ref 26.0–34.0)
MCHC: 33.5 g/dL (ref 30.0–36.0)
MCV: 91.5 fL (ref 80.0–100.0)
Platelets: 252 10*3/uL (ref 150–400)
RBC: 3.07 MIL/uL — ABNORMAL LOW (ref 3.87–5.11)
RDW: 15.8 % — ABNORMAL HIGH (ref 11.5–15.5)
WBC: 10.3 10*3/uL (ref 4.0–10.5)
nRBC: 0 % (ref 0.0–0.2)

## 2021-04-05 LAB — COMPREHENSIVE METABOLIC PANEL
ALT: 18 U/L (ref 0–44)
AST: 17 U/L (ref 15–41)
Albumin: 2.7 g/dL — ABNORMAL LOW (ref 3.5–5.0)
Alkaline Phosphatase: 122 U/L (ref 38–126)
Anion gap: 7 (ref 5–15)
BUN: 19 mg/dL (ref 8–23)
CO2: 27 mmol/L (ref 22–32)
Calcium: 8.7 mg/dL — ABNORMAL LOW (ref 8.9–10.3)
Chloride: 100 mmol/L (ref 98–111)
Creatinine, Ser: 1.49 mg/dL — ABNORMAL HIGH (ref 0.44–1.00)
GFR, Estimated: 40 mL/min — ABNORMAL LOW (ref >60)
Glucose, Bld: 99 mg/dL (ref 70–99)
Potassium: 3.4 mmol/L — ABNORMAL LOW (ref 3.5–5.1)
Sodium: 134 mmol/L — ABNORMAL LOW (ref 135–145)
Total Bilirubin: 0.3 mg/dL (ref 0.3–1.2)
Total Protein: 4.9 g/dL — ABNORMAL LOW (ref 6.5–8.1)

## 2021-04-05 NOTE — Plan of Care (Signed)

## 2021-04-05 NOTE — Progress Notes (Signed)
?PROGRESS NOTE ? ?Tiffany [name] WHQ:[id number] DOB: 1960-12-24 DOA: 03/28/2021 ?PCP: [name], MD ? ? LOS: 6 days  ? ?Brief Narrative / Interim history: ?Tiffany [name] is a 61 y.o. female with medical history significant of Anoxic brain injury s/p PEA arrest in 6599, chronic diastolic heart failure, COPD, hypertension, type 2 diabetes, seizure, PTSD/schizoaffective disorder who hospitalist is being called for rectal bleeding. She had several ED visits for LE pain, was seen by PT and was awaiting SNF placement, when she noted to have several large blood clots on her briefs. GI consulted. She underwent colonoscopy on 3/17 without clear source. Now bleeding has stopped and she is pending placement ? ?Subjective / 24h Interval events: ?Some darker stools yesterday but no longer having any frank bleeding ? ?Assesement and Plan: ?Principal Problem: ?  Acute GI bleeding ?Active Problems: ?  Anemia ?  Diabetic neuropathy ([place]) ?  COPD (chronic obstructive pulmonary disease) ([place]) ?  Essential hypertension ?  Stage 3b chronic kidney disease (CKD) ([place]) ?  Schizophrenia, schizo-affective ([place]) ?  GI bleed ?  Hyperlipidemia ?  Hematochezia ? ?Assessment and Plan: ?Principal problem ?Acute lower GI bleed - Patient initially was boarding in the ED awaiting SNF placement but developed rectal bleeding and noted to have downward trending Hgb. She has documented colonoscopy and EGD for GI bleed in 2021 in outside facility in [place].  Unfortunately we do not have records of the findings.  GI consulted.  She underwent colonoscopy on 3/17 without a clear source of bleeding found, but there was blood throughout the entire colon.  She received a total of 7 units of packed red blood cells so far.  Clinically she has stopped bleeding.  Possibly related to her home diclofenac, she was taking 2 pills every day ? ?Active problems ?Acute blood loss anemia -bleeding seems to have stopped.  [name] status post total of 7 units of  packed red blood cells.  Hemoglobin stable. Continue to monitor ? ?LE neuropathy ([place]), cramping-Has chronic lower extremity pain with increasing weakness / pain.  Continue gabapentin '600MG'$  BID. Cannot use other pain agents due to multiple significant allergies.  She was started on NSAID gel few nights ago, discontinued at given some degree of minimal skin absorption as well as ongoing GI bleed.  Avoid NSAIDs altogether. ? ?Schizophrenia, schizo-affective ([place]) -continue home asenapine.  Continue duloxetine as well.  This appears stable, compensated ? ?CKD 3b -Cr remains at baseline ? ?Essential hypertension -hold home medications with concern for GI bleed ? ?COPD (chronic obstructive pulmonary disease) (HCC) -Stable, not in exacerbation ? ?Hyperlipidemia-continue statin ? ?Weakness - SNF pending, patient currently not accepting any bed offers and SW working on additional referrals ? ?Scheduled Meds: ? sodium chloride   Intravenous Once  ? amLODipine  10 mg Oral Daily  ? asenapine  10 mg Sublingual BID  ? atorvastatin  20 mg Oral Daily  ? DULoxetine  30 mg Oral Daily  ? ferrous sulfate  325 mg Oral Daily  ? gabapentin  600 mg Oral BID  ? oxybutynin  5 mg Oral BID  ? prazosin  2 mg Oral QHS  ? torsemide  20 mg Oral Daily  ? ?Continuous Infusions: ? ?PRN Meds:.acetaminophen, diazepam, fentaNYL (SUBLIMAZE) injection, hydrOXYzine, oxyCODONE-acetaminophen ? ?Diet Orders (From admission, onward)  ? ?  Start     Ordered  ? 04/02/21 1204  DIET SOFT Room service appropriate? Yes; Fluid consistency: Thin  Diet effective now       ?  Question Answer Comment  ?Room service appropriate? Yes   ?Fluid consistency: Thin   ?  ? 04/02/21 1203  ? ?  ?  ? ?  ? ? ?DVT prophylaxis: SCDs Start: 03/29/21 2013 ? ? ?Lab Results  ?Component Value Date  ? PLT 252 04/05/2021  ? ? ?  Code Status: Full Code ? ?Family Communication: no family at bedside ? ?Status is: Inpatient ? ?ABLA, GI bleed ? ? ?Level of care: Progressive ? ?Consultants:   ?GI ? ?Procedures:  ?none ? ?Microbiology  ?none ? ?Antimicrobials: ?none  ? ? ?Objective: ?Vitals:  ? 04/04/21 2024 04/05/21 2426 04/05/21 8341 04/05/21 9622  ?BP: (!) 152/94 122/81  (!) 153/94  ?Pulse: 96 96 85   ?Resp: '20 20 18   '$ ?Temp: 98.4 ?F (36.9 ?C) 98 ?F (36.7 ?C) 97.9 ?F (36.6 ?C)   ?TempSrc: Oral  Axillary   ?SpO2: 100% 100%    ?Weight:      ?Height:      ? ? ?Intake/Output Summary (Last 24 hours) at 04/05/2021 1358 ?Last data filed at 04/05/2021 [phone] ?Gross per 24 hour  ?Intake 218 ml  ?Output 1700 ml  ?Net -1482 ml  ? ? ?Wt Readings from Last 3 Encounters:  ?04/01/21 73.9 kg  ?03/26/21 74.8 kg  ?03/19/21 72.6 kg  ? ? ?Examination: ? ?Constitutional: NAD ?Eyes: lids and conjunctivae normal, no scleral icterus ?ENMT: mmm ?Neck: normal, supple ?Respiratory: clear to auscultation bilaterally, no wheezing, no crackles. Normal respiratory effort.  ?Cardiovascular: Regular rate and rhythm, no murmurs / rubs / gallops. No LE edema. ?Abdomen: soft, no distention, no tenderness. Bowel sounds positive.  ?Skin: no rashes ?Neurologic: no focal deficits, equal strength ? ? ?Data Reviewed: I have independently reviewed following labs and imaging studies ? ?CBC ?Recent Labs  ?Lab 03/29/21 ?1402 03/30/21 ?0248 04/03/21 ?0231 04/03/21 ?1653 04/04/21 ?8921 04/04/21 ?1700 04/05/21 ?1941  ?WBC 4.2   < > 8.7 11.0* 10.7* 11.6* 10.3  ?HGB 9.5*   < > 9.2* 10.7* 10.2* 11.0* 9.4*  ?HCT 31.7*   < > 27.1* 32.2* 30.7* 32.8* 28.1*  ?PLT 387   < > 194 132* 227 266 252  ?MCV 90.6   < > 88.9 90.7 90.0 90.6 91.5  ?MCH 27.1   < > 30.2 30.1 29.9 30.4 30.6  ?MCHC 30.0   < > 33.9 33.2 33.2 33.5 33.5  ?RDW 15.6*   < > 15.4 15.7* 15.4 15.7* 15.8*  ?LYMPHSABS 1.1  --   --   --   --   --   --   ?MONOABS 0.4  --   --   --   --   --   --   ?EOSABS 0.2  --   --   --   --   --   --   ?BASOSABS 0.0  --   --   --   --   --   --   ? < > = values in this interval not displayed.  ? ? ? ?Recent Labs  ?Lab 03/29/21 ?1402 03/30/21 ?0248 04/02/21 ?7408  04/04/21 ?1448 04/05/21 ?1856  ?NA 141 137 141 141 134*  ?K 4.0 4.0 3.8 3.3* 3.4*  ?CL 108 103 109 104 100  ?CO2 '24 27 29 28 27  '$ ?GLUCOSE 99 87 96 92 99  ?BUN 33* 35* '16 15 19  '$ ?CREATININE 1.76* 1.64* 1.32* 1.47* 1.49*  ?CALCIUM 8.9 8.6* 8.6* 9.4 8.7*  ?AST 27  --   --   --  17  ?ALT 19  --   --   --  18  ?ALKPHOS 178*  --   --   --  122  ?BILITOT 0.3  --   --   --  0.3  ?ALBUMIN 3.1*  --   --   --  2.7*  ?INR 1.2  --   --   --   --   ? ? ? ?------------------------------------------------------------------------------------------------------------------ ?No results for input(s): CHOL, HDL, LDLCALC, TRIG, CHOLHDL, LDLDIRECT in the last 72 hours. ? ?Lab Results  ?Component Value Date  ? HGBA1C 5.3 07/23/2020  ? ?------------------------------------------------------------------------------------------------------------------ ?No results for input(s): TSH, T4TOTAL, T3FREE, THYROIDAB in the last 72 hours. ? ?Invalid input(s): FREET3 ? ?Cardiac Enzymes ?No results for input(s): CKMB, TROPONINI, MYOGLOBIN in the last 168 hours. ? ?Invalid input(s): CK ?------------------------------------------------------------------------------------------------------------------ ?   ?Component Value Date/Time  ? BNP 27.7 03/18/2021 1010  ? ? ?CBG: ?Recent Labs  ?Lab 04/01/21 ?1351  ?GLUCAP 93  ? ? ? ?No results found for this or any previous visit (from the past 240 hour(s)).  ? ?Radiology Studies: ?No results found. ? ? ?Marzetta Board, MD, PhD ?[facility] ? ?Between 7 am - 7 pm I am available, please contact me via Amion (for emergencies) or Securechat (non urgent messages) ? ?Between 7 pm - 7 am I am not available, please contact night coverage MD/APP via Amion ? ?

## 2021-04-05 NOTE — TOC Progression Note (Signed)
Transition of Care (TOC) - Progression Note  ? ? ?Patient Details  ?Name: Tiffany [name] ?MRN: [id number] ?Date of Birth: July 20, 1960 ? ?Transition of Care (TOC) CM/SW Contact  ?Joanne Chars, LCSW ?Phone Number: ?04/05/2021, 1:14 PM ? ?Clinical Narrative:    ?PASSR approved: [id number] B. ? ?[name] spoke with pt who states that she does not want to accept either of her current bed offers [facility], [address]) Pt spoke to someone at [facility] and asked that [name] send referral there, which was done.   ? ?1310: [facility].  [name] spoke with pt again, friend in the room, pt became irritated when [name] asked if I could discuss placement in front of friend.  Pt states that she already knows [facility] declined because she spoke to them.  Pt wanting to know what information [name] is sending that is causing 4 star facilities to decline her.  [name] attempted to discuss process, pt states she is "getting a bad vibe" from [facility] and would like to talk to [facility] supervisor, who was informed.   ? ? ?Expected Discharge Plan: [place] ?Barriers to Discharge: Continued Medical Work up ? ?Expected Discharge Plan and Services ?Expected Discharge Plan: [place] ?  ?  ?  ?Living arrangements for the past 2 months: Apartment ?                ?  ?  ?  ?  ?  ?  ?  ?  ?  ?  ? ? ?Social Determinants of Health (SDOH) Interventions ?  ? ?Readmission Risk Interventions ?Readmission Risk Prevention Plan 07/26/2020 02/23/2020  ?Transportation Screening Complete Complete  ?Medication Review Press photographer) Complete Complete  ?PCP or Specialist appointment within 3-5 days of discharge Complete Complete  ?[place] or Home Care Consult Complete Complete  ?SW Recovery Care/Counseling Consult Complete Complete  ?Palliative Care Screening Not Applicable Complete  ?Skilled Nursing Facility Complete Complete  ?Some recent data might be hidden  ? ? ?

## 2021-04-05 NOTE — TOC Progression Note (Signed)
Transition of Care (TOC) - Progression Note  ? ? ?Patient Details  ?Name: Tiffany [name] ?MRN: [id number] ?Date of Birth: [birth year] ? ?Transition of Care (TOC) CM/SW Contact  ?Joanne Chars, LCSW ?Phone Number: ?04/05/2021, 8:46 AM ? ?Clinical Narrative:  [name] received message from [place] [facility] that the report was not accepted for investigation.   ? ? ? ?Expected Discharge Plan: [place] ?Barriers to Discharge: Continued Medical Work up ? ?Expected Discharge Plan and Services ?Expected Discharge Plan: [place] ?  ?  ?  ?Living arrangements for the past 2 months: Apartment ?                ?  ?  ?  ?  ?  ?  ?  ?  ?  ?  ? ? ?Social Determinants of Health (SDOH) Interventions ?  ? ?Readmission Risk Interventions ?Readmission Risk Prevention Plan 07/26/2020 02/23/2020  ?Transportation Screening Complete Complete  ?Medication Review Press photographer) Complete Complete  ?PCP or Specialist appointment within 3-5 days of discharge Complete Complete  ?[place] or Home Care Consult Complete Complete  ?SW Recovery Care/Counseling Consult Complete Complete  ?Palliative Care Screening Not Applicable Complete  ?Skilled Nursing Facility Complete Complete  ?Some recent data might be hidden  ? ? ?

## 2021-04-06 DIAGNOSIS — R109 Unspecified abdominal pain: Secondary | ICD-10-CM | POA: Diagnosis not present

## 2021-04-06 DIAGNOSIS — R6889 Other general symptoms and signs: Secondary | ICD-10-CM | POA: Diagnosis not present

## 2021-04-06 DIAGNOSIS — I5032 Chronic diastolic (congestive) heart failure: Secondary | ICD-10-CM | POA: Diagnosis not present

## 2021-04-06 DIAGNOSIS — R079 Chest pain, unspecified: Secondary | ICD-10-CM | POA: Diagnosis not present

## 2021-04-06 DIAGNOSIS — R1084 Generalized abdominal pain: Secondary | ICD-10-CM | POA: Diagnosis not present

## 2021-04-06 DIAGNOSIS — E1140 Type 2 diabetes mellitus with diabetic neuropathy, unspecified: Secondary | ICD-10-CM | POA: Diagnosis not present

## 2021-04-06 DIAGNOSIS — N1832 Chronic kidney disease, stage 3b: Secondary | ICD-10-CM | POA: Diagnosis not present

## 2021-04-06 DIAGNOSIS — Z1159 Encounter for screening for other viral diseases: Secondary | ICD-10-CM | POA: Diagnosis not present

## 2021-04-06 DIAGNOSIS — D509 Iron deficiency anemia, unspecified: Secondary | ICD-10-CM | POA: Diagnosis not present

## 2021-04-06 DIAGNOSIS — R0789 Other chest pain: Secondary | ICD-10-CM | POA: Diagnosis not present

## 2021-04-06 DIAGNOSIS — E46 Unspecified protein-calorie malnutrition: Secondary | ICD-10-CM | POA: Diagnosis not present

## 2021-04-06 DIAGNOSIS — Z743 Need for continuous supervision: Secondary | ICD-10-CM | POA: Diagnosis not present

## 2021-04-06 DIAGNOSIS — G894 Chronic pain syndrome: Secondary | ICD-10-CM | POA: Diagnosis not present

## 2021-04-06 DIAGNOSIS — M109 Gout, unspecified: Secondary | ICD-10-CM | POA: Diagnosis not present

## 2021-04-06 DIAGNOSIS — I10 Essential (primary) hypertension: Secondary | ICD-10-CM | POA: Diagnosis not present

## 2021-04-06 DIAGNOSIS — E785 Hyperlipidemia, unspecified: Secondary | ICD-10-CM | POA: Diagnosis not present

## 2021-04-06 DIAGNOSIS — K922 Gastrointestinal hemorrhage, unspecified: Secondary | ICD-10-CM | POA: Diagnosis not present

## 2021-04-06 DIAGNOSIS — D62 Acute posthemorrhagic anemia: Secondary | ICD-10-CM | POA: Diagnosis not present

## 2021-04-06 DIAGNOSIS — Z7401 Bed confinement status: Secondary | ICD-10-CM | POA: Diagnosis not present

## 2021-04-06 DIAGNOSIS — J449 Chronic obstructive pulmonary disease, unspecified: Secondary | ICD-10-CM | POA: Diagnosis not present

## 2021-04-06 DIAGNOSIS — E876 Hypokalemia: Secondary | ICD-10-CM | POA: Diagnosis not present

## 2021-04-06 DIAGNOSIS — R404 Transient alteration of awareness: Secondary | ICD-10-CM | POA: Diagnosis not present

## 2021-04-06 DIAGNOSIS — K219 Gastro-esophageal reflux disease without esophagitis: Secondary | ICD-10-CM | POA: Diagnosis not present

## 2021-04-06 DIAGNOSIS — R519 Headache, unspecified: Secondary | ICD-10-CM | POA: Diagnosis not present

## 2021-04-06 DIAGNOSIS — E1169 Type 2 diabetes mellitus with other specified complication: Secondary | ICD-10-CM | POA: Diagnosis not present

## 2021-04-06 DIAGNOSIS — I6529 Occlusion and stenosis of unspecified carotid artery: Secondary | ICD-10-CM | POA: Diagnosis not present

## 2021-04-06 DIAGNOSIS — G931 Anoxic brain damage, not elsewhere classified: Secondary | ICD-10-CM | POA: Diagnosis not present

## 2021-04-06 DIAGNOSIS — R770 Abnormality of albumin: Secondary | ICD-10-CM | POA: Diagnosis not present

## 2021-04-06 LAB — BASIC METABOLIC PANEL
Anion gap: 7 (ref 5–15)
BUN: 24 mg/dL — ABNORMAL HIGH (ref 8–23)
CO2: 29 mmol/L (ref 22–32)
Calcium: 9 mg/dL (ref 8.9–10.3)
Chloride: 99 mmol/L (ref 98–111)
Creatinine, Ser: 1.66 mg/dL — ABNORMAL HIGH (ref 0.44–1.00)
GFR, Estimated: 35 mL/min — ABNORMAL LOW (ref >60)
Glucose, Bld: 99 mg/dL (ref 70–99)
Potassium: 3.5 mmol/L (ref 3.5–5.1)
Sodium: 135 mmol/L (ref 135–145)

## 2021-04-06 LAB — CBC
HCT: 28.3 % — ABNORMAL LOW (ref 36.0–46.0)
Hemoglobin: 9.1 g/dL — ABNORMAL LOW (ref 12.0–15.0)
MCH: 30 pg (ref 26.0–34.0)
MCHC: 32.2 g/dL (ref 30.0–36.0)
MCV: 93.4 fL (ref 80.0–100.0)
Platelets: 282 10*3/uL (ref 150–400)
RBC: 3.03 MIL/uL — ABNORMAL LOW (ref 3.87–5.11)
RDW: 15.9 % — ABNORMAL HIGH (ref 11.5–15.5)
WBC: 8.2 10*3/uL (ref 4.0–10.5)
nRBC: 0 % (ref 0.0–0.2)

## 2021-04-06 MED ORDER — LISDEXAMFETAMINE DIMESYLATE 10 MG PO CAPS: 10.0000 mg | ORAL_CAPSULE | Freq: Every day | ORAL | 0 refills | Status: DC

## 2021-04-06 MED ORDER — DIAZEPAM 5 MG PO TABS: 5.0000 mg | ORAL_TABLET | Freq: Every day | ORAL | 0 refills | Status: DC | PRN

## 2021-04-06 MED ORDER — HYDROXYZINE HCL 25 MG PO TABS: 25.0000 mg | ORAL_TABLET | Freq: Three times a day (TID) | ORAL | 0 refills | Status: DC | PRN

## 2021-04-06 MED ORDER — METHOCARBAMOL 500 MG PO TABS: 500.0000 mg | ORAL_TABLET | Freq: Four times a day (QID) | ORAL | Status: DC | PRN

## 2021-04-06 MED ORDER — METHOCARBAMOL 500 MG PO TABS
500.0000 mg | ORAL_TABLET | Freq: Four times a day (QID) | ORAL | Status: DC | PRN
  Administered 2021-04-06: 500 mg via ORAL
  Filled 2021-04-06: qty 1

## 2021-04-06 MED ORDER — POTASSIUM CHLORIDE CRYS ER 20 MEQ PO TBCR
40.0000 meq | EXTENDED_RELEASE_TABLET | Freq: Once | ORAL | Status: AC
Start: 2021-04-06 — End: 2021-04-06
  Administered 2021-04-06: 40 meq via ORAL
  Filled 2021-04-06: qty 2

## 2021-04-06 MED ORDER — ASENAPINE MALEATE 10 MG SL SUBL: 10.0000 mg | SUBLINGUAL_TABLET | Freq: Two times a day (BID) | SUBLINGUAL | 0 refills | Status: DC

## 2021-04-06 NOTE — Discharge Summary (Signed)
Physician Discharge Summary  ?Arti Trang Okun LPF:[id number] DOB: [birth year] DOA: 03/28/2021 ? ?PCP: [name], MD ? ?Admit date: 03/28/2021 ?Discharge date: 04/06/2021 ? ?Admitted From: Home ?Disposition:  Home ? ?Discharge Condition:Stable ?CODE STATUS:FULL ?Diet recommendation: Soft ? ?Brief/Interim Summary: ? ?Tiffany [name] is a 61 y.o. female with medical history significant of Anoxic brain injury s/p PEA arrest in 5329, chronic diastolic heart failure, COPD, hypertension, type 2 diabetes, seizure, PTSD/schizoaffective disorder who hospitalist is being called for rectal bleeding. She had several ED visits for LE pain, was seen by PT and was awaiting SNF placement, when she noted to have several large blood clots on her briefs. GI consulted. She underwent colonoscopy on 3/17 without clear source. Now bleeding has stopped and she is pending placement.  Medically stable for discharge to skilled nursing facility whenever possible. ? ?Following problems were addressed during her hospitalization: ? ? ?Acute lower GI bleed - Patient initially was boarding in the ED awaiting SNF placement but developed rectal bleeding and noted to have downward trending Hgb. She has documented colonoscopy and EGD for GI bleed in 2021 in outside facility in [place].  Unfortunately we do not have records of the findings.  GI consulted.  She underwent colonoscopy on 3/17 without a clear source of bleeding found, but there was blood throughout the entire colon.  She received a total of 7 units of packed red blood cells so far.  Clinically she has stopped bleeding.  Possibly related to her home diclofenac, she was taking 2 pills every day ?  ?Acute blood loss anemia -bleeding seems to have stopped.  [name] status post total of 7 units of packed red blood cells.  Hemoglobin stable. Continue to monitor as an outpatient ?  ?LE neuropathy ([place]), cramping-Has chronic lower extremity pain with increasing weakness / pain.  Continue gabapentin  '600MG'$  BID. Cannot use other pain agents due to multiple significant allergies.  She was started on NSAID gel few nights ago, discontinued at given some degree of minimal skin absorption as well as ongoing GI bleed.  Avoid NSAIDs altogether.  Can try Robaxin as needed. ?  ?Schizophrenia, schizo-affective ([place]) -continue home asenapine.  Continue duloxetine as well.  This appears stable, compensated ?  ?CKD 3b -Cr remains at baseline ?  ?Essential hypertension -On amlodipine ?  ?COPD (chronic obstructive pulmonary disease) (HCC) -Stable, not in exacerbation.  Continue home inhaler ?  ?Hyperlipidemia-continue statin ?  ?Weakness -PT/OT recommending skilled nursing facility on  discharge  ? ?discharge Diagnoses:  ?Principal Problem: ?  Acute GI bleeding ?Active Problems: ?  Anemia ?  Diabetic neuropathy ([place]) ?  COPD (chronic obstructive pulmonary disease) ([place]) ?  Essential hypertension ?  Stage 3b chronic kidney disease (CKD) ([place]) ?  Schizophrenia, schizo-affective ([place]) ?  GI bleed ?  Hyperlipidemia ?  Hematochezia ? ? ? ?Discharge Instructions ? ?Discharge Instructions   ? ? Diet general   Complete by: As directed ?  ? Soft diet  ? Discharge instructions   Complete by: As directed ?  ? Please do a CBC and BMP test in a week  ? Increase activity slowly   Complete by: As directed ?  ? ?  ? ?Allergies as of 04/06/2021   ? ?   Reactions  ? Hydrocodone Shortness Of Breath  ? Latuda [lurasidone Hcl] Anaphylaxis  ? Magnesium-containing Compounds Anaphylaxis  ? Tolerated Ensure  ? Prednisone Anaphylaxis, Swelling, Other (See Comments)  ? Tongue swelling, lip swelling, throat swelling, per pt   ?  Tramadol Anaphylaxis, Swelling  ? Codeine Nausea And Vomiting  ? Trazodone Other (See Comments)  ? paranoia  ? Topamax [topiramate] Other (See Comments)  ? Increases paranoia  ? Sulfa Antibiotics Itching  ? Tape Rash  ? ?  ? ?  ?Medication List  ?  ? ?STOP taking these medications   ? ?diclofenac 75 MG EC tablet ?Commonly known as:  VOLTAREN ?  ?febuxostat 40 MG tablet ?Commonly known as: Uloric ?  ?furosemide 40 MG tablet ?Commonly known as: LASIX ?  ?furosemide 80 MG tablet ?Commonly known as: LASIX ?  ?pregabalin 75 MG capsule ?Commonly known as: LYRICA ?  ? ?  ? ?TAKE these medications   ? ?albuterol (2.5 MG/3ML) 0.083% nebulizer solution ?Commonly known as: PROVENTIL ?Take 3 mLs (2.5 mg total) by nebulization every 6 (six) hours as needed for wheezing or shortness of breath. ?  ?albuterol 108 (90 Base) MCG/ACT inhaler ?Commonly known as: VENTOLIN HFA ?Inhale 2 puffs into the lungs every 6 (six) hours as needed for wheezing or shortness of breath. ?  ?amLODipine 10 MG tablet ?Commonly known as: NORVASC ?TAKE 1 TABLET(10 MG) BY MOUTH DAILY ?What changed: See the new instructions. ?  ?Asenapine Maleate 10 MG Subl ?Place 1 tablet (10 mg total) under the tongue 2 (two) times daily. ?  ?atorvastatin 20 MG tablet ?Commonly known as: LIPITOR ?Take 1 tablet (20 mg total) by mouth at bedtime. ?  ?colchicine 0.6 MG tablet ?Take 2 tablets by mouth at the onset of gout flare, may repeat 1 tablet in 1 hour if symptoms persist ?What changed:  ?how much to take ?how to take this ?when to take this ?additional instructions ?  ?diazepam 5 MG tablet ?Commonly known as: VALIUM ?Take 1 tablet (5 mg total) by mouth daily as needed for anxiety. ?What changed:  ?when to take this ?reasons to take this ?  ?diclofenac Sodium 1 % Gel ?Commonly known as: Voltaren ?Apply 4 g topically 4 (four) times daily. ?What changed:  ?when to take this ?reasons to take this ?  ?dicyclomine 10 MG capsule ?Commonly known as: Bentyl ?Take 1 capsule (10 mg total) by mouth 3 (three) times daily before meals. ?  ?DULoxetine 30 MG capsule ?Commonly known as: CYMBALTA ?Take 30 mg by mouth daily. ?  ?escitalopram 20 MG tablet ?Commonly known as: LEXAPRO ?Take 20 mg by mouth every morning. ?  ?gabapentin 600 MG tablet ?Commonly known as: NEURONTIN ?Take 1 tablet (600 mg total) by mouth 2  (two) times daily. ?  ?hydrOXYzine 25 MG tablet ?Commonly known as: ATARAX ?Take 1 tablet (25 mg total) by mouth every 8 (eight) hours as needed. For anxiety ?What changed:  ?when to take this ?reasons to take this ?  ?Iron (Ferrous Sulfate) 325 (65 Fe) MG Tabs ?Take 325 mg by mouth daily. ?  ?ketoconazole 2 % cream ?Commonly known as: NIZORAL ?Apply 1 application. topically daily. Apply to finger nails ?  ?lisdexamfetamine 10 MG capsule ?Commonly known as: VYVANSE ?Take 10 mg by mouth daily at 12 noon. ?  ?methocarbamol 500 MG tablet ?Commonly known as: ROBAXIN ?Take 1 tablet (500 mg total) by mouth every 6 (six) hours as needed for muscle spasms. ?  ?montelukast 10 MG tablet ?Commonly known as: SINGULAIR ?Take 1 tablet (10 mg total) by mouth at bedtime. ?  ?multivitamin with minerals Tabs tablet ?Take 1 tablet by mouth daily. ?  ?omeprazole 40 MG capsule ?Commonly known as: PRILOSEC ?TAKE 1 CAPSULE(40 MG) BY MOUTH DAILY ?  What changed: See the new instructions. ?  ?oxybutynin 5 MG tablet ?Commonly known as: DITROPAN ?TAKE 1 TABLET(5 MG) BY MOUTH TWICE DAILY ?What changed: See the new instructions. ?  ?potassium chloride SA 20 MEQ tablet ?Commonly known as: KLOR-CON M ?Take 1 tablet (20 mEq total) by mouth daily. ?What changed:  ?how much to take ?when to take this ?  ?prazosin 2 MG capsule ?Commonly known as: MINIPRESS ?Take 1 capsule (2 mg total) by mouth at bedtime. ?  ?sodium chloride 0.65 % Soln nasal spray ?Commonly known as: OCEAN ?Place 2 sprays into both nostrils daily. ?  ?terbinafine 1 % cream ?Commonly known as: LamISIL AT ?Apply 1 application topically 2 (two) times daily. ?What changed:  ?when to take this ?additional instructions ?  ?thiamine 100 MG tablet ?Take 1 tablet (100 mg total) by mouth daily. ?  ?torsemide 20 MG tablet ?Commonly known as: DEMADEX ?Take 1 tablet (20 mg total) by mouth daily. ?  ?Trelegy Ellipta 100-62.5-25 MCG/ACT Aepb ?Generic drug: Fluticasone-Umeclidin-Vilant ?Inhale 1 puff  into the lungs daily. ?  ? ?  ? ? ?Allergies  ?Allergen Reactions  ? Hydrocodone Shortness Of Breath  ? Anette Guarneri [Lurasidone Hcl] Anaphylaxis  ? Magnesium-Containing Compounds Anaphylaxis  ?  Tolerated

## 2021-04-06 NOTE — TOC Progression Note (Addendum)
Transition of Care (TOC) - Progression Note  ? ? ?Patient Details  ?Name: Tiffany [name] ?MRN: [id number] ?Date of Birth: 28-Aug-1960 ? ?Transition of Care (TOC) CM/SW Contact  ?Joanne Chars, LCSW ?Phone Number: ?04/06/2021, 9:55 AM ? ?Clinical Narrative:   [facility] supervisor working with pt on facility choice.  Auth request submitted in [place], facility pending.  ? ?1100: TOC supervisor Barbette Or met with pt, has agreed to wait until noon to hear back from two other potential facilities.   ? ?1300: pt has agreed to accept offer at [facility].  [facility] informed and can accept pt today. ?1400: auth approved in [place]: [id number], 3 days: 3/22-3/24. ? ?Expected Discharge Plan: [place] ?Barriers to Discharge: Continued Medical Work up ? ?Expected Discharge Plan and Services ?Expected Discharge Plan: [place] ?  ?  ?  ?Living arrangements for the past 2 months: Apartment ?Expected Discharge Date: 04/06/21               ?  ?  ?  ?  ?  ?  ?  ?  ?  ?  ? ? ?Social Determinants of Health (SDOH) Interventions ?  ? ?Readmission Risk Interventions ? ?  07/26/2020  ?  2:21 PM 02/23/2020  ?  1:16 PM  ?Readmission Risk Prevention Plan  ?Transportation Screening Complete Complete  ?Medication Review Press photographer) Complete Complete  ?PCP or Specialist appointment within 3-5 days of discharge Complete Complete  ?[place] or Home Care Consult Complete Complete  ?SW Recovery Care/Counseling Consult Complete Complete  ?Palliative Care Screening Not Applicable Complete  ?Skilled Nursing Facility Complete Complete  ? ? ?

## 2021-04-06 NOTE — TOC Transition Note (Signed)
Transition of Care (TOC) - CM/SW Discharge Note ? ? ?Patient Details  ?Name: Tiffany [name] ?MRN: [id number] ?Date of Birth: 1960-10-22 ? ?Transition of Care [facility]) CM/SW Contact:  ?Joanne Chars, LCSW ?Phone Number: ?04/06/2021, 2:12 PM ? ? ?Clinical Narrative:   Pt discharging to [facility]. RN call report to [phone]. ? ? ? ?Final next level of care: [place] ?Barriers to Discharge: Barriers Resolved ? ? ?Patient Goals and CMS Choice ?Patient states their goals for this hospitalization and ongoing recovery are:: to get stronger ?CMS Medicare.gov Compare Post Acute Care list provided to:: Patient ?Choice offered to / list presented to : Patient ? ?Discharge Placement ?  ?           ?Patient chooses bed at: [facility] ?Patient to be transferred to facility by: ptar ?Name of family member notified: left message with daughter [name] ?Patient and family notified of of transfer: 04/06/21 ? ?Discharge Plan and Services ?  ?  ?           ?  ?  ?  ?  ?  ?  ?  ?  ?  ?  ? ?Social Determinants of Health (SDOH) Interventions ?  ? ? ?Readmission Risk Interventions ? ?  07/26/2020  ?  2:21 PM 02/23/2020  ?  1:16 PM  ?Readmission Risk Prevention Plan  ?Transportation Screening Complete Complete  ?Medication Review Press photographer) Complete Complete  ?PCP or Specialist appointment within 3-5 days of discharge Complete Complete  ?[place] or Home Care Consult Complete Complete  ?SW Recovery Care/Counseling Consult Complete Complete  ?Palliative Care Screening Not Applicable Complete  ?Skilled Nursing Facility Complete Complete  ? ? ? ? ? ?

## 2021-04-06 NOTE — Plan of Care (Signed)
?  Problem: Acute Rehab PT Goals(only PT should resolve) ?Goal: Pt will Roll Supine to Side ?Outcome: Completed/Met ?Goal: Pt Will Go Supine/Side To Sit ?Outcome: Completed/Met ?Goal: Pt Will Go Sit To Supine/Side ?Outcome: Completed/Met ?Goal: Patient Will Transfer Sit To/From Stand ?Outcome: Completed/Met ?Goal: Pt Will Ambulate ?Outcome: Completed/Met ?  ?Problem: Education: ?Goal: Knowledge of General Education information will improve ?Description: Including pain rating scale, medication(s)/side effects and non-pharmacologic comfort measures ?04/06/2021 2312 by Morene Rankins, LPN ?Outcome: Completed/Met ?04/06/2021 1957 by Morene Rankins, LPN ?Outcome: Adequate for Discharge ?  ?Problem: Health Behavior/Discharge Planning: ?Goal: Ability to manage health-related needs will improve ?04/06/2021 2312 by Morene Rankins, LPN ?Outcome: Completed/Met ?04/06/2021 1957 by Morene Rankins, LPN ?Outcome: Adequate for Discharge ?  ?Problem: Clinical Measurements: ?Goal: Ability to maintain clinical measurements within normal limits will improve ?04/06/2021 2312 by Morene Rankins, LPN ?Outcome: Completed/Met ?04/06/2021 1957 by Morene Rankins, LPN ?Outcome: Adequate for Discharge ?Goal: Will remain free from infection ?04/06/2021 2312 by Morene Rankins, LPN ?Outcome: Completed/Met ?04/06/2021 1957 by Morene Rankins, LPN ?Outcome: Adequate for Discharge ?Goal: Diagnostic test results will improve ?04/06/2021 2312 by Morene Rankins, LPN ?Outcome: Completed/Met ?04/06/2021 1957 by Morene Rankins, LPN ?Outcome: Adequate for Discharge ?Goal: Respiratory complications will improve ?04/06/2021 2312 by Morene Rankins, LPN ?Outcome: Completed/Met ?04/06/2021 1957 by Morene Rankins, LPN ?Outcome: Adequate for Discharge ?Goal: Cardiovascular complication will be avoided ?04/06/2021 2312 by Morene Rankins, LPN ?Outcome: Completed/Met ?04/06/2021 1957 by Morene Rankins, LPN ?Outcome: Adequate for Discharge ?  ?Problem:  Activity: ?Goal: Risk for activity intolerance will decrease ?04/06/2021 2312 by Morene Rankins, LPN ?Outcome: Completed/Met ?04/06/2021 1957 by Morene Rankins, LPN ?Outcome: Adequate for Discharge ?  ?Problem: Nutrition: ?Goal: Adequate nutrition will be maintained ?04/06/2021 2312 by Morene Rankins, LPN ?Outcome: Completed/Met ?04/06/2021 1957 by Morene Rankins, LPN ?Outcome: Adequate for Discharge ?  ?Problem: Coping: ?Goal: Level of anxiety will decrease ?04/06/2021 2312 by Morene Rankins, LPN ?Outcome: Completed/Met ?04/06/2021 1957 by Morene Rankins, LPN ?Outcome: Adequate for Discharge ?  ?Problem: Elimination: ?Goal: Will not experience complications related to bowel motility ?04/06/2021 2312 by Morene Rankins, LPN ?Outcome: Completed/Met ?04/06/2021 1957 by Morene Rankins, LPN ?Outcome: Adequate for Discharge ?Goal: Will not experience complications related to urinary retention ?04/06/2021 2312 by Morene Rankins, LPN ?Outcome: Completed/Met ?04/06/2021 1957 by Morene Rankins, LPN ?Outcome: Adequate for Discharge ?  ?Problem: Pain Managment: ?Goal: General experience of comfort will improve ?04/06/2021 2312 by Morene Rankins, LPN ?Outcome: Completed/Met ?04/06/2021 1957 by Morene Rankins, LPN ?Outcome: Adequate for Discharge ?  ?Problem: Safety: ?Goal: Ability to remain free from injury will improve ?04/06/2021 2312 by Morene Rankins, LPN ?Outcome: Completed/Met ?04/06/2021 1957 by Morene Rankins, LPN ?Outcome: Adequate for Discharge ?  ?Problem: Skin Integrity: ?Goal: Risk for impaired skin integrity will decrease ?04/06/2021 2312 by Morene Rankins, LPN ?Outcome: Completed/Met ?04/06/2021 1957 by Morene Rankins, LPN ?Outcome: Adequate for Discharge ?  ?Problem: Acute Rehab OT Goals (only OT should resolve) ?Goal: Pt. Will Perform Lower Body Bathing ?Outcome: Completed/Met ?Goal: Pt. Will Perform Lower Body Dressing ?Outcome: Completed/Met ?Goal: Pt. Will Transfer To Toilet ?Outcome:  Completed/Met ?Goal: Pt/Caregiver Will Perform Home Exercise Program ?Outcome: Completed/Met ?  ?

## 2021-04-06 NOTE — Progress Notes (Signed)
Physical Therapy Treatment ?Patient Details ?Name: Tiffany [name] ?MRN: [id number] ?DOB: 09/30/1960 ?Today's Date: 04/06/2021 ? ? ?History of Present Illness Pt is a 61 y.o. female admitted 03/28/21 with BLE swelling, pain and difficulty walking; of note, multiple ER visits for BLE swelling. Course complicated by rectal bleeding, anemia. S/p colonoscopy 3/17 with blood in the colon, but no source found. PMH includes anoxic brain injury post-PEA arrest (2018), HF, COPD, HTN, DM2, seizure, PTSD, schizoaffective disorder. ?  ?PT Comments  ? ? Pt slowly progressing with mobility. Today's session focused on seated EOB activities and standing trials, limited by c/o increased RLE pain this session. Pt remains limited by generalized weakness, decreased activity tolerance, and impaired balance strategies/postural reactions. Continue to recommend SNF-level therapies to maximize functional mobility and independence prior to return home. ?   ?Recommendations for follow up therapy are one component of a multi-disciplinary discharge planning process, led by the attending physician.  Recommendations may be updated based on patient status, additional functional criteria and insurance authorization. ? ?Follow Up Recommendations ? Skilled nursing-short term rehab (<3 hours/day) ?  ?  ?Assistance Recommended at Discharge Frequent or constant Supervision/Assistance  ?Patient can return home with the following Two people to help with walking and/or transfers;A lot of help with bathing/dressing/bathroom;Assistance with cooking/housework;Assistance with feeding;Direct supervision/assist for medications management;Direct supervision/assist for financial management;Assist for transportation;Help with stairs or ramp for entrance ?  ?Equipment Recommendations ? BSC/3in1;Wheelchair (measurements PT);Wheelchair cushion (measurements PT)  ?  ?Recommendations for Other Services   ? ? ?  ?Precautions / Restrictions Precautions ?Precautions:  Fall;Other (comment) ?Precaution Comments: urinary incontinence/frequency ?Restrictions ?Weight Bearing Restrictions: No  ?  ? ?Mobility ? Bed Mobility ?Overal bed mobility: Needs Assistance ?Bed Mobility: Supine to Sit, Sit to Supine ?  ?  ?Supine to sit: Supervision, HOB elevated ?  ?  ?General bed mobility comments: increased time and effort, heavy use of bed rail, HOB elevated ?  ? ?Transfers ?Overall transfer level: Needs assistance ?Equipment used: Rolling walker (2 wheels) ?Transfers: Sit to/from Stand ?Sit to Stand: Mod assist, From elevated surface ?  ?  ?  ?  ? Lateral/Scoot Transfers: Min guard ?General transfer comment: cues for sequencing and hand placement, modA for trunk elevation and stabilize RW, pt reports significant RLE pain requiring return to sit, declined additional standing; pt able to perform lateral scoot towards [facility] with min guard for blanace ?  ? ?Ambulation/Gait ?  ?  ?  ?  ?  ?  ?  ?  ? ? ?Stairs ?  ?  ?  ?  ?  ? ? ?Wheelchair Mobility ?  ? ?Modified Rankin (Stroke Patients Only) ?  ? ? ?  ?Balance Overall balance assessment: Needs assistance ?Sitting-balance support: No upper extremity supported, Feet supported ?Sitting balance-Leahy Scale: Fair ?  ?  ?Standing balance support: Bilateral upper extremity supported, Reliant on assistive device for balance ?Standing balance-Leahy Scale: Poor ?Standing balance comment: reliant on RW and external assist ?  ?  ?  ?  ?  ?  ?  ?  ?  ?  ?  ?  ? ?  ?Cognition Arousal/Alertness: Awake/alert ?Behavior During Therapy: Flat affect ?Overall Cognitive Status: History of cognitive impairments - at baseline ?  ?  ?  ?  ?  ?  ?  ?  ?  ?  ?  ?  ?  ?  ?  ?  ?General Comments: per chart, h/o anoxic brain injury (2018). no anxious behavior this  session; pleasant and agreeable. increased time to follow commands at times; suspect decreased awareness. appreciative of therapy ?  ?  ? ?  ?Exercises General Exercises - Lower Extremity ?Ankle Circles/Pumps: PROM,  Both, Seated (no active ankle DF noted) ?Long Arc Quad: AROM, Both, Seated ?Hip Flexion/Marching: AROM, Both, Seated (reliant on BUE support to stabilize trunk) ? ?  ?General Comments   ?  ?  ? ?Pertinent Vitals/Pain Pain Assessment ?Pain Assessment: Faces ?Faces Pain Scale: Hurts little more ?Pain Location: RLE with weight bearing ?Pain Descriptors / Indicators: Grimacing, Guarding, Moaning ?Pain Intervention(s): Limited activity within patient's tolerance, Monitored during session  ? ? ?Home Living   ?  ?  ?  ?  ?  ?  ?  ?  ?  ?   ?  ?Prior Function    ?  ?  ?   ? ?PT Goals (current goals can now be found in the care plan section) Progress towards PT goals: Progressing toward goals (slowly) ? ?  ?Frequency ? ? ? Min 2X/week ? ? ? ?  ?PT Plan Current plan remains appropriate  ? ? ?Co-evaluation   ?  ?  ?  ?  ? ?  ?AM-PAC PT "6 Clicks" Mobility   ?Outcome Measure ? Help needed turning from your back to your side while in a flat bed without using bedrails?: A Little ?Help needed moving from lying on your back to sitting on the side of a flat bed without using bedrails?: A Lot ?Help needed moving to and from a bed to a chair (including a wheelchair)?: Total ?Help needed standing up from a chair using your arms (e.g., wheelchair or bedside chair)?: A Lot ?Help needed to walk in hospital room?: Total ?Help needed climbing 3-5 steps with a railing? : Total ?6 Click Score: 10 ? ?  ?End of Session Equipment Utilized During Treatment: Gait belt ?Activity Tolerance: Patient limited by fatigue;Patient limited by pain ?Patient left: in bed;with bed alarm set ?Nurse Communication: Mobility status ?PT Visit Diagnosis: Unsteadiness on feet (R26.81);Muscle weakness (generalized) (M62.81);History of falling (Z91.81);Difficulty in walking, not elsewhere classified (R26.2);Pain ?Pain - Right/Left: Right ?Pain - part of body: Leg ?  ? ? ?Time: 2330-0762 ?PT Time Calculation (min) (ACUTE ONLY): 22 min ? ?Charges:  $Therapeutic  Activity: 8-22 mins          ?          ? ?Mabeline Caras, PT, DPT ?Acute Rehabilitation Services  ?Pager [phone] ?Office [phone] ? ?Derry Lory ?04/06/2021, 4:23 PM ? ?

## 2021-04-06 NOTE — Plan of Care (Signed)

## 2021-04-07 ENCOUNTER — Emergency Department (HOSPITAL_COMMUNITY)
Admission: EM | Admit: 2021-04-07 | Discharge: 2021-04-07 | Disposition: A | Discharge status: Home / Self Care | Payer: Medicare Other | Attending: Emergency Medicine | Admitting: Emergency Medicine

## 2021-04-07 ENCOUNTER — Encounter (HOSPITAL_COMMUNITY): Payer: Self-pay | Admitting: Pharmacy Technician

## 2021-04-07 DIAGNOSIS — R1084 Generalized abdominal pain: Secondary | ICD-10-CM | POA: Diagnosis not present

## 2021-04-07 DIAGNOSIS — R6889 Other general symptoms and signs: Secondary | ICD-10-CM | POA: Diagnosis not present

## 2021-04-07 DIAGNOSIS — R109 Unspecified abdominal pain: Secondary | ICD-10-CM | POA: Diagnosis not present

## 2021-04-07 DIAGNOSIS — R0789 Other chest pain: Secondary | ICD-10-CM | POA: Diagnosis not present

## 2021-04-07 DIAGNOSIS — R519 Headache, unspecified: Secondary | ICD-10-CM | POA: Insufficient documentation

## 2021-04-07 DIAGNOSIS — Z743 Need for continuous supervision: Secondary | ICD-10-CM | POA: Diagnosis not present

## 2021-04-07 DIAGNOSIS — R079 Chest pain, unspecified: Secondary | ICD-10-CM | POA: Diagnosis not present

## 2021-04-07 LAB — CBC WITH DIFFERENTIAL/PLATELET
Abs Immature Granulocytes: 0.04 10*3/uL (ref 0.00–0.07)
Basophils Absolute: 0 10*3/uL (ref 0.0–0.1)
Basophils Relative: 1 %
Eosinophils Absolute: 0.1 10*3/uL (ref 0.0–0.5)
Eosinophils Relative: 1 %
HCT: 31.5 % — ABNORMAL LOW (ref 36.0–46.0)
Hemoglobin: 10.1 g/dL — ABNORMAL LOW (ref 12.0–15.0)
Immature Granulocytes: 1 %
Lymphocytes Relative: 15 %
Lymphs Abs: 1.3 10*3/uL (ref 0.7–4.0)
MCH: 30 pg (ref 26.0–34.0)
MCHC: 32.1 g/dL (ref 30.0–36.0)
MCV: 93.5 fL (ref 80.0–100.0)
Monocytes Absolute: 0.4 10*3/uL (ref 0.1–1.0)
Monocytes Relative: 4 %
Neutro Abs: 6.5 10*3/uL (ref 1.7–7.7)
Neutrophils Relative %: 78 %
Platelets: 351 10*3/uL (ref 150–400)
RBC: 3.37 MIL/uL — ABNORMAL LOW (ref 3.87–5.11)
RDW: 15.6 % — ABNORMAL HIGH (ref 11.5–15.5)
WBC: 8.3 10*3/uL (ref 4.0–10.5)
nRBC: 0 % (ref 0.0–0.2)

## 2021-04-07 LAB — COMPREHENSIVE METABOLIC PANEL
ALT: 16 U/L (ref 0–44)
AST: 18 U/L (ref 15–41)
Albumin: 3.3 g/dL — ABNORMAL LOW (ref 3.5–5.0)
Alkaline Phosphatase: 137 U/L — ABNORMAL HIGH (ref 38–126)
Anion gap: 8 (ref 5–15)
BUN: 28 mg/dL — ABNORMAL HIGH (ref 8–23)
CO2: 28 mmol/L (ref 22–32)
Calcium: 9.5 mg/dL (ref 8.9–10.3)
Chloride: 103 mmol/L (ref 98–111)
Creatinine, Ser: 1.48 mg/dL — ABNORMAL HIGH (ref 0.44–1.00)
GFR, Estimated: 40 mL/min — ABNORMAL LOW (ref >60)
Glucose, Bld: 94 mg/dL (ref 70–99)
Potassium: 4.3 mmol/L (ref 3.5–5.1)
Sodium: 139 mmol/L (ref 135–145)
Total Bilirubin: 0.6 mg/dL (ref 0.3–1.2)
Total Protein: 5.8 g/dL — ABNORMAL LOW (ref 6.5–8.1)

## 2021-04-07 LAB — LIPASE, BLOOD: Lipase: 30 U/L (ref 11–51)

## 2021-04-07 NOTE — ED Provider Notes (Signed)
?[facility] ?Provider Note ? ? ?CSN: [id number] ?Arrival date & time: 04/07/21  1351 ? ?  ? ?History ? ?Chief Complaint  ?Patient presents with  ? Chest Pain  ? Abdominal Pain  ? ? ?ALIANYS CHACKO is a 61 y.o. female. ? ?61 yo F with a chief complaints of not liking the place in which she lives.  The patient was just in the hospital for GI bleeding and was discharged to a skilled nursing facility.  She was concerned that when she got there in the news she was and did not know it was going on with her and when she told them that her abdomen was hurting and she felt she had a headache she did not feel like she was appropriately evaluated and decided to come here.  She tells me that she would rather go home and have home health come and help her out.  She needs to get in touch with her doctor so they can try and schedule this for her. ? ? ?Chest Pain ?Associated symptoms: abdominal pain   ?Abdominal Pain ?Associated symptoms: chest pain   ? ?  ? ?Home Medications ?Prior to Admission medications   ?Medication Sig Start Date End Date Taking? Authorizing Provider  ?albuterol (PROVENTIL) (2.5 MG/3ML) 0.083% nebulizer solution Take 3 mLs (2.5 mg total) by nebulization every 6 (six) hours as needed for wheezing or shortness of breath. 12/16/20   [name], MD  ?albuterol (VENTOLIN HFA) 108 (90 Base) MCG/ACT inhaler Inhale 2 puffs into the lungs every 6 (six) hours as needed for wheezing or shortness of breath. 02/09/21   [name], MD  ?amLODipine (NORVASC) 10 MG tablet TAKE 1 TABLET(10 MG) BY MOUTH DAILY ?Patient taking differently: Take 10 mg by mouth every morning. 03/16/21   [name], MD  ?Asenapine Maleate 10 MG SUBL Place 1 tablet (10 mg total) under the tongue 2 (two) times daily. 04/06/21   Shelly Coss, MD  ?atorvastatin (LIPITOR) 20 MG tablet Take 1 tablet (20 mg total) by mouth at bedtime. 01/14/21   Mercy Riding, MD  ?colchicine 0.6 MG tablet Take 2 tablets  by mouth at the onset of gout flare, may repeat 1 tablet in 1 hour if symptoms persist ?Patient taking differently: Take 0.6 mg by mouth every morning. 01/14/21   Mercy Riding, MD  ?diazepam (VALIUM) 5 MG tablet Take 1 tablet (5 mg total) by mouth daily as needed for anxiety. 04/06/21   Shelly Coss, MD  ?diclofenac Sodium (VOLTAREN) 1 % GEL Apply 4 g topically 4 (four) times daily. ?Patient taking differently: Apply 4 g topically 4 (four) times daily as needed (pain). 03/01/21   [name], MD  ?dicyclomine (BENTYL) 10 MG capsule Take 1 capsule (10 mg total) by mouth 3 (three) times daily before meals. 12/24/20   [name], MD  ?DULoxetine (CYMBALTA) 30 MG capsule Take 30 mg by mouth daily. 03/22/21   [provider]  ?escitalopram (LEXAPRO) 20 MG tablet Take 20 mg by mouth every morning.    [provider]  ?Fluticasone-Umeclidin-Vilant (TRELEGY ELLIPTA) 100-62.5-25 MCG/ACT AEPB Inhale 1 puff into the lungs daily. 01/14/21   Mercy Riding, MD  ?gabapentin (NEURONTIN) 600 MG tablet Take 1 tablet (600 mg total) by mouth 2 (two) times daily. 02/09/21   [name], MD  ?hydrOXYzine (ATARAX) 25 MG tablet Take 1 tablet (25 mg total) by mouth every 8 (eight) hours as needed. For anxiety 04/06/21   Adhikari,  Amrit, MD  ?Iron, Ferrous Sulfate, 325 (65 Fe) MG TABS Take 325 mg by mouth daily. 01/14/21   Mercy Riding, MD  ?ketoconazole (NIZORAL) 2 % cream Apply 1 application. topically daily. Apply to finger nails 09/27/20   [provider]  ?lisdexamfetamine (VYVANSE) 10 MG capsule Take 1 capsule (10 mg total) by mouth daily at 12 noon. 04/06/21   Shelly Coss, MD  ?methocarbamol (ROBAXIN) 500 MG tablet Take 1 tablet (500 mg total) by mouth every 6 (six) hours as needed for muscle spasms. 04/06/21   Shelly Coss, MD  ?montelukast (SINGULAIR) 10 MG tablet Take 1 tablet (10 mg total) by mouth at bedtime. 01/14/21   Mercy Riding, MD  ?Multiple Vitamin (MULTIVITAMIN WITH MINERALS)  TABS tablet Take 1 tablet by mouth daily. 01/14/21   Mercy Riding, MD  ?omeprazole (PRILOSEC) 40 MG capsule TAKE 1 CAPSULE(40 MG) BY MOUTH DAILY ?Patient taking differently: Take 40 mg by mouth every morning. 10/23/20   [name], MD  ?oxybutynin (DITROPAN) 5 MG tablet TAKE 1 TABLET(5 MG) BY MOUTH TWICE DAILY ?Patient taking differently: Take 5 mg by mouth 2 (two) times daily. 10/23/20   [name], MD  ?potassium chloride SA (KLOR-CON M) 20 MEQ tablet Take 1 tablet (20 mEq total) by mouth daily. ?Patient taking differently: Take 40 mEq by mouth 2 (two) times daily. 01/17/21 03/30/21  Mercy Riding, MD  ?prazosin (MINIPRESS) 2 MG capsule Take 1 capsule (2 mg total) by mouth at bedtime. 02/14/21   [name], MD  ?sodium chloride (OCEAN) 0.65 % SOLN nasal spray Place 2 sprays into both nostrils daily.    [provider]  ?terbinafine (LAMISIL AT) 1 % cream Apply 1 application topically 2 (two) times daily. ?Patient taking differently: Apply 1 application. topically 3 (three) times daily. Apply to feet 09/07/20   [name], MD  ?thiamine 100 MG tablet Take 1 tablet (100 mg total) by mouth daily. ?Patient not taking: Reported on 03/30/2021 02/27/20   Modena Jansky, MD  ?torsemide (DEMADEX) 20 MG tablet Take 1 tablet (20 mg total) by mouth daily. ?Patient not taking: Reported on 03/30/2021 02/25/21   Margarita Mail, PA-C  ?   ? ?Allergies    ?Hydrocodone, Latuda [lurasidone hcl], Magnesium-containing compounds, Prednisone, Tramadol, Codeine, Trazodone, Topamax [topiramate], Sulfa antibiotics, and Tape   ? ?Review of Systems   ?Review of Systems  ?Cardiovascular:  Positive for chest pain.  ?Gastrointestinal:  Positive for abdominal pain.  ? ?Physical Exam ?Updated Vital Signs ?BP (!) 167/101 (BP Location: Left Arm)   Pulse 88   Temp 99.1 ?F (37.3 ?C) (Oral)   Resp 16   SpO2 97%  ?Physical Exam ?Vitals and nursing note reviewed.  ?Constitutional:   ?   General: [name] not in acute distress. ?    Appearance: [name] well-developed. [name] not diaphoretic.  ?HENT:  ?   Head: Normocephalic and atraumatic.  ?Eyes:  ?   Pupils: Pupils are equal, round, and reactive to light.  ?Cardiovascular:  ?   Rate and Rhythm: Normal rate and regular rhythm.  ?   Heart sounds: No murmur heard. ?  No friction rub. No gallop.  ?Pulmonary:  ?   Effort: Pulmonary effort is normal.  ?   Breath sounds: No wheezing or rales.  ?Abdominal:  ?   General: There is no distension.  ?   Palpations: Abdomen is soft.  ?   Tenderness: There is no abdominal tenderness.  ?Musculoskeletal:     ?  General: No tenderness.  ?   Cervical back: Normal range of motion and neck supple.  ?Skin: ?   General: Skin is warm and dry.  ?Neurological:  ?   Mental Status: [name] alert and oriented to person, place, and time.  ?Psychiatric:     ?   Behavior: Behavior normal.  ? ? ?ED Results / Procedures / Treatments   ?Labs ?(all labs ordered are listed, but only abnormal results are displayed) ?Labs Reviewed  ?CBC WITH DIFFERENTIAL/PLATELET - Abnormal; Notable for the following components:  ?    Result Value  ? RBC 3.37 (*)   ? Hemoglobin 10.1 (*)   ? HCT 31.5 (*)   ? RDW 15.6 (*)   ? All other components within normal limits  ?COMPREHENSIVE METABOLIC PANEL - Abnormal; Notable for the following components:  ? BUN 28 (*)   ? Creatinine, Ser 1.48 (*)   ? Total Protein 5.8 (*)   ? Albumin 3.3 (*)   ? Alkaline Phosphatase 137 (*)   ? GFR, Estimated 40 (*)   ? All other components within normal limits  ?LIPASE, BLOOD  ? ? ?EKG ?None ? ?Radiology ?No results found. ? ?Procedures ?Procedures  ? ? ?Medications Ordered in ED ?Medications - No data to display ? ?ED Course/ Medical Decision Making/ A&P ?  ?                        ?Medical Decision Making ? ?30 yoF with a significant past medical history of a recent GI bleed requiring hospitalization and 7 units of blood is unhappy with the skilled nursing facility in which she was placed.  [name] complaining of some  abdominal discomfort though has no abdominal discomfort on exam.  Complaining of a headache but does not appear to be in any distress.  [name] well-appearing and nontoxic.  Is no focal neurologic deficit.

## 2021-04-07 NOTE — Care Management (Signed)
ED RNCM  consulted concerning patient who was discharged this morning to [facility] SNF and signed out AMA.  Patient is requesting [facility] services, met with patient to disucss HH she is agreeable. Patient is active with [facility] Medicare.  RNCM explained that we would need to send the referral out to several [facility] agencies , patient verbalized understanding. Referral was sent to [facility].  CM also explained that since it falls on the weekend she may not receive a call until possibly  Monday 3/27 patient states that would be ok.  ED CM will follow up with patient Monday evening.  ?

## 2021-04-07 NOTE — Discharge Instructions (Signed)
Return for recurrent bleeding ?

## 2021-04-07 NOTE — ED Provider Triage Note (Signed)
Emergency Medicine Provider Triage Evaluation Note ? ?Tiffany [name] , a 61 y.o. female  was evaluated in triage.  Pt complains of ongoing abdominal pain.  Patient was just discharged from the hospital last night.  She states that she went to [facility] and feels like they have not been taking care of her today.  She states that she wants to go home and have rehab instead.  She states that she had worsening abdominal pain prompting EMS call. ? ?Review of Systems  ?Positive: + abd pain ?Negative: - nausea, vomiting ? ?Physical Exam  ?BP (!) 167/101 (BP Location: Left Arm)   Pulse 88   Temp 99.1 ?F (37.3 ?C) (Oral)   Resp 16   SpO2 97%  ?Gen:   Awake, no distress   ?Resp:  Normal effort  ?MSK:   Moves extremities without difficulty  ?Other:  Abd soft ? ?Medical Decision Making  ?Medically screening exam initiated at 1:58 PM.  Appropriate orders placed.  Tiffany [name] was informed that the remainder of the evaluation will be completed by another provider, this initial triage assessment does not replace that evaluation, and the importance of remaining in the ED until their evaluation is complete. ? ? ?  ?Eustaquio Maize, PA-C ?04/07/21 1400 ? ?

## 2021-04-07 NOTE — ED Triage Notes (Signed)
Pt bib ems from [facility] with reports of ongoing chest pain and abdominal pain. VSS. EKG unremarkable.  ?

## 2021-04-08 ENCOUNTER — Ambulatory Visit: Payer: Medicaid Other | Admitting: Obstetrics and Gynecology

## 2021-04-08 DIAGNOSIS — Z0289 Encounter for other administrative examinations: Secondary | ICD-10-CM

## 2021-04-12 ENCOUNTER — Other Ambulatory Visit: Payer: Self-pay | Admitting: Family Medicine

## 2021-04-14 DIAGNOSIS — R29898 Other symptoms and signs involving the musculoskeletal system: Secondary | ICD-10-CM | POA: Diagnosis not present

## 2021-04-16 ENCOUNTER — Other Ambulatory Visit: Payer: Self-pay | Admitting: Family Medicine

## 2021-04-16 DIAGNOSIS — G629 Polyneuropathy, unspecified: Secondary | ICD-10-CM

## 2021-04-18 DIAGNOSIS — D508 Other iron deficiency anemias: Secondary | ICD-10-CM | POA: Diagnosis not present

## 2021-04-18 DIAGNOSIS — R269 Unspecified abnormalities of gait and mobility: Secondary | ICD-10-CM | POA: Diagnosis not present

## 2021-04-18 DIAGNOSIS — N189 Chronic kidney disease, unspecified: Secondary | ICD-10-CM | POA: Diagnosis not present

## 2021-04-18 DIAGNOSIS — I10 Essential (primary) hypertension: Secondary | ICD-10-CM | POA: Diagnosis not present

## 2021-04-18 DIAGNOSIS — I509 Heart failure, unspecified: Secondary | ICD-10-CM | POA: Diagnosis not present

## 2021-04-18 DIAGNOSIS — J42 Unspecified chronic bronchitis: Secondary | ICD-10-CM | POA: Diagnosis not present

## 2021-04-18 DIAGNOSIS — E119 Type 2 diabetes mellitus without complications: Secondary | ICD-10-CM | POA: Diagnosis not present

## 2021-04-18 DIAGNOSIS — E782 Mixed hyperlipidemia: Secondary | ICD-10-CM | POA: Diagnosis not present

## 2021-04-18 DIAGNOSIS — M79606 Pain in leg, unspecified: Secondary | ICD-10-CM | POA: Diagnosis not present

## 2021-04-18 DIAGNOSIS — I779 Disorder of arteries and arterioles, unspecified: Secondary | ICD-10-CM | POA: Diagnosis not present

## 2021-04-18 NOTE — Telephone Encounter (Signed)
Requested medication (s) are due for refill today: yes ? ?Requested medication (s) are on the active medication list: yes ? ?Last refill:  02/09/21 #60 with 1 RF ? ?Future visit scheduled: no, canceled tomorrows visit ? ?Notes to clinic:  There is a new PCP listed that is not with this practice, please assess if [facility] still to prescribe for her. ? ? ?  ? ?Requested Prescriptions  ?Pending Prescriptions Disp Refills  ? gabapentin (NEURONTIN) 600 MG tablet [Pharmacy Med Name: GABAPENTIN '600MG'$  TABLETS] 60 tablet 1  ?  Sig: TAKE 1 TABLET(600 MG) BY MOUTH TWICE DAILY  ?  ? Neurology: Anticonvulsants - gabapentin Failed - 04/16/2021 12:06 PM  ?  ?  Failed - Cr in normal range and within 360 days  ?  Creat  ?Date Value Ref Range Status  ?02/18/2016 1.22 (H) 0.50 - 1.05 mg/dL Final  ?  Comment:  ?    ?For patients > or = 62 years of age: The upper reference limit for ?Creatinine is approximately 13% higher for people identified as ?African-American. ?  ?  ? ?Creatinine, Ser  ?Date Value Ref Range Status  ?04/07/2021 1.48 (H) 0.44 - 1.00 mg/dL Final  ? ?Creatinine,U  ?Date Value Ref Range Status  ?06/17/2012 521.94 mg/dL Final  ?  Comment:  ?  Result confirmed by automatic dilution. ?  ? Cutoff Values for Urine Drug Screen, Pain Mgmt ?         Drug Class           Cutoff (ng/mL) ?         Amphetamines             500 ?         Barbiturates             200 ?         Cocaine Metabolites      150 ?         Benzodiazepines          200 ?         Methadone                300 ?         Opiates                  300 ?         Phencyclidine             25 ?         Propoxyphene             300 ?         Marijuana Metabolites     50 ?  ? For medical purposes only.  ? ?Creatinine, Urine  ?Date Value Ref Range Status  ?02/19/2020 300.84 mg/dL Final  ?  Comment:  ?  Performed at [facility], [place] [address] [place], [place] [address]  ?  ?  ?  ?  Passed - Completed PHQ-2 or PHQ-9 in the last 360 days  ?  ?  Passed - Valid  encounter within last 12 months  ?  Recent Outpatient Visits   ? ?      ? 3 weeks ago Weakness of both lower extremities  ? [place], Charlane Ferretti, MD  ? 1 month ago Edema of both legs  ? [place], Vernia Buff, NP  ? 2 months  ago Hospital discharge follow-up  ? [place], [place] W, NP  ? 3 months ago Pain in both lower extremities  ? [place], [place], MD  ? 4 months ago Adnexal cyst  ? [place], Enobong, MD  ? ?  ?  ? ?  ?  ?  ? ? ?

## 2021-04-19 ENCOUNTER — Ambulatory Visit: Payer: Medicare Other | Attending: Family Medicine | Admitting: Family Medicine

## 2021-04-19 ENCOUNTER — Ambulatory Visit: Payer: Commercial Managed Care - HMO | Admitting: Family Medicine

## 2021-04-19 ENCOUNTER — Ambulatory Visit: Payer: Self-pay

## 2021-04-19 ENCOUNTER — Emergency Department (HOSPITAL_COMMUNITY)
Admission: EM | Admit: 2021-04-19 | Discharge: 2021-04-21 | Disposition: A | Discharge status: Home / Self Care | Payer: Medicare Other | Attending: Emergency Medicine | Admitting: Emergency Medicine

## 2021-04-19 DIAGNOSIS — R29898 Other symptoms and signs involving the musculoskeletal system: Secondary | ICD-10-CM | POA: Diagnosis not present

## 2021-04-19 DIAGNOSIS — M79604 Pain in right leg: Secondary | ICD-10-CM | POA: Diagnosis not present

## 2021-04-19 DIAGNOSIS — M5416 Radiculopathy, lumbar region: Secondary | ICD-10-CM

## 2021-04-19 DIAGNOSIS — Z743 Need for continuous supervision: Secondary | ICD-10-CM | POA: Diagnosis not present

## 2021-04-19 DIAGNOSIS — G894 Chronic pain syndrome: Secondary | ICD-10-CM

## 2021-04-19 DIAGNOSIS — R600 Localized edema: Secondary | ICD-10-CM | POA: Diagnosis not present

## 2021-04-19 DIAGNOSIS — I10 Essential (primary) hypertension: Secondary | ICD-10-CM | POA: Diagnosis not present

## 2021-04-19 DIAGNOSIS — E876 Hypokalemia: Secondary | ICD-10-CM | POA: Diagnosis not present

## 2021-04-19 DIAGNOSIS — R1030 Lower abdominal pain, unspecified: Secondary | ICD-10-CM | POA: Diagnosis not present

## 2021-04-19 DIAGNOSIS — Z79899 Other long term (current) drug therapy: Secondary | ICD-10-CM | POA: Diagnosis not present

## 2021-04-19 DIAGNOSIS — M79661 Pain in right lower leg: Secondary | ICD-10-CM | POA: Diagnosis not present

## 2021-04-19 DIAGNOSIS — M79662 Pain in left lower leg: Secondary | ICD-10-CM | POA: Insufficient documentation

## 2021-04-19 DIAGNOSIS — R109 Unspecified abdominal pain: Secondary | ICD-10-CM | POA: Diagnosis not present

## 2021-04-19 DIAGNOSIS — F250 Schizoaffective disorder, bipolar type: Secondary | ICD-10-CM

## 2021-04-19 DIAGNOSIS — R531 Weakness: Secondary | ICD-10-CM | POA: Diagnosis not present

## 2021-04-19 DIAGNOSIS — R6889 Other general symptoms and signs: Secondary | ICD-10-CM | POA: Diagnosis not present

## 2021-04-19 DIAGNOSIS — M79605 Pain in left leg: Secondary | ICD-10-CM | POA: Diagnosis not present

## 2021-04-19 MED ORDER — TORSEMIDE 20 MG PO TABS: 20.0000 mg | ORAL_TABLET | Freq: Every day | ORAL | 3 refills | Status: DC

## 2021-04-19 MED ORDER — CARVEDILOL 6.25 MG PO TABS: 6.2500 mg | ORAL_TABLET | Freq: Two times a day (BID) | ORAL | 3 refills | Status: DC

## 2021-04-19 MED ORDER — METHOCARBAMOL 500 MG PO TABS: 500.0000 mg | ORAL_TABLET | Freq: Four times a day (QID) | ORAL | 3 refills | Status: DC | PRN

## 2021-04-19 MED ORDER — DICLOFENAC SODIUM 1 % EX GEL: 4.0000 g | Freq: Four times a day (QID) | CUTANEOUS | 3 refills | Status: DC | PRN

## 2021-04-19 NOTE — ED Triage Notes (Signed)
Pt arrives to ED BIB [facility] c/o leg pain x1 month.  ? ?BP 160/90 ?HR 90 ?CBG 116 ?
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
## 2021-04-19 NOTE — Telephone Encounter (Signed)
?  Chief Complaint: Abdominal Pain ?Symptoms: Pain of 10/10 in her abdomen - pt thinks it's a cyst on her ovaries or uterus ?Frequency: Since this morning ?Pertinent Negatives: Patient denies Fever, vomiting. ?Disposition: '[x]'$ ED /'[]'$ Urgent Care (no appt availability in office) / '[]'$ Appointment(In office/virtual)/ '[]'$  [facility]/ '[]'$ Home Care/ '[]'$ Refused Recommended Disposition /'[]'$ [facility]/ '[]'$  Follow-up with PCP ?Additional Notes: Pt has not ride to ED so she will call 911 for assistance to ED. ? ? ?Reason for Disposition ? [1] SEVERE pain AND [2] age > 32 years ? ?Answer Assessment - Initial Assessment Questions ?1. LOCATION: "Where does it hurt?"  ?    Uterus - or fallopian tubes ?2. RADIATION: "Does the pain shoot anywhere else?" (e.g., chest, back) ?    Along stomach and back ?3. ONSET: "When did the pain begin?" (e.g., minutes, hours or days ago)  ?    This morning ?4. SUDDEN: "Gradual or sudden onset?" ?    Sudden ?5. PATTERN "Does the pain come and go, or is it constant?" ?   - If constant: "Is it getting better, staying the same, or worsening?"  ?    (Note: Constant means the pain never goes away completely; most serious pain is constant and it progresses)  ?   - If intermittent: "How long does it last?" "Do you have pain now?" ?    (Note: Intermittent means the pain goes away completely between bouts) ?    Come and go ?6. SEVERITY: "How bad is the pain?"  (e.g., Scale 1-10; mild, moderate, or severe) ?  - MILD (1-3): doesn't interfere with normal activities, abdomen soft and not tender to touch  ?  - MODERATE (4-7): interferes with normal activities or awakens from sleep, abdomen tender to touch  ?  - SEVERE (8-10): excruciating pain, doubled over, unable to do any normal activities  ?    10/10 ?7. RECURRENT SYMPTOM: "Have you ever had this type of stomach pain before?" If Yes, ask: "When was the last time?" and "What happened that time?"  ?    No ?8. CAUSE: "What do you think is  causing the stomach pain?" ?    Cyst ?9. RELIEVING/AGGRAVATING FACTORS: "What makes it better or worse?" (e.g., movement, antacids, bowel movement) ?    no ?10. OTHER SYMPTOMS: "Do you have any other symptoms?" (e.g., back pain, diarrhea, fever, urination pain, vomiting) ?      no ?11. PREGNANCY: "Is there any chance you are pregnant?" "When was your last menstrual period?" ?      na ? ?Protocols used: Abdominal Pain - Female-A-AH ? ?

## 2021-04-20 ENCOUNTER — Encounter (HOSPITAL_COMMUNITY): Payer: Self-pay

## 2021-04-20 ENCOUNTER — Other Ambulatory Visit: Payer: Self-pay

## 2021-04-20 ENCOUNTER — Telehealth: Payer: Self-pay | Admitting: Family Medicine

## 2021-04-20 DIAGNOSIS — M79661 Pain in right lower leg: Secondary | ICD-10-CM | POA: Diagnosis not present

## 2021-04-20 LAB — BASIC METABOLIC PANEL
Anion gap: 9 (ref 5–15)
BUN: 20 mg/dL (ref 8–23)
CO2: 25 mmol/L (ref 22–32)
Calcium: 9.6 mg/dL (ref 8.9–10.3)
Chloride: 109 mmol/L (ref 98–111)
Creatinine, Ser: 1.58 mg/dL — ABNORMAL HIGH (ref 0.44–1.00)
GFR, Estimated: 37 mL/min — ABNORMAL LOW (ref >60)
Glucose, Bld: 104 mg/dL — ABNORMAL HIGH (ref 70–99)
Potassium: 3.4 mmol/L — ABNORMAL LOW (ref 3.5–5.1)
Sodium: 143 mmol/L (ref 135–145)

## 2021-04-20 LAB — CBC
HCT: 33.9 % — ABNORMAL LOW (ref 36.0–46.0)
Hemoglobin: 10.6 g/dL — ABNORMAL LOW (ref 12.0–15.0)
MCH: 29.1 pg (ref 26.0–34.0)
MCHC: 31.3 g/dL (ref 30.0–36.0)
MCV: 93.1 fL (ref 80.0–100.0)
Platelets: 403 10*3/uL — ABNORMAL HIGH (ref 150–400)
RBC: 3.64 MIL/uL — ABNORMAL LOW (ref 3.87–5.11)
RDW: 15.1 % (ref 11.5–15.5)
WBC: 6.9 10*3/uL (ref 4.0–10.5)
nRBC: 0 % (ref 0.0–0.2)

## 2021-04-20 MED ORDER — HYDROMORPHONE HCL 1 MG/ML IJ SOLN
1.0000 mg | Freq: Once | INTRAMUSCULAR | Status: AC
Start: 2021-04-20 — End: 2021-04-20
  Administered 2021-04-20: 1 mg via INTRAVENOUS
  Filled 2021-04-20: qty 1

## 2021-04-20 MED ORDER — KETOROLAC TROMETHAMINE 15 MG/ML IJ SOLN
15.0000 mg | Freq: Once | INTRAMUSCULAR | Status: AC
  Administered 2021-04-20: 15 mg via INTRAVENOUS
  Filled 2021-04-20: qty 1

## 2021-04-20 MED ORDER — LORAZEPAM 2 MG/ML IJ SOLN
1.0000 mg | Freq: Once | INTRAMUSCULAR | Status: AC
Start: 2021-04-20 — End: 2021-04-20
  Administered 2021-04-20: 1 mg via INTRAVENOUS
  Filled 2021-04-20: qty 1

## 2021-04-20 MED ORDER — GABAPENTIN 300 MG PO CAPS
600.0000 mg | ORAL_CAPSULE | Freq: Three times a day (TID) | ORAL | Status: DC
  Administered 2021-04-20 – 2021-04-21 (×3): 600 mg via ORAL
  Filled 2021-04-20 (×3): qty 2

## 2021-04-20 MED ORDER — CARVEDILOL 3.125 MG PO TABS
6.2500 mg | ORAL_TABLET | Freq: Two times a day (BID) | ORAL | Status: DC
  Administered 2021-04-20 – 2021-04-21 (×3): 6.25 mg via ORAL
  Filled 2021-04-20 (×3): qty 2

## 2021-04-20 MED ORDER — LORAZEPAM 2 MG/ML IJ SOLN
0.5000 mg | Freq: Once | INTRAMUSCULAR | Status: DC
Start: 2021-04-20 — End: 2021-04-20

## 2021-04-20 MED ORDER — HYDROMORPHONE HCL 1 MG/ML IJ SOLN: 1.0000 mg | Freq: Once | INTRAMUSCULAR | Status: DC

## 2021-04-20 NOTE — Telephone Encounter (Signed)
Copied from [place] [id number]. Topic: Quick Communication - Rx Refill/Question >> Apr 19, 2021  4:59 PM Erick Blinks wrote: Pt called to report that she wants something that she can take by mouth/ingest to treat her pain. Please advise  [facility] [place], [place] - [address] AT [address] [address] Phone: [phone] Fax: [phone]

## 2021-04-20 NOTE — Telephone Encounter (Signed)
Pt is currently in the ED.

## 2021-04-20 NOTE — ED Notes (Signed)
Pt. Incontinent of urine full bed change done and peri care given per NT x 2, purewick in place ?

## 2021-04-20 NOTE — Discharge Instructions (Addendum)
Our social worker will look to see if you are eligible for home health.  This process may take a few days, but you will be updated by phone.   ? ?You need to reach out to your primary care provider's clinic about your pain control and rehab. This should be arranged as an outpatient, not through the Emergency Department.   ? ?Please consider scheduling an appointment with a pain management specialist for your chronic pain. I provided a phone number for one pain specialist Dr Davy Pique, but you can also search for your own specialist in the area. ?

## 2021-04-20 NOTE — ED Notes (Signed)
Patient states she needs someone to pick her up and put her on the toilet. As this NT and orientee Benjamine Mola were tring to help her patient states we caused her pants to get wet and starts cussing and yelling at staff. Explained to patient that we were doing our best to help her and that type of language would not be tolerated patient states "you dont tell me how the f* to talk and what the f*to do do. Hurry up and put me in this chair before I really have to get in your as*" triage notified of patient behavior and language.  ?

## 2021-04-20 NOTE — ED Provider Notes (Signed)
?[facility] ?Provider Note ? ? ?CSN: [id number] ?Arrival date & time: 04/19/21  2107 ? ?  ? ?History ? ?Chief Complaint  ?Patient presents with  ? Leg Pain  ? ? ?Tiffany [name] is a 61 y.o. female w/ HTN, chronic leg pain, chronic edema, neuropathy of the lower extremities, presenting to the ED with leg pain.  She presents in the company of her mother.  She reports that she has severe worsening pain in both of her legs.  She takes her torsemide but has chronic edema of the legs.  She says it hurts to walk and she has numbness in both legs. ? ?Patient has been seen in the emergency department 23 times in the past 6 months, often related the pain in her legs.  She was prescribed gabapentin at home for pain, and is also on Cymbalta.  She was also prescribed Atarax and Valium at different times for anxiety. [name] on torsemide 20 mg daily reports compliance with this. ? ?The patient was hospitalized 2 weeks ago, with concern for large GI bleed, requiring 7 units of blood transfusion.  She underwent colonoscopy in March 17 per my review of external records without a clear source of bleeding found, but there was blood in the colon. ? ?Patient reports she has been given Dilaudid in the past and never had an adverse reaction to it.  She does have reported allergies to other narcotics such as tramadol and Norco. ? ?HPI ? ?  ? ?Home Medications ?Prior to Admission medications   ?Medication Sig Start Date End Date Taking? Authorizing Provider  ?albuterol (PROVENTIL) (2.5 MG/3ML) 0.083% nebulizer solution Take 3 mLs (2.5 mg total) by nebulization every 6 (six) hours as needed for wheezing or shortness of breath. 12/16/20   [name], MD  ?albuterol (VENTOLIN HFA) 108 (90 Base) MCG/ACT inhaler Inhale 2 puffs into the lungs every 6 (six) hours as needed for wheezing or shortness of breath. 02/09/21   [name], MD  ?amLODipine (NORVASC) 5 MG tablet Take 5 mg by mouth daily. 04/12/21    [provider]  ?Asenapine Maleate 10 MG SUBL Place 1 tablet (10 mg total) under the tongue 2 (two) times daily. 04/06/21   Shelly Coss, MD  ?atorvastatin (LIPITOR) 20 MG tablet Take 1 tablet (20 mg total) by mouth at bedtime. 01/14/21   Mercy Riding, MD  ?carvedilol (COREG) 6.25 MG tablet Take 1 tablet (6.25 mg total) by mouth 2 (two) times daily with a meal. 04/19/21   [name], MD  ?colchicine 0.6 MG tablet Take 2 tablets by mouth at the onset of gout flare, may repeat 1 tablet in 1 hour if symptoms persist ?Patient taking differently: Take 0.6 mg by mouth every morning. 01/14/21   Mercy Riding, MD  ?cyclobenzaprine (FLEXERIL) 10 MG tablet Take 10 mg by mouth 2 (two) times daily as needed for muscle pain. 04/12/21   [provider]  ?diazepam (VALIUM) 5 MG tablet Take 1 tablet (5 mg total) by mouth daily as needed for anxiety. 04/06/21   Shelly Coss, MD  ?diclofenac (VOLTAREN) 75 MG EC tablet Take 75 mg by mouth 2 (two) times daily as needed for pain. 04/12/21   [provider]  ?diclofenac Sodium (VOLTAREN) 1 % GEL Apply 4 g topically 4 (four) times daily as needed (pain). 04/19/21   [name], MD  ?dicyclomine (BENTYL) 10 MG capsule Take 1 capsule (10 mg total) by mouth 3 (three) times  daily before meals. 12/24/20   [name], MD  ?DULoxetine (CYMBALTA) 30 MG capsule Take 30 mg by mouth daily. 03/22/21   [provider]  ?escitalopram (LEXAPRO) 20 MG tablet Take 20 mg by mouth every morning.    [provider]  ?Fluticasone-Umeclidin-Vilant (TRELEGY ELLIPTA) 100-62.5-25 MCG/ACT AEPB Inhale 1 puff into the lungs daily. 01/14/21   Mercy Riding, MD  ?gabapentin (NEURONTIN) 600 MG tablet TAKE 1 TABLET(600 MG) BY MOUTH TWICE DAILY ?Patient taking differently: Take 600 mg by mouth 2 (two) times daily. 04/19/21   [name], MD  ?gabapentin (NEURONTIN) 800 MG tablet Take 800 mg by mouth daily. 04/18/21   [provider]  ?hydrOXYzine (ATARAX)  25 MG tablet Take 1 tablet (25 mg total) by mouth every 8 (eight) hours as needed. For anxiety 04/06/21   Shelly Coss, MD  ?Iron, Ferrous Sulfate, 325 (65 Fe) MG TABS Take 325 mg by mouth daily. 01/14/21   Mercy Riding, MD  ?ketoconazole (NIZORAL) 2 % cream Apply 1 application. topically daily. Apply to finger nails 09/27/20   [provider]  ?lisdexamfetamine (VYVANSE) 10 MG capsule Take 1 capsule (10 mg total) by mouth daily at 12 noon. 04/06/21   Shelly Coss, MD  ?methocarbamol (ROBAXIN) 500 MG tablet Take 1 tablet (500 mg total) by mouth every 6 (six) hours as needed for muscle spasms. 04/19/21   [name], MD  ?montelukast (SINGULAIR) 10 MG tablet Take 1 tablet (10 mg total) by mouth at bedtime. 01/14/21   Mercy Riding, MD  ?Multiple Vitamin (MULTIVITAMIN WITH MINERALS) TABS tablet Take 1 tablet by mouth daily. 01/14/21   Mercy Riding, MD  ?omeprazole (PRILOSEC) 40 MG capsule TAKE 1 CAPSULE(40 MG) BY MOUTH DAILY ?Patient taking differently: Take 40 mg by mouth every morning. 10/23/20   [name], MD  ?oxybutynin (DITROPAN) 5 MG tablet TAKE 1 TABLET(5 MG) BY MOUTH TWICE DAILY ?Patient taking differently: Take 5 mg by mouth 2 (two) times daily. 10/23/20   [name], MD  ?potassium chloride SA (KLOR-CON M) 20 MEQ tablet Take 1 tablet (20 mEq total) by mouth daily. ?Patient taking differently: Take 40 mEq by mouth 2 (two) times daily. 01/17/21 03/30/21  Mercy Riding, MD  ?prazosin (MINIPRESS) 2 MG capsule Take 1 capsule (2 mg total) by mouth at bedtime. 02/14/21   [name], MD  ?sodium chloride (OCEAN) 0.65 % SOLN nasal spray Place 2 sprays into both nostrils daily.    [provider]  ?terbinafine (LAMISIL AT) 1 % cream Apply 1 application topically 2 (two) times daily. ?Patient taking differently: Apply 1 application. topically 3 (three) times daily. Apply to feet 09/07/20   [name], MD  ?thiamine 100 MG tablet Take 1 tablet (100 mg total) by mouth  daily. ?Patient not taking: Reported on 03/30/2021 02/27/20   Modena Jansky, MD  ?torsemide (DEMADEX) 20 MG tablet Take 1 tablet (20 mg total) by mouth daily. 04/19/21   [name], MD  ?   ? ?Allergies    ?Hydrocodone, Latuda [lurasidone hcl], Magnesium-containing compounds, Prednisone, Tramadol, Codeine, Trazodone, Topamax [topiramate], Sulfa antibiotics, and Tape   ? ?Review of Systems   ?Review of Systems ? ?Physical Exam ?Updated Vital Signs ?BP (!) 160/92   Pulse 77   Temp 99.6 ?F (37.6 ?C) (Oral)   Resp 19   SpO2 95%  ?Physical Exam ?Constitutional:   ?   General: [name] not in acute distress. ?HENT:  ?   Head: Normocephalic  and atraumatic.  ?Eyes:  ?   Conjunctiva/sclera: Conjunctivae normal.  ?   Pupils: Pupils are equal, round, and reactive to light.  ?Cardiovascular:  ?   Rate and Rhythm: Normal rate and regular rhythm.  ?Pulmonary:  ?   Effort: Pulmonary effort is normal. No respiratory distress.  ?Abdominal:  ?   General: There is no distension.  ?   Tenderness: There is no abdominal tenderness.  ?Musculoskeletal:  ?   Right lower leg: Edema present.  ?   Left lower leg: Edema present.  ?Skin: ?   General: Skin is warm and dry.  ?Neurological:  ?   General: No focal deficit present.  ?   Mental Status: [name] alert. Mental status is at baseline.  ?Psychiatric:     ?   Mood and Affect: Mood normal.     ?   Behavior: Behavior normal.  ? ? ?ED Results / Procedures / Treatments   ?Labs ?(all labs ordered are listed, but only abnormal results are displayed) ?Labs Reviewed  ?BASIC METABOLIC PANEL - Abnormal; Notable for the following components:  ?    Result Value  ? Potassium 3.4 (*)   ? Glucose, Bld 104 (*)   ? Creatinine, Ser 1.58 (*)   ? GFR, Estimated 37 (*)   ? All other components within normal limits  ?CBC - Abnormal; Notable for the following components:  ? RBC 3.64 (*)   ? Hemoglobin 10.6 (*)   ? HCT 33.9 (*)   ? Platelets 403 (*)   ? All other components within normal limits   ? ? ?EKG ?None ? ?Radiology ?No results found. ? ?Procedures ?Procedures  ? ? ?Medications Ordered in ED ?Medications  ?carvedilol (COREG) tablet 6.25 mg (6.25 mg Oral Given 04/20/21 0841)  ?gabapentin (NEURONTIN) capsule 600 mg (6

## 2021-04-20 NOTE — Progress Notes (Signed)
Transition of Care [facility]) - Emergency Department Mini Assessment ? ? ?Patient Details  ?Name: Tiffany [name] ?MRN: [id number] ?Date of Birth: 09-26-1960 ? ?Transition of Care (TOC) CM/SW Contact:    ?Fuller Mandril, RN ?Phone Number: ?04/20/2021, 11:28 AM ? ? ?Clinical Narrative: ?RNCM spoke with pt at bedside regarding home health services, [facility]) through her Medicaid and adult day programs. ? ?RNCM explained the limitations of home health referrals are due to her coverage.  RNCM referred her to several home health agencies and pt has been denied by all. ? ?RNCM educated on engaging with her PCP to help her complete paperwork for [facility] services. ? ?RNCM placed information for adult day programs on After Visit Summary (AVS). ? ? ? ?ED Mini Assessment: ?What brought you to the Emergency Department? : leg pain ? ?Barriers to Discharge: Barriers Resolved ? ?Barrier interventions: referral to home health ? ?Means of departure: Hospital Transport ? ?Interventions which prevented an admission or readmission: Home Health Consult or Services ? ? ? ?Patient Contact and Communications ?  ?  ?  ? ,     ?  ?  ? ?Patient states their goals for this hospitalization and ongoing recovery are:: get something for this pain ?  ?  ? ?Admission diagnosis:  Abd Pain; Leg Pain ?Patient Active Problem List  ? Diagnosis Date Noted  ? Hematochezia   ? Hyperlipidemia 03/31/2021  ? GI bleed 03/30/2021  ? Acute GI bleeding 03/29/2021  ? Atypical chest pain 01/13/2021  ? GERD (gastroesophageal reflux disease)   ? Neuropathy 11/18/2020  ? Pain in both feet 11/18/2020  ? Localized edema 11/18/2020  ? Encounter for medication management 11/17/2020  ? Protein-calorie malnutrition, severe 05/15/2020  ? Malnutrition of moderate degree 05/13/2020  ? Periumbilical abdominal pain   ? Dysphagia   ? Nausea vomiting and diarrhea   ? Generalized abdominal pain   ? E. coli UTI 02/20/2020  ? Acute metabolic encephalopathy 73/41/9379  ?  Psychosis ([employer]) 02/20/2020  ? Schizophrenia, schizo-affective ([place]) 02/20/2020  ? Metabolic acidosis, normal anion gap (NAG) 02/20/2020  ? Altered mental status   ? Hypocalcemia   ? AKI (acute kidney injury) ([place]) 02/18/2020  ? Pelvic mass in female 01/02/2020  ? Genital herpes 11/25/2019  ? Stage 2 skin ulcer of sacral region ([place]) 10/28/2019  ? ARF (acute renal failure) ([place]) 10/27/2019  ? Family discord 02/04/2019  ? Aggressive behavior   ? PTSD (post-traumatic stress disorder) 05/27/2018  ? Schizoaffective disorder, bipolar type ([place]) 04/05/2018  ? Closed displaced fracture of fifth metacarpal bone 03/21/2018  ? Difficulty with speech 01/24/2018  ? Encephalopathy 11/21/2017  ? Drug abuse ([place]) 11/21/2017  ? Frequent falls 10/11/2017  ? Binge eating disorder   ? Dependence on continuous supplemental oxygen 05/14/2017  ? Gout 04/11/2017  ? Hypomagnesemia   ? Stage 3b chronic kidney disease (CKD) ([place]) 12/15/2016  ? Carotid artery stenosis   ? Hypokalemia   ? Osteoarthritis 10/26/2016  ? Anoxic brain injury ([place]) 09/08/2016  ? Overactive bladder 06/07/2016  ? QT prolongation   ? OSA and COPD overlap syndrome ([place])   ? Arthritis   ? Essential hypertension 03/22/2016  ? Cocaine use disorder, severe, in sustained remission ([place]) 12/17/2015  ? History of drug abuse in remission ([place]) 11/28/2015  ? Hyponatremia 11/25/2015  ? Chronic diastolic congestive heart failure ([place])   ? Chronic respiratory failure with hypoxia ([place]) 06/22/2015  ? Tobacco use disorder 07/22/2014  ? COPD (chronic obstructive pulmonary  disease) ([place]) 07/08/2014  ? Seizure ([place]) 01/04/2013  ? Chronic pain syndrome 06/18/2012  ? Dyslipidemia 04/24/2011  ? Anemia 04/24/2011  ? Diabetic neuropathy ([place]) 04/24/2011  ? Morbid obesity ([place]) 10/18/2010  ? Type 2 diabetes mellitus ([place]) 10/18/2010  ? ?PCP:  Lin Landsman, MD ?Pharmacy:   ?[facility] [place] ?[place] [address] ?Phone: [phone] Fax: [phone] ? ?[place] [employer] ?[address] ?[place] [address] ?Phone: [phone] Fax: [phone] ? ? ?

## 2021-04-20 NOTE — ED Notes (Signed)
Diet called for ?

## 2021-04-20 NOTE — Telephone Encounter (Signed)
Attempted to cal, the pt but no answer... will try again at a later time.  ?

## 2021-04-21 DIAGNOSIS — R6889 Other general symptoms and signs: Secondary | ICD-10-CM | POA: Diagnosis not present

## 2021-04-21 DIAGNOSIS — Z743 Need for continuous supervision: Secondary | ICD-10-CM | POA: Diagnosis not present

## 2021-04-21 DIAGNOSIS — M79661 Pain in right lower leg: Secondary | ICD-10-CM | POA: Diagnosis not present

## 2021-04-21 DIAGNOSIS — Z7401 Bed confinement status: Secondary | ICD-10-CM | POA: Diagnosis not present

## 2021-04-21 MED ORDER — KETOROLAC TROMETHAMINE 15 MG/ML IJ SOLN
7.5000 mg | Freq: Once | INTRAMUSCULAR | Status: AC
  Administered 2021-04-21: 7.5 mg via INTRAVENOUS
  Filled 2021-04-21: qty 1

## 2021-04-21 MED ORDER — ACETAMINOPHEN 500 MG PO TABS
1000.0000 mg | ORAL_TABLET | Freq: Once | ORAL | Status: AC
  Administered 2021-04-21: 1000 mg via ORAL
  Filled 2021-04-21: qty 2

## 2021-04-21 NOTE — ED Notes (Signed)
Patient is in no acute distress breathing spontaneously to room air . Patient medicated earlier for pain in her legs. Patient is now resting comfortably pending admission to rehab facility.  ?

## 2021-04-21 NOTE — ED Notes (Signed)
CN & SW at [facility] speaking with pt ?

## 2021-04-21 NOTE — ED Notes (Signed)
Pt alert, NAD, calm, interactive, resps e/u, speaking clearly. Morning meds being given. [facility] arriving for transport. Pt delaying transport with new requests/ needs. VSS/ BP high. CN, SW and EDP notified.  ?

## 2021-04-24 ENCOUNTER — Inpatient Hospital Stay (HOSPITAL_COMMUNITY)
Admission: EM | Admit: 2021-04-24 | Discharge: 2021-05-02 | DRG: 074 | Disposition: A | Discharge status: Skilled Nursing Facility | Payer: Medicare Other | Attending: Internal Medicine | Admitting: Internal Medicine

## 2021-04-24 ENCOUNTER — Emergency Department (HOSPITAL_COMMUNITY): Payer: Medicare Other

## 2021-04-24 ENCOUNTER — Encounter (HOSPITAL_COMMUNITY): Payer: Self-pay

## 2021-04-24 DIAGNOSIS — N179 Acute kidney failure, unspecified: Secondary | ICD-10-CM | POA: Diagnosis not present

## 2021-04-24 DIAGNOSIS — R569 Unspecified convulsions: Secondary | ICD-10-CM

## 2021-04-24 DIAGNOSIS — E119 Type 2 diabetes mellitus without complications: Secondary | ICD-10-CM

## 2021-04-24 DIAGNOSIS — Z885 Allergy status to narcotic agent status: Secondary | ICD-10-CM

## 2021-04-24 DIAGNOSIS — Z515 Encounter for palliative care: Secondary | ICD-10-CM | POA: Diagnosis not present

## 2021-04-24 DIAGNOSIS — Z7951 Long term (current) use of inhaled steroids: Secondary | ICD-10-CM

## 2021-04-24 DIAGNOSIS — Z7189 Other specified counseling: Secondary | ICD-10-CM | POA: Diagnosis not present

## 2021-04-24 DIAGNOSIS — J45909 Unspecified asthma, uncomplicated: Secondary | ICD-10-CM | POA: Diagnosis not present

## 2021-04-24 DIAGNOSIS — M159 Polyosteoarthritis, unspecified: Secondary | ICD-10-CM | POA: Diagnosis present

## 2021-04-24 DIAGNOSIS — N184 Chronic kidney disease, stage 4 (severe): Secondary | ICD-10-CM | POA: Diagnosis present

## 2021-04-24 DIAGNOSIS — K219 Gastro-esophageal reflux disease without esophagitis: Secondary | ICD-10-CM | POA: Diagnosis present

## 2021-04-24 DIAGNOSIS — F250 Schizoaffective disorder, bipolar type: Secondary | ICD-10-CM | POA: Diagnosis not present

## 2021-04-24 DIAGNOSIS — Z90710 Acquired absence of both cervix and uterus: Secondary | ICD-10-CM

## 2021-04-24 DIAGNOSIS — R062 Wheezing: Secondary | ICD-10-CM | POA: Diagnosis not present

## 2021-04-24 DIAGNOSIS — Z91199 Patient's noncompliance with other medical treatment and regimen due to unspecified reason: Secondary | ICD-10-CM

## 2021-04-24 DIAGNOSIS — D649 Anemia, unspecified: Secondary | ICD-10-CM | POA: Diagnosis present

## 2021-04-24 DIAGNOSIS — G4733 Obstructive sleep apnea (adult) (pediatric): Secondary | ICD-10-CM | POA: Diagnosis present

## 2021-04-24 DIAGNOSIS — F4310 Post-traumatic stress disorder, unspecified: Secondary | ICD-10-CM | POA: Diagnosis present

## 2021-04-24 DIAGNOSIS — G8929 Other chronic pain: Secondary | ICD-10-CM | POA: Diagnosis present

## 2021-04-24 DIAGNOSIS — Z9181 History of falling: Secondary | ICD-10-CM

## 2021-04-24 DIAGNOSIS — J439 Emphysema, unspecified: Secondary | ICD-10-CM | POA: Diagnosis not present

## 2021-04-24 DIAGNOSIS — I6529 Occlusion and stenosis of unspecified carotid artery: Secondary | ICD-10-CM | POA: Diagnosis not present

## 2021-04-24 DIAGNOSIS — G931 Anoxic brain damage, not elsewhere classified: Secondary | ICD-10-CM | POA: Diagnosis present

## 2021-04-24 DIAGNOSIS — F1421 Cocaine dependence, in remission: Secondary | ICD-10-CM | POA: Diagnosis present

## 2021-04-24 DIAGNOSIS — Z743 Need for continuous supervision: Secondary | ICD-10-CM | POA: Diagnosis not present

## 2021-04-24 DIAGNOSIS — F259 Schizoaffective disorder, unspecified: Secondary | ICD-10-CM | POA: Diagnosis not present

## 2021-04-24 DIAGNOSIS — J9611 Chronic respiratory failure with hypoxia: Secondary | ICD-10-CM | POA: Diagnosis present

## 2021-04-24 DIAGNOSIS — G834 Cauda equina syndrome: Principal | ICD-10-CM | POA: Diagnosis present

## 2021-04-24 DIAGNOSIS — Z87892 Personal history of anaphylaxis: Secondary | ICD-10-CM

## 2021-04-24 DIAGNOSIS — D500 Iron deficiency anemia secondary to blood loss (chronic): Secondary | ICD-10-CM | POA: Diagnosis present

## 2021-04-24 DIAGNOSIS — E1140 Type 2 diabetes mellitus with diabetic neuropathy, unspecified: Secondary | ICD-10-CM | POA: Diagnosis present

## 2021-04-24 DIAGNOSIS — Z8673 Personal history of transient ischemic attack (TIA), and cerebral infarction without residual deficits: Secondary | ICD-10-CM

## 2021-04-24 DIAGNOSIS — Z20822 Contact with and (suspected) exposure to covid-19: Secondary | ICD-10-CM | POA: Diagnosis not present

## 2021-04-24 DIAGNOSIS — I10 Essential (primary) hypertension: Secondary | ICD-10-CM | POA: Diagnosis not present

## 2021-04-24 DIAGNOSIS — J42 Unspecified chronic bronchitis: Secondary | ICD-10-CM

## 2021-04-24 DIAGNOSIS — Z882 Allergy status to sulfonamides status: Secondary | ICD-10-CM

## 2021-04-24 DIAGNOSIS — I130 Hypertensive heart and chronic kidney disease with heart failure and stage 1 through stage 4 chronic kidney disease, or unspecified chronic kidney disease: Secondary | ICD-10-CM | POA: Diagnosis not present

## 2021-04-24 DIAGNOSIS — E875 Hyperkalemia: Secondary | ICD-10-CM | POA: Diagnosis present

## 2021-04-24 DIAGNOSIS — D631 Anemia in chronic kidney disease: Secondary | ICD-10-CM | POA: Diagnosis not present

## 2021-04-24 DIAGNOSIS — N1832 Chronic kidney disease, stage 3b: Secondary | ICD-10-CM | POA: Diagnosis present

## 2021-04-24 DIAGNOSIS — Z91048 Other nonmedicinal substance allergy status: Secondary | ICD-10-CM

## 2021-04-24 DIAGNOSIS — M47814 Spondylosis without myelopathy or radiculopathy, thoracic region: Secondary | ICD-10-CM | POA: Diagnosis not present

## 2021-04-24 DIAGNOSIS — I252 Old myocardial infarction: Secondary | ICD-10-CM

## 2021-04-24 DIAGNOSIS — Z8674 Personal history of sudden cardiac arrest: Secondary | ICD-10-CM

## 2021-04-24 DIAGNOSIS — I2510 Atherosclerotic heart disease of native coronary artery without angina pectoris: Secondary | ICD-10-CM | POA: Diagnosis present

## 2021-04-24 DIAGNOSIS — I5032 Chronic diastolic (congestive) heart failure: Secondary | ICD-10-CM | POA: Diagnosis present

## 2021-04-24 DIAGNOSIS — M4316 Spondylolisthesis, lumbar region: Secondary | ICD-10-CM | POA: Diagnosis not present

## 2021-04-24 DIAGNOSIS — E785 Hyperlipidemia, unspecified: Secondary | ICD-10-CM | POA: Diagnosis present

## 2021-04-24 DIAGNOSIS — Z888 Allergy status to other drugs, medicaments and biological substances status: Secondary | ICD-10-CM

## 2021-04-24 DIAGNOSIS — J9612 Chronic respiratory failure with hypercapnia: Secondary | ICD-10-CM | POA: Diagnosis present

## 2021-04-24 DIAGNOSIS — Z79899 Other long term (current) drug therapy: Secondary | ICD-10-CM

## 2021-04-24 DIAGNOSIS — M5124 Other intervertebral disc displacement, thoracic region: Secondary | ICD-10-CM | POA: Diagnosis not present

## 2021-04-24 DIAGNOSIS — R32 Unspecified urinary incontinence: Secondary | ICD-10-CM | POA: Diagnosis present

## 2021-04-24 DIAGNOSIS — Z818 Family history of other mental and behavioral disorders: Secondary | ICD-10-CM

## 2021-04-24 DIAGNOSIS — Z87891 Personal history of nicotine dependence: Secondary | ICD-10-CM

## 2021-04-24 DIAGNOSIS — R531 Weakness: Secondary | ICD-10-CM | POA: Diagnosis not present

## 2021-04-24 DIAGNOSIS — F1911 Other psychoactive substance abuse, in remission: Secondary | ICD-10-CM | POA: Diagnosis present

## 2021-04-24 DIAGNOSIS — M48061 Spinal stenosis, lumbar region without neurogenic claudication: Secondary | ICD-10-CM | POA: Diagnosis present

## 2021-04-24 DIAGNOSIS — J449 Chronic obstructive pulmonary disease, unspecified: Secondary | ICD-10-CM | POA: Diagnosis present

## 2021-04-24 DIAGNOSIS — E1122 Type 2 diabetes mellitus with diabetic chronic kidney disease: Secondary | ICD-10-CM | POA: Diagnosis not present

## 2021-04-24 DIAGNOSIS — Z8249 Family history of ischemic heart disease and other diseases of the circulatory system: Secondary | ICD-10-CM

## 2021-04-24 DIAGNOSIS — R109 Unspecified abdominal pain: Secondary | ICD-10-CM | POA: Diagnosis not present

## 2021-04-24 DIAGNOSIS — G894 Chronic pain syndrome: Secondary | ICD-10-CM | POA: Diagnosis not present

## 2021-04-24 DIAGNOSIS — M109 Gout, unspecified: Secondary | ICD-10-CM | POA: Diagnosis not present

## 2021-04-24 DIAGNOSIS — F4312 Post-traumatic stress disorder, chronic: Secondary | ICD-10-CM | POA: Diagnosis present

## 2021-04-24 LAB — COMPREHENSIVE METABOLIC PANEL
ALT: 27 U/L (ref 0–44)
AST: 34 U/L (ref 15–41)
Albumin: 4 g/dL (ref 3.5–5.0)
Alkaline Phosphatase: 146 U/L — ABNORMAL HIGH (ref 38–126)
Anion gap: 10 (ref 5–15)
BUN: 31 mg/dL — ABNORMAL HIGH (ref 8–23)
CO2: 21 mmol/L — ABNORMAL LOW (ref 22–32)
Calcium: 9.7 mg/dL (ref 8.9–10.3)
Chloride: 106 mmol/L (ref 98–111)
Creatinine, Ser: 1.89 mg/dL — ABNORMAL HIGH (ref 0.44–1.00)
GFR, Estimated: 30 mL/min — ABNORMAL LOW (ref >60)
Glucose, Bld: 82 mg/dL (ref 70–99)
Potassium: 5.6 mmol/L — ABNORMAL HIGH (ref 3.5–5.1)
Sodium: 137 mmol/L (ref 135–145)
Total Bilirubin: 0.3 mg/dL (ref 0.3–1.2)
Total Protein: 7.1 g/dL (ref 6.5–8.1)

## 2021-04-24 LAB — CBG MONITORING, ED: Glucose-Capillary: 76 mg/dL (ref 70–99)

## 2021-04-24 LAB — CBC
HCT: 34.1 % — ABNORMAL LOW (ref 36.0–46.0)
Hemoglobin: 10.3 g/dL — ABNORMAL LOW (ref 12.0–15.0)
MCH: 28.5 pg (ref 26.0–34.0)
MCHC: 30.2 g/dL (ref 30.0–36.0)
MCV: 94.5 fL (ref 80.0–100.0)
Platelets: 320 10*3/uL (ref 150–400)
RBC: 3.61 MIL/uL — ABNORMAL LOW (ref 3.87–5.11)
RDW: 15.2 % (ref 11.5–15.5)
WBC: 6 10*3/uL (ref 4.0–10.5)
nRBC: 0 % (ref 0.0–0.2)

## 2021-04-24 LAB — RESP PANEL BY RT-PCR (FLU A&B, COVID) ARPGX2
Influenza A by PCR: NEGATIVE
Influenza B by PCR: NEGATIVE
SARS Coronavirus 2 by RT PCR: NEGATIVE

## 2021-04-24 LAB — LIPASE, BLOOD: Lipase: 31 U/L (ref 11–51)

## 2021-04-24 MED ORDER — LORAZEPAM 2 MG/ML IJ SOLN
0.5000 mg | Freq: Once | INTRAMUSCULAR | Status: AC
  Administered 2021-04-24: 0.5 mg via INTRAVENOUS
  Filled 2021-04-24: qty 1

## 2021-04-24 MED ORDER — GADOBUTROL 1 MMOL/ML IV SOLN
7.0000 mL | Freq: Once | INTRAVENOUS | Status: AC | PRN
  Administered 2021-04-24: 7 mL via INTRAVENOUS

## 2021-04-24 MED ORDER — SODIUM CHLORIDE 0.9% FLUSH
3.0000 mL | Freq: Two times a day (BID) | INTRAVENOUS | Status: DC
  Administered 2021-04-24 – 2021-05-02 (×11): 3 mL via INTRAVENOUS

## 2021-04-24 MED ORDER — AMLODIPINE BESYLATE 5 MG PO TABS
5.0000 mg | ORAL_TABLET | Freq: Every day | ORAL | Status: DC
  Administered 2021-04-25: 5 mg via ORAL
  Filled 2021-04-24: qty 1

## 2021-04-24 MED ORDER — LORAZEPAM 2 MG/ML IJ SOLN
1.0000 mg | Freq: Once | INTRAMUSCULAR | Status: AC
Start: 2021-04-24 — End: 2021-04-24
  Administered 2021-04-24: 1 mg via INTRAVENOUS
  Filled 2021-04-24: qty 1

## 2021-04-24 MED ORDER — HYDROMORPHONE HCL 1 MG/ML IJ SOLN
1.0000 mg | Freq: Once | INTRAMUSCULAR | Status: AC
  Administered 2021-04-24: 1 mg via INTRAVENOUS
  Filled 2021-04-24: qty 1

## 2021-04-24 MED ORDER — ONDANSETRON HCL 4 MG/2ML IJ SOLN
4.0000 mg | Freq: Once | INTRAMUSCULAR | Status: AC
  Administered 2021-04-24: 4 mg via INTRAVENOUS
  Filled 2021-04-24: qty 2

## 2021-04-24 MED ORDER — MONTELUKAST SODIUM 10 MG PO TABS
10.0000 mg | ORAL_TABLET | Freq: Every day | ORAL | Status: DC
  Administered 2021-04-24 – 2021-05-01 (×8): 10 mg via ORAL
  Filled 2021-04-24 (×9): qty 1

## 2021-04-24 MED ORDER — ASENAPINE MALEATE 5 MG SL SUBL
10.0000 mg | SUBLINGUAL_TABLET | Freq: Two times a day (BID) | SUBLINGUAL | Status: DC
  Administered 2021-04-25 – 2021-05-02 (×13): 10 mg via SUBLINGUAL
  Filled 2021-04-24 (×14): qty 2

## 2021-04-24 MED ORDER — GABAPENTIN 400 MG PO CAPS
800.0000 mg | ORAL_CAPSULE | Freq: Every day | ORAL | Status: DC
  Administered 2021-04-24 – 2021-04-25 (×2): 800 mg via ORAL
  Filled 2021-04-24 (×2): qty 2

## 2021-04-24 MED ORDER — SODIUM CHLORIDE 0.9 % IV BOLUS
500.0000 mL | Freq: Once | INTRAVENOUS | Status: AC
  Administered 2021-04-24: 500 mL via INTRAVENOUS

## 2021-04-24 MED ORDER — CARVEDILOL 6.25 MG PO TABS
6.2500 mg | ORAL_TABLET | Freq: Two times a day (BID) | ORAL | Status: DC
  Administered 2021-04-25 – 2021-05-02 (×15): 6.25 mg via ORAL
  Filled 2021-04-24 (×2): qty 1
  Filled 2021-04-24: qty 2
  Filled 2021-04-24 (×12): qty 1

## 2021-04-24 MED ORDER — AMLODIPINE BESYLATE 5 MG PO TABS
5.0000 mg | ORAL_TABLET | Freq: Once | ORAL | Status: AC
  Administered 2021-04-24: 5 mg via ORAL
  Filled 2021-04-24: qty 1

## 2021-04-24 MED ORDER — ATORVASTATIN CALCIUM 10 MG PO TABS
20.0000 mg | ORAL_TABLET | Freq: Every day | ORAL | Status: DC
  Administered 2021-04-25 – 2021-05-02 (×8): 20 mg via ORAL
  Filled 2021-04-24 (×8): qty 2

## 2021-04-24 MED ORDER — INSULIN ASPART 100 UNIT/ML IJ SOLN
0.0000 [IU] | Freq: Three times a day (TID) | INTRAMUSCULAR | Status: DC
  Administered 2021-04-29 – 2021-05-02 (×3): 1 [IU] via SUBCUTANEOUS
  Filled 2021-04-24: qty 0.09

## 2021-04-24 MED ORDER — ACETAMINOPHEN 650 MG RE SUPP: 650.0000 mg | Freq: Four times a day (QID) | RECTAL | Status: DC | PRN

## 2021-04-24 MED ORDER — OXYCODONE-ACETAMINOPHEN 5-325 MG PO TABS
1.0000 | ORAL_TABLET | Freq: Once | ORAL | Status: AC
  Administered 2021-04-24: 1 via ORAL
  Filled 2021-04-24: qty 1

## 2021-04-24 MED ORDER — PRAZOSIN HCL 2 MG PO CAPS
2.0000 mg | ORAL_CAPSULE | Freq: Every day | ORAL | Status: DC
  Administered 2021-04-24 – 2021-05-01 (×8): 2 mg via ORAL
  Filled 2021-04-24 (×2): qty 1
  Filled 2021-04-24: qty 2
  Filled 2021-04-24 (×5): qty 1

## 2021-04-24 MED ORDER — ACETAMINOPHEN 325 MG PO TABS
650.0000 mg | ORAL_TABLET | Freq: Four times a day (QID) | ORAL | Status: DC | PRN
Start: 2021-04-24 — End: 2021-05-02
  Filled 2021-04-24: qty 2

## 2021-04-24 MED ORDER — DIAZEPAM 5 MG PO TABS
5.0000 mg | ORAL_TABLET | Freq: Every day | ORAL | Status: DC | PRN
  Administered 2021-04-24: 5 mg via ORAL
  Filled 2021-04-24: qty 1

## 2021-04-24 MED ORDER — GABAPENTIN 600 MG PO TABS
600.0000 mg | ORAL_TABLET | Freq: Two times a day (BID) | ORAL | Status: DC
Start: 2021-04-24 — End: 2021-04-24

## 2021-04-24 MED ORDER — PANTOPRAZOLE SODIUM 40 MG PO TBEC
40.0000 mg | DELAYED_RELEASE_TABLET | Freq: Every day | ORAL | Status: DC
  Administered 2021-04-25 – 2021-05-02 (×8): 40 mg via ORAL
  Filled 2021-04-24 (×8): qty 1

## 2021-04-24 MED ORDER — HYDROMORPHONE HCL 1 MG/ML IJ SOLN
0.5000 mg | INTRAMUSCULAR | Status: DC | PRN
  Administered 2021-04-25 – 2021-04-26 (×2): 0.5 mg via INTRAVENOUS
  Filled 2021-04-24: qty 1
  Filled 2021-04-24: qty 0.5

## 2021-04-24 MED ORDER — ALBUTEROL SULFATE HFA 108 (90 BASE) MCG/ACT IN AERS: 2.0000 | INHALATION_SPRAY | Freq: Four times a day (QID) | RESPIRATORY_TRACT | Status: DC | PRN

## 2021-04-24 MED ORDER — UMECLIDINIUM BROMIDE 62.5 MCG/ACT IN AEPB
1.0000 | INHALATION_SPRAY | Freq: Every day | RESPIRATORY_TRACT | Status: DC
  Administered 2021-04-25 – 2021-05-02 (×8): 1 via RESPIRATORY_TRACT
  Filled 2021-04-24 (×3): qty 7

## 2021-04-24 MED ORDER — ALBUTEROL SULFATE (2.5 MG/3ML) 0.083% IN NEBU: 2.5000 mg | INHALATION_SOLUTION | Freq: Four times a day (QID) | RESPIRATORY_TRACT | Status: DC | PRN

## 2021-04-24 MED ORDER — CARVEDILOL 3.125 MG PO TABS
6.2500 mg | ORAL_TABLET | Freq: Two times a day (BID) | ORAL | Status: DC
  Administered 2021-04-24: 6.25 mg via ORAL
  Filled 2021-04-24: qty 2

## 2021-04-24 MED ORDER — FLUTICASONE FUROATE-VILANTEROL 100-25 MCG/ACT IN AEPB
1.0000 | INHALATION_SPRAY | Freq: Every day | RESPIRATORY_TRACT | Status: DC
  Administered 2021-04-25 – 2021-05-02 (×8): 1 via RESPIRATORY_TRACT
  Filled 2021-04-24 (×2): qty 28

## 2021-04-24 MED ORDER — POLYETHYLENE GLYCOL 3350 17 G PO PACK
17.0000 g | PACK | Freq: Every day | ORAL | Status: DC | PRN
  Administered 2021-04-27: 17 g via ORAL
  Filled 2021-04-24: qty 1

## 2021-04-24 NOTE — ED Triage Notes (Signed)
Pt comes from home via [facility] for RLQ abd pain that has been going on for the past 8 hours, denies n/v, diarrhea x 3, pt also reports bilateral leg weakness and says that she can not walk  ?

## 2021-04-24 NOTE — ED Notes (Signed)
Attempted to get admission labs x2. Unsuccessful. Patient said "Tiffany [name] are not going to keep poking me." ?

## 2021-04-24 NOTE — ED Notes (Signed)
Second light green tube drawn and sent to lab ? ?

## 2021-04-24 NOTE — ED Provider Notes (Signed)
Patient received as signout from Dr Maryan Rued.  Briefly this 61 year old female who has chronic lumbar and lower back pain and complains of worsening pain and difficulty and weakness with her legs, pending MRI scan.  An attempt was made to obtain MRI earlier but the patient cannot tolerate lying flat or lying still in the scanner.  She subsequently was given pain medications, appeared to be sedated, we will make a second attempt at MRI.  There was concern for possible discitis or spinal canal narrowing noted on the CT scan of the spine. ? ?Update - in discussion with Neurosurgeon Dr Christella Noa, we will admit the patient to the medical service for pain control, consideration of psychiatric evaluation to determine competency for consent.  Patient may be a candidate for neuro-surgical intervention in the future, but her capacity for consent is not entirely clear at this moment.  Unfortunately she has received enough sedation from earlier providers for MRI that I am not able to hold a meaningful conversation with her at this time. ?  ?Wyvonnia Dusky, MD ?04/24/21 2113 ? ?

## 2021-04-24 NOTE — ED Provider Notes (Signed)
?[place] DEPT ?[facility] Emergency Department ?Provider Note ?MRN:  [id number]  ?Arrival date & time: 04/24/21    ? ?Chief Complaint   ?Abdominal Pain ?  ?History of Present Illness   ?Tiffany [name] is a 61 y.o. year-old female with a history of CHF, COPD, chronic pain presenting to the ED with chief complaint of abdominal pain. ? ?Abdominal pain and lower extremity pain and swelling, largely unchanged but not getting better.  May be a bit worse this evening. ? ?Review of Systems  ?A thorough review of systems was obtained and all systems are negative except as noted in the HPI and PMH.  ? ?Patient's Health History   ? ?Past Medical History:  ?Diagnosis Date  ? Agitation 11/22/2017  ? Anoxic brain injury ([place]) 09/08/2016  ? C. Arrest due to respiratory failure and COPD exacerbation  ? Anxiety   ? Arthritis   ? "all over" (04/10/2016)  ? Asthma 10/18/2010  ? Binge eating disorder   ? Cardiac arrest ([place]) 09/08/2016  ? PEA  ? Carotid artery stenosis   ? 1-39% bilateral by dopplers 11/2016  ? Chronic diastolic (congestive) heart failure (HCC)   ? Chronic kidney disease, stage 3 (HCC)   ? Chronic pain syndrome 06/18/2012  ? Chronic post-traumatic stress disorder (PTSD) 05/27/2018  ? Chronic respiratory failure with hypoxia and hypercapnia ([place]) 06/22/2015  ? TRILOGY Vent >AVAPA-ES., Vt target 200-400, Max P 30 , PS max 20 , PS min 6-10 , E Max 6, E Min 4, Rate Auto AVAPS Rate 2 (titrate for pt comfort) , bleed O2 at 5l/m continuous flow .   ? Closed displaced fracture of fifth metacarpal bone 03/21/2018  ? Cocaine use disorder, severe, in sustained remission ([place]) 12/17/2015  ? Complication of anesthesia   ? decreased bp, decreased heart rate  ? COPD (chronic obstructive pulmonary disease) ([place]) 07/08/2014  ? Depression   ? Diabetic neuropathy ([place]) 04/24/2011  ? Difficulty with speech 01/24/2018  ? Disorder of nervous system   ? Drug abuse ([place]) 11/21/2017  ? Dyslipidemia 04/24/2011  ? Elevated troponin  04/28/2012  ? Emphysema   ? Encephalopathy 11/21/2017  ? Essential hypertension 03/22/2016  ? Fibula fracture 07/10/2016  ? Frequent falls 10/11/2017  ? GERD (gastroesophageal reflux disease)   ? Gout 04/11/2017  ? Heart attack ([place]) 1980s  ? History of blood transfusion 1994  ? "couldn't stop bleeding from my period"  ? History of drug abuse in remission ([place]) 11/28/2015  ? Quit in 2017  ? Hyperlipidemia LDL goal <70   ? Incontinence   ? Manic depression ([place])   ? Morbid obesity ([place]) 10/18/2010  ? Obstructive sleep apnea 10/18/2010  ? On home oxygen therapy   ? "6L; 24/7" (04/10/2016)  ? OSA on CPAP   ? "wear mask sometimes" (04/10/2016)  ? Paranoid ([place])   ? "sometimes; I'm on RX for it" (04/10/2016)  ? Prolonged Q-T interval on ECG   ? Rectal bleeding 12/31/2015  ? Schizoaffective disorder, bipolar type ([place]) 04/05/2018  ? Seasonal allergies   ? Seborrheic keratoses 12/31/2013  ? Seizures ([place])   ? "don't know what kind; last one was ~ 1 yr ago" (04/10/2016)  ? Sinus bradycardia   ? Stroke [facility]) 1980s  ? denies residual on 04/10/2016  ? Thrush 09/19/2013  ? Type 2 diabetes mellitus ([place]) 10/18/2010  ?  ?Past Surgical History:  ?Procedure Laterality Date  ? [place]  ? COLONOSCOPY WITH PROPOFOL N/A 04/01/2021  ?  Procedure: COLONOSCOPY WITH PROPOFOL;  Surgeon: Carol Ada, MD;  Location: [place];  Service: Gastroenterology;  Laterality: N/A;  Rectal bleeding with drop in hemoglobin to 7.2 g/dL  ? HERNIA REPAIR    ? IR CHOLANGIOGRAM EXISTING TUBE  07/20/2016  ? IR PERC CHOLECYSTOSTOMY  05/10/2016  ? IR RADIOLOGIST EVAL & MGMT  06/08/2016  ? IR RADIOLOGIST EVAL & MGMT  06/29/2016  ? IR SINUS/FIST TUBE CHK-NON GI  07/12/2016  ? RIGHT/LEFT HEART CATH AND CORONARY ANGIOGRAPHY N/A 06/19/2017  ? Procedure: RIGHT/LEFT HEART CATH AND CORONARY ANGIOGRAPHY;  Surgeon: Jolaine Artist, MD;  Location: [facility];  Service: Cardiovascular;  Laterality: N/A;  ? TIBIA IM NAIL INSERTION Right 07/12/2016  ?  Procedure: INTRAMEDULLARY (IM) NAIL RIGHT TIBIA;  Surgeon: Leandrew Koyanagi, MD;  Location: [place];  Service: Orthopedics;  Laterality: Right;  ? UMBILICAL HERNIA REPAIR  ~ 1963  ? "that's why I don't have a belly button"  ? VAGINAL HYSTERECTOMY    ?  ?Family History  ?Problem Relation Age of Onset  ? Cancer Mother   ?     lung  ? Depression Mother   ? Cancer Father   ?     prostate  ? Depression Sister   ? Anxiety disorder Sister   ? Schizophrenia Sister   ? Bipolar disorder Sister   ? Depression Sister   ? Depression Brother   ? Heart failure Other   ?     cousin  ?  ?Social History  ? ?Socioeconomic History  ? Marital status: Widowed  ?  Spouse name: Not on file  ? Number of children: 3  ? Years of education: Not on file  ? Highest education level: Not on file  ?Occupational History  ? Occupation: disabled  ?  Comment: factory production  ?Tobacco Use  ? Smoking status: Former  ?  Packs/day: 1.50  ?  Years: 38.00  ?  Pack years: 57.00  ?  Types: Cigarettes  ?  Start date: 03/13/1977  ?  Quit date: 04/10/2016  ?  Years since quitting: 5.0  ? Smokeless tobacco: Never  ?Vaping Use  ? Vaping Use: Never used  ?Substance and Sexual Activity  ? Alcohol use: No  ?  Alcohol/week: 0.0 standard drinks  ? Drug use: Not Currently  ?  Types: Cocaine  ?  Comment: 04/10/2016 "last used cocaine back in November 2017"  ? Sexual activity: Not Currently  ?  Birth control/protection: Surgical  ?Other Topics Concern  ? Not on file  ?Social History Narrative  ? Has 1 son, [name]  ? Lives with son and his boyfriend  ? Her house has ramps and handrails should she ever needs them.   ? Her mother lives down the street from her and is a good support person in addition to her son.  ? She drives herself, has private transportation.   ? Cocaine free since 02/24/16, smoke free since 04/10/16  ? ?Social Determinants of Health  ? ?Financial Resource Strain: Not on file  ?Food Insecurity: Not on file  ?Transportation Needs: Not on file  ?Physical Activity:  Not on file  ?Stress: Not on file  ?Social Connections: Not on file  ?Intimate Partner Violence: Not on file  ?  ? ?Physical Exam  ? ?Vitals:  ? 04/24/21 0545 04/24/21 0645  ?BP: 131/74 (!) 193/113  ?Pulse: 78 79  ?Resp: 18   ?Temp:    ?SpO2: 95% 98%  ?  ?CONSTITUTIONAL: Chronically  ill-appearing, NAD ?NEURO/PSYCH:  Alert and oriented x 3, no focal deficits ?EYES:  eyes equal and reactive ?ENT/NECK:  no LAD, no JVD ?CARDIO: Regular rate, well-perfused, normal S1 and S2 ?PULM:  CTAB no wheezing or rhonchi ?GI/GU:  non-distended, non-tender ?MSK/SPINE:  No gross deformities, pitting edema to bilateral lower extremities ?SKIN:  no rash, atraumatic ? ? ?*Additional and/or pertinent findings included in MDM below ? ?Diagnostic and Interventional Summary  ? ? EKG Interpretation ? ?Date/Time:    ?Ventricular Rate:    ?PR Interval:    ?QRS Duration:   ?QT Interval:    ?QTC Calculation:   ?R Axis:     ?Text Interpretation:   ?  ? ?  ? ?Labs Reviewed  ?CBC - Abnormal; Notable for the following components:  ?    Result Value  ? RBC 3.61 (*)   ? Hemoglobin 10.3 (*)   ? HCT 34.1 (*)   ? All other components within normal limits  ?COMPREHENSIVE METABOLIC PANEL  ?LIPASE, BLOOD  ?  ?CT ABDOMEN PELVIS WO CONTRAST  ?Final Result  ?  ?MR LUMBAR SPINE WO CONTRAST    (Results Pending)  ?  ?Medications - No data to display  ? ?Procedures  /  Critical Care ?Procedures ? ?ED Course and Medical Decision Making  ?Initial Impression and Ddx ?Initial impression is mostly chronic pain presentation, leg pain, leg swelling, no signs of infection on the legs, favoring chronic venous insufficiency, doubt DVT.  Abdomen soft and nontender.  Obtaining screening labs given patient's recent history of anemia requiring blood transfusion, CT abdomen to exclude appendicitis or other surgical emergency. ? ?Past medical/surgical history that increases complexity of ED encounter: Chronic pain ? ?Interpretation of Diagnostics ?I personally reviewed the  laboratory assessment and my interpretation is as follows: No significant blood count or electrolyte disturbance ?   ?CT revealing diffuse L-spine abnormalities, possible discitis given these findings and patient's

## 2021-04-24 NOTE — H&P (Signed)
?History and Physical  ? ?Seeley Southgate Maestre ACZ:[id number] DOB: Oct 06, 1960 DOA: 04/24/2021 ? ?PCP: Lin Landsman, MD  ? ?Patient coming from: Home ? ?Chief Complaint: [place] pain and back pain ? ?HPI: TORRENCE BRANAGAN is a 61 y.o. female with medical history significant of diabetes, CAD, chronic pain, COPD, CHF, substance use in remission, carotid artery disease, schizophrenia, PTSD, gout, anemia, GERD, hyperlipidemia, seizure, OSA, cardiac arrest, anoxic brain injury, CKD 3B who presents with ongoing/worsening abdominal pain, back pain, leg weakness. ? ?Patient has chronic abdominal pain and back pain and is presenting today for persistent and somewhat worsened pain.  Initially was complaining more of abdominal pain and then later in her evaluation in the ED was complaining more about her back pain being worse with continued weakness in her lower extremities.  Also has history of chronic incontinence. ? ?She denies fevers, chills, chest pain, shortness of breath, constipation, nausea, vomiting. ? ?ED Course: Vital signs in the ED significant for blood pressure in the 109N to 235T systolic.  Lab work-up included CMP with ? ?Review of Systems: As per HPI otherwise all other systems reviewed and are negative. ? ?Past Medical History:  ?Diagnosis Date  ? Agitation 11/22/2017  ? Anoxic brain injury ([place]) 09/08/2016  ? C. Arrest due to respiratory failure and COPD exacerbation  ? Anxiety   ? Arthritis   ? "all over" (04/10/2016)  ? Asthma 10/18/2010  ? Binge eating disorder   ? Cardiac arrest ([place]) 09/08/2016  ? PEA  ? Carotid artery stenosis   ? 1-39% bilateral by dopplers 11/2016  ? Chronic diastolic (congestive) heart failure (HCC)   ? Chronic kidney disease, stage 3 (HCC)   ? Chronic pain syndrome 06/18/2012  ? Chronic post-traumatic stress disorder (PTSD) 05/27/2018  ? Chronic respiratory failure with hypoxia and hypercapnia ([place]) 06/22/2015  ? TRILOGY Vent >AVAPA-ES., Vt target 200-400, Max P 30 , PS max 20 , PS min 6-10 ,  E Max 6, E Min 4, Rate Auto AVAPS Rate 2 (titrate for pt comfort) , bleed O2 at 5l/m continuous flow .   ? Closed displaced fracture of fifth metacarpal bone 03/21/2018  ? Cocaine use disorder, severe, in sustained remission ([place]) 12/17/2015  ? Complication of anesthesia   ? decreased bp, decreased heart rate  ? COPD (chronic obstructive pulmonary disease) ([place]) 07/08/2014  ? Depression   ? Diabetic neuropathy ([place]) 04/24/2011  ? Difficulty with speech 01/24/2018  ? Disorder of nervous system   ? Drug abuse ([place]) 11/21/2017  ? Dyslipidemia 04/24/2011  ? Elevated troponin 04/28/2012  ? Emphysema   ? Encephalopathy 11/21/2017  ? Essential hypertension 03/22/2016  ? Fibula fracture 07/10/2016  ? Frequent falls 10/11/2017  ? GERD (gastroesophageal reflux disease)   ? Gout 04/11/2017  ? Heart attack ([place]) 1980s  ? History of blood transfusion 1994  ? "couldn't stop bleeding from my period"  ? History of drug abuse in remission ([place]) 11/28/2015  ? Quit in 2017  ? Hyperlipidemia LDL goal <70   ? Incontinence   ? Manic depression ([place])   ? Morbid obesity ([place]) 10/18/2010  ? Obstructive sleep apnea 10/18/2010  ? On home oxygen therapy   ? "6L; 24/7" (04/10/2016)  ? OSA on CPAP   ? "wear mask sometimes" (04/10/2016)  ? Paranoid ([place])   ? "sometimes; I'm on RX for it" (04/10/2016)  ? Prolonged Q-T interval on ECG   ? Rectal bleeding 12/31/2015  ? Schizoaffective disorder, bipolar type ([place]) 04/05/2018  ? Seasonal  allergies   ? Seborrheic keratoses 12/31/2013  ? Seizures ([place])   ? "don't know what kind; last one was ~ 1 yr ago" (04/10/2016)  ? Sinus bradycardia   ? Stroke [facility]) 1980s  ? denies residual on 04/10/2016  ? Thrush 09/19/2013  ? Type 2 diabetes mellitus ([place]) 10/18/2010  ? ? ?Past Surgical History:  ?Procedure Laterality Date  ? [place]  ? COLONOSCOPY WITH PROPOFOL N/A 04/01/2021  ? Procedure: COLONOSCOPY WITH PROPOFOL;  Surgeon: Carol Ada, MD;  Location: [facility];  Service: Gastroenterology;   Laterality: N/A;  Rectal bleeding with drop in hemoglobin to 7.2 g/dL  ? HERNIA REPAIR    ? IR CHOLANGIOGRAM EXISTING TUBE  07/20/2016  ? IR PERC CHOLECYSTOSTOMY  05/10/2016  ? IR RADIOLOGIST EVAL & MGMT  06/08/2016  ? IR RADIOLOGIST EVAL & MGMT  06/29/2016  ? IR SINUS/FIST TUBE CHK-NON GI  07/12/2016  ? RIGHT/LEFT HEART CATH AND CORONARY ANGIOGRAPHY N/A 06/19/2017  ? Procedure: RIGHT/LEFT HEART CATH AND CORONARY ANGIOGRAPHY;  Surgeon: Jolaine Artist, MD;  Location: [facility];  Service: Cardiovascular;  Laterality: N/A;  ? TIBIA IM NAIL INSERTION Right 07/12/2016  ? Procedure: INTRAMEDULLARY (IM) NAIL RIGHT TIBIA;  Surgeon: Leandrew Koyanagi, MD;  Location: [place];  Service: Orthopedics;  Laterality: Right;  ? UMBILICAL HERNIA REPAIR  ~ 1963  ? "that's why I don't have a belly button"  ? VAGINAL HYSTERECTOMY    ? ? ?Social History ? reports that she quit smoking about 5 years ago. Her smoking use included cigarettes. She started smoking about 44 years ago. She has a 57.00 pack-year smoking history. She has never used smokeless tobacco. She reports that she does not currently use drugs after having used the following drugs: Cocaine. She reports that she does not drink alcohol. ? ?Allergies  ?Allergen Reactions  ? Hydrocodone Shortness Of Breath  ? Anette Guarneri [Lurasidone Hcl] Anaphylaxis  ? Magnesium-Containing Compounds Anaphylaxis  ?  Tolerated Ensure  ? Prednisone Anaphylaxis, Swelling and Other (See Comments)  ?  Tongue swelling, lip swelling, throat swelling, per pt   ? Tramadol Anaphylaxis and Swelling  ? Codeine Nausea And Vomiting  ? Trazodone Other (See Comments)  ?  paranoia  ? Topamax [Topiramate] Other (See Comments)  ?  Increases paranoia  ? Sulfa Antibiotics Itching  ? Tape Rash  ? ? ?Family History  ?Problem Relation Age of Onset  ? Cancer Mother   ?     lung  ? Depression Mother   ? Cancer Father   ?     prostate  ? Depression Sister   ? Anxiety disorder Sister   ? Schizophrenia Sister   ? Bipolar disorder  Sister   ? Depression Sister   ? Depression Brother   ? Heart failure Other   ?     cousin  ? ?Potassium of 5.6 however slight hemolysis was noted, bicarb 21, creatinine elevated to 1.89 from baseline of around 1.5, alk phos 148.  CBC with hemoglobin stable at 10.3.  Lipase normal.  Respiratory panel for flu and COVID pending.  Imaging work-up in the ED including CT of the abdomen pelvis which showed increased endplate destruction of L3-L4 consistent with spondylodiscitis also with L3-4 anterior listhesis and severe worsening of spinal stenosis.  No appreciable abscess.  Stable adnexal cyst.  Constipation and diverticulosis without diverticulitis noted.  MRI of the T-spine and L-spine showed motion degraded exam but no acute thoracic abnormalities.  Anterior listhesis  at L4-5 with disc hyperintensity and endplate changes and edema consistent with instability versus chronic infection.  Also these changes were resulting in severe spinal stenosis.  Patient received oxycodone, Dilaudid, Ativan, blood pressure medications in the ED as well as a dose of 500 cc bolus.  Patient required multiple medications for sedation to get her MRI and was lethargic for period of time in the ED.  Was alert when I saw her.  Neurosurgery was consulted and patient was recommended for admission and psych consult to determine competency as she would be a possible neurosurgical candidate given her MRI changes.  Per EDP, neurosurgery plan to see the patient. ? ?Prior to Admission medications   ?Medication Sig Start Date End Date Taking? Authorizing Provider  ?albuterol (PROVENTIL) (2.5 MG/3ML) 0.083% nebulizer solution Take 3 mLs (2.5 mg total) by nebulization every 6 (six) hours as needed for wheezing or shortness of breath. 12/16/20   [name], MD  ?albuterol (VENTOLIN HFA) 108 (90 Base) MCG/ACT inhaler Inhale 2 puffs into the lungs every 6 (six) hours as needed for wheezing or shortness of breath. 02/09/21   [name], MD   ?amLODipine (NORVASC) 5 MG tablet Take 5 mg by mouth daily. 04/12/21   [provider]  ?Asenapine Maleate 10 MG SUBL Place 1 tablet (10 mg total) under the tongue 2 (two) times daily. 04/06/21   Adhikari

## 2021-04-24 NOTE — ED Notes (Signed)
MD notified about high bp reading, MD to follow up after MRI results are back.  ?

## 2021-04-24 NOTE — ED Provider Notes (Signed)
Assumed care of patient at 7 AM and patient here for chronic ongoing issues.  She did have a CT of her abdomen today that showed diffuse L-spine abnormalities which they reported could be from possible discitis.  MRIs were ordered however despite pain medication, anxiety medication patient is unable to lie down to get these images done.  She has no leukocytosis today.  She does have mild bump in her creatinine to 1.8 from her baseline of 1.5-1.6.  Based on my independent interpretation.  Her lipase is within normal limits and CBC also shows stable hemoglobin of 10.  On repeat evaluation patient is now very sleepy and responding only to deep stimuli.  We are now able to lie her flat and will try to get the MRI done.  Patient checked out to Dr. Langston Masker ?  ?Blanchie Dessert, MD ?04/24/21 1522 ? ?

## 2021-04-25 DIAGNOSIS — I6529 Occlusion and stenosis of unspecified carotid artery: Secondary | ICD-10-CM | POA: Diagnosis not present

## 2021-04-25 DIAGNOSIS — M109 Gout, unspecified: Secondary | ICD-10-CM | POA: Diagnosis not present

## 2021-04-25 DIAGNOSIS — Z515 Encounter for palliative care: Secondary | ICD-10-CM | POA: Diagnosis not present

## 2021-04-25 DIAGNOSIS — I10 Essential (primary) hypertension: Secondary | ICD-10-CM | POA: Diagnosis not present

## 2021-04-25 DIAGNOSIS — N1832 Chronic kidney disease, stage 3b: Secondary | ICD-10-CM | POA: Diagnosis not present

## 2021-04-25 DIAGNOSIS — E1122 Type 2 diabetes mellitus with diabetic chronic kidney disease: Secondary | ICD-10-CM | POA: Diagnosis not present

## 2021-04-25 DIAGNOSIS — M1000 Idiopathic gout, unspecified site: Secondary | ICD-10-CM | POA: Diagnosis not present

## 2021-04-25 DIAGNOSIS — Z7189 Other specified counseling: Secondary | ICD-10-CM | POA: Diagnosis not present

## 2021-04-25 DIAGNOSIS — G834 Cauda equina syndrome: Secondary | ICD-10-CM | POA: Diagnosis not present

## 2021-04-25 DIAGNOSIS — D631 Anemia in chronic kidney disease: Secondary | ICD-10-CM | POA: Diagnosis not present

## 2021-04-25 DIAGNOSIS — G894 Chronic pain syndrome: Secondary | ICD-10-CM | POA: Diagnosis not present

## 2021-04-25 DIAGNOSIS — K219 Gastro-esophageal reflux disease without esophagitis: Secondary | ICD-10-CM | POA: Diagnosis not present

## 2021-04-25 DIAGNOSIS — J439 Emphysema, unspecified: Secondary | ICD-10-CM | POA: Diagnosis not present

## 2021-04-25 DIAGNOSIS — J9611 Chronic respiratory failure with hypoxia: Secondary | ICD-10-CM | POA: Diagnosis not present

## 2021-04-25 DIAGNOSIS — M6281 Muscle weakness (generalized): Secondary | ICD-10-CM | POA: Diagnosis not present

## 2021-04-25 DIAGNOSIS — R293 Abnormal posture: Secondary | ICD-10-CM | POA: Diagnosis not present

## 2021-04-25 DIAGNOSIS — G931 Anoxic brain damage, not elsewhere classified: Secondary | ICD-10-CM | POA: Diagnosis not present

## 2021-04-25 DIAGNOSIS — I5032 Chronic diastolic (congestive) heart failure: Secondary | ICD-10-CM | POA: Diagnosis not present

## 2021-04-25 DIAGNOSIS — J449 Chronic obstructive pulmonary disease, unspecified: Secondary | ICD-10-CM | POA: Diagnosis not present

## 2021-04-25 DIAGNOSIS — N179 Acute kidney failure, unspecified: Secondary | ICD-10-CM | POA: Diagnosis not present

## 2021-04-25 DIAGNOSIS — F1421 Cocaine dependence, in remission: Secondary | ICD-10-CM | POA: Diagnosis present

## 2021-04-25 DIAGNOSIS — E875 Hyperkalemia: Secondary | ICD-10-CM | POA: Diagnosis not present

## 2021-04-25 DIAGNOSIS — I2510 Atherosclerotic heart disease of native coronary artery without angina pectoris: Secondary | ICD-10-CM | POA: Diagnosis not present

## 2021-04-25 DIAGNOSIS — F250 Schizoaffective disorder, bipolar type: Secondary | ICD-10-CM

## 2021-04-25 DIAGNOSIS — D649 Anemia, unspecified: Secondary | ICD-10-CM | POA: Diagnosis not present

## 2021-04-25 DIAGNOSIS — G8929 Other chronic pain: Secondary | ICD-10-CM | POA: Diagnosis not present

## 2021-04-25 DIAGNOSIS — Z7401 Bed confinement status: Secondary | ICD-10-CM | POA: Diagnosis not present

## 2021-04-25 DIAGNOSIS — F4312 Post-traumatic stress disorder, chronic: Secondary | ICD-10-CM | POA: Diagnosis present

## 2021-04-25 DIAGNOSIS — I5022 Chronic systolic (congestive) heart failure: Secondary | ICD-10-CM | POA: Diagnosis not present

## 2021-04-25 DIAGNOSIS — Z743 Need for continuous supervision: Secondary | ICD-10-CM | POA: Diagnosis not present

## 2021-04-25 DIAGNOSIS — R531 Weakness: Secondary | ICD-10-CM | POA: Diagnosis not present

## 2021-04-25 DIAGNOSIS — R109 Unspecified abdominal pain: Secondary | ICD-10-CM | POA: Diagnosis not present

## 2021-04-25 DIAGNOSIS — R32 Unspecified urinary incontinence: Secondary | ICD-10-CM | POA: Diagnosis present

## 2021-04-25 DIAGNOSIS — E119 Type 2 diabetes mellitus without complications: Secondary | ICD-10-CM | POA: Diagnosis not present

## 2021-04-25 DIAGNOSIS — J9612 Chronic respiratory failure with hypercapnia: Secondary | ICD-10-CM | POA: Diagnosis not present

## 2021-04-25 DIAGNOSIS — G4733 Obstructive sleep apnea (adult) (pediatric): Secondary | ICD-10-CM | POA: Diagnosis not present

## 2021-04-25 DIAGNOSIS — Z20822 Contact with and (suspected) exposure to covid-19: Secondary | ICD-10-CM | POA: Diagnosis not present

## 2021-04-25 DIAGNOSIS — I130 Hypertensive heart and chronic kidney disease with heart failure and stage 1 through stage 4 chronic kidney disease, or unspecified chronic kidney disease: Secondary | ICD-10-CM | POA: Diagnosis not present

## 2021-04-25 DIAGNOSIS — E1140 Type 2 diabetes mellitus with diabetic neuropathy, unspecified: Secondary | ICD-10-CM | POA: Diagnosis not present

## 2021-04-25 DIAGNOSIS — E785 Hyperlipidemia, unspecified: Secondary | ICD-10-CM | POA: Diagnosis not present

## 2021-04-25 LAB — CBC
HCT: 33.6 % — ABNORMAL LOW (ref 36.0–46.0)
Hemoglobin: 10.7 g/dL — ABNORMAL LOW (ref 12.0–15.0)
MCH: 29.4 pg (ref 26.0–34.0)
MCHC: 31.8 g/dL (ref 30.0–36.0)
MCV: 92.3 fL (ref 80.0–100.0)
Platelets: 300 10*3/uL (ref 150–400)
RBC: 3.64 MIL/uL — ABNORMAL LOW (ref 3.87–5.11)
RDW: 14.8 % (ref 11.5–15.5)
WBC: 5.2 10*3/uL (ref 4.0–10.5)
nRBC: 0 % (ref 0.0–0.2)

## 2021-04-25 LAB — GLUCOSE, CAPILLARY
Glucose-Capillary: 88 mg/dL (ref 70–99)
Glucose-Capillary: 96 mg/dL (ref 70–99)

## 2021-04-25 LAB — COMPREHENSIVE METABOLIC PANEL
ALT: 24 U/L (ref 0–44)
AST: 26 U/L (ref 15–41)
Albumin: 3.7 g/dL (ref 3.5–5.0)
Alkaline Phosphatase: 142 U/L — ABNORMAL HIGH (ref 38–126)
Anion gap: 6 (ref 5–15)
BUN: 24 mg/dL — ABNORMAL HIGH (ref 8–23)
CO2: 26 mmol/L (ref 22–32)
Calcium: 9.3 mg/dL (ref 8.9–10.3)
Chloride: 106 mmol/L (ref 98–111)
Creatinine, Ser: 1.1 mg/dL — ABNORMAL HIGH (ref 0.44–1.00)
GFR, Estimated: 57 mL/min — ABNORMAL LOW (ref >60)
Glucose, Bld: 87 mg/dL (ref 70–99)
Potassium: 4.3 mmol/L (ref 3.5–5.1)
Sodium: 138 mmol/L (ref 135–145)
Total Bilirubin: 0.4 mg/dL (ref 0.3–1.2)
Total Protein: 6.3 g/dL — ABNORMAL LOW (ref 6.5–8.1)

## 2021-04-25 LAB — CBG MONITORING, ED
Glucose-Capillary: 83 mg/dL (ref 70–99)
Glucose-Capillary: 76 mg/dL (ref 70–99)

## 2021-04-25 LAB — PROTIME-INR
INR: 1 (ref 0.8–1.2)
Prothrombin Time: 13.5 seconds (ref 11.4–15.2)

## 2021-04-25 LAB — BRAIN NATRIURETIC PEPTIDE: B Natriuretic Peptide: 48 pg/mL (ref 0.0–100.0)

## 2021-04-25 MED ORDER — OXYCODONE-ACETAMINOPHEN 5-325 MG PO TABS
1.0000 | ORAL_TABLET | Freq: Four times a day (QID) | ORAL | Status: DC | PRN
  Administered 2021-04-25 – 2021-04-30 (×4): 1 via ORAL
  Filled 2021-04-25 (×6): qty 1

## 2021-04-25 MED ORDER — CYCLOBENZAPRINE HCL 10 MG PO TABS
5.0000 mg | ORAL_TABLET | Freq: Three times a day (TID) | ORAL | Status: DC | PRN
  Administered 2021-04-25 – 2021-04-26 (×2): 5 mg via ORAL
  Filled 2021-04-25 (×4): qty 1

## 2021-04-25 MED ORDER — ESCITALOPRAM OXALATE 10 MG PO TABS
20.0000 mg | ORAL_TABLET | Freq: Every morning | ORAL | Status: DC
  Administered 2021-04-26 – 2021-05-02 (×6): 20 mg via ORAL
  Filled 2021-04-25 (×8): qty 2

## 2021-04-25 MED ORDER — SODIUM CHLORIDE 0.9 % IV SOLN
25.0000 mg | INTRAVENOUS | Status: DC | PRN
  Filled 2021-04-25: qty 0.5

## 2021-04-25 MED ORDER — DULOXETINE HCL 30 MG PO CPEP
30.0000 mg | ORAL_CAPSULE | Freq: Two times a day (BID) | ORAL | Status: DC
  Administered 2021-04-25 – 2021-05-02 (×14): 30 mg via ORAL
  Filled 2021-04-25 (×15): qty 1

## 2021-04-25 MED ORDER — TORSEMIDE 20 MG PO TABS
20.0000 mg | ORAL_TABLET | Freq: Every day | ORAL | Status: DC
  Administered 2021-04-25 – 2021-04-26 (×2): 20 mg via ORAL
  Filled 2021-04-25 (×2): qty 1

## 2021-04-25 MED ORDER — DICYCLOMINE HCL 10 MG PO CAPS
10.0000 mg | ORAL_CAPSULE | Freq: Three times a day (TID) | ORAL | Status: DC
  Administered 2021-04-25 – 2021-05-02 (×20): 10 mg via ORAL
  Filled 2021-04-25 (×24): qty 1

## 2021-04-25 NOTE — Consult Note (Signed)
[facility] Face-to-Face Psychiatry Consult  ? ?Reason for Consult:  capacity ?Referring Physician:  Dr. Trilby Drummer ?Patient Identification: Tiffany [name] ?MRN:  [id number] ?Principal Diagnosis: Cauda equina compression ([place]) ?Diagnosis:  Principal Problem: ?  Cauda equina compression ([place]) ?Active Problems: ?  Schizoaffective disorder, bipolar type ([place]) ?  Type 2 diabetes mellitus ([place]) ?  Dyslipidemia ?  Anemia ?  Chronic pain syndrome ?  Seizure ([place]) ?  COPD (chronic obstructive pulmonary disease) ([place]) ?  Chronic diastolic congestive heart failure ([place]) ?  History of drug abuse in remission [facility]) ?  Essential hypertension ?  OSA and COPD overlap syndrome ([place]) ?  Anoxic brain injury ([place]) ?  Carotid artery stenosis ?  Stage 3b chronic kidney disease (CKD) ([place]) ?  Gout ?  PTSD (post-traumatic stress disorder) ?  Acute renal failure superimposed on stage 3b chronic kidney disease ([place]) ?  Schizophrenia, schizo-affective ([place]) ?  GERD (gastroesophageal reflux disease) ? ? ?Total Time spent with patient: 1 hour ? ?Subjective:   ?Tiffany [name] is a 61 y.o. female patient admitted with abdominial and leg pains, consult for medical capacity decision making skills. ? ?HPI:  61 yo female with abdominal and leg pains, history of schizoaffective disorder.  Capacity consult request.  [name] alert and oriented times three.  When asked about the blood drawing refusal, she reports being frustrated at their attempts, "The woman did not know what she was doing."  [name] agreeable tot get help for her medical issues and this is the reason she came to the ED.  The client lives alone and has not driven "in awhile".  Her 11 yo mother or her home health aide brings her food and other things she needs.  Her children live out of town.  [name] cooperative and has capacity to make her own medical decisions.  History of schizoaffective disorder and medications reviewed.   ? ?Past Psychiatric History: schizoaffective disorder ? ?Risk to Self:   none ?Risk to Others:  none ?Prior Inpatient Therapy:  not recently for mental health ?Prior Outpatient Therapy:  yes ? ?Past Medical History:  ?Past Medical History:  ?Diagnosis Date  ? Agitation 11/22/2017  ? Anoxic brain injury ([place]) 09/08/2016  ? C. Arrest due to respiratory failure and COPD exacerbation  ? Anxiety   ? Arthritis   ? "all over" (04/10/2016)  ? Asthma 10/18/2010  ? Binge eating disorder   ? Cardiac arrest ([place]) 09/08/2016  ? PEA  ? Carotid artery stenosis   ? 1-39% bilateral by dopplers 11/2016  ? Chronic diastolic (congestive) heart failure (HCC)   ? Chronic kidney disease, stage 3 (HCC)   ? Chronic pain syndrome 06/18/2012  ? Chronic post-traumatic stress disorder (PTSD) 05/27/2018  ? Chronic respiratory failure with hypoxia and hypercapnia ([place]) 06/22/2015  ? TRILOGY Vent >AVAPA-ES., Vt target 200-400, Max P 30 , PS max 20 , PS min 6-10 , E Max 6, E Min 4, Rate Auto AVAPS Rate 2 (titrate for pt comfort) , bleed O2 at 5l/m continuous flow .   ? Closed displaced fracture of fifth metacarpal bone 03/21/2018  ? Cocaine use disorder, severe, in sustained remission ([place]) 12/17/2015  ? Complication of anesthesia   ? decreased bp, decreased heart rate  ? COPD (chronic obstructive pulmonary disease) ([place]) 07/08/2014  ? Depression   ? Diabetic neuropathy ([place]) 04/24/2011  ? Difficulty with speech 01/24/2018  ? Disorder of nervous system   ? Drug abuse ([place]) 11/21/2017  ? Dyslipidemia 04/24/2011  ?  Elevated troponin 04/28/2012  ? Emphysema   ? Encephalopathy 11/21/2017  ? Essential hypertension 03/22/2016  ? Fibula fracture 07/10/2016  ? Frequent falls 10/11/2017  ? GERD (gastroesophageal reflux disease)   ? Gout 04/11/2017  ? Heart attack ([place]) 1980s  ? History of blood transfusion 1994  ? "couldn't stop bleeding from my period"  ? History of drug abuse in remission ([place]) 11/28/2015  ? Quit in 2017  ? Hyperlipidemia LDL goal <70   ? Incontinence   ? Manic depression ([place])   ? Morbid obesity ([place])  10/18/2010  ? Obstructive sleep apnea 10/18/2010  ? On home oxygen therapy   ? "6L; 24/7" (04/10/2016)  ? OSA on CPAP   ? "wear mask sometimes" (04/10/2016)  ? Paranoid ([place])   ? "sometimes; I'm on RX for it" (04/10/2016)  ? Prolonged Q-T interval on ECG   ? Rectal bleeding 12/31/2015  ? Schizoaffective disorder, bipolar type ([place]) 04/05/2018  ? Seasonal allergies   ? Seborrheic keratoses 12/31/2013  ? Seizures ([place])   ? "don't know what kind; last one was ~ 1 yr ago" (04/10/2016)  ? Sinus bradycardia   ? Stroke [facility]) 1980s  ? denies residual on 04/10/2016  ? Thrush 09/19/2013  ? Type 2 diabetes mellitus ([place]) 10/18/2010  ?  ?Past Surgical History:  ?Procedure Laterality Date  ? [place]  ? COLONOSCOPY WITH PROPOFOL N/A 04/01/2021  ? Procedure: COLONOSCOPY WITH PROPOFOL;  Surgeon: Carol Ada, MD;  Location: [place];  Service: Gastroenterology;  Laterality: N/A;  Rectal bleeding with drop in hemoglobin to 7.2 g/dL  ? HERNIA REPAIR    ? IR CHOLANGIOGRAM EXISTING TUBE  07/20/2016  ? IR PERC CHOLECYSTOSTOMY  05/10/2016  ? IR RADIOLOGIST EVAL & MGMT  06/08/2016  ? IR RADIOLOGIST EVAL & MGMT  06/29/2016  ? IR SINUS/FIST TUBE CHK-NON GI  07/12/2016  ? RIGHT/LEFT HEART CATH AND CORONARY ANGIOGRAPHY N/A 06/19/2017  ? Procedure: RIGHT/LEFT HEART CATH AND CORONARY ANGIOGRAPHY;  Surgeon: Jolaine Artist, MD;  Location: [facility];  Service: Cardiovascular;  Laterality: N/A;  ? TIBIA IM NAIL INSERTION Right 07/12/2016  ? Procedure: INTRAMEDULLARY (IM) NAIL RIGHT TIBIA;  Surgeon: Leandrew Koyanagi, MD;  Location: [place] [facility];  Service: Orthopedics;  Laterality: Right;  ? UMBILICAL HERNIA REPAIR  ~ 1963  ? "that's why I don't have a belly button"  ? VAGINAL HYSTERECTOMY    ? ?Family History:  ?Family History  ?Problem Relation Age of Onset  ? Cancer Mother   ?     lung  ? Depression Mother   ? Cancer Father   ?     prostate  ? Depression Sister   ? Anxiety disorder Sister   ? Schizophrenia Sister   ? Bipolar disorder  Sister   ? Depression Sister   ? Depression Brother   ? Heart failure Other   ?     cousin  ? ?Family Psychiatric  History: see above ?Social History:  ?Social History  ? ?Substance and Sexual Activity  ?Alcohol Use No  ? Alcohol/week: 0.0 standard drinks  ?   ?Social History  ? ?Substance and Sexual Activity  ?Drug Use Not Currently  ? Types: Cocaine  ? Comment: 04/10/2016 "last used cocaine back in November 2017"  ?  ?Social History  ? ?Socioeconomic History  ? Marital status: Widowed  ?  Spouse name: Not on file  ? Number of children: 3  ? Years of education: Not on file  ?  Highest education level: Not on file  ?Occupational History  ? Occupation: disabled  ?  Comment: factory production  ?Tobacco Use  ? Smoking status: Former  ?  Packs/day: 1.50  ?  Years: 38.00  ?  Pack years: 57.00  ?  Types: Cigarettes  ?  Start date: 03/13/1977  ?  Quit date: 04/10/2016  ?  Years since quitting: 5.0  ? Smokeless tobacco: Never  ?Vaping Use  ? Vaping Use: Never used  ?Substance and Sexual Activity  ? Alcohol use: No  ?  Alcohol/week: 0.0 standard drinks  ? Drug use: Not Currently  ?  Types: Cocaine  ?  Comment: 04/10/2016 "last used cocaine back in November 2017"  ? Sexual activity: Not Currently  ?  Birth control/protection: Surgical  ?Other Topics Concern  ? Not on file  ?Social History Narrative  ? Has 1 son, [name]  ? Lives with son and his boyfriend  ? Her house has ramps and handrails should she ever needs them.   ? Her mother lives down the street from her and is a good support person in addition to her son.  ? She drives herself, has private transportation.   ? Cocaine free since 02/24/16, smoke free since 04/10/16  ? ?Social Determinants of Health  ? ?Financial Resource Strain: Not on file  ?Food Insecurity: Not on file  ?Transportation Needs: Not on file  ?Physical Activity: Not on file  ?Stress: Not on file  ?Social Connections: Not on file  ? ?Additional Social History: ?  ? ?Allergies:   ?Allergies  ?Allergen Reactions   ? Hydrocodone Shortness Of Breath  ? Anette Guarneri [Lurasidone Hcl] Anaphylaxis  ? Magnesium-Containing Compounds Anaphylaxis  ?  Tolerated Ensure  ? Prednisone Anaphylaxis, Swelling and Other (See Comments)  ?  Tongue s

## 2021-04-25 NOTE — ED Notes (Signed)
This RN woke pt up for med admin. Pt given morning medication. Compliant w/ med admin. Pt A&Ox4. Warm blanket given. Denies any needs at this time. ?

## 2021-04-25 NOTE — Progress Notes (Signed)
Patient ID: Tiffany [name], female   DOB: 18-Feb-1960, 61 y.o.   MRN: [id number] ?BP (!) 154/82   Pulse 91   Temp 98.3 ?F (36.8 ?C)   Resp 14   SpO2 98%  ?Called yesterday about [name]. She has severe stenosis, and I think anatomically would do well with a decompression, but after trying to examine her I do not believe she is a good candidate. [name] exceedingly lethargic, is not following any commands, and has not been compliant with the ED staff. I am not inclined to believe that her post op care would be adequate. This would place her at great risk with hardware in place and the need for close follow up. I believe she needs a psychiatric consultation to determine her decision making ability, and to determine if she is reasonably felt to be able to provide care for herself.  ?

## 2021-04-25 NOTE — ED Notes (Signed)
[facility] called for transport needs.  

## 2021-04-25 NOTE — ED Notes (Signed)
Neuro surgery at bedside.

## 2021-04-25 NOTE — ED Notes (Signed)
Pt sleeping. 

## 2021-04-25 NOTE — Progress Notes (Signed)
?PROGRESS NOTE ? ? ? ?Tiffany [name]  DDU:[id number] DOB: October 25, 1960 DOA: 04/24/2021 ?PCP: Lin Landsman, MD  ? ?Brief Narrative:  ?61 y.o. female with medical history significant of diabetes, CAD, chronic pain, COPD, CHF, substance use in remission, carotid artery disease, schizophrenia, PTSD, gout, anemia, GERD, hyperlipidemia, seizure, OSA, cardiac arrest, anoxic brain injury, CKD 3B presented with worsening abdominal pain, back pain and leg weakness.  On presentation, creatinine was 1.89. CT of the abdomen pelvis:increased endplate destruction of L3-L4 consistent with spondylodiscitis also with L3-4 anterior listhesis and severe worsening of spinal stenosis; No appreciable abscess; Stable adnexal cyst; Constipation and diverticulosis without diverticulitis noted.  MRI thoracic spine did not show any acute pathology.  MRI lumbar spine was concerning for cauda equina syndrome.  ED provider consulted neurosurgery who recommended transfer to [facility] and psychiatric consultation to determine competency. ? ?Assessment & Plan: ?  ?Concern for cauda equina syndrome ?-Patient presented with worsening abdominal pain, back pain and leg weakness. ?-Imaging findings as above. ?-ED provider consulted neurosurgery who recommended transfer to [facility] and psychiatric consultation to determine competency.  Pending transfer to [facility].  Pending psychiatry evaluation. ?-Continue pain management. ?-Fall precautions ? ?AKI on CKD stage IIIb ?-Creatinine 1.89 on presentation.  Improving to creatinine of 1.10 today.  Monitor treated with IV fluids. ? ?Hyperkalemia ?-Questionable cause.  Resolved ? ?Anemia of chronic disease ?-Hemoglobin stable ? ?Chronic pain ?-Continue Dilaudid as needed.  Continue gabapentin ? ?Chronic diastolic CHF ?- last echo in 12/2020 showed EF of 60 to 65% with grade 1 diastolic dysfunction. ?-No signs of volume overload.  Strict input and output.  Daily weights.  Fluid  restriction.  Continue Coreg.  Outpatient follow-up with cardiology ? ?CAD ?Hyperlipidemia ?-Currently stable.  Continue Coreg and statin.  Outpatient follow-up with cardiology.  Currently not on antiplatelets ? ?OSA ?-Continue home CPAP ? ?Schizophrenia ?PTSD ?History of psychosis ?-Psychiatry evaluation.  Currently on asenapine, prazosin and gabapentin. ? ?History of anoxic brain injury ?History of cardiac arrest ?-Palliative care consulted for goals of care discussion. ? ?Hypertension ?-Blood pressure intermittently elevated.  Continue amlodipine, Coreg ? ?Physical deconditioning ?-We will get PT evaluation once cleared by neurosurgery ? ?DVT prophylaxis: SCDs ?Code Status: Full ?Family Communication: None at bedside ?Disposition Plan: ?Status is: Observation ?The patient will require care spanning > 2 midnights and should be moved to inpatient because: Of need for neurosurgical evaluation/psychiatry consultation ? ? ? ?Consultants: Neurosurgery/psychiatry ? ?Procedures: None ? ?Antimicrobials: None ? ? ?Subjective: ?Patient seen and examined at bedside.  Wakes up slightly, does not answer most questions.  Poor historian.  No overnight fever, seizures or vomiting reported. ? ?Objective: ?Vitals:  ? 04/25/21 0400 04/25/21 0600 04/25/21 0800 04/25/21 1100  ?BP: (!) 149/94 (!) 154/82 140/85 (!) 120/100  ?Pulse: 86 91 88 78  ?Resp: '13 14 13 13  '$ ?Temp:      ?TempSrc:      ?SpO2: 100% 98% 99% 99%  ? ? ?Intake/Output Summary (Last 24 hours) at 04/25/2021 1148 ?Last data filed at 04/25/2021 2376 ?Gross per 24 hour  ?Intake --  ?Output 1900 ml  ?Net -1900 ml  ? ?There were no vitals filed for this visit. ? ?Examination: ? ?General exam: No distress.  Currently on 2 L oxygen by nasal cannula.  Looks chronically ill and deconditioned. ?Respiratory system: Bilateral decreased breath sounds at bases with some scattered crackles ?Cardiovascular system: S1 & S2 heard, Rate controlled ?Gastrointestinal system: Abdomen is  nondistended, soft  and nontender. Normal bowel sounds heard. ?Extremities: No cyanosis, clubbing; trace lower extremity edema ?Central nervous system: Wakes up slightly, extremely slow to respond.  Poor historian.  No focal neurological deficits. Moving extremities ?Skin: No rashes, lesions or ulcers ?Psychiatry: Extremely flat affect.  Does not participate in conversation much. ? ? ?Data Reviewed: I have personally reviewed following labs and imaging studies ? ?CBC: ?Recent Labs  ?Lab 04/20/21 ?0905 04/24/21 ?0500 04/25/21 ?0245  ?WBC 6.9 6.0 5.2  ?HGB 10.6* 10.3* 10.7*  ?HCT 33.9* 34.1* 33.6*  ?MCV 93.1 94.5 92.3  ?PLT 403* 320 300  ? ?Basic Metabolic Panel: ?Recent Labs  ?Lab 04/20/21 ?0905 04/24/21 ?5726 04/25/21 ?0245  ?NA 143 137 138  ?K 3.4* 5.6* 4.3  ?CL 109 106 106  ?CO2 25 21* 26  ?GLUCOSE 104* 82 87  ?BUN 20 31* 24*  ?CREATININE 1.58* 1.89* 1.10*  ?CALCIUM 9.6 9.7 9.3  ? ?GFR: ?CrCl cannot be calculated (Unknown ideal weight.). ?Liver Function Tests: ?Recent Labs  ?Lab 04/24/21 ?0651 04/25/21 ?0245  ?AST 34 26  ?ALT 27 24  ?ALKPHOS 146* 142*  ?BILITOT 0.3 0.4  ?PROT 7.1 6.3*  ?ALBUMIN 4.0 3.7  ? ?Recent Labs  ?Lab 04/24/21 ?2035  ?LIPASE 31  ? ?No results for input(s): AMMONIA in the last 168 hours. ?Coagulation Profile: ?Recent Labs  ?Lab 04/25/21 ?0645  ?INR 1.0  ? ?Cardiac Enzymes: ?No results for input(s): CKTOTAL, CKMB, CKMBINDEX, TROPONINI in the last 168 hours. ?BNP (last 3 results) ?Recent Labs  ?  06/04/20 ?1140 06/15/20 ?1636 07/13/20 ?1310  ?PROBNP 59,741* 2,313* 201  ? ?HbA1C: ?No results for input(s): HGBA1C in the last 72 hours. ?CBG: ?Recent Labs  ?Lab 04/24/21 ?2218 04/25/21 ?6384 04/25/21 ?1138  ?GLUCAP 76 83 76  ? ?Lipid Profile: ?No results for input(s): CHOL, HDL, LDLCALC, TRIG, CHOLHDL, LDLDIRECT in the last 72 hours. ?Thyroid Function Tests: ?No results for input(s): TSH, T4TOTAL, FREET4, T3FREE, THYROIDAB in the last 72 hours. ?Anemia Panel: ?No results for input(s): VITAMINB12,  FOLATE, FERRITIN, TIBC, IRON, RETICCTPCT in the last 72 hours. ?Sepsis Labs: ?No results for input(s): PROCALCITON, LATICACIDVEN in the last 168 hours. ? ?Recent Results (from the past 240 hour(s))  ?Resp Panel by RT-PCR (Flu A&B, Covid) Nasopharyngeal Swab     Status: None  ? Collection Time: 04/24/21  7:31 PM  ? Specimen: Nasopharyngeal Swab; Nasopharyngeal(NP) swabs in vial transport medium  ?Result Value Ref Range Status  ? SARS Coronavirus 2 by RT PCR NEGATIVE NEGATIVE Final  ?  Comment: (NOTE) ?SARS-CoV-2 target nucleic acids are NOT DETECTED. ? ?The SARS-CoV-2 RNA is generally detectable in upper respiratory ?specimens during the acute phase of infection. The lowest ?concentration of SARS-CoV-2 viral copies this assay can detect is ?138 copies/mL. A negative result does not preclude SARS-Cov-2 ?infection and should not be used as the sole basis for treatment or ?other patient management decisions. A negative result may occur with  ?improper specimen collection/handling, submission of specimen other ?than nasopharyngeal swab, presence of viral mutation(s) within the ?areas targeted by this assay, and inadequate number of viral ?copies(<138 copies/mL). A negative result must be combined with ?clinical observations, patient history, and epidemiological ?information. The expected result is Negative. ? ?Fact Sheet for Patients:  ?EntrepreneurPulse.com.au ? ?Fact Sheet for Healthcare Providers:  ?IncredibleEmployment.be ? ?This test is no t yet approved or cleared by the [place] FDA and  ?has been authorized for detection and/or diagnosis of SARS-CoV-2 by ?FDA under an Emergency Use Authorization (EUA). This EUA  will remain  ?in effect (meaning this test can be used) for the duration of the ?COVID-19 declaration under Section 564(b)(1) of the Act, 21 ?U.S.C.section 360bbb-3(b)(1), unless the authorization is terminated  ?or revoked sooner.  ? ? ?  ? Influenza A by PCR  NEGATIVE NEGATIVE Final  ? Influenza B by PCR NEGATIVE NEGATIVE Final  ?  Comment: (NOTE) ?The Xpert Xpress SARS-CoV-2/FLU/RSV plus assay is intended as an aid ?in the diagnosis of influenza from Nasopharyngeal swab specimens

## 2021-04-25 NOTE — Evaluation (Signed)
Physical Therapy Evaluation ?Patient Details ?Name: Tiffany [name] ?MRN: [id number] ?DOB: 1960-02-14 ?Today's Date: 04/25/2021 ? ?History of Present Illness ? Pt is 61 yo female admitted on 04/24/21 with concern for cauda equina syndrome. Neurosurgery consulted and felt pt would benefit anatomically from surgery but may not be a good candidate due to psychiatric concerns (psych consult ordered).  Pt with hx of diabetes, CAD, chronic pain, COPD, CHF, substance use in remission, carotid artery disease, schizophrenia, PTSD, gout, anemia, GERD, hyperlipidemia, seizure, OSA, cardiac arrest, anoxic brain injury, CKD 3B ?  ?Clinical Impression ? Pt admitted with above diagnosis. Pt has had gradually weakening of LE the past few months (since February) to the point of being unable to ambulate or transfer.  In December, she was ambulating with supervision during PT evaluation.  Today, pt with significantly decreased strength in bil LE (only 1/5 bil quads, hip IR, hip ADD; all other 0/5) and decreased sensation.  Pt required mod A x 2 for transfer to EOB and unable to attempt standing b/c too weak, unable to try scoot to chair b/c in ED.  She was able to use UE to scoot up in bed.  Noted neurosurgery consulted and awaiting input from psych prior to surgerical management.  At this time pt would benefit from PT services to works on transfers, mobility, and strengthening - will focus on lateral or anterior/posterior scoots as pt does not have LE strength to attempt standing (currently no standing goals).  If pt ends up having surgery - will likely need goal and frequency update.  Pt currently with functional limitations due to the deficits listed below (see PT Problem List). Pt will benefit from skilled PT to increase their independence and safety with mobility to allow discharge to the venue listed below.   ?   ?   ? ?Recommendations for follow up therapy are one component of a multi-disciplinary discharge planning process, led by  the attending physician.  Recommendations may be updated based on patient status, additional functional criteria and insurance authorization. ? ?Follow Up Recommendations Skilled nursing-short term rehab (<3 hours/day) ? ?  ?Assistance Recommended at Discharge Frequent or constant Supervision/Assistance  ?Patient can return home with the following ? Two people to help with walking and/or transfers;Two people to help with bathing/dressing/bathroom;Help with stairs or ramp for entrance;Assistance with cooking/housework ? ?  ?Equipment Recommendations Other (comment) (defer to post acute, but if home hoyer lift; reports has been fit for power w/c)  ?Recommendations for Other Services ?    ?  ?Functional Status Assessment Patient has had a recent decline in their functional status and demonstrates the ability to make significant improvements in function in a reasonable and predictable amount of time.  ? ?  ?Precautions / Restrictions Precautions ?Precautions: Fall;Other (comment) ?Precaution Comments: back precautions due to cauda equina  ? ?  ? ?Mobility ? Bed Mobility ?Overal bed mobility: Needs Assistance ?Bed Mobility: Supine to Sit, Sit to Supine ?  ?  ?Supine to sit: Mod assist, +2 for physical assistance ?Sit to supine: Mod assist, +2 for physical assistance ?  ?General bed mobility comments: Requriing full assist for bil LE and assist to lift trunk and scoot forward. ?  ? ?Transfers ?Overall transfer level: Needs assistance ?  ?  ?  ?  ?  ?  ?  ?  ?General transfer comment: Minimal lateral scooting at EOB with cues and assist.  Unable to attempt bed/chair via lateral scoot due to in ED on stretcher,  no recliner. Once back in bed pt did use UE to scoot up in bed. ?  ? ?Ambulation/Gait ?  ?  ?  ?  ?  ?  ?  ?  ? ?Stairs ?  ?  ?  ?  ?  ? ?Wheelchair Mobility ?  ? ?Modified Rankin (Stroke Patients Only) ?  ? ?  ? ?Balance Overall balance assessment: Needs assistance ?Sitting-balance support: No upper extremity  supported, Feet unsupported ?Sitting balance-Leahy Scale: Fair ?Sitting balance - Comments: Could sit EOB statically ?  ?  ?Standing balance-Leahy Scale: Zero ?Standing balance comment: unable ?  ?  ?  ?  ?  ?  ?  ?  ?  ?  ?  ?   ? ? ? ?Pertinent Vitals/Pain Pain Assessment ?Pain Assessment: 0-10 ?Pain Score: 7  ?Pain Location: mid trunk down ?Pain Descriptors / Indicators: Discomfort ?Pain Intervention(s): Limited activity within patient's tolerance, Monitored during session  ? ? ?Home Living Family/patient expects to be discharged to:: Skilled nursing facility ?Living Arrangements: Alone ?Available Help at Discharge: Personal care attendant;Family;Available PRN/intermittently (aide 7 days a week 3-4 hr/day; mother also assisted (but mother is 41 yo)) ?Type of Home: Apartment ?Home Access: Level entry ?  ?  ?  ?Home Layout: One level ?Home Equipment: Rollator (4 wheels);BSC/3in1 ?Additional Comments: Reports has trapeze bar at home.  She was sleeping on a couch.  Reports has been fit for electric w/c but does not have yet  ?  ?Prior Function Prior Level of Function : Needs assist ?  ?  ?  ?  ?  ?  ?Mobility Comments: Has had difficulty with walking/transfers since February.  In December, she walked with supervision with therapy.  Reports frequent falls (maybe 7-10 last 6 months) and has to call EMS to get her up. ?ADLs Comments: Reports trouble since February with ADLs. Aide and mother assist with ADLs (lower body, pt could basically do upper body) ?  ? ? ?Hand Dominance  ? Dominant Hand: Right ? ?  ?Extremity/Trunk Assessment  ? Upper Extremity Assessment ?Upper Extremity Assessment: Defer to OT evaluation (was able to use UE to assist with transfer) ?  ? ?Lower Extremity Assessment ?Lower Extremity Assessment: LLE deficits/detail;RLE deficits/detail ?RLE Deficits / Details: ROM WFL; Extremely weak; had foot drop in December; currently only minimal quad contraction, hip IR, and hip ADD all 1/5 - all other 0/5 ?RLE  Sensation: decreased light touch (can only feel deep pressure) ?LLE Deficits / Details: ROM WFL; Extremely weak; had foot drop in December; currently only minimal quad contraction, hip IR, and hip ADD all 1/5 - all other 0/5 ?LLE Sensation: decreased light touch (can only feel deep pressure) ?  ? ?Cervical / Trunk Assessment ?Cervical / Trunk Assessment: Kyphotic  ?Communication  ? Communication: No difficulties  ?Cognition Arousal/Alertness: Awake/alert ?Behavior During Therapy: Anxious (tearful at times over condition) ?Overall Cognitive Status: Within Functional Limits for tasks assessed ?  ?  ?  ?  ?  ?  ?  ?  ?  ?  ?  ?  ?  ?  ?  ?  ?General Comments: Pt alert and oriented x 4; followed basic commands ?  ?  ? ?  ?General Comments General comments (skin integrity, edema, etc.): vss ? ?  ?Exercises    ? ?Assessment/Plan  ?  ?PT Assessment Patient needs continued PT services  ?PT Problem List Decreased strength;Decreased range of motion;Decreased activity tolerance;Decreased balance;Decreased mobility;Decreased coordination;Decreased cognition;Decreased knowledge of use  of DME;Decreased safety awareness;Pain ? ?   ?  ?PT Treatment Interventions DME instruction;Functional mobility training;Therapeutic activities;Therapeutic exercise;Balance training;Neuromuscular re-education;Cognitive remediation;Patient/family education;Wheelchair mobility training   ? ?PT Goals (Current goals can be found in the Care Plan section)  ?Acute Rehab PT Goals ?Patient Stated Goal: "I want to move more" ?PT Goal Formulation: With patient ?Time For Goal Achievement: 05/09/21 ?Potential to Achieve Goals: Fair ? ?  ?Frequency Min 2X/week ?  ? ? ?Co-evaluation PT/OT/SLP Co-Evaluation/Treatment: Yes ?Reason for Co-Treatment: For patient/therapist safety (In ED, cauda equina syndrome) ?  ?  ?  ? ? ?  ?AM-PAC PT "6 Clicks" Mobility  ?Outcome Measure Help needed turning from your back to your side while in a flat bed without using bedrails?: A  Little ?Help needed moving from lying on your back to sitting on the side of a flat bed without using bedrails?: Total ?Help needed moving to and from a bed to a chair (including a wheelchair)?: Total ?He

## 2021-04-25 NOTE — Progress Notes (Signed)
RT note: Patient refusing CPAP at this time. ?

## 2021-04-25 NOTE — Evaluation (Signed)
Occupational Therapy Evaluation ?Patient Details ?Name: Tiffany [name] ?MRN: [id number] ?DOB: 26-Feb-1960 ?Today's Date: 04/25/2021 ? ? ?History of Present Illness Pt is 61 yo female admitted on 04/24/21 with concern for cauda equina syndrome. Neurosurgery consulted and felt pt would benefit anatomically from surgery but may not be a good candidate due to psychiatric concerns (psych consult ordered).  Pt with hx of diabetes, CAD, chronic pain, COPD, CHF, substance use in remission, carotid artery disease, schizophrenia, PTSD, gout, anemia, GERD, hyperlipidemia, seizure, OSA, cardiac arrest, anoxic brain injury, CKD 3B  ? ?Clinical Impression ?  ?Patient is a 61 year old female who was living at home with family and some caregiver support prior level. Currently, patient is max A x2 for supine to sit on edge of bed with increased pain noted with all movements. Patient was noted to have decreased functional activity tolerance,decreased endurance, decreased sitting balance,increased pain, and decreased safety awareness impacting participation in [place]. Patient would need 24/7 physical caregiver assistance to be successful in the next level of care. Patient would continue to benefit from skilled OT services at this time while admitted and after d/c to address noted deficits in order to improve overall safety and independence in ADLs.  ? ?   ? ?Recommendations for follow up therapy are one component of a multi-disciplinary discharge planning process, led by the attending physician.  Recommendations may be updated based on patient status, additional functional criteria and insurance authorization.  ? ?Follow Up Recommendations ? Skilled nursing-short term rehab (<3 hours/day)  ?  ?Assistance Recommended at Discharge Frequent or constant Supervision/Assistance  ?Patient can return home with the following A lot of help with walking and/or transfers;A lot of help with bathing/dressing/bathroom;Assistance with  cooking/housework;Assistance with feeding;Direct supervision/assist for medications management;Direct supervision/assist for financial management;Assist for transportation;Help with stairs or ramp for entrance ? ?  ?Functional Status Assessment ? Patient has had a recent decline in their functional status and demonstrates the ability to make significant improvements in function in a reasonable and predictable amount of time.  ?Equipment Recommendations ? None recommended by OT  ?  ?Recommendations for Other Services   ? ? ?  ?Precautions / Restrictions Precautions ?Precautions: Fall;Other (comment) ?Precaution Comments: back precautions due to cauda equina ?Restrictions ?Weight Bearing Restrictions: No  ? ?  ? ?Mobility Bed Mobility ?Overal bed mobility: Needs Assistance ?Bed Mobility: Supine to Sit, Sit to Supine ?Rolling: Max assist, +2 for physical assistance, +2 for safety/equipment ?  ?Supine to sit: Mod assist, +2 for physical assistance ?Sit to supine: Mod assist, +2 for physical assistance ?  ?General bed mobility comments: Requriing full assist for bil LE and assist to lift trunk and scoot forward. ?  ? ?Transfers ?  ?  ?  ?  ?  ?  ?  ?  ?  ?  ?  ? ?  ?Balance Overall balance assessment: Needs assistance ?Sitting-balance support: No upper extremity supported, Feet unsupported ?Sitting balance-Leahy Scale: Fair ?Sitting balance - Comments: Could sit EOB statically ?  ?  ?  ?  ?  ?  ?  ?  ?  ?  ?  ?  ?  ?  ?  ?   ? ?ADL either performed or assessed with clinical judgement  ? ?ADL Overall ADL's : Needs assistance/impaired ?Eating/Feeding: Set up;Bed level ?  ?Grooming: Minimal assistance;Bed level ?  ?Upper Body Bathing: Minimal assistance;Sitting ?  ?Lower Body Bathing: Maximal assistance;Bed level ?  ?Upper Body Dressing : Set up;Sitting ?  ?  Lower Body Dressing: Sitting/lateral leans;Maximal assistance;Bed level ?  ?Toilet Transfer: +2 for safety/equipment;+2 for physical assistance ?Toilet Transfer Details  (indicate cue type and reason): unable to attempt as eval was completed in ED and patient was too short to get back up onto stretcher if standing was attempted ?Toileting- Clothing Manipulation and Hygiene: Total assistance;Bed level ?  ?  ?  ?Functional mobility during ADLs: +2 for physical assistance;+2 for safety/equipment ?   ? ? ? ?Vision Baseline Vision/History: 1 Wears glasses ?Patient Visual Report: No change from baseline ?   ?   ?Perception   ?  ?Praxis   ?  ? ?Pertinent Vitals/Pain Pain Assessment ?Pain Assessment: 0-10 ?Pain Score: 7  ?Pain Location: mid trunk down ?Pain Descriptors / Indicators: Discomfort ?Pain Intervention(s): Limited activity within patient's tolerance, Monitored during session  ? ? ? ?Hand Dominance Right ?  ?Extremity/Trunk Assessment Upper Extremity Assessment ?Upper Extremity Assessment: LUE deficits/detail;RUE deficits/detail ?RUE Deficits / Details: patient able able to ROM shoudlers to about 60 degrees flexion, and 75 degrees abduction, patient elbows and hands WFL, patients strength was 3/5 during MMT with shoulders, elbows and hands but noted to be able to use BUE to push self up in bed. ?LUE Deficits / Details: patient able able to ROM shoudlers to about 60 degrees flexion, and 75 degrees abduction,patient elbows and hands WFL. patients strength was 3/5 during MMT with shoulders, elbows and hands but noted to be able to use BUE to push self up in bed. ?  ?Lower Extremity Assessment ?Lower Extremity Assessment: Defer to PT evaluation ?RLE Deficits / Details: ROM WFL; Extremely weak; had foot drop in December; currently only minimal quad contraction, hip IR, and hip ADD all 1/5 - all other 0/5 ?RLE Sensation: decreased light touch (can only feel deep pressure) ?LLE Deficits / Details: ROM WFL; Extremely weak; had foot drop in December; currently only minimal quad contraction, hip IR, and hip ADD all 1/5 - all other 0/5 ?LLE Sensation: decreased light touch (can only feel deep  pressure) ?  ?Cervical / Trunk Assessment ?Cervical / Trunk Assessment: Kyphotic ?  ?Communication Communication ?Communication: No difficulties ?  ?Cognition Arousal/Alertness: Awake/alert ?Behavior During Therapy: Anxious ?Overall Cognitive Status: Within Functional Limits for tasks assessed ?  ?  ?  ?  ?  ?  ?  ?  ?  ?  ?  ?  ?  ?  ?  ?  ?General Comments: patient was alert and oriented x4 with ability to follow basic commands. patient was noted to be tearful at times during session ?  ?  ?General Comments  vss ? ?  ?Exercises   ?  ?Shoulder Instructions    ? ? ?Home Living Family/patient expects to be discharged to:: Skilled nursing facility ?Living Arrangements: Alone ?Available Help at Discharge: Personal care attendant;Family;Available PRN/intermittently ?Type of Home: Apartment ?Home Access: Level entry ?  ?  ?Home Layout: One level ?  ?  ?Bathroom Shower/Tub: Sponge bathes at baseline ?  ?  ?  ?  ?Home Equipment: Rollator (4 wheels);BSC/3in1 ?  ?Additional Comments: Reports has trapeze bar at home.  She was sleeping on a couch.  Reports has been fit for electric w/c but does not have yet ?  ? ?  ?Prior Functioning/Environment Prior Level of Function : Needs assist ?  ?  ?  ?  ?  ?  ?Mobility Comments: Has had difficulty with walking/transfers since February.  In December, she walked with supervision with therapy.  Reports frequent falls (maybe 7-10 last 6 months) and has to call EMS to get her up. ?ADLs Comments: Reports trouble since February with ADLs. Aide and mother assist with ADLs (lower body, pt could basically do upper body) ?  ? ?  ?  ?OT Problem List: Decreased activity tolerance;Decreased range of motion;Decreased strength;Impaired balance (sitting and/or standing);Decreased coordination;Decreased cognition;Decreased safety awareness;Decreased knowledge of use of DME or AE;Decreased knowledge of precautions;Cardiopulmonary status limiting activity;Impaired sensation;Increased edema;Pain ?  ?    ?OT Treatment/Interventions: Self-care/ADL training;Therapeutic exercise;Energy conservation;DME and/or AE instruction;Therapeutic activities;Cognitive remediation/compensation;Patient/family education;Balance training  ?

## 2021-04-26 DIAGNOSIS — R531 Weakness: Secondary | ICD-10-CM

## 2021-04-26 DIAGNOSIS — I5032 Chronic diastolic (congestive) heart failure: Secondary | ICD-10-CM | POA: Diagnosis not present

## 2021-04-26 DIAGNOSIS — Z7189 Other specified counseling: Secondary | ICD-10-CM

## 2021-04-26 DIAGNOSIS — R109 Unspecified abdominal pain: Secondary | ICD-10-CM | POA: Diagnosis not present

## 2021-04-26 DIAGNOSIS — G834 Cauda equina syndrome: Secondary | ICD-10-CM | POA: Diagnosis not present

## 2021-04-26 DIAGNOSIS — Z515 Encounter for palliative care: Secondary | ICD-10-CM

## 2021-04-26 LAB — CBC WITH DIFFERENTIAL/PLATELET
Abs Immature Granulocytes: 0.01 10*3/uL (ref 0.00–0.07)
Basophils Absolute: 0 10*3/uL (ref 0.0–0.1)
Basophils Relative: 1 %
Eosinophils Absolute: 0.2 10*3/uL (ref 0.0–0.5)
Eosinophils Relative: 3 %
HCT: 30.8 % — ABNORMAL LOW (ref 36.0–46.0)
Hemoglobin: 10.1 g/dL — ABNORMAL LOW (ref 12.0–15.0)
Immature Granulocytes: 0 %
Lymphocytes Relative: 26 %
Lymphs Abs: 1.5 10*3/uL (ref 0.7–4.0)
MCH: 29.6 pg (ref 26.0–34.0)
MCHC: 32.8 g/dL (ref 30.0–36.0)
MCV: 90.3 fL (ref 80.0–100.0)
Monocytes Absolute: 0.5 10*3/uL (ref 0.1–1.0)
Monocytes Relative: 9 %
Neutro Abs: 3.5 10*3/uL (ref 1.7–7.7)
Neutrophils Relative %: 61 %
Platelets: 288 10*3/uL (ref 150–400)
RBC: 3.41 MIL/uL — ABNORMAL LOW (ref 3.87–5.11)
RDW: 14.6 % (ref 11.5–15.5)
WBC: 5.7 10*3/uL (ref 4.0–10.5)
nRBC: 0 % (ref 0.0–0.2)

## 2021-04-26 LAB — COMPREHENSIVE METABOLIC PANEL
ALT: 23 U/L (ref 0–44)
AST: 30 U/L (ref 15–41)
Albumin: 3 g/dL — ABNORMAL LOW (ref 3.5–5.0)
Alkaline Phosphatase: 147 U/L — ABNORMAL HIGH (ref 38–126)
Anion gap: 8 (ref 5–15)
BUN: 31 mg/dL — ABNORMAL HIGH (ref 8–23)
CO2: 29 mmol/L (ref 22–32)
Calcium: 9.1 mg/dL (ref 8.9–10.3)
Chloride: 100 mmol/L (ref 98–111)
Creatinine, Ser: 1.83 mg/dL — ABNORMAL HIGH (ref 0.44–1.00)
GFR, Estimated: 31 mL/min — ABNORMAL LOW (ref >60)
Glucose, Bld: 135 mg/dL — ABNORMAL HIGH (ref 70–99)
Potassium: 3.7 mmol/L (ref 3.5–5.1)
Sodium: 137 mmol/L (ref 135–145)
Total Bilirubin: 0.6 mg/dL (ref 0.3–1.2)
Total Protein: 5.6 g/dL — ABNORMAL LOW (ref 6.5–8.1)

## 2021-04-26 LAB — GLUCOSE, CAPILLARY
Glucose-Capillary: 100 mg/dL — ABNORMAL HIGH (ref 70–99)
Glucose-Capillary: 114 mg/dL — ABNORMAL HIGH (ref 70–99)
Glucose-Capillary: 115 mg/dL — ABNORMAL HIGH (ref 70–99)
Glucose-Capillary: 92 mg/dL (ref 70–99)
Glucose-Capillary: 121 mg/dL — ABNORMAL HIGH (ref 70–99)

## 2021-04-26 LAB — BRAIN NATRIURETIC PEPTIDE: B Natriuretic Peptide: 26.4 pg/mL (ref 0.0–100.0)

## 2021-04-26 LAB — MAGNESIUM: Magnesium: 1.8 mg/dL (ref 1.7–2.4)

## 2021-04-26 MED ORDER — HEPARIN SODIUM (PORCINE) 5000 UNIT/ML IJ SOLN
5000.0000 [IU] | Freq: Three times a day (TID) | INTRAMUSCULAR | Status: DC
  Administered 2021-04-26 – 2021-05-02 (×15): 5000 [IU] via SUBCUTANEOUS
  Filled 2021-04-26 (×15): qty 1

## 2021-04-26 MED ORDER — NALOXONE HCL 0.4 MG/ML IJ SOLN: 0.4000 mg | INTRAMUSCULAR | Status: DC | PRN

## 2021-04-26 MED ORDER — GABAPENTIN 100 MG PO CAPS
200.0000 mg | ORAL_CAPSULE | Freq: Three times a day (TID) | ORAL | Status: DC
Start: 2021-04-26 — End: 2021-05-02
  Administered 2021-04-26 – 2021-05-02 (×18): 200 mg via ORAL
  Filled 2021-04-26 (×18): qty 2

## 2021-04-26 MED ORDER — AMLODIPINE BESYLATE 10 MG PO TABS
10.0000 mg | ORAL_TABLET | Freq: Every day | ORAL | Status: DC
  Administered 2021-04-26 – 2021-05-02 (×7): 10 mg via ORAL
  Filled 2021-04-26 (×8): qty 1

## 2021-04-26 MED ORDER — HYDRALAZINE HCL 20 MG/ML IJ SOLN: 10.0000 mg | Freq: Four times a day (QID) | INTRAMUSCULAR | Status: DC | PRN

## 2021-04-26 MED ORDER — LACTATED RINGERS IV SOLN: INTRAVENOUS | Status: DC

## 2021-04-26 MED ORDER — MORPHINE SULFATE ER 15 MG PO TBCR
15.0000 mg | EXTENDED_RELEASE_TABLET | Freq: Two times a day (BID) | ORAL | Status: DC
  Administered 2021-04-26 – 2021-05-02 (×12): 15 mg via ORAL
  Filled 2021-04-26 (×12): qty 1

## 2021-04-26 MED ORDER — NALOXONE HCL 0.4 MG/ML IJ SOLN: 0.2000 mg | Freq: Once | INTRAMUSCULAR | Status: DC

## 2021-04-26 MED ORDER — MORPHINE SULFATE ER 15 MG PO TBCR
30.0000 mg | EXTENDED_RELEASE_TABLET | Freq: Two times a day (BID) | ORAL | Status: DC
  Administered 2021-04-26: 30 mg via ORAL
  Filled 2021-04-26: qty 2

## 2021-04-26 NOTE — Progress Notes (Signed)
Pt refused CPAP for the evening. Pt states she will call RN if she changes her mind. RT will cont to monitor as needed. ?

## 2021-04-26 NOTE — Progress Notes (Signed)
?PROGRESS NOTE ? ? ? ?Tiffany [name]  KJZ:[id number] DOB: 1960/03/17 DOA: 04/24/2021 ?PCP: Lin Landsman, MD  ? ?Brief Narrative:  ?61 y.o. female with medical history significant of diabetes, CAD, chronic pain, COPD, CHF, substance use in remission, carotid artery disease, schizophrenia, PTSD, gout, anemia, GERD, hyperlipidemia, seizure, OSA, cardiac arrest, anoxic brain injury, CKD 3B presented with worsening abdominal pain, back pain and leg weakness.  On presentation, creatinine was 1.89. CT of the abdomen pelvis:increased endplate destruction of L3-L4 consistent with spondylodiscitis also with L3-4 anterior listhesis and severe worsening of spinal stenosis; No appreciable abscess; Stable adnexal cyst; Constipation and diverticulosis without diverticulitis noted.  MRI thoracic spine did not show any acute pathology.  MRI lumbar spine was concerning for cauda equina syndrome.    ? ?Assessment & Plan: ?  ?Low back pain, radiating to R>>L Leg - mild lower extremity weakness, MRI T and L-spine reviewed suspicion for severe spinal stenosis.  Seen by psychiatry and cleared for capacity, seen by neurosurgery.  Case discussed with neurosurgeon Dr. Christella Noa on 04/25/2021.  He does not think that patient is a good candidate for surgical intervention, also patient herself does not want surgery.  Continue medical management.  Pain medications adjusted, PT OT will be continued.  Patient is allergic to steroids hence they will not be tried for now unless absolutely needed.  She may require placement to SNF ?  ?AKI on CKD stage IIIb  - baseline creatinine around 1.6, hold Demadex hydrate gently and monitor. ?  ?Anemia of chronic disease  -Hemoglobin stable ? ?Chronic pain  - pain medications adjusted we will continue to monitor. ? ?Chronic diastolic CHF   - last echo in 12/2020 showed EF of 60 to 65% with grade 1 diastolic dysfunction. No signs of volume overload.  Strict input and output.  Daily weights.  Fluid restriction.   Continue Coreg.  Outpatient follow-up with cardiology ? ?CAD -Currently stable.  Continue Coreg and statin.  Outpatient follow-up with cardiology.  Currently not on antiplatelets ? ?Dyslipidemia.  Continue statin.   ? ?OSA -Continue home CPAP, she is noncompliant with it.  Counseled on compliance. ? ?Schizophrenia, PTSD, History of psychosis - Psychiatry evaluation pleated, she has the capacity to decide for herself, continue home medications ? ?History of anoxic brain injury, History of cardiac arrest - Palliative care consulted for goals of care discussion. ? ?Hypertension -Blood pressure intermittently elevated.  Continue amlodipine, Coreg ? ?Physical deconditioning  -We will get PT, OT evaluation.  May require placement. ? ? ? ?DVT prophylaxis: Heparin ?Code Status: Full ?Family Communication: None at bedside ?Disposition Plan: ?Status is: Inpt ? ?Consultants: Neurosurgery/psychiatry ? ?Procedures:  ? ?MRI T and L Spine - ? ?1. Study is degraded by moderate to severe motion artifact. Intraspinal detail is limited. 2. No acute findings or high-grade spinal stenosis demonstrated in the thoracic spine. There is mild multilevel spondylosis. 3. As demonstrated on recent CTs, progressive anterolisthesis at L4-5 with discal hyperintensity, endplate irregularity, edema and low level enhancement. Findings could be secondary to chronic discitis/osteomyelitis or instability. No paraspinal fluid collections are identified. There is resulting severe multifactorial spinal stenosis with significant lateral recess and foraminal narrowing bilaterally, likely contributing to cauda equina syndrome. 4. Stable mild spinal stenosis and mild lateral recess and left foraminal narrowing at L5-S1. 5. Transitional lumbosacral anatomy. ? ? ? ?Antimicrobials: None ? ? ?Subjective: ?Patient seen and examined at bedside.  Wakes up slightly, does not answer most questions.  Poor historian.  No overnight  fever, seizures or vomiting  reported. ? ?Objective: ?Vitals:  ? 04/26/21 0357 04/26/21 0400 04/26/21 0801 04/26/21 1111  ?BP: (!) 141/115  (!) 148/98 (!) 125/103  ?Pulse: 80  80 80  ?Resp: '16 16 16 14  '$ ?Temp: (!) 97.5 ?F (36.4 ?C)  98.4 ?F (36.9 ?C) 97.7 ?F (36.5 ?C)  ?TempSrc: Oral  Oral Oral  ?SpO2: 96%  98% 98%  ? ? ?Intake/Output Summary (Last 24 hours) at 04/26/2021 1117 ?Last data filed at 04/26/2021 9030 ?Gross per 24 hour  ?Intake 715 ml  ?Output 2400 ml  ?Net -1685 ml  ? ?There were no vitals filed for this visit. ? ?Examination: ? ?Awake Alert, No new F.N deficits, Normal affect ?[place].AT,PERRAL ?Supple Neck, No JVD,   ?Symmetrical Chest wall movement, Good air movement bilaterally, CTAB ?RRR,No Gallops, Rubs or new Murmurs,  ?+ve B.Sounds, Abd Soft, No tenderness,   ?No Cyanosis, Clubbing or edema  ? ? ? ?Data Reviewed: I have personally reviewed following labs and imaging studies ?Recent Labs  ?Lab 04/20/21 ?0905 04/24/21 ?0500 04/25/21 ?0923 04/26/21 ?3007  ?WBC 6.9 6.0 5.2 5.7  ?HGB 10.6* 10.3* 10.7* 10.1*  ?HCT 33.9* 34.1* 33.6* 30.8*  ?PLT 403* 320 300 288  ?MCV 93.1 94.5 92.3 90.3  ?MCH 29.1 28.5 29.4 29.6  ?MCHC 31.3 30.2 31.8 32.8  ?RDW 15.1 15.2 14.8 14.6  ?LYMPHSABS  --   --   --  1.5  ?MONOABS  --   --   --  0.5  ?EOSABS  --   --   --  0.2  ?BASOSABS  --   --   --  0.0  ? ? ?Recent Labs  ?Lab 04/20/21 ?0905 04/24/21 ?6226 04/25/21 ?3335 04/25/21 ?0645 04/26/21 ?4562  ?NA 143 137 138  --  137  ?K 3.4* 5.6* 4.3  --  3.7  ?CL 109 106 106  --  100  ?CO2 25 21* 26  --  29  ?GLUCOSE 104* 82 87  --  135*  ?BUN 20 31* 24*  --  31*  ?CREATININE 1.58* 1.89* 1.10*  --  1.83*  ?CALCIUM 9.6 9.7 9.3  --  9.1  ?AST  --  34 26  --  30  ?ALT  --  27 24  --  23  ?ALKPHOS  --  146* 142*  --  147*  ?BILITOT  --  0.3 0.4  --  0.6  ?ALBUMIN  --  4.0 3.7  --  3.0*  ?MG  --   --   --   --  1.8  ?INR  --   --   --  1.0  --   ?BNP  --   --  48.0  --  26.4  ? ? ?  ?  ?Radiology Studies: ?MR THORACIC SPINE W WO CONTRAST ? ?Result Date: 04/24/2021 ?CLINICAL  DATA:  Myelopathy, chronic, thoracic pain. Low back pain. Cauda equina syndrome suspected. EXAM: MRI THORACIC AND LUMBAR SPINE WITHOUT AND WITH CONTRAST TECHNIQUE: Multiplanar and multiecho pulse sequences of the thoracic and lumbar spine were obtained without and with intravenous contrast. CONTRAST:  28m GADAVIST GADOBUTROL 1 MMOL/ML IV SOLN COMPARISON:  Abdominopelvic CT 04/24/2021, CT cervical spine 07/22/2020, lumbar MRI 12/29/2019 and chest CTA 11/03/2016. FINDINGS: Despite efforts by the technologist and patient, moderate to severe motion artifact is present on today's exam and could not be eliminated. This reduces exam sensitivity and specificity. Motion is greatest on the axial images. MRI THORACIC SPINE FINDINGS Alignment:  Physiologic. Vertebrae:  No acute or suspicious osseous findings. Serpiginous lesion in the left aspect of the T11 vertebral body with low T1 and low T2 signal shows nonspecific enhancement following contrast. This lesion is unchanged from the 2018 CT, consistent with a benign finding. Cord: Cord evaluation limited by motion. No abnormal cord signal or enhancement identified. Paraspinal and other soft tissues: No significant paraspinal findings. Disc levels: Multilevel spondylosis with disc bulging and posterior osteophytes. Details at the individual levels are limited by motion artifact, although no large disc herniation, cord deformity or high-grade foraminal narrowing is seen. MRI LUMBAR SPINE FINDINGS Segmentation: Transitional lumbosacral anatomy. There is a transitional, nearly fully lumbarized S1 segment. Concordant with previous imaging. Alignment: As seen on recent CT, there is a progressive anterolisthesis at L4-5 compared with prior abdominal CTs and MRI, now measuring approximately 5 mm. A mild degenerative anterolisthesis at L5-S1 is unchanged. Vertebrae: As seen on CT, there is progressive endplate irregularity and collapse at L4-5. There is associated mild endplate T2  hyperintensity and enhancement. There is hyperintensity within the L4-5 disc. There is also fluid and enhancement within the facet joints bilaterally. No other significant osseous abnormalities. The visualized sacroiliac

## 2021-04-26 NOTE — Progress Notes (Signed)
[name] attempted to meet with patient to discuss recommendation for SNF, and patient would not wake up after repeated attempts to call her name and press on her shoulder. [name] to attempt to complete assessment with patient when she is arousable. ? ?Laveda Abbe, LCSW ?Clinical Social Worker ?[phone] ? ?

## 2021-04-26 NOTE — Consult Note (Signed)
? ?Palliative Care Consult Note  ?                                ?Date: 04/26/2021  ? ?Patient Name: Tiffany [name]  ?DOB: 1960-11-04  MRN: [id number]  Age / Sex: 61 y.o., female  ?PCP: Tiffany Landsman, MD ?Referring Physician: Thurnell Lose, MD ? ?Reason for Consultation: Establishing goals of care ? ?HPI/Patient Profile: 61 y.o. female  with past medical history of diabetes, CKD stage IIIb, coronary artery disease, COPD, CHF, carotid artery disease, schizophrenia, substance abuse in remission since 2018, PTSD, gout, anemia, GERD, hyperlipidemia, seizure, OSA, and history of cardiac arrest and anoxic brain injury in 2018.  Tiffany [name] presented to the emergency department on 04/24/2021 with worsening abdominal pain and back pain.  Tiffany [name] also reported progressive weakness of Tiffany [name] bilateral lower extremities since February.  MRI spine showed severe multifactorial spinal stenosis with significant lateral recess and foraminal narrowing bilaterally, likely contributing to cauda equina syndrome.  Tiffany [name] was admitted to the hospitalist service with cauda equina syndrome. ? ?Tiffany [name] was seen by neurosurgery, who felt that anatomically Tiffany [name] would do well with a decompression but was unsure if Tiffany [name] would be a good surgical candidate due to being lethargic and not compliant with ER staff.  Tiffany [name] was then seen by psychiatry, who determined Tiffany [name] was cooperative and has capacity to make Tiffany [name] own medical decisions. ? ? ?Subjective:  ? ?Tiffany [name] have reviewed medical records including EPIC notes, labs and imaging, and went to see the patient at bedside to discuss diagnosis, prognosis, GOC, EOL wishes, disposition, and options.  Tiffany [name] is out of bed to the recliner but initially very sleepy and difficult to arouse.  Her RN is in the room attempting to give medication, and reports that patient received MS Contin earlier today.  On further attempts to awaken patient, Tiffany [name] is able to stay awake and engage in  conversation. ? ?Tiffany [name] introduced Palliative Medicine as specialized medical care for people living with serious illness. It focuses on providing relief from the symptoms and stress of a serious illness.  ? ?Created space and opportunity for patient to explore thoughts and feelings regarding current medical situation. ? ?Values and goals of care important to patient and family were attempted to be elicited. ? ? ?Life Review: ?Tiffany [name] tells me Tiffany [name] has been disabled for quite some time.  Prior to that, Tiffany [name] worked on [employer] but would often "cry at work" due to pain.  Tiffany [name] is widowed -Tiffany [name] husband died at age 88 from a heart attack.  Tiffany [name] has 2 living children -daughter Tiffany [name] who lives in [place] and son Tiffany [name] who lives in the [place].  When Tiffany [name] was age 24, Tiffany [name] also had another daughter who died during infancy; Tiffany [name] tells me this was a devastating event and that Tiffany [name] had to "go on medication" at this time. ? ?Functional Status: ?Tiffany [name] lives alone in Tiffany [name] home.  Tiffany [name] has an 10 year old mother who sometimes brings Tiffany [name] food.  Tiffany [name] also has a home health aide that can help with care.  Tiffany [name] does not drive.  Tiffany [name] tells me Tiffany [name] has not walked in proximately 6 weeks.  Tiffany [name] was able to stand and pivot to Tiffany [name] bedside commode. ? ?Patient/Family Understanding of Illness: ?We discussed patient's current illness and what it means in the larger context of his/Tiffany [name] ongoing co-morbidities. Current clinical status was reviewed.  Tiffany [name] understands that Tiffany [name]  has severe spinal stenosis.  Tiffany [name] is unsure if Tiffany [name] would be willing to undergo surgical intervention if offered/recommended.  Encouraged Tiffany [name] to keep an open mind when discussing options with neurosurgery. ? ?Tiffany [name] is also able to tell me that Tiffany [name] has heart failure which Tiffany [name] describes as Tiffany [name] heart "does not pump correctly". Natural disease trajectory of chronic illness was discussed. ? ?Patient Values: ?Tiffany [name] family is what Tiffany [name] values most.  Tiffany [name] tells me about the birth of Tiffany [name]  granddaughter in April 2022 and how this gave renewed meaning to Tiffany [name] life. ? ?Goals: ?"To stay around as long as possible".  Tiffany [name] also wishes to remain in Tiffany [name] home if possible. ? ?Additional Discussion: ?[name] tells me that despite Tiffany [name] complex medical and psychiatric issues, Tiffany [name] has always been "able to come back".  We discussed Tiffany [name] significant weakness and that current recommendation from PT/OT is for rehab with the goal of improving functional status.  Tiffany [name] tells me Tiffany [name] has had prior bad experiences with rehab facilities that were a 1 or 2 star (on Medicare ratings).  Tiffany [name] tells me Tiffany [name] would prefer to have PT at Tiffany [name] home if possible. ? ?Tiffany [name] attempted to discuss advanced directives.  Tiffany [name] asked Tiffany [name] who Tiffany [name] would want to make healthcare decisions for Tiffany [name] if Tiffany [name] were unable to make them for herself.  Tiffany [name] tells me that Tiffany [name] daughter is Tiffany [name] emergency contact and would be Tiffany [name] decision maker if needed.  Tiffany [name] offered to assist Tiffany [name] in creating an HCPOA document while in the hospital, however Tiffany [name] declines. ? ?The difference between aggressive medical intervention and comfort care was considered. The patient is interested in all aggressive medical interventions offered. We did discuss code status. Encouraged patient to consider DNR/DNI status understanding evidenced based poor outcomes in similar hospitalized patients, as the cause of the arrest is likely associated with chronic/terminal disease rather than a reversible acute cardio-pulmonary event.  Tiffany [name] refers to Tiffany [name] cardiac arrest and mechanical ventilation in 2018 -that the resuscitation efforts Tiffany [name] underwent then have given Tiffany [name] 5 more years of life. We discussed that Tiffany [name] was very lucky to have survived this illness.  At this time, Tiffany [name] wishes to remain full code with the understanding that Tiffany [name] would receive CPR, defibrillation, and intubation in the event of cardiac arrest.  Tiffany [name] asked if Tiffany [name] would be willing to undergo prolonged mechanical intubation, and Tiffany [name] states Tiffany [name] is  unsure. ? ?Discussed the importance of continued conversation with patient and the medical team regarding overall plan of care and treatment options, ensuring decisions are within the context of the patients values and GOCs. ? ? ?Review of Systems  ?Musculoskeletal:  Positive for back pain.  ?Neurological:  Positive for weakness.  ? ?Objective:  ? ?Primary Diagnoses: ?Present on Admission: ?? Chronic pain syndrome ?? COPD (chronic obstructive pulmonary disease) ([place]) ?? Chronic diastolic congestive heart failure ([place]) ?? History of drug abuse in remission [facility]) ?? Essential hypertension ?? Carotid artery stenosis ?? Schizophrenia, schizo-affective ([place]) ?? Dyslipidemia ?? Anemia ?? OSA and COPD overlap syndrome ([place]) ?? Anoxic brain injury ([place]) ?? Stage 3b chronic kidney disease (CKD) ([place]) ?? Gout ?? PTSD (post-traumatic stress disorder) ?? GERD (gastroesophageal reflux disease) ?? Cauda equina compression ([facility]) ?? Schizoaffective disorder, bipolar type ([place]) ? ? ?Physical Exam ?Vitals reviewed.  ?Constitutional:   ?   General: Tiffany [name] is not in acute distress. ?   Appearance: Tiffany [name] is ill-appearing.  ?Pulmonary:  ?   Effort: Pulmonary effort is normal.  ?Neurological:  ?  Mental Status: Tiffany [name] is alert and oriented to person, place, and time.  ?   Motor: Weakness present.  ?Psychiatric:     ?   Behavior: Behavior normal.  ? ? ?Vital Signs:  ?BP (!) 125/103 (BP Location: Left Arm)   Pulse 80   Temp 97.7 ?F (36.5 ?C) (Oral)   Resp 14   SpO2 98%  ? ?Palliative Assessment/Data: PPS 30-40% ? ? ? ? ?Assessment & Plan:  ? ?SUMMARY OF RECOMMENDATIONS   ?Full code full scope ?Continue all current interventions ?It is unsure if Tiffany [name] would want surgical intervention but is willing to keep an open mind when reviewing options with neurosurgery ?PMT will continue to follow ? ?Primary Decision Maker: ?PATIENT ?Tiffany [name] states Tiffany [name] daughter Tiffany [name] would be Tiffany [name] health care agent if needed ? ?Prognosis:  ?Unable to determine ? ?Discharge  Planning:  ?To Be Determined  ? ? ?Thank you for allowing [name] to participate in the care of Tiffany [name] ? ?MDM - [place] ? ? ?Signed by: ?Elie Confer, NP ?Palliative Medicine Team ? ?Team Phone # [phone]  ?For individual provide

## 2021-04-27 ENCOUNTER — Other Ambulatory Visit: Payer: Self-pay | Admitting: Physical Medicine and Rehabilitation

## 2021-04-27 ENCOUNTER — Other Ambulatory Visit: Payer: Self-pay

## 2021-04-27 DIAGNOSIS — G894 Chronic pain syndrome: Secondary | ICD-10-CM | POA: Diagnosis not present

## 2021-04-27 DIAGNOSIS — I5032 Chronic diastolic (congestive) heart failure: Secondary | ICD-10-CM | POA: Diagnosis not present

## 2021-04-27 DIAGNOSIS — N179 Acute kidney failure, unspecified: Secondary | ICD-10-CM | POA: Diagnosis not present

## 2021-04-27 DIAGNOSIS — R531 Weakness: Secondary | ICD-10-CM | POA: Diagnosis not present

## 2021-04-27 DIAGNOSIS — G834 Cauda equina syndrome: Secondary | ICD-10-CM | POA: Diagnosis not present

## 2021-04-27 DIAGNOSIS — R109 Unspecified abdominal pain: Secondary | ICD-10-CM | POA: Diagnosis not present

## 2021-04-27 LAB — COMPREHENSIVE METABOLIC PANEL
ALT: 20 U/L (ref 0–44)
AST: 24 U/L (ref 15–41)
Albumin: 3 g/dL — ABNORMAL LOW (ref 3.5–5.0)
Alkaline Phosphatase: 125 U/L (ref 38–126)
Anion gap: 7 (ref 5–15)
BUN: 31 mg/dL — ABNORMAL HIGH (ref 8–23)
CO2: 29 mmol/L (ref 22–32)
Calcium: 9 mg/dL (ref 8.9–10.3)
Chloride: 102 mmol/L (ref 98–111)
Creatinine, Ser: 1.77 mg/dL — ABNORMAL HIGH (ref 0.44–1.00)
GFR, Estimated: 32 mL/min — ABNORMAL LOW (ref >60)
Glucose, Bld: 117 mg/dL — ABNORMAL HIGH (ref 70–99)
Potassium: 3.6 mmol/L (ref 3.5–5.1)
Sodium: 138 mmol/L (ref 135–145)
Total Bilirubin: 0.2 mg/dL — ABNORMAL LOW (ref 0.3–1.2)
Total Protein: 5.6 g/dL — ABNORMAL LOW (ref 6.5–8.1)

## 2021-04-27 LAB — GLUCOSE, CAPILLARY
Glucose-Capillary: 98 mg/dL (ref 70–99)
Glucose-Capillary: 111 mg/dL — ABNORMAL HIGH (ref 70–99)
Glucose-Capillary: 99 mg/dL (ref 70–99)
Glucose-Capillary: 104 mg/dL — ABNORMAL HIGH (ref 70–99)

## 2021-04-27 LAB — CBC WITH DIFFERENTIAL/PLATELET
Abs Immature Granulocytes: 0.02 10*3/uL (ref 0.00–0.07)
Basophils Absolute: 0 10*3/uL (ref 0.0–0.1)
Basophils Relative: 0 %
Eosinophils Absolute: 0.2 10*3/uL (ref 0.0–0.5)
Eosinophils Relative: 3 %
HCT: 31.5 % — ABNORMAL LOW (ref 36.0–46.0)
Hemoglobin: 10 g/dL — ABNORMAL LOW (ref 12.0–15.0)
Immature Granulocytes: 0 %
Lymphocytes Relative: 29 %
Lymphs Abs: 1.6 10*3/uL (ref 0.7–4.0)
MCH: 29.4 pg (ref 26.0–34.0)
MCHC: 31.7 g/dL (ref 30.0–36.0)
MCV: 92.6 fL (ref 80.0–100.0)
Monocytes Absolute: 0.6 10*3/uL (ref 0.1–1.0)
Monocytes Relative: 10 %
Neutro Abs: 3.2 10*3/uL (ref 1.7–7.7)
Neutrophils Relative %: 58 %
Platelets: 275 10*3/uL (ref 150–400)
RBC: 3.4 MIL/uL — ABNORMAL LOW (ref 3.87–5.11)
RDW: 14.9 % (ref 11.5–15.5)
WBC: 5.6 10*3/uL (ref 4.0–10.5)
nRBC: 0 % (ref 0.0–0.2)

## 2021-04-27 LAB — BRAIN NATRIURETIC PEPTIDE: B Natriuretic Peptide: 11.9 pg/mL (ref 0.0–100.0)

## 2021-04-27 LAB — MAGNESIUM: Magnesium: 1.7 mg/dL (ref 1.7–2.4)

## 2021-04-27 MED ORDER — LACTATED RINGERS IV SOLN
INTRAVENOUS | Status: AC
Start: 2021-04-27 — End: 2021-04-27

## 2021-04-27 NOTE — Progress Notes (Signed)
Pt placed on CPAP for bed. RT will cont to monitor as needed.  

## 2021-04-27 NOTE — [place] FL2 (Signed)
?[place] MEDICAID FL2 LEVEL OF CARE SCREENING TOOL  ?  ? ?IDENTIFICATION  ?Patient Name: ?Tiffany [name] Birthdate: February 04, 1960 Sex: female Admission Date (Current Location): ?04/24/2021  ?[place] and [place] Number: ? [place] ?  Facility and Address:  ?[facility], [place] [address] [place],  [address] ?     Provider Number: ?[id number]  ?Attending Physician Name and Address:  ?Thurnell Lose, MD ? Relative Name and Phone Number:  ?  ?   ?Current Level of Care: ?Hospital Recommended Level of Care: ?[place] Prior Approval Number: ?  ? ?Date Approved/Denied: ?  PASRR Number: ?[id number] B ? ?Discharge Plan: ?SNF ?  ? ?Current Diagnoses: ?Patient Active Problem List  ? Diagnosis Date Noted  ? Cauda equina compression ([place]) 04/24/2021  ? Hematochezia   ? Hyperlipidemia 03/31/2021  ? GI bleed 03/30/2021  ? Acute GI bleeding 03/29/2021  ? Atypical chest pain 01/13/2021  ? GERD (gastroesophageal reflux disease)   ? Neuropathy 11/18/2020  ? Pain in both feet 11/18/2020  ? Localized edema 11/18/2020  ? Encounter for medication management 11/17/2020  ? Protein-calorie malnutrition, severe 05/15/2020  ? Malnutrition of moderate degree 05/13/2020  ? Periumbilical abdominal pain   ? Dysphagia   ? Nausea vomiting and diarrhea   ? Generalized abdominal pain   ? E. coli UTI 02/20/2020  ? Acute metabolic encephalopathy 35/32/9924  ? Psychosis ([place]) 02/20/2020  ? Schizophrenia, schizo-affective ([place]) 02/20/2020  ? Metabolic acidosis, normal anion gap (NAG) 02/20/2020  ? Altered mental status   ? Hypocalcemia   ? AKI (acute kidney injury) ([place]) 02/18/2020  ? Pelvic mass in female 01/02/2020  ? Genital herpes 11/25/2019  ? Stage 2 skin ulcer of sacral region ([place]) 10/28/2019  ? Acute renal failure superimposed on stage 3b chronic kidney disease ([place]) 10/27/2019  ? Family discord 02/04/2019  ? Aggressive behavior   ? PTSD (post-traumatic stress disorder) 05/27/2018  ? Schizoaffective  disorder, bipolar type ([place]) 04/05/2018  ? Closed displaced fracture of fifth metacarpal bone 03/21/2018  ? Difficulty with speech 01/24/2018  ? Encephalopathy 11/21/2017  ? Drug abuse ([place]) 11/21/2017  ? Frequent falls 10/11/2017  ? Binge eating disorder   ? Dependence on continuous supplemental oxygen 05/14/2017  ? Gout 04/11/2017  ? Hypomagnesemia   ? Stage 3b chronic kidney disease (CKD) ([place]) 12/15/2016  ? Carotid artery stenosis   ? Hypokalemia   ? Osteoarthritis 10/26/2016  ? Anoxic brain injury ([place]) 09/08/2016  ? Overactive bladder 06/07/2016  ? QT prolongation   ? OSA and COPD overlap syndrome ([place])   ? Arthritis   ? Essential hypertension 03/22/2016  ? Cocaine use disorder, severe, in sustained remission ([place]) 12/17/2015  ? History of drug abuse in remission ([place]) 11/28/2015  ? Hyponatremia 11/25/2015  ? Chronic diastolic congestive heart failure ([place])   ? Chronic respiratory failure with hypoxia ([place]) 06/22/2015  ? Tobacco use disorder 07/22/2014  ? COPD (chronic obstructive pulmonary disease) ([place]) 07/08/2014  ? Seizure ([place]) 01/04/2013  ? Chronic pain syndrome 06/18/2012  ? Dyslipidemia 04/24/2011  ? Anemia 04/24/2011  ? Diabetic neuropathy ([place]) 04/24/2011  ? Morbid obesity ([place]) 10/18/2010  ? Type 2 diabetes mellitus ([place]) 10/18/2010  ? ? ?Orientation RESPIRATION BLADDER Height & Weight   ?  ?Self, Time, Situation, Place ? Normal Incontinent Weight:   ?Height:     ?BEHAVIORAL SYMPTOMS/MOOD NEUROLOGICAL BOWEL NUTRITION STATUS  ?    Incontinent Diet (Regular)  ?AMBULATORY STATUS COMMUNICATION OF NEEDS Skin   ?  Extensive Assist Verbally Normal ?  ?  ?  ?    ?     ?     ? ? ?Personal Care Assistance Level of Assistance  ?Bathing, Feeding, Dressing Bathing Assistance: Maximum assistance ?Feeding assistance: Limited assistance ?Dressing Assistance: Maximum assistance ?   ? ?Functional Limitations Info  ?Sight Sight Info: Impaired ?  ?   ? ? ?SPECIAL CARE FACTORS FREQUENCY  ?PT (By licensed PT), OT (By  licensed OT)   ?  ?PT Frequency: 5x/wk ?OT Frequency: 5x/wk ?  ?  ?  ?   ? ? ?Contractures Contractures Info: Not present  ? ? ?Additional Factors Info  ?Code Status, Allergies, Psychotropic, Insulin Sliding Scale Code Status Info: Full ?Allergies Info: Hydrocodone, Latuda (Lurasidone Hcl), Magnesium-containing Compounds, Prednisone, Tramadol, Codeine, Trazodone, Topamax (Topiramate), Sulfa Antibiotics, Tape ?Psychotropic Info: Cymbalta '30mg'$  2x/day; Lexapro '20mg'$  daily in the morning ?Insulin Sliding Scale Info: see DC Summary ?  ?   ? ?Current Medications (04/27/2021):  This is the current hospital active medication list ?Current Facility-Administered Medications  ?Medication Dose Route Frequency Provider Last Rate Last Admin  ? acetaminophen (TYLENOL) tablet 650 mg  650 mg Oral Q6H PRN Marcelyn Bruins, MD      ? Or  ? acetaminophen (TYLENOL) suppository 650 mg  650 mg Rectal Q6H PRN Marcelyn Bruins, MD      ? albuterol (PROVENTIL) (2.5 MG/3ML) 0.083% nebulizer solution 2.5 mg  2.5 mg Nebulization Q6H PRN Marcelyn Bruins, MD      ? amLODipine (NORVASC) tablet 10 mg  10 mg Oral Daily Thurnell Lose, MD   10 mg at 04/27/21 4944  ? asenapine (SAPHRIS) sublingual tablet 10 mg  10 mg Sublingual BID Marcelyn Bruins, MD   10 mg at 04/27/21 9675  ? atorvastatin (LIPITOR) tablet 20 mg  20 mg Oral Daily Marcelyn Bruins, MD   20 mg at 04/27/21 9163  ? carvedilol (COREG) tablet 6.25 mg  6.25 mg Oral BID WC Marcelyn Bruins, MD   6.25 mg at 04/27/21 8466  ? dicyclomine (BENTYL) capsule 10 mg  10 mg Oral TID AC Thurnell Lose, MD   10 mg at 04/27/21 5993  ? DULoxetine (CYMBALTA) DR capsule 30 mg  30 mg Oral BID Patrecia Pour, NP   30 mg at 04/27/21 5701  ? escitalopram (LEXAPRO) tablet 20 mg  20 mg Oral q morning Thurnell Lose, MD   20 mg at 04/27/21 7793  ? fluticasone furoate-vilanterol (BREO ELLIPTA) 100-25 MCG/ACT 1 puff  1 puff Inhalation Daily Marcelyn Bruins, MD   1 puff at 04/27/21  0750  ? And  ? umeclidinium bromide (INCRUSE ELLIPTA) 62.5 MCG/ACT 1 puff  1 puff Inhalation Daily Marcelyn Bruins, MD   1 puff at 04/27/21 0750  ? gabapentin (NEURONTIN) capsule 200 mg  200 mg Oral TID Thurnell Lose, MD   200 mg at 04/27/21 9030  ? heparin injection 5,000 Units  5,000 Units Subcutaneous Q8H Thurnell Lose, MD   5,000 Units at 04/27/21 0923  ? hydrALAZINE (APRESOLINE) injection 10 mg  10 mg Intravenous Q6H PRN Thurnell Lose, MD      ? insulin aspart (novoLOG) injection 0-9 Units  0-9 Units Subcutaneous TID WC Marcelyn Bruins, MD      ? lactated ringers infusion   Intravenous Continuous Thurnell Lose, MD 75 mL/hr at 04/27/21 0946 Restarted at 04/27/21 0946  ? montelukast (SINGULAIR) tablet 10 mg  10 mg Oral QHS Marcelyn Bruins, MD   10 mg at 04/26/21 2211  ? morphine (MS CONTIN) 12 hr tablet 15 mg  15 mg Oral Q12H Thurnell Lose, MD   15 mg at 04/27/21 3159  ? naloxone [facility]) injection 0.2 mg  0.2 mg Intravenous Once Thurnell Lose, MD      ? naloxone [facility]) injection 0.4 mg  0.4 mg Intravenous PRN Thurnell Lose, MD      ? oxyCODONE-acetaminophen (PERCOCET/ROXICET) 5-325 MG per tablet 1 tablet  1 tablet Oral Q6H PRN Thurnell Lose, MD   1 tablet at 04/26/21 0303  ? pantoprazole (PROTONIX) EC tablet 40 mg  40 mg Oral Daily Marcelyn Bruins, MD   40 mg at 04/27/21 0825  ? polyethylene glycol (MIRALAX / GLYCOLAX) packet 17 g  17 g Oral Daily PRN Marcelyn Bruins, MD      ? prazosin (MINIPRESS) capsule 2 mg  2 mg Oral QHS Marcelyn Bruins, MD   2 mg at 04/26/21 2212  ? sodium chloride flush (NS) 0.9 % injection 3 mL  3 mL Intravenous Q12H Marcelyn Bruins, MD   3 mL at 04/26/21 2231  ? ? ? ?Discharge Medications: ?Please see discharge summary for a list of discharge medications. ? ?Relevant Imaging Results: ? ?Relevant Lab Results: ? ? ?Additional Information ?SS#: [id number] ? ?Geralynn Ochs, LCSW ? ? ? ? ?

## 2021-04-27 NOTE — Progress Notes (Signed)
?PROGRESS NOTE ? ? ? ?ANABETH CHILCOTT  ZOX:[id number] DOB: 1960/09/02 DOA: 04/24/2021 ?PCP: Lin Landsman, MD  ? ?Brief Narrative:  ?61 y.o. female with medical history significant of diabetes, CAD, chronic pain, COPD, CHF, substance use in remission, carotid artery disease, schizophrenia, PTSD, gout, anemia, GERD, hyperlipidemia, seizure, OSA, cardiac arrest, anoxic brain injury, CKD 3B presented with worsening abdominal pain, back pain and leg weakness.  On presentation, creatinine was 1.89. CT of the abdomen pelvis:increased endplate destruction of L3-L4 consistent with spondylodiscitis also with L3-4 anterior listhesis and severe worsening of spinal stenosis; No appreciable abscess; Stable adnexal cyst; Constipation and diverticulosis without diverticulitis noted.  MRI thoracic spine did not show any acute pathology.  MRI lumbar spine was concerning for cauda equina syndrome.    ? ? ? ?Subjective:  Patient in bed sleeping, woken up, no headache, improved back pain, no chest or abdominal pain or shortness of breath.  Chronic bilateral lower extremity weakness with no change ? ?Assessment & Plan: ?  ?Low back pain, radiating to R>>L Leg with chronic bilateral lower extremity weakness 3/5 - mild possible worsening lower extremity weakness very weak to begin with, MRI T and L-spine reviewed suspicion for severe spinal stenosis.  Seen by psychiatry and cleared for capacity, seen by neurosurgery.  Case discussed with neurosurgeon Dr. Christella Noa on 04/25/2021.  He does not think that patient is a good candidate for surgical intervention, also patient herself does not want surgery.  Continue medical management.  Pain medications adjusted, PT OT will be continued.  Patient is allergic to steroids hence they will not be tried for now unless absolutely needed.  She may require placement to SNF, with supportive care pain has improved.  Likely discharge in the next 1 to 2 days ?  ?AKI on CKD stage IIIb  - baseline creatinine around  1.6, hold Demadex, gentle IV fluids for 12 more hours. ?  ?Anemia of chronic disease  -Hemoglobin stable ? ?Chronic pain  - pain medications adjusted we will continue to monitor. ? ?Chronic diastolic CHF   - last echo in 12/2020 showed EF of 60 to 65% with grade 1 diastolic dysfunction. No signs of volume overload.  Strict input and output.  Daily weights.  Fluid restriction.  Continue Coreg.  Outpatient follow-up with cardiology ? ?CAD -Currently stable.  Continue Coreg and statin.  Outpatient follow-up with cardiology.  Currently not on antiplatelets ? ?Dyslipidemia.  Continue statin.   ? ?OSA -Continue home CPAP, she is noncompliant with it.  Counseled on compliance. ? ?Schizophrenia, PTSD, History of psychosis - Psychiatry evaluation pleated, she has the capacity to decide for herself, continue home medications ? ?History of anoxic brain injury, History of cardiac arrest - Palliative care consulted for goals of care discussion. ? ?Hypertension -Blood pressure intermittently elevated.  Continue amlodipine, Coreg ? ?Physical deconditioning  -We will get PT, OT evaluation.  May require placement. ? ? ? ?DVT prophylaxis: Heparin ?Code Status: Full ?Family Communication: None at bedside ?Disposition Plan: ?Status is: Inpt ? ?Consultants: Neurosurgery/psychiatry ? ?Procedures:  ? ?MRI T and L Spine - ? ?1. Study is degraded by moderate to severe motion artifact. Intraspinal detail is limited. 2. No acute findings or high-grade spinal stenosis demonstrated in the thoracic spine. There is mild multilevel spondylosis. 3. As demonstrated on recent CTs, progressive anterolisthesis at L4-5 with discal hyperintensity, endplate irregularity, edema and low level enhancement. Findings could be secondary to chronic discitis/osteomyelitis or instability. No paraspinal fluid collections are identified. There is  resulting severe multifactorial spinal stenosis with significant lateral recess and foraminal narrowing bilaterally,  likely contributing to cauda equina syndrome. 4. Stable mild spinal stenosis and mild lateral recess and left foraminal narrowing at L5-S1. 5. Transitional lumbosacral anatomy. ? ? ? ?Antimicrobials: None ? ? ?Objective: ?Vitals:  ? 04/27/21 0018 04/27/21 0434 04/27/21 0737 04/27/21 0759  ?BP:  122/68 (!) 107/96   ?Pulse: 80 81 80 80  ?Resp: '16 15 16 16  '$ ?Temp:  98.4 ?F (36.9 ?C) 97.6 ?F (36.4 ?C)   ?TempSrc:  Oral Oral   ?SpO2: 98% 93% 96%   ? ? ?Intake/Output Summary (Last 24 hours) at 04/27/2021 4742 ?Last data filed at 04/27/2021 5956 ?Gross per 24 hour  ?Intake 952 ml  ?Output 1100 ml  ?Net -148 ml  ? ?There were no vitals filed for this visit. ? ?Examination: ? ?Awake Alert, No new F.N deficits, bilateral lower extremity strength at baseline appears to be 3/5 - left more weaker than right ?[place].AT,PERRAL ?Supple Neck, No JVD,   ?Symmetrical Chest wall movement, Good air movement bilaterally, CTAB ?RRR,No Gallops, Rubs or new Murmurs,  ?+ve B.Sounds, Abd Soft, No tenderness,   ?No Cyanosis, Clubbing or edema  ? ? ? ?Data Reviewed: I have personally reviewed following labs and imaging studies ?Recent Labs  ?Lab 04/20/21 ?0905 04/24/21 ?0500 04/25/21 ?3875 04/26/21 ?6433 04/27/21 ?0340  ?WBC 6.9 6.0 5.2 5.7 5.6  ?HGB 10.6* 10.3* 10.7* 10.1* 10.0*  ?HCT 33.9* 34.1* 33.6* 30.8* 31.5*  ?PLT 403* 320 300 288 275  ?MCV 93.1 94.5 92.3 90.3 92.6  ?MCH 29.1 28.5 29.4 29.6 29.4  ?MCHC 31.3 30.2 31.8 32.8 31.7  ?RDW 15.1 15.2 14.8 14.6 14.9  ?LYMPHSABS  --   --   --  1.5 1.6  ?MONOABS  --   --   --  0.5 0.6  ?EOSABS  --   --   --  0.2 0.2  ?BASOSABS  --   --   --  0.0 0.0  ? ? ?Recent Labs  ?Lab 04/20/21 ?0905 04/24/21 ?2951 04/25/21 ?8841 04/25/21 ?0645 04/26/21 ?6606 04/27/21 ?0340  ?NA 143 137 138  --  137 138  ?K 3.4* 5.6* 4.3  --  3.7 3.6  ?CL 109 106 106  --  100 102  ?CO2 25 21* 26  --  29 29  ?GLUCOSE 104* 82 87  --  135* 117*  ?BUN 20 31* 24*  --  31* 31*  ?CREATININE 1.58* 1.89* 1.10*  --  1.83* 1.77*  ?CALCIUM 9.6  9.7 9.3  --  9.1 9.0  ?AST  --  34 26  --  30 24  ?ALT  --  27 24  --  23 20  ?ALKPHOS  --  146* 142*  --  147* 125  ?BILITOT  --  0.3 0.4  --  0.6 0.2*  ?ALBUMIN  --  4.0 3.7  --  3.0* 3.0*  ?MG  --   --   --   --  1.8 1.7  ?INR  --   --   --  1.0  --   --   ?BNP  --   --  48.0  --  26.4 11.9  ? ? ?  ?  ?Radiology Studies: ?No results found. ? ?Scheduled Meds: ? amLODipine  10 mg Oral Daily  ? asenapine  10 mg Sublingual BID  ? atorvastatin  20 mg Oral Daily  ? carvedilol  6.25 mg Oral BID WC  ? dicyclomine  10 mg Oral TID AC  ? DULoxetine  30 mg Oral BID  ? escitalopram  20 mg Oral q morning  ? fluticasone furoate-vilanterol  1 puff Inhalation Daily  ? And  ? umeclidinium bromide  1 puff Inhalation Daily  ? gabapentin  200 mg Oral TID  ? heparin injection (subcutaneous)  5,000 Units Subcutaneous Q8H  ? insulin aspart  0-9 Units Subcutaneous TID WC  ? montelukast  10 mg Oral QHS  ? morphine  15 mg Oral Q12H  ? naLOXone (NARCAN)  injection  0.2 mg Intravenous Once  ? pantoprazole  40 mg Oral Daily  ? prazosin  2 mg Oral QHS  ? sodium chloride flush  3 mL Intravenous Q12H  ? ?Continuous Infusions: ? lactated ringers 75 mL/hr at 04/27/21 0131  ? ? ?Signature ? ?Lala Lund M.D on 04/27/2021 at 8:22 AM   -  To page go to [email or web address]  ? ?  ? ?

## 2021-04-27 NOTE — Progress Notes (Signed)
?                                                                                                                                                     ?                                                   ?Palliative Medicine Progress Note  ? ?Patient Name: Tiffany [name]       Date: 04/27/2021 ?DOB: 1960-02-18  Age: 61 y.o. MRN#: [id number] ?Attending Physician: Thurnell Lose, MD ?Primary Care Physician: Lin Landsman, MD ?Admit Date: 04/24/2021 ? ? ? ?HPI/Patient Profile: ? 61 y.o. female  with past medical history of diabetes, CKD stage IIIb, coronary artery disease, COPD, CHF, carotid artery disease, schizophrenia, substance abuse in remission since 2018, PTSD, gout, anemia, GERD, hyperlipidemia, seizure, OSA, and history of cardiac arrest and anoxic brain injury in 2018.  She presented to the emergency department on 04/24/2021 with worsening abdominal pain and back pain.  She also reported progressive weakness of her bilateral lower extremities since February.  MRI spine showed severe multifactorial spinal stenosis with significant lateral recess and foraminal narrowing bilaterally, likely contributing to cauda equina syndrome.  She was admitted to the hospitalist service with cauda equina syndrome. ?  ?She was seen by neurosurgery, who felt that anatomically she would do well with a decompression but was unsure if she would be a good surgical candidate due to being lethargic and not compliant with ER staff.  She was then seen by psychiatry, who determined she was cooperative and has capacity to make her own medical decisions. ? ?Subjective: ?I assessed patient at bedside.  [name] alert and pleasant.  She tells me that her pain is "much better".  She has no other acute complaints at this time. ? ?We reviewed plan of care.  She confirms that she is not interested in surgical intervention.  Her goal is to improve functional status; she is hopeful to be able to walk again.  We reviewed that current recommendation from  PT/OT is for rehab with the goal of improving functional status.  [name] only willing to go to rehab if it is a facility where she feels she will receive quality care.  She again states that she will not go to a facility with a low (1 or 2 star) Medicare rating. ? ?I again attempted to discuss advanced directives, specifically HCPOA.  I provided education that a healthcare agent is a person that can make healthcare decisions for you when you are not able to make those decisions for yourself.  She expresses concern about giving another person "power" over her affairs.  I explained  that a healthcare agent only comes in to affect when the medical team determines that someone has lost the ability to make their own healthcare decisions. [place] declines to name or designate a healthcare agent at this time.  ? ? ?Objective: ? ?Physical Exam ?Vitals reviewed.  ?Constitutional:   ?   General: [name] not in acute distress. ?   Comments: Chronically ill-appearing  ?Pulmonary:  ?   Effort: Pulmonary effort is normal.  ?Neurological:  ?   Mental Status: [name] alert and oriented to person, place, and time.  ?   Motor: Weakness present.  ?Psychiatric:     ?   Behavior: Behavior normal.  ?         ? ?Vital Signs: BP 116/76 (BP Location: Right Arm)   Pulse 78   Temp 98.8 ?F (37.1 ?C) (Oral)   Resp 18   SpO2 95%  ?SpO2: SpO2: 95 % ?O2 Device: O2 Device: Room Air ? ? ? ?LBM: Last BM Date : 04/24/21 ? ?   ?Palliative Assessment/Data: PPS 40% ? ? ? ? ?Palliative Medicine Assessment & Plan  ? ?Assessment: ?Principal Problem: ?  Cauda equina compression (HCC) ?Active Problems: ?  Type 2 diabetes mellitus ([place]) ?  Dyslipidemia ?  Anemia ?  Chronic pain syndrome ?  Seizure ([place]) ?  COPD (chronic obstructive pulmonary disease) ([place]) ?  Chronic diastolic congestive heart failure ([place]) ?  History of drug abuse in remission  [facility]) ?  Essential hypertension ?  OSA and COPD overlap syndrome ([place]) ?  Anoxic brain injury ([place]) ?  Carotid artery  stenosis ?  Stage 3b chronic kidney disease (CKD) ([place]) ?  Gout ?  Schizoaffective disorder, bipolar type ([place]) ?  PTSD (post-traumatic stress disorder) ?  Acute renal failure superimposed on stage 3b chronic kidney disease ([name]) ?  Schizophrenia, schizo-affective ([place]) ?  GERD (gastroesophageal reflux disease) ?  ? ?Recommendations/Plan: ?Full code full scope ?Continue all current interventions ?She confirms she is not interested in surgical intervention ?[name] only willing to go to rehab if it is a facility where she she will receive quality care ?Last BM 04/24/21 - start sennakot 2 tablets daily at bedtime ?PMT will continue to follow ?  ?Symptom management: ?MS Contin 15 mg every 12 hours ?Oxycodone-acetaminophen 5-325 mg every 6 hours as needed for severe pain ?Neurontin 200 mg 3 times daily ? ?Decision maker: ?Patient has capacity to make her own decisions  ?She declines to designate a healthcare agent ?If she was unable to make decisions for herself, her next of kin would be her son and daughter ? ?Prognosis: ? Unable to determine ? ?Discharge Planning: ?To Be Determined ? ? ? ?Thank you for allowing the Palliative Medicine Team to assist in the care of this patient. ? ? ?MDM - moderate ? ? ?Lavena Bullion, NP ? ? ?Please contact Palliative Medicine Team phone at [phone] for questions and concerns.  ?For individual providers, please see AMION. ? ? ? ?  ?

## 2021-04-28 DIAGNOSIS — G834 Cauda equina syndrome: Secondary | ICD-10-CM | POA: Diagnosis not present

## 2021-04-28 DIAGNOSIS — R531 Weakness: Secondary | ICD-10-CM | POA: Diagnosis not present

## 2021-04-28 DIAGNOSIS — G894 Chronic pain syndrome: Secondary | ICD-10-CM | POA: Diagnosis not present

## 2021-04-28 DIAGNOSIS — I5032 Chronic diastolic (congestive) heart failure: Secondary | ICD-10-CM | POA: Diagnosis not present

## 2021-04-28 LAB — COMPREHENSIVE METABOLIC PANEL
ALT: 23 U/L (ref 0–44)
AST: 23 U/L (ref 15–41)
Albumin: 2.8 g/dL — ABNORMAL LOW (ref 3.5–5.0)
Alkaline Phosphatase: 117 U/L (ref 38–126)
Anion gap: 4 — ABNORMAL LOW (ref 5–15)
BUN: 29 mg/dL — ABNORMAL HIGH (ref 8–23)
CO2: 32 mmol/L (ref 22–32)
Calcium: 9.2 mg/dL (ref 8.9–10.3)
Chloride: 104 mmol/L (ref 98–111)
Creatinine, Ser: 1.61 mg/dL — ABNORMAL HIGH (ref 0.44–1.00)
GFR, Estimated: 36 mL/min — ABNORMAL LOW (ref >60)
Glucose, Bld: 92 mg/dL (ref 70–99)
Potassium: 4 mmol/L (ref 3.5–5.1)
Sodium: 140 mmol/L (ref 135–145)
Total Bilirubin: 0.4 mg/dL (ref 0.3–1.2)
Total Protein: 5.4 g/dL — ABNORMAL LOW (ref 6.5–8.1)

## 2021-04-28 LAB — GLUCOSE, CAPILLARY
Glucose-Capillary: 79 mg/dL (ref 70–99)
Glucose-Capillary: 73 mg/dL (ref 70–99)
Glucose-Capillary: 69 mg/dL — ABNORMAL LOW (ref 70–99)
Glucose-Capillary: 87 mg/dL (ref 70–99)
Glucose-Capillary: 104 mg/dL — ABNORMAL HIGH (ref 70–99)

## 2021-04-28 LAB — CBC WITH DIFFERENTIAL/PLATELET
Abs Immature Granulocytes: 0.02 10*3/uL (ref 0.00–0.07)
Basophils Absolute: 0 10*3/uL (ref 0.0–0.1)
Basophils Relative: 0 %
Eosinophils Absolute: 0.2 10*3/uL (ref 0.0–0.5)
Eosinophils Relative: 4 %
HCT: 29 % — ABNORMAL LOW (ref 36.0–46.0)
Hemoglobin: 8.8 g/dL — ABNORMAL LOW (ref 12.0–15.0)
Immature Granulocytes: 0 %
Lymphocytes Relative: 34 %
Lymphs Abs: 1.6 10*3/uL (ref 0.7–4.0)
MCH: 28.4 pg (ref 26.0–34.0)
MCHC: 30.3 g/dL (ref 30.0–36.0)
MCV: 93.5 fL (ref 80.0–100.0)
Monocytes Absolute: 0.5 10*3/uL (ref 0.1–1.0)
Monocytes Relative: 11 %
Neutro Abs: 2.4 10*3/uL (ref 1.7–7.7)
Neutrophils Relative %: 51 %
Platelets: 279 10*3/uL (ref 150–400)
RBC: 3.1 MIL/uL — ABNORMAL LOW (ref 3.87–5.11)
RDW: 14.8 % (ref 11.5–15.5)
WBC: 4.7 10*3/uL (ref 4.0–10.5)
nRBC: 0 % (ref 0.0–0.2)

## 2021-04-28 LAB — BRAIN NATRIURETIC PEPTIDE: B Natriuretic Peptide: 14.2 pg/mL (ref 0.0–100.0)

## 2021-04-28 LAB — MAGNESIUM: Magnesium: 1.9 mg/dL (ref 1.7–2.4)

## 2021-04-28 MED ORDER — SENNA 8.6 MG PO TABS
2.0000 | ORAL_TABLET | Freq: Every day | ORAL | Status: DC
  Administered 2021-04-28 – 2021-05-01 (×3): 17.2 mg via ORAL
  Filled 2021-04-28 (×3): qty 2

## 2021-04-28 MED ORDER — LACTATED RINGERS IV SOLN
INTRAVENOUS | Status: AC
Start: 2021-04-28 — End: 2021-04-28

## 2021-04-28 NOTE — Care Management Important Message (Signed)
Important Message ? ?Patient Details  ?Name: Tiffany [name] ?MRN: [id number] ?Date of Birth: 1960-04-24 ? ? ?Medicare Important Message Given:  Yes ? ? ? ? ?Codey Burling ?04/28/2021, 3:00 PM ?

## 2021-04-28 NOTE — TOC Initial Note (Signed)
Transition of Care (TOC) - Initial/Assessment Note  ? ? ?Patient Details  ?Name: Tiffany [name] ?MRN: [id number] ?Date of Birth: 02-08-1960 ? ?Transition of Care [facility]) CM/SW Contact:    ?Geralynn Ochs, LCSW ?Phone Number: ?04/28/2021, 3:09 PM ? ?Clinical Narrative:          [name] met with patient to discuss recommendation for SNF. Patient agreeable, but does not want to return to [facility]. [name] faxed out referral, and at this time only [facility] has offered. [name] has asked pending referrals to please review. [name] to follow.       ? ? ?Expected Discharge Plan: [place] ?Barriers to Discharge: Continued Medical Work up, Ship broker ? ? ?Patient Goals and CMS Choice ?Patient states their goals for this hospitalization and ongoing recovery are:: to get better ?CMS Medicare.gov Compare Post Acute Care list provided to:: Patient ?Choice offered to / list presented to : Patient ? ?Expected Discharge Plan and Services ?Expected Discharge Plan: [place] ?  ?  ?Post Acute Care Choice: [place] ?Living arrangements for the past 2 months: [place] ?Expected Discharge Date:  (unknown)               ?  ?  ?  ?  ?  ?  ?  ?  ?  ?  ? ?Prior Living Arrangements/Services ?Living arrangements for the past 2 months: [place] ?Lives with:: Self ?Patient language and need for interpreter reviewed:: No ?Do you feel safe going back to the place where you live?: Yes      ?Need for Family Participation in Patient Care: No (Comment) ?Care giver support system in place?: No (comment) ?  ?Criminal Activity/Legal Involvement Pertinent to Current Situation/Hospitalization: No - Comment as needed ? ?Activities of Daily Living ?Home Assistive Devices/Equipment: Eyeglasses, CPAP, Walker (specify type), Wheelchair, Nebulizer ?ADL Screening (condition at time of admission) ?Patient's cognitive ability adequate to safely complete daily activities?:  Yes ?Is the patient deaf or have difficulty hearing?: No ?Does the patient have difficulty seeing, even when wearing glasses/contacts?: No ?Does the patient have difficulty concentrating, remembering, or making decisions?: No ?Patient able to express need for assistance with ADLs?: Yes ?Does the patient have difficulty dressing or bathing?: Yes (secondary to weakness.  Patient reports that she can't walk) ?Independently performs ADLs?: No (secondary to weakness.  Patient reports that she can't walk) ?Communication: Independent ?Dressing (OT): Needs assistance ?Is this a change from baseline?: Change from baseline, expected to last >3 days ?Grooming: Needs assistance ?Is this a change from baseline?: Change from baseline, expected to last >3 days ?Feeding: Needs assistance ?Is this a change from baseline?: Change from baseline, expected to last >3 days ?Bathing: Needs assistance (Sponge bathes) ?Is this a change from baseline?: Change from baseline, expected to last >3 days ?Toileting: Needs assistance ?Is this a change from baseline?: Change from baseline, expected to last >3days ?In/Out Bed: Needs assistance ?Is this a change from baseline?: Change from baseline, expected to last >3 days ?Walks in Home: Needs assistance ?Is this a change from baseline?: Change from baseline, expected to last >3 days ?Does the patient have difficulty walking or climbing stairs?: Yes (secondary to weakness.  Patient reports that she can't walk) ?Weakness of Legs: Both ?Weakness of Arms/Hands: Both ? ?Permission Sought/Granted ?Permission sought to share information with : Customer service manager ?  ?   ? Permission granted to share info w AGENCY: SNF ?   ?   ? ?  Emotional Assessment ?Appearance:: Appears older than stated age ?Attitude/Demeanor/Rapport: Engaged ?Affect (typically observed): Appropriate ?Orientation: : Oriented to Self, Oriented to Place, Oriented to  Time, Oriented to Situation ?Alcohol / Substance Use: Not  Applicable ?Psych Involvement: No (comment) ? ?Admission diagnosis:  Weakness [R53.1] ?Cauda equina compression (HCC) [G83.4] ?Abdominal pain, unspecified abdominal location [R10.9] ?Patient Active Problem List  ? Diagnosis Date Noted  ? Cauda equina compression ([place]) 04/24/2021  ? Hematochezia   ? Hyperlipidemia 03/31/2021  ? GI bleed 03/30/2021  ? Acute GI bleeding 03/29/2021  ? Atypical chest pain 01/13/2021  ? GERD (gastroesophageal reflux disease)   ? Neuropathy 11/18/2020  ? Pain in both feet 11/18/2020  ? Localized edema 11/18/2020  ? Encounter for medication management 11/17/2020  ? Protein-calorie malnutrition, severe 05/15/2020  ? Malnutrition of moderate degree 05/13/2020  ? Periumbilical abdominal pain   ? Dysphagia   ? Nausea vomiting and diarrhea   ? Generalized abdominal pain   ? E. coli UTI 02/20/2020  ? Acute metabolic encephalopathy 15/05/6977  ? Psychosis ([place]) 02/20/2020  ? Schizophrenia, schizo-affective ([place]) 02/20/2020  ? Metabolic acidosis, normal anion gap (NAG) 02/20/2020  ? Altered mental status   ? Hypocalcemia   ? AKI (acute kidney injury) ([place]) 02/18/2020  ? Pelvic mass in female 01/02/2020  ? Genital herpes 11/25/2019  ? Stage 2 skin ulcer of sacral region ([place]) 10/28/2019  ? Acute renal failure superimposed on stage 3b chronic kidney disease ([place]) 10/27/2019  ? Family discord 02/04/2019  ? Aggressive behavior   ? PTSD (post-traumatic stress disorder) 05/27/2018  ? Schizoaffective disorder, bipolar type ([place]) 04/05/2018  ? Closed displaced fracture of fifth metacarpal bone 03/21/2018  ? Difficulty with speech 01/24/2018  ? Encephalopathy 11/21/2017  ? Drug abuse ([place]) 11/21/2017  ? Frequent falls 10/11/2017  ? Binge eating disorder   ? Dependence on continuous supplemental oxygen 05/14/2017  ? Gout 04/11/2017  ? Hypomagnesemia   ? Stage 3b chronic kidney disease (CKD) ([place]) 12/15/2016  ? Carotid artery stenosis   ? Hypokalemia   ? Osteoarthritis 10/26/2016  ? Anoxic brain injury ([place])  09/08/2016  ? Overactive bladder 06/07/2016  ? QT prolongation   ? OSA and COPD overlap syndrome ([place])   ? Arthritis   ? Essential hypertension 03/22/2016  ? Cocaine use disorder, severe, in sustained remission ([place]) 12/17/2015  ? History of drug abuse in remission ([place]) 11/28/2015  ? Hyponatremia 11/25/2015  ? Chronic diastolic congestive heart failure ([place])   ? Chronic respiratory failure with hypoxia ([place]) 06/22/2015  ? Tobacco use disorder 07/22/2014  ? COPD (chronic obstructive pulmonary disease) ([place]) 07/08/2014  ? Seizure ([place]) 01/04/2013  ? Chronic pain syndrome 06/18/2012  ? Dyslipidemia 04/24/2011  ? Anemia 04/24/2011  ? Diabetic neuropathy ([place]) 04/24/2011  ? Morbid obesity ([place]) 10/18/2010  ? Type 2 diabetes mellitus ([place]) 10/18/2010  ? ?PCP:  Lin Landsman, MD ?Pharmacy:   ?[facility] [place] ?[place] [address] ?Phone: [phone] Fax: [phone] ? ?[place] [place] [facility] [address] ?[place] ?[place] [address] ?Phone: [phone] Fax: [phone] ? ? ? ? ?Social Determinants of Health (SDOH) Interventions ?  ? ?Readmission Risk Interventions ? ?  07/26/2020  ?  2:21 PM 02/23/2020  ?  1:16 PM  ?Readmission Risk Prevention Plan  ?Transportation Screening Complete Complete  ?Medication Review Press photographer) Complete Complete  ?  PCP or Specialist appointment within 3-5 days of discharge Complete Complete  ?[place] or Home Care Consult Complete Complete  ?SW Recovery Care/Counseling Consult Complete Complete  ?Palliative Care Screening Not Applicable Complete  ?Skilled Nursing Facility Complete Complete  ? ? ? ?

## 2021-04-28 NOTE — Progress Notes (Signed)
Occupational Therapy Treatment ?Patient Details ?Name: Tiffany [name] ?MRN: [id number] ?DOB: January 28, 1960 ?Today's Date: 04/28/2021 ? ? ?History of present illness Pt is 61 yo female admitted on 04/24/21 with concern for cauda equina syndrome. Neurosurgery consulted and felt pt would benefit anatomically from surgery but may not be a good candidate due to psychiatric concerns (psych consult ordered).  Pt with hx of diabetes, CAD, chronic pain, COPD, CHF, substance use in remission, carotid artery disease, schizophrenia, PTSD, gout, anemia, GERD, hyperlipidemia, seizure, OSA, cardiac arrest, anoxic brain injury, CKD 3B ?  ?OT comments ? Pt making limited progress with OT goals. This session, pt's cognition and behavior was fluctuating. She required frequent verbal cuing, max encouragement, and increased time to participate. She had difficulty following multistep commands, OT had to provide simple, step-by-step commands for pt to follow. Required +2 total Assist for all mobility this session, as pt was unable to activate BLE at all and minimally assisting with BUE. Continuing to recommend SNF to maximize independence and safety. OT will follow acutely.   ? ?Recommendations for follow up therapy are one component of a multi-disciplinary discharge planning process, led by the attending physician.  Recommendations may be updated based on patient status, additional functional criteria and insurance authorization. ?   ?Follow Up Recommendations ? Skilled nursing-short term rehab (<3 hours/day)  ?  ?Assistance Recommended at Discharge Frequent or constant Supervision/Assistance  ?Patient can return home with the following ? Assistance with cooking/housework;Assistance with feeding;Direct supervision/assist for medications management;Direct supervision/assist for financial management;Assist for transportation;Help with stairs or ramp for entrance;Two people to help with walking and/or transfers;Two people to help with  bathing/dressing/bathroom ?  ?Equipment Recommendations ? None recommended by OT  ?  ?Recommendations for Other Services   ? ?  ?Precautions / Restrictions Precautions ?Precautions: Fall;Other (comment) ?Precaution Comments: back precautions due to cauda equina ?Restrictions ?Weight Bearing Restrictions: No  ? ? ?  ? ?Mobility Bed Mobility ?Overal bed mobility: Needs Assistance ?Bed Mobility: Supine to Sit ?  ?  ?Supine to sit: Max assist, +2 for physical assistance, +2 for safety/equipment ?  ?  ?General bed mobility comments: Modified helicopter manuever. Total assist for BLE and max A to support trunk and pull to sitting EOB, total A +2 to scoot forward on EOB ?  ? ?Transfers ?Overall transfer level: Needs assistance ?Equipment used: 2 person hand held assist ?Transfers: Bed to chair/wheelchair/BSC ?  ?  ?  ?  ?  ? Lateral/Scoot Transfers: Total assist, +2 physical assistance, +2 safety/equipment ?General transfer comment: Pt provided no assist with scooting to chair, loosely held on to therapists arms ?  ?  ?Balance Overall balance assessment: Needs assistance ?Sitting-balance support: Single extremity supported, Feet supported ?Sitting balance-Leahy Scale: Fair ?Sitting balance - Comments: Could sit EOB statically, unable to tolerate challenge to balance dynamically ?  ?  ?  ?  ?  ?  ?  ?  ?  ?  ?  ?  ?  ?  ?  ?   ? ?ADL either performed or assessed with clinical judgement  ? ?ADL Overall ADL's : Needs assistance/impaired ?  ?  ?  ?  ?  ?  ?  ?  ?  ?  ?  ?  ?Toilet Transfer: Total assistance;+2 for physical assistance;+2 for safety/equipment ?Toilet Transfer Details (indicate cue type and reason): Required +2 lateral scoot- no active muscle firing to assist with transfer ?  ?  ?  ?  ?  ?General ADL  Comments: Limited due to pt cognition this session, required max encouragment and cuing ?  ? ?Extremity/Trunk Assessment   ?  ?  ?  ?  ?  ? ?Vision   ?  ?  ?Perception   ?  ?Praxis   ?  ? ?Cognition  Arousal/Alertness: Awake/alert ?Behavior During Therapy: Anxious ?Overall Cognitive Status: Impaired/Different from baseline ?Area of Impairment: Following commands, Safety/judgement, Attention, Problem solving ?  ?  ?  ?  ?  ?  ?  ?  ?  ?Current Attention Level: Sustained ?  ?Following Commands: Follows one step commands with increased time ?Safety/Judgement: Decreased awareness of safety, Decreased awareness of deficits ?  ?Problem Solving: Slow processing, Decreased initiation, Requires verbal cues, Requires tactile cues ?General Comments: Pt very easily agitated this session, requiring increased time to process and 1 step commands. Easily distracted, requiring multimodal cues to redirect. At times pt seems aware of deficits, other times states that "she can do it with no help" ?  ?  ?   ?Exercises   ? ?  ?Shoulder Instructions   ? ? ?  ?General Comments VSS onRA, pt very easily agitated, most likely due to fear and cognitive deficits  ? ? ?Pertinent Vitals/ Pain       Pain Assessment ?Pain Assessment: Faces ?Faces Pain Scale: Hurts little more ?Pain Location: LLE ?Pain Descriptors / Indicators: Aching, Discomfort, Guarding ?Pain Intervention(s): Monitored during session, Repositioned ? ?Home Living   ?  ?  ?  ?  ?  ?  ?  ?  ?  ?  ?  ?  ?  ?  ?  ?  ?  ?  ? ?  ?Prior Functioning/Environment    ?  ?  ?  ?   ? ?Frequency ? Min 2X/week  ? ? ? ? ?  ?Progress Toward Goals ? ?OT Goals(current goals can now be found in the care plan section) ? Progress towards OT goals: Progressing toward goals ? ?Acute Rehab OT Goals ?Patient Stated Goal: To lessen pain ?OT Goal Formulation: With patient ?Time For Goal Achievement: 05/09/21 ?Potential to Achieve Goals: Fair ?ADL Goals ?Pt Will Perform Grooming: with modified independence;sitting ?Pt Will Perform Lower Body Bathing: sitting/lateral leans;with adaptive equipment;sit to/from stand;with min assist ?Pt Will Perform Upper Body Dressing: with supervision;sitting ?Pt Will  Transfer to Toilet: with min assist;squat pivot transfer;bedside commode ?Additional ADL Goal #1: patient to scoot transfer to and from drop arm commode with min A  to increase ability to participate in toileting tasks  ?Plan Discharge plan remains appropriate;Frequency remains appropriate   ? ?Co-evaluation ? ? ? PT/OT/SLP Co-Evaluation/Treatment: Yes ?Reason for Co-Treatment: Complexity of the patient's impairments (multi-system involvement);Necessary to address cognition/behavior during functional activity;For patient/therapist safety;To address functional/ADL transfers ?PT goals addressed during session: Mobility/safety with mobility ?OT goals addressed during session: Other (comment) (cognition, transfers, and sitting balance) ?  ? ?  ?AM-PAC OT "6 Clicks" Daily Activity     ?Outcome Measure ? ? Help from another person eating meals?: A Little ?Help from another person taking care of personal grooming?: A Little ?Help from another person toileting, which includes using toliet, bedpan, or urinal?: Total ?Help from another person bathing (including washing, rinsing, drying)?: A Lot ?Help from another person to put on and taking off regular upper body clothing?: A Little ?Help from another person to put on and taking off regular lower body clothing?: Total ?6 Click Score: 13 ? ?  ?End of Session Equipment Utilized During  Treatment: Gait belt ? ?OT Visit Diagnosis: Unsteadiness on feet (R26.81);Other abnormalities of gait and mobility (R26.89);Muscle weakness (generalized) (M62.81);Pain ?Pain - Right/Left: Left ?Pain - part of body: Leg ?  ?Activity Tolerance Patient tolerated treatment well ?  ?Patient Left in chair;with call bell/phone within reach;with chair alarm set ?  ?Nurse Communication Mobility status;Need for lift equipment ?  ? ?   ? ?Time: 9826-4158 ?OT Time Calculation (min): 40 min ? ?Charges: OT General Charges ?$OT Visit: 1 Visit ?OT Treatments ?$Therapeutic Activity: 8-22 mins ? ?Leory Allinson H.,  OTR/L ?Acute Rehabilitation ? ?Marthe Dant Elane [name] ?04/28/2021, 1:00 PM ?

## 2021-04-28 NOTE — Progress Notes (Signed)
?PROGRESS NOTE ? ? ? ?Tiffany [name]  ZOX:[id number] DOB: November 07, 1960 DOA: 04/24/2021 ?PCP: Lin Landsman, MD  ? ?Brief Narrative:  ?61 y.o. female with medical history significant of diabetes, CAD, chronic pain, COPD, CHF, substance use in remission, carotid artery disease, schizophrenia, PTSD, gout, anemia, GERD, hyperlipidemia, seizure, OSA, cardiac arrest, anoxic brain injury, CKD 3B presented with worsening abdominal pain, back pain and leg weakness.  On presentation, creatinine was 1.89. CT of the abdomen pelvis:increased endplate destruction of L3-L4 consistent with spondylodiscitis also with L3-4 anterior listhesis and severe worsening of spinal stenosis; No appreciable abscess; Stable adnexal cyst; Constipation and diverticulosis without diverticulitis noted.  MRI thoracic spine did not show any acute pathology.  MRI lumbar spine was concerning for cauda equina syndrome.    ? ? ? ?Subjective:  Patient in bed, appears comfortable, denies any headache, no fever, no chest pain or pressure, no shortness of breath , no abdominal pain.  Onik bilateral lower extremity weakness unchanged, back pain much improved. ? ? ?Assessment & Plan: ?  ?Low back pain, radiating to R>>L Leg with chronic bilateral lower extremity weakness 3/5 - mild possible worsening lower extremity weakness very weak to begin with, MRI T and L-spine reviewed suspicion for severe spinal stenosis.  Seen by psychiatry and cleared for capacity, seen by neurosurgery.  Case discussed with neurosurgeon Dr. Christella Noa on 04/25/2021.  He does not think that patient is a good candidate for surgical intervention, also patient herself does not want surgery.  Continue medical management.  Pain in better control after medications were adjusted, continue PT OT and supportive care.  Likely discharge to SNF on 04/29/2021 if medically stable. ?  ?AKI on CKD stage IIIb  - baseline creatinine around 1.6, hold Demadex, gentle IV fluids for 12 more hours on 04/28/2021, renal  function is improved. ?  ?Anemia of chronic disease  - Hemoglobin stable ? ?Chronic pain  - pain medications adjusted we will continue to monitor. ? ?Chronic diastolic CHF   - last echo in 12/2020 showed EF of 60 to 65% with grade 1 diastolic dysfunction. No signs of volume overload.  Strict input and output.  Daily weights.  Fluid restriction.  Continue Coreg.  Outpatient follow-up with cardiology ? ?CAD - Currently stable.  Continue Coreg and statin.  Outpatient follow-up with cardiology.  Currently not on antiplatelets ? ?Dyslipidemia.  Continue statin.   ? ?OSA -Continue home CPAP, she is noncompliant with it.  Counseled on compliance. ? ?Schizophrenia, PTSD, History of psychosis - Psychiatry evaluation pleated, she has the capacity to decide for herself, continue home medications ? ?History of anoxic brain injury, History of cardiac arrest - Palliative care consulted for goals of care discussion. ? ?Hypertension -Blood pressure intermittently elevated.  Continue amlodipine, Coreg ? ?Physical deconditioning  -We will get PT, OT evaluation >> SNF. ? ? ? ?DVT prophylaxis: Heparin ?Code Status: Full ?Family Communication: None at bedside ?Disposition Plan: ?Status is: Inpt ? ?Consultants: Neurosurgery, psychiatry, Pall. Care ? ?Procedures:  ? ?MRI T and L Spine - ? ?1. Study is degraded by moderate to severe motion artifact. Intraspinal detail is limited. 2. No acute findings or high-grade spinal stenosis demonstrated in the thoracic spine. There is mild multilevel spondylosis. 3. As demonstrated on recent CTs, progressive anterolisthesis at L4-5 with discal hyperintensity, endplate irregularity, edema and low level enhancement. Findings could be secondary to chronic discitis/osteomyelitis or instability. No paraspinal fluid collections are identified. There is resulting severe multifactorial spinal stenosis with significant lateral recess  and foraminal narrowing bilaterally, likely contributing to cauda equina  syndrome. 4. Stable mild spinal stenosis and mild lateral recess and left foraminal narrowing at L5-S1. 5. Transitional lumbosacral anatomy. ? ? ? ?Antimicrobials: None ? ? ?Objective: ?Vitals:  ? 04/28/21 0019 04/28/21 0345 04/28/21 0714 04/28/21 0917  ?BP: 122/79 126/72 134/85   ?Pulse: 78 80 81   ?Resp: '16 15 15 18  '$ ?Temp: 97.8 ?F (36.6 ?C) 98.7 ?F (37.1 ?C) 98.8 ?F (37.1 ?C)   ?TempSrc: Oral Oral Oral   ?SpO2: 95% 96% 92% 94%  ? ? ?Intake/Output Summary (Last 24 hours) at 04/28/2021 1024 ?Last data filed at 04/28/2021 0746 ?Gross per 24 hour  ?Intake 720 ml  ?Output 700 ml  ?Net 20 ml  ? ?There were no vitals filed for this visit. ? ?Examination: ? ?Awake Alert, No new F.N deficits, bilateral lower extremity strength at baseline appears to be 3/5 - left more weaker than right ?[place].AT,PERRAL ?Supple Neck, No JVD,   ?Symmetrical Chest wall movement, Good air movement bilaterally, CTAB ?RRR,No Gallops, Rubs or new Murmurs,  ?+ve B.Sounds, Abd Soft, No tenderness,   ?No Cyanosis, Clubbing or edema  ? ? ? ? ?Data Reviewed: I have personally reviewed following labs and imaging studies ?Recent Labs  ?Lab 04/24/21 ?0500 04/25/21 ?8527 04/26/21 ?7824 04/27/21 ?2353 04/28/21 ?0233  ?WBC 6.0 5.2 5.7 5.6 4.7  ?HGB 10.3* 10.7* 10.1* 10.0* 8.8*  ?HCT 34.1* 33.6* 30.8* 31.5* 29.0*  ?PLT 320 300 288 275 279  ?MCV 94.5 92.3 90.3 92.6 93.5  ?MCH 28.5 29.4 29.6 29.4 28.4  ?MCHC 30.2 31.8 32.8 31.7 30.3  ?RDW 15.2 14.8 14.6 14.9 14.8  ?LYMPHSABS  --   --  1.5 1.6 1.6  ?MONOABS  --   --  0.5 0.6 0.5  ?EOSABS  --   --  0.2 0.2 0.2  ?BASOSABS  --   --  0.0 0.0 0.0  ? ? ?Recent Labs  ?Lab 04/24/21 ?0651 04/25/21 ?6144 04/25/21 ?0645 04/26/21 ?3154 04/27/21 ?0086 04/28/21 ?0233  ?NA 137 138  --  137 138 140  ?K 5.6* 4.3  --  3.7 3.6 4.0  ?CL 106 106  --  100 102 104  ?CO2 21* 26  --  29 29 32  ?GLUCOSE 82 87  --  135* 117* 92  ?BUN 31* 24*  --  31* 31* 29*  ?CREATININE 1.89* 1.10*  --  1.83* 1.77* 1.61*  ?CALCIUM 9.7 9.3  --  9.1 9.0 9.2   ?AST 34 26  --  '30 24 23  '$ ?ALT 27 24  --  '23 20 23  '$ ?ALKPHOS 146* 142*  --  147* 125 117  ?BILITOT 0.3 0.4  --  0.6 0.2* 0.4  ?ALBUMIN 4.0 3.7  --  3.0* 3.0* 2.8*  ?MG  --   --   --  1.8 1.7 1.9  ?INR  --   --  1.0  --   --   --   ?BNP  --  48.0  --  26.4 11.9 14.2  ? ? ?  ?  ?Radiology Studies: ?No results found. ? ?Scheduled Meds: ? amLODipine  10 mg Oral Daily  ? asenapine  10 mg Sublingual BID  ? atorvastatin  20 mg Oral Daily  ? carvedilol  6.25 mg Oral BID WC  ? dicyclomine  10 mg Oral TID AC  ? DULoxetine  30 mg Oral BID  ? escitalopram  20 mg Oral q morning  ? fluticasone furoate-vilanterol  1 puff Inhalation Daily  ? And  ? umeclidinium bromide  1 puff Inhalation Daily  ? gabapentin  200 mg Oral TID  ? heparin injection (subcutaneous)  5,000 Units Subcutaneous Q8H  ? insulin aspart  0-9 Units Subcutaneous TID WC  ? montelukast  10 mg Oral QHS  ? morphine  15 mg Oral Q12H  ? naLOXone (NARCAN)  injection  0.2 mg Intravenous Once  ? pantoprazole  40 mg Oral Daily  ? prazosin  2 mg Oral QHS  ? senna  2 tablet Oral QHS  ? sodium chloride flush  3 mL Intravenous Q12H  ? ?Continuous Infusions: ? lactated ringers 75 mL/hr at 04/28/21 7218  ? ? ?Signature ? ?Lala Lund M.D on 04/28/2021 at 10:24 AM   -  To page go to [email or web address]  ? ?  ? ?

## 2021-04-28 NOTE — TOC Progression Note (Signed)
Transition of Care (TOC) - Progression Note  ? ? ?Patient Details  ?Name: MIRRA BASILIO ?MRN: [id number] ?Date of Birth: [birth year] ? ?Transition of Care (TOC) CM/SW Contact  ?Geralynn Ochs, LCSW ?Phone Number: ?04/28/2021, 3:10 PM ? ?Clinical Narrative:   Patient continues to only have bed offer at [facility]. [name] met with patient to update her, asked if she would consider [facility] or if she'd like to fax further outside of [facility] for options. Patient asking to look further outside of [place] rather than return to [place]. [name] has faxed patient out further, awaiting bed offers. [name] to follow. ? ? ? ?Expected Discharge Plan: [place] ?Barriers to Discharge: Continued Medical Work up, Ship broker ? ?Expected Discharge Plan and Services ?Expected Discharge Plan: [place] ?  ?  ?Post Acute Care Choice: [place] ?Living arrangements for the past 2 months: [place] ?Expected Discharge Date:  (unknown)               ?  ?  ?  ?  ?  ?  ?  ?  ?  ?  ? ? ?Social Determinants of Health (SDOH) Interventions ?  ? ?Readmission Risk Interventions ? ?  07/26/2020  ?  2:21 PM 02/23/2020  ?  1:16 PM  ?Readmission Risk Prevention Plan  ?Transportation Screening Complete Complete  ?Medication Review Press photographer) Complete Complete  ?PCP or Specialist appointment within 3-5 days of discharge Complete Complete  ?[place] or Home Care Consult Complete Complete  ?SW Recovery Care/Counseling Consult Complete Complete  ?Palliative Care Screening Not Applicable Complete  ?Skilled Nursing Facility Complete Complete  ? ? ?

## 2021-04-28 NOTE — Progress Notes (Signed)
Physical Therapy Treatment ?Patient Details ?Name: Tiffany [name] ?MRN: [id number] ?DOB: [birth year] ?Today's Date: 04/28/2021 ? ? ?History of Present Illness Pt is 61 yo female admitted on 04/24/21 with concern for cauda equina syndrome. Neurosurgery consulted and felt pt would benefit anatomically from surgery but may not be a good candidate due to psychiatric concerns (psych consult ordered).  Pt with hx of diabetes, CAD, chronic pain, COPD, CHF, substance use in remission, carotid artery disease, schizophrenia, PTSD, gout, anemia, GERD, hyperlipidemia, seizure, OSA, cardiac arrest, anoxic brain injury, CKD 3B ? ?  ?PT Comments  ? ? Co-treat today with OT, focus was functional bed mobility, seated balance, and lateral scoot transfer. The pt presents with significant agitation throughout the session as well as 0/5 B LE strength grossly. She has decreased bed mobility and limited transfer capacity. She required max A +2 for bed mobility and was total +2 for transfers. She was able to tolerate prolonged EOB sitting with no note LOB. Pt. Would benefit from skilled PT to continue to address her functional strength, mobility, balance, and transfer ability as tolerated. Plan and discharge setting remains unchanged. Pt to follow acutely as appropriate.  ?   ?Recommendations for follow up therapy are one component of a multi-disciplinary discharge planning process, led by the attending physician.  Recommendations may be updated based on patient status, additional functional criteria and insurance authorization. ? ?Follow Up Recommendations ? Skilled nursing-short term rehab (<3 hours/day) ?  ?  ?Assistance Recommended at Discharge Frequent or constant Supervision/Assistance  ?Patient can return home with the following Two people to help with walking and/or transfers;Two people to help with bathing/dressing/bathroom;Help with stairs or ramp for entrance;Assistance with cooking/housework;Direct supervision/assist for financial  management;Assist for transportation;Direct supervision/assist for medications management ?  ?Equipment Recommendations ? Other (comment) (defer to next venue)  ?  ?Recommendations for Other Services   ? ? ?  ?Precautions / Restrictions Precautions ?Precautions: Fall;Other (comment) ?Precaution Comments: back precautions due to cauda equina ?Restrictions ?Weight Bearing Restrictions: No  ?  ? ?Mobility ? Bed Mobility ?Overal bed mobility: Needs Assistance ?Bed Mobility: Supine to Sit ?  ?  ?Supine to sit: Max assist, +2 for physical assistance, +2 for safety/equipment ?  ?  ?General bed mobility comments: Modified helicopter manuever. Total assist for BLE and max A to support trunk and pull to sitting EOB, total A +2 to scoot forward on EOB ?Patient Response: Anxious, Impulsive ? ?Transfers ?Overall transfer level: Needs assistance ?  ?Transfers: Bed to chair/wheelchair/BSC ?  ?  ?  ?  ?  ? Lateral/Scoot Transfers: Total assist, +2 physical assistance ?General transfer comment: Pt hesitant to perform trasnfers and requires max encouragment. Total +2 using pad for boost and trasnfer to recliner. She shows no volitional activation of her LE. ?  ? ?Ambulation/Gait ?  ?  ?  ?  ?  ?  ?  ?  ? ? ?Stairs ?  ?  ?  ?  ?  ? ? ?Wheelchair Mobility ?  ? ?Modified Rankin (Stroke Patients Only) ?  ? ? ?  ?Balance Overall balance assessment: Needs assistance ?Sitting-balance support: Single extremity supported, Feet supported ?Sitting balance-Leahy Scale: Fair ?Sitting balance - Comments: Able to maintain EOB sitting, could not tolerate challenge. Required UE support when looking behind her. ?  ?  ?  ?  ?  ?  ?  ?  ?  ?  ?  ?  ?  ?  ?  ?  ? ?  ?  Cognition Arousal/Alertness: Awake/alert ?Behavior During Therapy: Anxious ?Overall Cognitive Status: Impaired/Different from baseline ?Area of Impairment: Following commands, Safety/judgement, Attention, Problem solving ?  ?  ?  ?  ?  ?  ?  ?  ?  ?Current Attention Level: Sustained ?   ?Following Commands: Follows one step commands with increased time ?Safety/Judgement: Decreased awareness of safety, Decreased awareness of deficits ?  ?Problem Solving: Slow processing, Decreased initiation, Requires verbal cues, Requires tactile cues ?General Comments: Pt. waxing and waning with agitation levels and requires max encouragement throughout session. [name] easily irritated but able to reorient. Required cues to orient to place. ?  ?  ? ?  ?Exercises General Exercises - Lower Extremity ?Hip Flexion/Marching: AAROM, Both, Supine (Attempted supine marching, pt with no active control on LE and reports L LE discomfort) ? ?  ?General Comments General comments (skin integrity, edema, etc.): Pt easily agitated throughout session, emotional state was volitile. She would start by smiling, then getting irritated, then go back to smiling. ?  ?  ? ?Pertinent Vitals/Pain Pain Assessment ?Pain Assessment: Faces ?Faces Pain Scale: Hurts little more ?Pain Location: LLE ?Pain Descriptors / Indicators: Aching, Discomfort, Guarding ?Pain Intervention(s): Monitored during session, Repositioned, Premedicated before session  ? ? ?Home Living   ?  ?  ?  ?  ?  ?  ?  ?  ?  ?   ?  ?Prior Function    ?  ?  ?   ? ?PT Goals (current goals can now be found in the care plan section) Acute Rehab PT Goals ?Patient Stated Goal: "I want to move more" ?PT Goal Formulation: With patient ?Time For Goal Achievement: 05/09/21 ?Potential to Achieve Goals: Fair ?Progress towards PT goals: Not progressing toward goals - comment (Pt hesitant to move and easily aggitated) ? ?  ?Frequency ? ? ? Min 2X/week ? ? ? ?  ?PT Plan Current plan remains appropriate  ? ? ?Co-evaluation PT/OT/SLP Co-Evaluation/Treatment: Yes ?Reason for Co-Treatment: Complexity of the patient's impairments (multi-system involvement);Necessary to address cognition/behavior during functional activity;For patient/therapist safety;To address functional/ADL transfers ?PT goals  addressed during session: Mobility/safety with mobility ?OT goals addressed during session: Other (comment) (cognition, transfers, and sitting balance) ?  ? ?  ?AM-PAC PT "6 Clicks" Mobility   ?Outcome Measure ? Help needed turning from your back to your side while in a flat bed without using bedrails?: A Lot ?Help needed moving from lying on your back to sitting on the side of a flat bed without using bedrails?: Total ?Help needed moving to and from a bed to a chair (including a wheelchair)?: Total ?Help needed standing up from a chair using your arms (e.g., wheelchair or bedside chair)?: Total ?Help needed to walk in hospital room?: Total ?Help needed climbing 3-5 steps with a railing? : Total ?6 Click Score: 7 ? ?  ?End of Session Equipment Utilized During Treatment: Gait belt ?Activity Tolerance: Treatment limited secondary to agitation ?Patient left: with call bell/phone within reach;in chair;with chair alarm set ?Nurse Communication: Mobility status ?PT Visit Diagnosis: Muscle weakness (generalized) (M62.81);History of falling (Z91.81);Difficulty in walking, not elsewhere classified (R26.2) ?Pain - part of body: Leg ?  ? ? ?Time: 7408-1448 ?PT Time Calculation (min) (ACUTE ONLY): 37 min ? ?Charges:  $Therapeutic Activity: 8-22 mins          ?          ? ?Thermon Leyland, SPT ?Acute Rehab Services ? ? ? ?Thermon Leyland ?04/28/2021, 1:40 PM ? ?

## 2021-04-29 DIAGNOSIS — G834 Cauda equina syndrome: Secondary | ICD-10-CM | POA: Diagnosis not present

## 2021-04-29 LAB — BPAM RBC
Blood Product Expiration Date: 202305082359
Blood Product Expiration Date: 202305082359
Unit Type and Rh: 5100
Unit Type and Rh: 5100

## 2021-04-29 LAB — TYPE AND SCREEN
ABO/RH(D): O POS
Antibody Screen: NEGATIVE
Donor AG Type: NEGATIVE
Donor AG Type: NEGATIVE
Unit division: 0
Unit division: 0

## 2021-04-29 LAB — GLUCOSE, CAPILLARY
Glucose-Capillary: 92 mg/dL (ref 70–99)
Glucose-Capillary: 79 mg/dL (ref 70–99)
Glucose-Capillary: 132 mg/dL — ABNORMAL HIGH (ref 70–99)
Glucose-Capillary: 84 mg/dL (ref 70–99)

## 2021-04-29 MED ORDER — OXYCODONE-ACETAMINOPHEN 5-325 MG PO TABS: 1.0000 | ORAL_TABLET | Freq: Four times a day (QID) | ORAL | 0 refills | Status: DC | PRN

## 2021-04-29 MED ORDER — MORPHINE SULFATE ER 15 MG PO TBCR: 15.0000 mg | EXTENDED_RELEASE_TABLET | Freq: Two times a day (BID) | ORAL | 0 refills | Status: DC

## 2021-04-29 MED ORDER — GABAPENTIN 100 MG PO CAPS: 200.0000 mg | ORAL_CAPSULE | Freq: Three times a day (TID) | ORAL | Status: DC

## 2021-04-29 MED ORDER — SENNA 8.6 MG PO TABS: 1.0000 | ORAL_TABLET | Freq: Every day | ORAL | 0 refills | Status: DC | PRN

## 2021-04-29 MED ORDER — POLYETHYLENE GLYCOL 3350 17 G PO PACK: 17.0000 g | PACK | Freq: Every day | ORAL | 0 refills | Status: DC | PRN

## 2021-04-29 NOTE — Discharge Instructions (Signed)
Follow with Primary MD Lin Landsman, MD in 7 days  ? ?Get CBC, CMP, 2 view Chest X ray -  checked next visit within 1 week by  SNF MD   ? ?Activity: As tolerated with Full fall precautions use walker/cane & assistance as needed ? ?Disposition SNF ? ?Diet: Heart Healthy with feeding assistance and aspiration precautions. ?  ?Special Instructions: If you have smoked or chewed Tobacco  in the last 2 yrs please stop smoking, stop any regular Alcohol  and or any Recreational drug use. ? ?On your next visit with your primary care physician please Get Medicines reviewed and adjusted. ? ?Please request your Prim.MD to go over all Hospital Tests and Procedure/Radiological results at the follow up, please get all Hospital records sent to your Prim MD by signing hospital release before you go home. ? ?If you experience worsening of your admission symptoms, develop shortness of breath, life threatening emergency, suicidal or homicidal thoughts you must seek medical attention immediately by calling 911 or calling your MD immediately  if symptoms less severe. ? ?You Must read complete instructions/literature along with all the possible adverse reactions/side effects for all the Medicines you take and that have been prescribed to you. Take any new Medicines after you have completely understood and accpet all the possible adverse reactions/side effects.  ? ?  ?

## 2021-04-29 NOTE — TOC Progression Note (Signed)
Transition of Care (TOC) - Progression Note  ? ? ?Patient Details  ?Name: Tiffany [name] ?MRN: [id number] ?Date of Birth: 1960/11/04 ? ?Transition of Care (TOC) CM/SW Contact  ?Geralynn Ochs, LCSW ?Phone Number: ?04/29/2021, 4:26 PM ? ?Clinical Narrative:   [name] worked towards SNF placement today. [name] met with patient this morning to discuss other SNF offers and answer patient questions. Lengthy discussion with patient about options available, and patient ultimately chose [facility] for SNF. [name] initiated insurance authorization request, which is still pending at this time. [name] to continue to follow. ? ? ? ?Expected Discharge Plan: [place] ?Barriers to Discharge: Insurance Authorization ? ?Expected Discharge Plan and Services ?Expected Discharge Plan: [place] ?  ?  ?Post Acute Care Choice: [place] ?Living arrangements for the past 2 months: [place] ?Expected Discharge Date: 04/29/21               ?  ?  ?  ?  ?  ?  ?  ?  ?  ?  ? ? ?Social Determinants of Health (SDOH) Interventions ?  ? ?Readmission Risk Interventions ? ?  07/26/2020  ?  2:21 PM 02/23/2020  ?  1:16 PM  ?Readmission Risk Prevention Plan  ?Transportation Screening Complete Complete  ?Medication Review Press photographer) Complete Complete  ?PCP or Specialist appointment within 3-5 days of discharge Complete Complete  ?[place] or Home Care Consult Complete Complete  ?SW Recovery Care/Counseling Consult Complete Complete  ?Palliative Care Screening Not Applicable Complete  ?Skilled Nursing Facility Complete Complete  ? ? ?

## 2021-04-29 NOTE — Consult Note (Signed)
? ?  [facility] CM Inpatient Consult ? ? ?04/29/2021 ? ?Milas Gain Ransom ?Dec 30, 1960 ?[id number] ? ?[place] Organization [ACO] Patient: [employer] ? ?Primary Care Provider:  Lin Landsman, MD, showing with [facility] ?Met with the patient at the bedside after review of inpatient TOC LCSW for post hospital transitional needs.  Patient resting quietly in bed.  Patient verbalizes having issues with her memory when asked about her PCP and MD office.  She attempted to call family.  Patient noted to be a confidential patient . She states she hopes to go to a different facility for rehab.  Inpatient TOC notes reviewed.   ? ?If the patient goes to a [facility] affiliated facility then, patient can be followed by [place] [facility] PAC RN with traditional Medicare.  ? ? ?Plan:   [facility] PAC RN can follow for any known or needs for transitional care needs for returning to post facility care or complex disease management. ? ?For questions or referrals, please contact: ? ? ?Natividad Brood, RN BSN CCM ?[facility] Liaison ? [phone] business mobile phone ?Toll free office [phone]  ?Fax number: [phone] ?[email or web address] ? ?  ? ?

## 2021-04-29 NOTE — Discharge Summary (Addendum)
?                                                                                ? ?Tiffany [name] QMG:[id number] DOB: [birth year] DOA: 04/24/2021 ? ?PCP: Lin Landsman, MD ? ?Admit date: 04/24/2021  Discharge date: 04/30/2021 ? ?Admitted From: Home   Disposition:  SNF ? ? ?Recommendations for Outpatient Follow-up:  ? ?Follow up with PCP in 1-2 weeks ? ?PCP Please obtain BMP/CBC, 2 view CXR in 1week,  (see Discharge instructions)  ? ?PCP Please follow up on the following pending results:  ? ? ?Home Health: None   ?Equipment/Devices: None  ?Consultations: N.Surg, [place], psychiatry ?Discharge Condition: Stable    ?CODE STATUS: Full    ?Diet Recommendation: Heart Healthy  ?  ? ?Chief Complaint  ?Patient presents with  ? Abdominal Pain  ?  ? ?Brief history of present illness from the day of admission and additional interim summary   ? ?61 y.o. female with medical history significant of diabetes, CAD, chronic pain, COPD, CHF, substance use in remission, carotid artery disease, schizophrenia, PTSD, gout, anemia, GERD, hyperlipidemia, seizure, OSA, cardiac arrest, anoxic brain injury, CKD 3B presented with worsening abdominal pain, back pain and leg weakness.  On presentation, creatinine was 1.89. CT of the abdomen pelvis:increased endplate destruction of L3-L4 consistent with spondylodiscitis also with L3-4 anterior listhesis and severe worsening of spinal stenosis; No appreciable abscess; Stable adnexal cyst; Constipation and diverticulosis without diverticulitis noted.  MRI thoracic spine did not show any acute pathology.  MRI lumbar spine was concerning for cauda equina syndrome.    ? ?                                                               Hospital Course  ? ?Low back pain, radiating to R>>L Leg with chronic bilateral lower extremity weakness 3/5 - mild possible worsening lower extremity weakness very weak to begin with, MRI  T and L-spine reviewed suspicion for severe spinal stenosis.  Seen by psychiatry and cleared for capacity, seen by neurosurgery.  Case discussed with neurosurgeon Dr. Christella Noa on 04/25/2021.  He does not think that patient is a good candidate for surgical intervention, also patient herself does not want surgery.  Continue medical management.  Pain in better control after medications were adjusted, continue PT OT and supportive care.  She was discharged on 04/29/2021 we await SNF bed when available. ?  ?AKI on CKD stage IIIb  - baseline creatinine around 1.6, \renal function is improved.  Resume home dose Demadex from 04/30/2021, check CMP and 4 to 5 days at [facility]. ?  ?Anemia of chronic disease  - Hemoglobin stable ? ?Chronic pain  - pain medications adjusted, currently stable on present regimen. ? ?Chronic diastolic CHF   - last echo in 12/2020 showed EF of 60 to 65% with grade 1 diastolic dysfunction. No signs of volume overload.  Strict input and output.  Daily weights.  Continue Coreg and  home dose of Demadex.  Outpatient follow-up with cardiology ? ?CAD - Currently stable.  Continue Coreg and statin.  Outpatient follow-up with cardiology.  Currently not on antiplatelets ? ?Dyslipidemia.  Continue statin.   ?  ?OSA -Continue home CPAP, she is noncompliant with it.  Counseled on compliance. ? ?Schizophrenia, PTSD, History of psychosis - Psychiatry evaluation pleated, she has the capacity to decide for herself, continue home medications. ?  ?History of anoxic brain injury, History of cardiac arrest - Palliative care consulted for goals of care discussion. ?  ?Hypertension -Blood pressure intermittently elevated.  Continue amlodipine, Coreg ?  ?Physical deconditioning  -We will get PT, OT evaluation >> SNF. ? ?Discharge diagnosis   ? ? ?Principal Problem: ?  Cauda equina compression ([place]) ?Active Problems: ?  Dyslipidemia ?  Anemia ?  Type 2 diabetes mellitus ([place]) ?  Chronic pain syndrome ?  Seizure ([place]) ?  COPD  (chronic obstructive pulmonary disease) ([place]) ?  Chronic diastolic congestive heart failure ([place]) ?  History of drug abuse in remission [facility]) ?  Essential hypertension ?  OSA and COPD overlap syndrome ([place]) ?  Anoxic brain injury ([place]) ?  Carotid artery stenosis ?  Stage 3b chronic kidney disease (CKD) ([place]) ?  Gout ?  Schizoaffective disorder, bipolar type ([place]) ?  PTSD (post-traumatic stress disorder) ?  Acute renal failure superimposed on stage 3b chronic kidney disease ([place]) ?  Schizophrenia, schizo-affective ([place]) ?  GERD (gastroesophageal reflux disease) ? ? ? ?Discharge instructions   ? ?Discharge Instructions   ? ? Diet - low sodium heart healthy   Complete by: As directed ?  ? Discharge instructions   Complete by: As directed ?  ? Follow with Primary MD Lin Landsman, MD in 7 days  ? ?Get CBC, CMP, 2 view Chest X ray -  checked next visit within 1 week by  SNF MD   ? ?Activity: As tolerated with Full fall precautions use walker/cane & assistance as needed ? ?Disposition SNF ? ?Diet: Heart Healthy with feeding assistance and aspiration precautions. ?  ?Special Instructions: If you have smoked or chewed Tobacco  in the last 2 yrs please stop smoking, stop any regular Alcohol  and or any Recreational drug use. ? ?On your next visit with your primary care physician please Get Medicines reviewed and adjusted. ? ?Please request your Prim.MD to go over all Hospital Tests and Procedure/Radiological results at the follow up, please get all Hospital records sent to your Prim MD by signing hospital release before you go home. ? ?If you experience worsening of your admission symptoms, develop shortness of breath, life threatening emergency, suicidal or homicidal thoughts you must seek medical attention immediately by calling 911 or calling your MD immediately  if symptoms less severe. ? ?You Must read complete instructions/literature along with all the possible adverse reactions/side effects for all the Medicines you  take and that have been prescribed to you. Take any new Medicines after you have completely understood and accpet all the possible adverse reactions/side effects.  ? ?  ? ? ?Discharge Medications  ? ?Allergies as of 05/02/2021   ? ?   Reactions  ? Hydrocodone Shortness Of Breath  ? Latuda [lurasidone Hcl] Anaphylaxis  ? Magnesium-containing Compounds Anaphylaxis  ? Tolerated Ensure  ? Prednisone Anaphylaxis, Swelling, Other (See Comments)  ? Tongue swelling, lip swelling, throat swelling, per pt   ? Tramadol Anaphylaxis, Swelling  ? Codeine Nausea And Vomiting  ? Trazodone Other (See Comments)  ?  paranoia  ? Topamax [topiramate] Other (See Comments)  ? Increases paranoia  ? Sulfa Antibiotics Itching  ? Tape Rash  ? ?  ? ?  ?Medication List  ?  ? ?STOP taking these medications   ? ?cyclobenzaprine 10 MG tablet ?Commonly known as: FLEXERIL ?  ?diazepam 5 MG tablet ?Commonly known as: VALIUM ?  ?diclofenac 75 MG EC tablet ?Commonly known as: VOLTAREN ?  ?gabapentin 600 MG tablet ?Commonly known as: NEURONTIN ?Replaced by: gabapentin 100 MG capsule ?  ?gabapentin 800 MG tablet ?Commonly known as: NEURONTIN ?  ?hydrOXYzine 25 MG tablet ?Commonly known as: ATARAX ?  ?methocarbamol 500 MG tablet ?Commonly known as: ROBAXIN ?  ? ?  ? ?TAKE these medications   ? ?albuterol (2.5 MG/3ML) 0.083% nebulizer solution ?Commonly known as: PROVENTIL ?Take 3 mLs (2.5 mg total) by nebulization every 6 (six) hours as needed for wheezing or shortness of breath. ?  ?albuterol 108 (90 Base) MCG/ACT inhaler ?Commonly known as: VENTOLIN HFA ?Inhale 2 puffs into the lungs every 6 (six) hours as needed for wheezing or shortness of breath. ?  ?amLODipine 10 MG tablet ?Commonly known as: NORVASC ?Take 20 mg by mouth daily. ?  ?Asenapine Maleate 10 MG Subl ?Place 1 tablet (10 mg total) under the tongue 2 (two) times daily. ?  ?atorvastatin 20 MG tablet ?Commonly known as: LIPITOR ?Take 1 tablet (20 mg total) by mouth at bedtime. ?  ?carvedilol  6.25 MG tablet ?Commonly known as: COREG ?Take 1 tablet (6.25 mg total) by mouth 2 (two) times daily with a meal. ?  ?colchicine 0.6 MG tablet ?Take 2 tablets by mouth at the onset of gout flare, may repeat 1 ta

## 2021-04-30 DIAGNOSIS — G834 Cauda equina syndrome: Secondary | ICD-10-CM | POA: Diagnosis not present

## 2021-04-30 LAB — GLUCOSE, CAPILLARY
Glucose-Capillary: 79 mg/dL (ref 70–99)
Glucose-Capillary: 100 mg/dL — ABNORMAL HIGH (ref 70–99)
Glucose-Capillary: 131 mg/dL — ABNORMAL HIGH (ref 70–99)
Glucose-Capillary: 113 mg/dL — ABNORMAL HIGH (ref 70–99)

## 2021-04-30 MED ORDER — OXYCODONE-ACETAMINOPHEN 5-325 MG PO TABS
1.0000 | ORAL_TABLET | Freq: Four times a day (QID) | ORAL | Status: DC | PRN
  Administered 2021-04-30: 1 via ORAL
  Administered 2021-05-01 – 2021-05-02 (×2): 2 via ORAL
  Filled 2021-04-30 (×3): qty 2

## 2021-04-30 MED ORDER — TORSEMIDE 20 MG PO TABS
20.0000 mg | ORAL_TABLET | Freq: Every day | ORAL | Status: DC
  Administered 2021-04-30 – 2021-05-02 (×3): 20 mg via ORAL
  Filled 2021-04-30 (×3): qty 1

## 2021-04-30 NOTE — Progress Notes (Signed)
?PROGRESS NOTE ? ? ? ?Tiffany [name]  WUJ:[id number] DOB: 1960/08/30 DOA: 04/24/2021 ?PCP: Lin Landsman, MD  ? ?Brief Narrative:  ?61 y.o. female with medical history significant of diabetes, CAD, chronic pain, COPD, CHF, substance use in remission, carotid artery disease, schizophrenia, PTSD, gout, anemia, GERD, hyperlipidemia, seizure, OSA, cardiac arrest, anoxic brain injury, CKD 3B presented with worsening abdominal pain, back pain and leg weakness.  On presentation, creatinine was 1.89. CT of the abdomen pelvis:increased endplate destruction of L3-L4 consistent with spondylodiscitis also with L3-4 anterior listhesis and severe worsening of spinal stenosis; No appreciable abscess; Stable adnexal cyst; Constipation and diverticulosis without diverticulitis noted.  MRI thoracic spine did not show any acute pathology.  MRI lumbar spine was concerning for cauda equina syndrome.    ? ? ? ?Subjective:  Patient in bed, appears comfortable, denies any headache, no fever, no chest pain or pressure, no shortness of breath , no abdominal pain. Improved back pain, no new focal weakness. ? ? ? ?Assessment & Plan: ?  ?Low back pain, radiating to R>>L Leg with chronic bilateral lower extremity weakness 3/5 - mild possible worsening lower extremity weakness very weak to begin with, MRI T and L-spine reviewed suspicion for severe spinal stenosis.  Seen by psychiatry and cleared for capacity, seen by neurosurgery.  Case discussed with neurosurgeon Dr. Christella Noa on 04/25/2021.  He does not think that patient is a good candidate for surgical intervention, also patient herself does not want surgery.  Continue medical management.  Pain in better control after medications were adjusted, continue PT OT and supportive care.  She has been discharged to SNF await bed. ?  ?AKI on CKD stage IIIb  - baseline creatinine around 1.6, hold Demadex, gentle IV fluids for 12 more hours on 04/28/2021, renal function is improved. ?  ?Anemia of chronic  disease  - Hemoglobin stable ? ?Chronic pain  - pain medications adjusted we will continue to monitor. ? ?Chronic diastolic CHF   - last echo in 12/2020 showed EF of 60 to 65% with grade 1 diastolic dysfunction. No signs of volume overload.  Strict input and output.  Daily weights.  Fluid restriction.  Continue Coreg.  Outpatient follow-up with cardiology ? ?CAD - Currently stable.  Continue Coreg and statin.  Outpatient follow-up with cardiology.  Currently not on antiplatelets ? ?Dyslipidemia.  Continue statin.   ? ?OSA -Continue home CPAP, she is noncompliant with it.  Counseled on compliance. ? ?Schizophrenia, PTSD, History of psychosis - Psychiatry evaluation pleated, she has the capacity to decide for herself, continue home medications ? ?History of anoxic brain injury, History of cardiac arrest - Palliative care consulted for goals of care discussion. ? ?Hypertension -Blood pressure intermittently elevated.  Continue amlodipine, Coreg ? ?Physical deconditioning  -We will get PT, OT evaluation >> SNF. ? ? ? ?DVT prophylaxis: Heparin ?Code Status: Full ?Family Communication: None at bedside ?Disposition Plan: ?Status is: Inpt ? ?Consultants: Neurosurgery, psychiatry, Pall. Care ? ?Procedures:  ? ?MRI T and L Spine - ? ?1. Study is degraded by moderate to severe motion artifact. Intraspinal detail is limited. 2. No acute findings or high-grade spinal stenosis demonstrated in the thoracic spine. There is mild multilevel spondylosis. 3. As demonstrated on recent CTs, progressive anterolisthesis at L4-5 with discal hyperintensity, endplate irregularity, edema and low level enhancement. Findings could be secondary to chronic discitis/osteomyelitis or instability. No paraspinal fluid collections are identified. There is resulting severe multifactorial spinal stenosis with significant lateral recess and foraminal narrowing bilaterally,  likely contributing to cauda equina syndrome. 4. Stable mild spinal stenosis and  mild lateral recess and left foraminal narrowing at L5-S1. 5. Transitional lumbosacral anatomy. ? ? ? ?Antimicrobials: None ? ? ?Objective: ?Vitals:  ? 04/29/21 2044 04/30/21 0008 04/30/21 0332 04/30/21 0716  ?BP: (!) 145/95 (!) 152/90 (!) 160/87 (!) 166/92  ?Pulse: 72 73 70 79  ?Resp:  18 18   ?Temp: 98.7 ?F (37.1 ?C) 97.7 ?F (36.5 ?C) (!) 97.5 ?F (36.4 ?C) 99.1 ?F (37.3 ?C)  ?TempSrc: Oral Oral Oral Oral  ?SpO2: 93% 90% 93% 91%  ? ? ?Intake/Output Summary (Last 24 hours) at 04/30/2021 0851 ?Last data filed at 04/30/2021 [phone] ?Gross per 24 hour  ?Intake 480 ml  ?Output 3000 ml  ?Net -2520 ml  ? ?There were no vitals filed for this visit. ? ?Examination: ? ?Awake Alert, No new F.N deficits, bilateral lower extremity strength at baseline appears to be 3/5 - left more weaker than right ?.AT,PERRAL ?Supple Neck, No JVD,   ?Symmetrical Chest wall movement, Good air movement bilaterally, CTAB ?RRR,No Gallops, Rubs or new Murmurs,  ?+ve B.Sounds, Abd Soft, No tenderness,   ?No Cyanosis, Clubbing or edema  ? ? ?Data Reviewed: I have personally reviewed following labs and imaging studies ?Recent Labs  ?Lab 04/24/21 ?0500 04/25/21 ?9937 04/26/21 ?1696 04/27/21 ?7893 04/28/21 ?0233  ?WBC 6.0 5.2 5.7 5.6 4.7  ?HGB 10.3* 10.7* 10.1* 10.0* 8.8*  ?HCT 34.1* 33.6* 30.8* 31.5* 29.0*  ?PLT 320 300 288 275 279  ?MCV 94.5 92.3 90.3 92.6 93.5  ?MCH 28.5 29.4 29.6 29.4 28.4  ?MCHC 30.2 31.8 32.8 31.7 30.3  ?RDW 15.2 14.8 14.6 14.9 14.8  ?LYMPHSABS  --   --  1.5 1.6 1.6  ?MONOABS  --   --  0.5 0.6 0.5  ?EOSABS  --   --  0.2 0.2 0.2  ?BASOSABS  --   --  0.0 0.0 0.0  ? ? ?Recent Labs  ?Lab 04/24/21 ?0651 04/25/21 ?8101 04/25/21 ?0645 04/26/21 ?7510 04/27/21 ?2585 04/28/21 ?0233  ?NA 137 138  --  137 138 140  ?K 5.6* 4.3  --  3.7 3.6 4.0  ?CL 106 106  --  100 102 104  ?CO2 21* 26  --  29 29 32  ?GLUCOSE 82 87  --  135* 117* 92  ?BUN 31* 24*  --  31* 31* 29*  ?CREATININE 1.89* 1.10*  --  1.83* 1.77* 1.61*  ?CALCIUM 9.7 9.3  --  9.1 9.0 9.2   ?AST 34 26  --  '30 24 23  '$ ?ALT 27 24  --  '23 20 23  '$ ?ALKPHOS 146* 142*  --  147* 125 117  ?BILITOT 0.3 0.4  --  0.6 0.2* 0.4  ?ALBUMIN 4.0 3.7  --  3.0* 3.0* 2.8*  ?MG  --   --   --  1.8 1.7 1.9  ?INR  --   --  1.0  --   --   --   ?BNP  --  48.0  --  26.4 11.9 14.2  ? ? ?  ?  ?Radiology Studies: ?No results found. ? ?Scheduled Meds: ? amLODipine  10 mg Oral Daily  ? asenapine  10 mg Sublingual BID  ? atorvastatin  20 mg Oral Daily  ? carvedilol  6.25 mg Oral BID WC  ? dicyclomine  10 mg Oral TID AC  ? DULoxetine  30 mg Oral BID  ? escitalopram  20 mg Oral q morning  ? fluticasone  furoate-vilanterol  1 puff Inhalation Daily  ? And  ? umeclidinium bromide  1 puff Inhalation Daily  ? gabapentin  200 mg Oral TID  ? heparin injection (subcutaneous)  5,000 Units Subcutaneous Q8H  ? insulin aspart  0-9 Units Subcutaneous TID WC  ? montelukast  10 mg Oral QHS  ? morphine  15 mg Oral Q12H  ? naLOXone (NARCAN)  injection  0.2 mg Intravenous Once  ? pantoprazole  40 mg Oral Daily  ? prazosin  2 mg Oral QHS  ? senna  2 tablet Oral QHS  ? sodium chloride flush  3 mL Intravenous Q12H  ? torsemide  20 mg Oral Daily  ? ?Continuous Infusions: ? ? ? ?Signature ? ?Lala Lund M.D on 04/30/2021 at 8:51 AM   -  To page go to [email or web address]  ? ?  ? ?

## 2021-04-30 NOTE — Plan of Care (Signed)

## 2021-04-30 NOTE — Progress Notes (Signed)
Called to room by NT due to patient being found in the floor after going to the room for the bed alarm.  Patient on floor alert and oriented x4 and denies injury.  She states she dropped her phone on he floor and was trying to get it and she slip out of bed.  Patient assisted back to bed by myself and the NT she was then examined and no injuries were noted.  Dr. Alcario Drought notified via text page and returned the call and no new orders were given at this time.  ?  ?  ?  ? ? ? ? 04/30/21 2104  ?What Happened  ?Was fall witnessed? No  ?Was patient injured? No  ?Patient found on floor  ?Found by Staff-comment ?([facility] NT)  ?Stated prior activity other (comment) ?(trying to get phone off the floor)  ?Follow Up  ?MD notified Dr. Alcario Drought  ?Time MD notified 2050  ?Family notified No - patient refusal  ?Additional tests No  ?Adult Fall Risk Assessment  ?Risk Factor Category (scoring not indicated) High fall risk per protocol (document High fall risk)  ?Patient Fall Risk Level High fall risk  ?Adult Fall Risk Interventions  ?Required Bundle Interventions *See Row Information* High fall risk - low, moderate, and high requirements implemented  ?Screening for Fall Injury Risk (To be completed on HIGH fall risk patients) - Assessing Need for Floor Mats  ?Risk For Fall Injury- Criteria for Floor Mats Previous fall this admission  ?Will Implement Floor Mats Yes  ?Vitals  ?Temp 99.1 ?F (37.3 ?C)  ?Temp Source Oral  ?BP 100/75  ?MAP (mmHg) 84  ?BP Location Right Arm  ?BP Method Automatic  ?Patient Position (if appropriate) Lying  ?Pulse Rate 72  ?Pulse Rate Source Monitor  ?Resp 19  ?Oxygen Therapy  ?SpO2 93 %  ?O2 Device Room Air  ?Pain Assessment  ?Pain Score 0  ?PCA/Epidural/Spinal Assessment  ?Respiratory Pattern Regular;Unlabored  ?Neurological  ?Neuro (WDL) X  ?Level of Consciousness Alert  ?Orientation Level Oriented to person;Oriented to place;Disoriented to time;Oriented to situation  ?Cognition Follows commands;Impulsive;Poor  attention/concentration;Poor judgement;Poor safety awareness;Memory impairment  ?Speech Clear  ?R Pupil Size (mm) 3  ?R Pupil Shape Round  ?R Pupil Reaction Brisk  ?L Pupil Size (mm) 3  ?L Pupil Shape Round  ?L Pupil Reaction Brisk  ?R Hand Grip Moderate  ?L Hand Grip Moderate  ?RUE Motor Response Purposeful movement  ?RUE Sensation Full sensation  ?RUE Motor Strength 4  ?LUE Motor Response Purposeful movement  ?LUE Sensation Decreased  ?LUE Motor Strength 4  ?RLE Motor Response Purposeful movement  ?RLE Sensation Full sensation  ?RLE Motor Strength 3  ?LLE Motor Response Responds to commands  ?LLE Sensation Decreased  ?LLE Motor Strength 2  ?Musculoskeletal  ?Musculoskeletal (WDL) X  ?Assistive Device None  ?Generalized Weakness Yes  ?Weight Bearing Restrictions No  ?Musculoskeletal Details  ?RLE Limited movement  ?LLE Injury/trauma  ?Integumentary  ?Integumentary (WDL) X  ?Skin Color Appropriate for ethnicity  ?Skin Condition Dry  ?Skin Integrity Abrasion  ?Abrasion Location Knee  ?Abrasion Location Orientation Right;Left  ? ?

## 2021-04-30 NOTE — Progress Notes (Signed)
[facility] auth received: ref #[id number], plan Josem Kaufmann #[id number], valid 4/15-4/19. Pt ready for dc per MD. SW spoke to [place] and Demetrius with [facility] who confirm they are prepared to admit pt to room 58-B provided pt is able to bring her own CPAP.  ? ?SW met with pt who reports her CPAP is at home and there is no one available to bring her machine to the SNF. Pt reports her dtr does not live locally and there is no one else available. Pt noted to be a confidential patient status. ? ?Updated Teena with [facility] who will work to order the CPAP today but it will not be delivered today. Anticipate dc Monday once CPAP delivered. MD updated.  ? ?[name], MSW, LCSW ?[phone] (coverage) ? ? ?

## 2021-04-30 NOTE — Progress Notes (Signed)
PT refused cpap 

## 2021-05-01 DIAGNOSIS — G834 Cauda equina syndrome: Secondary | ICD-10-CM | POA: Diagnosis not present

## 2021-05-01 LAB — GLUCOSE, CAPILLARY
Glucose-Capillary: 92 mg/dL (ref 70–99)
Glucose-Capillary: 90 mg/dL (ref 70–99)
Glucose-Capillary: 118 mg/dL — ABNORMAL HIGH (ref 70–99)
Glucose-Capillary: 115 mg/dL — ABNORMAL HIGH (ref 70–99)

## 2021-05-01 NOTE — Progress Notes (Signed)
?PROGRESS NOTE ? ? ? ?Tiffany [name]  PZW:[id number] DOB: 09/16/1960 DOA: 04/24/2021 ?PCP: Lin Landsman, MD  ? ?Brief Narrative:  ?61 y.o. female with medical history significant of diabetes, CAD, chronic pain, COPD, CHF, substance use in remission, carotid artery disease, schizophrenia, PTSD, gout, anemia, GERD, hyperlipidemia, seizure, OSA, cardiac arrest, anoxic brain injury, CKD 3B presented with worsening abdominal pain, back pain and leg weakness.  On presentation, creatinine was 1.89. CT of the abdomen pelvis:increased endplate destruction of L3-L4 consistent with spondylodiscitis also with L3-4 anterior listhesis and severe worsening of spinal stenosis; No appreciable abscess; Stable adnexal cyst; Constipation and diverticulosis without diverticulitis noted.  MRI thoracic spine did not show any acute pathology.  MRI lumbar spine was concerning for cauda equina syndrome.    ? ? ? ?Subjective: Seen in bed in no distress, slightly confused, back pain is improved but still hurting some, no chest pain or shortness of breath.  No headache ? ? ? ?Assessment & Plan: ?  ?Low back pain, radiating to R>>L Leg with chronic bilateral lower extremity weakness 3/5 - mild possible worsening lower extremity weakness very weak to begin with, MRI T and L-spine reviewed suspicion for severe spinal stenosis.  Seen by psychiatry and cleared for capacity, seen by neurosurgery.  Case discussed with neurosurgeon Dr. Christella Noa on 04/25/2021.  He does not think that patient is a good candidate for surgical intervention, also patient herself does not want surgery.  Continue medical management.  Pain in better control after medications were adjusted, continue PT OT and supportive care.  She has been discharged to SNF await bed. ?  ?AKI on CKD stage IIIb  - baseline creatinine around 1.6, hold Demadex, gentle IV fluids for 12 more hours on 04/28/2021, renal function is improved. ?  ?Anemia of chronic disease  - Hemoglobin stable ? ?Chronic  pain  - pain medications adjusted we will continue to monitor. ? ?Chronic diastolic CHF   - last echo in 12/2020 showed EF of 60 to 65% with grade 1 diastolic dysfunction. No signs of volume overload.  Strict input and output.  Daily weights.  Fluid restriction.  Continue Coreg.  Outpatient follow-up with cardiology ? ?CAD - Currently stable.  Continue Coreg and statin.  Outpatient follow-up with cardiology.  Currently not on antiplatelets ? ?Dyslipidemia.  Continue statin.   ? ?OSA -Continue home CPAP, she is noncompliant with it.  Counseled on compliance. ? ?Schizophrenia, PTSD, History of psychosis - Psychiatry evaluation pleated, she has the capacity to decide for herself, continue home medications, as needed Haldol for agitation. ? ?History of anoxic brain injury, History of cardiac arrest - Palliative care consulted for goals of care discussion. ? ?Hypertension -Blood pressure intermittently elevated.  Continue amlodipine, Coreg ? ?Physical deconditioning  -We will get PT, OT evaluation >> SNF. ? ? ? ?DVT prophylaxis: Heparin ?Code Status: Full ?Family Communication:  ? ?Called mother Bobby Rumpf [phone] on 05/01/2021 and message left at 9:26 AM ? ?Daughter [name] [phone] on 05/01/2021 at 9:28 AM message left ? ? ?Disposition Plan: ?Status is: Inpt ? ?Consultants: Neurosurgery, psychiatry, Pall. Care ? ?Procedures:  ? ?MRI T and L Spine - ? ?1. Study is degraded by moderate to severe motion artifact. Intraspinal detail is limited. 2. No acute findings or high-grade spinal stenosis demonstrated in the thoracic spine. There is mild multilevel spondylosis. 3. As demonstrated on recent CTs, progressive anterolisthesis at L4-5 with discal hyperintensity, endplate irregularity, edema and low level enhancement. Findings could be secondary to chronic  discitis/osteomyelitis or instability. No paraspinal fluid collections are identified. There is resulting severe multifactorial spinal stenosis with significant lateral  recess and foraminal narrowing bilaterally, likely contributing to cauda equina syndrome. 4. Stable mild spinal stenosis and mild lateral recess and left foraminal narrowing at L5-S1. 5. Transitional lumbosacral anatomy. ? ? ? ?Antimicrobials: None ? ? ?Objective: ?Vitals:  ? 04/30/21 2341 05/01/21 0151 05/01/21 6948 05/01/21 0718  ?BP: (!) 91/55  110/69 123/80  ?Pulse: 67 67 63 72  ?Resp:  '16 18 18  '$ ?Temp: 98.4 ?F (36.9 ?C)  98.4 ?F (36.9 ?C) 98 ?F (36.7 ?C)  ?TempSrc: Oral  Oral Oral  ?SpO2: 93% 94% 97% 99%  ? ? ?Intake/Output Summary (Last 24 hours) at 05/01/2021 0926 ?Last data filed at 04/30/2021 2152 ?Gross per 24 hour  ?Intake --  ?Output 625 ml  ?Net -625 ml  ? ?There were no vitals filed for this visit. ? ?Examination: ? ?Awake , mildly confused, No new F.N deficits, bilateral lower extremity strength at baseline appears to be 3/5 - left more weaker than right ?[place].AT,PERRAL ?Supple Neck, No JVD,   ?Symmetrical Chest wall movement, Good air movement bilaterally, CTAB ?RRR,No Gallops, Rubs or new Murmurs,  ?+ve B.Sounds, Abd Soft, No tenderness,   ?No Cyanosis, Clubbing or edema  ? ? ? ?Data Reviewed: I have personally reviewed following labs and imaging studies ?Recent Labs  ?Lab 04/25/21 ?5462 04/26/21 ?7035 04/27/21 ?0093 04/28/21 ?0233  ?WBC 5.2 5.7 5.6 4.7  ?HGB 10.7* 10.1* 10.0* 8.8*  ?HCT 33.6* 30.8* 31.5* 29.0*  ?PLT 300 288 275 279  ?MCV 92.3 90.3 92.6 93.5  ?MCH 29.4 29.6 29.4 28.4  ?MCHC 31.8 32.8 31.7 30.3  ?RDW 14.8 14.6 14.9 14.8  ?LYMPHSABS  --  1.5 1.6 1.6  ?MONOABS  --  0.5 0.6 0.5  ?EOSABS  --  0.2 0.2 0.2  ?BASOSABS  --  0.0 0.0 0.0  ? ? ?Recent Labs  ?Lab 04/25/21 ?8182 04/25/21 ?0645 04/26/21 ?9937 04/27/21 ?1696 04/28/21 ?0233  ?NA 138  --  137 138 140  ?K 4.3  --  3.7 3.6 4.0  ?CL 106  --  100 102 104  ?CO2 26  --  29 29 32  ?GLUCOSE 87  --  135* 117* 92  ?BUN 24*  --  31* 31* 29*  ?CREATININE 1.10*  --  1.83* 1.77* 1.61*  ?CALCIUM 9.3  --  9.1 9.0 9.2  ?AST 26  --  '30 24 23  '$ ?ALT 24  --   '23 20 23  '$ ?ALKPHOS 142*  --  147* 125 117  ?BILITOT 0.4  --  0.6 0.2* 0.4  ?ALBUMIN 3.7  --  3.0* 3.0* 2.8*  ?MG  --   --  1.8 1.7 1.9  ?INR  --  1.0  --   --   --   ?BNP 48.0  --  26.4 11.9 14.2  ? ? ?  ?  ?Radiology Studies: ?No results found. ? ?Scheduled Meds: ? amLODipine  10 mg Oral Daily  ? asenapine  10 mg Sublingual BID  ? atorvastatin  20 mg Oral Daily  ? carvedilol  6.25 mg Oral BID WC  ? dicyclomine  10 mg Oral TID AC  ? DULoxetine  30 mg Oral BID  ? escitalopram  20 mg Oral q morning  ? fluticasone furoate-vilanterol  1 puff Inhalation Daily  ? And  ? umeclidinium bromide  1 puff Inhalation Daily  ? gabapentin  200 mg Oral TID  ?  heparin injection (subcutaneous)  5,000 Units Subcutaneous Q8H  ? insulin aspart  0-9 Units Subcutaneous TID WC  ? montelukast  10 mg Oral QHS  ? morphine  15 mg Oral Q12H  ? naLOXone (NARCAN)  injection  0.2 mg Intravenous Once  ? pantoprazole  40 mg Oral Daily  ? prazosin  2 mg Oral QHS  ? senna  2 tablet Oral QHS  ? sodium chloride flush  3 mL Intravenous Q12H  ? torsemide  20 mg Oral Daily  ? ?Continuous Infusions: ? ? ? ?Signature ? ?Lala Lund M.D on 05/01/2021 at 9:26 AM   -  To page go to [email or web address]  ? ?  ? ?

## 2021-05-02 DIAGNOSIS — G894 Chronic pain syndrome: Secondary | ICD-10-CM | POA: Diagnosis not present

## 2021-05-02 DIAGNOSIS — I10 Essential (primary) hypertension: Secondary | ICD-10-CM | POA: Diagnosis not present

## 2021-05-02 DIAGNOSIS — M1000 Idiopathic gout, unspecified site: Secondary | ICD-10-CM | POA: Diagnosis not present

## 2021-05-02 DIAGNOSIS — I5022 Chronic systolic (congestive) heart failure: Secondary | ICD-10-CM | POA: Diagnosis not present

## 2021-05-02 DIAGNOSIS — R062 Wheezing: Secondary | ICD-10-CM | POA: Diagnosis not present

## 2021-05-02 DIAGNOSIS — G8929 Other chronic pain: Secondary | ICD-10-CM | POA: Diagnosis not present

## 2021-05-02 DIAGNOSIS — M545 Low back pain, unspecified: Secondary | ICD-10-CM | POA: Diagnosis not present

## 2021-05-02 DIAGNOSIS — Z7401 Bed confinement status: Secondary | ICD-10-CM | POA: Diagnosis not present

## 2021-05-02 DIAGNOSIS — K219 Gastro-esophageal reflux disease without esophagitis: Secondary | ICD-10-CM | POA: Diagnosis not present

## 2021-05-02 DIAGNOSIS — R293 Abnormal posture: Secondary | ICD-10-CM | POA: Diagnosis not present

## 2021-05-02 DIAGNOSIS — I499 Cardiac arrhythmia, unspecified: Secondary | ICD-10-CM | POA: Diagnosis not present

## 2021-05-02 DIAGNOSIS — Z79899 Other long term (current) drug therapy: Secondary | ICD-10-CM | POA: Diagnosis not present

## 2021-05-02 DIAGNOSIS — E785 Hyperlipidemia, unspecified: Secondary | ICD-10-CM | POA: Diagnosis not present

## 2021-05-02 DIAGNOSIS — G931 Anoxic brain damage, not elsewhere classified: Secondary | ICD-10-CM | POA: Diagnosis not present

## 2021-05-02 DIAGNOSIS — Z7951 Long term (current) use of inhaled steroids: Secondary | ICD-10-CM | POA: Diagnosis not present

## 2021-05-02 DIAGNOSIS — I6529 Occlusion and stenosis of unspecified carotid artery: Secondary | ICD-10-CM | POA: Diagnosis not present

## 2021-05-02 DIAGNOSIS — R0600 Dyspnea, unspecified: Secondary | ICD-10-CM | POA: Diagnosis not present

## 2021-05-02 DIAGNOSIS — N1832 Chronic kidney disease, stage 3b: Secondary | ICD-10-CM | POA: Diagnosis not present

## 2021-05-02 DIAGNOSIS — M6281 Muscle weakness (generalized): Secondary | ICD-10-CM | POA: Diagnosis not present

## 2021-05-02 DIAGNOSIS — N179 Acute kidney failure, unspecified: Secondary | ICD-10-CM | POA: Diagnosis not present

## 2021-05-02 DIAGNOSIS — R059 Cough, unspecified: Secondary | ICD-10-CM | POA: Diagnosis not present

## 2021-05-02 DIAGNOSIS — M5440 Lumbago with sciatica, unspecified side: Secondary | ICD-10-CM | POA: Diagnosis not present

## 2021-05-02 DIAGNOSIS — G834 Cauda equina syndrome: Secondary | ICD-10-CM | POA: Diagnosis not present

## 2021-05-02 DIAGNOSIS — Z743 Need for continuous supervision: Secondary | ICD-10-CM | POA: Diagnosis not present

## 2021-05-02 DIAGNOSIS — R0602 Shortness of breath: Secondary | ICD-10-CM | POA: Diagnosis not present

## 2021-05-02 DIAGNOSIS — R0902 Hypoxemia: Secondary | ICD-10-CM | POA: Diagnosis not present

## 2021-05-02 DIAGNOSIS — D649 Anemia, unspecified: Secondary | ICD-10-CM | POA: Diagnosis not present

## 2021-05-02 DIAGNOSIS — J449 Chronic obstructive pulmonary disease, unspecified: Secondary | ICD-10-CM | POA: Diagnosis not present

## 2021-05-02 DIAGNOSIS — R531 Weakness: Secondary | ICD-10-CM | POA: Diagnosis not present

## 2021-05-02 DIAGNOSIS — I129 Hypertensive chronic kidney disease with stage 1 through stage 4 chronic kidney disease, or unspecified chronic kidney disease: Secondary | ICD-10-CM | POA: Diagnosis not present

## 2021-05-02 DIAGNOSIS — E119 Type 2 diabetes mellitus without complications: Secondary | ICD-10-CM | POA: Diagnosis not present

## 2021-05-02 DIAGNOSIS — Z20822 Contact with and (suspected) exposure to covid-19: Secondary | ICD-10-CM | POA: Diagnosis not present

## 2021-05-02 LAB — GLUCOSE, CAPILLARY: Glucose-Capillary: 125 mg/dL — ABNORMAL HIGH (ref 70–99)

## 2021-05-02 NOTE — Progress Notes (Signed)
Patient refused to wear CPAP tonight  

## 2021-05-02 NOTE — Progress Notes (Signed)
Report given to Beatrice at [facility].  ?Era Bumpers, RN  ?

## 2021-05-02 NOTE — TOC Transition Note (Signed)
Transition of Care (TOC) - CM/SW Discharge Note ? ? ?Patient Details  ?Name: Tiffany [name] ?MRN: [id number] ?Date of Birth: 06/06/1960 ? ?Transition of Care [facility]) CM/SW Contact:  ?Geralynn Ochs, LCSW ?Phone Number: ?05/02/2021, 10:32 AM ? ? ?Clinical Narrative:   [name] confirmed with [facility] that they will have CPAP available for patient today. Patient can [facility] to [facility]. Transport scheduled with [facility] for next available. ? ?Nurse to call report to [phone], Room 158B. ? ? ? ?Final next level of care: [place] ?Barriers to Discharge: Barriers Resolved ? ? ?Patient Goals and CMS Choice ?Patient states their goals for this hospitalization and ongoing recovery are:: to get better ?CMS Medicare.gov Compare Post Acute Care list provided to:: Patient ?Choice offered to / list presented to : Patient ? ?Discharge Placement ?  ?           ?Patient chooses bed at:  [facility]) ?Patient to be transferred to facility by: [facility] ?Name of family member notified: Self ?Patient and family notified of of transfer: 05/02/21 ? ?Discharge Plan and Services ?  ?  ?Post Acute Care Choice: [place]          ?  ?  ?  ?  ?  ?  ?  ?  ?  ?  ? ?Social Determinants of Health (SDOH) Interventions ?  ? ? ?Readmission Risk Interventions ? ?  07/26/2020  ?  2:21 PM 02/23/2020  ?  1:16 PM  ?Readmission Risk Prevention Plan  ?Transportation Screening Complete Complete  ?Medication Review [employer] photographer) Complete Complete  ?PCP or Specialist appointment within 3-5 days of discharge Complete Complete  ?[place] or Home Care Consult Complete Complete  ?SW Recovery Care/Counseling Consult Complete Complete  ?Palliative Care Screening Not Applicable Complete  ?Skilled Nursing Facility Complete Complete  ? ? ? ? ? ?

## 2021-05-03 ENCOUNTER — Encounter (HOSPITAL_COMMUNITY): Payer: Self-pay

## 2021-05-03 ENCOUNTER — Emergency Department (HOSPITAL_COMMUNITY)
Admission: EM | Admit: 2021-05-03 | Discharge: 2021-05-03 | Disposition: A | Discharge status: Home / Self Care | Payer: Medicare Other | Attending: Emergency Medicine | Admitting: Emergency Medicine

## 2021-05-03 ENCOUNTER — Emergency Department (HOSPITAL_COMMUNITY): Payer: Medicare Other

## 2021-05-03 DIAGNOSIS — I499 Cardiac arrhythmia, unspecified: Secondary | ICD-10-CM | POA: Diagnosis not present

## 2021-05-03 DIAGNOSIS — Z7951 Long term (current) use of inhaled steroids: Secondary | ICD-10-CM | POA: Insufficient documentation

## 2021-05-03 DIAGNOSIS — Z743 Need for continuous supervision: Secondary | ICD-10-CM | POA: Diagnosis not present

## 2021-05-03 DIAGNOSIS — M5440 Lumbago with sciatica, unspecified side: Secondary | ICD-10-CM | POA: Diagnosis not present

## 2021-05-03 DIAGNOSIS — R0602 Shortness of breath: Secondary | ICD-10-CM

## 2021-05-03 DIAGNOSIS — R0902 Hypoxemia: Secondary | ICD-10-CM | POA: Diagnosis not present

## 2021-05-03 DIAGNOSIS — Z79899 Other long term (current) drug therapy: Secondary | ICD-10-CM | POA: Insufficient documentation

## 2021-05-03 DIAGNOSIS — M545 Low back pain, unspecified: Secondary | ICD-10-CM | POA: Diagnosis not present

## 2021-05-03 DIAGNOSIS — G8929 Other chronic pain: Secondary | ICD-10-CM

## 2021-05-03 DIAGNOSIS — N1832 Chronic kidney disease, stage 3b: Secondary | ICD-10-CM | POA: Insufficient documentation

## 2021-05-03 DIAGNOSIS — Z20822 Contact with and (suspected) exposure to covid-19: Secondary | ICD-10-CM | POA: Diagnosis not present

## 2021-05-03 DIAGNOSIS — R062 Wheezing: Secondary | ICD-10-CM | POA: Insufficient documentation

## 2021-05-03 DIAGNOSIS — R059 Cough, unspecified: Secondary | ICD-10-CM | POA: Diagnosis not present

## 2021-05-03 DIAGNOSIS — I129 Hypertensive chronic kidney disease with stage 1 through stage 4 chronic kidney disease, or unspecified chronic kidney disease: Secondary | ICD-10-CM | POA: Diagnosis not present

## 2021-05-03 DIAGNOSIS — R0600 Dyspnea, unspecified: Secondary | ICD-10-CM | POA: Diagnosis not present

## 2021-05-03 DIAGNOSIS — I10 Essential (primary) hypertension: Secondary | ICD-10-CM

## 2021-05-03 DIAGNOSIS — J449 Chronic obstructive pulmonary disease, unspecified: Secondary | ICD-10-CM | POA: Diagnosis not present

## 2021-05-03 DIAGNOSIS — Z8709 Personal history of other diseases of the respiratory system: Secondary | ICD-10-CM

## 2021-05-03 LAB — CBC WITH DIFFERENTIAL/PLATELET
Abs Immature Granulocytes: 0.03 10*3/uL (ref 0.00–0.07)
Basophils Absolute: 0.1 10*3/uL (ref 0.0–0.1)
Basophils Relative: 1 %
Eosinophils Absolute: 0.3 10*3/uL (ref 0.0–0.5)
Eosinophils Relative: 4 %
HCT: 32.9 % — ABNORMAL LOW (ref 36.0–46.0)
Hemoglobin: 10.5 g/dL — ABNORMAL LOW (ref 12.0–15.0)
Immature Granulocytes: 0 %
Lymphocytes Relative: 23 %
Lymphs Abs: 1.7 10*3/uL (ref 0.7–4.0)
MCH: 29 pg (ref 26.0–34.0)
MCHC: 31.9 g/dL (ref 30.0–36.0)
MCV: 90.9 fL (ref 80.0–100.0)
Monocytes Absolute: 0.6 10*3/uL (ref 0.1–1.0)
Monocytes Relative: 8 %
Neutro Abs: 4.6 10*3/uL (ref 1.7–7.7)
Neutrophils Relative %: 64 %
Platelets: 373 10*3/uL (ref 150–400)
RBC: 3.62 MIL/uL — ABNORMAL LOW (ref 3.87–5.11)
RDW: 14.2 % (ref 11.5–15.5)
WBC: 7.2 10*3/uL (ref 4.0–10.5)
nRBC: 0 % (ref 0.0–0.2)

## 2021-05-03 LAB — COMPREHENSIVE METABOLIC PANEL
ALT: 20 U/L (ref 0–44)
AST: 22 U/L (ref 15–41)
Albumin: 3.4 g/dL — ABNORMAL LOW (ref 3.5–5.0)
Alkaline Phosphatase: 133 U/L — ABNORMAL HIGH (ref 38–126)
Anion gap: 8 (ref 5–15)
BUN: 33 mg/dL — ABNORMAL HIGH (ref 8–23)
CO2: 34 mmol/L — ABNORMAL HIGH (ref 22–32)
Calcium: 10.1 mg/dL (ref 8.9–10.3)
Chloride: 94 mmol/L — ABNORMAL LOW (ref 98–111)
Creatinine, Ser: 1.7 mg/dL — ABNORMAL HIGH (ref 0.44–1.00)
GFR, Estimated: 34 mL/min — ABNORMAL LOW (ref >60)
Glucose, Bld: 106 mg/dL — ABNORMAL HIGH (ref 70–99)
Potassium: 4.1 mmol/L (ref 3.5–5.1)
Sodium: 136 mmol/L (ref 135–145)
Total Bilirubin: 0.2 mg/dL — ABNORMAL LOW (ref 0.3–1.2)
Total Protein: 6.9 g/dL (ref 6.5–8.1)

## 2021-05-03 LAB — TROPONIN I (HIGH SENSITIVITY)
Troponin I (High Sensitivity): 8 ng/L (ref <18)
Troponin I (High Sensitivity): 9 ng/L (ref <18)

## 2021-05-03 LAB — RESP PANEL BY RT-PCR (FLU A&B, COVID) ARPGX2
Influenza A by PCR: NEGATIVE
Influenza B by PCR: NEGATIVE
SARS Coronavirus 2 by RT PCR: NEGATIVE

## 2021-05-03 MED ORDER — OXYCODONE-ACETAMINOPHEN 5-325 MG PO TABS
1.0000 | ORAL_TABLET | Freq: Once | ORAL | Status: AC
  Administered 2021-05-03: 1 via ORAL
  Filled 2021-05-03: qty 1

## 2021-05-03 MED ORDER — ALBUTEROL SULFATE (2.5 MG/3ML) 0.083% IN NEBU
5.0000 mg | INHALATION_SOLUTION | Freq: Once | RESPIRATORY_TRACT | Status: AC
  Administered 2021-05-03: 5 mg via RESPIRATORY_TRACT
  Filled 2021-05-03: qty 6

## 2021-05-03 NOTE — ED Provider Notes (Addendum)
[facility] EMERGENCY DEPARTMENT Provider Note   CSN: [id number] Arrival date & time: 05/03/21  0231     History  Chief Complaint  Patient presents with   Shortness of Breath    Tiffany [name] is a 61 y.o. female.  Patient with hx copd, with non prod cough, mild sob. Symptoms acute onset in past 1-2 days, mild-mod, persistent. No chest pain or discomfort. No pleuritic pain. No sore throat, no trouble swallowing. No fever or chills. No abd pain or nvd. Normal appetite. No new leg pain or swelling. States has chronic back pain, no acute change - although states has not had her pain med or breathing treatment since in ED.    The history is provided by the patient, medical records and the EMS personnel.  Shortness of Breath Associated symptoms: cough   Associated symptoms: no abdominal pain, no chest pain, no fever, no headaches, no neck pain, no rash, no sore throat and no vomiting       Home Medications Prior to Admission medications   Medication Sig Start Date End Date Taking? Authorizing Provider  albuterol (PROVENTIL) (2.5 MG/3ML) 0.083% nebulizer solution Take 3 mLs (2.5 mg total) by nebulization every 6 (six) hours as needed for wheezing or shortness of breath. 12/16/20   Hoy Register, MD  albuterol (VENTOLIN HFA) 108 (90 Base) MCG/ACT inhaler Inhale 2 puffs into the lungs every 6 (six) hours as needed for wheezing or shortness of breath. 02/09/21   Hoy Register, MD  amLODipine (NORVASC) 10 MG tablet Take 20 mg by mouth daily.    [provider]  Asenapine Maleate 10 MG SUBL Place 1 tablet (10 mg total) under the tongue 2 (two) times daily. 04/06/21   Burnadette Pop, MD  atorvastatin (LIPITOR) 20 MG tablet Take 1 tablet (20 mg total) by mouth at bedtime. 01/14/21   Almon Hercules, MD  carvedilol (COREG) 6.25 MG tablet Take 1 tablet (6.25 mg total) by mouth 2 (two) times daily with a meal. 04/19/21   Hoy Register, MD  colchicine 0.6 MG tablet Take  2 tablets by mouth at the onset of gout flare, may repeat 1 tablet in 1 hour if symptoms persist Patient taking differently: Take 0.6 mg by mouth every morning. 01/14/21   Almon Hercules, MD  diclofenac Sodium (VOLTAREN) 1 % GEL Apply 4 g topically 4 (four) times daily as needed (pain). 04/19/21   Hoy Register, MD  dicyclomine (BENTYL) 10 MG capsule Take 1 capsule (10 mg total) by mouth 3 (three) times daily before meals. 12/24/20   Hoy Register, MD  DULoxetine (CYMBALTA) 30 MG capsule Take 30 mg by mouth daily. 03/22/21   [provider]  escitalopram (LEXAPRO) 20 MG tablet Take 20 mg by mouth every morning.    [provider]  Fluticasone-Umeclidin-Vilant (TRELEGY ELLIPTA) 100-62.5-25 MCG/ACT AEPB Inhale 1 puff into the lungs daily. 01/14/21   Almon Hercules, MD  gabapentin (NEURONTIN) 100 MG capsule Take 2 capsules (200 mg total) by mouth 3 (three) times daily. 04/29/21   Leroy Sea, MD  Iron, Ferrous Sulfate, 325 (65 Fe) MG TABS Take 325 mg by mouth daily. 01/14/21   Almon Hercules, MD  ketoconazole (NIZORAL) 2 % cream Apply 1 application. topically daily. Apply to finger nails 09/27/20   [provider]  lisdexamfetamine (VYVANSE) 10 MG capsule Take 1 capsule (10 mg total) by mouth daily at 12 noon. 04/06/21   Burnadette Pop, MD  montelukast (SINGULAIR) 10  MG tablet Take 1 tablet (10 mg total) by mouth at bedtime. 01/14/21   Almon Hercules, MD  morphine (MS CONTIN) 15 MG 12 hr tablet Take 1 tablet (15 mg total) by mouth every 12 (twelve) hours. 04/29/21   Leroy Sea, MD  Multiple Vitamin (MULTIVITAMIN WITH MINERALS) TABS tablet Take 1 tablet by mouth daily. 01/14/21   Almon Hercules, MD  omeprazole (PRILOSEC) 40 MG capsule TAKE 1 CAPSULE(40 MG) BY MOUTH DAILY Patient taking differently: Take 40 mg by mouth every morning. 10/23/20   Hoy Register, MD  oxybutynin (DITROPAN) 5 MG tablet TAKE 1 TABLET(5 MG) BY MOUTH TWICE DAILY Patient taking differently: Take 5  mg by mouth 2 (two) times daily. 10/23/20   Hoy Register, MD  oxyCODONE-acetaminophen (PERCOCET/ROXICET) 5-325 MG tablet Take 1 tablet by mouth every 6 (six) hours as needed for severe pain. 04/29/21   Leroy Sea, MD  polyethylene glycol (MIRALAX / GLYCOLAX) 17 g packet Take 17 g by mouth daily as needed for mild constipation. 04/29/21   Leroy Sea, MD  potassium chloride SA (KLOR-CON M) 20 MEQ tablet Take 1 tablet (20 mEq total) by mouth daily. Patient not taking: Reported on 04/24/2021 01/17/21 04/24/21  Almon Hercules, MD  prazosin (MINIPRESS) 2 MG capsule Take 1 capsule (2 mg total) by mouth at bedtime. 02/14/21   Hoy Register, MD  senna (SENOKOT) 8.6 MG TABS tablet Take 1 tablet (8.6 mg total) by mouth daily as needed for mild constipation. 04/29/21   Leroy Sea, MD  sodium chloride (OCEAN) 0.65 % SOLN nasal spray Place 2 sprays into both nostrils daily.    [provider]  terbinafine (LAMISIL AT) 1 % cream Apply 1 application topically 2 (two) times daily. Patient taking differently: Apply 1 application. topically 3 (three) times daily. Apply to feet 09/07/20   Hoy Register, MD  thiamine 100 MG tablet Take 1 tablet (100 mg total) by mouth daily. Patient not taking: Reported on 03/30/2021 02/27/20   Elease Etienne, MD  torsemide (DEMADEX) 20 MG tablet Take 1 tablet (20 mg total) by mouth daily. 04/19/21   Hoy Register, MD      Allergies    Hydrocodone, Kasandra Knudsen [lurasidone hcl], Magnesium-containing compounds, Prednisone, Tramadol, Codeine, Trazodone, Topamax [topiramate], Sulfa antibiotics, and Tape    Review of Systems   Review of Systems  Constitutional:  Negative for chills and fever.  HENT:  Negative for sore throat.   Eyes:  Negative for redness.  Respiratory:  Positive for cough and shortness of breath.   Cardiovascular:  Negative for chest pain, palpitations and leg swelling.  Gastrointestinal:  Negative for abdominal pain, nausea and vomiting.   Genitourinary:  Negative for dysuria and flank pain.  Musculoskeletal:  Positive for back pain. Negative for neck pain and neck stiffness.  Skin:  Negative for rash.  Neurological:  Negative for headaches.  Hematological:  Does not bruise/bleed easily.  Psychiatric/Behavioral:  Negative for confusion.    Physical Exam Updated Vital Signs BP (!) 127/115 (BP Location: Left Arm)   Pulse 78   Temp 98.2 F (36.8 C) (Oral)   Resp 18   SpO2 95%  Physical Exam Vitals and nursing note reviewed.  Constitutional:      Appearance: Normal appearance. [name] well-developed.  HENT:     Head: Atraumatic.     Nose: Nose normal.     Mouth/Throat:     Mouth: Mucous membranes are moist.     Pharynx: Oropharynx  is clear.  Eyes:     General: No scleral icterus.    Conjunctiva/sclera: Conjunctivae normal.     Pupils: Pupils are equal, round, and reactive to light.  Neck:     Trachea: No tracheal deviation.     Comments: No stiffness or rigidity. Trachea midline. No jvd. Cardiovascular:     Rate and Rhythm: Normal rate and regular rhythm.     Pulses: Normal pulses.     Heart sounds: Normal heart sounds. No murmur heard.   No friction rub. No gallop.  Pulmonary:     Effort: Pulmonary effort is normal. No respiratory distress.     Breath sounds: Wheezing present.     Comments: Mild wheezing.  Abdominal:     General: Bowel sounds are normal. There is no distension.     Palpations: Abdomen is soft.     Tenderness: There is no abdominal tenderness. There is no guarding.  Genitourinary:    Comments: No cva tenderness.  Musculoskeletal:        General: No swelling or tenderness.     Cervical back: Normal range of motion and neck supple. No rigidity. No muscular tenderness.     Right lower leg: No edema.     Left lower leg: No edema.     Comments: CTLS spine, non tender, aligned, no step off.   Skin:    General: Skin is warm and dry.     Findings: No rash.  Neurological:     Mental  Status: [name] alert.     Comments: Alert, speech normal. Motor/sens grossly intact bil.   Psychiatric:        Mood and Affect: Mood normal.    ED Results / Procedures / Treatments   Labs (all labs ordered are listed, but only abnormal results are displayed) Results for orders placed or performed during the hospital encounter of 05/03/21  Resp Panel by RT-PCR (Flu A&B, Covid) Nasopharyngeal Swab   Specimen: Nasopharyngeal Swab; Nasopharyngeal(NP) swabs in vial transport medium  Result Value Ref Range   SARS Coronavirus 2 by RT PCR NEGATIVE NEGATIVE   Influenza A by PCR NEGATIVE NEGATIVE   Influenza B by PCR NEGATIVE NEGATIVE  Comprehensive metabolic panel  Result Value Ref Range   Sodium 136 135 - 145 mmol/L   Potassium 4.1 3.5 - 5.1 mmol/L   Chloride 94 (L) 98 - 111 mmol/L   CO2 34 (H) 22 - 32 mmol/L   Glucose, Bld 106 (H) 70 - 99 mg/dL   BUN 33 (H) 8 - 23 mg/dL   Creatinine, Ser 6.96 (H) 0.44 - 1.00 mg/dL   Calcium 29.5 8.9 - 28.4 mg/dL   Total Protein 6.9 6.5 - 8.1 g/dL   Albumin 3.4 (L) 3.5 - 5.0 g/dL   AST 22 15 - 41 U/L   ALT 20 0 - 44 U/L   Alkaline Phosphatase 133 (H) 38 - 126 U/L   Total Bilirubin 0.2 (L) 0.3 - 1.2 mg/dL   GFR, Estimated 34 (L) >60 mL/min   Anion gap 8 5 - 15  CBC with Differential  Result Value Ref Range   WBC 7.2 4.0 - 10.5 K/uL   RBC 3.62 (L) 3.87 - 5.11 MIL/uL   Hemoglobin 10.5 (L) 12.0 - 15.0 g/dL   HCT 13.2 (L) 44.0 - 10.2 %   MCV 90.9 80.0 - 100.0 fL   MCH 29.0 26.0 - 34.0 pg   MCHC 31.9 30.0 - 36.0 g/dL   RDW 72.5 36.6 -  15.5 %   Platelets 373 150 - 400 K/uL   nRBC 0.0 0.0 - 0.2 %   Neutrophils Relative % 64 %   Neutro Abs 4.6 1.7 - 7.7 K/uL   Lymphocytes Relative 23 %   Lymphs Abs 1.7 0.7 - 4.0 K/uL   Monocytes Relative 8 %   Monocytes Absolute 0.6 0.1 - 1.0 K/uL   Eosinophils Relative 4 %   Eosinophils Absolute 0.3 0.0 - 0.5 K/uL   Basophils Relative 1 %   Basophils Absolute 0.1 0.0 - 0.1 K/uL   Immature Granulocytes 0 %    Abs Immature Granulocytes 0.03 0.00 - 0.07 K/uL  Troponin I (High Sensitivity)  Result Value Ref Range   Troponin I (High Sensitivity) 8 <18 ng/L  Troponin I (High Sensitivity)  Result Value Ref Range   Troponin I (High Sensitivity) 9 <18 ng/L   *Note: Due to a large number of results and/or encounters for the requested time period, some results have not been displayed. A complete set of results can be found in Results Review.       EKG EKG Interpretation  Date/Time:  Tuesday May 03 2021 02:34:01 EDT Ventricular Rate:  70 PR Interval:  154 QRS Duration: 80 QT Interval:  408 QTC Calculation: 440 R Axis:   12 Text Interpretation: Normal sinus rhythm No significant change since last tracing Confirmed by Cathren Laine ([id number]) on 05/03/2021 8:11:38 AM  Radiology DG Chest 2 View  Result Date: 05/03/2021 CLINICAL DATA:  Dyspnea EXAM: CHEST - 2 VIEW COMPARISON:  03/18/2021 FINDINGS: Lungs are clear. No pneumothorax or pleural effusion. Cardiac size within normal limits. Pulmonary vascularity is normal. Multiple healed left rib fractures are again identified. IMPRESSION: No radiographic evidence of acute cardiopulmonary disease Electronically Signed   By: Helyn Numbers M.D.   On: 05/03/2021 03:38    Procedures Procedures    Medications Ordered in ED Medications  oxyCODONE-acetaminophen (PERCOCET/ROXICET) 5-325 MG per tablet 1 tablet (has no administration in time range)  albuterol (PROVENTIL) (2.5 MG/3ML) 0.083% nebulizer solution 5 mg (has no administration in time range)    ED Course/ Medical Decision Making/ A&P                           Medical Decision Making Problems Addressed: Chronic bilateral low back pain with sciatica, sciatica laterality unspecified: chronic illness or injury Essential hypertension: chronic illness or injury with exacerbation, progression, or side effects of treatment that poses a threat to life or bodily functions History of COPD: chronic illness  or injury that poses a threat to life or bodily functions Shortness of breath: acute illness or injury with systemic symptoms that poses a threat to life or bodily functions Stage 3b chronic kidney disease (HCC): chronic illness or injury that poses a threat to life or bodily functions Wheezing: acute illness or injury  Amount and/or Complexity of Data Reviewed Independent Historian: EMS    Details: hx External Data Reviewed: labs and notes. Labs: ordered. Decision-making details documented in ED Course. Radiology: ordered and independent interpretation performed. Decision-making details documented in ED Course. ECG/medicine tests: ordered and independent interpretation performed. Decision-making details documented in ED Course.  Risk Prescription drug management. Decision regarding hospitalization.   Iv ns. Continuous pulse ox and cardiac monitoring. Labs ordered/sent. Imaging ordered. Based on pts symptoms, hx ckd, copd, etc. Considered admission disposition - will get labs and imaging, and reassess.   Reviewed nursing notes and prior charts for additional  history. External reports reviewed. Additional history from: EMS  Cardiac monitor: sinus rhythm, rate 80.  Labs reviewed/interpreted by me - wbc normal. Initial and delta trop normal - felt not c/w acs. Covid and flu neg.   Xrays reviewed/interpreted by me - no pna.  Albuterol neb tx. Percocet po.  Po fluids/food.  Recheck, pt breathing comfortably. O2 sats 97%. No chest pain.  Pt currently appears stable for d/c to her SNF.   Rec pcp f/u.  Return precautions provided.   At d/c, pt indicates she wants to speak to sw about 'changing insurance'. TOC was consulted, and did speak w pt - they indicate pt with hx signing herself out of two placements/facilities, b/c she did not like it there, and that pt also has home/home services-support, and they recommend d/c. They have provided pt additional  information.           Final Clinical Impression(s) / ED Diagnoses Final diagnoses:  None    Rx / DC Orders ED Discharge Orders     None         Cathren Laine, MD 05/03/21 [phone]

## 2021-05-03 NOTE — Discharge Instructions (Addendum)
It was our pleasure to provide your ER care today - we hope that you feel better. ? ?Use your albuterol treatment as need.  ? ?Take your pain medication as need.  ? ?Follow up with primary care doctor in one week.  ? ?Return to ER if worse, new symptoms, fevers, chest pain, increased trouble breathing, or other concern.  ?

## 2021-05-03 NOTE — ED Provider Triage Note (Signed)
Emergency Medicine Provider Triage Evaluation Note ? ?Tiffany [name] , a 61 y.o. female  was evaluated in triage.  Pt complains of dyspnea. Arrived @ [facility] today and upon arrival state she started to feel short of breath with chest tightness. Has had a dry cough. Denies alleviating/aggravating factors. ? ?Review of Systems  ?Positive: Dyspnea, chest pain, cough ?Negative: Hemoptysis, fever, vomiting.  ? ?Physical Exam  ?BP (!) 150/90 (BP Location: Right Arm)   Pulse 70   Temp 98.2 ?F (36.8 ?C) (Oral)   Resp 16   SpO2 95%  ?Gen:   Awake, no distress   ?Resp:  Normal effort  ?MSK:   Moves extremities without difficulty  ? ?Medical Decision Making  ?Medically screening exam initiated at 2:37 AM.  Appropriate orders placed.  APHRODITE HARPENAU was informed that the remainder of the evaluation will be completed by another provider, this initial triage assessment does not replace that evaluation, and the importance of remaining in the ED until their evaluation is complete. ? ?Dyspnea ?  ?[name], PA-C ?05/03/21 0250 ? ?

## 2021-05-03 NOTE — ED Notes (Signed)
Got patient on the monitor patient is resting with call bell in reach  ?

## 2021-05-03 NOTE — ED Triage Notes (Signed)
Pt comes via [facility] from [place] for SOB and headache that started tonight.  ?

## 2021-05-03 NOTE — ED Notes (Signed)
Social worker informed of patients request to speak to them. ?

## 2021-05-03 NOTE — Progress Notes (Signed)
[name] spoke with admissions, Theodoro Parma from [facility]. [name] was told that patient got into it with her roommate last night and stated that she is leaving the facility and is not coming back. Otila Kluver stated that patient told staff she did not like this place and has had enough. Patient also refused for staff to speak with patients mother or daughter. Patient accused staff of stealing her belongings and stated people are thieves at the facility. Otila Kluver stated she met with the DON this morning and they stated due to what patient had stated and acted last night that patient will not be able to return to [facility]. Patient left AMA. Patient was admitted into [facility] yesterday 05/02/21. Patient stayed at the facility less then 24 hours ?

## 2021-05-03 NOTE — Discharge Planning (Signed)
Pt from [facility]. ?

## 2021-05-03 NOTE — Discharge Planning (Signed)
RNCM spoke with pt at bedside as requested regarding switching her insurance company.  RNCM advised that she will need to research to find a plan that fits her needs as I am not qualified to assist in insurance selection.  Pt states that she was hoping to stay until it is sorted out.  RNCM advised that she may begin working on it, but will may have to complete outside of hospital if she is cleared for discharge prior to its competition.  Pt verbalized understanding and wishes to return home instead of [place].  ?

## 2021-05-03 NOTE — ED Notes (Signed)
Patient tolerating oral fluids  

## 2021-05-04 NOTE — Telephone Encounter (Signed)
Left another message for the pt to call back.  

## 2021-05-05 ENCOUNTER — Encounter (HOSPITAL_COMMUNITY): Payer: Self-pay | Admitting: Emergency Medicine

## 2021-05-05 ENCOUNTER — Emergency Department (HOSPITAL_COMMUNITY)
Admission: EM | Admit: 2021-05-05 | Discharge: 2021-05-05 | Disposition: A | Discharge status: Home / Self Care | Payer: Medicare Other | Attending: Emergency Medicine | Admitting: Emergency Medicine

## 2021-05-05 DIAGNOSIS — R404 Transient alteration of awareness: Secondary | ICD-10-CM | POA: Diagnosis not present

## 2021-05-05 DIAGNOSIS — G834 Cauda equina syndrome: Secondary | ICD-10-CM

## 2021-05-05 DIAGNOSIS — T887XXA Unspecified adverse effect of drug or medicament, initial encounter: Secondary | ICD-10-CM | POA: Diagnosis not present

## 2021-05-05 DIAGNOSIS — Z79899 Other long term (current) drug therapy: Secondary | ICD-10-CM | POA: Diagnosis not present

## 2021-05-05 DIAGNOSIS — T50904A Poisoning by unspecified drugs, medicaments and biological substances, undetermined, initial encounter: Secondary | ICD-10-CM | POA: Diagnosis not present

## 2021-05-05 DIAGNOSIS — Z743 Need for continuous supervision: Secondary | ICD-10-CM | POA: Diagnosis not present

## 2021-05-05 DIAGNOSIS — R4020 Unspecified coma: Secondary | ICD-10-CM | POA: Diagnosis not present

## 2021-05-05 LAB — SALICYLATE LEVEL: Salicylate Lvl: <7 mg/dL — ABNORMAL LOW (ref 7.0–30.0)

## 2021-05-05 LAB — CBC WITH DIFFERENTIAL/PLATELET
Abs Immature Granulocytes: 0.04 10*3/uL (ref 0.00–0.07)
Basophils Absolute: 0.1 10*3/uL (ref 0.0–0.1)
Basophils Relative: 1 %
Eosinophils Absolute: 0.2 10*3/uL (ref 0.0–0.5)
Eosinophils Relative: 4 %
HCT: 35.9 % — ABNORMAL LOW (ref 36.0–46.0)
Hemoglobin: 11.1 g/dL — ABNORMAL LOW (ref 12.0–15.0)
Immature Granulocytes: 1 %
Lymphocytes Relative: 29 %
Lymphs Abs: 1.7 10*3/uL (ref 0.7–4.0)
MCH: 29.1 pg (ref 26.0–34.0)
MCHC: 30.9 g/dL (ref 30.0–36.0)
MCV: 94.2 fL (ref 80.0–100.0)
Monocytes Absolute: 0.5 10*3/uL (ref 0.1–1.0)
Monocytes Relative: 9 %
Neutro Abs: 3.4 10*3/uL (ref 1.7–7.7)
Neutrophils Relative %: 56 %
Platelets: 373 10*3/uL (ref 150–400)
RBC: 3.81 MIL/uL — ABNORMAL LOW (ref 3.87–5.11)
RDW: 14.2 % (ref 11.5–15.5)
WBC: 5.9 10*3/uL (ref 4.0–10.5)
nRBC: 0 % (ref 0.0–0.2)

## 2021-05-05 LAB — URINALYSIS, ROUTINE W REFLEX MICROSCOPIC
Bilirubin Urine: NEGATIVE
Glucose, UA: NEGATIVE mg/dL
Hgb urine dipstick: NEGATIVE
Ketones, ur: NEGATIVE mg/dL
Leukocytes,Ua: NEGATIVE
Nitrite: NEGATIVE
Protein, ur: NEGATIVE mg/dL
Specific Gravity, Urine: 1.004 — ABNORMAL LOW (ref 1.005–1.030)
pH: 7 (ref 5.0–8.0)

## 2021-05-05 LAB — RAPID URINE DRUG SCREEN, HOSP PERFORMED
Amphetamines: NOT DETECTED
Barbiturates: NOT DETECTED
Benzodiazepines: POSITIVE — AB
Cocaine: NOT DETECTED
Opiates: POSITIVE — AB
Tetrahydrocannabinol: NOT DETECTED

## 2021-05-05 LAB — BASIC METABOLIC PANEL
Anion gap: 11 (ref 5–15)
BUN: 29 mg/dL — ABNORMAL HIGH (ref 8–23)
CO2: 26 mmol/L (ref 22–32)
Calcium: 9.6 mg/dL (ref 8.9–10.3)
Chloride: 99 mmol/L (ref 98–111)
Creatinine, Ser: 1.68 mg/dL — ABNORMAL HIGH (ref 0.44–1.00)
GFR, Estimated: 34 mL/min — ABNORMAL LOW (ref >60)
Glucose, Bld: 79 mg/dL (ref 70–99)
Potassium: 4.4 mmol/L (ref 3.5–5.1)
Sodium: 136 mmol/L (ref 135–145)

## 2021-05-05 LAB — ETHANOL: Alcohol, Ethyl (B): <10 mg/dL (ref <10)

## 2021-05-05 LAB — ACETAMINOPHEN LEVEL: Acetaminophen (Tylenol), Serum: 32 ug/mL — ABNORMAL HIGH (ref 10–30)

## 2021-05-05 NOTE — Progress Notes (Signed)
Transition of Care [facility]) - Emergency Department Mini Assessment ? ? ?Patient Details  ?Name: Tiffany [name] ?MRN: [id number] ?Date of Birth: [birth year] ? ?Transition of Care (TOC) CM/SW Contact:    ?Rodney Booze, LCSW ?Phone Number: ?05/05/2021, 10:50 PM ? ? ?Clinical Narrative: ? ? ? ?ED Mini Assessment: ?What brought you to the Emergency Department? : (P) Drug OverDose ? ?Barriers to Discharge: (P) No Barriers Identified ? ?Barrier interventions: (P) Pt. needs to be placed in long term care. Pt. contunes to have issue on-set substance use. ? ?Means of departure: (P) Car ? ?Interventions which prevented an admission or readmission:  (Resources to Long term care) ? ? ? ?Patient Contact and Communications ?  ?  ?  ? ,     ?  ?  ? ?Patient states their goals for this hospitalization and ongoing recovery are:: (P) Pt. would like to go to long term care. ?CMS Medicare.gov Compare Post Acute Care list provided to:: (P) Patient ?Choice offered to / list presented to : (P) Patient ? ?Admission diagnosis:  OD ?Patient Active Problem List  ? Diagnosis Date Noted  ? Cauda equina compression ([place]) 04/24/2021  ? Hematochezia   ? Hyperlipidemia 03/31/2021  ? GI bleed 03/30/2021  ? Acute GI bleeding 03/29/2021  ? Atypical chest pain 01/13/2021  ? GERD (gastroesophageal reflux disease)   ? Neuropathy 11/18/2020  ? Pain in both feet 11/18/2020  ? Localized edema 11/18/2020  ? Encounter for medication management 11/17/2020  ? Protein-calorie malnutrition, severe 05/15/2020  ? Malnutrition of moderate degree 05/13/2020  ? Periumbilical abdominal pain   ? Dysphagia   ? Nausea vomiting and diarrhea   ? Generalized abdominal pain   ? E. coli UTI 02/20/2020  ? Acute metabolic encephalopathy 00/92/3300  ? Psychosis ([place]) 02/20/2020  ? Schizophrenia, schizo-affective ([place]) 02/20/2020  ? Metabolic acidosis, normal anion gap (NAG) 02/20/2020  ? Altered mental status   ? Hypocalcemia   ? AKI (acute kidney injury) ([place]) 02/18/2020  ? Pelvic  mass in female 01/02/2020  ? Genital herpes 11/25/2019  ? Stage 2 skin ulcer of sacral region ([place]) 10/28/2019  ? Acute renal failure superimposed on stage 3b chronic kidney disease ([place]) 10/27/2019  ? Family discord 02/04/2019  ? Aggressive behavior   ? PTSD (post-traumatic stress disorder) 05/27/2018  ? Schizoaffective disorder, bipolar type ([place]) 04/05/2018  ? Closed displaced fracture of fifth metacarpal bone 03/21/2018  ? Difficulty with speech 01/24/2018  ? Encephalopathy 11/21/2017  ? Drug abuse ([place]) 11/21/2017  ? Frequent falls 10/11/2017  ? Binge eating disorder   ? Dependence on continuous supplemental oxygen 05/14/2017  ? Gout 04/11/2017  ? Hypomagnesemia   ? Stage 3b chronic kidney disease (CKD) ([place]) 12/15/2016  ? Carotid artery stenosis   ? Hypokalemia   ? Osteoarthritis 10/26/2016  ? Anoxic brain injury ([place]) 09/08/2016  ? Overactive bladder 06/07/2016  ? QT prolongation   ? OSA and COPD overlap syndrome ([place])   ? Arthritis   ? Essential hypertension 03/22/2016  ? Cocaine use disorder, severe, in sustained remission ([place]) 12/17/2015  ? History of drug abuse in remission ([place]) 11/28/2015  ? Hyponatremia 11/25/2015  ? Chronic diastolic congestive heart failure ([place])   ? Chronic respiratory failure with hypoxia ([place]) 06/22/2015  ? Tobacco use disorder 07/22/2014  ? COPD (chronic obstructive pulmonary disease) ([place]) 07/08/2014  ? Seizure ([place]) 01/04/2013  ? Chronic pain syndrome 06/18/2012  ? Dyslipidemia 04/24/2011  ? Anemia 04/24/2011  ? Diabetic neuropathy ([place]) 04/24/2011  ?  Morbid obesity ([place]) 10/18/2010  ? Type 2 diabetes mellitus ([place]) 10/18/2010  ? ?PCP:  Lin Landsman, MD ?Pharmacy:   ?[facility] [place], [address] ?[place] [address] ?Phone: [phone] Fax: [phone] ? ?[place], [place] [facility] [address] [address] ?Phone:  [phone] Fax: [phone] ?  ?

## 2021-05-05 NOTE — ED Provider Notes (Signed)
?[facility] ?Provider Note ? ? ?CSN: [id number] ?Arrival date & time: 05/05/21  1737 ? ?  ? ?History ? ?Chief Complaint  ?Patient presents with  ? Drug Overdose  ? ? ?Tiffany [name] is a 61 y.o. female. ? ?HPI ?Patient presents by EMS after being treated for suspected narcotic overdose, using oral medications.  The overdose is suspected to be nonintentional, and related to her severe back and leg discomfort.  After treatment with Narcan she became angry, but then recovered her usual status.  When I saw the patient her major complaint was pain in the perianal region.  She reports difficulty using both legs, but the left is more problematic than right.  She has ongoing cauda equina syndrome, recently evaluated by neurosurgery and felt to be nonsurgical due to multiple comorbidities.  [name] being managed expectantly.  She has frequently come to the emergency department for various conditions.  [name] with her mother in the emergency department, who is helpful and supportive.  At one point the patient stated that she felt she had something stuck in her rectum following her colonoscopy which was done in mid March 2023. ?  ? ?Home Medications ?Prior to Admission medications   ?Medication Sig Start Date End Date Taking? Authorizing Provider  ?albuterol (PROVENTIL) (2.5 MG/3ML) 0.083% nebulizer solution Take 3 mLs (2.5 mg total) by nebulization every 6 (six) hours as needed for wheezing or shortness of breath. 12/16/20   [name], MD  ?albuterol (VENTOLIN HFA) 108 (90 Base) MCG/ACT inhaler Inhale 2 puffs into the lungs every 6 (six) hours as needed for wheezing or shortness of breath. 02/09/21   [name], MD  ?amLODipine (NORVASC) 10 MG tablet Take 20 mg by mouth daily.    [provider]  ?Asenapine Maleate 10 MG SUBL Place 1 tablet (10 mg total) under the tongue 2 (two) times daily. 04/06/21   Shelly Coss, MD  ?atorvastatin (LIPITOR) 20 MG tablet Take 1  tablet (20 mg total) by mouth at bedtime. 01/14/21   Mercy Riding, MD  ?carvedilol (COREG) 6.25 MG tablet Take 1 tablet (6.25 mg total) by mouth 2 (two) times daily with a meal. 04/19/21   [name], MD  ?colchicine 0.6 MG tablet Take 2 tablets by mouth at the onset of gout flare, may repeat 1 tablet in 1 hour if symptoms persist ?Patient taking differently: Take 0.6 mg by mouth every morning. 01/14/21   Mercy Riding, MD  ?diclofenac Sodium (VOLTAREN) 1 % GEL Apply 4 g topically 4 (four) times daily as needed (pain). 04/19/21   [name], MD  ?dicyclomine (BENTYL) 10 MG capsule Take 1 capsule (10 mg total) by mouth 3 (three) times daily before meals. 12/24/20   [name], MD  ?DULoxetine (CYMBALTA) 30 MG capsule Take 30 mg by mouth daily. 03/22/21   [provider]  ?escitalopram (LEXAPRO) 20 MG tablet Take 20 mg by mouth every morning.    [provider]  ?Fluticasone-Umeclidin-Vilant (TRELEGY ELLIPTA) 100-62.5-25 MCG/ACT AEPB Inhale 1 puff into the lungs daily. 01/14/21   Mercy Riding, MD  ?gabapentin (NEURONTIN) 100 MG capsule Take 2 capsules (200 mg total) by mouth 3 (three) times daily. 04/29/21   Thurnell Lose, MD  ?Iron, Ferrous Sulfate, 325 (65 Fe) MG TABS Take 325 mg by mouth daily. 01/14/21   Mercy Riding, MD  ?ketoconazole (NIZORAL) 2 % cream Apply 1 application. topically daily. Apply to finger nails 09/27/20  [provider]  ?lisdexamfetamine (VYVANSE) 10 MG capsule Take 1 capsule (10 mg total) by mouth daily at 12 noon. 04/06/21   Shelly Coss, MD  ?montelukast (SINGULAIR) 10 MG tablet Take 1 tablet (10 mg total) by mouth at bedtime. 01/14/21   Mercy Riding, MD  ?morphine (MS CONTIN) 15 MG 12 hr tablet Take 1 tablet (15 mg total) by mouth every 12 (twelve) hours. 04/29/21   Thurnell Lose, MD  ?Multiple Vitamin (MULTIVITAMIN WITH MINERALS) TABS tablet Take 1 tablet by mouth daily. 01/14/21   Mercy Riding, MD  ?omeprazole (PRILOSEC) 40 MG capsule TAKE  1 CAPSULE(40 MG) BY MOUTH DAILY ?Patient taking differently: Take 40 mg by mouth every morning. 10/23/20   [name], MD  ?oxybutynin (DITROPAN) 5 MG tablet TAKE 1 TABLET(5 MG) BY MOUTH TWICE DAILY ?Patient taking differently: Take 5 mg by mouth 2 (two) times daily. 10/23/20   [name], MD  ?oxyCODONE-acetaminophen (PERCOCET/ROXICET) 5-325 MG tablet Take 1 tablet by mouth every 6 (six) hours as needed for severe pain. 04/29/21   Thurnell Lose, MD  ?polyethylene glycol (MIRALAX / GLYCOLAX) 17 g packet Take 17 g by mouth daily as needed for mild constipation. 04/29/21   Thurnell Lose, MD  ?potassium chloride SA (KLOR-CON M) 20 MEQ tablet Take 1 tablet (20 mEq total) by mouth daily. ?Patient not taking: Reported on 04/24/2021 01/17/21 04/24/21  Mercy Riding, MD  ?prazosin (MINIPRESS) 2 MG capsule Take 1 capsule (2 mg total) by mouth at bedtime. 02/14/21   [name], MD  ?senna (SENOKOT) 8.6 MG TABS tablet Take 1 tablet (8.6 mg total) by mouth daily as needed for mild constipation. 04/29/21   Thurnell Lose, MD  ?sodium chloride (OCEAN) 0.65 % SOLN nasal spray Place 2 sprays into both nostrils daily.    [provider]  ?terbinafine (LAMISIL AT) 1 % cream Apply 1 application topically 2 (two) times daily. ?Patient taking differently: Apply 1 application. topically 3 (three) times daily. Apply to feet 09/07/20   [name], MD  ?thiamine 100 MG tablet Take 1 tablet (100 mg total) by mouth daily. ?Patient not taking: Reported on 03/30/2021 02/27/20   Modena Jansky, MD  ?torsemide (DEMADEX) 20 MG tablet Take 1 tablet (20 mg total) by mouth daily. 04/19/21   [name], MD  ?   ? ?Allergies    ?Hydrocodone, Latuda [lurasidone hcl], Magnesium-containing compounds, Prednisone, Tramadol, Codeine, Trazodone, Topamax [topiramate], Sulfa antibiotics, and Tape   ? ?Review of Systems   ?Review of Systems ? ?Physical Exam ?Updated Vital Signs ?BP 127/72   Pulse 67   Temp 97.6 ?F (36.4 ?C)  (Oral)   Resp 11   SpO2 100%  ?Physical Exam ?Vitals and nursing note reviewed.  ?Constitutional:   ?   General: [name] not in acute distress. ?   Appearance: [name] well-developed. [name] not ill-appearing or toxic-appearing.  ?   Comments: Appears much older than stated age.  [name] under nourished.  ?HENT:  ?   Head: Normocephalic and atraumatic.  ?   Right Ear: External ear normal.  ?   Left Ear: External ear normal.  ?Eyes:  ?   Conjunctiva/sclera: Conjunctivae normal.  ?   Pupils: Pupils are equal, round, and reactive to light.  ?Neck:  ?   Trachea: Phonation normal.  ?Cardiovascular:  ?   Rate and Rhythm: Normal rate and regular rhythm.  ?   Heart sounds: Normal heart sounds.  ?  Pulmonary:  ?   Effort: Pulmonary effort is normal. No respiratory distress.  ?   Breath sounds: Normal breath sounds. No stridor.  ?Abdominal:  ?   General: There is no distension.  ?   Palpations: Abdomen is soft.  ?   Tenderness: There is no abdominal tenderness.  ?Genitourinary: ?   Comments: Anus has somewhat increased laxity as per normal.  There is stool in the rectal vault, no fecal impaction or mass.  The stool is brown in color. ?Musculoskeletal:     ?   General: Normal range of motion.  ?   Cervical back: Normal range of motion and neck supple.  ?   Comments: [name] able to move both legs independently.  ?Skin: ?   General: Skin is warm and dry.  ?Neurological:  ?   Mental Status: [name] alert and oriented to person, place, and time.  ?   Cranial Nerves: No cranial nerve deficit.  ?   Motor: No abnormal muscle tone.  ?   Coordination: Coordination normal.  ?   Comments: Intact sensation to the hands and feet bilaterally.  ?Psychiatric:     ?   Mood and Affect: Mood normal.     ?   Behavior: Behavior normal.  ? ? ?ED Results / Procedures / Treatments   ?Labs ?(all labs ordered are listed, but only abnormal results are displayed) ?Labs Reviewed  ?BASIC METABOLIC PANEL - Abnormal; Notable for the following components:  ?     Result Value  ? BUN 29 (*)   ? Creatinine, Ser 1.68 (*)   ? GFR, Estimated 34 (*)   ? All other components within normal limits  ?CBC WITH DIFFERENTIAL/PLATELET - Abnormal; Notable for the following components:  ? R

## 2021-05-05 NOTE — Care Management (Signed)
ED RNCM met with patient and mother at bedside to discuss transitional care planning.  Patient recently left SNF AMA.  Discuss HH services, patient is agreeable. Made patient aware that start of care may be delayed due to Health insurance  Referral was sent and accepted by [facility].  Start of Care will be next week. Patient will be updated on Monday 4/25..  ?

## 2021-05-05 NOTE — ED Provider Triage Note (Signed)
Emergency Medicine Provider Triage Evaluation Note ? ?Tiffany [name] , a 61 y.o. female  was evaluated in triage.  Pt brought to hospital via EMS due to mother finding patient unresponsive.  EMS found patient with agonal respirations.  1 mg of Narcan administered.  Patient became alert and angry.  Patient stated she took multiple doses of her pain medication for pain in her buttocks according to EMS.  At this time patient states that she took her normal medication and does not understand when she is at the hospital.  She denies any intent to harm herself or any hallucinations.  [name] alert and oriented but appears drowsy ? ?Review of Systems  ?Positive: Drowsiness ?Negative: Chest pain, shortness ? ?Physical Exam  ?BP 107/84 (BP Location: Right Arm)   Pulse 79   Temp 97.6 ?F (36.4 ?C) (Oral)   Resp 20   SpO2 97%  ?Gen:   Awake, no distress   ?Resp:  Normal effort  ?MSK:   Moves extremities without difficulty  ?Other:   ? ?Medical Decision Making  ?Medically screening exam initiated at 5:50 PM.  Appropriate orders placed.  Tiffany [name] was informed that the remainder of the evaluation will be completed by another provider, this initial triage assessment does not replace that evaluation, and the importance of remaining in the ED until their evaluation is complete. ? ? ?  ?Dorothyann Peng, PA-C ?05/05/21 1754 ? ?

## 2021-05-05 NOTE — ED Triage Notes (Signed)
Patient BIB [facility] from home after patient's mother called for the patient due to patient being unresponsive. On EMS arrival to patient's home, patient was unresponsive with agonal respirations. Patient given '1mg'$  naloxone and became alert and angry. Patient states she had taken multiple doses of her pain medication to treat pain in her buttocks. Denies SI, HI, and AVH. Patient is alert, oriented, and in no apparent distress at this time. ?

## 2021-05-05 NOTE — ED Notes (Signed)
Patient refusing to allow tech to do EKG, pt stated that she cannot be still or sit down for EKG at this time.  ?

## 2021-05-05 NOTE — Discharge Instructions (Signed)
Continue taking your usual medicines.  Try not to take too many narcotic pain relievers.  We are going to have home health come to your home to help treat you. ?

## 2021-05-09 DIAGNOSIS — I779 Disorder of arteries and arterioles, unspecified: Secondary | ICD-10-CM | POA: Diagnosis not present

## 2021-05-09 DIAGNOSIS — Z87891 Personal history of nicotine dependence: Secondary | ICD-10-CM | POA: Diagnosis not present

## 2021-05-09 DIAGNOSIS — E782 Mixed hyperlipidemia: Secondary | ICD-10-CM | POA: Diagnosis not present

## 2021-05-09 DIAGNOSIS — I10 Essential (primary) hypertension: Secondary | ICD-10-CM | POA: Diagnosis not present

## 2021-05-09 DIAGNOSIS — E119 Type 2 diabetes mellitus without complications: Secondary | ICD-10-CM | POA: Diagnosis not present

## 2021-05-09 DIAGNOSIS — I509 Heart failure, unspecified: Secondary | ICD-10-CM | POA: Diagnosis not present

## 2021-05-09 DIAGNOSIS — M79606 Pain in leg, unspecified: Secondary | ICD-10-CM | POA: Diagnosis not present

## 2021-05-09 DIAGNOSIS — D508 Other iron deficiency anemias: Secondary | ICD-10-CM | POA: Diagnosis not present

## 2021-05-09 DIAGNOSIS — R269 Unspecified abnormalities of gait and mobility: Secondary | ICD-10-CM | POA: Diagnosis not present

## 2021-05-09 DIAGNOSIS — N189 Chronic kidney disease, unspecified: Secondary | ICD-10-CM | POA: Diagnosis not present

## 2021-05-15 ENCOUNTER — Other Ambulatory Visit: Payer: Self-pay | Admitting: Family Medicine

## 2021-05-16 ENCOUNTER — Other Ambulatory Visit: Payer: Self-pay | Admitting: Family Medicine

## 2021-05-16 DIAGNOSIS — Z9181 History of falling: Secondary | ICD-10-CM | POA: Diagnosis not present

## 2021-05-16 DIAGNOSIS — M170 Bilateral primary osteoarthritis of knee: Secondary | ICD-10-CM | POA: Diagnosis not present

## 2021-05-16 NOTE — Telephone Encounter (Signed)
Will forward to provider  

## 2021-05-19 ENCOUNTER — Other Ambulatory Visit: Payer: Self-pay

## 2021-05-19 ENCOUNTER — Encounter (HOSPITAL_COMMUNITY): Payer: Self-pay

## 2021-05-19 ENCOUNTER — Inpatient Hospital Stay (HOSPITAL_COMMUNITY)
Admission: EM | Admit: 2021-05-19 | Discharge: 2021-06-02 | DRG: 392 | Disposition: A | Discharge status: Home Health | Payer: Medicare Other | Attending: Internal Medicine | Admitting: Internal Medicine

## 2021-05-19 DIAGNOSIS — A09 Infectious gastroenteritis and colitis, unspecified: Secondary | ICD-10-CM | POA: Diagnosis not present

## 2021-05-19 DIAGNOSIS — J9611 Chronic respiratory failure with hypoxia: Secondary | ICD-10-CM | POA: Diagnosis not present

## 2021-05-19 DIAGNOSIS — D631 Anemia in chronic kidney disease: Secondary | ICD-10-CM | POA: Diagnosis present

## 2021-05-19 DIAGNOSIS — Z888 Allergy status to other drugs, medicaments and biological substances status: Secondary | ICD-10-CM

## 2021-05-19 DIAGNOSIS — G894 Chronic pain syndrome: Secondary | ICD-10-CM | POA: Diagnosis not present

## 2021-05-19 DIAGNOSIS — F419 Anxiety disorder, unspecified: Secondary | ICD-10-CM

## 2021-05-19 DIAGNOSIS — F209 Schizophrenia, unspecified: Secondary | ICD-10-CM

## 2021-05-19 DIAGNOSIS — J439 Emphysema, unspecified: Secondary | ICD-10-CM | POA: Diagnosis present

## 2021-05-19 DIAGNOSIS — Z79899 Other long term (current) drug therapy: Secondary | ICD-10-CM

## 2021-05-19 DIAGNOSIS — R197 Diarrhea, unspecified: Secondary | ICD-10-CM | POA: Diagnosis not present

## 2021-05-19 DIAGNOSIS — K529 Noninfective gastroenteritis and colitis, unspecified: Principal | ICD-10-CM | POA: Diagnosis present

## 2021-05-19 DIAGNOSIS — F32A Depression, unspecified: Secondary | ICD-10-CM | POA: Diagnosis not present

## 2021-05-19 DIAGNOSIS — F203 Undifferentiated schizophrenia: Secondary | ICD-10-CM | POA: Diagnosis not present

## 2021-05-19 DIAGNOSIS — J302 Other seasonal allergic rhinitis: Secondary | ICD-10-CM | POA: Diagnosis present

## 2021-05-19 DIAGNOSIS — Z7951 Long term (current) use of inhaled steroids: Secondary | ICD-10-CM

## 2021-05-19 DIAGNOSIS — Z8673 Personal history of transient ischemic attack (TIA), and cerebral infarction without residual deficits: Secondary | ICD-10-CM

## 2021-05-19 DIAGNOSIS — G4733 Obstructive sleep apnea (adult) (pediatric): Secondary | ICD-10-CM | POA: Diagnosis present

## 2021-05-19 DIAGNOSIS — Z8619 Personal history of other infectious and parasitic diseases: Secondary | ICD-10-CM

## 2021-05-19 DIAGNOSIS — F4312 Post-traumatic stress disorder, chronic: Secondary | ICD-10-CM | POA: Diagnosis present

## 2021-05-19 DIAGNOSIS — J9612 Chronic respiratory failure with hypercapnia: Secondary | ICD-10-CM | POA: Diagnosis not present

## 2021-05-19 DIAGNOSIS — Z87891 Personal history of nicotine dependence: Secondary | ICD-10-CM | POA: Diagnosis not present

## 2021-05-19 DIAGNOSIS — E1140 Type 2 diabetes mellitus with diabetic neuropathy, unspecified: Secondary | ICD-10-CM | POA: Diagnosis not present

## 2021-05-19 DIAGNOSIS — R5381 Other malaise: Secondary | ICD-10-CM | POA: Diagnosis not present

## 2021-05-19 DIAGNOSIS — R600 Localized edema: Secondary | ICD-10-CM

## 2021-05-19 DIAGNOSIS — Z818 Family history of other mental and behavioral disorders: Secondary | ICD-10-CM

## 2021-05-19 DIAGNOSIS — Z8674 Personal history of sudden cardiac arrest: Secondary | ICD-10-CM | POA: Diagnosis not present

## 2021-05-19 DIAGNOSIS — Z743 Need for continuous supervision: Secondary | ICD-10-CM | POA: Diagnosis not present

## 2021-05-19 DIAGNOSIS — N1832 Chronic kidney disease, stage 3b: Secondary | ICD-10-CM | POA: Diagnosis not present

## 2021-05-19 DIAGNOSIS — E785 Hyperlipidemia, unspecified: Secondary | ICD-10-CM | POA: Diagnosis present

## 2021-05-19 DIAGNOSIS — I130 Hypertensive heart and chronic kidney disease with heart failure and stage 1 through stage 4 chronic kidney disease, or unspecified chronic kidney disease: Secondary | ICD-10-CM | POA: Diagnosis present

## 2021-05-19 DIAGNOSIS — F250 Schizoaffective disorder, bipolar type: Secondary | ICD-10-CM | POA: Diagnosis present

## 2021-05-19 DIAGNOSIS — K219 Gastro-esophageal reflux disease without esophagitis: Secondary | ICD-10-CM | POA: Diagnosis present

## 2021-05-19 DIAGNOSIS — K591 Functional diarrhea: Secondary | ICD-10-CM | POA: Diagnosis not present

## 2021-05-19 DIAGNOSIS — F2081 Schizophreniform disorder: Secondary | ICD-10-CM | POA: Diagnosis not present

## 2021-05-19 DIAGNOSIS — E860 Dehydration: Secondary | ICD-10-CM | POA: Diagnosis present

## 2021-05-19 DIAGNOSIS — I252 Old myocardial infarction: Secondary | ICD-10-CM

## 2021-05-19 DIAGNOSIS — Z8249 Family history of ischemic heart disease and other diseases of the circulatory system: Secondary | ICD-10-CM

## 2021-05-19 DIAGNOSIS — M109 Gout, unspecified: Secondary | ICD-10-CM | POA: Diagnosis present

## 2021-05-19 DIAGNOSIS — I5032 Chronic diastolic (congestive) heart failure: Secondary | ICD-10-CM | POA: Diagnosis not present

## 2021-05-19 DIAGNOSIS — F200 Paranoid schizophrenia: Secondary | ICD-10-CM

## 2021-05-19 DIAGNOSIS — R252 Cramp and spasm: Secondary | ICD-10-CM | POA: Diagnosis present

## 2021-05-19 DIAGNOSIS — I10 Essential (primary) hypertension: Secondary | ICD-10-CM | POA: Diagnosis present

## 2021-05-19 DIAGNOSIS — E1122 Type 2 diabetes mellitus with diabetic chronic kidney disease: Secondary | ICD-10-CM | POA: Diagnosis present

## 2021-05-19 DIAGNOSIS — Z79891 Long term (current) use of opiate analgesic: Secondary | ICD-10-CM

## 2021-05-19 DIAGNOSIS — Z9181 History of falling: Secondary | ICD-10-CM

## 2021-05-19 DIAGNOSIS — F1421 Cocaine dependence, in remission: Secondary | ICD-10-CM | POA: Diagnosis present

## 2021-05-19 DIAGNOSIS — R109 Unspecified abdominal pain: Secondary | ICD-10-CM | POA: Diagnosis not present

## 2021-05-19 DIAGNOSIS — Z90710 Acquired absence of both cervix and uterus: Secondary | ICD-10-CM

## 2021-05-19 DIAGNOSIS — N184 Chronic kidney disease, stage 4 (severe): Secondary | ICD-10-CM

## 2021-05-19 DIAGNOSIS — Z885 Allergy status to narcotic agent status: Secondary | ICD-10-CM

## 2021-05-19 DIAGNOSIS — I2510 Atherosclerotic heart disease of native coronary artery without angina pectoris: Secondary | ICD-10-CM | POA: Diagnosis not present

## 2021-05-19 DIAGNOSIS — Z9109 Other allergy status, other than to drugs and biological substances: Secondary | ICD-10-CM

## 2021-05-19 DIAGNOSIS — M159 Polyosteoarthritis, unspecified: Secondary | ICD-10-CM | POA: Diagnosis present

## 2021-05-19 DIAGNOSIS — R531 Weakness: Secondary | ICD-10-CM | POA: Diagnosis not present

## 2021-05-19 DIAGNOSIS — Z882 Allergy status to sulfonamides status: Secondary | ICD-10-CM

## 2021-05-19 LAB — CBC WITH DIFFERENTIAL/PLATELET
Abs Immature Granulocytes: 0.2 10*3/uL — ABNORMAL HIGH (ref 0.00–0.07)
Basophils Absolute: 0.1 10*3/uL (ref 0.0–0.1)
Basophils Relative: 1 %
Eosinophils Absolute: 0.2 10*3/uL (ref 0.0–0.5)
Eosinophils Relative: 2 %
HCT: 32.6 % — ABNORMAL LOW (ref 36.0–46.0)
Hemoglobin: 10.3 g/dL — ABNORMAL LOW (ref 12.0–15.0)
Immature Granulocytes: 2 %
Lymphocytes Relative: 21 %
Lymphs Abs: 2.5 10*3/uL (ref 0.7–4.0)
MCH: 28.6 pg (ref 26.0–34.0)
MCHC: 31.6 g/dL (ref 30.0–36.0)
MCV: 90.6 fL (ref 80.0–100.0)
Monocytes Absolute: 0.8 10*3/uL (ref 0.1–1.0)
Monocytes Relative: 7 %
Neutro Abs: 8 10*3/uL — ABNORMAL HIGH (ref 1.7–7.7)
Neutrophils Relative %: 67 %
Platelets: 444 10*3/uL — ABNORMAL HIGH (ref 150–400)
RBC: 3.6 MIL/uL — ABNORMAL LOW (ref 3.87–5.11)
RDW: 14.1 % (ref 11.5–15.5)
WBC: 11.8 10*3/uL — ABNORMAL HIGH (ref 4.0–10.5)
nRBC: 0 % (ref 0.0–0.2)

## 2021-05-19 LAB — COMPREHENSIVE METABOLIC PANEL
ALT: 24 U/L (ref 0–44)
AST: 28 U/L (ref 15–41)
Albumin: 3.2 g/dL — ABNORMAL LOW (ref 3.5–5.0)
Alkaline Phosphatase: 160 U/L — ABNORMAL HIGH (ref 38–126)
Anion gap: 6 (ref 5–15)
BUN: 28 mg/dL — ABNORMAL HIGH (ref 8–23)
CO2: 26 mmol/L (ref 22–32)
Calcium: 9.2 mg/dL (ref 8.9–10.3)
Chloride: 106 mmol/L (ref 98–111)
Creatinine, Ser: 1.72 mg/dL — ABNORMAL HIGH (ref 0.44–1.00)
GFR, Estimated: 33 mL/min — ABNORMAL LOW (ref >60)
Glucose, Bld: 75 mg/dL (ref 70–99)
Potassium: 3.8 mmol/L (ref 3.5–5.1)
Sodium: 138 mmol/L (ref 135–145)
Total Bilirubin: 0.4 mg/dL (ref 0.3–1.2)
Total Protein: 6.5 g/dL (ref 6.5–8.1)

## 2021-05-19 LAB — LIPASE, BLOOD: Lipase: 40 U/L (ref 11–51)

## 2021-05-19 LAB — POC OCCULT BLOOD, ED: Fecal Occult Bld: POSITIVE — AB

## 2021-05-19 LAB — MAGNESIUM: Magnesium: 1.8 mg/dL (ref 1.7–2.4)

## 2021-05-19 MED ORDER — LACTATED RINGERS IV BOLUS
1000.0000 mL | Freq: Once | INTRAVENOUS | Status: AC
Start: 2021-05-19 — End: 2021-05-20
  Administered 2021-05-19: 1000 mL via INTRAVENOUS

## 2021-05-19 NOTE — ED Triage Notes (Signed)
BIB EMS from home for generalized weakness and diarrhea started a week ago and generalized pain, VSS ?

## 2021-05-19 NOTE — ED Notes (Signed)
Patient has had multiple stools, initially brown/red, then green and watery. Linens and chux pads changed 3 times due to continuous incontinence of stool. Pt's skin is very excoriated. Pt became verbally aggressive toward myself and staff member, cursing at [name] when wiping the skin clean. Pt then started pulling at IV in L upper arm, stating it hurts. Blood return remains and IV flushed, pt educated regarding IV and that it likely hurt due to laying on it when rolling to change bedding. Dr Ralene Bathe came to bedside and also educated pt regarding IV and pt seemed to calm down a bit after. ?

## 2021-05-19 NOTE — ED Provider Notes (Signed)
?[facility] ?Provider Note ? ? ?CSN: [id number] ?Arrival date & time: 05/19/21  2135 ? ?  ? ?History ? ?Chief Complaint  ?Patient presents with  ? Weakness  ? Generalized Body Aches  ? Diarrhea  ? ? ?Tiffany [name] is a 61 y.o. female. ? ?The history is provided by the patient and medical records.  ?Weakness ?Associated symptoms: diarrhea   ?Diarrhea ?Tiffany [name] is a 61 y.o. female who presents to the Emergency Department complaining of diarrhea and weakness.  She presents to the emergency department for evaluation of 1 week of generalized weakness, body aches and profuse watery diarrhea.  She reports too numerous to count bowel movements daily.  She had 1 episode of vomiting 2 days ago.  She has nausea and food aversion and poor oral intake.  She describes mild generalized abdominal discomfort.  No recent antibiotics.  She lives alone and ambulates with a wheelchair.. ?  ? ?Home Medications ?Prior to Admission medications   ?Medication Sig Start Date End Date Taking? Authorizing Provider  ?albuterol (PROVENTIL) (2.5 MG/3ML) 0.083% nebulizer solution Take 3 mLs (2.5 mg total) by nebulization every 6 (six) hours as needed for wheezing or shortness of breath. 12/16/20   [name], MD  ?albuterol (VENTOLIN HFA) 108 (90 Base) MCG/ACT inhaler Inhale 2 puffs into the lungs every 6 (six) hours as needed for wheezing or shortness of breath. 02/09/21   [name], MD  ?amLODipine (NORVASC) 10 MG tablet Take 20 mg by mouth daily.    [provider]  ?Asenapine Maleate 10 MG SUBL Place 1 tablet (10 mg total) under the tongue 2 (two) times daily. 04/06/21   Shelly Coss, MD  ?atorvastatin (LIPITOR) 20 MG tablet Take 1 tablet (20 mg total) by mouth at bedtime. 01/14/21   Mercy Riding, MD  ?carvedilol (COREG) 6.25 MG tablet Take 1 tablet (6.25 mg total) by mouth 2 (two) times daily with a meal. 04/19/21   [name], MD  ?colchicine 0.6 MG tablet Take 2 tablets by  mouth at the onset of gout flare, may repeat 1 tablet in 1 hour if symptoms persist ?Patient taking differently: Take 0.6 mg by mouth every morning. 01/14/21   Mercy Riding, MD  ?diclofenac Sodium (VOLTAREN) 1 % GEL Apply 4 g topically 4 (four) times daily as needed (pain). 04/19/21   [name], MD  ?dicyclomine (BENTYL) 10 MG capsule Take 1 capsule (10 mg total) by mouth 3 (three) times daily before meals. 12/24/20   [name], MD  ?DULoxetine (CYMBALTA) 30 MG capsule Take 30 mg by mouth daily. 03/22/21   [provider]  ?escitalopram (LEXAPRO) 20 MG tablet Take 20 mg by mouth every morning.    [provider]  ?Fluticasone-Umeclidin-Vilant (TRELEGY ELLIPTA) 100-62.5-25 MCG/ACT AEPB Inhale 1 puff into the lungs daily. 01/14/21   Mercy Riding, MD  ?gabapentin (NEURONTIN) 100 MG capsule Take 2 capsules (200 mg total) by mouth 3 (three) times daily. 04/29/21   Thurnell Lose, MD  ?Iron, Ferrous Sulfate, 325 (65 Fe) MG TABS Take 325 mg by mouth daily. 01/14/21   Mercy Riding, MD  ?ketoconazole (NIZORAL) 2 % cream Apply 1 application. topically daily. Apply to finger nails 09/27/20   [provider]  ?lisdexamfetamine (VYVANSE) 10 MG capsule Take 1 capsule (10 mg total) by mouth daily at 12 noon. 04/06/21   Shelly Coss, MD  ?montelukast (SINGULAIR) 10 MG tablet Take 1 tablet (10 mg total) by mouth  at bedtime. 01/14/21   Mercy Riding, MD  ?morphine (MS CONTIN) 15 MG 12 hr tablet Take 1 tablet (15 mg total) by mouth every 12 (twelve) hours. 04/29/21   Thurnell Lose, MD  ?Multiple Vitamin (MULTIVITAMIN WITH MINERALS) TABS tablet Take 1 tablet by mouth daily. 01/14/21   Mercy Riding, MD  ?omeprazole (PRILOSEC) 40 MG capsule TAKE 1 CAPSULE(40 MG) BY MOUTH DAILY ?Patient taking differently: Take 40 mg by mouth every morning. 10/23/20   [name], MD  ?oxybutynin (DITROPAN) 5 MG tablet TAKE 1 TABLET(5 MG) BY MOUTH TWICE DAILY ?Patient taking differently: Take 5 mg by mouth 2  (two) times daily. 10/23/20   [name], MD  ?oxyCODONE-acetaminophen (PERCOCET/ROXICET) 5-325 MG tablet Take 1 tablet by mouth every 6 (six) hours as needed for severe pain. 04/29/21   Thurnell Lose, MD  ?polyethylene glycol (MIRALAX / GLYCOLAX) 17 g packet Take 17 g by mouth daily as needed for mild constipation. 04/29/21   Thurnell Lose, MD  ?potassium chloride SA (KLOR-CON M) 20 MEQ tablet Take 1 tablet (20 mEq total) by mouth daily. ?Patient not taking: Reported on 04/24/2021 01/17/21 04/24/21  Mercy Riding, MD  ?prazosin (MINIPRESS) 2 MG capsule TAKE 1 CAPSULE(2 MG) BY MOUTH AT BEDTIME 05/16/21   [name], MD  ?senna (SENOKOT) 8.6 MG TABS tablet Take 1 tablet (8.6 mg total) by mouth daily as needed for mild constipation. 04/29/21   Thurnell Lose, MD  ?sodium chloride (OCEAN) 0.65 % SOLN nasal spray Place 2 sprays into both nostrils daily.    [provider]  ?terbinafine (LAMISIL AT) 1 % cream Apply 1 application topically 2 (two) times daily. ?Patient taking differently: Apply 1 application. topically 3 (three) times daily. Apply to feet 09/07/20   [name], MD  ?thiamine 100 MG tablet Take 1 tablet (100 mg total) by mouth daily. ?Patient not taking: Reported on 03/30/2021 02/27/20   Modena Jansky, MD  ?torsemide (DEMADEX) 20 MG tablet Take 1 tablet (20 mg total) by mouth daily. 04/19/21   [name], MD  ?   ? ?Allergies    ?Hydrocodone, Latuda [lurasidone hcl], Magnesium-containing compounds, Prednisone, Tramadol, Codeine, Trazodone, Topamax [topiramate], Sulfa antibiotics, and Tape   ? ?Review of Systems   ?Review of Systems  ?Gastrointestinal:  Positive for diarrhea.  ?Neurological:  Positive for weakness.  ?All other systems reviewed and are negative. ? ?Physical Exam ?Updated Vital Signs ?BP (!) 150/93   Pulse 70   Temp 98.7 ?F (37.1 ?C) (Oral)   Resp 19   Ht '5\' 2"'$  (1.575 m)   Wt 73 kg   SpO2 97%   BMI 29.44 kg/m?  ?Physical Exam ?Vitals and nursing note  reviewed.  ?Constitutional:   ?   Appearance: [name] well-developed.  ?HENT:  ?   Head: Normocephalic and atraumatic.  ?Cardiovascular:  ?   Rate and Rhythm: Normal rate and regular rhythm.  ?Pulmonary:  ?   Effort: Pulmonary effort is normal.  ?   Breath sounds: Normal breath sounds.  ?Abdominal:  ?   Palpations: Abdomen is soft.  ?   Tenderness: There is no abdominal tenderness. There is no guarding or rebound.  ?Musculoskeletal:     ?   General: No swelling or tenderness.  ?Skin: ?   General: Skin is warm and dry.  ?Neurological:  ?   Mental Status: [name] alert and oriented to person, place, and time.  ?Psychiatric:     ?  Behavior: Behavior normal.  ? ? ?ED Results / Procedures / Treatments   ?Labs ?(all labs ordered are listed, but only abnormal results are displayed) ?Labs Reviewed  ?COMPREHENSIVE METABOLIC PANEL - Abnormal; Notable for the following components:  ?    Result Value  ? BUN 28 (*)   ? Creatinine, Ser 1.72 (*)   ? Albumin 3.2 (*)   ? Alkaline Phosphatase 160 (*)   ? GFR, Estimated 33 (*)   ? All other components within normal limits  ?CBC WITH DIFFERENTIAL/PLATELET - Abnormal; Notable for the following components:  ? WBC 11.8 (*)   ? RBC 3.60 (*)   ? Hemoglobin 10.3 (*)   ? HCT 32.6 (*)   ? Platelets 444 (*)   ? Neutro Abs 8.0 (*)   ? Abs Immature Granulocytes 0.20 (*)   ? All other components within normal limits  ?POC OCCULT BLOOD, ED - Abnormal; Notable for the following components:  ? Fecal Occult Bld POSITIVE (*)   ? All other components within normal limits  ?C DIFFICILE QUICK SCREEN W PCR REFLEX    ?GASTROINTESTINAL PANEL BY PCR, STOOL (REPLACES STOOL CULTURE)  ?LIPASE, BLOOD  ?MAGNESIUM  ? ? ?EKG ?None ? ?Radiology ?CT Abdomen Pelvis Wo Contrast ? ?Result Date: 05/20/2021 ?CLINICAL DATA:  Abdominal pain EXAM: CT ABDOMEN AND PELVIS WITHOUT CONTRAST TECHNIQUE: Multidetector CT imaging of the abdomen and pelvis was performed following the standard protocol without IV contrast. RADIATION DOSE  REDUCTION: This exam was performed according to the departmental dose-optimization program which includes automated exposure control, adjustment of the mA and/or kV according to patient size and/or use of iterative rec

## 2021-05-19 NOTE — ED Triage Notes (Signed)
Pt from home via EMS c/o generalized weakness, generalized body aches, abdominal pain, vomiting and diarrhea x1 week, all worsening over the past week. Pt incontinent of stool upon arrival. Stool appears light brown with bright red blood in it. Hemoccult card obtained and peri care provided. Pt has a few areas of open skin noted to her buttocks. Pt states that she has not been eating and drinking at home due to abdominal pain and nausea. Pt also states that she hasn't been able to walk for over a week week due to weakness. Pt A&Ox4, VSS. ?

## 2021-05-20 ENCOUNTER — Emergency Department (HOSPITAL_COMMUNITY): Payer: Medicare Other

## 2021-05-20 DIAGNOSIS — K591 Functional diarrhea: Secondary | ICD-10-CM

## 2021-05-20 DIAGNOSIS — K529 Noninfective gastroenteritis and colitis, unspecified: Secondary | ICD-10-CM | POA: Diagnosis present

## 2021-05-20 DIAGNOSIS — Z8249 Family history of ischemic heart disease and other diseases of the circulatory system: Secondary | ICD-10-CM | POA: Diagnosis not present

## 2021-05-20 DIAGNOSIS — E860 Dehydration: Secondary | ICD-10-CM | POA: Diagnosis not present

## 2021-05-20 DIAGNOSIS — F250 Schizoaffective disorder, bipolar type: Secondary | ICD-10-CM | POA: Diagnosis present

## 2021-05-20 DIAGNOSIS — D631 Anemia in chronic kidney disease: Secondary | ICD-10-CM | POA: Diagnosis present

## 2021-05-20 DIAGNOSIS — A09 Infectious gastroenteritis and colitis, unspecified: Secondary | ICD-10-CM | POA: Diagnosis not present

## 2021-05-20 DIAGNOSIS — G894 Chronic pain syndrome: Secondary | ICD-10-CM | POA: Diagnosis present

## 2021-05-20 DIAGNOSIS — E1122 Type 2 diabetes mellitus with diabetic chronic kidney disease: Secondary | ICD-10-CM | POA: Diagnosis present

## 2021-05-20 DIAGNOSIS — Z87891 Personal history of nicotine dependence: Secondary | ICD-10-CM | POA: Diagnosis not present

## 2021-05-20 DIAGNOSIS — G4733 Obstructive sleep apnea (adult) (pediatric): Secondary | ICD-10-CM | POA: Diagnosis not present

## 2021-05-20 DIAGNOSIS — J439 Emphysema, unspecified: Secondary | ICD-10-CM | POA: Diagnosis present

## 2021-05-20 DIAGNOSIS — I2510 Atherosclerotic heart disease of native coronary artery without angina pectoris: Secondary | ICD-10-CM | POA: Diagnosis present

## 2021-05-20 DIAGNOSIS — Z8674 Personal history of sudden cardiac arrest: Secondary | ICD-10-CM | POA: Diagnosis not present

## 2021-05-20 DIAGNOSIS — R5381 Other malaise: Secondary | ICD-10-CM | POA: Diagnosis not present

## 2021-05-20 DIAGNOSIS — F4312 Post-traumatic stress disorder, chronic: Secondary | ICD-10-CM | POA: Diagnosis present

## 2021-05-20 DIAGNOSIS — Z818 Family history of other mental and behavioral disorders: Secondary | ICD-10-CM | POA: Diagnosis not present

## 2021-05-20 DIAGNOSIS — Z7401 Bed confinement status: Secondary | ICD-10-CM | POA: Diagnosis not present

## 2021-05-20 DIAGNOSIS — N1832 Chronic kidney disease, stage 3b: Secondary | ICD-10-CM | POA: Diagnosis not present

## 2021-05-20 DIAGNOSIS — R531 Weakness: Secondary | ICD-10-CM | POA: Diagnosis not present

## 2021-05-20 DIAGNOSIS — I10 Essential (primary) hypertension: Secondary | ICD-10-CM | POA: Diagnosis not present

## 2021-05-20 DIAGNOSIS — Z8673 Personal history of transient ischemic attack (TIA), and cerebral infarction without residual deficits: Secondary | ICD-10-CM | POA: Diagnosis not present

## 2021-05-20 DIAGNOSIS — F2081 Schizophreniform disorder: Secondary | ICD-10-CM | POA: Diagnosis not present

## 2021-05-20 DIAGNOSIS — E1140 Type 2 diabetes mellitus with diabetic neuropathy, unspecified: Secondary | ICD-10-CM | POA: Diagnosis present

## 2021-05-20 DIAGNOSIS — R062 Wheezing: Secondary | ICD-10-CM | POA: Diagnosis not present

## 2021-05-20 DIAGNOSIS — Z90710 Acquired absence of both cervix and uterus: Secondary | ICD-10-CM | POA: Diagnosis not present

## 2021-05-20 DIAGNOSIS — R1084 Generalized abdominal pain: Secondary | ICD-10-CM | POA: Diagnosis not present

## 2021-05-20 DIAGNOSIS — F419 Anxiety disorder, unspecified: Secondary | ICD-10-CM | POA: Diagnosis not present

## 2021-05-20 DIAGNOSIS — E785 Hyperlipidemia, unspecified: Secondary | ICD-10-CM | POA: Diagnosis present

## 2021-05-20 DIAGNOSIS — J45909 Unspecified asthma, uncomplicated: Secondary | ICD-10-CM | POA: Diagnosis not present

## 2021-05-20 DIAGNOSIS — R197 Diarrhea, unspecified: Secondary | ICD-10-CM | POA: Diagnosis not present

## 2021-05-20 DIAGNOSIS — F203 Undifferentiated schizophrenia: Secondary | ICD-10-CM | POA: Diagnosis not present

## 2021-05-20 DIAGNOSIS — I5032 Chronic diastolic (congestive) heart failure: Secondary | ICD-10-CM | POA: Diagnosis present

## 2021-05-20 DIAGNOSIS — J9611 Chronic respiratory failure with hypoxia: Secondary | ICD-10-CM | POA: Diagnosis present

## 2021-05-20 DIAGNOSIS — I252 Old myocardial infarction: Secondary | ICD-10-CM | POA: Diagnosis not present

## 2021-05-20 DIAGNOSIS — F32A Depression, unspecified: Secondary | ICD-10-CM | POA: Diagnosis not present

## 2021-05-20 DIAGNOSIS — R109 Unspecified abdominal pain: Secondary | ICD-10-CM | POA: Diagnosis not present

## 2021-05-20 DIAGNOSIS — F209 Schizophrenia, unspecified: Secondary | ICD-10-CM | POA: Diagnosis not present

## 2021-05-20 DIAGNOSIS — I130 Hypertensive heart and chronic kidney disease with heart failure and stage 1 through stage 4 chronic kidney disease, or unspecified chronic kidney disease: Secondary | ICD-10-CM | POA: Diagnosis present

## 2021-05-20 DIAGNOSIS — J9612 Chronic respiratory failure with hypercapnia: Secondary | ICD-10-CM | POA: Diagnosis present

## 2021-05-20 DIAGNOSIS — F1421 Cocaine dependence, in remission: Secondary | ICD-10-CM | POA: Diagnosis present

## 2021-05-20 LAB — CBC WITH DIFFERENTIAL/PLATELET
Abs Immature Granulocytes: 0.17 10*3/uL — ABNORMAL HIGH (ref 0.00–0.07)
Basophils Absolute: 0.1 10*3/uL (ref 0.0–0.1)
Basophils Relative: 1 %
Eosinophils Absolute: 0.3 10*3/uL (ref 0.0–0.5)
Eosinophils Relative: 2 %
HCT: 37.2 % (ref 36.0–46.0)
Hemoglobin: 11.3 g/dL — ABNORMAL LOW (ref 12.0–15.0)
Immature Granulocytes: 1 %
Lymphocytes Relative: 12 %
Lymphs Abs: 1.6 10*3/uL (ref 0.7–4.0)
MCH: 28.4 pg (ref 26.0–34.0)
MCHC: 30.4 g/dL (ref 30.0–36.0)
MCV: 93.5 fL (ref 80.0–100.0)
Monocytes Absolute: 0.5 10*3/uL (ref 0.1–1.0)
Monocytes Relative: 4 %
Neutro Abs: 10.9 10*3/uL — ABNORMAL HIGH (ref 1.7–7.7)
Neutrophils Relative %: 80 %
Platelets: 392 10*3/uL (ref 150–400)
RBC: 3.98 MIL/uL (ref 3.87–5.11)
RDW: 14.3 % (ref 11.5–15.5)
WBC: 13.4 10*3/uL — ABNORMAL HIGH (ref 4.0–10.5)
nRBC: 0 % (ref 0.0–0.2)

## 2021-05-20 LAB — GASTROINTESTINAL PANEL BY PCR, STOOL (REPLACES STOOL CULTURE)

## 2021-05-20 LAB — MAGNESIUM: Magnesium: 1.7 mg/dL (ref 1.7–2.4)

## 2021-05-20 LAB — COMPREHENSIVE METABOLIC PANEL
ALT: 29 U/L (ref 0–44)
AST: 33 U/L (ref 15–41)
Albumin: 3.3 g/dL — ABNORMAL LOW (ref 3.5–5.0)
Alkaline Phosphatase: 172 U/L — ABNORMAL HIGH (ref 38–126)
Anion gap: 9 (ref 5–15)
BUN: 26 mg/dL — ABNORMAL HIGH (ref 8–23)
CO2: 22 mmol/L (ref 22–32)
Calcium: 9 mg/dL (ref 8.9–10.3)
Chloride: 105 mmol/L (ref 98–111)
Creatinine, Ser: 1.29 mg/dL — ABNORMAL HIGH (ref 0.44–1.00)
GFR, Estimated: 47 mL/min — ABNORMAL LOW (ref >60)
Glucose, Bld: 97 mg/dL (ref 70–99)
Potassium: 3.4 mmol/L — ABNORMAL LOW (ref 3.5–5.1)
Sodium: 136 mmol/L (ref 135–145)
Total Bilirubin: 0.6 mg/dL (ref 0.3–1.2)
Total Protein: 6.6 g/dL (ref 6.5–8.1)

## 2021-05-20 LAB — C DIFFICILE QUICK SCREEN W PCR REFLEX
C Diff antigen: NEGATIVE
C Diff interpretation: NOT DETECTED
C Diff toxin: NEGATIVE

## 2021-05-20 LAB — PHOSPHORUS: Phosphorus: 3.9 mg/dL (ref 2.5–4.6)

## 2021-05-20 MED ORDER — PROCHLORPERAZINE EDISYLATE 10 MG/2ML IJ SOLN: 10.0000 mg | Freq: Four times a day (QID) | INTRAMUSCULAR | Status: DC | PRN

## 2021-05-20 MED ORDER — ENOXAPARIN SODIUM 40 MG/0.4ML IJ SOSY: 40.0000 mg | PREFILLED_SYRINGE | INTRAMUSCULAR | Status: DC

## 2021-05-20 MED ORDER — ALBUTEROL SULFATE (2.5 MG/3ML) 0.083% IN NEBU: 2.5000 mg | INHALATION_SOLUTION | Freq: Four times a day (QID) | RESPIRATORY_TRACT | Status: DC | PRN

## 2021-05-20 MED ORDER — AMLODIPINE BESYLATE 10 MG PO TABS
10.0000 mg | ORAL_TABLET | Freq: Every day | ORAL | Status: DC
  Administered 2021-05-20 – 2021-06-02 (×14): 10 mg via ORAL
  Filled 2021-05-20 (×14): qty 1

## 2021-05-20 MED ORDER — TERBINAFINE HCL 1 % EX CREA
1.0000 "application " | TOPICAL_CREAM | Freq: Three times a day (TID) | CUTANEOUS | Status: DC
  Administered 2021-05-20 – 2021-06-02 (×41): 1 via TOPICAL
  Filled 2021-05-20 (×3): qty 12

## 2021-05-20 MED ORDER — LACTATED RINGERS IV SOLN: INTRAVENOUS | Status: AC

## 2021-05-20 MED ORDER — DULOXETINE HCL 30 MG PO CPEP
30.0000 mg | ORAL_CAPSULE | Freq: Every day | ORAL | Status: DC
  Administered 2021-05-20 – 2021-06-02 (×14): 30 mg via ORAL
  Filled 2021-05-20 (×14): qty 1

## 2021-05-20 MED ORDER — OXYCODONE HCL 5 MG PO TABS
5.0000 mg | ORAL_TABLET | ORAL | Status: DC | PRN
  Administered 2021-05-20 – 2021-06-02 (×10): 5 mg via ORAL
  Filled 2021-05-20 (×12): qty 1

## 2021-05-20 MED ORDER — ACETAMINOPHEN 325 MG PO TABS
650.0000 mg | ORAL_TABLET | Freq: Four times a day (QID) | ORAL | Status: DC | PRN
  Administered 2021-05-20 – 2021-06-02 (×8): 650 mg via ORAL
  Filled 2021-05-20 (×8): qty 2

## 2021-05-20 MED ORDER — CARVEDILOL 6.25 MG PO TABS
6.2500 mg | ORAL_TABLET | Freq: Two times a day (BID) | ORAL | Status: DC
  Administered 2021-05-20 – 2021-06-02 (×28): 6.25 mg via ORAL
  Filled 2021-05-20 (×28): qty 1

## 2021-05-20 MED ORDER — ALBUTEROL SULFATE HFA 108 (90 BASE) MCG/ACT IN AERS: 2.0000 | INHALATION_SPRAY | Freq: Four times a day (QID) | RESPIRATORY_TRACT | Status: DC | PRN

## 2021-05-20 NOTE — ED Notes (Signed)
Lab called to add on GI panel by PCR to stool specimen in lab. ?

## 2021-05-20 NOTE — ED Notes (Signed)
Pt incontinent of watery green colored stool. Specimen obtained and walked to lab. Pt placed on bedpan as she states she has to urinate. Pt given some privacy to use bedpan as she was unable to with staff in room. ?

## 2021-05-20 NOTE — H&P (Addendum)
History and Physical  Tiffany [name] WUJ:[id number] DOB: 1960/07/06 DOA: 05/19/2021  Referring physician: Dr. Madilyn Hook, EDP. PCP: [name], MD  Outpatient Specialists: Cardiology, GI. Patient coming from: Home via EMS.  Chief Complaint: Diarrhea, generalized weakness, body aches.  HPI: Tiffany [name] is a 61 y.o. female with medical history significant for coronary artery disease, carotid artery disease, schizophrenia, PTSD, history of cardiac arrest and anoxic brain injury, gout, anemia of chronic disease, GERD, diverticulosis, OSA, chronic pain syndrome, COPD, chronic diastolic CHF, CKD 3B, who presented to [facility] ED from home due to generalized weakness and severe diarrhea of 1 week duration.  Associated with generalized body aches.  Patient was recently admitted on 04/24/2021 and discharged on 04/30/2021 to SNF after being treated for low back pain and seen by neurosurgery.  EMS was activated from her home where she lives alone.  She was found incontinent of watery green-colored stools.  She was brought to the ED profile evaluation.    In the ED, she was noted to have light brown watery stool with bright red blood in it.  Hemoccult positive.  Endorses she has not been eating and drinking at home due to abdominal pain and nausea.  Not able to walk for days due to her generalized weakness.  Stool samples were collected and sent for analysis.  Patient received 1 L IV fluid LR x1.  TRH, hospitalist service, was asked to admit.  ED Course: Tmax 98.8.  BP 164/99, pulse 71, respiratory 18, saturation 98% on room air.  Lab studies significant for BUN 24, creatinine 1.66 with baseline creatinine of 1.3 and GFR 46.  WBC 11.8, hemoglobin 10.3 K, platelet count 444 K.  Review of Systems: Review of systems as noted in the HPI. All other systems reviewed and are negative.   Past Medical History:  Diagnosis Date   Agitation 11/22/2017   Anoxic brain injury (HCC) 09/08/2016   C. Arrest due to respiratory  failure and COPD exacerbation   Anxiety    Arthritis    "all over" (04/10/2016)   Asthma 10/18/2010   Binge eating disorder    Cardiac arrest (HCC) 09/08/2016   PEA   Carotid artery stenosis    1-39% bilateral by dopplers 11/2016   Chronic diastolic (congestive) heart failure (HCC)    Chronic kidney disease, stage 3 (HCC)    Chronic pain syndrome 06/18/2012   Chronic post-traumatic stress disorder (PTSD) 05/27/2018   Chronic respiratory failure with hypoxia and hypercapnia (HCC) 06/22/2015   TRILOGY Vent >AVAPA-ES., Vt target 200-400, Max P 30 , PS max 20 , PS min 6-10 , E Max 6, E Min 4, Rate Auto AVAPS Rate 2 (titrate for pt comfort) , bleed O2 at 5l/m continuous flow .    Closed displaced fracture of fifth metacarpal bone 03/21/2018   Cocaine use disorder, severe, in sustained remission (HCC) 12/17/2015   Complication of anesthesia    decreased bp, decreased heart rate   COPD (chronic obstructive pulmonary disease) (HCC) 07/08/2014   Depression    Diabetic neuropathy (HCC) 04/24/2011   Difficulty with speech 01/24/2018   Disorder of nervous system    Drug abuse (HCC) 11/21/2017   Dyslipidemia 04/24/2011   Elevated troponin 04/28/2012   Emphysema    Encephalopathy 11/21/2017   Essential hypertension 03/22/2016   Fibula fracture 07/10/2016   Frequent falls 10/11/2017   GERD (gastroesophageal reflux disease)    Gout 04/11/2017   Heart attack (HCC) 1980s   History of blood transfusion 1994   "  couldn't stop bleeding from my period"   History of drug abuse in remission (HCC) 11/28/2015   Quit in 2017   Hyperlipidemia LDL goal <70    Incontinence    Manic depression (HCC)    Morbid obesity (HCC) 10/18/2010   Obstructive sleep apnea 10/18/2010   On home oxygen therapy    "6L; 24/7" (04/10/2016)   OSA on CPAP    "wear mask sometimes" (04/10/2016)   Paranoid (HCC)    "sometimes; I'm on RX for it" (04/10/2016)   Prolonged Q-T interval on ECG    Rectal bleeding 12/31/2015    Schizoaffective disorder, bipolar type (HCC) 04/05/2018   Seasonal allergies    Seborrheic keratoses 12/31/2013   Seizures (HCC)    "don't know what kind; last one was ~ 1 yr ago" (04/10/2016)   Sinus bradycardia    Stroke [facility]) 1980s   denies residual on 04/10/2016   Thrush 09/19/2013   Type 2 diabetes mellitus (HCC) 10/18/2010   Past Surgical History:  Procedure Laterality Date   CESAREAN SECTION  1997   COLONOSCOPY WITH PROPOFOL N/A 04/01/2021   Procedure: COLONOSCOPY WITH PROPOFOL;  Surgeon: Jeani Hawking, MD;  Location: [facility] ENDOSCOPY;  Service: Gastroenterology;  Laterality: N/A;  Rectal bleeding with drop in hemoglobin to 7.2 g/dL   HERNIA REPAIR     IR CHOLANGIOGRAM EXISTING TUBE  07/20/2016   IR PERC CHOLECYSTOSTOMY  05/10/2016   IR RADIOLOGIST EVAL & MGMT  06/08/2016   IR RADIOLOGIST EVAL & MGMT  06/29/2016   IR SINUS/FIST TUBE CHK-NON GI  07/12/2016   RIGHT/LEFT HEART CATH AND CORONARY ANGIOGRAPHY N/A 06/19/2017   Procedure: RIGHT/LEFT HEART CATH AND CORONARY ANGIOGRAPHY;  Surgeon: Dolores Patty, MD;  Location: [facility] INVASIVE CV LAB;  Service: Cardiovascular;  Laterality: N/A;   TIBIA IM NAIL INSERTION Right 07/12/2016   Procedure: INTRAMEDULLARY (IM) NAIL RIGHT TIBIA;  Surgeon: Tarry Kos, MD;  Location: [facility] OR;  Service: Orthopedics;  Laterality: Right;   UMBILICAL HERNIA REPAIR  ~ 1963   "that's why I don't have a belly button"   VAGINAL HYSTERECTOMY      Social History:  reports that she quit smoking about 5 years ago. Her smoking use included cigarettes. She started smoking about 44 years ago. She has a 57.00 pack-year smoking history. She has never used smokeless tobacco. She reports that she does not currently use drugs after having used the following drugs: Cocaine. She reports that she does not drink alcohol.   Allergies  Allergen Reactions   Hydrocodone Shortness Of Breath   Latuda [Lurasidone Hcl] Anaphylaxis   Magnesium-Containing Compounds Anaphylaxis     Tolerated Ensure   Prednisone Anaphylaxis, Swelling and Other (See Comments)    Tongue swelling, lip swelling, throat swelling, per pt    Tramadol Anaphylaxis and Swelling   Codeine Nausea And Vomiting   Trazodone Other (See Comments)    paranoia   Topamax [Topiramate] Other (See Comments)    Increases paranoia   Sulfa Antibiotics Itching   Tape Rash    Family History  Problem Relation Age of Onset   Cancer Mother        lung   Depression Mother    Cancer Father        prostate   Depression Sister    Anxiety disorder Sister    Schizophrenia Sister    Bipolar disorder Sister    Depression Sister    Depression Brother    Heart failure Other  cousin      Prior to Admission medications   Medication Sig Start Date End Date Taking? Authorizing Provider  albuterol (PROVENTIL) (2.5 MG/3ML) 0.083% nebulizer solution Take 3 mLs (2.5 mg total) by nebulization every 6 (six) hours as needed for wheezing or shortness of breath. 12/16/20   Hoy Register, MD  albuterol (VENTOLIN HFA) 108 (90 Base) MCG/ACT inhaler Inhale 2 puffs into the lungs every 6 (six) hours as needed for wheezing or shortness of breath. 02/09/21   Hoy Register, MD  amLODipine (NORVASC) 10 MG tablet Take 20 mg by mouth daily.    [provider]  Asenapine Maleate 10 MG SUBL Place 1 tablet (10 mg total) under the tongue 2 (two) times daily. 04/06/21   Burnadette Pop, MD  atorvastatin (LIPITOR) 20 MG tablet Take 1 tablet (20 mg total) by mouth at bedtime. 01/14/21   Almon Hercules, MD  carvedilol (COREG) 6.25 MG tablet Take 1 tablet (6.25 mg total) by mouth 2 (two) times daily with a meal. 04/19/21   Hoy Register, MD  colchicine 0.6 MG tablet Take 2 tablets by mouth at the onset of gout flare, may repeat 1 tablet in 1 hour if symptoms persist Patient taking differently: Take 0.6 mg by mouth every morning. 01/14/21   Almon Hercules, MD  diclofenac Sodium (VOLTAREN) 1 % GEL Apply 4 g topically 4 (four) times  daily as needed (pain). 04/19/21   Hoy Register, MD  dicyclomine (BENTYL) 10 MG capsule Take 1 capsule (10 mg total) by mouth 3 (three) times daily before meals. 12/24/20   Hoy Register, MD  DULoxetine (CYMBALTA) 30 MG capsule Take 30 mg by mouth daily. 03/22/21   [provider]  escitalopram (LEXAPRO) 20 MG tablet Take 20 mg by mouth every morning.    [provider]  Fluticasone-Umeclidin-Vilant (TRELEGY ELLIPTA) 100-62.5-25 MCG/ACT AEPB Inhale 1 puff into the lungs daily. 01/14/21   Almon Hercules, MD  gabapentin (NEURONTIN) 100 MG capsule Take 2 capsules (200 mg total) by mouth 3 (three) times daily. 04/29/21   Leroy Sea, MD  Iron, Ferrous Sulfate, 325 (65 Fe) MG TABS Take 325 mg by mouth daily. 01/14/21   Almon Hercules, MD  ketoconazole (NIZORAL) 2 % cream Apply 1 application. topically daily. Apply to finger nails 09/27/20   [provider]  lisdexamfetamine (VYVANSE) 10 MG capsule Take 1 capsule (10 mg total) by mouth daily at 12 noon. 04/06/21   Burnadette Pop, MD  montelukast (SINGULAIR) 10 MG tablet Take 1 tablet (10 mg total) by mouth at bedtime. 01/14/21   Almon Hercules, MD  morphine (MS CONTIN) 15 MG 12 hr tablet Take 1 tablet (15 mg total) by mouth every 12 (twelve) hours. 04/29/21   Leroy Sea, MD  Multiple Vitamin (MULTIVITAMIN WITH MINERALS) TABS tablet Take 1 tablet by mouth daily. 01/14/21   Almon Hercules, MD  omeprazole (PRILOSEC) 40 MG capsule TAKE 1 CAPSULE(40 MG) BY MOUTH DAILY Patient taking differently: Take 40 mg by mouth every morning. 10/23/20   Hoy Register, MD  oxybutynin (DITROPAN) 5 MG tablet TAKE 1 TABLET(5 MG) BY MOUTH TWICE DAILY Patient taking differently: Take 5 mg by mouth 2 (two) times daily. 10/23/20   Hoy Register, MD  oxyCODONE-acetaminophen (PERCOCET/ROXICET) 5-325 MG tablet Take 1 tablet by mouth every 6 (six) hours as needed for severe pain. 04/29/21   Leroy Sea, MD  polyethylene glycol (MIRALAX /  GLYCOLAX) 17 g packet Take 17 g by mouth  daily as needed for mild constipation. 04/29/21   Leroy Sea, MD  potassium chloride SA (KLOR-CON M) 20 MEQ tablet Take 1 tablet (20 mEq total) by mouth daily. Patient not taking: Reported on 04/24/2021 01/17/21 04/24/21  Almon Hercules, MD  prazosin (MINIPRESS) 2 MG capsule TAKE 1 CAPSULE(2 MG) BY MOUTH AT BEDTIME 05/16/21   Hoy Register, MD  senna (SENOKOT) 8.6 MG TABS tablet Take 1 tablet (8.6 mg total) by mouth daily as needed for mild constipation. 04/29/21   Leroy Sea, MD  sodium chloride (OCEAN) 0.65 % SOLN nasal spray Place 2 sprays into both nostrils daily.    [provider]  terbinafine (LAMISIL AT) 1 % cream Apply 1 application topically 2 (two) times daily. Patient taking differently: Apply 1 application. topically 3 (three) times daily. Apply to feet 09/07/20   Hoy Register, MD  thiamine 100 MG tablet Take 1 tablet (100 mg total) by mouth daily. Patient not taking: Reported on 03/30/2021 02/27/20   Elease Etienne, MD  torsemide (DEMADEX) 20 MG tablet Take 1 tablet (20 mg total) by mouth daily. 04/19/21   Hoy Register, MD    Physical Exam: BP (!) 164/99   Pulse 71   Temp 98.7 F (37.1 C) (Oral)   Resp 18   Ht 5\' 2"  (1.575 m)   Wt 73 kg   SpO2 98%   BMI 29.44 kg/m   General: 61 y.o. year-old female well developed well nourished in no acute distress.  Somnolent but arousable to voices. Cardiovascular: Regular rate and rhythm with no rubs or gallops.  No thyromegaly or JVD noted.  No lower extremity edema bilaterally. Respiratory: Clear to auscultation with no wheezes or rales. Good inspiratory effort. Abdomen: Soft tender diffusely with minimal palpation.  Nondistended with normal bowel sounds x4 quadrants. Muskuloskeletal: No cyanosis, clubbing or edema noted bilaterally Neuro: CN II-XII intact, strength, sensation, reflexes Skin: No ulcerative lesions noted or rashes Psychiatry: Mood is appropriate for condition  and setting          Labs on Admission:  Basic Metabolic Panel: Recent Labs  Lab 05/19/21 2300  NA 138  K 3.8  CL 106  CO2 26  GLUCOSE 75  BUN 28*  CREATININE 1.72*  CALCIUM 9.2  MG 1.8   Liver Function Tests: Recent Labs  Lab 05/19/21 2300  AST 28  ALT 24  ALKPHOS 160*  BILITOT 0.4  PROT 6.5  ALBUMIN 3.2*   Recent Labs  Lab 05/19/21 2300  LIPASE 40   No results for input(s): AMMONIA in the last 168 hours. CBC: Recent Labs  Lab 05/19/21 2300  WBC 11.8*  NEUTROABS 8.0*  HGB 10.3*  HCT 32.6*  MCV 90.6  PLT 444*   Cardiac Enzymes: No results for input(s): CKTOTAL, CKMB, CKMBINDEX, TROPONINI in the last 168 hours.  BNP (last 3 results) Recent Labs    04/26/21 0349 04/27/21 0340 04/28/21 0233  BNP 26.4 11.9 14.2    ProBNP (last 3 results) Recent Labs    06/04/20 1140 06/15/20 1636 07/13/20 1310  PROBNP 11,232* 2,313* 201    CBG: No results for input(s): GLUCAP in the last 168 hours.  Radiological Exams on Admission: CT Abdomen Pelvis Wo Contrast  Result Date: 05/20/2021 CLINICAL DATA:  Abdominal pain EXAM: CT ABDOMEN AND PELVIS WITHOUT CONTRAST TECHNIQUE: Multidetector CT imaging of the abdomen and pelvis was performed following the standard protocol without IV contrast. RADIATION DOSE REDUCTION: This exam was performed according to the departmental dose-optimization  program which includes automated exposure control, adjustment of the mA and/or kV according to patient size and/or use of iterative reconstruction technique. COMPARISON:  04/24/2021 FINDINGS: Lower Chest: Bibasilar atelectasis Hepatobiliary: Normal hepatic contours. No intra- or extrahepatic biliary dilatation. There is cholelithiasis without acute inflammation. Pancreas: Normal pancreas. No ductal dilatation or peripancreatic fluid collection. Spleen: Normal. Adrenals/Urinary Tract: The adrenal glands are normal. No hydronephrosis, nephroureterolithiasis or solid renal mass. The  urinary bladder is normal for degree of distention Stomach/Bowel: There is no hiatal hernia. Normal duodenal course and caliber. No small bowel dilatation or inflammation. No focal colonic abnormality. Normal appendix. Vascular/Lymphatic: There is calcific atherosclerosis of the abdominal aorta. No lymphadenopathy. Reproductive: Left lower quadrant cystic structure measures 8.2 x 6.2 cm, previously 6.9 x 6.3 cm. Other: None. Musculoskeletal: Progressive endplate destruction at L3-4, particularly of the posteroinferior corner of L3. There is at least mild spinal canal stenosis at this level. No paraspinal abscess. IMPRESSION: 1. Progressive endplate destruction at L3-4, particularly of the posteroinferior corner of L3, consistent with chronic discitis/osteomyelitis. 2. Slight increase in size of left lower quadrant cystic structure. 3. Cholelithiasis without acute inflammation. Aortic Atherosclerosis (ICD10-I70.0). Electronically Signed   By: Deatra Robinson M.D.   On: 05/20/2021 01:12    EKG: I independently viewed the EKG done and my findings are as followed: Sinus rhythm rate of 66.  Nonspecific ST-T changes.  QTc 459.  Assessment/Plan Present on Admission:  Diarrhea  Principal Problem:   Diarrhea  Acute diarrhea x1 week, rule out infective process. Follow C. difficile PCR and GI PCR panel Continue gentle IV fluid hydration Replace electrolytes as indicated If C. difficile PCR negative start Imodium as needed  Transient GI bleed with positive FOBT Appears to have resolved Holding off pharmacological DVT prophylaxis due to reported transient GI bleed. History of diverticulosis in the entire colon, seen on colonoscopy done on 04/01/2021. Monitor H&H, transfuse hemoglobin less than 7.0.  CKD 3B Close to baseline creatinine Avoid dehydration with ongoing diarrhea Avoid nephrotoxic agents. Continue IV fluid hydration Monitor urine output and renal function.  Essential hypertension, BP not at  goal, elevated Resume home Coreg Continue to monitor vital signs  Chronic anxiety/depression Resume home duloxetine  Schizophrenia/PTSD Resume home regimen  Generalized weakness PT OT to assess tomorrow when more stable and diarrhea has improved. Fall precautions TOC consulted to assist with DC planning   Critical care time: 65 minutes.    DVT prophylaxis: SCDs  Code Status: Full code  Family Communication: None at bedside  Disposition Plan: Admitted to telemetry unit  Consults called: None  Admission status: Inpatient status.   Status is: Inpatient Patient requires at least 2 midnights for further evaluation and treatment of present condition.   Darlin Drop MD [facility] Pager [phone]  If 7PM-7AM, please contact night-coverage [email or web address] Password [email or web address]  05/20/2021, 4:21 AM

## 2021-05-20 NOTE — ED Notes (Signed)
Pt taken off bedpan and wasn't able to urinate, but did have some more green watery stool in bedpan. Pt c/o leg cramping when turning. Pt provided lemon lime soda. ?

## 2021-05-20 NOTE — Progress Notes (Signed)
?  Progress Note ? ? ?Patient: Tiffany [name]:[id number] DOB: 1961-01-12 DOA: 05/19/2021     0 ?DOS: the patient was seen and examined on 05/20/2021 ?  ?Brief hospital course: ?No notes on file ? ?Assessment and Plan: ?No notes have been filed under this hospital service. ?Service: Hospitalist ? ?Acute diarrhea x1 week, rule out infective process. ?Given gentle IV fluid hydration ?Cdiff neg, GI panel neg ?No further diarrhea this afternoon, advance diet as tolerated ?  ?Transient GI bleed with positive FOBT ?Appears to have resolved ?Holding off pharmacological DVT prophylaxis due to reported transient GI bleed. ?History of diverticulosis in the entire colon, seen on colonoscopy done on 04/01/2021. ?Hgb stable at 11.3 ?  ?CKD 3B ?Close to baseline creatinine ?Avoid dehydration with ongoing diarrhea ?Avoid nephrotoxic agents. ?Cr improved at 1.29 with IVF ?Monitor urine output and renal function. ?  ?Essential hypertension, BP not at goal, elevated ?Resumed home Coreg ?BP stable ?  ?Chronic anxiety/depression ?Resume home duloxetine ?  ?Schizophrenia/PTSD ?Resume home regimen ?  ?Generalized weakness ?PT OT to assess tomorrow when more stable and diarrhea has improved. ?Fall precautions ?TOC consulted to assist with DC planning ?  ?  ? ?Subjective: Very tired this afternoon ? ?Physical Exam: ?Vitals:  ? 05/20/21 0430 05/20/21 0600 05/20/21 0821 05/20/21 1455  ?BP: (!) 150/93 (!) 167/96 (!) 162/98 (!) 145/101  ?Pulse: 70 66 76 81  ?Resp: '19 16 16 14  '$ ?Temp:   98.4 ?F (36.9 ?C) 98 ?F (36.7 ?C)  ?TempSrc:   Oral Axillary  ?SpO2: 97% 97% 100% 95%  ?Weight:      ?Height:      ? ?General exam: Awake, laying in bed, in nad ?Respiratory system: Normal respiratory effort, no wheezing ?Cardiovascular system: regular rate, s1, s2 ?Gastrointestinal system: Soft, nondistended, positive BS ?Central nervous system: CN2-12 grossly intact, strength intact ?Extremities: Perfused, no clubbing ?Skin: Normal skin turgor, no notable skin  lesions seen ?Psychiatry: Mood normal // no visual hallucinations  ? ?Data Reviewed: ? ?Labs reviewed: Cr 1.29, stool studies neg ? ?Family Communication: Pt in room, family not at bedside ? ?Disposition: ?Status is: Inpatient ?Remains inpatient appropriate because: severity of illness ? Planned Discharge Destination: Skilled nursing facility ? ? ? ? ?Author: ?Marylu Lund, MD ?05/20/2021 5:07 PM ? ?For on call review [email or web address].  ?

## 2021-05-20 NOTE — Evaluation (Signed)
Occupational Therapy Evaluation ?Patient Details ?Name: Tiffany [name] ?MRN: [id number] ?DOB: May 18, 1960 ?Today's Date: 05/20/2021 ? ? ?History of Present Illness Patient is a 61 year old female with recent D/C from [facility] with cauda equina syndrome. Plan was to D/C to rehab however patient left AMA and has been at home since April 20th. EMS called 5/4 and patient presenting with generalized weakness, generalized body aches, abdominal pain, vomiting and diarrhea x1 week. PMH: coronary artery disease, carotid artery disease, schizophrenia, PTSD, history of cardiac arrest and anoxic brain injury, gout, anemia of chronic disease, GERD, diverticulosis, OSA, chronic pain syndrome, COPD, chronic diastolic CHF, CKD 3B  ? ?Clinical Impression ?  ?Patient with recent discharge from [facility] in April. Plan was to go to SNF rehab however patient left and has been at home since ~ April 19th. When OT asking patient how she was managing at home such as getting to bedside commode patient replies "I wasn't." Appears patient was having significant difficulty taking care of herself at home and she lives alone. Currently patient limited by pain in bilateral thighs reporting cramping with movement. Made attempts to sit up at edge of bed needing total A to manage legs however with repeated attempts to upright trunk patient calls out in pain, becomes tearful and asks to stop therefore patient returned to sidelying in bed. Patient is aware there is "something going on with my back and my nerves" but could not recall cauda equina syndrome. Patient also stating she cannot have surgery "anesthesia would kill me." Due to chronic condition of patient's spine/lower extremity weakness patient with guarded potential however will trial acute OT services with plan for D/C to rehab however may need to transition to long term care.  ?   ? ?Recommendations for follow up therapy are one component of a multi-disciplinary discharge planning process, led by the  attending physician.  Recommendations may be updated based on patient status, additional functional criteria and insurance authorization.  ? ?Follow Up Recommendations ? Skilled nursing-short term rehab (<3 hours/day)  ?  ?Assistance Recommended at Discharge Frequent or constant Supervision/Assistance  ?Patient can return home with the following Two people to help with walking and/or transfers;Two people to help with bathing/dressing/bathroom;Direct supervision/assist for medications management;Direct supervision/assist for financial management;Assist for transportation;Help with stairs or ramp for entrance;Assistance with cooking/housework ? ?  ?Functional Status Assessment ? Patient has had a recent decline in their functional status and/or demonstrates limited ability to make significant improvements in function in a reasonable and predictable amount of time  ?Equipment Recommendations ? None recommended by OT  ?  ?   ?Precautions / Restrictions Precautions ?Precautions: Fall ?Restrictions ?Weight Bearing Restrictions: No  ? ?  ? ?Mobility Bed Mobility ?Overal bed mobility: Needs Assistance ?  ?  ?  ?  ?  ?  ?General bed mobility comments: Attempts made to sit patient up at edge of bed needing total A to slowly bring legs to edge of bed. Made two attempts to upright trunk however patient becomes more tearful and asking to stop ?  ? ?Transfers ?  ?  ?  ?  ?  ?  ?  ?  ?  ?General transfer comment: unable ?  ? ?  ?   ? ?ADL either performed or assessed with clinical judgement  ? ?ADL Overall ADL's : Needs assistance/impaired ?Eating/Feeding: Set up;Bed level ?  ?Grooming: Minimal assistance;Bed level ?  ?Upper Body Bathing: Moderate assistance;Bed level ?  ?Lower Body Bathing: Total assistance;Bed level ?  ?  Upper Body Dressing : Moderate assistance;Bed level ?  ?Lower Body Dressing: Total assistance;Bed level ?  ?  ?Toilet Transfer Details (indicate cue type and reason): Unable as patient having significant pain in  legs with attempting to sit upright ?Toileting- Clothing Manipulation and Hygiene: Total assistance;Bed level ?  ?  ?  ?  ?General ADL Comments: Session limited 2* patient report of pain in bilateral thighs, becomes tearful.  ? ? ? ? ?Pertinent Vitals/Pain Pain Assessment ?Pain Assessment: Faces ?Faces Pain Scale: Hurts whole lot ?Pain Location: bilateral thighs ?Pain Descriptors / Indicators: Cramping ?Pain Intervention(s): Patient requesting pain meds-RN notified  ? ? ? ?Hand Dominance Right ?  ?Extremity/Trunk Assessment Upper Extremity Assessment ?Upper Extremity Assessment: Generalized weakness;Difficult to assess due to impaired cognition ?  ?Lower Extremity Assessment ?Lower Extremity Assessment: Defer to PT evaluation ?  ?  ?  ?Communication Communication ?Communication: No difficulties ?  ?Cognition Arousal/Alertness: Awake/alert, Lethargic ?Behavior During Therapy: Anxious ?Overall Cognitive Status: Impaired/Different from baseline ?Area of Impairment: Following commands, Safety/judgement ?  ?  ?  ?  ?  ?  ?  ?  ?  ?  ?  ?Following Commands: Follows one step commands with increased time ?Safety/Judgement: Decreased awareness of safety, Decreased awareness of deficits ?  ?  ?General Comments: Patient initially groggy upon arrival however with mobility becomes more alert and tearful due to painful LEs ?  ?  ?   ?   ?   ? ? ?Home Living Family/patient expects to be discharged to:: Unsure ?Living Arrangements: Alone ?Available Help at Discharge: Personal care attendant;Family;Available PRN/intermittently ?Type of Home: Apartment ?Home Access: Level entry ?  ?  ?Home Layout: One level ?  ?  ?Bathroom Shower/Tub: Sponge bathes at baseline ?  ?  ?  ?  ?Home Equipment: Rollator (4 wheels);BSC/3in1 ?  ?Additional Comments: Information taken from previous hospital admission April 10th ?  ? ?  ?Prior Functioning/Environment Prior Level of Function : Needs assist ?  ?  ?  ?  ?  ?  ?Mobility Comments: When asked  patient how she was getting to a bedside commode at home reports "I haven't been" ?ADLs Comments: Per chart review patient had not been eating or drinking at home, patient states was unable to transfer to bedside commode. Patient having significant difficulty taking care of herself. ?  ? ?  ?  ?OT Problem List: Decreased strength;Decreased activity tolerance;Impaired balance (sitting and/or standing);Decreased cognition;Decreased safety awareness;Pain ?  ?   ?OT Treatment/Interventions: Self-care/ADL training;Therapeutic exercise;Balance training;Patient/family education;Cognitive remediation/compensation;Therapeutic activities  ?  ?OT Goals(Current goals can be found in the care plan section) Acute Rehab OT Goals ?Patient Stated Goal: Less pain ?OT Goal Formulation: With patient ?Time For Goal Achievement: 06/03/21 ?Potential to Achieve Goals: Fair  ?OT Frequency: Min 2X/week ?  ? ?   ?AM-PAC OT "6 Clicks" Daily Activity     ?Outcome Measure Help from another person eating meals?: A Little ?Help from another person taking care of personal grooming?: A Little ?Help from another person toileting, which includes using toliet, bedpan, or urinal?: Total ?Help from another person bathing (including washing, rinsing, drying)?: A Lot ?Help from another person to put on and taking off regular upper body clothing?: A Lot ?Help from another person to put on and taking off regular lower body clothing?: Total ?6 Click Score: 12 ?  ?End of Session Nurse Communication: Patient requests pain meds;Mobility status ? ?Activity Tolerance: Patient limited by pain ?Patient left: in bed;with call  bell/phone within reach;with bed alarm set ? ?OT Visit Diagnosis: Other abnormalities of gait and mobility (R26.89);Muscle weakness (generalized) (M62.81)  ?              ?Time: 5732-2025 ?OT Time Calculation (min): 17 min ?Charges:  OT General Charges ?$OT Visit: 1 Visit ?OT Evaluation ?$OT Eval Low Complexity: 1 Low ? ?Delbert Phenix OT ?OT  pager: [phone] ? ?Rosemary Holms ?05/20/2021, 1:18 PM ?

## 2021-05-20 NOTE — Hospital Course (Addendum)
Tiffany [name] is a 61 y.o. female with PMH CAD, carotid artery disease, schizophrenia, PTSD, history of cardiac arrest and anoxic brain injury, gout, anemia of chronic disease, GERD, diverticulosis, OSA, chronic pain syndrome, COPD, chronic diastolic CHF, CKD 3B, who presented to [facility] ED from home due to generalized weakness and severe diarrhea of 1 week duration. Associated with generalized body aches.  Patient was recently admitted on 04/24/2021 and discharged on 04/30/2021 to SNF after being treated for low back pain and seen by neurosurgery.  EMS was activated from her home where she lives alone.  She was found incontinent of watery green-colored stools.  She was brought to the ED profile evaluation.   ?  ?In the ED, she was noted to have light brown watery stool with bright red blood in it. Hemoccult positive.  There were no further reports of this after admission and hemoglobin remained stable. ?

## 2021-05-21 DIAGNOSIS — E860 Dehydration: Secondary | ICD-10-CM | POA: Diagnosis not present

## 2021-05-21 LAB — COMPREHENSIVE METABOLIC PANEL
ALT: 23 U/L (ref 0–44)
AST: 27 U/L (ref 15–41)
Albumin: 3 g/dL — ABNORMAL LOW (ref 3.5–5.0)
Alkaline Phosphatase: 134 U/L — ABNORMAL HIGH (ref 38–126)
Anion gap: 9 (ref 5–15)
BUN: 24 mg/dL — ABNORMAL HIGH (ref 8–23)
CO2: 24 mmol/L (ref 22–32)
Calcium: 8.9 mg/dL (ref 8.9–10.3)
Chloride: 105 mmol/L (ref 98–111)
Creatinine, Ser: 1.25 mg/dL — ABNORMAL HIGH (ref 0.44–1.00)
GFR, Estimated: 49 mL/min — ABNORMAL LOW (ref >60)
Glucose, Bld: 92 mg/dL (ref 70–99)
Potassium: 3.5 mmol/L (ref 3.5–5.1)
Sodium: 138 mmol/L (ref 135–145)
Total Bilirubin: 0.3 mg/dL (ref 0.3–1.2)
Total Protein: 5.8 g/dL — ABNORMAL LOW (ref 6.5–8.1)

## 2021-05-21 LAB — CBC
HCT: 30.4 % — ABNORMAL LOW (ref 36.0–46.0)
Hemoglobin: 9.8 g/dL — ABNORMAL LOW (ref 12.0–15.0)
MCH: 28.9 pg (ref 26.0–34.0)
MCHC: 32.2 g/dL (ref 30.0–36.0)
MCV: 89.7 fL (ref 80.0–100.0)
Platelets: 370 10*3/uL (ref 150–400)
RBC: 3.39 MIL/uL — ABNORMAL LOW (ref 3.87–5.11)
RDW: 14.5 % (ref 11.5–15.5)
WBC: 7.4 10*3/uL (ref 4.0–10.5)
nRBC: 0 % (ref 0.0–0.2)

## 2021-05-21 MED ORDER — CYCLOBENZAPRINE HCL 5 MG PO TABS
5.0000 mg | ORAL_TABLET | Freq: Three times a day (TID) | ORAL | Status: DC | PRN
  Administered 2021-05-21 – 2021-05-23 (×2): 5 mg via ORAL
  Filled 2021-05-21 (×2): qty 1

## 2021-05-21 MED ORDER — HYDRALAZINE HCL 20 MG/ML IJ SOLN
10.0000 mg | INTRAMUSCULAR | Status: DC | PRN
Start: 2021-05-21 — End: 2021-05-22
  Filled 2021-05-21: qty 1

## 2021-05-21 NOTE — Progress Notes (Signed)
Pt c/o pain. PRN tylenol and oxy given with positive results. Pt BP elevated BP (!) 147/101   Pulse 72   Temp 98.4 ?F (36.9 ?C) (Oral)   Resp 16   Ht '5\' 2"'$  (1.575 m)   Wt 74.6 kg   SpO2 95%   BMI 30.08 kg/m?  ? ?

## 2021-05-21 NOTE — Plan of Care (Signed)

## 2021-05-21 NOTE — Progress Notes (Signed)
?  Progress Note ? ? ?Patient: Tiffany [name] PRX:[id number] DOB: Apr 15, 1960 DOA: 05/19/2021     1 ?DOS: the patient was seen and examined on 05/21/2021 ?  ?Brief hospital course: ?61 y.o. female with medical history significant for coronary artery disease, carotid artery disease, schizophrenia, PTSD, history of cardiac arrest and anoxic brain injury, gout, anemia of chronic disease, GERD, diverticulosis, OSA, chronic pain syndrome, COPD, chronic diastolic CHF, CKD 3B, who presented to [facility] ED from home due to generalized weakness and severe diarrhea of 1 week duration.  Associated with generalized body aches.  Patient was recently admitted on 04/24/2021 and discharged on 04/30/2021 to SNF after being treated for low back pain and seen by neurosurgery.  EMS was activated from her home where she lives alone.  She was found incontinent of watery green-colored stools.  She was brought to the ED profile evaluation.   ?  ?In the ED, she was noted to have light brown watery stool with bright red blood in it. Hemoccult positive ? ?Assessment and Plan: ?No notes have been filed under this hospital service. ?Service: Hospitalist ? ?Acute diarrhea x1 week, rule out infective process. ?Given gentle IV fluid hydration ?Cdiff neg, GI panel neg ?Diarrhea has since resolved ?Pt reports hx of diarrhea over past year, attributes to hx of IBS. Most recent episode lasting over 2 weeks ?  ?Transient GI bleed with positive FOBT ?Appears to have resolved ?Holding off pharmacological DVT prophylaxis due to reported transient GI bleed. ?History of diverticulosis in the entire colon, seen on colonoscopy done on 04/01/2021. ?Hgb currently 9.8, no evidence of acute blood loss ?  ?CKD 3B ?Close to baseline creatinine ?Avoid dehydration with ongoing diarrhea ?Avoid nephrotoxic agents. ?Cr improved to 1.25 ?Monitor urine output and renal function. ?  ?Essential hypertension, BP not at goal, elevated ?Resumed home Coreg ?BP stable ?  ?Chronic  anxiety/depression ?Resume home duloxetine ?  ?Schizophrenia/PTSD ?Resume home regimen ?  ?Generalized weakness ?Thus far, therapy recs for SNF ?TOC consulted ?  ?  ? ?Subjective: Reports feeling better this afternoon ? ?Physical Exam: ?Vitals:  ? 05/21/21 0818 05/21/21 4628 05/21/21 0956 05/21/21 1334  ?BP:  (!) 175/103 (!) 147/98 (!) 156/80  ?Pulse:  86 78 80  ?Resp: 15 16    ?Temp:   98.2 ?F (36.8 ?C) 98.4 ?F (36.9 ?C)  ?TempSrc:   Oral Oral  ?SpO2:  96%  98%  ?Weight:      ?Height:      ? ?General exam: Conversant, in no acute distress ?Respiratory system: normal chest rise, clear, no audible wheezing ?Cardiovascular system: regular rhythm, s1-s2 ?Gastrointestinal system: Nondistended, nontender, pos BS ?Central nervous system: No seizures, no tremors ?Extremities: No cyanosis, no joint deformities ?Skin: No rashes, no pallor ?Psychiatry: Affect normal // no auditory hallucinations  ? ?Data Reviewed: ? ?Labs reviewed: Cr 1.25 ? ?Family Communication: Pt in room, family not at bedside. Pt has requested no family members are to be contacted. Only patient is to be updated ? ?Disposition: ?Status is: Inpatient ?Remains inpatient appropriate because: severity of illness ? Planned Discharge Destination: Skilled nursing facility ? ? ? ? ?Author: ?Marylu Lund, MD ?05/21/2021 2:52 PM ? ?For on call review [email or web address].  ?

## 2021-05-22 DIAGNOSIS — K591 Functional diarrhea: Secondary | ICD-10-CM | POA: Diagnosis not present

## 2021-05-22 LAB — COMPREHENSIVE METABOLIC PANEL
ALT: 27 U/L (ref 0–44)
AST: 31 U/L (ref 15–41)
Albumin: 2.8 g/dL — ABNORMAL LOW (ref 3.5–5.0)
Alkaline Phosphatase: 135 U/L — ABNORMAL HIGH (ref 38–126)
Anion gap: 6 (ref 5–15)
BUN: 19 mg/dL (ref 8–23)
CO2: 26 mmol/L (ref 22–32)
Calcium: 9.1 mg/dL (ref 8.9–10.3)
Chloride: 106 mmol/L (ref 98–111)
Creatinine, Ser: 1.21 mg/dL — ABNORMAL HIGH (ref 0.44–1.00)
GFR, Estimated: 51 mL/min — ABNORMAL LOW (ref >60)
Glucose, Bld: 88 mg/dL (ref 70–99)
Potassium: 3.4 mmol/L — ABNORMAL LOW (ref 3.5–5.1)
Sodium: 138 mmol/L (ref 135–145)
Total Bilirubin: 0.3 mg/dL (ref 0.3–1.2)
Total Protein: 5.6 g/dL — ABNORMAL LOW (ref 6.5–8.1)

## 2021-05-22 LAB — CBC
HCT: 30.2 % — ABNORMAL LOW (ref 36.0–46.0)
Hemoglobin: 9.4 g/dL — ABNORMAL LOW (ref 12.0–15.0)
MCH: 28.8 pg (ref 26.0–34.0)
MCHC: 31.1 g/dL (ref 30.0–36.0)
MCV: 92.6 fL (ref 80.0–100.0)
Platelets: 318 10*3/uL (ref 150–400)
RBC: 3.26 MIL/uL — ABNORMAL LOW (ref 3.87–5.11)
RDW: 14.4 % (ref 11.5–15.5)
WBC: 7.4 10*3/uL (ref 4.0–10.5)
nRBC: 0 % (ref 0.0–0.2)

## 2021-05-22 LAB — GLUCOSE, CAPILLARY
Glucose-Capillary: 90 mg/dL (ref 70–99)
Glucose-Capillary: 109 mg/dL — ABNORMAL HIGH (ref 70–99)
Glucose-Capillary: 150 mg/dL — ABNORMAL HIGH (ref 70–99)

## 2021-05-22 MED ORDER — OXYCODONE-ACETAMINOPHEN 5-325 MG PO TABS
1.0000 | ORAL_TABLET | Freq: Four times a day (QID) | ORAL | Status: DC | PRN
  Administered 2021-05-23 – 2021-06-01 (×2): 1 via ORAL
  Filled 2021-05-22 (×3): qty 1

## 2021-05-22 MED ORDER — DIPHENOXYLATE-ATROPINE 2.5-0.025 MG PO TABS
1.0000 | ORAL_TABLET | Freq: Once | ORAL | Status: AC
  Administered 2021-05-22: 1 via ORAL
  Filled 2021-05-22: qty 1

## 2021-05-22 MED ORDER — POTASSIUM CHLORIDE CRYS ER 20 MEQ PO TBCR
60.0000 meq | EXTENDED_RELEASE_TABLET | Freq: Once | ORAL | Status: AC
  Administered 2021-05-22: 60 meq via ORAL
  Filled 2021-05-22: qty 3

## 2021-05-22 MED ORDER — LABETALOL HCL 5 MG/ML IV SOLN
5.0000 mg | INTRAVENOUS | Status: DC | PRN
  Administered 2021-05-22: 5 mg via INTRAVENOUS
  Filled 2021-05-22: qty 4

## 2021-05-22 MED ORDER — LOPERAMIDE HCL 2 MG PO CAPS: 2.0000 mg | ORAL_CAPSULE | ORAL | Status: DC | PRN

## 2021-05-22 MED ORDER — MORPHINE SULFATE ER 15 MG PO TBCR
15.0000 mg | EXTENDED_RELEASE_TABLET | Freq: Two times a day (BID) | ORAL | Status: DC
  Administered 2021-05-22 – 2021-06-02 (×23): 15 mg via ORAL
  Filled 2021-05-22 (×23): qty 1

## 2021-05-22 MED ORDER — THIAMINE HCL 100 MG PO TABS
100.0000 mg | ORAL_TABLET | Freq: Every day | ORAL | Status: DC
  Administered 2021-05-22 – 2021-06-02 (×12): 100 mg via ORAL
  Filled 2021-05-22 (×12): qty 1

## 2021-05-22 MED ORDER — PREGABALIN 75 MG PO CAPS
75.0000 mg | ORAL_CAPSULE | Freq: Two times a day (BID) | ORAL | Status: DC
  Administered 2021-05-22 – 2021-06-02 (×23): 75 mg via ORAL
  Filled 2021-05-22 (×23): qty 1

## 2021-05-22 NOTE — Progress Notes (Signed)
BP (!) 160/98 (BP Location: Right Arm)   Pulse 75   Temp 98.3 ?F (36.8 ?C) (Oral)   Resp 18   Ht '5\' 2"'$  (1.575 m)   Wt 74.6 kg   SpO2 93%   BMI 30.08 kg/m?  ?PRN labetalol ordered IV.given awaiting results ?Louanne Skye ? ?

## 2021-05-22 NOTE — Evaluation (Signed)
Physical Therapy Evaluation Patient Details Name: Tiffany [name] MRN: [id number] DOB: 20-Nov-1960 Today's Date: 05/22/2021  History of Present Illness  Patient is a 61 year old female with recent D/C from [facility] with cauda equina syndrome. Plan was to D/C to rehab however patient left AMA and has been at home since April 20th. EMS called 5/4 and patient presenting with generalized weakness, generalized body aches, abdominal pain, vomiting and diarrhea x1 week. PMH: coronary artery disease, carotid artery disease, schizophrenia, PTSD, history of cardiac arrest and anoxic brain injury, gout, anemia of chronic disease, GERD, diverticulosis, OSA, chronic pain syndrome, COPD, chronic diastolic CHF, CKD 3B    Clinical Impression  Tiffany [name] is 61 y.o. female admitted with above HPI and diagnosis. Patient is currently limited by functional impairments below (see PT problem list). Patient lives with her daughter and mother and was independent with mobility PTA in December 2022 and has been declining in function/independence since. Currently she requires Max Assist for bed mobility and would require lift/2+ assist to transfer OOB. Patient will benefit from continued skilled PT interventions to address impairments and progress independence with mobility, recommending rehab at [facility]. Acute PT will follow and progress as able.        Recommendations for follow up therapy are one component of a multi-disciplinary discharge planning process, led by the attending physician.  Recommendations may be updated based on patient status, additional functional criteria and insurance authorization.  Follow Up Recommendations Skilled nursing-short term rehab (<3 hours/day)    Assistance Recommended at Discharge Frequent or constant Supervision/Assistance  Patient can return home with the following  Two people to help with walking and/or transfers;Two people to help with bathing/dressing/bathroom;Help with stairs or ramp for  entrance;Assistance with cooking/housework;Direct supervision/assist for financial management;Assist for transportation;Direct supervision/assist for medications management;Assistance with feeding    Equipment Recommendations Other (comment)  Recommendations for Other Services       Functional Status Assessment Patient has had a recent decline in their functional status and/or demonstrates limited ability to make significant improvements in function in a reasonable and predictable amount of time     Precautions / Restrictions Precautions Precautions: Fall Precaution Comments: back precautions due to cauda equina Restrictions Weight Bearing Restrictions: No      Mobility  Bed Mobility Overal bed mobility: Needs Assistance Bed Mobility: Supine to Sit, Sit to Supine     Supine to sit: Max assist, HOB elevated Sit to supine: Max assist, HOB elevated   General bed mobility comments: MaxAssist required to bring LE's off/on EOB and to press trunk upright. Pt attempted to reach and grip bed rail with cues from therapist. Max assist to maintain balance at EOB with assist to place bil UE's at side on bed. Max assist to return to supine and 2+ to boost superiorly in bed.    Transfers                        Ambulation/Gait                  Stairs            Wheelchair Mobility    Modified Rankin (Stroke Patients Only)       Balance Overall balance assessment: Needs assistance Sitting-balance support: Bilateral upper extremity supported, Feet supported Sitting balance-Leahy Scale: Poor Sitting balance - Comments: reliant on external support of therapist to sit EOB with bil UE support pt leaning Rt and unbale to fully lift  Rt elbow off EOB Postural control: Right lateral lean                                   Pertinent Vitals/Pain Pain Assessment Pain Assessment: Faces Faces Pain Scale: Hurts even more Pain Location: back Pain  Descriptors / Indicators: Discomfort, Grimacing Pain Intervention(s): Limited activity within patient's tolerance, Monitored during session, Repositioned    Home Living Family/patient expects to be discharged to:: Unsure Living Arrangements: Alone Available Help at Discharge: Personal care attendant;Family;Available PRN/intermittently (aide 7 days a week 3-4 hr/day; mother also assisted) Type of Home: Apartment Home Access: Level entry       Home Layout: One level Home Equipment: Rollator (4 wheels);BSC/3in1;Electric scooter      Prior Function Prior Level of Function : Needs assist             Mobility Comments: Per chart review OT eval: patient was not able to transfer and mobilize. This sesison pt claims she was and that her mother is helping her and can do it alone. ADLs Comments: Per chart review patient had not been eating or drinking at home, patient states was unable to transfer to bedside commode. Patient having significant difficulty taking care of herself.     Hand Dominance   Dominant Hand: Right    Extremity/Trunk Assessment   Upper Extremity Assessment Upper Extremity Assessment: Defer to OT evaluation    Lower Extremity Assessment Lower Extremity Assessment: RLE deficits/detail;LLE deficits/detail RLE Deficits / Details: PROM WFL; Profoundly weak; 1/5 for quad and hamstring activation, 0/5 for other muscle groups. RLE Sensation: decreased light touch;decreased proprioception RLE Coordination: decreased fine motor;decreased gross motor LLE Deficits / Details: PROM WFL; Profoundly weak; 1/5 for quad and hamstring activation, 0/5 for other muscle groups. LLE Sensation: decreased light touch;decreased proprioception LLE Coordination: decreased fine motor;decreased gross motor    Cervical / Trunk Assessment Cervical / Trunk Assessment: Kyphotic  Communication   Communication: No difficulties  Cognition Arousal/Alertness: Awake/alert Behavior During  Therapy: Anxious, Flat affect Overall Cognitive Status: Impaired/Different from baseline Area of Impairment: Following commands, Safety/judgement                   Current Attention Level: Sustained   Following Commands: Follows one step commands inconsistently, Follows one step commands with increased time Safety/Judgement: Decreased awareness of safety, Decreased awareness of deficits   Problem Solving: Slow processing, Decreased initiation, Requires verbal cues, Requires tactile cues, Difficulty sequencing General Comments: pt with flat affect and seems to be in denial some of current assist requirements and impairments        General Comments      Exercises     Assessment/Plan    PT Assessment Patient needs continued PT services  PT Problem List Decreased strength;Decreased range of motion;Decreased activity tolerance;Decreased balance;Decreased mobility;Decreased coordination;Decreased cognition;Decreased knowledge of use of DME;Decreased safety awareness;Pain       PT Treatment Interventions DME instruction;Functional mobility training;Therapeutic activities;Therapeutic exercise;Balance training;Neuromuscular re-education;Cognitive remediation;Patient/family education;Wheelchair mobility training    PT Goals (Current goals can be found in the Care Plan section)  Acute Rehab PT Goals Patient Stated Goal: "I want to move more" PT Goal Formulation: With patient Time For Goal Achievement: 06/05/21 Potential to Achieve Goals: Fair    Frequency Min 2X/week     Co-evaluation               AM-PAC PT "6 Clicks" Mobility  Outcome  Measure Help needed turning from your back to your side while in a flat bed without using bedrails?: Total Help needed moving from lying on your back to sitting on the side of a flat bed without using bedrails?: Total Help needed moving to and from a bed to a chair (including a wheelchair)?: Total Help needed standing up from a chair  using your arms (e.g., wheelchair or bedside chair)?: Total Help needed to walk in hospital room?: Total Help needed climbing 3-5 steps with a railing? : Total 6 Click Score: 6    End of Session   Activity Tolerance: Patient tolerated treatment well Patient left: in bed;with call bell/phone within reach;with bed alarm set Nurse Communication: Mobility status PT Visit Diagnosis: Muscle weakness (generalized) (M62.81);History of falling (Z91.81);Difficulty in walking, not elsewhere classified (R26.2) Pain - part of body: Leg (back)    Time: 1610-9604 PT Time Calculation (min) (ACUTE ONLY): 24 min   Charges:   PT Evaluation $PT Eval Moderate Complexity: 1 Mod PT Treatments $Therapeutic Activity: 8-22 mins        Q   Anitra Lauth 05/22/2021, 4:47 PM

## 2021-05-22 NOTE — Progress Notes (Signed)
?  Progress Note ? ? ?Patient: Tiffany [name] GUR:[id number] DOB: 1960-03-04 DOA: 05/19/2021     2 ?DOS: the patient was seen and examined on 05/22/2021 ?  ?Brief hospital course: ?61 y.o. female with medical history significant for coronary artery disease, carotid artery disease, schizophrenia, PTSD, history of cardiac arrest and anoxic brain injury, gout, anemia of chronic disease, GERD, diverticulosis, OSA, chronic pain syndrome, COPD, chronic diastolic CHF, CKD 3B, who presented to [facility] ED from home due to generalized weakness and severe diarrhea of 1 week duration.  Associated with generalized body aches.  Patient was recently admitted on 04/24/2021 and discharged on 04/30/2021 to SNF after being treated for low back pain and seen by neurosurgery.  EMS was activated from her home where she lives alone.  She was found incontinent of watery green-colored stools.  She was brought to the ED profile evaluation.   ?  ?In the ED, she was noted to have light brown watery stool with bright red blood in it. Hemoccult positive ? ?Assessment and Plan: ?No notes have been filed under this hospital service. ?Service: Hospitalist ? ?Acute diarrhea x1 week, rule out infective process. ?Given gentle IV fluid hydration ?Cdiff neg, GI panel neg ?Diarrhea has since resolved ?Pt reports hx of diarrhea over past year, attributes to hx of IBS. Most recent episode lasting over 2 weeks ?Cont on anti-diarrheal as needed ?  ?Transient GI bleed with positive FOBT ?Appears to have resolved ?Holding off pharmacological DVT prophylaxis due to reported transient GI bleed. ?History of diverticulosis in the entire colon, seen on colonoscopy done on 04/01/2021. ?Hgb currently stable at 9.4 ?  ?CKD 3B ?Close to baseline creatinine ?Avoid dehydration with ongoing diarrhea ?Avoid nephrotoxic agents. ?Cr improved to 1.21 ?Monitor urine output and renal function. ?  ?Essential hypertension, BP not at goal, elevated ?Resumed home Coreg ?BP remains stable ?   ?Chronic anxiety/depression ?Cont home duloxetine ?  ?Schizophrenia/PTSD ?Resume home regimen ?  ?Generalized weakness ?Thus far, therapy recs for SNF ?TOC following ?  ?  ? ?Subjective: Complains of diarrhea this AM ? ?Physical Exam: ?Vitals:  ? 05/22/21 0600 05/22/21 0824 05/22/21 1323 05/22/21 1600  ?BP: (!) 160/98 (!) 180/100 113/84 121/85  ?Pulse: 75  72   ?Resp: 18  16   ?Temp: 98.3 ?F (36.8 ?C)  98.2 ?F (36.8 ?C)   ?TempSrc: Oral  Oral   ?SpO2: 93%  97%   ?Weight:      ?Height:      ? ?General exam: Awake, laying in bed, in nad ?Respiratory system: Normal respiratory effort, no wheezing ?Cardiovascular system: regular rate, s1, s2 ?Gastrointestinal system: Soft, nondistended, positive BS ?Central nervous system: CN2-12 grossly intact, strength intact ?Extremities: Perfused, no clubbing ?Skin: Normal skin turgor, no notable skin lesions seen ?Psychiatry: Mood normal // no visual hallucinations  ? ?Data Reviewed: ? ?Labs reviewed: Cr 1.21 ? ?Family Communication: Pt in room, family not at bedside. Pt has requested no family members are to be contacted. Only patient is to be updated ? ?Disposition: ?Status is: Inpatient ?Remains inpatient appropriate because: severity of illness ? Planned Discharge Destination: Skilled nursing facility ? ? ? ? ?Author: ?Marylu Lund, MD ?05/22/2021 4:16 PM ? ?For on call review [email or web address]  ?

## 2021-05-23 DIAGNOSIS — F203 Undifferentiated schizophrenia: Secondary | ICD-10-CM | POA: Diagnosis not present

## 2021-05-23 LAB — COMPREHENSIVE METABOLIC PANEL
ALT: 25 U/L (ref 0–44)
AST: 25 U/L (ref 15–41)
Albumin: 2.8 g/dL — ABNORMAL LOW (ref 3.5–5.0)
Alkaline Phosphatase: 131 U/L — ABNORMAL HIGH (ref 38–126)
Anion gap: 5 (ref 5–15)
BUN: 18 mg/dL (ref 8–23)
CO2: 27 mmol/L (ref 22–32)
Calcium: 9.3 mg/dL (ref 8.9–10.3)
Chloride: 105 mmol/L (ref 98–111)
Creatinine, Ser: 1.19 mg/dL — ABNORMAL HIGH (ref 0.44–1.00)
GFR, Estimated: 52 mL/min — ABNORMAL LOW (ref >60)
Glucose, Bld: 92 mg/dL (ref 70–99)
Potassium: 4.3 mmol/L (ref 3.5–5.1)
Sodium: 137 mmol/L (ref 135–145)
Total Bilirubin: 0.5 mg/dL (ref 0.3–1.2)
Total Protein: 5.6 g/dL — ABNORMAL LOW (ref 6.5–8.1)

## 2021-05-23 LAB — CBC
HCT: 32.1 % — ABNORMAL LOW (ref 36.0–46.0)
Hemoglobin: 10.3 g/dL — ABNORMAL LOW (ref 12.0–15.0)
MCH: 29.3 pg (ref 26.0–34.0)
MCHC: 32.1 g/dL (ref 30.0–36.0)
MCV: 91.2 fL (ref 80.0–100.0)
Platelets: 336 10*3/uL (ref 150–400)
RBC: 3.52 MIL/uL — ABNORMAL LOW (ref 3.87–5.11)
RDW: 14.6 % (ref 11.5–15.5)
WBC: 6.3 10*3/uL (ref 4.0–10.5)
nRBC: 0 % (ref 0.0–0.2)

## 2021-05-23 LAB — GLUCOSE, CAPILLARY
Glucose-Capillary: 79 mg/dL (ref 70–99)
Glucose-Capillary: 89 mg/dL (ref 70–99)
Glucose-Capillary: 121 mg/dL — ABNORMAL HIGH (ref 70–99)

## 2021-05-23 LAB — MAGNESIUM: Magnesium: 1.6 mg/dL — ABNORMAL LOW (ref 1.7–2.4)

## 2021-05-23 MED ORDER — DICYCLOMINE HCL 10 MG PO CAPS
10.0000 mg | ORAL_CAPSULE | Freq: Three times a day (TID) | ORAL | Status: DC
  Administered 2021-05-23 – 2021-06-02 (×30): 10 mg via ORAL
  Filled 2021-05-23 (×32): qty 1

## 2021-05-23 MED ORDER — DIAZEPAM 5 MG PO TABS
5.0000 mg | ORAL_TABLET | Freq: Every day | ORAL | Status: DC
  Administered 2021-05-23 – 2021-06-01 (×10): 5 mg via ORAL
  Filled 2021-05-23 (×10): qty 1

## 2021-05-23 MED ORDER — METHOCARBAMOL 500 MG PO TABS
500.0000 mg | ORAL_TABLET | Freq: Three times a day (TID) | ORAL | Status: DC | PRN
  Administered 2021-05-23 – 2021-06-02 (×7): 500 mg via ORAL
  Filled 2021-05-23 (×7): qty 1

## 2021-05-23 MED ORDER — ATORVASTATIN CALCIUM 10 MG PO TABS
20.0000 mg | ORAL_TABLET | Freq: Every day | ORAL | Status: DC
  Administered 2021-05-24 – 2021-06-02 (×10): 20 mg via ORAL
  Filled 2021-05-23 (×11): qty 2

## 2021-05-23 MED ORDER — ASENAPINE MALEATE 5 MG SL SUBL
10.0000 mg | SUBLINGUAL_TABLET | Freq: Two times a day (BID) | SUBLINGUAL | Status: DC
  Administered 2021-05-23 – 2021-06-02 (×20): 10 mg via SUBLINGUAL
  Filled 2021-05-23 (×21): qty 2

## 2021-05-23 MED ORDER — LISDEXAMFETAMINE DIMESYLATE 10 MG PO CAPS: 10.0000 mg | ORAL_CAPSULE | Freq: Every day | ORAL | Status: DC

## 2021-05-23 NOTE — Progress Notes (Signed)
?  Progress Note ? ? ?Patient: Tiffany [name] TDH:[id number] DOB: Mar 20, 1960 DOA: 05/19/2021     3 ?DOS: the patient was seen and examined on 05/23/2021 ?  ?Brief hospital course: ?61 y.o. female with medical history significant for coronary artery disease, carotid artery disease, schizophrenia, PTSD, history of cardiac arrest and anoxic brain injury, gout, anemia of chronic disease, GERD, diverticulosis, OSA, chronic pain syndrome, COPD, chronic diastolic CHF, CKD 3B, who presented to [facility] ED from home due to generalized weakness and severe diarrhea of 1 week duration.  Associated with generalized body aches.  Patient was recently admitted on 04/24/2021 and discharged on 04/30/2021 to SNF after being treated for low back pain and seen by neurosurgery.  EMS was activated from her home where she lives alone.  She was found incontinent of watery green-colored stools.  She was brought to the ED profile evaluation.   ?  ?In the ED, she was noted to have light brown watery stool with bright red blood in it. Hemoccult positive ? ?Assessment and Plan: ?No notes have been filed under this hospital service. ?Service: Hospitalist ? ?Acute diarrhea x1 week, rule out infective process. ?Given gentle IV fluid hydration ?Cdiff neg, GI panel neg ?Diarrhea has since resolved ?Pt reports hx of diarrhea over past year, attributes to hx of IBS. Most recent episode lasting over 2 weeks ?Cont on anti-diarrheal as needed ?  ?Transient GI bleed with positive FOBT ?Appears to have resolved ?Holding off pharmacological DVT prophylaxis due to reported transient GI bleed. ?History of diverticulosis in the entire colon, seen on colonoscopy done on 04/01/2021. ?Hgb currently stable at 10.3 ?  ?CKD 3B ?Close to baseline creatinine ?Avoid dehydration with ongoing diarrhea ?Avoid nephrotoxic agents. ?Cr improved to 1.19 ?Monitor urine output and renal function. ?  ?Essential hypertension, BP not at goal, elevated ?Resumed home Coreg ?BP remains stable ?   ?Chronic anxiety/depression ?Cont home duloxetine ?  ?Schizophrenia/PTSD ?Continue home regimen ?  ?Generalized weakness ?Thus far, therapy recs for SNF ?TOC following ?  ?  ? ?Subjective: Reports continued LE muscle spasms, states symptoms less improved with current flexeril ? ?Physical Exam: ?Vitals:  ? 05/23/21 0500 05/23/21 0543 05/23/21 0825 05/23/21 1215  ?BP:  109/76 121/87 102/61  ?Pulse:  64  77  ?Resp:  12  18  ?Temp:  97.9 ?F (36.6 ?C)  97.7 ?F (36.5 ?C)  ?TempSrc:  Oral  Oral  ?SpO2:  96%  95%  ?Weight: 72.5 kg     ?Height:      ? ?General exam: Conversant, in no acute distress ?Respiratory system: normal chest rise, clear, no audible wheezing ?Cardiovascular system: regular rhythm, s1-s2 ?Gastrointestinal system: Nondistended, nontender, pos BS ?Central nervous system: No seizures, no tremors ?Extremities: No cyanosis, no joint deformities ?Skin: No rashes, no pallor ?Psychiatry: Affect normal // no auditory hallucinations  ? ?Data Reviewed: ? ?Labs reviewed: Cr 1.19 ? ?Family Communication: Pt in room, family not at bedside. Pt has requested no family members are to be contacted. Only patient is to be updated ? ?Disposition: ?Status is: Inpatient ?Remains inpatient appropriate because: severity of illness ? Planned Discharge Destination: Skilled nursing facility ? ? ? ? ?Author: ?Marylu Lund, MD ?05/23/2021 3:48 PM ? ?For on call review [email or web address]  ?

## 2021-05-23 NOTE — Consult Note (Signed)
[facility] CM Inpatient Consult ? ? ?05/23/2021 ? ?Milas Gain Goda ?May 31, 1960 ?[id number] ? ?[facility] CM) ?  ?Patient was reviewed for less than 30 days readmission and extreme high risk score for unplanned readmission. Assessed for post hospital chronic care coordination needs. Per review, patient from home and lives alone. Current PT recommendation is for skilled nursing. ? ?Plan: Will continue to follow for progression and disposition plans.  ? ?Of note, [facility] Care Management services does not replace or interfere with any services that are arranged by inpatient case management or social work.  ? ?Netta Cedars, MSN, RN ?[facility] Liaison ?Toll free office [phone]  ?

## 2021-05-24 DIAGNOSIS — F2081 Schizophreniform disorder: Secondary | ICD-10-CM

## 2021-05-24 LAB — MAGNESIUM: Magnesium: 1.8 mg/dL (ref 1.7–2.4)

## 2021-05-24 MED ORDER — PRAZOSIN HCL 1 MG PO CAPS
2.0000 mg | ORAL_CAPSULE | Freq: Every day | ORAL | Status: DC
  Administered 2021-05-24 – 2021-05-28 (×5): 2 mg via ORAL
  Filled 2021-05-24 (×10): qty 2

## 2021-05-24 NOTE — Progress Notes (Signed)
?  Progress Note ? ? ?Patient: Tiffany [name] PIR:[id number] DOB: 1960-08-08 DOA: 05/19/2021     4 ?DOS: the patient was seen and examined on 05/24/2021 ?  ?Brief hospital course: ?61 y.o. female with medical history significant for coronary artery disease, carotid artery disease, schizophrenia, PTSD, history of cardiac arrest and anoxic brain injury, gout, anemia of chronic disease, GERD, diverticulosis, OSA, chronic pain syndrome, COPD, chronic diastolic CHF, CKD 3B, who presented to [facility] ED from home due to generalized weakness and severe diarrhea of 1 week duration.  Associated with generalized body aches.  Patient was recently admitted on 04/24/2021 and discharged on 04/30/2021 to SNF after being treated for low back pain and seen by neurosurgery.  EMS was activated from her home where she lives alone.  She was found incontinent of watery green-colored stools.  She was brought to the ED profile evaluation.   ?  ?In the ED, she was noted to have light brown watery stool with bright red blood in it. Hemoccult positive ? ?Assessment and Plan: ?No notes have been filed under this hospital service. ?Service: Hospitalist ? ?Acute diarrhea x1 week, rule out infective process. ?Given gentle IV fluid hydration ?Cdiff neg, GI panel neg ?Diarrhea has since resolved ?Pt reports hx of diarrhea over past year, attributes to hx of IBS. Most recent episode lasting over 2 weeks ?Cont on anti-diarrheal as needed ?  ?Transient GI bleed with positive FOBT ?Appears to have resolved ?Holding off pharmacological DVT prophylaxis due to reported transient GI bleed. ?History of diverticulosis in the entire colon, seen on colonoscopy done on 04/01/2021. ?Most recent hgb stable at 10.3 ?  ?CKD 3B ?Close to baseline creatinine ?Avoid dehydration with ongoing diarrhea ?Avoid nephrotoxic agents. ?Cr improved to 1.19 ?  ?Essential hypertension, BP not at goal, elevated ?Resumed home Coreg ?BP stable ?  ?Chronic anxiety/depression ?Cont home duloxetine ?   ?Schizophrenia/PTSD ?Continue home regimen ?Seems stable at this time ?  ?Generalized weakness ?Thus far, therapy recs for SNF ?TOC following ?  ?  ? ?Subjective: Stats LE muscle cramps are better with robaxin ? ?Physical Exam: ?Vitals:  ? 05/24/21 0545 05/24/21 0840 05/24/21 1303 05/24/21 1554  ?BP:  123/81 131/87 119/81  ?Pulse:   74   ?Resp: 15  20   ?Temp:   97.9 ?F (36.6 ?C)   ?TempSrc:   Oral   ?SpO2:   96%   ?Weight:      ?Height:      ? ?General exam: Awake, laying in bed, in nad ?Respiratory system: Normal respiratory effort, no wheezing ?Cardiovascular system: regular rate, s1, s2 ?Gastrointestinal system: Soft, nondistended, positive BS ?Central nervous system: CN2-12 grossly intact, strength intact ?Extremities: Perfused, no clubbing ?Skin: Normal skin turgor, no notable skin lesions seen ?Psychiatry: Mood normal // no visual hallucinations  ? ?Data Reviewed: ? ?There are no new results to review at this time. ? ?Family Communication: Pt in room, family not at bedside. Pt has requested no family members are to be contacted. Only patient is to be updated ? ?Disposition: ?Status is: Inpatient ?Remains inpatient appropriate because: severity of illness ? Planned Discharge Destination: Skilled nursing facility ? ? ? ? ?Author: ?Marylu Lund, MD ?05/24/2021 5:50 PM ? ?For on call review [email or web address]  ?

## 2021-05-24 NOTE — TOC Initial Note (Addendum)
Transition of Care (TOC) - Initial/Assessment Note  ? ? ?Patient Details  ?Name: Tiffany [name] ?MRN: [id number] ?Date of Birth: Jun 16, 1960 ? ?Transition of Care (TOC) CM/SW Contact:    ?Ross Ludwig, LCSW ?Phone Number: ?05/24/2021, 6:24 PM ? ?Clinical Narrative:                 ? ?Patient is a 61 year old female who is alert and oriented x4.  Patient lives alone in an apartment.  Patient was recently at [facility] on 4/17 but signed herself AMA in less then 24 hours.  Patient was set up with [facility] for home health based on chart review, but then it was discontinued.  [name] was informed that PT is recommending SNF placement, [name] spoke to patient and she is agreeable to going to SNF.  Patient will need insurance authorization before she is able to discharge to SNF.  Patient gave [name] permission to begin bed search in [facility].   ? ?Patient currently is being followed by APS worker Lizabeth Leyden [phone].  [name] received phone call that [place] worker will come see patient on Wednesday.   ? ?TOC to keep her updated on patient's progress. ?APS has consents and information can be shared with her.  Per APS worker, she did a home visit last Friday, patient is aware of APS following.   ? ? ?Expected Discharge Plan: [place] ?Barriers to Discharge: Ship broker, Continued Medical Work up ? ? ?Patient Goals and CMS Choice ?Patient states their goals for this hospitalization and ongoing recovery are:: To go to SNF then return back home. ?CMS Medicare.gov Compare Post Acute Care list provided to:: Patient ?Choice offered to / list presented to : Patient ? ?Expected Discharge Plan and Services ?Expected Discharge Plan: [place] ?In-house Referral: Clinical Social Work ?  ?Post Acute Care Choice: [place] ?Living arrangements for the past 2 months: Apartment ?                ?  ?  ?  ?  ?  ?  ?  ?  ?  ?  ? ?Prior Living Arrangements/Services ?Living arrangements  for the past 2 months: Apartment ?Lives with:: Self ?Patient language and need for interpreter reviewed:: Yes ?Do you feel safe going back to the place where you live?: No   Patient feels she need SNF before returning back home.  ?Need for Family Participation in Patient Care: No (Comment) ?Care giver support system in place?: No (comment) ?  ?Criminal Activity/Legal Involvement Pertinent to Current Situation/Hospitalization: No - Comment as needed ? ?Activities of Daily Living ?Home Assistive Devices/Equipment: Eyeglasses, CPAP, Wheelchair, Walker (specify type) ?ADL Screening (condition at time of admission) ?Patient's cognitive ability adequate to safely complete daily activities?: Yes ?Is the patient deaf or have difficulty hearing?: No ?Does the patient have difficulty seeing, even when wearing glasses/contacts?: No ?Does the patient have difficulty concentrating, remembering, or making decisions?: No ?Patient able to express need for assistance with ADLs?: Yes ?Does the patient have difficulty dressing or bathing?: No ?Independently performs ADLs?: No (due to weakness, pt can't walk) ?Communication: Independent ?Dressing (OT): Needs assistance ?Is this a change from baseline?: Pre-admission baseline ?Grooming: Needs assistance ?Is this a change from baseline?: Pre-admission baseline ?Feeding: Independent ?Bathing: Needs assistance ?Is this a change from baseline?: Pre-admission baseline ?Toileting: Needs assistance ?Is this a change from baseline?: Pre-admission baseline ?In/Out Bed: Needs assistance ?Is this a change from baseline?: Pre-admission baseline ?Walks  in Home: Needs assistance ?Is this a change from baseline?: Pre-admission baseline ?Does the patient have difficulty walking or climbing stairs?: Yes (due to weakness) ?Weakness of Legs: Both ?Weakness of Arms/Hands: Both ? ?Permission Sought/Granted ?Permission sought to share information with : Case Manager, Customer service manager, Other  (comment) (APS) ?Permission granted to share information with : Yes, Verbal Permission Granted ? Share Information with NAME: APS worker Lizabeth Leyden [phone] ? Permission granted to share info w AGENCY: SNF admissions ?   ?   ? ?Emotional Assessment ?Appearance:: Appears stated age ?Attitude/Demeanor/Rapport: Engaged ?Affect (typically observed): Calm, Accepting, Pleasant, Stable ?Orientation: : Oriented to Self, Oriented to Place, Oriented to  Time, Oriented to Situation ?  ?Psych Involvement: Yes (comment) ? ?Admission diagnosis:  Diarrhea [R19.7] ?Patient Active Problem List  ? Diagnosis Date Noted  ? Diarrhea 05/20/2021  ? Cauda equina compression ([place]) 04/24/2021  ? Hematochezia   ? Hyperlipidemia 03/31/2021  ? GI bleed 03/30/2021  ? Acute GI bleeding 03/29/2021  ? Atypical chest pain 01/13/2021  ? GERD (gastroesophageal reflux disease)   ? Neuropathy 11/18/2020  ? Pain in both feet 11/18/2020  ? Localized edema 11/18/2020  ? Encounter for medication management 11/17/2020  ? Protein-calorie malnutrition, severe 05/15/2020  ? Malnutrition of moderate degree 05/13/2020  ? Periumbilical abdominal pain   ? Dysphagia   ? Nausea vomiting and diarrhea   ? Generalized abdominal pain   ? E. coli UTI 02/20/2020  ? Acute metabolic encephalopathy 75/17/0017  ? Psychosis ([place]) 02/20/2020  ? Schizophrenia, schizo-affective ([place]) 02/20/2020  ? Metabolic acidosis, normal anion gap (NAG) 02/20/2020  ? Altered mental status   ? Hypocalcemia   ? AKI (acute kidney injury) ([place]) 02/18/2020  ? Pelvic mass in female 01/02/2020  ? Genital herpes 11/25/2019  ? Stage 2 skin ulcer of sacral region ([place]) 10/28/2019  ? Acute renal failure superimposed on stage 3b chronic kidney disease ([place]) 10/27/2019  ? Family discord 02/04/2019  ? Aggressive behavior   ? PTSD (post-traumatic stress disorder) 05/27/2018  ? Schizoaffective disorder, bipolar type ([place]) 04/05/2018  ? Closed displaced fracture of fifth metacarpal bone 03/21/2018  ?  Difficulty with speech 01/24/2018  ? Encephalopathy 11/21/2017  ? Drug abuse ([place]) 11/21/2017  ? Frequent falls 10/11/2017  ? Binge eating disorder   ? Dependence on continuous supplemental oxygen 05/14/2017  ? Gout 04/11/2017  ? Hypomagnesemia   ? Stage 3b chronic kidney disease (CKD) ([place]) 12/15/2016  ? Carotid artery stenosis   ? Hypokalemia   ? Osteoarthritis 10/26/2016  ? Anoxic brain injury ([place]) 09/08/2016  ? Overactive bladder 06/07/2016  ? QT prolongation   ? OSA and COPD overlap syndrome ([place])   ? Arthritis   ? Essential hypertension 03/22/2016  ? Cocaine use disorder, severe, in sustained remission ([place]) 12/17/2015  ? History of drug abuse in remission ([place]) 11/28/2015  ? Hyponatremia 11/25/2015  ? Chronic diastolic congestive heart failure ([place])   ? Chronic respiratory failure with hypoxia ([place]) 06/22/2015  ? Tobacco use disorder 07/22/2014  ? COPD (chronic obstructive pulmonary disease) ([place]) 07/08/2014  ? Seizure ([place]) 01/04/2013  ? Chronic pain syndrome 06/18/2012  ? Dyslipidemia 04/24/2011  ? Anemia 04/24/2011  ? Diabetic neuropathy ([place]) 04/24/2011  ? Morbid obesity ([place]) 10/18/2010  ? Type 2 diabetes mellitus ([place]) 10/18/2010  ? ?PCP:  Lin Landsman, MD ?Pharmacy:   ?[facility] [place] ?[place]  [place] [address] ?Phone: [phone] Fax: [phone] ? ? ? ? ?Social Determinants of Health (SDOH) Interventions ?  ? ?Readmission Risk Interventions ? ?  05/24/2021  ?  6:19 PM 07/26/2020  ?  2:21 PM 02/23/2020  ?  1:16 PM  ?Readmission Risk Prevention Plan  ?Transportation Screening Complete Complete Complete  ?Medication Review (RN Care Manager) Referral to Pharmacy Complete Complete  ?PCP or Specialist appointment within 3-5 days of discharge Complete Complete Complete  ?[place] or Home Care Consult Complete Complete Complete  ?SW Recovery Care/Counseling Consult Complete Complete Complete  ?Palliative Care  Screening  Not Applicable Complete  ?Skilled Nursing Facility Complete Complete Complete  ? ? ? ?

## 2021-05-24 NOTE — [place] FL2 (Addendum)
?[place] MEDICAID FL2 LEVEL OF CARE SCREENING TOOL  ?  ? ?IDENTIFICATION  ?Patient Name: ?Tiffany [name] Birthdate: 1960/10/16 Sex: female Admission Date (Current Location): ?05/19/2021  ?[place] and [place] Number: ? [place] ?[id number] L Facility and Address:  ?[facility],  [place], [place] ?     Provider Number: ?[id number]  ?Attending Physician Name and Address:  ?Donne Hazel, MD ? Relative Name and Phone Number:  ?  ?   ?Current Level of Care: ?Hospital Recommended Level of Care: ?[place] Prior Approval Number: ?  ? ?Date Approved/Denied: ?  PASRR Number: ?[id number] B ? ?Discharge Plan: ?SNF ?  ? ?Current Diagnoses: ?Patient Active Problem List  ? Diagnosis Date Noted  ? Diarrhea 05/20/2021  ? Cauda equina compression ([place]) 04/24/2021  ? Hematochezia   ? Hyperlipidemia 03/31/2021  ? GI bleed 03/30/2021  ? Acute GI bleeding 03/29/2021  ? Atypical chest pain 01/13/2021  ? GERD (gastroesophageal reflux disease)   ? Neuropathy 11/18/2020  ? Pain in both feet 11/18/2020  ? Localized edema 11/18/2020  ? Encounter for medication management 11/17/2020  ? Protein-calorie malnutrition, severe 05/15/2020  ? Malnutrition of moderate degree 05/13/2020  ? Periumbilical abdominal pain   ? Dysphagia   ? Nausea vomiting and diarrhea   ? Generalized abdominal pain   ? E. coli UTI 02/20/2020  ? Acute metabolic encephalopathy 15/17/6160  ? Psychosis ([place]) 02/20/2020  ? Schizophrenia, schizo-affective ([place]) 02/20/2020  ? Metabolic acidosis, normal anion gap (NAG) 02/20/2020  ? Altered mental status   ? Hypocalcemia   ? AKI (acute kidney injury) ([place]) 02/18/2020  ? Pelvic mass in female 01/02/2020  ? Genital herpes 11/25/2019  ? Stage 2 skin ulcer of sacral region ([place]) 10/28/2019  ? Acute renal failure superimposed on stage 3b chronic kidney disease ([place]) 10/27/2019  ? Family discord 02/04/2019  ? Aggressive behavior   ? PTSD (post-traumatic stress disorder) 05/27/2018  ?  Schizoaffective disorder, bipolar type ([place]) 04/05/2018  ? Closed displaced fracture of fifth metacarpal bone 03/21/2018  ? Difficulty with speech 01/24/2018  ? Encephalopathy 11/21/2017  ? Drug abuse ([place]) 11/21/2017  ? Frequent falls 10/11/2017  ? Binge eating disorder   ? Dependence on continuous supplemental oxygen 05/14/2017  ? Gout 04/11/2017  ? Hypomagnesemia   ? Stage 3b chronic kidney disease (CKD) ([place]) 12/15/2016  ? Carotid artery stenosis   ? Hypokalemia   ? Osteoarthritis 10/26/2016  ? Anoxic brain injury ([place]) 09/08/2016  ? Overactive bladder 06/07/2016  ? QT prolongation   ? OSA and COPD overlap syndrome ([place])   ? Arthritis   ? Essential hypertension 03/22/2016  ? Cocaine use disorder, severe, in sustained remission ([place]) 12/17/2015  ? History of drug abuse in remission ([place]) 11/28/2015  ? Hyponatremia 11/25/2015  ? Chronic diastolic congestive heart failure ([place])   ? Chronic respiratory failure with hypoxia ([place]) 06/22/2015  ? Tobacco use disorder 07/22/2014  ? COPD (chronic obstructive pulmonary disease) ([place]) 07/08/2014  ? Seizure ([place]) 01/04/2013  ? Chronic pain syndrome 06/18/2012  ? Dyslipidemia 04/24/2011  ? Anemia 04/24/2011  ? Diabetic neuropathy ([place]) 04/24/2011  ? Morbid obesity ([place]) 10/18/2010  ? Type 2 diabetes mellitus ([place]) 10/18/2010  ? ? ?Orientation RESPIRATION BLADDER Height & Weight   ?  ?Self, Time, Place ? Normal Incontinent Weight: 159 lb 13.3 oz (72.5 kg) ?Height:  '5\' 2"'$  (157.5 cm)  ?BEHAVIORAL SYMPTOMS/MOOD NEUROLOGICAL BOWEL NUTRITION STATUS  ?    Continent Diet Cardiac  ?AMBULATORY STATUS COMMUNICATION  OF NEEDS Skin   ?Limited Assist Verbally Normal ?  ?  ?  ?    ?     ?     ? ? ?Personal Care Assistance Level of Assistance  ?Bathing, Dressing, Feeding Bathing Assistance: Limited assistance ?Feeding assistance: Independent ?Dressing Assistance: Limited assistance ?   ? ?Functional Limitations Info  ?Sight, Hearing, Speech Sight Info: Adequate ?Hearing Info: Adequate ?Speech  Info: Adequate  ? ? ?SPECIAL CARE FACTORS FREQUENCY  ?PT (By licensed PT), OT (By licensed OT)   ?  ?PT Frequency: Minimum 5x a week ?OT Frequency: Minimum 5x a week ?  ?  ?  ?   ? ? ?Contractures Contractures Info: Not present  ? ? ?Additional Factors Info  ?Code Status, Allergies, Psychotropic Code Status Info: Full Code ?Allergies Info: Hydrocodone   Latuda (Lurasidone Hcl)   Magnesium-containing Compounds   Prednisone   Tramadol   Codeine   Trazodone   Topamax (Topiramate)   Sulfa Antibiotics   Tape ?Psychotropic Info: diazepam (VALIUM) tablet 5 mg ?  ?  ?   ? ?Current Medications (05/24/2021):  This is the current hospital active medication list ?Current Facility-Administered Medications  ?Medication Dose Route Frequency Provider Last Rate Last Admin  ? acetaminophen (TYLENOL) tablet 650 mg  650 mg Oral Q6H PRN Irene Pap N, DO   650 mg at 05/24/21 0546  ? albuterol (PROVENTIL) (2.5 MG/3ML) 0.083% nebulizer solution 2.5 mg  2.5 mg Nebulization Q6H PRN Irene Pap N, DO      ? amLODipine (NORVASC) tablet 10 mg  10 mg Oral Daily I[place], Carole N, DO   10 mg at 05/24/21 0840  ? asenapine (SAPHRIS) sublingual tablet 10 mg  10 mg Sublingual BID Donne Hazel, MD   10 mg at 05/24/21 8242  ? atorvastatin (LIPITOR) tablet 20 mg  20 mg Oral Daily Donne Hazel, MD   20 mg at 05/24/21 3536  ? carvedilol (COREG) tablet 6.25 mg  6.25 mg Oral BID WC Hall, Carole N, DO   6.25 mg at 05/24/21 1553  ? diazepam (VALIUM) tablet 5 mg  5 mg Oral QHS Donne Hazel, MD   5 mg at 05/23/21 2023  ? dicyclomine (BENTYL) capsule 10 mg  10 mg Oral TID Franchot Gallo, MD   10 mg at 05/24/21 1553  ? DULoxetine (CYMBALTA) DR capsule 30 mg  30 mg Oral Daily [place], Archie Patten N, DO   30 mg at 05/24/21 1443  ? labetalol (NORMODYNE) injection 5 mg  5 mg Intravenous Q2H PRN Rise Patience, MD   5 mg at 05/22/21 0703  ? lisdexamfetamine (VYVANSE) capsule 10 mg  10 mg Oral Q1200 Donne Hazel, MD      ? loperamide (IMODIUM) capsule 2 mg   2 mg Oral PRN Donne Hazel, MD      ? methocarbamol (ROBAXIN) tablet 500 mg  500 mg Oral Q8H PRN Donne Hazel, MD   500 mg at 05/24/21 0546  ? morphine (MS CONTIN) 12 hr tablet 15 mg  15 mg Oral Q12H Donne Hazel, MD   15 mg at 05/24/21 0840  ? oxyCODONE (Oxy IR/ROXICODONE) immediate release tablet 5 mg  5 mg Oral Q4H PRN Donne Hazel, MD   5 mg at 05/24/21 0546  ? oxyCODONE-acetaminophen (PERCOCET/ROXICET) 5-325 MG per tablet 1 tablet  1 tablet Oral Q6H PRN Donne Hazel, MD   1 tablet at 05/23/21 1005  ? prazosin (MINIPRESS) capsule  2 mg  2 mg Oral QHS Donne Hazel, MD      ? pregabalin (LYRICA) capsule 75 mg  75 mg Oral BID Donne Hazel, MD   75 mg at 05/24/21 0840  ? prochlorperazine (COMPAZINE) injection 10 mg  10 mg Intravenous Q6H PRN Irene Pap N, DO      ? terbinafine (LAMISIL) 1 % cream 1 application.  1 application. Topical TID Irene Pap N, DO   1 application. at 05/24/21 1554  ? thiamine tablet 100 mg  100 mg Oral Daily Donne Hazel, MD   100 mg at 05/24/21 0962  ? ? ? ?Discharge Medications: ?Please see discharge summary for a list of discharge medications. ? ?Relevant Imaging Results: ? ?Relevant Lab Results: ? ? ?Additional Information ?SSN [id number] ? ?Ross Ludwig, LCSW ? ? ? ? ?

## 2021-05-24 NOTE — Progress Notes (Signed)
PT Cancellation Note ? ?Patient Details ?Name: Tiffany [name] ?MRN: [id number] ?DOB: [birth year] ? ? ?Cancelled Treatment:     Pt refused any activity this afternoon. Will try back next available time/date. ? ? ?[name] ?05/24/2021, 3:42 PM ?

## 2021-05-25 DIAGNOSIS — R5381 Other malaise: Secondary | ICD-10-CM

## 2021-05-25 DIAGNOSIS — K591 Functional diarrhea: Secondary | ICD-10-CM | POA: Diagnosis not present

## 2021-05-25 DIAGNOSIS — F32A Depression, unspecified: Secondary | ICD-10-CM

## 2021-05-25 LAB — CBC
HCT: 31.8 % — ABNORMAL LOW (ref 36.0–46.0)
Hemoglobin: 9.9 g/dL — ABNORMAL LOW (ref 12.0–15.0)
MCH: 28.9 pg (ref 26.0–34.0)
MCHC: 31.1 g/dL (ref 30.0–36.0)
MCV: 93 fL (ref 80.0–100.0)
Platelets: 267 10*3/uL (ref 150–400)
RBC: 3.42 MIL/uL — ABNORMAL LOW (ref 3.87–5.11)
RDW: 14.5 % (ref 11.5–15.5)
WBC: 7.1 10*3/uL (ref 4.0–10.5)
nRBC: 0 % (ref 0.0–0.2)

## 2021-05-25 LAB — COMPREHENSIVE METABOLIC PANEL
ALT: 20 U/L (ref 0–44)
AST: 24 U/L (ref 15–41)
Albumin: 2.6 g/dL — ABNORMAL LOW (ref 3.5–5.0)
Alkaline Phosphatase: 119 U/L (ref 38–126)
Anion gap: 7 (ref 5–15)
BUN: 33 mg/dL — ABNORMAL HIGH (ref 8–23)
CO2: 26 mmol/L (ref 22–32)
Calcium: 9.1 mg/dL (ref 8.9–10.3)
Chloride: 105 mmol/L (ref 98–111)
Creatinine, Ser: 1.64 mg/dL — ABNORMAL HIGH (ref 0.44–1.00)
GFR, Estimated: 35 mL/min — ABNORMAL LOW (ref >60)
Glucose, Bld: 92 mg/dL (ref 70–99)
Potassium: 5 mmol/L (ref 3.5–5.1)
Sodium: 138 mmol/L (ref 135–145)
Total Bilirubin: 0.6 mg/dL (ref 0.3–1.2)
Total Protein: 5.2 g/dL — ABNORMAL LOW (ref 6.5–8.1)

## 2021-05-25 MED ORDER — SODIUM CHLORIDE 0.9 % IV SOLN
INTRAVENOUS | Status: AC
Start: 2021-05-25 — End: 2021-05-26

## 2021-05-25 NOTE — Assessment & Plan Note (Signed)
-   Continue Saphris, Valium ?

## 2021-05-25 NOTE — Progress Notes (Signed)
?Progress Note ? ? ? ?Tiffany [name]   ?WUJ:[id number]  ?DOB: [birth year]  ?DOA: 05/19/2021     5 ?PCP: Lin Landsman, MD ? ?Initial CC: Diarrhea ? ?Hospital Course: ?[name] is a 61 y.o. female with PMH CAD, carotid artery disease, schizophrenia, PTSD, history of cardiac arrest and anoxic brain injury, gout, anemia of chronic disease, GERD, diverticulosis, OSA, chronic pain syndrome, COPD, chronic diastolic CHF, CKD 3B, who presented to [facility] ED from home due to generalized weakness and severe diarrhea of 1 week duration. Associated with generalized body aches.  Patient was recently admitted on 04/24/2021 and discharged on 04/30/2021 to SNF after being treated for low back pain and seen by neurosurgery.  EMS was activated from her home where she lives alone.  She was found incontinent of watery green-colored stools.  She was brought to the ED profile evaluation.   ?  ?In the ED, she was noted to have light brown watery stool with bright red blood in it. Hemoccult positive ? ?Interval History:  ?No events overnight.  She endorses having 2 episodes of diarrhea since yesterday but overall they have been improving.  Eating lunch when seen today.  Encouraged her to take in liquids more often today as able. ? ?Assessment and Plan: ?* Diarrhea ?Given gentle IV fluid hydration ?Cdiff neg, GI panel neg ?Diarrhea has since resolved ?Pt reports hx of diarrhea over past year, attributes to hx of IBS. Most recent episode lasting over 2 weeks ?Cont on anti-diarrheal as needed ? ?Physical deconditioning ?- Working with PT ?- SNF recommended ? ?Anxiety and depression ?- Continue Cymbalta and Valium ? ?Schizophrenia ([place]) ?- Continue Saphris, Valium ? ?Chronic kidney disease, stage 3b ([place]) ?- patient has history of CKD3b. Baseline creat ~ 1.4, eGFR 34 ? ?Essential hypertension ?- Continue amlodipine, Coreg, prazosin ? ? ? ?Old records reviewed in assessment of this patient ? ?Antimicrobials: ? ? ?DVT prophylaxis:  ?Place and maintain  sequential compression device Start: 05/20/21 1449 ? ? ?Code Status:   Code Status: Full Code ? ?Disposition Plan: SNF ?Status is: Inpatient ? ?Objective: ?Blood pressure 131/82, pulse 83, temperature 98.4 ?F (36.9 ?C), temperature source Oral, resp. rate 14, height _0  (1.575 m), weight 74.6 kg, SpO2 92 %.  ?Examination:  ?Physical Exam ?Constitutional:   ?   General: [name] not in acute distress. ?HENT:  ?   Head: Normocephalic and atraumatic.  ?   Mouth/Throat:  ?   Mouth: Mucous membranes are moist.  ?Eyes:  ?   Extraocular Movements: Extraocular movements intact.  ?Cardiovascular:  ?   Rate and Rhythm: Normal rate and regular rhythm.  ?Pulmonary:  ?   Effort: Pulmonary effort is normal.  ?   Breath sounds: Normal breath sounds.  ?Abdominal:  ?   Palpations: Abdomen is soft.  ?Musculoskeletal:     ?   General: Normal range of motion.  ?   Cervical back: Normal range of motion and neck supple.  ?Skin: ?   General: Skin is warm.  ?Neurological:  ?   General: No focal deficit present.  ?   Mental Status: [name] alert.  ?Psychiatric:     ?   Mood and Affect: Mood normal.  ?  ? ?Consultants:  ? ? ?Procedures:  ? ? ?Data Reviewed: ?Results for orders placed or performed during the hospital encounter of 05/19/21 (from the past 24 hour(s))  ?Comprehensive metabolic panel     Status: Abnormal  ? Collection Time: 05/25/21  5:27  AM  ?Result Value Ref Range  ? Sodium 138 135 - 145 mmol/L  ? Potassium 5.0 3.5 - 5.1 mmol/L  ? Chloride 105 98 - 111 mmol/L  ? CO2 26 22 - 32 mmol/L  ? Glucose, Bld 92 70 - 99 mg/dL  ? BUN 33 (H) 8 - 23 mg/dL  ? Creatinine, Ser 1.64 (H) 0.44 - 1.00 mg/dL  ? Calcium 9.1 8.9 - 10.3 mg/dL  ? Total Protein 5.2 (L) 6.5 - 8.1 g/dL  ? Albumin 2.6 (L) 3.5 - 5.0 g/dL  ? AST 24 15 - 41 U/L  ? ALT 20 0 - 44 U/L  ? Alkaline Phosphatase 119 38 - 126 U/L  ? Total Bilirubin 0.6 0.3 - 1.2 mg/dL  ? GFR, Estimated 35 (L) >60 mL/min  ? Anion gap 7 5 - 15  ?CBC     Status: Abnormal  ? Collection Time: 05/25/21   8:24 AM  ?Result Value Ref Range  ? WBC 7.1 4.0 - 10.5 K/uL  ? RBC 3.42 (L) 3.87 - 5.11 MIL/uL  ? Hemoglobin 9.9 (L) 12.0 - 15.0 g/dL  ? HCT 31.8 (L) 36.0 - 46.0 %  ? MCV 93.0 80.0 - 100.0 fL  ? MCH 28.9 26.0 - 34.0 pg  ? MCHC 31.1 30.0 - 36.0 g/dL  ? RDW 14.5 11.5 - 15.5 %  ? Platelets 267 150 - 400 K/uL  ? nRBC 0.0 0.0 - 0.2 %  ? ?*Note: Due to a large number of results and/or encounters for the requested time period, some results have not been displayed. A complete set of results can be found in Results Review.  ?  ?I have Reviewed nursing notes, Vitals, and Lab results since pt's last encounter. Pertinent lab results : see above ?I have ordered test including BMP, CBC, Mg ?I have reviewed the last note from staff over past 24 hours ?I have discussed pt's care plan and test results with nursing staff, case manager ? ? LOS: 5 days  ? ?Dwyane Dee, MD ?[facility] ?05/25/2021, 2:50 PM ?

## 2021-05-25 NOTE — Assessment & Plan Note (Signed)
-   Continue amlodipine, Coreg, prazosin ?

## 2021-05-25 NOTE — Plan of Care (Signed)

## 2021-05-25 NOTE — Progress Notes (Signed)
Physical Therapy Treatment Patient Details Name: Tiffany [name] MRN: [id number] DOB: Mar 15, 1960 Today's Date: 05/25/2021   History of Present Illness Patient is a 60 year old female with recent D/C from [facility] with cauda equina syndrome. Plan was to D/C to rehab however patient left AMA and has been at home since April 20th. EMS called 5/4 and patient presenting with generalized weakness, generalized body aches, abdominal pain, vomiting and diarrhea x1 week. PMH: coronary artery disease, carotid artery disease, schizophrenia, PTSD, history of cardiac arrest and anoxic brain injury, gout, anemia of chronic disease, GERD, diverticulosis, OSA, chronic pain syndrome, COPD, chronic diastolic CHF, CKD 3B    PT Comments    Pt seen with OT today due to requiring assist of 2 to safely transfer to edge of bed via log roll/spinal precautions. Pt with poor/zero sitting balance with feet on floor and single UE support. LOB occurring to the Right for ~7 minutes requiring assist to maintain. Pt is not safe to return to her apartment at this time. Continue to recommend SNF once medically stable.   Recommendations for follow up therapy are one component of a multi-disciplinary discharge planning process, led by the attending physician.  Recommendations may be updated based on patient status, additional functional criteria and insurance authorization.  Follow Up Recommendations  Skilled nursing-short term rehab (<3 hours/day)     Assistance Recommended at Discharge Frequent or constant Supervision/Assistance  Patient can return home with the following Two people to help with walking and/or transfers;Two people to help with bathing/dressing/bathroom;Help with stairs or ramp for entrance;Assistance with cooking/housework;Direct supervision/assist for financial management;Assist for transportation;Direct supervision/assist for medications management;Assistance with feeding   Equipment Recommendations  Other (comment)     Recommendations for Other Services       Precautions / Restrictions Precautions Precautions: Fall Precaution Comments: back precautions due to cauda equina Required Braces or Orthoses:  (log roll) Restrictions Weight Bearing Restrictions: No     Mobility  Bed Mobility Overal bed mobility: Needs Assistance Bed Mobility: Supine to Sit, Sit to Supine Rolling: Max assist, +2 for physical assistance, +2 for safety/equipment   Supine to sit: Max assist, +2 for physical assistance, +2 for safety/equipment, HOB elevated Sit to supine: Max assist, +2 for physical assistance, +2 for safety/equipment   General bed mobility comments: Poor sensation in LE's and unable to actively move due to cauda equina syndrome    Transfers                   General transfer comment: unable (unable, requires dependent lift machine)    Ambulation/Gait               General Gait Details:  (non-ambulatory)   Stairs             Wheelchair Mobility    Modified Rankin (Stroke Patients Only)       Balance Overall balance assessment: Needs assistance Sitting-balance support: Feet supported, Single extremity supported, Bilateral upper extremity supported Sitting balance-Leahy Scale: Zero Sitting balance - Comments: Patient is reliant on support of therapists despite multimodal cues, reports increased pain in back in sitting Postural control: Right lateral lean                                  Cognition Arousal/Alertness: Awake/alert Behavior During Therapy: Anxious Overall Cognitive Status: Impaired/Different from baseline Area of Impairment: Following commands, Safety/judgement, Memory  Memory: Decreased short-term memory Following Commands: Follows one step commands with increased time Safety/Judgement: Decreased awareness of safety, Decreased awareness of deficits     General Comments: Pt talked about conditions at home  and how she doesn't care for her mom to be involved in her life, however did say her mom has to help her with everything (transfers,meals, hygiene)        Exercises      General Comments General comments (skin integrity, edema, etc.):  (Pt's heels and legs are free of skin tears and pressure sores)      Pertinent Vitals/Pain Pain Assessment Pain Assessment: 0-10 Pain Score: 10-Worst pain ever Pain Location: back Pain Descriptors / Indicators: Crying (while sitting EOB) Pain Intervention(s): Limited activity within patient's tolerance    Home Living                          Prior Function            PT Goals (current goals can now be found in the care plan section) Acute Rehab PT Goals Patient Stated Goal: "I want to move more"    Frequency    Min 2X/week      PT Plan Current plan remains appropriate    Co-evaluation   Reason for Co-Treatment: For patient/therapist safety;Complexity of the patient's impairments (multi-system involvement) PT goals addressed during session: Mobility/safety with mobility OT goals addressed during session: ADL's and self-care      AM-PAC PT "6 Clicks" Mobility   Outcome Measure  Help needed turning from your back to your side while in a flat bed without using bedrails?: Total Help needed moving from lying on your back to sitting on the side of a flat bed without using bedrails?: Total Help needed moving to and from a bed to a chair (including a wheelchair)?: Total Help needed standing up from a chair using your arms (e.g., wheelchair or bedside chair)?: Total Help needed to walk in hospital room?: Total Help needed climbing 3-5 steps with a railing? : Total 6 Click Score: 6    End of Session   Activity Tolerance: Patient limited by pain Patient left: in bed;with call bell/phone within reach;with bed alarm set Nurse Communication: Mobility status PT Visit Diagnosis: Muscle weakness (generalized) (M62.81);History of  falling (Z91.81);Difficulty in walking, not elsewhere classified (R26.2) Pain - part of body:  (back)     Time: 5573-2202 PT Time Calculation (min) (ACUTE ONLY): 23 min  Charges:  $Therapeutic Activity: 8-22 mins                    Mikel Cella, PTA    [name] 05/25/2021, 12:20 PM

## 2021-05-25 NOTE — Assessment & Plan Note (Addendum)
-   Working with PT ?- SNF recommended; declined initially by insurance; awaiting further dispo planning  ?

## 2021-05-25 NOTE — Assessment & Plan Note (Signed)
>>  ASSESSMENT AND PLAN FOR STAGE 3B CHRONIC KIDNEY DISEASE ([place]) WRITTEN ON 05/25/2021  2:50 PM BY Sabino Gasser, DAVID, MD  - patient has history of CKD3b. Baseline creat ~ 1.4, eGFR 34

## 2021-05-25 NOTE — Assessment & Plan Note (Signed)
Given gentle IV fluid hydration ?Cdiff neg, GI panel neg ?Diarrhea has since resolved ?Pt reports hx of diarrhea over past year, attributes to hx of IBS. Most recent episode lasting over 2 weeks ?Cont on anti-diarrheal as needed ?

## 2021-05-25 NOTE — Assessment & Plan Note (Addendum)
-  patient has history of CKD3b. Baseline creat ~ 1.4, eGFR 34 ?

## 2021-05-25 NOTE — TOC Progression Note (Addendum)
Transition of Care (TOC) - Progression Note  ? ? ?Patient Details  ?Name: Tiffany [name] ?MRN: [id number] ?Date of Birth: 1960/09/25 ? ?Transition of Care (TOC) CM/SW Contact  ?[place], LCSW ?Phone Number: ?05/25/2021, 11:43 AM ? ?Clinical Narrative:    ? ?Pt has one bed offer from [facility]. Met with pt and provided bed offer. [name] asks pt's questions. [name] a little hisitant about facility. [name] explains it is only offer and only other option would be returning home with much less support. [name] explained anticipated DC date is tomorrow. [name] continued to answer pt's questions about SNF process and insurance coverage. Pt ultimately agrees to DC to [facility].  ? ?[name] contacted [facility] to confirm admission tomorrow; awaiting response.  ? ?1340: [name] called [facility]; inquired about bed offer. Liaison explained she would have to discuss further with administration as pt had been there in the past and left AMA. Pt has no other ped offers at this time.  ? ?[name] called and updated APS worker Lizabeth Leyden [phone] ? ?1510: [facility] can accept tomorrow pending auth. [name] initiated [place] on Nav[employer] portal; auth pending ? ?Expected Discharge Plan: [place] ?Barriers to Discharge: Ship broker, Continued Medical Work up ? ?Expected Discharge Plan and Services ?Expected Discharge Plan: [place] ?In-house Referral: Clinical Social Work ?  ?Post Acute Care Choice: [place] ?Living arrangements for the past 2 months: Apartment ?                ?  ?  ?  ?  ?  ?  ?  ?  ?  ?  ? ? ?Social Determinants of Health (SDOH) Interventions ?  ? ?Readmission Risk Interventions ? ?  05/24/2021  ?  6:19 PM 07/26/2020  ?  2:21 PM 02/23/2020  ?  1:16 PM  ?Readmission Risk Prevention Plan  ?Transportation Screening Complete Complete Complete  ?Medication Review (RN Care Manager) Referral to Pharmacy Complete Complete  ?PCP or Specialist appointment within 3-5  days of discharge Complete Complete Complete  ?[place] or Home Care Consult Complete Complete Complete  ?SW Recovery Care/Counseling Consult Complete Complete Complete  ?Palliative Care Screening  Not Applicable Complete  ?Skilled Nursing Facility Complete Complete Complete  ? ? ?

## 2021-05-25 NOTE — Care Management Important Message (Signed)
Important Message ? ?Patient Details IM Letter given to the Patient. ?Name: Tiffany [name] ?MRN: [id number] ?Date of Birth: [birth year] ? ? ?Medicare Important Message Given:  Yes ? ? ? ? ?Kerin Salen ?05/25/2021, 10:44 AM ?

## 2021-05-25 NOTE — Progress Notes (Signed)
Occupational Therapy Treatment ?Patient Details ?Name: Tiffany [name] ?MRN: [id number] ?DOB: [birth year] ?Today's Date: 05/25/2021 ? ? ?History of present illness Patient is a 61 year old female with recent D/C from [facility] with cauda equina syndrome. Plan was to D/C to rehab however patient left AMA and has been at home since April 20th. EMS called 5/4 and patient presenting with generalized weakness, generalized body aches, abdominal pain, vomiting and diarrhea x1 week. PMH: coronary artery disease, carotid artery disease, schizophrenia, PTSD, history of cardiac arrest and anoxic brain injury, gout, anemia of chronic disease, GERD, diverticulosis, OSA, chronic pain syndrome, COPD, chronic diastolic CHF, CKD 3B ?  ?OT comments ? Patient's back pain continues to limit patient ability to participate in therapy. Patient pleasant and reports wanting to work with therapy, however with max +2 to bring to edge of bed patient begins crying and states pain in back is too much. Max encouragement provided to correct posture as patient leans heavily to her R however patient unable to maintain static sitting without mod to max A from therapist. When asked how she tolerated sitting in wheelchair at home patient states it could tilt. Patient with limited short term memory, did not recall conversation with case manager regarding discussion for SNF at D/C. Will continue efforts to progress patient strength, activity tolerance, sitting balance in order to reduce care giver burden for ADL tasks.   ? ?Recommendations for follow up therapy are one component of a multi-disciplinary discharge planning process, led by the attending physician.  Recommendations may be updated based on patient status, additional functional criteria and insurance authorization. ?   ?Follow Up Recommendations ? Skilled nursing-short term rehab (<3 hours/day)  ?  ?Assistance Recommended at Discharge Frequent or constant Supervision/Assistance  ?Patient can return home  with the following ? Two people to help with walking and/or transfers;Two people to help with bathing/dressing/bathroom;Direct supervision/assist for medications management;Direct supervision/assist for financial management;Assist for transportation;Help with stairs or ramp for entrance;Assistance with cooking/housework ?  ?Equipment Recommendations ? None recommended by OT  ?  ?   ?Precautions / Restrictions Precautions ?Precautions: Fall ?Precaution Comments: back precautions due to cauda equina ?Restrictions ?Weight Bearing Restrictions: No  ? ? ?  ? ?Mobility Bed Mobility ?Overal bed mobility: Needs Assistance ?Bed Mobility: Supine to Sit, Sit to Supine ?  ?  ?Supine to sit: Max assist, +2 for physical assistance, +2 for safety/equipment, HOB elevated ?Sit to supine: Max assist, +2 for physical assistance, +2 for safety/equipment ?  ?General bed mobility comments: Patient unable to move LEs 2* cauda equina syndrome and needing max A +2 to elevate trunk from sidelying position using bed pads. Multiple attempts made to have patient support herself and correct posture 2* R lateral lean but patient continues to go down onto her elbow. ?  ? ?Transfers ?  ?  ?  ?  ?  ?  ?  ?  ?  ?General transfer comment: unable ?  ?  ?Balance Overall balance assessment: Needs assistance ?Sitting-balance support: Feet supported, Single extremity supported, Bilateral upper extremity supported ?Sitting balance-Leahy Scale: Zero ?Sitting balance - Comments: Patient is reliant on support of therapists despite multimodal cues, reports increased pain in back in sitting ?Postural control: Right lateral lean ?  ?  ?  ?  ?  ?  ?  ?  ?  ?  ?  ?  ?  ?  ?   ? ? ? ? ? ? ?Cognition Arousal/Alertness: Awake/alert ?Behavior During Therapy: Anxious ?  Overall Cognitive Status: Impaired/Different from baseline ?Area of Impairment: Following commands, Safety/judgement, Memory ?  ?  ?  ?  ?  ?  ?  ?  ?  ?  ?Memory: Decreased short-term memory ?Following  Commands: Follows one step commands with increased time ?Safety/Judgement: Decreased awareness of safety, Decreased awareness of deficits ?  ?  ?General Comments: Patient stating she is always happy to see therapists however limited efforts in sitting reports too much pain. Patient also stating her mother was poisoning her water/food at home. Increased anxiety with mobility. Tried to discuss D/C plans and patient states CM did not speak with her about going to rehab although per chart review CM did yesterday patient does not recall this. Poor memory. ?  ?  ?   ?   ?   ?   ? ? ?Pertinent Vitals/ Pain       Pain Assessment ?Pain Assessment: 0-10 ?Pain Score: 10-Worst pain ever ?Pain Location: back ?Pain Descriptors / Indicators: Crying ?Pain Intervention(s): Limited activity within patient's tolerance ? ?   ?   ? ?Frequency ? Min 2X/week  ? ? ? ? ?  ?Progress Toward Goals ? ?OT Goals(current goals can now be found in the care plan section) ? Progress towards OT goals: Not progressing toward goals - comment (Limited 2* pain) ? ?Acute Rehab OT Goals ?Patient Stated Goal: "I like to work with yal" ?OT Goal Formulation: With patient ?Time For Goal Achievement: 06/03/21 ?Potential to Achieve Goals: Fair ?ADL Goals ?Pt Will Perform Lower Body Dressing: with mod assist;bed level;sitting/lateral leans ?Pt Will Transfer to Toilet: with mod assist;squat pivot transfer;bedside commode ?Additional ADL Goal #1: Patient will demonstrate good sitting balance for 15 minutes in order to participate in self care tasks.  ?Plan Discharge plan remains appropriate   ? ?Co-evaluation ? ? ? PT/OT/SLP Co-Evaluation/Treatment: Yes ?Reason for Co-Treatment: For patient/therapist safety;To address functional/ADL transfers ?PT goals addressed during session: Mobility/safety with mobility ?OT goals addressed during session: ADL's and self-care ?  ? ?  ?AM-PAC OT "6 Clicks" Daily Activity     ?Outcome Measure ? ? Help from another person eating  meals?: A Little ?Help from another person taking care of personal grooming?: A Little ?Help from another person toileting, which includes using toliet, bedpan, or urinal?: Total ?Help from another person bathing (including washing, rinsing, drying)?: A Lot ?Help from another person to put on and taking off regular upper body clothing?: A Lot ?Help from another person to put on and taking off regular lower body clothing?: Total ?6 Click Score: 12 ? ?  ?End of Session  ?OT Visit Diagnosis: Other abnormalities of gait and mobility (R26.89);Muscle weakness (generalized) (M62.81) ?Pain - part of body:  (back) ?  ?Activity Tolerance Patient limited by pain ?  ?Patient Left in bed;with call bell/phone within reach;with bed alarm set ?  ?Nurse Communication Mobility status ?  ? ?   ? ?Time: 1497-0263 ?OT Time Calculation (min): 23 min ? ?Charges: OT General Charges ?$OT Visit: 1 Visit ?OT Treatments ?$Therapeutic Activity: 8-22 mins ? ?Delbert Phenix OT ?OT pager: [phone] ? ? ?Rosemary Holms ?05/25/2021, 11:25 AM ?

## 2021-05-25 NOTE — Assessment & Plan Note (Addendum)
-   Continue Cymbalta and Valium ?

## 2021-05-26 DIAGNOSIS — K591 Functional diarrhea: Secondary | ICD-10-CM | POA: Diagnosis not present

## 2021-05-26 LAB — BASIC METABOLIC PANEL
Anion gap: 4 — ABNORMAL LOW (ref 5–15)
BUN: 33 mg/dL — ABNORMAL HIGH (ref 8–23)
CO2: 28 mmol/L (ref 22–32)
Calcium: 8.9 mg/dL (ref 8.9–10.3)
Chloride: 106 mmol/L (ref 98–111)
Creatinine, Ser: 1.41 mg/dL — ABNORMAL HIGH (ref 0.44–1.00)
GFR, Estimated: 42 mL/min — ABNORMAL LOW (ref >60)
Glucose, Bld: 99 mg/dL (ref 70–99)
Potassium: 3.6 mmol/L (ref 3.5–5.1)
Sodium: 138 mmol/L (ref 135–145)

## 2021-05-26 LAB — MAGNESIUM: Magnesium: 1.7 mg/dL (ref 1.7–2.4)

## 2021-05-26 NOTE — Plan of Care (Signed)

## 2021-05-26 NOTE — TOC Progression Note (Signed)
Transition of Care (TOC) - Progression Note  ? ? ?Patient Details  ?Name: Tiffany [name] ?MRN: [id number] ?Date of Birth: 1960-04-05 ? ?Transition of Care (TOC) CM/SW Contact  ?Brendan Gruwell, Marjie Skiff, RN ?Phone Number: ?05/26/2021, 12:45 PM ? ?Clinical Narrative:    ?SNF [facility] is still pending in [facility] Portal. Call made to [facility] to see if additional clinicals needed. None requested. [facility] is still awaiting review by clinician. ? ? ?Expected Discharge Plan: [place] ?Barriers to Discharge: Ship broker, Continued Medical Work up ? ?Expected Discharge Plan and Services ?Expected Discharge Plan: [place] ?In-house Referral: Clinical Social Work ?  ?Post Acute Care Choice: [place] ?Living arrangements for the past 2 months: Apartment ?                ? Readmission Risk Interventions ? ?  05/24/2021  ?  6:19 PM 07/26/2020  ?  2:21 PM 02/23/2020  ?  1:16 PM  ?Readmission Risk Prevention Plan  ?Transportation Screening Complete Complete Complete  ?Medication Review (RN Care Manager) Referral to Pharmacy Complete Complete  ?PCP or Specialist appointment within 3-5 days of discharge Complete Complete Complete  ?[place] or Home Care Consult Complete Complete Complete  ?SW Recovery Care/Counseling Consult Complete Complete Complete  ?Palliative Care Screening  Not Applicable Complete  ?Skilled Nursing Facility Complete Complete Complete  ? ? ?

## 2021-05-26 NOTE — Progress Notes (Signed)
?Progress Note ? ? ? ?MARKITTA AUSBURN   ?DJT:[id number]  ?DOB: [birth year]  ?DOA: 05/19/2021     6 ?PCP: Lin Landsman, MD ? ?Initial CC: Diarrhea ? ?Hospital Course: ?Ms. Czaplicki is a 61 y.o. female with PMH CAD, carotid artery disease, schizophrenia, PTSD, history of cardiac arrest and anoxic brain injury, gout, anemia of chronic disease, GERD, diverticulosis, OSA, chronic pain syndrome, COPD, chronic diastolic CHF, CKD 3B, who presented to [facility] ED from home due to generalized weakness and severe diarrhea of 1 week duration. Associated with generalized body aches.  Patient was recently admitted on 04/24/2021 and discharged on 04/30/2021 to SNF after being treated for low back pain and seen by neurosurgery.  EMS was activated from her home where she lives alone.  She was found incontinent of watery green-colored stools.  She was brought to the ED profile evaluation.   ?  ?In the ED, she was noted to have light brown watery stool with bright red blood in it. Hemoccult positive ? ?Interval History:  ?No events overnight.  Just finished eating breakfast when seen this morning.  She had no concerns when seen.  We are still awaiting insurance approval for discharge purposes. ? ?Assessment and Plan: ?* Diarrhea ?Given gentle IV fluid hydration ?Cdiff neg, GI panel neg ?Diarrhea has since resolved ?Pt reports hx of diarrhea over past year, attributes to hx of IBS. Most recent episode lasting over 2 weeks ?Cont on anti-diarrheal as needed ? ?Physical deconditioning ?- Working with PT ?- SNF recommended ? ?Anxiety and depression ?- Continue Cymbalta and Valium ? ?Schizophrenia ([place]) ?- Continue Saphris, Valium ? ?Chronic kidney disease, stage 3b ([place]) ?- patient has history of CKD3b. Baseline creat ~ 1.4, eGFR 34 ? ?Essential hypertension ?- Continue amlodipine, Coreg, prazosin ? ? ? ?Old records reviewed in assessment of this patient ? ?Antimicrobials: ? ? ?DVT prophylaxis:  ?Place and maintain sequential compression device Start:  05/20/21 1449 ? ? ?Code Status:   Code Status: Full Code ? ?Disposition Plan: SNF once approved.  Medically stable ?Status is: Inpatient ? ?Objective: ?Blood pressure 111/77, pulse 73, temperature 98.5 ?F (36.9 ?C), temperature source Oral, resp. rate 20, height _0  (1.575 m), weight 74.6 kg, SpO2 93 %.  ?Examination:  ?Physical Exam ?Constitutional:   ?   General: [name] not in acute distress. ?HENT:  ?   Head: Normocephalic and atraumatic.  ?   Mouth/Throat:  ?   Mouth: Mucous membranes are moist.  ?Eyes:  ?   Extraocular Movements: Extraocular movements intact.  ?Cardiovascular:  ?   Rate and Rhythm: Normal rate and regular rhythm.  ?Pulmonary:  ?   Effort: Pulmonary effort is normal.  ?   Breath sounds: Normal breath sounds.  ?Abdominal:  ?   Palpations: Abdomen is soft.  ?Musculoskeletal:     ?   General: Normal range of motion.  ?   Cervical back: Normal range of motion and neck supple.  ?Skin: ?   General: Skin is warm.  ?Neurological:  ?   General: No focal deficit present.  ?   Mental Status: [name] alert.  ?Psychiatric:     ?   Mood and Affect: Mood normal.  ?  ? ?Consultants:  ? ? ?Procedures:  ? ? ?Data Reviewed: ?Results for orders placed or performed during the hospital encounter of 05/19/21 (from the past 24 hour(s))  ?Basic metabolic panel     Status: Abnormal  ? Collection Time: 05/26/21  5:46 AM  ?Result  Value Ref Range  ? Sodium 138 135 - 145 mmol/L  ? Potassium 3.6 3.5 - 5.1 mmol/L  ? Chloride 106 98 - 111 mmol/L  ? CO2 28 22 - 32 mmol/L  ? Glucose, Bld 99 70 - 99 mg/dL  ? BUN 33 (H) 8 - 23 mg/dL  ? Creatinine, Ser 1.41 (H) 0.44 - 1.00 mg/dL  ? Calcium 8.9 8.9 - 10.3 mg/dL  ? GFR, Estimated 42 (L) >60 mL/min  ? Anion gap 4 (L) 5 - 15  ?Magnesium     Status: None  ? Collection Time: 05/26/21  5:46 AM  ?Result Value Ref Range  ? Magnesium 1.7 1.7 - 2.4 mg/dL  ? ?*Note: Due to a large number of results and/or encounters for the requested time period, some results have not been displayed. A  complete set of results can be found in Results Review.  ?  ?I have Reviewed nursing notes, Vitals, and Lab results since pt's last encounter. Pertinent lab results : see above ?I have ordered test including BMP, CBC, Mg ?I have reviewed the last note from staff over past 24 hours ?I have discussed pt's care plan and test results with nursing staff, case manager ? ? LOS: 6 days  ? ?Dwyane Dee, MD ?[facility] ?05/26/2021, 3:35 PM ?

## 2021-05-27 ENCOUNTER — Other Ambulatory Visit: Payer: Self-pay | Admitting: Family Medicine

## 2021-05-27 DIAGNOSIS — R197 Diarrhea, unspecified: Secondary | ICD-10-CM | POA: Diagnosis not present

## 2021-05-27 DIAGNOSIS — R5381 Other malaise: Secondary | ICD-10-CM | POA: Diagnosis not present

## 2021-05-27 NOTE — TOC Progression Note (Signed)
Transition of Care (TOC) - Progression Note  ? ? ?Patient Details  ?Name: Tiffany [name] ?MRN: [id number] ?Date of Birth: 11/02/1960 ? ?Transition of Care (TOC) CM/SW Contact  ?Elio Haden, Marjie Skiff, RN ?Phone Number: ?05/27/2021, 10:51 AM ? ?Clinical Narrative:    ?Additional clinicals requested by [facility]. Clinicals sent as requested. ? ? ?Expected Discharge Plan: [place] ?Barriers to Discharge: Ship broker, Continued Medical Work up ? ?Expected Discharge Plan and Services ?Expected Discharge Plan: [place] ?In-house Referral: Clinical Social Work ?  ?Post Acute Care Choice: [place] ?Living arrangements for the past 2 months: Apartment ?                ?  ?  ?Readmission Risk Interventions ? ?  05/24/2021  ?  6:19 PM 07/26/2020  ?  2:21 PM 02/23/2020  ?  1:16 PM  ?Readmission Risk Prevention Plan  ?Transportation Screening Complete Complete Complete  ?Medication Review (RN Care Manager) Referral to Pharmacy Complete Complete  ?PCP or Specialist appointment within 3-5 days of discharge Complete Complete Complete  ?[place] or Home Care Consult Complete Complete Complete  ?SW Recovery Care/Counseling Consult Complete Complete Complete  ?Palliative Care Screening  Not Applicable Complete  ?Skilled Nursing Facility Complete Complete Complete  ? ? ?

## 2021-05-27 NOTE — Progress Notes (Signed)
Physical Therapy Treatment ?Patient Details ?Name: Tiffany [name] ?MRN: [id number] ?DOB: 11/17/1960 ?Today's Date: 05/27/2021 ? ? ?History of Present Illness Patient is a 61 year old female with recent D/C from [facility] with cauda equina syndrome. Plan was to D/C to rehab however patient left AMA and has been at home since April 20th. EMS called 5/4 and patient presenting with generalized weakness, generalized body aches, abdominal pain, vomiting and diarrhea x1 week. PMH: coronary artery disease, carotid artery disease, schizophrenia, PTSD, history of cardiac arrest and anoxic brain injury, gout, anemia of chronic disease, GERD, diverticulosis, OSA, chronic pain syndrome, COPD, chronic diastolic CHF, CKD 3B ? ?  ?PT Comments  ? ? Pt agreeable to be seen this morning and in fairly good spirits. Pt required max A +2 for bed mobility, mod assist to bring trunk upright to sitting position. Pt able to sit at EOB for 48mn with single UE assist and min guard, no physical assist required but reporting increased pain by end of task, fearful of attempting transfer to recliner and requesting return to supine. Pt very weak in BLE and reporting back pain > LE pain today. Discharge recommendation remains appropriate. We will continue to follow her acutely.  ?   ?Recommendations for follow up therapy are one component of a multi-disciplinary discharge planning process, led by the attending physician.  Recommendations may be updated based on patient status, additional functional criteria and insurance authorization. ? ?Follow Up Recommendations ? Skilled nursing-short term rehab (<3 hours/day) ?  ?  ?Assistance Recommended at Discharge Frequent or constant Supervision/Assistance  ?Patient can return home with the following Two people to help with walking and/or transfers;Two people to help with bathing/dressing/bathroom;Help with stairs or ramp for entrance;Assistance with cooking/housework;Direct supervision/assist for financial  management;Assist for transportation;Direct supervision/assist for medications management;Assistance with feeding ?  ?Equipment Recommendations ? Other (comment) (TBD at SNF)  ?  ?Recommendations for Other Services   ? ? ?  ?Precautions / Restrictions Precautions ?Precautions: Fall ?Precaution Comments: back precautions due to cauda equina ?Required Braces or Orthoses:  (log roll) ?Restrictions ?Weight Bearing Restrictions: No  ?  ? ?Mobility ? Bed Mobility ?Overal bed mobility: Needs Assistance ?Bed Mobility: Supine to Sit, Sit to Supine ?Rolling: Max assist, +2 for physical assistance, +2 for safety/equipment ?  ?Supine to sit: Max assist, +2 for physical assistance, +2 for safety/equipment, HOB elevated ?Sit to supine: Max assist, +2 for physical assistance, +2 for safety/equipment ?  ?General bed mobility comments: pt required Max A +2 for logroll and movement of BLE and trunk. Poor sensation in LEs and unable to actively move due to cauda equina syndrome. Pt required mod assist to come to upright sitting but was able to maintain support. Pt able to sit on EOB with legs dependent for 8 minutes. ?  ? ?Transfers ?  ?  ?  ?  ?  ?  ?  ?  ?  ?General transfer comment: unable (unable, requires dependent lift machine) ?  ? ?Ambulation/Gait ?  ?  ?  ?  ?  ?  ?  ?General Gait Details:  (non-ambulatory) ? ? ?Stairs ?  ?  ?  ?  ?  ? ? ?Wheelchair Mobility ?  ? ?Modified Rankin (Stroke Patients Only) ?  ? ? ?  ?Balance Overall balance assessment: Needs assistance ?Sitting-balance support: Feet supported, Single extremity supported ?Sitting balance-Leahy Scale: Fair ?Sitting balance - Comments: Pt able to sit with BLE on floor and single UE holding bed rail for  8 min without PT assistance. No leaning or overt LOB noted. ?  ?  ?Standing balance-Leahy Scale: Zero ?Standing balance comment: unable ?  ?  ?  ?  ?  ?  ?  ?  ?  ?  ?  ?  ? ?  ?Cognition Arousal/Alertness: Awake/alert ?Behavior During Therapy: Anxious ?Overall  Cognitive Status: Impaired/Different from baseline ?Area of Impairment: Following commands, Safety/judgement, Memory ?  ?  ?  ?  ?  ?  ?  ?  ?  ?Current Attention Level: Sustained ?Memory: Decreased short-term memory, Decreased recall of precautions ?Following Commands: Follows one step commands with increased time ?Safety/Judgement: Decreased awareness of safety, Decreased awareness of deficits ?  ?Problem Solving: Slow processing, Decreased initiation, Requires verbal cues, Requires tactile cues, Difficulty sequencing ?General Comments: Pt generally agreeable, but anxious about getting out of bed ?  ?  ? ?  ?Exercises General Exercises - Lower Extremity ?Ankle Circles/Pumps: AROM, Both, 5 reps, Supine ?Quad Sets: AROM, Both, Other reps (comment), Supine (2) ?Hip Flexion/Marching: AAROM, Both, Seated, Other reps (comment) (Attempted seated marching, pt unable to perform) ? ?  ?General Comments   ?  ?  ? ?Pertinent Vitals/Pain Pain Assessment ?Pain Assessment: 0-10 ?Pain Score: 8  ?Faces Pain Scale: Hurts little more ?Pain Location: back ?Pain Descriptors / Indicators: Crying (while sitting EOB) ?Pain Intervention(s): Limited activity within patient's tolerance, Monitored during session, Repositioned  ? ? ?Home Living   ?  ?  ?  ?  ?  ?  ?  ?  ?  ?   ?  ?Prior Function    ?  ?  ?   ? ?PT Goals (current goals can now be found in the care plan section) Acute Rehab PT Goals ?Patient Stated Goal: "I want to move more" ?PT Goal Formulation: With patient ?Time For Goal Achievement: 06/05/21 ?Potential to Achieve Goals: Fair ?Progress towards PT goals: Not progressing toward goals - comment (Pt anxious about moving and refusing to attempt OOB) ? ?  ?Frequency ? ? ? Min 2X/week ? ? ? ?  ?PT Plan Current plan remains appropriate  ? ? ?Co-evaluation PT/OT/SLP Co-Evaluation/Treatment: Yes ?  ?  ?  ?  ? ?  ?AM-PAC PT "6 Clicks" Mobility   ?Outcome Measure ? Help needed turning from your back to your side while in a flat bed  without using bedrails?: Total ?Help needed moving from lying on your back to sitting on the side of a flat bed without using bedrails?: Total ?Help needed moving to and from a bed to a chair (including a wheelchair)?: Total ?Help needed standing up from a chair using your arms (e.g., wheelchair or bedside chair)?: Total ?Help needed to walk in hospital room?: Total ?Help needed climbing 3-5 steps with a railing? : Total ?6 Click Score: 6 ? ?  ?End of Session   ?Activity Tolerance: Patient limited by pain ?Patient left: in bed;with call bell/phone within reach;with bed alarm set ?Nurse Communication: Mobility status ?PT Visit Diagnosis: Muscle weakness (generalized) (M62.81);History of falling (Z91.81);Difficulty in walking, not elsewhere classified (R26.2) ?Pain - part of body: Leg (back) ?  ? ? ?Time: 2202-5427 ?PT Time Calculation (min) (ACUTE ONLY): 14 min ? ?Charges:  $Therapeutic Activity: 8-22 mins          ?          ? ?Coolidge Breeze, PT, DPT ?[facility] ?Office: [phone] ?Pager: [phone] ? ? ?Coolidge Breeze ?05/27/2021, 10:36 AM ? ?

## 2021-05-27 NOTE — Plan of Care (Signed)

## 2021-05-27 NOTE — Progress Notes (Signed)
?Progress Note ? ? ? ?[name]   ?[id number]  ?DOB: [birth year]  ?DOA: 05/19/2021     7 ?PCP: Lin Landsman, MD ? ?Initial CC: Diarrhea ? ?[name] Course: ?[name] is a 61 y.o. female with PMH CAD, carotid artery disease, schizophrenia, PTSD, history of cardiac arrest and anoxic brain injury, gout, anemia of chronic disease, GERD, diverticulosis, OSA, chronic pain syndrome, COPD, chronic diastolic CHF, CKD 3B, who [name] from [facility] due to generalized weakness and severe diarrhea of 1 week duration. Associated with generalized body aches.  Patient was recently admitted on 04/24/2021 and discharged on 04/30/2021 to SNF after being treated for low back pain and seen by neurosurgery.  EMS was activated from her home where she lives alone.  She was found incontinent of watery green-colored stools.  She was brought to the ED profile evaluation.   ?  ?In the ED, she was noted to have light brown watery stool with bright red blood in it. Hemoccult positive.  There were no further reports of this after admission and hemoglobin remained stable. ? ?Interval History:  ?No events overnight.  Resting in bed comfortably this morning.  No concerns when asked.  Plan still remains for going to rehab at discharge. ? ?Assessment and Plan: ?* Diarrhea-resolved as of 05/27/2021 ?Given gentle IV fluid hydration ?Cdiff neg, GI panel neg ?Diarrhea has since resolved ?Pt reports hx of diarrhea over past year, attributes to hx of IBS. Most recent episode lasting over 2 weeks ?Cont on anti-diarrheal as needed ? ?Physical deconditioning ?- Working with PT ?- SNF recommended ? ?Anxiety and depression ?- Continue Cymbalta and Valium ? ?Schizophrenia ([place]) ?- Continue Saphris, Valium ? ?Chronic kidney disease, stage 3b ([place]) ?- patient has history of CKD3b. Baseline creat ~ 1.4, eGFR 34 ? ?Essential hypertension ?- Continue amlodipine, Coreg, prazosin ? ? ? ?Old records reviewed in assessment of this  patient ? ?Antimicrobials: ? ? ?DVT prophylaxis:  ?Place and maintain sequential compression device Start: 05/20/21 1449 ? ? ?Code Status:   Code Status: Full Code ? ?Disposition Plan: SNF once approved.  Medically stable ?Status is: Inpatient ? ?Objective: ?Blood pressure (!) 125/93, pulse 79, temperature 98.2 ?F (36.8 ?C), temperature source Oral, resp. rate 17, height 5' 2" (1.575 m), weight 75.8 kg, SpO2 97 %.  ?Examination:  ?Physical Exam ?Constitutional:   ?   General: [name] not in acute distress. ?HENT:  ?   Head: Normocephalic and atraumatic.  ?   Mouth/Throat:  ?   Mouth: Mucous membranes are moist.  ?Eyes:  ?   Extraocular Movements: Extraocular movements intact.  ?Cardiovascular:  ?   Rate and Rhythm: Normal rate and regular rhythm.  ?Pulmonary:  ?   Effort: Pulmonary effort is normal.  ?   Breath sounds: Normal breath sounds.  ?Abdominal:  ?   Palpations: Abdomen is soft.  ?Musculoskeletal:     ?   General: Normal range of motion.  ?   Cervical back: Normal range of motion and neck supple.  ?Skin: ?   General: Skin is warm.  ?Neurological:  ?   General: No focal deficit present.  ?   Mental Status: [name] alert.  ?Psychiatric:     ?   Mood and Affect: Mood normal.  ?  ? ?Consultants:  ? ? ?Procedures:  ? ? ?Data Reviewed: ?No results found. However, due to the size of the patient record, not all encounters were searched. Please check Results Review for a  complete set of results. ?  ?I have Reviewed nursing notes, Vitals, and Lab results since pt's last encounter. Pertinent lab results : see above ?I have reviewed the last note from staff over past 24 hours ?I have discussed pt's care plan and test results with nursing staff, case manager ? ? LOS: 7 days  ? ?Dwyane Dee, MD ?[facility] ?05/27/2021, 11:21 AM ?

## 2021-05-28 ENCOUNTER — Other Ambulatory Visit: Payer: Self-pay | Admitting: Family Medicine

## 2021-05-28 DIAGNOSIS — R197 Diarrhea, unspecified: Secondary | ICD-10-CM | POA: Diagnosis not present

## 2021-05-28 NOTE — Progress Notes (Signed)
?Progress Note ? ? ? ?[name]   ?[id number]  ?DOB: [birth year]  ?DOA: 05/19/2021     8 ?PCP: Lin Landsman, MD ? ?Initial CC: Diarrhea ? ?Hospital Course: ?[facility] [name] is a 61 y.o. female with PMH CAD, carotid artery disease, schizophrenia, PTSD, history of cardiac arrest and anoxic brain injury, gout, anemia of chronic disease, GERD, diverticulosis, OSA, chronic pain syndrome, COPD, chronic diastolic CHF, CKD 3B, who presented to [facility] ED from home due to generalized weakness and severe diarrhea of 1 week duration. Associated with generalized body aches.  Patient was recently admitted on 04/24/2021 and discharged on 04/30/2021 to SNF after being treated for low back pain and seen by neurosurgery.  EMS was activated from her home where she lives alone.  She was found incontinent of watery green-colored stools.  She was brought to the ED profile evaluation.   ?  ?In the ED, she was noted to have light brown watery stool with bright red blood in it. Hemoccult positive.  There were no further reports of this after admission and hemoglobin remained stable. ? ?Interval History:  ?No events overnight. Watching tv this morning.  ? ?Assessment and Plan: ?* Diarrhea-resolved as of 05/27/2021 ?Given gentle IV fluid hydration ?Cdiff neg, GI panel neg ?Diarrhea has since resolved ?Pt reports hx of diarrhea over past year, attributes to hx of IBS. Most recent episode lasting over 2 weeks ?Cont on anti-diarrheal as needed ? ?Physical deconditioning ?- Working with PT ?- SNF recommended ? ?Anxiety and depression ?- Continue Cymbalta and Valium ? ?Schizophrenia ([place]) ?- Continue Saphris, Valium ? ?Chronic kidney disease, stage 3b ([place]) ?- patient has history of CKD3b. Baseline creat ~ 1.4, eGFR 34 ? ?Essential hypertension ?- Continue amlodipine, Coreg, prazosin ? ? ? ?Old records reviewed in assessment of this patient ? ?Antimicrobials: ? ? ?DVT prophylaxis:  ?Place and maintain sequential compression device Start: 05/20/21  1449 ? ? ?Code Status:   Code Status: Full Code ? ?Disposition Plan: SNF once approved.  Medically stable ?Status is: Inpatient ? ?Objective: ?Blood pressure 109/61, pulse 73, temperature 98.5 ?F (36.9 ?C), temperature source Oral, resp. rate 15, height '5\' 2"'  (1.575 m), weight 76.6 kg, SpO2 94 %.  ?Examination:  ?Physical Exam ?Constitutional:   ?   General: [name] not in acute distress. ?HENT:  ?   Head: Normocephalic and atraumatic.  ?   Mouth/Throat:  ?   Mouth: Mucous membranes are moist.  ?Eyes:  ?   Extraocular Movements: Extraocular movements intact.  ?Cardiovascular:  ?   Rate and Rhythm: Normal rate and regular rhythm.  ?Pulmonary:  ?   Effort: Pulmonary effort is normal.  ?   Breath sounds: Normal breath sounds.  ?Abdominal:  ?   Palpations: Abdomen is soft.  ?Musculoskeletal:     ?   General: Normal range of motion.  ?   Cervical back: Normal range of motion and neck supple.  ?Skin: ?   General: Skin is warm.  ?Neurological:  ?   General: No focal deficit present.  ?   Mental Status: [name] alert.  ?Psychiatric:     ?   Mood and Affect: Mood normal.  ?  ? ?Consultants:  ? ? ?Procedures:  ? ? ?Data Reviewed: ?No results found. However, due to the size of the patient record, not all encounters were searched. Please check Results Review for a complete set of results. ?  ?I have Reviewed nursing notes, Vitals, and Lab results since pt's last  encounter. Pertinent lab results : see above ?I have reviewed the last note from staff over past 24 hours ?I have discussed pt's care plan and test results with nursing staff, case manager ? ? LOS: 8 days  ? ?Dwyane Dee, MD ?[facility] ?05/28/2021, 6:13 PM ?

## 2021-05-29 DIAGNOSIS — I10 Essential (primary) hypertension: Secondary | ICD-10-CM | POA: Diagnosis not present

## 2021-05-29 DIAGNOSIS — F209 Schizophrenia, unspecified: Secondary | ICD-10-CM

## 2021-05-29 DIAGNOSIS — R197 Diarrhea, unspecified: Secondary | ICD-10-CM | POA: Diagnosis not present

## 2021-05-29 NOTE — Progress Notes (Signed)
?Progress Note ? ? ? ?[name]   ?[id number]  ?DOB: Aug 18, 1960  ?DOA: 05/19/2021     9 ?PCP: Lin Landsman, MD ? ?Initial CC: Diarrhea ? ?Hospital Course: ?[name] is a 61 y.o. female with PMH CAD, carotid artery disease, schizophrenia, PTSD, history of cardiac arrest and anoxic brain injury, gout, anemia of chronic disease, GERD, diverticulosis, OSA, chronic pain syndrome, COPD, chronic diastolic CHF, CKD 3B, who presented to [facility] ED from home due to generalized weakness and severe diarrhea of 1 week duration. Associated with generalized body aches.  Patient was recently admitted on 04/24/2021 and discharged on 04/30/2021 to SNF after being treated for low back pain and seen by neurosurgery.  EMS was activated from her home where she lives alone.  She was found incontinent of watery green-colored stools.  She was brought to the ED profile evaluation.   ?  ?In the ED, she was noted to have light brown watery stool with bright red blood in it. Hemoccult positive.  There were no further reports of this after admission and hemoglobin remained stable. ? ?Interval History:  ?No events overnight. Watching tv this morning.  She understands plan is still for discharging to rehab. ? ?Assessment and Plan: ?* Diarrhea-resolved as of 05/27/2021 ?Given gentle IV fluid hydration ?Cdiff neg, GI panel neg ?Diarrhea has since resolved ?Pt reports hx of diarrhea over past year, attributes to hx of IBS. Most recent episode lasting over 2 weeks ?Cont on anti-diarrheal as needed ? ?Physical deconditioning ?- Working with PT ?- SNF recommended ? ?Anxiety and depression ?- Continue Cymbalta and Valium ? ?Schizophrenia ([place]) ?- Continue Saphris, Valium ? ?Chronic kidney disease, stage 3b ([place]) ?- patient has history of CKD3b. Baseline creat ~ 1.4, eGFR 34 ? ?Essential hypertension ?- Continue amlodipine, Coreg, prazosin ? ? ? ?Old records reviewed in assessment of this patient ? ?Antimicrobials: ? ? ?DVT prophylaxis:  ?Place and  maintain sequential compression device Start: 05/20/21 1449 ? ? ?Code Status:   Code Status: Full Code ? ?Disposition Plan: SNF once approved.  Medically stable ?Status is: Inpatient ? ?Objective: ?Blood pressure 99/65, pulse 75, temperature 98.5 ?F (36.9 ?C), temperature source Oral, resp. rate 18, height '5\' 2"'  (1.575 m), weight 76.6 kg, SpO2 95 %.  ?Examination:  ?Physical Exam ?Constitutional:   ?   General: [name] not in acute distress. ?HENT:  ?   Head: Normocephalic and atraumatic.  ?   Mouth/Throat:  ?   Mouth: Mucous membranes are moist.  ?Eyes:  ?   Extraocular Movements: Extraocular movements intact.  ?Cardiovascular:  ?   Rate and Rhythm: Normal rate and regular rhythm.  ?Pulmonary:  ?   Effort: Pulmonary effort is normal.  ?   Breath sounds: Normal breath sounds.  ?Abdominal:  ?   Palpations: Abdomen is soft.  ?Musculoskeletal:     ?   General: Normal range of motion.  ?   Cervical back: Normal range of motion and neck supple.  ?Skin: ?   General: Skin is warm.  ?Neurological:  ?   General: No focal deficit present.  ?   Mental Status: [name] alert.  ?Psychiatric:     ?   Mood and Affect: Mood normal.  ?  ? ?Consultants:  ? ? ?Procedures:  ? ? ?Data Reviewed: ?No results found. However, due to the size of the patient record, not all encounters were searched. Please check Results Review for a complete set of results. ?  ?I have Reviewed  nursing notes, Vitals, and Lab results since pt's last encounter. Pertinent lab results : see above ?I have reviewed the last note from staff over past 24 hours ?I have discussed pt's care plan and test results with nursing staff, case manager ? ? LOS: 9 days  ? ?Dwyane Dee, MD ?[facility] ?05/29/2021, 2:19 PM ?

## 2021-05-29 NOTE — TOC Progression Note (Signed)
Transition of Care (TOC) - Progression Note  ? ? ?Patient Details  ?Name: NERY KALISZ ?MRN: [id number] ?Date of Birth: 06/30/1960 ? ?Transition of Care (TOC) CM/SW Contact  ?Ross Ludwig, LCSW ?Phone Number: ?05/29/2021, 5:12 PM ? ?Clinical Narrative:    ? ?Auth still pending reference number [id number]. ? ?Expected Discharge Plan: [place] ?Barriers to Discharge: Ship broker, Continued Medical Work up ? ?Expected Discharge Plan and Services ?Expected Discharge Plan: [place] ?In-house Referral: Clinical Social Work ?  ?Post Acute Care Choice: [place] ?Living arrangements for the past 2 months: Apartment ?                ?  ?  ?  ?  ?  ?  ?  ?  ?  ?  ? ? ?Social Determinants of Health (SDOH) Interventions ?  ? ?Readmission Risk Interventions ? ?  05/24/2021  ?  6:19 PM 07/26/2020  ?  2:21 PM 02/23/2020  ?  1:16 PM  ?Readmission Risk Prevention Plan  ?Transportation Screening Complete Complete Complete  ?Medication Review (RN Care Manager) Referral to Pharmacy Complete Complete  ?PCP or Specialist appointment within 3-5 days of discharge Complete Complete Complete  ?[place] or Home Care Consult Complete Complete Complete  ?SW Recovery Care/Counseling Consult Complete Complete Complete  ?Palliative Care Screening  Not Applicable Complete  ?Skilled Nursing Facility Complete Complete Complete  ? ? ?

## 2021-05-29 NOTE — Progress Notes (Signed)
Patient does not want RN to log in to computer and scan medications before giving them to her. RN explained to patient about safe medication administration. Patient stated RN was late for her medication that is scheduled as 2200. RN came to patient's room ar 2135. Charge nurse was notified and came to have a conversation with patient.  ? ?

## 2021-05-29 NOTE — Plan of Care (Signed)

## 2021-05-30 DIAGNOSIS — R5381 Other malaise: Secondary | ICD-10-CM | POA: Diagnosis not present

## 2021-05-30 DIAGNOSIS — R197 Diarrhea, unspecified: Secondary | ICD-10-CM | POA: Diagnosis not present

## 2021-05-30 NOTE — Progress Notes (Signed)
Physical Therapy Treatment ?Patient Details ?Name: Tiffany [name] ?MRN: [id number] ?DOB: Nov 06, 1960 ?Today's Date: 05/30/2021 ? ? ?History of Present Illness Patient is a 61 year old female with recent D/C from [facility] with cauda equina syndrome. Plan was to D/C to rehab however patient left AMA and has been at home since April 20th. EMS called 5/4 and patient presenting with generalized weakness, generalized body aches, abdominal pain, vomiting and diarrhea x1 week. PMH: coronary artery disease, carotid artery disease, schizophrenia, PTSD, history of cardiac arrest and anoxic brain injury, gout, anemia of chronic disease, GERD, diverticulosis, OSA, chronic pain syndrome, COPD, chronic diastolic CHF, CKD 3B ? ?  ?PT Comments  ? ? Pt asleep upon entry, reporting "I'm resting up for your session." Pt required max assist +2 for bed mobility using bed pads, mod assist +1 to come to upright static sitting but is able to self-maintain with single UE support on bed. Pt required total assist +2 for squat pivot transfer bed to chair. Once in chair, pt became more alert and engaged and completed short BUE exercise program, encouraged repeated completion after session to maintain strength and range of motion. Discharge plan remains appropriate. We will continue to follow acutely.  ?   ?Recommendations for follow up therapy are one component of a multi-disciplinary discharge planning process, led by the attending physician.  Recommendations may be updated based on patient status, additional functional criteria and insurance authorization. ? ?Follow Up Recommendations ? Skilled nursing-short term rehab (<3 hours/day) ?  ?  ?Assistance Recommended at Discharge Frequent or constant Supervision/Assistance  ?Patient can return home with the following Two people to help with walking and/or transfers;Two people to help with bathing/dressing/bathroom;Help with stairs or ramp for entrance;Assistance with cooking/housework;Direct  supervision/assist for financial management;Assist for transportation;Direct supervision/assist for medications management;Assistance with feeding ?  ?Equipment Recommendations ? Other (comment) (TBD at SNF)  ?  ?Recommendations for Other Services   ? ? ?  ?Precautions / Restrictions Precautions ?Precautions: Fall ?Precaution Comments: back precautions due to cauda equina ?Required Braces or Orthoses:  (log roll) ?Restrictions ?Weight Bearing Restrictions: No  ?  ? ?Mobility ? Bed Mobility ?Overal bed mobility: Needs Assistance ?Bed Mobility: Supine to Sit ?Rolling: Max assist, +2 for physical assistance, +2 for safety/equipment ?  ?Supine to sit: Max assist, +2 for physical assistance, +2 for safety/equipment, HOB elevated ?  ?  ?General bed mobility comments: Pt required Max A +2 for logroll and movement of BLE and trunk. Pt required mod assist to come to upright sitting but was able to maintain support after a few moments of supported sitting. ?  ? ?Transfers ?Overall transfer level: Needs assistance ?Equipment used: 2 person hand held assist ?Transfers: Bed to chair/wheelchair/BSC ?  ?  ?  ?Squat pivot transfers: Total assist, +2 physical assistance, +2 safety/equipment ?  ?  ?General transfer comment: Pt was able to complete squat pivot transfer total assist +2 for physical assist and safety using gait belt and bed pads under hips, pt required scooting of BLEs as pt was unable to assist with moving them during transfer. Lift pad under pt for hoyer lift back to bed, RN aware. After sitting upright in recliner pt more alert and able to complete BUE exercises. ?  ? ?Ambulation/Gait ?  ?  ?  ?  ?  ?  ?  ?General Gait Details:  (non-ambulatory) ? ? ?Stairs ?  ?  ?  ?  ?  ? ? ?Wheelchair Mobility ?  ? ?Modified  Rankin (Stroke Patients Only) ?  ? ? ?  ?Balance Overall balance assessment: Needs assistance ?Sitting-balance support: Feet supported, Single extremity supported ?Sitting balance-Leahy Scale: Fair ?Sitting  balance - Comments: Pt able to sit with BLE on floor and single UE holding bed rail without PT assistance. No leaning or overt LOB noted once stabilized in static sitting position ?Postural control: Right lateral lean ?  ?Standing balance-Leahy Scale: Zero ?Standing balance comment: unable ?  ?  ?  ?  ?  ?  ?  ?  ?  ?  ?  ?  ? ?  ?Cognition Arousal/Alertness: Awake/alert ?Behavior During Therapy: Anxious ?Overall Cognitive Status: Impaired/Different from baseline ?Area of Impairment: Following commands, Safety/judgement, Memory ?  ?  ?  ?  ?  ?  ?  ?  ?  ?Current Attention Level: Sustained ?Memory: Decreased short-term memory, Decreased recall of precautions ?Following Commands: Follows one step commands with increased time ?Safety/Judgement: Decreased awareness of safety, Decreased awareness of deficits ?  ?Problem Solving: Slow processing, Decreased initiation, Requires verbal cues, Requires tactile cues, Difficulty sequencing ?  ?  ?  ? ?  ?Exercises General Exercises - Upper Extremity ?Shoulder Flexion: AROM, Both, 5 reps ?Shoulder ABduction: AROM, Both, 5 reps ?Elbow Flexion: AROM, Both, 5 reps ?Elbow Extension: AROM, Both, 5 reps ?Wrist Flexion: AROM, Both, 5 reps ?Wrist Extension: AAROM, Both, 5 reps ? ?  ?General Comments   ?  ?  ? ?Pertinent Vitals/Pain Pain Assessment ?Pain Assessment: 0-10 ?Pain Score: 10-Worst pain ever ?Pain Location: back ?Pain Descriptors / Indicators: Crying, Discomfort, Grimacing (while sitting EOB) ?Pain Intervention(s): Limited activity within patient's tolerance, Monitored during session, Repositioned, Patient requesting pain meds-RN notified  ? ? ?Home Living   ?  ?  ?  ?  ?  ?  ?  ?  ?  ?   ?  ?Prior Function    ?  ?  ?   ? ?PT Goals (current goals can now be found in the care plan section) Acute Rehab PT Goals ?Patient Stated Goal: "I want to move more" ?PT Goal Formulation: With patient ?Time For Goal Achievement: 06/05/21 ?Potential to Achieve Goals: Fair ?Progress towards  PT goals: Progressing toward goals ? ?  ?Frequency ? ? ? Min 2X/week ? ? ? ?  ?PT Plan Current plan remains appropriate  ? ? ?Co-evaluation   ?  ?  ?  ?  ? ?  ?AM-PAC PT "6 Clicks" Mobility   ?Outcome Measure ? Help needed turning from your back to your side while in a flat bed without using bedrails?: Total ?Help needed moving from lying on your back to sitting on the side of a flat bed without using bedrails?: Total ?Help needed moving to and from a bed to a chair (including a wheelchair)?: Total ?Help needed standing up from a chair using your arms (e.g., wheelchair or bedside chair)?: Total ?Help needed to walk in hospital room?: Total ?Help needed climbing 3-5 steps with a railing? : Total ?6 Click Score: 6 ? ?  ?End of Session Equipment Utilized During Treatment: Gait belt ?Activity Tolerance: Patient limited by pain ?Patient left: with call bell/phone within reach;in chair;with chair alarm set ?Nurse Communication: Mobility status ?PT Visit Diagnosis: Muscle weakness (generalized) (M62.81);History of falling (Z91.81);Difficulty in walking, not elsewhere classified (R26.2) ?Pain - part of body: Leg (back) ?  ? ? ?Time: 7782-4235 ?PT Time Calculation (min) (ACUTE ONLY): 20 min ? ?Charges:  $Therapeutic Activity: 8-22 mins          ?          ? ?  Coolidge Breeze, PT, DPT ?[facility] ?Office: [phone] ?Pager: [phone] ? ? ?Coolidge Breeze ?05/30/2021, 11:30 AM ? ?

## 2021-05-30 NOTE — TOC Progression Note (Addendum)
Transition of Care (TOC) - Progression Note  ? ? ?Patient Details  ?Name: Tiffany [name] ?MRN: [id number] ?Date of Birth: 06-Jul-1960 ? ?Transition of Care (TOC) CM/SW Contact  ?Aleana Fifita, Marjie Skiff, RN ?Phone Number: ?05/30/2021, 1:05 PM ? ?Clinical Narrative:    ?Message received from [facility] that they were offering a peer to peer to the attending physician for SNF auth. Information provided to MD. [phone]. ? ?1200 Addendum: Per [facility] peer to peer was done and was denied. Fast Track Appeal information provide to pt. [phone] Fax [phone]. Pt states she will call and do appeal today. In the meantime pt asked if she would go to SNF under her Medicaid for 30 days and give them her SSI check. She states that no she will go to her home. TOC will continue to follow. Called and left update voicemail with Camilla from [place]. ? ? ?Expected Discharge Plan: [place] ?Barriers to Discharge: Ship broker, Continued Medical Work up ? ?Expected Discharge Plan and Services ?Expected Discharge Plan: [place] ?In-house Referral: Clinical Social Work ?  ?Post Acute Care Choice: [place] ?Living arrangements for the past 2 months: Apartment ?                ?  ?  ?Readmission Risk Interventions ? ?  05/24/2021  ?  6:19 PM 07/26/2020  ?  2:21 PM 02/23/2020  ?  1:16 PM  ?Readmission Risk Prevention Plan  ?Transportation Screening Complete Complete Complete  ?Medication Review (RN Care Manager) Referral to Pharmacy Complete Complete  ?PCP or Specialist appointment within 3-5 days of discharge Complete Complete Complete  ?[place] or Home Care Consult Complete Complete Complete  ?SW Recovery Care/Counseling Consult Complete Complete Complete  ?Palliative Care Screening  Not Applicable Complete  ?Skilled Nursing Facility Complete Complete Complete  ? ? ?

## 2021-05-30 NOTE — Plan of Care (Signed)

## 2021-05-30 NOTE — Care Management Important Message (Signed)
Important Message ? ?Patient Details IM Letter placed in Patients room. ?Name: Tiffany [name] ?MRN: [id number] ?Date of Birth: Mar 19, 1960 ? ? ?Medicare Important Message Given:  Yes ? ? ? ? ?Kerin Salen ?05/30/2021, 11:40 AM ?

## 2021-05-30 NOTE — Progress Notes (Signed)
?Progress Note ? ? ? ?Tiffany [name]   ?ZOX:[id number]  ?DOB: Jun 24, 1960  ?DOA: 05/19/2021     10 ?PCP: Lin Landsman, MD ? ?Initial CC: Diarrhea ? ?Hospital Course: ?Ms. Budreau is a 61 y.o. female with PMH CAD, carotid artery disease, schizophrenia, PTSD, history of cardiac arrest and anoxic brain injury, gout, anemia of chronic disease, GERD, diverticulosis, OSA, chronic pain syndrome, COPD, chronic diastolic CHF, CKD 3B, who presented to [facility] ED from home due to generalized weakness and severe diarrhea of 1 week duration. Associated with generalized body aches.  Patient was recently admitted on 04/24/2021 and discharged on 04/30/2021 to SNF after being treated for low back pain and seen by neurosurgery.  EMS was activated from her home where she lives alone.  She was found incontinent of watery green-colored stools.  She was brought to the ED profile evaluation.   ?  ?In the ED, she was noted to have light brown watery stool with bright red blood in it. Hemoccult positive.  There were no further reports of this after admission and hemoglobin remained stable. ? ?Interval History:  ?No events overnight. Sitting up in recliner this morning and feeling okay. She does state that her goal is eventually home. I discussed long term care and she says she'd rather go home after a possible short term rehab stay, but if unable to go, then she'd prefer to go home.  ? ?Assessment and Plan: ?* Diarrhea-resolved as of 05/27/2021 ?Given gentle IV fluid hydration ?Cdiff neg, GI panel neg ?Diarrhea has since resolved ?Pt reports hx of diarrhea over past year, attributes to hx of IBS. Most recent episode lasting over 2 weeks ?Cont on anti-diarrheal as needed ? ?Physical deconditioning ?- Working with PT ?- SNF recommended ? ?Anxiety and depression ?- Continue Cymbalta and Valium ? ?Schizophrenia ([place]) ?- Continue Saphris, Valium ? ?Chronic kidney disease, stage 3b ([place]) ?- patient has history of CKD3b. Baseline creat ~ 1.4, eGFR  34 ? ?Essential hypertension ?- Continue amlodipine, Coreg, prazosin ? ? ? ?Old records reviewed in assessment of this patient ? ?Antimicrobials: ? ? ?DVT prophylaxis:  ?Place and maintain sequential compression device Start: 05/20/21 1449 ? ? ?Code Status:   Code Status: Full Code ? ?Disposition Plan: rehab vs home with [facility] ?Status is: Inpatient ? ?Objective: ?Blood pressure 113/70, pulse 76, temperature 98.2 ?F (36.8 ?C), temperature source Oral, resp. rate 14, height '5\' 2"'  (1.575 m), weight 78.6 kg, SpO2 98 %.  ?Examination:  ?Physical Exam ?Constitutional:   ?   General: [name] not in acute distress. ?HENT:  ?   Head: Normocephalic and atraumatic.  ?   Mouth/Throat:  ?   Mouth: Mucous membranes are moist.  ?Eyes:  ?   Extraocular Movements: Extraocular movements intact.  ?Cardiovascular:  ?   Rate and Rhythm: Normal rate and regular rhythm.  ?Pulmonary:  ?   Effort: Pulmonary effort is normal.  ?   Breath sounds: Normal breath sounds.  ?Abdominal:  ?   Palpations: Abdomen is soft.  ?Musculoskeletal:     ?   General: Normal range of motion.  ?   Cervical back: Normal range of motion and neck supple.  ?Skin: ?   General: Skin is warm.  ?Neurological:  ?   General: No focal deficit present.  ?   Mental Status: [name] alert.  ?Psychiatric:     ?   Mood and Affect: Mood normal.  ?  ? ?Consultants:  ? ? ?Procedures:  ? ? ?  Data Reviewed: ?No results found. However, due to the size of the patient record, not all encounters were searched. Please check Results Review for a complete set of results. ?  ?I have Reviewed nursing notes, Vitals, and Lab results since pt's last encounter. Pertinent lab results : see above ?I have reviewed the last note from staff over past 24 hours ?I have discussed pt's care plan and test results with nursing staff, case manager ? ? LOS: 10 days  ? ?Dwyane Dee, MD ?[facility] ?05/30/2021, 2:57 PM ?

## 2021-05-30 NOTE — Telephone Encounter (Signed)
Requested medication (s) are due for refill today: Yes ? ?Requested medication (s) are on the active medication list:Yes ? ?Last refill:  01/14/21 ? ?Future visit scheduled: No ? ?Notes to clinic:  Unable to refill per protocol due to failed labs, no updated results.  ? ? ? ? ?Requested Prescriptions  ?Pending Prescriptions Disp Refills  ? atorvastatin (LIPITOR) 20 MG tablet [Pharmacy Med Name: ATORVASTATIN '20MG'$  TABLETS] 90 tablet   ?  Sig: TAKE 1 TABLET(20 MG) BY MOUTH DAILY  ?  ? Cardiovascular:  Antilipid - Statins Failed - 05/28/2021  8:23 AM  ?  ?  Failed - Lipid Panel in normal range within the last 12 months  ?  Cholesterol, Total  ?Date Value Ref Range Status  ?08/05/2018 127 100 - 199 mg/dL Final  ? ?LDL Calculated  ?Date Value Ref Range Status  ?08/05/2018 54 0 - 99 mg/dL Final  ? ?Direct LDL  ?Date Value Ref Range Status  ?04/05/2012 52 mg/dL Final  ?  Comment:  ?  ATP III Classification (LDL): ?      < 100        mg/dL         Optimal ?     100 - 129     mg/dL         Near or Above Optimal ?     130 - 159     mg/dL         Borderline High ?     160 - 189     mg/dL         High ?      > 190        mg/dL         Very High ?   ? ?HDL  ?Date Value Ref Range Status  ?08/05/2018 62 >39 mg/dL Final  ? ?Triglycerides  ?Date Value Ref Range Status  ?08/05/2018 53 0 - 149 mg/dL Final  ? ?  ?  ?  Passed - Patient is not pregnant  ?  ?  Passed - Valid encounter within last 12 months  ?  Recent Outpatient Visits   ? ?      ? 1 month ago Lumbar radiculopathy  ? [place], Charlane Ferretti, MD  ? 2 months ago Weakness of both lower extremities  ? [place], Charlane Ferretti, MD  ? 3 months ago Edema of both legs  ? [place] Gildardo Pounds, NP  ? 4 months ago Hospital discharge follow-up  ? [place], [place] W, NP  ? 4 months ago Pain in both lower extremities  ? [place] [name], MD  ? ?  ?  ? ? ?  ?  ?  ? ? ? ? ?

## 2021-05-31 DIAGNOSIS — R197 Diarrhea, unspecified: Secondary | ICD-10-CM | POA: Diagnosis not present

## 2021-05-31 NOTE — Progress Notes (Signed)
?Progress Note ? ? ? ?Tiffany [name]   ?FKC:[id number]  ?DOB: 12-23-1960  ?DOA: 05/19/2021     11 ?PCP: Tiffany Landsman, MD ? ?Initial CC: Diarrhea ? ?Hospital Course: ?Tiffany [name] is a 61 y.o. female with PMH CAD, carotid artery disease, schizophrenia, PTSD, history of cardiac arrest and anoxic brain injury, gout, anemia of chronic disease, GERD, diverticulosis, OSA, chronic pain syndrome, COPD, chronic diastolic CHF, CKD 3B, who presented to [facility] ED from home due to generalized weakness and severe diarrhea of 1 week duration. Associated with generalized body aches.  Patient was recently admitted on 04/24/2021 and discharged on 04/30/2021 to SNF after being treated for low back pain and seen by neurosurgery.  EMS was activated from her home where she lives alone.  She was found incontinent of watery green-colored stools.  She was brought to the ED profile evaluation.   ?  ?In the ED, she was noted to have light brown watery stool with bright red blood in it. Hemoccult positive.  There were no further reports of this after admission and hemoglobin remained stable. ? ?Interval History:  ?No events overnight. Resting in bed comfortably. [name] still hoping for eventually going home. ? ?Assessment and Plan: ?* Diarrhea-resolved as of 05/27/2021 ?Given gentle IV fluid hydration ?Cdiff neg, GI panel neg ?Diarrhea has since resolved ?Pt reports hx of diarrhea over past year, attributes to hx of IBS. Most recent episode lasting over 2 weeks ?Cont on anti-diarrheal as needed ? ?Physical deconditioning ?- Working with PT ?- SNF recommended; declined initially by insurance; awaiting further dispo planning  ? ?Anxiety and depression ?- Continue Cymbalta and Valium ? ?Schizophrenia (Tiffany [name]) ?- Continue Saphris, Valium ? ?Chronic kidney disease, stage 3b ([place]) ?- patient has history of CKD3b. Baseline creat ~ 1.4, eGFR 34 ? ?Essential hypertension ?- Continue amlodipine, Coreg, prazosin ? ? ? ?Old records reviewed in assessment of this  patient ? ?Antimicrobials: ? ? ?DVT prophylaxis:  ?Place and maintain sequential compression device Start: 05/20/21 1449 ? ? ?Code Status:   Code Status: Full Code ? ?Disposition Plan: rehab vs home with [facility] ?Status is: Inpatient ? ?Objective: ?Blood pressure 106/61, pulse 78, temperature 98.9 ?F (37.2 ?C), temperature source Oral, resp. rate 18, height '5\' 2"'  (1.575 m), weight 79 kg, SpO2 95 %.  ?Examination:  ?Physical Exam ?Constitutional:   ?   General: [name] not in acute distress. ?HENT:  ?   Head: Normocephalic and atraumatic.  ?   Mouth/Throat:  ?   Mouth: Mucous membranes are moist.  ?Eyes:  ?   Extraocular Movements: Extraocular movements intact.  ?Cardiovascular:  ?   Rate and Rhythm: Normal rate and regular rhythm.  ?Pulmonary:  ?   Effort: Pulmonary effort is normal.  ?   Breath sounds: Normal breath sounds.  ?Abdominal:  ?   Palpations: Abdomen is soft.  ?Musculoskeletal:     ?   General: Normal range of motion.  ?   Cervical back: Normal range of motion and neck supple.  ?Skin: ?   General: Skin is warm.  ?Neurological:  ?   General: No focal deficit present.  ?   Mental Status: [name] alert.  ?Psychiatric:     ?   Mood and Affect: Mood normal.  ?  ? ?Consultants:  ? ? ?Procedures:  ? ? ?Data Reviewed: ?No results found. However, due to the size of the patient record, not all encounters were searched. Please check Results Review for a complete set of  results. ?  ?I have Reviewed nursing notes, Vitals, and Lab results since pt's last encounter. Pertinent lab results : see above ?I have reviewed the last note from staff over past 24 hours ?I have discussed pt's care plan and test results with nursing staff, case manager ? ? LOS: 11 days  ? ?Dwyane Dee, MD ?[facility] ?05/31/2021, 3:52 PM ?

## 2021-05-31 NOTE — Plan of Care (Signed)

## 2021-05-31 NOTE — Progress Notes (Signed)
Occupational Therapy Treatment ?Patient Details ?Name: Tiffany [name] ?MRN: [id number] ?DOB: 04-24-1960 ?Today's Date: 05/31/2021 ? ? ?History of present illness Patient is a 61 year old female with recent D/C from [facility] with cauda equina syndrome. Plan was to D/C to rehab however patient left AMA and has been at home since April 20th. EMS called 5/4 and patient presenting with generalized weakness, generalized body aches, abdominal pain, vomiting and diarrhea x1 week. PMH: coronary artery disease, carotid artery disease, schizophrenia, PTSD, history of cardiac arrest and anoxic brain injury, gout, anemia of chronic disease, GERD, diverticulosis, OSA, chronic pain syndrome, COPD, chronic diastolic CHF, CKD 3B ?  ?OT comments ? Patient was noted to demonstrate some self limiting behaviors during session on this date. Patient was max A x2 for supine to sit on edge of bed with patient reporting she was able to complete task herself. Patient is noncompliant with back precautions. Patient would continue to benefit from skilled OT services at this time while admitted and after d/c to address noted deficits in order to improve overall safety and independence in ADLs.  ?  ? ?Recommendations for follow up therapy are one component of a multi-disciplinary discharge planning process, led by the attending physician.  Recommendations may be updated based on patient status, additional functional criteria and insurance authorization. ?   ?Follow Up Recommendations ? Skilled nursing-short term rehab (<3 hours/day)  ?  ?Assistance Recommended at Discharge Frequent or constant Supervision/Assistance  ?Patient can return home with the following ? Two people to help with walking and/or transfers;Two people to help with bathing/dressing/bathroom;Direct supervision/assist for medications management;Direct supervision/assist for financial management;Assist for transportation;Help with stairs or ramp for entrance;Assistance with  cooking/housework ?  ?Equipment Recommendations ? None recommended by OT  ?  ?Recommendations for Other Services   ? ?  ?Precautions / Restrictions Precautions ?Precautions: Fall ?Precaution Comments: back precautions due to cauda equina ?Restrictions ?Weight Bearing Restrictions: No  ? ? ?  ? ?Mobility Bed Mobility ?Overal bed mobility: Needs Assistance ?  ?Rolling: Max assist, +2 for physical assistance, +2 for safety/equipment ?  ?Supine to sit: Max assist, +2 for physical assistance, +2 for safety/equipment, HOB elevated ?Sit to supine: Max assist, +2 for physical assistance, +2 for safety/equipment ?  ?General bed mobility comments: Pt required Max A +2 for logroll and movement of BLE and trunk. Pt required mod assist to come to upright sitting but was able to maintain support after a few moments of supported sitting. ?  ? ?Transfers ?  ?  ?  ?  ?  ?  ?  ?  ?  ?  ?  ?  ?Balance Overall balance assessment: Needs assistance ?Sitting-balance support: Feet supported, Single extremity supported ?Sitting balance-Leahy Scale: Poor ?  ?Postural control: Right lateral lean ?  ?  ?  ?  ?  ?  ?  ?  ?  ?  ?  ?  ?  ?  ?   ? ?ADL either performed or assessed with clinical judgement  ? ?ADL Overall ADL's : Needs assistance/impaired ?  ?  ?  ?  ?  ?  ?  ?  ?  ?  ?  ?  ?  ?  ?  ?  ?  ?  ?  ?General ADL Comments: patient was agreeable to participation in session. patient reported that she was able to sit up on the edge of bed herself yesterday. patient was adimate about attempting to sit up on edge  of bed herself with disreguard for any back precaution education with patient reporting " i know how to move to not hurt myself". patient was TD +2 for supine to sit on edge of bed.patient upon sitting on edge of bed with +2 assistance indicated that she was able to complete task herself. patient was noted to have saturated pad under her with patient reporting that she felt the pad was wet. when educated on standign to change linens/  or pushing up to clear bottom from bed, patient reported that she did not trust having two females to help her do this. patient was educated that OT and rehab tech were trained in how to assist and that we did nto need to completely stand if she was choosing not to. patient declined to attemtp to stand. patient was able to sit on edge of bed for about 5 mins upright then patient was noted to lean to the R side. patient was educated on how the current position with elbow on bed was twisting shoulders with verbal and visual education provided. patient reported " i know what position my back is in and i know how to not make it hurt". patient had to be transitioned back to supine to clean bottom and change linens. patient was max A for rolling to each side in bed with education on keeping shoulders even with hips when turning to prevent twisting.Patient was able to reach perienal area in sidelying and while in supine with increased time when patient reported that therapist was not getting to the spot she needed to be cleaned. patient was transitioned in bed with all needs in place when  patient was noted to continue to lean to the R side as well. patient declined all repositioning at this time to aid in upright posture with no R lateral lean. nurse and NT made aware at this time. ?  ? ?Extremity/Trunk Assessment   ?  ?  ?  ?  ?  ? ?Vision   ?  ?  ?Perception   ?  ?Praxis   ?  ? ?Cognition Arousal/Alertness: Awake/alert ?Behavior During Therapy: Flat affect ?Overall Cognitive Status: Impaired/Different from baseline ?  ?  ?  ?  ?  ?  ?  ?  ?  ?  ?  ?  ?  ?  ?  ?  ?General Comments: Patient was noted to be anxious about attempting standing. ?  ?  ?   ?Exercises   ? ?  ?Shoulder Instructions   ? ? ?  ?General Comments    ? ? ?Pertinent Vitals/ Pain       Pain Assessment ?Pain Assessment: Faces ?Faces Pain Scale: Hurts little more ?Pain Location: back ?Pain Descriptors / Indicators: Discomfort, Grimacing, Guarding ?Pain  Intervention(s): Limited activity within patient's tolerance, Monitored during session, Repositioned ? ?Home Living   ?  ?  ?  ?  ?  ?  ?  ?  ?  ?  ?  ?  ?  ?  ?  ?  ?  ?  ? ?  ?Prior Functioning/Environment    ?  ?  ?  ?   ? ?Frequency ? Min 2X/week  ? ? ? ? ?  ?Progress Toward Goals ? ?OT Goals(current goals can now be found in the care plan section) ? Progress towards OT goals: Not progressing toward goals - comment (patient declined to stand today stating she only stands with men) ? ?   ?Plan Discharge plan  remains appropriate   ? ?Co-evaluation ? ? ?   ?  ?  ?  ?  ? ?  ?AM-PAC OT "6 Clicks" Daily Activity     ?Outcome Measure ? ? Help from another person eating meals?: A Little ?Help from another person taking care of personal grooming?: A Little ?Help from another person toileting, which includes using toliet, bedpan, or urinal?: Total ?Help from another person bathing (including washing, rinsing, drying)?: A Lot ?Help from another person to put on and taking off regular upper body clothing?: A Lot ?Help from another person to put on and taking off regular lower body clothing?: Total ?6 Click Score: 12 ? ?  ?End of Session   ? ?OT Visit Diagnosis: Other abnormalities of gait and mobility (R26.89);Muscle weakness (generalized) (M62.81) ?  ?Activity Tolerance Patient limited by pain ?  ?Patient Left in bed;with call bell/phone within reach;with bed alarm set ?  ?Nurse Communication Mobility status ?  ? ?   ? ?Time: 3212-2482 ?OT Time Calculation (min): 29 min ? ?Charges: OT General Charges ?$OT Visit: 1 Visit ?OT Treatments ?$Self Care/Home Management : 8-22 mins ?$Therapeutic Activity: 8-22 mins ? ?Quency Tober OTR/L, MS ?Acute Rehabilitation Department ?Office# [phone] ?Pager# [phone] ? ? ?[place] ?05/31/2021, 2:16 PM ?

## 2021-05-31 NOTE — TOC Progression Note (Addendum)
Transition of Care (TOC) - Progression Note  ? ? ?Patient Details  ?Name: Tiffany [name] ?MRN: [id number] ?Date of Birth: 1960-05-21 ? ?Transition of Care (TOC) CM/SW Contact  ?Ross Ludwig, LCSW ?Phone Number: ?05/31/2021, 2:28 PM ? ?Clinical Narrative:    ? ?Patient was denied by [employer].  Patient was given phone number to complete a fast appeal yesterday. Per bedside nurse, patient was going to complete appeal today.  [name] also received a phone call from [place] [phone] who is asked if MD can order a psych consult for capacity to make her own decisions.  [name] asked physician, he consulted psych to see patient, per physician if psych not able to see her, he will capacity assessment.   ? ?[name] continuing to follow patient's progress.  Patient is currently not agreeable to going to SNF unless her insurance approves.  Patient was asked by [facility] yesterday if she would be willing to turn over her SSI check in order to stay at a SNF for 30 days and patient said no.  TOC to continue to follow patient's progress throughout discharge planning. ? ?4:30pm  [name] was informed by bedside nurse that patient was trying to complete appeal however she was not able to get through.  [name] called Bernadene Bell to confirm phone number, and it the number is [phone].  [name] provided phone number to bedside nurse to give patient.  Patient called and decided she wanted to go home with home health instead.    ? ? ?Patient is alert and oriented x4 at this time, and has the right to make the decision to go home with home health instead of SNF placement.  Patient would benefit from [facility] PT, OT, [place], [place], and Education officer, museum.   ? ?[name] contacted [facility] who is able to accept the referral.  [name] spoke to [place] and they can provide [facility] for patient.  TOC to continue to follow patient's progress throughout discharge planning.  [name] provided APS worker name and contact number for patient. ? ?APS requested a psych eval on  patient in regards to capacity, per physician they have been consulted.   ? ?Expected Discharge Plan: [place] ?Barriers to Discharge: Ship broker, Continued Medical Work up ? ?Expected Discharge Plan and Services ?Expected Discharge Plan: [place] ?In-house Referral: Clinical Social Work ?  ?Post Acute Care Choice: [place] ?Living arrangements for the past 2 months: Apartment ?                ?  ?  ?  ?  ?  ?  ?  ?  ?  ?  ? ? ?Social Determinants of Health (SDOH) Interventions ?  ? ?Readmission Risk Interventions ? ?  05/24/2021  ?  6:19 PM 07/26/2020  ?  2:21 PM 02/23/2020  ?  1:16 PM  ?Readmission Risk Prevention Plan  ?Transportation Screening Complete Complete Complete  ?Medication Review (RN Care Manager) Referral to Pharmacy Complete Complete  ?PCP or Specialist appointment within 3-5 days of discharge Complete Complete Complete  ?[place] or Home Care Consult Complete Complete Complete  ?SW Recovery Care/Counseling Consult Complete Complete Complete  ?Palliative Care Screening  Not Applicable Complete  ?Skilled Nursing Facility Complete Complete Complete  ? ? ?

## 2021-06-01 DIAGNOSIS — A09 Infectious gastroenteritis and colitis, unspecified: Secondary | ICD-10-CM

## 2021-06-01 DIAGNOSIS — R197 Diarrhea, unspecified: Secondary | ICD-10-CM | POA: Diagnosis not present

## 2021-06-01 MED ORDER — FUROSEMIDE 40 MG PO TABS
40.0000 mg | ORAL_TABLET | Freq: Every day | ORAL | Status: DC
  Administered 2021-06-01 – 2021-06-02 (×2): 40 mg via ORAL
  Filled 2021-06-01 (×2): qty 1

## 2021-06-01 MED ORDER — POTASSIUM CHLORIDE CRYS ER 20 MEQ PO TBCR
20.0000 meq | EXTENDED_RELEASE_TABLET | Freq: Every day | ORAL | Status: DC
  Administered 2021-06-01 – 2021-06-02 (×2): 20 meq via ORAL
  Filled 2021-06-01 (×2): qty 1

## 2021-06-01 NOTE — Plan of Care (Signed)

## 2021-06-01 NOTE — Progress Notes (Signed)
Pt. Very concerned about her BLE swelling, and why she has not yet been placed back on her torsemide. This RN explained that Pts. Rounding doctor was gone for the day, and that the medication would not likely be something that the on call provider would order. Pt. Still requesting for this RN to try and ask on call provider to get it reordered tonight. Gershon Cull, NP paged.  ?

## 2021-06-01 NOTE — Progress Notes (Signed)
?PROGRESS NOTE ? ? ? ?Tiffany [name]  HQP:[id number] DOB: [birth year] DOA: 05/19/2021 ?PCP: Lin Landsman, MD  ? ? ?Brief Narrative:  ?[name] is a 61 y.o. female with PMH CAD, carotid artery disease, schizophrenia, PTSD, history of cardiac arrest and anoxic brain injury, gout, anemia of chronic disease, GERD, diverticulosis, OSA, chronic pain syndrome, COPD, chronic diastolic CHF, CKD 3b, who presented to [facility] ED from home due to generalized weakness and severe diarrhea of 1 week duration. Associated with generalized body aches.  Patient was recently admitted on 04/24/2021 and discharged on 04/30/2021 to SNF after being treated for low back pain and seen by neurosurgery.  EMS was activated from her home where she lives alone.  She was found incontinent of watery green-colored stools.  She was brought to the ED for evaluation.   ?  ?In the ED, she was noted to have light brown watery stool with bright red blood in it. Hemoccult positive.  There were no further reports of this after admission and hemoglobin remained stable. ?Remains in the hospital because of unsafe discharge disposition plan. ? ? ?Assessment & Plan: ?  ?Diarrhea: Resolved as of 5/12.  Treated with IV fluids.  C. difficile and GI panel negative.  Can use Imodium as needed. ? ?Physical deconditioning: Continues to have poor mobility.  PT OT recommended SNF, declined by insurance, awaiting further disposition plan. ? ?Anxiety and depression with history of schizophrenia: ?Stable.  Patient on Cymbalta and Valium as well as Saphris. ?Seen by psychiatry 5/17, does have capacity to make decisions. ? ?CKD stage IIIb: At about baseline.  She will resume Lasix doses today. ? ?Essential hypertension: Blood pressure stable on amlodipine, Coreg and prazosin. ? ?Patient is medically stable.  Patient needed maximum support to mobilize and get out of the bed.  [name] not able to go home alone. ?Social worker, APS trying to find out about family support system and home  support system.  She will need 24/7 supervision. ?Possible home once safe discharge disposition in place with home health PT OT. ? ? ?DVT prophylaxis: Place and maintain sequential compression device Start: 05/20/21 1449 ? ? ?Code Status: Full code ?Family Communication: Son on the phone. ?Disposition Plan: Status is: Inpatient ?Remains inpatient appropriate because: Safe discharge disposition plan pending. ?  ? ? ?Consultants:  ?Psychiatry ? ?Procedures:  ?None ? ?Antimicrobials:  ?None ? ? ?Subjective: ?Patient seen and examined.  Denies any complaints.  She was wondering why she is not back on her diuretics as her legs are swelling now. ?Patient tells me that she can go home with home health PT OT.  Patient tells me that she will need a board to slide around. ? ?Objective: ?Vitals:  ? 05/31/21 2025 06/01/21 0500 06/01/21 0630 06/01/21 0857  ?BP: 110/72 (!) 124/99  128/72  ?Pulse: 75 69  72  ?Resp: '15 16 18   '$ ?Temp: 98.5 ?F (36.9 ?C) 97.9 ?F (36.6 ?C)    ?TempSrc: Oral Oral    ?SpO2: 95% 97%  93%  ?Weight:      ?Height:      ? ? ?Intake/Output Summary (Last 24 hours) at 06/01/2021 1332 ?Last data filed at 06/01/2021 0500 ?Gross per 24 hour  ?Intake --  ?Output 450 ml  ?Net -450 ml  ? ?Filed Weights  ? 05/28/21 0810 05/30/21 0458 05/31/21 0424  ?Weight: 76.6 kg 78.6 kg 79 kg  ? ? ?Examination: ? ?General exam: Appears calm and comfortable  ?Respiratory system: Clear to auscultation.  Respiratory effort normal. ?Cardiovascular system: S1 & S2 heard, RRR.  Nonpitting edema of the legs. ?Gastrointestinal system: Abdomen is nondistended, soft and nontender. No organomegaly or masses felt. Normal bowel sounds heard. ?Central nervous system: Alert and oriented. No focal neurological deficits. ?Extremities: Symmetric 5 x 5 power. ?Skin: No rashes, lesions or ulcers ?Psychiatry: Judgement and insight appear normal. Mood & affect appropriate.  ? ? ? ?Data Reviewed: I have personally reviewed following labs and imaging  studies ? ?CBC: ?No results for input(s): WBC, NEUTROABS, HGB, HCT, MCV, PLT in the last 168 hours. ?Basic Metabolic Panel: ?Recent Labs  ?Lab 05/26/21 ?3009  ?NA 138  ?K 3.6  ?CL 106  ?CO2 28  ?GLUCOSE 99  ?BUN 33*  ?CREATININE 1.41*  ?CALCIUM 8.9  ?MG 1.7  ? ?GFR: ?Estimated Creatinine Clearance: 40.3 mL/min (A) (by C-G formula based on SCr of 1.41 mg/dL (H)). ?Liver Function Tests: ?No results for input(s): AST, ALT, ALKPHOS, BILITOT, PROT, ALBUMIN in the last 168 hours. ?No results for input(s): LIPASE, AMYLASE in the last 168 hours. ?No results for input(s): AMMONIA in the last 168 hours. ?Coagulation Profile: ?No results for input(s): INR, PROTIME in the last 168 hours. ?Cardiac Enzymes: ?No results for input(s): CKTOTAL, CKMB, CKMBINDEX, TROPONINI in the last 168 hours. ?BNP (last 3 results) ?Recent Labs  ?  06/04/20 ?1140 06/15/20 ?1636 07/13/20 ?1310  ?PROBNP 23,300* 2,313* 201  ? ?HbA1C: ?No results for input(s): HGBA1C in the last 72 hours. ?CBG: ?No results for input(s): GLUCAP in the last 168 hours. ?Lipid Profile: ?No results for input(s): CHOL, HDL, LDLCALC, TRIG, CHOLHDL, LDLDIRECT in the last 72 hours. ?Thyroid Function Tests: ?No results for input(s): TSH, T4TOTAL, FREET4, T3FREE, THYROIDAB in the last 72 hours. ?Anemia Panel: ?No results for input(s): VITAMINB12, FOLATE, FERRITIN, TIBC, IRON, RETICCTPCT in the last 72 hours. ?Sepsis Labs: ?No results for input(s): PROCALCITON, LATICACIDVEN in the last 168 hours. ? ?No results found for this or any previous visit (from the past 240 hour(s)).  ? ? ? ? ? ?Radiology Studies: ?No results found. ? ? ? ? ? ?Scheduled Meds: ? amLODipine  10 mg Oral Daily  ? asenapine  10 mg Sublingual BID  ? atorvastatin  20 mg Oral Daily  ? carvedilol  6.25 mg Oral BID WC  ? diazepam  5 mg Oral QHS  ? dicyclomine  10 mg Oral TID AC  ? DULoxetine  30 mg Oral Daily  ? furosemide  40 mg Oral Daily  ? morphine  15 mg Oral Q12H  ? potassium chloride  20 mEq Oral Daily  ?  prazosin  2 mg Oral QHS  ? pregabalin  75 mg Oral BID  ? terbinafine  1 application. Topical TID  ? thiamine  100 mg Oral Daily  ? ?Continuous Infusions: ? ? LOS: 12 days  ? ? ?Time spent: 35 minutes ? ? ? ?Barb Merino, MD ?[facility] ?Pager [phone] ? ?

## 2021-06-01 NOTE — TOC Progression Note (Addendum)
Transition of Care (TOC) - Progression Note  ? ? ?Patient Details  ?Name: Tiffany [name] ?MRN: [id number] ?Date of Birth: [birth year] ? ?Transition of Care (TOC) CM/SW Contact  ?[place], LCSW ?Phone Number: ?06/01/2021, 2:38 PM ? ?Clinical Narrative:    ? ?[name] met with pt to discuss DC plan. Plan is aware that insurance denied SNF for short term rehab though she does desire to return home with [facility]. Pt does have medicaid but it is managed medicaid and would need to be switched to state medicaid to have LTC SNF coverage. Pt states she lives alone in an apartment but that she has friends who will be helping her. She also states she has aides that visit her 2-3 hours/day and help with cooking/cleaning. She does not know name of company for aide services and refuses consent for [name] to speak with the friends she spoke of. Pt states she has a walker, 3-in-1, and electric wheelchair. She states she would be able to get around her house in the wheelchair. When [name] asked about transfers, pt states she has used a sliding board in the past and that if she can get another one of those she would be able to transfer on her own. Pt has 1 daughter who lives in [place] but does not consent to [place] talking to daughter. Pt consents to [name] contacting daughter Tiffany [name] who lives out of state. She states her mother also lives in [place] but that her mother is too old to assist. She state's "I'm never alone" and when [name] expresses concerns about safety, pt responds "well my home is safe." [name] explained he would discuss plans further with daughter, MD, and APS worker.  ? ?[name] called pt daughter; no answer, left voicemail requesting return call.  ? ?[name] called APS social worker Lizabeth Leyden [phone] who states that pt told her all of the same information. Tiffany [name] has been to pt's home and there are 3 steps into the home. She does not have any indication that pt's statements that friends will help at home is true. She has been unable  to verify aide services. She states she did visit pt at home in the past and that pt's mother was unable to assist pt when [place] visited. [name] explains that psych consult determined that pt has full capacity. Tiffany [name] explains that since pt has capacity "we cant keep her from going home." [name] explains he will provide update regarding DC plans.  ? ?1505: Received voicemail from pt daughter Tiffany [name] stating that she is busy in a classroom but requested [name] leave updates on voicemail. [name] left voicemail providing updates regarding situation.  ? ?1515: [name] called back. [name] provided update on situation. Pt could possibly go to snf with her medicaid but would have to turn over SSI check to facility to cover cost. Pt apparently refused this once. [name] would like [name] to discuss with pt one more time. [name] is aware that pt may be Dc'd home if refuses SNF under medicaid. Tiffany [name] explains that she lives in [place]. She states that pt has also told her that she has aide services and friends helping at home but only verification of this info is from pt telling her. She states that pt does not have a daughter in [place]. She reports pt has a son in [place] and a Son in [place] but they are not involved with pt. TOC will update daughter on dc.  ? ?Expected Discharge Plan: [place] ?  Barriers to Discharge: Unsafe home situation ? ?Expected Discharge Plan and Services ?Expected Discharge Plan: [place] ?In-house Referral: Clinical Social Work ?  ?Post Acute Care Choice: [place] ?Living arrangements for the past 2 months: Apartment ?                ?  ?  ?  ?  ?  ?  ?  ?  ?  ?  ? ? ?Social Determinants of Health (SDOH) Interventions ?  ? ?Readmission Risk Interventions ? ?  05/24/2021  ?  6:19 PM 07/26/2020  ?  2:21 PM 02/23/2020  ?  1:16 PM  ?Readmission Risk Prevention Plan  ?Transportation Screening Complete Complete Complete  ?Medication Review (RN Care Manager) Referral to  Pharmacy Complete Complete  ?PCP or Specialist appointment within 3-5 days of discharge Complete Complete Complete  ?Blue or Home Care Consult Complete Complete Complete  ?SW Recovery Care/Counseling Consult Complete Complete Complete  ?Palliative Care Screening  Not Applicable Complete  ?Skilled Nursing Facility Complete Complete Complete  ? ? ?

## 2021-06-01 NOTE — Consult Note (Signed)
[facility] Face-to-Face Psychiatry Consult  ? ?Reason for Consult:  capacity ?Referring Physician:  Dr.Ghimire ?Patient Identification: Tiffany [name] ?MRN:  [id number] ?Principal Diagnosis: Diarrhea ?Diagnosis:  Active Problems: ?  Essential hypertension ?  Chronic kidney disease, stage 3b ([place]) ?  Schizophrenia ([place]) ?  Anxiety and depression ?  Physical deconditioning ? ? ?Total Time spent with patient: 30 minutes ? ?Subjective:   ?Tiffany [name] is a 61 y.o. female patient admitted with diarrhea consult for medical capacity decision making skills. ? ?HPI:  61 yo female with abdominal and leg pains, history of schizoaffective disorder.  Capacity consult request.  [name] alert and oriented times four.  When asked about the refusal to go to a SNF. Patient states"I didn't refuse my insurance declined the claim. I left the last place because it was not up to my standards, and didn't agree with the things they were doing. " [name] agreeable to get help for her medical issues and this is the reason she came to the ED, she also mentions she is open to home health at this time and seeks all services. " I can go home and get the same care if not better care than I can in a facility. "   The patient lives alone however has children that are her support system and plan to be home in order to aid in her care. She was talking to her son upon entering the room, and he did wish to be present during the psychiatric capacity evaluation. [name] cooperative and has capacity to make her own medical decisions.  History of schizoaffective disorder and medications reviewed.  [name] currently seeing Dr. Arlyn Leak for outpatient psychiatry and stable on saphris, lexapro, vyvanse, and cymbalta.  ? ?Past Psychiatric History: schizoaffective disorder ? ?Risk to Self:  none ?Risk to Others:  none ?Prior Inpatient Therapy:  not recently for mental health ?Prior Outpatient Therapy:  yes ? ?Past Medical History:  ?Past Medical History:  ?Diagnosis Date  ?  Agitation 11/22/2017  ? Anoxic brain injury ([place]) 09/08/2016  ? C. Arrest due to respiratory failure and COPD exacerbation  ? Anxiety   ? Arthritis   ? "all over" (04/10/2016)  ? Asthma 10/18/2010  ? Binge eating disorder   ? Cardiac arrest ([place]) 09/08/2016  ? PEA  ? Carotid artery stenosis   ? 1-39% bilateral by dopplers 11/2016  ? Chronic diastolic (congestive) heart failure (HCC)   ? Chronic kidney disease, stage 3 (HCC)   ? Chronic pain syndrome 06/18/2012  ? Chronic post-traumatic stress disorder (PTSD) 05/27/2018  ? Chronic respiratory failure with hypoxia and hypercapnia ([place]) 06/22/2015  ? TRILOGY Vent >AVAPA-ES., Vt target 200-400, Max P 30 , PS max 20 , PS min 6-10 , E Max 6, E Min 4, Rate Auto AVAPS Rate 2 (titrate for pt comfort) , bleed O2 at 5l/m continuous flow .   ? Closed displaced fracture of fifth metacarpal bone 03/21/2018  ? Cocaine use disorder, severe, in sustained remission ([place]) 12/17/2015  ? Complication of anesthesia   ? decreased bp, decreased heart rate  ? COPD (chronic obstructive pulmonary disease) ([place]) 07/08/2014  ? Depression   ? Diabetic neuropathy ([place]) 04/24/2011  ? Difficulty with speech 01/24/2018  ? Disorder of nervous system   ? Drug abuse ([place]) 11/21/2017  ? Dyslipidemia 04/24/2011  ? Elevated troponin 04/28/2012  ? Emphysema   ? Encephalopathy 11/21/2017  ? Essential hypertension 03/22/2016  ? Fibula fracture 07/10/2016  ? Frequent  falls 10/11/2017  ? GERD (gastroesophageal reflux disease)   ? Gout 04/11/2017  ? Heart attack ([place]) 1980s  ? History of blood transfusion 1994  ? "couldn't stop bleeding from my period"  ? History of drug abuse in remission ([place]) 11/28/2015  ? Quit in 2017  ? Hyperlipidemia LDL goal <70   ? Incontinence   ? Manic depression ([place])   ? Morbid obesity ([place]) 10/18/2010  ? Obstructive sleep apnea 10/18/2010  ? On home oxygen therapy   ? "6L; 24/7" (04/10/2016)  ? OSA on CPAP   ? "wear mask sometimes" (04/10/2016)  ? Paranoid ([place])   ? "sometimes; I'm on  RX for it" (04/10/2016)  ? Prolonged Q-T interval on ECG   ? Rectal bleeding 12/31/2015  ? Schizoaffective disorder, bipolar type ([place]) 04/05/2018  ? Seasonal allergies   ? Seborrheic keratoses 12/31/2013  ? Seizures ([place])   ? "don't know what kind; last one was ~ 1 yr ago" (04/10/2016)  ? Sinus bradycardia   ? Stroke [facility]) 1980s  ? denies residual on 04/10/2016  ? Thrush 09/19/2013  ? Type 2 diabetes mellitus ([place]) 10/18/2010  ?  ?Past Surgical History:  ?Procedure Laterality Date  ? [place]  ? COLONOSCOPY WITH PROPOFOL N/A 04/01/2021  ? Procedure: COLONOSCOPY WITH PROPOFOL;  Surgeon: Carol Ada, MD;  Location: [facility];  Service: Gastroenterology;  Laterality: N/A;  Rectal bleeding with drop in hemoglobin to 7.2 g/dL  ? HERNIA REPAIR    ? IR CHOLANGIOGRAM EXISTING TUBE  07/20/2016  ? IR PERC CHOLECYSTOSTOMY  05/10/2016  ? IR RADIOLOGIST EVAL & MGMT  06/08/2016  ? IR RADIOLOGIST EVAL & MGMT  06/29/2016  ? IR SINUS/FIST TUBE CHK-NON GI  07/12/2016  ? RIGHT/LEFT HEART CATH AND CORONARY ANGIOGRAPHY N/A 06/19/2017  ? Procedure: RIGHT/LEFT HEART CATH AND CORONARY ANGIOGRAPHY;  Surgeon: Jolaine Artist, MD;  Location: [facility];  Service: Cardiovascular;  Laterality: N/A;  ? TIBIA IM NAIL INSERTION Right 07/12/2016  ? Procedure: INTRAMEDULLARY (IM) NAIL RIGHT TIBIA;  Surgeon: Leandrew Koyanagi, MD;  Location: [facility];  Service: Orthopedics;  Laterality: Right;  ? UMBILICAL HERNIA REPAIR  ~ 1963  ? "that's why I don't have a belly button"  ? VAGINAL HYSTERECTOMY    ? ?Family History:  ?Family History  ?Problem Relation Age of Onset  ? Cancer Mother   ?     lung  ? Depression Mother   ? Cancer Father   ?     prostate  ? Depression Sister   ? Anxiety disorder Sister   ? Schizophrenia Sister   ? Bipolar disorder Sister   ? Depression Sister   ? Depression Brother   ? Heart failure Other   ?     cousin  ? ?Family Psychiatric  History: see above ?Social History:  ?Social History  ? ?Substance and Sexual  Activity  ?Alcohol Use No  ? Alcohol/week: 0.0 standard drinks  ?   ?Social History  ? ?Substance and Sexual Activity  ?Drug Use Not Currently  ? Types: Cocaine  ? Comment: 04/10/2016 "last used cocaine back in November 2017"  ?  ?Social History  ? ?Socioeconomic History  ? Marital status: Widowed  ?  Spouse name: Not on file  ? Number of children: 3  ? Years of education: Not on file  ? Highest education level: Not on file  ?Occupational History  ? Occupation: disabled  ?  Comment: factory production  ?Tobacco Use  ?  Smoking status: Former  ?  Packs/day: 1.50  ?  Years: 38.00  ?  Pack years: 57.00  ?  Types: Cigarettes  ?  Start date: 03/13/1977  ?  Quit date: 04/10/2016  ?  Years since quitting: 5.1  ? Smokeless tobacco: Never  ?Vaping Use  ? Vaping Use: Never used  ?Substance and Sexual Activity  ? Alcohol use: No  ?  Alcohol/week: 0.0 standard drinks  ? Drug use: Not Currently  ?  Types: Cocaine  ?  Comment: 04/10/2016 "last used cocaine back in November 2017"  ? Sexual activity: Not Currently  ?  Birth control/protection: Surgical  ?Other Topics Concern  ? Not on file  ?Social History Narrative  ? Has 1 son, [name]  ? Lives with son and his boyfriend  ? Her house has ramps and handrails should she ever needs them.   ? Her mother lives down the street from her and is a good support person in addition to her son.  ? She drives herself, has private transportation.   ? Cocaine free since 02/24/16, smoke free since 04/10/16  ? ?Social Determinants of Health  ? ?Financial Resource Strain: Not on file  ?Food Insecurity: Not on file  ?Transportation Needs: Not on file  ?Physical Activity: Not on file  ?Stress: Not on file  ?Social Connections: Not on file  ? ?Additional Social History: ?  ? ?Allergies:   ?Allergies  ?Allergen Reactions  ? Hydrocodone Shortness Of Breath  ? Anette Guarneri [Lurasidone Hcl] Anaphylaxis  ? Magnesium-Containing Compounds Anaphylaxis  ?  Tolerated Ensure  ? Prednisone Anaphylaxis, Swelling and Other (See  Comments)  ?  Tongue swelling, lip swelling, throat swelling, per pt   ? Tramadol Anaphylaxis and Swelling  ? Codeine Nausea And Vomiting  ? Trazodone Other (See Comments)  ?  paranoia  ? Topamax [Topiramate

## 2021-06-02 DIAGNOSIS — F419 Anxiety disorder, unspecified: Secondary | ICD-10-CM

## 2021-06-02 DIAGNOSIS — N1832 Chronic kidney disease, stage 3b: Secondary | ICD-10-CM

## 2021-06-02 DIAGNOSIS — F32A Depression, unspecified: Secondary | ICD-10-CM

## 2021-06-02 MED ORDER — TORSEMIDE 20 MG PO TABS: 20.0000 mg | ORAL_TABLET | Freq: Every day | ORAL | 0 refills | Status: DC

## 2021-06-02 MED ORDER — OXYCODONE HCL 5 MG PO TABS
5.0000 mg | ORAL_TABLET | ORAL | 0 refills | Status: AC | PRN
Start: 2021-06-02 — End: 2021-06-07

## 2021-06-02 MED ORDER — MORPHINE SULFATE ER 15 MG PO TBCR: 15.0000 mg | EXTENDED_RELEASE_TABLET | Freq: Two times a day (BID) | ORAL | 0 refills | Status: AC

## 2021-06-02 MED ORDER — METHOCARBAMOL 500 MG PO TABS: 500.0000 mg | ORAL_TABLET | Freq: Three times a day (TID) | ORAL | 0 refills | Status: DC | PRN

## 2021-06-02 MED ORDER — SENNOSIDES-DOCUSATE SODIUM 8.6-50 MG PO TABS: 1.0000 | ORAL_TABLET | Freq: Once | ORAL | Status: DC

## 2021-06-02 NOTE — Discharge Summary (Signed)
Physician Discharge Summary  Tiffany [name] RCB:[id number] DOB: Jun 03, 1960 DOA: 05/19/2021  PCP: Lin Landsman, MD  Admit date: 05/19/2021 Discharge date: 06/02/2021  Admitted From: Home Disposition: Home with home health  Recommendations for Outpatient Follow-up:  Follow up with PCP in 1-2 weeks Please obtain BMP/CBC in one week   Home Health: PT/OT/RN/social worker/home health aide Equipment/Devices: Sliding board  Discharge Condition: Fair CODE STATUS: Full code Diet recommendation: Low-salt diet  Discharge summary: Tiffany [name] is a 61 y.o. female with PMH CAD, carotid artery disease, schizophrenia, PTSD, history of cardiac arrest and anoxic brain injury, gout, anemia of chronic disease, GERD, diverticulosis, OSA, chronic pain syndrome, COPD, chronic diastolic CHF, CKD 3b, who presented to [facility] ED from home due to generalized weakness and severe diarrhea of 1 week duration. Associated with generalized body aches.  Patient was recently admitted on 04/24/2021 and discharged on 04/30/2021 to SNF after being treated for low back pain and seen by neurosurgery.  EMS was activated from her home where she lives alone.  She was found incontinent of watery green-colored stools.  She was brought to the ED for evaluation.  In the ED, she was noted to have light brown watery stool with bright red blood in it. Hemoccult positive.  There were no further reports of this after admission and hemoglobin remained stable. Remained in the hospital because of unsafe discharge disposition plan, patient undecided and persistence ongoing home.  Gastroenteritis, diarrhea with moderate dehydration: Improved.  Normalized.  C. difficile and GI pathogen panel negative.  Anxiety, depression and history of schizophrenia: Stable.  On Cymbalta, Valium, Saphris. Patient was evaluated by psychiatry on 5/17, she does have capacity to make decisions.  Her mental illness are under control.  CKD stage IIIb: At about  baseline.  Essential hypertension blood pressure stable on amlodipine, Coreg and prazosin.  Chronic pain syndrome: Currently on Robaxin, MS Contin and oxycodone.  Could not verify her outpatient pain management/opiate prescriptions.  Not available in the records.  She will be prescribed Robaxin.  She will be prescribed a short course of oxycodone for pain relief and help with mobility.  Discharge disposition plan: Remain in the hospital with undecided discharge disposition.  Patient insist on going home.  A capacity evaluation was done by psychiatry and she was deemed capable of making her healthcare decisions. Refer to skilled nursing facility for inpatient therapies, patient declined as she did not want to give up her Social Security check. Patient is still needed significant support to go home, she has arranged for home health aide about 4 hours a day to help her.  Her mother will come and stay with her 24/7.  High risk of readmission.  Challenging discharge planning.  This was challenging situation.  Patient declined [name] to contact her family including her daughter, mother.  Patient insisted on going home.  Medically stable.  Since patient is able to make her healthcare decisions and medically stable, discharged home.  Will prescribe multiple home health services and social worker to evaluate at home.  Social worker to evaluate for safety living at home.      Discharge Diagnoses:  Active Problems:   Physical deconditioning   Essential hypertension   Chronic kidney disease, stage 3b (HCC)   Schizophrenia (HCC)   Anxiety and depression    Discharge Instructions  Discharge Instructions     Diet - low sodium heart healthy   Complete by: As directed    Increase activity slowly   Complete by: As  directed       Allergies as of 06/02/2021       Reactions   Hydrocodone Shortness Of Breath   Latuda [lurasidone Hcl] Anaphylaxis   Magnesium-containing Compounds Anaphylaxis    Tolerated Ensure   Prednisone Anaphylaxis, Swelling, Other (See Comments)   Tongue swelling, lip swelling, throat swelling, per pt    Tramadol Anaphylaxis, Swelling   Codeine Nausea And Vomiting   Trazodone Other (See Comments)   paranoia   Topamax [topiramate] Other (See Comments)   Increases paranoia   Sulfa Antibiotics Itching   Tape Rash        Medication List     STOP taking these medications    cyclobenzaprine 10 MG tablet Commonly known as: FLEXERIL   escitalopram 20 MG tablet Commonly known as: LEXAPRO   gabapentin 100 MG capsule Commonly known as: NEURONTIN   Iron (Ferrous Sulfate) 325 (65 Fe) MG Tabs   oxyCODONE-acetaminophen 5-325 MG tablet Commonly known as: PERCOCET/ROXICET   polyethylene glycol 17 g packet Commonly known as: MIRALAX / GLYCOLAX   senna 8.6 MG Tabs tablet Commonly known as: SENOKOT   sodium chloride 0.65 % Soln nasal spray Commonly known as: OCEAN   torsemide 20 MG tablet Commonly known as: DEMADEX       TAKE these medications    albuterol (2.5 MG/3ML) 0.083% nebulizer solution Commonly known as: PROVENTIL Take 3 mLs (2.5 mg total) by nebulization every 6 (six) hours as needed for wheezing or shortness of breath.   albuterol 108 (90 Base) MCG/ACT inhaler Commonly known as: VENTOLIN HFA Inhale 2 puffs into the lungs every 6 (six) hours as needed for wheezing or shortness of breath.   amLODipine 10 MG tablet Commonly known as: NORVASC Take 10 mg by mouth daily.   Asenapine Maleate 10 MG Subl Place 1 tablet (10 mg total) under the tongue 2 (two) times daily.   atorvastatin 20 MG tablet Commonly known as: LIPITOR Take 1 tablet (20 mg total) by mouth at bedtime.   carvedilol 6.25 MG tablet Commonly known as: COREG Take 1 tablet (6.25 mg total) by mouth 2 (two) times daily with a meal.   colchicine 0.6 MG tablet Take 2 tablets by mouth at the onset of gout flare, may repeat 1 tablet in 1 hour if symptoms persist What  changed:  how much to take how to take this when to take this additional instructions   diazepam 5 MG tablet Commonly known as: VALIUM Take 5 mg by mouth at bedtime.   diclofenac 75 MG EC tablet Commonly known as: VOLTAREN Take 75 mg by mouth 2 (two) times daily as needed for moderate pain.   diclofenac Sodium 1 % Gel Commonly known as: Voltaren Apply 4 g topically 4 (four) times daily as needed (pain).   dicyclomine 10 MG capsule Commonly known as: Bentyl Take 1 capsule (10 mg total) by mouth 3 (three) times daily before meals.   DULoxetine 30 MG capsule Commonly known as: CYMBALTA Take 30 mg by mouth daily.   febuxostat 40 MG tablet Commonly known as: ULORIC Take 40 mg by mouth daily.   furosemide 40 MG tablet Commonly known as: LASIX Take 40 mg by mouth daily.   ketoconazole 2 % cream Commonly known as: NIZORAL Apply 1 application. topically daily. Apply to finger nails   lisdexamfetamine 10 MG capsule Commonly known as: VYVANSE Take 1 capsule (10 mg total) by mouth daily at 12 noon.   loperamide 2 MG capsule Commonly known as: IMODIUM  Take 2 mg by mouth every 4 (four) hours as needed for diarrhea or loose stools.   methocarbamol 500 MG tablet Commonly known as: ROBAXIN Take 1 tablet (500 mg total) by mouth every 8 (eight) hours as needed for up to 14 days for muscle spasms.   montelukast 10 MG tablet Commonly known as: SINGULAIR Take 1 tablet (10 mg total) by mouth at bedtime.   morphine 15 MG 12 hr tablet Commonly known as: MS CONTIN Take 1 tablet (15 mg total) by mouth every 12 (twelve) hours.   multivitamin with minerals Tabs tablet Take 1 tablet by mouth daily.   omeprazole 40 MG capsule Commonly known as: PRILOSEC TAKE 1 CAPSULE(40 MG) BY MOUTH DAILY What changed: See the new instructions.   oxybutynin 5 MG tablet Commonly known as: DITROPAN TAKE 1 TABLET(5 MG) BY MOUTH TWICE DAILY What changed: See the new instructions.   oxyCODONE 5 MG  immediate release tablet Commonly known as: Oxy IR/ROXICODONE Take 1 tablet (5 mg total) by mouth every 4 (four) hours as needed for up to 5 days for severe pain or moderate pain.   potassium chloride SA 20 MEQ tablet Commonly known as: KLOR-CON M Take 1 tablet (20 mEq total) by mouth daily. What changed:  how much to take when to take this   prazosin 2 MG capsule Commonly known as: MINIPRESS TAKE 1 CAPSULE(2 MG) BY MOUTH AT BEDTIME What changed: See the new instructions.   pregabalin 75 MG capsule Commonly known as: LYRICA Take 75 mg by mouth 2 (two) times daily.   terbinafine 1 % cream Commonly known as: LamISIL AT Apply 1 application topically 2 (two) times daily. What changed:  when to take this additional instructions   thiamine 100 MG tablet Take 1 tablet (100 mg total) by mouth daily.   Trelegy Ellipta 100-62.5-25 MCG/ACT Aepb Generic drug: Fluticasone-Umeclidin-Vilant Inhale 1 puff into the lungs daily.        Allergies  Allergen Reactions   Hydrocodone Shortness Of Breath   Latuda [Lurasidone Hcl] Anaphylaxis   Magnesium-Containing Compounds Anaphylaxis    Tolerated Ensure   Prednisone Anaphylaxis, Swelling and Other (See Comments)    Tongue swelling, lip swelling, throat swelling, per pt    Tramadol Anaphylaxis and Swelling   Codeine Nausea And Vomiting   Trazodone Other (See Comments)    paranoia   Topamax [Topiramate] Other (See Comments)    Increases paranoia   Sulfa Antibiotics Itching   Tape Rash    Consultations: Psychiatry   Procedures/Studies: CT Abdomen Pelvis Wo Contrast  Result Date: 05/20/2021 CLINICAL DATA:  Abdominal pain EXAM: CT ABDOMEN AND PELVIS WITHOUT CONTRAST TECHNIQUE: Multidetector CT imaging of the abdomen and pelvis was performed following the standard protocol without IV contrast. RADIATION DOSE REDUCTION: This exam was performed according to the departmental dose-optimization program which includes automated exposure  control, adjustment of the mA and/or kV according to patient size and/or use of iterative reconstruction technique. COMPARISON:  04/24/2021 FINDINGS: Lower Chest: Bibasilar atelectasis Hepatobiliary: Normal hepatic contours. No intra- or extrahepatic biliary dilatation. There is cholelithiasis without acute inflammation. Pancreas: Normal pancreas. No ductal dilatation or peripancreatic fluid collection. Spleen: Normal. Adrenals/Urinary Tract: The adrenal glands are normal. No hydronephrosis, nephroureterolithiasis or solid renal mass. The urinary bladder is normal for degree of distention Stomach/Bowel: There is no hiatal hernia. Normal duodenal course and caliber. No small bowel dilatation or inflammation. No focal colonic abnormality. Normal appendix. Vascular/Lymphatic: There is calcific atherosclerosis of the abdominal aorta. No lymphadenopathy.  Reproductive: Left lower quadrant cystic structure measures 8.2 x 6.2 cm, previously 6.9 x 6.3 cm. Other: None. Musculoskeletal: Progressive endplate destruction at L3-4, particularly of the posteroinferior corner of L3. There is at least mild spinal canal stenosis at this level. No paraspinal abscess. IMPRESSION: 1. Progressive endplate destruction at L3-4, particularly of the posteroinferior corner of L3, consistent with chronic discitis/osteomyelitis. 2. Slight increase in size of left lower quadrant cystic structure. 3. Cholelithiasis without acute inflammation. Aortic Atherosclerosis (ICD10-I70.0). Electronically Signed   By: Ulyses Jarred M.D.   On: 05/20/2021 01:12   (Echo, Carotid, EGD, Colonoscopy, ERCP)    Subjective: Patient seen and examined.  Denies any complaints.  She wants to make sure I prescribe her some pain medications.  Patient tells me that she feels safe to go home.  She tells me she has home health aide and she can arrange as much help at home as needed.  She told me that her mother will come and stay with her and help her out as she had done  in the past.  I offered to call her mom and she declined. Patient requested [name] to arrange transfer to go home.  Discharge Exam: Vitals:   06/02/21 1314 06/02/21 1315  BP: 103/78   Pulse: 71   Resp: 15   Temp: 98.7 F (37.1 C) 98.8 F (37.1 C)  SpO2: 94%    Vitals:   06/01/21 2103 06/02/21 0320 06/02/21 1314 06/02/21 1315  BP: 119/85 114/69 103/78   Pulse: 70 64 71   Resp: '20 12 15   '$ Temp: 98.6 F (37 C) 98 F (36.7 C) 98.7 F (37.1 C) 98.8 F (37.1 C)  TempSrc:   Oral   SpO2: 97% 94% 94%   Weight:  77.7 kg    Height:        General: Pt is alert, awake, not in acute distress On room air.  Looks very comfortable.  Generalized weakness. Cardiovascular: RRR, S1/S2 +, no rubs, no gallops Respiratory: CTA bilaterally, no wheezing, no rhonchi Abdominal: Soft, NT, ND, bowel sounds + Extremities: Chronic nonpitting edema.    The results of significant diagnostics from this hospitalization (including imaging, microbiology, ancillary and laboratory) are listed below for reference.     Microbiology: No results found for this or any previous visit (from the past 240 hour(s)).   Labs: BNP (last 3 results) Recent Labs    04/26/21 0349 04/27/21 0340 04/28/21 0233  BNP 26.4 11.9 94.7   Basic Metabolic Panel: No results for input(s): NA, K, CL, CO2, GLUCOSE, BUN, CREATININE, CALCIUM, MG, PHOS in the last 168 hours. Liver Function Tests: No results for input(s): AST, ALT, ALKPHOS, BILITOT, PROT, ALBUMIN in the last 168 hours. No results for input(s): LIPASE, AMYLASE in the last 168 hours. No results for input(s): AMMONIA in the last 168 hours. CBC: No results for input(s): WBC, NEUTROABS, HGB, HCT, MCV, PLT in the last 168 hours. Cardiac Enzymes: No results for input(s): CKTOTAL, CKMB, CKMBINDEX, TROPONINI in the last 168 hours. BNP: Invalid input(s): POCBNP CBG: No results for input(s): GLUCAP in the last 168 hours. D-Dimer No results for input(s): DDIMER in the  last 72 hours. Hgb A1c No results for input(s): HGBA1C in the last 72 hours. Lipid Profile No results for input(s): CHOL, HDL, LDLCALC, TRIG, CHOLHDL, LDLDIRECT in the last 72 hours. Thyroid function studies No results for input(s): TSH, T4TOTAL, T3FREE, THYROIDAB in the last 72 hours.  Invalid input(s): FREET3 Anemia work up No results for  input(s): VITAMINB12, FOLATE, FERRITIN, TIBC, IRON, RETICCTPCT in the last 72 hours. Urinalysis    Component Value Date/Time   COLORURINE STRAW (A) 05/05/2021 2134   APPEARANCEUR CLEAR 05/05/2021 2134   LABSPEC 1.004 (L) 05/05/2021 2134   PHURINE 7.0 05/05/2021 2134   GLUCOSEU NEGATIVE 05/05/2021 2134   HGBUR NEGATIVE 05/05/2021 2134   BILIRUBINUR NEGATIVE 05/05/2021 2134   BILIRUBINUR NEG 05/21/2014 1155   KETONESUR NEGATIVE 05/05/2021 2134   PROTEINUR NEGATIVE 05/05/2021 2134   UROBILINOGEN 0.2 09/30/2019 1557   NITRITE NEGATIVE 05/05/2021 2134   LEUKOCYTESUR NEGATIVE 05/05/2021 2134   Sepsis Labs Invalid input(s): PROCALCITONIN,  WBC,  LACTICIDVEN Microbiology No results found for this or any previous visit (from the past 240 hour(s)).   Time coordinating discharge: 35 minutes  SIGNED:   Barb Merino, MD  [facility] 06/02/2021, 2:38 PM

## 2021-06-02 NOTE — Progress Notes (Signed)
Physical Therapy Treatment Patient Details Name: Tiffany [name] MRN: [id number] DOB: [birth year] Today's Date: 06/02/2021   History of Present Illness Patient is a 61 year old female with recent D/C from [facility] with cauda equina syndrome. Plan was to D/C to rehab however patient left AMA and has been at home since April 20th. EMS called 5/4 and patient presenting with generalized weakness, generalized body aches, abdominal pain, vomiting and diarrhea x1 week. PMH: coronary artery disease, carotid artery disease, schizophrenia, PTSD, history of cardiac arrest and anoxic brain injury, gout, anemia of chronic disease, GERD, diverticulosis, OSA, chronic pain syndrome, COPD, chronic diastolic CHF, CKD 3B    PT Comments    PT/OT cotreat today to focus on improving functional mobility and determining patient's ability to transfer and care for herself. Patient required MOD assistance for scooting to chair via slide board. Patient unable to manage slide board herself MAX A to place and remove it, but demonstrated good UE strength to perform scooting. She did require chair to be secured by outside force to maintain safety. She transitioned to chair and back to bed (back to bed without slideboard), noted to require less assist/cuing without slideboard (see transfer comments).  Performed ~8 sit to stands with use of stedy and mod +2 to power up, but once in standing and leaning over stedy bar could maintain position with min guard. Demonstrated good standing tolerance for assist with perianal care.   Patient reports she is discharging home and has no concerns. Seems she does not have a lot of assistance and she is unable to detail who and when someone will help her (reports her mom and friends "whenever she calls") can assist, unable to give therapist an answer on whether they can physically assist, just stating "I don't need that much help". Once on her couch - she will be unable to get up by herself. If she is  adamant about going home would recommend maximizing HH services and potentially may need a hospital bed as she uses the rails to assist with bed mobility. Will continue to follow during hospital stay. Pt will benefit from continued skilled PT in order to maximize safety and independence with functional mobility.    Recommendations for follow up therapy are one component of a multi-disciplinary discharge planning process, led by the attending physician.  Recommendations may be updated based on patient status, additional functional criteria and insurance authorization.  Follow Up Recommendations  Skilled nursing-short term rehab (<3 hours/day)     Assistance Recommended at Discharge Frequent or constant Supervision/Assistance  Patient can return home with the following Two people to help with walking and/or transfers;Two people to help with bathing/dressing/bathroom;Help with stairs or ramp for entrance;Assistance with cooking/housework;Direct supervision/assist for financial management;Assist for transportation;Direct supervision/assist for medications management;Assistance with feeding   Equipment Recommendations       Recommendations for Other Services       Precautions / Restrictions Precautions Precautions: Fall Precaution Comments: back precautions due to cauda equina Restrictions Weight Bearing Restrictions: No     Mobility  Bed Mobility Overal bed mobility: Needs Assistance Bed Mobility: Supine to Sit, Sit to Supine     Supine to sit: Min assist, HOB elevated Sit to supine: Max assist, HOB elevated (+3)   General bed mobility comments: heavy use of bed rails and bed features. Able to scoot hips to EOB with increased time    Transfers Overall transfer level: Needs assistance Equipment used: Sliding board, None (stedy) Transfers: Bed to chair/wheelchair/BSC, Sit  to/from Stand Sit to Stand: Mod assist, +2 physical assistance, +2 safety/equipment, From elevated surface           Lateral/Scoot Transfers: Mod assist, +2 safety/equipment, With slide board General transfer comment: Pt performed slide board transfers, MAX A for placement and removal of slideboard. MOD A for transfer and for maintaining slideboard in good position when transferring to drop arm recliner and assist from extra person to keep recliner and bed surface in contact.  cues for head-hip relationship and UE positioning to assist. Performed lateral transfer from recliner back to EOB without use of slideboard and pt with improved performance, MIN A with use of chuck pad by therapist. Pt then transferred from EOB to [facility] with use of stedy and MOD +2 to perform stand from elevated EOB. Total of 7-8 STS during session, pt able to stand for assist with pericare, cues for upright posture and increased knee extension in standing. Pt demonstrated good UE strength and able to progress to MIN guard while standing in stedy with heavy use of B UEs, intermittent leaning on elbows.    Ambulation/Gait                   Stairs             Wheelchair Mobility    Modified Rankin (Stroke Patients Only)       Balance Overall balance assessment: Needs assistance Sitting-balance support: No upper extremity supported, Feet supported Sitting balance-Leahy Scale: Normal     Standing balance support: During functional activity, Reliant on assistive device for balance Standing balance-Leahy Scale: Zero                              Cognition Arousal/Alertness: Awake/alert Behavior During Therapy: WFL for tasks assessed/performed Overall Cognitive Status: Within Functional Limits for tasks assessed                                          Exercises      General Comments General comments (skin integrity, edema, etc.): Pt with 3+/5 quad strength. 0/5 DF bilaterally, 3-/5 hamstring strength, 2-/5 hip flexion      Pertinent Vitals/Pain Pain Assessment Pain  Assessment: Faces Faces Pain Scale: Hurts even more Pain Location: lower extremities Pain Descriptors / Indicators: Grimacing Pain Intervention(s): Limited activity within patient's tolerance, Patient requesting pain meds-RN notified, Monitored during session    Home Living                          Prior Function            PT Goals (current goals can now be found in the care plan section) Acute Rehab PT Goals Patient Stated Goal: "I want to move more" PT Goal Formulation: With patient Time For Goal Achievement: 06/05/21 Potential to Achieve Goals: Fair Progress towards PT goals: Progressing toward goals    Frequency    Min 2X/week      PT Plan Current plan remains appropriate    Co-evaluation PT/OT/SLP Co-Evaluation/Treatment: Yes Reason for Co-Treatment: For patient/therapist safety;To address functional/ADL transfers PT goals addressed during session: Mobility/safety with mobility OT goals addressed during session: ADL's and self-care      AM-PAC PT "6 Clicks" Mobility   Outcome Measure  Help needed turning from your back to your  side while in a flat bed without using bedrails?: A Lot Help needed moving from lying on your back to sitting on the side of a flat bed without using bedrails?: Total Help needed moving to and from a bed to a chair (including a wheelchair)?: Total Help needed standing up from a chair using your arms (e.g., wheelchair or bedside chair)?: Total Help needed to walk in hospital room?: Total Help needed climbing 3-5 steps with a railing? : Total 6 Click Score: 7    End of Session Equipment Utilized During Treatment: Gait belt Activity Tolerance: Patient tolerated treatment well Patient left: in bed;with call bell/phone within reach;with nursing/sitter in room Nurse Communication: Mobility status PT Visit Diagnosis: Muscle weakness (generalized) (M62.81);History of falling (Z91.81);Difficulty in walking, not elsewhere classified  (R26.2);Other abnormalities of gait and mobility (R26.89) Pain - part of body: Leg     Time: 4920-1007 PT Time Calculation (min) (ACUTE ONLY): 52 min  Charges:  $Therapeutic Activity: 8-22 mins                     Festus Barren PT, DPT  Acute Rehabilitation Services  Office [phone]  06/02/2021, 6:23 PM

## 2021-06-02 NOTE — Plan of Care (Signed)
Patient ID: Tiffany [name], female   DOB: 1960-11-26, 61 y.o.   MRN: [id number]  Problem: Education: Goal: Knowledge of General Education information will improve Description: Including pain rating scale, medication(s)/side effects and non-pharmacologic comfort measures Outcome: Adequate for Discharge   Problem: Health Behavior/Discharge Planning: Goal: Ability to manage health-related needs will improve Outcome: Adequate for Discharge   Problem: Clinical Measurements: Goal: Ability to maintain clinical measurements within normal limits will improve Outcome: Adequate for Discharge Goal: Will remain free from infection Outcome: Adequate for Discharge Goal: Diagnostic test results will improve Outcome: Adequate for Discharge Goal: Respiratory complications will improve Outcome: Adequate for Discharge Goal: Cardiovascular complication will be avoided Outcome: Adequate for Discharge   Problem: Activity: Goal: Risk for activity intolerance will decrease Outcome: Adequate for Discharge   Problem: Nutrition: Goal: Adequate nutrition will be maintained Outcome: Adequate for Discharge   Problem: Coping: Goal: Level of anxiety will decrease Outcome: Adequate for Discharge   Problem: Elimination: Goal: Will not experience complications related to bowel motility Outcome: Adequate for Discharge Goal: Will not experience complications related to urinary retention Outcome: Adequate for Discharge   Problem: Pain Managment: Goal: General experience of comfort will improve Outcome: Adequate for Discharge   Problem: Safety: Goal: Ability to remain free from injury will improve Outcome: Adequate for Discharge   Problem: Skin Integrity: Goal: Risk for impaired skin integrity will decrease Outcome: Adequate for Discharge   Problem: Acute Rehab OT Goals (only OT should resolve) Goal: Pt. Will Perform Lower Body Dressing Outcome: Adequate for Discharge Goal: Pt. Will Transfer To  Toilet Outcome: Adequate for Discharge Goal: OT Additional ADL Goal #1 Outcome: Adequate for Discharge   Problem: Acute Rehab PT Goals(only PT should resolve) Goal: Pt Will Go Supine/Side To Sit Outcome: Adequate for Discharge Goal: Pt Will Go Sit To Supine/Side Outcome: Adequate for Discharge Goal: Pt Will Transfer Bed To Chair/Chair To Bed Outcome: Adequate for Discharge    Haydee Salter, RN

## 2021-06-02 NOTE — TOC Transition Note (Signed)
Transition of Care [facility]) - CM/SW Discharge Note   Patient Details  Name: Tiffany [name] MRN: [id number] Date of Birth: 03-06-1960  Transition of Care [facility]) CM/SW Contact:  Bethann Berkshire, [place] Phone Number: 06/02/2021, 3:25 PM   Clinical Narrative:     Slideboard ordered through adapt; drop shipped to home. PTAR called for transport and RN notified. DC packet on chart.   APS worker Antoine Primas notified of DC.  [name] left message with daughter [name] notifying of DC.   Final next level of care: [place] Barriers to Discharge: No Barriers Identified   Patient Goals and CMS Choice Patient states their goals for this hospitalization and ongoing recovery are:: To go to SNF then return back home. CMS Medicare.gov Compare Post Acute Care list provided to:: Patient Choice offered to / list presented to : Patient  Discharge Placement                       Discharge Plan and Services In-house Referral: Clinical Social Work   Post Acute Care Choice: [place]                               Social Determinants of Health (SDOH) Interventions     Readmission Risk Interventions    05/24/2021    6:19 PM 07/26/2020    2:21 PM 02/23/2020    1:16 PM  Readmission Risk Prevention Plan  Transportation Screening Complete Complete Complete  Medication Review Press photographer) Referral to Pharmacy Complete Complete  PCP or Specialist appointment within 3-5 days of discharge Complete Complete Complete  HRI or Home Care Consult Complete Complete Complete  SW Recovery Care/Counseling Consult Complete Complete Complete  Palliative Care Screening  Not Applicable Complete  Skilled Nursing Facility Complete Complete Complete

## 2021-06-02 NOTE — Care Management Important Message (Signed)
Important Message  Patient Details IM Letter given to the Patient. Name: Tiffany [name] MRN: [id number] Date of Birth: 13-Sep-1960   Medicare Important Message Given:  Yes     Kerin Salen 06/02/2021, 10:09 AM

## 2021-06-02 NOTE — Progress Notes (Signed)
Occupational Therapy Treatment Patient Details Name: Tiffany [name] MRN: [id number] DOB: [birth year] Today's Date: 06/02/2021   History of present illness Patient is a 61 year old female with recent D/C from [facility] with cauda equina syndrome. Plan was to D/C to rehab however patient left AMA and has been at home since April 20th. EMS called 5/4 and patient presenting with generalized weakness, generalized body aches, abdominal pain, vomiting and diarrhea x1 week. PMH: coronary artery disease, carotid artery disease, schizophrenia, PTSD, history of cardiac arrest and anoxic brain injury, gout, anemia of chronic disease, GERD, diverticulosis, OSA, chronic pain syndrome, COPD, chronic diastolic CHF, CKD 3B   OT comments  Pt/OT cotreat today to focus on improving functional mobility and determining patient's ability to transfer and care for herself. Patient required assistance for scooting to chair via slide board. Patient unable to manage slide board herself - she could not place it or remove it - but she did scoot well. However, chair had to be secured by outside force for safety. She transitioned to chair and back to bed. Worked on sit to stand with use of stedy and performed approx 8 stands. Needing mod x d to power up but once in standing and leaning over stedy bar could maintain position with min guard. Use of stands with stedy during prolonged toileting task. She attempted with pericare from the front. Patient mostly dependent for task due to narrowness of BSC. She was able to stand and hold position for perianal care. Patient returned to bed at end of treatment. Patient tolerated well. Patient reports she is discharging home and has no concerns. However she does not have a lot of assistance and she is unable to detail who and when someone will help her. Once on her couch - she will be unable to get up by herself. If she is adamant about going home would recommend maximizing HH services and potentially may  need a hospital bed as she uses the rails to transfer with. Will continue to follow acutely.   Recommendations for follow up therapy are one component of a multi-disciplinary discharge planning process, led by the attending physician.  Recommendations may be updated based on patient status, additional functional criteria and insurance authorization.    Follow Up Recommendations  Skilled nursing-short term rehab (<3 hours/day)    Assistance Recommended at Discharge Frequent or constant Supervision/Assistance  Patient can return home with the following  Two people to help with walking and/or transfers;Direct supervision/assist for medications management;Direct supervision/assist for financial management;Assist for transportation;Help with stairs or ramp for entrance;Assistance with cooking/housework;A lot of help with bathing/dressing/bathroom   Equipment Recommendations  Other (comment) (defer to next level of care)    Recommendations for Other Services      Precautions / Restrictions Precautions Precautions: Fall Precaution Comments: back precautions due to cauda equina Restrictions Weight Bearing Restrictions: No       Mobility Bed Mobility                    Transfers                         Balance Overall balance assessment: Needs assistance Sitting-balance support: No upper extremity supported, Feet supported Sitting balance-Leahy Scale: Normal     Standing balance support: During functional activity, Reliant on assistive device for balance Standing balance-Leahy Scale: Zero  ADL either performed or assessed with clinical judgement   ADL Overall ADL's : Needs assistance/impaired             Lower Body Bathing: Maximal assistance Lower Body Bathing Details (indicate cue type and reason): patient able to assist with pericare - though difficult due to narrow BSC as well as wiping upper legs.          Toilet Transfer: Total assistance;+2 for physical assistance;+2 for safety/equipment (with stedy +2 assistance)   Toileting- Clothing Manipulation and Hygiene: Total assistance;Sit to/from stand Toileting - Clothing Manipulation Details (indicate cue type and reason): Performed approx 5 sit to stands with toileting due to prolonged BM, and patient being compacted. Required multiple sit to stands for cleaning and need to return to [facility]. Patietn able to stand and prop on upper extremities while therapist and tech performed pericare.     Functional mobility during ADLs: +2 for physical assistance;+2 for safety/equipment General ADL Comments: Performed 8 sit to stand with stedy today with +2 assistance. Initially needs mod x 2 to power up but once in standing and leaning over stedy bar able to maintain upright position with min guard. +2 assistance for safety at all times once in standing due to potential for LE buckling.    Extremity/Trunk Assessment Upper Extremity Assessment Upper Extremity Assessment: Overall WFL for tasks assessed            Vision Baseline Vision/History: 1 Wears glasses Patient Visual Report: No change from baseline     Perception     Praxis      Cognition Arousal/Alertness: Awake/alert Behavior During Therapy: WFL for tasks assessed/performed Overall Cognitive Status: Within Functional Limits for tasks assessed                                          Exercises      Shoulder Instructions       General Comments      Pertinent Vitals/ Pain       Pain Assessment Pain Assessment: Faces Faces Pain Scale: Hurts even more Pain Location: lower extremities Pain Descriptors / Indicators: Grimacing Pain Intervention(s): Limited activity within patient's tolerance, Monitored during session, Patient requesting pain meds-RN notified  Home Living                                          Prior Functioning/Environment               Frequency  Min 2X/week        Progress Toward Goals  OT Goals(current goals can now be found in the care plan section)  Progress towards OT goals: Progressing toward goals  Acute Rehab OT Goals Patient Stated Goal: i wanna walk again OT Goal Formulation: With patient Time For Goal Achievement: 06/03/21 Potential to Achieve Goals: [place] Discharge plan remains appropriate    Co-evaluation    PT/OT/SLP Co-Evaluation/Treatment: Yes Reason for Co-Treatment: For patient/therapist safety;To address functional/ADL transfers PT goals addressed during session: Mobility/safety with mobility OT goals addressed during session: ADL's and self-care      AM-PAC OT "6 Clicks" Daily Activity     Outcome Measure   Help from another person eating meals?: A Little Help from another person taking care of personal grooming?: A Little Help from  another person toileting, which includes using toliet, bedpan, or urinal?: Total Help from another person bathing (including washing, rinsing, drying)?: A Lot Help from another person to put on and taking off regular upper body clothing?: A Little Help from another person to put on and taking off regular lower body clothing?: Total 6 Click Score: 13    End of Session Equipment Utilized During Treatment:  (stedy)  OT Visit Diagnosis: Other abnormalities of gait and mobility (R26.89);Muscle weakness (generalized) (M62.81)   Activity Tolerance Patient tolerated treatment well   Patient Left in bed;with call bell/phone within reach;with bed alarm set;with nursing/sitter in room   Nurse Communication Mobility status        Time: 1510-1555 OT Time Calculation (min): 45 min  Charges: OT General Charges $OT Visit: 1 Visit OT Treatments $Self Care/Home Management : 23-37 mins  Jailine Lieder, OTR/L [place]  Office [phone] Pager: [place] 06/02/2021, 5:08 PM

## 2021-06-02 NOTE — TOC Progression Note (Signed)
Transition of Care [facility]) - Progression Note    Patient Details  Name: Tiffany [name] MRN: [id number] Date of Birth: 15-Oct-1960  Transition of Care [facility]) CM/SW [place],  Phone Number: 06/02/2021, 10:15 AM  Clinical Narrative:     [name] met with pt to discuss disposition. Explained that SNF is still a possibility using pt's medicaid but that pt would have to commit to 30days at SNF and provide them with SSI check to cover cost. Pt does not want to do this and wants to return home. Pt requests a slide board for transfers; [name] notified OT to see if slide board is appropriate. Pt states she will need PTAR transport. When [name] inquired if anyone will be at her home when she discharges she states, "Honey I have my own key." [name] notified [facility] of DC. MD to order [facility] PT/OT/RN/aide/SW. Will arrange PTAR once slide board is addressed.   Expected Discharge Plan: [place] Barriers to Discharge: No Barriers Identified  Expected Discharge Plan and Services Expected Discharge Plan: [place] In-house Referral: Clinical Social Work   Post Acute Care Choice: [place] Living arrangements for the past 2 months: Apartment                                       Social Determinants of Health (SDOH) Interventions    Readmission Risk Interventions    05/24/2021    6:19 PM 07/26/2020    2:21 PM 02/23/2020    1:16 PM  Readmission Risk Prevention Plan  Transportation Screening Complete Complete Complete  Medication Review Press photographer) Referral to Pharmacy Complete Complete  PCP or Specialist appointment within 3-5 days of discharge Complete Complete Complete  HRI or Home Care Consult Complete Complete Complete  SW Recovery Care/Counseling Consult Complete Complete Complete  Palliative Care Screening  Not Applicable Complete  Skilled Nursing Facility Complete Complete Complete

## 2021-06-03 ENCOUNTER — Other Ambulatory Visit: Payer: Self-pay

## 2021-06-03 DIAGNOSIS — M6281 Muscle weakness (generalized): Secondary | ICD-10-CM | POA: Diagnosis not present

## 2021-06-03 DIAGNOSIS — R062 Wheezing: Secondary | ICD-10-CM | POA: Diagnosis not present

## 2021-06-03 DIAGNOSIS — J45909 Unspecified asthma, uncomplicated: Secondary | ICD-10-CM | POA: Diagnosis not present

## 2021-06-03 DIAGNOSIS — N1832 Chronic kidney disease, stage 3b: Secondary | ICD-10-CM

## 2021-06-03 DIAGNOSIS — J449 Chronic obstructive pulmonary disease, unspecified: Secondary | ICD-10-CM | POA: Diagnosis not present

## 2021-06-03 NOTE — Patient Outreach (Signed)
Received a hospital discharge referral from Netta Cedars, [facility] Liaison. I have assigned [name], RN to call for follow up and determine if there are any Case Management needs.    [place] [facility], [place], [place] Management [phone]

## 2021-06-06 DIAGNOSIS — N1832 Chronic kidney disease, stage 3b: Secondary | ICD-10-CM

## 2021-06-06 HISTORY — DX: Chronic kidney disease, stage 3b: N18.32

## 2021-06-07 ENCOUNTER — Other Ambulatory Visit: Payer: Self-pay | Admitting: *Deleted

## 2021-06-07 NOTE — Patient Outreach (Addendum)
[facility]) Care Management  06/07/2021  Tiffany [name] 06-12-1960 [id number]   Referral Date: 5/19 Referral Source: Hospital liaison Referral Reason: Recent hospital discharge Insurance: [place] attempt #1, unsuccessful.  Member's listed home number, unable to leave message.  Listed cell number state number is not in service.  HIPAA compliant voice message left for daughter.  Plan: RN CM will follow up within the next 2-3 business days.   Update:  Incoming call received back from daughter.  Identity verified.  This care manager introduced self and stated purpose of call.  [name] care management services explained.    State she does not know for certain how member is doing as she lives in [place] and hasn't been to visit since pre-Covid.  She does want some information regarding Medicaid and ALF for member, considering placement.  Will have [name] send information to her at [email or web address].    Per daughter, numbers listed in chart are not accurate, new contact received [phone]).  Call placed, no answer, HIPAA compliant voice message left.  Will follow up in 2 days as planned.  [name], RN, MSN, [place] Manager [phone]

## 2021-06-07 NOTE — Addendum Note (Signed)
Addended byValente David on: 06/07/2021 04:14 PM   Modules accepted: Orders

## 2021-06-08 ENCOUNTER — Emergency Department (HOSPITAL_COMMUNITY): Payer: Medicare Other

## 2021-06-08 ENCOUNTER — Emergency Department (HOSPITAL_COMMUNITY)
Admission: EM | Admit: 2021-06-08 | Discharge: 2021-06-08 | Disposition: A | Discharge status: Home / Self Care | Payer: Medicare Other | Attending: Emergency Medicine | Admitting: Emergency Medicine

## 2021-06-08 ENCOUNTER — Encounter (HOSPITAL_COMMUNITY): Payer: Self-pay

## 2021-06-08 ENCOUNTER — Emergency Department (HOSPITAL_BASED_OUTPATIENT_CLINIC_OR_DEPARTMENT_OTHER): Payer: Medicare Other

## 2021-06-08 ENCOUNTER — Other Ambulatory Visit: Payer: Self-pay

## 2021-06-08 DIAGNOSIS — S80211A Abrasion, right knee, initial encounter: Secondary | ICD-10-CM | POA: Diagnosis not present

## 2021-06-08 DIAGNOSIS — S8991XA Unspecified injury of right lower leg, initial encounter: Secondary | ICD-10-CM | POA: Diagnosis present

## 2021-06-08 DIAGNOSIS — R600 Localized edema: Secondary | ICD-10-CM | POA: Diagnosis not present

## 2021-06-08 DIAGNOSIS — X58XXXA Exposure to other specified factors, initial encounter: Secondary | ICD-10-CM | POA: Insufficient documentation

## 2021-06-08 DIAGNOSIS — R609 Edema, unspecified: Secondary | ICD-10-CM

## 2021-06-08 DIAGNOSIS — L89621 Pressure ulcer of left heel, stage 1: Secondary | ICD-10-CM | POA: Insufficient documentation

## 2021-06-08 DIAGNOSIS — R531 Weakness: Secondary | ICD-10-CM | POA: Diagnosis not present

## 2021-06-08 DIAGNOSIS — Z743 Need for continuous supervision: Secondary | ICD-10-CM | POA: Diagnosis not present

## 2021-06-08 DIAGNOSIS — R2243 Localized swelling, mass and lump, lower limb, bilateral: Secondary | ICD-10-CM | POA: Diagnosis not present

## 2021-06-08 DIAGNOSIS — Z7401 Bed confinement status: Secondary | ICD-10-CM | POA: Diagnosis not present

## 2021-06-08 DIAGNOSIS — R601 Generalized edema: Secondary | ICD-10-CM | POA: Diagnosis not present

## 2021-06-08 DIAGNOSIS — G894 Chronic pain syndrome: Secondary | ICD-10-CM | POA: Diagnosis not present

## 2021-06-08 DIAGNOSIS — M7989 Other specified soft tissue disorders: Secondary | ICD-10-CM | POA: Diagnosis not present

## 2021-06-08 DIAGNOSIS — M1711 Unilateral primary osteoarthritis, right knee: Secondary | ICD-10-CM | POA: Diagnosis not present

## 2021-06-08 LAB — COMPREHENSIVE METABOLIC PANEL
ALT: 23 U/L (ref 0–44)
AST: 34 U/L (ref 15–41)
Albumin: 3.5 g/dL (ref 3.5–5.0)
Alkaline Phosphatase: 120 U/L (ref 38–126)
Anion gap: 9 (ref 5–15)
BUN: 33 mg/dL — ABNORMAL HIGH (ref 8–23)
CO2: 30 mmol/L (ref 22–32)
Calcium: 9.3 mg/dL (ref 8.9–10.3)
Chloride: 99 mmol/L (ref 98–111)
Creatinine, Ser: 1.4 mg/dL — ABNORMAL HIGH (ref 0.44–1.00)
GFR, Estimated: 43 mL/min — ABNORMAL LOW (ref >60)
Glucose, Bld: 102 mg/dL — ABNORMAL HIGH (ref 70–99)
Potassium: 3.9 mmol/L (ref 3.5–5.1)
Sodium: 138 mmol/L (ref 135–145)
Total Bilirubin: 0.6 mg/dL (ref 0.3–1.2)
Total Protein: 7 g/dL (ref 6.5–8.1)

## 2021-06-08 LAB — RAPID URINE DRUG SCREEN, HOSP PERFORMED
Amphetamines: POSITIVE — AB
Barbiturates: NOT DETECTED
Benzodiazepines: POSITIVE — AB
Cocaine: NOT DETECTED
Opiates: POSITIVE — AB
Tetrahydrocannabinol: NOT DETECTED

## 2021-06-08 LAB — URINALYSIS, ROUTINE W REFLEX MICROSCOPIC
Bilirubin Urine: NEGATIVE
Glucose, UA: NEGATIVE mg/dL
Hgb urine dipstick: NEGATIVE
Ketones, ur: NEGATIVE mg/dL
Leukocytes,Ua: NEGATIVE
Nitrite: NEGATIVE
Protein, ur: 30 mg/dL — AB
Specific Gravity, Urine: 1.005 (ref 1.005–1.030)
pH: 6 (ref 5.0–8.0)

## 2021-06-08 LAB — CBC WITH DIFFERENTIAL/PLATELET
Abs Immature Granulocytes: 0.03 10*3/uL (ref 0.00–0.07)
Basophils Absolute: 0 10*3/uL (ref 0.0–0.1)
Basophils Relative: 0 %
Eosinophils Absolute: 0.4 10*3/uL (ref 0.0–0.5)
Eosinophils Relative: 5 %
HCT: 29.5 % — ABNORMAL LOW (ref 36.0–46.0)
Hemoglobin: 9.3 g/dL — ABNORMAL LOW (ref 12.0–15.0)
Immature Granulocytes: 0 %
Lymphocytes Relative: 18 %
Lymphs Abs: 1.3 10*3/uL (ref 0.7–4.0)
MCH: 29 pg (ref 26.0–34.0)
MCHC: 31.5 g/dL (ref 30.0–36.0)
MCV: 91.9 fL (ref 80.0–100.0)
Monocytes Absolute: 0.6 10*3/uL (ref 0.1–1.0)
Monocytes Relative: 9 %
Neutro Abs: 5 10*3/uL (ref 1.7–7.7)
Neutrophils Relative %: 68 %
Platelets: 381 10*3/uL (ref 150–400)
RBC: 3.21 MIL/uL — ABNORMAL LOW (ref 3.87–5.11)
RDW: 15.1 % (ref 11.5–15.5)
WBC: 7.3 10*3/uL (ref 4.0–10.5)
nRBC: 0 % (ref 0.0–0.2)

## 2021-06-08 MED ORDER — TORSEMIDE 20 MG PO TABS
20.0000 mg | ORAL_TABLET | Freq: Once | ORAL | Status: AC
  Administered 2021-06-08: 20 mg via ORAL
  Filled 2021-06-08: qty 1

## 2021-06-08 MED ORDER — OXYCODONE HCL 5 MG PO TABS
5.0000 mg | ORAL_TABLET | Freq: Once | ORAL | Status: AC
  Administered 2021-06-08: 5 mg via ORAL
  Filled 2021-06-08: qty 1

## 2021-06-08 MED ORDER — FUROSEMIDE 10 MG/ML IJ SOLN
40.0000 mg | Freq: Once | INTRAMUSCULAR | Status: DC
Start: 2021-06-08 — End: 2021-06-08

## 2021-06-08 NOTE — ED Notes (Signed)
PTAR called in for transportation back home at this time.

## 2021-06-08 NOTE — ED Notes (Signed)
PTAR at bedside 

## 2021-06-08 NOTE — Discharge Instructions (Signed)
Ultrasound did not show any blood clot.  Continue your current medications.  Follow-up with your doctor
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
## 2021-06-08 NOTE — ED Notes (Signed)
IV attempt x 2 unsuccessful.

## 2021-06-08 NOTE — ED Notes (Signed)
Charge nurse attempted [name] IV x 1 attempt unsuccessful.
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
## 2021-06-08 NOTE — Progress Notes (Signed)
Bilateral lower extremity venous duplex has been completed. Preliminary results can be found in CV Proc through chart review.  Results were given to Dr. Tomi Bamberger.  06/08/21 8:34 AM Tiffany [name] RVT

## 2021-06-08 NOTE — ED Provider Notes (Signed)
Patient initially seen by Dr. Ralene Bathe.  Please see her note.  Plan was to follow-up on the Doppler study.  DVT study is negative.  Evaluation and diagnostic testing in the emergency department does not suggest an emergent condition requiring admission or immediate intervention beyond what has been performed at this time.  The patient is safe for discharge and has been instructed to return immediately for worsening symptoms, change in symptoms or any other concerns.    Dorie Rank, MD 06/08/21 [phone]

## 2021-06-09 ENCOUNTER — Other Ambulatory Visit: Payer: Self-pay | Admitting: *Deleted
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
## 2021-06-10 ENCOUNTER — Other Ambulatory Visit: Payer: Self-pay | Admitting: Family Medicine

## 2021-06-13 ENCOUNTER — Emergency Department (HOSPITAL_COMMUNITY): Payer: Medicare Other

## 2021-06-13 ENCOUNTER — Encounter (HOSPITAL_COMMUNITY): Payer: Self-pay

## 2021-06-13 ENCOUNTER — Emergency Department (HOSPITAL_COMMUNITY)
Admission: EM | Admit: 2021-06-13 | Discharge: 2021-06-14 | Disposition: A | Discharge status: Home / Self Care | Payer: Medicare Other | Attending: Emergency Medicine | Admitting: Emergency Medicine

## 2021-06-13 ENCOUNTER — Other Ambulatory Visit: Payer: Self-pay | Admitting: Family Medicine

## 2021-06-13 ENCOUNTER — Other Ambulatory Visit: Payer: Self-pay

## 2021-06-13 DIAGNOSIS — M79604 Pain in right leg: Secondary | ICD-10-CM | POA: Insufficient documentation

## 2021-06-13 DIAGNOSIS — M79605 Pain in left leg: Secondary | ICD-10-CM | POA: Insufficient documentation

## 2021-06-13 MED ORDER — FENTANYL CITRATE PF 50 MCG/ML IJ SOSY
50.0000 ug | PREFILLED_SYRINGE | Freq: Once | INTRAMUSCULAR | Status: AC
  Administered 2021-06-14: 50 ug via INTRAVENOUS
  Filled 2021-06-13: qty 1

## 2021-06-13 MED ORDER — METHOCARBAMOL 500 MG PO TABS
500.0000 mg | ORAL_TABLET | Freq: Once | ORAL | Status: AC
  Administered 2021-06-14: 500 mg via ORAL
  Filled 2021-06-13: qty 1

## 2021-06-13 NOTE — ED Provider Triage Note (Signed)
Emergency Medicine Provider Triage Evaluation Note  Tiffany [name] , a 61 y.o. female  was evaluated in triage.  Pt complains of bilateral leg pain and swelling for at least 1 year.  Patient was seen in the emergency department 5 days ago and had lab work and bilateral lower extremity ultrasounds which were negative for DVT.  She states that she is taking morphine for the pain.  No chest pain or shortness of breath.  Review of Systems  Positive: Leg pain Negative: Chest pain  Physical Exam  BP (!) 128/118 (BP Location: Left Arm)   Pulse 80   Temp 98.3 F (36.8 C) (Oral)   Resp 16   SpO2 100%  Gen:   Awake, no distress   Resp:  Normal effort  MSK:   Moves extremities without difficulty  Other:  She has a few abrasions on her leg, but no signs of swelling or edema, cellulitis or abscess.  Medical Decision Making  Medically screening exam initiated at 9:28 PM.  Appropriate orders placed.  MAECYN PANNING was informed that the remainder of the evaluation will be completed by another provider, this initial triage assessment does not replace that evaluation, and the importance of remaining in the ED until their evaluation is complete.     Carlisle Cater, PA-C 06/13/21 2129

## 2021-06-13 NOTE — ED Provider Notes (Signed)
[facility] Emergency Department Provider Note MRN:  [id number]  Arrival date & time: 06/14/21     Chief Complaint   Leg Pain   History of Present Illness   Tiffany [name] is a 61 y.o. year-old female presents to the ED with chief complaint of chronic bilateral lower extremity pain.  She states that she fell over the weekend.  Otherwise, she denies any new injuries.  Has known chronic discitis/osteomyelitis of the spine and has been seen by neurosurgery, but was not thought to be a good surgical candidate and patient declined operative intervention.  She denies fever.  Denies incontinence.  She states that the pain today is primarily in her legs.  History provided by patient.   Review of Systems  Pertinent review of systems noted in HPI.    Physical Exam   Vitals:   06/14/21 0045 06/14/21 0115  BP: (!) 153/128 (!) 117/96  Pulse: 89 87  Resp: 15 15  Temp:    SpO2: 93% 96%    CONSTITUTIONAL:  non toxic-appearing, NAD NEURO:  Alert and oriented x 3, CN 3-12 grossly intact EYES:  eyes equal and reactive ENT/NECK:  Supple, no stridor  CARDIO:  normal rate, regular rhythm, appears well-perfused  PULM:  No respiratory distress, CTAB GI/GU:  non-distended,  MSK/SPINE:  No gross deformities, trace edema, minor abrasion to right knee, moving extremities SKIN:  no rash, atraumatic, some skin break down on left heel   *Additional and/or pertinent findings included in MDM below  Diagnostic and Interventional Summary    EKG Interpretation  Date/Time:    Ventricular Rate:    PR Interval:    QRS Duration:   QT Interval:    QTC Calculation:   R Axis:     Text Interpretation:         Labs Reviewed  CBC WITH DIFFERENTIAL/PLATELET - Abnormal; Notable for the following components:      Result Value   RBC 3.76 (*)    Hemoglobin 10.5 (*)    HCT 34.1 (*)    Platelets 426 (*)    All other components within normal limits  BASIC METABOLIC PANEL -  Abnormal; Notable for the following components:   Chloride 97 (*)    CO2 33 (*)    Creatinine, Ser 1.69 (*)    GFR, Estimated 34 (*)    All other components within normal limits    DG Pelvis Portable  Final Result      Medications  fentaNYL (SUBLIMAZE) injection 50 mcg (50 mcg Intravenous Given 06/14/21 0011)  methocarbamol (ROBAXIN) tablet 500 mg (500 mg Oral Given 06/14/21 0011)     Procedures  /  Critical Care Procedures  ED Course and Medical Decision Making  I have reviewed the triage vital signs, the nursing notes, and pertinent available records from the EMR.  Social Determinants Affecting Complexity of Care: Patient has problems living alone.   ED Course:   Patient here with leg pain.  Top differential diagnoses include lumbar radiculopathy, chronic pain, new trauma. Medical Decision Making Today patient returns with her same bilateral leg pain, she states that she gets severe muscle cramps and spasms in her right lower extremity.  She states the pain radiates mainly from her back.  She does have known chronic osteomyelitis versus discitis in her lumbar spine and has been previously seen by neurosurgery, but not felt to be a surgical candidate.  Tonight, her symptoms seem mostly consistent with lumbar radiculopathy.  She does  not exhibit weakness on my exam, does not report incontinence.  I doubt acute cauda equina.  I think that her presentation tonight is more consistent with an exacerbation of her chronic pain.  She will be given a dose of IV pain medicine and Robaxin and discharged home with refill of Robaxin.  She states that she has a phone call with a Education officer, museum tomorrow to help her get in to see some other doctors.  Per RN, patient is nonambulatory at baseline and uses an IT trainer wheelchair.  Problems Addressed: Pain in both lower extremities: chronic illness or injury with exacerbation, progression, or side effects of treatment    Details: Patient with acute on  chronic lower extremity pain.  Amount and/or Complexity of Data Reviewed Labs: ordered.    Details: Baseline HGB, no elevated WBC. Radiology: ordered and independent interpretation performed.    Details: Degenerative changes, no obvious fx or dislocation  Risk Prescription drug management.     Consultants: No consultations were needed in caring for this patient.   Treatment and Plan: Emergency department workup does not suggest an emergent condition requiring admission or immediate intervention beyond  what has been performed at this time. The patient is safe for discharge and has  been instructed to return immediately for worsening symptoms, change in  symptoms or any other concerns    Final Clinical Impressions(s) / ED Diagnoses     ICD-10-CM   1. Pain in both lower extremities  M79.604    M79.605       ED Discharge Orders          Ordered    methocarbamol (ROBAXIN) 500 MG tablet  2 times daily        06/14/21 0103              Discharge Instructions Discussed with and Provided to Patient:   Discharge Instructions   None      Montine Circle, PA-C 06/14/21 2542    Truddie Hidden, MD 06/14/21 [phone]

## 2021-06-13 NOTE — ED Triage Notes (Signed)
Pt BIB EMS. Pt complains of bilateral leg pain x 1 year.

## 2021-06-14 ENCOUNTER — Other Ambulatory Visit: Payer: Self-pay | Admitting: *Deleted

## 2021-06-14 DIAGNOSIS — I5032 Chronic diastolic (congestive) heart failure: Secondary | ICD-10-CM | POA: Diagnosis not present

## 2021-06-14 DIAGNOSIS — G4733 Obstructive sleep apnea (adult) (pediatric): Secondary | ICD-10-CM | POA: Diagnosis not present

## 2021-06-14 DIAGNOSIS — E860 Dehydration: Secondary | ICD-10-CM | POA: Diagnosis not present

## 2021-06-14 DIAGNOSIS — N179 Acute kidney failure, unspecified: Secondary | ICD-10-CM | POA: Diagnosis not present

## 2021-06-14 DIAGNOSIS — M6282 Rhabdomyolysis: Secondary | ICD-10-CM | POA: Diagnosis not present

## 2021-06-14 DIAGNOSIS — M170 Bilateral primary osteoarthritis of knee: Secondary | ICD-10-CM | POA: Diagnosis not present

## 2021-06-14 DIAGNOSIS — J9612 Chronic respiratory failure with hypercapnia: Secondary | ICD-10-CM | POA: Diagnosis not present

## 2021-06-14 DIAGNOSIS — E1122 Type 2 diabetes mellitus with diabetic chronic kidney disease: Secondary | ICD-10-CM | POA: Diagnosis not present

## 2021-06-14 DIAGNOSIS — E441 Mild protein-calorie malnutrition: Secondary | ICD-10-CM | POA: Diagnosis not present

## 2021-06-14 DIAGNOSIS — R45851 Suicidal ideations: Secondary | ICD-10-CM | POA: Diagnosis not present

## 2021-06-14 DIAGNOSIS — J9611 Chronic respiratory failure with hypoxia: Secondary | ICD-10-CM | POA: Diagnosis not present

## 2021-06-14 DIAGNOSIS — F32A Depression, unspecified: Secondary | ICD-10-CM | POA: Diagnosis not present

## 2021-06-14 DIAGNOSIS — J439 Emphysema, unspecified: Secondary | ICD-10-CM | POA: Diagnosis not present

## 2021-06-14 DIAGNOSIS — G931 Anoxic brain damage, not elsewhere classified: Secondary | ICD-10-CM | POA: Diagnosis not present

## 2021-06-14 DIAGNOSIS — R569 Unspecified convulsions: Secondary | ICD-10-CM | POA: Diagnosis not present

## 2021-06-14 DIAGNOSIS — Z20822 Contact with and (suspected) exposure to covid-19: Secondary | ICD-10-CM | POA: Diagnosis not present

## 2021-06-14 DIAGNOSIS — M109 Gout, unspecified: Secondary | ICD-10-CM | POA: Diagnosis not present

## 2021-06-14 DIAGNOSIS — I6529 Occlusion and stenosis of unspecified carotid artery: Secondary | ICD-10-CM | POA: Diagnosis not present

## 2021-06-14 DIAGNOSIS — L89622 Pressure ulcer of left heel, stage 2: Secondary | ICD-10-CM | POA: Diagnosis not present

## 2021-06-14 DIAGNOSIS — Z7401 Bed confinement status: Secondary | ICD-10-CM | POA: Diagnosis not present

## 2021-06-14 DIAGNOSIS — I130 Hypertensive heart and chronic kidney disease with heart failure and stage 1 through stage 4 chronic kidney disease, or unspecified chronic kidney disease: Secondary | ICD-10-CM | POA: Diagnosis not present

## 2021-06-14 DIAGNOSIS — E876 Hypokalemia: Secondary | ICD-10-CM | POA: Diagnosis not present

## 2021-06-14 DIAGNOSIS — N1832 Chronic kidney disease, stage 3b: Secondary | ICD-10-CM | POA: Diagnosis not present

## 2021-06-14 DIAGNOSIS — D631 Anemia in chronic kidney disease: Secondary | ICD-10-CM | POA: Diagnosis not present

## 2021-06-14 DIAGNOSIS — Z9181 History of falling: Secondary | ICD-10-CM | POA: Diagnosis not present

## 2021-06-14 DIAGNOSIS — G894 Chronic pain syndrome: Secondary | ICD-10-CM | POA: Diagnosis not present

## 2021-06-14 DIAGNOSIS — I10 Essential (primary) hypertension: Secondary | ICD-10-CM | POA: Diagnosis not present

## 2021-06-14 LAB — CBC WITH DIFFERENTIAL/PLATELET
Abs Immature Granulocytes: 0.02 10*3/uL (ref 0.00–0.07)
Basophils Absolute: 0 10*3/uL (ref 0.0–0.1)
Basophils Relative: 1 %
Eosinophils Absolute: 0.3 10*3/uL (ref 0.0–0.5)
Eosinophils Relative: 5 %
HCT: 34.1 % — ABNORMAL LOW (ref 36.0–46.0)
Hemoglobin: 10.5 g/dL — ABNORMAL LOW (ref 12.0–15.0)
Immature Granulocytes: 0 %
Lymphocytes Relative: 26 %
Lymphs Abs: 1.4 10*3/uL (ref 0.7–4.0)
MCH: 27.9 pg (ref 26.0–34.0)
MCHC: 30.8 g/dL (ref 30.0–36.0)
MCV: 90.7 fL (ref 80.0–100.0)
Monocytes Absolute: 0.5 10*3/uL (ref 0.1–1.0)
Monocytes Relative: 9 %
Neutro Abs: 3.2 10*3/uL (ref 1.7–7.7)
Neutrophils Relative %: 59 %
Platelets: 426 10*3/uL — ABNORMAL HIGH (ref 150–400)
RBC: 3.76 MIL/uL — ABNORMAL LOW (ref 3.87–5.11)
RDW: 15.3 % (ref 11.5–15.5)
WBC: 5.4 10*3/uL (ref 4.0–10.5)
nRBC: 0 % (ref 0.0–0.2)

## 2021-06-14 LAB — BASIC METABOLIC PANEL
Anion gap: 12 (ref 5–15)
BUN: 21 mg/dL (ref 8–23)
CO2: 33 mmol/L — ABNORMAL HIGH (ref 22–32)
Calcium: 9.8 mg/dL (ref 8.9–10.3)
Chloride: 97 mmol/L — ABNORMAL LOW (ref 98–111)
Creatinine, Ser: 1.69 mg/dL — ABNORMAL HIGH (ref 0.44–1.00)
GFR, Estimated: 34 mL/min — ABNORMAL LOW (ref >60)
Glucose, Bld: 71 mg/dL (ref 70–99)
Potassium: 4.1 mmol/L (ref 3.5–5.1)
Sodium: 142 mmol/L (ref 135–145)

## 2021-06-14 MED ORDER — METHOCARBAMOL 500 MG PO TABS: 500.0000 mg | ORAL_TABLET | Freq: Two times a day (BID) | ORAL | 0 refills | Status: DC

## 2021-06-14 NOTE — Patient Outreach (Signed)
[facility]) Care Management  06/14/2021  Tiffany [name] 1960/04/12 [id number]   Outgoing call placed to member for weekly transition of care and to complete initial assessment, unsuccessful.  Unable to leave voice message on home number, HIPAA compliant voice message left on mobile number.  Will follow up within the next 3-4 business days.  [name], RN, MSN, [employer] [phone]

## 2021-06-14 NOTE — ED Notes (Signed)
[facility] called for pt. Transportation home 

## 2021-06-16 ENCOUNTER — Other Ambulatory Visit: Payer: Self-pay

## 2021-06-16 ENCOUNTER — Encounter (HOSPITAL_COMMUNITY): Payer: Self-pay

## 2021-06-16 ENCOUNTER — Inpatient Hospital Stay (HOSPITAL_COMMUNITY)
Admission: EM | Admit: 2021-06-16 | Discharge: 2021-06-22 | DRG: 683 | Disposition: A | Discharge status: Home Health | Payer: Medicare Other | Attending: Internal Medicine | Admitting: Internal Medicine

## 2021-06-16 ENCOUNTER — Emergency Department (HOSPITAL_COMMUNITY): Payer: Medicare Other

## 2021-06-16 DIAGNOSIS — J439 Emphysema, unspecified: Secondary | ICD-10-CM | POA: Diagnosis present

## 2021-06-16 DIAGNOSIS — M6282 Rhabdomyolysis: Secondary | ICD-10-CM

## 2021-06-16 DIAGNOSIS — G931 Anoxic brain damage, not elsewhere classified: Secondary | ICD-10-CM | POA: Diagnosis present

## 2021-06-16 DIAGNOSIS — E869 Volume depletion, unspecified: Secondary | ICD-10-CM | POA: Diagnosis present

## 2021-06-16 DIAGNOSIS — M1990 Unspecified osteoarthritis, unspecified site: Secondary | ICD-10-CM | POA: Diagnosis present

## 2021-06-16 DIAGNOSIS — M170 Bilateral primary osteoarthritis of knee: Secondary | ICD-10-CM | POA: Diagnosis not present

## 2021-06-16 DIAGNOSIS — F32A Depression, unspecified: Secondary | ICD-10-CM | POA: Diagnosis present

## 2021-06-16 DIAGNOSIS — Z9181 History of falling: Secondary | ICD-10-CM | POA: Diagnosis not present

## 2021-06-16 DIAGNOSIS — R9431 Abnormal electrocardiogram [ECG] [EKG]: Secondary | ICD-10-CM | POA: Diagnosis present

## 2021-06-16 DIAGNOSIS — N179 Acute kidney failure, unspecified: Secondary | ICD-10-CM | POA: Diagnosis not present

## 2021-06-16 DIAGNOSIS — Z79899 Other long term (current) drug therapy: Secondary | ICD-10-CM

## 2021-06-16 DIAGNOSIS — Z8042 Family history of malignant neoplasm of prostate: Secondary | ICD-10-CM

## 2021-06-16 DIAGNOSIS — Z8674 Personal history of sudden cardiac arrest: Secondary | ICD-10-CM

## 2021-06-16 DIAGNOSIS — E876 Hypokalemia: Secondary | ICD-10-CM | POA: Diagnosis present

## 2021-06-16 DIAGNOSIS — Z9981 Dependence on supplemental oxygen: Secondary | ICD-10-CM

## 2021-06-16 DIAGNOSIS — N189 Chronic kidney disease, unspecified: Secondary | ICD-10-CM | POA: Diagnosis present

## 2021-06-16 DIAGNOSIS — E669 Obesity, unspecified: Secondary | ICD-10-CM | POA: Diagnosis present

## 2021-06-16 DIAGNOSIS — E1142 Type 2 diabetes mellitus with diabetic polyneuropathy: Secondary | ICD-10-CM | POA: Diagnosis present

## 2021-06-16 DIAGNOSIS — Z818 Family history of other mental and behavioral disorders: Secondary | ICD-10-CM

## 2021-06-16 DIAGNOSIS — E441 Mild protein-calorie malnutrition: Secondary | ICD-10-CM | POA: Diagnosis present

## 2021-06-16 DIAGNOSIS — E119 Type 2 diabetes mellitus without complications: Secondary | ICD-10-CM

## 2021-06-16 DIAGNOSIS — L89622 Pressure ulcer of left heel, stage 2: Secondary | ICD-10-CM | POA: Diagnosis not present

## 2021-06-16 DIAGNOSIS — Z8673 Personal history of transient ischemic attack (TIA), and cerebral infarction without residual deficits: Secondary | ICD-10-CM

## 2021-06-16 DIAGNOSIS — F5081 Binge eating disorder: Secondary | ICD-10-CM | POA: Diagnosis present

## 2021-06-16 DIAGNOSIS — I5032 Chronic diastolic (congestive) heart failure: Secondary | ICD-10-CM | POA: Diagnosis present

## 2021-06-16 DIAGNOSIS — Z87891 Personal history of nicotine dependence: Secondary | ICD-10-CM

## 2021-06-16 DIAGNOSIS — Z882 Allergy status to sulfonamides status: Secondary | ICD-10-CM

## 2021-06-16 DIAGNOSIS — Z6831 Body mass index (BMI) 31.0-31.9, adult: Secondary | ICD-10-CM

## 2021-06-16 DIAGNOSIS — I2510 Atherosclerotic heart disease of native coronary artery without angina pectoris: Secondary | ICD-10-CM | POA: Diagnosis present

## 2021-06-16 DIAGNOSIS — J449 Chronic obstructive pulmonary disease, unspecified: Secondary | ICD-10-CM | POA: Diagnosis present

## 2021-06-16 DIAGNOSIS — K219 Gastro-esophageal reflux disease without esophagitis: Secondary | ICD-10-CM | POA: Diagnosis present

## 2021-06-16 DIAGNOSIS — G894 Chronic pain syndrome: Secondary | ICD-10-CM | POA: Diagnosis present

## 2021-06-16 DIAGNOSIS — W050XXA Fall from non-moving wheelchair, initial encounter: Secondary | ICD-10-CM | POA: Diagnosis present

## 2021-06-16 DIAGNOSIS — E1122 Type 2 diabetes mellitus with diabetic chronic kidney disease: Secondary | ICD-10-CM | POA: Diagnosis present

## 2021-06-16 DIAGNOSIS — J9611 Chronic respiratory failure with hypoxia: Secondary | ICD-10-CM | POA: Diagnosis present

## 2021-06-16 DIAGNOSIS — F4312 Post-traumatic stress disorder, chronic: Secondary | ICD-10-CM | POA: Diagnosis present

## 2021-06-16 DIAGNOSIS — I6529 Occlusion and stenosis of unspecified carotid artery: Secondary | ICD-10-CM | POA: Diagnosis present

## 2021-06-16 DIAGNOSIS — I252 Old myocardial infarction: Secondary | ICD-10-CM

## 2021-06-16 DIAGNOSIS — Z885 Allergy status to narcotic agent status: Secondary | ICD-10-CM

## 2021-06-16 DIAGNOSIS — E1140 Type 2 diabetes mellitus with diabetic neuropathy, unspecified: Secondary | ICD-10-CM | POA: Diagnosis present

## 2021-06-16 DIAGNOSIS — I10 Essential (primary) hypertension: Secondary | ICD-10-CM | POA: Diagnosis present

## 2021-06-16 DIAGNOSIS — Z801 Family history of malignant neoplasm of trachea, bronchus and lung: Secondary | ICD-10-CM

## 2021-06-16 DIAGNOSIS — W19XXXA Unspecified fall, initial encounter: Secondary | ICD-10-CM

## 2021-06-16 DIAGNOSIS — M109 Gout, unspecified: Secondary | ICD-10-CM | POA: Diagnosis present

## 2021-06-16 DIAGNOSIS — I130 Hypertensive heart and chronic kidney disease with heart failure and stage 1 through stage 4 chronic kidney disease, or unspecified chronic kidney disease: Secondary | ICD-10-CM | POA: Diagnosis present

## 2021-06-16 DIAGNOSIS — R45851 Suicidal ideations: Secondary | ICD-10-CM | POA: Diagnosis present

## 2021-06-16 DIAGNOSIS — N184 Chronic kidney disease, stage 4 (severe): Secondary | ICD-10-CM | POA: Diagnosis present

## 2021-06-16 DIAGNOSIS — E860 Dehydration: Secondary | ICD-10-CM | POA: Diagnosis present

## 2021-06-16 DIAGNOSIS — G4733 Obstructive sleep apnea (adult) (pediatric): Secondary | ICD-10-CM | POA: Diagnosis present

## 2021-06-16 DIAGNOSIS — Z888 Allergy status to other drugs, medicaments and biological substances status: Secondary | ICD-10-CM

## 2021-06-16 DIAGNOSIS — Z20822 Contact with and (suspected) exposure to covid-19: Secondary | ICD-10-CM | POA: Diagnosis present

## 2021-06-16 DIAGNOSIS — D649 Anemia, unspecified: Secondary | ICD-10-CM | POA: Diagnosis present

## 2021-06-16 DIAGNOSIS — Z7951 Long term (current) use of inhaled steroids: Secondary | ICD-10-CM

## 2021-06-16 DIAGNOSIS — D631 Anemia in chronic kidney disease: Secondary | ICD-10-CM | POA: Diagnosis present

## 2021-06-16 DIAGNOSIS — N1832 Chronic kidney disease, stage 3b: Secondary | ICD-10-CM | POA: Diagnosis present

## 2021-06-16 DIAGNOSIS — E785 Hyperlipidemia, unspecified: Secondary | ICD-10-CM | POA: Diagnosis present

## 2021-06-16 DIAGNOSIS — F419 Anxiety disorder, unspecified: Secondary | ICD-10-CM | POA: Diagnosis present

## 2021-06-16 DIAGNOSIS — J9612 Chronic respiratory failure with hypercapnia: Secondary | ICD-10-CM | POA: Diagnosis present

## 2021-06-16 HISTORY — DX: Rhabdomyolysis: M62.82

## 2021-06-16 LAB — COMPREHENSIVE METABOLIC PANEL
ALT: 26 U/L (ref 0–44)
AST: 40 U/L (ref 15–41)
Albumin: 3.4 g/dL — ABNORMAL LOW (ref 3.5–5.0)
Alkaline Phosphatase: 139 U/L — ABNORMAL HIGH (ref 38–126)
Anion gap: 12 (ref 5–15)
BUN: 21 mg/dL (ref 8–23)
CO2: 26 mmol/L (ref 22–32)
Calcium: 9.1 mg/dL (ref 8.9–10.3)
Chloride: 99 mmol/L (ref 98–111)
Creatinine, Ser: 2.16 mg/dL — ABNORMAL HIGH (ref 0.44–1.00)
GFR, Estimated: 25 mL/min — ABNORMAL LOW (ref >60)
Glucose, Bld: 81 mg/dL (ref 70–99)
Potassium: 3.3 mmol/L — ABNORMAL LOW (ref 3.5–5.1)
Sodium: 137 mmol/L (ref 135–145)
Total Bilirubin: 0.7 mg/dL (ref 0.3–1.2)
Total Protein: 7.1 g/dL (ref 6.5–8.1)

## 2021-06-16 LAB — ACETAMINOPHEN LEVEL: Acetaminophen (Tylenol), Serum: <10 ug/mL — ABNORMAL LOW (ref 10–30)

## 2021-06-16 LAB — SALICYLATE LEVEL: Salicylate Lvl: <7 mg/dL — ABNORMAL LOW (ref 7.0–30.0)

## 2021-06-16 LAB — CBC WITH DIFFERENTIAL/PLATELET
Abs Immature Granulocytes: 0.02 10*3/uL (ref 0.00–0.07)
Basophils Absolute: 0 10*3/uL (ref 0.0–0.1)
Basophils Relative: 1 %
Eosinophils Absolute: 0.2 10*3/uL (ref 0.0–0.5)
Eosinophils Relative: 4 %
HCT: 35.1 % — ABNORMAL LOW (ref 36.0–46.0)
Hemoglobin: 10.6 g/dL — ABNORMAL LOW (ref 12.0–15.0)
Immature Granulocytes: 0 %
Lymphocytes Relative: 23 %
Lymphs Abs: 1.4 10*3/uL (ref 0.7–4.0)
MCH: 27.2 pg (ref 26.0–34.0)
MCHC: 30.2 g/dL (ref 30.0–36.0)
MCV: 90.2 fL (ref 80.0–100.0)
Monocytes Absolute: 0.8 10*3/uL (ref 0.1–1.0)
Monocytes Relative: 14 %
Neutro Abs: 3.6 10*3/uL (ref 1.7–7.7)
Neutrophils Relative %: 58 %
Platelets: 309 10*3/uL (ref 150–400)
RBC: 3.89 MIL/uL (ref 3.87–5.11)
RDW: 15.1 % (ref 11.5–15.5)
WBC: 6.1 10*3/uL (ref 4.0–10.5)
nRBC: 0 % (ref 0.0–0.2)

## 2021-06-16 LAB — GLUCOSE, CAPILLARY
Glucose-Capillary: 129 mg/dL — ABNORMAL HIGH (ref 70–99)
Glucose-Capillary: 84 mg/dL (ref 70–99)

## 2021-06-16 LAB — ETHANOL: Alcohol, Ethyl (B): <10 mg/dL (ref <10)

## 2021-06-16 LAB — RESP PANEL BY RT-PCR (FLU A&B, COVID) ARPGX2
Influenza A by PCR: NEGATIVE
Influenza B by PCR: NEGATIVE
SARS Coronavirus 2 by RT PCR: NEGATIVE

## 2021-06-16 LAB — MAGNESIUM: Magnesium: 2.1 mg/dL (ref 1.7–2.4)

## 2021-06-16 LAB — CK: Total CK: 711 U/L — ABNORMAL HIGH (ref 38–234)

## 2021-06-16 MED ORDER — POTASSIUM CHLORIDE CRYS ER 20 MEQ PO TBCR
40.0000 meq | EXTENDED_RELEASE_TABLET | Freq: Once | ORAL | Status: AC
  Administered 2021-06-16: 40 meq via ORAL
  Filled 2021-06-16: qty 2

## 2021-06-16 MED ORDER — OXYCODONE-ACETAMINOPHEN 5-325 MG PO TABS
1.0000 | ORAL_TABLET | Freq: Once | ORAL | Status: AC
  Administered 2021-06-16: 1 via ORAL
  Filled 2021-06-16: qty 1

## 2021-06-16 MED ORDER — SODIUM CHLORIDE 0.9 % IV BOLUS
1000.0000 mL | Freq: Once | INTRAVENOUS | Status: AC
Start: 2021-06-16 — End: 2021-06-16
  Administered 2021-06-16: 1000 mL via INTRAVENOUS

## 2021-06-16 MED ORDER — DIAZEPAM 5 MG PO TABS
5.0000 mg | ORAL_TABLET | Freq: Every evening | ORAL | Status: DC | PRN
Start: 2021-06-16 — End: 2021-06-23
  Administered 2021-06-16 – 2021-06-22 (×6): 5 mg via ORAL
  Filled 2021-06-16 (×7): qty 1

## 2021-06-16 MED ORDER — LACTATED RINGERS IV SOLN
INTRAVENOUS | Status: DC
Start: 2021-06-16 — End: 2021-06-17

## 2021-06-16 MED ORDER — OXYCODONE HCL 5 MG PO TABS
5.0000 mg | ORAL_TABLET | Freq: Four times a day (QID) | ORAL | Status: DC | PRN
  Administered 2021-06-16 – 2021-06-20 (×12): 5 mg via ORAL
  Filled 2021-06-16 (×13): qty 1

## 2021-06-16 MED ORDER — PANTOPRAZOLE SODIUM 40 MG PO TBEC
40.0000 mg | DELAYED_RELEASE_TABLET | Freq: Every day | ORAL | Status: DC
  Administered 2021-06-16 – 2021-06-22 (×7): 40 mg via ORAL
  Filled 2021-06-16 (×7): qty 1

## 2021-06-16 MED ORDER — ACETAMINOPHEN 650 MG RE SUPP: 650.0000 mg | Freq: Four times a day (QID) | RECTAL | Status: DC | PRN

## 2021-06-16 MED ORDER — THIAMINE HCL 100 MG PO TABS
100.0000 mg | ORAL_TABLET | Freq: Every day | ORAL | Status: DC
  Administered 2021-06-16 – 2021-06-22 (×7): 100 mg via ORAL
  Filled 2021-06-16 (×7): qty 1

## 2021-06-16 MED ORDER — ALBUTEROL SULFATE (2.5 MG/3ML) 0.083% IN NEBU: 2.5000 mg | INHALATION_SOLUTION | Freq: Four times a day (QID) | RESPIRATORY_TRACT | Status: DC | PRN

## 2021-06-16 MED ORDER — POTASSIUM CHLORIDE CRYS ER 20 MEQ PO TBCR
20.0000 meq | EXTENDED_RELEASE_TABLET | Freq: Every day | ORAL | Status: DC
  Administered 2021-06-17 – 2021-06-22 (×6): 20 meq via ORAL
  Filled 2021-06-16 (×6): qty 1

## 2021-06-16 MED ORDER — PROCHLORPERAZINE EDISYLATE 10 MG/2ML IJ SOLN: 5.0000 mg | INTRAMUSCULAR | Status: DC | PRN

## 2021-06-16 MED ORDER — ACETAMINOPHEN 325 MG PO TABS
650.0000 mg | ORAL_TABLET | Freq: Four times a day (QID) | ORAL | Status: DC | PRN
  Administered 2021-06-18 – 2021-06-22 (×5): 650 mg via ORAL
  Filled 2021-06-16 (×6): qty 2

## 2021-06-16 MED ORDER — PREGABALIN 75 MG PO CAPS
75.0000 mg | ORAL_CAPSULE | Freq: Two times a day (BID) | ORAL | Status: DC
  Administered 2021-06-16 – 2021-06-22 (×12): 75 mg via ORAL
  Filled 2021-06-16 (×12): qty 1

## 2021-06-16 NOTE — ED Notes (Signed)
Pt says she has a burning pain in her legs

## 2021-06-16 NOTE — H&P (Signed)
History and Physical    Patient: Tiffany [name] EXH:[id number] DOB: 1960-06-24 DOA: 06/16/2021 DOS: the patient was seen and examined on 06/16/2021 PCP: [name], MD  Patient coming from: Home  Chief Complaint: No chief complaint on file.  HPI: Tiffany [name] is a 61 y.o. female with medical history significant of anoxic brain injury, anxiety, osteoarthritis, asthma, binge eating disorder, carotid artery stenosis, chronic diastolic CHF, stage III CKD, chronic pain syndrome, chronic posttraumatic stress disorder, chronic respiratory failure with hypoxia and hypercapnia, cocaine use, COPD, depression, type 2 diabetes, diabetic neuropathy, essential hypertension, elevated troponin, hyperlipidemia, GERD, gout, CAD, history of NSTEMI, seasonal allergies, schizoaffective disorder, seborrheic keratosis, sinus bradycardia, history of other nonhemorrhagic stroke, oral Trausch who is coming to the emergency department via EMS due to having suicidal ideations because of her chronic pain, living in family situation.  She also expressed wanting to harm her mother.  She stated that her family are taking advantage of and stealing things from her.  She did not specify any instances when asked about what her family is doing to her.  She stated that she fell from her chair about 3 days ago and no one from her family helped her to get up.  She denied fever, chills, rhinorrhea, sore throat, wheezing or hemoptysis, but complains of body aches.  No chest pain, palpitations, diaphoresis, PND, orthopnea or pitting edema of the lower extremities.  No abdominal pain, nausea, emesis, diarrhea, constipation, melena or hematochezia.  No flank pain, dysuria, frequency or hematuria.  No polyuria, polydipsia, polyphagia or blurred vision.   ED course: Initial vital signs were temperature 98.4 F, pulse 66, respiration 18, BP 116/86 mmHg O2 sat 95% on room air.  The patient received 1000 mL of normal saline, 40 mEq of sodium  chloride and 1 tablet of Percocet 5/325 mg while in the emergency department.  Lab work: CBC showed a white count 6.1, hemoglobin 10.6 g/dL platelets 309.  Total CK was 711.  Normal acetaminophen, salicylate and alcohol.  Negative coronavirus and influenza PCR.  CMP with a potassium of 3.3 mmol/L, creatinine 2.16 mg/dL, albumin 3.4 g/dL and alkaline phosphatase 139 units/L.  The rest of the CMP measurements were within expected range.  Imaging: CT head without contrast showed periventricular white matter and corona radiata hypodensities favoring chronic ischemic microvascular white matter disease.  There was atherosclerosis.  Small left mastoid effusion, but no acute intracranial findings.   Review of Systems: As mentioned in the history of present illness. All other systems reviewed and are negative.  Past Medical History:  Diagnosis Date   Agitation 11/22/2017   Anoxic brain injury ([place]) 09/08/2016   C. Arrest due to respiratory failure and COPD exacerbation   Anxiety    Arthritis    "all over" (04/10/2016)   Asthma 10/18/2010   Binge eating disorder    Cardiac arrest ([place]) 09/08/2016   PEA   Carotid artery stenosis    1-39% bilateral by dopplers 11/2016   Chronic diastolic (congestive) heart failure (HCC)    Chronic kidney disease, stage 3 (HCC)    Chronic pain syndrome 06/18/2012   Chronic post-traumatic stress disorder (PTSD) 05/27/2018   Chronic respiratory failure with hypoxia and hypercapnia ([place]) 06/22/2015   TRILOGY Vent >AVAPA-ES., Vt target 200-400, Max P 30 , PS max 20 , PS min 6-10 , E Max 6, E Min 4, Rate Auto AVAPS Rate 2 (titrate for pt comfort) , bleed O2 at 5l/m continuous flow .    Closed  displaced fracture of fifth metacarpal bone 03/21/2018   Cocaine use disorder, severe, in sustained remission ([place]) 45/40/9811   Complication of anesthesia    decreased bp, decreased heart rate   COPD (chronic obstructive pulmonary disease) ([place]) 07/08/2014   Depression     Diabetic neuropathy ([place]) 04/24/2011   Difficulty with speech 01/24/2018   Disorder of nervous system    Drug abuse ([place]) 11/21/2017   Dyslipidemia 04/24/2011   Elevated troponin 04/28/2012   Emphysema    Encephalopathy 11/21/2017   Essential hypertension 03/22/2016   Fibula fracture 07/10/2016   Frequent falls 10/11/2017   GERD (gastroesophageal reflux disease)    Gout 04/11/2017   Heart attack ([place]) 1980s   History of blood transfusion 1994   "couldn't stop bleeding from my period"   History of drug abuse in remission ([place]) 11/28/2015   Quit in 2017   Hyperlipidemia LDL goal <70    Incontinence    Manic depression ([place])    Morbid obesity ([place]) 10/18/2010   Obstructive sleep apnea 10/18/2010   On home oxygen therapy    "6L; 24/7" (04/10/2016)   OSA on CPAP    "wear mask sometimes" (04/10/2016)   Paranoid ([place])    "sometimes; I'm on RX for it" (04/10/2016)   Prolonged Q-T interval on ECG    Rectal bleeding 12/31/2015   Schizoaffective disorder, bipolar type ([place]) 04/05/2018   Seasonal allergies    Seborrheic keratoses 12/31/2013   Seizures ([place])    "don't know what kind; last one was ~ 1 yr ago" (04/10/2016)   Sinus bradycardia    Stroke [facility]) 1980s   denies residual on 04/10/2016   Thrush 09/19/2013   Type 2 diabetes mellitus ([place]) 10/18/2010   Past Surgical History:  Procedure Laterality Date   CESAREAN SECTION  1997   COLONOSCOPY WITH PROPOFOL N/A 04/01/2021   Procedure: COLONOSCOPY WITH PROPOFOL;  Surgeon: Carol Ada, MD;  Location: [place];  Service: Gastroenterology;  Laterality: N/A;  Rectal bleeding with drop in hemoglobin to 7.2 g/dL   HERNIA REPAIR     IR CHOLANGIOGRAM EXISTING TUBE  07/20/2016   IR PERC CHOLECYSTOSTOMY  05/10/2016   IR RADIOLOGIST EVAL & MGMT  06/08/2016   IR RADIOLOGIST EVAL & MGMT  06/29/2016   IR SINUS/FIST TUBE CHK-NON GI  07/12/2016   RIGHT/LEFT HEART CATH AND CORONARY ANGIOGRAPHY N/A 06/19/2017   Procedure: RIGHT/LEFT HEART CATH AND  CORONARY ANGIOGRAPHY;  Surgeon: Jolaine Artist, MD;  Location: [place] [facility];  Service: Cardiovascular;  Laterality: N/A;   TIBIA IM NAIL INSERTION Right 07/12/2016   Procedure: INTRAMEDULLARY (IM) NAIL RIGHT TIBIA;  Surgeon: Leandrew Koyanagi, MD;  Location: [place];  Service: Orthopedics;  Laterality: Right;   UMBILICAL HERNIA REPAIR  ~ 1963   "that's why I don't have a belly button"   VAGINAL HYSTERECTOMY     Social History:  reports that she quit smoking about 5 years ago. Her smoking use included cigarettes. She started smoking about 44 years ago. She has a 57.00 pack-year smoking history. She has never used smokeless tobacco. She reports that she does not currently use drugs after having used the following drugs: Cocaine. She reports that she does not drink alcohol.  Allergies  Allergen Reactions   Hydrocodone Shortness Of Breath   Latuda [Lurasidone Hcl] Anaphylaxis   Magnesium-Containing Compounds Anaphylaxis    Tolerated Ensure   Prednisone Anaphylaxis, Swelling and Other (See Comments)    Tongue swelling, lip swelling, throat swelling, per pt  Tramadol Anaphylaxis and Swelling   Codeine Nausea And Vomiting   Trazodone Other (See Comments)    paranoia   Topamax [Topiramate] Other (See Comments)    Increases paranoia   Sulfa Antibiotics Itching   Tape Rash    Family History  Problem Relation Age of Onset   Cancer Mother        lung   Depression Mother    Cancer Father        prostate   Depression Sister    Anxiety disorder Sister    Schizophrenia Sister    Bipolar disorder Sister    Depression Sister    Depression Brother    Heart failure Other        cousin    Prior to Admission medications   Medication Sig Start Date End Date Taking? Authorizing Provider  albuterol (PROVENTIL) (2.5 MG/3ML) 0.083% nebulizer solution Take 3 mLs (2.5 mg total) by nebulization every 6 (six) hours as needed for wheezing or shortness of breath. 12/16/20   [name], MD   albuterol (VENTOLIN HFA) 108 (90 Base) MCG/ACT inhaler Inhale 2 puffs into the lungs every 6 (six) hours as needed for wheezing or shortness of breath. 02/09/21   [name], MD  amLODipine (NORVASC) 10 MG tablet Take 10 mg by mouth daily.    [provider]  Asenapine Maleate 10 MG SUBL Place 1 tablet (10 mg total) under the tongue 2 (two) times daily. 04/06/21   Shelly Coss, MD  atorvastatin (LIPITOR) 20 MG tablet TAKE 1 TABLET(20 MG) BY MOUTH DAILY 06/10/21   [name], MD  carvedilol (COREG) 6.25 MG tablet Take 1 tablet (6.25 mg total) by mouth 2 (two) times daily with a meal. 04/19/21   [name], MD  colchicine 0.6 MG tablet Take 2 tablets by mouth at the onset of gout flare, may repeat 1 tablet in 1 hour if symptoms persist Patient taking differently: Take 0.6 mg by mouth every morning. 01/14/21   Mercy Riding, MD  diazepam (VALIUM) 5 MG tablet Take 5 mg by mouth at bedtime. 05/18/21   [provider]  diclofenac (VOLTAREN) 75 MG EC tablet Take 75 mg by mouth 2 (two) times daily as needed for moderate pain. 05/15/21   [provider]  diclofenac Sodium (VOLTAREN) 1 % GEL Apply 4 g topically 4 (four) times daily as needed (pain). 04/19/21   [name], MD  dicyclomine (BENTYL) 10 MG capsule Take 1 capsule (10 mg total) by mouth 3 (three) times daily before meals. 12/24/20   [name], MD  DULoxetine (CYMBALTA) 30 MG capsule Take 30 mg by mouth daily. 03/22/21   [provider]  escitalopram (LEXAPRO) 20 MG tablet Take 20 mg by mouth every morning.    [provider]  febuxostat (ULORIC) 40 MG tablet Take 40 mg by mouth daily. 05/07/21   [provider]  Fluticasone-Umeclidin-Vilant (TRELEGY ELLIPTA) 100-62.5-25 MCG/ACT AEPB Inhale 1 puff into the lungs daily. 01/14/21   Mercy Riding, MD  Iron, Ferrous Sulfate, 325 (65 Fe) MG TABS Take 325 mg by mouth daily. 01/14/21   Mercy Riding, MD  ketoconazole (NIZORAL) 2 % cream  Apply 1 application. topically daily. Apply to finger nails 09/27/20   [provider]  lisdexamfetamine (VYVANSE) 10 MG capsule Take 1 capsule (10 mg total) by mouth daily at 12 noon. 04/06/21   Shelly Coss, MD  loperamide (IMODIUM) 2 MG capsule Take 2 mg by mouth every 4 (four) hours as  needed for diarrhea or loose stools. 05/18/21   [provider]  methocarbamol (ROBAXIN) 500 MG tablet Take 1 tablet (500 mg total) by mouth 2 (two) times daily. 06/14/21   Montine Circle, PA-C  montelukast (SINGULAIR) 10 MG tablet Take 1 tablet (10 mg total) by mouth at bedtime. 01/14/21   Mercy Riding, MD  Multiple Vitamin (MULTIVITAMIN WITH MINERALS) TABS tablet Take 1 tablet by mouth daily. 01/14/21   Mercy Riding, MD  omeprazole (PRILOSEC) 40 MG capsule TAKE 1 CAPSULE(40 MG) BY MOUTH DAILY Patient taking differently: Take 40 mg by mouth every morning. 10/23/20   [name], MD  oxybutynin (DITROPAN) 5 MG tablet TAKE 1 TABLET(5 MG) BY MOUTH TWICE DAILY Patient taking differently: Take 5 mg by mouth 2 (two) times daily. 10/23/20   [name], MD  potassium chloride SA (KLOR-CON M) 20 MEQ tablet Take 1 tablet (20 mEq total) by mouth daily. Patient taking differently: Take 40 mEq by mouth 2 (two) times daily. 01/17/21 05/20/21  Mercy Riding, MD  prazosin (MINIPRESS) 2 MG capsule TAKE 1 CAPSULE(2 MG) BY MOUTH AT BEDTIME Patient taking differently: Take 2 mg by mouth at bedtime. 05/16/21   [name], MD  pregabalin (LYRICA) 75 MG capsule Take 75 mg by mouth 2 (two) times daily. 05/15/21   [provider]  terbinafine (LAMISIL AT) 1 % cream Apply 1 application topically 2 (two) times daily. Patient taking differently: Apply 1 application. topically 3 (three) times daily. Apply to feet 09/07/20   [name], MD  thiamine 100 MG tablet Take 1 tablet (100 mg total) by mouth daily. 02/27/20   Hongalgi, Lenis Dickinson, MD  torsemide (DEMADEX) 20 MG tablet Take 1 tablet (20 mg total) by  mouth daily. 06/02/21 07/02/21  Barb Merino, MD    Physical Exam: Vitals:   06/16/21 1114 06/16/21 1116 06/16/21 1228 06/16/21 1401  BP:  116/86 113/81 127/88  Pulse:  66 69 69  Resp:  '18 18 18  '$ Temp:  98.4 F (36.9 C)    TempSrc:  Oral    SpO2:  95% 93% 100%  Weight: 78 kg     Height: '5\' 2"'$  (1.575 m)      Physical Exam Vitals and nursing note reviewed.  Constitutional:      Appearance: [name] obese.  HENT:     Head: Normocephalic and atraumatic.     Mouth/Throat:     Mouth: Mucous membranes are moist.  Eyes:     General: No scleral icterus.    Pupils: Pupils are equal, round, and reactive to light.  Cardiovascular:     Rate and Rhythm: Normal rate and regular rhythm.  Pulmonary:     Effort: Pulmonary effort is normal.     Breath sounds: Normal breath sounds. No wheezing or rales.  Abdominal:     General: Bowel sounds are normal. There is no distension.     Palpations: Abdomen is soft.     Tenderness: There is no abdominal tenderness. There is no right CVA tenderness, left CVA tenderness or guarding.  Musculoskeletal:     Cervical back: Neck supple.     Right lower leg: No edema.     Left lower leg: No edema.  Skin:    General: Skin is warm and dry.  Neurological:     General: No focal deficit present.     Mental Status: [name] alert and oriented to person, place, and time.  Psychiatric:  Mood and Affect: Mood is anxious and depressed.        Behavior: Behavior normal. Behavior is cooperative.        Thought Content: Thought content includes homicidal and suicidal ideation. Thought content includes homicidal and suicidal plan.   Data Reviewed:  There are no new results to review at this time.  Assessment and Plan: Principal Problem:   AKI (acute kidney injury) ([place]) Superimposed on   Chronic kidney disease, stage 3b ([place]) Observation/MedSurg. Continue IV fluids. Hold torsemide. Avoid hypotension. Avoid nephrotoxins. Monitor intake and  output. Monitor renal function electrolytes.  Active Problems:   Rhabdomyolysis Continue IV fluids. Follow-up total CK in the morning.    QT prolongation Optimize electrolytes. Avoid QT prolonging meds. History of anaphylaxis to magnesium compound.    Hypokalemia Supplementing. Resume daily potassium supplement. Follow potassium level in AM.    Dyslipidemia Hold therapy in the setting of rhabdomyolysis.    Diabetic neuropathy (HCC) Resume pregabalin twice daily.    Type 2 diabetes mellitus (HCC) Carbohydrate modified diet. CBG monitoring before meals and bedtime.    COPD (chronic obstructive pulmonary disease) (HCC) Supplemental oxygen and bronchodilators as needed.    Chronic diastolic congestive heart failure ([place]) The patient is volume depleted. Consider resuming beta-blocker in AM. Continue holding diuretic.    Essential hypertension Resume meds in AM. Monitor blood pressure.    Anxiety and depression Presenting with SI and HI. One-to-one observation. Consult TOC team.    Normocytic anemia Monitor H&H.    Mild protein calorie malnutrition Protein supplementation. Consider nutritional services evaluation.      Advance Care Planning:   Code Status: Full Code   Consults: Behavioral health.  Family Communication:   Severity of Illness: The appropriate patient status for this patient is OBSERVATION. Observation status is judged to be reasonable and necessary in order to provide the required intensity of service to ensure the patient's safety. The patient's presenting symptoms, physical exam findings, and initial radiographic and laboratory data in the context of their medical condition is felt to place them at decreased risk for further clinical deterioration. Furthermore, it is anticipated that the patient will be medically stable for discharge from the hospital within 2 midnights of admission.   Author: Reubin Milan, MD 06/16/2021 4:47 PM  For on  call review [email or web address].   This document was prepared with [employer] and may contain some unintended transcription errors.

## 2021-06-16 NOTE — ED Provider Notes (Signed)
[facility] DEPT Provider Note   CSN: [id number] Arrival date & time: 06/16/21  1106     History PMH: HLD, diabetes type 2, CKD stage 3, Anxiety, COPD, OSA, hx CVA, CHF, Carotid Artery stenosis, HTN, Schizoaffective Disorder, Prolonged QT  No chief complaint on file.   Tiffany [name] is a 61 y.o. female. Presents the ED with chief complaint of suicidal ideations.  She states that she has been feeling like she wants to hurt herself because she is having so much pain.  She feels like she has been treated well in life either.  She says she plans to slit her throat.  He also feels like her family has not been supportive of her.  She also says she feels like she wants to hurt her mom because her mom has not been very nice to her and does really "nasty things". She did not elaborate on this. She notes that about 3 days ago she fell from her wheelchair and reportedly was on the ground for 3 days because none of her family would help her up until EMS came and brought her to the emergency room where she was evaluated and had a normal workup.  She said she fell again last night from her wheelchair and hit her head.  She does not remember losing consciousness.  She denies any use of blood thinners.  She denies any headaches, numbness, weakness, speech changes, visual impairment, or dizziness. She also complains of bilateral leg pain from her knees down to her feet.  She says this is been ongoing from 3 to 4 years and that she has been seen numerous times for this.  She normally treats it with oxycodone.  There has been no change in the symptoms.  HPI     Home Medications Prior to Admission medications   Medication Sig Start Date End Date Taking? Authorizing Provider  albuterol (PROVENTIL) (2.5 MG/3ML) 0.083% nebulizer solution Take 3 mLs (2.5 mg total) by nebulization every 6 (six) hours as needed for wheezing or shortness of breath. 12/16/20   [name], MD   albuterol (VENTOLIN HFA) 108 (90 Base) MCG/ACT inhaler Inhale 2 puffs into the lungs every 6 (six) hours as needed for wheezing or shortness of breath. 02/09/21   [name], MD  amLODipine (NORVASC) 10 MG tablet Take 10 mg by mouth daily.    [provider]  Asenapine Maleate 10 MG SUBL Place 1 tablet (10 mg total) under the tongue 2 (two) times daily. 04/06/21   Shelly Coss, MD  atorvastatin (LIPITOR) 20 MG tablet TAKE 1 TABLET(20 MG) BY MOUTH DAILY 06/10/21   [name], MD  carvedilol (COREG) 6.25 MG tablet Take 1 tablet (6.25 mg total) by mouth 2 (two) times daily with a meal. 04/19/21   [name], MD  colchicine 0.6 MG tablet Take 2 tablets by mouth at the onset of gout flare, may repeat 1 tablet in 1 hour if symptoms persist Patient taking differently: Take 0.6 mg by mouth every morning. 01/14/21   Mercy Riding, MD  diazepam (VALIUM) 5 MG tablet Take 5 mg by mouth at bedtime. 05/18/21   [provider]  diclofenac (VOLTAREN) 75 MG EC tablet Take 75 mg by mouth 2 (two) times daily as needed for moderate pain. 05/15/21   [provider]  diclofenac Sodium (VOLTAREN) 1 % GEL Apply 4 g topically 4 (four) times daily as needed (pain). 04/19/21   [name], MD  dicyclomine (BENTYL) 10  MG capsule Take 1 capsule (10 mg total) by mouth 3 (three) times daily before meals. 12/24/20   [name], MD  DULoxetine (CYMBALTA) 30 MG capsule Take 30 mg by mouth daily. 03/22/21   [provider]  escitalopram (LEXAPRO) 20 MG tablet Take 20 mg by mouth every morning.    [provider]  febuxostat (ULORIC) 40 MG tablet Take 40 mg by mouth daily. 05/07/21   [provider]  Fluticasone-Umeclidin-Vilant (TRELEGY ELLIPTA) 100-62.5-25 MCG/ACT AEPB Inhale 1 puff into the lungs daily. 01/14/21   Mercy Riding, MD  Iron, Ferrous Sulfate, 325 (65 Fe) MG TABS Take 325 mg by mouth daily. 01/14/21   Mercy Riding, MD  ketoconazole (NIZORAL) 2 % cream  Apply 1 application. topically daily. Apply to finger nails 09/27/20   [provider]  lisdexamfetamine (VYVANSE) 10 MG capsule Take 1 capsule (10 mg total) by mouth daily at 12 noon. 04/06/21   Shelly Coss, MD  loperamide (IMODIUM) 2 MG capsule Take 2 mg by mouth every 4 (four) hours as needed for diarrhea or loose stools. 05/18/21   [provider]  methocarbamol (ROBAXIN) 500 MG tablet Take 1 tablet (500 mg total) by mouth 2 (two) times daily. 06/14/21   Montine Circle, PA-C  montelukast (SINGULAIR) 10 MG tablet Take 1 tablet (10 mg total) by mouth at bedtime. 01/14/21   Mercy Riding, MD  Multiple Vitamin (MULTIVITAMIN WITH MINERALS) TABS tablet Take 1 tablet by mouth daily. 01/14/21   Mercy Riding, MD  omeprazole (PRILOSEC) 40 MG capsule TAKE 1 CAPSULE(40 MG) BY MOUTH DAILY Patient taking differently: Take 40 mg by mouth every morning. 10/23/20   [name], MD  oxybutynin (DITROPAN) 5 MG tablet TAKE 1 TABLET(5 MG) BY MOUTH TWICE DAILY Patient taking differently: Take 5 mg by mouth 2 (two) times daily. 10/23/20   [name], MD  potassium chloride SA (KLOR-CON M) 20 MEQ tablet Take 1 tablet (20 mEq total) by mouth daily. Patient taking differently: Take 40 mEq by mouth 2 (two) times daily. 01/17/21 05/20/21  Mercy Riding, MD  prazosin (MINIPRESS) 2 MG capsule TAKE 1 CAPSULE(2 MG) BY MOUTH AT BEDTIME Patient taking differently: Take 2 mg by mouth at bedtime. 05/16/21   [name], MD  pregabalin (LYRICA) 75 MG capsule Take 75 mg by mouth 2 (two) times daily. 05/15/21   [provider]  terbinafine (LAMISIL AT) 1 % cream Apply 1 application topically 2 (two) times daily. Patient taking differently: Apply 1 application. topically 3 (three) times daily. Apply to feet 09/07/20   [name], MD  thiamine 100 MG tablet Take 1 tablet (100 mg total) by mouth daily. 02/27/20   Hongalgi, Lenis Dickinson, MD  torsemide (DEMADEX) 20 MG tablet Take 1 tablet (20 mg total) by  mouth daily. 06/02/21 07/02/21  Barb Merino, MD      Allergies    Hydrocodone, Anette Guarneri [lurasidone hcl], Magnesium-containing compounds, Prednisone, Tramadol, Codeine, Trazodone, Topamax [topiramate], Sulfa antibiotics, and Tape    Review of Systems   Review of Systems  Musculoskeletal:  Positive for myalgias.  Psychiatric/Behavioral:  Positive for suicidal ideas. Negative for confusion and hallucinations.   All other systems reviewed and are negative.  Physical Exam Updated Vital Signs BP 127/88 (BP Location: Left Arm)   Pulse 69   Temp 98.4 F (36.9 C) (Oral)   Resp 18   Ht '5\' 2"'  (1.575 m)   Wt 78 kg   SpO2 100%   BMI 31.45  kg/m  Physical Exam Vitals and nursing note reviewed.  Constitutional:      General: [name] not in acute distress.    Appearance: Normal appearance. [name] well-developed. [name] not ill-appearing, toxic-appearing or diaphoretic.  HENT:     Head: Normocephalic and atraumatic.     Comments: NO evidence of head trauma    Ears:     Comments: No battles sign or ottorhea.     Nose: No nasal deformity or rhinorrhea.     Mouth/Throat:     Lips: Pink. No lesions.     Mouth: Mucous membranes are dry.     Pharynx: Oropharynx is clear. No oropharyngeal exudate or posterior oropharyngeal erythema.  Eyes:     General: Gaze aligned appropriately. No scleral icterus.       Right eye: No discharge.        Left eye: No discharge.     Extraocular Movements: Extraocular movements intact.     Conjunctiva/sclera: Conjunctivae normal.     Right eye: Right conjunctiva is not injected. No exudate or hemorrhage.    Left eye: Left conjunctiva is not injected. No exudate or hemorrhage.    Comments: Pupils 5 mm and sluggish bilaterally, Round and equal.   Cardiovascular:     Rate and Rhythm: Normal rate and regular rhythm.     Pulses: Normal pulses.     Heart sounds: Normal heart sounds. No murmur heard.   No friction rub. No gallop.  Pulmonary:     Effort: Pulmonary  effort is normal. No respiratory distress.     Breath sounds: Normal breath sounds. No stridor. No wheezing, rhonchi or rales.  Abdominal:     General: Abdomen is flat. There is no distension.     Palpations: Abdomen is soft.     Tenderness: There is no abdominal tenderness. There is no right CVA tenderness, left CVA tenderness or guarding.  Musculoskeletal:     Right lower leg: No edema.     Left lower leg: No edema.     Comments: No C spine tenderness or stepoffs. ROM normal.  Skin:    General: Skin is warm and dry.  Neurological:     General: No focal deficit present.     Mental Status: [name] alert and oriented to person, place, and time. Mental status is at baseline.  Psychiatric:        Mood and Affect: Mood normal.        Speech: Speech normal.        Behavior: Behavior normal. Behavior is cooperative.    ED Results / Procedures / Treatments   Labs (all labs ordered are listed, but only abnormal results are displayed) Labs Reviewed  COMPREHENSIVE METABOLIC PANEL - Abnormal; Notable for the following components:      Result Value   Potassium 3.3 (*)    Creatinine, Ser 2.16 (*)    Albumin 3.4 (*)    Alkaline Phosphatase 139 (*)    GFR, Estimated 25 (*)    All other components within normal limits  CBC WITH DIFFERENTIAL/PLATELET - Abnormal; Notable for the following components:   Hemoglobin 10.6 (*)    HCT 35.1 (*)    All other components within normal limits  ACETAMINOPHEN LEVEL - Abnormal; Notable for the following components:   Acetaminophen (Tylenol), Serum <10 (*)    All other components within normal limits  SALICYLATE LEVEL - Abnormal; Notable for the following components:   Salicylate Lvl <3.8 (*)  All other components within normal limits  CK - Abnormal; Notable for the following components:   Total CK 711 (*)    All other components within normal limits  RESP PANEL BY RT-PCR (FLU A&B, COVID) ARPGX2  ETHANOL  RAPID URINE DRUG SCREEN, HOSP PERFORMED     EKG EKG Interpretation  Date/Time:  Thursday June 16 2021 12:41:49 EDT Ventricular Rate:  72 PR Interval:  162 QRS Duration: 86 QT Interval:  448 QTC Calculation: 490 R Axis:   18 Text Interpretation: Normal sinus rhythm Prolonged QT Abnormal ECG When compared with ECG of 05-May-2021 19:22, No significant change since last tracing Confirmed by Aletta Edouard [phone]) on 06/16/2021 12:44:33 PM  Radiology CT Head Wo Contrast  Result Date: 06/16/2021 CLINICAL DATA:  Head trauma.  Altered mental status. EXAM: CT HEAD WITHOUT CONTRAST TECHNIQUE: Contiguous axial images were obtained from the base of the skull through the vertex without intravenous contrast. RADIATION DOSE REDUCTION: This exam was performed according to the departmental dose-optimization program which includes automated exposure control, adjustment of the mA and/or kV according to patient size and/or use of iterative reconstruction technique. COMPARISON:  08/08/2020 FINDINGS: Brain: Periventricular white matter and corona radiata hypodensities favor chronic ischemic microvascular white matter disease. Partially empty sella. Otherwise, the brainstem, cerebellum, cerebral peduncles, thalamus, basal ganglia, basilar cisterns, and ventricular system appear within normal limits. No intracranial hemorrhage, mass lesion, or acute CVA. Vascular: There is atherosclerotic calcification of the cavernous carotid arteries bilaterally. Skull: Unremarkable Sinuses/Orbits: Small left mastoid effusion. Other: No supplemental non-categorized findings. IMPRESSION: 1. No acute intracranial findings. 2. Periventricular white matter and corona radiata hypodensities favor chronic ischemic microvascular white matter disease. 3. Atherosclerosis. 4. Small left mastoid effusion. Electronically Signed   By: Van Clines M.D.   On: 06/16/2021 13:52    Procedures Procedures   Medications Ordered in ED Medications  sodium chloride 0.9 % bolus 1,000 mL  (has no administration in time range)  potassium chloride SA (KLOR-CON M) CR tablet 40 mEq (has no administration in time range)  lactated ringers infusion (has no administration in time range)  oxyCODONE-acetaminophen (PERCOCET/ROXICET) 5-325 MG per tablet 1 tablet (1 tablet Oral Given 06/16/21 1316)    ED Course/ Medical Decision Making/ A&P Clinical Course as of 06/16/21 1456  Thu Jun 16, 2021  1415 Labs reveal AKI with creat jump from 1.4 to 2.16 over 8 days. CK mildly elevated, but not profound enough to suggest rhabdo. [GL]    Clinical Course User Index [GL] Sherre Poot Adora Fridge, PA-C                           Medical Decision Making Amount and/or Complexity of Data Reviewed Labs: ordered. Radiology: ordered.  Risk Prescription drug management. Decision regarding hospitalization.    MDM  This is a 61 y.o. female who presents to the ED with SI and evaluation for a fall last night  My Impression, Plan, and ED Course: Patient with normal vitals and no acute distress.  She presents with suicidal ideations and has an active plan.  She has a known history of this with multiple visits in the past. She had a Fall from her wheelchair last night where she hit her head.  She seems could have unreliable regarding this, so will obtain head CT but no neurological findings on exam. She also mentioned that she had recently laid on the ground for three days after a fall,  but I see she  was evaluated two days ago for this. Will add on a CK level. Medical Clearance labs ordered.    I personally ordered, reviewed, and interpreted all laboratory work and imaging and agree with radiologist interpretation. Results interpreted below:  CBC reveals stable anemia and no other acute findings The MP reveals mild hypokalemia to 3.3.  She also has a creatinine bump from 1.4-2.16 over the past 8 days.  BUN is normal.  Alk phos is mildly elevated. CK7 11 Acetaminophen, salicylate, ethanol levels are  undetectable COVID/flu negative CT head negative   Patient does have a mild AKI likely due to poor p.o. and dehydration.  She has a minimal CK elevation to 711.  Vitals have remained stable.  [name] not able to be medically cleared so will require admission for IV fluids.  She will need psychiatric evaluation for SI inpatient.  '@1450' , this case with Dr. Olevia Bowens with hospitalist who has agreed to accept patient for admission  Charting Requirements Additional history is obtained from:  Independent historian External Records from outside source obtained and reviewed including: Reviewed prior records including prior psychiatric notes, recent labs including creatinine value Social Determinants of Health:   mental health disorders Pertinant PMH that complicates patient's illness: Depression, Schizoaffective Disorder  Patient Care Problems that were addressed during this visit: - Suicidal Ideations: Acute illness with complication - AKI: Acute illness with complication Medications given in ED: IVF, Kcl PO Reevaluation of the patient after these medicines showed that the patient stayed the same I have reviewed home medications. Consultations: Discussed case with [facility] Disposition: Admit  This is a supervised visit with my attending physician, Dr. Melina Copa. We have discussed this patient and they have altered the plan as needed.  Portions of this note were generated with [employer]. Dictation errors may occur despite best attempts at proofreading.      Final Clinical Impression(s) / ED Diagnoses Final diagnoses:  Suicidal ideations  AKI (acute kidney injury) ([place])  Fall, initial encounter    Rx / DC Orders ED Discharge Orders     None         Adolphus Birchwood, PA-C 06/16/21 1456    Hayden Rasmussen, MD 06/16/21 1749

## 2021-06-16 NOTE — ED Triage Notes (Signed)
Pt BIB EMS from home c/o SI threatening to hurt herself with knife. Also c/o chronic leg pain.

## 2021-06-17 ENCOUNTER — Other Ambulatory Visit: Payer: Self-pay | Admitting: *Deleted

## 2021-06-17 DIAGNOSIS — E876 Hypokalemia: Secondary | ICD-10-CM | POA: Diagnosis present

## 2021-06-17 DIAGNOSIS — E860 Dehydration: Secondary | ICD-10-CM | POA: Diagnosis present

## 2021-06-17 DIAGNOSIS — J9611 Chronic respiratory failure with hypoxia: Secondary | ICD-10-CM | POA: Diagnosis present

## 2021-06-17 DIAGNOSIS — F32A Depression, unspecified: Secondary | ICD-10-CM | POA: Diagnosis not present

## 2021-06-17 DIAGNOSIS — M25462 Effusion, left knee: Secondary | ICD-10-CM | POA: Diagnosis not present

## 2021-06-17 DIAGNOSIS — J42 Unspecified chronic bronchitis: Secondary | ICD-10-CM

## 2021-06-17 DIAGNOSIS — Z20822 Contact with and (suspected) exposure to covid-19: Secondary | ICD-10-CM | POA: Diagnosis present

## 2021-06-17 DIAGNOSIS — L89622 Pressure ulcer of left heel, stage 2: Secondary | ICD-10-CM | POA: Diagnosis not present

## 2021-06-17 DIAGNOSIS — R45851 Suicidal ideations: Secondary | ICD-10-CM | POA: Diagnosis present

## 2021-06-17 DIAGNOSIS — F5081 Binge eating disorder: Secondary | ICD-10-CM | POA: Diagnosis present

## 2021-06-17 DIAGNOSIS — E441 Mild protein-calorie malnutrition: Secondary | ICD-10-CM | POA: Diagnosis present

## 2021-06-17 DIAGNOSIS — G4733 Obstructive sleep apnea (adult) (pediatric): Secondary | ICD-10-CM | POA: Diagnosis present

## 2021-06-17 DIAGNOSIS — M7989 Other specified soft tissue disorders: Secondary | ICD-10-CM | POA: Diagnosis not present

## 2021-06-17 DIAGNOSIS — W050XXA Fall from non-moving wheelchair, initial encounter: Secondary | ICD-10-CM | POA: Diagnosis present

## 2021-06-17 DIAGNOSIS — F4312 Post-traumatic stress disorder, chronic: Secondary | ICD-10-CM | POA: Diagnosis present

## 2021-06-17 DIAGNOSIS — I5032 Chronic diastolic (congestive) heart failure: Secondary | ICD-10-CM | POA: Diagnosis not present

## 2021-06-17 DIAGNOSIS — G931 Anoxic brain damage, not elsewhere classified: Secondary | ICD-10-CM | POA: Diagnosis present

## 2021-06-17 DIAGNOSIS — J439 Emphysema, unspecified: Secondary | ICD-10-CM | POA: Diagnosis present

## 2021-06-17 DIAGNOSIS — J9612 Chronic respiratory failure with hypercapnia: Secondary | ICD-10-CM | POA: Diagnosis present

## 2021-06-17 DIAGNOSIS — M1712 Unilateral primary osteoarthritis, left knee: Secondary | ICD-10-CM | POA: Diagnosis not present

## 2021-06-17 DIAGNOSIS — M6282 Rhabdomyolysis: Secondary | ICD-10-CM | POA: Diagnosis present

## 2021-06-17 DIAGNOSIS — N1832 Chronic kidney disease, stage 3b: Secondary | ICD-10-CM

## 2021-06-17 DIAGNOSIS — E669 Obesity, unspecified: Secondary | ICD-10-CM | POA: Diagnosis present

## 2021-06-17 DIAGNOSIS — F419 Anxiety disorder, unspecified: Secondary | ICD-10-CM | POA: Diagnosis not present

## 2021-06-17 DIAGNOSIS — R531 Weakness: Secondary | ICD-10-CM | POA: Diagnosis not present

## 2021-06-17 DIAGNOSIS — G894 Chronic pain syndrome: Secondary | ICD-10-CM | POA: Diagnosis present

## 2021-06-17 DIAGNOSIS — I10 Essential (primary) hypertension: Secondary | ICD-10-CM | POA: Diagnosis not present

## 2021-06-17 DIAGNOSIS — I6529 Occlusion and stenosis of unspecified carotid artery: Secondary | ICD-10-CM | POA: Diagnosis present

## 2021-06-17 DIAGNOSIS — W19XXXA Unspecified fall, initial encounter: Secondary | ICD-10-CM | POA: Diagnosis not present

## 2021-06-17 DIAGNOSIS — E1122 Type 2 diabetes mellitus with diabetic chronic kidney disease: Secondary | ICD-10-CM | POA: Diagnosis present

## 2021-06-17 DIAGNOSIS — M109 Gout, unspecified: Secondary | ICD-10-CM | POA: Diagnosis present

## 2021-06-17 DIAGNOSIS — Z7401 Bed confinement status: Secondary | ICD-10-CM | POA: Diagnosis not present

## 2021-06-17 DIAGNOSIS — I130 Hypertensive heart and chronic kidney disease with heart failure and stage 1 through stage 4 chronic kidney disease, or unspecified chronic kidney disease: Secondary | ICD-10-CM | POA: Diagnosis present

## 2021-06-17 DIAGNOSIS — D631 Anemia in chronic kidney disease: Secondary | ICD-10-CM | POA: Diagnosis present

## 2021-06-17 DIAGNOSIS — N179 Acute kidney failure, unspecified: Secondary | ICD-10-CM | POA: Diagnosis not present

## 2021-06-17 LAB — COMPREHENSIVE METABOLIC PANEL
ALT: 23 U/L (ref 0–44)
AST: 30 U/L (ref 15–41)
Albumin: 2.7 g/dL — ABNORMAL LOW (ref 3.5–5.0)
Alkaline Phosphatase: 121 U/L (ref 38–126)
Anion gap: 7 (ref 5–15)
BUN: 22 mg/dL (ref 8–23)
CO2: 28 mmol/L (ref 22–32)
Calcium: 8.6 mg/dL — ABNORMAL LOW (ref 8.9–10.3)
Chloride: 105 mmol/L (ref 98–111)
Creatinine, Ser: 1.95 mg/dL — ABNORMAL HIGH (ref 0.44–1.00)
GFR, Estimated: 29 mL/min — ABNORMAL LOW (ref >60)
Glucose, Bld: 104 mg/dL — ABNORMAL HIGH (ref 70–99)
Potassium: 3.5 mmol/L (ref 3.5–5.1)
Sodium: 140 mmol/L (ref 135–145)
Total Bilirubin: 0.3 mg/dL (ref 0.3–1.2)
Total Protein: 5.8 g/dL — ABNORMAL LOW (ref 6.5–8.1)

## 2021-06-17 LAB — CBC
HCT: 30.1 % — ABNORMAL LOW (ref 36.0–46.0)
Hemoglobin: 9.1 g/dL — ABNORMAL LOW (ref 12.0–15.0)
MCH: 27.6 pg (ref 26.0–34.0)
MCHC: 30.2 g/dL (ref 30.0–36.0)
MCV: 91.2 fL (ref 80.0–100.0)
Platelets: 276 10*3/uL (ref 150–400)
RBC: 3.3 MIL/uL — ABNORMAL LOW (ref 3.87–5.11)
RDW: 15.1 % (ref 11.5–15.5)
WBC: 6.2 10*3/uL (ref 4.0–10.5)
nRBC: 0 % (ref 0.0–0.2)

## 2021-06-17 LAB — GLUCOSE, CAPILLARY
Glucose-Capillary: 101 mg/dL — ABNORMAL HIGH (ref 70–99)
Glucose-Capillary: 122 mg/dL — ABNORMAL HIGH (ref 70–99)
Glucose-Capillary: 93 mg/dL (ref 70–99)

## 2021-06-17 LAB — RAPID URINE DRUG SCREEN, HOSP PERFORMED
Amphetamines: NOT DETECTED
Barbiturates: NOT DETECTED
Benzodiazepines: POSITIVE — AB
Cocaine: NOT DETECTED
Opiates: POSITIVE — AB
Tetrahydrocannabinol: NOT DETECTED

## 2021-06-17 LAB — CK: Total CK: 376 U/L — ABNORMAL HIGH (ref 38–234)

## 2021-06-17 LAB — HEMOGLOBIN A1C
Hgb A1c MFr Bld: 5.1 % (ref 4.8–5.6)
Mean Plasma Glucose: 99.67 mg/dL

## 2021-06-17 MED ORDER — METHOCARBAMOL 500 MG PO TABS
500.0000 mg | ORAL_TABLET | Freq: Three times a day (TID) | ORAL | Status: DC | PRN
  Administered 2021-06-17 – 2021-06-22 (×13): 500 mg via ORAL
  Filled 2021-06-17 (×13): qty 1

## 2021-06-17 MED ORDER — OXYBUTYNIN CHLORIDE 5 MG PO TABS
5.0000 mg | ORAL_TABLET | Freq: Two times a day (BID) | ORAL | Status: DC
  Administered 2021-06-17 – 2021-06-22 (×11): 5 mg via ORAL
  Filled 2021-06-17 (×11): qty 1

## 2021-06-17 MED ORDER — ATORVASTATIN CALCIUM 20 MG PO TABS
20.0000 mg | ORAL_TABLET | Freq: Every morning | ORAL | Status: DC
  Administered 2021-06-17 – 2021-06-22 (×6): 20 mg via ORAL
  Filled 2021-06-17 (×6): qty 1

## 2021-06-17 MED ORDER — CARVEDILOL 6.25 MG PO TABS
6.2500 mg | ORAL_TABLET | Freq: Two times a day (BID) | ORAL | Status: DC
  Administered 2021-06-17 – 2021-06-22 (×11): 6.25 mg via ORAL
  Filled 2021-06-17 (×11): qty 1

## 2021-06-17 MED ORDER — PRAZOSIN HCL 1 MG PO CAPS
2.0000 mg | ORAL_CAPSULE | Freq: Every day | ORAL | Status: DC
  Administered 2021-06-17 – 2021-06-21 (×5): 2 mg via ORAL
  Filled 2021-06-17 (×6): qty 2

## 2021-06-17 MED ORDER — DULOXETINE HCL 30 MG PO CPEP: 30.0000 mg | ORAL_CAPSULE | Freq: Every morning | ORAL | Status: DC

## 2021-06-17 MED ORDER — DICYCLOMINE HCL 10 MG PO CAPS
10.0000 mg | ORAL_CAPSULE | Freq: Three times a day (TID) | ORAL | Status: DC
  Administered 2021-06-17 – 2021-06-22 (×16): 10 mg via ORAL
  Filled 2021-06-17 (×16): qty 1

## 2021-06-17 MED ORDER — DICLOFENAC SODIUM 1 % EX GEL
4.0000 g | Freq: Four times a day (QID) | CUTANEOUS | Status: DC | PRN
  Administered 2021-06-20: 4 g via TOPICAL
  Filled 2021-06-17: qty 100

## 2021-06-17 MED ORDER — FEBUXOSTAT 40 MG PO TABS
40.0000 mg | ORAL_TABLET | Freq: Every morning | ORAL | Status: DC
  Administered 2021-06-17 – 2021-06-22 (×6): 40 mg via ORAL
  Filled 2021-06-17 (×6): qty 1

## 2021-06-17 MED ORDER — UMECLIDINIUM BROMIDE 62.5 MCG/ACT IN AEPB
1.0000 | INHALATION_SPRAY | Freq: Every day | RESPIRATORY_TRACT | Status: DC
  Administered 2021-06-17 – 2021-06-22 (×6): 1 via RESPIRATORY_TRACT
  Filled 2021-06-17: qty 7

## 2021-06-17 MED ORDER — ESCITALOPRAM OXALATE 20 MG PO TABS
20.0000 mg | ORAL_TABLET | Freq: Every morning | ORAL | Status: DC
  Administered 2021-06-17 – 2021-06-22 (×6): 20 mg via ORAL
  Filled 2021-06-17 (×6): qty 1

## 2021-06-17 MED ORDER — DULOXETINE HCL 30 MG PO CPEP
30.0000 mg | ORAL_CAPSULE | Freq: Every morning | ORAL | Status: DC
  Administered 2021-06-18 – 2021-06-22 (×5): 30 mg via ORAL
  Filled 2021-06-17 (×5): qty 1

## 2021-06-17 MED ORDER — MONTELUKAST SODIUM 10 MG PO TABS
10.0000 mg | ORAL_TABLET | Freq: Every day | ORAL | Status: DC
  Administered 2021-06-17 – 2021-06-21 (×5): 10 mg via ORAL
  Filled 2021-06-17 (×5): qty 1

## 2021-06-17 MED ORDER — POTASSIUM CHLORIDE 20 MEQ PO PACK
40.0000 meq | PACK | Freq: Two times a day (BID) | ORAL | Status: AC
  Administered 2021-06-17 (×2): 40 meq via ORAL
  Filled 2021-06-17 (×2): qty 2

## 2021-06-17 MED ORDER — LACTATED RINGERS IV SOLN: INTRAVENOUS | Status: AC

## 2021-06-17 MED ORDER — FLUTICASONE FUROATE-VILANTEROL 100-25 MCG/ACT IN AEPB
1.0000 | INHALATION_SPRAY | Freq: Every day | RESPIRATORY_TRACT | Status: DC
  Administered 2021-06-17 – 2021-06-22 (×6): 1 via RESPIRATORY_TRACT
  Filled 2021-06-17: qty 28

## 2021-06-17 NOTE — Plan of Care (Signed)
  Problem: Education: Goal: Ability to describe self-care measures that may prevent or decrease complications (Diabetes Survival Skills Education) will improve Outcome: Progressing Goal: Individualized Educational Video(s) Outcome: Progressing   Problem: Fluid Volume: Goal: Ability to maintain a balanced intake and output will improve Outcome: Progressing   Problem: Metabolic: Goal: Ability to maintain appropriate glucose levels will improve Outcome: Progressing   Problem: Nutritional: Goal: Maintenance of adequate nutrition will improve Outcome: Progressing Goal: Progress toward achieving an optimal weight will improve Outcome: Progressing   Problem: Skin Integrity: Goal: Risk for impaired skin integrity will decrease Outcome: Progressing   Problem: Tissue Perfusion: Goal: Adequacy of tissue perfusion will improve Outcome: Progressing   Pt c/o bilateral leg pain.  Continues to state that she wants to kill herself.  Sitter at bedside.

## 2021-06-17 NOTE — Progress Notes (Signed)
[facility] PROGRESS NOTE    Progress Note  Tiffany [name]  RJJ:[id number] DOB: 1960-05-01 DOA: 06/16/2021 PCP: [name], MD     Brief Narrative:   Tiffany [name] is an 61 y.o. female past medical history significant for anoxic brain injury, osteoarthritis asthma binge eating disorder, coronary artery disease, chronic diastolic heart failure, chronic kidney disease stage III chronic pain syndrome posttraumatic stress disorder, COPD not on oxygen, polysubstance abuse specially cocaine, diabetes mellitus type 2, history of NSTEMI schizoaffective disorder, history of nonhemorrhagic stroke comes into the ED for suicidal ideations because of chronic pain and living situation.  CT of the head showed periventricular white matter and corona radiata hypodensity favoring chronic ischemic microvascular white matter changes.   Assessment/Plan:   AKI (acute kidney injury) on chronic kidney disease stage IIIb: Baseline creatinine 1.6-1.4 on admission of 2.1. Torsemide and NSAIDs were held. Likely prerenal in the setting of above. She was on IV fluid hydration her creatinine is slowly improving. Continue IV fluids for an additional 24 hours. Consult physical therapy. Out of bed to chair.   Mild rhabdomyolysis Improved with fluid resuscitation.  Prolonged QTc: Try to keep potassium greater than 4 magnesium greater than 2. Likely due to hypokalemia.  Hypokalemia: It was repleted now resolved 3.5 try to keep above 4.  Dyslipidemia: Continue to hold statins.  Diabetes mellitus type 2 with peripheral neuropathy: Continue Lyrica. Glucose relatively well controlled in house, check an A1c. At home as per med rec she is on no oral hypoglycemic agents or insulin.  COPD not on oxygen: Currently stable, not on oxygen. Continue inhalers.  Chronic diastolic heart failure: Volume depleted continue beta-blockers. Hold diuretics.  Essential hypertension: Relatively well controlled  continue to hold torsemide. Continue Coreg.  Anxiety and depression: one-to-one observation consulted TOC She related related suicidal ideation if she goes back to the place she relates she has a plan to cut herself.  One of the main arteries in her hand and her thigh she has no plans to commit suicide. Consult psych.  For suicidal ideation. We will also need psych to help as she is on Valium Lexapro Cymbalta and Vyvanse  Normocytic anemia: Hemoglobin relatively stable.  Mild protein caloric malnutrition:   DVT prophylaxis: lovenox Family Communication:none Status is: Observation The patient will require care spanning > 2 midnights and should be moved to inpatient because: Acute kidney injury in the setting of rhabdomyolysis.    Code Status:     Code Status Orders  (From admission, onward)           Start     Ordered   06/16/21 1542  Full code  Continuous        06/16/21 1543           Code Status History     Date Active Date Inactive Code Status Order ID Comments User Context   05/20/2021 0414 06/03/2021 0053 Full Code [id number]  Kayleen Memos, DO ED   04/24/2021 1956 05/02/2021 1650 Full Code [id number]  Marcelyn Bruins, MD ED   03/29/2021 2013 04/07/2021 0450 Full Code [id number]  Orene Desanctis, DO Inpatient   01/13/2021 0123 01/14/2021 2252 Full Code [id number]  Howerter, Ethelda Chick, DO ED   01/08/2021 1537 01/08/2021 1836 Full Code [id number]  Isla Pence, MD ED   01/05/2021 2232 01/06/2021 2104 Full Code [id number]  Henderly, Britni A, PA-C ED   12/16/2020 2037 12/17/2020 1800 Full Code [id number]  Lacretia Leigh, MD ED   11/16/2020  [place] 11/18/2020 0034 Full Code [id number]  Loni Beckwith, PA-C ED   10/15/2020 1724 10/17/2020 1852 Full Code [id number]  Davonna Belling, MD ED   08/08/2020 1940 08/16/2020 2123 Full Code [id number]  Etta Quill, DO ED   07/23/2020 0323 07/28/2020 2006 Full Code [id number]  Shela Leff, MD ED   05/11/2020 1811 05/16/2020 2124 Full  Code [id number]  Lequita Halt, MD ED   02/18/2020 1641 02/26/2020 2237 Full Code [id number]  Julian Hy, DO ED   10/27/2019 0921 11/03/2019 0024 Full Code [id number]  Jonnie Finner, DO ED   02/03/2019 1731 02/04/2019 1655 Full Code [id number]  Milton Ferguson, MD ED   02/03/2019 1427 02/03/2019 1731 Full Code [id number]  Lennice Sites, DO ED   10/07/2018 0013 10/08/2018 1355 Full Code [id number]  Margarita Mail, PA-C ED   10/02/2018 0858 10/05/2018 1654 Full Code [id number]  Dalia Heading, PA-C ED   11/21/2017 2227 11/23/2017 2056 Full Code [id number]  Etheleen Nicks, MD ED   06/19/2017 1412 06/19/2017 2027 Full Code [id number]  Bensimhon, Shaune Pascal, MD Inpatient   01/06/2017 0417 01/07/2017 2117 Full Code [id number]  Caroline More, DO Inpatient   11/03/2016 1953 11/05/2016 2243 Full Code [id number]  Verner Mould, MD ED   10/18/2016 1838 10/20/2016 2043 Full Code [id number]  Bufford Lope, DO Inpatient   10/07/2016 0200 10/09/2016 2340 Full Code [id number]  Sela Hilding, MD ED   09/08/2016 0709 09/21/2016 1858 Full Code [id number]  Omar Person, NP ED   07/10/2016 0205 07/20/2016 2246 Full Code [id number]  Marjie Skiff, MD Inpatient   05/09/2016 1015 05/12/2016 1816 Full Code [id number]  Lorna Few, DO Inpatient   04/21/2016 1813 04/24/2016 1826 Full Code [id number]  Smiley Houseman, MD ED   04/10/2016 1632 04/15/2016 2043 Full Code [id number]  Sela Hilding, MD Inpatient   12/01/2015 2144 12/06/2015 1920 Full Code [id number]  Clovis Fredrickson, MD Inpatient   11/25/2015 0141 12/01/2015 2137 Full Code [id number]  Elberta Leatherwood, MD Inpatient   08/19/2015 2134 08/23/2015 1646 Full Code [id number]  [name], DO Inpatient   07/09/2015 1210 07/11/2015 1803 Full Code [id number]  Jeannett Senior, PA-C ED   06/22/2015 0549 06/25/2015 1852 Full Code [id number]  Juanito Doom, MD ED   06/22/2015 0233 06/22/2015 0549 Full Code [id number]  Rogue Bussing, MD ED   07/08/2014 0331  07/09/2014 1836 Full Code [id number]  Olin Hauser, DO Inpatient   01/04/2013 0527 01/06/2013 2102 Full Code [id number]  Leeanne Rio, MD Inpatient   04/24/2011 1742 04/27/2011 2119 Full Code [id number]  Long, Gilles Chiquito, RN Inpatient         IV Access:   Peripheral IV   Procedures and diagnostic studies:   CT Head Wo Contrast  Result Date: 06/16/2021 CLINICAL DATA:  Head trauma.  Altered mental status. EXAM: CT HEAD WITHOUT CONTRAST TECHNIQUE: Contiguous axial images were obtained from the base of the skull through the vertex without intravenous contrast. RADIATION DOSE REDUCTION: This exam was performed according to the departmental dose-optimization program which includes automated exposure control, adjustment of the mA and/or kV according to patient size and/or use of iterative reconstruction technique. COMPARISON:  08/08/2020 FINDINGS: Brain: Periventricular white matter and corona radiata hypodensities favor chronic ischemic microvascular white matter disease. Partially empty sella. Otherwise, the brainstem, cerebellum, cerebral peduncles, thalamus, basal ganglia, basilar cisterns, and ventricular system appear within normal limits. No intracranial hemorrhage, mass lesion, or acute CVA. Vascular: There is  atherosclerotic calcification of the cavernous carotid arteries bilaterally. Skull: Unremarkable Sinuses/Orbits: Small left mastoid effusion. Other: No supplemental non-categorized findings. IMPRESSION: 1. No acute intracranial findings. 2. Periventricular white matter and corona radiata hypodensities favor chronic ischemic microvascular white matter disease. 3. Atherosclerosis. 4. Small left mastoid effusion. Electronically Signed   By: Van Clines M.D.   On: 06/16/2021 13:52     Medical Consultants:   None.   Subjective:    Tiffany [name] relates she is not hungry if she goes back to her living home situation she has a plan to take her life  Objective:     Vitals:   06/16/21 1715 06/16/21 2100 06/17/21 0115 06/17/21 0515  BP: (!) 141/93 119/82 128/78 137/82  Pulse: 66 65 73 71  Resp: '16 16 16 16  '$ Temp: 98.2 F (36.8 C) 98.1 F (36.7 C) 98.5 F (36.9 C) 98.3 F (36.8 C)  TempSrc: Oral Oral Oral Oral  SpO2: 100% 93% 93% 92%  Weight:      Height:       SpO2: 92 %   Intake/Output Summary (Last 24 hours) at 06/17/2021 0751 Last data filed at 06/17/2021 0500 Gross per 24 hour  Intake 1943.33 ml  Output 700 ml  Net 1243.33 ml   Filed Weights   06/16/21 1114  Weight: 78 kg    Exam: General exam: In no acute distress. Respiratory system: Good air movement and clear to auscultation. Cardiovascular system: S1 & S2 heard, RRR. No JVD. Gastrointestinal system: Abdomen is nondistended, soft and nontender.  Extremities: No pedal edema. Skin: No rashes, lesions or ulcers Psychiatry: Judgement and insight appear normal. Mood & affect appropriate.    Data Reviewed:    Labs: Basic Metabolic Panel: Recent Labs  Lab 06/14/21 0009 06/16/21 1312 06/17/21 0538  NA 142 137 140  K 4.1 3.3* 3.5  CL 97* 99 105  CO2 33* 26 28  GLUCOSE 71 81 104*  BUN '21 21 22  '$ CREATININE 1.69* 2.16* 1.95*  CALCIUM 9.8 9.1 8.6*  MG  --  2.1  --    GFR Estimated Creatinine Clearance: 28.9 mL/min (A) (by C-G formula based on SCr of 1.95 mg/dL (H)). Liver Function Tests: Recent Labs  Lab 06/16/21 1312 06/17/21 0538  AST 40 30  ALT 26 23  ALKPHOS 139* 121  BILITOT 0.7 0.3  PROT 7.1 5.8*  ALBUMIN 3.4* 2.7*   No results for input(s): LIPASE, AMYLASE in the last 168 hours. No results for input(s): AMMONIA in the last 168 hours. Coagulation profile No results for input(s): INR, PROTIME in the last 168 hours. COVID-19 Labs  No results for input(s): DDIMER, FERRITIN, LDH, CRP in the last 72 hours.  Lab Results  Component Value Date   SARSCOV2NAA NEGATIVE 06/16/2021   SARSCOV2NAA NEGATIVE 05/03/2021   [place] NEGATIVE 04/24/2021    [place] NEGATIVE 01/13/2021    CBC: Recent Labs  Lab 06/14/21 0009 06/16/21 1312 06/17/21 0538  WBC 5.4 6.1 6.2  NEUTROABS 3.2 3.6  --   HGB 10.5* 10.6* 9.1*  HCT 34.1* 35.1* 30.1*  MCV 90.7 90.2 91.2  PLT 426* 309 276   Cardiac Enzymes: Recent Labs  Lab 06/16/21 1310 06/17/21 0538  CKTOTAL 711* 376*   BNP (last 3 results) Recent Labs    07/13/20 1310  PROBNP 201   CBG: Recent Labs  Lab 06/16/21 1847 06/16/21 2116 06/17/21 0737  GLUCAP 129* 84 101*   D-Dimer: No results for input(s): DDIMER in the last 72 hours.  Hgb A1c: No results for input(s): HGBA1C in the last 72 hours. Lipid Profile: No results for input(s): CHOL, HDL, LDLCALC, TRIG, CHOLHDL, LDLDIRECT in the last 72 hours. Thyroid function studies: No results for input(s): TSH, T4TOTAL, T3FREE, THYROIDAB in the last 72 hours.  Invalid input(s): FREET3 Anemia work up: No results for input(s): VITAMINB12, FOLATE, FERRITIN, TIBC, IRON, RETICCTPCT in the last 72 hours. Sepsis Labs: Recent Labs  Lab 06/14/21 0009 06/16/21 1312 06/17/21 0538  WBC 5.4 6.1 6.2   Microbiology Recent Results (from the past 240 hour(s))  Resp Panel by RT-PCR (Flu A&B, Covid) Anterior Nasal Swab     Status: None   Collection Time: 06/16/21  1:17 PM   Specimen: Anterior Nasal Swab  Result Value Ref Range Status   SARS Coronavirus 2 by RT PCR NEGATIVE NEGATIVE Final    Comment: (NOTE) SARS-CoV-2 target nucleic acids are NOT DETECTED.  The SARS-CoV-2 RNA is generally detectable in upper respiratory specimens during the acute phase of infection. The lowest concentration of SARS-CoV-2 viral copies this assay can detect is 138 copies/mL. A negative result does not preclude SARS-Cov-2 infection and should not be used as the sole basis for treatment or other patient management decisions. A negative result may occur with  improper specimen collection/handling, submission of specimen other than nasopharyngeal swab,  presence of viral mutation(s) within the areas targeted by this assay, and inadequate number of viral copies(<138 copies/mL). A negative result must be combined with clinical observations, patient history, and epidemiological information. The expected result is Negative.  Fact Sheet for Patients:  EntrepreneurPulse.com.au  Fact Sheet for Healthcare Providers:  IncredibleEmployment.be  This test is no t yet approved or cleared by the [place] FDA and  has been authorized for detection and/or diagnosis of SARS-CoV-2 by FDA under an Emergency Use Authorization (EUA). This EUA will remain  in effect (meaning this test can be used) for the duration of the COVID-19 declaration under Section 564(b)(1) of the Act, 21 U.S.C.section 360bbb-3(b)(1), unless the authorization is terminated  or revoked sooner.       Influenza A by PCR NEGATIVE NEGATIVE Final   Influenza B by PCR NEGATIVE NEGATIVE Final    Comment: (NOTE) The Xpert Xpress SARS-CoV-2/FLU/RSV plus assay is intended as an aid in the diagnosis of influenza from Nasopharyngeal swab specimens and should not be used as a sole basis for treatment. Nasal washings and aspirates are unacceptable for Xpert Xpress SARS-CoV-2/FLU/RSV testing.  Fact Sheet for Patients: EntrepreneurPulse.com.au  Fact Sheet for Healthcare Providers: IncredibleEmployment.be  This test is not yet approved or cleared by the [place] FDA and has been authorized for detection and/or diagnosis of SARS-CoV-2 by FDA under an Emergency Use Authorization (EUA). This EUA will remain in effect (meaning this test can be used) for the duration of the COVID-19 declaration under Section 564(b)(1) of the Act, 21 U.S.C. section 360bbb-3(b)(1), unless the authorization is terminated or revoked.  Performed at [facility], [place] [address] [place], [place] [address]       Medications:    pantoprazole  40 mg Oral Daily   potassium chloride SA  20 mEq Oral Daily   pregabalin  75 mg Oral BID   thiamine  100 mg Oral Daily   Continuous Infusions:  lactated ringers 125 mL/hr at 06/17/21 0118      LOS: 0 days   Charlynne Cousins  [facility]  06/17/2021, 7:51 AM

## 2021-06-17 NOTE — Consult Note (Signed)
[facility] Inpatient Consult  06/17/2021  Tiffany [name] [birth year] [id number]  Patient is currently active with [facility] CM) for chronic disease management services.  Patient has been engaged by a [facility] RN Tourist information centre manager.  Received message from [place] case manager stating that patient had called [facility] triage line yesterday requesting SNF placement.   Plan: Will continue to follow for progression and disposition.   Of note, [facility] Care Management services does not replace or interfere with any services that are arranged by inpatient case management or social work.   Netta Cedars, MSN, RN [facility] Liaison Toll free office [phone]

## 2021-06-17 NOTE — Progress Notes (Signed)
Initial Nutrition Assessment  DOCUMENTATION CODES:   Obesity unspecified  INTERVENTION:   -Liberalized diet to regular given poor PO intakes recently and pt upset by menu choices. Reports good blood sugar control. CBGs here have ranged between 84-129  -Multivitamin with minerals daily  NUTRITION DIAGNOSIS:   Inadequate oral intake related to social / environmental circumstances as evidenced by per patient/family report.  GOAL:   Patient will meet greater than or equal to 90% of their needs  MONITOR:   PO intake, Labs, Weight trends, I & O's, Skin  REASON FOR ASSESSMENT:   Malnutrition Screening Tool    ASSESSMENT:   61 y.o. female past medical history significant for anoxic brain injury, osteoarthritis asthma binge eating disorder, coronary artery disease, chronic diastolic heart failure, chronic kidney disease stage III chronic pain syndrome posttraumatic stress disorder, COPD not on oxygen, polysubstance abuse specially cocaine, diabetes mellitus type 2, history of NSTEMI schizoaffective disorder, history of nonhemorrhagic stroke comes into the ED for suicidal ideations because of chronic pain and living situation.  Patient in room, sitter at bedside given suicidal ideation. Pt able to provide history. Main concern is that she is limited in her diet. States she doesn't have diabetes anymore. H/o diabetes but it has been controlled, A1c of 5.1. Pt states she didn't eat this morning d/t her diet limitations. PO intake was documented as 50% in flowsheets. States she did not eat for 3 days at home given her poor living situation and lack of family support. Pt became upset and cried multiple times during visit. Provided active listening.  Pt concerned that her weight was up. States she was told her weight should be no higher than 120 lbs. Current weight: 171 lbs. Encouraged good PO intakes.  Noted h/o of binge eating in chart. Pt even admits to "eating her feelings". Will need to  monitor for these behaviors.   Medications: Bentyl, KLOR-CON, Thiamine, Lactated ringers  Labs reviewed: CBGs: 84-129 UDS+ benzos, opiates HgbA1c: 5.1   NUTRITION - FOCUSED PHYSICAL EXAM:  Deferred, pt upset intermittently during visit  Diet Order:   Diet Order             Diet regular Room service appropriate? Yes; Fluid consistency: Thin  Diet effective now                   EDUCATION NEEDS:   No education needs have been identified at this time  Skin:  Skin Assessment: Reviewed RN Assessment  Last BM:  PTA  Height:   Ht Readings from Last 1 Encounters:  06/16/21 '5\' 2"'$  (1.575 m)    Weight:   Wt Readings from Last 1 Encounters:  06/16/21 78 kg    BMI:  Body mass index is 31.45 kg/m.  Estimated Nutritional Needs:   Kcal:  1900-2100  Protein:  90-105g  Fluid:  2L/day   Clayton Bibles, MS, RD, LDN Inpatient Clinical Dietitian Contact information available via Amion

## 2021-06-17 NOTE — Evaluation (Addendum)
Physical Therapy Evaluation Patient Details Name: Tiffany [name] MRN: [id number] DOB: May 24, 1960 Today's Date: 06/17/2021  History of Present Illness  61 y.o. female past medical history significant for anoxic brain injury, osteoarthritis asthma binge eating disorder, coronary artery disease, chronic diastolic heart failure, chronic kidney disease stage III chronic pain syndrome posttraumatic stress disorder, COPD not on oxygen, polysubstance abuse specially cocaine, diabetes mellitus type 2, history of NSTEMI schizoaffective disorder, history of nonhemorrhagic stroke comes into the ED for suicidal ideations because of chronic pain and living situation. Dx of AKI, mild rhabdomyolysis.  Clinical Impression  Pt admitted with above diagnosis. Max assist for supine to sit and to transfer from bed to recliner with a sliding board. Pt reports multiple falls at home while attempting to get up to her WC. [name] not safe to DC home alone. ST-SNF recommended.  Pt currently with functional limitations due to the deficits listed below (see PT Problem List). Pt will benefit from skilled PT to increase their independence and safety with mobility to allow discharge to the venue listed below.          Recommendations for follow up therapy are one component of a multi-disciplinary discharge planning process, led by the attending physician.  Recommendations may be updated based on patient status, additional functional criteria and insurance authorization.  Follow Up Recommendations Skilled nursing-short term rehab (<3 hours/day)    Assistance Recommended at Discharge Frequent or constant Supervision/Assistance  Patient can return home with the following  Two people to help with walking and/or transfers;A lot of help with bathing/dressing/bathroom;Assist for transportation;Help with stairs or ramp for entrance    Equipment Recommendations None recommended by PT  Recommendations for Other Services        Functional Status Assessment Patient has had a recent decline in their functional status and demonstrates the ability to make significant improvements in function in a reasonable and predictable amount of time.     Precautions / Restrictions Precautions Precautions: Fall Precaution Comments: back precautions due to cauda equina Restrictions Weight Bearing Restrictions: No      Mobility  Bed Mobility         Supine to sit: Min assist     General bed mobility comments: min A to raise trunk    Transfers   Equipment used: Sliding board Transfers: Bed to chair/wheelchair/BSC            Lateral/Scoot Transfers: +2 safety/equipment, With slide board, Max assist General transfer comment: Pt performed slide board transfers, MAX A for placement and removal of slideboard. max A fo 2 for transfer and for maintaining slideboard in good position when transferring to drop arm recliner and assist from extra person to keep recliner and bed surface in contact.  cues for head-hip relationship and UE positioning to assist.    Ambulation/Gait                  Stairs            Wheelchair Mobility    Modified Rankin (Stroke Patients Only)       Balance Overall balance assessment: Needs assistance Sitting-balance support: Feet supported, Single extremity supported Sitting balance-Leahy Scale: Poor Sitting balance - Comments: leans R in sitting, props on RUE Postural control: Right lateral lean     Standing balance comment: unable                             Pertinent Vitals/Pain  Pain Assessment Faces Pain Scale: Hurts little more Pain Location: lower extremities with movement Pain Descriptors / Indicators: Grimacing Pain Intervention(s): Limited activity within patient's tolerance, Monitored during session    Home Living Family/patient expects to be discharged to:: Unsure Living Arrangements: Alone Available Help at Discharge: Personal care  attendant;Available PRN/intermittently Type of Home: Apartment Home Access: Level entry       Home Layout: One level Home Equipment: Rollator (4 wheels);BSC/3in1;Wheelchair - power      Prior Function Prior Level of Function : Needs assist             Mobility Comments: pt reports she has an aide 7 days/week but that when aide isn't there she's attempted to get into her WC and has had several falls. Pt uses WC for moblity.       Hand Dominance   Dominant Hand: Right    Extremity/Trunk Assessment        Lower Extremity Assessment Lower Extremity Assessment: RLE deficits/detail;LLE deficits/detail RLE Deficits / Details: knee ext 3/5, ankle 0/5 RLE Sensation: decreased light touch LLE Deficits / Details: knee ext 3/5, ankle 0/5 LLE Sensation: decreased light touch    Cervical / Trunk Assessment Cervical / Trunk Assessment: Kyphotic  Communication   Communication: No difficulties  Cognition Arousal/Alertness: Awake/alert Behavior During Therapy: WFL for tasks assessed/performed Overall Cognitive Status: Within Functional Limits for tasks assessed Area of Impairment: Following commands, Safety/judgement, Memory                       Following Commands: Follows one step commands with increased time Safety/Judgement: Decreased awareness of safety, Decreased awareness of deficits   Problem Solving: Slow processing, Decreased initiation, Requires verbal cues, Requires tactile cues, Difficulty sequencing          General Comments      Exercises     Assessment/Plan    PT Assessment Patient needs continued PT services  PT Problem List Decreased strength;Decreased range of motion;Decreased activity tolerance;Decreased balance;Decreased mobility;Decreased coordination;Decreased cognition;Decreased knowledge of use of DME;Decreased safety awareness;Pain       PT Treatment Interventions DME instruction;Functional mobility training;Therapeutic  activities;Therapeutic exercise;Balance training;Neuromuscular re-education;Cognitive remediation;Patient/family education;Wheelchair mobility training    PT Goals (Current goals can be found in the Care Plan section)  Acute Rehab PT Goals Patient Stated Goal: "I want to walk" PT Goal Formulation: With patient Time For Goal Achievement: 07/01/21 Potential to Achieve Goals: Fair    Frequency Min 2X/week     Co-evaluation               AM-PAC PT "6 Clicks" Mobility  Outcome Measure Help needed turning from your back to your side while in a flat bed without using bedrails?: A Lot Help needed moving from lying on your back to sitting on the side of a flat bed without using bedrails?: A Lot Help needed moving to and from a bed to a chair (including a wheelchair)?: Total Help needed standing up from a chair using your arms (e.g., wheelchair or bedside chair)?: Total Help needed to walk in hospital room?: Total Help needed climbing 3-5 steps with a railing? : Total 6 Click Score: 8    End of Session   Activity Tolerance: Patient tolerated treatment well Patient left: in chair;with nursing/sitter in room;with call bell/phone within reach Nurse Communication: Mobility status PT Visit Diagnosis: Muscle weakness (generalized) (M62.81);History of falling (Z91.81);Difficulty in walking, not elsewhere classified (R26.2);Other abnormalities of gait and mobility (R26.89) Pain - part  of body: Leg    Time: 5750-5183 PT Time Calculation (min) (ACUTE ONLY): 31 min   Charges:   PT Evaluation $PT Eval Moderate Complexity: 1 Mod PT Treatments $Therapeutic Activity: 8-22 mins        Blondell Reveal Kistler PT 06/17/2021  Acute Rehabilitation Services Pager [phone] Office [phone]

## 2021-06-17 NOTE — TOC Initial Note (Signed)
Transition of Care [facility]) - Initial/Assessment Note    Patient Details  Name: Tiffany [name] MRN: [id number] Date of Birth: [birth year]  Transition of [facility]) CM/SW Contact:    Lynnell Catalan, RN Phone Number: 06/17/2021, 11:07 AM  Clinical Narrative:          Spoke with DSS APS worker Alinda Money [phone] as he came to visit pt in the hospital. Pt has been followed by APS for some time. Per MD plan is for psych eval. Pt from home alone. Will await physical therapy evaluation to begin dc planning. Pt was denied by insurance for SNF placement on last admission 5/16. Pt refused to go to SNF under her Medicaid at that time due to her not wanting to give up her SSI check to the SNF. TOC will continue to follow.          Activities of Daily Living Home Assistive Devices/Equipment: Eyeglasses ADL Screening (condition at time of admission) Patient's cognitive ability adequate to safely complete daily activities?: Yes Is the patient deaf or have difficulty hearing?: No Does the patient have difficulty seeing, even when wearing glasses/contacts?: No Does the patient have difficulty concentrating, remembering, or making decisions?: Yes ("minimally") Patient able to express need for assistance with ADLs?: Yes Does the patient have difficulty dressing or bathing?: No Independently performs ADLs?: No Communication: Independent Dressing (OT): Needs assistance Is this a change from baseline?: Pre-admission baseline Grooming: Independent Is this a change from baseline?: Pre-admission baseline Feeding: Independent Bathing: Needs assistance Is this a change from baseline?: Pre-admission baseline Toileting: Needs assistance Is this a change from baseline?: Pre-admission baseline In/Out Bed: Needs assistance Is this a change from baseline?: Pre-admission baseline Walks in Home: Dependent Is this a change from baseline?: Pre-admission baseline Does the patient have difficulty walking or  climbing stairs?: Yes Weakness of Legs: Both Weakness of Arms/Hands: Both   Admission diagnosis:  AKI (acute kidney injury) ([place]) [N17.9] Suicidal ideations [R45.851] Fall, initial encounter [W19.XXXA] Patient Active Problem List   Diagnosis Date Noted   Rhabdomyolysis 06/16/2021   Normocytic anemia 06/16/2021   Anxiety and depression 05/25/2021   Physical deconditioning 05/25/2021   Cauda equina compression ([place]) 04/24/2021   Hematochezia    Hyperlipidemia 03/31/2021   GI bleed 03/30/2021   Acute GI bleeding 03/29/2021   Atypical chest pain 01/13/2021   GERD (gastroesophageal reflux disease)    Neuropathy 11/18/2020   Pain in both feet 11/18/2020   Localized edema 11/18/2020   Encounter for medication management 11/17/2020   Protein-calorie malnutrition, severe 05/15/2020   Malnutrition of moderate degree 66/44/0347   Periumbilical abdominal pain    Dysphagia    Nausea vomiting and diarrhea    Generalized abdominal pain    E. coli UTI 42/59/5638   Acute metabolic encephalopathy 75/64/3329   Schizophrenia ([place]) 02/20/2020   Schizophrenia, schizo-affective ([place]) 51/88/4166   Metabolic acidosis, normal anion gap (NAG) 02/20/2020   Altered mental status    Hypocalcemia    AKI (acute kidney injury) ([place]) 02/18/2020   Pelvic mass in female 01/02/2020   Genital herpes 11/25/2019   Stage 2 skin ulcer of sacral region ([place]) 10/28/2019   Acute renal failure superimposed on stage 3b chronic kidney disease ([place]) 10/27/2019   Family discord 02/04/2019   Aggressive behavior    PTSD (post-traumatic stress disorder) 05/27/2018   Schizoaffective disorder, bipolar type ([place]) 04/05/2018   Closed displaced fracture of fifth metacarpal bone 03/21/2018   Difficulty with speech 01/24/2018   Encephalopathy 11/21/2017  Drug abuse ([place]) 11/21/2017   Frequent falls 10/11/2017   Binge eating disorder    Dependence on continuous supplemental oxygen 05/14/2017   Gout 04/11/2017    Hypomagnesemia    Chronic kidney disease, stage 3b ([place]) 12/15/2016   Carotid artery stenosis    Hypokalemia    Osteoarthritis 10/26/2016   Anoxic brain injury ([place]) 09/08/2016   Overactive bladder 06/07/2016   QT prolongation    OSA and COPD overlap syndrome ([place])    Arthritis    Essential hypertension 03/22/2016   Cocaine use disorder, severe, in sustained remission ([place]) 12/17/2015   History of drug abuse in remission ([name]) 11/28/2015   Hyponatremia 11/25/2015   Chronic diastolic congestive heart failure (H[facility])    Chronic respiratory failure with hypoxia ([place]) 06/22/2015   Tobacco use disorder 07/22/2014   COPD (chronic obstructive pulmonary disease) ([place]) 07/08/2014   Seizure ([place]) 01/04/2013   Chronic pain syndrome 06/18/2012   Dyslipidemia 04/24/2011   Anemia 04/24/2011   Diabetic neuropathy ([place]) 04/24/2011   Morbid obesity ([place]) 10/18/2010   Type 2 diabetes mellitus ([place]) 10/18/2010   PCP:  [name], MD Pharmacy:   [facility] [place] [place] [address] [place] [address] Phone: [phone] Fax: [phone]     Social Determinants of Health (SDOH) Interventions    Readmission Risk Interventions    05/24/2021    6:19 PM 07/26/2020    2:21 PM 02/23/2020    1:16 PM  Readmission Risk Prevention Plan  Transportation Screening Complete Complete Complete  Medication Review (RN Care Manager) Referral to Pharmacy Complete Complete  PCP or Specialist appointment within 3-5 days of discharge Complete Complete Complete  HRI or Home Care Consult Complete Complete Complete  SW Recovery Care/Counseling Consult Complete Complete Complete  Palliative Care Screening  Not Applicable Complete  Skilled Nursing Facility Complete Complete Complete

## 2021-06-17 NOTE — Patient Outreach (Signed)
[facility]) Care Management  06/17/2021  Tiffany [name] [birth year] [id number]   Member readmitted to hospital for leg pain and suicidal ideation. When she was discharged a few weeks ago, it was recommended for her to go to SNF but she refused.  She called the triage line yesterday saying she was ready to be placed.  Hospital liaisons notified, request made for inpatient [facility] team to initiate placement.   [name], RN, MSN, [place] Manager [phone]

## 2021-06-17 NOTE — Consult Note (Signed)
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  Rate  Speech Volume:Normal  Handedness:Right   Mood and Affect  Mood:Irritable (in pain " i dont feel good")  Affect:Appropriate   Thought Process  Thought Processes:Coherent; Linear  Descriptions of Associations:Circumstantial  Orientation:Full (Time, Place and Person)  Thought Content:Logical  History of Schizophrenia/Schizoaffective disorder:Yes  Duration of Psychotic Symptoms:Greater than six months  Hallucinations:Hallucinations:  None  Ideas of Reference:None  Suicidal Thoughts:Suicidal Thoughts: No  Homicidal Thoughts:Homicidal Thoughts: No   Sensorium  Memory:Recent Fair; Remote Fair; Immediate Poor  Judgment:Fair  Insight:Fair   Executive Functions  Concentration:Fair  Attention Span:Fair  [place]   Psychomotor Activity  Psychomotor Activity:Psychomotor Activity: Normal   Assets  Assets:Communication Skills; Desire for Improvement; Housing; Social Support; Resilience   Sleep  Sleep:Sleep: Poor   Physical Exam: Physical Exam ROS Blood pressure (!) 149/89, pulse 72, temperature 97.9 F (36.6 C), temperature source Oral, resp. rate 18, height '5\' 2"'$  (1.575 m), weight 78 kg, SpO2 98 %. Body mass index is 31.45 kg/m.  Treatment Plan Summary: Plan    Tiffany [name] is a 61 y.o.  female with past psychiatric history of schizoaffective disorder, bipolar type major depressive disorder, PTSD, anoxic brain injury, and cauda equina syndrome who presents to the emergency department endorsing suicidal ideations secondary to pain.  Patient is able to vocalize her pain as a major contributing factor to her worsening suicidal ideations and worsening depressive symptoms at this time.  SHe does appear to get easily frustrated when assessing for suicidality, psychosocial stressors, and risks factors.  Although the patient does present with normal speech, mood and affect she continues to endorse suicidal ideations.  It appears patient's primary goal is to receive treatment for pain management. Suicidal ideations appear to be 2/2 to pain, she has been admitted for further workup.  Patient also minimizes falls, and states she comes in for leg pain only.  Will keep suicide precautions in place, should mood improve as expected patient will not require inpatient psychiatric hospitalization.    Schiozaffective disorder, bipolar type: Continue psychotropic medications at  this time.   Anxiety: -Recommend adding Valium as needed.  Patient is currently on lowest dose available.  Psycho pharmacology education provided on use of benzodiazepines and opiates.  Patient declines any additional education or medication adjustments at this time, despite risk for falls, safety, respiratory depression, and increased risk for overdose.  Suicidal ideations:  Endorses passive suicidal ideations 2/t pain. Recommend appropriate management of pain.  Nightmares: Continue Prazosin 2 mg at bedtime.  Binge eating disorder; stable -May hold Vyvanse, urine drug screen negative for methamphetamines on this admission.  UDS was positive on May 24.   Patient does not meet criteria for inpatient psychiatric hospitalization.  Psychiatry will continue to follow from a distance.  Current plan is for the patient to go to skilled nursing facility, psychiatry will be see on Monday  Disposition: No evidence of imminent risk to self or others at present.   Patient does not meet criteria for psychiatric inpatient admission. Supportive therapy provided about ongoing stressors.  Suella Broad, FNP 06/17/2021 2:59 PM

## 2021-06-18 DIAGNOSIS — N179 Acute kidney failure, unspecified: Secondary | ICD-10-CM | POA: Diagnosis not present

## 2021-06-18 LAB — GLUCOSE, CAPILLARY
Glucose-Capillary: 124 mg/dL — ABNORMAL HIGH (ref 70–99)
Glucose-Capillary: 79 mg/dL (ref 70–99)
Glucose-Capillary: 92 mg/dL (ref 70–99)
Glucose-Capillary: 107 mg/dL — ABNORMAL HIGH (ref 70–99)
Glucose-Capillary: 120 mg/dL — ABNORMAL HIGH (ref 70–99)

## 2021-06-18 LAB — BASIC METABOLIC PANEL
Anion gap: 5 (ref 5–15)
BUN: 25 mg/dL — ABNORMAL HIGH (ref 8–23)
CO2: 27 mmol/L (ref 22–32)
Calcium: 8.5 mg/dL — ABNORMAL LOW (ref 8.9–10.3)
Chloride: 104 mmol/L (ref 98–111)
Creatinine, Ser: 1.62 mg/dL — ABNORMAL HIGH (ref 0.44–1.00)
GFR, Estimated: 36 mL/min — ABNORMAL LOW (ref >60)
Glucose, Bld: 116 mg/dL — ABNORMAL HIGH (ref 70–99)
Potassium: 4 mmol/L (ref 3.5–5.1)
Sodium: 136 mmol/L (ref 135–145)

## 2021-06-18 MED ORDER — OXYCODONE HCL 5 MG PO TABS
2.5000 mg | ORAL_TABLET | Freq: Once | ORAL | Status: AC
  Administered 2021-06-18: 2.5 mg via ORAL
  Filled 2021-06-18: qty 1

## 2021-06-18 NOTE — Progress Notes (Signed)
[facility] PROGRESS NOTE    Progress Note  Tiffany [name]  YQM:[id number] DOB: 03/24/1960 DOA: 06/16/2021 PCP: [name], MD     Brief Narrative:   Tiffany [name] is an 61 y.o. female past medical history significant for anoxic brain injury, osteoarthritis asthma binge eating disorder, coronary artery disease, chronic diastolic heart failure, chronic kidney disease stage III chronic pain syndrome posttraumatic stress disorder, COPD not on oxygen, polysubstance abuse specially cocaine, diabetes mellitus type 2, history of NSTEMI schizoaffective disorder, history of nonhemorrhagic stroke comes into the ED for suicidal ideations because of chronic pain and living situation.  CT of the head showed periventricular white matter and corona radiata hypodensity favoring chronic ischemic microvascular white matter changes.   Assessment/Plan:   AKI (acute kidney injury) on chronic kidney disease stage IIIb: Baseline creatinine 1.6-1.4 on admission of 2.1. In the setting of torsemide and NSAID these were held, likely prerenal. She was continued IV fluids basic metabolic panels pending this morning. Physical therapy evaluated the patient recommended skilled nursing facility. Out of bed to chair continue to work with physical therapy.  Mild rhabdomyolysis Resolved with fluid resuscitation.  Prolonged QTc: Try to keep potassium greater than 4 magnesium greater than 2. Likely due to hypokalemia.  Hypokalemia: It was repleted now resolved 3.5 try to keep above 4.  Dyslipidemia: Continue to hold statins.  Diabetes mellitus type 2 with peripheral neuropathy: Continue Lyrica. A1c of 5.1, has required no insulin blood glucose well controlled.  COPD not on oxygen: Currently stable, not on oxygen. Continue inhalers.  Chronic diastolic heart failure: Volume depleted continue beta-blockers. Hold diuretics.  Essential hypertension: Relatively well controlled continue to hold  torsemide. Continue Coreg.  Anxiety and depression: Psych was consulted recommended Valium, she endorses passive suicidal ideation they recommended to continue current regimen discontinue Vanzee. Continue Lexapro and Cymbalta. Discontinue sitter.  Normocytic anemia: Hemoglobin relatively stable.  Mild protein caloric malnutrition:   DVT prophylaxis: lovenox Family Communication:none Status is: Observation The patient will require care spanning > 2 midnights and should be moved to inpatient because: Acute kidney injury in the setting of rhabdomyolysis.    Code Status:     Code Status Orders  (From admission, onward)           Start     Ordered   06/16/21 1542  Full code  Continuous        06/16/21 1543           Code Status History     Date Active Date Inactive Code Status Order ID Comments User Context   05/20/2021 0414 06/03/2021 0053 Full Code [id number]  Kayleen Memos, DO ED   04/24/2021 1956 05/02/2021 1650 Full Code [id number]  Marcelyn Bruins, MD ED   03/29/2021 2013 04/07/2021 0450 Full Code [id number]  Orene Desanctis, DO Inpatient   01/13/2021 0123 01/14/2021 2252 Full Code [id number]  Howerter, Ethelda Chick, DO ED   01/08/2021 1537 01/08/2021 1836 Full Code [id number]  Isla Pence, MD ED   01/05/2021 2232 01/06/2021 2104 Full Code [id number]  Henderly, Britni A, PA-C ED   12/16/2020 2037 12/17/2020 1800 Full Code [id number]  Lacretia Leigh, MD ED   11/16/2020 1748 11/18/2020 0034 Full Code [id number]  Loni Beckwith, PA-C ED   10/15/2020 1724 10/17/2020 1852 Full Code [id number]  Davonna Belling, MD ED   08/08/2020 1940 08/16/2020 2123 Full Code [id number]  Etta Quill, DO ED   07/23/2020 0323 07/28/2020 2006 Full Code [id number]  Rathore,  [name], MD ED   05/11/2020 1811 05/16/2020 2124 Full Code [id number]  Lequita Halt, MD ED   02/18/2020 1641 02/26/2020 2237 Full Code [id number]  Julian Hy, DO ED   10/27/2019 0921 11/03/2019 0024 Full Code [id number]  Jonnie Finner, DO ED   02/03/2019 1731 02/04/2019 1655 Full Code [id number]  Milton Ferguson, MD ED   02/03/2019 1427 02/03/2019 1731 Full Code [id number]  Lennice Sites, DO ED   10/07/2018 0013 10/08/2018 1355 Full Code [id number]  Margarita Mail, PA-C ED   10/02/2018 0858 10/05/2018 1654 Full Code [id number]  Dalia Heading, PA-C ED   11/21/2017 2227 11/23/2017 2056 Full Code [id number]  Etheleen Nicks, MD ED   06/19/2017 1412 06/19/2017 2027 Full Code [id number]  Bensimhon, Shaune Pascal, MD Inpatient   01/06/2017 0417 01/07/2017 2117 Full Code [id number]  Caroline More, DO Inpatient   11/03/2016 1953 11/05/2016 2243 Full Code [id number]  Verner Mould, MD ED   10/18/2016 1838 10/20/2016 2043 Full Code [id number]  Bufford Lope, DO Inpatient   10/07/2016 0200 10/09/2016 2340 Full Code [id number]  Sela Hilding, MD ED   09/08/2016 0709 09/21/2016 1858 Full Code [id number]  Omar Person, NP ED   07/10/2016 0205 07/20/2016 2246 Full Code [id number]  Marjie Skiff, MD Inpatient   05/09/2016 1015 05/12/2016 1816 Full Code [id number]  Lorna Few, DO Inpatient   04/21/2016 1813 04/24/2016 1826 Full Code [id number]  Smiley Houseman, MD ED   04/10/2016 1632 04/15/2016 2043 Full Code [id number]  Sela Hilding, MD Inpatient   12/01/2015 2144 12/06/2015 1920 Full Code [id number]  Clovis Fredrickson, MD Inpatient   11/25/2015 0141 12/01/2015 2137 Full Code [id number]  Elberta Leatherwood, MD Inpatient   08/19/2015 2134 08/23/2015 1646 Full Code [id number]  [name], DO Inpatient   07/09/2015 1210 07/11/2015 1803 Full Code [id number]  Jeannett Senior, PA-C ED   06/22/2015 0549 06/25/2015 1852 Full Code [id number]  Juanito Doom, MD ED   06/22/2015 0233 06/22/2015 0549 Full Code [id number]  Rogue Bussing, MD ED   07/08/2014 0331 07/09/2014 1836 Full Code [id number]  Olin Hauser, DO Inpatient   01/04/2013 0527 01/06/2013 2102 Full Code [id number]  Leeanne Rio, MD Inpatient   04/24/2011 1742  04/27/2011 2119 Full Code [id number]  Long, Gilles Chiquito, RN Inpatient         IV Access:   Peripheral IV   Procedures and diagnostic studies:   CT Head Wo Contrast  Result Date: 06/16/2021 CLINICAL DATA:  Head trauma.  Altered mental status. EXAM: CT HEAD WITHOUT CONTRAST TECHNIQUE: Contiguous axial images were obtained from the base of the skull through the vertex without intravenous contrast. RADIATION DOSE REDUCTION: This exam was performed according to the departmental dose-optimization program which includes automated exposure control, adjustment of the mA and/or kV according to patient size and/or use of iterative reconstruction technique. COMPARISON:  08/08/2020 FINDINGS: Brain: Periventricular white matter and corona radiata hypodensities favor chronic ischemic microvascular white matter disease. Partially empty sella. Otherwise, the brainstem, cerebellum, cerebral peduncles, thalamus, basal ganglia, basilar cisterns, and ventricular system appear within normal limits. No intracranial hemorrhage, mass lesion, or acute CVA. Vascular: There is atherosclerotic calcification of the cavernous carotid arteries bilaterally. Skull: Unremarkable Sinuses/Orbits: Small left mastoid effusion. Other: No supplemental non-categorized findings. IMPRESSION: 1. No acute intracranial findings. 2. Periventricular white matter and corona radiata hypodensities favor chronic ischemic microvascular white matter disease. 3. Atherosclerosis. 4. Small left mastoid effusion. Electronically Signed  By: Van Clines M.D.   On: 06/16/2021 13:52     Medical Consultants:   None.   Subjective:    Tiffany [name] no complaints this morning.  Objective:    Vitals:   06/17/21 1718 06/17/21 2052 06/18/21 0558 06/18/21 0759  BP: 114/70 114/75 (!) 170/95   Pulse: 75 76 69   Resp:  18 16   Temp:  98.9 F (37.2 C) 98.5 F (36.9 C)   TempSrc:  Oral Oral   SpO2:  92% 96% 97%  Weight:      Height:        SpO2: 97 %   Intake/Output Summary (Last 24 hours) at 06/18/2021 0932 Last data filed at 06/18/2021 0837 Gross per 24 hour  Intake 2278.25 ml  Output 3200 ml  Net -921.75 ml    Filed Weights   06/16/21 1114  Weight: 78 kg    Exam: General exam: In no acute distress. Respiratory system: Good air movement and clear to auscultation. Cardiovascular system: S1 & S2 heard, RRR. No JVD. Gastrointestinal system: Abdomen is nondistended, soft and nontender.  Extremities: No pedal edema. Skin: No rashes, lesions or ulcers Psychiatry: Judgement and insight appear normal. Mood & affect appropriate.   Data Reviewed:    Labs: Basic Metabolic Panel: Recent Labs  Lab 06/14/21 0009 06/16/21 1312 06/17/21 0538  NA 142 137 140  K 4.1 3.3* 3.5  CL 97* 99 105  CO2 33* 26 28  GLUCOSE 71 81 104*  BUN '21 21 22  '$ CREATININE 1.69* 2.16* 1.95*  CALCIUM 9.8 9.1 8.6*  MG  --  2.1  --     GFR Estimated Creatinine Clearance: 28.9 mL/min (A) (by C-G formula based on SCr of 1.95 mg/dL (H)). Liver Function Tests: Recent Labs  Lab 06/16/21 1312 06/17/21 0538  AST 40 30  ALT 26 23  ALKPHOS 139* 121  BILITOT 0.7 0.3  PROT 7.1 5.8*  ALBUMIN 3.4* 2.7*    No results for input(s): LIPASE, AMYLASE in the last 168 hours. No results for input(s): AMMONIA in the last 168 hours. Coagulation profile No results for input(s): INR, PROTIME in the last 168 hours. COVID-19 Labs  No results for input(s): DDIMER, FERRITIN, LDH, CRP in the last 72 hours.  Lab Results  Component Value Date   SARSCOV2NAA NEGATIVE 06/16/2021   SARSCOV2NAA NEGATIVE 05/03/2021   SARSCOV2NAA NEGATIVE 04/24/2021   [place] NEGATIVE 01/13/2021    CBC: Recent Labs  Lab 06/14/21 0009 06/16/21 1312 06/17/21 0538  WBC 5.4 6.1 6.2  NEUTROABS 3.2 3.6  --   HGB 10.5* 10.6* 9.1*  HCT 34.1* 35.1* 30.1*  MCV 90.7 90.2 91.2  PLT 426* 309 276    Cardiac Enzymes: Recent Labs  Lab 06/16/21 1310 06/17/21 0538   CKTOTAL 711* 376*    BNP (last 3 results) Recent Labs    07/13/20 1310  PROBNP 201    CBG: Recent Labs  Lab 06/17/21 0737 06/17/21 1143 06/17/21 1535 06/17/21 2059 06/18/21 0710  GLUCAP 101* 122* 93 124* 79    D-Dimer: No results for input(s): DDIMER in the last 72 hours. Hgb A1c: Recent Labs    06/17/21 0538  HGBA1C 5.1   Lipid Profile: No results for input(s): CHOL, HDL, LDLCALC, TRIG, CHOLHDL, LDLDIRECT in the last 72 hours. Thyroid function studies: No results for input(s): TSH, T4TOTAL, T3FREE, THYROIDAB in the last 72 hours.  Invalid input(s): FREET3 Anemia work up: No results for input(s): VITAMINB12, FOLATE, FERRITIN, TIBC, IRON, RETICCTPCT  in the last 72 hours. Sepsis Labs: Recent Labs  Lab 06/14/21 0009 06/16/21 1312 06/17/21 0538  WBC 5.4 6.1 6.2    Microbiology Recent Results (from the past 240 hour(s))  Resp Panel by RT-PCR (Flu A&B, Covid) Anterior Nasal Swab     Status: None   Collection Time: 06/16/21  1:17 PM   Specimen: Anterior Nasal Swab  Result Value Ref Range Status   SARS Coronavirus 2 by RT PCR NEGATIVE NEGATIVE Final    Comment: (NOTE) SARS-CoV-2 target nucleic acids are NOT DETECTED.  The SARS-CoV-2 RNA is generally detectable in upper respiratory specimens during the acute phase of infection. The lowest concentration of SARS-CoV-2 viral copies this assay can detect is 138 copies/mL. A negative result does not preclude SARS-Cov-2 infection and should not be used as the sole basis for treatment or other patient management decisions. A negative result may occur with  improper specimen collection/handling, submission of specimen other than nasopharyngeal swab, presence of viral mutation(s) within the areas targeted by this assay, and inadequate number of viral copies(<138 copies/mL). A negative result must be combined with clinical observations, patient history, and epidemiological information. The expected result is  Negative.  Fact Sheet for Patients:  EntrepreneurPulse.com.au  Fact Sheet for Healthcare Providers:  IncredibleEmployment.be  This test is no t yet approved or cleared by the [place] FDA and  has been authorized for detection and/or diagnosis of SARS-CoV-2 by FDA under an Emergency Use Authorization (EUA). This EUA will remain  in effect (meaning this test can be used) for the duration of the COVID-19 declaration under Section 564(b)(1) of the Act, 21 U.S.C.section 360bbb-3(b)(1), unless the authorization is terminated  or revoked sooner.       Influenza A by PCR NEGATIVE NEGATIVE Final   Influenza B by PCR NEGATIVE NEGATIVE Final    Comment: (NOTE) The Xpert Xpress SARS-CoV-2/FLU/RSV plus assay is intended as an aid in the diagnosis of influenza from Nasopharyngeal swab specimens and should not be used as a sole basis for treatment. Nasal washings and aspirates are unacceptable for Xpert Xpress SARS-CoV-2/FLU/RSV testing.  Fact Sheet for Patients: EntrepreneurPulse.com.au  Fact Sheet for Healthcare Providers: IncredibleEmployment.be  This test is not yet approved or cleared by the [place] FDA and has been authorized for detection and/or diagnosis of SARS-CoV-2 by FDA under an Emergency Use Authorization (EUA). This EUA will remain in effect (meaning this test can be used) for the duration of the COVID-19 declaration under Section 564(b)(1) of the Act, 21 U.S.C. section 360bbb-3(b)(1), unless the authorization is terminated or revoked.  Performed at [facility], [place] [address], [place], [place] [address]      Medications:    atorvastatin  20 mg Oral q morning   carvedilol  6.25 mg Oral BID WC   dicyclomine  10 mg Oral TID AC   DULoxetine  30 mg Oral q morning   escitalopram  20 mg Oral q morning   febuxostat  40 mg Oral q morning   fluticasone furoate-vilanterol   1 puff Inhalation Daily   And   umeclidinium bromide  1 puff Inhalation Daily   montelukast  10 mg Oral QHS   oxybutynin  5 mg Oral BID   pantoprazole  40 mg Oral Daily   potassium chloride SA  20 mEq Oral Daily   prazosin  2 mg Oral QHS   pregabalin  75 mg Oral BID   thiamine  100 mg Oral Daily   Continuous Infusions:  LOS: 1 day   Charlynne Cousins  [facility]  06/18/2021, 9:32 AM

## 2021-06-18 NOTE — NC FL2 (Signed)
[place] LEVEL OF CARE SCREENING TOOL     IDENTIFICATION  Patient Name: Tiffany [name] Birthdate: [birth year] Sex: female Admission Date (Current Location): 06/16/2021  [facility] and [place] Number:  Herbalist and Address:  [facility],  [address], [place]      Provider Number: [id number]  Attending Physician Name and Address:  Charlynne Cousins, MD  Relative Name and Phone Number:  Rondel Baton Daughter [phone]    Current Level of Care: Hospital Recommended Level of Care: [place] Prior Approval Number:    Date Approved/Denied:   PASRR Number: [id number]  Discharge Plan: SNF    Current Diagnoses: Patient Active Problem List   Diagnosis Date Noted   Rhabdomyolysis 06/16/2021   Normocytic anemia 06/16/2021   Anxiety and depression 05/25/2021   Physical deconditioning 05/25/2021   Cauda equina compression ([place]) 04/24/2021   Hematochezia    Hyperlipidemia 03/31/2021   GI bleed 03/30/2021   Acute GI bleeding 03/29/2021   Atypical chest pain 01/13/2021   GERD (gastroesophageal reflux disease)    Neuropathy 11/18/2020   Pain in both feet 11/18/2020   Localized edema 11/18/2020   Encounter for medication management 11/17/2020   Protein-calorie malnutrition, severe 05/15/2020   Malnutrition of moderate degree 24/46/9507   Periumbilical abdominal pain    Dysphagia    Nausea vomiting and diarrhea    Generalized abdominal pain    E. coli UTI 22/57/5051   Acute metabolic encephalopathy 83/35/8251   Schizophrenia ([place]) 02/20/2020   Schizophrenia, schizo-affective ([place]) 89/84/2103   Metabolic acidosis, normal anion gap (NAG) 02/20/2020   Altered mental status    Hypocalcemia    AKI (acute kidney injury) ([place]) 02/18/2020   Pelvic mass in female 01/02/2020   Genital herpes 11/25/2019   Stage 2 skin ulcer of sacral region ([place]) 10/28/2019   Acute renal failure superimposed on stage 3b chronic  kidney disease ([place]) 10/27/2019   Family discord 02/04/2019   Aggressive behavior    PTSD (post-traumatic stress disorder) 05/27/2018   Schizoaffective disorder, bipolar type ([place]) 04/05/2018   Closed displaced fracture of fifth metacarpal bone 03/21/2018   Difficulty with speech 01/24/2018   Encephalopathy 11/21/2017   Drug abuse ([place]) 11/21/2017   Frequent falls 10/11/2017   Binge eating disorder    Dependence on continuous supplemental oxygen 05/14/2017   Gout 04/11/2017   Hypomagnesemia    Chronic kidney disease, stage 3b ([place]) 12/15/2016   Carotid artery stenosis    Hypokalemia    Osteoarthritis 10/26/2016   Anoxic brain injury ([place]) 09/08/2016   Overactive bladder 06/07/2016   QT prolongation    OSA and COPD overlap syndrome ([place])    Arthritis    Essential hypertension 03/22/2016   Cocaine use disorder, severe, in sustained remission ([place]) 12/17/2015   History of drug abuse in remission ([place]) 11/28/2015   Hyponatremia 11/25/2015   Chronic diastolic congestive heart failure (HCC)    Chronic respiratory failure with hypoxia ([place]) 06/22/2015   Tobacco use disorder 07/22/2014   COPD (chronic obstructive pulmonary disease) ([place]) 07/08/2014   Seizure ([place]) 01/04/2013   Chronic pain syndrome 06/18/2012   Dyslipidemia 04/24/2011   Anemia 04/24/2011   Diabetic neuropathy ([place]) 04/24/2011   Morbid obesity ([place]) 10/18/2010   Type 2 diabetes mellitus ([place]) 10/18/2010    Orientation RESPIRATION BLADDER Height & Weight     Self, Time, Situation, Place  Normal Incontinent Weight: 78 kg Height:  '5\' 2"'$  (157.5 cm)  BEHAVIORAL SYMPTOMS/MOOD NEUROLOGICAL BOWEL  NUTRITION STATUS      Continent Diet (Regular)  AMBULATORY STATUS COMMUNICATION OF NEEDS Skin   Limited Assist Verbally Normal                       Personal Care Assistance Level of Assistance  Bathing, Feeding, Dressing Bathing Assistance: Limited assistance Feeding assistance: Independent Dressing Assistance:  Limited assistance     Functional Limitations Info  Sight, Hearing, Speech Sight Info: Adequate Hearing Info: Adequate Speech Info: Adequate    SPECIAL CARE FACTORS FREQUENCY  PT (By licensed PT), OT (By licensed OT)     PT Frequency: x5 week OT Frequency: x5 week            Contractures Contractures Info: Not present    Additional Factors Info  Code Status Code Status Info: FULL Allergies Info: Hydrocodone, Latuda (Lurasidone Hcl), Magnesium-containing Compounds, Prednisone, Tramadol, Codeine, Trazodone, Topamax (Topiramate), Sulfa Antibiotics, Tape. Psychotropic Info: escitalopram (LEXAPRO) tablet 20 mg  Dose: 20 mg  Freq: Every morning Route: PO,DULoxetine (CYMBALTA) DR capsule 30 mg  Dose: 30 mg  Freq: Every morning Route: PO         Current Medications (06/18/2021):  This is the current hospital active medication list Current Facility-Administered Medications  Medication Dose Route Frequency Provider Last Rate Last Admin   acetaminophen (TYLENOL) tablet 650 mg  650 mg Oral Q6H PRN Reubin Milan, MD   650 mg at 06/18/21 0013   Or   acetaminophen (TYLENOL) suppository 650 mg  650 mg Rectal Q6H PRN Reubin Milan, MD       albuterol (PROVENTIL) (2.5 MG/3ML) 0.083% nebulizer solution 2.5 mg  2.5 mg Nebulization Q6H PRN Reubin Milan, MD       atorvastatin (LIPITOR) tablet 20 mg  20 mg Oral q morning Charlynne Cousins, MD   20 mg at 06/18/21 1044   carvedilol (COREG) tablet 6.25 mg  6.25 mg Oral BID WC Charlynne Cousins, MD   6.25 mg at 06/18/21 8588   diazepam (VALIUM) tablet 5 mg  5 mg Oral QHS PRN Reubin Milan, MD   5 mg at 06/17/21 2127   diclofenac Sodium (VOLTAREN) 1 % topical gel 4 g  4 g Topical QID PRN Charlynne Cousins, MD       dicyclomine (BENTYL) capsule 10 mg  10 mg Oral TID [facility] Charlynne Cousins, MD   10 mg at 06/18/21 1346   DULoxetine (CYMBALTA) DR capsule 30 mg  30 mg Oral q morning Charlynne Cousins, MD   30 mg at  06/18/21 1044   escitalopram (LEXAPRO) tablet 20 mg  20 mg Oral q morning Charlynne Cousins, MD   20 mg at 06/18/21 1044   febuxostat (ULORIC) tablet 40 mg  40 mg Oral q morning Charlynne Cousins, MD   40 mg at 06/18/21 1045   fluticasone furoate-vilanterol (BREO ELLIPTA) 100-25 MCG/ACT 1 puff  1 puff Inhalation Daily Charlynne Cousins, MD   1 puff at 06/18/21 0758   And   umeclidinium bromide (INCRUSE ELLIPTA) 62.5 MCG/ACT 1 puff  1 puff Inhalation Daily Charlynne Cousins, MD   1 puff at 06/18/21 0758   methocarbamol (ROBAXIN) tablet 500 mg  500 mg Oral Q8H PRN Charlynne Cousins, MD   500 mg at 06/18/21 0814   montelukast (SINGULAIR) tablet 10 mg  10 mg Oral QHS Charlynne Cousins, MD   10 mg at 06/17/21 2127   oxybutynin (DITROPAN)  tablet 5 mg  5 mg Oral BID Charlynne Cousins, MD   5 mg at 06/18/21 1044   oxyCODONE (Oxy IR/ROXICODONE) immediate release tablet 5 mg  5 mg Oral Q6H PRN Reubin Milan, MD   5 mg at 06/18/21 0813   pantoprazole (PROTONIX) EC tablet 40 mg  40 mg Oral Daily Reubin Milan, MD   40 mg at 06/18/21 1045   potassium chloride SA (KLOR-CON M) CR tablet 20 mEq  20 mEq Oral Daily Reubin Milan, MD   20 mEq at 06/18/21 1044   prazosin (MINIPRESS) capsule 2 mg  2 mg Oral QHS Charlynne Cousins, MD   2 mg at 06/17/21 2127   pregabalin (LYRICA) capsule 75 mg  75 mg Oral BID Reubin Milan, MD   75 mg at 06/18/21 1044   prochlorperazine (COMPAZINE) injection 5 mg  5 mg Intravenous Q4H PRN Reubin Milan, MD       thiamine tablet 100 mg  100 mg Oral Daily Reubin Milan, MD   100 mg at 06/18/21 1044     Discharge Medications: Please see discharge summary for a list of discharge medications.  Relevant Imaging Results:  Relevant Lab Results:   Additional Information EZ#[id number]  Purcell Mouton, RN

## 2021-06-18 NOTE — TOC Progression Note (Signed)
Transition of Care [facility]) - Progression Note    Patient Details  Name: Tiffany [name] MRN: [id number] Date of Birth: Sep 19, 1960  Transition of Care [facility]) CM/SW Contact  Purcell Mouton, RN Phone Number: 06/18/2021, 2:59 PM  Clinical Narrative:    FL2 completed and faxed to SNF's.         Expected Discharge Plan and Services                                                 Social Determinants of Health (SDOH) Interventions    Readmission Risk Interventions    05/24/2021    6:19 PM 07/26/2020    2:21 PM 02/23/2020    1:16 PM  Readmission Risk Prevention Plan  Transportation Screening Complete Complete Complete  Medication Review (RN Care Manager) Referral to Pharmacy Complete Complete  PCP or Specialist appointment within 3-5 days of discharge Complete Complete Complete  HRI or Home Care Consult Complete Complete Complete  SW Recovery Care/Counseling Consult Complete Complete Complete  Palliative Care Screening  Not Applicable Complete  Skilled Nursing Facility Complete Complete Complete

## 2021-06-18 NOTE — Evaluation (Signed)
Occupational Therapy Evaluation Patient Details Name: Tiffany [name] MRN: [id number] DOB: 1960/03/16 Today's Date: 06/18/2021   History of Present Illness 61 y.o. female past medical history significant for anoxic brain injury, osteoarthritis asthma binge eating disorder, coronary artery disease, chronic diastolic heart failure, chronic kidney disease stage III chronic pain syndrome posttraumatic stress disorder, COPD not on oxygen, polysubstance abuse specially cocaine, diabetes mellitus type 2, history of NSTEMI schizoaffective disorder, history of nonhemorrhagic stroke comes into the ED for suicidal ideations because of chronic pain and living situation. Dx of AKI, mild rhabdomyolysis.   Clinical Impression   [name] is a 61 year old woman known to therapist who presents with complaints of pain, generalized weakness, and impaired balance. Patient discharged home on last admission despite recommendations for short term rehab. She lives in an apartment and now has an Clinical research associate. She says she has an aide but unsure of reliability of her history. [name] never forthcoming with details. She reports three falls at home. On evaluation she was able to to transfer herself slowly to the edge of the bed using bed rails. [name] leaning back and to the right - at times appears to fall back. She says she is positioning this way on purpose to reduce pain. She appears to have some decreased truncal strength. [name] min assist to stand from elevated bed height with steady and maintain standing position bearing down through elbows. In regards to ADLs she needs assistance for LB and setup for UB, [name] total for toileting. Patient will benefit from skilled OT services while in hospital to improve deficits and learn compensatory strategies as needed in order to improve functional abilities and reduce falls. Therapist does think patient has rehab potential to improve transfers and independence at  wheelchair level and short term rehab recommendation is preferred. If she refuses SNF - recommend Hospital Bed and maximize [facility] services. She reports she didn't get home health therapy on last discharge.       Recommendations for follow up therapy are one component of a multi-disciplinary discharge planning process, led by the attending physician.  Recommendations may be updated based on patient status, additional functional criteria and insurance authorization.   Follow Up Recommendations  Skilled nursing-short term rehab (<3 hours/day)    Assistance Recommended at Discharge Frequent or constant Supervision/Assistance  Patient can return home with the following Two people to help with walking and/or transfers;Direct supervision/assist for medications management;Direct supervision/assist for financial management;Assist for transportation;Help with stairs or ramp for entrance;Assistance with cooking/housework;A lot of help with bathing/dressing/bathroom    Functional Status Assessment  Patient has had a recent decline in their functional status and/or demonstrates limited ability to make significant improvements in function in a reasonable and predictable amount of time  Equipment Recommendations  Hospital bed (if she refuses SNF - needs motorized hospital bed.)    Recommendations for Other Services       Precautions / Restrictions Precautions Precautions: Fall Restrictions Weight Bearing Restrictions: No      Mobility Bed Mobility Overal bed mobility: Needs Assistance Bed Mobility: Supine to Sit     Supine to sit: Supervision     General bed mobility comments: Supervision to transfer to edge of bed with use of bed rails and increased time. Patient leaning back and to the right. She says she not falling back that she is offloading due to pain.    Transfers Overall transfer level: Needs assistance   Transfers: Sit to/from Stand  General transfer comment:  Patient min assist to rise with use of stedy bar from elevated surface and then transferred to recliner. Transfer via Lift Equipment: Stedy    Balance Overall balance assessment: Needs assistance Sitting-balance support: No upper extremity supported, Feet unsupported Sitting balance-Leahy Scale: Fair Sitting balance - Comments: leans R in sitting, props on RUE - she reports she is leaning to the right due to pain. Postural control: Right lateral lean   Standing balance-Leahy Scale: Zero                             ADL either performed or assessed with clinical judgement   ADL Overall ADL's : Needs assistance/impaired Eating/Feeding: Independent   Grooming: Set up;Sitting   Upper Body Bathing: Set up;Sitting   Lower Body Bathing: Maximal assistance;Bed level   Upper Body Dressing : Set up;Sitting   Lower Body Dressing: Total assistance;Bed level   Toilet Transfer: Total assistance Toilet Transfer Details (indicate cue type and reason): use of stedy for transfer [place] and Hygiene: Total assistance Toileting - Clothing Manipulation Details (indicate cue type and reason): Toal assist due to use of external catheter. Patient reports using diapers at home             Vision Baseline Vision/History: 1 Wears glasses       Perception     Praxis      Pertinent Vitals/Pain Pain Assessment Pain Assessment: Faces Faces Pain Scale: Hurts a little bit Pain Location: lower extremities with movement Pain Descriptors / Indicators: Grimacing Pain Intervention(s): Limited activity within patient's tolerance     Hand Dominance Right   Extremity/Trunk Assessment Upper Extremity Assessment Upper Extremity Assessment: RUE deficits/detail;LUE deficits/detail RUE Deficits / Details: WFL ROM, 4+/5 grip 4-/5 LUE Deficits / Details: WFL ROM, 4+/5 grip 4-/5   Lower Extremity Assessment Lower Extremity Assessment: Defer to PT evaluation    Cervical / Trunk Assessment Cervical / Trunk Assessment: Normal   Communication Communication Communication: No difficulties   Cognition Arousal/Alertness: Awake/alert Behavior During Therapy: WFL for tasks assessed/performed Overall Cognitive Status: Within Functional Limits for tasks assessed                                 General Comments: Patient has chronic persecutory delusions and paranoia     General Comments       Exercises     Shoulder Instructions      Home Living Family/patient expects to be discharged to:: Unsure Living Arrangements: Alone Available Help at Discharge: Personal care attendant;Available PRN/intermittently (she doesn't give many details on who she has to help her) Type of Home: Apartment Home Access: Level entry     Home Layout: One level     Bathroom Shower/Tub: Sponge bathes at baseline         Home Equipment: Rollator (4 wheels);BSC/3in1;Wheelchair - power          Prior Functioning/Environment Prior Level of Function : Needs assist             Mobility Comments: pt reports she has an aide 7 days/week but that when aide isn't there she's attempted to get into her WC and has had several falls. Pt uses WC for moblity. (unsure of accuracy of having an aide) ADLs Comments: Able to perform ADLs at bed level or seated level - needs assistance for LB ADLs  OT Problem List: Decreased strength;Decreased activity tolerance;Impaired balance (sitting and/or standing);Decreased cognition;Decreased safety awareness;Pain;Decreased knowledge of use of DME or AE;Decreased knowledge of precautions      OT Treatment/Interventions: Self-care/ADL training;Therapeutic exercise;Balance training;Patient/family education;Cognitive remediation/compensation;Therapeutic activities    OT Goals(Current goals can be found in the care plan section) Acute Rehab OT Goals Patient Stated Goal: transfer safely OT Goal Formulation: With  patient Time For Goal Achievement: 07/02/21 Potential to Achieve Goals: Fair  OT Frequency: Min 2X/week    Co-evaluation              AM-PAC OT "6 Clicks" Daily Activity     Outcome Measure Help from another person eating meals?: A Little Help from another person taking care of personal grooming?: A Little Help from another person toileting, which includes using toliet, bedpan, or urinal?: Total Help from another person bathing (including washing, rinsing, drying)?: A Lot Help from another person to put on and taking off regular upper body clothing?: A Little Help from another person to put on and taking off regular lower body clothing?: Total 6 Click Score: 13   End of Session Equipment Utilized During Treatment: Other (comment) (stedy) Nurse Communication: Mobility status  Activity Tolerance: Patient tolerated treatment well Patient left: in chair;with call bell/phone within reach  OT Visit Diagnosis: Other abnormalities of gait and mobility (R26.89);Muscle weakness (generalized) (M62.81)                Time: 1749-4496 OT Time Calculation (min): 26 min Charges:  OT General Charges $OT Visit: 1 Visit OT Evaluation $OT Eval Moderate Complexity: 1 Mod  [place] Apgar, OTR/L [place]  Office [phone] Pager: [place] 06/18/2021, 1:19 PM

## 2021-06-19 DIAGNOSIS — N179 Acute kidney failure, unspecified: Secondary | ICD-10-CM | POA: Diagnosis not present

## 2021-06-19 LAB — GLUCOSE, CAPILLARY
Glucose-Capillary: 91 mg/dL (ref 70–99)
Glucose-Capillary: 97 mg/dL (ref 70–99)
Glucose-Capillary: 85 mg/dL (ref 70–99)
Glucose-Capillary: 85 mg/dL (ref 70–99)

## 2021-06-19 NOTE — Progress Notes (Signed)
[facility] PROGRESS NOTE    Progress Note  Tiffany [name]  YIR:[id number] DOB: Nov 25, 1960 DOA: 06/16/2021 PCP: [name], MD     Brief Narrative:   Tiffany [name] is an 61 y.o. female past medical history significant for anoxic brain injury, osteoarthritis asthma binge eating disorder, coronary artery disease, chronic diastolic heart failure, chronic kidney disease stage III chronic pain syndrome posttraumatic stress disorder, COPD not on oxygen, polysubstance abuse specially cocaine, diabetes mellitus type 2, history of NSTEMI schizoaffective disorder, history of nonhemorrhagic stroke comes into the ED for suicidal ideations because of chronic pain and living situation.  CT of the head showed periventricular white matter and corona radiata hypodensity favoring chronic ischemic microvascular white matter changes.  Develop acute kidney injury in the setting of torsemide and NSAIDs these were held with started on IV fluid and his creatinine returned to baseline. Physical therapy evaluated the patient and recommended skilled nursing facility.   Assessment/Plan:   AKI (acute kidney injury) on chronic kidney disease stage IIIb: Baseline creatinine 1.6-1.4 on admission of 2.1. In the setting of torsemide and NSAID these were held, likely prerenal. Creatinine back to baseline. Awaiting skilled nursing facility placement.  Mild rhabdomyolysis Resolved with fluid resuscitation.  Prolonged QTc: Try to keep potassium greater than 4 magnesium greater than 2. Likely due to hypokalemia.  Hypokalemia: It was repleted now resolved 3.5 try to keep above 4.  Dyslipidemia: Continue to hold statins.  Diabetes mellitus type 2 with peripheral neuropathy: Continue Lyrica. A1c of 5.1, has required no insulin blood glucose well controlled.  COPD not on oxygen: Currently stable, not on oxygen. Continue inhalers.  Chronic diastolic heart failure: Volume depleted continue  beta-blockers. Hold diuretics.  Essential hypertension: Relatively well controlled continue to hold torsemide. Continue Coreg.  Anxiety and depression: Psych was consulted recommended Valium, she endorses passive suicidal ideation they recommended to continue current regimen discontinue Vanzee. Continue Lexapro and Cymbalta. Discontinue sitter.  Normocytic anemia: Hemoglobin relatively stable.  Mild protein caloric malnutrition:   DVT prophylaxis: lovenox Family Communication:none Status is: Observation The patient will require care spanning > 2 midnights and should be moved to inpatient because: Acute kidney injury in the setting of rhabdomyolysis.    Code Status:     Code Status Orders  (From admission, onward)           Start     Ordered   06/16/21 1542  Full code  Continuous        06/16/21 1543           Code Status History     Date Active Date Inactive Code Status Order ID Comments User Context   05/20/2021 0414 06/03/2021 0053 Full Code [id number]  Kayleen Memos, DO ED   04/24/2021 1956 05/02/2021 1650 Full Code [id number]  Marcelyn Bruins, MD ED   03/29/2021 2013 04/07/2021 0450 Full Code [id number]  Orene Desanctis, DO Inpatient   01/13/2021 0123 01/14/2021 2252 Full Code [id number]  Howerter, Ethelda Chick, DO ED   01/08/2021 1537 01/08/2021 1836 Full Code [id number]  Isla Pence, MD ED   01/05/2021 2232 01/06/2021 2104 Full Code [id number]  Henderly, Britni A, PA-C ED   12/16/2020 2037 12/17/2020 1800 Full Code [id number]  Lacretia Leigh, MD ED   11/16/2020 1748 11/18/2020 0034 Full Code [id number]  Loni Beckwith, PA-C ED   10/15/2020 1724 10/17/2020 1852 Full Code [id number]  Davonna Belling, MD ED   08/08/2020 1940 08/16/2020 2123 Full Code [id number]  Jennette Kettle  M, DO ED   07/23/2020 0323 07/28/2020 2006 Full Code [id number]  Shela Leff, MD ED   05/11/2020 1811 05/16/2020 2124 Full Code [id number]  Lequita Halt, MD ED   02/18/2020 1641 02/26/2020 2237 Full  Code [id number]  Julian Hy, DO ED   10/27/2019 0921 11/03/2019 0024 Full Code [id number]  Jonnie Finner, DO ED   02/03/2019 1731 02/04/2019 1655 Full Code [id number]  Milton Ferguson, MD ED   02/03/2019 1427 02/03/2019 1731 Full Code [id number]  Lennice Sites, DO ED   10/07/2018 0013 10/08/2018 1355 Full Code [id number]  Margarita Mail, PA-C ED   10/02/2018 0858 10/05/2018 1654 Full Code [id number]  Dalia Heading, PA-C ED   11/21/2017 2227 11/23/2017 2056 Full Code [id number]  Etheleen Nicks, MD ED   06/19/2017 1412 06/19/2017 2027 Full Code [id number]  Bensimhon, Shaune Pascal, MD Inpatient   01/06/2017 0417 01/07/2017 2117 Full Code [id number]  Caroline More, DO Inpatient   11/03/2016 1953 11/05/2016 2243 Full Code [id number]  Verner Mould, MD ED   10/18/2016 1838 10/20/2016 2043 Full Code [id number]  Bufford Lope, DO Inpatient   10/07/2016 0200 10/09/2016 2340 Full Code [id number]  Sela Hilding, MD ED   09/08/2016 0709 09/21/2016 1858 Full Code [id number]  Omar Person, NP ED   07/10/2016 0205 07/20/2016 2246 Full Code [id number]  Marjie Skiff, MD Inpatient   05/09/2016 1015 05/12/2016 1816 Full Code [id number]  Lorna Few, DO Inpatient   04/21/2016 1813 04/24/2016 1826 Full Code [id number]  Smiley Houseman, MD ED   04/10/2016 1632 04/15/2016 2043 Full Code [id number]  Sela Hilding, MD Inpatient   12/01/2015 2144 12/06/2015 1920 Full Code [id number]  Clovis Fredrickson, MD Inpatient   11/25/2015 0141 12/01/2015 2137 Full Code [id number]  Elberta Leatherwood, MD Inpatient   08/19/2015 2134 08/23/2015 1646 Full Code [id number]  [name], DO Inpatient   07/09/2015 1210 07/11/2015 1803 Full Code [id number]  Jeannett Senior, PA-C ED   06/22/2015 0549 06/25/2015 1852 Full Code [id number]  Juanito Doom, MD ED   06/22/2015 0233 06/22/2015 0549 Full Code [id number]  Rogue Bussing, MD ED   07/08/2014 0331 07/09/2014 1836 Full Code [id number]  Olin Hauser, DO Inpatient    01/04/2013 0527 01/06/2013 2102 Full Code [id number]  Leeanne Rio, MD Inpatient   04/24/2011 1742 04/27/2011 2119 Full Code [id number]  Long, Gilles Chiquito, RN Inpatient         IV Access:   Peripheral IV   Procedures and diagnostic studies:   No results found.   Medical Consultants:   None.   Subjective:    Tiffany [name] no complaints.  Objective:    Vitals:   06/18/21 1352 06/18/21 2038 06/19/21 0508 06/19/21 0757  BP: (!) 138/99 (!) 164/81 (!) 153/93   Pulse: 66 69 72   Resp: '18 14 14   '$ Temp: 98.2 F (36.8 C) 98.4 F (36.9 C) 98.6 F (37 C)   TempSrc: Oral Oral Oral   SpO2: 99% 99% 97% 97%  Weight:      Height:       SpO2: 97 %   Intake/Output Summary (Last 24 hours) at 06/19/2021 0854 Last data filed at 06/19/2021 0511 Gross per 24 hour  Intake 360 ml  Output 2800 ml  Net -2440 ml    Filed Weights   06/16/21 1114  Weight: 78 kg    Exam: General exam: In no acute distress. Respiratory system: Good air movement and  clear to auscultation. Cardiovascular system: S1 & S2 heard, RRR. No JVD. Gastrointestinal system: Abdomen is nondistended, soft and nontender.  Extremities: No pedal edema. Skin: No rashes, lesions or ulcers Psychiatry: Judgement and insight appear normal. Mood & affect appropriate.   Data Reviewed:    Labs: Basic Metabolic Panel: Recent Labs  Lab 06/14/21 0009 06/16/21 1312 06/17/21 0538 06/18/21 0825  NA 142 137 140 136  K 4.1 3.3* 3.5 4.0  CL 97* 99 105 104  CO2 33* '26 28 27  '$ GLUCOSE 71 81 104* 116*  BUN '21 21 22 '$ 25*  CREATININE 1.69* 2.16* 1.95* 1.62*  CALCIUM 9.8 9.1 8.6* 8.5*  MG  --  2.1  --   --     GFR Estimated Creatinine Clearance: 34.8 mL/min (A) (by C-G formula based on SCr of 1.62 mg/dL (H)). Liver Function Tests: Recent Labs  Lab 06/16/21 1312 06/17/21 0538  AST 40 30  ALT 26 23  ALKPHOS 139* 121  BILITOT 0.7 0.3  PROT 7.1 5.8*  ALBUMIN 3.4* 2.7*    No results for input(s): LIPASE,  AMYLASE in the last 168 hours. No results for input(s): AMMONIA in the last 168 hours. Coagulation profile No results for input(s): INR, PROTIME in the last 168 hours. COVID-19 Labs  No results for input(s): DDIMER, FERRITIN, LDH, CRP in the last 72 hours.  Lab Results  Component Value Date   SARSCOV2NAA NEGATIVE 06/16/2021   SARSCOV2NAA NEGATIVE 05/03/2021   SARSCOV2NAA NEGATIVE 04/24/2021   [place] NEGATIVE 01/13/2021    CBC: Recent Labs  Lab 06/14/21 0009 06/16/21 1312 06/17/21 0538  WBC 5.4 6.1 6.2  NEUTROABS 3.2 3.6  --   HGB 10.5* 10.6* 9.1*  HCT 34.1* 35.1* 30.1*  MCV 90.7 90.2 91.2  PLT 426* 309 276    Cardiac Enzymes: Recent Labs  Lab 06/16/21 1310 06/17/21 0538  CKTOTAL 711* 376*    BNP (last 3 results) Recent Labs    07/13/20 1310  PROBNP 201    CBG: Recent Labs  Lab 06/18/21 0710 06/18/21 1125 06/18/21 1620 06/18/21 2039 06/19/21 0757  GLUCAP 79 92 107* 120* 91    D-Dimer: No results for input(s): DDIMER in the last 72 hours. Hgb A1c: Recent Labs    06/17/21 0538  HGBA1C 5.1    Lipid Profile: No results for input(s): CHOL, HDL, LDLCALC, TRIG, CHOLHDL, LDLDIRECT in the last 72 hours. Thyroid function studies: No results for input(s): TSH, T4TOTAL, T3FREE, THYROIDAB in the last 72 hours.  Invalid input(s): FREET3 Anemia work up: No results for input(s): VITAMINB12, FOLATE, FERRITIN, TIBC, IRON, RETICCTPCT in the last 72 hours. Sepsis Labs: Recent Labs  Lab 06/14/21 0009 06/16/21 1312 06/17/21 0538  WBC 5.4 6.1 6.2    Microbiology Recent Results (from the past 240 hour(s))  Resp Panel by RT-PCR (Flu A&B, Covid) Anterior Nasal Swab     Status: None   Collection Time: 06/16/21  1:17 PM   Specimen: Anterior Nasal Swab  Result Value Ref Range Status   SARS Coronavirus 2 by RT PCR NEGATIVE NEGATIVE Final    Comment: (NOTE) SARS-CoV-2 target nucleic acids are NOT DETECTED.  The SARS-CoV-2 RNA is generally detectable in  upper respiratory specimens during the acute phase of infection. The lowest concentration of SARS-CoV-2 viral copies this assay can detect is 138 copies/mL. A negative result does not preclude SARS-Cov-2 infection and should not be used as the sole basis for treatment or other patient management decisions. A negative result may occur with  improper specimen collection/handling, submission of specimen other than nasopharyngeal swab, presence of viral mutation(s) within the areas targeted by this assay, and inadequate number of viral copies(<138 copies/mL). A negative result must be combined with clinical observations, patient history, and epidemiological information. The expected result is Negative.  Fact Sheet for Patients:  EntrepreneurPulse.com.au  Fact Sheet for Healthcare Providers:  IncredibleEmployment.be  This test is no t yet approved or cleared by the [place] FDA and  has been authorized for detection and/or diagnosis of SARS-CoV-2 by FDA under an Emergency Use Authorization (EUA). This EUA will remain  in effect (meaning this test can be used) for the duration of the COVID-19 declaration under Section 564(b)(1) of the Act, 21 U.S.C.section 360bbb-3(b)(1), unless the authorization is terminated  or revoked sooner.       Influenza A by PCR NEGATIVE NEGATIVE Final   Influenza B by PCR NEGATIVE NEGATIVE Final    Comment: (NOTE) The Xpert Xpress SARS-CoV-2/FLU/RSV plus assay is intended as an aid in the diagnosis of influenza from Nasopharyngeal swab specimens and should not be used as a sole basis for treatment. Nasal washings and aspirates are unacceptable for Xpert Xpress SARS-CoV-2/FLU/RSV testing.  Fact Sheet for Patients: EntrepreneurPulse.com.au  Fact Sheet for Healthcare Providers: IncredibleEmployment.be  This test is not yet approved or cleared by the [place] FDA and has been  authorized for detection and/or diagnosis of SARS-CoV-2 by FDA under an Emergency Use Authorization (EUA). This EUA will remain in effect (meaning this test can be used) for the duration of the COVID-19 declaration under Section 564(b)(1) of the Act, 21 U.S.C. section 360bbb-3(b)(1), unless the authorization is terminated or revoked.  Performed at [facility], [place] [address], [place], [place] [address]      Medications:    atorvastatin  20 mg Oral q morning   carvedilol  6.25 mg Oral BID WC   dicyclomine  10 mg Oral TID AC   DULoxetine  30 mg Oral q morning   escitalopram  20 mg Oral q morning   febuxostat  40 mg Oral q morning   fluticasone furoate-vilanterol  1 puff Inhalation Daily   And   umeclidinium bromide  1 puff Inhalation Daily   montelukast  10 mg Oral QHS   oxybutynin  5 mg Oral BID   pantoprazole  40 mg Oral Daily   potassium chloride SA  20 mEq Oral Daily   prazosin  2 mg Oral QHS   pregabalin  75 mg Oral BID   thiamine  100 mg Oral Daily   Continuous Infusions:      LOS: 2 days   Charlynne Cousins  [facility]  06/19/2021, 8:54 AM

## 2021-06-20 ENCOUNTER — Ambulatory Visit: Payer: Self-pay | Admitting: *Deleted

## 2021-06-20 DIAGNOSIS — N179 Acute kidney failure, unspecified: Secondary | ICD-10-CM | POA: Diagnosis not present

## 2021-06-20 LAB — GLUCOSE, CAPILLARY
Glucose-Capillary: 103 mg/dL — ABNORMAL HIGH (ref 70–99)
Glucose-Capillary: 125 mg/dL — ABNORMAL HIGH (ref 70–99)
Glucose-Capillary: 72 mg/dL (ref 70–99)
Glucose-Capillary: 116 mg/dL — ABNORMAL HIGH (ref 70–99)

## 2021-06-20 NOTE — Consult Note (Signed)
[facility] Consult   Reason for Consult: Suicidal ideation Referring Physician: Dr. Aileen Fass Patient Identification: Tiffany [name] MRN:  [id number] Principal Diagnosis: AKI (acute kidney injury) [facility]) Diagnosis:  Principal Problem:   AKI (acute kidney injury) ([place]) Active Problems:   Type 2 diabetes mellitus ([place])   Dyslipidemia   Diabetic neuropathy ([place])   COPD (chronic obstructive pulmonary disease) ([place])   Chronic diastolic congestive heart failure ([place])   Essential hypertension   QT prolongation   Hypokalemia   Chronic kidney disease, stage 3b ([place])   Anxiety and depression   Rhabdomyolysis   Normocytic anemia   Total Time spent with patient: 1 hour  Subjective:   Tiffany [name] is a 61 y.o. female patient admitted with .  HPI:  Tiffany [name] is an 61 y.o. female past medical history significant for anoxic brain injury, osteoarthritis asthma binge eating disorder, coronary artery disease, chronic diastolic heart failure, chronic kidney disease stage III chronic pain syndrome posttraumatic stress disorder, COPD not on oxygen, polysubstance abuse specially cocaine, diabetes mellitus type 2, history of NSTEMI schizoaffective disorder, history of nonhemorrhagic stroke comes into the ED for suicidal ideations because of chronic pain and living situation.  CT of the head showed periventricular white matter and corona radiata hypodensity favoring chronic ischemic microvascular white matter changes.  Patient is well known to behavioral health service line.  She has been followed by [facility] behavioral health outpatient.   Patients is seen and assessed by this nurse practitioner. [name] observed to be lying in bed and describes her mood as " not good". She states "it is a whole realm of things that makes my mood not good." Most of her answers to questions, are answered with another question at times she becomes argumentative. SHe talks briefly about "mood not being  good", when assessing clarification she states " while I just told you I was having mood swings.  "  Emphasis was placed on her mood not being good, not having mood swings.  When attempting to address her definition of mood swings, patient began to become increasingly irritable and argumentative.  She requests medication for her "mood swings".  Her medication regimen was reviewed to include Cymbalta, Lexapro, prazosin, Valium as needed.  Patient states " those medications seem to be working really well right now.  Those 2 together seem to be doing really good.  "  Reemphasized her response to medications being effective, and she replies " I told you my mood swings are not good, what can you give me?"  Discussed with patient multiple mood stabilizing medications that are available, however at this time it is felt she does not need additional medication and or mood stabilizer despite her intermittent episodes of mood irritability.  Of note patient has tried Topamax, Neurontin, Lithium. patient was not in agreement with that, and requests a different provider.  Patient does have a history of doctor shopping, complaining, and dictating her medical care to include prescriptions when ever possible.  Psycho pharmacology education is attempted again, however unsuccessful.  We did attempt to redirect patient and focus on disposition to include current referrals to skilled nursing facilities.  In which patient continues to be in agreements for, however she replies " they better not send me just anywhere.  It needs to be up to par.  I left the last 3 facilities and I will leave again if it is not up to par.  "  Patient would not provide any  additional details regarding previous skilled nursing facilities in which she left AMA, discussed with patient about increasing barriers and access to if she continues to leave [facility].    Patient seen and chart reviewed.  Patient initially was calm, and cooperative,  however did become increasingly irritable when advising no additional medications will be prescribed particularly mood stabilizers.  She continues to remain focused on medication concerns, and became more irritable during this evaluation particularly with regards to concerns regarding adding additional medication and impacts it will have on her ongoing medical comorbidities and psychiatric stability.  After chart review patient has tried multiple psychotropic medication, with no benefit.  It should be noted that during today's evaluation patient reported dissatisfaction with not being prescribed her medication of choosing, despite endorsing mood swings.  She did state she did not want to continue care with me as I was not giving her what she wanted.  Writer did complete psychiatric evaluation, all questions, comments, and concerns were addressed prior to exiting the room.  Patient is unable to define mood swings, and or identify symptoms consistent with a mood swing.  Patient does not appear to be manic, impulsive, grandiose, hyper sexual, agitated, and or aggressive.  She denies any current suicidal ideations, homicidal ideations, and or auditory or visual hallucinations.  She does not appear to be of imminent danger to herself and or others at this time.   Total Time spent with patient: 1 hour  Past Psychiatric History:  H/O Schizoaffective disorder, bipolar type, chronic PTSD, binge eating disorder.  Patient is currently prescribed Valium 5 mg p.o. 3 times daily, asenapine 10 mg p.o. twice daily, Lexapro 10 mg p.o. daily.  Was previously receiving services through [facility] outpatient, Dr. Montel Culver.  Recently started at [facility] counseling on January 04, 2021 has not seen psychiatric provider. H/O multiple psychiatric hospitalization due to drug use and mental illness.  In the past she had tried Abilify, Risperdal, Wellbutrin, Xanax, Klonopin, trazodone and BuSpar.  H/O paranoia,  delusion, anxiety, manic-like symptoms.  She used to take Geodon 180 mg but does reduced due to high QTC interval.  Risk to Self:  Yes Risk to Others:   Endorse Prior Inpatient Therapy:   Multiple, [facility] Prior Outpatient Therapy:   Currently receiving services at  [facility] outpatient   Past Medical History:  Past Medical History:  Diagnosis Date   Agitation 11/22/2017   Anoxic brain injury ([place]) 09/08/2016   C. Arrest due to respiratory failure and COPD exacerbation   Anxiety    Arthritis    "all over" (04/10/2016)   Asthma 10/18/2010   Binge eating disorder    Cardiac arrest ([place]) 09/08/2016   PEA   Carotid artery stenosis    1-39% bilateral by dopplers 11/2016   Chronic diastolic (congestive) heart failure (HCC)    Chronic kidney disease, stage 3 (HCC)    Chronic pain syndrome 06/18/2012   Chronic post-traumatic stress disorder (PTSD) 05/27/2018   Chronic respiratory failure with hypoxia and hypercapnia ([place]) 06/22/2015   TRILOGY Vent >AVAPA-ES., Vt target 200-400, Max P 30 , PS max 20 , PS min 6-10 , E Max 6, E Min 4, Rate Auto AVAPS Rate 2 (titrate for pt comfort) , bleed O2 at 5l/m continuous flow .    Closed displaced fracture of fifth metacarpal bone 03/21/2018   Cocaine use disorder, severe, in sustained remission ([place]) 96/29/5284   Complication of anesthesia    decreased bp, decreased heart rate  COPD (chronic obstructive pulmonary disease) ([place]) 07/08/2014   Depression    Diabetic neuropathy ([place]) 04/24/2011   Difficulty with speech 01/24/2018   Disorder of nervous system    Drug abuse ([place]) 11/21/2017   Dyslipidemia 04/24/2011   Elevated troponin 04/28/2012   Emphysema    Encephalopathy 11/21/2017   Essential hypertension 03/22/2016   Fibula fracture 07/10/2016   Frequent falls 10/11/2017   GERD (gastroesophageal reflux disease)    Gout 04/11/2017   Heart attack ([place]) 1980s   History of blood transfusion 1994   "couldn't stop bleeding from  my period"   History of drug abuse in remission ([place]) 11/28/2015   Quit in 2017   Hyperlipidemia LDL goal <70    Incontinence    Manic depression ([place])    Morbid obesity ([place]) 10/18/2010   Obstructive sleep apnea 10/18/2010   On home oxygen therapy    "6L; 24/7" (04/10/2016)   OSA on CPAP    "wear mask sometimes" (04/10/2016)   Paranoid ([place])    "sometimes; I'm on RX for it" (04/10/2016)   Prolonged Q-T interval on ECG    Rectal bleeding 12/31/2015   Schizoaffective disorder, bipolar type ([place]) 04/05/2018   Seasonal allergies    Seborrheic keratoses 12/31/2013   Seizures ([place])    "don't know what kind; last one was ~ 1 yr ago" (04/10/2016)   Sinus bradycardia    Stroke [facility]) 1980s   denies residual on 04/10/2016   Thrush 09/19/2013   Type 2 diabetes mellitus ([place]) 10/18/2010    Past Surgical History:  Procedure Laterality Date   CESAREAN SECTION  1997   COLONOSCOPY WITH PROPOFOL N/A 04/01/2021   Procedure: COLONOSCOPY WITH PROPOFOL;  Surgeon: Carol Ada, MD;  Location: [facility];  Service: Gastroenterology;  Laterality: N/A;  Rectal bleeding with drop in hemoglobin to 7.2 g/dL   HERNIA REPAIR     IR CHOLANGIOGRAM EXISTING TUBE  07/20/2016   IR PERC CHOLECYSTOSTOMY  05/10/2016   IR RADIOLOGIST EVAL & MGMT  06/08/2016   IR RADIOLOGIST EVAL & MGMT  06/29/2016   IR SINUS/FIST TUBE CHK-NON GI  07/12/2016   RIGHT/LEFT HEART CATH AND CORONARY ANGIOGRAPHY N/A 06/19/2017   Procedure: RIGHT/LEFT HEART CATH AND CORONARY ANGIOGRAPHY;  Surgeon: Jolaine Artist, MD;  Location: [facility];  Service: Cardiovascular;  Laterality: N/A;   TIBIA IM NAIL INSERTION Right 07/12/2016   Procedure: INTRAMEDULLARY (IM) NAIL RIGHT TIBIA;  Surgeon: Leandrew Koyanagi, MD;  Location: [facility];  Service: Orthopedics;  Laterality: Right;   UMBILICAL HERNIA REPAIR  ~ 1963   "that's why I don't have a belly button"   VAGINAL HYSTERECTOMY     Family History:  Family History  Problem Relation Age of Onset    Cancer Mother        lung   Depression Mother    Cancer Father        prostate   Depression Sister    Anxiety disorder Sister    Schizophrenia Sister    Bipolar disorder Sister    Depression Sister    Depression Brother    Heart failure Other        cousin   Family Psychiatric  History: denies Social History:  Social History   Substance and Sexual Activity  Alcohol Use No   Alcohol/week: 0.0 standard drinks     Social History   Substance and Sexual Activity  Drug Use Not Currently   Types: Cocaine   Comment: 04/10/2016 "last used cocaine  back in November 2017"    Social History   Socioeconomic History   Marital status: Widowed    Spouse name: Not on file   Number of children: 3   Years of education: Not on file   Highest education level: Not on file  Occupational History   Occupation: disabled    Comment: factory production  Tobacco Use   Smoking status: Former    Packs/day: 1.50    Years: 38.00    Pack years: 57.00    Types: Cigarettes    Start date: 03/13/1977    Quit date: 04/10/2016    Years since quitting: 5.1   Smokeless tobacco: Never  Vaping Use   Vaping Use: Never used  Substance and Sexual Activity   Alcohol use: No    Alcohol/week: 0.0 standard drinks   Drug use: Not Currently    Types: Cocaine    Comment: 04/10/2016 "last used cocaine back in November 2017"   Sexual activity: Not Currently    Birth control/protection: Surgical  Other Topics Concern   Not on file  Social History Narrative   Has 1 son, [name]   Lives with son and his boyfriend   Her house has ramps and handrails should she ever needs them.    Her mother lives down the street from her and is a good support person in addition to her son.   She drives herself, has private transportation.    Cocaine free since 02/24/16, smoke free since 04/10/16   Social Determinants of Health   Financial Resource Strain: Not on file  Food Insecurity: Not on file  Transportation Needs: Unmet  Transportation Needs   Lack of Transportation (Medical): Yes   Lack of Transportation (Non-Medical): Yes  Physical Activity: Not on file  Stress: Not on file  Social Connections: Not on file   Additional Social History:    Allergies:   Allergies  Allergen Reactions   Hydrocodone Shortness Of Breath   Latuda [Lurasidone Hcl] Anaphylaxis   Magnesium-Containing Compounds Anaphylaxis    Tolerated Ensure   Prednisone Anaphylaxis, Swelling and Other (See Comments)    Tongue swelling, lip swelling, throat swelling, per pt    Tramadol Anaphylaxis and Swelling   Codeine Nausea And Vomiting   Trazodone Other (See Comments)    paranoia   Topamax [Topiramate] Other (See Comments)    Increases paranoia   Sulfa Antibiotics Itching   Tape Rash    Labs:  Results for orders placed or performed during the hospital encounter of 06/16/21 (from the past 48 hour(s))  Glucose, capillary     Status: Abnormal   Collection Time: 06/18/21  4:20 PM  Result Value Ref Range   Glucose-Capillary 107 (H) 70 - 99 mg/dL    Comment: Glucose reference range applies only to samples taken after fasting for at least 8 hours.   Comment 1 Notify RN   Glucose, capillary     Status: Abnormal   Collection Time: 06/18/21  8:39 PM  Result Value Ref Range   Glucose-Capillary 120 (H) 70 - 99 mg/dL    Comment: Glucose reference range applies only to samples taken after fasting for at least 8 hours.  Glucose, capillary     Status: None   Collection Time: 06/19/21  7:57 AM  Result Value Ref Range   Glucose-Capillary 91 70 - 99 mg/dL    Comment: Glucose reference range applies only to samples taken after fasting for at least 8 hours.   Comment 1 Notify RN  Comment 2 Document in Chart   Glucose, capillary     Status: None   Collection Time: 06/19/21 12:59 PM  Result Value Ref Range   Glucose-Capillary 97 70 - 99 mg/dL    Comment: Glucose reference range applies only to samples taken after fasting for at least 8  hours.   Comment 1 Notify RN    Comment 2 Document in Chart   Glucose, capillary     Status: None   Collection Time: 06/19/21  5:14 PM  Result Value Ref Range   Glucose-Capillary 85 70 - 99 mg/dL    Comment: Glucose reference range applies only to samples taken after fasting for at least 8 hours.   Comment 1 Notify RN    Comment 2 Document in Chart   Glucose, capillary     Status: None   Collection Time: 06/19/21  8:48 PM  Result Value Ref Range   Glucose-Capillary 85 70 - 99 mg/dL    Comment: Glucose reference range applies only to samples taken after fasting for at least 8 hours.  Glucose, capillary     Status: Abnormal   Collection Time: 06/20/21  7:24 AM  Result Value Ref Range   Glucose-Capillary 103 (H) 70 - 99 mg/dL    Comment: Glucose reference range applies only to samples taken after fasting for at least 8 hours.  Glucose, capillary     Status: Abnormal   Collection Time: 06/20/21 12:40 PM  Result Value Ref Range   Glucose-Capillary 125 (H) 70 - 99 mg/dL    Comment: Glucose reference range applies only to samples taken after fasting for at least 8 hours.   *Note: Due to a large number of results and/or encounters for the requested time period, some results have not been displayed. A complete set of results can be found in Results Review.    Current Facility-Administered Medications  Medication Dose Route Frequency Provider Last Rate Last Admin   acetaminophen (TYLENOL) tablet 650 mg  650 mg Oral Q6H PRN Reubin Milan, MD   650 mg at 06/19/21 2033   Or   acetaminophen (TYLENOL) suppository 650 mg  650 mg Rectal Q6H PRN Reubin Milan, MD       albuterol (PROVENTIL) (2.5 MG/3ML) 0.083% nebulizer solution 2.5 mg  2.5 mg Nebulization Q6H PRN Reubin Milan, MD       atorvastatin (LIPITOR) tablet 20 mg  20 mg Oral q morning Charlynne Cousins, MD   20 mg at 06/20/21 1034   carvedilol (COREG) tablet 6.25 mg  6.25 mg Oral BID WC Charlynne Cousins, MD   6.25  mg at 06/20/21 0850   diazepam (VALIUM) tablet 5 mg  5 mg Oral QHS PRN Reubin Milan, MD   5 mg at 06/19/21 2033   diclofenac Sodium (VOLTAREN) 1 % topical gel 4 g  4 g Topical QID PRN Charlynne Cousins, MD   4 g at 06/20/21 0856   dicyclomine (BENTYL) capsule 10 mg  10 mg Oral TID [facility] Charlynne Cousins, MD   10 mg at 06/20/21 1213   DULoxetine (CYMBALTA) DR capsule 30 mg  30 mg Oral q morning Charlynne Cousins, MD   30 mg at 06/20/21 1034   escitalopram (LEXAPRO) tablet 20 mg  20 mg Oral q morning Charlynne Cousins, MD   20 mg at 06/20/21 1035   febuxostat (ULORIC) tablet 40 mg  40 mg Oral q morning Charlynne Cousins, MD   40 mg  at 06/20/21 1033   fluticasone furoate-vilanterol (BREO ELLIPTA) 100-25 MCG/ACT 1 puff  1 puff Inhalation Daily Charlynne Cousins, MD   1 puff at 06/20/21 0825   And   umeclidinium bromide (INCRUSE ELLIPTA) 62.5 MCG/ACT 1 puff  1 puff Inhalation Daily Charlynne Cousins, MD   1 puff at 06/20/21 0825   methocarbamol (ROBAXIN) tablet 500 mg  500 mg Oral Q8H PRN Charlynne Cousins, MD   500 mg at 06/20/21 0850   montelukast (SINGULAIR) tablet 10 mg  10 mg Oral QHS Charlynne Cousins, MD   10 mg at 06/19/21 2051   oxybutynin (DITROPAN) tablet 5 mg  5 mg Oral BID Charlynne Cousins, MD   5 mg at 06/20/21 1035   oxyCODONE (Oxy IR/ROXICODONE) immediate release tablet 5 mg  5 mg Oral Q6H PRN Reubin Milan, MD   5 mg at 06/20/21 0850   pantoprazole (PROTONIX) EC tablet 40 mg  40 mg Oral Daily Reubin Milan, MD   40 mg at 06/20/21 1033   potassium chloride SA (KLOR-CON M) CR tablet 20 mEq  20 mEq Oral Daily Reubin Milan, MD   20 mEq at 06/20/21 1034   prazosin (MINIPRESS) capsule 2 mg  2 mg Oral QHS Charlynne Cousins, MD   2 mg at 06/19/21 2052   pregabalin (LYRICA) capsule 75 mg  75 mg Oral BID Reubin Milan, MD   75 mg at 06/20/21 1034   prochlorperazine (COMPAZINE) injection 5 mg  5 mg Intravenous Q4H PRN Reubin Milan, MD       thiamine tablet 100 mg  100 mg Oral Daily Reubin Milan, MD   100 mg at 06/20/21 1034    Musculoskeletal: Strength & Muscle Tone:  [place]:  UTA Patient leans: N/A            Psychiatric Specialty Exam:  Presentation  General Appearance: Appropriate for Environment; Casual  Eye Contact:Good  Speech:Clear and Coherent; Normal Rate  Speech Volume:Normal  Handedness:Right   Mood and Affect  Mood:Depressed  Affect:Congruent; Blunt   Thought Process  Thought Processes:Coherent; Linear  Descriptions of Associations:Circumstantial  Orientation:Full (Time, Place and Person)  Thought Content:Illogical; Tangential; Paranoid Ideation  History of Schizophrenia/Schizoaffective disorder:Yes  Duration of Psychotic Symptoms:Greater than six months  Hallucinations:Hallucinations: None  Ideas of Reference:None  Suicidal Thoughts:Suicidal Thoughts: Yes, Passive  Homicidal Thoughts:Homicidal Thoughts: No   Sensorium  Memory:Immediate Fair; Recent Fair; Remote Fair  Judgment:Fair  Insight:Fair   Executive Functions  Concentration:Fair  Attention Span:Fair  [place]   Psychomotor Activity  Psychomotor Activity:Psychomotor Activity: Normal   Assets  Assets:Communication Skills; Desire for Improvement; Housing; Social Support; Resilience; Leisure Time   Sleep  Sleep:Sleep: Poor   Physical Exam: Physical Exam ROS Blood pressure (!) 143/103, pulse 74, temperature 98.4 F (36.9 C), temperature source Oral, resp. rate 16, height '5\' 2"'$  (1.575 m), weight 78 kg, SpO2 99 %. Body mass index is 31.45 kg/m.  Treatment Plan Summary: Plan    Tiffany [name] is a 61 y.o.  female with past psychiatric history of schizoaffective disorder, bipolar type major depressive disorder, PTSD, anoxic brain injury, and cauda equina syndrome who presents to the emergency department endorsing  suicidal ideations secondary to pain.  Patient is able to vocalize her pain as a major contributing factor to her worsening suicidal ideations and worsening depressive symptoms at this time.  SHe does appear to get easily  frustrated when assessing for suicidality, psychosocial stressors, and risks factors.  She currently denies any imminent risk to self and or others.  Schiozaffective disorder, bipolar type: Continue psychotropic medications at this time.   Anxiety: -Recommend adding Valium as needed.  Patient is currently on lowest dose available. Patient declines any additional education or medication adjustments at this time, despite risk for falls, safety, respiratory depression, and increased risk for overdose.  Fluctuate in mood: -Recommend follow-up with outpatient psychiatry for further medication management.  Patient is currently stable on current psychotropic medications.  Will not adjust and or change any additional medications at this time as for patient request.  Suicidal ideations:  Endorses passive suicidal ideations 2/t pain. Recommend appropriate management of pain.  Nightmares: Continue Prazosin 2 mg at bedtime.  Binge eating disorder; stable -May hold Vyvanse, urine drug screen negative for methamphetamines on this admission.  UDS was positive on May 24.   Patient does not meet criteria for inpatient psychiatric hospitalization.   Psychiatry to sign off at this time.  Disposition: No evidence of imminent risk to self or others at present.   Patient does not meet criteria for psychiatric inpatient admission. Supportive therapy provided about ongoing stressors.  Suella Broad, FNP 06/20/2021 2:17 PM

## 2021-06-20 NOTE — Progress Notes (Signed)
Physical Therapy Treatment Patient Details Name: Tiffany [name] MRN: [id number] DOB: 10-Nov-1960 Today's Date: 06/20/2021   History of Present Illness 61 y.o. female past medical history significant for anoxic brain injury, osteoarthritis asthma binge eating disorder, coronary artery disease, chronic diastolic heart failure, chronic kidney disease stage III chronic pain syndrome posttraumatic stress disorder, COPD not on oxygen, polysubstance abuse specially cocaine, diabetes mellitus type 2, history of NSTEMI schizoaffective disorder, history of nonhemorrhagic stroke comes into the ED for suicidal ideations because of chronic pain and living situation. Dx of AKI, mild rhabdomyolysis.    PT Comments    General Comments: appears AxO x 3 selective with which questions she will answer.  Required MAX encouragement to participate. Assisted OOB to recliner was difficult.  General bed mobility comments: with HOB elevated and use of rails, can partially able to long sit in bed.  Unable to move B LE, pt using B UE's to self guide.  Greatest difficulty was scooting to EOB.  Required Max Assist with use of pad to complete.  Very limited B LE strength. General transfer comment: pt required MAX Assist to place sliding board under L buttock as she presents with poor dynamic sitting balance.  Pt using B UE's to support self.  Severe right lean.  Poor self ability to maintain midline/upright.  Required + 2 Max Asisst to complete a safe slide from elevated bed to recliner on pt's LEFT.  HIGH FALL RISK due to impaired trunk stability.  HIGH FALL RISK forward (face first).  Required increased time and multiple pillows to position in recliner upright. Pt lived home alone and will need ST Rehab at SNF prior to safe return.   Recommendations for follow up therapy are one component of a multi-disciplinary discharge planning process, led by the attending physician.  Recommendations may be updated based on patient status,  additional functional criteria and insurance authorization.  Follow Up Recommendations  Skilled nursing-short term rehab (<3 hours/day)     Assistance Recommended at Discharge Frequent or constant Supervision/Assistance  Patient can return home with the following Two people to help with walking and/or transfers;A lot of help with bathing/dressing/bathroom;Assist for transportation;Help with stairs or ramp for entrance   Equipment Recommendations  None recommended by PT    Recommendations for Other Services       Precautions / Restrictions Precautions Precautions: Fall Precaution Comments: back precautions due to cauda equina Restrictions Weight Bearing Restrictions: No     Mobility  Bed Mobility Overal bed mobility: Needs Assistance Bed Mobility: Supine to Sit Rolling: Max assist, +2 for physical assistance, +2 for safety/equipment         General bed mobility comments: with HOB elevated and use of rails, can partially able to long sit in bed.  Unable to move B LE, pt using B UE's to self guide.  Greatest difficulty was scooting to EOB.  Required Max Assist with use of pad to complete.  Very limited B LE strength.    Transfers Overall transfer level: Needs assistance Equipment used: Sliding board              Lateral/Scoot Transfers: +2 safety/equipment, With slide board, Max assist General transfer comment: pt required MAX Assist to place sliding board under L buttock as she presents with poor dynamic sitting balance.  Pt using B UE's to support self.  Severe right lean.  Poor self ability to maintain midline/upright.  Required + 2 Max Asisst to complete a safe slide from elevated bed to  recliner on pt's LEFT.  HIGH FALL RISK due to impaired trunk stability.  HIGH FALL RISK forward (face first).  Required increased time and multiple pillows to position in recliner upright.    Ambulation/Gait               General Gait Details: non mab uses a power chair at  home per eval   Stairs             Wheelchair Mobility    Modified Rankin (Stroke Patients Only)       Balance                                            Cognition Arousal/Alertness: Awake/alert   Overall Cognitive Status: Within Functional Limits for tasks assessed                                 General Comments: appears AxO x 3 selective with which questions she will answer.  Required MAX encouragement to participate.        Exercises      General Comments        Pertinent Vitals/Pain Pain Assessment Pain Assessment: Faces Faces Pain Scale: Hurts even more Pain Location: lower extremities with movement Pain Descriptors / Indicators: Grimacing, Guarding Pain Intervention(s): Monitored during session, Premedicated before session, Repositioned    Home Living                          Prior Function            PT Goals (current goals can now be found in the care plan section) Progress towards PT goals: Progressing toward goals    Frequency    Min 2X/week      PT Plan Current plan remains appropriate    Co-evaluation              AM-PAC PT "6 Clicks" Mobility   Outcome Measure  Help needed turning from your back to your side while in a flat bed without using bedrails?: A Lot Help needed moving from lying on your back to sitting on the side of a flat bed without using bedrails?: A Lot Help needed moving to and from a bed to a chair (including a wheelchair)?: A Lot Help needed standing up from a chair using your arms (e.g., wheelchair or bedside chair)?: Total Help needed to walk in hospital room?: Total Help needed climbing 3-5 steps with a railing? : Total 6 Click Score: 9    End of Session Equipment Utilized During Treatment: Gait belt Activity Tolerance: Patient limited by fatigue Patient left: in chair;with nursing/sitter in room;with call bell/phone within reach Nurse Communication:  Mobility status PT Visit Diagnosis: Muscle weakness (generalized) (M62.81);History of falling (Z91.81);Difficulty in walking, not elsewhere classified (R26.2);Other abnormalities of gait and mobility (R26.89) Pain - part of body: Leg     Time: 3016-0109 PT Time Calculation (min) (ACUTE ONLY): 16 min  Charges:  $Therapeutic Activity: 23-37 mins                     {Ozetta Flatley  PTA Acute  Rehabilitation Services Pager      [phone] Office      [phone]

## 2021-06-20 NOTE — Progress Notes (Signed)
[facility] PROGRESS NOTE    Progress Note  Tiffany [name]  RDE:[id number] DOB: 1960/04/20 DOA: 06/16/2021 PCP: [name], MD     Brief Narrative:   Tiffany [name] is an 61 y.o. female past medical history significant for anoxic brain injury, osteoarthritis asthma binge eating disorder, coronary artery disease, chronic diastolic heart failure, chronic kidney disease stage III chronic pain syndrome posttraumatic stress disorder, COPD not on oxygen, polysubstance abuse specially cocaine, diabetes mellitus type 2, history of NSTEMI schizoaffective disorder, history of nonhemorrhagic stroke comes into the ED for suicidal ideations because of chronic pain and living situation.  CT of the head showed periventricular white matter and corona radiata hypodensity favoring chronic ischemic microvascular white matter changes.  Develop acute kidney injury in the setting of torsemide and NSAIDs these were held with started on IV fluid and his creatinine returned to baseline. Physical therapy evaluated the patient and recommended skilled nursing facility.   Assessment/Plan:   AKI (acute kidney injury) on chronic kidney disease stage IIIb: Baseline creatinine 1.6-1.4 on admission of 2.1. In the setting of torsemide and NSAID these were held, likely prerenal. Creatinine back to baseline. Awaiting skilled nursing facility placement.  Mild rhabdomyolysis Resolved with fluid resuscitation.  Prolonged QTc: Try to keep potassium greater than 4 magnesium greater than 2. Likely due to hypokalemia.  Hypokalemia: It was repleted now resolved 3.5 try to keep above 4.  Dyslipidemia: Continue to hold statins.  Diabetes mellitus type 2 with peripheral neuropathy: Continue Lyrica. A1c of 5.1, has required no insulin blood glucose well controlled.  COPD not on oxygen: Currently stable, not on oxygen. Continue inhalers.  Chronic diastolic heart failure: Volume depleted continue  beta-blockers. Hold diuretics.  Essential hypertension: Relatively well controlled continue to hold torsemide. Continue Coreg.  Anxiety and depression: Psych was consulted recommended Valium, she endorses passive suicidal ideation they recommended to continue current regimen discontinue Vanzee. Continue Lexapro and Cymbalta. Discontinue sitter.  Normocytic anemia: Hemoglobin relatively stable.  Mild protein caloric malnutrition:   DVT prophylaxis: lovenox Family Communication:none Status is: Observation The patient will require care spanning > 2 midnights and should be moved to inpatient because: Acute kidney injury in the setting of rhabdomyolysis.    Code Status:     Code Status Orders  (From admission, onward)           Start     Ordered   06/16/21 1542  Full code  Continuous        06/16/21 1543           Code Status History     Date Active Date Inactive Code Status Order ID Comments User Context   05/20/2021 0414 06/03/2021 0053 Full Code [id number]  Kayleen Memos, DO ED   04/24/2021 1956 05/02/2021 1650 Full Code [id number]  Marcelyn Bruins, MD ED   03/29/2021 2013 04/07/2021 0450 Full Code [id number]  Orene Desanctis, DO Inpatient   01/13/2021 0123 01/14/2021 2252 Full Code [id number]  Howerter, Ethelda Chick, DO ED   01/08/2021 1537 01/08/2021 1836 Full Code [id number]  Isla Pence, MD ED   01/05/2021 2232 01/06/2021 2104 Full Code [id number]  Henderly, Britni A, PA-C ED   12/16/2020 2037 12/17/2020 1800 Full Code [id number]  Lacretia Leigh, MD ED   11/16/2020 1748 11/18/2020 0034 Full Code [id number]  Loni Beckwith, PA-C ED   10/15/2020 1724 10/17/2020 1852 Full Code [id number]  Davonna Belling, MD ED   08/08/2020 1940 08/16/2020 2123 Full Code [id number]  Jennette Kettle  M, DO ED   07/23/2020 0323 07/28/2020 2006 Full Code [id number]  Shela Leff, MD ED   05/11/2020 1811 05/16/2020 2124 Full Code [id number]  Lequita Halt, MD ED   02/18/2020 1641 02/26/2020 2237 Full  Code [id number]  Julian Hy, DO ED   10/27/2019 0921 11/03/2019 0024 Full Code [id number]  Jonnie Finner, DO ED   02/03/2019 1731 02/04/2019 1655 Full Code [id number]  Milton Ferguson, MD ED   02/03/2019 1427 02/03/2019 1731 Full Code [id number]  Lennice Sites, DO ED   10/07/2018 0013 10/08/2018 1355 Full Code [id number]  Margarita Mail, PA-C ED   10/02/2018 0858 10/05/2018 1654 Full Code [id number]  Dalia Heading, PA-C ED   11/21/2017 2227 11/23/2017 2056 Full Code [id number]  Etheleen Nicks, MD ED   06/19/2017 1412 06/19/2017 2027 Full Code [id number]  Bensimhon, Shaune Pascal, MD Inpatient   01/06/2017 0417 01/07/2017 2117 Full Code [id number]  Caroline More, DO Inpatient   11/03/2016 1953 11/05/2016 2243 Full Code [id number]  Verner Mould, MD ED   10/18/2016 1838 10/20/2016 2043 Full Code [id number]  Bufford Lope, DO Inpatient   10/07/2016 0200 10/09/2016 2340 Full Code [id number]  Sela Hilding, MD ED   09/08/2016 0709 09/21/2016 1858 Full Code [id number]  Omar Person, NP ED   07/10/2016 0205 07/20/2016 2246 Full Code [id number]  Marjie Skiff, MD Inpatient   05/09/2016 1015 05/12/2016 1816 Full Code [id number]  Lorna Few, DO Inpatient   04/21/2016 1813 04/24/2016 1826 Full Code [id number]  Smiley Houseman, MD ED   04/10/2016 1632 04/15/2016 2043 Full Code [id number]  Sela Hilding, MD Inpatient   12/01/2015 2144 12/06/2015 1920 Full Code [id number]  Clovis Fredrickson, MD Inpatient   11/25/2015 0141 12/01/2015 2137 Full Code [id number]  Elberta Leatherwood, MD Inpatient   08/19/2015 2134 08/23/2015 1646 Full Code [id number]  [name], DO Inpatient   07/09/2015 1210 07/11/2015 1803 Full Code [id number]  Jeannett Senior, PA-C ED   06/22/2015 0549 06/25/2015 1852 Full Code [id number]  Juanito Doom, MD ED   06/22/2015 0233 06/22/2015 0549 Full Code [id number]  Rogue Bussing, MD ED   07/08/2014 0331 07/09/2014 1836 Full Code [id number]  Olin Hauser, DO Inpatient    01/04/2013 0527 01/06/2013 2102 Full Code [id number]  Leeanne Rio, MD Inpatient   04/24/2011 1742 04/27/2011 2119 Full Code [id number]  Long, Gilles Chiquito, RN Inpatient         IV Access:   Peripheral IV   Procedures and diagnostic studies:   No results found.   Medical Consultants:   None.   Subjective:    Tiffany [name] no complaints.  Objective:    Vitals:   06/19/21 1352 06/19/21 2046 06/20/21 0554 06/20/21 0825  BP: (!) 166/98 124/78 137/89   Pulse: 66 69 71   Resp: '17 14 14   '$ Temp: 98.8 F (37.1 C) 98.5 F (36.9 C) 98.8 F (37.1 C)   TempSrc: Oral Oral Oral   SpO2: 98% 97% 95% 95%  Weight:      Height:       SpO2: 95 %   Intake/Output Summary (Last 24 hours) at 06/20/2021 0837 Last data filed at 06/20/2021 0252 Gross per 24 hour  Intake 720 ml  Output 1600 ml  Net -880 ml    Filed Weights   06/16/21 1114  Weight: 78 kg    Exam: General exam: In no acute distress. Respiratory system: Good air movement and clear to  auscultation. Cardiovascular system: S1 & S2 heard, RRR. No JVD. Gastrointestinal system: Abdomen is nondistended, soft and nontender.  Extremities: No pedal edema. Skin: No rashes, lesions or ulcers Psychiatry: Judgement and insight appear normal. Mood & affect appropriate.   Data Reviewed:    Labs: Basic Metabolic Panel: Recent Labs  Lab 06/14/21 0009 06/16/21 1312 06/17/21 0538 06/18/21 0825  NA 142 137 140 136  K 4.1 3.3* 3.5 4.0  CL 97* 99 105 104  CO2 33* '26 28 27  '$ GLUCOSE 71 81 104* 116*  BUN '21 21 22 '$ 25*  CREATININE 1.69* 2.16* 1.95* 1.62*  CALCIUM 9.8 9.1 8.6* 8.5*  MG  --  2.1  --   --     GFR Estimated Creatinine Clearance: 34.8 mL/min (A) (by C-G formula based on SCr of 1.62 mg/dL (H)). Liver Function Tests: Recent Labs  Lab 06/16/21 1312 06/17/21 0538  AST 40 30  ALT 26 23  ALKPHOS 139* 121  BILITOT 0.7 0.3  PROT 7.1 5.8*  ALBUMIN 3.4* 2.7*    No results for input(s): LIPASE,  AMYLASE in the last 168 hours. No results for input(s): AMMONIA in the last 168 hours. Coagulation profile No results for input(s): INR, PROTIME in the last 168 hours. COVID-19 Labs  No results for input(s): DDIMER, FERRITIN, LDH, CRP in the last 72 hours.  Lab Results  Component Value Date   SARSCOV2NAA NEGATIVE 06/16/2021   SARSCOV2NAA NEGATIVE 05/03/2021   SARSCOV2NAA NEGATIVE 04/24/2021   [place] NEGATIVE 01/13/2021    CBC: Recent Labs  Lab 06/14/21 0009 06/16/21 1312 06/17/21 0538  WBC 5.4 6.1 6.2  NEUTROABS 3.2 3.6  --   HGB 10.5* 10.6* 9.1*  HCT 34.1* 35.1* 30.1*  MCV 90.7 90.2 91.2  PLT 426* 309 276    Cardiac Enzymes: Recent Labs  Lab 06/16/21 1310 06/17/21 0538  CKTOTAL 711* 376*    BNP (last 3 results) Recent Labs    07/13/20 1310  PROBNP 201    CBG: Recent Labs  Lab 06/19/21 0757 06/19/21 1259 06/19/21 1714 06/19/21 2048 06/20/21 0724  GLUCAP 91 97 85 85 103*    D-Dimer: No results for input(s): DDIMER in the last 72 hours. Hgb A1c: No results for input(s): HGBA1C in the last 72 hours.  Lipid Profile: No results for input(s): CHOL, HDL, LDLCALC, TRIG, CHOLHDL, LDLDIRECT in the last 72 hours. Thyroid function studies: No results for input(s): TSH, T4TOTAL, T3FREE, THYROIDAB in the last 72 hours.  Invalid input(s): FREET3 Anemia work up: No results for input(s): VITAMINB12, FOLATE, FERRITIN, TIBC, IRON, RETICCTPCT in the last 72 hours. Sepsis Labs: Recent Labs  Lab 06/14/21 0009 06/16/21 1312 06/17/21 0538  WBC 5.4 6.1 6.2    Microbiology Recent Results (from the past 240 hour(s))  Resp Panel by RT-PCR (Flu A&B, Covid) Anterior Nasal Swab     Status: None   Collection Time: 06/16/21  1:17 PM   Specimen: Anterior Nasal Swab  Result Value Ref Range Status   SARS Coronavirus 2 by RT PCR NEGATIVE NEGATIVE Final    Comment: (NOTE) SARS-CoV-2 target nucleic acids are NOT DETECTED.  The SARS-CoV-2 RNA is generally  detectable in upper respiratory specimens during the acute phase of infection. The lowest concentration of SARS-CoV-2 viral copies this assay can detect is 138 copies/mL. A negative result does not preclude SARS-Cov-2 infection and should not be used as the sole basis for treatment or other patient management decisions. A negative result may occur with  improper specimen collection/handling, submission  of specimen other than nasopharyngeal swab, presence of viral mutation(s) within the areas targeted by this assay, and inadequate number of viral copies(<138 copies/mL). A negative result must be combined with clinical observations, patient history, and epidemiological information. The expected result is Negative.  Fact Sheet for Patients:  EntrepreneurPulse.com.au  Fact Sheet for Healthcare Providers:  IncredibleEmployment.be  This test is no t yet approved or cleared by the [place] FDA and  has been authorized for detection and/or diagnosis of SARS-CoV-2 by FDA under an Emergency Use Authorization (EUA). This EUA will remain  in effect (meaning this test can be used) for the duration of the COVID-19 declaration under Section 564(b)(1) of the Act, 21 U.S.C.section 360bbb-3(b)(1), unless the authorization is terminated  or revoked sooner.       Influenza A by PCR NEGATIVE NEGATIVE Final   Influenza B by PCR NEGATIVE NEGATIVE Final    Comment: (NOTE) The Xpert Xpress SARS-CoV-2/FLU/RSV plus assay is intended as an aid in the diagnosis of influenza from Nasopharyngeal swab specimens and should not be used as a sole basis for treatment. Nasal washings and aspirates are unacceptable for Xpert Xpress SARS-CoV-2/FLU/RSV testing.  Fact Sheet for Patients: EntrepreneurPulse.com.au  Fact Sheet for Healthcare Providers: IncredibleEmployment.be  This test is not yet approved or cleared by the [place] FDA  and has been authorized for detection and/or diagnosis of SARS-CoV-2 by FDA under an Emergency Use Authorization (EUA). This EUA will remain in effect (meaning this test can be used) for the duration of the COVID-19 declaration under Section 564(b)(1) of the Act, 21 U.S.C. section 360bbb-3(b)(1), unless the authorization is terminated or revoked.  Performed at [facility], [place] [address], [place], [place] [address]      Medications:    atorvastatin  20 mg Oral q morning   carvedilol  6.25 mg Oral BID WC   dicyclomine  10 mg Oral TID AC   DULoxetine  30 mg Oral q morning   escitalopram  20 mg Oral q morning   febuxostat  40 mg Oral q morning   fluticasone furoate-vilanterol  1 puff Inhalation Daily   And   umeclidinium bromide  1 puff Inhalation Daily   montelukast  10 mg Oral QHS   oxybutynin  5 mg Oral BID   pantoprazole  40 mg Oral Daily   potassium chloride SA  20 mEq Oral Daily   prazosin  2 mg Oral QHS   pregabalin  75 mg Oral BID   thiamine  100 mg Oral Daily   Continuous Infusions:      LOS: 3 days   Charlynne Cousins  [facility]  06/20/2021, 8:37 AM

## 2021-06-21 ENCOUNTER — Other Ambulatory Visit: Payer: Self-pay | Admitting: *Deleted

## 2021-06-21 ENCOUNTER — Telehealth: Payer: Self-pay | Admitting: *Deleted

## 2021-06-21 ENCOUNTER — Ambulatory Visit: Payer: Self-pay | Admitting: *Deleted

## 2021-06-21 LAB — GLUCOSE, CAPILLARY
Glucose-Capillary: 98 mg/dL (ref 70–99)
Glucose-Capillary: 113 mg/dL — ABNORMAL HIGH (ref 70–99)
Glucose-Capillary: 88 mg/dL (ref 70–99)
Glucose-Capillary: 105 mg/dL — ABNORMAL HIGH (ref 70–99)

## 2021-06-21 MED ORDER — AMLODIPINE BESYLATE 10 MG PO TABS
10.0000 mg | ORAL_TABLET | Freq: Once | ORAL | Status: AC
  Administered 2021-06-21: 10 mg via ORAL
  Filled 2021-06-21: qty 1

## 2021-06-21 MED ORDER — POLYETHYLENE GLYCOL 3350 17 G PO PACK
17.0000 g | PACK | Freq: Two times a day (BID) | ORAL | Status: DC
Start: 2021-06-21 — End: 2021-06-23
  Administered 2021-06-21: 17 g via ORAL
  Filled 2021-06-21 (×3): qty 1

## 2021-06-21 MED ORDER — OXYCODONE HCL 5 MG PO TABS
5.0000 mg | ORAL_TABLET | ORAL | Status: DC | PRN
  Administered 2021-06-21 – 2021-06-22 (×5): 5 mg via ORAL
  Filled 2021-06-21 (×7): qty 1

## 2021-06-21 NOTE — Progress Notes (Signed)
    OVERNIGHT PROGRESS REPORT    Notified by RN for vital signs.  Discharge postponed d/t severe hypertension.    Gershon Cull MSNA MSN ACNPC-AG Acute Care Nurse Practitioner [place]

## 2021-06-21 NOTE — Patient Outreach (Signed)
  Care Management   Follow-Up Note   06/21/2021  Name: Tiffany [name] MRN: [id number] DOB: 1961/01/02  Referred By: [name], MD  Reason for Referral:  Chronic Care Management Needs in Patient with Chronic Pain Syndrome, Altered Mental Status, Morbid Obesity, Tobacco Use Disorder, Seizures, Schizophrenia, Schizoaffective Disorder, Bipolar Type, Post-Traumatic Stress Disorder, Aggressive Behavior, Anxiety, Depression, Binge Eating Disorder, and Family Discord.  An unsuccessful telephone outreach was attempted today. The patient was referred to the case management team for assistance with care management and care coordination. A HIPAA compliant message was left on voicemail, providing contact information, encouraging patient to return LCSW's call at her earliest convenience.  LCSW will make another telephone outreach call attempt within the next 5-7 business days, if a return call is not received from patient in the meantime.  Patient is currently hospitalized and being treated for leg pain and suicidal ideation.  Request placed with Transition of Care Team to initiate placement, per patient's request.  Follow-Up Plan:  06/27/2021 at 9:45 am  [name], BSW, MSW, [place]  Licensed Clinical Social Worker  [place]  Mailing [place] [address], [place], [place] [address] Physical Address-[address], [place], [place] [address] Toll Free Main # [phone] Fax # [phone] Cell # [phone] [email or web address]

## 2021-06-21 NOTE — Consult Note (Signed)
WOC Nurse Consult Note: Patient receiving care in [name]. Reason for Consult: left heel wound Wound type: partial thickness, consistent with a stage 2 PI to the left lateral heel Pressure Injury POA: No Measurement:1.5 cm x 0.5 cm x no depth Wound IWO:EHOZ  Drainage (amount, consistency, odor) none  Periwound: intact Dressing procedure/placement/frequency: Apply iodine from the swabsticks or swab pads from clean utility to the left lateral heel wound.  Allow to dry then cover with a regular bandaid. Perform each shift. Monitor the wound area(s) for worsening of condition such as: Signs/symptoms of infection,  Increase in size,  Development of or worsening of odor, Development of pain, or increased pain at the affected locations.  Notify the medical team if any of these develop.  Thank you for the consult.  Discussed plan of care with the patient and bedside nurse.  [place] nurse will not follow at this time.  Please re-consult the [place] team if needed.  Val Riles, RN, MSN, CWOCN, CNS-BC, pager [phone]

## 2021-06-21 NOTE — TOC Transition Note (Signed)
Transition of Care [facility]) - CM/SW Discharge Note   Patient Details  Name: EYLIN PONTARELLI MRN: [id number] Date of Birth: October 11, 1960  [facility] CM/SW Contact:  Lynnell Catalan, RN Phone Number: 06/21/2021, 2:19 PM   Clinical Narrative:    Spoke with pt at bedside at length for dc planning. Pt is A&O x4. Pt provided with the 1 SNF that offered a bed. Pt adamantly declines to go to SNF. She states that she is going home. Pt was active with [facility] for home health services. [facility] liaison contacted to resume services for HHPT/OT/RN/Aide/[name]. Pt is requesting ambulance transport home. Address confirmed with pt. [facility] called for transport. Psych has cleared her and signed off.  Called APS worker Alinda Money [phone] to alert of dc home today.        Readmission Risk Interventions    05/24/2021    6:19 PM 07/26/2020    2:21 PM 02/23/2020    1:16 PM  Readmission Risk Prevention Plan  Transportation Screening Complete Complete Complete  Medication Review ([place]) Referral to Pharmacy Complete Complete  PCP or Specialist appointment within 3-5 days of discharge Complete Complete Complete  HRI or Home Care Consult Complete Complete Complete  SW Recovery Care/Counseling Consult Complete Complete Complete  Palliative Care Screening  Not Applicable Complete  Skilled Nursing Facility Complete Complete Complete

## 2021-06-21 NOTE — Discharge Summary (Signed)
Physician Discharge Summary  Tiffany [name] EHM:[id number] DOB: 1960/05/20 DOA: 06/16/2021  PCP: [name], MD  Admit date: 06/16/2021 Discharge date: 06/21/2021  Admitted From: Home Disposition:  Home  Recommendations for Outpatient Follow-up:  Follow up with PCP in 1-2 weeks Please obtain BMP/CBC in one week   Home Health:Yes Equipment/Devices:None  Discharge Condition:Stable CODE STATUS:Full Diet recommendation: Heart Healthy   Brief/Interim Summary: 61 y.o. female past medical history significant for anoxic brain injury, osteoarthritis asthma binge eating disorder, coronary artery disease, chronic diastolic heart failure, chronic kidney disease stage III chronic pain syndrome posttraumatic stress disorder, COPD not on oxygen, polysubstance abuse specially cocaine, diabetes mellitus type 2, history of NSTEMI schizoaffective disorder, history of nonhemorrhagic stroke comes into the ED for suicidal ideations because of chronic pain and living situation.  CT of the head showed periventricular white matter and corona radiata hypodensity favoring chronic ischemic microvascular white matter changes.  Develop acute kidney injury in the setting of torsemide and NSAIDs these were held with started on IV fluid and his creatinine returned to baseline.  Discharge Diagnoses:  Principal Problem:   AKI (acute kidney injury) ([place]) Active Problems:   Dyslipidemia   Diabetic neuropathy (HCC)   Type 2 diabetes mellitus (HCC)   COPD (chronic obstructive pulmonary disease) (HCC)   Chronic diastolic congestive heart failure (HCC)   Essential hypertension   QT prolongation   Hypokalemia   Chronic kidney disease, stage 3b (HCC)   Anxiety and depression   Rhabdomyolysis   Normocytic anemia  Acute kidney injury on chronic kidney disease stage IIIb: With a baseline creatinine 1.6 on admission 2.2 in the setting of torsemide and NSAID use these were held she was fluid resuscitated plan. Physical  therapy evaluated the patient and recommended skilled nursing facility. -She adamantly refused to go to skilled nursing facility.  Mild rhabdomyolysis: Resolved with fluid resuscitation.  Prolonged QTc: Likely due to hypokalemia resolved. Potassium was repleted orally now resolved.  Hypokalemia: Repleted orally now resolved.  Dyslipidemia: Resume statins no changes made.  Diabetes mellitus type 2 with peripheral neuropathy: No changes made to her medication A1c was 5.1 continue Lyrica.  COPD not on oxygen: She has 2 doses of albuterol one of them was DC'd no other changes were made to her inhalers she will continue them as an outpatient.  Chronic diastolic heart failure: She was volume depleted on admission torsemide held, beta-blocker continued she will resume torsemide as an outpatient.  Essential hypertension: Relatively well controlled no changes made to her medication.  Anxiety and depression/suicidal ideation: Psych was consulted who deemed her not a candidate for inpatient psychiatric admission. They recommended to continue Valium, Lexapro and Cymbalta.  Normocytic anemia: Hemoglobin is stable:      Discharge Instructions  Discharge Instructions     Diet - low sodium heart healthy   Complete by: As directed    Increase activity slowly   Complete by: As directed    No wound care   Complete by: As directed       Allergies as of 06/21/2021       Reactions   Hydrocodone Shortness Of Breath   Latuda [lurasidone Hcl] Anaphylaxis   Magnesium-containing Compounds Anaphylaxis   Tolerated Ensure   Prednisone Anaphylaxis, Swelling, Other (See Comments)   Tongue swelling, lip swelling, throat swelling, per pt    Tramadol Anaphylaxis, Swelling   Codeine Nausea And Vomiting   Trazodone Other (See Comments)   paranoia   Topamax [topiramate] Other (See Comments)  Increases paranoia   Sulfa Antibiotics Itching   Tape Rash        Medication List      STOP taking these medications    ketoconazole 2 % cream Commonly known as: NIZORAL   methocarbamol 500 MG tablet Commonly known as: ROBAXIN       TAKE these medications    albuterol 108 (90 Base) MCG/ACT inhaler Commonly known as: VENTOLIN HFA Inhale 2 puffs into the lungs every 6 (six) hours as needed for wheezing or shortness of breath. What changed: Another medication with the same name was removed. Continue taking this medication, and follow the directions you see here.   amLODipine 5 MG tablet Commonly known as: NORVASC Take 10 mg by mouth every morning.   Asenapine Maleate 10 MG Subl Place 1 tablet (10 mg total) under the tongue 2 (two) times daily.   atorvastatin 20 MG tablet Commonly known as: LIPITOR TAKE 1 TABLET(20 MG) BY MOUTH DAILY What changed: See the new instructions.   carvedilol 6.25 MG tablet Commonly known as: COREG Take 1 tablet (6.25 mg total) by mouth 2 (two) times daily with a meal.   colchicine 0.6 MG tablet Take 2 tablets by mouth at the onset of gout flare, may repeat 1 tablet in 1 hour if symptoms persist   cyclobenzaprine 10 MG tablet Commonly known as: FLEXERIL Take 10 mg by mouth 2 (two) times daily as needed for muscle spasms.   diazepam 5 MG tablet Commonly known as: VALIUM Take 5 mg by mouth at bedtime.   diclofenac 75 MG EC tablet Commonly known as: VOLTAREN Take 75 mg by mouth 2 (two) times daily.   diclofenac Sodium 1 % Gel Commonly known as: Voltaren Apply 4 g topically 4 (four) times daily as needed (pain). What changed:  how much to take when to take this   dicyclomine 10 MG capsule Commonly known as: Bentyl Take 1 capsule (10 mg total) by mouth 3 (three) times daily before meals.   DULoxetine 30 MG capsule Commonly known as: CYMBALTA Take 30 mg by mouth every morning.   escitalopram 20 MG tablet Commonly known as: LEXAPRO Take 20 mg by mouth every morning.   febuxostat 40 MG tablet Commonly known as:  ULORIC Take 40 mg by mouth every morning.   Iron (Ferrous Sulfate) 325 (65 Fe) MG Tabs Take 325 mg by mouth daily.   loperamide 2 MG capsule Commonly known as: IMODIUM Take 2 mg by mouth every 4 (four) hours as needed for diarrhea or loose stools.   montelukast 10 MG tablet Commonly known as: SINGULAIR Take 1 tablet (10 mg total) by mouth at bedtime.   omeprazole 40 MG capsule Commonly known as: PRILOSEC TAKE 1 CAPSULE(40 MG) BY MOUTH DAILY What changed: See the new instructions.   oxybutynin 5 MG tablet Commonly known as: DITROPAN TAKE 1 TABLET(5 MG) BY MOUTH TWICE DAILY   oxyCODONE 5 MG immediate release tablet Commonly known as: Oxy IR/ROXICODONE Take 5 mg by mouth every 4 (four) hours as needed for severe pain or moderate pain.   potassium chloride SA 20 MEQ tablet Commonly known as: KLOR-CON M Take 1 tablet (20 mEq total) by mouth daily.   prazosin 2 MG capsule Commonly known as: MINIPRESS TAKE 1 CAPSULE(2 MG) BY MOUTH AT BEDTIME What changed: See the new instructions.   pregabalin 75 MG capsule Commonly known as: LYRICA Take 75 mg by mouth 2 (two) times daily.   terbinafine 1 % cream Commonly known  as: LamISIL AT Apply 1 application topically 2 (two) times daily.   thiamine 100 MG tablet Take 1 tablet (100 mg total) by mouth daily.   torsemide 20 MG tablet Commonly known as: DEMADEX Take 1 tablet (20 mg total) by mouth daily.   Trelegy Ellipta 100-62.5-25 MCG/ACT Aepb Generic drug: Fluticasone-Umeclidin-Vilant Inhale 1 puff into the lungs daily. What changed: when to take this        Allergies  Allergen Reactions   Hydrocodone Shortness Of Breath   Latuda [Lurasidone Hcl] Anaphylaxis   Magnesium-Containing Compounds Anaphylaxis    Tolerated Ensure   Prednisone Anaphylaxis, Swelling and Other (See Comments)    Tongue swelling, lip swelling, throat swelling, per pt    Tramadol Anaphylaxis and Swelling   Codeine Nausea And Vomiting   Trazodone  Other (See Comments)    paranoia   Topamax [Topiramate] Other (See Comments)    Increases paranoia   Sulfa Antibiotics Itching   Tape Rash    Consultations: Psychiatry   Procedures/Studies: CT Head Wo Contrast  Result Date: 06/16/2021 CLINICAL DATA:  Head trauma.  Altered mental status. EXAM: CT HEAD WITHOUT CONTRAST TECHNIQUE: Contiguous axial images were obtained from the base of the skull through the vertex without intravenous contrast. RADIATION DOSE REDUCTION: This exam was performed according to the departmental dose-optimization program which includes automated exposure control, adjustment of the mA and/or kV according to patient size and/or use of iterative reconstruction technique. COMPARISON:  08/08/2020 FINDINGS: Brain: Periventricular white matter and corona radiata hypodensities favor chronic ischemic microvascular white matter disease. Partially empty sella. Otherwise, the brainstem, cerebellum, cerebral peduncles, thalamus, basal ganglia, basilar cisterns, and ventricular system appear within normal limits. No intracranial hemorrhage, mass lesion, or acute CVA. Vascular: There is atherosclerotic calcification of the cavernous carotid arteries bilaterally. Skull: Unremarkable Sinuses/Orbits: Small left mastoid effusion. Other: No supplemental non-categorized findings. IMPRESSION: 1. No acute intracranial findings. 2. Periventricular white matter and corona radiata hypodensities favor chronic ischemic microvascular white matter disease. 3. Atherosclerosis. 4. Small left mastoid effusion. Electronically Signed   By: Van Clines M.D.   On: 06/16/2021 13:52   DG Pelvis Portable  Result Date: 06/13/2021 CLINICAL DATA:  Pain. EXAM: PORTABLE PELVIS 1-2 VIEWS COMPARISON:  CT abdomen and pelvis 05/20/2021 FINDINGS: There is no evidence of pelvic fracture or diastasis. No pelvic bone lesions are seen. Bones are osteopenic. There are mild degenerative changes of the hips. Soft tissues are  within normal limits. IMPRESSION: 1. No acute bony abnormality. 2. Mild degenerative changes of the hips. 3. Diffuse osteopenia. Electronically Signed   By: Ronney Asters M.D.   On: 06/13/2021 22:53   DG Knee Complete 4 Views Right  Result Date: 06/08/2021 CLINICAL DATA:  Knee pain EXAM: RIGHT KNEE - COMPLETE 4+ VIEW COMPARISON:  None Available. FINDINGS: Remote tibia and fibula fractures which are healed with tibial nail. Degenerative spurring at the knee joint. Subcutaneous reticulation which is generalized and nonspecific. No opaque foreign body or soft tissue gas. No evidence of fracture or subluxation. IMPRESSION: 1. Nonspecific generalized subcutaneous edema. 2. Remote and healed tibial and fibular shaft fractures. 3. Knee osteoarthritis. Electronically Signed   By: Jorje Guild M.D.   On: 06/08/2021 06:08   DG Foot Complete Left  Result Date: 06/08/2021 CLINICAL DATA:  Knee pain and swelling EXAM: LEFT FOOT - COMPLETE 3+ VIEW COMPARISON:  10/15/2020 FINDINGS: Nonspecific subcutaneous swelling which is generalized. Degenerative midfoot and first MTP spurring. No evidence of fracture or erosion. IMPRESSION: Nonspecific soft tissue swelling.  No acute osseous finding or soft tissue emphysema. Electronically Signed   By: Jorje Guild M.D.   On: 06/08/2021 06:11   VAS [name] LOWER EXTREMITY VENOUS (DVT) (ONLY [facility] & [facility])  Result Date: 06/08/2021  Lower Venous DVT Study Patient Name:  JESSAMINE BARCIA [facility]  Date of Exam:   06/08/2021 Medical Rec #: [id number]        Accession #:    [id number] Date of Birth: May 10, 1960        Patient Gender: F Patient Age:   89 years Exam Location:  [facility] Procedure:      VAS [name] LOWER EXTREMITY VENOUS (DVT) Referring Phys: Benjamine Mola REES --------------------------------------------------------------------------------  Indications: Edema.  Risk Factors: None identified. Limitations: Body habitus, poor ultrasound/tissue interface and patient positioning, patient  immobility, patient pain tolerance. Comparison Study: No prior studies. Performing Technologist: Oliver Hum RVT  Examination Guidelines: A complete evaluation includes B-mode imaging, spectral Doppler, color Doppler, and power Doppler as needed of all accessible portions of each vessel. Bilateral testing is considered an integral part of a complete examination. Limited examinations for reoccurring indications may be performed as noted. The reflux portion of the exam is performed with the patient in reverse Trendelenburg.  +---------+---------------+---------+-----------+----------+--------------+ RIGHT    CompressibilityPhasicitySpontaneityPropertiesThrombus Aging +---------+---------------+---------+-----------+----------+--------------+ CFV      Full           Yes      Yes                                 +---------+---------------+---------+-----------+----------+--------------+ SFJ      Full                                                        +---------+---------------+---------+-----------+----------+--------------+ FV Prox  Full                                                        +---------+---------------+---------+-----------+----------+--------------+ FV Mid   Full                                                        +---------+---------------+---------+-----------+----------+--------------+ FV DistalFull                                                        +---------+---------------+---------+-----------+----------+--------------+ PFV      Full                                                        +---------+---------------+---------+-----------+----------+--------------+ POP      Full           Yes  Yes                                 +---------+---------------+---------+-----------+----------+--------------+ PTV      Full                                                         +---------+---------------+---------+-----------+----------+--------------+ PERO     Full                                                        +---------+---------------+---------+-----------+----------+--------------+   +---------+---------------+---------+-----------+----------+-------------------+ LEFT     CompressibilityPhasicitySpontaneityPropertiesThrombus Aging      +---------+---------------+---------+-----------+----------+-------------------+ CFV      Full           Yes      Yes                                      +---------+---------------+---------+-----------+----------+-------------------+ SFJ      Full                                                             +---------+---------------+---------+-----------+----------+-------------------+ FV Prox  Full                                                             +---------+---------------+---------+-----------+----------+-------------------+ FV Mid   Full                                                             +---------+---------------+---------+-----------+----------+-------------------+ FV DistalFull                                                             +---------+---------------+---------+-----------+----------+-------------------+ PFV      Full                                                             +---------+---------------+---------+-----------+----------+-------------------+ POP                     Yes      Yes                                      +---------+---------------+---------+-----------+----------+-------------------+  PTV      Full                                                             +---------+---------------+---------+-----------+----------+-------------------+ PERO                                                  Not well visualized +---------+---------------+---------+-----------+----------+-------------------+     Summary:  RIGHT: - There is no evidence of deep vein thrombosis in the lower extremity. However, portions of this examination were limited- see technologist comments above.  - No cystic structure found in the popliteal fossa.  LEFT: - There is no evidence of deep vein thrombosis in the lower extremity. However, portions of this examination were limited- see technologist comments above.  - No cystic structure found in the popliteal fossa.  *See table(s) above for measurements and observations. Electronically signed by Harold Barban MD on 06/08/2021 at 8:51:46 PM.    Final    (Echo, Carotid, EGD, Colonoscopy, ERCP)    Subjective: She has no new complaints this morning.  Discharge Exam: Vitals:   06/21/21 0446 06/21/21 0758  BP: (!) 160/89   Pulse: 78   Resp: 18   Temp: 97.9 F (36.6 C)   SpO2: 97% 97%   Vitals:   06/20/21 1243 06/20/21 2219 06/21/21 0446 06/21/21 0758  BP: (!) 143/103 (!) 133/94 (!) 160/89   Pulse: 74 73 78   Resp: 16  18   Temp: 98.4 F (36.9 C) 98.8 F (37.1 C) 97.9 F (36.6 C)   TempSrc: Oral Oral Oral   SpO2: 99% 98% 97% 97%  Weight:      Height:        General: Pt is alert, awake, not in acute distress Cardiovascular: RRR, S1/S2 +, no rubs, no gallops Respiratory: CTA bilaterally, no wheezing, no rhonchi Abdominal: Soft, NT, ND, bowel sounds + Extremities: no edema, no cyanosis    The results of significant diagnostics from this hospitalization (including imaging, microbiology, ancillary and laboratory) are listed below for reference.     Microbiology: Recent Results (from the past 240 hour(s))  Resp Panel by RT-PCR (Flu A&B, Covid) Anterior Nasal Swab     Status: None   Collection Time: 06/16/21  1:17 PM   Specimen: Anterior Nasal Swab  Result Value Ref Range Status   SARS Coronavirus 2 by RT PCR NEGATIVE NEGATIVE Final    Comment: (NOTE) SARS-CoV-2 target nucleic acids are NOT DETECTED.  The SARS-CoV-2 RNA is generally detectable in upper  respiratory specimens during the acute phase of infection. The lowest concentration of SARS-CoV-2 viral copies this assay can detect is 138 copies/mL. A negative result does not preclude SARS-Cov-2 infection and should not be used as the sole basis for treatment or other patient management decisions. A negative result may occur with  improper specimen collection/handling, submission of specimen other than nasopharyngeal swab, presence of viral mutation(s) within the areas targeted by this assay, and inadequate number of viral copies(<138 copies/mL). A negative result must be combined with clinical observations, patient history, and epidemiological information. The expected result is Negative.  Fact Sheet for Patients:  EntrepreneurPulse.com.au  Fact  Sheet for Healthcare Providers:  IncredibleEmployment.be  This test is no t yet approved or cleared by the [place] FDA and  has been authorized for detection and/or diagnosis of SARS-CoV-2 by FDA under an Emergency Use Authorization (EUA). This EUA will remain  in effect (meaning this test can be used) for the duration of the COVID-19 declaration under Section 564(b)(1) of the Act, 21 U.S.C.section 360bbb-3(b)(1), unless the authorization is terminated  or revoked sooner.       Influenza A by PCR NEGATIVE NEGATIVE Final   Influenza B by PCR NEGATIVE NEGATIVE Final    Comment: (NOTE) The Xpert Xpress SARS-CoV-2/FLU/RSV plus assay is intended as an aid in the diagnosis of influenza from Nasopharyngeal swab specimens and should not be used as a sole basis for treatment. Nasal washings and aspirates are unacceptable for Xpert Xpress SARS-CoV-2/FLU/RSV testing.  Fact Sheet for Patients: EntrepreneurPulse.com.au  Fact Sheet for Healthcare Providers: IncredibleEmployment.be  This test is not yet approved or cleared by the [place] FDA and has been  authorized for detection and/or diagnosis of SARS-CoV-2 by FDA under an Emergency Use Authorization (EUA). This EUA will remain in effect (meaning this test can be used) for the duration of the COVID-19 declaration under Section 564(b)(1) of the Act, 21 U.S.C. section 360bbb-3(b)(1), unless the authorization is terminated or revoked.  Performed at [facility], [employer] [address], [place], [place] [address]      Labs: BNP (last 3 results) Recent Labs    04/26/21 0349 04/27/21 0340 04/28/21 0233  BNP 26.4 11.9 54.6   Basic Metabolic Panel: Recent Labs  Lab 06/16/21 1312 06/17/21 0538 06/18/21 0825  NA 137 140 136  K 3.3* 3.5 4.0  CL 99 105 104  CO2 '26 28 27  '$ GLUCOSE 81 104* 116*  BUN 21 22 25*  CREATININE 2.16* 1.95* 1.62*  CALCIUM 9.1 8.6* 8.5*  MG 2.1  --   --    Liver Function Tests: Recent Labs  Lab 06/16/21 1312 06/17/21 0538  AST 40 30  ALT 26 23  ALKPHOS 139* 121  BILITOT 0.7 0.3  PROT 7.1 5.8*  ALBUMIN 3.4* 2.7*   No results for input(s): LIPASE, AMYLASE in the last 168 hours. No results for input(s): AMMONIA in the last 168 hours. CBC: Recent Labs  Lab 06/16/21 1312 06/17/21 0538  WBC 6.1 6.2  NEUTROABS 3.6  --   HGB 10.6* 9.1*  HCT 35.1* 30.1*  MCV 90.2 91.2  PLT 309 276   Cardiac Enzymes: Recent Labs  Lab 06/16/21 1310 06/17/21 0538  CKTOTAL 711* 376*   BNP: Invalid input(s): POCBNP CBG: Recent Labs  Lab 06/20/21 0724 06/20/21 1240 06/20/21 1642 06/20/21 2222 06/21/21 0742  GLUCAP 103* 125* 72 116* 98   D-Dimer No results for input(s): DDIMER in the last 72 hours. Hgb A1c No results for input(s): HGBA1C in the last 72 hours. Lipid Profile No results for input(s): CHOL, HDL, LDLCALC, TRIG, CHOLHDL, LDLDIRECT in the last 72 hours. Thyroid function studies No results for input(s): TSH, T4TOTAL, T3FREE, THYROIDAB in the last 72 hours.  Invalid input(s): FREET3 Anemia work up No results for input(s):  VITAMINB12, FOLATE, FERRITIN, TIBC, IRON, RETICCTPCT in the last 72 hours. Urinalysis    Component Value Date/Time   COLORURINE STRAW (A) 06/08/2021 0409   APPEARANCEUR CLEAR 06/08/2021 0409   LABSPEC 1.005 06/08/2021 0409   PHURINE 6.0 06/08/2021 0409   GLUCOSEU NEGATIVE 06/08/2021 0409   HGBUR NEGATIVE 06/08/2021 0409   BILIRUBINUR  NEGATIVE 06/08/2021 0409   BILIRUBINUR NEG 05/21/2014 1155   KETONESUR NEGATIVE 06/08/2021 0409   PROTEINUR 30 (A) 06/08/2021 0409   UROBILINOGEN 0.2 09/30/2019 1557   NITRITE NEGATIVE 06/08/2021 0409   LEUKOCYTESUR NEGATIVE 06/08/2021 0409   Sepsis Labs Invalid input(s): PROCALCITONIN,  WBC,  LACTICIDVEN Microbiology Recent Results (from the past 240 hour(s))  Resp Panel by RT-PCR (Flu A&B, Covid) Anterior Nasal Swab     Status: None   Collection Time: 06/16/21  1:17 PM   Specimen: Anterior Nasal Swab  Result Value Ref Range Status   SARS Coronavirus 2 by RT PCR NEGATIVE NEGATIVE Final    Comment: (NOTE) SARS-CoV-2 target nucleic acids are NOT DETECTED.  The SARS-CoV-2 RNA is generally detectable in upper respiratory specimens during the acute phase of infection. The lowest concentration of SARS-CoV-2 viral copies this assay can detect is 138 copies/mL. A negative result does not preclude SARS-Cov-2 infection and should not be used as the sole basis for treatment or other patient management decisions. A negative result may occur with  improper specimen collection/handling, submission of specimen other than nasopharyngeal swab, presence of viral mutation(s) within the areas targeted by this assay, and inadequate number of viral copies(<138 copies/mL). A negative result must be combined with clinical observations, patient history, and epidemiological information. The expected result is Negative.  Fact Sheet for Patients:  EntrepreneurPulse.com.au  Fact Sheet for Healthcare Providers:   IncredibleEmployment.be  This test is no t yet approved or cleared by the [place] FDA and  has been authorized for detection and/or diagnosis of SARS-CoV-2 by FDA under an Emergency Use Authorization (EUA). This EUA will remain  in effect (meaning this test can be used) for the duration of the COVID-19 declaration under Section 564(b)(1) of the Act, 21 U.S.C.section 360bbb-3(b)(1), unless the authorization is terminated  or revoked sooner.       Influenza A by PCR NEGATIVE NEGATIVE Final   Influenza B by PCR NEGATIVE NEGATIVE Final    Comment: (NOTE) The Xpert Xpress SARS-CoV-2/FLU/RSV plus assay is intended as an aid in the diagnosis of influenza from Nasopharyngeal swab specimens and should not be used as a sole basis for treatment. Nasal washings and aspirates are unacceptable for Xpert Xpress SARS-CoV-2/FLU/RSV testing.  Fact Sheet for Patients: EntrepreneurPulse.com.au  Fact Sheet for Healthcare Providers: IncredibleEmployment.be  This test is not yet approved or cleared by the [place] FDA and has been authorized for detection and/or diagnosis of SARS-CoV-2 by FDA under an Emergency Use Authorization (EUA). This EUA will remain in effect (meaning this test can be used) for the duration of the COVID-19 declaration under Section 564(b)(1) of the Act, 21 U.S.C. section 360bbb-3(b)(1), unless the authorization is terminated or revoked.  Performed at [facility] [place] [address], [place], [place] [address]      Time coordinating discharge: Over 30 minutes  SIGNED:   Charlynne Cousins, MD  [facility] 06/21/2021, 11:53 AM Pager   If 7PM-7AM, please contact night-coverage [email or web address] Password [email or web address]
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
## 2021-06-21 NOTE — Progress Notes (Signed)
[facility] PROGRESS NOTE    Progress Note  [name]  SWF:[id number] DOB: 1961/01/11 DOA: 06/16/2021 PCP: [name], MD     Brief Narrative:   Tiffany [name] is an 61 y.o. female past medical history significant for anoxic brain injury, osteoarthritis asthma binge eating disorder, coronary artery disease, chronic diastolic heart failure, chronic kidney disease stage III chronic pain syndrome posttraumatic stress disorder, COPD not on oxygen, polysubstance abuse specially cocaine, diabetes mellitus type 2, history of NSTEMI schizoaffective disorder, history of nonhemorrhagic stroke comes into the ED for suicidal ideations because of chronic pain and living situation.  CT of the head showed periventricular white matter and corona radiata hypodensity favoring chronic ischemic microvascular white matter changes.  Develop acute kidney injury in the setting of torsemide and NSAIDs these were held with started on IV fluid and his creatinine returned to baseline. Physical therapy evaluated the patient and recommended skilled nursing facility.   Assessment/Plan:   AKI (acute kidney injury) on chronic kidney disease stage IIIb: Baseline creatinine 1.6-1.4 on admission of 2.1. In the setting of torsemide and NSAID these were held, likely prerenal. Creatinine back to baseline. Awaiting skilled nursing facility placement.  Mild rhabdomyolysis Resolved with fluid resuscitation.  Prolonged QTc: Try to keep potassium greater than 4 magnesium greater than 2. Likely due to hypokalemia.  Hypokalemia: It was repleted now resolved 3.5 try to keep above 4.  Dyslipidemia: Continue to hold statins.  Diabetes mellitus type 2 with peripheral neuropathy: Continue Lyrica. A1c of 5.1, has required no insulin blood glucose well controlled.  COPD not on oxygen: Currently stable, not on oxygen. Continue inhalers.  Chronic diastolic heart failure: Volume depleted continue  beta-blockers. Hold diuretics.  Essential hypertension: Relatively well controlled continue to hold torsemide. Continue Coreg.  Anxiety and depression: Psych was consulted recommended Valium, she endorses passive suicidal ideation they recommended to continue current regimen discontinue Vanzee. Continue Lexapro and Cymbalta. Normocytic anemia: Hemoglobin relatively stable.  Mild protein caloric malnutrition:   DVT prophylaxis: lovenox Family Communication:none Status is: Observation The patient will require care spanning > 2 midnights and should be moved to inpatient because: Acute kidney injury in the setting of rhabdomyolysis.    Code Status:     Code Status Orders  (From admission, onward)           Start     Ordered   06/16/21 1542  Full code  Continuous        06/16/21 1543           Code Status History     Date Active Date Inactive Code Status Order ID Comments User Context   05/20/2021 0414 06/03/2021 0053 Full Code [id number]  Kayleen Memos, DO ED   04/24/2021 1956 05/02/2021 1650 Full Code [id number]  Marcelyn Bruins, MD ED   03/29/2021 2013 04/07/2021 0450 Full Code [id number]  Orene Desanctis, DO Inpatient   01/13/2021 0123 01/14/2021 2252 Full Code [id number]  Howerter, Ethelda Chick, DO ED   01/08/2021 1537 01/08/2021 1836 Full Code [id number]  Isla Pence, MD ED   01/05/2021 2232 01/06/2021 2104 Full Code [id number]  Henderly, Britni A, PA-C ED   12/16/2020 2037 12/17/2020 1800 Full Code [id number]  Lacretia Leigh, MD ED   11/16/2020 1748 11/18/2020 0034 Full Code [id number]  Loni Beckwith, PA-C ED   10/15/2020 1724 10/17/2020 1852 Full Code [id number]  Davonna Belling, MD ED   08/08/2020 1940 08/16/2020 2123 Full Code [id number]  Etta Quill, DO ED  07/23/2020 0323 07/28/2020 2006 Full Code [id number]  Shela Leff, MD ED   05/11/2020 1811 05/16/2020 2124 Full Code [id number]  Lequita Halt, MD ED   02/18/2020 1641 02/26/2020 2237 Full Code [id number]  Julian Hy, DO ED   10/27/2019 0921 11/03/2019 0024 Full Code [id number]  Jonnie Finner, DO ED   02/03/2019 1731 02/04/2019 1655 Full Code [id number]  Milton Ferguson, MD ED   02/03/2019 1427 02/03/2019 1731 Full Code [id number]  Lennice Sites, DO ED   10/07/2018 0013 10/08/2018 1355 Full Code [id number]  Margarita Mail, PA-C ED   10/02/2018 0858 10/05/2018 1654 Full Code [id number]  Dalia Heading, PA-C ED   11/21/2017 2227 11/23/2017 2056 Full Code [id number]  Etheleen Nicks, MD ED   06/19/2017 1412 06/19/2017 2027 Full Code [id number]  Bensimhon, Shaune Pascal, MD Inpatient   01/06/2017 0417 01/07/2017 2117 Full Code [id number]  Caroline More, DO Inpatient   11/03/2016 1953 11/05/2016 2243 Full Code [id number]  Verner Mould, MD ED   10/18/2016 1838 10/20/2016 2043 Full Code [id number]  Bufford Lope, DO Inpatient   10/07/2016 0200 10/09/2016 2340 Full Code [id number]  Sela Hilding, MD ED   09/08/2016 0709 09/21/2016 1858 Full Code [id number]  Omar Person, NP ED   07/10/2016 0205 07/20/2016 2246 Full Code [id number]  Marjie Skiff, MD Inpatient   05/09/2016 1015 05/12/2016 1816 Full Code [id number]  Lorna Few, DO Inpatient   04/21/2016 1813 04/24/2016 1826 Full Code [id number]  Smiley Houseman, MD ED   04/10/2016 1632 04/15/2016 2043 Full Code [id number]  Sela Hilding, MD Inpatient   12/01/2015 2144 12/06/2015 1920 Full Code [id number]  Clovis Fredrickson, MD Inpatient   11/25/2015 0141 12/01/2015 2137 Full Code [id number]  Elberta Leatherwood, MD Inpatient   08/19/2015 2134 08/23/2015 1646 Full Code [id number]  [name], DO Inpatient   07/09/2015 1210 07/11/2015 1803 Full Code [id number]  Jeannett Senior, PA-C ED   06/22/2015 0549 06/25/2015 1852 Full Code [id number]  Juanito Doom, MD ED   06/22/2015 0233 06/22/2015 0549 Full Code [id number]  Rogue Bussing, MD ED   07/08/2014 0331 07/09/2014 1836 Full Code [id number]  Olin Hauser, DO Inpatient   01/04/2013 0527  01/06/2013 2102 Full Code [id number]  Leeanne Rio, MD Inpatient   04/24/2011 1742 04/27/2011 2119 Full Code [id number]  Long, Gilles Chiquito, RN Inpatient         IV Access:   Peripheral IV   Procedures and diagnostic studies:   No results found.   Medical Consultants:   None.   Subjective:    [name] complaining that her pain medication does not last very long.  Objective:    Vitals:   06/20/21 1243 06/20/21 2219 06/21/21 0446 06/21/21 0758  BP: (!) 143/103 (!) 133/94 (!) 160/89   Pulse: 74 73 78   Resp: 16  18   Temp: 98.4 F (36.9 C) 98.8 F (37.1 C) 97.9 F (36.6 C)   TempSrc: Oral Oral Oral   SpO2: 99% 98% 97% 97%  Weight:      Height:       SpO2: 97 %   Intake/Output Summary (Last 24 hours) at 06/21/2021 0808 Last data filed at 06/21/2021 0500 Gross per 24 hour  Intake 840 ml  Output 1550 ml  Net -710 ml    Filed Weights   06/16/21 1114  Weight: 78 kg    Exam: General exam: In no acute distress. Respiratory system: Good  air movement and clear to auscultation. Cardiovascular system: S1 & S2 heard, RRR. No JVD. Gastrointestinal system: Abdomen is nondistended, soft and nontender.  Extremities: No pedal edema. Skin: No rashes, lesions or ulcers Psychiatry: Judgement and insight appear normal. Mood & affect appropriate.   Data Reviewed:    Labs: Basic Metabolic Panel: Recent Labs  Lab 06/16/21 1312 06/17/21 0538 06/18/21 0825  NA 137 140 136  K 3.3* 3.5 4.0  CL 99 105 104  CO2 '26 28 27  '$ GLUCOSE 81 104* 116*  BUN 21 22 25*  CREATININE 2.16* 1.95* 1.62*  CALCIUM 9.1 8.6* 8.5*  MG 2.1  --   --     GFR Estimated Creatinine Clearance: 34.8 mL/min (A) (by C-G formula based on SCr of 1.62 mg/dL (H)). Liver Function Tests: Recent Labs  Lab 06/16/21 1312 06/17/21 0538  AST 40 30  ALT 26 23  ALKPHOS 139* 121  BILITOT 0.7 0.3  PROT 7.1 5.8*  ALBUMIN 3.4* 2.7*    No results for input(s): LIPASE, AMYLASE in the last 168  hours. No results for input(s): AMMONIA in the last 168 hours. Coagulation profile No results for input(s): INR, PROTIME in the last 168 hours. COVID-19 Labs  No results for input(s): DDIMER, FERRITIN, LDH, CRP in the last 72 hours.  Lab Results  Component Value Date   SARSCOV2NAA NEGATIVE 06/16/2021   SARSCOV2NAA NEGATIVE 05/03/2021   [place] NEGATIVE 04/24/2021   [place] NEGATIVE 01/13/2021    CBC: Recent Labs  Lab 06/16/21 1312 06/17/21 0538  WBC 6.1 6.2  NEUTROABS 3.6  --   HGB 10.6* 9.1*  HCT 35.1* 30.1*  MCV 90.2 91.2  PLT 309 276    Cardiac Enzymes: Recent Labs  Lab 06/16/21 1310 06/17/21 0538  CKTOTAL 711* 376*    BNP (last 3 results) Recent Labs    07/13/20 1310  PROBNP 201    CBG: Recent Labs  Lab 06/20/21 0724 06/20/21 1240 06/20/21 1642 06/20/21 2222 06/21/21 0742  GLUCAP 103* 125* 72 116* 98    D-Dimer: No results for input(s): DDIMER in the last 72 hours. Hgb A1c: No results for input(s): HGBA1C in the last 72 hours.  Lipid Profile: No results for input(s): CHOL, HDL, LDLCALC, TRIG, CHOLHDL, LDLDIRECT in the last 72 hours. Thyroid function studies: No results for input(s): TSH, T4TOTAL, T3FREE, THYROIDAB in the last 72 hours.  Invalid input(s): FREET3 Anemia work up: No results for input(s): VITAMINB12, FOLATE, FERRITIN, TIBC, IRON, RETICCTPCT in the last 72 hours. Sepsis Labs: Recent Labs  Lab 06/16/21 1312 06/17/21 0538  WBC 6.1 6.2    Microbiology Recent Results (from the past 240 hour(s))  Resp Panel by RT-PCR (Flu A&B, Covid) Anterior Nasal Swab     Status: None   Collection Time: 06/16/21  1:17 PM   Specimen: Anterior Nasal Swab  Result Value Ref Range Status   SARS Coronavirus 2 by RT PCR NEGATIVE NEGATIVE Final    Comment: (NOTE) SARS-CoV-2 target nucleic acids are NOT DETECTED.  The SARS-CoV-2 RNA is generally detectable in upper respiratory specimens during the acute phase of infection. The  lowest concentration of SARS-CoV-2 viral copies this assay can detect is 138 copies/mL. A negative result does not preclude SARS-Cov-2 infection and should not be used as the sole basis for treatment or other patient management decisions. A negative result may occur with  improper specimen collection/handling, submission of specimen other than nasopharyngeal swab, presence of viral mutation(s) within the areas targeted by this assay, and inadequate  number of viral copies(<138 copies/mL). A negative result must be combined with clinical observations, patient history, and epidemiological information. The expected result is Negative.  Fact Sheet for Patients:  EntrepreneurPulse.com.au  Fact Sheet for Healthcare Providers:  IncredibleEmployment.be  This test is no t yet approved or cleared by the [place] FDA and  has been authorized for detection and/or diagnosis of SARS-CoV-2 by FDA under an Emergency Use Authorization (EUA). This EUA will remain  in effect (meaning this test can be used) for the duration of the COVID-19 declaration under Section 564(b)(1) of the Act, 21 U.S.C.section 360bbb-3(b)(1), unless the authorization is terminated  or revoked sooner.       Influenza A by PCR NEGATIVE NEGATIVE Final   Influenza B by PCR NEGATIVE NEGATIVE Final    Comment: (NOTE) The Xpert Xpress SARS-CoV-2/FLU/RSV plus assay is intended as an aid in the diagnosis of influenza from Nasopharyngeal swab specimens and should not be used as a sole basis for treatment. Nasal washings and aspirates are unacceptable for Xpert Xpress SARS-CoV-2/FLU/RSV testing.  Fact Sheet for Patients: EntrepreneurPulse.com.au  Fact Sheet for Healthcare Providers: IncredibleEmployment.be  This test is not yet approved or cleared by the [place] FDA and has been authorized for detection and/or diagnosis of SARS-CoV-2 by FDA under  an Emergency Use Authorization (EUA). This EUA will remain in effect (meaning this test can be used) for the duration of the COVID-19 declaration under Section 564(b)(1) of the Act, 21 U.S.C. section 360bbb-3(b)(1), unless the authorization is terminated or revoked.  Performed at [facility], [place] [address], [place], [place] [address]      Medications:    atorvastatin  20 mg Oral q morning   carvedilol  6.25 mg Oral BID WC   dicyclomine  10 mg Oral TID AC   DULoxetine  30 mg Oral q morning   escitalopram  20 mg Oral q morning   febuxostat  40 mg Oral q morning   fluticasone furoate-vilanterol  1 puff Inhalation Daily   And   umeclidinium bromide  1 puff Inhalation Daily   montelukast  10 mg Oral QHS   oxybutynin  5 mg Oral BID   pantoprazole  40 mg Oral Daily   potassium chloride SA  20 mEq Oral Daily   prazosin  2 mg Oral QHS   pregabalin  75 mg Oral BID   thiamine  100 mg Oral Daily   Continuous Infusions:      LOS: 4 days   Charlynne Cousins  [facility]  06/21/2021, 8:08 AM

## 2021-06-21 NOTE — Patient Outreach (Signed)
[facility]) Care Management  06/21/2021  Tiffany [name] Aug 10, 1960 [id number]   Incoming call received from member stating that she was being discharged home.  Discussed recommendations of SNF placement as well as concern of her not having the support in the home.  [name] adamant about not being placed, report she has a friend that will come over to bring her food.  Discussed needing more than one meal a day and someone throughout the day for other ADL's, she state she is able to do these herself.  She remains interested in PCS/home aide, encouraged to call [place] to secure agency.  Contact information provided again.  Will follow up within the next 2 business days after discharge.  [name], RN, MSN, [place] Manager [phone]

## 2021-06-22 ENCOUNTER — Inpatient Hospital Stay (HOSPITAL_COMMUNITY): Payer: Medicare Other

## 2021-06-22 LAB — GLUCOSE, CAPILLARY
Glucose-Capillary: 105 mg/dL — ABNORMAL HIGH (ref 70–99)
Glucose-Capillary: 122 mg/dL — ABNORMAL HIGH (ref 70–99)
Glucose-Capillary: 126 mg/dL — ABNORMAL HIGH (ref 70–99)

## 2021-06-22 MED ORDER — AMLODIPINE BESYLATE 5 MG PO TABS: 5.0000 mg | ORAL_TABLET | Freq: Every day | ORAL | 0 refills | Status: DC

## 2021-06-22 NOTE — TOC Transition Note (Signed)
Transition of Care [facility]) - CM/SW Discharge Note   Patient Details  Name: Tiffany [name] MRN: [id number] Date of Birth: Sep 18, 1960  [facility]) CM/SW Contact:  Lynnell Catalan, RN Phone Number: 06/22/2021, 3:54 PM   Clinical Narrative:     Spoke at bedside with pt and sister [facility] via phone at length for dc planning. Dee asking for specific things that have been set up for pt for home. Karena Addison was given information about home health that was ordered and phone number for [facility]. It was also explained to [place] that we are very concerned about the pt going home as she is requiring a lot of assistance. Karena Addison was informed that pt had refused to go to the SNF bed that was offered to her. Karena Addison states that pt has the right to choose to go home and she will help her make a different plan if home doesn't work. Karena Addison was also informed that a [facility] has been in touch with pt and she will continue to follow pt at dc.  Paperwork for [facility] left with nursing staff. RN to call [facility] once dc is confirmed.        Readmission Risk Interventions    05/24/2021    6:19 PM 07/26/2020    2:21 PM 02/23/2020    1:16 PM  Readmission Risk Prevention Plan  Transportation Screening Complete Complete Complete  Medication Review ([place]) Referral to Pharmacy Complete Complete  PCP or Specialist appointment within 3-5 days of discharge Complete Complete Complete  HRI or Home Care Consult Complete Complete Complete  SW Recovery Care/Counseling Consult Complete Complete Complete  Palliative Care Screening  Not Applicable Complete  Skilled Nursing Facility Complete Complete Complete

## 2021-06-22 NOTE — Care Management Important Message (Signed)
Important Message  Patient Details IM Letter given to the Patient. Name: Tiffany [name] MRN: [id number] Date of Birth: [birth year]   Medicare Important Message Given:  Yes     Kerin Salen 06/22/2021, 10:02 AM

## 2021-06-22 NOTE — Progress Notes (Signed)
Pt reports extreme pain to legs and back, requesting pain med and muscle relaxer. Pt also requests to see a MD and has expressed concern about her care.   Vitals checked and reviewed - BP 204/109  Pt given requested meds. On call APP Olena Heckle notified of high BP.  Pt refused Coreg, stating that she takes Amlodipine and has not had any since this encounter to the hospital.   On call APP notified of pt concerns. Discharge order canceled as well due to BP. Amlodipine was ordered and given.   On call APP along with pt family members at bedside to discuss care.   Pt states that she appreciated NP Olena Heckle addressing concerns and answering her questions.  Updated plan of care was discussed with pt and pt's family. All parties agreed to plan of care with understanding.

## 2021-06-22 NOTE — Progress Notes (Signed)
Occupational Therapy Treatment Patient Details Name: Tiffany [name] MRN: [id number] DOB: 1960-04-27 Today's Date: 06/22/2021   History of present illness 61 y.o. female past medical history significant for anoxic brain injury, osteoarthritis asthma binge eating disorder, coronary artery disease, chronic diastolic heart failure, chronic kidney disease stage III chronic pain syndrome posttraumatic stress disorder, COPD not on oxygen, polysubstance abuse specially cocaine, diabetes mellitus type 2, history of NSTEMI schizoaffective disorder, history of nonhemorrhagic stroke comes into the ED for suicidal ideations because of chronic pain and living situation. Dx of AKI, mild rhabdomyolysis.   OT comments  Patient was noted to have some self limiting behaviors during session with patient sitting on edge of bed then declining to further participate in session. Patient was verbally resistive to all education on back precautions during session. Patient's discharge plan remains appropriate at this time. OT will continue to follow acutely.     Recommendations for follow up therapy are one component of a multi-disciplinary discharge planning process, led by the attending physician.  Recommendations may be updated based on patient status, additional functional criteria and insurance authorization.    Follow Up Recommendations  Skilled nursing-short term rehab (<3 hours/day)    Assistance Recommended at Discharge Frequent or constant Supervision/Assistance  Patient can return home with the following  Two people to help with walking and/or transfers;Direct supervision/assist for medications management;Direct supervision/assist for financial management;Assist for transportation;Help with stairs or ramp for entrance;Assistance with cooking/housework;A lot of help with bathing/dressing/bathroom   Equipment Recommendations  Hospital bed    Recommendations for Other Services      Precautions / Restrictions  Precautions Precautions: Fall Precaution Comments: back precautions due to cauda equina Restrictions Weight Bearing Restrictions: No       Mobility Bed Mobility Overal bed mobility: Needs Assistance Bed Mobility: Supine to Sit Rolling: Max assist, +2 for physical assistance, +2 for safety/equipment         General bed mobility comments: with HOB elevated and use of rails, can partially able to long sit in bed. education on proper rolling techniques Greatest difficulty was scooting to EOB.  Required Max Assist with use of pad to complete.    Transfers                         Balance Overall balance assessment: Needs assistance Sitting-balance support: No upper extremity supported, Feet unsupported Sitting balance-Leahy Scale: Poor Sitting balance - Comments: leans to R side with resting on elbow. patient reproted " this is the way to reduce pain" noted to have twisting in back with edcation on precautions. Postural control: Right lateral lean, Posterior lean                                 ADL either performed or assessed with clinical judgement   ADL Overall ADL's : Needs assistance/impaired                                            Extremity/Trunk Assessment              Vision       Perception     Praxis      Cognition Arousal/Alertness: Awake/alert Behavior During Therapy: WFL for tasks assessed/performed Overall Cognitive Status: Difficult to assess  Exercises      Shoulder Instructions       General Comments      Pertinent Vitals/ Pain       Pain Assessment Pain Assessment: Faces Faces Pain Scale: Hurts even more Pain Location: lower extremities with movement Pain Descriptors / Indicators: Grimacing, Guarding Pain Intervention(s): Limited activity within patient's tolerance, Monitored during session, Patient requesting pain meds-RN notified,  Repositioned  Home Living                                          Prior Functioning/Environment              Frequency  Min 2X/week        Progress Toward Goals  OT Goals(current goals can now be found in the care plan section)  Progress towards OT goals: OT to reassess next treatment     Plan Discharge plan remains appropriate    Co-evaluation    PT/OT/SLP Co-Evaluation/Treatment: Yes Reason for Co-Treatment: Complexity of the patient's impairments (multi-system involvement);For patient/therapist safety;To address functional/ADL transfers PT goals addressed during session: Mobility/safety with mobility;Balance OT goals addressed during session: ADL's and self-care      AM-PAC OT "6 Clicks" Daily Activity     Outcome Measure   Help from another person eating meals?: A Little Help from another person taking care of personal grooming?: A Little Help from another person toileting, which includes using toliet, bedpan, or urinal?: Total Help from another person bathing (including washing, rinsing, drying)?: A Lot Help from another person to put on and taking off regular upper body clothing?: A Little Help from another person to put on and taking off regular lower body clothing?: Total 6 Click Score: 13    End of Session Equipment Utilized During Treatment: Other (comment) (STEDY)  OT Visit Diagnosis: Other abnormalities of gait and mobility (R26.89);Muscle weakness (generalized) (M62.81)   Activity Tolerance Patient limited by pain   Patient Left in bed;with call bell/phone within reach;with bed alarm set   Nurse Communication          Time: 1740-8144 OT Time Calculation (min): 14 min  Charges: OT General Charges $OT Visit: 1 Visit  Jackelyn Poling OTR/L, [facility] Office# [phone] Pager# [phone]   Marcellina Millin 06/22/2021, 11:31 AM

## 2021-06-22 NOTE — Plan of Care (Signed)
  Problem: Education: Goal: Ability to describe self-care measures that may prevent or decrease complications (Diabetes Survival Skills Education) will improve Outcome: Progressing Goal: Individualized Educational Video(s) Outcome: Progressing   Problem: Coping: Goal: Ability to adjust to condition or change in health will improve Outcome: Progressing   Problem: Fluid Volume: Goal: Ability to maintain a balanced intake and output will improve Outcome: Progressing   Problem: Health Behavior/Discharge Planning: Goal: Ability to identify and utilize available resources and services will improve Outcome: Progressing Goal: Ability to manage health-related needs will improve Outcome: Progressing   Problem: Metabolic: Goal: Ability to maintain appropriate glucose levels will improve Outcome: Progressing   Problem: Nutritional: Goal: Maintenance of adequate nutrition will improve Outcome: Progressing Goal: Progress toward achieving an optimal weight will improve Outcome: Progressing   Problem: Skin Integrity: Goal: Risk for impaired skin integrity will decrease Outcome: Progressing   Problem: Tissue Perfusion: Goal: Adequacy of tissue perfusion will improve Outcome: Progressing   Pt reporting pain 10/10 today.  PRNs administered per MAR.

## 2021-06-22 NOTE — Progress Notes (Addendum)
Today's Vitals   06/22/21 0853 06/22/21 0945 06/22/21 1100 06/22/21 1141  BP: 125/67     Pulse: 70     Resp:      Temp:      TempSrc:      SpO2:  95%    Weight:      Height:      PainSc:   10-Worst pain ever 6    Body mass index is 31.45 kg/m.  On physical exam: Pupils are equal round and reactive to light, conjunctiva pink, no scleral icterus, oral mucosa is moist, the patient's lungs are clear and respiratory rate is normal, she is in normal sinus rhythm without any murmurs, abdomen is soft nontender nondistended with good bowel sounds.  Lower extremities are normal in appearance but tender to palpation.  No pedal edema.  No rashes or ulcers noted on exam.  Mentating well without any signs of anxiety or depression.  He was admitted on 9/1 with suicidal intentions and chronic leg pain.Found to have mild AKI which was treated.  This patient has come to the ED numerous times over this past year.  She has been unable to ambulate.  She has had multiple hospital admissions for suicidal ideation, drug use and mental illness with paranoia delusions anxiety and mania.  Psychiatry has evaluated her during this hospital stay and feels that she is stable to be discharged home on her prior medications.  This patient was discharged yesterday by Dr. Olevia Bowens.  Please see discharge summary from 06/21/2021.  She did not leave since, in the evening, she developed hypertension which was treated with amlodipine.  Blood pressure improved and is quite stable today.  She has no other active issues and she is stable to be discharged home.     I have had extensive conversations with the patient, her mother and her sister Karena [facility] today.  Per PT notes, the patient is a 2+ assist for ambulation.  The patient continues to decline to go to the SNF that has been offered to her because it is a 1 star facility.  When this patient was last hospitalized, she refused skilled nursing facilities.    Upon my conversation with the  patient's mother and sister, they are angry because she was discharged home.  The patient's sister states that the mother is unable to take care of the patient.  The patient's mother states she needs to stay in the hospital longer to get physical therapy.  The patient states that she wants to stay in the hospital and receive physical therapy.  I have explained that this is not possible and she can either go to skilled nursing (which she refuses to) or go home with home health.  Ultimately the patient states that she wants to go home and receive home health physical therapy.  The patient's sister is on the phone and hears her state this.  She request that home health physical therapy be ordered for her.  I have explained that transitions of care wrote a note yesterday that the [facility] liaison was contacted to resume services for home health PT, OT, RN, aide and social worker.  The patient was also requesting ambulance transfer to home and TOC arranged this transport.  I have contacted Alinda Sierras again today to speak with the patient and sister about the arrangements that have already been made.  I have told the patient that I am concerned about her choice to go home with home health as I feel that she would  be better off in a rehab facility where she could be adequately cared for.  I have expressed this to the patient's mother and the sister as well.  The patient insists that she is not going to a rehab facility and will go home.  Due to the fact that she is competent to make her own decisions, we are obligated to proceed with the patient's requests.  Upon reviewing the chart, I see that [facility] has contacted the patient multiple times and the patient has agreed to [facility] services as well.

## 2021-06-22 NOTE — Progress Notes (Signed)
All personal belongings were returned to pt yesterday during dayshift in preparation of pt being discharged home.  Discharge canceled. All personal belongings placed in draw away from pt - pt was adamant that night RN not remove personal belongings from room and that she did not want her family to know or see them. Pt was ok with all belongings placed in drawer of armoire.

## 2021-06-22 NOTE — Progress Notes (Signed)
Physical Therapy Treatment Patient Details Name: Tiffany [name] MRN: [id number] DOB: [birth year] Today's Date: 06/22/2021   History of Present Illness 61 y.o. female past medical history significant for anoxic brain injury, osteoarthritis asthma binge eating disorder, coronary artery disease, chronic diastolic heart failure, chronic kidney disease stage III chronic pain syndrome posttraumatic stress disorder, COPD not on oxygen, polysubstance abuse specially cocaine, diabetes mellitus type 2, history of NSTEMI schizoaffective disorder, history of nonhemorrhagic stroke comes into the ED for suicidal ideations because of chronic pain and living situation. Dx of AKI, mild rhabdomyolysis.    PT Comments    +2 max assist for bed mobility. Pt sat at edge of bed for several minutes. She refused to attempt sit to stand transfer using a Stedy, due to pain. Pain medication was requested during session.    Recommendations for follow up therapy are one component of a multi-disciplinary discharge planning process, led by the attending physician.  Recommendations may be updated based on patient status, additional functional criteria and insurance authorization.  Follow Up Recommendations  Skilled nursing-short term rehab (<3 hours/day)     Assistance Recommended at Discharge    Patient can return home with the following Two people to help with walking and/or transfers;A lot of help with bathing/dressing/bathroom;Assist for transportation;Help with stairs or ramp for entrance;Assistance with cooking/housework   Equipment Recommendations  None recommended by PT    Recommendations for Other Services       Precautions / Restrictions Precautions Precautions: Fall Precaution Comments: back precautions due to cauda equina Restrictions Weight Bearing Restrictions: No     Mobility  Bed Mobility Overal bed mobility: Needs Assistance Bed Mobility: Supine to Sit, Sit to Supine Rolling: Max assist, +2 for  physical assistance, +2 for safety/equipment     Sit to supine: Max assist   General bed mobility comments: with HOB elevated and use of rails, can partially able to long sit in bed. education on proper rolling techniques Greatest difficulty was scooting to EOB.  Required Max Assist with use of pad to complete. Pt sat at EOB a few minutes, she refused to attempt sit to stand with Stedy 2* pain. Pain meds were requested during PT session.    Transfers                   General transfer comment: pt refused 2* pain    Ambulation/Gait                   Stairs             Wheelchair Mobility    Modified Rankin (Stroke Patients Only)       Balance                                            Cognition Arousal/Alertness: Awake/alert   Overall Cognitive Status: Within Functional Limits for tasks assessed                                 General Comments: appears AxO x 3 selective with which questions she will answer.  Required MAX encouragement to participate.        Exercises      General Comments        Pertinent Vitals/Pain Pain Assessment Faces Pain Scale: Hurts even more Pain Location:  lower extremities with movement Pain Descriptors / Indicators: Grimacing, Guarding Pain Intervention(s): Limited activity within patient's tolerance, Monitored during session, Patient requesting pain meds-RN notified, Repositioned    Home Living                          Prior Function            PT Goals (current goals can now be found in the care plan section) Acute Rehab PT Goals Patient Stated Goal: "I want to walk" PT Goal Formulation: With patient Time For Goal Achievement: 07/01/21 Potential to Achieve Goals: Fair Progress towards PT goals: Not progressing toward goals - comment (pain limiting progress)    Frequency    Min 2X/week      PT Plan Current plan remains appropriate    Co-evaluation  PT/OT/SLP Co-Evaluation/Treatment: Yes Reason for Co-Treatment: Complexity of the patient's impairments (multi-system involvement);For patient/therapist safety;To address functional/ADL transfers PT goals addressed during session: Mobility/safety with mobility;Balance OT goals addressed during session: ADL's and self-care      AM-PAC PT "6 Clicks" Mobility   Outcome Measure  Help needed turning from your back to your side while in a flat bed without using bedrails?: A Lot Help needed moving from lying on your back to sitting on the side of a flat bed without using bedrails?: A Lot Help needed moving to and from a bed to a chair (including a wheelchair)?: Total Help needed standing up from a chair using your arms (e.g., wheelchair or bedside chair)?: Total Help needed to walk in hospital room?: Total Help needed climbing 3-5 steps with a railing? : Total 6 Click Score: 8    End of Session   Activity Tolerance: Patient limited by pain Patient left: in bed;with bed alarm set;with call bell/phone within reach Nurse Communication: Mobility status PT Visit Diagnosis: Muscle weakness (generalized) (M62.81);History of falling (Z91.81);Difficulty in walking, not elsewhere classified (R26.2);Other abnormalities of gait and mobility (R26.89) Pain - part of body: Leg     Time: 1027-1040 PT Time Calculation (min) (ACUTE ONLY): 13 min  Charges:  $Therapeutic Activity: 8-22 mins                     Blondell Reveal Kistler PT 06/22/2021  Acute Rehabilitation Services Pager [phone] Office [phone]

## 2021-06-23 ENCOUNTER — Telehealth: Payer: Self-pay

## 2021-06-23 ENCOUNTER — Other Ambulatory Visit: Payer: Self-pay | Admitting: *Deleted

## 2021-06-23 DIAGNOSIS — I10 Essential (primary) hypertension: Secondary | ICD-10-CM

## 2021-06-23 DIAGNOSIS — B353 Tinea pedis: Secondary | ICD-10-CM

## 2021-06-23 DIAGNOSIS — K219 Gastro-esophageal reflux disease without esophagitis: Secondary | ICD-10-CM

## 2021-06-23 DIAGNOSIS — J42 Unspecified chronic bronchitis: Secondary | ICD-10-CM

## 2021-06-23 DIAGNOSIS — R197 Diarrhea, unspecified: Secondary | ICD-10-CM

## 2021-06-23 DIAGNOSIS — J441 Chronic obstructive pulmonary disease with (acute) exacerbation: Secondary | ICD-10-CM

## 2021-06-23 DIAGNOSIS — R600 Localized edema: Secondary | ICD-10-CM

## 2021-06-23 DIAGNOSIS — M109 Gout, unspecified: Secondary | ICD-10-CM

## 2021-06-23 NOTE — Patient Outreach (Signed)
Tiffany [name]) Care Management Telephonic RN Care Manager Note   06/24/2021 Name:  Tiffany [name] MRN:  [id number] DOB:  10/01/1960  Summary: Tiffany [name] call placed to member post discharge, successful.  Call placed to [facility], provided with correct contact information.  Denies any urgent concerns, encouraged to contact this care manager with questions.     Subjective: Tiffany [name] is an 61 y.o. year old female who is a primary patient of Tiffany Rakes, MD. The care management team was consulted for assistance with care management and/or care coordination needs.    Telephonic RN Care Manager completed Telephone Visit today.  Objective:   Medications Reviewed Today     Reviewed by [name], RN (Registered Nurse) on 06/24/21 at 1051  Med List Status: <None>   Medication Order Taking? Sig Documenting Provider Last Dose Status Informant  albuterol (VENTOLIN HFA) 108 (90 Base) MCG/ACT inhaler [id number] Yes Inhale 2 puffs into the lungs every 6 (six) hours as needed for wheezing or shortness of breath. Tiffany Rakes, MD Taking Active Self, Pharmacy Records  amLODipine (NORVASC) 5 MG tablet [id number] Yes Take 10 mg by mouth every morning. [provider] Taking Active Self, Pharmacy Records           Med Note Orvan Seen, Sharlette Dense   Fri Jun 17, 2021  8:27 AM) #30 filled 04/12/21 per bottle (#11 left) - pt insists that she takes 2 tablets every morning  amLODipine (NORVASC) 5 MG tablet [id number]  Take 1 tablet (5 mg total) by mouth at bedtime. Tiffany Rakes, MD  Active   Asenapine Maleate 10 MG SUBL [id number] Yes Place 1 tablet (10 mg total) under the tongue 2 (two) times daily. Shelly Coss, MD Taking Active Self, Pharmacy Records           Med Note Orvan Seen, Sharlette Dense   Fri Jun 17, 2021  8:33 AM) Not seen in bag with pt's other medications  atorvastatin (LIPITOR) 20 MG tablet [id number]  Take 1 tablet (20 mg total) by mouth daily. Tiffany Rakes, MD  Active    carvedilol (COREG) 6.25 MG tablet [id number]  Take 1 tablet (6.25 mg total) by mouth 2 (two) times daily with a meal. Tiffany Rakes, MD  Active   colchicine 0.6 MG tablet [id number]  Take 2 tablets by mouth at the onset of gout flare, may repeat 1 tablet in 1 hour if symptoms persist Tiffany Rakes, MD  Active   cyclobenzaprine (FLEXERIL) 10 MG tablet [id number]  Take 10 mg by mouth 2 (two) times daily as needed for muscle spasms. [provider]  Active Self, Pharmacy Records           Med Note Orvan Seen, Sharlette Dense   Fri Jun 17, 2021  8:29 AM) #60 filled 05/16/21 per bottle (approx 25 tablets left)  diazepam (VALIUM) 5 MG tablet [id number]  Take 5 mg by mouth at bedtime. [provider]  Active Self, Pharmacy Records           Med Note Orvan Seen, Sharlette Dense   Fri Jun 17, 2021  8:16 AM) #30 filled 05/17/21 per bottle (#13 remaining)  diclofenac (VOLTAREN) 75 MG EC tablet [id number]  Take 75 mg by mouth 2 (two) times daily. [provider]  Active Self, Pharmacy Records           Med Note Orvan Seen, Sharlette Dense   Fri Jun 17, 2021  8:22 AM) #60 filled 05/15/21 per bottle (approx 25 tablets left)  diclofenac Sodium (VOLTAREN)  1 % GEL [id number]  Apply 4 g topically 4 (four) times daily as needed (pain).  Patient taking differently: Apply 1 application. topically 3 (three) times daily.   Tiffany Rakes, MD  Active Self, Pharmacy Records  dicyclomine (BENTYL) 10 MG capsule [id number]  Take 1 capsule (10 mg total) by mouth 3 (three) times daily before meals. Tiffany Rakes, MD  Active   DULoxetine (CYMBALTA) 30 MG capsule [id number]  Take 30 mg by mouth every morning. [provider]  Active Self, Pharmacy Records           Med Note Orvan Seen, Sharlette Dense   Fri Jun 17, 2021  8:23 AM) #30 filled 04/18/21 per bottle (#1 left)  escitalopram (LEXAPRO) 20 MG tablet [id number]  Take 20 mg by mouth every morning. [provider]  Active Self, Pharmacy Records           Med Note Orvan Seen,  Sharlette Dense   Fri Jun 17, 2021  8:29 AM) #30 filled 05/17/21 per bottle (approx 16 tablets left)  febuxostat (ULORIC) 40 MG tablet [id number]  Take 40 mg by mouth every morning. [provider]  Active Self, Pharmacy Records           Med Note Orvan Seen, Sharlette Dense   Fri Jun 17, 2021  8:21 AM) #40 filled 05/07/21 per bottle (#8 left)  Fluticasone-Umeclidin-Vilant (TRELEGY ELLIPTA) 100-62.5-25 MCG/ACT AEPB [id number]  Inhale 1 puff into the lungs daily. Tiffany Rakes, MD  Active   Iron, Ferrous Sulfate, 325 (65 Fe) MG TABS [id number]  Take 325 mg by mouth daily. Tiffany Rakes, MD  Active   loperamide (IMODIUM) 2 MG capsule [id number]  Take 2 mg by mouth every 4 (four) hours as needed for diarrhea or loose stools.  Patient not taking: Reported on 06/17/2021   [provider]  Active Self, Pharmacy Records           Med Note Orvan Seen, Sharlette Dense   Fri Jun 17, 2021  8:20 AM) #24 filled 05/18/2021 per empty bottle - pt states that she ran out on Monday  montelukast (SINGULAIR) 10 MG tablet [id number]  Take 1 tablet (10 mg total) by mouth at bedtime. Mercy Riding, MD  Active Self, Pharmacy Records           Med Note Orvan Seen, Sharlette Dense   Fri Jun 17, 2021  8:27 AM) #30 filled 04/12/21 per bottle (approx 20 tablets left)  omeprazole (PRILOSEC) 40 MG capsule [id number]  Take 1 capsule (40 mg total) by mouth every morning. Tiffany Rakes, MD  Active   oxybutynin (DITROPAN) 5 MG tablet [id number]  Take 1 tablet (5 mg total) by mouth 2 (two) times daily. Tiffany Rakes, MD  Active   oxyCODONE (OXY IR/ROXICODONE) 5 MG immediate release tablet [id number]  Take 5 mg by mouth every 4 (four) hours as needed for severe pain or moderate pain.  Patient not taking: Reported on 06/17/2021   [provider]  Active Self, Pharmacy Records           Med Note Orvan Seen, Sharlette Dense   Fri Jun 17, 2021  8:18 AM) #20 filled 06/02/21 per bottle (bottle is empty)  potassium chloride SA (KLOR-CON M) 20 MEQ tablet [id number]  Take  1 tablet (20 mEq total) by mouth daily. Tiffany Rakes, MD  Active   prazosin (MINIPRESS) 2 MG capsule [id number]  Take 1 capsule (2 mg total) by mouth at bedtime. TAKE 1 CAPSULE(2 MG) BY MOUTH AT BEDTIME Strength: 2 mg Newlin,  Enobong, MD  Active   pregabalin (LYRICA) 75 MG capsule [id number]  Take 75 mg by mouth 2 (two) times daily. [provider]  Active Self, Pharmacy Records           Med Note Orvan Seen, Sharlette Dense   Fri Jun 17, 2021  8:24 AM) #60 filled 05/15/21 per bottle (#24 left)  terbinafine (LAMISIL AT) 1 % cream [id number]  Apply 1 application  topically 2 (two) times daily. Tiffany Rakes, MD  Active   thiamine 100 MG tablet [id number]  Take 1 tablet (100 mg total) by mouth daily.  Patient not taking: Reported on 06/17/2021   Modena Jansky, MD  Active Self, Pharmacy Records           Med Note Orvan Seen, Sharlette Dense   Fri Jun 17, 2021  8:43 AM) Not seen in bag with pt's other medications   torsemide (DEMADEX) 20 MG tablet [id number]  Take 1 tablet (20 mg total) by mouth daily. Tiffany Rakes, MD  Active   Med List Note Latina Craver, [name] 05/19/20 1422): ,             SDOH:  (Social Determinants of Health) assessments and interventions performed:  SDOH Interventions    Flowsheet Row Most Recent Value  SDOH Interventions   Depression Interventions/Treatment  Medication, Counseling        Care Plan  Review of patient past medical history, allergies, medications, health status, including review of consultants reports, laboratory and other test data, was performed as part of comprehensive evaluation for care management services.   Care Plan : [name] Plan of Care (Adult)  Updates made by [name], RN since 06/24/2021 12:00 AM     Problem: CHL AMB "PATIENT-SPECIFIC PROBLEM"      Long-Range Goal: Patient Stated   Start Date: 06/23/2021  Expected End Date: 06/24/2022  Priority: High  Note:   Current Barriers:  Knowledge Deficits related to plan of care for  management of CHF, COPD, and Bipolar Disorder  Care Coordination needs related to Limited social support, Transportation, Limited access to food, Level of care concerns, ADL IADL limitations, Mental Health Concerns , and Inability to perform ADL's independently Lacks caregiver support Literacy barriers Transportation barriers  RNCM Clinical Goal(s):  Patient will verbalize understanding of plan for management of CHF, COPD, and Bipolar Disorder as evidenced by Patient verbalizing adequate plan of care continue to work with RN Care Manager to address care management and care coordination needs related to  CHF, COPD, and Bipolar Disorder as evidenced by adherence to CM Team Scheduled appointments work with pharmacist to address Limited social support, Transportation, Limited access to food, Level of care concerns, and ADL IADL limitations related toCHF, COPD, and Bipolar Disorder as evidenced by review or EMR and patient or pharmacist report work with Education officer, museum to address  related to the management of Limited social support, Transportation, Limited access to food, Level of care concerns, and ADL IADL limitations related to the management of CHF, COPD, and Bipolar Disorder as evidenced by review of EMR and patient or Education officer, museum report work with [employer] care guide to address needs related to  Limited social support, Transportation, Limited access to food, Level of care concerns, and ADL IADL limitations as evidenced by patient and/or community resource care guide support work with [place] to establish in home primary care as evidenced by reported engagement by [employer] not experience [name] admission as evidenced by review of EMR. [name] Admissions in  last 6 months = 5 experience decrease in ED visits as evidenced by EMR review.  ED visits in in last 6 months = 20  through collaboration with RN Care manager, provider, and care team.   Interventions: Inter-disciplinary  care team collaboration (see longitudinal plan of care) Evaluation of current treatment plan related to  self management and patient's adherence to plan as established by provider   Heart Failure Interventions:  (Status:  New goal.) Long Term Goal Provided education on low sodium diet Reviewed Heart Failure Action Plan in depth and provided written copy  COPD Interventions:  (Status:  New goal.) Long Term Goal Provided patient with basic written and verbal COPD education on self care/management/and exacerbation prevention Provided written and verbal instructions on pursed lip breathing and utilized returned demonstration as teach back  Patient Goals/Self-Care Activities: Take all medications as prescribed Attend all scheduled provider appointments keep legs up while sitting use salt in moderation develop a rescue plan eliminate smoking in my home develop a rescue plan follow rescue plan if symptoms flare-up  Follow Up Plan:  The patient has been provided with contact information for the care management team and has been advised to call with any health related questions or concerns.        Plan:  Telephone follow up appointment with care management team member scheduled for:  1 week The patient has been provided with contact information for the care management team and has been advised to call with any health related questions or concerns.  Will place referral to pharmacy team for medication reconciliation.  Will place referral to Care Guide for food and transportation resources.  Will place referral to [facility] for in home provider services.  Will continue to collaborate with [name] and [facility] team. Will provide education for CHF and COPD management.  [name], RN, MSN, Bee Manager [phone]

## 2021-06-23 NOTE — Telephone Encounter (Signed)
Transition Care Management Follow-up Telephone Call Date of discharge and from where: 06/22/2021, [facility]  How have you been since you were released from the hospital? She said she is "not feeling too good just yet."  Any questions or concerns? Yes - she does not have all of her medications as noted below. She would like PCS  Items Reviewed: Did the pt receive and understand the discharge instructions provided? Yes - she said she understands them  Medications obtained and verified? No - medication list reviewed and she said she knows what she needs to take and and does not have of the following medications at home and needs refills for all of them: albuterol inhaler, flexeril, dicyclomine, duloxetine, uloric, ferrous sulfate, oxybutynin, potassium chloride, prazosin, pregabalin, terbenafine cream, torsemide, trelegy ellipta.  Other? No  Any new allergies since your discharge? No  Dietary orders reviewed? No Do you have support at home?  [name] alone and said that her mother and sister come" in and out" to check on her/help her   Home Care and Equipment/Supplies: Were home health services ordered? yes If so, what is the name of the agency? [facility]  Has the agency set up a time to come to the patient's home? No, not yet Were any new equipment or medical supplies ordered?  No What is the name of the medical supply agency? N/a Were you able to get the supplies/equipment? not applicable Do you have any questions related to the use of the equipment or supplies? No  Functional Questionnaire: (I = Independent and D = Dependent) ADLs: dependent. Uses a sliding board for transfers. Has a bedside commode and electric wheelchair. She said she is not ambulating. She relies on her mother and sister to meet her needs. [name] dependent on them to bring her food and she said they don't bring food every day. She said she manages her own medications and does not need anyone to help with that.     Follow up appointments reviewed:  PCP Hospital f/u appt confirmed? Yes  Scheduled to see Dr Margarita Rana- 06/30/2021 @ [facility] f/u appt confirmed?  None scheduled at this time   Are transportation arrangements needed? Yes - she is currently using an electric wheelchair and does not have a ramp at her home. I explained to her that I will check with [facility] about transportation options.  If their condition worsens, is the pt aware to call PCP or go to the Emergency Dept.? Yes Was the patient provided with contact information for the PCP's office or ED? Yes Was to pt encouraged to call back with questions or concerns? Yes

## 2021-06-24 ENCOUNTER — Telehealth: Payer: Self-pay

## 2021-06-24 ENCOUNTER — Other Ambulatory Visit: Payer: Self-pay

## 2021-06-24 ENCOUNTER — Encounter: Payer: Self-pay | Admitting: *Deleted

## 2021-06-24 MED ORDER — OMEPRAZOLE 40 MG PO CPDR: 40.0000 mg | DELAYED_RELEASE_CAPSULE | Freq: Every morning | ORAL | 6 refills | Status: DC

## 2021-06-24 MED ORDER — ATORVASTATIN CALCIUM 20 MG PO TABS
20.0000 mg | ORAL_TABLET | Freq: Every day | ORAL | 1 refills | Status: DC
Start: 2021-06-24 — End: 2022-04-03

## 2021-06-24 MED ORDER — PRAZOSIN HCL 2 MG PO CAPS: 2.0000 mg | ORAL_CAPSULE | Freq: Every day | ORAL | 6 refills | Status: DC

## 2021-06-24 MED ORDER — TRELEGY ELLIPTA 100-62.5-25 MCG/ACT IN AEPB: 1.0000 | INHALATION_SPRAY | Freq: Every day | RESPIRATORY_TRACT | 6 refills | Status: DC

## 2021-06-24 MED ORDER — IRON (FERROUS SULFATE) 325 (65 FE) MG PO TABS: 325.0000 mg | ORAL_TABLET | Freq: Every day | ORAL | 2 refills | Status: DC

## 2021-06-24 MED ORDER — POTASSIUM CHLORIDE CRYS ER 20 MEQ PO TBCR: 20.0000 meq | EXTENDED_RELEASE_TABLET | Freq: Every day | ORAL | 6 refills | Status: DC

## 2021-06-24 MED ORDER — AMLODIPINE BESYLATE 5 MG PO TABS: 5.0000 mg | ORAL_TABLET | Freq: Every day | ORAL | 6 refills | Status: DC

## 2021-06-24 MED ORDER — COLCHICINE 0.6 MG PO TABS: ORAL_TABLET | ORAL | 1 refills | Status: DC

## 2021-06-24 MED ORDER — ATORVASTATIN CALCIUM 20 MG PO TABS: 20.0000 mg | ORAL_TABLET | Freq: Every day | ORAL | 1 refills | Status: DC

## 2021-06-24 MED ORDER — DICYCLOMINE HCL 10 MG PO CAPS: 10.0000 mg | ORAL_CAPSULE | Freq: Three times a day (TID) | ORAL | 6 refills | Status: DC

## 2021-06-24 MED ORDER — CARVEDILOL 6.25 MG PO TABS: 6.2500 mg | ORAL_TABLET | Freq: Two times a day (BID) | ORAL | 3 refills | Status: DC

## 2021-06-24 MED ORDER — TORSEMIDE 20 MG PO TABS: 20.0000 mg | ORAL_TABLET | Freq: Every day | ORAL | 6 refills | Status: DC

## 2021-06-24 MED ORDER — OXYBUTYNIN CHLORIDE 5 MG PO TABS: 5.0000 mg | ORAL_TABLET | Freq: Two times a day (BID) | ORAL | 5 refills | Status: DC

## 2021-06-24 MED ORDER — TERBINAFINE HCL 1 % EX CREA: 1.0000 "application " | TOPICAL_CREAM | Freq: Two times a day (BID) | CUTANEOUS | 1 refills | Status: DC

## 2021-06-24 NOTE — Telephone Encounter (Signed)
   Telephone encounter was:  Successful.  06/24/2021 Name: RASHIDAH BELLEVILLE MRN: [id number] DOB: 07/16/1960  [name] is a 61 y.o. year old female who is a primary care patient of [name], MD . The community resource team was consulted for assistance with Food Insecurity and Financial Difficulties related to rent  Care guide performed the following interventions: Patient provided with information about care guide support team and interviewed to confirm resource needs. Food resources and assistance with rent is all that is needed per pt at this time.  Follow Up Plan:  Care guide will outreach resources to assist patient with needs above.  [place] management  [place], [place]  Main Phone: [phone]  E-mail: [email or web address]  Website: [email or web address]

## 2021-06-24 NOTE — Telephone Encounter (Signed)
Medications have been refilled ?

## 2021-06-26 ENCOUNTER — Emergency Department (HOSPITAL_COMMUNITY)
Admission: EM | Admit: 2021-06-26 | Discharge: 2021-06-27 | Disposition: A | Discharge status: Home / Self Care | Payer: Medicare Other | Attending: Emergency Medicine | Admitting: Emergency Medicine

## 2021-06-26 ENCOUNTER — Emergency Department (HOSPITAL_COMMUNITY): Payer: Medicare Other

## 2021-06-26 ENCOUNTER — Other Ambulatory Visit: Payer: Self-pay

## 2021-06-26 ENCOUNTER — Encounter (HOSPITAL_COMMUNITY): Payer: Self-pay | Admitting: Emergency Medicine

## 2021-06-26 DIAGNOSIS — F259 Schizoaffective disorder, unspecified: Secondary | ICD-10-CM | POA: Diagnosis not present

## 2021-06-26 DIAGNOSIS — F250 Schizoaffective disorder, bipolar type: Secondary | ICD-10-CM | POA: Insufficient documentation

## 2021-06-26 DIAGNOSIS — E119 Type 2 diabetes mellitus without complications: Secondary | ICD-10-CM | POA: Diagnosis not present

## 2021-06-26 DIAGNOSIS — I10 Essential (primary) hypertension: Secondary | ICD-10-CM | POA: Diagnosis not present

## 2021-06-26 DIAGNOSIS — I2510 Atherosclerotic heart disease of native coronary artery without angina pectoris: Secondary | ICD-10-CM | POA: Diagnosis not present

## 2021-06-26 DIAGNOSIS — Z79899 Other long term (current) drug therapy: Secondary | ICD-10-CM | POA: Diagnosis not present

## 2021-06-26 DIAGNOSIS — R410 Disorientation, unspecified: Secondary | ICD-10-CM | POA: Diagnosis not present

## 2021-06-26 DIAGNOSIS — F22 Delusional disorders: Secondary | ICD-10-CM

## 2021-06-26 DIAGNOSIS — R9431 Abnormal electrocardiogram [ECG] [EKG]: Secondary | ICD-10-CM | POA: Diagnosis not present

## 2021-06-26 DIAGNOSIS — R4182 Altered mental status, unspecified: Secondary | ICD-10-CM | POA: Diagnosis present

## 2021-06-26 DIAGNOSIS — R197 Diarrhea, unspecified: Secondary | ICD-10-CM | POA: Diagnosis not present

## 2021-06-26 DIAGNOSIS — R441 Visual hallucinations: Secondary | ICD-10-CM | POA: Insufficient documentation

## 2021-06-26 HISTORY — DX: Delusional disorders: F22

## 2021-06-26 LAB — CBC WITH DIFFERENTIAL/PLATELET
Abs Immature Granulocytes: 0.02 10*3/uL (ref 0.00–0.07)
Basophils Absolute: 0.1 10*3/uL (ref 0.0–0.1)
Basophils Relative: 1 %
Eosinophils Absolute: 0.3 10*3/uL (ref 0.0–0.5)
Eosinophils Relative: 4 %
HCT: 35.2 % — ABNORMAL LOW (ref 36.0–46.0)
Hemoglobin: 10.7 g/dL — ABNORMAL LOW (ref 12.0–15.0)
Immature Granulocytes: 0 %
Lymphocytes Relative: 32 %
Lymphs Abs: 2 10*3/uL (ref 0.7–4.0)
MCH: 27.4 pg (ref 26.0–34.0)
MCHC: 30.4 g/dL (ref 30.0–36.0)
MCV: 90.3 fL (ref 80.0–100.0)
Monocytes Absolute: 0.4 10*3/uL (ref 0.1–1.0)
Monocytes Relative: 7 %
Neutro Abs: 3.5 10*3/uL (ref 1.7–7.7)
Neutrophils Relative %: 56 %
Platelets: 373 10*3/uL (ref 150–400)
RBC: 3.9 MIL/uL (ref 3.87–5.11)
RDW: 15.5 % (ref 11.5–15.5)
WBC: 6.2 10*3/uL (ref 4.0–10.5)
nRBC: 0 % (ref 0.0–0.2)

## 2021-06-26 LAB — URINALYSIS, ROUTINE W REFLEX MICROSCOPIC
Bilirubin Urine: NEGATIVE
Glucose, UA: NEGATIVE mg/dL
Hgb urine dipstick: NEGATIVE
Ketones, ur: NEGATIVE mg/dL
Nitrite: NEGATIVE
Protein, ur: 30 mg/dL — AB
Specific Gravity, Urine: 1.008 (ref 1.005–1.030)
pH: 6 (ref 5.0–8.0)

## 2021-06-26 LAB — BASIC METABOLIC PANEL
Anion gap: 6 (ref 5–15)
BUN: 21 mg/dL (ref 8–23)
CO2: 25 mmol/L (ref 22–32)
Calcium: 9.6 mg/dL (ref 8.9–10.3)
Chloride: 108 mmol/L (ref 98–111)
Creatinine, Ser: 1.57 mg/dL — ABNORMAL HIGH (ref 0.44–1.00)
GFR, Estimated: 37 mL/min — ABNORMAL LOW (ref >60)
Glucose, Bld: 82 mg/dL (ref 70–99)
Potassium: 4 mmol/L (ref 3.5–5.1)
Sodium: 139 mmol/L (ref 135–145)

## 2021-06-26 LAB — BLOOD GAS, VENOUS
Acid-Base Excess: 0.7 mmol/L (ref 0.0–2.0)
Bicarbonate: 26.6 mmol/L (ref 20.0–28.0)
O2 Saturation: 46.2 %
Patient temperature: 37
pCO2, Ven: 47 mmHg (ref 44–60)
pH, Ven: 7.36 (ref 7.25–7.43)
pO2, Ven: <31 mmHg — CL (ref 32–45)

## 2021-06-26 LAB — RAPID URINE DRUG SCREEN, HOSP PERFORMED
Amphetamines: NOT DETECTED
Barbiturates: NOT DETECTED
Benzodiazepines: POSITIVE — AB
Cocaine: NOT DETECTED
Opiates: NOT DETECTED
Tetrahydrocannabinol: NOT DETECTED

## 2021-06-26 LAB — MAGNESIUM: Magnesium: 2.1 mg/dL (ref 1.7–2.4)

## 2021-06-26 MED ORDER — TORSEMIDE 20 MG PO TABS
20.0000 mg | ORAL_TABLET | Freq: Every day | ORAL | Status: DC
  Administered 2021-06-26: 20 mg via ORAL
  Filled 2021-06-26: qty 1

## 2021-06-26 MED ORDER — FLUTICASONE FUROATE-VILANTEROL 100-25 MCG/ACT IN AEPB
1.0000 | INHALATION_SPRAY | Freq: Every day | RESPIRATORY_TRACT | Status: DC
  Filled 2021-06-26: qty 28

## 2021-06-26 MED ORDER — FERROUS SULFATE 325 (65 FE) MG PO TABS: 325.0000 mg | ORAL_TABLET | Freq: Every day | ORAL | Status: DC

## 2021-06-26 MED ORDER — MONTELUKAST SODIUM 10 MG PO TABS
10.0000 mg | ORAL_TABLET | Freq: Every day | ORAL | Status: DC
  Administered 2021-06-26: 10 mg via ORAL
  Filled 2021-06-26: qty 1

## 2021-06-26 MED ORDER — CARVEDILOL 3.125 MG PO TABS
6.2500 mg | ORAL_TABLET | Freq: Two times a day (BID) | ORAL | Status: DC
  Filled 2021-06-26: qty 2

## 2021-06-26 MED ORDER — FEBUXOSTAT 40 MG PO TABS: 40.0000 mg | ORAL_TABLET | Freq: Every morning | ORAL | Status: DC

## 2021-06-26 MED ORDER — FENTANYL CITRATE PF 50 MCG/ML IJ SOSY
25.0000 ug | PREFILLED_SYRINGE | Freq: Once | INTRAMUSCULAR | Status: AC
  Administered 2021-06-26: 25 ug via INTRAVENOUS
  Filled 2021-06-26: qty 1

## 2021-06-26 MED ORDER — ATORVASTATIN CALCIUM 10 MG PO TABS
20.0000 mg | ORAL_TABLET | Freq: Every day | ORAL | Status: DC
  Administered 2021-06-26: 20 mg via ORAL
  Filled 2021-06-26: qty 2

## 2021-06-26 MED ORDER — SODIUM CHLORIDE 0.9 % IV BOLUS
500.0000 mL | Freq: Once | INTRAVENOUS | Status: AC
  Administered 2021-06-26: 500 mL via INTRAVENOUS

## 2021-06-26 MED ORDER — ACETAMINOPHEN 325 MG PO TABS
650.0000 mg | ORAL_TABLET | Freq: Once | ORAL | Status: AC
Start: 2021-06-26 — End: 2021-06-26
  Administered 2021-06-26: 650 mg via ORAL
  Filled 2021-06-26: qty 2

## 2021-06-26 MED ORDER — ALBUTEROL SULFATE (2.5 MG/3ML) 0.083% IN NEBU
2.5000 mg | INHALATION_SOLUTION | Freq: Four times a day (QID) | RESPIRATORY_TRACT | Status: DC | PRN
Start: 2021-06-26 — End: 2021-06-27

## 2021-06-26 MED ORDER — AMLODIPINE BESYLATE 5 MG PO TABS
5.0000 mg | ORAL_TABLET | Freq: Every day | ORAL | Status: DC
  Administered 2021-06-26: 5 mg via ORAL
  Filled 2021-06-26: qty 1

## 2021-06-26 MED ORDER — PRAZOSIN HCL 1 MG PO CAPS
2.0000 mg | ORAL_CAPSULE | Freq: Every day | ORAL | Status: DC
  Administered 2021-06-26: 2 mg via ORAL
  Filled 2021-06-26: qty 2

## 2021-06-26 MED ORDER — UMECLIDINIUM BROMIDE 62.5 MCG/ACT IN AEPB
1.0000 | INHALATION_SPRAY | Freq: Every day | RESPIRATORY_TRACT | Status: DC
  Filled 2021-06-26: qty 7

## 2021-06-26 MED ORDER — ALBUTEROL SULFATE HFA 108 (90 BASE) MCG/ACT IN AERS
2.0000 | INHALATION_SPRAY | Freq: Four times a day (QID) | RESPIRATORY_TRACT | Status: DC | PRN
Start: 2021-06-26 — End: 2021-06-26

## 2021-06-26 MED ORDER — PANTOPRAZOLE SODIUM 40 MG PO TBEC
40.0000 mg | DELAYED_RELEASE_TABLET | Freq: Every day | ORAL | Status: DC
  Administered 2021-06-26: 40 mg via ORAL
  Filled 2021-06-26: qty 1

## 2021-06-26 NOTE — ED Triage Notes (Signed)
BIBA Per EMS: Pt coming from home w/ c/o diarrhea & parasites in her body. Behavioral issue. Pt reports her mother is putting bugs in her food.  VSS

## 2021-06-26 NOTE — Consult Note (Signed)
[facility] ED ASSESSMENT   Reason for Consult:  Psychiatry Consult Referring Physician:  ER Physician Patient Identification: Tiffany [name] MRN:  [id number] ED Chief Complaint: <principal problem not specified>  Diagnosis:  Active Problems:   Delusional disorder, persecutory type [facility])   ED Assessment Time Calculation: Start Time: 5956 Stop Time: 1805 Total Time in Minutes (Assessment Completion): 35   Subjective:   Tiffany [name] is a 61 y.o. female patient admitted with previous hx Delusional disorder .of Persecutory type of accusing her mother of causing her diarrhea and mother putting bugs in her food.  HPI:  61 y.o.female patient admitted with previous hx Delusional disorder .of Persecutory type of accusing her mother of causing her diarrhea and mother putting bugs in her food. Patient was seen in the ER where she was resting.  She engaged in meaningful conversation with provider.  She reported that she left the hospital two days ago after a long stay.  She reported that she did not have diarrhea while in the hospital until she went home.  Now she is back with Diarrhea she says.  She also stated that her elderly mother did not want her to go to her room but when she finally went into her room she saw bugs everywhere.  Then she believes that her mother is putting the bugs in her food because she saw bugs in her food.  Efforts to explain to patient that if she was taking antibiotic long term in the hospital that could cause diarrhea failed.  Patient reported that her mother is too old to cook for her, she reported that her mother may have dementia as well.  Patient denied SI/HI/AVH but remains paranoid.  We will resume her Saphris 10 mg twice a day for delusion.  Patient denied feeling depressed but stated that she is sad her mother is trying to poison her with bugs and causing her to have loose stools.  We will reevaluate in am as we may not be able to obtain Geropsych unit bed easily for her due  multiple medical  need involving ambulation and self care..  Past Psychiatric History: per past record-H/O Schizoaffective disorder, bipolar type, chronic PTSD, binge eating disorder.  Patient is currently prescribed Valium 5 mg p.o. 3 times daily, asenapine 10 mg p.o. twice daily, Lexapro 10 mg p.o. daily.  Was previously receiving services through [facility] outpatient, Dr. Montel Culver.  Recently started at [facility] counseling on January 04, 2021 has not seen psychiatric provider. H/O multiple psychiatric hospitalization due to drug use and mental illness.  In the past she had tried Abilify, Risperdal, Wellbutrin, Xanax, Klonopin, trazodone and BuSpar.  H/O paranoia, delusion, anxiety, manic-like symptoms.  She used to take Geodon 180 mg but does reduced due to high QTC interval.  Currently she is taking Saphris 10 mg po bid sublingual.  Risk to Self or Others: Is the patient at risk to self? No Has the patient been a risk to self in the past 6 months? No Has the patient been a risk to self within the distant past? No Is the patient a risk to others? No Has the patient been a risk to others in the past 6 months? No Has the patient been a risk to others within the distant past? No  [place] Scale:  [place] ED from 06/26/2021 in [place] DEPT ED to Hosp-Admission (Discharged) from 06/16/2021 in [place] ED from 06/13/2021 in [place] DEPT  C-SSRS RISK CATEGORY No Risk No Risk No Risk       AIMS:  , , ,  ,   ASAM:    Substance Abuse:     Past Medical History:  Past Medical History:  Diagnosis Date   Agitation 11/22/2017   Anoxic brain injury ([place]) 09/08/2016   C. Arrest due to respiratory failure and COPD exacerbation   Anxiety    Arthritis    "all over" (04/10/2016)   Asthma 10/18/2010   Binge eating disorder    Cardiac arrest ([place]) 09/08/2016   PEA   Carotid artery stenosis     1-39% bilateral by dopplers 11/2016   Chronic diastolic (congestive) heart failure (HCC)    Chronic kidney disease, stage 3 (HCC)    Chronic pain syndrome 06/18/2012   Chronic post-traumatic stress disorder (PTSD) 05/27/2018   Chronic respiratory failure with hypoxia and hypercapnia ([place]) 06/22/2015   TRILOGY Vent >AVAPA-ES., Vt target 200-400, Max P 30 , PS max 20 , PS min 6-10 , E Max 6, E Min 4, Rate Auto AVAPS Rate 2 (titrate for pt comfort) , bleed O2 at 5l/m continuous flow .    Closed displaced fracture of fifth metacarpal bone 03/21/2018   Cocaine use disorder, severe, in sustained remission ([place]) 41/28/7867   Complication of anesthesia    decreased bp, decreased heart rate   COPD (chronic obstructive pulmonary disease) ([place]) 07/08/2014   Depression    Diabetic neuropathy ([place]) 04/24/2011   Difficulty with speech 01/24/2018   Disorder of nervous system    Drug abuse ([place]) 11/21/2017   Dyslipidemia 04/24/2011   Elevated troponin 04/28/2012   Emphysema    Encephalopathy 11/21/2017   Essential hypertension 03/22/2016   Fibula fracture 07/10/2016   Frequent falls 10/11/2017   GERD (gastroesophageal reflux disease)    Gout 04/11/2017   Heart attack ([place]) 1980s   History of blood transfusion 1994   "couldn't stop bleeding from my period"   History of drug abuse in remission ([place]) 11/28/2015   Quit in 2017   Hyperlipidemia LDL goal <70    Incontinence    Manic depression ([place])    Morbid obesity ([place]) 10/18/2010   Obstructive sleep apnea 10/18/2010   On home oxygen therapy    "6L; 24/7" (04/10/2016)   OSA on CPAP    "wear mask sometimes" (04/10/2016)   Paranoid ([place])    "sometimes; I'm on RX for it" (04/10/2016)   Prolonged Q-T interval on ECG    Rectal bleeding 12/31/2015   Schizoaffective disorder, bipolar type ([place]) 04/05/2018   Seasonal allergies    Seborrheic keratoses 12/31/2013   Seizures ([place])    "don't know what kind; last one was ~ 1 yr ago" (04/10/2016)   Sinus  bradycardia    Stroke [facility]) 1980s   denies residual on 04/10/2016   Thrush 09/19/2013   Type 2 diabetes mellitus ([place]) 10/18/2010    Past Surgical History:  Procedure Laterality Date   CESAREAN SECTION  1997   COLONOSCOPY WITH PROPOFOL N/A 04/01/2021   Procedure: COLONOSCOPY WITH PROPOFOL;  Surgeon: Carol Ada, MD;  Location: [facility];  Service: Gastroenterology;  Laterality: N/A;  Rectal bleeding with drop in hemoglobin to 7.2 g/dL   HERNIA REPAIR     IR CHOLANGIOGRAM EXISTING TUBE  07/20/2016   IR PERC CHOLECYSTOSTOMY  05/10/2016   IR RADIOLOGIST EVAL & MGMT  06/08/2016   IR RADIOLOGIST EVAL & MGMT  06/29/2016   IR SINUS/FIST TUBE CHK-NON GI  07/12/2016  RIGHT/LEFT HEART CATH AND CORONARY ANGIOGRAPHY N/A 06/19/2017   Procedure: RIGHT/LEFT HEART CATH AND CORONARY ANGIOGRAPHY;  Surgeon: Jolaine Artist, MD;  Location: [place] CV LAB;  Service: Cardiovascular;  Laterality: N/A;   TIBIA IM NAIL INSERTION Right 07/12/2016   Procedure: INTRAMEDULLARY (IM) NAIL RIGHT TIBIA;  Surgeon: Leandrew Koyanagi, MD;  Location: [place];  Service: Orthopedics;  Laterality: Right;   UMBILICAL HERNIA REPAIR  ~ 1963   "that's why I don't have a belly button"   VAGINAL HYSTERECTOMY     Family History:  Family History  Problem Relation Age of Onset   Cancer Mother        lung   Depression Mother    Cancer Father        prostate   Depression Sister    Anxiety disorder Sister    Schizophrenia Sister    Bipolar disorder Sister    Depression Sister    Depression Brother    Heart failure Other        cousin   Family Psychiatric  History: unknown Social History:  Social History   Substance and Sexual Activity  Alcohol Use No   Alcohol/week: 0.0 standard drinks of alcohol     Social History   Substance and Sexual Activity  Drug Use Not Currently   Types: Cocaine   Comment: 04/10/2016 "last used cocaine back in November 2017"    Social History   Socioeconomic History   Marital status:  Widowed    Spouse name: Not on file   Number of children: 3   Years of education: Not on file   Highest education level: Not on file  Occupational History   Occupation: disabled    Comment: factory production  Tobacco Use   Smoking status: Former    Packs/day: 1.50    Years: 38.00    Total pack years: 57.00    Types: Cigarettes    Start date: 03/13/1977    Quit date: 04/10/2016    Years since quitting: 5.2   Smokeless tobacco: Never  Vaping Use   Vaping Use: Never used  Substance and Sexual Activity   Alcohol use: No    Alcohol/week: 0.0 standard drinks of alcohol   Drug use: Not Currently    Types: Cocaine    Comment: 04/10/2016 "last used cocaine back in November 2017"   Sexual activity: Not Currently    Birth control/protection: Surgical  Other Topics Concern   Not on file  Social History Narrative   Has 1 son, [name]   Lives with son and his boyfriend   Her house has ramps and handrails should she ever needs them.    Her mother lives down the street from her and is a good support person in addition to her son.   She drives herself, has private transportation.    Cocaine free since 02/24/16, smoke free since 04/10/16   Social Determinants of Health   Financial Resource Strain: Not on file  Food Insecurity: Food Insecurity Present (06/23/2021)   Hunger Vital Sign    Worried About Running Out of Food in the Last Year: Never true    Ran Out of Food in the Last Year: Sometimes true  Transportation Needs: Unmet Transportation Needs (06/23/2021)   PRAPARE - Hydrologist (Medical): Yes    Lack of Transportation (Non-Medical): Yes  Physical Activity: Not on file  Stress: Not on file  Social Connections: Not on file   Additional Social History:  Allergies:   Allergies  Allergen Reactions   Hydrocodone Shortness Of Breath   Latuda [Lurasidone Hcl] Anaphylaxis   Magnesium-Containing Compounds Anaphylaxis    Tolerated Ensure   Prednisone  Anaphylaxis, Swelling and Other (See Comments)    Tongue swelling, lip swelling, throat swelling, per pt    Tramadol Anaphylaxis and Swelling   Codeine Nausea And Vomiting   Trazodone Other (See Comments)    paranoia   Topamax [Topiramate] Other (See Comments)    Increases paranoia   Sulfa Antibiotics Itching   Tape Rash    Labs:  Results for orders placed or performed during the hospital encounter of 06/26/21 (from the past 48 hour(s))  CBC with Differential     Status: Abnormal   Collection Time: 06/26/21  7:39 AM  Result Value Ref Range   WBC 6.2 4.0 - 10.5 K/uL   RBC 3.90 3.87 - 5.11 MIL/uL   Hemoglobin 10.7 (L) 12.0 - 15.0 g/dL   HCT 35.2 (L) 36.0 - 46.0 %   MCV 90.3 80.0 - 100.0 fL   MCH 27.4 26.0 - 34.0 pg   MCHC 30.4 30.0 - 36.0 g/dL   RDW 15.5 11.5 - 15.5 %   Platelets 373 150 - 400 K/uL   nRBC 0.0 0.0 - 0.2 %   Neutrophils Relative % 56 %   Neutro Abs 3.5 1.7 - 7.7 K/uL   Lymphocytes Relative 32 %   Lymphs Abs 2.0 0.7 - 4.0 K/uL   Monocytes Relative 7 %   Monocytes Absolute 0.4 0.1 - 1.0 K/uL   Eosinophils Relative 4 %   Eosinophils Absolute 0.3 0.0 - 0.5 K/uL   Basophils Relative 1 %   Basophils Absolute 0.1 0.0 - 0.1 K/uL   Immature Granulocytes 0 %   Abs Immature Granulocytes 0.02 0.00 - 0.07 K/uL    Comment: Performed at [facility], [place] [address], [place], [place] [address]  Basic metabolic panel     Status: Abnormal   Collection Time: 06/26/21  7:39 AM  Result Value Ref Range   Sodium 139 135 - 145 mmol/L   Potassium 4.0 3.5 - 5.1 mmol/L   Chloride 108 98 - 111 mmol/L   CO2 25 22 - 32 mmol/L   Glucose, Bld 82 70 - 99 mg/dL    Comment: Glucose reference range applies only to samples taken after fasting for at least 8 hours.   BUN 21 8 - 23 mg/dL   Creatinine, Ser 1.57 (H) 0.44 - 1.00 mg/dL   Calcium 9.6 8.9 - 10.3 mg/dL   GFR, Estimated 37 (L) >60 mL/min    Comment: (NOTE) Calculated using the CKD-EPI Creatinine Equation  (2021)    Anion gap 6 5 - 15    Comment: Performed at [facility], [place] [address] [place], [place] [address]  Magnesium     Status: None   Collection Time: 06/26/21  7:39 AM  Result Value Ref Range   Magnesium 2.1 1.7 - 2.4 mg/dL    Comment: Performed at [facility], [place] [address], [place],  [address]  Blood gas, venous (at [facility] and [facility], not at [facility])     Status: Abnormal   Collection Time: 06/26/21  7:59 AM  Result Value Ref Range   pH, Ven 7.36 7.25 - 7.43   pCO2, Ven 47 44 - 60 mmHg   pO2, Ven <31 (LL) 32 - 45 mmHg    Comment: CRITICAL RESULT CALLED TO, READ BACK BY  AND VERIFIED WITH: Asher Muir RN AT 4081 06/26/21 BY TIB[place]TS,K    Bicarbonate 26.6 20.0 - 28.0 mmol/L   Acid-Base Excess 0.7 0.0 - 2.0 mmol/L   O2 Saturation 46.2 %   Patient temperature 37.0     Comment: Performed at [facility], [place] [address], [place], [place] [address]  Urinalysis, Routine w reflex microscopic Urine, Clean Catch     Status: Abnormal   Collection Time: 06/26/21  9:27 AM  Result Value Ref Range   Color, Urine STRAW (A) YELLOW   APPearance CLEAR CLEAR   Specific Gravity, Urine 1.008 1.005 - 1.030   pH 6.0 5.0 - 8.0   Glucose, UA NEGATIVE NEGATIVE mg/dL   Hgb urine dipstick NEGATIVE NEGATIVE   Bilirubin Urine NEGATIVE NEGATIVE   Ketones, ur NEGATIVE NEGATIVE mg/dL   Protein, ur 30 (A) NEGATIVE mg/dL   Nitrite NEGATIVE NEGATIVE   Leukocytes,Ua MODERATE (A) NEGATIVE   RBC / HPF 0-5 0 - 5 RBC/hpf   WBC, UA 0-5 0 - 5 WBC/hpf   Bacteria, UA RARE (A) NONE SEEN   Squamous Epithelial / LPF 0-5 0 - 5   Mucus PRESENT     Comment: Performed at [facility], [place] [address], [place], [place] [address]  Rapid urine drug screen (hospital performed)     Status: Abnormal   Collection Time: 06/26/21  9:27 AM  Result Value Ref Range   Opiates NONE DETECTED NONE DETECTED   Cocaine NONE DETECTED NONE DETECTED   Benzodiazepines  POSITIVE (A) NONE DETECTED   Amphetamines NONE DETECTED NONE DETECTED   Tetrahydrocannabinol NONE DETECTED NONE DETECTED   Barbiturates NONE DETECTED NONE DETECTED    Comment: (NOTE) DRUG SCREEN FOR MEDICAL PURPOSES ONLY.  IF CONFIRMATION IS NEEDED FOR ANY PURPOSE, NOTIFY LAB WITHIN 5 DAYS.  LOWEST DETECTABLE LIMITS FOR URINE DRUG SCREEN Drug Class                     Cutoff (ng/mL) Amphetamine and metabolites    1000 Barbiturate and metabolites    200 Benzodiazepine                 970 Tricyclics and metabolites     300 Opiates and metabolites        300 Cocaine and metabolites        300 THC                            50 Performed at [facility], [place] [address] [place], [place] [address]    *Note: Due to a large number of results and/or encounters for the requested time period, some results have not been displayed. A complete set of results can be found in Results Review.    No current facility-administered medications for this encounter.   Current Outpatient Medications  Medication Sig Dispense Refill   albuterol (VENTOLIN HFA) 108 (90 Base) MCG/ACT inhaler Inhale 2 puffs into the lungs every 6 (six) hours as needed for wheezing or shortness of breath. 8.5 g 2   amLODipine (NORVASC) 5 MG tablet Take 1 tablet (5 mg total) by mouth at bedtime. 30 tablet 6   Asenapine Maleate 10 MG SUBL Place 1 tablet (10 mg total) under the tongue 2 (two) times daily. 10 tablet 0   atorvastatin (LIPITOR) 20 MG tablet Take 1 tablet (20 mg total) by mouth daily. 90 tablet 1   carvedilol (COREG)  6.25 MG tablet Take 1 tablet (6.25 mg total) by mouth 2 (two) times daily with a meal. 60 tablet 3   colchicine 0.6 MG tablet Take 2 tablets by mouth at the onset of gout flare, may repeat 1 tablet in 1 hour if symptoms persist 30 tablet 1   diazepam (VALIUM) 5 MG tablet Take 5 mg by mouth at bedtime.     diclofenac (VOLTAREN) 75 MG EC tablet Take 75 mg by mouth 2 (two) times daily.      diclofenac Sodium (VOLTAREN) 1 % GEL Apply 4 g topically 4 (four) times daily as needed (pain). (Patient taking differently: Apply 1 application  topically 3 (three) times daily.) 100 g 3   dicyclomine (BENTYL) 10 MG capsule Take 1 capsule (10 mg total) by mouth 3 (three) times daily before meals. 90 capsule 6   DULoxetine (CYMBALTA) 30 MG capsule Take 30 mg by mouth every morning.     escitalopram (LEXAPRO) 20 MG tablet Take 20 mg by mouth every morning.     febuxostat (ULORIC) 40 MG tablet Take 40 mg by mouth every morning.     Fluticasone-Umeclidin-Vilant (TRELEGY ELLIPTA) 100-62.5-25 MCG/ACT AEPB Inhale 1 puff into the lungs daily. 60 each 6   Iron, Ferrous Sulfate, 325 (65 Fe) MG TABS Take 325 mg by mouth daily. 60 tablet 2   methocarbamol (ROBAXIN) 500 MG tablet Take 500 mg by mouth 4 (four) times daily as needed for muscle spasms.     montelukast (SINGULAIR) 10 MG tablet Take 1 tablet (10 mg total) by mouth at bedtime. 30 tablet 6   omeprazole (PRILOSEC) 40 MG capsule Take 1 capsule (40 mg total) by mouth every morning. 30 capsule 6   oxybutynin (DITROPAN) 5 MG tablet Take 1 tablet (5 mg total) by mouth 2 (two) times daily. 60 tablet 5   oxyCODONE (OXY IR/ROXICODONE) 5 MG immediate release tablet Take 5 mg by mouth every 4 (four) hours as needed for severe pain or moderate pain.     potassium chloride SA (KLOR-CON M) 20 MEQ tablet Take 1 tablet (20 mEq total) by mouth daily. 30 tablet 6   prazosin (MINIPRESS) 2 MG capsule Take 1 capsule (2 mg total) by mouth at bedtime. TAKE 1 CAPSULE(2 MG) BY MOUTH AT BEDTIME Strength: 2 mg 30 capsule 6   pregabalin (LYRICA) 75 MG capsule Take 75 mg by mouth 2 (two) times daily.     terbinafine (LAMISIL AT) 1 % cream Apply 1 application  topically 2 (two) times daily. 30 g 1   torsemide (DEMADEX) 20 MG tablet Take 1 tablet (20 mg total) by mouth daily. 30 tablet 6   VYVANSE 10 MG capsule Take 10 mg by mouth every morning.     thiamine 100 MG tablet Take  1 tablet (100 mg total) by mouth daily. (Patient not taking: Reported on 06/17/2021)      Musculoskeletal: Strength & Muscle Tone:  in bed lying down Gait & Station:  in bed lying down Patient leans:  see above   Psychiatric Specialty Exam: Presentation  General Appearance: Casual; Fairly Groomed  Eye Contact:Good  Speech:Clear and Coherent; Normal Rate  Speech Volume:Normal  Handedness:Right   Mood and Affect  Mood:Anxious  Affect:Congruent   Thought Process  Thought Processes:Coherent; Goal Directed  Descriptions of Associations:Intact  Orientation:Full (Time, Place and Person)  Thought Content:Illogical  History of Schizophrenia/Schizoaffective disorder:No  Duration of Psychotic Symptoms:Greater than six months  Hallucinations:Hallucinations: Visual Description of Visual Hallucinations: mom puts bug in  her food, seeing bugs in her bed room  Ideas of Reference:Paranoia; Delusions  Suicidal Thoughts:Suicidal Thoughts: No  Homicidal Thoughts:Homicidal Thoughts: No   Sensorium  Memory:Immediate Good; Recent Good; Remote Fair  Judgment:Poor  Insight:Poor   Executive Functions  Concentration:Fair  Attention Span:Good  [place]  Language:Good   Psychomotor Activity  Psychomotor Activity:Psychomotor Activity: Normal   Assets  Assets:Communication Skills; Housing; Social Support    Sleep  Sleep:Sleep: Good   Physical Exam: Physical Exam Constitutional:      Appearance: Normal appearance.  HENT:     Head: Normocephalic and atraumatic.     Nose: Nose normal.  Cardiovascular:     Rate and Rhythm: Normal rate.  Pulmonary:     Effort: Pulmonary effort is normal.  Skin:    General: Skin is warm and dry.  Neurological:     General: No focal deficit present.     Mental Status: [name] alert and oriented to person, place, and time.    Review of Systems  Constitutional: Negative.   HENT: Negative.     Eyes: Negative.   Respiratory: Negative.    Cardiovascular: Negative.   Gastrointestinal:  Positive for diarrhea.  Genitourinary: Negative.   Musculoskeletal:  Positive for back pain and joint pain.  Skin: Negative.   Neurological: Negative.   Endo/Heme/Allergies: Negative.   Psychiatric/Behavioral:  The patient is nervous/anxious.        Chronic delusion/Paranoia   Blood pressure (!) 171/122, pulse 84, temperature 98.2 F (36.8 C), temperature source Oral, resp. rate 16, SpO2 99 %. There is no height or weight on file to calculate BMI.  Medical Decision Making: Patient does not meet criteria for inpatient Psychiatric evaluation.  Patient has had Chronic Persecutory Delusion.  [name] currently on Saphris and we will get an EKG to evaluate QTC interval before an increase.   She has been tried on Risperdal, Geodon and Geodon was decreased due to QTC prolongation.  Problem 1: Delusional disorder, Persecutory type  Problem 2: Paranoid Delusion  Problem 3: Anxiety disorder  Disposition:  Reevaluate in am, consider increase in Saphris if QTC interval is normal.  Delfin Gant, NP-PMHNP-BC 06/26/2021 6:08 PM

## 2021-06-26 NOTE — ED Provider Notes (Signed)
[facility] DEPT Provider Note   CSN: [id number] Arrival date & time: 06/26/21  0538     History  Chief Complaint  Patient presents with   Paranoid    Tiffany [name] is a 61 y.o. female.  Patient is a 61 yo female with PMH of coronary artery disease, hypertension, hyperlipidemia, diabetes, diabetic neuropathy, encephalopathy, schizoaffective disorder, substance abuse and cocaine use, MI, stroke, and anoxic brain injury presenting for paranoia. Pt states "After I left the hospital I started having diarrhea. The diarrhea has bugs in it. It started after I ate cookies my mom made".   The history is provided by the patient. No language interpreter was used.       Home Medications Prior to Admission medications   Medication Sig Start Date End Date Taking? Authorizing Provider  albuterol (VENTOLIN HFA) 108 (90 Base) MCG/ACT inhaler Inhale 2 puffs into the lungs every 6 (six) hours as needed for wheezing or shortness of breath. 02/09/21  Yes Newlin, Enobong, MD  amLODipine (NORVASC) 5 MG tablet Take 1 tablet (5 mg total) by mouth at bedtime. 06/24/21 06/24/22 Yes [name], MD  Asenapine Maleate 10 MG SUBL Place 1 tablet (10 mg total) under the tongue 2 (two) times daily. 04/06/21  Yes Shelly Coss, MD  atorvastatin (LIPITOR) 20 MG tablet Take 1 tablet (20 mg total) by mouth daily. 06/24/21  Yes [name], MD  carvedilol (COREG) 6.25 MG tablet Take 1 tablet (6.25 mg total) by mouth 2 (two) times daily with a meal. 06/24/21  Yes [name], MD  colchicine 0.6 MG tablet Take 2 tablets by mouth at the onset of gout flare, may repeat 1 tablet in 1 hour if symptoms persist 06/24/21  Yes Newlin, Enobong, MD  diazepam (VALIUM) 5 MG tablet Take 5 mg by mouth at bedtime. 05/18/21  Yes [provider]  diclofenac (VOLTAREN) 75 MG EC tablet Take 75 mg by mouth 2 (two) times daily. 05/15/21  Yes [provider]  diclofenac Sodium (VOLTAREN) 1 %  GEL Apply 4 g topically 4 (four) times daily as needed (pain). Patient taking differently: Apply 1 application  topically 3 (three) times daily. 04/19/21  Yes [name], MD  dicyclomine (BENTYL) 10 MG capsule Take 1 capsule (10 mg total) by mouth 3 (three) times daily before meals. 06/24/21  Yes [name], MD  DULoxetine (CYMBALTA) 30 MG capsule Take 30 mg by mouth every morning. 03/22/21  Yes [provider]  escitalopram (LEXAPRO) 20 MG tablet Take 20 mg by mouth every morning.   Yes [provider]  febuxostat (ULORIC) 40 MG tablet Take 40 mg by mouth every morning. 05/07/21  Yes [provider]  Fluticasone-Umeclidin-Vilant (TRELEGY ELLIPTA) 100-62.5-25 MCG/ACT AEPB Inhale 1 puff into the lungs daily. 06/24/21  Yes [name], MD  Iron, Ferrous Sulfate, 325 (65 Fe) MG TABS Take 325 mg by mouth daily. 06/24/21  Yes [name], MD  methocarbamol (ROBAXIN) 500 MG tablet Take 500 mg by mouth 4 (four) times daily as needed for muscle spasms.   Yes [provider]  montelukast (SINGULAIR) 10 MG tablet Take 1 tablet (10 mg total) by mouth at bedtime. 01/14/21  Yes Mercy Riding, MD  omeprazole (PRILOSEC) 40 MG capsule Take 1 capsule (40 mg total) by mouth every morning. 06/24/21  Yes [name], MD  oxybutynin (DITROPAN) 5 MG tablet Take 1 tablet (5 mg total) by mouth 2 (two) times daily. 06/24/21  Yes [name], MD  oxyCODONE (OXY IR/ROXICODONE) 5 MG immediate release tablet Take 5 mg by mouth every 4 (four) hours as needed for severe pain or moderate pain.   Yes [provider]  potassium chloride SA (KLOR-CON M) 20 MEQ tablet Take 1 tablet (20 mEq total) by mouth daily. 06/24/21 07/24/21 Yes [name], MD  prazosin (MINIPRESS) 2 MG capsule Take 1 capsule (2 mg total) by mouth at bedtime. TAKE 1 CAPSULE(2 MG) BY MOUTH AT BEDTIME Strength: 2 mg 06/24/21  Yes Newlin, Enobong, MD  pregabalin (LYRICA) 75 MG capsule Take 75 mg by mouth 2 (two)  times daily. 05/15/21  Yes [provider]  terbinafine (LAMISIL AT) 1 % cream Apply 1 application  topically 2 (two) times daily. 06/24/21  Yes [name], MD  torsemide (DEMADEX) 20 MG tablet Take 1 tablet (20 mg total) by mouth daily. 06/24/21 07/24/21 Yes Newlin, Enobong, MD  VYVANSE 10 MG capsule Take 10 mg by mouth every morning. 06/17/21  Yes [provider]  thiamine 100 MG tablet Take 1 tablet (100 mg total) by mouth daily. Patient not taking: Reported on 06/17/2021 02/27/20   Modena Jansky, MD      Allergies    Hydrocodone, Anette Guarneri [lurasidone hcl], Magnesium-containing compounds, Prednisone, Tramadol, Codeine, Trazodone, Topamax [topiramate], Sulfa antibiotics, and Tape    Review of Systems   Review of Systems  Constitutional:  Negative for chills and fever.  HENT:  Negative for ear pain and sore throat.   Eyes:  Negative for pain and visual disturbance.  Respiratory:  Negative for cough and shortness of breath.   Cardiovascular:  Negative for chest pain and palpitations.  Gastrointestinal:  Positive for diarrhea. Negative for abdominal pain, nausea and vomiting.  Genitourinary:  Negative for dysuria and hematuria.  Musculoskeletal:  Negative for arthralgias and back pain.  Skin:  Negative for color change and rash.  Neurological:  Negative for seizures and syncope.  Psychiatric/Behavioral:  Positive for confusion.   All other systems reviewed and are negative.   Physical Exam Updated Vital Signs BP (!) 174/102   Pulse 68   Temp 98.2 F (36.8 C) (Oral)   Resp 16   SpO2 95%  Physical Exam Vitals and nursing note reviewed.  Constitutional:      General: [name] not in acute distress.    Appearance: [name] well-developed.  HENT:     Head: Normocephalic and atraumatic.  Eyes:     Conjunctiva/sclera: Conjunctivae normal.  Cardiovascular:     Rate and Rhythm: Normal rate and regular rhythm.     Heart sounds: No murmur heard. Pulmonary:     Effort:  Pulmonary effort is normal. No respiratory distress.     Breath sounds: Normal breath sounds.  Abdominal:     Palpations: Abdomen is soft.     Tenderness: There is no abdominal tenderness.  Musculoskeletal:        General: No swelling.     Cervical back: Neck supple.  Skin:    General: Skin is warm and dry.     Capillary Refill: Capillary refill takes less than 2 seconds.  Neurological:     Mental Status: [name] alert.  Psychiatric:        Attention and Perception: She perceives visual hallucinations.        Mood and Affect: Mood normal.        Speech: Speech normal.        Behavior: Behavior normal.     ED Results / Procedures /  Treatments   Labs (all labs ordered are listed, but only abnormal results are displayed) Labs Reviewed  CBC WITH DIFFERENTIAL/PLATELET - Abnormal; Notable for the following components:      Result Value   Hemoglobin 10.7 (*)    HCT 35.2 (*)    All other components within normal limits  BASIC METABOLIC PANEL - Abnormal; Notable for the following components:   Creatinine, Ser 1.57 (*)    GFR, Estimated 37 (*)    All other components within normal limits  BLOOD GAS, VENOUS - Abnormal; Notable for the following components:   pO2, Ven <31 (*)    All other components within normal limits  URINALYSIS, ROUTINE W REFLEX MICROSCOPIC - Abnormal; Notable for the following components:   Color, Urine STRAW (*)    Protein, ur 30 (*)    Leukocytes,Ua MODERATE (*)    Bacteria, UA RARE (*)    All other components within normal limits  RAPID URINE DRUG SCREEN, HOSP PERFORMED - Abnormal; Notable for the following components:   Benzodiazepines POSITIVE (*)    All other components within normal limits  MAGNESIUM  TSH    EKG None  Radiology CT Head Wo Contrast  Result Date: 06/26/2021 CLINICAL DATA:  Delirium. EXAM: CT HEAD WITHOUT CONTRAST TECHNIQUE: Contiguous axial images were obtained from the base of the skull through the vertex without intravenous  contrast. RADIATION DOSE REDUCTION: This exam was performed according to the departmental dose-optimization program which includes automated exposure control, adjustment of the mA and/or kV according to patient size and/or use of iterative reconstruction technique. COMPARISON:  06/16/2021 FINDINGS: Brain: Motion degraded study. Within this limitation, there is no evidence for acute hemorrhage, hydrocephalus, mass lesion, or abnormal extra-axial fluid collection. No definite CT evidence for acute infarction. Diffuse loss of parenchymal volume is consistent with atrophy. Patchy low attenuation in the deep hemispheric and periventricular white matter is nonspecific, but likely reflects chronic microvascular ischemic demyelination. Vascular: No hyperdense vessel or unexpected calcification. Skull: No evidence for fracture. No worrisome lytic or sclerotic lesion. Sinuses/Orbits: The visualized paranasal sinuses are clear. Trace left mastoid effusion again noted. Visualized portions of the globes and intraorbital fat are unremarkable. Other: None. IMPRESSION: 1. No acute intracranial abnormality. 2. Atrophy with chronic small vessel ischemic disease. Electronically Signed   By: Misty Stanley M.D.   On: 06/26/2021 08:57    Procedures Procedures    Medications Ordered in ED Medications  acetaminophen (TYLENOL) tablet 650 mg (650 mg Oral Given 06/26/21 0805)  sodium chloride 0.9 % bolus 500 mL (0 mLs Intravenous Stopped 06/26/21 1019)  fentaNYL (SUBLIMAZE) injection 25 mcg (25 mcg Intravenous Given 06/26/21 1021)    ED Course/ Medical Decision Making/ A&P                           Medical Decision Making Amount and/or Complexity of Data Reviewed Labs: ordered. Radiology: ordered.  Risk OTC drugs. Prescription drug management.   61 yo female with PMH of coronary artery disease, hypertension, hyperlipidemia, diabetes, diabetic neuropathy, encephalopathy, schizoaffective disorder, substance abuse and  cocaine use, MI, stroke, and anoxic brain injury presenting for paranoia.  Patient is alert and oriented x2, no acute distress, afebrile, stable vital signs.  Not actively hallucinating however describing bugs crawling in her feces that she saw yesterday.  Otherwise calm and cooperative on exam.  No treatable medical causes of visual hallucinations at this time.  CT head demonstrates no acute process.  No  hypoxia.  No signs or symptoms of sepsis.  No urinary tract infection.  UDS positive for benzos only.  Patient had remote history of cocaine use.  None seen today.  Patient symptoms likely secondary to her schizoaffective disorder possibly with psychotic features.  Will consult TTS for further management.        Final Clinical Impression(s) / ED Diagnoses Final diagnoses:  Visual hallucinations  Schizoaffective disorder, unspecified type [facility])    Rx / DC Orders ED Discharge Orders     None         Lianne Cure, DO 13/24/40 1254

## 2021-06-27 ENCOUNTER — Telehealth: Payer: Self-pay | Admitting: *Deleted

## 2021-06-27 ENCOUNTER — Ambulatory Visit: Payer: Self-pay | Admitting: *Deleted

## 2021-06-27 ENCOUNTER — Telehealth: Payer: Self-pay

## 2021-06-27 DIAGNOSIS — R197 Diarrhea, unspecified: Secondary | ICD-10-CM | POA: Diagnosis not present

## 2021-06-27 DIAGNOSIS — F250 Schizoaffective disorder, bipolar type: Secondary | ICD-10-CM | POA: Diagnosis not present

## 2021-06-27 DIAGNOSIS — R29898 Other symptoms and signs involving the musculoskeletal system: Secondary | ICD-10-CM | POA: Diagnosis not present

## 2021-06-27 DIAGNOSIS — Z743 Need for continuous supervision: Secondary | ICD-10-CM | POA: Diagnosis not present

## 2021-06-27 DIAGNOSIS — R531 Weakness: Secondary | ICD-10-CM | POA: Diagnosis not present

## 2021-06-27 MED ORDER — DIAZEPAM 5 MG PO TABS
5.0000 mg | ORAL_TABLET | Freq: Once | ORAL | Status: AC
  Administered 2021-06-27: 5 mg via ORAL
  Filled 2021-06-27: qty 1

## 2021-06-27 NOTE — Telephone Encounter (Signed)
Call placed to patient and informed her that Dr Margarita Rana has refilled the medications she needed.  The patient said she was speaking with her pharmacy today and they did not mention filling additional prescriptions.  I instructed her to call her pharmacy and check again and then compare the medications she has with the med list on the AVS and she said she understood. Instructed her to call me back with questions.

## 2021-06-27 NOTE — Chronic Care Management (AMB) (Signed)
Pt rescheduled with Licensed Clinical SW for 07/08/2021  Julian Hy, [facility]  Direct Dial: [phone]

## 2021-06-27 NOTE — Telephone Encounter (Signed)
   Telephone encounter was:  Successful.  06/27/2021 Name: Tiffany [name] MRN: [id number] DOB: 1960-02-22  Tiffany [name] is a 61 y.o. year old female who is a primary care patient of [name], MD . The community resource team was consulted for assistance with Food Insecurity and Financial Difficulties related to rent  Care guide performed the following interventions: Follow up call placed to the patient to discuss status of referral.CG worked with pt and [employer] to get moms meals. [employer] case management will return our call in 1-2 days.  Follow Up Plan:  Care guide will follow up with patient by phone over the next few days  [place], [place]  Main Phone: [phone]  E-mail: [email or web address]  Website: [email or web address]

## 2021-06-27 NOTE — ED Provider Notes (Signed)
Patient requesting discharge home.  She was seen by behavioral health who did not feel she met criteria for inpatient management.  Patient be discharged   Ripley Fraise, MD 06/27/21 0130

## 2021-06-28 ENCOUNTER — Telehealth: Payer: Self-pay | Admitting: Family Medicine

## 2021-06-28 ENCOUNTER — Telehealth: Payer: Self-pay | Admitting: Pharmacist

## 2021-06-28 NOTE — Telephone Encounter (Signed)
Pt called to get some medications refilled and then stated she needs to call back

## 2021-06-28 NOTE — Progress Notes (Signed)
[facility])   [place] Team Medication Reconciliation  06/28/2021  Tiffany [name] 1960-10-06 [id number]  Reason for referral: Medication Reconciliation   Referral source: [facility] RN Current insurance: [employer]  Outreach:  Successful telephone call with Adriana Mccallum.  HIPAA identifiers verified.    OBJECTIVE:  Lab Results  Component Value Date   CREATININE 1.57 (H) 06/26/2021   CREATININE 1.62 (H) 06/18/2021   CREATININE 1.95 (H) 06/17/2021    Lab Results  Component Value Date   HGBA1C 5.1 06/17/2021       Component Value Date/Time   CHOL 127 08/05/2018 1748   TRIG 53 08/05/2018 1748   HDL 62 08/05/2018 1748   CHOLHDL 2.0 08/05/2018 1748   CHOLHDL 3.7 01/07/2017 0005   VLDL 38 01/07/2017 0005   LDLCALC 54 08/05/2018 1748   LDLDIRECT 52 04/05/2012 1210    BP Readings from Last 3 Encounters:  06/26/21 (!) 160/142  06/22/21 (!) 149/91  06/14/21 (!) 140/107    Allergies  Allergen Reactions   Hydrocodone Shortness Of Breath   Latuda [Lurasidone Hcl] Anaphylaxis   Magnesium-Containing Compounds Anaphylaxis    Tolerated Ensure   Prednisone Anaphylaxis, Swelling and Other (See Comments)    Tongue swelling, lip swelling, throat swelling, per pt    Tramadol Anaphylaxis and Swelling   Codeine Nausea And Vomiting   Trazodone Other (See Comments)    paranoia   Topamax [Topiramate] Other (See Comments)    Increases paranoia   Sulfa Antibiotics Itching   Tape Rash     Date Medication Reconciliation Performed: 06/28/2021 Pharmacy claims reviewed and spoke with patient.    albuterol (VENTOLIN HFA) 108 (90 Base) MCG/ACT inhaler 8.5 g    Sig - Route: Inhale 2 puffs into the lungs every 6 (six) hours as needed for wheezing or shortness of breath. - Inhalation   Confirmed with [facility] medication was filled on 06/27/2021 and delivered to patient on 06/28/2021. Prescriber: Nicki Guadalajara    Reported taking/using     amLODipine (NORVASC) 5 MG tablet 30 tablet    Sig - Route: Take 1 tablet (5 mg total) by mouth at bedtime. - Oral   Confirmed with [facility] medication was filled on 06/27/2021 and delivered to patient on 06/28/2021. #30  Prescriber: Nicki Guadalajara    Reported taking     Asenapine Maleate 10 MG SUBL 10 tablet    Sig - Route: Place 1 tablet (10 mg total) under the tongue 2 (two) times daily. - Sublingual   Last filled 06/16/2021 sold 06/24/2021 #60 Prescriber: Lennice Sites, MD   Reported taking    atorvastatin (LIPITOR) 20 MG tablet 90 tablet    Sig - Route: Take 1 tablet (20 mg total) by mouth daily. - Oral   Last filled 06/10/2021 and sold on 06/24/2021. #90 tablets Prescriber: Charlane Ferretti Newlin,MD    Reported taking     carvedilol (COREG) 6.25 MG tablet 60 tablet    Sig - Route: Take 1 tablet (6.25 mg total) by mouth 2 (two) times daily with a meal. - Oral   Sent to pharmacy as: carvedilol (COREG) 6.25 MG tablet   Notes to Pharmacy: Discontinue amlodipine due to pedal edema   E-Prescribing Status: Receipt confirmed by pharmacy (06/24/2021 10:19 AM EDT)   Last filled 04/19/2021 sold on 04/19/2021 #180 tablets Prescriber: Charlane Ferretti Newlin,MD    Reported taking     colchicine 0.6 MG tablet 30 tablet      Sig: Take 2 tablets  by mouth at the onset of gout flare, may repeat 1 tablet in 1 hour if symptoms persist   Confirmed with [facility] medication was filled on 06/27/2021 and delivered to patient on 06/28/2021. #30  Prescriber: Nicki Guadalajara    Reports taking - does not wait for a gout flare     diazepam (VALIUM) 5 MG tablet    Sig - Route: Take 5 mg by mouth at bedtime. - Oral   Class: Historical Med   Last filled 06/15/2021 Sold on 06/24/2021 #30 tablets Prescriber: Lennice Sites, MD   Reported taking     diclofenac (VOLTAREN) 75 MG EC tablet    Sig - Route: Take 75 mg by mouth 2 (two) times daily. - Oral   Last filled 05/15/2021 Sold on 05/17/2021 #60 tablets  Prescriber: Charlane Ferretti Newlin,MD    Out of medication     diclofenac Sodium (VOLTAREN) 1 % GEL 100 g    Sig - Route: Apply 4 g topically 4 (four) times daily as needed (pain). - Topical   Last filled 05/15/2021, sold on 05/17/2021 #100 grams Prescriber: Charlane Ferretti Newlin,MD    Reported using     dicyclomine (BENTYL) 10 MG capsule 90 capsule    Sig - Route: Take 1 capsule (10 mg total) by mouth 3 (three) times daily before meals. - Oral   Last filled 06/09/2021, sold on 06/24/2021 # 90 capsules Prescriber: Charlane Ferretti Newlin,MD    Reported taking     DULoxetine (CYMBALTA) 30 MG capsule    Sig - Route: Take 30 mg by mouth every morning. - Oral   Last filled 04/18/2021 sold on 04/18/2021 #30 tablets Prescriber: Lin Landsman, MD   Reported out of medication - has been out for 2 days     escitalopram (LEXAPRO) 20 MG tablet    Sig - Route: Take 20 mg by mouth every morning. - Oral   Last filled 06/15/2021 Sold on 06/24/2021 #30 tablets Prescriber: Lennice Sites, MD   Reported taking     febuxostat (ULORIC) 40 MG tablet    Sig - Route: Take 40 mg by mouth every morning. - Oral   Last filled 05/07/2021, sold 05/09/2021 #30 tablets Prescriber: Wendee Beavers, MD   Reported taking     Fluticasone-Umeclidin-Vilant (TRELEGY ELLIPTA) 100-62.5-25 MCG/ACT AEPB 60 each    Sig - Route: Inhale 1 puff into the lungs daily. - Inhalation   Last filled 06/24/2021, sold on 06/24/2021 #1 inhaler Prescriber: Charlane Ferretti Newlin,MD    Reported taking/using     Iron, Ferrous Sulfate, 325 (65 Fe) MG TABS 60 tablet    Sig - Route: Take 325 mg by mouth daily. - Oral   Last filled 06/24/2021, sold 06/24/2021 #60 tablets Prescriber: Charlane Ferretti Newlin,MD    Reported taking     methocarbamol (ROBAXIN) 500 MG tablet    Sig - Route: Take 500 mg by mouth 4 (four) times daily as needed for muscle spasms. - Oral   Last filled on 06/14/2021, sold on 06/24/2021 #20 tablets Prescriber: Montine Circle, PA   Reported taking twice daily      montelukast (SINGULAIR) 10 MG tablet    Sig - Route: Take 1 tablet (10 mg total) by mouth at bedtime. - Oral   Confirmed with [facility] medication was filled on 06/27/2021 and delivered to patient on 06/28/2021. #30  Prescriber: Nicki Guadalajara    Reported taking     omeprazole (PRILOSEC) 40 MG capsule 30 capsule    Sig - Route: Take 1 capsule (40  mg total) by mouth every morning. - Oral   Last filled 06/20/2021, sold on 06/24/2021 #30 Prescriber:Enobong Newlin,MD    Reported taking     oxybutynin (DITROPAN) 5 MG tablet 60 tablet    Sig - Route: Take 1 tablet (5 mg total) by mouth 2 (two) times daily. - Oral   Last filled 06/24/2021, sold on 06/24/2021 #180 tablets Prescriber: Charlane Ferretti Newlin,MD    Reported taking     oxyCODONE (OXY IR/ROXICODONE) 5 MG immediate release tablet   Sig - Route: Take 5 mg by mouth every 4 (four) hours as needed for severe pain or moderate pain. - Oral   Last filled 06/02/2021, sold on 06/04/2021 #20 tablets Prescriber: Barb Merino, MD   Reported not taking     potassium chloride SA (KLOR-CON M) 20 MEQ tablet 30 tablet    Sig - Route: Take 1 tablet (20 mEq total) by mouth daily. - Oral   Last filled 06/24/2021, sold on 06/24/2021 #30 tablets Prescriber: Charlane Ferretti Newlin,MD    Reported taking even without the torsemide daily     prazosin (MINIPRESS) 2 MG capsule 30 capsule    Sig - Route: Take 1 capsule (2 mg total) by mouth at bedtime. TAKE 1 CAPSULE(2 MG) BY MOUTH AT BEDTIME Strength: 2 mg - Oral   Last filled 06/24/2021, sold on 06/24/2021 #90 tablets Prescriber: Charlane Ferretti Newlin,MD    Reported taking     pregabalin (LYRICA) 75 MG capsule    Sig - Route: Take 75 mg by mouth 2 (two) times daily. - Oral   Last filled 05/15/2021 sold on 05/17/2021 #60 tablets Prescriber: Lin Landsman, MD   Reported taking     terbinafine (LAMISIL AT) 1 % cream 30 g    Sig - Route: Apply 1 application  topically 2 (two) times daily. - Topical   No prescription  claims in the last year, may have purchased OTC   Reported has on hand     thiamine 100 MG tablet    Sig - Route: Take 1 tablet (100 mg total) by mouth daily. - Oral   No prescription claims in the last year   Reported taking     torsemide (DEMADEX) 20 MG tablet 30 tablet    Sig - Route: Take 1 tablet (20 mg total) by mouth daily. - Oral   Last filled 04/19/2021, sold 04/19/2021 # 90 tablets Prescriber: Charlane Ferretti Newlin,MD   Reported taking - does not have on hand     VYVANSE 10 MG capsule    Sig - Route: Take 10 mg by mouth every morning. - Oral   Last filled 06/17/2021, sold 06/24/2021 #30 capsules Prescriber: Lennice Sites, MD   Reported taking      Assessment: Per conversation with patient, she reports that she needs a medication refill on:  diclofenac (VOLTAREN) 75 MG EC tablet    Sig - Route: Take 75 mg by mouth 2 (two) times daily. - Oral   Last filled 05/15/2021 Sold on 05/17/2021 #60 tablets Prescriber: Charlane Ferretti Newlin,MD      torsemide (DEMADEX) 20 MG tablet 30 tablet   Sig - Route: Take 1 tablet (20 mg total) by mouth daily. - Oral  Last filled 04/19/2021, sold 04/19/2021 # 90 tablets Prescriber: Charlane Ferretti Newlin,MD  Reported taking - does not have on hand    Based on review of pharmacy claims history and conversation with patient, patient is out of diclofenac 75 mg tablets, Take 1 tablet by mouth twice daily. Patient reports that she  does not have the torsemide on hand at this time. Patient requests medication be issued to [facility] in [place].   Reminded patient about appointment with PCP tomorrow, patient is unable to attend the appointment in person. Placed call to PCP office to request status of appointment.     Medication Assistance Findings:  No medication assistance needs identified. Patient is a dual enrolled patient with [employer].   Extra Help:  Already receiving Full Extra Help Low Income Subsidy   Plan: Will contact Dr. Margarita Rana  regarding patient's request for refill on diclofenac 75 mg tablets and torsemide 20 mg tablets.  Will follow up regarding status of patient appointment with Dr. Margarita Rana on 06/30/2021.

## 2021-06-29 ENCOUNTER — Telehealth: Payer: Self-pay | Admitting: Family Medicine

## 2021-06-29 NOTE — Telephone Encounter (Signed)
Call returned  to [facility], [facility] and informed her that patient's visit tomorrow will be a telephone visit and I will notify the patient. We are aware that she does not have transportation   I called patient and informed her that her appointment with Dr Margarita Rana tomorrow - 06/30/2021 is at 0950.  She was very Patent attorney.

## 2021-06-29 NOTE — Telephone Encounter (Signed)
Copied from [place] [phone]. Topic: Appointment Scheduling - Scheduling Inquiry for Clinic >> Jun 29, 2021  3:37 PM Leitha Schuller wrote: Reason for CRM: Rep w/ [facility] inquiring if patients hos fu appt scheduled for tomorrow 6-15 can be done virtually. Rep states patient informed her she does not have transportation   Please follow up w/ patient

## 2021-06-30 ENCOUNTER — Telehealth: Payer: Self-pay

## 2021-06-30 ENCOUNTER — Encounter: Payer: Self-pay | Admitting: Family Medicine

## 2021-06-30 ENCOUNTER — Other Ambulatory Visit: Payer: Self-pay | Admitting: *Deleted

## 2021-06-30 ENCOUNTER — Ambulatory Visit: Payer: Medicare Other | Attending: Family Medicine | Admitting: Family Medicine

## 2021-06-30 DIAGNOSIS — R296 Repeated falls: Secondary | ICD-10-CM | POA: Diagnosis not present

## 2021-06-30 DIAGNOSIS — F250 Schizoaffective disorder, bipolar type: Secondary | ICD-10-CM | POA: Diagnosis not present

## 2021-06-30 NOTE — Telephone Encounter (Addendum)
I called [facility], spoke to [facility] who confirmed that they have the referral for home health services but have not been able to reach the patient. I informed her that we have been able to speak with the patient and the provider spoke to her this morning.  Patient's telephone number [phone] was confirmed with [facility].   I called [employer] regarding PCS, spoke to [place] who said that they did receive a referral for PCS on 06/28/2021 from Lin Landsman, MD/[facility] but it was on the wrong form so it was voided. The referral needs to be submitted on the [facility]- 3051.  I called patient to inquire if she is followed by Dr Lin Landsman because we do not want to duplicate care and she said she is not followed by her.  She plans to continue to follow up with Dr Margarita Rana.  I told her that a PCS form can then be submitted by Dr Margarita Rana

## 2021-06-30 NOTE — Patient Outreach (Signed)
[facility]) Care Management  06/30/2021  MEILAH DELROSARIO July 08, 1960 [id number]   Member's case discussed with multidisciplinary team.  Notified that [facility] unable to accept referral due to complexity of member's case and staffing.  Member will have virtual visit with current PCP today, will continue to collaborate with Care Guide and [name] to establish means of transportation to office visits.  Will continue to collaborate plan of care with PCP office and Home Health team.  Will follow up with member directly tomorrow as planned.  [name], RN, MSN, [place] Manager [phone]

## 2021-06-30 NOTE — Progress Notes (Signed)
Virtual Visit via Telephone Note  I connected with Tiffany [name], on 06/30/2021 at 10:30 AM by telephone and verified that I am speaking with the correct person using two identifiers.   Consent: I discussed the limitations, risks, security and privacy concerns of performing an evaluation and management service by telephone and the availability of in person appointments. I also discussed with the patient that there may be a patient responsible charge related to this service. The patient expressed understanding and agreed to proceed.   Location of Patient: Home  Location of Provider: Clinic   Persons participating in Telemedicine visit: [name] Dr. Margarita Rana     History of Present Illness: Tiffany [name] is a 61 y.o. year old female with a history of hypertension, COPD, schizoaffective disorder, dyslipidemia, stage III chronic kidney disease, chronic pain, recurrent ED visits. She was last hospitalized for visual hallucination from 06/26/2021 through 06/27/2021.   She complains of her back hurting and she cannot seem to get settled. [name] having a hard time walking and she has been falling. She has no one living with her.  She breaks down crying. She receives meals on wheels. Her Sister comes around to help her with a shower. We have discussed at previous visits that she will be best suited by living in a facility however she has been strongly opposed to this.  Her next Psych appt is on 07/07/21; she had a visit with him yesterday. Past Medical History:  Diagnosis Date   Agitation 11/22/2017   Anoxic brain injury ([place]) 09/08/2016   C. Arrest due to respiratory failure and COPD exacerbation   Anxiety    Arthritis    "all over" (04/10/2016)   Asthma 10/18/2010   Binge eating disorder    Cardiac arrest ([place]) 09/08/2016   PEA   Carotid artery stenosis    1-39% bilateral by dopplers 11/2016   Chronic diastolic (congestive) heart failure (HCC)    Chronic kidney disease,  stage 3 (HCC)    Chronic pain syndrome 06/18/2012   Chronic post-traumatic stress disorder (PTSD) 05/27/2018   Chronic respiratory failure with hypoxia and hypercapnia ([place]) 06/22/2015   TRILOGY Vent >AVAPA-ES., Vt target 200-400, Max P 30 , PS max 20 , PS min 6-10 , E Max 6, E Min 4, Rate Auto AVAPS Rate 2 (titrate for pt comfort) , bleed O2 at 5l/m continuous flow .    Closed displaced fracture of fifth metacarpal bone 03/21/2018   Cocaine use disorder, severe, in sustained remission ([place]) 16/01/930   Complication of anesthesia    decreased bp, decreased heart rate   COPD (chronic obstructive pulmonary disease) ([place]) 07/08/2014   Depression    Diabetic neuropathy ([place]) 04/24/2011   Difficulty with speech 01/24/2018   Disorder of nervous system    Drug abuse ([place]) 11/21/2017   Dyslipidemia 04/24/2011   Elevated troponin 04/28/2012   Emphysema    Encephalopathy 11/21/2017   Essential hypertension 03/22/2016   Fibula fracture 07/10/2016   Frequent falls 10/11/2017   GERD (gastroesophageal reflux disease)    Gout 04/11/2017   Heart attack ([place]) 1980s   History of blood transfusion 1994   "couldn't stop bleeding from my period"   History of drug abuse in remission ([place]) 11/28/2015   Quit in 2017   Hyperlipidemia LDL goal <70    Incontinence    Manic depression ([place])    Morbid obesity ([place]) 10/18/2010   Obstructive sleep apnea 10/18/2010   On home oxygen therapy    "  6L; 24/7" (04/10/2016)   OSA on CPAP    "wear mask sometimes" (04/10/2016)   Paranoid ([place])    "sometimes; I'm on RX for it" (04/10/2016)   Prolonged Q-T interval on ECG    Rectal bleeding 12/31/2015   Schizoaffective disorder, bipolar type ([place]) 04/05/2018   Seasonal allergies    Seborrheic keratoses 12/31/2013   Seizures ([place])    "don't know what kind; last one was ~ 1 yr ago" (04/10/2016)   Sinus bradycardia    Stroke [facility]) 1980s   denies residual on 04/10/2016   Thrush 09/19/2013   Type 2 diabetes mellitus  ([place]) 10/18/2010   Allergies  Allergen Reactions   Hydrocodone Shortness Of Breath   Latuda [Lurasidone Hcl] Anaphylaxis   Magnesium-Containing Compounds Anaphylaxis    Tolerated Ensure   Prednisone Anaphylaxis, Swelling and Other (See Comments)    Tongue swelling, lip swelling, throat swelling, per pt    Tramadol Anaphylaxis and Swelling   Codeine Nausea And Vomiting   Trazodone Other (See Comments)    paranoia   Topamax [Topiramate] Other (See Comments)    Increases paranoia   Sulfa Antibiotics Itching   Tape Rash    Current Outpatient Medications on File Prior to Visit  Medication Sig Dispense Refill   albuterol (VENTOLIN HFA) 108 (90 Base) MCG/ACT inhaler Inhale 2 puffs into the lungs every 6 (six) hours as needed for wheezing or shortness of breath. 8.5 g 2   amLODipine (NORVASC) 5 MG tablet Take 1 tablet (5 mg total) by mouth at bedtime. 30 tablet 6   Asenapine Maleate 10 MG SUBL Place 1 tablet (10 mg total) under the tongue 2 (two) times daily. 10 tablet 0   atorvastatin (LIPITOR) 20 MG tablet Take 1 tablet (20 mg total) by mouth daily. 90 tablet 1   carvedilol (COREG) 6.25 MG tablet Take 1 tablet (6.25 mg total) by mouth 2 (two) times daily with a meal. 60 tablet 3   colchicine 0.6 MG tablet Take 2 tablets by mouth at the onset of gout flare, may repeat 1 tablet in 1 hour if symptoms persist 30 tablet 1   diazepam (VALIUM) 5 MG tablet Take 5 mg by mouth at bedtime.     diclofenac (VOLTAREN) 75 MG EC tablet Take 75 mg by mouth 2 (two) times daily.     diclofenac Sodium (VOLTAREN) 1 % GEL Apply 4 g topically 4 (four) times daily as needed (pain). (Patient taking differently: Apply 1 application  topically 3 (three) times daily.) 100 g 3   dicyclomine (BENTYL) 10 MG capsule Take 1 capsule (10 mg total) by mouth 3 (three) times daily before meals. 90 capsule 6   DULoxetine (CYMBALTA) 30 MG capsule Take 30 mg by mouth every morning.     escitalopram (LEXAPRO) 20 MG tablet Take 20  mg by mouth every morning.     febuxostat (ULORIC) 40 MG tablet Take 40 mg by mouth every morning.     Fluticasone-Umeclidin-Vilant (TRELEGY ELLIPTA) 100-62.5-25 MCG/ACT AEPB Inhale 1 puff into the lungs daily. 60 each 6   Iron, Ferrous Sulfate, 325 (65 Fe) MG TABS Take 325 mg by mouth daily. 60 tablet 2   methocarbamol (ROBAXIN) 500 MG tablet Take 500 mg by mouth 4 (four) times daily as needed for muscle spasms.     montelukast (SINGULAIR) 10 MG tablet Take 1 tablet (10 mg total) by mouth at bedtime. 30 tablet 6   omeprazole (PRILOSEC) 40 MG capsule Take 1 capsule (40 mg total) by  mouth every morning. 30 capsule 6   oxybutynin (DITROPAN) 5 MG tablet Take 1 tablet (5 mg total) by mouth 2 (two) times daily. 60 tablet 5   oxyCODONE (OXY IR/ROXICODONE) 5 MG immediate release tablet Take 5 mg by mouth every 4 (four) hours as needed for severe pain or moderate pain.     potassium chloride SA (KLOR-CON M) 20 MEQ tablet Take 1 tablet (20 mEq total) by mouth daily. 30 tablet 6   prazosin (MINIPRESS) 2 MG capsule Take 1 capsule (2 mg total) by mouth at bedtime. TAKE 1 CAPSULE(2 MG) BY MOUTH AT BEDTIME Strength: 2 mg 30 capsule 6   pregabalin (LYRICA) 75 MG capsule Take 75 mg by mouth 2 (two) times daily.     terbinafine (LAMISIL AT) 1 % cream Apply 1 application  topically 2 (two) times daily. 30 g 1   thiamine 100 MG tablet Take 1 tablet (100 mg total) by mouth daily. (Patient not taking: Reported on 06/17/2021)     torsemide (DEMADEX) 20 MG tablet Take 1 tablet (20 mg total) by mouth daily. 30 tablet 6   VYVANSE 10 MG capsule Take 10 mg by mouth every morning.     No current facility-administered medications on file prior to visit.    ROS: See HPI  Observations/Objective: Awake, alert, oriented x3 Not in acute distress Normal mood      Latest Ref Rng & Units 06/26/2021    7:39 AM 06/18/2021    8:25 AM 06/17/2021    5:38 AM  CMP  Glucose 70 - 99 mg/dL 82  116  104   BUN 8 - 23 mg/dL '21  25  22    '$ Creatinine 0.44 - 1.00 mg/dL 1.57  1.62  1.95   Sodium 135 - 145 mmol/L 139  136  140   Potassium 3.5 - 5.1 mmol/L 4.0  4.0  3.5   Chloride 98 - 111 mmol/L 108  104  105   CO2 22 - 32 mmol/L '25  27  28   '$ Calcium 8.9 - 10.3 mg/dL 9.6  8.5  8.6   Total Protein 6.5 - 8.1 g/dL   5.8   Total Bilirubin 0.3 - 1.2 mg/dL   0.3   Alkaline Phos 38 - 126 U/L   121   AST 15 - 41 U/L   30   ALT 0 - 44 U/L   23     Lipid Panel     Component Value Date/Time   CHOL 127 08/05/2018 1748   TRIG 53 08/05/2018 1748   HDL 62 08/05/2018 1748   CHOLHDL 2.0 08/05/2018 1748   CHOLHDL 3.7 01/07/2017 0005   VLDL 38 01/07/2017 0005   LDLCALC 54 08/05/2018 1748   LDLDIRECT 52 04/05/2012 1210   LABVLDL 11 08/05/2018 1748    Lab Results  Component Value Date   HGBA1C 5.1 06/17/2021    Assessment and Plan: 1. Frequent falls She will be best served living in a facility but she declines this I have spoken with the case manager on we will look into obtaining home health nursing services for her as she will benefit from PT and PCS services  [name] not stable to reside alone She has had recurrent ED visits and hospitalizations  2. Schizoaffective disorder, bipolar type ([place]) Currently under the care of psychiatry   Follow Up Instructions: Keep previously scheduled appointment   I discussed the assessment and treatment plan with the patient. The patient was provided an opportunity  to ask questions and all were answered. The patient agreed with the plan and demonstrated an understanding of the instructions.   The patient was advised to call back or seek an in-person evaluation if the symptoms worsen or if the condition fails to improve as anticipated.     I provided 14 minutes total of non-face-to-face time during this encounter.   [name], MD, FAAFP. [facility] and [place], [place]   06/30/2021, 10:30 AM

## 2021-07-01 ENCOUNTER — Other Ambulatory Visit: Payer: Self-pay | Admitting: *Deleted

## 2021-07-01 DIAGNOSIS — Z9181 History of falling: Secondary | ICD-10-CM | POA: Insufficient documentation

## 2021-07-01 DIAGNOSIS — D638 Anemia in other chronic diseases classified elsewhere: Secondary | ICD-10-CM | POA: Diagnosis not present

## 2021-07-01 DIAGNOSIS — I5032 Chronic diastolic (congestive) heart failure: Secondary | ICD-10-CM | POA: Diagnosis not present

## 2021-07-01 DIAGNOSIS — J449 Chronic obstructive pulmonary disease, unspecified: Secondary | ICD-10-CM | POA: Diagnosis not present

## 2021-07-01 DIAGNOSIS — G894 Chronic pain syndrome: Secondary | ICD-10-CM | POA: Diagnosis not present

## 2021-07-01 DIAGNOSIS — F32A Depression, unspecified: Secondary | ICD-10-CM | POA: Diagnosis not present

## 2021-07-01 DIAGNOSIS — I2510 Atherosclerotic heart disease of native coronary artery without angina pectoris: Secondary | ICD-10-CM | POA: Diagnosis not present

## 2021-07-01 DIAGNOSIS — I130 Hypertensive heart and chronic kidney disease with heart failure and stage 1 through stage 4 chronic kidney disease, or unspecified chronic kidney disease: Secondary | ICD-10-CM | POA: Diagnosis not present

## 2021-07-01 DIAGNOSIS — K529 Noninfective gastroenteritis and colitis, unspecified: Secondary | ICD-10-CM | POA: Diagnosis not present

## 2021-07-01 DIAGNOSIS — N1832 Chronic kidney disease, stage 3b: Secondary | ICD-10-CM | POA: Diagnosis not present

## 2021-07-01 NOTE — Patient Outreach (Signed)
[facility]) Care Management  07/01/2021  TRENIYA LOBB Aug 25, 1960 [id number]   Outgoing call placed to member, unsuccessful to both home and mobile numbers.  HIPAA compliant voice message left.  Will follow up within the next 2-3 business days.  [name], RN, MSN, [place] Manager [phone]

## 2021-07-04 ENCOUNTER — Ambulatory Visit: Payer: Self-pay | Admitting: *Deleted

## 2021-07-04 ENCOUNTER — Telehealth: Payer: Self-pay | Admitting: *Deleted

## 2021-07-04 ENCOUNTER — Encounter (HOSPITAL_COMMUNITY): Payer: Self-pay

## 2021-07-04 ENCOUNTER — Telehealth: Payer: Self-pay

## 2021-07-04 ENCOUNTER — Other Ambulatory Visit: Payer: Self-pay

## 2021-07-04 ENCOUNTER — Emergency Department (HOSPITAL_COMMUNITY)
Admission: EM | Admit: 2021-07-04 | Discharge: 2021-07-04 | Discharge status: Against Medical Advice | Payer: Medicare Other | Attending: Emergency Medicine | Admitting: Emergency Medicine

## 2021-07-04 DIAGNOSIS — M79605 Pain in left leg: Secondary | ICD-10-CM | POA: Diagnosis not present

## 2021-07-04 DIAGNOSIS — Z5321 Procedure and treatment not carried out due to patient leaving prior to being seen by health care provider: Secondary | ICD-10-CM | POA: Insufficient documentation

## 2021-07-04 DIAGNOSIS — M79604 Pain in right leg: Secondary | ICD-10-CM | POA: Insufficient documentation

## 2021-07-04 DIAGNOSIS — W19XXXA Unspecified fall, initial encounter: Secondary | ICD-10-CM | POA: Diagnosis not present

## 2021-07-04 DIAGNOSIS — M79606 Pain in leg, unspecified: Secondary | ICD-10-CM | POA: Diagnosis not present

## 2021-07-04 DIAGNOSIS — Z743 Need for continuous supervision: Secondary | ICD-10-CM | POA: Diagnosis not present

## 2021-07-04 DIAGNOSIS — R197 Diarrhea, unspecified: Secondary | ICD-10-CM | POA: Insufficient documentation

## 2021-07-04 LAB — COMPREHENSIVE METABOLIC PANEL
ALT: 17 U/L (ref 0–44)
AST: 22 U/L (ref 15–41)
Albumin: 3.8 g/dL (ref 3.5–5.0)
Alkaline Phosphatase: 154 U/L — ABNORMAL HIGH (ref 38–126)
Anion gap: 10 (ref 5–15)
BUN: 26 mg/dL — ABNORMAL HIGH (ref 8–23)
CO2: 22 mmol/L (ref 22–32)
Calcium: 9.6 mg/dL (ref 8.9–10.3)
Chloride: 108 mmol/L (ref 98–111)
Creatinine, Ser: 1.72 mg/dL — ABNORMAL HIGH (ref 0.44–1.00)
GFR, Estimated: 33 mL/min — ABNORMAL LOW (ref >60)
Glucose, Bld: 92 mg/dL (ref 70–99)
Potassium: 3.4 mmol/L — ABNORMAL LOW (ref 3.5–5.1)
Sodium: 140 mmol/L (ref 135–145)
Total Bilirubin: 0.6 mg/dL (ref 0.3–1.2)
Total Protein: 7.7 g/dL (ref 6.5–8.1)

## 2021-07-04 LAB — CBC
HCT: 36 % (ref 36.0–46.0)
Hemoglobin: 11 g/dL — ABNORMAL LOW (ref 12.0–15.0)
MCH: 27.4 pg (ref 26.0–34.0)
MCHC: 30.6 g/dL (ref 30.0–36.0)
MCV: 89.8 fL (ref 80.0–100.0)
Platelets: 394 10*3/uL (ref 150–400)
RBC: 4.01 MIL/uL (ref 3.87–5.11)
RDW: 15.8 % — ABNORMAL HIGH (ref 11.5–15.5)
WBC: 5.7 10*3/uL (ref 4.0–10.5)
nRBC: 0 % (ref 0.0–0.2)

## 2021-07-04 LAB — LIPASE, BLOOD: Lipase: 27 U/L (ref 11–51)

## 2021-07-04 NOTE — Telephone Encounter (Signed)
   Mail encounter was:  Successful.  07/04/2021 Name: Tiffany [name] MRN: [id number] DOB: 1960/09/29  Tiffany [name] is a 61 y.o. year old female who is a primary care patient of [name], MD . The community resource team was consulted for assistance with Food Insecurity and Financial Difficulties related to bills  Care guide performed the following interventions:  CG sending requested resources . Mail enclosed: Home Modification Programs such as [place] [employer], Social research officer, government. CG also sending Aging Pensions consultant, Food delivery programs such as meals on wheels and lasagna love. CG also arranged meal delivery with pts insurance company [employer]. CG will also send financial resources to patient.  Follow Up Plan:  Care guide will follow up with patient by phone over the next few days  [place], [place]  Main Phone: [phone]  E-mail: [email or web address]  Website: [email or web address]

## 2021-07-04 NOTE — ED Triage Notes (Addendum)
Per EMS- Patient coming from home with c/o bilateral leg pain Patient reports a history of arthritis.  Patient states she is still having intermittent diarrhea. Patient was seen on 06/30/21 for the same.

## 2021-07-04 NOTE — Patient Outreach (Signed)
  Care Management   Follow-Up Note   07/04/2021  Name: Tiffany [name] MRN: [id number] DOB: [birth year]  Referred By: [name], MD  Reason for Referral: Care Coordination Needs in Patient with Chronic Pain Syndrome, Altered Mental Status, Morbid Obesity, Tobacco Use Disorder, Seizures, Schizophrenia, Schizoaffective Disorder, Bipolar Type, Post-Traumatic Stress Disorder, Aggressive Behavior, Anxiety, Depression, Binge Eating Disorder, and Family Discord.  An unsuccessful telephone outreach was attempted today. The patient was referred to the case management team for assistance with care management and care coordination. HIPAA compliant messages were left on voicemail for patient, providing contact information, encouraging patient to return LCSW's call at her earliest convenience.  LCSW will make a second initial telephone outreach call attempt within the next 5-7 business days, if a return call is not received from patient in the meantime.  Follow-Up Plan:  LCSW second initial telephone outreach call attempt scheduled on 07/11/2021 at 11:15 am.   [name], BSW, MSW, [place]  Licensed Clinical Social Worker  [place]  Mailing [place]. [address], [place], [place] [address] Physical Address-[address] [place], [place] [address] Toll Free Main # [phone] Fax # [phone] Cell # [phone] [email or web address]

## 2021-07-05 ENCOUNTER — Other Ambulatory Visit: Payer: Self-pay | Admitting: Family Medicine

## 2021-07-05 DIAGNOSIS — J449 Chronic obstructive pulmonary disease, unspecified: Secondary | ICD-10-CM | POA: Diagnosis not present

## 2021-07-05 DIAGNOSIS — G894 Chronic pain syndrome: Secondary | ICD-10-CM | POA: Diagnosis not present

## 2021-07-05 DIAGNOSIS — F32A Depression, unspecified: Secondary | ICD-10-CM | POA: Diagnosis not present

## 2021-07-05 DIAGNOSIS — Z9181 History of falling: Secondary | ICD-10-CM | POA: Diagnosis not present

## 2021-07-05 DIAGNOSIS — I130 Hypertensive heart and chronic kidney disease with heart failure and stage 1 through stage 4 chronic kidney disease, or unspecified chronic kidney disease: Secondary | ICD-10-CM | POA: Diagnosis not present

## 2021-07-05 DIAGNOSIS — N1832 Chronic kidney disease, stage 3b: Secondary | ICD-10-CM | POA: Diagnosis not present

## 2021-07-05 DIAGNOSIS — I5032 Chronic diastolic (congestive) heart failure: Secondary | ICD-10-CM | POA: Diagnosis not present

## 2021-07-05 DIAGNOSIS — I2510 Atherosclerotic heart disease of native coronary artery without angina pectoris: Secondary | ICD-10-CM | POA: Diagnosis not present

## 2021-07-05 DIAGNOSIS — D638 Anemia in other chronic diseases classified elsewhere: Secondary | ICD-10-CM | POA: Diagnosis not present

## 2021-07-05 DIAGNOSIS — K529 Noninfective gastroenteritis and colitis, unspecified: Secondary | ICD-10-CM | POA: Diagnosis not present

## 2021-07-05 NOTE — Telephone Encounter (Signed)
Pt stating she can not wait 24 hrs for this med request as the hospital deleted all her meds and she needs them today. FU at [phone]

## 2021-07-06 ENCOUNTER — Other Ambulatory Visit: Payer: Self-pay | Admitting: *Deleted

## 2021-07-06 NOTE — Patient Outreach (Signed)
[facility]) Care Management Telephonic RN Care Manager Note   07/06/2021 Name:  Tiffany [name] MRN:  [id number] DOB:  [birth year]  Summary: Outreach attempt #2, successful however call was not productive.  Member was very drowsy, falling asleep in the phone.  State she will call back later today.   Subjective: Tiffany [name] is an 61 y.o. year old female who is a primary patient of Tiffany Rakes, MD. The care management team was consulted for assistance with care management and/or care coordination needs.    Telephonic RN Care Manager completed Telephone Visit today.  Objective:   Medications Reviewed Today     Reviewed by Tiffany Rakes, MD (Physician) on 06/30/21 at 1355  Med List Status: <None>   Medication Order Taking? Sig Documenting Provider Last Dose Status Informant  albuterol (VENTOLIN HFA) 108 (90 Base) MCG/ACT inhaler [id number] No Inhale 2 puffs into the lungs every 6 (six) hours as needed for wheezing or shortness of breath. Tiffany Rakes, MD 06/25/2021 Active Self  amLODipine (NORVASC) 5 MG tablet [id number] No Take 1 tablet (5 mg total) by mouth at bedtime. Tiffany Rakes, MD 06/25/2021 Active Self           Med Note Dory Larsen, Lonn [place] Jun 26, 2021  8:27 AM) Pt states they are supposed to take 10 mg daily, however dispense history shows 5 mg   Asenapine Maleate 10 MG SUBL [id number] No Place 1 tablet (10 mg total) under the tongue 2 (two) times daily. Shelly Coss, MD 06/25/2021 Active Self           Med Note Tiffany [name] Jun 26, 2021  8:28 AM)    atorvastatin (LIPITOR) 20 MG tablet [id number] No Take 1 tablet (20 mg total) by mouth daily. Tiffany Rakes, MD 06/25/2021 Active Self  carvedilol (COREG) 6.25 MG tablet [id number] No Take 1 tablet (6.25 mg total) by mouth 2 (two) times daily with a meal. Tiffany Rakes, MD 06/25/2021 2300 Active Self  colchicine 0.6 MG tablet [id number] No Take 2 tablets by mouth at the onset of gout  flare, may repeat 1 tablet in 1 hour if symptoms persist Tiffany Rakes, MD 06/25/2021 Active Self  diazepam (VALIUM) 5 MG tablet [id number] No Take 5 mg by mouth at bedtime. [provider] Past Week Active Self           Med Note Tiffany [name] Jun 26, 2021  8:41 AM)    diclofenac (VOLTAREN) 75 MG EC tablet [id number] No Take 75 mg by mouth 2 (two) times daily. [provider] Past Week Active Self           Med Note Tiffany [name], Sharlette Dense   Fri Jun 17, 2021  8:22 AM) #60 filled 05/15/21 per bottle (approx 25 tablets left)  diclofenac Sodium (VOLTAREN) 1 % GEL [id number] No Apply 4 g topically 4 (four) times daily as needed (pain).  Patient taking differently: Apply 1 application  topically 3 (three) times daily.   Tiffany Rakes, MD 06/25/2021 Active Self  dicyclomine (BENTYL) 10 MG capsule [id number] No Take 1 capsule (10 mg total) by mouth 3 (three) times daily before meals. Tiffany Rakes, MD 06/25/2021 Active Self  DULoxetine (CYMBALTA) 30 MG capsule [id number] No Take 30 mg by mouth every morning. [provider] 06/25/2021 Active Self           Med Note Tiffany [name], Tiffany L   Fri Jun 17, 2021  8:23  AM) #30 filled 04/18/21 per bottle (#1 left)  escitalopram (LEXAPRO) 20 MG tablet [id number] No Take 20 mg by mouth every morning. [provider] 06/25/2021 Active Self           Med Note Tiffany [name] Jun 26, 2021  8:41 AM)    febuxostat (ULORIC) 40 MG tablet [id number] No Take 40 mg by mouth every morning. [provider] 06/25/2021 Active Self           Med Note Tiffany [name] Jun 26, 2021  8:33 AM)    Fluticasone-Umeclidin-Vilant (TRELEGY ELLIPTA) 100-62.5-25 MCG/ACT AEPB [id number] No Inhale 1 puff into the lungs daily. Tiffany Rakes, MD 06/25/2021 Active Self  Iron, Ferrous Sulfate, 325 (65 Fe) MG TABS [id number] No Take 325 mg by mouth daily. Tiffany Rakes, MD 06/25/2021 Active Self  methocarbamol (ROBAXIN) 500 MG tablet  [id number] No Take 500 mg by mouth 4 (four) times daily as needed for muscle spasms. [provider] 06/25/2021 Active   montelukast (SINGULAIR) 10 MG tablet [id number] No Take 1 tablet (10 mg total) by mouth at bedtime. Mercy Riding, MD 06/25/2021 Active Self           Med Note (ATKINS, Tiffany L   Fri Jun 17, 2021  8:27 AM) #30 filled 04/12/21 per bottle (approx 20 tablets left)  omeprazole (PRILOSEC) 40 MG capsule [id number] No Take 1 capsule (40 mg total) by mouth every morning. Tiffany Rakes, MD 06/25/2021 Active Self  oxybutynin (DITROPAN) 5 MG tablet [id number] No Take 1 tablet (5 mg total) by mouth 2 (two) times daily. Tiffany Rakes, MD 06/25/2021 Active Self  oxyCODONE (OXY IR/ROXICODONE) 5 MG immediate release tablet [id number] No Take 5 mg by mouth every 4 (four) hours as needed for severe pain or moderate pain. [provider] Past Month Active Self           Med Note Tiffany [name], Sharlette Dense   Fri Jun 17, 2021  8:18 AM) #20 filled 06/02/21 per bottle (bottle is empty)  potassium chloride SA (KLOR-CON M) 20 MEQ tablet [id number] No Take 1 tablet (20 mEq total) by mouth daily. Tiffany Rakes, MD 06/25/2021 Active Self  prazosin (MINIPRESS) 2 MG capsule [id number] No Take 1 capsule (2 mg total) by mouth at bedtime. TAKE 1 CAPSULE(2 MG) BY MOUTH AT BEDTIME Strength: 2 mg Tiffany Rakes, MD 06/25/2021 Active Self  pregabalin (LYRICA) 75 MG capsule [id number] No Take 75 mg by mouth 2 (two) times daily. [provider] 06/25/2021 Active Self           Med Note Tiffany [name], Tiffany L   Fri Jun 17, 2021  8:24 AM) #60 filled 05/15/21 per bottle (#24 left)  terbinafine (LAMISIL AT) 1 % cream [id number] No Apply 1 application  topically 2 (two) times daily. Tiffany Rakes, MD 06/25/2021 Active Self  thiamine 100 MG tablet [id number] No Take 1 tablet (100 mg total) by mouth daily.  Patient not taking: Reported on 06/17/2021   Modena Jansky, MD Not Taking Active Self           Med Note Tiffany [name],  Sharlette Dense   Fri Jun 17, 2021  8:43 AM) Not [name] in bag with pt's other medications   torsemide (DEMADEX) 20 MG tablet [id number] No Take 1 tablet (20 mg total) by mouth daily. Tiffany Rakes, MD 06/25/2021 Active Self  VYVANSE 10 MG capsule [id number] No Take 10 mg by mouth every morning. [provider] 06/25/2021 Active  Med List Note Latina Craver, [facility] 05/19/20 1422): ,             SDOH:  (Social Determinants of Health) assessments and interventions performed:     Care Plan  Review of patient past medical history, allergies, medications, health status, including review of consultants reports, laboratory and other test data, was performed as part of comprehensive evaluation for care management services.   Care Plan : [facility] Plan of Care (Adult)  Updates made by [name], RN since 07/06/2021 12:00 AM     Problem: Care coordination needs related to multiple comorbidities   Priority: High     Long-Range Goal: Member will be able to adequately verbalize management of chronic comorbidities and have additional support in the home   Start Date: 06/23/2021  Expected End Date: 06/24/2022  This Visit's Progress: On track  Priority: High  Note:   Current Barriers:  Knowledge Deficits related to plan of care for management of CHF, COPD, and Bipolar Disorder  Care Coordination needs related to Limited social support, Transportation, Limited access to food, Level of care concerns, ADL IADL limitations, Mental Health Concerns , and Inability to perform ADL's independently Lacks caregiver support Literacy barriers Transportation barriers  RNCM Clinical Goal(s):  Patient will verbalize understanding of plan for management of CHF, COPD, and Bipolar Disorder as evidenced by Patient verbalizing adequate plan of care continue to work with RN Care Manager to address care management and care coordination needs related to  CHF, COPD, and Bipolar Disorder as evidenced by adherence to CM  Team Scheduled appointments work with pharmacist to address Limited social support, Transportation, Limited access to food, Level of care concerns, and ADL IADL limitations related toCHF, COPD, and Bipolar Disorder as evidenced by review or EMR and patient or pharmacist report work with Education officer, museum to address  related to the management of Limited social support, Transportation, Limited access to food, Level of care concerns, and ADL IADL limitations related to the management of CHF, COPD, and Bipolar Disorder as evidenced by review of EMR and patient or Education officer, museum report work with Data processing manager care guide to address needs related to  Limited social support, Transportation, Limited access to food, Level of care concerns, and ADL IADL limitations as evidenced by patient and/or community resource care guide support work with [employer] to establish in home primary care as evidenced by reported engagement by [employer] not experience hospital admission as evidenced by review of EMR. Hospital Admissions in last 6 months = 5 experience decrease in ED visits as evidenced by EMR review.  ED visits in in last 6 months = 20  through collaboration with RN Care manager, provider, and care team.   Interventions: Inter-disciplinary care team collaboration (see longitudinal plan of care) Evaluation of current treatment plan related to  self management and patient's adherence to plan as established by provider   Heart Failure Interventions:  (Status:  Goal on track:  Yes.) Long Term Goal Provided education on low sodium diet Reviewed Heart Failure Action Plan in depth and provided written copy  COPD Interventions:  (Status:  Goal on track:  Yes.) Long Term Goal Provided patient with basic written and verbal COPD education on self care/management/and exacerbation prevention Provided written and verbal instructions on pursed lip breathing and utilized returned demonstration as teach back  Patient  Goals/Self-Care Activities: Take all medications as prescribed Attend all scheduled provider appointments keep legs up while sitting use salt in moderation develop a rescue plan  eliminate smoking in my home develop a rescue plan follow rescue plan if symptoms flare-up  Follow Up Plan:  The patient has been provided with contact information for the care management team and has been advised to call with any health related questions or concerns.    Update 6/21 - Member advised that [name] has attempted to contact her in regards to psych follow up, provider will follow up next week.  Member has been to the ED in the last week for leg pain, eloped prior to being [name].  [name] very sleepy today, difficult to understand, state she will call back.  Call placed to [facility] home health, confirms PT and OT has started but no other disciplines.  Advised that RN, SW, and aide should all be ordered.  Confirmed order was placed, they will add services to contract.          Plan:  Telephone follow up appointment with care management team member scheduled for:  1 week The patient has been provided with contact information for the care management team and has been advised to call with any health related questions or concerns.   [name], RN, MSN, [place] Manager [phone]

## 2021-07-07 ENCOUNTER — Ambulatory Visit: Payer: Self-pay

## 2021-07-07 NOTE — Telephone Encounter (Signed)
diclofenac (VOLTAREN) 75 MG EC tablet [Pharmacy Med Name: DICLOFENAC SODIUM 75 MG ORAL TABLET DELAYED RELEASE] 60 tablet 07/06/2021  Request refused: Refill not appropriate (She did have acute on chronic kidney injury on NSAID)  Sig: TAKE 1 TABLET (75 MG) BY MOUTH TWICE DAILY AS NEEDED   Pls FU with pt as very impatient to receive and needs nurse FU to explain   [phone]   See refill request. Asks it be sent to "[facility]."  Answer Assessment - Initial Assessment Questions 1. DRUG NAME: "What medicine do you need to have refilled?"     Voltaren tablets 2. REFILLS REMAINING: "How many refills are remaining?" (Note: The label on the medicine or pill bottle will show how many refills are remaining. If there are no refills remaining, then a renewal may be needed.)     0 3. EXPIRATION DATE: "What is the expiration date?" (Note: The label states when the prescription will expire, and thus can no longer be refilled.)     N/a 4. PRESCRIBING HCP: "Who prescribed it?" Reason: If prescribed by specialist, call should be referred to that group.     Historical medication 5. SYMPTOMS: "Do you have any symptoms?"     N/a 6. PREGNANCY: "Is there any chance that you are pregnant?" "When was your last menstrual period?"     No  Protocols used: Medication Refill and Renewal Call-A-AH

## 2021-07-07 NOTE — Telephone Encounter (Signed)
Please see my notes from 04/19/21. She has GI bleed due to NSAID use and was hospitalized for this, transfused with 7 units of blood. Diclofenac was discontinued. She also has chronic kidney disease and I have relayed to her in the past that I am unable to refill this medication.

## 2021-07-07 NOTE — Telephone Encounter (Signed)
Routing to PCP for review.

## 2021-07-08 ENCOUNTER — Ambulatory Visit: Payer: Self-pay | Admitting: *Deleted

## 2021-07-08 ENCOUNTER — Other Ambulatory Visit: Payer: Self-pay | Admitting: Family Medicine

## 2021-07-08 DIAGNOSIS — N1832 Chronic kidney disease, stage 3b: Secondary | ICD-10-CM | POA: Diagnosis not present

## 2021-07-08 DIAGNOSIS — G894 Chronic pain syndrome: Secondary | ICD-10-CM | POA: Diagnosis not present

## 2021-07-08 DIAGNOSIS — I5032 Chronic diastolic (congestive) heart failure: Secondary | ICD-10-CM | POA: Diagnosis not present

## 2021-07-08 DIAGNOSIS — Z9181 History of falling: Secondary | ICD-10-CM | POA: Diagnosis not present

## 2021-07-08 DIAGNOSIS — I2510 Atherosclerotic heart disease of native coronary artery without angina pectoris: Secondary | ICD-10-CM | POA: Diagnosis not present

## 2021-07-08 DIAGNOSIS — D638 Anemia in other chronic diseases classified elsewhere: Secondary | ICD-10-CM | POA: Diagnosis not present

## 2021-07-08 DIAGNOSIS — F32A Depression, unspecified: Secondary | ICD-10-CM | POA: Diagnosis not present

## 2021-07-08 DIAGNOSIS — I130 Hypertensive heart and chronic kidney disease with heart failure and stage 1 through stage 4 chronic kidney disease, or unspecified chronic kidney disease: Secondary | ICD-10-CM | POA: Diagnosis not present

## 2021-07-08 DIAGNOSIS — J449 Chronic obstructive pulmonary disease, unspecified: Secondary | ICD-10-CM | POA: Diagnosis not present

## 2021-07-08 DIAGNOSIS — K529 Noninfective gastroenteritis and colitis, unspecified: Secondary | ICD-10-CM | POA: Diagnosis not present

## 2021-07-09 ENCOUNTER — Emergency Department (HOSPITAL_COMMUNITY): Payer: Medicare Other

## 2021-07-09 ENCOUNTER — Encounter (HOSPITAL_COMMUNITY): Payer: Self-pay | Admitting: Emergency Medicine

## 2021-07-09 ENCOUNTER — Other Ambulatory Visit: Payer: Self-pay

## 2021-07-09 ENCOUNTER — Observation Stay (HOSPITAL_COMMUNITY)
Admission: EM | Admit: 2021-07-09 | Discharge: 2021-07-12 | Disposition: A | Discharge status: Home Health | Payer: Medicare Other | Attending: Internal Medicine | Admitting: Internal Medicine

## 2021-07-09 DIAGNOSIS — Z20822 Contact with and (suspected) exposure to covid-19: Secondary | ICD-10-CM | POA: Diagnosis not present

## 2021-07-09 DIAGNOSIS — N183 Chronic kidney disease, stage 3 unspecified: Secondary | ICD-10-CM | POA: Insufficient documentation

## 2021-07-09 DIAGNOSIS — E1140 Type 2 diabetes mellitus with diabetic neuropathy, unspecified: Secondary | ICD-10-CM | POA: Insufficient documentation

## 2021-07-09 DIAGNOSIS — R296 Repeated falls: Secondary | ICD-10-CM | POA: Diagnosis not present

## 2021-07-09 DIAGNOSIS — F419 Anxiety disorder, unspecified: Secondary | ICD-10-CM | POA: Diagnosis present

## 2021-07-09 DIAGNOSIS — R55 Syncope and collapse: Principal | ICD-10-CM | POA: Insufficient documentation

## 2021-07-09 DIAGNOSIS — R079 Chest pain, unspecified: Secondary | ICD-10-CM | POA: Diagnosis not present

## 2021-07-09 DIAGNOSIS — S92515A Nondisplaced fracture of proximal phalanx of left lesser toe(s), initial encounter for closed fracture: Secondary | ICD-10-CM | POA: Diagnosis not present

## 2021-07-09 DIAGNOSIS — I5032 Chronic diastolic (congestive) heart failure: Secondary | ICD-10-CM | POA: Diagnosis not present

## 2021-07-09 DIAGNOSIS — Z8673 Personal history of transient ischemic attack (TIA), and cerebral infarction without residual deficits: Secondary | ICD-10-CM | POA: Insufficient documentation

## 2021-07-09 DIAGNOSIS — I130 Hypertensive heart and chronic kidney disease with heart failure and stage 1 through stage 4 chronic kidney disease, or unspecified chronic kidney disease: Secondary | ICD-10-CM | POA: Insufficient documentation

## 2021-07-09 DIAGNOSIS — Z87891 Personal history of nicotine dependence: Secondary | ICD-10-CM | POA: Diagnosis not present

## 2021-07-09 DIAGNOSIS — Z7389 Other problems related to life management difficulty: Secondary | ICD-10-CM | POA: Insufficient documentation

## 2021-07-09 DIAGNOSIS — J45909 Unspecified asthma, uncomplicated: Secondary | ICD-10-CM | POA: Diagnosis not present

## 2021-07-09 DIAGNOSIS — J449 Chronic obstructive pulmonary disease, unspecified: Secondary | ICD-10-CM | POA: Insufficient documentation

## 2021-07-09 DIAGNOSIS — R4182 Altered mental status, unspecified: Secondary | ICD-10-CM | POA: Insufficient documentation

## 2021-07-09 DIAGNOSIS — E1122 Type 2 diabetes mellitus with diabetic chronic kidney disease: Secondary | ICD-10-CM | POA: Insufficient documentation

## 2021-07-09 DIAGNOSIS — I2510 Atherosclerotic heart disease of native coronary artery without angina pectoris: Secondary | ICD-10-CM | POA: Insufficient documentation

## 2021-07-09 DIAGNOSIS — N184 Chronic kidney disease, stage 4 (severe): Secondary | ICD-10-CM | POA: Diagnosis present

## 2021-07-09 DIAGNOSIS — R410 Disorientation, unspecified: Secondary | ICD-10-CM | POA: Diagnosis not present

## 2021-07-09 DIAGNOSIS — G8929 Other chronic pain: Secondary | ICD-10-CM

## 2021-07-09 DIAGNOSIS — N1832 Chronic kidney disease, stage 3b: Secondary | ICD-10-CM | POA: Diagnosis present

## 2021-07-09 DIAGNOSIS — Z79899 Other long term (current) drug therapy: Secondary | ICD-10-CM | POA: Insufficient documentation

## 2021-07-09 DIAGNOSIS — Z7951 Long term (current) use of inhaled steroids: Secondary | ICD-10-CM | POA: Diagnosis not present

## 2021-07-09 DIAGNOSIS — X58XXXA Exposure to other specified factors, initial encounter: Secondary | ICD-10-CM | POA: Diagnosis not present

## 2021-07-09 DIAGNOSIS — E860 Dehydration: Secondary | ICD-10-CM

## 2021-07-09 DIAGNOSIS — Z789 Other specified health status: Secondary | ICD-10-CM

## 2021-07-09 DIAGNOSIS — M79605 Pain in left leg: Secondary | ICD-10-CM | POA: Diagnosis not present

## 2021-07-09 DIAGNOSIS — S92502A Displaced unspecified fracture of left lesser toe(s), initial encounter for closed fracture: Secondary | ICD-10-CM

## 2021-07-09 DIAGNOSIS — I10 Essential (primary) hypertension: Secondary | ICD-10-CM | POA: Diagnosis not present

## 2021-07-09 DIAGNOSIS — R531 Weakness: Secondary | ICD-10-CM | POA: Diagnosis not present

## 2021-07-09 DIAGNOSIS — F22 Delusional disorders: Secondary | ICD-10-CM | POA: Diagnosis present

## 2021-07-09 DIAGNOSIS — F32A Depression, unspecified: Secondary | ICD-10-CM | POA: Diagnosis present

## 2021-07-09 HISTORY — DX: Syncope and collapse: R55

## 2021-07-09 LAB — CBC
HCT: 32.9 % — ABNORMAL LOW (ref 36.0–46.0)
Hemoglobin: 10.2 g/dL — ABNORMAL LOW (ref 12.0–15.0)
MCH: 27.6 pg (ref 26.0–34.0)
MCHC: 31 g/dL (ref 30.0–36.0)
MCV: 89.2 fL (ref 80.0–100.0)
Platelets: 313 10*3/uL (ref 150–400)
RBC: 3.69 MIL/uL — ABNORMAL LOW (ref 3.87–5.11)
RDW: 15.7 % — ABNORMAL HIGH (ref 11.5–15.5)
WBC: 3.8 10*3/uL — ABNORMAL LOW (ref 4.0–10.5)
nRBC: 0 % (ref 0.0–0.2)

## 2021-07-09 LAB — COMPREHENSIVE METABOLIC PANEL
ALT: 17 U/L (ref 0–44)
AST: 26 U/L (ref 15–41)
Albumin: 3.5 g/dL (ref 3.5–5.0)
Alkaline Phosphatase: 178 U/L — ABNORMAL HIGH (ref 38–126)
Anion gap: 8 (ref 5–15)
BUN: 25 mg/dL — ABNORMAL HIGH (ref 8–23)
CO2: 22 mmol/L (ref 22–32)
Calcium: 9.5 mg/dL (ref 8.9–10.3)
Chloride: 114 mmol/L — ABNORMAL HIGH (ref 98–111)
Creatinine, Ser: 1.39 mg/dL — ABNORMAL HIGH (ref 0.44–1.00)
GFR, Estimated: 43 mL/min — ABNORMAL LOW (ref >60)
Glucose, Bld: 85 mg/dL (ref 70–99)
Potassium: 3.5 mmol/L (ref 3.5–5.1)
Sodium: 144 mmol/L (ref 135–145)
Total Bilirubin: 0.5 mg/dL (ref 0.3–1.2)
Total Protein: 7 g/dL (ref 6.5–8.1)

## 2021-07-09 LAB — BLOOD GAS, VENOUS
Acid-base deficit: 2 mmol/L (ref 0.0–2.0)
Bicarbonate: 23.1 mmol/L (ref 20.0–28.0)
O2 Saturation: 84.5 %
Patient temperature: 37
pCO2, Ven: 40 mmHg — ABNORMAL LOW (ref 44–60)
pH, Ven: 7.37 (ref 7.25–7.43)
pO2, Ven: 52 mmHg — ABNORMAL HIGH (ref 32–45)

## 2021-07-09 LAB — OSMOLALITY: Osmolality: 300 mOsm/kg — ABNORMAL HIGH (ref 275–295)

## 2021-07-09 LAB — CK: Total CK: 250 U/L — ABNORMAL HIGH (ref 38–234)

## 2021-07-09 LAB — TROPONIN I (HIGH SENSITIVITY)
Troponin I (High Sensitivity): 5 ng/L (ref <18)
Troponin I (High Sensitivity): 4 ng/L (ref <18)

## 2021-07-09 LAB — AMMONIA: Ammonia: 37 umol/L — ABNORMAL HIGH (ref 9–35)

## 2021-07-09 LAB — LIPASE, BLOOD: Lipase: 25 U/L (ref 11–51)

## 2021-07-09 LAB — ACETAMINOPHEN LEVEL: Acetaminophen (Tylenol), Serum: <10 ug/mL — ABNORMAL LOW (ref 10–30)

## 2021-07-09 LAB — GLUCOSE, CAPILLARY: Glucose-Capillary: 87 mg/dL (ref 70–99)

## 2021-07-09 LAB — SALICYLATE LEVEL: Salicylate Lvl: <7 mg/dL — ABNORMAL LOW (ref 7.0–30.0)

## 2021-07-09 LAB — BRAIN NATRIURETIC PEPTIDE: B Natriuretic Peptide: 43.3 pg/mL (ref 0.0–100.0)

## 2021-07-09 LAB — ETHANOL: Alcohol, Ethyl (B): <10 mg/dL (ref <10)

## 2021-07-09 MED ORDER — ACETAMINOPHEN 325 MG PO TABS: 650.0000 mg | ORAL_TABLET | Freq: Four times a day (QID) | ORAL | Status: DC | PRN

## 2021-07-09 MED ORDER — SODIUM CHLORIDE 0.9 % IV BOLUS
500.0000 mL | Freq: Once | INTRAVENOUS | Status: AC
  Administered 2021-07-09: 500 mL via INTRAVENOUS

## 2021-07-09 MED ORDER — HEPARIN SODIUM (PORCINE) 5000 UNIT/ML IJ SOLN
5000.0000 [IU] | Freq: Three times a day (TID) | INTRAMUSCULAR | Status: DC
  Administered 2021-07-09 – 2021-07-12 (×8): 5000 [IU] via SUBCUTANEOUS
  Filled 2021-07-09 (×8): qty 1

## 2021-07-09 MED ORDER — INSULIN ASPART 100 UNIT/ML IJ SOLN
0.0000 [IU] | Freq: Every day | INTRAMUSCULAR | Status: DC
  Filled 2021-07-09: qty 0.05

## 2021-07-09 MED ORDER — INSULIN ASPART 100 UNIT/ML IJ SOLN
0.0000 [IU] | Freq: Three times a day (TID) | INTRAMUSCULAR | Status: DC
  Filled 2021-07-09: qty 0.09

## 2021-07-09 MED ORDER — SODIUM CHLORIDE 0.9 % IV SOLN: INTRAVENOUS | Status: DC

## 2021-07-09 MED ORDER — ACETAMINOPHEN 650 MG RE SUPP: 650.0000 mg | Freq: Four times a day (QID) | RECTAL | Status: DC | PRN

## 2021-07-09 NOTE — ED Triage Notes (Signed)
Pt BIB by family via personal vehicle. Pt c/o bilateral leg pain and weakness. States that she has not been the same since previous admission. Denies chills, fever, alert and oriented with confusions

## 2021-07-09 NOTE — Progress Notes (Signed)
Orthopedic Tech Progress Note Patient Details:  Tiffany [name] May 12, 1960 [id number]  Ortho Devices Type of Ortho Device: Ace wrap, Buddy tape, Postop shoe/boot Ortho Device/Splint Location: left Ortho Device/Splint Interventions: Application   Post Interventions Patient Tolerated: Well Instructions Provided: Care of device  Saul Fordyce 07/09/2021, 6:34 PM

## 2021-07-10 ENCOUNTER — Observation Stay (HOSPITAL_BASED_OUTPATIENT_CLINIC_OR_DEPARTMENT_OTHER): Payer: Medicare Other

## 2021-07-10 DIAGNOSIS — Z7951 Long term (current) use of inhaled steroids: Secondary | ICD-10-CM | POA: Diagnosis not present

## 2021-07-10 DIAGNOSIS — N183 Chronic kidney disease, stage 3 unspecified: Secondary | ICD-10-CM | POA: Diagnosis not present

## 2021-07-10 DIAGNOSIS — S92502A Displaced unspecified fracture of left lesser toe(s), initial encounter for closed fracture: Secondary | ICD-10-CM | POA: Diagnosis not present

## 2021-07-10 DIAGNOSIS — R55 Syncope and collapse: Secondary | ICD-10-CM

## 2021-07-10 DIAGNOSIS — R197 Diarrhea, unspecified: Secondary | ICD-10-CM | POA: Diagnosis not present

## 2021-07-10 DIAGNOSIS — R296 Repeated falls: Secondary | ICD-10-CM

## 2021-07-10 DIAGNOSIS — N1832 Chronic kidney disease, stage 3b: Secondary | ICD-10-CM

## 2021-07-10 DIAGNOSIS — E1122 Type 2 diabetes mellitus with diabetic chronic kidney disease: Secondary | ICD-10-CM | POA: Diagnosis not present

## 2021-07-10 DIAGNOSIS — I130 Hypertensive heart and chronic kidney disease with heart failure and stage 1 through stage 4 chronic kidney disease, or unspecified chronic kidney disease: Secondary | ICD-10-CM | POA: Diagnosis not present

## 2021-07-10 DIAGNOSIS — Z7389 Other problems related to life management difficulty: Secondary | ICD-10-CM | POA: Diagnosis not present

## 2021-07-10 DIAGNOSIS — Z20822 Contact with and (suspected) exposure to covid-19: Secondary | ICD-10-CM | POA: Diagnosis not present

## 2021-07-10 DIAGNOSIS — I2510 Atherosclerotic heart disease of native coronary artery without angina pectoris: Secondary | ICD-10-CM | POA: Diagnosis not present

## 2021-07-10 DIAGNOSIS — J449 Chronic obstructive pulmonary disease, unspecified: Secondary | ICD-10-CM | POA: Diagnosis not present

## 2021-07-10 DIAGNOSIS — S92515A Nondisplaced fracture of proximal phalanx of left lesser toe(s), initial encounter for closed fracture: Secondary | ICD-10-CM | POA: Diagnosis not present

## 2021-07-10 DIAGNOSIS — Z87891 Personal history of nicotine dependence: Secondary | ICD-10-CM | POA: Diagnosis not present

## 2021-07-10 DIAGNOSIS — E1140 Type 2 diabetes mellitus with diabetic neuropathy, unspecified: Secondary | ICD-10-CM | POA: Diagnosis not present

## 2021-07-10 DIAGNOSIS — Z8673 Personal history of transient ischemic attack (TIA), and cerebral infarction without residual deficits: Secondary | ICD-10-CM | POA: Diagnosis not present

## 2021-07-10 DIAGNOSIS — I5032 Chronic diastolic (congestive) heart failure: Secondary | ICD-10-CM | POA: Diagnosis not present

## 2021-07-10 DIAGNOSIS — F22 Delusional disorders: Secondary | ICD-10-CM | POA: Diagnosis not present

## 2021-07-10 DIAGNOSIS — Z79899 Other long term (current) drug therapy: Secondary | ICD-10-CM | POA: Diagnosis not present

## 2021-07-10 DIAGNOSIS — J45909 Unspecified asthma, uncomplicated: Secondary | ICD-10-CM | POA: Diagnosis not present

## 2021-07-10 LAB — ECHOCARDIOGRAM COMPLETE
AR max vel: 2.18 cm2
AV Area VTI: 2.17 cm2
AV Area mean vel: 2.06 cm2
AV Mean grad: 5 mmHg
AV Peak grad: 8.2 mmHg
Ao pk vel: 1.43 m/s
Area-P 1/2: 3.83 cm2
S' Lateral: 3 cm

## 2021-07-10 LAB — GLUCOSE, CAPILLARY
Glucose-Capillary: 89 mg/dL (ref 70–99)
Glucose-Capillary: 72 mg/dL (ref 70–99)
Glucose-Capillary: 75 mg/dL (ref 70–99)
Glucose-Capillary: 87 mg/dL (ref 70–99)

## 2021-07-10 LAB — BASIC METABOLIC PANEL
Anion gap: 6 (ref 5–15)
BUN: 20 mg/dL (ref 8–23)
CO2: 23 mmol/L (ref 22–32)
Calcium: 8.8 mg/dL — ABNORMAL LOW (ref 8.9–10.3)
Chloride: 115 mmol/L — ABNORMAL HIGH (ref 98–111)
Creatinine, Ser: 1.27 mg/dL — ABNORMAL HIGH (ref 0.44–1.00)
GFR, Estimated: 48 mL/min — ABNORMAL LOW (ref >60)
Glucose, Bld: 92 mg/dL (ref 70–99)
Potassium: 3.1 mmol/L — ABNORMAL LOW (ref 3.5–5.1)
Sodium: 144 mmol/L (ref 135–145)

## 2021-07-10 LAB — CBC
HCT: 32.3 % — ABNORMAL LOW (ref 36.0–46.0)
Hemoglobin: 9.8 g/dL — ABNORMAL LOW (ref 12.0–15.0)
MCH: 27.1 pg (ref 26.0–34.0)
MCHC: 30.3 g/dL (ref 30.0–36.0)
MCV: 89.5 fL (ref 80.0–100.0)
Platelets: 272 10*3/uL (ref 150–400)
RBC: 3.61 MIL/uL — ABNORMAL LOW (ref 3.87–5.11)
RDW: 15.6 % — ABNORMAL HIGH (ref 11.5–15.5)
WBC: 3.2 10*3/uL — ABNORMAL LOW (ref 4.0–10.5)
nRBC: 0 % (ref 0.0–0.2)

## 2021-07-10 LAB — CK: Total CK: 159 U/L (ref 38–234)

## 2021-07-10 MED ORDER — PANTOPRAZOLE SODIUM 40 MG PO TBEC
40.0000 mg | DELAYED_RELEASE_TABLET | Freq: Every day | ORAL | Status: DC
  Administered 2021-07-10 – 2021-07-12 (×3): 40 mg via ORAL
  Filled 2021-07-10 (×3): qty 1

## 2021-07-10 MED ORDER — ASENAPINE MALEATE 5 MG SL SUBL
10.0000 mg | SUBLINGUAL_TABLET | Freq: Two times a day (BID) | SUBLINGUAL | Status: DC
  Administered 2021-07-10 – 2021-07-12 (×5): 10 mg via SUBLINGUAL
  Filled 2021-07-10 (×5): qty 2

## 2021-07-10 MED ORDER — AMLODIPINE BESYLATE 10 MG PO TABS
10.0000 mg | ORAL_TABLET | Freq: Every day | ORAL | Status: DC
  Administered 2021-07-10 – 2021-07-11 (×2): 10 mg via ORAL
  Filled 2021-07-10 (×2): qty 1

## 2021-07-10 MED ORDER — CARVEDILOL 6.25 MG PO TABS
6.2500 mg | ORAL_TABLET | Freq: Two times a day (BID) | ORAL | Status: DC
  Administered 2021-07-10 – 2021-07-12 (×4): 6.25 mg via ORAL
  Filled 2021-07-10 (×4): qty 1

## 2021-07-10 MED ORDER — THIAMINE HCL 100 MG PO TABS
100.0000 mg | ORAL_TABLET | Freq: Every day | ORAL | Status: DC
  Administered 2021-07-10 – 2021-07-12 (×3): 100 mg via ORAL
  Filled 2021-07-10 (×3): qty 1

## 2021-07-10 MED ORDER — ATORVASTATIN CALCIUM 10 MG PO TABS
20.0000 mg | ORAL_TABLET | Freq: Every day | ORAL | Status: DC
  Administered 2021-07-10 – 2021-07-12 (×3): 20 mg via ORAL
  Filled 2021-07-10 (×3): qty 2

## 2021-07-10 MED ORDER — FEBUXOSTAT 40 MG PO TABS
40.0000 mg | ORAL_TABLET | Freq: Every morning | ORAL | Status: DC
  Administered 2021-07-10 – 2021-07-12 (×3): 40 mg via ORAL
  Filled 2021-07-10 (×3): qty 1

## 2021-07-10 MED ORDER — DULOXETINE HCL 30 MG PO CPEP
30.0000 mg | ORAL_CAPSULE | Freq: Every morning | ORAL | Status: DC
  Administered 2021-07-10 – 2021-07-12 (×3): 30 mg via ORAL
  Filled 2021-07-10 (×3): qty 1

## 2021-07-10 MED ORDER — UMECLIDINIUM BROMIDE 62.5 MCG/ACT IN AEPB
1.0000 | INHALATION_SPRAY | Freq: Every day | RESPIRATORY_TRACT | Status: DC
  Administered 2021-07-11: 1 via RESPIRATORY_TRACT
  Filled 2021-07-10: qty 7

## 2021-07-10 MED ORDER — LOPERAMIDE HCL 2 MG PO CAPS
4.0000 mg | ORAL_CAPSULE | Freq: Three times a day (TID) | ORAL | Status: AC
  Administered 2021-07-10 (×2): 4 mg via ORAL
  Filled 2021-07-10 (×2): qty 2

## 2021-07-10 MED ORDER — PRAZOSIN HCL 1 MG PO CAPS
2.0000 mg | ORAL_CAPSULE | Freq: Every day | ORAL | Status: DC
  Administered 2021-07-10 – 2021-07-11 (×2): 2 mg via ORAL
  Filled 2021-07-10 (×2): qty 2

## 2021-07-10 MED ORDER — POTASSIUM CHLORIDE CRYS ER 20 MEQ PO TBCR
40.0000 meq | EXTENDED_RELEASE_TABLET | Freq: Once | ORAL | Status: AC
  Administered 2021-07-10: 40 meq via ORAL
  Filled 2021-07-10: qty 2

## 2021-07-10 MED ORDER — PREGABALIN 75 MG PO CAPS
75.0000 mg | ORAL_CAPSULE | Freq: Two times a day (BID) | ORAL | Status: DC
  Administered 2021-07-10 – 2021-07-12 (×5): 75 mg via ORAL
  Filled 2021-07-10 (×5): qty 1

## 2021-07-10 MED ORDER — DICYCLOMINE HCL 10 MG PO CAPS
10.0000 mg | ORAL_CAPSULE | Freq: Three times a day (TID) | ORAL | Status: DC
  Administered 2021-07-10 – 2021-07-12 (×5): 10 mg via ORAL
  Filled 2021-07-10 (×7): qty 1

## 2021-07-10 MED ORDER — LOPERAMIDE HCL 2 MG PO CAPS
4.0000 mg | ORAL_CAPSULE | Freq: Three times a day (TID) | ORAL | Status: DC | PRN
  Administered 2021-07-10: 4 mg via ORAL
  Filled 2021-07-10: qty 2

## 2021-07-10 MED ORDER — OXYCODONE HCL 5 MG PO TABS
5.0000 mg | ORAL_TABLET | Freq: Four times a day (QID) | ORAL | Status: DC | PRN
  Administered 2021-07-10 – 2021-07-12 (×4): 5 mg via ORAL
  Filled 2021-07-10 (×4): qty 1

## 2021-07-10 MED ORDER — LISDEXAMFETAMINE DIMESYLATE 10 MG PO CAPS: 10.0000 mg | ORAL_CAPSULE | Freq: Every morning | ORAL | Status: DC

## 2021-07-10 MED ORDER — METHOCARBAMOL 500 MG PO TABS
500.0000 mg | ORAL_TABLET | Freq: Four times a day (QID) | ORAL | Status: DC | PRN
  Administered 2021-07-10: 500 mg via ORAL
  Filled 2021-07-10: qty 1

## 2021-07-10 MED ORDER — FLUTICASONE FUROATE-VILANTEROL 100-25 MCG/ACT IN AEPB
1.0000 | INHALATION_SPRAY | Freq: Every day | RESPIRATORY_TRACT | Status: DC
  Administered 2021-07-11: 1 via RESPIRATORY_TRACT
  Filled 2021-07-10: qty 28

## 2021-07-10 MED ORDER — OXYBUTYNIN CHLORIDE 5 MG PO TABS
5.0000 mg | ORAL_TABLET | Freq: Two times a day (BID) | ORAL | Status: DC
  Administered 2021-07-10 – 2021-07-12 (×5): 5 mg via ORAL
  Filled 2021-07-10 (×5): qty 1

## 2021-07-10 MED ORDER — MONTELUKAST SODIUM 10 MG PO TABS
10.0000 mg | ORAL_TABLET | Freq: Every day | ORAL | Status: DC
  Administered 2021-07-10 – 2021-07-11 (×2): 10 mg via ORAL
  Filled 2021-07-10 (×2): qty 1

## 2021-07-10 NOTE — Evaluation (Signed)
Occupational Therapy Evaluation Patient Details Name: Tiffany [name] MRN: [id number] DOB: January 23, 1960 Today's Date: 07/10/2021   History of Present Illness 61 y.o. female past medical history significant for anoxic brain injury, osteoarthritis asthma binge eating disorder, coronary artery disease, chronic diastolic heart failure, chronic kidney disease stage III chronic pain syndrome posttraumatic stress disorder, COPD not on oxygen, polysubstance abuse specially cocaine, diabetes mellitus type 2, history of NSTEMI, schizoaffective disorder, history of nonhemorrhagic stroke admitted through ED 2* ongoing bilt LE pain and inability to care for self in current living situation. On imaging in ED, pt with L foot third proximal phalanx fx.   Clinical Impression   Patient is a 61 year old female who was admitted for above. Patient was noted to have multiple admissions to the hospital within the last 6 months. Patient's eval was limited by increased pain in BLE with attempts to sit on edge of bed with R lateral and posterior leaning. Patient unable to tolerate standing attempt on this date with increased pain. Patient was noted to have decreased functional activity tolerance, decreased endurance, decreased standing balance, decreased safety awareness, and decreased knowledge of AD/AE impacting participation in ADLs.  Patient would continue to benefit from skilled OT services at this time while admitted and after d/c to address noted deficits in order to improve overall safety and independence in ADLs.       Recommendations for follow up therapy are one component of a multi-disciplinary discharge planning process, led by the attending physician.  Recommendations may be updated based on patient status, additional functional criteria and insurance authorization.   Follow Up Recommendations  Skilled nursing-short term rehab (<3 hours/day)    Assistance Recommended at Discharge Frequent or constant  Supervision/Assistance  Patient can return home with the following Two people to help with walking and/or transfers;Direct supervision/assist for medications management;Direct supervision/assist for financial management;Assist for transportation;Help with stairs or ramp for entrance;Assistance with cooking/housework;A lot of help with bathing/dressing/bathroom    Functional Status Assessment  Patient has had a recent decline in their functional status and/or demonstrates limited ability to make significant improvements in function in a reasonable and predictable amount of time  Equipment Recommendations  Hospital bed    Recommendations for Other Services       Precautions / Restrictions Precautions Precautions: Fall Precaution Comments: back precautions due to cauda equina Required Braces or Orthoses: Other Brace Other Brace: post op shoe for L foot 3rd prox phalanx fx Restrictions Weight Bearing Restrictions: No      Mobility Bed Mobility Overal bed mobility: Needs Assistance Bed Mobility: Supine to Sit, Sit to Supine Rolling: Mod assist   Supine to sit: Mod assist, +2 for physical assistance, +2 for safety/equipment Sit to supine: Max assist, +2 for physical assistance, +2 for safety/equipment   General bed mobility comments: Increased time with assist for LE management, to control trunk and to comlete rotation using bed pad    Transfers                          Balance Overall balance assessment: Needs assistance Sitting-balance support: Bilateral upper extremity supported, Feet supported Sitting balance-Leahy Scale: Poor Sitting balance - Comments: leans to R side with resting on elbow. patient reproted " this is the way to reduce pain" noted to have twisting in back with edcation on precautions. Postural control: Right lateral lean, Posterior lean     Standing balance comment: unable  ADL either performed or assessed  with clinical judgement   ADL Overall ADL's : Needs assistance/impaired Eating/Feeding: Set up;Bed level   Grooming: Set up;Sitting   Upper Body Bathing: Set up;Sitting   Lower Body Bathing: Maximal assistance;Bed level   Upper Body Dressing : Set up;Sitting   Lower Body Dressing: Total assistance;Bed level   Toilet Transfer: Total assistance Toilet Transfer Details (indicate cue type and reason): patient was able to engage in sitting EOB with max A x2 for supine to sit on edge of bed with increased time. patient noted to have continued lateral lean to R side with sitting with continued education on how this goes against back preacutions. patient reported that " this is what makes my back feel better". patient unable to toelrate sitting EOB long and declined attempts to stand on this date. posterior leaning noted as well on this date. Toileting- Clothing Manipulation and Hygiene: Total assistance       Functional mobility during ADLs: +2 for physical assistance;+2 for safety/equipment       Vision Baseline Vision/History: 1 Wears glasses Patient Visual Report: No change from baseline       Perception     Praxis      Pertinent Vitals/Pain Pain Assessment Pain Assessment: Faces Faces Pain Scale: Hurts even more Pain Location: lower extremities with movement Pain Descriptors / Indicators: Grimacing, Guarding Pain Intervention(s): Limited activity within patient's tolerance, Monitored during session, Patient requesting pain meds-RN notified, Repositioned     Hand Dominance Right   Extremity/Trunk Assessment Upper Extremity Assessment Upper Extremity Assessment: Generalized weakness (ROM WFL patient able to use BUE to pull self up in the bed. no formal MMT testing with back preacutions and pain)   Lower Extremity Assessment Lower Extremity Assessment: Defer to PT evaluation RLE: Unable to fully assess due to pain LLE Deficits / Details: post op shoe noted on L  foot LLE: Unable to fully assess due to pain   Cervical / Trunk Assessment Cervical / Trunk Assessment: Normal   Communication Communication Communication: No difficulties   Cognition Arousal/Alertness: Awake/alert Behavior During Therapy: Flat affect Overall Cognitive Status: No family/caregiver present to determine baseline cognitive functioning Area of Impairment: Following commands, Safety/judgement, Memory                       Following Commands: Follows one step commands with increased time Safety/Judgement: Decreased awareness of safety, Decreased awareness of deficits   Problem Solving: Slow processing, Decreased initiation, Requires verbal cues, Requires tactile cues, Difficulty sequencing General Comments: appears AxO x 3 selective with which questions she will answer.     General Comments       Exercises     Shoulder Instructions      Home Living Family/patient expects to be discharged to:: Skilled nursing facility                                 Additional Comments: Pt offering minimal information regarding current living arrangement but states she plans to go for rehab following hospital stay      Prior Functioning/Environment Prior Level of Function : Needs assist             Mobility Comments: Pt reports "many" falls at home ADLs Comments: Able to perform ADLs at bed level or seated level - needs assistance for LB ADLs        OT Problem List: Decreased strength;Decreased  activity tolerance;Impaired balance (sitting and/or standing);Decreased cognition;Decreased safety awareness;Pain;Decreased knowledge of use of DME or AE;Decreased knowledge of precautions      OT Treatment/Interventions: Self-care/ADL training;Therapeutic exercise;Balance training;Patient/family education;Cognitive remediation/compensation;Therapeutic activities    OT Goals(Current goals can be found in the care plan section) Acute Rehab OT Goals Patient  Stated Goal: to fix pain medications OT Goal Formulation: With patient Time For Goal Achievement: 07/24/21 Potential to Achieve Goals: Fair  OT Frequency: Min 2X/week    Co-evaluation PT/OT/SLP Co-Evaluation/Treatment: Yes Reason for Co-Treatment: For patient/therapist safety;To address functional/ADL transfers PT goals addressed during session: Mobility/safety with mobility OT goals addressed during session: ADL's and self-care      AM-PAC OT "6 Clicks" Daily Activity     Outcome Measure Help from another person eating meals?: A Little Help from another person taking care of personal grooming?: A Little Help from another person toileting, which includes using toliet, bedpan, or urinal?: Total Help from another person bathing (including washing, rinsing, drying)?: A Lot Help from another person to put on and taking off regular upper body clothing?: A Little Help from another person to put on and taking off regular lower body clothing?: Total 6 Click Score: 13   End of Session Nurse Communication: Patient requests pain meds  Activity Tolerance: Patient limited by pain Patient left: in bed;with call bell/phone within reach;with bed alarm set  OT Visit Diagnosis: Other abnormalities of gait and mobility (R26.89);Muscle weakness (generalized) (M62.81)                Time: 1100-1116 OT Time Calculation (min): 16 min Charges:  OT General Charges $OT Visit: 1 Visit OT Evaluation $OT Eval Moderate Complexity: 1 Mod  Sharyn Blitz OTR/L, MS Acute Rehabilitation Department Office# [phone] Pager# [phone]   Ardyth Harps 07/10/2021, 1:35 PM
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
## 2021-07-11 ENCOUNTER — Observation Stay (HOSPITAL_COMMUNITY)
Admit: 2021-07-11 | Discharge: 2021-07-11 | Disposition: A | Discharge status: Home / Self Care | Payer: Medicare Other | Attending: Internal Medicine | Admitting: Internal Medicine

## 2021-07-11 ENCOUNTER — Ambulatory Visit: Payer: Self-pay | Admitting: *Deleted

## 2021-07-11 DIAGNOSIS — I2510 Atherosclerotic heart disease of native coronary artery without angina pectoris: Secondary | ICD-10-CM | POA: Diagnosis not present

## 2021-07-11 DIAGNOSIS — J449 Chronic obstructive pulmonary disease, unspecified: Secondary | ICD-10-CM | POA: Diagnosis not present

## 2021-07-11 DIAGNOSIS — Z7389 Other problems related to life management difficulty: Secondary | ICD-10-CM | POA: Diagnosis not present

## 2021-07-11 DIAGNOSIS — F22 Delusional disorders: Secondary | ICD-10-CM

## 2021-07-11 DIAGNOSIS — R4182 Altered mental status, unspecified: Secondary | ICD-10-CM

## 2021-07-11 DIAGNOSIS — I130 Hypertensive heart and chronic kidney disease with heart failure and stage 1 through stage 4 chronic kidney disease, or unspecified chronic kidney disease: Secondary | ICD-10-CM | POA: Diagnosis not present

## 2021-07-11 DIAGNOSIS — I5032 Chronic diastolic (congestive) heart failure: Secondary | ICD-10-CM | POA: Diagnosis not present

## 2021-07-11 DIAGNOSIS — E1140 Type 2 diabetes mellitus with diabetic neuropathy, unspecified: Secondary | ICD-10-CM | POA: Diagnosis not present

## 2021-07-11 DIAGNOSIS — Z20822 Contact with and (suspected) exposure to covid-19: Secondary | ICD-10-CM | POA: Diagnosis not present

## 2021-07-11 DIAGNOSIS — R197 Diarrhea, unspecified: Secondary | ICD-10-CM

## 2021-07-11 DIAGNOSIS — N1832 Chronic kidney disease, stage 3b: Secondary | ICD-10-CM | POA: Diagnosis not present

## 2021-07-11 DIAGNOSIS — E1122 Type 2 diabetes mellitus with diabetic chronic kidney disease: Secondary | ICD-10-CM | POA: Diagnosis not present

## 2021-07-11 DIAGNOSIS — Z8673 Personal history of transient ischemic attack (TIA), and cerebral infarction without residual deficits: Secondary | ICD-10-CM | POA: Diagnosis not present

## 2021-07-11 DIAGNOSIS — S92515A Nondisplaced fracture of proximal phalanx of left lesser toe(s), initial encounter for closed fracture: Secondary | ICD-10-CM | POA: Diagnosis not present

## 2021-07-11 DIAGNOSIS — N183 Chronic kidney disease, stage 3 unspecified: Secondary | ICD-10-CM | POA: Diagnosis not present

## 2021-07-11 DIAGNOSIS — Z79899 Other long term (current) drug therapy: Secondary | ICD-10-CM | POA: Diagnosis not present

## 2021-07-11 DIAGNOSIS — J45909 Unspecified asthma, uncomplicated: Secondary | ICD-10-CM | POA: Diagnosis not present

## 2021-07-11 DIAGNOSIS — Z7951 Long term (current) use of inhaled steroids: Secondary | ICD-10-CM | POA: Diagnosis not present

## 2021-07-11 DIAGNOSIS — R296 Repeated falls: Secondary | ICD-10-CM | POA: Diagnosis not present

## 2021-07-11 DIAGNOSIS — Z87891 Personal history of nicotine dependence: Secondary | ICD-10-CM | POA: Diagnosis not present

## 2021-07-11 DIAGNOSIS — S92502A Displaced unspecified fracture of left lesser toe(s), initial encounter for closed fracture: Secondary | ICD-10-CM | POA: Diagnosis not present

## 2021-07-11 DIAGNOSIS — R55 Syncope and collapse: Secondary | ICD-10-CM | POA: Diagnosis not present

## 2021-07-11 LAB — BASIC METABOLIC PANEL
Anion gap: 5 (ref 5–15)
BUN: 16 mg/dL (ref 8–23)
CO2: 25 mmol/L (ref 22–32)
Calcium: 8.9 mg/dL (ref 8.9–10.3)
Chloride: 112 mmol/L — ABNORMAL HIGH (ref 98–111)
Creatinine, Ser: 1.14 mg/dL — ABNORMAL HIGH (ref 0.44–1.00)
GFR, Estimated: 54 mL/min — ABNORMAL LOW (ref >60)
Glucose, Bld: 81 mg/dL (ref 70–99)
Potassium: 4 mmol/L (ref 3.5–5.1)
Sodium: 142 mmol/L (ref 135–145)

## 2021-07-11 LAB — CBC
HCT: 32.2 % — ABNORMAL LOW (ref 36.0–46.0)
Hemoglobin: 9.9 g/dL — ABNORMAL LOW (ref 12.0–15.0)
MCH: 27.4 pg (ref 26.0–34.0)
MCHC: 30.7 g/dL (ref 30.0–36.0)
MCV: 89.2 fL (ref 80.0–100.0)
Platelets: 259 10*3/uL (ref 150–400)
RBC: 3.61 MIL/uL — ABNORMAL LOW (ref 3.87–5.11)
RDW: 15.4 % (ref 11.5–15.5)
WBC: 3.5 10*3/uL — ABNORMAL LOW (ref 4.0–10.5)
nRBC: 0 % (ref 0.0–0.2)

## 2021-07-11 LAB — GLUCOSE, CAPILLARY
Glucose-Capillary: 88 mg/dL (ref 70–99)
Glucose-Capillary: 99 mg/dL (ref 70–99)
Glucose-Capillary: 86 mg/dL (ref 70–99)
Glucose-Capillary: 72 mg/dL (ref 70–99)

## 2021-07-11 LAB — MAGNESIUM: Magnesium: 1.4 mg/dL — ABNORMAL LOW (ref 1.7–2.4)

## 2021-07-11 MED ORDER — SACCHAROMYCES BOULARDII 250 MG PO CAPS
250.0000 mg | ORAL_CAPSULE | Freq: Two times a day (BID) | ORAL | Status: DC
  Administered 2021-07-11 – 2021-07-12 (×3): 250 mg via ORAL
  Filled 2021-07-11 (×2): qty 1

## 2021-07-11 MED ORDER — MAGNESIUM SULFATE 4 GM/100ML IV SOLN
4.0000 g | Freq: Once | INTRAVENOUS | Status: AC
Start: 2021-07-11 — End: 2021-07-12
  Administered 2021-07-11: 4 g via INTRAVENOUS
  Filled 2021-07-11: qty 100

## 2021-07-11 NOTE — Progress Notes (Signed)
This Clinical research associate noticed patient had 2 bags of home  medications with her. Medications were packed into a sealed security bag with patient's consent and were dropped off in pharmacy. Patient also gabapentin and valium pills which were counted with pharmacy tech and sealed in a different bag . Patient was made aware that she will get her medications back during discharge.
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
## 2021-07-12 ENCOUNTER — Telehealth: Payer: Self-pay | Admitting: *Deleted

## 2021-07-12 ENCOUNTER — Telehealth: Payer: Self-pay | Admitting: Physical Medicine and Rehabilitation

## 2021-07-12 ENCOUNTER — Ambulatory Visit: Payer: Self-pay | Admitting: *Deleted

## 2021-07-12 DIAGNOSIS — Z79899 Other long term (current) drug therapy: Secondary | ICD-10-CM | POA: Diagnosis not present

## 2021-07-12 DIAGNOSIS — J45909 Unspecified asthma, uncomplicated: Secondary | ICD-10-CM | POA: Diagnosis not present

## 2021-07-12 DIAGNOSIS — R531 Weakness: Secondary | ICD-10-CM | POA: Diagnosis not present

## 2021-07-12 DIAGNOSIS — I2510 Atherosclerotic heart disease of native coronary artery without angina pectoris: Secondary | ICD-10-CM | POA: Diagnosis not present

## 2021-07-12 DIAGNOSIS — Z7951 Long term (current) use of inhaled steroids: Secondary | ICD-10-CM | POA: Diagnosis not present

## 2021-07-12 DIAGNOSIS — E1122 Type 2 diabetes mellitus with diabetic chronic kidney disease: Secondary | ICD-10-CM | POA: Diagnosis not present

## 2021-07-12 DIAGNOSIS — Z7389 Other problems related to life management difficulty: Secondary | ICD-10-CM | POA: Diagnosis not present

## 2021-07-12 DIAGNOSIS — N183 Chronic kidney disease, stage 3 unspecified: Secondary | ICD-10-CM | POA: Diagnosis not present

## 2021-07-12 DIAGNOSIS — E1140 Type 2 diabetes mellitus with diabetic neuropathy, unspecified: Secondary | ICD-10-CM | POA: Diagnosis not present

## 2021-07-12 DIAGNOSIS — Z7401 Bed confinement status: Secondary | ICD-10-CM | POA: Diagnosis not present

## 2021-07-12 DIAGNOSIS — R296 Repeated falls: Secondary | ICD-10-CM | POA: Diagnosis not present

## 2021-07-12 DIAGNOSIS — J449 Chronic obstructive pulmonary disease, unspecified: Secondary | ICD-10-CM | POA: Diagnosis not present

## 2021-07-12 DIAGNOSIS — Z8673 Personal history of transient ischemic attack (TIA), and cerebral infarction without residual deficits: Secondary | ICD-10-CM | POA: Diagnosis not present

## 2021-07-12 DIAGNOSIS — R404 Transient alteration of awareness: Secondary | ICD-10-CM | POA: Diagnosis not present

## 2021-07-12 DIAGNOSIS — Z87891 Personal history of nicotine dependence: Secondary | ICD-10-CM | POA: Diagnosis not present

## 2021-07-12 DIAGNOSIS — S92515A Nondisplaced fracture of proximal phalanx of left lesser toe(s), initial encounter for closed fracture: Secondary | ICD-10-CM | POA: Diagnosis not present

## 2021-07-12 DIAGNOSIS — R55 Syncope and collapse: Secondary | ICD-10-CM | POA: Diagnosis not present

## 2021-07-12 DIAGNOSIS — I130 Hypertensive heart and chronic kidney disease with heart failure and stage 1 through stage 4 chronic kidney disease, or unspecified chronic kidney disease: Secondary | ICD-10-CM | POA: Diagnosis not present

## 2021-07-12 DIAGNOSIS — Z20822 Contact with and (suspected) exposure to covid-19: Secondary | ICD-10-CM | POA: Diagnosis not present

## 2021-07-12 DIAGNOSIS — I5032 Chronic diastolic (congestive) heart failure: Secondary | ICD-10-CM | POA: Diagnosis not present

## 2021-07-12 DIAGNOSIS — N1832 Chronic kidney disease, stage 3b: Secondary | ICD-10-CM | POA: Diagnosis not present

## 2021-07-12 LAB — BASIC METABOLIC PANEL
Anion gap: 5 (ref 5–15)
BUN: 17 mg/dL (ref 8–23)
CO2: 26 mmol/L (ref 22–32)
Calcium: 9.1 mg/dL (ref 8.9–10.3)
Chloride: 110 mmol/L (ref 98–111)
Creatinine, Ser: 1.49 mg/dL — ABNORMAL HIGH (ref 0.44–1.00)
GFR, Estimated: 39 mL/min — ABNORMAL LOW (ref >60)
Glucose, Bld: 103 mg/dL — ABNORMAL HIGH (ref 70–99)
Potassium: 4.1 mmol/L (ref 3.5–5.1)
Sodium: 141 mmol/L (ref 135–145)

## 2021-07-12 LAB — GLUCOSE, CAPILLARY: Glucose-Capillary: 101 mg/dL — ABNORMAL HIGH (ref 70–99)

## 2021-07-12 LAB — MAGNESIUM: Magnesium: 2 mg/dL (ref 1.7–2.4)

## 2021-07-12 MED ORDER — DIAZEPAM 5 MG PO TABS: 5.0000 mg | ORAL_TABLET | Freq: Every day | ORAL | 0 refills | Status: DC | PRN

## 2021-07-12 MED ORDER — LOPERAMIDE HCL 2 MG PO CAPS: 4.0000 mg | ORAL_CAPSULE | Freq: Three times a day (TID) | ORAL | 0 refills | Status: DC | PRN

## 2021-07-12 MED ORDER — SACCHAROMYCES BOULARDII 250 MG PO CAPS: 250.0000 mg | ORAL_CAPSULE | Freq: Two times a day (BID) | ORAL | 0 refills | Status: AC

## 2021-07-12 MED ORDER — OXYCODONE HCL 5 MG PO TABS
5.0000 mg | ORAL_TABLET | Freq: Four times a day (QID) | ORAL | 0 refills | Status: DC | PRN
Start: 2021-07-12 — End: 2021-07-18

## 2021-07-12 NOTE — Discharge Summary (Addendum)
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 06/16/2021 13:52   DG Pelvis Portable  Result Date: 06/13/2021 CLINICAL DATA:  Pain. EXAM: PORTABLE PELVIS 1-2 VIEWS COMPARISON:  CT abdomen and pelvis 05/20/2021 FINDINGS: There is no evidence of pelvic fracture or diastasis. No pelvic bone lesions are seen. Bones are osteopenic. There are mild degenerative changes of the hips. Soft tissues are within normal limits. IMPRESSION: 1. No acute bony abnormality. 2. Mild degenerative changes of the hips. 3. Diffuse osteopenia. Electronically Signed   By: Darliss Cheney M.D.   On: 06/13/2021 22:53    DISCHARGE EXAMINATION: Vitals:   07/11/21 1330 07/11/21 1650 07/11/21 2052 07/12/21 0532  BP: (!) 137/92 (!) 133/94 (!) 128/91 122/82  Pulse: 73 76 82 80  Resp: 14  16   Temp: 98.2 F (36.8 C)  97.9 F (36.6 C) 98.2 F (36.8 C)  TempSrc: Oral  Oral Oral  SpO2: 95%  96% 98% 93%  Weight:      Height:       General appearance: Awake alert.  In no  distress Resp: Clear to auscultation bilaterally.  Normal effort Cardio: S1-S2 is normal regular.  No S3-S4.  No rubs murmurs or bruit GI: Abdomen is soft.  Nontender nondistended.  Bowel sounds are present normal.  No masses organomegaly   DISPOSITION: SNF  Discharge Instructions     Ambulatory referral to Orthopedic Surgery   Complete by: As directed    Left 3rd proximal phalanx fracture   Call MD for:  difficulty breathing, headache or visual disturbances   Complete by: As directed    Call MD for:  extreme fatigue   Complete by: As directed    Call MD for:  persistant dizziness or light-headedness   Complete by: As directed    Call MD for:  persistant nausea and vomiting   Complete by: As directed    Call MD for:  severe uncontrolled pain   Complete by: As directed    Call MD for:  temperature >100.4   Complete by: As directed    Diet - low sodium heart healthy   Complete by: As directed    Discharge instructions   Complete by: As directed    You will need to follow-up with orthopedics for your phalanx fracture.  A referral has been sent.  Please contact your primary care provider if you do not hear from orthopedics.  You were cared for by a hospitalist during your hospital stay. If you have any questions about your discharge medications or the care you received while you were in the hospital after you are discharged, you can call the unit and asked to speak with the hospitalist on call if the hospitalist that took care of you is not available. Once you are discharged, your primary care physician will handle any further medical issues. Please note that NO REFILLS for any discharge medications will be authorized once you are discharged, as it is imperative that you return to your primary care physician (or establish a relationship with a primary care physician if you do not have one) for your aftercare needs so that they can reassess your need for medications and monitor your lab  values. If you do not have a primary care physician, you can call [phone] for a physician referral.   Increase activity slowly   Complete by: As directed           Allergies as of 07/12/2021       Reactions   Hydrocodone Shortness Of Breath   Latuda [lurasidone Hcl] Anaphylaxis   Magnesium-containing Compounds Anaphylaxis   Tolerated Ensure   Prednisone Anaphylaxis, Swelling, Other (See Comments)   Tongue swelling, lip swelling, throat swelling, per pt    Tramadol Anaphylaxis, Swelling   Codeine Nausea And Vomiting   Trazodone Other (See Comments)   paranoia   Topamax [topiramate] Other (See Comments)   Increases paranoia   Sulfa Antibiotics Itching   Tape Rash        Medication List     STOP taking these medications    diclofenac 75 MG EC tablet Commonly known as: VOLTAREN   escitalopram 20 MG tablet Commonly known as: LEXAPRO   thiamine 100 MG tablet       TAKE these medications    albuterol 108 (90 Base) MCG/ACT inhaler Commonly known as: VENTOLIN HFA Inhale 2 puffs into the lungs every 6 (six) hours as needed for  wheezing or shortness of breath.   amLODipine 5 MG tablet Commonly known as: NORVASC Take 1 tablet (5 mg total) by mouth at bedtime. What changed: how much to take   Asenapine Maleate 10 MG Subl Place 1 tablet (10 mg total) under the tongue 2 (two) times daily.   atorvastatin 20 MG tablet Commonly known as: LIPITOR Take 1 tablet (20 mg total) by mouth daily.   carvedilol 6.25 MG tablet Commonly known as: COREG Take 1 tablet (6.25 mg total) by mouth 2 (two) times daily with a meal.   colchicine 0.6 MG tablet Take 2 tablets by mouth at the onset of gout flare, may repeat 1 tablet in 1 hour if symptoms persist What changed:  how much to take how to take this when to take this additional instructions   diazepam 5 MG tablet Commonly known as: VALIUM Take 1 tablet (5 mg total) by mouth daily as needed for anxiety. What changed:   when to take this reasons to take this   diclofenac Sodium 1 % Gel Commonly known as: Voltaren Apply 4 g topically 4 (four) times daily as needed (pain). What changed:  how much to take when to take this   dicyclomine 10 MG capsule Commonly known as: Bentyl Take 1 capsule (10 mg total) by mouth 3 (three) times daily before meals.   DULoxetine 30 MG capsule Commonly known as: CYMBALTA Take 30 mg by mouth every morning.   febuxostat 40 MG tablet Commonly known as: ULORIC Take 40 mg by mouth every morning.   Iron (Ferrous Sulfate) 325 (65 Fe) MG Tabs Take 325 mg by mouth daily.   loperamide 2 MG capsule Commonly known as: IMODIUM Take 2 capsules (4 mg total) by mouth 3 (three) times daily as needed for diarrhea or loose stools.   methocarbamol 500 MG tablet Commonly known as: ROBAXIN Take 500 mg by mouth in the morning and at bedtime.   montelukast 10 MG tablet Commonly known as: SINGULAIR Take 1 tablet (10 mg total) by mouth at bedtime.   omeprazole 40 MG capsule Commonly known as: PRILOSEC Take 1 capsule (40 mg total) by mouth every morning.   oxybutynin 5 MG tablet Commonly known as: DITROPAN Take 1 tablet (5 mg total) by mouth 2 (two) times daily.   oxyCODONE 5 MG immediate release tablet Commonly known as: Oxy IR/ROXICODONE Take 1 tablet (5 mg total) by mouth every 6 (six) hours as needed for severe pain.   potassium chloride SA 20 MEQ tablet Commonly known as: KLOR-CON M Take 1 tablet (20 mEq total) by mouth daily.   prazosin 2 MG capsule Commonly known as: MINIPRESS Take 1 capsule (2 mg total) by mouth at bedtime. TAKE 1 CAPSULE(2 MG) BY MOUTH AT BEDTIME Strength: 2 mg   pregabalin 75 MG capsule Commonly known as: LYRICA Take 75 mg by mouth 2 (two) times daily.   saccharomyces boulardii 250 MG capsule Commonly known as: FLORASTOR Take 1 capsule (250 mg total) by mouth 2 (two) times daily for 14 days.   terbinafine 1 % cream Commonly known as:  LamISIL AT Apply 1 application  topically 2 (two) times daily.   torsemide 20 MG tablet Commonly known as: DEMADEX Take 1 tablet (20 mg total) by mouth daily.   Trelegy Ellipta 100-62.5-25 MCG/ACT Aepb Generic drug: Fluticasone-Umeclidin-Vilant Inhale 1 puff into the lungs daily.   Vyvanse 10 MG capsule Generic drug: lisdexamfetamine Take 10 mg by mouth every morning.  Follow-up Information     Hoy Register, MD. Schedule an appointment as soon as possible for a visit in 2 week(s).   Specialty: Family Medicine Contact information: [address] [place] [address] [phone]                 TOTAL DISCHARGE TIME: 35 minutes  Tylah Mancillas Rito Ehrlich  [facility] Pager on [email or web address]  07/12/2021, 9:45 AM

## 2021-07-12 NOTE — TOC Transition Note (Signed)
Transition of Care [facility]) - CM/SW Discharge Note   Patient Details  Name: Tiffany [name] MRN: [id number] Date of Birth: 03-01-1960  Transition of Care [facility]) CM/SW Contact:  Otelia Santee, LCSW Phone Number: 07/12/2021, 10:23 AM   Clinical Narrative:    Pt is to return home with HHPT/OT services through [facility]. No DME needs identified. No further TOC needs at this time.   Final next level of care: Home w Home Health Services Barriers to Discharge: Barriers Resolved   Patient Goals and CMS Choice Patient states their goals for this hospitalization and ongoing recovery are:: To return home   Choice offered to / list presented to : Patient  Discharge Placement                       Discharge Plan and Services In-house Referral: Clinical Social Work, [facility] Discharge Planning Services: CM Consult Post Acute Care Choice: Skilled Nursing Facility          DME Arranged: N/A DME Agency: NA       HH Arranged: PT, OT HH Agency: [facility] Date [facility] The Agency Contacted: 07/12/21 Time HH Agency Contacted: 1021 Representative spoke with at [facility] Agency: Denyse Amass  Social Determinants of Health (SDOH) Interventions     Readmission Risk Interventions    05/24/2021    6:19 PM 07/26/2020    2:21 PM 02/23/2020    1:16 PM  Readmission Risk Prevention Plan  Transportation Screening Complete Complete Complete  Medication Review [facility]) Referral to Pharmacy Complete Complete  PCP or Specialist appointment within 3-5 days of discharge Complete Complete Complete  HRI or Home Care Consult Complete Complete Complete  SW Recovery Care/Counseling Consult Complete Complete Complete  Palliative Care Screening  Not Applicable Complete  Skilled Nursing Facility Complete Complete Complete

## 2021-07-12 NOTE — TOC Progression Note (Signed)
Transition of Care [facility]) - Progression Note    Patient Details  Name: [name] MRN: [id number] Date of Birth: 11-17-1960  Transition of Care [facility]) CM/SW Contact  Otelia Santee, LCSW Phone Number: 07/12/2021, 9:14 AM  Clinical Narrative:    [name] contacted [facility] regarding insurance authorization for this pt. [facility] is now stating they are unable to accept this pt at their facility. Pt has no other bed offers for SNF placement. [name] will explore [facility] services for this pt as pt not accepted for SNF placement.    Expected Discharge Plan: Skilled Nursing Facility Barriers to Discharge: Continued Medical Work up, SNF Pending bed offer  Expected Discharge Plan and Services Expected Discharge Plan: Skilled Nursing Facility In-house Referral: Clinical Social Work, [facility] Discharge Planning Services: CM Consult Post Acute Care Choice: Skilled Nursing Facility Living arrangements for the past 2 months: Apartment Expected Discharge Date: 07/12/21               DME Arranged: N/A                     Social Determinants of Health (SDOH) Interventions    Readmission Risk Interventions    05/24/2021    6:19 PM 07/26/2020    2:21 PM 02/23/2020    1:16 PM  Readmission Risk Prevention Plan  Transportation Screening Complete Complete Complete  Medication Review [facility]) Referral to Pharmacy Complete Complete  PCP or Specialist appointment within 3-5 days of discharge Complete Complete Complete  HRI or Home Care Consult Complete Complete Complete  SW Recovery Care/Counseling Consult Complete Complete Complete  Palliative Care Screening  Not Applicable Complete  Skilled Nursing Facility Complete Complete Complete

## 2021-07-13 ENCOUNTER — Other Ambulatory Visit: Payer: Self-pay | Admitting: *Deleted

## 2021-07-13 ENCOUNTER — Telehealth: Payer: Self-pay

## 2021-07-13 NOTE — Telephone Encounter (Signed)
From the discharge call:  she said she is working with an agency to help her "get thing together."  They will provide home therapy as well as doctors who will make house calls. She was not sure what she would need to see Dr Margarita Rana about and again said she is working with another agency.  When asked what the name of the agency is, she said she is not talking about it.    I received a message from [facility] and she has contacted Dr Altamese Dilling office to see if he would make a house calls.

## 2021-07-13 NOTE — Patient Outreach (Signed)
[facility]) Care Management Telephonic RN Care Manager Note   07/13/2021 Name:  Tiffany [name] MRN:  [id number] DOB:  1960-08-09  Summary: Tiffany [name] call placed to member, successful.  Aware that she will be getting calls from [facility] as well as Dr. Altamese Dilling office.  Denies any urgent concerns, encouraged to contact this care manager with questions.     Subjective: Tiffany [name] is an 61 y.o. year old female who is a primary patient of [name], MD. The care management team was consulted for assistance with care management and/or care coordination needs.    Telephonic RN Care Manager completed Telephone Visit today.  Objective:   Medications Reviewed Today     Reviewed by Otilio Connors, CPhT (Pharmacy Technician) on 07/09/21 at 2103  Med List Status: Complete   Medication Order Taking? Sig Documenting Provider Last Dose Status Informant  albuterol (VENTOLIN HFA) 108 (90 Base) MCG/ACT inhaler [id number] Yes Inhale 2 puffs into the lungs every 6 (six) hours as needed for wheezing or shortness of breath. [name], MD 07/08/2021 Active Self  amLODipine (NORVASC) 5 MG tablet [id number] Yes Take 1 tablet (5 mg total) by mouth at bedtime.  Patient taking differently: Take 10 mg by mouth at bedtime.   [name], MD 07/09/2021 Active Self           Med Note Jimmey Ralph, [facility] I   Sat Jul 09, 2021  8:53 PM)    Asenapine Maleate 10 MG SUBL [id number] Yes Place 1 tablet (10 mg total) under the tongue 2 (two) times daily. Shelly Coss, MD 07/09/2021 Active Self           Med Note Daisy Lazar Jun 26, 2021  8:28 AM)    atorvastatin (LIPITOR) 20 MG tablet [id number] Yes Take 1 tablet (20 mg total) by mouth daily. [name], MD 07/09/2021 Active Self  carvedilol (COREG) 6.25 MG tablet [id number] Yes Take 1 tablet (6.25 mg total) by mouth 2 (two) times daily with a meal. [name], MD 07/09/2021 Active Self  colchicine 0.6 MG tablet  [id number] Yes Take 2 tablets by mouth at the onset of gout flare, may repeat 1 tablet in 1 hour if symptoms persist  Patient taking differently: Take 0.6 mg by mouth daily.   [name], MD 07/09/2021 Active Self  diazepam (VALIUM) 5 MG tablet [id number] Yes Take 5 mg by mouth in the morning. [provider] 07/09/2021 Active Self           Med Note Daisy Lazar Jun 26, 2021  8:41 AM)    diclofenac (VOLTAREN) 75 MG EC tablet [id number] Yes Take 75 mg by mouth 2 (two) times daily. [provider] 07/09/2021 Active Self           Med Note Jimmey Ralph, [facility] I   Sat Jul 09, 2021  8:56 PM)    diclofenac Sodium (VOLTAREN) 1 % GEL [id number] Yes Apply 4 g topically 4 (four) times daily as needed (pain).  Patient taking differently: Apply 1 application  topically 3 (three) times daily.   [name], MD 07/08/2021 Active Self  dicyclomine (BENTYL) 10 MG capsule [id number] Yes Take 1 capsule (10 mg total) by mouth 3 (three) times daily before meals. [name], MD 07/09/2021 Active Self  DULoxetine (CYMBALTA) 30 MG capsule [id number] Yes Take 30 mg by mouth every morning. [provider] 07/08/2021 Active Self  Med Note Jimmey Ralph, [facility]   Sat Jul 09, 2021  8:58 PM)    escitalopram (LEXAPRO) 20 MG tablet [id number] Yes Take 20 mg by mouth every morning. [provider] 07/08/2021 Active Self           Med Note Daisy Lazar Jun 26, 2021  8:41 AM)    febuxostat (ULORIC) 40 MG tablet [id number] Yes Take 40 mg by mouth every morning. [provider] 07/08/2021 Active Self           Med Note Daisy Lazar Jun 26, 2021  8:33 AM)    Fluticasone-Umeclidin-Vilant (TRELEGY ELLIPTA) 100-62.5-25 MCG/ACT AEPB [id number] Yes Inhale 1 puff into the lungs daily. [name], MD 07/09/2021 Active Self  Iron, Ferrous Sulfate, 325 (65 Fe) MG TABS [id number] Yes Take 325 mg by mouth daily. [name], MD 07/08/2021  Active Self  methocarbamol (ROBAXIN) 500 MG tablet [id number] Yes Take 500 mg by mouth in the morning and at bedtime. [provider] 07/08/2021 Active Self  montelukast (SINGULAIR) 10 MG tablet [id number] Yes Take 1 tablet (10 mg total) by mouth at bedtime. Mercy Riding, MD 07/08/2021 Active Self           Med Note Jimmey Ralph, [facility] I   Sat Jul 09, 2021  9:01 PM)    omeprazole (PRILOSEC) 40 MG capsule [id number] Yes Take 1 capsule (40 mg total) by mouth every morning. [name], MD 07/08/2021 Active Self  oxybutynin (DITROPAN) 5 MG tablet [id number] Yes Take 1 tablet (5 mg total) by mouth 2 (two) times daily. [name], MD 07/08/2021 Active Self  potassium chloride SA (KLOR-CON M) 20 MEQ tablet [id number] Yes Take 1 tablet (20 mEq total) by mouth daily. [name], MD 07/08/2021 Active Self  prazosin (MINIPRESS) 2 MG capsule [id number] Yes Take 1 capsule (2 mg total) by mouth at bedtime. TAKE 1 CAPSULE(2 MG) BY MOUTH AT BEDTIME Strength: 2 mg [name], MD 07/08/2021 Active Self  pregabalin (LYRICA) 75 MG capsule [id number] Yes Take 75 mg by mouth 2 (two) times daily. [provider] Past Week Active Self           Med Note Orvan Seen, [facility]   Fri Jun 17, 2021  8:24 AM) #60 filled 05/15/21 per bottle (#24 left)  terbinafine (LAMISIL AT) 1 % cream [id number] Yes Apply 1 application  topically 2 (two) times daily. [name], MD 07/08/2021 Active Self  thiamine 100 MG tablet [id number] No Take 1 tablet (100 mg total) by mouth daily.  Patient not taking: Reported on 06/17/2021   Modena Jansky, MD Not Taking Active Self           Med Note Orvan Seen, [facility]   Fri Jun 17, 2021  8:43 AM) Not seen in bag with pt's other medications   torsemide (DEMADEX) 20 MG tablet [id number] Yes Take 1 tablet (20 mg total) by mouth daily. [name], MD 07/08/2021 Active Self  VYVANSE 10 MG capsule [id number] Yes Take 10 mg by mouth every morning. [provider]  07/08/2021 Active Self  Med List Note Latina Craver, [facility] 05/19/20 1422): ,             SDOH:  (Social Determinants of Health) assessments and interventions performed:     Care Plan  Review of patient past medical history, allergies, medications, health status, including review of consultants reports, laboratory and other test data, was performed as part of comprehensive evaluation for  care management services.   Care Plan : [facility] Plan of Care (Adult)  Updates made by [name], RN since 07/13/2021 12:00 AM     Problem: Care coordination needs related to multiple comorbidities   Priority: High     Long-Range Goal: Member will be able to adequately verbalize management of chronic comorbidities and have additional support in the home   Start Date: 06/23/2021  Expected End Date: 06/24/2022  This Visit's Progress: Not on track  Recent Progress: On track  Priority: High  Note:   Current Barriers:  Knowledge Deficits related to plan of care for management of CHF, COPD, and Bipolar Disorder  Care Coordination needs related to Limited social support, Transportation, Limited access to food, Level of care concerns, ADL IADL limitations, Mental Health Concerns , and Inability to perform ADL's independently Lacks caregiver support Literacy barriers Transportation barriers  RNCM Clinical Goal(s):  Patient will verbalize understanding of plan for management of CHF, COPD, and Bipolar Disorder as evidenced by Patient verbalizing adequate plan of care continue to work with RN Care Manager to address care management and care coordination needs related to  CHF, COPD, and Bipolar Disorder as evidenced by adherence to CM Team Scheduled appointments work with pharmacist to address Limited social support, Transportation, Limited access to food, Level of care concerns, and ADL IADL limitations related toCHF, COPD, and Bipolar Disorder as evidenced by review or EMR and patient or pharmacist  report work with Education officer, museum to address  related to the management of Limited social support, Transportation, Limited access to food, Level of care concerns, and ADL IADL limitations related to the management of CHF, COPD, and Bipolar Disorder as evidenced by review of EMR and patient or Education officer, museum report work with Data processing manager care guide to address needs related to  Limited social support, Transportation, Limited access to food, Level of care concerns, and ADL IADL limitations as evidenced by patient and/or community resource care guide support work with [facility] to establish in home primary care as evidenced by reported engagement by [employer] not experience hospital admission as evidenced by review of EMR. Hospital Admissions in last 6 months = 5 experience decrease in ED visits as evidenced by EMR review.  ED visits in in last 6 months = 20  through collaboration with RN Care manager, provider, and care team.   Interventions: Inter-disciplinary care team collaboration (see longitudinal plan of care) Evaluation of current treatment plan related to  self management and patient's adherence to plan as established by provider   Heart Failure Interventions:  (Status:  Goal on track:  Yes.) Long Term Goal Provided education on low sodium diet Reviewed Heart Failure Action Plan in depth and provided written copy  COPD Interventions:  (Status:  Goal on track:  Yes.) Long Term Goal Provided patient with basic written and verbal COPD education on self care/management/and exacerbation prevention Provided written and verbal instructions on pursed lip breathing and utilized returned demonstration as teach back  Patient Goals/Self-Care Activities: Take all medications as prescribed Attend all scheduled provider appointments keep legs up while sitting use salt in moderation develop a rescue plan eliminate smoking in my home develop a rescue plan follow rescue plan if symptoms  flare-up  Follow Up Plan:  The patient has been provided with contact information for the care management team and has been advised to call with any health related questions or concerns.    Update 6/21 - Member advised that [name] has attempted to contact her  in regards to psych follow up, provider will follow up next week.  Member has been to the ED in the last week for leg pain, eloped prior to being seen.  [name] very sleepy today, difficult to understand, state she will call back.  Call placed to [facility] home health, confirms PT and OT has started but no other disciplines.  Advised that RN, SW, and aide should all be ordered.  Confirmed order was placed, they will add services to contract.     Update 6/28 - Readmitted to hospital 6/24-6/27 with AMS.  SNF was recommended again, but there was an issues with getting her placed.  There were initially 2 bed offers however they both were rescinded, unknown reason.  She was discharged home with home health through [facility].  Spoke with member today, she has not received call from [facility] yet, but aware that they will be calling.  Remains unable to walk with no assistance in the home.  Recommended to have follow up with PCP within 2 weeks, call placed to Dr. Altamese Dilling office to request they take member as a new patient for home visits.  Demographics sent over to office (fax [phone]).  Member also aware that she will continue to have telephonic follow up from [facility], [name], and Pharmacist).          Plan:  Telephone follow up appointment with care management team member scheduled for:  1 week The patient has been provided with contact information for the care management team and has been advised to call with any health related questions or concerns.   [name], RN, MSN, [place] Manager [phone]

## 2021-07-13 NOTE — Telephone Encounter (Signed)
Transition Care Management Follow-up Telephone Call Date of discharge and from where: 07/12/2021, [facility] How have you been since you were released from the hospital? She said" what do you want me to say, I just got home." Any questions or concerns? Yes- she said she is working with an agency to help her "get thing together."  They will provide home therapy as well as doctors who will make house calls. She was not sure what she would need to see Dr Margarita Rana about and again said she is working with another agency.  When asked what the name of the agency is, she said she is not talking about it.   Items Reviewed: Did the pt receive and understand the discharge instructions provided?  She didn't answer Medications obtained and verified?  She said she does not have everything but did not specify what she needed.  Other? No  Any new allergies since your discharge? No  Dietary orders reviewed? No Do you have support at home?  Lives alone.   Home Care and Equipment/Supplies: Were home health services ordered? yes If so, what is the name of the agency? [facility]  Has the agency set up a time to come to the patient's home? She didn't answer.  Were any new equipment or medical supplies ordered?  No What is the name of the medical supply agency? N/a Were you able to get the supplies/equipment? not applicable Do you have any questions related to the use of the equipment or supplies? No  Functional Questionnaire: (I = Independent and D = Dependent) ADLs: dependent.  A referral was made for PCS prior to her hospitalization    Follow up appointments reviewed:  PCP Hospital f/u appt confirmed?  Please refer to note above regarding follow up    [place] f/u appt confirmed?  None scheduled yet.    Are transportation arrangements needed? Yes - she is unable to get out of her home  If their condition worsens, is the pt aware to call PCP or go to the Emergency Dept.? Yes Was the patient  provided with contact information for the PCP's office or ED? Yes Was to pt encouraged to call back with questions or concerns? Yes

## 2021-07-14 ENCOUNTER — Other Ambulatory Visit: Payer: Self-pay | Admitting: Family Medicine

## 2021-07-14 ENCOUNTER — Other Ambulatory Visit: Payer: Self-pay | Admitting: *Deleted

## 2021-07-14 ENCOUNTER — Other Ambulatory Visit (HOSPITAL_COMMUNITY): Payer: Self-pay

## 2021-07-14 ENCOUNTER — Telehealth: Payer: Self-pay | Admitting: Pharmacist

## 2021-07-14 ENCOUNTER — Ambulatory Visit: Payer: Self-pay | Admitting: *Deleted

## 2021-07-14 NOTE — Telephone Encounter (Signed)
Requested medication (s) are due for refill today:   Provider to review   Requested medication (s) are on the active medication list:   Yes as historical for both  Future visit scheduled:   No   Seen 2 wks ago   Last ordered: 03/22/2021 historical;   Lyrica 05/15/2021 historical - looks like from hospital provider  Returned because both of these are historical medications.   Lyrica is non delegated.   Requested Prescriptions  Pending Prescriptions Disp Refills   DULoxetine (CYMBALTA) 30 MG capsule      Sig: Take 1 capsule (30 mg total) by mouth every morning.     Psychiatry: Antidepressants - SNRI - duloxetine Failed - 07/14/2021 12:39 PM      Failed - Cr in normal range and within 360 days    Creat  Date Value Ref Range Status  02/18/2016 1.22 (H) 0.50 - 1.05 mg/dL Final    Comment:      For patients > or = 62 years of age: The upper reference limit for Creatinine is approximately 13% higher for people identified as African-American.      Creatinine, Ser  Date Value Ref Range Status  07/12/2021 1.49 (H) 0.44 - 1.00 mg/dL Final   Creatinine,U  Date Value Ref Range Status  06/17/2012 521.94 mg/dL Final    Comment:    Result confirmed by automatic dilution.    Cutoff Values for Urine Drug Screen, Pain Mgmt          Drug Class           Cutoff (ng/mL)          Amphetamines             500          Barbiturates             200          Cocaine Metabolites      150          Benzodiazepines          200          Methadone                300          Opiates                  300          Phencyclidine             25          Propoxyphene             300          Marijuana Metabolites     50    For medical purposes only.   Creatinine, Urine  Date Value Ref Range Status  02/19/2020 300.84 mg/dL Final    Comment:    Performed at [facility], [place] [address] [place], [place] [address]         Passed - eGFR is 30 or above and within 360 days    GFR,  Est African American  Date Value Ref Range Status  02/18/2016 57 (L) >=60 mL/min Final   GFR calc Af Amer  Date Value Ref Range Status  11/25/2019 56 (L) >59 mL/min/1.73 Final    Comment:    **In accordance with recommendations from the NKF-ASN Task force,**   [facility] is in the process of updating its eGFR calculation  to the   2021 CKD-EPI creatinine equation that estimates kidney function   without a race variable.    GFR, Est Non African American  Date Value Ref Range Status  02/18/2016 50 (L) >=60 mL/min Final   GFR, Estimated  Date Value Ref Range Status  07/12/2021 39 (L) >60 mL/min Final    Comment:    (NOTE) Calculated using the CKD-EPI Creatinine Equation (2021)    eGFR  Date Value Ref Range Status  07/13/2020 21 (L) >59 mL/min/1.73 Final         Passed - Completed PHQ-2 or PHQ-9 in the last 360 days      Passed - Last BP in normal range    BP Readings from Last 1 Encounters:  07/12/21 122/82         Passed - Valid encounter within last 6 months    Recent Outpatient Visits           2 weeks ago Frequent falls   [place], Charlane Ferretti, MD   2 months ago Lumbar radiculopathy   [place], Enobong, MD   3 months ago Weakness of both lower extremities   [place], Charlane Ferretti, MD   4 months ago Edema of both legs   [place], Vernia Buff, NP   5 months ago Hospital discharge follow-up   [place], NP               pregabalin (LYRICA) 75 MG capsule      Sig: Take 1 capsule (75 mg total) by mouth 2 (two) times daily.     Not Delegated - Neurology:  Anticonvulsants - Controlled - pregabalin Failed - 07/14/2021 12:39 PM      Failed - This refill cannot be delegated      Failed - Cr in normal range and within 360 days    Creat  Date Value Ref Range Status   02/18/2016 1.22 (H) 0.50 - 1.05 mg/dL Final    Comment:      For patients > or = 62 years of age: The upper reference limit for Creatinine is approximately 13% higher for people identified as African-American.      Creatinine, Ser  Date Value Ref Range Status  07/12/2021 1.49 (H) 0.44 - 1.00 mg/dL Final   Creatinine,U  Date Value Ref Range Status  06/17/2012 521.94 mg/dL Final    Comment:    Result confirmed by automatic dilution.    Cutoff Values for Urine Drug Screen, Pain Mgmt          Drug Class           Cutoff (ng/mL)          Amphetamines             500          Barbiturates             200          Cocaine Metabolites      150          Benzodiazepines          200          Methadone                300          Opiates  300          Phencyclidine             25          Propoxyphene             300          Marijuana Metabolites     50    For medical purposes only.   Creatinine, Urine  Date Value Ref Range Status  02/19/2020 300.84 mg/dL Final    Comment:    Performed at [facility], [place] [address], [place], [place] [address]         Passed - Completed PHQ-2 or PHQ-9 in the last 360 days      Passed - Valid encounter within last 12 months    Recent Outpatient Visits           2 weeks ago Frequent falls   [place], Enobong, MD   2 months ago Lumbar radiculopathy   [place], Enobong, MD   3 months ago Weakness of both lower extremities   [name], Enobong, MD   4 months ago Edema of both legs   [place], Zelda W, NP   5 months ago Hospital discharge follow-up   [place] Gildardo Pounds, NP

## 2021-07-14 NOTE — Telephone Encounter (Signed)
Second request for Rx- sent for provider review.

## 2021-07-14 NOTE — Patient Outreach (Signed)
[facility]) Care Management  07/14/2021  Tiffany [name] 05-02-1960 [id number]   Incoming call received from member, stating she is out of several medications, including pain medication.  Advised that this care manage will collaborate with [facility] pharmacy team to follow up on reconciliation.  Member notified that [name] has been trying to reach her without success, provided with contact information and encouraged to call.  Will follow up with member within the next week as planned.  [name], [place], MSN [place] [phone]

## 2021-07-14 NOTE — Progress Notes (Signed)
Received call from Loretha Brasil,  from [facility] that Ms. Zogg notified her that she was discharged from [facility] on 07/12/21 without her home medications that were being stored in the Pharmacy for security.  Ms. Garretson stated that she would not be able to return to the hospital to pick them up until Monday 07/18/21, and Tiffany was concerned for her going that many days without her medications.  Tiffany stated that Claris Gladden from [facility] would be making a delivery to her home on Friday 07/15/21 and is willing to pick the medications up from [place] and then deliver to her home.  I received verbal permission from Ms. Mcnabb that medications can be given to [facility] if proper identification is provided for delivery to her home.  Peggyann Juba, PharmD, BCPS 07/14/2021 5:53 PM

## 2021-07-14 NOTE — Progress Notes (Signed)
[facility])  [place] Team    07/14/2021  Tiffany [name] 07-09-1960 [id number]  Reason for referral:  Missing home medications  Referral source: [facility] RN Current insurance: [facility]  Outreach:  Successful telephone call with Tiffany [name].  HIPAA identifiers verified.   Objective: The ASCVD Risk score (Arnett DK, et al., 2019) failed to calculate for the following reasons:   The patient has a prior MI or stroke diagnosis  Lab Results  Component Value Date   CREATININE 1.49 (H) 07/12/2021   CREATININE 1.14 (H) 07/11/2021   CREATININE 1.27 (H) 07/10/2021    Lab Results  Component Value Date   HGBA1C 5.1 06/17/2021    Lipid Panel     Component Value Date/Time   CHOL 127 08/05/2018 1748   TRIG 53 08/05/2018 1748   HDL 62 08/05/2018 1748   CHOLHDL 2.0 08/05/2018 1748   CHOLHDL 3.7 01/07/2017 0005   VLDL 38 01/07/2017 0005   LDLCALC 54 08/05/2018 1748   LDLDIRECT 52 04/05/2012 1210    BP Readings from Last 3 Encounters:  07/12/21 122/82  07/04/21 (!) 169/96  06/26/21 (!) 160/142    Allergies  Allergen Reactions   Hydrocodone Shortness Of Breath   Latuda [Lurasidone Hcl] Anaphylaxis   Magnesium-Containing Compounds Anaphylaxis    Tolerated Ensure   Prednisone Anaphylaxis, Swelling and Other (See Comments)    Tongue swelling, lip swelling, throat swelling, per pt    Tramadol Anaphylaxis and Swelling   Codeine Nausea And Vomiting   Trazodone Other (See Comments)    paranoia   Topamax [Topiramate] Other (See Comments)    Increases paranoia   Sulfa Antibiotics Itching   Tape Rash    Medications Reviewed Today     Reviewed by Otilio Connors, CPhT (Pharmacy Technician) on 07/09/21 at 2103  Med List Status: Complete   Medication Order Taking? Sig Documenting Provider Last Dose Status Informant  albuterol (VENTOLIN HFA) 108 (90 Base) MCG/ACT inhaler [id number] Yes Inhale 2 puffs into the lungs every 6 (six)  hours as needed for wheezing or shortness of breath. [name], MD 07/08/2021 Active Self  amLODipine (NORVASC) 5 MG tablet [id number] Yes Take 1 tablet (5 mg total) by mouth at bedtime.  Patient taking differently: Take 10 mg by mouth at bedtime.   [name], MD 07/09/2021 Active Self           Med Note Jimmey Ralph, [facility] I   Sat Jul 09, 2021  8:53 PM)    Asenapine Maleate 10 MG SUBL [id number] Yes Place 1 tablet (10 mg total) under the tongue 2 (two) times daily. Shelly Coss, MD 07/09/2021 Active Self           Med Note Daisy Lazar Jun 26, 2021  8:28 AM)    atorvastatin (LIPITOR) 20 MG tablet [id number] Yes Take 1 tablet (20 mg total) by mouth daily. [name], MD 07/09/2021 Active Self  carvedilol (COREG) 6.25 MG tablet [id number] Yes Take 1 tablet (6.25 mg total) by mouth 2 (two) times daily with a meal. [name], MD 07/09/2021 Active Self  colchicine 0.6 MG tablet [id number] Yes Take 2 tablets by mouth at the onset of gout flare, may repeat 1 tablet in 1 hour if symptoms persist  Patient taking differently: Take 0.6 mg by mouth daily.   [name], MD 07/09/2021 Active Self  diazepam (VALIUM) 5 MG tablet [id number] Yes Take 5 mg by mouth in the morning. [provider] 07/09/2021 Active Self           Med Note Daisy Lazar Jun 26, 2021  8:41 AM)    diclofenac (VOLTAREN) 75 MG EC tablet [id number] Yes Take 75 mg by mouth 2 (two) times daily. [provider] 07/09/2021 Active Self           Med Note Jimmey Ralph, [facility] I   Sat Jul 09, 2021  8:56 PM)    diclofenac Sodium (VOLTAREN) 1 % GEL [id number] Yes Apply 4 g topically 4 (four) times daily as needed (pain).  Patient taking differently: Apply 1 application  topically 3 (three) times daily.   [name], MD 07/08/2021 Active Self  dicyclomine (BENTYL) 10 MG capsule [id number] Yes Take 1 capsule (10 mg total) by mouth 3 (three) times daily before meals. [name],  MD 07/09/2021 Active Self  DULoxetine (CYMBALTA) 30 MG capsule [id number] Yes Take 30 mg by mouth every morning. [provider] 07/08/2021 Active Self           Med Note Jimmey Ralph, [facility] I   Sat Jul 09, 2021  8:58 PM)    escitalopram (LEXAPRO) 20 MG tablet [id number] Yes Take 20 mg by mouth every morning. [provider] 07/08/2021 Active Self           Med Note Daisy Lazar Jun 26, 2021  8:41 AM)    febuxostat (ULORIC) 40 MG tablet [id number] Yes Take 40 mg by mouth every morning. [provider] 07/08/2021 Active Self           Med Note Daisy Lazar Jun 26, 2021  8:33 AM)    Fluticasone-Umeclidin-Vilant (TRELEGY ELLIPTA) 100-62.5-25 MCG/ACT AEPB [id number] Yes Inhale 1 puff into the lungs daily. [name], MD 07/09/2021 Active Self  Iron, Ferrous Sulfate, 325 (65 Fe) MG TABS [id number] Yes Take 325 mg by mouth daily. [name], MD 07/08/2021 Active Self  methocarbamol (ROBAXIN) 500 MG tablet [id number] Yes Take 500 mg by mouth in the morning and at bedtime. [provider] 07/08/2021 Active Self  montelukast (SINGULAIR) 10 MG tablet [id number] Yes Take 1 tablet (10 mg total) by mouth at bedtime. Mercy Riding, MD 07/08/2021 Active Self           Med Note Jimmey Ralph, [facility] I   Sat Jul 09, 2021  9:01 PM)    omeprazole (PRILOSEC) 40 MG capsule [id number] Yes Take 1 capsule (40 mg total) by mouth every morning. [name], MD 07/08/2021 Active Self  oxybutynin (DITROPAN) 5 MG tablet [id number] Yes Take 1 tablet (5 mg total) by mouth 2 (two) times daily. [name], MD 07/08/2021 Active Self  potassium chloride SA (KLOR-CON M) 20 MEQ tablet [id number] Yes Take 1 tablet (20 mEq total) by mouth daily. [name], MD 07/08/2021 Active Self  prazosin (MINIPRESS) 2 MG capsule [id number] Yes Take 1 capsule (2 mg total) by mouth at bedtime. TAKE 1 CAPSULE(2 MG) BY MOUTH AT BEDTIME Strength: 2 mg [name], MD 07/08/2021 Active  Self  pregabalin (LYRICA) 75 MG capsule [id number] Yes Take 75 mg by mouth 2 (two) times daily. [provider] Past Week Active Self           Med Note Orvan Seen, [facility]   Fri Jun 17, 2021  8:24 AM) #60 filled 05/15/21 per bottle (#24 left)  terbinafine (LAMISIL AT) 1 % cream [id number] Yes Apply 1 application  topically 2 (two) times daily. [name], MD  07/08/2021 Active Self  thiamine 100 MG tablet [id number] No Take 1 tablet (100 mg total) by mouth daily.  Patient not taking: Reported on 06/17/2021   Modena Jansky, MD Not Taking Active Self           Med Note Orvan Seen, [facility]ense   Fri Jun 17, 2021  8:43 AM) Not seen in bag with pt's other medications   torsemide (DEMADEX) 20 MG tablet [id number] Yes Take 1 tablet (20 mg total) by mouth daily. [name], MD 07/08/2021 Active Self  VYVANSE 10 MG capsule [id number] Yes Take 10 mg by mouth every morning. [provider] 07/08/2021 Active Self  Med List Note Latina Craver, [facility] 05/19/20 1422): ,            Assessment: Telephone outreach with patient today. Ms. Martinek reports that she took her bag of home medications to [facility] on recent admission for observation and on discharge, 07/12/2021 her bag of home medications was not returned to her. Ms. Warr reported that she did not want anyone of her family members to pick up the medications for her. Patient's medications were filled recently and there will be a cash charge to the patient to refill the medications again early. The best way to get the patient her needed medications is to retrieve the medications from [place] and return them to the patient.   I placed call to [facility] and was able to verify that patient's medications was locked in a secure holding in the inpatient pharmacy. This is standard procedure for safe keeping of medications when a patient is admitted to hospital and patient does not have  a care giver to secure medications otherwise.   I placed a telephonic outreach to patient's community pharmacy, [facility]. I spoke with Max Sane advised that she would be making a home delivery to the patient's home tomorrow 07/15/2021 for routine prescription refill drop off and would be willing to pick up patient's home medications from [place] and drive them to the patient's home.   I placed telephone outreach to Ms. Golla and got verbal consent for Paula Libra. With [facility] to pick up her medications for her and Ms. Bowery consented. I returned call to [employer]. At [employer] and confirmed that Paula Libra. Will pick up the medications at the [facility] tomorrow at 12 noon. Tyson Alias was informed to take a valid identification to present.   I placed telephonic follow up to [facility] inpatient pharmacy to provide update about the arranged pick up of the medications. I spoke with Peggyann Juba, PharmD and provided the information. I proved the name, date and time of the arranged pick up. Junie Panning stated she would place a call to Ms. Savo to verify permission to release medications to [facility].   I have communicated the plan to [facility] CM.    Plan: Will follow-up in 1 to 3 business days.  Loretha Brasil, PharmD [place] Pharmacist Office: [phone]

## 2021-07-14 NOTE — Telephone Encounter (Signed)
Requested medication (s) are due for refill today - no  Requested medication (s) are on the active medication list -no  Future visit scheduled -no  Last refill: unknown  Notes to clinic: medication no longer on current medication list  Requested Prescriptions  Pending Prescriptions Disp Refills   diclofenac (VOLTAREN) 75 MG EC tablet [Pharmacy Med Name: DICLOFENAC SODIUM 75 MG ORAL TABLET DELAYED RELEASE] 60 tablet 1    Sig: TAKE 1 TABLET (75 MG) BY MOUTH TWICE DAILY AS NEEDED     Analgesics:  NSAIDS Failed - 07/14/2021  1:37 PM      Failed - Manual Review: Labs are only required if the patient has taken medication for more than 8 weeks.      Failed - Cr in normal range and within 360 days    Creat  Date Value Ref Range Status  02/18/2016 1.22 (H) 0.50 - 1.05 mg/dL Final    Comment:      For patients > or = 62 years of age: The upper reference limit for Creatinine is approximately 13% higher for people identified as African-American.      Creatinine, Ser  Date Value Ref Range Status  07/12/2021 1.49 (H) 0.44 - 1.00 mg/dL Final   Creatinine,U  Date Value Ref Range Status  06/17/2012 521.94 mg/dL Final    Comment:    Result confirmed by automatic dilution.    Cutoff Values for Urine Drug Screen, Pain Mgmt          Drug Class           Cutoff (ng/mL)          Amphetamines             500          Barbiturates             200          Cocaine Metabolites      150          Benzodiazepines          200          Methadone                300          Opiates                  300          Phencyclidine             25          Propoxyphene             300          Marijuana Metabolites     50    For medical purposes only.   Creatinine, Urine  Date Value Ref Range Status  02/19/2020 300.84 mg/dL Final    Comment:    Performed at [facility], [place] [address] [place], [place] [address]         Failed - HGB in normal range and within 360 days     Hemoglobin  Date Value Ref Range Status  07/11/2021 9.9 (L) 12.0 - 15.0 g/dL Final  11/25/2019 9.0 (L) 11.1 - 15.9 g/dL Final         Failed - HCT in normal range and within 360 days    HCT  Date Value Ref Range Status  07/11/2021 32.2 (L) 36.0 - 46.0 % Final   Hematocrit  Date Value Ref Range Status  11/25/2019 28.2 (L) 34.0 - 46.6 % Final         Passed - PLT in normal range and within 360 days    Platelets  Date Value Ref Range Status  07/11/2021 259 150 - 400 K/uL Final  11/25/2019 375 150 - 450 x10E3/uL Final         Passed - eGFR is 30 or above and within 360 days    GFR, Est African American  Date Value Ref Range Status  02/18/2016 57 (L) >=60 mL/min Final   GFR calc Af Amer  Date Value Ref Range Status  11/25/2019 56 (L) >59 mL/min/1.73 Final    Comment:    **In accordance with recommendations from the NKF-ASN Task force,**   [facility] is in the process of updating its eGFR calculation to the   2021 CKD-EPI creatinine equation that estimates kidney function   without a race variable.    GFR, Est Non African American  Date Value Ref Range Status  02/18/2016 50 (L) >=60 mL/min Final   GFR, Estimated  Date Value Ref Range Status  07/12/2021 39 (L) >60 mL/min Final    Comment:    (NOTE) Calculated using the CKD-EPI Creatinine Equation (2021)    eGFR  Date Value Ref Range Status  07/13/2020 21 (L) >59 mL/min/1.73 Final         Passed - Patient is not pregnant      Passed - Valid encounter within last 12 months    Recent Outpatient Visits           2 weeks ago Frequent falls   [place], Charlane Ferretti, MD   2 months ago Lumbar radiculopathy   [place], Charlane Ferretti, MD   3 months ago Weakness of both lower extremities   [place], Charlane Ferretti, MD   4 months ago Edema of both legs   [place], [place] W, NP   5  months ago Hospital discharge follow-up   [place], Vernia Buff, NP                 Requested Prescriptions  Pending Prescriptions Disp Refills   diclofenac (VOLTAREN) 75 MG EC tablet [Pharmacy Med Name: DICLOFENAC SODIUM 75 MG ORAL TABLET DELAYED RELEASE] 60 tablet 1    Sig: TAKE 1 TABLET (75 MG) BY MOUTH TWICE DAILY AS NEEDED     Analgesics:  NSAIDS Failed - 07/14/2021  1:37 PM      Failed - Manual Review: Labs are only required if the patient has taken medication for more than 8 weeks.      Failed - Cr in normal range and within 360 days    Creat  Date Value Ref Range Status  02/18/2016 1.22 (H) 0.50 - 1.05 mg/dL Final    Comment:      For patients > or = 62 years of age: The upper reference limit for Creatinine is approximately 13% higher for people identified as African-American.      Creatinine, Ser  Date Value Ref Range Status  07/12/2021 1.49 (H) 0.44 - 1.00 mg/dL Final   Creatinine,U  Date Value Ref Range Status  06/17/2012 521.94 mg/dL Final    Comment:    Result confirmed by automatic dilution.    Cutoff Values for Urine Drug Screen, Pain Mgmt  Drug Class           Cutoff (ng/mL)          Amphetamines             500          Barbiturates             200          Cocaine Metabolites      150          Benzodiazepines          200          Methadone                300          Opiates                  300          Phencyclidine             25          Propoxyphene             300          Marijuana Metabolites     50    For medical purposes only.   Creatinine, Urine  Date Value Ref Range Status  02/19/2020 300.84 mg/dL Final    Comment:    Performed at [facility], [place] [address], [place], [place] [address]         Failed - HGB in normal range and within 360 days    Hemoglobin  Date Value Ref Range Status  07/11/2021 9.9 (L) 12.0 - 15.0 g/dL Final  11/25/2019 9.0 (L) 11.1 -  15.9 g/dL Final         Failed - HCT in normal range and within 360 days    HCT  Date Value Ref Range Status  07/11/2021 32.2 (L) 36.0 - 46.0 % Final   Hematocrit  Date Value Ref Range Status  11/25/2019 28.2 (L) 34.0 - 46.6 % Final         Passed - PLT in normal range and within 360 days    Platelets  Date Value Ref Range Status  07/11/2021 259 150 - 400 K/uL Final  11/25/2019 375 150 - 450 x10E3/uL Final         Passed - eGFR is 30 or above and within 360 days    GFR, Est African American  Date Value Ref Range Status  02/18/2016 57 (L) >=60 mL/min Final   GFR calc Af Amer  Date Value Ref Range Status  11/25/2019 56 (L) >59 mL/min/1.73 Final    Comment:    **In accordance with recommendations from the NKF-ASN Task force,**   [facility] is in the process of updating its eGFR calculation to the   2021 CKD-EPI creatinine equation that estimates kidney function   without a race variable.    GFR, Est Non African American  Date Value Ref Range Status  02/18/2016 50 (L) >=60 mL/min Final   GFR, Estimated  Date Value Ref Range Status  07/12/2021 39 (L) >60 mL/min Final    Comment:    (NOTE) Calculated using the CKD-EPI Creatinine Equation (2021)    eGFR  Date Value Ref Range Status  07/13/2020 21 (L) >59 mL/min/1.73 Final         Passed - Patient is not pregnant      Passed - Valid encounter within last  12 months    Recent Outpatient Visits           2 weeks ago Frequent falls   [place], Enobong, MD   2 months ago Lumbar radiculopathy   [place], Enobong, MD   3 months ago Weakness of both lower extremities   [place], Enobong, MD   4 months ago Edema of both legs   [place], Zelda W, NP   5 months ago Hospital discharge follow-up   [place] Gildardo Pounds, NP

## 2021-07-14 NOTE — Telephone Encounter (Signed)
Medication Refill - Medication: pregabalin (LYRICA) 75 MG capsule and DULoxetine (CYMBALTA) 30 MG capsule  Has the patient contacted their pharmacy? Yes.   Pt told to contact provider  Preferred Pharmacy (with phone number or street name):  [facility], [place] Phone:  [phone]  Fax:  [phone]     Has the patient been seen for an appointment in the last year OR does the patient have an upcoming appointment? Yes.    Agent: Please be advised that RX refills may take up to 3 business days. We ask that you follow-up with your pharmacy.

## 2021-07-15 ENCOUNTER — Emergency Department (HOSPITAL_COMMUNITY): Payer: Medicare Other

## 2021-07-15 ENCOUNTER — Encounter (HOSPITAL_COMMUNITY): Payer: Self-pay

## 2021-07-15 ENCOUNTER — Emergency Department (HOSPITAL_COMMUNITY)
Admission: EM | Admit: 2021-07-15 | Discharge: 2021-07-18 | Disposition: A | Discharge status: Home / Self Care | Payer: Medicare Other | Attending: Emergency Medicine | Admitting: Emergency Medicine

## 2021-07-15 ENCOUNTER — Other Ambulatory Visit: Payer: Self-pay

## 2021-07-15 DIAGNOSIS — I2510 Atherosclerotic heart disease of native coronary artery without angina pectoris: Secondary | ICD-10-CM | POA: Diagnosis not present

## 2021-07-15 DIAGNOSIS — E1122 Type 2 diabetes mellitus with diabetic chronic kidney disease: Secondary | ICD-10-CM | POA: Insufficient documentation

## 2021-07-15 DIAGNOSIS — Z79899 Other long term (current) drug therapy: Secondary | ICD-10-CM | POA: Insufficient documentation

## 2021-07-15 DIAGNOSIS — M7731 Calcaneal spur, right foot: Secondary | ICD-10-CM | POA: Diagnosis not present

## 2021-07-15 DIAGNOSIS — I10 Essential (primary) hypertension: Secondary | ICD-10-CM | POA: Diagnosis not present

## 2021-07-15 DIAGNOSIS — I130 Hypertensive heart and chronic kidney disease with heart failure and stage 1 through stage 4 chronic kidney disease, or unspecified chronic kidney disease: Secondary | ICD-10-CM | POA: Diagnosis not present

## 2021-07-15 DIAGNOSIS — Z20822 Contact with and (suspected) exposure to covid-19: Secondary | ICD-10-CM | POA: Diagnosis not present

## 2021-07-15 DIAGNOSIS — R627 Adult failure to thrive: Secondary | ICD-10-CM | POA: Diagnosis not present

## 2021-07-15 DIAGNOSIS — F22 Delusional disorders: Secondary | ICD-10-CM | POA: Diagnosis not present

## 2021-07-15 DIAGNOSIS — M19071 Primary osteoarthritis, right ankle and foot: Secondary | ICD-10-CM | POA: Diagnosis not present

## 2021-07-15 DIAGNOSIS — J449 Chronic obstructive pulmonary disease, unspecified: Secondary | ICD-10-CM | POA: Diagnosis not present

## 2021-07-15 DIAGNOSIS — M25561 Pain in right knee: Secondary | ICD-10-CM | POA: Diagnosis not present

## 2021-07-15 DIAGNOSIS — N183 Chronic kidney disease, stage 3 unspecified: Secondary | ICD-10-CM | POA: Diagnosis not present

## 2021-07-15 DIAGNOSIS — Z7951 Long term (current) use of inhaled steroids: Secondary | ICD-10-CM | POA: Diagnosis not present

## 2021-07-15 DIAGNOSIS — I5032 Chronic diastolic (congestive) heart failure: Secondary | ICD-10-CM | POA: Insufficient documentation

## 2021-07-15 DIAGNOSIS — M1711 Unilateral primary osteoarthritis, right knee: Secondary | ICD-10-CM | POA: Diagnosis not present

## 2021-07-15 DIAGNOSIS — Z743 Need for continuous supervision: Secondary | ICD-10-CM | POA: Diagnosis not present

## 2021-07-15 DIAGNOSIS — J45909 Unspecified asthma, uncomplicated: Secondary | ICD-10-CM | POA: Insufficient documentation

## 2021-07-15 DIAGNOSIS — E1140 Type 2 diabetes mellitus with diabetic neuropathy, unspecified: Secondary | ICD-10-CM | POA: Insufficient documentation

## 2021-07-15 DIAGNOSIS — F250 Schizoaffective disorder, bipolar type: Secondary | ICD-10-CM | POA: Diagnosis not present

## 2021-07-15 DIAGNOSIS — Z87891 Personal history of nicotine dependence: Secondary | ICD-10-CM | POA: Diagnosis not present

## 2021-07-15 DIAGNOSIS — G931 Anoxic brain damage, not elsewhere classified: Secondary | ICD-10-CM | POA: Diagnosis present

## 2021-07-15 DIAGNOSIS — G8929 Other chronic pain: Secondary | ICD-10-CM | POA: Diagnosis not present

## 2021-07-15 DIAGNOSIS — D631 Anemia in chronic kidney disease: Secondary | ICD-10-CM | POA: Diagnosis not present

## 2021-07-15 DIAGNOSIS — S92512D Displaced fracture of proximal phalanx of left lesser toe(s), subsequent encounter for fracture with routine healing: Secondary | ICD-10-CM | POA: Diagnosis not present

## 2021-07-15 DIAGNOSIS — M79606 Pain in leg, unspecified: Secondary | ICD-10-CM | POA: Insufficient documentation

## 2021-07-15 DIAGNOSIS — E119 Type 2 diabetes mellitus without complications: Secondary | ICD-10-CM | POA: Diagnosis not present

## 2021-07-15 DIAGNOSIS — R29898 Other symptoms and signs involving the musculoskeletal system: Secondary | ICD-10-CM | POA: Diagnosis not present
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
## 2021-07-16 DIAGNOSIS — S92512D Displaced fracture of proximal phalanx of left lesser toe(s), subsequent encounter for fracture with routine healing: Secondary | ICD-10-CM | POA: Diagnosis not present

## 2021-07-16 DIAGNOSIS — G8929 Other chronic pain: Secondary | ICD-10-CM | POA: Diagnosis not present

## 2021-07-16 DIAGNOSIS — F250 Schizoaffective disorder, bipolar type: Secondary | ICD-10-CM | POA: Diagnosis not present

## 2021-07-16 DIAGNOSIS — Z9181 History of falling: Secondary | ICD-10-CM | POA: Diagnosis not present

## 2021-07-16 DIAGNOSIS — M170 Bilateral primary osteoarthritis of knee: Secondary | ICD-10-CM | POA: Diagnosis not present

## 2021-07-16 DIAGNOSIS — M7731 Calcaneal spur, right foot: Secondary | ICD-10-CM | POA: Diagnosis not present

## 2021-07-16 DIAGNOSIS — E119 Type 2 diabetes mellitus without complications: Secondary | ICD-10-CM | POA: Diagnosis not present

## 2021-07-16 DIAGNOSIS — J45909 Unspecified asthma, uncomplicated: Secondary | ICD-10-CM | POA: Diagnosis not present

## 2021-07-16 DIAGNOSIS — M25561 Pain in right knee: Secondary | ICD-10-CM | POA: Diagnosis not present

## 2021-07-16 DIAGNOSIS — M1711 Unilateral primary osteoarthritis, right knee: Secondary | ICD-10-CM | POA: Diagnosis not present

## 2021-07-16 DIAGNOSIS — G4733 Obstructive sleep apnea (adult) (pediatric): Secondary | ICD-10-CM | POA: Diagnosis not present

## 2021-07-16 DIAGNOSIS — R062 Wheezing: Secondary | ICD-10-CM | POA: Diagnosis not present

## 2021-07-16 DIAGNOSIS — M19071 Primary osteoarthritis, right ankle and foot: Secondary | ICD-10-CM | POA: Diagnosis not present

## 2021-07-16 LAB — RAPID URINE DRUG SCREEN, HOSP PERFORMED
Amphetamines: NOT DETECTED
Barbiturates: NOT DETECTED
Benzodiazepines: POSITIVE — AB
Cocaine: NOT DETECTED
Opiates: NOT DETECTED
Tetrahydrocannabinol: NOT DETECTED

## 2021-07-16 LAB — URINALYSIS, ROUTINE W REFLEX MICROSCOPIC
Bacteria, UA: NONE SEEN
Bilirubin Urine: NEGATIVE
Glucose, UA: NEGATIVE mg/dL
Ketones, ur: 5 mg/dL — AB
Nitrite: NEGATIVE
Protein, ur: 100 mg/dL — AB
Specific Gravity, Urine: 1.006 (ref 1.005–1.030)
pH: 7 (ref 5.0–8.0)

## 2021-07-16 LAB — BASIC METABOLIC PANEL
Anion gap: 11 (ref 5–15)
BUN: 14 mg/dL (ref 8–23)
CO2: 24 mmol/L (ref 22–32)
Calcium: 9.8 mg/dL (ref 8.9–10.3)
Chloride: 106 mmol/L (ref 98–111)
Creatinine, Ser: 1 mg/dL (ref 0.44–1.00)
GFR, Estimated: >60 mL/min (ref >60)
Glucose, Bld: 76 mg/dL (ref 70–99)
Potassium: 3.6 mmol/L (ref 3.5–5.1)
Sodium: 141 mmol/L (ref 135–145)

## 2021-07-16 LAB — CBC WITH DIFFERENTIAL/PLATELET
Abs Immature Granulocytes: 0.02 10*3/uL (ref 0.00–0.07)
Basophils Absolute: 0 10*3/uL (ref 0.0–0.1)
Basophils Relative: 1 %
Eosinophils Absolute: 0.1 10*3/uL (ref 0.0–0.5)
Eosinophils Relative: 1 %
HCT: 35.3 % — ABNORMAL LOW (ref 36.0–46.0)
Hemoglobin: 11.2 g/dL — ABNORMAL LOW (ref 12.0–15.0)
Immature Granulocytes: 0 %
Lymphocytes Relative: 31 %
Lymphs Abs: 1.8 10*3/uL (ref 0.7–4.0)
MCH: 27.1 pg (ref 26.0–34.0)
MCHC: 31.7 g/dL (ref 30.0–36.0)
MCV: 85.5 fL (ref 80.0–100.0)
Monocytes Absolute: 0.6 10*3/uL (ref 0.1–1.0)
Monocytes Relative: 11 %
Neutro Abs: 3.3 10*3/uL (ref 1.7–7.7)
Neutrophils Relative %: 56 %
Platelets: 254 10*3/uL (ref 150–400)
RBC: 4.13 MIL/uL (ref 3.87–5.11)
RDW: 15.4 % (ref 11.5–15.5)
WBC: 5.9 10*3/uL (ref 4.0–10.5)
nRBC: 0 % (ref 0.0–0.2)

## 2021-07-16 LAB — RESP PANEL BY RT-PCR (FLU A&B, COVID) ARPGX2
Influenza A by PCR: NEGATIVE
Influenza B by PCR: NEGATIVE
SARS Coronavirus 2 by RT PCR: NEGATIVE

## 2021-07-16 MED ORDER — GABAPENTIN 400 MG PO CAPS
800.0000 mg | ORAL_CAPSULE | Freq: Every day | ORAL | Status: DC
  Administered 2021-07-16 – 2021-07-18 (×3): 800 mg via ORAL
  Filled 2021-07-16 (×3): qty 2

## 2021-07-16 MED ORDER — TORSEMIDE 20 MG PO TABS
20.0000 mg | ORAL_TABLET | Freq: Every day | ORAL | Status: DC
Start: 2021-07-16 — End: 2021-07-18
  Administered 2021-07-16 – 2021-07-18 (×3): 20 mg via ORAL
  Filled 2021-07-16 (×3): qty 1

## 2021-07-16 MED ORDER — ESCITALOPRAM OXALATE 10 MG PO TABS
10.0000 mg | ORAL_TABLET | Freq: Every day | ORAL | Status: DC
  Administered 2021-07-16: 10 mg via ORAL
  Filled 2021-07-16 (×2): qty 1

## 2021-07-16 MED ORDER — OXYBUTYNIN CHLORIDE 5 MG PO TABS
5.0000 mg | ORAL_TABLET | Freq: Two times a day (BID) | ORAL | Status: DC
  Administered 2021-07-16 – 2021-07-18 (×5): 5 mg via ORAL
  Filled 2021-07-16 (×5): qty 1

## 2021-07-16 MED ORDER — PREGABALIN 50 MG PO CAPS
75.0000 mg | ORAL_CAPSULE | Freq: Two times a day (BID) | ORAL | Status: DC
  Administered 2021-07-16 – 2021-07-18 (×5): 75 mg via ORAL
  Filled 2021-07-16 (×5): qty 1

## 2021-07-16 MED ORDER — ATORVASTATIN CALCIUM 10 MG PO TABS
20.0000 mg | ORAL_TABLET | Freq: Every day | ORAL | Status: DC
  Administered 2021-07-16 – 2021-07-18 (×3): 20 mg via ORAL
  Filled 2021-07-16 (×3): qty 2

## 2021-07-16 MED ORDER — DULOXETINE HCL 30 MG PO CPEP
30.0000 mg | ORAL_CAPSULE | Freq: Every morning | ORAL | Status: DC
  Administered 2021-07-16: 30 mg via ORAL
  Filled 2021-07-16 (×2): qty 1

## 2021-07-16 MED ORDER — DIAZEPAM 5 MG PO TABS: 5.0000 mg | ORAL_TABLET | Freq: Every day | ORAL | Status: DC | PRN

## 2021-07-16 MED ORDER — POTASSIUM CHLORIDE CRYS ER 20 MEQ PO TBCR
20.0000 meq | EXTENDED_RELEASE_TABLET | Freq: Every day | ORAL | Status: DC
  Administered 2021-07-16 – 2021-07-18 (×3): 20 meq via ORAL
  Filled 2021-07-16 (×3): qty 1

## 2021-07-16 MED ORDER — PANTOPRAZOLE SODIUM 40 MG PO TBEC
40.0000 mg | DELAYED_RELEASE_TABLET | Freq: Every day | ORAL | Status: DC
  Administered 2021-07-16 – 2021-07-18 (×3): 40 mg via ORAL
  Filled 2021-07-16 (×3): qty 1

## 2021-07-16 MED ORDER — AMLODIPINE BESYLATE 5 MG PO TABS
5.0000 mg | ORAL_TABLET | Freq: Every day | ORAL | Status: DC
  Administered 2021-07-16 – 2021-07-17 (×2): 5 mg via ORAL
  Filled 2021-07-16 (×2): qty 1
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
## 2021-07-16 NOTE — Progress Notes (Signed)
Per Quintella Reichert, NP, patient meets criteria for inpatient treatment. There are no available beds at [facility] today. [name] faxed referrals to the following facilities for review:  [facility] [address], [place] [address] [phone] [place] [address], [place] [address] [phone] --  [facility]  Pending - Request Sent N/A [address] [place] [address] [phone] --  [facility]  Pending - Request Sent N/A [place], [place] [address] [phone] --  [place] N/A [address] [place], [place] [address] [phone] --  [facility] [address] [place] [address] [phone] --  [place] N/A [address] [place] [address] [phone] --  [facility] [address], [place] [address] [phone] --  [place], [place]  [address] [phone] --   TTS will continue to seek bed placement.  Glennie Isle, MSW, [place] Phone: [phone] Disposition/TOC

## 2021-07-16 NOTE — ED Provider Notes (Signed)
Emergency Medicine Observation Re-evaluation Note  Tiffany [name] is a 61 y.o. female, seen on rounds today.  Pt initially presented to the ED for complaints of Chronic Leg Pain and Failure To Thrive Currently, the patient is resting comfortably. Patient had come into the ER with pain.  Allegedly, patient was delusional and there were concerns for self-harm by family.  Patient was involuntarily committed.  Physical Exam  BP (!) 153/109   Pulse 91   Temp 98.2 F (36.8 C)   Resp 20   Ht '5\' 2"'$  (1.575 m)   Wt 74.8 kg   SpO2 95%   BMI 30.18 kg/m  Physical Exam General: No acute distress Cardiac: Regular rate Lungs: No respiratory distress Psych: Calm  ED Course / MDM  EKG:   I have reviewed the labs performed to date as well as medications administered while in observation.  Recent changes in the last 24 hours include patient was assessed by medical team and cleared.  Psychiatry team saw the patient early in the morning and have requested that she be placed for [facility] psych admission.  Plan  Current plan is for continued monitoring in the ER while placement. Tiffany [name] is under involuntary commitment.      Varney Biles, MD 07/16/21 [phone]

## 2021-07-16 NOTE — Progress Notes (Signed)
[name] faxed referral to [name] for further review.  Glennie Isle, MSW, [place], LCAS-A Phone: [phone] Disposition/TOC

## 2021-07-16 NOTE — ED Notes (Signed)
Patients linens changed, new brief applied, new purewick. Barrier cream placed on patients buttocks.

## 2021-07-17 DIAGNOSIS — F250 Schizoaffective disorder, bipolar type: Secondary | ICD-10-CM | POA: Diagnosis not present

## 2021-07-17 DIAGNOSIS — F22 Delusional disorders: Secondary | ICD-10-CM

## 2021-07-17 MED ORDER — DULOXETINE HCL 20 MG PO CPEP
40.0000 mg | ORAL_CAPSULE | Freq: Every morning | ORAL | Status: DC
  Administered 2021-07-17 – 2021-07-18 (×2): 40 mg via ORAL
  Filled 2021-07-17 (×2): qty 2

## 2021-07-17 MED ORDER — ASENAPINE MALEATE 5 MG SL SUBL
10.0000 mg | SUBLINGUAL_TABLET | Freq: Two times a day (BID) | SUBLINGUAL | Status: DC
  Administered 2021-07-17 – 2021-07-18 (×3): 10 mg via SUBLINGUAL
  Filled 2021-07-17 (×3): qty 2

## 2021-07-17 MED ORDER — CARVEDILOL 3.125 MG PO TABS
6.2500 mg | ORAL_TABLET | Freq: Two times a day (BID) | ORAL | Status: DC
  Administered 2021-07-17 – 2021-07-18 (×2): 6.25 mg via ORAL
  Filled 2021-07-17 (×2): qty 2

## 2021-07-17 MED ORDER — OLANZAPINE 5 MG PO TBDP: 5.0000 mg | ORAL_TABLET | Freq: Every day | ORAL | Status: DC

## 2021-07-17 NOTE — ED Notes (Signed)
Patient had 2 sprites, 2 pb's, 2 packs of graham crackers, 2 apple sauce cups, 1 pudding and 1 [place] ice
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
## 2021-07-17 NOTE — ED Provider Notes (Signed)
Emergency Medicine Observation Re-evaluation Note  Tiffany [name] is a 61 y.o. female, seen on rounds today.  Pt initially presented to the ED for complaints of Chronic Leg Pain and Failure To Thrive Currently, the patient is resting.  Physical Exam  BP (!) 142/98   Pulse 88   Temp 98.2 F (36.8 C)   Resp 20   Ht '5\' 2"'$  (1.575 m)   Wt 74.8 kg   SpO2 91%   BMI 30.18 kg/m  Physical Exam General: No distress Cardiac: No murmur Lungs: Clear Psych: No agitation  ED Course / MDM  EKG:   I have reviewed the labs performed to date as well as medications administered while in observation.  Recent changes in the last 24 hours include none.  Plan  Current plan is for awaiting placement. Tiffany [name] is under involuntary commitment.      Harlan Vinal, Gwenyth Allegra, MD 07/17/21 1018

## 2021-07-17 NOTE — ED Notes (Signed)
Patients linens and brief changed again.

## 2021-07-18 ENCOUNTER — Telehealth: Payer: Self-pay | Admitting: *Deleted

## 2021-07-18 ENCOUNTER — Ambulatory Visit: Payer: Self-pay | Admitting: *Deleted

## 2021-07-18 DIAGNOSIS — F250 Schizoaffective disorder, bipolar type: Secondary | ICD-10-CM | POA: Diagnosis not present

## 2021-07-18 DIAGNOSIS — Z7401 Bed confinement status: Secondary | ICD-10-CM | POA: Diagnosis not present

## 2021-07-18 DIAGNOSIS — R5381 Other malaise: Secondary | ICD-10-CM | POA: Diagnosis not present

## 2021-07-18 DIAGNOSIS — R5383 Other fatigue: Secondary | ICD-10-CM | POA: Diagnosis not present

## 2021-07-18 MED ORDER — PREGABALIN 75 MG PO CAPS: 75.0000 mg | ORAL_CAPSULE | Freq: Two times a day (BID) | ORAL | 0 refills | Status: DC

## 2021-07-18 NOTE — ED Provider Notes (Signed)
Emergency Medicine Observation Re-evaluation Note  Tiffany [name] is a 61 y.o. female, seen on rounds today.  Pt initially presented to the ED for complaints of Chronic Leg Pain and Failure To Thrive Currently, the patient is resting.  Physical Exam  BP (!) 144/101   Pulse 81   Temp 98.2 F (36.8 C)   Resp 18   Ht '5\' 2"'$  (1.575 m)   Wt 74.8 kg   SpO2 92%   BMI 30.18 kg/m  Physical Exam Pulmonary:     Effort: Pulmonary effort is normal.  Neurological:     Mental Status: [name] alert.  Psychiatric:        Mood and Affect: Mood normal.      ED Course / MDM  EKG:   I have reviewed the labs performed to date as well as medications administered while in observation.  Recent changes in the last 24 hours include nothing .  Plan  Current plan is for placement. Tiffany [name] is under involuntary commitment.      Lennice Sites, DO 07/18/21 [phone]

## 2021-07-18 NOTE — Discharge Summary (Signed)
[facility] Psych ED Discharge  07/18/2021 1:08 PM Tiffany [name]  MRN:  [id number]  Principal Problem: Delusional disorder, persecutory type [facility]) Discharge Diagnoses: Principal Problem:   Delusional disorder, persecutory type ([place]) Active Problems:   Anoxic brain injury ([place])   Schizoaffective disorder, bipolar type ([place])  Clinical Impression:  Final diagnoses:  Delusions ([place])   Subjective: Tiffany [name] is a 61 year old female patient with past psychiatric history of schizoaffective disorder, cocaine use disorder severe, anoxic brain injury, binge eating disorder who presented to [facility] by EMS for chronic leg pain and evaluation where pt has been reportedly showing signs of weakness and delusions about her family harming her. This is her 36 ED visit IN 2023.  Patient reports that she comes mostly to the ER to get extra medications.  [name] surprised that Physiatry is seeing her because she only came to get more pain medications.  Review of PDMD shows patient on Vyvanse, Valium, Oxycodone, Morphine Sulf ER, Pregabalin since January and these medications have not been refilled since after may 31 st.  Vyvanse was last filled June 2nd.  2023.  These medications are prescribed in most cases by differently Doctors.  Patient is alert and oriented x 4, answers questions accurately.  She denied feeling suicidal and stated that she only comes to the hospital for pain medication.  [name] also on Gabapentin 800 mg as well.  Based on these reviews patient is dealing with addiction to controlled medications.  Her ER visits are to try and get some of these medications filled here.  ER Physician  have referred her to Pain Clinic while Psychiatry have referred to [place] for substance abuse treatment.  She has several TOC consults each she comes to er.  Patient reported that she has home care staff 7 days a week. Mother does not visit her and does not do any harm to her.  [name] taking Saphris and Cymbalta for Delusional  disorder and depression and pain respectively.  Patient denies SI/HI/AVH and is discharged home.  ED Assessment Time Calculation: Start Time: 1237 Stop Time: 1308 Total Time in Minutes (Assessment Completion): 31   Past Psychiatric History: psychiatric history of schizoaffective disorder, cocaine use disorder severe, anoxic brain injury, binge eating disorder.    H/O multiple psychiatric hospitalization due to drug use and mental illness.  In the past she had tried Abilify, Risperdal, Wellbutrin, Xanax, Klonopin, trazodone and BuSpar.  H/O paranoia, delusion, anxiety, manic-like symptoms. Currently she is taking Saphris 10 mg po bid sublingual.  Past Medical History:  Past Medical History:  Diagnosis Date   Agitation 11/22/2017   Anoxic brain injury ([place]) 09/08/2016   C. Arrest due to respiratory failure and COPD exacerbation   Anxiety    Arthritis    "all over" (04/10/2016)   Asthma 10/18/2010   Binge eating disorder    Cardiac arrest ([place]) 09/08/2016   PEA   Carotid artery stenosis    1-39% bilateral by dopplers 11/2016   Chronic diastolic (congestive) heart failure (HCC)    Chronic kidney disease, stage 3 (HCC)    Chronic pain syndrome 06/18/2012   Chronic post-traumatic stress disorder (PTSD) 05/27/2018   Chronic respiratory failure with hypoxia and hypercapnia ([place]) 06/22/2015   TRILOGY Vent >AVAPA-ES., Vt target 200-400, Max P 30 , PS max 20 , PS min 6-10 , E Max 6, E Min 4, Rate Auto AVAPS Rate 2 (titrate for pt comfort) , bleed O2 at 5l/m continuous flow .  Closed displaced fracture of fifth metacarpal bone 03/21/2018   Cocaine use disorder, severe, in sustained remission ([place]) 21/19/4174   Complication of anesthesia    decreased bp, decreased heart rate   COPD (chronic obstructive pulmonary disease) ([place]) 07/08/2014   Depression    Diabetic neuropathy ([place]) 04/24/2011   Difficulty with speech 01/24/2018   Disorder of nervous system    Drug abuse ([place]) 11/21/2017    Dyslipidemia 04/24/2011   Elevated troponin 04/28/2012   Emphysema    Encephalopathy 11/21/2017   Essential hypertension 03/22/2016   Fibula fracture 07/10/2016   Frequent falls 10/11/2017   GERD (gastroesophageal reflux disease)    Gout 04/11/2017   Heart attack ([place]) 1980s   History of blood transfusion 1994   "couldn't stop bleeding from my period"   History of drug abuse in remission ([place]) 11/28/2015   Quit in 2017   Hyperlipidemia LDL goal <70    Incontinence    Manic depression ([place])    Morbid obesity ([place]) 10/18/2010   Obstructive sleep apnea 10/18/2010   On home oxygen therapy    "6L; 24/7" (04/10/2016)   OSA on CPAP    "wear mask sometimes" (04/10/2016)   Paranoid ([place])    "sometimes; I'm on RX for it" (04/10/2016)   Prolonged Q-T interval on ECG    Rectal bleeding 12/31/2015   Schizoaffective disorder, bipolar type ([place]) 04/05/2018   Seasonal allergies    Seborrheic keratoses 12/31/2013   Seizures ([place])    "don't know what kind; last one was ~ 1 yr ago" (04/10/2016)   Sinus bradycardia    Stroke [facility]) 1980s   denies residual on 04/10/2016   Thrush 09/19/2013   Type 2 diabetes mellitus ([place]) 10/18/2010    Past Surgical History:  Procedure Laterality Date   CESAREAN SECTION  1997   COLONOSCOPY WITH PROPOFOL N/A 04/01/2021   Procedure: COLONOSCOPY WITH PROPOFOL;  Surgeon: Carol Ada, MD;  Location: [place];  Service: Gastroenterology;  Laterality: N/A;  Rectal bleeding with drop in hemoglobin to 7.2 g/dL   HERNIA REPAIR     IR CHOLANGIOGRAM EXISTING TUBE  07/20/2016   IR PERC CHOLECYSTOSTOMY  05/10/2016   IR RADIOLOGIST EVAL & MGMT  06/08/2016   IR RADIOLOGIST EVAL & MGMT  06/29/2016   IR SINUS/FIST TUBE CHK-NON GI  07/12/2016   RIGHT/LEFT HEART CATH AND CORONARY ANGIOGRAPHY N/A 06/19/2017   Procedure: RIGHT/LEFT HEART CATH AND CORONARY ANGIOGRAPHY;  Surgeon: Jolaine Artist, MD;  Location: [place] CV LAB;  Service: Cardiovascular;  Laterality: N/A;   TIBIA  IM NAIL INSERTION Right 07/12/2016   Procedure: INTRAMEDULLARY (IM) NAIL RIGHT TIBIA;  Surgeon: Leandrew Koyanagi, MD;  Location: [place];  Service: Orthopedics;  Laterality: Right;   UMBILICAL HERNIA REPAIR  ~ 1963   "that's why I don't have a belly button"   VAGINAL HYSTERECTOMY     Family History:  Family History  Problem Relation Age of Onset   Cancer Mother        lung   Depression Mother    Cancer Father        prostate   Depression Sister    Anxiety disorder Sister    Schizophrenia Sister    Bipolar disorder Sister    Depression Sister    Depression Brother    Heart failure Other        cousin   Family Psychiatric  History: Unknown Social History:  Social History   Substance and Sexual Activity  Alcohol Use No  Alcohol/week: 0.0 standard drinks of alcohol     Social History   Substance and Sexual Activity  Drug Use Not Currently   Types: Cocaine   Comment: 04/10/2016 "last used cocaine back in November 2017"    Social History   Socioeconomic History   Marital status: Widowed    Spouse name: Not on file   Number of children: 3   Years of education: Not on file   Highest education level: Not on file  Occupational History   Occupation: disabled    Comment: factory production  Tobacco Use   Smoking status: Former    Packs/day: 1.50    Years: 38.00    Total pack years: 57.00    Types: Cigarettes    Start date: 03/13/1977    Quit date: 04/10/2016    Years since quitting: 5.2   Smokeless tobacco: Never  Vaping Use   Vaping Use: Never used  Substance and Sexual Activity   Alcohol use: No    Alcohol/week: 0.0 standard drinks of alcohol   Drug use: Not Currently    Types: Cocaine    Comment: 04/10/2016 "last used cocaine back in November 2017"   Sexual activity: Not Currently    Birth control/protection: Surgical  Other Topics Concern   Not on file  Social History Narrative   Has 1 son, [name]   Lives with son and his boyfriend   Her house has ramps and  handrails should she ever needs them.    Her mother lives down the street from her and is a good support person in addition to her son.   She drives herself, has private transportation.    Cocaine free since 02/24/16, smoke free since 04/10/16   Social Determinants of Health   Financial Resource Strain: Medium Risk (07/04/2021)   Overall Financial Resource Strain (CARDIA)    Difficulty of Paying Living Expenses: Somewhat hard  Food Insecurity: Food Insecurity Present (06/23/2021)   Hunger Vital Sign    Worried About Running Out of Food in the Last Year: Never true    Ran Out of Food in the Last Year: Sometimes true  Transportation Needs: Unmet Transportation Needs (06/23/2021)   PRAPARE - Hydrologist (Medical): Yes    Lack of Transportation (Non-Medical): Yes  Physical Activity: Not on file  Stress: Not on file  Social Connections: Not on file    Tobacco Cessation:  N/A, patient does not currently use tobacco products  Current Medications: Current Facility-Administered Medications  Medication Dose Route Frequency Provider Last Rate Last Admin   amLODipine (NORVASC) tablet 5 mg  5 mg Oral QHS Marcello Fennel, PA-C   5 mg at 07/17/21 2148   asenapine (SAPHRIS) sublingual tablet 10 mg  10 mg Sublingual BID Leevy-Johnson, Brooke A, NP   10 mg at 07/18/21 0959   atorvastatin (LIPITOR) tablet 20 mg  20 mg Oral Daily Marcello Fennel, PA-C   20 mg at 07/18/21 1000   carvedilol (COREG) tablet 6.25 mg  6.25 mg Oral BID WC Tegeler, Gwenyth Allegra, MD   6.25 mg at 07/18/21 1001   diazepam (VALIUM) tablet 5 mg  5 mg Oral Daily PRN Marcello Fennel, PA-C       DULoxetine (CYMBALTA) DR capsule 40 mg  40 mg Oral q morning Leevy-Johnson, Brooke A, NP   40 mg at 07/18/21 1001   gabapentin (NEURONTIN) capsule 800 mg  800 mg Oral Daily Marcello Fennel, PA-C   800 mg  at 07/18/21 1000   oxybutynin (DITROPAN) tablet 5 mg  5 mg Oral BID Marcello Fennel, PA-C   5 mg  at 07/18/21 1001   pantoprazole (PROTONIX) EC tablet 40 mg  40 mg Oral Daily Marcello Fennel, PA-C   40 mg at 07/18/21 1000   potassium chloride SA (KLOR-CON M) CR tablet 20 mEq  20 mEq Oral Daily Marcello Fennel, PA-C   20 mEq at 07/18/21 1007   pregabalin (LYRICA) capsule 75 mg  75 mg Oral BID Marcello Fennel, PA-C   75 mg at 07/18/21 1000   torsemide (DEMADEX) tablet 20 mg  20 mg Oral Daily Marcello Fennel, PA-C   20 mg at 07/18/21 1001   Current Outpatient Medications  Medication Sig Dispense Refill   albuterol (VENTOLIN HFA) 108 (90 Base) MCG/ACT inhaler Inhale 2 puffs into the lungs every 6 (six) hours as needed for wheezing or shortness of breath. 8.5 g 2   amLODipine (NORVASC) 5 MG tablet Take 1 tablet (5 mg total) by mouth at bedtime. (Patient taking differently: Take 10 mg by mouth at bedtime.) 30 tablet 6   Asenapine Maleate 10 MG SUBL Place 1 tablet (10 mg total) under the tongue 2 (two) times daily. 10 tablet 0   atorvastatin (LIPITOR) 20 MG tablet Take 1 tablet (20 mg total) by mouth daily. 90 tablet 1   carvedilol (COREG) 6.25 MG tablet Take 1 tablet (6.25 mg total) by mouth 2 (two) times daily with a meal. 60 tablet 3   colchicine 0.6 MG tablet Take 2 tablets by mouth at the onset of gout flare, may repeat 1 tablet in 1 hour if symptoms persist (Patient taking differently: Take 0.6 mg by mouth daily.) 30 tablet 1   cyclobenzaprine (FLEXERIL) 10 MG tablet Take 10 mg by mouth 2 (two) times daily as needed.     diazepam (VALIUM) 5 MG tablet Take 1 tablet (5 mg total) by mouth daily as needed for anxiety. 30 tablet 0   diclofenac Sodium (VOLTAREN) 1 % GEL Apply 4 g topically 4 (four) times daily as needed (pain). (Patient taking differently: Apply 1 application  topically 3 (three) times daily.) 100 g 3   dicyclomine (BENTYL) 10 MG capsule Take 1 capsule (10 mg total) by mouth 3 (three) times daily before meals. 90 capsule 6   DULoxetine (CYMBALTA) 30 MG capsule Take 30  mg by mouth every morning.     febuxostat (ULORIC) 40 MG tablet Take 40 mg by mouth every morning.     Fluticasone-Umeclidin-Vilant (TRELEGY ELLIPTA) 100-62.5-25 MCG/ACT AEPB Inhale 1 puff into the lungs daily. 60 each 6   Iron, Ferrous Sulfate, 325 (65 Fe) MG TABS Take 325 mg by mouth daily. 60 tablet 2   loperamide (IMODIUM) 2 MG capsule Take 2 capsules (4 mg total) by mouth 3 (three) times daily as needed for diarrhea or loose stools. 30 capsule 0   methocarbamol (ROBAXIN) 500 MG tablet Take 500 mg by mouth in the morning and at bedtime.     montelukast (SINGULAIR) 10 MG tablet Take 1 tablet (10 mg total) by mouth at bedtime. 30 tablet 6   omeprazole (PRILOSEC) 40 MG capsule Take 1 capsule (40 mg total) by mouth every morning. 30 capsule 6   oxybutynin (DITROPAN) 5 MG tablet Take 1 tablet (5 mg total) by mouth 2 (two) times daily. 60 tablet 5   potassium chloride SA (KLOR-CON M) 20 MEQ tablet Take 1 tablet (20 mEq total) by  mouth daily. 30 tablet 6   prazosin (MINIPRESS) 2 MG capsule Take 1 capsule (2 mg total) by mouth at bedtime. TAKE 1 CAPSULE(2 MG) BY MOUTH AT BEDTIME Strength: 2 mg (Patient taking differently: Take 2 mg by mouth at bedtime.) 30 capsule 6   saccharomyces boulardii (FLORASTOR) 250 MG capsule Take 1 capsule (250 mg total) by mouth 2 (two) times daily for 14 days. 28 capsule 0   terbinafine (LAMISIL AT) 1 % cream Apply 1 application  topically 2 (two) times daily. 30 g 1   torsemide (DEMADEX) 20 MG tablet Take 1 tablet (20 mg total) by mouth daily. 30 tablet 6   pregabalin (LYRICA) 75 MG capsule Take 1 capsule (75 mg total) by mouth 2 (two) times daily. 60 capsule 0   PTA Medications: (Not in a hospital admission)   [facility] ED from 07/15/2021 in [facility] DEPT ED to Hosp-Admission (Discharged) from 07/09/2021 in [facility] ED from 07/04/2021 in [facility]  DEPT  C-SSRS RISK CATEGORY No Risk No Risk No Risk       Musculoskeletal: Strength & Muscle Tone:  w/c bound Gait & Station:  seen in bed , w/c Bound Patient leans: Front  Psychiatric Specialty Exam: Presentation  General Appearance: Appropriate for Environment; Fairly Groomed; Well Groomed  Eye Contact:Good  Speech:Clear and Coherent; Normal Rate  Speech Volume:Normal  Handedness:Right   Mood and Affect  Mood:Euthymic  Affect:Congruent   Thought Process  Thought Processes:Coherent; Goal Directed  Descriptions of Associations:Intact  Orientation:Full (Time, Place and Person)  Thought Content:Illogical; Logical  History of Schizophrenia/Schizoaffective disorder:Yes  Duration of Psychotic Symptoms:Greater than six months  Hallucinations:Hallucinations: None Description of Visual Hallucinations: "seeing things that aren't there"  Ideas of Reference:Delusions; Paranoia (Always reports mother is scraping her legs, arms and elbow with knifr.  No injury noted but area is dry from lying bed all the time.)  Suicidal Thoughts:Suicidal Thoughts: No SI Passive Intent and/or Plan: Without Intent; Without Plan  Homicidal Thoughts:Homicidal Thoughts: No HI Passive Intent and/or Plan: Without Plan   Sensorium  Memory:Immediate Fair; Recent Fair; Remote Poor  Judgment:Fair  Insight:Fair   Executive Functions  Concentration:Good  Attention Span:Good  [place]   Psychomotor Activity  Psychomotor Activity:Psychomotor Activity: Normal   Assets  Assets:Communication Skills; Desire for Improvement; Housing; Social Support; Transportation   Sleep  Sleep:Sleep: Good    Physical Exam: Physical Exam Vitals and nursing note reviewed.  Constitutional:      Appearance: Normal appearance.  HENT:     Head: Normocephalic and atraumatic.  Cardiovascular:     Rate and Rhythm: Normal rate.  Pulmonary:     Effort:  Pulmonary effort is normal.  Musculoskeletal:     Comments: Lower extremity weak, uses w/c-pain, unable to ambulate due to spinal issues  Declined Surgery she says  Skin:    General: Skin is warm and dry.  Neurological:     Mental Status: [name] alert and oriented to person, place, and time.    Review of Systems  Constitutional: Negative.   HENT: Negative.    Eyes: Negative.   Respiratory: Negative.    Cardiovascular: Negative.   Gastrointestinal: Negative.   Genitourinary: Negative.   Musculoskeletal:        Lower extremity weak, uses w/c-pain, unable to ambulate due to spinal issues  Declined Surgery she says   Skin: Negative.   Neurological: Negative.  Endo/Heme/Allergies: Negative.   Psychiatric/Behavioral:  Positive for depression. The patient is nervous/anxious.        Some substance abuse noted.   Blood pressure 120/82, pulse 94, temperature 98.2 F (36.8 C), resp. rate 17, height '5\' 2"'$  (1.575 m), weight 74.8 kg, SpO2 94 %. Body mass index is 30.18 kg/m.   Demographic Factors:  Low socioeconomic status, Living alone, and Unemployed  Loss Factors: NA  Historical Factors: NA  Risk Reduction Factors:   Positive social support  Continued Clinical Symptoms:  Alcohol/Substance Abuse/Dependencies More than one psychiatric diagnosis  Cognitive Features That Contribute To Risk:  None    Suicide Risk:  Minimal: No identifiable suicidal ideation.  Patients presenting with no risk factors but with morbid ruminations; may be classified as minimal risk based on the severity of the depressive symptoms    Plan Of Care/Follow-up recommendations:  Activity:  as tolerated Diet:  Regular  Medical Decision Making: Patient has Chronic Delusion towards her mother.  She reported that her family are the reasons why she is depressed and comes to the ER.  Review of PDMD pains a picture of a patient addicted to pain medications and other controlled substances.  Patient is  discharged with referral to pain clinic  and CDIOP to address her addiction.  Patient was discussed with DR Dwyane Dee who agrees with Provider for discharge plan.  Problem 1: Paranoid Delusion Problem 2: Schizoaffective disorder, Bipolar type  Problem 3: Anxiety disorder  Disposition: Discharge  Delfin Gant, NP=PMHNP=BC 07/18/2021, 1:08 PM

## 2021-07-18 NOTE — Discharge Instructions (Signed)
To help you maintain a sober lifestyle, a substance use disorder treatment program may be beneficial to you.  The providers listed below offer both individual counseling and Chemical Dependency Intensive Outpatient programs.  Contact them at your earliest opportunity to ask about enrolling their program:       [facility]. [employer]. [place], [place] [address]      Contact person: Adam Phenix, LCSW      [phone]       [facility]      [address] [place], [place] [address]      [phone]

## 2021-07-18 NOTE — BH Assessment (Signed)
[facility] Assessment Progress Note   Per Charmaine Downs, NP, this pt does not require psychiatric hospitalization at this time.  Pt presents under IVC initiated by a crisis counselor and upheld by EDP Gerlene Fee, MD, which has been rescinded by Hampton Abbot, MD.  Pt is psychiatrically cleared.  At the recommendation of Dr Dwyane Dee this writer spoke to pt about Chemical Dependency Intensive Outpatient Programs, however, pt is not willing to commit to enrolling in such a program at this time. Discharge instructions include referral information for programs of this kind, as well as individual substance use disorder counseling.  EDP Lennice Sites, DO and pt's nurses, [place] and [place], have been notified.  Jalene Mullet, [place] Triage Specialist [phone]

## 2021-07-18 NOTE — Patient Outreach (Signed)
  Care Management   Follow-Up Note   07/18/2021  Name: Tiffany [name]            MRN: [id number]       DOB: 1960-06-22   Referred By: [name], MD   Reason for Referral: Care Coordination Needs in Patient with Chronic Pain Syndrome, Altered Mental Status, Morbid Obesity, Tobacco Use Disorder, Seizures, Schizophrenia, Schizoaffective Disorder, Bipolar Type, Post-Traumatic Stress Disorder, Aggressive Behavior, Anxiety, Depression, Binge Eating Disorder, and Family Discord.   An unsuccessful telephone outreach was attempted today. The patient was referred to the case management team for assistance with care management and care coordination. HIPAA compliant messages were left on voicemail for patient, providing contact information, encouraging her to return LCSW's call at her earliest convenience.  LCSW will make a third initial telephone outreach call attempt within the next 5-7 business days, if a return call is not received from patient in the meantime.  LCSW noted that patient is currently hospitalized, as of 07/15/2021, due to complaints of Chronic Leg Pain, Failure to Thrive, and Delusional Disorder.  Patient is under involuntary commitment and awaiting skilled nursing facility placement.   Follow-Up Plan:  LCSW's third initial telephone outreach call attempt scheduled on 07/22/2021 at 10:30 am.   Tiffany [name], BSW, MSW, [place]  Licensed Clinical Social Worker  [facility]  Mailing [place]. [address] [place], [place] [address] Physical Address-[address] [place], [place] [address] Toll Free Main # [phone] Fax # [phone] Cell # [phone] [email or web address]

## 2021-07-20 ENCOUNTER — Other Ambulatory Visit: Payer: Self-pay | Admitting: *Deleted

## 2021-07-20 ENCOUNTER — Telehealth: Payer: Self-pay

## 2021-07-20 NOTE — Telephone Encounter (Signed)
   Telephone encounter was:  Unsuccessful.  07/20/2021 Name: Tiffany [name] MRN: [id number] DOB: Dec 15, 1960  Unsuccessful outbound call made today to assist with:  Food Insecurity and Financial Difficulties related to bills  Outreach Attempt:  1st Attempt f/u  A HIPAA compliant voice message was left requesting a return call.  Instructed patient to call back at [phone] at their earliest convenience.  [facility] management  [place], [place]  Main Phone: [phone]  E-mail: [email or web address]  Website: [email or web address]

## 2021-07-20 NOTE — Patient Outreach (Signed)
[facility]) Care Management Telephonic RN Care Manager Note   07/20/2021 Name:  Tiffany [name] MRN:  [id number] DOB:  23-Apr-1960  Summary: Tiffany [name] call placed to member, successful.  Denies any urgent concerns, encouraged to contact this care manager with questions.    Recommendations/Changes made from today's visit: Call [name] for additional community resources.   Subjective: Tiffany [name] is an 61 y.o. year old female who is a primary patient of Tiffany Rakes, MD. The care management team was consulted for assistance with care management and/or care coordination needs.    Telephonic RN Care Manager completed Telephone Visit today.  Objective:   Medications Reviewed Today     Reviewed by Paris Lore, CPhT (Pharmacy Technician) on 07/16/21 at [facility] List Status: Complete   Medication Order Taking? Sig Documenting Provider Last Dose Status Informant  albuterol (VENTOLIN HFA) 108 (90 Base) MCG/ACT inhaler [id number] Yes Inhale 2 puffs into the lungs every 6 (six) hours as needed for wheezing or shortness of breath. Tiffany Rakes, MD Past Week Active Self, Pharmacy Records  amLODipine (NORVASC) 5 MG tablet [id number] Yes Take 1 tablet (5 mg total) by mouth at bedtime.  Patient taking differently: Take 10 mg by mouth at bedtime.   Tiffany Rakes, MD 07/15/2021 Active Self, Pharmacy Records           Med Note Jimmey Ralph, [facility]   Sat Jul 09, 2021  8:53 PM)    Asenapine Maleate 10 MG SUBL [id number] Yes Place 1 tablet (10 mg total) under the tongue 2 (two) times daily. Shelly Coss, MD 07/15/2021 Active Self, Pharmacy Records           Med Note Daisy Lazar Jun 26, 2021  8:28 AM)    atorvastatin (LIPITOR) 20 MG tablet [id number] Yes Take 1 tablet (20 mg total) by mouth daily. Tiffany Rakes, MD 07/15/2021 Active Self, Pharmacy Records  carvedilol (COREG) 6.25 MG tablet [id number] Yes Take 1 tablet (6.25 mg total) by mouth 2 (two) times  daily with a meal. Tiffany Rakes, MD 07/15/2021 1400 Active Self, Pharmacy Records  colchicine 0.6 MG tablet [id number] Yes Take 2 tablets by mouth at the onset of gout flare, may repeat 1 tablet in 1 hour if symptoms persist  Patient taking differently: Take 0.6 mg by mouth daily.   Tiffany Rakes, MD 07/15/2021 Active Self, Pharmacy Records  cyclobenzaprine (FLEXERIL) 10 MG tablet [id number] Yes Take 10 mg by mouth 2 (two) times daily as needed. [provider] 07/15/2021 Active Self, Pharmacy Records  diazepam (VALIUM) 5 MG tablet [id number] Yes Take 1 tablet (5 mg total) by mouth daily as needed for anxiety. Bonnielee Haff, MD 07/15/2021 Active Self, Pharmacy Records  diclofenac Sodium (VOLTAREN) 1 % GEL [id number] Yes Apply 4 g topically 4 (four) times daily as needed (pain).  Patient taking differently: Apply 1 application  topically 3 (three) times daily.   Tiffany Rakes, MD 07/15/2021 Active Self, Pharmacy Records  dicyclomine (BENTYL) 10 MG capsule [id number] Yes Take 1 capsule (10 mg total) by mouth 3 (three) times daily before meals. Tiffany Rakes, MD 07/15/2021 Active Self, Pharmacy Records  DULoxetine (CYMBALTA) 30 MG capsule [id number] Yes Take 30 mg by mouth every morning. [provider] 07/15/2021 Active Self, Pharmacy Records           Med Note Jimmey Ralph, [facility]   Sat Jul 09, 2021  8:58 PM)    escitalopram (LEXAPRO) 10 MG tablet [id number] Yes Take 10  mg by mouth daily. [provider] 07/15/2021 Active Self, Pharmacy Records  febuxostat (ULORIC) 40 MG tablet [id number] Yes Take 40 mg by mouth every morning. [provider] 07/15/2021 Active Self, Pharmacy Records           Med Note Daisy Lazar Jun 26, 2021  8:33 AM)    Fluticasone-Umeclidin-Vilant (TRELEGY ELLIPTA) 100-62.5-25 MCG/ACT AEPB [id number] Yes Inhale 1 puff into the lungs daily. Tiffany Rakes, MD Past Week Active Self, Pharmacy Records  gabapentin (NEURONTIN) 800 MG tablet  [id number] Yes Take 800 mg by mouth daily. [provider] 07/15/2021 Active Self, Pharmacy Records  Iron, Ferrous Sulfate, 325 (65 Fe) MG TABS [id number] Yes Take 325 mg by mouth daily. Tiffany Rakes, MD 07/15/2021 Active Self, Pharmacy Records  loperamide (IMODIUM) 2 MG capsule [id number] Yes Take 2 capsules (4 mg total) by mouth 3 (three) times daily as needed for diarrhea or loose stools. Bonnielee Haff, MD 07/15/2021 Active Self, Pharmacy Records  methocarbamol (ROBAXIN) 500 MG tablet [id number] Yes Take 500 mg by mouth in the morning and at bedtime. [provider] 07/15/2021 Active Self, Pharmacy Records  montelukast (SINGULAIR) 10 MG tablet [id number] Yes Take 1 tablet (10 mg total) by mouth at bedtime. Mercy Riding, MD 07/15/2021 Active Self, Pharmacy Records           Med Note Jimmey Ralph, [facility] I   Sat Jul 09, 2021  9:01 PM)    omeprazole (PRILOSEC) 40 MG capsule [id number] Yes Take 1 capsule (40 mg total) by mouth every morning. Tiffany Rakes, MD 07/15/2021 Active Self, Pharmacy Records  oxybutynin (DITROPAN) 5 MG tablet [id number] Yes Take 1 tablet (5 mg total) by mouth 2 (two) times daily. Tiffany Rakes, MD 07/15/2021 Active Self, Pharmacy Records  oxyCODONE (OXY IR/ROXICODONE) 5 MG immediate release tablet [id number] Yes Take 1 tablet (5 mg total) by mouth every 6 (six) hours as needed for severe pain. Bonnielee Haff, MD 07/15/2021 Active Self, Pharmacy Records  potassium chloride SA (KLOR-CON M) 20 MEQ tablet [id number] Yes Take 1 tablet (20 mEq total) by mouth daily. Tiffany Rakes, MD 07/15/2021 Active Self, Pharmacy Records  prazosin (MINIPRESS) 2 MG capsule [id number] Yes Take 1 capsule (2 mg total) by mouth at bedtime. TAKE 1 CAPSULE(2 MG) BY MOUTH AT BEDTIME Strength: 2 mg  Patient taking differently: Take 2 mg by mouth at bedtime.   Tiffany Rakes, MD 07/15/2021 Active Self, Pharmacy Records  pregabalin (LYRICA) 75 MG capsule [id number] Yes Take 75 mg by mouth 2 (two)  times daily. [provider] 07/15/2021 Active Self, Pharmacy Records           Med Note Orvan Seen, [facility]   Fri Jun 17, 2021  8:24 AM) #60 filled 05/15/21 per bottle (#24 left)  saccharomyces boulardii (FLORASTOR) 250 MG capsule [id number] Yes Take 1 capsule (250 mg total) by mouth 2 (two) times daily for 14 days. Bonnielee Haff, MD 07/15/2021 Active Self, Pharmacy Records  terbinafine (LAMISIL AT) 1 % cream [id number] Yes Apply 1 application  topically 2 (two) times daily. Tiffany Rakes, MD Past Month Active Self, Pharmacy Records  torsemide (DEMADEX) 20 MG tablet [id number] Yes Take 1 tablet (20 mg total) by mouth daily. Tiffany Rakes, MD 07/15/2021 Active Self, Pharmacy Records  VYVANSE 10 MG capsule [id number] Yes Take 10 mg by mouth every morning. [provider] 07/15/2021 Active Self, Pharmacy Records  Med List Note Latina Craver, [facility] 05/19/20 1422): ,  SDOH:  (Social Determinants of Health) assessments and interventions performed:     Care Plan  Review of patient past medical history, allergies, medications, health status, including review of consultants reports, laboratory and other test data, was performed as part of comprehensive evaluation for care management services.   Care Plan : [facility] Plan of Care (Adult)  Updates made by [name], RN since 07/20/2021 12:00 AM     Problem: Care coordination needs related to multiple comorbidities   Priority: High     Long-Range Goal: Member will be able to adequately verbalize management of chronic comorbidities and have additional support in the home   Start Date: 06/23/2021  Expected End Date: 06/24/2022  This Visit's Progress: Not on track  Recent Progress: Not on track  Priority: High  Note:   Current Barriers:  Knowledge Deficits related to plan of care for management of CHF, COPD, and Bipolar Disorder  Care Coordination needs related to Limited social support, Transportation, Limited access to  food, Level of care concerns, ADL IADL limitations, Mental Health Concerns , and Inability to perform ADL's independently Lacks caregiver support Literacy barriers Transportation barriers  RNCM Clinical Goal(s):  Patient will verbalize understanding of plan for management of CHF, COPD, and Bipolar Disorder as evidenced by Patient verbalizing adequate plan of care continue to work with RN Care Manager to address care management and care coordination needs related to  CHF, COPD, and Bipolar Disorder as evidenced by adherence to CM Team Scheduled appointments work with pharmacist to address Limited social support, Transportation, Limited access to food, Level of care concerns, and ADL IADL limitations related toCHF, COPD, and Bipolar Disorder as evidenced by review or EMR and patient or pharmacist report work with Education officer, museum to address  related to the management of Limited social support, Transportation, Limited access to food, Level of care concerns, and ADL IADL limitations related to the management of CHF, COPD, and Bipolar Disorder as evidenced by review of EMR and patient or Education officer, museum report work with [employer] care guide to address needs related to  Limited social support, Transportation, Limited access to food, Level of care concerns, and ADL IADL limitations as evidenced by patient and/or community resource care guide support work with [place] to establish in home primary care as evidenced by reported engagement by [employer] not experience hospital admission as evidenced by review of EMR. Hospital Admissions in last 6 months = 5 experience decrease in ED visits as evidenced by EMR review.  ED visits in in last 6 months = 20  through collaboration with RN Care manager, provider, and care team.   Interventions: Inter-disciplinary care team collaboration (see longitudinal plan of care) Evaluation of current treatment plan related to  self management and patient's  adherence to plan as established by provider   Heart Failure Interventions:  (Status:  Condition stable.  Not addressed this visit.) Long Term Goal Provided education on low sodium diet Reviewed Heart Failure Action Plan in depth and provided written copy  COPD Interventions:  (Status:  Condition stable.  Not addressed this visit.) Long Term Goal Provided patient with basic written and verbal COPD education on self care/management/and exacerbation prevention Provided written and verbal instructions on pursed lip breathing and utilized returned demonstration as teach back  Patient Goals/Self-Care Activities: Take all medications as prescribed Attend all scheduled provider appointments keep legs up while sitting use salt in moderation develop a rescue plan eliminate smoking in my home develop a rescue plan follow rescue  plan if symptoms flare-up  Follow Up Plan:  The patient has been provided with contact information for the care management team and has been advised to call with any health related questions or concerns.    Update 6/21 - Member advised that [name] has attempted to contact her in regards to psych follow up, provider will follow up next week.  Member has been to the ED in the last week for leg pain, eloped prior to being seen.  [name] very sleepy today, difficult to understand, state she will call back.  Call placed to [facility] home health, confirms PT and OT has started but no other disciplines.  Advised that RN, SW, and aide should all be ordered.  Confirmed order was placed, they will add services to contract.     Update 6/28 - Readmitted to hospital 6/24-6/27 with AMS.  SNF was recommended again, but there was an issues with getting her placed.  There were initially 2 bed offers however they both were rescinded, unknown reason.  She was discharged home with home health through [facility].  Spoke with member today, she has not received call from [facility] yet, but aware that they will  be calling.  Remains unable to walk with no assistance in the home.  Recommended to have follow up with PCP within 2 weeks, call placed to Dr. Altamese Dilling office to request they take member as a new patient for home visits.  Demographics sent over to office (fax [phone]).  Member also aware that she will continue to have telephonic follow up from [facility], [name], and Pharmacist).     Update 7/5 - Member presented back to the ED 6/30-7/3 with Delusions, was placed on 72 hour IVC hold.  She was cleared by psych and recommended to attend Chemical Dependency Intensive Outpatient Program, which she has refused.  Spoke with member, state she does not feel she has a substance abuse program and has other issues going on that she is taking care of first.  Report she is active with her psychiatrist, unsure of when her next appointment is.  State she had visitor from [facility] yesterday for evaluation, however assessment was not complete and will be rescheduled (per member, she was not aware of visit and was not "prepared").  Advised again to call [name] and discuss needs, provided again with contact information.    Call placed to [facility] as discharge notes from 6/27 stated referral was sent.  Per Eliezer Lofts, they did not accept member due to complexity of case and member's psych history.  Recommended to have agency with psych nurse be involved.  Call placed to [facility] calls, Dr. Altamese Dilling office, spoke with Reanna. She report member is on their list of patients and they will be calling to schedule initial home visit.  Call also placed to [facility] home health as they were active prior to admission.  Per Anabel Halon, member is still active with them and has PT visit scheduled for this week.    Notified by CMA that member called triage line upset that she had surgery done on her legs while she was in the hospital without her consent.  Chart reviewed, no evidence of surgery or procedures found for the past several admissions.  Member  made aware, state something was done because her pain was improved and she felt "better than I have in a long time."  Again advised no procedures were done.  She had CT scans, x-rays, and blood work, but no procedures.  She  verbalized understanding.        Plan:  Telephone follow up appointment with care management team member scheduled for:  1 week The patient has been provided with contact information for the care management team and has been advised to call with any health related questions or concerns.   [name], RN, MSN, [place] Manager [phone]

## 2021-07-21 ENCOUNTER — Encounter: Payer: Self-pay | Admitting: *Deleted

## 2021-07-21 ENCOUNTER — Telehealth: Payer: Self-pay

## 2021-07-21 ENCOUNTER — Ambulatory Visit: Payer: Self-pay | Admitting: *Deleted

## 2021-07-21 DIAGNOSIS — G894 Chronic pain syndrome: Secondary | ICD-10-CM | POA: Diagnosis not present

## 2021-07-21 DIAGNOSIS — D638 Anemia in other chronic diseases classified elsewhere: Secondary | ICD-10-CM | POA: Diagnosis not present

## 2021-07-21 DIAGNOSIS — Z9181 History of falling: Secondary | ICD-10-CM | POA: Diagnosis not present

## 2021-07-21 DIAGNOSIS — I130 Hypertensive heart and chronic kidney disease with heart failure and stage 1 through stage 4 chronic kidney disease, or unspecified chronic kidney disease: Secondary | ICD-10-CM | POA: Diagnosis not present

## 2021-07-21 DIAGNOSIS — I5032 Chronic diastolic (congestive) heart failure: Secondary | ICD-10-CM | POA: Diagnosis not present

## 2021-07-21 DIAGNOSIS — F32A Depression, unspecified: Secondary | ICD-10-CM | POA: Diagnosis not present

## 2021-07-21 DIAGNOSIS — K529 Noninfective gastroenteritis and colitis, unspecified: Secondary | ICD-10-CM | POA: Diagnosis not present

## 2021-07-21 DIAGNOSIS — N1832 Chronic kidney disease, stage 3b: Secondary | ICD-10-CM | POA: Diagnosis not present

## 2021-07-21 DIAGNOSIS — J449 Chronic obstructive pulmonary disease, unspecified: Secondary | ICD-10-CM | POA: Diagnosis not present

## 2021-07-21 DIAGNOSIS — I2510 Atherosclerotic heart disease of native coronary artery without angina pectoris: Secondary | ICD-10-CM | POA: Diagnosis not present

## 2021-07-21 NOTE — Telephone Encounter (Signed)
   Telephone encounter was:  Successful.  07/21/2021 Name: Tiffany [name] MRN: [id number] DOB: [birth year]  Tiffany [name] is a 61 y.o. year old female who is a primary care patient of [name], MD . The community resource team was consulted for assistance with Food Insecurity, Home Modifications, and Financial Difficulties related to bills  Care guide performed the following interventions: Follow up call placed to the patient to discuss status of referral. CG inquired if pt received resources by mail yet and pt stated no. CG verified address again and pt advised she would like resources to go to a new address ([place] [phone]) as she has been hospitalized. CG is re-sending resources.  Mail enclosed: Home Modification Programs, Clinical biochemist and additional, The Aging Gracefully Information/Brochure.  Follow Up Plan:  Care guide will follow up with patient by phone over the next few days.  [place] management  [place], [place]  Main Phone: [phone]  E-mail: [email or web address]  Website: [email or web address]

## 2021-07-22 ENCOUNTER — Ambulatory Visit: Payer: Self-pay | Admitting: *Deleted

## 2021-07-23 DIAGNOSIS — I130 Hypertensive heart and chronic kidney disease with heart failure and stage 1 through stage 4 chronic kidney disease, or unspecified chronic kidney disease: Secondary | ICD-10-CM | POA: Diagnosis not present

## 2021-07-23 DIAGNOSIS — K529 Noninfective gastroenteritis and colitis, unspecified: Secondary | ICD-10-CM | POA: Diagnosis not present

## 2021-07-23 DIAGNOSIS — G894 Chronic pain syndrome: Secondary | ICD-10-CM | POA: Diagnosis not present

## 2021-07-23 DIAGNOSIS — J449 Chronic obstructive pulmonary disease, unspecified: Secondary | ICD-10-CM | POA: Diagnosis not present

## 2021-07-23 DIAGNOSIS — D638 Anemia in other chronic diseases classified elsewhere: Secondary | ICD-10-CM | POA: Diagnosis not present

## 2021-07-23 DIAGNOSIS — E119 Type 2 diabetes mellitus without complications: Secondary | ICD-10-CM | POA: Diagnosis not present

## 2021-07-23 DIAGNOSIS — I5032 Chronic diastolic (congestive) heart failure: Secondary | ICD-10-CM | POA: Diagnosis not present

## 2021-07-23 DIAGNOSIS — N1832 Chronic kidney disease, stage 3b: Secondary | ICD-10-CM | POA: Diagnosis not present

## 2021-07-23 DIAGNOSIS — Z9181 History of falling: Secondary | ICD-10-CM | POA: Diagnosis not present

## 2021-07-23 DIAGNOSIS — S92912D Unspecified fracture of left toe(s), subsequent encounter for fracture with routine healing: Secondary | ICD-10-CM | POA: Diagnosis not present

## 2021-07-23 DIAGNOSIS — I2510 Atherosclerotic heart disease of native coronary artery without angina pectoris: Secondary | ICD-10-CM | POA: Diagnosis not present

## 2021-07-23 DIAGNOSIS — F32A Depression, unspecified: Secondary | ICD-10-CM | POA: Diagnosis not present

## 2021-07-24 ENCOUNTER — Encounter: Payer: Self-pay | Admitting: *Deleted

## 2021-07-24 NOTE — Patient Instructions (Signed)
Visit Information   Thank you for taking time to visit with me today. Please don't hesitate to contact me if I can be of assistance to you before our next scheduled telephone appointment.  Following are the goals we discussed today:  Patient Goals/Self-Care Activities:  Begin working with [employer], on a bi-weekly basis, in an effort to obtain financial, transportation, food and nutritional services, durable medical equipment, and mental health counseling, services, resources, and referrals.   Review list of resources mailed to your home by LCSW on 07/21/2021, which include all of the following:   ~ Emergency Assistance Programs in [facility] ~ [place], [place] ~ [employer] of [employer] ~ Resources for Seniors ~ Land O'Lakes ~ Motorola ~ Hilton Hotels Information ~ [place] ~ [place] ~ The [place] in [place], [place] ~ The [place] in [place] ~ [place] Application  ~ Warden/ranger at [place] ~ [place], [place], and Substance Abuse Services ~ List of Therapists in [place] ~ List of Psychiatrists in [place] ~ [place] ~ [place] LCSW directly [phone]), if you have questions, need assistance, or if additional social work needs are identified in the near future.   Follow-Up Date:  07/29/2021 at 10:00 am   Please call the care guide team at [phone] if you need to cancel or reschedule your appointment.   If you are experiencing a Mental Health or [place] or need someone to talk to, please call the Suicide and  Crisis Lifeline: 988 call the [place] National Suicide Prevention Lifeline: (478)670-7004 or TTY: (906) 080-6096 TTY 6842689961) to talk to a trained counselor call 1-800-273-TALK (toll free, 24 hour hotline) go to [facility] [address] [place] [phone]) call the [facility]: [phone] call 911   Following is a copy of your full care plan:  Care Plan : LCSW Plan of Care  Updates made by Francis Gaines, LCSW since 07/24/2021 12:00 AM     Problem: Find Help in My Community.   Priority: High     Goal: Find Help in My Community.   Start Date: 07/21/2021  Expected End Date: 10/21/2021  This Visit's Progress: On track  Priority: High  Note:   Current Barriers:   Financial constraints related to only receiving Social Security Disability Income. Lack of transportation to and from physician appointments, now unable to drive. Lack of food and nutritional services. Lack of durable medical equipment to perform activities of ADL's/IADL's independently and consistently. Untreated mental health concerns. Lack of knowledge regarding community resources. Clinical Goals:  Patient will work with LCSW, to address needs related to finances, transportation, food and nutritional services, durable medical equipment, and mental health counseling, services, resources, and referrals. Interventions: Collaboration with Primary Care Physician, Dr. [name] regarding development and update of comprehensive plan of care, as evidenced by provider attestation and co-signature. Inter-disciplinary care team collaboration (see longitudinal plan of care). Patient interviewed and appropriate assessments performed.  Discussed plans with patient for ongoing care management follow-up, and provided direct contact information for care management team. Assisted patient with obtaining information about health plan benefits through [employer] and Adult Medicaid. Assessment of needs, barriers, agencies contacted,  as well as how impacting.  Clinical Interventions:  Reviewed various financial resources, discussed options, and provided patient with information on Emergency Assistance Programs in [place], [place], [employer], ARAMARK Corporation of [place], The Mosaic Company for [employer], and Land O'Lakes. Reviewed various transportation resources, discussed options, and provided patient with information on Transportation Options in [place], [employer] Brochure, [employer]ant, and information on how to apply for [employer]) Specialized [employer], and Application (Part A & B). Reviewed various food and nutritional resources, discussed options, and provided patient with information on Meals-on-Wheels - Family Services of the [place], [place] in [place], [place], The [place] in [employer], and information on how to apply for [employer]) Supplemental Nutrition Assistance Program, and [employer] Energy Company.  Reviewed various durable medical equipment resources, discussed options, and provided patient with information on how to obtain a hospital bed, hoyer lift, and rolling walker. Reviewed various mental health counseling resources, discussed options, and provided patient with information on Counseling Services at Department of Social Services, [place], Developmental Disabilities, and Substance Abuse Services, List of Therapists in [place], List of Psychiatrists in [place], [place], and [employer]. PHQ-2 and PHQ-9 Depression Screen completed, and results reviewed with patient. Suicidal Ideation/Homicidal  Ideation assessed - none present. Solution-Focused Strategies implemented. Active Listening/Reflection utilized. Emotional Support provided. Problem Solving/Task-Centered Solutions developed. Prescription medications reviewed with patient and compliance discussed. Quality of Sleep Assessed, and Sleep Hygiene Techniques promoted. Increase in actives/exercise encouraged. Caregiver Stress acknowledged. Patient Goals/Self-Care Activities:  Begin working with [employer], on a bi-weekly basis, in an effort to obtain financial, transportation, food and nutritional services, durable medical equipment, and mental health counseling, services, resources, and referrals.   Review list of resources mailed to your home by LCSW on 07/21/2021, which include all of the following:   ~ Emergency Assistance Programs in [facility] ~ [place], [place] ~ [employer] of [employer] ~ Resources for Seniors ~ Land O'Lakes ~ [employer]ola ~ Hilton Hotels Information ~ [place] ~ [place] ~ The [place] in [place], [place] ~ The [place] in [place] ~ [place] Application  ~ Warden/ranger at [place] ~ [place], [place], and Substance Abuse Services ~ List of Therapists in [place] ~ List of Psychiatrists in [place] ~ [place] ~ [place] LCSW directly [phone]), if you have questions, need assistance, or if additional social work needs are identified in the near future.   Follow-Up Date:  07/29/2021 at 10:00 am      Consent to CCM Services: Tiffany [name] was given  information about Chronic Care Management services including:  CCM service includes personalized support from designated clinical staff supervised by her physician, including individualized plan of care and coordination with other care providers 24/7 contact phone numbers for assistance for urgent and routine care needs. Service will only be billed when office clinical staff spend 20 minutes or more in a month to coordinate care. Only one practitioner may furnish and bill the service in a calendar month. The patient may stop  CCM services at any time (effective at the end of the month) by phone call to the office staff. The patient will be responsible for cost sharing (co-pay) of up to 20% of the service fee (after annual deductible is met).  Patient agreed to services and verbal consent obtained.   Patient verbalizes understanding of instructions and care plan provided today and agrees to view in [place]. Active MyChart status and patient understanding of how to access instructions and care plan via MyChart confirmed with patient.     Telephone follow up appointment with care management team member scheduled for:  07/29/2021 at 10:00 am.  [name], BSW, MSW, [place]  Licensed Clinical Social Worker  [place]  Mailing [place]. [address] [place], [place] [address] Physical Address-[address] [place], [place] [address] Toll Free Main # [phone] Fax # [phone] Cell # [phone] [email or web address]

## 2021-07-24 NOTE — Patient Outreach (Signed)
Care Management Clinical Social Work Note  07/24/2021 Name: Tiffany [name] MRN: [id number] DOB: 05-06-1960  Tiffany [name] is a 61 y.o. year old female who is a primary care patient of Tiffany Rakes, MD.  The Care Management team was consulted for assistance with chronic disease management and coordination needs.  Engaged with patient by telephone for initial visit in response to provider referral for social work chronic care management and care coordination [name]  Consent to [name]:  Tiffany [name] was given information about Care Management [name] today including:  Care Management [name] includes personalized support from designated clinical staff supervised by her physician, including individualized plan of care and coordination with other care providers 24/7 contact phone numbers for assistance for urgent and routine care needs. The patient may stop case management [name] at any time by phone call to the office staff.  Patient agreed to [name] and consent obtained.   Assessment: Review of patient past medical history, allergies, medications, and health status, including review of relevant consultants reports was performed today as part of a comprehensive evaluation and provision of chronic care management and care coordination [name].  SDOH (Social Determinants of Health) assessments and interventions performed:  SDOH Interventions    Flowsheet Row Most Recent Value  SDOH Interventions   Food Insecurity Interventions Intervention Not Indicated  Financial Strain Interventions Other (Comment)  Tiffany [name], Tiffany [name], and Tiffany [name] Interventions Intervention Not Indicated  Physical Activity Interventions Patient Refused  Stress Interventions Offered Tiffany [name] Resources, Provide Counseling, Other (Comment)  Tiffany [name] Provided]  Social Connections Interventions Other (Comment)  [Interested in receiving community  palliative care [name].]  [name] Interventions Converted Appointment to [employer], Patient Resources (Tiffany [name]), Payor Benefit, SCAT (Tiffany [name]), Other (Comment)  [Tiffany [name]]  Depression Interventions/Treatment  Referral to Psychiatry, Medication, Counseling        Advanced Directives Status: Not ready or willing to discuss.  Care Plan  Allergies  Allergen Reactions   Hydrocodone Shortness Of Breath   Latuda [Lurasidone Hcl] Anaphylaxis   Magnesium-Containing Compounds Anaphylaxis    Tolerated Ensure   Prednisone Anaphylaxis, Swelling and Other (See Comments)    Tongue swelling, lip swelling, throat swelling, per pt    Tramadol Anaphylaxis and Swelling   Codeine Nausea And Vomiting   Trazodone Other (See Comments)    paranoia   Topamax [Topiramate] Other (See Comments)    Increases paranoia   Sulfa Antibiotics Itching   Tape Rash    Outpatient Encounter Medications as of 07/21/2021  Medication Sig   albuterol (VENTOLIN HFA) 108 (90 Base) MCG/ACT inhaler Inhale 2 puffs into the lungs every 6 (six) hours as needed for wheezing or shortness of breath.   amLODipine (NORVASC) 5 MG tablet Take 1 tablet (5 mg total) by mouth at bedtime. (Patient taking differently: Take 10 mg by mouth at bedtime.)   Asenapine Maleate 10 MG SUBL Place 1 tablet (10 mg total) under the tongue 2 (two) times daily.   atorvastatin (LIPITOR) 20 MG tablet Take 1 tablet (20 mg total) by mouth daily.   carvedilol (COREG) 6.25 MG tablet Take 1 tablet (6.25 mg total) by mouth 2 (two) times daily with a meal.   colchicine 0.6 MG tablet Take 2 tablets by mouth at the onset of gout flare, may repeat 1 tablet in 1 hour if symptoms persist (Patient taking differently: Take 0.6 mg by mouth daily.)   cyclobenzaprine (FLEXERIL) 10 MG tablet Take 10 mg by  mouth 2 (two) times daily as needed.   diazepam (VALIUM) 5 MG tablet Take 1 tablet (5 mg total) by mouth  daily as needed for anxiety.   diclofenac Sodium (VOLTAREN) 1 % GEL Apply 4 g topically 4 (four) times daily as needed (pain). (Patient taking differently: Apply 1 application  topically 3 (three) times daily.)   dicyclomine (BENTYL) 10 MG capsule Take 1 capsule (10 mg total) by mouth 3 (three) times daily before meals.   DULoxetine (CYMBALTA) 30 MG capsule Take 30 mg by mouth every morning.   febuxostat (ULORIC) 40 MG tablet Take 40 mg by mouth every morning.   Fluticasone-Umeclidin-Vilant (TRELEGY ELLIPTA) 100-62.5-25 MCG/ACT AEPB Inhale 1 puff into the lungs daily.   Iron, Ferrous Sulfate, 325 (65 Fe) MG TABS Take 325 mg by mouth daily.   loperamide (IMODIUM) 2 MG capsule Take 2 capsules (4 mg total) by mouth 3 (three) times daily as needed for diarrhea or loose stools.   methocarbamol (ROBAXIN) 500 MG tablet Take 500 mg by mouth in the morning and at bedtime.   montelukast (SINGULAIR) 10 MG tablet Take 1 tablet (10 mg total) by mouth at bedtime.   omeprazole (PRILOSEC) 40 MG capsule Take 1 capsule (40 mg total) by mouth every morning.   oxybutynin (DITROPAN) 5 MG tablet Take 1 tablet (5 mg total) by mouth 2 (two) times daily.   potassium chloride SA (KLOR-CON M) 20 MEQ tablet Take 1 tablet (20 mEq total) by mouth daily.   prazosin (MINIPRESS) 2 MG capsule Take 1 capsule (2 mg total) by mouth at bedtime. TAKE 1 CAPSULE(2 MG) BY MOUTH AT BEDTIME Strength: 2 mg (Patient taking differently: Take 2 mg by mouth at bedtime.)   pregabalin (LYRICA) 75 MG capsule Take 1 capsule (75 mg total) by mouth 2 (two) times daily.   saccharomyces boulardii (FLORASTOR) 250 MG capsule Take 1 capsule (250 mg total) by mouth 2 (two) times daily for 14 days.   terbinafine (LAMISIL AT) 1 % cream Apply 1 application  topically 2 (two) times daily.   torsemide (DEMADEX) 20 MG tablet Take 1 tablet (20 mg total) by mouth daily.   No facility-administered encounter medications on file as of 07/21/2021.    Patient Active  Problem List   Diagnosis Date Noted   Recurrent syncope 07/09/2021   Chronic leg pain 07/09/2021   Dehydration 07/09/2021   Delusional disorder, persecutory type ([place]) 06/26/2021   Rhabdomyolysis 06/16/2021   Normocytic anemia 06/16/2021   Anxiety and depression 05/25/2021   Physical deconditioning 05/25/2021   Cauda equina compression ([place]) 04/24/2021   Hematochezia    Hyperlipidemia 03/31/2021   GI bleed 03/30/2021   Acute GI bleeding 03/29/2021   Atypical chest pain 01/13/2021   GERD (gastroesophageal reflux disease)    Neuropathy 11/18/2020   Pain in both feet 11/18/2020   Localized edema 11/18/2020   Encounter for medication management 11/17/2020   Protein-calorie malnutrition, severe 05/15/2020   Malnutrition of moderate degree 71/24/5809   Periumbilical abdominal pain    Dysphagia    Nausea vomiting and diarrhea    Generalized abdominal pain    E. coli UTI 98/33/8250   Acute metabolic encephalopathy 53/97/6734   Schizophrenia ([place]) 02/20/2020   Schizophrenia, schizo-affective ([place]) 19/37/9024   Metabolic acidosis, normal anion gap (NAG) 02/20/2020   Altered mental status    Hypocalcemia    AKI (acute kidney injury) ([place]) 02/18/2020   Pelvic mass in female 01/02/2020   Genital herpes 11/25/2019   Stage 2 skin  ulcer of sacral region ([place]) 10/28/2019   Acute renal failure superimposed on stage 3b chronic kidney disease ([place]) 10/27/2019   Family discord 02/04/2019   Aggressive behavior    PTSD (post-traumatic stress disorder) 05/27/2018   Schizoaffective disorder, bipolar type ([place]) 04/05/2018   Closed displaced fracture of fifth metacarpal bone 03/21/2018   Difficulty with speech 01/24/2018   Encephalopathy 11/21/2017   Drug abuse ([place]) 11/21/2017   Frequent falls 10/11/2017   Binge eating disorder    Dependence on continuous supplemental oxygen 05/14/2017   Gout 04/11/2017   Hypomagnesemia    Chronic kidney disease, stage 3b ([place]) 12/15/2016   Carotid artery  stenosis    Hypokalemia    Osteoarthritis 10/26/2016   Anoxic brain injury ([place]) 09/08/2016   Overactive bladder 06/07/2016   QT prolongation    OSA and COPD overlap syndrome ([place])    Arthritis    Essential hypertension 03/22/2016   Cocaine use disorder, severe, in sustained remission ([place]) 12/17/2015   History of drug abuse in remission ([place]) 11/28/2015   Hyponatremia 11/25/2015   Chronic diastolic congestive heart failure (HCC)    Chronic respiratory failure with hypoxia ([place]) 06/22/2015   Tobacco use disorder 07/22/2014   COPD (chronic obstructive pulmonary disease) ([place]) 07/08/2014   Seizure ([place]) 01/04/2013   Chronic pain syndrome 06/18/2012   Dyslipidemia 04/24/2011   Anemia 04/24/2011   Diabetic neuropathy ([place]) 04/24/2011   Morbid obesity ([place]) 10/18/2010   Type 2 diabetes mellitus ([place]) 10/18/2010    Conditions to be addressed/monitored: Anxiety and Depression.  Film/video editor, Limited Social Support, [name], Limited Access to [employer], Level of Care Concerns, ADL/IADL Limitations, Mental Health Concerns, Family and Relationship Dysfunction, Substance Abuse Issues, Social Isolation, Limited Access to Caregiver, Cognitive Deficits, Memory Deficits, and Lacks Knowledge of [employer].  Care Plan : LCSW Plan of Care  Updates made by Francis Gaines, LCSW since 07/24/2021 12:00 AM     Problem: Find Help in My Community.   Priority: High     Goal: Find Help in My Community.   Start Date: 07/21/2021  Expected End Date: 10/21/2021  This Visit's Progress: On track  Priority: High  Note:   Current Barriers:   Financial constraints related to only receiving Social Security Disability Income. Lack of [name] to and from physician appointments, now unable to drive. Lack of food and nutritional [name]. Lack of durable medical equipment to perform activities of ADL's/IADL's independently and consistently. Untreated mental health concerns. Lack of  knowledge regarding community resources. Clinical Goals:  Patient will work with LCSW, to address needs related to finances, [name], food and nutritional [name], durable medical equipment, and mental health counseling, [name], resources, and referrals. Interventions: Collaboration with Primary Care Physician, Dr. Charlott [name] regarding development and update of comprehensive plan of care, as evidenced by provider attestation and co-signature. Inter-disciplinary care team collaboration (see longitudinal plan of care). Patient interviewed and appropriate assessments performed.  Discussed plans with patient for ongoing care management follow-up, and provided direct contact information for care management team. Assisted patient with obtaining information about health plan benefits through [employer] and Adult Tiffany. Assessment of needs, barriers, agencies contacted, as well as how impacting.  Clinical Interventions:  Reviewed various financial resources, discussed options, and provided patient with information on Emergency Assistance Programs in [place], [place], [employer], [employer] of [place], [employer] for Tiffany [name], and Land [employer]Lakes. Reviewed various [name] resources, discussed options, and provided patient with information on [name]  Options in [place], [place] [employer] Brochure, [employer], and information on how to apply for [employer]) Tiffany [employer], and Application (Part A & B). Reviewed various food and nutritional resources, discussed options, and provided patient with information on Meals-on-Wheels - Family [name] of the [place], [place] in [place], [place], The [place] in [place], and information on how to apply for [employer]) Supplemental Nutrition Assistance Program, and [employer] [name] Company.  Reviewed various durable medical equipment resources, discussed options, and provided patient with information on how to obtain a hospital [name], hoyer lift, and rolling walker. Reviewed various mental health counseling resources, discussed options, and provided patient with information on Counseling [name] at Department of Social [name], [place], Developmental Disabilities, and Substance Abuse [name], List of Therapists in [place], List of Psychiatrists in [place], [place], and [employer]. PHQ-2 and PHQ-9 Depression Screen completed, and results reviewed with patient. Suicidal Ideation/Homicidal Ideation assessed - none present. Solution-Focused Strategies implemented. Active Listening/Reflection utilized. Emotional Support provided. Problem Solving/Task-Centered Solutions developed. Prescription medications reviewed with patient and compliance discussed. Quality of Sleep Assessed, and Sleep Hygiene Techniques promoted. Increase in actives/exercise encouraged. Caregiver Stress acknowledged. Patient Goals/Self-Care Activities:  Begin working with [employer], on a bi-weekly basis, in an effort to obtain financial, [name], food and nutritional [name], durable medical equipment, and mental health counseling, [name], resources, and referrals.   Review list of resources mailed to your home by LCSW on 07/21/2021, which include all of the following:   ~ Emergency Assistance Programs in [facility] ~ [place], [employer] ~ [employer] of [employer] ~ Resources for Seniors ~ Land O'Lakes ~ [employer]ola ~ Hilton Hotels Information ~  [place] ~ [place] ~ The [place] in [place], [place] ~ The [place] in [place] ~ [place] Application  ~ Warden/ranger at [place] ~ [place], [place], and Substance Abuse [name] ~ List of Therapists in [place] ~ List of Psychiatrists in [place] ~ [place] ~ [place] LCSW directly [phone]), if you have questions, need assistance, or if additional social work needs are identified in the near future.   Follow-Up Date:  07/29/2021 at 10:00 am    [name], BSW, MSW, [place]  Licensed Clinical Social Worker  [place]  Mailing [address]acey [address]. [address], [place], [place] [address] Physical Address-[address] [place], [place] [address] Toll Free Main # [phone] Fax # [phone] Cell # [phone] [email or web address]

## 2021-07-25 ENCOUNTER — Ambulatory Visit: Payer: Self-pay | Admitting: *Deleted

## 2021-07-25 NOTE — Patient Outreach (Signed)
[facility]) Care Management  07/25/2021  Tiffany [name] Aug 28, 1960 [id number]   Outgoing call placed to member, no answer, HIPAA compliant voice message left.  Will follow up within the next week.  [name], RN, MSN, [place] Manager [phone]

## 2021-07-26 ENCOUNTER — Encounter: Payer: Self-pay | Admitting: *Deleted

## 2021-07-26 DIAGNOSIS — G894 Chronic pain syndrome: Secondary | ICD-10-CM | POA: Diagnosis not present

## 2021-07-26 DIAGNOSIS — D638 Anemia in other chronic diseases classified elsewhere: Secondary | ICD-10-CM | POA: Diagnosis not present

## 2021-07-26 DIAGNOSIS — J449 Chronic obstructive pulmonary disease, unspecified: Secondary | ICD-10-CM | POA: Diagnosis not present

## 2021-07-26 DIAGNOSIS — I130 Hypertensive heart and chronic kidney disease with heart failure and stage 1 through stage 4 chronic kidney disease, or unspecified chronic kidney disease: Secondary | ICD-10-CM | POA: Diagnosis not present

## 2021-07-26 DIAGNOSIS — F32A Depression, unspecified: Secondary | ICD-10-CM | POA: Diagnosis not present

## 2021-07-26 DIAGNOSIS — K529 Noninfective gastroenteritis and colitis, unspecified: Secondary | ICD-10-CM | POA: Diagnosis not present

## 2021-07-26 DIAGNOSIS — I5032 Chronic diastolic (congestive) heart failure: Secondary | ICD-10-CM | POA: Diagnosis not present

## 2021-07-26 DIAGNOSIS — I2510 Atherosclerotic heart disease of native coronary artery without angina pectoris: Secondary | ICD-10-CM | POA: Diagnosis not present

## 2021-07-26 DIAGNOSIS — N1832 Chronic kidney disease, stage 3b: Secondary | ICD-10-CM | POA: Diagnosis not present

## 2021-07-26 DIAGNOSIS — Z9181 History of falling: Secondary | ICD-10-CM | POA: Diagnosis not present

## 2021-07-27 ENCOUNTER — Other Ambulatory Visit: Payer: Self-pay | Admitting: *Deleted

## 2021-07-27 ENCOUNTER — Ambulatory Visit: Payer: Self-pay | Admitting: *Deleted

## 2021-07-27 NOTE — Patient Outreach (Signed)
  Care Coordination   Follow Up Visit Note   07/27/2021 Name: NATALINA WIETING MRN: [id number] DOB: 19-Jan-1960  [name] is a 61 y.o. year old female who sees [name], MD for primary care. I spoke with  [name] by phone today  What matters to the patients health and wellness today?  Scheduling appointment with new PCP and speaking with [name].   Goals Addressed               This Visit's Progress     Establish with new PCP (pt-stated)        Care Coordination Interventions: Advised patient to Call Dr. Daphene Jaeger to schedule initial home visit with provider Provided education to patient re: importance of contact and close follow up with [name]         SDOH assessments and interventions completed:   No   Care Coordination Interventions Activated:  Yes Care Coordination Interventions:   Yes, provided  Follow up plan: Follow up call scheduled for within the next 3 months for care coordination  Encounter Outcome:  Pt. Visit Completed

## 2021-07-27 NOTE — Patient Outreach (Signed)
Care Management Clinical Social Work Note  07/27/2021 Name: Tiffany [name] MRN: [id number] DOB: 01-12-1961  Tiffany [name] is a 61 y.o. year old female who is a primary care patient of [name], MD.  The Care Management team was consulted for assistance with chronic disease management and coordination needs.  Engaged with patient by telephone for follow up visit in response to provider referral for social work chronic care management and care coordination services  Consent to Services:  Ms. Mckeen was given information about Care Management services today including:  Care Management services includes personalized support from designated clinical staff supervised by her physician, including individualized plan of care and coordination with other care providers 24/7 contact phone numbers for assistance for urgent and routine care needs. The patient may stop case management services at any time by phone call to the office staff.  Patient agreed to services and consent obtained.   Assessment: Review of patient past medical history, allergies, medications, and health status, including review of relevant consultants reports was performed today as part of a comprehensive evaluation and provision of chronic care management and care coordination services.  SDOH (Social Determinants of Health) assessments and interventions performed:    Advanced Directives Status: Not addressed in this encounter.  Care Plan  Allergies  Allergen Reactions   Hydrocodone Shortness Of Breath   Latuda [Lurasidone Hcl] Anaphylaxis   Magnesium-Containing Compounds Anaphylaxis    Tolerated Ensure   Prednisone Anaphylaxis, Swelling and Other (See Comments)    Tongue swelling, lip swelling, throat swelling, per pt    Tramadol Anaphylaxis and Swelling   Codeine Nausea And Vomiting   Trazodone Other (See Comments)    paranoia   Topamax [Topiramate] Other (See Comments)    Increases paranoia   Sulfa  Antibiotics Itching   Tape Rash    Outpatient Encounter Medications as of 07/27/2021  Medication Sig   albuterol (VENTOLIN HFA) 108 (90 Base) MCG/ACT inhaler Inhale 2 puffs into the lungs every 6 (six) hours as needed for wheezing or shortness of breath.   amLODipine (NORVASC) 5 MG tablet Take 1 tablet (5 mg total) by mouth at bedtime. (Patient taking differently: Take 10 mg by mouth at bedtime.)   Asenapine Maleate 10 MG SUBL Place 1 tablet (10 mg total) under the tongue 2 (two) times daily.   atorvastatin (LIPITOR) 20 MG tablet Take 1 tablet (20 mg total) by mouth daily.   carvedilol (COREG) 6.25 MG tablet Take 1 tablet (6.25 mg total) by mouth 2 (two) times daily with a meal.   colchicine 0.6 MG tablet Take 2 tablets by mouth at the onset of gout flare, may repeat 1 tablet in 1 hour if symptoms persist (Patient taking differently: Take 0.6 mg by mouth daily.)   cyclobenzaprine (FLEXERIL) 10 MG tablet Take 10 mg by mouth 2 (two) times daily as needed.   diazepam (VALIUM) 5 MG tablet Take 1 tablet (5 mg total) by mouth daily as needed for anxiety.   diclofenac Sodium (VOLTAREN) 1 % GEL Apply 4 g topically 4 (four) times daily as needed (pain). (Patient taking differently: Apply 1 application  topically 3 (three) times daily.)   dicyclomine (BENTYL) 10 MG capsule Take 1 capsule (10 mg total) by mouth 3 (three) times daily before meals.   DULoxetine (CYMBALTA) 30 MG capsule Take 30 mg by mouth every morning.   febuxostat (ULORIC) 40 MG tablet Take 40 mg by mouth every morning.   Fluticasone-Umeclidin-Vilant (TRELEGY ELLIPTA) 100-62.5-25 MCG/ACT AEPB Inhale  1 puff into the lungs daily.   Iron, Ferrous Sulfate, 325 (65 Fe) MG TABS Take 325 mg by mouth daily.   loperamide (IMODIUM) 2 MG capsule Take 2 capsules (4 mg total) by mouth 3 (three) times daily as needed for diarrhea or loose stools.   methocarbamol (ROBAXIN) 500 MG tablet Take 500 mg by mouth in the morning and at bedtime.   montelukast  (SINGULAIR) 10 MG tablet Take 1 tablet (10 mg total) by mouth at bedtime.   omeprazole (PRILOSEC) 40 MG capsule Take 1 capsule (40 mg total) by mouth every morning.   oxybutynin (DITROPAN) 5 MG tablet Take 1 tablet (5 mg total) by mouth 2 (two) times daily.   potassium chloride SA (KLOR-CON M) 20 MEQ tablet Take 1 tablet (20 mEq total) by mouth daily.   prazosin (MINIPRESS) 2 MG capsule Take 1 capsule (2 mg total) by mouth at bedtime. TAKE 1 CAPSULE(2 MG) BY MOUTH AT BEDTIME Strength: 2 mg (Patient taking differently: Take 2 mg by mouth at bedtime.)   pregabalin (LYRICA) 75 MG capsule Take 1 capsule (75 mg total) by mouth 2 (two) times daily.   terbinafine (LAMISIL AT) 1 % cream Apply 1 application  topically 2 (two) times daily.   torsemide (DEMADEX) 20 MG tablet Take 1 tablet (20 mg total) by mouth daily.   No facility-administered encounter medications on file as of 07/27/2021.    Patient Active Problem List   Diagnosis Date Noted   Recurrent syncope 07/09/2021   Chronic leg pain 07/09/2021   Dehydration 07/09/2021   Delusional disorder, persecutory type ([place]) 06/26/2021   Rhabdomyolysis 06/16/2021   Normocytic anemia 06/16/2021   Anxiety and depression 05/25/2021   Physical deconditioning 05/25/2021   Cauda equina compression ([place]) 04/24/2021   Hematochezia    Hyperlipidemia 03/31/2021   GI bleed 03/30/2021   Acute GI bleeding 03/29/2021   Atypical chest pain 01/13/2021   GERD (gastroesophageal reflux disease)    Neuropathy 11/18/2020   Pain in both feet 11/18/2020   Localized edema 11/18/2020   Encounter for medication management 11/17/2020   Protein-calorie malnutrition, severe 05/15/2020   Malnutrition of moderate degree 24/23/5361   Periumbilical abdominal pain    Dysphagia    Nausea vomiting and diarrhea    Generalized abdominal pain    E. coli UTI 44/31/5400   Acute metabolic encephalopathy 86/76/1950   Schizophrenia ([place]) 02/20/2020   Schizophrenia,  schizo-affective ([place]) 93/26/7124   Metabolic acidosis, normal anion gap (NAG) 02/20/2020   Altered mental status    Hypocalcemia    AKI (acute kidney injury) ([place]) 02/18/2020   Pelvic mass in female 01/02/2020   Genital herpes 11/25/2019   Stage 2 skin ulcer of sacral region ([place]) 10/28/2019   Acute renal failure superimposed on stage 3b chronic kidney disease ([place]) 10/27/2019   Family discord 02/04/2019   Aggressive behavior    PTSD (post-traumatic stress disorder) 05/27/2018   Schizoaffective disorder, bipolar type ([place]) 04/05/2018   Closed displaced fracture of fifth metacarpal bone 03/21/2018   Difficulty with speech 01/24/2018   Encephalopathy 11/21/2017   Drug abuse ([place]) 11/21/2017   Frequent falls 10/11/2017   Binge eating disorder    Dependence on continuous supplemental oxygen 05/14/2017   Gout 04/11/2017   Hypomagnesemia    Chronic kidney disease, stage 3b ([place]) 12/15/2016   Carotid artery stenosis    Hypokalemia    Osteoarthritis 10/26/2016   Anoxic brain injury ([place]) 09/08/2016   Overactive bladder 06/07/2016   QT prolongation  OSA and COPD overlap syndrome ([place])    Arthritis    Essential hypertension 03/22/2016   Cocaine use disorder, severe, in sustained remission ([place]) 12/17/2015   History of drug abuse in remission ([place]) 11/28/2015   Hyponatremia 11/25/2015   Chronic diastolic congestive heart failure (HCC)    Chronic respiratory failure with hypoxia ([place]) 06/22/2015   Tobacco use disorder 07/22/2014   COPD (chronic obstructive pulmonary disease) ([place]) 07/08/2014   Seizure ([place]) 01/04/2013   Chronic pain syndrome 06/18/2012   Dyslipidemia 04/24/2011   Anemia 04/24/2011   Diabetic neuropathy ([place]) 04/24/2011   Morbid obesity ([place]) 10/18/2010   Type 2 diabetes mellitus ([place]) 10/18/2010    Conditions to be addressed/monitored: HTN, COPD, and DMII.  Film/video editor, Limited Social Support, Transport planner, Nurse, adult to [employer], Housing Barriers,  Level of Care Concerns, ADL/IADL Limitations, Mental Health Concerns, Limited Access to Caregiver, and Teacher, English as a foreign language of [employer].  Care Plan : LCSW Plan of Care  Updates made by Francis Gaines, LCSW since 07/27/2021 12:00 AM     Problem: Find Help in My Community.   Priority: High     Goal: Find Help in My Community.   Start Date: 07/21/2021  Expected End Date: 10/21/2021  This Visit's Progress: On track  Recent Progress: On track  Priority: High  Note:   Current Barriers:   Financial constraints related to only receiving Social Security Disability Income. Lack of transportation to and from physician appointments, now unable to drive. Lack of food and nutritional services. Lack of durable medical equipment to perform activities of ADL's/IADL's independently and consistently. Untreated mental health concerns. Lack of knowledge regarding community resources. Clinical Goals:  Patient will work with LCSW, to address needs related to finances, transportation, food and nutritional services, durable medical equipment, and mental health counseling, services, resources, and referrals. Interventions: Collaboration with Primary Care Physician, Dr. [name] regarding development and update of comprehensive plan of care, as evidenced by provider attestation and co-signature. Inter-disciplinary care team collaboration (see longitudinal plan of care). Clinical Interventions:  Continued review of various financial resources, such as Emergency Assistance Programs in [place], [place], CBS Corporation, ARAMARK Corporation of [place], The Mosaic Company for Reliant Energy, and Land O'Lakes. Continued review of various transportation resources, such as Actuary in [place], Fortune Brands Brochure, Mirant, and information on how to apply  for Goldman Sachs) Specialized Centex Corporation, and Application (Part A & B). Continued review of various food and nutritional resources, such as Meals-on-Wheels - Winn-Dixie of the [place], Bland in [place], [place], The [place] in [place], and information on how to apply for AutoZone) Supplemental Nutrition Assistance Program, and DTE Energy Company.  Continued review of various durable medical equipment resources, such as a hospital bed, hoyer lift, and rolling walker. Continued review of various mental health counseling resources, such as Warden/ranger at [place], [place], Developmental Disabilities, and Substance Abuse Services, List of Therapists in [place], List of Psychiatrists in [place], Olivia Lopez de Gutierrez, and Stryker Corporation. Solution-Focused Strategies implemented. Active Listening/Reflection utilized. Emotional Support provided. Problem Solving/Task-Centered Solutions developed. Caregiver Stress acknowledged. Patient Goals/Self-Care Activities:  Continue working with [employer], on a bi-weekly basis, in an effort to obtain financial, transportation, food and nutritional services, durable medical equipment, and mental health counseling, services, resources,  and referrals.   Thorough review of the following resources:  ~ Emergency Assistance Programs in [place] ~ [place], [place] ~ ARAMARK Corporation of Brink's Company ~ Resources for Seniors ~ Land O'Lakes ~ Motorola ~ Hilton Hotels Information ~ [place] ~ [place] ~ The [place] in [place],  [place] ~ The [place] in [place] ~ [place] Application  ~ Warden/ranger at [place] ~ [place], Developmental Disabilities, and Substance Abuse Services ~ List of Therapists in [place] ~ List of Psychiatrists in [place] ~ [place] ~ [place] LCSW directly [phone]), if you have questions, need assistance, or if additional social work needs are identified in the near future.   Follow-Up Date:  08/02/2021 at 10:45 am    [name], BSW, MSW, [place]  Licensed Clinical Social Worker  [place]  Mailing [place] [address], [place], [place] [address] Physical Address-[address], [place], [place] [address] Toll Free Main # [phone] Fax # [phone] Cell # [phone] [email or web address]

## 2021-07-28 ENCOUNTER — Other Ambulatory Visit: Payer: Self-pay | Admitting: *Deleted

## 2021-07-28 DIAGNOSIS — F32A Depression, unspecified: Secondary | ICD-10-CM | POA: Diagnosis not present

## 2021-07-28 DIAGNOSIS — Z9181 History of falling: Secondary | ICD-10-CM | POA: Diagnosis not present

## 2021-07-28 DIAGNOSIS — I5032 Chronic diastolic (congestive) heart failure: Secondary | ICD-10-CM | POA: Diagnosis not present

## 2021-07-28 DIAGNOSIS — I2510 Atherosclerotic heart disease of native coronary artery without angina pectoris: Secondary | ICD-10-CM | POA: Diagnosis not present

## 2021-07-28 DIAGNOSIS — I130 Hypertensive heart and chronic kidney disease with heart failure and stage 1 through stage 4 chronic kidney disease, or unspecified chronic kidney disease: Secondary | ICD-10-CM | POA: Diagnosis not present

## 2021-07-28 DIAGNOSIS — G894 Chronic pain syndrome: Secondary | ICD-10-CM | POA: Diagnosis not present

## 2021-07-28 DIAGNOSIS — D638 Anemia in other chronic diseases classified elsewhere: Secondary | ICD-10-CM | POA: Diagnosis not present

## 2021-07-28 DIAGNOSIS — K529 Noninfective gastroenteritis and colitis, unspecified: Secondary | ICD-10-CM | POA: Diagnosis not present

## 2021-07-28 DIAGNOSIS — N1832 Chronic kidney disease, stage 3b: Secondary | ICD-10-CM | POA: Diagnosis not present

## 2021-07-28 DIAGNOSIS — J449 Chronic obstructive pulmonary disease, unspecified: Secondary | ICD-10-CM | POA: Diagnosis not present

## 2021-07-28 NOTE — Patient Outreach (Signed)
[facility]) Care Management  07/28/2021  Tiffany [name] May 24, 1960 [id number]   Member's case discussed with multidisciplinary team due to frequent hospitalizations and ED visits.  She has been provided with all resources needed to increase support in the home as she has repeatedly refused placement, not following through with other recommendations and community support.  She has been advised and encouraged to contact this care manager with questions.  Will close case at this time, [facility] nurse will perform TOC call if member has readmission.  [name], RN, MSN, [place] Manager [phone]

## 2021-07-29 ENCOUNTER — Ambulatory Visit: Payer: Medicare Other | Admitting: Physician Assistant

## 2021-07-29 ENCOUNTER — Encounter: Payer: Self-pay | Admitting: *Deleted

## 2021-07-29 ENCOUNTER — Other Ambulatory Visit: Payer: Self-pay | Admitting: Family Medicine

## 2021-07-29 NOTE — Telephone Encounter (Signed)
Requested medication (s) are due for refill today: yes  Requested medication (s) are on the active medication list: yes  Last refill:  n/a  Future visit scheduled: yes  Notes to clinic:  Unable to refill per protocol, last refill by another provider. Historical medication. Can not delegate.     Requested Prescriptions  Pending Prescriptions Disp Refills   methocarbamol (ROBAXIN) 500 MG tablet [Pharmacy Med Name: METHOCARBAMOL '500MG'$  TABLETS] 20 tablet     Sig: TAKE 1 TABLET(500 MG) BY MOUTH TWICE DAILY     Not Delegated - Analgesics:  Muscle Relaxants Failed - 07/29/2021  2:02 PM      Failed - This refill cannot be delegated      Passed - Valid encounter within last 6 months    Recent Outpatient Visits           4 weeks ago Frequent falls   [place], Enobong, MD   3 months ago Lumbar radiculopathy   [place], Enobong, MD   4 months ago Weakness of both lower extremities   [place], Enobong, MD   5 months ago Edema of both legs   [place], Zelda W, NP   6 months ago Hospital discharge follow-up   [place] Gildardo Pounds, NP

## 2021-08-01 ENCOUNTER — Telehealth: Payer: Self-pay

## 2021-08-01 ENCOUNTER — Encounter: Payer: Self-pay | Admitting: *Deleted

## 2021-08-01 DIAGNOSIS — K529 Noninfective gastroenteritis and colitis, unspecified: Secondary | ICD-10-CM | POA: Diagnosis not present

## 2021-08-01 DIAGNOSIS — J449 Chronic obstructive pulmonary disease, unspecified: Secondary | ICD-10-CM | POA: Diagnosis not present

## 2021-08-01 DIAGNOSIS — D638 Anemia in other chronic diseases classified elsewhere: Secondary | ICD-10-CM | POA: Diagnosis not present

## 2021-08-01 DIAGNOSIS — I130 Hypertensive heart and chronic kidney disease with heart failure and stage 1 through stage 4 chronic kidney disease, or unspecified chronic kidney disease: Secondary | ICD-10-CM | POA: Diagnosis not present

## 2021-08-01 DIAGNOSIS — I2510 Atherosclerotic heart disease of native coronary artery without angina pectoris: Secondary | ICD-10-CM | POA: Diagnosis not present

## 2021-08-01 DIAGNOSIS — F32A Depression, unspecified: Secondary | ICD-10-CM | POA: Diagnosis not present

## 2021-08-01 DIAGNOSIS — I5032 Chronic diastolic (congestive) heart failure: Secondary | ICD-10-CM | POA: Diagnosis not present

## 2021-08-01 DIAGNOSIS — G894 Chronic pain syndrome: Secondary | ICD-10-CM | POA: Diagnosis not present

## 2021-08-01 DIAGNOSIS — N1832 Chronic kidney disease, stage 3b: Secondary | ICD-10-CM | POA: Diagnosis not present

## 2021-08-01 DIAGNOSIS — Z9181 History of falling: Secondary | ICD-10-CM | POA: Diagnosis not present

## 2021-08-01 NOTE — Telephone Encounter (Signed)
   Telephone encounter was:  Unsuccessful.  08/01/2021 Name: Tiffany [name] MRN: [id number] DOB: 1960/09/27  Unsuccessful outbound call made today to assist with:  Food Insecurity, Home Modifications, and Financial Difficulties related to bills  Outreach Attempt:  1st Attempt f/u   A HIPAA compliant voice message was left requesting a return call.  Instructed patient to call back at [phone] at their earliest convenience.  [place] management  [place], [place]  Main Phone: [phone]  E-mail: [email or web address]  Website: [email or web address]

## 2021-08-01 NOTE — Telephone Encounter (Signed)
   Telephone encounter was:  Successful.  08/01/2021 Name: ALTAIR STANKO MRN: [id number] DOB: 07/13/1960  [name] is a 61 y.o. year old female who is a primary care patient of [name], MD . The community resource team was consulted for assistance with Food Insecurity, Home Modifications, and Financial Difficulties related to bills  Care guide performed the following interventions:  Patient advised she has not checked mail yet . CG will call back Friday.  Follow Up Plan:  Care guide will follow up with patient by phone over the next few days  [place], Tonsina Stall[place]  Main Phone: [phone]  E-mail: [email or web address]  Website: [email or web address]

## 2021-08-02 ENCOUNTER — Ambulatory Visit: Payer: Self-pay | Admitting: *Deleted

## 2021-08-02 ENCOUNTER — Encounter: Payer: Self-pay | Admitting: *Deleted

## 2021-08-02 ENCOUNTER — Other Ambulatory Visit: Payer: Self-pay | Admitting: *Deleted

## 2021-08-02 DIAGNOSIS — F32A Depression, unspecified: Secondary | ICD-10-CM | POA: Diagnosis not present

## 2021-08-02 DIAGNOSIS — N1832 Chronic kidney disease, stage 3b: Secondary | ICD-10-CM | POA: Diagnosis not present

## 2021-08-02 DIAGNOSIS — J449 Chronic obstructive pulmonary disease, unspecified: Secondary | ICD-10-CM | POA: Diagnosis not present

## 2021-08-02 DIAGNOSIS — I130 Hypertensive heart and chronic kidney disease with heart failure and stage 1 through stage 4 chronic kidney disease, or unspecified chronic kidney disease: Secondary | ICD-10-CM | POA: Diagnosis not present

## 2021-08-02 DIAGNOSIS — Z9181 History of falling: Secondary | ICD-10-CM | POA: Diagnosis not present

## 2021-08-02 DIAGNOSIS — K529 Noninfective gastroenteritis and colitis, unspecified: Secondary | ICD-10-CM | POA: Diagnosis not present

## 2021-08-02 DIAGNOSIS — G894 Chronic pain syndrome: Secondary | ICD-10-CM | POA: Diagnosis not present

## 2021-08-02 DIAGNOSIS — D638 Anemia in other chronic diseases classified elsewhere: Secondary | ICD-10-CM | POA: Diagnosis not present

## 2021-08-02 DIAGNOSIS — I5032 Chronic diastolic (congestive) heart failure: Secondary | ICD-10-CM | POA: Diagnosis not present

## 2021-08-02 DIAGNOSIS — I2510 Atherosclerotic heart disease of native coronary artery without angina pectoris: Secondary | ICD-10-CM | POA: Diagnosis not present

## 2021-08-02 NOTE — Patient Instructions (Signed)
Visit Information  Thank you for taking time to visit with me today. Please don't hesitate to contact me if I can be of assistance to you.   Following are the goals we discussed today:      Find Help in My Community. (pt-stated)   On track     Care Coordination Interventions:  Solution-Focused Strategies employed.  Active Listening / Reflection utilized. Emotional Support provided. Problem Solving /Task Centered Strategies reviewed. Brief Cognitive Behavioral Therapy initiated.   Reviewed mental health medications and discussed importance of compliance. Quality of sleep assessed and sleep hygiene techniques promoted.  Participation in counseling emphasized. Participation in support group introduced. Consideration of in-home help options discussed. Verbalization of feelings encouraged. Suicidal Ideation/Homicidal Ideation assessed - none present. Discussed referral for psychiatry and counseling services. Continued review of the following list of resources: ~ Emergency Assistance Programs in [place] ~ [place], [place] ~ ARAMARK Corporation of Brink's Company ~ Resources for Seniors ~ Land O'Lakes ~ Motorola ~ Hilton Hotels Information ~ [place] ~ [place] ~ The [place] in [place] ~ The [place] in [place] ~ Administrator, sports Nutrition Garment/textile technologist  ~ Warden/ranger at [place] ~ [place], Developmental Disabilities, and Substance Abuse Services ~ List of Therapists in [place] ~ List of Psychiatrists in [place] ~ [place] ~ [place] Date:  08/09/2021 at 11:15 am         Our  next appointment is by telephone on 08/09/2021 at 11:15 am.  Please call the care guide team at [phone] if you need to cancel or reschedule your appointment.   If you are experiencing a Mental Health or [place] or need someone to talk to, please call the Suicide and Crisis Lifeline: 988 call the [place] National Suicide Prevention Lifeline: 336-552-8703 or TTY: (276) 328-5668 TTY 5412256434) to talk to a trained counselor call 1-800-273-TALK (toll free, 24 hour hotline) go to [facility] [address] [place] [phone]) call the The [place]: 336-713-1147 call 911  Patient verbalizes understanding of instructions and care plan provided today and agrees to view in [place]. Active MyChart status and patient understanding of how to access instructions and care plan via MyChart confirmed with patient.     Telephone follow up appointment with care management team member scheduled for:  08/09/2021 at 11:15 am.  [name], BSW, MSW, [place]  Licensed Clinical Social Worker  [place]  Mailing [place]. [address] [place], [place] [address] Physical Address-[address], [place], [place] [address] Toll Free Main # [phone] Fax # [phone] Cell # [phone] [email or web address]

## 2021-08-02 NOTE — Patient Outreach (Signed)
[facility]) Care Management  08/02/2021  Tiffany [name] 1960/05/20 [id number]   Outgoing call placed to Rory Percy, nurse with [facility].  She left message inquiring about provider visiting the member's home.  Advised that this RNCM had contacted Dr. Altamese Dilling office to establish care in the home.  They were reaching out to member directly to schedule, unsure if this has happened.  Contact information for Dr. Daphene Jaeger provided to Ivin Booty, she will follow up with office.  No further needs identified.   [name], RN, MSN, [place] Manager [phone]

## 2021-08-02 NOTE — Patient Outreach (Signed)
  Care Coordination   Follow Up Visit Note   08/02/2021 Name: KEAIRRA BARDON MRN: [id number] DOB: 1960/07/15  [name] is a 61 y.o. year old female who sees [name], MD for primary care. I spoke with  [name] by phone today.  What matters to the patients health and wellness today?  Obtaining Resources in [employer].   Goals Addressed                            Find Help in My Community. (pt-stated)   On track     Care Coordination Interventions:  Solution-Focused Strategies employed.  Active Listening / Reflection utilized. Emotional Support provided. Problem Solving /Task Centered Strategies reviewed. Brief Cognitive Behavioral Therapy initiated.   Reviewed mental health medications and discussed importance of compliance. Quality of sleep assessed and sleep hygiene techniques promoted.  Participation in counseling emphasized. Participation in support group introduced. Consideration of in-home help options discussed. Verbalization of feelings encouraged. Suicidal Ideation/Homicidal Ideation assessed - none present. Discussed referral for psychiatry and counseling services. Continued review of the following list of resources: ~ Emergency Assistance Programs in [place] ~ [place], [place] ~ ARAMARK Corporation of Brink's Company ~ Resources for Seniors ~ Land O'Lakes ~ Motorola ~ Hilton Hotels Information ~ [place] ~ [place] ~ The [place] in [place] ~ The [place], [place] in [place] ~ Administrator, sports Nutrition Garment/textile technologist  ~ Warden/ranger at [place] ~ [place], Developmental Disabilities, and Substance Abuse Services ~  List of Therapists in [place] ~ List of Psychiatrists in [place] ~ [place] ~ [place] Date:  08/09/2021 at 11:15 am         SDOH assessments and interventions completed:   Yes   Care Coordination Interventions Activated:  Yes  Care Coordination Interventions:   Yes, provided.  Follow up plan: Follow up call scheduled for 08/09/2021 at 11:15 am.  Encounter Outcome:  Pt. Visit Completed.

## 2021-08-03 DIAGNOSIS — G894 Chronic pain syndrome: Secondary | ICD-10-CM | POA: Diagnosis not present

## 2021-08-03 DIAGNOSIS — E119 Type 2 diabetes mellitus without complications: Secondary | ICD-10-CM | POA: Diagnosis not present

## 2021-08-04 ENCOUNTER — Other Ambulatory Visit: Payer: Self-pay | Admitting: *Deleted

## 2021-08-04 ENCOUNTER — Telehealth: Payer: Self-pay

## 2021-08-04 DIAGNOSIS — Z9181 History of falling: Secondary | ICD-10-CM | POA: Diagnosis not present

## 2021-08-04 DIAGNOSIS — K529 Noninfective gastroenteritis and colitis, unspecified: Secondary | ICD-10-CM | POA: Diagnosis not present

## 2021-08-04 DIAGNOSIS — N1832 Chronic kidney disease, stage 3b: Secondary | ICD-10-CM | POA: Diagnosis not present

## 2021-08-04 DIAGNOSIS — I130 Hypertensive heart and chronic kidney disease with heart failure and stage 1 through stage 4 chronic kidney disease, or unspecified chronic kidney disease: Secondary | ICD-10-CM | POA: Diagnosis not present

## 2021-08-04 DIAGNOSIS — I5032 Chronic diastolic (congestive) heart failure: Secondary | ICD-10-CM | POA: Diagnosis not present

## 2021-08-04 DIAGNOSIS — J449 Chronic obstructive pulmonary disease, unspecified: Secondary | ICD-10-CM | POA: Diagnosis not present

## 2021-08-04 DIAGNOSIS — I2510 Atherosclerotic heart disease of native coronary artery without angina pectoris: Secondary | ICD-10-CM | POA: Diagnosis not present

## 2021-08-04 DIAGNOSIS — G894 Chronic pain syndrome: Secondary | ICD-10-CM | POA: Diagnosis not present

## 2021-08-04 DIAGNOSIS — F32A Depression, unspecified: Secondary | ICD-10-CM | POA: Diagnosis not present

## 2021-08-04 DIAGNOSIS — D638 Anemia in other chronic diseases classified elsewhere: Secondary | ICD-10-CM | POA: Diagnosis not present

## 2021-08-04 NOTE — Telephone Encounter (Signed)
   Telephone encounter was:  Successful.  08/04/2021 Name: Tiffany [name] MRN: [id number] DOB: February 12, 1960  [name] is a 61 y.o. year old female who is a primary care patient of [name], MD . The community resource team was consulted for assistance with Food Insecurity, Home Modifications, and Financial Difficulties related to bills  Care guide performed the following interventions: Follow up call placed to the patient to discuss status of referral. Mail has been received. Patient does not have any further questions or concerns at this time.  Follow Up Plan:  No further follow up planned at this time. The patient has been provided with needed resources.  [place] management  [place], [place]  Main Phone: [phone]  E-mail: [email or web address]  Website: [email or web address]

## 2021-08-04 NOTE — Patient Outreach (Signed)
  Care Coordination   Follow Up Visit Note   08/04/2021 Name: Tiffany [name] MRN: [id number] DOB: 03-10-1960  Tiffany [name] is a 61 y.o. year old female who sees [name], MD for primary care. I spoke with  Tiffany [name] by phone today  What matters to the patients health and wellness today?  "I have to be out of my house in 30 days.  I need help finding a place to stay."   Goals Addressed               This Visit's Progress     Find affordable housing (pt-stated)        Care Coordination Interventions: Advised patient to Contact social worker/case worker to get information on affordable housing         SDOH assessments and interventions completed:   No   Care Coordination Interventions Activated:  Yes Care Coordination Interventions:  Yes, provided  Follow up plan: Referral made to [name], referral already in place, advised to call for further instructions.  Encounter Outcome:  Pt. Visit Completed  [name], RN, MSN, [place] Manager [phone]

## 2021-08-08 ENCOUNTER — Telehealth: Payer: Self-pay | Admitting: Family Medicine

## 2021-08-08 ENCOUNTER — Other Ambulatory Visit: Payer: Self-pay | Admitting: Family Medicine

## 2021-08-08 DIAGNOSIS — N1832 Chronic kidney disease, stage 3b: Secondary | ICD-10-CM | POA: Diagnosis not present

## 2021-08-08 DIAGNOSIS — I2510 Atherosclerotic heart disease of native coronary artery without angina pectoris: Secondary | ICD-10-CM | POA: Diagnosis not present

## 2021-08-08 DIAGNOSIS — I5032 Chronic diastolic (congestive) heart failure: Secondary | ICD-10-CM | POA: Diagnosis not present

## 2021-08-08 DIAGNOSIS — I130 Hypertensive heart and chronic kidney disease with heart failure and stage 1 through stage 4 chronic kidney disease, or unspecified chronic kidney disease: Secondary | ICD-10-CM | POA: Diagnosis not present

## 2021-08-08 DIAGNOSIS — D638 Anemia in other chronic diseases classified elsewhere: Secondary | ICD-10-CM | POA: Diagnosis not present

## 2021-08-08 DIAGNOSIS — K529 Noninfective gastroenteritis and colitis, unspecified: Secondary | ICD-10-CM | POA: Diagnosis not present

## 2021-08-08 DIAGNOSIS — G894 Chronic pain syndrome: Secondary | ICD-10-CM | POA: Diagnosis not present

## 2021-08-08 DIAGNOSIS — F32A Depression, unspecified: Secondary | ICD-10-CM | POA: Diagnosis not present

## 2021-08-08 DIAGNOSIS — J449 Chronic obstructive pulmonary disease, unspecified: Secondary | ICD-10-CM | POA: Diagnosis not present

## 2021-08-08 DIAGNOSIS — Z9181 History of falling: Secondary | ICD-10-CM | POA: Diagnosis not present

## 2021-08-08 MED ORDER — DULOXETINE HCL 30 MG PO CPEP: 30.0000 mg | ORAL_CAPSULE | Freq: Every morning | ORAL | 2 refills | Status: DC

## 2021-08-08 NOTE — Telephone Encounter (Signed)
Copied from [address] [id number]. Topic: General - Other >> Aug 08, 2021  2:19 PM Everette C wrote: Reason for CRM: Medication Refill - Medication: DULoxetine (CYMBALTA) 30 MG capsule [[id number]]  - patient has 0 capsules remaining Patient has not had the medication since 08/04/21  Has the patient contacted their pharmacy? No. The patient was uncertain of the medication's name  (Agent: If no, request that the patient contact the pharmacy for the refill. If patient does not wish to contact the pharmacy document the reason why and proceed with request.) (Agent: If yes, when and what did the pharmacy advise?)  Preferred Pharmacy (with phone number or street name): [place], [place] [address] Phone: [phone] Fax: [phone] Hours: Not open 24 hours  Has the patient been seen for an appointment in the last year OR does the patient have an upcoming appointment? Yes.    Agent: Please be advised that RX refills may take up to 3 business days. We ask that you follow-up with your pharmacy.

## 2021-08-08 NOTE — Telephone Encounter (Signed)
Copied from [place] [id number]. Topic: General - Other >> Aug 08, 2021  2:23 PM Everette C wrote: Reason for CRM: Medication Refill - Medication: DULoxetine (CYMBALTA) 30 MG capsule [[id number]]  - patient has 0 capsules remaining Patient has not had the medication since 08/04/21  Has the patient contacted their pharmacy? No. The patient was uncertain of the medication's name  (Agent: If no, request that the patient contact the pharmacy for the refill. If patient does not wish to contact the pharmacy document the reason why and proceed with request.) (Agent: If yes, when and what did the pharmacy advise?)  Preferred Pharmacy (with phone number or street name): [place] [place] [address] Phone: [phone] Fax: [phone] Hours: Not open 24 hours  Has the patient been seen for an appointment in the last year OR does the patient have an upcoming appointment? Yes.    Agent: Please be advised that RX refills may take up to 3 business days. We ask that you follow-up with your pharmacy.

## 2021-08-08 NOTE — Telephone Encounter (Signed)
Requested medication (s) are due for refill today: Yes  Requested medication (s) are on the active medication list: Yes  Last refill:  03/30/21  Future visit scheduled: No  Notes to clinic:  Unable to refill per protocol, last refill by another provider. Patient out of med since 08/04/21     Requested Prescriptions  Pending Prescriptions Disp Refills   DULoxetine (CYMBALTA) 30 MG capsule      Sig: Take 1 capsule (30 mg total) by mouth every morning.     Psychiatry: Antidepressants - SNRI - duloxetine Passed - 08/08/2021  3:54 PM      Passed - Cr in normal range and within 360 days    Creat  Date Value Ref Range Status  02/18/2016 1.22 (H) 0.50 - 1.05 mg/dL Final    Comment:      For patients > or = 62 years of age: The upper reference limit for Creatinine is approximately 13% higher for people identified as African-American.      Creatinine, Ser  Date Value Ref Range Status  07/16/2021 1.00 0.44 - 1.00 mg/dL Final   Creatinine,U  Date Value Ref Range Status  06/17/2012 521.94 mg/dL Final    Comment:    Result confirmed by automatic dilution.    Cutoff Values for Urine Drug Screen, Pain Mgmt          Drug Class           Cutoff (ng/mL)          Amphetamines             500          Barbiturates             200          Cocaine Metabolites      150          Benzodiazepines          200          Methadone                300          Opiates                  300          Phencyclidine             25          Propoxyphene             300          Marijuana Metabolites     50    For medical purposes only.   Creatinine, Urine  Date Value Ref Range Status  02/19/2020 300.84 mg/dL Final    Comment:    Performed at [facility], [place] [address] [place], [place] [address]         Passed - eGFR is 30 or above and within 360 days    GFR, Est African American  Date Value Ref Range Status  02/18/2016 57 (L) >=60 mL/min Final   GFR calc Af Amer   Date Value Ref Range Status  11/25/2019 56 (L) >59 mL/min/1.73 Final    Comment:    **In accordance with recommendations from the NKF-ASN Task force,**   [facility] is in the process of updating its eGFR calculation to the   2021 CKD-EPI creatinine equation that estimates kidney function   without a race variable.    GFR, Est  Non African American  Date Value Ref Range Status  02/18/2016 50 (L) >=60 mL/min Final   GFR, Estimated  Date Value Ref Range Status  07/16/2021 >60 >60 mL/min Final    Comment:    (NOTE) Calculated using the CKD-EPI Creatinine Equation (2021)    eGFR  Date Value Ref Range Status  07/13/2020 21 (L) >59 mL/min/1.73 Final         Passed - Completed PHQ-2 or PHQ-9 in the last 360 days      Passed - Last BP in normal range    BP Readings from Last 1 Encounters:  07/18/21 120/82         Passed - Valid encounter within last 6 months    Recent Outpatient Visits           1 month ago Frequent falls   [place], Charlane Ferretti, MD   3 months ago Lumbar radiculopathy   [place], Enobong, MD   4 months ago Weakness of both lower extremities   [place], Enobong, MD   5 months ago Edema of both legs   [place] Gildardo Pounds, NP   6 months ago Hospital discharge follow-up   [place] Gildardo Pounds, NP

## 2021-08-09 ENCOUNTER — Encounter: Payer: Self-pay | Admitting: *Deleted

## 2021-08-09 ENCOUNTER — Other Ambulatory Visit: Payer: Self-pay | Admitting: Family Medicine

## 2021-08-09 ENCOUNTER — Ambulatory Visit: Payer: Self-pay | Admitting: *Deleted

## 2021-08-09 DIAGNOSIS — K529 Noninfective gastroenteritis and colitis, unspecified: Secondary | ICD-10-CM | POA: Diagnosis not present

## 2021-08-09 DIAGNOSIS — J449 Chronic obstructive pulmonary disease, unspecified: Secondary | ICD-10-CM | POA: Diagnosis not present

## 2021-08-09 DIAGNOSIS — Z9181 History of falling: Secondary | ICD-10-CM | POA: Diagnosis not present

## 2021-08-09 DIAGNOSIS — N1832 Chronic kidney disease, stage 3b: Secondary | ICD-10-CM | POA: Diagnosis not present

## 2021-08-09 DIAGNOSIS — I5032 Chronic diastolic (congestive) heart failure: Secondary | ICD-10-CM | POA: Diagnosis not present

## 2021-08-09 DIAGNOSIS — G894 Chronic pain syndrome: Secondary | ICD-10-CM | POA: Diagnosis not present

## 2021-08-09 DIAGNOSIS — I130 Hypertensive heart and chronic kidney disease with heart failure and stage 1 through stage 4 chronic kidney disease, or unspecified chronic kidney disease: Secondary | ICD-10-CM | POA: Diagnosis not present

## 2021-08-09 DIAGNOSIS — F32A Depression, unspecified: Secondary | ICD-10-CM | POA: Diagnosis not present

## 2021-08-09 DIAGNOSIS — D638 Anemia in other chronic diseases classified elsewhere: Secondary | ICD-10-CM | POA: Diagnosis not present

## 2021-08-09 DIAGNOSIS — I2510 Atherosclerotic heart disease of native coronary artery without angina pectoris: Secondary | ICD-10-CM | POA: Diagnosis not present

## 2021-08-09 NOTE — Patient Instructions (Signed)
Visit Information  Thank you for taking time to visit with me today. Please don't hesitate to contact me if I can be of assistance to you.   Following are the goals we discussed today:   Goals Addressed               This Visit's Progress     COMPLETED: Find Help in My Community. (pt-stated)   On track     Care Coordination Interventions:  Solution-Focused Strategies employed.  Active Listening / Reflection utilized. Emotional Support provided. Problem Solving /Task Centered Strategies reviewed. Brief Cognitive Behavioral Therapy initiated.   Reviewed mental health medications and discussed importance of compliance. Quality of sleep assessed and sleep hygiene techniques promoted.  Participation in counseling emphasized. Participation in support group introduced. Consideration of in-home help options discussed. Verbalization of feelings encouraged. Suicidal Ideation/Homicidal Ideation assessed - none present. Discussed referral for psychiatry and counseling services. Begin utilizing the following list of resources provided: ~ Emergency Assistance Programs in [place] ~ [place], [place] ~ [employer] ~ Resources for Seniors ~ [employer] ~ [employer] ~ [employer] Information ~ [place] ~ [employer] ~ The [place] in [place] ~ The [place] in [place] ~ Administrator, sports Nutrition Garment/textile technologist  ~ Warden/ranger at [place] ~ [place], Developmental Disabilities, and Substance Abuse Services ~ List of Therapists in [place] ~ List of Psychiatrists in [place] ~ [place] ~ [employer]       No further follow up required.  Please call the care guide team at [phone] if you need to cancel or reschedule your appointment.   If you are experiencing a Mental Health or [place] or need someone to talk to, please call the Suicide and Crisis Lifeline: 988 call the [place] National Suicide Prevention Lifeline: 716 262 1283 or TTY: (930)603-6856 TTY 2061461631) to talk to a trained counselor call 1-800-273-TALK (toll free, 24 hour hotline) go to [facility] [address], [place] [phone]) call the [place]: [phone] call 911  Patient verbalizes understanding of instructions and care plan provided today and agrees to view in [place]. Active MyChart status and patient understanding of how to access instructions and care plan via MyChart confirmed with patient.     No further follow up required.  [name], BSW, MSW, LCSW  Licensed Education officer, environmental Health System  Mailing [place] [address] [place], [place] [address] Physical Address-[address], [place], [place] [address] Toll Free Main # [phone] Fax # [phone] Cell # [phone] [email or web address]

## 2021-08-09 NOTE — Telephone Encounter (Signed)
Medication Refill - Medication: pregabalin (LYRICA) 75 MG capsule  AND methocarbamol (ROBAXIN) 500 MG tablet 30 tablet Patient states was told by pharmacy at [facility] that [place] isnt there, but I show it is, but nothing recent for  Pregablin. Has the patient contacted their pharmacy? yes (Agent: If no, request that the patient contact the pharmacy for the refill. If patient does not wish to contact the pharmacy document the reason why and proceed with request.) (Agent: If yes, when and what did the pharmacy advise?)contact pcp  Preferred Pharmacy (with phone number or street name):  [facility] [phone] - [place], [place] [address] [place] Phone:  [phone]  Fax:  [phone]     Has the patient been seen for an appointment in the last year OR does the patient have an upcoming appointment? yes  Agent: Please be advised that RX refills may take up to 3 business days. We ask that you follow-up with your pharmacy.

## 2021-08-09 NOTE — Telephone Encounter (Signed)
[place] called and spoke to [place], [facility] about the refill(s) Robaxin and Pregabalin requested. She says she told the patient Robaxin is ready for pickup and the Pregabalin will need a new Rx for next weeks refill, she already picked one up on 07/20/21 for a 30 day supply.

## 2021-08-09 NOTE — Patient Outreach (Signed)
  Care Coordination   Follow Up Visit Note   08/09/2021 Name: SHATASIA CUTSHAW MRN: [id number] DOB: 28-Jun-1960  [name] is a 61 y.o. year old female who sees [name], MD for primary care. I spoke with  [name] by phone today.  What matters to the patients health and wellness today?  Obtaining Available Services and Resources in the [employer].   Goals Addressed               This Visit's Progress     COMPLETED: Find Help in My Community. (pt-stated)   On track     Care Coordination Interventions:  Solution-Focused Strategies employed.  Active Listening / Reflection utilized. Emotional Support provided. Problem Solving /Task Centered Strategies reviewed. Brief Cognitive Behavioral Therapy initiated.   Reviewed mental health medications and discussed importance of compliance. Quality of sleep assessed and sleep hygiene techniques promoted.  Participation in counseling emphasized. Participation in support group introduced. Consideration of in-home help options discussed. Verbalization of feelings encouraged. Suicidal Ideation/Homicidal Ideation assessed - none present. Discussed referral for psychiatry and counseling services. Begin utilizing the following list of resources provided: ~ Emergency Assistance Programs in [place] ~ [place], [place] ~ [employer] ~ Resources for Seniors ~ [employer] ~ [employer] ~ [employer] Information ~ [place] ~ [place] ~ The [place] in [place] ~ The [place] in [place] ~ Administrator, sports Nutrition Garment/textile technologist  ~ Warden/ranger at [place] ~ [place],  Developmental Disabilities, and Substance Abuse Services ~ List of Therapists in [place] ~ List of Psychiatrists in [place] ~ [place] ~ [place]        SDOH assessments and interventions completed:   Yes  Care Coordination Interventions Activated:  Yes  Care Coordination Interventions:  Yes, provided.  Follow up plan: No further intervention required.  Encounter Outcome:  Pt. Visit Completed.  [name], BSW, MSW, LCSW  Licensed Education officer, environmental Health System  Mailing [place] [address] [place], [place] [address] Physical Address-[address], [place], [place] [address] Toll Free Main # [phone] Fax # [phone] Cell # [phone] [email or web address]

## 2021-08-11 DIAGNOSIS — I5032 Chronic diastolic (congestive) heart failure: Secondary | ICD-10-CM | POA: Diagnosis not present

## 2021-08-11 DIAGNOSIS — Z9181 History of falling: Secondary | ICD-10-CM | POA: Diagnosis not present

## 2021-08-11 DIAGNOSIS — K529 Noninfective gastroenteritis and colitis, unspecified: Secondary | ICD-10-CM | POA: Diagnosis not present

## 2021-08-11 DIAGNOSIS — D638 Anemia in other chronic diseases classified elsewhere: Secondary | ICD-10-CM | POA: Diagnosis not present

## 2021-08-11 DIAGNOSIS — I130 Hypertensive heart and chronic kidney disease with heart failure and stage 1 through stage 4 chronic kidney disease, or unspecified chronic kidney disease: Secondary | ICD-10-CM | POA: Diagnosis not present

## 2021-08-11 DIAGNOSIS — J449 Chronic obstructive pulmonary disease, unspecified: Secondary | ICD-10-CM | POA: Diagnosis not present

## 2021-08-11 DIAGNOSIS — F32A Depression, unspecified: Secondary | ICD-10-CM | POA: Diagnosis not present

## 2021-08-11 DIAGNOSIS — G894 Chronic pain syndrome: Secondary | ICD-10-CM | POA: Diagnosis not present

## 2021-08-11 DIAGNOSIS — N1832 Chronic kidney disease, stage 3b: Secondary | ICD-10-CM | POA: Diagnosis not present

## 2021-08-11 DIAGNOSIS — I2510 Atherosclerotic heart disease of native coronary artery without angina pectoris: Secondary | ICD-10-CM | POA: Diagnosis not present

## 2021-08-11 NOTE — Telephone Encounter (Signed)
Requested medication (s) are due for refill today: Early  Requested medication (s) are on the active medication list: Yes  Last refill:  07/18/21  Future visit scheduled: No  Notes to clinic:  Last filled by Charmaine Downs NP.    Requested Prescriptions  Pending Prescriptions Disp Refills   pregabalin (LYRICA) 75 MG capsule 60 capsule 0    Sig: Take 1 capsule (75 mg total) by mouth 2 (two) times daily.     Not Delegated - Neurology:  Anticonvulsants - Controlled - pregabalin Failed - 08/09/2021  5:20 PM      Failed - This refill cannot be delegated      Passed - Cr in normal range and within 360 days    Creat  Date Value Ref Range Status  02/18/2016 1.22 (H) 0.50 - 1.05 mg/dL Final    Comment:      For patients > or = 62 years of age: The upper reference limit for Creatinine is approximately 13% higher for people identified as African-American.      Creatinine, Ser  Date Value Ref Range Status  07/16/2021 1.00 0.44 - 1.00 mg/dL Final   Creatinine,U  Date Value Ref Range Status  06/17/2012 521.94 mg/dL Final    Comment:    Result confirmed by automatic dilution.    Cutoff Values for Urine Drug Screen, Pain Mgmt          Drug Class           Cutoff (ng/mL)          Amphetamines             500          Barbiturates             200          Cocaine Metabolites      150          Benzodiazepines          200          Methadone                300          Opiates                  300          Phencyclidine             25          Propoxyphene             300          Marijuana Metabolites     50    For medical purposes only.   Creatinine, Urine  Date Value Ref Range Status  02/19/2020 300.84 mg/dL Final    Comment:    Performed at [facility], [place] [address] [place], [place] [address]         Passed - Completed PHQ-2 or PHQ-9 in the last 360 days      Passed - Valid encounter within last 12 months    Recent Outpatient Visits            1 month ago Frequent falls   [place], Enobong, MD   3 months ago Lumbar radiculopathy   [place], Enobong, MD   4 months ago Weakness of both lower extremities   [place],  Charlane Ferretti, MD   5 months ago Edema of both legs   [place], Zelda W, NP   6 months ago Hospital discharge follow-up   [place] Gildardo Pounds, NP

## 2021-08-12 ENCOUNTER — Ambulatory Visit: Payer: Self-pay

## 2021-08-12 ENCOUNTER — Other Ambulatory Visit: Payer: Self-pay | Admitting: Family Medicine

## 2021-08-12 ENCOUNTER — Telehealth: Payer: Self-pay | Admitting: Family Medicine

## 2021-08-12 DIAGNOSIS — M109 Gout, unspecified: Secondary | ICD-10-CM

## 2021-08-12 DIAGNOSIS — G8929 Other chronic pain: Secondary | ICD-10-CM

## 2021-08-12 NOTE — Telephone Encounter (Signed)
  Chief Complaint: leg pain, med refill Symptoms: bilat leg pain, 10/10, swelling, pt upset and crying Frequency:  Pertinent Negatives: NA Disposition: '[]'$ ED /'[]'$ Urgent Care (no appt availability in office) / '[]'$ Appointment(In office/virtual)/ '[]'$  [place] Virtual [facility]/ '[]'$ Home Care/ '[]'$ Refused Recommended Disposition /'[]'$ [place] Mobile [facility]/ '[x]'$  Follow-up with PCP Additional Notes: pt calling upset and needing refill on Lyrica for leg pain. I advised her request was already sent to Dr. Margarita Rana and it was last prescribed by another provider. Pt states she doesn't have another dr and it might have been from ED but pt is needing it refilled to help with pain for nerve damage. I advised her I would send message to Dr. Margarita Rana but if it wasn't prescribed she could go to ED or UC to be seen for pain. Pt states she is unable to go to ED or UC d/t being home bound and no transportation.   Reason for Disposition  [1] Prescription refill request for ESSENTIAL medicine (i.e., likelihood of harm to patient if not taken) AND [2] triager unable to refill per department policy  Answer Assessment - Initial Assessment Questions 1. DRUG NAME: "What medicine do you need to have refilled?"     Lyrica 2. REFILLS REMAINING: "How many refills are remaining?" (Note: The label on the medicine or pill bottle will show how many refills are remaining. If there are no refills remaining, then a renewal may be needed.)     0  4. PRESCRIBING HCP: "Who prescribed it?" Reason: If prescribed by specialist, call should be referred to that group.     Dr. Margarita Rana 5. SYMPTOMS: "Do you have any symptoms?"     Leg pain, both LE, swollen 10/10 pain  Protocols used: Medication Refill and Renewal Call-A-AH

## 2021-08-12 NOTE — Telephone Encounter (Signed)
Medication Refill - Medication: pregabalin (LYRICA) 75 MG capsule  Has the patient contacted their pharmacy? Yes.     Preferred Pharmacy (with phone number or street name):  [place], [place] Phone:  [phone]  Fax:  [phone]     Has the patient been seen for an appointment in the last year OR does the patient have an upcoming appointment? Yes.

## 2021-08-12 NOTE — Telephone Encounter (Addendum)
She has been receiving Lyrica from Dr Ayesha Rumpf and has never received it from me. [name] also on multiple other controlled medications hence she needs to contact the prescriber for refills. In addition, last Prescription was on 07/20/21, 60 tabs - this is too early to request refill. I have referred her to pain management.

## 2021-08-12 NOTE — Telephone Encounter (Signed)
Requested medication (s) are due for refill today: early  Requested medication (s) are on the active medication list: yes  Last refill:  07/18/21 #60/0  Future visit scheduled: no  Notes to clinic:  Unable to refill per protocol, cannot delegate. Pt is requesting fu     Requested Prescriptions  Pending Prescriptions Disp Refills   pregabalin (LYRICA) 75 MG capsule 60 capsule 0    Sig: Take 1 capsule (75 mg total) by mouth 2 (two) times daily.     Not Delegated - Neurology:  Anticonvulsants - Controlled - pregabalin Failed - 08/12/2021  4:23 PM      Failed - This refill cannot be delegated      Passed - Cr in normal range and within 360 days    Creat  Date Value Ref Range Status  02/18/2016 1.22 (H) 0.50 - 1.05 mg/dL Final    Comment:      For patients > or = 62 years of age: The upper reference limit for Creatinine is approximately 13% higher for people identified as African-American.      Creatinine, Ser  Date Value Ref Range Status  07/16/2021 1.00 0.44 - 1.00 mg/dL Final   Creatinine,U  Date Value Ref Range Status  06/17/2012 521.94 mg/dL Final    Comment:    Result confirmed by automatic dilution.    Cutoff Values for Urine Drug Screen, Pain Mgmt          Drug Class           Cutoff (ng/mL)          Amphetamines             500          Barbiturates             200          Cocaine Metabolites      150          Benzodiazepines          200          Methadone                300          Opiates                  300          Phencyclidine             25          Propoxyphene             300          Marijuana Metabolites     50    For medical purposes only.   Creatinine, Urine  Date Value Ref Range Status  02/19/2020 300.84 mg/dL Final    Comment:    Performed at [facility], [place] [address] [place], [place] [address]         Passed - Completed PHQ-2 or PHQ-9 in the last 360 days      Passed - Valid encounter within last 12 months     Recent Outpatient Visits           1 month ago Frequent falls   [place], Enobong, MD   3 months ago Lumbar radiculopathy   [place], Charlane Ferretti, MD   4 months ago Weakness of both lower extremities  [place], Enobong, MD   5 months ago Edema of both legs   [place], Zelda W, NP   6 months ago Hospital discharge follow-up   [place] Gildardo Pounds, NP

## 2021-08-12 NOTE — Telephone Encounter (Signed)
Call dropped before transfer - Called pt [id number].

## 2021-08-12 NOTE — Telephone Encounter (Signed)
Pt called to see if the RX for Pregabalin was sent to the pharmacy / pt stated she is out of this and needs it asap / she stated she took all her meds to the hospital when she went and some of her meds didn't return home with her when discharged / please advise asap

## 2021-08-12 NOTE — Addendum Note (Signed)
Addended by: [name] on: 08/12/2021 06:38 PM   Modules accepted: Orders

## 2021-08-15 NOTE — Telephone Encounter (Signed)
Requested Prescriptions  Pending Prescriptions Disp Refills  . colchicine 0.6 MG tablet [Pharmacy Med Name: COLCHICINE 0.6 MG ORAL TABLET] 30 tablet 1    Sig: TAKE 2 TABS (1.2 MG) ORALLY AT THE ONSET OF GOUT FLARE, MAY REPEAT 1 TAB (0.6 MG) 1 HOUR IF SYMPTOMS PERSIST     Endocrinology:  Gout Agents - colchicine Passed - 08/12/2021  5:27 PM      Passed - Cr in normal range and within 360 days    Creat  Date Value Ref Range Status  02/18/2016 1.22 (H) 0.50 - 1.05 mg/dL Final    Comment:      For patients > or = 62 years of age: The upper reference limit for Creatinine is approximately 13% higher for people identified as African-American.      Creatinine, Ser  Date Value Ref Range Status  07/16/2021 1.00 0.44 - 1.00 mg/dL Final   Creatinine,U  Date Value Ref Range Status  06/17/2012 521.94 mg/dL Final    Comment:    Result confirmed by automatic dilution.    Cutoff Values for Urine Drug Screen, Pain Mgmt          Drug Class           Cutoff (ng/mL)          Amphetamines             500          Barbiturates             200          Cocaine Metabolites      150          Benzodiazepines          200          Methadone                300          Opiates                  300          Phencyclidine             25          Propoxyphene             300          Marijuana Metabolites     50    For medical purposes only.   Creatinine, Urine  Date Value Ref Range Status  02/19/2020 300.84 mg/dL Final    Comment:    Performed at [facility], [place] [address], [place], [place] [address]         Passed - ALT in normal range and within 360 days    ALT  Date Value Ref Range Status  07/09/2021 17 0 - 44 U/L Final         Passed - AST in normal range and within 360 days    AST  Date Value Ref Range Status  07/09/2021 26 15 - 41 U/L Final         Passed - Valid encounter within last 12 months    Recent Outpatient Visits          1 month ago Frequent falls    [place], Enobong, MD   3 months ago Lumbar radiculopathy   [place], Charlane Ferretti, MD   4 months ago Weakness  of both lower extremities   [place], [place], MD   5 months ago Edema of both legs   [place], Vernia Buff, NP   6 months ago Hospital discharge follow-up   [place], Vernia Buff, NP             Passed - CBC within normal limits and completed in the last 12 months    WBC  Date Value Ref Range Status  07/16/2021 5.9 4.0 - 10.5 K/uL Final   RBC  Date Value Ref Range Status  07/16/2021 4.13 3.87 - 5.11 MIL/uL Final   Hemoglobin  Date Value Ref Range Status  07/16/2021 11.2 (L) 12.0 - 15.0 g/dL Final  11/25/2019 9.0 (L) 11.1 - 15.9 g/dL Final   HCT  Date Value Ref Range Status  07/16/2021 35.3 (L) 36.0 - 46.0 % Final   Hematocrit  Date Value Ref Range Status  11/25/2019 28.2 (L) 34.0 - 46.6 % Final   MCHC  Date Value Ref Range Status  07/16/2021 31.7 30.0 - 36.0 g/dL Final   [place]  Date Value Ref Range Status  07/16/2021 27.1 26.0 - 34.0 pg Final   MCV  Date Value Ref Range Status  07/16/2021 85.5 80.0 - 100.0 fL Final  11/25/2019 90 79 - 97 fL Final   No results found for: "PLTCOUNTKUC", "LABPLAT", "POCPLA" RDW  Date Value Ref Range Status  07/16/2021 15.4 11.5 - 15.5 % Final  11/25/2019 15.4 11.7 - 15.4 % Final         . escitalopram (LEXAPRO) 10 MG tablet [Pharmacy Med Name: ESCITALOPRAM OXALATE 10 MG ORAL TABLET] 30 tablet 0    Sig: TAKE 1 TABLET (10 MG TOTAL) BY MOUTH DAILY.     Psychiatry:  Antidepressants - SSRI Passed - 08/12/2021  5:27 PM      Passed - Completed PHQ-2 or PHQ-9 in the last 360 days      Passed - Valid encounter within last 6 months    Recent Outpatient Visits          1 month ago Frequent falls   [place], Enobong, MD   3 months ago Lumbar radiculopathy   [place], Enobong, MD   4 months ago Weakness of both lower extremities   [place], Charlane Ferretti, MD   5 months ago Edema of both legs   [place], Zelda W, NP   6 months ago Hospital discharge follow-up   [place], [place] W, NP             . febuxostat (ULORIC) 40 MG tablet [Pharmacy Med Name: FEBUXOSTAT 40 MG ORAL TABLET] 30 tablet     Sig: TAKE 1 TABLET (40 MG TOTAL) BY MOUTH DAILY.     Endocrinology: Gout Agents - febuxostat Passed - 08/12/2021  5:27 PM      Passed - Uric Acid in normal range and within 360 days    Uric Acid  Date Value Ref Range Status  01/19/2021 6.3 3.0 - 7.2 mg/dL Final    Comment:               Therapeutic target for gout patients: <6.0         Passed - Cr in normal range and within 360 days  Creat  Date Value Ref Range Status  02/18/2016 1.22 (H) 0.50 - 1.05 mg/dL Final    Comment:      For patients > or = 62 years of age: The upper reference limit for Creatinine is approximately 13% higher for people identified as African-American.      Creatinine, Ser  Date Value Ref Range Status  07/16/2021 1.00 0.44 - 1.00 mg/dL Final   Creatinine,U  Date Value Ref Range Status  06/17/2012 521.94 mg/dL Final    Comment:    Result confirmed by automatic dilution.    Cutoff Values for Urine Drug Screen, Pain Mgmt          Drug Class           Cutoff (ng/mL)          Amphetamines             500          Barbiturates             200          Cocaine Metabolites      150          Benzodiazepines          200          Methadone                300          Opiates                  300          Phencyclidine             25          Propoxyphene             300          Marijuana Metabolites     50    For medical purposes only.    Creatinine, Urine  Date Value Ref Range Status  02/19/2020 300.84 mg/dL Final    Comment:    Performed at [facility], [place] [address] [place], [place] [address]         Passed - AST in normal range and within 360 days    AST  Date Value Ref Range Status  07/09/2021 26 15 - 41 U/L Final         Passed - ALT in normal range and within 360 days    ALT  Date Value Ref Range Status  07/09/2021 17 0 - 44 U/L Final         Passed - Valid encounter within last 12 months    Recent Outpatient Visits          1 month ago Frequent falls   [place], Enobong, MD   3 months ago Lumbar radiculopathy   [place], Enobong, MD   4 months ago Weakness of both lower extremities   [place], Enobong, MD   5 months ago Edema of both legs   [place], Zelda W, NP   6 months ago Hospital discharge follow-up   [place] Gildardo Pounds, NP

## 2021-08-15 NOTE — Telephone Encounter (Signed)
Requested medications are due for refill today.  unsure  Requested medications are on the active medications list.  Neither are on active med list  Last refill. Lexapro 12/16/2020 #30 0 refills, Uloric 05/20/2021 unknown quantity  Future visit scheduled.   no  Notes to clinic.  Lexapro was discontinued 12/26/2020. Uloric was refilled 05/20/2021 by  a historical provider.    Requested Prescriptions  Pending Prescriptions Disp Refills   escitalopram (LEXAPRO) 10 MG tablet [Pharmacy Med Name: ESCITALOPRAM OXALATE 10 MG ORAL TABLET] 30 tablet 0    Sig: TAKE 1 TABLET (10 MG TOTAL) BY MOUTH DAILY.     Psychiatry:  Antidepressants - SSRI Passed - 08/12/2021  5:27 PM      Passed - Completed PHQ-2 or PHQ-9 in the last 360 days      Passed - Valid encounter within last 6 months    Recent Outpatient Visits           1 month ago Frequent falls   [place], Enobong, MD   3 months ago Lumbar radiculopathy   [place], Enobong, MD   4 months ago Weakness of both lower extremities   [place], Charlane Ferretti, MD   5 months ago Edema of both legs   [place], Zelda W, NP   6 months ago Hospital discharge follow-up   [place], NP               febuxostat (ULORIC) 40 MG tablet [Pharmacy Med Name: FEBUXOSTAT 40 MG ORAL TABLET] 30 tablet     Sig: TAKE 1 TABLET (40 MG TOTAL) BY MOUTH DAILY.     Endocrinology: Gout Agents - febuxostat Passed - 08/12/2021  5:27 PM      Passed - Uric Acid in normal range and within 360 days    Uric Acid  Date Value Ref Range Status  01/19/2021 6.3 3.0 - 7.2 mg/dL Final    Comment:               Therapeutic target for gout patients: <6.0         Passed - Cr in normal range and within 360 days    Creat  Date Value Ref Range Status  02/18/2016 1.22 (H) 0.50 - 1.05  mg/dL Final    Comment:      For patients > or = 62 years of age: The upper reference limit for Creatinine is approximately 13% higher for people identified as African-American.      Creatinine, Ser  Date Value Ref Range Status  07/16/2021 1.00 0.44 - 1.00 mg/dL Final   Creatinine,U  Date Value Ref Range Status  06/17/2012 521.94 mg/dL Final    Comment:    Result confirmed by automatic dilution.    Cutoff Values for Urine Drug Screen, Pain Mgmt          Drug Class           Cutoff (ng/mL)          Amphetamines             500          Barbiturates             200          Cocaine Metabolites      150  Benzodiazepines          200          Methadone                300          Opiates                  300          Phencyclidine             25          Propoxyphene             300          Marijuana Metabolites     50    For medical purposes only.   Creatinine, Urine  Date Value Ref Range Status  02/19/2020 300.84 mg/dL Final    Comment:    Performed at [facility], [place] [address] [place], [place] [address]         Passed - AST in normal range and within 360 days    AST  Date Value Ref Range Status  07/09/2021 26 15 - 41 U/L Final         Passed - ALT in normal range and within 360 days    ALT  Date Value Ref Range Status  07/09/2021 17 0 - 44 U/L Final         Passed - Valid encounter within last 12 months    Recent Outpatient Visits           1 month ago Frequent falls   [place], Charlane Ferretti, MD   3 months ago Lumbar radiculopathy   [place], Enobong, MD   4 months ago Weakness of both lower extremities   [place], Charlane Ferretti, MD   5 months ago Edema of both legs   [place], [place] W, NP   6 months ago Hospital discharge follow-up   [place], Vernia Buff, NP              Signed Prescriptions Disp Refills   colchicine 0.6 MG tablet 30 tablet 1    Sig: TAKE 2 TABS (1.2 MG) ORALLY AT THE ONSET OF GOUT FLARE, MAY REPEAT 1 TAB (0.6 MG) 1 HOUR IF SYMPTOMS PERSIST     Endocrinology:  Gout Agents - colchicine Passed - 08/12/2021  5:27 PM      Passed - Cr in normal range and within 360 days    Creat  Date Value Ref Range Status  02/18/2016 1.22 (H) 0.50 - 1.05 mg/dL Final    Comment:      For patients > or = 62 years of age: The upper reference limit for Creatinine is approximately 13% higher for people identified as African-American.      Creatinine, Ser  Date Value Ref Range Status  07/16/2021 1.00 0.44 - 1.00 mg/dL Final   Creatinine,U  Date Value Ref Range Status  06/17/2012 521.94 mg/dL Final    Comment:    Result confirmed by automatic dilution.    Cutoff Values for Urine Drug Screen, Pain Mgmt          Drug Class           Cutoff (ng/mL)          Amphetamines  500          Barbiturates             200          Cocaine Metabolites      150          Benzodiazepines          200          Methadone                300          Opiates                  300          Phencyclidine             25          Propoxyphene             300          Marijuana Metabolites     50    For medical purposes only.   Creatinine, Urine  Date Value Ref Range Status  02/19/2020 300.84 mg/dL Final    Comment:    Performed at [facility], [place] [address] [place], [place] [address]         Passed - ALT in normal range and within 360 days    ALT  Date Value Ref Range Status  07/09/2021 17 0 - 44 U/L Final         Passed - AST in normal range and within 360 days    AST  Date Value Ref Range Status  07/09/2021 26 15 - 41 U/L Final         Passed - Valid encounter within last 12 months    Recent Outpatient Visits           1 month ago Frequent falls   [place], [place], MD   3 months ago Lumbar radiculopathy   [place], Charlane Ferretti, MD   4 months ago Weakness of both lower extremities   [place], Charlane Ferretti, MD   5 months ago Edema of both legs   [place], Vernia Buff, NP   6 months ago Hospital discharge follow-up   [place], Vernia Buff, NP              Passed - CBC within normal limits and completed in the last 12 months    WBC  Date Value Ref Range Status  07/16/2021 5.9 4.0 - 10.5 K/uL Final   RBC  Date Value Ref Range Status  07/16/2021 4.13 3.87 - 5.11 MIL/uL Final   Hemoglobin  Date Value Ref Range Status  07/16/2021 11.2 (L) 12.0 - 15.0 g/dL Final  11/25/2019 9.0 (L) 11.1 - 15.9 g/dL Final   HCT  Date Value Ref Range Status  07/16/2021 35.3 (L) 36.0 - 46.0 % Final   Hematocrit  Date Value Ref Range Status  11/25/2019 28.2 (L) 34.0 - 46.6 % Final   MCHC  Date Value Ref Range Status  07/16/2021 31.7 30.0 - 36.0 g/dL Final   [facility]  Date Value Ref Range Status  07/16/2021 27.1 26.0 - 34.0 pg Final   MCV  Date Value Ref Range Status  07/16/2021 85.5 80.0 - 100.0 fL Final  11/25/2019 90 79 -  97 fL Final   No results found for: "PLTCOUNTKUC", "LABPLAT", "POCPLA" RDW  Date Value Ref Range Status  07/16/2021 15.4 11.5 - 15.5 % Final  11/25/2019 15.4 11.7 - 15.4 % Final

## 2021-08-16 NOTE — Telephone Encounter (Signed)
Patient checking on the status of her pregabalin (LYRICA) 75 MG capsule  refill request and would like request expedited -   [place], [place] Phone:  [phone]  Fax:  [phone]

## 2021-08-16 NOTE — Telephone Encounter (Signed)
Pls see message from Dr. Margarita Rana in separate encounter pertaining to same request.

## 2021-08-17 ENCOUNTER — Other Ambulatory Visit: Payer: Self-pay | Admitting: Family Medicine

## 2021-08-17 DIAGNOSIS — I10 Essential (primary) hypertension: Secondary | ICD-10-CM

## 2021-08-17 DIAGNOSIS — R600 Localized edema: Secondary | ICD-10-CM

## 2021-08-17 NOTE — Telephone Encounter (Signed)
Copied from [employer]. Topic: General - Other >> Aug 17, 2021  1:39 PM Everette C wrote: Reason for CRM: Medication Refill - Medication: pregabalin (LYRICA) 75 MG capsule [[id number]]   Has the patient contacted their pharmacy? Yes.  The patient has been in contact with their pharmacy and been told to contact their PCP  (Agent: If no, request that the patient contact the pharmacy for the refill. If patient does not wish to contact the pharmacy document the reason why and proceed with request.) (Agent: If yes, when and what did the pharmacy advise?)  Preferred Pharmacy (with phone number or street name): [place], [place] [address] Phone: [phone] Fax: [phone] Hours: Not open 24 hours   Has the patient been seen for an appointment in the last year OR does the patient have an upcoming appointment? Yes.    Agent: Please be advised that RX refills may take up to 3 business days. We ask that you follow-up with your pharmacy.

## 2021-08-17 NOTE — Telephone Encounter (Signed)
Spoke to patient. Name and DOB verified.  Advised to call office of Dr. Ayesha Rumpf and informed that referral was sent to [facility].   She states she did not have time to take the number [phone] down.  States she will call office tomorrow to get number.

## 2021-08-17 NOTE — Telephone Encounter (Signed)
Requested medication (s) are due for refill today - yes  Requested medication (s) are on the active medication list -yes  Future visit scheduled -no  Last refill: 07/18/21 #60  Notes to clinic: non delegated Rx, outside provider  Requested Prescriptions  Pending Prescriptions Disp Refills   pregabalin (LYRICA) 75 MG capsule 60 capsule 0    Sig: Take 1 capsule (75 mg total) by mouth 2 (two) times daily.     Not Delegated - Neurology:  Anticonvulsants - Controlled - pregabalin Failed - 08/17/2021  2:09 PM      Failed - This refill cannot be delegated      Passed - Cr in normal range and within 360 days    Creat  Date Value Ref Range Status  02/18/2016 1.22 (H) 0.50 - 1.05 mg/dL Final    Comment:      For patients > or = 62 years of age: The upper reference limit for Creatinine is approximately 13% higher for people identified as African-American.      Creatinine, Ser  Date Value Ref Range Status  07/16/2021 1.00 0.44 - 1.00 mg/dL Final   Creatinine,U  Date Value Ref Range Status  06/17/2012 521.94 mg/dL Final    Comment:    Result confirmed by automatic dilution.    Cutoff Values for Urine Drug Screen, Pain Mgmt          Drug Class           Cutoff (ng/mL)          Amphetamines             500          Barbiturates             200          Cocaine Metabolites      150          Benzodiazepines          200          Methadone                300          Opiates                  300          Phencyclidine             25          Propoxyphene             300          Marijuana Metabolites     50    For medical purposes only.   Creatinine, Urine  Date Value Ref Range Status  02/19/2020 300.84 mg/dL Final    Comment:    Performed at [facility], [place] [address] [place], [place] [address]         Passed - Completed PHQ-2 or PHQ-9 in the last 360 days      Passed - Valid encounter within last 12 months    Recent Outpatient Visits           1  month ago Frequent falls   [place], Enobong, MD   4 months ago Lumbar radiculopathy   [place], Enobong, MD   4 months ago Weakness of both lower extremities   [place] [facility] [name], MD  5 months ago Edema of both legs   [place], [place] W, NP   7 months ago Hospital discharge follow-up   [place], Vernia Buff, NP                 Requested Prescriptions  Pending Prescriptions Disp Refills   pregabalin (LYRICA) 75 MG capsule 60 capsule 0    Sig: Take 1 capsule (75 mg total) by mouth 2 (two) times daily.     Not Delegated - Neurology:  Anticonvulsants - Controlled - pregabalin Failed - 08/17/2021  2:09 PM      Failed - This refill cannot be delegated      Passed - Cr in normal range and within 360 days    Creat  Date Value Ref Range Status  02/18/2016 1.22 (H) 0.50 - 1.05 mg/dL Final    Comment:      For patients > or = 62 years of age: The upper reference limit for Creatinine is approximately 13% higher for people identified as African-American.      Creatinine, Ser  Date Value Ref Range Status  07/16/2021 1.00 0.44 - 1.00 mg/dL Final   Creatinine,U  Date Value Ref Range Status  06/17/2012 521.94 mg/dL Final    Comment:    Result confirmed by automatic dilution.    Cutoff Values for Urine Drug Screen, Pain Mgmt          Drug Class           Cutoff (ng/mL)          Amphetamines             500          Barbiturates             200          Cocaine Metabolites      150          Benzodiazepines          200          Methadone                300          Opiates                  300          Phencyclidine             25          Propoxyphene             300          Marijuana Metabolites     50    For medical purposes only.   Creatinine, Urine  Date Value Ref Range  Status  02/19/2020 300.84 mg/dL Final    Comment:    Performed at [facility], [place] [address], [place], [place] [address]         Passed - Completed PHQ-2 or PHQ-9 in the last 360 days      Passed - Valid encounter within last 12 months    Recent Outpatient Visits           1 month ago Frequent falls   [place], Enobong, MD   4 months ago Lumbar radiculopathy   [place], Charlane Ferretti, MD   4 months ago Weakness of both  lower extremities   [place], Charlane Ferretti, MD   5 months ago Edema of both legs   [place], Zelda W, NP   7 months ago Hospital discharge follow-up   [place] Gildardo Pounds, NP

## 2021-08-26 ENCOUNTER — Other Ambulatory Visit: Payer: Self-pay | Admitting: Family Medicine

## 2021-08-26 DIAGNOSIS — J42 Unspecified chronic bronchitis: Secondary | ICD-10-CM

## 2021-08-26 NOTE — Telephone Encounter (Signed)
Requested medication (s) are due for refill today: yes  Requested medication (s) are on the active medication list: yes  Last refill:  01/14/21 #30/6  Future visit scheduled: no  Notes to clinic:  rx was prescribed at ED visit. Please advise for refill      Requested Prescriptions  Pending Prescriptions Disp Refills   montelukast (SINGULAIR) 10 MG tablet [Pharmacy Med Name: MONTELUKAST SODIUM 10 MG ORAL TABLET] 30 tablet 6    Sig: Take 1 tablet (10 mg total) by mouth at bedtime.     Pulmonology:  Leukotriene Inhibitors Passed - 08/26/2021  5:18 PM      Passed - Valid encounter within last 12 months    Recent Outpatient Visits           1 month ago Frequent falls   [place], Enobong, MD   4 months ago Lumbar radiculopathy   [place], Enobong, MD   5 months ago Weakness of both lower extremities   [place], Enobong, MD   6 months ago Edema of both legs   [place], Zelda W, NP   7 months ago Hospital discharge follow-up   [place] Gildardo Pounds, NP

## 2021-08-26 NOTE — Telephone Encounter (Signed)
Requested Prescriptions  Pending Prescriptions Disp Refills  . albuterol (VENTOLIN HFA) 108 (90 Base) MCG/ACT inhaler [Pharmacy Med Name: ALBUTEROL SULFATE HFA 108 (90 BASE) MCG/ACT INHALATION AEROSOL SOLUTION] 8.5 g 2    Sig: INHALE 2 PUFFS INTO THE LUNGS EVERY 6 (SIX) HOURS AS NEEDED FOR WHEEZING OR SHORTNESS OF BREATH.     Pulmonology:  Beta Agonists 2 Passed - 08/26/2021  5:15 PM      Passed - Last BP in normal range    BP Readings from Last 1 Encounters:  07/18/21 120/82         Passed - Last Heart Rate in normal range    Pulse Readings from Last 1 Encounters:  07/18/21 94         Passed - Valid encounter within last 12 months    Recent Outpatient Visits          1 month ago Frequent falls   [place], Charlane Ferretti, MD   4 months ago Lumbar radiculopathy   [place], Enobong, MD   5 months ago Weakness of both lower extremities   [place], Charlane Ferretti, MD   6 months ago Edema of both legs   [place], Zelda W, NP   7 months ago Hospital discharge follow-up   [place], NP             . amLODipine (NORVASC) 5 MG tablet [Pharmacy Med Name: AMLODIPINE BESYLATE 5 MG ORAL TABLET] 30 tablet 6    Sig: TAKE 1 TABLET (5 MG TOTAL) BY MOUTH DAILY.     Cardiovascular: Calcium Channel Blockers 2 Passed - 08/26/2021  5:15 PM      Passed - Last BP in normal range    BP Readings from Last 1 Encounters:  07/18/21 120/82         Passed - Last Heart Rate in normal range    Pulse Readings from Last 1 Encounters:  07/18/21 94         Passed - Valid encounter within last 6 months    Recent Outpatient Visits          1 month ago Frequent falls   [place], Charlane Ferretti, MD   4 months ago Lumbar radiculopathy   [place], Enobong, MD   5 months ago Weakness of both lower extremities   [place], Enobong, MD   6 months ago Edema of both legs   [place], Zelda W, NP   7 months ago Hospital discharge follow-up   [place] Gildardo Pounds, NP

## 2021-08-30 ENCOUNTER — Other Ambulatory Visit: Payer: Self-pay | Admitting: Family Medicine

## 2021-08-30 NOTE — Telephone Encounter (Signed)
Requested medication (s) are due for refill today: historical medication  Requested medication (s) are on the active medication list: yes  Last refill:  05/07/21  Future visit scheduled: no  Notes to clinic:  historical medication. Do you want to order Rx?     Requested Prescriptions  Pending Prescriptions Disp Refills   febuxostat (ULORIC) 40 MG tablet [Pharmacy Med Name: FEBUXOSTAT 40 MG ORAL TABLET] 30 tablet     Sig: TAKE 1 TABLET (40 MG TOTAL) BY MOUTH DAILY.     Endocrinology: Gout Agents - febuxostat Passed - 08/30/2021  4:28 PM      Passed - Uric Acid in normal range and within 360 days    Uric Acid  Date Value Ref Range Status  01/19/2021 6.3 3.0 - 7.2 mg/dL Final    Comment:               Therapeutic target for gout patients: <6.0         Passed - Cr in normal range and within 360 days    Creat  Date Value Ref Range Status  02/18/2016 1.22 (H) 0.50 - 1.05 mg/dL Final    Comment:      For patients > or = 62 years of age: The upper reference limit for Creatinine is approximately 13% higher for people identified as African-American.      Creatinine, Ser  Date Value Ref Range Status  07/16/2021 1.00 0.44 - 1.00 mg/dL Final   Creatinine,U  Date Value Ref Range Status  06/17/2012 521.94 mg/dL Final    Comment:    Result confirmed by automatic dilution.    Cutoff Values for Urine Drug Screen, Pain Mgmt          Drug Class           Cutoff (ng/mL)          Amphetamines             500          Barbiturates             200          Cocaine Metabolites      150          Benzodiazepines          200          Methadone                300          Opiates                  300          Phencyclidine             25          Propoxyphene             300          Marijuana Metabolites     50    For medical purposes only.   Creatinine, Urine  Date Value Ref Range Status  02/19/2020 300.84 mg/dL Final    Comment:    Performed at [facility],  [place] [address], [place], [place] [address]         Passed - AST in normal range and within 360 days    AST  Date Value Ref Range Status  07/09/2021 26 15 - 41 U/L Final         Passed - ALT  in normal range and within 360 days    ALT  Date Value Ref Range Status  07/09/2021 17 0 - 44 U/L Final         Passed - Valid encounter within last 12 months    Recent Outpatient Visits           2 months ago Frequent falls   [place], Enobong, MD   4 months ago Lumbar radiculopathy   [place], Enobong, MD   5 months ago Weakness of both lower extremities   Dendron, Enobong, MD   6 months ago Edema of both legs   Marion, Zelda W, NP   7 months ago Hospital discharge follow-up   Girard Gildardo Pounds, NP

## 2021-09-02 ENCOUNTER — Encounter (HOSPITAL_COMMUNITY): Payer: Self-pay

## 2021-09-02 ENCOUNTER — Other Ambulatory Visit: Payer: Self-pay

## 2021-09-02 ENCOUNTER — Emergency Department (HOSPITAL_COMMUNITY)
Admission: EM | Admit: 2021-09-02 | Discharge: 2021-09-03 | Disposition: A | Discharge status: Home / Self Care | Payer: Medicare HMO | Attending: Emergency Medicine | Admitting: Emergency Medicine

## 2021-09-02 DIAGNOSIS — E876 Hypokalemia: Secondary | ICD-10-CM | POA: Diagnosis not present

## 2021-09-02 DIAGNOSIS — R110 Nausea: Secondary | ICD-10-CM

## 2021-09-02 DIAGNOSIS — R112 Nausea with vomiting, unspecified: Secondary | ICD-10-CM | POA: Diagnosis present

## 2021-09-02 LAB — COMPREHENSIVE METABOLIC PANEL
ALT: 17 U/L (ref 0–44)
AST: 26 U/L (ref 15–41)
Albumin: 3.8 g/dL (ref 3.5–5.0)
Alkaline Phosphatase: 251 U/L — ABNORMAL HIGH (ref 38–126)
Anion gap: 13 (ref 5–15)
BUN: 23 mg/dL (ref 8–23)
CO2: 22 mmol/L (ref 22–32)
Calcium: 9.2 mg/dL (ref 8.9–10.3)
Chloride: 101 mmol/L (ref 98–111)
Creatinine, Ser: 1.82 mg/dL — ABNORMAL HIGH (ref 0.44–1.00)
GFR, Estimated: 31 mL/min — ABNORMAL LOW (ref >60)
Glucose, Bld: 80 mg/dL (ref 70–99)
Potassium: 2.5 mmol/L — CL (ref 3.5–5.1)
Sodium: 136 mmol/L (ref 135–145)
Total Bilirubin: 0.4 mg/dL (ref 0.3–1.2)
Total Protein: 7.7 g/dL (ref 6.5–8.1)

## 2021-09-02 LAB — CBC
HCT: 40.9 % (ref 36.0–46.0)
Hemoglobin: 12.9 g/dL (ref 12.0–15.0)
MCH: 26.8 pg (ref 26.0–34.0)
MCHC: 31.5 g/dL (ref 30.0–36.0)
MCV: 85 fL (ref 80.0–100.0)
Platelets: 334 10*3/uL (ref 150–400)
RBC: 4.81 MIL/uL (ref 3.87–5.11)
RDW: 16 % — ABNORMAL HIGH (ref 11.5–15.5)
WBC: 5.4 10*3/uL (ref 4.0–10.5)
nRBC: 0.4 % — ABNORMAL HIGH (ref 0.0–0.2)

## 2021-09-02 LAB — LIPASE, BLOOD: Lipase: 27 U/L (ref 11–51)

## 2021-09-02 MED ORDER — POTASSIUM CHLORIDE 10 MEQ/100ML IV SOLN
10.0000 meq | INTRAVENOUS | Status: AC
  Administered 2021-09-02 (×2): 10 meq via INTRAVENOUS
  Filled 2021-09-02 (×2): qty 100

## 2021-09-02 MED ORDER — POTASSIUM CHLORIDE CRYS ER 20 MEQ PO TBCR
20.0000 meq | EXTENDED_RELEASE_TABLET | Freq: Once | ORAL | Status: AC
  Administered 2021-09-02: 20 meq via ORAL
  Filled 2021-09-02: qty 1

## 2021-09-02 MED ORDER — POTASSIUM CHLORIDE CRYS ER 20 MEQ PO TBCR
40.0000 meq | EXTENDED_RELEASE_TABLET | Freq: Once | ORAL | Status: AC
  Administered 2021-09-02: 40 meq via ORAL
  Filled 2021-09-02: qty 2

## 2021-09-02 MED ORDER — SODIUM CHLORIDE 0.9 % IV SOLN: Freq: Once | INTRAVENOUS | Status: AC

## 2021-09-02 MED ORDER — SODIUM CHLORIDE 0.9 % IV BOLUS
250.0000 mL | Freq: Once | INTRAVENOUS | Status: AC
  Administered 2021-09-02: 250 mL via INTRAVENOUS

## 2021-09-02 NOTE — ED Provider Notes (Signed)
[facility] DEPT Provider Note   CSN: [id number] Arrival date & time: 09/02/21  1847     History {Add pertinent medical, surgical, social history, OB history to HPI:1} Chief Complaint  Patient presents with  . Nausea    Tiffany [name] is a 61 y.o. female.  HPI     Home Medications Prior to Admission medications   Medication Sig Start Date End Date Taking? Authorizing Provider  albuterol (VENTOLIN HFA) 108 (90 Base) MCG/ACT inhaler INHALE 2 PUFFS INTO THE LUNGS EVERY 6 (SIX) HOURS AS NEEDED FOR WHEEZING OR SHORTNESS OF BREATH. 08/26/21   Newlin, Charlane Ferretti, MD  amLODipine (NORVASC) 5 MG tablet TAKE 1 TABLET (5 MG TOTAL) BY MOUTH DAILY. 08/26/21   [name], MD  Asenapine Maleate 10 MG SUBL Place 1 tablet (10 mg total) under the tongue 2 (two) times daily. 04/06/21   Shelly Coss, MD  atorvastatin (LIPITOR) 20 MG tablet Take 1 tablet (20 mg total) by mouth daily. 06/24/21   [name], MD  carvedilol (COREG) 6.25 MG tablet TAKE 1 TABLET(6.25 MG) BY MOUTH TWICE DAILY WITH A MEAL 08/17/21   Newlin, Charlane Ferretti, MD  colchicine 0.6 MG tablet TAKE 2 TABS (1.2 MG) ORALLY AT THE ONSET OF GOUT FLARE, MAY REPEAT 1 TAB (0.6 MG) 1 HOUR IF SYMPTOMS PERSIST 08/15/21   [name], MD  cyclobenzaprine (FLEXERIL) 10 MG tablet Take 10 mg by mouth 2 (two) times daily as needed. 06/27/21   [provider]  diazepam (VALIUM) 5 MG tablet Take 1 tablet (5 mg total) by mouth daily as needed for anxiety. 07/12/21   Bonnielee Haff, MD  diclofenac Sodium (VOLTAREN) 1 % GEL Apply 4 g topically 4 (four) times daily as needed (pain). Patient taking differently: Apply 1 application  topically 3 (three) times daily. 04/19/21   [name], MD  dicyclomine (BENTYL) 10 MG capsule Take 1 capsule (10 mg total) by mouth 3 (three) times daily before meals. 06/24/21   [name], MD  DULoxetine (CYMBALTA) 30 MG capsule Take 1 capsule (30 mg total) by mouth every morning.  08/08/21   [name], MD  febuxostat (ULORIC) 40 MG tablet TAKE 1 TABLET (40 MG TOTAL) BY MOUTH DAILY. 08/31/21   [name], MD  Fluticasone-Umeclidin-Vilant (TRELEGY ELLIPTA) 100-62.5-25 MCG/ACT AEPB Inhale 1 puff into the lungs daily. 06/24/21   [name], MD  Iron, Ferrous Sulfate, 325 (65 Fe) MG TABS Take 325 mg by mouth daily. 06/24/21   [name], MD  loperamide (IMODIUM) 2 MG capsule Take 2 capsules (4 mg total) by mouth 3 (three) times daily as needed for diarrhea or loose stools. 07/12/21   Bonnielee Haff, MD  methocarbamol (ROBAXIN) 500 MG tablet TAKE 1 TABLET(500 MG) BY MOUTH TWICE DAILY 08/01/21   [name], MD  montelukast (SINGULAIR) 10 MG tablet TAKE 1 TABLET (10 MG TOTAL) BY MOUTH AT BEDTIME. 08/29/21   [name], MD  omeprazole (PRILOSEC) 40 MG capsule Take 1 capsule (40 mg total) by mouth every morning. 06/24/21   [name], MD  oxybutynin (DITROPAN) 5 MG tablet Take 1 tablet (5 mg total) by mouth 2 (two) times daily. 06/24/21   [name], MD  potassium chloride SA (KLOR-CON M) 20 MEQ tablet Take 1 tablet (20 mEq total) by mouth daily. 06/24/21 07/24/21  [name], MD  prazosin (MINIPRESS) 2 MG capsule Take 1 capsule (2 mg total) by mouth at bedtime. TAKE 1 CAPSULE(2 MG) BY MOUTH AT BEDTIME Strength: 2 mg Patient taking  differently: Take 2 mg by mouth at bedtime. 06/24/21   [name], MD  pregabalin (LYRICA) 75 MG capsule Take 1 capsule (75 mg total) by mouth 2 (two) times daily. 07/18/21 08/17/21  Delfin Gant, NP  terbinafine (LAMISIL AT) 1 % cream Apply 1 application  topically 2 (two) times daily. 06/24/21   [name], MD  torsemide (DEMADEX) 20 MG tablet TAKE 1 TABLET(20 MG) BY MOUTH DAILY 08/17/21   [name], MD      Allergies    Hydrocodone, Latuda [lurasidone hcl], Magnesium-containing compounds, Prednisone, Tramadol, Codeine, Trazodone, Topamax [topiramate], Sulfa antibiotics, and Tape    Review of Systems   Review  of Systems  Physical Exam Updated Vital Signs BP (!) 132/92 (BP Location: Right Arm)   Pulse 84   Temp 98 F (36.7 C) (Oral)   Resp 16   Ht '5\' 4"'$  (1.626 m)   SpO2 98%   BMI 28.32 kg/m  Physical Exam  ED Results / Procedures / Treatments   Labs (all labs ordered are listed, but only abnormal results are displayed) Labs Reviewed  CBC - Abnormal; Notable for the following components:      Result Value   RDW 16.0 (*)    nRBC 0.4 (*)    All other components within normal limits  LIPASE, BLOOD  COMPREHENSIVE METABOLIC PANEL  URINALYSIS, ROUTINE W REFLEX MICROSCOPIC    EKG None  Radiology No results found.  Procedures Procedures  {Document cardiac monitor, telemetry assessment procedure when appropriate:1}  Medications Ordered in ED Medications - No data to display  ED Course/ Medical Decision Making/ A&P                           Medical Decision Making Amount and/or Complexity of Data Reviewed Labs: ordered.   ***  {Document critical care time when appropriate:1} {Document review of labs and clinical decision tools ie heart score, Chads2Vasc2 etc:1}  {Document your independent review of radiology images, and any outside records:1} {Document your discussion with family members, caretakers, and with consultants:1} {Document social determinants of health affecting pt's care:1} {Document your decision making why or why not admission, treatments were needed:1} Final Clinical Impression(s) / ED Diagnoses Final diagnoses:  None    Rx / DC Orders ED Discharge Orders     None

## 2021-09-02 NOTE — ED Notes (Signed)
Pt potassium 2.5 MD notified

## 2021-09-02 NOTE — ED Triage Notes (Signed)
PER EMS: pt is from home with c/o n/v/d x 8 days. She also reports chronic leg pain. [name] bed bound. A&OX4.  BP- 130/100, HR-96, RR-18, 98% RA

## 2021-09-03 MED ORDER — ONDANSETRON 4 MG PO TBDP: 4.0000 mg | ORAL_TABLET | ORAL | 0 refills | Status: DC | PRN

## 2021-09-03 MED ORDER — POTASSIUM CHLORIDE CRYS ER 20 MEQ PO TBCR: 20.0000 meq | EXTENDED_RELEASE_TABLET | Freq: Two times a day (BID) | ORAL | 0 refills | Status: DC

## 2021-09-03 NOTE — ED Notes (Signed)
[facility]AR has been called at 0013.

## 2021-09-03 NOTE — Discharge Instructions (Addendum)
1.  Take a potassium tablet twice daily for the next 6 days.  Schedule follow-up with your doctor within the next 3 to 5 days. 2.  Take Zofran if needed for nausea.  Continue to sip fluids frequently.

## 2021-09-03 NOTE — ED Notes (Signed)
PTAR at bedside 

## 2021-09-07 ENCOUNTER — Telehealth: Payer: Self-pay | Admitting: *Deleted

## 2021-09-07 NOTE — Telephone Encounter (Signed)
     Patient  visit on [place]   at 09/01/2021 was for nausea  Have you been able to follow up with your primary care physician? y  The patient was able to obtain any needed medicine or equipment.  Are there diet recommendations that you are having difficulty following?  Patient expresses understanding of discharge instructions and education provided has no other needs at this time.    [facility] [phone] [address] , [place] [address] Email : [email or web address]

## 2021-09-14 ENCOUNTER — Encounter: Payer: Self-pay | Admitting: *Deleted

## 2021-09-14 ENCOUNTER — Other Ambulatory Visit: Payer: Self-pay | Admitting: *Deleted

## 2021-09-14 NOTE — Patient Outreach (Signed)
  Care Coordination   Initial Visit Note   09/14/2021 Name: Tiffany [name] MRN: [id number] DOB: 04/07/1960  Tiffany [name] is a 61 y.o. year old female who sees Lin Landsman, MD for primary care. I spoke with  [name] by phone today.  What matters to the patients health and wellness today?  Patient state she was back in the hospital, now discharged to rehab center in [facility]  Advised to reassignment of care manager, will provide update to newly assigned care manager once she is discharged home.     Goals Addressed               This Visit's Progress     Improve in rehab and be discharged home (pt-stated)        Care Coordination Interventions: Evaluation of current treatment plan related to overall improved mental health and increased leg strength and patient's adherence to plan as established by provider Advised patient to call RNCM once she is back home         SDOH assessments and interventions completed:  Yes  SDOH Interventions Today    Flowsheet Row Most Recent Value  SDOH Interventions   Food Insecurity Interventions Intervention Not Indicated  Housing Interventions Intervention Not Indicated  Transportation Interventions SCAT (Specialized Community Area [employer]ation)        Care Coordination Interventions Activated:  Yes  Care Coordination Interventions:  Yes, provided   Follow up plan:  Patient to call once she is discharged home.    Encounter Outcome:  Pt. Visit Completed   [name], RN, MSN, [facility] Care Management Care Management Coordinator [phone]

## 2021-09-14 NOTE — Patient Instructions (Signed)
Visit Information  Thank you for taking time to visit with me today. Please don't hesitate to contact me if I can be of assistance to you.  Please call the care guide team at [phone] if you need to cancel or reschedule your appointment.   Please call the Suicide and Crisis Lifeline: 988 call the [place] National Suicide Prevention Lifeline: 319-328-0373 or TTY: (346) 389-4462 TTY [phone]) to talk to a trained counselor go to [facility] [address], [place] [phone]) call 911 if you are experiencing a Mental Health or [place] or need someone to talk to.  Patient verbalizes understanding of instructions and care plan provided today and agrees to view in [place]. Active MyChart status and patient understanding of how to access instructions and care plan via MyChart confirmed with patient.     The patient has been provided with contact information for the care management team and has been advised to call with any health related questions or concerns.   [name], RN, MSN, [facility] Care Management Care Management Coordinator [phone]

## 2021-09-16 ENCOUNTER — Other Ambulatory Visit: Payer: Self-pay | Admitting: *Deleted

## 2021-09-16 NOTE — Patient Outreach (Signed)
  Care Coordination   Follow Up Visit Note   09/16/2021 Name: Tiffany [name] MRN: [id number] DOB: Apr 03, 1960  Tiffany [name] is a 61 y.o. year old female who sees Lin Landsman, MD for primary care. I spoke with  [name] by phone today.  What matters to the patients health and wellness today?  Patient continues to be impulsive and non compliant.  She has left rehab AMA and now asking for [facility] to send a nurse to the home to care for her.  Advised this is not an option, encouraged her to call PCP office to request order for home health nursing, PT, and OT.  Provided with office's contact information.      Goals Addressed               This Visit's Progress     COMPLETED: Improve in rehab and be discharged home (pt-stated)   Not on track     Care Coordination Interventions: Evaluation of current treatment plan related to overall improved mental health and increased leg strength and patient's adherence to plan as established by provider Advised patient to call RNCM once she is back home         SDOH assessments and interventions completed:  No     Care Coordination Interventions Activated:  Yes  Care Coordination Interventions:  Yes, provided   Follow up plan: No further intervention required. Patient does not adhere to plan of care, non-compliant and has refused professional recommendations consistently.    Encounter Outcome:  Pt. Visit Completed   [name], RN, MSN, [facility] Care Management Care Management Coordinator [phone]

## 2021-09-17 ENCOUNTER — Encounter (HOSPITAL_BASED_OUTPATIENT_CLINIC_OR_DEPARTMENT_OTHER): Payer: Self-pay

## 2021-09-17 ENCOUNTER — Emergency Department (HOSPITAL_BASED_OUTPATIENT_CLINIC_OR_DEPARTMENT_OTHER)
Admission: EM | Admit: 2021-09-17 | Discharge: 2021-09-17 | Disposition: A | Discharge status: Home / Self Care | Payer: Medicare HMO | Attending: Emergency Medicine | Admitting: Emergency Medicine

## 2021-09-17 ENCOUNTER — Other Ambulatory Visit: Payer: Self-pay

## 2021-09-17 DIAGNOSIS — K644 Residual hemorrhoidal skin tags: Secondary | ICD-10-CM | POA: Diagnosis not present

## 2021-09-17 DIAGNOSIS — K921 Melena: Secondary | ICD-10-CM

## 2021-09-17 DIAGNOSIS — K625 Hemorrhage of anus and rectum: Secondary | ICD-10-CM | POA: Diagnosis present
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
## 2021-09-17 NOTE — ED Triage Notes (Signed)
Pt BIB [facility] from home for rectal bleeding since her first "poop" today. Denies N/V or abd pain. One episode of diarrhea. Denies dizziness. Pt also reports she is unable to use her legs.  Pt with paranoid behavior toward people coming into her room.   BP 138/90 HR 86 98% RA CBG 109

## 2021-09-17 NOTE — ED Notes (Signed)
Tiffany [name], pt's at home nurse aide is at bedside
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
## 2021-09-17 NOTE — ED Notes (Signed)
EDP Triffan in for his initial assessment approx 1220pm. This RN and Research scientist (medical) were present for rectal exam.  EDP explained procedure before actual exam, pt verbalized she understood what exam EDP was going to do, gave verbal permission for exam.
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
## 2021-09-19 ENCOUNTER — Emergency Department
Admission: EM | Admit: 2021-09-19 | Discharge: 2021-09-21 | Disposition: A | Discharge status: Home / Self Care | Payer: Medicare HMO | Attending: Emergency Medicine | Admitting: Emergency Medicine

## 2021-09-19 ENCOUNTER — Emergency Department: Payer: Medicare HMO

## 2021-09-19 ENCOUNTER — Other Ambulatory Visit: Payer: Self-pay

## 2021-09-19 DIAGNOSIS — F1910 Other psychoactive substance abuse, uncomplicated: Secondary | ICD-10-CM | POA: Insufficient documentation

## 2021-09-19 DIAGNOSIS — Z87891 Personal history of nicotine dependence: Secondary | ICD-10-CM | POA: Insufficient documentation

## 2021-09-19 DIAGNOSIS — Z20822 Contact with and (suspected) exposure to covid-19: Secondary | ICD-10-CM | POA: Insufficient documentation

## 2021-09-19 DIAGNOSIS — F418 Other specified anxiety disorders: Secondary | ICD-10-CM | POA: Diagnosis not present

## 2021-09-19 DIAGNOSIS — F250 Schizoaffective disorder, bipolar type: Secondary | ICD-10-CM | POA: Insufficient documentation

## 2021-09-19 DIAGNOSIS — F4310 Post-traumatic stress disorder, unspecified: Secondary | ICD-10-CM | POA: Diagnosis not present

## 2021-09-19 DIAGNOSIS — R569 Unspecified convulsions: Secondary | ICD-10-CM | POA: Insufficient documentation

## 2021-09-19 DIAGNOSIS — A6009 Herpesviral infection of other urogenital tract: Secondary | ICD-10-CM | POA: Diagnosis not present

## 2021-09-19 DIAGNOSIS — N1832 Chronic kidney disease, stage 3b: Secondary | ICD-10-CM | POA: Insufficient documentation

## 2021-09-19 DIAGNOSIS — D500 Iron deficiency anemia secondary to blood loss (chronic): Secondary | ICD-10-CM | POA: Diagnosis present

## 2021-09-19 DIAGNOSIS — F32A Depression, unspecified: Secondary | ICD-10-CM | POA: Diagnosis present

## 2021-09-19 DIAGNOSIS — R45851 Suicidal ideations: Secondary | ICD-10-CM | POA: Insufficient documentation

## 2021-09-19 DIAGNOSIS — R45850 Homicidal ideations: Secondary | ICD-10-CM | POA: Insufficient documentation

## 2021-09-19 DIAGNOSIS — G931 Anoxic brain damage, not elsewhere classified: Secondary | ICD-10-CM | POA: Diagnosis not present

## 2021-09-19 DIAGNOSIS — N3281 Overactive bladder: Secondary | ICD-10-CM | POA: Diagnosis present

## 2021-09-19 DIAGNOSIS — F5081 Binge eating disorder: Secondary | ICD-10-CM | POA: Insufficient documentation

## 2021-09-19 DIAGNOSIS — E119 Type 2 diabetes mellitus without complications: Secondary | ICD-10-CM | POA: Diagnosis not present

## 2021-09-19 DIAGNOSIS — J9611 Chronic respiratory failure with hypoxia: Secondary | ICD-10-CM | POA: Insufficient documentation

## 2021-09-19 DIAGNOSIS — R296 Repeated falls: Secondary | ICD-10-CM

## 2021-09-19 DIAGNOSIS — F419 Anxiety disorder, unspecified: Secondary | ICD-10-CM | POA: Diagnosis present

## 2021-09-19 DIAGNOSIS — J449 Chronic obstructive pulmonary disease, unspecified: Secondary | ICD-10-CM | POA: Insufficient documentation

## 2021-09-19 DIAGNOSIS — F50819 Binge eating disorder, unspecified: Secondary | ICD-10-CM | POA: Diagnosis present

## 2021-09-19 DIAGNOSIS — R4689 Other symptoms and signs involving appearance and behavior: Secondary | ICD-10-CM | POA: Diagnosis present

## 2021-09-19 DIAGNOSIS — A6000 Herpesviral infection of urogenital system, unspecified: Secondary | ICD-10-CM | POA: Diagnosis present

## 2021-09-19 DIAGNOSIS — M159 Polyosteoarthritis, unspecified: Secondary | ICD-10-CM | POA: Diagnosis present

## 2021-09-19 DIAGNOSIS — D649 Anemia, unspecified: Secondary | ICD-10-CM | POA: Insufficient documentation

## 2021-09-19 DIAGNOSIS — I130 Hypertensive heart and chronic kidney disease with heart failure and stage 1 through stage 4 chronic kidney disease, or unspecified chronic kidney disease: Secondary | ICD-10-CM | POA: Diagnosis not present

## 2021-09-19 DIAGNOSIS — I5032 Chronic diastolic (congestive) heart failure: Secondary | ICD-10-CM | POA: Diagnosis not present

## 2021-09-19 DIAGNOSIS — M1990 Unspecified osteoarthritis, unspecified site: Secondary | ICD-10-CM | POA: Insufficient documentation

## 2021-09-19 DIAGNOSIS — F22 Delusional disorders: Secondary | ICD-10-CM | POA: Diagnosis not present

## 2021-09-19 DIAGNOSIS — E1122 Type 2 diabetes mellitus with diabetic chronic kidney disease: Secondary | ICD-10-CM | POA: Diagnosis not present

## 2021-09-19 DIAGNOSIS — N184 Chronic kidney disease, stage 4 (severe): Secondary | ICD-10-CM | POA: Diagnosis present

## 2021-09-19 DIAGNOSIS — F1421 Cocaine dependence, in remission: Secondary | ICD-10-CM | POA: Diagnosis present

## 2021-09-19 DIAGNOSIS — F1490 Cocaine use, unspecified, uncomplicated: Secondary | ICD-10-CM | POA: Insufficient documentation

## 2021-09-19 LAB — CBC
HCT: 34.7 % — ABNORMAL LOW (ref 36.0–46.0)
Hemoglobin: 10.6 g/dL — ABNORMAL LOW (ref 12.0–15.0)
MCH: 26.5 pg (ref 26.0–34.0)
MCHC: 30.5 g/dL (ref 30.0–36.0)
MCV: 86.8 fL (ref 80.0–100.0)
Platelets: 439 10*3/uL — ABNORMAL HIGH (ref 150–400)
RBC: 4 MIL/uL (ref 3.87–5.11)
RDW: 17.6 % — ABNORMAL HIGH (ref 11.5–15.5)
WBC: 6 10*3/uL (ref 4.0–10.5)
nRBC: 0 % (ref 0.0–0.2)

## 2021-09-19 LAB — URINE DRUG SCREEN, QUALITATIVE (ARMC ONLY)
Amphetamines, Ur Screen: NOT DETECTED
Barbiturates, Ur Screen: NOT DETECTED
Benzodiazepine, Ur Scrn: POSITIVE — AB
Cannabinoid 50 Ng, Ur ~~LOC~~: NOT DETECTED
Cocaine Metabolite,Ur ~~LOC~~: NOT DETECTED
MDMA (Ecstasy)Ur Screen: NOT DETECTED
Methadone Scn, Ur: NOT DETECTED
Opiate, Ur Screen: NOT DETECTED
Phencyclidine (PCP) Ur S: NOT DETECTED
Tricyclic, Ur Screen: POSITIVE — AB

## 2021-09-19 LAB — HEPATIC FUNCTION PANEL
ALT: 15 U/L (ref 0–44)
AST: 25 U/L (ref 15–41)
Albumin: 3.5 g/dL (ref 3.5–5.0)
Alkaline Phosphatase: 152 U/L — ABNORMAL HIGH (ref 38–126)
Bilirubin, Direct: 0.1 mg/dL (ref 0.0–0.2)
Indirect Bilirubin: 0.7 mg/dL (ref 0.3–0.9)
Total Bilirubin: 0.8 mg/dL (ref 0.3–1.2)
Total Protein: 6.9 g/dL (ref 6.5–8.1)

## 2021-09-19 LAB — RESP PANEL BY RT-PCR (FLU A&B, COVID) ARPGX2
Influenza A by PCR: NEGATIVE
Influenza B by PCR: NEGATIVE
SARS Coronavirus 2 by RT PCR: NEGATIVE

## 2021-09-19 LAB — BASIC METABOLIC PANEL
Anion gap: 9 (ref 5–15)
BUN: 14 mg/dL (ref 8–23)
CO2: 25 mmol/L (ref 22–32)
Calcium: 9.2 mg/dL (ref 8.9–10.3)
Chloride: 106 mmol/L (ref 98–111)
Creatinine, Ser: 1.28 mg/dL — ABNORMAL HIGH (ref 0.44–1.00)
GFR, Estimated: 47 mL/min — ABNORMAL LOW (ref >60)
Glucose, Bld: 89 mg/dL (ref 70–99)
Potassium: 2.9 mmol/L — ABNORMAL LOW (ref 3.5–5.1)
Sodium: 140 mmol/L (ref 135–145)

## 2021-09-19 LAB — TROPONIN I (HIGH SENSITIVITY): Troponin I (High Sensitivity): 8 ng/L (ref <18)

## 2021-09-19 LAB — ACETAMINOPHEN LEVEL: Acetaminophen (Tylenol), Serum: 11 ug/mL (ref 10–30)

## 2021-09-19 LAB — SALICYLATE LEVEL: Salicylate Lvl: <7 mg/dL — ABNORMAL LOW (ref 7.0–30.0)

## 2021-09-19 IMAGING — DX DG CHEST 1V PORT
1 series · 1 of 1 positions shown · non-contrast
Comparison: 03/10/2018

CLINICAL DATA: Altered mental status.

EXAM:
PORTABLE CHEST 1 VIEW

[chest ap]
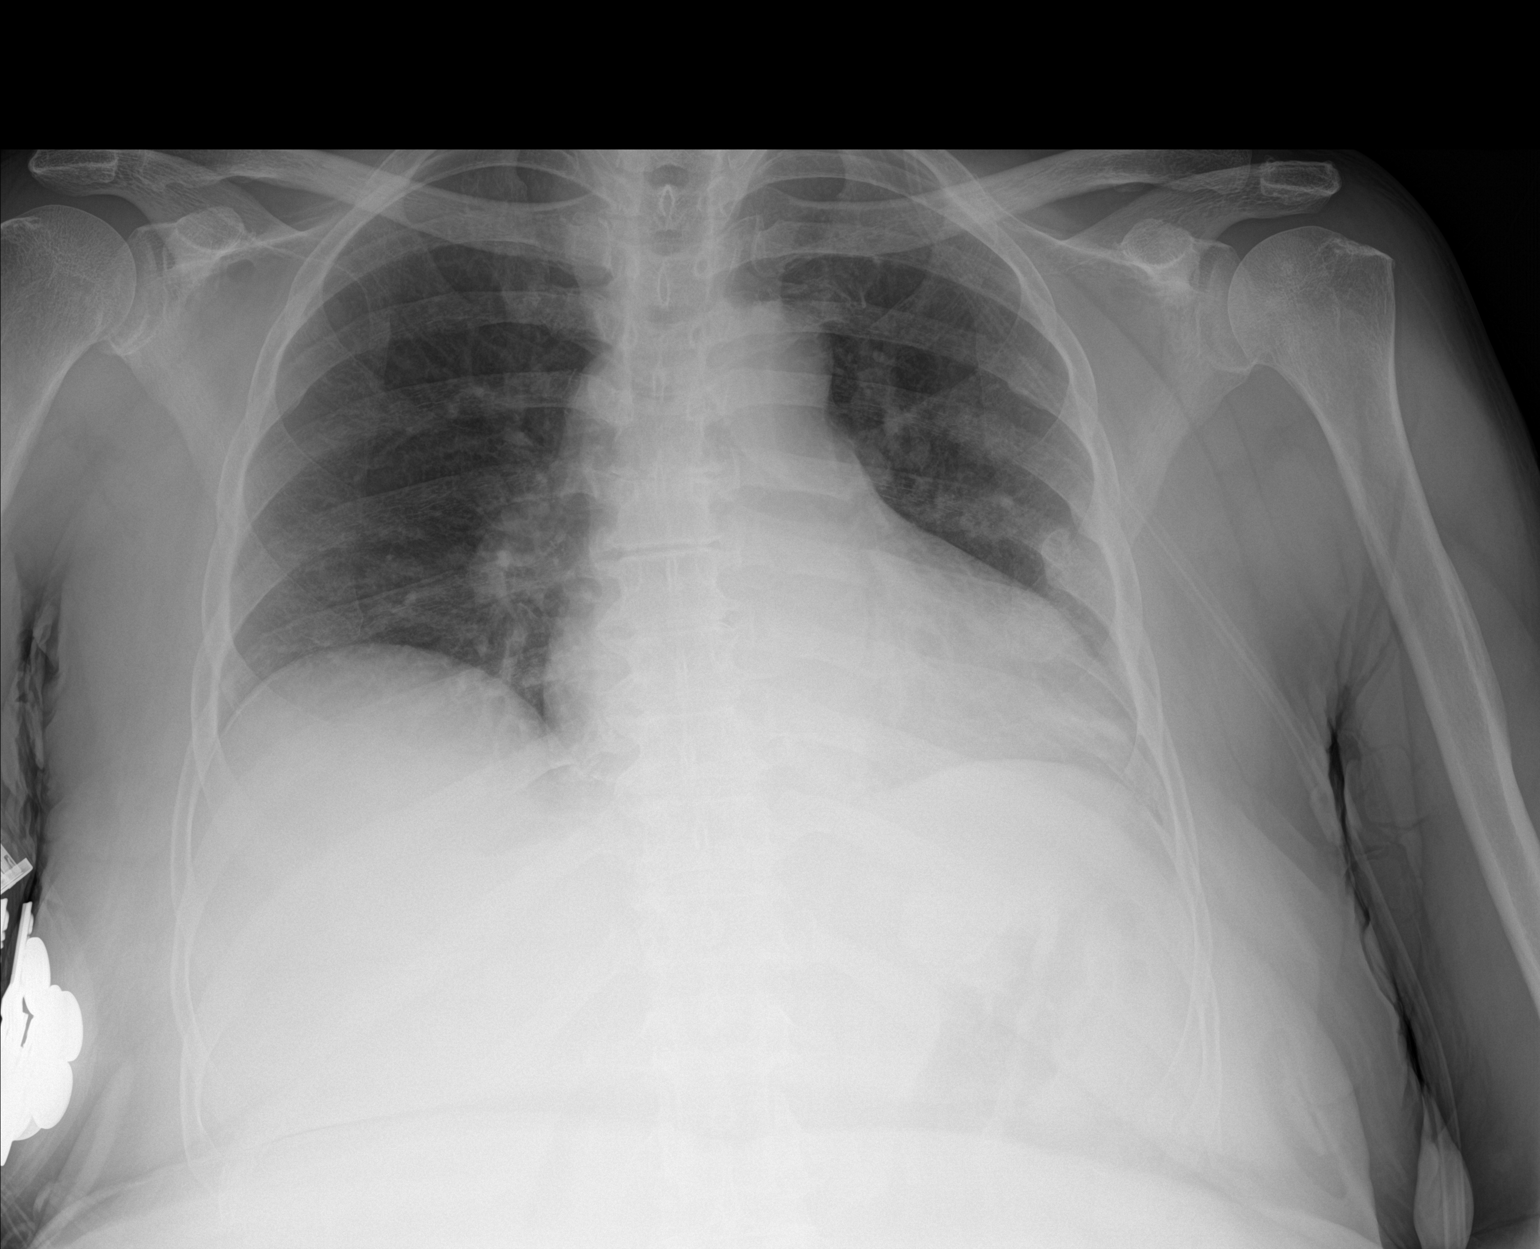

[1 of 1 positions shown; findings below may reference images not displayed]

FINDINGS: Increased opacification in the retrocardiac region tracking to the
left costodiaphragmatic sulcus since previous study. No signs of
effusion. Right chest is clear. Cardiomediastinal contours with
stable heart size accounting for depth of expansion. Healed rib
fractures noted along the left chest.
IMPRESSION: Opacity in the retrocardiac region may represent atelectasis or
developing infection

## 2021-09-19 MED ORDER — POTASSIUM CHLORIDE CRYS ER 20 MEQ PO TBCR
40.0000 meq | EXTENDED_RELEASE_TABLET | Freq: Once | ORAL | Status: AC
  Administered 2021-09-19: 40 meq via ORAL
  Filled 2021-09-19: qty 2

## 2021-09-19 MED ORDER — ACETAMINOPHEN 325 MG PO TABS
650.0000 mg | ORAL_TABLET | Freq: Once | ORAL | Status: AC
  Administered 2021-09-20: 650 mg via ORAL
  Filled 2021-09-19: qty 2
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
## 2021-09-19 NOTE — ED Notes (Addendum)
Pt dressed out with this RN and Teacher, English as a foreign language 1 gray necklace 2 gray earrings 1 gray ring Pink shirt Blue head wrap  1 black bag 1 red food lion bag   Belongings sent to security

## 2021-09-19 NOTE — Consult Note (Signed)
[facility] Consult   Reason for Consult: Leg Pain Referring Physician: Dr. Jari Pigg [name] Identification: Tiffany [name] MRN:  [id number] Principal Diagnosis: <principal problem not specified> Diagnosis:  Active Problems:   Type 2 diabetes mellitus (HCC)   Anemia   Seizure ([place])   Chronic respiratory failure with hypoxia (HCC)   Chronic diastolic congestive heart failure (HCC)   Cocaine use disorder, severe, in sustained remission (HCC)   Arthritis   Overactive bladder   Anoxic brain injury ([place])   Chronic kidney disease, stage 3b (HCC)   Binge eating disorder   Frequent falls   Drug abuse ([place])   Schizoaffective disorder, bipolar type ([place])   PTSD (post-traumatic stress disorder)   Aggressive behavior   Genital herpes   Anxiety and depression   Delusional disorder, persecutory type ([place])   Total Time spent with [name]: 1 hour  Subjective: "I do not want to go to [facility] hospital."  Tiffany [name] is a 61 y.o. female [name] presented to [facility] ED via [facility] from home. Tiffany [name] is here under involuntary commitment status (IVC). Per EMS, Tiffany [name] is a frequent flyer in [place]. Tiffany [name] was brought to Tiffany hospital with an initial complaint of bilateral leg pain. Per EMS, Tiffany [name], and Tiffany [name]'s history, she is also very paranoid. Tiffany [name] shared that she feels unsafe at her current address but does not want to move. Tiffany [name] stated, "I was beginning to be homicidal towards Tiffany people hurting me," when asked why she'd presented to Tiffany hospital. Tiffany [name] explained that she no longer wants to return to her residence due to being tortured by "people." Of note, Tiffany [name] reported that she lives alone.  This provider saw Tiffany [name] face-to-face; Tiffany chart was reviewed, and consulted with Dr. Jari Pigg on 09/19/2021 due to Tiffany [name]'s care. It was discussed with both providers that Tiffany [name] does meet Tiffany criteria to be admitted to Tiffany  psychiatric inpatient unit.  On evaluation, Tiffany [name] is alert and oriented x4, calm and cooperative, and mood-congruent with affect. Tiffany [name] does not appear to be responding to internal or external stimuli. Tiffany [name] presented with some delusional thinking. Tiffany [name] admits auditory and visual hallucinations at times, but not currently. Tiffany [name] reveals suicidal and homicidal ideations due to her living situation. Tiffany [name] denies SI/HI on today's visit. Tiffany [name] is presenting with some psychotic and paranoid behaviors. During an encounter with Tiffany [name], she could answer questions appropriately.  HPI:    Past Psychiatric History:  Manic depression ([place]) Morbid obesity ([place]) Obstructive sleep apnea Paranoid ([place]) Schizoaffective disorder, bipolar type ([place]) Seizures ([place]) Stroke ([place])  Risk to Self:   Risk to Others:   Prior Inpatient Therapy:   Prior Outpatient Therapy:    Past Medical History:  Past Medical History:  Diagnosis Date   Agitation 11/22/2017   Anoxic brain injury ([place]) 09/08/2016   C. Arrest due to respiratory failure and COPD exacerbation   Anxiety    Arthritis    "all over" (04/10/2016)   Asthma 10/18/2010   Binge eating disorder    Cardiac arrest ([place]) 09/08/2016   PEA   Carotid artery stenosis    1-39% bilateral by dopplers 11/2016   Chronic diastolic (congestive) heart failure (HCC)    Chronic kidney disease, stage 3 (HCC)    Chronic pain syndrome 06/18/2012   Chronic post-traumatic stress disorder (PTSD) 05/27/2018   Chronic respiratory failure with hypoxia and hypercapnia ([place]) 06/22/2015  TRILOGY Vent >AVAPA-ES., Vt target 200-400, Max P 30 , PS max 20 , PS min 6-10 , E Max 6, E Min 4, Rate Auto AVAPS Rate 2 (titrate for pt comfort) , bleed O2 at 5l/m continuous flow .    Closed displaced fracture of fifth metacarpal bone 03/21/2018   Cocaine use disorder, severe, in sustained remission ([place]) 17/00/1749   Complication of anesthesia     decreased bp, decreased heart rate   COPD (chronic obstructive pulmonary disease) ([place]) 07/08/2014   Depression    Diabetic neuropathy ([place]) 04/24/2011   Difficulty with speech 01/24/2018   Disorder of nervous system    Drug abuse ([place]) 11/21/2017   Dyslipidemia 04/24/2011   Elevated troponin 04/28/2012   Emphysema    Encephalopathy 11/21/2017   Essential hypertension 03/22/2016   Fibula fracture 07/10/2016   Frequent falls 10/11/2017   GERD (gastroesophageal reflux disease)    Gout 04/11/2017   Heart attack ([place]) 1980s   History of blood transfusion 1994   "couldn't stop bleeding from my period"   History of drug abuse in remission ([place]) 11/28/2015   Quit in 2017   Hyperlipidemia LDL goal <70    Incontinence    Manic depression ([place])    Morbid obesity ([place]) 10/18/2010   Obstructive sleep apnea 10/18/2010   On home oxygen therapy    "6L; 24/7" (04/10/2016)   OSA on CPAP    "wear mask sometimes" (04/10/2016)   Paranoid ([place])    "sometimes; I'm on RX for it" (04/10/2016)   Prolonged Q-T interval on ECG    Rectal bleeding 12/31/2015   Schizoaffective disorder, bipolar type ([place]) 04/05/2018   Seasonal allergies    Seborrheic keratoses 12/31/2013   Seizures ([place])    "don't know what kind; last one was ~ 1 yr ago" (04/10/2016)   Sinus bradycardia    Stroke [facility]) 1980s   denies residual on 04/10/2016   Thrush 09/19/2013   Type 2 diabetes mellitus ([place]) 10/18/2010    Past Surgical History:  Procedure Laterality Date   CESAREAN SECTION  1997   COLONOSCOPY WITH PROPOFOL N/A 04/01/2021   Procedure: COLONOSCOPY WITH PROPOFOL;  Surgeon: Carol Ada, MD;  Location: [facility];  Service: Gastroenterology;  Laterality: N/A;  Rectal bleeding with drop in hemoglobin to 7.2 g/dL   HERNIA REPAIR     IR CHOLANGIOGRAM EXISTING TUBE  07/20/2016   IR PERC CHOLECYSTOSTOMY  05/10/2016   IR RADIOLOGIST EVAL & MGMT  06/08/2016   IR RADIOLOGIST EVAL & MGMT  06/29/2016   IR SINUS/FIST TUBE  CHK-NON GI  07/12/2016   RIGHT/LEFT HEART CATH AND CORONARY ANGIOGRAPHY N/A 06/19/2017   Procedure: RIGHT/LEFT HEART CATH AND CORONARY ANGIOGRAPHY;  Surgeon: Jolaine Artist, MD;  Location: [facility];  Service: Cardiovascular;  Laterality: N/A;   TIBIA IM NAIL INSERTION Right 07/12/2016   Procedure: INTRAMEDULLARY (IM) NAIL RIGHT TIBIA;  Surgeon: Leandrew Koyanagi, MD;  Location: [facility];  Service: Orthopedics;  Laterality: Right;   UMBILICAL HERNIA REPAIR  ~ 1963   "that's why I don't have a belly button"   VAGINAL HYSTERECTOMY     Family History:  Family History  Problem Relation Age of Onset   Cancer Mother        lung   Depression Mother    Cancer Father        prostate   Depression Sister    Anxiety disorder Sister    Schizophrenia Sister    Bipolar disorder Sister  Depression Sister    Depression Brother    Heart failure Other        cousin   Family Psychiatric  History:  Social History:  Social History   Substance and Sexual Activity  Alcohol Use No   Alcohol/week: 0.0 standard drinks of alcohol     Social History   Substance and Sexual Activity  Drug Use Not Currently   Types: Cocaine   Comment: 04/10/2016 "last used cocaine back in November 2017"    Social History   Socioeconomic History   Marital status: Widowed    Spouse name: Not on file   Number of children: 3   Years of education: 12th   Highest education level: 12th grade  Occupational History   Occupation: disabled    Comment: factory production  Tobacco Use   Smoking status: Former    Packs/day: 1.50    Years: 38.00    Total pack years: 57.00    Types: Cigarettes    Start date: 03/13/1977    Quit date: 04/10/2016    Years since quitting: 5.4    Passive exposure: Past   Smokeless tobacco: Never  Vaping Use   Vaping Use: Never used  Substance and Sexual Activity   Alcohol use: No    Alcohol/week: 0.0 standard drinks of alcohol   Drug use: Not Currently    Types: Cocaine     Comment: 04/10/2016 "last used cocaine back in November 2017"   Sexual activity: Not Currently    Birth control/protection: Surgical  Other Topics Concern   Not on file  Social History Narrative   Has 1 son, [name]   Lives with son and his boyfriend   Her house has ramps and handrails should she ever needs them.    Her mother lives down Tiffany street from her and is a good support person in addition to her son.   She drives herself, has private transportation.    Cocaine free since 02/24/16, smoke free since 04/10/16   Social Determinants of Health   Financial Resource Strain: Medium Risk (07/24/2021)   Overall Financial Resource Strain (CARDIA)    Difficulty of Paying Living Expenses: Somewhat hard  Food Insecurity: No Food Insecurity (09/14/2021)   Hunger Vital Sign    Worried About Running Out of Food in Tiffany Last Year: Never true    Ran Out of Food in Tiffany Last Year: Never true  Recent Concern: Food Insecurity - Food Insecurity Present (06/23/2021)   Hunger Vital Sign    Worried About Running Out of Food in Tiffany Last Year: Never true    Ran Out of Food in Tiffany Last Year: Sometimes true  Transportation Needs: Unmet Transportation Needs (09/14/2021)   PRAPARE - Hydrologist (Medical): Yes    Lack of Transportation (Non-Medical): Yes  Physical Activity: Inactive (07/24/2021)   Exercise Vital Sign    Days of Exercise per Week: 0 days    Minutes of Exercise per Session: 0 min  Stress: Stress Concern Present (07/24/2021)   [place]    Feeling of Stress : To some extent  Social Connections: Moderately Isolated (07/24/2021)   Social Connection and Isolation Panel [NHANES]    Frequency of Communication with Friends and Family: More than three times a week    Frequency of Social Gatherings with Friends and Family: More than three times a week    Attends Religious Services: 1 to 4 times per year  Active  Member of Clubs or Organizations: No    Attends Archivist Meetings: Never    Marital Status: Widowed   Additional Social History:    Allergies:   Allergies  Allergen Reactions   Hydrocodone Shortness Of Breath   Latuda [Lurasidone Hcl] Anaphylaxis   Magnesium-Containing Compounds Anaphylaxis    Tolerated Ensure   Prednisone Anaphylaxis, Swelling and Other (See Comments)    Tongue swelling, lip swelling, throat swelling, per pt    Tramadol Anaphylaxis and Swelling   Codeine Nausea And Vomiting   Trazodone Other (See Comments)    paranoia   Topamax [Topiramate] Other (See Comments)    Increases paranoia   Sulfa Antibiotics Itching   Tape Rash    Labs:  Results for orders placed or performed during Tiffany hospital encounter of 09/19/21 (from Tiffany past 48 hour(s))  Basic metabolic panel     Status: Abnormal   Collection Time: 09/19/21  2:52 PM  Result Value Ref Range   Sodium 140 135 - 145 mmol/L   Potassium 2.9 (L) 3.5 - 5.1 mmol/L   Chloride 106 98 - 111 mmol/L   CO2 25 22 - 32 mmol/L   Glucose, Bld 89 70 - 99 mg/dL    Comment: Glucose reference range applies only to samples taken after fasting for at least 8 hours.   BUN 14 8 - 23 mg/dL   Creatinine, Ser 1.28 (H) 0.44 - 1.00 mg/dL   Calcium 9.2 8.9 - 10.3 mg/dL   GFR, Estimated 47 (L) >60 mL/min    Comment: (NOTE) Calculated using Tiffany CKD-EPI Creatinine Equation (2021)    Anion gap 9 5 - 15    Comment: Performed at [facility], [address] [place], [place] [address]  CBC     Status: Abnormal   Collection Time: 09/19/21  2:52 PM  Result Value Ref Range   WBC 6.0 4.0 - 10.5 K/uL   RBC 4.00 3.87 - 5.11 MIL/uL   Hemoglobin 10.6 (L) 12.0 - 15.0 g/dL   HCT 34.7 (L) 36.0 - 46.0 %   MCV 86.8 80.0 - 100.0 fL   MCH 26.5 26.0 - 34.0 pg   MCHC 30.5 30.0 - 36.0 g/dL   RDW 17.6 (H) 11.5 - 15.5 %   Platelets 439 (H) 150 - 400 K/uL   nRBC 0.0 0.0 - 0.2 %    Comment: Performed at [facility],  [address], [place], [place] [address]  Troponin I (High Sensitivity)     Status: None   Collection Time: 09/19/21  2:52 PM  Result Value Ref Range   Troponin I (High Sensitivity) 8 <18 ng/L    Comment: (NOTE) Elevated high sensitivity troponin I (hsTnI) values and significant  changes across serial measurements may suggest ACS but many other  chronic and acute conditions are known to elevate hsTnI results.  Refer to Tiffany "Links" section for chest pain algorithms and additional  guidance. Performed at [facility], [address] [place], [place] [address]   Hepatic function panel     Status: Abnormal   Collection Time: 09/19/21  2:52 PM  Result Value Ref Range   Total Protein 6.9 6.5 - 8.1 g/dL   Albumin 3.5 3.5 - 5.0 g/dL   AST 25 15 - 41 U/L   ALT 15 0 - 44 U/L   Alkaline Phosphatase 152 (H) 38 - 126 U/L   Total Bilirubin 0.8 0.3 - 1.2 mg/dL   Bilirubin, Direct 0.1 0.0 -  0.2 mg/dL   Indirect Bilirubin 0.7 0.3 - 0.9 mg/dL    Comment: Performed at [facility], [address] [place], [place] [address]  Urine Drug Screen, Qualitative     Status: Abnormal   Collection Time: 09/19/21  7:10 PM  Result Value Ref Range   Tricyclic, Ur Screen POSITIVE (A) NONE DETECTED   Amphetamines, Ur Screen NONE DETECTED NONE DETECTED   MDMA (Ecstasy)Ur Screen NONE DETECTED NONE DETECTED   Cocaine Metabolite,Ur [place] NONE DETECTED NONE DETECTED   Opiate, Ur Screen NONE DETECTED NONE DETECTED   Phencyclidine (PCP) Ur S NONE DETECTED NONE DETECTED   Cannabinoid 50 Ng, Ur [place] NONE DETECTED NONE DETECTED   Barbiturates, Ur Screen NONE DETECTED NONE DETECTED   Benzodiazepine, Ur Scrn POSITIVE (A) NONE DETECTED   Methadone Scn, Ur NONE DETECTED NONE DETECTED    Comment: (NOTE) Tricyclics + metabolites, urine    Cutoff 1000 ng/mL Amphetamines + metabolites, urine  Cutoff 1000 ng/mL MDMA (Ecstasy), urine              Cutoff 500 ng/mL Cocaine Metabolite, urine          Cutoff  300 ng/mL Opiate + metabolites, urine        Cutoff 300 ng/mL Phencyclidine (PCP), urine         Cutoff 25 ng/mL Cannabinoid, urine                 Cutoff 50 ng/mL Barbiturates + metabolites, urine  Cutoff 200 ng/mL Benzodiazepine, urine              Cutoff 200 ng/mL Methadone, urine                   Cutoff 300 ng/mL  Tiffany urine drug screen provides only a preliminary, unconfirmed analytical test result and should not be used for non-medical purposes. Clinical consideration and professional judgment should be applied to any positive drug screen result due to possible interfering substances. A more specific alternate chemical method must be used in order to obtain a confirmed analytical result. Gas chromatography / mass spectrometry (GC/MS) is Tiffany preferred confirm atory method. Performed at [facility], [address] [place], [place] [address]   Resp Panel by RT-PCR (Flu A&B, Covid)     Status: None   Collection Time: 09/19/21  7:10 PM   Specimen: Nasal Swab  Result Value Ref Range   SARS Coronavirus 2 by RT PCR NEGATIVE NEGATIVE    Comment: (NOTE) SARS-CoV-2 target nucleic acids are NOT DETECTED.  Tiffany SARS-CoV-2 RNA is generally detectable in upper respiratory specimens during Tiffany acute phase of infection. Tiffany lowest concentration of SARS-CoV-2 viral copies this assay can detect is 138 copies/mL. A negative result does not preclude SARS-Cov-2 infection and should not be used as Tiffany sole basis for treatment or other [name] management decisions. A negative result may occur with  improper specimen collection/handling, submission of specimen other than nasopharyngeal swab, presence of viral mutation(s) within Tiffany areas targeted by this assay, and inadequate number of viral copies(<138 copies/mL). A negative result must be combined with clinical observations, [name] history, and epidemiological information. Tiffany expected result is Negative.  Fact Sheet for Patients:   EntrepreneurPulse.com.au  Fact Sheet for Healthcare Providers:  IncredibleEmployment.be  This test is no t yet approved or cleared by Tiffany [place] FDA and  has been authorized for detection and/or diagnosis of SARS-CoV-2 by FDA under an Emergency Use Authorization (EUA). This EUA will remain  in effect (meaning this test can be used) for Tiffany duration of Tiffany COVID-19 declaration under Section 564(b)(1) of Tiffany Act, 21 U.S.C.section 360bbb-3(b)(1), unless Tiffany authorization is terminated  or revoked sooner.       Influenza A by PCR NEGATIVE NEGATIVE   Influenza B by PCR NEGATIVE NEGATIVE    Comment: (NOTE) Tiffany Xpert Xpress SARS-CoV-2/FLU/RSV plus assay is intended as an aid in Tiffany diagnosis of influenza from Nasopharyngeal swab specimens and should not be used as a sole basis for treatment. Nasal washings and aspirates are unacceptable for Xpert Xpress SARS-CoV-2/FLU/RSV testing.  Fact Sheet for Patients: EntrepreneurPulse.com.au  Fact Sheet for Healthcare Providers: IncredibleEmployment.be  This test is not yet approved or cleared by Tiffany [place] FDA and has been authorized for detection and/or diagnosis of SARS-CoV-2 by FDA under an Emergency Use Authorization (EUA). This EUA will remain in effect (meaning this test can be used) for Tiffany duration of Tiffany COVID-19 declaration under Section 564(b)(1) of Tiffany Act, 21 U.S.C. section 360bbb-3(b)(1), unless Tiffany authorization is terminated or revoked.  Performed at [facility], [address] [place], [place] [address]   Salicylate level     Status: Abnormal   Collection Time: 09/19/21  7:29 PM  Result Value Ref Range   Salicylate Lvl <2.6 (L) 7.0 - 30.0 mg/dL    Comment: Performed at [facility], [address] [place], [place] [address]  Acetaminophen level     Status: None   Collection Time: 09/19/21  7:29 PM  Result Value  Ref Range   Acetaminophen (Tylenol), Serum 11 10 - 30 ug/mL    Comment: (NOTE) Therapeutic concentrations vary significantly. A range of 10-30 ug/mL  may be an effective concentration for many patients. However, some  are best treated at concentrations outside of this range. Acetaminophen concentrations >150 ug/mL at 4 hours after ingestion  and >50 ug/mL at 12 hours after ingestion are often associated with  toxic reactions.  Performed at [facility], [address] [place], [place] [address]    *Note: Due to a large number of results and/or encounters for Tiffany requested time period, some results have not been displayed. A complete set of results can be found in Results Review.    Current Facility-Administered Medications  Medication Dose Route Frequency Provider Last Rate Last Admin   acetaminophen (TYLENOL) tablet 650 mg  650 mg Oral Once Lucillie Garfinkel, MD       Current Outpatient Medications  Medication Sig Dispense Refill   albuterol (VENTOLIN HFA) 108 (90 Base) MCG/ACT inhaler INHALE 2 PUFFS INTO Tiffany LUNGS EVERY 6 (SIX) HOURS AS NEEDED FOR WHEEZING OR SHORTNESS OF BREATH. 8.5 g 2   amLODipine (NORVASC) 5 MG tablet TAKE 1 TABLET (5 MG TOTAL) BY MOUTH DAILY. 90 tablet 0   Asenapine Maleate 10 MG SUBL Place 1 tablet (10 mg total) under Tiffany tongue 2 (two) times daily. 10 tablet 0   atorvastatin (LIPITOR) 20 MG tablet Take 1 tablet (20 mg total) by mouth daily. 90 tablet 1   carvedilol (COREG) 6.25 MG tablet TAKE 1 TABLET(6.25 MG) BY MOUTH TWICE DAILY WITH A MEAL 180 tablet 1   colchicine 0.6 MG tablet TAKE 2 TABS (1.2 MG) ORALLY AT Tiffany ONSET OF GOUT FLARE, MAY REPEAT 1 TAB (0.6 MG) 1 HOUR IF SYMPTOMS PERSIST 30 tablet 1   cyclobenzaprine (FLEXERIL) 10 MG tablet Take 10 mg by mouth 2 (two) times daily as needed.     diazepam (VALIUM) 5 MG  tablet Take 1 tablet (5 mg total) by mouth daily as needed for anxiety. 30 tablet 0   diclofenac Sodium (VOLTAREN) 1 % GEL Apply 4 g  topically 4 (four) times daily as needed (pain). ([name] taking differently: Apply 1 application  topically 3 (three) times daily.) 100 g 3   dicyclomine (BENTYL) 10 MG capsule Take 1 capsule (10 mg total) by mouth 3 (three) times daily before meals. 90 capsule 6   DULoxetine (CYMBALTA) 30 MG capsule Take 1 capsule (30 mg total) by mouth every morning. 30 capsule 2   febuxostat (ULORIC) 40 MG tablet TAKE 1 TABLET (40 MG TOTAL) BY MOUTH DAILY. 30 tablet 3   Fluticasone-Umeclidin-Vilant (TRELEGY ELLIPTA) 100-62.5-25 MCG/ACT AEPB Inhale 1 puff into Tiffany lungs daily. 60 each 6   Iron, Ferrous Sulfate, 325 (65 Fe) MG TABS Take 325 mg by mouth daily. 60 tablet 2   loperamide (IMODIUM) 2 MG capsule Take 2 capsules (4 mg total) by mouth 3 (three) times daily as needed for diarrhea or loose stools. 30 capsule 0   methocarbamol (ROBAXIN) 500 MG tablet TAKE 1 TABLET(500 MG) BY MOUTH TWICE DAILY 30 tablet 1   montelukast (SINGULAIR) 10 MG tablet TAKE 1 TABLET (10 MG TOTAL) BY MOUTH AT BEDTIME. 30 tablet 2   omeprazole (PRILOSEC) 40 MG capsule Take 1 capsule (40 mg total) by mouth every morning. 30 capsule 6   ondansetron (ZOFRAN-ODT) 4 MG disintegrating tablet Take 1 tablet (4 mg total) by mouth every 4 (four) hours as needed for nausea or vomiting. 20 tablet 0   oxybutynin (DITROPAN) 5 MG tablet Take 1 tablet (5 mg total) by mouth 2 (two) times daily. 60 tablet 5   potassium chloride SA (KLOR-CON M) 20 MEQ tablet Take 1 tablet (20 mEq total) by mouth daily. 30 tablet 6   potassium chloride SA (KLOR-CON M) 20 MEQ tablet Take 1 tablet (20 mEq total) by mouth 2 (two) times daily. 12 tablet 0   prazosin (MINIPRESS) 2 MG capsule Take 1 capsule (2 mg total) by mouth at bedtime. TAKE 1 CAPSULE(2 MG) BY MOUTH AT BEDTIME Strength: 2 mg ([name] taking differently: Take 2 mg by mouth at bedtime.) 30 capsule 6   pregabalin (LYRICA) 75 MG capsule Take 1 capsule (75 mg total) by mouth 2 (two) times daily. 60 capsule 0    terbinafine (LAMISIL AT) 1 % cream Apply 1 application  topically 2 (two) times daily. 30 g 1   torsemide (DEMADEX) 20 MG tablet TAKE 1 TABLET(20 MG) BY MOUTH DAILY 90 tablet 1    Musculoskeletal: Strength & Muscle Tone: decreased Gait & Station: unsteady [name] leans: Backward  Psychiatric Specialty Exam:  Presentation  General Appearance: Appropriate for Environment; Fairly Groomed; Well Groomed  Eye Contact:Good  Speech:Clear and Coherent; Normal Rate  Speech Volume:Normal  Handedness:Right   Mood and Affect  Mood:Euthymic  Affect:Inappropriate   Thought Process  Thought Processes:Coherent; Goal Directed  Descriptions of Associations:Tangential  Orientation:Full (Time, Place and Person)  Thought Content:Illogical; Obsessions; Paranoid Ideation; Logical  History of Schizophrenia/Schizoaffective disorder:Yes  Duration of Psychotic Symptoms:Greater than six months  Hallucinations:Hallucinations: None  Ideas of Reference:Delusions; Paranoia  Suicidal Thoughts:Suicidal Thoughts: Yes, Passive SI Passive Intent and/or Plan: Without Intent; Without Plan  Homicidal Thoughts:Homicidal Thoughts: Yes, Passive HI Passive Intent and/or Plan: Without Intent   Sensorium  Memory:Immediate Fair; Recent Fair; Remote Fair  Judgment:Poor  Insight:Poor   Executive Functions  Concentration:Good  Attention Span:Good  [place]  Language:Fair  Psychomotor Activity  Psychomotor Activity:Psychomotor Activity: Normal   Assets  Assets:Communication Skills; Desire for Improvement; Physical Health; Resilience; Social Support   Sleep  Sleep:Sleep: Good   Physical Exam: Physical Exam Vitals and nursing note reviewed.  Constitutional:      Appearance: [name] normal weight.  HENT:     Head: Normocephalic and atraumatic.     Right Ear: External ear normal.     Left Ear: External ear normal.     Nose: Nose normal.      Mouth/Throat:     Mouth: Mucous membranes are moist.  Cardiovascular:     Rate and Rhythm: Normal rate.     Pulses: Normal pulses.  Pulmonary:     Effort: Pulmonary effort is normal.  Musculoskeletal:        General: Tenderness present.     Cervical back: Normal range of motion and neck supple.  Neurological:     Mental Status: [name] alert and oriented to person, place, and time.     Gait: Gait abnormal.  Psychiatric:        Mood and Affect: Mood is depressed. Affect is blunt and inappropriate.        Speech: Speech is tangential.        Behavior: Behavior normal.        Thought Content: Thought content is paranoid and delusional. Thought content includes suicidal ideation.        Judgment: Judgment is inappropriate.    Review of Systems  Psychiatric/Behavioral:  Positive for depression, hallucinations and suicidal ideas. Tiffany [name] is nervous/anxious.   All other systems reviewed and are negative.  Blood pressure (!) 130/93, pulse 90, temperature 98.1 F (36.7 C), temperature source Oral, resp. rate 18, height '5\' 4"'$  (1.626 m), weight 74.8 kg, SpO2 94 %. Body mass index is 28.31 kg/m.  Treatment Plan Summary: Medication management and Plan [name] does meet criteria for geriatric-psychiatric inpatient admission  Disposition: Recommend psychiatric Inpatient admission when medically cleared. Supportive therapy provided about ongoing stressors.  Caroline Sauger, NP 09/19/2021 10:22 PM

## 2021-09-19 NOTE — ED Triage Notes (Signed)
Pt here with bilateral leg pain. While talking to pt, pt stated that her chest feel uncomfortable and tight. Pt states that both of her legs have been hurting her for while.

## 2021-09-19 NOTE — ED Notes (Signed)
See triage note. When asked what brings pt to ED, pt states she does not feel comfortable talking about it in hallway.
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
## 2021-09-19 NOTE — ED Triage Notes (Signed)
First Nurse Note:  Pt via [facility] from home. Per EMS, pt is a frequent flyer in [place]. Pt c/o bilateral leg pain. Per EMS, pt is also very paranoid stating she does not feel safe at her current address but states that she doesn't want to move. Pt is known to be combative and refuse care per EMS. Pt would not allow EMS to get repeat VS. Pt is A&Ox4 and NAD  106 HR  96% on RA 136/72 18 RR

## 2021-09-19 NOTE — ED Notes (Signed)
Dr Jacelyn Grip notified of difficulty obtaining lab work
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
## 2021-09-19 NOTE — ED Notes (Signed)
Pt refused XR at this time. Pt will not remove items out of her front pocket and will not allow XR to change her into a hospital gown.

## 2021-09-19 NOTE — ED Notes (Signed)
IVC PENDING  CONSULT ?

## 2021-09-19 NOTE — ED Notes (Signed)
Sent to security: 1 gray necklace 2 gray earrings 1 gray ring 1 set of keys 1 wallet 1 debit card $100 (7) $20 (1) $10 (2) $1(4) Deposit slip

## 2021-09-19 NOTE — ED Notes (Signed)
Pt denied vitals

## 2021-09-20 DIAGNOSIS — F250 Schizoaffective disorder, bipolar type: Secondary | ICD-10-CM | POA: Diagnosis not present

## 2021-09-20 MED ORDER — AMLODIPINE BESYLATE 5 MG PO TABS
5.0000 mg | ORAL_TABLET | Freq: Once | ORAL | Status: AC
  Administered 2021-09-20: 5 mg via ORAL
  Filled 2021-09-20: qty 1

## 2021-09-20 MED ORDER — ACETAMINOPHEN 325 MG PO TABS
650.0000 mg | ORAL_TABLET | Freq: Once | ORAL | Status: AC
  Administered 2021-09-21: 650 mg via ORAL
  Filled 2021-09-20: qty 2

## 2021-09-20 MED ORDER — ATORVASTATIN CALCIUM 20 MG PO TABS
20.0000 mg | ORAL_TABLET | Freq: Every day | ORAL | Status: DC
  Administered 2021-09-20 – 2021-09-21 (×2): 20 mg via ORAL
  Filled 2021-09-20 (×2): qty 1

## 2021-09-20 MED ORDER — LOPERAMIDE HCL 2 MG PO CAPS
4.0000 mg | ORAL_CAPSULE | ORAL | Status: DC | PRN
  Administered 2021-09-20: 4 mg via ORAL
  Filled 2021-09-20: qty 2

## 2021-09-20 MED ORDER — DICYCLOMINE HCL 10 MG PO CAPS
10.0000 mg | ORAL_CAPSULE | Freq: Three times a day (TID) | ORAL | Status: DC
  Administered 2021-09-20 – 2021-09-21 (×3): 10 mg via ORAL
  Filled 2021-09-20 (×3): qty 1

## 2021-09-20 MED ORDER — POTASSIUM CHLORIDE 20 MEQ PO PACK
20.0000 meq | PACK | Freq: Once | ORAL | Status: DC
  Filled 2021-09-20: qty 1

## 2021-09-20 MED ORDER — POTASSIUM CHLORIDE CRYS ER 20 MEQ PO TBCR
40.0000 meq | EXTENDED_RELEASE_TABLET | Freq: Once | ORAL | Status: AC
  Administered 2021-09-20: 40 meq via ORAL
  Filled 2021-09-20: qty 2

## 2021-09-20 MED ORDER — OXYBUTYNIN CHLORIDE 5 MG PO TABS
5.0000 mg | ORAL_TABLET | Freq: Two times a day (BID) | ORAL | Status: DC
  Administered 2021-09-21: 5 mg via ORAL
  Filled 2021-09-20 (×3): qty 1

## 2021-09-20 MED ORDER — MONTELUKAST SODIUM 10 MG PO TABS
10.0000 mg | ORAL_TABLET | Freq: Every day | ORAL | Status: DC
  Administered 2021-09-20: 10 mg via ORAL
  Filled 2021-09-20: qty 1

## 2021-09-20 MED ORDER — TORSEMIDE 20 MG PO TABS
20.0000 mg | ORAL_TABLET | Freq: Every day | ORAL | Status: DC
  Administered 2021-09-20 – 2021-09-21 (×2): 20 mg via ORAL
  Filled 2021-09-20 (×2): qty 1

## 2021-09-20 MED ORDER — LOPERAMIDE HCL 2 MG PO CAPS: 4.0000 mg | ORAL_CAPSULE | Freq: Three times a day (TID) | ORAL | Status: DC | PRN

## 2021-09-20 MED ORDER — CEPHALEXIN 500 MG PO CAPS
500.0000 mg | ORAL_CAPSULE | Freq: Three times a day (TID) | ORAL | Status: DC
  Administered 2021-09-20 – 2021-09-21 (×4): 500 mg via ORAL
  Filled 2021-09-20 (×4): qty 1

## 2021-09-20 MED ORDER — CYCLOBENZAPRINE HCL 10 MG PO TABS
10.0000 mg | ORAL_TABLET | Freq: Three times a day (TID) | ORAL | Status: DC | PRN
  Administered 2021-09-20: 10 mg via ORAL
  Filled 2021-09-20: qty 1

## 2021-09-20 MED ORDER — CLONIDINE HCL 0.1 MG PO TABS
0.1000 mg | ORAL_TABLET | Freq: Once | ORAL | Status: DC
  Filled 2021-09-20: qty 1

## 2021-09-20 MED ORDER — CARVEDILOL 6.25 MG PO TABS: 6.2500 mg | ORAL_TABLET | Freq: Two times a day (BID) | ORAL | Status: DC

## 2021-09-20 MED ORDER — LOPERAMIDE HCL 2 MG PO CAPS
4.0000 mg | ORAL_CAPSULE | Freq: Four times a day (QID) | ORAL | Status: DC | PRN
  Filled 2021-09-20: qty 2

## 2021-09-20 MED ORDER — ALBUTEROL SULFATE (2.5 MG/3ML) 0.083% IN NEBU
3.0000 mL | INHALATION_SOLUTION | Freq: Four times a day (QID) | RESPIRATORY_TRACT | Status: DC | PRN
  Filled 2021-09-20: qty 3

## 2021-09-20 MED ORDER — PANTOPRAZOLE SODIUM 40 MG PO TBEC
80.0000 mg | DELAYED_RELEASE_TABLET | Freq: Every day | ORAL | Status: DC
  Administered 2021-09-20 – 2021-09-21 (×2): 80 mg via ORAL
  Filled 2021-09-20 (×2): qty 2

## 2021-09-20 MED ORDER — CARVEDILOL 6.25 MG PO TABS
6.2500 mg | ORAL_TABLET | Freq: Two times a day (BID) | ORAL | Status: DC
  Administered 2021-09-20 – 2021-09-21 (×3): 6.25 mg via ORAL
  Filled 2021-09-20 (×3): qty 1

## 2021-09-20 NOTE — ED Notes (Signed)
Patient requested a ginger ale. Patient was given a ginger ale. Patient stated that she was wet and needed to be changed. Assisted with changing patient but patient had the cup in her hand. Advised that I would need to sit the cup on the bedside table. Patient refused and stated that she was uncomfortable. RN Maudie Mercury came to assist me with changing patient. In the process patient became agitated and refused to roll over so that we could continue to change her. Change linen and placed clean brief on patient. Placed covers back on her and she stated they were wet. Placed warm/dry blankets on her. Patient advised not to unfold. While trying to place blankets on her she stated "get that out of my face" and "get out" and called me "rude" and that "we need to do better".

## 2021-09-20 NOTE — ED Notes (Signed)
Pt incontenent of stool. Pt cleaned and brief changed. New blankets provided.

## 2021-09-20 NOTE — BH Assessment (Signed)
Patient is under review at [facility].

## 2021-09-20 NOTE — ED Notes (Signed)
Ivc / recommend psychiatric inpatient admission when medically cleared

## 2021-09-20 NOTE — ED Notes (Signed)
IVC/  PENDING  PLACEMENT 

## 2021-09-20 NOTE — ED Notes (Signed)
Patient set up with breakfast tray 

## 2021-09-20 NOTE — BH Assessment (Signed)
Referral information for Psychiatric Hospitalization faxed to:  [facility] [phone]) No appropriate beds available.  [facility] [phone]),   [facility] [phone]) [phone]  [facility]ana [phone],  [facility]([phone],   RGrier [phone]  TBoykin [phone],   MAdela Ports([phone]

## 2021-09-20 NOTE — ED Notes (Signed)
Answered patient's call light. Patient requesting to have telephone. Patient informed that phone hours are not until 9 AM. Patient states that no one has been helping her. Patient pressed call light while RN was in room. RN informed patient that she did not need to press call light as RN is currently at bedside to assist patient. Call light cancelled by RN. Patient pressed call light again, states, "Call security, call police, because I am getting out of here." Patient informed that IVC orders have been placed and she is not able to leave hospital at this time. Patient currently refusing to take night time medication. RN asked patient if she had been incontinent of urine. Patient states, "What does that matter to you?" RN informed patient that she needed to be cleaned up as lying in urine was not good for her skin. Patient refusing to comply with care. Patient requesting to speak with charge nurse. RN left room without being able to provide care at this time.

## 2021-09-20 NOTE — ED Notes (Signed)
This RN answered call light. Patient continues to be argumentative and uncooperative with care.
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
## 2021-09-20 NOTE — ED Notes (Signed)
Psych consult at bedside.

## 2021-09-20 NOTE — ED Provider Notes (Signed)
Emergency Medicine Observation Re-evaluation Note  Tiffany [name] is a 61 y.o. female, seen on rounds today.  Pt initially presented to the ED for complaints of Leg Pain Currently, the patient is resting.  Physical Exam  BP (!) 130/93 (BP Location: Left Arm)   Pulse 90   Temp 98.1 F (36.7 C) (Oral)   Resp 18   Ht 1.626 m ('5\' 4"'$ )   Wt 74.8 kg   SpO2 94%   BMI 28.31 kg/m  Physical Exam Gen:  No acute distress Resp:  Breathing easily and comfortably, no accessory muscle usage Neuro:  Moving all four extremities, no gross focal neuro deficits Psych:  Resting currently, calm when awake  ED Course / MDM  EKG:   I have reviewed the labs performed to date as well as medications administered while in observation.  Recent changes in the last 24 hours include initial evaluation by EDP and psychiatry.  Plan  Current plan is for psychiatric admission.    Hinda Kehr, MD 09/20/21 [phone]

## 2021-09-20 NOTE — ED Notes (Signed)
Two warm blankets given per patient request

## 2021-09-20 NOTE — ED Notes (Signed)
Ed Engineer, building services to assisted with patient. Patient became upset due to writer having to roll patient to get wet bed linens off bed and replace with dry ones.  Writer explained the importance of bed linens being changed to prevent skin breakdown. Patient started fussing at this Probation officer. Writer walked out of room.

## 2021-09-20 NOTE — ED Notes (Signed)
Pt cleaned of stool, brief changed. Pt given sprite.

## 2021-09-20 NOTE — ED Notes (Signed)
Patient cleaned. Incontinent of stool. Patient requesting imodium. MD made aware. New brief and purwick placed. Patient pulled up in bed. Socks placed on patient. Warm blanket given

## 2021-09-20 NOTE — ED Notes (Signed)
This RN spent >30 minutes attempting to discuss plan of care and medications with patient. Patient stated she would take imodium. RN attempted to scan armband, however armband not found on patient. New armband printed. RN asked patient to state name and DOB. Patient refused to state name and DOB. This RN was prepared to place patient in hospital bed, however patient not cooperative with care and very argumentative with RN. Verbal discussion ineffective and RN unable to provide care for patient.

## 2021-09-20 NOTE — ED Notes (Signed)
Pt very emotional about NT coming in her room and turning on lights as well as some confusion about getting certain meds and not understanding the reason behind them. I explained the purpose and reason behind each medication she has been given to her satisfaction and explained the reasoning behind the NT doing her routine checks on patients. Pt crying but understood reasons. She also mentioned how hard life is right now with everything going on in her life and how she is in a very volnerable state. I reassured her she is being taken care of and we are trying to get her into an admitting room so we can continue to care for her.
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
## 2021-09-20 NOTE — ED Notes (Signed)
Nothing offered or given. Was advised by Charge, that no one is to go into room.

## 2021-09-21 DIAGNOSIS — F250 Schizoaffective disorder, bipolar type: Secondary | ICD-10-CM | POA: Diagnosis not present

## 2021-09-21 MED ORDER — SUCRALFATE 1 G PO TABS
1.0000 g | ORAL_TABLET | Freq: Once | ORAL | Status: AC
  Administered 2021-09-21: 1 g via ORAL
  Filled 2021-09-21: qty 1

## 2021-09-21 MED ORDER — LIDOCAINE VISCOUS HCL 2 % MT SOLN
15.0000 mL | Freq: Once | OROMUCOSAL | Status: AC
  Administered 2021-09-21: 15 mL via OROMUCOSAL
  Filled 2021-09-21: qty 15

## 2021-09-21 MED ORDER — ACETAMINOPHEN 325 MG PO TABS
650.0000 mg | ORAL_TABLET | Freq: Four times a day (QID) | ORAL | Status: DC | PRN
  Administered 2021-09-21: 650 mg via ORAL
  Filled 2021-09-21: qty 2

## 2021-09-21 NOTE — ED Notes (Signed)
Pt given lunch tray. Pt assisted to use bedside toilet as requested; yellow non-slip socks applied to pt's feet; pt back into bed.

## 2021-09-21 NOTE — Consult Note (Signed)
[facility] Face-to-Face Psychiatry Consult   Reason for Consult: Leg Pain Referring Physician: Dr. Jari Pigg Patient Identification: Tiffany [name] MRN:  [id number] Principal Diagnosis: Schizoaffective disorder, bipolar type Diagnosis:  Active Problems:   Schizoaffective disorder, bipolar type ([place])   Type 2 diabetes mellitus ([place])   Anemia   Seizure ([place])   Chronic respiratory failure with hypoxia (HCC)   Chronic diastolic congestive heart failure (HCC)   Cocaine use disorder, severe, in sustained remission (HCC)   Arthritis   Overactive bladder   Anoxic brain injury ([place])   Chronic kidney disease, stage 3b ([place])   Binge eating disorder   Frequent falls   Drug abuse ([place])   PTSD (post-traumatic stress disorder)   Aggressive behavior   Genital herpes   Anxiety and depression   Delusional disorder, persecutory type ([place])   Total Time spent with patient: 30 minutes  Subjective: "I'm not aggravated with no one."  Today, the client is pleasant and reports she no longer feels agitated.  On admission she felt the Cymbalta initiation made her agitated with people and started having homicidal ideations.  These have resolved, no threats to self or others.  Sleep and appetite are good.  No hallucinations or delusions.  She feels safe returning home and asks for a phone to call for a ride, psych cleared for discharge.  On admission per Ysidro Evert, PMHNP Tiffany [name] is a 61 y.o. female patient presented to [facility] ED via [facility] from home. The patient is here under involuntary commitment status (IVC). Per EMS, the patient is a frequent flyer in [employer]. The patient was brought to the hospital with an initial complaint of bilateral leg pain. Per EMS, the patient, and the patient's history, she is also very paranoid. The patient shared that she feels unsafe at her current address but does not want to move. The patient stated, "I was beginning to be homicidal towards the people hurting me,"  when asked why she'd presented to the hospital. The patient explained that she no longer wants to return to her residence due to being tortured by "people." Of note, The patient reported that she lives alone.  This provider saw The patient face-to-face; the chart was reviewed, and consulted with Dr. Jari Pigg on 09/19/2021 due to the patient's care. It was discussed with both providers that the patient does meet the criteria to be admitted to the psychiatric inpatient unit.  On evaluation, the patient is alert and oriented x4, calm and cooperative, and mood-congruent with affect. The patient does not appear to be responding to internal or external stimuli. The patient presented with some delusional thinking. The patient admits auditory and visual hallucinations at times, but not currently. The patient reveals suicidal and homicidal ideations due to her living situation. The patient denies SI/HI on today's visit. The patient is presenting with some psychotic and paranoid behaviors. During an encounter with the patient, she could answer questions appropriately.   Past Psychiatric History:  Manic depression ([place]) Morbid obesity ([place]) Obstructive sleep apnea Paranoid ([place]) Schizoaffective disorder, bipolar type ([place]) Seizures ([place]) Stroke ([place])  Risk to Self:  none Risk to Others:  none Prior Inpatient Therapy:  yes Prior Outpatient Therapy:  "private psychiatrist"  Past Medical History:  Past Medical History:  Diagnosis Date   Agitation 11/22/2017   Anoxic brain injury ([place]) 09/08/2016   C. Arrest due to respiratory failure and COPD exacerbation   Anxiety    Arthritis    "all over" (04/10/2016)   Asthma  10/18/2010   Binge eating disorder    Cardiac arrest ([place]) 09/08/2016   PEA   Carotid artery stenosis    1-39% bilateral by dopplers 11/2016   Chronic diastolic (congestive) heart failure (HCC)    Chronic kidney disease, stage 3 (HCC)    Chronic pain syndrome 06/18/2012   Chronic  post-traumatic stress disorder (PTSD) 05/27/2018   Chronic respiratory failure with hypoxia and hypercapnia (HCC) 06/22/2015   TRILOGY Vent >AVAPA-ES., Vt target 200-400, Max P 30 , PS max 20 , PS min 6-10 , E Max 6, E Min 4, Rate Auto AVAPS Rate 2 (titrate for pt comfort) , bleed O2 at 5l/m continuous flow .    Closed displaced fracture of fifth metacarpal bone 03/21/2018   Cocaine use disorder, severe, in sustained remission ([place]) 15/40/0867   Complication of anesthesia    decreased bp, decreased heart rate   COPD (chronic obstructive pulmonary disease) ([place]) 07/08/2014   Depression    Diabetic neuropathy ([place]) 04/24/2011   Difficulty with speech 01/24/2018   Disorder of nervous system    Drug abuse ([place]) 11/21/2017   Dyslipidemia 04/24/2011   Elevated troponin 04/28/2012   Emphysema    Encephalopathy 11/21/2017   Essential hypertension 03/22/2016   Fibula fracture 07/10/2016   Frequent falls 10/11/2017   GERD (gastroesophageal reflux disease)    Gout 04/11/2017   Heart attack ([place]) 1980s   History of blood transfusion 1994   "couldn't stop bleeding from my period"   History of drug abuse in remission ([place]) 11/28/2015   Quit in 2017   Hyperlipidemia LDL goal <70    Incontinence    Manic depression ([place])    Morbid obesity ([place]) 10/18/2010   Obstructive sleep apnea 10/18/2010   On home oxygen therapy    "6L; 24/7" (04/10/2016)   OSA on CPAP    "wear mask sometimes" (04/10/2016)   Paranoid ([place])    "sometimes; I'm on RX for it" (04/10/2016)   Prolonged Q-T interval on ECG    Rectal bleeding 12/31/2015   Schizoaffective disorder, bipolar type ([place]) 04/05/2018   Seasonal allergies    Seborrheic keratoses 12/31/2013   Seizures ([place])    "don't know what kind; last one was ~ 1 yr ago" (04/10/2016)   Sinus bradycardia    Stroke [facility]) 1980s   denies residual on 04/10/2016   Thrush 09/19/2013   Type 2 diabetes mellitus ([place]) 10/18/2010    Past Surgical History:  Procedure  Laterality Date   CESAREAN SECTION  1997   COLONOSCOPY WITH PROPOFOL N/A 04/01/2021   Procedure: COLONOSCOPY WITH PROPOFOL;  Surgeon: Carol Ada, MD;  Location: [place];  Service: Gastroenterology;  Laterality: N/A;  Rectal bleeding with drop in hemoglobin to 7.2 g/dL   HERNIA REPAIR     IR CHOLANGIOGRAM EXISTING TUBE  07/20/2016   IR PERC CHOLECYSTOSTOMY  05/10/2016   IR RADIOLOGIST EVAL & MGMT  06/08/2016   IR RADIOLOGIST EVAL & MGMT  06/29/2016   IR SINUS/FIST TUBE CHK-NON GI  07/12/2016   RIGHT/LEFT HEART CATH AND CORONARY ANGIOGRAPHY N/A 06/19/2017   Procedure: RIGHT/LEFT HEART CATH AND CORONARY ANGIOGRAPHY;  Surgeon: Jolaine Artist, MD;  Location: [facility];  Service: Cardiovascular;  Laterality: N/A;   TIBIA IM NAIL INSERTION Right 07/12/2016   Procedure: INTRAMEDULLARY (IM) NAIL RIGHT TIBIA;  Surgeon: Leandrew Koyanagi, MD;  Location: [place];  Service: Orthopedics;  Laterality: Right;   UMBILICAL HERNIA REPAIR  ~ 1963   "that's why I don't have  a belly button"   VAGINAL HYSTERECTOMY     Family History:  Family History  Problem Relation Age of Onset   Cancer Mother        lung   Depression Mother    Cancer Father        prostate   Depression Sister    Anxiety disorder Sister    Schizophrenia Sister    Bipolar disorder Sister    Depression Sister    Depression Brother    Heart failure Other        cousin   Family Psychiatric  History:  Social History:  Social History   Substance and Sexual Activity  Alcohol Use No   Alcohol/week: 0.0 standard drinks of alcohol     Social History   Substance and Sexual Activity  Drug Use Not Currently   Types: Cocaine   Comment: 04/10/2016 "last used cocaine back in November 2017"    Social History   Socioeconomic History   Marital status: Widowed    Spouse name: Not on file   Number of children: 3   Years of education: 12th   Highest education level: 12th grade  Occupational History   Occupation: disabled     Comment: factory production  Tobacco Use   Smoking status: Former    Packs/day: 1.50    Years: 38.00    Total pack years: 57.00    Types: Cigarettes    Start date: 03/13/1977    Quit date: 04/10/2016    Years since quitting: 5.4    Passive exposure: Past   Smokeless tobacco: Never  Vaping Use   Vaping Use: Never used  Substance and Sexual Activity   Alcohol use: No    Alcohol/week: 0.0 standard drinks of alcohol   Drug use: Not Currently    Types: Cocaine    Comment: 04/10/2016 "last used cocaine back in November 2017"   Sexual activity: Not Currently    Birth control/protection: Surgical  Other Topics Concern   Not on file  Social History Narrative   Has 1 son, [name]   Lives with son and his boyfriend   Her house has ramps and handrails should she ever needs them.    Her mother lives down the street from her and is a good support person in addition to her son.   She drives herself, has private transportation.    Cocaine free since 02/24/16, smoke free since 04/10/16   Social Determinants of Health   Financial Resource Strain: Medium Risk (07/24/2021)   Overall Financial Resource Strain (CARDIA)    Difficulty of Paying Living Expenses: Somewhat hard  Food Insecurity: No Food Insecurity (09/14/2021)   Hunger Vital Sign    Worried About Running Out of Food in the Last Year: Never true    Ran Out of Food in the Last Year: Never true  Recent Concern: Food Insecurity - Food Insecurity Present (06/23/2021)   Hunger Vital Sign    Worried About Running Out of Food in the Last Year: Never true    Ran Out of Food in the Last Year: Sometimes true  Transportation Needs: Unmet Transportation Needs (09/14/2021)   PRAPARE - Hydrologist (Medical): Yes    Lack of Transportation (Non-Medical): Yes  Physical Activity: Inactive (07/24/2021)   Exercise Vital Sign    Days of Exercise per Week: 0 days    Minutes of Exercise per Session: 0 min  Stress: Stress Concern  Present (07/24/2021)   [employer]  of Occupational Health - Occupational Stress Questionnaire    Feeling of Stress : To some extent  Social Connections: Moderately Isolated (07/24/2021)   Social Connection and Isolation Panel [NHANES]    Frequency of Communication with Friends and Family: More than three times a week    Frequency of Social Gatherings with Friends and Family: More than three times a week    Attends Religious Services: 1 to 4 times per year    Active Member of [employer] or Organizations: No    Attends Archivist Meetings: Never    Marital Status: Widowed   Additional Social History:    Allergies:   Allergies  Allergen Reactions   Hydrocodone Shortness Of Breath   Latuda [Lurasidone Hcl] Anaphylaxis   Magnesium-Containing Compounds Anaphylaxis    Tolerated Ensure   Prednisone Anaphylaxis, Swelling and Other (See Comments)    Tongue swelling, lip swelling, throat swelling, per pt    Tramadol Anaphylaxis and Swelling   Codeine Nausea And Vomiting   Trazodone Other (See Comments)    paranoia   Topamax [Topiramate] Other (See Comments)    Increases paranoia   Sulfa Antibiotics Itching   Tape Rash    Labs:  Results for orders placed or performed during the hospital encounter of 09/19/21 (from the past 48 hour(s))  Basic metabolic panel     Status: Abnormal   Collection Time: 09/19/21  2:52 PM  Result Value Ref Range   Sodium 140 135 - 145 mmol/L   Potassium 2.9 (L) 3.5 - 5.1 mmol/L   Chloride 106 98 - 111 mmol/L   CO2 25 22 - 32 mmol/L   Glucose, Bld 89 70 - 99 mg/dL    Comment: Glucose reference range applies only to samples taken after fasting for at least 8 hours.   BUN 14 8 - 23 mg/dL   Creatinine, Ser 1.28 (H) 0.44 - 1.00 mg/dL   Calcium 9.2 8.9 - 10.3 mg/dL   GFR, Estimated 47 (L) >60 mL/min    Comment: (NOTE) Calculated using the CKD-EPI Creatinine Equation (2021)    Anion gap 9 5 - 15    Comment: Performed at [facility], [address] [place], [place] [address]  CBC     Status: Abnormal   Collection Time: 09/19/21  2:52 PM  Result Value Ref Range   WBC 6.0 4.0 - 10.5 K/uL   RBC 4.00 3.87 - 5.11 MIL/uL   Hemoglobin 10.6 (L) 12.0 - 15.0 g/dL   HCT 34.7 (L) 36.0 - 46.0 %   MCV 86.8 80.0 - 100.0 fL   MCH 26.5 26.0 - 34.0 pg   MCHC 30.5 30.0 - 36.0 g/dL   RDW 17.6 (H) 11.5 - 15.5 %   Platelets 439 (H) 150 - 400 K/uL   nRBC 0.0 0.0 - 0.2 %    Comment: Performed at [facility], [address], [place], [place] [address]  Troponin I (High Sensitivity)     Status: None   Collection Time: 09/19/21  2:52 PM  Result Value Ref Range   Troponin I (High Sensitivity) 8 <18 ng/L    Comment: (NOTE) Elevated high sensitivity troponin I (hsTnI) values and significant  changes across serial measurements may suggest ACS but many other  chronic and acute conditions are known to elevate hsTnI results.  Refer to the "Links" section for chest pain algorithms and additional  guidance. Performed at [facility], [address], [place], [place] [address]   Hepatic function  panel     Status: Abnormal   Collection Time: 09/19/21  2:52 PM  Result Value Ref Range   Total Protein 6.9 6.5 - 8.1 g/dL   Albumin 3.5 3.5 - 5.0 g/dL   AST 25 15 - 41 U/L   ALT 15 0 - 44 U/L   Alkaline Phosphatase 152 (H) 38 - 126 U/L   Total Bilirubin 0.8 0.3 - 1.2 mg/dL   Bilirubin, Direct 0.1 0.0 - 0.2 mg/dL   Indirect Bilirubin 0.7 0.3 - 0.9 mg/dL    Comment: Performed at [facility], [address] [place], [place] [address]  Urine Drug Screen, Qualitative     Status: Abnormal   Collection Time: 09/19/21  7:10 PM  Result Value Ref Range   Tricyclic, Ur Screen POSITIVE (A) NONE DETECTED   Amphetamines, Ur Screen NONE DETECTED NONE DETECTED   MDMA (Ecstasy)Ur Screen NONE DETECTED NONE DETECTED   Cocaine Metabolite,Ur [place] NONE DETECTED NONE DETECTED   Opiate, Ur Screen NONE DETECTED NONE DETECTED    Phencyclidine (PCP) Ur S NONE DETECTED NONE DETECTED   Cannabinoid 50 Ng, Ur [place] NONE DETECTED NONE DETECTED   Barbiturates, Ur Screen NONE DETECTED NONE DETECTED   Benzodiazepine, Ur Scrn POSITIVE (A) NONE DETECTED   Methadone Scn, Ur NONE DETECTED NONE DETECTED    Comment: (NOTE) Tricyclics + metabolites, urine    Cutoff 1000 ng/mL Amphetamines + metabolites, urine  Cutoff 1000 ng/mL MDMA (Ecstasy), urine              Cutoff 500 ng/mL Cocaine Metabolite, urine          Cutoff 300 ng/mL Opiate + metabolites, urine        Cutoff 300 ng/mL Phencyclidine (PCP), urine         Cutoff 25 ng/mL Cannabinoid, urine                 Cutoff 50 ng/mL Barbiturates + metabolites, urine  Cutoff 200 ng/mL Benzodiazepine, urine              Cutoff 200 ng/mL Methadone, urine                   Cutoff 300 ng/mL  The urine drug screen provides only a preliminary, unconfirmed analytical test result and should not be used for non-medical purposes. Clinical consideration and professional judgment should be applied to any positive drug screen result due to possible interfering substances. A more specific alternate chemical method must be used in order to obtain a confirmed analytical result. Gas chromatography / mass spectrometry (GC/MS) is the preferred confirm atory method. Performed at [facility], [address] [place], [place] [address]   Resp Panel by RT-PCR (Flu A&B, Covid)     Status: None   Collection Time: 09/19/21  7:10 PM   Specimen: Nasal Swab  Result Value Ref Range   SARS Coronavirus 2 by RT PCR NEGATIVE NEGATIVE    Comment: (NOTE) SARS-CoV-2 target nucleic acids are NOT DETECTED.  The SARS-CoV-2 RNA is generally detectable in upper respiratory specimens during the acute phase of infection. The lowest concentration of SARS-CoV-2 viral copies this assay can detect is 138 copies/mL. A negative result does not preclude SARS-Cov-2 infection and should not be used as the sole  basis for treatment or other patient management decisions. A negative result may occur with  improper specimen collection/handling, submission of specimen other than nasopharyngeal swab, presence of viral mutation(s) within the areas targeted by this assay, and  inadequate number of viral copies(<138 copies/mL). A negative result must be combined with clinical observations, patient history, and epidemiological information. The expected result is Negative.  Fact Sheet for Patients:  EntrepreneurPulse.com.au  Fact Sheet for Healthcare Providers:  IncredibleEmployment.be  This test is no t yet approved or cleared by the [place] FDA and  has been authorized for detection and/or diagnosis of SARS-CoV-2 by FDA under an Emergency Use Authorization (EUA). This EUA will remain  in effect (meaning this test can be used) for the duration of the COVID-19 declaration under Section 564(b)(1) of the Act, 21 U.S.C.section 360bbb-3(b)(1), unless the authorization is terminated  or revoked sooner.       Influenza A by PCR NEGATIVE NEGATIVE   Influenza B by PCR NEGATIVE NEGATIVE    Comment: (NOTE) The Xpert Xpress SARS-CoV-2/FLU/RSV plus assay is intended as an aid in the diagnosis of influenza from Nasopharyngeal swab specimens and should not be used as a sole basis for treatment. Nasal washings and aspirates are unacceptable for Xpert Xpress SARS-CoV-2/FLU/RSV testing.  Fact Sheet for Patients: EntrepreneurPulse.com.au  Fact Sheet for Healthcare Providers: IncredibleEmployment.be  This test is not yet approved or cleared by the [place] FDA and has been authorized for detection and/or diagnosis of SARS-CoV-2 by FDA under an Emergency Use Authorization (EUA). This EUA will remain in effect (meaning this test can be used) for the duration of the COVID-19 declaration under Section 564(b)(1) of the Act, 21  U.S.C. section 360bbb-3(b)(1), unless the authorization is terminated or revoked.  Performed at [facility], [address] [place], [place] [address]   Salicylate level     Status: Abnormal   Collection Time: 09/19/21  7:29 PM  Result Value Ref Range   Salicylate Lvl <3.6 (L) 7.0 - 30.0 mg/dL    Comment: Performed at [facility], [address] [place], [place] [address]  Acetaminophen level     Status: None   Collection Time: 09/19/21  7:29 PM  Result Value Ref Range   Acetaminophen (Tylenol), Serum 11 10 - 30 ug/mL    Comment: (NOTE) Therapeutic concentrations vary significantly. A range of 10-30 ug/mL  may be an effective concentration for many patients. However, some  are best treated at concentrations outside of this range. Acetaminophen concentrations >150 ug/mL at 4 hours after ingestion  and >50 ug/mL at 12 hours after ingestion are often associated with  toxic reactions.  Performed at [facility], [address] [place], [place] [address]    *Note: Due to a large number of results and/or encounters for the requested time period, some results have not been displayed. A complete set of results can be found in Results Review.    Current Facility-Administered Medications  Medication Dose Route Frequency Provider Last Rate Last Admin   acetaminophen (TYLENOL) tablet 650 mg  650 mg Oral Q6H PRN Harvest Dark, MD   650 mg at 09/21/21 1034   albuterol (PROVENTIL) (2.5 MG/3ML) 0.083% nebulizer solution 3 mL  3 mL Inhalation Q6H PRN Carrie Mew, MD       atorvastatin (LIPITOR) tablet 20 mg  20 mg Oral Daily Carrie Mew, MD   20 mg at 09/21/21 1019   carvedilol (COREG) tablet 6.25 mg  6.25 mg Oral BID WC Carrie Mew, MD   6.25 mg at 09/21/21 1019   cephALEXin (KEFLEX) capsule 500 mg  500 mg Oral TID Patrecia Pour, NP   500 mg at 09/21/21 1019   cyclobenzaprine (FLEXERIL) tablet 10  mg  10 mg Oral TID PRN Rada Hay, MD   10 mg at 09/20/21 1621   dicyclomine (BENTYL) capsule 10 mg  10 mg Oral TID Fawn Kirk, MD   10 mg at 09/21/21 1019   loperamide (IMODIUM) capsule 4 mg  4 mg Oral QID PRN Carrie Mew, MD       montelukast (SINGULAIR) tablet 10 mg  10 mg Oral QHS Carrie Mew, MD   10 mg at 09/20/21 2311   oxybutynin (DITROPAN) tablet 5 mg  5 mg Oral BID Carrie Mew, MD   5 mg at 09/21/21 1018   pantoprazole (PROTONIX) EC tablet 80 mg  80 mg Oral Daily Carrie Mew, MD   80 mg at 09/21/21 1019   torsemide (DEMADEX) tablet 20 mg  20 mg Oral Daily Carrie Mew, MD   20 mg at 09/21/21 1018   Current Outpatient Medications  Medication Sig Dispense Refill   albuterol (PROVENTIL) (2.5 MG/3ML) 0.083% nebulizer solution Take 2.5 mg by nebulization every 6 (six) hours as needed for wheezing or shortness of breath.     albuterol (VENTOLIN HFA) 108 (90 Base) MCG/ACT inhaler INHALE 2 PUFFS INTO THE LUNGS EVERY 6 (SIX) HOURS AS NEEDED FOR WHEEZING OR SHORTNESS OF BREATH. 8.5 g 2   amLODipine (NORVASC) 5 MG tablet TAKE 1 TABLET (5 MG TOTAL) BY MOUTH DAILY. 90 tablet 0   Asenapine Maleate 10 MG SUBL Place 1 tablet (10 mg total) under the tongue 2 (two) times daily. 10 tablet 0   atorvastatin (LIPITOR) 20 MG tablet Take 1 tablet (20 mg total) by mouth daily. 90 tablet 1   carvedilol (COREG) 6.25 MG tablet TAKE 1 TABLET(6.25 MG) BY MOUTH TWICE DAILY WITH A MEAL 180 tablet 1   colchicine 0.6 MG tablet TAKE 2 TABS (1.2 MG) ORALLY AT THE ONSET OF GOUT FLARE, MAY REPEAT 1 TAB (0.6 MG) 1 HOUR IF SYMPTOMS PERSIST 30 tablet 1   cyclobenzaprine (FLEXERIL) 10 MG tablet Take 10 mg by mouth 2 (two) times daily as needed.     diazepam (VALIUM) 5 MG tablet Take 1 tablet (5 mg total) by mouth daily as needed for anxiety. (Patient taking differently: Take 5 mg by mouth at bedtime.) 30 tablet 0   dicyclomine (BENTYL) 10 MG capsule Take 1 capsule (10 mg total) by mouth 3 (three) times daily before  meals. 90 capsule 6   escitalopram (LEXAPRO) 20 MG tablet Take 20 mg by mouth daily.     febuxostat (ULORIC) 40 MG tablet TAKE 1 TABLET (40 MG TOTAL) BY MOUTH DAILY. 30 tablet 3   Fluticasone-Umeclidin-Vilant (TRELEGY ELLIPTA) 100-62.5-25 MCG/ACT AEPB Inhale 1 puff into the lungs daily. 60 each 6   gabapentin (NEURONTIN) 800 MG tablet Take 800 mg by mouth daily.     Iron, Ferrous Sulfate, 325 (65 Fe) MG TABS Take 325 mg by mouth daily. 60 tablet 2   loperamide (IMODIUM) 2 MG capsule Take 2 capsules (4 mg total) by mouth 3 (three) times daily as needed for diarrhea or loose stools. 30 capsule 0   montelukast (SINGULAIR) 10 MG tablet TAKE 1 TABLET (10 MG TOTAL) BY MOUTH AT BEDTIME. 30 tablet 2   OLANZapine (ZYPREXA) 5 MG tablet Take 5 mg by mouth daily.     omeprazole (PRILOSEC) 40 MG capsule Take 1 capsule (40 mg total) by mouth every morning. 30 capsule 6   oxybutynin (DITROPAN) 5 MG tablet Take 1 tablet (5 mg total) by mouth 2 (two) times  daily. 60 tablet 5   potassium chloride SA (KLOR-CON M) 20 MEQ tablet Take 40 mEq by mouth 2 (two) times daily.     prazosin (MINIPRESS) 2 MG capsule Take 1 capsule (2 mg total) by mouth at bedtime. TAKE 1 CAPSULE(2 MG) BY MOUTH AT BEDTIME Strength: 2 mg (Patient taking differently: Take 2 mg by mouth at bedtime.) 30 capsule 6   terbinafine (LAMISIL AT) 1 % cream Apply 1 application  topically 2 (two) times daily. 30 g 1   torsemide (DEMADEX) 20 MG tablet TAKE 1 TABLET(20 MG) BY MOUTH DAILY 90 tablet 1   amLODipine (NORVASC) 10 MG tablet Take 10 mg by mouth daily. (Patient not taking: Reported on 09/20/2021)     diclofenac Sodium (VOLTAREN) 1 % GEL Apply 4 g topically 4 (four) times daily as needed (pain). (Patient not taking: Reported on 09/20/2021) 100 g 3   methocarbamol (ROBAXIN) 500 MG tablet TAKE 1 TABLET(500 MG) BY MOUTH TWICE DAILY (Patient not taking: Reported on 09/20/2021) 30 tablet 1   ondansetron (ZOFRAN-ODT) 4 MG disintegrating tablet Take 1 tablet (4 mg  total) by mouth every 4 (four) hours as needed for nausea or vomiting. (Patient not taking: Reported on 09/20/2021) 20 tablet 0   pregabalin (LYRICA) 75 MG capsule Take 1 capsule (75 mg total) by mouth 2 (two) times daily. 60 capsule 0    Musculoskeletal: Strength & Muscle Tone: decreased Gait & Station: unsteady Patient leans: Backward  Psychiatric Specialty Exam: Physical Exam Vitals and nursing note reviewed.  HENT:     Head: Normocephalic and atraumatic.     Nose: Nose normal.  Pulmonary:     Effort: Pulmonary effort is normal.  Musculoskeletal:        General: Normal range of motion.     Cervical back: Normal range of motion.  Neurological:     Mental Status: [name] alert and oriented to person, place, and time.     Gait: Gait abnormal.  Psychiatric:        Attention and Perception: Attention and perception normal.        Mood and Affect: Mood is anxious. Affect is blunt.        Speech: Speech normal.        Behavior: Behavior normal. Behavior is cooperative.        Thought Content: Thought content normal.        Cognition and Memory: Cognition and memory normal.        Judgment: Judgment normal.     Review of Systems  Psychiatric/Behavioral:  The patient is nervous/anxious.   All other systems reviewed and are negative.   Blood pressure (!) 140/100, pulse 84, temperature 98.5 F (36.9 C), temperature source Oral, resp. rate 17, height '5\' 4"'$  (1.626 m), weight 74.8 kg, SpO2 98 %.Body mass index is 28.31 kg/m.  General Appearance: Casual  Eye Contact:  Good  Speech:  Normal Rate  Volume:  Normal  Mood:  Anxious  Affect:  Blunt  Thought Process:  Coherent  Orientation:  Full (Time, Place, and Person)  Thought Content:  WDL and Logical  Suicidal Thoughts:  No  Homicidal Thoughts:  No  Memory:  Immediate;   Good Recent;   Good Remote;   Good  Judgement:  Fair  Insight:  Fair  Psychomotor Activity:  Normal  Concentration:  Concentration: Good and Attention Span:  Good  Recall:  Good  Fund of Knowledge:  Good  Language:  Good  Akathisia:  No  Handed:  Right  AIMS (if indicated):     Assets:  Housing Leisure Time Physical Health Resilience Social Support  ADL's:  Intact  Cognition:  WNL  Sleep:         Physical Exam: Physical Exam Vitals and nursing note reviewed.  HENT:     Head: Normocephalic and atraumatic.     Nose: Nose normal.  Pulmonary:     Effort: Pulmonary effort is normal.  Musculoskeletal:        General: Normal range of motion.     Cervical back: Normal range of motion.  Neurological:     Mental Status: [name] alert and oriented to person, place, and time.     Gait: Gait abnormal.  Psychiatric:        Attention and Perception: Attention and perception normal.        Mood and Affect: Mood is anxious. Affect is blunt.        Speech: Speech normal.        Behavior: Behavior normal. Behavior is cooperative.        Thought Content: Thought content normal.        Cognition and Memory: Cognition and memory normal.        Judgment: Judgment normal.    Review of Systems  Psychiatric/Behavioral:  The patient is nervous/anxious.   All other systems reviewed and are negative.  Blood pressure (!) 155/107, pulse 90, temperature 98.6 F (37 C), temperature source Oral, resp. rate 18, height '5\' 4"'$  (1.626 m), weight 74.8 kg, SpO2 94 %. Body mass index is 28.31 kg/m.  Treatment Plan Summary: Schizoaffective disorder, bipolar type: No medication changes, continue current regiment and follow up with her psychiatrist  Disposition: Discharge home  Waylan Boga, NP 09/21/2021 1:38 PM

## 2021-09-21 NOTE — ED Notes (Signed)
IVC/Pending Placement 

## 2021-09-21 NOTE — ED Notes (Signed)
Patient refuses to verify name and DOB for lidocaine and carafate administration. Patient uncooperative with care.

## 2021-09-21 NOTE — ED Notes (Signed)
Pt calm and cooperative; denies SI and plan to hurt self or others; pt states is finished with breakfast tray; pt inquired when lunch tray will arrive; pt updated; pt requested to have light turned out so she can sleep and requested to be woken when lunch tray arrives. Lights dimmed at pt's request. Pt's resp reg/unlabored, skin dry and calmly laying on stretcher.

## 2021-09-21 NOTE — ED Notes (Signed)
Pt currently refusing to sign for d/c education and paperwork until her wallet is in her hand. Pt requesting a soda.

## 2021-09-21 NOTE — ED Notes (Addendum)
This RN to bedside; pt confirms her ride is here. Pt now requesting an order for a walker for home. Educated pt. EDP Archie Balboa notified. No new orders. Explained to pt to bring this to attention of PCP tomorrow.

## 2021-09-21 NOTE — ED Notes (Signed)
Security found pt's key and is checking locker for pt's wallet; pt cannot use personal phone which has ride's number until she can open her purse which has lock on it; pt has hospital phone but doesn't know ride's number by heart.

## 2021-09-21 NOTE — ED Notes (Signed)
Pt given paper scrubs as requested as states doesn't have anything to go home in. Pt reports has a wallet locked up with security; security staff in [place] notified. Pt given hospital phone to get in contact with ride.

## 2021-09-21 NOTE — ED Notes (Addendum)
Pt requesting PT/OT outpatient. EDP Archie Balboa and [place] notified via secure chat. Security looking for pt's wallet she states was not left in her belongings bag.

## 2021-09-21 NOTE — ED Notes (Signed)
Pt unable to sign for d/c paperwork as topaz currently malfunctioning. Pt wheeled out to front of ER outside in specific spot she requested.

## 2021-09-21 NOTE — ED Notes (Addendum)
Patient bedding wet. Patient's bed changed and patient cleaned. Patient refusing brief at this time. Breakfast tray given at this time.

## 2021-09-21 NOTE — ED Notes (Signed)
Security states unable to find wallet; pt refuses to let this RN search her belongings bags with her to see if wallet in one of them.

## 2021-09-21 NOTE — ED Provider Notes (Signed)
-----------------------------------------   1:40 PM on 09/21/2021 ----------------------------------------- Has been seen and evaluated by psychiatry.  They believe the patient is safe for discharge home from psychiatric standpoint.  Patient's medical work-up has been nonrevealing.   Harvest Dark, MD 09/21/21 1340

## 2021-09-21 NOTE — ED Notes (Signed)
Pt given soda. Pt given her envelope dropped off by security personnel. Pt reviewing things in envelope currently.

## 2021-09-21 NOTE — ED Notes (Signed)
Pt now declining outpatient PT/OT stating doesn't have time to wait for consult to be completed. States will try and have PCP do this.

## 2021-09-21 NOTE — Discharge Instructions (Signed)
You have been seen in the emergency department for a  psychiatric concern. You have been evaluated both medically as well as psychiatrically. Please follow-up with your outpatient resources provided. Return to the emergency department for any worsening symptoms, or any thoughts of hurting yourself or anyone else so that we may attempt to help you. 

## 2021-09-21 NOTE — ED Notes (Signed)
Pt states doesn't think she'll need EMS transport home. States will try to call a ride using her phone. Pt given her belongings.

## 2021-09-23 DIAGNOSIS — F3131 Bipolar disorder, current episode depressed, mild: Secondary | ICD-10-CM | POA: Insufficient documentation

## 2021-09-26 ENCOUNTER — Telehealth: Payer: Self-pay

## 2021-09-26 DIAGNOSIS — J42 Unspecified chronic bronchitis: Secondary | ICD-10-CM

## 2021-09-26 DIAGNOSIS — I10 Essential (primary) hypertension: Secondary | ICD-10-CM

## 2021-09-26 DIAGNOSIS — I5032 Chronic diastolic (congestive) heart failure: Secondary | ICD-10-CM

## 2021-09-27 ENCOUNTER — Telehealth: Payer: Self-pay | Admitting: *Deleted

## 2021-09-27 DIAGNOSIS — M4626 Osteomyelitis of vertebra, lumbar region: Secondary | ICD-10-CM | POA: Insufficient documentation

## 2021-09-27 NOTE — Chronic Care Management (AMB) (Signed)
  Care Coordination  Outreach Note  09/27/2021 Name: Tiffany [name] MRN: [id number] DOB: 09-11-1960   Care Coordination Outreach Attempts: An unsuccessful telephone outreach was attempted today to offer the patient information about available care coordination services as a benefit of their health plan.   Follow Up Plan:  Additional outreach attempts will be made to offer the patient care coordination information and services.   Encounter Outcome:  No Answer   [place]  Direct Dial: [phone]

## 2021-09-28 NOTE — Chronic Care Management (AMB) (Signed)
  Care Coordination  Outreach Note  09/28/2021 Name: ZULLY FRANE MRN: [id number] DOB: [birth year]   Care Coordination Outreach Attempts: A second unsuccessful outreach was attempted today to offer the patient with information about available care coordination services as a benefit of their health plan.     Follow Up Plan:  Additional outreach attempts will be made to offer the patient care coordination information and services.   Encounter Outcome:  No Answer  [place]  Direct Dial: [phone]

## 2021-09-29 NOTE — Chronic Care Management (AMB) (Signed)
  Care Coordination  Outreach Note  09/29/2021 Name: Tiffany [name] MRN: [id number] DOB: [birth year]   Care Coordination Outreach Attempts: A third unsuccessful outreach was attempted today to offer the patient with information about available care coordination services as a benefit of their health plan.   Follow Up Plan:  No further outreach attempts will be made at this time. We have been unable to contact the patient to offer or enroll patient in care coordination services  Encounter Outcome:  No Answer   [place]: [phone]

## 2021-10-01 IMAGING — MR MR HEAD W/O CM
10 of 11 series · 43 of 48 positions shown · non-contrast
Comparison: Head CT 10/19/2018

Brain MRI 01/06/2013

CLINICAL DATA: Fall. Left-sided facial droop and slurred speech.

EXAM:
MRI HEAD WITHOUT CONTRAST
TECHNIQUE: Multiplanar, multiecho pulse sequences of the brain and surrounding
structures were obtained without intravenous contrast.

[Series 5: DWI · axial · 3.0mm · 0.88mm/px · z∈[-135,-3]mm · 9 of 92 slices shown (1 of 4)]
[im 1/92]
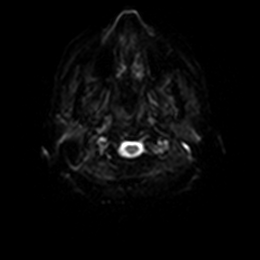
[im 12/92]
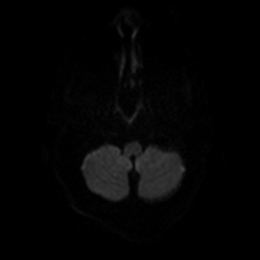
[im 23/92]
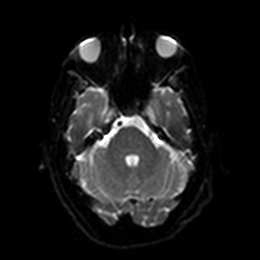
[im 35/92]
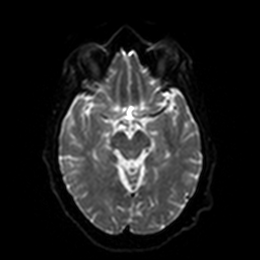
[im 46/92]
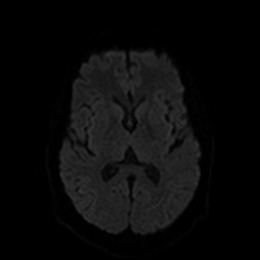
[im 57/92]
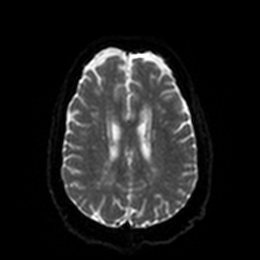
[im 69/92]
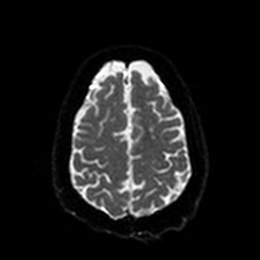
[im 80/92]
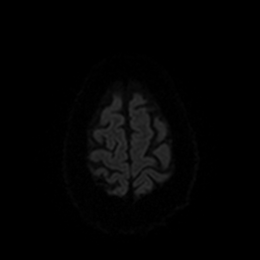
[im 92/92]
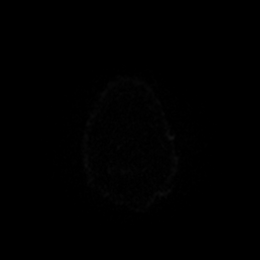

[Series 6: DWI · axial · 3.0mm · 0.88mm/px · z∈[-135,-3]mm · 5 of 46 slices shown (2 of 4)]
[im 1/46]
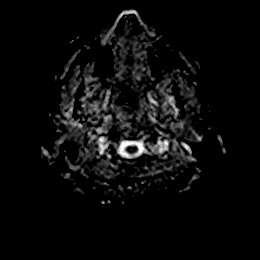
[im 12/46]
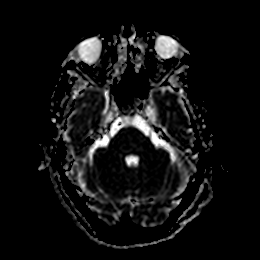
[im 23/46]
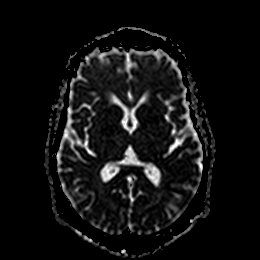
[im 34/46]
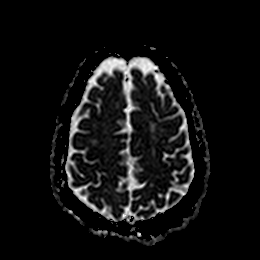
[im 46/46]
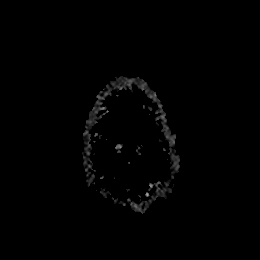

[Series 7: DWI · coronal · 4.0mm · 0.88mm/px · 7 of 68 slices shown (3 of 4)]
[im 1/68]
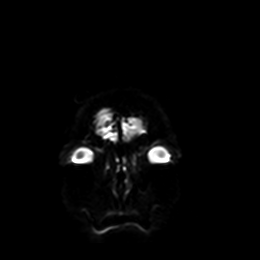
[im 12/68]
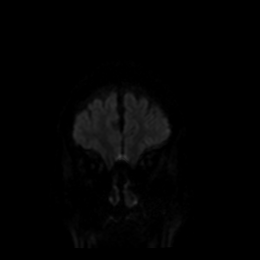
[im 23/68]
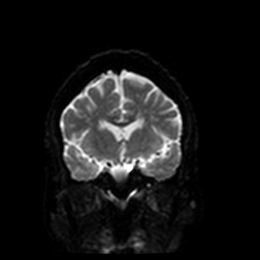
[im 34/68]
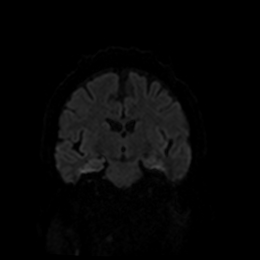
[im 45/68]
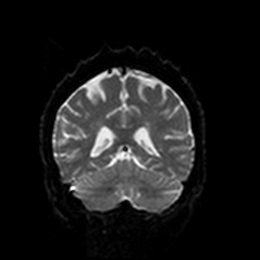
[im 56/68]
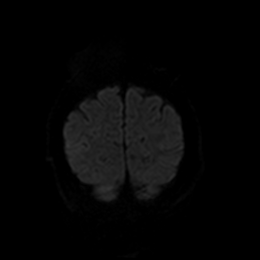
[im 68/68]
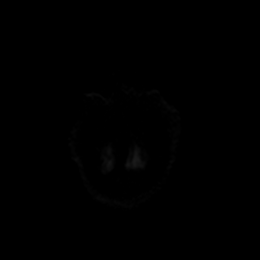

[Series 8: DWI · coronal · 4.0mm · 0.88mm/px · 3 of 34 slices shown (4 of 4)]
[im 1/34]
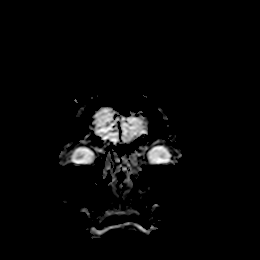
[im 17/34]
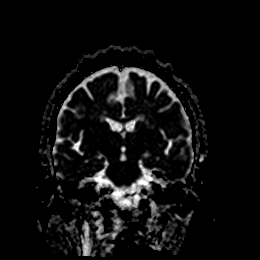
[im 34/34]
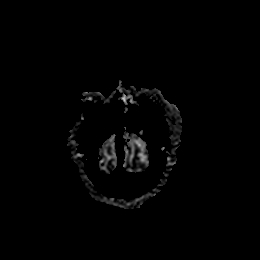

[Series 9: T1 · sagittal · 5.0mm · 0.75mm/px · 2 of 25 slices shown]
[im 1/25]
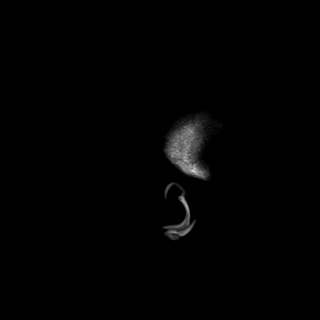
[im 25/25]
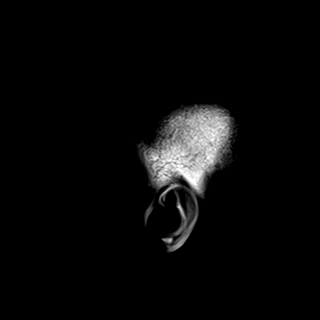

[Series 10: T2 · axial · 5.0mm · 0.72mm/px · z∈[-140,+1]mm · 2 of 25 slices shown (1 of 2)]
[im 1/25]
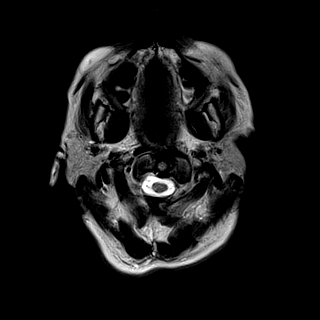
[im 25/25]
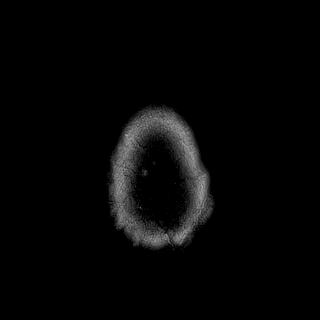

[Series 11: FLAIR · axial · 5.0mm · 0.45mm/px · z∈[-142,-1]mm · 2 of 25 slices shown]
[im 1/25]
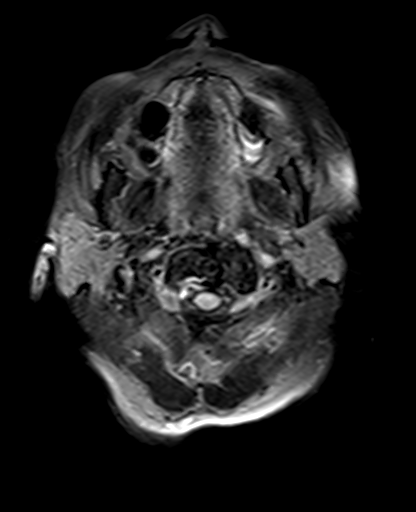
[im 25/25]
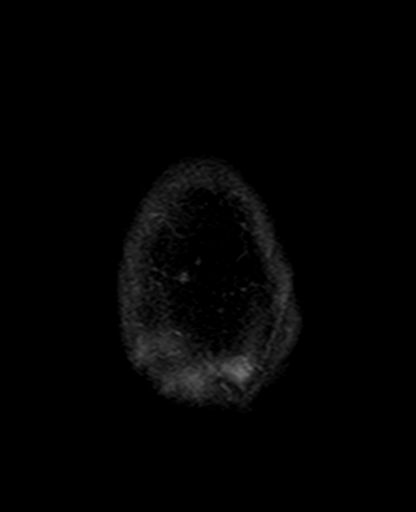

[Series 13: pha_images · axial · 3.0mm · 0.90mm/px · z∈[-155,+16]mm · 5 of 58 slices shown]
[im 1/58]
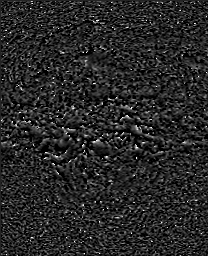
[im 15/58]
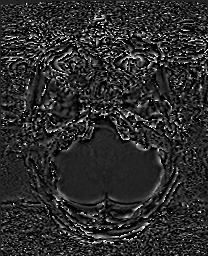
[im 29/58]
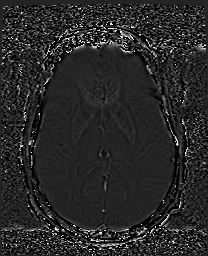
[im 43/58]
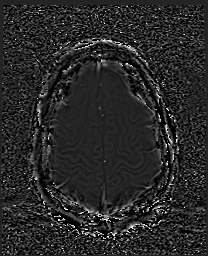
[im 58/58]
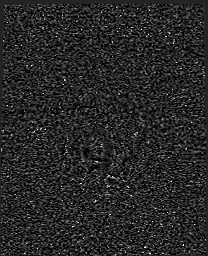

[Series 14: swi_images · axial · 3.0mm · 0.90mm/px · z∈[-158,+16]mm · 5 of 60 slices shown]
[im 1/60]
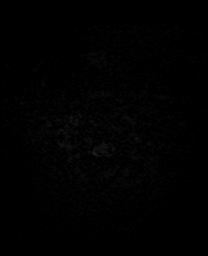
[im 15/60]
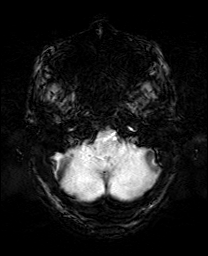
[im 30/60]
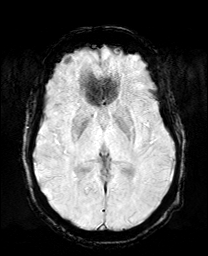
[im 45/60]
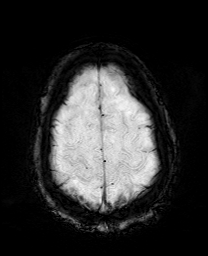
[im 60/60]
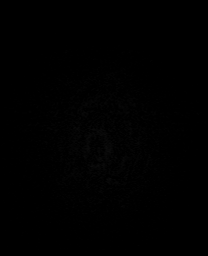

[Series 17: T2 · coronal · 5.0mm · 0.34mm/px · 3 of 31 slices shown (2 of 2)]
[im 1/31]
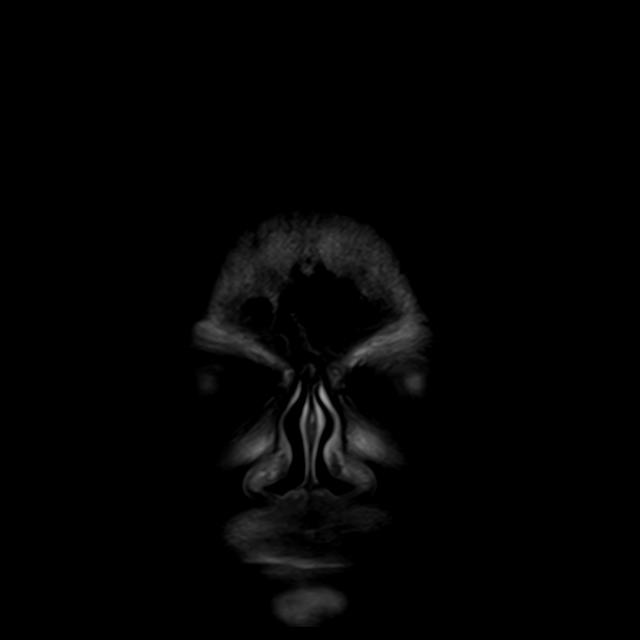
[im 16/31]
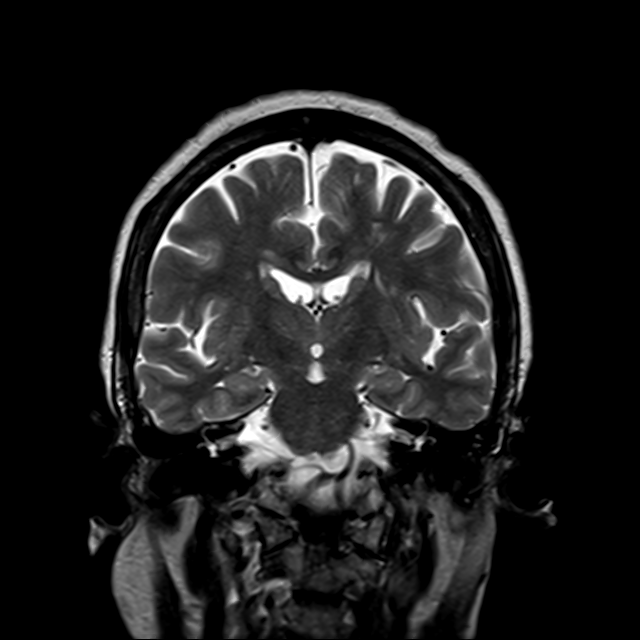
[im 31/31]
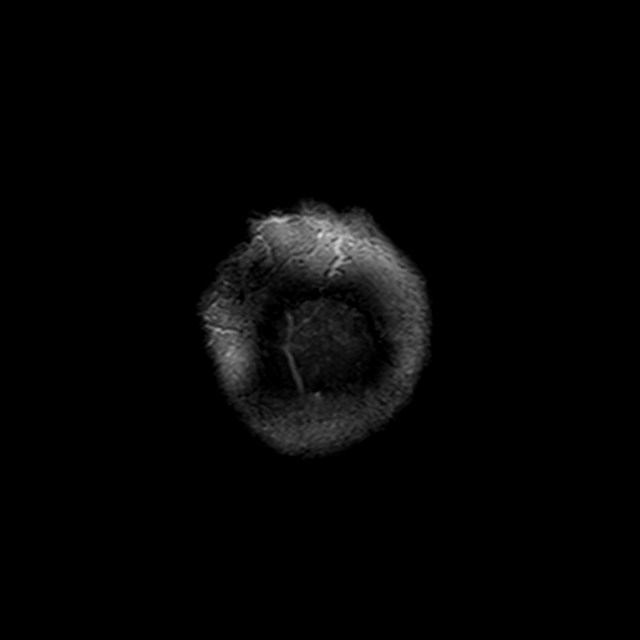

[43 of 48 positions shown; findings below may reference images not displayed]

FINDINGS: BRAIN: There is no acute infarct, acute hemorrhage or extra-axial
collection. Early confluent hyperintense T2-weighted signal of the
periventricular and deep white matter, most commonly due to chronic
ischemic microangiopathy. White matter disease has progressed from
the prior study. No age advanced or lobar predominant volume loss. A
partially empty sella is incidentally noted.

VASCULAR: The major intracranial arterial and venous sinus flow
voids are normal. Susceptibility-sensitive sequences show no chronic
microhemorrhage or superficial siderosis.

SKULL AND UPPER CERVICAL SPINE: Calvarial bone marrow signal is
normal. There is no skull base mass. The visualized upper cervical
spine and soft tissues are normal.

SINUSES/ORBITS: There are no fluid levels or advanced mucosal
thickening. The mastoid air cells and middle ear cavities are free
of fluid. The orbits are normal.
IMPRESSION: 1. No acute intracranial process.
2. Progression of white matter changes most commonly indicating
chronic small vessel disease.

## 2021-10-04 IMAGING — CR DG CHEST 2V
2 series · 2 of 2 positions shown · non-contrast
Comparison: 10/07/2018

CLINICAL DATA: Chills.

EXAM:
CHEST - 2 VIEW

[w chest lat]
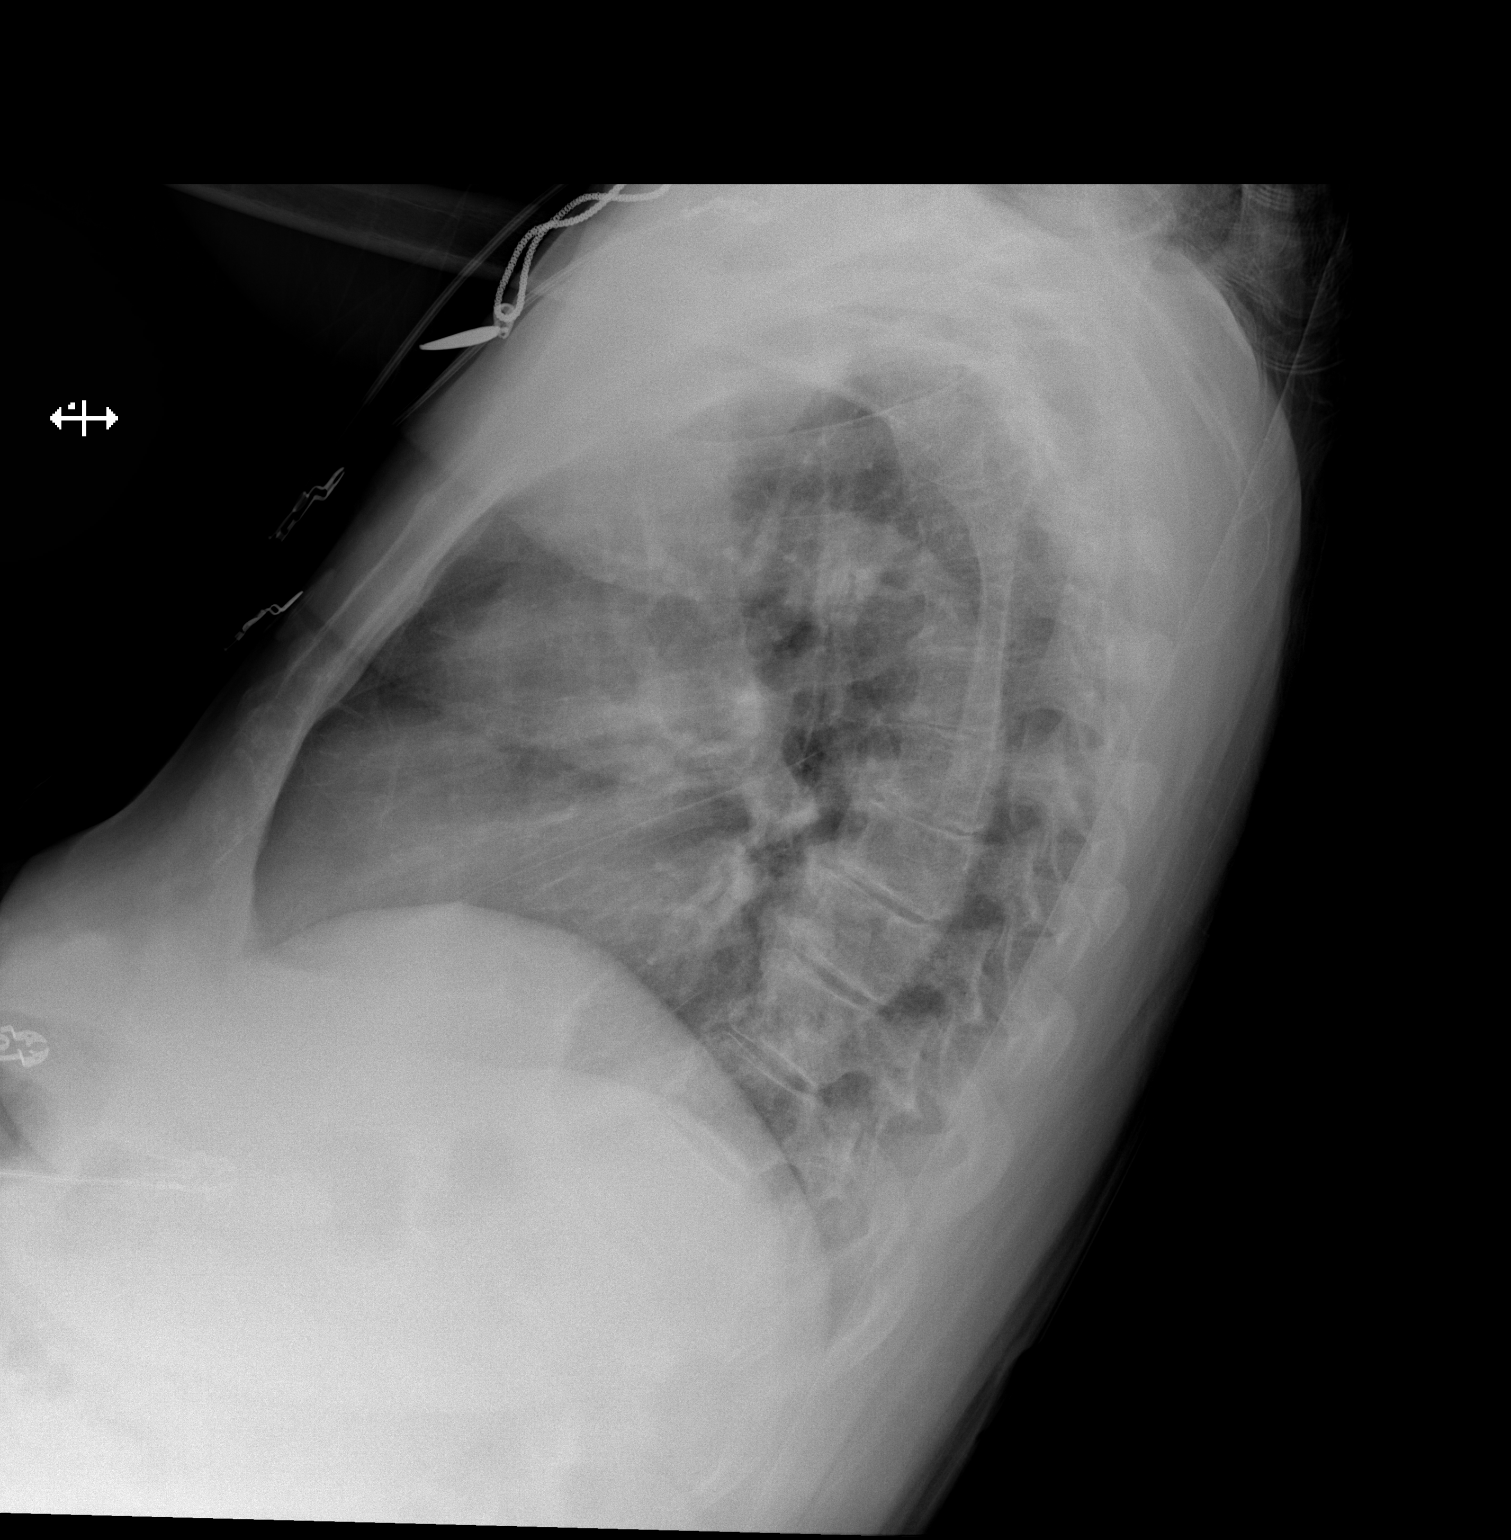

[x chest ap]
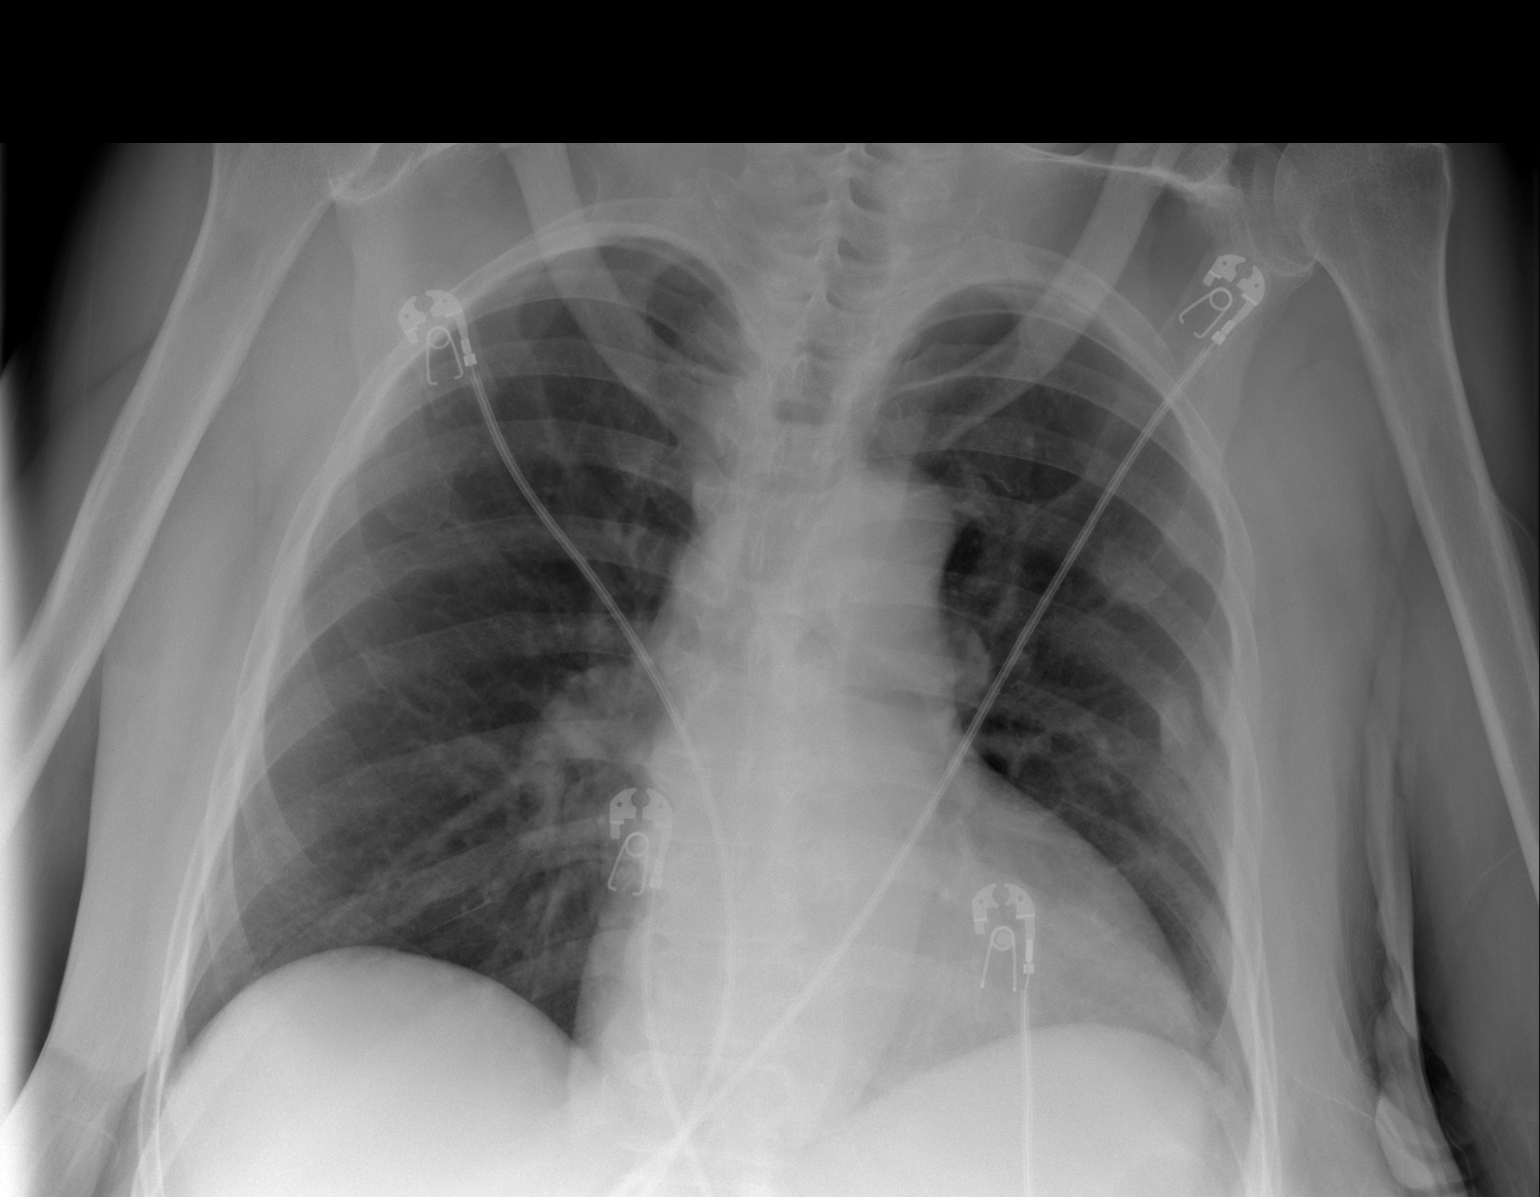

[2 of 2 positions shown; findings below may reference images not displayed]

FINDINGS: The heart size and mediastinal contours are within normal limits.
Both lungs are clear. Multiple old left rib fracture deformities
again noted.
IMPRESSION: No active cardiopulmonary disease.

## 2021-10-04 IMAGING — CT CT HEAD W/O CM
3 series · 16 of 47 positions shown, 19 images · non-contrast
Comparison: 10/19/2018

CLINICAL DATA: Unexplained altered level consciousness.

EXAM:
CT HEAD WITHOUT CONTRAST
TECHNIQUE: Contiguous axial images were obtained from the base of the skull
through the vertex without intravenous contrast.

[Series 2: head wo · axial · 0.43mm/px · z∈[+1379,+1514]mm · 10 of 33 slices shown, 13 images]
[im 3/33  brain]
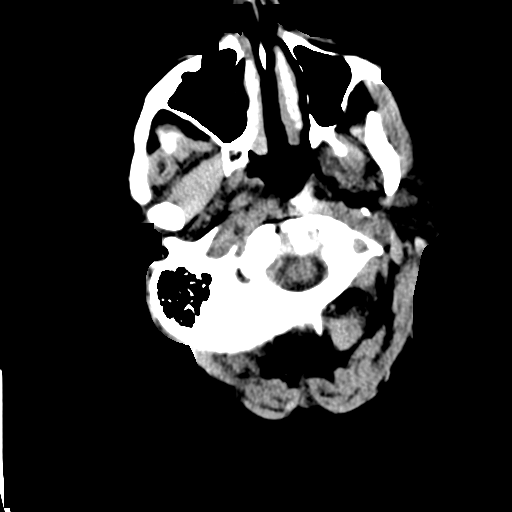
[im 3/33  bone]
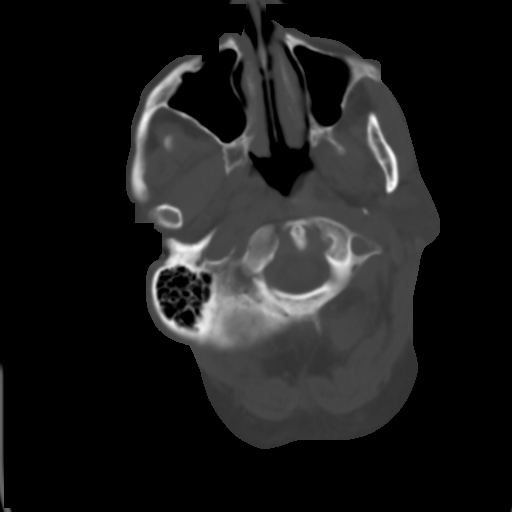
[im 6/33  brain]
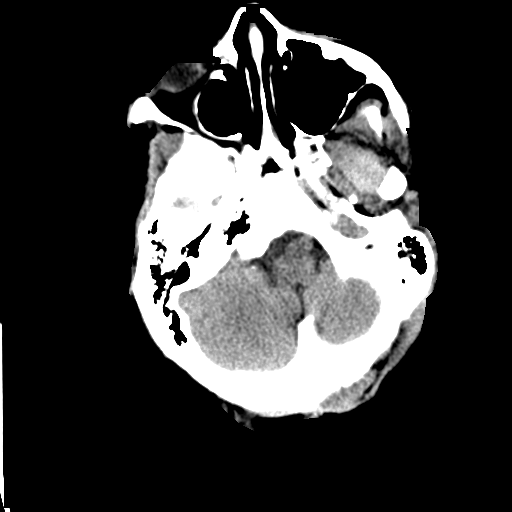
[im 9/33  brain]
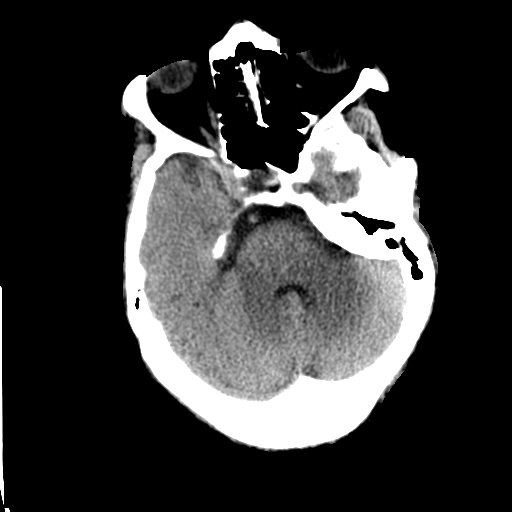
[im 12/33  brain]
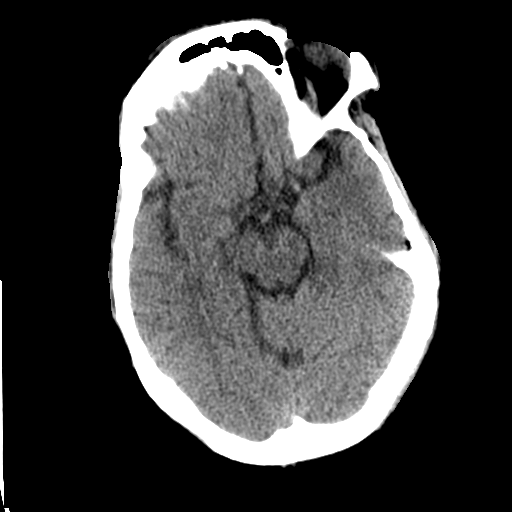
[im 15/33  brain]
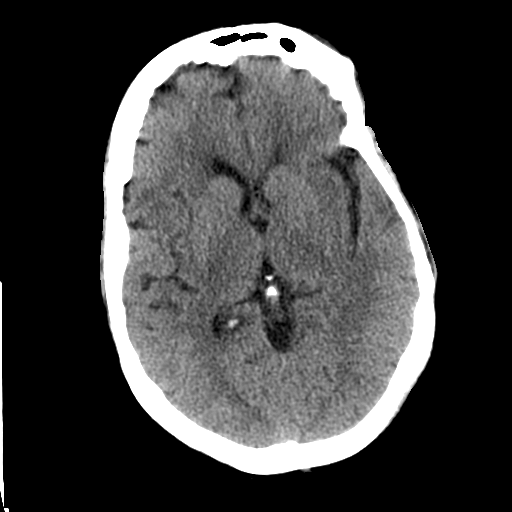
[im 15/33  bone]
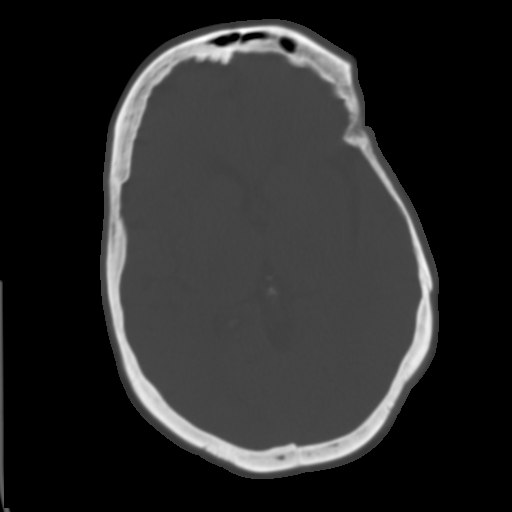
[im 18/33  brain]
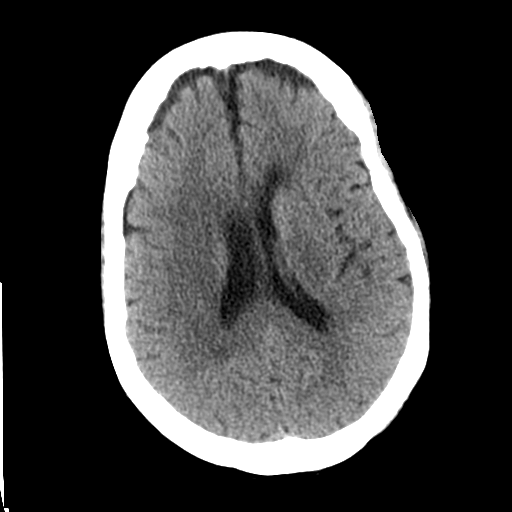
[im 21/33  brain]
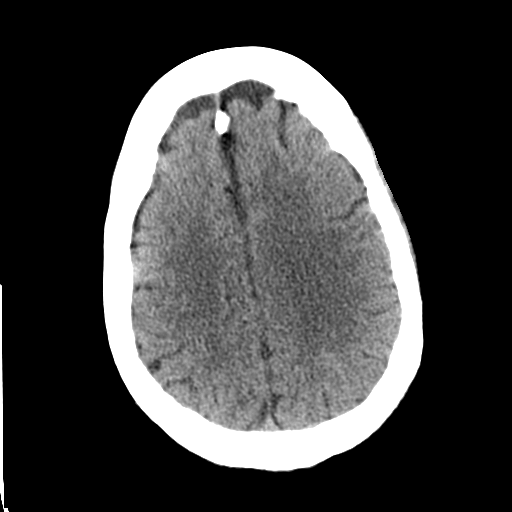
[im 25/33  brain]
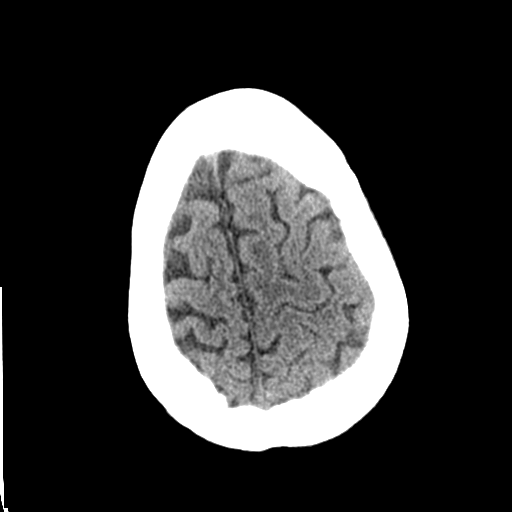
[im 27/33  brain]
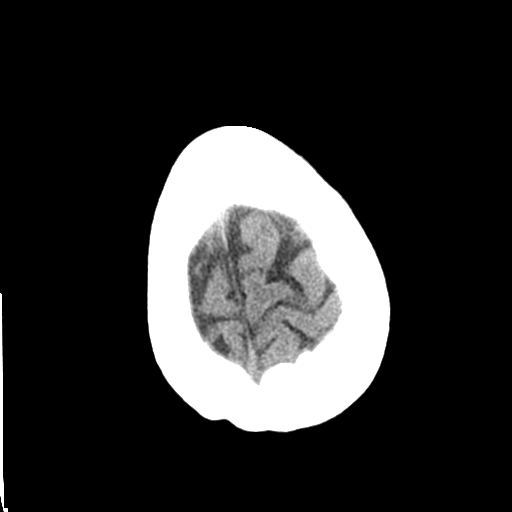
[im 27/33  bone]
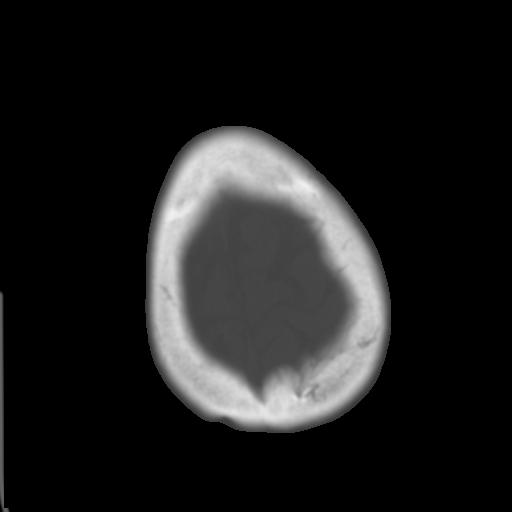
[im 30/33  brain]
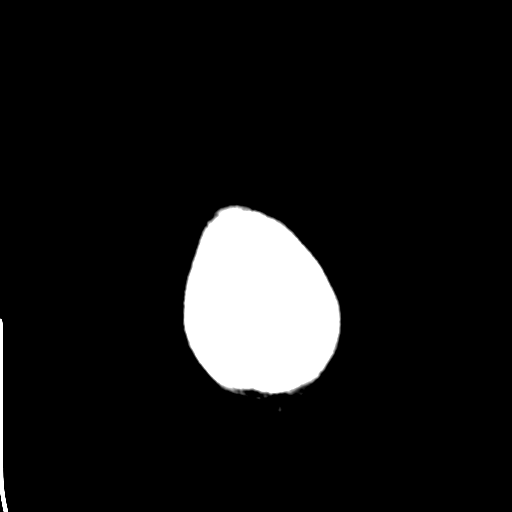

[Series 5: coronal soft tissue · coronal · 0.29mm/px · 3 of 68 slices shown]
[im 23/68  brain]
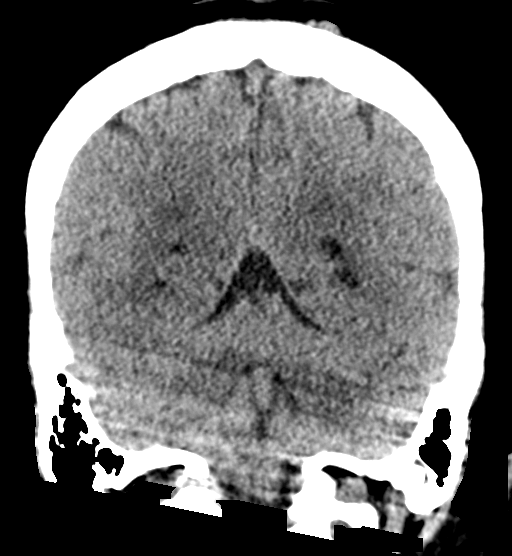
[im 30/68  brain]
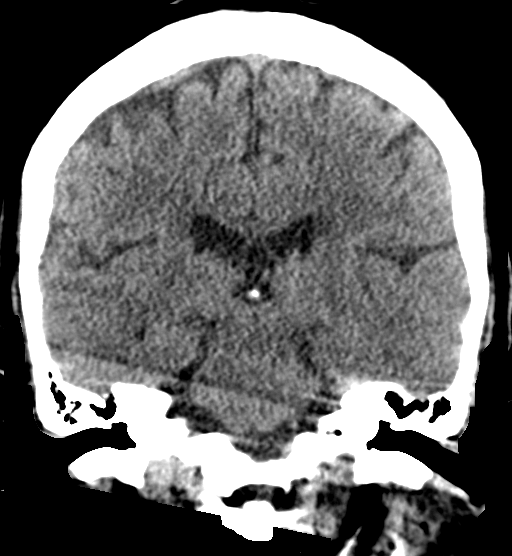
[im 38/68  brain]
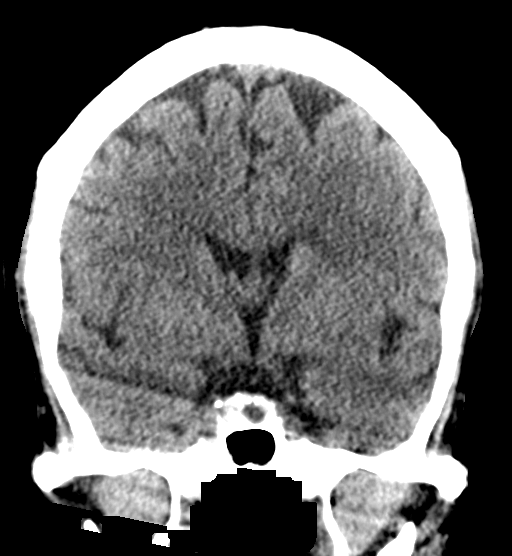

[Series 6: sagittal soft tissue · sagittal · 0.32mm/px · 3 of 51 slices shown]
[im 21/51  brain]
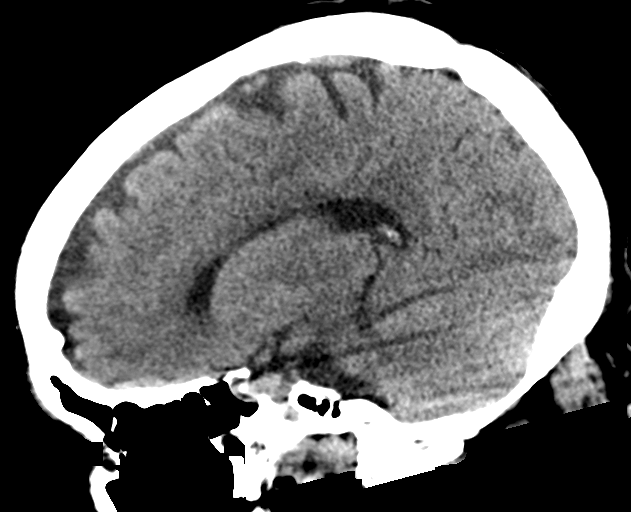
[im 26/51  brain]
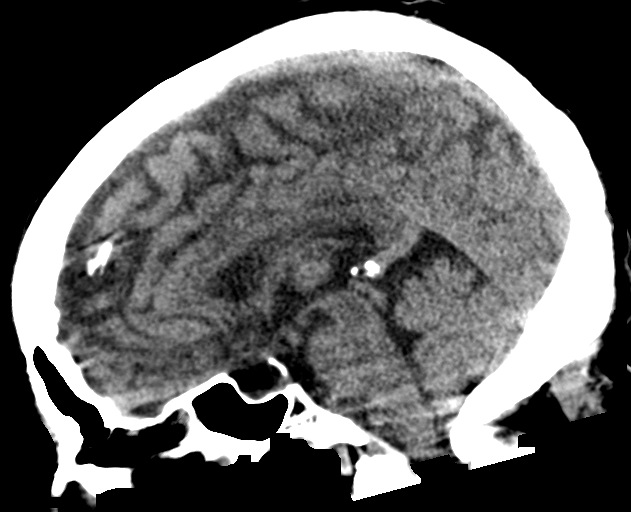
[im 31/51  brain]
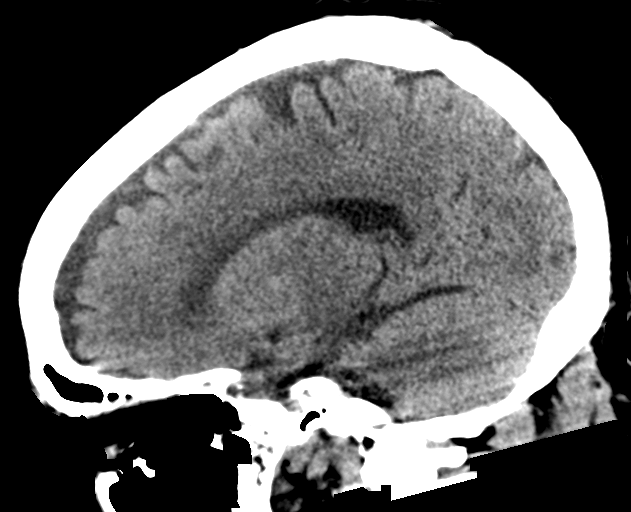

[16 of 47 positions shown; findings below may reference images not displayed]

FINDINGS: Brain: No evidence of acute infarction, hemorrhage, hydrocephalus,
extra-axial collection, or mass lesion/mass effect. Mild chronic
small vessel disease shows no significant change.

Vascular:  No hyperdense vessel or other acute findings.

Skull: No evidence of fracture or other significant bone
abnormality.

Sinuses/Orbits:  No acute findings.

Other: None.
IMPRESSION: No acute intracranial abnormality. Stable mild chronic small vessel
disease.

## 2021-10-19 ENCOUNTER — Encounter (HOSPITAL_COMMUNITY): Payer: Self-pay | Admitting: Registered Nurse

## 2021-10-19 ENCOUNTER — Ambulatory Visit (HOSPITAL_COMMUNITY)
Admission: EM | Admit: 2021-10-19 | Discharge: 2021-10-19 | Disposition: A | Discharge status: Home / Self Care | Payer: Medicare HMO | Attending: Registered Nurse | Admitting: Registered Nurse

## 2021-10-19 DIAGNOSIS — F250 Schizoaffective disorder, bipolar type: Secondary | ICD-10-CM | POA: Insufficient documentation

## 2021-10-19 DIAGNOSIS — F4310 Post-traumatic stress disorder, unspecified: Secondary | ICD-10-CM | POA: Diagnosis not present

## 2021-10-19 DIAGNOSIS — Z765 Malingerer [conscious simulation]: Secondary | ICD-10-CM | POA: Diagnosis not present

## 2021-10-19 MED ORDER — CARVEDILOL 3.125 MG PO TABS
6.2500 mg | ORAL_TABLET | Freq: Two times a day (BID) | ORAL | Status: DC
  Administered 2021-10-19: 6.25 mg via ORAL
  Filled 2021-10-19: qty 2

## 2021-10-19 MED ORDER — ATORVASTATIN CALCIUM 10 MG PO TABS
20.0000 mg | ORAL_TABLET | Freq: Every day | ORAL | Status: DC
  Administered 2021-10-19: 20 mg via ORAL
  Filled 2021-10-19: qty 2

## 2021-10-19 MED ORDER — GABAPENTIN 400 MG PO CAPS
800.0000 mg | ORAL_CAPSULE | Freq: Every day | ORAL | Status: DC
  Administered 2021-10-19: 800 mg via ORAL
  Filled 2021-10-19 (×2): qty 2

## 2021-10-19 MED ORDER — OLANZAPINE 5 MG PO TABS
5.0000 mg | ORAL_TABLET | Freq: Every day | ORAL | Status: DC
  Administered 2021-10-19: 5 mg via ORAL
  Filled 2021-10-19: qty 1

## 2021-10-19 NOTE — ED Notes (Signed)
Spoke with provider about pt being discharged home.  Pt reported she would most likely harm herself if she was to be discharged.  Provider made aware, along with medical director.  Plan is still to discharge home at this time via taxi.
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
## 2021-10-19 NOTE — ED Notes (Signed)
Pt c/o of SOB and chest tightness.  Very tearful when speaking with this Probation officer.  Vital signs were taken and provided to provider.  While in room it was noted that pt had removed shoe laces from boots and placed them loosely around neck.  This writer was able to remove shoe laces from neck without issue and has them secured at nurses station.  Will continue to monitor for safety.

## 2021-10-19 NOTE — Discharge Instructions (Addendum)
[facility] Address: [place], [place], [place] [address] Phone: [phone]  Supported Employment The supported employment program is a person-centered, individualized, evidence-based support service that helps members choose, acquire, and maintain competitive employment in our community. This service supports the varying needs of individuals and promotes community inclusion and employment success. Members enrolled in the supported employment program can expect the following:  Development of an individual career plan Community based job placement Job shadowing Job development On-site job Teacher, early years/pre and support  Supported Education Supported education helps our members receive the education and training they need to achieve their learning and recovery goals. This will assist members with becoming gainfully employed in the job or career of their choice. The program includes assistance with: Registering for disability accommodations Enrolling in school and registering for classes Learning communication skills Scheduling tutoring sessions within your school [facility] partners with Vocational Rehabilitation to help increase the success of clients seeking employment and educational goals.  Want to learn more about our programs?   Please contact our intake department INTAKE: [phone] Ext [phone]  Mailing: [place]   [place], [place] [address]   [email or web address]             Substance Abuse Treatment Programs  Intensive Outpatient Programs [facility]     [address] [place], [place]       [facility] [place] [address] [place], [place]  [facility]     (Inpatient and outpatient)     [address]           [facility] [phone] (Suboxone and Methadone)  [place], [place] [address]      [place] [address] [place], [place]  [facility] [place] (Outpatient/Inpatient, Chemical)    (insurance only) [phone]             [facility] ([name]) [place], [place]     [facility]     [address]     [place], [place]       [facility] (for caregivers and family) [phone] [address] [place]een [place]. [place], [place]      Residential Treatment Programs [facility]      [address] [place], [place] [address]  [phone]       [facility] [address], [place], [place], [place] [address] [phone]  [facility] [place]        [phone]       [facility] [phone]  [facility] ([place]             [place], [place]  [phone] or [place], [address] [phone]  [facility] [place]    [address]      [place], [place]       [facility] [place], [place]  [place]   [address] [place], [place] [address]     [phone]      Admissions: 8am-3pm M-F  [facility] Services (RTS) [address] [place], [place]  [facility] Program: Residential Program [phone] Days)   [place], [place] or [phone]     ADATC: [facility] [place], [place] (Walk in Hours over the weekend or by referral)  [facility] [place], [place], [place] [address] [phone]  Crisis Mobile: [facility]:  [phone] (for crisis response 24 hours a day) [facility] Hotline:      [phone] Outpatient Psychiatry and  Counseling  [facility]: Mobile Crisis Management 24 hours:  [phone]  [facility] of the [employer] sliding scale fee and walk in schedule: M-F 8am-12pm/1pm-3pm [place], [place] [address] [address] [place], [place] [address] [phone]  [facility] (Formerly known as [facility] [employer])- new patient walk-in appointments available Monday - Friday 8am -3pm.          [address] [place], [place] [address] [phone] or crisis line- [place] Services/ Intensive Outpatient Therapy Program [place], [place] [address] [address] [place]      [phone] [address] [place], [place] [address]                 [place]   [facility] [phone]. [place], [place] [address]   [employer] of Care          [address] [place]  [place], [place] [address]       [phone]  [place], [place], [place] [address] [phone]  [facility]    [address] [place], [place] [address]     [place], [place]     [place] [address]     [phone]       [facility] [place] [address]  [facility]     [address] [place], [place], [place] [address] [place], [place] [address] [place]     [address]     [place], [place] [address]     [phone]       [facility] [address] [place], [place] [address] [phone] Resources for Temporary  Residential Assistance/Crisis  [facility]) M-F 8am-3pm   [address] [place], [place] [address]   [phone] Services include: laundry, barbering, support groups, case management, phone  & computer access, showers, AA/NA mtgs, mental health/substance abuse nurse, job skills class, disability information, VA assistance, spiritual classes, etc.   [facility]   [address] [place]     [place] (women and children)       [place]. [place], [place] [address] [phone] [email or web address] for application and process Application Required  [facility]   [address]    [place] [address]     [phone]                    [place], [place] [address] [phone]([email or web address] application appt.) Application Required  [facility] (women only)    [address]     [place], [place] [address]     [phone]      Intake starts 6pm daily Need valid ID, SSC, & Police report [employer] [address] [place], [place] [phone] Application Required  [employer] (men only)     [place].      [place], [place]       [place] (Pregnant women only) [address] [place], [place]  [facility]      [place] [address].      [place], [place] [address]     [phone]             [facility] [address] [place], [place] 90 day commitment/SA/Application process  [facility](men only)     [address]     [place], [place]       Check-in at [facility] [address] [place], [place] [address] [phone] Men/Women/Women and Children must be there by 7  pm  [place], [place]
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
## 2021-11-23 ENCOUNTER — Other Ambulatory Visit: Payer: Self-pay | Admitting: Family Medicine

## 2021-11-23 NOTE — Telephone Encounter (Signed)
Pt no longer under Dr. Smitty Pluck care effective 09/13/21 Sees Dr. Lin Landsman   Requested Prescriptions  Refused Prescriptions Disp Refills   potassium chloride SA (KLOR-CON M) 20 MEQ tablet [Pharmacy Med Name: POTASSIUM CHLORIDE CRYS ER 20 MEQ ORAL TABLET EXTENDED RELEASE] 180 tablet     Sig: TAKE 2 TABLETS (40 MEQ TOTAL) BY MOUTH 2 (TWO) TIMES DAILY.     Endocrinology:  Minerals - Potassium Supplementation Failed - 11/23/2021  3:05 PM      Failed - K in normal range and within 360 days    Potassium  Date Value Ref Range Status  09/19/2021 2.9 (L) 3.5 - 5.1 mmol/L Final         Failed - Cr in normal range and within 360 days    Creat  Date Value Ref Range Status  02/18/2016 1.22 (H) 0.50 - 1.05 mg/dL Final    Comment:      For patients > or = 62 years of age: The upper reference limit for Creatinine is approximately 13% higher for people identified as African-American.      Creatinine, Ser  Date Value Ref Range Status  09/19/2021 1.28 (H) 0.44 - 1.00 mg/dL Final   Creatinine,U  Date Value Ref Range Status  06/17/2012 521.94 mg/dL Final    Comment:    Result confirmed by automatic dilution.    Cutoff Values for Urine Drug Screen, Pain Mgmt          Drug Class           Cutoff (ng/mL)          Amphetamines             500          Barbiturates             200          Cocaine Metabolites      150          Benzodiazepines          200          Methadone                300          Opiates                  300          Phencyclidine             25          Propoxyphene             300          Marijuana Metabolites     50    For medical purposes only.   Creatinine, Urine  Date Value Ref Range Status  02/19/2020 300.84 mg/dL Final    Comment:    Performed at [facility] [place] [address], [place], [place] [address]         Passed - Valid encounter within last 12 months    Recent Outpatient Visits           4 months ago Frequent falls   [place], Enobong, MD   7 months ago Lumbar radiculopathy   [place], Enobong, MD   8 months ago Weakness of both lower extremities   [place], Enobong, MD   9 months ago  Edema of both legs   [place] Gildardo Pounds, NP   10 months ago Hospital discharge follow-up   [place] Gildardo Pounds, NP

## 2021-11-30 NOTE — Progress Notes (Deleted)
Cardiology Office Note:    Date:  11/30/2021   ID:  Tiffany [name], DOB [birth year], MRN [id number]  PCP:  Lin Landsman, MD   [facility] Providers Cardiologist:  Fransico Him, MD { Click to update primary MD,subspecialty MD or APP then REFRESH:1}    Referring MD: Lin Landsman, MD   Chief Complaint: ***  History of Present Illness:    Tiffany [name] is a *** 61 y.o. female with a hx of asthma, COPD on chronic O2, chronic HFpEF, HTN, type 2 diabetes, HLD, cocaine use, CKD, and schizophrenia.   Most recent cardiology clinic visit was 06/04/2020 with Dr. Harrington Challenger.  She had not been seen since 2019.  Appeared to be volume overloaded.  BNP was obtained and was severely elevated at > 11,000.  She was advised to start Lasix 80 mg daily and to take metolazone 2.5 mg twice weekly 30 minutes prior to Lasix.  Repeat BNP 10 days later revealed improvement to 2313 with stable kidney function.  She was advised to continue medications and return for follow-up labs in 1 month. BNP on 07/13/20 was normal but she was hypokalemic. Potassium supplement was increased.   No evidence of return office visit. Placed on schedule for hospital follow-up.  Most recent echocardiogram 07/10/2021 revealed normal LVEF 55 to 60%, no RWMA, mild LVH, indeterminate diastolic parameters, normal RV, normal pulmonary artery systolic pressure, trivial MR.  Today, she is here   Past Medical History:  Diagnosis Date   Agitation 11/22/2017   Anoxic brain injury ([place]) 09/08/2016   C. Arrest due to respiratory failure and COPD exacerbation   Anxiety    Arthritis    "all over" (04/10/2016)   Asthma 10/18/2010   Binge eating disorder    Cardiac arrest ([place]) 09/08/2016   PEA   Carotid artery stenosis    1-39% bilateral by dopplers 11/2016   Chronic diastolic (congestive) heart failure (HCC)    Chronic kidney disease, stage 3 (HCC)    Chronic pain syndrome 06/18/2012   Chronic post-traumatic stress disorder (PTSD) 05/27/2018    Chronic respiratory failure with hypoxia and hypercapnia ([place]) 06/22/2015   TRILOGY Vent >AVAPA-ES., Vt target 200-400, Max P 30 , PS max 20 , PS min 6-10 , E Max 6, E Min 4, Rate Auto AVAPS Rate 2 (titrate for pt comfort) , bleed O2 at 5l/m continuous flow .    Closed displaced fracture of fifth metacarpal bone 03/21/2018   Cocaine use disorder, severe, in sustained remission ([place]) 68/34/1962   Complication of anesthesia    decreased bp, decreased heart rate   COPD (chronic obstructive pulmonary disease) ([place]) 07/08/2014   Depression    Diabetic neuropathy ([place]) 04/24/2011   Difficulty with speech 01/24/2018   Disorder of nervous system    Drug abuse ([place]) 11/21/2017   Dyslipidemia 04/24/2011   Elevated troponin 04/28/2012   Emphysema    Encephalopathy 11/21/2017   Essential hypertension 03/22/2016   Fibula fracture 07/10/2016   Frequent falls 10/11/2017   GERD (gastroesophageal reflux disease)    Gout 04/11/2017   Heart attack ([place]) 1980s   History of blood transfusion 1994   "couldn't stop bleeding from my period"   History of drug abuse in remission ([place]) 11/28/2015   Quit in 2017   Hyperlipidemia LDL goal <70    Incontinence    Manic depression ([place])    Morbid obesity ([place]) 10/18/2010   Obstructive sleep apnea 10/18/2010   On home oxygen therapy    "6L;  24/7" (04/10/2016)   OSA on CPAP    "wear mask sometimes" (04/10/2016)   Paranoid ([place])    "sometimes; I'm on RX for it" (04/10/2016)   Prolonged Q-T interval on ECG    Rectal bleeding 12/31/2015   Schizoaffective disorder, bipolar type ([place]) 04/05/2018   Seasonal allergies    Seborrheic keratoses 12/31/2013   Seizures ([place])    "don't know what kind; last one was ~ 1 yr ago" (04/10/2016)   Sinus bradycardia    Stroke [facility]) 1980s   denies residual on 04/10/2016   Thrush 09/19/2013   Type 2 diabetes mellitus ([place]) 10/18/2010    Past Surgical History:  Procedure Laterality Date   CESAREAN SECTION  1997    COLONOSCOPY WITH PROPOFOL N/A 04/01/2021   Procedure: COLONOSCOPY WITH PROPOFOL;  Surgeon: Carol Ada, MD;  Location: [facility];  Service: Gastroenterology;  Laterality: N/A;  Rectal bleeding with drop in hemoglobin to 7.2 g/dL   HERNIA REPAIR     IR CHOLANGIOGRAM EXISTING TUBE  07/20/2016   IR PERC CHOLECYSTOSTOMY  05/10/2016   IR RADIOLOGIST EVAL & MGMT  06/08/2016   IR RADIOLOGIST EVAL & MGMT  06/29/2016   IR SINUS/FIST TUBE CHK-NON GI  07/12/2016   RIGHT/LEFT HEART CATH AND CORONARY ANGIOGRAPHY N/A 06/19/2017   Procedure: RIGHT/LEFT HEART CATH AND CORONARY ANGIOGRAPHY;  Surgeon: Jolaine Artist, MD;  Location: [facility];  Service: Cardiovascular;  Laterality: N/A;   TIBIA IM NAIL INSERTION Right 07/12/2016   Procedure: INTRAMEDULLARY (IM) NAIL RIGHT TIBIA;  Surgeon: Leandrew Koyanagi, MD;  Location: [facility];  Service: Orthopedics;  Laterality: Right;   UMBILICAL HERNIA REPAIR  ~ 1963   "that's why I don't have a belly button"   VAGINAL HYSTERECTOMY      Current Medications: No outpatient medications have been marked as taking for the 12/01/21 encounter (Appointment) with Ann Maki, Lanice Schwab, NP.     Allergies:   Hydrocodone, Latuda [lurasidone hcl], Magnesium-containing compounds, Prednisone, Tramadol, Codeine, Trazodone, Peanut-containing drug products, Topamax [topiramate], Sulfa antibiotics, and Tape   Social History   Socioeconomic History   Marital status: Widowed    Spouse name: Not on file   Number of children: 3   Years of education: 12th   Highest education level: 12th grade  Occupational History   Occupation: disabled    Comment: factory production  Tobacco Use   Smoking status: Former    Packs/day: 1.50    Years: 38.00    Total pack years: 57.00    Types: Cigarettes    Start date: 03/13/1977    Quit date: 04/10/2016    Years since quitting: 5.6    Passive exposure: Past   Smokeless tobacco: Never  Vaping Use   Vaping Use: Never used  Substance and Sexual  Activity   Alcohol use: No    Alcohol/week: 0.0 standard drinks of alcohol   Drug use: Not Currently    Types: Cocaine    Comment: 04/10/2016 "last used cocaine back in November 2017"   Sexual activity: Not Currently    Birth control/protection: Surgical  Other Topics Concern   Not on file  Social History Narrative   Has 1 son, [name]   Lives with son and his boyfriend   Her house has ramps and handrails should she ever needs them.    Her mother lives down the street from her and is a good support person in addition to her son.   She drives herself, has private transportation.  Cocaine free since 02/24/16, smoke free since 04/10/16   Social Determinants of Health   Financial Resource Strain: Medium Risk (07/24/2021)   Overall Financial Resource Strain (CARDIA)    Difficulty of Paying Living Expenses: Somewhat hard  Food Insecurity: No Food Insecurity (09/14/2021)   Hunger Vital Sign    Worried About Running Out of Food in the Last Year: Never true    Ran Out of Food in the Last Year: Never true  Recent Concern: Food Insecurity - Food Insecurity Present (06/23/2021)   Hunger Vital Sign    Worried About Running Out of Food in the Last Year: Never true    Ran Out of Food in the Last Year: Sometimes true  Transportation Needs: Unmet Transportation Needs (09/14/2021)   PRAPARE - Hydrologist (Medical): Yes    Lack of Transportation (Non-Medical): Yes  Physical Activity: Inactive (07/24/2021)   Exercise Vital Sign    Days of Exercise per Week: 0 days    Minutes of Exercise per Session: 0 min  Stress: Stress Concern Present (07/24/2021)   [place]    Feeling of Stress : To some extent  Social Connections: Moderately Isolated (07/24/2021)   Social Connection and Isolation Panel [NHANES]    Frequency of Communication with Friends and Family: More than three times a week    Frequency of Social  Gatherings with Friends and Family: More than three times a week    Attends Religious Services: 1 to 4 times per year    Active Member of [employer] or Organizations: No    Attends Archivist Meetings: Never    Marital Status: Widowed     Family History: The patient's ***family history includes Anxiety disorder in her sister; Bipolar disorder in her sister; Cancer in her father and mother; Depression in her brother, mother, sister, and sister; Heart failure in an other family member; Schizophrenia in her sister.  ROS:   Please see the history of present illness.    *** All other systems reviewed and are negative.  Labs/Other Studies Reviewed:    The following studies were reviewed today:  Echo 07/10/21   1. Left ventricular ejection fraction, by estimation, is 55 to 60%. The  left ventricle has normal function. The left ventricle has no regional  wall motion abnormalities. There is mild concentric left ventricular  hypertrophy. Left ventricular diastolic  parameters are indeterminate.   2. Right ventricular systolic function is normal. The right ventricular  size is normal. There is normal pulmonary artery systolic pressure.   3. The mitral valve is normal in structure. Trivial mitral valve  regurgitation.   4. The aortic valve is tricuspid. Aortic valve regurgitation is not  visualized. No aortic stenosis is present.  Carotid Duplex 07/10/21  Right Carotid: Velocities in the right ICA are consistent with a 1-39%  stenosis.   Left Carotid: Velocities in the left ICA are consistent with a 1-39%  stenosis.   Vertebrals: Bilateral vertebral arteries demonstrate antegrade flow.  Subclavians: Normal flow hemodynamics were seen in bilateral subclavian               arteries.   *See table(s) above for measurements and observations.  Recent Labs: 07/09/2021: B Natriuretic Peptide 43.3 07/12/2021: Magnesium 2.0 09/19/2021: ALT 15; BUN 14; Creatinine, Ser 1.28; Hemoglobin 10.6;  Platelets 439; Potassium 2.9; Sodium 140  Recent Lipid Panel    Component Value Date/Time   CHOL 127 08/05/2018  1748   TRIG 53 08/05/2018 1748   HDL 62 08/05/2018 1748   CHOLHDL 2.0 08/05/2018 1748   CHOLHDL 3.7 01/07/2017 0005   VLDL 38 01/07/2017 0005   LDLCALC 54 08/05/2018 1748   LDLDIRECT 52 04/05/2012 1210     Risk Assessment/Calculations:   {Does this patient have ATRIAL FIBRILLATION?:[id number]}       Physical Exam:    VS:  There were no vitals taken for this visit.    Wt Readings from Last 3 Encounters:  09/19/21 164 lb 14.5 oz (74.8 kg)  07/15/21 165 lb (74.8 kg)  07/10/21 171 lb 15.3 oz (78 kg)     GEN: *** Well nourished, well developed in no acute distress HEENT: Normal NECK: No JVD; No carotid bruits CARDIAC: ***RRR, no murmurs, rubs, gallops RESPIRATORY:  Clear to auscultation without rales, wheezing or rhonchi  ABDOMEN: Soft, non-tender, non-distended MUSCULOSKELETAL:  No edema; No deformity. *** pedal pulses, ***bilaterally SKIN: Warm and dry NEUROLOGIC:  Alert and oriented x 3 PSYCHIATRIC:  Normal affect   EKG:  EKG is *** ordered today.  The ekg ordered today demonstrates ***  No BP recorded.  {Refresh Note OR Click here to enter BP  :1}***    Diagnoses:    No diagnosis found. Assessment and Plan:     Chronic HFpEF: Hypertension:   {Are you ordering a CV Procedure (e.g. stress test, cath, DCCV, TEE, etc)?   Press F2        :[id number]}   Disposition:  Medication Adjustments/Labs and Tests Ordered: Current medicines are reviewed at length with the patient today.  Concerns regarding medicines are outlined above.  No orders of the defined types were placed in this encounter.  No orders of the defined types were placed in this encounter.   There are no Patient Instructions on file for this visit.   Signed, Emmaline Life, NP  11/30/2021 5:26 PM    [place]

## 2021-12-01 ENCOUNTER — Ambulatory Visit: Payer: Medicare HMO | Attending: Nurse Practitioner | Admitting: Nurse Practitioner

## 2022-01-05 ENCOUNTER — Encounter: Payer: Self-pay | Admitting: Student

## 2022-01-05 ENCOUNTER — Ambulatory Visit (INDEPENDENT_AMBULATORY_CARE_PROVIDER_SITE_OTHER): Payer: Medicare HMO | Admitting: Student

## 2022-01-05 VITALS — BP 133/86 | HR 70 | Wt 202.2 lb

## 2022-01-05 DIAGNOSIS — R197 Diarrhea, unspecified: Secondary | ICD-10-CM

## 2022-01-05 DIAGNOSIS — E1122 Type 2 diabetes mellitus with diabetic chronic kidney disease: Secondary | ICD-10-CM | POA: Diagnosis not present

## 2022-01-05 DIAGNOSIS — Z7689 Persons encountering health services in other specified circumstances: Secondary | ICD-10-CM

## 2022-01-05 DIAGNOSIS — N1832 Chronic kidney disease, stage 3b: Secondary | ICD-10-CM

## 2022-01-05 DIAGNOSIS — J42 Unspecified chronic bronchitis: Secondary | ICD-10-CM | POA: Diagnosis not present

## 2022-01-05 DIAGNOSIS — Z23 Encounter for immunization: Secondary | ICD-10-CM

## 2022-01-05 DIAGNOSIS — I5032 Chronic diastolic (congestive) heart failure: Secondary | ICD-10-CM

## 2022-01-05 LAB — POCT GLYCOSYLATED HEMOGLOBIN (HGB A1C): HbA1c, POC (controlled diabetic range): 5 % (ref 0.0–7.0)

## 2022-01-05 MED ORDER — FEBUXOSTAT 40 MG PO TABS: 40.0000 mg | ORAL_TABLET | Freq: Every day | ORAL | 3 refills | Status: DC

## 2022-01-05 MED ORDER — ALBUTEROL SULFATE HFA 108 (90 BASE) MCG/ACT IN AERS: 2.0000 | INHALATION_SPRAY | Freq: Four times a day (QID) | RESPIRATORY_TRACT | 2 refills | Status: DC | PRN

## 2022-01-05 MED ORDER — TRELEGY ELLIPTA 100-62.5-25 MCG/ACT IN AEPB: 1.0000 | INHALATION_SPRAY | Freq: Every day | RESPIRATORY_TRACT | 1 refills | Status: DC

## 2022-01-05 MED ORDER — DIAZEPAM 5 MG PO TABS: ORAL_TABLET | ORAL | 1 refills | Status: AC

## 2022-01-05 MED ORDER — LOPERAMIDE HCL 2 MG PO CAPS: 4.0000 mg | ORAL_CAPSULE | Freq: Three times a day (TID) | ORAL | 0 refills | Status: DC | PRN

## 2022-01-05 MED ORDER — PRAZOSIN HCL 2 MG PO CAPS: 2.0000 mg | ORAL_CAPSULE | Freq: Every day | ORAL | 1 refills | Status: DC

## 2022-01-05 MED ORDER — DICYCLOMINE HCL 10 MG PO CAPS: 10.0000 mg | ORAL_CAPSULE | Freq: Three times a day (TID) | ORAL | 6 refills | Status: DC

## 2022-01-05 NOTE — Assessment & Plan Note (Addendum)
Previous patient >3 years ago. Re-establish care today. Refilled medications. Follow-up at earliest convenience to discuss chronic medical conditions.  Benzodiazapine use -Taper initiated with goal to d/c completely. - Will not refill at next visit.

## 2022-01-05 NOTE — Progress Notes (Signed)
    SUBJECTIVE:   CHIEF COMPLAINT / HPI:   Per[place] back because she got better care with [name] previously.    Specialists -Used to see Dr. Harrington Challenger (would like to go back) -Used to see   Medication Gabapentin 800 mg two times a day (written as daily) Duloxetine 30 mg two times a day (written as daily) Dyclomine 10 mg 3 times a day Escitalopram 20 mg every day Omeprazole 40 mg once daily Olanzapine 5 mg once daily Kcl 40 mEQ twice daily Amlodipine 10 mg once day Prazosin 2 mg nightly Atorvastatin 20 mg  daily Oxybutynin '5mg'$  twice daily Loperamide 2 mg three times daily Montelukast 10 mg once daily Diazepam 10 mg three times a day (written 5 mg once daily) Furosemide 40 mg once a day Diclofenac 75 mg twice daily Febuxostat 40 mg daily (ran out) Torsemide 20 mg once a day Diclofenac gel Saphris 10 mg sublignual taking 3 times a day  Trelegy: 1 puff per day Albuterol: 2 puffs per day  Needs refills of: Febuxostat Prazosin Escitalopram Duloxetine Loperamide Dicyclomine  Albuterol Trelegy  Social: -No alcohol and drugs - Used to smoke (quit doesn't remember when)  Medical history reviewed in chart  OBJECTIVE:   BP 133/86   Pulse 70   Wt 202 lb 4 oz (91.7 kg)   SpO2 96%   BMI 34.72 kg/m    General: NAD, wheel chair bound Cardio: RRR, no MRG. Bilateral +1 pitting edema (chronic) Resp: CTAB, normal wob on RA Skin: Warm and dry  ASSESSMENT/PLAN:   Encounter to establish care Previous patient >3 years ago. Re-establish care today. Refilled medications.  Benzodiazapine use -Taper initiated with goal to d/c completely. - Will not refill at next visit.  Need for COVID-19 vaccination Covid-19 booster  Follow-up recommendations for next visit 1.) Cyst in spine 2.) Would like Physical Therapy 3.) Care gaps (Lung cancer screening) 4.) Need to complete med rec in chart  Tiffany [name], [place]

## 2022-01-05 NOTE — Patient Instructions (Signed)
It was great to see you! Thank you for allowing me to participate in your care!   I recommend that you always bring your medications to each appointment as this makes it easy to ensure we are on the correct medications and helps [name] not miss when refills are needed.  Our plans for today:  - Take 5 mg diazepam 3 times a day for 1 week. Then 5 mg two times a day for 1 week. Then 5 mg once a day for 1 week. Then stop this medication. - I have refilled your medications - I have sent a referral to Cardiology - Please follow-up in the next 3-4 weeks to discuss your other chronic medical conditions  We are checking some labs today, I will call you if they are abnormal will send you a MyChart message or a letter if they are normal.  If you do not hear about your labs in the next 2 weeks please let us know.  Take care and seek immediate care sooner if you develop any concerns. Please remember to show up 15 minutes before your scheduled appointment time!  Leslie Dales, DO [facility]

## 2022-01-06 ENCOUNTER — Telehealth: Payer: Self-pay | Admitting: Student

## 2022-01-06 LAB — BASIC METABOLIC PANEL
BUN/Creatinine Ratio: 10 — ABNORMAL LOW (ref 12–28)
BUN: 24 mg/dL (ref 8–27)
CO2: 23 mmol/L (ref 20–29)
Calcium: 9.1 mg/dL (ref 8.7–10.3)
Chloride: 104 mmol/L (ref 96–106)
Creatinine, Ser: 2.48 mg/dL — ABNORMAL HIGH (ref 0.57–1.00)
Glucose: 69 mg/dL — ABNORMAL LOW (ref 70–99)
Potassium: 4.4 mmol/L (ref 3.5–5.2)
Sodium: 142 mmol/L (ref 134–144)
eGFR: 21 mL/min/{1.73_m2} — ABNORMAL LOW (ref >59)

## 2022-01-06 NOTE — Telephone Encounter (Signed)
Called home phone (preferred) twice and mobile phone once. Left HIPAA compliant voicemail. When patient returns call:   Renal function was reduced, concern for acute kidney injury. She needs to be seen for repeat lab work at earliest convenience. Preferably Wednesday 12/27 for a BMP.  Stop taking Potassium supplement, oxybutynin, diclofenac. This can harm the kidney. Please clarify that patient is not taking Furosemide and torsemide. She needs to discontinue furosemide and continue torsemide.  Please reduce gabapentin to 600 mg total per day. I can write a new prescription if needed.  If renal function is not improved, we will need to send to Nephrology.

## 2022-01-11 ENCOUNTER — Other Ambulatory Visit: Payer: Self-pay

## 2022-01-11 DIAGNOSIS — G894 Chronic pain syndrome: Secondary | ICD-10-CM

## 2022-01-11 DIAGNOSIS — R197 Diarrhea, unspecified: Secondary | ICD-10-CM

## 2022-01-11 MED ORDER — DICLOFENAC SODIUM 75 MG PO TBEC: 75.0000 mg | DELAYED_RELEASE_TABLET | Freq: Two times a day (BID) | ORAL | 0 refills | Status: DC

## 2022-01-12 ENCOUNTER — Other Ambulatory Visit: Payer: Self-pay

## 2022-01-12 DIAGNOSIS — R197 Diarrhea, unspecified: Secondary | ICD-10-CM

## 2022-01-16 DIAGNOSIS — M170 Bilateral primary osteoarthritis of knee: Secondary | ICD-10-CM | POA: Diagnosis not present

## 2022-01-16 DIAGNOSIS — Z9181 History of falling: Secondary | ICD-10-CM | POA: Diagnosis not present

## 2022-01-18 ENCOUNTER — Telehealth: Payer: Self-pay

## 2022-01-18 NOTE — Telephone Encounter (Signed)
Patient calls nurse line regarding refill request for loperamide. [name] requesting increased quantity as she has been having to take this daily.   She also states that she needs authorization on medication. [name] unsure which medication needs authorization. Called pharmacy. Pharmacist states that Bent[facility] needs PA.  Attempted to complete PA via Cover my meds. Received message that medication is on insurance formulary and does not require PA.    Returned call to pharmacy. They were running medication under old insurance plan. Medication went through with no issues with new insurance.   Patient still needs refill of loperamide. If appropriate, please increase quantity and send to [facility] on [address].   Forwarding to PCP.   Talbot Grumbling, RN

## 2022-01-23 ENCOUNTER — Ambulatory Visit (INDEPENDENT_AMBULATORY_CARE_PROVIDER_SITE_OTHER): Payer: Medicare Other | Admitting: Student

## 2022-01-23 ENCOUNTER — Encounter: Payer: Self-pay | Admitting: Student

## 2022-01-23 VITALS — BP 185/81 | HR 66 | Ht 64.0 in | Wt 189.4 lb

## 2022-01-23 DIAGNOSIS — J439 Emphysema, unspecified: Secondary | ICD-10-CM | POA: Diagnosis not present

## 2022-01-23 DIAGNOSIS — I252 Old myocardial infarction: Secondary | ICD-10-CM | POA: Diagnosis not present

## 2022-01-23 DIAGNOSIS — Z556 Problems related to health literacy: Secondary | ICD-10-CM | POA: Diagnosis not present

## 2022-01-23 DIAGNOSIS — N1832 Chronic kidney disease, stage 3b: Secondary | ICD-10-CM

## 2022-01-23 DIAGNOSIS — E782 Mixed hyperlipidemia: Secondary | ICD-10-CM | POA: Diagnosis not present

## 2022-01-23 DIAGNOSIS — F32A Depression, unspecified: Secondary | ICD-10-CM | POA: Diagnosis not present

## 2022-01-23 DIAGNOSIS — D631 Anemia in chronic kidney disease: Secondary | ICD-10-CM | POA: Diagnosis not present

## 2022-01-23 DIAGNOSIS — Z993 Dependence on wheelchair: Secondary | ICD-10-CM | POA: Diagnosis not present

## 2022-01-23 DIAGNOSIS — I6523 Occlusion and stenosis of bilateral carotid arteries: Secondary | ICD-10-CM | POA: Diagnosis not present

## 2022-01-23 DIAGNOSIS — I5032 Chronic diastolic (congestive) heart failure: Secondary | ICD-10-CM | POA: Diagnosis not present

## 2022-01-23 DIAGNOSIS — J9611 Chronic respiratory failure with hypoxia: Secondary | ICD-10-CM | POA: Diagnosis not present

## 2022-01-23 DIAGNOSIS — F1491 Cocaine use, unspecified, in remission: Secondary | ICD-10-CM | POA: Diagnosis not present

## 2022-01-23 DIAGNOSIS — G8929 Other chronic pain: Secondary | ICD-10-CM | POA: Diagnosis not present

## 2022-01-23 DIAGNOSIS — E1122 Type 2 diabetes mellitus with diabetic chronic kidney disease: Secondary | ICD-10-CM | POA: Diagnosis not present

## 2022-01-23 DIAGNOSIS — I2510 Atherosclerotic heart disease of native coronary artery without angina pectoris: Secondary | ICD-10-CM | POA: Diagnosis not present

## 2022-01-23 DIAGNOSIS — M1990 Unspecified osteoarthritis, unspecified site: Secondary | ICD-10-CM | POA: Diagnosis not present

## 2022-01-23 DIAGNOSIS — R600 Localized edema: Secondary | ICD-10-CM

## 2022-01-23 DIAGNOSIS — J4489 Other specified chronic obstructive pulmonary disease: Secondary | ICD-10-CM | POA: Diagnosis not present

## 2022-01-23 DIAGNOSIS — E1141 Type 2 diabetes mellitus with diabetic mononeuropathy: Secondary | ICD-10-CM | POA: Diagnosis not present

## 2022-01-23 DIAGNOSIS — M5459 Other low back pain: Secondary | ICD-10-CM | POA: Diagnosis not present

## 2022-01-23 DIAGNOSIS — I130 Hypertensive heart and chronic kidney disease with heart failure and stage 1 through stage 4 chronic kidney disease, or unspecified chronic kidney disease: Secondary | ICD-10-CM | POA: Diagnosis not present

## 2022-01-23 MED ORDER — LOPERAMIDE HCL 2 MG PO CAPS: 4.0000 mg | ORAL_CAPSULE | Freq: Three times a day (TID) | ORAL | 2 refills | Status: DC | PRN

## 2022-01-23 NOTE — Patient Instructions (Addendum)
It was great to see you! Thank you for allowing me to participate in your care!   I recommend that you always bring your medications to each appointment as this makes it easy to ensure we are on the correct medications and helps [name] not miss when refills are needed.  Our plans for today:  -I have checked your blood for renal function, I will call you with the results -We recommend that you stop taking oxybutynin  -We recommend taking TWO '20mg'$  torsemide tablets per day  -I have refilled your loperamide -If you are short of breath and unable to breathe or unable to speak please go to the emergency department -If you develop chest pain please go to the emergency department -I have sent a referral to our pharmacy team -I recommend following up in 4 weeks  Take care and seek immediate care sooner if you develop any concerns. Please remember to show up 15 minutes before your scheduled appointment time!  Leslie Dales, DO [facility]

## 2022-01-23 NOTE — Assessment & Plan Note (Signed)
>>  ASSESSMENT AND PLAN FOR STAGE 3B CHRONIC KIDNEY DISEASE ([place]) WRITTEN ON 01/23/2022  4:05 PM BY Leslie Dales, DO  BMP from 12/21 showed Cr 2.48 (baseline 1.2) and GFR of 21. AKI v Worsening CKD. Counseled patient to not take furosemide and torsemide together.  Additionally recommended to renally dose gabapentin, however patient reports that she is not taking her gabapentin.  Recommended to stop diclofenac tablets. - Repeat BMP - Consider nephrology consult pending BMP result

## 2022-01-23 NOTE — Assessment & Plan Note (Addendum)
Multiple etiologies for leg edema including known diastolic heart failure, CKD, AKI, urinary retention from medication.  Patient is currently stable and without red flag symptoms.  Discussed in detail return precautions including when to seek emergent medical care at the ED.  Bilateral +2 pitting edema on exam.  Recommended discontinuing oxybutynin, after extensive conversation, patient does not want to stop medication due to fear of incontinence.  We will try outpatient diuresis. - Torsemide 40 mg daily

## 2022-01-23 NOTE — Progress Notes (Signed)
    SUBJECTIVE:   CHIEF COMPLAINT / HPI:   Elevated creatinine/GFR At last visit patient had elevated GFR and creatinine, multiple attempts were made to contact patient by phone but unable to.  She reports not receiving HIPAA compliant voicemail that was left on 01/06/2022.  Recommend renal dosing of duloxetine.  Recommend stopping potassium, oxybutynin and diclofenac.  Patient does not want to stop oxybutynin because she is incontinent, we discussed extensively how this may be contributing to her leg edema as below.  At this time patient does not want to renally dose medications or stop current regimen.  Patient is agreeable to rechecking BMP, nephrology consult if necessary, and discussing polypharmacy with pharmacist.  Bilateral leg edema Leg swelling. Arm swelling. Painful. No alcohol. No illicit drugs. No smoking. Stilll taking her medications as listed from last visit. Torsemide 1nce a day 20 mg.   ROS: Denies chest pain, shortness of breath, worsening cough  PERTINENT  PMH / PSH: CHF, CKD, COPD, Overactive bladder  OBJECTIVE:   BP (!) 185/81   Pulse 66   Ht '5\' 4"'$  (1.626 m)   Wt 189 lb 6 oz (85.9 kg) Comment: non ambulatory  SpO2 96%   BMI 32.51 kg/m    General: NAD, pleasant, chronically ill-appearing Cardio: RRR, no MRG Respiratory: CTAB, normal work breathing on room air Extremities: +2 bilateral pitting edema Neuro: CN II: PERRL CN III, IV,VI: EOMI CV V: Normal sensation in V1, V2, V3 CVII: Symmetric smile and brow raise CN VIII: Normal hearing CN IX,X: Symmetric palate raise  CN XI: 5/5 shoulder shrug CN XII: Symmetric tongue protrusion  UE strength 5/5 Normal sensation in UE and LE bilaterally    ASSESSMENT/PLAN:   Chronic kidney disease, stage 3b (HCC) BMP from 12/21 showed Cr 2.48 (baseline 1.2) and GFR of 21. AKI v Worsening CKD. Counseled patient to not take furosemide and torsemide together.  Additionally recommended to renally dose gabapentin, however  patient reports that she is not taking her gabapentin.  Recommended to stop diclofenac tablets. - Repeat BMP - Consider nephrology consult pending BMP result  Bilateral leg edema Multiple etiologies for leg edema including known diastolic heart failure, CKD, AKI, urinary retention from medication.  Patient is currently stable and without red flag symptoms.  Discussed in detail return precautions including when to seek emergent medical care at the ED.  Bilateral +2 pitting edema on exam.  Recommended discontinuing oxybutynin, after extensive conversation, patient does not want to stop medication due to fear of incontinence.  We will try outpatient diuresis. - Torsemide 40 mg daily  Follow-up recommendations 1.) BP elevated today, patient endorses feeling anxious. We will recheck and address regimen at next visit. 2.) Pharmacy consult placed for polypharmacy, with concern of conflicting medication management. Follow-up 3.) Recommend Nephrology referral  Leslie Dales, [place]

## 2022-01-23 NOTE — Assessment & Plan Note (Addendum)
BMP from 12/21 showed Cr 2.48 (baseline 1.2) and GFR of 21. AKI v Worsening CKD. Counseled patient to not take furosemide and torsemide together.  Additionally recommended to renally dose gabapentin, however patient reports that she is not taking her gabapentin.  Recommended to stop diclofenac tablets. - Repeat BMP - Consider nephrology consult pending BMP result

## 2022-01-26 DIAGNOSIS — I130 Hypertensive heart and chronic kidney disease with heart failure and stage 1 through stage 4 chronic kidney disease, or unspecified chronic kidney disease: Secondary | ICD-10-CM | POA: Diagnosis not present

## 2022-01-26 DIAGNOSIS — Z556 Problems related to health literacy: Secondary | ICD-10-CM | POA: Diagnosis not present

## 2022-01-26 DIAGNOSIS — N1832 Chronic kidney disease, stage 3b: Secondary | ICD-10-CM | POA: Diagnosis not present

## 2022-01-26 DIAGNOSIS — F32A Depression, unspecified: Secondary | ICD-10-CM | POA: Diagnosis not present

## 2022-01-26 DIAGNOSIS — M5459 Other low back pain: Secondary | ICD-10-CM | POA: Diagnosis not present

## 2022-01-26 DIAGNOSIS — J439 Emphysema, unspecified: Secondary | ICD-10-CM | POA: Diagnosis not present

## 2022-01-26 DIAGNOSIS — I252 Old myocardial infarction: Secondary | ICD-10-CM | POA: Diagnosis not present

## 2022-01-26 DIAGNOSIS — F1491 Cocaine use, unspecified, in remission: Secondary | ICD-10-CM | POA: Diagnosis not present

## 2022-01-26 DIAGNOSIS — Z993 Dependence on wheelchair: Secondary | ICD-10-CM | POA: Diagnosis not present

## 2022-01-26 DIAGNOSIS — M1990 Unspecified osteoarthritis, unspecified site: Secondary | ICD-10-CM | POA: Diagnosis not present

## 2022-01-26 DIAGNOSIS — I2510 Atherosclerotic heart disease of native coronary artery without angina pectoris: Secondary | ICD-10-CM | POA: Diagnosis not present

## 2022-01-26 DIAGNOSIS — G8929 Other chronic pain: Secondary | ICD-10-CM | POA: Diagnosis not present

## 2022-01-26 DIAGNOSIS — E782 Mixed hyperlipidemia: Secondary | ICD-10-CM | POA: Diagnosis not present

## 2022-01-26 DIAGNOSIS — E1141 Type 2 diabetes mellitus with diabetic mononeuropathy: Secondary | ICD-10-CM | POA: Diagnosis not present

## 2022-01-26 DIAGNOSIS — J4489 Other specified chronic obstructive pulmonary disease: Secondary | ICD-10-CM | POA: Diagnosis not present

## 2022-01-26 DIAGNOSIS — J9611 Chronic respiratory failure with hypoxia: Secondary | ICD-10-CM | POA: Diagnosis not present

## 2022-01-26 DIAGNOSIS — I5032 Chronic diastolic (congestive) heart failure: Secondary | ICD-10-CM | POA: Diagnosis not present

## 2022-01-26 DIAGNOSIS — D631 Anemia in chronic kidney disease: Secondary | ICD-10-CM | POA: Diagnosis not present

## 2022-01-26 DIAGNOSIS — I6523 Occlusion and stenosis of bilateral carotid arteries: Secondary | ICD-10-CM | POA: Diagnosis not present

## 2022-01-26 DIAGNOSIS — E1122 Type 2 diabetes mellitus with diabetic chronic kidney disease: Secondary | ICD-10-CM | POA: Diagnosis not present

## 2022-01-31 ENCOUNTER — Other Ambulatory Visit: Payer: Self-pay | Admitting: Family Medicine

## 2022-01-31 ENCOUNTER — Telehealth: Payer: Self-pay

## 2022-01-31 ENCOUNTER — Telehealth: Payer: Self-pay | Admitting: Student

## 2022-01-31 NOTE — Telephone Encounter (Signed)
Patient calls nurse line reporting decreased O2 sats.   She reports she noticed her levels dropping this morning to 89-90%. She reports she does have some mucous build up and has been coughing up brownish phlegm since yesterday.   She denies any fevers, body aches or chills. She denies SOB or chest pains. [name] speaking in full sentences while speaking to me on the phone. Her current O2 sat is 93%.   Patient advised she needs to be evaluated. She reports she has a scheduled apt already with PCP on 1/18. She reports she is unable to drive to herself and can not be seen any sooner.   Strict precautions given to patient to call EMS if her O2 sats continue to drop again or she becomes SOB.   Patient agreed with plan.   Will forward to PCP.

## 2022-01-31 NOTE — Telephone Encounter (Signed)
Pt calls after hours line:  Two days ago had dark red phlegm, today has a dry hacking cough. Feels like something is in her chest, she is coughing but nothing will come up. She feels like she has a pressure in her chest. Became SOB today, is not speaking in complete sentences. States she has had O2 saturations in high 80's-low 90's throughout the day but now more so in high 80's. Denies any fever, chills, body aches.   Pt advised to call an ambulance for transportation to the ED for further evaluation. She agrees with this plan.

## 2022-02-02 ENCOUNTER — Ambulatory Visit: Payer: 59 | Admitting: Student

## 2022-02-02 ENCOUNTER — Telehealth: Payer: Self-pay | Admitting: Student

## 2022-02-02 ENCOUNTER — Other Ambulatory Visit: Payer: Self-pay | Admitting: Student

## 2022-02-02 NOTE — Telephone Encounter (Signed)
Spoke with patient on phone.  Patient reports that for the past 3 days she is having increasing shortness of breath, with desaturations of oxygen to the high 80s.  [name] wheelchair-bound at baseline.  Also endorses increased cough and sputum production.  Reports that she is having worsening bilateral lower extremity edema, despite taking torsemide at home.  Still unclear if patient is taking incontinence medications, despite counseling against it.  Patient was unable to make appointment today.  I do have concern for cardio, renal, pulmonary, infectious pathology.  Differential includes but not limited to acute congestive heart failure exacerbation, COPD exacerbation, PE, acute renal injury, flu/COVID/pneumonia. I believe that she should be evaluated in the ED.  Patient reported she did not want to go to the emergency department.  I reiterated that medically, it is my opinion that she should be evaluated in the ED, and that I was concerned for her health.  Patient said that she will consider going to the hospital.  Additionally recommended she make appoint with me follow-up, however highly recommend she go to the emergency department today.

## 2022-02-03 ENCOUNTER — Emergency Department (HOSPITAL_COMMUNITY): Payer: 59

## 2022-02-03 ENCOUNTER — Inpatient Hospital Stay (HOSPITAL_COMMUNITY)
Admission: EM | Admit: 2022-02-03 | Discharge: 2022-02-07 | DRG: 291 | Disposition: A | Discharge status: Home Health | Payer: 59 | Attending: Family Medicine | Admitting: Family Medicine

## 2022-02-03 ENCOUNTER — Encounter (HOSPITAL_COMMUNITY): Payer: Self-pay

## 2022-02-03 DIAGNOSIS — G8929 Other chronic pain: Secondary | ICD-10-CM | POA: Diagnosis not present

## 2022-02-03 DIAGNOSIS — J9811 Atelectasis: Secondary | ICD-10-CM | POA: Diagnosis not present

## 2022-02-03 DIAGNOSIS — K5900 Constipation, unspecified: Secondary | ICD-10-CM | POA: Diagnosis not present

## 2022-02-03 DIAGNOSIS — E1122 Type 2 diabetes mellitus with diabetic chronic kidney disease: Secondary | ICD-10-CM | POA: Diagnosis not present

## 2022-02-03 DIAGNOSIS — Z993 Dependence on wheelchair: Secondary | ICD-10-CM | POA: Diagnosis not present

## 2022-02-03 DIAGNOSIS — N281 Cyst of kidney, acquired: Secondary | ICD-10-CM | POA: Diagnosis not present

## 2022-02-03 DIAGNOSIS — N189 Chronic kidney disease, unspecified: Secondary | ICD-10-CM

## 2022-02-03 DIAGNOSIS — M5459 Other low back pain: Secondary | ICD-10-CM | POA: Diagnosis not present

## 2022-02-03 DIAGNOSIS — I2510 Atherosclerotic heart disease of native coronary artery without angina pectoris: Secondary | ICD-10-CM | POA: Diagnosis present

## 2022-02-03 DIAGNOSIS — F32A Depression, unspecified: Secondary | ICD-10-CM | POA: Diagnosis not present

## 2022-02-03 DIAGNOSIS — F259 Schizoaffective disorder, unspecified: Secondary | ICD-10-CM | POA: Diagnosis present

## 2022-02-03 DIAGNOSIS — Z743 Need for continuous supervision: Secondary | ICD-10-CM | POA: Diagnosis not present

## 2022-02-03 DIAGNOSIS — G4733 Obstructive sleep apnea (adult) (pediatric): Secondary | ICD-10-CM | POA: Diagnosis not present

## 2022-02-03 DIAGNOSIS — R0689 Other abnormalities of breathing: Secondary | ICD-10-CM | POA: Diagnosis not present

## 2022-02-03 DIAGNOSIS — R0602 Shortness of breath: Secondary | ICD-10-CM | POA: Diagnosis not present

## 2022-02-03 DIAGNOSIS — M351 Other overlap syndromes: Secondary | ICD-10-CM | POA: Diagnosis present

## 2022-02-03 DIAGNOSIS — Z1152 Encounter for screening for COVID-19: Secondary | ICD-10-CM

## 2022-02-03 DIAGNOSIS — N1832 Chronic kidney disease, stage 3b: Secondary | ICD-10-CM | POA: Diagnosis not present

## 2022-02-03 DIAGNOSIS — G4710 Hypersomnia, unspecified: Secondary | ICD-10-CM

## 2022-02-03 DIAGNOSIS — Z79899 Other long term (current) drug therapy: Secondary | ICD-10-CM

## 2022-02-03 DIAGNOSIS — D631 Anemia in chronic kidney disease: Secondary | ICD-10-CM | POA: Diagnosis not present

## 2022-02-03 DIAGNOSIS — Z91048 Other nonmedicinal substance allergy status: Secondary | ICD-10-CM

## 2022-02-03 DIAGNOSIS — N184 Chronic kidney disease, stage 4 (severe): Secondary | ICD-10-CM | POA: Diagnosis not present

## 2022-02-03 DIAGNOSIS — Z885 Allergy status to narcotic agent status: Secondary | ICD-10-CM

## 2022-02-03 DIAGNOSIS — R609 Edema, unspecified: Secondary | ICD-10-CM | POA: Diagnosis not present

## 2022-02-03 DIAGNOSIS — J9611 Chronic respiratory failure with hypoxia: Secondary | ICD-10-CM | POA: Diagnosis not present

## 2022-02-03 DIAGNOSIS — I252 Old myocardial infarction: Secondary | ICD-10-CM

## 2022-02-03 DIAGNOSIS — J449 Chronic obstructive pulmonary disease, unspecified: Secondary | ICD-10-CM | POA: Diagnosis not present

## 2022-02-03 DIAGNOSIS — R918 Other nonspecific abnormal finding of lung field: Secondary | ICD-10-CM | POA: Diagnosis not present

## 2022-02-03 DIAGNOSIS — Z888 Allergy status to other drugs, medicaments and biological substances status: Secondary | ICD-10-CM

## 2022-02-03 DIAGNOSIS — Z87891 Personal history of nicotine dependence: Secondary | ICD-10-CM | POA: Diagnosis not present

## 2022-02-03 DIAGNOSIS — I5033 Acute on chronic diastolic (congestive) heart failure: Secondary | ICD-10-CM | POA: Diagnosis present

## 2022-02-03 DIAGNOSIS — I130 Hypertensive heart and chronic kidney disease with heart failure and stage 1 through stage 4 chronic kidney disease, or unspecified chronic kidney disease: Secondary | ICD-10-CM | POA: Diagnosis not present

## 2022-02-03 DIAGNOSIS — J4489 Other specified chronic obstructive pulmonary disease: Secondary | ICD-10-CM | POA: Diagnosis not present

## 2022-02-03 DIAGNOSIS — Z556 Problems related to health literacy: Secondary | ICD-10-CM | POA: Diagnosis not present

## 2022-02-03 DIAGNOSIS — R0600 Dyspnea, unspecified: Secondary | ICD-10-CM | POA: Diagnosis present

## 2022-02-03 DIAGNOSIS — R6889 Other general symptoms and signs: Secondary | ICD-10-CM | POA: Diagnosis not present

## 2022-02-03 DIAGNOSIS — J189 Pneumonia, unspecified organism: Secondary | ICD-10-CM | POA: Diagnosis present

## 2022-02-03 DIAGNOSIS — I5031 Acute diastolic (congestive) heart failure: Secondary | ICD-10-CM | POA: Diagnosis not present

## 2022-02-03 DIAGNOSIS — N179 Acute kidney failure, unspecified: Secondary | ICD-10-CM | POA: Diagnosis not present

## 2022-02-03 DIAGNOSIS — M1990 Unspecified osteoarthritis, unspecified site: Secondary | ICD-10-CM | POA: Diagnosis not present

## 2022-02-03 DIAGNOSIS — I10 Essential (primary) hypertension: Secondary | ICD-10-CM

## 2022-02-03 DIAGNOSIS — E785 Hyperlipidemia, unspecified: Secondary | ICD-10-CM | POA: Diagnosis present

## 2022-02-03 DIAGNOSIS — K219 Gastro-esophageal reflux disease without esophagitis: Secondary | ICD-10-CM | POA: Diagnosis present

## 2022-02-03 DIAGNOSIS — I6523 Occlusion and stenosis of bilateral carotid arteries: Secondary | ICD-10-CM | POA: Diagnosis not present

## 2022-02-03 DIAGNOSIS — Z8673 Personal history of transient ischemic attack (TIA), and cerebral infarction without residual deficits: Secondary | ICD-10-CM

## 2022-02-03 DIAGNOSIS — R5381 Other malaise: Secondary | ICD-10-CM | POA: Diagnosis not present

## 2022-02-03 DIAGNOSIS — R4182 Altered mental status, unspecified: Secondary | ICD-10-CM | POA: Diagnosis not present

## 2022-02-03 DIAGNOSIS — I2721 Secondary pulmonary arterial hypertension: Secondary | ICD-10-CM | POA: Diagnosis present

## 2022-02-03 DIAGNOSIS — J439 Emphysema, unspecified: Secondary | ICD-10-CM | POA: Diagnosis not present

## 2022-02-03 DIAGNOSIS — E782 Mixed hyperlipidemia: Secondary | ICD-10-CM | POA: Diagnosis not present

## 2022-02-03 DIAGNOSIS — F419 Anxiety disorder, unspecified: Secondary | ICD-10-CM | POA: Diagnosis present

## 2022-02-03 DIAGNOSIS — F1491 Cocaine use, unspecified, in remission: Secondary | ICD-10-CM | POA: Diagnosis not present

## 2022-02-03 DIAGNOSIS — Z882 Allergy status to sulfonamides status: Secondary | ICD-10-CM

## 2022-02-03 DIAGNOSIS — E1141 Type 2 diabetes mellitus with diabetic mononeuropathy: Secondary | ICD-10-CM | POA: Diagnosis not present

## 2022-02-03 DIAGNOSIS — Z91010 Allergy to peanuts: Secondary | ICD-10-CM

## 2022-02-03 DIAGNOSIS — I5032 Chronic diastolic (congestive) heart failure: Secondary | ICD-10-CM | POA: Diagnosis not present

## 2022-02-03 DIAGNOSIS — R109 Unspecified abdominal pain: Secondary | ICD-10-CM | POA: Diagnosis present

## 2022-02-03 DIAGNOSIS — R0902 Hypoxemia: Secondary | ICD-10-CM | POA: Diagnosis not present

## 2022-02-03 DIAGNOSIS — R404 Transient alteration of awareness: Secondary | ICD-10-CM | POA: Diagnosis not present

## 2022-02-03 DIAGNOSIS — R400 Somnolence: Secondary | ICD-10-CM | POA: Diagnosis not present

## 2022-02-03 HISTORY — DX: Chronic kidney disease, unspecified: N18.9

## 2022-02-03 LAB — I-STAT VENOUS BLOOD GAS, ED
Acid-Base Excess: 0 mmol/L (ref 0.0–2.0)
Bicarbonate: 22.6 mmol/L (ref 20.0–28.0)
Calcium, Ion: 1.07 mmol/L — ABNORMAL LOW (ref 1.15–1.40)
HCT: 30 % — ABNORMAL LOW (ref 36.0–46.0)
Hemoglobin: 10.2 g/dL — ABNORMAL LOW (ref 12.0–15.0)
O2 Saturation: 96 %
Potassium: 4.7 mmol/L (ref 3.5–5.1)
Sodium: 137 mmol/L (ref 135–145)
TCO2: 23 mmol/L (ref 22–32)
pCO2, Ven: 29.5 mmHg — ABNORMAL LOW (ref 44–60)
pH, Ven: 7.492 — ABNORMAL HIGH (ref 7.25–7.43)
pO2, Ven: 74 mmHg — ABNORMAL HIGH (ref 32–45)

## 2022-02-03 LAB — CBC WITH DIFFERENTIAL/PLATELET
Abs Immature Granulocytes: 0.01 10*3/uL (ref 0.00–0.07)
Basophils Absolute: 0 10*3/uL (ref 0.0–0.1)
Basophils Relative: 1 %
Eosinophils Absolute: 0.1 10*3/uL (ref 0.0–0.5)
Eosinophils Relative: 3 %
HCT: 32.3 % — ABNORMAL LOW (ref 36.0–46.0)
Hemoglobin: 10 g/dL — ABNORMAL LOW (ref 12.0–15.0)
Immature Granulocytes: 0 %
Lymphocytes Relative: 27 %
Lymphs Abs: 1.4 10*3/uL (ref 0.7–4.0)
MCH: 28.6 pg (ref 26.0–34.0)
MCHC: 31 g/dL (ref 30.0–36.0)
MCV: 92.3 fL (ref 80.0–100.0)
Monocytes Absolute: 0.5 10*3/uL (ref 0.1–1.0)
Monocytes Relative: 9 %
Neutro Abs: 3.1 10*3/uL (ref 1.7–7.7)
Neutrophils Relative %: 60 %
Platelets: 285 10*3/uL (ref 150–400)
RBC: 3.5 MIL/uL — ABNORMAL LOW (ref 3.87–5.11)
RDW: 15.9 % — ABNORMAL HIGH (ref 11.5–15.5)
WBC: 5.2 10*3/uL (ref 4.0–10.5)
nRBC: 0 % (ref 0.0–0.2)

## 2022-02-03 LAB — COMPREHENSIVE METABOLIC PANEL
ALT: 12 U/L (ref 0–44)
AST: 23 U/L (ref 15–41)
Albumin: 3.4 g/dL — ABNORMAL LOW (ref 3.5–5.0)
Alkaline Phosphatase: 119 U/L (ref 38–126)
Anion gap: 9 (ref 5–15)
BUN: 44 mg/dL — ABNORMAL HIGH (ref 8–23)
CO2: 25 mmol/L (ref 22–32)
Calcium: 9.2 mg/dL (ref 8.9–10.3)
Chloride: 103 mmol/L (ref 98–111)
Creatinine, Ser: 2.93 mg/dL — ABNORMAL HIGH (ref 0.44–1.00)
GFR, Estimated: 18 mL/min — ABNORMAL LOW (ref >60)
Glucose, Bld: 92 mg/dL (ref 70–99)
Potassium: 4.7 mmol/L (ref 3.5–5.1)
Sodium: 137 mmol/L (ref 135–145)
Total Bilirubin: 0.4 mg/dL (ref 0.3–1.2)
Total Protein: 6.6 g/dL (ref 6.5–8.1)

## 2022-02-03 LAB — RESP PANEL BY RT-PCR (RSV, FLU A&B, COVID)  RVPGX2
Influenza A by PCR: NEGATIVE
Influenza B by PCR: NEGATIVE
Resp Syncytial Virus by PCR: NEGATIVE
SARS Coronavirus 2 by RT PCR: NEGATIVE

## 2022-02-03 LAB — BRAIN NATRIURETIC PEPTIDE: B Natriuretic Peptide: 411 pg/mL — ABNORMAL HIGH (ref 0.0–100.0)

## 2022-02-03 LAB — GROUP A STREP BY PCR: Group A Strep by PCR: NOT DETECTED

## 2022-02-03 LAB — TROPONIN I (HIGH SENSITIVITY)
Troponin I (High Sensitivity): 19 ng/L — ABNORMAL HIGH (ref <18)
Troponin I (High Sensitivity): 17 ng/L (ref <18)

## 2022-02-03 LAB — AMMONIA: Ammonia: <10 umol/L (ref 9–35)

## 2022-02-03 MED ORDER — SODIUM CHLORIDE 0.9 % IV SOLN
1.0000 g | Freq: Once | INTRAVENOUS | Status: AC
  Administered 2022-02-04: 1 g via INTRAVENOUS
  Filled 2022-02-03: qty 10

## 2022-02-03 MED ORDER — SODIUM CHLORIDE 0.9 % IV SOLN
500.0000 mg | Freq: Once | INTRAVENOUS | Status: AC
  Administered 2022-02-04: 500 mg via INTRAVENOUS
  Filled 2022-02-03: qty 5

## 2022-02-03 NOTE — H&P (Addendum)
Hospital Admission History and Physical Service Pager: [phone]  Patient name: Tiffany [name] Medical record number: [id number] Date of Birth: 13-Jun-1960 Age: 62 y.o. Gender: female  Primary Care Provider: Leslie Dales, DO Consultants: none Code Status: Full Code  Preferred Emergency Contact:  Kieth Brightly More: [phone] - Her home aide  Chief Complaint: shortness of breath  Assessment and Plan: Tiffany [name] is a 62 y.o. female presenting with shortness of breath and leg swelling, with concern for CAP and CHF exacerbation. Has also had worsening renal function over the past few months. Differential diagnoses and management as below.  PMHx includes CAD s/p MI x2, CHF, HTN, HLD, CKD, schizophrenia/schizoaffective disorder, asthma/COPD, stroke, GERD  * Dyspnea P/w increasing SOB and reported O2 desaturation at home. On admission, afebrile, no leukocytosis, reassuringly not requiring supplemental O2. Has had a productive cough, ?hemoptysis vs rust-colored sputum. CXR with interstitial edema, CT chest with RUL opacity that could represent infection, inflammation, or possibly neoplasm. Given her cough, CT findings, and reported hypoxemia, CAP is a concern.  Also noted to have significant b/l lower extremity edema with hx of orthopnea. Takes torsemide at home. BNP 411, likely represents exacerbation of CHF (EF 55-60% in 6/23).   Her possible hemoptysis may reflect irritation due to coughing, underlying malignancy (1.8cm nodule noted in RUL), PE (less likely, not tachycardic or currently hypoxic, Wells 1) or infection like TB (less likely). COPD exacerbation is on the differential but less likely given evidence of CHF and likely CAP noted on imaging as above. No significant wheezing on exam. Unlikely ACS despite reported chest tightness; EKG without acute ischemic changes and troponin flat/downtrending. COVID/Flu/RSV neg Will require admission for further monitoring and  management.  -Admit to [facility], med-tele, attending Dr. McDiarmid -Continue IV CTX/AZT for presumed CAP -IV Lasix '60mg'$  x1, may redose in AM -Obtain echo -Cardiac monitoring -Strict I/O, daily weights -Sputum cx -Consider V/Q scan and/or pulmonology consult if no improvement or worsening respiratory status -Consider consult to heart failure team if worsening, especially given CKD -Likely needs f/u imaging in 3 months for lung nodule -PT/OT -AM BMP, Mg, CBC  CKD (chronic kidney disease) Cr on admission 2.93 (up from 2.48 in 12/23 and 1.28 in 9/23), may represent worsening of CKD vs AKI. Hgb to 10 which is stable for her, likely anemia of CKD. Taking torsemide at home, recently increased dosing due to leg swelling, this may be contributing. Given that she is fluid overloaded on exam with signs of acute CHF exacerbation, will provide IV diuresis with caution considering her CKD.  -Obtain renal U/S -Consider nephrology consult in AM for worsening CKD and ongoing diuresis -Diuresis as above -UA -AM BMP  Physical deconditioning Wheelchair bound at home.  -PT/OT   Chronic and stable:  HLD: Home Lipitor HTN: home prazosin (held amlodipine due to swelling) GERD: Protonix (on Prilosec at home) Schizophrenia: home Zyprexa, Saphris  COPD: home singuair    FEN/GI: Heart healthy/carb modified diet VTE Prophylaxis: lovenox  Disposition: med tele  History of Present Illness:  Tiffany [name] is a 62 y.o. female presenting with shortness of breath and cough  Reports worsening SOB for the past few days, with cough productive of dark red sputum for 2-3 days. Used to be on 6 L O2 at home, but has been off of this for a while. Hasn't been feeling sick or had any fevers. Reports sputum is dark red, located in phlegm, and then turned greyish color. Denies any sick contacts.  Appreciates swelling in legs, with pain, that has been an issue for last year, but has gotten worse within the last week.  Reports taking two torsemide daily. Also appreciates that she is congested and her chest feels tight. Denies any HA, CP, vomiting, or belly pain. Has been eating and drinking less. Appreciates that she is unable to lay flat in bed at home, because of breathing.   In the ED, CXR with ?interstitial edema, CT Chest with focal opacity in RUL and possible PAH. Started on IV Rocephin and azithromycin. Cr 2.93. Noted to have peripheral edema.  Review Of Systems: Per HPI   Pertinent Past Medical History: MI x 2 DM2 HTN - no meds today, yesterday (amlodipine and prazosin ) Remainder reviewed in history tab.   Pertinent Past Surgical History: Hysterectomy Navel removal   Remainder reviewed in history tab.  Pertinent Social History: Tobacco use: No, quit 5 years ago, 2 ppd, started at 62 yo.  Alcohol use: None Other Substance use: None Lives alone but has an aide 3 x a wk   Pertinent Family History: Cancer  - Daughter - Wilm's Tumor - Father - Lung Cancer DM HTN  Remainder reviewed in history tab.   Important Outpatient Medications: Olanzapine - Taken yesterday  Amlodipine - taken yesterday Prazosin - taken yesterday   Remainder reviewed in medication history.   Objective: BP (!) 170/77   Pulse 64   Temp 97.9 F (36.6 C) (Oral)   Resp 17   SpO2 97%  Exam: General: NAD, hoarse voice, responsive and pleasant Cardiovascular: RRR, no MRG, no significant JVD noted Respiratory: CTAB, normal WOB on RA, no crackles appreciated Gastrointestinal: soft NT/ND Ext: 3+ b/l lower extremity pitting edema to knees  Labs:  CBC BMET  Recent Labs  Lab 02/03/22 1426 02/03/22 1439  WBC 5.2  --   HGB 10.0* 10.2*  HCT 32.3* 30.0*  PLT 285  --    Recent Labs  Lab 02/03/22 1426 02/03/22 1439  NA 137 137  K 4.7 4.7  CL 103  --   CO2 25  --   BUN 44*  --   CREATININE 2.93*  --   GLUCOSE 92  --   CALCIUM 9.2  --      BNP 411 Ammonia <10 Troponin 19>17 GAS  negative COVID/Flu/RSV neg  EKG: NSR, no acute ST changes   Imaging Studies Performed:  CT Chest Wo Contrast  Result Date: 02/03/2022 CLINICAL DATA:  Hemoptysis. EXAM: CT CHEST WITHOUT CONTRAST TECHNIQUE: Multidetector CT imaging of the chest was performed following the standard protocol without IV contrast. RADIATION DOSE REDUCTION: This exam was performed according to the departmental dose-optimization program which includes automated exposure control, adjustment of the mA and/or kV according to patient size and/or use of iterative reconstruction technique. COMPARISON:  11/03/2016. FINDINGS: Cardiovascular: Heart is enlarged and there is a small pericardial effusion. The aorta is normal in caliber. The pulmonary trunk is distended suggesting underlying pulmonary artery hypertension. Mediastinum/Nodes: No mediastinal or axillary lymphadenopathy. Evaluation of the hilar regions is limited due to lack of IV contrast. Thyroid gland is not well seen due to streak artifact. There is narrowing of the mid to distal trachea, possibly related to expiratory phase. The esophagus is within normal limits. Lungs/Pleura: Atelectasis is noted at the lung bases. There is a focal opacity in the posterior segment of the right upper lobe measuring 1.8 x 1.5 cm, axial image 53. No effusion or pneumothorax Upper Abdomen: Stones are present in the  gallbladder. No acute abnormality. Musculoskeletal: Old healed rib fractures bilaterally. Degenerative changes in the thoracic spine. No acute or suspicious osseous abnormality. IMPRESSION: 1. Focal opacity measuring 1.8 cm in the posterior segment of the right upper lobe which may be infectious or inflammatory. The possibility of underlying neoplasm can not be excluded. Consider one of the following in 3 months for both low-risk and high-risk individuals: (a) repeat chest CT, (b) follow-up PET-CT, or (c) tissue sampling. This recommendation follows the consensus statement: Guidelines  for Management of Incidental Pulmonary Nodules Detected on CT Images: From the Fleischner Society 2017; Radiology 2017; 284:228-243. 2. Distended pulmonary trunk, suggesting underlying pulmonary artery hypertension. 3. Cardiomegaly. 4. Cholelithiasis. Electronically Signed   By: Brett Fairy M.D.   On: 02/03/2022 21:59   DG Chest 2 View  Result Date: 02/03/2022 CLINICAL DATA:  Provided history: Shortness of breath. EXAM: CHEST - 2 VIEW COMPARISON:  Prior chest radiographs 07/09/2021 and earlier. FINDINGS: Shallow inspiration radiograph. Cardiomegaly. Prominence of the interstitial lung markings, suggesting interstitial edema. No appreciable airspace consolidation. No evidence of pleural effusion or pneumothorax. No acute bony abnormality identified. Chronic, healed left-sided rib fracture deformities. IMPRESSION: 1. Shallow inspiration radiograph. 2. Cardiomegaly. 3. Prominence of the interstitial lung markings, suggesting interstitial edema. 4. No appreciable airspace consolidation. Electronically Signed   By: Kellie Simmering D.O.   On: 02/03/2022 15:19      August Albino, MD 02/04/2022, 1:02 AM PGY-1, [place] Intern pager: [phone], text pages welcome Secure chat group [place]    I was personally present and performed or re-performed the history, physical exam and medical decision making activities of this service and have verified that the service and findings are accurately documented in the student's note.  Holley Bouche, MD                  02/04/2022, 1:25 AM

## 2022-02-03 NOTE — ED Notes (Signed)
Patient transported to CT 

## 2022-02-03 NOTE — Assessment & Plan Note (Deleted)
CXR with interstitial edema, CT chest with RUL opacity that could represent infection, inflammation, or possibly neoplasm. Given her cough, CT findings, and reported hypoxemia, CAP is a concern.   Her possible hemoptysis may reflect irritation due to coughing, underlying malignancy (1.8cm nodule noted in RUL), PE (less likely, not tachycardic or currently hypoxic, Wells 1) or infection like TB (less likely). COPD exacerbation is on the differential but less likely given evidence of CHF and likely CAP noted on imaging as above. No significant wheezing on exam. Unlikely ACS despite reported chest tightness; EKG without acute ischemic changes and troponin flat/downtrending. COVID/Flu/RSV neg Will require admission for further monitoring and management.  -Admit to [facility], med-tele, attending Dr. McDiarmid -Continue IV CTX/AZT for presumed CAP -IV Lasix '60mg'$  x1, may redose in AM -Obtain echo -Cardiac monitoring -Strict I/O, daily weights -Sputum cx -Consider V/Q scan and/or pulmonology consult if no improvement or worsening respiratory status -Consider consult to heart failure team if worsening, especially given CKD -Likely needs f/u imaging in 3 months for lung nodule -PT/OT -AM BMP, Mg, CBC

## 2022-02-03 NOTE — ED Provider Notes (Signed)
[facility] Provider Note   CSN: [id number] Arrival date & time: 02/03/22  1322     History  No chief complaint on file.   Tiffany [name] is a 62 y.o. female.  Pt is a 62 yo with pmhx significant for anxiety, depression, copd, hld, cad, osa, cva, arthritis, chf, chronic pain, ptsd, and ckd.  Pt said she's been having increased sob.  [name] wheelchair bound, but said she's had desats down to the upper 80s.  She has had increased leg swelling despite taking torsemide.  She has been coughing up dark sputum.  She called the FP residents yesterday and they recommended that she come to the ED.  Today, pt said sx have worsened.  O2 sat 88-01% for home health, but have been 94-100% for EMS and while here.            Home Medications Prior to Admission medications   Medication Sig Start Date End Date Taking? Authorizing Provider  albuterol (VENTOLIN HFA) 108 (90 Base) MCG/ACT inhaler Inhale 2 puffs into the lungs every 6 (six) hours as needed for wheezing or shortness of breath. 01/05/22   Leslie Dales, DO  amLODipine (NORVASC) 5 MG tablet TAKE 1 TABLET (5 MG TOTAL) BY MOUTH DAILY. 08/26/21   [name], MD  Asenapine Maleate 10 MG SUBL Place 1 tablet (10 mg total) under the tongue 2 (two) times daily. 04/06/21   Shelly Coss, MD  atorvastatin (LIPITOR) 20 MG tablet Take 1 tablet (20 mg total) by mouth daily. 06/24/21   [name], MD  carvedilol (COREG) 6.25 MG tablet TAKE 1 TABLET(6.25 MG) BY MOUTH TWICE DAILY WITH A MEAL Patient taking differently: Take 6.25 mg by mouth 2 (two) times daily with a meal. 08/17/21   Newlin, Enobong, MD  colchicine 0.6 MG tablet TAKE 2 TABS (1.2 MG) ORALLY AT THE ONSET OF GOUT FLARE, MAY REPEAT 1 TAB (0.6 MG) 1 HOUR IF SYMPTOMS PERSIST Patient taking differently: Take 0.6-1.2 mg by mouth daily as needed (Onset of gout flare, may repeat one tablet in 1 hour if symptoms persist). 08/15/21   [name], MD  cyclobenzaprine (FLEXERIL) 10 MG tablet Take 10 mg by mouth 2 (two) times daily as needed for muscle spasms. 06/27/21   [provider]  diclofenac (VOLTAREN) 75 MG EC tablet TAKE 1 TABLET(75 MG) BY MOUTH TWICE DAILY 01/31/22   Leslie Dales, DO  diclofenac Sodium (VOLTAREN) 1 % GEL Apply 4 g topically 4 (four) times daily as needed (pain). 04/19/21   [name], MD  dicyclomine (BENTYL) 10 MG capsule Take 1 capsule (10 mg total) by mouth 3 (three) times daily before meals. 01/05/22   Leslie Dales, DO  escitalopram (LEXAPRO) 20 MG tablet Take 20 mg by mouth daily.    [provider]  febuxostat (ULORIC) 40 MG tablet Take 1 tablet (40 mg total) by mouth daily. 01/05/22   Leslie Dales, DO  ferrous sulfate 325 (65 FE) MG tablet Take 325 mg by mouth daily. 01/31/17   [provider]  Fluticasone-Umeclidin-Vilant (TRELEGY ELLIPTA) 100-62.5-25 MCG/ACT AEPB Inhale 1 puff into the lungs daily. 06/24/21   [name], MD  Fluticasone-Umeclidin-Vilant (TRELEGY ELLIPTA) 100-62.5-25 MCG/ACT AEPB Inhale 1 puff into the lungs daily. 01/05/22   Leslie Dales, DO  Iron, Ferrous Sulfate, 325 (65 Fe) MG TABS Take 325 mg by mouth daily. 06/24/21   [name], MD  lisdexamfetamine (VYVANSE) 10 MG capsule Take 10 mg by mouth  every morning.    [provider]  loperamide (IMODIUM) 2 MG capsule Take 2 capsules (4 mg total) by mouth 3 (three) times daily as needed for diarrhea or loose stools. 01/23/22   Leslie Dales, DO  methocarbamol (ROBAXIN) 500 MG tablet TAKE 1 TABLET(500 MG) BY MOUTH TWICE DAILY Patient taking differently: Take 500 mg by mouth in the morning and at bedtime. 08/01/21   [name], MD  methocarbamol (ROBAXIN) 500 MG tablet Take 500 mg by mouth in the morning and at bedtime. 08/26/21   [provider]  montelukast (SINGULAIR) 10 MG tablet TAKE 1 TABLET (10 MG TOTAL) BY MOUTH AT BEDTIME. 08/29/21   Newlin, Enobong, MD   montelukast (SINGULAIR) 10 MG tablet Take 10 mg by mouth at bedtime.    [provider]  OLANZapine (ZYPREXA) 5 MG tablet Take 5 mg by mouth daily. 08/26/21   [provider]  omeprazole (PRILOSEC) 40 MG capsule Take 1 capsule (40 mg total) by mouth every morning. 06/24/21   [name], MD  oxybutynin (DITROPAN) 5 MG tablet Take 1 tablet (5 mg total) by mouth 2 (two) times daily. 06/24/21   [name], MD  potassium chloride SA (KLOR-CON M) 20 MEQ tablet Take 40 mEq by mouth 2 (two) times daily.    [provider]  prazosin (MINIPRESS) 2 MG capsule Take 1 capsule (2 mg total) by mouth at bedtime. 01/05/22   Leslie Dales, DO  pregabalin (LYRICA) 75 MG capsule Take 1 capsule (75 mg total) by mouth 2 (two) times daily. 07/18/21 10/19/21  Delfin Gant, NP  torsemide (DEMADEX) 20 MG tablet TAKE 1 TABLET(20 MG) BY MOUTH DAILY Patient taking differently: Take 20 mg by mouth daily. 08/17/21   [name], MD      Allergies    Hydrocodone, Anette Guarneri [lurasidone hcl], Magnesium-containing compounds, Prednisone, Tramadol, Codeine, Trazodone, Peanut-containing drug products, Topamax [topiramate], Sulfa antibiotics, and Tape    Review of Systems   Review of Systems  Respiratory:  Positive for shortness of breath.   Cardiovascular:  Positive for leg swelling.  All other systems reviewed and are negative.   Physical Exam Updated Vital Signs BP (!) 181/101   Pulse 60   Temp 97.9 F (36.6 C) (Oral)   Resp 13   SpO2 98%  Physical Exam Vitals and nursing note reviewed.  Constitutional:      Appearance: Normal appearance. [name] obese.  HENT:     Head: Normocephalic and atraumatic.     Right Ear: External ear normal.     Left Ear: External ear normal.     Nose: Nose normal.     Mouth/Throat:     Mouth: Mucous membranes are moist.     Pharynx: Oropharynx is clear.  Eyes:     Extraocular Movements: Extraocular movements intact.     Pupils: Pupils are  equal, round, and reactive to light.  Cardiovascular:     Rate and Rhythm: Normal rate and regular rhythm.     Pulses: Normal pulses.     Heart sounds: Normal heart sounds.  Pulmonary:     Effort: Pulmonary effort is normal.     Breath sounds: Normal breath sounds.  Abdominal:     General: Abdomen is flat. Bowel sounds are normal.     Palpations: Abdomen is soft.  Musculoskeletal:        General: Normal range of motion.     Cervical back: Normal range of motion and neck supple.  Right lower leg: Edema present.     Left lower leg: Edema present.  Skin:    General: Skin is warm.     Capillary Refill: Capillary refill takes less than 2 seconds.  Neurological:     General: No focal deficit present.     Mental Status: [name] alert and oriented to person, place, and time.  Psychiatric:        Mood and Affect: Mood normal.        Behavior: Behavior normal.     ED Results / Procedures / Treatments   Labs (all labs ordered are listed, but only abnormal results are displayed) Labs Reviewed  COMPREHENSIVE METABOLIC PANEL - Abnormal; Notable for the following components:      Result Value   BUN 44 (*)    Creatinine, Ser 2.93 (*)    Albumin 3.4 (*)    GFR, Estimated 18 (*)    All other components within normal limits  BRAIN NATRIURETIC PEPTIDE - Abnormal; Notable for the following components:   B Natriuretic Peptide 411.0 (*)    All other components within normal limits  CBC WITH DIFFERENTIAL/PLATELET - Abnormal; Notable for the following components:   RBC 3.50 (*)    Hemoglobin 10.0 (*)    HCT 32.3 (*)    RDW 15.9 (*)    All other components within normal limits  I-STAT VENOUS BLOOD GAS, ED - Abnormal; Notable for the following components:   pH, Ven 7.492 (*)    pCO2, Ven 29.5 (*)    pO2, Ven 74 (*)    Calcium, Ion 1.07 (*)    HCT 30.0 (*)    Hemoglobin 10.2 (*)    All other components within normal limits  TROPONIN I (HIGH SENSITIVITY) - Abnormal; Notable for the  following components:   Troponin I (High Sensitivity) 19 (*)    All other components within normal limits  RESP PANEL BY RT-PCR (RSV, FLU A&B, COVID)  RVPGX2  GROUP A STREP BY PCR  AMMONIA  TROPONIN I (HIGH SENSITIVITY)    EKG EKG Interpretation  Date/Time:  Friday February 03 2022 14:30:30 EST Ventricular Rate:  61 PR Interval:  156 QRS Duration: 74 QT Interval:  460 QTC Calculation: 463 R Axis:   9 Text Interpretation: Normal sinus rhythm Anterior infarct , age undetermined Abnormal ECG When compared with ECG of 19-Sep-2021 14:58, PREVIOUS ECG IS PRESENT No significant change since last tracing Confirmed by Isla Pence [phone]) on 02/03/2022 10:48:05 PM  Radiology CT Chest Wo Contrast  Result Date: 02/03/2022 CLINICAL DATA:  Hemoptysis. EXAM: CT CHEST WITHOUT CONTRAST TECHNIQUE: Multidetector CT imaging of the chest was performed following the standard protocol without IV contrast. RADIATION DOSE REDUCTION: This exam was performed according to the departmental dose-optimization program which includes automated exposure control, adjustment of the mA and/or kV according to patient size and/or use of iterative reconstruction technique. COMPARISON:  11/03/2016. FINDINGS: Cardiovascular: Heart is enlarged and there is a small pericardial effusion. The aorta is normal in caliber. The pulmonary trunk is distended suggesting underlying pulmonary artery hypertension. Mediastinum/Nodes: No mediastinal or axillary lymphadenopathy. Evaluation of the hilar regions is limited due to lack of IV contrast. Thyroid gland is not well seen due to streak artifact. There is narrowing of the mid to distal trachea, possibly related to expiratory phase. The esophagus is within normal limits. Lungs/Pleura: Atelectasis is noted at the lung bases. There is a focal opacity in the posterior segment of the right upper lobe measuring 1.8 x  1.5 cm, axial image 53. No effusion or pneumothorax Upper Abdomen: Stones are  present in the gallbladder. No acute abnormality. Musculoskeletal: Old healed rib fractures bilaterally. Degenerative changes in the thoracic spine. No acute or suspicious osseous abnormality. IMPRESSION: 1. Focal opacity measuring 1.8 cm in the posterior segment of the right upper lobe which may be infectious or inflammatory. The possibility of underlying neoplasm can not be excluded. Consider one of the following in 3 months for both low-risk and high-risk individuals: (a) repeat chest CT, (b) follow-up PET-CT, or (c) tissue sampling. This recommendation follows the consensus statement: Guidelines for Management of Incidental Pulmonary Nodules Detected on CT Images: From the Fleischner Society 2017; Radiology 2017; 284:228-243. 2. Distended pulmonary trunk, suggesting underlying pulmonary artery hypertension. 3. Cardiomegaly. 4. Cholelithiasis. Electronically Signed   By: Brett Fairy M.D.   On: 02/03/2022 21:59   DG Chest 2 View  Result Date: 02/03/2022 CLINICAL DATA:  Provided history: Shortness of breath. EXAM: CHEST - 2 VIEW COMPARISON:  Prior chest radiographs 07/09/2021 and earlier. FINDINGS: Shallow inspiration radiograph. Cardiomegaly. Prominence of the interstitial lung markings, suggesting interstitial edema. No appreciable airspace consolidation. No evidence of pleural effusion or pneumothorax. No acute bony abnormality identified. Chronic, healed left-sided rib fracture deformities. IMPRESSION: 1. Shallow inspiration radiograph. 2. Cardiomegaly. 3. Prominence of the interstitial lung markings, suggesting interstitial edema. 4. No appreciable airspace consolidation. Electronically Signed   By: Kellie Simmering D.O.   On: 02/03/2022 15:19    Procedures Procedures    Medications Ordered in ED Medications  cefTRIAXone (ROCEPHIN) 1 g in sodium chloride 0.9 % 100 mL IVPB (has no administration in time range)  azithromycin (ZITHROMAX) 500 mg in sodium chloride 0.9 % 250 mL IVPB (has no  administration in time range)    ED Course/ Medical Decision Making/ A&P                             Medical Decision Making Amount and/or Complexity of Data Reviewed Radiology: ordered.  Risk Decision regarding hospitalization.   This patient presents to the ED for concern of sob, this involves an extensive number of treatment options, and is a complaint that carries with it a high risk of complications and morbidity.  The differential diagnosis includes pna, covid/flu, copd, electrolyte abn   Co morbidities that complicate the patient evaluation  anxiety, depression, copd, hld, cad, osa, cva, arthritis, chf, chronic pain, ptsd, and ckd  Additional history obtained:  Additional history obtained from epic chart review    Lab Tests:  I Ordered, and personally interpreted labs.  The pertinent results include:  cbc with hgb 10 (chronic); ; trop 17; vbg with ph 7.4, pco2 29.5; cmp with bun 44 and cr 2.93 (cr 2.48 in December and 1.28 in sept); covid/flu/rsv neg; strep neg   Imaging Studies ordered:  I ordered imaging studies including cxr  and ct I independently visualized and interpreted imaging which showed  CXR: . Shallow inspiration radiograph.  2. Cardiomegaly.  3. Prominence of the interstitial lung markings, suggesting  interstitial edema.  4. No appreciable airspace consolidation.   CT chest: 1. Focal opacity measuring 1.8 cm in the posterior segment of the  right upper lobe which may be infectious or inflammatory. The  possibility of underlying neoplasm can not be excluded. Consider one  of the following in 3 months for both low-risk and high-risk  individuals: (a) repeat chest CT, (b) follow-up PET-CT, or (c)  tissue  sampling. This recommendation follows the consensus  statement: Guidelines for Management of Incidental Pulmonary Nodules  Detected on CT Images: From the Fleischner Society 2017; Radiology  2017; 284:228-243.  2. Distended pulmonary trunk,  suggesting underlying pulmonary artery  hypertension.  3. Cardiomegaly.  4. Cholelithiasis.   I agree with the radiologist interpretation   Cardiac Monitoring:  The patient was maintained on a cardiac monitor.  I personally viewed and interpreted the cardiac monitored which showed an underlying rhythm of: nsr   Medicines ordered and prescription drug management:  I ordered medication including rocephin/zithromax  for pna  Reevaluation of the patient after these medicines showed that the patient improved I have reviewed the patients home medicines and have made adjustments as needed   Test Considered:  ct   Critical Interventions:  abx   Consultations Obtained:  I requested consultation with the [facility] residents,  and discussed lab and imaging findings as well as pertinent plan - they will admit  Problem List / ED Course:  CAP:  pt does have a cough with an opacity in the RUL.  Pt started on rocephin/zithromax.   Acute on chronic kidney disease:  kidney function worsening significantly since Sept and worse since Dec. BLE:  pt does have significant peripheral edema.   Reevaluation:  After the interventions noted above, I reevaluated the patient and found that they have :improved   Social Determinants of Health:  Lives at home   Dispostion:  After consideration of the diagnostic results and the patients response to treatment, I feel that the patent would benefit from admission.           Final Clinical Impression(s) / ED Diagnoses Final diagnoses:  Community acquired pneumonia of right upper lobe of lung  Peripheral edema  AKI (acute kidney injury) [facility])    Rx / [facility] Orders ED Discharge Orders     None         Isla Pence, MD 02/03/22 2326

## 2022-02-03 NOTE — Assessment & Plan Note (Addendum)
Cr 2.62 from 2.77 yesterday. baseline was 1.5 but Cr of 2.48 last month. Likely AKI and worsening of CKD. Taking torsemide at home, recently increased dosing due to leg swelling, this may be contributing. Renal US showing chronic kidney disease. No hydronephrosis. UA unremarkable.  -Diuresis as above -f/u BMP

## 2022-02-03 NOTE — ED Notes (Signed)
Called 3 x no answer

## 2022-02-03 NOTE — ED Provider Triage Note (Signed)
Emergency Medicine Provider Triage Evaluation Note  Tiffany [name] , a 62 y.o. female  was evaluated in triage.  Pt complains of chest pain, shortness of breath, dizziness, cough.  Patient states that symptoms been present over the past 2 days.  Difficult to obtain details of history secondary to patient dozing off in between questions.  Per EMS, patient 02 sat 88-91% on room air while sitting in chair on arrival and patient is on no oxygen at baseline.  Denies fever, chills, night sweats, abdominal pain, nausea, vomiting, urinary symptoms.  Review of Systems  Positive:  Negative: See above  Physical Exam  BP (!) 145/85 (BP Location: Right Arm)   Pulse 64   Temp 97.8 F (36.6 C) (Oral)   Resp 16   SpO2 91%  Gen:   Awake, no distress   Resp:  Normal effort  MSK:   Moves extremities without difficulty Other:    Medical Decision Making  Medically screening exam initiated at 2:19 PM.  Appropriate orders placed.  Tiffany [name] was informed that the remainder of the evaluation will be completed by another provider, this initial triage assessment does not replace that evaluation, and the importance of remaining in the ED until their evaluation is complete.     Tiffany [name], [place] 02/03/22 1420

## 2022-02-03 NOTE — ED Triage Notes (Signed)
Pt BIB [facility] from home d/t her [facility] calling EMS d/t pt having dizziness when she stood & being very lethargic. EMS reports that she is very somnolent until her blanket is taken off of her lap. Hx of taking too much of her meds O2 88-91% with home health on RA EMS 94-100% RA with EMS CBG 101 132/70 70 bpm

## 2022-02-04 ENCOUNTER — Observation Stay (HOSPITAL_COMMUNITY): Payer: 59

## 2022-02-04 DIAGNOSIS — N184 Chronic kidney disease, stage 4 (severe): Secondary | ICD-10-CM | POA: Diagnosis not present

## 2022-02-04 DIAGNOSIS — I5031 Acute diastolic (congestive) heart failure: Secondary | ICD-10-CM

## 2022-02-04 DIAGNOSIS — R5381 Other malaise: Secondary | ICD-10-CM

## 2022-02-04 DIAGNOSIS — I5033 Acute on chronic diastolic (congestive) heart failure: Secondary | ICD-10-CM

## 2022-02-04 DIAGNOSIS — N281 Cyst of kidney, acquired: Secondary | ICD-10-CM | POA: Diagnosis not present

## 2022-02-04 DIAGNOSIS — N179 Acute kidney failure, unspecified: Secondary | ICD-10-CM | POA: Diagnosis not present

## 2022-02-04 HISTORY — DX: Acute on chronic diastolic (congestive) heart failure: I50.33

## 2022-02-04 LAB — BASIC METABOLIC PANEL
Anion gap: 7 (ref 5–15)
BUN: 43 mg/dL — ABNORMAL HIGH (ref 8–23)
CO2: 24 mmol/L (ref 22–32)
Calcium: 8.1 mg/dL — ABNORMAL LOW (ref 8.9–10.3)
Chloride: 108 mmol/L (ref 98–111)
Creatinine, Ser: 2.9 mg/dL — ABNORMAL HIGH (ref 0.44–1.00)
GFR, Estimated: 18 mL/min — ABNORMAL LOW (ref >60)
Glucose, Bld: 116 mg/dL — ABNORMAL HIGH (ref 70–99)
Potassium: 4.4 mmol/L (ref 3.5–5.1)
Sodium: 139 mmol/L (ref 135–145)

## 2022-02-04 LAB — CBC
HCT: 25.9 % — ABNORMAL LOW (ref 36.0–46.0)
Hemoglobin: 8.2 g/dL — ABNORMAL LOW (ref 12.0–15.0)
MCH: 29.2 pg (ref 26.0–34.0)
MCHC: 31.7 g/dL (ref 30.0–36.0)
MCV: 92.2 fL (ref 80.0–100.0)
Platelets: 256 10*3/uL (ref 150–400)
RBC: 2.81 MIL/uL — ABNORMAL LOW (ref 3.87–5.11)
RDW: 16.2 % — ABNORMAL HIGH (ref 11.5–15.5)
WBC: 4 10*3/uL (ref 4.0–10.5)
nRBC: 0 % (ref 0.0–0.2)

## 2022-02-04 LAB — ECHOCARDIOGRAM COMPLETE
AR max vel: 2.08 cm2
AV Area VTI: 1.91 cm2
AV Area mean vel: 1.71 cm2
AV Mean grad: 9 mmHg
AV Peak grad: 17.1 mmHg
Ao pk vel: 2.07 m/s
Area-P 1/2: 3.72 cm2
S' Lateral: 3.4 cm

## 2022-02-04 LAB — MAGNESIUM: Magnesium: 2.2 mg/dL (ref 1.7–2.4)

## 2022-02-04 LAB — PROCALCITONIN: Procalcitonin: 0.1 ng/mL

## 2022-02-04 LAB — GLUCOSE, CAPILLARY: Glucose-Capillary: 72 mg/dL (ref 70–99)

## 2022-02-04 LAB — HIV ANTIBODY (ROUTINE TESTING W REFLEX): HIV Screen 4th Generation wRfx: NONREACTIVE

## 2022-02-04 MED ORDER — ATORVASTATIN CALCIUM 10 MG PO TABS
20.0000 mg | ORAL_TABLET | Freq: Every day | ORAL | Status: DC
  Administered 2022-02-04 – 2022-02-07 (×4): 20 mg via ORAL
  Filled 2022-02-04 (×5): qty 2

## 2022-02-04 MED ORDER — PRAZOSIN HCL 2 MG PO CAPS
2.0000 mg | ORAL_CAPSULE | Freq: Every day | ORAL | Status: DC
  Administered 2022-02-04 – 2022-02-06 (×3): 2 mg via ORAL
  Filled 2022-02-04 (×6): qty 1

## 2022-02-04 MED ORDER — SODIUM CHLORIDE 0.9 % IV SOLN
500.0000 mg | INTRAVENOUS | Status: DC
  Filled 2022-02-04: qty 5

## 2022-02-04 MED ORDER — SODIUM CHLORIDE 0.9 % IV SOLN: 1.0000 g | INTRAVENOUS | Status: DC

## 2022-02-04 MED ORDER — PANTOPRAZOLE SODIUM 40 MG PO TBEC
40.0000 mg | DELAYED_RELEASE_TABLET | Freq: Every day | ORAL | Status: DC
  Administered 2022-02-04 – 2022-02-07 (×4): 40 mg via ORAL
  Filled 2022-02-04 (×4): qty 1

## 2022-02-04 MED ORDER — ASENAPINE MALEATE 5 MG SL SUBL
5.0000 mg | SUBLINGUAL_TABLET | Freq: Two times a day (BID) | SUBLINGUAL | Status: DC
  Administered 2022-02-04 – 2022-02-07 (×7): 5 mg via SUBLINGUAL
  Filled 2022-02-04 (×11): qty 1

## 2022-02-04 MED ORDER — AYR SALINE NASAL NA GEL
1.0000 | Freq: Every day | NASAL | Status: DC
  Administered 2022-02-04 – 2022-02-06 (×3): 1 via NASAL
  Filled 2022-02-04: qty 14.1

## 2022-02-04 MED ORDER — SALINE SPRAY 0.65 % NA SOLN
1.0000 | NASAL | Status: DC | PRN
  Administered 2022-02-07: 1 via NASAL
  Filled 2022-02-04 (×2): qty 44

## 2022-02-04 MED ORDER — POLYETHYLENE GLYCOL 3350 17 G PO PACK
17.0000 g | PACK | Freq: Every day | ORAL | Status: DC
  Administered 2022-02-04 – 2022-02-05 (×2): 17 g via ORAL
  Filled 2022-02-04 (×3): qty 1

## 2022-02-04 MED ORDER — LIDOCAINE 5 % EX PTCH
1.0000 | MEDICATED_PATCH | CUTANEOUS | Status: DC
  Administered 2022-02-04 – 2022-02-06 (×3): 1 via TRANSDERMAL
  Filled 2022-02-04 (×3): qty 1

## 2022-02-04 MED ORDER — FUROSEMIDE 10 MG/ML IJ SOLN
60.0000 mg | Freq: Once | INTRAMUSCULAR | Status: AC
  Administered 2022-02-04: 60 mg via INTRAVENOUS
  Filled 2022-02-04: qty 6

## 2022-02-04 MED ORDER — ENOXAPARIN SODIUM 30 MG/0.3ML IJ SOSY
30.0000 mg | PREFILLED_SYRINGE | INTRAMUSCULAR | Status: DC
  Administered 2022-02-04 – 2022-02-07 (×4): 30 mg via SUBCUTANEOUS
  Filled 2022-02-04 (×4): qty 0.3

## 2022-02-04 MED ORDER — DICYCLOMINE HCL 10 MG PO CAPS
10.0000 mg | ORAL_CAPSULE | Freq: Three times a day (TID) | ORAL | Status: DC
  Administered 2022-02-05 – 2022-02-07 (×7): 10 mg via ORAL
  Filled 2022-02-04 (×8): qty 1

## 2022-02-04 MED ORDER — OLANZAPINE 5 MG PO TABS
5.0000 mg | ORAL_TABLET | Freq: Every day | ORAL | Status: DC
  Administered 2022-02-04 – 2022-02-07 (×4): 5 mg via ORAL
  Filled 2022-02-04 (×4): qty 1

## 2022-02-04 MED ORDER — DULOXETINE HCL 30 MG PO CPEP
30.0000 mg | ORAL_CAPSULE | Freq: Every day | ORAL | Status: DC
  Administered 2022-02-04 – 2022-02-07 (×4): 30 mg via ORAL
  Filled 2022-02-04 (×4): qty 1

## 2022-02-04 MED ORDER — MONTELUKAST SODIUM 10 MG PO TABS
10.0000 mg | ORAL_TABLET | Freq: Every day | ORAL | Status: DC
  Administered 2022-02-04 – 2022-02-06 (×3): 10 mg via ORAL
  Filled 2022-02-04 (×3): qty 1

## 2022-02-04 MED ORDER — ORAL CARE MOUTH RINSE: 15.0000 mL | OROMUCOSAL | Status: DC | PRN

## 2022-02-04 NOTE — Assessment & Plan Note (Addendum)
Does have COPD and OSA overlap syndrome on CPAP at night per chart review. CT chest with RUL opacity that could represent infection, inflammation, or possibly neoplasm.  Off abx. -Monitor for fever -f/u sputum cx - repeat imaging in 3 months to follow RUL opacity  - cpap at night - f/u CBC

## 2022-02-04 NOTE — ED Notes (Signed)
This RN assumed care of patient. Pt arrived to ED presenting with SOB and bilateral lower leg edema. Pt is afebrile & A&Ox4, NAD noted at this time. Pt presents with spontaneous breathing, equal bilaterally, and speaks full sentences without difficulty. Pt skin tone is appropriate for ethnicity, dry and warm. Pt connected to CCM, pulse ox and BP.

## 2022-02-04 NOTE — ED Notes (Signed)
[name] at bedside for second attempt.

## 2022-02-04 NOTE — Progress Notes (Signed)
PT Cancellation Note  Patient Details Name: Tiffany [name] MRN: [id number] DOB: 1960-12-25   Cancelled Treatment:    Reason Eval/Treat Not Completed: Other (comment).  Pt is quite lethargic and cannot follow directions.  Follow up at another time.   Ramond Dial 02/04/2022, 12:20 PM  Mee Hives, PT PhD Acute Rehab Dept. Number: [facility] [place]

## 2022-02-04 NOTE — ED Notes (Signed)
ED TO INPATIENT HANDOFF REPORT  ED Nurse Name and Phone #: Duanne Guess RN [phone]  S Name/Age/Gender Tiffany [name] 62 y.o. female Room/Bed: 036C/036C  Code Status   Code Status: Full Code   Triage Complete: Triage complete  Chief Complaint Pneumonia [J18.9]  Triage Note Pt BIB [facility] from home d/t her [facility] calling EMS d/t pt having dizziness when she stood & being very lethargic. EMS reports that she is very somnolent until her blanket is taken off of her lap. Hx of taking too much of her meds O2 88-91% with home health on RA EMS 94-100% RA with EMS CBG 101 132/70 70 bpm    Allergies Allergies  Allergen Reactions   Hydrocodone Shortness Of Breath   Latuda [Lurasidone Hcl] Anaphylaxis   Magnesium-Containing Compounds Anaphylaxis and Other (See Comments)    Tolerated Ensure   Prednisone Anaphylaxis, Swelling and Other (See Comments)    Tongue swelling, lip swelling, throat swelling, per pt    Tramadol Anaphylaxis, Swelling and Rash   Codeine Nausea And Vomiting and Rash   Trazodone Other (See Comments)    paranoia   Peanut-Containing Drug Products Hives    Raw peanuts, but can eat peanut butter   Topamax [Topiramate] Other (See Comments)    Increases paranoia   Sulfa Antibiotics Itching   Tape Rash    Level of Care/Admitting Diagnosis ED Disposition     ED Disposition  Admit   Condition  --   Comment  Hospital Area: [facility] [[id number]]  Level of Care: Telemetry Cardiac [[id number]]  May place patient in observation at [facility] or [facility] if equivalent level of care is available:: No  Covid Evaluation: Confirmed COVID Negative  Diagnosis: Pneumonia [454098]  Admitting Physician: Rickey Primus  Attending Physician: Wendy Poet, TODD D Stacy.Beals          B Medical/Surgery History Past Medical History:  Diagnosis Date   Agitation 11/22/2017   Anoxic brain injury ([place]) 09/08/2016   C. Arrest due to respiratory failure  and COPD exacerbation   Anxiety    Arthritis    "all over" (04/10/2016)   Asthma 10/18/2010   Binge eating disorder    Cardiac arrest ([place]) 09/08/2016   PEA   Carotid artery stenosis    1-39% bilateral by dopplers 11/2016   Chronic diastolic (congestive) heart failure (HCC)    Chronic kidney disease, stage 3 (HCC)    Chronic pain syndrome 06/18/2012   Chronic post-traumatic stress disorder (PTSD) 05/27/2018   Chronic respiratory failure with hypoxia and hypercapnia ([place]) 06/22/2015   TRILOGY Vent >AVAPA-ES., Vt target 200-400, Max P 30 , PS max 20 , PS min 6-10 , E Max 6, E Min 4, Rate Auto AVAPS Rate 2 (titrate for pt comfort) , bleed O2 at 5l/m continuous flow .    CKD (chronic kidney disease) 02/03/2022   Closed displaced fracture of fifth metacarpal bone 03/21/2018   Cocaine use disorder, severe, in sustained remission ([place]) 11/91/4782   Complication of anesthesia    decreased bp, decreased heart rate   COPD (chronic obstructive pulmonary disease) ([place]) 07/08/2014   Depression    Diabetic neuropathy ([place]) 04/24/2011   Difficulty with speech 01/24/2018   Disorder of nervous system    Drug abuse ([place]) 11/21/2017   Dyslipidemia 04/24/2011   Elevated troponin 04/28/2012   Emphysema    Encephalopathy 11/21/2017   Essential hypertension 03/22/2016   Fibula fracture 07/10/2016   Frequent falls 10/11/2017   GERD (  gastroesophageal reflux disease)    Gout 04/11/2017   Heart attack ([place]) 1980s   History of blood transfusion 1994   "couldn't stop bleeding from my period"   History of drug abuse in remission ([place]) 11/28/2015   Quit in 2017   Hyperlipidemia LDL goal <70    Incontinence    Manic depression ([place])    Morbid obesity ([place]) 10/18/2010   Obstructive sleep apnea 10/18/2010   On home oxygen therapy    "6L; 24/7" (04/10/2016)   OSA on CPAP    "wear mask sometimes" (04/10/2016)   Paranoid ([place])    "sometimes; I'm on RX for it" (04/10/2016)   Prolonged Q-T interval on ECG     Rectal bleeding 12/31/2015   Schizoaffective disorder, bipolar type ([place]) 04/05/2018   Seasonal allergies    Seborrheic keratoses 12/31/2013   Seizures ([place])    "don't know what kind; last one was ~ 1 yr ago" (04/10/2016)   Sinus bradycardia    Stroke [facility]) 1980s   denies residual on 04/10/2016   Thrush 09/19/2013   Type 2 diabetes mellitus ([place]) 10/18/2010   Past Surgical History:  Procedure Laterality Date   CESAREAN SECTION  1997   COLONOSCOPY WITH PROPOFOL N/A 04/01/2021   Procedure: COLONOSCOPY WITH PROPOFOL;  Surgeon: Carol Ada, MD;  Location: [place];  Service: Gastroenterology;  Laterality: N/A;  Rectal bleeding with drop in hemoglobin to 7.2 g/dL   HERNIA REPAIR     IR CHOLANGIOGRAM EXISTING TUBE  07/20/2016   IR PERC CHOLECYSTOSTOMY  05/10/2016   IR RADIOLOGIST EVAL & MGMT  06/08/2016   IR RADIOLOGIST EVAL & MGMT  06/29/2016   IR SINUS/FIST TUBE CHK-NON GI  07/12/2016   RIGHT/LEFT HEART CATH AND CORONARY ANGIOGRAPHY N/A 06/19/2017   Procedure: RIGHT/LEFT HEART CATH AND CORONARY ANGIOGRAPHY;  Surgeon: Jolaine Artist, MD;  Location: [facility];  Service: Cardiovascular;  Laterality: N/A;   TIBIA IM NAIL INSERTION Right 07/12/2016   Procedure: INTRAMEDULLARY (IM) NAIL RIGHT TIBIA;  Surgeon: Leandrew Koyanagi, MD;  Location: [facility] [facility];  Service: Orthopedics;  Laterality: Right;   UMBILICAL HERNIA REPAIR  ~ 1963   "that's why I don't have a belly button"   VAGINAL HYSTERECTOMY       A IV Location/Drains/Wounds Patient Lines/Drains/Airways Status     Active Line/Drains/Airways     Name Placement date Placement time Site Days   Peripheral IV 02/04/22 22 G 2.5" Anterior;Right Forearm 02/04/22  0038  Forearm  less than 1   Wound / Incision (Open or Dehisced) 05/11/20 Heel Left open skin, pink and no drainage 05/11/20  --  Heel  634            Intake/Output Last 24 hours  Intake/Output Summary (Last 24 hours) at 02/04/2022 1205 Last data filed at 02/04/2022  0113 Gross per 24 hour  Intake --  Output 1100 ml  Net -1100 ml    Labs/Imaging Results for orders placed or performed during the hospital encounter of 02/03/22 (from the past 48 hour(s))  Brain natriuretic peptide     Status: Abnormal   Collection Time: 02/03/22  2:19 PM  Result Value Ref Range   B Natriuretic Peptide 411.0 (H) 0.0 - 100.0 pg/mL    Comment: Performed at [facility], [address] [place], [place] [address]  Ammonia     Status: None   Collection Time: 02/03/22  2:19 PM  Result Value Ref Range   Ammonia <10 9 - 35  umol/L    Comment: Performed at [facility], [place] [address], [place], [place] [address]  Troponin I (High Sensitivity)     Status: Abnormal   Collection Time: 02/03/22  2:26 PM  Result Value Ref Range   Troponin I (High Sensitivity) 19 (H) <18 ng/L    Comment: (NOTE) Elevated high sensitivity troponin I (hsTnI) values and significant  changes across serial measurements may suggest ACS but many other  chronic and acute conditions are known to elevate hsTnI results.  Refer to the "Links" section for chest pain algorithms and additional  guidance. Performed at [facility], [place] [address] [place], [place] [address]   Comprehensive metabolic panel     Status: Abnormal   Collection Time: 02/03/22  2:26 PM  Result Value Ref Range   Sodium 137 135 - 145 mmol/L   Potassium 4.7 3.5 - 5.1 mmol/L   Chloride 103 98 - 111 mmol/L   CO2 25 22 - 32 mmol/L   Glucose, Bld 92 70 - 99 mg/dL    Comment: Glucose reference range applies only to samples taken after fasting for at least 8 hours.   BUN 44 (H) 8 - 23 mg/dL   Creatinine, Ser 2.93 (H) 0.44 - 1.00 mg/dL   Calcium 9.2 8.9 - 10.3 mg/dL   Total Protein 6.6 6.5 - 8.1 g/dL   Albumin 3.4 (L) 3.5 - 5.0 g/dL   AST 23 15 - 41 U/L   ALT 12 0 - 44 U/L   Alkaline Phosphatase 119 38 - 126 U/L   Total Bilirubin 0.4 0.3 - 1.2 mg/dL   GFR, Estimated 18 (L) >60 mL/min    Comment:  (NOTE) Calculated using the CKD-EPI Creatinine Equation (2021)    Anion gap 9 5 - 15    Comment: Performed at [facility] [address], [place], [place] [address]  CBC with Differential     Status: Abnormal   Collection Time: 02/03/22  2:26 PM  Result Value Ref Range   WBC 5.2 4.0 - 10.5 K/uL   RBC 3.50 (L) 3.87 - 5.11 MIL/uL   Hemoglobin 10.0 (L) 12.0 - 15.0 g/dL   HCT 32.3 (L) 36.0 - 46.0 %   MCV 92.3 80.0 - 100.0 fL   MCH 28.6 26.0 - 34.0 pg   MCHC 31.0 30.0 - 36.0 g/dL   RDW 15.9 (H) 11.5 - 15.5 %   Platelets 285 150 - 400 K/uL   nRBC 0.0 0.0 - 0.2 %   Neutrophils Relative % 60 %   Neutro Abs 3.1 1.7 - 7.7 K/uL   Lymphocytes Relative 27 %   Lymphs Abs 1.4 0.7 - 4.0 K/uL   Monocytes Relative 9 %   Monocytes Absolute 0.5 0.1 - 1.0 K/uL   Eosinophils Relative 3 %   Eosinophils Absolute 0.1 0.0 - 0.5 K/uL   Basophils Relative 1 %   Basophils Absolute 0.0 0.0 - 0.1 K/uL   Immature Granulocytes 0 %   Abs Immature Granulocytes 0.01 0.00 - 0.07 K/uL    Comment: Performed at [facility], [address] [place], [place] [address]  I-Stat venous blood gas, ED     Status: Abnormal   Collection Time: 02/03/22  2:39 PM  Result Value Ref Range   pH, Ven 7.492 (H) 7.25 - 7.43   pCO2, Ven 29.5 (L) 44 - 60 mmHg   pO2, Ven 74 (H) 32 - 45 mmHg   Bicarbonate 22.6 20.0 -  28.0 mmol/L   TCO2 23 22 - 32 mmol/L   O2 Saturation 96 %   Acid-Base Excess 0.0 0.0 - 2.0 mmol/L   Sodium 137 135 - 145 mmol/L   Potassium 4.7 3.5 - 5.1 mmol/L   Calcium, Ion 1.07 (L) 1.15 - 1.40 mmol/L   HCT 30.0 (L) 36.0 - 46.0 %   Hemoglobin 10.2 (L) 12.0 - 15.0 g/dL   Sample type VENOUS   Troponin I (High Sensitivity)     Status: None   Collection Time: 02/03/22  5:10 PM  Result Value Ref Range   Troponin I (High Sensitivity) 17 <18 ng/L    Comment: (NOTE) Elevated high sensitivity troponin I (hsTnI) values and significant  changes across serial measurements may suggest ACS but many other   chronic and acute conditions are known to elevate hsTnI results.  Refer to the "Links" section for chest pain algorithms and additional  guidance. Performed at [facility], [place] [address] [place], [place] [address]   Group A Strep by PCR     Status: None   Collection Time: 02/03/22  9:08 PM   Specimen: Anterior Nasal Swab; Sterile Swab  Result Value Ref Range   Group A Strep by PCR NOT DETECTED NOT DETECTED    Comment: Performed at [facility] [address], [place], [place] [address]  Resp panel by RT-PCR (RSV, Flu A&B, Covid) Anterior Nasal Swab     Status: None   Collection Time: 02/03/22  9:45 PM   Specimen: Anterior Nasal Swab  Result Value Ref Range   SARS Coronavirus 2 by RT PCR NEGATIVE NEGATIVE    Comment: (NOTE) SARS-CoV-2 target nucleic acids are NOT DETECTED.  The SARS-CoV-2 RNA is generally detectable in upper respiratory specimens during the acute phase of infection. The lowest concentration of SARS-CoV-2 viral copies this assay can detect is 138 copies/mL. A negative result does not preclude SARS-Cov-2 infection and should not be used as the sole basis for treatment or other patient management decisions. A negative result may occur with  improper specimen collection/handling, submission of specimen other than nasopharyngeal swab, presence of viral mutation(s) within the areas targeted by this assay, and inadequate number of viral copies(<138 copies/mL). A negative result must be combined with clinical observations, patient history, and epidemiological information. The expected result is Negative.  Fact Sheet for Patients:  EntrepreneurPulse.com.au  Fact Sheet for Healthcare Providers:  IncredibleEmployment.be  This test is no t yet approved or cleared by the [place] FDA and  has been authorized for detection and/or diagnosis of SARS-CoV-2 by FDA under an Emergency Use Authorization (EUA). This EUA will  remain  in effect (meaning this test can be used) for the duration of the COVID-19 declaration under Section 564(b)(1) of the Act, 21 U.S.C.section 360bbb-3(b)(1), unless the authorization is terminated  or revoked sooner.       Influenza A by PCR NEGATIVE NEGATIVE   Influenza B by PCR NEGATIVE NEGATIVE    Comment: (NOTE) The Xpert Xpress SARS-CoV-2/FLU/RSV plus assay is intended as an aid in the diagnosis of influenza from Nasopharyngeal swab specimens and should not be used as a sole basis for treatment. Nasal washings and aspirates are unacceptable for Xpert Xpress SARS-CoV-2/FLU/RSV testing.  Fact Sheet for Patients: EntrepreneurPulse.com.au  Fact Sheet for Healthcare Providers: IncredibleEmployment.be  This test is not yet approved or cleared by the [place] FDA and has been authorized for detection and/or diagnosis of SARS-CoV-2 by FDA under an Emergency Use  Authorization (EUA). This EUA will remain in effect (meaning this test can be used) for the duration of the COVID-19 declaration under Section 564(b)(1) of the Act, 21 U.S.C. section 360bbb-3(b)(1), unless the authorization is terminated or revoked.     Resp Syncytial Virus by PCR NEGATIVE NEGATIVE    Comment: (NOTE) Fact Sheet for Patients: EntrepreneurPulse.com.au  Fact Sheet for Healthcare Providers: IncredibleEmployment.be  This test is not yet approved or cleared by the [place] FDA and has been authorized for detection and/or diagnosis of SARS-CoV-2 by FDA under an Emergency Use Authorization (EUA). This EUA will remain in effect (meaning this test can be used) for the duration of the COVID-19 declaration under Section 564(b)(1) of the Act, 21 U.S.C. section 360bbb-3(b)(1), unless the authorization is terminated or revoked.  Performed at [facility], [place] [address] [place], [place] [address]   Basic metabolic  panel     Status: Abnormal   Collection Time: 02/04/22  5:37 AM  Result Value Ref Range   Sodium 139 135 - 145 mmol/L   Potassium 4.4 3.5 - 5.1 mmol/L   Chloride 108 98 - 111 mmol/L   CO2 24 22 - 32 mmol/L   Glucose, Bld 116 (H) 70 - 99 mg/dL    Comment: Glucose reference range applies only to samples taken after fasting for at least 8 hours.   BUN 43 (H) 8 - 23 mg/dL   Creatinine, Ser 2.90 (H) 0.44 - 1.00 mg/dL   Calcium 8.1 (L) 8.9 - 10.3 mg/dL   GFR, Estimated 18 (L) >60 mL/min    Comment: (NOTE) Calculated using the CKD-EPI Creatinine Equation (2021)    Anion gap 7 5 - 15    Comment: Performed at [facility] [address], [place], [place] [address]  CBC     Status: Abnormal   Collection Time: 02/04/22  5:37 AM  Result Value Ref Range   WBC 4.0 4.0 - 10.5 K/uL   RBC 2.81 (L) 3.87 - 5.11 MIL/uL   Hemoglobin 8.2 (L) 12.0 - 15.0 g/dL   HCT 25.9 (L) 36.0 - 46.0 %   MCV 92.2 80.0 - 100.0 fL   MCH 29.2 26.0 - 34.0 pg   MCHC 31.7 30.0 - 36.0 g/dL   RDW 16.2 (H) 11.5 - 15.5 %   Platelets 256 150 - 400 K/uL   nRBC 0.0 0.0 - 0.2 %    Comment: Performed at [facility], [place] [address] [place], [place] [address]  Magnesium     Status: None   Collection Time: 02/04/22  5:37 AM  Result Value Ref Range   Magnesium 2.2 1.7 - 2.4 mg/dL    Comment: Performed at [facility] [address], [place], [place] [address]   *Note: Due to a large number of results and/or encounters for the requested time period, some results have not been displayed. A complete set of results can be found in Results Review.   US RENAL  Result Date: 02/04/2022 CLINICAL DATA:  [id number] AKI (acute kidney injury) ([place]) [id number] EXAM: RENAL / URINARY TRACT ULTRASOUND COMPLETE COMPARISON:  02/18/2020 FINDINGS: Right Kidney: Renal measurements: 10.1 x 3.7 x 4.8 cm = volume: 95 mL. Diffusely increased echotexture. No mass or hydronephrosis. Left Kidney: Renal measurements: 9.5 x 4.6 x 5.1 cm = volume:  116 mL. 1 cm midpole cyst. Increased echotexture. No hydronephrosis. Bladder: Appears normal for degree of bladder distention. Other: None. IMPRESSION: Increased echotexture compatible with chronic medical  renal disease. No hydronephrosis. Electronically Signed   By: Rolm Baptise M.D.   On: 02/04/2022 09:33   CT Chest Wo Contrast  Result Date: 02/03/2022 CLINICAL DATA:  Hemoptysis. EXAM: CT CHEST WITHOUT CONTRAST TECHNIQUE: Multidetector CT imaging of the chest was performed following the standard protocol without IV contrast. RADIATION DOSE REDUCTION: This exam was performed according to the departmental dose-optimization program which includes automated exposure control, adjustment of the mA and/or kV according to patient size and/or use of iterative reconstruction technique. COMPARISON:  11/03/2016. FINDINGS: Cardiovascular: Heart is enlarged and there is a small pericardial effusion. The aorta is normal in caliber. The pulmonary trunk is distended suggesting underlying pulmonary artery hypertension. Mediastinum/Nodes: No mediastinal or axillary lymphadenopathy. Evaluation of the hilar regions is limited due to lack of IV contrast. Thyroid gland is not well seen due to streak artifact. There is narrowing of the mid to distal trachea, possibly related to expiratory phase. The esophagus is within normal limits. Lungs/Pleura: Atelectasis is noted at the lung bases. There is a focal opacity in the posterior segment of the right upper lobe measuring 1.8 x 1.5 cm, axial image 53. No effusion or pneumothorax Upper Abdomen: Stones are present in the gallbladder. No acute abnormality. Musculoskeletal: Old healed rib fractures bilaterally. Degenerative changes in the thoracic spine. No acute or suspicious osseous abnormality. IMPRESSION: 1. Focal opacity measuring 1.8 cm in the posterior segment of the right upper lobe which may be infectious or inflammatory. The possibility of underlying neoplasm can not be excluded.  Consider one of the following in 3 months for both low-risk and high-risk individuals: (a) repeat chest CT, (b) follow-up PET-CT, or (c) tissue sampling. This recommendation follows the consensus statement: Guidelines for Management of Incidental Pulmonary Nodules Detected on CT Images: From the Fleischner Society 2017; Radiology 2017; 284:228-243. 2. Distended pulmonary trunk, suggesting underlying pulmonary artery hypertension. 3. Cardiomegaly. 4. Cholelithiasis. Electronically Signed   By: Brett Fairy M.D.   On: 02/03/2022 21:59   DG Chest 2 View  Result Date: 02/03/2022 CLINICAL DATA:  Provided history: Shortness of breath. EXAM: CHEST - 2 VIEW COMPARISON:  Prior chest radiographs 07/09/2021 and earlier. FINDINGS: Shallow inspiration radiograph. Cardiomegaly. Prominence of the interstitial lung markings, suggesting interstitial edema. No appreciable airspace consolidation. No evidence of pleural effusion or pneumothorax. No acute bony abnormality identified. Chronic, healed left-sided rib fracture deformities. IMPRESSION: 1. Shallow inspiration radiograph. 2. Cardiomegaly. 3. Prominence of the interstitial lung markings, suggesting interstitial edema. 4. No appreciable airspace consolidation. Electronically Signed   By: Kellie Simmering D.O.   On: 02/03/2022 15:19    Pending Labs Unresulted Labs (From admission, onward)     Start     Ordered   02/05/22 0500  CBC  Tomorrow morning,   R        02/04/22 1153   02/05/22 7253  Basic metabolic panel  Tomorrow morning,   R        02/04/22 1153   02/04/22 1046  Procalcitonin - Baseline  ONCE - URGENT,   URGENT        02/04/22 1046   02/04/22 0500  Urinalysis, Routine w reflex microscopic  Once,   R        02/04/22 0027   02/04/22 0016  Expectorated Sputum Assessment w Gram Stain, Rflx to Resp Cult  Once,   R        02/04/22 0027   02/04/22 0014  HIV Antibody (routine testing w rflx)  (HIV Antibody (Routine testing w  reflex) panel)  Once,   R         02/04/22 0027            Vitals/Pain Today's Vitals   02/04/22 0745 02/04/22 0845 02/04/22 0930 02/04/22 1130  BP: 114/71 99/61 113/82 105/67  Pulse: (!) 58 (!) 53 (!) 52 (!) 55  Resp: '15 12 12 13  '$ Temp:  97.9 F (36.6 C)    TempSrc:  Oral    SpO2: 95% 93% 95% 96%  PainSc:  0-No pain      Isolation Precautions No active isolations  Medications Medications  enoxaparin (LOVENOX) injection 30 mg (30 mg Subcutaneous Given 02/04/22 1027)  atorvastatin (LIPITOR) tablet 20 mg (20 mg Oral Given 02/04/22 0940)  prazosin (MINIPRESS) capsule 2 mg (2 mg Oral Patient Refused/Not Given 02/04/22 0126)  DULoxetine (CYMBALTA) DR capsule 30 mg (30 mg Oral Given 02/04/22 1027)  OLANZapine (ZYPREXA) tablet 5 mg (5 mg Oral Given 02/04/22 0940)  asenapine (SAPHRIS) sublingual tablet 5 mg (5 mg Sublingual Given 02/04/22 1120)  pantoprazole (PROTONIX) EC tablet 40 mg (40 mg Oral Given 02/04/22 0940)  montelukast (SINGULAIR) tablet 10 mg (10 mg Oral Patient Refused/Not Given 02/04/22 0126)  azithromycin (ZITHROMAX) 500 mg in sodium chloride 0.9 % 250 mL IVPB (has no administration in time range)  cefTRIAXone (ROCEPHIN) 1 g in sodium chloride 0.9 % 100 mL IVPB (has no administration in time range)  furosemide (LASIX) injection 60 mg (has no administration in time range)  cefTRIAXone (ROCEPHIN) 1 g in sodium chloride 0.9 % 100 mL IVPB (0 g Intravenous Stopped 02/04/22 0121)  azithromycin (ZITHROMAX) 500 mg in sodium chloride 0.9 % 250 mL IVPB (0 mg Intravenous Stopped 02/04/22 0228)  furosemide (LASIX) injection 60 mg (60 mg Intravenous Given 02/04/22 0121)    Mobility walks with person assist     Focused Assessments Cardiac Assessment Handoff:  Cardiac Rhythm: Sinus bradycardia Lab Results  Component Value Date   CKTOTAL 159 07/10/2021   CKMB 3.0 04/25/2011   TROPONINI <0.03 01/07/2017   Lab Results  Component Value Date   DDIMER 0.48 05/12/2017   Does the Patient currently have chest pain? No

## 2022-02-04 NOTE — Assessment & Plan Note (Addendum)
BNP elevated, orthopnea, SOB, with BLE.(Home Torsemide '20mg'$  BID). Echo with normal EF (60-65%), G2DD.Severely dilated LA. Net negative -3.1L last 24h. Wt last year 74kg. Will continue to diurese. -Lasix 60 mg IV today, likely PO tomorrow -Cardiac monitoring -Strict I/O, daily weights -f/u BMP

## 2022-02-04 NOTE — Progress Notes (Signed)
Received page from RN regarding LUQ abdominal pain reported by patient.   MD walked into room to find patient awake and resting comfortably, mouth open slightly, nasal cannula perched on top of her nasal bridge. SpO2 without supplemental O2 96%.   She reports bad side pain for about 30 minutes. Acute onset, no inciting event. She also says her breathing has worsened since onset of abdominal pain, believes she is wheezing.  Last bowel movement "several days ago". Repotrs history of IBS-D, say she takes bentyl and imodium every day but these are not currently ordered.   No prior personal history of PE or DVT, spO2 normal, normal HR. No overt respiratory distress on exam.   Blood pressure (!) 156/89, pulse 60, temperature 97.6 F (36.4 C), temperature source Oral, resp. rate 19, weight 91.2 kg, SpO2 100 %.   Gen: awake, alert, no acute distress Resp: CTAB anteriorly, normal RR Cardiac: RRR Abd: TTP over LUQ, spleen normal size to palpation, otherwise abdomen non-tender, no rebound tenderness or guarding, no rib TTP, no overlying ecchymoses or rash  A/P:  Likely due to constipation. Does not have an acute surgical abdomen at this time. Unlikely to be PE given normal SpO2 and pulse. Unlikely to be pancreatitis based on clinical presentation and location. Spleen unremarkable on exam. VSS, no evidence of respiratory distress or wheezing. Will start by addressing constipation. Will follow.  - lidocaine patch over painful flank area - miralax daily starting now - soap suds enema once - ambulation as able  Ezequiel [place], MD

## 2022-02-04 NOTE — ED Notes (Signed)
Pt's O2 ranging from 88-90% on RA while sleeping. Pt placed on 2lpm [place], O2 increased to 96%.

## 2022-02-04 NOTE — Assessment & Plan Note (Signed)
Wheelchair bound at home.  -PT/OT

## 2022-02-04 NOTE — ED Notes (Addendum)
Pt eating breakfast. ABCs intact, NAD noted at this time.

## 2022-02-04 NOTE — Hospital Course (Addendum)
Tiffany [name] is a 62 y.o.female with a history of CAD s/p MI x2, CHF, HTN, HLD, CKD, schizophrenia/schizoaffective disorder, asthma/COPD, stroke, GERD  who was admitted to the [facility] Medicine Teaching Service at [facility] for CHF exacerbation. Her hospital course is detailed below:  Acute on chronic CHF  dyspnea Presented with worsening dyspnea, orthopnea, lower extremity edema.  Refractory to outpatient management with torsemide 20 mg twice daily.  CXR with interstitial edema.  Echo with normal EF (60 to 65%), G2 DD, severely dilated LA.  Pulmonary artery hypertension.  Received diuresis with IV Lasix with improvement.    Sent home with Torsemide '20mg'$  daily.  On admission, there was concern for possible CAP given productive cough with changes on imaging, so she received IV ceftriaxone and azithromycin which were then stopped.  Pro-Cal normal.   Right upper lobe opacity/nodule Noted to have 1.8 x 1.5 cm focal opacity in RUL, consider infectious versus inflammatory versus underlying neoplasm given smoking history.  Recommend follow-up imaging in 3 months per radiology read.   Altered mental status During her stay, rapid response was called due to patient being unresponsive overnight.  Noted to have pinpoint pupils.  Responded to 3.4 mg total of Narcan, but remained drowsy until the morning after which she returned to baseline.  Unclear etiology, no opioid administration during this stay, UDS positive for cocaine and benzos but negative for opioids, denied taking any opioids at home.  Of note, patient did attempt to leave AMA before this happened, so there was some suspicion that she may have ingested something during this time, but this was not confirmed.  After this episode, patient remained stable.    Discontinued Lyrica on discharge.  CKD Patient noted to have worsening CKD over the past few months.  AKI likely in the setting of increasing home torsemide.  Renal ultrasound completed, showed  chronic kidney disease with no hydronephrosis, UA unremarkable.  Recommend outpatient nephrology follow-up.   Other chronic conditions were medically managed with home medications and formulary alternatives as necessary  PCP Follow-up Recommendations:  PCP Follow Up:  Recommend follow-up imaging in 3 months for RUL opacity noted on CT (repeat chest CT, follow up PET-CT, or tissue sampling per radiology read) Reevaluate diuretic regimen given worsening CKD Outpatient nephrology follow up  Review psychotropic/sedating medications, recommend psych follow-up. Dc'd lyrica    Imaging:   ECHO 02/04/22: IMPRESSIONS    1. Left ventricular ejection fraction, by estimation, is 60 to 65%. The  left ventricle has normal function. The left ventricle has no regional  wall motion abnormalities. The left ventricular internal cavity size was  mildly dilated. There is mild left  ventricular hypertrophy. Left ventricular diastolic parameters are  consistent with Grade II diastolic dysfunction (pseudonormalization).  Elevated left atrial pressure.   2. Right ventricular systolic function is normal. The right ventricular  size is normal. There is mildly elevated pulmonary artery systolic  pressure. The estimated right ventricular systolic pressure is 34.9 mmHg.   3. Left atrial size was severely dilated.   4. Right atrial size was mildly dilated.   5. The mitral valve is normal in structure. Trivial mitral valve  regurgitation. No evidence of mitral stenosis.   6. The aortic valve is tricuspid. Aortic valve regurgitation is not  visualized. Aortic valve sclerosis is present, with no evidence of aortic  valve stenosis.   7. The inferior vena cava is dilated in size with >50% respiratory  variability, suggesting right atrial pressure of 8 mmHg.  CXR IMPRESSION: 1. Shallow inspiration radiograph. 2. Cardiomegaly. 3. Prominence of the interstitial lung markings, suggesting interstitial  edema. 4. No appreciable airspace consolidation.   CT CHEST WO CONTRAST IMPRESSION: 1. Focal opacity measuring 1.8 cm in the posterior segment of the right upper lobe which may be infectious or inflammatory. The possibility of underlying neoplasm can not be excluded. Consider one of the following in 3 months for both low-risk and high-risk individuals: (a) repeat chest CT, (b) follow-up PET-CT, or (c) tissue sampling. This recommendation follows the consensus statement: Guidelines for Management of Incidental Pulmonary Nodules Detected on CT Images: From the Fleischner Society 2017; Radiology 2017; 284:228-243. 2. Distended pulmonary trunk, suggesting underlying pulmonary artery hypertension. 3. Cardiomegaly. 4. Cholelithiasis.   US RENAL IMPRESSION: Increased echotexture compatible with chronic medical renal disease.   No hydronephrosis.

## 2022-02-04 NOTE — ED Notes (Signed)
Pt to imaging

## 2022-02-04 NOTE — Progress Notes (Signed)
*  PRELIMINARY RESULTS* Echocardiogram 2D Echocardiogram has been performed.  Tiffany [name] 02/04/2022, 4:50 PM

## 2022-02-04 NOTE — ED Notes (Signed)
Pt given sandwich bag, graham crackers, and sprite per request.

## 2022-02-04 NOTE — Progress Notes (Signed)
     Daily Progress Note Intern Pager: [phone]  Patient name: Tiffany [name] Medical record number: [id number] Date of birth: 04/06/1960 Age: 62 y.o. Gender: female  Primary Care Provider: Leslie Dales, DO Consultants: None Code Status: Full  Pt Overview and Major Events to Date:  1/19-admitted  Assessment and Plan: Tiffany [name] is a 62 y.o. female presenting with shortness of breath and leg swelling, with concern for CAP and CHF exacerbation. Has also had worsening renal function over the past few months. PMHx includes CAD s/p MI x2, CHF, HTN, HLD, CKD, schizophrenia/schizoaffective disorder, asthma/COPD, stroke, GERD    Dyspnea CT chest with RUL opacity that could represent infection, inflammation, or possibly neoplasm. Given her cough, SOB, CT findings, and reported hypoxemia to home, will treat for CAP but continue to consider other etiologies and treat for CHF exacerbation as discussed below.  -Continue IV CTX/AZT for presumed CAP  -Monitor for fever -f/u sputum cx -pro calcitonin  - repeat imaging in 3 months to follow RUL opacity  - am CBC   CHF exacerbation (HCC) BNP elevated, orthopnea, SOB, with BLE. S/p lasix 60 mg IV w/ UOP 1L ON. Will continue to diurese. -Lasix 60 mg IV today -f/u echo -Cardiac monitoring -Strict I/O, daily weights -am BMP  CKD (chronic kidney disease) Cr 2.9, baseline was 1.5 but Cr of 2.48 last month. May represent worsening of CKD vs AKI.  Taking torsemide at home, recently increased dosing due to leg swelling, this may be contributing.  -f/u renal U/S -f/u UA -Diuresis as above -AM BMP  Physical deconditioning Wheelchair bound at home.  -PT/OT    FEN/GI: Heart healthy/carb modified PPx: Lovenox Dispo: Pending continued medical management  Subjective:  Patient states she is still short of breath, feels like she is breathing slowly although respiratory rate is normal.  Complains of the same chest pressure she has been  complaining of for the last several days.  States she feels very tired, more so this morning than in the last few days.  Objective: Temp:  [97.8 F (36.6 C)-98.2 F (36.8 C)] 97.9 F (36.6 C) (01/20 0845) Pulse Rate:  [53-72] 53 (01/20 0845) Resp:  [11-17] 12 (01/20 0845) BP: (99-210)/(49-108) 99/61 (01/20 0845) SpO2:  [89 %-98 %] 93 % (01/20 0845) FiO2 (%):  [100 %] 100 % (01/20 0900) Physical Exam: General: Sick appearing 62 year old female, lying in bed, NAD Cardiovascular: RRR, normal S1/S2 Respiratory: CTAB, normal effort Extremities: 2+ pitting edema of BLEs  Laboratory: Most recent CBC Lab Results  Component Value Date   WBC 4.0 02/04/2022   HGB 8.2 (L) 02/04/2022   HCT 25.9 (L) 02/04/2022   MCV 92.2 02/04/2022   PLT 256 02/04/2022   Most recent BMP    Latest Ref Rng & Units 02/04/2022    5:37 AM  BMP  Glucose 70 - 99 mg/dL 116   BUN 8 - 23 mg/dL 43   Creatinine 0.44 - 1.00 mg/dL 2.90   Sodium 135 - 145 mmol/L 139   Potassium 3.5 - 5.1 mmol/L 4.4   Chloride 98 - 111 mmol/L 108   CO2 22 - 32 mmol/L 24   Calcium 8.9 - 10.3 mg/dL 8.1      Precious Gilding, DO 02/04/2022, 11:54 AM  PGY-2, [place] Intern pager: [phone], text pages welcome Secure chat group [place]

## 2022-02-05 ENCOUNTER — Other Ambulatory Visit: Payer: Self-pay

## 2022-02-05 DIAGNOSIS — R5381 Other malaise: Secondary | ICD-10-CM | POA: Diagnosis not present

## 2022-02-05 DIAGNOSIS — I5033 Acute on chronic diastolic (congestive) heart failure: Secondary | ICD-10-CM | POA: Diagnosis not present

## 2022-02-05 LAB — URINALYSIS, ROUTINE W REFLEX MICROSCOPIC
Bacteria, UA: NONE SEEN
Bilirubin Urine: NEGATIVE
Glucose, UA: NEGATIVE mg/dL
Hgb urine dipstick: NEGATIVE
Ketones, ur: NEGATIVE mg/dL
Leukocytes,Ua: NEGATIVE
Nitrite: NEGATIVE
Protein, ur: 100 mg/dL — AB
Specific Gravity, Urine: 1.008 (ref 1.005–1.030)
pH: 7 (ref 5.0–8.0)

## 2022-02-05 LAB — BASIC METABOLIC PANEL
Anion gap: 8 (ref 5–15)
BUN: 45 mg/dL — ABNORMAL HIGH (ref 8–23)
CO2: 25 mmol/L (ref 22–32)
Calcium: 8 mg/dL — ABNORMAL LOW (ref 8.9–10.3)
Chloride: 104 mmol/L (ref 98–111)
Creatinine, Ser: 2.77 mg/dL — ABNORMAL HIGH (ref 0.44–1.00)
GFR, Estimated: 19 mL/min — ABNORMAL LOW (ref >60)
Glucose, Bld: 86 mg/dL (ref 70–99)
Potassium: 4.4 mmol/L (ref 3.5–5.1)
Sodium: 137 mmol/L (ref 135–145)

## 2022-02-05 LAB — CBC
HCT: 27.6 % — ABNORMAL LOW (ref 36.0–46.0)
Hemoglobin: 8.6 g/dL — ABNORMAL LOW (ref 12.0–15.0)
MCH: 28.3 pg (ref 26.0–34.0)
MCHC: 31.2 g/dL (ref 30.0–36.0)
MCV: 90.8 fL (ref 80.0–100.0)
Platelets: 264 10*3/uL (ref 150–400)
RBC: 3.04 MIL/uL — ABNORMAL LOW (ref 3.87–5.11)
RDW: 16 % — ABNORMAL HIGH (ref 11.5–15.5)
WBC: 4.4 10*3/uL (ref 4.0–10.5)
nRBC: 0 % (ref 0.0–0.2)

## 2022-02-05 MED ORDER — FUROSEMIDE 10 MG/ML IJ SOLN
60.0000 mg | Freq: Once | INTRAMUSCULAR | Status: AC
  Administered 2022-02-05: 60 mg via INTRAVENOUS
  Filled 2022-02-05: qty 6

## 2022-02-05 MED ORDER — AMLODIPINE BESYLATE 10 MG PO TABS
10.0000 mg | ORAL_TABLET | Freq: Every day | ORAL | Status: DC
  Administered 2022-02-05 – 2022-02-07 (×3): 10 mg via ORAL
  Filled 2022-02-05 (×3): qty 1

## 2022-02-05 MED ORDER — PREGABALIN 100 MG PO CAPS
100.0000 mg | ORAL_CAPSULE | Freq: Once | ORAL | Status: AC | PRN
  Administered 2022-02-06: 100 mg via ORAL
  Filled 2022-02-05: qty 1

## 2022-02-05 NOTE — TOC Initial Note (Signed)
Transition of Care [facility]) - Initial/Assessment Note    Patient Details  Name: Tiffany [name] MRN: [id number] Date of Birth: [birth year]  [facility] CM/SW Contact:    Bartholomew Crews, RN Phone Number: [phone] 02/05/2022, 2:53 PM  Clinical Narrative:                  Spoke with patient at the bedside to discuss post acute transition. PTA home alone. Has PCS aide, Isabella Stalling, who she stated is her contact person - contact information updated in Epic. Patient could not recall agency that [place] works. Patient is active with [facility] for [facility]. She has electric wheelchair.  Patient uses Medicaid transportation. Patient stated that she does not want to go to SNF. TOC following for transition needs.   Expected Discharge Plan: [place] Barriers to Discharge: Continued Medical Work up   Patient Goals and CMS Choice Patient states their goals for this hospitalization and ongoing recovery are:: return to her home CMS Medicare.gov Compare Post Acute Care list provided to:: Patient Choice offered to / list presented to : Patient      Expected Discharge Plan and Services   Discharge Planning Services: CM Consult Post Acute Care Choice: NA Living arrangements for the past 2 months: Apartment                 DME Arranged: N/A DME Agency: NA         HH Agency: [facility]        Prior Living Arrangements/Services Living arrangements for the past 2 months: Apartment Lives with:: Self Patient language and need for interpreter reviewed:: Yes Do you feel safe going back to the place where you live?: Yes      Need for Family Participation in Patient Care: Yes (Comment) Care giver support system in place?: Yes (comment) Current home services: DME, Homehealth aide Criminal Activity/Legal Involvement Pertinent to Current Situation/Hospitalization: No - Comment as needed  Activities of Daily Living   ADL Screening (condition at time of  admission) Patient's cognitive ability adequate to safely complete daily activities?: Yes Is the patient deaf or have difficulty hearing?: No Does the patient have difficulty seeing, even when wearing glasses/contacts?: No Does the patient have difficulty concentrating, remembering, or making decisions?: No Patient able to express need for assistance with ADLs?: Yes  Permission Sought/Granted Permission sought to share information with : Family Supports    Share Information with NAME: Isabella Stalling     Permission granted to share info w Relationship: aide  Permission granted to share info w Contact Information: [phone]  Emotional Assessment Appearance:: Appears stated age Attitude/Demeanor/Rapport: Engaged Affect (typically observed): Accepting Orientation: : Oriented to Self, Oriented to Place, Oriented to  Time, Oriented to Situation Alcohol / Substance Use: Not Applicable Psych Involvement: No (comment)  Admission diagnosis:  Peripheral edema [R60.9] Pneumonia [J18.9] AKI (acute kidney injury) ([place]) [N17.9] Community acquired pneumonia of right upper lobe of lung [J18.9] Patient Active Problem List   Diagnosis Date Noted   Acute on chronic diastolic heart failure ([place]) 02/04/2022   Chronic kidney disease (CKD), stage IV (severe) ([place]) 02/03/2022   Dyspnea 02/03/2022   Encounter to establish care 01/05/2022   Malingering 10/19/2021   Recurrent syncope 07/09/2021   Chronic leg pain 07/09/2021   Dehydration 07/09/2021   Delusional disorder, persecutory type ([place]) 06/26/2021   Rhabdomyolysis 06/16/2021   Normocytic anemia 06/16/2021   Anxiety and depression 05/25/2021   Physical deconditioning  05/25/2021   Cauda equina compression (HCC) 04/24/2021   Hematochezia    Hyperlipidemia 03/31/2021   GI bleed 03/30/2021   Acute GI bleeding 03/29/2021   Anemia in other chronic diseases classified elsewhere 01/16/2021   Coronary artery disease involving native coronary artery  of native heart without angina pectoris 01/16/2021   Anxiety disorder, unspecified 01/16/2021   Atypical chest pain 01/13/2021   GERD (gastroesophageal reflux disease)    Neuropathy 11/18/2020   Pain in both feet 11/18/2020   Bilateral leg edema 11/18/2020   Encounter for medication management 11/17/2020   Protein-calorie malnutrition, severe 05/15/2020   Malnutrition of moderate degree 02/54/2706   Periumbilical abdominal pain    Dysphagia    Nausea vomiting and diarrhea    Abdominal pain    E. coli UTI 23/76/2831   Acute metabolic encephalopathy 51/76/1607   Schizophrenia ([place]) 02/20/2020   Schizophrenia, schizo-affective ([place]) 37/10/6267   Metabolic acidosis, normal anion gap (NAG) 02/20/2020   Altered mental status    Hypocalcemia    AKI (acute kidney injury) ([place]) 02/18/2020   Pelvic mass in female 01/02/2020   Genital herpes 11/25/2019   Stage 2 skin ulcer of sacral region ([place]) 10/28/2019   Acute renal failure superimposed on stage 3b chronic kidney disease ([place]) 10/27/2019   Family discord 02/04/2019   Aggressive behavior    PTSD (post-traumatic stress disorder) 05/27/2018   Schizoaffective disorder, bipolar type ([place]) 04/05/2018   Closed displaced fracture of fifth metacarpal bone 03/21/2018   Difficulty with speech 01/24/2018   Encephalopathy 11/21/2017   Drug abuse ([place]) 11/21/2017   Frequent falls 10/11/2017   Binge eating disorder    Dependence on continuous supplemental oxygen 05/14/2017   Gout 04/11/2017   Chronic diastolic congestive heart failure ([place]) 04/02/2017   Hypomagnesemia    Stage 3b chronic kidney disease ([place]) 12/15/2016   Carotid artery stenosis    Hypokalemia    Osteoarthritis 10/26/2016   Anoxic brain injury ([place]) 09/08/2016   Overactive bladder 06/07/2016   QT prolongation    OSA and COPD overlap syndrome ([place])    Arthritis    Essential hypertension 03/22/2016   Cocaine use disorder, severe, in sustained remission ([place]) 12/17/2015    History of drug abuse in remission ([place]) 11/28/2015   Hyponatremia 11/25/2015   Chronic respiratory failure with hypoxia ([place]) 06/22/2015   Tobacco use disorder 07/22/2014   COPD (chronic obstructive pulmonary disease) ([place]) 07/08/2014   Seizure ([place]) 01/04/2013   Chronic low back pain with sciatica 06/18/2012   Dyslipidemia 04/24/2011   Anemia 04/24/2011   Diabetic neuropathy ([place]) 04/24/2011   Morbid obesity ([employer]) 10/18/2010   Type 2 diabetes mellitus ([place]) 10/18/2010   PCP:  Leslie Dales, DO Pharmacy:   [facility] [place], [place] - [address] [place] [address] Phone: [phone] Fax: [phone]     Social Determinants of Health (SDOH) Social History: SDOH Screenings   Food Insecurity: No Food Insecurity (09/14/2021)  Recent Concern: Food Insecurity - Food Insecurity Present (06/23/2021)  Housing: High Risk (09/14/2021)  Transportation Needs: Unmet Transportation Needs (09/14/2021)  Alcohol Screen: Low Risk  (07/24/2021)  Depression (PHQ2-9): Medium Risk (07/24/2021)  Financial Resource Strain: Medium Risk (07/24/2021)  Physical Activity: Inactive (07/24/2021)  Social Connections: Moderately Isolated (07/24/2021)  Stress: Stress Concern Present (07/24/2021)  Tobacco Use: Medium Risk (02/03/2022)   SDOH Interventions:     Readmission Risk Interventions    05/24/2021    6:19 PM 07/26/2020  2:21 PM 02/23/2020    1:16 PM  Readmission Risk Prevention Plan  Transportation Screening Complete Complete Complete  Medication Review ([place]) Referral to Pharmacy Complete Complete  PCP or Specialist appointment within 3-5 days of discharge Complete Complete Complete  HRI or Home Care Consult Complete Complete Complete  SW Recovery Care/Counseling Consult Complete Complete Complete  Palliative Care Screening  Not Applicable Complete  Skilled Nursing Facility Complete Complete Complete

## 2022-02-05 NOTE — Assessment & Plan Note (Signed)
Endorses abdominal pain overnight. No acute abdomen on exam. Likely 2/2 constipation. Was given soap suds enema x1 with good response - Miralax daily - continue to monitor

## 2022-02-05 NOTE — Progress Notes (Signed)
Received page that patient complained of problems with her speech and left sided sensation changes. Went to evaluate patient and could not tell a notable difference in speech. Performed full neuro exam without facial droop and normal motor function. She felt that that she had increased sensation on the right side of her face and upper extremity. She stated the light touches of her lower extremity were painful. She was able to hold arms up and had equal strength of both. States she is unable to lift legs off  the bed at baseline. No objective changes to exam but will continue to monitor

## 2022-02-05 NOTE — Care Management Obs Status (Signed)
[place] NOTIFICATION   Patient Details  Name: Tiffany [name] MRN: [id number] Date of Birth: [birth year]   Medicare Observation Status Notification Given:  Yes    Bartholomew Crews, RN 02/05/2022, 2:39 PM

## 2022-02-05 NOTE — Progress Notes (Signed)
Received message from RN that patient wants to leave AMA.  Went up there to discuss importance of staying so that we can continue to treat her heart failure exacerbation, but patient adamant that she can do the same thing at home on her own and she will get all of her medications. Feels we are not doing anything for her here. Discussed that she would be leaving [place], but patient adamant that she wants to leave.

## 2022-02-05 NOTE — Progress Notes (Signed)
Patient requesting to leave against medical advice. She refused to sign the AMA paper work. I brought in Leanor Rubenstein, RN to witness that patient's request to leave. Paperwork signed by Raymond Gurney and Judson Roch, RN.  IV removed. Tele monitor removed and discharged. Patient requesting paper scrubs because her clothes are wet. I did advise her that we are unable to call the ambulance for her due to [facility].  Administrative Coordinator [place] notified. Staff advised to take patient to the ED waiting room while she waits on her ride.

## 2022-02-05 NOTE — Evaluation (Signed)
Occupational Therapy Evaluation Patient Details Name: Tiffany [name] MRN: [id number] DOB: Aug 21, 1960 Today's Date: 02/05/2022   History of Present Illness Tiffany [name] is a 62 y.o. female presenting with shortness of breath and leg swelling, with concern for CAP and CHF exacerbation. Has also had worsening renal function over the past few months. PMHx includes CAD s/p MI x2, CHF, HTN, HLD, CKD, schizophrenia/schizoaffective disorder, asthma/COPD, stroke, GERD, chronic pain syndrom, anoxic brain injury, PTSD, CVA and polysubstance use.   Clinical Impression   Patient is currently requiring assistance with ADLs including up to total assist with Lower body ADLs at bed level, minimal assist with seated Upper body ADLs,  as well as moderate assist with bed mobility and moderate assist with functional transfers to drop arm BSC or toilet at [facility] level.    Current level of function may be below patient's typical baseline, but pt not totally forthcoming with PLOF information.  During this evaluation, patient was limited by LE pain of 7/10, generalized weakness, impaired activity tolerance with audible wheezing after activities, and baseline non-ambulation, all of which has the potential to impact patient's safety and independence during functional mobility, as well as performance for ADLs.  Patient lives in her 1st floor apartment with 3 steps to enter and no rail. Pt has a HH Aide,  who is able to provide supervision and assistance 7 days a week for 4 hours a day.    Patient demonstrates fair rehab potential, and should benefit from continued skilled occupational therapy services while in acute care to maximize safety, independence and quality of life at home.  Continued occupational therapy services in a SNF setting prior to return home is recommended, however pt not in agreement to this and states that she will be fine at home with her Aide.  ?      Recommendations for follow up therapy are one  component of a multi-disciplinary discharge planning process, led by the attending physician.  Recommendations may be updated based on patient status, additional functional criteria and insurance authorization.   Follow Up Recommendations  Skilled nursing-short term rehab (<3 hours/day) (Discussed with pt who refuses SNF and stated that she will be fine at home. If pt refuses SNF, please order home health PT and OT)     Assistance Recommended at Discharge Frequent or constant Supervision/Assistance  Patient can return home with the following Two people to help with walking and/or transfers;A lot of help with walking and/or transfers;A lot of help with bathing/dressing/bathroom;Assist for transportation;Help with stairs or ramp for entrance;Assistance with cooking/housework    Functional Status Assessment  Patient has had a recent decline in their functional status and demonstrates the ability to make significant improvements in function in a reasonable and predictable amount of time.  Equipment Recommendations   (Will contiune to determine.)    Recommendations for Other Services PT consult     Precautions / Restrictions Precautions Precautions: Fall Precaution Comments: Non-ambulatory at baseline Restrictions Weight Bearing Restrictions: No      Mobility Bed Mobility Overal bed mobility: Needs Assistance Bed Mobility: Rolling, Supine to Sit, Sit to Supine Rolling: Mod assist, Max assist   Supine to sit: Mod assist, HOB elevated Sit to supine: Max assist        Transfers                          Balance Overall balance assessment: Needs assistance, History of Falls   Sitting balance-Leahy Scale:  Poor Sitting balance - Comments: Pt unable to flex knees to plant fet underneath her to assist with balance.  Has posterior lean   Standing balance support: During functional activity, Bilateral upper extremity supported Standing balance-Leahy Scale: Poor Standing  balance comment: Required forearm support on vanity. Poor tolerance to standing as well.                           ADL either performed or assessed with clinical judgement   ADL Overall ADL's : Needs assistance/impaired Eating/Feeding: Set up   Grooming: Sitting;Set up;Wash/dry face;Oral care;Wash/dry hands   Upper Body Bathing: Set up;Min guard;Sitting   Lower Body Bathing: Maximal assistance;Bed level;Sit to/from stand Lower Body Bathing Details (indicate cue type and reason): See toileting. Otherwise, pt rolled in bed with Mod-MAx As for LE bathing with need of MAx As for task. Upper Body Dressing : Minimal assistance;Sitting   Lower Body Dressing: Total assistance;Bed level Lower Body Dressing Details (indicate cue type and reason): Pt reports that her Aide assists at baseline with socks/shoes. Total As to don socks at bed level. Toilet Transfer: Moderate assistance;Squat-pivot;Cueing for safety;Minimal assistance Toilet Transfer Details (indicate cue type and reason): Pt performed latearl scoot EOB->armless chair-->EOB with Min As to chair and Mod As to bed. Toileting- Clothing Manipulation and Hygiene: Maximal assistance;Sitting/lateral lean Toileting - Clothing Manipulation Details (indicate cue type and reason): Pt found to be soiled with large BM at bed level. Pt assisted to stand at sink, pt resting forearms on vanity with OT assisting with standing and CNA provided MAx As peri hygiene. Once back in chair, pt able to lean far forward and assist her self with some cleaning of posterior peri.     Functional mobility during ADLs: Cueing for sequencing;Moderate assistance;Maximal assistance;Cueing for safety       Vision Baseline Vision/History: 1 Wears glasses Ability to See in Adequate Light: 1 Impaired Patient Visual Report: No change from baseline       Perception     Praxis      Pertinent Vitals/Pain Pain Assessment Pain Assessment: 0-10 Pain Score: 7   Pain Location: BLEs- "neuropathy" Pain Intervention(s): Limited activity within patient's tolerance, Monitored during session, Repositioned     Hand Dominance Right   Extremity/Trunk Assessment Upper Extremity Assessment Upper Extremity Assessment: Generalized weakness   Lower Extremity Assessment Lower Extremity Assessment: Defer to PT evaluation   Cervical / Trunk Assessment Cervical / Trunk Assessment: Kyphotic   Communication Communication Communication: No difficulties   Cognition Arousal/Alertness: Awake/alert Behavior During Therapy: WFL for tasks assessed/performed Overall Cognitive Status: No family/caregiver present to determine baseline cognitive functioning                                 General Comments: No gross deficits. Oriented x all but situation to which pt was vague.     General Comments       Exercises     Shoulder Instructions      Home Living Family/patient expects to be discharged to:: Private residence Living Arrangements: Alone Available Help at Discharge: Personal care attendant;Available PRN/intermittently (Pt reports HH Aide 7 days a week for 4 hours a day. HAs a brother PRN who provides transportation) Type of Home: Apartment Home Access: Stairs to enter [employer] of Steps: 3 Entrance Stairs-Rails: None Home Layout: One level     Bathroom Shower/Tub: Sponge bathes at baseline  Bathroom Toilet: Standard     Home Equipment: Rollator (4 wheels);BSC/3in1;Wheelchair - power;Wheelchair - manual   Additional Comments: Pt reports that she sleeps on the couch      Prior Functioning/Environment Prior Level of Function : Needs assist;History of Falls (last six months)       Physical Assist : ADLs (physical)   ADLs (physical): IADLs;Dressing;Bathing Mobility Comments: Pt reports that she mobilizes herself from couch to wheelchair and uses manual WC ad lib to keep up strength in UEs and LEs. Positive for  falls when transferring. ADLs Comments: Pt reports that her Aide helps her with "Everything". Pt reports that talking fatigues her and did not give full picture, but pt reports Aide assists with bathing and dressing and pt usually can attend to her toileting needs. Pt reports Aide provides all IADLs.        OT Problem List: Pain;Cardiopulmonary status limiting activity;Decreased strength;Increased edema;Decreased activity tolerance;Decreased safety awareness;Impaired balance (sitting and/or standing);Decreased knowledge of use of DME or AE;Obesity      OT Treatment/Interventions: Self-care/ADL training;Therapeutic exercise;Therapeutic activities;Energy conservation;Patient/family education;DME and/or AE instruction;Balance training    OT Goals(Current goals can be found in the care plan section) Acute Rehab OT Goals Patient Stated Goal: To go home OT Goal Formulation: With patient Time For Goal Achievement: 02/19/22 Potential to Achieve Goals: Fair ADL Goals Pt Will Perform Grooming: with modified independence (At [facility] level) Pt Will Transfer to Toilet: with modified independence;squat pivot transfer (Mobilizing WC to toilet) Pt Will Perform Toileting - Clothing Manipulation and hygiene: with modified independence;sitting/lateral leans;with adaptive equipment (Educate pt on adaptive equipment for toieting if needed) Pt/caregiver will Perform Home Exercise Program: Increased strength;Both right and left upper extremity;With Supervision Additional ADL Goal #1: Patient will identify at least 3 fall prevention strategies to employ at home in order to maximize function and safety during ADLs and decrease caregiver burden while preventing possible injury and rehospitalization.  OT Frequency: Min 2X/week    Co-evaluation              AM-PAC OT "6 Clicks" Daily Activity     Outcome Measure Help from another person eating meals?: A Little Help from another person taking care of personal  grooming?: A Little Help from another person toileting, which includes using toliet, bedpan, or urinal?: A Lot Help from another person bathing (including washing, rinsing, drying)?: A Lot Help from another person to put on and taking off regular upper body clothing?: A Little Help from another person to put on and taking off regular lower body clothing?: Total 6 Click Score: 14   End of Session Equipment Utilized During Treatment: Gait belt Nurse Communication: Other (comment) (CNA in room to witness scoot chair to EOB and sit to supine.)  Activity Tolerance: Patient limited by pain;Patient limited by fatigue Patient left: in bed;with nursing/sitter in room  OT Visit Diagnosis: Other abnormalities of gait and mobility (R26.89);Repeated falls (R29.6);Muscle weakness (generalized) (M62.81);History of falling (Z91.81);Pain Pain - Right/Left:  (Bil) Pain - part of body: Leg;Ankle and joints of foot;Knee                Time: 7858-8502 OT Time Calculation (min): 41 min Charges:  OT General Charges $OT Visit: 1 Visit OT Evaluation $OT Eval Low Complexity: 1 Low OT Treatments $Self Care/Home Management : 8-22 mins $Therapeutic Activity: 8-22 mins  Anderson Malta, OT Acute Rehab Services Office: [phone] 02/05/2022  Julien Girt 02/05/2022, 11:01 AM

## 2022-02-05 NOTE — Progress Notes (Signed)
Daily Progress Note Intern Pager: [phone]  Patient name: Tiffany [name] Medical record number: [id number] Date of birth: 04-28-1960 Age: 62 y.o. Gender: female  Primary Care Provider: Leslie Dales, DO Consultants: None Code Status: Full  Pt Overview and Major Events to Date:  1/19 Admitted   Assessment and Plan: Tiffany [name] is a 62 y.o. female presenting with shortness of breath and leg swelling, with concern for CAP and CHF exacerbation. Has also had worsening renal function over the past few months. PMHx includes CAD s/p MI x2, CHF, HTN, HLD, CKD, schizophrenia/schizoaffective disorder, asthma/COPD, stroke, GERD    * Acute on chronic diastolic heart failure (HCC) BNP elevated, orthopnea, SOB, with BLE.(Home Torsemide '20mg'$  BID). Echo with normal EF (60-65%), G2DD.Severely dilated LA. S/p lasix 60 mg IV x2 w/ UOP 2.75 L yesterday. Wt last year 58kg. Will continue to diurese. -Lasix 60 mg IV today -f/u echo -Cardiac monitoring -Strict I/O, daily weights -am BMP  Dyspnea Currently satting well on room air. Does have COPD and OSA overlap syndrome on CPAP at night per chart review. CT chest with RUL opacity that could represent infection, inflammation, or possibly neoplasm.  Procal .10. Less likely to be CAP and will d/c abx -Monitor for fever -f/u sputum cx - repeat imaging in 3 months to follow RUL opacity  - cpap at night - am CBC   Chronic kidney disease (CKD), stage IV (severe) (HCC) Cr 2.77 down from 2.9, yesterday. baseline was 1.5 but Cr of 2.48 last month. Likely AKI and worsening of CKD. Taking torsemide at home, recently increased dosing due to leg swelling, this may be contributing. Renal US showing chronic kidney disease. No hydronephrosis. UA unremarkable.  -Diuresis as above -AM BMP  Physical deconditioning Wheelchair bound at home.  -PT/OT  Abdominal pain Endorses abdominal pain overnight. No acute abdomen on exam. Likely 2/2 constipation. Was  given soap suds enema x1 with good response - Miralax daily - continue to monitor    Chronic and stable:  HLD: Home Lipitor HTN: home prazosin (held amlodipine due to swelling) GERD: Protonix (on Prilosec at home) Schizophrenia: home Zyprexa, Saphris  COPD: home singuair OSA: on CPAP at nightper records      FEN/GI: heart healthy/carb modified PPx: Lovenox  Dispo:Pending  continued medical management.   Subjective:   Patient sleeping  comfortably when I came into the room. Awakens to physical stimuli. Endorses feeling better and denies any pain this morning.    Objective: Temp:  [97.6 F (36.4 C)-98.6 F (37 C)] 98.6 F (37 C) (01/21 0531) Pulse Rate:  [55-72] 72 (01/20 2343) Resp:  [13-19] 14 (01/21 0531) BP: (105-156)/(67-89) 134/82 (01/21 0531) SpO2:  [96 %-100 %] 99 % (01/21 0931) Weight:  [90.8 kg-91.2 kg] 90.8 kg (01/21 0531) Physical Exam: General: sleeping, NAD Cardiovascular: RRR.  Respiratory: CTAB normal WOB Abdomen: non distended  Extremities: 2+ pitting edema bilaterally   Laboratory: Most recent CBC Lab Results  Component Value Date   WBC 4.4 02/05/2022   HGB 8.6 (L) 02/05/2022   HCT 27.6 (L) 02/05/2022   MCV 90.8 02/05/2022   PLT 264 02/05/2022   Most recent BMP    Latest Ref Rng & Units 02/05/2022    5:51 AM  BMP  Glucose 70 - 99 mg/dL 86   BUN 8 - 23 mg/dL 45   Creatinine 0.44 - 1.00 mg/dL 2.77   Sodium 135 - 145 mmol/L 137   Potassium 3.5 - 5.1 mmol/L 4.4  Chloride 98 - 111 mmol/L 104   CO2 22 - 32 mmol/L 25   Calcium 8.9 - 10.3 mg/dL 8.0     Imaging/Diagnostic Tests:  Echo: LVEF 60-65%. G2DD. Severely dilated LA  Shary Key, DO 02/05/2022, 9:48 AM  PGY-3, [place] Intern pager: [phone], text pages welcome Secure chat group [place]

## 2022-02-05 NOTE — Progress Notes (Signed)
Patient's urine output is greater than what is charted. She has required x4 total bed changes due to purewick leaking.

## 2022-02-05 NOTE — Progress Notes (Signed)
PT Cancellation Note  Patient Details Name: Tiffany [name] MRN: [id number] DOB: September 25, 1960   Cancelled Treatment:    Reason Eval/Treat Not Completed: Patient declined, no reason specified. Pt stating "I have already been up 3x. I am not doing anything else.", declining to participate, even minimally, in PT Evaluation at this time. Pt agreeable to PT checking back later if time permits though.   Moishe Spice, PT, DPT Acute Rehabilitation Services  Office: [place] 02/05/2022, 11:09 AM

## 2022-02-06 ENCOUNTER — Ambulatory Visit: Payer: Medicare HMO | Admitting: Internal Medicine

## 2022-02-06 DIAGNOSIS — J189 Pneumonia, unspecified organism: Secondary | ICD-10-CM | POA: Diagnosis present

## 2022-02-06 DIAGNOSIS — R609 Edema, unspecified: Secondary | ICD-10-CM | POA: Diagnosis present

## 2022-02-06 DIAGNOSIS — I2510 Atherosclerotic heart disease of native coronary artery without angina pectoris: Secondary | ICD-10-CM | POA: Diagnosis present

## 2022-02-06 DIAGNOSIS — I5033 Acute on chronic diastolic (congestive) heart failure: Secondary | ICD-10-CM | POA: Diagnosis not present

## 2022-02-06 DIAGNOSIS — G4733 Obstructive sleep apnea (adult) (pediatric): Secondary | ICD-10-CM | POA: Diagnosis present

## 2022-02-06 DIAGNOSIS — Z1152 Encounter for screening for COVID-19: Secondary | ICD-10-CM | POA: Diagnosis not present

## 2022-02-06 DIAGNOSIS — D631 Anemia in chronic kidney disease: Secondary | ICD-10-CM | POA: Diagnosis present

## 2022-02-06 DIAGNOSIS — I2721 Secondary pulmonary arterial hypertension: Secondary | ICD-10-CM | POA: Diagnosis present

## 2022-02-06 DIAGNOSIS — K219 Gastro-esophageal reflux disease without esophagitis: Secondary | ICD-10-CM | POA: Diagnosis present

## 2022-02-06 DIAGNOSIS — F32A Depression, unspecified: Secondary | ICD-10-CM | POA: Diagnosis present

## 2022-02-06 DIAGNOSIS — N184 Chronic kidney disease, stage 4 (severe): Secondary | ICD-10-CM | POA: Diagnosis present

## 2022-02-06 DIAGNOSIS — F419 Anxiety disorder, unspecified: Secondary | ICD-10-CM | POA: Diagnosis present

## 2022-02-06 DIAGNOSIS — N179 Acute kidney failure, unspecified: Secondary | ICD-10-CM | POA: Diagnosis present

## 2022-02-06 DIAGNOSIS — I252 Old myocardial infarction: Secondary | ICD-10-CM | POA: Diagnosis not present

## 2022-02-06 DIAGNOSIS — G8929 Other chronic pain: Secondary | ICD-10-CM | POA: Diagnosis present

## 2022-02-06 DIAGNOSIS — R531 Weakness: Secondary | ICD-10-CM | POA: Diagnosis not present

## 2022-02-06 DIAGNOSIS — M351 Other overlap syndromes: Secondary | ICD-10-CM | POA: Diagnosis present

## 2022-02-06 DIAGNOSIS — Z993 Dependence on wheelchair: Secondary | ICD-10-CM | POA: Diagnosis not present

## 2022-02-06 DIAGNOSIS — Z87891 Personal history of nicotine dependence: Secondary | ICD-10-CM | POA: Diagnosis not present

## 2022-02-06 DIAGNOSIS — Z7401 Bed confinement status: Secondary | ICD-10-CM | POA: Diagnosis not present

## 2022-02-06 DIAGNOSIS — Z79899 Other long term (current) drug therapy: Secondary | ICD-10-CM | POA: Diagnosis not present

## 2022-02-06 DIAGNOSIS — I130 Hypertensive heart and chronic kidney disease with heart failure and stage 1 through stage 4 chronic kidney disease, or unspecified chronic kidney disease: Secondary | ICD-10-CM | POA: Diagnosis present

## 2022-02-06 DIAGNOSIS — E785 Hyperlipidemia, unspecified: Secondary | ICD-10-CM | POA: Diagnosis present

## 2022-02-06 DIAGNOSIS — K5900 Constipation, unspecified: Secondary | ICD-10-CM | POA: Diagnosis present

## 2022-02-06 DIAGNOSIS — Z8673 Personal history of transient ischemic attack (TIA), and cerebral infarction without residual deficits: Secondary | ICD-10-CM | POA: Diagnosis not present

## 2022-02-06 DIAGNOSIS — R400 Somnolence: Secondary | ICD-10-CM

## 2022-02-06 DIAGNOSIS — F259 Schizoaffective disorder, unspecified: Secondary | ICD-10-CM | POA: Diagnosis present

## 2022-02-06 DIAGNOSIS — J449 Chronic obstructive pulmonary disease, unspecified: Secondary | ICD-10-CM | POA: Diagnosis present

## 2022-02-06 LAB — CBC
HCT: 30 % — ABNORMAL LOW (ref 36.0–46.0)
HCT: 30.5 % — ABNORMAL LOW (ref 36.0–46.0)
Hemoglobin: 9.4 g/dL — ABNORMAL LOW (ref 12.0–15.0)
Hemoglobin: 9.8 g/dL — ABNORMAL LOW (ref 12.0–15.0)
MCH: 28.3 pg (ref 26.0–34.0)
MCH: 28.7 pg (ref 26.0–34.0)
MCHC: 31.3 g/dL (ref 30.0–36.0)
MCHC: 32.1 g/dL (ref 30.0–36.0)
MCV: 90.4 fL (ref 80.0–100.0)
MCV: 89.4 fL (ref 80.0–100.0)
Platelets: 273 10*3/uL (ref 150–400)
Platelets: 286 10*3/uL (ref 150–400)
RBC: 3.32 MIL/uL — ABNORMAL LOW (ref 3.87–5.11)
RBC: 3.41 MIL/uL — ABNORMAL LOW (ref 3.87–5.11)
RDW: 15.8 % — ABNORMAL HIGH (ref 11.5–15.5)
RDW: 15.8 % — ABNORMAL HIGH (ref 11.5–15.5)
WBC: 5.1 10*3/uL (ref 4.0–10.5)
WBC: 5.4 10*3/uL (ref 4.0–10.5)
nRBC: 0 % (ref 0.0–0.2)
nRBC: 0 % (ref 0.0–0.2)

## 2022-02-06 LAB — BASIC METABOLIC PANEL
Anion gap: 11 (ref 5–15)
Anion gap: 13 (ref 5–15)
Anion gap: 9 (ref 5–15)
BUN: 40 mg/dL — ABNORMAL HIGH (ref 8–23)
BUN: 39 mg/dL — ABNORMAL HIGH (ref 8–23)
BUN: 36 mg/dL — ABNORMAL HIGH (ref 8–23)
CO2: 26 mmol/L (ref 22–32)
CO2: 23 mmol/L (ref 22–32)
CO2: 26 mmol/L (ref 22–32)
Calcium: 8.7 mg/dL — ABNORMAL LOW (ref 8.9–10.3)
Calcium: 8.7 mg/dL — ABNORMAL LOW (ref 8.9–10.3)
Calcium: 8.7 mg/dL — ABNORMAL LOW (ref 8.9–10.3)
Chloride: 101 mmol/L (ref 98–111)
Chloride: 105 mmol/L (ref 98–111)
Chloride: 106 mmol/L (ref 98–111)
Creatinine, Ser: 2.89 mg/dL — ABNORMAL HIGH (ref 0.44–1.00)
Creatinine, Ser: 2.62 mg/dL — ABNORMAL HIGH (ref 0.44–1.00)
Creatinine, Ser: 2.69 mg/dL — ABNORMAL HIGH (ref 0.44–1.00)
GFR, Estimated: 18 mL/min — ABNORMAL LOW (ref >60)
GFR, Estimated: 20 mL/min — ABNORMAL LOW (ref >60)
GFR, Estimated: 19 mL/min — ABNORMAL LOW (ref >60)
Glucose, Bld: 91 mg/dL (ref 70–99)
Glucose, Bld: 84 mg/dL (ref 70–99)
Glucose, Bld: 104 mg/dL — ABNORMAL HIGH (ref 70–99)
Potassium: 3.5 mmol/L (ref 3.5–5.1)
Potassium: 3.5 mmol/L (ref 3.5–5.1)
Potassium: 3.4 mmol/L — ABNORMAL LOW (ref 3.5–5.1)
Sodium: 138 mmol/L (ref 135–145)
Sodium: 141 mmol/L (ref 135–145)
Sodium: 141 mmol/L (ref 135–145)

## 2022-02-06 LAB — POCT I-STAT 7, (LYTES, BLD GAS, ICA,H+H)
Acid-Base Excess: 2 mmol/L (ref 0.0–2.0)
Bicarbonate: 28.1 mmol/L — ABNORMAL HIGH (ref 20.0–28.0)
Calcium, Ion: 1.25 mmol/L (ref 1.15–1.40)
HCT: 26 % — ABNORMAL LOW (ref 36.0–46.0)
Hemoglobin: 8.8 g/dL — ABNORMAL LOW (ref 12.0–15.0)
O2 Saturation: 96 %
Patient temperature: 98.7
Potassium: 3.4 mmol/L — ABNORMAL LOW (ref 3.5–5.1)
Sodium: 141 mmol/L (ref 135–145)
TCO2: 30 mmol/L (ref 22–32)
pCO2 arterial: 49 mmHg — ABNORMAL HIGH (ref 32–48)
pH, Arterial: 7.368 (ref 7.35–7.45)
pO2, Arterial: 85 mmHg (ref 83–108)

## 2022-02-06 LAB — RAPID URINE DRUG SCREEN, HOSP PERFORMED
Amphetamines: NOT DETECTED
Barbiturates: NOT DETECTED
Benzodiazepines: POSITIVE — AB
Cocaine: POSITIVE — AB
Opiates: NOT DETECTED
Tetrahydrocannabinol: NOT DETECTED

## 2022-02-06 LAB — MAGNESIUM: Magnesium: 2.1 mg/dL (ref 1.7–2.4)

## 2022-02-06 LAB — GLUCOSE, CAPILLARY: Glucose-Capillary: 109 mg/dL — ABNORMAL HIGH (ref 70–99)

## 2022-02-06 MED ORDER — NALOXONE HCL 2 MG/2ML IJ SOSY
1.0000 mg | PREFILLED_SYRINGE | Freq: Once | INTRAMUSCULAR | Status: AC
  Administered 2022-02-06: 1 mg via INTRAVENOUS

## 2022-02-06 MED ORDER — FEBUXOSTAT 40 MG PO TABS
40.0000 mg | ORAL_TABLET | Freq: Every day | ORAL | Status: DC
  Administered 2022-02-06 – 2022-02-07 (×2): 40 mg via ORAL
  Filled 2022-02-06 (×2): qty 1

## 2022-02-06 MED ORDER — POTASSIUM CHLORIDE 20 MEQ PO PACK
40.0000 meq | PACK | Freq: Once | ORAL | Status: AC
  Administered 2022-02-06: 40 meq via ORAL
  Filled 2022-02-06: qty 2

## 2022-02-06 MED ORDER — POTASSIUM CHLORIDE CRYS ER 20 MEQ PO TBCR
60.0000 meq | EXTENDED_RELEASE_TABLET | Freq: Once | ORAL | Status: AC
  Administered 2022-02-06: 60 meq via ORAL
  Filled 2022-02-06: qty 3

## 2022-02-06 MED ORDER — FUROSEMIDE 10 MG/ML IJ SOLN
60.0000 mg | Freq: Once | INTRAMUSCULAR | Status: AC
  Administered 2022-02-06: 60 mg via INTRAVENOUS
  Filled 2022-02-06: qty 6

## 2022-02-06 MED ORDER — NALOXONE HCL 0.4 MG/ML IJ SOLN
INTRAMUSCULAR | Status: AC
  Filled 2022-02-06: qty 1

## 2022-02-06 MED ORDER — FLUTICASONE FUROATE-VILANTEROL 100-25 MCG/ACT IN AEPB
1.0000 | INHALATION_SPRAY | Freq: Every day | RESPIRATORY_TRACT | Status: DC
  Administered 2022-02-07: 1 via RESPIRATORY_TRACT
  Filled 2022-02-06: qty 28

## 2022-02-06 MED ORDER — NALOXONE HCL 2 MG/2ML IJ SOSY: 1.0000 mg | PREFILLED_SYRINGE | Freq: Once | INTRAMUSCULAR | Status: DC

## 2022-02-06 MED ORDER — LISDEXAMFETAMINE DIMESYLATE 10 MG PO CAPS: 10.0000 mg | ORAL_CAPSULE | Freq: Every morning | ORAL | Status: DC

## 2022-02-06 MED ORDER — NALOXONE HCL 2 MG/2ML IJ SOSY
2.0000 mg | PREFILLED_SYRINGE | Freq: Once | INTRAMUSCULAR | Status: AC
  Administered 2022-02-06: 2 mg via INTRAVENOUS
  Filled 2022-02-06: qty 2

## 2022-02-06 MED ORDER — LOPERAMIDE HCL 2 MG PO CAPS
4.0000 mg | ORAL_CAPSULE | Freq: Three times a day (TID) | ORAL | Status: DC | PRN
  Administered 2022-02-06 – 2022-02-07 (×3): 4 mg via ORAL
  Filled 2022-02-06 (×3): qty 2

## 2022-02-06 MED ORDER — POTASSIUM CHLORIDE CRYS ER 20 MEQ PO TBCR: 40.0000 meq | EXTENDED_RELEASE_TABLET | Freq: Two times a day (BID) | ORAL | Status: DC

## 2022-02-06 MED ORDER — NALOXONE HCL 4 MG/10ML IJ SOLN
1.0000 mg/h | INTRAVENOUS | Status: AC
  Filled 2022-02-06: qty 10

## 2022-02-06 MED ORDER — CARVEDILOL 6.25 MG PO TABS
6.2500 mg | ORAL_TABLET | Freq: Two times a day (BID) | ORAL | Status: DC
  Administered 2022-02-06 – 2022-02-07 (×3): 6.25 mg via ORAL
  Filled 2022-02-06 (×3): qty 1

## 2022-02-06 MED ORDER — NALOXONE HCL 0.4 MG/ML IJ SOLN
0.4000 mg | Freq: Once | INTRAMUSCULAR | Status: AC
  Administered 2022-02-06: 0.4 mg via INTRAVENOUS

## 2022-02-06 MED ORDER — OXYBUTYNIN CHLORIDE 5 MG PO TABS
5.0000 mg | ORAL_TABLET | Freq: Three times a day (TID) | ORAL | Status: DC
  Administered 2022-02-06 – 2022-02-07 (×4): 5 mg via ORAL
  Filled 2022-02-06 (×4): qty 1

## 2022-02-06 MED ORDER — ESCITALOPRAM OXALATE 10 MG PO TABS
20.0000 mg | ORAL_TABLET | Freq: Every day | ORAL | Status: DC
  Administered 2022-02-06 – 2022-02-07 (×2): 20 mg via ORAL
  Filled 2022-02-06 (×2): qty 2

## 2022-02-06 MED ORDER — UMECLIDINIUM BROMIDE 62.5 MCG/ACT IN AEPB
1.0000 | INHALATION_SPRAY | Freq: Every day | RESPIRATORY_TRACT | Status: DC
  Administered 2022-02-07: 1 via RESPIRATORY_TRACT
  Filled 2022-02-06: qty 7

## 2022-02-06 NOTE — Progress Notes (Addendum)
Daily Progress Note Intern Pager: [phone]  Patient name: Tiffany [name] Medical record number: [id number] Date of birth: 1960/10/31 Age: 62 y.o. Gender: female  Primary Care Provider: Leslie Dales, DO Consultants: None (seen by CCM for rapid response) Code Status: Full code  Pt Overview and Major Events to Date:  1/19 admitted 1/22 rapid response called for unresponsiveness, received narcan, returned to baseline  Assessment and Plan:  Tiffany [name] is a 62 y.o. female p/w SOB and leg swelling, being managed for CHF exacerbation. Has also had worsening renal function over the past few months.   PMHx includes CAD s/p MI x2, CHF, HTN, HLD, CKD, schizophrenia/schizoaffective disorder, asthma/COPD, stroke, GERD       * Acute on chronic diastolic heart failure (HCC) BNP elevated, orthopnea, SOB, with BLE.(Home Torsemide '20mg'$  BID). Echo with normal EF (60-65%), G2DD.Severely dilated LA. Net negative -3.1L last 24h. Wt last year 34kg. Will continue to diurese. -Lasix 60 mg IV today, likely PO tomorrow -Cardiac monitoring -Strict I/O, daily weights -f/u BMP  AMS (altered mental status) Patient noted to be unresponsive overnight and had pinpoint pupils.  Responded to 3.4 mg total of Narcan.  Unclear etiology, no opioid administration during this stay, denies taking anything at home.  Patient did receive Lyrica earlier in the night but this is a home med.  UDS positive for cocaine and benzos. Concern for ?ingestion during her stay vs prior to arrival. Valium noted as a home med previously.  -Continue to monitor, reevaluate in PM, consider CT head if neuro deficits  -limit/document visitors -NPO pending swallow screen  Dyspnea Does have COPD and OSA overlap syndrome on CPAP at night per chart review. CT chest with RUL opacity that could represent infection, inflammation, or possibly neoplasm.  Off abx. -Monitor for fever -f/u sputum cx - repeat imaging in 3 months to follow  RUL opacity  - cpap at night - f/u CBC   Chronic kidney disease (CKD), stage IV (severe) (HCC) Cr 2.62 from 2.77 yesterday. baseline was 1.5 but Cr of 2.48 last month. Likely AKI and worsening of CKD. Taking torsemide at home, recently increased dosing due to leg swelling, this may be contributing. Renal US showing chronic kidney disease. No hydronephrosis. UA unremarkable.  -Diuresis as above -f/u BMP  Physical deconditioning Wheelchair bound at home.  -PT/OT  Abdominal pain Endorses abdominal pain overnight. No acute abdomen on exam. Likely 2/2 constipation. Was given soap suds enema x1 with good response - Miralax daily - continue to monitor     Restarting home inhalers and Lexapro. Restarted coreg, uloric   FEN/GI: N.p.o. pending swallow screen PPx: Lovenox Dispo: Pending continued diuresis. SNF recommended but patient prefers [facility].  Subjective:  Overnight, was unresponsive and received Narcan. This morning, patient asleep and only briefly opens her eyes to continued stimuli. RN messaged to please inform us if she does not awaken later this morning  Update 1220: Reevaluated pt at bedside, she is more awake now and states she is upset that she has not had many of her home medications. We reviewed her medication list and restarted Coreg, lexapro, uloric Objective: Temp:  [98.6 F (37 C)-99.5 F (37.5 C)] 99.5 F (37.5 C) (01/22 0809) Pulse Rate:  [55-63] 58 (01/22 1100) Resp:  [15-23] 19 (01/22 1100) BP: (139-165)/(74-104) 150/75 (01/22 1100) SpO2:  [94 %-99 %] 94 % (01/22 1100) Physical Exam: General: NAD, asleep, briefly opens her eyes to verbal/tactile stimuli Cardiovascular: RRR, systolic murmur Respiratory: CTAB on  3L [place] Abdomen: non distended Extremities: 1-2+ b/l pitting edema  Laboratory: Most recent CBC Lab Results  Component Value Date   WBC 5.4 02/06/2022   HGB 9.8 (L) 02/06/2022   HCT 30.5 (L) 02/06/2022   MCV 89.4 02/06/2022   PLT 286 02/06/2022    Most recent BMP    Latest Ref Rng & Units 02/06/2022    6:41 AM  BMP  Glucose 70 - 99 mg/dL 84   BUN 8 - 23 mg/dL 39   Creatinine 0.44 - 1.00 mg/dL 2.62   Sodium 135 - 145 mmol/L 141   Potassium 3.5 - 5.1 mmol/L 3.5   Chloride 98 - 111 mmol/L 105   CO2 22 - 32 mmol/L 23   Calcium 8.9 - 10.3 mg/dL 8.7     UDS +cocaine, benzos   Tiffany Albino, MD 02/06/2022, 12:46 PM  PGY-1, [place] Intern pager: [phone], text pages welcome Secure chat group [place]

## 2022-02-06 NOTE — Significant Event (Signed)
Rapid Response Event Note   Reason for Call :  Unresponsiveness  Initial Focused Assessment:  Pt lying in bed with eyes closed, in no visible distress. Pt is resistant to eye opening but is otherwise completely unresponsive. Pupils 1 and equal. Lungs diminished t/o. Skin warm and dry.  T-98.7,HR-62, BP-151/90, RR-18, SpO2-96% on 3L [place].  Narcan 0.4 given with a minimal response. Narcan '1mg'$  given with a minimal response. Narcan '2mg'$  given with a minimal response however after a few minutes, pt woke up and is back to baseline.    Interventions:  CBG-109 PIV-R F/A ABG-7.36/49/85/28.1 Narcan 0.'4mg'$  IV x 1 Narcan '1mg'$  IV x 1 Narcan '2mg'$  IV x 1 CBC/BMP UDS NPO Plan of Care:  Pt is back to baseline after total of 3.'4mg'$  narcan. Continue to monitor pt closely. Additional doses of narcan may be needed. Call RRT if further assistance needed.   Event Summary:   MD Notified: Drs. Larae Grooms and Miller notified and came to bedside, PCCM consulted and came to bedside Call [place] Time: [phone] Dillard Essex, RN

## 2022-02-06 NOTE — Progress Notes (Signed)
RT responded to rapid response. Pt unarousable. ABG 7.37 49 85 28. Pt responsive after multiple doses of Narcan.

## 2022-02-06 NOTE — Assessment & Plan Note (Addendum)
Patient noted to be unresponsive overnight and had pinpoint pupils.  Responded to 3.4 mg total of Narcan.  Unclear etiology, no opioid administration during this stay, denies taking anything at home.  Patient did receive Lyrica earlier in the night but this is a home med.  UDS positive for cocaine and benzos. Concern for ?ingestion during her stay vs prior to arrival. Valium noted as a home med previously.  -Continue to monitor, reevaluate in PM, consider CT head if neuro deficits  -limit/document visitors -NPO pending swallow screen

## 2022-02-06 NOTE — Progress Notes (Signed)
Called to rapid response for patient being unresponsive.   Vitals all within normal limits.  ABG mild hypercarbia  Received 0.4 mg narcan, then '1mg'$  narcan, was briefly awake, then again unarousable.  Breathing normally.  On my arrival, completely non responsive with some minimal resistance to eye opening,  NO withdrawal at all to nail bed stim or sternal rub.   Pinpoint pupils.  Given '2mg'$  narcan.  After about 1 min, awake and alert.   Back to baseline.  Denies taking any pills from home, other drugs.  No opiates given here.  Hx of positive UDS in past, but she is not mobile and her belongings were on the other side of room, no visitors.   Says she remembers the whole thing but was sleeping.   Will cont to monitor her on progressive unit.  Consider narcan gtt if continues to need narcan.  Please call us back at [phone] pager if need additional help.

## 2022-02-06 NOTE — Progress Notes (Addendum)
FMTS Interim Progress Note  Received page by RN new onset unresponsiveness with last known normal 1 hour prior.  Assessed patient with Dr. Larae Grooms.  On arrival patient sleepy, not roused to voice or noxious stimuli.  Her vital signs were stable and she was not in respiratory distress.    Patient received 100 mg Lyrica earlier in the night around 0030.  Per MAR she normally takes 100 mg Lyrica 3 times daily at home.  Stat ABG WNL Gave 0.4 mg Narcan without response Called CCM who recommended giving 1 mg Narcan, given without response  Today's Vitals   02/06/22 0607 02/06/22 0612 02/06/22 0617 02/06/22 0621  BP: (!) 164/90 (!) 158/104 (!) 165/88 (!) 165/84  Pulse: (!) 58 (!) 59 61 60  Resp: '16 18 19 19  '$ Temp:      TempSrc:      SpO2: 99% 98% 99% 98%  Weight:      Height:      PainSc:       Body mass index is 34.36 kg/m. Neuro: Pupils pinpoint and nonreactive, does not withdraw to pain, does not awaken to sternal rub Cardio: Regular rate, regular rhythm, no murmurs on exam Pulm: Clear, no wheezing, no crackles.  No increased work of breathing  AMS: Most likely sedation secondary to Lyrica.  Patient appears to be protecting her airway and is not in respiratory distress.  Will continue to monitor mental status for improvement.  -CCM aware of patient  -Change level of care to progressive -Neurochecks every 2 hours -Continue current oxygen therapy -NPO  Addendum 0645: Patient back to baseline after 2 mg Narcan. (Total 3.4 mg) Cancelled CT Head   Darci Current, DO 02/06/2022, 6:20 AM PGY-1, [place] Medicine Service pager [phone]

## 2022-02-06 NOTE — Progress Notes (Signed)
PT Cancellation Note  Patient Details Name: Tiffany [name] MRN: [id number] DOB: 1960-10-13   Cancelled Treatment:    Reason Eval/Treat Not Completed: Other (comment).  Reported to MD and nursing pt verbalized to PT that she did not want to be bothered by PT until 4-5 PM today.  Declining bed level session.  Reclosed her eyes.   Ramond Dial 02/06/2022, 11:59 AM  Mee Hives, PT PhD Acute Rehab Dept. Number: [place]

## 2022-02-07 ENCOUNTER — Other Ambulatory Visit (HOSPITAL_COMMUNITY): Payer: Self-pay

## 2022-02-07 DIAGNOSIS — I5033 Acute on chronic diastolic (congestive) heart failure: Secondary | ICD-10-CM | POA: Diagnosis not present

## 2022-02-07 LAB — BASIC METABOLIC PANEL
Anion gap: 5 (ref 5–15)
BUN: 36 mg/dL — ABNORMAL HIGH (ref 8–23)
CO2: 28 mmol/L (ref 22–32)
Calcium: 8.2 mg/dL — ABNORMAL LOW (ref 8.9–10.3)
Chloride: 107 mmol/L (ref 98–111)
Creatinine, Ser: 2.7 mg/dL — ABNORMAL HIGH (ref 0.44–1.00)
GFR, Estimated: 19 mL/min — ABNORMAL LOW (ref >60)
Glucose, Bld: 117 mg/dL — ABNORMAL HIGH (ref 70–99)
Potassium: 3.5 mmol/L (ref 3.5–5.1)
Sodium: 140 mmol/L (ref 135–145)

## 2022-02-07 LAB — CBC
HCT: 25.8 % — ABNORMAL LOW (ref 36.0–46.0)
Hemoglobin: 8.3 g/dL — ABNORMAL LOW (ref 12.0–15.0)
MCH: 29.2 pg (ref 26.0–34.0)
MCHC: 32.2 g/dL (ref 30.0–36.0)
MCV: 90.8 fL (ref 80.0–100.0)
Platelets: 232 10*3/uL (ref 150–400)
RBC: 2.84 MIL/uL — ABNORMAL LOW (ref 3.87–5.11)
RDW: 15.8 % — ABNORMAL HIGH (ref 11.5–15.5)
WBC: 4.8 10*3/uL (ref 4.0–10.5)
nRBC: 0 % (ref 0.0–0.2)

## 2022-02-07 LAB — MAGNESIUM: Magnesium: 1.9 mg/dL (ref 1.7–2.4)

## 2022-02-07 MED ORDER — CARVEDILOL 6.25 MG PO TABS
6.2500 mg | ORAL_TABLET | Freq: Two times a day (BID) | ORAL | 0 refills | Status: DC
  Filled 2022-02-07: qty 60, 30d supply, fill #0

## 2022-02-07 MED ORDER — AMLODIPINE BESYLATE 10 MG PO TABS
10.0000 mg | ORAL_TABLET | Freq: Every day | ORAL | 0 refills | Status: DC
  Filled 2022-02-07: qty 30, 30d supply, fill #0

## 2022-02-07 MED ORDER — POTASSIUM CHLORIDE 20 MEQ PO PACK
40.0000 meq | PACK | Freq: Once | ORAL | Status: AC
  Administered 2022-02-07: 40 meq via ORAL
  Filled 2022-02-07: qty 2

## 2022-02-07 MED ORDER — TORSEMIDE 20 MG PO TABS
20.0000 mg | ORAL_TABLET | Freq: Every day | ORAL | 1 refills | Status: DC
  Filled 2022-02-07: qty 90, 90d supply, fill #0

## 2022-02-07 MED ORDER — ACETAMINOPHEN 325 MG PO TABS
650.0000 mg | ORAL_TABLET | Freq: Four times a day (QID) | ORAL | Status: DC | PRN
  Administered 2022-02-07 (×2): 650 mg via ORAL
  Filled 2022-02-07 (×2): qty 2

## 2022-02-07 NOTE — Evaluation (Signed)
Physical Therapy Evaluation Patient Details Name: Tiffany [name] MRN: [id number] DOB: Jun 26, 1960 Today's Date: 02/07/2022  History of Present Illness  Tiffany [name] is a 62 y.o. female presenting with shortness of breath and leg swelling, with concern for CAP and CHF exacerbation. Has also had worsening renal function over the past few months. PMHx includes CAD s/p MI x2, CHF, HTN, HLD, CKD, schizophrenia/schizoaffective disorder, asthma/COPD, stroke, GERD, chronic pain syndrom, anoxic brain injury, PTSD, CVA and polysubstance use.   Clinical Impression  Patient received in bed she is agreeable to PT assessment. [name] pleasant. Patient is mod I with bed mobility and supervision for transfer from bed to chair and back to bed. She performed lateral scoot. Patient is not ambulatory at baseline. She has aide who comes 4 hours a day 7 days a week and uses EMS when she needs to leave apartment due to steps to enter.  Patient appears to be close to baseline level of function.  She will continue to benefit from skilled PT to maximize strength and independence for return home.          Recommendations for follow up therapy are one component of a multi-disciplinary discharge planning process, led by the attending physician.  Recommendations may be updated based on patient status, additional functional criteria and insurance authorization.  Follow Up Recommendations Home health PT      Assistance Recommended at Discharge Intermittent Supervision/Assistance  Patient can return home with the following  A little help with walking and/or transfers;Assist for transportation;Help with stairs or ramp for entrance;Assistance with cooking/housework    Equipment Recommendations None recommended by PT  Recommendations for Other Services       Functional Status Assessment Patient has had a recent decline in their functional status and demonstrates the ability to make significant improvements in function in a  reasonable and predictable amount of time.     Precautions / Restrictions Precautions Precautions: Fall Precaution Comments: Non-ambulatory at baseline Restrictions Weight Bearing Restrictions: No      Mobility  Bed Mobility Overal bed mobility: Modified Independent Bed Mobility: Supine to Sit, Sit to Supine, Rolling Rolling: Modified independent (Device/Increase time)   Supine to sit: Modified independent (Device/Increase time) Sit to supine: Modified independent (Device/Increase time)        Transfers Overall transfer level: Modified independent Equipment used: None               General transfer comment: patient perfromed lateral scoot from bed to chair and back to bed.    Ambulation/Gait               General Gait Details: not ambulatory at baseline, but states she is working towards being able to walk again with HHPT  Stairs            Wheelchair Mobility    Modified Rankin (Stroke Patients Only)       Balance Overall balance assessment: Needs assistance, History of Falls Sitting-balance support: Feet supported Sitting balance-Leahy Scale: Good Sitting balance - Comments: supervision for sitting balance at edge of bed and during transfer                                     Pertinent Vitals/Pain Pain Assessment Pain Assessment: Faces Faces Pain Scale: Hurts a little bit Pain Location: BLEs- "neuropathy" Pain Descriptors / Indicators: Grimacing, Guarding, Moaning Pain Intervention(s): Monitored during session, Repositioned  Home Living Family/patient expects to be discharged to:: Private residence Living Arrangements: Alone Available Help at Discharge: Personal care attendant;Available PRN/intermittently Type of Home: Apartment Home Access: Stairs to enter   Entrance Stairs-Number of Steps: 3 patient has EMS take her in and out if needed.   Home Layout: One level Home Equipment: Rollator (4  wheels);BSC/3in1;Wheelchair - power;Wheelchair - manual Additional Comments: Pt reports that she sleeps on the couch    Prior Function Prior Level of Function : Needs assist;History of Falls (last six months)       Physical Assist : ADLs (physical)   ADLs (physical): IADLs;Dressing;Bathing Mobility Comments: Pt reports that she mobilizes herself from couch to wheelchair and uses manual WC ad lib to keep up strength in UEs and LEs. Positive for falls when transferring. ADLs Comments: Pt reports that her Aide helps her with "Everything". Pt reports that talking fatigues her and did not give full picture, but pt reports Aide assists with bathing and dressing and pt usually can attend to her toileting needs. Pt reports Aide provides all IADLs.     Hand Dominance        Extremity/Trunk Assessment   Upper Extremity Assessment Upper Extremity Assessment: Defer to OT evaluation    Lower Extremity Assessment Lower Extremity Assessment: Generalized weakness       Communication      Cognition Arousal/Alertness: Awake/alert Behavior During Therapy: WFL for tasks assessed/performed Overall Cognitive Status: Within Functional Limits for tasks assessed                                          General Comments      Exercises     Assessment/Plan    PT Assessment Patient needs continued PT services  PT Problem List Decreased strength;Decreased activity tolerance;Decreased mobility;Cardiopulmonary status limiting activity       PT Treatment Interventions Functional mobility training;Therapeutic activities;Patient/family education;Therapeutic exercise    PT Goals (Current goals can be found in the Care Plan section)  Acute Rehab PT Goals Patient Stated Goal: to go home PT Goal Formulation: With patient Time For Goal Achievement: 02/15/22 Potential to Achieve Goals: Good    Frequency Min 3X/week     Co-evaluation               AM-PAC PT "6 Clicks"  Mobility  Outcome Measure Help needed turning from your back to your side while in a flat bed without using bedrails?: None Help needed moving from lying on your back to sitting on the side of a flat bed without using bedrails?: None Help needed moving to and from a bed to a chair (including a wheelchair)?: A Little Help needed standing up from a chair using your arms (e.g., wheelchair or bedside chair)?: A Lot Help needed to walk in hospital room?: Total Help needed climbing 3-5 steps with a railing? : Total 6 Click Score: 15    End of Session Equipment Utilized During Treatment: Oxygen Activity Tolerance: Patient tolerated treatment well Patient left: in bed;with call bell/phone within reach;with nursing/sitter in room Nurse Communication: Mobility status PT Visit Diagnosis: Muscle weakness (generalized) (M62.81);Other abnormalities of gait and mobility (R26.89);Pain Pain - Right/Left:  (B) Pain - part of body: Leg;Ankle and joints of foot    Time: 7989-2119 PT Time Calculation (min) (ACUTE ONLY): 17 min   Charges:   PT Evaluation $PT Eval Moderate Complexity: 1 Mod  Molina Hollenback, PT, GCS 02/07/22,11:16 AM

## 2022-02-07 NOTE — Plan of Care (Signed)
  Problem: Health Behavior/Discharge Planning: Goal: Ability to manage health-related needs will improve Outcome: Adequate for Discharge   Problem: Clinical Measurements: Goal: Ability to maintain clinical measurements within normal limits will improve Outcome: Adequate for Discharge Goal: Will remain free from infection Outcome: Adequate for Discharge Goal: Diagnostic test results will improve Outcome: Adequate for Discharge Goal: Respiratory complications will improve Outcome: Adequate for Discharge Goal: Cardiovascular complication will be avoided Outcome: Adequate for Discharge   Problem: Activity: Goal: Risk for activity intolerance will decrease Outcome: Adequate for Discharge   Problem: Nutrition: Goal: Adequate nutrition will be maintained Outcome: Adequate for Discharge   Problem: Coping: Goal: Level of anxiety will decrease Outcome: Adequate for Discharge   Problem: Elimination: Goal: Will not experience complications related to bowel motility Outcome: Adequate for Discharge Goal: Will not experience complications related to urinary retention Outcome: Adequate for Discharge   Problem: Pain Managment: Goal: General experience of comfort will improve Outcome: Adequate for Discharge   Problem: Safety: Goal: Ability to remain free from injury will improve Outcome: Adequate for Discharge   Problem: Skin Integrity: Goal: Risk for impaired skin integrity will decrease Outcome: Adequate for Discharge   Problem: Education: Goal: Ability to demonstrate management of disease process will improve Outcome: Adequate for Discharge Goal: Ability to verbalize understanding of medication therapies will improve Outcome: Adequate for Discharge Goal: Individualized Educational Video(s) Outcome: Adequate for Discharge   Problem: Activity: Goal: Capacity to carry out activities will improve Outcome: Adequate for Discharge   Problem: Cardiac: Goal: Ability to achieve and  maintain adequate cardiopulmonary perfusion will improve Outcome: Adequate for Discharge

## 2022-02-07 NOTE — Discharge Instructions (Addendum)
Dear Tiffany [name],  Thank you for letting us participate in your care. You were hospitalized for  Acute on chronic diastolic heart failure ([place]). You were treated with diuretics.   POST-HOSPITAL & CARE INSTRUCTIONS Please review your medication list for any updates. Go to your follow up appointments (listed below)   DOCTOR'S APPOINTMENT   Future Appointments  Date Time Provider [place]  02/09/2022  9:10 AM ACCESS TO CARE POOL [facility] [place] Follow up.   Specialty: Home Health Services Contact information: [name] [place] [phone]                 Take care and be well!  [facility]  [place], [place] [address] [phone]

## 2022-02-07 NOTE — TOC Transition Note (Signed)
Transition of Care (TOC) - CM/SW Discharge Note Marvetta Gibbons RN, BSN Transitions of Care Unit 4E- RN Case Manager See Treatment Team for direct phone # Cross coverage for [place]  Patient Details  Name: Tiffany [name] MRN: [id number] Date of Birth: 06-04-1960  Transition of Care [facility]) CM/SW Contact:  Dawayne Patricia, RN Phone Number: 02/07/2022, 2:59 PM   Clinical Narrative:    Pt stable for transition home, HHPT/OT have been placed- call made to [facility] w/ [facility] and confirmed [facility] can resume services.   Per [name] pt will need EMS transport home as she does not have her wheelchair here and no one to assist her to get home, address confirmed in epic.   [facility] called for transport- pt has been placed on list- per dispatch there a multiple patients ahead and it may be "awhile".  Paperwork has been placed on chart and bedside RN aware.   No further TOC needs noted   Final next level of care: Home w Home Health Services Barriers to Discharge: Barriers Resolved   Patient Goals and CMS Choice CMS Medicare.gov Compare Post Acute Care list provided to:: Patient Choice offered to / list presented to : Patient  Discharge Placement                 Home w/ [facility]        Discharge Plan and Services Additional resources added to the After Visit Summary for     Discharge Planning Services: CM Consult Post Acute Care Choice: NA          DME Arranged: N/A DME Agency: NA       HH Arranged: PT, OT HH Agency: [facility] Date [place] Agency Contacted: 02/07/22 Time [place]: [id number] Representative spoke with at [place]: [place] Determinants of Health ([place]) Interventions SDOH Screenings   Food Insecurity: Unknown (02/05/2022)  Housing: Medium Risk (02/07/2022)  Transportation Needs: Unmet Transportation Needs (02/07/2022)  Utilities: Unknown (02/05/2022)  Alcohol Screen: Low Risk  (07/24/2021)  Depression (PHQ2-9): Medium Risk (07/24/2021)  Financial Resource  Strain: Medium Risk (07/24/2021)  Physical Activity: Inactive (07/24/2021)  Social Connections: Moderately Isolated (07/24/2021)  Stress: Stress Concern Present (07/24/2021)  Tobacco Use: Medium Risk (02/03/2022)     Readmission Risk Interventions    02/07/2022    2:22 PM 05/24/2021    6:19 PM 07/26/2020    2:21 PM  Readmission Risk Prevention Plan  Transportation Screening Complete Complete Complete  Medication Review Press photographer) Complete Referral to Pharmacy Complete  PCP or Specialist appointment within 3-5 days of discharge Complete Complete Complete  HRI or Home Care Consult Complete Complete Complete  SW Recovery Care/Counseling Consult Complete Complete Complete  Palliative Care Screening Not Applicable  Not Applicable  [place] Not Applicable Complete Complete

## 2022-02-07 NOTE — Discharge Summary (Addendum)
[facility] Discharge Summary  Patient name: Tiffany [name] Medical record number: [id number] Date of birth: 1960/06/04 Age: 62 y.o. Gender: female Date of Admission: 02/03/2022  Date of Discharge: 02/07/22 Admitting Physician: Blane Ohara McDiarmid, MD  Primary Care Provider: Leslie Dales, DO Consultants: none  Indication for Hospitalization: CHF  Discharge Diagnoses/Problem List:  Principal Problem for Admission: CHF Other Problems addressed during stay:  Principal Problem:   Acute on chronic diastolic heart failure ([place]) Active Problems:   AMS (altered mental status)   Chronic kidney disease (CKD), stage IV (severe) ([place])   Dyspnea   Abdominal pain   Physical deconditioning   Pneumonia    Brief Hospital Course:  Tiffany [name] is a 62 y.o.female with a history of CAD s/p MI x2, CHF, HTN, HLD, CKD, schizophrenia/schizoaffective disorder, asthma/COPD, stroke, GERD  who was admitted to the [facility] Teaching Service at [facility] for CHF exacerbation. Her hospital course is detailed below:  Acute on chronic CHF  dyspnea Presented with worsening dyspnea, orthopnea, lower extremity edema.  Refractory to outpatient management with torsemide 20 mg twice daily.  CXR with interstitial edema.  Echo with normal EF (60 to 65%), G2 DD, severely dilated LA.  Pulmonary artery hypertension.  Received diuresis with IV Lasix with improvement.    Sent home with Torsemide '20mg'$  daily.  On admission, there was concern for possible CAP given productive cough with changes on imaging, so she received IV ceftriaxone and azithromycin which were then stopped.  Pro-Cal normal.   Right upper lobe opacity/nodule Noted to have 1.8 x 1.5 cm focal opacity in RUL, consider infectious versus inflammatory versus underlying neoplasm given smoking history.  Recommend follow-up imaging in 3 months per radiology read.   Altered mental status During her stay, rapid response was  called due to patient being unresponsive overnight.  Noted to have pinpoint pupils.  Responded to 3.4 mg total of Narcan, but remained drowsy until the morning after which she returned to baseline.  Unclear etiology, no opioid administration during this stay, UDS positive for cocaine and benzos but negative for opioids, denied taking any opioids at home.  Of note, patient did attempt to leave AMA before this happened, so there was some suspicion that she may have ingested something during this time, but this was not confirmed.  After this episode, patient remained stable.    Discontinued Lyrica on discharge.  CKD Patient noted to have worsening CKD over the past few months.  AKI likely in the setting of increasing home torsemide.  Renal ultrasound completed, showed chronic kidney disease with no hydronephrosis, UA unremarkable.  Recommend outpatient nephrology follow-up.   Other chronic conditions were medically managed with home medications and formulary alternatives as necessary  PCP Follow-up Recommendations:  PCP Follow Up:  Recommend follow-up imaging in 3 months for RUL opacity noted on CT (repeat chest CT, follow up PET-CT, or tissue sampling per radiology read) Reevaluate diuretic regimen given worsening CKD Outpatient nephrology follow up  Review psychotropic/sedating medications, recommend psych follow-up. Dc'd lyrica    Imaging:   ECHO 02/04/22: IMPRESSIONS    1. Left ventricular ejection fraction, by estimation, is 60 to 65%. The  left ventricle has normal function. The left ventricle has no regional  wall motion abnormalities. The left ventricular internal cavity size was  mildly dilated. There is mild left  ventricular hypertrophy. Left ventricular diastolic parameters are  consistent with Grade II diastolic dysfunction (pseudonormalization).  Elevated left atrial pressure.  2. Right ventricular systolic function is normal. The right ventricular  size is normal. There  is mildly elevated pulmonary artery systolic  pressure. The estimated right ventricular systolic pressure is 18.5 mmHg.   3. Left atrial size was severely dilated.   4. Right atrial size was mildly dilated.   5. The mitral valve is normal in structure. Trivial mitral valve  regurgitation. No evidence of mitral stenosis.   6. The aortic valve is tricuspid. Aortic valve regurgitation is not  visualized. Aortic valve sclerosis is present, with no evidence of aortic  valve stenosis.   7. The inferior vena cava is dilated in size with >50% respiratory  variability, suggesting right atrial pressure of 8 mmHg.    CXR IMPRESSION: 1. Shallow inspiration radiograph. 2. Cardiomegaly. 3. Prominence of the interstitial lung markings, suggesting interstitial edema. 4. No appreciable airspace consolidation.   CT CHEST WO CONTRAST IMPRESSION: 1. Focal opacity measuring 1.8 cm in the posterior segment of the right upper lobe which may be infectious or inflammatory. The possibility of underlying neoplasm can not be excluded. Consider one of the following in 3 months for both low-risk and high-risk individuals: (a) repeat chest CT, (b) follow-up PET-CT, or (c) tissue sampling. This recommendation follows the consensus statement: Guidelines for Management of Incidental Pulmonary Nodules Detected on CT Images: From the Fleischner Society 2017; Radiology 2017; 284:228-243. 2. Distended pulmonary trunk, suggesting underlying pulmonary artery hypertension. 3. Cardiomegaly. 4. Cholelithiasis.   US RENAL IMPRESSION: Increased echotexture compatible with chronic medical renal disease.   No hydronephrosis.  Disposition: home w/ HH  Discharge Condition: stable, improved   Discharge Exam:  Vitals:   02/07/22 1130 02/07/22 1134  BP: (!) 144/80   Pulse: (!) 50   Resp: 14   Temp: 98.5 F (36.9 C)   SpO2: 99% 97%   General: NAD, pleasant, able to participate in exam Cardiac: RRR, no  murmurs auscultated Respiratory: CTAB, normal WOB Abdomen: soft, non-tender, non-distended, normoactive bowel sounds Extremities: warm and well perfused, trace-1+ b/l lower extremity pitting edema Skin: warm and dry, no rashes noted Neuro: alert, no obvious focal deficits, speech normal Psych: Normal affect and mood   Significant Procedures: n/a  Significant Labs and Imaging:  Recent Labs  Lab 02/06/22 0131 02/06/22 0601 02/06/22 0641 02/07/22 0531  WBC 5.1  --  5.4 4.8  HGB 9.4* 8.8* 9.8* 8.3*  HCT 30.0* 26.0* 30.5* 25.8*  PLT 273  --  286 232   Recent Labs  Lab 02/06/22 0131 02/06/22 0601 02/06/22 0641 02/06/22 1400 02/07/22 0531  NA 138 141 141 141 140  K 3.5 3.4* 3.5 3.4* 3.5  CL 101  --  105 106 107  CO2 26  --  '23 26 28  '$ GLUCOSE 91  --  84 104* 117*  BUN 40*  --  39* 36* 36*  CREATININE 2.89*  --  2.62* 2.69* 2.70*  CALCIUM 8.7*  --  8.7* 8.7* 8.2*  MG  --   --   --  2.1 1.9    Results/Tests Pending at Time of Discharge: n/a  Discharge Medications:  Allergies as of 02/07/2022       Reactions   Hydrocodone Shortness Of Breath   Latuda [lurasidone Hcl] Anaphylaxis   Magnesium-containing Compounds Anaphylaxis, Other (See Comments)   Tolerated Ensure   Prednisone Anaphylaxis, Swelling, Other (See Comments)   Tongue swelling, lip swelling, throat swelling, per pt    Tramadol Anaphylaxis, Swelling, Rash   Codeine Nausea And Vomiting, Rash  Trazodone Other (See Comments)   paranoia   Peanut-containing Drug Products Hives   Raw peanuts, but can eat peanut butter   Topamax [topiramate] Other (See Comments)   Increases paranoia   Sulfa Antibiotics Itching   Tape Rash        Medication List     STOP taking these medications    cyclobenzaprine 10 MG tablet Commonly known as: FLEXERIL   diclofenac 75 MG EC tablet Commonly known as: VOLTAREN   diclofenac Sodium 1 % Gel Commonly known as: Voltaren   pregabalin 100 MG capsule Commonly known as:  LYRICA   Vyvanse 10 MG capsule Generic drug: lisdexamfetamine       TAKE these medications    albuterol 108 (90 Base) MCG/ACT inhaler Commonly known as: VENTOLIN HFA Inhale 2 puffs into the lungs every 6 (six) hours as needed for wheezing or shortness of breath.   amLODipine 10 MG tablet Commonly known as: NORVASC Take 1 tablet (10 mg total) by mouth daily. Start taking on: February 08, 2022 What changed:  medication strength how much to take   atorvastatin 20 MG tablet Commonly known as: LIPITOR Take 1 tablet (20 mg total) by mouth daily.   carvedilol 6.25 MG tablet Commonly known as: COREG Take 1 tablet (6.25 mg total) by mouth 2 (two) times daily with a meal. What changed: See the new instructions.   colchicine 0.6 MG tablet TAKE 2 TABS (1.2 MG) ORALLY AT THE ONSET OF GOUT FLARE, MAY REPEAT 1 TAB (0.6 MG) 1 HOUR IF SYMPTOMS PERSIST What changed:  how much to take how to take this when to take this reasons to take this additional instructions   dicyclomine 10 MG capsule Commonly known as: Bentyl Take 1 capsule (10 mg total) by mouth 3 (three) times daily before meals.   DULoxetine 30 MG capsule Commonly known as: CYMBALTA Take 30 mg by mouth daily.   escitalopram 20 MG tablet Commonly known as: LEXAPRO Take 20 mg by mouth daily.   febuxostat 40 MG tablet Commonly known as: ULORIC Take 1 tablet (40 mg total) by mouth daily.   Iron (Ferrous Sulfate) 325 (65 Fe) MG Tabs Take 325 mg by mouth daily.   loperamide 2 MG capsule Commonly known as: IMODIUM Take 2 capsules (4 mg total) by mouth 3 (three) times daily as needed for diarrhea or loose stools.   montelukast 10 MG tablet Commonly known as: SINGULAIR Take 10 mg by mouth at bedtime.   OLANZapine 5 MG tablet Commonly known as: ZYPREXA Take 5 mg by mouth daily.   omeprazole 40 MG capsule Commonly known as: PRILOSEC Take 1 capsule (40 mg total) by mouth every morning. What changed: when to take  this   oxybutynin 5 MG tablet Commonly known as: DITROPAN Take 1 tablet (5 mg total) by mouth 2 (two) times daily. What changed: when to take this   potassium chloride SA 20 MEQ tablet Commonly known as: KLOR-CON M Take 40 mEq by mouth 2 (two) times daily.   prazosin 2 MG capsule Commonly known as: MINIPRESS Take 1 capsule (2 mg total) by mouth at bedtime.   Saphris 5 MG Subl 24 hr tablet Generic drug: asenapine Place 5 mg under the tongue 2 (two) times daily.   torsemide 20 MG tablet Commonly known as: DEMADEX Take 1 tablet (20 mg total) by mouth daily. What changed: See the new instructions.   Trelegy Ellipta 100-62.5-25 MCG/ACT Aepb Generic drug: Fluticasone-Umeclidin-Vilant Inhale 1 puff into the lungs  daily.        Discharge Instructions: Please refer to Patient Instructions section of EMR for full details.  Patient was counseled important signs and symptoms that should prompt return to medical care, changes in medications, dietary instructions, activity restrictions, and follow up appointments.   Follow-Up Appointments:  [facility] access to care 1/25 9:10AM   Follow-up Information     [facility] Follow up.   Specialty: Home Health Services Contact information: [name] [place] [address] [phone]                 August Albino, MD 02/07/2022, 12:10 PM PGY-1, [facility] Upper-Level Resident Addendum   I have independently interviewed and examined the patient. I have discussed the above with the original author and agree with their documentation. My edits for correction/addition/clarification are in within the document. Please see also any attending notes.   Rise Patience, DO  PGY-3, [facility] 02/07/2022 12:12 PM  FPTS Service pager: [phone] (text pages welcome through [facility])

## 2022-02-07 NOTE — Progress Notes (Signed)
[name] received consult for housing and transportation resources. [name] met with patient at bedside. [name] offered patient transportation and housing resources. Patient accepted. Patient reports she comes from home alone. Patient reports she arrived by [facility] and will need PTAR back home. Patient reports she does not have her wheelchair here will need to return by PTAR. TOC will help assist with transportation for patient when patient is medically ready for dc. All questions answered. No further questions reported at this time. SDOH screening complete.

## 2022-02-08 ENCOUNTER — Telehealth: Payer: Self-pay

## 2022-02-08 NOTE — Telephone Encounter (Signed)
Transition Care Management Follow-up Telephone Call Date of discharge and from where: [facility] 02/07/2022 How have you been since you were released from the hospital? better Any questions or concerns? Yes  Items Reviewed: Did the pt receive and understand the discharge instructions provided? Yes  Medications obtained and verified? Yes  Other? No  Any new allergies since your discharge? No  Dietary orders reviewed? Yes Do you have support at home? Yes   Home Care and Equipment/Supplies: Were home health services ordered? yes If so, what is the name of the agency? [facility]  Has the agency set up a time to come to the patient's home? yes Were any new equipment or medical supplies ordered?  No What is the name of the medical supply agency? N/a Were you able to get the supplies/equipment? no Do you have any questions related to the use of the equipment or supplies? No  Functional Questionnaire: (I = Independent and D = Dependent) ADLs: D  Bathing/Dressing- D  Meal Prep- D  Eating- I  Maintaining continence- D  Transferring/Ambulation- D  Managing Meds- D  Follow up appointments reviewed:  PCP Hospital f/u appt confirmed? Yes  Scheduled to see Dr Nelda Bucks on 02/09/2022 @ 9:10. [facility] f/u appt confirmed? No   Are transportation arrangements needed? No  If their condition worsens, is the pt aware to call PCP or go to the Emergency Dept.? Yes Was the patient provided with contact information for the PCP's office or ED? Yes Was to pt encouraged to call back with questions or concerns? Yes Juanda Crumble, LPN [place] Direct Dial [phone]

## 2022-02-09 ENCOUNTER — Inpatient Hospital Stay: Payer: Self-pay

## 2022-02-09 DIAGNOSIS — Z993 Dependence on wheelchair: Secondary | ICD-10-CM | POA: Diagnosis not present

## 2022-02-09 DIAGNOSIS — I6523 Occlusion and stenosis of bilateral carotid arteries: Secondary | ICD-10-CM | POA: Diagnosis not present

## 2022-02-09 DIAGNOSIS — E1122 Type 2 diabetes mellitus with diabetic chronic kidney disease: Secondary | ICD-10-CM | POA: Diagnosis not present

## 2022-02-09 DIAGNOSIS — J439 Emphysema, unspecified: Secondary | ICD-10-CM | POA: Diagnosis not present

## 2022-02-09 DIAGNOSIS — I130 Hypertensive heart and chronic kidney disease with heart failure and stage 1 through stage 4 chronic kidney disease, or unspecified chronic kidney disease: Secondary | ICD-10-CM | POA: Diagnosis not present

## 2022-02-09 DIAGNOSIS — E1141 Type 2 diabetes mellitus with diabetic mononeuropathy: Secondary | ICD-10-CM | POA: Diagnosis not present

## 2022-02-09 DIAGNOSIS — D631 Anemia in chronic kidney disease: Secondary | ICD-10-CM | POA: Diagnosis not present

## 2022-02-09 DIAGNOSIS — I5032 Chronic diastolic (congestive) heart failure: Secondary | ICD-10-CM | POA: Diagnosis not present

## 2022-02-09 DIAGNOSIS — Z556 Problems related to health literacy: Secondary | ICD-10-CM | POA: Diagnosis not present

## 2022-02-09 DIAGNOSIS — F32A Depression, unspecified: Secondary | ICD-10-CM | POA: Diagnosis not present

## 2022-02-09 DIAGNOSIS — F1491 Cocaine use, unspecified, in remission: Secondary | ICD-10-CM | POA: Diagnosis not present

## 2022-02-09 DIAGNOSIS — J4489 Other specified chronic obstructive pulmonary disease: Secondary | ICD-10-CM | POA: Diagnosis not present

## 2022-02-09 DIAGNOSIS — M5459 Other low back pain: Secondary | ICD-10-CM | POA: Diagnosis not present

## 2022-02-09 DIAGNOSIS — J9611 Chronic respiratory failure with hypoxia: Secondary | ICD-10-CM | POA: Diagnosis not present

## 2022-02-09 DIAGNOSIS — I252 Old myocardial infarction: Secondary | ICD-10-CM | POA: Diagnosis not present

## 2022-02-09 DIAGNOSIS — G8929 Other chronic pain: Secondary | ICD-10-CM | POA: Diagnosis not present

## 2022-02-09 DIAGNOSIS — I2510 Atherosclerotic heart disease of native coronary artery without angina pectoris: Secondary | ICD-10-CM | POA: Diagnosis not present

## 2022-02-09 DIAGNOSIS — M1990 Unspecified osteoarthritis, unspecified site: Secondary | ICD-10-CM | POA: Diagnosis not present

## 2022-02-09 DIAGNOSIS — E782 Mixed hyperlipidemia: Secondary | ICD-10-CM | POA: Diagnosis not present

## 2022-02-09 DIAGNOSIS — N1832 Chronic kidney disease, stage 3b: Secondary | ICD-10-CM | POA: Diagnosis not present

## 2022-02-16 DIAGNOSIS — M170 Bilateral primary osteoarthritis of knee: Secondary | ICD-10-CM | POA: Diagnosis not present

## 2022-02-16 DIAGNOSIS — Z9181 History of falling: Secondary | ICD-10-CM | POA: Diagnosis not present

## 2022-02-24 ENCOUNTER — Ambulatory Visit (INDEPENDENT_AMBULATORY_CARE_PROVIDER_SITE_OTHER): Payer: 59 | Admitting: Family Medicine

## 2022-02-24 ENCOUNTER — Ambulatory Visit: Payer: 59 | Admitting: Student

## 2022-02-24 VITALS — BP 130/60 | HR 59 | Ht 64.0 in

## 2022-02-24 DIAGNOSIS — I5032 Chronic diastolic (congestive) heart failure: Secondary | ICD-10-CM

## 2022-02-24 DIAGNOSIS — G4733 Obstructive sleep apnea (adult) (pediatric): Secondary | ICD-10-CM | POA: Diagnosis not present

## 2022-02-24 DIAGNOSIS — J9611 Chronic respiratory failure with hypoxia: Secondary | ICD-10-CM | POA: Diagnosis not present

## 2022-02-24 DIAGNOSIS — R062 Wheezing: Secondary | ICD-10-CM | POA: Diagnosis not present

## 2022-02-24 DIAGNOSIS — N1832 Chronic kidney disease, stage 3b: Secondary | ICD-10-CM | POA: Diagnosis not present

## 2022-02-24 DIAGNOSIS — J45909 Unspecified asthma, uncomplicated: Secondary | ICD-10-CM | POA: Diagnosis not present

## 2022-02-24 NOTE — Assessment & Plan Note (Signed)
>>  ASSESSMENT AND PLAN FOR STAGE 3B CHRONIC KIDNEY DISEASE ([place]) WRITTEN ON 02/24/2022  4:47 PM BY Rise Patience, DO  Increasing torsemide dose due to concern for volume overload, would need higher dosing in order to penetrate the kidneys for adequate diuresis. - BMP today - Nephrology referral placed (patient unclear if she has seen nephrology before)

## 2022-02-24 NOTE — Progress Notes (Signed)
    SUBJECTIVE:   CHIEF COMPLAINT / HPI:   Hospital follow-up: - Has been feeling like her muscles are aching - Doesn't wear her oxygen at home because she feels that she doesn't need it - Feels that her breathing is not as bad as it was when she went to the hospital, but is not doing well - Feels that her legs remain swollen - Uses her motorized wheelchair at home to get around - Is currently taking 2 '20mg'$  tablets of Torsemide daily  PERTINENT  PMH / PSH: HTN, CHF, COPD, T2DM, CKD stage IV  OBJECTIVE:   BP 130/60   Pulse (!) 59   Ht '5\' 4"'$  (1.626 m)   SpO2 99%   BMI 34.36 kg/m   Gen: chronically ill-appearing, appears older than stated age CV: RRR, no m/r/g appreciated, 2+ peripheral pitting edema in BLE Pulm: slight bibasilar crackles, no wheezing, transmitted upper airway sounds GI: soft, non-tender, non-distended  ASSESSMENT/PLAN:   Chronic diastolic congestive heart failure ([place]) Patient with increased weight since hospitalization with concern that patient may have been discharged on insufficient dose of diuresis. Is not using her home oxygen, which is not assisting her respiratory status but is reassuringly satting well on room air while resting. Has significant peripheral edema and some bibasilar crackles. Do feel that the patient is at high risk of needing re-hospitalization soon if not improving.  - Increase torsemide to '40mg'$  BID - BMP today - Monitor respiratory status closely - Discussed extensively with patient reasons to go the ER over the weekend  Chronic respiratory failure with hypoxia ([place]) Is using her inhalers but is not using home oxygen as prescribed. Discussed with patient the recommendation to continue using oxygen, especially if feeling short of breath.  Stage 3b chronic kidney disease (HCC) Increasing torsemide dose due to concern for volume overload, would need higher dosing in order to penetrate the kidneys for adequate diuresis. - BMP today -  Nephrology referral placed (patient unclear if she has seen nephrology before)     Tiffany [name], [place]

## 2022-02-24 NOTE — Assessment & Plan Note (Signed)
Is using her inhalers but is not using home oxygen as prescribed. Discussed with patient the recommendation to continue using oxygen, especially if feeling short of breath.

## 2022-02-24 NOTE — Assessment & Plan Note (Addendum)
Patient with increased weight since hospitalization with concern that patient may have been discharged on insufficient dose of diuresis. Is not using her home oxygen, which is not assisting her respiratory status but is reassuringly satting well on room air while resting. Has significant peripheral edema and some bibasilar crackles. Do feel that the patient is at high risk of needing re-hospitalization soon if not improving.  - Increase torsemide to 38m BID - BMP today - Monitor respiratory status closely - Discussed extensively with patient reasons to go the ER over the weekend

## 2022-02-24 NOTE — Assessment & Plan Note (Signed)
Increasing torsemide dose due to concern for volume overload, would need higher dosing in order to penetrate the kidneys for adequate diuresis. - BMP today - Nephrology referral placed (patient unclear if she has seen nephrology before)

## 2022-02-24 NOTE — Patient Instructions (Addendum)
I want you to increase your water pill (torsemide dose) to taking it twice daily over the weekend and then I want you to come back to the clinic to check your kidneys again and your breathing on Monday or Tuesday.  If your breathing gets worse over the weekend, I want you to go to the ER to get evaluated.

## 2022-02-25 LAB — BASIC METABOLIC PANEL
BUN/Creatinine Ratio: 24 (ref 12–28)
BUN: 69 mg/dL — ABNORMAL HIGH (ref 8–27)
CO2: 18 mmol/L — ABNORMAL LOW (ref 20–29)
Calcium: 9.6 mg/dL (ref 8.7–10.3)
Chloride: 107 mmol/L — ABNORMAL HIGH (ref 96–106)
Creatinine, Ser: 2.83 mg/dL — ABNORMAL HIGH (ref 0.57–1.00)
Glucose: 81 mg/dL (ref 70–99)
Potassium: 4.3 mmol/L (ref 3.5–5.2)
Sodium: 145 mmol/L — ABNORMAL HIGH (ref 134–144)
eGFR: 18 mL/min/{1.73_m2} — ABNORMAL LOW (ref >59)

## 2022-02-27 ENCOUNTER — Encounter: Payer: Self-pay | Admitting: Family Medicine

## 2022-02-27 ENCOUNTER — Ambulatory Visit (INDEPENDENT_AMBULATORY_CARE_PROVIDER_SITE_OTHER): Payer: 59 | Admitting: Family Medicine

## 2022-02-27 ENCOUNTER — Ambulatory Visit: Payer: Self-pay

## 2022-02-27 VITALS — BP 124/82 | HR 72 | Wt 218.8 lb

## 2022-02-27 DIAGNOSIS — N184 Chronic kidney disease, stage 4 (severe): Secondary | ICD-10-CM | POA: Diagnosis not present

## 2022-02-27 DIAGNOSIS — Z79899 Other long term (current) drug therapy: Secondary | ICD-10-CM

## 2022-02-27 DIAGNOSIS — I5032 Chronic diastolic (congestive) heart failure: Secondary | ICD-10-CM

## 2022-02-27 MED ORDER — OLANZAPINE 5 MG PO TABS: 5.0000 mg | ORAL_TABLET | Freq: Every day | ORAL | 3 refills | Status: DC

## 2022-02-27 MED ORDER — PREGABALIN 100 MG PO CAPS: 100.0000 mg | ORAL_CAPSULE | Freq: Two times a day (BID) | ORAL | 2 refills | Status: DC

## 2022-02-27 MED ORDER — LOPERAMIDE HCL 2 MG PO CAPS: 4.0000 mg | ORAL_CAPSULE | Freq: Three times a day (TID) | ORAL | 2 refills | Status: DC | PRN

## 2022-02-27 MED ORDER — PRAZOSIN HCL 2 MG PO CAPS: ORAL_CAPSULE | ORAL | 2 refills | Status: DC

## 2022-02-27 MED ORDER — TORSEMIDE 20 MG PO TABS: 60.0000 mg | ORAL_TABLET | Freq: Every day | ORAL | 11 refills | Status: DC

## 2022-02-27 NOTE — Progress Notes (Signed)
Patient presents to clinic reporting increased difficulties with breathing. Patient brought back in wheelchair.   Initial Sp02 was 88% RA. After several minutes, oxygen saturation improved to 94-97% on room air. Patient has history of COPD.   Dr. Andria Frames made aware.   Talbot Grumbling, RN

## 2022-02-27 NOTE — Assessment & Plan Note (Signed)
I cleaned up med list as best I could.  I believe her med list is now mostly accurate.

## 2022-02-27 NOTE — Progress Notes (Signed)
    SUBJECTIVE:   CHIEF COMPLAINT / HPI:   CC was SOB and body aches.  Addressed two major issues: Polypharm.  Brought in her meds.  Review took quite some time and I'm not sure I got it all correct.  Eliminated several drugs.  Added pregabalin .  She had old diazepam, which I did not add or refill, even though she said "I need it."  States she needed all her meds refilled.  I refilled several that seemed to need it as I went through her many prescription bottles. SOB.  Hx of HfpEF.  No chest pain.  States she has taken her demedex.  Also has CKD Stage 4 based on BMP of 2/9/      OBJECTIVE:   BP 124/82   Pulse 72   Wt 218 lb 12.8 oz (99.2 kg)   SpO2 96%   BMI 37.56 kg/m   Note weight gain of18 lbs since 02/05/22 and 64 lbs sine Sept 2023.   Lungs clear Cardiac RRR without m or g Ext 4 + bilateral edema.  ASSESSMENT/PLAN:   Chronic kidney disease (CKD), stage IV (severe) (HCC) Contributing to her anasarca.  See CHF.    Chronic diastolic congestive heart failure (HCC) Unclear dry weight.  My best guess is 180 lbs which puts her at least 40 lbs up.  Needs vigorous diuresis.  Increase torsemide from 20 mg daily to 60 mg daily.  FU in three days to be sure we have begun a diuresis.  Polypharmacy I cleaned up med list as best I could.  I believe her med list is now mostly accurate.     Zenia Resides, MD [place]

## 2022-02-27 NOTE — Assessment & Plan Note (Signed)
Unclear dry weight.  My best guess is 180 lbs which puts her at least 40 lbs up.  Needs vigorous diuresis.  Increase torsemide from 20 mg daily to 60 mg daily.  FU in three days to be sure we have begun a diuresis.

## 2022-02-27 NOTE — Assessment & Plan Note (Signed)
Contributing to her anasarca.  See CHF.

## 2022-02-27 NOTE — Patient Instructions (Signed)
I am very concerned about you.  I think you have at least 40 lbs of extra fluid.  If we don't do something about it, you will end up in the hospital or worse. The big change I am making is that I want you to take three toresemide tablets every day.  That should make you pee. I want you to come back in Thursday or Friday to make sure we are getting the fluid off.

## 2022-02-28 ENCOUNTER — Other Ambulatory Visit: Payer: Self-pay

## 2022-02-28 ENCOUNTER — Emergency Department (HOSPITAL_COMMUNITY): Payer: 59

## 2022-02-28 ENCOUNTER — Encounter (HOSPITAL_COMMUNITY): Payer: Self-pay

## 2022-02-28 ENCOUNTER — Inpatient Hospital Stay (HOSPITAL_COMMUNITY)
Admission: EM | Admit: 2022-02-28 | Discharge: 2022-03-06 | DRG: 291 | Disposition: A | Discharge status: Home Health | Payer: 59 | Source: Ambulatory Visit | Attending: Family Medicine | Admitting: Family Medicine

## 2022-02-28 DIAGNOSIS — Z1152 Encounter for screening for COVID-19: Secondary | ICD-10-CM

## 2022-02-28 DIAGNOSIS — Z87891 Personal history of nicotine dependence: Secondary | ICD-10-CM

## 2022-02-28 DIAGNOSIS — Z993 Dependence on wheelchair: Secondary | ICD-10-CM

## 2022-02-28 DIAGNOSIS — E1122 Type 2 diabetes mellitus with diabetic chronic kidney disease: Secondary | ICD-10-CM | POA: Diagnosis not present

## 2022-02-28 DIAGNOSIS — G894 Chronic pain syndrome: Secondary | ICD-10-CM | POA: Diagnosis present

## 2022-02-28 DIAGNOSIS — K625 Hemorrhage of anus and rectum: Secondary | ICD-10-CM | POA: Diagnosis present

## 2022-02-28 DIAGNOSIS — F1410 Cocaine abuse, uncomplicated: Secondary | ICD-10-CM | POA: Diagnosis present

## 2022-02-28 DIAGNOSIS — Z8673 Personal history of transient ischemic attack (TIA), and cerebral infarction without residual deficits: Secondary | ICD-10-CM | POA: Diagnosis not present

## 2022-02-28 DIAGNOSIS — F32A Depression, unspecified: Secondary | ICD-10-CM | POA: Diagnosis not present

## 2022-02-28 DIAGNOSIS — K5909 Other constipation: Secondary | ICD-10-CM | POA: Diagnosis present

## 2022-02-28 DIAGNOSIS — D649 Anemia, unspecified: Secondary | ICD-10-CM | POA: Diagnosis present

## 2022-02-28 DIAGNOSIS — J439 Emphysema, unspecified: Secondary | ICD-10-CM | POA: Diagnosis not present

## 2022-02-28 DIAGNOSIS — Z638 Other specified problems related to primary support group: Secondary | ICD-10-CM

## 2022-02-28 DIAGNOSIS — Z6837 Body mass index (BMI) 37.0-37.9, adult: Secondary | ICD-10-CM

## 2022-02-28 DIAGNOSIS — Z7951 Long term (current) use of inhaled steroids: Secondary | ICD-10-CM

## 2022-02-28 DIAGNOSIS — K5791 Diverticulosis of intestine, part unspecified, without perforation or abscess with bleeding: Secondary | ICD-10-CM | POA: Diagnosis not present

## 2022-02-28 DIAGNOSIS — Z818 Family history of other mental and behavioral disorders: Secondary | ICD-10-CM

## 2022-02-28 DIAGNOSIS — K922 Gastrointestinal hemorrhage, unspecified: Secondary | ICD-10-CM | POA: Diagnosis not present

## 2022-02-28 DIAGNOSIS — G4733 Obstructive sleep apnea (adult) (pediatric): Secondary | ICD-10-CM | POA: Diagnosis present

## 2022-02-28 DIAGNOSIS — I509 Heart failure, unspecified: Principal | ICD-10-CM

## 2022-02-28 DIAGNOSIS — R5381 Other malaise: Secondary | ICD-10-CM | POA: Diagnosis present

## 2022-02-28 DIAGNOSIS — I130 Hypertensive heart and chronic kidney disease with heart failure and stage 1 through stage 4 chronic kidney disease, or unspecified chronic kidney disease: Secondary | ICD-10-CM | POA: Diagnosis not present

## 2022-02-28 DIAGNOSIS — R911 Solitary pulmonary nodule: Secondary | ICD-10-CM | POA: Diagnosis present

## 2022-02-28 DIAGNOSIS — R509 Fever, unspecified: Secondary | ICD-10-CM | POA: Diagnosis not present

## 2022-02-28 DIAGNOSIS — R0600 Dyspnea, unspecified: Secondary | ICD-10-CM | POA: Diagnosis not present

## 2022-02-28 DIAGNOSIS — R0603 Acute respiratory distress: Secondary | ICD-10-CM | POA: Diagnosis not present

## 2022-02-28 DIAGNOSIS — J449 Chronic obstructive pulmonary disease, unspecified: Secondary | ICD-10-CM | POA: Diagnosis not present

## 2022-02-28 DIAGNOSIS — M109 Gout, unspecified: Secondary | ICD-10-CM | POA: Diagnosis present

## 2022-02-28 DIAGNOSIS — I10 Essential (primary) hypertension: Secondary | ICD-10-CM | POA: Diagnosis present

## 2022-02-28 DIAGNOSIS — E785 Hyperlipidemia, unspecified: Secondary | ICD-10-CM | POA: Diagnosis not present

## 2022-02-28 DIAGNOSIS — E1140 Type 2 diabetes mellitus with diabetic neuropathy, unspecified: Secondary | ICD-10-CM | POA: Diagnosis not present

## 2022-02-28 DIAGNOSIS — Z888 Allergy status to other drugs, medicaments and biological substances status: Secondary | ICD-10-CM

## 2022-02-28 DIAGNOSIS — I252 Old myocardial infarction: Secondary | ICD-10-CM

## 2022-02-28 DIAGNOSIS — M47814 Spondylosis without myelopathy or radiculopathy, thoracic region: Secondary | ICD-10-CM | POA: Diagnosis not present

## 2022-02-28 DIAGNOSIS — Z8674 Personal history of sudden cardiac arrest: Secondary | ICD-10-CM

## 2022-02-28 DIAGNOSIS — F250 Schizoaffective disorder, bipolar type: Secondary | ICD-10-CM | POA: Diagnosis present

## 2022-02-28 DIAGNOSIS — D500 Iron deficiency anemia secondary to blood loss (chronic): Secondary | ICD-10-CM | POA: Diagnosis present

## 2022-02-28 DIAGNOSIS — N184 Chronic kidney disease, stage 4 (severe): Secondary | ICD-10-CM | POA: Diagnosis not present

## 2022-02-28 DIAGNOSIS — N179 Acute kidney failure, unspecified: Secondary | ICD-10-CM | POA: Diagnosis present

## 2022-02-28 DIAGNOSIS — Z90710 Acquired absence of both cervix and uterus: Secondary | ICD-10-CM

## 2022-02-28 DIAGNOSIS — N189 Chronic kidney disease, unspecified: Secondary | ICD-10-CM | POA: Diagnosis present

## 2022-02-28 DIAGNOSIS — I110 Hypertensive heart disease with heart failure: Secondary | ICD-10-CM | POA: Diagnosis not present

## 2022-02-28 DIAGNOSIS — D72829 Elevated white blood cell count, unspecified: Secondary | ICD-10-CM | POA: Diagnosis not present

## 2022-02-28 DIAGNOSIS — D631 Anemia in chronic kidney disease: Secondary | ICD-10-CM | POA: Diagnosis present

## 2022-02-28 DIAGNOSIS — E669 Obesity, unspecified: Secondary | ICD-10-CM | POA: Diagnosis present

## 2022-02-28 DIAGNOSIS — M542 Cervicalgia: Secondary | ICD-10-CM

## 2022-02-28 DIAGNOSIS — Z743 Need for continuous supervision: Secondary | ICD-10-CM | POA: Diagnosis not present

## 2022-02-28 DIAGNOSIS — R609 Edema, unspecified: Secondary | ICD-10-CM | POA: Diagnosis not present

## 2022-02-28 DIAGNOSIS — Z452 Encounter for adjustment and management of vascular access device: Secondary | ICD-10-CM | POA: Diagnosis not present

## 2022-02-28 DIAGNOSIS — I5033 Acute on chronic diastolic (congestive) heart failure: Secondary | ICD-10-CM | POA: Diagnosis not present

## 2022-02-28 DIAGNOSIS — I2510 Atherosclerotic heart disease of native coronary artery without angina pectoris: Secondary | ICD-10-CM | POA: Diagnosis present

## 2022-02-28 DIAGNOSIS — F259 Schizoaffective disorder, unspecified: Secondary | ICD-10-CM | POA: Diagnosis present

## 2022-02-28 DIAGNOSIS — I517 Cardiomegaly: Secondary | ICD-10-CM | POA: Diagnosis not present

## 2022-02-28 DIAGNOSIS — R9431 Abnormal electrocardiogram [ECG] [EKG]: Secondary | ICD-10-CM

## 2022-02-28 DIAGNOSIS — K5731 Diverticulosis of large intestine without perforation or abscess with bleeding: Secondary | ICD-10-CM | POA: Diagnosis not present

## 2022-02-28 DIAGNOSIS — Z8249 Family history of ischemic heart disease and other diseases of the circulatory system: Secondary | ICD-10-CM

## 2022-02-28 DIAGNOSIS — Z8042 Family history of malignant neoplasm of prostate: Secondary | ICD-10-CM

## 2022-02-28 DIAGNOSIS — Z882 Allergy status to sulfonamides status: Secondary | ICD-10-CM

## 2022-02-28 DIAGNOSIS — Z885 Allergy status to narcotic agent status: Secondary | ICD-10-CM

## 2022-02-28 DIAGNOSIS — Z79899 Other long term (current) drug therapy: Secondary | ICD-10-CM

## 2022-02-28 DIAGNOSIS — K219 Gastro-esophageal reflux disease without esophagitis: Secondary | ICD-10-CM | POA: Diagnosis present

## 2022-02-28 LAB — CBC WITH DIFFERENTIAL/PLATELET
Abs Immature Granulocytes: 0.07 10*3/uL (ref 0.00–0.07)
Basophils Absolute: 0 10*3/uL (ref 0.0–0.1)
Basophils Relative: 0 %
Eosinophils Absolute: 0.2 10*3/uL (ref 0.0–0.5)
Eosinophils Relative: 2 %
HCT: 29.5 % — ABNORMAL LOW (ref 36.0–46.0)
Hemoglobin: 9.2 g/dL — ABNORMAL LOW (ref 12.0–15.0)
Immature Granulocytes: 1 %
Lymphocytes Relative: 14 %
Lymphs Abs: 1.6 10*3/uL (ref 0.7–4.0)
MCH: 28.6 pg (ref 26.0–34.0)
MCHC: 31.2 g/dL (ref 30.0–36.0)
MCV: 91.6 fL (ref 80.0–100.0)
Monocytes Absolute: 0.8 10*3/uL (ref 0.1–1.0)
Monocytes Relative: 7 %
Neutro Abs: 8.6 10*3/uL — ABNORMAL HIGH (ref 1.7–7.7)
Neutrophils Relative %: 76 %
Platelets: 294 10*3/uL (ref 150–400)
RBC: 3.22 MIL/uL — ABNORMAL LOW (ref 3.87–5.11)
RDW: 16 % — ABNORMAL HIGH (ref 11.5–15.5)
WBC: 11.3 10*3/uL — ABNORMAL HIGH (ref 4.0–10.5)
nRBC: 0 % (ref 0.0–0.2)

## 2022-02-28 LAB — RAPID URINE DRUG SCREEN, HOSP PERFORMED
Amphetamines: NOT DETECTED
Barbiturates: NOT DETECTED
Benzodiazepines: NOT DETECTED
Cocaine: POSITIVE — AB
Opiates: NOT DETECTED
Tetrahydrocannabinol: NOT DETECTED

## 2022-02-28 LAB — COMPREHENSIVE METABOLIC PANEL
ALT: 10 U/L (ref 0–44)
AST: 18 U/L (ref 15–41)
Albumin: 2.7 g/dL — ABNORMAL LOW (ref 3.5–5.0)
Alkaline Phosphatase: 121 U/L (ref 38–126)
Anion gap: 15 (ref 5–15)
BUN: 68 mg/dL — ABNORMAL HIGH (ref 8–23)
CO2: 20 mmol/L — ABNORMAL LOW (ref 22–32)
Calcium: 9.2 mg/dL (ref 8.9–10.3)
Chloride: 108 mmol/L (ref 98–111)
Creatinine, Ser: 3.36 mg/dL — ABNORMAL HIGH (ref 0.44–1.00)
GFR, Estimated: 15 mL/min — ABNORMAL LOW (ref >60)
Glucose, Bld: 82 mg/dL (ref 70–99)
Potassium: 4.4 mmol/L (ref 3.5–5.1)
Sodium: 143 mmol/L (ref 135–145)
Total Bilirubin: 0.2 mg/dL — ABNORMAL LOW (ref 0.3–1.2)
Total Protein: 6.4 g/dL — ABNORMAL LOW (ref 6.5–8.1)

## 2022-02-28 LAB — BASIC METABOLIC PANEL
Anion gap: 10 (ref 5–15)
Anion gap: 10 (ref 5–15)
BUN: 68 mg/dL — ABNORMAL HIGH (ref 8–23)
BUN: 65 mg/dL — ABNORMAL HIGH (ref 8–23)
CO2: 24 mmol/L (ref 22–32)
CO2: 24 mmol/L (ref 22–32)
Calcium: 8.6 mg/dL — ABNORMAL LOW (ref 8.9–10.3)
Calcium: 8.8 mg/dL — ABNORMAL LOW (ref 8.9–10.3)
Chloride: 109 mmol/L (ref 98–111)
Chloride: 110 mmol/L (ref 98–111)
Creatinine, Ser: 3.27 mg/dL — ABNORMAL HIGH (ref 0.44–1.00)
Creatinine, Ser: 3.14 mg/dL — ABNORMAL HIGH (ref 0.44–1.00)
GFR, Estimated: 15 mL/min — ABNORMAL LOW (ref >60)
GFR, Estimated: 16 mL/min — ABNORMAL LOW (ref >60)
Glucose, Bld: 99 mg/dL (ref 70–99)
Glucose, Bld: 89 mg/dL (ref 70–99)
Potassium: 3.8 mmol/L (ref 3.5–5.1)
Potassium: 4.1 mmol/L (ref 3.5–5.1)
Sodium: 143 mmol/L (ref 135–145)
Sodium: 144 mmol/L (ref 135–145)

## 2022-02-28 LAB — TROPONIN I (HIGH SENSITIVITY)
Troponin I (High Sensitivity): 16 ng/L (ref <18)
Troponin I (High Sensitivity): 15 ng/L (ref <18)

## 2022-02-28 LAB — BRAIN NATRIURETIC PEPTIDE: B Natriuretic Peptide: 542.7 pg/mL — ABNORMAL HIGH (ref 0.0–100.0)

## 2022-02-28 LAB — MAGNESIUM
Magnesium: 2 mg/dL (ref 1.7–2.4)
Magnesium: 1.8 mg/dL (ref 1.7–2.4)

## 2022-02-28 LAB — RESP PANEL BY RT-PCR (RSV, FLU A&B, COVID)  RVPGX2
Influenza A by PCR: NEGATIVE
Influenza B by PCR: NEGATIVE
Resp Syncytial Virus by PCR: NEGATIVE
SARS Coronavirus 2 by RT PCR: NEGATIVE

## 2022-02-28 LAB — TSH: TSH: 0.57 u[IU]/mL (ref 0.350–4.500)

## 2022-02-28 MED ORDER — UMECLIDINIUM BROMIDE 62.5 MCG/ACT IN AEPB
1.0000 | INHALATION_SPRAY | Freq: Every day | RESPIRATORY_TRACT | Status: DC
  Administered 2022-02-28 – 2022-03-05 (×5): 1 via RESPIRATORY_TRACT
  Filled 2022-02-28 (×2): qty 7

## 2022-02-28 MED ORDER — SODIUM CHLORIDE 0.9% FLUSH
3.0000 mL | Freq: Two times a day (BID) | INTRAVENOUS | Status: DC
  Administered 2022-02-28 – 2022-03-06 (×11): 3 mL via INTRAVENOUS

## 2022-02-28 MED ORDER — ASENAPINE MALEATE 5 MG SL SUBL
5.0000 mg | SUBLINGUAL_TABLET | Freq: Two times a day (BID) | SUBLINGUAL | Status: DC
  Administered 2022-02-28 – 2022-03-06 (×13): 5 mg via SUBLINGUAL
  Filled 2022-02-28 (×15): qty 1

## 2022-02-28 MED ORDER — ACETAMINOPHEN 325 MG PO TABS
650.0000 mg | ORAL_TABLET | ORAL | Status: DC | PRN
  Administered 2022-02-28 – 2022-03-06 (×12): 650 mg via ORAL
  Filled 2022-02-28 (×12): qty 2

## 2022-02-28 MED ORDER — DULOXETINE HCL 30 MG PO CPEP
30.0000 mg | ORAL_CAPSULE | Freq: Every day | ORAL | Status: DC
  Administered 2022-02-28 – 2022-03-06 (×7): 30 mg via ORAL
  Filled 2022-02-28 (×7): qty 1

## 2022-02-28 MED ORDER — SODIUM CHLORIDE 0.9 % IV SOLN: 250.0000 mL | INTRAVENOUS | Status: DC | PRN

## 2022-02-28 MED ORDER — IPRATROPIUM-ALBUTEROL 0.5-2.5 (3) MG/3ML IN SOLN
3.0000 mL | Freq: Once | RESPIRATORY_TRACT | Status: AC
  Administered 2022-02-28: 3 mL via RESPIRATORY_TRACT
  Filled 2022-02-28: qty 3

## 2022-02-28 MED ORDER — FLUTICASONE FUROATE-VILANTEROL 100-25 MCG/ACT IN AEPB
1.0000 | INHALATION_SPRAY | Freq: Every day | RESPIRATORY_TRACT | Status: DC
  Administered 2022-02-28 – 2022-03-05 (×5): 1 via RESPIRATORY_TRACT
  Filled 2022-02-28 (×2): qty 28

## 2022-02-28 MED ORDER — ENOXAPARIN SODIUM 30 MG/0.3ML IJ SOSY
30.0000 mg | PREFILLED_SYRINGE | INTRAMUSCULAR | Status: DC
  Administered 2022-02-28 – 2022-03-02 (×3): 30 mg via SUBCUTANEOUS
  Filled 2022-02-28 (×3): qty 0.3

## 2022-02-28 MED ORDER — LIDOCAINE 5 % EX PTCH
1.0000 | MEDICATED_PATCH | CUTANEOUS | Status: DC
  Administered 2022-02-28 – 2022-03-06 (×7): 1 via TRANSDERMAL
  Filled 2022-02-28 (×7): qty 1

## 2022-02-28 MED ORDER — ESCITALOPRAM OXALATE 10 MG PO TABS
20.0000 mg | ORAL_TABLET | Freq: Every day | ORAL | Status: DC
  Administered 2022-02-28 – 2022-03-06 (×7): 20 mg via ORAL
  Filled 2022-02-28 (×7): qty 2

## 2022-02-28 MED ORDER — PRAZOSIN HCL 2 MG PO CAPS
2.0000 mg | ORAL_CAPSULE | Freq: Every day | ORAL | Status: DC
  Administered 2022-02-28 – 2022-03-05 (×6): 2 mg via ORAL
  Filled 2022-02-28 (×7): qty 1

## 2022-02-28 MED ORDER — FUROSEMIDE 10 MG/ML IJ SOLN
80.0000 mg | Freq: Once | INTRAMUSCULAR | Status: AC
  Administered 2022-02-28: 80 mg via INTRAVENOUS
  Filled 2022-02-28: qty 8

## 2022-02-28 MED ORDER — POTASSIUM CHLORIDE CRYS ER 20 MEQ PO TBCR
20.0000 meq | EXTENDED_RELEASE_TABLET | Freq: Once | ORAL | Status: AC
  Administered 2022-02-28: 20 meq via ORAL
  Filled 2022-02-28: qty 1

## 2022-02-28 MED ORDER — ATORVASTATIN CALCIUM 10 MG PO TABS
20.0000 mg | ORAL_TABLET | Freq: Every day | ORAL | Status: DC
  Administered 2022-02-28 – 2022-03-06 (×7): 20 mg via ORAL
  Filled 2022-02-28 (×7): qty 2

## 2022-02-28 MED ORDER — SODIUM CHLORIDE 0.9% FLUSH: 3.0000 mL | INTRAVENOUS | Status: DC | PRN

## 2022-02-28 MED ORDER — CARVEDILOL 6.25 MG PO TABS
6.2500 mg | ORAL_TABLET | Freq: Two times a day (BID) | ORAL | Status: DC
  Administered 2022-02-28 – 2022-03-06 (×12): 6.25 mg via ORAL
  Filled 2022-02-28: qty 2
  Filled 2022-02-28 (×8): qty 1
  Filled 2022-02-28: qty 2
  Filled 2022-02-28 (×2): qty 1

## 2022-02-28 MED ORDER — PREGABALIN 100 MG PO CAPS
100.0000 mg | ORAL_CAPSULE | Freq: Two times a day (BID) | ORAL | Status: DC
  Administered 2022-02-28 – 2022-03-01 (×4): 100 mg via ORAL
  Filled 2022-02-28 (×4): qty 1

## 2022-02-28 MED ORDER — OLANZAPINE 5 MG PO TABS
5.0000 mg | ORAL_TABLET | Freq: Every day | ORAL | Status: DC
  Administered 2022-02-28 – 2022-03-06 (×7): 5 mg via ORAL
  Filled 2022-02-28 (×7): qty 1
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
## 2022-02-28 NOTE — Assessment & Plan Note (Addendum)
Stable.  - Cont Hydralazine 31m q8h and Coreg 6.215mBID - Continue prazosin QHS

## 2022-02-28 NOTE — Assessment & Plan Note (Addendum)
-   Continue home Saphris, Olanzapine - Continue Lexapro and Cymbalta - Would favor continuing her home prazosin as well given the two-fold benefit she is getting for both her mental health and her BP, knowing that this drug can have some degree of harm in HF---I suspect that poorly controlled schizophrenia would prove even more deleterious.  - UDS positive for cocaine

## 2022-02-28 NOTE — ED Provider Notes (Signed)
[facility] Provider Note   CSN: [id number] Arrival date & time: 02/28/22  0259     History  Chief Complaint  Patient presents with   Shortness of Breath   Abdominal Pain    Pt went to doc- 40 pounds of excess fluid on her. Not a dialysis pt    Tiffany [name] is a 62 y.o. female.  62 year old female with past medical history significant for CHF presents today for evaluation of generalized bodyaches.  She states she had a visit with her primary care provider earlier today and was recommended to come into the emergency department for significant volume overload however she deferred.  She states due to the body aches she had to come in for evaluation and inpatient management.  She states over the past week she has had worsening peripheral edema, her chronic 3 pillow orthopnea increased to 6 pillow orthopnea.  Has conversational dyspnea.  Currently denies chest pain, however she states she had some chest pain earlier today.  The history is provided by the patient. No language interpreter was used.       Home Medications Prior to Admission medications   Medication Sig Start Date End Date Taking? Authorizing Provider  albuterol (VENTOLIN HFA) 108 (90 Base) MCG/ACT inhaler Inhale 2 puffs into the lungs every 6 (six) hours as needed for wheezing or shortness of breath. 01/05/22   Leslie Dales, DO  atorvastatin (LIPITOR) 20 MG tablet Take 1 tablet (20 mg total) by mouth daily. 06/24/21   [name], MD  carvedilol (COREG) 6.25 MG tablet Take 1 tablet (6.25 mg total) by mouth 2 (two) times daily with a meal. 02/07/22 03/09/22  Lilland, Alana, DO  dicyclomine (BENTYL) 10 MG capsule Take 1 capsule (10 mg total) by mouth 3 (three) times daily before meals. 01/05/22   Leslie Dales, DO  DULoxetine (CYMBALTA) 30 MG capsule Take 30 mg by mouth daily. 11/22/21   [provider]  escitalopram (LEXAPRO) 20 MG tablet Take 20 mg by mouth daily.     [provider]  febuxostat (ULORIC) 40 MG tablet Take 1 tablet (40 mg total) by mouth daily. Patient not taking: Reported on 02/24/2022 01/05/22   Leslie Dales, DO  Fluticasone-Umeclidin-Vilant (TRELEGY ELLIPTA) 100-62.5-25 MCG/ACT AEPB Inhale 1 puff into the lungs daily. 01/05/22   Leslie Dales, DO  loperamide (IMODIUM) 2 MG capsule Take 2 capsules (4 mg total) by mouth 3 (three) times daily as needed for diarrhea or loose stools. 02/27/22   Zenia Resides, MD  OLANZapine (ZYPREXA) 5 MG tablet Take 1 tablet (5 mg total) by mouth daily. 02/27/22   Zenia Resides, MD  omeprazole (PRILOSEC) 40 MG capsule Take 1 capsule (40 mg total) by mouth every morning. Patient taking differently: Take 40 mg by mouth in the morning and at bedtime. 06/24/21   [name], MD  potassium chloride SA (KLOR-CON M) 20 MEQ tablet Take 40 mEq by mouth 2 (two) times daily.    [provider]  prazosin (MINIPRESS) 2 MG capsule TAKE 1 CAPSULE(2 MG) BY MOUTH AT BEDTIME 02/27/22   Zenia Resides, MD  pregabalin (LYRICA) 100 MG capsule Take 1 capsule (100 mg total) by mouth 2 (two) times daily. 02/27/22   Zenia Resides, MD  SAPHRIS 5 MG SUBL 24 hr tablet Place 5 mg under the tongue 2 (two) times daily. 01/27/22   [provider]  torsemide (DEMADEX) 20 MG tablet Take 3 tablets (60 mg  total) by mouth daily. 02/27/22   Zenia Resides, MD      Allergies    Hydrocodone, Anette Guarneri [lurasidone hcl], Magnesium-containing compounds, Prednisone, Tramadol, Codeine, Trazodone, Peanut-containing drug products, Topamax [topiramate], Sulfa antibiotics, and Tape    Review of Systems   Review of Systems  Constitutional:  Negative for chills and fever.  Respiratory:  Positive for shortness of breath.   Cardiovascular:  Positive for leg swelling. Negative for chest pain.  Gastrointestinal:  Negative for abdominal distention, abdominal pain, nausea and vomiting.  Genitourinary:  Negative for  dysuria.  Neurological:  Negative for light-headedness.  All other systems reviewed and are negative.   Physical Exam Updated Vital Signs BP (!) 166/82 (BP Location: Right Arm)   Pulse 69   Temp 98.1 F (36.7 C)   Resp 16   Ht 5' 4"$  (1.626 m)   Wt 99.2 kg   SpO2 98%   BMI 37.54 kg/m  Physical Exam Vitals and nursing note reviewed.  Constitutional:      General: [name] not in acute distress.    Appearance: Normal appearance. [name] obese. [name] not ill-appearing.  HENT:     Head: Normocephalic and atraumatic.     Nose: Nose normal.  Eyes:     General: No scleral icterus.    Extraocular Movements: Extraocular movements intact.     Conjunctiva/sclera: Conjunctivae normal.  Cardiovascular:     Rate and Rhythm: Normal rate and regular rhythm.     Pulses: Normal pulses.  Pulmonary:     Effort: Respiratory distress (Mild distress with conversational dyspnea.  No significant respiratory distress.) present.     Breath sounds: No wheezing.  Abdominal:     General: There is no distension.     Palpations: Abdomen is soft.     Tenderness: There is no abdominal tenderness. There is no guarding.  Musculoskeletal:        General: Normal range of motion.     Cervical back: Normal range of motion.     Right lower leg: Edema present.     Left lower leg: Edema present.  Skin:    General: Skin is warm and dry.  Neurological:     General: No focal deficit present.     Mental Status: [name] alert. Mental status is at baseline.     ED Results / Procedures / Treatments   Labs (all labs ordered are listed, but only abnormal results are displayed) Labs Reviewed  RESP PANEL BY RT-PCR (RSV, FLU A&B, COVID)  RVPGX2  CBC WITH DIFFERENTIAL/PLATELET  COMPREHENSIVE METABOLIC PANEL  MAGNESIUM  BRAIN NATRIURETIC PEPTIDE  TROPONIN I (HIGH SENSITIVITY)    EKG None  Radiology No results found.  Procedures Procedures    Medications Ordered in ED Medications - No data to  display  ED Course/ Medical Decision Making/ A&P                             Medical Decision Making Amount and/or Complexity of Data Reviewed Labs: ordered. Radiology: ordered.  Risk Prescription drug management.   Medical Decision Making / ED Course   This patient presents to the ED for concern of shortness of breath, body aches, this involves an extensive number of treatment options, and is a complaint that carries with it a high risk of complications and morbidity.  The differential diagnosis includes electrolyte imbalance, CHF exacerbation, pneumonia, viral URI, ACS  MDM: 62 year old female presents today  for evaluation of above-mentioned complaints.  Does appear to have mild respiratory distress with conversational dyspnea, significant peripheral edema.  Reports worsening orthopnea from baseline.  Currently has 6 pillow orthopnea.  Reports significant dyspnea on exertion.  Satting about 100% on room air.  Was seen at family practice yesterday and it was deemed she was likely 40 pounds overweight from a fluid standpoint.  She was recommended to come into the hospital at that time for aggressive diuresis however she turned this down.  She got home and developed body aches and thought that it was best if she came in for inpatient treatment.  Chest x-ray without acute cardiopulmonary process.  EKG without acute ischemic changes.  CBC with mild leukocytosis without significant left shift.  Hemoglobin at patient's baseline.  COVID, flu, RSV negative.  Magnesium 2.0.  CMP shows worsening renal insufficiency with creatinine 3.36 up from about 2.7.  Likely renal congestion.  Initial troponin of 16. BNP elevated to 542.  Discussed with family practice who will evaluate patient for admission.  Will order Lasix 80 mg.   Lab Tests: -I ordered, reviewed, and interpreted labs.   The pertinent results include:   Labs Reviewed  CBC WITH DIFFERENTIAL/PLATELET - Abnormal; Notable for the following  components:      Result Value   WBC 11.3 (*)    RBC 3.22 (*)    Hemoglobin 9.2 (*)    HCT 29.5 (*)    RDW 16.0 (*)    Neutro Abs 8.6 (*)    All other components within normal limits  COMPREHENSIVE METABOLIC PANEL - Abnormal; Notable for the following components:   CO2 20 (*)    BUN 68 (*)    Creatinine, Ser 3.36 (*)    Total Protein 6.4 (*)    Albumin 2.7 (*)    Total Bilirubin 0.2 (*)    GFR, Estimated 15 (*)    All other components within normal limits  BRAIN NATRIURETIC PEPTIDE - Abnormal; Notable for the following components:   B Natriuretic Peptide 542.7 (*)    All other components within normal limits  RESP PANEL BY RT-PCR (RSV, FLU A&B, COVID)  RVPGX2  MAGNESIUM  TROPONIN I (HIGH SENSITIVITY)  TROPONIN I (HIGH SENSITIVITY)      EKG  EKG Interpretation  Date/Time:    Ventricular Rate:    PR Interval:    QRS Duration:   QT Interval:    QTC Calculation:   R Axis:     Text Interpretation:           Imaging Studies ordered: I ordered imaging studies including CXR  I independently visualized and interpreted imaging. I agree with the radiologist interpretation   Medicines ordered and prescription drug management: Meds ordered this encounter  Medications   furosemide (LASIX) injection 80 mg    -I have reviewed the patients home medicines and have made adjustments as needed   Reevaluation: After the interventions noted above, I reevaluated the patient and found that they have :stayed the same  Co morbidities that complicate the patient evaluation  Past Medical History:  Diagnosis Date   Agitation 11/22/2017   Anoxic brain injury ([place]) 09/08/2016   C. Arrest due to respiratory failure and COPD exacerbation   Anxiety    Arthritis    "all over" (04/10/2016)   Asthma 10/18/2010   Binge eating disorder    Cardiac arrest ([place]) 09/08/2016   PEA   Carotid artery stenosis    1-39% bilateral by dopplers 11/2016  Chronic diastolic (congestive) heart  failure (HCC)    Chronic kidney disease, stage 3 (HCC)    Chronic pain syndrome 06/18/2012   Chronic post-traumatic stress disorder (PTSD) 05/27/2018   Chronic respiratory failure with hypoxia and hypercapnia (HCC) 06/22/2015   TRILOGY Vent >AVAPA-ES., Vt target 200-400, Max P 30 , PS max 20 , PS min 6-10 , E Max 6, E Min 4, Rate Auto AVAPS Rate 2 (titrate for pt comfort) , bleed O2 at 5l/m continuous flow .    CKD (chronic kidney disease) 02/03/2022   Closed displaced fracture of fifth metacarpal bone 03/21/2018   Cocaine use disorder, severe, in sustained remission ([place]) [id number]   Complication of anesthesia    decreased bp, decreased heart rate   COPD (chronic obstructive pulmonary disease) ([place]) 07/08/2014   Depression    Diabetic neuropathy ([place]) 04/24/2011   Difficulty with speech 01/24/2018   Disorder of nervous system    Drug abuse ([place]) 11/21/2017   Dyslipidemia 04/24/2011   Elevated troponin 04/28/2012   Emphysema    Encephalopathy 11/21/2017   Essential hypertension 03/22/2016   Fibula fracture 07/10/2016   Frequent falls 10/11/2017   GERD (gastroesophageal reflux disease)    Gout 04/11/2017   Heart attack ([place]) 1980s   History of blood transfusion 1994   "couldn't stop bleeding from my period"   History of drug abuse in remission ([place]) 11/28/2015   Quit in 2017   Hyperlipidemia LDL goal <70    Incontinence    Manic depression ([place])    Morbid obesity ([place]) 10/18/2010   Obstructive sleep apnea 10/18/2010   On home oxygen therapy    "6L; 24/7" (04/10/2016)   OSA on CPAP    "wear mask sometimes" (04/10/2016)   Paranoid ([place])    "sometimes; I'm on RX for it" (04/10/2016)   Prolonged Q-T interval on ECG    Rectal bleeding 12/31/2015   Schizoaffective disorder, bipolar type ([place]) 04/05/2018   Seasonal allergies    Seborrheic keratoses 12/31/2013   Seizures ([place])    "don't know what kind; last one was ~ 1 yr ago" (04/10/2016)   Sinus bradycardia    Stroke [facility])  1980s   denies residual on 04/10/2016   [place]rush 09/19/2013   Type 2 diabetes mellitus ([place]) 10/18/2010      Dispostion: Patient discussed with the practice will evaluate patient for admission.  Final Clinical Impression(s) / ED Diagnoses Final diagnoses:  Acute on chronic congestive heart failure, unspecified heart failure type [facility])    Rx / DC Orders ED Discharge Orders     None         Evlyn Courier, PA-C 02/28/22 [id number]    Merryl Hacker, MD 02/28/22 [phone]

## 2022-02-28 NOTE — ED Notes (Signed)
ED TO INPATIENT HANDOFF REPORT  ED Nurse Name and Phone #: [phone]  S Name/Age/Gender Tiffany [name] 62 y.o. female Room/Bed: 039C/039C  Code Status   Code Status: Full Code  Home/SNF/Other Home Patient oriented to: self, place, time, and situation Is this baseline?  Right now pt is very sleepy, but arousable  Triage Complete: Triage complete  Chief Complaint CHF exacerbation ([place]) [I50.9] Acute exacerbation of CHF (congestive heart failure) ([place]) [I50.9]  Triage Note Pt c/o all over body pains. Pt went to her doctor today and doc told her she has 40 pounds of excess fliud on her. Swollen legs- no pitting edema no weeping. Pain 10/10   Allergies Allergies  Allergen Reactions   Hydrocodone Shortness Of Breath   Latuda [Lurasidone Hcl] Anaphylaxis   Magnesium-Containing Compounds Anaphylaxis and Other (See Comments)    Tolerated Ensure   Prednisone Anaphylaxis, Swelling and Other (See Comments)    Tongue swelling, lip swelling, throat swelling, per pt    Tramadol Anaphylaxis, Swelling and Rash   Codeine Nausea And Vomiting and Rash   Trazodone Other (See Comments)    paranoia   Peanut-Containing Drug Products Hives    Raw peanuts, but can eat peanut butter   Topamax [Topiramate] Other (See Comments)    Increases paranoia   Sulfa Antibiotics Itching   Tape Rash    Level of Care/Admitting Diagnosis ED Disposition     ED Disposition  Admit   Condition  --   Comment  Hospital Area: [facility] [[id number]]  Level of Care: Telemetry Medical [104]  May place patient in observation at [facility] or [place] if equivalent level of care is available:: No  Covid Evaluation: Asymptomatic - no recent exposure (last 10 days) testing not required  Diagnosis: Acute exacerbation of CHF (congestive heart failure) [facility]) [id number]  Admitting Physician: Eppie Gibson [id number]  Attending Physician: Lenoria Chime [id number]           B Medical/Surgery History Past Medical History:  Diagnosis Date   Agitation 11/22/2017   Anoxic brain injury ([place]) 09/08/2016   C. Arrest due to respiratory failure and COPD exacerbation   Anxiety    Arthritis    "all over" (04/10/2016)   Asthma 10/18/2010   Binge eating disorder    Cardiac arrest ([place]) 09/08/2016   PEA   Carotid artery stenosis    1-39% bilateral by dopplers 11/2016   Chronic diastolic (congestive) heart failure (HCC)    Chronic kidney disease, stage 3 (HCC)    Chronic pain syndrome 06/18/2012   Chronic post-traumatic stress disorder (PTSD) 05/27/2018   Chronic respiratory failure with hypoxia and hypercapnia ([place]) 06/22/2015   TRILOGY Vent >AVAPA-ES., Vt target 200-400, Max P 30 , PS max 20 , PS min 6-10 , E Max 6, E Min 4, Rate Auto AVAPS Rate 2 (titrate for pt comfort) , bleed O2 at 5l/m continuous flow .    CKD (chronic kidney disease) 02/03/2022   Closed displaced fracture of fifth metacarpal bone 03/21/2018   Cocaine use disorder, severe, in sustained remission ([place]) [id number]   Complication of anesthesia    decreased bp, decreased heart rate   COPD (chronic obstructive pulmonary disease) ([place]) 07/08/2014   Depression    Diabetic neuropathy ([place]) 04/24/2011   Difficulty with speech 01/24/2018   Disorder of nervous system    Drug abuse ([place]) 11/21/2017   Dyslipidemia 04/24/2011   Elevated troponin 04/28/2012   Emphysema    Encephalopathy 11/21/2017  Essential hypertension 03/22/2016   Fibula fracture 07/10/2016   Frequent falls 10/11/2017   GERD (gastroesophageal reflux disease)    Gout 04/11/2017   Heart attack ([place]) 1980s   History of blood transfusion 1994   "couldn't stop bleeding from my period"   History of drug abuse in remission ([place]) 11/28/2015   Quit in 2017   Hyperlipidemia LDL goal <70    Incontinence    Manic depression ([place])    Morbid obesity ([place]) 10/18/2010   Obstructive sleep apnea 10/18/2010   On home oxygen therapy     "6L; 24/7" (04/10/2016)   OSA on CPAP    "wear mask sometimes" (04/10/2016)   Paranoid ([place])    "sometimes; I'm on RX for it" (04/10/2016)   Prolonged Q-T interval on ECG    Rectal bleeding 12/31/2015   Schizoaffective disorder, bipolar type ([place]) 04/05/2018   Seasonal allergies    Seborrheic keratoses 12/31/2013   Seizures ([place])    "don't know what kind; last one was ~ 1 yr ago" (04/10/2016)   Sinus bradycardia    Stroke [facility]) 1980s   denies residual on 04/10/2016   Thrush 09/19/2013   Type 2 diabetes mellitus ([place]) 10/18/2010   Past Surgical History:  Procedure Laterality Date   CESAREAN SECTION  1997   COLONOSCOPY WITH PROPOFOL N/A 04/01/2021   Procedure: COLONOSCOPY WITH PROPOFOL;  Surgeon: Carol Ada, MD;  Location: [place] [facility];  Service: Gastroenterology;  Laterality: N/A;  Rectal bleeding with drop in hemoglobin to 7.2 g/dL   HERNIA REPAIR     IR CHOLANGIOGRAM EXISTING TUBE  07/20/2016   IR PERC CHOLECYSTOSTOMY  05/10/2016   IR RADIOLOGIST EVAL & MGMT  06/08/2016   IR RADIOLOGIST EVAL & MGMT  06/29/2016   IR SINUS/FIST TUBE CHK-NON GI  07/12/2016   RIGHT/LEFT HEART CATH AND CORONARY ANGIOGRAPHY N/A 06/19/2017   Procedure: RIGHT/LEFT HEART CATH AND CORONARY ANGIOGRAPHY;  Surgeon: Jolaine Artist, MD;  Location: [facility];  Service: Cardiovascular;  Laterality: N/A;   TIBIA IM NAIL INSERTION Right 07/12/2016   Procedure: INTRAMEDULLARY (IM) NAIL RIGHT TIBIA;  Surgeon: Leandrew Koyanagi, MD;  Location: [facility];  Service: Orthopedics;  Laterality: Right;   UMBILICAL HERNIA REPAIR  ~ 1963   "that's why I don't have a belly button"   VAGINAL HYSTERECTOMY       A IV Location/Drains/Wounds Patient Lines/Drains/Airways Status     Active Line/Drains/Airways     Name Placement date Placement time Site Days   Peripheral IV 02/06/22 Posterior;Right Forearm 02/06/22  0550  Forearm  22   Peripheral IV 02/28/22 20 G 2.5" Anterior;Left Forearm 02/28/22  0649  Forearm  less than 1    Pressure Injury Sacrum Mid Stage 2 -  Partial thickness loss of dermis presenting as a shallow open injury with a red, pink wound bed without slough. --  --  -- --   Wound / Incision (Open or Dehisced) 05/11/20 Heel Left open skin, pink and no drainage 05/11/20  --  Heel  658            Intake/Output Last 24 hours  Intake/Output Summary (Last 24 hours) at 02/28/2022 1718 Last data filed at 02/28/2022 1339 Gross per 24 hour  Intake --  Output 2200 ml  Net -2200 ml    Labs/Imaging Results for orders placed or performed during the hospital encounter of 02/28/22 (from the past 48 hour(s))  Resp panel by RT-PCR (RSV, Flu A&B, Covid) Anterior Nasal Swab  Status: None   Collection Time: 02/28/22  4:22 AM   Specimen: Anterior Nasal Swab  Result Value Ref Range   SARS Coronavirus 2 by RT PCR NEGATIVE NEGATIVE   Influenza A by PCR NEGATIVE NEGATIVE   Influenza B by PCR NEGATIVE NEGATIVE    Comment: (NOTE) The Xpert Xpress SARS-CoV-2/FLU/RSV plus assay is intended as an aid in the diagnosis of influenza from Nasopharyngeal swab specimens and should not be used as a sole basis for treatment. Nasal washings and aspirates are unacceptable for Xpert Xpress SARS-CoV-2/FLU/RSV testing.  Fact Sheet for Patients: EntrepreneurPulse.com.au  Fact Sheet for Healthcare Providers: IncredibleEmployment.be  This test is not yet approved or cleared by the [place] FDA and has been authorized for detection and/or diagnosis of SARS-CoV-2 by FDA under an Emergency Use Authorization (EUA). This EUA will remain in effect (meaning this test can be used) for the duration of the COVID-19 declaration under Section 564(b)(1) of the Act, 21 U.S.C. section 360bbb-3(b)(1), unless the authorization is terminated or revoked.     Resp Syncytial Virus by PCR NEGATIVE NEGATIVE    Comment: (NOTE) Fact Sheet for  Patients: EntrepreneurPulse.com.au  Fact Sheet for Healthcare Providers: IncredibleEmployment.be  This test is not yet approved or cleared by the [place] FDA and has been authorized for detection and/or diagnosis of SARS-CoV-2 by FDA under an Emergency Use Authorization (EUA). This EUA will remain in effect (meaning this test can be used) for the duration of the COVID-19 declaration under Section 564(b)(1) of the Act, 21 U.S.C. section 360bbb-3(b)(1), unless the authorization is terminated or revoked.  Performed at [facility], [place] [address], [place], [place] [address]   CBC with Differential     Status: Abnormal   Collection Time: 02/28/22  4:37 AM  Result Value Ref Range   WBC 11.3 (H) 4.0 - 10.5 K/uL   RBC 3.22 (L) 3.87 - 5.11 MIL/uL   Hemoglobin 9.2 (L) 12.0 - 15.0 g/dL   HCT 29.5 (L) 36.0 - 46.0 %   MCV 91.6 80.0 - 100.0 fL   MCH 28.6 26.0 - 34.0 pg   MCHC 31.2 30.0 - 36.0 g/dL   RDW 16.0 (H) 11.5 - 15.5 %   Platelets 294 150 - 400 K/uL   nRBC 0.0 0.0 - 0.2 %   Neutrophils Relative % 76 %   Neutro Abs 8.6 (H) 1.7 - 7.7 K/uL   Lymphocytes Relative 14 %   Lymphs Abs 1.6 0.7 - 4.0 K/uL   Monocytes Relative 7 %   Monocytes Absolute 0.8 0.1 - 1.0 K/uL   Eosinophils Relative 2 %   Eosinophils Absolute 0.2 0.0 - 0.5 K/uL   Basophils Relative 0 %   Basophils Absolute 0.0 0.0 - 0.1 K/uL   Immature Granulocytes 1 %   Abs Immature Granulocytes 0.07 0.00 - 0.07 K/uL    Comment: Performed at [facility], [place] [address], [place], [place] [address]  Comprehensive metabolic panel     Status: Abnormal   Collection Time: 02/28/22  4:37 AM  Result Value Ref Range   Sodium 143 135 - 145 mmol/L   Potassium 4.4 3.5 - 5.1 mmol/L   Chloride 108 98 - 111 mmol/L   CO2 20 (L) 22 - 32 mmol/L   Glucose, Bld 82 70 - 99 mg/dL    Comment: Glucose reference range applies only to samples taken after fasting for at least 8 hours.   BUN 68  (H) 8 - 23 mg/dL  Creatinine, Ser 3.36 (H) 0.44 - 1.00 mg/dL   Calcium 9.2 8.9 - 10.3 mg/dL   Total Protein 6.4 (L) 6.5 - 8.1 g/dL   Albumin 2.7 (L) 3.5 - 5.0 g/dL   AST 18 15 - 41 U/L   ALT 10 0 - 44 U/L   Alkaline Phosphatase 121 38 - 126 U/L   Total Bilirubin 0.2 (L) 0.3 - 1.2 mg/dL   GFR, Estimated 15 (L) >60 mL/min    Comment: (NOTE) Calculated using the CKD-EPI Creatinine Equation (2021)    Anion gap 15 5 - 15    Comment: Performed at [facility] [address], [place], [place] [address]  Magnesium     Status: None   Collection Time: 02/28/22  4:37 AM  Result Value Ref Range   Magnesium 2.0 1.7 - 2.4 mg/dL    Comment: Performed at [facility] [address] [place], [place] [address]  Brain natriuretic peptide     Status: Abnormal   Collection Time: 02/28/22  4:37 AM  Result Value Ref Range   B Natriuretic Peptide 542.7 (H) 0.0 - 100.0 pg/mL    Comment: Performed at [facility] [address] [place], [place] [address]  Troponin I (High Sensitivity)     Status: None   Collection Time: 02/28/22  4:37 AM  Result Value Ref Range   Troponin I (High Sensitivity) 16 <18 ng/L    Comment: (NOTE) Elevated high sensitivity troponin I (hsTnI) values and significant  changes across serial measurements may suggest ACS but many other  chronic and acute conditions are known to elevate hsTnI results.  Refer to the "Links" section for chest pain algorithms and additional  guidance. Performed at [facility], [place] [address], [place], [place] [address]   Troponin I (High Sensitivity)     Status: None   Collection Time: 02/28/22  5:56 AM  Result Value Ref Range   Troponin I (High Sensitivity) 15 <18 ng/L    Comment: (NOTE) Elevated high sensitivity troponin I (hsTnI) values and significant  changes across serial measurements may suggest ACS but many other  chronic and acute conditions are known to elevate hsTnI results.  Refer to the "Links" section  for chest pain algorithms and additional  guidance. Performed at [facility], [place] [address] [place],  [address]   Rapid urine drug screen (hospital performed)     Status: Abnormal   Collection Time: 02/28/22  9:51 AM  Result Value Ref Range   Opiates NONE DETECTED NONE DETECTED   Cocaine POSITIVE (A) NONE DETECTED   Benzodiazepines NONE DETECTED NONE DETECTED   Amphetamines NONE DETECTED NONE DETECTED   Tetrahydrocannabinol NONE DETECTED NONE DETECTED   Barbiturates NONE DETECTED NONE DETECTED    Comment: (NOTE) DRUG SCREEN FOR MEDICAL PURPOSES ONLY.  IF CONFIRMATION IS NEEDED FOR ANY PURPOSE, NOTIFY LAB WITHIN 5 DAYS.  LOWEST DETECTABLE LIMITS FOR URINE DRUG SCREEN Drug Class                     Cutoff (ng/mL) Amphetamine and metabolites    1000 Barbiturate and metabolites    200 Benzodiazepine                 200 Opiates and metabolites        300 Cocaine and metabolites        300 THC  50 Performed at [facility], [place] [address] [place], [place] [id number]   Basic metabolic panel     Status: Abnormal   Collection Time: 02/28/22  1:28 PM  Result Value Ref Range   Sodium 143 135 - 145 mmol/L   Potassium 3.8 3.5 - 5.1 mmol/L   Chloride 109 98 - 111 mmol/L   CO2 24 22 - 32 mmol/L   Glucose, Bld 99 70 - 99 mg/dL    Comment: Glucose reference range applies only to samples taken after fasting for at least 8 hours.   BUN 68 (H) 8 - 23 mg/dL   Creatinine, Ser 3.27 (H) 0.44 - 1.00 mg/dL   Calcium 8.6 (L) 8.9 - 10.3 mg/dL   GFR, Estimated 15 (L) >60 mL/min    Comment: (NOTE) Calculated using the CKD-EPI Creatinine Equation (2021)    Anion gap 10 5 - 15    Comment: Performed at [facility] [address], [place], [place] [address]  Magnesium     Status: None   Collection Time: 02/28/22  1:28 PM  Result Value Ref Range   Magnesium 1.8 1.7 - 2.4 mg/dL    Comment: Performed at [facility] [address]  [place], [place] [address]  TSH     Status: None   Collection Time: 02/28/22  1:47 PM  Result Value Ref Range   TSH 0.570 0.350 - 4.500 uIU/mL    Comment: Performed by a 3rd Generation assay with a functional sensitivity of <=0.01 uIU/mL. Performed at [facility], [place] [address], [place], [place] [address]    *Note: Due to a large number of results and/or encounters for the requested time period, some results have not been displayed. A complete set of results can be found in Results Review.   DG Chest Portable 1 View  Result Date: 02/28/2022 CLINICAL DATA:  Dyspnea. EXAM: PORTABLE CHEST 1 VIEW COMPARISON:  02/03/2022. FINDINGS: The heart is enlarged and the mediastinal contour is within normal limits. There is atherosclerotic calcification of the aorta. No consolidation, effusion, or pneumothorax. Old healed rib fractures are present on the left. IMPRESSION: Cardiomegaly with no active disease. Electronically Signed   By: Brett Fairy M.D.   On: 02/28/2022 03:58    Pending Labs Unresulted Labs (From admission, onward)     Start     Ordered   03/01/22 [id number]  Basic metabolic panel  Daily,   R     Comments: As Scheduled for 5 days    02/28/22 0808   03/01/22 0500  Magnesium  Tomorrow morning,   R        02/28/22 0808   03/01/22 0500  CBC  Tomorrow morning,   R        02/28/22 0919   02/28/22 [id number]  Basic metabolic panel  Once,   R        02/28/22 1606            Vitals/Pain Today's Vitals   02/28/22 0957 02/28/22 1005 02/28/22 1300 02/28/22 1714  BP:  (!) 168/86 130/80 (!) 142/79  Pulse:  68 62 60  Resp:  17 14 12  $ Temp: 97.7 F (36.5 C)   98.8 F (37.1 C)  TempSrc: Oral     SpO2:  100% 99% 98%  Weight:      Height:      PainSc:    0-No pain    Isolation Precautions Airborne and Contact precautions  Medications  Medications  atorvastatin (LIPITOR) tablet 20 mg (20 mg Oral Given 02/28/22 0958)  carvedilol (COREG) tablet 6.25 mg (6.25 mg Oral Given 02/28/22 1716)   prazosin (MINIPRESS) capsule 2 mg (has no administration in time range)  OLANZapine (ZYPREXA) tablet 5 mg (5 mg Oral Given 02/28/22 0958)  asenapine (SAPHRIS) sublingual tablet 5 mg (5 mg Sublingual Given 02/28/22 0958)  escitalopram (LEXAPRO) tablet 20 mg (20 mg Oral Given 02/28/22 1005)  DULoxetine (CYMBALTA) DR capsule 30 mg (30 mg Oral Given 02/28/22 1004)  pregabalin (LYRICA) capsule 100 mg (100 mg Oral Given 02/28/22 1004)  fluticasone furoate-vilanterol (BREO ELLIPTA) 100-25 MCG/ACT 1 puff (1 puff Inhalation Given 02/28/22 1000)    And  umeclidinium bromide (INCRUSE ELLIPTA) 62.5 MCG/ACT 1 puff (1 puff Inhalation Given 02/28/22 1000)  sodium chloride flush (NS) 0.9 % injection 3 mL (3 mLs Intravenous Given 02/28/22 1005)  sodium chloride flush (NS) 0.9 % injection 3 mL (has no administration in time range)  0.9 %  sodium chloride infusion (has no administration in time range)  acetaminophen (TYLENOL) tablet 650 mg (has no administration in time range)  enoxaparin (LOVENOX) injection 30 mg (30 mg Subcutaneous Given 02/28/22 1005)  lidocaine (LIDODERM) 5 % 1 patch (1 patch Transdermal Patch Applied 02/28/22 0959)  furosemide (LASIX) injection 80 mg (80 mg Intravenous Given 02/28/22 0649)  potassium chloride SA (KLOR-CON M) CR tablet 20 mEq (20 mEq Oral Given 02/28/22 0957)  ipratropium-albuterol (DUONEB) 0.5-2.5 (3) MG/3ML nebulizer solution 3 mL (3 mLs Nebulization Given 02/28/22 0959)    Mobility Pt said she normally does not walk     Focused Assessments Pulmonary Assessment Handoff:  Lung sounds: Bilateral Breath Sounds: Diminished L Breath Sounds: Diminished R Breath Sounds: Diminished O2 Device: Nasal Cannula O2 Flow Rate (L/min): 2 L/min    R Recommendations: See Admitting Provider Note  Report given to:   Additional Notes: not sure what her baseline is with ambulation. [name] sleepy, but is arousable and answer question. Please read chart and call if you have questions.

## 2022-02-28 NOTE — Progress Notes (Signed)
Heart Failure Navigator Progress Note  Assessed for Heart & Vascular TOC clinic readiness.  Patient with a EF 60-65%. Has a [facility] follow up appointment scheduled for 03/07/2022. .   Navigator available for reassessment of patient.   Tiffany [name], BSN, Clinical cytogeneticist Only

## 2022-02-28 NOTE — Progress Notes (Signed)
PT Cancellation Note  Patient Details Name: Tiffany [name] MRN: [id number] DOB: 1961-01-10   Cancelled Treatment:    Reason Eval/Treat Not Completed: Other (comment). Pt remains in emergency department. Per last admission in January 24 the pt was performing lateral scoot transfers. Due to height of ED stretchers lateral scoot transfers would not be feasible or safe to attempt at this time. PT will follow up when pt is transferred to inpatient room. IF not transferred to room overnight PT will follow up in the ED and mobilize as effectively and safely as possible.   Zenaida Niece 02/28/2022, 3:53 PM

## 2022-02-28 NOTE — Assessment & Plan Note (Signed)
Difficult ECG--poor tracing. Electronic read of A fib, but there does appear to be a P-wave before every QRS and rhythm appears regular. However, ??Non-conducted p-waves between QRS complexes. - Will repeat EKG and try to get a better tracing

## 2022-02-28 NOTE — Hospital Course (Addendum)
Tiffany [name] is a 62 y.o.female with a history of HFpEF, CKD, T2DM, COPD, HTN, Schizophrenia, H/o cocaine use who was admitted to the [facility] Medicine Teaching Service at [facility] for dyspnea. Her hospital course is detailed below:  Dyspnea  Acute on Chronic Heart Failure  Patient presented to PCP on 2/12 for dyspnea. She was found to be very fluid overloaded at the clinic. On admission patient had elevated BNP to 542. She was stable on room air. CXR showed cardiomegaly without evidence of pleural effusions or pulmonary congestion, but patient was 40 lbs above her dry weight. Patient was treated with IV lasix. Patient diuresed well and was back to baseline on 03/02/22. Patient not a good candidate for SGLT2 given her poor renal function.  Cardiology consulted and recommended discharge with lasix held given GI bleeds. She has an appointment with Cardiology on 2/20 and could be restarted at that time if needed.  Physical deconditioning Patient is wheel chair bound. PT/OT recommended SNF- but patient declined. Ordered HHPT.  AKI  Patient has CKD4 at baseline, but increased creatinine from baseline on admission that was most likely prerenal due to intravascular depletion in the setting of volume overload. AKI improved with diuresis.  COPD  Patient on room air at admission. Equivalent of trelegy continued during admission. Albuterol nebs were offered prn.   GI bleed For subsided by still on 2/14, and hemoglobin was stable.  On 2/15 primary team was paged that patient had significant blood clots-hemoglobin down to 7.1.  Patient was consented for blood transfusion, and received 1 PRBC-hemoglobin increased to 8.5. Aspirin was stopped.  GI was consulted who said they believed her bleed was unlikely upper GI bleed. Patient had colonoscopy in 2018 with significant diverticulosis which they believe this bleed is consistent with. Overall patient received 2 u pRBC and hemoglobin was stable at 8.7 on day of discharge.    Other chronic conditions were medically managed with home medications and formulary alternatives as necessary (lexapro, cymbalta, saphris, olanzapine, aspirin, statin, coreg)  PCP Follow-up Recommendations: 1.) Held lasix on discharge. Cardiology appointment on 2/20  2.) Rec outpt follow-up with pulm or cards for pulmonary htn  3.) Needs nephro follow-up  4.) CT with focal opacity on RUL, recommendation for 3 mo imaging follow up  6.) Pregabalin was changed to renal dosing, recommend review of medications for renal dosing. 6.)  Needs psychiatrist outpatient

## 2022-02-28 NOTE — Assessment & Plan Note (Addendum)
-   Lidocaine patch  - Heating pad prn - cont Cymbalta

## 2022-02-28 NOTE — Progress Notes (Addendum)
Transition of Care [facility]) - Emergency Department Mini Assessment   Patient Details  Name: Tiffany [name] MRN: [id number] Date of Birth: 29-Oct-1960  Transition of Care [facility]) CM/SW Contact:    Fuller Mandril, RN Phone Number: 02/28/2022, 8:20 AM   Clinical Narrative: Observation for CHF exacerbation. Living arrangements - the patient lives alone. Home health services with [facility].     ED Mini Assessment:    Barriers to Discharge: Continued Medical Work up     Means of departure: Not know       Patient Contact and Communications        ,          Patient states their goals for this hospitalization and ongoing recovery are:: return home      Admission diagnosis:  CHF exacerbation ([place]) [I50.9] Acute exacerbation of CHF (congestive heart failure) ([place]) [I50.9] Patient Active Problem List   Diagnosis Date Noted   CHF exacerbation ([place]) 02/28/2022   Acute exacerbation of CHF (congestive heart failure) ([place]) 02/28/2022   Pneumonia 02/06/2022   Acute on chronic diastolic heart failure ([place]) 02/04/2022   Dyspnea 02/03/2022   Encounter to establish care 01/05/2022   Malingering 10/19/2021   Recurrent syncope 07/09/2021   Chronic leg pain 07/09/2021   Dehydration 07/09/2021   Delusional disorder, persecutory type ([place]) 06/26/2021   Rhabdomyolysis 06/16/2021   Normocytic anemia 06/16/2021   Anxiety and depression 05/25/2021   Physical deconditioning 05/25/2021   Cauda equina compression ([place]) 04/24/2021   Hematochezia    Hyperlipidemia 03/31/2021   GI bleed 03/30/2021   Acute GI bleeding 03/29/2021   Anemia in other chronic diseases classified elsewhere 01/16/2021   Coronary artery disease involving native coronary artery of native heart without angina pectoris 01/16/2021   Anxiety disorder, unspecified 01/16/2021   Atypical chest pain 01/13/2021   GERD (gastroesophageal reflux disease)    Neuropathy 11/18/2020   Pain in both feet 11/18/2020   Bilateral  leg edema 11/18/2020   Polypharmacy 11/17/2020   Protein-calorie malnutrition, severe 05/15/2020   Malnutrition of moderate degree [id number]   Periumbilical abdominal pain    Dysphagia    Nausea vomiting and diarrhea    Abdominal pain    E. coli UTI [id number]   Acute metabolic encephalopathy A999333   Schizophrenia ([place]) 02/20/2020   Schizophrenia, schizo-affective ([place]) [id number]   Metabolic acidosis, normal anion gap (NAG) 02/20/2020   AMS (altered mental status)    Hypocalcemia    AKI (acute kidney injury) ([place]) 02/18/2020   Pelvic mass in female 01/02/2020   Genital herpes 11/25/2019   Stage 2 skin ulcer of sacral region ([place]) 10/28/2019   Acute renal failure superimposed on stage 3b chronic kidney disease ([place]) 10/27/2019   Family discord 02/04/2019   Aggressive behavior    PTSD (post-traumatic stress disorder) 05/27/2018   Schizoaffective disorder, bipolar type ([place]) 04/05/2018   Closed displaced fracture of fifth metacarpal bone 03/21/2018   Difficulty with speech 01/24/2018   Encephalopathy 11/21/2017   Drug abuse ([place]) 11/21/2017   Frequent falls 10/11/2017   Binge eating disorder    Dependence on continuous supplemental oxygen 05/14/2017   Gout 04/11/2017   Chronic diastolic congestive heart failure ([place]) 04/02/2017   Hypomagnesemia    Chronic kidney disease (CKD), stage IV (severe) ([place]) 12/15/2016   Carotid artery stenosis    Hypokalemia    Osteoarthritis 10/26/2016   Anoxic brain injury ([place]) 09/08/2016   Overactive bladder 06/07/2016   OSA and COPD overlap syndrome ([place])    Arthritis  Essential hypertension 03/22/2016   Cocaine use disorder, severe, in sustained remission ([place]) 12/17/2015   History of drug abuse in remission ([place]) 11/28/2015   Hyponatremia 11/25/2015   Chronic respiratory failure with hypoxia ([place]) 06/22/2015   Tobacco use disorder 07/22/2014   COPD (chronic obstructive pulmonary disease) ([place]) 07/08/2014   Seizure ([place])  01/04/2013   Chronic low back pain with sciatica 06/18/2012   Dyslipidemia 04/24/2011   Anemia 04/24/2011   Diabetic neuropathy ([place]) 04/24/2011   Morbid obesity ([place]) 10/18/2010   Type 2 diabetes mellitus ([place]) 10/18/2010   PCP:  Leslie Dales, DO Pharmacy:   [facility] [place], [place] [address] [place] [address] Phone: [phone] Fax: [phone]  [name] [address] [address] Phone: [phone] Fax: [phone]

## 2022-02-28 NOTE — Assessment & Plan Note (Deleted)
Stable and chronic. Noted to have some blood in stool overnight- suspect hemorrhoid. Recheck H&H. - AM CBC

## 2022-02-28 NOTE — Assessment & Plan Note (Signed)
On RA. - Cont formulary equivalent of home Trelegy - albuterol prn - Outpatient f/u of lung nodule

## 2022-02-28 NOTE — Assessment & Plan Note (Signed)
Suspect 2/2 underdiuresis at home. Also possible her worsening renal function is playing a role here. Sounds like this has been progressive over a number of weeks, no particular inciting event. Recent echo <1 mo ago with intact EF 60-65%, mild LVH, Grade II diastolic dysfunction. Elevated RV Systolic pressure to [id number] mmHg. BNP to 542.7, was also elevated to 411 during last admission. Will need to closely monitor renal function during diuresis.  - Admit to med-tele - Telemetry for acute CHF exacerbation - S/p Lasix 31m x1 in the ED - Will redose Lasix pending output and renal function tolerance - Maintain K>4, Mag >2 during diuresis - Strict I/O - Daily weights - Heart healthy, salt-restricted diet - May benefit from SGLT2i in the future pending improvement in renal function - Will have PT/OT evaluate

## 2022-02-28 NOTE — Assessment & Plan Note (Addendum)
Improved today 2.74, recent baseline is ~2.70 - Can restart po lasix now that she has had no bleeding recently and Cr is improved.  - Trend on am BMP - Needs outpatient renal follow-up

## 2022-02-28 NOTE — Assessment & Plan Note (Addendum)
PT/OT recommending SNF, patient has refused SNF to multiple providers. There is concern over safe discharge as patient is unable to ambulate from bed to chair without assistance. HHPT was ordered.  - PT/OT

## 2022-02-28 NOTE — Assessment & Plan Note (Addendum)
~  8.8  L UOP since admission. 91.6 kg today, down from 99.2kg on admission. Diuresing well w/ Lasix 80 mg PO. Cr improving w/ diuresis. On RA. Per cardiology, she will need close follow-up after d/c to reassess diuresis plan.  - Cont lasix 80 mg PO BID - Maintain K>4, Mag >2 during diuresis - Strict I/O - Daily weights

## 2022-02-28 NOTE — Assessment & Plan Note (Signed)
Resolved and afebrile. Low c/f infection at this time.

## 2022-02-28 NOTE — Assessment & Plan Note (Addendum)
Unfortunately does not seem she will be able to tolerate ASA given bleeding in 03/2021 and again this admission with initiation of aspirin. Thankfully does not have any stents, on ASA for pure secondary prevention.  - Continue Coreg - Continue Statin

## 2022-02-28 NOTE — ED Triage Notes (Signed)
Pt c/o all over body pains. Pt went to her doctor today and doc told her she has 40 pounds of excess fliud on her. Swollen legs- no pitting edema no weeping. Pain 10/10

## 2022-03-01 DIAGNOSIS — I5033 Acute on chronic diastolic (congestive) heart failure: Secondary | ICD-10-CM | POA: Diagnosis not present

## 2022-03-01 LAB — CBC
HCT: 25.6 % — ABNORMAL LOW (ref 36.0–46.0)
Hemoglobin: 8.1 g/dL — ABNORMAL LOW (ref 12.0–15.0)
MCH: 28 pg (ref 26.0–34.0)
MCHC: 31.6 g/dL (ref 30.0–36.0)
MCV: 88.6 fL (ref 80.0–100.0)
Platelets: 295 10*3/uL (ref 150–400)
RBC: 2.89 MIL/uL — ABNORMAL LOW (ref 3.87–5.11)
RDW: 15.7 % — ABNORMAL HIGH (ref 11.5–15.5)
WBC: 8.1 10*3/uL (ref 4.0–10.5)
nRBC: 0 % (ref 0.0–0.2)

## 2022-03-01 LAB — BASIC METABOLIC PANEL
Anion gap: 11 (ref 5–15)
BUN: 65 mg/dL — ABNORMAL HIGH (ref 8–23)
CO2: 24 mmol/L (ref 22–32)
Calcium: 8.7 mg/dL — ABNORMAL LOW (ref 8.9–10.3)
Chloride: 106 mmol/L (ref 98–111)
Creatinine, Ser: 3.08 mg/dL — ABNORMAL HIGH (ref 0.44–1.00)
GFR, Estimated: 17 mL/min — ABNORMAL LOW (ref >60)
Glucose, Bld: 91 mg/dL (ref 70–99)
Potassium: 4.1 mmol/L (ref 3.5–5.1)
Sodium: 141 mmol/L (ref 135–145)

## 2022-03-01 LAB — MAGNESIUM: Magnesium: 2 mg/dL (ref 1.7–2.4)

## 2022-03-01 MED ORDER — FUROSEMIDE 40 MG PO TABS
80.0000 mg | ORAL_TABLET | Freq: Two times a day (BID) | ORAL | Status: DC
  Administered 2022-03-01 – 2022-03-04 (×7): 80 mg via ORAL
  Filled 2022-03-01 (×7): qty 2

## 2022-03-01 MED ORDER — ASPIRIN 81 MG PO TBEC
81.0000 mg | DELAYED_RELEASE_TABLET | Freq: Every day | ORAL | Status: DC
  Administered 2022-03-01 – 2022-03-02 (×2): 81 mg via ORAL
  Filled 2022-03-01 (×2): qty 1

## 2022-03-01 MED ORDER — ASPIRIN 81 MG PO CHEW: 81.0000 mg | CHEWABLE_TABLET | Freq: Every day | ORAL | Status: DC

## 2022-03-01 NOTE — Evaluation (Signed)
Occupational Therapy Evaluation Patient Details Name: Tiffany [name] MRN: [id number] DOB: [birth year] Today's Date: 03/01/2022   History of Present Illness Tiffany [name] is a 62 y.o. female presenting with shortness of breath, leg swelling, admitted with CHF exacerbation. Has also had worsening renal function over the past few months. PMHx includes CAD s/p MI x2, CHF, HTN, HLD, CKD, schizophrenia/schizoaffective disorder, asthma/COPD, stroke, GERD, chronic pain syndrom, anoxic brain injury, PTSD, CVA and polysubstance use.   Clinical Impression   Pt admitted as above, presenting with deficits as listed below (refer to OT problem list). Pt lives alone in apartment with several STE, she requires EMS assistance to enter/leave house. Pt has HHA assist daily from 12 pm - 4 pm. Pt is otherwise home alone. Currently requires EMS assistance to enter house and calls EMS when she falls at home "to get up, because I can't do it". Pt is reporting 4-5+ falls in last 6 months at home. At baseline, she performs lateral scoot/sliding board transfers from couch to power w/c. Pt states that HHA does "everything " for her, question safety and cleanliness as she is currently +2 total assist for lateral scoot transfer from EOB to chair, pt does not bear weight. Pt also with generalized weaknees in UE's, reporting/demonstrating difficulty making a fist with hands. Despite this, pt reports that she bathes using wipes and wear/changes adult diapers on her own. Pt has had HHPT/OT in the past and prefers d/c home over SNF rehab. Pt is currently not safe to return home with 4 hours/day from [facility], therefore recommending SNF Rehab unless pt independence improves. Will follow acutely to assist in maximizing independence with ADL's and functional transfers related to ADL's as well as pt/family education.     Recommendations for follow up therapy are one component of a multi-disciplinary discharge planning process, led by the  attending physician.  Recommendations may be updated based on patient status, additional functional criteria and insurance authorization.   Follow Up Recommendations  Skilled nursing-short term rehab (<3 hours/day) (Pt currently refusing SNF, pt will OT/PT if refuses SNF)     Assistance Recommended at Discharge Frequent or constant Supervision/Assistance  Patient can return home with the following Two people to help with walking and/or transfers;A lot of help with walking and/or transfers;A lot of help with bathing/dressing/bathroom;Assist for transportation;Help with stairs or ramp for entrance;Assistance with cooking/housework;Two people to help with bathing/dressing/bathroom    Functional Status Assessment  Patient has had a recent decline in their functional status and demonstrates the ability to make significant improvements in function in a reasonable and predictable amount of time.  Equipment Recommendations  Other (comment) (To be determined)    Recommendations for Other Services       Precautions / Restrictions Precautions Precautions: Fall Precaution Comments: Non-ambulatory at baseline, performs sliding board/lateral scoot transfers at baseline. h/o 4-5+ falls in last 6 months requiring EMS to assist her in getting up      Mobility Bed Mobility Overal bed mobility: Needs Assistance Bed Mobility: Supine to Sit     Supine to sit: Modified independent (Device/Increase time)     General bed mobility comments: Pt able to come into long sitting at Mod I level, required +2 total assist to scoot to EOB however Patient Response: Flat affect  Transfers Overall transfer level: Needs assistance Equipment used: None Transfers: Bed to chair/wheelchair/BSC    Lateral/Scoot Transfers: Total assist, +2 physical assistance, +2 safety/equipment, From elevated surface General transfer comment: patient perfromed lateral scoot from  bed to chair with +2 total assist - sitting up in  chair at conclusion of therapy      Balance Overall balance assessment: History of Falls, Needs assistance Sitting-balance support: Feet supported Sitting balance-Leahy Scale: Fair Sitting balance - Comments: supervision for sitting balance at edge of bed and during transfer     Standing balance-Leahy Scale: Zero Standing balance comment: Pt was total assist +2 for lateral scoot transfer from EOB to chair (pt reports that she uses sliding board at home)       ADL either performed or assessed with clinical judgement   ADL Overall ADL's : Needs assistance/impaired Eating/Feeding: Set up   Grooming: Wash/dry hands;Wash/dry face;Set up;Sitting   Upper Body Bathing: Sitting;Set up;Minimal assistance   Lower Body Bathing: Total assistance;+2 for physical assistance;+2 for safety/equipment;Bed level   Upper Body Dressing : Minimal assistance;Set up;Sitting   Lower Body Dressing: Total assistance;+2 for physical assistance;+2 for safety/equipment;Bed level Lower Body Dressing Details (indicate cue type and reason): Pt reports that her Aide assists with all ADL's at baseline including socks/shoes. Total As to don socks at bed level. Toilet Transfer: Total assistance;+2 for physical assistance;+2 for safety/equipment;BSC/3in1 (Lateral scoot transfer to drop arm bariatric 3:1) Toilet Transfer Details (indicate cue type and reason): Performed lateral scoot transfer from EOB to chair total assist +2 Toileting- Clothing Manipulation and Hygiene: Total assistance;+2 for physical assistance;+2 for safety/equipment;Sitting/lateral lean;Bed level   Functional mobility during ADLs: Total assistance;+2 for physical assistance;+2 for safety/equipment;Cueing for sequencing;Cueing for safety (Total assist +2 for lateral scoot transfer from EOB to chair) General ADL Comments: Pt lives alone with HHA assist daily from 12 pm - 4 pm. Pt is otherwise home alone. Currently requires EMS assistance to enter  house and calls EMS when she falls at home "to get up, because I can't do it". Pt is reporting 4-5+ falls in last 6 months at home. At baseline, she performs lateral scoot/sliding board transfers from couch to power w/c. Pt states that HHA does "everything " for her, question safety and cleanliness as she is currently +2 total assist for lateral scoot transfer from EOB to chair, pt does not bear weight. Pt also with generalized weaknees in UE's, reporting/demonstrating difficulty making a fist with hands. Despite this, pt reports that she bathes using wipes and wear/changes adult diapers on her own. Pt has had HHPT/OT in the past and prefers d/c home over SNF rehab. Pt is currently not safe to return home with 4 hours/day from [facility], therefore recommending SNF Rehab unless pt independence improves.     Vision Baseline Vision/History: 1 Wears glasses Patient Visual Report: No change from baseline              Pertinent Vitals/Pain Pain Assessment Pain Assessment: Faces Faces Pain Scale: Hurts little more Pain Location: "All over" Pain Descriptors / Indicators: Aching, Discomfort Pain Intervention(s): Monitored during session, Repositioned, Limited activity within patient's tolerance     Hand Dominance Right   Extremity/Trunk Assessment Upper Extremity Assessment Upper Extremity Assessment: Generalized weakness (Bilateral UE weakness with reports of joint stiffness in bilateral hands when performing flexion/extension)   Lower Extremity Assessment Lower Extremity Assessment: Generalized weakness;Defer to PT evaluation       Communication Communication Communication: No difficulties   Cognition Arousal/Alertness: Awake/alert Behavior During Therapy: WFL for tasks assessed/performed Overall Cognitive Status: Within Functional Limits for tasks assessed       General Comments  Pt is adamant about not wanting to go to SNF Rehab at  this time, prefers home.            Home Living  Family/patient expects to be discharged to:: Private residence Living Arrangements: Alone Available Help at Discharge: Personal care attendant;Available PRN/intermittently Type of Home: Apartment Home Access: Stairs to enter Entrance Stairs-Number of Steps: 3 patient has EMS take her in and out if needed. Entrance Stairs-Rails: None Home Layout: One level     Bathroom Shower/Tub: Sponge bathes at baseline (Uses wipes)   Bathroom Toilet: Standard Bathroom Accessibility: Yes   Home Equipment: Rollator (4 wheels);BSC/3in1;Wheelchair - power;Wheelchair - manual   Additional Comments: Pt reports that she sleeps on the couch      Prior Functioning/Environment Prior Level of Function : Needs assist;History of Falls (last six months) (4-5 falls in last 6 months - EMS needs to assist with getting her back up)   Physical Assist : ADLs (physical)   ADLs (physical): IADLs;Dressing;Bathing Mobility Comments: Pt reports that she mobilizes herself from couch to power wheelchair and uses manual WC ad lib with LE's to keep up strength "when I'm feeling better". Pt reports 4-5 falls in last 6 months when transferring, requiring EMS to assist her in getting back up. ADLs Comments: Pt reports that her Aide helps her with "Everything". Aide assists with bathing and dressing, uses adult diapers. Pt bathes using wipes at baseline. Pt reports Aide provides all IADLs 4 hours a day x7 days a week.        OT Problem List: Pain;Cardiopulmonary status limiting activity;Decreased strength;Increased edema;Decreased activity tolerance;Decreased safety awareness;Impaired balance (sitting and/or standing);Decreased knowledge of use of DME or AE;Obesity      OT Treatment/Interventions: Self-care/ADL training;Therapeutic activities;Energy conservation;Patient/family education;DME and/or AE instruction;Balance training;Therapeutic exercise    OT Goals(Current goals can be found in the care plan section) Acute Rehab  OT Goals Patient Stated Goal: Go home OT Goal Formulation: With patient Time For Goal Achievement: 03/15/22 Potential to Achieve Goals: Fair  OT Frequency: Min 2X/week    Co-evaluation  Yes, for Pt/therapist safety    AM-PAC OT "6 Clicks" Daily Activity     Outcome Measure Help from another person eating meals?: A Little Help from another person taking care of personal grooming?: A Little Help from another person toileting, which includes using toliet, bedpan, or urinal?: Total Help from another person bathing (including washing, rinsing, drying)?: A Lot Help from another person to put on and taking off regular upper body clothing?: A Little Help from another person to put on and taking off regular lower body clothing?: Total 6 Click Score: 13   End of Session Nurse Communication: Mobility status;Other (comment) (OT/PT recommend SNF Rehab, pt adamant about home)  Activity Tolerance: Patient tolerated treatment well Patient left: in chair;with call bell/phone within reach;with chair alarm set  OT Visit Diagnosis: Other abnormalities of gait and mobility (R26.89);Repeated falls (R29.6);Muscle weakness (generalized) (M62.81);History of falling (Z91.81);Pain Pain - Right/Left:  (generalized - "all over")                Time: 1101-1134 OT Time Calculation (min): 33 min Charges:  OT General Charges $OT Visit: 1 Visit OT Evaluation $OT Eval Low Complexity: 1 Low  [place] Ent Beth [name], OTR/L 03/01/2022, 12:05 PM

## 2022-03-01 NOTE — Evaluation (Signed)
Physical Therapy Evaluation Patient Details Name: Tiffany [name] MRN: [id number] DOB: April 18, 1960 Today's Date: 03/01/2022  History of Present Illness  Tiffany [name] is a 62 y.o. female presenting with shortness of breath, leg swelling, admitted with CHF exacerbation. Has also had worsening renal function over the past few months. PMHx includes CAD s/p MI x2, CHF, HTN, HLD, CKD, schizophrenia/schizoaffective disorder, asthma/COPD, stroke, GERD, chronic pain syndrom, anoxic brain injury, PTSD, CVA and polysubstance use.  Clinical Impression  PTA pt living alone in apartment with steps to enter. Pt reports HHAide from 12-4pm 7 days a week, otherwise alone with minimal assistance from mother. Pt reports she sleeps on a couch, uses depends for toileting, and wipes for washing. Only able to use slide board from couch to wheelchair that she propels with her LE. Pt has had numerous falls and feels it is appropriate to use EMS to pick her up and to get her in and out of her apartment when she needs to go to doctor's appointments.   Pt limited in safe mobility by profound weakness in LE>UE, decreased safety awareness. Pt able to come to longsitting with min A, however is total Ax2 to get to the EoB and lateral scoot transfer to drop arm recliner. PT recommending SNF level rehab at discharge, although pt adamantly refuses. PT will continue to follow acutely.      Recommendations for follow up therapy are one component of a multi-disciplinary discharge planning process, led by the attending physician.  Recommendations may be updated based on patient status, additional functional criteria and insurance authorization.  Follow Up Recommendations Skilled nursing-short term rehab (<3 hours/day) Can patient physically be transported by private vehicle: No    Assistance Recommended at Discharge Frequent or constant Supervision/Assistance  Patient can return home with the following  Assist for transportation;Help  with stairs or ramp for entrance;Assistance with cooking/housework;Two people to help with walking and/or transfers;A lot of help with bathing/dressing/bathroom    Equipment Recommendations None recommended by PT        Precautions / Restrictions Precautions Precautions: Fall Precaution Comments: Non-ambulatory at baseline, performs sliding board/lateral scoot transfers at baseline. h/o 4-5+ falls in last 6 months requiring EMS to assist her in getting up Restrictions Weight Bearing Restrictions: No      Mobility  Bed Mobility Overal bed mobility: Needs Assistance Bed Mobility: Supine to Sit Rolling: Modified independent (Device/Increase time)   Supine to sit: Modified independent (Device/Increase time) Sit to supine: Modified independent (Device/Increase time)   General bed mobility comments: Pt able to come into long sitting at Mod I level, required +2 total assist to scoot to EOB however    Transfers Overall transfer level: Needs assistance Equipment used: None Transfers: Bed to chair/wheelchair/BSC            Lateral/Scoot Transfers: Total assist, +2 physical assistance, +2 safety/equipment, From elevated surface General transfer comment: patient perfromed lateral scoot from bed to chair with +2 total assist - sitting up in chair at conclusion of therapy    Ambulation/Gait               General Gait Details: not ambulatory at baseline, but states she is working towards being able to walk again with HHPT      Balance Overall balance assessment: History of Falls, Needs assistance Sitting-balance support: Feet supported Sitting balance-Leahy Scale: Fair Sitting balance - Comments: supervision for sitting balance at edge of bed and during transfer   Standing balance support: During functional activity,  Bilateral upper extremity supported Standing balance-Leahy Scale: Zero Standing balance comment: Pt was total assist +2 for lateral scoot transfer from EOB to  chair (pt reports that she uses sliding board at home)                             Pertinent Vitals/Pain Pain Assessment Pain Assessment: Faces Faces Pain Scale: Hurts little more Pain Location: "All over" Pain Descriptors / Indicators: Aching, Discomfort Pain Intervention(s): Monitored during session    Home Living Family/patient expects to be discharged to:: Private residence Living Arrangements: Alone Available Help at Discharge: Personal care attendant;Available PRN/intermittently Type of Home: Apartment Home Access: Stairs to enter Entrance Stairs-Rails: None Entrance Stairs-Number of Steps: 3 patient has EMS take her in and out if needed.   Home Layout: One level Home Equipment: Rollator (4 wheels);BSC/3in1;Wheelchair - power;Wheelchair - manual Additional Comments: Pt reports that she sleeps on the couch    Prior Function Prior Level of Function : Needs assist;History of Falls (last six months) (4-5 falls in last 6 months - EMS needs to assist with getting her back up)       Physical Assist : ADLs (physical)   ADLs (physical): IADLs;Dressing;Bathing Mobility Comments: Pt reports that she mobilizes herself from couch to power wheelchair and uses manual WC ad lib with LE's to keep up strength "when I'm feeling better". Pt reports 4-5 falls in last 6 months when transferring, requiring EMS to assist her in getting back up. ADLs Comments: Pt reports that her Aide helps her with "Everything". Aide assists with bathing and dressing, uses adult diapers. Pt bathes using wipes at baseline. Pt reports Aide provides all IADLs 4 hours a day x7 days a week.     Hand Dominance   Dominant Hand: Right    Extremity/Trunk Assessment   Upper Extremity Assessment Upper Extremity Assessment: Defer to OT evaluation    Lower Extremity Assessment Lower Extremity Assessment: RLE deficits/detail;LLE deficits/detail RLE Deficits / Details: Hips lack IR, hip flexion 2/5, knee  flex/ext 2/5, dorsiflexion 0/5, plantarflexion 2/5 RLE Sensation: decreased light touch (feet>lower leg) RLE Coordination: decreased fine motor;decreased gross motor LLE Deficits / Details: Hips lack IR, hip flexion 2/5, knee flex/ext 2/5, dorsiflexion 0/5, plantarflexion 2/5 LLE Sensation: decreased light touch (feet>lower leg) LLE Coordination: decreased fine motor;decreased gross motor    Cervical / Trunk Assessment Cervical / Trunk Assessment: Kyphotic  Communication   Communication: No difficulties  Cognition Arousal/Alertness: Awake/alert Behavior During Therapy: WFL for tasks assessed/performed Overall Cognitive Status: Within Functional Limits for tasks assessed                                 General Comments: No gross deficits. Oriented x all but situation to which pt was vague.        General Comments General comments (skin integrity, edema, etc.): Pt is adamant about not wanting to go to SNF Rehab at this time, prefers home.        Assessment/Plan    PT Assessment Patient needs continued PT services  PT Problem List Decreased strength;Decreased range of motion;Decreased activity tolerance;Decreased mobility;Decreased balance;Decreased coordination;Decreased cognition;Decreased safety awareness;Impaired sensation;Pain       PT Treatment Interventions Functional mobility training;Therapeutic activities;Therapeutic exercise;Balance training;Patient/family education;Cognitive remediation    PT Goals (Current goals can be found in the Care Plan section)  Acute Rehab PT Goals Patient Stated Goal: to  go home PT Goal Formulation: With patient Time For Goal Achievement: 02/15/22 Potential to Achieve Goals: Good    Frequency Min 3X/week        AM-PAC PT "6 Clicks" Mobility  Outcome Measure Help needed turning from your back to your side while in a flat bed without using bedrails?: A Lot Help needed moving from lying on your back to sitting on the  side of a flat bed without using bedrails?: A Lot Help needed moving to and from a bed to a chair (including a wheelchair)?: Total Help needed standing up from a chair using your arms (e.g., wheelchair or bedside chair)?: Total Help needed to walk in hospital room?: Total Help needed climbing 3-5 steps with a railing? : Total 6 Click Score: 8    End of Session Equipment Utilized During Treatment: Oxygen Activity Tolerance: Patient tolerated treatment well Patient left: in bed;with call bell/phone within reach;with nursing/sitter in room Nurse Communication: Mobility status PT Visit Diagnosis: Muscle weakness (generalized) (M62.81);Other abnormalities of gait and mobility (R26.89);Pain;History of falling (Z91.81);Repeated falls (R29.6);Difficulty in walking, not elsewhere classified (R26.2);Adult, failure to thrive (R62.7) Pain - Right/Left:  (B) Pain - part of body: Leg;Ankle and joints of foot    Time: 1101-1134 PT Time Calculation (min) (ACUTE ONLY): 33 min   Charges:   PT Evaluation $PT Eval Moderate Complexity: 1 Mod          Amyriah Buras B. Migdalia Dk PT, DPT Acute Rehabilitation Services Please use secure chat or  Call Office [phone]   [place] 03/01/2022, 12:22 PM

## 2022-03-01 NOTE — Progress Notes (Signed)
     Daily Progress Note Intern Pager: [phone]  Patient name: Tiffany [name] Medical record number: [id number] Date of birth: June 17, 1960 Age: 62 y.o. Gender: female  Primary Care Provider: Leslie Dales, DO Consultants: None Code Status: Full  Pt Overview and Major Events to Date:  2/13 Admitted  Assessment and Plan: Velora Mediate is a 62 yo F admitted for CHF exacerbation.  PMHx pertinent for HFpEF, CKD, T2DM, COPD, h/o cardiac arrest, HTN, schizophrenia, HLD, hx of cocaine use   * Acute on chronic diastolic heart failure ([place]) Had 4.6L UOP yesterday w/ Lasix 40 IV x2. Cr improving w/ diuresis. On RA. - Cont Lasix 40IV BID today. Reassess tomorrow - Maintain K>4, Mag >2 during diuresis - Strict I/O - Daily weights  AKI (acute kidney injury) ([place]) Cr downtrending w/ diuresis. - Diuresis as above  COPD (chronic obstructive pulmonary disease) (HCC) On RA. - Cont formulary equivalent of home Trelegy - albuterol prn - Outpatient f/u of lung nodule  Essential hypertension - Cont home prazosin and Coreg  Coronary artery disease involving native coronary artery of native heart without angina pectoris No noted contraindication to starting ASA. Plan to start ASA. - Continue Coreg - Continue Statin - Start ASA 81 daily  Schizophrenia, schizo-affective ([place]) - Continue home Saphris and Olanzapine - Continue Lexapro and Cymbalta - UDS positive for cocaine  Physical deconditioning - PT/OT   Leukocytosis-resolved as of 03/01/2022 Resolved and afebrile. Low c/f infection at this time.  Normocytic anemia Stable and chronic - AM CBC  Neck pain - Lidocaine patch  - Heating pad prn - cont Cymbalta    FEN/GI: Heart healthy, carb modified PPx: Lovenox Dispo:Pending PT recommendations  pending clinical improvement . Barriers include need for IV diuresis.   Subjective:  Reports that she continues to have neck pain and pain with deep breaths. Overall feels like her breathing  Is improving.  Objective: Temp:  [98.8 F (37.1 C)-99 F (37.2 C)] 99 F (37.2 C) (02/14 0517) Pulse Rate:  [59-62] 62 (02/14 1156) Resp:  [12-22] 16 (02/14 0517) BP: (130-149)/(74-88) 149/74 (02/14 0517) SpO2:  [94 %-99 %] 96 % (02/14 1156) Weight:  [94.1 kg] 94.1 kg (02/14 0500) Physical Exam: General: Alert, pleasant. Laying in bed. NAD. Cardiovascular: RRR.  Respiratory: CTAB. Normal WOB on RA.  Abdomen: Soft, nontender, nondistended.  Extremities: 1+ pitting edema BL LE.  Laboratory: Most recent CBC Lab Results  Component Value Date   WBC 8.1 03/01/2022   HGB 8.1 (L) 03/01/2022   HCT 25.6 (L) 03/01/2022   MCV 88.6 03/01/2022   PLT 295 03/01/2022   Most recent BMP    Latest Ref Rng & Units 03/01/2022   12:29 AM  BMP  Glucose 70 - 99 mg/dL 91   BUN 8 - 23 mg/dL 65   Creatinine 0.44 - 1.00 mg/dL 3.08   Sodium 135 - 145 mmol/L 141   Potassium 3.5 - 5.1 mmol/L 4.1   Chloride 98 - 111 mmol/L 106   CO2 22 - 32 mmol/L 24   Calcium 8.9 - 10.3 mg/dL 8.7     Arlyce Dice, MD 03/01/2022, 12:48 PM  PGY-1, [place] Intern pager: [phone], text pages welcome Secure chat group [place]

## 2022-03-01 NOTE — Progress Notes (Signed)
Mobility Specialist - Progress Note   03/01/22 1500  Mobility  Activity Transferred from chair to bed  Level of Assistance +2 (takes two people)  Assistive Device Other (Comment) (Drop arm recliner)  Distance Ambulated (ft) 0 ft  Activity Response Tolerated well  Mobility Referral Yes  $Mobility charge 1 Mobility    Pt received in recliner requesting to get back into bed. MaxA +2 required to lateral scoot pt from drop arm recliner back into bed. No complaints throughout. Left in bed w/ NT present at bed side.   [place] Specialist Please contact via SecureChat or Rehab office at [phone]

## 2022-03-01 NOTE — Care Management Obs Status (Signed)
[place] NOTIFICATION   Patient Details  Name: Tiffany [name] MRN: [id number] Date of Birth: January 07, 1961   Medicare Observation Status Notification Given:  Yes (Per Lynnea Ferrier RN) on 02/28/22    Carles Collet, RN 03/01/2022, 4:04 PM

## 2022-03-02 ENCOUNTER — Other Ambulatory Visit (HOSPITAL_COMMUNITY): Payer: Self-pay

## 2022-03-02 ENCOUNTER — Observation Stay (HOSPITAL_COMMUNITY): Payer: 59

## 2022-03-02 ENCOUNTER — Ambulatory Visit: Payer: 59 | Admitting: Family Medicine

## 2022-03-02 DIAGNOSIS — K625 Hemorrhage of anus and rectum: Secondary | ICD-10-CM | POA: Diagnosis not present

## 2022-03-02 DIAGNOSIS — I5033 Acute on chronic diastolic (congestive) heart failure: Secondary | ICD-10-CM | POA: Diagnosis not present

## 2022-03-02 DIAGNOSIS — I517 Cardiomegaly: Secondary | ICD-10-CM | POA: Diagnosis not present

## 2022-03-02 DIAGNOSIS — Z452 Encounter for adjustment and management of vascular access device: Secondary | ICD-10-CM | POA: Diagnosis not present

## 2022-03-02 DIAGNOSIS — M47814 Spondylosis without myelopathy or radiculopathy, thoracic region: Secondary | ICD-10-CM | POA: Diagnosis not present

## 2022-03-02 LAB — CBC
HCT: 29.3 % — ABNORMAL LOW (ref 36.0–46.0)
HCT: 22.4 % — ABNORMAL LOW (ref 36.0–46.0)
HCT: 19.9 % — ABNORMAL LOW (ref 36.0–46.0)
Hemoglobin: 9.3 g/dL — ABNORMAL LOW (ref 12.0–15.0)
Hemoglobin: 7.1 g/dL — ABNORMAL LOW (ref 12.0–15.0)
Hemoglobin: 6.6 g/dL — CL (ref 12.0–15.0)
MCH: 28.2 pg (ref 26.0–34.0)
MCH: 28.3 pg (ref 26.0–34.0)
MCH: 29.5 pg (ref 26.0–34.0)
MCHC: 31.7 g/dL (ref 30.0–36.0)
MCHC: 31.7 g/dL (ref 30.0–36.0)
MCHC: 33.2 g/dL (ref 30.0–36.0)
MCV: 88.8 fL (ref 80.0–100.0)
MCV: 89.2 fL (ref 80.0–100.0)
MCV: 88.8 fL (ref 80.0–100.0)
Platelets: 342 10*3/uL (ref 150–400)
Platelets: 271 10*3/uL (ref 150–400)
Platelets: 270 10*3/uL (ref 150–400)
RBC: 3.3 MIL/uL — ABNORMAL LOW (ref 3.87–5.11)
RBC: 2.51 MIL/uL — ABNORMAL LOW (ref 3.87–5.11)
RBC: 2.24 MIL/uL — ABNORMAL LOW (ref 3.87–5.11)
RDW: 15.4 % (ref 11.5–15.5)
RDW: 15.3 % (ref 11.5–15.5)
RDW: 15.4 % (ref 11.5–15.5)
WBC: 8.2 10*3/uL (ref 4.0–10.5)
WBC: 8.1 10*3/uL (ref 4.0–10.5)
WBC: 8 10*3/uL (ref 4.0–10.5)
nRBC: 0 % (ref 0.0–0.2)
nRBC: 0 % (ref 0.0–0.2)
nRBC: 0 % (ref 0.0–0.2)

## 2022-03-02 LAB — HEMOGLOBIN AND HEMATOCRIT, BLOOD
HCT: 28.2 % — ABNORMAL LOW (ref 36.0–46.0)
Hemoglobin: 9 g/dL — ABNORMAL LOW (ref 12.0–15.0)

## 2022-03-02 LAB — BASIC METABOLIC PANEL
Anion gap: 11 (ref 5–15)
BUN: 64 mg/dL — ABNORMAL HIGH (ref 8–23)
CO2: 25 mmol/L (ref 22–32)
Calcium: 8.7 mg/dL — ABNORMAL LOW (ref 8.9–10.3)
Chloride: 107 mmol/L (ref 98–111)
Creatinine, Ser: 2.95 mg/dL — ABNORMAL HIGH (ref 0.44–1.00)
GFR, Estimated: 17 mL/min — ABNORMAL LOW (ref >60)
Glucose, Bld: 94 mg/dL (ref 70–99)
Potassium: 4.1 mmol/L (ref 3.5–5.1)
Sodium: 143 mmol/L (ref 135–145)

## 2022-03-02 LAB — PROTIME-INR
INR: 1.2 (ref 0.8–1.2)
Prothrombin Time: 14.6 seconds (ref 11.4–15.2)

## 2022-03-02 LAB — PREPARE RBC (CROSSMATCH)

## 2022-03-02 LAB — MAGNESIUM: Magnesium: 2 mg/dL (ref 1.7–2.4)

## 2022-03-02 MED ORDER — PREGABALIN 50 MG PO CAPS
50.0000 mg | ORAL_CAPSULE | Freq: Three times a day (TID) | ORAL | 0 refills | Status: DC
  Filled 2022-03-02: qty 90, 30d supply, fill #0

## 2022-03-02 MED ORDER — SODIUM CHLORIDE 0.9% IV SOLUTION: Freq: Once | INTRAVENOUS | Status: AC

## 2022-03-02 MED ORDER — FUROSEMIDE 80 MG PO TABS
80.0000 mg | ORAL_TABLET | Freq: Two times a day (BID) | ORAL | 0 refills | Status: DC
  Filled 2022-03-02: qty 60, 30d supply, fill #0

## 2022-03-02 MED ORDER — HYDRALAZINE HCL 25 MG PO TABS
25.0000 mg | ORAL_TABLET | Freq: Three times a day (TID) | ORAL | Status: DC
  Administered 2022-03-02 – 2022-03-06 (×12): 25 mg via ORAL
  Filled 2022-03-02 (×12): qty 1

## 2022-03-02 MED ORDER — PANTOPRAZOLE SODIUM 40 MG IV SOLR
40.0000 mg | Freq: Two times a day (BID) | INTRAVENOUS | Status: DC
  Administered 2022-03-02 – 2022-03-04 (×4): 40 mg via INTRAVENOUS
  Filled 2022-03-02 (×4): qty 10

## 2022-03-02 MED ORDER — CHLORHEXIDINE GLUCONATE CLOTH 2 % EX PADS
6.0000 | MEDICATED_PAD | Freq: Every day | CUTANEOUS | Status: DC
  Administered 2022-03-03 – 2022-03-05 (×3): 6 via TOPICAL

## 2022-03-02 MED ORDER — POLYETHYLENE GLYCOL 3350 17 G PO PACK
17.0000 g | PACK | Freq: Two times a day (BID) | ORAL | Status: DC
  Filled 2022-03-02: qty 1

## 2022-03-02 MED ORDER — HYDRALAZINE HCL 25 MG PO TABS
25.0000 mg | ORAL_TABLET | Freq: Three times a day (TID) | ORAL | 0 refills | Status: DC
  Filled 2022-03-02: qty 90, 30d supply, fill #0

## 2022-03-02 MED ORDER — PREGABALIN 100 MG PO CAPS
100.0000 mg | ORAL_CAPSULE | Freq: Three times a day (TID) | ORAL | Status: DC
  Administered 2022-03-02 – 2022-03-05 (×10): 100 mg via ORAL
  Filled 2022-03-02 (×10): qty 1

## 2022-03-02 MED ORDER — ASPIRIN 81 MG PO TBEC
81.0000 mg | DELAYED_RELEASE_TABLET | Freq: Every day | ORAL | 12 refills | Status: DC
  Filled 2022-03-02: qty 30, 30d supply, fill #0

## 2022-03-02 NOTE — TOC Progression Note (Signed)
Transition of Care [facility]) - Progression Note    Patient Details  Name: Tiffany [name] MRN: [id number] Date of Birth: 1960-05-16  Transition of Care [facility]) CM/SW [place], LCSW Phone Number: 03/02/2022, 10:31 AM  Clinical Narrative:    [name] met with pt at bedside to discuss dc plans to SNF. [name] explained the recommendation for SNF. Pt refused SNF and stated she was going home. Pt explained that she has had [place] [facility] before but couldn't recall the name of the [facility] agency. Pt stated "nothing in the nursing home excites her." [name] told pt that a RNCM will come speak with her regarding setting up [facility]   TOC will continue to follow this admission.    Barriers to Discharge: Continued Medical Work up  Expected Discharge Plan and Services                                               Social Determinants of Health (SDOH) Interventions SDOH Screenings   Food Insecurity: Unknown (02/05/2022)  Housing: Medium Risk (02/07/2022)  Transportation Needs: Unmet Transportation Needs (02/07/2022)  Utilities: Unknown (02/05/2022)  Alcohol Screen: Low Risk  (07/24/2021)  Depression (PHQ2-9): High Risk (02/27/2022)  Financial Resource Strain: Medium Risk (07/24/2021)  Physical Activity: Inactive (07/24/2021)  Social Connections: Moderately Isolated (07/24/2021)  Stress: Stress Concern Present (07/24/2021)  Tobacco Use: Medium Risk (02/28/2022)    Readmission Risk Interventions    02/07/2022    2:22 PM 05/24/2021    6:19 PM 07/26/2020    2:21 PM  Readmission Risk Prevention Plan  Transportation Screening Complete Complete Complete  Medication Review Press photographer) Complete Referral to Pharmacy Complete  PCP or Specialist appointment within 3-5 days of discharge Complete Complete Complete  HRI or Home Care Consult Complete Complete Complete  SW Recovery Care/Counseling Consult Complete Complete Complete  Palliative Care Screening Not Applicable  Not [place] Not  Applicable Complete Complete   Beckey Rutter, MSW, LCSWA, LCASA Transitions of Care  Clinical Social Worker I

## 2022-03-02 NOTE — Progress Notes (Signed)
Occupational Therapy Treatment Patient Details Name: Tiffany [name] MRN: [id number] DOB: 31-Dec-1960 Today's Date: 03/02/2022   History of present illness Tiffany [name] is a 62 y.o. female presenting with shortness of breath, leg swelling, admitted with CHF exacerbation. Has also had worsening renal function over the past few months. PMHx includes CAD s/p MI x2, CHF, HTN, HLD, CKD, schizophrenia/schizoaffective disorder, asthma/COPD, stroke, GERD, chronic pain syndrom, anoxic brain injury, PTSD, CVA and polysubstance use.   OT comments  Pt progressing slowly towards acute OT goals. Focus of session was bed level exercises involving trunk and BLE. Issued theraband and HEP given for BUE strengthening, pt declined completing UE exercises at this time due to pain. Also instructed in gluteal sets at bed level. D/c recommendation for SNF remains unchanged as does pt's decision to d/c home instead of going to SNF. Will need to maximize [facility] resources.   Recommendations for follow up therapy are one component of a multi-disciplinary discharge planning process, led by the attending physician.  Recommendations may be updated based on patient status, additional functional criteria and insurance authorization.    Follow Up Recommendations  Skilled nursing-short term rehab (<3 hours/day) (Pt currently refusing SNF)     Assistance Recommended at Discharge Frequent or constant Supervision/Assistance  Patient can return home with the following  Two people to help with walking and/or transfers;A lot of help with walking and/or transfers;A lot of help with bathing/dressing/bathroom;Assist for transportation;Help with stairs or ramp for entrance;Assistance with cooking/housework;Two people to help with bathing/dressing/bathroom   Equipment Recommendations  Other (comment) (TBD)    Recommendations for Other Services      Precautions / Restrictions Precautions Precautions: Fall Precaution Comments:  Non-ambulatory at baseline, performs sliding board/lateral scoot transfers at baseline. h/o 4-5+ falls in last 6 months requiring EMS to assist her in getting up Restrictions Weight Bearing Restrictions: No       Mobility Bed Mobility Overal bed mobility: Needs Assistance             General bed mobility comments: Pt able to come to long sitting x4 using bed rails to pull forward. +2 for rolling or further bed mobility requiring use of BLE    Transfers                         Balance Overall balance assessment: History of Falls, Needs assistance                                         ADL either performed or assessed with clinical judgement   ADL Overall ADL's : Needs assistance/impaired                                       General ADL Comments: Pt able to come to longsitting position 4x using bed rails to pull forward. BUE/BLE ROM exercies at bed level. Pt needs +2 assist and only +1 assist availabel during session.    Extremity/Trunk Assessment Upper Extremity Assessment Upper Extremity Assessment: Generalized weakness   Lower Extremity Assessment Lower Extremity Assessment: Defer to PT evaluation        Vision       Perception     Praxis      Cognition Arousal/Alertness: Awake/alert Behavior During Therapy: Flat affect Overall Cognitive  Status: No family/caregiver present to determine baseline cognitive functioning                                 General Comments: No gross deficits        Exercises Exercises: Other exercises Other Exercises Other Exercises: Issued level 1 theraband and instructed in HEP for BUE strengthening. Also included gluteal sets in supine.    Shoulder Instructions       General Comments      Pertinent Vitals/ Pain       Pain Assessment Pain Assessment: Faces Faces Pain Scale: Hurts little more Pain Location: BLE, 5th digit L hand Pain Descriptors /  Indicators: Aching, Discomfort Pain Intervention(s): Limited activity within patient's tolerance, Monitored during session, Repositioned  Home Living                                          Prior Functioning/Environment              Frequency  Min 2X/week        Progress Toward Goals  OT Goals(current goals can now be found in the care plan section)  Progress towards OT goals: Progressing toward goals (slow progression)  Acute Rehab OT Goals Patient Stated Goal: home, currently refusing SNF OT Goal Formulation: With patient Time For Goal Achievement: 03/15/22 Potential to Achieve Goals: Fair ADL Goals Pt Will Perform Grooming: with modified independence Pt Will Perform Lower Body Bathing: with min assist;sitting/lateral leans;sit to/from stand;with adaptive equipment Pt Will Perform Lower Body Dressing: with min assist;sitting/lateral leans;sit to/from stand;with adaptive equipment Pt Will Transfer to Toilet: with min assist;bedside commode;with transfer board Pt Will Perform Toileting - Clothing Manipulation and hygiene: with min assist;sitting/lateral leans;sit to/from stand;with adaptive equipment Pt/caregiver will Perform Home Exercise Program: Increased strength;Both right and left upper extremity;With theraband;Independently;With written HEP provided Additional ADL Goal #1: Patient will identify at least 3 fall prevention strategies to employ at home in order to maximize function and safety during ADLs and decrease caregiver burden while preventing possible injury and rehospitalization.  Plan Discharge plan remains appropriate    Co-evaluation                 AM-PAC OT "6 Clicks" Daily Activity     Outcome Measure   Help from another person eating meals?: A Little Help from another person taking care of personal grooming?: A Little Help from another person toileting, which includes using toliet, bedpan, or urinal?: Total Help from  another person bathing (including washing, rinsing, drying)?: A Lot Help from another person to put on and taking off regular upper body clothing?: A Little Help from another person to put on and taking off regular lower body clothing?: Total 6 Click Score: 13    End of Session    OT Visit Diagnosis: Other abnormalities of gait and mobility (R26.89);Repeated falls (R29.6);Muscle weakness (generalized) (M62.81);History of falling (Z91.81);Pain Pain - part of body: Leg;Ankle and joints of foot;Knee   Activity Tolerance Patient limited by pain;Patient limited by fatigue   Patient Left in bed;with call bell/phone within reach;with bed alarm set   Nurse Communication          Time: [id number] OT Time Calculation (min): 15 min  Charges: OT General Charges $OT Visit: 1 Visit OT Treatments $Self Care/Home Management : 8-22 mins  Tyrone Schimke, OT Acute Rehabilitation Services Office: [phone]    Hortencia Pilar 03/02/2022, 12:39 PM

## 2022-03-02 NOTE — Progress Notes (Addendum)
Brief Note:  Called to bedside to see patient for blood clots in stool (see below). Patient alert, conversing appropriately. Endorses mild abdominal tenderness. CBC ordered. Discharge plan deferred for today.    Camelia Phenes, MD 2/15/20244:33 PM

## 2022-03-02 NOTE — Progress Notes (Signed)
FMTS Interim Progress Note   Paged by nurse that patient had at rectum after passing large formed stool.  Vital signs were stable. Nurse applied pressure and placed wet towel to rectum.   Went to bedside to evaluate patient.  Patient appeared stable and was conversant with stable vital signs.  She said she had been on loperamide prior to admission for diarrhea, but that she had history of hemorrhoids.  There was about 2-3 tablespoons of clotted blood on outside of rectum which seemed to be ruptured hemorrhoids. Applied abd pad to the area.  Patient had full abdomen that is soft but diffusely tender.  Most likely has constipation with stool burden causing abdominal pain.   - Miralax BID  - 8 am H&H  - Continue to monitor rectal bleeding   Lowry Ram, MD  [place] PGY-1

## 2022-03-02 NOTE — Procedures (Signed)
Central Venous Catheter Insertion Procedure Note  Tiffany [name]  OG:[id number]  01/10/1961  Date:03/02/22  Time:10:28 PM   Provider Performing:Edman Lipsey R Shanasia Ibrahim   Procedure: Insertion of Non-tunneled Central Venous [phone]) with US guidance [id number])   Indication(s) Difficult access  Consent Risks of the procedure as well as the alternatives and risks of each were explained to the patient and/or caregiver.  Consent for the procedure was obtained and is signed in the bedside chart  Anesthesia Topical only with 1% lidocaine   Timeout Verified patient identification, verified procedure, site/side was marked, verified correct patient position, special equipment/implants available, medications/allergies/relevant history reviewed, required imaging and test results available.  Sterile Technique Maximal sterile technique including full sterile barrier drape, hand hygiene, sterile gown, sterile gloves, mask, hair covering, sterile ultrasound probe cover (if used).  Procedure Description Area of catheter insertion was cleaned with chlorhexidine and draped in sterile fashion.  With real-time ultrasound guidance a central venous catheter was placed into the right internal jugular vein. Nonpulsatile blood flow and easy flushing noted in all ports.  The catheter was sutured in place and sterile dressing applied.  Complications/Tolerance None; patient tolerated the procedure well. Chest X-ray is ordered to verify placement for internal jugular or subclavian cannulation.   Chest x-ray is not ordered for femoral cannulation.  EBL Minimal  Specimen(s) None   Otilio Carpen Taje Tondreau, PA-C

## 2022-03-02 NOTE — Progress Notes (Addendum)
[facility] for cardiology consultation for diuretic recommendations for patient to be discharged on. They will see patient and place recs.

## 2022-03-02 NOTE — Discharge Instructions (Addendum)
Dear Tiffany [name],  Thank you for letting us participate in your care. You were hospitalized for difficulty breathing and leg swelling and diagnosed with Acute on chronic diastolic heart failure ([place]).  You also developed a Gastrointestinal Bleed for which you received a blood transfusion. The bleeding resolved on it's own and was thought to be coming from diverticulosis which are little out pouchings in your colon (noted in last colonoscopy).   POST-HOSPITAL & CARE INSTRUCTIONS Continue the medications listed below Do not take lasix until restarted by your cardiologist.  Do not take aspirin again as this has increased risk of bleeding.  Go to your follow up appointments (listed below)  DOCTOR'S APPOINTMENT   Future Appointments  Date Time Provider [place]  03/07/2022 10:20 AM [name], MD [facility]  03/07/2022  3:50 PM ACCESS TO CARE POOL [facility] [place]  03/20/2022  1:30 PM Leslie Dales, DO [facility] [place]    Follow-up Information     [name], MD Follow up on 03/07/2022.   Specialty: Cardiology Why: 10:20 AM Contact information: [name] [name] [address] [place], [place] Follow up.   Specialty: [place] Why: Agency will call you to set up atp times Contact information: [place] [address] [phone]         Precious Gilding, DO Follow up on 03/07/2022.   Specialty: Family Medicine Why: 3:50 PM Contact information: [place] [address] [phone]                Return if:  Having shortness of breath, gaining a lot of weight in one day, blood in stools or dark colored stools  Take care and be well!  [facility]  [place], [place] [address] [phone]    Heart Failure Nutrition Therapy  This  nutrition therapy will help you feel better and support your heart.  This plan focuses on: Limiting sodium in your diet. Salt (sodium) makes your body hold water. When your body holds too much water, you can feel shortness of breath and swelling. You can prevent these symptoms by eating less salt. Limiting fluid in your diet. For some patients, drinking too much fluid can make heart failure worse. It can cause symptoms such as shortness of breath and swelling. Limiting fluids can help relieve some of your symptoms. Managing your weight. Your registered dietitian nutritionist (RDN) can help you choose a healthy weight for your body type. You can achieve these goals by: Reading food labels to keep track of how much sodium is in the foods you eat. Limiting foods that are high in sodium. Checking your weight to make sure you're not retaining too much fluid. Reading the Food Label: How Much Sodium Is Too Much? The nutrition plan for heart failure usually limits the sodium you get from food and drinks to 2,000 milligrams per day. Salt is the main source of sodium. Read the nutrition label to find out how much sodium is in 1 serving of a food. Select foods with 140 milligrams of sodium or less per serving. Foods with more than 300 milligrams of sodium per serving may not fit into a reduced-sodium meal plan. Check serving sizes. If you eat more than 1 serving, you will get more sodium than the amount  listed. Cutting Back on Sodium Avoid processed foods. Eat more fresh foods. Fresh and frozen fruits and vegetables without added juices or sauces are naturally low in sodium. Fresh meats are lower in sodium than processed meats, such as bacon, sausage, and hot dogs. Read the nutrition label or ask your butcher to help you find a fresh meat that is low in sodium. Eat less salt, at the table and when cooking. Just 1 teaspoon of table salt has 2,300 milligrams of sodium. Leave the salt out of recipes for  pasta, casseroles, and soups. Ask your RDN how to cook your favorite recipes without sodium. Be a Paramedic. Look for food packages that say "salt-free" or "sodium-free." These items contain less than 5 milligrams of sodium per serving. "Very-low-sodium" products contain less than 35 milligrams of sodium per serving. "Low-sodium" products contain less than 140 milligrams of sodium per serving. "Unsalted" or "no added salt" products may still be high in sodium. Check the nutrition label. Add flavors to your food without adding sodium. Try lemon juice, lime juice, fruit juice, or vinegar. Dry or fresh herbs add flavor. Try basil, bay leaf, dill, rosemary, parsley, sage, dry mustard, nutmeg, thyme, and paprika. Pepper, red pepper flakes, and cayenne pepper can add spice to your meals without adding sodium. Hot sauce contains sodium, but if you use just a drop or two, it will not add up to much. Buy a sodium-free seasoning blend or make your own at home. Use caution when you eat outside your home. Restaurant foods can be very high in sodium. Ask for nutrition information. Many restaurants provide nutrition facts on their menus or websites. Let your server know that you want your food to be cooked without salt. Ask for your salad dressing and sauces to come "on the side." Fluid Restriction Your doctor may ask you to follow a fluid restriction in addition to taking diuretics (water pills). Ask your doctor how much fluid you can have. Foods that are liquid at room temperature are considered a fluid, such as popsicles, soup, ice cream, and Jell-O. Here are some common conversions that will help you measure your fluid intake every day: 1,000 milliliters = 1 liter or 4 cups 1 fluid ounce = 30 milliliters  1 cup = 240 milliliters 2,000 milliliters = 2 liters or 8 cups  1,500 milliliters = 1 liters or 6 cups    Weight Monitoring Weigh yourself each day. Sudden weight gain is a sign that fluid is  building up in your body. Follow these guidelines: Weigh yourself every morning. If you gain 3 or more pounds in 1-2 days or 5 or more pounds within 1 week, call your doctor. Your doctor may adjust your medicine to get rid of the extra fluid. Talk with your doctor or RDN about what a healthy weight is for you. Talk with your doctor to find out what type of physical activity is best for you.  Foods Recommended Food Group Recommended Foods  Grains Bread with less than 80 milligrams sodium per slice (yeast breads usually have less sodium than those made with baking soda) Homemade bread made with reduced-sodium baking soda Many cold cereals, especially shredded wheat and puffed rice Oats, grits, or cream of wheat Dry pastas, noodles, quinoa, and rice  Vegetables Fresh and frozen vegetables without added sauces, salt, or sodium Homemade soups (salt free or low sodium) Low-sodium or sodium-free canned vegetables and soups  Fruits Fresh and canned fruits Dried fruits, such as raisins, cranberries, and  prunes  Dairy (Milk and Milk Products) Milk or milk powder Rice milk and soy milk Yogurt, including Greek yogurt Small amounts of natural, block cheese or reduced-sodium cheese (Swiss, ricotta, and fresh mozzarella are lower in sodium than others) Regular or soft cream cheese and low-sodium cottage cheese  Protein Foods (Meat, Poultry, Fish, Writer) Fresh meats and fish [place] bacon (except if packaged in a sodium solution) Canned or packed tuna (no more than 4 ounces at 1 serving) Dried beans and peas; edamame (fresh soybeans) Eggs or egg beaters (if  less than 200 mg per serving) Unsalted nuts or peanut butter  Desserts and Snacks Fresh fruit or applesauce Angel food cake Granola bars Unsalted pretzels, popcorn, or nuts Pudding or gelatin with whipped cream topping Homemade rice-crispy treats Vanilla wafers Frozen fruit bars  Fats Tub or liquid margarine Unsaturated fat oils (canola,  olive, corn, sunflower, safflower, peanut)  Condiments Fresh or dried herbs; low-sodium ketchup; vinegar; lemon or lime juice; pepper; salt-free seasoning mixes and marinades (salt-free seasoning blend); simple salad dressings (vinegar and oil); salt-free sauces   Foods Not Recommended Food Group Foods Not Recommended  Grains Breads or crackers topped with salt Cereals (hot/cold) with more than 300 milligrams sodium per serving Biscuits, cornbread, and other "quick" breads prepared with baking soda Prepackaged bread crumbs Self-rising flours  Vegetables Canned vegetables (unless they are salt free or low sodium) Frozen vegetables with seasoning and sauces Sauerkraut and pickled vegetables Canned or dried soups (unless they are salt free or low  sodium) [place] fries and onion rings  Fruits Dried fruits preserved with sodium-containing additives  Dairy (Milk and Milk Products) Buttermilk Processed cheeses  Cottage cheese (unless a low-sodium variety) Feta cheese; shredded cheese (has more sodium than block cheese); "singles" slices and string cheese  Protein Foods (Meat, Poultry, Fish, Beans) Cured meats: bacon, ham, sausage, pepperoni, and hot dogs Canned meats: chili, Vienna sausage, sardines, and ham Smoked fish and meats Frozen meals that have more than 600 milligrams sodium  Fats Salted butter or margarine  Condiments Salt, sea salt, kosher salt, onion salt, and garlic salt Seasoning mixes containing salt (Lemon Pepper or Bouillon cubes) Catsup or ketchup, BBQ sauce, Worcestershire and soy sauce Salsa, pickles, olives, relish Salad dressings: ranch, blue cheese, [place], and [place]   Alcohol Check with your doctor.   Heart Failure Sample 1-Day Menu View Nutrient Info Breakfast 1 cup regular oatmeal made with water or milk 1 cup reduced-fat (2%) milk 1 medium banana 1 slice whole wheat bread 1 tablespoon salt-free peanut butter  Morning Snack 1/2 cup dried cranberries  Lunch  3 ounces grilled chicken breast 1 cup salad greens Olive oil and vinegar dressing (for greens) 5 unsalted or low-sodium crackers Fruit plate with 1/4 cup strawberries 1/2 sliced orange (for fruit plate) 1 peach half (for fruit plate)  Afternoon Snack 1 ounce low-sodium [place] 1 piece whole wheat bread  Evening Meal 3 ounces herb-baked fish 1 baked potato 2 teaspoons soft margarine (trans fat-free) (for potato) Sliced tomatoes 1/2 cup steamed spinach drizzled with lemon juice 3-inch square of angel food cake Fresh strawberries (2) (for cake)  Evening Snack 2 tablespoons salt-free peanut butter 5 low-sodium crackers  Daily Sum Nutrient Unit Value  Macronutrients  Energy kcal 1890  Energy kJ 7906  Protein g 95  Total lipid (fat) g 56  Carbohydrate, by difference g 270  Fiber, total dietary g 31  Sugars, total g 99  Minerals  Calcium, Ca mg 949  Iron, Fe  mg 27  Sodium, Na mg 1538  Vitamins  Vitamin C, total ascorbic acid mg 118  Vitamin A, IU IU 18639  Vitamin D IU 232  Lipids  Fatty acids, total saturated g 13  Fatty acids, total monounsaturated g 22  Fatty acids, total polyunsaturated g 16  Cholesterol mg 126     Heart Failure Vegan Sample 1-Day Menu View Nutrient Info Breakfast 1 cup oatmeal  cup walnuts 1 banana 1 cup soymilk fortified with calcium, vitamin B12, and vitamin D  Lunch 1 large whole wheat pita Salad made with: 1 cup chickpeas 1 cup lettuce  cup cherry tomatoes 1 cup strawberries 1 tablespoon olive oil 1 tablespoon balsamic vinegar  Evening Meal  cup tofu 2 teaspoons olive oil Pinch garlic powder 1 baked potato 1 tablespoon margarine, soft, tub  cup cooked spinach with: Squeeze of lemon 1 cup soymilk fortified with calcium, vitamin B12, and vitamin D  Evening Snack 1 tablespoon peanut butter, without salt  ounce pretzels, without salt  Daily Sum Nutrient Unit Value  Macronutrients  Energy kcal 1848  Energy kJ 7735  Protein g 74   Total lipid (fat) g 83  Carbohydrate, by difference g 223  Fiber, total dietary g 39  Sugars, total g 44  Minerals  Calcium, Ca mg 1325  Iron, Fe mg 20  Sodium, Na mg 1098  Vitamins  Vitamin C, total ascorbic acid mg 140  Vitamin A, IU IU 15807  Vitamin D IU 238  Lipids  Fatty acids, total saturated g 13  Fatty acids, total monounsaturated g 33  Fatty acids, total polyunsaturated g 32  Cholesterol mg 0     Heart Failure Vegetarian (Lacto-Ovo) Sample 1-Day Menu View Nutrient Info Breakfast 1 cup oatmeal  cup walnuts 1 banana 1 cup fat-free milk  Lunch 1 large whole wheat pita Salad made with:  cup chickpeas 1 ounce mozzarella cheese 1 cup lettuce  cup cherry tomatoes 1 cup strawberries 1 tablespoon olive oil 1 tablespoon balsamic vinegar  Evening Meal  cup tofu 2 teaspoons olive oil Pinch garlic powder 1 baked potato 1 tablespoon margarine, soft, tub  cup cooked spinach with: Squeeze of lemon 1 cup fat-free milk  Evening Snack 1 tablespoon peanut butter, without salt 1 apple  Daily Sum Nutrient Unit Value  Macronutrients  Energy kcal 1847  Energy kJ 7734  Protein g 76  Total lipid (fat) g 79  Carbohydrate, by difference g 231  Fiber, total dietary g 35  Sugars, total g 83  Minerals  Calcium, Ca mg 1488  Iron, Fe mg 16  Sodium, Na mg 1100  Vitamins  Vitamin C, total ascorbic acid mg 149  Vitamin A, IU IU 16116  Vitamin D IU 234  Lipids  Fatty acids, total saturated g 15  Fatty acids, total monounsaturated g 32  Fatty acids, total polyunsaturated g 26  Cholesterol mg 28    Copyright 2020  [employer]. All rights reserved

## 2022-03-02 NOTE — Consult Note (Addendum)
Cardiology Consultation   Patient ID: Tiffany [name] MRN: [id number]; DOB: 17-Jan-1961  Admit date: 02/28/2022 Date of Consult: 03/02/2022  PCP:  Leslie Dales, [facility] Cardiologist:  Fransico Him, MD     Patient Profile:   Tiffany [name] is a 62 y.o. female with a hx of HFpEF, COPD on O2, hypertension, schizophrenia, hyperlipidemia, CKD, history of cocaine use who is being seen 03/02/2022 for the evaluation of CHF at the request of Dr. McDiarmid.  History of Present Illness:   Tiffany [name] is a 62 yo female with PMH noted above. She has been followed by Dr. Harrington Challenger as outpatient.  She underwent a right and left heart catheterization in 2019 which showed normal coronaries, mild PAH and LVEF was noted at 60 to 65% on echo.   She was last seen in the office on 05/2020 and reported increasing lower extremity edema.  She had been recently seen in the internal medicine clinic and prescribed Lasix 20 mg daily as well as compression hose.  It was recommended that she continue this treatment and if no improvement would need outpatient echocardiogram.   Echo 01/2022 showed LVEF of 60 to 65%, no regional wall motion abnormality, grade 2 diastolic dysfunction, normal RV size and function, severely dilated left atrium, mildly dilated right atrium, trivial MR.   She presented to the ED on 2/13 with complaints of generalized body aches, increased lower extremity edema after being seen at PCP office that morning.  Admission labs showed sodium 143, potassium 4.4, creatinine 3.36, BNP 542, high-sensitivity troponin 16>> 15, WBC 11.3, hemoglobin 9.2, TSH 0.5.  EKG shows sinus rhythm, 73 bpm.  Chest x-ray negative.  Admitted to medicine for further management.  She has been diuresed with IV Lasix, net -7.6 L.  Weight is down from 218 lbs>>206lbs. Cardiology asked to evaluate regarding home diuretic dosing.   In talking with the patient she currently lives at home independently with  an aide who comes into her house daily.  States that she had been compliant with her medication including her torsemide and felt she was having decent urine output but noticed lower extremity edema as well as orthopnea.  When asked about her eating habits she reports no limits on her sodium restriction.  Frequently eats processed foods as well as canned foods and frozen meals.  Does not weigh herself daily.  Past Medical History:  Diagnosis Date   Agitation 11/22/2017   Anoxic brain injury ([place]) 09/08/2016   C. Arrest due to respiratory failure and COPD exacerbation   Anxiety    Arthritis    "all over" (04/10/2016)   Asthma 10/18/2010   Binge eating disorder    Cardiac arrest ([place]) 09/08/2016   PEA   Carotid artery stenosis    1-39% bilateral by dopplers 11/2016   Chronic diastolic (congestive) heart failure (HCC)    Chronic kidney disease, stage 3 (HCC)    Chronic pain syndrome 06/18/2012   Chronic post-traumatic stress disorder (PTSD) 05/27/2018   Chronic respiratory failure with hypoxia and hypercapnia ([place]) 06/22/2015   TRILOGY Vent >AVAPA-ES., Vt target 200-400, Max P 30 , PS max 20 , PS min 6-10 , E Max 6, E Min 4, Rate Auto AVAPS Rate 2 (titrate for pt comfort) , bleed O2 at 5l/m continuous flow .    CKD (chronic kidney disease) 02/03/2022   Closed displaced fracture of fifth metacarpal bone 03/21/2018   Cocaine use disorder, severe, in sustained remission ([place]) 12/17/2015  Complication of anesthesia    decreased bp, decreased heart rate   COPD (chronic obstructive pulmonary disease) ([place]) 07/08/2014   Depression    Diabetic neuropathy ([place]) 04/24/2011   Difficulty with speech 01/24/2018   Disorder of nervous system    Drug abuse ([place]) 11/21/2017   Dyslipidemia 04/24/2011   Elevated troponin 04/28/2012   Emphysema    Encephalopathy 11/21/2017   Essential hypertension 03/22/2016   Fibula fracture 07/10/2016   Frequent falls 10/11/2017   GERD (gastroesophageal reflux  disease)    Gout 04/11/2017   Heart attack ([place]) 1980s   History of blood transfusion 1994   "couldn't stop bleeding from my period"   History of drug abuse in remission ([place]) 11/28/2015   Quit in 2017   Hyperlipidemia LDL goal <70    Incontinence    Manic depression ([place])    Morbid obesity ([place]) 10/18/2010   Obstructive sleep apnea 10/18/2010   On home oxygen therapy    "6L; 24/7" (04/10/2016)   OSA on CPAP    "wear mask sometimes" (04/10/2016)   Paranoid ([place])    "sometimes; I'm on RX for it" (04/10/2016)   Prolonged Q-T interval on ECG    Rectal bleeding 12/31/2015   Schizoaffective disorder, bipolar type ([place]) 04/05/2018   Seasonal allergies    Seborrheic keratoses 12/31/2013   Seizures ([place])    "don't know what kind; last one was ~ 1 yr ago" (04/10/2016)   Sinus bradycardia    Stroke [facility]) 1980s   denies residual on 04/10/2016   Thrush 09/19/2013   Type 2 diabetes mellitus ([place]) 10/18/2010    Past Surgical History:  Procedure Laterality Date   CESAREAN SECTION  1997   COLONOSCOPY WITH PROPOFOL N/A 04/01/2021   Procedure: COLONOSCOPY WITH PROPOFOL;  Surgeon: Carol Ada, MD;  Location: [facility];  Service: Gastroenterology;  Laterality: N/A;  Rectal bleeding with drop in hemoglobin to 7.2 g/dL   HERNIA REPAIR     IR CHOLANGIOGRAM EXISTING TUBE  07/20/2016   IR PERC CHOLECYSTOSTOMY  05/10/2016   IR RADIOLOGIST EVAL & MGMT  06/08/2016   IR RADIOLOGIST EVAL & MGMT  06/29/2016   IR SINUS/FIST TUBE CHK-NON GI  07/12/2016   RIGHT/LEFT HEART CATH AND CORONARY ANGIOGRAPHY N/A 06/19/2017   Procedure: RIGHT/LEFT HEART CATH AND CORONARY ANGIOGRAPHY;  Surgeon: Jolaine Artist, MD;  Location: [facility];  Service: Cardiovascular;  Laterality: N/A;   TIBIA IM NAIL INSERTION Right 07/12/2016   Procedure: INTRAMEDULLARY (IM) NAIL RIGHT TIBIA;  Surgeon: Leandrew Koyanagi, MD;  Location: [facility];  Service: Orthopedics;  Laterality: Right;   UMBILICAL HERNIA REPAIR  ~ 1963   "that's why  I don't have a belly button"   VAGINAL HYSTERECTOMY       Home Medications:  Prior to Admission medications   Medication Sig Start Date End Date Taking? Authorizing Provider  albuterol (VENTOLIN HFA) 108 (90 Base) MCG/ACT inhaler Inhale 2 puffs into the lungs every 6 (six) hours as needed for wheezing or shortness of breath. 01/05/22  Yes Leslie Dales, DO  atorvastatin (LIPITOR) 20 MG tablet Take 1 tablet (20 mg total) by mouth daily. 06/24/21  Yes [name], MD  carvedilol (COREG) 6.25 MG tablet Take 1 tablet (6.25 mg total) by mouth 2 (two) times daily with a meal. 02/07/22 03/09/22 Yes Lilland, Alana, DO  dicyclomine (BENTYL) 10 MG capsule Take 1 capsule (10 mg total) by mouth 3 (three) times daily before meals. 01/05/22  Yes Leslie Dales, DO  DULoxetine (CYMBALTA) 30 MG capsule Take 30 mg by mouth daily. 11/22/21  Yes [provider]  escitalopram (LEXAPRO) 20 MG tablet Take 20 mg by mouth daily.   Yes [provider]  Fluticasone-Umeclidin-Vilant (TRELEGY ELLIPTA) 100-62.5-25 MCG/ACT AEPB Inhale 1 puff into the lungs daily. 01/05/22  Yes Leslie Dales, DO  loperamide (IMODIUM) 2 MG capsule Take 2 capsules (4 mg total) by mouth 3 (three) times daily as needed for diarrhea or loose stools. 02/27/22  Yes Hensel, Jamal Collin, MD  OLANZapine (ZYPREXA) 5 MG tablet Take 1 tablet (5 mg total) by mouth daily. 02/27/22  Yes Hensel, Jamal Collin, MD  omeprazole (PRILOSEC) 40 MG capsule Take 1 capsule (40 mg total) by mouth every morning. Patient taking differently: Take 40 mg by mouth in the morning and at bedtime. 06/24/21  Yes [name], MD  potassium chloride SA (KLOR-CON M) 20 MEQ tablet Take 40 mEq by mouth 2 (two) times daily.   Yes [provider]  prazosin (MINIPRESS) 2 MG capsule TAKE 1 CAPSULE(2 MG) BY MOUTH AT BEDTIME Patient taking differently: Take 2 mg by mouth at bedtime. TAKE 1 CAPSULE(2 MG) BY MOUTH AT BEDTIME 02/27/22  Yes Hensel, Jamal Collin, MD   pregabalin (LYRICA) 100 MG capsule Take 1 capsule (100 mg total) by mouth 2 (two) times daily. 02/27/22  Yes Hensel, Jamal Collin, MD  SAPHRIS 5 MG SUBL 24 hr tablet Place 5 mg under the tongue 2 (two) times daily. 01/27/22  Yes [provider]  torsemide (DEMADEX) 20 MG tablet Take 3 tablets (60 mg total) by mouth daily. 02/27/22  Yes Hensel, Jamal Collin, MD  febuxostat (ULORIC) 40 MG tablet Take 1 tablet (40 mg total) by mouth daily. Patient not taking: Reported on 02/24/2022 01/05/22   Leslie Dales, DO    Inpatient Medications: Scheduled Meds:  asenapine  5 mg Sublingual BID   aspirin EC  81 mg Oral Daily   atorvastatin  20 mg Oral Daily   carvedilol  6.25 mg Oral BID WC   DULoxetine  30 mg Oral Daily   enoxaparin (LOVENOX) injection  30 mg Subcutaneous Q24H   escitalopram  20 mg Oral Daily   fluticasone furoate-vilanterol  1 puff Inhalation Daily   And   umeclidinium bromide  1 puff Inhalation Daily   furosemide  80 mg Oral BID   lidocaine  1 patch Transdermal Q24H   OLANZapine  5 mg Oral Daily   polyethylene glycol  17 g Oral BID   prazosin  2 mg Oral QHS   pregabalin  100 mg Oral TID   sodium chloride flush  3 mL Intravenous Q12H   Continuous Infusions:  sodium chloride     PRN Meds: sodium chloride, acetaminophen, sodium chloride flush  Allergies:    Allergies  Allergen Reactions   Hydrocodone Shortness Of Breath   Latuda [Lurasidone Hcl] Anaphylaxis   Magnesium-Containing Compounds Anaphylaxis and Other (See Comments)    Tolerated Ensure   Prednisone Anaphylaxis, Swelling and Other (See Comments)    Tongue swelling, lip swelling, throat swelling, per pt    Tramadol Anaphylaxis, Swelling and Rash   Codeine Nausea And Vomiting and Rash   Trazodone Other (See Comments)    paranoia   Peanut-Containing Drug Products Hives    Raw peanuts, but can eat peanut butter   Topamax [Topiramate] Other (See Comments)    Increases paranoia   Sulfa Antibiotics Itching    Tape Rash    Social History:   Social History  Socioeconomic History   Marital status: Widowed    Spouse name: Not on file   Number of children: 3   Years of education: 12th   Highest education level: 12th grade  Occupational History   Occupation: disabled    Comment: factory production  Tobacco Use   Smoking status: Former    Packs/day: 1.50    Years: 38.00    Total pack years: 57.00    Types: Cigarettes    Start date: 03/13/1977    Quit date: 04/10/2016    Years since quitting: 5.8    Passive exposure: Past   Smokeless tobacco: Never  Vaping Use   Vaping Use: Never used  Substance and Sexual Activity   Alcohol use: No    Alcohol/week: 0.0 standard drinks of alcohol   Drug use: Not Currently    Types: Cocaine    Comment: 04/10/2016 "last used cocaine back in November 2017"   Sexual activity: Not Currently    Birth control/protection: Surgical  Other Topics Concern   Not on file  Social History Narrative   Has 1 son, [name]   Lives with son and his boyfriend   Her house has ramps and handrails should she ever needs them.    Her mother lives down the street from her and is a good support person in addition to her son.   She drives herself, has private transportation.    Cocaine free since 02/24/16, smoke free since 04/10/16   Social Determinants of Health   Financial Resource Strain: Medium Risk (07/24/2021)   Overall Financial Resource Strain (CARDIA)    Difficulty of Paying Living Expenses: Somewhat hard  Food Insecurity: Unknown (02/05/2022)   Hunger Vital Sign    Worried About Running Out of Food in the Last Year: Patient refused    [place] in the Last Year: Patient refused  Transportation Needs: Unmet Transportation Needs (02/07/2022)   PRAPARE - Hydrologist (Medical): Yes    Lack of Transportation (Non-Medical): Yes  Physical Activity: Inactive (07/24/2021)   Exercise Vital Sign    Days of Exercise per Week: 0 days    Minutes  of Exercise per Session: 0 min  Stress: Stress Concern Present (07/24/2021)   [place]    Feeling of Stress : To some extent  Social Connections: Moderately Isolated (07/24/2021)   Social Connection and Isolation Panel [NHANES]    Frequency of Communication with Friends and Family: More than three times a week    Frequency of Social Gatherings with Friends and Family: More than three times a week    Attends Religious Services: 1 to 4 times per year    Active Member of [employer] or Organizations: No    Attends Archivist Meetings: Never    Marital Status: Widowed  Intimate Partner Violence: Unknown (02/05/2022)   Humiliation, Afraid, Rape, and Kick questionnaire    Fear of Current or Ex-Partner: Patient refused    Emotionally Abused: Patient refused    Physically Abused: Patient refused    Sexually Abused: Patient refused    Family History:    Family History  Problem Relation Age of Onset   Cancer Mother        lung   Depression Mother    Cancer Father        prostate   Depression Sister    Anxiety disorder Sister    Schizophrenia Sister    Bipolar disorder  Sister    Depression Sister    Depression Brother    Heart failure Other        cousin     ROS:  Please see the history of present illness.   All other ROS reviewed and negative.     Physical Exam/Data:   Vitals:   03/02/22 0400 03/02/22 0700 03/02/22 0800 03/02/22 0928  BP: (!) 142/75 (!) 163/79    Pulse:  (!) 59 65   Resp:  16 20   Temp:      TempSrc:      SpO2:  97% 98% 99%  Weight:      Height:        Intake/Output Summary (Last 24 hours) at 03/02/2022 1238 Last data filed at 03/02/2022 1100 Gross per 24 hour  Intake 360 ml  Output 2600 ml  Net -2240 ml      03/02/2022    1:24 AM 03/01/2022    5:00 AM 02/28/2022    3:16 AM  Last 3 Weights  Weight (lbs) 206 lb 9.6 oz 207 lb 7.3 oz 218 lb 11.1 oz  Weight (kg) 93.713 kg 94.1 kg  99.2 kg     Body mass index is 35.46 kg/m.  General:  Well nourished, well developed, in no acute distress HEENT: normal Neck: no JVD Vascular: No carotid bruits; Distal pulses 2+ bilaterally Cardiac:  normal S1, S2; RRR; 2/6 systolic murmur  Lungs:  clear to auscultation bilaterally, no wheezing, rhonchi or rales  Abd: soft, nontender, no hepatomegaly  Ext: trace bilateral LE edema Musculoskeletal:  No deformities, BUE and BLE strength normal and equal Skin: warm and dry  Neuro:  CNs 2-12 intact, no focal abnormalities noted Psych:  Normal affect   EKG:  The EKG was personally reviewed and demonstrates:   sinus rhythm, 73 bpm Telemetry:  Telemetry was personally reviewed and demonstrates:  Sinus Rhythm, 15 beat run of NSVT (2/14)  Relevant CV Studies:  Echo: 02/04/2022  IMPRESSIONS     1. Left ventricular ejection fraction, by estimation, is 60 to 65%. The  left ventricle has normal function. The left ventricle has no regional  wall motion abnormalities. The left ventricular internal cavity size was  mildly dilated. There is mild left  ventricular hypertrophy. Left ventricular diastolic parameters are  consistent with Grade II diastolic dysfunction (pseudonormalization).  Elevated left atrial pressure.   2. Right ventricular systolic function is normal. The right ventricular  size is normal. There is mildly elevated pulmonary artery systolic  pressure. The estimated right ventricular systolic pressure is [id number] mmHg.   3. Left atrial size was severely dilated.   4. Right atrial size was mildly dilated.   5. The mitral valve is normal in structure. Trivial mitral valve  regurgitation. No evidence of mitral stenosis.   6. The aortic valve is tricuspid. Aortic valve regurgitation is not  visualized. Aortic valve sclerosis is present, with no evidence of aortic  valve stenosis.   7. The inferior vena cava is dilated in size with >50% respiratory  variability, suggesting right  atrial pressure of 8 mmHg.   FINDINGS   Left Ventricle: Left ventricular ejection fraction, by estimation, is 60  to 65%. The left ventricle has normal function. The left ventricle has no  regional wall motion abnormalities. The left ventricular internal cavity  size was mildly dilated. There is   mild left ventricular hypertrophy. Left ventricular diastolic parameters  are consistent with Grade II diastolic dysfunction (pseudonormalization).  Elevated left atrial  pressure.   Right Ventricle: The right ventricular size is normal. No increase in  right ventricular wall thickness. Right ventricular systolic function is  normal. There is mildly elevated pulmonary artery systolic pressure. The  tricuspid regurgitant velocity is 2.79   m/s, and with an assumed right atrial pressure of 8 mmHg, the estimated  right ventricular systolic pressure is [id number] mmHg.   Left Atrium: Left atrial size was severely dilated.   Right Atrium: Right atrial size was mildly dilated.   Pericardium: Trivial pericardial effusion is present.   Mitral Valve: The mitral valve is normal in structure. Trivial mitral  valve regurgitation. No evidence of mitral valve stenosis.   Tricuspid Valve: The tricuspid valve is normal in structure. Tricuspid  valve regurgitation is trivial.   Aortic Valve: The aortic valve is tricuspid. Aortic valve regurgitation is  not visualized. Aortic valve sclerosis is present, with no evidence of  aortic valve stenosis. Aortic valve mean gradient measures 9.0 mmHg.  Aortic valve peak gradient measures  17.1 mmHg. Aortic valve area, by VTI measures 1.91 cm.   Pulmonic Valve: The pulmonic valve was not well visualized. Pulmonic valve  regurgitation is trivial.   Aorta: The aortic root is normal in size and structure.   Venous: The inferior vena cava is dilated in size with greater than 50%  respiratory variability, suggesting right atrial pressure of 8 mmHg.   IAS/Shunts: The  interatrial septum was not well visualized.   Laboratory Data:  High Sensitivity Troponin:   Recent Labs  Lab 02/03/22 1426 02/03/22 1710 02/28/22 0437 02/28/22 0556  TROPONINIHS 19* 17 16 15     $ Chemistry Recent Labs  Lab 02/28/22 1328 02/28/22 1700 03/01/22 0029 03/02/22 0107  NA 143 144 141 143  K 3.8 4.1 4.1 4.1  CL 109 110 106 107  CO2 24 24 24 25  $ GLUCOSE 99 89 91 94  BUN 68* 65* 65* 64*  CREATININE 3.27* 3.14* 3.08* 2.95*  CALCIUM 8.6* 8.8* 8.7* 8.7*  MG 1.8  --  2.0 2.0  GFRNONAA 15* 16* 17* 17*  ANIONGAP 10 10 11 11    $ Recent Labs  Lab 02/28/22 0437  PROT 6.4*  ALBUMIN 2.7*  AST 18  ALT 10  ALKPHOS 121  BILITOT 0.2*   Lipids No results for input(s): "CHOL", "TRIG", "HDL", "LABVLDL", "LDLCALC", "CHOLHDL" in the last 168 hours.  Hematology Recent Labs  Lab 02/28/22 0437 03/01/22 0029 03/02/22 0107 03/02/22 0745  WBC 11.3* 8.1 8.2  --   RBC 3.22* 2.89* 3.30*  --   HGB 9.2* 8.1* 9.3* 9.0*  HCT 29.5* 25.6* 29.3* 28.2*  MCV 91.6 88.6 88.8  --   MCH 28.6 28.0 28.2  --   MCHC 31.2 31.6 31.7  --   RDW 16.0* 15.7* 15.4  --   PLT 294 295 342  --    Thyroid  Recent Labs  Lab 02/28/22 1347  TSH 0.570    BNP Recent Labs  Lab 02/28/22 0437  BNP 542.7*    DDimer No results for input(s): "DDIMER" in the last 168 hours.   Radiology/Studies:  DG Chest Portable 1 View  Result Date: 02/28/2022 CLINICAL DATA:  Dyspnea. EXAM: PORTABLE CHEST 1 VIEW COMPARISON:  02/03/2022. FINDINGS: The heart is enlarged and the mediastinal contour is within normal limits. There is atherosclerotic calcification of the aorta. No consolidation, effusion, or pneumothorax. Old healed rib fractures are present on the left. IMPRESSION: Cardiomegaly with no active disease. Electronically Signed   By: Mickel Baas  Lovena Le M.D.   On: 02/28/2022 03:58     Assessment and Plan:   Tiffany [name] is a 62 y.o. female with a hx of HFpEF, COPD on O2, hypertension, schizophrenia,  hyperlipidemia, CKD IV, history of cocaine use who is being seen 03/02/2022 for the evaluation of CHF at the request of Dr. McDiarmid.  HFpEF --Presented with increasing lower extremity edema as well as dyspnea.  BNP 542.  Has been diuresing with IV Lasix and has been transitioned to 80 mg PO twice daily.  Weight has trended down from 218>>206lbs. with patient she clearly has dietary indiscretion regarding her sodium intake. -- Discussed reviewing restriction, daily weights.  Will order nutrition consult -- Continue carvedilol 6.25 mg twice daily, seems reasonable to continue on lasix 75m BID at discharge. (Of note was on torsemide 67mdaily PTA) Close follow up in the office for recheck of BMET -- given her CKD, would not add SGLT2i  AKI w/ CKD IV  -- baseline Cr around 2.7-2.9 most recently. Up to 3.36 on admission. Improved to 2.95 currently -- has seen nephrology in the past, but does not appear she follows outpatient. Suspect she would benefit from outpatient visits  Hypertension -- elevated over the past several readings -- continue coreg 6.2555mID, prazosin -- add hydralazine 37m49mD  HLD -- on statin   Systolic murmur -- last echo did not report any significant valvular disease -- will repeat  Per primary COPD schizophrenia   Risk Assessment/Risk Scores:      New York Heart Association (NYHA) Functional Class NYHA Class II    For questions or updates, please contact [facility] consult [email or web address] for contact info under    Signed, LindReino Bellis  03/02/2022 12:38 PM  I have seen and examined the patient along with LindReino Bellis , PA NP.  I have reviewed the chart, notes and new data.  I agree with PA/NP's note.  Key new complaints: Currently denies dyspnea. Key examination changes: Regular rate and rhythm normal heart sounds, early peaking systolic ejection murmur, trivial ankle edema in a symmetrical pattern, no longer rales,  unable to see the jugular veins.  Obese. Key new findings / data: Labs reviewed.  Fraction has been reasonably stable during the last few days of treatment with diuretics, actually with an improving trend.  GFR at baseline appears to be around 20, although throughout the early part of 2023 GFR appears to be in the 35-40 range.  The echocardiogram from January was reviewed.  Much consistent with decompensated heart failure with preserved left ventricular systolic function  PLAN: Furosemide 80 mg twice daily is a reasonable initial plan, but what ever dose of diuretic she is discharged, she will require very early follow-up, since home with dietary habits will likely be very different from the hospital. Very important to impress upon the patient the need for daily weight monitoring, signs and symptoms of heart failure, avoiding sodium rich foods, all of which were discussed with her today. She does not stand. I wonder if there is a way to weigh her in a wheelchair. She does have a daily nurses aide. Includes the use of ARNI/ARB/ACE inhibitors and spironolactone.  Theoretically could use SGLT2 inhibitors but I am worried about potential side effects in this particular patient. I assume presents and was prescribed as an adjunct to her mental health medications, but this medication does overlap and its effects with carvedilol and has actually been shown to be deleterious in  patients with depressed systolic function. Cocaine cessation is critical.  Sanda Klein, MD, [place] [phone] 03/02/2022, 2:34 PM

## 2022-03-02 NOTE — Progress Notes (Signed)
Attempted PIV x 2 with no success. Visible veins, however, very fragile and does not give blood return.

## 2022-03-02 NOTE — Progress Notes (Signed)
     Daily Progress Note Intern Pager: [phone]  Patient name: Tiffany [name] Medical record number: [id number] Date of birth: 1960-03-31 Age: 62 y.o. Gender: female  Primary Care Provider: Leslie Dales, DO Consultants: none Code Status: full code  Pt Overview and Major Events to Date:  2/13: Admitted  Assessment and Plan: Tiffany [name] is a 62 y.o female admitted for CHF exacerbation. PMHx pertinent for HFpEF, CKD, T2DM, COPD, h/o cardiac arrest, HTN, schizophrenia, HLD, hx of cocaine use.  * Acute on chronic diastolic heart failure (HCC) ~6.2 L UOP since admission. 93.7 kg today, down from 99.2kg on admission. Diuresing well w/ Lasix 80 mg PO. Cr improving w/ diuresis. On RA. - Cont lasix 80 mg PO BID - Maintain K>4, Mag >2 during diuresis - Strict I/O - Daily weights  AKI (acute kidney injury) (HCC) Cr downtrending w/ diuresis. - Diuresis as above  COPD (chronic obstructive pulmonary disease) (HCC) On RA. - Cont formulary equivalent of home Trelegy - albuterol prn - Outpatient f/u of lung nodule  Essential hypertension - Cont home prazosin and Coreg  Coronary artery disease involving native coronary artery of native heart without angina pectoris - Continue Coreg - Continue Statin - Continue ASA 81 daily  Schizophrenia, schizo-affective ([place]) - Continue home Saphris and Olanzapine - Continue Lexapro and Cymbalta - UDS positive for cocaine  Physical deconditioning PT/OT recommending SNF. Need to discuss with patient. - PT/OT  Normocytic anemia Stable and chronic. Noted to have some blood in stool overnight- suspect hemorrhoid. Recheck H&H. - AM CBC  Neck pain - Lidocaine patch  - Heating pad prn - cont Cymbalta    FEN/GI: Heart healthy, carb modified  PPx: Lovenox Dispo: SNF pending clinical improvement and discussion with patient  Subjective:  Reports improvement in breathing and leg swelling.  Objective: Temp:  [98.2 F (36.8 C)-99.2 F  (37.3 C)] 98.2 F (36.8 C) (02/15 0248) Pulse Rate:  [59-67] 65 (02/15 0800) Resp:  [15-20] 20 (02/15 0800) BP: (126-163)/(75-79) 163/79 (02/15 0700) SpO2:  [94 %-98 %] 98 % (02/15 0800) Weight:  [93.7 kg] 93.7 kg (02/15 0124) Physical Exam: General: NAD, chronically ill appearing Cardiovascular: RRR, no murmurs Respiratory: CTAB, normal wob on RA Abdomen: Soft, not tender, not distended. BS + Extremities: Moves all 4 extremities. Trace, non pitting edema in bilateral LE  Laboratory: Most recent CBC Lab Results  Component Value Date   WBC 8.2 03/02/2022   HGB 9.0 (L) 03/02/2022   HCT 28.2 (L) 03/02/2022   MCV 88.8 03/02/2022   PLT 342 03/02/2022   Most recent BMP    Latest Ref Rng & Units 03/02/2022    1:07 AM  BMP  Glucose 70 - 99 mg/dL 94   BUN 8 - 23 mg/dL 64   Creatinine 0.44 - 1.00 mg/dL 2.95   Sodium 135 - 145 mmol/L 143   Potassium 3.5 - 5.1 mmol/L 4.1   Chloride 98 - 111 mmol/L 107   CO2 22 - 32 mmol/L 25   Calcium 8.9 - 10.3 mg/dL 8.7    Leslie Dales, DO 03/02/2022, 8:55 AM  PGY-1, [place] Intern pager: [phone], text pages welcome Secure chat group [facility] Teaching Service

## 2022-03-02 NOTE — Progress Notes (Signed)
FMTS Interim Progress Note  S:Visited patient for night rounds with Dr. Rock Nephew. Patient states that she had mild abdominal pain earlier, but does not have any now. She denies further rectal bleeding.   O: BP 119/69 (BP Location: Right Arm)   Pulse 63   Temp 98.3 F (36.8 C) (Oral)   Resp 17   Ht 5' 4"$  (1.626 m)   Wt 93.7 kg   SpO2 95%   BMI 35.46 kg/m   General: well appearing, in no acute distress CV: RRR, radial pulses equal and palpable, no BLE edema  Resp: Normal work of breathing on room air Abd: Soft, non tender, non distended  Neuro: Alert & Oriented  GU: (Chaperoned by NT and Dr. Rock Nephew) no rectal bleeding   A/P: Rectal bleeding  Patient last had rectal bleeding episode around 4:30 pm. Patient had stable vitals at that time, but had at least a cup of bloody stool.  Patient had difficult IV access. IV team was called and unsuccessful as well. Nephrology advised against midline due to possible need for dialysis in the future. CCM agreed to place central line.  Called GI regarding rectal bleed. Patient had noted divertula on past colonoscopy. GI says most likely diverticular as opposed to upper GI bleed. Recommended CTA if patient has further bleeding and/or abdominal pain as there could also be concern for ischemia.  - Will give 1 u pRBC once central line placed  - Continue to monitor BP  - Will call GI if patient has further bleeding episodes or becomes unstable  - CTA in the case of more episodes   Lowry Ram, MD 03/02/2022, 8:06 PM PGY-1, [place] Service pager [phone]

## 2022-03-02 NOTE — TOC Transition Note (Addendum)
Transition of Care [facility]) - CM/SW Discharge Note   Patient Details  Name: Tiffany [name] MRN: [id number] Date of Birth: 12/09/1960  [facility] CM/SW Contact:  Zenon Mayo, RN Phone Number: 03/02/2022, 3:12 PM   Clinical Narrative:    Patient is for dc today, NCM informed by [name] paitent refusing SNF, NCM offered choice, she chose [facility] , NCM made referral to [facility], he is able to take referral.  Soc will begin 24 to 48 hrs post dc.  Patient states she has transport home, she will call her ride now    Final next level of care: [place] Barriers to Discharge: No Barriers Identified   Patient Goals and CMS Choice CMS Medicare.gov Compare Post Acute Care list provided to:: Patient Choice offered to / list presented to : Patient  Discharge Placement                         Discharge Plan and Services Additional resources added to the After Visit Summary for                  DME Arranged: N/A DME Agency: NA       HH Arranged: RN, Disease Management, PT [place] Agency: [facility] Date [facility] Agency Contacted: 03/02/22 Time [place]: [id number] Representative spoke with at [place]: [place] Determinants of Health ([place]) Interventions SDOH Screenings   Food Insecurity: Unknown (02/05/2022)  Housing: Medium Risk (02/07/2022)  Transportation Needs: Unmet Transportation Needs (02/07/2022)  Utilities: Unknown (02/05/2022)  Alcohol Screen: Low Risk  (07/24/2021)  Depression (PHQ2-9): High Risk (02/27/2022)  Financial Resource Strain: Medium Risk (07/24/2021)  Physical Activity: Inactive (07/24/2021)  Social Connections: Moderately Isolated (07/24/2021)  Stress: Stress Concern Present (07/24/2021)  Tobacco Use: Medium Risk (02/28/2022)     Readmission Risk Interventions    02/07/2022    2:22 PM 05/24/2021    6:19 PM 07/26/2020    2:21 PM  Readmission Risk Prevention Plan  Transportation Screening Complete Complete Complete   Medication Review Press photographer) Complete Referral to Pharmacy Complete  PCP or Specialist appointment within 3-5 days of discharge Complete Complete Complete  HRI or Home Care Consult Complete Complete Complete  SW Recovery Care/Counseling Consult Complete Complete Complete  Palliative Care Screening Not Applicable  Not Applicable  [place] Not Applicable Complete Complete

## 2022-03-02 NOTE — Significant Event (Signed)
Mid shift report received.Discharge was placed. Patient had an Bowel movement with Large clots Md Notified and was able to reassess. Discharge cancelled, Labs ordered and GI will be consulted in the am

## 2022-03-02 NOTE — Progress Notes (Signed)
Patient with one normal formed soft large greenish/yellowish stool. No bleeding noted at this time.

## 2022-03-02 NOTE — Significant Event (Signed)
Patient noted with rectal bleeding with clots after passing a large formed stool. She denied any pain, she shared that that she does have hx of hemroids and rectal bleeding in the past but unsure of intervention. Attending MD paged. MD is en route.

## 2022-03-03 ENCOUNTER — Inpatient Hospital Stay: Payer: Self-pay

## 2022-03-03 DIAGNOSIS — I5033 Acute on chronic diastolic (congestive) heart failure: Secondary | ICD-10-CM | POA: Diagnosis not present

## 2022-03-03 DIAGNOSIS — J449 Chronic obstructive pulmonary disease, unspecified: Secondary | ICD-10-CM | POA: Diagnosis not present

## 2022-03-03 DIAGNOSIS — Z7401 Bed confinement status: Secondary | ICD-10-CM | POA: Diagnosis not present

## 2022-03-03 DIAGNOSIS — K5791 Diverticulosis of intestine, part unspecified, without perforation or abscess with bleeding: Secondary | ICD-10-CM

## 2022-03-03 DIAGNOSIS — M109 Gout, unspecified: Secondary | ICD-10-CM | POA: Diagnosis present

## 2022-03-03 DIAGNOSIS — Z8673 Personal history of transient ischemic attack (TIA), and cerebral infarction without residual deficits: Secondary | ICD-10-CM | POA: Diagnosis not present

## 2022-03-03 DIAGNOSIS — R911 Solitary pulmonary nodule: Secondary | ICD-10-CM | POA: Diagnosis present

## 2022-03-03 DIAGNOSIS — K922 Gastrointestinal hemorrhage, unspecified: Secondary | ICD-10-CM | POA: Diagnosis not present

## 2022-03-03 DIAGNOSIS — E1140 Type 2 diabetes mellitus with diabetic neuropathy, unspecified: Secondary | ICD-10-CM | POA: Diagnosis present

## 2022-03-03 DIAGNOSIS — F250 Schizoaffective disorder, bipolar type: Secondary | ICD-10-CM | POA: Diagnosis present

## 2022-03-03 DIAGNOSIS — Z8674 Personal history of sudden cardiac arrest: Secondary | ICD-10-CM | POA: Diagnosis not present

## 2022-03-03 DIAGNOSIS — E1122 Type 2 diabetes mellitus with diabetic chronic kidney disease: Secondary | ICD-10-CM | POA: Diagnosis present

## 2022-03-03 DIAGNOSIS — I509 Heart failure, unspecified: Secondary | ICD-10-CM | POA: Diagnosis present

## 2022-03-03 DIAGNOSIS — N179 Acute kidney failure, unspecified: Secondary | ICD-10-CM | POA: Diagnosis present

## 2022-03-03 DIAGNOSIS — R0902 Hypoxemia: Secondary | ICD-10-CM | POA: Diagnosis not present

## 2022-03-03 DIAGNOSIS — D500 Iron deficiency anemia secondary to blood loss (chronic): Secondary | ICD-10-CM | POA: Diagnosis not present

## 2022-03-03 DIAGNOSIS — J439 Emphysema, unspecified: Secondary | ICD-10-CM | POA: Diagnosis present

## 2022-03-03 DIAGNOSIS — E785 Hyperlipidemia, unspecified: Secondary | ICD-10-CM | POA: Diagnosis present

## 2022-03-03 DIAGNOSIS — D72829 Elevated white blood cell count, unspecified: Secondary | ICD-10-CM | POA: Diagnosis present

## 2022-03-03 DIAGNOSIS — R0603 Acute respiratory distress: Secondary | ICD-10-CM | POA: Diagnosis present

## 2022-03-03 DIAGNOSIS — K5731 Diverticulosis of large intestine without perforation or abscess with bleeding: Secondary | ICD-10-CM

## 2022-03-03 DIAGNOSIS — F1410 Cocaine abuse, uncomplicated: Secondary | ICD-10-CM | POA: Diagnosis present

## 2022-03-03 DIAGNOSIS — G894 Chronic pain syndrome: Secondary | ICD-10-CM | POA: Diagnosis present

## 2022-03-03 DIAGNOSIS — K625 Hemorrhage of anus and rectum: Secondary | ICD-10-CM | POA: Diagnosis not present

## 2022-03-03 DIAGNOSIS — I130 Hypertensive heart and chronic kidney disease with heart failure and stage 1 through stage 4 chronic kidney disease, or unspecified chronic kidney disease: Secondary | ICD-10-CM | POA: Diagnosis present

## 2022-03-03 DIAGNOSIS — E669 Obesity, unspecified: Secondary | ICD-10-CM | POA: Diagnosis present

## 2022-03-03 DIAGNOSIS — R531 Weakness: Secondary | ICD-10-CM | POA: Diagnosis not present

## 2022-03-03 DIAGNOSIS — Z1152 Encounter for screening for COVID-19: Secondary | ICD-10-CM | POA: Diagnosis not present

## 2022-03-03 DIAGNOSIS — D631 Anemia in chronic kidney disease: Secondary | ICD-10-CM | POA: Diagnosis present

## 2022-03-03 DIAGNOSIS — Z743 Need for continuous supervision: Secondary | ICD-10-CM | POA: Diagnosis not present

## 2022-03-03 DIAGNOSIS — F32A Depression, unspecified: Secondary | ICD-10-CM | POA: Diagnosis present

## 2022-03-03 DIAGNOSIS — N184 Chronic kidney disease, stage 4 (severe): Secondary | ICD-10-CM | POA: Diagnosis present

## 2022-03-03 DIAGNOSIS — I2510 Atherosclerotic heart disease of native coronary artery without angina pectoris: Secondary | ICD-10-CM | POA: Diagnosis present

## 2022-03-03 HISTORY — DX: Heart failure, unspecified: I50.9

## 2022-03-03 LAB — CBC
HCT: 25.7 % — ABNORMAL LOW (ref 36.0–46.0)
HCT: 24.8 % — ABNORMAL LOW (ref 36.0–46.0)
Hemoglobin: 8.5 g/dL — ABNORMAL LOW (ref 12.0–15.0)
Hemoglobin: 8 g/dL — ABNORMAL LOW (ref 12.0–15.0)
MCH: 28.8 pg (ref 26.0–34.0)
MCH: 28.8 pg (ref 26.0–34.0)
MCHC: 33.1 g/dL (ref 30.0–36.0)
MCHC: 32.3 g/dL (ref 30.0–36.0)
MCV: 87.1 fL (ref 80.0–100.0)
MCV: 89.2 fL (ref 80.0–100.0)
Platelets: 296 10*3/uL (ref 150–400)
Platelets: 295 10*3/uL (ref 150–400)
RBC: 2.95 MIL/uL — ABNORMAL LOW (ref 3.87–5.11)
RBC: 2.78 MIL/uL — ABNORMAL LOW (ref 3.87–5.11)
RDW: 15.1 % (ref 11.5–15.5)
RDW: 15.1 % (ref 11.5–15.5)
WBC: 9.1 10*3/uL (ref 4.0–10.5)
WBC: 8.9 10*3/uL (ref 4.0–10.5)
nRBC: 0 % (ref 0.0–0.2)
nRBC: 0 % (ref 0.0–0.2)

## 2022-03-03 LAB — BASIC METABOLIC PANEL
Anion gap: 9 (ref 5–15)
BUN: 59 mg/dL — ABNORMAL HIGH (ref 8–23)
CO2: 29 mmol/L (ref 22–32)
Calcium: 8.9 mg/dL (ref 8.9–10.3)
Chloride: 104 mmol/L (ref 98–111)
Creatinine, Ser: 2.74 mg/dL — ABNORMAL HIGH (ref 0.44–1.00)
GFR, Estimated: 19 mL/min — ABNORMAL LOW (ref >60)
Glucose, Bld: 94 mg/dL (ref 70–99)
Potassium: 3.4 mmol/L — ABNORMAL LOW (ref 3.5–5.1)
Sodium: 142 mmol/L (ref 135–145)

## 2022-03-03 IMAGING — DX DG ANKLE COMPLETE 3+V*R*
3 series · 3 of 3 positions shown · non-contrast
Comparison: Right tibia and fibular radiographs 08/20/2018

CLINICAL DATA: Increasing pain. Remote ORIF.

EXAM:
RIGHT ANKLE - COMPLETE 3+ VIEW

[ankle ap]
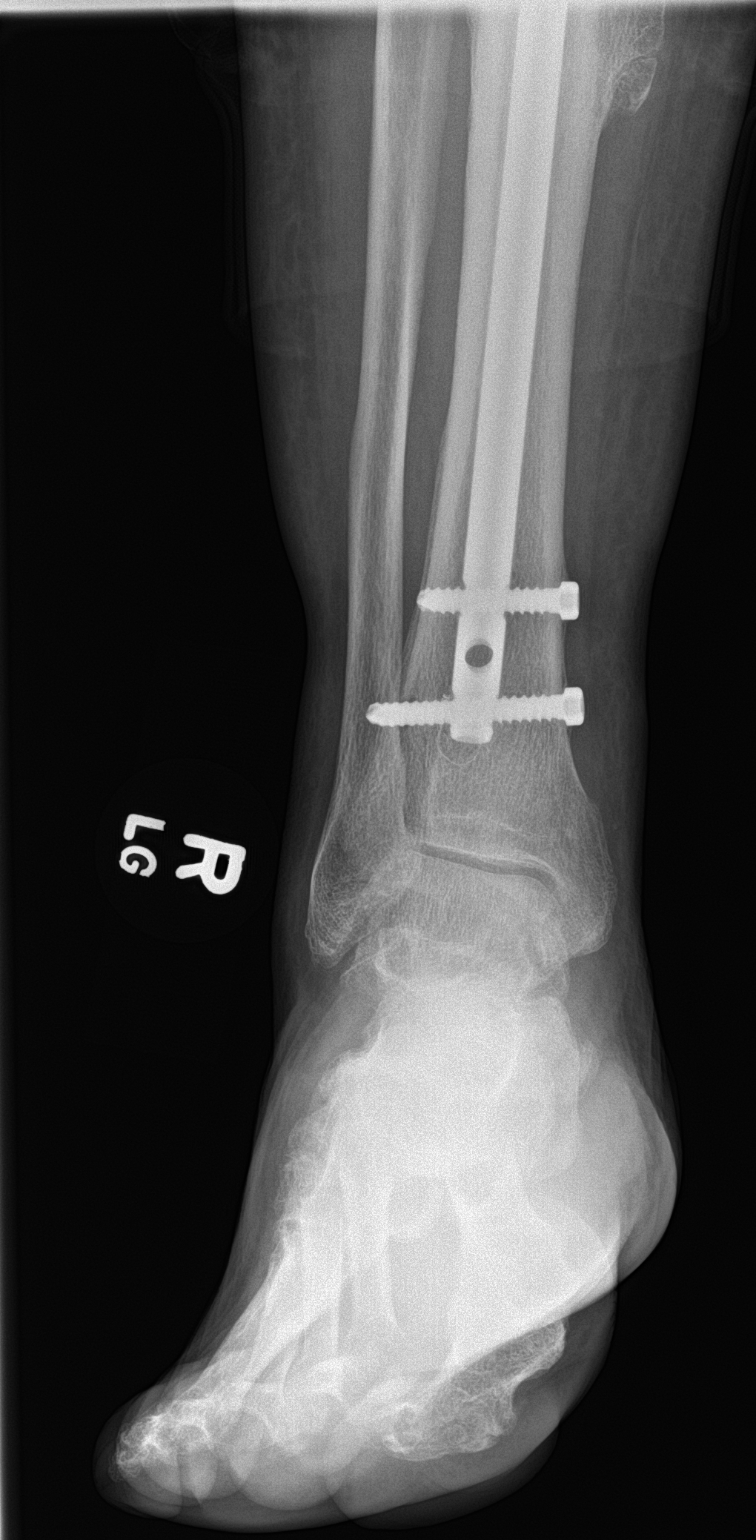

[ankle obl]
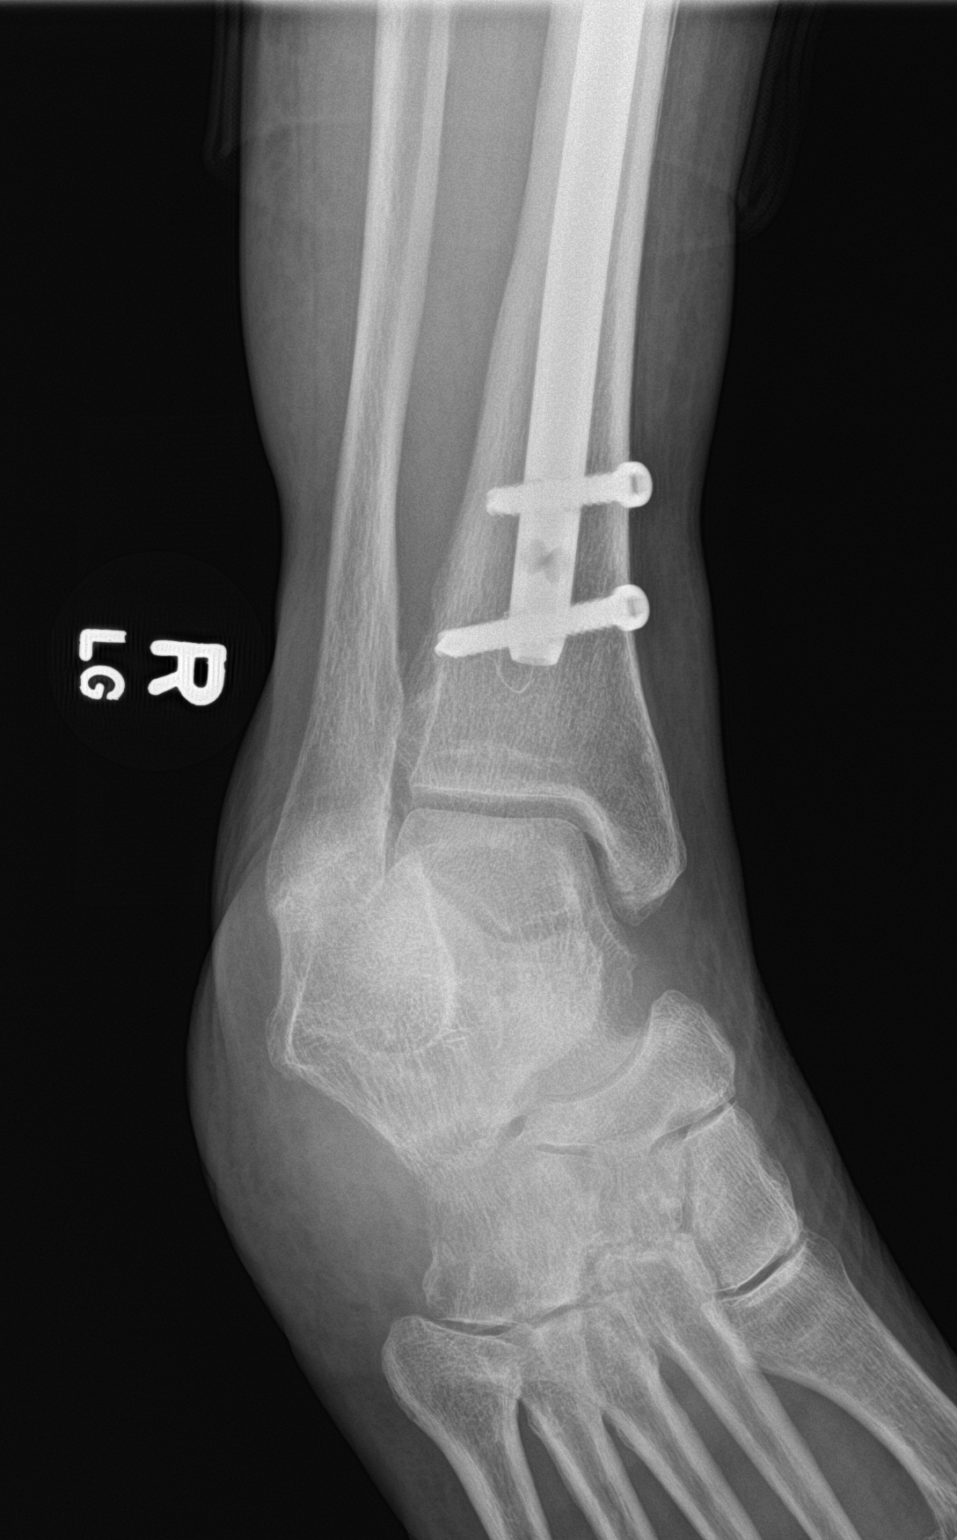

[ankle lat]
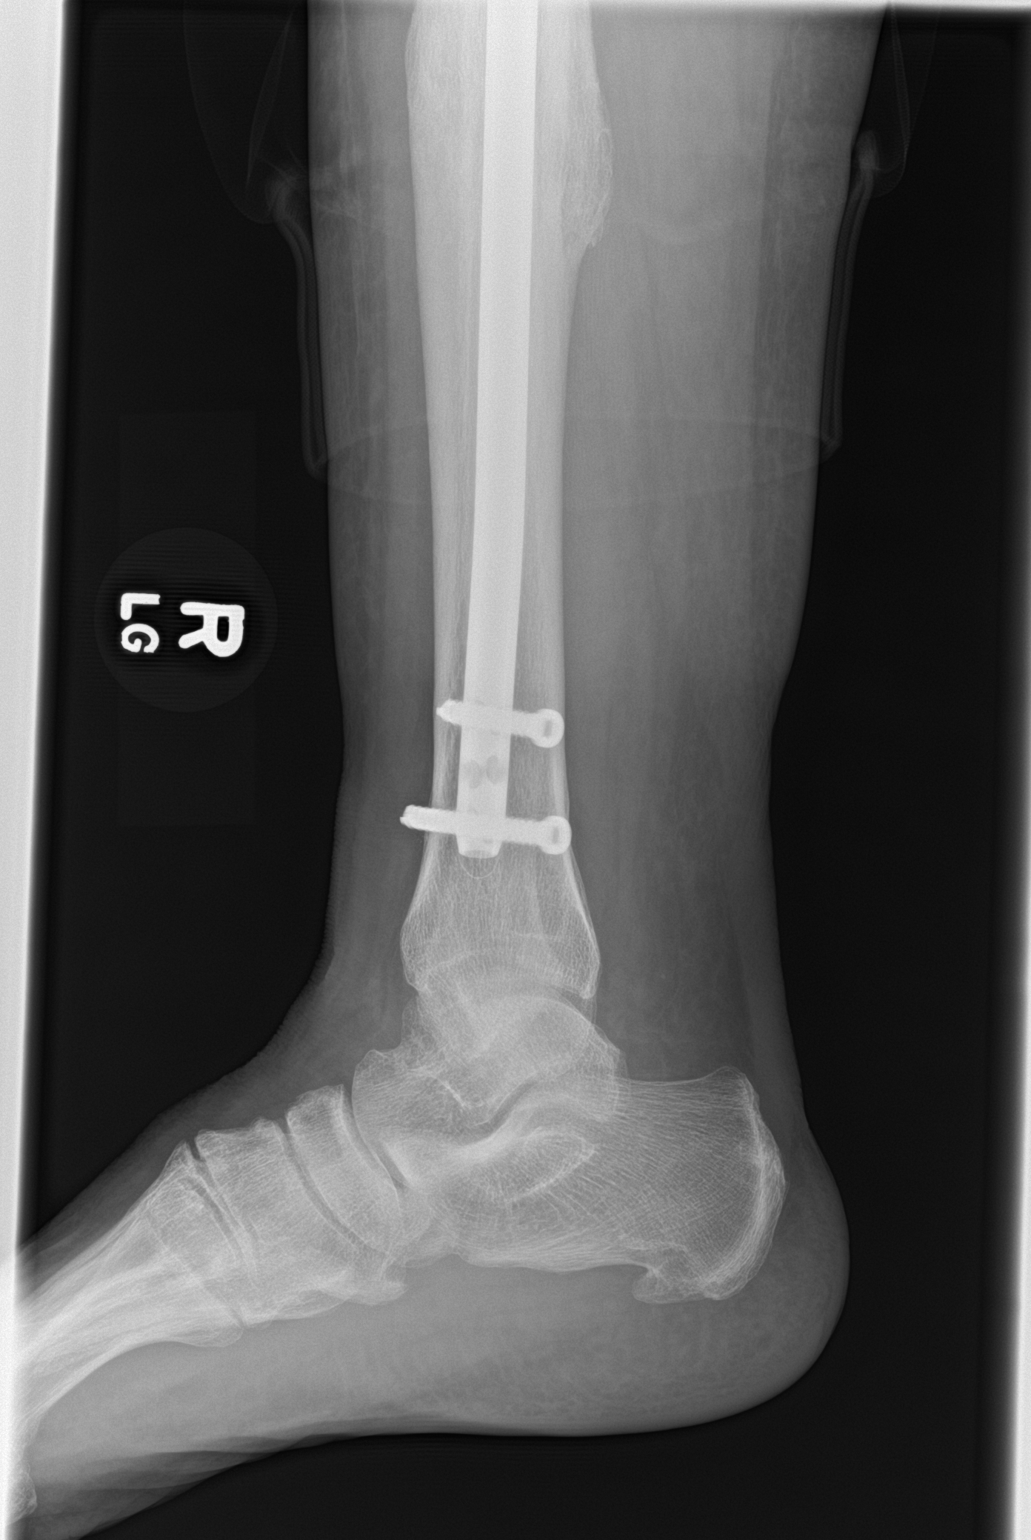

[3 of 3 positions shown; findings below may reference images not displayed]

FINDINGS: Right tibia ORIF is again noted. Healed tibia fracture is noted.
Ankle joint is located. No acute bone or soft tissues abnormalities
are present. Prominent calcaneal spur is present. Degenerative
changes are present in the midfoot.
IMPRESSION: 1. No acute abnormality or significant interval change.
2. Status post ORIF of the tibia.

## 2022-03-03 MED ORDER — POTASSIUM CHLORIDE CRYS ER 20 MEQ PO TBCR
40.0000 meq | EXTENDED_RELEASE_TABLET | Freq: Once | ORAL | Status: AC
  Administered 2022-03-03: 40 meq via ORAL
  Filled 2022-03-03: qty 2

## 2022-03-03 MED ORDER — POTASSIUM CHLORIDE CRYS ER 20 MEQ PO TBCR: 20.0000 meq | EXTENDED_RELEASE_TABLET | Freq: Once | ORAL | Status: DC

## 2022-03-03 NOTE — Plan of Care (Signed)

## 2022-03-03 NOTE — Progress Notes (Addendum)
Daily Progress Note Intern Pager: [phone]  Patient name: Tiffany [name] Medical record number: [id number] Date of birth: 06-11-1960 Age: 62 y.o. Gender: female  Primary Care Provider: Leslie Dales, DO Consultants: Cardiology, GI Code Status: Full code  Pt Overview and Major Events to Date:  2/13: Admitted 2/15: Transfused 1 PRBC for GI bleeding  Assessment and Plan: Tiffany [name] is a 62 y.o female admitted for CHF exacerbation. PMHx pertinent for HFpEF, CKD, T2DM, COPD, h/o cardiac arrest, HTN, schizophrenia, HLD, hx of cocaine use.  * Acute on chronic diastolic heart failure ([place]) ~8.8  L UOP since admission. 91.6 kg today, down from 99.2kg on admission. Diuresing well w/ Lasix 80 mg PO. Cr improving w/ diuresis. On RA. Per cardiology, she will need close follow-up after d/c to reassess diuresis plan.  - Cont lasix 80 mg PO BID - Maintain K>4, Mag >2 during diuresis - Strict I/O - Daily weights  GI bleed Hgb 8.5 this morning. Noted to have large blood clots in stool prior to DC yesterday, with a hgb of 7.1. Consented for blood and received 1 unit pRBC. History of diverticular bleed in 03/2021 requiring transfusion. GI consulted, Recommended CTA if patient has further bleeding and/or abdominal pain as there could also be concern for ischemia. Placed NPO, pending GI evaluation. Suspect this is lower GI bleed, Protonix started out of caution for upper GI bleed. - GI consulted, appreciate recs - IV Protonix - N.p.o. - S/p 1 unit PRBC  Acute kidney injury superimposed on chronic kidney disease ([place]) Cr downtrending w/ diuresis. - Diuresis as above  COPD (chronic obstructive pulmonary disease) (HCC) On RA. - Cont formulary equivalent of home Trelegy - albuterol prn - Outpatient f/u of lung nodule  Essential hypertension Per cardiology note, appears there is concern with use of prazosin in patient with reduced systolic function. Will consider d/c of this  medication. - Cont home prazosin and Coreg  Coronary artery disease involving native coronary artery of native heart without angina pectoris - Continue Coreg - Continue Statin - Continue ASA 81 daily  Schizophrenia, schizo-affective (HCC) - Continue home Saphris and Olanzapine - Continue Lexapro and Cymbalta - UDS positive for cocaine  Physical deconditioning PT/OT recommending SNF, patient has refused SNF to multiple providers. There is concern over safe discharge as patient is unable to ambulate from bed to chair without assistance. HHPT was ordered.  - PT/OT  Neck pain - Lidocaine patch  - Heating pad prn - cont Cymbalta    FEN/GI: NPO until GI eval PPx: none, active bleed Dispo: Pending GI eval   Subjective:  Patient was excited to show interviewer that she can touch her toes, which she has not been able to do in months 2/2 volume overload. She reports feeling overall improved, but interested in seeing GI. Additionally she would like psych OP visit after discharge- primary team will provide resources.  Objective: Temp:  [98.3 F (36.8 C)-99.4 F (37.4 C)] 98.6 F (37 C) (02/16 0803) Pulse Rate:  [59-72] 66 (02/16 0803) Resp:  [13-22] 17 (02/16 0803) BP: (109-172)/(65-113) 156/76 (02/16 0803) SpO2:  [91 %-98 %] 96 % (02/16 0826) Weight:  [91.6 kg] 91.6 kg (02/16 0519) Physical Exam: General: NAD, chronically ill appearing, resting comfortably in bed Cardiovascular: RRR, no murmurs Respiratory: CTAB, normal WOB on RA Abdomen: Soft, not tender, not distended. BS + Extremities: Trace non-pitting edema, warm and dry  Laboratory: Most recent CBC Lab Results  Component Value Date  WBC 9.1 03/03/2022   HGB 8.5 (L) 03/03/2022   HCT 25.7 (L) 03/03/2022   MCV 87.1 03/03/2022   PLT 296 03/03/2022   Most recent BMP    Latest Ref Rng & Units 03/03/2022    5:32 AM  BMP  Glucose 70 - 99 mg/dL 94   BUN 8 - 23 mg/dL 59   Creatinine 0.44 - 1.00 mg/dL 2.74   Sodium  135 - 145 mmol/L 142   Potassium 3.5 - 5.1 mmol/L 3.4   Chloride 98 - 111 mmol/L 104   CO2 22 - 32 mmol/L 29   Calcium 8.9 - 10.3 mg/dL 8.9     Leslie Dales, DO 03/03/2022, 9:47 AM  PGY-1, [place] Intern pager: [phone], text pages welcome Secure chat group [place]

## 2022-03-03 NOTE — Assessment & Plan Note (Signed)
Hgb 8.5 this morning. Noted to have large blood clots in stool prior to DC yesterday, with a hgb of 7.1. Consented for blood and received 1 unit pRBC. History of diverticular bleed in 03/2021 requiring transfusion. GI consulted, Recommended CTA if patient has further bleeding and/or abdominal pain as there could also be concern for ischemia. Placed NPO, pending GI evaluation. Suspect this is lower GI bleed, Protonix started out of caution for upper GI bleed. - GI consulted, appreciate recs - IV Protonix - N.p.o. - S/p 1 unit PRBC

## 2022-03-03 NOTE — Progress Notes (Signed)
Rounding Note    Patient Name: Tiffany [name] Date of Encounter: 03/03/2022  [facility] Cardiologist: Tiffany Him, MD   Subjective   She developed rectal bleeding yesterday before discharge.  Hemoglobin dropped from 9.3 to 6.6.  Received blood transfusion to hemoglobin of 8.5 this morning. No signs of active bleeding in the last few hours. No overt heart failure exacerbation.  [name] not orthopneic.   Weight today is down another 4.5 pounds since yesterday, roughly 17 pounds less than on admission.  This roughly matches estimated net diuresis of 9.4 L based on in and out records. Renal parameters are actually little better with creatinine down to 2.74 which matches her average creatinine throughout January 2024, but is higher than what appears to be 2023 baseline of 1.4-1.6.  Inpatient Medications    Scheduled Meds:  asenapine  5 mg Sublingual BID   atorvastatin  20 mg Oral Daily   carvedilol  6.25 mg Oral BID WC   Chlorhexidine Gluconate Cloth  6 each Topical Daily   DULoxetine  30 mg Oral Daily   escitalopram  20 mg Oral Daily   fluticasone furoate-vilanterol  1 puff Inhalation Daily   And   umeclidinium bromide  1 puff Inhalation Daily   furosemide  80 mg Oral BID   hydrALAZINE  25 mg Oral Q8H   lidocaine  1 patch Transdermal Q24H   OLANZapine  5 mg Oral Daily   pantoprazole (PROTONIX) IV  40 mg Intravenous Q12H   prazosin  2 mg Oral QHS   pregabalin  100 mg Oral TID   sodium chloride flush  3 mL Intravenous Q12H   Continuous Infusions:  sodium chloride     PRN Meds: sodium chloride, acetaminophen, sodium chloride flush   Vital Signs    Vitals:   03/03/22 0400 03/03/22 0519 03/03/22 0803 03/03/22 0826  BP: (!) 157/113 (!) 109/97 (!) 156/76   Pulse: 67 62 66   Resp: (!) 21 (!) 22 17   Temp:  98.7 F (37.1 C) 98.6 F (37 C)   TempSrc:  Oral Oral   SpO2: 94% 98% 98% 96%  Weight:  91.6 kg    Height:        Intake/Output Summary (Last 24  hours) at 03/03/2022 0834 Last data filed at 03/03/2022 0810 Gross per 24 hour  Intake 1130 ml  Output 4150 ml  Net -3020 ml      03/03/2022    5:19 AM 03/02/2022    1:24 AM 03/01/2022    5:00 AM  Last 3 Weights  Weight (lbs) 201 lb 15.1 oz 206 lb 9.6 oz 207 lb 7.3 oz  Weight (kg) 91.6 kg 93.713 kg 94.1 kg      Telemetry    NSR - Personally Reviewed  ECG    No new tracing - Personally Reviewed  Physical Exam  Appeasr comfortable at 30 deg HOB elevation GEN: No acute distress.   Neck: No JVD Cardiac: RRR, no murmurs, rubs, or gallops.  Respiratory: Clear to auscultation bilaterally. GI: Soft, nontender, non-distended  MS: No edema; No deformity. Neuro:  Nonfocal  Psych: Normal affect   Labs    High Sensitivity Troponin:   Recent Labs  Lab 02/03/22 1426 02/03/22 1710 02/28/22 0437 02/28/22 0556  TROPONINIHS 19* 17 16 15     $ Chemistry Recent Labs  Lab 02/28/22 0437 02/28/22 1328 02/28/22 1700 03/01/22 0029 03/02/22 0107 03/03/22 0532  NA 143 143   < > 141 143  142  K 4.4 3.8   < > 4.1 4.1 3.4*  CL 108 109   < > 106 107 104  CO2 20* 24   < > 24 25 29  $ GLUCOSE 82 99   < > 91 94 94  BUN 68* 68*   < > 65* 64* 59*  CREATININE 3.36* 3.27*   < > 3.08* 2.95* 2.74*  CALCIUM 9.2 8.6*   < > 8.7* 8.7* 8.9  MG 2.0 1.8  --  2.0 2.0  --   PROT 6.4*  --   --   --   --   --   ALBUMIN 2.7*  --   --   --   --   --   AST 18  --   --   --   --   --   ALT 10  --   --   --   --   --   ALKPHOS 121  --   --   --   --   --   BILITOT 0.2*  --   --   --   --   --   GFRNONAA 15* 15*   < > 17* 17* 19*  ANIONGAP 15 10   < > 11 11 9   $ < > = values in this interval not displayed.    Lipids No results for input(s): "CHOL", "TRIG", "HDL", "LABVLDL", "LDLCALC", "CHOLHDL" in the last 168 hours.  Hematology Recent Labs  Lab 03/02/22 1754 03/02/22 2302 03/03/22 0532  WBC 8.1 8.0 9.1  RBC 2.51* 2.24* 2.95*  HGB 7.1* 6.6* 8.5*  HCT 22.4* 19.9* 25.7*  MCV 89.2 88.8 87.1  MCH 28.3  29.5 28.8  MCHC 31.7 33.2 33.1  RDW 15.3 15.4 15.1  PLT 271 270 296   Thyroid  Recent Labs  Lab 02/28/22 1347  TSH 0.570    BNP Recent Labs  Lab 02/28/22 0437  BNP 542.7*    DDimer No results for input(s): "DDIMER" in the last 168 hours.   Radiology    DG CHEST PORT 1 VIEW  Result Date: 03/02/2022 CLINICAL DATA:  Central line placement. EXAM: PORTABLE CHEST 1 VIEW COMPARISON:  02/28/2022 FINDINGS: Stable enlarged cardiac silhouette. Interval right jugular catheter with its tip in the inferior aspect of the superior vena cava. No pneumothorax. Clear lungs. Thoracic spine degenerative changes. Old, healed left rib fractures. IMPRESSION: 1. Right jugular catheter tip in the inferior aspect of the superior vena cava without pneumothorax. 2. Stable cardiomegaly. Electronically Signed   By: Tiffany [name] M.D.   On: 03/02/2022 23:12    Cardiac Studies     Expand All Collapse All      Cardiology Consultation    Patient ID: Tiffany [name] MRN: [id number]; DOB: Jul 11, 1960   Admit date: 02/28/2022 Date of Consult: 03/02/2022   PCP:  Tiffany [name], [facility] Cardiologist:  Tiffany Him, MD      Patient Profile:    Tiffany [name] is a 62 y.o. Tiffany with a hx of HFpEF, COPD on O2, hypertension, schizophrenia, hyperlipidemia, CKD, history of cocaine use who is being seen 03/02/2022 for the evaluation of CHF at the request of Tiffany [name].   History of Present Illness:    Tiffany [name] is a 62 yo Tiffany with PMH noted above. She has been followed by Tiffany [name] as outpatient.  She underwent a right and  left heart catheterization in 2019 which showed normal coronaries, mild PAH and LVEF was noted at 60 to 65% on echo.    She was last seen in the office on 05/2020 and reported increasing lower extremity edema.  She had been recently seen in the internal medicine clinic and prescribed Lasix 20 mg daily as well as compression hose.  It was recommended  that she continue this treatment and if no improvement would need outpatient echocardiogram.    Echo 01/2022 showed LVEF of 60 to 65%, no regional wall motion abnormality, grade 2 diastolic dysfunction, normal RV size and function, severely dilated left atrium, mildly dilated right atrium, trivial MR.    She presented to the ED on 2/13 with complaints of generalized body aches, increased lower extremity edema after being seen at PCP office that morning.  Admission labs showed sodium 143, potassium 4.4, creatinine 3.36, BNP 542, high-sensitivity troponin 16>> 15, WBC 11.3, hemoglobin 9.2, TSH 0.5.  EKG shows sinus rhythm, 73 bpm.  Chest x-ray negative.  Admitted to medicine for further management.  She has been diuresed with IV Lasix, net -7.6 L.  Weight is down from 218 lbs>>206lbs. Cardiology asked to evaluate regarding home diuretic dosing.    In talking with the patient she currently lives at home independently with an aide who comes into her house daily.  States that she had been compliant with her medication including her torsemide and felt she was having decent urine output but noticed lower extremity edema as well as orthopnea.  When asked about her eating habits she reports no limits on her sodium restriction.  Frequently eats processed foods as well as canned foods and frozen meals.  Does not weigh herself daily.       Past Medical History:  Diagnosis Date   Agitation 11/22/2017   Anoxic brain injury ([place]) 09/08/2016    C. Arrest due to respiratory failure and COPD exacerbation   Anxiety     Arthritis      "all over" (04/10/2016)   Asthma 10/18/2010   Binge eating disorder     Cardiac arrest ([place]) 09/08/2016    PEA   Carotid artery stenosis      1-39% bilateral by dopplers 11/2016   Chronic diastolic (congestive) heart failure (HCC)     Chronic kidney disease, stage 3 (HCC)     Chronic pain syndrome 06/18/2012   Chronic post-traumatic stress disorder (PTSD) 05/27/2018   Chronic  respiratory failure with hypoxia and hypercapnia ([place]) 06/22/2015    TRILOGY Vent >AVAPA-ES., Vt target 200-400, Max P 30 , PS max 20 , PS min 6-10 , E Max 6, E Min 4, Rate Auto AVAPS Rate 2 (titrate for pt comfort) , bleed O2 at 5l/m continuous flow .    CKD (chronic kidney disease) 02/03/2022   Closed displaced fracture of fifth metacarpal bone 03/21/2018   Cocaine use disorder, severe, in sustained remission ([place]) 99991111   Complication of anesthesia      decreased bp, decreased heart rate   COPD (chronic obstructive pulmonary disease) ([place]) 07/08/2014   Depression     Diabetic neuropathy ([place]) 04/24/2011   Difficulty with speech 01/24/2018   Disorder of nervous system     Drug abuse ([place]) 11/21/2017   Dyslipidemia 04/24/2011   Elevated troponin 04/28/2012   Emphysema     Encephalopathy 11/21/2017   Essential hypertension 03/22/2016   Fibula fracture 07/10/2016   Frequent falls 10/11/2017   GERD (gastroesophageal reflux disease)     Gout  04/11/2017   Heart attack ([place]) 1980s   History of blood transfusion 1994    "couldn't stop bleeding from my period"   History of drug abuse in remission ([place]) 11/28/2015    Quit in 2017   Hyperlipidemia LDL goal <70     Incontinence     Manic depression ([place])     Morbid obesity ([place]) 10/18/2010   Obstructive sleep apnea 10/18/2010   On home oxygen therapy      "6L; 24/7" (04/10/2016)   OSA on CPAP      "wear mask sometimes" (04/10/2016)   Paranoid ([place])      "sometimes; I'm on RX for it" (04/10/2016)   Prolonged Q-T interval on ECG     Rectal bleeding 12/31/2015   Schizoaffective disorder, bipolar type ([place]) 04/05/2018   Seasonal allergies     Seborrheic keratoses 12/31/2013   Seizures ([place])      "don't know what kind; last one was ~ 1 yr ago" (04/10/2016)   Sinus bradycardia     Stroke [facility]) 1980s    denies residual on 04/10/2016   Thrush 09/19/2013   Type 2 diabetes mellitus ([place]) 10/18/2010           Past Surgical History:   Procedure Laterality Date   CESAREAN SECTION   1997   COLONOSCOPY WITH PROPOFOL N/A 04/01/2021    Procedure: COLONOSCOPY WITH PROPOFOL;  Surgeon: Carol Ada, MD;  Location: [facility];  Service: Gastroenterology;  Laterality: N/A;  Rectal bleeding with drop in hemoglobin to 7.2 g/dL   HERNIA REPAIR       IR CHOLANGIOGRAM EXISTING TUBE   07/20/2016   IR PERC CHOLECYSTOSTOMY   05/10/2016   IR RADIOLOGIST EVAL & MGMT   06/08/2016   IR RADIOLOGIST EVAL & MGMT   06/29/2016   IR SINUS/FIST TUBE CHK-NON GI   07/12/2016   RIGHT/LEFT HEART CATH AND CORONARY ANGIOGRAPHY N/A 06/19/2017    Procedure: RIGHT/LEFT HEART CATH AND CORONARY ANGIOGRAPHY;  Surgeon: Jolaine Artist, MD;  Location: [facility];  Service: Cardiovascular;  Laterality: N/A;   TIBIA IM NAIL INSERTION Right 07/12/2016    Procedure: INTRAMEDULLARY (IM) NAIL RIGHT TIBIA;  Surgeon: Leandrew Koyanagi, MD;  Location: [facility];  Service: Orthopedics;  Laterality: Right;   UMBILICAL HERNIA REPAIR   ~ 1963    "that's why I don't have a belly button"   VAGINAL HYSTERECTOMY          Home Medications:         Prior to Admission medications   Medication Sig Start Date End Date Taking? Authorizing Provider  albuterol (VENTOLIN HFA) 108 (90 Base) MCG/ACT inhaler Inhale 2 puffs into the lungs every 6 (six) hours as needed for wheezing or shortness of breath. 01/05/22   Yes Tiffany Dales, DO  atorvastatin (LIPITOR) 20 MG tablet Take 1 tablet (20 mg total) by mouth daily. 06/24/21   Yes [name], MD  carvedilol (COREG) 6.25 MG tablet Take 1 tablet (6.25 mg total) by mouth 2 (two) times daily with a meal. 02/07/22 03/09/22 Yes Lilland, Alana, DO  dicyclomine (BENTYL) 10 MG capsule Take 1 capsule (10 mg total) by mouth 3 (three) times daily before meals. 01/05/22   Yes Tiffany Dales, DO  DULoxetine (CYMBALTA) 30 MG capsule Take 30 mg by mouth daily. 11/22/21   Yes [provider]  escitalopram (LEXAPRO) 20 MG tablet Take 20 mg by  mouth daily.     Yes [provider]  Fluticasone-Umeclidin-Vilant (TRELEGY  ELLIPTA) 100-62.5-25 MCG/ACT AEPB Inhale 1 puff into the lungs daily. 01/05/22   Yes Tiffany Dales, DO  loperamide (IMODIUM) 2 MG capsule Take 2 capsules (4 mg total) by mouth 3 (three) times daily as needed for diarrhea or loose stools. 02/27/22   Yes Hensel, Jamal Collin, MD  OLANZapine (ZYPREXA) 5 MG tablet Take 1 tablet (5 mg total) by mouth daily. 02/27/22   Yes Hensel, Jamal Collin, MD  omeprazole (PRILOSEC) 40 MG capsule Take 1 capsule (40 mg total) by mouth every morning. Patient taking differently: Take 40 mg by mouth in the morning and at bedtime. 06/24/21   Yes [name], MD  potassium chloride SA (KLOR-CON M) 20 MEQ tablet Take 40 mEq by mouth 2 (two) times daily.     Yes [provider]  prazosin (MINIPRESS) 2 MG capsule TAKE 1 CAPSULE(2 MG) BY MOUTH AT BEDTIME Patient taking differently: Take 2 mg by mouth at bedtime. TAKE 1 CAPSULE(2 MG) BY MOUTH AT BEDTIME 02/27/22   Yes Hensel, Jamal Collin, MD  pregabalin (LYRICA) 100 MG capsule Take 1 capsule (100 mg total) by mouth 2 (two) times daily. 02/27/22   Yes Hensel, Jamal Collin, MD  SAPHRIS 5 MG SUBL 24 hr tablet Place 5 mg under the tongue 2 (two) times daily. 01/27/22   Yes [provider]  torsemide (DEMADEX) 20 MG tablet Take 3 tablets (60 mg total) by mouth daily. 02/27/22   Yes Hensel, Jamal Collin, MD  febuxostat (ULORIC) 40 MG tablet Take 1 tablet (40 mg total) by mouth daily. Patient not taking: Reported on 02/24/2022 01/05/22     Tiffany Dales, DO      Inpatient Medications: Scheduled Meds:  asenapine  5 mg Sublingual BID   aspirin EC  81 mg Oral Daily   atorvastatin  20 mg Oral Daily   carvedilol  6.25 mg Oral BID WC   DULoxetine  30 mg Oral Daily   enoxaparin (LOVENOX) injection  30 mg Subcutaneous Q24H   escitalopram  20 mg Oral Daily   fluticasone furoate-vilanterol  1 puff Inhalation Daily    And   umeclidinium bromide  1  puff Inhalation Daily   furosemide  80 mg Oral BID   lidocaine  1 patch Transdermal Q24H   OLANZapine  5 mg Oral Daily   polyethylene glycol  17 g Oral BID   prazosin  2 mg Oral QHS   pregabalin  100 mg Oral TID   sodium chloride flush  3 mL Intravenous Q12H    Continuous Infusions:  sodium chloride      PRN Meds: sodium chloride, acetaminophen, sodium chloride flush   Allergies:         Allergies  Allergen Reactions   Hydrocodone Shortness Of Breath   Latuda [Lurasidone Hcl] Anaphylaxis   Magnesium-Containing Compounds Anaphylaxis and Other (See Comments)      Tolerated Ensure   Prednisone Anaphylaxis, Swelling and Other (See Comments)      Tongue swelling, lip swelling, throat swelling, per pt    Tramadol Anaphylaxis, Swelling and Rash   Codeine Nausea And Vomiting and Rash   Trazodone Other (See Comments)      paranoia   Peanut-Containing Drug Products Hives      Raw peanuts, but can eat peanut butter   Topamax [Topiramate] Other (See Comments)      Increases paranoia   Sulfa Antibiotics Itching   Tape Rash      Social History:   Social History  Socioeconomic History   Marital status: Widowed      Spouse name: Not on file   Number of children: 3   Years of education: 12th   Highest education level: 12th grade  Occupational History   Occupation: disabled      Comment: factory production  Tobacco Use   Smoking status: Former      Packs/day: 1.50      Years: 38.00      Total pack years: 57.00      Types: Cigarettes      Start date: 03/13/1977      Quit date: 04/10/2016      Years since quitting: 5.8      Passive exposure: Past   Smokeless tobacco: Never  Vaping Use   Vaping Use: Never used  Substance and Sexual Activity   Alcohol use: No      Alcohol/week: 0.0 standard drinks of alcohol   Drug use: Not Currently      Types: Cocaine      Comment: 04/10/2016 "last used cocaine back in November 2017"   Sexual activity: Not Currently      Birth  control/protection: Surgical  Other Topics Concern   Not on file  Social History Narrative    Has 1 son, [name]    Lives with son and his boyfriend    Her house has ramps and handrails should she ever needs them.     Her mother lives down the street from her and is a good support person in addition to her son.    She drives herself, has private transportation.     Cocaine free since 02/24/16, smoke free since 04/10/16    Social Determinants of Health        Financial Resource Strain: Medium Risk (07/24/2021)    Overall Financial Resource Strain (CARDIA)     Difficulty of Paying Living Expenses: Somewhat hard  Food Insecurity: Unknown (02/05/2022)    Hunger Vital Sign     Worried About Running Out of Food in the Last Year: Patient refused     [place] in the Last Year: Patient refused  Transportation Needs: Unmet Transportation Needs (02/07/2022)    PRAPARE - Armed forces logistics/support/administrative officer (Medical): Yes     Lack of Transportation (Non-Medical): Yes  Physical Activity: Inactive (07/24/2021)    Exercise Vital Sign     Days of Exercise per Week: 0 days     Minutes of Exercise per Session: 0 min  Stress: Stress Concern Present (07/24/2021)    [place]     Feeling of Stress : To some extent  Social Connections: Moderately Isolated (07/24/2021)    Social Connection and Isolation Panel [NHANES]     Frequency of Communication with Friends and Family: More than three times a week     Frequency of Social Gatherings with Friends and Family: More than three times a week     Attends Religious Services: 1 to 4 times per year     Active Member of [employer] or Organizations: No     Attends Archivist Meetings: Never     Marital Status: Widowed  Intimate Partner Violence: Unknown (02/05/2022)    Humiliation, Afraid, Rape, and Kick questionnaire     Fear of Current or Ex-Partner: Patient refused     Emotionally  Abused: Patient refused     Physically Abused: Patient refused     Sexually Abused: Patient  refused    Family History:          Family History  Problem Relation Age of Onset   Cancer Mother          lung   Depression Mother     Cancer Father          prostate   Depression Sister     Anxiety disorder Sister     Schizophrenia Sister     Bipolar disorder Sister     Depression Sister     Depression Brother     Heart failure Other          cousin      ROS:  Please see the history of present illness.    All other ROS reviewed and negative.      Physical Exam/Data:          Vitals:    03/02/22 0400 03/02/22 0700 03/02/22 0800 03/02/22 0928  BP: (!) 142/75 (!) 163/79      Pulse:   (!) 59 65    Resp:   16 20    Temp:          TempSrc:          SpO2:   97% 98% 99%  Weight:          Height:              Intake/Output Summary (Last 24 hours) at 03/02/2022 1238 Last data filed at 03/02/2022 1100    Gross per 24 hour  Intake 360 ml  Output 2600 ml  Net -2240 ml        03/02/2022    1:24 AM 03/01/2022    5:00 AM 02/28/2022    3:16 AM  Last 3 Weights  Weight (lbs) 206 lb 9.6 oz 207 lb 7.3 oz 218 lb 11.1 oz  Weight (kg) 93.713 kg 94.1 kg 99.2 kg     Body mass index is 35.46 kg/m.  General:  Well nourished, well developed, in no acute distress HEENT: normal Neck: no JVD Vascular: No carotid bruits; Distal pulses 2+ bilaterally Cardiac:  normal S1, S2; RRR; 2/6 systolic murmur  Lungs:  clear to auscultation bilaterally, no wheezing, rhonchi or rales  Abd: soft, nontender, no hepatomegaly  Ext: trace bilateral LE edema Musculoskeletal:  No deformities, BUE and BLE strength normal and equal Skin: warm and dry  Neuro:  CNs 2-12 intact, no focal abnormalities noted Psych:  Normal affect    EKG:  The EKG was personally reviewed and demonstrates:   sinus rhythm, 73 bpm Telemetry:  Telemetry was personally reviewed and demonstrates:  Sinus Rhythm, 15 beat run of NSVT  (2/14)   Relevant CV Studies:   Echo: 02/04/2022   IMPRESSIONS     1. Left ventricular ejection fraction, by estimation, is 60 to 65%. The  left ventricle has normal function. The left ventricle has no regional  wall motion abnormalities. The left ventricular internal cavity size was  mildly dilated. There is mild left  ventricular hypertrophy. Left ventricular diastolic parameters are  consistent with Grade II diastolic dysfunction (pseudonormalization).  Elevated left atrial pressure.   2. Right ventricular systolic function is normal. The right ventricular  size is normal. There is mildly elevated pulmonary artery systolic  pressure. The estimated right ventricular systolic pressure is [id number] mmHg.   3. Left atrial size was severely dilated.   4. Right atrial size was mildly dilated.   5. The mitral valve is normal in structure. Trivial  mitral valve  regurgitation. No evidence of mitral stenosis.   6. The aortic valve is tricuspid. Aortic valve regurgitation is not  visualized. Aortic valve sclerosis is present, with no evidence of aortic  valve stenosis.   7. The inferior vena cava is dilated in size with >50% respiratory  variability, suggesting right atrial pressure of 8 mmHg.       Patient Profile     62 y.o. Tiffany with a hx of HFpEF, COPD on O2, hypertension, schizophrenia, hyperlipidemia, CKD IV, history of cocaine use w HFpEf exacerbation, developed lower GI bleeding yesterday  Assessment & Plan    HFpEF:  presented markedly volume overloaded. Poor compliance with sodium restriction. Close to euvolemia clinically, but now with active GI bleeding requiring transfusion. Preventing readmission will be a big challenge since we cannot easily use her weight to detect volume gain (she cannot stand). If we can identify a way to weigh at home (hospital bed? [facility] nursing with portable device where we can weigh in wheel chair), that would help a great deal. Medical therapy for HF is  very limited. Prazosin (probably indicated for mental health issues) can be deleterious in HF, but may be necessary for psych compensation. Obviously, cocaine should be avoided. AKI on CKD4: precludes the use of ARB/ACEi/ARNI. I think the risk of complications wioth [id number] outweighs the benefits as well. Renal parameters actually show improving trend, not at baseline yet. Acute lower GI bleed     For questions or updates, please contact [place] Please consult [email or web address] for contact info under        Signed, Sanda Klein, MD  03/03/2022, 8:34 AM

## 2022-03-03 NOTE — Progress Notes (Signed)
PT Cancellation Note  Patient Details Name: AZALEA NOLETTE MRN: [id number] DOB: 01/12/1961   Cancelled Treatment:    Reason Eval/Treat Not Completed: Medical issues which prohibited therapy. Pt with large amount of bloody stool yesterday. Hgb 6.6. Received 1 unit PRBC. Hgb 8.5 this AM. Pt currently NPO, awaiting GI procedure. Pt declining participation in therapy, stating "If I start moving, I'm just going to get that blood going again."   Lorriane Shire 03/03/2022, 7:56 AM

## 2022-03-03 NOTE — Plan of Care (Signed)
Nutrition Education Note  RD consulted for nutrition education regarding new onset CHF.  RD provided "Heart Failure Nutrition Therapy" handout from the Academy of Nutrition and Dietetics. Attached to AVS.   Current diet order is Heart Healthy/Carbohydrate Modified, patient is consuming approximately 100% of meals at this time. Labs and medications reviewed. No further nutrition interventions warranted at this time. RD contact information provided. If additional nutrition issues arise, please re-consult RD.   Lockie Pares., RD, LDN, CNSC See AMiON for contact information

## 2022-03-03 NOTE — TOC Progression Note (Signed)
Discharge medications (4) are being stored in the main pharmacy on the ground floor until patient is ready for discharge.

## 2022-03-03 NOTE — Consult Note (Addendum)
Consultation Note   Referring Provider: Family medicine PCP: Tiffany Dales, DO Primary Gastroenterologist: Tiffany [name]        Reason for consultation: GI bleed   Hospital Day: 4  Assessment    # 62 yo female with lower abdominal pain /  blood stool / worsening of chronic anemia. Hgb declined from 9 to 6.6. Hgb improved to 8.5 after 1 u PRBCs. Today stool is rust color. May have been diverticular hemorrhage though it doesn't typically cause abdominal pain and she describes sharp lower abdominal pain.   # Chronic constipation. Takes stool softeners at home.   # Acute on chronic diastolic heart failure.   # AKI on CKD IV. Baseline Cr ~ 2.8, up to 3.36 .   # Lung lesion >CT chest showing focal opacity measuring 1.8 cm in the posterior segment of the right upper lobe which may be infectious or inflammatory. The possibility of underlying neoplasm can not be excluded  # Difficult airway  # COPD.   # Schizophrenia  # Substance abuse. UDS + cocaine  # See below for additional medical history   Plan  Supportive care. If rebleeds can consider CTA but not ideal in given renal function.   HPI   Patient is a very pleasant 62 y.o. year old female with multiple medical problems not limited to chronic anemia, GI bleed / PUD in [place], GERD, diverticulosis. PEA cardiac arrest in setting of COPD exacerbation, HTN, iastolic heart failure. CKDIV, CVA, schizoaffective disorder, substance abuse, DM 2, CVA, OSA. Gout,  hysterectomy.  See PMH for any additional medical problems.  Previous GI History   Tiffany [name] has been seen by [name] as well as Tiffany [name] during previous hospitalizations. She has esophageal dysmotility and in April 2022 she had an abnormal barium swallow ( barium tablet held up) but she didn't want an EGD   She was seen by [facility] in July 2022 for diarrhea . She was seen in May 2023 by Tiffany [name] ( covering for our group) for rectal  bleeding. She underwent colonoscopy with findings of diverticulosis and blood throughout the colon.   Patient is currently admitted with acute on chronic heart failure and AKI on CKD stage IV. Yesterday she had rectal bleeding with clots.  Bleeding was associated with sharp lower abdominal pain. She doesn't recall having abdominal pain when she bled in March.  Bleeding coincided with a drop in hemoglobin from 9 to 6.6.  Today she isn't having any abdominal pain. [name] incontinent of soft rust colored stool  .   Previous GI Evaluation:   Colonoscopy March 2023 for rectal bleeding  -adequate prep after extensive lavage. Diverticulosis.  Blood in the entire colon. Diverticulosis   Recent Labs and Imaging DG CHEST PORT 1 VIEW  Result Date: 03/02/2022 CLINICAL DATA:  Central line placement. EXAM: PORTABLE CHEST 1 VIEW COMPARISON:  02/28/2022 FINDINGS: Stable enlarged cardiac silhouette. Interval right jugular catheter with its tip in the inferior aspect of the superior vena cava. No pneumothorax. Clear lungs. Thoracic spine degenerative changes. Old, healed left rib fractures. IMPRESSION: 1. Right jugular catheter tip in the inferior aspect of the superior vena cava without pneumothorax. 2. Stable cardiomegaly. Electronically Signed   By: Tiffany [name]  M.D.   On: 03/02/2022 23:12   DG Chest Portable 1 View  Result Date: 02/28/2022 CLINICAL DATA:  Dyspnea. EXAM: PORTABLE CHEST 1 VIEW COMPARISON:  02/03/2022. FINDINGS: The heart is enlarged and the mediastinal contour is within normal limits. There is atherosclerotic calcification of the aorta. No consolidation, effusion, or pneumothorax. Old healed rib fractures are present on the left. IMPRESSION: Cardiomegaly with no active disease. Electronically Signed   By: Tiffany [name] M.D.   On: 02/28/2022 03:58   ECHOCARDIOGRAM COMPLETE  Result Date: 02/04/2022    ECHOCARDIOGRAM REPORT   Patient Name:   Tiffany [name] [facility] Date of Exam: 02/04/2022 Medical Rec #:   [id number]       Height:       64.0 in Accession #:    [id number]      Weight:       189.4 lb Date of Birth:  17-Nov-1960       BSA:          1.912 m Patient Age:    75 years        BP:           154/93 mmHg Patient Gender: F               HR:           59 bpm. Exam Location:  Inpatient Procedure: 2D Echo, Cardiac Doppler and Color Doppler Indications:    I50.31 CHF- Acute Diastolic  History:        Patient has prior history of Echocardiogram examinations, most                 recent 07/10/2021. CHF, COPD; Risk Factors:Hypertension, Diabetes                 and Dyslipidemia.  Sonographer:    Alvino Chapel RCS Referring Phys: Tiffany [name]  1. Left ventricular ejection fraction, by estimation, is 60 to 65%. The left ventricle has normal function. The left ventricle has no regional wall motion abnormalities. The left ventricular internal cavity size was mildly dilated. There is mild left ventricular hypertrophy. Left ventricular diastolic parameters are consistent with Grade II diastolic dysfunction (pseudonormalization). Elevated left atrial pressure.  2. Right ventricular systolic function is normal. The right ventricular size is normal. There is mildly elevated pulmonary artery systolic pressure. The estimated right ventricular systolic pressure is [id number] mmHg.  3. Left atrial size was severely dilated.  4. Right atrial size was mildly dilated.  5. The mitral valve is normal in structure. Trivial mitral valve regurgitation. No evidence of mitral stenosis.  6. The aortic valve is tricuspid. Aortic valve regurgitation is not visualized. Aortic valve sclerosis is present, with no evidence of aortic valve stenosis.  7. The inferior vena cava is dilated in size with >50% respiratory variability, suggesting right atrial pressure of 8 mmHg. FINDINGS  Left Ventricle: Left ventricular ejection fraction, by estimation, is 60 to 65%. The left ventricle has normal function. The left ventricle has no regional  wall motion abnormalities. The left ventricular internal cavity size was mildly dilated. There is  mild left ventricular hypertrophy. Left ventricular diastolic parameters are consistent with Grade II diastolic dysfunction (pseudonormalization). Elevated left atrial pressure. Right Ventricle: The right ventricular size is normal. No increase in right ventricular wall thickness. Right ventricular systolic function is normal. There is mildly elevated pulmonary artery systolic pressure. The tricuspid regurgitant velocity is 2.79  m/s, and with an assumed right atrial pressure of 8  mmHg, the estimated right ventricular systolic pressure is [id number] mmHg. Left Atrium: Left atrial size was severely dilated. Right Atrium: Right atrial size was mildly dilated. Pericardium: Trivial pericardial effusion is present. Mitral Valve: The mitral valve is normal in structure. Trivial mitral valve regurgitation. No evidence of mitral valve stenosis. Tricuspid Valve: The tricuspid valve is normal in structure. Tricuspid valve regurgitation is trivial. Aortic Valve: The aortic valve is tricuspid. Aortic valve regurgitation is not visualized. Aortic valve sclerosis is present, with no evidence of aortic valve stenosis. Aortic valve mean gradient measures 9.0 mmHg. Aortic valve peak gradient measures 17.1 mmHg. Aortic valve area, by VTI measures 1.91 cm. Pulmonic Valve: The pulmonic valve was not well visualized. Pulmonic valve regurgitation is trivial. Aorta: The aortic root is normal in size and structure. Venous: The inferior vena cava is dilated in size with greater than 50% respiratory variability, suggesting right atrial pressure of 8 mmHg. IAS/Shunts: The interatrial septum was not well visualized.  LEFT VENTRICLE PLAX 2D LVIDd:         5.60 cm   Diastology LVIDs:         3.40 cm   LV e' medial:    6.74 cm/s LV PW:         1.10 cm   LV E/e' medial:  17.5 LV IVS:        1.00 cm   LV e' lateral:   5.77 cm/s LVOT diam:     1.90 cm   LV  E/e' lateral: 20.5 LV SV:         92 LV SV Index:   48 LVOT Area:     2.84 cm  RIGHT VENTRICLE RV S prime:     14.80 cm/s TAPSE (M-mode): 3.0 cm LEFT ATRIUM              Index        RIGHT ATRIUM           Index LA diam:        4.40 cm  2.30 cm/m   RA Area:     20.60 cm LA Vol (A2C):   112.0 ml 58.59 ml/m  RA Volume:   62.30 ml  32.59 ml/m LA Vol (A4C):   103.0 ml 53.88 ml/m LA Biplane Vol: 111.0 ml 58.07 ml/m  AORTIC VALVE AV Area (Vmax):    2.08 cm AV Area (Vmean):   1.71 cm AV Area (VTI):     1.91 cm AV Vmax:           207.00 cm/s AV Vmean:          139.000 cm/s AV VTI:            0.483 m AV Peak Grad:      17.1 mmHg AV Mean Grad:      9.0 mmHg LVOT Vmax:         152.00 cm/s LVOT Vmean:        83.900 cm/s LVOT VTI:          0.326 m LVOT/AV VTI ratio: 0.67  AORTA Ao Root diam: 3.00 cm MITRAL VALVE                TRICUSPID VALVE MV Area (PHT): 3.72 cm     TR Peak grad:   31.1 mmHg MV Decel Time: 204 msec     TR Vmax:        279.00 cm/s MV E velocity: 118.00 cm/s MV A velocity: 81.50 cm/s   SHUNTS  MV E/A ratio:  1.45         Systemic VTI:  0.33 m                             Systemic Diam: 1.90 cm Oswaldo Milian MD Electronically signed by Oswaldo Milian MD Signature Date/Time: 02/04/2022/5:39:20 PM    Final    US RENAL  Result Date: 02/04/2022 CLINICAL DATA:  [id number] AKI (acute kidney injury) ([place]) [id number] EXAM: RENAL / URINARY TRACT ULTRASOUND COMPLETE COMPARISON:  02/18/2020 FINDINGS: Right Kidney: Renal measurements: 10.1 x 3.7 x 4.8 cm = volume: 95 mL. Diffusely increased echotexture. No mass or hydronephrosis. Left Kidney: Renal measurements: 9.5 x 4.6 x 5.1 cm = volume: 116 mL. 1 cm midpole cyst. Increased echotexture. No hydronephrosis. Bladder: Appears normal for degree of bladder distention. Other: None. IMPRESSION: Increased echotexture compatible with chronic medical renal disease. No hydronephrosis. Electronically Signed   By: Rolm Baptise M.D.   On: 02/04/2022 09:33   CT Chest  Wo Contrast  Result Date: 02/03/2022 CLINICAL DATA:  Hemoptysis. EXAM: CT CHEST WITHOUT CONTRAST TECHNIQUE: Multidetector CT imaging of the chest was performed following the standard protocol without IV contrast. RADIATION DOSE REDUCTION: This exam was performed according to the departmental dose-optimization program which includes automated exposure control, adjustment of the mA and/or kV according to patient size and/or use of iterative reconstruction technique. COMPARISON:  11/03/2016. FINDINGS: Cardiovascular: Heart is enlarged and there is a small pericardial effusion. The aorta is normal in caliber. The pulmonary trunk is distended suggesting underlying pulmonary artery hypertension. Mediastinum/Nodes: No mediastinal or axillary lymphadenopathy. Evaluation of the hilar regions is limited due to lack of IV contrast. Thyroid gland is not well seen due to streak artifact. There is narrowing of the mid to distal trachea, possibly related to expiratory phase. The esophagus is within normal limits. Lungs/Pleura: Atelectasis is noted at the lung bases. There is a focal opacity in the posterior segment of the right upper lobe measuring 1.8 x 1.5 cm, axial image 53. No effusion or pneumothorax Upper Abdomen: Stones are present in the gallbladder. No acute abnormality. Musculoskeletal: Old healed rib fractures bilaterally. Degenerative changes in the thoracic spine. No acute or suspicious osseous abnormality. IMPRESSION: 1. Focal opacity measuring 1.8 cm in the posterior segment of the right upper lobe which may be infectious or inflammatory. The possibility of underlying neoplasm can not be excluded. Consider one of the following in 3 months for both low-risk and high-risk individuals: (a) repeat chest CT, (b) follow-up PET-CT, or (c) tissue sampling. This recommendation follows the consensus statement: Guidelines for Management of Incidental Pulmonary Nodules Detected on CT Images: From the Fleischner Society 2017;  Radiology 2017; 284:228-243. 2. Distended pulmonary trunk, suggesting underlying pulmonary artery hypertension. 3. Cardiomegaly. 4. Cholelithiasis. Electronically Signed   By: Tiffany [name] M.D.   On: 02/03/2022 21:59   DG Chest 2 View  Result Date: 02/03/2022 CLINICAL DATA:  Provided history: Shortness of breath. EXAM: CHEST - 2 VIEW COMPARISON:  Prior chest radiographs 07/09/2021 and earlier. FINDINGS: Shallow inspiration radiograph. Cardiomegaly. Prominence of the interstitial lung markings, suggesting interstitial edema. No appreciable airspace consolidation. No evidence of pleural effusion or pneumothorax. No acute bony abnormality identified. Chronic, healed left-sided rib fracture deformities. IMPRESSION: 1. Shallow inspiration radiograph. 2. Cardiomegaly. 3. Prominence of the interstitial lung markings, suggesting interstitial edema. 4. No appreciable airspace consolidation. Electronically Signed   By: Kellie Simmering D.O.   On: 02/03/2022 15:19  Labs:  Recent Labs    03/02/22 1754 03/02/22 2302 03/03/22 0532  WBC 8.1 8.0 9.1  HGB 7.1* 6.6* 8.5*  HCT 22.4* 19.9* 25.7*  PLT 271 270 296   Recent Labs    03/01/22 0029 03/02/22 0107 03/03/22 0532  NA 141 143 142  K 4.1 4.1 3.4*  CL 106 107 104  CO2 24 25 29  $ GLUCOSE 91 94 94  BUN 65* 64* 59*  CREATININE 3.08* 2.95* 2.74*  CALCIUM 8.7* 8.7* 8.9   No results for input(s): "PROT", "ALBUMIN", "AST", "ALT", "ALKPHOS", "BILITOT", "BILIDIR", "IBILI" in the last 72 hours. No results for input(s): "HEPBSAG", "HCVAB", "HEPAIGM", "HEPBIGM" in the last 72 hours. Recent Labs    03/02/22 2022  LABPROT 14.6  INR 1.2    Past Medical History:  Diagnosis Date   Agitation 11/22/2017   Anoxic brain injury ([place]) 09/08/2016   C. Arrest due to respiratory failure and COPD exacerbation   Anxiety    Arthritis    "all over" (04/10/2016)   Asthma 10/18/2010   Binge eating disorder    Cardiac arrest ([place]) 09/08/2016   PEA   Carotid artery  stenosis    1-39% bilateral by dopplers 11/2016   Chronic diastolic (congestive) heart failure (HCC)    Chronic kidney disease, stage 3 (HCC)    Chronic pain syndrome 06/18/2012   Chronic post-traumatic stress disorder (PTSD) 05/27/2018   Chronic respiratory failure with hypoxia and hypercapnia ([place]) 06/22/2015   TRILOGY Vent >AVAPA-ES., Vt target 200-400, Max P 30 , PS max 20 , PS min 6-10 , E Max 6, E Min 4, Rate Auto AVAPS Rate 2 (titrate for pt comfort) , bleed O2 at 5l/m continuous flow .    CKD (chronic kidney disease) 02/03/2022   Closed displaced fracture of fifth metacarpal bone 03/21/2018   Cocaine use disorder, severe, in sustained remission ([name]) [id number]   Complication of anesthesia    decreased bp, decreased heart rate   COPD (chronic obstructive pulmonary disease) ([place]) 07/08/2014   Depression    Diabetic neuropathy ([place]) 04/24/2011   Difficulty with speech 01/24/2018   Disorder of nervous system    Drug abuse ([place]) 11/21/2017   Dyslipidemia 04/24/2011   Elevated troponin 04/28/2012   Emphysema    Encephalopathy 11/21/2017   Essential hypertension 03/22/2016   Fibula fracture 07/10/2016   Frequent falls 10/11/2017   GERD (gastroesophageal reflux disease)    Gout 04/11/2017   Heart attack ([place]) 1980s   History of blood transfusion 1994   "couldn't stop bleeding from my period"   History of drug abuse in remission ([place]) 11/28/2015   Quit in 2017   Hyperlipidemia LDL goal <70    Incontinence    Manic depression ([place])    Morbid obesity ([place]) 10/18/2010   Obstructive sleep apnea 10/18/2010   On home oxygen therapy    "6L; 24/7" (04/10/2016)   OSA on CPAP    "wear mask sometimes" (04/10/2016)   Paranoid ([place])    "sometimes; I'm on RX for it" (04/10/2016)   Prolonged Q-T interval on ECG    Rectal bleeding 12/31/2015   Schizoaffective disorder, bipolar type ([place]) 04/05/2018   Seasonal allergies    Seborrheic keratoses 12/31/2013   Seizures ([place])    "don't know  what kind; last one was ~ 1 yr ago" (04/10/2016)   Sinus bradycardia    Stroke [facility]) 1980s   denies residual on 04/10/2016   Thrush 09/19/2013   Type 2 diabetes mellitus ([place]) 10/18/2010  Past Surgical History:  Procedure Laterality Date   CESAREAN SECTION  1997   COLONOSCOPY WITH PROPOFOL N/A 04/01/2021   Procedure: COLONOSCOPY WITH PROPOFOL;  Surgeon: Carol Ada, MD;  Location: [place];  Service: Gastroenterology;  Laterality: N/A;  Rectal bleeding with drop in hemoglobin to 7.2 g/dL   HERNIA REPAIR     IR CHOLANGIOGRAM EXISTING TUBE  07/20/2016   IR PERC CHOLECYSTOSTOMY  05/10/2016   IR RADIOLOGIST EVAL & MGMT  06/08/2016   IR RADIOLOGIST EVAL & MGMT  06/29/2016   IR SINUS/FIST TUBE CHK-NON GI  07/12/2016   RIGHT/LEFT HEART CATH AND CORONARY ANGIOGRAPHY N/A 06/19/2017   Procedure: RIGHT/LEFT HEART CATH AND CORONARY ANGIOGRAPHY;  Surgeon: Jolaine Artist, MD;  Location: [facility];  Service: Cardiovascular;  Laterality: N/A;   TIBIA IM NAIL INSERTION Right 07/12/2016   Procedure: INTRAMEDULLARY (IM) NAIL RIGHT TIBIA;  Surgeon: Leandrew Koyanagi, MD;  Location: [facility];  Service: Orthopedics;  Laterality: Right;   UMBILICAL HERNIA REPAIR  ~ 1963   "that's why I don't have a belly button"   VAGINAL HYSTERECTOMY      Family History  Problem Relation Age of Onset   Cancer Mother        lung   Depression Mother    Cancer Father        prostate   Depression Sister    Anxiety disorder Sister    Schizophrenia Sister    Bipolar disorder Sister    Depression Sister    Depression Brother    Heart failure Other        cousin    Prior to Admission medications   Medication Sig Start Date End Date Taking? Authorizing Provider  albuterol (VENTOLIN HFA) 108 (90 Base) MCG/ACT inhaler Inhale 2 puffs into the lungs every 6 (six) hours as needed for wheezing or shortness of breath. 01/05/22  Yes Tiffany Dales, DO  atorvastatin (LIPITOR) 20 MG tablet Take 1 tablet (20 mg total) by  mouth daily. 06/24/21  Yes [name], MD  carvedilol (COREG) 6.25 MG tablet Take 1 tablet (6.25 mg total) by mouth 2 (two) times daily with a meal. 02/07/22 03/09/22 Yes Lilland, Alana, DO  dicyclomine (BENTYL) 10 MG capsule Take 1 capsule (10 mg total) by mouth 3 (three) times daily before meals. 01/05/22  Yes Tiffany Dales, DO  DULoxetine (CYMBALTA) 30 MG capsule Take 30 mg by mouth daily. 11/22/21  Yes [provider]  escitalopram (LEXAPRO) 20 MG tablet Take 20 mg by mouth daily.   Yes [provider]  Fluticasone-Umeclidin-Vilant (TRELEGY ELLIPTA) 100-62.5-25 MCG/ACT AEPB Inhale 1 puff into the lungs daily. 01/05/22  Yes Tiffany Dales, DO  loperamide (IMODIUM) 2 MG capsule Take 2 capsules (4 mg total) by mouth 3 (three) times daily as needed for diarrhea or loose stools. 02/27/22  Yes Hensel, Jamal Collin, MD  OLANZapine (ZYPREXA) 5 MG tablet Take 1 tablet (5 mg total) by mouth daily. 02/27/22  Yes Hensel, Jamal Collin, MD  omeprazole (PRILOSEC) 40 MG capsule Take 1 capsule (40 mg total) by mouth every morning. Patient taking differently: Take 40 mg by mouth in the morning and at bedtime. 06/24/21  Yes [name], MD  potassium chloride SA (KLOR-CON M) 20 MEQ tablet Take 40 mEq by mouth 2 (two) times daily.   Yes [provider]  prazosin (MINIPRESS) 2 MG capsule TAKE 1 CAPSULE(2 MG) BY MOUTH AT BEDTIME Patient taking differently: Take 2 mg by mouth at bedtime. TAKE 1 CAPSULE(2  MG) BY MOUTH AT BEDTIME 02/27/22  Yes Hensel, Jamal Collin, MD  SAPHRIS 5 MG SUBL 24 hr tablet Place 5 mg under the tongue 2 (two) times daily. 01/27/22  Yes [provider]  torsemide (DEMADEX) 20 MG tablet Take 3 tablets (60 mg total) by mouth daily. 02/27/22  Yes Zenia Resides, MD  aspirin EC 81 MG tablet Take 1 tablet (81 mg total) by mouth daily. Swallow whole. 03/02/22   Holley Bouche, MD  febuxostat (ULORIC) 40 MG tablet Take 1 tablet (40 mg total) by mouth daily. Patient not  taking: Reported on 02/24/2022 01/05/22   Tiffany Dales, DO  furosemide (LASIX) 80 MG tablet Take 1 tablet (80 mg total) by mouth 2 (two) times daily. 03/02/22   Holley Bouche, MD  hydrALAZINE (APRESOLINE) 25 MG tablet Take 1 tablet (25 mg total) by mouth every 8 (eight) hours. 03/02/22 04/01/22  Holley Bouche, MD  pregabalin (LYRICA) 50 MG capsule Take 1 capsule (50 mg total) by mouth 3 (three) times daily. 03/02/22 04/01/22  Holley Bouche, MD    Current Facility-Administered Medications  Medication Dose Route Frequency Provider Last Rate Last Admin   0.9 %  sodium chloride infusion  250 mL Intravenous PRN Eppie Gibson, MD       acetaminophen (TYLENOL) tablet 650 mg  650 mg Oral Q4H PRN Eppie Gibson, MD   650 mg at 03/02/22 2338   asenapine (SAPHRIS) sublingual tablet 5 mg  5 mg Sublingual BID Jim Like B, MD   5 mg at 03/02/22 2327   atorvastatin (LIPITOR) tablet 20 mg  20 mg Oral Daily Jim Like B, MD   20 mg at 03/02/22 0954   carvedilol (COREG) tablet 6.25 mg  6.25 mg Oral BID WC Eppie Gibson, MD   6.25 mg at 03/03/22 [id number]   Chlorhexidine Gluconate Cloth 2 % PADS 6 each  6 each Topical Daily Gleason, Otilio Carpen, PA-C       DULoxetine (CYMBALTA) DR capsule 30 mg  30 mg Oral Daily Jim Like B, MD   30 mg at 03/02/22 0954   escitalopram (LEXAPRO) tablet 20 mg  20 mg Oral Daily Jim Like B, MD   20 mg at 03/02/22 0954   fluticasone furoate-vilanterol (BREO ELLIPTA) 100-25 MCG/ACT 1 puff  1 puff Inhalation Daily Eppie Gibson, MD   1 puff at 03/03/22 0826   And   umeclidinium bromide (INCRUSE ELLIPTA) 62.5 MCG/ACT 1 puff  1 puff Inhalation Daily Eppie Gibson, MD   1 puff at 03/03/22 0826   furosemide (LASIX) tablet 80 mg  80 mg Oral BID Camelia Phenes, MD   80 mg at 03/03/22 [id number]   hydrALAZINE (APRESOLINE) tablet 25 mg  25 mg Oral Q8H Reino Bellis B, NP   25 mg at 03/03/22 0532   lidocaine (LIDODERM) 5 % 1 patch  1 patch Transdermal Q24H Eppie Gibson, MD    1 patch at 03/02/22 0955   OLANZapine (ZYPREXA) tablet 5 mg  5 mg Oral Daily Jim Like B, MD   5 mg at 03/02/22 0953   pantoprazole (PROTONIX) injection 40 mg  40 mg Intravenous Q12H Alcus Dad, MD   40 mg at 03/02/22 2326   prazosin (MINIPRESS) capsule 2 mg  2 mg Oral QHS Eppie Gibson, MD   2 mg at 03/02/22 2126   pregabalin (LYRICA) capsule 100 mg  100 mg Oral TID McDiarmid, Blane Ohara, MD   100 mg at 03/02/22 2126  sodium chloride flush (NS) 0.9 % injection 3 mL  3 mL Intravenous Q12H Jim Like B, MD   3 mL at 03/01/22 2101   sodium chloride flush (NS) 0.9 % injection 3 mL  3 mL Intravenous PRN Eppie Gibson, MD        Allergies as of 02/28/2022 - Review Complete 02/28/2022  Allergen Reaction Noted   Hydrocodone Shortness Of Breath 10/08/2018   Latuda [lurasidone hcl] Anaphylaxis 05/09/2016   Magnesium-containing compounds Anaphylaxis and Other (See Comments) 12/02/2017   Prednisone Anaphylaxis, Swelling, and Other (See Comments) 06/15/2017   Tramadol Anaphylaxis, Swelling, and Rash 05/28/2017   Codeine Nausea And Vomiting and Rash 10/18/2010   Trazodone Other (See Comments) 11/18/2020   Peanut-containing drug products Hives 10/19/2021   Topamax [topiramate] Other (See Comments) 01/05/2021   Sulfa antibiotics Itching 10/18/2010   Tape Rash 10/18/2010    Social History   Socioeconomic History   Marital status: Widowed    Spouse name: Not on file   Number of children: 3   Years of education: 12th   Highest education level: 12th grade  Occupational History   Occupation: disabled    Comment: factory production  Tobacco Use   Smoking status: Former    Packs/day: 1.50    Years: 38.00    Total pack years: 57.00    Types: Cigarettes    Start date: 03/13/1977    Quit date: 04/10/2016    Years since quitting: 5.8    Passive exposure: Past   Smokeless tobacco: Never  Vaping Use   Vaping Use: Never used  Substance and Sexual Activity   Alcohol use: No     Alcohol/week: 0.0 standard drinks of alcohol   Drug use: Not Currently    Types: Cocaine    Comment: 04/10/2016 "last used cocaine back in November 2017"   Sexual activity: Not Currently    Birth control/protection: Surgical  Other Topics Concern   Not on file  Social History Narrative   Has 1 son, [name]   Lives with son and his boyfriend   Her house has ramps and handrails should she ever needs them.    Her mother lives down the street from her and is a good support person in addition to her son.   She drives herself, has private transportation.    Cocaine free since 02/24/16, smoke free since 04/10/16   Social Determinants of Health   Financial Resource Strain: Medium Risk (07/24/2021)   Overall Financial Resource Strain (CARDIA)    Difficulty of Paying Living Expenses: Somewhat hard  Food Insecurity: Unknown (02/05/2022)   Hunger Vital Sign    Worried About Running Out of Food in the Last Year: Patient refused    [place] in the Last Year: Patient refused  Transportation Needs: Unmet Transportation Needs (02/07/2022)   PRAPARE - Hydrologist (Medical): Yes    Lack of Transportation (Non-Medical): Yes  Physical Activity: Inactive (07/24/2021)   Exercise Vital Sign    Days of Exercise per Week: 0 days    Minutes of Exercise per Session: 0 min  Stress: Stress Concern Present (07/24/2021)   [place]    Feeling of Stress : To some extent  Social Connections: Moderately Isolated (07/24/2021)   Social Connection and Isolation Panel [NHANES]    Frequency of Communication with Friends and Family: More than three times a week    Frequency of Social Gatherings with Friends  and Family: More than three times a week    Attends Religious Services: 1 to 4 times per year    Active Member of Clubs or Organizations: No    Attends Archivist Meetings: Never    Marital Status: Widowed   Intimate Partner Violence: Unknown (02/05/2022)   Humiliation, Afraid, Rape, and Kick questionnaire    Fear of Current or Ex-Partner: Patient refused    Emotionally Abused: Patient refused    Physically Abused: Patient refused    Sexually Abused: Patient refused    Review of Systems: All systems reviewed and negative except where noted in HPI.  Physical Exam: Vital signs in last 24 hours: Temp:  [98.3 F (36.8 C)-99.4 F (37.4 C)] 98.6 F (37 C) (02/16 0803) Pulse Rate:  [59-72] 66 (02/16 0803) Resp:  [13-22] 17 (02/16 0803) BP: (109-172)/(65-113) 156/76 (02/16 0803) SpO2:  [91 %-99 %] 96 % (02/16 0826) Weight:  [91.6 kg] 91.6 kg (02/16 0519) Last BM Date : 03/02/22  General:  Alert female in NAD Psych:  Pleasant, cooperative. Normal mood and affect Eyes: Pupils equal Ears:  Normal auditory acuity Nose: No deformity, discharge or lesions Neck:  Supple, no masses felt Lungs:  Clear to auscultation.  Heart:  Regular rate, regular rhythm. Murmur present Abdomen:  Soft, nondistended, nontender, active bowel sounds, no masses felt Rectal :  Incontinent of  soft rust colored stool. No impaction on DRE.  Msk: Symmetrical without gross deformities.  Neurologic:  Alert, oriented, grossly normal neurologically Extremities : No edema Skin:  Intact without significant lesions.    Intake/Output from previous day: 02/15 0701 - 02/16 0700 In: 20 [P.O.:480; Blood:650] Out: 3550 [Urine:3550] Intake/Output this shift:  Total I/O In: -  Out: 600 [Urine:600]    Principal Problem:   Acute on chronic diastolic heart failure (HCC) Active Problems:   COPD (chronic obstructive pulmonary disease) (HCC)   Painless rectal bleeding   Essential hypertension   Acute kidney injury superimposed on chronic kidney disease (HCC)   Schizophrenia, schizo-affective (HCC)   GI bleed   Physical deconditioning   Normocytic anemia   Coronary artery disease involving native coronary artery of  native heart without angina pectoris   Neck pain   Acute exacerbation of CHF (congestive heart failure) ([place])    Tye Savoy, NP-C @  03/03/2022, 9:21 AM  I have taken a history, reviewed the chart and examined the patient. I performed a substantive portion of this encounter, including complete performance of at least one of the key components, in conjunction with the APP. I agree with the APP's note, impression and recommendations  63 year old female with extensive medical history to include HFpEF, oxygen dependent COPD and CKD admitted with volume overload and acute on chronic injury who experienced multiple episodes of bloody bowel movements yesterday with associated drop in the hgb from 9 to 6.6.  She received 1 unit pRBC and her hgb improved to 8. She thinks she passed one bloody bowel movement today, and thinks the bleeding is slowing down.  She had a similar presentation last year and a colonoscopy showed extensive diverticulosis and blood throughout the colon, but no active bleeding.  Her current presentation is consistent with a recurrent diverticular bleed.  Hopefully it is slowing down.  No plans for a repeat colonoscopy unless patient has ongoing and significant bleeding.    Continue supportive care, trend CBC daily GI will follow peripherally this weekend.   Aviv Lengacher E. Candis Schatz, MD [facility]

## 2022-03-04 DIAGNOSIS — D500 Iron deficiency anemia secondary to blood loss (chronic): Secondary | ICD-10-CM | POA: Diagnosis not present

## 2022-03-04 DIAGNOSIS — K625 Hemorrhage of anus and rectum: Secondary | ICD-10-CM | POA: Diagnosis not present

## 2022-03-04 DIAGNOSIS — K5791 Diverticulosis of intestine, part unspecified, without perforation or abscess with bleeding: Secondary | ICD-10-CM | POA: Diagnosis not present

## 2022-03-04 LAB — CBC
HCT: 24.8 % — ABNORMAL LOW (ref 36.0–46.0)
HCT: 25 % — ABNORMAL LOW (ref 36.0–46.0)
Hemoglobin: 7.8 g/dL — ABNORMAL LOW (ref 12.0–15.0)
Hemoglobin: 7.9 g/dL — ABNORMAL LOW (ref 12.0–15.0)
MCH: 28.2 pg (ref 26.0–34.0)
MCH: 28.3 pg (ref 26.0–34.0)
MCHC: 31.5 g/dL (ref 30.0–36.0)
MCHC: 31.6 g/dL (ref 30.0–36.0)
MCV: 89.3 fL (ref 80.0–100.0)
MCV: 89.9 fL (ref 80.0–100.0)
Platelets: 303 10*3/uL (ref 150–400)
Platelets: 307 10*3/uL (ref 150–400)
RBC: 2.76 MIL/uL — ABNORMAL LOW (ref 3.87–5.11)
RBC: 2.8 MIL/uL — ABNORMAL LOW (ref 3.87–5.11)
RDW: 15 % (ref 11.5–15.5)
RDW: 15.1 % (ref 11.5–15.5)
WBC: 9 10*3/uL (ref 4.0–10.5)
WBC: 9 10*3/uL (ref 4.0–10.5)
nRBC: 0 % (ref 0.0–0.2)
nRBC: 0 % (ref 0.0–0.2)

## 2022-03-04 LAB — BASIC METABOLIC PANEL
Anion gap: 10 (ref 5–15)
BUN: 56 mg/dL — ABNORMAL HIGH (ref 8–23)
CO2: 27 mmol/L (ref 22–32)
Calcium: 8.5 mg/dL — ABNORMAL LOW (ref 8.9–10.3)
Chloride: 104 mmol/L (ref 98–111)
Creatinine, Ser: 3.27 mg/dL — ABNORMAL HIGH (ref 0.44–1.00)
GFR, Estimated: 15 mL/min — ABNORMAL LOW (ref >60)
Glucose, Bld: 108 mg/dL — ABNORMAL HIGH (ref 70–99)
Potassium: 3.8 mmol/L (ref 3.5–5.1)
Sodium: 141 mmol/L (ref 135–145)

## 2022-03-04 LAB — HEMOGLOBIN AND HEMATOCRIT, BLOOD
HCT: 30.4 % — ABNORMAL LOW (ref 36.0–46.0)
Hemoglobin: 10.1 g/dL — ABNORMAL LOW (ref 12.0–15.0)

## 2022-03-04 LAB — PREPARE RBC (CROSSMATCH)

## 2022-03-04 MED ORDER — PANTOPRAZOLE SODIUM 40 MG PO TBEC
40.0000 mg | DELAYED_RELEASE_TABLET | Freq: Two times a day (BID) | ORAL | Status: DC
  Administered 2022-03-04 – 2022-03-06 (×5): 40 mg via ORAL
  Filled 2022-03-04 (×5): qty 1

## 2022-03-04 MED ORDER — POTASSIUM CHLORIDE CRYS ER 20 MEQ PO TBCR
20.0000 meq | EXTENDED_RELEASE_TABLET | Freq: Once | ORAL | Status: AC
  Administered 2022-03-04: 20 meq via ORAL
  Filled 2022-03-04: qty 1

## 2022-03-04 MED ORDER — SODIUM CHLORIDE 0.9% IV SOLUTION: Freq: Once | INTRAVENOUS | Status: AC

## 2022-03-04 MED ORDER — FUROSEMIDE 40 MG PO TABS: 80.0000 mg | ORAL_TABLET | Freq: Every day | ORAL | Status: DC

## 2022-03-04 NOTE — Progress Notes (Signed)
Physical Therapy Treatment Patient Details Name: Tiffany [name] MRN: [id number] DOB: 1960/03/02 Today's Date: 03/04/2022   History of Present Illness Tiffany [name] is a 62 y.o. female presenting with shortness of breath, leg swelling, admitted with CHF exacerbation. Has also had worsening renal function over the past few months. PMHx includes CAD s/p MI x2, CHF, HTN, HLD, CKD, schizophrenia/schizoaffective disorder, asthma/COPD, stroke, GERD, chronic pain syndrom, anoxic brain injury, PTSD, CVA and polysubstance use.    PT Comments    Pt very agreeable to therapy, especially getting up to recliner. Able to participate in therapeutic exercise prior to transfers. Pt is mod I for bed mobility and minAx2 for transfer to recliner. PT continues to recommend SNF level care for strengthening with transfers to avoid further falls, however pt is adamantly opposed. PT will continue to follow acutely.   Recommendations for follow up therapy are one component of a multi-disciplinary discharge planning process, led by the attending physician.  Recommendations may be updated based on patient status, additional functional criteria and insurance authorization.  Follow Up Recommendations  Skilled nursing-short term rehab (<3 hours/day) Can patient physically be transported by private vehicle: No   Assistance Recommended at Discharge Frequent or constant Supervision/Assistance  Patient can return home with the following Assist for transportation;Help with stairs or ramp for entrance;Assistance with cooking/housework;Two people to help with walking and/or transfers;A lot of help with bathing/dressing/bathroom   Equipment Recommendations  None recommended by PT    Recommendations for Other Services       Precautions / Restrictions Precautions Precautions: Fall Precaution Comments: Non-ambulatory at baseline, performs sliding board/lateral scoot transfers at baseline. h/o 4-5+ falls in last 6 months  requiring EMS to assist her in getting up Restrictions Weight Bearing Restrictions: No     Mobility  Bed Mobility Overal bed mobility: Needs Assistance Bed Mobility: Supine to Sit Rolling: Modified independent (Device/Increase time)   Supine to sit: Modified independent (Device/Increase time)     General bed mobility comments: mod I, increased time and effort, HoB elevated and minor use of bed rail    Transfers Overall transfer level: Needs assistance Equipment used: None Transfers: Bed to chair/wheelchair/BSC            Lateral/Scoot Transfers: +2 physical assistance, +2 safety/equipment, From elevated surface, Min assist General transfer comment: pt with improved strength and endurance. min Ax2 for management of lines and safety transitioning from bed to chair    Ambulation/Gait               General Gait Details: not ambulatory at baseline, but states she is working towards being able to walk again with HHPT       Balance Overall balance assessment: History of Falls, Needs assistance Sitting-balance support: Feet supported Sitting balance-Leahy Scale: Fair Sitting balance - Comments: supervision for sitting balance at edge of bed and during transfer   Standing balance support: During functional activity, Bilateral upper extremity supported Standing balance-Leahy Scale: Zero Standing balance comment: unable to at baseline                            Cognition Arousal/Alertness: Awake/alert Behavior During Therapy: WFL for tasks assessed/performed Overall Cognitive Status: Within Functional Limits for tasks assessed                                 General Comments: No gross deficits. Oriented  x all but situation to which pt was vague.        Exercises General Exercises - Lower Extremity Ankle Circles/Pumps: AAROM, AROM, Both, 10 reps (AROM plantarflex, AAROM dorsiflexion) Other Exercises Other Exercises: IR of hips x10     General Comments General comments (skin integrity, edema, etc.): Pt receiving blood transfusion, VSS on RA, continues to adamantly refuse SNF level rehab      Pertinent Vitals/Pain Pain Assessment Pain Assessment: Faces Faces Pain Scale: Hurts a little bit Pain Location: "All over" Pain Descriptors / Indicators: Aching, Discomfort Pain Intervention(s): Limited activity within patient's tolerance, Monitored during session, Repositioned     PT Goals (current goals can now be found in the care plan section) Acute Rehab PT Goals Patient Stated Goal: to go home PT Goal Formulation: With patient Time For Goal Achievement: 02/15/22 Potential to Achieve Goals: Good Progress towards PT goals: Progressing toward goals    Frequency    Min 3X/week      PT Plan Current plan remains appropriate (pt refuses)       AM-PAC PT "6 Clicks" Mobility   Outcome Measure  Help needed turning from your back to your side while in a flat bed without using bedrails?: A Lot Help needed moving from lying on your back to sitting on the side of a flat bed without using bedrails?: A Lot Help needed moving to and from a bed to a chair (including a wheelchair)?: Total Help needed standing up from a chair using your arms (e.g., wheelchair or bedside chair)?: Total Help needed to walk in hospital room?: Total Help needed climbing 3-5 steps with a railing? : Total 6 Click Score: 8    End of Session   Activity Tolerance: Patient tolerated treatment well Patient left: with call bell/phone within reach;with chair alarm set;in chair Nurse Communication: Mobility status PT Visit Diagnosis: Muscle weakness (generalized) (M62.81);Other abnormalities of gait and mobility (R26.89);Pain;History of falling (Z91.81);Repeated falls (R29.6);Difficulty in walking, not elsewhere classified (R26.2);Adult, failure to thrive (R62.7) Pain - Right/Left:  (B) Pain - part of body: Leg;Ankle and joints of foot     Time:  [id number] PT Time Calculation (min) (ACUTE ONLY): 21 min  Charges:  $Therapeutic Exercise: 23-37 mins                     Evelynne Spiers B. Migdalia Dk PT, DPT Acute Rehabilitation Services Please use secure chat or  Call Office [phone]    [place] 03/04/2022, 10:09 AM

## 2022-03-04 NOTE — Plan of Care (Signed)

## 2022-03-04 NOTE — Progress Notes (Signed)
Rounding Note    Patient Name: Tiffany [name] Date of Encounter: 03/04/2022  [place] Cardiologist: Fransico Him, MD   Subjective   Feeling better since transfusion.   Inpatient Medications    Scheduled Meds:  asenapine  5 mg Sublingual BID   atorvastatin  20 mg Oral Daily   carvedilol  6.25 mg Oral BID WC   Chlorhexidine Gluconate Cloth  6 each Topical Daily   DULoxetine  30 mg Oral Daily   escitalopram  20 mg Oral Daily   fluticasone furoate-vilanterol  1 puff Inhalation Daily   And   umeclidinium bromide  1 puff Inhalation Daily   furosemide  80 mg Oral BID   hydrALAZINE  25 mg Oral Q8H   lidocaine  1 patch Transdermal Q24H   OLANZapine  5 mg Oral Daily   pantoprazole (PROTONIX) IV  40 mg Intravenous Q12H   prazosin  2 mg Oral QHS   pregabalin  100 mg Oral TID   sodium chloride flush  3 mL Intravenous Q12H   Continuous Infusions:  sodium chloride     PRN Meds: sodium chloride, acetaminophen, sodium chloride flush   Vital Signs    Vitals:   03/04/22 0654 03/04/22 0710 03/04/22 0721 03/04/22 1010  BP: 124/68 130/72 125/78 (!) 141/82  Pulse: 64 66 66 62  Resp: 16 16 15 19  $ Temp: 98.3 F (36.8 C) 98.3 F (36.8 C) 98.3 F (36.8 C) 98.2 F (36.8 C)  TempSrc: Oral Oral Oral Oral  SpO2: 98%  98% 95%  Weight:      Height:        Intake/Output Summary (Last 24 hours) at 03/04/2022 1019 Last data filed at 03/04/2022 1010 Gross per 24 hour  Intake 900 ml  Output 5050 ml  Net -4150 ml      03/04/2022    3:45 AM 03/03/2022    5:19 AM 03/02/2022    1:24 AM  Last 3 Weights  Weight (lbs) 188 lb 4.4 oz 201 lb 15.1 oz 206 lb 9.6 oz  Weight (kg) 85.4 kg 91.6 kg 93.713 kg      Telemetry    Sinus rhythm - Personally Reviewed  ECG    N/a - Personally Reviewed  Physical Exam   VS:  BP (!) 141/82   Pulse 62   Temp 98.2 F (36.8 C) (Oral)   Resp 19   Ht 5' 4"$  (1.626 m)   Wt 85.4 kg   SpO2 95%   BMI 32.32 kg/m  , BMI Body mass index is  32.32 kg/m. GENERAL:  Well appearing HEENT: Pupils equal round and reactive, fundi not visualized, oral mucosa unremarkable NECK:  No jugular venous distention, waveform within normal limits, carotid upstroke brisk and symmetric, no bruits, no thyromegaly LUNGS:  Clear to auscultation bilaterally HEART:  RRR.  PMI not displaced or sustained,S1 and S2 within normal limits, no S3, no S4, no clicks, no rubs, no murmurs ABD:  Flat, positive bowel sounds normal in frequency in pitch, no bruits, no rebound, no guarding, no midline pulsatile mass, no hepatomegaly, no splenomegaly EXT:  2 plus pulses throughout, no edema, no cyanosis no clubbing SKIN:  No rashes no nodules NEURO:  Cranial nerves II through XII grossly intact, motor grossly intact throughout PSYCH:  Cognitively intact, oriented to person place and time   Labs    High Sensitivity Troponin:   Recent Labs  Lab 02/03/22 1426 02/03/22 1710 02/28/22 0437 02/28/22 0556  TROPONINIHS 19* 17  [place]  Lab 02/28/22 0437 02/28/22 1328 02/28/22 1700 03/01/22 0029 03/02/22 0107 03/03/22 0532 03/04/22 0605  NA 143 143   < > 141 143 142 141  K 4.4 3.8   < > 4.1 4.1 3.4* 3.8  CL 108 109   < > 106 107 104 104  CO2 20* 24   < > 24 25 29 27  $ GLUCOSE 82 99   < > 91 94 94 108*  BUN 68* 68*   < > 65* 64* 59* 56*  CREATININE 3.36* 3.27*   < > 3.08* 2.95* 2.74* 3.27*  CALCIUM 9.2 8.6*   < > 8.7* 8.7* 8.9 8.5*  MG 2.0 1.8  --  2.0 2.0  --   --   PROT 6.4*  --   --   --   --   --   --   ALBUMIN 2.7*  --   --   --   --   --   --   AST 18  --   --   --   --   --   --   ALT 10  --   --   --   --   --   --   ALKPHOS 121  --   --   --   --   --   --   BILITOT 0.2*  --   --   --   --   --   --   GFRNONAA 15* 15*   < > 17* 17* 19* 15*  ANIONGAP 15 10   < > 11 11 9 10   $ < > = values in this interval not displayed.    Lipids No results for input(s): "CHOL", "TRIG", "HDL", "LABVLDL", "LDLCALC", "CHOLHDL" in the last  168 hours.  Hematology Recent Labs  Lab 03/03/22 1649 03/04/22 0018 03/04/22 0605  WBC 8.9 9.0 9.0  RBC 2.78* 2.80* 2.76*  HGB 8.0* 7.9* 7.8*  HCT 24.8* 25.0* 24.8*  MCV 89.2 89.3 89.9  MCH 28.8 28.2 28.3  MCHC 32.3 31.6 31.5  RDW 15.1 15.0 15.1  PLT 295 307 303   Thyroid  Recent Labs  Lab 02/28/22 1347  TSH 0.570    BNP Recent Labs  Lab 02/28/22 0437  BNP 542.7*    DDimer No results for input(s): "DDIMER" in the last 168 hours.   Radiology    DG CHEST PORT 1 VIEW  Result Date: 03/02/2022 CLINICAL DATA:  Central line placement. EXAM: PORTABLE CHEST 1 VIEW COMPARISON:  02/28/2022 FINDINGS: Stable enlarged cardiac silhouette. Interval right jugular catheter with its tip in the inferior aspect of the superior vena cava. No pneumothorax. Clear lungs. Thoracic spine degenerative changes. Old, healed left rib fractures. IMPRESSION: 1. Right jugular catheter tip in the inferior aspect of the superior vena cava without pneumothorax. 2. Stable cardiomegaly. Electronically Signed   By: Claudie Revering M.D.   On: 03/02/2022 23:12    Cardiac Studies   Echo 02/04/22:  1. Left ventricular ejection fraction, by estimation, is 60 to 65%. The  left ventricle has normal function. The left ventricle has no regional  wall motion abnormalities. The left ventricular internal cavity size was  mildly dilated. There is mild left  ventricular hypertrophy. Left ventricular diastolic parameters are  consistent with Grade II diastolic dysfunction (pseudonormalization).  Elevated left atrial pressure.   2. Right ventricular systolic function is normal. The right ventricular  size is normal.  There is mildly elevated pulmonary artery systolic  pressure. The estimated right ventricular systolic pressure is [id number] mmHg.   3. Left atrial size was severely dilated.   4. Right atrial size was mildly dilated.   5. The mitral valve is normal in structure. Trivial mitral valve  regurgitation. No evidence of  mitral stenosis.   6. The aortic valve is tricuspid. Aortic valve regurgitation is not  visualized. Aortic valve sclerosis is present, with no evidence of aortic  valve stenosis.   7. The inferior vena cava is dilated in size with >50% respiratory  variability, suggesting right atrial pressure of 8 mmHg.   Patient Profile     62 y.o. female with HFpEF, COPD on home O2, HTN, HL, CKD 4, GI bleed, schizophrenia and cocaine use admitted with acute on chronic HF.  Patient was stabilized from HF standpoint and developed GI bleed prior to discharge.   Assessment & Plan    # Acute on chronic diastolic HF:  # HTN:  BP stable and she appears euvolemic.  Recorded at -4.8L yesterday.  To start PO lasix tomorrow.   Continue carvedilol and hydralazine.   # Acute on chronic CKD 4:  Renal function was improving prior to GI bleed.  Creatinine increased from 2.7 to 3.3 today.  # GI bleed:  Prior colonoscopy showed extensive diverticulosis.  No plans for repeat colonoscopy if it can stabilize on its own.  # Hyperlipidemia: Continue atorvastatin.       For questions or updates, please contact [place] Please consult [email or web address] for contact info under        Signed, Skeet Latch, MD  03/04/2022, 10:19 AM

## 2022-03-04 NOTE — Progress Notes (Addendum)
RN went to patient room to perform assessment and given patient night time medication. The patient appeared to be asleep upon arrival to room.  RN awakened patient by sitting the patient head up and calling the patient's name several times. The patient began to get upset when RN flushed IJ. The patient began to tell RN that "your taking too long" and "doing too much". RN explained that one must flush all lines on IJ. The patient began to belligerent once RN began to perform assessment by taking her sock off to assess lower extremities. The patient sat up in bed and began to cover her feet told RN to "get out" and ask to see charge nurse. The charge nurse was notified immediately of the patient request. Pt. Refused to have lidocaine patch removed.

## 2022-03-04 NOTE — Progress Notes (Signed)
Daily Progress Note Intern Pager: [phone]  Patient name: Tiffany [name] Medical record number: [id number] Date of birth: [birth year] Age: 62 y.o. Gender: female  Primary Care Provider: Leslie Dales, DO Consultants: Cardiology, GI Code Status: FULL  Pt Overview and Major Events to Date:  2/13: Admitted 2/15: Transfused 1 PRBC for GI bleeding 2/17: Transfused another 1U pRBC d/t hgb less than 8  Assessment and Plan:  Tiffany [name] is a 62 y.o female admitted for CHF exacerbation. PMHx pertinent for HFpEF, CKD, T2DM, COPD, h/o cardiac arrest, HTN, schizophrenia, HLD, hx of cocaine use.  * Acute on chronic diastolic heart failure ([place]) ~13.6  L UOP since admission. 85.4 kg today, down from 99.2kg on admission. Diuresing well. Cr bumped up today w/ diuresis. Turned back to RA and saturating well.  - Decrease lasix 80 mg daily -Cardiology following appreciate recs - Maintain K>4, Mag >2 during diuresis - Strict I/O - Daily weights  GI bleed Hgb 7.8 this morning- 1 U ordered. Noted to have large blood clots in stool prior to DC 2 days ago, with a hgb of 7.1. Per nursing had 1 stool that was mixed with blood yesterday but no stools overnight.  History of diverticular bleed in 03/2021 requiring transfusion. GI consulted, likely recurrent diverticular bleed and no plans for repeat colonoscopy unless significant bleeding.  - GI consulted, appreciate recs - IV Protonix - S/p 2 unit PRBC -- F/u post H&H -- Trend CBC  Acute kidney injury superimposed on chronic kidney disease (HCC) Cr worsened today  -Decrease Lasix to 80 mg daily  COPD (chronic obstructive pulmonary disease) (HCC) On RA. - Cont formulary equivalent of home Trelegy - albuterol prn - Outpatient f/u of lung nodule  Essential hypertension Per cardiology note, appears there is concern with use of prazosin in patient with reduced systolic function. Will consider d/c of this medication. - Cont home prazosin  and Coreg  Coronary artery disease involving native coronary artery of native heart without angina pectoris - Continue Coreg - Continue Statin - Continue ASA 81 daily  Schizophrenia, schizo-affective (HCC) - Continue home Saphris and Olanzapine - Continue Lexapro and Cymbalta - UDS positive for cocaine  Physical deconditioning PT/OT recommending SNF, patient has refused SNF to multiple providers. There is concern over safe discharge as patient is unable to ambulate from bed to chair without assistance. HHPT was ordered.  - PT/OT  Neck pain - Lidocaine patch  - Heating pad prn - cont Cymbalta    FEN/GI: Heart healthy PPx: SCDs Dispo:Home pending clinical improvement . Barriers include receiving blood transfusions.   Subjective:  Patient declines any pain today.  She says her breathing has been doing well.  Denies any dark or bloody stools overnight.  Objective: Temp:  [98.2 F (36.8 C)-98.7 F (37.1 C)] 98.2 F (36.8 C) (02/17 1010) Pulse Rate:  [56-88] 62 (02/17 1010) Resp:  [10-31] 19 (02/17 1010) BP: (123-167)/(56-92) 141/82 (02/17 1010) SpO2:  [88 %-100 %] 95 % (02/17 1010) Weight:  [85.4 kg] 85.4 kg (02/17 0345) Physical Exam: General: NAD, laying in bed comfortably, alert and conversant Cardiovascular: Regular rate and rhythm, no murmurs rubs or gallops Respiratory: Clear to auscultation bilaterally, no increased work of breathing on room air Abdomen: Soft, nontender to palpation, nondistended Extremities: Trace pitting edema bilaterally  Laboratory: Most recent CBC Lab Results  Component Value Date   WBC 9.0 03/04/2022   HGB 7.8 (L) 03/04/2022   HCT 24.8 (L) 03/04/2022   MCV  89.9 03/04/2022   PLT 303 03/04/2022   Most recent BMP    Latest Ref Rng & Units 03/04/2022    6:05 AM  BMP  Glucose 70 - 99 mg/dL 108   BUN 8 - 23 mg/dL 56   Creatinine 0.44 - 1.00 mg/dL 3.27   Sodium 135 - 145 mmol/L 141   Potassium 3.5 - 5.1 mmol/L 3.8   Chloride 98 - 111  mmol/L 104   CO2 22 - 32 mmol/L 27   Calcium 8.9 - 10.3 mg/dL 8.5    Gerrit Heck, MD 03/04/2022, 10:38 AM  PGY-2, [place] Intern pager: [phone], text pages welcome Secure chat group [place]

## 2022-03-04 NOTE — Progress Notes (Signed)
FMTS Brief Progress Note  S: Patient sleeping well    O: BP (!) 145/78   Pulse 67   Temp 98.7 F (37.1 C) (Oral)   Resp 16   Ht 5' 4"$  (1.626 m)   Wt 91.6 kg   SpO2 96%   BMI 34.66 kg/m     A/P: - Patient comfortable and VSS - Await 0000 Hgb - Orders reviewed. Labs for AM ordered, which was adjusted as needed.    Erskine Emery, MD 03/04/2022, 12:13 AM PGY-2, [facility] Night Resident  Please page [phone] with questions.

## 2022-03-04 NOTE — Progress Notes (Signed)
Tiffany [name]   Subjective: Patient denies any overt GI bleeding in the last 24 hours.  She has not had a bowel movement.  No other GI symptoms. She received another unit PRBCs when her hemoglobin drifted to 7.8.  Repeat today was 10.1.   Objective: Vital signs in last 24 hours: Temp:  [97.7 F (36.5 C)-98.7 F (37.1 C)] 97.7 F (36.5 C) (02/17 1639) Pulse Rate:  [56-88] 61 (02/17 1639) Resp:  [10-31] 16 (02/17 1639) BP: (116-181)/(56-95) 136/85 (02/17 1639) SpO2:  [88 %-100 %] 99 % (02/17 1639) Weight:  [85.4 kg] 85.4 kg (02/17 0345) Last BM Date : 03/03/22 General: NAD, pleasant African-American female Abdomen: Soft, nontender, nondistended, bowel sounds active    Intake/Output from previous day: 02/16 0701 - 02/17 0700 In: 450 [P.O.:450] Out: 5250 [Urine:5250] Intake/Output this shift: Total I/O In: 717 [P.O.:297; I.V.:30; Blood:390] Out: 600 [Urine:600]   Lab Results: Recent Labs    03/03/22 1649 03/04/22 0018 03/04/22 0605 03/04/22 1215  WBC 8.9 9.0 9.0  --   HGB 8.0* 7.9* 7.8* 10.1*  PLT 295 307 303  --   MCV 89.2 89.3 89.9  --    BMET Recent Labs    03/02/22 0107 03/03/22 0532 03/04/22 0605  NA 143 142 141  K 4.1 3.4* 3.8  CL 107 104 104  CO2 25 29 27  $ GLUCOSE 94 94 108*  BUN 64* 59* 56*  CREATININE 2.95* 2.74* 3.27*  CALCIUM 8.7* 8.9 8.5*   LFT No results for input(s): "PROT", "ALBUMIN", "AST", "ALT", "ALKPHOS", "BILITOT", "BILIDIR", "IBILI" in the last 72 hours. PT/INR Recent Labs    03/02/22 2022  INR 1.2      Imaging/Other results: DG CHEST PORT 1 VIEW  Result Date: 03/02/2022 CLINICAL DATA:  Central line placement. EXAM: PORTABLE CHEST 1 VIEW COMPARISON:  02/28/2022 FINDINGS: Stable enlarged cardiac silhouette. Interval right jugular catheter with its tip in the inferior aspect of the superior vena cava. No pneumothorax. Clear lungs. Thoracic spine degenerative changes. Old, healed left rib fractures.  IMPRESSION: 1. Right jugular catheter tip in the inferior aspect of the superior vena cava without pneumothorax. 2. Stable cardiomegaly. Electronically Signed   By: Claudie Revering M.D.   On: 03/02/2022 23:12      Assessment and Plan:  62 year old female with extensive medical history to include HFpEF, oxygen dependent COPD and CKD admitted with volume overload and acute on chronic kidney injury, who experienced what is most likely a self limited diverticular bleed on February 15, drop in her hemoglobin from 9-6.6.  She has not had any further bleeding episodes since then, but her hemoglobin drifted slightly to 7.8, prompting another unit of blood. Her bleeding appears to have resolved or at least significantly slowed down.   No plans for colonoscopy.  Recommend continued supportive care GI will sign off.  Please reconsult if patient demonstrates active GI bleeding again with associated drops in hemoglobin or changes in hemodynamics.  Diverticular bleed, improving/resolved - Trend CBC daily - Contact GI if recurrent active GI bleeding   Tiffany November, MD  03/04/2022, 6:17 PM [name]

## 2022-03-04 NOTE — Progress Notes (Signed)
Pt hgb resulted at 7.9. On-call Family Medicine provider made aware.

## 2022-03-05 DIAGNOSIS — K922 Gastrointestinal hemorrhage, unspecified: Secondary | ICD-10-CM

## 2022-03-05 DIAGNOSIS — D500 Iron deficiency anemia secondary to blood loss (chronic): Secondary | ICD-10-CM | POA: Diagnosis not present

## 2022-03-05 DIAGNOSIS — K5791 Diverticulosis of intestine, part unspecified, without perforation or abscess with bleeding: Secondary | ICD-10-CM | POA: Diagnosis not present

## 2022-03-05 LAB — CBC
HCT: 26.7 % — ABNORMAL LOW (ref 36.0–46.0)
HCT: 28 % — ABNORMAL LOW (ref 36.0–46.0)
Hemoglobin: 8.5 g/dL — ABNORMAL LOW (ref 12.0–15.0)
Hemoglobin: 9.1 g/dL — ABNORMAL LOW (ref 12.0–15.0)
MCH: 28.8 pg (ref 26.0–34.0)
MCH: 29.1 pg (ref 26.0–34.0)
MCHC: 31.8 g/dL (ref 30.0–36.0)
MCHC: 32.5 g/dL (ref 30.0–36.0)
MCV: 90.5 fL (ref 80.0–100.0)
MCV: 89.5 fL (ref 80.0–100.0)
Platelets: 320 10*3/uL (ref 150–400)
Platelets: 362 10*3/uL (ref 150–400)
RBC: 2.95 MIL/uL — ABNORMAL LOW (ref 3.87–5.11)
RBC: 3.13 MIL/uL — ABNORMAL LOW (ref 3.87–5.11)
RDW: 14.8 % (ref 11.5–15.5)
RDW: 14.9 % (ref 11.5–15.5)
WBC: 7.8 10*3/uL (ref 4.0–10.5)
WBC: 9.6 10*3/uL (ref 4.0–10.5)
nRBC: 0 % (ref 0.0–0.2)
nRBC: 0 % (ref 0.0–0.2)

## 2022-03-05 LAB — BPAM RBC
Blood Product Expiration Date: 202403182359
Blood Product Expiration Date: 202403182359
ISSUE DATE / TIME: 202402160021
ISSUE DATE / TIME: 202402170645
Unit Type and Rh: 5100
Unit Type and Rh: 5100

## 2022-03-05 LAB — BASIC METABOLIC PANEL
Anion gap: 9 (ref 5–15)
BUN: 61 mg/dL — ABNORMAL HIGH (ref 8–23)
CO2: 27 mmol/L (ref 22–32)
Calcium: 8.4 mg/dL — ABNORMAL LOW (ref 8.9–10.3)
Chloride: 103 mmol/L (ref 98–111)
Creatinine, Ser: 2.9 mg/dL — ABNORMAL HIGH (ref 0.44–1.00)
GFR, Estimated: 18 mL/min — ABNORMAL LOW (ref >60)
Glucose, Bld: 104 mg/dL — ABNORMAL HIGH (ref 70–99)
Potassium: 3.7 mmol/L (ref 3.5–5.1)
Sodium: 139 mmol/L (ref 135–145)

## 2022-03-05 LAB — TYPE AND SCREEN
ABO/RH(D): O POS
Antibody Screen: NEGATIVE
Donor AG Type: NEGATIVE
Donor AG Type: NEGATIVE
Unit division: 0
Unit division: 0

## 2022-03-05 LAB — MAGNESIUM: Magnesium: 2.1 mg/dL (ref 1.7–2.4)

## 2022-03-05 MED ORDER — PREGABALIN 25 MG PO CAPS
50.0000 mg | ORAL_CAPSULE | Freq: Three times a day (TID) | ORAL | Status: DC
  Administered 2022-03-05 – 2022-03-06 (×3): 50 mg via ORAL
  Filled 2022-03-05 (×3): qty 2

## 2022-03-05 MED ORDER — POTASSIUM CHLORIDE CRYS ER 20 MEQ PO TBCR
40.0000 meq | EXTENDED_RELEASE_TABLET | Freq: Once | ORAL | Status: AC
  Administered 2022-03-05: 40 meq via ORAL
  Filled 2022-03-05: qty 2

## 2022-03-05 NOTE — Progress Notes (Signed)
Daily Progress Note Intern Pager: [phone]  Patient name: Tiffany [name] Medical record number: [id number] Date of birth: 1960-12-12 Age: 62 y.o. Gender: female  Primary Care Provider: Leslie Dales, DO Consultants: Cardiology, GI Code Status: Full  Pt Overview and Major Events to Date:  2/13- admitted 2/15- transfused 1unit PRBC for GI bleed 2/17- transfused another 1 unit PRBC for Hgb <8  Assessment and Plan:  Tiffany [name] is a 62yo female admitted for acute on chronic diastolic heart failure, subsequently developed GI bleed. Pertinent PMH/PSH includes HFpEF, CKD, T2DM, COPD, h/o cardiac arrest, HTN, schizophrenia, cocaine abuse. Now awaiting spable Hgb prior to discharge home.    * Acute on chronic diastolic heart failure ([place]) ~14.1  L UOP since admission. 84.2 kg today, down from 99.2kg on admission. Appears euvolemic to my exam today. Diuresis has slowed and Cr trending back down (2.74>3.27>2.90) with Lasix 8m PO daily dosing started yesterday---this may need to be her new home dose as she was clearly underdosed on Torsemide 246mdaily PTA. Suspect she is stable for discharge from a strictly cardiac point of view---at this point it is her GI symptoms holding her here, see discussion below.  -  Lasix 80 mg PO daily - Cardiology following appreciate recs - Maintain K>4, Mag >2 during diuresis - Strict I/O - Daily weights - Not a good SGLT2i candidate due to renal insufficiency  GI bleed GI saw yesterday, felt issue was resolving, likely diverticular bleed no plan for colonoscopy at this time.Hgb 10.1>8.5 this am, but without evidence of ongoing active bleeding. Would really like to see this remain >8.0 without need for transfusion before would be comfortable discharging home.  - GI s/o - IV Protonix > can transition to PO in anticipation of discharge - Continue to hold ASA given temporal association of restarting ASA and bleed - S/p 2 unit PRBC -- Trend CBC  Acute  kidney injury superimposed on chronic kidney disease ([facility] today. 2.74>3.27>2.90, recent baseline is ~2.70 - Continue Lasix 8070mO daily - Trend on am BMP - Needs outpatient renal follow-up    COPD (chronic obstructive pulmonary disease) (HCC) On RA. - Cont formulary equivalent of home Trelegy - albuterol prn - Outpatient f/u of lung nodule  Coronary artery disease involving native coronary artery of native heart without angina pectoris Unfortunately does not seem she will be able to tolerate ASA given bleeding in 03/2021 and again this admission with initiation of aspirin. Thankfully does not have any stents, on ASA for pure secondary prevention.  - Continue Coreg - Continue Statin   Schizophrenia, schizo-affective (HCCFisher Continue home Saphris, Olanzapine - Continue Lexapro and Cymbalta - Would favor continuing her home prazosin as well given the two-fold benefit she is getting for both her mental health and her BP, knowing that this drug can have some degree of harm in HF---I suspect that poorly controlled schizophrenia would prove even more deleterious.  - UDS positive for cocaine  Physical deconditioning PT/OT recommending SNF, patient has refused SNF to multiple providers. There is concern over safe discharge as patient is unable to ambulate from bed to chair without assistance. HHPT was ordered.  - PT/OT  Essential hypertension BP better controlled with addition of hydralazine.  - Cont Hydralazine 75m58mh and Coreg 6.75mg88m - Continue prazosin QHS   Neck pain - Lidocaine patch  - Heating pad prn - cont Cymbalta        FEN/GI: Heart healthy  Carb modified PPx:  SCDs Dispo:Home with home health tomorrow. Barriers include hemoglobin stability.   Subjective:  Sleeping on arrival, awakens to voice and tactile stimulation. No acute complaints. Continues to refuse SNF placement.   Objective: Temp:  [97.7 F (36.5 C)-98.3 F (36.8 C)] 98.2 F (36.8 C)  March 12, 2022 0440) Pulse Rate:  [61-72] 61 2022-03-12 0440) Resp:  [15-20] 17 03-12-2022 0440) BP: (110-181)/(61-95) 128/88 March 12, 2022 0440) SpO2:  [95 %-100 %] 95 % 03-12-22 0440) Weight:  [84.2 kg] 84.2 kg 2022-03-12 0440) Physical Exam: General: Sleeping on arrival, NAD  Cardiovascular: RRR, 2/6 systolic murmur, unchanged from my last exam Respiratory: Clear throughout, normal WOB on RA Abdomen: Soft, non-tender, non-distended Extremities: Trace pedal edema  Laboratory: Most recent CBC Lab Results  Component Value Date   WBC 7.8 Mar 12, 2022   HGB 8.5 (L) 12-Mar-2022   HCT 26.7 (L) 03/12/22   MCV 90.5 2022-03-12   PLT 320 2022/03/12   Most recent BMP    Latest Ref Rng & Units 12-Mar-2022    3:39 AM  BMP  Glucose 70 - 99 mg/dL 104   BUN 8 - 23 mg/dL 61   Creatinine 0.44 - 1.00 mg/dL 2.90   Sodium 135 - 145 mmol/L 139   Potassium 3.5 - 5.1 mmol/L 3.7   Chloride 98 - 111 mmol/L 103   CO2 22 - 32 mmol/L 27   Calcium 8.9 - 10.3 mg/dL 8.4      Imaging/Diagnostic Tests: No new imaging studies.    Eppie Gibson, MD Mar 12, 2022, 5:56 AM  PGY-2, [place] Intern pager: [phone], text pages welcome Secure chat group [place]

## 2022-03-05 NOTE — Progress Notes (Signed)
Rounding Note    Patient Name: Tiffany [name] Date of Encounter: 03/05/2022  [facility] Cardiologist: Fransico Him, MD   Subjective   Recurrent GI bleed.  Disappointed that she cannot go home.   Inpatient Medications    Scheduled Meds:  asenapine  5 mg Sublingual BID   atorvastatin  20 mg Oral Daily   carvedilol  6.25 mg Oral BID WC   Chlorhexidine Gluconate Cloth  6 each Topical Daily   DULoxetine  30 mg Oral Daily   escitalopram  20 mg Oral Daily   fluticasone furoate-vilanterol  1 puff Inhalation Daily   And   umeclidinium bromide  1 puff Inhalation Daily   furosemide  80 mg Oral Daily   hydrALAZINE  25 mg Oral Q8H   lidocaine  1 patch Transdermal Q24H   OLANZapine  5 mg Oral Daily   pantoprazole  40 mg Oral BID   prazosin  2 mg Oral QHS   pregabalin  100 mg Oral TID   sodium chloride flush  3 mL Intravenous Q12H   Continuous Infusions:  sodium chloride     PRN Meds: sodium chloride, acetaminophen, sodium chloride flush   Vital Signs    Vitals:   03/04/22 1940 03/04/22 2100 03/05/22 0440 03/05/22 0836  BP: 110/61  128/88 125/72  Pulse: 64  61 66  Resp: 17 18 17 17  $ Temp: 98.2 F (36.8 C)  98.2 F (36.8 C) 98.3 F (36.8 C)  TempSrc: Oral  Oral Oral  SpO2: 98%  95% 96%  Weight:   84.2 kg   Height:        Intake/Output Summary (Last 24 hours) at 03/05/2022 1038 Last data filed at 03/05/2022 1000 Gross per 24 hour  Intake 237 ml  Output 1800 ml  Net -1563 ml      03/05/2022    4:40 AM 03/04/2022    3:45 AM 03/03/2022    5:19 AM  Last 3 Weights  Weight (lbs) 185 lb 10 oz 188 lb 4.4 oz 201 lb 15.1 oz  Weight (kg) 84.2 kg 85.4 kg 91.6 kg      Telemetry    Sinus rhythm.  PVCs. - Personally Reviewed  ECG    N/a - Personally Reviewed  Physical Exam   VS:  BP 125/72 (BP Location: Left Arm)   Pulse 66   Temp 98.3 F (36.8 C) (Oral)   Resp 17   Ht 5' 4"$  (1.626 m)   Wt 84.2 kg   SpO2 96%   BMI 31.86 kg/m  , BMI Body mass  index is 31.86 kg/m. GENERAL:  Well appearing HEENT: Pupils equal round and reactive, fundi not visualized, oral mucosa unremarkable NECK:  No jugular venous distention, waveform within normal limits, carotid upstroke brisk and symmetric, no bruits, no thyromegaly LUNGS:  Clear to auscultation bilaterally HEART:  RRR.  PMI not displaced or sustained,S1 and S2 within normal limits, no S3, no S4, no clicks, no rubs, no murmurs ABD:  Flat, positive bowel sounds normal in frequency in pitch, no bruits, no rebound, no guarding, no midline pulsatile mass, no hepatomegaly, no splenomegaly EXT:  2 plus pulses throughout, no edema, no cyanosis no clubbing SKIN:  No rashes no nodules NEURO:  Cranial nerves II through XII grossly intact, motor grossly intact throughout PSYCH:  Cognitively intact, oriented to person place and time   Labs    High Sensitivity Troponin:   Recent Labs  Lab 02/03/22 1426 02/03/22 1710 02/28/22 0437  02/28/22 0556  TROPONINIHS 19* 17 16 15     $ Chemistry Recent Labs  Lab 02/28/22 0437 02/28/22 1328 03/01/22 0029 03/02/22 0107 03/03/22 0532 03/04/22 0605 03/05/22 0339  NA 143   < > 141 143 142 141 139  K 4.4   < > 4.1 4.1 3.4* 3.8 3.7  CL 108   < > 106 107 104 104 103  CO2 20*   < > 24 25 29 27 27  $ GLUCOSE 82   < > 91 94 94 108* 104*  BUN 68*   < > 65* 64* 59* 56* 61*  CREATININE 3.36*   < > 3.08* 2.95* 2.74* 3.27* 2.90*  CALCIUM 9.2   < > 8.7* 8.7* 8.9 8.5* 8.4*  MG 2.0   < > 2.0 2.0  --   --  2.1  PROT 6.4*  --   --   --   --   --   --   ALBUMIN 2.7*  --   --   --   --   --   --   AST 18  --   --   --   --   --   --   ALT 10  --   --   --   --   --   --   ALKPHOS 121  --   --   --   --   --   --   BILITOT 0.2*  --   --   --   --   --   --   GFRNONAA 15*   < > 17* 17* 19* 15* 18*  ANIONGAP 15   < > 11 11 9 10 9   $ < > = values in this interval not displayed.    Lipids No results for input(s): "CHOL", "TRIG", "HDL", "LABVLDL", "LDLCALC", "CHOLHDL" in  the last 168 hours.  Hematology Recent Labs  Lab 03/04/22 0018 03/04/22 0605 03/04/22 1215 03/05/22 0339  WBC 9.0 9.0  --  7.8  RBC 2.80* 2.76*  --  2.95*  HGB 7.9* 7.8* 10.1* 8.5*  HCT 25.0* 24.8* 30.4* 26.7*  MCV 89.3 89.9  --  90.5  MCH 28.2 28.3  --  28.8  MCHC 31.6 31.5  --  31.8  RDW 15.0 15.1  --  14.8  PLT 307 303  --  320   Thyroid  Recent Labs  Lab 02/28/22 1347  TSH 0.570    BNP Recent Labs  Lab 02/28/22 0437  BNP 542.7*    DDimer No results for input(s): "DDIMER" in the last 168 hours.   Radiology    No results found.  Cardiac Studies   Echo 02/04/22:  1. Left ventricular ejection fraction, by estimation, is 60 to 65%. The  left ventricle has normal function. The left ventricle has no regional  wall motion abnormalities. The left ventricular internal cavity size was  mildly dilated. There is mild left  ventricular hypertrophy. Left ventricular diastolic parameters are  consistent with Grade II diastolic dysfunction (pseudonormalization).  Elevated left atrial pressure.   2. Right ventricular systolic function is normal. The right ventricular  size is normal. There is mildly elevated pulmonary artery systolic  pressure. The estimated right ventricular systolic pressure is [id number] mmHg.   3. Left atrial size was severely dilated.   4. Right atrial size was mildly dilated.   5. The mitral valve is normal in structure. Trivial mitral valve  regurgitation. No evidence of mitral stenosis.  6. The aortic valve is tricuspid. Aortic valve regurgitation is not  visualized. Aortic valve sclerosis is present, with no evidence of aortic  valve stenosis.   7. The inferior vena cava is dilated in size with >50% respiratory  variability, suggesting right atrial pressure of 8 mmHg.   Patient Profile     62 y.o. female with HFpEF, COPD on home O2, HTN, HL, CKD 4, GI bleed, schizophrenia and cocaine use admitted with acute on chronic HF.  Patient was stabilized from  HF standpoint and developed GI bleed prior to discharge.   Assessment & Plan    # Acute on chronic diastolic HF:  # HTN:  BP stable and she appears euvolemic.  Recorded at -883 mL yesterday.  Given her persistent GI bleed, will not start oral lasix today.  If she gets transfusion, recommend giving lasix at that time. Continue carvedilol and hydralazine.   # Acute on chronic CKD 4:  Renal function improving.  Hold lasix today unless transfused as above.   # GI bleed:  Prior colonoscopy showed extensive diverticulosis.  No plans for repeat colonoscopy if it can stabilize on its own.  # Hyperlipidemia: Continue atorvastatin.       For questions or updates, please contact [place] Please consult [email or web address] for contact info under        Signed, Skeet Latch, MD  03/05/2022, 10:38 AM

## 2022-03-05 NOTE — Progress Notes (Addendum)
FMTS Brief Progress Note Called to bedside for dark stools.  S: patient denies headaches, SOB, abdominal pain.  O: BP 125/72 (BP Location: Left Arm)   Pulse 66   Temp 98.3 F (36.8 C) (Oral)   Resp 17   Ht 5' 4"$  (1.626 m)   Wt 185 lb 10 oz (84.2 kg)   SpO2 96%   BMI 31.86 kg/m   General: in no acute distress HEENT: normocephalic and atraumatic Respiratory: clear to auscultation bilaterally anteriorly, non-labored breathing, and on RA Extremities: moving all extremities spontaneously Gastrointestinal: non-tender and non-distended Cardiovascular: regular rate Neuro: alert, oriented x 4   A/P: - ordered Hb recheck at 1600 - Orders reviewed. Labs for AM ordered, which was adjusted as needed.   Camelia Phenes, MD 03/05/2022, 10:20 AM PGY-1, [facility] Resident  Please page [phone] with questions.

## 2022-03-06 ENCOUNTER — Other Ambulatory Visit (HOSPITAL_COMMUNITY): Payer: Self-pay

## 2022-03-06 LAB — BASIC METABOLIC PANEL
Anion gap: 11 (ref 5–15)
BUN: 64 mg/dL — ABNORMAL HIGH (ref 8–23)
CO2: 25 mmol/L (ref 22–32)
Calcium: 8.8 mg/dL — ABNORMAL LOW (ref 8.9–10.3)
Chloride: 104 mmol/L (ref 98–111)
Creatinine, Ser: 2.73 mg/dL — ABNORMAL HIGH (ref 0.44–1.00)
GFR, Estimated: 19 mL/min — ABNORMAL LOW (ref >60)
Glucose, Bld: 93 mg/dL (ref 70–99)
Potassium: 4.1 mmol/L (ref 3.5–5.1)
Sodium: 140 mmol/L (ref 135–145)

## 2022-03-06 LAB — CBC
HCT: 26.6 % — ABNORMAL LOW (ref 36.0–46.0)
Hemoglobin: 8.7 g/dL — ABNORMAL LOW (ref 12.0–15.0)
MCH: 29.5 pg (ref 26.0–34.0)
MCHC: 32.7 g/dL (ref 30.0–36.0)
MCV: 90.2 fL (ref 80.0–100.0)
Platelets: 345 10*3/uL (ref 150–400)
RBC: 2.95 MIL/uL — ABNORMAL LOW (ref 3.87–5.11)
RDW: 14.9 % (ref 11.5–15.5)
WBC: 7.1 10*3/uL (ref 4.0–10.5)
nRBC: 0 % (ref 0.0–0.2)

## 2022-03-06 LAB — MAGNESIUM: Magnesium: 2.4 mg/dL (ref 1.7–2.4)

## 2022-03-06 MED ORDER — HYDRALAZINE HCL 25 MG PO TABS
37.5000 mg | ORAL_TABLET | Freq: Three times a day (TID) | ORAL | 0 refills | Status: DC
  Filled 2022-03-06: qty 135, 30d supply, fill #0

## 2022-03-06 MED ORDER — HYDRALAZINE HCL 25 MG PO TABS: 37.5000 mg | ORAL_TABLET | Freq: Three times a day (TID) | ORAL | Status: DC

## 2022-03-06 NOTE — Progress Notes (Signed)
Progress Note  Patient Name: Tiffany [name] Date of Encounter: 03/06/2022  Primary Cardiologist:   Fransico Him, MD   Subjective   Anxious to go home.  No pain.  Breathing OK.   Inpatient Medications    Scheduled Meds:  asenapine  5 mg Sublingual BID   atorvastatin  20 mg Oral Daily   carvedilol  6.25 mg Oral BID WC   Chlorhexidine Gluconate Cloth  6 each Topical Daily   DULoxetine  30 mg Oral Daily   escitalopram  20 mg Oral Daily   fluticasone furoate-vilanterol  1 puff Inhalation Daily   And   umeclidinium bromide  1 puff Inhalation Daily   hydrALAZINE  25 mg Oral Q8H   lidocaine  1 patch Transdermal Q24H   OLANZapine  5 mg Oral Daily   pantoprazole  40 mg Oral BID   prazosin  2 mg Oral QHS   pregabalin  50 mg Oral TID   sodium chloride flush  3 mL Intravenous Q12H   Continuous Infusions:  sodium chloride     PRN Meds: sodium chloride, acetaminophen, sodium chloride flush   Vital Signs    Vitals:   03/05/22 1619 03/05/22 2010 03/05/22 2211 03/06/22 0403  BP: 121/67 112/74 120/75 (!) 146/88  Pulse: 63 (!) 59  (!) 55  Resp: 15 19  17  $ Temp: 98.3 F (36.8 C) 98.2 F (36.8 C)  98 F (36.7 C)  TempSrc: Oral Oral  Oral  SpO2: 95% 94%  97%  Weight:    83.6 kg  Height:        Intake/Output Summary (Last 24 hours) at 03/06/2022 0844 Last data filed at 03/06/2022 0601 Gross per 24 hour  Intake 960 ml  Output 1830 ml  Net -870 ml   Filed Weights   03/04/22 0345 03/05/22 0440 03/06/22 0403  Weight: 85.4 kg 84.2 kg 83.6 kg    Telemetry    NSR - Personally Reviewed  ECG    NA - Personally Reviewed  Physical Exam   GEN: No acute distress.   Neck: No  JVD Cardiac: RRR, no murmurs, rubs, or gallops.  Respiratory: Clear  to auscultation bilaterally. GI: Soft, nontender, non-distended  MS: No  edema; No deformity. Neuro:  Nonfocal  Psych: Normal affect   Labs    Chemistry Recent Labs  Lab 02/28/22 0437 02/28/22 1328 03/04/22 0605  03/05/22 0339 03/06/22 0524  NA 143   < > 141 139 140  K 4.4   < > 3.8 3.7 4.1  CL 108   < > 104 103 104  CO2 20*   < > 27 27 25  $ GLUCOSE 82   < > 108* 104* 93  BUN 68*   < > 56* 61* 64*  CREATININE 3.36*   < > 3.27* 2.90* 2.73*  CALCIUM 9.2   < > 8.5* 8.4* 8.8*  PROT 6.4*  --   --   --   --   ALBUMIN 2.7*  --   --   --   --   AST 18  --   --   --   --   ALT 10  --   --   --   --   ALKPHOS 121  --   --   --   --   BILITOT 0.2*  --   --   --   --   GFRNONAA 15*   < > 15* 18* 19*  ANIONGAP 15   < >  $10 9 11   D$ < > = values in this interval not displayed.     Hematology Recent Labs  Lab 03/05/22 0339 03/05/22 1610 03/06/22 0524  WBC 7.8 9.6 7.1  RBC 2.95* 3.13* 2.95*  HGB 8.5* 9.1* 8.7*  HCT 26.7* 28.0* 26.6*  MCV 90.5 89.5 90.2  MCH 28.8 29.1 29.5  MCHC 31.8 32.5 32.7  RDW 14.8 14.9 14.9  PLT 320 362 345    Cardiac EnzymesNo results for input(s): "TROPONINI" in the last 168 hours. No results for input(s): "TROPIPOC" in the last 168 hours.   BNP Recent Labs  Lab 02/28/22 0437  BNP 542.7*     DDimer No results for input(s): "DDIMER" in the last 168 hours.   Radiology    No results found.  Cardiac Studies   ECHO:    02/04/22  1. Left ventricular ejection fraction, by estimation, is 60 to 65%. The  left ventricle has normal function. The left ventricle has no regional  wall motion abnormalities. The left ventricular internal cavity size was  mildly dilated. There is mild left  ventricular hypertrophy. Left ventricular diastolic parameters are  consistent with Grade II diastolic dysfunction (pseudonormalization).  Elevated left atrial pressure.   2. Right ventricular systolic function is normal. The right ventricular  size is normal. There is mildly elevated pulmonary artery systolic  pressure. The estimated right ventricular systolic pressure is [id number] mmHg.   3. Left atrial size was severely dilated.   4. Right atrial size was mildly dilated.   5. The  mitral valve is normal in structure. Trivial mitral valve  regurgitation. No evidence of mitral stenosis.   6. The aortic valve is tricuspid. Aortic valve regurgitation is not  visualized. Aortic valve sclerosis is present, with no evidence of aortic  valve stenosis.   7. The inferior vena cava is dilated in size with >50% respiratory  variability, suggesting right atrial pressure of 8 mmHg.    Patient Profile     62 y.o. female with HFpEF, COPD on home O2, HTN, HL, CKD 4, GI bleed, schizophrenia and cocaine use admitted with acute on chronic HF. Patient was stabilized from HF standpoint and developed GI bleed prior to discharge.   Assessment & Plan    Acute on chronic diastolic HF: Net negative 15.4 liters.   Holding diuretic as below.     HTN:  This can be treated in the context of treating the diastolic HF.  BP is up today.   Increase hydralazine to 37.5 mg tid.    Acute on chronic CKD 4:  Creat is stable  Continuing to hold Lasix.   GI bleed:  Plan per primary team.     Hyperlipidemia:  Continue Lipitor.     For questions or updates, please contact [place] Please consult [email or web address] for contact info under Cardiology/STEMI.   Signed, Minus Breeding, MD  03/06/2022, 8:44 AM

## 2022-03-06 NOTE — TOC Transition Note (Addendum)
Transition of Care [facility]) - CM/SW Discharge Note   Patient Details  Name: Tiffany [name] MRN: [id number] Date of Birth: October 04, 1960  Transition of Care [facility]) CM/SW Contact:  Zenon Mayo, RN Phone Number: 03/06/2022, 11:36 AM   Clinical Narrative:    Patient is for dc today, NCM notified Calvin with [facility].  NCM has scheduled PTAR for transport.  Staff RN is aware.  Patient states she will call her aide to let her know she will be home today.  Her aide comes at 1 pm. Patient has line in that needs to come out prior to [employer], Staff Nurse is aware.    Final next level of care: Home w Home Health Services Barriers to Discharge: No Barriers Identified   Patient Goals and CMS Choice CMS Medicare.gov Compare Post Acute Care list provided to:: Patient Choice offered to / list presented to : Patient  Discharge Placement                         Discharge Plan and Services Additional resources added to the After Visit Summary for                  DME Arranged: N/A DME Agency: NA       HH Arranged: RN, Disease Management, PT Major Agency: [facility] Date [facility] Agency Contacted: 03/02/22 Time [place]: [id number] Representative spoke with at [place]: [place] Determinants of Health ([place]) Interventions SDOH Screenings   Food Insecurity: Unknown (02/05/2022)  Housing: Medium Risk (02/07/2022)  Transportation Needs: Unmet Transportation Needs (02/07/2022)  Utilities: Unknown (02/05/2022)  Alcohol Screen: Low Risk  (07/24/2021)  Depression (PHQ2-9): High Risk (02/27/2022)  Financial Resource Strain: Medium Risk (07/24/2021)  Physical Activity: Inactive (07/24/2021)  Social Connections: Moderately Isolated (07/24/2021)  Stress: Stress Concern Present (07/24/2021)  Tobacco Use: Medium Risk (02/28/2022)     Readmission Risk Interventions    02/07/2022    2:22 PM 05/24/2021    6:19 PM 07/26/2020    2:21 PM  Readmission Risk Prevention Plan  Transportation  Screening Complete Complete Complete  Medication Review Press photographer) Complete Referral to Pharmacy Complete  PCP or Specialist appointment within 3-5 days of discharge Complete Complete Complete  HRI or Home Care Consult Complete Complete Complete  SW Recovery Care/Counseling Consult Complete Complete Complete  Palliative Care Screening Not Applicable  Not Applicable  [place] Not Applicable Complete Complete

## 2022-03-06 NOTE — Care Management Important Message (Signed)
Important Message  Patient Details  Name: Tiffany [name] MRN: [id number] Date of Birth: 03/01/1960   Medicare Important Message Given:  Yes     Shelda Altes 03/06/2022, 8:28 AM

## 2022-03-06 NOTE — TOC Transition Note (Signed)
Pt didn't go home over the week-end. Discharge meds (4) are now stored at [facility].

## 2022-03-06 NOTE — Discharge Summary (Signed)
[facility] Discharge Summary  Patient name: Tiffany [name] Medical record number: [id number] Date of birth: [birth year] Age: 62 y.o. Gender: female Date of Admission: 02/28/2022  Date of Discharge: 03/06/22  Admitting Physician: Eppie Gibson, MD  Primary Care Provider: Leslie Dales, DO Consultants: Cardiology, GI   Indication for Hospitalization: Shortness of breath, worsening heart failure requiring IV diuresis   Discharge Diagnoses/Problem List:  Principal Problem for Admission: Heart Failure Exacerbation  Other Problems addressed during stay:  Principal Problem:   Acute on chronic diastolic heart failure ([place]) Active Problems:   GI bleed   Blood loss anemia   Acute kidney injury superimposed on chronic kidney disease ([place])   COPD (chronic obstructive pulmonary disease) ([place])   Coronary artery disease involving native coronary artery of native heart without angina pectoris   Schizophrenia, schizo-affective ([place])   Essential hypertension   Physical deconditioning   Normocytic anemia   Neck pain   Painless rectal bleeding   Acute exacerbation of CHF (congestive heart failure) [facility])    Brief Hospital Course:  Tiffany [name] is a 63 y.o.female with a history of HFpEF, CKD, T2DM, COPD, HTN, Schizophrenia, H/o cocaine use who was admitted to the [facility] Medicine Teaching Service at [facility] for dyspnea. Her hospital course is detailed below:  Dyspnea  Acute on Chronic Heart Failure  Patient presented to PCP on 2/12 for dyspnea. She was found to be very fluid overloaded at the clinic. On admission patient had elevated BNP to 542. She was stable on room air. CXR showed cardiomegaly without evidence of pleural effusions or pulmonary congestion, but patient was 40 lbs above her dry weight. Patient was treated with IV lasix. Patient diuresed well and was back to baseline on 03/02/22. Patient not a good candidate for SGLT2 given her poor renal function.   Cardiology consulted and recommended discharge with lasix held given GI bleeds. She has an appointment with Cardiology on 2/20 and could be restarted at that time if needed.  Physical deconditioning Patient is wheel chair bound. PT/OT recommended SNF- but patient declined. Ordered HHPT.  AKI  Patient has CKD4 at baseline, but increased creatinine from baseline on admission that was most likely prerenal due to intravascular depletion in the setting of volume overload. AKI improved with diuresis.  COPD  Patient on room air at admission. Equivalent of trelegy continued during admission. Albuterol nebs were offered prn.   GI bleed For subsided by still on 2/14, and hemoglobin was stable.  On 2/15 primary team was paged that patient had significant blood clots-hemoglobin down to 7.1.  Patient was consented for blood transfusion, and received 1 PRBC-hemoglobin increased to 8.5. Aspirin was stopped.  GI was consulted who said they believed her bleed was unlikely upper GI bleed. Patient had colonoscopy in 2018 with significant diverticulosis which they believe this bleed is consistent with. Overall patient received 2 u pRBC and hemoglobin was stable at 8.7 on day of discharge.   Other chronic conditions were medically managed with home medications and formulary alternatives as necessary (lexapro, cymbalta, saphris, olanzapine, aspirin, statin, coreg)  PCP Follow-up Recommendations: 1.) Held lasix on discharge. Cardiology appointment on 2/20  2.) Rec outpt follow-up with pulm or cards for pulmonary htn  3.) Needs nephro follow-up  4.) CT with focal opacity on RUL, recommendation for 3 mo imaging follow up  6.) Pregabalin was changed to renal dosing, recommend review of medications for renal dosing. 6.)  Needs psychiatrist outpatient  Disposition: Home  with home health   Discharge Condition: Stable   Discharge Exam:  Vitals:   03/06/22 1020 03/06/22 1123  BP: (!) 184/86 (!) 164/82  Pulse: 66 61   Resp: 18 18  Temp: 98.2 F (36.8 C) 98.8 F (37.1 C)  SpO2: 94% 94%   Physical Exam: General: Chronically ill appearing  Cardiovascular: RRR, II/VI systolic murmur at left upper sternal border, cap refill < 2 seconds Respiratory: Normal work of breathing on room air  Abdomen: Soft, non distended, non tender to palpation  Extremities: Very trace BLE edema   Significant Procedures: Placement of IJ due to poor access and need for blood   Significant Labs and Imaging:  Recent Labs  Lab 03/05/22 0339 03/05/22 1610 03/06/22 0524  WBC 7.8 9.6 7.1  HGB 8.5* 9.1* 8.7*  HCT 26.7* 28.0* 26.6*  PLT 320 362 345   Recent Labs  Lab 03/05/22 0339 03/06/22 0524  NA 139 140  K 3.7 4.1  CL 103 104  CO2 27 25  GLUCOSE 104* 93  BUN 61* 64*  CREATININE 2.90* 2.73*  CALCIUM 8.4* 8.8*  MG 2.1 2.4    CXR 1. Right jugular catheter tip in the inferior aspect of the superior vena cava without pneumothorax. 2. Stable cardiomegaly.  Results/Tests Pending at Time of Discharge: None  Discharge Medications:  Allergies as of 03/06/2022       Reactions   Aspirin    GI bleed x2   Hydrocodone Shortness Of Breath   Latuda [lurasidone Hcl] Anaphylaxis   Magnesium-containing Compounds Anaphylaxis, Other (See Comments)   Tolerated Ensure   Prednisone Anaphylaxis, Swelling, Other (See Comments)   Tongue swelling, lip swelling, throat swelling, per pt    Tramadol Anaphylaxis, Swelling, Rash   Codeine Nausea And Vomiting, Rash   Trazodone Other (See Comments)   paranoia   Peanut-containing Drug Products Hives   Raw peanuts, but can eat peanut butter   Topamax [topiramate] Other (See Comments)   Increases paranoia   Sulfa Antibiotics Itching   Tape Rash        Medication List     STOP taking these medications    dicyclomine 10 MG capsule Commonly known as: Bentyl   febuxostat 40 MG tablet Commonly known as: ULORIC   loperamide 2 MG capsule Commonly known as: IMODIUM    potassium chloride SA 20 MEQ tablet Commonly known as: KLOR-CON M   torsemide 20 MG tablet Commonly known as: DEMADEX       TAKE these medications    albuterol 108 (90 Base) MCG/ACT inhaler Commonly known as: VENTOLIN HFA Inhale 2 puffs into the lungs every 6 (six) hours as needed for wheezing or shortness of breath.   atorvastatin 20 MG tablet Commonly known as: LIPITOR Take 1 tablet (20 mg total) by mouth daily.   carvedilol 6.25 MG tablet Commonly known as: COREG Take 1 tablet (6.25 mg total) by mouth 2 (two) times daily with a meal.   DULoxetine 30 MG capsule Commonly known as: CYMBALTA Take 30 mg by mouth daily.   escitalopram 20 MG tablet Commonly known as: LEXAPRO Take 20 mg by mouth daily.   hydrALAZINE 25 MG tablet Commonly known as: APRESOLINE Take 1.5 tablets (37.5 mg total) by mouth every 8 (eight) hours.   OLANZapine 5 MG tablet Commonly known as: ZYPREXA Take 1 tablet (5 mg total) by mouth daily.   omeprazole 40 MG capsule Commonly known as: PRILOSEC Take 1 capsule (40 mg total) by mouth every morning. What  changed: when to take this   prazosin 2 MG capsule Commonly known as: MINIPRESS TAKE 1 CAPSULE(2 MG) BY MOUTH AT BEDTIME What changed:  how much to take how to take this when to take this   pregabalin 50 MG capsule Commonly known as: LYRICA Take 1 capsule (50 mg total) by mouth 3 (three) times daily. What changed:  medication strength how much to take when to take this   Saphris 5 MG Subl 24 hr tablet Generic drug: asenapine Place 5 mg under the tongue 2 (two) times daily.   Trelegy Ellipta 100-62.5-25 MCG/ACT Aepb Generic drug: Fluticasone-Umeclidin-Vilant Inhale 1 puff into the lungs daily.        Discharge Instructions: Please refer to Patient Instructions section of EMR for full details.  Patient was counseled important signs and symptoms that should prompt return to medical care, changes in medications, dietary  instructions, activity restrictions, and follow up appointments.   Follow-Up Appointments:  Follow-up Information     [name], MD Follow up on 03/07/2022.   Specialty: Cardiology Why: 10:20 AM Contact information: [address] [place] [address] [place], [place] Follow up.   Specialty: [place] Why: Agency will call you to set up atp times Contact information: [place] [address] [phone]         Precious Gilding, DO Follow up on 03/07/2022.   Specialty: Family Medicine Why: 3:50 PM Contact information: [place] [address] [phone]                 Lowry Ram, MD 03/06/2022, 12:17 PM PGY-1, [place]

## 2022-03-06 NOTE — Progress Notes (Signed)
Daily Progress Note Intern Pager: [phone]  Patient name: Tiffany [name] Medical record number: [id number] Date of birth: 1960-02-23 Age: 62 y.o. Gender: female  Primary Care Provider: Leslie Dales, DO Consultants: Cardiology  Code Status: Full   Pt Overview and Major Events to Date:  2/13- admitted 2/15- transfused 1unit PRBC for GI bleed 2/17- transfused another 1 unit PRBC for Hgb <8  Assessment and Plan:  Tiffany [name] is a 61yo female admitted for acute on chronic diastolic heart failure, subsequently developed GI bleed. Pertinent PMH/PSH includes HFpEF, CKD, T2DM, COPD, h/o cardiac arrest, HTN, schizophrenia, cocaine abuse. Now awaiting stable without further bleed for at least 24 hours.   * Acute on chronic diastolic heart failure ([place]) ~15.2  L UOP since admission. 83.6 kg today, down from 99.2kg on admission. Appears euvolemic to my exam today. Cr improved to 2.73 from 3.36 on admission.  - Cardiology following appreciate recs - Plan to restart po lasix today (was being held due to active bleed)  - Maintain K>4, Mag >2  - Strict I/O - Daily weights  GI bleed 24 hours without evidence of further bleed. Hgb stable at 8.7. Could discharge home now that she has remained stable for a day.  - Continue po protonix  - Continue to hold ASA given temporal association of restarting ASA and bleed -- Trend CBC  Acute kidney injury superimposed on chronic kidney disease (HCC) Improved today 2.74, recent baseline is ~2.70 - Can restart po lasix now that she has had no bleeding recently and Cr is improved.  - Trend on am BMP - Needs outpatient renal follow-up    COPD (chronic obstructive pulmonary disease) (HCC) On RA. - Cont formulary equivalent of home Trelegy - albuterol prn - Outpatient f/u of lung nodule  Coronary artery disease involving native coronary artery of native heart without angina pectoris Unfortunately does not seem she will be able to tolerate ASA  given bleeding in 03/2021 and again this admission with initiation of aspirin. Thankfully does not have any stents, on ASA for pure secondary prevention.  - Continue Coreg - Continue Statin   Schizophrenia, schizo-affective ([place]) - Continue home Saphris, Olanzapine - Continue Lexapro and Cymbalta - Would favor continuing her home prazosin as well given the two-fold benefit she is getting for both her mental health and her BP, knowing that this drug can have some degree of harm in HF---I suspect that poorly controlled schizophrenia would prove even more deleterious.  - UDS positive for cocaine  Physical deconditioning PT/OT recommending SNF, patient has refused SNF to multiple providers. There is concern over safe discharge as patient is unable to ambulate from bed to chair without assistance.  - Home health RN and PT ordered  - PT/OT  Essential hypertension Stable.  - Cont Hydralazine 49m q8h and Coreg 6.251mBID - Continue prazosin QHS   Neck pain - Lidocaine patch  - Heating pad prn - cont Cymbalta     FEN/GI:  PPx: None given recent GI bleed  Dispo:Home with home health   Subjective:  Patient says she feels well. Denies abdominal pain or blood per rectum. Denies lightheadedness. Says she is eager to go home.   Objective: Temp:  [98 F (36.7 C)-98.3 F (36.8 C)] 98 F (36.7 C) (02/19 0403) Pulse Rate:  [55-66] 55 (02/19 0403) Resp:  [12-19] 17 (02/19 0403) BP: (112-146)/(67-88) 146/88 (02/19 0403) SpO2:  [91 %-97 %] 97 % (02/19 0403) Weight:  [83.6 kg] 83.6 kg (  02/19 0403) Physical Exam: General: Chronically ill appearing  Cardiovascular: RRR, II/VI systolic murmur at left upper sternal border, cap refill < 2 seconds Respiratory: Normal work of breathing on room air  Abdomen: Soft, non distended, non tender to palpation  Extremities: Very trace BLE edema   Laboratory: Most recent CBC Lab Results  Component Value Date   WBC 7.1 03/06/2022   HGB 8.7 (L)  03/06/2022   HCT 26.6 (L) 03/06/2022   MCV 90.2 03/06/2022   PLT 345 03/06/2022   Most recent BMP    Latest Ref Rng & Units 03/06/2022    5:24 AM  BMP  Glucose 70 - 99 mg/dL 93   BUN 8 - 23 mg/dL 64   Creatinine 0.44 - 1.00 mg/dL 2.73   Sodium 135 - 145 mmol/L 140   Potassium 3.5 - 5.1 mmol/L 4.1   Chloride 98 - 111 mmol/L 104   CO2 22 - 32 mmol/L 25   Calcium 8.9 - 10.3 mg/dL 8.8     Tiffany Ram, MD 03/06/2022, 8:06 AM  PGY-1, [place] Intern pager: [phone], text pages welcome Secure chat group [place]

## 2022-03-07 ENCOUNTER — Ambulatory Visit (INDEPENDENT_AMBULATORY_CARE_PROVIDER_SITE_OTHER): Payer: 59 | Admitting: Student

## 2022-03-07 ENCOUNTER — Telehealth: Payer: Self-pay

## 2022-03-07 ENCOUNTER — Ambulatory Visit: Payer: 59 | Attending: Internal Medicine | Admitting: Internal Medicine

## 2022-03-07 ENCOUNTER — Encounter: Payer: Self-pay | Admitting: Internal Medicine

## 2022-03-07 VITALS — BP 165/96 | HR 64

## 2022-03-07 VITALS — BP 152/88 | HR 75 | Ht 64.0 in | Wt 206.0 lb

## 2022-03-07 DIAGNOSIS — I5032 Chronic diastolic (congestive) heart failure: Secondary | ICD-10-CM | POA: Diagnosis not present

## 2022-03-07 DIAGNOSIS — R32 Unspecified urinary incontinence: Secondary | ICD-10-CM

## 2022-03-07 DIAGNOSIS — I10 Essential (primary) hypertension: Secondary | ICD-10-CM

## 2022-03-07 DIAGNOSIS — R5381 Other malaise: Secondary | ICD-10-CM

## 2022-03-07 DIAGNOSIS — N189 Chronic kidney disease, unspecified: Secondary | ICD-10-CM

## 2022-03-07 DIAGNOSIS — I5033 Acute on chronic diastolic (congestive) heart failure: Secondary | ICD-10-CM

## 2022-03-07 DIAGNOSIS — R0989 Other specified symptoms and signs involving the circulatory and respiratory systems: Secondary | ICD-10-CM

## 2022-03-07 DIAGNOSIS — R7989 Other specified abnormal findings of blood chemistry: Secondary | ICD-10-CM | POA: Diagnosis not present

## 2022-03-07 DIAGNOSIS — K5791 Diverticulosis of intestine, part unspecified, without perforation or abscess with bleeding: Secondary | ICD-10-CM | POA: Diagnosis not present

## 2022-03-07 DIAGNOSIS — I6529 Occlusion and stenosis of unspecified carotid artery: Secondary | ICD-10-CM

## 2022-03-07 DIAGNOSIS — N179 Acute kidney failure, unspecified: Secondary | ICD-10-CM | POA: Diagnosis not present

## 2022-03-07 DIAGNOSIS — F419 Anxiety disorder, unspecified: Secondary | ICD-10-CM

## 2022-03-07 DIAGNOSIS — R0600 Dyspnea, unspecified: Secondary | ICD-10-CM

## 2022-03-07 DIAGNOSIS — F32A Depression, unspecified: Secondary | ICD-10-CM

## 2022-03-07 DIAGNOSIS — L98421 Non-pressure chronic ulcer of back limited to breakdown of skin: Secondary | ICD-10-CM

## 2022-03-07 DIAGNOSIS — I2510 Atherosclerotic heart disease of native coronary artery without angina pectoris: Secondary | ICD-10-CM

## 2022-03-07 DIAGNOSIS — R159 Full incontinence of feces: Secondary | ICD-10-CM

## 2022-03-07 DIAGNOSIS — D638 Anemia in other chronic diseases classified elsewhere: Secondary | ICD-10-CM | POA: Diagnosis not present

## 2022-03-07 MED ORDER — CARVEDILOL 12.5 MG PO TABS: 12.5000 mg | ORAL_TABLET | Freq: Two times a day (BID) | ORAL | 3 refills | Status: DC

## 2022-03-07 MED ORDER — AMLODIPINE BESYLATE 5 MG PO TABS: 5.0000 mg | ORAL_TABLET | Freq: Two times a day (BID) | ORAL | 3 refills | Status: DC

## 2022-03-07 NOTE — Transitions of Care (Post Inpatient/ED Visit) (Signed)
   03/07/2022  Name: Tiffany [name] MRN: [id number] DOB: 13-Aug-1960  Today's TOC FU Call Status: Today's TOC FU Call Status:: Successful TOC FU Call Competed TOC FU Call Complete Date: 03/07/22  Transition Care Management Follow-up Telephone Call Date of Discharge: 03/06/22 Discharge Facility: [name]) Type of Discharge: Inpatient Admission Primary Inpatient Discharge Diagnosis:: heart failure How have you been since you were released from the hospital?: Better Any questions or concerns?: No  Items Reviewed: Did you receive and understand the discharge instructions provided?: No Medications obtained and verified?: Yes (Medications Reviewed) Any new allergies since your discharge?: No Dietary orders reviewed?: Yes Do you have support at home?: Yes People in Home: alone  Home Care and Equipment/Supplies: [place] Ordered?: Yes Name of [place]:: unknown Has Agency set up a time to come to your home?: No EMR reviewed for [place] Orders: Orders present/patient has not received call (refer to CM for follow-up) Any new equipment or medical supplies ordered?: NA  Functional Questionnaire: Do you need assistance with bathing/showering or dressing?: No Do you need assistance with meal preparation?: No Do you need assistance with eating?: No Do you have difficulty maintaining continence: No Do you need assistance with getting out of bed/getting out of a chair/moving?: No Do you have difficulty managing or taking your medications?: No  Folllow up appointments reviewed: PCP Follow-up appointment confirmed?: Yes Date of PCP follow-up appointment?: 03/07/22 Follow-up Provider: [facility] Follow-up appointment confirmed?: NA Do you need transportation to your follow-up appointment?: No Do you understand care options if your condition(s) worsen?: Yes-patient verbalized understanding    [place], [place] Direct Dial [phone]

## 2022-03-07 NOTE — Patient Instructions (Addendum)
It was great to see you! Thank you for allowing me to participate in your care!  I recommend that you always bring your medications to each appointment as this makes it easy to ensure you are on the correct medications and helps [name] not miss when refills are needed.  Our plans for today:  - I will contact the cardiologist about whether or not you should be taking your Lasix - I recommend calling your insurance company to find out what psychiatrists in the area are in network - If you have more bleeding from your rectum, feel light headed or like you heart is beating very fast, or feel short of breath these are reasons to seek care in our office, an urgent care or the emergency department -We will try to set up arrangements for wound care at home - Return for a follow up visit in 1 week  Take care and seek immediate care sooner if you develop any concerns.   Dr. Precious Gilding, DO [facility]

## 2022-03-07 NOTE — Patient Instructions (Addendum)
Medication Instructions: INCREASE CARVEDILOL TO 12.5 MG TWICE A DAY  START AMLODIPINE 5 MG TWICE A DAY   *If you need a refill on your cardiac medications before your next appointment, please call your pharmacy*   Lab Work: LIPID, BMET, PRO BNP, CBC   If you have labs (blood work) drawn today and your tests are completely normal, you will receive your results only by: MyChart Message (if you have MyChart) OR A paper copy in the mail If you have any lab test that is abnormal or we need to change your treatment, we will call you to review the results.   Testing/Procedures:    Follow-Up: At [facility], you and your health needs are our priority.  As part of our continuing mission to provide you with exceptional heart care, we have created designated Provider Care Teams.  These Care Teams include your primary Cardiologist (physician) and Advanced Practice Providers (APPs -  Physician Assistants and Nurse Practitioners) who all work together to provide you with the care you need, when you need it.  We recommend signing up for the patient portal called "MyChart".  Sign up information is provided on this After Visit Summary.  MyChart is used to connect with patients for Virtual Visits (Telemedicine).  Patients are able to view lab/test results, encounter notes, upcoming appointments, etc.  Non-urgent messages can be sent to your provider as well.   To learn more about what you can do with MyChart, go to [email or web address].    Your next appointment:   4 week(s)  Dorris Carnes MD      Other Instructions  REFERRAL TO NEPHROLOGY

## 2022-03-07 NOTE — Progress Notes (Signed)
Cardiology Office Note   Date:  03/07/2022   ID:  Tiffany, [name] August 18, 1960, MRN [id number]  PCP:  Tiffany Dales, DO  Cardiologist:   Tiffany Carnes, MD   Pt presents for follow up of HFpEF      History of Present Illness: Tiffany [name] is a 62 y.o. female with a history of HFpEF, CKD, T2DM, COPD, HTN, GI bleeding schizophrenia, cocaine use     She was recently admitted to the hospital for dyspnea.  BNP 542 on admit.  Pt treated with IV lasix with significant diuresis  Hgb 7.1 on admit   Transfused    Cardiology saw the patient  Ankles flat on d/c   Since she was discharged from the hospital she has noticed swelling developing again in her legs She does sit in a wheelchair most of day  Food made by aide  who is watching salt content.  The pt denies CP  Breathing is better  She notices  a lot ot twitching   Thinks it is her potassium   Says she is taking lasix   Not on d/c meds               Current Meds  Medication Sig   albuterol (VENTOLIN HFA) 108 (90 Base) MCG/ACT inhaler Inhale 2 puffs into the lungs every 6 (six) hours as needed for wheezing or shortness of breath.   atorvastatin (LIPITOR) 20 MG tablet Take 1 tablet (20 mg total) by mouth daily.   carvedilol (COREG) 6.25 MG tablet Take 1 tablet (6.25 mg total) by mouth 2 (two) times daily with a meal.   DULoxetine (CYMBALTA) 30 MG capsule Take 30 mg by mouth daily.   escitalopram (LEXAPRO) 20 MG tablet Take 20 mg by mouth daily.   Fluticasone-Umeclidin-Vilant (TRELEGY ELLIPTA) 100-62.5-25 MCG/ACT AEPB Inhale 1 puff into the lungs daily.   hydrALAZINE (APRESOLINE) 25 MG tablet Take 1.5 tablets (37.5 mg total) by mouth every 8 (eight) hours.   OLANZapine (ZYPREXA) 5 MG tablet Take 1 tablet (5 mg total) by mouth daily.   omeprazole (PRILOSEC) 40 MG capsule Take 1 capsule (40 mg total) by mouth every morning. (Patient taking differently: Take 40 mg by mouth in the morning and at bedtime.)   prazosin (MINIPRESS) 2 MG  capsule TAKE 1 CAPSULE(2 MG) BY MOUTH AT BEDTIME (Patient taking differently: Take 2 mg by mouth at bedtime. TAKE 1 CAPSULE(2 MG) BY MOUTH AT BEDTIME)   pregabalin (LYRICA) 50 MG capsule Take 1 capsule (50 mg total) by mouth 3 (three) times daily.   SAPHRIS 5 MG SUBL 24 hr tablet Place 5 mg under the tongue 2 (two) times daily.     Allergies:   Aspirin, Hydrocodone, Latuda [lurasidone hcl], Magnesium-containing compounds, Prednisone, Tramadol, Codeine, Trazodone, Peanut-containing drug products, Topamax [topiramate], Sulfa antibiotics, and Tape   Past Medical History:  Diagnosis Date   Agitation 11/22/2017   Anoxic brain injury ([place]) 09/08/2016   C. Arrest due to respiratory failure and COPD exacerbation   Anxiety    Arthritis    "all over" (04/10/2016)   Asthma 10/18/2010   Binge eating disorder    Cardiac arrest ([place]) 09/08/2016   PEA   Carotid artery stenosis    1-39% bilateral by dopplers 11/2016   Chronic diastolic (congestive) heart failure (HCC)    Chronic kidney disease, stage 3 (HCC)    Chronic pain syndrome 06/18/2012   Chronic post-traumatic stress disorder (PTSD) 05/27/2018   Chronic respiratory failure with hypoxia  and hypercapnia ([place]) 06/22/2015   TRILOGY Vent >AVAPA-ES., Vt target 200-400, Max P 30 , PS max 20 , PS min 6-10 , E Max 6, E Min 4, Rate Auto AVAPS Rate 2 (titrate for pt comfort) , bleed O2 at 5l/m continuous flow .    CKD (chronic kidney disease) 02/03/2022   Closed displaced fracture of fifth metacarpal bone 03/21/2018   Cocaine use disorder, severe, in sustained remission ([place]) [id number]   Complication of anesthesia    decreased bp, decreased heart rate   COPD (chronic obstructive pulmonary disease) ([place]) 07/08/2014   Depression    Diabetic neuropathy ([place]) 04/24/2011   Difficulty with speech 01/24/2018   Disorder of nervous system    Drug abuse ([place]) 11/21/2017   Dyslipidemia 04/24/2011   Elevated troponin 04/28/2012   Emphysema     Encephalopathy 11/21/2017   Essential hypertension 03/22/2016   Fibula fracture 07/10/2016   Frequent falls 10/11/2017   GERD (gastroesophageal reflux disease)    Gout 04/11/2017   Heart attack ([place]) 1980s   History of blood transfusion 1994   "couldn't stop bleeding from my period"   History of drug abuse in remission ([place]) 11/28/2015   Quit in 2017   Hyperlipidemia LDL goal <70    Incontinence    Manic depression ([place])    Morbid obesity ([place]) 10/18/2010   Obstructive sleep apnea 10/18/2010   On home oxygen therapy    "6L; 24/7" (04/10/2016)   OSA on CPAP    "wear mask sometimes" (04/10/2016)   Paranoid ([place])    "sometimes; I'm on RX for it" (04/10/2016)   Prolonged Q-T interval on ECG    Rectal bleeding 12/31/2015   Schizoaffective disorder, bipolar type ([place]) 04/05/2018   Seasonal allergies    Seborrheic keratoses 12/31/2013   Seizures ([place])    "don't know what kind; last one was ~ 1 yr ago" (04/10/2016)   Sinus bradycardia    Stroke [facility]) 1980s   denies residual on 04/10/2016   Thrush 09/19/2013   Type 2 diabetes mellitus ([place]) 10/18/2010    Past Surgical History:  Procedure Laterality Date   CESAREAN SECTION  1997   COLONOSCOPY WITH PROPOFOL N/A 04/01/2021   Procedure: COLONOSCOPY WITH PROPOFOL;  Surgeon: Carol Ada, MD;  Location: [facility];  Service: Gastroenterology;  Laterality: N/A;  Rectal bleeding with drop in hemoglobin to 7.2 g/dL   HERNIA REPAIR     IR CHOLANGIOGRAM EXISTING TUBE  07/20/2016   IR PERC CHOLECYSTOSTOMY  05/10/2016   IR RADIOLOGIST EVAL & MGMT  06/08/2016   IR RADIOLOGIST EVAL & MGMT  06/29/2016   IR SINUS/FIST TUBE CHK-NON GI  07/12/2016   RIGHT/LEFT HEART CATH AND CORONARY ANGIOGRAPHY N/A 06/19/2017   Procedure: RIGHT/LEFT HEART CATH AND CORONARY ANGIOGRAPHY;  Surgeon: Jolaine Artist, MD;  Location: [facility];  Service: Cardiovascular;  Laterality: N/A;   TIBIA IM NAIL INSERTION Right 07/12/2016   Procedure: INTRAMEDULLARY (IM)  NAIL RIGHT TIBIA;  Surgeon: Leandrew Koyanagi, MD;  Location: [facility];  Service: Orthopedics;  Laterality: Right;   UMBILICAL HERNIA REPAIR  ~ 1963   "that's why I don't have a belly button"   VAGINAL HYSTERECTOMY       Social History:  The patient  reports that she quit smoking about 5 years ago. Her smoking use included cigarettes. She started smoking about 45 years ago. She has a 57.00 pack-year smoking history. She has been exposed to tobacco smoke. She has never used smokeless tobacco. She  reports that she does not currently use drugs after having used the following drugs: Cocaine. She reports that she does not drink alcohol.   Family History:  The patient's family history includes Anxiety disorder in her sister; Bipolar disorder in her sister; Cancer in her father and mother; Depression in her brother, mother, sister, and sister; Heart failure in an other family member; Schizophrenia in her sister.    ROS:  Please see the history of present illness. All other systems are reviewed and  Negative to the above problem except as noted.    PHYSICAL EXAM: VS:  BP (!) 152/88   Pulse 75   Ht 5' 4"$  (1.626 m)   Wt 206 lb (93.4 kg)   SpO2 96%   BMI 35.36 kg/m   GEN: Obese 62 yo  Examined in wheelchair   HEENT: normal  Neck:  JVP increased  No bruits   Cardiac: RRR; no murmu   1+ LE  edema  Respiratory:  clear to auscultation bilaterally, GI: soft, nontender, nondistended, + BS  No hepatomegaly  MS: no deformity Moving all extremities   Skin: warm and dry, no rash Neuro:  Strength and sensation are intact Psych: euthymic mood, full affect   EKG:  EKG is not ordered today.    ECHO:    02/04/22   1. Left ventricular ejection fraction, by estimation, is 60 to 65%. The  left ventricle has normal function. The left ventricle has no regional  wall motion abnormalities. The left ventricular internal cavity size was  mildly dilated. There is mild left  ventricular hypertrophy. Left ventricular  diastolic parameters are  consistent with Grade II diastolic dysfunction (pseudonormalization).  Elevated left atrial pressure.   2. Right ventricular systolic function is normal. The right ventricular  size is normal. There is mildly elevated pulmonary artery systolic  pressure. The estimated right ventricular systolic pressure is [id number] mmHg.   3. Left atrial size was severely dilated.   4. Right atrial size was mildly dilated.   5. The mitral valve is normal in structure. Trivial mitral valve  regurgitation. No evidence of mitral stenosis.   6. The aortic valve is tricuspid. Aortic valve regurgitation is not  visualized. Aortic valve sclerosis is present, with no evidence of aortic  valve stenosis.   7. The inferior vena cava is dilated in size with >50% respiratory  variability, suggesting right atrial pressure of 8 mmHg.    Lipid Panel    Component Value Date/Time   CHOL 127 08/05/2018 1748   TRIG 53 08/05/2018 1748   HDL 62 08/05/2018 1748   CHOLHDL 2.0 08/05/2018 1748   CHOLHDL 3.7 01/07/2017 0005   VLDL 38 01/07/2017 0005   LDLCALC 54 08/05/2018 1748   LDLDIRECT 52 04/05/2012 1210      Wt Readings from Last 3 Encounters:  03/07/22 206 lb (93.4 kg)  03/06/22 184 lb 4.9 oz (83.6 kg)  02/27/22 218 lb 12.8 oz (99.2 kg)      ASSESSMENT AND PLAN:  1  HFpEF  Pt recently admitted with acute HFpEF.  Diuresed.   Volume does appear to be up a little on exam but not bad   Lungs CTA     Will get labs today   Limit salt  2  HTN  BP is not  controlled   She does not like hydralazine as she says it gives her a HA   I would recomm increasing carvedilol to 12.5 bid    Add amlodipine 5  mg bid   She has been on it in the past    Follow up in 4 wks    3  CKD   Recent Cr 2.83  Pt needs an appt in renal clinic  Will repeat  today  4   HL  Get lipids today    5 Anemia   Recent admit CBC was 7  transfused.  The pt was seen by GI  Felt due to diverticulosis   Repeat CBC     Current  medicines are reviewed at length with the patient today.  The patient does not have concerns regarding medicines.  Signed, Tiffany Carnes, MD  03/07/2022 11:01 AM    [facility] [address], [place], [place]  [address] Phone: [phone]; Fax: [phone]

## 2022-03-07 NOTE — Progress Notes (Unsigned)
    SUBJECTIVE:   CHIEF COMPLAINT / HPI:  Patient presents for hospital follow-up for the following issues:  Chronic diastolic heart failure Patient hospitalized from 2/13-2/19 for heart failure exacerbation with elevated BNP and 40 pounds over her dry weight.  She remained stable on room air during hospitalization.  She was treated with IV Lasix.  Echo on 02/04/2022 showed EF 60 to 65% and showed grade 2 diastolic dysfunction.  Cardiology consulted during hospitalization and recommended holding Lasix at discharge with plans to follow-up with them this morning which she did.  Cards increased her coreg to 12.41m and added amplodipine 5 mg BID.  Cardiology collected blood for lipid panel, BNP, BMP and CBC. Pt states she has been taking Lasix 80 mg BID since she left the hospital even though its not on her med list. She states the cardiologist today did not mention the lasix. Pt states she has to keep taking it.   GI bleed Patient had blood per rectum in hospital, received total of 2 units PRBCs with hemoglobin of 8.7 on discharge.  GI was consulted who thought bleeding was likely due to diverticulosis. Has not had any more bleeding since she left the hospital.   Lesion on lungs CT with incidental finding of opacity on right upper lung with recommendation for imaging in 3 months.  Depression PHQ-9 positive for number 9. Feels it may be better off if she was't here because she feels that she is already dying and its her time. Denies any active plan to harm herself. When she feel sad she watches TV, prays and talks with friends on the phone. Pt states she would like to see a psychiatrist.   PERTINENT  PMH / [place] ***  OBJECTIVE:   Vitals:   03/07/22 1556 03/07/22 1734  BP: (!) 180/79 (!) 165/96  Pulse: 64   SpO2: 100%      General: NAD, pleasant, able to participate in exam Cardiac: RRR, no murmurs. Respiratory: CTAB, normal effort, No wheezes, rales or rhonchi Abdomen: Bowel sounds  present, nontender, nondistended, no hepatosplenomegaly. Extremities: 2+ pitting edema bilaterally  Skin: warm and dry, no rashes noted Neuro: alert, no obvious focal deficits Psych: Normal affect and mood  ASSESSMENT/PLAN:   No problem-specific Assessment & Plan notes found for this encounter.   *** send page a message about wound care w/ HH  Dr. SPrecious Gilding DFort Ransom   {    This will disappear when note is signed, click to select method of visit    :1}

## 2022-03-07 NOTE — Telephone Encounter (Signed)
Pt seen in the office today and labs ordered but unable to get her labs drawn due to dehydration and over use of her veins from recent hosp admission.   LMTCB to make her another lab appt by the end of the week after she has a chance to hydrate better.   Referral made to Nephrology/ urgent... if appt soon they can get her labs at their office.   Labs changed to future.   Also need to ask the pt what Pharmacy she has been using... [facility] has no record of her getting her lasix since last year and Dr Harrington Challenger needs to verify the dose she has been taking.

## 2022-03-08 DIAGNOSIS — R0989 Other specified symptoms and signs involving the circulatory and respiratory systems: Secondary | ICD-10-CM | POA: Insufficient documentation

## 2022-03-08 DIAGNOSIS — R159 Full incontinence of feces: Secondary | ICD-10-CM | POA: Insufficient documentation

## 2022-03-08 DIAGNOSIS — L98421 Non-pressure chronic ulcer of back limited to breakdown of skin: Secondary | ICD-10-CM

## 2022-03-08 DIAGNOSIS — R32 Unspecified urinary incontinence: Secondary | ICD-10-CM | POA: Insufficient documentation

## 2022-03-08 HISTORY — DX: Non-pressure chronic ulcer of back limited to breakdown of skin: L98.421

## 2022-03-08 NOTE — Assessment & Plan Note (Signed)
Incidental finding on chest CT during hospitalization: "Focal opacity measuring 1.8 cm in the posterior segment of the right upper lobe which may be infectious or inflammatory." -repeat imaging in 3 moths if appropriate

## 2022-03-08 NOTE — Assessment & Plan Note (Signed)
Ulcers only stage I at this time but I anticipate that we will get much worse if patient continues to have these episodes of incontinence and is immobile in her wheelchair without proper care.  I will see if we can reach out to home health who is already involved to see about wound care.

## 2022-03-08 NOTE — Assessment & Plan Note (Addendum)
BP elevated in office today, continues to be elevated on recheck. Will not change medications at this time as cardiology just changed Coreg to 12.5 mg BID and added amlodipine 5 mg twice daily today.  -Patient to follow-up in 1 week

## 2022-03-08 NOTE — Assessment & Plan Note (Signed)
Physical deconditioning, patient being in wheelchair, urinary and fecal incontinence together all make it very difficult for patient to care for self.  I strongly feel that patient should be in a SNF.  [name] very adamant that she is able to care for self. As difficult as it was too clean patient up today after her episode of incontinence, I cannot imagine her being able to do this on her own.  I anticipate sacral ulcers will get much worse if not cared for properly.

## 2022-03-08 NOTE — Assessment & Plan Note (Signed)
Patient states she will continue to take the Lasix 80 mg twice daily even if we advised her not to because she knows she needs it.  I did not see anything mentioned about Lasix and the cardiology note from earlier today.  I will reach out to cardiologist to see what they recommend.  I informed patient that I will not be prescribing her potassium today until her BMP returns.  She does not appear to be in heart failure exacerbation at this visit.  She does have BLE pitting edema but no JVD and is breathing comfortably on room air. -Follow-up on BMP -Follow-up on cardiology recommendations -Return in 1 week for follow-up

## 2022-03-08 NOTE — Assessment & Plan Note (Signed)
Attempted to provide therapeutic listening today.  Patient advised to call the number on her insurance card to find out what psychiatrists in the area are in network.

## 2022-03-08 NOTE — Assessment & Plan Note (Signed)
Likely due to diverticulosis.  No subsequent blood per rectum since hospitalization.  Will follow-up on CBC ordered earlier today.  ED/return precautions given.

## 2022-03-10 ENCOUNTER — Telehealth: Payer: Self-pay

## 2022-03-10 ENCOUNTER — Telehealth: Payer: Self-pay | Admitting: Student

## 2022-03-10 ENCOUNTER — Telehealth: Payer: Self-pay | Admitting: Internal Medicine

## 2022-03-10 DIAGNOSIS — Z9181 History of falling: Secondary | ICD-10-CM | POA: Diagnosis not present

## 2022-03-10 DIAGNOSIS — Z87891 Personal history of nicotine dependence: Secondary | ICD-10-CM | POA: Diagnosis not present

## 2022-03-10 DIAGNOSIS — J4489 Other specified chronic obstructive pulmonary disease: Secondary | ICD-10-CM | POA: Diagnosis not present

## 2022-03-10 DIAGNOSIS — N184 Chronic kidney disease, stage 4 (severe): Secondary | ICD-10-CM | POA: Diagnosis not present

## 2022-03-10 DIAGNOSIS — I2510 Atherosclerotic heart disease of native coronary artery without angina pectoris: Secondary | ICD-10-CM | POA: Diagnosis not present

## 2022-03-10 DIAGNOSIS — M542 Cervicalgia: Secondary | ICD-10-CM | POA: Diagnosis not present

## 2022-03-10 DIAGNOSIS — J439 Emphysema, unspecified: Secondary | ICD-10-CM | POA: Diagnosis not present

## 2022-03-10 DIAGNOSIS — I5033 Acute on chronic diastolic (congestive) heart failure: Secondary | ICD-10-CM | POA: Diagnosis not present

## 2022-03-10 DIAGNOSIS — E1142 Type 2 diabetes mellitus with diabetic polyneuropathy: Secondary | ICD-10-CM | POA: Diagnosis not present

## 2022-03-10 DIAGNOSIS — I130 Hypertensive heart and chronic kidney disease with heart failure and stage 1 through stage 4 chronic kidney disease, or unspecified chronic kidney disease: Secondary | ICD-10-CM | POA: Diagnosis not present

## 2022-03-10 DIAGNOSIS — K5730 Diverticulosis of large intestine without perforation or abscess without bleeding: Secondary | ICD-10-CM | POA: Diagnosis not present

## 2022-03-10 DIAGNOSIS — F32A Depression, unspecified: Secondary | ICD-10-CM | POA: Diagnosis not present

## 2022-03-10 DIAGNOSIS — Z7951 Long term (current) use of inhaled steroids: Secondary | ICD-10-CM | POA: Diagnosis not present

## 2022-03-10 DIAGNOSIS — J9611 Chronic respiratory failure with hypoxia: Secondary | ICD-10-CM | POA: Diagnosis not present

## 2022-03-10 DIAGNOSIS — E785 Hyperlipidemia, unspecified: Secondary | ICD-10-CM | POA: Diagnosis not present

## 2022-03-10 DIAGNOSIS — Z993 Dependence on wheelchair: Secondary | ICD-10-CM | POA: Diagnosis not present

## 2022-03-10 DIAGNOSIS — M1990 Unspecified osteoarthritis, unspecified site: Secondary | ICD-10-CM | POA: Diagnosis not present

## 2022-03-10 DIAGNOSIS — E1122 Type 2 diabetes mellitus with diabetic chronic kidney disease: Secondary | ICD-10-CM | POA: Diagnosis not present

## 2022-03-10 DIAGNOSIS — D631 Anemia in chronic kidney disease: Secondary | ICD-10-CM | POA: Diagnosis not present

## 2022-03-10 DIAGNOSIS — Z5982 Transportation insecurity: Secondary | ICD-10-CM | POA: Diagnosis not present

## 2022-03-10 DIAGNOSIS — D62 Acute posthemorrhagic anemia: Secondary | ICD-10-CM | POA: Diagnosis not present

## 2022-03-10 DIAGNOSIS — I252 Old myocardial infarction: Secondary | ICD-10-CM | POA: Diagnosis not present

## 2022-03-10 NOTE — Telephone Encounter (Signed)
I sent patients cardiologist a message about diuresis (Dr. Dorris Carnes) who stated she was unsure what dosage of lasix patient was taking but that she should continue diuresis with at least 80 mg daily. I believe pt is taking 80 mg BID. This should be confirmed at next visit if able.   Of note, Hgb and electrolytes stable as of 2/19.

## 2022-03-10 NOTE — Telephone Encounter (Signed)
Adrian Blackwater [facility] RN calls nurse line for medication management and verbal orders.   He reports clarification on continuing Hydralazine and Torsemide. He reports Hydralazine has been making her dizzy and therefore she has discontinued herself. I do see she mentioned this at recent office visit. I advised Adrian Blackwater we have reached out to Cardiology in regards to Torsemide. Cardiology number [facility].   He requests verbal orders for [place] as follows.   2x a week for 2 weeks 1x a week for 2 weeks "every other week"   Verbals given.

## 2022-03-10 NOTE — Telephone Encounter (Signed)
New Message:      Winsron from [employer] called. He needs to talk to a nurse, concerning patient's medicine. He is at the patient's home.

## 2022-03-10 NOTE — Telephone Encounter (Signed)
[place] with [facility] called to clarify if patient is supposed to be taking torsemide. When patient was discharged she did not have it on listed on her after visit summary to continue. Patient does have lower extremity edema and wants to continue the torsemide.  If she needs additional lab work, Adrian Blackwater with [facility] state they can draw labs if needed.   Reviewed with Dr Radford Pax (DOD) who has requested patient get stat BMET before she is able to advise on the Torsemide. Patient states she is unable to travel to the office.  [facility] nurse can draw the lab when he returns on Monday.   Advised the patient if she has worsening symptoms, swelling over the weekend to call EMS. Patient verbalized understanding and had no questions.

## 2022-03-12 DIAGNOSIS — I2510 Atherosclerotic heart disease of native coronary artery without angina pectoris: Secondary | ICD-10-CM | POA: Diagnosis not present

## 2022-03-12 DIAGNOSIS — E1122 Type 2 diabetes mellitus with diabetic chronic kidney disease: Secondary | ICD-10-CM | POA: Diagnosis not present

## 2022-03-12 DIAGNOSIS — E785 Hyperlipidemia, unspecified: Secondary | ICD-10-CM | POA: Diagnosis not present

## 2022-03-12 DIAGNOSIS — Z5982 Transportation insecurity: Secondary | ICD-10-CM | POA: Diagnosis not present

## 2022-03-12 DIAGNOSIS — J4489 Other specified chronic obstructive pulmonary disease: Secondary | ICD-10-CM | POA: Diagnosis not present

## 2022-03-12 DIAGNOSIS — I252 Old myocardial infarction: Secondary | ICD-10-CM | POA: Diagnosis not present

## 2022-03-12 DIAGNOSIS — I130 Hypertensive heart and chronic kidney disease with heart failure and stage 1 through stage 4 chronic kidney disease, or unspecified chronic kidney disease: Secondary | ICD-10-CM | POA: Diagnosis not present

## 2022-03-12 DIAGNOSIS — K5730 Diverticulosis of large intestine without perforation or abscess without bleeding: Secondary | ICD-10-CM | POA: Diagnosis not present

## 2022-03-12 DIAGNOSIS — M1990 Unspecified osteoarthritis, unspecified site: Secondary | ICD-10-CM | POA: Diagnosis not present

## 2022-03-12 DIAGNOSIS — F32A Depression, unspecified: Secondary | ICD-10-CM | POA: Diagnosis not present

## 2022-03-12 DIAGNOSIS — J9611 Chronic respiratory failure with hypoxia: Secondary | ICD-10-CM | POA: Diagnosis not present

## 2022-03-12 DIAGNOSIS — M542 Cervicalgia: Secondary | ICD-10-CM | POA: Diagnosis not present

## 2022-03-12 DIAGNOSIS — Z7951 Long term (current) use of inhaled steroids: Secondary | ICD-10-CM | POA: Diagnosis not present

## 2022-03-12 DIAGNOSIS — Z9181 History of falling: Secondary | ICD-10-CM | POA: Diagnosis not present

## 2022-03-12 DIAGNOSIS — I5033 Acute on chronic diastolic (congestive) heart failure: Secondary | ICD-10-CM | POA: Diagnosis not present

## 2022-03-12 DIAGNOSIS — Z87891 Personal history of nicotine dependence: Secondary | ICD-10-CM | POA: Diagnosis not present

## 2022-03-12 DIAGNOSIS — E1142 Type 2 diabetes mellitus with diabetic polyneuropathy: Secondary | ICD-10-CM | POA: Diagnosis not present

## 2022-03-12 DIAGNOSIS — J439 Emphysema, unspecified: Secondary | ICD-10-CM | POA: Diagnosis not present

## 2022-03-12 DIAGNOSIS — D631 Anemia in chronic kidney disease: Secondary | ICD-10-CM | POA: Diagnosis not present

## 2022-03-12 DIAGNOSIS — D62 Acute posthemorrhagic anemia: Secondary | ICD-10-CM | POA: Diagnosis not present

## 2022-03-12 DIAGNOSIS — N184 Chronic kidney disease, stage 4 (severe): Secondary | ICD-10-CM | POA: Diagnosis not present

## 2022-03-12 DIAGNOSIS — Z993 Dependence on wheelchair: Secondary | ICD-10-CM | POA: Diagnosis not present

## 2022-03-12 IMAGING — DX DG CHEST 2V
2 series · 2 of 2 positions shown · non-contrast
Comparison: Chest radiograph dated 10/22/2018.

CLINICAL DATA: 58-year-old female with shortness of breath.

EXAM:
CHEST - 2 VIEW

[chest lat]
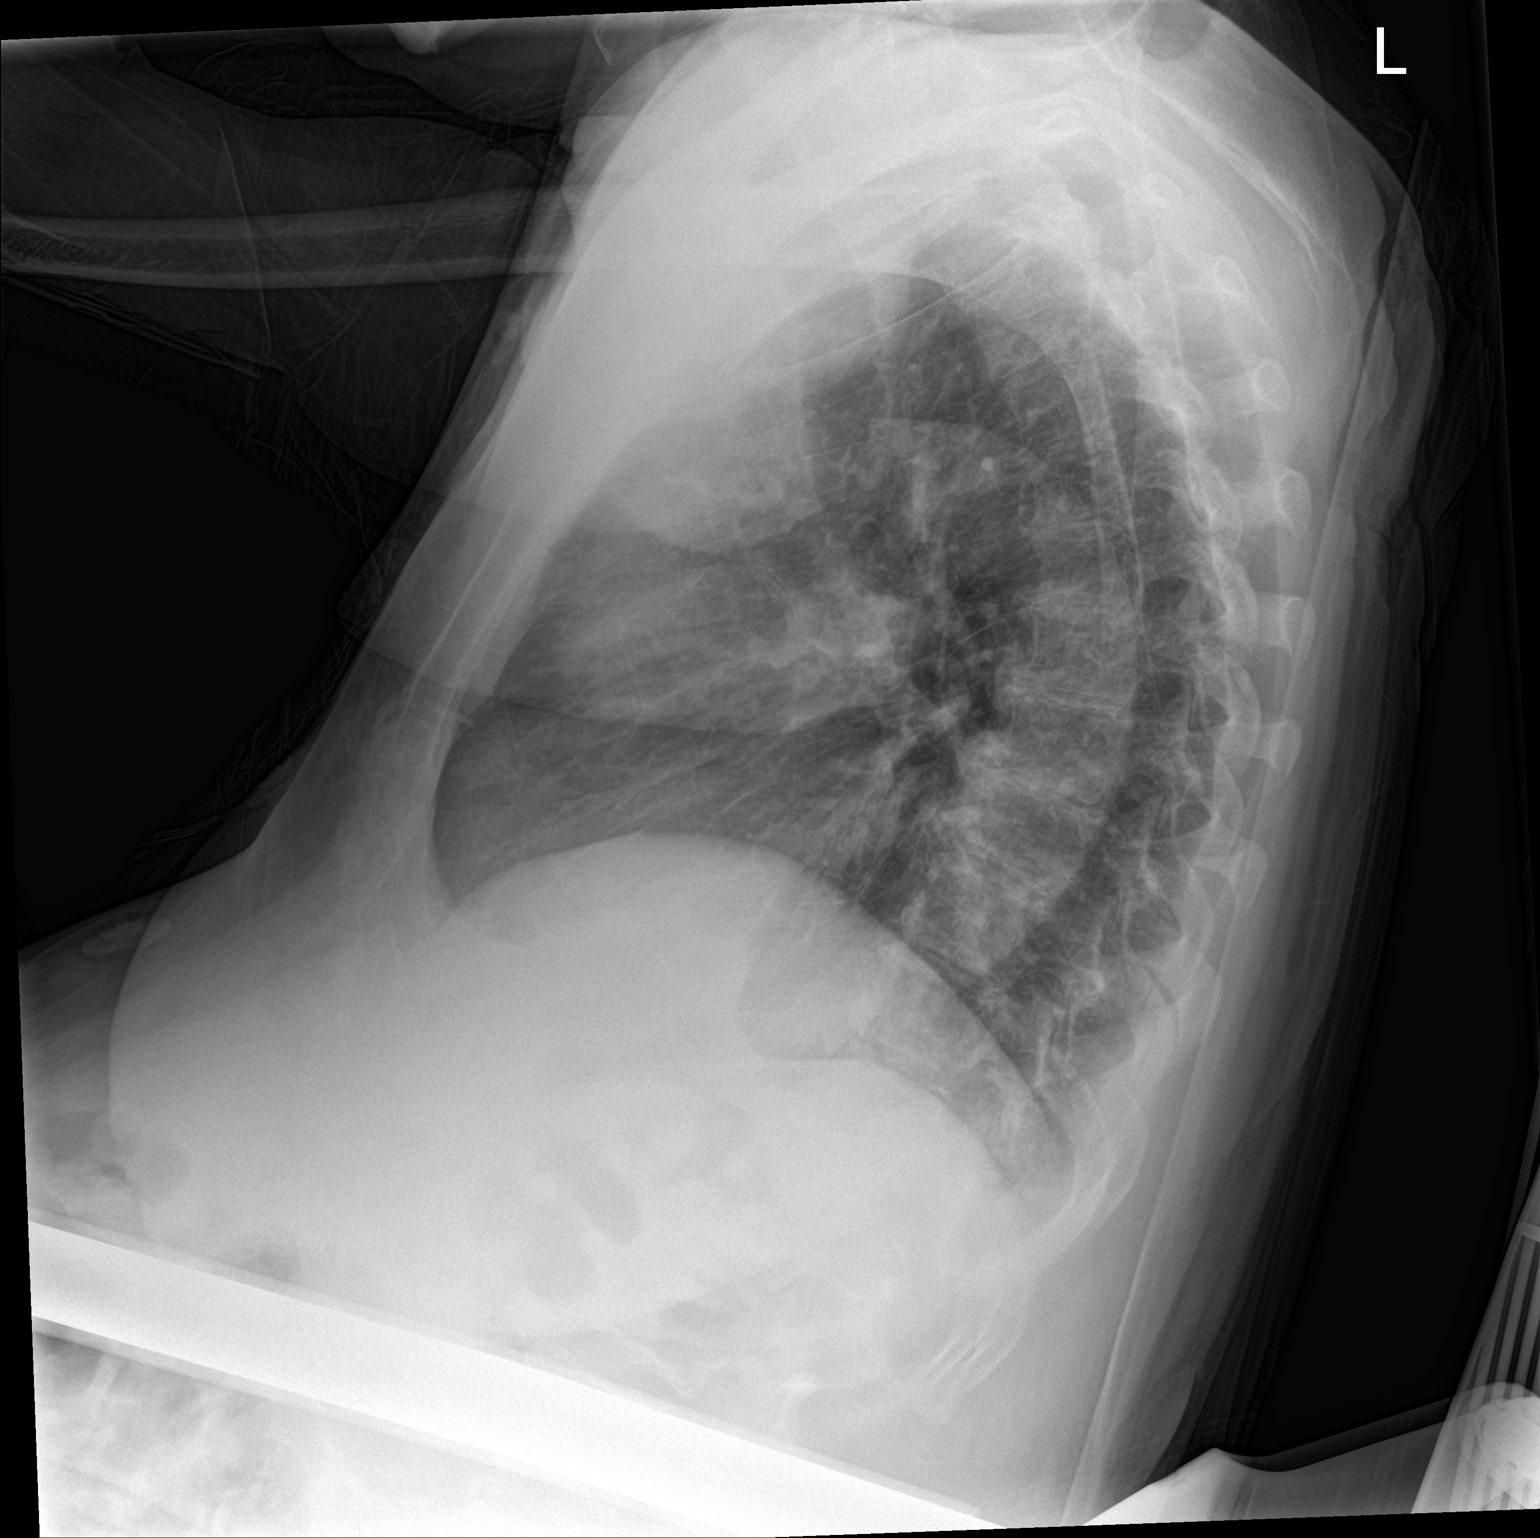

[chest ap]
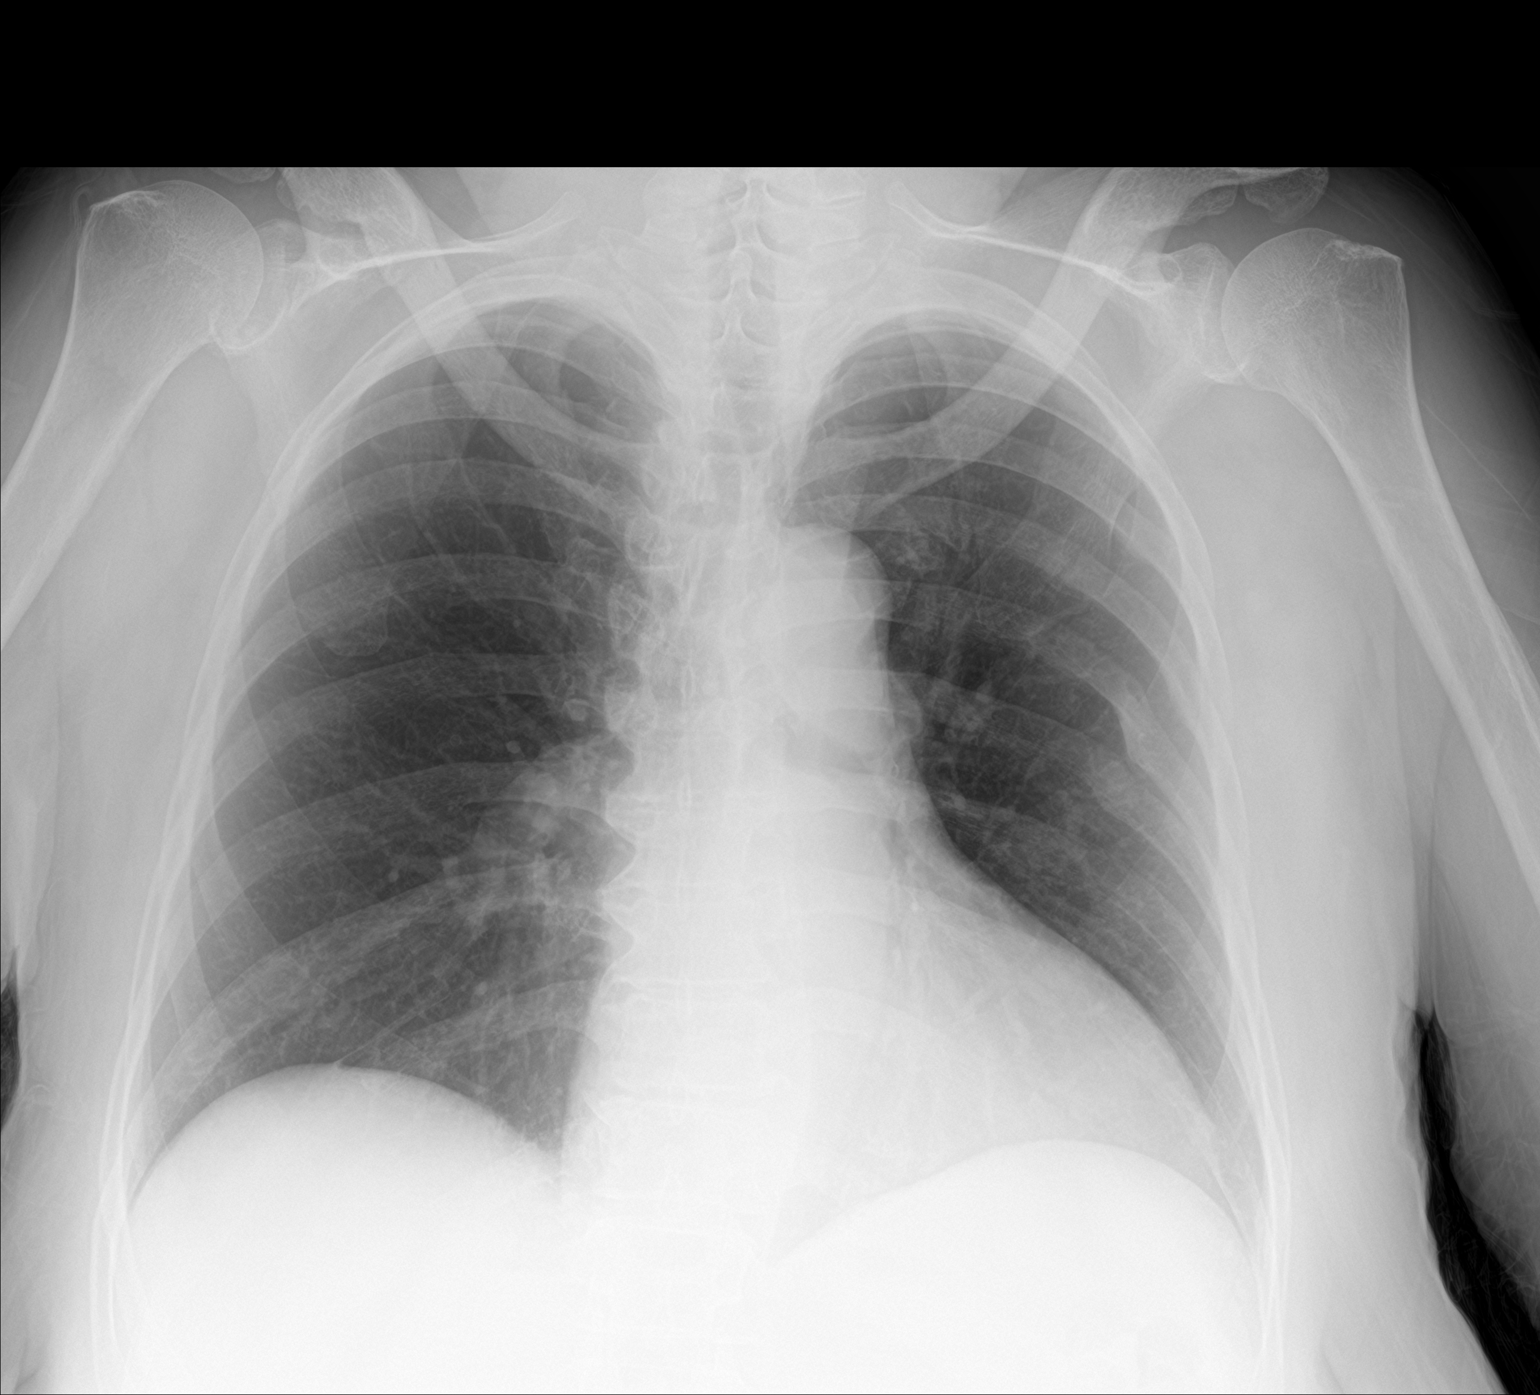

[2 of 2 positions shown; findings below may reference images not displayed]

FINDINGS: No focal consolidation, pleural effusion, pneumothorax. Mild
cardiomegaly. No acute osseous pathology. Old healed left posterior
rib fractures.
IMPRESSION: No active cardiopulmonary disease.

## 2022-03-13 ENCOUNTER — Telehealth: Payer: Self-pay | Admitting: Internal Medicine

## 2022-03-13 DIAGNOSIS — K5730 Diverticulosis of large intestine without perforation or abscess without bleeding: Secondary | ICD-10-CM | POA: Diagnosis not present

## 2022-03-13 DIAGNOSIS — E1122 Type 2 diabetes mellitus with diabetic chronic kidney disease: Secondary | ICD-10-CM | POA: Diagnosis not present

## 2022-03-13 DIAGNOSIS — J4489 Other specified chronic obstructive pulmonary disease: Secondary | ICD-10-CM | POA: Diagnosis not present

## 2022-03-13 DIAGNOSIS — D62 Acute posthemorrhagic anemia: Secondary | ICD-10-CM | POA: Diagnosis not present

## 2022-03-13 DIAGNOSIS — E785 Hyperlipidemia, unspecified: Secondary | ICD-10-CM | POA: Diagnosis not present

## 2022-03-13 DIAGNOSIS — N184 Chronic kidney disease, stage 4 (severe): Secondary | ICD-10-CM | POA: Diagnosis not present

## 2022-03-13 DIAGNOSIS — M542 Cervicalgia: Secondary | ICD-10-CM | POA: Diagnosis not present

## 2022-03-13 DIAGNOSIS — I130 Hypertensive heart and chronic kidney disease with heart failure and stage 1 through stage 4 chronic kidney disease, or unspecified chronic kidney disease: Secondary | ICD-10-CM | POA: Diagnosis not present

## 2022-03-13 DIAGNOSIS — M1990 Unspecified osteoarthritis, unspecified site: Secondary | ICD-10-CM | POA: Diagnosis not present

## 2022-03-13 DIAGNOSIS — J439 Emphysema, unspecified: Secondary | ICD-10-CM | POA: Diagnosis not present

## 2022-03-13 DIAGNOSIS — F32A Depression, unspecified: Secondary | ICD-10-CM | POA: Diagnosis not present

## 2022-03-13 DIAGNOSIS — Z87891 Personal history of nicotine dependence: Secondary | ICD-10-CM | POA: Diagnosis not present

## 2022-03-13 DIAGNOSIS — Z993 Dependence on wheelchair: Secondary | ICD-10-CM | POA: Diagnosis not present

## 2022-03-13 DIAGNOSIS — I5033 Acute on chronic diastolic (congestive) heart failure: Secondary | ICD-10-CM | POA: Diagnosis not present

## 2022-03-13 DIAGNOSIS — I2510 Atherosclerotic heart disease of native coronary artery without angina pectoris: Secondary | ICD-10-CM | POA: Diagnosis not present

## 2022-03-13 DIAGNOSIS — I252 Old myocardial infarction: Secondary | ICD-10-CM | POA: Diagnosis not present

## 2022-03-13 DIAGNOSIS — I509 Heart failure, unspecified: Secondary | ICD-10-CM | POA: Diagnosis not present

## 2022-03-13 DIAGNOSIS — Z5982 Transportation insecurity: Secondary | ICD-10-CM | POA: Diagnosis not present

## 2022-03-13 DIAGNOSIS — Z9181 History of falling: Secondary | ICD-10-CM | POA: Diagnosis not present

## 2022-03-13 DIAGNOSIS — Z7951 Long term (current) use of inhaled steroids: Secondary | ICD-10-CM | POA: Diagnosis not present

## 2022-03-13 DIAGNOSIS — D631 Anemia in chronic kidney disease: Secondary | ICD-10-CM | POA: Diagnosis not present

## 2022-03-13 DIAGNOSIS — J9611 Chronic respiratory failure with hypoxia: Secondary | ICD-10-CM | POA: Diagnosis not present

## 2022-03-13 DIAGNOSIS — E1142 Type 2 diabetes mellitus with diabetic polyneuropathy: Secondary | ICD-10-CM | POA: Diagnosis not present

## 2022-03-13 NOTE — Telephone Encounter (Signed)
Patient calls nurse line reporting bilateral leg swelling.   She reports she fell twice over the weekend due to the swelling. She reports she had some "fluid pills" at home and took some of those. [name] unsure the dosage. However, she reports the swelling went down. She reports she is doing "ok" now. Denies any SOB or chest pains.   She reports the home health nurse is coming by today. They should call with any abnormal findings.   I confirmed apt with [facility] tomorrow and advised to bring all home medications.

## 2022-03-13 NOTE — Telephone Encounter (Signed)
[facility] calling to inform Dr. Harrington Challenger that he dropped off a BMP to [facility] to check pt's creatinine.

## 2022-03-13 NOTE — Telephone Encounter (Signed)
Not sure where this lab result will be found

## 2022-03-13 NOTE — Telephone Encounter (Signed)
See other open encounter... will try to retrieve the BMET done by home health.

## 2022-03-14 ENCOUNTER — Encounter: Payer: Self-pay | Admitting: Family Medicine

## 2022-03-14 ENCOUNTER — Ambulatory Visit: Payer: 59 | Admitting: Family Medicine

## 2022-03-14 ENCOUNTER — Ambulatory Visit (INDEPENDENT_AMBULATORY_CARE_PROVIDER_SITE_OTHER): Payer: 59 | Admitting: Family Medicine

## 2022-03-14 VITALS — BP 118/73 | HR 57

## 2022-03-14 DIAGNOSIS — I5032 Chronic diastolic (congestive) heart failure: Secondary | ICD-10-CM

## 2022-03-14 DIAGNOSIS — I10 Essential (primary) hypertension: Secondary | ICD-10-CM

## 2022-03-14 NOTE — Assessment & Plan Note (Signed)
-  BP 118/73, at goal -continue amlodipine and coreg, no changes to antihypertensive regimen at this time

## 2022-03-14 NOTE — Patient Instructions (Signed)
It was great seeing you today!  Today we discussed your blood pressure and leg swelling, both of these are doing better! Please continue to take the torsemide 20 mg twice daily. Your blood pressure looks great as well, continue your amlodipine and coreg as well. I will not make any changes today since you are doing great!  Please follow up at your next scheduled appointment on 3/4 at 1:30pm, if anything arises between now and then, please don't hesitate to contact our office.   Thank you for allowing [name] to be a part of your medical care!  Thank you, Dr. Larae Grooms  Also a reminder of our clinic's no-show policy. Please make sure to arrive at least 15 minutes prior to your scheduled appointment time. Please try to cancel before 24 hours if you are not able to make it. If you no-show for 2 appointments then you will be receiving a warning letter. If you no-show after 3 visits, then you may be at risk of being dismissed from our clinic. This is to ensure that everyone is able to be seen in a timely manner. Thank you, we appreciate your assistance with this!

## 2022-03-14 NOTE — Assessment & Plan Note (Addendum)
-  reassuringly patient's volume status has significantly improved, she has much less low edema and no signs of anasarca to suggest worsening volume status -continue 1.5L fluid restriction, encouraged hydrated with water and limiting other beverage options -continue torsemide 20 mg daily -follow up with cardiologist 3/25 -follow up scheduled with PCP 3/4, consider repeat BMP at this time

## 2022-03-14 NOTE — Progress Notes (Signed)
    SUBJECTIVE:   CHIEF COMPLAINT / HPI:   Patient with history of chronic diastolic HF and hypertension presents for follow up. Reports that she is doing well and her swelling her gone down significantly since during her hospitalization. She tells me that her legs were so swollen prior to her hospitalization that they were weeping. She saw her cardiology, Dr. Harrington Challenger, but she says she will discuss with their office to get her notes faxed her. Had been taking lasix 80 mg bid initially after her hospitalization per prior provider note but patient says that she has not taken that in awhile. Currently taking torsemide 20 mg bid which she reports has been working well for her. She sometimes experiences some tingling but otherwise has been doing much better. Denies any chest pain, dyspnea or worsening leg swelling. Compliant on her amlodipine 10 mg daily and coreg 12.5 mg bid. Says that she sometimes has systolic BP in the [id number] at times but mostly is within normal range as it is today.   OBJECTIVE:   BP 118/73   Pulse (!) 57   SpO2 99%   General: Patient well-appearing, in no acute distress. CV: RRR, no murmurs or gallops auscultated Resp: CTAB, no wheezing, rales or rhonchi noted Ext: less than 1+ pitting edema noted bilaterally Neuro: utilizes wheelchair for optimal mobility   ASSESSMENT/PLAN:   Chronic diastolic congestive heart failure (HCC) -reassuringly patient's volume status has significantly improved, she has much less low edema and no signs of anasarca to suggest worsening volume status -continue 1.5L fluid restriction, encouraged hydrated with water and limiting other beverage options -continue torsemide 20 mg daily -follow up with cardiologist 3/25 -follow up scheduled with PCP 3/4, consider repeat BMP at this time  Essential hypertension -BP 118/73, at goal -continue amlodipine and coreg, no changes to antihypertensive regimen at this time   -Med rec reviewed and updated  appropriately, was able to go through medications one by one since patient brought them into her visit. -PHQ-9 score of 0 reviewed.   Donney Dice, [place]

## 2022-03-15 ENCOUNTER — Other Ambulatory Visit: Payer: Self-pay | Admitting: Family Medicine

## 2022-03-15 NOTE — Telephone Encounter (Signed)
Requested by interface surescripts. Patient no longer under Dr. Margarita Rana care.  Requested Prescriptions  Refused Prescriptions Disp Refills   potassium chloride SA (KLOR-CON M) 20 MEQ tablet [Pharmacy Med Name: POTASSIUM CL 20MEQ ER TABLETS] 30 tablet 6    Sig: TAKE 1 TABLET(20 MEQ) BY MOUTH DAILY     Endocrinology:  Minerals - Potassium Supplementation Failed - 03/15/2022  9:07 AM      Failed - Cr in normal range and within 360 days    Creat  Date Value Ref Range Status  02/18/2016 1.22 (H) 0.50 - 1.05 mg/dL Final    Comment:      For patients > or = 63 years of age: The upper reference limit for Creatinine is approximately 13% higher for people identified as African-American.      Creatinine, Ser  Date Value Ref Range Status  03/06/2022 2.73 (H) 0.44 - 1.00 mg/dL Final   Creatinine,U  Date Value Ref Range Status  06/17/2012 521.94 mg/dL Final    Comment:    Result confirmed by automatic dilution.    Cutoff Values for Urine Drug Screen, Pain Mgmt          Drug Class           Cutoff (ng/mL)          Amphetamines             500          Barbiturates             200          Cocaine Metabolites      150          Benzodiazepines          200          Methadone                300          Opiates                  300          Phencyclidine             25          Propoxyphene             300          Marijuana Metabolites     50    For medical purposes only.   Creatinine, Urine  Date Value Ref Range Status  02/19/2020 300.84 mg/dL Final    Comment:    Performed at [facility], [place] [address] [place], [place] [address]         Passed - K in normal range and within 360 days    Potassium  Date Value Ref Range Status  03/06/2022 4.1 3.5 - 5.1 mmol/L Final         Passed - Valid encounter within last 12 months    Recent Outpatient Visits           8 months ago Frequent falls   [place], Enobong, MD    11 months ago Lumbar radiculopathy   [place] [name], MD   11 months ago Weakness of both lower extremities   [place], Enobong, MD   1 year ago Edema of both legs   [place]  [place] Waller, Vernia Buff, NP   1 year ago Hospital discharge follow-up   [facility] Gildardo Pounds, NP       Future Appointments             In 5 days Leslie Dales, [place], [facility]   In 3 weeks [name], MD [place] at [facility], [facility]             potassium chloride SA (KLOR-CON M) 20 MEQ tablet [Pharmacy Med Name: POTASSIUM CL 20MEQ ER TABLETS] 180 tablet 3    Sig: TAKE 2 TABLETS BY MOUTH TWICE DAILY     Endocrinology:  Minerals - Potassium Supplementation Failed - 03/15/2022  9:07 AM      Failed - Cr in normal range and within 360 days    Creat  Date Value Ref Range Status  02/18/2016 1.22 (H) 0.50 - 1.05 mg/dL Final    Comment:      For patients > or = 63 years of age: The upper reference limit for Creatinine is approximately 13% higher for people identified as African-American.      Creatinine, Ser  Date Value Ref Range Status  03/06/2022 2.73 (H) 0.44 - 1.00 mg/dL Final   Creatinine,U  Date Value Ref Range Status  06/17/2012 521.94 mg/dL Final    Comment:    Result confirmed by automatic dilution.    Cutoff Values for Urine Drug Screen, Pain Mgmt          Drug Class           Cutoff (ng/mL)          Amphetamines             500          Barbiturates             200          Cocaine Metabolites      150          Benzodiazepines          200          Methadone                300          Opiates                  300          Phencyclidine             25          Propoxyphene             300          Marijuana Metabolites     50    For medical purposes only.    Creatinine, Urine  Date Value Ref Range Status  02/19/2020 300.84 mg/dL Final    Comment:    Performed at [facility], [place] [address], [place], [place] [address]         Passed - K in normal range and within 360 days    Potassium  Date Value Ref Range Status  03/06/2022 4.1 3.5 - 5.1 mmol/L Final         Passed - Valid encounter within last 12 months    Recent Outpatient Visits           8 months ago Frequent falls   [facility]  [place] [name], MD   11 months ago Lumbar radiculopathy   [place], Enobong, MD   11 months ago Weakness of both lower extremities   [place], Enobong, MD   1 year ago Edema of both legs   [place] Gildardo Pounds, NP   1 year ago Hospital discharge follow-up   [facility] Gildardo Pounds, NP       Future Appointments             In 5 days Leslie Dales, [place], [facility]   In 3 weeks [name], MD [place] at [facility], [place]

## 2022-03-16 NOTE — Telephone Encounter (Signed)
I spoke with  Dian Situ RN [phone]   He says he drew a BMET on the pt but we have not received the result... he will fax it to our [place]... I advised him that we are waiting to have other labs that were also ordered..... CBC, LIPID, AND PRO BNP.... he will draw them at her visit tomorrow and fax to Dr Harrington Challenger asap.   I called the pt to see how she is doing and to see how her swelling was but I had to leave a message.   Will forward to Dr Harrington Challenger to alert her about the pts labwork.

## 2022-03-16 NOTE — Telephone Encounter (Signed)
BMET received having scanned to chart for Dr Harrington Challenger review.     Pt saw her PCP this week: ([id number]) Chronic diastolic congestive heart failure ([place]) -reassuringly patient's volume status has significantly improved, she has much less low edema and no signs of anasarca to suggest worsening volume status -continue 1.5L fluid restriction, encouraged hydrated with water and limiting other beverage options -continue torsemide 20 mg daily -follow up with cardiologist 3/25 -follow up scheduled with PCP 3/4, consider repeat BMP at this time   Essential hypertension -BP 118/73, at goal -continue amlodipine and coreg, no changes to antihypertensive regimen at this time

## 2022-03-17 DIAGNOSIS — E1122 Type 2 diabetes mellitus with diabetic chronic kidney disease: Secondary | ICD-10-CM | POA: Diagnosis not present

## 2022-03-17 DIAGNOSIS — K5730 Diverticulosis of large intestine without perforation or abscess without bleeding: Secondary | ICD-10-CM | POA: Diagnosis not present

## 2022-03-17 DIAGNOSIS — D631 Anemia in chronic kidney disease: Secondary | ICD-10-CM | POA: Diagnosis not present

## 2022-03-17 DIAGNOSIS — M170 Bilateral primary osteoarthritis of knee: Secondary | ICD-10-CM | POA: Diagnosis not present

## 2022-03-17 DIAGNOSIS — J439 Emphysema, unspecified: Secondary | ICD-10-CM | POA: Diagnosis not present

## 2022-03-17 DIAGNOSIS — J4489 Other specified chronic obstructive pulmonary disease: Secondary | ICD-10-CM | POA: Diagnosis not present

## 2022-03-17 DIAGNOSIS — Z9181 History of falling: Secondary | ICD-10-CM | POA: Diagnosis not present

## 2022-03-17 DIAGNOSIS — Z5982 Transportation insecurity: Secondary | ICD-10-CM | POA: Diagnosis not present

## 2022-03-17 DIAGNOSIS — J9611 Chronic respiratory failure with hypoxia: Secondary | ICD-10-CM | POA: Diagnosis not present

## 2022-03-17 DIAGNOSIS — Z993 Dependence on wheelchair: Secondary | ICD-10-CM | POA: Diagnosis not present

## 2022-03-17 DIAGNOSIS — F32A Depression, unspecified: Secondary | ICD-10-CM | POA: Diagnosis not present

## 2022-03-17 DIAGNOSIS — I130 Hypertensive heart and chronic kidney disease with heart failure and stage 1 through stage 4 chronic kidney disease, or unspecified chronic kidney disease: Secondary | ICD-10-CM | POA: Diagnosis not present

## 2022-03-17 DIAGNOSIS — M1990 Unspecified osteoarthritis, unspecified site: Secondary | ICD-10-CM | POA: Diagnosis not present

## 2022-03-17 DIAGNOSIS — M542 Cervicalgia: Secondary | ICD-10-CM | POA: Diagnosis not present

## 2022-03-17 DIAGNOSIS — Z87891 Personal history of nicotine dependence: Secondary | ICD-10-CM | POA: Diagnosis not present

## 2022-03-17 DIAGNOSIS — I2510 Atherosclerotic heart disease of native coronary artery without angina pectoris: Secondary | ICD-10-CM | POA: Diagnosis not present

## 2022-03-17 DIAGNOSIS — E1142 Type 2 diabetes mellitus with diabetic polyneuropathy: Secondary | ICD-10-CM | POA: Diagnosis not present

## 2022-03-17 DIAGNOSIS — D62 Acute posthemorrhagic anemia: Secondary | ICD-10-CM | POA: Diagnosis not present

## 2022-03-17 DIAGNOSIS — N184 Chronic kidney disease, stage 4 (severe): Secondary | ICD-10-CM | POA: Diagnosis not present

## 2022-03-17 DIAGNOSIS — E785 Hyperlipidemia, unspecified: Secondary | ICD-10-CM | POA: Diagnosis not present

## 2022-03-17 DIAGNOSIS — I5033 Acute on chronic diastolic (congestive) heart failure: Secondary | ICD-10-CM | POA: Diagnosis not present

## 2022-03-17 DIAGNOSIS — Z7951 Long term (current) use of inhaled steroids: Secondary | ICD-10-CM | POA: Diagnosis not present

## 2022-03-17 DIAGNOSIS — I252 Old myocardial infarction: Secondary | ICD-10-CM | POA: Diagnosis not present

## 2022-03-19 ENCOUNTER — Other Ambulatory Visit: Payer: Self-pay | Admitting: Student

## 2022-03-20 ENCOUNTER — Ambulatory Visit: Payer: Self-pay | Admitting: Student

## 2022-03-21 DIAGNOSIS — E1122 Type 2 diabetes mellitus with diabetic chronic kidney disease: Secondary | ICD-10-CM | POA: Diagnosis not present

## 2022-03-21 DIAGNOSIS — I130 Hypertensive heart and chronic kidney disease with heart failure and stage 1 through stage 4 chronic kidney disease, or unspecified chronic kidney disease: Secondary | ICD-10-CM | POA: Diagnosis not present

## 2022-03-21 DIAGNOSIS — F32A Depression, unspecified: Secondary | ICD-10-CM | POA: Diagnosis not present

## 2022-03-21 DIAGNOSIS — D62 Acute posthemorrhagic anemia: Secondary | ICD-10-CM | POA: Diagnosis not present

## 2022-03-21 DIAGNOSIS — I2510 Atherosclerotic heart disease of native coronary artery without angina pectoris: Secondary | ICD-10-CM | POA: Diagnosis not present

## 2022-03-21 DIAGNOSIS — D631 Anemia in chronic kidney disease: Secondary | ICD-10-CM | POA: Diagnosis not present

## 2022-03-21 DIAGNOSIS — J9611 Chronic respiratory failure with hypoxia: Secondary | ICD-10-CM | POA: Diagnosis not present

## 2022-03-21 DIAGNOSIS — I5033 Acute on chronic diastolic (congestive) heart failure: Secondary | ICD-10-CM | POA: Diagnosis not present

## 2022-03-21 DIAGNOSIS — E1142 Type 2 diabetes mellitus with diabetic polyneuropathy: Secondary | ICD-10-CM | POA: Diagnosis not present

## 2022-03-21 DIAGNOSIS — Z87891 Personal history of nicotine dependence: Secondary | ICD-10-CM | POA: Diagnosis not present

## 2022-03-21 DIAGNOSIS — Z993 Dependence on wheelchair: Secondary | ICD-10-CM | POA: Diagnosis not present

## 2022-03-21 DIAGNOSIS — K5730 Diverticulosis of large intestine without perforation or abscess without bleeding: Secondary | ICD-10-CM | POA: Diagnosis not present

## 2022-03-21 DIAGNOSIS — Z9181 History of falling: Secondary | ICD-10-CM | POA: Diagnosis not present

## 2022-03-21 DIAGNOSIS — N184 Chronic kidney disease, stage 4 (severe): Secondary | ICD-10-CM | POA: Diagnosis not present

## 2022-03-21 DIAGNOSIS — E785 Hyperlipidemia, unspecified: Secondary | ICD-10-CM | POA: Diagnosis not present

## 2022-03-21 DIAGNOSIS — J4489 Other specified chronic obstructive pulmonary disease: Secondary | ICD-10-CM | POA: Diagnosis not present

## 2022-03-21 DIAGNOSIS — J439 Emphysema, unspecified: Secondary | ICD-10-CM | POA: Diagnosis not present

## 2022-03-21 DIAGNOSIS — Z5982 Transportation insecurity: Secondary | ICD-10-CM | POA: Diagnosis not present

## 2022-03-21 DIAGNOSIS — Z7951 Long term (current) use of inhaled steroids: Secondary | ICD-10-CM | POA: Diagnosis not present

## 2022-03-21 DIAGNOSIS — M1990 Unspecified osteoarthritis, unspecified site: Secondary | ICD-10-CM | POA: Diagnosis not present

## 2022-03-21 DIAGNOSIS — I252 Old myocardial infarction: Secondary | ICD-10-CM | POA: Diagnosis not present

## 2022-03-21 DIAGNOSIS — M542 Cervicalgia: Secondary | ICD-10-CM | POA: Diagnosis not present

## 2022-03-21 NOTE — Telephone Encounter (Signed)
Kidney function is relatively stable  Keep on same meds   Follow up in 6 wks

## 2022-03-23 DIAGNOSIS — I5033 Acute on chronic diastolic (congestive) heart failure: Secondary | ICD-10-CM | POA: Diagnosis not present

## 2022-03-23 DIAGNOSIS — F32A Depression, unspecified: Secondary | ICD-10-CM | POA: Diagnosis not present

## 2022-03-23 DIAGNOSIS — M542 Cervicalgia: Secondary | ICD-10-CM | POA: Diagnosis not present

## 2022-03-23 DIAGNOSIS — E785 Hyperlipidemia, unspecified: Secondary | ICD-10-CM | POA: Diagnosis not present

## 2022-03-23 DIAGNOSIS — E1142 Type 2 diabetes mellitus with diabetic polyneuropathy: Secondary | ICD-10-CM | POA: Diagnosis not present

## 2022-03-23 DIAGNOSIS — J4489 Other specified chronic obstructive pulmonary disease: Secondary | ICD-10-CM | POA: Diagnosis not present

## 2022-03-23 DIAGNOSIS — I2510 Atherosclerotic heart disease of native coronary artery without angina pectoris: Secondary | ICD-10-CM | POA: Diagnosis not present

## 2022-03-23 DIAGNOSIS — M1990 Unspecified osteoarthritis, unspecified site: Secondary | ICD-10-CM | POA: Diagnosis not present

## 2022-03-23 DIAGNOSIS — E1122 Type 2 diabetes mellitus with diabetic chronic kidney disease: Secondary | ICD-10-CM | POA: Diagnosis not present

## 2022-03-23 DIAGNOSIS — I130 Hypertensive heart and chronic kidney disease with heart failure and stage 1 through stage 4 chronic kidney disease, or unspecified chronic kidney disease: Secondary | ICD-10-CM | POA: Diagnosis not present

## 2022-03-23 DIAGNOSIS — N184 Chronic kidney disease, stage 4 (severe): Secondary | ICD-10-CM | POA: Diagnosis not present

## 2022-03-23 DIAGNOSIS — Z5982 Transportation insecurity: Secondary | ICD-10-CM | POA: Diagnosis not present

## 2022-03-23 DIAGNOSIS — D62 Acute posthemorrhagic anemia: Secondary | ICD-10-CM | POA: Diagnosis not present

## 2022-03-23 DIAGNOSIS — Z87891 Personal history of nicotine dependence: Secondary | ICD-10-CM | POA: Diagnosis not present

## 2022-03-23 DIAGNOSIS — J439 Emphysema, unspecified: Secondary | ICD-10-CM | POA: Diagnosis not present

## 2022-03-23 DIAGNOSIS — I252 Old myocardial infarction: Secondary | ICD-10-CM | POA: Diagnosis not present

## 2022-03-23 DIAGNOSIS — Z7951 Long term (current) use of inhaled steroids: Secondary | ICD-10-CM | POA: Diagnosis not present

## 2022-03-23 DIAGNOSIS — Z993 Dependence on wheelchair: Secondary | ICD-10-CM | POA: Diagnosis not present

## 2022-03-23 DIAGNOSIS — D631 Anemia in chronic kidney disease: Secondary | ICD-10-CM | POA: Diagnosis not present

## 2022-03-23 DIAGNOSIS — Z9181 History of falling: Secondary | ICD-10-CM | POA: Diagnosis not present

## 2022-03-23 DIAGNOSIS — J9611 Chronic respiratory failure with hypoxia: Secondary | ICD-10-CM | POA: Diagnosis not present

## 2022-03-23 DIAGNOSIS — K5730 Diverticulosis of large intestine without perforation or abscess without bleeding: Secondary | ICD-10-CM | POA: Diagnosis not present

## 2022-03-23 NOTE — Telephone Encounter (Signed)
Spoke with the patient and gave advisement from Dr. Harrington Challenger. Patient verbalized understanding.

## 2022-03-25 DIAGNOSIS — G4733 Obstructive sleep apnea (adult) (pediatric): Secondary | ICD-10-CM | POA: Diagnosis not present

## 2022-03-25 DIAGNOSIS — R062 Wheezing: Secondary | ICD-10-CM | POA: Diagnosis not present

## 2022-03-25 DIAGNOSIS — J45909 Unspecified asthma, uncomplicated: Secondary | ICD-10-CM | POA: Diagnosis not present

## 2022-03-27 ENCOUNTER — Telehealth: Payer: Self-pay | Admitting: Internal Medicine

## 2022-03-27 DIAGNOSIS — E7849 Other hyperlipidemia: Secondary | ICD-10-CM

## 2022-03-27 DIAGNOSIS — Z79899 Other long term (current) drug therapy: Secondary | ICD-10-CM

## 2022-03-27 NOTE — Telephone Encounter (Signed)
Patient had labs recnetly     LDL 82   On statin   With vascluar plaquing LDL should be lowewr    I would add Zetia to regimen  Check lipids and liver panel in 8 wks

## 2022-03-28 ENCOUNTER — Ambulatory Visit: Payer: 59 | Admitting: Family Medicine

## 2022-03-29 ENCOUNTER — Other Ambulatory Visit: Payer: Self-pay

## 2022-03-29 DIAGNOSIS — D62 Acute posthemorrhagic anemia: Secondary | ICD-10-CM | POA: Diagnosis not present

## 2022-03-29 DIAGNOSIS — J4489 Other specified chronic obstructive pulmonary disease: Secondary | ICD-10-CM | POA: Diagnosis not present

## 2022-03-29 DIAGNOSIS — E785 Hyperlipidemia, unspecified: Secondary | ICD-10-CM | POA: Diagnosis not present

## 2022-03-29 DIAGNOSIS — F32A Depression, unspecified: Secondary | ICD-10-CM | POA: Diagnosis not present

## 2022-03-29 DIAGNOSIS — M542 Cervicalgia: Secondary | ICD-10-CM | POA: Diagnosis not present

## 2022-03-29 DIAGNOSIS — I2510 Atherosclerotic heart disease of native coronary artery without angina pectoris: Secondary | ICD-10-CM | POA: Diagnosis not present

## 2022-03-29 DIAGNOSIS — N184 Chronic kidney disease, stage 4 (severe): Secondary | ICD-10-CM | POA: Diagnosis not present

## 2022-03-29 DIAGNOSIS — E1122 Type 2 diabetes mellitus with diabetic chronic kidney disease: Secondary | ICD-10-CM | POA: Diagnosis not present

## 2022-03-29 DIAGNOSIS — K5730 Diverticulosis of large intestine without perforation or abscess without bleeding: Secondary | ICD-10-CM | POA: Diagnosis not present

## 2022-03-29 DIAGNOSIS — D631 Anemia in chronic kidney disease: Secondary | ICD-10-CM | POA: Diagnosis not present

## 2022-03-29 DIAGNOSIS — J9611 Chronic respiratory failure with hypoxia: Secondary | ICD-10-CM | POA: Diagnosis not present

## 2022-03-29 DIAGNOSIS — I5033 Acute on chronic diastolic (congestive) heart failure: Secondary | ICD-10-CM | POA: Diagnosis not present

## 2022-03-29 DIAGNOSIS — I252 Old myocardial infarction: Secondary | ICD-10-CM | POA: Diagnosis not present

## 2022-03-29 DIAGNOSIS — M1990 Unspecified osteoarthritis, unspecified site: Secondary | ICD-10-CM | POA: Diagnosis not present

## 2022-03-29 DIAGNOSIS — Z7951 Long term (current) use of inhaled steroids: Secondary | ICD-10-CM | POA: Diagnosis not present

## 2022-03-29 DIAGNOSIS — Z993 Dependence on wheelchair: Secondary | ICD-10-CM | POA: Diagnosis not present

## 2022-03-29 DIAGNOSIS — Z87891 Personal history of nicotine dependence: Secondary | ICD-10-CM | POA: Diagnosis not present

## 2022-03-29 DIAGNOSIS — Z9181 History of falling: Secondary | ICD-10-CM | POA: Diagnosis not present

## 2022-03-29 DIAGNOSIS — J439 Emphysema, unspecified: Secondary | ICD-10-CM | POA: Diagnosis not present

## 2022-03-29 DIAGNOSIS — E1142 Type 2 diabetes mellitus with diabetic polyneuropathy: Secondary | ICD-10-CM | POA: Diagnosis not present

## 2022-03-29 DIAGNOSIS — Z5982 Transportation insecurity: Secondary | ICD-10-CM | POA: Diagnosis not present

## 2022-03-29 DIAGNOSIS — I130 Hypertensive heart and chronic kidney disease with heart failure and stage 1 through stage 4 chronic kidney disease, or unspecified chronic kidney disease: Secondary | ICD-10-CM | POA: Diagnosis not present

## 2022-03-29 NOTE — Telephone Encounter (Signed)
Left a message foe the pt to call back... pt has OV 04/10/22.

## 2022-03-30 ENCOUNTER — Telehealth: Payer: Self-pay

## 2022-03-30 ENCOUNTER — Encounter: Payer: Self-pay | Admitting: Internal Medicine

## 2022-03-30 MED ORDER — EZETIMIBE 10 MG PO TABS: 10.0000 mg | ORAL_TABLET | Freq: Every day | ORAL | 3 refills | Status: DC

## 2022-03-30 NOTE — Telephone Encounter (Signed)
Patient is calling back to go over results again.

## 2022-03-30 NOTE — Telephone Encounter (Signed)
Patient stated she has taking Lipitor in a while because she ran of medication. The prescription is still active gave the patient the pharmacy to request a refill. Advised patient to call the clinic when she has the medication at home, then we will schedule repeat lipid and liver 8 weeks from that date. Patient voiced understanding. Will forward to MD and nurse.

## 2022-03-30 NOTE — Addendum Note (Signed)
Addended by: Stephani Police on: 03/30/2022 10:07 AM   Modules accepted: Orders

## 2022-03-30 NOTE — Telephone Encounter (Signed)
[place] Nurse calls nurse line requesting a verbal order for a UA.   He reports he called the patient to set up home visit for tomorrow and she reported dysuria. He was unsure of any other present symptoms.  He reports with MD VO he can collect a sample at visit.   I called patient to get more symptom information, however no answer.   Will forward to PCP to advise on VO.

## 2022-03-30 NOTE — Telephone Encounter (Signed)
Error

## 2022-03-30 NOTE — Telephone Encounter (Signed)
Pt verbalized understanding of Dr Harrington Challenger' recommendations and will keep her appt 04/10/22 and will set up her lab appts at that visit.

## 2022-04-03 ENCOUNTER — Encounter: Payer: Self-pay | Admitting: Internal Medicine

## 2022-04-03 ENCOUNTER — Ambulatory Visit (INDEPENDENT_AMBULATORY_CARE_PROVIDER_SITE_OTHER): Payer: 59 | Admitting: Student

## 2022-04-03 ENCOUNTER — Encounter: Payer: Self-pay | Admitting: Student

## 2022-04-03 VITALS — BP 127/74 | HR 65 | Ht 64.0 in | Wt 195.4 lb

## 2022-04-03 DIAGNOSIS — K219 Gastro-esophageal reflux disease without esophagitis: Secondary | ICD-10-CM | POA: Diagnosis not present

## 2022-04-03 DIAGNOSIS — F259 Schizoaffective disorder, unspecified: Secondary | ICD-10-CM

## 2022-04-03 DIAGNOSIS — F4310 Post-traumatic stress disorder, unspecified: Secondary | ICD-10-CM

## 2022-04-03 DIAGNOSIS — F209 Schizophrenia, unspecified: Secondary | ICD-10-CM

## 2022-04-03 DIAGNOSIS — E611 Iron deficiency: Secondary | ICD-10-CM

## 2022-04-03 DIAGNOSIS — D649 Anemia, unspecified: Secondary | ICD-10-CM

## 2022-04-03 DIAGNOSIS — I5032 Chronic diastolic (congestive) heart failure: Secondary | ICD-10-CM

## 2022-04-03 DIAGNOSIS — G479 Sleep disorder, unspecified: Secondary | ICD-10-CM

## 2022-04-03 MED ORDER — DULOXETINE HCL 30 MG PO CPEP: 30.0000 mg | ORAL_CAPSULE | Freq: Every day | ORAL | 1 refills | Status: DC

## 2022-04-03 MED ORDER — ATORVASTATIN CALCIUM 20 MG PO TABS: 20.0000 mg | ORAL_TABLET | Freq: Every day | ORAL | 1 refills | Status: DC

## 2022-04-03 MED ORDER — OMEPRAZOLE 40 MG PO CPDR: 40.0000 mg | DELAYED_RELEASE_CAPSULE | Freq: Every morning | ORAL | 6 refills | Status: DC

## 2022-04-03 MED ORDER — TORSEMIDE 20 MG PO TABS: 20.0000 mg | ORAL_TABLET | Freq: Two times a day (BID) | ORAL | 1 refills | Status: DC

## 2022-04-03 MED ORDER — ESCITALOPRAM OXALATE 20 MG PO TABS: 20.0000 mg | ORAL_TABLET | Freq: Every day | ORAL | 1 refills | Status: DC

## 2022-04-03 MED ORDER — MELATONIN 3 MG PO TABS: 3.0000 mg | ORAL_TABLET | Freq: Every day | ORAL | 0 refills | Status: DC

## 2022-04-03 NOTE — Progress Notes (Signed)
SUBJECTIVE:   CHIEF COMPLAINT / HPI:   CHF Bilateral lower extremity edema.  Does not recall name of diuretic nor dosing.  After prompting she states she is taking torsemide 20 mg, unclear if patient is compliant.  No shortness of breath, no chest pain, no cough, no orthopnea.  Anemia Patient reports that she feels more fatigued.  No shortness of breath, no chest pain.  States " my iron is low."  On chart review, do not see iron labs within the past year.  Patient had previous GI bleed in February which required transfusion.  Also suspect anemia of chronic disease.  She would like iron studies checked.  Insomnia Difficulty falling asleep for past few weeks. Will prescribe melatonin. On prazosin for PTSD related nightmares.  Recommend follow-up to discuss further.  NeuropathyCKD Patient reports worsening leg pain.  She has leg swelling, which may contribute to leg discomfort.  [name] unhappy with her renal dosing of pregabalin, and is requesting increased dose. We discussed need for renal dosing and need for nephrology consult due to renal function.   Med refill requests -Atorvastatin -Omprezole -Torsemide -Duloxetine  -Lexapro  PERTINENT  PMH / PSH:   OBJECTIVE:   BP 127/74   Pulse 65   Ht 5\' 4"  (1.626 m)   Wt 195 lb 6.4 oz (88.6 kg)   SpO2 98%   BMI 33.54 kg/m    General: NAD, chronically ill-appearing Cardio: RRR, no MRG. Cap Refill <2s. Respiratory: CTAB, normal wob on RA GI: Abdomen is soft, not tender, not distended. BS present Extremities: +1-2 pitting edema in bilateral lower extremities Skin: Warm and dry  ASSESSMENT/PLAN:   Chronic diastolic congestive heart failure (HCC) Unclear if compliant with 20 mg torsemide (unable to tell me dosing or medication without prompting), patient still has bilateral +1-2 pitting lower extremity edema.  No orthopnea, SOB, chest pain.  Do not believe she is in heart failure exacerbation at this time.  Has follow-up with  cardiology next week. - Continue Coreg - Double torsemide dose every other day until appointment with cardiology  Anemia Patient reports feeling more fatigued.  States that her " iron is low."  Upon chart review, I do not see iron studies since 1 year ago.  Was previously hospitalized in February for GI bleed requiring transfusion.  Denies dark or bloody stools, nor bleeding anywhere else. CBC has not been checked since February.  We will obtain iron studies and CBC today. Suspect anemia of chronic disease is also contributing.  Sleep disturbance Trial 3 mg melatonin 2 hours before bedtime.  Schizoaffective disorder PTSD Patient has complex mental health disorders, previously managed by behavioral health.  We have successfully tapered off benzodiazepines.  Currently patient is taking duloxetine with Lexapro, and I question efficacy-refilled these medications as she reports compliance and do not want to send patient into withdrawal.  Also taking prazosin for PTSD and nightmares, but this is complicated by her heart failure-as cardiology prefer she is off prazosin.  We have continued prazosin at this time for symptom management.  Additionally patient takes Zyprexa 5 mg.  She reports struggling with her anxiety and depression, and would like to see psychiatry to optimize medical management.  She reports difficulty in establishing care on her own.  We have sent a referral for case management to assist with psychiatry appointment.  Follow-up recommendations Obtain lab work if patient has not seen cardiology. Follow-up with case management about psychiatry referral Follow-up on sleep disturbance  *Unfortunately  at the end of the visit when provider was to take patient to lab, patient told provider that she will no longer call nephrology nor obtain lab work today as provider will not increase her pregabalin dosing. We had thorough conversation about CKD and the need for renal dosing, to which she  responded  "my kidneys are just fine, they have always been like this."  I discussed the need for nephrology consult, however she declines. [name] still interested in psychiatry referral. I reminded patient that our practice remains available to her at all times if she changes her mind, and reassured her that we have her health and best interest at heart.*   Leslie Dales, [place]

## 2022-04-03 NOTE — Telephone Encounter (Signed)
Pt has OV on 04/10/22 will ask the pt to being all of there meds to the appt with her to follow up.

## 2022-04-03 NOTE — Assessment & Plan Note (Deleted)
Unclear if compliant with 20 mg torsemide (unable to tell me dosing or medication without prompting), patient still has bilateral +1-2 pitting lower extremity edema.  No orthopnea, SOB, chest pain.  Do not believe she is in heart failure exacerbation at this time.  Has follow-up with cardiology next week. - Continue Coreg - Double torsemide dose every other day until appointment with cardiology

## 2022-04-03 NOTE — Patient Instructions (Addendum)
It was great to see you! Thank you for allowing me to participate in your care!   I recommend that you always bring your medications to each appointment as this makes it easy to ensure we are on the correct medications and helps [name] not miss when refills are needed.  Our plans for today:  - I have refilled your medications - Please take 1 additional 20 mg torsemide every other day, until your cardiology appointment. Take 40 mg today, 20 mg 3/19, 40 mg 3/20, 20 mg 3/21 and so on. -Take 3 mg melatonin every night, 2 hours before bed -I have sent a referral for psychiatry  PLEASE CALL: [facility] Address: [address] [place], [place] [address] Phone: [phone] Fax: [phone]   We are checking some labs today, I will call you if they are abnormal will send you a MyChart message or a letter if they are normal.  If you do not hear about your labs in the next 2 weeks please let us know.  Take care and seek immediate care sooner if you develop any concerns. Please remember to show up 15 minutes before your scheduled appointment time!  Leslie Dales, DO [facility]

## 2022-04-03 NOTE — Assessment & Plan Note (Signed)
Unclear if compliant with 20 mg torsemide (unable to tell me dosing or medication without prompting), patient still has bilateral +1-2 pitting lower extremity edema.  No orthopnea, SOB, chest pain.  Do not believe she is in heart failure exacerbation at this time.  Has follow-up with cardiology next week. - Continue Coreg - Double torsemide dose every other day until appointment with cardiology

## 2022-04-05 DIAGNOSIS — Z87891 Personal history of nicotine dependence: Secondary | ICD-10-CM | POA: Diagnosis not present

## 2022-04-05 DIAGNOSIS — I2510 Atherosclerotic heart disease of native coronary artery without angina pectoris: Secondary | ICD-10-CM | POA: Diagnosis not present

## 2022-04-05 DIAGNOSIS — I5033 Acute on chronic diastolic (congestive) heart failure: Secondary | ICD-10-CM | POA: Diagnosis not present

## 2022-04-05 DIAGNOSIS — Z9181 History of falling: Secondary | ICD-10-CM | POA: Diagnosis not present

## 2022-04-05 DIAGNOSIS — M1990 Unspecified osteoarthritis, unspecified site: Secondary | ICD-10-CM | POA: Diagnosis not present

## 2022-04-05 DIAGNOSIS — I130 Hypertensive heart and chronic kidney disease with heart failure and stage 1 through stage 4 chronic kidney disease, or unspecified chronic kidney disease: Secondary | ICD-10-CM | POA: Diagnosis not present

## 2022-04-05 DIAGNOSIS — D631 Anemia in chronic kidney disease: Secondary | ICD-10-CM | POA: Diagnosis not present

## 2022-04-05 DIAGNOSIS — E1122 Type 2 diabetes mellitus with diabetic chronic kidney disease: Secondary | ICD-10-CM | POA: Diagnosis not present

## 2022-04-05 DIAGNOSIS — Z993 Dependence on wheelchair: Secondary | ICD-10-CM | POA: Diagnosis not present

## 2022-04-05 DIAGNOSIS — N184 Chronic kidney disease, stage 4 (severe): Secondary | ICD-10-CM | POA: Diagnosis not present

## 2022-04-05 DIAGNOSIS — K5730 Diverticulosis of large intestine without perforation or abscess without bleeding: Secondary | ICD-10-CM | POA: Diagnosis not present

## 2022-04-05 DIAGNOSIS — E785 Hyperlipidemia, unspecified: Secondary | ICD-10-CM | POA: Diagnosis not present

## 2022-04-05 DIAGNOSIS — J9611 Chronic respiratory failure with hypoxia: Secondary | ICD-10-CM | POA: Diagnosis not present

## 2022-04-05 DIAGNOSIS — D62 Acute posthemorrhagic anemia: Secondary | ICD-10-CM | POA: Diagnosis not present

## 2022-04-05 DIAGNOSIS — M542 Cervicalgia: Secondary | ICD-10-CM | POA: Diagnosis not present

## 2022-04-05 DIAGNOSIS — Z5982 Transportation insecurity: Secondary | ICD-10-CM | POA: Diagnosis not present

## 2022-04-05 DIAGNOSIS — F32A Depression, unspecified: Secondary | ICD-10-CM | POA: Diagnosis not present

## 2022-04-05 DIAGNOSIS — E1142 Type 2 diabetes mellitus with diabetic polyneuropathy: Secondary | ICD-10-CM | POA: Diagnosis not present

## 2022-04-05 DIAGNOSIS — J4489 Other specified chronic obstructive pulmonary disease: Secondary | ICD-10-CM | POA: Diagnosis not present

## 2022-04-05 DIAGNOSIS — Z7951 Long term (current) use of inhaled steroids: Secondary | ICD-10-CM | POA: Diagnosis not present

## 2022-04-05 DIAGNOSIS — I252 Old myocardial infarction: Secondary | ICD-10-CM | POA: Diagnosis not present

## 2022-04-05 DIAGNOSIS — J439 Emphysema, unspecified: Secondary | ICD-10-CM | POA: Diagnosis not present

## 2022-04-06 ENCOUNTER — Telehealth: Payer: Self-pay

## 2022-04-06 DIAGNOSIS — G629 Polyneuropathy, unspecified: Secondary | ICD-10-CM

## 2022-04-06 NOTE — Telephone Encounter (Signed)
Patient calls nurse line requesting something for pain.   She reports 10/10 pain in her legs. She reports they swollen and "hurt so bad." She reports she has been taking Tylenol, however reports an allergic reaction. She reports she broke out in hives.   She denies any SOB or chest pains at this time. She reports she was on pregabalin, however was told to stop this medication due to her kidney function.   [name] requesting something else for pain management.   Will forward to PCP for advisement.

## 2022-04-07 MED ORDER — CAPSAICIN 0.033 % EX CREA: TOPICAL_CREAM | CUTANEOUS | 0 refills | Status: DC

## 2022-04-07 NOTE — Telephone Encounter (Signed)
Called patient by phone, confirmed identity.  Patient reports 10/10 pain and swelling in her legs.  Patient states that she has been taking 1 pill of torsemide every other day-I then tried to clarify she was supposed to double up on torsemide every other day.  Still have concern that patient may not be taking medication appropriately.  Patient is requesting increase of pregabalin, and again discussed need for renal dosing.  I asked patient to seek medical care through ED, and she refused.  Patient expressed concern that physician is trying to make money off of hospitalizations and her pain-I reassured the patient that my interest is for her health and wellbeing.  Unfortunately, patient expressed dissatisfaction with her care. I again urged her to seek medical care and she declined.  Pain is predominantly burning, consistent with her neuropathy.  But I am concerned with her leg swelling, as it was +2 pitting edema on last visit.  She has visit with cardiology on Monday, and will hopefully make this appointment.  In the meantime I have agreed to try capsaicin cream for neuropathic pain, and sent prescription to her pharmacy.  Again I urged patient to seek medical care if she has worsening symptoms.

## 2022-04-09 NOTE — Progress Notes (Unsigned)
Cardiology Office Note   Date:  04/09/2022   ID:  Tiffany [name], DOB 30-Nov-1960, MRN [id number]  PCP:  Leslie Dales, DO  Cardiologist:   Dorris Carnes, MD   Pt presents for follow up of HFpEF      History of Present Illness: Tiffany [name] is a 62 y.o. female with a history of HFpEF, CKD, T2DM, COPD, HTN, GI bleeding schizophrenia, cocaine use     She was recently admitted to the hospital for dyspnea.  BNP 542 on admit.  Pt treated with IV lasix with significant diuresis  Hgb 7.1 on admit   Transfused    Cardiology saw the patient  Ankles flat on d/c   Since she was discharged from the hospital she has noticed swelling developing again in her legs She does sit in a wheelchair most of day  Food made by aide  who is watching salt content.  The pt denies CP  Breathing is better  She notices  a lot ot twitching   Thinks it is her potassium   Says she is taking lasix   Not on d/c meds       I saw the pt in Feb 2024         No outpatient medications have been marked as taking for the 04/10/22 encounter (Appointment) with [name], MD.     Allergies:   Aspirin, Hydrocodone, Anette Guarneri [lurasidone hcl], Magnesium-containing compounds, Prednisone, Tramadol, Codeine, Trazodone, Peanut-containing drug products, Topamax [topiramate], Sulfa antibiotics, and Tape   Past Medical History:  Diagnosis Date   Agitation 11/22/2017   Anoxic brain injury ([place]) 09/08/2016   C. Arrest due to respiratory failure and COPD exacerbation   Anxiety    Arthritis    "all over" (04/10/2016)   Asthma 10/18/2010   Binge eating disorder    Cardiac arrest ([place]) 09/08/2016   PEA   Carotid artery stenosis    1-39% bilateral by dopplers 11/2016   Chronic diastolic (congestive) heart failure (HCC)    Chronic kidney disease, stage 3 (HCC)    Chronic pain syndrome 06/18/2012   Chronic post-traumatic stress disorder (PTSD) 05/27/2018   Chronic respiratory failure with hypoxia and hypercapnia ([place])  06/22/2015   TRILOGY Vent >AVAPA-ES., Vt target 200-400, Max P 30 , PS max 20 , PS min 6-10 , E Max 6, E Min 4, Rate Auto AVAPS Rate 2 (titrate for pt comfort) , bleed O2 at 5l/m continuous flow .    CKD (chronic kidney disease) 02/03/2022   Closed displaced fracture of fifth metacarpal bone 03/21/2018   Cocaine use disorder, severe, in sustained remission ([place]) [id number]   Complication of anesthesia    decreased bp, decreased heart rate   COPD (chronic obstructive pulmonary disease) ([place]) 07/08/2014   Depression    Diabetic neuropathy ([place]) 04/24/2011   Difficulty with speech 01/24/2018   Disorder of nervous system    Drug abuse ([place]) 11/21/2017   Dyslipidemia 04/24/2011   Elevated troponin 04/28/2012   Emphysema    Encephalopathy 11/21/2017   Essential hypertension 03/22/2016   Fibula fracture 07/10/2016   Frequent falls 10/11/2017   GERD (gastroesophageal reflux disease)    Gout 04/11/2017   Heart attack ([place]) 1980s   History of blood transfusion 1994   "couldn't stop bleeding from my period"   History of drug abuse in remission ([place]) 11/28/2015   Quit in 2017   Hyperlipidemia LDL goal <70    Incontinence    Manic depression ([place])  Morbid obesity ([place]) 10/18/2010   Obstructive sleep apnea 10/18/2010   On home oxygen therapy    "6L; 24/7" (04/10/2016)   OSA on CPAP    "wear mask sometimes" (04/10/2016)   Paranoid ([place])    "sometimes; I'm on RX for it" (04/10/2016)   Prolonged Q-T interval on ECG    Rectal bleeding 12/31/2015   Schizoaffective disorder, bipolar type ([place]) 04/05/2018   Seasonal allergies    Seborrheic keratoses 12/31/2013   Seizures ([place])    "don't know what kind; last one was ~ 1 yr ago" (04/10/2016)   Sinus bradycardia    Stroke [facility]) 1980s   denies residual on 04/10/2016   Thrush 09/19/2013   Type 2 diabetes mellitus ([place]) 10/18/2010    Past Surgical History:  Procedure Laterality Date   CESAREAN SECTION  1997   COLONOSCOPY WITH PROPOFOL N/A  04/01/2021   Procedure: COLONOSCOPY WITH PROPOFOL;  Surgeon: Carol Ada, MD;  Location: [place];  Service: Gastroenterology;  Laterality: N/A;  Rectal bleeding with drop in hemoglobin to 7.2 g/dL   HERNIA REPAIR     IR CHOLANGIOGRAM EXISTING TUBE  07/20/2016   IR PERC CHOLECYSTOSTOMY  05/10/2016   IR RADIOLOGIST EVAL & MGMT  06/08/2016   IR RADIOLOGIST EVAL & MGMT  06/29/2016   IR SINUS/FIST TUBE CHK-NON GI  07/12/2016   RIGHT/LEFT HEART CATH AND CORONARY ANGIOGRAPHY N/A 06/19/2017   Procedure: RIGHT/LEFT HEART CATH AND CORONARY ANGIOGRAPHY;  Surgeon: Jolaine Artist, MD;  Location: [place] [facility];  Service: Cardiovascular;  Laterality: N/A;   TIBIA IM NAIL INSERTION Right 07/12/2016   Procedure: INTRAMEDULLARY (IM) NAIL RIGHT TIBIA;  Surgeon: Leandrew Koyanagi, MD;  Location: [place];  Service: Orthopedics;  Laterality: Right;   UMBILICAL HERNIA REPAIR  ~ 1963   "that's why I don't have a belly button"   VAGINAL HYSTERECTOMY       Social History:  The patient  reports that she quit smoking about 6 years ago. Her smoking use included cigarettes. She started smoking about 45 years ago. She has a 57.00 pack-year smoking history. She has been exposed to tobacco smoke. She has never used smokeless tobacco. She reports that she does not currently use drugs after having used the following drugs: Cocaine. She reports that she does not drink alcohol.   Family History:  The patient's family history includes Anxiety disorder in her sister; Bipolar disorder in her sister; Cancer in her father and mother; Depression in her brother, mother, sister, and sister; Heart failure in an other family member; Schizophrenia in her sister.    ROS:  Please see the history of present illness. All other systems are reviewed and  Negative to the above problem except as noted.    PHYSICAL EXAM: VS:  There were no vitals taken for this visit.  GEN: Obese 62 yo  Examined in wheelchair   HEENT: normal  Neck:  JVP  increased  No bruits   Cardiac: RRR; no murmu   1+ LE  edema  Respiratory:  clear to auscultation bilaterally, GI: soft, nontender, nondistended, + BS  No hepatomegaly  MS: no deformity Moving all extremities   Skin: warm and dry, no rash Neuro:  Strength and sensation are intact Psych: euthymic mood, full affect   EKG:  EKG is not ordered today.    ECHO:    02/04/22   1. Left ventricular ejection fraction, by estimation, is 60 to 65%. The  left ventricle has normal function. The left  ventricle has no regional  wall motion abnormalities. The left ventricular internal cavity size was  mildly dilated. There is mild left  ventricular hypertrophy. Left ventricular diastolic parameters are  consistent with Grade II diastolic dysfunction (pseudonormalization).  Elevated left atrial pressure.   2. Right ventricular systolic function is normal. The right ventricular  size is normal. There is mildly elevated pulmonary artery systolic  pressure. The estimated right ventricular systolic pressure is [id number] mmHg.   3. Left atrial size was severely dilated.   4. Right atrial size was mildly dilated.   5. The mitral valve is normal in structure. Trivial mitral valve  regurgitation. No evidence of mitral stenosis.   6. The aortic valve is tricuspid. Aortic valve regurgitation is not  visualized. Aortic valve sclerosis is present, with no evidence of aortic  valve stenosis.   7. The inferior vena cava is dilated in size with >50% respiratory  variability, suggesting right atrial pressure of 8 mmHg.    Lipid Panel    Component Value Date/Time   CHOL 127 08/05/2018 1748   TRIG 53 08/05/2018 1748   HDL 62 08/05/2018 1748   CHOLHDL 2.0 08/05/2018 1748   CHOLHDL 3.7 01/07/2017 0005   VLDL 38 01/07/2017 0005   LDLCALC 54 08/05/2018 1748   LDLDIRECT 52 04/05/2012 1210      Wt Readings from Last 3 Encounters:  04/03/22 195 lb 6.4 oz (88.6 kg)  03/07/22 206 lb (93.4 kg)  03/06/22 184 lb 4.9  oz (83.6 kg)      ASSESSMENT AND PLAN:  1  HFpEF  Pt recently admitted with acute HFpEF.  Diuresed.   Volume does appear to be up a little on exam but not bad   Lungs CTA     Will get labs today   Limit salt  2  HTN  BP is not  controlled   She does not like hydralazine as she says it gives her a HA   I would recomm increasing carvedilol to 12.5 bid    Add amlodipine 5 mg bid   She has been on it in the past    Follow up in 4 wks    3  CKD   Recent Cr 2.83  Pt needs an appt in renal clinic  Will repeat  today  4   HL  Get lipids today    5 Anemia   Recent admit CBC was 7  transfused.  The pt was seen by GI  Felt due to diverticulosis   Repeat CBC     Current medicines are reviewed at length with the patient today.  The patient does not have concerns regarding medicines.  Signed, Dorris Carnes, MD  04/09/2022 11:16 AM    [address], [place], [place]  [address] Phone: [phone]; Fax: [phone]

## 2022-04-10 ENCOUNTER — Ambulatory Visit: Payer: 59 | Attending: Internal Medicine | Admitting: Internal Medicine

## 2022-04-10 ENCOUNTER — Telehealth: Payer: Self-pay

## 2022-04-10 NOTE — Telephone Encounter (Signed)
Called and provided verbal orders for UA and culture.   Talbot Grumbling, RN

## 2022-04-10 NOTE — Telephone Encounter (Signed)
Received call from Davy Pique, [place] at [facility] regarding patient.   Patient is reporting pain with urination. She forgot to mention this at visit with Dr. Nelda Bucks on 3/18.  RN denies any other symptoms being present at this time.   They are requesting verbal orders for UA and culture.   Please advise.   Talbot Grumbling, RN

## 2022-04-14 DIAGNOSIS — I5033 Acute on chronic diastolic (congestive) heart failure: Secondary | ICD-10-CM | POA: Diagnosis not present

## 2022-04-14 DIAGNOSIS — F32A Depression, unspecified: Secondary | ICD-10-CM | POA: Diagnosis not present

## 2022-04-14 DIAGNOSIS — M542 Cervicalgia: Secondary | ICD-10-CM | POA: Diagnosis not present

## 2022-04-14 DIAGNOSIS — E1122 Type 2 diabetes mellitus with diabetic chronic kidney disease: Secondary | ICD-10-CM | POA: Diagnosis not present

## 2022-04-14 DIAGNOSIS — D631 Anemia in chronic kidney disease: Secondary | ICD-10-CM | POA: Diagnosis not present

## 2022-04-14 DIAGNOSIS — N184 Chronic kidney disease, stage 4 (severe): Secondary | ICD-10-CM | POA: Diagnosis not present

## 2022-04-14 DIAGNOSIS — E785 Hyperlipidemia, unspecified: Secondary | ICD-10-CM | POA: Diagnosis not present

## 2022-04-14 DIAGNOSIS — Z5982 Transportation insecurity: Secondary | ICD-10-CM | POA: Diagnosis not present

## 2022-04-14 DIAGNOSIS — J4489 Other specified chronic obstructive pulmonary disease: Secondary | ICD-10-CM | POA: Diagnosis not present

## 2022-04-14 DIAGNOSIS — Z9181 History of falling: Secondary | ICD-10-CM | POA: Diagnosis not present

## 2022-04-14 DIAGNOSIS — J439 Emphysema, unspecified: Secondary | ICD-10-CM | POA: Diagnosis not present

## 2022-04-14 DIAGNOSIS — J9611 Chronic respiratory failure with hypoxia: Secondary | ICD-10-CM | POA: Diagnosis not present

## 2022-04-14 DIAGNOSIS — E1142 Type 2 diabetes mellitus with diabetic polyneuropathy: Secondary | ICD-10-CM | POA: Diagnosis not present

## 2022-04-14 DIAGNOSIS — I252 Old myocardial infarction: Secondary | ICD-10-CM | POA: Diagnosis not present

## 2022-04-14 DIAGNOSIS — I2510 Atherosclerotic heart disease of native coronary artery without angina pectoris: Secondary | ICD-10-CM | POA: Diagnosis not present

## 2022-04-14 DIAGNOSIS — D62 Acute posthemorrhagic anemia: Secondary | ICD-10-CM | POA: Diagnosis not present

## 2022-04-14 DIAGNOSIS — Z87891 Personal history of nicotine dependence: Secondary | ICD-10-CM | POA: Diagnosis not present

## 2022-04-14 DIAGNOSIS — M1990 Unspecified osteoarthritis, unspecified site: Secondary | ICD-10-CM | POA: Diagnosis not present

## 2022-04-14 DIAGNOSIS — I130 Hypertensive heart and chronic kidney disease with heart failure and stage 1 through stage 4 chronic kidney disease, or unspecified chronic kidney disease: Secondary | ICD-10-CM | POA: Diagnosis not present

## 2022-04-14 DIAGNOSIS — K5730 Diverticulosis of large intestine without perforation or abscess without bleeding: Secondary | ICD-10-CM | POA: Diagnosis not present

## 2022-04-14 DIAGNOSIS — Z7951 Long term (current) use of inhaled steroids: Secondary | ICD-10-CM | POA: Diagnosis not present

## 2022-04-14 DIAGNOSIS — Z993 Dependence on wheelchair: Secondary | ICD-10-CM | POA: Diagnosis not present

## 2022-04-17 DIAGNOSIS — M170 Bilateral primary osteoarthritis of knee: Secondary | ICD-10-CM | POA: Diagnosis not present

## 2022-04-17 DIAGNOSIS — Z9181 History of falling: Secondary | ICD-10-CM | POA: Diagnosis not present

## 2022-04-17 NOTE — Progress Notes (Deleted)
    SUBJECTIVE:   CHIEF COMPLAINT / HPI: Chronic bilateral leg pain  Neuropathic pain as well as bilateral lower extremity edema from CHF. Diuretics***. On renally dosed pregabalin.   PERTINENT  PMH / PSH: ***  OBJECTIVE:   There were no vitals taken for this visit.  ***  ASSESSMENT/PLAN:   No problem-specific Assessment & Plan notes found for this encounter.     Gerrit Heck, MD [place]

## 2022-04-18 ENCOUNTER — Ambulatory Visit: Payer: 59 | Admitting: Student

## 2022-04-18 ENCOUNTER — Telehealth: Payer: Self-pay

## 2022-04-18 DIAGNOSIS — Z9181 History of falling: Secondary | ICD-10-CM | POA: Diagnosis not present

## 2022-04-18 DIAGNOSIS — M542 Cervicalgia: Secondary | ICD-10-CM | POA: Diagnosis not present

## 2022-04-18 DIAGNOSIS — E785 Hyperlipidemia, unspecified: Secondary | ICD-10-CM | POA: Diagnosis not present

## 2022-04-18 DIAGNOSIS — Z7951 Long term (current) use of inhaled steroids: Secondary | ICD-10-CM | POA: Diagnosis not present

## 2022-04-18 DIAGNOSIS — J9611 Chronic respiratory failure with hypoxia: Secondary | ICD-10-CM | POA: Diagnosis not present

## 2022-04-18 DIAGNOSIS — Z5982 Transportation insecurity: Secondary | ICD-10-CM | POA: Diagnosis not present

## 2022-04-18 DIAGNOSIS — D631 Anemia in chronic kidney disease: Secondary | ICD-10-CM | POA: Diagnosis not present

## 2022-04-18 DIAGNOSIS — J4489 Other specified chronic obstructive pulmonary disease: Secondary | ICD-10-CM | POA: Diagnosis not present

## 2022-04-18 DIAGNOSIS — N184 Chronic kidney disease, stage 4 (severe): Secondary | ICD-10-CM | POA: Diagnosis not present

## 2022-04-18 DIAGNOSIS — I5033 Acute on chronic diastolic (congestive) heart failure: Secondary | ICD-10-CM | POA: Diagnosis not present

## 2022-04-18 DIAGNOSIS — Z993 Dependence on wheelchair: Secondary | ICD-10-CM | POA: Diagnosis not present

## 2022-04-18 DIAGNOSIS — Z87891 Personal history of nicotine dependence: Secondary | ICD-10-CM | POA: Diagnosis not present

## 2022-04-18 DIAGNOSIS — I252 Old myocardial infarction: Secondary | ICD-10-CM | POA: Diagnosis not present

## 2022-04-18 DIAGNOSIS — E1122 Type 2 diabetes mellitus with diabetic chronic kidney disease: Secondary | ICD-10-CM | POA: Diagnosis not present

## 2022-04-18 DIAGNOSIS — F32A Depression, unspecified: Secondary | ICD-10-CM | POA: Diagnosis not present

## 2022-04-18 DIAGNOSIS — I130 Hypertensive heart and chronic kidney disease with heart failure and stage 1 through stage 4 chronic kidney disease, or unspecified chronic kidney disease: Secondary | ICD-10-CM | POA: Diagnosis not present

## 2022-04-18 DIAGNOSIS — K5730 Diverticulosis of large intestine without perforation or abscess without bleeding: Secondary | ICD-10-CM | POA: Diagnosis not present

## 2022-04-18 DIAGNOSIS — J439 Emphysema, unspecified: Secondary | ICD-10-CM | POA: Diagnosis not present

## 2022-04-18 DIAGNOSIS — E1142 Type 2 diabetes mellitus with diabetic polyneuropathy: Secondary | ICD-10-CM | POA: Diagnosis not present

## 2022-04-18 DIAGNOSIS — M1990 Unspecified osteoarthritis, unspecified site: Secondary | ICD-10-CM | POA: Diagnosis not present

## 2022-04-18 DIAGNOSIS — D62 Acute posthemorrhagic anemia: Secondary | ICD-10-CM | POA: Diagnosis not present

## 2022-04-18 DIAGNOSIS — I2510 Atherosclerotic heart disease of native coronary artery without angina pectoris: Secondary | ICD-10-CM | POA: Diagnosis not present

## 2022-04-18 NOTE — Telephone Encounter (Signed)
Called patient and rescheduled appointment for Friday 4/5.  Advised that provider would not be able to send in alternative pain medications until she is re evaluated.   She verbalizes understanding.   Talbot Grumbling, RN

## 2022-04-18 NOTE — Telephone Encounter (Signed)
Patient calls nurse line regarding continued pain in her legs. She states that tylenol and "pain cream" are not working. [name] requesting alternatives for patient management.   She has an appointment with daughter Jinny Sanders this morning.   Talbot Grumbling, RN

## 2022-04-19 ENCOUNTER — Telehealth: Payer: Self-pay

## 2022-04-19 NOTE — Telephone Encounter (Signed)
Received call from Tiffany [name] regarding patient. Tiffany [name] received referral from Tiffany [name] regarding palliative program.   They are requesting verbal orders for home based Palliative program.   Please advise if this is appropriate. If so, I can call Tiffany [name] and provide with these orders.   Thanks.   Talbot Grumbling, RN

## 2022-04-20 NOTE — Telephone Encounter (Signed)
Returned call to Manus Gunning and provided with verbal orders for Palliative program.   Talbot Grumbling, RN

## 2022-04-21 ENCOUNTER — Ambulatory Visit: Payer: 59 | Admitting: Family Medicine

## 2022-04-21 DIAGNOSIS — J439 Emphysema, unspecified: Secondary | ICD-10-CM | POA: Diagnosis not present

## 2022-04-21 DIAGNOSIS — J9611 Chronic respiratory failure with hypoxia: Secondary | ICD-10-CM | POA: Diagnosis not present

## 2022-04-21 DIAGNOSIS — I2510 Atherosclerotic heart disease of native coronary artery without angina pectoris: Secondary | ICD-10-CM | POA: Diagnosis not present

## 2022-04-21 DIAGNOSIS — D631 Anemia in chronic kidney disease: Secondary | ICD-10-CM | POA: Diagnosis not present

## 2022-04-21 DIAGNOSIS — I5033 Acute on chronic diastolic (congestive) heart failure: Secondary | ICD-10-CM | POA: Diagnosis not present

## 2022-04-21 DIAGNOSIS — I252 Old myocardial infarction: Secondary | ICD-10-CM | POA: Diagnosis not present

## 2022-04-21 DIAGNOSIS — E1142 Type 2 diabetes mellitus with diabetic polyneuropathy: Secondary | ICD-10-CM | POA: Diagnosis not present

## 2022-04-21 DIAGNOSIS — Z993 Dependence on wheelchair: Secondary | ICD-10-CM | POA: Diagnosis not present

## 2022-04-21 DIAGNOSIS — D62 Acute posthemorrhagic anemia: Secondary | ICD-10-CM | POA: Diagnosis not present

## 2022-04-21 DIAGNOSIS — Z5982 Transportation insecurity: Secondary | ICD-10-CM | POA: Diagnosis not present

## 2022-04-21 DIAGNOSIS — Z87891 Personal history of nicotine dependence: Secondary | ICD-10-CM | POA: Diagnosis not present

## 2022-04-21 DIAGNOSIS — Z7951 Long term (current) use of inhaled steroids: Secondary | ICD-10-CM | POA: Diagnosis not present

## 2022-04-21 DIAGNOSIS — J4489 Other specified chronic obstructive pulmonary disease: Secondary | ICD-10-CM | POA: Diagnosis not present

## 2022-04-21 DIAGNOSIS — E1122 Type 2 diabetes mellitus with diabetic chronic kidney disease: Secondary | ICD-10-CM | POA: Diagnosis not present

## 2022-04-21 DIAGNOSIS — N184 Chronic kidney disease, stage 4 (severe): Secondary | ICD-10-CM | POA: Diagnosis not present

## 2022-04-21 DIAGNOSIS — I130 Hypertensive heart and chronic kidney disease with heart failure and stage 1 through stage 4 chronic kidney disease, or unspecified chronic kidney disease: Secondary | ICD-10-CM | POA: Diagnosis not present

## 2022-04-21 DIAGNOSIS — K5730 Diverticulosis of large intestine without perforation or abscess without bleeding: Secondary | ICD-10-CM | POA: Diagnosis not present

## 2022-04-21 DIAGNOSIS — F32A Depression, unspecified: Secondary | ICD-10-CM | POA: Diagnosis not present

## 2022-04-21 DIAGNOSIS — E785 Hyperlipidemia, unspecified: Secondary | ICD-10-CM | POA: Diagnosis not present

## 2022-04-21 DIAGNOSIS — Z9181 History of falling: Secondary | ICD-10-CM | POA: Diagnosis not present

## 2022-04-21 DIAGNOSIS — M1990 Unspecified osteoarthritis, unspecified site: Secondary | ICD-10-CM | POA: Diagnosis not present

## 2022-04-21 DIAGNOSIS — M542 Cervicalgia: Secondary | ICD-10-CM | POA: Diagnosis not present

## 2022-04-24 ENCOUNTER — Telehealth: Payer: Self-pay

## 2022-04-24 ENCOUNTER — Other Ambulatory Visit: Payer: Self-pay

## 2022-04-24 MED ORDER — PREGABALIN 50 MG PO CAPS: 50.0000 mg | ORAL_CAPSULE | Freq: Three times a day (TID) | ORAL | 0 refills | Status: DC

## 2022-04-25 ENCOUNTER — Other Ambulatory Visit: Payer: Self-pay | Admitting: Student

## 2022-04-25 DIAGNOSIS — N189 Chronic kidney disease, unspecified: Secondary | ICD-10-CM | POA: Diagnosis not present

## 2022-04-25 DIAGNOSIS — I509 Heart failure, unspecified: Secondary | ICD-10-CM | POA: Diagnosis not present

## 2022-04-25 DIAGNOSIS — R062 Wheezing: Secondary | ICD-10-CM | POA: Diagnosis not present

## 2022-04-25 DIAGNOSIS — G4733 Obstructive sleep apnea (adult) (pediatric): Secondary | ICD-10-CM | POA: Diagnosis not present

## 2022-04-25 DIAGNOSIS — I10 Essential (primary) hypertension: Secondary | ICD-10-CM | POA: Diagnosis not present

## 2022-04-25 DIAGNOSIS — E119 Type 2 diabetes mellitus without complications: Secondary | ICD-10-CM | POA: Diagnosis not present

## 2022-04-25 DIAGNOSIS — R609 Edema, unspecified: Secondary | ICD-10-CM | POA: Diagnosis not present

## 2022-04-25 DIAGNOSIS — E782 Mixed hyperlipidemia: Secondary | ICD-10-CM | POA: Diagnosis not present

## 2022-04-25 DIAGNOSIS — J45909 Unspecified asthma, uncomplicated: Secondary | ICD-10-CM | POA: Diagnosis not present

## 2022-04-25 MED ORDER — LOPERAMIDE HCL 2 MG PO TABS: 2.0000 mg | ORAL_TABLET | Freq: Three times a day (TID) | ORAL | 1 refills | Status: DC | PRN

## 2022-04-25 NOTE — Progress Notes (Signed)
Refilled loperamide, which was previous medication for patient.

## 2022-04-26 ENCOUNTER — Ambulatory Visit: Payer: 59 | Admitting: Family Medicine

## 2022-05-02 DIAGNOSIS — E1141 Type 2 diabetes mellitus with diabetic mononeuropathy: Secondary | ICD-10-CM | POA: Diagnosis not present

## 2022-05-02 DIAGNOSIS — I130 Hypertensive heart and chronic kidney disease with heart failure and stage 1 through stage 4 chronic kidney disease, or unspecified chronic kidney disease: Secondary | ICD-10-CM | POA: Diagnosis not present

## 2022-05-02 DIAGNOSIS — N1832 Chronic kidney disease, stage 3b: Secondary | ICD-10-CM | POA: Diagnosis not present

## 2022-05-02 DIAGNOSIS — I5032 Chronic diastolic (congestive) heart failure: Secondary | ICD-10-CM | POA: Diagnosis not present

## 2022-05-02 DIAGNOSIS — E1122 Type 2 diabetes mellitus with diabetic chronic kidney disease: Secondary | ICD-10-CM | POA: Diagnosis not present

## 2022-05-03 ENCOUNTER — Telehealth: Payer: Self-pay | Admitting: Cardiology

## 2022-05-03 ENCOUNTER — Other Ambulatory Visit: Payer: Self-pay | Admitting: Family Medicine

## 2022-05-03 DIAGNOSIS — F259 Schizoaffective disorder, unspecified: Secondary | ICD-10-CM

## 2022-05-03 NOTE — Telephone Encounter (Signed)
Will her new case manger reached out to let us know the pt is having pitting lower extremity edema and her kung sounds are diminished in both of her lung bases... he does not know her baseline since he just met her today.   He says she is not feeling bad and no other concerning symptoms for pneumonia.[name] no cough, fever, no increased SOB.   [name] taking her Torsemide 20 mg BID daily.   Will forward to Dr Tenny Craw for review and recommendations.   I also asked him about having her seen more urgently if he could arrange a ride for her to go to the Urgent Care for possible CXR and he says he will see if possible.[name]

## 2022-05-03 NOTE — Telephone Encounter (Signed)
Will, who is a Financial risk analyst, called and said that patient is experiencing pitting edema down both legs. Also has diminished lung sounds coming from both lower lobes on both sides. Wonder if she should changer torsemide to get fluid off

## 2022-05-03 NOTE — Telephone Encounter (Signed)
Patient's last labs were 1 month ago   Cr was 2.73    WOuld need repeat BMET and BNP Also, she was seen in Feb    Should be put in for follow up

## 2022-05-04 ENCOUNTER — Telehealth: Payer: Self-pay | Admitting: *Deleted

## 2022-05-04 DIAGNOSIS — E1142 Type 2 diabetes mellitus with diabetic polyneuropathy: Secondary | ICD-10-CM | POA: Diagnosis not present

## 2022-05-04 DIAGNOSIS — J439 Emphysema, unspecified: Secondary | ICD-10-CM | POA: Diagnosis not present

## 2022-05-04 DIAGNOSIS — Z87891 Personal history of nicotine dependence: Secondary | ICD-10-CM | POA: Diagnosis not present

## 2022-05-04 DIAGNOSIS — K5730 Diverticulosis of large intestine without perforation or abscess without bleeding: Secondary | ICD-10-CM | POA: Diagnosis not present

## 2022-05-04 DIAGNOSIS — I5033 Acute on chronic diastolic (congestive) heart failure: Secondary | ICD-10-CM | POA: Diagnosis not present

## 2022-05-04 DIAGNOSIS — I2510 Atherosclerotic heart disease of native coronary artery without angina pectoris: Secondary | ICD-10-CM | POA: Diagnosis not present

## 2022-05-04 DIAGNOSIS — F32A Depression, unspecified: Secondary | ICD-10-CM | POA: Diagnosis not present

## 2022-05-04 DIAGNOSIS — I252 Old myocardial infarction: Secondary | ICD-10-CM | POA: Diagnosis not present

## 2022-05-04 DIAGNOSIS — Z993 Dependence on wheelchair: Secondary | ICD-10-CM | POA: Diagnosis not present

## 2022-05-04 DIAGNOSIS — E785 Hyperlipidemia, unspecified: Secondary | ICD-10-CM | POA: Diagnosis not present

## 2022-05-04 DIAGNOSIS — Z9181 History of falling: Secondary | ICD-10-CM | POA: Diagnosis not present

## 2022-05-04 DIAGNOSIS — D631 Anemia in chronic kidney disease: Secondary | ICD-10-CM | POA: Diagnosis not present

## 2022-05-04 DIAGNOSIS — M1990 Unspecified osteoarthritis, unspecified site: Secondary | ICD-10-CM | POA: Diagnosis not present

## 2022-05-04 DIAGNOSIS — D62 Acute posthemorrhagic anemia: Secondary | ICD-10-CM | POA: Diagnosis not present

## 2022-05-04 DIAGNOSIS — E1122 Type 2 diabetes mellitus with diabetic chronic kidney disease: Secondary | ICD-10-CM | POA: Diagnosis not present

## 2022-05-04 DIAGNOSIS — J4489 Other specified chronic obstructive pulmonary disease: Secondary | ICD-10-CM | POA: Diagnosis not present

## 2022-05-04 DIAGNOSIS — N184 Chronic kidney disease, stage 4 (severe): Secondary | ICD-10-CM | POA: Diagnosis not present

## 2022-05-04 DIAGNOSIS — M542 Cervicalgia: Secondary | ICD-10-CM | POA: Diagnosis not present

## 2022-05-04 DIAGNOSIS — Z7951 Long term (current) use of inhaled steroids: Secondary | ICD-10-CM | POA: Diagnosis not present

## 2022-05-04 DIAGNOSIS — I130 Hypertensive heart and chronic kidney disease with heart failure and stage 1 through stage 4 chronic kidney disease, or unspecified chronic kidney disease: Secondary | ICD-10-CM | POA: Diagnosis not present

## 2022-05-04 DIAGNOSIS — J9611 Chronic respiratory failure with hypoxia: Secondary | ICD-10-CM | POA: Diagnosis not present

## 2022-05-04 DIAGNOSIS — Z5982 Transportation insecurity: Secondary | ICD-10-CM | POA: Diagnosis not present

## 2022-05-04 NOTE — Progress Notes (Signed)
  Care Coordination  Outreach Note  05/04/2022 Name: LAKHIA GENGLER MRN: [id number] DOB: 12-27-1960   Care Coordination Outreach Attempts: An unsuccessful telephone outreach was attempted today to offer the patient information about available care coordination services as a benefit of their health plan.   Follow Up Plan:  Additional outreach attempts will be made to offer the patient care coordination information and services.   Encounter Outcome:  No Answer  Christie Nottingham  Care Coordination Care Guide  Direct Dial: [phone]

## 2022-05-04 NOTE — Telephone Encounter (Signed)
I spoke with Tiffany [name] her HH case manger and he does not do bloodwork in the home... I am working on finding an appt time to bring her back to the office.

## 2022-05-05 ENCOUNTER — Ambulatory Visit: Payer: 59 | Attending: Internal Medicine | Admitting: Internal Medicine

## 2022-05-05 ENCOUNTER — Encounter: Payer: Self-pay | Admitting: Internal Medicine

## 2022-05-05 VITALS — BP 120/64 | HR 67

## 2022-05-05 DIAGNOSIS — N189 Chronic kidney disease, unspecified: Secondary | ICD-10-CM

## 2022-05-05 DIAGNOSIS — Z79899 Other long term (current) drug therapy: Secondary | ICD-10-CM

## 2022-05-05 DIAGNOSIS — I2510 Atherosclerotic heart disease of native coronary artery without angina pectoris: Secondary | ICD-10-CM | POA: Diagnosis not present

## 2022-05-05 DIAGNOSIS — I6529 Occlusion and stenosis of unspecified carotid artery: Secondary | ICD-10-CM | POA: Diagnosis not present

## 2022-05-05 DIAGNOSIS — N179 Acute kidney failure, unspecified: Secondary | ICD-10-CM | POA: Diagnosis not present

## 2022-05-05 DIAGNOSIS — I5033 Acute on chronic diastolic (congestive) heart failure: Secondary | ICD-10-CM | POA: Diagnosis not present

## 2022-05-05 DIAGNOSIS — I5032 Chronic diastolic (congestive) heart failure: Secondary | ICD-10-CM | POA: Diagnosis not present

## 2022-05-05 DIAGNOSIS — R7989 Other specified abnormal findings of blood chemistry: Secondary | ICD-10-CM | POA: Diagnosis not present

## 2022-05-05 NOTE — Telephone Encounter (Signed)
Pt is able to come in today to be seen by Dr Tenny Craw.

## 2022-05-05 NOTE — Progress Notes (Signed)
Cardiology Office Note   Date:  05/05/2022   ID:  Tiffany [name], DOB [birth year], MRN [id number]  PCP:  Tiffany Kocher, DO  Cardiologist:   Dietrich Pates, MD   Pt presents for follow up of HFpEF      History of Present Illness: Tiffany [name] is a 62 y.o. female with a history of HFpEF, CKD, T2DM, COPD, HTN, GI bleeding schizophrenia, cocaine use     I saw the pt in Feb 2024  She had mild LE edema at the time     The pt called in complaining of worsening LE edema for the past month   Comes in today for evaluation. She says her diet is unchanged    She drinks about 1.5 L fluid per day  Stable She had chest pressure yesterday that lasted about 1/2 day  Wnet to bed and pain gone when woke up     She denies dizziness, no palpitations        Current Meds  Medication Sig   albuterol (VENTOLIN HFA) 108 (90 Base) MCG/ACT inhaler Inhale 2 puffs into the lungs every 6 (six) hours as needed for wheezing or shortness of breath.   amLODipine (NORVASC) 5 MG tablet Take 1 tablet (5 mg total) by mouth 2 (two) times daily.   atorvastatin (LIPITOR) 20 MG tablet Take 1 tablet (20 mg total) by mouth daily.   Capsaicin 0.033 % CREA Apply a thin layer of cream twice daily to most painful areas of feet and legs.   carvedilol (COREG) 12.5 MG tablet Take 1 tablet (12.5 mg total) by mouth 2 (two) times daily.   dicyclomine (BENTYL) 10 MG capsule Take 10 mg by mouth 3 (three) times daily.   DULoxetine (CYMBALTA) 30 MG capsule Take 1 capsule (30 mg total) by mouth daily.   escitalopram (LEXAPRO) 20 MG tablet Take 1 tablet (20 mg total) by mouth daily.   Fluticasone-Umeclidin-Vilant (TRELEGY ELLIPTA) 100-62.5-25 MCG/ACT AEPB INHALE 1 PUFF INTO THE LUNGS DAILY   lamoTRIgine (LAMICTAL) 25 MG tablet Take by mouth.   loperamide (IMODIUM A-D) 2 MG tablet Take 1 tablet (2 mg total) by mouth 3 (three) times daily as needed for diarrhea or loose stools.   melatonin 3 MG TABS tablet Take 1 tablet (3 mg total) by  mouth at bedtime.   OLANZapine (ZYPREXA) 5 MG tablet Take 1 tablet (5 mg total) by mouth daily.   omeprazole (PRILOSEC) 40 MG capsule Take 1 capsule (40 mg total) by mouth every morning.   Potassium Chloride ER 20 MEQ TBCR Take 1 tablet by mouth daily.   prazosin (MINIPRESS) 2 MG capsule TAKE 1 CAPSULE(2 MG) BY MOUTH AT BEDTIME   pregabalin (LYRICA) 50 MG capsule Take 1 capsule (50 mg total) by mouth 3 (three) times daily.   SAPHRIS 5 MG SUBL 24 hr tablet Place 5 mg under the tongue 2 (two) times daily.   torsemide (DEMADEX) 20 MG tablet Take 1 tablet (20 mg total) by mouth 2 (two) times daily.   [DISCONTINUED] ezetimibe (ZETIA) 10 MG tablet Take 1 tablet (10 mg total) by mouth daily.     Allergies:   Aspirin, Hydrocodone, Latuda [lurasidone hcl], Magnesium-containing compounds, Prednisone, Tramadol, Codeine, Trazodone, Peanut-containing drug products, Topamax [topiramate], Sulfa antibiotics, Tape, and Zetia [ezetimibe]   Past Medical History:  Diagnosis Date   Agitation 11/22/2017   Anoxic brain injury 09/08/2016   C. Arrest due to respiratory failure and COPD exacerbation   Anxiety    Arthritis    "  all over" (04/10/2016)   Asthma 10/18/2010   Binge eating disorder    Cardiac arrest 09/08/2016   PEA   Carotid artery stenosis    1-39% bilateral by dopplers 11/2016   Chronic diastolic (congestive) heart failure    Chronic kidney disease, stage 3    Chronic pain syndrome 06/18/2012   Chronic post-traumatic stress disorder (PTSD) 05/27/2018   Chronic respiratory failure with hypoxia and hypercapnia 06/22/2015   TRILOGY Vent >AVAPA-ES., Vt target 200-400, Max P 30 , PS max 20 , PS min 6-10 , E Max 6, E Min 4, Rate Auto AVAPS Rate 2 (titrate for pt comfort) , bleed O2 at 5l/m continuous flow .    CKD (chronic kidney disease) 02/03/2022   Closed displaced fracture of fifth metacarpal bone 03/21/2018   Cocaine use disorder, severe, in sustained remission 12/17/2015   Complication of  anesthesia    decreased bp, decreased heart rate   COPD (chronic obstructive pulmonary disease) 07/08/2014   Depression    Diabetic neuropathy 04/24/2011   Difficulty with speech 01/24/2018   Disorder of nervous system    Drug abuse 11/21/2017   Dyslipidemia 04/24/2011   Elevated troponin 04/28/2012   Emphysema    Encephalopathy 11/21/2017   Essential hypertension 03/22/2016   Fibula fracture 07/10/2016   Frequent falls 10/11/2017   GERD (gastroesophageal reflux disease)    Gout 04/11/2017   Heart attack 1980s   History of blood transfusion 1994   "couldn't stop bleeding from my period"   History of drug abuse in remission 11/28/2015   Quit in 2017   Hyperlipidemia LDL goal <70    Incontinence    Manic depression    Morbid obesity 10/18/2010   Obstructive sleep apnea 10/18/2010   On home oxygen therapy    "6L; 24/7" (04/10/2016)   OSA on CPAP    "wear mask sometimes" (04/10/2016)   Paranoid    "sometimes; I'm on RX for it" (04/10/2016)   Prolonged Q-T interval on ECG    Rectal bleeding 12/31/2015   Schizoaffective disorder, bipolar type 04/05/2018   Seasonal allergies    Seborrheic keratoses 12/31/2013   Seizures    "don't know what kind; last one was ~ 1 yr ago" (04/10/2016)   Sinus bradycardia    Stroke 1980s   denies residual on 04/10/2016   Thrush 09/19/2013   Type 2 diabetes mellitus 10/18/2010    Past Surgical History:  Procedure Laterality Date   CESAREAN SECTION  1997   COLONOSCOPY WITH PROPOFOL N/A 04/01/2021   Procedure: COLONOSCOPY WITH PROPOFOL;  Surgeon: Jeani Hawking, MD;  Location: [facility] ENDOSCOPY;  Service: Gastroenterology;  Laterality: N/A;  Rectal bleeding with drop in hemoglobin to 7.2 g/dL   HERNIA REPAIR     IR CHOLANGIOGRAM EXISTING TUBE  07/20/2016   IR PERC CHOLECYSTOSTOMY  05/10/2016   IR RADIOLOGIST EVAL & MGMT  06/08/2016   IR RADIOLOGIST EVAL & MGMT  06/29/2016   IR SINUS/FIST TUBE CHK-NON GI  07/12/2016   RIGHT/LEFT HEART CATH AND CORONARY  ANGIOGRAPHY N/A 06/19/2017   Procedure: RIGHT/LEFT HEART CATH AND CORONARY ANGIOGRAPHY;  Surgeon: Dolores Patty, MD;  Location: [facility] IN[facility]ASIVE [facility];  Service: Cardiovascular;  Laterality: N/A;   TIBIA IM NAIL INSERTION Right 07/12/2016   Procedure: INTRAMEDULLARY (IM) NAIL RIGHT TIBIA;  Surgeon: Tarry Kos, MD;  Location: [facility] OR;  Service: Orthopedics;  Laterality: Right;   UMBILICAL HERNIA REPAIR  ~ 1963   "that's why I don't have a belly button"  VAGINAL HYSTERECTOMY       Social History:  The patient  reports that she quit smoking about 6 years ago. Her smoking use included cigarettes. She started smoking about 45 years ago. She has a 57.00 pack-year smoking history. She has been exposed to tobacco smoke. She has never used smokeless tobacco. She reports that she does not currently use drugs after having used the following drugs: Cocaine. She reports that she does not drink alcohol.   Family History:  The patient's family history includes Anxiety disorder in her sister; Bipolar disorder in her sister; Cancer in her father and mother; Depression in her brother, mother, sister, and sister; Heart failure in an other family member; Schizophrenia in her sister.    ROS:  Please see the history of present illness. All other systems are reviewed and  Negative to the above problem except as noted.    PHYSICAL EXAM: VS:  BP 120/64   Pulse 67   SpO2 94%   GEN: Obese 62 yo  Examined in wheelchair   HEENT: normal  Neck:  JVP increased   Cardiac: RRR; no murmu   2+ LE  edema above knee  Respiratory: Sl rales  Moving air   GI: soft, nontender, obese     EKG:  EKG is not ordered today.    ECHO:    02/04/22   1. Left ventricular ejection fraction, by estimation, is 60 to 65%. The  left ventricle has normal function. The left ventricle has no regional  wall motion abnormalities. The left ventricular internal cavity size was  mildly dilated. There is mild left  ventricular hypertrophy.  Left ventricular diastolic parameters are  consistent with Grade II diastolic dysfunction (pseudonormalization).  Elevated left atrial pressure.   2. Right ventricular systolic function is normal. The right ventricular  size is normal. There is mildly elevated pulmonary artery systolic  pressure. The estimated right ventricular systolic pressure is 39.1 mmHg.   3. Left atrial size was severely dilated.   4. Right atrial size was mildly dilated.   5. The mitral valve is normal in structure. Trivial mitral valve  regurgitation. No evidence of mitral stenosis.   6. The aortic valve is tricuspid. Aortic valve regurgitation is not  visualized. Aortic valve sclerosis is present, with no evidence of aortic  valve stenosis.   7. The inferior vena cava is dilated in size with >50% respiratory  variability, suggesting right atrial pressure of 8 mmHg.    Lipid Panel    Component Value Date/Time   CHOL 127 08/05/2018 1748   TRIG 53 08/05/2018 1748   HDL 62 08/05/2018 1748   CHOLHDL 2.0 08/05/2018 1748   CHOLHDL 3.7 01/07/2017 0005   VLDL 38 01/07/2017 0005   LDLCALC 54 08/05/2018 1748   LDLDIRECT 52 04/05/2012 1210      Wt Readings from Last 3 Encounters:  04/03/22 195 lb 6.4 oz (88.6 kg)  03/07/22 206 lb (93.4 kg)  03/06/22 184 lb 4.9 oz (83.6 kg)      ASSESSMENT AND PLAN:  1  HFpEF  Pt with marked volume increase on exam   Will get labs today before making any further recommendations      Watch/limit salt     2  HTN  BP is controlled  Follow     3  CKD  Repeat BMET today     4  Hx  Anemia   Repeat CBC   Current medicines are reviewed at length with the patient  today.  The patient does not have concerns regarding medicines.  Signed, Dietrich Pates, MD  05/05/2022 9:45 PM    [facility] [address] [place], [place], [place]  [address] Phone: [phone]; Fax: [phone]

## 2022-05-05 NOTE — Patient Instructions (Signed)
Medication Instructions:   *If you need a refill on your cardiac medications before your next appointment, please call your pharmacy*   Lab Work: CMET, CBC, PRO BNP  If you have labs (blood work) drawn today and your tests are completely normal, you will receive your results only by: MyChart Message (if you have MyChart) OR A paper copy in the mail If you have any lab test that is abnormal or we need to change your treatment, we will call you to review the results.   Testing/Procedures:    Follow-Up: At [facility], you and your health needs are our priority.  As part of our continuing mission to provide you with exceptional heart care, we have created designated Provider Care Teams.  These Care Teams include your primary Cardiologist (physician) and Advanced Practice Providers (APPs -  Physician Assistants and Nurse Practitioners) who all work together to provide you with the care you need, when you need it.  We recommend signing up for the patient portal called "MyChart".  Sign up information is provided on this After Visit Summary.  MyChart is used to connect with patients for Virtual Visits (Telemedicine).  Patients are able to view lab/test results, encounter notes, upcoming appointments, etc.  Non-urgent messages can be sent to your provider as well.   To learn more about what you can do with MyChart, go to [email or web address].    

## 2022-05-05 NOTE — Telephone Encounter (Signed)
After a few calls to the pt and her voicemail being full, I spoke with her and she will see of her mother can bring her in to see Dr Tenny Craw this afternoon.   Pt reports that Will the Case manager is no longer coming to the house but she has a new lady and not sure of her name from [facility] coming to see her. She will try to get her name and number to bring to her next appt.   Her "friend" Caprice Kluver is also no longer coming to see her so I have deleted her from her Demographics.

## 2022-05-06 LAB — COMPREHENSIVE METABOLIC PANEL
ALT: 7 IU/L (ref 0–32)
AST: 16 IU/L (ref 0–40)
Albumin/Globulin Ratio: 1.5 (ref 1.2–2.2)
Albumin: 4 g/dL (ref 3.9–4.9)
Alkaline Phosphatase: 183 IU/L — ABNORMAL HIGH (ref 44–121)
BUN/Creatinine Ratio: 14 (ref 12–28)
BUN: 50 mg/dL — ABNORMAL HIGH (ref 8–27)
Bilirubin Total: <0.2 mg/dL (ref 0.0–1.2)
CO2: 20 mmol/L (ref 20–29)
Calcium: 9.1 mg/dL (ref 8.7–10.3)
Chloride: 100 mmol/L (ref 96–106)
Creatinine, Ser: 3.65 mg/dL — ABNORMAL HIGH (ref 0.57–1.00)
Globulin, Total: 2.7 g/dL (ref 1.5–4.5)
Glucose: 80 mg/dL (ref 70–99)
Potassium: 3.8 mmol/L (ref 3.5–5.2)
Sodium: 141 mmol/L (ref 134–144)
Total Protein: 6.7 g/dL (ref 6.0–8.5)
eGFR: 13 mL/min/{1.73_m2} — ABNORMAL LOW (ref >59)

## 2022-05-06 LAB — CBC
Hematocrit: 28.3 % — ABNORMAL LOW (ref 34.0–46.6)
Hemoglobin: 8.9 g/dL — ABNORMAL LOW (ref 11.1–15.9)
MCH: 27.9 pg (ref 26.6–33.0)
MCHC: 31.4 g/dL — ABNORMAL LOW (ref 31.5–35.7)
MCV: 89 fL (ref 79–97)
Platelets: 248 10*3/uL (ref 150–450)
RBC: 3.19 x10E6/uL — ABNORMAL LOW (ref 3.77–5.28)
RDW: 15.2 % (ref 11.7–15.4)
WBC: 6.2 10*3/uL (ref 3.4–10.8)

## 2022-05-06 LAB — PRO B NATRIURETIC PEPTIDE: NT-Pro BNP: 1007 pg/mL — ABNORMAL HIGH (ref 0–287)

## 2022-05-08 ENCOUNTER — Telehealth: Payer: Self-pay

## 2022-05-08 DIAGNOSIS — I5033 Acute on chronic diastolic (congestive) heart failure: Secondary | ICD-10-CM

## 2022-05-08 DIAGNOSIS — N179 Acute kidney failure, unspecified: Secondary | ICD-10-CM

## 2022-05-08 DIAGNOSIS — I5032 Chronic diastolic (congestive) heart failure: Secondary | ICD-10-CM

## 2022-05-08 DIAGNOSIS — I2510 Atherosclerotic heart disease of native coronary artery without angina pectoris: Secondary | ICD-10-CM

## 2022-05-08 DIAGNOSIS — R7989 Other specified abnormal findings of blood chemistry: Secondary | ICD-10-CM

## 2022-05-08 DIAGNOSIS — Z79899 Other long term (current) drug therapy: Secondary | ICD-10-CM

## 2022-05-08 NOTE — Telephone Encounter (Signed)
-----   Message from Dietrich Pates V, MD sent at 05/08/2022  3:57 PM EDT ----- Cr is significantly elevated     Fluid is up   Try taking torsemide 2 tables at once    Check BMET and BNP  in 10 days These labs need to be sent to PCP and kidney service Repeat BMET and BNP  SHe should have appt in renal clinci

## 2022-05-08 NOTE — Progress Notes (Unsigned)
  Care Coordination  Outreach Note  05/08/2022 Name: Tiffany [name] MRN: [id number] DOB: [birth year]   Care Coordination Outreach Attempts: A second unsuccessful outreach was attempted today to offer the patient with information about available care coordination services as a benefit of their health plan.     Follow Up Plan:  Additional outreach attempts will be made to offer the patient care coordination information and services.   Encounter Outcome:  No Answer  Christie Nottingham  Care Coordination Care Guide  Direct Dial: [phone]

## 2022-05-08 NOTE — Telephone Encounter (Signed)
Unable to leave the pt a message.   LM for  Will (Nurse Case Manager)  but the pt had previously said that he was not seeing her any more but I have taken recent calls from him.   I left a message for him to see if he is still caring for her and if not, who is so that I can reach out to someone re: her labs and not sure if she has or is seeing a renal MD.   [phone]

## 2022-05-09 NOTE — Telephone Encounter (Signed)
Will returned RN's call.

## 2022-05-09 NOTE — Telephone Encounter (Signed)
Returned call to Will with [facility]. He confirms he is still seeing the patient (mostly via telephone, but will be seeing the patient on Friday 05/12/22).  Will states according to his records patient does not have nephrologist. He states he is able to view patient's labs through Epic.  Forwarding to Jeannett Senior, RN to follow-up when she is back in the office tomorrow 05/10/22.  Will states he will be available to discuss further with Dewayne Hatch tomorrow.

## 2022-05-10 MED ORDER — TORSEMIDE 20 MG PO TABS: 40.0000 mg | ORAL_TABLET | Freq: Every day | ORAL | 1 refills | Status: DC

## 2022-05-10 NOTE — Telephone Encounter (Signed)
I called and spoke with the pt and she verbalized understanding and she agrees to seeing a Kidney MD.    Labs 05/19/22... labs sent to her PCP.

## 2022-05-10 NOTE — Progress Notes (Signed)
  Care Coordination  Outreach Note  05/10/2022 Name: TANISA LAGACE MRN: [id number] DOB: 07-23-1960   Care Coordination Outreach Attempts: A third unsuccessful outreach was attempted today to offer the patient with information about available care coordination services as a benefit of their health plan.   Follow Up Plan:  No further outreach attempts will be made at this time. We have been unable to contact the patient to offer or enroll patient in care coordination services  Encounter Outcome:  No Answer  Christie Nottingham  Care Coordination Care Guide  Direct Dial: [phone]

## 2022-05-11 DIAGNOSIS — J9612 Chronic respiratory failure with hypercapnia: Secondary | ICD-10-CM | POA: Diagnosis not present

## 2022-05-11 DIAGNOSIS — J9611 Chronic respiratory failure with hypoxia: Secondary | ICD-10-CM | POA: Diagnosis not present

## 2022-05-11 DIAGNOSIS — M159 Polyosteoarthritis, unspecified: Secondary | ICD-10-CM | POA: Diagnosis not present

## 2022-05-11 DIAGNOSIS — J4489 Other specified chronic obstructive pulmonary disease: Secondary | ICD-10-CM | POA: Diagnosis not present

## 2022-05-11 DIAGNOSIS — G4733 Obstructive sleep apnea (adult) (pediatric): Secondary | ICD-10-CM | POA: Diagnosis not present

## 2022-05-11 DIAGNOSIS — K5730 Diverticulosis of large intestine without perforation or abscess without bleeding: Secondary | ICD-10-CM | POA: Diagnosis not present

## 2022-05-11 DIAGNOSIS — I5033 Acute on chronic diastolic (congestive) heart failure: Secondary | ICD-10-CM | POA: Diagnosis not present

## 2022-05-11 DIAGNOSIS — D62 Acute posthemorrhagic anemia: Secondary | ICD-10-CM | POA: Diagnosis not present

## 2022-05-11 DIAGNOSIS — I2510 Atherosclerotic heart disease of native coronary artery without angina pectoris: Secondary | ICD-10-CM | POA: Diagnosis not present

## 2022-05-11 DIAGNOSIS — G894 Chronic pain syndrome: Secondary | ICD-10-CM | POA: Diagnosis not present

## 2022-05-11 DIAGNOSIS — E1142 Type 2 diabetes mellitus with diabetic polyneuropathy: Secondary | ICD-10-CM | POA: Diagnosis not present

## 2022-05-11 DIAGNOSIS — J439 Emphysema, unspecified: Secondary | ICD-10-CM | POA: Diagnosis not present

## 2022-05-11 DIAGNOSIS — D631 Anemia in chronic kidney disease: Secondary | ICD-10-CM | POA: Diagnosis not present

## 2022-05-11 DIAGNOSIS — M542 Cervicalgia: Secondary | ICD-10-CM | POA: Diagnosis not present

## 2022-05-11 DIAGNOSIS — I252 Old myocardial infarction: Secondary | ICD-10-CM | POA: Diagnosis not present

## 2022-05-11 DIAGNOSIS — E1122 Type 2 diabetes mellitus with diabetic chronic kidney disease: Secondary | ICD-10-CM | POA: Diagnosis not present

## 2022-05-11 DIAGNOSIS — G931 Anoxic brain damage, not elsewhere classified: Secondary | ICD-10-CM | POA: Diagnosis not present

## 2022-05-11 DIAGNOSIS — E785 Hyperlipidemia, unspecified: Secondary | ICD-10-CM | POA: Diagnosis not present

## 2022-05-11 DIAGNOSIS — N184 Chronic kidney disease, stage 4 (severe): Secondary | ICD-10-CM | POA: Diagnosis not present

## 2022-05-11 DIAGNOSIS — I130 Hypertensive heart and chronic kidney disease with heart failure and stage 1 through stage 4 chronic kidney disease, or unspecified chronic kidney disease: Secondary | ICD-10-CM | POA: Diagnosis not present

## 2022-05-11 DIAGNOSIS — M1030 Gout due to renal impairment, unspecified site: Secondary | ICD-10-CM | POA: Diagnosis not present

## 2022-05-11 NOTE — Telephone Encounter (Signed)
Labs sent to Dr Arrie Aran and Evelena Leyden. (Nephrology)

## 2022-05-11 NOTE — Telephone Encounter (Signed)
Request sent to PCP

## 2022-05-12 ENCOUNTER — Telehealth: Payer: Self-pay | Admitting: Cardiology

## 2022-05-12 NOTE — Telephone Encounter (Signed)
Will from [facility] is returning call to Dewayne Hatch, RN in regards to labs. Requesting return call.

## 2022-05-12 NOTE — Telephone Encounter (Signed)
Spoke with Will of [facility] who states patient is still being followed by [facility].

## 2022-05-15 ENCOUNTER — Telehealth: Payer: Self-pay | Admitting: Student

## 2022-05-15 NOTE — Telephone Encounter (Signed)
Contacted Tiffany [name] to schedule their annual wellness visit. Appointment made for 05/22/2022.  Thank you,  [facility] Support [facility] Direct dial  [phone]

## 2022-05-15 NOTE — Telephone Encounter (Signed)
I spoke with [facility] with [facility] and gave him the latest update and that the pt will be having labs 05/19/22.   I advised him that I sent her Labs to her PCP and Dr Arrie Aran.Marland KitchenMarland KitchenI am not sure if she has heard from them.   He said he tried to see her for a planned visit this past Friday but she was not at home and was not answering her phone.   I will call him after we get his next set of labs back.

## 2022-05-16 ENCOUNTER — Other Ambulatory Visit: Payer: Self-pay

## 2022-05-16 ENCOUNTER — Telehealth: Payer: Self-pay | Admitting: Internal Medicine

## 2022-05-16 DIAGNOSIS — N179 Acute kidney failure, unspecified: Secondary | ICD-10-CM

## 2022-05-16 NOTE — Telephone Encounter (Signed)
New referral has been placed.  °

## 2022-05-16 NOTE — Telephone Encounter (Signed)
[facility] called in stating pt is a [place] resident and their office is in [place] and [place]. They ask that a new referral be sent to [facility] in [place] instead.

## 2022-05-17 DIAGNOSIS — D62 Acute posthemorrhagic anemia: Secondary | ICD-10-CM | POA: Diagnosis not present

## 2022-05-17 DIAGNOSIS — J4489 Other specified chronic obstructive pulmonary disease: Secondary | ICD-10-CM | POA: Diagnosis not present

## 2022-05-17 DIAGNOSIS — G4733 Obstructive sleep apnea (adult) (pediatric): Secondary | ICD-10-CM | POA: Diagnosis not present

## 2022-05-17 DIAGNOSIS — E1142 Type 2 diabetes mellitus with diabetic polyneuropathy: Secondary | ICD-10-CM | POA: Diagnosis not present

## 2022-05-17 DIAGNOSIS — I130 Hypertensive heart and chronic kidney disease with heart failure and stage 1 through stage 4 chronic kidney disease, or unspecified chronic kidney disease: Secondary | ICD-10-CM | POA: Diagnosis not present

## 2022-05-17 DIAGNOSIS — D631 Anemia in chronic kidney disease: Secondary | ICD-10-CM | POA: Diagnosis not present

## 2022-05-17 DIAGNOSIS — M1030 Gout due to renal impairment, unspecified site: Secondary | ICD-10-CM | POA: Diagnosis not present

## 2022-05-17 DIAGNOSIS — N184 Chronic kidney disease, stage 4 (severe): Secondary | ICD-10-CM | POA: Diagnosis not present

## 2022-05-17 DIAGNOSIS — E785 Hyperlipidemia, unspecified: Secondary | ICD-10-CM | POA: Diagnosis not present

## 2022-05-17 DIAGNOSIS — G931 Anoxic brain damage, not elsewhere classified: Secondary | ICD-10-CM | POA: Diagnosis not present

## 2022-05-17 DIAGNOSIS — E1122 Type 2 diabetes mellitus with diabetic chronic kidney disease: Secondary | ICD-10-CM | POA: Diagnosis not present

## 2022-05-17 DIAGNOSIS — J9612 Chronic respiratory failure with hypercapnia: Secondary | ICD-10-CM | POA: Diagnosis not present

## 2022-05-17 DIAGNOSIS — J439 Emphysema, unspecified: Secondary | ICD-10-CM | POA: Diagnosis not present

## 2022-05-17 DIAGNOSIS — G894 Chronic pain syndrome: Secondary | ICD-10-CM | POA: Diagnosis not present

## 2022-05-17 DIAGNOSIS — I252 Old myocardial infarction: Secondary | ICD-10-CM | POA: Diagnosis not present

## 2022-05-17 DIAGNOSIS — M159 Polyosteoarthritis, unspecified: Secondary | ICD-10-CM | POA: Diagnosis not present

## 2022-05-17 DIAGNOSIS — I5033 Acute on chronic diastolic (congestive) heart failure: Secondary | ICD-10-CM | POA: Diagnosis not present

## 2022-05-17 DIAGNOSIS — I2510 Atherosclerotic heart disease of native coronary artery without angina pectoris: Secondary | ICD-10-CM | POA: Diagnosis not present

## 2022-05-17 DIAGNOSIS — K5730 Diverticulosis of large intestine without perforation or abscess without bleeding: Secondary | ICD-10-CM | POA: Diagnosis not present

## 2022-05-17 DIAGNOSIS — J9611 Chronic respiratory failure with hypoxia: Secondary | ICD-10-CM | POA: Diagnosis not present

## 2022-05-17 DIAGNOSIS — Z9181 History of falling: Secondary | ICD-10-CM | POA: Diagnosis not present

## 2022-05-17 DIAGNOSIS — M542 Cervicalgia: Secondary | ICD-10-CM | POA: Diagnosis not present

## 2022-05-17 DIAGNOSIS — M170 Bilateral primary osteoarthritis of knee: Secondary | ICD-10-CM | POA: Diagnosis not present

## 2022-05-17 MED ORDER — PRAZOSIN HCL 2 MG PO CAPS: ORAL_CAPSULE | ORAL | 2 refills | Status: DC

## 2022-05-17 MED ORDER — LAMOTRIGINE 25 MG PO TABS: 25.0000 mg | ORAL_TABLET | Freq: Two times a day (BID) | ORAL | 0 refills | Status: DC

## 2022-05-18 ENCOUNTER — Other Ambulatory Visit: Payer: Self-pay | Admitting: Student

## 2022-05-19 ENCOUNTER — Ambulatory Visit: Payer: 59 | Attending: Internal Medicine

## 2022-05-19 DIAGNOSIS — I5033 Acute on chronic diastolic (congestive) heart failure: Secondary | ICD-10-CM

## 2022-05-19 DIAGNOSIS — I5032 Chronic diastolic (congestive) heart failure: Secondary | ICD-10-CM | POA: Diagnosis not present

## 2022-05-19 DIAGNOSIS — I2510 Atherosclerotic heart disease of native coronary artery without angina pectoris: Secondary | ICD-10-CM | POA: Diagnosis not present

## 2022-05-19 DIAGNOSIS — R7989 Other specified abnormal findings of blood chemistry: Secondary | ICD-10-CM

## 2022-05-19 DIAGNOSIS — N189 Chronic kidney disease, unspecified: Secondary | ICD-10-CM

## 2022-05-19 DIAGNOSIS — E7849 Other hyperlipidemia: Secondary | ICD-10-CM

## 2022-05-19 DIAGNOSIS — Z79899 Other long term (current) drug therapy: Secondary | ICD-10-CM | POA: Diagnosis not present

## 2022-05-19 DIAGNOSIS — N179 Acute kidney failure, unspecified: Secondary | ICD-10-CM | POA: Diagnosis not present

## 2022-05-19 NOTE — Telephone Encounter (Signed)
This patient has eGFR of 13 and CrCl of <30. Systemic diclofenac is contraindicated.

## 2022-05-20 LAB — BASIC METABOLIC PANEL
BUN/Creatinine Ratio: 12 (ref 12–28)
BUN: 49 mg/dL — ABNORMAL HIGH (ref 8–27)
CO2: 23 mmol/L (ref 20–29)
Calcium: 9 mg/dL (ref 8.7–10.3)
Chloride: 105 mmol/L (ref 96–106)
Creatinine, Ser: 4.19 mg/dL — ABNORMAL HIGH (ref 0.57–1.00)
Glucose: 85 mg/dL (ref 70–99)
Potassium: 4.1 mmol/L (ref 3.5–5.2)
Sodium: 144 mmol/L (ref 134–144)
eGFR: 11 mL/min/{1.73_m2} — ABNORMAL LOW (ref >59)

## 2022-05-20 LAB — PRO B NATRIURETIC PEPTIDE: NT-Pro BNP: 2269 pg/mL — ABNORMAL HIGH (ref 0–287)

## 2022-05-22 ENCOUNTER — Telehealth: Payer: Self-pay

## 2022-05-22 ENCOUNTER — Ambulatory Visit: Payer: 59

## 2022-05-22 DIAGNOSIS — R269 Unspecified abnormalities of gait and mobility: Secondary | ICD-10-CM | POA: Diagnosis not present

## 2022-05-22 DIAGNOSIS — M79606 Pain in leg, unspecified: Secondary | ICD-10-CM | POA: Diagnosis not present

## 2022-05-22 DIAGNOSIS — I509 Heart failure, unspecified: Secondary | ICD-10-CM | POA: Diagnosis not present

## 2022-05-22 DIAGNOSIS — E782 Mixed hyperlipidemia: Secondary | ICD-10-CM | POA: Diagnosis not present

## 2022-05-22 DIAGNOSIS — R197 Diarrhea, unspecified: Secondary | ICD-10-CM | POA: Diagnosis not present

## 2022-05-22 DIAGNOSIS — I10 Essential (primary) hypertension: Secondary | ICD-10-CM | POA: Diagnosis not present

## 2022-05-22 DIAGNOSIS — N189 Chronic kidney disease, unspecified: Secondary | ICD-10-CM | POA: Diagnosis not present

## 2022-05-22 DIAGNOSIS — E119 Type 2 diabetes mellitus without complications: Secondary | ICD-10-CM | POA: Diagnosis not present

## 2022-05-22 DIAGNOSIS — M5459 Other low back pain: Secondary | ICD-10-CM | POA: Diagnosis not present

## 2022-05-22 MED ORDER — TORSEMIDE 20 MG PO TABS: 20.0000 mg | ORAL_TABLET | Freq: Every day | ORAL | 3 refills | Status: DC

## 2022-05-22 NOTE — Telephone Encounter (Signed)
-----   Message from Dietrich Pates V, MD sent at 05/22/2022  4:09 PM EDT ----- Cr is increased at 4.19    Fluid is elevated   Hold torsemide for 1 day then resume at only one tab per day Refer to nephrology

## 2022-05-22 NOTE — Telephone Encounter (Signed)
The patient has been notified of the result and verbalized understanding.  All questions (if any) were answered. Frutoso Schatz, RN 05/22/2022 4:17 PM  Referral previously placed for patient to see nephrology.

## 2022-05-25 DIAGNOSIS — R062 Wheezing: Secondary | ICD-10-CM | POA: Diagnosis not present

## 2022-05-25 DIAGNOSIS — G4733 Obstructive sleep apnea (adult) (pediatric): Secondary | ICD-10-CM | POA: Diagnosis not present

## 2022-05-25 DIAGNOSIS — J45909 Unspecified asthma, uncomplicated: Secondary | ICD-10-CM | POA: Diagnosis not present

## 2022-05-26 ENCOUNTER — Telehealth (HOSPITAL_COMMUNITY): Payer: Self-pay | Admitting: Licensed Clinical Social Worker

## 2022-05-26 DIAGNOSIS — N184 Chronic kidney disease, stage 4 (severe): Secondary | ICD-10-CM | POA: Diagnosis not present

## 2022-05-26 DIAGNOSIS — I5032 Chronic diastolic (congestive) heart failure: Secondary | ICD-10-CM | POA: Diagnosis not present

## 2022-05-26 DIAGNOSIS — J449 Chronic obstructive pulmonary disease, unspecified: Secondary | ICD-10-CM | POA: Diagnosis not present

## 2022-05-26 DIAGNOSIS — E1122 Type 2 diabetes mellitus with diabetic chronic kidney disease: Secondary | ICD-10-CM | POA: Diagnosis not present

## 2022-05-26 DIAGNOSIS — I130 Hypertensive heart and chronic kidney disease with heart failure and stage 1 through stage 4 chronic kidney disease, or unspecified chronic kidney disease: Secondary | ICD-10-CM | POA: Diagnosis not present

## 2022-05-26 NOTE — Telephone Encounter (Signed)
H&V Care Navigation [name] Progress Note  Clinical Social Worker received referral from [facility] EMS services regarding this Heart and Vascular patient and her barriers to transportation.  Informed that the pt is currently calling EMS multiple times a day to get assistance getting out of her home.  [name] called pt to assess and she confirms she is unable to get from the home without assistance from EMS.  Pt is in a wheelchair and her apartment has 3 stairs to get out of her home which she can't navigate.  Pt reports she has spoken to her apartment about this and has been informed that they won't provide a ramp for her but they will allow her to find someone to install a ramp.  [name] provided number for apartment site worker [phone]) but [name] attempted mulitple calls and phone just keeps ringing. Pt will also try and will pass along [name] number to the apartment staff.  [name] placed call to [facility] who has assisted with building a ramp for a past Heart and Vascular patient.  [name] will continue to follow and assist as needed   SDOH Screenings   Food Insecurity: Patient Declined (02/05/2022)  Housing: Medium Risk (02/07/2022)  Transportation Needs: Unmet Transportation Needs (02/07/2022)  Utilities: Patient Declined (02/05/2022)  Alcohol Screen: Low Risk  (07/24/2021)  Depression (PHQ2-9): Medium Risk (04/03/2022)  Financial Resource Strain: Medium Risk (07/24/2021)  Physical Activity: Inactive (07/24/2021)  Social Connections: Moderately Isolated (07/24/2021)  Stress: Stress Concern Present (07/24/2021)  Tobacco Use: Medium Risk (05/05/2022)    Burna Sis, LCSW Clinical Social Worker [facility] Desk#: [phone] Cell#: [phone]

## 2022-06-01 ENCOUNTER — Telehealth (HOSPITAL_COMMUNITY): Payer: Self-pay | Admitting: Licensed Clinical Social Worker

## 2022-06-01 DIAGNOSIS — M1030 Gout due to renal impairment, unspecified site: Secondary | ICD-10-CM | POA: Diagnosis not present

## 2022-06-01 DIAGNOSIS — J4489 Other specified chronic obstructive pulmonary disease: Secondary | ICD-10-CM | POA: Diagnosis not present

## 2022-06-01 DIAGNOSIS — E1142 Type 2 diabetes mellitus with diabetic polyneuropathy: Secondary | ICD-10-CM | POA: Diagnosis not present

## 2022-06-01 DIAGNOSIS — M542 Cervicalgia: Secondary | ICD-10-CM | POA: Diagnosis not present

## 2022-06-01 DIAGNOSIS — J439 Emphysema, unspecified: Secondary | ICD-10-CM | POA: Diagnosis not present

## 2022-06-01 DIAGNOSIS — M159 Polyosteoarthritis, unspecified: Secondary | ICD-10-CM | POA: Diagnosis not present

## 2022-06-01 DIAGNOSIS — D62 Acute posthemorrhagic anemia: Secondary | ICD-10-CM | POA: Diagnosis not present

## 2022-06-01 DIAGNOSIS — J9611 Chronic respiratory failure with hypoxia: Secondary | ICD-10-CM | POA: Diagnosis not present

## 2022-06-01 DIAGNOSIS — K5730 Diverticulosis of large intestine without perforation or abscess without bleeding: Secondary | ICD-10-CM | POA: Diagnosis not present

## 2022-06-01 DIAGNOSIS — I252 Old myocardial infarction: Secondary | ICD-10-CM | POA: Diagnosis not present

## 2022-06-01 DIAGNOSIS — G931 Anoxic brain damage, not elsewhere classified: Secondary | ICD-10-CM | POA: Diagnosis not present

## 2022-06-01 DIAGNOSIS — E785 Hyperlipidemia, unspecified: Secondary | ICD-10-CM | POA: Diagnosis not present

## 2022-06-01 DIAGNOSIS — I5033 Acute on chronic diastolic (congestive) heart failure: Secondary | ICD-10-CM | POA: Diagnosis not present

## 2022-06-01 DIAGNOSIS — G894 Chronic pain syndrome: Secondary | ICD-10-CM | POA: Diagnosis not present

## 2022-06-01 DIAGNOSIS — N184 Chronic kidney disease, stage 4 (severe): Secondary | ICD-10-CM | POA: Diagnosis not present

## 2022-06-01 DIAGNOSIS — G4733 Obstructive sleep apnea (adult) (pediatric): Secondary | ICD-10-CM | POA: Diagnosis not present

## 2022-06-01 DIAGNOSIS — I2510 Atherosclerotic heart disease of native coronary artery without angina pectoris: Secondary | ICD-10-CM | POA: Diagnosis not present

## 2022-06-01 DIAGNOSIS — J9612 Chronic respiratory failure with hypercapnia: Secondary | ICD-10-CM | POA: Diagnosis not present

## 2022-06-01 DIAGNOSIS — E1122 Type 2 diabetes mellitus with diabetic chronic kidney disease: Secondary | ICD-10-CM | POA: Diagnosis not present

## 2022-06-01 DIAGNOSIS — I130 Hypertensive heart and chronic kidney disease with heart failure and stage 1 through stage 4 chronic kidney disease, or unspecified chronic kidney disease: Secondary | ICD-10-CM | POA: Diagnosis not present

## 2022-06-01 DIAGNOSIS — D631 Anemia in chronic kidney disease: Secondary | ICD-10-CM | POA: Diagnosis not present

## 2022-06-01 NOTE — Telephone Encounter (Signed)
H&V Care Navigation [name] Progress Note  Clinical Social Worker working with pt to figure out getting ramp installed at home so she doesn't have to call EMS every time she needs to leave her residence.  [name] able to find organization to complete ramp installation- [employer]- they are working to figure out which church will complete the project at this time.  [name] called management company who is confirm with manager that they will allow this installation.   SDOH Screenings   Food Insecurity: Patient Declined (02/05/2022)  Housing: Medium Risk (02/07/2022)  Transportation Needs: Unmet Transportation Needs (02/07/2022)  Utilities: Patient Declined (02/05/2022)  Alcohol Screen: Low Risk  (07/24/2021)  Depression (PHQ2-9): Medium Risk (04/03/2022)  Financial Resource Strain: Medium Risk (07/24/2021)  Physical Activity: Inactive (07/24/2021)  Social Connections: Moderately Isolated (07/24/2021)  Stress: Stress Concern Present (07/24/2021)  Tobacco Use: Medium Risk (05/05/2022)    Burna Sis, LCSW Clinical Social Worker [facility] Desk#: [phone] Cell#: [phone]

## 2022-06-02 ENCOUNTER — Encounter (HOSPITAL_COMMUNITY): Payer: Self-pay

## 2022-06-02 ENCOUNTER — Emergency Department (HOSPITAL_COMMUNITY): Payer: 59

## 2022-06-02 ENCOUNTER — Inpatient Hospital Stay (HOSPITAL_COMMUNITY)
Admission: EM | Admit: 2022-06-02 | Discharge: 2022-06-08 | DRG: 682 | Disposition: A | Discharge status: Home Health | Payer: 59 | Attending: Internal Medicine | Admitting: Internal Medicine

## 2022-06-02 DIAGNOSIS — I5032 Chronic diastolic (congestive) heart failure: Secondary | ICD-10-CM | POA: Diagnosis present

## 2022-06-02 DIAGNOSIS — F4312 Post-traumatic stress disorder, chronic: Secondary | ICD-10-CM | POA: Diagnosis present

## 2022-06-02 DIAGNOSIS — N179 Acute kidney failure, unspecified: Secondary | ICD-10-CM | POA: Diagnosis present

## 2022-06-02 DIAGNOSIS — E1140 Type 2 diabetes mellitus with diabetic neuropathy, unspecified: Secondary | ICD-10-CM | POA: Diagnosis not present

## 2022-06-02 DIAGNOSIS — F250 Schizoaffective disorder, bipolar type: Secondary | ICD-10-CM | POA: Diagnosis present

## 2022-06-02 DIAGNOSIS — I252 Old myocardial infarction: Secondary | ICD-10-CM

## 2022-06-02 DIAGNOSIS — D649 Anemia, unspecified: Secondary | ICD-10-CM | POA: Diagnosis not present

## 2022-06-02 DIAGNOSIS — L89152 Pressure ulcer of sacral region, stage 2: Secondary | ICD-10-CM | POA: Diagnosis present

## 2022-06-02 DIAGNOSIS — Z79899 Other long term (current) drug therapy: Secondary | ICD-10-CM | POA: Diagnosis not present

## 2022-06-02 DIAGNOSIS — Z1152 Encounter for screening for COVID-19: Secondary | ICD-10-CM | POA: Diagnosis not present

## 2022-06-02 DIAGNOSIS — M25551 Pain in right hip: Secondary | ICD-10-CM | POA: Diagnosis not present

## 2022-06-02 DIAGNOSIS — Z87891 Personal history of nicotine dependence: Secondary | ICD-10-CM | POA: Diagnosis not present

## 2022-06-02 DIAGNOSIS — I129 Hypertensive chronic kidney disease with stage 1 through stage 4 chronic kidney disease, or unspecified chronic kidney disease: Secondary | ICD-10-CM | POA: Diagnosis not present

## 2022-06-02 DIAGNOSIS — G894 Chronic pain syndrome: Secondary | ICD-10-CM | POA: Diagnosis not present

## 2022-06-02 DIAGNOSIS — Z743 Need for continuous supervision: Secondary | ICD-10-CM | POA: Diagnosis not present

## 2022-06-02 DIAGNOSIS — Z818 Family history of other mental and behavioral disorders: Secondary | ICD-10-CM

## 2022-06-02 DIAGNOSIS — R0902 Hypoxemia: Secondary | ICD-10-CM | POA: Diagnosis not present

## 2022-06-02 DIAGNOSIS — R809 Proteinuria, unspecified: Secondary | ICD-10-CM | POA: Diagnosis present

## 2022-06-02 DIAGNOSIS — J9611 Chronic respiratory failure with hypoxia: Secondary | ICD-10-CM | POA: Diagnosis not present

## 2022-06-02 DIAGNOSIS — N184 Chronic kidney disease, stage 4 (severe): Secondary | ICD-10-CM | POA: Diagnosis present

## 2022-06-02 DIAGNOSIS — F259 Schizoaffective disorder, unspecified: Secondary | ICD-10-CM

## 2022-06-02 DIAGNOSIS — E1122 Type 2 diabetes mellitus with diabetic chronic kidney disease: Secondary | ICD-10-CM | POA: Diagnosis present

## 2022-06-02 DIAGNOSIS — I5031 Acute diastolic (congestive) heart failure: Secondary | ICD-10-CM | POA: Diagnosis not present

## 2022-06-02 DIAGNOSIS — Z7409 Other reduced mobility: Secondary | ICD-10-CM | POA: Diagnosis present

## 2022-06-02 DIAGNOSIS — W1830XA Fall on same level, unspecified, initial encounter: Secondary | ICD-10-CM | POA: Diagnosis present

## 2022-06-02 DIAGNOSIS — Z9181 History of falling: Secondary | ICD-10-CM | POA: Diagnosis not present

## 2022-06-02 DIAGNOSIS — J439 Emphysema, unspecified: Secondary | ICD-10-CM | POA: Diagnosis present

## 2022-06-02 DIAGNOSIS — M1030 Gout due to renal impairment, unspecified site: Secondary | ICD-10-CM | POA: Diagnosis not present

## 2022-06-02 DIAGNOSIS — E785 Hyperlipidemia, unspecified: Secondary | ICD-10-CM | POA: Diagnosis present

## 2022-06-02 DIAGNOSIS — G9340 Encephalopathy, unspecified: Secondary | ICD-10-CM | POA: Diagnosis present

## 2022-06-02 DIAGNOSIS — N1832 Chronic kidney disease, stage 3b: Secondary | ICD-10-CM

## 2022-06-02 DIAGNOSIS — R531 Weakness: Secondary | ICD-10-CM | POA: Diagnosis not present

## 2022-06-02 DIAGNOSIS — M25552 Pain in left hip: Secondary | ICD-10-CM | POA: Diagnosis not present

## 2022-06-02 DIAGNOSIS — Z91048 Other nonmedicinal substance allergy status: Secondary | ICD-10-CM

## 2022-06-02 DIAGNOSIS — N183 Chronic kidney disease, stage 3 unspecified: Secondary | ICD-10-CM | POA: Diagnosis not present

## 2022-06-02 DIAGNOSIS — F419 Anxiety disorder, unspecified: Secondary | ICD-10-CM | POA: Diagnosis present

## 2022-06-02 DIAGNOSIS — R823 Hemoglobinuria: Secondary | ICD-10-CM | POA: Diagnosis present

## 2022-06-02 DIAGNOSIS — E538 Deficiency of other specified B group vitamins: Secondary | ICD-10-CM | POA: Diagnosis not present

## 2022-06-02 DIAGNOSIS — E872 Acidosis, unspecified: Secondary | ICD-10-CM | POA: Diagnosis not present

## 2022-06-02 DIAGNOSIS — M170 Bilateral primary osteoarthritis of knee: Secondary | ICD-10-CM | POA: Diagnosis not present

## 2022-06-02 DIAGNOSIS — E1142 Type 2 diabetes mellitus with diabetic polyneuropathy: Secondary | ICD-10-CM | POA: Diagnosis not present

## 2022-06-02 DIAGNOSIS — K5730 Diverticulosis of large intestine without perforation or abscess without bleeding: Secondary | ICD-10-CM | POA: Diagnosis not present

## 2022-06-02 DIAGNOSIS — Z8673 Personal history of transient ischemic attack (TIA), and cerebral infarction without residual deficits: Secondary | ICD-10-CM

## 2022-06-02 DIAGNOSIS — Z8674 Personal history of sudden cardiac arrest: Secondary | ICD-10-CM

## 2022-06-02 DIAGNOSIS — J189 Pneumonia, unspecified organism: Secondary | ICD-10-CM | POA: Diagnosis present

## 2022-06-02 DIAGNOSIS — J4489 Other specified chronic obstructive pulmonary disease: Secondary | ICD-10-CM | POA: Diagnosis not present

## 2022-06-02 DIAGNOSIS — N189 Chronic kidney disease, unspecified: Secondary | ICD-10-CM | POA: Diagnosis present

## 2022-06-02 DIAGNOSIS — Y92009 Unspecified place in unspecified non-institutional (private) residence as the place of occurrence of the external cause: Secondary | ICD-10-CM

## 2022-06-02 DIAGNOSIS — I890 Lymphedema, not elsewhere classified: Secondary | ICD-10-CM | POA: Diagnosis present

## 2022-06-02 DIAGNOSIS — G9341 Metabolic encephalopathy: Secondary | ICD-10-CM | POA: Diagnosis present

## 2022-06-02 DIAGNOSIS — K219 Gastro-esophageal reflux disease without esophagitis: Secondary | ICD-10-CM | POA: Diagnosis present

## 2022-06-02 DIAGNOSIS — M159 Polyosteoarthritis, unspecified: Secondary | ICD-10-CM | POA: Diagnosis not present

## 2022-06-02 DIAGNOSIS — M109 Gout, unspecified: Secondary | ICD-10-CM | POA: Diagnosis present

## 2022-06-02 DIAGNOSIS — I10 Essential (primary) hypertension: Secondary | ICD-10-CM | POA: Diagnosis not present

## 2022-06-02 DIAGNOSIS — I5033 Acute on chronic diastolic (congestive) heart failure: Secondary | ICD-10-CM | POA: Diagnosis not present

## 2022-06-02 DIAGNOSIS — Z993 Dependence on wheelchair: Secondary | ICD-10-CM

## 2022-06-02 DIAGNOSIS — R0602 Shortness of breath: Secondary | ICD-10-CM | POA: Diagnosis not present

## 2022-06-02 DIAGNOSIS — I130 Hypertensive heart and chronic kidney disease with heart failure and stage 1 through stage 4 chronic kidney disease, or unspecified chronic kidney disease: Secondary | ICD-10-CM | POA: Diagnosis not present

## 2022-06-02 DIAGNOSIS — M7989 Other specified soft tissue disorders: Secondary | ICD-10-CM | POA: Diagnosis not present

## 2022-06-02 DIAGNOSIS — G931 Anoxic brain damage, not elsewhere classified: Secondary | ICD-10-CM | POA: Diagnosis not present

## 2022-06-02 DIAGNOSIS — Z6835 Body mass index (BMI) 35.0-35.9, adult: Secondary | ICD-10-CM

## 2022-06-02 DIAGNOSIS — M542 Cervicalgia: Secondary | ICD-10-CM | POA: Diagnosis not present

## 2022-06-02 DIAGNOSIS — Z882 Allergy status to sulfonamides status: Secondary | ICD-10-CM

## 2022-06-02 DIAGNOSIS — Z885 Allergy status to narcotic agent status: Secondary | ICD-10-CM

## 2022-06-02 DIAGNOSIS — Z8249 Family history of ischemic heart disease and other diseases of the circulatory system: Secondary | ICD-10-CM

## 2022-06-02 DIAGNOSIS — J9612 Chronic respiratory failure with hypercapnia: Secondary | ICD-10-CM | POA: Diagnosis not present

## 2022-06-02 DIAGNOSIS — G4733 Obstructive sleep apnea (adult) (pediatric): Secondary | ICD-10-CM | POA: Diagnosis present

## 2022-06-02 DIAGNOSIS — I2510 Atherosclerotic heart disease of native coronary artery without angina pectoris: Secondary | ICD-10-CM | POA: Diagnosis present

## 2022-06-02 DIAGNOSIS — D62 Acute posthemorrhagic anemia: Secondary | ICD-10-CM | POA: Diagnosis not present

## 2022-06-02 DIAGNOSIS — D631 Anemia in chronic kidney disease: Secondary | ICD-10-CM | POA: Diagnosis present

## 2022-06-02 HISTORY — DX: Chronic kidney disease, unspecified: N17.9

## 2022-06-02 LAB — CBC WITH DIFFERENTIAL/PLATELET
Abs Immature Granulocytes: 0.02 10*3/uL (ref 0.00–0.07)
Basophils Absolute: 0 10*3/uL (ref 0.0–0.1)
Basophils Relative: 0 %
Eosinophils Absolute: 0.1 10*3/uL (ref 0.0–0.5)
Eosinophils Relative: 2 %
HCT: 27.7 % — ABNORMAL LOW (ref 36.0–46.0)
Hemoglobin: 8.3 g/dL — ABNORMAL LOW (ref 12.0–15.0)
Immature Granulocytes: 0 %
Lymphocytes Relative: 19 %
Lymphs Abs: 0.9 10*3/uL (ref 0.7–4.0)
MCH: 27.2 pg (ref 26.0–34.0)
MCHC: 30 g/dL (ref 30.0–36.0)
MCV: 90.8 fL (ref 80.0–100.0)
Monocytes Absolute: 0.5 10*3/uL (ref 0.1–1.0)
Monocytes Relative: 11 %
Neutro Abs: 3.1 10*3/uL (ref 1.7–7.7)
Neutrophils Relative %: 68 %
Platelets: 204 10*3/uL (ref 150–400)
RBC: 3.05 MIL/uL — ABNORMAL LOW (ref 3.87–5.11)
RDW: 17.6 % — ABNORMAL HIGH (ref 11.5–15.5)
WBC: 4.6 10*3/uL (ref 4.0–10.5)
nRBC: 0 % (ref 0.0–0.2)

## 2022-06-02 LAB — I-STAT VENOUS BLOOD GAS, ED
Acid-base deficit: 4 mmol/L — ABNORMAL HIGH (ref 0.0–2.0)
Bicarbonate: 21.3 mmol/L (ref 20.0–28.0)
Calcium, Ion: 1.16 mmol/L (ref 1.15–1.40)
HCT: 31 % — ABNORMAL LOW (ref 36.0–46.0)
Hemoglobin: 10.5 g/dL — ABNORMAL LOW (ref 12.0–15.0)
O2 Saturation: 62 %
Potassium: 5 mmol/L (ref 3.5–5.1)
Sodium: 141 mmol/L (ref 135–145)
TCO2: 22 mmol/L (ref 22–32)
pCO2, Ven: 38.1 mmHg — ABNORMAL LOW (ref 44–60)
pH, Ven: 7.356 (ref 7.25–7.43)
pO2, Ven: 34 mmHg (ref 32–45)

## 2022-06-02 LAB — COMPREHENSIVE METABOLIC PANEL
ALT: 9 U/L (ref 0–44)
AST: 18 U/L (ref 15–41)
Albumin: 3.5 g/dL (ref 3.5–5.0)
Alkaline Phosphatase: 137 U/L — ABNORMAL HIGH (ref 38–126)
Anion gap: 10 (ref 5–15)
BUN: 48 mg/dL — ABNORMAL HIGH (ref 8–23)
CO2: 22 mmol/L (ref 22–32)
Calcium: 8.9 mg/dL (ref 8.9–10.3)
Chloride: 107 mmol/L (ref 98–111)
Creatinine, Ser: 4.73 mg/dL — ABNORMAL HIGH (ref 0.44–1.00)
GFR, Estimated: 10 mL/min — ABNORMAL LOW (ref >60)
Glucose, Bld: 83 mg/dL (ref 70–99)
Potassium: 4.9 mmol/L (ref 3.5–5.1)
Sodium: 139 mmol/L (ref 135–145)
Total Bilirubin: 0.4 mg/dL (ref 0.3–1.2)
Total Protein: 6.6 g/dL (ref 6.5–8.1)

## 2022-06-02 LAB — URINALYSIS, ROUTINE W REFLEX MICROSCOPIC
Bilirubin Urine: NEGATIVE
Glucose, UA: NEGATIVE mg/dL
Ketones, ur: NEGATIVE mg/dL
Leukocytes,Ua: NEGATIVE
Nitrite: NEGATIVE
Protein, ur: >=300 mg/dL — AB
Specific Gravity, Urine: 1.009 (ref 1.005–1.030)
pH: 5 (ref 5.0–8.0)

## 2022-06-02 LAB — RESP PANEL BY RT-PCR (RSV, FLU A&B, COVID)  RVPGX2
Influenza A by PCR: NEGATIVE
Influenza B by PCR: NEGATIVE
Resp Syncytial Virus by PCR: NEGATIVE
SARS Coronavirus 2 by RT PCR: NEGATIVE

## 2022-06-02 LAB — AMMONIA: Ammonia: <10 umol/L (ref 9–35)

## 2022-06-02 LAB — MAGNESIUM: Magnesium: 2.3 mg/dL (ref 1.7–2.4)

## 2022-06-02 LAB — BRAIN NATRIURETIC PEPTIDE: B Natriuretic Peptide: 363.6 pg/mL — ABNORMAL HIGH (ref 0.0–100.0)

## 2022-06-02 LAB — TSH: TSH: 1.126 u[IU]/mL (ref 0.350–4.500)

## 2022-06-02 MED ORDER — SODIUM CHLORIDE 0.9 % IV SOLN
1.0000 g | Freq: Once | INTRAVENOUS | Status: AC
  Administered 2022-06-02: 1 g via INTRAVENOUS
  Filled 2022-06-02: qty 10

## 2022-06-02 MED ORDER — ALBUTEROL SULFATE (2.5 MG/3ML) 0.083% IN NEBU: 2.5000 mg | INHALATION_SOLUTION | Freq: Four times a day (QID) | RESPIRATORY_TRACT | Status: DC | PRN

## 2022-06-02 MED ORDER — ONDANSETRON HCL 4 MG/2ML IJ SOLN: 4.0000 mg | Freq: Four times a day (QID) | INTRAMUSCULAR | Status: DC | PRN

## 2022-06-02 MED ORDER — AMLODIPINE BESYLATE 5 MG PO TABS
5.0000 mg | ORAL_TABLET | Freq: Two times a day (BID) | ORAL | Status: DC
  Administered 2022-06-03 – 2022-06-05 (×6): 5 mg via ORAL
  Filled 2022-06-02 (×6): qty 1

## 2022-06-02 MED ORDER — ONDANSETRON HCL 4 MG PO TABS: 4.0000 mg | ORAL_TABLET | Freq: Four times a day (QID) | ORAL | Status: DC | PRN

## 2022-06-02 MED ORDER — LOPERAMIDE HCL 2 MG PO CAPS: 2.0000 mg | ORAL_CAPSULE | ORAL | Status: DC | PRN

## 2022-06-02 MED ORDER — ASENAPINE MALEATE 5 MG SL SUBL
5.0000 mg | SUBLINGUAL_TABLET | Freq: Two times a day (BID) | SUBLINGUAL | Status: DC
  Administered 2022-06-03 (×2): 5 mg via SUBLINGUAL
  Filled 2022-06-02 (×2): qty 1

## 2022-06-02 MED ORDER — ESCITALOPRAM OXALATE 20 MG PO TABS
20.0000 mg | ORAL_TABLET | Freq: Every day | ORAL | Status: DC
  Administered 2022-06-03: 20 mg via ORAL
  Filled 2022-06-02: qty 1

## 2022-06-02 MED ORDER — DULOXETINE HCL 30 MG PO CPEP
30.0000 mg | ORAL_CAPSULE | Freq: Every day | ORAL | Status: DC
  Administered 2022-06-03: 30 mg via ORAL
  Filled 2022-06-02: qty 1

## 2022-06-02 MED ORDER — MELATONIN 3 MG PO TABS
3.0000 mg | ORAL_TABLET | Freq: Every day | ORAL | Status: DC
  Administered 2022-06-03 – 2022-06-07 (×6): 3 mg via ORAL
  Filled 2022-06-02 (×6): qty 1

## 2022-06-02 MED ORDER — LAMOTRIGINE 25 MG PO TABS
25.0000 mg | ORAL_TABLET | Freq: Two times a day (BID) | ORAL | Status: DC
  Administered 2022-06-03 – 2022-06-04 (×4): 25 mg via ORAL
  Filled 2022-06-02 (×5): qty 1

## 2022-06-02 MED ORDER — CARVEDILOL 12.5 MG PO TABS
12.5000 mg | ORAL_TABLET | Freq: Two times a day (BID) | ORAL | Status: DC
  Administered 2022-06-03 – 2022-06-08 (×12): 12.5 mg via ORAL
  Filled 2022-06-02 (×12): qty 1

## 2022-06-02 MED ORDER — MELATONIN 3 MG PO TABS: 3.0000 mg | ORAL_TABLET | Freq: Every evening | ORAL | Status: DC | PRN

## 2022-06-02 MED ORDER — ATORVASTATIN CALCIUM 10 MG PO TABS
20.0000 mg | ORAL_TABLET | Freq: Every day | ORAL | Status: DC
  Administered 2022-06-03 – 2022-06-08 (×6): 20 mg via ORAL
  Filled 2022-06-02 (×6): qty 2

## 2022-06-02 MED ORDER — PRAZOSIN HCL 2 MG PO CAPS
2.0000 mg | ORAL_CAPSULE | Freq: Every day | ORAL | Status: DC
  Administered 2022-06-03 – 2022-06-07 (×6): 2 mg via ORAL
  Filled 2022-06-02 (×7): qty 1

## 2022-06-02 MED ORDER — HEPARIN SODIUM (PORCINE) 5000 UNIT/ML IJ SOLN
5000.0000 [IU] | Freq: Three times a day (TID) | INTRAMUSCULAR | Status: DC
  Administered 2022-06-02 – 2022-06-08 (×18): 5000 [IU] via SUBCUTANEOUS
  Filled 2022-06-02 (×18): qty 1

## 2022-06-02 MED ORDER — OLANZAPINE 5 MG PO TABS
5.0000 mg | ORAL_TABLET | Freq: Every day | ORAL | Status: DC
  Administered 2022-06-03 – 2022-06-07 (×6): 5 mg via ORAL
  Filled 2022-06-02 (×6): qty 1

## 2022-06-02 NOTE — ED Notes (Signed)
ED TO INPATIENT HANDOFF REPORT  ED Nurse Name and Phone #:  Johnna Acosta [phone]  S Name/Age/Gender Tiffany [name] 62 y.o. female Room/Bed: 023C/023C  Code Status   Code Status: Full Code  Home/SNF/Other Home Patient oriented to: self, place, time, and situation Is this baseline? Yes   Triage Complete: Triage complete  Chief Complaint Acute on chronic renal failure (HCC) [N17.9, N18.9]  Triage Note Pt BIBA from home. Pt stated her legs came out from under her, causing her to fall. Pt stated she was down for about 5-10 mins. EMS crew that came out stated she was more lethargic than normal, concerned for UTI. Pt has pitting edema, hx of CHF. Pt's sats were 86% on RA on EMS arrival.    Allergies Allergies  Allergen Reactions   Aspirin     GI bleed x2   Hydrocodone Shortness Of Breath   Latuda [Lurasidone Hcl] Anaphylaxis   Magnesium-Containing Compounds Anaphylaxis and Other (See Comments)    Tolerated Ensure   Prednisone Anaphylaxis, Swelling and Other (See Comments)    Tongue swelling, lip swelling, throat swelling, per pt    Tramadol Anaphylaxis, Swelling and Rash   Codeine Nausea And Vomiting and Rash   Trazodone Other (See Comments)    paranoia   Peanut-Containing Drug Products Hives    Raw peanuts, but can eat peanut butter   Topamax [Topiramate] Other (See Comments)    Increases paranoia   Sulfa Antibiotics Itching   Tape Rash   Zetia [Ezetimibe] Itching and Rash    Level of Care/Admitting Diagnosis ED Disposition     ED Disposition  Admit   Condition  --   Comment  Hospital Area: [facility] [[id number]]  Level of Care: Telemetry Medical [[id number]]  May admit patient to [facility] or [facility] if equivalent level of care is available:: No  Covid Evaluation: Confirmed COVID Negative  Diagnosis: Acute on chronic renal failure [facility]) [[id number]]  Admitting Physician: [name] Irish [[id number]]  Attending Physician: [name] Irish [phone]   Certification:: I certify this patient will need inpatient services for at least 2 midnights  Estimated Length of Stay: 4          B Medical/Surgery History Past Medical History:  Diagnosis Date   Agitation 11/22/2017   Anoxic brain injury (HCC) 09/08/2016   C. Arrest due to respiratory failure and COPD exacerbation   Anxiety    Arthritis    "all over" (04/10/2016)   Asthma 10/18/2010   Binge eating disorder    Cardiac arrest (HCC) 09/08/2016   PEA   Carotid artery stenosis    1-39% bilateral by dopplers 11/2016   Chronic diastolic (congestive) heart failure (HCC)    Chronic kidney disease, stage 3 (HCC)    Chronic pain syndrome 06/18/2012   Chronic post-traumatic stress disorder (PTSD) 05/27/2018   Chronic respiratory failure with hypoxia and hypercapnia (HCC) 06/22/2015   TRILOGY Vent >AVAPA-ES., Vt target 200-400, Max P 30 , PS max 20 , PS min 6-10 , E Max 6, E Min 4, Rate Auto AVAPS Rate 2 (titrate for pt comfort) , bleed O2 at 5l/m continuous flow .    CKD (chronic kidney disease) 02/03/2022   Closed displaced fracture of fifth metacarpal bone 03/21/2018   Cocaine use disorder, severe, in sustained remission (HCC) 12/17/2015   Complication of anesthesia    decreased bp, decreased heart rate   COPD (chronic obstructive pulmonary disease) (HCC) 07/08/2014   Depression    Diabetic neuropathy (HCC) 04/24/2011  Difficulty with speech 01/24/2018   Disorder of nervous system    Drug abuse (HCC) 11/21/2017   Dyslipidemia 04/24/2011   Elevated troponin 04/28/2012   Emphysema    Encephalopathy 11/21/2017   Essential hypertension 03/22/2016   Fibula fracture 07/10/2016   Frequent falls 10/11/2017   GERD (gastroesophageal reflux disease)    Gout 04/11/2017   Heart attack (HCC) 1980s   History of blood transfusion 1994   "couldn't stop bleeding from my period"   History of drug abuse in remission (HCC) 11/28/2015   Quit in 2017   Hyperlipidemia LDL goal <70     Incontinence    Manic depression (HCC)    Morbid obesity (HCC) 10/18/2010   Obstructive sleep apnea 10/18/2010   On home oxygen therapy    "6L; 24/7" (04/10/2016)   OSA on CPAP    "wear mask sometimes" (04/10/2016)   Paranoid (HCC)    "sometimes; I'm on RX for it" (04/10/2016)   Prolonged Q-T interval on ECG    Rectal bleeding 12/31/2015   Schizoaffective disorder, bipolar type (HCC) 04/05/2018   Seasonal allergies    Seborrheic keratoses 12/31/2013   Seizures (HCC)    "don't know what kind; last one was ~ 1 yr ago" (04/10/2016)   Sinus bradycardia    Stroke [facility]) 1980s   denies residual on 04/10/2016   Thrush 09/19/2013   Type 2 diabetes mellitus (HCC) 10/18/2010   Past Surgical History:  Procedure Laterality Date   CESAREAN SECTION  1997   COLONOSCOPY WITH PROPOFOL N/A 04/01/2021   Procedure: COLONOSCOPY WITH PROPOFOL;  Surgeon: Jeani Hawking, MD;  Location: [facility] ENDOSCOPY;  Service: Gastroenterology;  Laterality: N/A;  Rectal bleeding with drop in hemoglobin to 7.2 g/dL   HERNIA REPAIR     IR CHOLANGIOGRAM EXISTING TUBE  07/20/2016   IR PERC CHOLECYSTOSTOMY  05/10/2016   IR RADIOLOGIST EVAL & MGMT  06/08/2016   IR RADIOLOGIST EVAL & MGMT  06/29/2016   IR SINUS/FIST TUBE CHK-NON GI  07/12/2016   RIGHT/LEFT HEART CATH AND CORONARY ANGIOGRAPHY N/A 06/19/2017   Procedure: RIGHT/LEFT HEART CATH AND CORONARY ANGIOGRAPHY;  Surgeon: Dolores Patty, MD;  Location: [facility] INVASIVE CV LAB;  Service: Cardiovascular;  Laterality: N/A;   TIBIA IM NAIL INSERTION Right 07/12/2016   Procedure: INTRAMEDULLARY (IM) NAIL RIGHT TIBIA;  Surgeon: Tarry Kos, MD;  Location: [facility] OR;  Service: Orthopedics;  Laterality: Right;   UMBILICAL HERNIA REPAIR  ~ 1963   "that's why I don't have a belly button"   VAGINAL HYSTERECTOMY       A IV Location/Drains/Wounds Patient Lines/Drains/Airways Status     Active Line/Drains/Airways     Name Placement date Placement time Site Days   Peripheral IV 06/02/22 18 G  Anterior;Right;Upper Arm 06/02/22  1704  Arm  less than 1   Peripheral IV 06/02/22 Anterior;Distal;Left Forearm 06/02/22  1934  Forearm  less than 1   Pressure Injury Sacrum Mid Stage 2 -  Partial thickness loss of dermis presenting as a shallow open injury with a red, pink wound bed without slough. --  --  -- --   Wound / Incision (Open or Dehisced) 05/11/20 Heel Left open skin, pink and no drainage 05/11/20  --  Heel  752            Intake/Output Last 24 hours  Intake/Output Summary (Last 24 hours) at 06/02/2022 2141 Last data filed at 06/02/2022 2122 Gross per 24 hour  Intake 100 ml  Output 100 ml  Net 0 ml    Labs/Imaging Results for orders placed or performed during the hospital encounter of 06/02/22 (from the past 48 hour(s))  Brain natriuretic peptide     Status: Abnormal   Collection Time: 06/02/22  4:53 PM  Result Value Ref Range   B Natriuretic Peptide 363.6 (H) 0.0 - 100.0 pg/mL    Comment: Performed at [facility], [address], [place], [place] [address]  TSH     Status: None   Collection Time: 06/02/22  4:53 PM  Result Value Ref Range   TSH 1.126 0.350 - 4.500 uIU/mL    Comment: Performed by a 3rd Generation assay with a functional sensitivity of <=0.01 uIU/mL. Performed at [facility], [address] [place], [place] [address]   CBC with Differential     Status: Abnormal   Collection Time: 06/02/22  5:00 PM  Result Value Ref Range   WBC 4.6 4.0 - 10.5 K/uL   RBC 3.05 (L) 3.87 - 5.11 MIL/uL   Hemoglobin 8.3 (L) 12.0 - 15.0 g/dL   HCT 57.8 (L) 46.9 - 62.9 %   MCV 90.8 80.0 - 100.0 fL   MCH 27.2 26.0 - 34.0 pg   MCHC 30.0 30.0 - 36.0 g/dL   RDW 52.8 (H) 41.3 - 24.4 %   Platelets 204 150 - 400 K/uL   nRBC 0.0 0.0 - 0.2 %   Neutrophils Relative % 68 %   Neutro Abs 3.1 1.7 - 7.7 K/uL   Lymphocytes Relative 19 %   Lymphs Abs 0.9 0.7 - 4.0 K/uL   Monocytes Relative 11 %   Monocytes Absolute 0.5 0.1 - 1.0 K/uL   Eosinophils Relative 2 %    Eosinophils Absolute 0.1 0.0 - 0.5 K/uL   Basophils Relative 0 %   Basophils Absolute 0.0 0.0 - 0.1 K/uL   Immature Granulocytes 0 %   Abs Immature Granulocytes 0.02 0.00 - 0.07 K/uL    Comment: Performed at [facility], [address] [place], [place] [address]  Comprehensive metabolic panel     Status: Abnormal   Collection Time: 06/02/22  5:00 PM  Result Value Ref Range   Sodium 139 135 - 145 mmol/L   Potassium 4.9 3.5 - 5.1 mmol/L   Chloride 107 98 - 111 mmol/L   CO2 22 22 - 32 mmol/L   Glucose, Bld 83 70 - 99 mg/dL    Comment: Glucose reference range applies only to samples taken after fasting for at least 8 hours.   BUN 48 (H) 8 - 23 mg/dL   Creatinine, Ser 2.53 (H) 0.44 - 1.00 mg/dL   Calcium 8.9 8.9 - 66.4 mg/dL   Total Protein 6.6 6.5 - 8.1 g/dL   Albumin 3.5 3.5 - 5.0 g/dL   AST 18 15 - 41 U/L   ALT 9 0 - 44 U/L   Alkaline Phosphatase 137 (H) 38 - 126 U/L   Total Bilirubin 0.4 0.3 - 1.2 mg/dL   GFR, Estimated 10 (L) >60 mL/min    Comment: (NOTE) Calculated using the CKD-EPI Creatinine Equation (2021)    Anion gap 10 5 - 15    Comment: Performed at [facility], [address], [place], [place] [address]  Magnesium     Status: None   Collection Time: 06/02/22  5:00 PM  Result Value Ref Range   Magnesium 2.3 1.7 - 2.4 mg/dL    Comment: Performed at [facility], [address] [place], [place] [address]  Ammonia     Status: None   Collection Time: 06/02/22  5:00 PM  Result Value Ref Range   Ammonia <10 9 - 35 umol/L    Comment: Performed at [facility], [address], [place], [place] [address]  Resp panel by RT-PCR (RSV, Flu A&B, Covid) Anterior Nasal Swab     Status: None   Collection Time: 06/02/22  5:17 PM   Specimen: Anterior Nasal Swab  Result Value Ref Range   SARS Coronavirus 2 by RT PCR NEGATIVE NEGATIVE   Influenza A by PCR NEGATIVE NEGATIVE   Influenza B by PCR NEGATIVE NEGATIVE    Comment: (NOTE) The Xpert Xpress  SARS-CoV-2/FLU/RSV plus assay is intended as an aid in the diagnosis of influenza from Nasopharyngeal swab specimens and should not be used as a sole basis for treatment. Nasal washings and aspirates are unacceptable for Xpert Xpress SARS-CoV-2/FLU/RSV testing.  Fact Sheet for Patients: BloggerCourse.com  Fact Sheet for Healthcare Providers: SeriousBroker.it  This test is not yet approved or cleared by the [place] FDA and has been authorized for detection and/or diagnosis of SARS-CoV-2 by FDA under an Emergency Use Authorization (EUA). This EUA will remain in effect (meaning this test can be used) for the duration of the COVID-19 declaration under Section 564(b)(1) of the Act, 21 U.S.C. section 360bbb-3(b)(1), unless the authorization is terminated or revoked.     Resp Syncytial Virus by PCR NEGATIVE NEGATIVE    Comment: (NOTE) Fact Sheet for Patients: BloggerCourse.com  Fact Sheet for Healthcare Providers: SeriousBroker.it  This test is not yet approved or cleared by the [place] FDA and has been authorized for detection and/or diagnosis of SARS-CoV-2 by FDA under an Emergency Use Authorization (EUA). This EUA will remain in effect (meaning this test can be used) for the duration of the COVID-19 declaration under Section 564(b)(1) of the Act, 21 U.S.C. section 360bbb-3(b)(1), unless the authorization is terminated or revoked.  Performed at [facility], [address] [place], [place] [address]   Urinalysis, Routine w reflex microscopic -Urine, Catheterized     Status: Abnormal   Collection Time: 06/02/22  5:20 PM  Result Value Ref Range   Color, Urine YELLOW YELLOW   APPearance CLEAR CLEAR   Specific Gravity, Urine 1.009 1.005 - 1.030   pH 5.0 5.0 - 8.0   Glucose, UA NEGATIVE NEGATIVE mg/dL   Hgb urine dipstick MODERATE (A) NEGATIVE   Bilirubin Urine  NEGATIVE NEGATIVE   Ketones, ur NEGATIVE NEGATIVE mg/dL   Protein, ur >=914 (A) NEGATIVE mg/dL   Nitrite NEGATIVE NEGATIVE   Leukocytes,Ua NEGATIVE NEGATIVE   RBC / HPF 11-20 0 - 5 RBC/hpf   WBC, UA 0-5 0 - 5 WBC/hpf   Bacteria, UA RARE (A) NONE SEEN   Squamous Epithelial / HPF 0-5 0 - 5 /HPF    Comment: Performed at [facility], [address], [place], [place] [address]  I-Stat venous blood gas, [facility], [facility], [facility])     Status: Abnormal   Collection Time: 06/02/22  6:43 PM  Result Value Ref Range   pH, Ven 7.356 7.25 - 7.43   pCO2, Ven 38.1 (L) 44 - 60 mmHg   pO2, Ven 34 32 - 45 mmHg   Bicarbonate 21.3 20.0 - 28.0 mmol/L   TCO2 22 22 - 32 mmol/L   O2 Saturation 62 %   Acid-base deficit 4.0 (H) 0.0 - 2.0 mmol/L  Sodium 141 135 - 145 mmol/L   Potassium 5.0 3.5 - 5.1 mmol/L   Calcium, Ion 1.16 1.15 - 1.40 mmol/L   HCT 31.0 (L) 36.0 - 46.0 %   Hemoglobin 10.5 (L) 12.0 - 15.0 g/dL   Sample type VENOUS    Comment NOTIFIED PHYSICIAN    *Note: Due to a large number of results and/or encounters for the requested time period, some results have not been displayed. A complete set of results can be found in Results Review.   DG CHEST PORT 1 VIEW  Result Date: 06/02/2022 CLINICAL DATA:  Shortness of breath EXAM: PORTABLE CHEST 1 VIEW COMPARISON:  03/02/2022 FINDINGS: Cardiomegaly with mild central congestion. Possible patchy right mid and lower pulmonary opacities. Multiple old left-sided rib fractures. No pleural effusion or pneumothorax IMPRESSION: 1. Cardiomegaly with mild central congestion. 2. Possible patchy right mid and lower lung airspace disease which may be due to early pneumonia Electronically Signed   By: Jasmine Pang M.D.   On: 06/02/2022 18:16   CT Head Wo Contrast  Result Date: 06/02/2022 CLINICAL DATA:  Head trauma, abnormal mental status, fall EXAM: CT HEAD WITHOUT CONTRAST TECHNIQUE: Contiguous axial images were obtained from the base of the skull through the vertex  without intravenous contrast. RADIATION DOSE REDUCTION: This exam was performed according to the departmental dose-optimization program which includes automated exposure control, adjustment of the mA and/or kV according to patient size and/or use of iterative reconstruction technique. COMPARISON:  CT head 07/09/2021 FINDINGS: Brain: No intracranial hemorrhage, mass effect, or evidence of acute infarct. No hydrocephalus. No extra-axial fluid collection. Generalized cerebral atrophy. Ill-defined hypoattenuation within the cerebral white matter is nonspecific but consistent with chronic small vessel ischemic disease. Vascular: No hyperdense vessel. Intracranial arterial calcification. Skull: No fracture or focal lesion. Sinuses/Orbits: No acute finding. Paranasal sinuses and mastoid air cells are well aerated. Other: None. IMPRESSION: 1. No evidence of acute intracranial abnormality. 2. Atrophy and chronic small vessel ischemic disease. Electronically Signed   By: Minerva Fester M.D.   On: 06/02/2022 17:51    Pending Labs Unresulted Labs (From admission, onward)     Start     Ordered   06/03/22 0500  Basic metabolic panel  Tomorrow morning,   R        06/02/22 2108   06/03/22 0500  CBC  Tomorrow morning,   R        06/02/22 2108   06/03/22 0500  Brain natriuretic peptide  Tomorrow morning,   R        06/02/22 2108   06/02/22 1650  Urine Culture  Once,   URGENT       Question:  Indication  Answer:  Dysuria   06/02/22 1652            Vitals/Pain Today's Vitals   06/02/22 1640 06/02/22 1715 06/02/22 1804 06/02/22 1830  BP: 136/79 (!) 134/96 (!) 141/85 (!) 155/84  Pulse: (!) 58 61 64 64  Resp: 17 20 (!) 22 19  Temp: 97.9 F (36.6 C)     TempSrc: Oral     SpO2: 100% 100% 98% 100%  PainSc: 8        Isolation Precautions No active isolations  Medications Medications  heparin injection 5,000 Units (has no administration in time range)  melatonin tablet 3 mg (has no administration in  time range)  ondansetron (ZOFRAN) tablet 4 mg (has no administration in time range)    Or  ondansetron (ZOFRAN) injection 4 mg (has  no administration in time range)  cefTRIAXone (ROCEPHIN) 1 g in sodium chloride 0.9 % 100 mL IVPB (0 g Intravenous Stopped 06/02/22 2122)    Mobility Unsure     Focused Assessments Cardiac Assessment Handoff:    Lab Results  Component Value Date   CKTOTAL 159 07/10/2021   CKMB 3.0 04/25/2011   TROPONINI <0.03 01/07/2017   Lab Results  Component Value Date   DDIMER 0.48 05/12/2017   Does the Patient currently have chest pain? No    R Recommendations: See Admitting Provider Note  Report given to:   Additional Notes:

## 2022-06-02 NOTE — ED Provider Notes (Signed)
[facility] Provider Note   CSN: [id number] Arrival date & time: 06/02/22  1623     History  Chief Complaint  Patient presents with   Leg Swelling   Fall   Fatigue    Tiffany [name] is a 62 y.o. female.  HPI 62 year old female history of CKD, schizoaffective disorder, PTSD, hypertension, COPD, chronic respiratory failure, type 2 diabetes, GERD, hyperlipidemia presenting for altered mental status and fall.  Per EMS report, patient was at home with her nurse.  She had unwitnessed fall when she states her legs gave out when she stood up.  [name] down for 5 to 10 minutes.  With EMS they were familiar with her and states she was less alert than normal and concern for UTI.  She was hypoxic with EMS requiring 4 L nasal cannula.  Patient states for last few days she has not felt well.  I decreased p.o. intake, decreased urine output.  Denies missing or changing any of her medications.  She felt somewhat short of breath, no headache, neck pain, fever, chills, chest pain, abdominal pain.  No vomiting or diarrhea.  She reports both legs been more swollen than normal.  States she lives alone but regularly has a nurse at home to help.  She reports when she tried to stand up earlier she felt like both of her legs gave out and she went to the ground.  She did not hit her head or lose consciousness.  ER     Home Medications Prior to Admission medications   Medication Sig Start Date End Date Taking? Authorizing Provider  albuterol (VENTOLIN HFA) 108 (90 Base) MCG/ACT inhaler Inhale 2 puffs into the lungs every 6 (six) hours as needed for wheezing or shortness of breath. 01/05/22   Tiffany Kocher, DO  amLODipine (NORVASC) 5 MG tablet Take 1 tablet (5 mg total) by mouth 2 (two) times daily. 03/07/22   Pricilla Riffle, MD  atorvastatin (LIPITOR) 20 MG tablet Take 1 tablet (20 mg total) by mouth daily. 04/03/22   Tiffany Kocher, DO  Capsaicin 0.033 % CREA Apply  a thin layer of cream twice daily to most painful areas of feet and legs. 04/07/22   Tiffany Kocher, DO  carvedilol (COREG) 12.5 MG tablet Take 1 tablet (12.5 mg total) by mouth 2 (two) times daily. 03/07/22   Pricilla Riffle, MD  dicyclomine (BENTYL) 10 MG capsule Take 10 mg by mouth 3 (three) times daily. 04/24/22   [provider]  DULoxetine (CYMBALTA) 30 MG capsule Take 1 capsule (30 mg total) by mouth daily. 04/03/22   Tiffany Kocher, DO  escitalopram (LEXAPRO) 20 MG tablet Take 1 tablet (20 mg total) by mouth daily. 04/03/22   Tiffany Kocher, DO  Fluticasone-Umeclidin-Vilant (TRELEGY ELLIPTA) 100-62.5-25 MCG/ACT AEPB INHALE 1 PUFF INTO THE LUNGS DAILY 03/20/22   Tiffany Kocher, DO  lamoTRIgine (LAMICTAL) 25 MG tablet Take 1 tablet (25 mg total) by mouth 2 (two) times daily. 05/17/22   Tiffany Kocher, DO  loperamide (IMODIUM) 2 MG capsule TAKE ONE CAPSULE BY MOUTH THREE TIMES DAILY AS NEEDED FOR DIARRHEA OR LOOSE STOOLS 05/19/22   Tiffany Kocher, DO  melatonin 3 MG TABS tablet Take 1 tablet (3 mg total) by mouth at bedtime. 04/03/22   Tiffany Kocher, DO  OLANZapine (ZYPREXA) 5 MG tablet Take 1 tablet (5 mg total) by mouth daily. 02/27/22   Moses Manners, MD  omeprazole (PRILOSEC) 40 MG capsule Take 1 capsule (  40 mg total) by mouth every morning. 04/03/22   Tiffany Kocher, DO  Potassium Chloride ER 20 MEQ TBCR Take 1 tablet by mouth daily. 03/13/22   [provider]  prazosin (MINIPRESS) 2 MG capsule TAKE 1 CAPSULE(2 MG) BY MOUTH AT BEDTIME 05/17/22   Tiffany Kocher, DO  pregabalin (LYRICA) 50 MG capsule Take 1 capsule (50 mg total) by mouth 3 (three) times daily. 04/24/22 05/24/22  Tiffany Kocher, DO  SAPHRIS 5 MG SUBL 24 hr tablet Place 5 mg under the tongue 2 (two) times daily. 01/27/22   [provider]  torsemide (DEMADEX) 20 MG tablet Take 1 tablet (20 mg total) by mouth daily. 05/22/22 08/20/22  Pricilla Riffle, MD      Allergies    Aspirin, Hydrocodone, Latuda [lurasidone  hcl], Magnesium-containing compounds, Prednisone, Tramadol, Codeine, Trazodone, Peanut-containing drug products, Topamax [topiramate], Sulfa antibiotics, Tape, and Zetia [ezetimibe]    Review of Systems   Review of Systems  Respiratory:  Positive for shortness of breath.   Cardiovascular:  Positive for leg swelling.  Psychiatric/Behavioral:  Positive for confusion.   All other systems reviewed and are negative.   Physical Exam Updated Vital Signs BP (!) 155/84   Pulse 64   Temp 97.9 F (36.6 C) (Oral)   Resp 19   SpO2 100%  Physical Exam Vitals and nursing note reviewed.  Constitutional:      Appearance: [name] well-developed.     Comments: Somnolent but arouses to voice.  HENT:     Head: Normocephalic and atraumatic.     Nose: Nose normal.     Mouth/Throat:     Mouth: Mucous membranes are dry.     Pharynx: Oropharynx is clear.  Eyes:     Extraocular Movements: Extraocular movements intact.     Conjunctiva/sclera: Conjunctivae normal.     Pupils: Pupils are equal, round, and reactive to light.  Cardiovascular:     Rate and Rhythm: Normal rate and regular rhythm.     Heart sounds: No murmur heard. Pulmonary:     Effort: Pulmonary effort is normal. No respiratory distress.     Breath sounds: Normal breath sounds.  Abdominal:     Palpations: Abdomen is soft.     Tenderness: There is no abdominal tenderness. There is no guarding or rebound.  Musculoskeletal:        General: No swelling.     Cervical back: Normal range of motion and neck supple. No rigidity or tenderness.     Right lower leg: Edema present.     Left lower leg: Edema present.  Skin:    General: Skin is warm and dry.     Capillary Refill: Capillary refill takes less than 2 seconds.  Neurological:     General: No focal deficit present.     Mental Status: [name] oriented to person, place, and time.     Comments: Somnolent and arouses to voice.  Oriented to person, place, time, situation.  [name] able to  move all extremities antigravity without focal deficit.  Normal sensation all extremities.  No cranial nerve deficits.  Psychiatric:        Mood and Affect: Mood normal.     ED Results / Procedures / Treatments   Labs (all labs ordered are listed, but only abnormal results are displayed) Labs Reviewed  CBC WITH DIFFERENTIAL/PLATELET - Abnormal; Notable for the following components:      Result Value   RBC 3.05 (*)    Hemoglobin 8.3 (*)  HCT 27.7 (*)    RDW 17.6 (*)    All other components within normal limits  COMPREHENSIVE METABOLIC PANEL - Abnormal; Notable for the following components:   BUN 48 (*)    Creatinine, Ser 4.73 (*)    Alkaline Phosphatase 137 (*)    GFR, Estimated 10 (*)    All other components within normal limits  URINALYSIS, ROUTINE W REFLEX MICROSCOPIC - Abnormal; Notable for the following components:   Hgb urine dipstick MODERATE (*)    Protein, ur >=300 (*)    Bacteria, UA RARE (*)    All other components within normal limits  BRAIN NATRIURETIC PEPTIDE - Abnormal; Notable for the following components:   B Natriuretic Peptide 363.6 (*)    All other components within normal limits  I-STAT VENOUS BLOOD GAS, ED - Abnormal; Notable for the following components:   pCO2, Ven 38.1 (*)    Acid-base deficit 4.0 (*)    HCT 31.0 (*)    Hemoglobin 10.5 (*)    All other components within normal limits  RESP PANEL BY RT-PCR (RSV, FLU A&B, COVID)  [id number]  URINE CULTURE  MAGNESIUM  AMMONIA  TSH  BASIC METABOLIC PANEL  CBC  BRAIN NATRIURETIC PEPTIDE    EKG EKG Interpretation  Date/Time:  Friday Jun 02 2022 16:36:03 EDT Ventricular Rate:  59 PR Interval:  163 QRS Duration: 88 QT Interval:  459 QTC Calculation: 455 R Axis:   9 Text Interpretation: Sinus rhythm Low voltage, precordial leads Since last tracing rate slower Confirmed by Eber Hong ([id number]) on 06/02/2022 5:40:13 PM  Radiology DG CHEST PORT 1 VIEW  Result Date: 06/02/2022 CLINICAL DATA:   Shortness of breath EXAM: PORTABLE CHEST 1 VIEW COMPARISON:  03/02/2022 FINDINGS: Cardiomegaly with mild central congestion. Possible patchy right mid and lower pulmonary opacities. Multiple old left-sided rib fractures. No pleural effusion or pneumothorax IMPRESSION: 1. Cardiomegaly with mild central congestion. 2. Possible patchy right mid and lower lung airspace disease which may be due to early pneumonia Electronically Signed   By: Jasmine Pang M.D.   On: 06/02/2022 18:16   CT Head Wo Contrast  Result Date: 06/02/2022 CLINICAL DATA:  Head trauma, abnormal mental status, fall EXAM: CT HEAD WITHOUT CONTRAST TECHNIQUE: Contiguous axial images were obtained from the base of the skull through the vertex without intravenous contrast. RADIATION DOSE REDUCTION: This exam was performed according to the departmental dose-optimization program which includes automated exposure control, adjustment of the mA and/or kV according to patient size and/or use of iterative reconstruction technique. COMPARISON:  CT head 07/09/2021 FINDINGS: Brain: No intracranial hemorrhage, mass effect, or evidence of acute infarct. No hydrocephalus. No extra-axial fluid collection. Generalized cerebral atrophy. Ill-defined hypoattenuation within the cerebral white matter is nonspecific but consistent with chronic small vessel ischemic disease. Vascular: No hyperdense vessel. Intracranial arterial calcification. Skull: No fracture or focal lesion. Sinuses/Orbits: No acute finding. Paranasal sinuses and mastoid air cells are well aerated. Other: None. IMPRESSION: 1. No evidence of acute intracranial abnormality. 2. Atrophy and chronic small vessel ischemic disease. Electronically Signed   By: Minerva Fester M.D.   On: 06/02/2022 17:51    Procedures Procedures    Medications Ordered in ED Medications  heparin injection 5,000 Units (has no administration in time range)  melatonin tablet 3 mg (has no administration in time range)   ondansetron (ZOFRAN) tablet 4 mg (has no administration in time range)    Or  ondansetron (ZOFRAN) injection 4 mg (has no administration in time range)  cefTRIAXone (ROCEPHIN) 1 g in sodium chloride 0.9 % 100 mL IVPB (0 g Intravenous Stopped 06/02/22 2122)    ED Course/ Medical Decision Making/ A&P                              Medical Decision Making Amount and/or Complexity of Data Reviewed Labs: ordered. Radiology: ordered.  Risk Decision regarding hospitalization.   62 year old female with history as above presenting for altered mental status, possible fall.  Vital signs reviewed.  Exam as somnolent but arouses to voice and has no focal deficits concerning for CVA.  She did have a fall, no clear signs of trauma.  Obtain CT head for evaluation as well as broad metabolic workup.  Does endorse history of UTI and EMS reported malodorous urine which could be source of her encephalopathy.  No signs of CNS infection.  Lab work reviewed, blood gas without hypercapnia.  CMP notable for continued worsening renal failure and uremia.  BNP is elevated although somewhat improved from prior.  TSH, ammonia are normal.  Urinalysis with some bacteria but not clearly infected.  CBC without leukocytosis.  Chest x-ray reviewed, she does have right-sided hazy opacity concerning for pneumonia and recent shortness of breath.  Given this and her encephalopathy I do think she would benefit from admission to the hospital.  Her CT head is unremarkable.  I discussed the patient with the hospitalist service and she was admitted for further management.  Patient was updated and agreeable with plan for admission.  I did attempt to contact her mother via the phone number provided by the patient but was unable to reach her.        Final Clinical Impression(s) / ED Diagnoses Final diagnoses:  None    Rx / DC Orders ED Discharge Orders     None         Fulton Reek, MD 06/02/22 2133    Eber Hong, MD 06/03/22 [phone]

## 2022-06-02 NOTE — H&P (Addendum)
History and Physical    Patient: Tiffany [name]:[id number] DOB: 08-28-1960 DOA: 06/02/2022 DOS: the patient was seen and examined on 06/02/2022 PCP: Tiffany Kocher, DO  Patient coming from: Home  Chief Complaint:  Chief Complaint  Patient presents with   Leg Swelling   Fall   Fatigue   HPI: Tiffany [name] is a 61 y.o. female with medical history significant for CKD, schizoaffective disorder, and COPD.  The patient tells me she lives alone and that she does not walk well.  She says she uses a motorized scooter but is able to transfer from bed to scooter without problems.  She has a home health nurse and apparently she fell today the home health nurse did not witness the fall but did find her on the floor.  EMS was called and they noted that the patient was lethargic and hypoxic.  She was placed on 4 L O2 nasal cannula and brought brought to the emergency department.  The patient was awake and alert at the time of my evaluation her only complaint is severe swelling in her legs.  She says her legs are tender denies fever, shortness of breath or chest pain. In the emergency department she was no longer requiring any oxygen and was on room air satting well at the time of my evaluation.  She has been taking diuretics because of the swelling in her legs as directed by cardiololgy.  We are asked to admit the patient for the possibility of pneumonia as well as acute renal failure.  Her creatinine now is 4.73.  It was 3.76-month ago and 2.92 months ago.     Review of Systems: As mentioned in the history of present illness. All other systems reviewed and are negative. Past Medical History:  Diagnosis Date   Agitation 11/22/2017   Anoxic brain injury (HCC) 09/08/2016   C. Arrest due to respiratory failure and COPD exacerbation   Anxiety    Arthritis    "all over" (04/10/2016)   Asthma 10/18/2010   Binge eating disorder    Cardiac arrest (HCC) 09/08/2016   PEA   Carotid artery stenosis     1-39% bilateral by dopplers 11/2016   Chronic diastolic (congestive) heart failure (HCC)    Chronic kidney disease, stage 3 (HCC)    Chronic pain syndrome 06/18/2012   Chronic post-traumatic stress disorder (PTSD) 05/27/2018   Chronic respiratory failure with hypoxia and hypercapnia (HCC) 06/22/2015   TRILOGY Vent >AVAPA-ES., Vt target 200-400, Max P 30 , PS max 20 , PS min 6-10 , E Max 6, E Min 4, Rate Auto AVAPS Rate 2 (titrate for pt comfort) , bleed O2 at 5l/m continuous flow .    CKD (chronic kidney disease) 02/03/2022   Closed displaced fracture of fifth metacarpal bone 03/21/2018   Cocaine use disorder, severe, in sustained remission (HCC) 12/17/2015   Complication of anesthesia    decreased bp, decreased heart rate   COPD (chronic obstructive pulmonary disease) (HCC) 07/08/2014   Depression    Diabetic neuropathy (HCC) 04/24/2011   Difficulty with speech 01/24/2018   Disorder of nervous system    Drug abuse (HCC) 11/21/2017   Dyslipidemia 04/24/2011   Elevated troponin 04/28/2012   Emphysema    Encephalopathy 11/21/2017   Essential hypertension 03/22/2016   Fibula fracture 07/10/2016   Frequent falls 10/11/2017   GERD (gastroesophageal reflux disease)    Gout 04/11/2017   Heart attack (HCC) 1980s   History of blood transfusion 1994   "couldn't  stop bleeding from my period"   History of drug abuse in remission (HCC) 11/28/2015   Quit in 2017   Hyperlipidemia LDL goal <70    Incontinence    Manic depression (HCC)    Morbid obesity (HCC) 10/18/2010   Obstructive sleep apnea 10/18/2010   On home oxygen therapy    "6L; 24/7" (04/10/2016)   OSA on CPAP    "wear mask sometimes" (04/10/2016)   Paranoid (HCC)    "sometimes; I'm on RX for it" (04/10/2016)   Prolonged Q-T interval on ECG    Rectal bleeding 12/31/2015   Schizoaffective disorder, bipolar type (HCC) 04/05/2018   Seasonal allergies    Seborrheic keratoses 12/31/2013   Seizures (HCC)    "don't know what kind;  last one was ~ 1 yr ago" (04/10/2016)   Sinus bradycardia    Stroke [facility]) 1980s   denies residual on 04/10/2016   Thrush 09/19/2013   Type 2 diabetes mellitus (HCC) 10/18/2010   Past Surgical History:  Procedure Laterality Date   CESAREAN SECTION  1997   COLONOSCOPY WITH PROPOFOL N/A 04/01/2021   Procedure: COLONOSCOPY WITH PROPOFOL;  Surgeon: Jeani Hawking, MD;  Location: [facility] ENDOSCOPY;  Service: Gastroenterology;  Laterality: N/A;  Rectal bleeding with drop in hemoglobin to 7.2 g/dL   HERNIA REPAIR     IR CHOLANGIOGRAM EXISTING TUBE  07/20/2016   IR PERC CHOLECYSTOSTOMY  05/10/2016   IR RADIOLOGIST EVAL & MGMT  06/08/2016   IR RADIOLOGIST EVAL & MGMT  06/29/2016   IR SINUS/FIST TUBE CHK-NON GI  07/12/2016   RIGHT/LEFT HEART CATH AND CORONARY ANGIOGRAPHY N/A 06/19/2017   Procedure: RIGHT/LEFT HEART CATH AND CORONARY ANGIOGRAPHY;  Surgeon: Dolores Patty, MD;  Location: [facility] INVASIVE CV LAB;  Service: Cardiovascular;  Laterality: N/A;   TIBIA IM NAIL INSERTION Right 07/12/2016   Procedure: INTRAMEDULLARY (IM) NAIL RIGHT TIBIA;  Surgeon: Tarry Kos, MD;  Location: [facility] OR;  Service: Orthopedics;  Laterality: Right;   UMBILICAL HERNIA REPAIR  ~ 1963   "that's why I don't have a belly button"   VAGINAL HYSTERECTOMY     Social History:  reports that she quit smoking about 6 years ago. Her smoking use included cigarettes. She started smoking about 45 years ago. She has a 57.00 pack-year smoking history. She has been exposed to tobacco smoke. She has never used smokeless tobacco. She reports that she does not currently use drugs after having used the following drugs: Cocaine. She reports that she does not drink alcohol.  Allergies  Allergen Reactions   Aspirin     GI bleed x2   Hydrocodone Shortness Of Breath   Latuda [Lurasidone Hcl] Anaphylaxis   Magnesium-Containing Compounds Anaphylaxis and Other (See Comments)    Tolerated Ensure   Prednisone Anaphylaxis, Swelling and Other (See Comments)     Tongue swelling, lip swelling, throat swelling, per pt    Tramadol Anaphylaxis, Swelling and Rash   Codeine Nausea And Vomiting and Rash   Trazodone Other (See Comments)    paranoia   Peanut-Containing Drug Products Hives    Raw peanuts, but can eat peanut butter   Topamax [Topiramate] Other (See Comments)    Increases paranoia   Sulfa Antibiotics Itching   Tape Rash   Zetia [Ezetimibe] Itching and Rash    Family History  Problem Relation Age of Onset   Cancer Mother        lung   Depression Mother    Cancer Father  prostate   Depression Sister    Anxiety disorder Sister    Schizophrenia Sister    Bipolar disorder Sister    Depression Sister    Depression Brother    Heart failure Other        cousin    Prior to Admission medications   Medication Sig Start Date End Date Taking? Authorizing Provider  albuterol (VENTOLIN HFA) 108 (90 Base) MCG/ACT inhaler Inhale 2 puffs into the lungs every 6 (six) hours as needed for wheezing or shortness of breath. 01/05/22   Tiffany Kocher, DO  amLODipine (NORVASC) 5 MG tablet Take 1 tablet (5 mg total) by mouth 2 (two) times daily. 03/07/22   Pricilla Riffle, MD  atorvastatin (LIPITOR) 20 MG tablet Take 1 tablet (20 mg total) by mouth daily. 04/03/22   Tiffany Kocher, DO  Capsaicin 0.033 % CREA Apply a thin layer of cream twice daily to most painful areas of feet and legs. 04/07/22   Tiffany Kocher, DO  carvedilol (COREG) 12.5 MG tablet Take 1 tablet (12.5 mg total) by mouth 2 (two) times daily. 03/07/22   Pricilla Riffle, MD  dicyclomine (BENTYL) 10 MG capsule Take 10 mg by mouth 3 (three) times daily. 04/24/22   [provider]  DULoxetine (CYMBALTA) 30 MG capsule Take 1 capsule (30 mg total) by mouth daily. 04/03/22   Tiffany Kocher, DO  escitalopram (LEXAPRO) 20 MG tablet Take 1 tablet (20 mg total) by mouth daily. 04/03/22   Tiffany Kocher, DO  Fluticasone-Umeclidin-Vilant (TRELEGY ELLIPTA) 100-62.5-25 MCG/ACT AEPB INHALE 1  PUFF INTO THE LUNGS DAILY 03/20/22   Tiffany Kocher, DO  lamoTRIgine (LAMICTAL) 25 MG tablet Take 1 tablet (25 mg total) by mouth 2 (two) times daily. 05/17/22   Tiffany Kocher, DO  loperamide (IMODIUM) 2 MG capsule TAKE ONE CAPSULE BY MOUTH THREE TIMES DAILY AS NEEDED FOR DIARRHEA OR LOOSE STOOLS 05/19/22   Tiffany Kocher, DO  melatonin 3 MG TABS tablet Take 1 tablet (3 mg total) by mouth at bedtime. 04/03/22   Tiffany Kocher, DO  OLANZapine (ZYPREXA) 5 MG tablet Take 1 tablet (5 mg total) by mouth daily. 02/27/22   Moses Manners, MD  omeprazole (PRILOSEC) 40 MG capsule Take 1 capsule (40 mg total) by mouth every morning. 04/03/22   Tiffany Kocher, DO  Potassium Chloride ER 20 MEQ TBCR Take 1 tablet by mouth daily. 03/13/22   [provider]  prazosin (MINIPRESS) 2 MG capsule TAKE 1 CAPSULE(2 MG) BY MOUTH AT BEDTIME 05/17/22   Tiffany Kocher, DO  pregabalin (LYRICA) 50 MG capsule Take 1 capsule (50 mg total) by mouth 3 (three) times daily. 04/24/22 05/24/22  Tiffany Kocher, DO  SAPHRIS 5 MG SUBL 24 hr tablet Place 5 mg under the tongue 2 (two) times daily. 01/27/22   [provider]  torsemide (DEMADEX) 20 MG tablet Take 1 tablet (20 mg total) by mouth daily. 05/22/22 08/20/22  Pricilla Riffle, MD    Physical Exam: Vitals:   06/02/22 1640 06/02/22 1715 06/02/22 1804 06/02/22 1830  BP: 136/79 (!) 134/96 (!) 141/85 (!) 155/84  Pulse: (!) 58 61 64 64  Resp: 17 20 (!) 22 19  Temp: 97.9 F (36.6 C)     TempSrc: Oral     SpO2: 100% 100% 98% 100%   Physical Exam:  General: No acute distress, well developed, well nourished HEENT: Normocephalic, atraumatic, PERRL,cataracts, no teeth Cardiovascular: Normal rate and rhythm. Systolic murmur, Distal pulses intact. Pulmonary: Normal pulmonary effort, normal breath  sounds Gastrointestinal: Nondistended abdomen, soft, non-tender, normoactive bowel sounds, no organomegaly Musculoskeletal: 3+ lower ext edema, legs are tender to  palpation Lymphadenopathy: No cervical LAD. Skin: Skin is warm and dry. Neuro: No focal deficits noted, AAOx3. PSYCH: Attentive and cooperative   Data Reviewed:  Results for orders placed or performed during the hospital encounter of 06/02/22 (from the past 24 hour(s))  Brain natriuretic peptide     Status: Abnormal   Collection Time: 06/02/22  4:53 PM  Result Value Ref Range   B Natriuretic Peptide 363.6 (H) 0.0 - 100.0 pg/mL  TSH     Status: None   Collection Time: 06/02/22  4:53 PM  Result Value Ref Range   TSH 1.126 0.350 - 4.500 uIU/mL  CBC with Differential     Status: Abnormal   Collection Time: 06/02/22  5:00 PM  Result Value Ref Range   WBC 4.6 4.0 - 10.5 K/uL   RBC 3.05 (L) 3.87 - 5.11 MIL/uL   Hemoglobin 8.3 (L) 12.0 - 15.0 g/dL   HCT 60.7 (L) 37.1 - 06.2 %   MCV 90.8 80.0 - 100.0 fL   MCH 27.2 26.0 - 34.0 pg   MCHC 30.0 30.0 - 36.0 g/dL   RDW 69.4 (H) 85.4 - 62.7 %   Platelets 204 150 - 400 K/uL   nRBC 0.0 0.0 - 0.2 %   Neutrophils Relative % 68 %   Neutro Abs 3.1 1.7 - 7.7 K/uL   Lymphocytes Relative 19 %   Lymphs Abs 0.9 0.7 - 4.0 K/uL   Monocytes Relative 11 %   Monocytes Absolute 0.5 0.1 - 1.0 K/uL   Eosinophils Relative 2 %   Eosinophils Absolute 0.1 0.0 - 0.5 K/uL   Basophils Relative 0 %   Basophils Absolute 0.0 0.0 - 0.1 K/uL   Immature Granulocytes 0 %   Abs Immature Granulocytes 0.02 0.00 - 0.07 K/uL  Comprehensive metabolic panel     Status: Abnormal   Collection Time: 06/02/22  5:00 PM  Result Value Ref Range   Sodium 139 135 - 145 mmol/L   Potassium 4.9 3.5 - 5.1 mmol/L   Chloride 107 98 - 111 mmol/L   CO2 22 22 - 32 mmol/L   Glucose, Bld 83 70 - 99 mg/dL   BUN 48 (H) 8 - 23 mg/dL   Creatinine, Ser 0.35 (H) 0.44 - 1.00 mg/dL   Calcium 8.9 8.9 - 00.9 mg/dL   Total Protein 6.6 6.5 - 8.1 g/dL   Albumin 3.5 3.5 - 5.0 g/dL   AST 18 15 - 41 U/L   ALT 9 0 - 44 U/L   Alkaline Phosphatase 137 (H) 38 - 126 U/L   Total Bilirubin 0.4 0.3 - 1.2  mg/dL   GFR, Estimated 10 (L) >60 mL/min   Anion gap 10 5 - 15  Magnesium     Status: None   Collection Time: 06/02/22  5:00 PM  Result Value Ref Range   Magnesium 2.3 1.7 - 2.4 mg/dL  Ammonia     Status: None   Collection Time: 06/02/22  5:00 PM  Result Value Ref Range   Ammonia <10 9 - 35 umol/L  Resp panel by RT-PCR (RSV, Flu A&B, Covid) Anterior Nasal Swab     Status: None   Collection Time: 06/02/22  5:17 PM   Specimen: Anterior Nasal Swab  Result Value Ref Range   SARS Coronavirus 2 by RT PCR NEGATIVE NEGATIVE   Influenza A by PCR NEGATIVE NEGATIVE  Influenza B by PCR NEGATIVE NEGATIVE   Resp Syncytial Virus by PCR NEGATIVE NEGATIVE  Urinalysis, Routine w reflex microscopic -Urine, Catheterized     Status: Abnormal   Collection Time: 06/02/22  5:20 PM  Result Value Ref Range   Color, Urine YELLOW YELLOW   APPearance CLEAR CLEAR   Specific Gravity, Urine 1.009 1.005 - 1.030   pH 5.0 5.0 - 8.0   Glucose, UA NEGATIVE NEGATIVE mg/dL   Hgb urine dipstick MODERATE (A) NEGATIVE   Bilirubin Urine NEGATIVE NEGATIVE   Ketones, ur NEGATIVE NEGATIVE mg/dL   Protein, ur >=098 (A) NEGATIVE mg/dL   Nitrite NEGATIVE NEGATIVE   Leukocytes,Ua NEGATIVE NEGATIVE   RBC / HPF 11-20 0 - 5 RBC/hpf   WBC, UA 0-5 0 - 5 WBC/hpf   Bacteria, UA RARE (A) NONE SEEN   Squamous Epithelial / HPF 0-5 0 - 5 /HPF  I-Stat venous blood gas, ([facility] ED, [facility], [facility])     Status: Abnormal   Collection Time: 06/02/22  6:43 PM  Result Value Ref Range   pH, Ven 7.356 7.25 - 7.43   pCO2, Ven 38.1 (L) 44 - 60 mmHg   pO2, Ven 34 32 - 45 mmHg   Bicarbonate 21.3 20.0 - 28.0 mmol/L   TCO2 22 22 - 32 mmol/L   O2 Saturation 62 %   Acid-base deficit 4.0 (H) 0.0 - 2.0 mmol/L   Sodium 141 135 - 145 mmol/L   Potassium 5.0 3.5 - 5.1 mmol/L   Calcium, Ion 1.16 1.15 - 1.40 mmol/L   HCT 31.0 (L) 36.0 - 46.0 %   Hemoglobin 10.5 (L) 12.0 - 15.0 g/dL   Sample type VENOUS    Comment NOTIFIED PHYSICIAN    *Note: Due to a large  number of results and/or encounters for the requested time period, some results have not been displayed. A complete set of results can be found in Results Review.        ECHOCARDIOGRAM REPORT        01/2022 ECHO  1. Left ventricular ejection fraction, by estimation, is 60 to 65%. The left ventricle has normal function. The left ventricle has no regional wall motion abnormalities. The left ventricular internal cavity size was mildly dilated. There is mild left ventricular hypertrophy. Left ventricular diastolic parameters are consistent with Grade II diastolic dysfunction (pseudonormalization). Elevated left atrial pressure.  2. Right ventricular systolic function is normal. The right ventricular size is normal. There is mildly elevated pulmonary artery systolic pressure. The estimated right ventricular systolic pressure is 39.1 mmHg.  3. Left atrial size was severely dilated.  4. Right atrial size was mildly dilated.  5. The mitral valve is normal in structure. Trivial mitral valve regurgitation. No evidence of mitral stenosis.  6. The aortic valve is tricuspid. Aortic valve regurgitation is not visualized. Aortic valve sclerosis is present, with no evidence of aortic valve stenosis.  7. The inferior vena cava is dilated in size with >50% respiratory variability, suggesting right atrial pressure of 8 mmHg.   CXR IMPRESSION: 1. Cardiomegaly with mild central congestion. 2. Possible patchy right mid and lower lung airspace disease which may be due to early pneumonia   Assessment and Plan:  # Acute on chronic renal failure.  Creatinine is now 4.7 - Nephrology consultation  - Hold diuretics  # Possible pneumonia vs pulmonary edema - CXR was read as possible early pneumonia but patient has a normal white count and no fever.  Her only complaint is all the  fluid in her legs. -Will continue with Rocephin and Zithromax empirically, but I suspect this is fluid related.  #  Encephalopathy- [name] alert, attentive, and cooperative at this moment.   Advance Care Planning:   Code Status: Prior the patient would like to be full code and names her daughter, Ricarda Frame as her Social research officer, government.  Consults: Will need nephrology consult  Family Communication: none  Severity of Illness: The appropriate patient status for this patient is INPATIENT. Inpatient status is judged to be reasonable and necessary in order to provide the required intensity of service to ensure the patient's safety. The patient's presenting symptoms, physical exam findings, and initial radiographic and laboratory data in the context of their chronic comorbidities is felt to place them at high risk for further clinical deterioration. Furthermore, it is not anticipated that the patient will be medically stable for discharge from the hospital within 2 midnights of admission.   * I certify that at the point of admission it is my clinical judgment that the patient will require inpatient hospital care spanning beyond 2 midnights from the point of admission due to high intensity of service, high risk for further deterioration and high frequency of surveillance required.*  Author: [name] Irish, MD 06/02/2022 8:10 PM  For on call review [email or web address].

## 2022-06-02 NOTE — ED Triage Notes (Signed)
Pt BIBA from home. Pt stated her legs came out from under her, causing her to fall. Pt stated she was down for about 5-10 mins. EMS crew that came out stated she was more lethargic than normal, concerned for UTI. Pt has pitting edema, hx of CHF. Pt's sats were 86% on RA on EMS arrival.

## 2022-06-03 ENCOUNTER — Other Ambulatory Visit: Payer: Self-pay | Admitting: Student

## 2022-06-03 ENCOUNTER — Inpatient Hospital Stay (HOSPITAL_COMMUNITY): Payer: 59

## 2022-06-03 DIAGNOSIS — N184 Chronic kidney disease, stage 4 (severe): Secondary | ICD-10-CM | POA: Diagnosis not present

## 2022-06-03 DIAGNOSIS — N179 Acute kidney failure, unspecified: Secondary | ICD-10-CM | POA: Diagnosis not present

## 2022-06-03 LAB — CBC
HCT: 25.8 % — ABNORMAL LOW (ref 36.0–46.0)
Hemoglobin: 7.8 g/dL — ABNORMAL LOW (ref 12.0–15.0)
MCH: 26.8 pg (ref 26.0–34.0)
MCHC: 30.2 g/dL (ref 30.0–36.0)
MCV: 88.7 fL (ref 80.0–100.0)
Platelets: 209 10*3/uL (ref 150–400)
RBC: 2.91 MIL/uL — ABNORMAL LOW (ref 3.87–5.11)
RDW: 17.5 % — ABNORMAL HIGH (ref 11.5–15.5)
WBC: 3.9 10*3/uL — ABNORMAL LOW (ref 4.0–10.5)
nRBC: 0 % (ref 0.0–0.2)

## 2022-06-03 LAB — URINE CULTURE: Culture: NO GROWTH

## 2022-06-03 LAB — BASIC METABOLIC PANEL
Anion gap: 9 (ref 5–15)
BUN: 48 mg/dL — ABNORMAL HIGH (ref 8–23)
CO2: 21 mmol/L — ABNORMAL LOW (ref 22–32)
Calcium: 8.3 mg/dL — ABNORMAL LOW (ref 8.9–10.3)
Chloride: 110 mmol/L (ref 98–111)
Creatinine, Ser: 4.51 mg/dL — ABNORMAL HIGH (ref 0.44–1.00)
GFR, Estimated: 10 mL/min — ABNORMAL LOW (ref >60)
Glucose, Bld: 93 mg/dL (ref 70–99)
Potassium: 4.2 mmol/L (ref 3.5–5.1)
Sodium: 140 mmol/L (ref 135–145)

## 2022-06-03 LAB — PROCALCITONIN: Procalcitonin: <0.1 ng/mL

## 2022-06-03 LAB — BRAIN NATRIURETIC PEPTIDE: B Natriuretic Peptide: 303.1 pg/mL — ABNORMAL HIGH (ref 0.0–100.0)

## 2022-06-03 MED ORDER — SODIUM CHLORIDE 0.9 % IV SOLN: INTRAVENOUS | Status: DC

## 2022-06-03 NOTE — Progress Notes (Signed)
New Admission Note:    Arrival Method: ED Stretcher Mental Orientation: A/Ox4 Telemetry: box 16 Assessment: completed Skin: intact IV: 18 rt arm, left forearm Pain: n/a Tubes: n/a Safety Measures: bed alarm Admission: completed Orientation: Patient has been oriented to the room, unit and staff.  Family:  Belongings: clothing,keys  Orders have been reviewed and implemented. Will continue to monitor the patient. Call light has been placed within reach and bed alarm has been activated.   Fabian Sharp BSN, RN-BC Phone number: [phone]

## 2022-06-03 NOTE — Plan of Care (Signed)

## 2022-06-03 NOTE — Progress Notes (Signed)
PROGRESS NOTE  Tiffany KALMBACH  DOB: [birth year]  PCP: Tiffany Kocher, DO NWG:[id number]  DOA: 06/02/2022  LOS: 1 day  Hospital Day: 2  Brief narrative: Tiffany [name] is a 62 y.o. female with PMH significant for obesity, OSA, DM2, HTN, HLD, CAD, CHF, bilateral lower extremity edema on diuretics, CKD, carotid artery disease, h/o cardiac arrest and subsequent anoxic brain injury, history of drug abuse, COPD, anxiety/depression, schizoaffective disorder, GERD.  Patient lives alone, has impaired mobility, uses a motorized scooter  5/17, patient lost her balance, fell to the floor, was not able to get up and was later found on the floor by home health nurse.  EMS noted that she was lethargic and hypoxic, started on 4 L oxygen and brought to the ED.  In the ED, patient was afebrile, heart rate 58, blood pressure 130s, breathing on room air She was noted to have severe swelling and tenderness in both her legs. Labs showed hemoglobin low at 8.3, creatinine elevated 4.73. Urinalysis with clear yellow urine, moderate hemoglobin, negative leukocytes, rare bacteria CT head did not show any evidence of acute intracranial malady.  It showed atrophy and chronic small vessel ischemic disease.   Chest x-ray showed cardiomegaly with mild central congestion, possible patchy right mid and lower lung airspace disease Admitted to [facility]  Subjective: Patient was seen and examined this morning. Elderly African-American female.  Lying down in bed.  Was very drowsy for me..  Able to open eyes and mumble.  Per RN, she was able to take her pills earlier.  Earlier, she received morning dose of mood altering medications including Sapharis Remains hemodynamically stable Labs this morning with creatinine improving to 4.51, hemoglobin down to 7.8  Assessment and plan: AKI on CKD 4 Baseline creatinine seems to be less than 3 until February 2024 after which she seems to have progressive worsening renal function.  Most  recent creatinine was 4.19 two weeks ago.   Presented with creatinine elevated to 4.73. Per history, she was kept on diuretics for chronic lower extremity edema. Diuretics on hold Most recent renal ultrasound from January 2024 with chronic medical renal disease and no obstruction. Poor oral intake.  Start normal saline at 75 mill per hour Recent Labs    03/01/22 0029 03/02/22 0107 03/03/22 0532 03/04/22 0605 03/05/22 0339 03/06/22 0524 05/05/22 1315 05/19/22 1408 06/02/22 1700 06/03/22 0220  BUN 65* 64* 59* 56* 61* 64* 50* 49* 48* 48*  CREATININE 3.08* 2.95* 2.74* 3.27* 2.90* 2.73* 3.65* 4.19* 4.73* 4.51*   Acute metabolic encephalopathy Schizoaffective disorder Anxiety/depression Patient is very somnolent this morning.  Suspect due to uremia as well as the effect of morning psych meds.   PTA on multiple mood altering medications including Cymbalta 30 mg daily, Lexapro 20 mg daily, Lamictal 25 mg twice daily, Zyprexa 5 mg at bedtime, Lyrica 50 mg 3 times daily, Saphris 5 mg twice daily. I will continue Lamictal and Zyprexa but keep others on hold. Continue to monitor mental status change.  Chronic anemia Hemoglobin at baseline between 8 and 9.  Likely has anemia of chronic disease.  No active bleeding.  Continue to monitor. Recent Labs    03/06/22 0524 05/05/22 1315 06/02/22 1700 06/02/22 1843 06/03/22 0220  HGB 8.7* 8.9* 8.3* 10.5* 7.8*  MCV 90.2 89 90.8  --  88.7   Abnormal chest x-ray COPD Chest x-ray showed mild congestion, patchy right sided airspace disease. No fever.  WBC count not elevated.  Procalcitonin level low. Given empiric IV  Rocephin in the ED.  I would monitor off antibiotics for now. Continue bronchodilators Not requiring supplemental oxygen Recent Labs  Lab 06/02/22 1700 06/03/22 0220  WBC 4.6 3.9*  PROCALCITON  --  <0.10   CHF/HTN Bilateral lower extremity edema PTA on carvedilol 12.5 mg twice daily, amlodipine 5 mg twice daily, prazosin 2  mg at bedtime, torsemide 20 mg daily. Continue Coreg, amlodipine, prazosin. Torsemide held Bilateral TED hoses may help.  CAD, h/o cardiac arrest Coronary artery disease HLD PTA on Coreg, Lipitor, I do not see any antiplatelet or anticoagulant in the list Continue Coreg and Lipitor  Type 2 diabetes mellitus A1c 5 in 2023 Not on antidiabetic meds  Morbid Obesity  Body mass index is 35.95 kg/m. Patient has been advised to make an attempt to improve diet and exercise patterns to aid in weight loss.  OSA   Fall at home Reportedly secondary to losing her balance.  Was unable to get up Pelvic x-ray without hip fracture PT eval requested   Mobility: PT eval ordered  Goals of care   Code Status: Full Code     DVT prophylaxis:  heparin injection 5,000 Units Start: 06/02/22 2200   Antimicrobials: None currently Fluid: NS at 75 mill per hour Consultants: None Family Communication: None at bedside  Status: Inpatient Level of care:  Telemetry Medical   Patient from: Home Anticipated d/c to: Pending clinical course Needs to continue in-hospital care:  Continue to monitor mental status change, renal function monitoring    Diet:  Diet Order             Diet renal with fluid restriction Fluid restriction: 1200 mL Fluid; Room service appropriate? Yes; Fluid consistency: Thin  Diet effective now                   Scheduled Meds:  amLODipine  5 mg Oral BID   atorvastatin  20 mg Oral Daily   carvedilol  12.5 mg Oral BID WC   heparin  5,000 Units Subcutaneous Q8H   lamoTRIgine  25 mg Oral BID   melatonin  3 mg Oral QHS   OLANZapine  5 mg Oral QHS   prazosin  2 mg Oral QHS    PRN meds: albuterol, melatonin, ondansetron **OR** ondansetron (ZOFRAN) IV   Infusions:   sodium chloride      Antimicrobials: Anti-infectives (From admission, onward)    Start     Dose/Rate Route Frequency Ordered Stop   06/02/22 1900  cefTRIAXone (ROCEPHIN) 1 g in sodium  chloride 0.9 % 100 mL IVPB        1 g 200 mL/hr over 30 Minutes Intravenous  Once 06/02/22 1852 06/02/22 2122       Nutritional status:  Body mass index is 35.95 kg/m.          Objective: Vitals:   06/03/22 0429 06/03/22 0958  BP: 133/74 134/72  Pulse: (!) 58 62  Resp: 18 18  Temp: 98.1 F (36.7 C) 98.2 F (36.8 C)  SpO2: 92% 94%    Intake/Output Summary (Last 24 hours) at 06/03/2022 1109 Last data filed at 06/03/2022 1610 Gross per 24 hour  Intake 580 ml  Output 650 ml  Net -70 ml   Filed Weights   06/02/22 2300  Weight: 95 kg   Weight change:  Body mass index is 35.95 kg/m.   Physical Exam: General exam: Elderly African-American female.  Somnolent. Skin: No rashes, lesions or ulcers. HEENT: Atraumatic, normocephalic, no obvious bleeding  Lungs: Clear to auscultation bilaterally CVS: Regular rate and rhythm, no murmurs GI/Abd soft, nontender, nondistended, bowel sound present CNS: Somnolent, barely able to open eyes and mumbles.  Per RN was awake earlier before meds Psychiatry: Unable to examine because of altered mentation Extremities: 1+ bilateral pedal edema, no calf tenderness  Data Review: I have personally reviewed the laboratory data and studies available.  F/u labs ordered Unresulted Labs (From admission, onward)     Start     Ordered   06/02/22 1650  Urine Culture  Once,   URGENT       Question:  Indication  Answer:  Dysuria   06/02/22 1652            Total time spent in review of labs and imaging, patient evaluation, formulation of plan, documentation and communication with family: 55 minutes  Signed, Lorin Glass, MD [facility] 06/03/2022

## 2022-06-04 ENCOUNTER — Inpatient Hospital Stay (HOSPITAL_COMMUNITY): Payer: 59

## 2022-06-04 DIAGNOSIS — N179 Acute kidney failure, unspecified: Secondary | ICD-10-CM | POA: Diagnosis not present

## 2022-06-04 DIAGNOSIS — M7989 Other specified soft tissue disorders: Secondary | ICD-10-CM

## 2022-06-04 DIAGNOSIS — N184 Chronic kidney disease, stage 4 (severe): Secondary | ICD-10-CM | POA: Diagnosis not present

## 2022-06-04 LAB — BASIC METABOLIC PANEL
Anion gap: 7 (ref 5–15)
BUN: 54 mg/dL — ABNORMAL HIGH (ref 8–23)
CO2: 21 mmol/L — ABNORMAL LOW (ref 22–32)
Calcium: 8.1 mg/dL — ABNORMAL LOW (ref 8.9–10.3)
Chloride: 112 mmol/L — ABNORMAL HIGH (ref 98–111)
Creatinine, Ser: 4.55 mg/dL — ABNORMAL HIGH (ref 0.44–1.00)
GFR, Estimated: 10 mL/min — ABNORMAL LOW (ref >60)
Glucose, Bld: 90 mg/dL (ref 70–99)
Potassium: 4.7 mmol/L (ref 3.5–5.1)
Sodium: 140 mmol/L (ref 135–145)

## 2022-06-04 LAB — IRON AND TIBC
Iron: 61 ug/dL (ref 28–170)
Saturation Ratios: 21 % (ref 10.4–31.8)
TIBC: 287 ug/dL (ref 250–450)
UIBC: 226 ug/dL

## 2022-06-04 LAB — CBC WITH DIFFERENTIAL/PLATELET
Abs Immature Granulocytes: 0.01 10*3/uL (ref 0.00–0.07)
Basophils Absolute: 0 10*3/uL (ref 0.0–0.1)
Basophils Relative: 0 %
Eosinophils Absolute: 0.1 10*3/uL (ref 0.0–0.5)
Eosinophils Relative: 3 %
HCT: 26.2 % — ABNORMAL LOW (ref 36.0–46.0)
Hemoglobin: 8 g/dL — ABNORMAL LOW (ref 12.0–15.0)
Immature Granulocytes: 0 %
Lymphocytes Relative: 33 %
Lymphs Abs: 1.1 10*3/uL (ref 0.7–4.0)
MCH: 27.7 pg (ref 26.0–34.0)
MCHC: 30.5 g/dL (ref 30.0–36.0)
MCV: 90.7 fL (ref 80.0–100.0)
Monocytes Absolute: 0.4 10*3/uL (ref 0.1–1.0)
Monocytes Relative: 13 %
Neutro Abs: 1.7 10*3/uL (ref 1.7–7.7)
Neutrophils Relative %: 51 %
Platelets: 206 10*3/uL (ref 150–400)
RBC: 2.89 MIL/uL — ABNORMAL LOW (ref 3.87–5.11)
RDW: 17.5 % — ABNORMAL HIGH (ref 11.5–15.5)
WBC: 3.3 10*3/uL — ABNORMAL LOW (ref 4.0–10.5)
nRBC: 0 % (ref 0.0–0.2)

## 2022-06-04 LAB — RETICULOCYTES
Immature Retic Fract: 7 % (ref 2.3–15.9)
RBC.: 2.93 MIL/uL — ABNORMAL LOW (ref 3.87–5.11)
Retic Count, Absolute: 51 10*3/uL (ref 19.0–186.0)
Retic Ct Pct: 1.7 % (ref 0.4–3.1)

## 2022-06-04 LAB — FOLATE: Folate: 4.1 ng/mL — ABNORMAL LOW (ref >5.9)

## 2022-06-04 LAB — VITAMIN B12: Vitamin B-12: 301 pg/mL (ref 180–914)

## 2022-06-04 LAB — FERRITIN: Ferritin: 30 ng/mL (ref 11–307)

## 2022-06-04 MED ORDER — LAMOTRIGINE 100 MG PO TABS
100.0000 mg | ORAL_TABLET | Freq: Two times a day (BID) | ORAL | Status: DC
  Administered 2022-06-04 – 2022-06-08 (×8): 100 mg via ORAL
  Filled 2022-06-04 (×8): qty 1

## 2022-06-04 MED ORDER — VITAMIN B-12 1000 MCG PO TABS
1000.0000 ug | ORAL_TABLET | Freq: Every day | ORAL | Status: DC
  Administered 2022-06-04 – 2022-06-08 (×5): 1000 ug via ORAL
  Filled 2022-06-04 (×5): qty 1

## 2022-06-04 MED ORDER — FOLIC ACID 1 MG PO TABS
1.0000 mg | ORAL_TABLET | Freq: Every day | ORAL | Status: DC
  Administered 2022-06-04 – 2022-06-08 (×5): 1 mg via ORAL
  Filled 2022-06-04 (×5): qty 1

## 2022-06-04 MED ORDER — PREGABALIN 25 MG PO CAPS
50.0000 mg | ORAL_CAPSULE | Freq: Three times a day (TID) | ORAL | Status: DC
  Administered 2022-06-04 – 2022-06-08 (×13): 50 mg via ORAL
  Filled 2022-06-04 (×13): qty 2

## 2022-06-04 MED ORDER — CYCLOBENZAPRINE HCL 10 MG PO TABS
10.0000 mg | ORAL_TABLET | Freq: Three times a day (TID) | ORAL | Status: DC | PRN
  Administered 2022-06-04 – 2022-06-07 (×4): 10 mg via ORAL
  Filled 2022-06-04 (×4): qty 1

## 2022-06-04 MED ORDER — OXYCODONE-ACETAMINOPHEN 5-325 MG PO TABS
1.0000 | ORAL_TABLET | Freq: Four times a day (QID) | ORAL | Status: DC | PRN
  Administered 2022-06-04 – 2022-06-08 (×5): 1 via ORAL
  Filled 2022-06-04 (×5): qty 1

## 2022-06-04 NOTE — Progress Notes (Signed)
PROGRESS NOTE  Tiffany [name]  DOB: 10-14-1960  PCP: Tiffany Kocher, DO ION:[id number]  DOA: 06/02/2022  LOS: 2 days  Hospital Day: 3  Brief narrative: Tiffany [name] is a 62 y.o. female with PMH significant for obesity, OSA, DM2, HTN, HLD, CAD, CHF, bilateral lower extremity edema on diuretics, CKD, carotid artery disease, h/o cardiac arrest and subsequent anoxic brain injury, history of drug abuse, COPD, anxiety/depression, schizoaffective disorder, GERD.  Patient lives alone, has impaired mobility, uses a motorized scooter  5/17, patient lost her balance, fell to the floor, was not able to get up and was later found on the floor by home health nurse.  EMS noted that she was lethargic and hypoxic, started on 4 L oxygen and brought to the ED.  In the ED, patient was afebrile, heart rate 58, blood pressure 130s, breathing on room air She was noted to have severe swelling and tenderness in both her legs. Labs showed hemoglobin low at 8.3, creatinine elevated 4.73. Urinalysis with clear yellow urine, moderate hemoglobin, negative leukocytes, rare bacteria CT head did not show any evidence of acute intracranial malady.  It showed atrophy and chronic small vessel ischemic disease.   Chest x-ray showed cardiomegaly with mild central congestion, possible patchy right mid and lower lung airspace disease Admitted to [facility]  Subjective: Patient was seen and examined this morning. Propped up in bed.  More awake alert and able to have a conversation today.  Complains of neuropathy pain pregabalin. Labs this morning with creatinine elevated to 4.55  Assessment and plan: AKI on CKD 4 Proteinuria Baseline creatinine seems to be less than 3 until February 2024 after which she seems to have progressive worsening renal function.  Most recent creatinine was 4.19 two weeks ago.  On diuretics at home for chronic lymphedema. Presented with creatinine elevated to 4.73.  Urine protein more than 300 Most  recent renal ultrasound from January 2024 with chronic medical renal disease and no obstruction. Currently diuretics on hold.  On IV fluid.  In the last 24 hours no significant change in creatinine. Recent Labs    03/02/22 0107 03/03/22 0532 03/04/22 0605 03/05/22 0339 03/06/22 0524 05/05/22 1315 05/19/22 1408 06/02/22 1700 06/03/22 0220 06/04/22 0206  BUN 64* 59* 56* 61* 64* 50* 49* 48* 48* 54*  CREATININE 2.95* 2.74* 3.27* 2.90* 2.73* 3.65* 4.19* 4.73* 4.51* 4.55*   Acute metabolic encephalopathy Schizoaffective disorder Anxiety/depression Patient is very somnolent this morning.  Suspect due to uremia as well as the effect of morning psych meds.   PTA on multiple mood altering medications including Cymbalta 30 mg daily, Lexapro 20 mg daily, Lamictal 25 mg twice daily, Zyprexa 5 mg at bedtime, Lyrica 100 mg 3 times daily, Flexeril 10 mg 3 times daily as needed, Saphris 5 mg twice daily. Currently continued on Lamictal and Zyprexa but others are on hold.  Patient is complaining of burning sensation of both feet.  I will resume Lyrica at a lower dose of 50 mg 3 times daily.  Also using Flexeril as needed. More awake and conversational today.  Continue to monitor mental status.  Chronic anemia Hemoglobin at baseline between 8 and 9.  Likely has anemia of chronic disease.  No active bleeding.  Continue to monitor. Recent Labs    05/05/22 1315 06/02/22 1700 06/02/22 1843 06/03/22 0220 06/04/22 0206  HGB 8.9* 8.3* 10.5* 7.8* 8.0*  MCV 89 90.8  --  88.7 90.7  VITAMINB12  --   --   --   --  301  FOLATE  --   --   --   --  4.1*  FERRITIN  --   --   --   --  30  TIBC  --   --   --   --  287  IRON  --   --   --   --  61  RETICCTPCT  --   --   --   --  1.7   Abnormal chest x-ray COPD Chest x-ray showed mild congestion, patchy right sided airspace disease. No fever.  WBC count not elevated.  Procalcitonin level low. Given empiric IV Rocephin in the ED.  I would monitor off  antibiotics for now. Continue bronchodilators Not requiring supplemental oxygen Recent Labs  Lab 06/02/22 1700 06/03/22 0220 06/04/22 0206  WBC 4.6 3.9* 3.3*  PROCALCITON  --  <0.10  --    CHF/HTN Bilateral lower extremity edema PTA on carvedilol 12.5 mg twice daily, amlodipine 5 mg twice daily, prazosin 2 mg at bedtime, torsemide 20 mg daily. Continue Coreg, amlodipine, prazosin. Torsemide held Obtain ultrasound duplex scan of both legs to rule out DVT.  If negative, bilateral TED hoses may help.  CAD, h/o cardiac arrest Coronary artery disease HLD PTA on Coreg, Lipitor, I do not see any antiplatelet or anticoagulant in the list Continue Coreg and Lipitor  Type 2 diabetes mellitus A1c 5 in 2023 Not on antidiabetic meds  Morbid Obesity  Body mass index is 35.95 kg/m. Patient has been advised to make an attempt to improve diet and exercise patterns to aid in weight loss.  OSA   Fall at home Reportedly secondary to losing her balance.  Was unable to get up Pelvic x-ray without hip fracture PT eval requested   Mobility: PT eval ordered  Goals of care   Code Status: Full Code     DVT prophylaxis:  heparin injection 5,000 Units Start: 06/02/22 2200   Antimicrobials: None currently Fluid: NS at 75 mill per hour Consultants: None Family Communication: None at bedside  Status: Inpatient Level of care:  Telemetry Medical   Patient from: Home Anticipated d/c to: Pending clinical course Needs to continue in-hospital care:  Continue to monitor mental status change, renal function monitoring    Diet:  Diet Order             Diet renal with fluid restriction Fluid restriction: 1200 mL Fluid; Room service appropriate? Yes; Fluid consistency: Thin  Diet effective now                   Scheduled Meds:  amLODipine  5 mg Oral BID   atorvastatin  20 mg Oral Daily   carvedilol  12.5 mg Oral BID WC   vitamin B-12  1,000 mcg Oral Daily   folic acid  1 mg  Oral Daily   heparin  5,000 Units Subcutaneous Q8H   lamoTRIgine  25 mg Oral BID   melatonin  3 mg Oral QHS   OLANZapine  5 mg Oral QHS   prazosin  2 mg Oral QHS   pregabalin  50 mg Oral TID    PRN meds: albuterol, melatonin, ondansetron **OR** ondansetron (ZOFRAN) IV, oxyCODONE-acetaminophen   Infusions:   sodium chloride 75 mL/hr at 06/04/22 0848    Antimicrobials: Anti-infectives (From admission, onward)    Start     Dose/Rate Route Frequency Ordered Stop   06/02/22 1900  cefTRIAXone (ROCEPHIN) 1 g in sodium chloride 0.9 % 100 mL IVPB  1 g 200 mL/hr over 30 Minutes Intravenous  Once 06/02/22 1852 06/02/22 2122       Nutritional status:  Body mass index is 35.95 kg/m.          Objective: Vitals:   06/04/22 0521 06/04/22 0922  BP: (!) 157/87 (!) 157/78  Pulse: 66 62  Resp: 18 18  Temp: 98 F (36.7 C) 98 F (36.7 C)  SpO2: 98% 98%    Intake/Output Summary (Last 24 hours) at 06/04/2022 1404 Last data filed at 06/04/2022 1320 Gross per 24 hour  Intake 1105.46 ml  Output 1600 ml  Net -494.54 ml   Filed Weights   06/02/22 2300  Weight: 95 kg   Weight change:  Body mass index is 35.95 kg/m.   Physical Exam: General exam: Elderly African-American female.  Somnolent. Skin: No rashes, lesions or ulcers. HEENT: Atraumatic, normocephalic, no obvious bleeding Lungs: Clear to auscultation bilaterally CVS: Regular rate and rhythm, no murmurs GI/Abd soft, nontender, nondistended, bowel sound present CNS: Somnolent, barely able to open eyes and mumbles.  Per RN was awake earlier before meds Psychiatry: Unable to examine because of altered mentation Extremities: 1+ bilateral pedal edema, no calf tenderness  Data Review: I have personally reviewed the laboratory data and studies available.  F/u labs ordered Unresulted Labs (From admission, onward)     Start     Ordered   06/04/22 0500  Basic metabolic panel  Daily,   R      06/03/22 1110   06/04/22  0500  CBC with Differential/Platelet  Daily,   R      06/03/22 1110            Total time spent in review of labs and imaging, patient evaluation, formulation of plan, documentation and communication with family: 45 minutes  Signed, Lorin Glass, MD [facility] 06/04/2022

## 2022-06-04 NOTE — Evaluation (Signed)
Physical Therapy Evaluation Patient Details Name: AMUNIQUE KOKES MRN: [id number] DOB: Sep 17, 1960 Today's Date: 06/04/2022  History of Present Illness  Tiffany [name] is a 62 y.o. female who, on 5/17, patient lost her balance, fell to the floor, was not able to get up and was later found on the floor by home health nurse;  with PMH significant for obesity, OSA, DM2, HTN, HLD, CAD, CHF, bilateral lower extremity edema on diuretics, CKD, carotid artery disease, h/o cardiac arrest and subsequent anoxic brain injury, history of drug abuse, COPD, anxiety/depression, schizoaffective disorder, GERD.  Patient lives alone, has impaired mobility, uses a motorized scooter  Clinical Impression   Pt admitted with above diagnosis. Lives at home alone (Care attendant 7days a week), in a single-level home with 3 steps to enter (but will be having a ramp built soon); Prior to admission, pt was able to transfer with assist, and was dependent on EMS for getting in and out of her home; Presents to PT with generalized weakness, functional dependencies;  Needing min to mod asisst to get up to eOB and mod assist for transfer OOB to recliner; didn't quite make it all the way to the seat of the recliner -- she is more used to lateral scooting to her wheelchair with armrest pushed back; Pt currently with functional limitations due to the deficits listed below (see PT Problem List). Pt will benefit from skilled PT to increase their independence and safety with mobility to allow discharge to the venue listed below.          Recommendations for follow up therapy are one component of a multi-disciplinary discharge planning process, led by the attending physician.  Recommendations may be updated based on patient status, additional functional criteria and insurance authorization.  Follow Up Recommendations       Assistance Recommended at Discharge Intermittent Supervision/Assistance  Patient can return home with the following   A lot of help with walking and/or transfers;Assistance with cooking/housework;Assist for transportation;Help with stairs or ramp for entrance    Equipment Recommendations None recommended by PT (pretty well-equpped; highly recommend getting a ramp)  Recommendations for Other Services  OT consult (will order per protocol)    Functional Status Assessment Patient has had a recent decline in their functional status and demonstrates the ability to make significant improvements in function in a reasonable and predictable amount of time.     Precautions / Restrictions Precautions Precautions: Fall Restrictions Weight Bearing Restrictions: No      Mobility  Bed Mobility Overal bed mobility: Needs Assistance Bed Mobility: Supine to Sit     Supine to sit: Mod assist, Min guard     General bed mobility comments: Using bedrails; initially mod assist to shift hips towards EOB, however, with incr time once pt is able to get her elbow/UE under her she is able to shift weight well and push up to sit with minguard assist for safety    Transfers Overall transfer level: Needs assistance Equipment used: 2 person hand held assist Transfers: Bed to chair/wheelchair/BSC       Squat pivot transfers: Min assist, Mod assist     General transfer comment: Starts by having recliner (wheelchair at home) at more of an acute angle relative to the bed than a right angle so that she can lean forward from sitting enough to reach armrests and pull self forward; able to raise hips off of teh bed with heavy anterior weight shift; mod assist to steady as pt turns to  sit; sat a bit prematurely this session, and r hip landed (softly) on armrest; assist to shift hip off of armrest and center self in recliner seat    Ambulation/Gait                  Stairs            Wheelchair Mobility    Modified Rankin (Stroke Patients Only)       Balance                                              Pertinent Vitals/Pain Pain Assessment Pain Assessment: No/denies pain    Home Living Family/patient expects to be discharged to:: Private residence Living Arrangements: Alone Available Help at Discharge: Personal care attendant;Available PRN/intermittently Type of Home: Apartment Home Access: Stairs to enter Entrance Stairs-Rails: None Entrance Stairs-Number of Steps: 3; patient currently has EMS take her in and out if needed; has gotten approval for getting a ramp built   Home Layout: One level Home Equipment: Rollator (4 wheels);BSC/3in1;Wheelchair - power;Wheelchair - manual Additional Comments: Pt reports that she sleeps on the couch    Prior Function Prior Level of Function : Needs assist;History of Falls (last six months) (4-5 falls in last 6 months - EMS needs to assist with getting her back up)             Mobility Comments: Pt reports that she mobilizes herself from couch to power wheelchair and uses manual WC ad lib with LE's to keep up strength "when I'm feeling better". Pt reports 4-5 falls in last 6 months when transferring, requiring EMS to assist her in getting back up. ADLs Comments: Pt reports that her Aide helps her with "Everything". Aide assists with bathing and dressing, uses adult diapers. Pt bathes using wipes at baseline. Pt reports Aide provides all IADLs 4 hours a day x7 days a week.     Hand Dominance   Dominant Hand: Right    Extremity/Trunk Assessment   Upper Extremity Assessment Upper Extremity Assessment: Generalized weakness    Lower Extremity Assessment Lower Extremity Assessment: Generalized weakness (and proximal LE ROM limitations related to body habitus)       Communication   Communication: No difficulties  Cognition Arousal/Alertness: Awake/alert Behavior During Therapy: WFL for tasks assessed/performed Overall Cognitive Status: Within Functional Limits for tasks assessed                                           General Comments General comments (skin integrity, edema, etc.): Pleasant and talkative    Exercises     Assessment/Plan    PT Assessment Patient needs continued PT services  PT Problem List Decreased strength;Decreased range of motion;Decreased activity tolerance;Decreased balance;Decreased mobility;Decreased coordination;Decreased knowledge of use of DME;Decreased safety awareness;Decreased knowledge of precautions;Obesity       PT Treatment Interventions DME instruction;Gait training;Functional mobility training;Therapeutic activities;Therapeutic exercise;Balance training;Neuromuscular re-education;Cognitive remediation;Patient/family education;Wheelchair mobility training    PT Goals (Current goals can be found in the Care Plan section)  Acute Rehab PT Goals Patient Stated Goal: Continue to work on her independnece PT Goal Formulation: With patient Time For Goal Achievement: 06/18/22 Potential to Achieve Goals: Good    Frequency Min 3X/week     Co-evaluation  AM-PAC PT "6 Clicks" Mobility  Outcome Measure Help needed turning from your back to your side while in a flat bed without using bedrails?: A Little Help needed moving from lying on your back to sitting on the side of a flat bed without using bedrails?: A Little Help needed moving to and from a bed to a chair (including a wheelchair)?: A Lot Help needed standing up from a chair using your arms (e.g., wheelchair or bedside chair)?: A Lot Help needed to walk in hospital room?: Total Help needed climbing 3-5 steps with a railing? : Total 6 Click Score: 12    End of Session Equipment Utilized During Treatment: Gait belt Activity Tolerance: Patient tolerated treatment well Patient left: in chair;with call bell/phone within reach;with chair alarm set Nurse Communication: Mobility status PT Visit Diagnosis: Other abnormalities of gait and mobility (R26.89)    Time: 8657-8469 PT Time  Calculation (min) (ACUTE ONLY): 22 min   Charges:   PT Evaluation $PT Eval Moderate Complexity: 1 Mod          Van Clines, PT  Acute Rehabilitation Services Office [phone] Secure Chat welcomed   Levi Aland 06/04/2022, 5:38 PM

## 2022-06-05 ENCOUNTER — Inpatient Hospital Stay (HOSPITAL_COMMUNITY): Payer: 59

## 2022-06-05 DIAGNOSIS — N179 Acute kidney failure, unspecified: Secondary | ICD-10-CM | POA: Diagnosis not present

## 2022-06-05 DIAGNOSIS — N184 Chronic kidney disease, stage 4 (severe): Secondary | ICD-10-CM | POA: Diagnosis not present

## 2022-06-05 LAB — URINALYSIS, ROUTINE W REFLEX MICROSCOPIC
Bilirubin Urine: NEGATIVE
Glucose, UA: NEGATIVE mg/dL
Hgb urine dipstick: NEGATIVE
Ketones, ur: NEGATIVE mg/dL
Nitrite: NEGATIVE
Protein, ur: 100 mg/dL — AB
Specific Gravity, Urine: 1.01 (ref 1.005–1.030)
pH: 5 (ref 5.0–8.0)

## 2022-06-05 LAB — CBC WITH DIFFERENTIAL/PLATELET
Abs Immature Granulocytes: 0.02 10*3/uL (ref 0.00–0.07)
Basophils Absolute: 0 10*3/uL (ref 0.0–0.1)
Basophils Relative: 1 %
Eosinophils Absolute: 0.1 10*3/uL (ref 0.0–0.5)
Eosinophils Relative: 4 %
HCT: 27.9 % — ABNORMAL LOW (ref 36.0–46.0)
Hemoglobin: 8.4 g/dL — ABNORMAL LOW (ref 12.0–15.0)
Immature Granulocytes: 1 %
Lymphocytes Relative: 32 %
Lymphs Abs: 1.2 10*3/uL (ref 0.7–4.0)
MCH: 27.3 pg (ref 26.0–34.0)
MCHC: 30.1 g/dL (ref 30.0–36.0)
MCV: 90.6 fL (ref 80.0–100.0)
Monocytes Absolute: 0.5 10*3/uL (ref 0.1–1.0)
Monocytes Relative: 13 %
Neutro Abs: 1.8 10*3/uL (ref 1.7–7.7)
Neutrophils Relative %: 49 %
Platelets: 200 10*3/uL (ref 150–400)
RBC: 3.08 MIL/uL — ABNORMAL LOW (ref 3.87–5.11)
RDW: 17.2 % — ABNORMAL HIGH (ref 11.5–15.5)
WBC: 3.6 10*3/uL — ABNORMAL LOW (ref 4.0–10.5)
nRBC: 0 % (ref 0.0–0.2)

## 2022-06-05 LAB — BASIC METABOLIC PANEL
Anion gap: 7 (ref 5–15)
BUN: 54 mg/dL — ABNORMAL HIGH (ref 8–23)
CO2: 20 mmol/L — ABNORMAL LOW (ref 22–32)
Calcium: 8.4 mg/dL — ABNORMAL LOW (ref 8.9–10.3)
Chloride: 109 mmol/L (ref 98–111)
Creatinine, Ser: 4.13 mg/dL — ABNORMAL HIGH (ref 0.44–1.00)
GFR, Estimated: 12 mL/min — ABNORMAL LOW (ref >60)
Glucose, Bld: 89 mg/dL (ref 70–99)
Potassium: 4.2 mmol/L (ref 3.5–5.1)
Sodium: 136 mmol/L (ref 135–145)

## 2022-06-05 LAB — HEPATITIS PANEL, ACUTE
HCV Ab: NONREACTIVE
Hep A IgM: NONREACTIVE
Hep B C IgM: NONREACTIVE
Hepatitis B Surface Ag: NONREACTIVE

## 2022-06-05 LAB — HIV ANTIBODY (ROUTINE TESTING W REFLEX): HIV Screen 4th Generation wRfx: NONREACTIVE

## 2022-06-05 LAB — PROTEIN / CREATININE RATIO, URINE
Creatinine, Urine: 48 mg/dL
Protein Creatinine Ratio: 2.46 mg/mg{Cre} — ABNORMAL HIGH (ref 0.00–0.15)
Total Protein, Urine: 118 mg/dL

## 2022-06-05 MED ORDER — AMLODIPINE BESYLATE 10 MG PO TABS
10.0000 mg | ORAL_TABLET | Freq: Every day | ORAL | Status: DC
  Administered 2022-06-06 – 2022-06-08 (×3): 10 mg via ORAL
  Filled 2022-06-05 (×3): qty 1

## 2022-06-05 MED ORDER — UMECLIDINIUM BROMIDE 62.5 MCG/ACT IN AEPB
1.0000 | INHALATION_SPRAY | Freq: Every day | RESPIRATORY_TRACT | Status: DC
  Administered 2022-06-05 – 2022-06-08 (×4): 1 via RESPIRATORY_TRACT
  Filled 2022-06-05: qty 7

## 2022-06-05 MED ORDER — FLUTICASONE FUROATE-VILANTEROL 100-25 MCG/ACT IN AEPB
1.0000 | INHALATION_SPRAY | Freq: Every day | RESPIRATORY_TRACT | Status: DC
  Administered 2022-06-05 – 2022-06-08 (×4): 1 via RESPIRATORY_TRACT
  Filled 2022-06-05: qty 28

## 2022-06-05 MED ORDER — MONTELUKAST SODIUM 10 MG PO TABS
10.0000 mg | ORAL_TABLET | Freq: Every day | ORAL | Status: DC
  Administered 2022-06-05 – 2022-06-07 (×3): 10 mg via ORAL
  Filled 2022-06-05 (×3): qty 1

## 2022-06-05 MED ORDER — FUROSEMIDE 10 MG/ML IJ SOLN
80.0000 mg | Freq: Once | INTRAMUSCULAR | Status: AC
  Administered 2022-06-05: 80 mg via INTRAVENOUS
  Filled 2022-06-05: qty 8

## 2022-06-05 NOTE — Progress Notes (Addendum)
Physical Therapy Treatment Patient Details Name: Tiffany [name] MRN: [id number] DOB: [birth year] Today's Date: 06/05/2022   History of Present Illness Tiffany [name] is a 62 y.o. female who, on 5/17, patient lost her balance, fell to the floor, was not able to get up and was later found on the floor by home health nurse;  with PMH significant for obesity, OSA, DM2, HTN, HLD, CAD, CHF, bilateral lower extremity edema on diuretics, CKD, carotid artery disease, h/o cardiac arrest and subsequent anoxic brain injury, history of drug abuse, COPD, anxiety/depression, schizoaffective disorder, GERD.  Patient lives alone, has impaired mobility, uses a motorized scooter    PT Comments    Pt progressing towards physical therapy goals. Motivated to improve functional mobility and utilized Stedy for standing tolerance and pre-gait activity. Pt tolerated standing trials well, holding 20-30" at a time. Will continue to follow and progress as able per POC.     Recommendations for follow up therapy are one component of a multi-disciplinary discharge planning process, led by the attending physician.  Recommendations may be updated based on patient status, additional functional criteria and insurance authorization.  Follow Up Recommendations       Assistance Recommended at Discharge Intermittent Supervision/Assistance  Patient can return home with the following A lot of help with walking and/or transfers;Assistance with cooking/housework;Assist for transportation;Help with stairs or ramp for entrance   Equipment Recommendations   (trapeze bar for pt's hospital bed; highly recommend installing a ramp)    Recommendations for Other Services OT consult     Precautions / Restrictions Precautions Precautions: Fall Precaution Comments: Does not ambulate at baseline, Transfers only. Restrictions Weight Bearing Restrictions: No     Mobility  Bed Mobility Overal bed mobility: Needs Assistance Bed Mobility:  Supine to Sit     Supine to sit: Min assist     General bed mobility comments: Light assist and use of rails for transition to sitting. Increased time to scoot out and get feet on the floor.    Transfers Overall transfer level: Needs assistance Equipment used: Ambulation equipment used Transfers: Bed to chair/wheelchair/BSC       Squat pivot transfers: +2 physical assistance, Min assist     General transfer comment: Pt motivated to increase standing tolerance and improve posture. Opted for use of the [facility] for safe progression. Pt tolerated x2 bouts of standing: ~30" and then ~20". Transfer via Lift Equipment: Stedy  Ambulation/Gait             Pre-gait activities: In standing, pt was able to perform weight shifts during first bout of standing, and then progressed to weight shifts with opposite heel lift during second bout of standing.     Stairs             Wheelchair Mobility    Modified Rankin (Stroke Patients Only)       Balance Overall balance assessment: Needs assistance Sitting-balance support: Feet supported, No upper extremity supported Sitting balance-Leahy Scale: Fair     Standing balance support: Bilateral upper extremity supported, During functional activity, Reliant on assistive device for balance Standing balance-Leahy Scale: Poor                              Cognition Arousal/Alertness: Awake/alert Behavior During Therapy: WFL for tasks assessed/performed Overall Cognitive Status: Within Functional Limits for tasks assessed  Exercises      General Comments        Pertinent Vitals/Pain Pain Assessment Pain Assessment: No/denies pain    Home Living Family/patient expects to be discharged to:: Private residence Living Arrangements: Alone Available Help at Discharge: Personal care attendant;Available PRN/intermittently (4 hours/day 7 days/wk) Type of Home:  Apartment Home Access: Stairs to enter Entrance Stairs-Rails: None Entrance Stairs-Number of Steps: 3; patient currently has EMS take her in and out if needed; has gotten approval for getting a ramp built   Home Layout: One level Home Equipment: Rollator (4 wheels);BSC/3in1;Wheelchair - power;Wheelchair - manual Additional Comments: Pt reports that she sleeps on the couch    Prior Function            PT Goals (current goals can now be found in the care plan section) Acute Rehab PT Goals Patient Stated Goal: To be able to stand longer PT Goal Formulation: With patient Time For Goal Achievement: 06/18/22 Potential to Achieve Goals: Good Progress towards PT goals: Progressing toward goals    Frequency    Min 3X/week      PT Plan Current plan remains appropriate    Co-evaluation              AM-PAC PT "6 Clicks" Mobility   Outcome Measure  Help needed turning from your back to your side while in a flat bed without using bedrails?: A Little Help needed moving from lying on your back to sitting on the side of a flat bed without using bedrails?: A Little Help needed moving to and from a bed to a chair (including a wheelchair)?: A Lot Help needed standing up from a chair using your arms (e.g., wheelchair or bedside chair)?: A Lot Help needed to walk in hospital room?: Total Help needed climbing 3-5 steps with a railing? : Total 6 Click Score: 12    End of Session Equipment Utilized During Treatment: Gait belt Activity Tolerance: Patient tolerated treatment well Patient left: in chair;with call bell/phone within reach;with chair alarm set Nurse Communication: Mobility status PT Visit Diagnosis: Other abnormalities of gait and mobility (R26.89)     Time: 0941-1000 PT Time Calculation (min) (ACUTE ONLY): 19 min  Charges:  $Gait Training: 8-22 mins                     Conni Slipper, PT, DPT Acute Rehabilitation Services Secure Chat Preferred Office:  [phone]    Marylynn Pearson 06/05/2022, 2:46 PM

## 2022-06-05 NOTE — Consult Note (Signed)
[facility] Renal Consultation Note  Requesting MD: Dr. Pola Corn Indication for Consultation: Acute on chronic renal failure  HPI:  Tiffany [name] is a 62 y.o. female with chronic renal disease, schizoaffective disorder, COPD, morbid obesity, obstructive sleep apnea, type II diabetes mellitus, hypertension, hyperlipidemia, coronary artery disease, history of cardiac arrest and anoxic brain jury, history of cocaine use (last use ~2 years ago) admitted to [facility] 5/17 with acute on chronic renal failure. Prior to admission patient reports she was feeling generally weak, not eating or drinking at her baseline. Patient had a reported mechanical fall and was found on the ground by her therapist, who called 911. She states she was only on the ground for 10-20 minutes. On arrival sCr 4.73. She was given intravenous fluids with mild improvement of her renal function, this has now stabilized out with sCr 4.13 and nephrology consulted for further assistance.   Overall, her renal function has been slowly worsening over the last 6 months, as noted by the trend below. It seems this has coincided with a slowly gradual decline in functional status, it sounds like she has not been doing well at home. Patient chronically uses diuretics for her lower extremity edema, although notes as of late the diuretics have not been working as well. She denies any recent fevers, but reports she is consistently chilled. Denies any nausea, vomiting, cough, hemoptysis, abdominal pain, constipation, diarrhea, dysuria, change in urine color, recent night sweats, weight loss, IV drug use. Patient last used cocaine roughly two years ago and has not been sexually active in the last six years.   Creat  Date/Time Value Ref Range Status  02/18/2016 11:46 AM 1.22 (H) 0.50 - 1.05 mg/dL Final    Comment:      For patients > or = 63 years of age: The upper reference limit for Creatinine is approximately 13% higher for people identified  as African-American.     12/16/2015 12:29 PM 1.50 (H) 0.50 - 1.05 mg/dL Final    Comment:      For patients > or = 63 years of age: The upper reference limit for Creatinine is approximately 13% higher for people identified as African-American.     08/24/2015 03:05 PM 1.06 (H) 0.50 - 1.05 mg/dL Final    Comment:      For patients > or = 63 years of age: The upper reference limit for Creatinine is approximately 13% higher for people identified as African-American.     01/20/2015 04:11 PM 1.13 (H) 0.50 - 1.05 mg/dL Final  16/10/9602 54:09 AM 0.98 0.50 - 1.05 mg/dL Final  81/19/1478 29:56 PM 1.40 (H) 0.50 - 1.05 mg/dL Final  21/30/8657 84:69 AM 1.08 0.50 - 1.10 mg/dL Final  62/95/2841 32:44 PM 1.01 0.50 - 1.10 mg/dL Final  01/18/7251 66:44 PM 0.79 0.50 - 1.10 mg/dL Final  03/47/4259 56:38 PM 0.99 0.50 - 1.10 mg/dL Final  75/64/3329 51:88 PM 0.89 0.50 - 1.10 mg/dL Final  41/66/0630 16:01 PM 1.57 (H) 0.50 - 1.10 mg/dL Final   Creatinine, Ser  Date/Time Value Ref Range Status  06/05/2022 01:53 AM 4.13 (H) 0.44 - 1.00 mg/dL Final  09/32/3557 32:20 AM 4.55 (H) 0.44 - 1.00 mg/dL Final  25/42/7062 37:62 AM 4.51 (H) 0.44 - 1.00 mg/dL Final  83/15/1761 60:73 PM 4.73 (H) 0.44 - 1.00 mg/dL Final  71/06/2692 85:46 PM 4.19 (H) 0.57 - 1.00 mg/dL Final  27/03/5007 38:18 PM 3.65 (H) 0.57 - 1.00 mg/dL Final  29/93/7169 67:89 AM 2.73 (H)  0.44 - 1.00 mg/dL Final  16/10/9602 54:09 AM 2.90 (H) 0.44 - 1.00 mg/dL Final  81/19/1478 29:56 AM 3.27 (H) 0.44 - 1.00 mg/dL Final  21/30/8657 84:69 AM 2.74 (H) 0.44 - 1.00 mg/dL Final  62/95/2841 32:44 AM 2.95 (H) 0.44 - 1.00 mg/dL Final  01/18/7251 66:44 AM 3.08 (H) 0.44 - 1.00 mg/dL Final  03/47/4259 56:38 PM 3.14 (H) 0.44 - 1.00 mg/dL Final  75/64/3329 51:88 PM 3.27 (H) 0.44 - 1.00 mg/dL Final  41/66/0630 16:01 AM 3.36 (H) 0.44 - 1.00 mg/dL Final  09/32/3557 32:20 PM 2.83 (H) 0.57 - 1.00 mg/dL Final  25/42/7062 37:62 AM 2.70 (H) 0.44 - 1.00 mg/dL Final   83/15/1761 60:73 PM 2.69 (H) 0.44 - 1.00 mg/dL Final  71/06/2692 85:46 AM 2.62 (H) 0.44 - 1.00 mg/dL Final  27/03/5007 38:18 AM 2.89 (H) 0.44 - 1.00 mg/dL Final  29/93/7169 67:89 AM 2.77 (H) 0.44 - 1.00 mg/dL Final  38/10/1749 02:58 AM 2.90 (H) 0.44 - 1.00 mg/dL Final  52/77/8242 35:36 PM 2.93 (H) 0.44 - 1.00 mg/dL Final  14/43/1540 08:67 PM 2.48 (H) 0.57 - 1.00 mg/dL Final  61/95/0932 67:12 PM 1.28 (H) 0.44 - 1.00 mg/dL Final  45/80/9983 38:25 PM 1.82 (H) 0.44 - 1.00 mg/dL Final  05/39/7673 41:93 AM 1.00 0.44 - 1.00 mg/dL Final  79/02/4095 35:32 AM 1.49 (H) 0.44 - 1.00 mg/dL Final  99/24/2683 41:96 AM 1.14 (H) 0.44 - 1.00 mg/dL Final  22/29/7989 21:19 AM 1.27 (H) 0.44 - 1.00 mg/dL Final  41/74/0814 48:18 PM 1.39 (H) 0.44 - 1.00 mg/dL Final  56/31/4970 26:37 PM 1.72 (H) 0.44 - 1.00 mg/dL Final  85/88/5027 74:12 AM 1.57 (H) 0.44 - 1.00 mg/dL Final  87/86/7672 09:47 AM 1.62 (H) 0.44 - 1.00 mg/dL Final  09/62/8366 29:47 AM 1.95 (H) 0.44 - 1.00 mg/dL Final  65/46/5035 46:56 PM 2.16 (H) 0.44 - 1.00 mg/dL Final  81/27/5170 01:74 AM 1.69 (H) 0.44 - 1.00 mg/dL Final  94/49/6759 16:38 AM 1.40 (H) 0.44 - 1.00 mg/dL Final  46/65/9935 70:17 AM 1.41 (H) 0.44 - 1.00 mg/dL Final  79/39/0300 92:33 AM 1.64 (H) 0.44 - 1.00 mg/dL Final  00/76/2263 33:54 AM 1.19 (H) 0.44 - 1.00 mg/dL Final  56/25/6389 37:34 AM 1.21 (H) 0.44 - 1.00 mg/dL Final  28/76/8115 72:62 AM 1.25 (H) 0.44 - 1.00 mg/dL Final  03/55/9741 63:84 AM 1.29 (H) 0.44 - 1.00 mg/dL Final  53/64/6803 21:22 PM 1.72 (H) 0.44 - 1.00 mg/dL Final  48/25/0037 04:88 PM 1.68 (H) 0.44 - 1.00 mg/dL Final  89/16/9450 38:88 AM 1.70 (H) 0.44 - 1.00 mg/dL Final  28/00/3491 79:15 AM 1.61 (H) 0.44 - 1.00 mg/dL Final  05/69/7948 01:65 AM 1.77 (H) 0.44 - 1.00 mg/dL Final  53/74/8270 78:67 AM 1.83 (H) 0.44 - 1.00 mg/dL Final  54/49/2010 07:12 AM 1.10 (H) 0.44 - 1.00 mg/dL Final  19/75/8832 54:98 AM 1.89 (H) 0.44 - 1.00 mg/dL Final    PMHx: Past Medical  History:  Diagnosis Date   Agitation 11/22/2017   Anoxic brain injury (HCC) 09/08/2016   C. Arrest due to respiratory failure and COPD exacerbation   Anxiety    Arthritis    "all over" (04/10/2016)   Asthma 10/18/2010   Binge eating disorder    Cardiac arrest (HCC) 09/08/2016   PEA   Carotid artery stenosis    1-39% bilateral by dopplers 11/2016   Chronic diastolic (congestive) heart failure (HCC)    Chronic kidney disease, stage 3 (HCC)    Chronic pain  syndrome 06/18/2012   Chronic post-traumatic stress disorder (PTSD) 05/27/2018   Chronic respiratory failure with hypoxia and hypercapnia (HCC) 06/22/2015   TRILOGY Vent >AVAPA-ES., Vt target 200-400, Max P 30 , PS max 20 , PS min 6-10 , E Max 6, E Min 4, Rate Auto AVAPS Rate 2 (titrate for pt comfort) , bleed O2 at 5l/m continuous flow .    CKD (chronic kidney disease) 02/03/2022   Closed displaced fracture of fifth metacarpal bone 03/21/2018   Cocaine use disorder, severe, in sustained remission (HCC) 12/17/2015   Complication of anesthesia    decreased bp, decreased heart rate   COPD (chronic obstructive pulmonary disease) (HCC) 07/08/2014   Depression    Diabetic neuropathy (HCC) 04/24/2011   Difficulty with speech 01/24/2018   Disorder of nervous system    Drug abuse (HCC) 11/21/2017   Dyslipidemia 04/24/2011   Elevated troponin 04/28/2012   Emphysema    Encephalopathy 11/21/2017   Essential hypertension 03/22/2016   Fibula fracture 07/10/2016   Frequent falls 10/11/2017   GERD (gastroesophageal reflux disease)    Gout 04/11/2017   Heart attack (HCC) 1980s   History of blood transfusion 1994   "couldn't stop bleeding from my period"   History of drug abuse in remission (HCC) 11/28/2015   Quit in 2017   Hyperlipidemia LDL goal <70    Incontinence    Manic depression (HCC)    Morbid obesity (HCC) 10/18/2010   Obstructive sleep apnea 10/18/2010   On home oxygen therapy    "6L; 24/7" (04/10/2016)   OSA on CPAP     "wear mask sometimes" (04/10/2016)   Paranoid (HCC)    "sometimes; I'm on RX for it" (04/10/2016)   Prolonged Q-T interval on ECG    Rectal bleeding 12/31/2015   Schizoaffective disorder, bipolar type (HCC) 04/05/2018   Seasonal allergies    Seborrheic keratoses 12/31/2013   Seizures (HCC)    "don't know what kind; last one was ~ 1 yr ago" (04/10/2016)   Sinus bradycardia    Stroke [facility]) 1980s   denies residual on 04/10/2016   Thrush 09/19/2013   Type 2 diabetes mellitus (HCC) 10/18/2010   Past Surgical History:  Procedure Laterality Date   CESAREAN SECTION  1997   COLONOSCOPY WITH PROPOFOL N/A 04/01/2021   Procedure: COLONOSCOPY WITH PROPOFOL;  Surgeon: Jeani Hawking, MD;  Location: [facility] ENDOSCOPY;  Service: Gastroenterology;  Laterality: N/A;  Rectal bleeding with drop in hemoglobin to 7.2 g/dL   HERNIA REPAIR     IR CHOLANGIOGRAM EXISTING TUBE  07/20/2016   IR PERC CHOLECYSTOSTOMY  05/10/2016   IR RADIOLOGIST EVAL & MGMT  06/08/2016   IR RADIOLOGIST EVAL & MGMT  06/29/2016   IR SINUS/FIST TUBE CHK-NON GI  07/12/2016   RIGHT/LEFT HEART CATH AND CORONARY ANGIOGRAPHY N/A 06/19/2017   Procedure: RIGHT/LEFT HEART CATH AND CORONARY ANGIOGRAPHY;  Surgeon: Dolores Patty, MD;  Location: [facility] INVASIVE CV LAB;  Service: Cardiovascular;  Laterality: N/A;   TIBIA IM NAIL INSERTION Right 07/12/2016   Procedure: INTRAMEDULLARY (IM) NAIL RIGHT TIBIA;  Surgeon: Tarry Kos, MD;  Location: [facility] OR;  Service: Orthopedics;  Laterality: Right;   UMBILICAL HERNIA REPAIR  ~ 1963   "that's why I don't have a belly button"   VAGINAL HYSTERECTOMY     Family Hx:  Family History  Problem Relation Age of Onset   Cancer Mother        lung   Depression Mother    Cancer Father  prostate   Depression Sister    Anxiety disorder Sister    Schizophrenia Sister    Bipolar disorder Sister    Depression Sister    Depression Brother    Heart failure Other        cousin    Social History:  reports that she  quit smoking about 6 years ago. Her smoking use included cigarettes. She started smoking about 45 years ago. She has a 57.00 pack-year smoking history. She has been exposed to tobacco smoke. She has never used smokeless tobacco. She reports that she does not currently use drugs after having used the following drugs: Cocaine. She reports that she does not drink alcohol.  Allergies:  Allergies  Allergen Reactions   Aspirin     GI bleed x2   Hydrocodone Shortness Of Breath   Latuda [Lurasidone Hcl] Anaphylaxis   Magnesium-Containing Compounds Anaphylaxis and Other (See Comments)    Tolerated Ensure   Prednisone Anaphylaxis, Swelling and Other (See Comments)    Tongue swelling, lip swelling, throat swelling, per pt    Tramadol Anaphylaxis, Swelling and Rash   Codeine Nausea And Vomiting and Rash   Trazodone Other (See Comments)    paranoia   Peanut-Containing Drug Products Hives    Raw peanuts, but can eat peanut butter   Topamax [Topiramate] Other (See Comments)    Increases paranoia   Sulfa Antibiotics Itching   Tape Rash   Zetia [Ezetimibe] Itching and Rash   Medications: Prior to Admission medications   Medication Sig Start Date End Date Taking? Authorizing Provider  albuterol (VENTOLIN HFA) 108 (90 Base) MCG/ACT inhaler Inhale 2 puffs into the lungs every 6 (six) hours as needed for wheezing or shortness of breath. 01/05/22  Yes Tiffany Kocher, DO  amLODipine (NORVASC) 10 MG tablet Take 10 mg by mouth daily.   Yes [provider]  atorvastatin (LIPITOR) 20 MG tablet Take 1 tablet (20 mg total) by mouth daily. 04/03/22  Yes Tiffany Kocher, DO  carvedilol (COREG) 12.5 MG tablet Take 1 tablet (12.5 mg total) by mouth 2 (two) times daily. 03/07/22  Yes Pricilla Riffle, MD  cyclobenzaprine (FLEXERIL) 10 MG tablet Take 10 mg by mouth 3 (three) times daily as needed for muscle spasms. 05/22/22  Yes [provider]  dicyclomine (BENTYL) 10 MG capsule Take 10 mg by mouth 3  (three) times daily. 04/24/22  Yes [provider]  escitalopram (LEXAPRO) 20 MG tablet Take 1 tablet (20 mg total) by mouth daily. 04/03/22  Yes Tiffany Kocher, DO  Fluticasone-Umeclidin-Vilant (TRELEGY ELLIPTA) 100-62.5-25 MCG/ACT AEPB INHALE 1 PUFF INTO THE LUNGS DAILY 03/20/22  Yes Tiffany Kocher, DO  lamoTRIgine (LAMICTAL) 25 MG tablet Take 1 tablet (25 mg total) by mouth 2 (two) times daily. Patient taking differently: Take 100 mg by mouth 2 (two) times daily. 05/17/22  Yes Tiffany Kocher, DO  melatonin 3 MG TABS tablet Take 1 tablet (3 mg total) by mouth at bedtime. 04/03/22  Yes Tiffany Kocher, DO  montelukast (SINGULAIR) 10 MG tablet Take 10 mg by mouth at bedtime. 10/16/18  Yes [provider]  OLANZapine (ZYPREXA) 5 MG tablet Take 1 tablet (5 mg total) by mouth daily. 02/27/22  Yes Hensel, Santiago Bumpers, MD  omeprazole (PRILOSEC) 40 MG capsule Take 1 capsule (40 mg total) by mouth every morning. 04/03/22  Yes Tiffany Kocher, DO  Potassium Chloride ER 20 MEQ TBCR Take 20 mEq by mouth daily. 03/13/22  Yes [provider]  prazosin (MINIPRESS) 2 MG  capsule TAKE 1 CAPSULE(2 MG) BY MOUTH AT BEDTIME Patient taking differently: Take 2 mg by mouth 2 (two) times daily. 05/17/22  Yes Tiffany Kocher, DO  pregabalin (LYRICA) 50 MG capsule Take 1 capsule (50 mg total) by mouth 3 (three) times daily. Patient taking differently: Take 100 mg by mouth 3 (three) times daily. 04/24/22 06/04/22 Yes Tiffany Kocher, DO  torsemide (DEMADEX) 20 MG tablet Take 1 tablet (20 mg total) by mouth daily. 05/22/22 08/20/22 Yes Pricilla Riffle, MD  Capsaicin 0.033 % CREA Apply a thin layer of cream twice daily to most painful areas of feet and legs. Patient not taking: Reported on 06/04/2022 04/07/22   Tiffany Kocher, DO  DULoxetine (CYMBALTA) 30 MG capsule TAKE 1 CAPSULE(30 MG) BY MOUTH DAILY 06/05/22   Tiffany Kocher, DO  loperamide (IMODIUM) 2 MG capsule TAKE ONE CAPSULE BY MOUTH THREE TIMES DAILY AS NEEDED FOR  DIARRHEA OR LOOSE STOOLS Patient not taking: Reported on 06/04/2022 05/19/22   Tiffany Kocher, DO  SAPHRIS 5 MG SUBL 24 hr tablet Place 5 mg under the tongue 2 (two) times daily. Patient not taking: Reported on 06/04/2022 01/27/22   [provider]    I have reviewed the patient's current medications.  Labs:  Results for orders placed or performed during the hospital encounter of 06/02/22 (from the past 48 hour(s))  Vitamin B12     Status: None   Collection Time: 06/04/22  2:06 AM  Result Value Ref Range   Vitamin B-12 301 180 - 914 pg/mL    Comment: (NOTE) This assay is not validated for testing neonatal or myeloproliferative syndrome specimens for Vitamin B12 levels. Performed at [facility], [address] [place], [place] [address]   Iron and TIBC     Status: None   Collection Time: 06/04/22  2:06 AM  Result Value Ref Range   Iron 61 28 - 170 ug/dL   TIBC 478 295 - 621 ug/dL   Saturation Ratios 21 10.4 - 31.8 %   UIBC 226 ug/dL    Comment: Performed at [facility], [address] [place], [place] [address]  Ferritin     Status: None   Collection Time: 06/04/22  2:06 AM  Result Value Ref Range   Ferritin 30 11 - 307 ng/mL    Comment: Performed at [facility], [address], [place], [place] [address]  Reticulocytes     Status: Abnormal   Collection Time: 06/04/22  2:06 AM  Result Value Ref Range   Retic Ct Pct 1.7 0.4 - 3.1 %   RBC. 2.93 (L) 3.87 - 5.11 MIL/uL   Retic Count, Absolute 51.0 19.0 - 186.0 K/uL   Immature Retic Fract 7.0 2.3 - 15.9 %    Comment: Performed at [facility], [address], [place], [place] [address]  Basic metabolic panel     Status: Abnormal   Collection Time: 06/04/22  2:06 AM  Result Value Ref Range   Sodium 140 135 - 145 mmol/L   Potassium 4.7 3.5 - 5.1 mmol/L   Chloride 112 (H) 98 - 111 mmol/L   CO2 21 (L) 22 - 32 mmol/L   Glucose, Bld 90 70 - 99 mg/dL    Comment: Glucose reference range applies only  to samples taken after fasting for at least 8 hours.   BUN 54 (H) 8 - 23 mg/dL   Creatinine, Ser 8.41 (H) 0.44 - 1.00 mg/dL   Calcium 8.1 (L)  8.9 - 10.3 mg/dL   GFR, Estimated 10 (L) >60 mL/min    Comment: (NOTE) Calculated using the CKD-EPI Creatinine Equation (2021)    Anion gap 7 5 - 15    Comment: Performed at [facility], [address], [place], [place] [address]  CBC with Differential/Platelet     Status: Abnormal   Collection Time: 06/04/22  2:06 AM  Result Value Ref Range   WBC 3.3 (L) 4.0 - 10.5 K/uL   RBC 2.89 (L) 3.87 - 5.11 MIL/uL   Hemoglobin 8.0 (L) 12.0 - 15.0 g/dL   HCT 86.5 (L) 78.4 - 69.6 %   MCV 90.7 80.0 - 100.0 fL   MCH 27.7 26.0 - 34.0 pg   MCHC 30.5 30.0 - 36.0 g/dL   RDW 29.5 (H) 28.4 - 13.2 %   Platelets 206 150 - 400 K/uL   nRBC 0.0 0.0 - 0.2 %   Neutrophils Relative % 51 %   Neutro Abs 1.7 1.7 - 7.7 K/uL   Lymphocytes Relative 33 %   Lymphs Abs 1.1 0.7 - 4.0 K/uL   Monocytes Relative 13 %   Monocytes Absolute 0.4 0.1 - 1.0 K/uL   Eosinophils Relative 3 %   Eosinophils Absolute 0.1 0.0 - 0.5 K/uL   Basophils Relative 0 %   Basophils Absolute 0.0 0.0 - 0.1 K/uL   Immature Granulocytes 0 %   Abs Immature Granulocytes 0.01 0.00 - 0.07 K/uL    Comment: Performed at [facility], [address] [place], [place] [address]  Folate     Status: Abnormal   Collection Time: 06/04/22  2:06 AM  Result Value Ref Range   Folate 4.1 (L) >5.9 ng/mL    Comment: Performed at [facility], [address], [place], [place] [address]  Basic metabolic panel     Status: Abnormal   Collection Time: 06/05/22  1:53 AM  Result Value Ref Range   Sodium 136 135 - 145 mmol/L   Potassium 4.2 3.5 - 5.1 mmol/L   Chloride 109 98 - 111 mmol/L   CO2 20 (L) 22 - 32 mmol/L   Glucose, Bld 89 70 - 99 mg/dL    Comment: Glucose reference range applies only to samples taken after fasting for at least 8 hours.   BUN 54 (H) 8 - 23 mg/dL   Creatinine, Ser 6.64 (H)  0.44 - 1.00 mg/dL   Calcium 8.4 (L) 8.9 - 10.3 mg/dL   GFR, Estimated 12 (L) >60 mL/min    Comment: (NOTE) Calculated using the CKD-EPI Creatinine Equation (2021)    Anion gap 7 5 - 15    Comment: Performed at [facility], [address], [place], [place] [address]  CBC with Differential/Platelet     Status: Abnormal   Collection Time: 06/05/22  1:53 AM  Result Value Ref Range   WBC 3.6 (L) 4.0 - 10.5 K/uL   RBC 3.08 (L) 3.87 - 5.11 MIL/uL   Hemoglobin 8.4 (L) 12.0 - 15.0 g/dL   HCT 42.5 (L) 95.6 - 38.7 %   MCV 90.6 80.0 - 100.0 fL   MCH 27.3 26.0 - 34.0 pg   MCHC 30.1 30.0 - 36.0 g/dL   RDW 56.4 (H) 33.2 - 95.1 %   Platelets 200 150 - 400 K/uL   nRBC 0.0 0.0 - 0.2 %   Neutrophils Relative % 49 %   Neutro Abs 1.8 1.7 - 7.7 K/uL   Lymphocytes Relative 32 %  Lymphs Abs 1.2 0.7 - 4.0 K/uL   Monocytes Relative 13 %   Monocytes Absolute 0.5 0.1 - 1.0 K/uL   Eosinophils Relative 4 %   Eosinophils Absolute 0.1 0.0 - 0.5 K/uL   Basophils Relative 1 %   Basophils Absolute 0.0 0.0 - 0.1 K/uL   Immature Granulocytes 1 %   Abs Immature Granulocytes 0.02 0.00 - 0.07 K/uL    Comment: Performed at [facility], [address] [place], [place] [address]   *Note: Due to a large number of results and/or encounters for the requested time period, some results have not been displayed. A complete set of results can be found in Results Review.     ROS:  As noted in HPI above.   Physical Exam: Vitals:   06/05/22 0604 06/05/22 0913  BP: (!) 153/75 (!) 157/79  Pulse: (!) 57 63  Resp: 16   Temp: 97.7 F (36.5 C)   SpO2: 96% 95%     General: Chronically ill-appearing person in no acute distress HEENT: Normocephalic, atraumatic. Moist mucous membranes. Eyes: Vision grossly in tact. No scleral icterus or conjunctival injection.  Heart: Regular rate, rhythm. No murmurs. Warm extremities.  Lungs: Normal work of breathing on room air. Mild rales bilateral bases. Mild  end-expiratory wheezing appreciated.  Abdomen: Soft, non-distended, mild tenderness in left lower quadrant. No hepatomegaly appreciated.  Extremities: Significant pitting edema 3+ to hips bilaterally.  Skin: Warm, dry. No rashes, lesions, petechiae appreciated.  Neuro: Awake, alert, conversing appropriately. Grossly non-focal.   Assessment/Plan: Oliguric acute kidney injury in setting of chronic renal disease: Renal function initially responded to IV fluids, but I do not think she would benefit from further fluids given she is showing signs of hypervolemia on exam. Initial UA with significant proteinuria, hemoglobinuria without casts noted. Albumin low-normal at 3.5, calcium low-normal at 8.9. Will plan for repeat renal ultrasound given renal function has vastly worsened since January. I would also like to quantify her proteinuria with a protein/creatinine ratio. I am concerned for potential intrinsic causes of acute renal failure, will send off hepatitis, HIV, autoimmune, SPEP labs for further work-up. Might need renal biopsy pending lab results. Does have mild metabolic acidosis, do not think she needs bicarb tablets at this time.  Chronic anemia: Appears to be at baseline, no signs or symptoms of acute bleeding. Likely 2/2 renal disease.  COPD/OSA: On bronchodilators, sating well on room air currently.  Chronic diastolic heart failure: Continue holding diuretics in setting of AKI. Rest per primary CAD with history of cardiac arrest: Per primary Type II diabetes mellitus: Sugars at goal, last A1c at goal.  Evlyn Kanner 06/05/2022, 12:02 PM

## 2022-06-05 NOTE — Progress Notes (Signed)
PROGRESS NOTE  Tiffany [name]  DOB: 10-31-1960  PCP: Tiffany Kocher, DO UJW:[id number]  DOA: 06/02/2022  LOS: 3 days  Hospital Day: 4  Brief narrative: Tiffany [name] is a 62 y.o. female with PMH significant for obesity, OSA, DM2, HTN, HLD, CAD, CHF, bilateral lower extremity edema on diuretics, CKD, carotid artery disease, h/o cardiac arrest and subsequent anoxic brain injury, history of drug abuse, COPD, anxiety/depression, schizoaffective disorder, GERD.  Patient lives alone, has impaired mobility, uses a motorized scooter  5/17, patient lost her balance, fell to the floor, was not able to get up and was later found on the floor by home health nurse.  EMS noted that she was lethargic and hypoxic, started on 4 L oxygen and brought to the ED.  In the ED, patient was afebrile, heart rate 58, blood pressure 130s, breathing on room air She was noted to have severe swelling and tenderness in both her legs. Labs showed hemoglobin low at 8.3, creatinine elevated 4.73. Urinalysis with clear yellow urine, moderate hemoglobin, negative leukocytes, rare bacteria CT head did not show any evidence of acute intracranial malady.  It showed atrophy and chronic small vessel ischemic disease.   Chest x-ray showed cardiomegaly with mild central congestion, possible patchy right mid and lower lung airspace disease Hip x-ray negative for fracture. Admitted to [facility]  Subjective: Patient was seen and examined this morning.  Pleasant, middle-aged African-American female.  Looks older for age.  Sitting up in recliner.  Not in distress.  She states she has tried TED hose in the past without improvement in pedal edema. Labs this morning with creatinine down to 4.13  Assessment and plan: AKI on CKD 4 Proteinuria Baseline creatinine seems to be less than 3 until February 2024 after which she seems to have progressive worsening renal function.  Most recent creatinine was 4.19 two weeks ago.  On diuretics at home  for chronic lymphedema.   Presented with creatinine elevated to 4.73.  Urine protein more than 300 Most recent renal ultrasound from January 2024 with chronic medical renal disease and no obstruction. Currently diuretics on hold.  [name] receiving IV fluid.  In the last 24 hours, creatinine has improved to 4.13. Does not have a nephrologist as an outpatient.  Given her elevated creatinine and relative hypervolemic state, I would request a consultation to nephrology. Recent Labs    03/03/22 0532 03/04/22 0605 03/05/22 0339 03/06/22 0524 05/05/22 1315 05/19/22 1408 06/02/22 1700 06/03/22 0220 06/04/22 0206 06/05/22 0153  BUN 59* 56* 61* 64* 50* 49* 48* 48* 54* 54*  CREATININE 2.74* 3.27* 2.90* 2.73* 3.65* 4.19* 4.73* 4.51* 4.55* 4.13*   Acute metabolic encephalopathy Schizoaffective disorder Anxiety/depression For the first 24 hours, patient was somnolent.  Suspect due to uremia as well as the effect of morning psych meds.  Mental status gradually improved. PTA on multiple mood altering medications including Cymbalta 30 mg daily, Lexapro 20 mg daily, Lamictal 100 mg twice daily, Zyprexa 5 mg at bedtime, Lyrica 100 mg 3 times daily, Flexeril 10 mg 3 times daily as needed, Saphris 5 mg twice daily. Currently continued on Lamictal, Zyprexa, Lyrica and Flexeril.  Cymbalta, Lexapro and safaris on hold.  Mental status much improved but still slow to respond.  Continue to monitor  Chronic anemia Hemoglobin at baseline between 8 and 9.  Likely has anemia of chronic disease.  No active bleeding.  Continue to monitor. Recent Labs    06/02/22 1700 06/02/22 1843 06/03/22 0220 06/04/22 0206 06/05/22  0153  HGB 8.3* 10.5* 7.8* 8.0* 8.4*  MCV 90.8  --  88.7 90.7 90.6  VITAMINB12  --   --   --  301  --   FOLATE  --   --   --  4.1*  --   FERRITIN  --   --   --  30  --   TIBC  --   --   --  287  --   IRON  --   --   --  61  --   RETICCTPCT  --   --   --  1.7  --    Abnormal chest  x-ray COPD Chest x-ray showed mild congestion, patchy right sided airspace disease. No fever.  WBC count not elevated.  Procalcitonin level low. Given empiric IV Rocephin in the ED. No further active given.  Remains stable.  Continue bronchodilators Not requiring supplemental oxygen Recent Labs  Lab 06/02/22 1700 06/03/22 0220 06/04/22 0206 06/05/22 0153  WBC 4.6 3.9* 3.3* 3.6*  PROCALCITON  --  <0.10  --   --    CHF/HTN Bilateral lower extremity edema PTA on carvedilol 12.5 mg twice daily, amlodipine 5 mg twice daily, prazosin 2 mg at bedtime, torsemide 20 mg daily. Continue Coreg, amlodipine, prazosin. Torsemide on hold because of AKI.   Ultrasound duplex scan of both legs negative for DVT.  TED hoses ordered.  CAD, h/o cardiac arrest Coronary artery disease HLD PTA on Coreg, Lipitor, I do not see any antiplatelet or anticoagulant in the list Continue Coreg and Lipitor  Type 2 diabetes mellitus A1c 5 in 2023 Not on antidiabetic meds  Morbid Obesity  Body mass index is 35.95 kg/m. Patient has been advised to make an attempt to improve diet and exercise patterns to aid in weight loss.  OSA   Fall at home Reportedly secondary to losing her balance.  Was unable to get up Pelvic x-ray without hip fracture PT eval obtained.  Home with PT recommended.  Goals of care   Code Status: Full Code     DVT prophylaxis:  [name] TED hose Start: 06/05/22 0819 heparin injection 5,000 Units Start: 06/02/22 2200   Antimicrobials: None currently Fluid: NS at 75 mill per hour.  Defer to nephrology for further management Consultants: None Family Communication: None at bedside  Status: Inpatient Level of care:  Telemetry Medical   Patient from: Home Anticipated d/c to: Pending clinical course Needs to continue in-hospital care:  Nephrology evaluation.    Diet:  Diet Order             Diet renal with fluid restriction Fluid restriction: 1200 mL Fluid; Room service  appropriate? Yes; Fluid consistency: Thin  Diet effective now                   Scheduled Meds:  [START ON 06/06/2022] amLODipine  10 mg Oral Daily   atorvastatin  20 mg Oral Daily   carvedilol  12.5 mg Oral BID WC   vitamin B-12  1,000 mcg Oral Daily   fluticasone furoate-vilanterol  1 puff Inhalation Daily   And   umeclidinium bromide  1 puff Inhalation Daily   folic acid  1 mg Oral Daily   heparin  5,000 Units Subcutaneous Q8H   lamoTRIgine  100 mg Oral BID   melatonin  3 mg Oral QHS   montelukast  10 mg Oral QHS   OLANZapine  5 mg Oral QHS   prazosin  2 mg Oral QHS  pregabalin  50 mg Oral TID    PRN meds: albuterol, cyclobenzaprine, melatonin, ondansetron **OR** ondansetron (ZOFRAN) IV, oxyCODONE-acetaminophen   Infusions:   sodium chloride 75 mL/hr at 06/04/22 2359    Antimicrobials: Anti-infectives (From admission, onward)    Start     Dose/Rate Route Frequency Ordered Stop   06/02/22 1900  cefTRIAXone (ROCEPHIN) 1 g in sodium chloride 0.9 % 100 mL IVPB        1 g 200 mL/hr over 30 Minutes Intravenous  Once 06/02/22 1852 06/02/22 2122       Nutritional status:  Body mass index is 35.95 kg/m.          Objective: Vitals:   06/05/22 0604 06/05/22 0913  BP: (!) 153/75 (!) 157/79  Pulse: (!) 57 63  Resp: 16   Temp: 97.7 F (36.5 C)   SpO2: 96% 95%    Intake/Output Summary (Last 24 hours) at 06/05/2022 1118 Last data filed at 06/05/2022 0600 Gross per 24 hour  Intake 2031.33 ml  Output 400 ml  Net 1631.33 ml   Filed Weights   06/02/22 2300  Weight: 95 kg   Weight change:  Body mass index is 35.95 kg/m.   Physical Exam: General exam: Elderly African-American female.  Not in pain  skin: No rashes, lesions or ulcers. HEENT: Atraumatic, normocephalic, no obvious bleeding Lungs: Clear to auscultation bilaterally CVS: Regular rate and rhythm, no murmurs GI/Abd soft, nontender, nondistended, bowel sound present CNS: Alert, awake, slow to  respond but oriented x 3. Psychiatry: Unable to examine because of altered mentation Extremities: Continues to have 1+ bilateral pedal edema, no calf tenderness  Data Review: I have personally reviewed the laboratory data and studies available.  F/u labs ordered Unresulted Labs (From admission, onward)     Start     Ordered   06/04/22 0500  Basic metabolic panel  Daily,   R      06/03/22 1110   06/04/22 0500  CBC with Differential/Platelet  Daily,   R      06/03/22 1110            Total time spent in review of labs and imaging, patient evaluation, formulation of plan, documentation and communication with family: 45 minutes  Signed, Lorin Glass, MD [facility] 06/05/2022

## 2022-06-05 NOTE — Evaluation (Addendum)
Occupational Therapy Evaluation Patient Details Name: Tiffany [name] MRN: [id number] DOB: [birth year] Today's Date: 06/05/2022   History of Present Illness Tiffany [name] is a 62 y.o. female who, on 5/17, patient lost her balance, fell to the floor, was not able to get up and was later found on the floor by home health nurse;  with PMH significant for obesity, OSA, DM2, HTN, HLD, CAD, CHF, bilateral lower extremity edema on diuretics, CKD, carotid artery disease, h/o cardiac arrest and subsequent anoxic brain injury, history of drug abuse, COPD, anxiety/depression, schizoaffective disorder, GERD.  Patient lives alone, has impaired mobility, uses a motorized scooter   Clinical Impression   PTA pt lives alone and has a PCA 4 hrs/day 7 days/wk who assists with IADL tasks. At baseline pt states she is independent with her self care @ wc level. Pt has had several falls and is dependent on EMS for transport to appointments and for lifting assistance. Pt would benefit from a fall alert system. Recommend HHOT after DC. Acute OT to follow to facilitate safe DC home.       Recommendations for follow up therapy are one component of a multi-disciplinary discharge planning process, led by the attending physician.  Recommendations may be updated based on patient status, additional functional criteria and insurance authorization.   Assistance Recommended at Discharge Intermittent Supervision/Assistance  Patient can return home with the following A little help with bathing/dressing/bathroom;Two people to help with bathing/dressing/bathroom;Assist for transportation;Help with stairs or ramp for entrance    Functional Status Assessment  Patient has had a recent decline in their functional status and demonstrates the ability to make significant improvements in function in a reasonable and predictable amount of time.  Equipment Recommendations  None recommended by OT    Recommendations for Other Services        Precautions / Restrictions Precautions Precautions: Fall Precaution Comments: Does not ambulate at baseline, Transfers only. Restrictions Weight Bearing Restrictions: No      Mobility Bed Mobility Overal bed mobility: Needs Assistance Bed Mobility: Supine to Sit, Sit to Supine     Supine to sit: Supervision          Transfers                   General transfer comment: not attempted; moved to EOB x 5 however transport chair not appropriate/safe to attempt transfer and recliner not a drop arm.       Balance Overall balance assessment: Needs assistance Sitting-balance support: Feet supported, No upper extremity supported Sitting balance-Leahy Scale: Fair                                     ADL either performed or assessed with clinical judgement   ADL Overall ADL's : Needs assistance/impaired Eating/Feeding: Modified independent   Grooming: Set up;Sitting   Upper Body Bathing: Set up;Supervision/ safety;Sitting   Lower Body Bathing: Minimal assistance;Bed level   Upper Body Dressing : Set up;Sitting   Lower Body Dressing: Moderate assistance;Bed level       Toileting- Clothing Manipulation and Hygiene: Maximal assistance       Functional mobility during ADLs:  (transfer to chair not attempted due set up) General ADL Comments: would benefit from AE for LB ADL Has tried sliding board in the past but does not like these - states her wc is not set up to safely complete  Vision Baseline Vision/History: 0 No visual deficits       Perception     Praxis      Pertinent Vitals/Pain       Hand Dominance Right   Extremity/Trunk Assessment Upper Extremity Assessment Upper Extremity Assessment: Generalized weakness   Lower Extremity Assessment Lower Extremity Assessment: Defer to PT evaluation   Cervical / Trunk Assessment Cervical / Trunk Assessment: Other exceptions (incresaed body habitus)   Communication  Communication Communication: No difficulties   Cognition Arousal/Alertness: Awake/alert Behavior During Therapy: WFL for tasks assessed/performed (schitzoaffective) Overall Cognitive Status: Within Functional Limits for tasks assessed (most likely baseline)                                       General Comments       Exercises Exercises: Other exercises Other Exercises Other Exercises: chair/bed push ups x 10   Shoulder Instructions      Home Living Family/patient expects to be discharged to:: Private residence Living Arrangements: Alone Available Help at Discharge: Personal care attendant;Available PRN/intermittently (4 hours/day 7 days/wk) Type of Home: Apartment Home Access: Stairs to enter Entrance Stairs-Number of Steps: 3; patient currently has EMS take her in and out if needed; has gotten approval for getting a ramp built Entrance Stairs-Rails: None Home Layout: One level     Bathroom Shower/Tub: Sponge bathes at baseline (Uses wipes)   Bathroom Toilet: Standard Bathroom Accessibility: Yes How Accessible: Accessible via walker Home Equipment: Rollator (4 wheels);BSC/3in1;Wheelchair - power;Wheelchair - manual   Additional Comments: Pt reports that she sleeps on the couch      Prior Functioning/Environment Prior Level of Function : Needs assist;History of Falls (last six months) (4-5 falls in last 6 months - EMS needs to assist with getting her back up)       Physical Assist : ADLs (physical)     Mobility Comments: Pt reports that she mobilizes herself from couch to power wheelchair and uses manual WC ad lib with LE's to keep up strength "when I'm feeling better". Pt reports 4-5 falls in last 6 months when transferring, requiring EMS to assist her in getting back up. ADLs Comments: Bathes/dresses herself @ bed level using wipes; states socks are difficult; wears gowns because they are easier; transfers on/off bed to change her depends herself,  including when she has a BM        OT Problem List: Decreased strength;Impaired balance (sitting and/or standing);Decreased activity tolerance;Decreased knowledge of use of DME or AE;Obesity      OT Treatment/Interventions: Self-care/ADL training;Therapeutic exercise;Energy conservation;DME and/or AE instruction;Therapeutic activities;Patient/family education;Balance training    OT Goals(Current goals can be found in the care plan section) Acute Rehab OT Goals Patient Stated Goal: to get better then go home OT Goal Formulation: With patient Time For Goal Achievement: 06/19/22 Potential to Achieve Goals: Good  OT Frequency: Min 2X/week    Co-evaluation              AM-PAC OT "6 Clicks" Daily Activity     Outcome Measure Help from another person eating meals?: None Help from another person taking care of personal grooming?: A Little Help from another person toileting, which includes using toliet, bedpan, or urinal?: A Lot Help from another person bathing (including washing, rinsing, drying)?: A Little Help from another person to put on and taking off regular upper body clothing?: A Little Help from another person to  put on and taking off regular lower body clothing?: A Lot 6 Click Score: 17   End of Session Equipment Utilized During Treatment: Gait belt Nurse Communication: Mobility status  Activity Tolerance: Patient tolerated treatment well Patient left: in bed;with call bell/phone within reach;with bed alarm set;with nursing/sitter in room  OT Visit Diagnosis: Other abnormalities of gait and mobility (R26.89);Muscle weakness (generalized) (M62.81)                Time: 1610-9604 OT Time Calculation (min): 33 min Charges:  OT General Charges $OT Visit: 1 Visit OT Evaluation $OT Eval Moderate Complexity: 1 Mod OT Treatments $Self Care/Home Management : 8-22 mins  Luisa Dago, OT/L   Acute OT Clinical Specialist Acute Rehabilitation Services Pager  [phone] Office [phone]   [facility] 06/05/2022, 2:37 PM

## 2022-06-05 NOTE — TOC Initial Note (Addendum)
Transition of Care [facility]) - Initial/Assessment Note    Patient Details  Name: Tiffany [name] MRN: [id number] Date of Birth: 09-Jul-1960  Transition of Care [facility]) CM/SW Contact:    Tom-Johnson, Hershal Coria, RN Phone Number: 06/05/2022, 3:28 PM  Clinical Narrative:                  CM spoke with patient at bedside about needs for post hospital transition. Admitted after a fall at home and found to have Acute on Chronic Renal failure. Nephrology consulted. Patient has bilateral lower extremity swelling and stage 2 decub to sacrum.  Patient has hx of Schizoaffective Disorder, COPD, Obstructive Sleep Apnea, DM, Hypertension, Hyperlipidemia, CAD, Cardiac Arrest, Anoxic Brain Injury and Cocaine use.    Patient is from home alone, has Nursing Aides seven days a week, could not remember name of Agency. Has three adult children who lives out of town. Mother and six siblings supportive. Currently on disability. Has a Motorized wheelchair, manual wheelchair, shower seat, and hospital bed at home. Patient requesting for [employer], Order placed and [employer] to deliver to patient's home. Patient's husband is listed but patient states he is deceased. Home health PT/OT recommended, patient active with [facility] for RN disciplines. CM called in PT/OT referral to [facility] with acceptance noted, info on AVS. PCP is Tiffany Kocher, DO and uses [facility] pharmacy on [address].  Patient requests for PTAR transportation at discharge. CM will continue to follow as patient progresses with care towards discharge.          Expected Discharge Plan: Home w Home Health Services Barriers to Discharge: Continued Medical Work up   Patient Goals and CMS Choice Patient states their goals for this hospitalization and ongoing recovery are:: To return home CMS Medicare.gov Compare Post Acute Care list provided to:: Patient Choice offered to / list presented to : Patient      Expected Discharge Plan and Services    Discharge Planning Services: CM Consult Post Acute Care Choice: Home Health Living arrangements for the past 2 months: Apartment                 DME Arranged: [employer] DME Agency: [employer] Date DME Agency Contacted: 06/05/22 Time DME Agency Contacted: [id number] Representative spoke with at DME Agency: [place] HH Arranged: PT, OT, RN (Active with RN discipline) HH Agency: [facility] Date HH Agency Contacted: 06/05/22 Time HH Agency Contacted: [phone] Representative spoke with at [facility] Agency: [facility]  Prior Living Arrangements/Services Living arrangements for the past 2 months: Apartment Lives with:: Self Patient language and need for interpreter reviewed:: Yes Do you feel safe going back to the place where you live?: Yes      Need for Family Participation in Patient Care: Yes (Comment) Care giver support system in place?: Yes (comment) Current home services: DME, Homehealth aide Criminal Activity/Legal Involvement Pertinent to Current Situation/Hospitalization: No - Comment as needed  Activities of Daily Living Home Assistive Devices/Equipment: Wheelchair (manual wheelchair) ADL Screening (condition at time of admission) Patient's cognitive ability adequate to safely complete daily activities?: Yes Is the patient deaf or have difficulty hearing?: No Does the patient have difficulty seeing, even when wearing glasses/contacts?: No Does the patient have difficulty concentrating, remembering, or making decisions?: No Patient able to express need for assistance with ADLs?: Yes Does the patient have difficulty dressing or bathing?: Yes Independently performs ADLs?: Yes (appropriate for developmental age) Does the patient have difficulty walking or climbing stairs?: Yes Weakness of Legs: Both Weakness of  Arms/Hands: Both  Permission Sought/Granted Permission sought to share information with : Case Manager, Magazine features editor, Family Supports Permission granted to share  information with : Yes, Verbal Permission Granted              Emotional Assessment Appearance:: Appears stated age Attitude/Demeanor/Rapport: Engaged, Gracious Affect (typically observed): Accepting, Appropriate, Calm, Hopeful, Pleasant Orientation: : Oriented to Self, Oriented to Place, Oriented to Situation Alcohol / Substance Use: Not Applicable Psych Involvement: No (comment)  Admission diagnosis:  Acute on chronic renal failure (HCC) [N17.9, N18.9] AKI (acute kidney injury) (HCC) [N17.9] Patient Active Problem List   Diagnosis Date Noted   Acute on chronic renal failure (HCC) 06/02/2022   Urinary incontinence 03/08/2022   Incontinence of feces 03/08/2022   Skin ulcer of sacrum, limited to breakdown of skin (HCC) 03/08/2022   Abnormal finding of lung 03/08/2022   Acute exacerbation of CHF (congestive heart failure) (HCC) 03/03/2022   Neck pain 02/28/2022   Acute on chronic diastolic heart failure (HCC) 02/04/2022   Dyspnea 02/03/2022   Encounter to establish care 01/05/2022   Malingering 10/19/2021   Subacute osteomyelitis of lumbar spine (HCC) 09/27/2021   Bipolar affective disorder, currently depressed, mild (HCC) 09/23/2021   Recurrent syncope 07/09/2021   Chronic leg pain 07/09/2021   Dehydration 07/09/2021   Delusional disorder, persecutory type (HCC) 06/26/2021   Rhabdomyolysis 06/16/2021   Normocytic anemia 06/16/2021   Chronic kidney disease, stage 3b (HCC) 06/06/2021   Anxiety and depression 05/25/2021   Physical deconditioning 05/25/2021   Cauda equina compression (HCC) 04/24/2021   Hematochezia    Hyperlipidemia 03/31/2021   GI bleed 03/30/2021   Acute GI bleeding 03/29/2021   Anemia in other chronic diseases classified elsewhere 01/16/2021   Coronary artery disease involving native coronary artery of native heart without angina pectoris 01/16/2021   Anxiety disorder, unspecified 01/16/2021   Personal history of nicotine dependence 01/16/2021    Atypical chest pain 01/13/2021   GERD (gastroesophageal reflux disease)    Neuropathy 11/18/2020   Pain in both feet 11/18/2020   Bilateral leg edema 11/18/2020   Polypharmacy 11/17/2020   Protein-calorie malnutrition, severe 05/15/2020   Malnutrition of moderate degree 05/13/2020   Periumbilical abdominal pain    Dysphagia    Nausea vomiting and diarrhea    Abdominal pain    E. coli UTI 02/20/2020   Acute metabolic encephalopathy 02/20/2020   Schizophrenia (HCC) 02/20/2020   Schizophrenia, schizo-affective (HCC) 02/20/2020   Metabolic acidosis, normal anion gap (NAG) 02/20/2020   AMS (altered mental status)    Hypocalcemia    Acute kidney injury superimposed on chronic kidney disease (HCC) 02/18/2020   Pelvic mass in female 01/02/2020   Genital herpes 11/25/2019   Acute renal failure superimposed on stage 3b chronic kidney disease (HCC) 10/27/2019   Family discord 02/04/2019   Aggressive behavior    PTSD (post-traumatic stress disorder) 05/27/2018   Schizoaffective disorder, bipolar type (HCC) 04/05/2018   Closed displaced fracture of fifth metacarpal bone 03/21/2018   Difficulty with speech 01/24/2018   Acute encephalopathy 11/21/2017   Drug abuse (HCC) 11/21/2017   Frequent falls 10/11/2017   Binge eating disorder    Dependence on continuous supplemental oxygen 05/14/2017   Gout 04/11/2017   Chronic diastolic congestive heart failure (HCC) 04/02/2017   Hypomagnesemia    Chronic kidney disease (CKD), stage IV (severe) (HCC) 12/15/2016   Carotid artery stenosis    Hypokalemia    Osteoarthritis 10/26/2016   Anoxic brain injury (HCC) 09/08/2016  Overactive bladder 06/07/2016   OSA and COPD overlap syndrome (HCC)    Arthritis    Essential hypertension 03/22/2016   Painless rectal bleeding 12/31/2015   Cocaine use disorder, severe, in sustained remission (HCC) 12/17/2015   History of drug abuse in remission (HCC) 11/28/2015   Hyponatremia 11/25/2015   CAP (community  acquired pneumonia) 06/22/2015   Tobacco use disorder 07/22/2014   COPD (chronic obstructive pulmonary disease) (HCC) 07/08/2014   Seizure (HCC) 01/04/2013   Chronic low back pain with sciatica 06/18/2012   Dyslipidemia 04/24/2011   Blood loss anemia 04/24/2011   Diabetic neuropathy (HCC) 04/24/2011   Morbid obesity (HCC) 10/18/2010   PCP:  Tiffany Kocher, DO Pharmacy:   [facility] [phone] Ginette Otto, [place] - [address] AT [address] [place] [address] Phone: [phone] Fax: [phone]     Social Determinants of Health (SDOH) Social History: SDOH Screenings   Food Insecurity: Patient Declined (02/05/2022)  Housing: Medium Risk (02/07/2022)  Transportation Needs: Unmet Transportation Needs (02/07/2022)  Utilities: Patient Declined (02/05/2022)  Alcohol Screen: Low Risk  (07/24/2021)  Depression (PHQ2-9): Medium Risk (04/03/2022)  Financial Resource Strain: Medium Risk (07/24/2021)  Physical Activity: Inactive (07/24/2021)  Social Connections: Moderately Isolated (07/24/2021)  Stress: Stress Concern Present (07/24/2021)  Tobacco Use: Medium Risk (06/02/2022)   SDOH Interventions: Transportation Interventions: Inpatient TOC, PTAR [facility])   Readmission Risk Interventions    06/05/2022    2:47 PM 02/07/2022    2:22 PM 05/24/2021    6:19 PM  Readmission Risk Prevention Plan  Transportation Screening Complete Complete Complete  Medication Review (RN Care Manager) Referral to Pharmacy Complete Referral to Pharmacy  PCP or Specialist appointment within 3-5 days of discharge Complete Complete Complete  HRI or Home Care Consult Complete Complete Complete  SW Recovery Care/Counseling Consult Complete Complete Complete  Palliative Care Screening Not Applicable Not Applicable   Skilled Nursing Facility Not Applicable Not Applicable Complete

## 2022-06-05 NOTE — Care Management Important Message (Signed)
Important Message  Patient Details  Name: Tiffany [name] MRN: [id number] Date of Birth: [birth year]   Medicare Important Message Given:  Yes     Dorena Bodo 06/05/2022, 2:47 PM

## 2022-06-05 NOTE — Progress Notes (Cosign Needed)
    Durable Medical Equipment  (From admission, onward)           Start     Ordered   06/05/22 1514  For home use only DME [employer]  Once       Question:  Length of Need  Answer:  12 Months   06/05/22 1514

## 2022-06-06 ENCOUNTER — Inpatient Hospital Stay (HOSPITAL_COMMUNITY): Payer: 59

## 2022-06-06 DIAGNOSIS — I5031 Acute diastolic (congestive) heart failure: Secondary | ICD-10-CM

## 2022-06-06 DIAGNOSIS — N179 Acute kidney failure, unspecified: Secondary | ICD-10-CM | POA: Diagnosis not present

## 2022-06-06 DIAGNOSIS — N184 Chronic kidney disease, stage 4 (severe): Secondary | ICD-10-CM | POA: Diagnosis not present

## 2022-06-06 LAB — ECHOCARDIOGRAM COMPLETE
AR max vel: 2.18 cm2
AV Area VTI: 2.24 cm2
AV Area mean vel: 2.3 cm2
AV Mean grad: 10 mmHg
AV Peak grad: 20.7 mmHg
Ao pk vel: 2.28 m/s
Area-P 1/2: 2.71 cm2
Calc EF: 60.8 %
Height: 64 in
MV VTI: 2.82 cm2
S' Lateral: 3.5 cm
Single Plane A2C EF: 60.7 %
Single Plane A4C EF: 61.8 %
Weight: 3350.99 oz

## 2022-06-06 LAB — CBC WITH DIFFERENTIAL/PLATELET
Abs Immature Granulocytes: 0.01 10*3/uL (ref 0.00–0.07)
Basophils Absolute: 0 10*3/uL (ref 0.0–0.1)
Basophils Relative: 1 %
Eosinophils Absolute: 0.2 10*3/uL (ref 0.0–0.5)
Eosinophils Relative: 6 %
HCT: 28.1 % — ABNORMAL LOW (ref 36.0–46.0)
Hemoglobin: 8.4 g/dL — ABNORMAL LOW (ref 12.0–15.0)
Immature Granulocytes: 0 %
Lymphocytes Relative: 43 %
Lymphs Abs: 1.3 10*3/uL (ref 0.7–4.0)
MCH: 26.8 pg (ref 26.0–34.0)
MCHC: 29.9 g/dL — ABNORMAL LOW (ref 30.0–36.0)
MCV: 89.8 fL (ref 80.0–100.0)
Monocytes Absolute: 0.3 10*3/uL (ref 0.1–1.0)
Monocytes Relative: 11 %
Neutro Abs: 1.2 10*3/uL — ABNORMAL LOW (ref 1.7–7.7)
Neutrophils Relative %: 39 %
Platelets: 199 10*3/uL (ref 150–400)
RBC: 3.13 MIL/uL — ABNORMAL LOW (ref 3.87–5.11)
RDW: 17.1 % — ABNORMAL HIGH (ref 11.5–15.5)
WBC: 3 10*3/uL — ABNORMAL LOW (ref 4.0–10.5)
nRBC: 0 % (ref 0.0–0.2)

## 2022-06-06 LAB — BASIC METABOLIC PANEL
Anion gap: 8 (ref 5–15)
BUN: 54 mg/dL — ABNORMAL HIGH (ref 8–23)
CO2: 22 mmol/L (ref 22–32)
Calcium: 8.5 mg/dL — ABNORMAL LOW (ref 8.9–10.3)
Chloride: 109 mmol/L (ref 98–111)
Creatinine, Ser: 4.17 mg/dL — ABNORMAL HIGH (ref 0.44–1.00)
GFR, Estimated: 11 mL/min — ABNORMAL LOW (ref >60)
Glucose, Bld: 85 mg/dL (ref 70–99)
Potassium: 3.9 mmol/L (ref 3.5–5.1)
Sodium: 139 mmol/L (ref 135–145)

## 2022-06-06 LAB — GLOMERULAR BASEMENT MEMBRANE ANTIBODIES: GBM Ab: <0.2 units (ref 0.0–0.9)

## 2022-06-06 LAB — ANA W/REFLEX IF POSITIVE: Anti Nuclear Antibody (ANA): NEGATIVE

## 2022-06-06 MED ORDER — FUROSEMIDE 10 MG/ML IJ SOLN: 80.0000 mg | Freq: Once | INTRAMUSCULAR | Status: AC

## 2022-06-06 MED ORDER — ESCITALOPRAM OXALATE 20 MG PO TABS
20.0000 mg | ORAL_TABLET | Freq: Every day | ORAL | Status: DC
  Administered 2022-06-06 – 2022-06-08 (×3): 20 mg via ORAL
  Filled 2022-06-06 (×3): qty 1

## 2022-06-06 MED ORDER — DARBEPOETIN ALFA 60 MCG/0.3ML IJ SOSY
60.0000 ug | PREFILLED_SYRINGE | Freq: Once | INTRAMUSCULAR | Status: AC
  Administered 2022-06-06: 60 ug via SUBCUTANEOUS
  Filled 2022-06-06: qty 0.3

## 2022-06-06 MED ORDER — FUROSEMIDE 10 MG/ML IJ SOLN
80.0000 mg | Freq: Once | INTRAMUSCULAR | Status: AC
  Administered 2022-06-06: 80 mg via INTRAVENOUS
  Filled 2022-06-06: qty 8

## 2022-06-06 NOTE — Progress Notes (Signed)
Patients [facility] APS worker contacted the hospital looking for patient. Tiffany [name], [phone]

## 2022-06-06 NOTE — Progress Notes (Signed)
Physical Therapy Treatment Patient Details Name: Tiffany [name] MRN: [id number] DOB: 02/28/1960 Today's Date: 06/06/2022   History of Present Illness Tiffany [name] is a 62 y.o. female who, on 5/17, patient lost her balance, fell to the floor, was not able to get up and was later found on the floor by home health nurse;  with PMH significant for obesity, OSA, DM2, HTN, HLD, CAD, CHF, bilateral lower extremity edema on diuretics, CKD, carotid artery disease, h/o cardiac arrest and subsequent anoxic brain injury, history of drug abuse, COPD, anxiety/depression, schizoaffective disorder, GERD.  Patient lives alone, has impaired mobility, uses a motorized scooter    PT Comments    Pt greeted resting in bed and agreeable to session with continued progress towards acute goals with session focused on standing tolerance as pt expressing desire to work towards being able to step single LE forward during transfers.  Pt able to come to stand in stedy frame x4 bouts and complete pre-gait activities in standing during each with concentration on stepping LLE as pt normally transfers toward L and would need to step LLE. Pt able to clear LLE with light assist and cues for technique. Pt pleasant throughout session and with good participation.Pt continues to benefit from skilled PT services to progress toward functional mobility goals.    Recommendations for follow up therapy are one component of a multi-disciplinary discharge planning process, led by the attending physician.  Recommendations may be updated based on patient status, additional functional criteria and insurance authorization.  Follow Up Recommendations       Assistance Recommended at Discharge Intermittent Supervision/Assistance  Patient can return home with the following A lot of help with walking and/or transfers;Assistance with cooking/housework;Assist for transportation;Help with stairs or ramp for entrance   Equipment Recommendations    (trapeze bar for pt's hospital bed; highly recommend installing a ramp)    Recommendations for Other Services       Precautions / Restrictions Precautions Precautions: Fall Precaution Comments: Does not ambulate at baseline, Transfers only. Restrictions Weight Bearing Restrictions: No     Mobility  Bed Mobility Overal bed mobility: Needs Assistance Bed Mobility: Supine to Sit, Sit to Supine     Supine to sit: Min assist Sit to supine: Min guard   General bed mobility comments: Light assist and use of rails for transition to sitting. Increased time to scoot out and get feet on the floor. pt able to self mobilize LEs to and on bed and reposition    Transfers Overall transfer level: Needs assistance Equipment used: Ambulation equipment used Transfers: Sit to/from Stand Sit to Stand: Mod assist, Min guard, Min assist, Max assist           General transfer comment: mod A to initially come to stand from EOB in stedy frame, min A down to min guard to stand from stedy pads, max A to stand from EOB without AD Transfer via Lift Equipment: Stedy  Ambulation/Gait             Pre-gait activities: in standing, weight shifting R/L during first bout of standing, alternating marching R/L on second bout, marching LLE only with RLE blocked on third bout     Stairs             Wheelchair Mobility    Modified Rankin (Stroke Patients Only)       Balance Overall balance assessment: Needs assistance Sitting-balance support: Feet supported, No upper extremity supported Sitting balance-Leahy Scale: Fair  Standing balance support: Bilateral upper extremity supported, During functional activity, Reliant on assistive device for balance Standing balance-Leahy Scale: Poor Standing balance comment: heavy reliance on UE supprot                            Cognition Arousal/Alertness: Awake/alert Behavior During Therapy: WFL for tasks assessed/performed  (schitzoaffective) Overall Cognitive Status: Within Functional Limits for tasks assessed (most likely baseline)                                          Exercises General Exercises - Lower Extremity Long Arc Quad: AROM, Right, Left, 10 reps, Seated Hip Flexion/Marching: AROM, Right, Left, 10 reps, Seated Toe Raises: AAROM, Right, Left, 10 reps Heel Raises: AAROM, Right, Left, 10 reps, Seated    General Comments General comments (skin integrity, edema, etc.): pleasant throughout session      Pertinent Vitals/Pain Pain Assessment Pain Assessment: No/denies pain    Home Living                          Prior Function            PT Goals (current goals can now be found in the care plan section) Acute Rehab PT Goals Patient Stated Goal: To be able to stand longer and take steps PT Goal Formulation: With patient Time For Goal Achievement: 06/18/22 Progress towards PT goals: Progressing toward goals    Frequency    Min 3X/week      PT Plan Current plan remains appropriate    Co-evaluation              AM-PAC PT "6 Clicks" Mobility   Outcome Measure  Help needed turning from your back to your side while in a flat bed without using bedrails?: A Little Help needed moving from lying on your back to sitting on the side of a flat bed without using bedrails?: A Little Help needed moving to and from a bed to a chair (including a wheelchair)?: A Lot Help needed standing up from a chair using your arms (e.g., wheelchair or bedside chair)?: A Lot Help needed to walk in hospital room?: Total Help needed climbing 3-5 steps with a railing? : Total 6 Click Score: 12    End of Session Equipment Utilized During Treatment: Gait belt Activity Tolerance: Patient tolerated treatment well Patient left: in chair;with call bell/phone within reach;with chair alarm set Nurse Communication: Mobility status PT Visit Diagnosis: Other abnormalities of gait and  mobility (R26.89)     Time: 4098-1191 PT Time Calculation (min) (ACUTE ONLY): 27 min  Charges:  $Therapeutic Exercise: 8-22 mins $Therapeutic Activity: 8-22 mins                     Ruthetta Koopmann R. PTA Acute Rehabilitation Services Office: [phone]   Catalina Antigua 06/06/2022, 3:54 PM

## 2022-06-06 NOTE — TOC Progression Note (Signed)
Transition of Care [facility]) - Initial/Assessment Note    Patient Details  Name: Tiffany [name] MRN: [id number] Date of Birth: [birth year]  Transition of Care [facility]) CM/SW Contact:    Ralene Bathe, LCSWA Phone Number: 06/06/2022, 1:31 PM  Clinical Narrative:                 LCSW notified that patient's [facility] APS worker Andrena Mews, [phone] has requested to meet with patient.  APS worker has release of information signed by patient to communicate with hospital.  APS worker will meet with patient later today.  TOC following.    Expected Discharge Plan: Home w Home Health Services Barriers to Discharge: Continued Medical Work up   Patient Goals and CMS Choice Patient states their goals for this hospitalization and ongoing recovery are:: To return home CMS Medicare.gov Compare Post Acute Care list provided to:: Patient Choice offered to / list presented to : Patient      Expected Discharge Plan and Services   Discharge Planning Services: CM Consult Post Acute Care Choice: Home Health Living arrangements for the past 2 months: Apartment                 DME Arranged: [place] DME Agency: [employer] Date DME Agency Contacted: 06/05/22 Time DME Agency Contacted: [id number] Representative spoke with at DME Agency: [facility] HH Arranged: PT, OT, RN (Active with RN discipline) HH Agency: [facility] Date HH Agency Contacted: 06/05/22 Time HH Agency Contacted: [phone] Representative spoke with at [facility] Agency: [facility]  Prior Living Arrangements/Services Living arrangements for the past 2 months: Apartment Lives with:: Self Patient language and need for interpreter reviewed:: Yes Do you feel safe going back to the place where you live?: Yes      Need for Family Participation in Patient Care: Yes (Comment) Care giver support system in place?: Yes (comment) Current home services: DME, Homehealth aide Criminal Activity/Legal Involvement Pertinent to Current  Situation/Hospitalization: No - Comment as needed  Activities of Daily Living Home Assistive Devices/Equipment: Wheelchair (manual wheelchair) ADL Screening (condition at time of admission) Patient's cognitive ability adequate to safely complete daily activities?: Yes Is the patient deaf or have difficulty hearing?: No Does the patient have difficulty seeing, even when wearing glasses/contacts?: No Does the patient have difficulty concentrating, remembering, or making decisions?: No Patient able to express need for assistance with ADLs?: Yes Does the patient have difficulty dressing or bathing?: Yes Independently performs ADLs?: Yes (appropriate for developmental age) Does the patient have difficulty walking or climbing stairs?: Yes Weakness of Legs: Both Weakness of Arms/Hands: Both  Permission Sought/Granted Permission sought to share information with : Case Manager, Magazine features editor, Family Supports Permission granted to share information with : Yes, Verbal Permission Granted              Emotional Assessment Appearance:: Appears stated age Attitude/Demeanor/Rapport: Engaged, Gracious Affect (typically observed): Accepting, Appropriate, Calm, Hopeful, Pleasant Orientation: : Oriented to Self, Oriented to Place, Oriented to Situation Alcohol / Substance Use: Not Applicable Psych Involvement: No (comment)  Admission diagnosis:  Acute on chronic renal failure (HCC) [N17.9, N18.9] AKI (acute kidney injury) (HCC) [N17.9] Patient Active Problem List   Diagnosis Date Noted   Acute on chronic renal failure (HCC) 06/02/2022   Urinary incontinence 03/08/2022   Incontinence of feces 03/08/2022   Skin ulcer of sacrum, limited to breakdown of skin (HCC) 03/08/2022   Abnormal finding of lung 03/08/2022   Acute exacerbation of CHF (congestive heart failure) (HCC)  03/03/2022   Neck pain 02/28/2022   Acute on chronic diastolic heart failure (HCC) 02/04/2022   Dyspnea  02/03/2022   Encounter to establish care 01/05/2022   Malingering 10/19/2021   Subacute osteomyelitis of lumbar spine (HCC) 09/27/2021   Bipolar affective disorder, currently depressed, mild (HCC) 09/23/2021   Recurrent syncope 07/09/2021   Chronic leg pain 07/09/2021   Dehydration 07/09/2021   Delusional disorder, persecutory type (HCC) 06/26/2021   Rhabdomyolysis 06/16/2021   Normocytic anemia 06/16/2021   Chronic kidney disease, stage 3b (HCC) 06/06/2021   Anxiety and depression 05/25/2021   Physical deconditioning 05/25/2021   Cauda equina compression (HCC) 04/24/2021   Hematochezia    Hyperlipidemia 03/31/2021   GI bleed 03/30/2021   Acute GI bleeding 03/29/2021   Anemia in other chronic diseases classified elsewhere 01/16/2021   Coronary artery disease involving native coronary artery of native heart without angina pectoris 01/16/2021   Anxiety disorder, unspecified 01/16/2021   Personal history of nicotine dependence 01/16/2021   Atypical chest pain 01/13/2021   GERD (gastroesophageal reflux disease)    Neuropathy 11/18/2020   Pain in both feet 11/18/2020   Bilateral leg edema 11/18/2020   Polypharmacy 11/17/2020   Protein-calorie malnutrition, severe 05/15/2020   Malnutrition of moderate degree 05/13/2020   Periumbilical abdominal pain    Dysphagia    Nausea vomiting and diarrhea    Abdominal pain    E. coli UTI 02/20/2020   Acute metabolic encephalopathy 02/20/2020   Schizophrenia (HCC) 02/20/2020   Schizophrenia, schizo-affective (HCC) 02/20/2020   Metabolic acidosis, normal anion gap (NAG) 02/20/2020   AMS (altered mental status)    Hypocalcemia    Acute kidney injury superimposed on chronic kidney disease (HCC) 02/18/2020   Pelvic mass in female 01/02/2020   Genital herpes 11/25/2019   Acute renal failure superimposed on stage 3b chronic kidney disease (HCC) 10/27/2019   Family discord 02/04/2019   Aggressive behavior    PTSD (post-traumatic stress  disorder) 05/27/2018   Schizoaffective disorder, bipolar type (HCC) 04/05/2018   Closed displaced fracture of fifth metacarpal bone 03/21/2018   Difficulty with speech 01/24/2018   Acute encephalopathy 11/21/2017   Drug abuse (HCC) 11/21/2017   Frequent falls 10/11/2017   Binge eating disorder    Dependence on continuous supplemental oxygen 05/14/2017   Gout 04/11/2017   Chronic diastolic congestive heart failure (HCC) 04/02/2017   Hypomagnesemia    Chronic kidney disease (CKD), stage IV (severe) (HCC) 12/15/2016   Carotid artery stenosis    Hypokalemia    Osteoarthritis 10/26/2016   Anoxic brain injury (HCC) 09/08/2016   Overactive bladder 06/07/2016   OSA and COPD overlap syndrome (HCC)    Arthritis    Essential hypertension 03/22/2016   Painless rectal bleeding 12/31/2015   Cocaine use disorder, severe, in sustained remission (HCC) 12/17/2015   History of drug abuse in remission (HCC) 11/28/2015   Hyponatremia 11/25/2015   CAP (community acquired pneumonia) 06/22/2015   Tobacco use disorder 07/22/2014   COPD (chronic obstructive pulmonary disease) (HCC) 07/08/2014   Seizure (HCC) 01/04/2013   Chronic low back pain with sciatica 06/18/2012   Dyslipidemia 04/24/2011   Blood loss anemia 04/24/2011   Diabetic neuropathy (HCC) 04/24/2011   Morbid obesity (HCC) 10/18/2010   PCP:  Tiffany Kocher, DO Pharmacy:   [facility] [phone] Gin[place] Otto, [place] - [address] AT [address] [place] [address] Phone: [phone] Fax: [phone]     Social Determinants of Health (SDOH) Social History: SDOH Screenings  Food Insecurity: Patient Declined (02/05/2022)  Housing: Medium Risk (02/07/2022)  Transportation Needs: Unmet Transportation Needs (02/07/2022)  Utilities: Patient Declined (02/05/2022)  Alcohol Screen: Low Risk  (07/24/2021)  Depression (PHQ2-9): Medium Risk (04/03/2022)  Financial Resource Strain: Medium Risk (07/24/2021)  Physical  Activity: Inactive (07/24/2021)  Social Connections: Moderately Isolated (07/24/2021)  Stress: Stress Concern Present (07/24/2021)  Tobacco Use: Medium Risk (06/02/2022)   SDOH Interventions: Transportation Interventions: Inpatient TOC, PTAR [facility])   Readmission Risk Interventions    06/05/2022    2:47 PM 02/07/2022    2:22 PM 05/24/2021    6:19 PM  Readmission Risk Prevention Plan  Transportation Screening Complete Complete Complete  Medication Review (RN Care Manager) Referral to Pharmacy Complete Referral to Pharmacy  PCP or Specialist appointment within 3-5 days of discharge Complete Complete Complete  HRI or Home Care Consult Complete Complete Complete  SW Recovery Care/Counseling Consult Complete Complete Complete  Palliative Care Screening Not Applicable Not Applicable   Skilled Nursing Facility Not Applicable Not Applicable Complete

## 2022-06-06 NOTE — Progress Notes (Addendum)
Tiffany [name] Progress Note   62 y.o. female with CKD, schizoaffective disorder, COPD, morbid obesity, obstructive sleep apnea, type II diabetes mellitus, HTN, HLD, CASHD, h/o cardiac arrest and anoxic brain jury, history of cocaine use (last use ~2 years ago) admitted to [facility] 5/17 with acute on chronic renal failure. Prior to admission patient reports she was feeling generally weak, not eating or drinking at her baseline. Patient had a reported mechanical fall and was found on the ground by her therapist, who called 911. She states she was only on the ground for 10-20 minutes. On arrival sCr 4.73. She was given intravenous fluids with mild improvement of her renal function, this has now stabilized out with sCr 4.13 and nephrology consulted for further assistance.     Assessment/ Plan:   Oliguric acute kidney injury in setting of chronic renal disease stage IV: Overall, her renal function has been worsening steadily over the ; of note she was in the 1.2-1.8 range as recently as 09/19/21 and then now consistently in the m2.6-3 range earlier 2024 with progression into the 4's in 05/2022. [name] Patient chronically uses diuretics for her lower extremity edema, although notes as of late the diuretics have not been working as well. She denies any recent fevers, but reports she is consistently chilled. Denies any nausea, vomiting, cough, hemoptysis, abdominal pain, constipation, diarrhea, dysuria, change in urine color, recent night sweats, weight loss, IV drug use. Patient last used cocaine roughly two years ago and has not been sexually active in the last six years.   Renal function initially responded to IV fluids, but I do not think she would benefit from further fluids given she is showing signs of hypervolemia on exam. Initial UA with significant proteinuria, hemoglobinuria without casts noted. Albumin low-normal at 3.5, calcium low-normal at 8.9.   - Urine sediment on my exam did not show  RBC's; regardless she has had fairly rapid decline in renal function will send serologies, quantitate degree of proteinuria. Ordinarily would consider biopsy the LK (may not have much function in the RK which is only 7.4 cm in length) but she has already engaged in talks with palliative who were supposed to speak with her in another 3 weeks. I am also hesitant to biopsy the LK as the RK is likely either nonfunctional or minimal function. If there are complications with the biopsy (and she is obese making her higher risk) then she would basically be dialysis dependent.  - I discussed options with dialysis as she is progressing rapidly and she really doesn't want dialysis; she's also not a great candidate for dialysis given age, comorbidities, functional status. [name] wheelchair bound, OSA, DM in the past, CASHD, h/o cardiac arrest, COPD.  - Would repeat TTE (last one 01/2022) and with her brisk response to Lasix challenge may be in CRS -> will give another dose of Lasix today.   Chronic anemia: Appears to be at baseline, no signs or symptoms of acute bleeding. Likely 2/2 renal disease. Will dose with ESA. WBC and temps don't suggest overwhelming infection but repeat urinalysis suggests possible UTI. [name] asymp.   COPD/OSA: On bronchodilators, sating well on room air currently.  Chronic diastolic heart failure: Gave a dose of Lasix on 5/20 and appears she had a very good response. CAD with history of cardiac arrest: Per primary Type II diabetes mellitus: Sugars at goal, last A1c at goal.     Subjective:   Just tired but denies any worsening of SOB;  denies CP/ f/c/n/v/ dysuria.   Objective:   BP 118/73 (BP Location: Left Arm)   Pulse (!) 56   Temp 97.8 F (36.6 C) (Oral)   Resp 16   Ht 5\' 4"  (1.626 m)   Wt 95 kg   SpO2 93%   BMI 35.95 kg/m   Intake/Output Summary (Last 24 hours) at 06/06/2022 0726 Last data filed at 06/06/2022 1610 Gross per 24 hour  Intake 360 ml  Output 2850 ml  Net  -2490 ml   Weight change:   Physical Exam: General: Chronically ill-appearing person  HEENT: NCAT Eyes: Vision grossly intact. Heart: Regular rate, rhythm. No murmurs. Warm extremities.  Lungs: Mild rales bilateral bases. Mild end-expiratory wheezing  Abdomen: SNDNT+BS Extremities: Pitting edema to hips bilaterally.  Skin: Warm, dry. No rashes, lesions, petechiae appreciated.  Neuro: Awake, alert, conversing appropriately. Grossly non-focal.   Imaging: US RENAL  Result Date: 06/05/2022 CLINICAL DATA:  Acute kidney injury. EXAM: RENAL / URINARY TRACT ULTRASOUND COMPLETE COMPARISON:  02/04/2022 FINDINGS: Right Kidney: Renal measurements: 7.4 x 4.3 x 4.3 centimeters = volume: 72 ML. Hyperechoic renal parenchyma. No mass or hydronephrosis. Left Kidney: Renal measurements: 10.5 x 4.5 x 3.8 centimeters = volume: 94 mL. Hyperechoic renal parenchyma. No mass or hydronephrosis. Bladder: Appears normal for degree of bladder distention. Other: None. IMPRESSION: 1. Hyperechoic renal parenchyma, consistent with history of renal disease. 2. No hydronephrosis or mass. Electronically Signed   By: Norva Pavlov M.D.   On: 06/05/2022 17:31   VAS [name] LOWER EXTREMITY VENOUS (DVT)  Result Date: 06/05/2022  Lower Venous DVT Study Patient Name:  Tiffany [name] [facility]  Date of Exam:   06/04/2022 Medical Rec #: [id number]        Accession #:    [id number] Date of Birth: 12/06/1960        Patient Gender: F Patient Age:   51 years Exam Location:  [facility] Procedure:      VAS [name] LOWER EXTREMITY VENOUS (DVT) Referring Phys: Lorin Glass --------------------------------------------------------------------------------  Indications: Swelling.  Comparison Study: 06-08-2021 Prior bilateral lower extremity venous study was                   negative for DVT. Performing Technologist: Jean Rosenthal RDMS, RVT  Examination Guidelines: A complete evaluation includes B-mode imaging, spectral Doppler, color Doppler, and power  Doppler as needed of all accessible portions of each vessel. Bilateral testing is considered an integral part of a complete examination. Limited examinations for reoccurring indications may be performed as noted. The reflux portion of the exam is performed with the patient in reverse Trendelenburg.  +---------+---------------+---------+-----------+----------+--------------+ RIGHT    CompressibilityPhasicitySpontaneityPropertiesThrombus Aging +---------+---------------+---------+-----------+----------+--------------+ CFV      Full           Yes      Yes                                 +---------+---------------+---------+-----------+----------+--------------+ SFJ      Full                                                        +---------+---------------+---------+-----------+----------+--------------+ FV Prox  Full                                                        +---------+---------------+---------+-----------+----------+--------------+  FV Mid   Full                                                        +---------+---------------+---------+-----------+----------+--------------+ FV DistalFull                                                        +---------+---------------+---------+-----------+----------+--------------+ PFV      Full                                                        +---------+---------------+---------+-----------+----------+--------------+ POP      Full           Yes      Yes                                 +---------+---------------+---------+-----------+----------+--------------+ PTV      Full                                                        +---------+---------------+---------+-----------+----------+--------------+ PERO     Full                                                        +---------+---------------+---------+-----------+----------+--------------+    +---------+---------------+---------+-----------+----------+--------------+ LEFT     CompressibilityPhasicitySpontaneityPropertiesThrombus Aging +---------+---------------+---------+-----------+----------+--------------+ CFV      Full           Yes      Yes                                 +---------+---------------+---------+-----------+----------+--------------+ SFJ      Full                                                        +---------+---------------+---------+-----------+----------+--------------+ FV Prox  Full                                                        +---------+---------------+---------+-----------+----------+--------------+ FV Mid   Full                                                        +---------+---------------+---------+-----------+----------+--------------+  FV DistalFull                                                        +---------+---------------+---------+-----------+----------+--------------+ PFV      Full                                                        +---------+---------------+---------+-----------+----------+--------------+ POP      Full           Yes      Yes                                 +---------+---------------+---------+-----------+----------+--------------+ PTV      Full                                                        +---------+---------------+---------+-----------+----------+--------------+ PERO     Full                                                        +---------+---------------+---------+-----------+----------+--------------+     Summary: RIGHT: - There is no evidence of deep vein thrombosis in the lower extremity.  - No cystic structure found in the popliteal fossa.  LEFT: - There is no evidence of deep vein thrombosis in the lower extremity.  - No cystic structure found in the popliteal fossa.  *See table(s) above for measurements and observations. Electronically signed  by Sherald Hess MD on 06/05/2022 at 9:18:30 AM.    Final     Labs: BMET Recent Labs  Lab 06/02/22 1700 06/02/22 1843 06/03/22 0220 06/04/22 0206 06/05/22 0153  NA 139 141 140 140 136  K 4.9 5.0 4.2 4.7 4.2  CL 107  --  110 112* 109  CO2 22  --  21* 21* 20*  GLUCOSE 83  --  93 90 89  BUN 48*  --  48* 54* 54*  CREATININE 4.73*  --  4.51* 4.55* 4.13*  CALCIUM 8.9  --  8.3* 8.1* 8.4*   CBC Recent Labs  Lab 06/02/22 1700 06/02/22 1843 06/03/22 0220 06/04/22 0206 06/05/22 0153  WBC 4.6  --  3.9* 3.3* 3.6*  NEUTROABS 3.1  --   --  1.7 1.8  HGB 8.3* 10.5* 7.8* 8.0* 8.4*  HCT 27.7* 31.0* 25.8* 26.2* 27.9*  MCV 90.8  --  88.7 90.7 90.6  PLT 204  --  209 206 200    Medications:     amLODipine  10 mg Oral Daily   atorvastatin  20 mg Oral Daily   carvedilol  12.5 mg Oral BID WC   vitamin B-12  1,000 mcg Oral Daily   fluticasone furoate-vilanterol  1 puff Inhalation Daily   And   umeclidinium bromide  1 puff Inhalation Daily  folic acid  1 mg Oral Daily   heparin  5,000 Units Subcutaneous Q8H   lamoTRIgine  100 mg Oral BID   melatonin  3 mg Oral QHS   montelukast  10 mg Oral QHS   OLANZapine  5 mg Oral QHS   prazosin  2 mg Oral QHS   pregabalin  50 mg Oral TID      Paulene Floor, MD 06/06/2022, 7:26 AM

## 2022-06-06 NOTE — Progress Notes (Signed)
PROGRESS NOTE  Tiffany [name]  DOB: 09/04/1960  PCP: Tiffany Kocher, DO WGN:[id number]  DOA: 06/02/2022  LOS: 4 days  Hospital Day: 5  Brief narrative: Tiffany [name] is a 62 y.o. female with PMH significant for obesity, OSA, DM2, HTN, HLD, CAD, CHF, bilateral lower extremity edema on diuretics, CKD, carotid artery disease, h/o cardiac arrest and subsequent anoxic brain injury, history of drug abuse, COPD, anxiety/depression, schizoaffective disorder, GERD.  Patient lives alone, has impaired mobility, uses a motorized scooter  5/17, patient lost her balance, fell to the floor, was not able to get up and was later found on the floor by home health nurse.  EMS noted that she was lethargic and hypoxic, started on 4 L oxygen and brought to the ED.  In the ED, patient was afebrile, heart rate 58, blood pressure 130s, breathing on room air She was noted to have severe swelling and tenderness in both her legs. Labs showed hemoglobin low at 8.3, creatinine elevated 4.73. Urinalysis with clear yellow urine, moderate hemoglobin, negative leukocytes, rare bacteria CT head did not show any evidence of acute intracranial malady.  It showed atrophy and chronic small vessel ischemic disease.   Chest x-ray showed cardiomegaly with mild central congestion, possible patchy right mid and lower lung airspace disease Hip x-ray negative for fracture. Admitted to [facility]  Subjective: Patient was seen and examined this morning.  Propped up in bed.  Not in distress.  Alert, awake.  We discussed about her psych meds and she wants all of home resumed.  [name] not able to name all of them for me but definitely wants all of them resumed.   Nephrology following for renal impairment.  Assessment and plan: AKI on CKD 4 Proteinuria Baseline creatinine seems to be less than 3 until February 2024 after which she seems to have progressive worsening renal function.  Most recent creatinine was 4.19 two weeks ago.  On  diuretics at home for chronic lymphedema.   Presented with creatinine elevated to 4.73.  Urine protein more than 300 Most recent renal ultrasound from January 2024 with chronic medical renal disease and no obstruction. Currently diuretics on hold.   5/20, nephrology consult was obtained.  1 dose of IV Lasix was given.  Brisk diuresis noted.   5/21, noted plan for nephrology to give 1 more dose of Lasix today. Creatinine trend as below. Recent Labs    03/04/22 0605 03/05/22 0339 03/06/22 0524 05/05/22 1315 05/19/22 1408 06/02/22 1700 06/03/22 0220 06/04/22 0206 06/05/22 0153 06/06/22 0757  BUN 56* 61* 64* 50* 49* 48* 48* 54* 54* 54*  CREATININE 3.27* 2.90* 2.73* 3.65* 4.19* 4.73* 4.51* 4.55* 4.13* 4.17*    Acute metabolic encephalopathy Schizoaffective disorder Anxiety/depression For the first 24 hours, patient was somnolent.  Suspect due to uremia as well as the effect of morning psych meds.  Mental status gradually improved. PTA on multiple mood altering medications including Cymbalta 30 mg daily, Lexapro 20 mg daily, Lamictal 100 mg twice daily, Zyprexa 5 mg at bedtime, Lyrica 100 mg 3 times daily, Flexeril 10 mg 3 times daily as needed, Saphris 5 mg twice daily. Currently continued on Lamictal, Zyprexa, Lyrica and Flexeril.  Cymbalta, Lexapro and safaris on hold.  Mental status improved. Patient wants all of them resumed.  I would resume her Lexapro today.  I will still keep Cymbalta and safaris on hold because of drug interaction alerts with other psych meds.   Chronic anemia Hemoglobin at baseline between 8 and  9.  Likely has anemia of chronic disease.  No active bleeding.  Continue to monitor. Recent Labs    06/02/22 1843 06/03/22 0220 06/04/22 0206 06/05/22 0153 06/06/22 0757  HGB 10.5* 7.8* 8.0* 8.4* 8.4*  MCV  --  88.7 90.7 90.6 89.8  VITAMINB12  --   --  301  --   --   FOLATE  --   --  4.1*  --   --   FERRITIN  --   --  30  --   --   TIBC  --   --  287  --   --    IRON  --   --  61  --   --   RETICCTPCT  --   --  1.7  --   --     Abnormal chest x-ray COPD Chest x-ray showed mild congestion, patchy right sided airspace disease. No fever.  WBC count not elevated.  Procalcitonin level low. Given empiric IV Rocephin in the ED. No further active given.  Remains stable.  Continue bronchodilators Not requiring supplemental oxygen Recent Labs  Lab 06/02/22 1700 06/03/22 0220 06/04/22 0206 06/05/22 0153 06/06/22 0757  WBC 4.6 3.9* 3.3* 3.6* 3.0*  PROCALCITON  --  <0.10  --   --   --     CHF/HTN Bilateral lower extremity edema PTA on carvedilol 12.5 mg twice daily, amlodipine 5 mg twice daily, prazosin 2 mg at bedtime, torsemide 20 mg daily. Continue Coreg, amlodipine, prazosin. Torsemide on hold because of AKI.   Ultrasound duplex scan of both legs negative for DVT.  TED hoses ordered.  CAD, h/o cardiac arrest Coronary artery disease HLD PTA on Coreg, Lipitor, I do not see any antiplatelet or anticoagulant in the list Continue Coreg and Lipitor  Type 2 diabetes mellitus A1c 5 in 2023 Not on antidiabetic meds  Morbid Obesity  Body mass index is 35.95 kg/m. Patient has been advised to make an attempt to improve diet and exercise patterns to aid in weight loss.  OSA   Fall at home Reportedly secondary to losing her balance.  Was unable to get up Pelvic x-ray without hip fracture PT eval obtained.  Home with PT recommended.  Goals of care   Code Status: Full Code     DVT prophylaxis:  Place TED hose Start: 06/05/22 0819 heparin injection 5,000 Units Start: 06/02/22 2200   Antimicrobials: None currently Fluid: Not on IV fluid currently Consultants: None Family Communication: None at bedside  Status: Inpatient Level of care:  Telemetry Medical   Patient from: Home Anticipated d/c to: Pending clinical course Needs to continue in-hospital care:  Nephrology evaluation.    Diet:  Diet Order             Diet renal  with fluid restriction Fluid restriction: 1200 mL Fluid; Room service appropriate? Yes; Fluid consistency: Thin  Diet effective now                   Scheduled Meds:  amLODipine  10 mg Oral Daily   atorvastatin  20 mg Oral Daily   carvedilol  12.5 mg Oral BID WC   vitamin B-12  1,000 mcg Oral Daily   fluticasone furoate-vilanterol  1 puff Inhalation Daily   And   umeclidinium bromide  1 puff Inhalation Daily   folic acid  1 mg Oral Daily   heparin  5,000 Units Subcutaneous Q8H   lamoTRIgine  100 mg Oral BID   melatonin  3  mg Oral QHS   montelukast  10 mg Oral QHS   OLANZapine  5 mg Oral QHS   prazosin  2 mg Oral QHS   pregabalin  50 mg Oral TID    PRN meds: albuterol, cyclobenzaprine, melatonin, ondansetron **OR** ondansetron (ZOFRAN) IV, oxyCODONE-acetaminophen   Infusions:     Antimicrobials: Anti-infectives (From admission, onward)    Start     Dose/Rate Route Frequency Ordered Stop   06/02/22 1900  cefTRIAXone (ROCEPHIN) 1 g in sodium chloride 0.9 % 100 mL IVPB        1 g 200 mL/hr over 30 Minutes Intravenous  Once 06/02/22 1852 06/02/22 2122       Nutritional status:  Body mass index is 35.95 kg/m.          Objective: Vitals:   06/06/22 0820 06/06/22 1000  BP:  118/71  Pulse: 60 66  Resp: 17 18  Temp:  98 F (36.7 C)  SpO2: 93% 96%    Intake/Output Summary (Last 24 hours) at 06/06/2022 1150 Last data filed at 06/06/2022 1000 Gross per 24 hour  Intake 360 ml  Output 3250 ml  Net -2890 ml    Filed Weights   06/02/22 2300  Weight: 95 kg   Weight change:  Body mass index is 35.95 kg/m.   Physical Exam: General exam: Elderly African-American female.  Not in pain  skin: No rashes, lesions or ulcers. HEENT: Atraumatic, normocephalic, no obvious bleeding Lungs: Clear to auscultation bilaterally CVS: Regular rate and rhythm, no murmurs GI/Abd soft, nontender, nondistended, bowel sound present CNS: Alert, awake, slow to respond but  oriented x 3. Psychiatry: Unable to examine because of altered mentation Extremities: Continues to have 1+ bilateral pedal edema, no calf tenderness  Data Review: I have personally reviewed the laboratory data and studies available.  F/u labs ordered Unresulted Labs (From admission, onward)     Start     Ordered   06/05/22 1408  C3 complement  Once,   R        06/05/22 1407   06/05/22 1408  C4 complement  Once,   R        06/05/22 1407   06/05/22 1408  Glomerular basement membrane antibodies  Once,   R        06/05/22 1407   06/05/22 1223  ANCA Profile  Once,   R        06/05/22 1225   06/05/22 1222  Multiple Myeloma Panel (SPEP&IFE w/QIG)  Once,   R        06/05/22 1225            Total time spent in review of labs and imaging, patient evaluation, formulation of plan, documentation and communication with family: 45 minutes  Signed, Lorin Glass, MD [facility] 06/06/2022

## 2022-06-07 DIAGNOSIS — N184 Chronic kidney disease, stage 4 (severe): Secondary | ICD-10-CM | POA: Diagnosis not present

## 2022-06-07 DIAGNOSIS — N179 Acute kidney failure, unspecified: Secondary | ICD-10-CM | POA: Diagnosis not present

## 2022-06-07 LAB — RENAL FUNCTION PANEL
Albumin: 2.6 g/dL — ABNORMAL LOW (ref 3.5–5.0)
Anion gap: 9 (ref 5–15)
BUN: 63 mg/dL — ABNORMAL HIGH (ref 8–23)
CO2: 21 mmol/L — ABNORMAL LOW (ref 22–32)
Calcium: 8.5 mg/dL — ABNORMAL LOW (ref 8.9–10.3)
Chloride: 108 mmol/L (ref 98–111)
Creatinine, Ser: 4.33 mg/dL — ABNORMAL HIGH (ref 0.44–1.00)
GFR, Estimated: 11 mL/min — ABNORMAL LOW (ref >60)
Glucose, Bld: 108 mg/dL — ABNORMAL HIGH (ref 70–99)
Phosphorus: 4.6 mg/dL (ref 2.5–4.6)
Potassium: 3.4 mmol/L — ABNORMAL LOW (ref 3.5–5.1)
Sodium: 138 mmol/L (ref 135–145)

## 2022-06-07 LAB — C4 COMPLEMENT: Complement C4, Body Fluid: 32 mg/dL (ref 12–38)

## 2022-06-07 LAB — C3 COMPLEMENT: C3 Complement: 94 mg/dL (ref 82–167)

## 2022-06-07 LAB — ANCA PROFILE
Anti-MPO Antibodies: <0.2 units (ref 0.0–0.9)
Anti-PR3 Antibodies: <0.2 units (ref 0.0–0.9)
Atypical P-ANCA titer: <1:20 {titer}
C-ANCA: <1:20 {titer}
P-ANCA: <1:20 {titer}

## 2022-06-07 MED ORDER — FUROSEMIDE 10 MG/ML IJ SOLN
80.0000 mg | Freq: Once | INTRAMUSCULAR | Status: AC
  Administered 2022-06-07: 80 mg via INTRAVENOUS
  Filled 2022-06-07: qty 8

## 2022-06-07 MED ORDER — ALBUTEROL SULFATE (2.5 MG/3ML) 0.083% IN NEBU: 2.5000 mg | INHALATION_SOLUTION | Freq: Four times a day (QID) | RESPIRATORY_TRACT | Status: DC | PRN

## 2022-06-07 MED ORDER — ALBUTEROL SULFATE (2.5 MG/3ML) 0.083% IN NEBU: 2.5000 mg | INHALATION_SOLUTION | Freq: Four times a day (QID) | RESPIRATORY_TRACT | Status: DC

## 2022-06-07 NOTE — Progress Notes (Signed)
[facility] Progress Note   62 y.o. female with CKD, schizoaffective disorder, COPD, morbid obesity, obstructive sleep apnea, type II diabetes mellitus, HTN, HLD, CASHD, h/o cardiac arrest and anoxic brain jury, history of cocaine use (last use ~2 years ago) admitted to [facility] 5/17 with acute on chronic renal failure. Prior to admission patient reports she was feeling generally weak, not eating or drinking at her baseline. Patient had a reported mechanical fall and was found on the ground by her therapist, who called 911. She states she was only on the ground for 10-20 minutes. On arrival sCr 4.73. She was given intravenous fluids with mild improvement of her renal function, this has now stabilized out with sCr 4.13 and nephrology consulted for further assistance.     Assessment/ Plan:   Acute kidney injury in setting of chronic renal disease stage IV: Overall, her renal function has been worsening steadily over the ; of note she was in the 1.2-1.8 range as recently as 09/19/21 and then now consistently in the m2.6-3 range earlier 2024 with progression into the 4's in 05/2022. [name] Patient chronically uses diuretics for her lower extremity edema, although notes as of late the diuretics have not been working as well. She denies any recent fevers, but reports she is consistently chilled. Denies any nausea, vomiting, cough, hemoptysis, abdominal pain, constipation, diarrhea, dysuria, change in urine color, recent night sweats, weight loss, IV drug use. Patient last used cocaine roughly two years ago and has not been sexually active in the last six years. Renal function initially responded to IV fluids, but I do not think she would benefit from further fluids given she is showing signs of hypervolemia on exam. Initial UA with significant proteinuria, hemoglobinuria without casts noted. Albumin low-normal at 3.5, calcium low-normal at 8.9.   - TTE yesterday with normal EF, grade I diastolic  dysfunction, high atrial pressures suggestive of high output heart failure.  - Labs pending for this AM, yesterday net negative 1.9L.  - This morning symptomatically feels somewhat short of breath, I do appreciate some wheezing. She does typically use albuterol on most days, I have scheduled the albuterol nebulizers. Volume status does appear improved, she has knee-high compression stockings on but I still appreciate lower extremity edema in her thighs.  - Would probably favor another day of IV lasix today given she is still volume up.  - [name] likely poor candidate for hemodialysis given functional status and co-morbidities.   Chronic anemia: Appears to be at baseline, no signs or symptoms of acute bleeding. Likely 2/2 renal disease. Will dose with ESA. WBC and temps don't suggest overwhelming infection but repeat urinalysis suggests possible UTI. [name] asymp.   COPD/OSA: On bronchodilators, sating well on room air currently.  Chronic diastolic heart failure: Gave a dose of Lasix on 5/20 and appears she had a very good response. CAD with history of cardiac arrest: Per primary Type II diabetes mellitus: Sugars at goal, last A1c at goal.   Subjective:   Feels okay, having some mild dyspnea. Lower extremity swelling mildly improving. Otherwise eating, drinking okay, having okay urine output. Denies fevers, chills, nausea, vomiting, abdominal pain.    Objective:   BP (!) 156/87 (BP Location: Left Arm)   Pulse (!) 51   Temp 97.8 F (36.6 C) (Oral)   Resp 17   Ht 5\' 4"  (1.626 m)   Wt 95 kg   SpO2 98%   BMI 35.95 kg/m   Intake/Output Summary (Last 24  hours) at 06/07/2022 0758 Last data filed at 06/07/2022 0547 Gross per 24 hour  Intake 480 ml  Output 2400 ml  Net -1920 ml    Weight change:   Physical Exam: General: Chronically ill-appearing person in no acute distress HEENT: NCAT Eyes: Vision grossly intact. Heart: Regular rate, rhythm. No murmurs. Warm extremities.  Lungs:  Normal work of breathing on room air. Mild end-expiratory wheezing  Abdomen: Soft, non-tender, non-distended.  Extremities: Pitting edema to hips bilaterally with knee-high compression stockings on . Skin: Warm, dry. No rashes, lesions, petechiae appreciated.  Neuro: Awake, alert, conversing appropriately. Grossly non-focal.   Imaging: ECHOCARDIOGRAM COMPLETE  Result Date: 06/06/2022    ECHOCARDIOGRAM REPORT   Patient Name:   Tiffany [name] [facility] Date of Exam: 06/06/2022 Medical Rec #:  [id number]       Height:       64.0 in Accession #:    [id number]      Weight:       209.4 lb Date of Birth:  04-21-1960       BSA:          1.995 m Patient Age:    62 years        BP:           118/71 mmHg Patient Gender: F               HR:           64 bpm. Exam Location:  Inpatient Procedure: 2D Echo, Cardiac Doppler and Color Doppler Indications:    CHF - Acute Diastolic  History:        Patient has prior history of Echocardiogram examinations, most                 recent 02/04/2022. CHF, CAD, CKD, COPD, Carotid Disease and                 Stroke, Signs/Symptoms:Edema, Chest Pain, Syncope and Dyspnea;                 Risk Factors:Diabetes, Hypertension, Dyslipidemia, cocaine                 abuse, Former Smoker and Sleep Apnea.  Sonographer:    Wallie Char Referring Phys: [phone] Len Blalock LIN IMPRESSIONS  1. Left ventricular ejection fraction, by estimation, is 60 to 65%. The left ventricle has normal function. The left ventricle has no regional wall motion abnormalities. Left ventricular diastolic parameters are consistent with Grade I diastolic dysfunction (impaired relaxation). Elevated left atrial pressure.  2. Right ventricular systolic function is normal. The right ventricular size is normal.  3. Left atrial size was moderately dilated.  4. Right atrial size was moderately dilated.  5. The mitral valve is normal in structure. Mild mitral valve regurgitation.  6. The aortic valve is tricuspid. Aortic valve regurgitation is  not visualized. No aortic stenosis is present. Comparison(s): No significant change from prior study. Prior images reviewed side by side. Although estimated mean left atrial pressure is still elevated, it is less severe than on the 02/04/2022 study. Findings suggest high output heart failure (consider anemia, thyrotoxicosis, AF fistula, etc.). FINDINGS  Left Ventricle: Left ventricular ejection fraction, by estimation, is 60 to 65%. The left ventricle has normal function. The left ventricle has no regional wall motion abnormalities. The left ventricular internal cavity size was normal in size. There is  no left ventricular hypertrophy. Left ventricular diastolic parameters are consistent with Grade I diastolic dysfunction (impaired  relaxation). Elevated left atrial pressure. Right Ventricle: The right ventricular size is normal. No increase in right ventricular wall thickness. Right ventricular systolic function is normal. Left Atrium: Left atrial size was moderately dilated. Right Atrium: Right atrial size was moderately dilated. Pericardium: Trivial pericardial effusion is present. Mitral Valve: The mitral valve is normal in structure. Mild mitral valve regurgitation. MV peak gradient, 7.2 mmHg. The mean mitral valve gradient is 3.0 mmHg. Tricuspid Valve: The tricuspid valve is normal in structure. Tricuspid valve regurgitation is mild. Aortic Valve: Aortic valve gradients are exaggerated by high cardiac output. The aortic valve is tricuspid. Aortic valve regurgitation is not visualized. No aortic stenosis is present. Aortic valve mean gradient measures 10.0 mmHg. Aortic valve peak gradient measures 20.7 mmHg. Aortic valve area, by VTI measures 2.24 cm. Pulmonic Valve: The pulmonic valve was grossly normal. Pulmonic valve regurgitation is not visualized. Aorta: The aortic root and ascending aorta are structurally normal, with no evidence of dilitation. IAS/Shunts: No atrial level shunt detected by color flow  Doppler.  LEFT VENTRICLE PLAX 2D LVIDd:         5.40 cm      Diastology LVIDs:         3.50 cm      LV e' medial:    5.73 cm/s LV PW:         1.10 cm      LV E/e' medial:  20.2 LV IVS:        0.90 cm      LV e' lateral:   8.56 cm/s LVOT diam:     1.90 cm      LV E/e' lateral: 13.6 LV SV:         108 LV SV Index:   54 LVOT Area:     2.84 cm  LV Volumes (MOD) LV vol d, MOD A2C: 126.0 ml LV vol d, MOD A4C: 157.0 ml LV vol s, MOD A2C: 49.5 ml LV vol s, MOD A4C: 60.0 ml LV SV MOD A2C:     76.5 ml LV SV MOD A4C:     157.0 ml LV SV MOD BP:      85.0 ml RIGHT VENTRICLE             IVC RV Basal diam:  4.60 cm     IVC diam: 2.40 cm RV S prime:     17.30 cm/s TAPSE (M-mode): 2.6 cm LEFT ATRIUM              Index        RIGHT ATRIUM           Index LA diam:        4.90 cm  2.46 cm/m   RA Area:     25.10 cm LA Vol (A2C):   103.0 ml 51.63 ml/m  RA Volume:   81.30 ml  40.75 ml/m LA Vol (A4C):   92.8 ml  46.51 ml/m LA Biplane Vol: 101.0 ml 50.62 ml/m  AORTIC VALVE AV Area (Vmax):    2.18 cm AV Area (Vmean):   2.30 cm AV Area (VTI):     2.24 cm AV Vmax:           227.50 cm/s AV Vmean:          149.500 cm/s AV VTI:            0.483 m AV Peak Grad:      20.7 mmHg AV Mean Grad:      10.0 mmHg  LVOT Vmax:         175.00 cm/s LVOT Vmean:        121.500 cm/s LVOT VTI:          0.382 m LVOT/AV VTI ratio: 0.79  AORTA Ao Root diam: 3.00 cm Ao Asc diam:  3.60 cm MITRAL VALVE                TRICUSPID VALVE MV Area (PHT): 2.71 cm     TR Peak grad:   36.7 mmHg MV Area VTI:   2.82 cm     TR Vmax:        303.00 cm/s MV Peak grad:  7.2 mmHg MV Mean grad:  3.0 mmHg     SHUNTS MV Vmax:       1.34 m/s     Systemic VTI:  0.38 m MV Vmean:      79.0 cm/s    Systemic Diam: 1.90 cm MV Decel Time: 280 msec MV E velocity: 116.00 cm/s MV A velocity: 130.00 cm/s MV E/A ratio:  0.89 Mihai Croitoru MD Electronically signed by Thurmon Fair MD Signature Date/Time: 06/06/2022/3:28:20 PM    Final    US RENAL  Result Date: 06/05/2022 CLINICAL DATA:   Acute kidney injury. EXAM: RENAL / URINARY TRACT ULTRASOUND COMPLETE COMPARISON:  02/04/2022 FINDINGS: Right Kidney: Renal measurements: 7.4 x 4.3 x 4.3 centimeters = volume: 72 ML. Hyperechoic renal parenchyma. No mass or hydronephrosis. Left Kidney: Renal measurements: 10.5 x 4.5 x 3.8 centimeters = volume: 94 mL. Hyperechoic renal parenchyma. No mass or hydronephrosis. Bladder: Appears normal for degree of bladder distention. Other: None. IMPRESSION: 1. Hyperechoic renal parenchyma, consistent with history of renal disease. 2. No hydronephrosis or mass. Electronically Signed   By: Norva Pavlov M.D.   On: 06/05/2022 17:31    Labs: BMET Recent Labs  Lab 06/02/22 1700 06/02/22 1843 06/03/22 0220 06/04/22 0206 06/05/22 0153 06/06/22 0757  NA 139 141 140 140 136 139  K 4.9 5.0 4.2 4.7 4.2 3.9  CL 107  --  110 112* 109 109  CO2 22  --  21* 21* 20* 22  GLUCOSE 83  --  93 90 89 85  BUN 48*  --  48* 54* 54* 54*  CREATININE 4.73*  --  4.51* 4.55* 4.13* 4.17*  CALCIUM 8.9  --  8.3* 8.1* 8.4* 8.5*    CBC Recent Labs  Lab 06/02/22 1700 06/02/22 1843 06/03/22 0220 06/04/22 0206 06/05/22 0153 06/06/22 0757  WBC 4.6  --  3.9* 3.3* 3.6* 3.0*  NEUTROABS 3.1  --   --  1.7 1.8 1.2*  HGB 8.3*   < > 7.8* 8.0* 8.4* 8.4*  HCT 27.7*   < > 25.8* 26.2* 27.9* 28.1*  MCV 90.8  --  88.7 90.7 90.6 89.8  PLT 204  --  209 206 200 199   < > = values in this interval not displayed.     Medications:     amLODipine  10 mg Oral Daily   atorvastatin  20 mg Oral Daily   carvedilol  12.5 mg Oral BID WC   vitamin B-12  1,000 mcg Oral Daily   escitalopram  20 mg Oral Daily   fluticasone furoate-vilanterol  1 puff Inhalation Daily   And   umeclidinium bromide  1 puff Inhalation Daily   folic acid  1 mg Oral Daily   heparin  5,000 Units Subcutaneous Q8H   lamoTRIgine  100 mg Oral BID   melatonin  3  mg Oral QHS   montelukast  10 mg Oral QHS   OLANZapine  5 mg Oral QHS   prazosin  2 mg Oral QHS    pregabalin  50 mg Oral TID    Evlyn Kanner, MD 06/07/2022, 7:58 AM

## 2022-06-07 NOTE — Progress Notes (Signed)
PROGRESS NOTE  Tiffany [name]  DOB: January 22, 1960  PCP: Tiffany Kocher, DO MWN:[id number]  DOA: 06/02/2022  LOS: 5 days  Hospital Day: 6  Brief narrative: Tiffany [name] is a 62 y.o. female with PMH significant for obesity, OSA, DM2, HTN, HLD, CAD, CHF, bilateral lower extremity edema on diuretics, CKD, carotid artery disease, h/o cardiac arrest and subsequent anoxic brain injury, history of drug abuse, COPD, anxiety/depression, schizoaffective disorder, GERD.  Patient lives alone, has impaired mobility, uses a motorized scooter  5/17, patient lost her balance, fell to the floor, was not able to get up and was later found on the floor by home health nurse.  EMS noted that she was lethargic and hypoxic, started on 4 L oxygen and brought to the ED.  In the ED, patient was afebrile, heart rate 58, blood pressure 130s, breathing on room air She was noted to have severe swelling and tenderness in both her legs. Labs showed hemoglobin low at 8.3, creatinine elevated 4.73. Urinalysis with clear yellow urine, moderate hemoglobin, negative leukocytes, rare bacteria CT head did not show any evidence of acute intracranial malady.  It showed atrophy and chronic small vessel ischemic disease.   Chest x-ray showed cardiomegaly with mild central congestion, possible patchy right mid and lower lung airspace disease Hip x-ray negative for fracture. Admitted to [facility]  Subjective: Patient was seen and examined this morning.  Lying in bed.  Not in distress.  No new symptoms. Nephrology following.  Pedal edema improving after IV Lasix was given.  Creatinine rising up today.  Assessment and plan: AKI on CKD 4 Proteinuria Baseline creatinine seems to be less than 3 until February 2024 after which she seems to have progressive worsening renal function.  Most recent creatinine was 4.19 two weeks ago.  On diuretics at home for chronic lymphedema.   Presented with creatinine elevated to 4.73.  Urine protein more  than 300 Most recent renal ultrasound from January 2024 with chronic medical renal disease and no obstruction. Currently diuretics on hold.   5/20, nephrology consult was obtained.  1 dose of IV Lasix was given.  Brisk diuresis noted.   5/21, 1 more dose of Lasix given. 5/22, noted nephrology ordered for Lasix 80 mg one dose. Creatinine trend as below. Recent Labs    03/05/22 0339 03/06/22 0524 05/05/22 1315 05/19/22 1408 06/02/22 1700 06/03/22 0220 06/04/22 0206 06/05/22 0153 06/06/22 0757 06/07/22 0823  BUN 61* 64* 50* 49* 48* 48* 54* 54* 54* 63*  CREATININE 2.90* 2.73* 3.65* 4.19* 4.73* 4.51* 4.55* 4.13* 4.17* 4.33*    Acute metabolic encephalopathy Schizoaffective disorder Anxiety/depression For the first 24 hours, patient was somnolent.  Suspect due to uremia as well as the effect of morning psych meds.  Mental status gradually improved. PTA on multiple mood altering medications including Cymbalta 30 mg daily, Lexapro 20 mg daily, Lamictal 100 mg twice daily, Zyprexa 5 mg at bedtime, Lyrica 100 mg 3 times daily, Flexeril 10 mg 3 times daily as needed, Saphris 5 mg twice daily. Currently continued on Lexapro Lamictal, Zyprexa, Lyrica and Flexeril.  Cymbalta and safaris on hold.  Mental status improved and stable.  Chronic anemia Hemoglobin at baseline between 8 and 9.  Likely has anemia of chronic disease.  No active bleeding.  Continue to monitor. Recent Labs    06/02/22 1843 06/03/22 0220 06/04/22 0206 06/05/22 0153 06/06/22 0757  HGB 10.5* 7.8* 8.0* 8.4* 8.4*  MCV  --  88.7 90.7 90.6 89.8  VITAMINB12  --   --  301  --   --   FOLATE  --   --  4.1*  --   --   FERRITIN  --   --  30  --   --   TIBC  --   --  287  --   --   IRON  --   --  61  --   --   RETICCTPCT  --   --  1.7  --   --     Abnormal chest x-ray COPD Chest x-ray showed mild congestion, patchy right sided airspace disease. No fever.  WBC count not elevated.  Procalcitonin level low. Given empiric  IV Rocephin in the ED. No further active given.  Remains stable.  Continue bronchodilators Not requiring supplemental oxygen Recent Labs  Lab 06/02/22 1700 06/03/22 0220 06/04/22 0206 06/05/22 0153 06/06/22 0757  WBC 4.6 3.9* 3.3* 3.6* 3.0*  PROCALCITON  --  <0.10  --   --   --     CHF/HTN Bilateral lower extremity edema PTA on carvedilol 12.5 mg twice daily, amlodipine 5 mg twice daily, prazosin 2 mg at bedtime, torsemide 20 mg daily. Continue Coreg, amlodipine, prazosin. Torsemide on hold because of AKI.   Ultrasound duplex scan of both legs negative for DVT.  TED hoses ordered.  CAD, h/o cardiac arrest Coronary artery disease HLD PTA on Coreg, Lipitor, I do not see any antiplatelet or anticoagulant in the list Continue Coreg and Lipitor  Type 2 diabetes mellitus A1c 5 in 2023 Not on antidiabetic meds  Morbid Obesity  Body mass index is 35.95 kg/m. Patient has been advised to make an attempt to improve diet and exercise patterns to aid in weight loss.  OSA   Fall at home Reportedly secondary to losing her balance.  Was unable to get up Pelvic x-ray without hip fracture PT eval obtained.  Home with PT recommended.  Goals of care   Code Status: Full Code     DVT prophylaxis:  Place TED hose Start: 06/05/22 0819 heparin injection 5,000 Units Start: 06/02/22 2200   Antimicrobials: None currently Fluid: Not on IV fluid currently Consultants: None Family Communication: None at bedside  Status: Inpatient Level of care:  Telemetry Medical   Patient from: Home Anticipated d/c to: Pending clinical course Needs to continue in-hospital care:  Nephrology following.  Hopefully home in 1 to 2 days with home health.    Diet:  Diet Order             Diet renal with fluid restriction Fluid restriction: 1200 mL Fluid; Room service appropriate? Yes; Fluid consistency: Thin  Diet effective now                   Scheduled Meds:  amLODipine  10 mg Oral  Daily   atorvastatin  20 mg Oral Daily   carvedilol  12.5 mg Oral BID WC   vitamin B-12  1,000 mcg Oral Daily   escitalopram  20 mg Oral Daily   fluticasone furoate-vilanterol  1 puff Inhalation Daily   And   umeclidinium bromide  1 puff Inhalation Daily   folic acid  1 mg Oral Daily   heparin  5,000 Units Subcutaneous Q8H   lamoTRIgine  100 mg Oral BID   melatonin  3 mg Oral QHS   montelukast  10 mg Oral QHS   OLANZapine  5 mg Oral QHS   prazosin  2 mg Oral QHS   pregabalin  50 mg Oral TID  PRN meds: albuterol, cyclobenzaprine, melatonin, ondansetron **OR** ondansetron (ZOFRAN) IV, oxyCODONE-acetaminophen   Infusions:     Antimicrobials: Anti-infectives (From admission, onward)    Start     Dose/Rate Route Frequency Ordered Stop   06/02/22 1900  cefTRIAXone (ROCEPHIN) 1 g in sodium chloride 0.9 % 100 mL IVPB        1 g 200 mL/hr over 30 Minutes Intravenous  Once 06/02/22 1852 06/02/22 2122       Nutritional status:  Body mass index is 35.95 kg/m.          Objective: Vitals:   06/07/22 0802 06/07/22 0927  BP:  137/73  Pulse: 60 61  Resp: 17 18  Temp:  97.8 F (36.6 C)  SpO2: 98% 97%    Intake/Output Summary (Last 24 hours) at 06/07/2022 1408 Last data filed at 06/07/2022 0900 Gross per 24 hour  Intake 720 ml  Output 1400 ml  Net -680 ml    Filed Weights   06/02/22 2300  Weight: 95 kg   Weight change:  Body mass index is 35.95 kg/m.   Physical Exam: General exam: Elderly African-American female.  Not in pain.  Not in distress skin: No rashes, lesions or ulcers. HEENT: Atraumatic, normocephalic, no obvious bleeding Lungs: Clear to auscultation bilaterally CVS: Regular rate and rhythm, no murmurs GI/Abd soft, nontender, nondistended, bowel sound present CNS: Alert, awake, slow to respond but oriented x 3. Psychiatry: Unable to examine because of altered mentation Extremities: Improving bilateral pedal edema, no calf tenderness  Data  Review: I have personally reviewed the laboratory data and studies available.  F/u labs ordered Unresulted Labs (From admission, onward)     Start     Ordered   06/08/22 0500  Basic metabolic panel  Daily,   R      06/07/22 0830   06/08/22 0500  CBC with Differential/Platelet  Daily,   R      06/07/22 0830   06/05/22 1223  ANCA Profile  Once,   R        06/05/22 1225   06/05/22 1222  Multiple Myeloma Panel (SPEP&IFE w/QIG)  Once,   R        06/05/22 1225            Total time spent in review of labs and imaging, patient evaluation, formulation of plan, documentation and communication with family: 45 minutes  Signed, Lorin Glass, MD [facility] 06/07/2022

## 2022-06-07 NOTE — Consult Note (Signed)
   [facility] Inpatient Consult   06/07/2022  Tiffany [name] 02-23-1960 [id number]  [facility] [[facility]]  Accountable Care Organization [ACO] Patient:  Primary Care Provider:  Tiffany Kocher, DO [facility] is listed,  Patient screened for hospitalization with noted extreme high risk score for unplanned readmission risk and to assess for potential [facility]  [[facility]] Care Management service needs for post hospital transition for care coordination.   Met with the patient at length regarding information.  Patient does not endorse Tiffany Kocher, DO at [facility] as her PCP.  Patient states her Apartment information is not correct.  Patient states she is in [address] and not [address]  Patient states, that  [name], MD is her PCP and she doesn't know,  "why the information keeps getting crossed-up."  Explained that this PCP is not in the [facility] network and that this provider may not be getting her hospital information.   Patient was encouraged to update her information she wants about her new provider.  Spoke with inpatient TOC LCSW regarding APS status.  Patient also endorses she does have an APS worker that she states came by yesterday.  Patient screened for hospitalization with noted extreme high risk score for unplanned readmission risk and to assess for potential [facility]  [[facility] Care Management service needs for post hospital transition for care coordination.    Plan:  Will alert team of PCP not in network, patient states.  Of note, [facility] Care Management/Population Health does not replace or interfere with any arrangements made by the Inpatient Transition of Care team.  For questions contact:   Charlesetta Shanks, RN BSN CCM [facility]  [phone] business mobile phone Toll free office [phone]  *Concierge Line  [phone] Fax number:  [phone] [email or web address]

## 2022-06-07 NOTE — Plan of Care (Signed)
  Problem: Health Behavior/Discharge Planning: Goal: Ability to manage health-related needs will improve Outcome: Progressing   

## 2022-06-07 NOTE — Progress Notes (Signed)
Occupational Therapy Treatment Patient Details Name: Tiffany [name] MRN: [id number] DOB: [birth year] Today's Date: 06/07/2022   History of present illness Tiffany [name] is a 62 y.o. female who, on 5/17, patient lost her balance, fell to the floor, was not able to get up and was later found on the floor by home health nurse;  with PMH significant for obesity, OSA, DM2, HTN, HLD, CAD, CHF, bilateral lower extremity edema on diuretics, CKD, carotid artery disease, h/o cardiac arrest and subsequent anoxic brain injury, history of drug abuse, COPD, anxiety/depression, schizoaffective disorder, GERD.  Patient lives alone, has impaired mobility, uses a motorized scooter   OT comments  Pt progressing towards OT goals this session. Focused on established HEP with theraband. Some exercises in supine, some EOB (min A) VSS throughout session. Max education on OOB to recliner for practice transfer to [facility] and Pt declined despite education/encouragement. OT will continue to follow acutely and POC remains appropriate.    Recommendations for follow up therapy are one component of a multi-disciplinary discharge planning process, led by the attending physician.  Recommendations may be updated based on patient status, additional functional criteria and insurance authorization.    Assistance Recommended at Discharge Intermittent Supervision/Assistance  Patient can return home with the following  A little help with bathing/dressing/bathroom;Two people to help with bathing/dressing/bathroom;Assist for transportation;Help with stairs or ramp for entrance;A lot of help with walking and/or transfers   Equipment Recommendations  None recommended by OT    Recommendations for Other Services      Precautions / Restrictions Precautions Precautions: Fall Precaution Comments: Does not ambulate at baseline, Transfers only. Restrictions Weight Bearing Restrictions: No       Mobility Bed Mobility Overal bed mobility:  Needs Assistance Bed Mobility: Supine to Sit, Sit to Supine     Supine to sit: Min assist Sit to supine: Min guard   General bed mobility comments: Light assist and use of rails for transition to sitting. Increased time to scoot out and get feet on the floor. pt able to self mobilize LEs to and on bed and reposition    Transfers                   General transfer comment: NT this session, Pt declined despite max education and encouragement as well as positive interactions with OT (was aggrivated with NT earlier today)     Balance Overall balance assessment: Needs assistance Sitting-balance support: Feet supported, No upper extremity supported Sitting balance-Leahy Scale: Fair                                     ADL either performed or assessed with clinical judgement   ADL Overall ADL's : Needs assistance/impaired     Grooming: Set up;Sitting;Wash/dry face   Upper Body Bathing: Set up;Supervision/ safety;Sitting                   Toileting- Clothing Manipulation and Hygiene: Maximal assistance       Functional mobility during ADLs:  (transfer to chair not attempted due set up)      Extremity/Trunk Assessment Upper Extremity Assessment Upper Extremity Assessment: Generalized weakness       Cervical / Trunk Assessment Cervical / Trunk Assessment: Other exceptions (incresaed body habitus)    Vision Baseline Vision/History: 0 No visual deficits     Perception     Praxis  Cognition Arousal/Alertness: Awake/alert Behavior During Therapy: WFL for tasks assessed/performed (schitzoaffective) Overall Cognitive Status: Within Functional Limits for tasks assessed (most likely baseline)                                          Exercises Exercises: General Lower Extremity General Exercises - Lower Extremity Long Arc Quad: AROM, Right, Left, 10 reps, Seated Hip Flexion/Marching: AROM, Right, Left, 10 reps,  Seated Toe Raises: AAROM, Right, Left, 10 reps Heel Raises: AAROM, Right, Left, 10 reps, Seated Other Exercises Other Exercises: theraband Bilateral elbow flexion/extension x10 Other Exercises: theraband Bil UE shoulder extension/abduction x10 Other Exercises: theraband bilateral UE horizontal ab/adduction    Shoulder Instructions       General Comments Pt very cooperative with OT throughout session. Provided theraband and HEP and enjoyed [place] music    Pertinent Vitals/ Pain          Home Living Family/patient expects to be discharged to:: Private residence Living Arrangements: Alone Available Help at Discharge: Personal care attendant;Available PRN/intermittently (4 hours/day 7 days/wk) Type of Home: Apartment Home Access: Stairs to enter Entrance Stairs-Number of Steps: 3; patient currently has EMS take her in and out if needed; has gotten approval for getting a ramp built Entrance Stairs-Rails: None Home Layout: One level     Bathroom Shower/Tub: Sponge bathes at baseline (Uses wipes)   Bathroom Toilet: Standard Bathroom Accessibility: Yes How Accessible: Accessible via walker Home Equipment: Rollator (4 wheels);BSC/3in1;Wheelchair - power;Wheelchair - manual   Additional Comments: Pt reports that she sleeps on the couch      Prior Functioning/Environment              Frequency  Min 2X/week        Progress Toward Goals  OT Goals(current goals can now be found in the care plan section)  Progress towards OT goals: Progressing toward goals  Acute Rehab OT Goals Patient Stated Goal: get better and get home OT Goal Formulation: With patient Time For Goal Achievement: 06/19/22 Potential to Achieve Goals: Good  Plan Discharge plan remains appropriate    Co-evaluation                 AM-PAC OT "6 Clicks" Daily Activity     Outcome Measure   Help from another person eating meals?: None Help from another person taking care of personal  grooming?: A Little Help from another person toileting, which includes using toliet, bedpan, or urinal?: A Lot Help from another person bathing (including washing, rinsing, drying)?: A Little Help from another person to put on and taking off regular upper body clothing?: A Little Help from another person to put on and taking off regular lower body clothing?: A Lot 6 Click Score: 17    End of Session    OT Visit Diagnosis: Other abnormalities of gait and mobility (R26.89);Muscle weakness (generalized) (M62.81)   Activity Tolerance Patient tolerated treatment well   Patient Left in bed;with call bell/phone within reach;with bed alarm set   Nurse Communication Mobility status        Time: 1914-7829 OT Time Calculation (min): 36 min  Charges: OT General Charges $OT Visit: 1 Visit OT Treatments $Self Care/Home Management : 8-22 mins $Therapeutic Exercise: 8-22 mins  Nyoka Cowden OTR/L Acute Rehabilitation Services Office: [phone]  Evern Bio [facility] 06/07/2022, 12:21 PM

## 2022-06-08 DIAGNOSIS — N179 Acute kidney failure, unspecified: Secondary | ICD-10-CM | POA: Diagnosis not present

## 2022-06-08 DIAGNOSIS — N184 Chronic kidney disease, stage 4 (severe): Secondary | ICD-10-CM | POA: Diagnosis not present

## 2022-06-08 LAB — CBC WITH DIFFERENTIAL/PLATELET
Abs Immature Granulocytes: 0.01 10*3/uL (ref 0.00–0.07)
Basophils Absolute: 0 10*3/uL (ref 0.0–0.1)
Basophils Relative: 1 %
Eosinophils Absolute: 0.2 10*3/uL (ref 0.0–0.5)
Eosinophils Relative: 5 %
HCT: 24.9 % — ABNORMAL LOW (ref 36.0–46.0)
Hemoglobin: 7.6 g/dL — ABNORMAL LOW (ref 12.0–15.0)
Immature Granulocytes: 0 %
Lymphocytes Relative: 38 %
Lymphs Abs: 1.2 10*3/uL (ref 0.7–4.0)
MCH: 27 pg (ref 26.0–34.0)
MCHC: 30.5 g/dL (ref 30.0–36.0)
MCV: 88.6 fL (ref 80.0–100.0)
Monocytes Absolute: 0.4 10*3/uL (ref 0.1–1.0)
Monocytes Relative: 12 %
Neutro Abs: 1.5 10*3/uL — ABNORMAL LOW (ref 1.7–7.7)
Neutrophils Relative %: 44 %
Platelets: 188 10*3/uL (ref 150–400)
RBC: 2.81 MIL/uL — ABNORMAL LOW (ref 3.87–5.11)
RDW: 17.1 % — ABNORMAL HIGH (ref 11.5–15.5)
WBC: 3.3 10*3/uL — ABNORMAL LOW (ref 4.0–10.5)
nRBC: 0 % (ref 0.0–0.2)

## 2022-06-08 LAB — BASIC METABOLIC PANEL
Anion gap: 8 (ref 5–15)
BUN: 66 mg/dL — ABNORMAL HIGH (ref 8–23)
CO2: 24 mmol/L (ref 22–32)
Calcium: 8.6 mg/dL — ABNORMAL LOW (ref 8.9–10.3)
Chloride: 107 mmol/L (ref 98–111)
Creatinine, Ser: 4.6 mg/dL — ABNORMAL HIGH (ref 0.44–1.00)
GFR, Estimated: 10 mL/min — ABNORMAL LOW (ref >60)
Glucose, Bld: 99 mg/dL (ref 70–99)
Potassium: 3.6 mmol/L (ref 3.5–5.1)
Sodium: 139 mmol/L (ref 135–145)

## 2022-06-08 MED ORDER — OXYCODONE-ACETAMINOPHEN 5-325 MG PO TABS: 1.0000 | ORAL_TABLET | Freq: Three times a day (TID) | ORAL | 0 refills | Status: DC | PRN

## 2022-06-08 MED ORDER — FERROUS SULFATE 325 (65 FE) MG PO TABS: 325.0000 mg | ORAL_TABLET | Freq: Every day | ORAL | 0 refills | Status: DC

## 2022-06-08 MED ORDER — CYANOCOBALAMIN 1000 MCG PO TABS: 1000.0000 ug | ORAL_TABLET | Freq: Every day | ORAL | 2 refills | Status: DC

## 2022-06-08 MED ORDER — FOLIC ACID 1 MG PO TABS: 1.0000 mg | ORAL_TABLET | Freq: Every day | ORAL | 2 refills | Status: AC

## 2022-06-08 MED ORDER — FERROUS SULFATE 325 (65 FE) MG PO TABS: 325.0000 mg | ORAL_TABLET | Freq: Every day | ORAL | Status: DC

## 2022-06-08 NOTE — Progress Notes (Signed)
Physical Therapy Treatment Patient Details Name: Tiffany [name] MRN: [id number] DOB: [birth year] Today's Date: 06/08/2022   History of Present Illness Tiffany [name] is a 62 y.o. female who, on 5/17, patient lost her balance, fell to the floor, was not able to get up and was later found on the floor by home health nurse;  with PMH significant for obesity, OSA, DM2, HTN, HLD, CAD, CHF, bilateral lower extremity edema on diuretics, CKD, carotid artery disease, h/o cardiac arrest and subsequent anoxic brain injury, history of drug abuse, COPD, anxiety/depression, schizoaffective disorder, GERD.  Patient lives alone, has impaired mobility, uses a motorized scooter    PT Comments    Pt greeted resting in bed and eager for session and OOB mobility. Pt able to demonstrate lateral scoot transfer EOB>recliner with min guard and chair blocked for safety. Pt able to continue standing trials this session in stedy frame needing mod A to power up initially from low recliner down to min guard from stedy flaps with cues for anterior weight shift and upright posture. Pt able to march single LE with this PTA blocking contralateral knee as pt with increased knee buckling this session. Pt with good participation and pleasant throughout session. Pt continues to benefit from skilled PT services to progress toward functional mobility goals.    Recommendations for follow up therapy are one component of a multi-disciplinary discharge planning process, led by the attending physician.  Recommendations may be updated based on patient status, additional functional criteria and insurance authorization.  Follow Up Recommendations       Assistance Recommended at Discharge Intermittent Supervision/Assistance  Patient can return home with the following A lot of help with walking and/or transfers;Assistance with cooking/housework;Assist for transportation;Help with stairs or ramp for entrance   Equipment Recommendations    (trapeze bar for pt's hospital bed; highly recommend installing a ramp)    Recommendations for Other Services       Precautions / Restrictions Precautions Precautions: Fall Precaution Comments: Does not ambulate at baseline, Transfers only. Restrictions Weight Bearing Restrictions: No     Mobility  Bed Mobility Overal bed mobility: Needs Assistance Bed Mobility: Supine to Sit     Supine to sit: Min guard     General bed mobility comments: min guard and increased time to scoot out to EOB    Transfers Overall transfer level: Needs assistance Equipment used: Ambulation equipment used, None Transfers: Sit to/from Stand, Bed to chair/wheelchair/BSC Sit to Stand: Mod assist, Min guard          Lateral/Scoot Transfers: Min guard General transfer comment: min guard to scoot from EOB to chair with chair blocked, mod A to initially come to stand from low recliner in stedy frame, min guard to stand from stedy pads,    Ambulation/Gait             Pre-gait activities: marching with single LE with contralateral knee blocked x5 each side, x2 sets     Stairs             Wheelchair Mobility    Modified Rankin (Stroke Patients Only)       Balance Overall balance assessment: Needs assistance Sitting-balance support: Feet supported, No upper extremity supported Sitting balance-Leahy Scale: Fair     Standing balance support: Bilateral upper extremity supported, During functional activity, Reliant on assistive device for balance Standing balance-Leahy Scale: Poor Standing balance comment: heavy reliance on UE supprot  Cognition Arousal/Alertness: Awake/alert Behavior During Therapy: WFL for tasks assessed/performed (schitzoaffective) Overall Cognitive Status: Within Functional Limits for tasks assessed (most likely baseline)                                          Exercises General Exercises - Lower  Extremity Long Arc Quad: AROM, Right, Left, 10 reps, Seated Hip Flexion/Marching: AROM, Right, Left, 10 reps, Seated    General Comments        Pertinent Vitals/Pain Pain Assessment Pain Assessment: No/denies pain    Home Living                          Prior Function            PT Goals (current goals can now be found in the care plan section) Acute Rehab PT Goals PT Goal Formulation: With patient Time For Goal Achievement: 06/18/22 Progress towards PT goals: Progressing toward goals    Frequency    Min 3X/week      PT Plan Current plan remains appropriate    Co-evaluation              AM-PAC PT "6 Clicks" Mobility   Outcome Measure  Help needed turning from your back to your side while in a flat bed without using bedrails?: A Little Help needed moving from lying on your back to sitting on the side of a flat bed without using bedrails?: A Little Help needed moving to and from a bed to a chair (including a wheelchair)?: A Little (via lateral scoot) Help needed standing up from a chair using your arms (e.g., wheelchair or bedside chair)?: A Lot Help needed to walk in hospital room?: Total Help needed climbing 3-5 steps with a railing? : Total 6 Click Score: 13    End of Session   Activity Tolerance: Patient tolerated treatment well Patient left: in chair;with call bell/phone within reach;with chair alarm set Nurse Communication: Mobility status PT Visit Diagnosis: Other abnormalities of gait and mobility (R26.89)     Time: 1610-9604 PT Time Calculation (min) (ACUTE ONLY): 20 min  Charges:  $Therapeutic Activity: 8-22 mins                     Tiffany [name] R. PTA Acute Rehabilitation Services Office: [phone]   Catalina Antigua 06/08/2022, 2:51 PM

## 2022-06-08 NOTE — TOC Transition Note (Addendum)
Transition of Care [facility]) - CM/SW Discharge Note   Patient Details  Name: Tiffany [name] MRN: [id number] Date of Birth: 05/26/1960  Transition of Care [facility]) CM/SW Contact:  Tom-Johnson, Hershal Coria, RN Phone Number: 06/08/2022, 2:33 PM   Clinical Narrative:     Patient is scheduled for discharge today.  Readmission Risk Assessment done. Home Health resumption of care referral, hospital f/u and discharge instructions on AVS. [facility] scheduled for transportation.  No further TOC needs noted.  15:25- CM informed by RN that patient has called a ride and requests to cancel PTAR. CM and RN spoke with patient at bedside to ensure she has a safe ride home. Patient insisted on going home with her friend and then they will call EMS to take her inside. CM tried to explain to patient that [facility] will pick her up from her hospital room and take her directly to her bedroom at home. Patient got agitated and insisted on going home with her ride.  PTAR cancelled. No further TOC need noted.      Final next level of care: Home w Home Health Services Barriers to Discharge: Barriers Resolved   Patient Goals and CMS Choice CMS Medicare.gov Compare Post Acute Care list provided to:: Patient Choice offered to / list presented to : Patient  Discharge Placement                  Patient to be transferred to facility by: PTAR      Discharge Plan and Services Additional resources added to the After Visit Summary for     Discharge Planning Services: CM Consult Post Acute Care Choice: Home Health          DME Arranged: [facility] Agency: [employer] Date DME Agency Contacted: 06/05/22 Time DME Agency Contacted: [phone] Representative spoke with at DME Agency: [facility] Arranged: PT, OT, RN (Active with RN discipline) HH Agency: [facility] Date HH Agency Contacted: 06/05/22 Time HH Agency Contacted: [phone] Representative spoke with at [facility] Agency: Ephriam Knuckles  Social Determinants of Health  (SDOH) Interventions SDOH Screenings   Food Insecurity: Patient Declined (02/05/2022)  Housing: Medium Risk (02/07/2022)  Transportation Needs: Unmet Transportation Needs (02/07/2022)  Utilities: Patient Declined (02/05/2022)  Alcohol Screen: Low Risk  (07/24/2021)  Depression (PHQ2-9): Medium Risk (04/03/2022)  Financial Resource Strain: Medium Risk (07/24/2021)  Physical Activity: Inactive (07/24/2021)  Social Connections: Moderately Isolated (07/24/2021)  Stress: Stress Concern Present (07/24/2021)  Tobacco Use: Medium Risk (06/02/2022)     Readmission Risk Interventions    06/05/2022    2:47 PM 02/07/2022    2:22 PM 05/24/2021    6:19 PM  Readmission Risk Prevention Plan  Transportation Screening Complete Complete Complete  Medication Review (RN Care Manager) Referral to Pharmacy Complete Referral to Pharmacy  PCP or Specialist appointment within 3-5 days of discharge Complete Complete Complete  HRI or Home Care Consult Complete Complete Complete  SW Recovery Care/Counseling Consult Complete Complete Complete  Palliative Care Screening Not Applicable Not Applicable   Skilled Nursing Facility Not Applicable Not Applicable Complete

## 2022-06-08 NOTE — Discharge Summary (Signed)
Physician Discharge Summary  Tiffany [name]:[id number] DOB: [birth year] DOA: 06/02/2022  PCP: Tiffany Kocher, DO  Admit date: 06/02/2022 Discharge date: 06/08/2022  Admitted From: Home Discharge disposition: Home with home health PT  Recommendations at discharge:  Diuretics have been stopped.  Need to follow-up with nephrology as an outpatient  Continue TED hose for bilateral pedal edema  Brief narrative: Tiffany [name] is a 62 y.o. female with PMH significant for obesity, OSA, DM2, HTN, HLD, CAD, CHF, bilateral lower extremity edema on diuretics, CKD, carotid artery disease, h/o cardiac arrest and subsequent anoxic brain injury, history of drug abuse, COPD, anxiety/depression, schizoaffective disorder, GERD.  Patient lives alone, has impaired mobility, uses a motorized scooter  5/17, patient lost her balance, fell to the floor, was not able to get up and was later found on the floor by home health nurse.  EMS noted that she was lethargic and hypoxic, started on 4 L oxygen and brought to the ED.  In the ED, patient was afebrile, heart rate 58, blood pressure 130s, breathing on room air She was noted to have severe swelling and tenderness in both her legs. Labs showed hemoglobin low at 8.3, creatinine elevated 4.73. Urinalysis with clear yellow urine, moderate hemoglobin, negative leukocytes, rare bacteria CT head did not show any evidence of acute intracranial malady.  It showed atrophy and chronic small vessel ischemic disease.   Chest x-ray showed cardiomegaly with mild central congestion, possible patchy right mid and lower lung airspace disease Hip x-ray negative for fracture. Admitted to [facility]  Subjective: Patient was seen and examined this morning.  Lying in bed.  Not in distress Nephrology follow-up appreciated.  Creatinine slightly up today, clinically euvolemic.  Hospital course: AKI on CKD 4 Proteinuria Baseline creatinine seems to be less than 3 until February 2024  after which she seems to have progressive worsening renal function.  Most recent creatinine was 4.19 two weeks ago.  On diuretics at home for chronic lymphedema.   Presented with creatinine elevated to 4.73.  Urine protein more than 300 Most recent renal ultrasound from January 2024 with chronic medical renal disease and no obstruction. Initially given IV fluid.  Diuretics was held. 5/20, nephrology consult was obtained.  Patient received 3 days of IV Lasix daily.  Brisk diuresis noted.  Pedal edema improved.  Creatinine elevated, 4.6 today.  Probably new baseline. Per nephrology recommendation, no further IV diuresis needed.  Will discharge the patient.  Have recommended to hold diuretics till she gets seen by nephrology as an outpatient. Recent Labs    03/06/22 0524 05/05/22 1315 05/19/22 1408 06/02/22 1700 06/03/22 0220 06/04/22 0206 06/05/22 0153 06/06/22 0757 06/07/22 0823 06/08/22 0154  BUN 64* 50* 49* 48* 48* 54* 54* 54* 63* 66*  CREATININE 2.73* 3.65* 4.19* 4.73* 4.51* 4.55* 4.13* 4.17* 4.33* 4.60*   Acute metabolic encephalopathy Schizoaffective disorder Anxiety/depression For the first 24 hours, patient was somnolent.  Suspect due to uremia as well as the effect of morning psych meds.  Mental status gradually improved. PTA on multiple mood altering medications including Cymbalta 30 mg daily, Lexapro 20 mg daily, Lamictal 100 mg twice daily, Zyprexa 5 mg at bedtime, Lyrica 100 mg 3 times daily, Flexeril 10 mg 3 times daily as needed, Saphris 5 mg twice daily. Currently continued on Lexapro Lamictal, Zyprexa, Lyrica and Flexeril.  Cymbalta and safaris on hold.  Mental status improved and stable.  I would continue the medicines that she is getting now.    Chronic  anemia Chronic vitamin B12 and folate deficiency Hemoglobin at baseline between 8 and 9.  Likely has anemia of chronic disease.  No active bleeding.   Vitamin B12 folate level low as below.  Replacement  ordered. Recent Labs    06/03/22 0220 06/04/22 0206 06/05/22 0153 06/06/22 0757 06/08/22 0154  HGB 7.8* 8.0* 8.4* 8.4* 7.6*  MCV 88.7 90.7 90.6 89.8 88.6  VITAMINB12  --  301  --   --   --   FOLATE  --  4.1*  --   --   --   FERRITIN  --  30  --   --   --   TIBC  --  287  --   --   --   IRON  --  61  --   --   --   RETICCTPCT  --  1.7  --   --   --    Abnormal chest x-ray COPD Chest x-ray showed mild congestion, patchy right sided airspace disease. No fever.  WBC count not elevated.  Procalcitonin level low. Given empiric IV Rocephin in the ED. No further active given.  Remains stable.  Continue bronchodilators Not requiring supplemental oxygen Recent Labs  Lab 06/03/22 0220 06/04/22 0206 06/05/22 0153 06/06/22 0757 06/08/22 0154  WBC 3.9* 3.3* 3.6* 3.0* 3.3*  PROCALCITON <0.10  --   --   --   --    CHF/HTN Bilateral lower extremity edema PTA on carvedilol 12.5 mg twice daily, amlodipine 5 mg twice daily, prazosin 2 mg at bedtime, torsemide 20 mg daily. Continue Coreg, amlodipine, prazosin. Torsemide on hold because of AKI.   Ultrasound duplex scan of both legs negative for DVT.  TED hoses ordered.  CAD, h/o cardiac arrest Coronary artery disease HLD PTA on Coreg, Lipitor, I do not see any antiplatelet or anticoagulant in the list Continue Coreg and Lipitor  Type 2 diabetes mellitus A1c 5 in 2023 Not on antidiabetic meds  Morbid Obesity  Body mass index is 35.95 kg/m. Patient has been advised to make an attempt to improve diet and exercise patterns to aid in weight loss.  OSA   Fall at home Reportedly secondary to losing her balance.  Was unable to get up Pelvic x-ray without hip fracture PT eval obtained.  Home with PT recommended.  Goals of care   Code Status: Full Code   Wounds:  - Wound / Incision (Open or Dehisced) 05/11/20 Heel Left open skin, pink and no drainage (Active)  Date First Assessed: 05/11/20   Location: Heel  Location Orientation:  Left  Wound Description (Comments): open skin, pink and no drainage  Present on Admission: Yes    Assessments 05/14/2020  9:20 AM 08/15/2020  9:10 PM  Dressing Type -- Foam - Lift dressing to assess site every shift  Dressing Changed -- Reinforced  Dressing Status -- Clean;Dry;Intact  Dressing Change Frequency -- PRN  Site / Wound Assessment -- Clean;Dry  Peri-wound Assessment -- Intact  Margins Attached edges (approximated) --  Closure Approximated --  Drainage Amount -- None     No associated orders.     Pressure Injury Sacrum Mid Stage 2 -  Partial thickness loss of dermis presenting as a shallow open injury with a red, pink wound bed without slough. (Active)  No Date First Assessed or Time First Assessed found.   Location: Sacrum  Location Orientation: Mid  Staging: Stage 2 -  Partial thickness loss of dermis presenting as a shallow open injury with a  red, pink wound bed without slough.    Assessments 02/06/2022  3:00 PM 03/05/2022  8:30 PM  Dressing Type Foam - Lift dressing to assess site every shift Foam - Lift dressing to assess site every shift  Dressing Clean, Dry, Intact Changed  Dressing Change Frequency PRN --  Site / Wound Assessment -- Pink  Drainage Amount -- None     No associated orders.     Wound / Incision (Open or Dehisced) 06/02/22 Non-pressure wound Buttocks Right (Active)  Date First Assessed/Time First Assessed: 06/02/22 2340   Wound Type: Non-pressure wound  Location: Buttocks  Location Orientation: Right  Present on Admission: Yes    Assessments 06/03/2022  3:00 AM 06/08/2022  8:18 AM  Dressing Type Foam - Lift dressing to assess site every shift Foam - Lift dressing to assess site every shift  Dressing Changed -- Changed  Dressing Status -- Clean, Dry, Intact  Site / Wound Assessment Dry;Painful --  Peri-wound Assessment Intact --  Wound Length (cm) 4 cm --  Wound Width (cm) 2 cm --  Wound Surface Area (cm^2) 8 cm^2 --     No associated orders.     Discharge Exam:   Vitals:   06/07/22 2127 06/08/22 0507 06/08/22 0815 06/08/22 0909  BP: (!) 149/75 124/76    Pulse: 63 (!) 51 (!) 58   Resp: 18 17    Temp: 98.5 F (36.9 C) 97.8 F (36.6 C)    TempSrc:  Oral    SpO2: 99% 97%  97%  Weight:      Height:        Body mass index is 35.95 kg/m.   General exam: Elderly African-American female.  Not in pain.  Not in distress skin: No rashes, lesions or ulcers. HEENT: Atraumatic, normocephalic, no obvious bleeding Lungs: Clear to auscultation bilaterally CVS: Regular rate and rhythm, no murmurs GI/Abd soft, nontender, nondistended, bowel sound present CNS: Alert, awake, oriented x 3. Psychiatry: Mood appropriate Extremities: Much improved bilateral pedal edema, no calf tenderness  Follow ups:    Follow-up Information     Tiffany Kocher, DO Follow up.   Specialty: Family Medicine Contact information: [address] [place] [address] [phone]                 Discharge Instructions:   Discharge Instructions     Call MD for:  difficulty breathing, headache or visual disturbances   Complete by: As directed    Call MD for:  extreme fatigue   Complete by: As directed    Call MD for:  hives   Complete by: As directed    Call MD for:  persistant dizziness or light-headedness   Complete by: As directed    Call MD for:  persistant nausea and vomiting   Complete by: As directed    Call MD for:  severe uncontrolled pain   Complete by: As directed    Call MD for:  temperature >100.4   Complete by: As directed    Diet general   Complete by: As directed    Discharge instructions   Complete by: As directed    Recommendations at discharge:   Diuretics have been stopped.  Need to follow-up with nephrology as an outpatient   Continue TED hose for bilateral pedal edema  General discharge instructions: Follow with Primary MD Tiffany Kocher, DO in 7 days  Please request your PCP  to go over your  hospital tests, procedures, radiology results at the follow  up. Please get your medicines reviewed and adjusted.  Your PCP may decide to repeat certain labs or tests as needed. Do not drive, operate heavy machinery, perform activities at heights, swimming or participation in water activities or provide baby sitting services if your were admitted for syncope or siezures until you have seen by Primary MD or a Neurologist and advised to do so again. [place] Controlled Substance Reporting System database was reviewed. Do not drive, operate heavy machinery, perform activities at heights, swim, participate in water activities or provide baby-sitting services while on medications for pain, sleep and mood until your outpatient physician has reevaluated you and advised to do so again.  You are strongly recommended to comply with the dose, frequency and duration of prescribed medications. Activity: As tolerated with Full fall precautions use walker/cane & assistance as needed Avoid using any recreational substances like cigarette, tobacco, alcohol, or non-prescribed drug. If you experience worsening of your admission symptoms, develop shortness of breath, life threatening emergency, suicidal or homicidal thoughts you must seek medical attention immediately by calling 911 or calling your MD immediately  if symptoms less severe. You must read complete instructions/literature along with all the possible adverse reactions/side effects for all the medicines you take and that have been prescribed to you. Take any new medicine only after you have completely understood and accepted all the possible adverse reactions/side effects.  Wear Seat belts while driving. You were cared for by a hospitalist during your hospital stay. If you have any questions about your discharge medications or the care you received while you were in the hospital after you are discharged, you can call the unit and ask to speak with the  hospitalist or the covering physician. Once you are discharged, your primary care physician will handle any further medical issues. Please note that NO REFILLS for any discharge medications will be authorized once you are discharged, as it is imperative that you return to your primary care physician (or establish a relationship with a primary care physician if you do not have one).   Discharge wound care:   Complete by: As directed    Increase activity slowly   Complete by: As directed        Discharge Medications:   Allergies as of 06/08/2022       Reactions   Aspirin    GI bleed x2   Hydrocodone Shortness Of Breath   Latuda [lurasidone Hcl] Anaphylaxis   Magnesium-containing Compounds Anaphylaxis, Other (See Comments)   Tolerated Ensure   Prednisone Anaphylaxis, Swelling, Other (See Comments)   Tongue swelling, lip swelling, throat swelling, per pt    Tramadol Anaphylaxis, Swelling, Rash   Codeine Nausea And Vomiting, Rash   Trazodone Other (See Comments)   paranoia   Peanut-containing Drug Products Hives   Raw peanuts, but can eat peanut butter   Topamax [topiramate] Other (See Comments)   Increases paranoia   Sulfa Antibiotics Itching   Tape Rash   Zetia [ezetimibe] Itching, Rash        Medication List     STOP taking these medications    Capsaicin 0.033 % Crea   dicyclomine 10 MG capsule Commonly known as: BENTYL   DULoxetine 30 MG capsule Commonly known as: CYMBALTA   loperamide 2 MG capsule Commonly known as: IMODIUM   Potassium Chloride ER 20 MEQ Tbcr   Saphris 5 MG Subl 24 hr tablet Generic drug: asenapine   torsemide 20 MG tablet Commonly known as: DEMADEX  TAKE these medications    albuterol 108 (90 Base) MCG/ACT inhaler Commonly known as: VENTOLIN HFA Inhale 2 puffs into the lungs every 6 (six) hours as needed for wheezing or shortness of breath.   amLODipine 10 MG tablet Commonly known as: NORVASC Take 10 mg by mouth daily.    atorvastatin 20 MG tablet Commonly known as: LIPITOR Take 1 tablet (20 mg total) by mouth daily.   carvedilol 12.5 MG tablet Commonly known as: COREG Take 1 tablet (12.5 mg total) by mouth 2 (two) times daily.   cyanocobalamin 1000 MCG tablet Take 1 tablet (1,000 mcg total) by mouth daily. Start taking on: Jun 09, 2022   cyclobenzaprine 10 MG tablet Commonly known as: FLEXERIL Take 10 mg by mouth 3 (three) times daily as needed for muscle spasms.   escitalopram 20 MG tablet Commonly known as: LEXAPRO Take 1 tablet (20 mg total) by mouth daily.   ferrous sulfate 325 (65 FE) MG tablet Take 1 tablet (325 mg total) by mouth daily with breakfast. Start taking on: Jun 09, 2022   folic acid 1 MG tablet Commonly known as: FOLVITE Take 1 tablet (1 mg total) by mouth daily. Start taking on: Jun 09, 2022   lamoTRIgine 25 MG tablet Commonly known as: LAMICTAL Take 1 tablet (25 mg total) by mouth 2 (two) times daily. What changed: how much to take   melatonin 3 MG Tabs tablet Take 1 tablet (3 mg total) by mouth at bedtime.   montelukast 10 MG tablet Commonly known as: SINGULAIR Take 10 mg by mouth at bedtime.   OLANZapine 5 MG tablet Commonly known as: ZYPREXA Take 1 tablet (5 mg total) by mouth daily.   omeprazole 40 MG capsule Commonly known as: PRILOSEC Take 1 capsule (40 mg total) by mouth every morning.   oxyCODONE-acetaminophen 5-325 MG tablet Commonly known as: PERCOCET/ROXICET Take 1 tablet by mouth every 8 (eight) hours as needed for up to 5 days for severe pain or moderate pain.   prazosin 2 MG capsule Commonly known as: MINIPRESS TAKE 1 CAPSULE(2 MG) BY MOUTH AT BEDTIME What changed:  how much to take how to take this when to take this additional instructions   pregabalin 50 MG capsule Commonly known as: LYRICA Take 1 capsule (50 mg total) by mouth 3 (three) times daily. What changed: how much to take   Trelegy Ellipta 100-62.5-25 MCG/ACT  Aepb Generic drug: Fluticasone-Umeclidin-Vilant INHALE 1 PUFF INTO THE LUNGS DAILY               Durable Medical Equipment  (From admission, onward)           Start     Ordered   06/05/22 1514  For home use only DME Trapeze  Once       Question:  Length of Need  Answer:  12 Months   06/05/22 1514              Discharge Care Instructions  (From admission, onward)           Start     Ordered   06/08/22 0000  Discharge wound care:        06/08/22 1337             The results of significant diagnostics from this hospitalization (including imaging, microbiology, ancillary and laboratory) are listed below for reference.    Procedures and Diagnostic Studies:   DG HIPS BILAT WITH PELVIS 2V  Result Date: 06/03/2022 CLINICAL DATA:  Pain  in hips, fall EXAM: DG HIP (WITH OR WITHOUT PELVIS) 2V BILAT COMPARISON:  None Available. FINDINGS: There is no evidence of hip fracture or dislocation. There is no evidence of arthropathy or other focal bone abnormality. IMPRESSION: Negative. Electronically Signed   By: Charlett Nose M.D.   On: 06/03/2022 09:59   DG CHEST PORT 1 VIEW  Result Date: 06/02/2022 CLINICAL DATA:  Shortness of breath EXAM: PORTABLE CHEST 1 VIEW COMPARISON:  03/02/2022 FINDINGS: Cardiomegaly with mild central congestion. Possible patchy right mid and lower pulmonary opacities. Multiple old left-sided rib fractures. No pleural effusion or pneumothorax IMPRESSION: 1. Cardiomegaly with mild central congestion. 2. Possible patchy right mid and lower lung airspace disease which may be due to early pneumonia Electronically Signed   By: Jasmine Pang M.D.   On: 06/02/2022 18:16   CT Head Wo Contrast  Result Date: 06/02/2022 CLINICAL DATA:  Head trauma, abnormal mental status, fall EXAM: CT HEAD WITHOUT CONTRAST TECHNIQUE: Contiguous axial images were obtained from the base of the skull through the vertex without intravenous contrast. RADIATION DOSE REDUCTION:  This exam was performed according to the departmental dose-optimization program which includes automated exposure control, adjustment of the mA and/or kV according to patient size and/or use of iterative reconstruction technique. COMPARISON:  CT head 07/09/2021 FINDINGS: Brain: No intracranial hemorrhage, mass effect, or evidence of acute infarct. No hydrocephalus. No extra-axial fluid collection. Generalized cerebral atrophy. Ill-defined hypoattenuation within the cerebral white matter is nonspecific but consistent with chronic small vessel ischemic disease. Vascular: No hyperdense vessel. Intracranial arterial calcification. Skull: No fracture or focal lesion. Sinuses/Orbits: No acute finding. Paranasal sinuses and mastoid air cells are well aerated. Other: None. IMPRESSION: 1. No evidence of acute intracranial abnormality. 2. Atrophy and chronic small vessel ischemic disease. Electronically Signed   By: Minerva Fester M.D.   On: 06/02/2022 17:51     Labs:   Basic Metabolic Panel: Recent Labs  Lab 06/02/22 1700 06/02/22 1843 06/04/22 0206 06/05/22 0153 06/06/22 0757 06/07/22 0823 06/08/22 0154  NA 139   < > 140 136 139 138 139  K 4.9   < > 4.7 4.2 3.9 3.4* 3.6  CL 107   < > 112* 109 109 108 107  CO2 22   < > 21* 20* 22 21* 24  GLUCOSE 83   < > 90 89 85 108* 99  BUN 48*   < > 54* 54* 54* 63* 66*  CREATININE 4.73*   < > 4.55* 4.13* 4.17* 4.33* 4.60*  CALCIUM 8.9   < > 8.1* 8.4* 8.5* 8.5* 8.6*  MG 2.3  --   --   --   --   --   --   PHOS  --   --   --   --   --  4.6  --    < > = values in this interval not displayed.   GFR Estimated Creatinine Clearance: 14 mL/min (A) (by C-G formula based on SCr of 4.6 mg/dL (H)). Liver Function Tests: Recent Labs  Lab 06/02/22 1700 06/07/22 0823  AST 18  --   ALT 9  --   ALKPHOS 137*  --   BILITOT 0.4  --   PROT 6.6  --   ALBUMIN 3.5 2.6*   No results for input(s): "LIPASE", "AMYLASE" in the last 168 hours. Recent Labs  Lab 06/02/22 1700   AMMONIA <10   Coagulation profile No results for input(s): "INR", "PROTIME" in the last 168 hours.  CBC: Recent  Labs  Lab 06/02/22 1700 06/02/22 1843 06/03/22 0220 06/04/22 0206 06/05/22 0153 06/06/22 0757 06/08/22 0154  WBC 4.6  --  3.9* 3.3* 3.6* 3.0* 3.3*  NEUTROABS 3.1  --   --  1.7 1.8 1.2* 1.5*  HGB 8.3*   < > 7.8* 8.0* 8.4* 8.4* 7.6*  HCT 27.7*   < > 25.8* 26.2* 27.9* 28.1* 24.9*  MCV 90.8  --  88.7 90.7 90.6 89.8 88.6  PLT 204  --  209 206 200 199 188   < > = values in this interval not displayed.   Cardiac Enzymes: No results for input(s): "CKTOTAL", "CKMB", "CKMBINDEX", "TROPONINI" in the last 168 hours. BNP: Invalid input(s): "POCBNP" CBG: No results for input(s): "GLUCAP" in the last 168 hours. D-Dimer No results for input(s): "DDIMER" in the last 72 hours. Hgb A1c No results for input(s): "HGBA1C" in the last 72 hours. Lipid Profile No results for input(s): "CHOL", "HDL", "LDLCALC", "TRIG", "CHOLHDL", "LDLDIRECT" in the last 72 hours. Thyroid function studies No results for input(s): "TSH", "T4TOTAL", "T3FREE", "THYROIDAB" in the last 72 hours.  Invalid input(s): "FREET3" Anemia work up No results for input(s): "VITAMINB12", "FOLATE", "FERRITIN", "TIBC", "IRON", "RETICCTPCT" in the last 72 hours. Microbiology Recent Results (from the past 240 hour(s))  Urine Culture     Status: None   Collection Time: 06/02/22  4:50 PM   Specimen: Urine, Catheterized  Result Value Ref Range Status   Specimen Description URINE, CATHETERIZED  Final   Special Requests NONE  Final   Culture   Final    NO GROWTH Performed at [facility], [address], [place], [place] [address]    Report Status 06/03/2022 FINAL  Final  Resp panel by RT-PCR (RSV, Flu A&B, Covid) Anterior Nasal Swab     Status: None   Collection Time: 06/02/22  5:17 PM   Specimen: Anterior Nasal Swab  Result Value Ref Range Status   SARS Coronavirus 2 by RT PCR NEGATIVE NEGATIVE Final    Influenza A by PCR NEGATIVE NEGATIVE Final   Influenza B by PCR NEGATIVE NEGATIVE Final    Comment: (NOTE) The Xpert Xpress SARS-CoV-2/FLU/RSV plus assay is intended as an aid in the diagnosis of influenza from Nasopharyngeal swab specimens and should not be used as a sole basis for treatment. Nasal washings and aspirates are unacceptable for Xpert Xpress SARS-CoV-2/FLU/RSV testing.  Fact Sheet for Patients: BloggerCourse.com  Fact Sheet for Healthcare Providers: SeriousBroker.it  This test is not yet approved or cleared by the [place] FDA and has been authorized for detection and/or diagnosis of SARS-CoV-2 by FDA under an Emergency Use Authorization (EUA). This EUA will remain in effect (meaning this test can be used) for the duration of the COVID-19 declaration under Section 564(b)(1) of the Act, 21 U.S.C. section 360bbb-3(b)(1), unless the authorization is terminated or revoked.     Resp Syncytial Virus by PCR NEGATIVE NEGATIVE Final    Comment: (NOTE) Fact Sheet for Patients: BloggerCourse.com  Fact Sheet for Healthcare Providers: SeriousBroker.it  This test is not yet approved or cleared by the [place] FDA and has been authorized for detection and/or diagnosis of SARS-CoV-2 by FDA under an Emergency Use Authorization (EUA). This EUA will remain in effect (meaning this test can be used) for the duration of the COVID-19 declaration under Section 564(b)(1) of the Act, 21 U.S.C. section 360bbb-3(b)(1), unless the authorization is terminated or revoked.  Performed at [facility], [address], [place], [place] [address]  Time coordinating discharge: 45 minutes  Signed: Yoseph Haile  [facility] 06/08/2022, 1:37 PM

## 2022-06-08 NOTE — Progress Notes (Signed)
Patient refused [facility]AR for transportation.Transportation provided by friend per patient request. By left via wheelchair in no distress at 1520.

## 2022-06-08 NOTE — Progress Notes (Addendum)
[facility] Progress Note   62 y.o. female with CKD, schizoaffective disorder, COPD, morbid obesity, obstructive sleep apnea, type II diabetes mellitus, HTN, HLD, CASHD, h/o cardiac arrest and anoxic brain jury, history of cocaine use (last use ~2 years ago) admitted to [facility] 5/17 with acute on chronic renal failure. Prior to admission patient reports she was feeling generally weak, not eating or drinking at her baseline. Patient had a reported mechanical fall and was found on the ground by her therapist, who called 911. She states she was only on the ground for 10-20 minutes. On arrival sCr 4.73. She was given intravenous fluids with mild improvement of her renal function, this has now stabilized out with sCr 4.13 and nephrology consulted for further assistance.     Assessment/ Plan:   Acute kidney injury in setting of chronic renal disease stage IV: Overall, her renal function has been worsening steadily over the ; of note she was in the 1.2-1.8 range as recently as 09/19/21 and then now consistently in the m2.6-3 range earlier 2024 with progression into the 4's in 05/2022. [name] Patient chronically uses diuretics for her lower extremity edema, although notes as of late the diuretics have not been working as well. She denies any recent fevers, but reports she is consistently chilled. Denies any nausea, vomiting, cough, hemoptysis, abdominal pain, constipation, diarrhea, dysuria, change in urine color, recent night sweats, weight loss, IV drug use. Patient last used cocaine roughly two years ago and has not been sexually active in the last six years. Renal function initially responded to IV fluids, but I do not think she would benefit from further fluids given she is showing signs of hypervolemia on exam. Initial UA with significant proteinuria, hemoglobinuria without casts noted. Albumin low-normal at 3.5, calcium low-normal at 8.9.   - This morning appears close to euvolemic, swelling has  improved. - Good urine output with lasix yesterday, net neg 1.2L out.  - No indication for emergent dialysis.  - sCr jumped to 4.6, would hold further IV lasix.  - Needs close follow-up with nephrology in outpatient setting.   Chronic anemia: Appears to be at baseline, no signs or symptoms of acute bleeding. Likely 2/2 renal disease. Will dose with ESA. WBC and temps don't suggest overwhelming infection but repeat urinalysis suggests possible UTI. [name] asymp.   COPD/OSA: On bronchodilators, sating well on room air currently.  Chronic diastolic heart failure: Gave a dose of Lasix on 5/20 and appears she had a very good response. CAD with history of cardiac arrest: Per primary Type II diabetes mellitus: Sugars at goal, last A1c at goal.   Subjective:   No acute changes. Breathing improved. Legs hurt but swelling improved. No fevers, chills, nausea, vomiting, abdominal pain, changes in urine/bowels.    Objective:   BP 124/76 (BP Location: Left Arm)   Pulse (!) 51   Temp 97.8 F (36.6 C) (Oral)   Resp 17   Ht 5\' 4"  (1.626 m)   Wt 95 kg   SpO2 97%   BMI 35.95 kg/m   Intake/Output Summary (Last 24 hours) at 06/08/2022 0805 Last data filed at 06/08/2022 0500 Gross per 24 hour  Intake 1000 ml  Output 2225 ml  Net -1225 ml    Weight change:   Physical Exam: General: Chronically ill-appearing person in no acute distress HEENT: NCAT Eyes: Vision grossly intact. Heart: Regular rate, rhythm. No murmurs. Warm extremities.  Lungs: Normal work of breathing on room air. Clear to auscultation  bilaterally  Abdomen: Soft, non-tender, non-distended.  Extremities: Trace pitting edema in thighs bilaterally with knee-high compression stockings on . Skin: Warm, dry. No rashes, lesions, petechiae appreciated.  Neuro: Awake, alert, conversing appropriately. Grossly non-focal.   Imaging: ECHOCARDIOGRAM COMPLETE  Result Date: 06/06/2022    ECHOCARDIOGRAM REPORT   Patient Name:   Tiffany [name]  [facility] Date of Exam: 06/06/2022 Medical Rec #:  [id number]       Height:       64.0 in Accession #:    [id number]      Weight:       209.4 lb Date of Birth:  [birth year]       BSA:          1.995 m Patient Age:    62 years        BP:           118/71 mmHg Patient Gender: F               HR:           64 bpm. Exam Location:  Inpatient Procedure: 2D Echo, Cardiac Doppler and Color Doppler Indications:    CHF - Acute Diastolic  History:        Patient has prior history of Echocardiogram examinations, most                 recent 02/04/2022. CHF, CAD, CKD, COPD, Carotid Disease and                 Stroke, Signs/Symptoms:Edema, Chest Pain, Syncope and Dyspnea;                 Risk Factors:Diabetes, Hypertension, Dyslipidemia, cocaine                 abuse, Former Smoker and Sleep Apnea.  Sonographer:    Wallie Char Referring Phys: [id number] Len Blalock LIN IMPRESSIONS  1. Left ventricular ejection fraction, by estimation, is 60 to 65%. The left ventricle has normal function. The left ventricle has no regional wall motion abnormalities. Left ventricular diastolic parameters are consistent with Grade I diastolic dysfunction (impaired relaxation). Elevated left atrial pressure.  2. Right ventricular systolic function is normal. The right ventricular size is normal.  3. Left atrial size was moderately dilated.  4. Right atrial size was moderately dilated.  5. The mitral valve is normal in structure. Mild mitral valve regurgitation.  6. The aortic valve is tricuspid. Aortic valve regurgitation is not visualized. No aortic stenosis is present. Comparison(s): No significant change from prior study. Prior images reviewed side by side. Although estimated mean left atrial pressure is still elevated, it is less severe than on the 02/04/2022 study. Findings suggest high output heart failure (consider anemia, thyrotoxicosis, AF fistula, etc.). FINDINGS  Left Ventricle: Left ventricular ejection fraction, by estimation, is 60 to 65%. The left  ventricle has normal function. The left ventricle has no regional wall motion abnormalities. The left ventricular internal cavity size was normal in size. There is  no left ventricular hypertrophy. Left ventricular diastolic parameters are consistent with Grade I diastolic dysfunction (impaired relaxation). Elevated left atrial pressure. Right Ventricle: The right ventricular size is normal. No increase in right ventricular wall thickness. Right ventricular systolic function is normal. Left Atrium: Left atrial size was moderately dilated. Right Atrium: Right atrial size was moderately dilated. Pericardium: Trivial pericardial effusion is present. Mitral Valve: The mitral valve is normal in structure. Mild mitral valve regurgitation. MV peak gradient, 7.2 mmHg. The  mean mitral valve gradient is 3.0 mmHg. Tricuspid Valve: The tricuspid valve is normal in structure. Tricuspid valve regurgitation is mild. Aortic Valve: Aortic valve gradients are exaggerated by high cardiac output. The aortic valve is tricuspid. Aortic valve regurgitation is not visualized. No aortic stenosis is present. Aortic valve mean gradient measures 10.0 mmHg. Aortic valve peak gradient measures 20.7 mmHg. Aortic valve area, by VTI measures 2.24 cm. Pulmonic Valve: The pulmonic valve was grossly normal. Pulmonic valve regurgitation is not visualized. Aorta: The aortic root and ascending aorta are structurally normal, with no evidence of dilitation. IAS/Shunts: No atrial level shunt detected by color flow Doppler.  LEFT VENTRICLE PLAX 2D LVIDd:         5.40 cm      Diastology LVIDs:         3.50 cm      LV e' medial:    5.73 cm/s LV PW:         1.10 cm      LV E/e' medial:  20.2 LV IVS:        0.90 cm      LV e' lateral:   8.56 cm/s LVOT diam:     1.90 cm      LV E/e' lateral: 13.6 LV SV:         108 LV SV Index:   54 LVOT Area:     2.84 cm  LV Volumes (MOD) LV vol d, MOD A2C: 126.0 ml LV vol d, MOD A4C: 157.0 ml LV vol s, MOD A2C: 49.5 ml LV vol  s, MOD A4C: 60.0 ml LV SV MOD A2C:     76.5 ml LV SV MOD A4C:     157.0 ml LV SV MOD BP:      85.0 ml RIGHT VENTRICLE             IVC RV Basal diam:  4.60 cm     IVC diam: 2.40 cm RV S prime:     17.30 cm/s TAPSE (M-mode): 2.6 cm LEFT ATRIUM              Index        RIGHT ATRIUM           Index LA diam:        4.90 cm  2.46 cm/m   RA Area:     25.10 cm LA Vol (A2C):   103.0 ml 51.63 ml/m  RA Volume:   81.30 ml  40.75 ml/m LA Vol (A4C):   92.8 ml  46.51 ml/m LA Biplane Vol: 101.0 ml 50.62 ml/m  AORTIC VALVE AV Area (Vmax):    2.18 cm AV Area (Vmean):   2.30 cm AV Area (VTI):     2.24 cm AV Vmax:           227.50 cm/s AV Vmean:          149.500 cm/s AV VTI:            0.483 m AV Peak Grad:      20.7 mmHg AV Mean Grad:      10.0 mmHg LVOT Vmax:         175.00 cm/s LVOT Vmean:        121.500 cm/s LVOT VTI:          0.382 m LVOT/AV VTI ratio: 0.79  AORTA Ao Root diam: 3.00 cm Ao Asc diam:  3.60 cm MITRAL VALVE  TRICUSPID VALVE MV Area (PHT): 2.71 cm     TR Peak grad:   36.7 mmHg MV Area VTI:   2.82 cm     TR Vmax:        303.00 cm/s MV Peak grad:  7.2 mmHg MV Mean grad:  3.0 mmHg     SHUNTS MV Vmax:       1.34 m/s     Systemic VTI:  0.38 m MV Vmean:      79.0 cm/s    Systemic Diam: 1.90 cm MV Decel Time: 280 msec MV E velocity: 116.00 cm/s MV A velocity: 130.00 cm/s MV E/A ratio:  0.89 Mihai Croitoru MD Electronically signed by Thurmon Fair MD Signature Date/Time: 06/06/2022/3:28:20 PM    Final     Labs: BMET Recent Labs  Lab 06/02/22 1700 06/02/22 1843 06/03/22 0220 06/04/22 0206 06/05/22 0153 06/06/22 0757 06/07/22 0823 06/08/22 0154  NA 139 141 140 140 136 139 138 139  K 4.9 5.0 4.2 4.7 4.2 3.9 3.4* 3.6  CL 107  --  110 112* 109 109 108 107  CO2 22  --  21* 21* 20* 22 21* 24  GLUCOSE 83  --  93 90 89 85 108* 99  BUN 48*  --  48* 54* 54* 54* 63* 66*  CREATININE 4.73*  --  4.51* 4.55* 4.13* 4.17* 4.33* 4.60*  CALCIUM 8.9  --  8.3* 8.1* 8.4* 8.5* 8.5* 8.6*  PHOS  --   --    --   --   --   --  4.6  --     CBC Recent Labs  Lab 06/04/22 0206 06/05/22 0153 06/06/22 0757 06/08/22 0154  WBC 3.3* 3.6* 3.0* 3.3*  NEUTROABS 1.7 1.8 1.2* 1.5*  HGB 8.0* 8.4* 8.4* 7.6*  HCT 26.2* 27.9* 28.1* 24.9*  MCV 90.7 90.6 89.8 88.6  PLT 206 200 199 188     Medications:     amLODipine  10 mg Oral Daily   atorvastatin  20 mg Oral Daily   carvedilol  12.5 mg Oral BID WC   vitamin B-12  1,000 mcg Oral Daily   escitalopram  20 mg Oral Daily   fluticasone furoate-vilanterol  1 puff Inhalation Daily   And   umeclidinium bromide  1 puff Inhalation Daily   folic acid  1 mg Oral Daily   heparin  5,000 Units Subcutaneous Q8H   lamoTRIgine  100 mg Oral BID   melatonin  3 mg Oral QHS   montelukast  10 mg Oral QHS   OLANZapine  5 mg Oral QHS   prazosin  2 mg Oral QHS   pregabalin  50 mg Oral TID    Evlyn Kanner, MD 06/08/2022, 8:05 AM

## 2022-06-08 NOTE — Progress Notes (Signed)
Working with patient on discharge home, patient stated she was having a hard time getting in touch with someone to pick her up. I reviewed AVS with her and she verbalized understanding. Case management and primary RN made aware of transportation issue.

## 2022-06-08 NOTE — Care Management Important Message (Signed)
Important Message  Patient Details  Name: Tiffany [name] MRN: [id number] Date of Birth: Jul 18, 1960   Medicare Important Message Given:  Yes     Dorena Bodo 06/08/2022, 2:52 PM

## 2022-06-09 ENCOUNTER — Ambulatory Visit (INDEPENDENT_AMBULATORY_CARE_PROVIDER_SITE_OTHER): Payer: 59 | Admitting: Family Medicine

## 2022-06-09 ENCOUNTER — Encounter: Payer: Self-pay | Admitting: Family Medicine

## 2022-06-09 ENCOUNTER — Telehealth: Payer: Self-pay

## 2022-06-09 ENCOUNTER — Ambulatory Visit: Payer: 59 | Admitting: Family Medicine

## 2022-06-09 VITALS — BP 126/72 | HR 74 | Ht 64.0 in | Wt 215.6 lb

## 2022-06-09 DIAGNOSIS — R600 Localized edema: Secondary | ICD-10-CM

## 2022-06-09 DIAGNOSIS — I509 Heart failure, unspecified: Secondary | ICD-10-CM | POA: Diagnosis not present

## 2022-06-09 DIAGNOSIS — N179 Acute kidney failure, unspecified: Secondary | ICD-10-CM | POA: Diagnosis not present

## 2022-06-09 MED ORDER — TORSEMIDE 20 MG PO TABS
40.0000 mg | ORAL_TABLET | Freq: Every day | ORAL | 0 refills | Status: DC
Start: 2022-06-09 — End: 2022-06-13

## 2022-06-09 NOTE — Transitions of Care (Post Inpatient/ED Visit) (Signed)
   06/09/2022  Name: [name] MRN: [id number] DOB: [birth year]  Today's TOC FU Call Status: Today's TOC FU Call Status:: Unsuccessul Call (1st Attempt) Unsuccessful Call (1st Attempt) Date: 06/09/22  Attempted to reach the patient regarding the most recent Inpatient/ED visit.  Follow Up Plan: Additional outreach attempts will be made to reach the patient to complete the Transitions of Care (Post Inpatient/ED visit) call.   Jodelle Gross, RN, BSN, CCM Care Management Coordinator /[facility]

## 2022-06-09 NOTE — Assessment & Plan Note (Signed)
-  likely secondary to CHF, recent echo on 5/21 notable for EG 60-65% with grade 1 diastolic dysfunction, mild mitral regurgitation without evidence of aortic stenosis and regurgitation. Bilateral edema likely causing the pain and caused her fall earlier.  -torsemide 40 mg daily with close monitoring of renal function, patient to see nephrology outpatient -med rec reviewed -strict ED precautions discussed  -close follow up scheduled on 5/28, plan to obtain BMP at that time to monitor renal function

## 2022-06-09 NOTE — Transitions of Care (Post Inpatient/ED Visit) (Signed)
06/09/2022  Name: Tiffany [name] MRN: [id number] DOB: 07/07/1960  Today's TOC FU Call Status: Today's TOC FU Call Status:: Successful TOC FU Call Competed TOC FU Call Complete Date: 06/09/22  Transition Care Management Follow-up Telephone Call Date of Discharge: 06/08/22 Discharge Facility: [facility]) Type of Discharge: Inpatient Admission Primary Inpatient Discharge Diagnosis:: Acute Kidney Injury on Chronic Kidney Disease IV How have you been since you were released from the hospital?: Same (My legs are swollen and I had a fall last night) Any questions or concerns?: Yes Patient Questions/Concerns:: Patients legs are swollen and she had a fall last night Patient Questions/Concerns Addressed: Notified Provider of Patient Questions/Concerns (Patient to see provider this afternoon)  Items Reviewed: Did you receive and understand the discharge instructions provided?: Yes Medications obtained,verified, and reconciled?: Yes (Medications Reviewed) Any new allergies since your discharge?: No Dietary orders reviewed?: No Do you have support at home?: Yes People in Home: parent(s) Name of Support/Comfort Primary Source: Elderly Mother  Medications Reviewed Today: Medications Reviewed Today     Reviewed by Jodelle Gross, RN (Case Manager) on 06/09/22 at 1125  Med List Status: <None>   Medication Order Taking? Sig Documenting Provider Last Dose Status Informant  albuterol (VENTOLIN HFA) 108 (90 Base) MCG/ACT inhaler [id number] Yes Inhale 2 puffs into the lungs every 6 (six) hours as needed for wheezing or shortness of breath. Tiffany Kocher, DO Taking Active Self, Pharmacy Records  amLODipine (NORVASC) 10 MG tablet [id number] Yes Take 10 mg by mouth daily. [provider] Taking Active   atorvastatin (LIPITOR) 20 MG tablet [id number] Yes Take 1 tablet (20 mg total) by mouth daily. Tiffany Kocher, DO Taking Active Self, Pharmacy Records  carvedilol (COREG) 12.5 MG tablet  [id number] Yes Take 1 tablet (12.5 mg total) by mouth 2 (two) times daily. Pricilla Riffle, MD Taking Active Self, Pharmacy Records  cyanocobalamin 1000 MCG tablet [id number] Yes Take 1 tablet (1,000 mcg total) by mouth daily. Lorin Glass, MD Taking Active   cyclobenzaprine (FLEXERIL) 10 MG tablet [id number] Yes Take 10 mg by mouth 3 (three) times daily as needed for muscle spasms. [provider] Taking Active Self, Pharmacy Records  escitalopram (LEXAPRO) 20 MG tablet [id number] Yes Take 1 tablet (20 mg total) by mouth daily. Tiffany Kocher, DO Taking Active Self, Pharmacy Records  ferrous sulfate 325 (65 FE) MG tablet [id number] Yes Take 1 tablet (325 mg total) by mouth daily with breakfast. Lorin Glass, MD Taking Active   Fluticasone-Umeclidin-Vilant (TRELEGY ELLIPTA) 100-62.5-25 MCG/ACT AEPB [id number] Yes INHALE 1 PUFF INTO THE LUNGS DAILY Tiffany Kocher, DO Taking Active Self, Pharmacy Records  folic acid (FOLVITE) 1 MG tablet [id number] No Take 1 tablet (1 mg total) by mouth daily. Lorin Glass, MD Unknown Active   lamoTRIgine (LAMICTAL) 25 MG tablet [id number] Yes Take 1 tablet (25 mg total) by mouth 2 (two) times daily.  Patient taking differently: Take 100 mg by mouth 2 (two) times daily.   Tiffany Kocher, DO Taking Active Self, Pharmacy Records  melatonin 3 MG TABS tablet [id number] No Take 1 tablet (3 mg total) by mouth at bedtime. Tiffany Kocher, DO Unknown Active Self, Pharmacy Records  montelukast (SINGULAIR) 10 MG tablet [id number] Yes Take 10 mg by mouth at bedtime. [provider] Taking Active Self, Pharmacy Records  OLANZapine (ZYPREXA) 5 MG tablet [id number] Yes Take 1 tablet (5 mg total) by mouth daily. Moses Manners, MD Taking Active Self, Pharmacy Records  omeprazole (PRILOSEC) 40 MG capsule [id number] Yes Take  1 capsule (40 mg total) by mouth every morning. Tiffany Kocher, DO Taking Active Self, Pharmacy Records  oxyCODONE-acetaminophen (PERCOCET/ROXICET)  5-325 MG tablet [id number] Yes Take 1 tablet by mouth every 8 (eight) hours as needed for up to 5 days for severe pain or moderate pain. Lorin Glass, MD Taking Active   prazosin (MINIPRESS) 2 MG capsule [id number] Yes TAKE 1 CAPSULE(2 MG) BY MOUTH AT BEDTIME  Patient taking differently: Take 2 mg by mouth 2 (two) times daily.   Tiffany Kocher, DO Taking Active Self, Pharmacy Records  pregabalin (LYRICA) 50 MG capsule [id number]  Take 1 capsule (50 mg total) by mouth 3 (three) times daily.  Patient taking differently: Take 100 mg by mouth 3 (three) times daily.   Tiffany Kocher, DO  Expired 06/04/22 2359 Self, Pharmacy Records  Med List Note Alinda Money, [facility] 05/19/20 1422): ,            Home Care and Equipment/Supplies: Were Home Health Services Ordered?: No Any new equipment or medical supplies ordered?: No  Functional Questionnaire: Do you need assistance with bathing/showering or dressing?: Yes Do you need assistance with meal preparation?: Yes Do you need assistance with eating?: No Do you have difficulty maintaining continence: No Do you need assistance with getting out of bed/getting out of a chair/moving?: Yes Do you have difficulty managing or taking your medications?: No  Follow up appointments reviewed: PCP Follow-up appointment confirmed?: Yes Date of PCP follow-up appointment?: 06/09/22 Follow-up Provider: Jaynee Eagles Specialist [facility] Follow-up appointment confirmed?: NA Do you need transportation to your follow-up appointment?: No Do you understand care options if your condition(s) worsen?: Yes-patient verbalized understanding  SDOH Interventions Today    Flowsheet Row Most Recent Value  SDOH Interventions   Food Insecurity Interventions Intervention Not Indicated  Utilities Interventions Intervention Not Indicated      Interventions Today    Flowsheet Row Most Recent Value  Chronic Disease   Chronic disease during today's visit Congestive  Heart Failure (CHF), Hypertension (HTN), Chronic Kidney Disease/End Stage Renal Disease (ESRD)  General Interventions   General Interventions Discussed/Reviewed General Interventions Discussed, Doctor Visits  Doctor Visits Discussed/Reviewed Doctor Visits Discussed  Safety Interventions   Safety Discussed/Reviewed Safety Discussed, Fall Risk       TOC Interventions Today    Flowsheet Row Most Recent Value  TOC Interventions   TOC Interventions Discussed/Reviewed TOC Interventions Discussed, Arranged PCP follow up within 7 days/Care Guide scheduled       Jodelle Gross, RN, BSN, CCM Care Management Coordinator [facility] Phone: [phone]/Fax: [phone]

## 2022-06-09 NOTE — Progress Notes (Signed)
    SUBJECTIVE:   CHIEF COMPLAINT / HPI:   Patient with history of congestive heart failure presents for concern of bilateral lower extremity swelling that started yesterday. She had a fall yesterday as she was trying to get in her wheelchair. Denies head trauma or other injury. She hit her right leg against the chair accidentally when this occurred. She has noticed swelling on both of her legs since yesterday, it seems to be noticeable even through her socks. Has aide and mother who help her at home. Recent hospitalization for worsening AKI, held torsemide which they continuing to hold on discharge. Denies any worsening dyspnea, orthopnea and chest pain. Feels better since she returned from the hospital. She feels like she cannot use her legs.   OBJECTIVE:   BP 126/72   Pulse 74   Ht 5\' 4"  (1.626 m)   Wt 215 lb 9.6 oz (97.8 kg)   SpO2 95%   BMI 37.01 kg/m   General: Patient well-appearing, in no acute distress.  HEENT: no JVD CV: RRR, no murmurs or gallops auscultated Resp: CTAB, no wheezing, rales or rhonchi noted, breathing comfortably on room air  Ext: 2+ pitting edema noted bilaterally  ASSESSMENT/PLAN:   Bilateral leg edema -likely secondary to CHF, recent echo on 5/21 notable for EG 60-65% with grade 1 diastolic dysfunction, mild mitral regurgitation without evidence of aortic stenosis and regurgitation. Bilateral edema likely causing the pain and caused her fall earlier.  -torsemide 40 mg daily with close monitoring of renal function, patient to see nephrology outpatient -med rec reviewed -strict ED precautions discussed  -close follow up scheduled on 5/28, plan to obtain BMP at that time to monitor renal function      Orlan Aversa Robyne Peers, DO [facility]

## 2022-06-09 NOTE — Patient Instructions (Addendum)
It was great seeing you today!  Today we discussed your leg swelling, this is due to your heart failure. The swelling is causing you pain and increased swelling is what also caused you to fall. I have prescribed torsemide 40 mg daily, please start this today.  If you experience any worsening leg swelling, chest pain and shortness of breath then please go to the emergency department.   Please follow up at your next scheduled appointment on 5/28 at 9:30 am, if anything arises between now and then, please don't hesitate to contact our office.   Thank you for allowing [name] to be a part of your medical care!  Thank you, Dr. Robyne Peers

## 2022-06-09 NOTE — Progress Notes (Deleted)
    SUBJECTIVE:   CHIEF COMPLAINT / HPI:   *** Recently hospitalized 5/17 - 5/23 at [facility] with [facility]. Was seen for AKI on CKD 4 and proteinuria, presented with creatinine 4.73.  Baseline likely less than 3.  Discharged with baseline creatinine 4.6 s/p diuresis x 3 days IV Lasix.  Recommended hold diuretics until she is seen by nephrology outpatient.  Acute metabolic encephalopathy, schizoaffective disorder, anxiety and depression. Patient was somnolent, thought to be due to uremia and morning psych meds.  Mental status improved. Continued on Lexapro, Lamictal, Zyprexa, Lyrica, and Flexeril Cymbalta and so far is on hold Bilateral BLE edema, recommended continue Coreg, amlodipine, prazosin Torsemide on hold because of AKI Ultrasound bilateral negative for duplex, Ted hose as ordered  Home PT ordered  PERTINENT  PMH / PSH: ***  OBJECTIVE:   There were no vitals taken for this visit.  ***  ASSESSMENT/PLAN:   No problem-specific Assessment & Plan notes found for this encounter.     Fayette Pho, MD [facility]

## 2022-06-10 ENCOUNTER — Observation Stay (HOSPITAL_COMMUNITY): Payer: 59

## 2022-06-10 ENCOUNTER — Other Ambulatory Visit: Payer: Self-pay | Admitting: Family Medicine

## 2022-06-10 ENCOUNTER — Emergency Department (HOSPITAL_COMMUNITY): Payer: 59

## 2022-06-10 ENCOUNTER — Encounter (HOSPITAL_COMMUNITY): Payer: Self-pay | Admitting: Emergency Medicine

## 2022-06-10 ENCOUNTER — Other Ambulatory Visit: Payer: Self-pay

## 2022-06-10 ENCOUNTER — Inpatient Hospital Stay (HOSPITAL_COMMUNITY)
Admission: EM | Admit: 2022-06-10 | Discharge: 2022-06-13 | DRG: 917 | Disposition: A | Discharge status: Home Health | Payer: 59 | Attending: Family Medicine | Admitting: Family Medicine

## 2022-06-10 ENCOUNTER — Encounter (HOSPITAL_COMMUNITY): Payer: 59

## 2022-06-10 DIAGNOSIS — Z91010 Allergy to peanuts: Secondary | ICD-10-CM

## 2022-06-10 DIAGNOSIS — E785 Hyperlipidemia, unspecified: Secondary | ICD-10-CM | POA: Diagnosis present

## 2022-06-10 DIAGNOSIS — M545 Low back pain, unspecified: Secondary | ICD-10-CM | POA: Diagnosis not present

## 2022-06-10 DIAGNOSIS — J4489 Other specified chronic obstructive pulmonary disease: Secondary | ICD-10-CM | POA: Diagnosis not present

## 2022-06-10 DIAGNOSIS — G4733 Obstructive sleep apnea (adult) (pediatric): Secondary | ICD-10-CM | POA: Diagnosis present

## 2022-06-10 DIAGNOSIS — D631 Anemia in chronic kidney disease: Secondary | ICD-10-CM | POA: Diagnosis present

## 2022-06-10 DIAGNOSIS — R4182 Altered mental status, unspecified: Secondary | ICD-10-CM | POA: Diagnosis present

## 2022-06-10 DIAGNOSIS — Z886 Allergy status to analgesic agent status: Secondary | ICD-10-CM | POA: Diagnosis not present

## 2022-06-10 DIAGNOSIS — M79606 Pain in leg, unspecified: Secondary | ICD-10-CM | POA: Diagnosis not present

## 2022-06-10 DIAGNOSIS — R29898 Other symptoms and signs involving the musculoskeletal system: Secondary | ICD-10-CM | POA: Insufficient documentation

## 2022-06-10 DIAGNOSIS — Z882 Allergy status to sulfonamides status: Secondary | ICD-10-CM

## 2022-06-10 DIAGNOSIS — G4710 Hypersomnia, unspecified: Secondary | ICD-10-CM | POA: Diagnosis not present

## 2022-06-10 DIAGNOSIS — I10 Essential (primary) hypertension: Secondary | ICD-10-CM | POA: Diagnosis present

## 2022-06-10 DIAGNOSIS — G629 Polyneuropathy, unspecified: Secondary | ICD-10-CM | POA: Diagnosis not present

## 2022-06-10 DIAGNOSIS — M7989 Other specified soft tissue disorders: Secondary | ICD-10-CM | POA: Diagnosis not present

## 2022-06-10 DIAGNOSIS — E1140 Type 2 diabetes mellitus with diabetic neuropathy, unspecified: Secondary | ICD-10-CM | POA: Diagnosis not present

## 2022-06-10 DIAGNOSIS — N184 Chronic kidney disease, stage 4 (severe): Secondary | ICD-10-CM | POA: Diagnosis not present

## 2022-06-10 DIAGNOSIS — Z8674 Personal history of sudden cardiac arrest: Secondary | ICD-10-CM | POA: Diagnosis not present

## 2022-06-10 DIAGNOSIS — Z888 Allergy status to other drugs, medicaments and biological substances status: Secondary | ICD-10-CM

## 2022-06-10 DIAGNOSIS — F259 Schizoaffective disorder, unspecified: Secondary | ICD-10-CM | POA: Diagnosis present

## 2022-06-10 DIAGNOSIS — R531 Weakness: Secondary | ICD-10-CM

## 2022-06-10 DIAGNOSIS — M25551 Pain in right hip: Secondary | ICD-10-CM | POA: Diagnosis present

## 2022-06-10 DIAGNOSIS — M48061 Spinal stenosis, lumbar region without neurogenic claudication: Secondary | ICD-10-CM | POA: Diagnosis present

## 2022-06-10 DIAGNOSIS — I129 Hypertensive chronic kidney disease with stage 1 through stage 4 chronic kidney disease, or unspecified chronic kidney disease: Secondary | ICD-10-CM | POA: Diagnosis not present

## 2022-06-10 DIAGNOSIS — Z885 Allergy status to narcotic agent status: Secondary | ICD-10-CM | POA: Diagnosis not present

## 2022-06-10 DIAGNOSIS — N189 Chronic kidney disease, unspecified: Secondary | ICD-10-CM | POA: Diagnosis not present

## 2022-06-10 DIAGNOSIS — E1122 Type 2 diabetes mellitus with diabetic chronic kidney disease: Secondary | ICD-10-CM | POA: Diagnosis not present

## 2022-06-10 DIAGNOSIS — Z043 Encounter for examination and observation following other accident: Secondary | ICD-10-CM | POA: Diagnosis not present

## 2022-06-10 DIAGNOSIS — Z743 Need for continuous supervision: Secondary | ICD-10-CM | POA: Diagnosis not present

## 2022-06-10 DIAGNOSIS — Z5329 Procedure and treatment not carried out because of patient's decision for other reasons: Secondary | ICD-10-CM | POA: Diagnosis not present

## 2022-06-10 DIAGNOSIS — I130 Hypertensive heart and chronic kidney disease with heart failure and stage 1 through stage 4 chronic kidney disease, or unspecified chronic kidney disease: Secondary | ICD-10-CM | POA: Diagnosis not present

## 2022-06-10 DIAGNOSIS — T50991A Poisoning by other drugs, medicaments and biological substances, accidental (unintentional), initial encounter: Secondary | ICD-10-CM | POA: Diagnosis not present

## 2022-06-10 DIAGNOSIS — Z993 Dependence on wheelchair: Secondary | ICD-10-CM

## 2022-06-10 DIAGNOSIS — W050XXA Fall from non-moving wheelchair, initial encounter: Secondary | ICD-10-CM | POA: Diagnosis present

## 2022-06-10 DIAGNOSIS — M25559 Pain in unspecified hip: Secondary | ICD-10-CM

## 2022-06-10 DIAGNOSIS — N179 Acute kidney failure, unspecified: Secondary | ICD-10-CM | POA: Diagnosis not present

## 2022-06-10 DIAGNOSIS — Y92009 Unspecified place in unspecified non-institutional (private) residence as the place of occurrence of the external cause: Secondary | ICD-10-CM

## 2022-06-10 DIAGNOSIS — I509 Heart failure, unspecified: Secondary | ICD-10-CM

## 2022-06-10 DIAGNOSIS — F1490 Cocaine use, unspecified, uncomplicated: Secondary | ICD-10-CM | POA: Diagnosis present

## 2022-06-10 DIAGNOSIS — R0602 Shortness of breath: Secondary | ICD-10-CM | POA: Diagnosis not present

## 2022-06-10 DIAGNOSIS — I5033 Acute on chronic diastolic (congestive) heart failure: Secondary | ICD-10-CM | POA: Diagnosis not present

## 2022-06-10 DIAGNOSIS — M549 Dorsalgia, unspecified: Secondary | ICD-10-CM | POA: Diagnosis present

## 2022-06-10 DIAGNOSIS — W19XXXA Unspecified fall, initial encounter: Secondary | ICD-10-CM | POA: Diagnosis not present

## 2022-06-10 DIAGNOSIS — J449 Chronic obstructive pulmonary disease, unspecified: Secondary | ICD-10-CM | POA: Diagnosis present

## 2022-06-10 DIAGNOSIS — M25552 Pain in left hip: Secondary | ICD-10-CM | POA: Diagnosis present

## 2022-06-10 DIAGNOSIS — Z79899 Other long term (current) drug therapy: Secondary | ICD-10-CM

## 2022-06-10 DIAGNOSIS — F419 Anxiety disorder, unspecified: Secondary | ICD-10-CM | POA: Diagnosis present

## 2022-06-10 HISTORY — DX: Melena: K92.1

## 2022-06-10 HISTORY — DX: Hypokalemia: E87.6

## 2022-06-10 HISTORY — DX: Periumbilical pain: R10.33

## 2022-06-10 HISTORY — DX: Unspecified abdominal pain: R10.9

## 2022-06-10 HISTORY — DX: Other symptoms and signs involving appearance and behavior: R46.89

## 2022-06-10 HISTORY — DX: Hypomagnesemia: E83.42

## 2022-06-10 HISTORY — DX: Hypocalcemia: E83.51

## 2022-06-10 LAB — URINALYSIS, ROUTINE W REFLEX MICROSCOPIC
Bilirubin Urine: NEGATIVE
Glucose, UA: NEGATIVE mg/dL
Ketones, ur: NEGATIVE mg/dL
Leukocytes,Ua: NEGATIVE
Nitrite: NEGATIVE
Protein, ur: 100 mg/dL — AB
Specific Gravity, Urine: 1.006 (ref 1.005–1.030)
pH: 6 (ref 5.0–8.0)

## 2022-06-10 LAB — COMPREHENSIVE METABOLIC PANEL
ALT: 12 U/L (ref 0–44)
AST: 18 U/L (ref 15–41)
Albumin: 3.4 g/dL — ABNORMAL LOW (ref 3.5–5.0)
Alkaline Phosphatase: 142 U/L — ABNORMAL HIGH (ref 38–126)
Anion gap: 11 (ref 5–15)
BUN: 75 mg/dL — ABNORMAL HIGH (ref 8–23)
CO2: 24 mmol/L (ref 22–32)
Calcium: 8.8 mg/dL — ABNORMAL LOW (ref 8.9–10.3)
Chloride: 103 mmol/L (ref 98–111)
Creatinine, Ser: 4.78 mg/dL — ABNORMAL HIGH (ref 0.44–1.00)
GFR, Estimated: 10 mL/min — ABNORMAL LOW (ref >60)
Glucose, Bld: 96 mg/dL (ref 70–99)
Potassium: 4 mmol/L (ref 3.5–5.1)
Sodium: 138 mmol/L (ref 135–145)
Total Bilirubin: 0.2 mg/dL — ABNORMAL LOW (ref 0.3–1.2)
Total Protein: 6.5 g/dL (ref 6.5–8.1)

## 2022-06-10 LAB — CBC WITH DIFFERENTIAL/PLATELET
Abs Immature Granulocytes: 0.01 10*3/uL (ref 0.00–0.07)
Basophils Absolute: 0 10*3/uL (ref 0.0–0.1)
Basophils Relative: 1 %
Eosinophils Absolute: 0.2 10*3/uL (ref 0.0–0.5)
Eosinophils Relative: 4 %
HCT: 27.6 % — ABNORMAL LOW (ref 36.0–46.0)
Hemoglobin: 8.1 g/dL — ABNORMAL LOW (ref 12.0–15.0)
Immature Granulocytes: 0 %
Lymphocytes Relative: 21 %
Lymphs Abs: 1.1 10*3/uL (ref 0.7–4.0)
MCH: 27 pg (ref 26.0–34.0)
MCHC: 29.3 g/dL — ABNORMAL LOW (ref 30.0–36.0)
MCV: 92 fL (ref 80.0–100.0)
Monocytes Absolute: 0.5 10*3/uL (ref 0.1–1.0)
Monocytes Relative: 10 %
Neutro Abs: 3.3 10*3/uL (ref 1.7–7.7)
Neutrophils Relative %: 64 %
Platelets: 207 10*3/uL (ref 150–400)
RBC: 3 MIL/uL — ABNORMAL LOW (ref 3.87–5.11)
RDW: 17.4 % — ABNORMAL HIGH (ref 11.5–15.5)
WBC: 5.1 10*3/uL (ref 4.0–10.5)
nRBC: 0 % (ref 0.0–0.2)

## 2022-06-10 LAB — RAPID URINE DRUG SCREEN, HOSP PERFORMED
Amphetamines: NOT DETECTED
Barbiturates: NOT DETECTED
Benzodiazepines: NOT DETECTED
Cocaine: POSITIVE — AB
Opiates: NOT DETECTED
Tetrahydrocannabinol: NOT DETECTED

## 2022-06-10 LAB — TROPONIN I (HIGH SENSITIVITY)
Troponin I (High Sensitivity): 12 ng/L (ref <18)
Troponin I (High Sensitivity): 13 ng/L (ref <18)

## 2022-06-10 LAB — BRAIN NATRIURETIC PEPTIDE: B Natriuretic Peptide: 256 pg/mL — ABNORMAL HIGH (ref 0.0–100.0)

## 2022-06-10 MED ORDER — LORAZEPAM 2 MG/ML IJ SOLN: 0.5000 mg | Freq: Once | INTRAMUSCULAR | Status: DC

## 2022-06-10 MED ORDER — FLUTICASONE FUROATE-VILANTEROL 100-25 MCG/ACT IN AEPB
1.0000 | INHALATION_SPRAY | Freq: Every day | RESPIRATORY_TRACT | Status: DC
  Administered 2022-06-11 – 2022-06-13 (×3): 1 via RESPIRATORY_TRACT
  Filled 2022-06-10: qty 28

## 2022-06-10 MED ORDER — ACETAMINOPHEN 325 MG PO TABS: 650.0000 mg | ORAL_TABLET | Freq: Four times a day (QID) | ORAL | Status: DC | PRN

## 2022-06-10 MED ORDER — FOLIC ACID 1 MG PO TABS
1.0000 mg | ORAL_TABLET | Freq: Every day | ORAL | Status: DC
  Administered 2022-06-11 – 2022-06-13 (×3): 1 mg via ORAL
  Filled 2022-06-10 (×3): qty 1

## 2022-06-10 MED ORDER — LAMOTRIGINE 100 MG PO TABS
100.0000 mg | ORAL_TABLET | Freq: Every day | ORAL | Status: DC
  Administered 2022-06-11 – 2022-06-13 (×3): 100 mg via ORAL
  Filled 2022-06-10 (×3): qty 1

## 2022-06-10 MED ORDER — AMLODIPINE BESYLATE 10 MG PO TABS
10.0000 mg | ORAL_TABLET | Freq: Every day | ORAL | Status: DC
  Administered 2022-06-11 – 2022-06-13 (×3): 10 mg via ORAL
  Filled 2022-06-10 (×3): qty 1

## 2022-06-10 MED ORDER — ALBUTEROL SULFATE (2.5 MG/3ML) 0.083% IN NEBU: 3.0000 mL | INHALATION_SOLUTION | Freq: Four times a day (QID) | RESPIRATORY_TRACT | Status: DC | PRN

## 2022-06-10 MED ORDER — PANTOPRAZOLE SODIUM 40 MG PO TBEC
40.0000 mg | DELAYED_RELEASE_TABLET | Freq: Every day | ORAL | Status: DC
  Administered 2022-06-11 – 2022-06-13 (×3): 40 mg via ORAL
  Filled 2022-06-10 (×3): qty 1

## 2022-06-10 MED ORDER — UMECLIDINIUM BROMIDE 62.5 MCG/ACT IN AEPB: 1.0000 | INHALATION_SPRAY | Freq: Every day | RESPIRATORY_TRACT | Status: DC

## 2022-06-10 MED ORDER — CENTRUM SILVER 50+WOMEN PO TABS: 1.0000 | ORAL_TABLET | Freq: Every day | ORAL | Status: DC

## 2022-06-10 MED ORDER — FLUTICASONE FUROATE-VILANTEROL 100-25 MCG/ACT IN AEPB: 1.0000 | INHALATION_SPRAY | Freq: Every day | RESPIRATORY_TRACT | Status: DC

## 2022-06-10 MED ORDER — LORAZEPAM 2 MG/ML IJ SOLN
0.5000 mg | Freq: Once | INTRAMUSCULAR | Status: AC
  Administered 2022-06-10: 0.5 mg via INTRAVENOUS
  Filled 2022-06-10: qty 1

## 2022-06-10 MED ORDER — OLANZAPINE 10 MG PO TABS
10.0000 mg | ORAL_TABLET | Freq: Every day | ORAL | Status: DC
  Administered 2022-06-11 – 2022-06-12 (×2): 10 mg via ORAL
  Filled 2022-06-10 (×4): qty 1

## 2022-06-10 MED ORDER — ATORVASTATIN CALCIUM 10 MG PO TABS
20.0000 mg | ORAL_TABLET | Freq: Every day | ORAL | Status: DC
  Administered 2022-06-11 – 2022-06-13 (×3): 20 mg via ORAL
  Filled 2022-06-10 (×3): qty 2

## 2022-06-10 MED ORDER — HEPARIN SODIUM (PORCINE) 5000 UNIT/ML IJ SOLN
5000.0000 [IU] | Freq: Three times a day (TID) | INTRAMUSCULAR | Status: DC
  Administered 2022-06-11 – 2022-06-12 (×5): 5000 [IU] via SUBCUTANEOUS
  Filled 2022-06-10 (×5): qty 1

## 2022-06-10 MED ORDER — UMECLIDINIUM BROMIDE 62.5 MCG/ACT IN AEPB
1.0000 | INHALATION_SPRAY | Freq: Every day | RESPIRATORY_TRACT | Status: DC
  Administered 2022-06-11 – 2022-06-13 (×3): 1 via RESPIRATORY_TRACT
  Filled 2022-06-10: qty 7

## 2022-06-10 MED ORDER — PREGABALIN 75 MG PO CAPS
75.0000 mg | ORAL_CAPSULE | Freq: Every day | ORAL | Status: DC
  Administered 2022-06-10 – 2022-06-13 (×4): 75 mg via ORAL
  Filled 2022-06-10 (×4): qty 1

## 2022-06-10 MED ORDER — ADULT MULTIVITAMIN W/MINERALS CH
1.0000 | ORAL_TABLET | Freq: Every day | ORAL | Status: DC
  Administered 2022-06-11 – 2022-06-13 (×3): 1 via ORAL
  Filled 2022-06-10 (×3): qty 1

## 2022-06-10 MED ORDER — FERROUS SULFATE 325 (65 FE) MG PO TABS
325.0000 mg | ORAL_TABLET | Freq: Every day | ORAL | Status: DC
  Administered 2022-06-11 – 2022-06-13 (×3): 325 mg via ORAL
  Filled 2022-06-10 (×3): qty 1

## 2022-06-10 MED ORDER — DICLOFENAC SODIUM 1 % EX GEL
2.0000 g | Freq: Four times a day (QID) | CUTANEOUS | Status: DC | PRN
  Filled 2022-06-10: qty 100

## 2022-06-10 MED ORDER — FUROSEMIDE 10 MG/ML IJ SOLN
40.0000 mg | Freq: Once | INTRAMUSCULAR | Status: AC
  Administered 2022-06-10: 40 mg via INTRAVENOUS
  Filled 2022-06-10: qty 4

## 2022-06-10 MED ORDER — VITAMIN B-12 1000 MCG PO TABS
1000.0000 ug | ORAL_TABLET | Freq: Every day | ORAL | Status: DC
  Administered 2022-06-11 – 2022-06-13 (×3): 1000 ug via ORAL
  Filled 2022-06-10 (×3): qty 1

## 2022-06-10 MED ORDER — PRAZOSIN HCL 2 MG PO CAPS
2.0000 mg | ORAL_CAPSULE | Freq: Every day | ORAL | Status: DC
  Administered 2022-06-11 – 2022-06-12 (×2): 2 mg via ORAL
  Filled 2022-06-10 (×4): qty 1

## 2022-06-10 MED ORDER — CARVEDILOL 12.5 MG PO TABS
12.5000 mg | ORAL_TABLET | Freq: Two times a day (BID) | ORAL | Status: DC
  Administered 2022-06-10 – 2022-06-13 (×6): 12.5 mg via ORAL
  Filled 2022-06-10 (×6): qty 1

## 2022-06-10 MED ORDER — ESCITALOPRAM OXALATE 10 MG PO TABS
10.0000 mg | ORAL_TABLET | Freq: Every day | ORAL | Status: DC
  Administered 2022-06-11 – 2022-06-13 (×3): 10 mg via ORAL
  Filled 2022-06-10 (×3): qty 1

## 2022-06-10 NOTE — ED Notes (Signed)
Pateint is on the bedpan asked several times to get off , states she needs a little more time.

## 2022-06-10 NOTE — Assessment & Plan Note (Signed)
-   cont home lamictal, zyprexa, lexapro

## 2022-06-10 NOTE — Progress Notes (Signed)
Patient arrived to the unit around 1700. BP at this time was 188/95. This RN reached out to Sherrilee Gilles, MD to notify about the BP of 188/95. Sherrilee Gilles, MD stated to monitor and recheck BP per unit protocol.

## 2022-06-10 NOTE — Assessment & Plan Note (Addendum)
Cont home atorvastatin 

## 2022-06-10 NOTE — ED Provider Notes (Signed)
[place] [facility]ERGENCY DEPARTMENT AT [facility] Provider Note   CSN: [id number] Arrival date & time: 06/10/22  1204     History  Chief Complaint  Patient presents with   Weakness    MICHAELANNE BOATWRIGHT is a 62 y.o. female.  Patient with a history of CKD, diabetes, schizoaffective disorder, sleep apnea, wheelchair-bound, COPD, seizures presenting with generalized weakness, leg pain and leg swelling.  States she had a fall from her wheelchair yesterday but denies any injury.  Did not hit her head.  Complains of generalized weakness with increased swelling of her legs over the past several days.  Associate with some shortness of breath.  Nonproductive cough.  No fever.  Some chest pain with coughing only.  No abdominal pain, vomiting or diarrhea.  She was hospitalized about a week ago for volume overload in the setting of renal failure. Her torsemide was held during her hospitalization and restarted by her PCP yesterday.  She feels like her legs are swollen and painful and weak and she is not able to use them to transfer.  The history is provided by the patient and the EMS personnel.  Weakness Associated symptoms: shortness of breath   Associated symptoms: no abdominal pain, no arthralgias, no diarrhea, no dizziness, no dysuria, no fever, no headaches, no myalgias and no vomiting        Home Medications Prior to Admission medications   Medication Sig Start Date End Date Taking? Authorizing Provider  albuterol (VENTOLIN HFA) 108 (90 Base) MCG/ACT inhaler Inhale 2 puffs into the lungs every 6 (six) hours as needed for wheezing or shortness of breath. 01/05/22   Tiffany Kocher, DO  amLODipine (NORVASC) 10 MG tablet Take 10 mg by mouth daily.    [provider]  atorvastatin (LIPITOR) 20 MG tablet Take 1 tablet (20 mg total) by mouth daily. 04/03/22   Tiffany Kocher, DO  carvedilol (COREG) 12.5 MG tablet Take 1 tablet (12.5 mg total) by mouth 2 (two) times daily. 03/07/22    Pricilla Riffle, MD  cyanocobalamin 1000 MCG tablet Take 1 tablet (1,000 mcg total) by mouth daily. 06/09/22 09/07/22  Lorin Glass, MD  cyclobenzaprine (FLEXERIL) 10 MG tablet Take 10 mg by mouth 3 (three) times daily as needed for muscle spasms. 05/22/22   [provider]  escitalopram (LEXAPRO) 20 MG tablet Take 1 tablet (20 mg total) by mouth daily. 04/03/22   Tiffany Kocher, DO  ferrous sulfate 325 (65 FE) MG tablet Take 1 tablet (325 mg total) by mouth daily with breakfast. 06/09/22 09/07/22  Lorin Glass, MD  Fluticasone-Umeclidin-Vilant (TRELEGY ELLIPTA) 100-62.5-25 MCG/ACT AEPB INHALE 1 PUFF INTO THE LUNGS DAILY 03/20/22   Tiffany Kocher, DO  folic acid (FOLVITE) 1 MG tablet Take 1 tablet (1 mg total) by mouth daily. 06/09/22 09/07/22  Lorin Glass, MD  lamoTRIgine (LAMICTAL) 25 MG tablet Take 1 tablet (25 mg total) by mouth 2 (two) times daily. Patient taking differently: Take 100 mg by mouth 2 (two) times daily. 05/17/22   Tiffany Kocher, DO  melatonin 3 MG TABS tablet Take 1 tablet (3 mg total) by mouth at bedtime. 04/03/22   Tiffany Kocher, DO  montelukast (SINGULAIR) 10 MG tablet Take 10 mg by mouth at bedtime. 10/16/18   [provider]  OLANZapine (ZYPREXA) 5 MG tablet Take 1 tablet (5 mg total) by mouth daily. 02/27/22   Moses Manners, MD  omeprazole (PRILOSEC) 40 MG capsule Take 1 capsule (40 mg total) by mouth every morning.  04/03/22   Tiffany Kocher, DO  oxyCODONE-acetaminophen (PERCOCET/ROXICET) 5-325 MG tablet Take 1 tablet by mouth every 8 (eight) hours as needed for up to 5 days for severe pain or moderate pain. 06/08/22 06/13/22  Lorin Glass, MD  prazosin (MINIPRESS) 2 MG capsule TAKE 1 CAPSULE(2 MG) BY MOUTH AT BEDTIME Patient taking differently: Take 2 mg by mouth 2 (two) times daily. 05/17/22   Tiffany Kocher, DO  pregabalin (LYRICA) 50 MG capsule Take 1 capsule (50 mg total) by mouth 3 (three) times daily. Patient taking differently: Take 100 mg by mouth 3  (three) times daily. 04/24/22 06/04/22  Tiffany Kocher, DO  torsemide (DEMADEX) 20 MG tablet Take 2 tablets (40 mg total) by mouth daily. 06/09/22   Reece Leader, DO      Allergies    Aspirin, Hydrocodone, Latuda [lurasidone hcl], Magnesium-containing compounds, Prednisone, Tramadol, Codeine, Trazodone, Peanut-containing drug products, Topamax [topiramate], Sulfa antibiotics, Tape, and Zetia [ezetimibe]    Review of Systems   Review of Systems  Constitutional:  Positive for activity change, appetite change and fatigue. Negative for fever.  HENT:  Negative for congestion and rhinorrhea.   Respiratory:  Positive for shortness of breath. Negative for chest tightness.   Cardiovascular:  Positive for leg swelling.  Gastrointestinal:  Negative for abdominal pain, diarrhea and vomiting.  Genitourinary:  Negative for dysuria and hematuria.  Musculoskeletal:  Negative for arthralgias and myalgias.  Skin:  Negative for rash.  Neurological:  Positive for weakness. Negative for dizziness and headaches.   all other systems are negative except as noted in the HPI and PMH.    Physical Exam Updated Vital Signs BP (!) 147/79   Pulse 62   Temp 98 F (36.7 C) (Oral)   Resp 18   SpO2 99%  Physical Exam Vitals and nursing note reviewed.  Constitutional:      General: [name] not in acute distress.    Appearance: [name] well-developed.  HENT:     Head: Normocephalic and atraumatic.     Mouth/Throat:     Mouth: Mucous membranes are dry.     Pharynx: No oropharyngeal exudate.  Eyes:     Conjunctiva/sclera: Conjunctivae normal.     Pupils: Pupils are equal, round, and reactive to light.  Neck:     Comments: No meningismus. Cardiovascular:     Rate and Rhythm: Normal rate and regular rhythm.     Heart sounds: Normal heart sounds. No murmur heard. Pulmonary:     Effort: Pulmonary effort is normal. No respiratory distress.     Breath sounds: Normal breath sounds.  Abdominal:     Palpations:  Abdomen is soft.     Tenderness: There is no abdominal tenderness. There is no guarding or rebound.  Musculoskeletal:        General: No tenderness. Normal range of motion.     Cervical back: Normal range of motion and neck supple.     Right lower leg: Edema present.     Left lower leg: Edema present.     Comments: Weak ankle flexion extension bilaterally.  Poor effort moving legs bilaterally.  +3 pitting edema to knees. Intact PT pulses bilaterally.  Compartments soft  Skin:    General: Skin is warm.  Neurological:     Mental Status: [name] alert and oriented to person, place, and time.     Cranial Nerves: No cranial nerve deficit.     Motor: No abnormal muscle tone.     Coordination: Coordination normal.  Comments:  5/5 strength throughout. CN 2-12 intact.Equal grip strength.   Psychiatric:        Behavior: Behavior normal.     ED Results / Procedures / Treatments   Labs (all labs ordered are listed, but only abnormal results are displayed) Labs Reviewed  CBC WITH DIFFERENTIAL/PLATELET - Abnormal; Notable for the following components:      Result Value   RBC 3.00 (*)    Hemoglobin 8.1 (*)    HCT 27.6 (*)    MCHC 29.3 (*)    RDW 17.4 (*)    All other components within normal limits  COMPREHENSIVE METABOLIC PANEL - Abnormal; Notable for the following components:   BUN 75 (*)    Creatinine, Ser 4.78 (*)    Calcium 8.8 (*)    Albumin 3.4 (*)    Alkaline Phosphatase 142 (*)    Total Bilirubin 0.2 (*)    GFR, Estimated 10 (*)    All other components within normal limits  BRAIN NATRIURETIC PEPTIDE - Abnormal; Notable for the following components:   B Natriuretic Peptide 256.0 (*)    All other components within normal limits  URINALYSIS, ROUTINE W REFLEX MICROSCOPIC  TROPONIN I (HIGH SENSITIVITY)  TROPONIN I (HIGH SENSITIVITY)    EKG EKG Interpretation  Date/Time:  Saturday Jun 10 2022 12:17:53 EDT Ventricular Rate:  61 PR Interval:  179 QRS Duration: 92 QT  Interval:  445 QTC Calculation: 449 R Axis:   26 Text Interpretation: Sinus rhythm Low voltage, precordial leads No significant change was found Confirmed by Glynn Octave [phone]) on 06/10/2022 12:24:01 PM  Radiology DG Chest Portable 1 View  Result Date: 06/10/2022 CLINICAL DATA:  Shortness of breath.  Fall. EXAM: PORTABLE CHEST 1 VIEW COMPARISON:  Chest radiographs 06/02/2022 and 02/28/2022 FINDINGS: Cardiac silhouette is again moderately enlarged. Mediastinal contours are within normal limits. Low lung volumes. No focal airspace opacity. Old healed posterior left rib fracture. No definite pleural effusion. No pneumothorax. No acute skeletal abnormality. Mild dextrocurvature of the mid to upper thoracic spine with moderate multilevel degenerative disc changes. IMPRESSION: 1. Low lung volumes. No focal airspace opacity. 2. Moderate cardiomegaly. Electronically Signed   By: Neita Garnet M.D.   On: 06/10/2022 12:50   DG Pelvis Portable  Result Date: 06/10/2022 CLINICAL DATA:  Fall. EXAM: PORTABLE PELVIS 1-2 VIEWS COMPARISON:  AP pelvis 07/09/2021, pelvis and bilateral hip radiographs 06/03/2022 FINDINGS: Single frontal view of the pelvis.  The Right femur is obliqued, causing bone overlap and limiting evaluation. Within this limitation, no definite acute fracture is seen. Mild pubic symphysis joint space narrowing and peripheral osteophytosis. Mild bilateral sacroiliac subchondral sclerosis. IMPRESSION: The right femur is obliqued, causing bone overlap and limiting evaluation. Within this limitation, no definite acute fracture is seen. Electronically Signed   By: Neita Garnet M.D.   On: 06/10/2022 12:49    Procedures Procedures    Medications Ordered in ED Medications - No data to display  ED Course/ Medical Decision Making/ A&P                             Medical Decision Making Amount and/or Complexity of Data Reviewed Independent Historian: EMS Labs: ordered. Decision-making details  documented in ED Course. Radiology: ordered and independent interpretation performed. Decision-making details documented in ED Course. ECG/medicine tests: ordered and independent interpretation performed. Decision-making details documented in ED Course.  Risk Prescription drug management. Decision regarding hospitalization.  Leg pain, leg swelling,  generalized weakness.  No increased work of breathing, no hypoxia.  Intact distal pulses  Patient did have Doppler studies of her legs 6 days ago which were negative for DVT.  Labs today show a creatinine of 4.78 which is rising from 4.1 5 days ago.  CXR with cardiomegaly without edema.  Labs show stable anemia.  Patient does appear to volume overloaded will be given a dose of IV Lasix.  She describes increased weakness in her legs with more difficulty with transferring and pain.  MRI will be obtained to evaluate the source of her leg weakness as well as altered mental status.  Discussed with family practice residents       Final Clinical Impression(s) / ED Diagnoses Final diagnoses:  Leg swelling  Weakness  AKI (acute kidney injury) [facility])    Rx / DC Orders ED Discharge Orders     None         Jahzaria Vary, Jeannett Senior, MD 06/10/22 1728

## 2022-06-10 NOTE — Assessment & Plan Note (Addendum)
Still appears fluid overloaded with 2+ pitting edema in BLLE with scant bibasilar crackles - s/p Lasix IV 40 in ED. Will re-dose for IV Lasix 40 x1 this morning  - Hold home Torsemide 100mg  daily - Strict I/O's - daily weights - Recent echo 5/21 showed EF 60-65%, grade 1 diastolic dysfunction - AM BMP, CBC - PT/OT

## 2022-06-10 NOTE — ED Notes (Signed)
ED TO INPATIENT HANDOFF REPORT  ED Nurse Name and Phone #: Josh  S Name/Age/Gender Tiffany [name] 62 y.o. female Room/Bed: 008C/008C  Code Status   Code Status: Full Code  Home/SNF/Other Home Patient oriented to: self, place, time, and situation Is this baseline? Yes   Triage Complete: Triage complete  Chief Complaint CHF (congestive heart failure) (HCC) [I50.9]  Triage Note Pt from home via [facility] with reports of weakness, fall on Friday, and bilateral leg pain.    Allergies Allergies  Allergen Reactions   Aspirin     GI bleed x2   Hydrocodone Shortness Of Breath   Latuda [Lurasidone Hcl] Anaphylaxis   Magnesium-Containing Compounds Anaphylaxis and Other (See Comments)    Tolerated Ensure   Prednisone Anaphylaxis, Swelling and Other (See Comments)    Tongue swelling, lip swelling, throat swelling, per pt    Tramadol Anaphylaxis, Swelling and Rash   Codeine Nausea And Vomiting and Rash   Trazodone Other (See Comments)    paranoia   Peanut-Containing Drug Products Hives    Raw peanuts, but can eat peanut butter   Topamax [Topiramate] Other (See Comments)    Increases paranoia   Sulfa Antibiotics Itching   Tape Rash   Zetia [Ezetimibe] Itching and Rash    Level of Care/Admitting Diagnosis ED Disposition     ED Disposition  Admit   Condition  --   Comment  Hospital Area: [facility] [[id number]]  Level of Care: Telemetry Medical [[id number]]  May place patient in observation at [facility] or [facility] if equivalent level of care is available:: Yes  Covid Evaluation: Asymptomatic - no recent exposure (last 10 days) testing not required  Diagnosis: CHF (congestive heart failure) [facility]) [[id number]]  Admitting Physician: Alfredo Martinez [[id number]]  Attending Physician: Esmeralda Arthur          B Medical/Surgery History Past Medical History:  Diagnosis Date   Agitation 11/22/2017   Anoxic brain injury (HCC) 09/08/2016   C. Arrest due to  respiratory failure and COPD exacerbation   Anxiety    Arthritis    "all over" (04/10/2016)   Asthma 10/18/2010   Binge eating disorder    Cardiac arrest (HCC) 09/08/2016   PEA   Carotid artery stenosis    1-39% bilateral by dopplers 11/2016   Chronic diastolic (congestive) heart failure (HCC)    Chronic kidney disease, stage 3 (HCC)    Chronic pain syndrome 06/18/2012   Chronic post-traumatic stress disorder (PTSD) 05/27/2018   Chronic respiratory failure with hypoxia and hypercapnia (HCC) 06/22/2015   TRILOGY Vent >AVAPA-ES., Vt target 200-400, Max P 30 , PS max 20 , PS min 6-10 , E Max 6, E Min 4, Rate Auto AVAPS Rate 2 (titrate for pt comfort) , bleed O2 at 5l/m continuous flow .    CKD (chronic kidney disease) 02/03/2022   Closed displaced fracture of fifth metacarpal bone 03/21/2018   Cocaine use disorder, severe, in sustained remission (HCC) 12/17/2015   Complication of anesthesia    decreased bp, decreased heart rate   COPD (chronic obstructive pulmonary disease) (HCC) 07/08/2014   Depression    Diabetic neuropathy (HCC) 04/24/2011   Difficulty with speech 01/24/2018   Disorder of nervous system    Drug abuse (HCC) 11/21/2017   Dyslipidemia 04/24/2011   Elevated troponin 04/28/2012   Emphysema    Encephalopathy 11/21/2017   Essential hypertension 03/22/2016   Fibula fracture 07/10/2016   Frequent falls 10/11/2017   GERD (gastroesophageal reflux  disease)    Gout 04/11/2017   Heart attack (HCC) 1980s   History of blood transfusion 1994   "couldn't stop bleeding from my period"   History of drug abuse in remission (HCC) 11/28/2015   Quit in 2017   Hyperlipidemia LDL goal <70    Incontinence    Manic depression (HCC)    Morbid obesity (HCC) 10/18/2010   Obstructive sleep apnea 10/18/2010   On home oxygen therapy    "6L; 24/7" (04/10/2016)   OSA on CPAP    "wear mask sometimes" (04/10/2016)   Paranoid (HCC)    "sometimes; I'm on RX for it" (04/10/2016)   Prolonged  Q-T interval on ECG    Rectal bleeding 12/31/2015   Schizoaffective disorder, bipolar type (HCC) 04/05/2018   Seasonal allergies    Seborrheic keratoses 12/31/2013   Seizures (HCC)    "don't know what kind; last one was ~ 1 yr ago" (04/10/2016)   Sinus bradycardia    Stroke [facility]) 1980s   denies residual on 04/10/2016   Thrush 09/19/2013   Type 2 diabetes mellitus (HCC) 10/18/2010   Past Surgical History:  Procedure Laterality Date   CESAREAN SECTION  1997   COLONOSCOPY WITH PROPOFOL N/A 04/01/2021   Procedure: COLONOSCOPY WITH PROPOFOL;  Surgeon: Jeani Hawking, MD;  Location: [facility] ENDOSCOPY;  Service: Gastroenterology;  Laterality: N/A;  Rectal bleeding with drop in hemoglobin to 7.2 g/dL   HERNIA REPAIR     IR CHOLANGIOGRAM EXISTING TUBE  07/20/2016   IR PERC CHOLECYSTOSTOMY  05/10/2016   IR RADIOLOGIST EVAL & MGMT  06/08/2016   IR RADIOLOGIST EVAL & MGMT  06/29/2016   IR SINUS/FIST TUBE CHK-NON GI  07/12/2016   RIGHT/LEFT HEART CATH AND CORONARY ANGIOGRAPHY N/A 06/19/2017   Procedure: RIGHT/LEFT HEART CATH AND CORONARY ANGIOGRAPHY;  Surgeon: Dolores Patty, MD;  Location: [facility] INVASIVE CV LAB;  Service: Cardiovascular;  Laterality: N/A;   TIBIA IM NAIL INSERTION Right 07/12/2016   Procedure: INTRAMEDULLARY (IM) NAIL RIGHT TIBIA;  Surgeon: Tarry Kos, MD;  Location: [facility] OR;  Service: Orthopedics;  Laterality: Right;   UMBILICAL HERNIA REPAIR  ~ 1963   "that's why I don't have a belly button"   VAGINAL HYSTERECTOMY       A IV Location/Drains/Wounds Patient Lines/Drains/Airways Status     Active Line/Drains/Airways     Name Placement date Placement time Site Days   Peripheral IV 06/10/22 20 G Right Antecubital 06/10/22  1227  Antecubital  less than 1   Pressure Injury Sacrum Mid Stage 2 -  Partial thickness loss of dermis presenting as a shallow open injury with a red, pink wound bed without slough. --  --  -- --   Wound / Incision (Open or Dehisced) 05/11/20 Heel Left open skin, pink  and no drainage 05/11/20  --  Heel  760   Wound / Incision (Open or Dehisced) 06/02/22 Non-pressure wound Buttocks Right 06/02/22  2340  Buttocks  8            Intake/Output Last 24 hours No intake or output data in the 24 hours ending 06/10/22 1554  Labs/Imaging Results for orders placed or performed during the hospital encounter of 06/10/22 (from the past 48 hour(s))  Brain natriuretic peptide     Status: Abnormal   Collection Time: 06/10/22 12:17 PM  Result Value Ref Range   B Natriuretic Peptide 256.0 (H) 0.0 - 100.0 pg/mL    Comment: Performed at [facility], [address]  [place], [place] [address]  CBC with Differential     Status: Abnormal   Collection Time: 06/10/22 12:23 PM  Result Value Ref Range   WBC 5.1 4.0 - 10.5 K/uL   RBC 3.00 (L) 3.87 - 5.11 MIL/uL   Hemoglobin 8.1 (L) 12.0 - 15.0 g/dL   HCT 60.4 (L) 54.0 - 98.1 %   MCV 92.0 80.0 - 100.0 fL   MCH 27.0 26.0 - 34.0 pg   MCHC 29.3 (L) 30.0 - 36.0 g/dL   RDW 19.1 (H) 47.8 - 29.5 %   Platelets 207 150 - 400 K/uL   nRBC 0.0 0.0 - 0.2 %   Neutrophils Relative % 64 %   Neutro Abs 3.3 1.7 - 7.7 K/uL   Lymphocytes Relative 21 %   Lymphs Abs 1.1 0.7 - 4.0 K/uL   Monocytes Relative 10 %   Monocytes Absolute 0.5 0.1 - 1.0 K/uL   Eosinophils Relative 4 %   Eosinophils Absolute 0.2 0.0 - 0.5 K/uL   Basophils Relative 1 %   Basophils Absolute 0.0 0.0 - 0.1 K/uL   Immature Granulocytes 0 %   Abs Immature Granulocytes 0.01 0.00 - 0.07 K/uL    Comment: Performed at [facility], [address], [place], [place] [address]  Comprehensive metabolic panel     Status: Abnormal   Collection Time: 06/10/22 12:23 PM  Result Value Ref Range   Sodium 138 135 - 145 mmol/L   Potassium 4.0 3.5 - 5.1 mmol/L   Chloride 103 98 - 111 mmol/L   CO2 24 22 - 32 mmol/L   Glucose, Bld 96 70 - 99 mg/dL    Comment: Glucose reference range applies only to samples taken after fasting for at least 8 hours.   BUN 75 (H) 8 - 23  mg/dL   Creatinine, Ser 8.65 (H) 0.44 - 1.00 mg/dL   Calcium 8.8 (L) 8.9 - 10.3 mg/dL   Total Protein 6.5 6.5 - 8.1 g/dL   Albumin 3.4 (L) 3.5 - 5.0 g/dL   AST 18 15 - 41 U/L   ALT 12 0 - 44 U/L   Alkaline Phosphatase 142 (H) 38 - 126 U/L   Total Bilirubin 0.2 (L) 0.3 - 1.2 mg/dL   GFR, Estimated 10 (L) >60 mL/min    Comment: (NOTE) Calculated using the CKD-EPI Creatinine Equation (2021)    Anion gap 11 5 - 15    Comment: Performed at [facility], [address], [place], [place] [address]  Troponin I (High Sensitivity)     Status: None   Collection Time: 06/10/22 12:23 PM  Result Value Ref Range   Troponin I (High Sensitivity) 12 <18 ng/L    Comment: (NOTE) Elevated high sensitivity troponin I (hsTnI) values and significant  changes across serial measurements may suggest ACS but many other  chronic and acute conditions are known to elevate hsTnI results.  Refer to the "Links" section for chest pain algorithms and additional  guidance. Performed at [facility], [address] [place], [place] [address]    *Note: Due to a large number of results and/or encounters for the requested time period, some results have not been displayed. A complete set of results can be found in Results Review.   DG Chest Portable 1 View  Result Date: 06/10/2022 CLINICAL DATA:  Shortness of breath.  Fall. EXAM: PORTABLE CHEST 1 VIEW COMPARISON:  Chest radiographs 06/02/2022 and 02/28/2022 FINDINGS: Cardiac silhouette is again moderately enlarged. Mediastinal contours  are within normal limits. Low lung volumes. No focal airspace opacity. Old healed posterior left rib fracture. No definite pleural effusion. No pneumothorax. No acute skeletal abnormality. Mild dextrocurvature of the mid to upper thoracic spine with moderate multilevel degenerative disc changes. IMPRESSION: 1. Low lung volumes. No focal airspace opacity. 2. Moderate cardiomegaly. Electronically Signed   By: Neita Garnet M.D.    On: 06/10/2022 12:50   DG Pelvis Portable  Result Date: 06/10/2022 CLINICAL DATA:  Fall. EXAM: PORTABLE PELVIS 1-2 VIEWS COMPARISON:  AP pelvis 07/09/2021, pelvis and bilateral hip radiographs 06/03/2022 FINDINGS: Single frontal view of the pelvis.  The Right femur is obliqued, causing bone overlap and limiting evaluation. Within this limitation, no definite acute fracture is seen. Mild pubic symphysis joint space narrowing and peripheral osteophytosis. Mild bilateral sacroiliac subchondral sclerosis. IMPRESSION: The right femur is obliqued, causing bone overlap and limiting evaluation. Within this limitation, no definite acute fracture is seen. Electronically Signed   By: Neita Garnet M.D.   On: 06/10/2022 12:49    Pending Labs Unresulted Labs (From admission, onward)     Start     Ordered   06/11/22 0500  Basic metabolic panel  Daily,   R      06/10/22 1522   06/11/22 0500  CBC  Daily,   R      06/10/22 1522   06/10/22 1217  Urinalysis, Routine w reflex microscopic -Urine, Clean Catch  Once,   URGENT       Question:  Specimen Source  Answer:  Urine, Clean Catch   06/10/22 1217            Vitals/Pain Today's Vitals   06/10/22 1209 06/10/22 1222  BP: (!) 147/79   Pulse: 62   Resp: 18   Temp:  98 F (36.7 C)  TempSrc:  Oral  SpO2: 99%   PainSc: 8      Isolation Precautions No active isolations  Medications Medications  furosemide (LASIX) injection 40 mg (has no administration in time range)  heparin injection 5,000 Units (has no administration in time range)  acetaminophen (TYLENOL) tablet 650 mg (has no administration in time range)  pregabalin (LYRICA) capsule 75 mg (has no administration in time range)  diclofenac Sodium (VOLTAREN) 1 % topical gel 2 g (has no administration in time range)    Mobility manual wheelchair     Focused Assessments     R Recommendations: See Admitting Provider Note  Report given to:   Additional Notes:

## 2022-06-10 NOTE — Assessment & Plan Note (Signed)
Patient has been declining imaging studies, citing anxiety with MRI. She has been offered Atarax and Ativan but declines. Will discuss with her the risks associated with declining imaging. Not a high suspicion for cauda equina, but this risk will be discussed.  - diurese as above - Tylenol prn - Voltaren gel prn - Pregabalin as above -f/u lumbar spine MRI

## 2022-06-10 NOTE — H&P (Signed)
Hospital Admission History and Physical Service Pager: [phone]  Patient name: Tiffany [name] Medical record number: [id number] Date of Birth: 03-04-1960 Age: 62 y.o. Gender: female  Primary Care Provider: Tiffany Kocher, DO Consultants: None  Code Status: FULL  Preferred Emergency Contact: Patient reports that daughter is primary contact   Contact Information     Name Relation Home Work [place] Daughter   [phone]   Annayah, Munz   [phone]   [facility] Mother [phone]          Chief Complaint: leg pain, leg swelling  Assessment and Plan: Tiffany [name] is a 62 y.o. female presenting with pain in bilateral hips, and noted to be fluid overloaded and somnolent in ED. Differential for this patient's presentation of this includes CHF exacerbation, neuropathic pain, and OA.  CHF is considered given pitting edema in BL LE, recent hospitalization for CHF exac, and recent discontinuation of torsemide. Reassuringly, lung exam and CXR w/o evidence of pulm edema and resp status at baseline. Neuropathic pain is considered given pt's hx of neuropathy for which she take Pregabalin and "burning" nature of hip pain. OA is considered given hx of OA. Infectious etiologies are less likely given lack of fever and no leukocytosis, also unlikely to have BL presentation. Serious neurologic etiologies such as spinal cord injury or nerve impingement are considered given BL LE weakness, but unlikely to cause pain restricted to BL hip without radiation down LE. Will f/u lumbar spine and brain MRI from ED.   With regard to her somnolence, differential includes medication toxicity, substance toxicity/withdrawal, and CVA. Medication toxicity is supported by pt hx of polypharmacy and recent hospitalization for AMS thought to be due to overmedication. Cymbalta and Sapharis were stopped during 5/17 admission. Of note, pt has CrCl 13.7 and still taking pregabalin 100 TID which  is way above the renal dosing and could be further concentrating in setting of AKI. From chart review, it looks like there have been attempts to wean this med to 50 TID but pt taking more than prescribed. Substance toxicity/withdrawal are considered given hx of UDS positive for cocaine in 02/2022. However, pt denies any substance use currently. CVA is considered, however less likely given lack of focality on exam.   * CHF (congestive heart failure) (HCC) - Admit to FMTS Med Tele w/ attending Dr. Lum Babe - s/p Lasix IV 40 in ED. Redose as needed.  - Hold home Torsemide 100mg  daily - Strict I/O's - daily weights - Recent echo 5/21 showed EF 60-65%, grade 1 diastolic dysfunction - AM BMP, CBC - PT/OT  Dyslipidemia Cont home atorvastatin  COPD (chronic obstructive pulmonary disease) (HCC) - Cont formulary equiv of home Trelegy and albuterol  Diabetic neuropathy (HCC) - Pregabalin 75mg  daily (renally dosed) - Tylenol prn  Schizophrenia, schizo-affective (HCC) - cont home lamictal, zyprexa, lexapro  Essential hypertension Cont home amlo and coreg  Hip pain - diurese as above - Tylenol prn - Voltaren gel prn - Pregabalin as above - f/u MRI brain and lumbar spine  AKI (acute kidney injury) (HCC) Cr elevated to 4.78 (Baseline Cr ~2.7-3). Had AKI at 5/17 admission, nephrology consulted and recommended holding torsemide until outpt nephro follow-up. Given acute fluid overload, will resume diuresis for now and ensure outpt nephro f/u.     Resume other home meds pending med rec.  FEN/GI: Heart healthy VTE Prophylaxis: SQ Hep  Disposition: med tele  History of Present Illness:  Tiffany [name] is a  62 y.o. female presenting with burning pain in BL hips that started yesterday. Has been getting worse since. Saw Dr. Robyne Peers in clinic and was restarted on torsemide. She recently was off of the torsemide (see summary of prior admission). Then this morning she fell while trying to transfer from  wheelchair and had to crawl to a phone to call for help.  EMS brought her to ED. She also reports some shortness of breath, but says it is not bad. Denies bowel or urine incontinence.   5/17-5/23 Admission: Recently discharge from [facility] (5/17-5/23 w/ [facility]) for CHF exacerbation, diuresed w/ IV lasix, then diuresis held per Nephrology given AKI and was recommended to follow-up outpt w/ Nephrology.   ED Course: Vital signs stable and satting well on room air. CXR notable for low lung volumes and moderate cardiomegaly. BNP elevated.  Hip XR wnl. ED ordered MRI brain and lumbar spine given c/f somnolence and leg weakness, still pending. Pt given Lasix IV 40. Given c/f fluid overload and need for diuresis, FM was consulted and pt admitted to FMTS.   Review Of Systems: Per HPI with the following additions: Review of Systems  Constitutional:  Negative for chills and fever.  Respiratory:  Positive for shortness of breath.   Cardiovascular:  Negative for chest pain.  Musculoskeletal:  Positive for falls and myalgias.  Neurological:  Positive for weakness. Negative for focal weakness.   Pertinent Past Medical History: HFpEF CKD  T2DM COPD H/o cardiac arrest d/t respiratory failure and COPD exacerbation HTN  Schizophrenia HLD Ho Cocaine use Remainder reviewed in history tab.   Pertinent Past Surgical History: Not relevant   Remainder reviewed in history tab.   Pertinent Social History: Tobacco use: Denies Alcohol use: Denies Other Substance use: Denies Reports that she lives by herself, her 64 year old mother lives nearby and helps to take care of her.  Pertinent Family History: Mother: cancer, depression Cousin: heart failure   Remainder reviewed in history tab.   Important Outpatient Medications: Albuterol  Atorvastatin 20 mg  Carvedilol 6.25 mg BID  Cymbalta 30 mg  Lexapro 20 mg  Trelegy  Zyprexa 5 mg  Prilosec 40 mg  Prazosin 2 mg  Lyrica 100 mg BID   Saphris 5 mg BID  Torsemide 60 mg  Remainder reviewed in medication history.   Objective: BP (!) 188/95 (BP Location: Right Arm)   Pulse 61   Temp 97.9 F (36.6 C) (Oral)   Resp 16   SpO2 100%  Exam: General: Alert, friendly woman laying in bed. Speaking in full sentences with raspy voice. NAD. Neuro: Alert but sleepy, needed to be woken up a few times during exam. Oriented to person, place [facility]), day (Saturday). HEENT: NCAT. MMM. Cardiovascular: RRR, no murmurs Respiratory: Normal work of breathing on room air.  CTAB, no wheezing or crackles. Gastrointestinal: Soft, nontender, nondistended.  BS present MSK: 1/5 strength in BL LE when trying to lift off bed. Tender to palpation in bilateral anterior hips. No other areas of tenderness on BL LE.  Ext: 2+ pitting edema in BL LE  Labs:  CBC BMET  Recent Labs  Lab 06/10/22 1223  WBC 5.1  HGB 8.1*  HCT 27.6*  PLT 207   Recent Labs  Lab 06/10/22 1223  NA 138  K 4.0  CL 103  CO2 24  BUN 75*  CREATININE 4.78*  GLUCOSE 96  CALCIUM 8.8*    BNP 256 Trop 12    EKG: NSR, regular axis,  narrow QRS   Imaging Studies Performed:  CXR 1. Low lung volumes. No focal airspace opacity. 2. Moderate cardiomegaly.  Pelvic XR The right femur is obliqued, causing bone overlap and limiting evaluation. Within this limitation, no definite acute fracture is seen.   Lincoln Brigham, MD 06/10/2022, 5:23 PM PGY-1, [facility]  FPTS Intern pager: [phone], text pages welcome Secure chat group [facility] Teaching Service

## 2022-06-10 NOTE — Assessment & Plan Note (Signed)
ORA. O2 goal 88-92% - formulary equivalent of home Trelegy and albuterol

## 2022-06-10 NOTE — Assessment & Plan Note (Addendum)
-   Pregabalin 75mg  daily (renally dosed) - Tylenol prn

## 2022-06-10 NOTE — Assessment & Plan Note (Signed)
Cont home amlo and coreg

## 2022-06-10 NOTE — ED Triage Notes (Signed)
Pt from home via [facility] with reports of weakness, fall on Friday, and bilateral leg pain.

## 2022-06-10 NOTE — ED Notes (Signed)
Pt removed from bedpan where urine and stool were present.  Unable to send UA from this sample.  Pt has been placed on purewick and given lasix.

## 2022-06-10 NOTE — Progress Notes (Signed)
Pt arrived in MRI medicated for exam. Informed by initial ED RN that pt was A/O. Upon arrival pt unable to answer a single question for MRI screening. Attempted to call daughter but she knows nothing of the pt's hx. Not enough imaging to clear via priors. Per RN send back to room.

## 2022-06-10 NOTE — Progress Notes (Signed)
FMTS Brief Progress Note  S:In to check on patient this evening. She was in her bed asleep. She was arousable to tactile stimuli. She was intermittently drowsy. Her dinner tray was in front of her and half-eaten. She would intermittently become much more awake and alert and then slowly fall back asleep.   She denied any pain. Was able to tell me that she was in the hospital, "you know why", and the year was 2024.   Discussed with RN outside of the room who also reported patient was drowsy during report. He reports that vitals were taken and gradually she became more alert.    O: BP 105/74 (BP Location: Right Arm)   Pulse (!) 55   Temp (!) 97.4 F (36.3 C) (Oral)   Resp 12   SpO2 97%   Gen: Elderly, chronically ill appearing HEENT: Dry mucous membranes, edentulous Cards: RRR CTAB: Normal effort, CTAB anterior fields  Neuro: Somnolent but intermittently arouses to tactile and verbal stimuli. Examination limited 2/2 AMS. Normal upper extremity grip strength. Speech is somewhat slurred but she is edentulous and has very dry mucous membranes. No facial droop.   A/P: Had 0.5mg  IV dose of Ativan at 1808, also received dose of lyrica at that time as well. Suspect her symptoms are largely from medication side effect but will further evaluate with imaging given that patient is a poor historian and she did have recent fall.  - Orders reviewed. Labs for AM ordered, which was adjusted as needed.  - F/u CT head - Recheck later tonight   Sabino Dick, DO 06/10/2022, 8:25 PM PGY-3, [facility] Night Resident  Please page [phone] with questions.

## 2022-06-10 NOTE — Assessment & Plan Note (Signed)
Cr elevated to 4.92  (Baseline Cr ~2.7-3).  Had AKI at 5/17 admission, nephrology consulted and recommended holding torsemide until outpt nephro follow-up.  - Continue diuresis, but monitor renal function - Will need o/p nephrology follow up

## 2022-06-10 NOTE — Progress Notes (Signed)
FMTS Brief Progress Note  S:In to check on patient. NT in taking vitals. Patient was laying in bed asleep. She was initially difficult to arouse, requiring sternal rub. Later on during visit when RN came in, she became slightly more awake. She was able to tell me her name and that she just feels tired.    O: BP (!) 149/86 (BP Location: Right Arm)   Pulse (!) 59   Temp 97.7 F (36.5 C) (Oral)   Resp 12   SpO2 94%   Elderly female, somnolent but arousable to painful stimuli, dry mucous membranes   Intermittently awakens for brief periods of time.    A/P: AMS  Likely multifactorial with polypharmacy and uremia (BUN 75). Recently admitted 5/17-5/23 and appears that she was somnolent for the first 24 hours during that admission as well. It is somewhat reassuring that she does have episodes where she becomes more awake and alert but these don't last long. Prazosin and olanzapine held tonight. Head CT done and pending.   -Add on UDS  -F/u CT head  -NPO until more alert   Sabino Dick, DO 06/10/2022, 11:29 PM PGY-3, [facility] Night Resident  Please page [phone] with questions.

## 2022-06-10 NOTE — Hospital Course (Signed)
  Hypersomnia, Chronic  Medication toxicity is supported by history of polypharmacy and recent hospitalization for AMS thought to be due to overmedication. Cymbalta and Sapharis were stopped during 5/17 admission. Pregabalin was decreased due to AKI. From chart review, it looks like there have been attempts to wean this med to 50 TID but patient was taking more than prescribed. Substance toxicity/withdrawal are considered given history of UDS positive for cocaine in 02/2022. CT Head was normal. Fluctuating somnolence and fully alert and oriented on day of discharge, seems like a pattern for Tiffany [name].   Hip pain Neuropathic pain is considered given pt's hx of neuropathy for which she take Pregabalin and "burning" nature of hip pain. Osteopenia on radiologic imaging. History of L3-4 destruction on CT AP in ED visit 04/24/21 for back pain.  The CT also showed with severe lumbar stenosis.  Dr Franky Macho (NS) saw patient and he notes her lethargy and inability to follow commands.  He thought she would not be a good candidate for surgery.  Additionally, Tiffany [name] did not want the surgery. A CT follow up four months later showed progressive destruction of L3-4 c/w chronic osteomyelitis/discitis. She refused to obtain a follow-up CT and understands the risks of refusal.  Acute on chronic diastolic heart failure  Initially volume overloaded and gave IV lasix. Patient produced 1.5 L urine and lasix was discontinued and her home torsemide was restarted.  Other chronic conditions were medically managed with home medications and formulary alternatives as necessary (schizophrenia, HTN, COPD, diabetic neuropathy)  PCP Follow-up Recommendations: Assess mentation Encourage spine imaging Blood pressure needs (on amlodipine and carvedilol) Respiratory requirements (home Trelegy and albuterol) Neuropathic pain - renally dosed Lyrica Hyperlipidemia - recheck lipid panel

## 2022-06-11 ENCOUNTER — Observation Stay (HOSPITAL_COMMUNITY): Payer: 59

## 2022-06-11 ENCOUNTER — Inpatient Hospital Stay (HOSPITAL_COMMUNITY): Payer: 59

## 2022-06-11 DIAGNOSIS — E1140 Type 2 diabetes mellitus with diabetic neuropathy, unspecified: Secondary | ICD-10-CM | POA: Diagnosis present

## 2022-06-11 DIAGNOSIS — N184 Chronic kidney disease, stage 4 (severe): Secondary | ICD-10-CM | POA: Diagnosis not present

## 2022-06-11 DIAGNOSIS — Z8674 Personal history of sudden cardiac arrest: Secondary | ICD-10-CM | POA: Diagnosis not present

## 2022-06-11 DIAGNOSIS — I509 Heart failure, unspecified: Secondary | ICD-10-CM | POA: Diagnosis present

## 2022-06-11 DIAGNOSIS — Z885 Allergy status to narcotic agent status: Secondary | ICD-10-CM | POA: Diagnosis not present

## 2022-06-11 DIAGNOSIS — R109 Unspecified abdominal pain: Secondary | ICD-10-CM | POA: Diagnosis not present

## 2022-06-11 DIAGNOSIS — W050XXA Fall from non-moving wheelchair, initial encounter: Secondary | ICD-10-CM | POA: Diagnosis present

## 2022-06-11 DIAGNOSIS — R400 Somnolence: Secondary | ICD-10-CM

## 2022-06-11 DIAGNOSIS — M7989 Other specified soft tissue disorders: Secondary | ICD-10-CM | POA: Diagnosis not present

## 2022-06-11 DIAGNOSIS — Z886 Allergy status to analgesic agent status: Secondary | ICD-10-CM | POA: Diagnosis not present

## 2022-06-11 DIAGNOSIS — D631 Anemia in chronic kidney disease: Secondary | ICD-10-CM | POA: Diagnosis present

## 2022-06-11 DIAGNOSIS — I5033 Acute on chronic diastolic (congestive) heart failure: Secondary | ICD-10-CM | POA: Diagnosis not present

## 2022-06-11 DIAGNOSIS — G629 Polyneuropathy, unspecified: Secondary | ICD-10-CM | POA: Diagnosis present

## 2022-06-11 DIAGNOSIS — E1122 Type 2 diabetes mellitus with diabetic chronic kidney disease: Secondary | ICD-10-CM | POA: Diagnosis present

## 2022-06-11 DIAGNOSIS — I130 Hypertensive heart and chronic kidney disease with heart failure and stage 1 through stage 4 chronic kidney disease, or unspecified chronic kidney disease: Secondary | ICD-10-CM | POA: Diagnosis present

## 2022-06-11 DIAGNOSIS — Z882 Allergy status to sulfonamides status: Secondary | ICD-10-CM | POA: Diagnosis not present

## 2022-06-11 DIAGNOSIS — N179 Acute kidney failure, unspecified: Secondary | ICD-10-CM | POA: Diagnosis not present

## 2022-06-11 DIAGNOSIS — F259 Schizoaffective disorder, unspecified: Secondary | ICD-10-CM | POA: Diagnosis present

## 2022-06-11 DIAGNOSIS — M48061 Spinal stenosis, lumbar region without neurogenic claudication: Secondary | ICD-10-CM | POA: Diagnosis not present

## 2022-06-11 DIAGNOSIS — M545 Low back pain, unspecified: Secondary | ICD-10-CM | POA: Diagnosis not present

## 2022-06-11 DIAGNOSIS — T50991A Poisoning by other drugs, medicaments and biological substances, accidental (unintentional), initial encounter: Secondary | ICD-10-CM | POA: Diagnosis present

## 2022-06-11 DIAGNOSIS — Z91010 Allergy to peanuts: Secondary | ICD-10-CM | POA: Diagnosis not present

## 2022-06-11 DIAGNOSIS — R4182 Altered mental status, unspecified: Secondary | ICD-10-CM | POA: Diagnosis present

## 2022-06-11 DIAGNOSIS — R29898 Other symptoms and signs involving the musculoskeletal system: Secondary | ICD-10-CM | POA: Insufficient documentation

## 2022-06-11 DIAGNOSIS — G4733 Obstructive sleep apnea (adult) (pediatric): Secondary | ICD-10-CM | POA: Diagnosis present

## 2022-06-11 DIAGNOSIS — Z888 Allergy status to other drugs, medicaments and biological substances status: Secondary | ICD-10-CM | POA: Diagnosis not present

## 2022-06-11 DIAGNOSIS — J4489 Other specified chronic obstructive pulmonary disease: Secondary | ICD-10-CM | POA: Diagnosis present

## 2022-06-11 DIAGNOSIS — G4710 Hypersomnia, unspecified: Secondary | ICD-10-CM | POA: Diagnosis not present

## 2022-06-11 DIAGNOSIS — Z993 Dependence on wheelchair: Secondary | ICD-10-CM | POA: Diagnosis not present

## 2022-06-11 DIAGNOSIS — E785 Hyperlipidemia, unspecified: Secondary | ICD-10-CM | POA: Diagnosis present

## 2022-06-11 DIAGNOSIS — Y92009 Unspecified place in unspecified non-institutional (private) residence as the place of occurrence of the external cause: Secondary | ICD-10-CM | POA: Diagnosis not present

## 2022-06-11 LAB — HEMOGLOBIN AND HEMATOCRIT, BLOOD
HCT: 27.3 % — ABNORMAL LOW (ref 36.0–46.0)
Hemoglobin: 8.5 g/dL — ABNORMAL LOW (ref 12.0–15.0)

## 2022-06-11 LAB — BASIC METABOLIC PANEL
Anion gap: 11 (ref 5–15)
BUN: 75 mg/dL — ABNORMAL HIGH (ref 8–23)
CO2: 24 mmol/L (ref 22–32)
Calcium: 8.2 mg/dL — ABNORMAL LOW (ref 8.9–10.3)
Chloride: 102 mmol/L (ref 98–111)
Creatinine, Ser: 4.92 mg/dL — ABNORMAL HIGH (ref 0.44–1.00)
GFR, Estimated: 9 mL/min — ABNORMAL LOW (ref >60)
Glucose, Bld: 89 mg/dL (ref 70–99)
Potassium: 3.5 mmol/L (ref 3.5–5.1)
Sodium: 137 mmol/L (ref 135–145)

## 2022-06-11 LAB — CBC
HCT: 25 % — ABNORMAL LOW (ref 36.0–46.0)
Hemoglobin: 7.6 g/dL — ABNORMAL LOW (ref 12.0–15.0)
MCH: 27.7 pg (ref 26.0–34.0)
MCHC: 30.4 g/dL (ref 30.0–36.0)
MCV: 91.2 fL (ref 80.0–100.0)
Platelets: 212 10*3/uL (ref 150–400)
RBC: 2.74 MIL/uL — ABNORMAL LOW (ref 3.87–5.11)
RDW: 17.2 % — ABNORMAL HIGH (ref 11.5–15.5)
WBC: 4.4 10*3/uL (ref 4.0–10.5)
nRBC: 0 % (ref 0.0–0.2)

## 2022-06-11 MED ORDER — ACETAMINOPHEN 500 MG PO TABS
500.0000 mg | ORAL_TABLET | Freq: Four times a day (QID) | ORAL | Status: DC
  Administered 2022-06-11 – 2022-06-12 (×2): 500 mg via ORAL
  Filled 2022-06-11 (×5): qty 1

## 2022-06-11 MED ORDER — CYCLOBENZAPRINE HCL 10 MG PO TABS
5.0000 mg | ORAL_TABLET | Freq: Two times a day (BID) | ORAL | Status: DC | PRN
  Administered 2022-06-11 – 2022-06-12 (×2): 5 mg via ORAL
  Filled 2022-06-11 (×2): qty 1

## 2022-06-11 MED ORDER — LORAZEPAM 0.5 MG PO TABS
0.5000 mg | ORAL_TABLET | Freq: Once | ORAL | Status: DC
  Filled 2022-06-11: qty 1

## 2022-06-11 MED ORDER — FUROSEMIDE 10 MG/ML IJ SOLN
40.0000 mg | Freq: Once | INTRAMUSCULAR | Status: AC
  Administered 2022-06-11: 40 mg via INTRAVENOUS
  Filled 2022-06-11: qty 4

## 2022-06-11 MED ORDER — HYDROXYZINE HCL 10 MG PO TABS
10.0000 mg | ORAL_TABLET | Freq: Once | ORAL | Status: DC
  Filled 2022-06-11: qty 1

## 2022-06-11 NOTE — Progress Notes (Addendum)
Daily Progress Note Intern Pager: [phone]  Patient name: Tiffany [name] Medical record number: [id number] Date of birth: 05/06/1960 Age: 62 y.o. Gender: female  Primary Care Provider: Tiffany Kocher, DO Consultants: None Code Status: FULL  Pt Overview and Major Events to Date:  5/25: Admitted. AMS overnight  Assessment and Plan: Tiffany [name] is a 62 y.o. female presenting with pain in bilateral hips, and noted to be fluid overloaded and somnolent in ED, likely due to polypharmacy and cocaine use.   Pertinent PMH/PSH includes CHF, COPD, HTN, schizoaffective disorder..  CHF (congestive heart failure) (HCC) Still appears fluid overloaded with 2+ pitting edema in BLLE with scant bibasilar crackles - s/p Lasix IV 40 in ED. Will re-dose for IV Lasix 40 x1 this morning  - Hold home Torsemide 100mg  daily - Strict I/O's - daily weights - Recent echo 5/21 showed EF 60-65%, grade 1 diastolic dysfunction - AM BMP, CBC - PT/OT  Altered mental status Resolved this AM- A&O x4. Eating breakfast without difficulty. Overnight, patient somnolent and difficult to stay awake. Likely 2/2 polypharmacy and cocaine use. - Monitor mentation  COPD (chronic obstructive pulmonary disease) (HCC) ORA. O2 goal 88-92% - Continue formulary equivalent of home Trelegy and albuterol  Diabetic neuropathy (HCC) - Pregabalin 75mg  daily (renally dosed) - Tylenol prn  Schizophrenia, schizo-affective (HCC) - cont home lamictal, zyprexa, lexapro  Essential hypertension BP stable - Continue home amlodipine and carvedilol BID  Dyslipidemia Cont home atorvastatin  Lower extremity weakness Reports weakness in BLLE. No urinary incontinence or other red flag signs/symptoms. - MRI lumbar spine  Hip pain - diurese as above - Tylenol prn - Voltaren gel prn - Pregabalin as above - f/u MRI brain and lumbar spine  AKI (acute kidney injury) (HCC) Cr elevated to 4.92  (Baseline Cr ~2.7-3).  Had AKI  at 5/17 admission, nephrology consulted and recommended holding torsemide until outpt nephro follow-up.  - Continue diuresis, but monitor renal function - Will need o/p nephrology follow up       FEN/GI: Heart healthy PPx:  Heparin 5000u q8h Dispo:Pending PT recommendations  pending clinical improvement   Subjective:  Sitting upright in bed eating eggs and french toast. Able to tell me year, location and reason for hospitalization. Asked about her home coreg and if she should take it twice daily. Says she is still having pain in lower legs that are swollen. Denies chest pain, shortness of breath, nausea or vomiting.  Objective: Temp:  [97.4 F (36.3 C)-98.2 F (36.8 C)] 97.8 F (36.6 C) (05/26 0311) Pulse Rate:  [55-68] 68 (05/26 0750) Resp:  [11-18] 16 (05/26 0750) BP: (105-188)/(71-119) 167/88 (05/26 0311) SpO2:  [94 %-100 %] 96 % (05/26 0750) Weight:  [97.5 kg] 97.5 kg (05/26 0311) Physical Exam: General: Sitting in bed, no distress Cardiovascular: RRR Respiratory: Scant wheezing and bibasilar crackles Abdomen: Soft, NTND Extremities: Chronic woody appearance of lower extremities with 2+ pitting edema to mid shin  Laboratory: Most recent CBC Lab Results  Component Value Date   WBC 4.4 06/11/2022   HGB 7.6 (L) 06/11/2022   HCT 25.0 (L) 06/11/2022   MCV 91.2 06/11/2022   PLT 212 06/11/2022   Most recent BMP    Latest Ref Rng & Units 06/11/2022    2:25 AM  BMP  Glucose 70 - 99 mg/dL 89   BUN 8 - 23 mg/dL 75   Creatinine 5.18 - 1.00 mg/dL 8.41   Sodium 660 - 630 mmol/L 137  Potassium 3.5 - 5.1 mmol/L 3.5   Chloride 98 - 111 mmol/L 102   CO2 22 - 32 mmol/L 24   Calcium 8.9 - 10.3 mg/dL 8.2     Darral Dash, DO 06/11/2022, 10:10 AM  PGY-2, [facility] FPTS Intern pager: [phone], text pages welcome Secure chat group [facility] Teaching Service

## 2022-06-11 NOTE — Assessment & Plan Note (Signed)
Somnolent again this AM, seems like a pattern for Tiffany [name]. VSS, CT head negative. Will recheck to ensure mental status back to baseline as has been the pattern during this admission with somnolence at night and normal mentation during the day. Unknown cause but possibly related to polypharmacy as previously mentioned.  - Monitor mentation

## 2022-06-11 NOTE — Progress Notes (Signed)
MD ordered po hydroxyzine to help patient relax for MRI.  Patient states she is allergic to hydroxyzine, and she thinks it made her throat swell.  She states she took it for BP.  I explained that this is not a BP med, and I reviewed her list of allergies with her as hydroxyzine is not listed as an allergy.  Patient became belligerent, refused to take the hydroxyzine, and refused to have the MRI done.  She told me to "stop talking and get out of my room."  Dr. Ardyth Harps notified of the above.  Tiffany [name]

## 2022-06-11 NOTE — Progress Notes (Signed)
Patient declined X-ray this morning. I just spoke with patient who agreed to let them return later to get abdominal x-ray so we can then get MRI lumbar spine.  Darral Dash, DO

## 2022-06-11 NOTE — Assessment & Plan Note (Signed)
Reports weakness in BLLE. No urinary incontinence or other red flag signs/symptoms. - MRI lumbar spine

## 2022-06-11 NOTE — Progress Notes (Signed)
PO ativan ordered for MRI.  Charge nurse, Herbert Seta, went in to give patient the ativan.  Patient refused the ativan, stating she's not going to MRI without being sedated."  Heather explained to the patient that she had taken ativan last night.  Patient again became argumentative, stating she didn't agree to the ativan or last night's CT scan.  [name] also still refusing the MRI.  Dr. Ardyth Harps notified of the above.  Alonza Bogus

## 2022-06-11 NOTE — Evaluation (Signed)
Physical Therapy Evaluation Patient Details Name: Tiffany [name] MRN: [id number] DOB: [birth year] Today's Date: 06/11/2022  History of Present Illness  is a 62 y.o. female presenting with pain in bilateral hips, and noted to be fluid overloaded and somnolent in ED. Pt reports burning pain in BL hips that started yesterday. Has been getting worse since. Saw Dr. Robyne Peers in clinic and was restarted on torsemide. She recently was off of the torsemide (see summary of prior admission). Then this morning she fell while trying to transfer from wheelchair and had to crawl to a phone to call for help. EMS brought her to ED. She also reports some shortness of breath, but says it is not bad MH includes HTN, CHF, COPD, DM2, CKD, PTSD, gout, seizures, CVA, anoxic brain injury, schizophrenia/schizoaffective disorder, CAD s/p MI x2, HLD, and drug abuse (cocaine).   Clinical Impression  Tiffany [name] is 62 y.o. female admitted with above HPI and diagnosis. Patient is currently limited by functional impairments below (see PT problem list). Patient lives alone and is mod I with power/manual wheelchair at baseline however has been falling frequently with multiple hospital admissions in the last 6 moths. Currently she requires min- Mod assist for bed mobility and would required +2 assist for safety with OOB mobility. Patient will benefit from continued skilled PT interventions to address impairments and progress independence with mobility. Acute PT will follow and progress as able.        Recommendations for follow up therapy are one component of a multi-disciplinary discharge planning process, led by the attending physician.  Recommendations may be updated based on patient status, additional functional criteria and insurance authorization.  Follow Up Recommendations Can patient physically be transported by private vehicle: No     Assistance Recommended at Discharge Frequent or constant Supervision/Assistance  Patient  can return home with the following  Two people to help with walking and/or transfers;A lot of help with bathing/dressing/bathroom;Assistance with cooking/housework;Direct supervision/assist for medications management;Assist for transportation;Help with stairs or ramp for entrance    Equipment Recommendations None recommended by PT  Recommendations for Other Services       Functional Status Assessment Patient has had a recent decline in their functional status and demonstrates the ability to make significant improvements in function in a reasonable and predictable amount of time.     Precautions / Restrictions Precautions Precautions: Fall Restrictions Weight Bearing Restrictions: No      Mobility  Bed Mobility Overal bed mobility: Needs Assistance Bed Mobility: Supine to Sit, Sit to Supine     Supine to sit: HOB elevated, Mod assist Sit to supine: Min assist   General bed mobility comments: Mod assist with cues to use bil UE on bed rail to pivot hips. use of bed pad to scoot hips to edge. pt able to maintain seated balance. Pt initiated bringing LE's onto bed and min assist to return to supine.    Transfers                   General transfer comment: +2 assist for safety, pt attempted lateral scooting along EOB but unable to clear hips and complete.    Ambulation/Gait                  Stairs            Wheelchair Mobility    Modified Rankin (Stroke Patients Only)       Balance Overall balance assessment: Needs assistance Sitting-balance support: Bilateral upper  extremity supported, Single extremity supported, Feet supported Sitting balance-Leahy Scale: Fair                                       Pertinent Vitals/Pain Pain Assessment Pain Assessment: No/denies pain    Home Living Family/patient expects to be discharged to:: Private residence Living Arrangements: Alone Available Help at Discharge: Personal care  attendant;Available PRN/intermittently (4 hours/day 7 days/wk) Type of Home: Apartment Home Access: Stairs to enter Entrance Stairs-Rails: None Entrance Stairs-Number of Steps: 3; patient currently has EMS take her in and out if needed; has gotten approval for getting a ramp built   Home Layout: One level Home Equipment: Rollator (4 wheels);BSC/3in1;Wheelchair - power;Wheelchair - manual Additional Comments: Pt reports that she sleeps on the couch. pt reports she is homebound and does not leave home without EMS assistance    Prior Function Prior Level of Function : Needs assist;History of Falls (last six months) (5+ falls in last 6 months - EMS needs to assist with getting her back up)             Mobility Comments: Pt reports that she mobilizes herself from couch to power wheelchair or manual WC via lateral scoot transfers. propels with bil LE's in manual WC. requires EMS to assist her in getting back up after falls. ADLs Comments: Bathes/dresses herself; states socks are difficult; wears gowns because they are easier; transfers on/off bed to change her depends     Hand Dominance   Dominant Hand: Right    Extremity/Trunk Assessment   Upper Extremity Assessment Upper Extremity Assessment: Defer to OT evaluation    Lower Extremity Assessment Lower Extremity Assessment: Generalized weakness    Cervical / Trunk Assessment Cervical / Trunk Assessment: Other exceptions Cervical / Trunk Exceptions: habitus  Communication   Communication: No difficulties  Cognition Arousal/Alertness: Awake/alert Behavior During Therapy: WFL for tasks assessed/performed Overall Cognitive Status: Within Functional Limits for tasks assessed                                 General Comments: pt alert and oriented x4 stating "we're at [facility]". pt pleasant and follows cues well, eager to mobilize with PT.        General Comments      Exercises      Assessment/Plan    PT Assessment Patient needs continued PT services  PT Problem List Decreased strength;Decreased range of motion;Decreased activity tolerance;Decreased balance;Decreased mobility;Decreased knowledge of use of DME;Decreased safety awareness;Decreased knowledge of precautions;Obesity;Decreased coordination       PT Treatment Interventions DME instruction;Gait training;Functional mobility training;Therapeutic activities;Therapeutic exercise;Balance training;Neuromuscular re-education;Cognitive remediation;Patient/family education;Wheelchair mobility training    PT Goals (Current goals can be found in the Care Plan section)  Acute Rehab PT Goals Patient Stated Goal: to get mobile again PT Goal Formulation: With patient Time For Goal Achievement: 06/25/22 Potential to Achieve Goals: Fair    Frequency Min 3X/week     Co-evaluation               AM-PAC PT "6 Clicks" Mobility  Outcome Measure Help needed turning from your back to your side while in a flat bed without using bedrails?: A Little Help needed moving from lying on your back to sitting on the side of a flat bed without using bedrails?: A Lot Help  needed moving to and from a bed to a chair (including a wheelchair)?: A Lot Help needed standing up from a chair using your arms (e.g., wheelchair or bedside chair)?: A Lot Help needed to walk in hospital room?: Total Help needed climbing 3-5 steps with a railing? : Total 6 Click Score: 11    End of Session   Activity Tolerance: Patient tolerated treatment well Patient left: with bed alarm set;with call bell/phone within reach;in bed Nurse Communication: Mobility status PT Visit Diagnosis: Other abnormalities of gait and mobility (R26.89)    Time: 4098-1191 PT Time Calculation (min) (ACUTE ONLY): 27 min   Charges:   PT Evaluation $PT Eval Moderate Complexity: 1 Mod PT Treatments $Therapeutic Activity: 8-22 mins        Wynn Maudlin, DPT Acute  Rehabilitation Services Office [phone]  06/11/22 12:21 PM

## 2022-06-11 NOTE — Evaluation (Signed)
Occupational Therapy Evaluation Patient Details Name: Tiffany [name] MRN: [id number] DOB: [birth year] Today's Date: 06/11/2022   History of Present Illness Pt is a 62 y.o. female presenting with pain in bilateral hips, and noted to be fluid overloaded and somnolent in ED. Pt reports burning pain in BL hips that started yesterday. Has been getting worse since. Saw Dr. Robyne Peers in clinic and was restarted on torsemide. She recently was off of the torsemide (see summary of prior admission). Then this morning she fell while trying to transfer from wheelchair and had to crawl to a phone to call for help. EMS brought her to ED. She also reports some shortness of breath, but says it is not bad MH includes HTN, CHF, COPD, DM2, CKD, PTSD, gout, seizures, CVA, anoxic brain injury, schizophrenia/schizoaffective disorder, CAD s/p MI x2, HLD, and drug abuse (cocaine).   Clinical Impression   Pt is w/c dependent and can typically transfer independently. She endorses many falls and has had 4 hospital admissions in 6 months. She reports being independent in self care, but struggles with LB ADLs. She has an aide who helps her with IADLs. Pt uses ambulance transportation outside of home and is awaiting a ramp. Pt presents with generalized weakness, requiring min to mod assist for bed mobility. She was not able to transfer with one person assist this visit, despite best effort needed max assist to simulate lateral scoot along EOB with inability to clear buttocks. Pt requires set up to total assist for ADLs. Patient will benefit from continued inpatient follow up therapy, <3 hours/day prior to return home.      Recommendations for follow up therapy are one component of a multi-disciplinary discharge planning process, led by the attending physician.  Recommendations may be updated based on patient status, additional functional criteria and insurance authorization.   Assistance Recommended at Discharge Frequent or constant  Supervision/Assistance  Patient can return home with the following Two people to help with walking and/or transfers;A lot of help with bathing/dressing/bathroom;Assist for transportation;Help with stairs or ramp for entrance    Functional Status Assessment  Patient has had a recent decline in their functional status and demonstrates the ability to make significant improvements in function in a reasonable and predictable amount of time.  Equipment Recommendations  None recommended by OT    Recommendations for Other Services       Precautions / Restrictions Precautions Precautions: Fall Restrictions Weight Bearing Restrictions: No      Mobility Bed Mobility Overal bed mobility: Needs Assistance Bed Mobility: Supine to Sit, Sit to Supine     Supine to sit: HOB elevated, Mod assist Sit to supine: Min assist   General bed mobility comments: cues for technique, assist for hips to EOB with bed pad, increased time and effort, min assist for LEs back into chair    Transfers                   General transfer comment: max assist with bed pad under hips to simulate lateral scoot along EOB, pt unable to clear hips      Balance Overall balance assessment: Needs assistance Sitting-balance support: Feet supported Sitting balance-Leahy Scale: Fair                                     ADL either performed or assessed with clinical judgement   ADL Overall ADL's : Needs assistance/impaired Eating/Feeding: Independent;Sitting  Grooming: Wash/dry hands;Wash/dry face;Sitting;Set up   Upper Body Bathing: Set up;Supervision/ safety;Sitting   Lower Body Bathing: Moderate assistance;Sitting/lateral leans   Upper Body Dressing : Set up;Sitting   Lower Body Dressing: Moderate assistance;Bed level;Sitting/lateral leans       Toileting- Clothing Manipulation and Hygiene: Total assistance;Bed level               Vision Baseline Vision/History: 1 Wears  glasses Patient Visual Report: No change from baseline       Perception     Praxis      Pertinent Vitals/Pain Pain Assessment Pain Assessment: No/denies pain     Hand Dominance Right   Extremity/Trunk Assessment Upper Extremity Assessment Upper Extremity Assessment: Overall WFL for tasks assessed   Lower Extremity Assessment Lower Extremity Assessment: Defer to PT evaluation   Cervical / Trunk Assessment Cervical / Trunk Assessment: Other exceptions;Normal Cervical / Trunk Exceptions: obesity   Communication Communication Communication: No difficulties   Cognition Arousal/Alertness: Awake/alert Behavior During Therapy: WFL for tasks assessed/performed Overall Cognitive Status: Within Functional Limits for tasks assessed                                       General Comments       Exercises     Shoulder Instructions      Home Living Family/patient expects to be discharged to:: Private residence Living Arrangements: Alone Available Help at Discharge: Personal care attendant;Available PRN/intermittently (4 hours, 7 days a week) Type of Home: Apartment Home Access: Stairs to enter Entrance Stairs-Number of Steps: 3; patient currently has EMS take her in and out if needed; has gotten approval for getting a ramp built Entrance Stairs-Rails: None Home Layout: One level     Bathroom Shower/Tub: Sponge bathes at baseline (uses wipes)   Bathroom Toilet: Standard Bathroom Accessibility: Yes   Home Equipment: Rollator (4 wheels);BSC/3in1;Wheelchair - power;Wheelchair - manual   Additional Comments: Pt reports that she sleeps on the couch. pt reports she is homebound and does not leave home without EMS assistance      Prior Functioning/Environment Prior Level of Function : Needs assist;History of Falls (last six months)             Mobility Comments: Pt reports that she mobilizes herself from couch to power wheelchair or manual WC via lateral  scoot transfers. propels with bil LE's in manual WC. requires EMS to assist her in getting back up after falls. ADLs Comments: Bathes/dresses herself; states socks are difficult; wears gowns because they are easier; transfers on/off bed to change her depends, relies on aide for IADLs        OT Problem List: Decreased strength;Obesity;Decreased knowledge of use of DME or AE      OT Treatment/Interventions: Self-care/ADL training;DME and/or AE instruction;Therapeutic activities;Patient/family education;Balance training    OT Goals(Current goals can be found in the care plan section) Acute Rehab OT Goals OT Goal Formulation: With patient Time For Goal Achievement: 06/25/22 Potential to Achieve Goals: Good ADL Goals Pt Will Perform Lower Body Bathing: with adaptive equipment;with set-up;bed level Pt Will Perform Lower Body Dressing: with min assist;with adaptive equipment;bed level Pt Will Transfer to Toilet: with min assist;squat pivot transfer;bedside commode Pt Will Perform Toileting - Clothing Manipulation and hygiene: with min assist;bed level;with adaptive equipment Additional ADL Goal #1: Pt will perform bed mobility modified independently in preparation for ADLs.  OT Frequency: Min 2X/week  Co-evaluation              AM-PAC OT "6 Clicks" Daily Activity     Outcome Measure Help from another person eating meals?: None Help from another person taking care of personal grooming?: A Little Help from another person toileting, which includes using toliet, bedpan, or urinal?: Total Help from another person bathing (including washing, rinsing, drying)?: A Lot Help from another person to put on and taking off regular upper body clothing?: A Little Help from another person to put on and taking off regular lower body clothing?: A Lot 6 Click Score: 15   End of Session    Activity Tolerance: Patient tolerated treatment well Patient left: in bed;with call bell/phone within  reach;with bed alarm set  OT Visit Diagnosis: Muscle weakness (generalized) (M62.81)                Time: 1610-9604 OT Time Calculation (min): 19 min Charges:  OT General Charges $OT Visit: 1 Visit OT Evaluation $OT Eval Moderate Complexity: 1 Mod Berna Spare, OTR/L Acute Rehabilitation Services Office: [phone]   Evern Bio 06/11/2022, 1:02 PM

## 2022-06-12 ENCOUNTER — Encounter (HOSPITAL_COMMUNITY): Payer: Self-pay | Admitting: Family Medicine

## 2022-06-12 ENCOUNTER — Encounter: Payer: Self-pay | Admitting: Family Medicine

## 2022-06-12 ENCOUNTER — Inpatient Hospital Stay (HOSPITAL_COMMUNITY): Payer: 59

## 2022-06-12 DIAGNOSIS — N8189 Other female genital prolapse: Secondary | ICD-10-CM | POA: Insufficient documentation

## 2022-06-12 DIAGNOSIS — N184 Chronic kidney disease, stage 4 (severe): Secondary | ICD-10-CM

## 2022-06-12 DIAGNOSIS — M48061 Spinal stenosis, lumbar region without neurogenic claudication: Secondary | ICD-10-CM

## 2022-06-12 DIAGNOSIS — M810 Age-related osteoporosis without current pathological fracture: Secondary | ICD-10-CM | POA: Insufficient documentation

## 2022-06-12 DIAGNOSIS — M4316 Spondylolisthesis, lumbar region: Secondary | ICD-10-CM

## 2022-06-12 DIAGNOSIS — M47816 Spondylosis without myelopathy or radiculopathy, lumbar region: Secondary | ICD-10-CM | POA: Insufficient documentation

## 2022-06-12 DIAGNOSIS — G4710 Hypersomnia, unspecified: Secondary | ICD-10-CM

## 2022-06-12 DIAGNOSIS — N179 Acute kidney failure, unspecified: Secondary | ICD-10-CM

## 2022-06-12 HISTORY — DX: Spondylosis without myelopathy or radiculopathy, lumbar region: M47.816

## 2022-06-12 HISTORY — DX: Spondylolisthesis, lumbar region: M43.16

## 2022-06-12 LAB — BASIC METABOLIC PANEL
Anion gap: 10 (ref 5–15)
BUN: 81 mg/dL — ABNORMAL HIGH (ref 8–23)
CO2: 23 mmol/L (ref 22–32)
Calcium: 8.2 mg/dL — ABNORMAL LOW (ref 8.9–10.3)
Chloride: 104 mmol/L (ref 98–111)
Creatinine, Ser: 4.74 mg/dL — ABNORMAL HIGH (ref 0.44–1.00)
GFR, Estimated: 10 mL/min — ABNORMAL LOW (ref >60)
Glucose, Bld: 101 mg/dL — ABNORMAL HIGH (ref 70–99)
Potassium: 3.7 mmol/L (ref 3.5–5.1)
Sodium: 137 mmol/L (ref 135–145)

## 2022-06-12 LAB — CBC
HCT: 25.2 % — ABNORMAL LOW (ref 36.0–46.0)
Hemoglobin: 7.7 g/dL — ABNORMAL LOW (ref 12.0–15.0)
MCH: 27.2 pg (ref 26.0–34.0)
MCHC: 30.6 g/dL (ref 30.0–36.0)
MCV: 89 fL (ref 80.0–100.0)
Platelets: 222 10*3/uL (ref 150–400)
RBC: 2.83 MIL/uL — ABNORMAL LOW (ref 3.87–5.11)
RDW: 17.3 % — ABNORMAL HIGH (ref 11.5–15.5)
WBC: 4.5 10*3/uL (ref 4.0–10.5)
nRBC: 0 % (ref 0.0–0.2)

## 2022-06-12 MED ORDER — TORSEMIDE 20 MG PO TABS
20.0000 mg | ORAL_TABLET | Freq: Two times a day (BID) | ORAL | Status: DC
  Administered 2022-06-13: 20 mg via ORAL
  Filled 2022-06-12: qty 1

## 2022-06-12 MED ORDER — TORSEMIDE 20 MG PO TABS: 40.0000 mg | ORAL_TABLET | Freq: Every day | ORAL | Status: DC

## 2022-06-12 NOTE — Progress Notes (Signed)
Spoke to patient regarding the need for a lumbar CT given osteomyelitis/discitis history in back and constellation of LE symptoms. She refused stating that she " does not trust the nurses anymore and I have been given medications that I did not know I was given." Discussed all risks associated with prolonging and declining imaging including infection, paralysis, death if she is suffering from destruction of bone 2/2 infection without ability to view. Previously not a surgical candidate. Will re-approach in AM.   Alfredo Martinez, MD

## 2022-06-12 NOTE — Progress Notes (Signed)
Received a complaint that pt's necklace was ripped during a bed change and that it is missing now. RN and NT checked the old linen, floor, and pt's bags. Myself and her RN checked her current linen. Day RN and NT do not recall her wearing a necklace at shift change. Night NT does not recall seeing a necklace either.   Documented on admission, pt's glasses were bedside and valuables requested to go to security. Security checked and did not find any belongs marked under her name. Mother states she was wearing a necklace when she came in, last time she saw the pt was 5/25.  Security to come take a report. Leadership to follow up in the AM.

## 2022-06-12 NOTE — Progress Notes (Signed)
Initial Nutrition Assessment  DOCUMENTATION CODES:   Obesity unspecified  INTERVENTION:  Ensure Max po daily, each supplement provides 150 kcal and 30 grams of protein.   Education on adequate nutrition   Continue MVI  NUTRITION DIAGNOSIS:   Predicted suboptimal nutrient intake related to chronic illness as evidenced by mild fat depletion, moderate muscle depletion, mild muscle depletion.  GOAL:   Patient will meet greater than or equal to 90% of their needs   MONITOR:   PO intake, I & O's, Supplement acceptance, Labs, Weight trends, Skin  REASON FOR ASSESSMENT:   Malnutrition Screening Tool    ASSESSMENT:   62 y.o. female with PMHx including but not limited to CHF, dyslipidemia, COPD, T2DM, diabetic neuropathy, schizophrenia, cocaine abuse, HTN, CKD, binge eating disorder, manic depression,GERD, anoxic brain injury, arthritis, OSA presents with CHF exacerbation, neuropathic pain and osteoarthritis  Familiar to RD services   Visited patient at bedside who reports appetite as "fine." Patient reports she eats 3 meals per day (B- eggs, [place] bacon and oatmeal; L and D vary). Patient reports she does not use salt. She reports she wears dentures but does not have them with her. [name] able to order soft texture food that she can tolerate. Patient reports that oatmeal has really helped her bowels since she used to have chronic diarrhea.   She denies issues swallowing, N/V/D/C.   Patient became tearful during interview and reports she wants to be able to walk again. She does not feel strong enough to even transfer herself anymore which is how she fell. She feels like she has moved backwards with her progress. RD provided emotional support and encouragement.   She reports the food is good here and she feels like she is eating enough. RD observed muscle wasting in upper body and was not able to properly assess LE due to edema. RD informed patient of potential masked weight loss  given fluid status. Patient is still diuresing. She reports she still cannot bend her legs well due to fluid accumulation. [name] agreeable to trying an Ensure Max for protein supplementation.   Labs: Glu 101, BUN 81, Cr 4.74, alk phos 142 Meds: vitamin B12, ferrous sulfate, folvite, MVI, protonix, demadex Wt: Unable to determine dry wt, patient has lost 15# since (7.5 kg) in 3 days  Patient has gone from 52 kg to ~90 kg in 2.5 years which is an 83# weight gain  06/12/22 90.3 kg  06/09/22 97.8 kg  06/02/22 95 kg  04/03/22 88.6 kg  03/07/22 93.4 kg  03/06/22 83.6 kg  02/27/22 99.2 kg  02/05/22 90.8 kg  01/23/22 85.9 kg  Po: no meals documented at this time  I/O's: -5.2 L     NUTRITION - FOCUSED PHYSICAL EXAM:  Flowsheet Row Most Recent Value  Orbital Region Mild depletion  Upper Arm Region Mild depletion  Thoracic and Lumbar Region No depletion  Buccal Region No depletion  Temple Region Moderate depletion  Clavicle Bone Region Moderate depletion  Clavicle and Acromion Bone Region Mild depletion  Scapular Bone Region Unable to assess  Dorsal Hand Mild depletion  Patellar Region Unable to assess  [edema]  Anterior Thigh Region Unable to assess  [edema]  Posterior Calf Region Unable to assess  [edema]  Hair Reviewed  Eyes Reviewed  Mouth Reviewed  Skin Reviewed  Nails Reviewed       Diet Order:   Diet Order             Diet Heart  Room service appropriate? Yes; Fluid consistency: Thin  Diet effective now                   EDUCATION NEEDS:      Skin:  Skin Assessment: Reviewed RN Assessment  Last BM:  5/25 type 6  Height:   Ht Readings from Last 1 Encounters:  06/11/22 5\' 4"  (1.626 m)    Weight:   Wt Readings from Last 1 Encounters:  06/12/22 90.3 kg    Ideal Body Weight:     BMI:  Body mass index is 34.17 kg/m.  Estimated Nutritional Needs:   Kcal:  1800-1980 kcal  Protein:  108-135 g  Fluid:  >1.8 L    Leodis Rains, RDN, LDN   Clinical Nutrition

## 2022-06-12 NOTE — TOC Initial Note (Signed)
Transition of Care [facility]) - Initial/Assessment Note    Patient Details  Name: Tiffany [name] MRN: [id number] Date of Birth: Apr 08, 1960  Transition of Care [facility]) CM/SW Contact:    Delilah Shan, LCSWA Phone Number: 06/12/2022, 11:58 AM  Clinical Narrative:                  [name] received consult for possible SNF placement at time of discharge. [name] spoke with patient at bedside regarding PT recommendation of SNF placement at time of discharge. Patient expressed understanding of PT recommendation and politely declined to SNF placement at time of discharge. Patient reports she spoke with her mom and that her mother will be able to provide support for her when she returns home. Patient is interested in returning home with home health services. [name] informed CM. No further questions reported at this time. [name] to continue to follow and assist with discharge planning needs.   Expected Discharge Plan: Home w Home Health Services Barriers to Discharge: Continued Medical Work up   Patient Goals and CMS Choice Patient states their goals for this hospitalization and ongoing recovery are:: to return home          Expected Discharge Plan and Services In-house Referral: Clinical Social Work     Living arrangements for the past 2 months: Apartment                                      Prior Living Arrangements/Services Living arrangements for the past 2 months: Apartment Lives with:: Self Patient language and need for interpreter reviewed:: Yes Do you feel safe going back to the place where you live?: Yes      Need for Family Participation in Patient Care: Yes (Comment) Care giver support system in place?: Yes (comment)   Criminal Activity/Legal Involvement Pertinent to Current Situation/Hospitalization: No - Comment as needed  Activities of Daily Living Home Assistive Devices/Equipment: Wheelchair ADL Screening (condition at time of admission) Patient's cognitive ability adequate  to safely complete daily activities?: Yes Is the patient deaf or have difficulty hearing?: No Does the patient have difficulty seeing, even when wearing glasses/contacts?: No Does the patient have difficulty concentrating, remembering, or making decisions?: No Patient able to express need for assistance with ADLs?: Yes Does the patient have difficulty dressing or bathing?: No Independently performs ADLs?: Yes (appropriate for developmental age) Does the patient have difficulty walking or climbing stairs?: Yes Weakness of Legs: Both Weakness of Arms/Hands: Both  Permission Sought/Granted Permission sought to share information with : Case Manager, Family Supports, Oceanographer granted to share information with : Yes, Verbal Permission Granted  Share Information with NAME: Ricarda Frame  Permission granted to share info w AGENCY: SNF  Permission granted to share info w Relationship: daughter  Permission granted to share info w Contact Information: Ricarda Frame [phone]  Emotional Assessment Appearance:: Appears stated age Attitude/Demeanor/Rapport: Gracious Affect (typically observed): Calm Orientation: : Oriented to Self, Oriented to Place, Oriented to  Time Alcohol / Substance Use: Not Applicable Psych Involvement: No (comment)  Admission diagnosis:  CHF (congestive heart failure) (HCC) [I50.9] Weakness [R53.1] Leg swelling [M79.89] AKI (acute kidney injury) (HCC) [N17.9] Altered mental status [R41.82] Patient Active Problem List   Diagnosis Date Noted   Spondylolisthesis at L3-L4 level 06/12/2022   Facet arthropathy, lumbar 06/12/2022   Osteoporosis 06/12/2022   Pelvic floor weakness in female 06/12/2022   Leg swelling 06/11/2022  Weakness of both lower limbs 06/11/2022   Lower extremity weakness 06/11/2022   CHF (congestive heart failure) (HCC) 06/10/2022   Hip pain 06/10/2022   Acute on chronic renal failure (HCC) 06/02/2022   Urinary incontinence  03/08/2022   Incontinence of feces 03/08/2022   Neck pain 02/28/2022   Dyspnea 02/03/2022   Subacute osteomyelitis of lumbar spine (HCC) 09/27/2021   Bipolar affective disorder, currently depressed, mild (HCC) 09/23/2021   Dehydration 07/09/2021   History of falling 07/01/2021   Anemia of renal disease 06/16/2021   Chronic kidney disease, stage 3b (HCC) 06/06/2021   Anxiety and depression 05/25/2021   Physical deconditioning 05/25/2021   Cauda equina compression (HCC) 04/24/2021   Hyperlipidemia 03/31/2021   Coronary artery disease involving native coronary artery of native heart without angina pectoris 01/16/2021   Anxiety disorder, unspecified 01/16/2021   Personal history of nicotine dependence 01/16/2021   GERD (gastroesophageal reflux disease)    Polypharmacy 11/17/2020   Schizophrenia (HCC) 02/20/2020   Schizophrenia, schizo-affective (HCC) 02/20/2020   Hypersomnia, Chronic    Adnexal cyst, right 01/02/2020   Genital herpes 11/25/2019   Family discord 02/04/2019   PTSD (post-traumatic stress disorder) 05/27/2018   Schizoaffective disorder, bipolar type (HCC) 04/05/2018   Frequent falls 10/11/2017   Dependence on continuous supplemental oxygen 05/14/2017   Gout 04/11/2017   Chronic diastolic congestive heart failure (HCC) 04/02/2017   Chronic kidney disease (CKD), stage IV (severe) (HCC) 12/15/2016   History of Anoxic brain injury (HCC) 09/08/2016   Overactive bladder 06/07/2016   OSA and COPD overlap syndrome (HCC)    Weakness    Osteoarthritis, multiple sites    Essential hypertension 03/22/2016   Acute renal failure superimposed on stage 4 chronic kidney disease (HCC) 11/24/2015   Tobacco use disorder 07/22/2014   COPD (chronic obstructive pulmonary disease) (HCC) 07/08/2014   Seizure (HCC) 01/04/2013   Chronic low back pain with sciatica 06/18/2012   Dyslipidemia 04/24/2011   Chronic painful diabetic neuropathy (HCC) 04/24/2011   Morbid obesity (HCC) 10/18/2010    PCP:  Tiffany Kocher, DO Pharmacy:   [facility] [phone] Ginette Otto, [place] - [address] AT [address] [place] [address] Phone: [phone] Fax: [phone]     Social Determinants of Health (SDOH) Social History: SDOH Screenings   Food Insecurity: No Food Insecurity (06/10/2022)  Recent Concern: Food Insecurity - Food Insecurity Present (06/09/2022)  Housing: Low Risk  (06/10/2022)  Transportation Needs: No Transportation Needs (06/10/2022)  Utilities: Not At Risk (06/10/2022)  Alcohol Screen: Low Risk  (07/24/2021)  Depression (PHQ2-9): Low Risk  (06/09/2022)  Recent Concern: Depression (PHQ2-9) - Medium Risk (04/03/2022)  Financial Resource Strain: Medium Risk (07/24/2021)  Physical Activity: Inactive (07/24/2021)  Social Connections: Moderately Isolated (07/24/2021)  Stress: Stress Concern Present (07/24/2021)  Tobacco Use: Medium Risk (06/12/2022)   SDOH Interventions:     Readmission Risk Interventions    06/05/2022    2:47 PM 02/07/2022    2:22 PM 05/24/2021    6:19 PM  Readmission Risk Prevention Plan  Transportation Screening Complete Complete Complete  Medication Review (RN Care Manager) Referral to Pharmacy Complete Referral to Pharmacy  PCP or Specialist appointment within 3-5 days of discharge Complete Complete Complete  HRI or Home Care Consult Complete Complete Complete  SW Recovery Care/Counseling Consult Complete Complete Complete  Palliative Care Screening Not Applicable Not Applicable   Skilled Nursing Facility Not Applicable Not Applicable Complete

## 2022-06-12 NOTE — TOC Progression Note (Addendum)
Transition of Care [facility]) - Progression Note    Patient Details  Name: Tiffany [name] MRN: [id number] Date of Birth: 08/05/1960  Transition of Care [facility]) CM/SW Contact  Harriet Masson, RN Phone Number: 06/12/2022, 3:15 PM  Clinical Narrative:     Spoke to patient regarding transition needs.  Patient states she lives with her mother and has PCS aides 3hrs a day. Has all needed DME. Has used [facility] and would like them them again. Sent secure message and email sent to [place] with [facility], awaiting return call. TOC following.   1600 patient is active with [facility] for PT, OT and will need resumption orders. Wheelchair ordered.Patient has no preference of DME agenc.   Jermaine with [employer] notified of DME order.  Expected Discharge Plan: Home w Home Health Services Barriers to Discharge: Continued Medical Work up  Expected Discharge Plan and Services In-house Referral: Clinical Social Work     Living arrangements for the past 2 months: Apartment                                       Social Determinants of Health (SDOH) Interventions SDOH Screenings   Food Insecurity: No Food Insecurity (06/10/2022)  Recent Concern: Food Insecurity - Food Insecurity Present (06/09/2022)  Housing: Low Risk  (06/10/2022)  Transportation Needs: No Transportation Needs (06/10/2022)  Utilities: Not At Risk (06/10/2022)  Alcohol Screen: Low Risk  (07/24/2021)  Depression (PHQ2-9): Low Risk  (06/09/2022)  Recent Concern: Depression (PHQ2-9) - Medium Risk (04/03/2022)  Financial Resource Strain: Medium Risk (07/24/2021)  Physical Activity: Inactive (07/24/2021)  Social Connections: Moderately Isolated (07/24/2021)  Stress: Stress Concern Present (07/24/2021)  Tobacco Use: Medium Risk (06/12/2022)    Readmission Risk Interventions    06/05/2022    2:47 PM 02/07/2022    2:22 PM 05/24/2021    6:19 PM  Readmission Risk Prevention Plan  Transportation Screening Complete Complete Complete   Medication Review (RN Care Manager) Referral to Pharmacy Complete Referral to Pharmacy  PCP or Specialist appointment within 3-5 days of discharge Complete Complete Complete  HRI or Home Care Consult Complete Complete Complete  SW Recovery Care/Counseling Consult Complete Complete Complete  Palliative Care Screening Not Applicable Not Applicable   Skilled Nursing Facility Not Applicable Not Applicable Complete

## 2022-06-12 NOTE — Progress Notes (Signed)
Went to discuss getting MRI with pt. She was asleep with stable vitals and breathing comfortably on RA. She opened her eyes briefly to voice but quickly went back to sleep. She would continue to open her eyes to my voice and touching her shoulder but would not wake up long enough to have a conversation. She has a history of intermittent somnolence and was somnolent in ED on 5/25. Head CT negative. Somnolence likely d/t pt being very sleepy as it is the middle of the night and in setting of polypharmacy. Will attempt MRI tomorrow.

## 2022-06-12 NOTE — Progress Notes (Signed)
Daily Progress Note Intern Pager: [phone]  Patient name: Tiffany [name] Medical record number: [id number] Date of birth: 09-Apr-1960 Age: 62 y.o. Gender: female  Primary Care Provider: Tiffany Kocher, DO Consultants: None Code Status: FULL   Pt Overview and Major Events to Date:  5/25: Admitted.  5/25, 5/26:Somnolence ON   Assessment and Plan: Tiffany [name] is a 62 y.o. female presenting with pain in bilateral hips, and noted to be fluid overloaded and somnolent in ED, likely due to polypharmacy and cocaine use. Improved somnolence throughout the day. CT scan head negative.    Pertinent PMH/PSH includes CHF, COPD, HTN, schizoaffective disorder.   * Altered mental status Somnolent again this AM, seems like a pattern for Tiffany [name]. VSS, CT head negative. Will recheck to ensure mental status back to baseline as has been the pattern during this admission with somnolence at night and normal mentation during the day. Unknown cause but possibly related to polypharmacy as previously mentioned.  - Monitor mentation  CHF (congestive heart failure) (HCC) Closer to euvolemic state today. -650 out overnight, -5 total admission. Seems closer to dry weight  -will discuss diuretics, currently dosing lasix IV but may benefit from re-starting home torsemide - Strict I/O's - daily weights - AM BMP, CBC - PT/OT  COPD (chronic obstructive pulmonary disease) (HCC) ORA. O2 goal 88-92% - formulary equivalent of home Trelegy and albuterol  Diabetic neuropathy (HCC) - Pregabalin 75mg  daily (renally dosed) - Tylenol prn  Schizophrenia, schizo-affective (HCC) - cont home lamictal, zyprexa, lexapro  Essential hypertension BP stable - Continue home amlodipine and carvedilol BID  Dyslipidemia Cont home atorvastatin  Lower extremity weakness Reports weakness in BLLE. No urinary incontinence or other red flag signs/symptoms. See hip pain.  - MRI lumbar spine  Hip pain Patient has  been declining imaging studies, citing anxiety with MRI. She has been offered Atarax and Ativan but declines. Will discuss with her the risks associated with declining imaging. Not a high suspicion for cauda equina, but this risk will be discussed.  - diurese as above - Tylenol prn - Voltaren gel prn - Pregabalin as above -f/u lumbar spine MRI  AKI (acute kidney injury) (HCC) Cr elevated to 4.74  (Baseline Cr ~2.7-3). Does not appear that she was able to come back to baseline after most recent admission 7 days ago.  - Continue diuresis, monitor renal function - Will need o/p nephrology follow up     FEN/GI: Heart Healthy  PPx: heparin Dispo:SNF pending clinical improvement .   Subjective:  Difficult to arouse but opening eyes to sternal rub and verbal stimulation   Objective: Temp:  [97.6 F (36.4 C)-98.8 F (37.1 C)] 98.5 F (36.9 C) (05/27 0719) Pulse Rate:  [55-75] 57 (05/27 0719) Resp:  [15-20] 15 (05/27 0719) BP: (129-174)/(67-89) 143/86 (05/27 0719) SpO2:  [94 %-100 %] 95 % (05/27 0811) Weight:  [90.3 kg] 90.3 kg (05/27 0500) General: Sleeping in bed Heart: Regular rate and rhythm with no murmurs appreciated Lungs: CTA bilaterally, no wheezing Abdomen: non-distended Skin: Warm and dry Extremities: minimal pitting edema   Laboratory: Most recent CBC Lab Results  Component Value Date   WBC 4.5 06/12/2022   HGB 7.7 (L) 06/12/2022   HCT 25.2 (L) 06/12/2022   MCV 89.0 06/12/2022   PLT 222 06/12/2022   Most recent BMP    Latest Ref Rng & Units 06/12/2022    2:19 AM  BMP  Glucose 70 - 99 mg/dL 147  BUN 8 - 23 mg/dL 81   Creatinine 8.29 - 1.00 mg/dL 5.62   Sodium 130 - 865 mmol/L 137   Potassium 3.5 - 5.1 mmol/L 3.7   Chloride 98 - 111 mmol/L 104   CO2 22 - 32 mmol/L 23   Calcium 8.9 - 10.3 mg/dL 8.2     Alfredo Martinez, MD 06/12/2022, 9:14 AM  PGY-2, [place] [facility] FPTS Intern pager: [phone], text pages welcome Secure chat group [facility]  [facility] Teaching Service

## 2022-06-12 NOTE — Plan of Care (Signed)
  Problem: Cardiac: Goal: Ability to achieve and maintain adequate cardiopulmonary perfusion will improve Outcome: Progressing   

## 2022-06-12 NOTE — Assessment & Plan Note (Signed)
Osteopenia on radiologic imaging

## 2022-06-12 NOTE — Progress Notes (Signed)
   Durable Medical Equipment (From admission, onward)        Start     Ordered  06/12/22 1553  For home use only DME standard manual wheelchair with seat cushion  Once      Comments: Patient suffers from lower extremity deconditioning which impairs their ability to perform daily activities like bathing, grooming, and toileting in the home.  A cane, crutch, or walker will not resolve issue with performing activities of daily living. A wheelchair will allow patient to safely perform daily activities. Patient can safely propel the wheelchair in the home or has a caregiver who can provide assistance. Length of need Lifetime. Accessories: elevating leg rests (ELRs), wheel locks, extensions and anti-tippers.  06/12/22 1553

## 2022-06-13 ENCOUNTER — Ambulatory Visit: Payer: Self-pay

## 2022-06-13 ENCOUNTER — Telehealth (HOSPITAL_COMMUNITY): Payer: Self-pay | Admitting: Licensed Clinical Social Worker

## 2022-06-13 ENCOUNTER — Other Ambulatory Visit (HOSPITAL_COMMUNITY): Payer: Self-pay

## 2022-06-13 LAB — MULTIPLE MYELOMA PANEL, SERUM
Albumin SerPl Elph-Mcnc: 3.2 g/dL (ref 2.9–4.4)
Albumin/Glob SerPl: 1.2 (ref 0.7–1.7)
Alpha 1: 0.3 g/dL (ref 0.0–0.4)
Alpha2 Glob SerPl Elph-Mcnc: 0.5 g/dL (ref 0.4–1.0)
B-Globulin SerPl Elph-Mcnc: 0.9 g/dL (ref 0.7–1.3)
Gamma Glob SerPl Elph-Mcnc: 1.1 g/dL (ref 0.4–1.8)
Globulin, Total: 2.7 g/dL (ref 2.2–3.9)
IgA: 273 mg/dL (ref 87–352)
IgG (Immunoglobin G), Serum: 1202 mg/dL (ref 586–1602)
IgM (Immunoglobulin M), Srm: 83 mg/dL (ref 26–217)
Total Protein ELP: 5.9 g/dL — ABNORMAL LOW (ref 6.0–8.5)

## 2022-06-13 LAB — CBC
HCT: 28.4 % — ABNORMAL LOW (ref 36.0–46.0)
Hemoglobin: 8.6 g/dL — ABNORMAL LOW (ref 12.0–15.0)
MCH: 27.9 pg (ref 26.0–34.0)
MCHC: 30.3 g/dL (ref 30.0–36.0)
MCV: 92.2 fL (ref 80.0–100.0)
Platelets: 229 10*3/uL (ref 150–400)
RBC: 3.08 MIL/uL — ABNORMAL LOW (ref 3.87–5.11)
RDW: 17.6 % — ABNORMAL HIGH (ref 11.5–15.5)
WBC: 4.5 10*3/uL (ref 4.0–10.5)
nRBC: 0 % (ref 0.0–0.2)

## 2022-06-13 LAB — BASIC METABOLIC PANEL
Anion gap: 12 (ref 5–15)
BUN: 81 mg/dL — ABNORMAL HIGH (ref 8–23)
CO2: 23 mmol/L (ref 22–32)
Calcium: 8.3 mg/dL — ABNORMAL LOW (ref 8.9–10.3)
Chloride: 102 mmol/L (ref 98–111)
Creatinine, Ser: 4.58 mg/dL — ABNORMAL HIGH (ref 0.44–1.00)
GFR, Estimated: 10 mL/min — ABNORMAL LOW (ref >60)
Glucose, Bld: 82 mg/dL (ref 70–99)
Potassium: 3.8 mmol/L (ref 3.5–5.1)
Sodium: 137 mmol/L (ref 135–145)

## 2022-06-13 MED ORDER — TORSEMIDE 20 MG PO TABS
20.0000 mg | ORAL_TABLET | Freq: Two times a day (BID) | ORAL | 0 refills | Status: DC
  Filled 2022-06-13: qty 60, 30d supply, fill #0

## 2022-06-13 MED ORDER — PRAZOSIN HCL 1 MG PO CAPS
2.0000 mg | ORAL_CAPSULE | Freq: Every day | ORAL | 0 refills | Status: DC
  Filled 2022-06-13: qty 60, 30d supply, fill #0

## 2022-06-13 MED ORDER — ESCITALOPRAM OXALATE 10 MG PO TABS
10.0000 mg | ORAL_TABLET | Freq: Every day | ORAL | 0 refills | Status: DC
  Filled 2022-06-13: qty 30, 30d supply, fill #0

## 2022-06-13 MED ORDER — OXYCODONE-ACETAMINOPHEN 5-325 MG PO TABS
1.0000 | ORAL_TABLET | ORAL | Status: AC
  Administered 2022-06-13: 1 via ORAL
  Filled 2022-06-13: qty 1

## 2022-06-13 MED ORDER — PREGABALIN 75 MG PO CAPS
75.0000 mg | ORAL_CAPSULE | Freq: Every day | ORAL | 0 refills | Status: DC
  Filled 2022-06-13: qty 30, 30d supply, fill #0

## 2022-06-13 MED ORDER — FERROUS SULFATE 325 (65 FE) MG PO TABS
325.0000 mg | ORAL_TABLET | ORAL | 0 refills | Status: DC
  Filled 2022-06-13: qty 45, 90d supply, fill #0

## 2022-06-13 MED ORDER — OXYCODONE HCL 5 MG PO TABS
2.5000 mg | ORAL_TABLET | Freq: Four times a day (QID) | ORAL | 0 refills | Status: AC | PRN
  Filled 2022-06-13: qty 10, 5d supply, fill #0

## 2022-06-13 NOTE — Progress Notes (Signed)
Daily Progress Note Intern Pager: [phone]  Patient name: Tiffany [name] Medical record number: [id number] Date of birth: 10/04/1960 Age: 62 y.o. Gender: female  Primary Care Provider: Tiffany Kocher, DO Consultants: None Code Status: FULL   Pt Overview and Major Events to Date:  5/25: Admitted.  5/25, 5/26:Somnolence ON   Assessment and Plan: Tiffany [name] is a 62 y.o. female presenting with pain in bilateral hips, and noted to be fluid overloaded and somnolent in ED, likely due to polypharmacy and cocaine use. Improved somnolence throughout the day. CT scan head negative.    Pertinent PMH/PSH includes CHF, COPD, HTN, schizoaffective disorder.  * Hip pain Patient has been declining imaging studies, citing anxiety with MRI. She has been offered Atarax and Ativan but declines. Will discuss with her the risks associated with declining imaging. Not a high suspicion for cauda equina, but this risk will be discussed.  Patient is refusing MRI at this time and understands the risks. - Tylenol prn - Voltaren gel prn - Continue pregabalin - F/u lumbar spine MRI  Osteoporosis-resolved as of 06/13/2022 Osteopenia on radiologic imaging  Lower extremity weakness Reports weakness in BLLE. No urinary incontinence or other red flag signs/symptoms. See hip pain.  - MRI lumbar spine  Acute on chronic diastolic heart failure (HCC) Closer to euvolemic state today.Total urine output 1.5 L. Seems closer to dry weight. Restarted home torsemide. - Strict I/O's - daily weights - AM BMP, CBC - PT/OT  Hypersomnia, Chronic Fluctuating somnolence and no alert and oriented this morning, seems like a pattern for Ms. Hogen. VSS, CT head negative. Unknown cause but possibly related to polypharmacy as previously mentioned.  - Monitor mentation  Acute renal failure superimposed on stage 4 chronic kidney disease (HCC) Cr elevated to 4.74  (Baseline Cr ~2.7-3). Does not appear that she was able  to come back to baseline after most recent admission 7 days ago.  - Will need o/p nephrology follow up  Schizophrenia, schizo-affective (HCC)-resolved as of 06/13/2022 - cont home lamictal, zyprexa, lexapro  Essential hypertension-resolved as of 06/13/2022 BP stable - Continue home amlodipine and carvedilol BID  COPD (chronic obstructive pulmonary disease) (HCC)-resolved as of 06/13/2022 ORA. O2 goal 88-92% - formulary equivalent of home Trelegy and albuterol  Chronic painful diabetic neuropathy (HCC)-resolved as of 06/13/2022 - Pregabalin 75mg  daily (renally dosed) - Tylenol prn  Dyslipidemia-resolved as of 06/13/2022 Cont home atorvastatin       FEN/GI: Heart healthy PPx: Heparin Dispo:MRI and clinical improvement  Subjective:  Patient seen this morning.  Patient is asking for pain meds stronger than Tylenol.  Discussed with her the risks of adding on a stronger pain medications but she is continuing to ask for pain meds for her leg pain however prior to discussing pain medications, I was able to palpate her legs deeply.  When speaking about pain medications, she reports much pain on light touch.  She continues to refuse getting her MRI understands the risks.  She says "I would rather get that in another city as you guys are not medicating me".  Objective: Temp:  [97.6 F (36.4 C)-98.6 F (37 C)] 97.6 F (36.4 C) (05/28 0456) Pulse Rate:  [60-63] 60 (05/28 0456) Resp:  [14] 14 (05/28 0456) BP: (123-139)/(68-82) 139/82 (05/28 0456) SpO2:  [94 %] 94 % (05/28 0820) Weight:  [93.8 kg] 93.8 kg (05/28 0456) Physical Exam: General: Chronically ill-appearing female in bed, NAD Cardiovascular: RRR, no murmurs Respiratory: CTA anteriorly, normal effort Extremities:  Nontender initially but later endorsed pain  Laboratory: Most recent CBC Lab Results  Component Value Date   WBC 4.5 06/13/2022   HGB 8.6 (L) 06/13/2022   HCT 28.4 (L) 06/13/2022   MCV 92.2 06/13/2022   PLT 229  06/13/2022   Most recent BMP    Latest Ref Rng & Units 06/13/2022    7:18 AM  BMP  Glucose 70 - 99 mg/dL 82   BUN 8 - 23 mg/dL 81   Creatinine 1.61 - 1.00 mg/dL 0.96   Sodium 045 - 409 mmol/L 137   Potassium 3.5 - 5.1 mmol/L 3.8   Chloride 98 - 111 mmol/L 102   CO2 22 - 32 mmol/L 23   Calcium 8.9 - 10.3 mg/dL 8.3     Lance Muss, MD 06/13/2022, 8:58 AM  PGY-1, [place] [facility] FPTS Intern pager: [phone], text pages welcome Secure chat group [facility] Teaching Service

## 2022-06-13 NOTE — Telephone Encounter (Addendum)
[name] heard from [facility] who reports they examined the patients property and they are unable to complete a ramp due to the set up of the apartment.  In order to be to code they would have to do a 72ft ramp which would cover the side walk which wouldn't be allowed.  Unfortunately no other options at this time.  [name] attempted to call pt x4 to inform of inability to get ramp done with this organization.  Will continue to attempt efforts to contact- unable to leave VM.  Burna Sis, LCSW Clinical Social Worker [facility] Desk#: [phone] Cell#: [phone]

## 2022-06-13 NOTE — Discharge Summary (Addendum)
Family Medicine Teaching [facility] Discharge Summary  Patient name: Tiffany [name] Medical record number: [id number] Date of birth: 11-10-1960 Age: 62 y.o. Gender: female Date of Admission: 06/10/2022  Date of Discharge: 06/13/2022 Admitting Physician: Doreene Eland, MD  Primary Care Provider: Tiffany Kocher, DO Consultants: None   Indication for Hospitalization: Hip pain  Discharge Diagnoses/Problem List:  Principal Problem for Admission: Hip pain Other Problems addressed during stay:  Principal Problem:   Hip pain Active Problems:   Acute renal failure superimposed on stage 4 chronic kidney disease (HCC)   OSA and COPD overlap syndrome (HCC)   Hypersomnia, Chronic   Chronic kidney disease (CKD), stage IV (severe) (HCC)   Acute on chronic diastolic heart failure (HCC)   Lower extremity weakness   Severe Spinal stenosis, lumbar    Brief Hospital Course:   Hypersomnia, Chronic  Medication toxicity is supported by history of polypharmacy and recent hospitalization for AMS thought to be due to overmedication. Cymbalta and Sapharis were stopped during 5/17 admission. Pregabalin was decreased due to AKI. From chart review, it looks like there have been attempts to wean this med to 50 TID but patient was taking more than prescribed. Substance toxicity/withdrawal are considered given history of UDS positive for cocaine in 02/2022. CT Head was normal. Fluctuating somnolence and fully alert and oriented on day of discharge, seems like a pattern for [name].   Hip pain Neuropathic pain is considered given pt's hx of neuropathy for which she take Pregabalin and "burning" nature of hip pain. Osteopenia on radiologic imaging. History of L3-4 destruction on CT AP in ED visit 04/24/21 for back pain.  The CT also showed with severe lumbar stenosis.  Dr Franky Macho (NS) saw patient and he notes her lethargy and inability to follow commands.  He thought she would not be a good candidate  for surgery.  Additionally, Ms Voils did not want the surgery. A CT follow up four months later showed progressive destruction of L3-4 c/w chronic osteomyelitis/discitis. She refused to obtain a follow-up CT and understands the risks of refusal.  Acute on chronic diastolic heart failure  Initially volume overloaded and gave IV lasix. Patient produced 1.5 L urine and lasix was discontinued and her home torsemide was restarted.  Other chronic conditions were medically managed with home medications and formulary alternatives as necessary (schizophrenia, HTN, COPD, diabetic neuropathy)  PCP Follow-up Recommendations: Assess mentation Encourage spine imaging Blood pressure needs (on amlodipine and carvedilol) Respiratory requirements (home Trelegy and albuterol) Neuropathic pain - renally dosed Lyrica Hyperlipidemia - recheck lipid panel   Disposition: Home  Discharge Condition: Stable  Discharge Exam:  Vitals:   06/13/22 0820 06/13/22 1139  BP:  (!) 148/92  Pulse:  67  Resp:  15  Temp:  98.2 F (36.8 C)  SpO2: 94%    Physical Exam: General: Chronically ill-appearing female in bed, NAD Cardiovascular: RRR, no murmurs Respiratory: CTA anteriorly, normal effort Extremities: Nontender initially but later endorsed pain  Significant Procedures: none  Significant Labs and Imaging:  Recent Labs  Lab 06/12/22 0219 06/13/22 0718  WBC 4.5 4.5  HGB 7.7* 8.6*  HCT 25.2* 28.4*  PLT 222 229   Recent Labs  Lab 06/12/22 0219 06/13/22 0718  NA 137 137  K 3.7 3.8  CL 104 102  CO2 23 23  GLUCOSE 101* 82  BUN 81* 81*  CREATININE 4.74* 4.58*  CALCIUM 8.2* 8.3*    CT head 5/25 IMPRESSION: Normal head CT.  Abdominal XR  5/26 IMPRESSION: 1. Severely underpenetrated AP portable abdominal radiograph. Large burden of stool throughout the colon and rectum. Nonobstructive bowel gas pattern.   2. No obvious calculi, assessment very limited by underpenetration and overlying  stool burden.  Results/Tests Pending at Time of Discharge: none  Discharge Medications:  Allergies as of 06/13/2022       Reactions   Asa [aspirin] Other (See Comments)   GI bleeds   Hydrocodone Shortness Of Breath   Latuda [lurasidone Hcl] Anaphylaxis   Magnesium-containing Compounds Anaphylaxis, Other (See Comments)   Tolerated Ensure   Prednisone Anaphylaxis, Swelling   Swelling of tongue, lips, throat   Ultram [tramadol] Anaphylaxis, Swelling, Rash   Codeine Nausea And Vomiting, Rash   Desyrel [trazodone] Other (See Comments)   Paranoia    Peanut (diagnostic) Hives   Raw peanuts only  OK to eat peanut butter.   Topamax [topiramate] Other (See Comments)   Paranoia   Sulfa Antibiotics Itching   Tape Rash   Zetia [ezetimibe] Itching, Rash        Medication List     STOP taking these medications    cyclobenzaprine 10 MG tablet Commonly known as: FLEXERIL   furosemide 80 MG tablet Commonly known as: LASIX   melatonin 3 MG Tabs tablet       TAKE these medications    albuterol 108 (90 Base) MCG/ACT inhaler Commonly known as: VENTOLIN HFA Inhale 2 puffs into the lungs every 6 (six) hours as needed for wheezing or shortness of breath.   amLODipine 10 MG tablet Commonly known as: NORVASC Take 10 mg by mouth daily.   atorvastatin 20 MG tablet Commonly known as: LIPITOR Take 1 tablet (20 mg total) by mouth daily.   carvedilol 12.5 MG tablet Commonly known as: COREG Take 1 tablet (12.5 mg total) by mouth 2 (two) times daily.   Centrum Silver 50+Women Tabs Take 1 tablet by mouth daily.   cyanocobalamin 1000 MCG tablet Take 1 tablet (1,000 mcg total) by mouth daily.   dicyclomine 10 MG capsule Commonly known as: BENTYL Take 10 mg by mouth 3 (three) times daily.   escitalopram 10 MG tablet Commonly known as: LEXAPRO Take 1 tablet (10 mg total) by mouth daily. What changed:  medication strength how much to take   FeroSul 325 (65 FE) MG  tablet Generic drug: ferrous sulfate Take 1 tablet (325 mg total) by mouth every other day. What changed: when to take this   folic acid 1 MG tablet Commonly known as: FOLVITE Take 1 tablet (1 mg total) by mouth daily.   lamoTRIgine 100 MG tablet Commonly known as: LAMICTAL Take 100 mg by mouth daily.   loperamide 2 MG capsule Commonly known as: IMODIUM Take 2 mg by mouth 3 (three) times daily as needed for diarrhea or loose stools.   OLANZapine 10 MG tablet Commonly known as: ZYPREXA Take 10 mg by mouth at bedtime.   omeprazole 40 MG capsule Commonly known as: PRILOSEC Take 1 capsule (40 mg total) by mouth every morning.   oxyCODONE 5 MG immediate release tablet Commonly known as: Roxicodone Take 0.5 tablets (2.5 mg total) by mouth every 6 (six) hours as needed for up to 5 days for severe pain.   prazosin 1 MG capsule Commonly known as: MINIPRESS Take 2 capsules (2 mg total) by mouth at bedtime. What changed:  medication strength how much to take how to take this when to take this additional instructions   pregabalin 75 MG capsule Commonly  known as: LYRICA Take 1 capsule (75 mg total) by mouth daily. What changed:  medication strength how much to take when to take this   torsemide 20 MG tablet Commonly known as: DEMADEX Take 1 tablet (20 mg total) by mouth 2 (two) times daily. What changed:  how much to take when to take this   Trelegy Ellipta 100-62.5-25 MCG/ACT Aepb Generic drug: Fluticasone-Umeclidin-Vilant INHALE 1 PUFF INTO THE LUNGS DAILY               Durable Medical Equipment  (From admission, onward)           Start     Ordered   06/12/22 1553  For home use only DME standard manual wheelchair with seat cushion  Once       Comments: Patient suffers from lower extremity deconditioning which impairs their ability to perform daily activities like bathing, grooming, and toileting in the home.  A cane, crutch, or walker will not resolve  issue with performing activities of daily living. A wheelchair will allow patient to safely perform daily activities. Patient can safely propel the wheelchair in the home or has a caregiver who can provide assistance. Length of need Lifetime. Accessories: elevating leg rests (ELRs), wheel locks, extensions and anti-tippers.   06/12/22 1553            Discharge Instructions: Please refer to Patient Instructions section of EMR for full details.  Patient was counseled important signs and symptoms that should prompt return to medical care, changes in medications, dietary instructions, activity restrictions, and follow up appointments.   Follow-Up Appointments:  Follow-up Information     Tiffany Kocher, DO. Go on 06/22/2022.   Specialty: Family Medicine Contact information: [address] [employer] [place] [address] [phone]         Jobe Gibbon, [facility] Follow up.   Specialty: Home Health Services Why: Office to call with visit times. Contact information: [address] [place] [address] [phone]         [employer] Follow up.   Why: Wheel Chair. Contact information: Located in: [employer] Address: [address], [place], [place] [address] Phone: [phone]                Lance Muss, MD 06/13/2022, 12:06 PM PGY-1, [facility] Family Medicine

## 2022-06-13 NOTE — Telephone Encounter (Signed)
Requested medications are due for refill today.  no  Requested medications are on the active medications list.  no  Last refill. 06/2021  Future visit scheduled.   no  Notes to clinic.  Tiffany Kocher listed as PCP.    Requested Prescriptions  Pending Prescriptions Disp Refills   Potassium Chloride ER 20 MEQ TBCR [Pharmacy Med Name: POTASSIUM CHLORIDE ER TABLETS] 30 tablet     Sig: TAKE 1 TABLET BY MOUTH EVERY DAY     Endocrinology:  Minerals - Potassium Supplementation Failed - 06/10/2022  3:58 AM      Failed - Cr in normal range and within 360 days    Creat  Date Value Ref Range Status  02/18/2016 1.22 (H) 0.50 - 1.05 mg/dL Final    Comment:      For patients > or = 63 years of age: The upper reference limit for Creatinine is approximately 13% higher for people identified as African-American.      Creatinine, Ser  Date Value Ref Range Status  06/12/2022 4.74 (H) 0.44 - 1.00 mg/dL Final   Creatinine,U  Date Value Ref Range Status  06/17/2012 521.94 mg/dL Final    Comment:    Result confirmed by automatic dilution.    Cutoff Values for Urine Drug Screen, Pain Mgmt          Drug Class           Cutoff (ng/mL)          Amphetamines             500          Barbiturates             200          Cocaine Metabolites      150          Benzodiazepines          200          Methadone                300          Opiates                  300          Phencyclidine             25          Propoxyphene             300          Marijuana Metabolites     50    For medical purposes only.   Creatinine, Urine  Date Value Ref Range Status  06/05/2022 48 mg/dL Final         Failed - Valid encounter within last 12 months    Recent Outpatient Visits           11 months ago Frequent falls   [facility] Hoy Register, MD   1 year ago Lumbar radiculopathy   [facility] Hoy Register, MD   1 year  ago Weakness of both lower extremities    [facility] Hoy Register, MD   1 year ago Edema of both legs   [facility] Claiborne Rigg, NP   1 year ago Hospital discharge follow-up   [facility] Ms Wyatt, [place],  NP       Future Appointments             Today [facility]             Passed - K in normal range and within 360 days    Potassium  Date Value Ref Range Status  06/12/2022 3.7 3.5 - 5.1 mmol/L Final

## 2022-06-13 NOTE — Progress Notes (Signed)
Explained discharge instructions to patient. Reviewed follow up appointment and next medication administration times. Also reviewed education. Patient verbalized having an understanding for instructions given. All belongings are in the patient's possession to include TOC meds to include her wheelchair delivered to the room by [employer].. IV and telemetry were removed by the floor staff prior to my explaining her discharge instructions. CCMD was notified. No other needs verbalized. Patient is awaiting transportation home. Notified the patient's RN.  Orvan Seen SWOT RN

## 2022-06-13 NOTE — Progress Notes (Signed)
Physical Therapy Treatment Patient Details Name: Tiffany [name] MRN: [id number] DOB: June 19, 1960 Today's Date: 06/13/2022   History of Present Illness Pt is a 62 y.o. female presenting with pain in bilateral hips, and noted to be fluid overloaded and somnolent in ED. Pt reports burning pain in BL hips that started yesterday. Has been getting worse since. Saw Dr. Robyne Peers in clinic and was restarted on torsemide. She recently was off of the torsemide (see summary of prior admission). Then this morning she fell while trying to transfer from wheelchair and had to crawl to a phone to call for help. EMS brought her to ED. She also reports some shortness of breath, but says it is not bad MH includes HTN, CHF, COPD, DM2, CKD, PTSD, gout, seizures, CVA, anoxic brain injury, schizophrenia/schizoaffective disorder, CAD s/p MI x2, HLD, and drug abuse (cocaine).    PT Comments    Pt with much improved mobility and able to transfer to/from chair with supervision only. Pt declined SNF and plans to return home.    Recommendations for follow up therapy are one component of a multi-disciplinary discharge planning process, led by the attending physician.  Recommendations may be updated based on patient status, additional functional criteria and insurance authorization.  Follow Up Recommendations       Assistance Recommended at Discharge Intermittent Supervision/Assistance  Patient can return home with the following A little help with bathing/dressing/bathroom;Assistance with cooking/housework;Assist for transportation;Help with stairs or ramp for entrance   Equipment Recommendations  Wheelchair (measurements PT);Wheelchair cushion (measurements PT) (Pt's w/c was in disrepair)    Recommendations for Other Services       Precautions / Restrictions Precautions Precautions: Fall Restrictions Weight Bearing Restrictions: No     Mobility  Bed Mobility Overal bed mobility: Needs Assistance Bed Mobility:  Supine to Sit, Sit to Supine     Supine to sit: Supervision, HOB elevated Sit to supine: Min assist   General bed mobility comments: Assist to bring legs back up into bed    Transfers Overall transfer level: Needs assistance Equipment used: None Transfers: Bed to chair/wheelchair/BSC            Lateral/Scoot Transfers: Supervision General transfer comment: Supervision for safety and to hold chair steady. Pt performed bed to chair to bed    Ambulation/Gait                   Stairs             Wheelchair Mobility    Modified Rankin (Stroke Patients Only)       Balance Overall balance assessment: Needs assistance Sitting-balance support: Feet supported Sitting balance-Leahy Scale: Good                                      Cognition Arousal/Alertness: Awake/alert Behavior During Therapy: WFL for tasks assessed/performed Overall Cognitive Status: Within Functional Limits for tasks assessed                                          Exercises      General Comments        Pertinent Vitals/Pain      Home Living  Prior Function            PT Goals (current goals can now be found in the care plan section) Progress towards PT goals: Progressing toward goals    Frequency    Min 1X/week      PT Plan Discharge plan needs to be updated;Frequency needs to be updated    Co-evaluation              AM-PAC PT "6 Clicks" Mobility   Outcome Measure  Help needed turning from your back to your side while in a flat bed without using bedrails?: None Help needed moving from lying on your back to sitting on the side of a flat bed without using bedrails?: A Little Help needed moving to and from a bed to a chair (including a wheelchair)?: A Little Help needed standing up from a chair using your arms (e.g., wheelchair or bedside chair)?: A Lot Help needed to walk in hospital  room?: Total Help needed climbing 3-5 steps with a railing? : Total 6 Click Score: 14    End of Session   Activity Tolerance: Patient tolerated treatment well Patient left: in chair;with call bell/phone within reach;with bed alarm set;with nursing/sitter in room Nurse Communication: Mobility status PT Visit Diagnosis: Other abnormalities of gait and mobility (R26.89)     Time: 1610-9604 PT Time Calculation (min) (ACUTE ONLY): 13 min  Charges:  $Therapeutic Activity: 8-22 mins                     [facility] PT Acute Rehabilitation Services Office [phone]    Angelina Ok [facility] 06/13/2022, 11:16 AM

## 2022-06-13 NOTE — Progress Notes (Signed)
Pt has been refusing sub-q heparin overnight.  Dr. Elberta Fortis notified.  Tiffany [name]

## 2022-06-13 NOTE — Plan of Care (Signed)
  Problem: Cardiac: Goal: Ability to achieve and maintain adequate cardiopulmonary perfusion will improve Outcome: Progressing   

## 2022-06-13 NOTE — Discharge Instructions (Addendum)
Dear Tiffany [name],   Thank you so much for allowing [name] to be part of your care!  You were admitted to [facility] for leg weakness. We gave you diuretics to get fluid off your body and your leg swelling improved. We also highly recommend that you get imaging of your spine, please follow-up with your primary care provider.  POST-HOSPITAL & CARE INSTRUCTIONS Please take all medications as prescribed. Please let PCP/Specialists know of any changes that were made.  Please see medications section of this packet for any medication changes.   DOCTOR'S APPOINTMENT & FOLLOW UP CARE INSTRUCTIONS  Future Appointments  Date Time Provider Department Center  06/13/2022  9:30 AM ACCESS TO CARE POOL [id number]    RETURN PRECAUTIONS:   Take care and be well!  Family Medicine Teaching Service  [place]ary  [facility]  [address] [place], [place] [address] [phone]

## 2022-06-13 NOTE — Telephone Encounter (Signed)
Entered in error

## 2022-06-13 NOTE — TOC Progression Note (Addendum)
Transition of Care [facility]) - Progression Note    Patient Details  Name: Tiffany [name] MRN: [id number] Date of Birth: 1960-11-18  [facility] CM/SW Contact  Graves-Bigelow, Lamar Laundry, RN Phone Number: 06/13/2022, 11:08 AM  Clinical Narrative:   Case Manager spoke with the patient and the wheelchair has not been delivered. Case Manager called [employer] and they will deliver to the room shortly. No further needs identified at this time. Per patient, her mom will transport home.   06-13-22 1516 Case Manager received an update for the patient and she wanted ambulance transport home. Patient is now stating that her mom will transport home and then when she gets there she will call EMS to help assist her into the home. Case Manager was willing to call [facility] and patient is declining services. Staff RN is aware. No further needs identified at this time.   Expected Discharge Plan: Home w Home Health Services Barriers to Discharge: Continued Medical Work up  Expected Discharge Plan and Services In-house Referral: Clinical Social Work     Living arrangements for the past 2 months: Apartment Expected Discharge Date: 06/13/22               DME Arranged: Wheelchair manual DME Agency: [employer] Date DME Agency Contacted: 06/12/22 Time DME Agency Contacted: [phone] Representative spoke with at DME Agency: Vaughan Basta             Social Determinants of Health (SDOH) Interventions SDOH Screenings   Food Insecurity: No Food Insecurity (06/10/2022)  Recent Concern: Food Insecurity - Food Insecurity Present (06/09/2022)  Housing: Low Risk  (06/10/2022)  Transportation Needs: No Transportation Needs (06/10/2022)  Utilities: Not At Risk (06/10/2022)  Alcohol Screen: Low Risk  (07/24/2021)  Depression (PHQ2-9): Low Risk  (06/09/2022)  Recent Concern: Depression (PHQ2-9) - Medium Risk (04/03/2022)  Financial Resource Strain: Medium Risk (07/24/2021)  Physical Activity: Inactive (07/24/2021)   Social Connections: Moderately Isolated (07/24/2021)  Stress: Stress Concern Present (07/24/2021)  Tobacco Use: Medium Risk (06/12/2022)    Readmission Risk Interventions    06/05/2022    2:47 PM 02/07/2022    2:22 PM 05/24/2021    6:19 PM  Readmission Risk Prevention Plan  Transportation Screening Complete Complete Complete  Medication Review (RN Care Manager) Referral to Pharmacy Complete Referral to Pharmacy  PCP or Specialist appointment within 3-5 days of discharge Complete Complete Complete  HRI or Home Care Consult Complete Complete Complete  SW Recovery Care/Counseling Consult Complete Complete Complete  Palliative Care Screening Not Applicable Not Applicable   Skilled Nursing Facility Not Applicable Not Applicable Complete

## 2022-06-15 ENCOUNTER — Telehealth: Payer: Self-pay

## 2022-06-15 NOTE — Transitions of Care (Post Inpatient/ED Visit) (Addendum)
06/15/2022  Name: Tiffany [name] MRN: [id number] DOB: [birth year]  Today's TOC FU Call Status: Today's TOC FU Call Status:: Successful TOC FU Call Competed TOC FU Call Complete Date: 06/15/22  Transition Care Management Follow-up Telephone Call Date of Discharge: 06/13/22 Discharge Facility: [facility]) Type of Discharge: Inpatient Admission Primary Inpatient Discharge Diagnosis:: Hip Pain How have you been since you were released from the hospital?: Same Any questions or concerns?: Yes Patient Questions/Concerns:: My legs still hurt Patient Questions/Concerns Addressed: Notified Provider of Patient Questions/Concerns  Items Reviewed: Did you receive and understand the discharge instructions provided?: Yes Medications obtained,verified, and reconciled?: Yes (Medications Reviewed) Any new allergies since your discharge?: No Dietary orders reviewed?: No Do you have support at home?: Yes People in Home: parent(s) Name of Support/Comfort Primary Source: Elderly Mother  Medications Reviewed Today: Medications Reviewed Today     Reviewed by Jodelle Gross, RN (Case Manager) on 06/15/22 at [phone]  Med List Status: <None>   Medication Order Taking? Sig Documenting Provider Last Dose Status Informant  albuterol (VENTOLIN HFA) 108 (90 Base) MCG/ACT inhaler [id number] Yes Inhale 2 puffs into the lungs every 6 (six) hours as needed for wheezing or shortness of breath. Tiffany Kocher, DO Taking Active Self, Pharmacy Records  amLODipine (NORVASC) 10 MG tablet [id number] Yes Take 10 mg by mouth daily. [provider] Taking Active Self, Pharmacy Records  atorvastatin (LIPITOR) 20 MG tablet [id number] Yes Take 1 tablet (20 mg total) by mouth daily. Tiffany Kocher, DO Taking Active Self, Pharmacy Records  carvedilol (COREG) 12.5 MG tablet [id number] Yes Take 1 tablet (12.5 mg total) by mouth 2 (two) times daily. Pricilla Riffle, MD Taking Active Self, Pharmacy Records  cyanocobalamin  1000 MCG tablet [id number] No Take 1 tablet (1,000 mcg total) by mouth daily.  Patient not taking: Reported on 06/10/2022   Lorin Glass, MD Not Taking Active Self, Pharmacy Records  dicyclomine (BENTYL) 10 MG capsule [id number] Yes Take 10 mg by mouth 3 (three) times daily. [provider] Taking Active Self, Pharmacy Records  escitalopram (LEXAPRO) 10 MG tablet [id number] Yes Take 1 tablet (10 mg total) by mouth daily. Darral Dash, DO Taking Active   ferrous sulfate 325 (65 FE) MG tablet [id number] Yes Take 1 tablet (325 mg total) by mouth every other day. Darral Dash, DO Taking Active   Fluticasone-Umeclidin-[facility] ELLIPTA) 100-62.5-25 MCG/ACT AEPB [id number] Yes INHALE 1 PUFF INTO THE LUNGS DAILY Tiffany Kocher, DO Taking Active Self, Pharmacy Records  folic acid (FOLVITE) 1 MG tablet [id number] Yes Take 1 tablet (1 mg total) by mouth daily. Lorin Glass, MD Taking Active Self, Pharmacy Records  lamoTRIgine (LAMICTAL) 100 MG tablet [id number] Yes Take 100 mg by mouth daily. [provider] Taking Active Self, Pharmacy Records  loperamide (IMODIUM) 2 MG capsule [id number] No Take 2 mg by mouth 3 (three) times daily as needed for diarrhea or loose stools. [provider] Unknown Active Self, Pharmacy Records  Multiple Vitamins-Minerals (CENTRUM SILVER 50+WOMEN) TABS [id number] Yes Take 1 tablet by mouth daily. [provider] Taking Active Self, Pharmacy Records  OLANZapine (ZYPREXA) 10 MG tablet [id number] Yes Take 10 mg by mouth at bedtime. [provider] Taking Active Self, Pharmacy Records  omeprazole (PRILOSEC) 40 MG capsule [id number] Yes Take 1 capsule (40 mg total) by mouth every morning. Tiffany Kocher, DO Taking Active Self, Pharmacy Records  oxyCODONE (ROXICODONE) 5 MG immediate release tablet [id number] Yes Take 0.5 tablets (2.5 mg total) by mouth every 6 (six) hours  as needed for up to 5 days for severe pain. Darral Dash, DO Taking  Active   prazosin (MINIPRESS) 1 MG capsule [id number] Yes Take 2 capsules (2 mg total) by mouth at bedtime. Darral Dash, DO Taking Active   pregabalin (LYRICA) 75 MG capsule [id number] Yes Take 1 capsule (75 mg total) by mouth daily. Darral Dash, DO Taking Active   torsemide (DEMADEX) 20 MG tablet [id number] Yes Take 1 tablet (20 mg total) by mouth 2 (two) times daily. Darral Dash, DO Taking Active             Home Care and Equipment/Supplies: Were Home Health Services Ordered?: Yes Name of Home Health Agency:: [facility] Has Agency set up a time to come to your home?: No EMR reviewed for Home Health Orders:  (Patient was current with [facility]) Any new equipment or medical supplies ordered?: Yes Name of Medical supply agency?: [employer] Were you able to get the equipment/medical supplies?: Yes Do you have any questions related to the use of the equipment/supplies?: No  Functional Questionnaire: Do you need assistance with bathing/showering or dressing?: Yes Do you need assistance with meal preparation?: Yes Do you need assistance with eating?: No Do you have difficulty maintaining continence: No Do you need assistance with getting out of bed/getting out of a chair/moving?: Yes Do you have difficulty managing or taking your medications?: No  Follow up appointments reviewed: PCP Follow-up appointment confirmed?: Yes Date of PCP follow-up appointment?: 06/22/22 Follow-up Provider: Dr. Claudean Severance Specialist [facility] Follow-up appointment confirmed?: NA Do you need transportation to your follow-up appointment?: No Do you understand care options if your condition(s) worsen?: Yes-patient verbalized understanding  SDOH Interventions Today    Flowsheet Row Most Recent Value  SDOH Interventions   Housing Interventions Intervention Not Indicated  Transportation Interventions Intervention Not Indicated  Utilities Interventions Intervention Not Indicated     Jodelle Gross, RN,  BSN, CCM Care Management Coordinator [facility] Phone: [phone]/Fax: [phone]

## 2022-06-16 ENCOUNTER — Other Ambulatory Visit: Payer: Self-pay | Admitting: Student

## 2022-06-17 DIAGNOSIS — E1141 Type 2 diabetes mellitus with diabetic mononeuropathy: Secondary | ICD-10-CM | POA: Diagnosis not present

## 2022-06-17 DIAGNOSIS — I5032 Chronic diastolic (congestive) heart failure: Secondary | ICD-10-CM | POA: Diagnosis not present

## 2022-06-17 DIAGNOSIS — E1122 Type 2 diabetes mellitus with diabetic chronic kidney disease: Secondary | ICD-10-CM | POA: Diagnosis not present

## 2022-06-17 DIAGNOSIS — N1832 Chronic kidney disease, stage 3b: Secondary | ICD-10-CM | POA: Diagnosis not present

## 2022-06-17 DIAGNOSIS — I130 Hypertensive heart and chronic kidney disease with heart failure and stage 1 through stage 4 chronic kidney disease, or unspecified chronic kidney disease: Secondary | ICD-10-CM | POA: Diagnosis not present

## 2022-06-20 ENCOUNTER — Other Ambulatory Visit: Payer: Self-pay | Admitting: Student

## 2022-06-21 ENCOUNTER — Emergency Department (HOSPITAL_COMMUNITY): Payer: 59

## 2022-06-21 ENCOUNTER — Other Ambulatory Visit: Payer: Self-pay

## 2022-06-21 ENCOUNTER — Inpatient Hospital Stay (HOSPITAL_COMMUNITY)
Admission: EM | Admit: 2022-06-21 | Discharge: 2022-06-24 | DRG: 683 | Disposition: A | Discharge status: Home / Self Care | Payer: 59 | Attending: Internal Medicine | Admitting: Internal Medicine

## 2022-06-21 ENCOUNTER — Encounter (HOSPITAL_COMMUNITY): Payer: Self-pay | Admitting: Emergency Medicine

## 2022-06-21 DIAGNOSIS — Z818 Family history of other mental and behavioral disorders: Secondary | ICD-10-CM

## 2022-06-21 DIAGNOSIS — F250 Schizoaffective disorder, bipolar type: Secondary | ICD-10-CM | POA: Diagnosis present

## 2022-06-21 DIAGNOSIS — Z6835 Body mass index (BMI) 35.0-35.9, adult: Secondary | ICD-10-CM

## 2022-06-21 DIAGNOSIS — J9611 Chronic respiratory failure with hypoxia: Secondary | ICD-10-CM | POA: Diagnosis present

## 2022-06-21 DIAGNOSIS — N185 Chronic kidney disease, stage 5: Secondary | ICD-10-CM | POA: Diagnosis not present

## 2022-06-21 DIAGNOSIS — F1421 Cocaine dependence, in remission: Secondary | ICD-10-CM | POA: Diagnosis present

## 2022-06-21 DIAGNOSIS — G4710 Hypersomnia, unspecified: Secondary | ICD-10-CM | POA: Diagnosis not present

## 2022-06-21 DIAGNOSIS — M159 Polyosteoarthritis, unspecified: Secondary | ICD-10-CM | POA: Diagnosis not present

## 2022-06-21 DIAGNOSIS — E1142 Type 2 diabetes mellitus with diabetic polyneuropathy: Secondary | ICD-10-CM | POA: Diagnosis present

## 2022-06-21 DIAGNOSIS — I132 Hypertensive heart and chronic kidney disease with heart failure and with stage 5 chronic kidney disease, or end stage renal disease: Secondary | ICD-10-CM | POA: Diagnosis present

## 2022-06-21 DIAGNOSIS — D631 Anemia in chronic kidney disease: Secondary | ICD-10-CM | POA: Diagnosis present

## 2022-06-21 DIAGNOSIS — J439 Emphysema, unspecified: Secondary | ICD-10-CM | POA: Diagnosis not present

## 2022-06-21 DIAGNOSIS — N179 Acute kidney failure, unspecified: Secondary | ICD-10-CM | POA: Diagnosis not present

## 2022-06-21 DIAGNOSIS — L89152 Pressure ulcer of sacral region, stage 2: Secondary | ICD-10-CM | POA: Diagnosis not present

## 2022-06-21 DIAGNOSIS — K219 Gastro-esophageal reflux disease without esophagitis: Secondary | ICD-10-CM | POA: Diagnosis not present

## 2022-06-21 DIAGNOSIS — G4733 Obstructive sleep apnea (adult) (pediatric): Secondary | ICD-10-CM | POA: Diagnosis present

## 2022-06-21 DIAGNOSIS — K5730 Diverticulosis of large intestine without perforation or abscess without bleeding: Secondary | ICD-10-CM | POA: Diagnosis not present

## 2022-06-21 DIAGNOSIS — E1140 Type 2 diabetes mellitus with diabetic neuropathy, unspecified: Secondary | ICD-10-CM | POA: Diagnosis present

## 2022-06-21 DIAGNOSIS — E785 Hyperlipidemia, unspecified: Secondary | ICD-10-CM | POA: Diagnosis not present

## 2022-06-21 DIAGNOSIS — F419 Anxiety disorder, unspecified: Secondary | ICD-10-CM | POA: Diagnosis present

## 2022-06-21 DIAGNOSIS — N189 Chronic kidney disease, unspecified: Secondary | ICD-10-CM | POA: Diagnosis not present

## 2022-06-21 DIAGNOSIS — Z87891 Personal history of nicotine dependence: Secondary | ICD-10-CM

## 2022-06-21 DIAGNOSIS — F5104 Psychophysiologic insomnia: Secondary | ICD-10-CM | POA: Diagnosis present

## 2022-06-21 DIAGNOSIS — E1122 Type 2 diabetes mellitus with diabetic chronic kidney disease: Secondary | ICD-10-CM | POA: Diagnosis present

## 2022-06-21 DIAGNOSIS — M48061 Spinal stenosis, lumbar region without neurogenic claudication: Secondary | ICD-10-CM | POA: Diagnosis present

## 2022-06-21 DIAGNOSIS — Z515 Encounter for palliative care: Secondary | ICD-10-CM | POA: Diagnosis not present

## 2022-06-21 DIAGNOSIS — S199XXA Unspecified injury of neck, initial encounter: Secondary | ICD-10-CM | POA: Diagnosis not present

## 2022-06-21 DIAGNOSIS — Z7189 Other specified counseling: Secondary | ICD-10-CM | POA: Diagnosis not present

## 2022-06-21 DIAGNOSIS — Z882 Allergy status to sulfonamides status: Secondary | ICD-10-CM

## 2022-06-21 DIAGNOSIS — R410 Disorientation, unspecified: Secondary | ICD-10-CM | POA: Diagnosis not present

## 2022-06-21 DIAGNOSIS — I2510 Atherosclerotic heart disease of native coronary artery without angina pectoris: Secondary | ICD-10-CM | POA: Diagnosis present

## 2022-06-21 DIAGNOSIS — G894 Chronic pain syndrome: Secondary | ICD-10-CM | POA: Diagnosis not present

## 2022-06-21 DIAGNOSIS — D62 Acute posthemorrhagic anemia: Secondary | ICD-10-CM | POA: Diagnosis not present

## 2022-06-21 DIAGNOSIS — I5032 Chronic diastolic (congestive) heart failure: Secondary | ICD-10-CM | POA: Diagnosis present

## 2022-06-21 DIAGNOSIS — S0990XA Unspecified injury of head, initial encounter: Secondary | ICD-10-CM | POA: Diagnosis not present

## 2022-06-21 DIAGNOSIS — Z79899 Other long term (current) drug therapy: Secondary | ICD-10-CM

## 2022-06-21 DIAGNOSIS — Z993 Dependence on wheelchair: Secondary | ICD-10-CM

## 2022-06-21 DIAGNOSIS — R5381 Other malaise: Secondary | ICD-10-CM | POA: Diagnosis present

## 2022-06-21 DIAGNOSIS — M542 Cervicalgia: Secondary | ICD-10-CM | POA: Diagnosis not present

## 2022-06-21 DIAGNOSIS — J9612 Chronic respiratory failure with hypercapnia: Secondary | ICD-10-CM | POA: Diagnosis not present

## 2022-06-21 DIAGNOSIS — I5033 Acute on chronic diastolic (congestive) heart failure: Secondary | ICD-10-CM | POA: Diagnosis not present

## 2022-06-21 DIAGNOSIS — J4489 Other specified chronic obstructive pulmonary disease: Secondary | ICD-10-CM | POA: Diagnosis present

## 2022-06-21 DIAGNOSIS — Z8249 Family history of ischemic heart disease and other diseases of the circulatory system: Secondary | ICD-10-CM

## 2022-06-21 DIAGNOSIS — I252 Old myocardial infarction: Secondary | ICD-10-CM

## 2022-06-21 DIAGNOSIS — Z91048 Other nonmedicinal substance allergy status: Secondary | ICD-10-CM

## 2022-06-21 DIAGNOSIS — I130 Hypertensive heart and chronic kidney disease with heart failure and stage 1 through stage 4 chronic kidney disease, or unspecified chronic kidney disease: Secondary | ICD-10-CM | POA: Diagnosis not present

## 2022-06-21 DIAGNOSIS — W19XXXA Unspecified fall, initial encounter: Secondary | ICD-10-CM | POA: Diagnosis not present

## 2022-06-21 DIAGNOSIS — Z885 Allergy status to narcotic agent status: Secondary | ICD-10-CM

## 2022-06-21 DIAGNOSIS — R079 Chest pain, unspecified: Secondary | ICD-10-CM | POA: Diagnosis not present

## 2022-06-21 DIAGNOSIS — R296 Repeated falls: Secondary | ICD-10-CM | POA: Diagnosis present

## 2022-06-21 DIAGNOSIS — M4626 Osteomyelitis of vertebra, lumbar region: Secondary | ICD-10-CM | POA: Diagnosis not present

## 2022-06-21 DIAGNOSIS — F17200 Nicotine dependence, unspecified, uncomplicated: Secondary | ICD-10-CM

## 2022-06-21 DIAGNOSIS — G931 Anoxic brain damage, not elsewhere classified: Secondary | ICD-10-CM | POA: Diagnosis not present

## 2022-06-21 DIAGNOSIS — R404 Transient alteration of awareness: Secondary | ICD-10-CM | POA: Diagnosis not present

## 2022-06-21 DIAGNOSIS — Z888 Allergy status to other drugs, medicaments and biological substances status: Secondary | ICD-10-CM

## 2022-06-21 DIAGNOSIS — Z7951 Long term (current) use of inhaled steroids: Secondary | ICD-10-CM

## 2022-06-21 DIAGNOSIS — Z743 Need for continuous supervision: Secondary | ICD-10-CM | POA: Diagnosis not present

## 2022-06-21 DIAGNOSIS — Z90710 Acquired absence of both cervix and uterus: Secondary | ICD-10-CM

## 2022-06-21 DIAGNOSIS — F4312 Post-traumatic stress disorder, chronic: Secondary | ICD-10-CM | POA: Diagnosis present

## 2022-06-21 DIAGNOSIS — Z8673 Personal history of transient ischemic attack (TIA), and cerebral infarction without residual deficits: Secondary | ICD-10-CM

## 2022-06-21 DIAGNOSIS — Z91010 Allergy to peanuts: Secondary | ICD-10-CM

## 2022-06-21 DIAGNOSIS — Z8674 Personal history of sudden cardiac arrest: Secondary | ICD-10-CM

## 2022-06-21 DIAGNOSIS — Z5986 Financial insecurity: Secondary | ICD-10-CM

## 2022-06-21 DIAGNOSIS — M1030 Gout due to renal impairment, unspecified site: Secondary | ICD-10-CM | POA: Diagnosis not present

## 2022-06-21 DIAGNOSIS — J811 Chronic pulmonary edema: Secondary | ICD-10-CM | POA: Diagnosis not present

## 2022-06-21 DIAGNOSIS — Z886 Allergy status to analgesic agent status: Secondary | ICD-10-CM

## 2022-06-21 DIAGNOSIS — N184 Chronic kidney disease, stage 4 (severe): Secondary | ICD-10-CM | POA: Diagnosis not present

## 2022-06-21 DIAGNOSIS — Z9181 History of falling: Secondary | ICD-10-CM

## 2022-06-21 LAB — CBC
HCT: 28.8 % — ABNORMAL LOW (ref 36.0–46.0)
Hemoglobin: 8.6 g/dL — ABNORMAL LOW (ref 12.0–15.0)
MCH: 27.5 pg (ref 26.0–34.0)
MCHC: 29.9 g/dL — ABNORMAL LOW (ref 30.0–36.0)
MCV: 92 fL (ref 80.0–100.0)
Platelets: 236 10*3/uL (ref 150–400)
RBC: 3.13 MIL/uL — ABNORMAL LOW (ref 3.87–5.11)
RDW: 18 % — ABNORMAL HIGH (ref 11.5–15.5)
WBC: 5 10*3/uL (ref 4.0–10.5)
nRBC: 0 % (ref 0.0–0.2)

## 2022-06-21 LAB — HEPATIC FUNCTION PANEL
ALT: 12 U/L (ref 0–44)
AST: 23 U/L (ref 15–41)
Albumin: 3.8 g/dL (ref 3.5–5.0)
Alkaline Phosphatase: 138 U/L — ABNORMAL HIGH (ref 38–126)
Bilirubin, Direct: 0.2 mg/dL (ref 0.0–0.2)
Indirect Bilirubin: 0.2 mg/dL — ABNORMAL LOW (ref 0.3–0.9)
Total Bilirubin: 0.4 mg/dL (ref 0.3–1.2)
Total Protein: 7.2 g/dL (ref 6.5–8.1)

## 2022-06-21 LAB — BASIC METABOLIC PANEL
Anion gap: 11 (ref 5–15)
BUN: 107 mg/dL — ABNORMAL HIGH (ref 8–23)
CO2: 21 mmol/L — ABNORMAL LOW (ref 22–32)
Calcium: 8.9 mg/dL (ref 8.9–10.3)
Chloride: 105 mmol/L (ref 98–111)
Creatinine, Ser: 5.44 mg/dL — ABNORMAL HIGH (ref 0.44–1.00)
GFR, Estimated: 8 mL/min — ABNORMAL LOW (ref >60)
Glucose, Bld: 113 mg/dL — ABNORMAL HIGH (ref 70–99)
Potassium: 4.9 mmol/L (ref 3.5–5.1)
Sodium: 137 mmol/L (ref 135–145)

## 2022-06-21 LAB — CK: Total CK: 623 U/L — ABNORMAL HIGH (ref 38–234)

## 2022-06-21 LAB — ETHANOL: Alcohol, Ethyl (B): <10 mg/dL (ref <10)

## 2022-06-21 MED ORDER — LAMOTRIGINE 25 MG PO TABS
100.0000 mg | ORAL_TABLET | Freq: Every day | ORAL | Status: DC
  Administered 2022-06-22 – 2022-06-24 (×3): 100 mg via ORAL
  Filled 2022-06-21 (×3): qty 4

## 2022-06-21 MED ORDER — ACETAMINOPHEN 325 MG PO TABS
650.0000 mg | ORAL_TABLET | Freq: Four times a day (QID) | ORAL | Status: DC | PRN
  Filled 2022-06-21: qty 2

## 2022-06-21 MED ORDER — SENNA 8.6 MG PO TABS
1.0000 | ORAL_TABLET | Freq: Two times a day (BID) | ORAL | Status: DC
  Administered 2022-06-21 – 2022-06-24 (×6): 8.6 mg via ORAL
  Filled 2022-06-21 (×6): qty 1

## 2022-06-21 MED ORDER — ATORVASTATIN CALCIUM 10 MG PO TABS
20.0000 mg | ORAL_TABLET | Freq: Every day | ORAL | Status: DC
  Administered 2022-06-22 – 2022-06-24 (×3): 20 mg via ORAL
  Filled 2022-06-21 (×3): qty 2

## 2022-06-21 MED ORDER — POLYETHYLENE GLYCOL 3350 17 G PO PACK: 17.0000 g | PACK | Freq: Every day | ORAL | Status: DC | PRN

## 2022-06-21 MED ORDER — FERROUS SULFATE 325 (65 FE) MG PO TABS
325.0000 mg | ORAL_TABLET | ORAL | Status: DC
  Administered 2022-06-22 – 2022-06-24 (×2): 325 mg via ORAL
  Filled 2022-06-21 (×3): qty 1

## 2022-06-21 MED ORDER — HEPARIN SODIUM (PORCINE) 5000 UNIT/ML IJ SOLN
5000.0000 [IU] | Freq: Three times a day (TID) | INTRAMUSCULAR | Status: DC
  Administered 2022-06-21 – 2022-06-24 (×8): 5000 [IU] via SUBCUTANEOUS
  Filled 2022-06-21 (×9): qty 1

## 2022-06-21 MED ORDER — FOLIC ACID 1 MG PO TABS
1.0000 mg | ORAL_TABLET | Freq: Every day | ORAL | Status: DC
  Administered 2022-06-21 – 2022-06-24 (×4): 1 mg via ORAL
  Filled 2022-06-21 (×4): qty 1

## 2022-06-21 MED ORDER — CARVEDILOL 12.5 MG PO TABS
12.5000 mg | ORAL_TABLET | Freq: Two times a day (BID) | ORAL | Status: DC
  Administered 2022-06-21 – 2022-06-24 (×6): 12.5 mg via ORAL
  Filled 2022-06-21 (×6): qty 1

## 2022-06-21 MED ORDER — OXYCODONE HCL 5 MG PO TABS: 5.0000 mg | ORAL_TABLET | ORAL | Status: DC | PRN

## 2022-06-21 MED ORDER — AMLODIPINE BESYLATE 10 MG PO TABS
10.0000 mg | ORAL_TABLET | Freq: Every day | ORAL | Status: DC
  Administered 2022-06-22 – 2022-06-24 (×3): 10 mg via ORAL
  Filled 2022-06-21 (×3): qty 1

## 2022-06-21 MED ORDER — HYDRALAZINE HCL 20 MG/ML IJ SOLN: 10.0000 mg | Freq: Four times a day (QID) | INTRAMUSCULAR | Status: DC | PRN

## 2022-06-21 MED ORDER — ACETAMINOPHEN 650 MG RE SUPP: 650.0000 mg | Freq: Four times a day (QID) | RECTAL | Status: DC | PRN

## 2022-06-21 MED ORDER — SODIUM CHLORIDE 0.9 % IV SOLN: Freq: Once | INTRAVENOUS | Status: AC

## 2022-06-21 MED ORDER — ALBUTEROL SULFATE (2.5 MG/3ML) 0.083% IN NEBU
2.5000 mg | INHALATION_SOLUTION | Freq: Four times a day (QID) | RESPIRATORY_TRACT | Status: DC
  Filled 2022-06-21: qty 3

## 2022-06-21 MED ORDER — PRAZOSIN HCL 2 MG PO CAPS
2.0000 mg | ORAL_CAPSULE | Freq: Every day | ORAL | Status: DC
  Administered 2022-06-21 – 2022-06-23 (×3): 2 mg via ORAL
  Filled 2022-06-21 (×3): qty 1

## 2022-06-21 NOTE — H&P (Signed)
[facility] History and Physical  Tiffany [name]:[id number] DOB: [birth year] DOA: 06/21/2022 PCP: Tiffany Kocher, DO  Admitted from: Home Chief Complaint: Unwitnessed fall  History of Present Illness: Tiffany [name] is a 62 y.o. female with PMH significant for obesity, OSA, DM2, HTN, HLD, CAD, CHF, bilateral lower extremity edema on diuretics, CKD 5, carotid artery disease, h/o cardiac arrest and subsequent anoxic brain injury, history of drug abuse, COPD, anxiety/depression, schizoaffective disorder, GERD, severe spinal stenosis, multiple falls Patient lives alone, has impaired mobility, uses a motorized scooter. She follows up with nephrologist at [facility]  Patient has been hospitalized twice in last 1 month. 5/17-5/23, she was admitted to [facility] service after a fall, noted to have acute worsening of creatinine.  Nephrology was involved.  Creatinine stabilized after diuretic dose adjustment and patient was discharged home.  Patient was on multiple mood altering medications which were reduced at discharge. 5/25-5/28, she was admitted to family medicine service with bilateral hip pain, volume overload, altered mental status.  She has diuresed and discharged home with home health with a creatinine of 4.58.  Today 6/5, patient was brought to the ED from home with unwitnessed fall.  Patient home health nurse found her in the floor, she was unable to tell how long she was on the floor for.    In the ED, patient was afebrile, heart rate in 60s, blood pressure in 140s, breathing on room air Labs with WBC count 5, hemoglobin 8.6, creatinine 5.44 compared to 4.58 at discharge last week. CT head and CT cervical spine did not show any acute injury Chest x-ray showed cardiomegaly with mild pulm edema Hospitalist service was consulted for inpatient admission and management.  At the time of my evaluation, patient was lying down in bed.  Not in distress.  Complains of frequent falls.  She  is not sure about dialysis but would like to talk to nephrologist.  Review of Systems:  All systems were reviewed and were negative unless otherwise mentioned in the HPI   Past medical history: Past Medical History:  Diagnosis Date   Abdominal pain    Acute encephalopathy 11/21/2017   Acute exacerbation of CHF (congestive heart failure) (HCC) 03/03/2022   Acute GI bleeding 03/29/2021   Acute kidney injury superimposed on chronic kidney disease (HCC) 02/18/2020   Acute metabolic encephalopathy 02/20/2020   Acute on chronic diastolic heart failure (HCC) 02/04/2022   Acute on chronic renal failure (HCC) 06/02/2022   Aggressive behavior    Agitation 11/22/2017   Anoxic brain injury (HCC) 09/08/2016   C. Arrest due to respiratory failure and COPD exacerbation   Anxiety    Arthritis    "all over" (04/10/2016)   Asthma 10/18/2010   Binge eating disorder    Blood loss anemia 04/24/2011   CAP (community acquired pneumonia) 06/22/2015   Cardiac arrest (HCC) 09/08/2016   PEA   Carotid artery stenosis    1-39% bilateral by dopplers 11/2016   Chronic diastolic (congestive) heart failure (HCC)    Chronic kidney disease, stage 3 (HCC)    Chronic kidney disease, stage 3b (HCC) 06/06/2021   Chronic pain syndrome 06/18/2012   Chronic post-traumatic stress disorder (PTSD) 05/27/2018   Chronic respiratory failure with hypoxia and hypercapnia (HCC) 06/22/2015   TRILOGY Vent >AVAPA-ES., Vt target 200-400, Max P 30 , PS max 20 , PS min 6-10 , E Max 6, E Min 4, Rate Auto AVAPS Rate 2 (titrate for pt comfort) , bleed O2 at 5l/m continuous  flow .    CKD (chronic kidney disease) 02/03/2022   Closed displaced fracture of fifth metacarpal bone 03/21/2018   Cocaine use disorder, severe, in sustained remission (HCC) 12/17/2015   Complication of anesthesia    decreased bp, decreased heart rate   COPD (chronic obstructive pulmonary disease) (HCC) 07/08/2014   Delusional disorder, persecutory type (HCC)  06/26/2021   Depression    Diabetic neuropathy (HCC) 04/24/2011   Difficulty with speech 01/24/2018   Disorder of nervous system    Drug abuse (HCC) 11/21/2017   Dyslipidemia 04/24/2011   E. coli UTI 02/20/2020   Elevated troponin 04/28/2012   Emphysema    Encephalopathy 11/21/2017   Essential hypertension 03/22/2016   Facet arthropathy, lumbar 06/12/2022   Fibula fracture 07/10/2016   Frequent falls 10/11/2017   GERD (gastroesophageal reflux disease)    GI bleed 03/30/2021   Gout 04/11/2017   Heart attack (HCC) 1980s   Hematochezia    History of blood transfusion 1994   "couldn't stop bleeding from my period"   History of drug abuse in remission (HCC) 11/28/2015   Quit in 2017   Hyperlipidemia 03/31/2021   Hyperlipidemia LDL goal <70    Hypocalcemia    Hypokalemia    Hypomagnesemia    Incontinence    Manic depression (HCC)    Morbid obesity (HCC) 10/18/2010   Obstructive sleep apnea 10/18/2010   On home oxygen therapy    "6L; 24/7" (04/10/2016)   OSA on CPAP    "wear mask sometimes" (04/10/2016)   Painless rectal bleeding 12/31/2015   Paranoid (HCC)    "sometimes; I'm on RX for it" (04/10/2016)   Periumbilical abdominal pain    Prolonged Q-T interval on ECG    Rectal bleeding 12/31/2015   Recurrent syncope 07/09/2021   Rhabdomyolysis 06/16/2021   Schizoaffective disorder, bipolar type (HCC) 04/05/2018   Seasonal allergies    Seborrheic keratoses 12/31/2013   Seizures (HCC)    "don't know what kind; last one was ~ 1 yr ago" (04/10/2016)   Sinus bradycardia    Skin ulcer of sacrum, limited to breakdown of skin (HCC) 03/08/2022   Spondylolisthesis at L3-L4 level 06/12/2022   Stroke (HCC) 1980s   denies residual on 04/10/2016   Thrush 09/19/2013   Type 2 diabetes mellitus (HCC) 10/18/2010    Past surgical history: Past Surgical History:  Procedure Laterality Date   CESAREAN SECTION  1997   COLONOSCOPY WITH PROPOFOL N/A 04/01/2021   Procedure: COLONOSCOPY WITH  PROPOFOL;  Surgeon: Jeani Hawking, MD;  Location: [facility] ENDOSCOPY;  Service: Gastroenterology;  Laterality: N/A;  Rectal bleeding with drop in hemoglobin to 7.2 g/dL   HERNIA REPAIR     IR CHOLANGIOGRAM EXISTING TUBE  07/20/2016   IR PERC CHOLECYSTOSTOMY  05/10/2016   IR RADIOLOGIST EVAL & MGMT  06/08/2016   IR RADIOLOGIST EVAL & MGMT  06/29/2016   IR SINUS/FIST TUBE CHK-NON GI  07/12/2016   RIGHT/LEFT HEART CATH AND CORONARY ANGIOGRAPHY N/A 06/19/2017   Procedure: RIGHT/LEFT HEART CATH AND CORONARY ANGIOGRAPHY;  Surgeon: Dolores Patty, MD;  Location: [facility] INVASIVE CV LAB;  Service: Cardiovascular;  Laterality: N/A;   TIBIA IM NAIL INSERTION Right 07/12/2016   Procedure: INTRAMEDULLARY (IM) NAIL RIGHT TIBIA;  Surgeon: Tarry Kos, MD;  Location: [facility] OR;  Service: Orthopedics;  Laterality: Right;   UMBILICAL HERNIA REPAIR  ~ 1963   "that's why I don't have a belly button"   VAGINAL HYSTERECTOMY      Social History:  reports that  she quit smoking about 6 years ago. Her smoking use included cigarettes. She started smoking about 45 years ago. She has a 57.00 pack-year smoking history. She has been exposed to tobacco smoke. She has never used smokeless tobacco. She reports that she does not currently use drugs after having used the following drugs: Cocaine. She reports that she does not drink alcohol.  Allergies:  Allergies  Allergen Reactions   Asa [Aspirin] Other (See Comments)    GI bleeds   Hydrocodone Shortness Of Breath   Latuda [Lurasidone Hcl] Anaphylaxis   Magnesium-Containing Compounds Anaphylaxis and Other (See Comments)    Tolerated Ensure   Prednisone Anaphylaxis and Swelling    Swelling of tongue, lips, throat   Ultram [Tramadol] Anaphylaxis, Swelling and Rash   Codeine Nausea And Vomiting and Rash   Desyrel [Trazodone] Other (See Comments)    Paranoia    Peanut (Diagnostic) Hives    Raw peanuts only  OK to eat peanut butter.   Topamax [Topiramate] Other (See Comments)     Paranoia   Sulfa Antibiotics Itching   Tape Rash   Zetia [Ezetimibe] Itching and Rash   Asa [aspirin], Hydrocodone, Latuda [lurasidone hcl], Magnesium-containing compounds, Prednisone, Ultram [tramadol], Codeine, Desyrel [trazodone], Peanut (diagnostic), Topamax [topiramate], Sulfa antibiotics, Tape, and Zetia [ezetimibe]   Family history:  Family History  Problem Relation Age of Onset   Cancer Mother        lung   Depression Mother    Cancer Father        prostate   Depression Sister    Anxiety disorder Sister    Schizophrenia Sister    Bipolar disorder Sister    Depression Sister    Depression Brother    Heart failure Other        cousin     Home Meds: Prior to Admission medications   Medication Sig Start Date End Date Taking? Authorizing Provider  albuterol (VENTOLIN HFA) 108 (90 Base) MCG/ACT inhaler Inhale 2 puffs into the lungs every 6 (six) hours as needed for wheezing or shortness of breath. 01/05/22   Tiffany Kocher, DO  amLODipine (NORVASC) 10 MG tablet Take 10 mg by mouth daily.    [provider]  atorvastatin (LIPITOR) 20 MG tablet Take 1 tablet (20 mg total) by mouth daily. 04/03/22   Tiffany Kocher, DO  carvedilol (COREG) 12.5 MG tablet Take 1 tablet (12.5 mg total) by mouth 2 (two) times daily. 03/07/22   Pricilla Riffle, MD  cyanocobalamin 1000 MCG tablet Take 1 tablet (1,000 mcg total) by mouth daily. Patient not taking: Reported on 06/10/2022 06/09/22 09/07/22  Lorin Glass, MD  dicyclomine (BENTYL) 10 MG capsule Take 10 mg by mouth 3 (three) times daily.    [provider]  escitalopram (LEXAPRO) 10 MG tablet Take 1 tablet (10 mg total) by mouth daily. 06/13/22   Dameron, Nolberto Hanlon, DO  escitalopram (LEXAPRO) 20 MG tablet TAKE 1 TABLET(20 MG) BY MOUTH DAILY 06/21/22   Tiffany Kocher, DO  ferrous sulfate 325 (65 FE) MG tablet Take 1 tablet (325 mg total) by mouth every other day. 06/13/22 09/11/22  Dameron, Nolberto Hanlon, DO  Fluticasone-Umeclidin-Vilant  (TRELEGY ELLIPTA) 100-62.5-25 MCG/ACT AEPB INHALE 1 PUFF INTO THE LUNGS DAILY 03/20/22   Tiffany Kocher, DO  folic acid (FOLVITE) 1 MG tablet Take 1 tablet (1 mg total) by mouth daily. 06/09/22 09/07/22  Lorin Glass, MD  lamoTRIgine (LAMICTAL) 100 MG tablet Take 100 mg by mouth daily.    [provider]  lamoTRIgine (LAMICTAL) 25 MG tablet TAKE 1 TABLET(25 MG) BY MOUTH TWICE DAILY 06/16/22   Tiffany Kocher, DO  loperamide (IMODIUM) 2 MG capsule Take 2 mg by mouth 3 (three) times daily as needed for diarrhea or loose stools.    [provider]  Multiple Vitamins-Minerals (CENTRUM SILVER 50+WOMEN) TABS Take 1 tablet by mouth daily.    [provider]  OLANZapine (ZYPREXA) 10 MG tablet Take 10 mg by mouth at bedtime.    [provider]  omeprazole (PRILOSEC) 40 MG capsule Take 1 capsule (40 mg total) by mouth every morning. 04/03/22   Tiffany Kocher, DO  prazosin (MINIPRESS) 1 MG capsule Take 2 capsules (2 mg total) by mouth at bedtime. 06/13/22   Dameron, Nolberto Hanlon, DO  pregabalin (LYRICA) 75 MG capsule Take 1 capsule (75 mg total) by mouth daily. 06/13/22 07/13/22  Dameron, Nolberto Hanlon, DO  torsemide (DEMADEX) 20 MG tablet Take 1 tablet (20 mg total) by mouth 2 (two) times daily. 06/13/22   Darral Dash, DO    Physical Exam: Vitals:   06/21/22 1845 06/21/22 1930 06/21/22 1945 06/21/22 1957  BP: (!) 135/96   (!) 146/98  Pulse: 63 68 69 67  Resp: 20 14  18   Temp:    (!) 97.5 F (36.4 C)  TempSrc:    Oral  SpO2: 98% 93% 100% 97%  Weight:      Height:       Wt Readings from Last 3 Encounters:  06/21/22 93.8 kg  06/13/22 93.8 kg  06/09/22 97.8 kg   Body mass index is 35.5 kg/m.  General exam: Pleasant, middle-aged African-American female.  Looks older for her age Skin: No rashes, lesions or ulcers. HEENT: Atraumatic, normocephalic, no obvious bleeding Lungs: Clear to auscultation bilaterally CVS: Regular rate and rhythm, no murmur GI/Abd soft, nontender,  nondistended, bowel sound present CNS: Alert, awake, slow to respond but oriented x 3 Psychiatry: Mood appropriate Extremities: Bilateral lower extremity edema 1+, no calf tenderness   ------------------------------------------------------------------------------------------------------ Assessment/Plan: Active Problems:   Hypersomnia, Chronic   Physical deconditioning   Anemia of renal disease   AKI (acute kidney injury) (HCC)  AKI on CKD 5 Volume overload status Patient's overall renal function has been worsening steadily over the last 6 months.  Her creatinine was in the range of 1.2-1.8 in September 2023, less than 3 in February 2024 and ws consistently over 4 in her last 2 hospitalizations in 1 month.  She chronically uses diuretics for lower extremity edema Last seen by nephrologist Dr. Juel Burrow 5/21 in the hospital.  Renal biopsy was not considered at that time because patient interested in palliative approach only and did not want to go to dialysis Presented today with a creatinine of 5.44. Also has worsening normal bilateral lower extremity edema and chest x-ray with cardiomegaly and pulmonary vessel congestion. Breathing on room air however.  I would avoid diuretics at this time. considering her potential progression to dialysis in need of nephrology evaluation, I will admit her to [name].  Consider nephrology consultation in the morning. PTA also on Coreg, amlodipine, prazosin Continue dose.  Keep diuretics on hold Recent Labs    06/04/22 0206 06/05/22 0153 06/06/22 0757 06/07/22 0823 06/08/22 0154 06/10/22 1223 06/11/22 0225 06/12/22 0219 06/13/22 0718 06/21/22 1636  BUN 54* 54* 54* 63* 66* 75* 75* 81* 81* 107*  CREATININE 4.55* 4.13* 4.17* 4.33* 4.60* 4.78* 4.92* 4.74* 4.58* 5.44*   Chronic hypersomnia Polypharmacy Schizoaffective disorder Anxiety/depression Cocaine abuse Patient has been chronically on  mood altering medications including Cymbalta, Lexapro,  Lamictal, Zyprexa, Lyrica, Flexeril, Saphris. During her last hospitalization, I had discontinued Cymbalta and Saphris.  Patient was reluctant to make any other change.  Lyrica dose was reduced because of AKI but patient however continued to take than prescribed.  Also continues to use cocaine.  UDS positive on 5/25.  Repeat UDS today. Continue Lamictal to avoid seizure.  Hold orders.  Severe lumbar spine stenosis Peripheral neuropathy Chronic L3-L4 osteomyelitis Impaired mobility Wheelchair bound status In the past seen by Dr. Franky Macho, not a good candidate for surgery and patient did not want surgery anyway. Lives at home.  Gets frequently admitted for falls. Obtain PT eval  Chronic anemia Hemoglobin chronically low but stable between 8 and 9.  Continue vitamin for supplement Recent Labs    06/04/22 0206 06/05/22 0153 06/11/22 0225 06/11/22 1054 06/12/22 0219 06/13/22 0718 06/21/22 1636  HGB 8.0*   < > 7.6* 8.5* 7.7* 8.6* 8.6*  MCV 90.7   < > 91.2  --  89.0 92.2 92.0  VITAMINB12 301  --   --   --   --   --   --   FOLATE 4.1*  --   --   --   --   --   --   FERRITIN 30  --   --   --   --   --   --   TIBC 287  --   --   --   --   --   --   IRON 61  --   --   --   --   --   --   RETICCTPCT 1.7  --   --   --   --   --   --    < > = values in this interval not displayed.   CAD, history of cardiac arrest HLD Continue Coreg and Lipitor. Not on antiplatelets/anticoagulant  DM2 A1c 5 in 2023. Not on antidiabetic medicines   Goals of care   Code Status: Full Code    DVT prophylaxis: Heparin subcu    Antimicrobials: None Fluid: None Consultants: Consider nephrology consult in the morning Family Communication: None at bedside  Dispo: The patient is from: Home              Anticipated d/c is to: Pending clinical course  Diet: Diet Order             Diet Heart Room service appropriate? Yes; Fluid consistency: Thin  Diet effective now                     ------------------------------------------------------------------------------------- Severity of Illness: The appropriate patient status for this patient is INPATIENT. Inpatient status is judged to be reasonable and necessary in order to provide the required intensity of service to ensure the patient's safety. The patient's presenting symptoms, physical exam findings, and initial radiographic and laboratory data in the context of their chronic comorbidities is felt to place them at high risk for further clinical deterioration. Furthermore, it is not anticipated that the patient will be medically stable for discharge from the hospital within 2 midnights of admission.   * I certify that at the point of admission it is my clinical judgment that the patient will require inpatient hospital care spanning beyond 2 midnights from the point of admission due to high intensity of service, high risk for further deterioration and high frequency of surveillance required.* -------------------------------------------------------------------------------------  Labs on Admission:  CBC: Recent Labs  Lab 06/21/22 1636  WBC 5.0  HGB 8.6*  HCT 28.8*  MCV 92.0  PLT 236    Basic Metabolic Panel: Recent Labs  Lab 06/21/22 1636  NA 137  K 4.9  CL 105  CO2 21*  GLUCOSE 113*  BUN 107*  CREATININE 5.44*  CALCIUM 8.9    Liver Function Tests: Recent Labs  Lab 06/21/22 1636  AST 23  ALT 12  ALKPHOS 138*  BILITOT 0.4  PROT 7.2  ALBUMIN 3.8   No results for input(s): "LIPASE", "AMYLASE" in the last 168 hours. No results for input(s): "AMMONIA" in the last 168 hours.  Cardiac Enzymes: Recent Labs  Lab 06/21/22 1636  CKTOTAL 623*    BNP (last 3 results) Recent Labs    06/02/22 1653 06/03/22 0220 06/10/22 1217  BNP 363.6* 303.1* 256.0*    ProBNP (last 3 results) Recent Labs    05/05/22 1315 05/19/22 1408  PROBNP 1,007* 2,269*    CBG: No results for input(s): "GLUCAP" in the last  168 hours.  Lipase     Component Value Date/Time   LIPASE 27 09/02/2021 1942     Urinalysis    Component Value Date/Time   COLORURINE COLORLESS (A) 06/10/2022 2136   APPEARANCEUR CLEAR 06/10/2022 2136   LABSPEC 1.006 06/10/2022 2136   PHURINE 6.0 06/10/2022 2136   GLUCOSEU NEGATIVE 06/10/2022 2136   HGBUR SMALL (A) 06/10/2022 2136   BILIRUBINUR NEGATIVE 06/10/2022 2136   BILIRUBINUR NEG 05/21/2014 1155   KETONESUR NEGATIVE 06/10/2022 2136   PROTEINUR 100 (A) 06/10/2022 2136   UROBILINOGEN 0.2 09/30/2019 1557   NITRITE NEGATIVE 06/10/2022 2136   LEUKOCYTESUR NEGATIVE 06/10/2022 2136     Drugs of Abuse     Component Value Date/Time   LABOPIA NONE DETECTED 06/10/2022 2136   COCAINSCRNUR POSITIVE (A) 06/10/2022 2136   COCAINSCRNUR NONE DETECTED 09/19/2021 1910   COCAINSCRNUR NEG 06/17/2012 1713   LABBENZ NONE DETECTED 06/10/2022 2136   LABBENZ NEG 06/17/2012 1713   AMPHETMU NONE DETECTED 06/10/2022 2136   THCU NONE DETECTED 06/10/2022 2136   LABBARB NONE DETECTED 06/10/2022 2136      Radiological Exams on Admission: DG Chest 2 View  Result Date: 06/21/2022 CLINICAL DATA:  fall, cp EXAM: CHEST - 2 VIEW COMPARISON:  CXR 06/09/22 FINDINGS: Cardiomegaly. No pleural effusion. No pneumothorax. Findings of mild pulmonary edema. Chronic left-sided rib fractures left seventh and eighth ribs. No radiographically apparent new displaced rib fracture. Visualized upper abdomen is unremarkable. Vertebral body heights are maintained. IMPRESSION: Cardiomegaly with mild pulmonary edema. Electronically Signed   By: Lorenza Cambridge M.D.   On: 06/21/2022 18:04   CT HEAD WO CONTRAST ( )  Result Date: 06/21/2022 CLINICAL DATA:  Head trauma, moderate-severe fall, pt poor historian uncertain of head injury and some baseline AMS present; Neck trauma, midline tenderness (Age 37-64y) EXAM: CT HEAD WITHOUT CONTRAST CT CERVICAL SPINE WITHOUT CONTRAST TECHNIQUE: Multidetector CT imaging of the head and  cervical spine was performed following the standard protocol without intravenous contrast. Multiplanar CT image reconstructions of the cervical spine were also generated. RADIATION DOSE REDUCTION: This exam was performed according to the departmental dose-optimization program which includes automated exposure control, adjustment of the mA and/or kV according to patient size and/or use of iterative reconstruction technique. COMPARISON:  CT Head 07/09/21 FINDINGS: CT HEAD FINDINGS Brain: No evidence of acute infarction, hemorrhage, hydrocephalus, extra-axial collection or mass lesion/mass effect. Sequela of mild chronic microvascular ischemic change. Vascular: No hyperdense vessel or unexpected calcification.  Skull: Normal. Negative for fracture or focal lesion. Sinuses/Orbits: Small bilateral mastoid effusions. No middle ear effusion. Paranasal sinuses are clear. Unremarkable. Other: None. CT CERVICAL SPINE FINDINGS Alignment: There is straightening of the normal cervical lordosis. Skull base and vertebrae: No acute fracture. No primary bone lesion or focal pathologic process. Soft tissues and spinal canal: No prevertebral fluid or swelling. No visible canal hematoma. Disc levels:  No evidence of high-grade canal stenosis. Upper chest: Negative. Other: None IMPRESSION: 1. No acute intracranial abnormality. 2. No acute fracture or traumatic subluxation of the cervical spine. 3. Small bilateral mastoid effusions. Electronically Signed   By: Lorenza Cambridge M.D.   On: 06/21/2022 18:01   CT Cervical Spine Wo Contrast  Result Date: 06/21/2022 CLINICAL DATA:  Head trauma, moderate-severe fall, pt poor historian uncertain of head injury and some baseline AMS present; Neck trauma, midline tenderness (Age 52-64y) EXAM: CT HEAD WITHOUT CONTRAST CT CERVICAL SPINE WITHOUT CONTRAST TECHNIQUE: Multidetector CT imaging of the head and cervical spine was performed following the standard protocol without intravenous contrast.  Multiplanar CT image reconstructions of the cervical spine were also generated. RADIATION DOSE REDUCTION: This exam was performed according to the departmental dose-optimization program which includes automated exposure control, adjustment of the mA and/or kV according to patient size and/or use of iterative reconstruction technique. COMPARISON:  CT Head 07/09/21 FINDINGS: CT HEAD FINDINGS Brain: No evidence of acute infarction, hemorrhage, hydrocephalus, extra-axial collection or mass lesion/mass effect. Sequela of mild chronic microvascular ischemic change. Vascular: No hyperdense vessel or unexpected calcification. Skull: Normal. Negative for fracture or focal lesion. Sinuses/Orbits: Small bilateral mastoid effusions. No middle ear effusion. Paranasal sinuses are clear. Unremarkable. Other: None. CT CERVICAL SPINE FINDINGS Alignment: There is straightening of the normal cervical lordosis. Skull base and vertebrae: No acute fracture. No primary bone lesion or focal pathologic process. Soft tissues and spinal canal: No prevertebral fluid or swelling. No visible canal hematoma. Disc levels:  No evidence of high-grade canal stenosis. Upper chest: Negative. Other: None IMPRESSION: 1. No acute intracranial abnormality. 2. No acute fracture or traumatic subluxation of the cervical spine. 3. Small bilateral mastoid effusions. Electronically Signed   By: Lorenza Cambridge M.D.   On: 06/21/2022 18:01     Signed, Lorin Glass, MD [facility] 06/21/2022

## 2022-06-21 NOTE — ED Provider Notes (Signed)
  Procedures  Ultrasound ED Peripheral IV (Provider)  Date/Time: 06/21/2022 7:08 PM  Performed by: Mannie Stabile, PA-C Authorized by: Mannie Stabile, PA-C   Procedure details:    Indications: hydration     Skin Prep: chlorhexidine gluconate     Location:  Right AC   Angiocath:  20 G   Bedside Ultrasound Guided: No     Images: archived     Patient tolerated procedure without complications: Yes     Dressing applied: Yes           Mannie Stabile, PA-C 06/21/22 1909    Rozelle Logan, DO 06/21/22 2353

## 2022-06-21 NOTE — ED Provider Notes (Signed)
[place] EMERGENCY DEPARTMENT AT [facility] Provider Note   CSN: [id number] Arrival date & time: 06/21/22  1547     History  Chief Complaint  Patient presents with   Randol Kern ELIDETH RAMLALL is a 62 y.o. female.   Fall  Patient is a 62 year old female CKD and obesity, OSA, DM2, HTN, HLD, CAD  [name] present emergency room today with complaints of fall.  She has been hospitalized multiple times for AKI's  She tells me that she had a fall.  It seems that this was an unwitnessed fall.  [name] not certain of how long she was on the ground for but does not seem to have been a long time.  She states she has some bilateral leg pain but this is not new for this patient.  She denies any chest pain or difficulty breathing.  [name] uncertain if she hit her head.  No nausea or vomiting     Home Medications Prior to Admission medications   Medication Sig Start Date End Date Taking? Authorizing Provider  albuterol (VENTOLIN HFA) 108 (90 Base) MCG/ACT inhaler Inhale 2 puffs into the lungs every 6 (six) hours as needed for wheezing or shortness of breath. 01/05/22   Tiffany Kocher, DO  amLODipine (NORVASC) 10 MG tablet Take 10 mg by mouth daily.    [provider]  atorvastatin (LIPITOR) 20 MG tablet Take 1 tablet (20 mg total) by mouth daily. 04/03/22   Tiffany Kocher, DO  carvedilol (COREG) 12.5 MG tablet Take 1 tablet (12.5 mg total) by mouth 2 (two) times daily. 03/07/22   Pricilla Riffle, MD  cyanocobalamin 1000 MCG tablet Take 1 tablet (1,000 mcg total) by mouth daily. Patient not taking: Reported on 06/10/2022 06/09/22 09/07/22  Lorin Glass, MD  dicyclomine (BENTYL) 10 MG capsule Take 10 mg by mouth 3 (three) times daily.    [provider]  escitalopram (LEXAPRO) 10 MG tablet Take 1 tablet (10 mg total) by mouth daily. 06/13/22   Dameron, Nolberto Hanlon, DO  escitalopram (LEXAPRO) 20 MG tablet TAKE 1 TABLET(20 MG) BY MOUTH DAILY 06/21/22   Tiffany Kocher, DO   ferrous sulfate 325 (65 FE) MG tablet Take 1 tablet (325 mg total) by mouth every other day. 06/13/22 09/11/22  Dameron, Nolberto Hanlon, DO  Fluticasone-Umeclidin-Vilant (TRELEGY ELLIPTA) 100-62.5-25 MCG/ACT AEPB INHALE 1 PUFF INTO THE LUNGS DAILY 03/20/22   Tiffany Kocher, DO  folic acid (FOLVITE) 1 MG tablet Take 1 tablet (1 mg total) by mouth daily. 06/09/22 09/07/22  Lorin Glass, MD  lamoTRIgine (LAMICTAL) 100 MG tablet Take 100 mg by mouth daily.    [provider]  lamoTRIgine (LAMICTAL) 25 MG tablet TAKE 1 TABLET(25 MG) BY MOUTH TWICE DAILY 06/16/22   Tiffany Kocher, DO  loperamide (IMODIUM) 2 MG capsule Take 2 mg by mouth 3 (three) times daily as needed for diarrhea or loose stools.    [provider]  Multiple Vitamins-Minerals (CENTRUM SILVER 50+WOMEN) TABS Take 1 tablet by mouth daily.    [provider]  OLANZapine (ZYPREXA) 10 MG tablet Take 10 mg by mouth at bedtime.    [provider]  omeprazole (PRILOSEC) 40 MG capsule Take 1 capsule (40 mg total) by mouth every morning. 04/03/22   Tiffany Kocher, DO  prazosin (MINIPRESS) 1 MG capsule Take 2 capsules (2 mg total) by mouth at bedtime. 06/13/22   Dameron, Nolberto Hanlon, DO  pregabalin (LYRICA) 75 MG capsule Take 1 capsule (75 mg total) by  mouth daily. 06/13/22 07/13/22  Dameron, Nolberto Hanlon, DO  torsemide (DEMADEX) 20 MG tablet Take 1 tablet (20 mg total) by mouth 2 (two) times daily. 06/13/22   Dameron, Nolberto Hanlon, DO      Allergies    Asa [aspirin], Hydrocodone, Latuda [lurasidone hcl], Magnesium-containing compounds, Prednisone, Ultram [tramadol], Codeine, Desyrel [trazodone], Peanut (diagnostic), Topamax [topiramate], Sulfa antibiotics, Tape, and Zetia [ezetimibe]    Review of Systems   Review of Systems  Physical Exam Updated Vital Signs BP (!) 135/96   Pulse 63   Temp (!) 97.5 F (36.4 C) (Oral)   Resp 20   Ht 5\' 4"  (1.626 m)   Wt 93.8 kg   SpO2 98%   BMI 35.50 kg/m  Physical Exam Vitals and nursing note  reviewed.  Constitutional:      General: [name] not in acute distress. HENT:     Head: Normocephalic and atraumatic.     Nose: Nose normal.     Mouth/Throat:     Mouth: Mucous membranes are dry.  Eyes:     General: No scleral icterus. Cardiovascular:     Rate and Rhythm: Normal rate and regular rhythm.     Pulses: Normal pulses.     Heart sounds: Normal heart sounds.  Pulmonary:     Effort: Pulmonary effort is normal. No respiratory distress.     Breath sounds: No wheezing.  Abdominal:     Palpations: Abdomen is soft.     Tenderness: There is no abdominal tenderness. There is no guarding or rebound.  Musculoskeletal:     Cervical back: Normal range of motion.     Right lower leg: Edema present.     Left lower leg: Edema present.     Comments: Symmetric bilateral 2+ pitting edema to lower extremities  Skin:    General: Skin is warm and dry.     Capillary Refill: Capillary refill takes less than 2 seconds.  Neurological:     Mental Status: [name] alert. Mental status is at baseline.  Psychiatric:        Mood and Affect: Mood normal.        Behavior: Behavior normal.     ED Results / Procedures / Treatments   Labs (all labs ordered are listed, but only abnormal results are displayed) Labs Reviewed  BASIC METABOLIC PANEL - Abnormal; Notable for the following components:      Result Value   CO2 21 (*)    Glucose, Bld 113 (*)    BUN 107 (*)    Creatinine, Ser 5.44 (*)    GFR, Estimated 8 (*)    All other components within normal limits  CBC - Abnormal; Notable for the following components:   RBC 3.13 (*)    Hemoglobin 8.6 (*)    HCT 28.8 (*)    MCHC 29.9 (*)    RDW 18.0 (*)    All other components within normal limits  CK - Abnormal; Notable for the following components:   Total CK 623 (*)    All other components within normal limits  HEPATIC FUNCTION PANEL - Abnormal; Notable for the following components:   Alkaline Phosphatase 138 (*)    Indirect Bilirubin 0.2  (*)    All other components within normal limits  ETHANOL  RAPID URINE DRUG SCREEN, HOSP PERFORMED    EKG None  Radiology DG Chest 2 View  Result Date: 06/21/2022 CLINICAL DATA:  fall, cp EXAM: CHEST - 2 VIEW COMPARISON:  CXR 06/09/22 FINDINGS: Cardiomegaly.  No pleural effusion. No pneumothorax. Findings of mild pulmonary edema. Chronic left-sided rib fractures left seventh and eighth ribs. No radiographically apparent new displaced rib fracture. Visualized upper abdomen is unremarkable. Vertebral body heights are maintained. IMPRESSION: Cardiomegaly with mild pulmonary edema. Electronically Signed   By: Lorenza Cambridge M.D.   On: 06/21/2022 18:04   CT HEAD WO CONTRAST ( )  Result Date: 06/21/2022 CLINICAL DATA:  Head trauma, moderate-severe fall, pt poor historian uncertain of head injury and some baseline AMS present; Neck trauma, midline tenderness (Age 39-64y) EXAM: CT HEAD WITHOUT CONTRAST CT CERVICAL SPINE WITHOUT CONTRAST TECHNIQUE: Multidetector CT imaging of the head and cervical spine was performed following the standard protocol without intravenous contrast. Multiplanar CT image reconstructions of the cervical spine were also generated. RADIATION DOSE REDUCTION: This exam was performed according to the departmental dose-optimization program which includes automated exposure control, adjustment of the mA and/or kV according to patient size and/or use of iterative reconstruction technique. COMPARISON:  CT Head 07/09/21 FINDINGS: CT HEAD FINDINGS Brain: No evidence of acute infarction, hemorrhage, hydrocephalus, extra-axial collection or mass lesion/mass effect. Sequela of mild chronic microvascular ischemic change. Vascular: No hyperdense vessel or unexpected calcification. Skull: Normal. Negative for fracture or focal lesion. Sinuses/Orbits: Small bilateral mastoid effusions. No middle ear effusion. Paranasal sinuses are clear. Unremarkable. Other: None. CT CERVICAL SPINE FINDINGS Alignment:  There is straightening of the normal cervical lordosis. Skull base and vertebrae: No acute fracture. No primary bone lesion or focal pathologic process. Soft tissues and spinal canal: No prevertebral fluid or swelling. No visible canal hematoma. Disc levels:  No evidence of high-grade canal stenosis. Upper chest: Negative. Other: None IMPRESSION: 1. No acute intracranial abnormality. 2. No acute fracture or traumatic subluxation of the cervical spine. 3. Small bilateral mastoid effusions. Electronically Signed   By: Lorenza Cambridge M.D.   On: 06/21/2022 18:01   CT Cervical Spine Wo Contrast  Result Date: 06/21/2022 CLINICAL DATA:  Head trauma, moderate-severe fall, pt poor historian uncertain of head injury and some baseline AMS present; Neck trauma, midline tenderness (Age 65-64y) EXAM: CT HEAD WITHOUT CONTRAST CT CERVICAL SPINE WITHOUT CONTRAST TECHNIQUE: Multidetector CT imaging of the head and cervical spine was performed following the standard protocol without intravenous contrast. Multiplanar CT image reconstructions of the cervical spine were also generated. RADIATION DOSE REDUCTION: This exam was performed according to the departmental dose-optimization program which includes automated exposure control, adjustment of the mA and/or kV according to patient size and/or use of iterative reconstruction technique. COMPARISON:  CT Head 07/09/21 FINDINGS: CT HEAD FINDINGS Brain: No evidence of acute infarction, hemorrhage, hydrocephalus, extra-axial collection or mass lesion/mass effect. Sequela of mild chronic microvascular ischemic change. Vascular: No hyperdense vessel or unexpected calcification. Skull: Normal. Negative for fracture or focal lesion. Sinuses/Orbits: Small bilateral mastoid effusions. No middle ear effusion. Paranasal sinuses are clear. Unremarkable. Other: None. CT CERVICAL SPINE FINDINGS Alignment: There is straightening of the normal cervical lordosis. Skull base and vertebrae: No acute  fracture. No primary bone lesion or focal pathologic process. Soft tissues and spinal canal: No prevertebral fluid or swelling. No visible canal hematoma. Disc levels:  No evidence of high-grade canal stenosis. Upper chest: Negative. Other: None IMPRESSION: 1. No acute intracranial abnormality. 2. No acute fracture or traumatic subluxation of the cervical spine. 3. Small bilateral mastoid effusions. Electronically Signed   By: Lorenza Cambridge M.D.   On: 06/21/2022 18:01    Procedures Procedures    Medications Ordered in ED Medications  0.9 %  sodium chloride infusion (has no administration in time range)    ED Course/ Medical Decision Making/ A&P Clinical Course as of 06/21/22 1952  Wed Jun 21, 2022  1936 3 months ago 2.7 creat [WF]    Clinical Course User Index [WF] Gailen Shelter, [place]                             Medical Decision Making Amount and/or Complexity of Data Reviewed Labs: ordered. Radiology: ordered.  Risk Prescription drug management. Decision regarding hospitalization.   This patient presents to the ED for concern of fatigue, this involves a number of treatment options, and is a complaint that carries with it a high risk of complications and morbidity. A differential diagnosis was considered for the patient's symptoms which is discussed below:   The differential diagnosis of weakness includes but is not limited to neurologic causes (GBS, myasthenia gravis, CVA, MS, ALS, transverse myelitis, spinal cord injury, CVA, botulism, ) and other causes: ACS, Arrhythmia, syncope, orthostatic hypotension, sepsis, hypoglycemia, electrolyte disturbance, hypothyroidism, respiratory failure, symptomatic anemia, dehydration, heat injury, polypharmacy, malignancy.    Co morbidities: Discussed in HPI   Brief History:  Patient is a 62 year old female CKD and obesity, OSA, DM2, HTN, HLD, CAD  [name] present emergency room today with complaints of fall.  She has been  hospitalized multiple times for AKI's  She tells me that she had a fall.  It seems that this was an unwitnessed fall.  [name] not certain of how long she was on the ground for but does not seem to have been a long time.  She states she has some bilateral leg pain but this is not new for this patient.  She denies any chest pain or difficulty breathing.  [name] uncertain if she hit her head.  No nausea or vomiting    EMR reviewed including pt PMHx, past surgical history and past visits to ER.   See HPI for more details   Lab Tests:   I ordered and independently interpreted labs. Labs notable for Patient with AKI on CKD.  Labs otherwise at baseline  Imaging Studies:  NAD. I personally reviewed all imaging studies and no acute abnormality found. I agree with radiology interpretation. No acute abnormal findings on imaging   Cardiac Monitoring:  NA   Medicines ordered:  I ordered medication including gentle hydration at 75 mL/h IV normal saline for hydration Reevaluation of the patient after these medicines showed that the patient stayed the same I have reviewed the patients home medicines and have made adjustments as needed   Critical Interventions:     Consults/Attending Physician   I discussed this case with my attending physician who cosigned this note including patient's presenting symptoms, physical exam, and planned diagnostics and interventions. Attending physician stated agreement with plan or made changes to plan which were implemented.   Reevaluation:  After the interventions noted above I re-evaluated patient and found that they have :stayed the same   Social Determinants of Health:      Problem List / ED Course:  Patient has AKI on CKD admitted for monitoring and gentle hydration   Dispostion:  After consideration of the diagnostic results and the patients response to treatment, I feel that the patent would benefit from admission   Final  Clinical Impression(s) / ED Diagnoses Final diagnoses:  AKI (acute kidney injury) (HCC)    Rx / DC Orders ED Discharge  Orders     None         Gailen Shelter, [place] 06/21/22 2343    Cathren Laine, MD 06/22/22 0700

## 2022-06-21 NOTE — ED Triage Notes (Signed)
Patient BIB EMS from home c/o unwitnessed fall. Per report Home health nurse found patient in the floor this afternoon. Patient unable to tell when she fell. Pt denies LOC. Patient c/o bilateral leg pain. Patient denies N/V.  CBG 133 BP 140/70 HR 60 RR 16 O2sat 93% on RA

## 2022-06-22 ENCOUNTER — Ambulatory Visit: Payer: Self-pay | Admitting: Student

## 2022-06-22 DIAGNOSIS — N189 Chronic kidney disease, unspecified: Secondary | ICD-10-CM

## 2022-06-22 DIAGNOSIS — D631 Anemia in chronic kidney disease: Secondary | ICD-10-CM | POA: Diagnosis not present

## 2022-06-22 DIAGNOSIS — Z515 Encounter for palliative care: Secondary | ICD-10-CM

## 2022-06-22 DIAGNOSIS — Z7189 Other specified counseling: Secondary | ICD-10-CM

## 2022-06-22 DIAGNOSIS — N179 Acute kidney failure, unspecified: Secondary | ICD-10-CM | POA: Diagnosis not present

## 2022-06-22 LAB — CBC
HCT: 26.4 % — ABNORMAL LOW (ref 36.0–46.0)
Hemoglobin: 7.8 g/dL — ABNORMAL LOW (ref 12.0–15.0)
MCH: 27.3 pg (ref 26.0–34.0)
MCHC: 29.5 g/dL — ABNORMAL LOW (ref 30.0–36.0)
MCV: 92.3 fL (ref 80.0–100.0)
Platelets: 198 10*3/uL (ref 150–400)
RBC: 2.86 MIL/uL — ABNORMAL LOW (ref 3.87–5.11)
RDW: 17.9 % — ABNORMAL HIGH (ref 11.5–15.5)
WBC: 3.6 10*3/uL — ABNORMAL LOW (ref 4.0–10.5)
nRBC: 0 % (ref 0.0–0.2)

## 2022-06-22 LAB — BASIC METABOLIC PANEL
Anion gap: 10 (ref 5–15)
BUN: 108 mg/dL — ABNORMAL HIGH (ref 8–23)
CO2: 20 mmol/L — ABNORMAL LOW (ref 22–32)
Calcium: 8.4 mg/dL — ABNORMAL LOW (ref 8.9–10.3)
Chloride: 109 mmol/L (ref 98–111)
Creatinine, Ser: 5.48 mg/dL — ABNORMAL HIGH (ref 0.44–1.00)
GFR, Estimated: 8 mL/min — ABNORMAL LOW (ref >60)
Glucose, Bld: 90 mg/dL (ref 70–99)
Potassium: 4.1 mmol/L (ref 3.5–5.1)
Sodium: 139 mmol/L (ref 135–145)

## 2022-06-22 MED ORDER — FUROSEMIDE 10 MG/ML IJ SOLN: 80.0000 mg | Freq: Two times a day (BID) | INTRAMUSCULAR | Status: DC

## 2022-06-22 MED ORDER — PANTOPRAZOLE SODIUM 40 MG PO TBEC
40.0000 mg | DELAYED_RELEASE_TABLET | Freq: Every day | ORAL | Status: DC
  Administered 2022-06-22 – 2022-06-24 (×3): 40 mg via ORAL
  Filled 2022-06-22 (×3): qty 1

## 2022-06-22 MED ORDER — OXYCODONE HCL 5 MG PO TABS
5.0000 mg | ORAL_TABLET | ORAL | Status: AC
  Administered 2022-06-22: 5 mg via ORAL
  Filled 2022-06-22: qty 1

## 2022-06-22 MED ORDER — OLANZAPINE 10 MG PO TABS
10.0000 mg | ORAL_TABLET | Freq: Every day | ORAL | Status: DC
  Administered 2022-06-22 – 2022-06-23 (×2): 10 mg via ORAL
  Filled 2022-06-22 (×3): qty 1

## 2022-06-22 MED ORDER — FUROSEMIDE 10 MG/ML IJ SOLN
80.0000 mg | Freq: Three times a day (TID) | INTRAMUSCULAR | Status: DC
  Administered 2022-06-22 – 2022-06-24 (×8): 80 mg via INTRAVENOUS
  Filled 2022-06-22 (×8): qty 8

## 2022-06-22 MED ORDER — ALBUTEROL SULFATE HFA 108 (90 BASE) MCG/ACT IN AERS: 2.0000 | INHALATION_SPRAY | Freq: Four times a day (QID) | RESPIRATORY_TRACT | Status: DC | PRN

## 2022-06-22 MED ORDER — ASENAPINE MALEATE 5 MG SL SUBL
5.0000 mg | SUBLINGUAL_TABLET | Freq: Two times a day (BID) | SUBLINGUAL | Status: DC
  Administered 2022-06-22 – 2022-06-24 (×5): 5 mg via SUBLINGUAL
  Filled 2022-06-22 (×6): qty 1

## 2022-06-22 MED ORDER — ESCITALOPRAM OXALATE 10 MG PO TABS
20.0000 mg | ORAL_TABLET | Freq: Every day | ORAL | Status: DC
  Administered 2022-06-22 – 2022-06-24 (×3): 20 mg via ORAL
  Filled 2022-06-22 (×3): qty 2

## 2022-06-22 MED ORDER — ALBUTEROL SULFATE (2.5 MG/3ML) 0.083% IN NEBU: 2.5000 mg | INHALATION_SOLUTION | Freq: Four times a day (QID) | RESPIRATORY_TRACT | Status: DC | PRN

## 2022-06-22 NOTE — Evaluation (Signed)
Physical Therapy Evaluation Patient Details Name: Tiffany [name] MRN: [id number] DOB: [birth year] Today's Date: 06/22/2022  History of Present Illness  63 y.o. female was brought to the ED 06/21/22 from home with unwitnessed fall. [name] not certain of how long she was on the ground for but does not seem to have been a long time. AKI on CKD;  PMH-obesity, OSA, HTN, CHF, COPD, DM2, CKD, PTSD, gout, seizures, CVA, anoxic brain injury, spinal stenosis, peripheral neuropathy, schizophrenia/schizoaffective disorder, CAD s/p MI x2, HLD, multiple falls, and drug abuse (cocaine).  Clinical Impression   Pt admitted secondary to problem above with deficits below. PTA patient was living alone in an apartment with steps to enter. She only leaves apartment with medical transport/EMS. She reports her landlord has approved her to have a ramp built, however she cannot find anyone to build it. Inside home she is modified independent with transfers to/from wheelchair. She reports aide or her mother assist her with return to supine on couch (lifing her legs).  Pt currently requires min assist for to/from supine (assist with legs). She required supervision to laterally scoot along EOB (no chair available on nursing unit). Patient reports she fell getting to her wheelchair because she did not use her sliding board to cover the gap "because it takes too long to use it." She refuses any rehab except HH. Anticipate patient will benefit from PT to address problems listed below.Will continue to follow acutely to maximize functional mobility independence and safety.          Recommendations for follow up therapy are one component of a multi-disciplinary discharge planning process, led by the attending physician.  Recommendations may be updated based on patient status, additional functional criteria and insurance authorization.  Follow Up Recommendations Can patient physically be transported by private vehicle: No      Assistance Recommended at Discharge Intermittent Supervision/Assistance  Patient can return home with the following  A little help with bathing/dressing/bathroom;Assistance with cooking/housework;Assist for transportation;Help with stairs or ramp for entrance    Equipment Recommendations None recommended by PT (Pt's w/c was in disrepair)  Recommendations for Other Services       Functional Status Assessment Patient has had a recent decline in their functional status and/or demonstrates limited ability to make significant improvements in function in a reasonable and predictable amount of time     Precautions / Restrictions Precautions Precautions: Fall Precaution Comments: Does not ambulate at baseline, Transfers only. Restrictions Weight Bearing Restrictions: No      Mobility  Bed Mobility Overal bed mobility: Needs Assistance Bed Mobility: Supine to Sit, Sit to Supine     Supine to sit: Min assist, HOB elevated Sit to supine: Min assist   General bed mobility comments: assist to move legs over EOB, then pt able to raise torso to sit and scoot herself out to EOB; assist to raise legs onto bed and reports her aide or her 66 yo mother helps her do this at home    Transfers Overall transfer level: Needs assistance Equipment used: None              Lateral/Scoot Transfers: Supervision General transfer comment: no chair available on nursing unit; lateral scoot along EOB with supervision. Pt reports she fell during transfer due to gap between surfaces and did not use her sliding board    Ambulation/Gait                  Stairs  [employer] officer mobility:  (pt reports uses her legs to propel manual chair or uses electric scooter)  Modified Rankin (Stroke Patients Only)       Balance Overall balance assessment: Needs assistance Sitting-balance support: Feet supported Sitting balance-Leahy Scale: Good          Standing balance comment: denies standing at home                             Pertinent Vitals/Pain Pain Assessment Pain Assessment: No/denies pain    Home Living Family/patient expects to be discharged to:: Private residence Living Arrangements: Alone Available Help at Discharge: Personal care attendant;Available PRN/intermittently (4 hours, 7 days a week) Type of Home: Apartment Home Access: Stairs to enter Entrance Stairs-Rails: None Entrance Stairs-Number of Steps: 3; patient currently has EMS take her in and out if needed; has gotten approval for getting a ramp built   Home Layout: One level Home Equipment: Rollator (4 wheels);BSC/3in1;Wheelchair - power;Wheelchair - manual Additional Comments: Pt reports that she sleeps on the couch. pt reports she is homebound and does not leave home without EMS assistance    Prior Function Prior Level of Function : Needs assist;History of Falls (last six months)       Physical Assist : ADLs (physical)   ADLs (physical): IADLs;Dressing;Bathing Mobility Comments: Pt reports that she mobilizes herself from couch to power wheelchair or manual WC via lateral scoot transfers. propels with bil LE's in manual WC. requires EMS to assist her in getting back up after falls. ADLs Comments: Bathes/dresses herself; states socks are difficult; wears gowns because they are easier; transfers on/off bed to change her depends, relies on aide for IADLs     Hand Dominance   Dominant Hand: Right    Extremity/Trunk Assessment   Upper Extremity Assessment Upper Extremity Assessment: Generalized weakness    Lower Extremity Assessment Lower Extremity Assessment: Generalized weakness    Cervical / Trunk Assessment Cervical / Trunk Assessment: Other exceptions;Normal Cervical / Trunk Exceptions: obesity  Communication   Communication: No difficulties  Cognition Arousal/Alertness: Awake/alert Behavior During Therapy: WFL for tasks  assessed/performed Overall Cognitive Status: Within Functional Limits for tasks assessed                                 General Comments: pt alert and oriented x4        General Comments      Exercises General Exercises - Lower Extremity Ankle Circles/Pumps: AROM, Left, AAROM, Right, 5 reps Long Arc Quad: AROM, Right, Left, 10 reps, Seated   Assessment/Plan    PT Assessment Patient needs continued PT services  PT Problem List Decreased strength;Decreased range of motion;Decreased activity tolerance;Decreased balance;Decreased mobility;Decreased knowledge of use of DME;Decreased safety awareness;Decreased knowledge of precautions;Obesity;Decreased coordination       PT Treatment Interventions DME instruction;Gait training;Functional mobility training;Therapeutic activities;Therapeutic exercise;Balance training;Neuromuscular re-education;Cognitive remediation;Patient/family education;Wheelchair mobility training    PT Goals (Current goals can be found in the Care Plan section)  Acute Rehab PT Goals Patient Stated Goal: to get stronger PT Goal Formulation: With patient Time For Goal Achievement: 07/06/22 Potential to Achieve Goals: Fair    Frequency Min 2X/week     Co-evaluation               AM-PAC PT "6 Clicks" Mobility  Outcome Measure Help needed turning from your back to your side  while in a flat bed without using bedrails?: None Help needed moving from lying on your back to sitting on the side of a flat bed without using bedrails?: A Little Help needed moving to and from a bed to a chair (including a wheelchair)?: A Little Help needed standing up from a chair using your arms (e.g., wheelchair or bedside chair)?: Total Help needed to walk in hospital room?: Total Help needed climbing 3-5 steps with a railing? : Total 6 Click Score: 13    End of Session   Activity Tolerance: Patient tolerated treatment well Patient left: in bed;with call  bell/phone within reach;with bed alarm set Nurse Communication: Mobility status PT Visit Diagnosis: Muscle weakness (generalized) (M62.81);Repeated falls (R29.6)    Time: 2956-2130 PT Time Calculation (min) (ACUTE ONLY): 24 min   Charges:   PT Evaluation $PT Eval Low Complexity: 1 Low PT Treatments $Therapeutic Activity: 8-22 mins         Jerolyn Center, PT Acute Rehabilitation Services  Office [phone]   Zena Amos 06/22/2022, 11:35 AM

## 2022-06-22 NOTE — Progress Notes (Signed)
CM awaiting palliative to see again tomorrow to determine d/c disposition plan. Pt is from home alone but daughter to assist.  TOC following.

## 2022-06-22 NOTE — Consult Note (Signed)
Consultation Note Date: 06/22/2022   Patient Name: Tiffany [name]  DOB: 1960/06/03  MRN: [id number]  Age / Sex: 62 y.o., female  PCP: Tiffany Kocher, DO Referring Physician: David Stall, Darin Engels, MD  Reason for Consultation: Establishing goals of care  HPI/Patient Profile: 61 y.o. female  with past medical history of type 2 diabetes, hypertension, chronic kidney disease, CAD, cardiac arrest with anoxic brain injury, schizoaffective disorder, COPD, and CHF admitted on 06/21/2022 with a fall.  Found to have elevated creatinine.  Patient with multiple recent admissions.  5 admissions in the last 6 months.  This admission creatinine found to be greater than 5.  Creatinine has steadily risen over the last 6 months.  She also has volume overload this admission and was started on Lasix.  PMT consulted to discuss goals of care.  Clinical Assessment and Goals of Care: I have reviewed medical records including EPIC notes, labs and imaging, assessed the patient and then met with patient to discuss diagnosis prognosis, GOC, EOL wishes, disposition and options.  I introduced Palliative Medicine as specialized medical care for people living with serious illness. It focuses on providing relief from the symptoms and stress of a serious illness. The goal is to improve quality of life for both the patient and the family.  Patient psychiatric history noted.  Also noted patient's history of noncompliance.  Reviewed previous psychiatry consults during patient's admissions and patient has consistently been determined to have the capacity to make her own decisions.  During my interaction with her today it also appeared she had the capacity to understand the decisions she was presented with and the results of the decisions she was making.  We discussed a brief life review of the patient.  Meztli tells me she has 3 children.  [name] closest with her daughter Ricarda Frame -Ricarda Frame is a  Engineer, civil (consulting) and lives in [place]. Ricarda Frame has a daughter the patient is very attached to and looks at pictures of every day.  Patient tells me she has 2 other sons, 1 lives in the mountains of [place] and the other lives in [place] however Darliene has not seen or spoken to him.  Patient tells me prior to admission she was doing well until she fell.  She tells me she has been wheelchair-bound for the past 2 years.  She tells me she is able to give herself "bird baths".  She has a caregiver that comes into the home for 4 hours every evening to assist her with ADLs.  She lives alone.  She tells me she has been eating great, eating "too much".   We discussed patient's current illness and what it means in the larger context of patient's on-going co-morbidities.  Natural disease trajectory and expectations at EOL were discussed.  We discussed patient's worsening kidney disease.  We discussed previously she had declined dialysis.  We also discussed that previously nephrology had determined that she was not a great dialysis candidate.  She tells me she understands this however she does request to speak with nephrology regarding current situation to get her questions answered.  I attempted to elicit values and goals of care important to the patient.  Patient tells me being at home and having good quality of life are important to her.  The difference between aggressive medical intervention and comfort care was considered in light of the patient's goals of care.   We also discussed CODE STATUS.  Patient is familiar with this conversation as she has had  it many times and has also had a cardiac arrest.  She tells me she does not think she would want resuscitation attempts anymore.  She also does not think she would ever want to be on a ventilator.  She shares she is not ready to commit to this decision and would like to speak to her daughter first.  Discussed with patient the importance of continued conversation  with family and the medical providers regarding overall plan of care and treatment options, ensuring decisions are within the context of the patient's values and GOCs.    Patient asks me to call her daughter and share situation with her and then she will plan to speak to her daughter more about goals of care.  Patient shares if she could ever not make decisions for herself she would want her daughter to be her decision-maker.  Questions and concerns were addressed.   I called patient's daughter.  We reviewed the above conversation.  Daughter does not think Joe would want to or tolerate dialysis well.  She anticipates a focus on comfort measures.  We also discussed CODE STATUS and daughter understands patient is considering changing CODE STATUS to DNR.  Daughter will plan to speak with patient after phone call and we will all speak again tomorrow.  Primary Decision Maker PATIENT    SUMMARY OF RECOMMENDATIONS   -Patient understands kidney disease and does not think she would want to pursue dialysis but would like to speak to nephrology about this -Patient would want her daughter to make medical decisions for her if she cannot make them for herself -Daughter was updated and will speak to patient later today regarding goals of care -Patient considering CODE STATUS changed to DNR/DNI -PMT will follow-up again tomorrow 6/7  Code Status/Advance Care Planning: Full code      Primary Diagnoses: Present on Admission:  AKI (acute kidney injury) (HCC)  Hypersomnia, Chronic  Physical deconditioning  Anemia of renal disease   I have reviewed the medical record, interviewed the patient and family, and examined the patient. The following aspects are pertinent.  Past Medical History:  Diagnosis Date   Abdominal pain    Acute encephalopathy 11/21/2017   Acute exacerbation of CHF (congestive heart failure) (HCC) 03/03/2022   Acute GI bleeding 03/29/2021   Acute kidney injury superimposed on  chronic kidney disease (HCC) 02/18/2020   Acute metabolic encephalopathy 02/20/2020   Acute on chronic diastolic heart failure (HCC) 02/04/2022   Acute on chronic renal failure (HCC) 06/02/2022   Aggressive behavior    Agitation 11/22/2017   Anoxic brain injury (HCC) 09/08/2016   C. Arrest due to respiratory failure and COPD exacerbation   Anxiety    Arthritis    "all over" (04/10/2016)   Asthma 10/18/2010   Binge eating disorder    Blood loss anemia 04/24/2011   CAP (community acquired pneumonia) 06/22/2015   Cardiac arrest (HCC) 09/08/2016   PEA   Carotid artery stenosis    1-39% bilateral by dopplers 11/2016   Chronic diastolic (congestive) heart failure (HCC)    Chronic kidney disease, stage 3 (HCC)    Chronic kidney disease, stage 3b (HCC) 06/06/2021   Chronic pain syndrome 06/18/2012   Chronic post-traumatic stress disorder (PTSD) 05/27/2018   Chronic respiratory failure with hypoxia and hypercapnia (HCC) 06/22/2015   TRILOGY Vent >AVAPA-ES., Vt target 200-400, Max P 30 , PS max 20 , PS min 6-10 , E Max 6, E Min 4, Rate Auto AVAPS Rate 2 (titrate for  pt comfort) , bleed O2 at 5l/m continuous flow .    CKD (chronic kidney disease) 02/03/2022   Closed displaced fracture of fifth metacarpal bone 03/21/2018   Cocaine use disorder, severe, in sustained remission (HCC) 12/17/2015   Complication of anesthesia    decreased bp, decreased heart rate   COPD (chronic obstructive pulmonary disease) (HCC) 07/08/2014   Delusional disorder, persecutory type (HCC) 06/26/2021   Depression    Diabetic neuropathy (HCC) 04/24/2011   Difficulty with speech 01/24/2018   Disorder of nervous system    Drug abuse (HCC) 11/21/2017   Dyslipidemia 04/24/2011   E. coli UTI 02/20/2020   Elevated troponin 04/28/2012   Emphysema    Encephalopathy 11/21/2017   Essential hypertension 03/22/2016   Facet arthropathy, lumbar 06/12/2022   Fibula fracture 07/10/2016   Frequent falls 10/11/2017   GERD  (gastroesophageal reflux disease)    GI bleed 03/30/2021   Gout 04/11/2017   Heart attack (HCC) 1980s   Hematochezia    History of blood transfusion 1994   "couldn't stop bleeding from my period"   History of drug abuse in remission (HCC) 11/28/2015   Quit in 2017   Hyperlipidemia 03/31/2021   Hyperlipidemia LDL goal <70    Hypocalcemia    Hypokalemia    Hypomagnesemia    Incontinence    Manic depression (HCC)    Morbid obesity (HCC) 10/18/2010   Obstructive sleep apnea 10/18/2010   On home oxygen therapy    "6L; 24/7" (04/10/2016)   OSA on CPAP    "wear mask sometimes" (04/10/2016)   Painless rectal bleeding 12/31/2015   Paranoid (HCC)    "sometimes; I'm on RX for it" (04/10/2016)   Periumbilical abdominal pain    Prolonged Q-T interval on ECG    Rectal bleeding 12/31/2015   Recurrent syncope 07/09/2021   Rhabdomyolysis 06/16/2021   Schizoaffective disorder, bipolar type (HCC) 04/05/2018   Seasonal allergies    Seborrheic keratoses 12/31/2013   Seizures (HCC)    "don't know what kind; last one was ~ 1 yr ago" (04/10/2016)   Sinus bradycardia    Skin ulcer of sacrum, limited to breakdown of skin (HCC) 03/08/2022   Spondylolisthesis at L3-L4 level 06/12/2022   Stroke [facility]) 1980s   denies residual on 04/10/2016   Thrush 09/19/2013   Type 2 diabetes mellitus (HCC) 10/18/2010   Social History   Socioeconomic History   Marital status: Widowed    Spouse name: Not on file   Number of children: 3   Years of education: 12th   Highest education level: 12th grade  Occupational History   Occupation: disabled    Comment: factory production  Tobacco Use   Smoking status: Former    Packs/day: 1.50    Years: 38.00    Additional pack years: 0.00    Total pack years: 57.00    Types: Cigarettes    Start date: 03/13/1977    Quit date: 04/10/2016    Years since quitting: 6.2    Passive exposure: Past   Smokeless tobacco: Never  Vaping Use   Vaping Use: Never used  Substance  and Sexual Activity   Alcohol use: No    Alcohol/week: 0.0 standard drinks of alcohol   Drug use: Not Currently    Types: Cocaine    Comment: 04/10/2016 "last used cocaine back in November 2017"   Sexual activity: Not Currently    Birth control/protection: Surgical  Other Topics Concern   Not on file  Social History Narrative  Has 1 son, [name]   Lives with son and his boyfriend   Her house has ramps and handrails should she ever needs them.    Her mother lives down the street from her and is a good support person in addition to her son.   She drives herself, has private transportation.    Cocaine free since 02/24/16, smoke free since 04/10/16   Social Determinants of Health   Financial Resource Strain: Medium Risk (07/24/2021)   Overall Financial Resource Strain (CARDIA)    Difficulty of Paying Living Expenses: Somewhat hard  Food Insecurity: No Food Insecurity (06/10/2022)   Hunger Vital Sign    Worried About Running Out of Food in the Last Year: Never true    Ran Out of Food in the Last Year: Never true  Recent Concern: Food Insecurity - Food Insecurity Present (06/09/2022)   Hunger Vital Sign    Worried About Running Out of Food in the Last Year: Sometimes true    Ran Out of Food in the Last Year: Patient declined  Transportation Needs: No Transportation Needs (06/15/2022)   PRAPARE - Administrator, Civil Service (Medical): No    Lack of Transportation (Non-Medical): No  Physical Activity: Inactive (07/24/2021)   Exercise Vital Sign    Days of Exercise per Week: 0 days    Minutes of Exercise per Session: 0 min  Stress: Stress Concern Present (07/24/2021)   [employer] of Occupational Health - Occupational Stress Questionnaire    Feeling of Stress : To some extent  Social Connections: Moderately Isolated (07/24/2021)   Social Connection and Isolation Panel [NHANES]    Frequency of Communication with Friends and Family: More than three times a week    Frequency of  Social Gatherings with Friends and Family: More than three times a week    Attends Religious Services: 1 to 4 times per year    Active Member of [employer] or Organizations: No    Attends Banker Meetings: Never    Marital Status: Widowed   Family History  Problem Relation Age of Onset   Cancer Mother        lung   Depression Mother    Cancer Father        prostate   Depression Sister    Anxiety disorder Sister    Schizophrenia Sister    Bipolar disorder Sister    Depression Sister    Depression Brother    Heart failure Other        cousin   Scheduled Meds:  amLODipine  10 mg Oral Daily   asenapine  5 mg Sublingual BID   atorvastatin  20 mg Oral Daily   carvedilol  12.5 mg Oral BID   escitalopram  20 mg Oral Daily   ferrous sulfate  325 mg Oral QODAY   folic acid  1 mg Oral Daily   furosemide  80 mg Intravenous Q8H   heparin  5,000 Units Subcutaneous Q8H   lamoTRIgine  100 mg Oral Daily   OLANZapine  10 mg Oral QHS   pantoprazole  40 mg Oral Daily   prazosin  2 mg Oral QHS   senna  1 tablet Oral BID   Continuous Infusions: PRN Meds:.acetaminophen **OR** acetaminophen, albuterol, hydrALAZINE, polyethylene glycol Allergies  Allergen Reactions   Asa [Aspirin] Other (See Comments)    GI bleeds   Hydrocodone Shortness Of Breath   Latuda [Lurasidone Hcl] Anaphylaxis   Magnesium-Containing Compounds Anaphylaxis and Other (See Comments)  Tolerated Ensure   Prednisone Anaphylaxis, Swelling and Other (See Comments)    Swelling of tongue, lips, throat   Ultram [Tramadol] Anaphylaxis, Swelling and Rash   Codeine Nausea And Vomiting and Rash   Desyrel [Trazodone] Other (See Comments)    Paranoia    Hydralazine Other (See Comments)    Muscle spasms   Peanut (Diagnostic) Hives and Other (See Comments)    Raw peanuts only  OK to eat peanut butter.   Topamax [Topiramate] Other (See Comments)    Paranoia   Sulfa Antibiotics Itching   Tape Rash   Zetia  [Ezetimibe] Itching and Rash   Review of Systems  Constitutional:  Positive for activity change and fatigue.  Neurological:  Positive for weakness.    Physical Exam Constitutional:      General: [name] not in acute distress.    Appearance: [name] ill-appearing.  Pulmonary:     Effort: Pulmonary effort is normal.  Skin:    General: Skin is warm and dry.  Neurological:     Mental Status: [name] alert and oriented to person, place, and time.     Vital Signs: BP 136/83   Pulse 71   Temp 98.4 F (36.9 C) (Oral)   Resp 20   Ht 5\' 4"  (1.626 m)   Wt 93.8 kg   SpO2 99%   BMI 35.50 kg/m  Pain Scale: 0-10   Pain Score: 0-No pain   SpO2: SpO2: 99 % O2 Device:SpO2: 99 % O2 Flow Rate: .   IO: Intake/output summary:  Intake/Output Summary (Last 24 hours) at 06/22/2022 1423 Last data filed at 06/22/2022 0400 Gross per 24 hour  Intake 200 ml  Output --  Net 200 ml    LBM: Last BM Date : 06/22/22 Baseline Weight: Weight: 93.8 kg Most recent weight: Weight: 93.8 kg     Palliative Assessment/Data: PPS 40%     *Please note that this is a verbal dictation therefore any spelling or grammatical errors are due to the "Dragon Medical One" system interpretation.  Gerlean Ren, DNP, AGNP-C Palliative Medicine Team [phone] Pager: [phone]

## 2022-06-22 NOTE — Progress Notes (Signed)
PT Cancellation Note  Patient Details Name: Tiffany [name] MRN: [id number] DOB: 1960-11-22   Cancelled Treatment:    Reason Eval/Treat Not Completed: Patient at procedure or test/unavailable  Patient bathing with nursing assist. Reports she is too tired right now. Will return   Jerolyn Center, PT Acute Rehabilitation Services  Office [phone]  Tiffany [name] 06/22/2022, 9:39 AM

## 2022-06-22 NOTE — Progress Notes (Signed)
   06/22/22 0610  Patient Belongings  Patient/Family advised about valuables policy? No (comment)  Home Medications No meds brought to hospital  Patient Belongings Kept at bedside  Belongings at Bedside Glasses;Jewelry;Electronic device(s)  Bedside: Jewelry Earrings;Necklace;Watch  Bedside: Electronic Device(s) Cellphone   Patient States she brought her home medication (oxycodone and pregbalin from home and the previous hospital has with them. This RN checked the patient chart there was no documentation .This RN spoke to  ED Charge RN, they don't have her medication in the ed as well as in pharmacy. Explained the same to the patient . Patient states she had a red bag with oxycodone and pregablin.

## 2022-06-22 NOTE — Progress Notes (Signed)
[facility] PROGRESS NOTE    Progress Note  Tiffany [name]  ZOX:[id number] DOB: 1960/09/05 DOA: 06/21/2022 PCP: Tiffany Kocher, DO     Brief Narrative:   Tiffany [name] is an 62 y.o. female past medical history diabetes mellitus type 2, essential hypertension blood pressure bilateral lower extremity edema on diuretic, chronic kidney disease stage V carotid artery disease, history of cardiac arrest and subsequent anoxic brain injury schizoaffective disorder, recently discharged from the hospital on 06/07/2020 for after a fall during that time she was in acute kidney injury nephrology was involved and creatinine stabilized.  Then admitted again and discharged on 06/13/2022 for bilateral hip pain and volume overload as well as alternative statins she was diuresed and discharged home with home health comes back in today after an unwitnessed fall was found to have an elevated creatinine imaging showed no acute finding except for chest x-ray that showed mild pulmonary edema   Assessment/Plan:   Acute kidney injury on chronic kidney disease stage V /volume overload: Overall her renal function is worse over the last 6 months.  Back in September 2020.  It was 1.2-1.8. She was seen by nephrologist in May patient had associated biopsy as she was interested in palliative approach and did not want dialysis. On admission now has worsening lower extremity, positive JVD and overall feeling fluid overloaded Will go ahead and start her on high-dose Lasix repeat electrolytes and monitor, recheck basic metabolic panel in the morning. Monitor strict I's and O's and daily weights. Consult Palliative care.  Schizoaffective disorder, severity of depression, chronic insomnia and polypharmacy: Continue on Lamictal, restart olanzapine Patient has a really bad attitude. Avoid narcotics.  Reviewed lumbar spinal stenosis/peripheral neuropathy/chronic L3-L4 osteomyelitis: She has been deemed not a good  candidate for surgical intervention. PT OT evaluation.  Patient does not want surgery.  Anemia of chronic renal disease Noted hemoglobin stable 8-9.  CAD/hyperlipidemia: Continue Coreg and Lipitor.  Diabetes mellitus type 2: A1c of 5 in 2023. On oral hypoglycemic agents.  Stage II sacral decubitus ulcer RN Pressure Injury Documentation: Pressure Injury Sacrum Mid Stage 2 -  Partial thickness loss of dermis presenting as a shallow open injury with a red, pink wound bed without slough. (Active)     Location: Sacrum  Location Orientation: Mid  Staging: Stage 2 -  Partial thickness loss of dermis presenting as a shallow open injury with a red, pink wound bed without slough.  Wound Description (Comments):   Present on Admission:   Dressing Type None 06/21/22 2308     Pressure Injury 06/21/22 Buttocks Stage 2 -  Partial thickness loss of dermis presenting as a shallow open injury with a red, pink wound bed without slough. healing stage brown and blaack (Active)  06/21/22 2326  Location: Buttocks  Location Orientation:   Staging: Stage 2 -  Partial thickness loss of dermis presenting as a shallow open injury with a red, pink wound bed without slough.  Wound Description (Comments): healing stage brown and blaack  Present on Admission:     DVT prophylaxis: lovenox Family Communication:none Status is: Inpatient Remains inpatient appropriate because: Mechanical fall and acute kidney injury    Code Status:     Code Status Orders  (From admission, onward)           Start     Ordered   06/21/22 2017  Full code  Continuous       Question:  By:  Answer:  Consent: discussion documented in EHR  06/21/22 2017           Code Status History     Date Active Date Inactive Code Status Order ID Comments User Context   06/10/2022 1522 06/13/2022 2203 Full Code [id number]  Alfredo Martinez, MD ED   06/02/2022 2108 06/08/2022 2030 Full Code [id number]  [name] Irish, MD ED    02/28/2022 0808 03/06/2022 1835 Full Code [id number]  Alicia Amel, MD ED   02/04/2022 0027 02/08/2022 0007 Full Code [id number]  Bess Kinds, MD ED   09/19/2021 1812 09/21/2021 2135 Full Code [id number]  Pilar Jarvis, MD ED   07/16/2021 0434 07/18/2021 2217 Full Code [id number]  Carroll Sage, PA-C ED   07/09/2021 2040 07/12/2021 1640 Full Code [id number]  John Giovanni, MD ED   06/26/2021 1255 06/27/2021 0658 Full Code [id number]  Franne Forts, DO ED   06/16/2021 1543 06/23/2021 0012 Full Code [id number]  Bobette Mo, MD ED   05/20/2021 0414 06/03/2021 0053 Full Code [id number]  Darlin Drop, DO ED   04/24/2021 1956 05/02/2021 1650 Full Code [id number]  Synetta Fail, MD ED   03/29/2021 2013 04/07/2021 0450 Full Code [id number]  Anselm Jungling, DO Inpatient   01/13/2021 0123 01/14/2021 2252 Full Code [id number]  Howerter, Chaney Born, DO ED   01/08/2021 1537 01/08/2021 1836 Full Code [id number]  Jacalyn Lefevre, MD ED   01/05/2021 2232 01/06/2021 2104 Full Code [id number]  Henderly, Britni A, PA-C ED   12/16/2020 2037 12/17/2020 1800 Full Code [id number]  Lorre Nick, MD ED   11/16/2020 1748 11/18/2020 0034 Full Code [id number]  Haskel Schroeder, PA-C ED   10/15/2020 1724 10/17/2020 1852 Full Code [id number]  Benjiman Core, MD ED   08/08/2020 1940 08/16/2020 2123 Full Code [id number]  Hillary Bow, DO ED   07/23/2020 0323 07/28/2020 2006 Full Code [id number]  John Giovanni, MD ED   05/11/2020 1811 05/16/2020 2124 Full Code [id number]  Emeline General, MD ED   02/18/2020 1641 02/26/2020 2237 Full Code [id number]  Steffanie Dunn, DO ED   10/27/2019 0921 11/03/2019 0024 Full Code [id number]  Teddy Spike, DO ED   02/03/2019 1731 02/04/2019 1655 Full Code [id number]  Bethann Berkshire, MD ED   02/03/2019 1427 02/03/2019 1731 Full Code [id number]  [name] Norfolk, DO ED   10/07/2018 0013 10/08/2018 1355 Full Code [id number]  Arthor Captain, PA-C ED   10/02/2018 0858 10/05/2018 1654 Full Code [id number]  Charlestine Night,  PA-C ED   11/21/2017 2227 11/23/2017 2056 Full Code [id number]  Ileana Roup, MD ED   06/19/2017 1412 06/19/2017 2027 Full Code [id number]  Dolores Patty, MD Inpatient   01/06/2017 0417 01/07/2017 2117 Full Code [id number]  Oralia Manis, DO Inpatient   11/03/2016 1953 11/05/2016 2243 Full Code [id number]  Marquette Saa, MD ED   10/18/2016 1838 10/20/2016 2043 Full Code [id number]  Leland Her, DO Inpatient   10/07/2016 0200 10/09/2016 2340 Full Code [id number]  Garth Bigness, MD ED   09/08/2016 0709 09/21/2016 1858 Full Code [id number]  Tobey Grim, NP ED   07/10/2016 0205 07/20/2016 2246 Full Code [id number]  Lovena Neighbours, MD Inpatient   05/09/2016 1015 05/12/2016 1816 Full Code [id number]  Araceli Bouche, DO Inpatient   04/21/2016 1813 04/24/2016 1826 Full Code [id number]  Palma Holter, MD ED   04/10/2016 1632 04/15/2016 2043 Full Code [id number]  Garth Bigness, MD Inpatient   12/01/2015 2144 12/06/2015 1920 Full Code [id number]  Shari Prows, MD Inpatient  11/25/2015 0141 12/01/2015 2137 Full Code [id number]  Kathee Delton, MD Inpatient   08/19/2015 2134 08/23/2015 1646 Full Code [id number]  Raliegh Ip, DO Inpatient   07/09/2015 1210 07/11/2015 1803 Full Code [id number]  Jaynie Crumble, PA-C ED   06/22/2015 0549 06/25/2015 1852 Full Code [id number]  Lupita Leash, MD ED   06/22/2015 0233 06/22/2015 0549 Full Code [id number]  Casey Burkitt, MD ED   07/08/2014 0331 07/09/2014 1836 Full Code [id number]  Smitty Cords, DO Inpatient   01/04/2013 0527 01/06/2013 2102 Full Code [id number]  Latrelle Dodrill, MD Inpatient   04/24/2011 1742 04/27/2011 2119 Full Code [id number]  Long, Dillon Bjork, RN Inpatient         IV Access:   Peripheral IV   Procedures and diagnostic studies:   DG Chest 2 View  Result Date: 06/21/2022 CLINICAL DATA:  fall, cp EXAM: CHEST - 2 VIEW COMPARISON:  CXR 06/09/22 FINDINGS: Cardiomegaly. No pleural effusion. No  pneumothorax. Findings of mild pulmonary edema. Chronic left-sided rib fractures left seventh and eighth ribs. No radiographically apparent new displaced rib fracture. Visualized upper abdomen is unremarkable. Vertebral body heights are maintained. IMPRESSION: Cardiomegaly with mild pulmonary edema. Electronically Signed   By: Lorenza Cambridge M.D.   On: 06/21/2022 18:04   CT HEAD WO CONTRAST ( )  Result Date: 06/21/2022 CLINICAL DATA:  Head trauma, moderate-severe fall, pt poor historian uncertain of head injury and some baseline AMS present; Neck trauma, midline tenderness (Age 78-64y) EXAM: CT HEAD WITHOUT CONTRAST CT CERVICAL SPINE WITHOUT CONTRAST TECHNIQUE: Multidetector CT imaging of the head and cervical spine was performed following the standard protocol without intravenous contrast. Multiplanar CT image reconstructions of the cervical spine were also generated. RADIATION DOSE REDUCTION: This exam was performed according to the departmental dose-optimization program which includes automated exposure control, adjustment of the mA and/or kV according to patient size and/or use of iterative reconstruction technique. COMPARISON:  CT Head 07/09/21 FINDINGS: CT HEAD FINDINGS Brain: No evidence of acute infarction, hemorrhage, hydrocephalus, extra-axial collection or mass lesion/mass effect. Sequela of mild chronic microvascular ischemic change. Vascular: No hyperdense vessel or unexpected calcification. Skull: Normal. Negative for fracture or focal lesion. Sinuses/Orbits: Small bilateral mastoid effusions. No middle ear effusion. Paranasal sinuses are clear. Unremarkable. Other: None. CT CERVICAL SPINE FINDINGS Alignment: There is straightening of the normal cervical lordosis. Skull base and vertebrae: No acute fracture. No primary bone lesion or focal pathologic process. Soft tissues and spinal canal: No prevertebral fluid or swelling. No visible canal hematoma. Disc levels:  No evidence of high-grade canal  stenosis. Upper chest: Negative. Other: None IMPRESSION: 1. No acute intracranial abnormality. 2. No acute fracture or traumatic subluxation of the cervical spine. 3. Small bilateral mastoid effusions. Electronically Signed   By: Lorenza Cambridge M.D.   On: 06/21/2022 18:01   CT Cervical Spine Wo Contrast  Result Date: 06/21/2022 CLINICAL DATA:  Head trauma, moderate-severe fall, pt poor historian uncertain of head injury and some baseline AMS present; Neck trauma, midline tenderness (Age 53-64y) EXAM: CT HEAD WITHOUT CONTRAST CT CERVICAL SPINE WITHOUT CONTRAST TECHNIQUE: Multidetector CT imaging of the head and cervical spine was performed following the standard protocol without intravenous contrast. Multiplanar CT image reconstructions of the cervical spine were also generated. RADIATION DOSE REDUCTION: This exam was performed according to the departmental dose-optimization program which includes automated exposure control, adjustment of the mA and/or kV according to patient size and/or use of iterative reconstruction technique. COMPARISON:  CT Head 07/09/21 FINDINGS:  CT HEAD FINDINGS Brain: No evidence of acute infarction, hemorrhage, hydrocephalus, extra-axial collection or mass lesion/mass effect. Sequela of mild chronic microvascular ischemic change. Vascular: No hyperdense vessel or unexpected calcification. Skull: Normal. Negative for fracture or focal lesion. Sinuses/Orbits: Small bilateral mastoid effusions. No middle ear effusion. Paranasal sinuses are clear. Unremarkable. Other: None. CT CERVICAL SPINE FINDINGS Alignment: There is straightening of the normal cervical lordosis. Skull base and vertebrae: No acute fracture. No primary bone lesion or focal pathologic process. Soft tissues and spinal canal: No prevertebral fluid or swelling. No visible canal hematoma. Disc levels:  No evidence of high-grade canal stenosis. Upper chest: Negative. Other: None IMPRESSION: 1. No acute intracranial abnormality. 2.  No acute fracture or traumatic subluxation of the cervical spine. 3. Small bilateral mastoid effusions. Electronically Signed   By: Lorenza Cambridge M.D.   On: 06/21/2022 18:01     Medical Consultants:   None.   Subjective:    [name] patient with a bad attitude this morning.  Objective:    Vitals:   06/21/22 2130 06/21/22 2215 06/21/22 2258 06/22/22 0444  BP: 117/69 127/78 135/73 135/85  Pulse: 64 63 66 61  Resp: 18 18 16 20   Temp:   98 F (36.7 C) 98.6 F (37 C)  TempSrc:   Oral Oral  SpO2: 95% 96% 100% 100%  Weight:      Height:       SpO2: 100 %   Intake/Output Summary (Last 24 hours) at 06/22/2022 0803 Last data filed at 06/22/2022 0400 Gross per 24 hour  Intake 200 ml  Output --  Net 200 ml   Filed Weights   06/21/22 1559  Weight: 93.8 kg    Exam: General exam: In no acute distress. Respiratory system: Good air movement and crackles at bedside Cardiovascular system: S1 & S2 heard, RRR.  Positive JVD Gastrointestinal system: Abdomen is nondistended, soft and nontender.  Extremities: 3+ edema Skin: No rashes, lesions or ulcers Psychiatry: Judgement and insight appear normal. Mood & affect appropriate.    Data Reviewed:    Labs: Basic Metabolic Panel: Recent Labs  Lab 06/21/22 1636 06/22/22 0439  NA 137 139  K 4.9 4.1  CL 105 109  CO2 21* 20*  GLUCOSE 113* 90  BUN 107* 108*  CREATININE 5.44* 5.48*  CALCIUM 8.9 8.4*   GFR Estimated Creatinine Clearance: 11.7 mL/min (A) (by C-G formula based on SCr of 5.48 mg/dL (H)). Liver Function Tests: Recent Labs  Lab 06/21/22 1636  AST 23  ALT 12  ALKPHOS 138*  BILITOT 0.4  PROT 7.2  ALBUMIN 3.8   No results for input(s): "LIPASE", "AMYLASE" in the last 168 hours. No results for input(s): "AMMONIA" in the last 168 hours. Coagulation profile No results for input(s): "INR", "PROTIME" in the last 168 hours. COVID-19 Labs  No results for input(s): "DDIMER", "FERRITIN", "LDH", "CRP" in the  last 72 hours.  Lab Results  Component Value Date   SARSCOV2NAA NEGATIVE 06/02/2022   SARSCOV2NAA NEGATIVE 02/28/2022   SARSCOV2NAA NEGATIVE 02/03/2022   SARSCOV2NAA NEGATIVE 09/19/2021    CBC: Recent Labs  Lab 06/21/22 1636 06/22/22 0439  WBC 5.0 3.6*  HGB 8.6* 7.8*  HCT 28.8* 26.4*  MCV 92.0 92.3  PLT 236 198   Cardiac Enzymes: Recent Labs  Lab 06/21/22 1636  CKTOTAL 623*   BNP (last 3 results) Recent Labs    05/05/22 1315 05/19/22 1408  PROBNP 1,007* 2,269*   CBG: No results for input(s): "GLUCAP" in the  last 168 hours. D-Dimer: No results for input(s): "DDIMER" in the last 72 hours. Hgb A1c: No results for input(s): "HGBA1C" in the last 72 hours. Lipid Profile: No results for input(s): "CHOL", "HDL", "LDLCALC", "TRIG", "CHOLHDL", "LDLDIRECT" in the last 72 hours. Thyroid function studies: No results for input(s): "TSH", "T4TOTAL", "T3FREE", "THYROIDAB" in the last 72 hours.  Invalid input(s): "FREET3" Anemia work up: No results for input(s): "VITAMINB12", "FOLATE", "FERRITIN", "TIBC", "IRON", "RETICCTPCT" in the last 72 hours. Sepsis Labs: Recent Labs  Lab 06/21/22 1636 06/22/22 0439  WBC 5.0 3.6*   Microbiology No results found for this or any previous visit (from the past 240 hour(s)).   Medications:    amLODipine  10 mg Oral Daily   atorvastatin  20 mg Oral Daily   carvedilol  12.5 mg Oral BID   ferrous sulfate  325 mg Oral QODAY   folic acid  1 mg Oral Daily   heparin  5,000 Units Subcutaneous Q8H   lamoTRIgine  100 mg Oral Daily   prazosin  2 mg Oral QHS   senna  1 tablet Oral BID   Continuous Infusions:    LOS: 1 day   Marinda Elk  [facility]  06/22/2022, 8:03 AM

## 2022-06-23 DIAGNOSIS — G4710 Hypersomnia, unspecified: Secondary | ICD-10-CM | POA: Diagnosis not present

## 2022-06-23 DIAGNOSIS — N189 Chronic kidney disease, unspecified: Secondary | ICD-10-CM | POA: Diagnosis not present

## 2022-06-23 DIAGNOSIS — N179 Acute kidney failure, unspecified: Secondary | ICD-10-CM | POA: Diagnosis not present

## 2022-06-23 DIAGNOSIS — R5381 Other malaise: Secondary | ICD-10-CM

## 2022-06-23 LAB — BASIC METABOLIC PANEL
Anion gap: 14 (ref 5–15)
BUN: 103 mg/dL — ABNORMAL HIGH (ref 8–23)
CO2: 22 mmol/L (ref 22–32)
Calcium: 9 mg/dL (ref 8.9–10.3)
Chloride: 105 mmol/L (ref 98–111)
Creatinine, Ser: 5.52 mg/dL — ABNORMAL HIGH (ref 0.44–1.00)
GFR, Estimated: 8 mL/min — ABNORMAL LOW (ref >60)
Glucose, Bld: 96 mg/dL (ref 70–99)
Potassium: 3.5 mmol/L (ref 3.5–5.1)
Sodium: 141 mmol/L (ref 135–145)

## 2022-06-23 LAB — IRON AND TIBC
Iron: 47 ug/dL (ref 28–170)
Saturation Ratios: 12 % (ref 10.4–31.8)
TIBC: 386 ug/dL (ref 250–450)
UIBC: 339 ug/dL

## 2022-06-23 LAB — FERRITIN: Ferritin: 35 ng/mL (ref 11–307)

## 2022-06-23 LAB — PHOSPHORUS: Phosphorus: 5 mg/dL — ABNORMAL HIGH (ref 2.5–4.6)

## 2022-06-23 MED ORDER — DARBEPOETIN ALFA 100 MCG/0.5ML IJ SOSY
100.0000 ug | PREFILLED_SYRINGE | Freq: Once | INTRAMUSCULAR | Status: AC
  Administered 2022-06-23: 100 ug via SUBCUTANEOUS
  Filled 2022-06-23 (×2): qty 0.5

## 2022-06-23 MED ORDER — PREGABALIN 100 MG PO CAPS
100.0000 mg | ORAL_CAPSULE | Freq: Two times a day (BID) | ORAL | Status: DC
  Filled 2022-06-23 (×2): qty 1

## 2022-06-23 NOTE — Consult Note (Signed)
Reason for Consult:Renal failure Referring Physician: Dr. Jacqulyn Bath  Chief Complaint: Unwitnessed fall  Assessment/Plan: Oliguric acute kidney injury in setting of chronic renal disease stage IV: Overall, her renal function has been worsening steadily over the ; of note she was in the 1.2-1.8 range as recently as 09/19/21 and then now consistently in the 2.6-3 range earlier 2024 with progression into the 4's in 05/2022. [name] Patient chronically uses diuretics for her lower extremity edema.   - Has had progression in renal decline and urine sediment did not show RBC's; has also had negative serologic w/u which was not surprising. -  Ordinarily with rapid progression would consider biopsy of the LK (may not have much function in the RK which is only 7.4 cm in length) but high risk of bleed which would lead to ESRD. - I had discussed options with dialysis as she is progressing rapidly and she really doesn't want dialysis; she's also not a great candidate for dialysis given age, comorbidities, functional status, wheelchair bound, OSA, DM in the past, CASHD, h/o cardiac arrest, COPD. I counseled her that I'm not convinced she will do well because of the multitude of medical issues but she is insistent on starting dialysis if needed. Mainly it's because her 30 y/o granddaughter wanted her around for longer.  - No absolute indication for dialysis but she is certainly moving in that direction. At this point would continue the diuresis; may need to decrease to bid dosing tomorrow. Will assess daily for dialysis and should be able to wait till early in the week if it becomes necessary. Fortunately her UOP is still very good and responding to diuresis. Chronic anemia: Appears to be at baseline, no signs or symptoms of acute bleeding. Likely 2/2 renal disease. Will dose with ESA.  [name] asymp.   COPD/OSA: On bronchodilators, sating well on room air currently.  Chronic diastolic heart failure: See above with  diuresis. response. CAD with history of cardiac arrest: Per primary Type II diabetes mellitus: Sugars at goal, last A1c at goal.  HPI: Tiffany [name] is an 62 y.o. female 62 y.o. female with chronic renal disease IV, schizoaffective disorder, COPD, morbid obesity, obstructive sleep apnea, type II diabetes mellitus, hypertension, hyperlipidemia, coronary artery disease, history of cardiac arrest and anoxic brain jury, history of cocaine use (last use ~3 years ago) with multiple admissions recently  with acute on chronic renal failure, fall, hip pain, AMS, generally diuresed with improvement and then discharged. Patient coming in again with an unwitnessed floor found by the home health nurse. Creatinine noted to be 5.44 c/w 4.58 at time of discharge week prior to readmission. Imaging with CT did n ot show any acute injury and CXR showed pulmonary edema. Patient has been diuresing with Lasix 80mg  q8hrs.   She denies any recent fevers, nausea, vomiting, cough, hemoptysis, abdominal pain, constipation, diarrhea, dysuria, change in urine color, recent night sweats, weight loss, IV drug use. Patient last used cocaine roughly three years ago and has not been sexually active in the last six years.   ROS Pertinent items are noted in HPI.  Chemistry and CBC: Creat  Date/Time Value Ref Range Status  02/18/2016 11:46 AM 1.22 (H) 0.50 - 1.05 mg/dL Final    Comment:      For patients > or = 63 years of age: The upper reference limit for Creatinine is approximately 13% higher for people identified as African-American.     12/16/2015 12:29 PM 1.50 (H) 0.50 - 1.05 mg/dL  Final    Comment:      For patients > or = 63 years of age: The upper reference limit for Creatinine is approximately 13% higher for people identified as African-American.     08/24/2015 03:05 PM 1.06 (H) 0.50 - 1.05 mg/dL Final    Comment:      For patients > or = 63 years of age: The upper reference limit for Creatinine is  approximately 13% higher for people identified as African-American.     01/20/2015 04:11 PM 1.13 (H) 0.50 - 1.05 mg/dL Final  45/40/9811 91:47 AM 0.98 0.50 - 1.05 mg/dL Final  82/95/6213 08:65 PM 1.40 (H) 0.50 - 1.05 mg/dL Final  78/46/9629 52:84 AM 1.08 0.50 - 1.10 mg/dL Final  13/24/4010 27:25 PM 1.01 0.50 - 1.10 mg/dL Final  36/64/4034 74:25 PM 0.79 0.50 - 1.10 mg/dL Final  95/63/8756 43:32 PM 0.99 0.50 - 1.10 mg/dL Final  95/18/8416 60:63 PM 0.89 0.50 - 1.10 mg/dL Final  01/60/1093 23:55 PM 1.57 (H) 0.50 - 1.10 mg/dL Final   Creatinine, Ser  Date/Time Value Ref Range Status  06/23/2022 07:03 AM 5.52 (H) 0.44 - 1.00 mg/dL Final  73/22/0254 27:06 AM 5.48 (H) 0.44 - 1.00 mg/dL Final  23/76/2831 51:76 PM 5.44 (H) 0.44 - 1.00 mg/dL Final  16/07/3708 62:69 AM 4.58 (H) 0.44 - 1.00 mg/dL Final  48/54/6270 35:00 AM 4.74 (H) 0.44 - 1.00 mg/dL Final  93/81/8299 37:16 AM 4.92 (H) 0.44 - 1.00 mg/dL Final  96/78/9381 01:75 PM 4.78 (H) 0.44 - 1.00 mg/dL Final  11/09/8525 78:24 AM 4.60 (H) 0.44 - 1.00 mg/dL Final  23/53/6144 31:54 AM 4.33 (H) 0.44 - 1.00 mg/dL Final  00/86/7619 50:93 AM 4.17 (H) 0.44 - 1.00 mg/dL Final  26/71/2458 09:98 AM 4.13 (H) 0.44 - 1.00 mg/dL Final  33/82/5053 97:67 AM 4.55 (H) 0.44 - 1.00 mg/dL Final  34/19/3790 24:09 AM 4.51 (H) 0.44 - 1.00 mg/dL Final  73/53/2992 42:68 PM 4.73 (H) 0.44 - 1.00 mg/dL Final  34/19/6222 97:98 PM 4.19 (H) 0.57 - 1.00 mg/dL Final  92/11/9415 40:81 PM 3.65 (H) 0.57 - 1.00 mg/dL Final  44/81/8563 14:97 AM 2.73 (H) 0.44 - 1.00 mg/dL Final  02/63/7858 85:02 AM 2.90 (H) 0.44 - 1.00 mg/dL Final  77/41/2878 67:67 AM 3.27 (H) 0.44 - 1.00 mg/dL Final  20/94/7096 28:36 AM 2.74 (H) 0.44 - 1.00 mg/dL Final  62/94/7654 65:03 AM 2.95 (H) 0.44 - 1.00 mg/dL Final  54/65/6812 75:17 AM 3.08 (H) 0.44 - 1.00 mg/dL Final  00/17/4944 96:75 PM 3.14 (H) 0.44 - 1.00 mg/dL Final  91/63/8466 59:93 PM 3.27 (H) 0.44 - 1.00 mg/dL Final  57/01/7791 90:30 AM 3.36  (H) 0.44 - 1.00 mg/dL Final  10/08/3005 62:26 PM 2.83 (H) 0.57 - 1.00 mg/dL Final  33/35/4562 56:38 AM 2.70 (H) 0.44 - 1.00 mg/dL Final  93/73/4287 68:11 PM 2.69 (H) 0.44 - 1.00 mg/dL Final  57/26/2035 59:74 AM 2.62 (H) 0.44 - 1.00 mg/dL Final  16/38/4536 46:80 AM 2.89 (H) 0.44 - 1.00 mg/dL Final  32/12/2480 50:03 AM 2.77 (H) 0.44 - 1.00 mg/dL Final  70/48/8891 69:45 AM 2.90 (H) 0.44 - 1.00 mg/dL Final  03/88/8280 03:49 PM 2.93 (H) 0.44 - 1.00 mg/dL Final  17/91/5056 97:94 PM 2.48 (H) 0.57 - 1.00 mg/dL Final  80/16/5537 48:27 PM 1.28 (H) 0.44 - 1.00 mg/dL Final  07/86/7544 92:01 PM 1.82 (H) 0.44 - 1.00 mg/dL Final  00/71/2197 58:83 AM 1.00 0.44 - 1.00 mg/dL Final  25/49/8264 15:83 AM 1.49 (  H) 0.44 - 1.00 mg/dL Final  16/10/9602 54:09 AM 1.14 (H) 0.44 - 1.00 mg/dL Final  81/19/1478 29:56 AM 1.27 (H) 0.44 - 1.00 mg/dL Final  21/30/8657 84:69 PM 1.39 (H) 0.44 - 1.00 mg/dL Final  62/95/2841 32:44 PM 1.72 (H) 0.44 - 1.00 mg/dL Final  01/18/7251 66:44 AM 1.57 (H) 0.44 - 1.00 mg/dL Final  03/47/4259 56:38 AM 1.62 (H) 0.44 - 1.00 mg/dL Final  75/64/3329 51:88 AM 1.95 (H) 0.44 - 1.00 mg/dL Final  41/66/0630 16:01 PM 2.16 (H) 0.44 - 1.00 mg/dL Final  09/32/3557 32:20 AM 1.69 (H) 0.44 - 1.00 mg/dL Final  25/42/7062 37:62 AM 1.40 (H) 0.44 - 1.00 mg/dL Final  83/15/1761 60:73 AM 1.41 (H) 0.44 - 1.00 mg/dL Final  71/06/2692 85:46 AM 1.64 (H) 0.44 - 1.00 mg/dL Final  27/03/5007 38:18 AM 1.19 (H) 0.44 - 1.00 mg/dL Final  29/93/7169 67:89 AM 1.21 (H) 0.44 - 1.00 mg/dL Final   Recent Labs  Lab 06/21/22 1636 06/22/22 0439 06/23/22 0703  NA 137 139 141  K 4.9 4.1 3.5  CL 105 109 105  CO2 21* 20* 22  GLUCOSE 113* 90 96  BUN 107* 108* 103*  CREATININE 5.44* 5.48* 5.52*  CALCIUM 8.9 8.4* 9.0   Recent Labs  Lab 06/21/22 1636 06/22/22 0439  WBC 5.0 3.6*  HGB 8.6* 7.8*  HCT 28.8* 26.4*  MCV 92.0 92.3  PLT 236 198   Liver Function Tests: Recent Labs  Lab 06/21/22 1636  AST 23  ALT 12   ALKPHOS 138*  BILITOT 0.4  PROT 7.2  ALBUMIN 3.8   No results for input(s): "LIPASE", "AMYLASE" in the last 168 hours. No results for input(s): "AMMONIA" in the last 168 hours. Cardiac Enzymes: Recent Labs  Lab 06/21/22 1636  CKTOTAL 623*   Iron Studies: No results for input(s): "IRON", "TIBC", "TRANSFERRIN", "FERRITIN" in the last 72 hours. PT/INR: @LABRCNTIP (inr:5)  Xrays/Other Studies: ) Results for orders placed or performed during the hospital encounter of 06/21/22 (from the past 48 hour(s))  Basic metabolic panel     Status: Abnormal   Collection Time: 06/22/22  4:39 AM  Result Value Ref Range   Sodium 139 135 - 145 mmol/L   Potassium 4.1 3.5 - 5.1 mmol/L   Chloride 109 98 - 111 mmol/L   CO2 20 (L) 22 - 32 mmol/L   Glucose, Bld 90 70 - 99 mg/dL    Comment: Glucose reference range applies only to samples taken after fasting for at least 8 hours.   BUN 108 (H) 8 - 23 mg/dL   Creatinine, Ser 3.81 (H) 0.44 - 1.00 mg/dL   Calcium 8.4 (L) 8.9 - 10.3 mg/dL   GFR, Estimated 8 (L) >60 mL/min    Comment: (NOTE) Calculated using the CKD-EPI Creatinine Equation (2021)    Anion gap 10 5 - 15    Comment: Performed at [facility], [address], [place], [place] [address]  CBC     Status: Abnormal   Collection Time: 06/22/22  4:39 AM  Result Value Ref Range   WBC 3.6 (L) 4.0 - 10.5 K/uL   RBC 2.86 (L) 3.87 - 5.11 MIL/uL   Hemoglobin 7.8 (L) 12.0 - 15.0 g/dL   HCT 02.5 (L) 85.2 - 77.8 %   MCV 92.3 80.0 - 100.0 fL   MCH 27.3 26.0 - 34.0 pg   MCHC 29.5 (L) 30.0 - 36.0 g/dL   RDW 24.2 (H) 35.3 - 61.4 %   Platelets 198 150 -  400 K/uL   nRBC 0.0 0.0 - 0.2 %    Comment: Performed at [facility], [address], [place], [place] [address]  Basic metabolic panel     Status: Abnormal   Collection Time: 06/23/22  7:03 AM  Result Value Ref Range   Sodium 141 135 - 145 mmol/L   Potassium 3.5 3.5 - 5.1 mmol/L   Chloride 105 98 - 111 mmol/L   CO2 22 22 - 32 mmol/L    Glucose, Bld 96 70 - 99 mg/dL    Comment: Glucose reference range applies only to samples taken after fasting for at least 8 hours.   BUN 103 (H) 8 - 23 mg/dL   Creatinine, Ser 6.04 (H) 0.44 - 1.00 mg/dL   Calcium 9.0 8.9 - 54.0 mg/dL   GFR, Estimated 8 (L) >60 mL/min    Comment: (NOTE) Calculated using the CKD-EPI Creatinine Equation (2021)    Anion gap 14 5 - 15    Comment: Performed at [facility], [address] [place], [place] [address]   *Note: Due to a large number of results and/or encounters for the requested time period, some results have not been displayed. A complete set of results can be found in Results Review.   DG Chest 2 View  Result Date: 06/21/2022 CLINICAL DATA:  fall, cp EXAM: CHEST - 2 VIEW COMPARISON:  CXR 06/09/22 FINDINGS: Cardiomegaly. No pleural effusion. No pneumothorax. Findings of mild pulmonary edema. Chronic left-sided rib fractures left seventh and eighth ribs. No radiographically apparent new displaced rib fracture. Visualized upper abdomen is unremarkable. Vertebral body heights are maintained. IMPRESSION: Cardiomegaly with mild pulmonary edema. Electronically Signed   By: Lorenza Cambridge M.D.   On: 06/21/2022 18:04   CT HEAD WO CONTRAST ( )  Result Date: 06/21/2022 CLINICAL DATA:  Head trauma, moderate-severe fall, pt poor historian uncertain of head injury and some baseline AMS present; Neck trauma, midline tenderness (Age 52-64y) EXAM: CT HEAD WITHOUT CONTRAST CT CERVICAL SPINE WITHOUT CONTRAST TECHNIQUE: Multidetector CT imaging of the head and cervical spine was performed following the standard protocol without intravenous contrast. Multiplanar CT image reconstructions of the cervical spine were also generated. RADIATION DOSE REDUCTION: This exam was performed according to the departmental dose-optimization program which includes automated exposure control, adjustment of the mA and/or kV according to patient size and/or use of iterative  reconstruction technique. COMPARISON:  CT Head 07/09/21 FINDINGS: CT HEAD FINDINGS Brain: No evidence of acute infarction, hemorrhage, hydrocephalus, extra-axial collection or mass lesion/mass effect. Sequela of mild chronic microvascular ischemic change. Vascular: No hyperdense vessel or unexpected calcification. Skull: Normal. Negative for fracture or focal lesion. Sinuses/Orbits: Small bilateral mastoid effusions. No middle ear effusion. Paranasal sinuses are clear. Unremarkable. Other: None. CT CERVICAL SPINE FINDINGS Alignment: There is straightening of the normal cervical lordosis. Skull base and vertebrae: No acute fracture. No primary bone lesion or focal pathologic process. Soft tissues and spinal canal: No prevertebral fluid or swelling. No visible canal hematoma. Disc levels:  No evidence of high-grade canal stenosis. Upper chest: Negative. Other: None IMPRESSION: 1. No acute intracranial abnormality. 2. No acute fracture or traumatic subluxation of the cervical spine. 3. Small bilateral mastoid effusions. Electronically Signed   By: Lorenza Cambridge M.D.   On: 06/21/2022 18:01   CT Cervical Spine Wo Contrast  Result Date: 06/21/2022 CLINICAL DATA:  Head trauma, moderate-severe fall, pt poor historian uncertain of head injury and some baseline AMS present; Neck trauma, midline tenderness (Age 89 818 0222)  EXAM: CT HEAD WITHOUT CONTRAST CT CERVICAL SPINE WITHOUT CONTRAST TECHNIQUE: Multidetector CT imaging of the head and cervical spine was performed following the standard protocol without intravenous contrast. Multiplanar CT image reconstructions of the cervical spine were also generated. RADIATION DOSE REDUCTION: This exam was performed according to the departmental dose-optimization program which includes automated exposure control, adjustment of the mA and/or kV according to patient size and/or use of iterative reconstruction technique. COMPARISON:  CT Head 07/09/21 FINDINGS: CT HEAD FINDINGS Brain: No  evidence of acute infarction, hemorrhage, hydrocephalus, extra-axial collection or mass lesion/mass effect. Sequela of mild chronic microvascular ischemic change. Vascular: No hyperdense vessel or unexpected calcification. Skull: Normal. Negative for fracture or focal lesion. Sinuses/Orbits: Small bilateral mastoid effusions. No middle ear effusion. Paranasal sinuses are clear. Unremarkable. Other: None. CT CERVICAL SPINE FINDINGS Alignment: There is straightening of the normal cervical lordosis. Skull base and vertebrae: No acute fracture. No primary bone lesion or focal pathologic process. Soft tissues and spinal canal: No prevertebral fluid or swelling. No visible canal hematoma. Disc levels:  No evidence of high-grade canal stenosis. Upper chest: Negative. Other: None IMPRESSION: 1. No acute intracranial abnormality. 2. No acute fracture or traumatic subluxation of the cervical spine. 3. Small bilateral mastoid effusions. Electronically Signed   By: Lorenza Cambridge M.D.   On: 06/21/2022 18:01    PMH:   Past Medical History:  Diagnosis Date   Abdominal pain    Acute encephalopathy 11/21/2017   Acute exacerbation of CHF (congestive heart failure) (HCC) 03/03/2022   Acute GI bleeding 03/29/2021   Acute kidney injury superimposed on chronic kidney disease (HCC) 02/18/2020   Acute metabolic encephalopathy 02/20/2020   Acute on chronic diastolic heart failure (HCC) 02/04/2022   Acute on chronic renal failure (HCC) 06/02/2022   Aggressive behavior    Agitation 11/22/2017   Anoxic brain injury (HCC) 09/08/2016   C. Arrest due to respiratory failure and COPD exacerbation   Anxiety    Arthritis    "all over" (04/10/2016)   Asthma 10/18/2010   Binge eating disorder    Blood loss anemia 04/24/2011   CAP (community acquired pneumonia) 06/22/2015   Cardiac arrest (HCC) 09/08/2016   PEA   Carotid artery stenosis    1-39% bilateral by dopplers 11/2016   Chronic diastolic (congestive) heart failure (HCC)     Chronic kidney disease, stage 3 (HCC)    Chronic kidney disease, stage 3b (HCC) 06/06/2021   Chronic pain syndrome 06/18/2012   Chronic post-traumatic stress disorder (PTSD) 05/27/2018   Chronic respiratory failure with hypoxia and hypercapnia (HCC) 06/22/2015   TRILOGY Vent >AVAPA-ES., Vt target 200-400, Max P 30 , PS max 20 , PS min 6-10 , E Max 6, E Min 4, Rate Auto AVAPS Rate 2 (titrate for pt comfort) , bleed O2 at 5l/m continuous flow .    CKD (chronic kidney disease) 02/03/2022   Closed displaced fracture of fifth metacarpal bone 03/21/2018   Cocaine use disorder, severe, in sustained remission (HCC) 12/17/2015   Complication of anesthesia    decreased bp, decreased heart rate   COPD (chronic obstructive pulmonary disease) (HCC) 07/08/2014   Delusional disorder, persecutory type (HCC) 06/26/2021   Depression    Diabetic neuropathy (HCC) 04/24/2011   Difficulty with speech 01/24/2018   Disorder of nervous system    Drug abuse (HCC) 11/21/2017   Dyslipidemia 04/24/2011   E. coli UTI 02/20/2020   Elevated troponin 04/28/2012   Emphysema    Encephalopathy 11/21/2017   Essential hypertension  03/22/2016   Facet arthropathy, lumbar 06/12/2022   Fibula fracture 07/10/2016   Frequent falls 10/11/2017   GERD (gastroesophageal reflux disease)    GI bleed 03/30/2021   Gout 04/11/2017   Heart attack (HCC) 1980s   Hematochezia    History of blood transfusion 1994   "couldn't stop bleeding from my period"   History of drug abuse in remission (HCC) 11/28/2015   Quit in 2017   Hyperlipidemia 03/31/2021   Hyperlipidemia LDL goal <70    Hypocalcemia    Hypokalemia    Hypomagnesemia    Incontinence    Manic depression (HCC)    Morbid obesity (HCC) 10/18/2010   Obstructive sleep apnea 10/18/2010   On home oxygen therapy    "6L; 24/7" (04/10/2016)   OSA on CPAP    "wear mask sometimes" (04/10/2016)   Painless rectal bleeding 12/31/2015   Paranoid (HCC)    "sometimes; I'm on RX  for it" (04/10/2016)   Periumbilical abdominal pain    Prolonged Q-T interval on ECG    Rectal bleeding 12/31/2015   Recurrent syncope 07/09/2021   Rhabdomyolysis 06/16/2021   Schizoaffective disorder, bipolar type (HCC) 04/05/2018   Seasonal allergies    Seborrheic keratoses 12/31/2013   Seizures (HCC)    "don't know what kind; last one was ~ 1 yr ago" (04/10/2016)   Sinus bradycardia    Skin ulcer of sacrum, limited to breakdown of skin (HCC) 03/08/2022   Spondylolisthesis at L3-L4 level 06/12/2022   Stroke (HCC) 1980s   denies residual on 04/10/2016   Thrush 09/19/2013   Type 2 diabetes mellitus (HCC) 10/18/2010    PSH:   Past Surgical History:  Procedure Laterality Date   CESAREAN SECTION  1997   COLONOSCOPY WITH PROPOFOL N/A 04/01/2021   Procedure: COLONOSCOPY WITH PROPOFOL;  Surgeon: Jeani Hawking, MD;  Location: [facility] ENDOSCOPY;  Service: Gastroenterology;  Laterality: N/A;  Rectal bleeding with drop in hemoglobin to 7.2 g/dL   HERNIA REPAIR     IR CHOLANGIOGRAM EXISTING TUBE  07/20/2016   IR PERC CHOLECYSTOSTOMY  05/10/2016   IR RADIOLOGIST EVAL & MGMT  06/08/2016   IR RADIOLOGIST EVAL & MGMT  06/29/2016   IR SINUS/FIST TUBE CHK-NON GI  07/12/2016   RIGHT/LEFT HEART CATH AND CORONARY ANGIOGRAPHY N/A 06/19/2017   Procedure: RIGHT/LEFT HEART CATH AND CORONARY ANGIOGRAPHY;  Surgeon: Dolores Patty, MD;  Location: [facility] INVASIVE CV LAB;  Service: Cardiovascular;  Laterality: N/A;   TIBIA IM NAIL INSERTION Right 07/12/2016   Procedure: INTRAMEDULLARY (IM) NAIL RIGHT TIBIA;  Surgeon: Tarry Kos, MD;  Location: [facility] OR;  Service: Orthopedics;  Laterality: Right;   UMBILICAL HERNIA REPAIR  ~ 1963   "that's why I don't have a belly button"   VAGINAL HYSTERECTOMY      Allergies:  Allergies  Allergen Reactions   Asa [Aspirin] Other (See Comments)    GI bleeds   Hydrocodone Shortness Of Breath   Latuda [Lurasidone Hcl] Anaphylaxis   Magnesium-Containing Compounds Anaphylaxis and Other  (See Comments)    Tolerated Ensure   Prednisone Anaphylaxis, Swelling and Other (See Comments)    Swelling of tongue, lips, throat   Ultram [Tramadol] Anaphylaxis, Swelling and Rash   Codeine Nausea And Vomiting and Rash   Desyrel [Trazodone] Other (See Comments)    Paranoia    Hydralazine Other (See Comments)    Muscle spasms   Peanut (Diagnostic) Hives and Other (See Comments)    Raw peanuts only  OK to eat peanut butter.  Topamax [Topiramate] Other (See Comments)    Paranoia   Sulfa Antibiotics Itching   Tape Rash   Zetia [Ezetimibe] Itching and Rash    Medications:   Prior to Admission medications   Medication Sig Start Date End Date Taking? Authorizing Provider  albuterol (VENTOLIN HFA) 108 (90 Base) MCG/ACT inhaler Inhale 2 puffs into the lungs every 6 (six) hours as needed for wheezing or shortness of breath. 01/05/22  Yes Tiffany Kocher, DO  amLODipine (NORVASC) 5 MG tablet Take 5 mg by mouth daily.   Yes [provider]  atorvastatin (LIPITOR) 20 MG tablet Take 1 tablet (20 mg total) by mouth daily. 04/03/22  Yes Tiffany Kocher, DO  carvedilol (COREG) 12.5 MG tablet Take 1 tablet (12.5 mg total) by mouth 2 (two) times daily. 03/07/22  Yes Pricilla Riffle, MD  cyclobenzaprine (FLEXERIL) 10 MG tablet Take 10 mg by mouth 3 (three) times daily.   Yes [provider]  dicyclomine (BENTYL) 10 MG capsule Take 10 mg by mouth 3 (three) times daily.   Yes [provider]  DULoxetine (CYMBALTA) 30 MG capsule Take 30 mg by mouth daily.   Yes [provider]  escitalopram (LEXAPRO) 20 MG tablet TAKE 1 TABLET(20 MG) BY MOUTH DAILY Patient taking differently: Take 20 mg by mouth daily. 06/21/22  Yes Tiffany Kocher, DO  ferrous sulfate 325 (65 FE) MG tablet Take 1 tablet (325 mg total) by mouth every other day. 06/13/22 09/11/22 Yes Dameron, Nolberto Hanlon, DO  Fluticasone-Umeclidin-Vilant (TRELEGY ELLIPTA) 100-62.5-25 MCG/ACT AEPB INHALE 1 PUFF INTO THE LUNGS DAILY  03/20/22  Yes Tiffany Kocher, DO  folic acid (FOLVITE) 1 MG tablet Take 1 tablet (1 mg total) by mouth daily. 06/09/22 09/07/22 Yes Dahal, Melina Schools, MD  lamoTRIgine (LAMICTAL) 100 MG tablet Take 100 mg by mouth 3 (three) times daily.   Yes [provider]  loperamide (IMODIUM) 2 MG capsule Take 2 mg by mouth 2 (two) times daily.   Yes [provider]  OLANZapine (ZYPREXA) 10 MG tablet Take 10 mg by mouth at bedtime.   Yes [provider]  omeprazole (PRILOSEC) 40 MG capsule Take 1 capsule (40 mg total) by mouth every morning. Patient taking differently: Take 40 mg by mouth daily before breakfast. 04/03/22  Yes Tiffany Kocher, DO  oxyCODONE-acetaminophen (PERCOCET/ROXICET) 5-325 MG tablet Take 1 tablet by mouth every 4 (four) hours as needed for severe pain.   Yes [provider]  potassium chloride SA (KLOR-CON M) 20 MEQ tablet Take 20 mEq by mouth daily.   Yes [provider]  prazosin (MINIPRESS) 1 MG capsule Take 2 capsules (2 mg total) by mouth at bedtime. 06/13/22  Yes Dameron, Nolberto Hanlon, DO  pregabalin (LYRICA) 100 MG capsule Take 100 mg by mouth 2 (two) times daily.   Yes [provider]  SAPHRIS 5 MG SUBL 24 hr tablet Place 5 mg under the tongue 2 (two) times daily.   Yes [provider]  torsemide (DEMADEX) 20 MG tablet Take 1 tablet (20 mg total) by mouth 2 (two) times daily. 06/13/22  Yes Dameron, Nolberto Hanlon, DO    Discontinued Meds:   Medications Discontinued During This Encounter  Medication Reason   amLODipine (NORVASC) 10 MG tablet Dose change   Multiple Vitamins-Minerals (CENTRUM SILVER 50+WOMEN) TABS Patient Preference   albuterol (PROVENTIL) (2.5 MG/3ML) 0.083% nebulizer solution 2.5 mg    cyanocobalamin 1000 MCG tablet    escitalopram (LEXAPRO) 10 MG tablet    lamoTRIgine (LAMICTAL) 25 MG tablet  pregabalin (LYRICA) 75 MG capsule    oxyCODONE (Oxy IR/ROXICODONE) immediate release tablet 5 mg    furosemide (LASIX)  injection 80 mg    albuterol (VENTOLIN HFA) 108 (90 Base) MCG/ACT inhaler 2 puff Duplicate    Social History:  reports that she quit smoking about 6 years ago. Her smoking use included cigarettes. She started smoking about 45 years ago. She has a 57.00 pack-year smoking history. She has been exposed to tobacco smoke. She has never used smokeless tobacco. She reports that she does not currently use drugs after having used the following drugs: Cocaine. She reports that she does not drink alcohol.  Family History:   Family History  Problem Relation Age of Onset   Cancer Mother        lung   Depression Mother    Cancer Father        prostate   Depression Sister    Anxiety disorder Sister    Schizophrenia Sister    Bipolar disorder Sister    Depression Sister    Depression Brother    Heart failure Other        cousin    Blood pressure (!) 154/85, pulse 66, temperature 98.7 F (37.1 C), temperature source Oral, resp. rate 16, height 5\' 4"  (1.626 m), weight 93.8 kg, SpO2 98 %. General: Chronically ill-appearing person  HEENT: NCAT Eyes: Vision grossly intact. Heart: Regular rate, rhythm. No murmurs. Warm extremities.  Lungs: Mild rales bilateral bases. Mild end-expiratory wheezing  Abdomen: SNDNT+BS Extremities: Pitting edema to hips bilaterally.  Skin: Warm, dry. No rashes, lesions, petechiae appreciated.  Neuro: Awake, alert, conversing appropriately. Grossly non-focal.        Ethelene Hal, MD 06/23/2022, 5:15 PM

## 2022-06-23 NOTE — Progress Notes (Signed)
Daily Progress Note   Patient Name: Tiffany [name]       Date: 06/23/2022 DOB: Jan 22, 1960  Age: 62 y.o. MRN#: [id number] Attending Physician: Ollen Bowl, MD Primary Care Physician: Tiffany Kocher, DO Admit Date: 06/21/2022  Reason for Consultation/Follow-up: {Reason for Consult:23484}  Subjective: ***  Length of Stay: 2  Current Medications: Scheduled Meds:   amLODipine  10 mg Oral Daily   asenapine  5 mg Sublingual BID   atorvastatin  20 mg Oral Daily   carvedilol  12.5 mg Oral BID   escitalopram  20 mg Oral Daily   ferrous sulfate  325 mg Oral QODAY   folic acid  1 mg Oral Daily   furosemide  80 mg Intravenous Q8H   heparin  5,000 Units Subcutaneous Q8H   lamoTRIgine  100 mg Oral Daily   OLANZapine  10 mg Oral QHS   pantoprazole  40 mg Oral Daily   prazosin  2 mg Oral QHS   senna  1 tablet Oral BID    Continuous Infusions:   PRN Meds: acetaminophen **OR** acetaminophen, albuterol, hydrALAZINE, polyethylene glycol  Physical Exam          Vital Signs: BP (!) 154/85 (BP Location: Right Arm)   Pulse 66   Temp 98.7 F (37.1 C) (Oral)   Resp 16   Ht 5\' 4"  (1.626 m)   Wt 93.8 kg   SpO2 98%   BMI 35.50 kg/m  SpO2: SpO2: 98 % O2 Device: O2 Device: Room Air O2 Flow Rate:    Intake/output summary:  Intake/Output Summary (Last 24 hours) at 06/23/2022 1657 Last data filed at 06/23/2022 1308 Gross per 24 hour  Intake 240 ml  Output 3850 ml  Net -3610 ml   LBM: Last BM Date : 06/22/22 Baseline Weight: Weight: 93.8 kg Most recent weight: Weight: 93.8 kg       Palliative Assessment/Data:      Patient Active Problem List   Diagnosis Date Noted   AKI (acute kidney injury) (HCC) 06/21/2022   Spondylolisthesis at L3-L4 level 06/12/2022   Facet arthropathy, lumbar  06/12/2022   Pelvic floor weakness in female 06/12/2022   Lower extremity weakness 06/11/2022   Acute on chronic diastolic heart failure (HCC) 06/10/2022   Hip pain 06/10/2022   Urinary incontinence 03/08/2022   Incontinence of feces 03/08/2022   Neck pain 02/28/2022   Dyspnea 02/03/2022   Subacute osteomyelitis of lumbar spine (HCC) 09/27/2021   Bipolar affective disorder, currently depressed, mild (HCC) 09/23/2021   History of falling 07/01/2021   Anemia of renal disease 06/16/2021   Anxiety and depression 05/25/2021   Physical deconditioning 05/25/2021   Cauda equina compression (HCC) 04/24/2021   Severe Spinal stenosis, lumbar 04/24/2021   Hyperlipidemia 03/31/2021   Coronary artery disease involving native coronary artery of native heart without angina pectoris 01/16/2021   Anxiety disorder, unspecified 01/16/2021   GERD (gastroesophageal reflux disease)    Polypharmacy 11/17/2020   Schizophrenia (HCC) 02/20/2020   Hypersomnia, Chronic    Adnexal cyst, right 01/02/2020   Genital herpes 11/25/2019   Family discord 02/04/2019   PTSD (post-traumatic stress disorder) 05/27/2018   Schizoaffective disorder, bipolar type (HCC) 04/05/2018   Frequent  falls 10/11/2017   Dependence on continuous supplemental oxygen 05/14/2017   Gout 04/11/2017   Chronic kidney disease (CKD), stage IV (severe) (HCC) 12/15/2016   History of Anoxic brain injury (HCC) 09/08/2016   Overactive bladder 06/07/2016   OSA and COPD overlap syndrome (HCC)    Osteoarthritis, multiple sites    Acute renal failure superimposed on stage 4 chronic kidney disease (HCC) 11/24/2015   Tobacco use disorder 07/22/2014   Seizure (HCC) 01/04/2013   Chronic low back pain with sciatica 06/18/2012   Morbid obesity (HCC) 10/18/2010    Palliative Care Assessment & Plan   HPI: ***  Assessment: ***  Recommendations/Plan: ***  Goals of Care and Additional Recommendations: Limitations on Scope of Treatment:  {Recommended Scope and Preferences:21019}  Code Status: {Palliative Code status:23503}  Prognosis:  {Palliative Care Prognosis:23504}  Discharge Planning: {Palliative dispostion:23505}  Care plan was discussed with ***  Thank you for allowing the Palliative Medicine Team to assist in the care of this patient.   Time In: *** Time Out: *** Total Time *** Prolonged Time Billed  {YES NO:22349}       Greater than 50%  of this time was spent counseling and coordinating care related to the above assessment and plan.  *Please note that this is a verbal dictation therefore any spelling or grammatical errors are due to the "[employer] One" system interpretation.  Gerlean Ren, DNP, [facility] Palliative Medicine Team Team Phone # [phone]  Pager [phone]

## 2022-06-23 NOTE — Plan of Care (Signed)

## 2022-06-23 NOTE — Progress Notes (Signed)
PROGRESS NOTE    Tiffany [name]  ZOX:[id number] DOB: 20-Aug-1960 DOA: 06/21/2022 PCP: Tiffany Kocher, DO   Brief Narrative:   [name] is a 62 y.o. female with PMH significant for obesity, OSA, DM2, HTN, HLD, CAD, CHF, bilateral lower extremity edema on diuretics, CKD 5, carotid artery disease, h/o cardiac arrest and subsequent anoxic brain injury, history of drug abuse, COPD, anxiety/depression, schizoaffective disorder, GERD, severe spinal stenosis, multiple falls Patient lives alone, has impaired mobility, uses a motorized scooter. She follows up with nephrologist at [facility]   Patient has been hospitalized twice in last 1 month. 5/17-5/23, she was admitted to [facility] service after a fall, noted to have acute worsening of creatinine.  Nephrology was involved.  Creatinine stabilized after diuretic dose adjustment and patient was discharged home.  Patient was on multiple mood altering medications which were reduced at discharge. 5/25-5/28, she was admitted to family medicine service with bilateral hip pain, volume overload, altered mental status.  She has diuresed and discharged home with home health with a creatinine of 4.58.   Today 6/5, patient was brought to the ED from home with unwitnessed fall.  Patient home health nurse found her in the floor, she was unable to tell how long she was on the floor for.     In the ED, patient was afebrile, heart rate in 60s, blood pressure in 140s, breathing on room air Labs with WBC count 5, hemoglobin 8.6, creatinine 5.44 compared to 4.58 at discharge last week. CT head and CT cervical spine did not show any acute injury Chest x-ray showed cardiomegaly with mild pulm edema Hospitalist service was consulted for inpatient admission and management.    Assessment & Plan:   Acute kidney injury on chronic kidney disease stage V  volume overload: -Overall her renal function is worse over the last 6 months. She chronically uses diuretics for  lower extremity edema -Last seen by nephrologist Dr. Juel Burrow 5/21 in the hospital.  Renal biopsy was not considered at that time because patient interested in palliative approach only and did not want to go to dialysis -Presented with a creatinine of 5.44. -has worsening bilateral lower extremity edema and chest x-ray with cardiomegaly and pulmonary vessel congestion.Patient started on high-dose diuretics on 06/22/2022 due to concern for fluid overload. -Palliative on board.  Appreciate help -Nephrology consulted   Schizoaffective disorder Chronic hypersomnia Polypharmacy Depression with anxiety -Continue on Lamictal, olanzapine, prazosin, Lexapro -Avoid narcotics.   lumbar spinal stenosis peripheral neuropathy chronic L3-L4 osteomyelitis Wheelchair-bound status: -She has been deemed not a good candidate for surgical intervention. -PT OT evaluation.  Patient does not want surgery. -History of frequent falls   Anemia of chronic renal disease -Noted hemoglobin stable 8-9. -Likely in the setting of CKD.  Continue to monitor.   CAD/hyperlipidemia: -Continue Coreg and Lipitor.  Cocaine abuse: -Last UDS positive for cocaine.  Recommend cessation  GERD: Continue PPI  Hypertension: Continue amlodipine, Coreg, hydralazine as needed   Stage II sacral decubitus ulcer RN Pressure Injury Documentation:     Pressure Injury Sacrum Mid Stage 2 -  Partial thickness loss of dermis presenting as a shallow open injury with a red, pink wound bed without slough. (Active)     Location: Sacrum  Location Orientation: Mid  Staging: Stage 2 -  Partial thickness loss of dermis presenting as a shallow open injury with a red, pink wound bed without slough.  Wound Description (Comments):   Present on Admission:   Dressing Type None  06/21/22 2308     Pressure Injury 06/21/22 Buttocks Stage 2 -  Partial thickness loss of dermis presenting as a shallow open injury with a red, pink wound bed without slough.  healing stage brown and blaack (Active)  06/21/22 2326  Location: Buttocks  Location Orientation:   Staging: Stage 2 -  Partial thickness loss of dermis presenting as a shallow open injury with a red, pink wound bed without slough.  Wound Description (Comments): healing stage brown and blaack  Present on Admission:     DVT prophylaxis: Heparin Code Status: Full code Family Communication:  None present at bedside.  Plan of care discussed with patient in length and she verbalized understanding and agreed with it.  Patient asked me to not call any of her family member including her daughter to discuss about her medical problems.  Tells me that she can make her medical decisions on her own.  Disposition Plan: To be determined  Consultants:  Nephrology  Procedures:  None  Antimicrobials:  None  Status is: Inpatient    Subjective: Patient seen and examined.  Resting comfortably on the bed.  Denies any complaints.  Initially she refused dialysis early in the morning however later I got the message from the nurse that she wanted to talk to me in person.  I went again and discussed about it.  She wanted to talk to nephrology about dialysis and told me that I should not talk to her daughter on the phone.   Objective: Vitals:   06/22/22 2018 06/23/22 0531 06/23/22 0906 06/23/22 1026  BP: (!) 145/82 121/67 (!) 145/74 110/64  Pulse: 79 (!) 58  66  Resp: 20 20  16   Temp: 98.4 F (36.9 C) 98.2 F (36.8 C)  98.2 F (36.8 C)  TempSrc: Oral Oral  Oral  SpO2: 95% 97%  96%  Weight:      Height:        Intake/Output Summary (Last 24 hours) at 06/23/2022 1536 Last data filed at 06/23/2022 1308 Gross per 24 hour  Intake 240 ml  Output 3850 ml  Net -3610 ml   Filed Weights   06/21/22 1559  Weight: 93.8 kg    Examination:  General exam: Appears calm and comfortable, on room air, communicating well Respiratory system: Clear to auscultation. Respiratory effort  normal. Cardiovascular system: S1 & S2 heard, RRR. No JVD, murmurs, rubs, gallops or clicks.  Bilateral 2+ pitting edema positive. Gastrointestinal system: Abdomen is nondistended, soft and nontender. No organomegaly or masses felt. Normal bowel sounds heard. Central nervous system: Alert and oriented. No focal neurological deficits. Extremities: Symmetric 5 x 5 power. Skin: No rashes, lesions or ulcers Psychiatry: Judgement and insight appear normal. Mood & affect appropriate.    Data Reviewed: I have personally reviewed following labs and imaging studies  CBC: Recent Labs  Lab 06/21/22 1636 06/22/22 0439  WBC 5.0 3.6*  HGB 8.6* 7.8*  HCT 28.8* 26.4*  MCV 92.0 92.3  PLT 236 198   Basic Metabolic Panel: Recent Labs  Lab 06/21/22 1636 06/22/22 0439 06/23/22 0703  NA 137 139 141  K 4.9 4.1 3.5  CL 105 109 105  CO2 21* 20* 22  GLUCOSE 113* 90 96  BUN 107* 108* 103*  CREATININE 5.44* 5.48* 5.52*  CALCIUM 8.9 8.4* 9.0   GFR: Estimated Creatinine Clearance: 11.6 mL/min (A) (by C-G formula based on SCr of 5.52 mg/dL (H)). Liver Function Tests: Recent Labs  Lab 06/21/22 1636  AST 23  ALT  12  ALKPHOS 138*  BILITOT 0.4  PROT 7.2  ALBUMIN 3.8   No results for input(s): "LIPASE", "AMYLASE" in the last 168 hours. No results for input(s): "AMMONIA" in the last 168 hours. Coagulation Profile: No results for input(s): "INR", "PROTIME" in the last 168 hours. Cardiac Enzymes: Recent Labs  Lab 06/21/22 1636  CKTOTAL 623*   BNP (last 3 results) Recent Labs    05/05/22 1315 05/19/22 1408  PROBNP 1,007* 2,269*   HbA1C: No results for input(s): "HGBA1C" in the last 72 hours. CBG: No results for input(s): "GLUCAP" in the last 168 hours. Lipid Profile: No results for input(s): "CHOL", "HDL", "LDLCALC", "TRIG", "CHOLHDL", "LDLDIRECT" in the last 72 hours. Thyroid Function Tests: No results for input(s): "TSH", "T4TOTAL", "FREET4", "T3FREE", "THYROIDAB" in the last 72  hours. Anemia Panel: No results for input(s): "VITAMINB12", "FOLATE", "FERRITIN", "TIBC", "IRON", "RETICCTPCT" in the last 72 hours. Sepsis Labs: No results for input(s): "PROCALCITON", "LATICACIDVEN" in the last 168 hours.  No results found for this or any previous visit (from the past 240 hour(s)).    Radiology Studies: DG Chest 2 View  Result Date: 06/21/2022 CLINICAL DATA:  fall, cp EXAM: CHEST - 2 VIEW COMPARISON:  CXR 06/09/22 FINDINGS: Cardiomegaly. No pleural effusion. No pneumothorax. Findings of mild pulmonary edema. Chronic left-sided rib fractures left seventh and eighth ribs. No radiographically apparent new displaced rib fracture. Visualized upper abdomen is unremarkable. Vertebral body heights are maintained. IMPRESSION: Cardiomegaly with mild pulmonary edema. Electronically Signed   By: Lorenza Cambridge M.D.   On: 06/21/2022 18:04   CT HEAD WO CONTRAST ( )  Result Date: 06/21/2022 CLINICAL DATA:  Head trauma, moderate-severe fall, pt poor historian uncertain of head injury and some baseline AMS present; Neck trauma, midline tenderness (Age 32-64y) EXAM: CT HEAD WITHOUT CONTRAST CT CERVICAL SPINE WITHOUT CONTRAST TECHNIQUE: Multidetector CT imaging of the head and cervical spine was performed following the standard protocol without intravenous contrast. Multiplanar CT image reconstructions of the cervical spine were also generated. RADIATION DOSE REDUCTION: This exam was performed according to the departmental dose-optimization program which includes automated exposure control, adjustment of the mA and/or kV according to patient size and/or use of iterative reconstruction technique. COMPARISON:  CT Head 07/09/21 FINDINGS: CT HEAD FINDINGS Brain: No evidence of acute infarction, hemorrhage, hydrocephalus, extra-axial collection or mass lesion/mass effect. Sequela of mild chronic microvascular ischemic change. Vascular: No hyperdense vessel or unexpected calcification. Skull: Normal. Negative  for fracture or focal lesion. Sinuses/Orbits: Small bilateral mastoid effusions. No middle ear effusion. Paranasal sinuses are clear. Unremarkable. Other: None. CT CERVICAL SPINE FINDINGS Alignment: There is straightening of the normal cervical lordosis. Skull base and vertebrae: No acute fracture. No primary bone lesion or focal pathologic process. Soft tissues and spinal canal: No prevertebral fluid or swelling. No visible canal hematoma. Disc levels:  No evidence of high-grade canal stenosis. Upper chest: Negative. Other: None IMPRESSION: 1. No acute intracranial abnormality. 2. No acute fracture or traumatic subluxation of the cervical spine. 3. Small bilateral mastoid effusions. Electronically Signed   By: Lorenza Cambridge M.D.   On: 06/21/2022 18:01   CT Cervical Spine Wo Contrast  Result Date: 06/21/2022 CLINICAL DATA:  Head trauma, moderate-severe fall, pt poor historian uncertain of head injury and some baseline AMS present; Neck trauma, midline tenderness (Age 79-64y) EXAM: CT HEAD WITHOUT CONTRAST CT CERVICAL SPINE WITHOUT CONTRAST TECHNIQUE: Multidetector CT imaging of the head and cervical spine was performed following the standard protocol without intravenous contrast. Multiplanar CT image  reconstructions of the cervical spine were also generated. RADIATION DOSE REDUCTION: This exam was performed according to the departmental dose-optimization program which includes automated exposure control, adjustment of the mA and/or kV according to patient size and/or use of iterative reconstruction technique. COMPARISON:  CT Head 07/09/21 FINDINGS: CT HEAD FINDINGS Brain: No evidence of acute infarction, hemorrhage, hydrocephalus, extra-axial collection or mass lesion/mass effect. Sequela of mild chronic microvascular ischemic change. Vascular: No hyperdense vessel or unexpected calcification. Skull: Normal. Negative for fracture or focal lesion. Sinuses/Orbits: Small bilateral mastoid effusions. No middle ear  effusion. Paranasal sinuses are clear. Unremarkable. Other: None. CT CERVICAL SPINE FINDINGS Alignment: There is straightening of the normal cervical lordosis. Skull base and vertebrae: No acute fracture. No primary bone lesion or focal pathologic process. Soft tissues and spinal canal: No prevertebral fluid or swelling. No visible canal hematoma. Disc levels:  No evidence of high-grade canal stenosis. Upper chest: Negative. Other: None IMPRESSION: 1. No acute intracranial abnormality. 2. No acute fracture or traumatic subluxation of the cervical spine. 3. Small bilateral mastoid effusions. Electronically Signed   By: Lorenza Cambridge M.D.   On: 06/21/2022 18:01    Scheduled Meds:  amLODipine  10 mg Oral Daily   asenapine  5 mg Sublingual BID   atorvastatin  20 mg Oral Daily   carvedilol  12.5 mg Oral BID   escitalopram  20 mg Oral Daily   ferrous sulfate  325 mg Oral QODAY   folic acid  1 mg Oral Daily   furosemide  80 mg Intravenous Q8H   heparin  5,000 Units Subcutaneous Q8H   lamoTRIgine  100 mg Oral Daily   OLANZapine  10 mg Oral QHS   pantoprazole  40 mg Oral Daily   prazosin  2 mg Oral QHS   senna  1 tablet Oral BID   Continuous Infusions:   LOS: 2 days   Time spent: 45 minutes   Linzy Darling Estill Cotta, MD [facility]  If 7PM-7AM, please contact night-coverage [email or web address] 06/23/2022, 3:36 PM

## 2022-06-23 NOTE — Care Management Important Message (Signed)
Important Message  Patient Details  Name: Tiffany [name] MRN: [id number] Date of Birth: [birth year]   Medicare Important Message Given:  Yes     [name] Stefan Church 06/23/2022, 3:50 PM

## 2022-06-24 DIAGNOSIS — N189 Chronic kidney disease, unspecified: Secondary | ICD-10-CM | POA: Diagnosis not present

## 2022-06-24 DIAGNOSIS — N179 Acute kidney failure, unspecified: Secondary | ICD-10-CM | POA: Diagnosis not present

## 2022-06-24 LAB — BASIC METABOLIC PANEL
Anion gap: 14 (ref 5–15)
BUN: 95 mg/dL — ABNORMAL HIGH (ref 8–23)
CO2: 25 mmol/L (ref 22–32)
Calcium: 9.2 mg/dL (ref 8.9–10.3)
Chloride: 103 mmol/L (ref 98–111)
Creatinine, Ser: 5.15 mg/dL — ABNORMAL HIGH (ref 0.44–1.00)
GFR, Estimated: 9 mL/min — ABNORMAL LOW (ref >60)
Glucose, Bld: 121 mg/dL — ABNORMAL HIGH (ref 70–99)
Potassium: 3.2 mmol/L — ABNORMAL LOW (ref 3.5–5.1)
Sodium: 142 mmol/L (ref 135–145)

## 2022-06-24 LAB — CBC
HCT: 31.1 % — ABNORMAL LOW (ref 36.0–46.0)
Hemoglobin: 9.3 g/dL — ABNORMAL LOW (ref 12.0–15.0)
MCH: 26.7 pg (ref 26.0–34.0)
MCHC: 29.9 g/dL — ABNORMAL LOW (ref 30.0–36.0)
MCV: 89.4 fL (ref 80.0–100.0)
Platelets: 224 10*3/uL (ref 150–400)
RBC: 3.48 MIL/uL — ABNORMAL LOW (ref 3.87–5.11)
RDW: 17.8 % — ABNORMAL HIGH (ref 11.5–15.5)
WBC: 4.4 10*3/uL (ref 4.0–10.5)
nRBC: 0 % (ref 0.0–0.2)

## 2022-06-24 MED ORDER — TORSEMIDE 40 MG PO TABS: 40.0000 mg | ORAL_TABLET | Freq: Two times a day (BID) | ORAL | 0 refills | Status: DC

## 2022-06-24 MED ORDER — OXYCODONE-ACETAMINOPHEN 5-325 MG PO TABS: 1.0000 | ORAL_TABLET | Freq: Every day | ORAL | 0 refills | Status: DC | PRN

## 2022-06-24 NOTE — Plan of Care (Signed)
  Problem: Education: Goal: Knowledge of General Education information will improve Description: Including pain rating scale, medication(s)/side effects and non-pharmacologic comfort measures Outcome: Progressing Pt is aware she was admitted d/t falls and AKI.    Problem: Clinical Measurements: Goal: Ability to maintain a body temperature in the normal range will improve Outcome: Progressing Temperature WNL thus.    Problem: Clinical Measurements: Goal: Will remain free from infection Outcome: Progressing S/Sx of infection monitored and assess q-shift.  Pt has remained afebrile thus far.     Problem: Respiratory: Goal: Ability to maintain adequate ventilation will improve Outcome: Progressing Respiratory status monitor and assessed q-shift.  Pt is on room air with O2 saturations at 99-100% and respirations at 16-18 breaths per minute.  Instructed pt to cough and deep breathe to promote respiratory status.  Pt has not endorsed c/o SOB or DOE.  Problem: Fluid Volume: Goal: Ability to maintain a balanced intake and output will improve Outcome: Progressing Pt is able to tolerate PO.  Pt requested lemon-lime shasta this shift.  She was able to tolerate PO fluids.     Problem: Nutritional: Goal: Maintenance of adequate nutrition will improve Outcome: Progressing Pt is able to tolerate her diet.  No s/sx of n/v thus far.   Problem: Skin Integrity: Goal: Risk for impaired skin integrity will decrease Outcome: Progressing Skin integrity monitor and assessed q-shift.  Instructed pt to turn/ reposition herself to prevent further skin impairment.  Tubes and drains assessed for device related pressure sores.  Pt is incontinent of both her bowel and bladder.  She has a purewick in place per MD's orders to capture her urine.  Management of purewick administered per [facility] policy, guidelines and procedures.  Pt checked q2 hours for incontinence.  Perineal care given promptly after each episode.   Dressing changes performed per MD's orders.    Problem: Safety: Goal: Ability to remain free from injury will improve Outcome: Progressing Pt has remained free from falls thus far.  Instructed pt to utilize RN call light for assistance.  Hourly rounds performed.  Bed in lowest position, locked with two upper side rails engaged.  Belongings and call light within reach.

## 2022-06-24 NOTE — Progress Notes (Signed)
Daily Progress Note   Patient Name: Tiffany [name]       Date: 06/24/2022 DOB: [birth year]  Age: 62 y.o. MRN#: [id number] Attending Physician: Ollen Bowl, MD Primary Care Physician: Tiffany Kocher, DO Admit Date: 06/21/2022  Reason for Consultation/Follow-up: Establishing goals of care  Subjective: No complaints today  Length of Stay: 3  Current Medications: Scheduled Meds:   amLODipine  10 mg Oral Daily   asenapine  5 mg Sublingual BID   atorvastatin  20 mg Oral Daily   carvedilol  12.5 mg Oral BID   escitalopram  20 mg Oral Daily   ferrous sulfate  325 mg Oral QODAY   folic acid  1 mg Oral Daily   furosemide  80 mg Intravenous Q8H   heparin  5,000 Units Subcutaneous Q8H   lamoTRIgine  100 mg Oral Daily   OLANZapine  10 mg Oral QHS   pantoprazole  40 mg Oral Daily   prazosin  2 mg Oral QHS   pregabalin  100 mg Oral BID   senna  1 tablet Oral BID    Continuous Infusions:   PRN Meds: acetaminophen **OR** acetaminophen, albuterol, hydrALAZINE, polyethylene glycol  Physical Exam Constitutional:      General: [name] not in acute distress.    Appearance: [name] ill-appearing.  Pulmonary:     Effort: Pulmonary effort is normal.  Skin:    General: Skin is warm and dry.  Neurological:     Mental Status: [name] alert and oriented to person, place, and time.  Psychiatric:        Mood and Affect: Mood normal.        Behavior: Behavior normal.             Vital Signs: BP (!) 166/86 (BP Location: Right Arm)   Pulse 62   Temp 98.1 F (36.7 C) (Oral)   Resp 16   Ht 5\' 4"  (1.626 m)   Wt 93.8 kg   SpO2 99%   BMI 35.50 kg/m  SpO2: SpO2: 99 % O2 Device: O2 Device: Room Air O2 Flow Rate:    Intake/output summary:  Intake/Output Summary (Last 24 hours) at 06/24/2022 1034 Last  data filed at 06/24/2022 0422 Gross per 24 hour  Intake --  Output 3100 ml  Net -3100 ml    LBM: Last BM Date : 06/23/22 Baseline Weight: Weight: 93.8 kg Most recent weight: Weight: 93.8 kg       Palliative Assessment/Data: PPS 40%      Patient Active Problem List   Diagnosis Date Noted   AKI (acute kidney injury) (HCC) 06/21/2022   Spondylolisthesis at L3-L4 level 06/12/2022   Facet arthropathy, lumbar 06/12/2022   Pelvic floor weakness in female 06/12/2022   Lower extremity weakness 06/11/2022   Acute on chronic diastolic heart failure (HCC) 06/10/2022   Hip pain 06/10/2022   Urinary incontinence 03/08/2022   Incontinence of feces 03/08/2022   Neck pain 02/28/2022   Dyspnea 02/03/2022   Subacute osteomyelitis of lumbar spine (HCC) 09/27/2021   Bipolar affective disorder, currently depressed, mild (HCC) 09/23/2021   History of falling 07/01/2021   Anemia of renal disease 06/16/2021   Anxiety and depression 05/25/2021  Physical deconditioning 05/25/2021   Cauda equina compression (HCC) 04/24/2021   Severe Spinal stenosis, lumbar 04/24/2021   Hyperlipidemia 03/31/2021   Coronary artery disease involving native coronary artery of native heart without angina pectoris 01/16/2021   Anxiety disorder, unspecified 01/16/2021   GERD (gastroesophageal reflux disease)    Polypharmacy 11/17/2020   Schizophrenia (HCC) 02/20/2020   Hypersomnia, Chronic    Adnexal cyst, right 01/02/2020   Genital herpes 11/25/2019   Family discord 02/04/2019   PTSD (post-traumatic stress disorder) 05/27/2018   Schizoaffective disorder, bipolar type (HCC) 04/05/2018   Frequent falls 10/11/2017   Dependence on continuous supplemental oxygen 05/14/2017   Gout 04/11/2017   Chronic kidney disease (CKD), stage IV (severe) (HCC) 12/15/2016   History of Anoxic brain injury (HCC) 09/08/2016   Overactive bladder 06/07/2016   OSA and COPD overlap syndrome (HCC)    Osteoarthritis, multiple sites     Acute renal failure superimposed on stage 4 chronic kidney disease (HCC) 11/24/2015   Tobacco use disorder 07/22/2014   Seizure (HCC) 01/04/2013   Chronic low back pain with sciatica 06/18/2012   Morbid obesity (HCC) 10/18/2010    Palliative Care Assessment & Plan   HPI: 62 y.o. female  with past medical history of type 2 diabetes, hypertension, chronic kidney disease, CAD, cardiac arrest with anoxic brain injury, schizoaffective disorder, COPD, and CHF admitted on 06/21/2022 with a fall.  Found to have elevated creatinine.  Patient with multiple recent admissions.  5 admissions in the last 6 months.  This admission creatinine found to be greater than 5.  Creatinine has steadily risen over the last 6 months.  She also has volume overload this admission and was started on Lasix.  PMT consulted to discuss goals of care.  Assessment: Follow up today with Tiffany [name]. Tiffany [name] tells me about her conversation with nephrology. Tells me she understands concerns about her being able to tolerate HD and concerns about quality of life. She would still like to move forward with HD if needed. She understands this process may be started next week with HD cath placement. Patient is clear she would like to continue aggressive care as she wants to live as long as possible for her granddaughter.  She tells me she still hasn't spoken to her daughter regarding goals of care. Again, she doesn't want me to call daughter today. I did speak with daughter earlier in week - please see note from 6/6 regarding that discussion. I provided patient with daughter's number from chart so that she could call her to discuss goals of care.  Patient interested in support of chaplain.   Recommendations/Plan: Patient interested in pursuing HD if offered Patient plans to have further GOC conversations with daughter, does not want me to call her Chaplain support  Care plan was discussed with patient  Thank you for allowing the Palliative  Medicine Team to assist in the care of this patient.  *Please note that this is a verbal dictation therefore any spelling or grammatical errors are due to the "[employer] One" system interpretation.  Gerlean Ren, DNP, [facility] Palliative Medicine Team Team Phone # [phone]  Pager [phone]

## 2022-06-24 NOTE — Progress Notes (Signed)
Pt upset and stating no one is listening to her. [name] question her lyrica stating why is she  getting it if its causing AKI. Care RN informed her that whom ever ordered it had a reason. [name] upset with her treatment/ care and has refused head to toe assessment.  Secured chatted Pahwani MD and informed her of patient's concern.  MD, Pahwani, stated "I got the message yesterday from a nurse. Patient requested Lyrica since she takes Lyrica 100 mg twice a day at home and she wanted to resume it which I did."

## 2022-06-24 NOTE — Progress Notes (Signed)
[facility] Progress Note   62 y.o. female with CKD, schizoaffective disorder, COPD, morbid obesity, obstructive sleep apnea, type II diabetes mellitus, HTN, HLD, CASHD, h/o cardiac arrest and anoxic brain jury, history of cocaine use (last use ~2 years ago) admitted to [facility] 5/17 with acute on chronic renal failure. Prior to admission patient reports she was feeling generally weak, not eating or drinking at her baseline. Patient had a reported mechanical fall and was found on the ground by her therapist, who called 911. She states she was only on the ground for 10-20 minutes. On arrival sCr 4.73. She was given intravenous fluids with mild improvement of her renal function, this has now stabilized out with sCr 4.13 and nephrology consulted for further assistance   Assessment/ Plan:   Oliguric acute kidney injury in setting of chronic renal disease stage IV: Overall, her renal function has been worsening steadily over the ; of note she was in the 1.2-1.8 range as recently as 09/19/21 and then now consistently in the m2.6-3 range earlier 2024 with progression into the 4's in 05/2022. [name] Patient chronically uses diuretics for her lower extremity edema, although notes as of late the diuretics have not been working as well. Patient last used cocaine roughly three years ago and has not been sexually active in the last six years. Urine sediment on my exam did not show RBC's; regardless she has had fairly rapid decline in renal function -> neg serologies. LK (may not have much function in the RK which is only 7.4 cm in length; therefore no bx and likely has significant chronicity.    Renal function appears to be stabilizing but still concerned she will need dialysis in the near future. If she's d/c I will get her a 1 week f/u with [facility] to facilitate transition to dialysis. If she's still here tomorrow I will ask Dr. Karin Lieu ([facility]) to see whether they can place  an AVG so we can possibly avoid a TC.     I had a length discussion with her about dialysis on 6/7 and she is adamant that she wants to give dialysis a try in order to spend as much time as possible with her granddaughter. Appreciate efforts from palliative.  I explained she's not a great candidate for dialysis given age, comorbidities, functional status, wheelchair bound, OSA, DM in the past, CASHD, h/o cardiac arrest, COPD.     Chronic anemia: Appears to be at baseline, no signs or symptoms of acute bleeding. Likely 2/2 renal disease.  COPD/OSA: On bronchodilators, sating well on room air currently.  Chronic diastolic heart failure: Gave a dose of Lasix on 5/20 and appears she had a very good response. CAD with history of cardiac arrest: Per primary Type II diabetes mellitus: Sugars at goal, last A1c at goal.  Subjective:   Feeling better and thinks her legs look better. Denies f/c/n/v/sob; good appetite.   Objective:   BP (!) 166/86 (BP Location: Right Arm)   Pulse 62   Temp 98.1 F (36.7 C) (Oral)   Resp 16   Ht 5\' 4"  (1.626 m)   Wt 93.8 kg   SpO2 99%   BMI 35.50 kg/m   Intake/Output Summary (Last 24 hours) at 06/24/2022 1309 Last data filed at 06/24/2022 1035 Gross per 24 hour  Intake 480 ml  Output 3400 ml  Net -2920 ml   Weight change:   Physical Exam: General: Chronically ill-appearing person  HEENT: NCAT Eyes: Vision grossly intact. Heart: Regular rate,  rhythm. No murmurs. Warm extremities.  Lungs: Mild rales bilateral bases. Mild end-expiratory wheezing  Abdomen: SNDNT+BS Extremities: Pitting edema to hips bilaterally.  Skin: Warm, dry. No rashes, lesions, petechiae appreciated.  Neuro: Awake, alert, conversing appropriately. Grossly non-focal.   Imaging: No results found.  Labs: BMET Recent Labs  Lab 06/21/22 1636 06/22/22 0439 06/23/22 0703 06/23/22 1919 06/24/22 0443  NA 137 139 141  --  142  K 4.9 4.1 3.5  --  3.2*  CL 105 109 105  --  103  CO2 21* 20* 22  --  25  GLUCOSE 113* 90 96   --  121*  BUN 107* 108* 103*  --  95*  CREATININE 5.44* 5.48* 5.52*  --  5.15*  CALCIUM 8.9 8.4* 9.0  --  9.2  PHOS  --   --   --  5.0*  --    CBC Recent Labs  Lab 06/21/22 1636 06/22/22 0439 06/24/22 0443  WBC 5.0 3.6* 4.4  HGB 8.6* 7.8* 9.3*  HCT 28.8* 26.4* 31.1*  MCV 92.0 92.3 89.4  PLT 236 198 224    Medications:     amLODipine  10 mg Oral Daily   asenapine  5 mg Sublingual BID   atorvastatin  20 mg Oral Daily   carvedilol  12.5 mg Oral BID   escitalopram  20 mg Oral Daily   ferrous sulfate  325 mg Oral QODAY   folic acid  1 mg Oral Daily   furosemide  80 mg Intravenous Q8H   heparin  5,000 Units Subcutaneous Q8H   lamoTRIgine  100 mg Oral Daily   OLANZapine  10 mg Oral QHS   pantoprazole  40 mg Oral Daily   prazosin  2 mg Oral QHS   pregabalin  100 mg Oral BID   senna  1 tablet Oral BID      Paulene Floor, MD 06/24/2022, 1:09 PM

## 2022-06-24 NOTE — Progress Notes (Signed)
PROGRESS NOTE    Tiffany [name]  ZOX:[id number] DOB: 1960-08-25 DOA: 06/21/2022 PCP: Tiffany Kocher, DO   Brief Narrative:   [name] is a 62 y.o. female with PMH significant for obesity, OSA, DM2, HTN, HLD, CAD, CHF, bilateral lower extremity edema on diuretics, CKD 5, carotid artery disease, h/o cardiac arrest and subsequent anoxic brain injury, history of drug abuse, COPD, anxiety/depression, schizoaffective disorder, GERD, severe spinal stenosis, multiple falls Patient lives alone, has impaired mobility, uses a motorized scooter. She follows up with nephrologist at [facility]   Patient has been hospitalized twice in last 1 month. 5/17-5/23, she was admitted to [facility] service after a fall, noted to have acute worsening of creatinine.  Seen by nephrologist Dr. Juel Burrow 5/21 in the hospital.  Renal biopsy was not considered at that time because patient interested in palliative approach only and did not want to go to dialysis. Creatinine stabilized after diuretic dose adjustment and patient was discharged home.  Patient was on multiple mood altering medications which were reduced at discharge. 5/25-5/28, she was admitted to family medicine service with bilateral hip pain, volume overload, altered mental status.  She has diuresed and discharged home with home health with a creatinine of 4.58.   Today 6/5, patient was brought to the ED from home with unwitnessed fall.  Patient home health nurse found her in the floor, she was unable to tell how long she was on the floor for.     In the ED, patient was afebrile, heart rate in 60s, blood pressure in 140s, breathing on room air Labs with WBC count 5, hemoglobin 8.6, creatinine 5.44 compared to 4.58 at discharge last week. CT head and CT cervical spine did not show any acute injury Chest x-ray showed cardiomegaly with mild pulm edema Hospitalist service was consulted for inpatient admission and management.    Assessment & Plan:   Acute  kidney injury on chronic kidney disease stage V  volume overload: -Overall her renal function is worse over the last 6 months. She chronically uses diuretics for lower extremity edema -Presented with a creatinine of 5.44. -has worsening bilateral lower extremity edema and chest x-ray with cardiomegaly and pulmonary vessel congestion.Patient started on high-dose diuretics on 06/22/2022  -Palliative on board.  Appreciate help -Nephrology consulted-recommend to continue diuresis at this time and assess daily for dialysis needs.   Schizoaffective disorder Chronic hypersomnia Polypharmacy Depression with anxiety -Continue on Lamictal, olanzapine, prazosin, Lexapro -Avoid narcotics.   lumbar spinal stenosis peripheral neuropathy chronic L3-L4 osteomyelitis Wheelchair-bound status: -She has been deemed not a good candidate for surgical intervention. -PT OT evaluation.  Patient does not want surgery. -History of frequent falls   Anemia of chronic renal disease -Noted hemoglobin stable 8-9. -Likely in the setting of CKD.  Continue to monitor.   CAD/hyperlipidemia: -Continue Coreg and Lipitor.  Cocaine abuse: -Last UDS positive for cocaine.  Recommend cessation  GERD: Continue PPI  Hypertension: Continue amlodipine, Coreg, hydralazine as needed  Type 2 diabetes: -At goal   Stage II sacral decubitus ulcer RN Pressure Injury Documentation:     Pressure Injury Sacrum Mid Stage 2 -  Partial thickness loss of dermis presenting as a shallow open injury with a red, pink wound bed without slough. (Active)     Location: Sacrum  Location Orientation: Mid  Staging: Stage 2 -  Partial thickness loss of dermis presenting as a shallow open injury with a red, pink wound bed without slough.  Wound Description (Comments):  Present on Admission:   Dressing Type None 06/21/22 2308     Pressure Injury 06/21/22 Buttocks Stage 2 -  Partial thickness loss of dermis presenting as a shallow open  injury with a red, pink wound bed without slough. healing stage brown and blaack (Active)  06/21/22 2326  Location: Buttocks  Location Orientation:   Staging: Stage 2 -  Partial thickness loss of dermis presenting as a shallow open injury with a red, pink wound bed without slough.  Wound Description (Comments): healing stage brown and blaack  Present on Admission:     DVT prophylaxis: Heparin Code Status: Full code Family Communication:  None present at bedside.  Plan of care discussed with patient in length and she verbalized understanding and agreed with it.  Patient asked me to not call any of her family member including her daughter to discuss about her medical problems.  Tells me that she can make her medical decisions on her own.  Disposition Plan: To be determined  Consultants:  Nephrology Palliative care  Procedures:  None  Antimicrobials:  None  Status is: Inpatient    Subjective: Patient seen and examined.  Denies any new complaints.  She wishes to be started on dialysis.  No acute events overnight.  [name] happy that her leg swelling have improved significantly.  Objective: Vitals:   06/23/22 1623 06/23/22 1938 06/24/22 0419 06/24/22 0719  BP: (!) 154/85 (!) 144/81 (!) 145/82 (!) 166/86  Pulse: 66 72 63 62  Resp: 16 18 18 16   Temp: 98.7 F (37.1 C) 98.7 F (37.1 C) 98.6 F (37 C) 98.1 F (36.7 C)  TempSrc: Oral Oral  Oral  SpO2: 98% 99% 100% 99%  Weight:      Height:        Intake/Output Summary (Last 24 hours) at 06/24/2022 1039 Last data filed at 06/24/2022 1035 Gross per 24 hour  Intake --  Output 3800 ml  Net -3800 ml    Filed Weights   06/21/22 1559  Weight: 93.8 kg    Examination:  General exam: Appears calm and comfortable, on room air, communicating well Respiratory system: Clear to auscultation. Respiratory effort normal. Cardiovascular system: S1 & S2 heard, RRR. No JVD, murmurs, rubs, gallops or clicks.  Trace pedal edema.    Gastrointestinal system: Abdomen is nondistended, soft and nontender. No organomegaly or masses felt. Normal bowel sounds heard. Central nervous system: Alert and oriented. No focal neurological deficits. Extremities: Symmetric 5 x 5 power. Skin: No rashes, lesions or ulcers Psychiatry: Judgement and insight appear normal. Mood & affect appropriate.    Data Reviewed: I have personally reviewed following labs and imaging studies  CBC: Recent Labs  Lab 06/21/22 1636 06/22/22 0439 06/24/22 0443  WBC 5.0 3.6* 4.4  HGB 8.6* 7.8* 9.3*  HCT 28.8* 26.4* 31.1*  MCV 92.0 92.3 89.4  PLT 236 198 224    Basic Metabolic Panel: Recent Labs  Lab 06/21/22 1636 06/22/22 0439 06/23/22 0703 06/23/22 1919 06/24/22 0443  NA 137 139 141  --  142  K 4.9 4.1 3.5  --  3.2*  CL 105 109 105  --  103  CO2 21* 20* 22  --  25  GLUCOSE 113* 90 96  --  121*  BUN 107* 108* 103*  --  95*  CREATININE 5.44* 5.48* 5.52*  --  5.15*  CALCIUM 8.9 8.4* 9.0  --  9.2  PHOS  --   --   --  5.0*  --  GFR: Estimated Creatinine Clearance: 12.4 mL/min (A) (by C-G formula based on SCr of 5.15 mg/dL (H)). Liver Function Tests: Recent Labs  Lab 06/21/22 1636  AST 23  ALT 12  ALKPHOS 138*  BILITOT 0.4  PROT 7.2  ALBUMIN 3.8    No results for input(s): "LIPASE", "AMYLASE" in the last 168 hours. No results for input(s): "AMMONIA" in the last 168 hours. Coagulation Profile: No results for input(s): "INR", "PROTIME" in the last 168 hours. Cardiac Enzymes: Recent Labs  Lab 06/21/22 1636  CKTOTAL 623*    BNP (last 3 results) Recent Labs    05/05/22 1315 05/19/22 1408  PROBNP 1,007* 2,269*    HbA1C: No results for input(s): "HGBA1C" in the last 72 hours. CBG: No results for input(s): "GLUCAP" in the last 168 hours. Lipid Profile: No results for input(s): "CHOL", "HDL", "LDLCALC", "TRIG", "CHOLHDL", "LDLDIRECT" in the last 72 hours. Thyroid Function Tests: No results for input(s): "TSH",  "T4TOTAL", "FREET4", "T3FREE", "THYROIDAB" in the last 72 hours. Anemia Panel: Recent Labs    06/23/22 1919  FERRITIN 35  TIBC 386  IRON 47   Sepsis Labs: No results for input(s): "PROCALCITON", "LATICACIDVEN" in the last 168 hours.  No results found for this or any previous visit (from the past 240 hour(s)).    Radiology Studies: No results found.  Scheduled Meds:  amLODipine  10 mg Oral Daily   asenapine  5 mg Sublingual BID   atorvastatin  20 mg Oral Daily   carvedilol  12.5 mg Oral BID   escitalopram  20 mg Oral Daily   ferrous sulfate  325 mg Oral QODAY   folic acid  1 mg Oral Daily   furosemide  80 mg Intravenous Q8H   heparin  5,000 Units Subcutaneous Q8H   lamoTRIgine  100 mg Oral Daily   OLANZapine  10 mg Oral QHS   pantoprazole  40 mg Oral Daily   prazosin  2 mg Oral QHS   pregabalin  100 mg Oral BID   senna  1 tablet Oral BID   Continuous Infusions:   LOS: 3 days   Time spent: 45 minutes   Alizey Noren Estill Cotta, MD [facility]  If 7PM-7AM, please contact night-coverage [email or web address] 06/24/2022, 10:39 AM

## 2022-06-24 NOTE — Discharge Summary (Signed)
Physician Discharge Summary  Tiffany [name] ZOX:[id number] DOB: 10/11/1960 DOA: 06/21/2022  PCP: Tiffany Kocher, DO  Admit date: 06/21/2022 Discharge date: 06/24/2022  Admitted From: Home Disposition:  Home  Recommendations for Outpatient Follow-up:  Follow-up with nephrology Dr. Juel Burrow in 1 week Take torsemide 40 mg twice a day Avoid NSAIDs such as ibuprofen, Motrin, Aleve Compression stockings and low-sodium diet  Home Health: None Equipment/Devices: None Discharge Condition: Stable CODE STATUS: Full code Diet recommendation: Renal diet  Brief/Interim Summary:  TALIN DRYMAN is a 62 y.o. female with PMH significant for obesity, OSA, DM2, HTN, HLD, CAD, CHF, bilateral lower extremity edema on diuretics, CKD 5, carotid artery disease, h/o cardiac arrest and subsequent anoxic brain injury, history of drug abuse, COPD, anxiety/depression, schizoaffective disorder, GERD, severe spinal stenosis, multiple falls Patient lives alone, has impaired mobility, uses a motorized scooter. She follows up with nephrologist at [facility]   Patient has been hospitalized twice in last 1 month. 5/17-5/23, she was admitted to [name] service after a fall, noted to have acute worsening of creatinine.  Seen by nephrologist Dr. Juel Burrow 5/21 in the hospital.  Renal biopsy was not considered at that time because patient interested in palliative approach only and did not want to go to dialysis. Creatinine stabilized after diuretic dose adjustment and patient was discharged home.  Patient was on multiple mood altering medications which were reduced at discharge.  5/25-5/28, she was admitted to family medicine service with bilateral hip pain, volume overload, altered mental status.  She has diuresed and discharged home with home health with a creatinine of 4.58.    6/5, patient was brought to the ED from home with unwitnessed fall.  Patient home health nurse found her in the floor, she was unable to tell how long she was  on the floor for.     In the ED, patient was afebrile, heart rate in 60s, blood pressure in 140s, breathing on room air Labs with WBC count 5, hemoglobin 8.6, creatinine 5.44 compared to 4.58 at discharge last week. CT head and CT cervical spine did not show any acute injury Chest x-ray showed cardiomegaly with mild pulm edema Hospitalist service was consulted for inpatient admission and management.  Acute kidney injury on chronic kidney disease stage V  volume overload: -Overall her renal function is worse over the last 6 months. She chronically uses diuretics for lower extremity edema -Presented with a creatinine of 5.44. -has worsening bilateral lower extremity edema and chest x-ray with cardiomegaly and pulmonary vessel congestion.Patient started on high-dose diuretics on 06/22/2022  -Nephrology consulted-renal function appears to be stabilizing, she will need dialysis in near future.  Recommended follow-up in 1 week outpatient to start transition to dialysis.  Discharge home on torsemide 40 mg twice daily. -Patient is comfortable going home today  Schizoaffective disorder Chronic hypersomnia Polypharmacy Depression with anxiety -Continue on Lamictal, olanzapine, prazosin, Lexapro -Avoid narcotics.   lumbar spinal stenosis peripheral neuropathy chronic L3-L4 osteomyelitis Wheelchair-bound status: -She has been deemed not a good candidate for surgical intervention. - Patient does not want surgery. -History of frequent falls -Has home health services   Anemia of chronic renal disease -Noted hemoglobin stable 8-9. -Likely in the setting of CKD.    CAD/hyperlipidemia: -Continued Coreg and Lipitor.   Cocaine abuse: -Last UDS positive for cocaine.  Recommend cessation   GERD: Continued PPI   Hypertension: Continued amlodipine, Coreg, hydralazine as needed   Type 2 diabetes: -At goal   Stage II sacral decubitus ulcer -  aware   Discharge Diagnoses:  Acute kidney injury  on CKD stage V Volume overload Schizoaffective disorder Chronic hypersomnia Polypharmacy Depression with anxiety Lumbar spinal stenosis Peripheral neuropathy Chronic L3-L4 osteomyelitis Wheelchair-bound status Anemia of chronic disease Coronary artery disease Hyperlipidemia Cocaine abuse Hypertension Type 2 diabetes Stage II sacral decubitus ulcer Morbid obesity with BMI of 35   Discharge Instructions  Discharge Instructions     Ambulatory Referral for Lung Cancer Scre   Complete by: As directed    Increase activity slowly   Complete by: As directed    No wound care   Complete by: As directed       Allergies as of 06/24/2022       Reactions   Asa [aspirin] Other (See Comments)   GI bleeds   Hydrocodone Shortness Of Breath   Latuda [lurasidone Hcl] Anaphylaxis   Magnesium-containing Compounds Anaphylaxis, Other (See Comments)   Tolerated Ensure   Prednisone Anaphylaxis, Swelling, Other (See Comments)   Swelling of tongue, lips, throat   Ultram [tramadol] Anaphylaxis, Swelling, Rash   Codeine Nausea And Vomiting, Rash   Desyrel [trazodone] Other (See Comments)   Paranoia    Hydralazine Other (See Comments)   Muscle spasms   Peanut (diagnostic) Hives, Other (See Comments)   Raw peanuts only  OK to eat peanut butter.   Topamax [topiramate] Other (See Comments)   Paranoia   Sulfa Antibiotics Itching   Tape Rash   Zetia [ezetimibe] Itching, Rash        Medication List     TAKE these medications    albuterol 108 (90 Base) MCG/ACT inhaler Commonly known as: VENTOLIN HFA Inhale 2 puffs into the lungs every 6 (six) hours as needed for wheezing or shortness of breath.   amLODipine 5 MG tablet Commonly known as: NORVASC Take 5 mg by mouth daily.   atorvastatin 20 MG tablet Commonly known as: LIPITOR Take 1 tablet (20 mg total) by mouth daily.   carvedilol 12.5 MG tablet Commonly known as: COREG Take 1 tablet (12.5 mg total) by mouth 2 (two) times  daily.   cyclobenzaprine 10 MG tablet Commonly known as: FLEXERIL Take 10 mg by mouth 3 (three) times daily.   dicyclomine 10 MG capsule Commonly known as: BENTYL Take 10 mg by mouth 3 (three) times daily.   DULoxetine 30 MG capsule Commonly known as: CYMBALTA Take 30 mg by mouth daily.   escitalopram 20 MG tablet Commonly known as: LEXAPRO TAKE 1 TABLET(20 MG) BY MOUTH DAILY What changed: See the new instructions.   FeroSul 325 (65 FE) MG tablet Generic drug: ferrous sulfate Take 1 tablet (325 mg total) by mouth every other day.   folic acid 1 MG tablet Commonly known as: FOLVITE Take 1 tablet (1 mg total) by mouth daily.   lamoTRIgine 100 MG tablet Commonly known as: LAMICTAL Take 100 mg by mouth 3 (three) times daily.   loperamide 2 MG capsule Commonly known as: IMODIUM Take 2 mg by mouth 2 (two) times daily.   OLANZapine 10 MG tablet Commonly known as: ZYPREXA Take 10 mg by mouth at bedtime.   omeprazole 40 MG capsule Commonly known as: PRILOSEC Take 1 capsule (40 mg total) by mouth every morning. What changed: when to take this   oxyCODONE-acetaminophen 5-325 MG tablet Commonly known as: PERCOCET/ROXICET Take 1 tablet by mouth daily as needed for severe pain. What changed: when to take this   potassium chloride SA 20 MEQ tablet Commonly known as: KLOR-CON M Take  20 mEq by mouth daily.   prazosin 1 MG capsule Commonly known as: MINIPRESS Take 2 capsules (2 mg total) by mouth at bedtime.   pregabalin 100 MG capsule Commonly known as: LYRICA Take 100 mg by mouth 2 (two) times daily.   Saphris 5 MG Subl 24 hr tablet Generic drug: asenapine Place 5 mg under the tongue 2 (two) times daily.   Torsemide 40 MG Tabs Take 40 mg by mouth 2 (two) times daily. What changed:  medication strength how much to take   Trelegy Ellipta 100-62.5-25 MCG/ACT Aepb Generic drug: Fluticasone-Umeclidin-Vilant INHALE 1 PUFF INTO THE LUNGS DAILY        Follow-up  Information     Ethelene Hal, MD. Schedule an appointment as soon as possible for a visit in 1 week(s).   Specialty: Nephrology Contact information: [address] [place] [address] [phone]                Allergies  Allergen Reactions   Asa [Aspirin] Other (See Comments)    GI bleeds   Hydrocodone Shortness Of Breath   Latuda [Lurasidone Hcl] Anaphylaxis   Magnesium-Containing Compounds Anaphylaxis and Other (See Comments)    Tolerated Ensure   Prednisone Anaphylaxis, Swelling and Other (See Comments)    Swelling of tongue, lips, throat   Ultram [Tramadol] Anaphylaxis, Swelling and Rash   Codeine Nausea And Vomiting and Rash   Desyrel [Trazodone] Other (See Comments)    Paranoia    Hydralazine Other (See Comments)    Muscle spasms   Peanut (Diagnostic) Hives and Other (See Comments)    Raw peanuts only  OK to eat peanut butter.   Topamax [Topiramate] Other (See Comments)    Paranoia   Sulfa Antibiotics Itching   Tape Rash   Zetia [Ezetimibe] Itching and Rash    Consultations: Nephrology   Procedures/Studies: DG Chest 2 View  Result Date: 06/21/2022 CLINICAL DATA:  fall, cp EXAM: CHEST - 2 VIEW COMPARISON:  CXR 06/09/22 FINDINGS: Cardiomegaly. No pleural effusion. No pneumothorax. Findings of mild pulmonary edema. Chronic left-sided rib fractures left seventh and eighth ribs. No radiographically apparent new displaced rib fracture. Visualized upper abdomen is unremarkable. Vertebral body heights are maintained. IMPRESSION: Cardiomegaly with mild pulmonary edema. Electronically Signed   By: Lorenza Cambridge M.D.   On: 06/21/2022 18:04   CT HEAD WO CONTRAST ( )  Result Date: 06/21/2022 CLINICAL DATA:  Head trauma, moderate-severe fall, pt poor historian uncertain of head injury and some baseline AMS present; Neck trauma, midline tenderness (Age 48-64y) EXAM: CT HEAD WITHOUT CONTRAST CT CERVICAL SPINE WITHOUT CONTRAST TECHNIQUE: Multidetector CT imaging of the  head and cervical spine was performed following the standard protocol without intravenous contrast. Multiplanar CT image reconstructions of the cervical spine were also generated. RADIATION DOSE REDUCTION: This exam was performed according to the departmental dose-optimization program which includes automated exposure control, adjustment of the mA and/or kV according to patient size and/or use of iterative reconstruction technique. COMPARISON:  CT Head 07/09/21 FINDINGS: CT HEAD FINDINGS Brain: No evidence of acute infarction, hemorrhage, hydrocephalus, extra-axial collection or mass lesion/mass effect. Sequela of mild chronic microvascular ischemic change. Vascular: No hyperdense vessel or unexpected calcification. Skull: Normal. Negative for fracture or focal lesion. Sinuses/Orbits: Small bilateral mastoid effusions. No middle ear effusion. Paranasal sinuses are clear. Unremarkable. Other: None. CT CERVICAL SPINE FINDINGS Alignment: There is straightening of the normal cervical lordosis. Skull base and vertebrae: No acute fracture. No primary bone lesion or focal pathologic process.  Soft tissues and spinal canal: No prevertebral fluid or swelling. No visible canal hematoma. Disc levels:  No evidence of high-grade canal stenosis. Upper chest: Negative. Other: None IMPRESSION: 1. No acute intracranial abnormality. 2. No acute fracture or traumatic subluxation of the cervical spine. 3. Small bilateral mastoid effusions. Electronically Signed   By: Lorenza Cambridge M.D.   On: 06/21/2022 18:01   CT Cervical Spine Wo Contrast  Result Date: 06/21/2022 CLINICAL DATA:  Head trauma, moderate-severe fall, pt poor historian uncertain of head injury and some baseline AMS present; Neck trauma, midline tenderness (Age 69-64y) EXAM: CT HEAD WITHOUT CONTRAST CT CERVICAL SPINE WITHOUT CONTRAST TECHNIQUE: Multidetector CT imaging of the head and cervical spine was performed following the standard protocol without intravenous  contrast. Multiplanar CT image reconstructions of the cervical spine were also generated. RADIATION DOSE REDUCTION: This exam was performed according to the departmental dose-optimization program which includes automated exposure control, adjustment of the mA and/or kV according to patient size and/or use of iterative reconstruction technique. COMPARISON:  CT Head 07/09/21 FINDINGS: CT HEAD FINDINGS Brain: No evidence of acute infarction, hemorrhage, hydrocephalus, extra-axial collection or mass lesion/mass effect. Sequela of mild chronic microvascular ischemic change. Vascular: No hyperdense vessel or unexpected calcification. Skull: Normal. Negative for fracture or focal lesion. Sinuses/Orbits: Small bilateral mastoid effusions. No middle ear effusion. Paranasal sinuses are clear. Unremarkable. Other: None. CT CERVICAL SPINE FINDINGS Alignment: There is straightening of the normal cervical lordosis. Skull base and vertebrae: No acute fracture. No primary bone lesion or focal pathologic process. Soft tissues and spinal canal: No prevertebral fluid or swelling. No visible canal hematoma. Disc levels:  No evidence of high-grade canal stenosis. Upper chest: Negative. Other: None IMPRESSION: 1. No acute intracranial abnormality. 2. No acute fracture or traumatic subluxation of the cervical spine. 3. Small bilateral mastoid effusions. Electronically Signed   By: Lorenza Cambridge M.D.   On: 06/21/2022 18:01   DG Abd Portable 1V  Result Date: 06/11/2022 CLINICAL DATA:  Low back pain EXAM: PORTABLE ABDOMEN - 1 VIEW COMPARISON:  None Available. FINDINGS: Severely underpenetrated AP portable abdominal radiograph. Large burden of stool throughout the colon and rectum. Nonobstructive bowel gas pattern. No free air on supine radiograph. No obvious calculi, assessment very limited by underpenetration and overlying stool burden. IMPRESSION: 1. Severely underpenetrated AP portable abdominal radiograph. Large burden of stool  throughout the colon and rectum. Nonobstructive bowel gas pattern. 2. No obvious calculi, assessment very limited by underpenetration and overlying stool burden. Electronically Signed   By: Jearld Lesch M.D.   On: 06/11/2022 13:42   CT HEAD WO CONTRAST ( )  Result Date: 06/11/2022 CLINICAL DATA:  Altered mental status EXAM: CT HEAD WITHOUT CONTRAST TECHNIQUE: Contiguous axial images were obtained from the base of the skull through the vertex without intravenous contrast. RADIATION DOSE REDUCTION: This exam was performed according to the departmental dose-optimization program which includes automated exposure control, adjustment of the mA and/or kV according to patient size and/or use of iterative reconstruction technique. COMPARISON:  06/02/2022 FINDINGS: Brain: There is no mass, hemorrhage or extra-axial collection. The size and configuration of the ventricles and extra-axial CSF spaces are normal. The brain parenchyma is normal, without acute or chronic infarction. Vascular: No abnormal hyperdensity of the major intracranial arteries or dural venous sinuses. No intracranial atherosclerosis. Skull: The visualized skull base, calvarium and extracranial soft tissues are normal. Sinuses/Orbits: No fluid levels or advanced mucosal thickening of the visualized paranasal sinuses. No mastoid or middle ear effusion. The orbits  are normal. IMPRESSION: Normal head CT. Electronically Signed   By: Deatra Robinson M.D.   On: 06/11/2022 00:02   DG Chest Portable 1 View  Result Date: 06/10/2022 CLINICAL DATA:  Shortness of breath.  Fall. EXAM: PORTABLE CHEST 1 VIEW COMPARISON:  Chest radiographs 06/02/2022 and 02/28/2022 FINDINGS: Cardiac silhouette is again moderately enlarged. Mediastinal contours are within normal limits. Low lung volumes. No focal airspace opacity. Old healed posterior left rib fracture. No definite pleural effusion. No pneumothorax. No acute skeletal abnormality. Mild dextrocurvature of the mid to  upper thoracic spine with moderate multilevel degenerative disc changes. IMPRESSION: 1. Low lung volumes. No focal airspace opacity. 2. Moderate cardiomegaly. Electronically Signed   By: Neita Garnet M.D.   On: 06/10/2022 12:50   DG Pelvis Portable  Result Date: 06/10/2022 CLINICAL DATA:  Fall. EXAM: PORTABLE PELVIS 1-2 VIEWS COMPARISON:  AP pelvis 07/09/2021, pelvis and bilateral hip radiographs 06/03/2022 FINDINGS: Single frontal view of the pelvis.  The Right femur is obliqued, causing bone overlap and limiting evaluation. Within this limitation, no definite acute fracture is seen. Mild pubic symphysis joint space narrowing and peripheral osteophytosis. Mild bilateral sacroiliac subchondral sclerosis. IMPRESSION: The right femur is obliqued, causing bone overlap and limiting evaluation. Within this limitation, no definite acute fracture is seen. Electronically Signed   By: Neita Garnet M.D.   On: 06/10/2022 12:49   ECHOCARDIOGRAM COMPLETE  Result Date: 06/06/2022    ECHOCARDIOGRAM REPORT   Patient Name:   S[facility]. DOBBERTIN [facility] Date of Exam: 06/06/2022 Medical Rec #:  [id number]       Height:       64.0 in Accession #:    [id number]      Weight:       209.4 lb Date of Birth:  02-08-1960       BSA:          1.995 m Patient Age:    62 years        BP:           118/71 mmHg Patient Gender: F               HR:           64 bpm. Exam Location:  Inpatient Procedure: 2D Echo, Cardiac Doppler and Color Doppler Indications:    CHF - Acute Diastolic  History:        Patient has prior history of Echocardiogram examinations, most                 recent 02/04/2022. CHF, CAD, CKD, COPD, Carotid Disease and                 Stroke, Signs/Symptoms:Edema, Chest Pain, Syncope and Dyspnea;                 Risk Factors:Diabetes, Hypertension, Dyslipidemia, cocaine                 abuse, Former Smoker and Sleep Apnea.  Sonographer:    Wallie Char Referring Phys: [phone] Len Blalock LIN IMPRESSIONS  1. Left ventricular ejection fraction,  by estimation, is 60 to 65%. The left ventricle has normal function. The left ventricle has no regional wall motion abnormalities. Left ventricular diastolic parameters are consistent with Grade I diastolic dysfunction (impaired relaxation). Elevated left atrial pressure.  2. Right ventricular systolic function is normal. The right ventricular size is normal.  3. Left atrial size was moderately dilated.  4. Right atrial size was moderately dilated.  5.  The mitral valve is normal in structure. Mild mitral valve regurgitation.  6. The aortic valve is tricuspid. Aortic valve regurgitation is not visualized. No aortic stenosis is present. Comparison(s): No significant change from prior study. Prior images reviewed side by side. Although estimated mean left atrial pressure is still elevated, it is less severe than on the 02/04/2022 study. Findings suggest high output heart failure (consider anemia, thyrotoxicosis, AF fistula, etc.). FINDINGS  Left Ventricle: Left ventricular ejection fraction, by estimation, is 60 to 65%. The left ventricle has normal function. The left ventricle has no regional wall motion abnormalities. The left ventricular internal cavity size was normal in size. There is  no left ventricular hypertrophy. Left ventricular diastolic parameters are consistent with Grade I diastolic dysfunction (impaired relaxation). Elevated left atrial pressure. Right Ventricle: The right ventricular size is normal. No increase in right ventricular wall thickness. Right ventricular systolic function is normal. Left Atrium: Left atrial size was moderately dilated. Right Atrium: Right atrial size was moderately dilated. Pericardium: Trivial pericardial effusion is present. Mitral Valve: The mitral valve is normal in structure. Mild mitral valve regurgitation. MV peak gradient, 7.2 mmHg. The mean mitral valve gradient is 3.0 mmHg. Tricuspid Valve: The tricuspid valve is normal in structure. Tricuspid valve regurgitation  is mild. Aortic Valve: Aortic valve gradients are exaggerated by high cardiac output. The aortic valve is tricuspid. Aortic valve regurgitation is not visualized. No aortic stenosis is present. Aortic valve mean gradient measures 10.0 mmHg. Aortic valve peak gradient measures 20.7 mmHg. Aortic valve area, by VTI measures 2.24 cm. Pulmonic Valve: The pulmonic valve was grossly normal. Pulmonic valve regurgitation is not visualized. Aorta: The aortic root and ascending aorta are structurally normal, with no evidence of dilitation. IAS/Shunts: No atrial level shunt detected by color flow Doppler.  LEFT VENTRICLE PLAX 2D LVIDd:         5.40 cm      Diastology LVIDs:         3.50 cm      LV e' medial:    5.73 cm/s LV PW:         1.10 cm      LV E/e' medial:  20.2 LV IVS:        0.90 cm      LV e' lateral:   8.56 cm/s LVOT diam:     1.90 cm      LV E/e' lateral: 13.6 LV SV:         108 LV SV Index:   54 LVOT Area:     2.84 cm  LV Volumes (MOD) LV vol d, MOD A2C: 126.0 ml LV vol d, MOD A4C: 157.0 ml LV vol s, MOD A2C: 49.5 ml LV vol s, MOD A4C: 60.0 ml LV SV MOD A2C:     76.5 ml LV SV MOD A4C:     157.0 ml LV SV MOD BP:      85.0 ml RIGHT VENTRICLE             IVC RV Basal diam:  4.60 cm     IVC diam: 2.40 cm RV S prime:     17.30 cm/s TAPSE (M-mode): 2.6 cm LEFT ATRIUM              Index        RIGHT ATRIUM           Index LA diam:        4.90 cm  2.46 cm/m   RA Area:  25.10 cm LA Vol (A2C):   103.0 ml 51.63 ml/m  RA Volume:   81.30 ml  40.75 ml/m LA Vol (A4C):   92.8 ml  46.51 ml/m LA Biplane Vol: 101.0 ml 50.62 ml/m  AORTIC VALVE AV Area (Vmax):    2.18 cm AV Area (Vmean):   2.30 cm AV Area (VTI):     2.24 cm AV Vmax:           227.50 cm/s AV Vmean:          149.500 cm/s AV VTI:            0.483 m AV Peak Grad:      20.7 mmHg AV Mean Grad:      10.0 mmHg LVOT Vmax:         175.00 cm/s LVOT Vmean:        121.500 cm/s LVOT VTI:          0.382 m LVOT/AV VTI ratio: 0.79  AORTA Ao Root diam: 3.00 cm Ao Asc  diam:  3.60 cm MITRAL VALVE                TRICUSPID VALVE MV Area (PHT): 2.71 cm     TR Peak grad:   36.7 mmHg MV Area VTI:   2.82 cm     TR Vmax:        303.00 cm/s MV Peak grad:  7.2 mmHg MV Mean grad:  3.0 mmHg     SHUNTS MV Vmax:       1.34 m/s     Systemic VTI:  0.38 m MV Vmean:      79.0 cm/s    Systemic Diam: 1.90 cm MV Decel Time: 280 msec MV E velocity: 116.00 cm/s MV A velocity: 130.00 cm/s MV E/A ratio:  0.89 Mihai Croitoru MD Electronically signed by Thurmon Fair MD Signature Date/Time: 06/06/2022/3:28:20 PM    Final    US RENAL  Result Date: 06/05/2022 CLINICAL DATA:  Acute kidney injury. EXAM: RENAL / URINARY TRACT ULTRASOUND COMPLETE COMPARISON:  02/04/2022 FINDINGS: Right Kidney: Renal measurements: 7.4 x 4.3 x 4.3 centimeters = volume: 72 ML. Hyperechoic renal parenchyma. No mass or hydronephrosis. Left Kidney: Renal measurements: 10.5 x 4.5 x 3.8 centimeters = volume: 94 mL. Hyperechoic renal parenchyma. No mass or hydronephrosis. Bladder: Appears normal for degree of bladder distention. Other: None. IMPRESSION: 1. Hyperechoic renal parenchyma, consistent with history of renal disease. 2. No hydronephrosis or mass. Electronically Signed   By: Norva Pavlov M.D.   On: 06/05/2022 17:31   VAS [name] LOWER EXTREMITY VENOUS (DVT)  Result Date: 06/05/2022  Lower Venous DVT Study Patient Name:  JOHNITA BEALS [facility]  Date of Exam:   06/04/2022 Medical Rec #: [id number]        Accession #:    [id number] Date of Birth: 1961/01/01        Patient Gender: F Patient Age:   54 years Exam Location:  [facility] Procedure:      VAS [name] LOWER EXTREMITY VENOUS (DVT) Referring Phys: Lorin Glass --------------------------------------------------------------------------------  Indications: Swelling.  Comparison Study: 06-08-2021 Prior bilateral lower extremity venous study was                   negative for DVT. Performing Technologist: Jean Rosenthal RDMS, RVT  Examination Guidelines: A complete evaluation  includes B-mode imaging, spectral Doppler, color Doppler, and power Doppler as needed of all accessible portions of each vessel. Bilateral testing is considered an integral part of  a complete examination. Limited examinations for reoccurring indications may be performed as noted. The reflux portion of the exam is performed with the patient in reverse Trendelenburg.  +---------+---------------+---------+-----------+----------+--------------+ RIGHT    CompressibilityPhasicitySpontaneityPropertiesThrombus Aging +---------+---------------+---------+-----------+----------+--------------+ CFV      Full           Yes      Yes                                 +---------+---------------+---------+-----------+----------+--------------+ SFJ      Full                                                        +---------+---------------+---------+-----------+----------+--------------+ FV Prox  Full                                                        +---------+---------------+---------+-----------+----------+--------------+ FV Mid   Full                                                        +---------+---------------+---------+-----------+----------+--------------+ FV DistalFull                                                        +---------+---------------+---------+-----------+----------+--------------+ PFV      Full                                                        +---------+---------------+---------+-----------+----------+--------------+ POP      Full           Yes      Yes                                 +---------+---------------+---------+-----------+----------+--------------+ PTV      Full                                                        +---------+---------------+---------+-----------+----------+--------------+ PERO     Full                                                         +---------+---------------+---------+-----------+----------+--------------+   +---------+---------------+---------+-----------+----------+--------------+ LEFT     CompressibilityPhasicitySpontaneityPropertiesThrombus Aging +---------+---------------+---------+-----------+----------+--------------+ CFV      Full  Yes      Yes                                 +---------+---------------+---------+-----------+----------+--------------+ SFJ      Full                                                        +---------+---------------+---------+-----------+----------+--------------+ FV Prox  Full                                                        +---------+---------------+---------+-----------+----------+--------------+ FV Mid   Full                                                        +---------+---------------+---------+-----------+----------+--------------+ FV DistalFull                                                        +---------+---------------+---------+-----------+----------+--------------+ PFV      Full                                                        +---------+---------------+---------+-----------+----------+--------------+ POP      Full           Yes      Yes                                 +---------+---------------+---------+-----------+----------+--------------+ PTV      Full                                                        +---------+---------------+---------+-----------+----------+--------------+ PERO     Full                                                        +---------+---------------+---------+-----------+----------+--------------+     Summary: RIGHT: - There is no evidence of deep vein thrombosis in the lower extremity.  - No cystic structure found in the popliteal fossa.  LEFT: - There is no evidence of deep vein thrombosis in the lower extremity.  - No cystic structure found in the popliteal fossa.   *See table(s) above for measurements and observations. Electronically signed by Sherald Hess MD on 06/05/2022 at 9:18:30  AM.    Final    DG HIPS BILAT WITH PELVIS 2V  Result Date: 06/03/2022 CLINICAL DATA:  Pain in hips, fall EXAM: DG HIP (WITH OR WITHOUT PELVIS) 2V BILAT COMPARISON:  None Available. FINDINGS: There is no evidence of hip fracture or dislocation. There is no evidence of arthropathy or other focal bone abnormality. IMPRESSION: Negative. Electronically Signed   By: Charlett Nose M.D.   On: 06/03/2022 09:59   DG CHEST PORT 1 VIEW  Result Date: 06/02/2022 CLINICAL DATA:  Shortness of breath EXAM: PORTABLE CHEST 1 VIEW COMPARISON:  03/02/2022 FINDINGS: Cardiomegaly with mild central congestion. Possible patchy right mid and lower pulmonary opacities. Multiple old left-sided rib fractures. No pleural effusion or pneumothorax IMPRESSION: 1. Cardiomegaly with mild central congestion. 2. Possible patchy right mid and lower lung airspace disease which may be due to early pneumonia Electronically Signed   By: Jasmine Pang M.D.   On: 06/02/2022 18:16   CT Head Wo Contrast  Result Date: 06/02/2022 CLINICAL DATA:  Head trauma, abnormal mental status, fall EXAM: CT HEAD WITHOUT CONTRAST TECHNIQUE: Contiguous axial images were obtained from the base of the skull through the vertex without intravenous contrast. RADIATION DOSE REDUCTION: This exam was performed according to the departmental dose-optimization program which includes automated exposure control, adjustment of the mA and/or kV according to patient size and/or use of iterative reconstruction technique. COMPARISON:  CT head 07/09/2021 FINDINGS: Brain: No intracranial hemorrhage, mass effect, or evidence of acute infarct. No hydrocephalus. No extra-axial fluid collection. Generalized cerebral atrophy. Ill-defined hypoattenuation within the cerebral white matter is nonspecific but consistent with chronic small vessel ischemic disease. Vascular:  No hyperdense vessel. Intracranial arterial calcification. Skull: No fracture or focal lesion. Sinuses/Orbits: No acute finding. Paranasal sinuses and mastoid air cells are well aerated. Other: None. IMPRESSION: 1. No evidence of acute intracranial abnormality. 2. Atrophy and chronic small vessel ischemic disease. Electronically Signed   By: Minerva Fester M.D.   On: 06/02/2022 17:51      Subjective: Patient seen and examined.  Denies any new complaints.  [name] happy that her leg swelling have improved.  Comfortable going home and follow-up with nephrology outpatient.  Discharge Exam: Vitals:   06/24/22 0419 06/24/22 0719  BP: (!) 145/82 (!) 166/86  Pulse: 63 62  Resp: 18 16  Temp: 98.6 F (37 C) 98.1 F (36.7 C)  SpO2: 100% 99%   Vitals:   06/23/22 1623 06/23/22 1938 06/24/22 0419 06/24/22 0719  BP: (!) 154/85 (!) 144/81 (!) 145/82 (!) 166/86  Pulse: 66 72 63 62  Resp: 16 18 18 16   Temp: 98.7 F (37.1 C) 98.7 F (37.1 C) 98.6 F (37 C) 98.1 F (36.7 C)  TempSrc: Oral Oral  Oral  SpO2: 98% 99% 100% 99%  Weight:      Height:        General: Pt is alert, awake, not in acute distress Cardiovascular: RRR, S1/S2 +, no rubs, no gallops Respiratory: CTA bilaterally, no wheezing, no rhonchi Abdominal: Soft, NT, ND, bowel sounds + Extremities: no edema, no cyanosis    The results of significant diagnostics from this hospitalization (including imaging, microbiology, ancillary and laboratory) are listed below for reference.     Microbiology: No results found for this or any previous visit (from the past 240 hour(s)).   Labs: BNP (last 3 results) Recent Labs    06/02/22 1653 06/03/22 0220 06/10/22 1217  BNP 363.6* 303.1* 256.0*   Basic Metabolic Panel: Recent Labs  Lab 06/21/22 1636 06/22/22 0439 06/23/22 0703 06/23/22 1919 06/24/22 0443  NA 137 139 141  --  142  K 4.9 4.1 3.5  --  3.2*  CL 105 109 105  --  103  CO2 21* 20* 22  --  25  GLUCOSE 113* 90 96  --   121*  BUN 107* 108* 103*  --  95*  CREATININE 5.44* 5.48* 5.52*  --  5.15*  CALCIUM 8.9 8.4* 9.0  --  9.2  PHOS  --   --   --  5.0*  --    Liver Function Tests: Recent Labs  Lab 06/21/22 1636  AST 23  ALT 12  ALKPHOS 138*  BILITOT 0.4  PROT 7.2  ALBUMIN 3.8   No results for input(s): "LIPASE", "AMYLASE" in the last 168 hours. No results for input(s): "AMMONIA" in the last 168 hours. CBC: Recent Labs  Lab 06/21/22 1636 06/22/22 0439 06/24/22 0443  WBC 5.0 3.6* 4.4  HGB 8.6* 7.8* 9.3*  HCT 28.8* 26.4* 31.1*  MCV 92.0 92.3 89.4  PLT 236 198 224   Cardiac Enzymes: Recent Labs  Lab 06/21/22 1636  CKTOTAL 623*   BNP: Invalid input(s): "POCBNP" CBG: No results for input(s): "GLUCAP" in the last 168 hours. D-Dimer No results for input(s): "DDIMER" in the last 72 hours. Hgb A1c No results for input(s): "HGBA1C" in the last 72 hours. Lipid Profile No results for input(s): "CHOL", "HDL", "LDLCALC", "TRIG", "CHOLHDL", "LDLDIRECT" in the last 72 hours. Thyroid function studies No results for input(s): "TSH", "T4TOTAL", "T3FREE", "THYROIDAB" in the last 72 hours.  Invalid input(s): "FREET3" Anemia work up Recent Labs    06/23/22 1919  FERRITIN 35  TIBC 386  IRON 47   Urinalysis    Component Value Date/Time   COLORURINE COLORLESS (A) 06/10/2022 2136   APPEARANCEUR CLEAR 06/10/2022 2136   LABSPEC 1.006 06/10/2022 2136   PHURINE 6.0 06/10/2022 2136   GLUCOSEU NEGATIVE 06/10/2022 2136   HGBUR SMALL (A) 06/10/2022 2136   BILIRUBINUR NEGATIVE 06/10/2022 2136   BILIRUBINUR NEG 05/21/2014 1155   KETONESUR NEGATIVE 06/10/2022 2136   PROTEINUR 100 (A) 06/10/2022 2136   UROBILINOGEN 0.2 09/30/2019 1557   NITRITE NEGATIVE 06/10/2022 2136   LEUKOCYTESUR NEGATIVE 06/10/2022 2136   Sepsis Labs Recent Labs  Lab 06/21/22 1636 06/22/22 0439 06/24/22 0443  WBC 5.0 3.6* 4.4   Microbiology No results found for this or any previous visit (from the past 240  hour(s)).   Time coordinating discharge: Over 30 minutes  SIGNED:   Ollen Bowl, MD  [facility] 06/24/2022, 2:15 PM Pager   If 7PM-7AM, please contact night-coverage [email or web address]

## 2022-06-24 NOTE — Progress Notes (Signed)
Pt to be discharge home to self care.  Reviewed AVS and discharge instructions.  Informed pt of changes to her medications and that she had medication to be picked up from her preferred pharmacy.  Answered any pending questions pt had.  She had no further questions.  Assisted pt in getting dress and gathering her belongings.  Removed PIV which was CDI and free from s/sx of infection upon removal.  Assisted pt into wheelchair.  Vikki Ports, RN from 2W escorted pt down to visitor's entrance where her ride awaited to take her home.

## 2022-06-25 DIAGNOSIS — J45909 Unspecified asthma, uncomplicated: Secondary | ICD-10-CM | POA: Diagnosis not present

## 2022-06-25 DIAGNOSIS — G4733 Obstructive sleep apnea (adult) (pediatric): Secondary | ICD-10-CM | POA: Diagnosis not present

## 2022-06-25 DIAGNOSIS — R062 Wheezing: Secondary | ICD-10-CM | POA: Diagnosis not present

## 2022-06-26 ENCOUNTER — Telehealth: Payer: Self-pay | Admitting: *Deleted

## 2022-06-26 ENCOUNTER — Telehealth: Payer: Self-pay

## 2022-06-26 DIAGNOSIS — I5033 Acute on chronic diastolic (congestive) heart failure: Secondary | ICD-10-CM

## 2022-06-26 DIAGNOSIS — N179 Acute kidney failure, unspecified: Secondary | ICD-10-CM

## 2022-06-26 NOTE — Consult Note (Signed)
   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  Review of patient's electronic medical record reveals patient is being followed and having follow up with [facility] Medicine ad patient is active with the Advanced HF team noted.   Plan:  Referral request for community care coordination:  Will refer for care coordination for readmission prevention measure  Of note, [name] Care Management/Population Health does not replace or interfere with any arrangements made by the Inpatient Transition of Care team.  For questions contact:   Charlesetta Shanks, RN BSN CCM [facility]  [phone] business mobile phone Toll free office [phone]  *Concierge Line  [phone] Fax number: [phone] [email or web address]

## 2022-06-26 NOTE — Progress Notes (Signed)
  Care Coordination Note  06/26/2022 Name: Tiffany [name] MRN: [id number] DOB: October 22, 1960  Tiffany [name] is a 62 y.o. year old female who is a primary care patient of Tiffany Kocher, DO and is actively engaged with the care management team. I reached out to [name] by phone today to assist with scheduling an initial visit with the RN Case Manager  Follow up plan: Unsuccessful telephone outreach attempt made.  [facility]  Care Coordination Care Guide  Direct Dial: [phone]

## 2022-06-27 ENCOUNTER — Telehealth: Payer: Self-pay

## 2022-06-27 NOTE — Transitions of Care (Post Inpatient/ED Visit) (Signed)
   06/27/2022  Name: CILICIA BORDEN MRN: [id number] DOB: 02/17/1960  Today's TOC FU Call Status: Today's TOC FU Call Status:: Unsuccessul Call (1st Attempt) Unsuccessful Call (1st Attempt) Date: 06/27/22  Attempted to reach the patient regarding the most recent Inpatient/ED visit.  Follow Up Plan: Additional outreach attempts will be made to reach the patient to complete the Transitions of Care (Post Inpatient/ED visit) call.   Jodelle Gross, RN, BSN, CCM Care Management Coordinator [place]/[facility] Phone: [phone]/Fax: [phone]

## 2022-06-28 ENCOUNTER — Telehealth: Payer: Self-pay

## 2022-06-28 NOTE — Transitions of Care (Post Inpatient/ED Visit) (Signed)
   06/28/2022  Name: Tiffany [name] MRN: [id number] DOB: [birth year]  Today's TOC FU Call Status: Today's TOC FU Call Status:: Unsuccessful Call (2nd Attempt) Unsuccessful Call (2nd Attempt) Date: 06/28/22  Attempted to reach the patient regarding the most recent Inpatient/ED visit.  Follow Up Plan: Additional outreach attempts will be made to reach the patient to complete the Transitions of Care (Post Inpatient/ED visit) call.   Jodelle Gross, RN, BSN, CCM Care Management Coordinator [place]/[facility] Phone: [phone]/Fax: [phone]

## 2022-06-28 NOTE — Progress Notes (Signed)
  Care Coordination  Outreach Note  06/28/2022 Name: Tiffany [name] MRN: [id number] DOB: [birth year]   Care Coordination Outreach Attempts: A second unsuccessful outreach was attempted today to offer the patient with information about available care coordination services.  Follow Up Plan:  Additional outreach attempts will be made to offer the patient care coordination information and services.   Encounter Outcome:  No Answer  Christie Nottingham  Care Coordination Care Guide  Direct Dial: [phone]

## 2022-06-29 ENCOUNTER — Telehealth: Payer: Self-pay

## 2022-06-29 NOTE — Transitions of Care (Post Inpatient/ED Visit) (Signed)
   06/29/2022  Name: Tiffany [name] MRN: [id number] DOB: 1960-12-28  Today's TOC FU Call Status: Today's TOC FU Call Status:: Unsuccessful Call (3rd Attempt) Unsuccessful Call (3rd Attempt) Date: 06/29/22  Attempted to reach the patient regarding the most recent Inpatient/ED visit.  Follow Up Plan: No further outreach attempts will be made at this time. We have been unable to contact the patient.  Jodelle Gross, RN, BSN, CCM Care Management Coordinator [place]/[facility]

## 2022-06-30 ENCOUNTER — Telehealth: Payer: Self-pay

## 2022-06-30 NOTE — Telephone Encounter (Signed)
Agree with care plan outlined by [facility] CMA.  Briefly: Patient is a 62 y.o female with worsening renal function and multiple co-morbid conditions. Leg and hand swelling is chronic and likely 2/2 renal function, CHF, poor diet. Unclear if patient will pursue dialysis as she is also considering palliative options however no change to code status nor ACP made during last admission. No changes to current medical management will be made. Primary goal is to have patient see nephrology as an outpatient to discuss dialysis, and if she chooses not to purse dialysis highly recommend continued conversations with palliative care around [facility].  I will follow-up with patient on 6/20, or sooner if necessary.

## 2022-06-30 NOTE — Telephone Encounter (Signed)
Patient returns call to nurse line.   She reports last night she started swelling after she ate a "very salty" meal.  She reports she is more swollen on the right side. She denies SOB, chest pains or redness.  Patient scheduled for a hospital FU with PCP on 6/20.  Precautions discussed with patient for sooner care.

## 2022-06-30 NOTE — Telephone Encounter (Signed)
Patient LVM on nurse line reporting she missed a call from PCP.   In the VM she reports swelling of her hands and feet.  It appears the phone call she received today was from [facility].   I attempted to call patient back, however she did not answer and her VM was full.   Will forward to PCP.

## 2022-07-01 DIAGNOSIS — K5730 Diverticulosis of large intestine without perforation or abscess without bleeding: Secondary | ICD-10-CM | POA: Diagnosis not present

## 2022-07-01 DIAGNOSIS — J9612 Chronic respiratory failure with hypercapnia: Secondary | ICD-10-CM | POA: Diagnosis not present

## 2022-07-01 DIAGNOSIS — I130 Hypertensive heart and chronic kidney disease with heart failure and stage 1 through stage 4 chronic kidney disease, or unspecified chronic kidney disease: Secondary | ICD-10-CM | POA: Diagnosis not present

## 2022-07-01 DIAGNOSIS — J9611 Chronic respiratory failure with hypoxia: Secondary | ICD-10-CM | POA: Diagnosis not present

## 2022-07-01 DIAGNOSIS — I2510 Atherosclerotic heart disease of native coronary artery without angina pectoris: Secondary | ICD-10-CM | POA: Diagnosis not present

## 2022-07-01 DIAGNOSIS — E1142 Type 2 diabetes mellitus with diabetic polyneuropathy: Secondary | ICD-10-CM | POA: Diagnosis not present

## 2022-07-01 DIAGNOSIS — G4733 Obstructive sleep apnea (adult) (pediatric): Secondary | ICD-10-CM | POA: Diagnosis not present

## 2022-07-01 DIAGNOSIS — I252 Old myocardial infarction: Secondary | ICD-10-CM | POA: Diagnosis not present

## 2022-07-01 DIAGNOSIS — J439 Emphysema, unspecified: Secondary | ICD-10-CM | POA: Diagnosis not present

## 2022-07-01 DIAGNOSIS — E785 Hyperlipidemia, unspecified: Secondary | ICD-10-CM | POA: Diagnosis not present

## 2022-07-01 DIAGNOSIS — M1030 Gout due to renal impairment, unspecified site: Secondary | ICD-10-CM | POA: Diagnosis not present

## 2022-07-01 DIAGNOSIS — I5033 Acute on chronic diastolic (congestive) heart failure: Secondary | ICD-10-CM | POA: Diagnosis not present

## 2022-07-01 DIAGNOSIS — N184 Chronic kidney disease, stage 4 (severe): Secondary | ICD-10-CM | POA: Diagnosis not present

## 2022-07-01 DIAGNOSIS — J4489 Other specified chronic obstructive pulmonary disease: Secondary | ICD-10-CM | POA: Diagnosis not present

## 2022-07-01 DIAGNOSIS — D62 Acute posthemorrhagic anemia: Secondary | ICD-10-CM | POA: Diagnosis not present

## 2022-07-01 DIAGNOSIS — M542 Cervicalgia: Secondary | ICD-10-CM | POA: Diagnosis not present

## 2022-07-01 DIAGNOSIS — M159 Polyosteoarthritis, unspecified: Secondary | ICD-10-CM | POA: Diagnosis not present

## 2022-07-01 DIAGNOSIS — E1122 Type 2 diabetes mellitus with diabetic chronic kidney disease: Secondary | ICD-10-CM | POA: Diagnosis not present

## 2022-07-01 DIAGNOSIS — D631 Anemia in chronic kidney disease: Secondary | ICD-10-CM | POA: Diagnosis not present

## 2022-07-01 DIAGNOSIS — G894 Chronic pain syndrome: Secondary | ICD-10-CM | POA: Diagnosis not present

## 2022-07-01 DIAGNOSIS — G931 Anoxic brain damage, not elsewhere classified: Secondary | ICD-10-CM | POA: Diagnosis not present

## 2022-07-03 NOTE — Progress Notes (Signed)
  Care Coordination  Outreach Note  07/03/2022 Name: Tiffany [name] MRN: [id number] DOB: 1960-12-04   Care Coordination Outreach Attempts: An unsuccessful telephone outreach was attempted today to offer the patient information about available care coordination services.  Follow Up Plan:  Additional outreach attempts will be made to offer the patient care coordination information and services.  Per APS worker patient just was able to get phone back on. Encounter Outcome:  No Answer  Christie Nottingham  Care Coordination Care Guide  Direct Dial: [phone]

## 2022-07-03 NOTE — Telephone Encounter (Signed)
Patient returns call to nurse line.   Reports continued BLE edema, worse on the right side.   Reports that she started to have slight shortness of breath earlier today, this has since went away. No current shortness of breath or chest pain. Patient speaking in complete sentences. Advised that if shortness of breath was to return or she began having chest pain, she should seek care in the ED as soon as possible.   Advised patient due to symptoms, I would recommend being evaluated prior to scheduled appointment on Thursday. Scheduled with Dr. Wynelle Link for tomorrow afternoon.   ED precautions discussed. Patient verbalizes understanding.   Veronda Prude, RN

## 2022-07-03 NOTE — Progress Notes (Signed)
  Care Coordination   Note   07/03/2022 Name: AUBRAY COMMANDER MRN: [id number] DOB: 22-Sep-1960  OMAYRA PACKER is a 62 y.o. year old female who sees Tiffany Kocher, [place] for primary care. I reached out to [name] by phone today to offer care coordination services.  Ms. Byrdsong was given information about Care Coordination services today including:   The Care Coordination services include support from the care team which includes your Nurse Coordinator, Clinical Social Worker, or Pharmacist.  The Care Coordination team is here to help remove barriers to the health concerns and goals most important to you. Care Coordination services are voluntary, and the patient may decline or stop services at any time by request to their care team member.   Care Coordination Consent Status: Patient agreed to services and verbal consent obtained.   Follow up plan:  Telephone appointment with care coordination team member scheduled for:  07/11/22  Encounter Outcome:  Pt. Scheduled  [facility] Coordination Care Guide  Direct Dial: [phone]

## 2022-07-04 ENCOUNTER — Ambulatory Visit: Payer: 59 | Admitting: Family Medicine

## 2022-07-04 DIAGNOSIS — E119 Type 2 diabetes mellitus without complications: Secondary | ICD-10-CM | POA: Diagnosis not present

## 2022-07-04 DIAGNOSIS — E782 Mixed hyperlipidemia: Secondary | ICD-10-CM | POA: Diagnosis not present

## 2022-07-04 DIAGNOSIS — I10 Essential (primary) hypertension: Secondary | ICD-10-CM | POA: Diagnosis not present

## 2022-07-04 DIAGNOSIS — N189 Chronic kidney disease, unspecified: Secondary | ICD-10-CM | POA: Diagnosis not present

## 2022-07-04 DIAGNOSIS — R269 Unspecified abnormalities of gait and mobility: Secondary | ICD-10-CM | POA: Diagnosis not present

## 2022-07-04 DIAGNOSIS — R609 Edema, unspecified: Secondary | ICD-10-CM | POA: Diagnosis not present

## 2022-07-04 NOTE — Progress Notes (Deleted)
    SUBJECTIVE:   CHIEF COMPLAINT / HPI:  No chief complaint on file.   Patient called nurse line yesterday reporting worsening bilateral lower extremity edema worse on the right side with some slight shortness of breath which resolved.  She has stage V CKD and has had multiple hospitalizations in the past several weeks, she is supposed to see nephrology to discuss possibly starting dialysis but also palliative care is being considered as well.  She was most recently hospitalized from 6/5 to 6/8 after an unwitnessed fall, also noted to have acute on chronic kidney disease.  She was continued on torsemide 40 mg twice daily.  Recommended to follow-up with nephrology Dr. Juel Burrow in 1 week.  PERTINENT  PMH / PSH: CKD stage V, HFpEF, GERD, bipolar disorder  Patient Care Team: Tiffany Kocher, DO as PCP - General (Family Medicine) Dr. Marquette Old (Family Medicine) Melancon, Hillery Hunter, MD (Inactive) as Resident (Family Medicine) Pricilla Riffle, MD as Consulting Physician (Cardiology) Juanell Fairly, RN as [facility] Care Management   OBJECTIVE:   There were no vitals taken for this visit.  Physical Exam      06/09/2022    3:51 PM  Depression screen PHQ 2/9  Decreased Interest 0  Down, Depressed, Hopeless 0  PHQ - 2 Score 0  Altered sleeping 0  Tired, decreased energy 3  Change in appetite 0  Feeling bad or failure about yourself  0  Trouble concentrating 0  Moving slowly or fidgety/restless 0  Suicidal thoughts 0  PHQ-9 Score 3  Difficult doing work/chores Somewhat difficult     {Show previous vital signs (optional):23777}  {Labs  Heme  Chem  Endocrine  Serology  Results Review (optional):23779}  ASSESSMENT/PLAN:   No problem-specific Assessment & Plan notes found for this encounter.    No follow-ups on file.   Littie Deeds, MD [facility]

## 2022-07-05 DIAGNOSIS — D62 Acute posthemorrhagic anemia: Secondary | ICD-10-CM | POA: Diagnosis not present

## 2022-07-05 DIAGNOSIS — N184 Chronic kidney disease, stage 4 (severe): Secondary | ICD-10-CM | POA: Diagnosis not present

## 2022-07-05 DIAGNOSIS — G4733 Obstructive sleep apnea (adult) (pediatric): Secondary | ICD-10-CM | POA: Diagnosis not present

## 2022-07-05 DIAGNOSIS — M1030 Gout due to renal impairment, unspecified site: Secondary | ICD-10-CM | POA: Diagnosis not present

## 2022-07-05 DIAGNOSIS — I130 Hypertensive heart and chronic kidney disease with heart failure and stage 1 through stage 4 chronic kidney disease, or unspecified chronic kidney disease: Secondary | ICD-10-CM | POA: Diagnosis not present

## 2022-07-05 DIAGNOSIS — I2510 Atherosclerotic heart disease of native coronary artery without angina pectoris: Secondary | ICD-10-CM | POA: Diagnosis not present

## 2022-07-05 DIAGNOSIS — E785 Hyperlipidemia, unspecified: Secondary | ICD-10-CM | POA: Diagnosis not present

## 2022-07-05 DIAGNOSIS — K5730 Diverticulosis of large intestine without perforation or abscess without bleeding: Secondary | ICD-10-CM | POA: Diagnosis not present

## 2022-07-05 DIAGNOSIS — G931 Anoxic brain damage, not elsewhere classified: Secondary | ICD-10-CM | POA: Diagnosis not present

## 2022-07-05 DIAGNOSIS — M542 Cervicalgia: Secondary | ICD-10-CM | POA: Diagnosis not present

## 2022-07-05 DIAGNOSIS — E1142 Type 2 diabetes mellitus with diabetic polyneuropathy: Secondary | ICD-10-CM | POA: Diagnosis not present

## 2022-07-05 DIAGNOSIS — D631 Anemia in chronic kidney disease: Secondary | ICD-10-CM | POA: Diagnosis not present

## 2022-07-05 DIAGNOSIS — G894 Chronic pain syndrome: Secondary | ICD-10-CM | POA: Diagnosis not present

## 2022-07-05 DIAGNOSIS — E1122 Type 2 diabetes mellitus with diabetic chronic kidney disease: Secondary | ICD-10-CM | POA: Diagnosis not present

## 2022-07-05 DIAGNOSIS — J9611 Chronic respiratory failure with hypoxia: Secondary | ICD-10-CM | POA: Diagnosis not present

## 2022-07-05 DIAGNOSIS — J439 Emphysema, unspecified: Secondary | ICD-10-CM | POA: Diagnosis not present

## 2022-07-05 DIAGNOSIS — I252 Old myocardial infarction: Secondary | ICD-10-CM | POA: Diagnosis not present

## 2022-07-05 DIAGNOSIS — I5033 Acute on chronic diastolic (congestive) heart failure: Secondary | ICD-10-CM | POA: Diagnosis not present

## 2022-07-05 DIAGNOSIS — J9612 Chronic respiratory failure with hypercapnia: Secondary | ICD-10-CM | POA: Diagnosis not present

## 2022-07-05 DIAGNOSIS — J4489 Other specified chronic obstructive pulmonary disease: Secondary | ICD-10-CM | POA: Diagnosis not present

## 2022-07-05 DIAGNOSIS — M159 Polyosteoarthritis, unspecified: Secondary | ICD-10-CM | POA: Diagnosis not present

## 2022-07-06 ENCOUNTER — Other Ambulatory Visit: Payer: Self-pay | Admitting: Family Medicine

## 2022-07-06 ENCOUNTER — Ambulatory Visit: Payer: 59 | Admitting: Student

## 2022-07-06 DIAGNOSIS — K219 Gastro-esophageal reflux disease without esophagitis: Secondary | ICD-10-CM

## 2022-07-07 ENCOUNTER — Telehealth: Payer: Self-pay

## 2022-07-07 NOTE — Telephone Encounter (Signed)
Tiffany Gale, RN Kaitland Lewellyn,  [facility] was unable to reached this patient by phone and mailed,referral closed.  I called the pt and she says that she just got out of the hospital for her kidneys and has an apt 08/03/22.    In Epic she was in 06/21/22 to 06/24/22 for AKI.. she is seeing Dr Juel Burrow for post hospital.  Recommendations for Outpatient Follow-up:  Follow-up with nephrology Dr. Juel Burrow in 1 week Take torsemide 40 mg twice a day Avoid NSAIDs such as ibuprofen, Motrin, Aleve Compression stockings and low-sodium diet

## 2022-07-10 DIAGNOSIS — E785 Hyperlipidemia, unspecified: Secondary | ICD-10-CM | POA: Diagnosis not present

## 2022-07-10 DIAGNOSIS — J9612 Chronic respiratory failure with hypercapnia: Secondary | ICD-10-CM | POA: Diagnosis not present

## 2022-07-10 DIAGNOSIS — I5033 Acute on chronic diastolic (congestive) heart failure: Secondary | ICD-10-CM | POA: Diagnosis not present

## 2022-07-10 DIAGNOSIS — I2510 Atherosclerotic heart disease of native coronary artery without angina pectoris: Secondary | ICD-10-CM | POA: Diagnosis not present

## 2022-07-10 DIAGNOSIS — G4733 Obstructive sleep apnea (adult) (pediatric): Secondary | ICD-10-CM | POA: Diagnosis not present

## 2022-07-10 DIAGNOSIS — M542 Cervicalgia: Secondary | ICD-10-CM | POA: Diagnosis not present

## 2022-07-10 DIAGNOSIS — J9611 Chronic respiratory failure with hypoxia: Secondary | ICD-10-CM | POA: Diagnosis not present

## 2022-07-10 DIAGNOSIS — N184 Chronic kidney disease, stage 4 (severe): Secondary | ICD-10-CM | POA: Diagnosis not present

## 2022-07-10 DIAGNOSIS — I890 Lymphedema, not elsewhere classified: Secondary | ICD-10-CM | POA: Diagnosis not present

## 2022-07-10 DIAGNOSIS — J4489 Other specified chronic obstructive pulmonary disease: Secondary | ICD-10-CM | POA: Diagnosis not present

## 2022-07-10 DIAGNOSIS — M1030 Gout due to renal impairment, unspecified site: Secondary | ICD-10-CM | POA: Diagnosis not present

## 2022-07-10 DIAGNOSIS — D631 Anemia in chronic kidney disease: Secondary | ICD-10-CM | POA: Diagnosis not present

## 2022-07-10 DIAGNOSIS — J439 Emphysema, unspecified: Secondary | ICD-10-CM | POA: Diagnosis not present

## 2022-07-10 DIAGNOSIS — G894 Chronic pain syndrome: Secondary | ICD-10-CM | POA: Diagnosis not present

## 2022-07-10 DIAGNOSIS — E1142 Type 2 diabetes mellitus with diabetic polyneuropathy: Secondary | ICD-10-CM | POA: Diagnosis not present

## 2022-07-10 DIAGNOSIS — E1122 Type 2 diabetes mellitus with diabetic chronic kidney disease: Secondary | ICD-10-CM | POA: Diagnosis not present

## 2022-07-10 DIAGNOSIS — M159 Polyosteoarthritis, unspecified: Secondary | ICD-10-CM | POA: Diagnosis not present

## 2022-07-10 DIAGNOSIS — G931 Anoxic brain damage, not elsewhere classified: Secondary | ICD-10-CM | POA: Diagnosis not present

## 2022-07-10 DIAGNOSIS — I252 Old myocardial infarction: Secondary | ICD-10-CM | POA: Diagnosis not present

## 2022-07-10 DIAGNOSIS — E538 Deficiency of other specified B group vitamins: Secondary | ICD-10-CM | POA: Diagnosis not present

## 2022-07-10 DIAGNOSIS — I130 Hypertensive heart and chronic kidney disease with heart failure and stage 1 through stage 4 chronic kidney disease, or unspecified chronic kidney disease: Secondary | ICD-10-CM | POA: Diagnosis not present

## 2022-07-11 ENCOUNTER — Ambulatory Visit (INDEPENDENT_AMBULATORY_CARE_PROVIDER_SITE_OTHER): Payer: 59 | Admitting: *Deleted

## 2022-07-11 ENCOUNTER — Ambulatory Visit: Payer: Self-pay

## 2022-07-11 DIAGNOSIS — Z0000 Encounter for general adult medical examination without abnormal findings: Secondary | ICD-10-CM

## 2022-07-11 DIAGNOSIS — Z1231 Encounter for screening mammogram for malignant neoplasm of breast: Secondary | ICD-10-CM

## 2022-07-11 NOTE — Progress Notes (Signed)
Subjective:   ANGALA HILGERS is a 62 y.o. female who presents for an Initial Medicare Annual Wellness Visit.  Visit Complete: Virtual  I connected with  [name] on 07/11/22 by a audio enabled telemedicine application and verified that I am speaking with the correct person using two identifiers.  Patient Location: Home  Provider Location: Home Office  I discussed the limitations of evaluation and management by telemedicine. The patient expressed understanding and agreed to proceed.    Review of Systems     Cardiac Risk Factors include: advanced age (>2men, >35 women);diabetes mellitus;obesity (BMI >30kg/m2)   Nutrition Risk Assessment:  Has the patient had any N/V/D within the last 2 months?  No  Does the patient have any non-healing wounds?  No  Has the patient had any unintentional weight loss or weight gain?  No   Diabetes:  Is the patient diabetic?  Yes  If diabetic, was a CBG obtained today?  No  Did the patient bring in their glucometer from home?  No  How often do you monitor your CBG's? Patient .   Financial Strains and Diabetes Management:  Are you having any financial strains with the device, your supplies or your medication? No .  Does the patient want to be seen by Chronic Care Management for management of their diabetes?  No  Would the patient like to be referred to a Nutritionist or for Diabetic Management?  No   Diabetic Exams:  Diabetic Eye Exam: Pt has been advised about the importance in completing this exam. A referral has been placed today. Message sent to referral coordinator for scheduling purposes. Advised pt to expect a call from office referred to regarding appt.       Objective:    Today's Vitals   There is no height or weight on file to calculate BMI.     07/11/2022    1:36 PM 06/10/2022    3:47 PM 06/10/2022   12:10 PM 06/09/2022    3:48 PM 06/03/2022   12:00 AM 06/02/2022    4:41 PM 04/03/2022    1:40 PM  Advanced  Directives  Does Patient Have a Medical Advance Directive? No No No No No No No  Would patient like information on creating a medical advance directive? No - Patient declined No - Patient declined  No - Patient declined       Current Medications (verified) Outpatient Encounter Medications as of 07/11/2022  Medication Sig   albuterol (VENTOLIN HFA) 108 (90 Base) MCG/ACT inhaler Inhale 2 puffs into the lungs every 6 (six) hours as needed for wheezing or shortness of breath.   amLODipine (NORVASC) 5 MG tablet Take 5 mg by mouth daily.   atorvastatin (LIPITOR) 20 MG tablet Take 1 tablet (20 mg total) by mouth daily.   carvedilol (COREG) 12.5 MG tablet Take 1 tablet (12.5 mg total) by mouth 2 (two) times daily.   cyclobenzaprine (FLEXERIL) 10 MG tablet Take 10 mg by mouth 3 (three) times daily.   dicyclomine (BENTYL) 10 MG capsule Take 10 mg by mouth 3 (three) times daily.   DULoxetine (CYMBALTA) 30 MG capsule Take 30 mg by mouth daily.   escitalopram (LEXAPRO) 20 MG tablet TAKE 1 TABLET(20 MG) BY MOUTH DAILY (Patient taking differently: Take 20 mg by mouth daily.)   ferrous sulfate 325 (65 FE) MG tablet Take 1 tablet (325 mg total) by mouth every other day.   Fluticasone-Umeclidin-Vilant (TRELEGY ELLIPTA) 100-62.5-25 MCG/ACT AEPB INHALE 1 PUFF INTO  THE LUNGS DAILY   folic acid (FOLVITE) 1 MG tablet Take 1 tablet (1 mg total) by mouth daily.   lamoTRIgine (LAMICTAL) 100 MG tablet Take 100 mg by mouth 3 (three) times daily.   loperamide (IMODIUM) 2 MG capsule Take 2 mg by mouth 2 (two) times daily.   OLANZapine (ZYPREXA) 10 MG tablet Take 10 mg by mouth at bedtime.   omeprazole (PRILOSEC) 40 MG capsule Take 1 capsule (40 mg total) by mouth every morning. (Patient taking differently: Take 40 mg by mouth daily before breakfast.)   oxyCODONE-acetaminophen (PERCOCET/ROXICET) 5-325 MG tablet Take 1 tablet by mouth daily as needed for severe pain.   potassium chloride SA (KLOR-CON M) 20 MEQ tablet Take 20  mEq by mouth daily.   prazosin (MINIPRESS) 1 MG capsule Take 2 capsules (2 mg total) by mouth at bedtime.   pregabalin (LYRICA) 100 MG capsule Take 100 mg by mouth 2 (two) times daily.   SAPHRIS 5 MG SUBL 24 hr tablet Place 5 mg under the tongue 2 (two) times daily.   torsemide 40 MG TABS Take 40 mg by mouth 2 (two) times daily.   No facility-administered encounter medications on file as of 07/11/2022.    Allergies (verified) Asa [aspirin], Hydrocodone, Latuda [lurasidone hcl], Magnesium-containing compounds, Prednisone, Ultram [tramadol], Codeine, Desyrel [trazodone], Hydralazine, Peanut (diagnostic), Topamax [topiramate], Sulfa antibiotics, Tape, and Zetia [ezetimibe]   History: Past Medical History:  Diagnosis Date   Abdominal pain    Acute encephalopathy 11/21/2017   Acute exacerbation of CHF (congestive heart failure) (HCC) 03/03/2022   Acute GI bleeding 03/29/2021   Acute kidney injury superimposed on chronic kidney disease (HCC) 02/18/2020   Acute metabolic encephalopathy 02/20/2020   Acute on chronic diastolic heart failure (HCC) 02/04/2022   Acute on chronic renal failure (HCC) 06/02/2022   Aggressive behavior    Agitation 11/22/2017   Anoxic brain injury (HCC) 09/08/2016   C. Arrest due to respiratory failure and COPD exacerbation   Anxiety    Arthritis    "all over" (04/10/2016)   Asthma 10/18/2010   Binge eating disorder    Blood loss anemia 04/24/2011   CAP (community acquired pneumonia) 06/22/2015   Cardiac arrest (HCC) 09/08/2016   PEA   Carotid artery stenosis    1-39% bilateral by dopplers 11/2016   Chronic diastolic (congestive) heart failure (HCC)    Chronic kidney disease, stage 3 (HCC)    Chronic kidney disease, stage 3b (HCC) 06/06/2021   Chronic pain syndrome 06/18/2012   Chronic post-traumatic stress disorder (PTSD) 05/27/2018   Chronic respiratory failure with hypoxia and hypercapnia (HCC) 06/22/2015   TRILOGY Vent >AVAPA-ES., Vt target 200-400, Max  P 30 , PS max 20 , PS min 6-10 , E Max 6, E Min 4, Rate Auto AVAPS Rate 2 (titrate for pt comfort) , bleed O2 at 5l/m continuous flow .    CKD (chronic kidney disease) 02/03/2022   Closed displaced fracture of fifth metacarpal bone 03/21/2018   Cocaine use disorder, severe, in sustained remission (HCC) 12/17/2015   Complication of anesthesia    decreased bp, decreased heart rate   COPD (chronic obstructive pulmonary disease) (HCC) 07/08/2014   Delusional disorder, persecutory type (HCC) 06/26/2021   Depression    Diabetic neuropathy (HCC) 04/24/2011   Difficulty with speech 01/24/2018   Disorder of nervous system    Drug abuse (HCC) 11/21/2017   Dyslipidemia 04/24/2011   E. coli UTI 02/20/2020   Elevated troponin 04/28/2012   Emphysema  Encephalopathy 11/21/2017   Essential hypertension 03/22/2016   Facet arthropathy, lumbar 06/12/2022   Fibula fracture 07/10/2016   Frequent falls 10/11/2017   GERD (gastroesophageal reflux disease)    GI bleed 03/30/2021   Gout 04/11/2017   Heart attack (HCC) 1980s   Hematochezia    History of blood transfusion 1994   "couldn't stop bleeding from my period"   History of drug abuse in remission (HCC) 11/28/2015   Quit in 2017   Hyperlipidemia 03/31/2021   Hyperlipidemia LDL goal <70    Hypocalcemia    Hypokalemia    Hypomagnesemia    Incontinence    Manic depression (HCC)    Morbid obesity (HCC) 10/18/2010   Obstructive sleep apnea 10/18/2010   On home oxygen therapy    "6L; 24/7" (04/10/2016)   OSA on CPAP    "wear mask sometimes" (04/10/2016)   Painless rectal bleeding 12/31/2015   Paranoid (HCC)    "sometimes; I'm on RX for it" (04/10/2016)   Periumbilical abdominal pain    Prolonged Q-T interval on ECG    Rectal bleeding 12/31/2015   Recurrent syncope 07/09/2021   Rhabdomyolysis 06/16/2021   Schizoaffective disorder, bipolar type (HCC) 04/05/2018   Seasonal allergies    Seborrheic keratoses 12/31/2013   Seizures (HCC)     "don't know what kind; last one was ~ 1 yr ago" (04/10/2016)   Sinus bradycardia    Skin ulcer of sacrum, limited to breakdown of skin (HCC) 03/08/2022   Spondylolisthesis at L3-L4 level 06/12/2022   Stroke (HCC) 1980s   denies residual on 04/10/2016   Thrush 09/19/2013   Type 2 diabetes mellitus (HCC) 10/18/2010   Past Surgical History:  Procedure Laterality Date   CESAREAN SECTION  1997   COLONOSCOPY WITH PROPOFOL N/A 04/01/2021   Procedure: COLONOSCOPY WITH PROPOFOL;  Surgeon: Jeani Hawking, MD;  Location: [facility] ENDOSCOPY;  Service: Gastroenterology;  Laterality: N/A;  Rectal bleeding with drop in hemoglobin to 7.2 g/dL   HERNIA REPAIR     IR CHOLANGIOGRAM EXISTING TUBE  07/20/2016   IR PERC CHOLECYSTOSTOMY  05/10/2016   IR RADIOLOGIST EVAL & MGMT  06/08/2016   IR RADIOLOGIST EVAL & MGMT  06/29/2016   IR SINUS/FIST TUBE CHK-NON GI  07/12/2016   RIGHT/LEFT HEART CATH AND CORONARY ANGIOGRAPHY N/A 06/19/2017   Procedure: RIGHT/LEFT HEART CATH AND CORONARY ANGIOGRAPHY;  Surgeon: Dolores Patty, MD;  Location: [facility] INVASIVE CV LAB;  Service: Cardiovascular;  Laterality: N/A;   TIBIA IM NAIL INSERTION Right 07/12/2016   Procedure: INTRAMEDULLARY (IM) NAIL RIGHT TIBIA;  Surgeon: Tarry Kos, MD;  Location: [facility] OR;  Service: Orthopedics;  Laterality: Right;   UMBILICAL HERNIA REPAIR  ~ 1963   "that's why I don't have a belly button"   VAGINAL HYSTERECTOMY     Family History  Problem Relation Age of Onset   Cancer Mother        lung   Depression Mother    Cancer Father        prostate   Depression Sister    Anxiety disorder Sister    Schizophrenia Sister    Bipolar disorder Sister    Depression Sister    Depression Brother    Heart failure Other        cousin   Social History   Socioeconomic History   Marital status: Widowed    Spouse name: Not on file   Number of children: 3   Years of education: 12th   Highest education level: 12th grade  Occupational History   Occupation:  disabled    Comment: factory production  Tobacco Use   Smoking status: Former    Packs/day: 1.50    Years: 38.00    Additional pack years: 0.00    Total pack years: 57.00    Types: Cigarettes    Start date: 03/13/1977    Quit date: 04/10/2016    Years since quitting: 6.2    Passive exposure: Past   Smokeless tobacco: Never  Vaping Use   Vaping Use: Never used  Substance and Sexual Activity   Alcohol use: No    Alcohol/week: 0.0 standard drinks of alcohol   Drug use: Not Currently    Types: Cocaine    Comment: 04/10/2016 "last used cocaine back in November 2017"   Sexual activity: Not Currently    Birth control/protection: Surgical  Other Topics Concern   Not on file  Social History Narrative   Has 1 son, [name]   Lives with son and his boyfriend   Her house has ramps and handrails should she ever needs them.    Her mother lives down the street from her and is a good support person in addition to her son.   She drives herself, has private transportation.    Cocaine free since 02/24/16, smoke free since 04/10/16   Social Determinants of Health   Financial Resource Strain: Low Risk  (07/11/2022)   Overall Financial Resource Strain (CARDIA)    Difficulty of Paying Living Expenses: Not very hard  Food Insecurity: Food Insecurity Present (07/11/2022)   Hunger Vital Sign    Worried About Running Out of Food in the Last Year: Sometimes true    Ran Out of Food in the Last Year: Sometimes true  Transportation Needs: No Transportation Needs (06/15/2022)   PRAPARE - Administrator, Civil Service (Medical): No    Lack of Transportation (Non-Medical): No  Physical Activity: Inactive (07/11/2022)   Exercise Vital Sign    Days of Exercise per Week: 0 days    Minutes of Exercise per Session: 0 min  Stress: Stress Concern Present (07/11/2022)   [employer] of Occupational Health - Occupational Stress Questionnaire    Feeling of Stress : To some extent  Social Connections:  Socially Isolated (07/11/2022)   Social Connection and Isolation Panel [NHANES]    Frequency of Communication with Friends and Family: Three times a week    Frequency of Social Gatherings with Friends and Family: Three times a week    Attends Religious Services: Never    Active Member of Clubs or Organizations: No    Attends Banker Meetings: Never    Marital Status: Widowed    Tobacco Counseling Counseling given: Not Answered   Clinical Intake:  Pre-visit preparation completed: Yes  Pain : No/denies pain     Diabetes: Yes CBG done?: No Did pt. bring in CBG monitor from home?: No  How often do you need to have someone help you when you read instructions, pamphlets, or other written materials from your doctor or pharmacy?: 1 - Never  Interpreter Needed?: No  Information entered by :: Remi Haggard LPN   Activities of Daily Living    07/11/2022    1:37 PM 06/10/2022    3:51 PM  In your present state of health, do you have any difficulty performing the following activities:  Hearing? 0   Vision? 0   Difficulty concentrating or making decisions? 0   Walking or climbing stairs? 1  Dressing or bathing? 0   Doing errands, shopping? 0 0  Preparing Food and eating ? N   Using the Toilet? N   In the past six months, have you accidently leaked urine? Y   Do you have problems with loss of bowel control? N   Managing your Medications? N   Managing your Finances? N   Housekeeping or managing your Housekeeping? Y     Patient Care Team: Tiffany Kocher, DO as PCP - General (Family Medicine) Dr. Marquette Old (Family Medicine) Melancon, Hillery Hunter, MD (Inactive) as Resident (Family Medicine) Pricilla Riffle, MD as Consulting Physician (Cardiology) Juanell Fairly, RN as [facility] Care Management  Indicate any recent Medical Services you may have received from other than [facility] providers in the past year (date may be approximate).     Assessment:   This is  a routine wellness examination for [place].  Hearing/Vision screen Hearing Screening - Comments:: No trouble hearing Vision Screening - Comments:: [facility] Up to date  Dietary issues and exercise activities discussed:     Goals Addressed             This Visit's Progress    Patient Stated       Would like to walk       Depression Screen    07/11/2022    1:42 PM 06/09/2022    3:51 PM 04/03/2022    1:40 PM 03/14/2022    9:18 AM 03/07/2022    4:02 PM 02/27/2022   11:23 AM 02/24/2022    2:50 PM  PHQ 2/9 Scores  PHQ - 2 Score 1 0 0 0 2 4 6   PHQ- 9 Score 2 3 6  0 13 20 13     Fall Risk    07/11/2022    1:36 PM 07/11/2022   10:18 AM 06/09/2022    3:48 PM 07/21/2021    3:38 PM 06/23/2021    2:15 PM  Fall Risk   Falls in the past year? 1 1 1 1 1   Number falls in past yr: 1 1 1 1 1   Injury with Fall? 1 0 0 1 1  Risk for fall due to : History of fall(s)   History of fall(s);Impaired balance/gait;Impaired mobility;Mental status change History of fall(s);Impaired balance/gait;Impaired mobility  Risk for fall due to: Comment  during transsfer from bed to [facility] wheelchair     Follow up Falls evaluation completed;Education provided;Falls prevention discussed Falls evaluation completed;Falls prevention discussed  Falls evaluation completed;Education provided;Falls prevention discussed Education provided;Falls evaluation completed    MEDICARE RISK AT HOME:   TIMED UP AND GO:  Was the test performed? No    Cognitive Function:        07/11/2022    1:38 PM  6CIT Screen  What Year? 0 points  What month? 0 points  What time? 0 points  Count back from 20 0 points  Months in reverse 4 points  Repeat phrase 8 points  Total Score 12 points    Immunizations Immunization History  Administered Date(s) Administered   COVID-19, mRNA, vaccine(Comirnaty)12 years and older 01/05/2022   Influenza,inj,Quad PF,6+ Mos 09/19/2013   Pneumococcal Polysaccharide-23 09/19/2013   Tdap 09/22/2015     TDAP status: Up to date  Flu Vaccine status: Up to date  Pneumococcal vaccine status: Due, Education has been provided regarding the importance of this vaccine. Advised may receive this vaccine at local pharmacy or Health Dept. Aware to provide a copy of the vaccination record if obtained from  local pharmacy or Health Dept. Verbalized acceptance and understanding.  Covid-19 vaccine status: Information provided on how to obtain vaccines.   Qualifies for Shingles Vaccine? Yes   Zostavax completed No   Shingrix Completed?: No.    Education has been provided regarding the importance of this vaccine. Patient has been advised to call insurance company to determine out of pocket expense if they have not yet received this vaccine. Advised may also receive vaccine at local pharmacy or Health Dept. Verbalized acceptance and understanding.  Screening Tests Health Maintenance  Topic Date Due   FOOT EXAM  03/12/2018   OPHTHALMOLOGY EXAM  04/20/2018   MAMMOGRAM  06/30/2019   COVID-19 Vaccine (2 - Pfizer risk series) 01/26/2022   COLON CANCER SCREENING ANNUAL FOBT  05/20/2022   HEMOGLOBIN A1C  07/07/2022   Zoster Vaccines- Shingrix (1 of 2) 10/11/2022 (Originally 05/11/1979)   INFLUENZA VACCINE  08/17/2022   Lung Cancer Screening  02/04/2023   Diabetic kidney evaluation - Urine ACR  06/05/2023   Diabetic kidney evaluation - eGFR measurement  06/24/2023   Medicare Annual Wellness (AWV)  07/11/2023   DTaP/Tdap/Td (2 - Td or Tdap) 09/21/2025   Colonoscopy  04/02/2031   Hepatitis C Screening  Completed   HIV Screening  Completed   HPV VACCINES  Aged Out    Health Maintenance  Health Maintenance Due  Topic Date Due   FOOT EXAM  03/12/2018   OPHTHALMOLOGY EXAM  04/20/2018   MAMMOGRAM  06/30/2019   COVID-19 Vaccine (2 - Pfizer risk series) 01/26/2022   COLON CANCER SCREENING ANNUAL FOBT  05/20/2022   HEMOGLOBIN A1C  07/07/2022    Colorectal cancer screening: Type of screening:  Colonoscopy. Completed 2023. Repeat every 10 years  Mammogram status: Ordered  . Pt provided with contact info and advised to call to schedule appt.   Bone Density declined  Lung Cancer Screening: (Low Dose CT Chest recommended if Age 71-80 years, 20 pack-year currently smoking OR have quit w/in 15years.) does qualify.   Lung Cancer Screening Referral: ordered  Additional Screening:  Hepatitis C Screening: does not qualify; Completed 2024  Vision Screening: Recommended annual ophthalmology exams for early detection of glaucoma and other disorders of the eye. Is the patient up to date with their annual eye exam?  Yes  Who is the provider or what is the name of the office in which the patient attends annual eye exams? [facility] If pt is not established with a provider, would they like to be referred to a provider to establish care? No .   Dental Screening: Recommended annual dental exams for proper oral hygiene  Diabetic Foot Exam overdue  Community Resource Referral / Chronic Care Management: CRR required this visit?  No   CCM required this visit?  No     Plan:     I have personally reviewed and noted the following in the patient's chart:   Medical and social history Use of alcohol, tobacco or illicit drugs  Current medications and supplements including opioid prescriptions. Patient is not currently taking opioid prescriptions. Functional ability and status Nutritional status Physical activity Advanced directives List of other physicians Hospitalizations, surgeries, and ER visits in previous 12 months Vitals Screenings to include cognitive, depression, and falls Referrals and appointments  In addition, I have reviewed and discussed with patient certain preventive protocols, quality metrics, and best practice recommendations. A written personalized care plan for preventive services as well as general preventive health recommendations were provided to patient.  Remi Haggard, LPN   1/[id number]/[id number]   After Visit Summary: (Declined) Due to this being a telephonic visit, with patients personalized plan was offered to patient but patient Declined AVS at this time   Nurse Notes:

## 2022-07-12 DIAGNOSIS — E785 Hyperlipidemia, unspecified: Secondary | ICD-10-CM | POA: Diagnosis not present

## 2022-07-12 DIAGNOSIS — I2510 Atherosclerotic heart disease of native coronary artery without angina pectoris: Secondary | ICD-10-CM | POA: Diagnosis not present

## 2022-07-12 DIAGNOSIS — J9611 Chronic respiratory failure with hypoxia: Secondary | ICD-10-CM | POA: Diagnosis not present

## 2022-07-12 DIAGNOSIS — M159 Polyosteoarthritis, unspecified: Secondary | ICD-10-CM | POA: Diagnosis not present

## 2022-07-12 DIAGNOSIS — I130 Hypertensive heart and chronic kidney disease with heart failure and stage 1 through stage 4 chronic kidney disease, or unspecified chronic kidney disease: Secondary | ICD-10-CM | POA: Diagnosis not present

## 2022-07-12 DIAGNOSIS — I252 Old myocardial infarction: Secondary | ICD-10-CM | POA: Diagnosis not present

## 2022-07-12 DIAGNOSIS — J9612 Chronic respiratory failure with hypercapnia: Secondary | ICD-10-CM | POA: Diagnosis not present

## 2022-07-12 DIAGNOSIS — E538 Deficiency of other specified B group vitamins: Secondary | ICD-10-CM | POA: Diagnosis not present

## 2022-07-12 DIAGNOSIS — M542 Cervicalgia: Secondary | ICD-10-CM | POA: Diagnosis not present

## 2022-07-12 DIAGNOSIS — G931 Anoxic brain damage, not elsewhere classified: Secondary | ICD-10-CM | POA: Diagnosis not present

## 2022-07-12 DIAGNOSIS — E1142 Type 2 diabetes mellitus with diabetic polyneuropathy: Secondary | ICD-10-CM | POA: Diagnosis not present

## 2022-07-12 DIAGNOSIS — D631 Anemia in chronic kidney disease: Secondary | ICD-10-CM | POA: Diagnosis not present

## 2022-07-12 DIAGNOSIS — I5033 Acute on chronic diastolic (congestive) heart failure: Secondary | ICD-10-CM | POA: Diagnosis not present

## 2022-07-12 DIAGNOSIS — M1030 Gout due to renal impairment, unspecified site: Secondary | ICD-10-CM | POA: Diagnosis not present

## 2022-07-12 DIAGNOSIS — G4733 Obstructive sleep apnea (adult) (pediatric): Secondary | ICD-10-CM | POA: Diagnosis not present

## 2022-07-12 DIAGNOSIS — I890 Lymphedema, not elsewhere classified: Secondary | ICD-10-CM | POA: Diagnosis not present

## 2022-07-12 DIAGNOSIS — N184 Chronic kidney disease, stage 4 (severe): Secondary | ICD-10-CM | POA: Diagnosis not present

## 2022-07-12 DIAGNOSIS — J4489 Other specified chronic obstructive pulmonary disease: Secondary | ICD-10-CM | POA: Diagnosis not present

## 2022-07-12 DIAGNOSIS — E1122 Type 2 diabetes mellitus with diabetic chronic kidney disease: Secondary | ICD-10-CM | POA: Diagnosis not present

## 2022-07-12 DIAGNOSIS — J439 Emphysema, unspecified: Secondary | ICD-10-CM | POA: Diagnosis not present

## 2022-07-12 DIAGNOSIS — G894 Chronic pain syndrome: Secondary | ICD-10-CM | POA: Diagnosis not present

## 2022-07-12 NOTE — Patient Outreach (Signed)
Care Coordination   Initial Visit Note   07/12/2022 Name: Tiffany [name] MRN: [id number] DOB: 1960/06/01  Tiffany [name] is a 62 y.o. year old female who sees Tiffany [name], [place] for primary care. I spoke with  Tiffany [name] by phone today.  What matters to the patients health and wellness today?  I had a conversation with Tiffany [name], who lives alone. She mentioned that she uses a wheelchair and has a Scientist, product/process development to assist her during the day, while her mother stays with her at night. Tiffany [name] also shared that she has experienced falls in the past while transferring herself. Although her new wheelchair locks better than the previous one, she still feels anxious about falling. [name] interested in obtaining a medical alert bracelet and would like to explore this option. I will send a referral to my colleague Bevelyn Ngo to see if she can assist. We made a call together to the nephrology office to see if we could get her seen before July 18th.    Goals Addressed             This Visit's Progress    I want to monitor and maintain my health        Last practice recorded BP readings:  BP Readings from Last 3 Encounters:  06/24/22 (!) 166/86  06/13/22 (!) 148/92  06/09/22 126/72  Most recent eGFR/CrCl:  Lab Results  Component Value Date   EGFR 11 (L) 05/19/2022    No components found for: "CRCL"  Evaluation of current treatment plan related to chronic kidney disease self management and patient's adherence to plan as established by provider      Reviewed medications with patient and discussed importance of compliance    Reviewed scheduled/upcoming provider appointments including -  Schedule an appointment with Ethelene Hal, MD as soon as possible for a visit in 1 week(s) Specialty: Nephrology [facility] [address] [place] [address] [phone] [facility] called with the patient and spoke with the patient to see about getting an appointment.  I spoke with  Tiffany [name] and she stated that they spoke with the patient earlier nd told her that they did not have any appointments available until 08/03/22 but for her to go to the ER if she had any urgent symptoms. Discussed plans with patient for ongoing care management follow up and provided patient with direct contact information for care management team     Patient Goals/Self-Care Activities: Take medications as prescribed   Attend all scheduled provider appointments Call pharmacy for medication refills 3-7 days in advance of running out of medications Call provider office for new concerns or questions           SDOH assessments and interventions completed:  Yes  SDOH Interventions Today    Flowsheet Row Most Recent Value  SDOH Interventions   Food Insecurity Interventions Intervention Not Indicated  Transportation Interventions Intervention Not Indicated        Care Coordination Interventions:  Yes, provided   Interventions Today    Flowsheet Row Most Recent Value  Chronic Disease   Chronic disease during today's visit Other  [CKD stage 5]  General Interventions   General Interventions Discussed/Reviewed General Interventions Discussed, General Interventions Reviewed  Communication with PCP/Specialists  Education Interventions   Education Provided Provided Education  Pharmacy Interventions   Pharmacy Dicussed/Reviewed Medications and their functions  Safety Interventions   Safety Discussed/Reviewed Safety Discussed        Follow  up plan: Follow up call scheduled for 08/02/22 930 am    Encounter Outcome:  Pt. Visit Completed   Juanell Fairly RN, BSN, [facility] Care Coordinator [facility]   Phone: [phone]

## 2022-07-12 NOTE — Patient Instructions (Signed)
Visit Information  Thank you for taking time to visit with me today. Please don't hesitate to contact me if I can be of assistance to you.   Following are the goals we discussed today:   Goals Addressed             This Visit's Progress    I want to monitor and maintain my health        Last practice recorded BP readings:  BP Readings from Last 3 Encounters:  06/24/22 (!) 166/86  06/13/22 (!) 148/92  06/09/22 126/72  Most recent eGFR/CrCl:  Lab Results  Component Value Date   EGFR 11 (L) 05/19/2022    No components found for: "CRCL"  Evaluation of current treatment plan related to chronic kidney disease self management and patient's adherence to plan as established by provider      Reviewed medications with patient and discussed importance of compliance    Reviewed scheduled/upcoming provider appointments including -  Schedule an appointment with Ethelene Hal, MD as soon as possible for a visit in 1 week(s) Specialty: Nephrology [facility] [address] [place] [address] [phone] [facility] called with the patient and spoke with the patient to see about getting an appointment.  I spoke with Destiny and she stated that they spoke with the patient earlier nd told her that they did not have any appointments available until 08/03/22 but for her to go to the ER if she had any urgent symptoms. Discussed plans with patient for ongoing care management follow up and provided patient with direct contact information for care management team     Patient Goals/Self-Care Activities: Take medications as prescribed   Attend all scheduled provider appointments Call pharmacy for medication refills 3-7 days in advance of running out of medications Call provider office for new concerns or questions           Our next appointment is by telephone on 08/02/22 at 930 am  Please call the care guide team at [phone] if you need to cancel or reschedule your appointment.   If  you are experiencing a Mental Health or Behavioral Health Crisis or need someone to talk to, please call 1-800-273-TALK (toll free, 24 hour hotline)  The patient verbalized understanding of instructions, educational materials, and care plan provided today and agreed to receive a mailed copy of patient instructions, educational materials, and care plan.   Juanell Fairly RN, BSN, [facility] Care Coordinator [facility]   Phone: [phone]

## 2022-07-13 ENCOUNTER — Telehealth: Payer: Self-pay

## 2022-07-13 DIAGNOSIS — D631 Anemia in chronic kidney disease: Secondary | ICD-10-CM | POA: Diagnosis not present

## 2022-07-13 DIAGNOSIS — M109 Gout, unspecified: Secondary | ICD-10-CM | POA: Diagnosis not present

## 2022-07-13 DIAGNOSIS — I120 Hypertensive chronic kidney disease with stage 5 chronic kidney disease or end stage renal disease: Secondary | ICD-10-CM | POA: Diagnosis not present

## 2022-07-13 DIAGNOSIS — N185 Chronic kidney disease, stage 5: Secondary | ICD-10-CM | POA: Diagnosis not present

## 2022-07-13 DIAGNOSIS — N1832 Chronic kidney disease, stage 3b: Secondary | ICD-10-CM | POA: Diagnosis not present

## 2022-07-13 DIAGNOSIS — E1322 Other specified diabetes mellitus with diabetic chronic kidney disease: Secondary | ICD-10-CM | POA: Diagnosis not present

## 2022-07-13 DIAGNOSIS — E876 Hypokalemia: Secondary | ICD-10-CM | POA: Diagnosis not present

## 2022-07-13 NOTE — Patient Outreach (Signed)
  Care Coordination   Follow Up Visit Note   07/13/2022 late entry Name: Tiffany [name] MRN: [id number] DOB: 1960-02-20  Tiffany [name] is a 62 y.o. year old female who sees Tiffany Kocher, [place] for primary care. I spoke with  [name] by phone today.  What matters to the patients health and wellness today?  I spoke with [name] and informed her that I sent a message to Dr. Juel Burrow to try to get her an appointment before July 18th. She told me that she waas called and managed to schedule an appointment for June 27th, 2024, at 3 p.m. She thanked me for the assistance and mentioned that her legs were starting to swell again.     SDOH assessments and interventions completed:  No     Care Coordination Interventions:  Yes, provided   Interventions Today    Flowsheet Row Most Recent Value  General Interventions   General Interventions Discussed/Reviewed General Interventions Discussed  Communication with PCP/Specialists        Follow up plan: Follow up call scheduled for next scheduled interval    Encounter Outcome:  Pt. Visit Completed   Juanell Fairly RN, BSN, [facility] Care Coordinator [facility]   Phone: [phone]

## 2022-07-14 ENCOUNTER — Telehealth: Payer: Self-pay

## 2022-07-14 NOTE — Patient Outreach (Signed)
  Care Coordination   07/14/2022 Name: Tiffany [name] MRN: [id number] DOB: 04-24-1960   Care Coordination Outreach Attempts:  An unsuccessful telephone outreach was attempted today to offer the patient information about available care coordination services.  Follow Up Plan:  Additional outreach attempts will be made to offer the patient care coordination information and services.   Encounter Outcome:  No Answer   Care Coordination Interventions:  No, not indicated    Bevelyn Ngo, BSW, CDP Social Worker, Certified Dementia Practitioner [facility] Care Management  Care Coordination [phone]

## 2022-07-17 DIAGNOSIS — N179 Acute kidney failure, unspecified: Secondary | ICD-10-CM | POA: Diagnosis not present

## 2022-07-17 DIAGNOSIS — I509 Heart failure, unspecified: Secondary | ICD-10-CM | POA: Diagnosis not present

## 2022-07-18 NOTE — Telephone Encounter (Signed)
Scheduled 07/21/22  Tiffany [name]  Care Coordination Care Guide  Direct Dial: [phone]

## 2022-07-19 DIAGNOSIS — I130 Hypertensive heart and chronic kidney disease with heart failure and stage 1 through stage 4 chronic kidney disease, or unspecified chronic kidney disease: Secondary | ICD-10-CM | POA: Diagnosis not present

## 2022-07-19 DIAGNOSIS — G894 Chronic pain syndrome: Secondary | ICD-10-CM | POA: Diagnosis not present

## 2022-07-19 DIAGNOSIS — G4733 Obstructive sleep apnea (adult) (pediatric): Secondary | ICD-10-CM | POA: Diagnosis not present

## 2022-07-19 DIAGNOSIS — M159 Polyosteoarthritis, unspecified: Secondary | ICD-10-CM | POA: Diagnosis not present

## 2022-07-19 DIAGNOSIS — J9612 Chronic respiratory failure with hypercapnia: Secondary | ICD-10-CM | POA: Diagnosis not present

## 2022-07-19 DIAGNOSIS — M542 Cervicalgia: Secondary | ICD-10-CM | POA: Diagnosis not present

## 2022-07-19 DIAGNOSIS — E785 Hyperlipidemia, unspecified: Secondary | ICD-10-CM | POA: Diagnosis not present

## 2022-07-19 DIAGNOSIS — E1142 Type 2 diabetes mellitus with diabetic polyneuropathy: Secondary | ICD-10-CM | POA: Diagnosis not present

## 2022-07-19 DIAGNOSIS — M1030 Gout due to renal impairment, unspecified site: Secondary | ICD-10-CM | POA: Diagnosis not present

## 2022-07-19 DIAGNOSIS — J439 Emphysema, unspecified: Secondary | ICD-10-CM | POA: Diagnosis not present

## 2022-07-19 DIAGNOSIS — I890 Lymphedema, not elsewhere classified: Secondary | ICD-10-CM | POA: Diagnosis not present

## 2022-07-19 DIAGNOSIS — N184 Chronic kidney disease, stage 4 (severe): Secondary | ICD-10-CM | POA: Diagnosis not present

## 2022-07-19 DIAGNOSIS — J9611 Chronic respiratory failure with hypoxia: Secondary | ICD-10-CM | POA: Diagnosis not present

## 2022-07-19 DIAGNOSIS — J4489 Other specified chronic obstructive pulmonary disease: Secondary | ICD-10-CM | POA: Diagnosis not present

## 2022-07-19 DIAGNOSIS — E1122 Type 2 diabetes mellitus with diabetic chronic kidney disease: Secondary | ICD-10-CM | POA: Diagnosis not present

## 2022-07-19 DIAGNOSIS — D631 Anemia in chronic kidney disease: Secondary | ICD-10-CM | POA: Diagnosis not present

## 2022-07-19 DIAGNOSIS — I252 Old myocardial infarction: Secondary | ICD-10-CM | POA: Diagnosis not present

## 2022-07-19 DIAGNOSIS — I2510 Atherosclerotic heart disease of native coronary artery without angina pectoris: Secondary | ICD-10-CM | POA: Diagnosis not present

## 2022-07-19 DIAGNOSIS — G931 Anoxic brain damage, not elsewhere classified: Secondary | ICD-10-CM | POA: Diagnosis not present

## 2022-07-19 DIAGNOSIS — E538 Deficiency of other specified B group vitamins: Secondary | ICD-10-CM | POA: Diagnosis not present

## 2022-07-19 DIAGNOSIS — I5033 Acute on chronic diastolic (congestive) heart failure: Secondary | ICD-10-CM | POA: Diagnosis not present

## 2022-07-20 DIAGNOSIS — G931 Anoxic brain damage, not elsewhere classified: Secondary | ICD-10-CM | POA: Diagnosis not present

## 2022-07-20 DIAGNOSIS — N184 Chronic kidney disease, stage 4 (severe): Secondary | ICD-10-CM | POA: Diagnosis not present

## 2022-07-20 DIAGNOSIS — E1122 Type 2 diabetes mellitus with diabetic chronic kidney disease: Secondary | ICD-10-CM | POA: Diagnosis not present

## 2022-07-20 DIAGNOSIS — M1030 Gout due to renal impairment, unspecified site: Secondary | ICD-10-CM | POA: Diagnosis not present

## 2022-07-20 DIAGNOSIS — E1142 Type 2 diabetes mellitus with diabetic polyneuropathy: Secondary | ICD-10-CM | POA: Diagnosis not present

## 2022-07-20 DIAGNOSIS — E785 Hyperlipidemia, unspecified: Secondary | ICD-10-CM | POA: Diagnosis not present

## 2022-07-20 DIAGNOSIS — D631 Anemia in chronic kidney disease: Secondary | ICD-10-CM | POA: Diagnosis not present

## 2022-07-20 DIAGNOSIS — J4489 Other specified chronic obstructive pulmonary disease: Secondary | ICD-10-CM | POA: Diagnosis not present

## 2022-07-20 DIAGNOSIS — I252 Old myocardial infarction: Secondary | ICD-10-CM | POA: Diagnosis not present

## 2022-07-20 DIAGNOSIS — I5033 Acute on chronic diastolic (congestive) heart failure: Secondary | ICD-10-CM | POA: Diagnosis not present

## 2022-07-20 DIAGNOSIS — M159 Polyosteoarthritis, unspecified: Secondary | ICD-10-CM | POA: Diagnosis not present

## 2022-07-20 DIAGNOSIS — J9611 Chronic respiratory failure with hypoxia: Secondary | ICD-10-CM | POA: Diagnosis not present

## 2022-07-20 DIAGNOSIS — G894 Chronic pain syndrome: Secondary | ICD-10-CM | POA: Diagnosis not present

## 2022-07-20 DIAGNOSIS — I2510 Atherosclerotic heart disease of native coronary artery without angina pectoris: Secondary | ICD-10-CM | POA: Diagnosis not present

## 2022-07-20 DIAGNOSIS — I890 Lymphedema, not elsewhere classified: Secondary | ICD-10-CM | POA: Diagnosis not present

## 2022-07-20 DIAGNOSIS — J9612 Chronic respiratory failure with hypercapnia: Secondary | ICD-10-CM | POA: Diagnosis not present

## 2022-07-20 DIAGNOSIS — M542 Cervicalgia: Secondary | ICD-10-CM | POA: Diagnosis not present

## 2022-07-20 DIAGNOSIS — G4733 Obstructive sleep apnea (adult) (pediatric): Secondary | ICD-10-CM | POA: Diagnosis not present

## 2022-07-20 DIAGNOSIS — E538 Deficiency of other specified B group vitamins: Secondary | ICD-10-CM | POA: Diagnosis not present

## 2022-07-20 DIAGNOSIS — J439 Emphysema, unspecified: Secondary | ICD-10-CM | POA: Diagnosis not present

## 2022-07-20 DIAGNOSIS — I130 Hypertensive heart and chronic kidney disease with heart failure and stage 1 through stage 4 chronic kidney disease, or unspecified chronic kidney disease: Secondary | ICD-10-CM | POA: Diagnosis not present

## 2022-07-21 ENCOUNTER — Ambulatory Visit: Payer: Self-pay

## 2022-07-21 DIAGNOSIS — D631 Anemia in chronic kidney disease: Secondary | ICD-10-CM | POA: Diagnosis not present

## 2022-07-21 DIAGNOSIS — E538 Deficiency of other specified B group vitamins: Secondary | ICD-10-CM | POA: Diagnosis not present

## 2022-07-21 DIAGNOSIS — M1030 Gout due to renal impairment, unspecified site: Secondary | ICD-10-CM | POA: Diagnosis not present

## 2022-07-21 DIAGNOSIS — J439 Emphysema, unspecified: Secondary | ICD-10-CM | POA: Diagnosis not present

## 2022-07-21 DIAGNOSIS — G931 Anoxic brain damage, not elsewhere classified: Secondary | ICD-10-CM | POA: Diagnosis not present

## 2022-07-21 DIAGNOSIS — N184 Chronic kidney disease, stage 4 (severe): Secondary | ICD-10-CM | POA: Diagnosis not present

## 2022-07-21 DIAGNOSIS — E1122 Type 2 diabetes mellitus with diabetic chronic kidney disease: Secondary | ICD-10-CM | POA: Diagnosis not present

## 2022-07-21 DIAGNOSIS — G894 Chronic pain syndrome: Secondary | ICD-10-CM | POA: Diagnosis not present

## 2022-07-21 DIAGNOSIS — G4733 Obstructive sleep apnea (adult) (pediatric): Secondary | ICD-10-CM | POA: Diagnosis not present

## 2022-07-21 DIAGNOSIS — J9612 Chronic respiratory failure with hypercapnia: Secondary | ICD-10-CM | POA: Diagnosis not present

## 2022-07-21 DIAGNOSIS — J9611 Chronic respiratory failure with hypoxia: Secondary | ICD-10-CM | POA: Diagnosis not present

## 2022-07-21 DIAGNOSIS — E1142 Type 2 diabetes mellitus with diabetic polyneuropathy: Secondary | ICD-10-CM | POA: Diagnosis not present

## 2022-07-21 DIAGNOSIS — I2510 Atherosclerotic heart disease of native coronary artery without angina pectoris: Secondary | ICD-10-CM | POA: Diagnosis not present

## 2022-07-21 DIAGNOSIS — J4489 Other specified chronic obstructive pulmonary disease: Secondary | ICD-10-CM | POA: Diagnosis not present

## 2022-07-21 DIAGNOSIS — I130 Hypertensive heart and chronic kidney disease with heart failure and stage 1 through stage 4 chronic kidney disease, or unspecified chronic kidney disease: Secondary | ICD-10-CM | POA: Diagnosis not present

## 2022-07-21 DIAGNOSIS — M542 Cervicalgia: Secondary | ICD-10-CM | POA: Diagnosis not present

## 2022-07-21 DIAGNOSIS — I890 Lymphedema, not elsewhere classified: Secondary | ICD-10-CM | POA: Diagnosis not present

## 2022-07-21 DIAGNOSIS — M159 Polyosteoarthritis, unspecified: Secondary | ICD-10-CM | POA: Diagnosis not present

## 2022-07-21 DIAGNOSIS — I5033 Acute on chronic diastolic (congestive) heart failure: Secondary | ICD-10-CM | POA: Diagnosis not present

## 2022-07-21 DIAGNOSIS — E785 Hyperlipidemia, unspecified: Secondary | ICD-10-CM | POA: Diagnosis not present

## 2022-07-21 DIAGNOSIS — I252 Old myocardial infarction: Secondary | ICD-10-CM | POA: Diagnosis not present

## 2022-07-21 NOTE — Patient Outreach (Signed)
  Care Coordination   Follow Up Visit Note   07/21/2022 Name: VICTORIYA HORNSTEIN MRN: [id number] DOB: [birth year]  FAITHFUL BERGEN is a 62 y.o. year old female who sees Tiffany Kocher, [place] for primary care. I spoke with  [name] by phone today.  What matters to the patients health and wellness today?  Patient would like to order a life alert    Goals Addressed             This Visit's Progress    Care Coordination Activities       Care Coordination Interventions: Collaboration with RN Care Manager who requests SW assistance with obtaining a life alert Performed chart review to note the patient has [facility] Medicare and Medicaid which covers a personal emergency response system Assisted the patient with contacting her health plan to order a personal emergency response system Scheduled follow up call over the next week to confirm receipt of device         SDOH assessments and interventions completed:  No     Care Coordination Interventions:  Yes, provided   Interventions Today    Flowsheet Row Most Recent Value  Chronic Disease   Chronic disease during today's visit Chronic Kidney Disease/End Stage Renal Disease (ESRD), Congestive Heart Failure (CHF)  General Interventions   General Interventions Discussed/Reviewed General Interventions Discussed  Safety Interventions   Safety Discussed/Reviewed Safety Discussed, Home Safety  Home Safety Assistive Devices  [ordered a life alert from [employer] under patients UHC benefit]        Follow up plan: Follow up call scheduled for 7/12    Encounter Outcome:  Pt. Visit Completed   Bevelyn Ngo, Kenard Gower, CDP Social Worker, Certified Dementia Practitioner [facility] Care Management  Care Coordination [phone]

## 2022-07-21 NOTE — Patient Instructions (Signed)
Visit Information  Thank you for taking time to visit with me today. Please don't hesitate to contact me if I can be of assistance to you.   Following are the goals we discussed today:  - Complete registration paperwork to register your Merian Capron once it is received   Our next appointment is by telephone on 7/12 at 10:30  Please call the care guide team at [phone] if you need to cancel or reschedule your appointment.   If you are experiencing a Mental Health or Behavioral Health Crisis or need someone to talk to, please go to [facility] [address] [place] [phone]) call 911  The patient verbalized understanding of instructions, educational materials, and care plan provided today and DECLINED offer to receive copy of patient instructions, educational materials, and care plan.   Bevelyn Ngo, BSW, CDP Social Worker, Certified Dementia Practitioner [facility] Care Management  Care Coordination [phone]

## 2022-07-22 ENCOUNTER — Other Ambulatory Visit: Payer: Self-pay | Admitting: Student

## 2022-07-25 DIAGNOSIS — R062 Wheezing: Secondary | ICD-10-CM | POA: Diagnosis not present

## 2022-07-25 DIAGNOSIS — G4733 Obstructive sleep apnea (adult) (pediatric): Secondary | ICD-10-CM | POA: Diagnosis not present

## 2022-07-25 DIAGNOSIS — J45909 Unspecified asthma, uncomplicated: Secondary | ICD-10-CM | POA: Diagnosis not present

## 2022-07-26 DIAGNOSIS — I2510 Atherosclerotic heart disease of native coronary artery without angina pectoris: Secondary | ICD-10-CM | POA: Diagnosis not present

## 2022-07-26 DIAGNOSIS — J439 Emphysema, unspecified: Secondary | ICD-10-CM | POA: Diagnosis not present

## 2022-07-26 DIAGNOSIS — I5033 Acute on chronic diastolic (congestive) heart failure: Secondary | ICD-10-CM | POA: Diagnosis not present

## 2022-07-26 DIAGNOSIS — M159 Polyosteoarthritis, unspecified: Secondary | ICD-10-CM | POA: Diagnosis not present

## 2022-07-26 DIAGNOSIS — G4733 Obstructive sleep apnea (adult) (pediatric): Secondary | ICD-10-CM | POA: Diagnosis not present

## 2022-07-26 DIAGNOSIS — M1030 Gout due to renal impairment, unspecified site: Secondary | ICD-10-CM | POA: Diagnosis not present

## 2022-07-26 DIAGNOSIS — J9612 Chronic respiratory failure with hypercapnia: Secondary | ICD-10-CM | POA: Diagnosis not present

## 2022-07-26 DIAGNOSIS — G931 Anoxic brain damage, not elsewhere classified: Secondary | ICD-10-CM | POA: Diagnosis not present

## 2022-07-26 DIAGNOSIS — E538 Deficiency of other specified B group vitamins: Secondary | ICD-10-CM | POA: Diagnosis not present

## 2022-07-26 DIAGNOSIS — E785 Hyperlipidemia, unspecified: Secondary | ICD-10-CM | POA: Diagnosis not present

## 2022-07-26 DIAGNOSIS — J9611 Chronic respiratory failure with hypoxia: Secondary | ICD-10-CM | POA: Diagnosis not present

## 2022-07-26 DIAGNOSIS — J4489 Other specified chronic obstructive pulmonary disease: Secondary | ICD-10-CM | POA: Diagnosis not present

## 2022-07-26 DIAGNOSIS — D631 Anemia in chronic kidney disease: Secondary | ICD-10-CM | POA: Diagnosis not present

## 2022-07-26 DIAGNOSIS — E1122 Type 2 diabetes mellitus with diabetic chronic kidney disease: Secondary | ICD-10-CM | POA: Diagnosis not present

## 2022-07-26 DIAGNOSIS — I890 Lymphedema, not elsewhere classified: Secondary | ICD-10-CM | POA: Diagnosis not present

## 2022-07-26 DIAGNOSIS — M542 Cervicalgia: Secondary | ICD-10-CM | POA: Diagnosis not present

## 2022-07-26 DIAGNOSIS — N184 Chronic kidney disease, stage 4 (severe): Secondary | ICD-10-CM | POA: Diagnosis not present

## 2022-07-26 DIAGNOSIS — G894 Chronic pain syndrome: Secondary | ICD-10-CM | POA: Diagnosis not present

## 2022-07-26 DIAGNOSIS — I252 Old myocardial infarction: Secondary | ICD-10-CM | POA: Diagnosis not present

## 2022-07-26 DIAGNOSIS — I130 Hypertensive heart and chronic kidney disease with heart failure and stage 1 through stage 4 chronic kidney disease, or unspecified chronic kidney disease: Secondary | ICD-10-CM | POA: Diagnosis not present

## 2022-07-26 DIAGNOSIS — E1142 Type 2 diabetes mellitus with diabetic polyneuropathy: Secondary | ICD-10-CM | POA: Diagnosis not present

## 2022-07-27 DIAGNOSIS — I130 Hypertensive heart and chronic kidney disease with heart failure and stage 1 through stage 4 chronic kidney disease, or unspecified chronic kidney disease: Secondary | ICD-10-CM | POA: Diagnosis not present

## 2022-07-27 DIAGNOSIS — J439 Emphysema, unspecified: Secondary | ICD-10-CM | POA: Diagnosis not present

## 2022-07-27 DIAGNOSIS — M159 Polyosteoarthritis, unspecified: Secondary | ICD-10-CM | POA: Diagnosis not present

## 2022-07-27 DIAGNOSIS — D631 Anemia in chronic kidney disease: Secondary | ICD-10-CM | POA: Diagnosis not present

## 2022-07-27 DIAGNOSIS — G894 Chronic pain syndrome: Secondary | ICD-10-CM | POA: Diagnosis not present

## 2022-07-27 DIAGNOSIS — I5033 Acute on chronic diastolic (congestive) heart failure: Secondary | ICD-10-CM | POA: Diagnosis not present

## 2022-07-27 DIAGNOSIS — J9612 Chronic respiratory failure with hypercapnia: Secondary | ICD-10-CM | POA: Diagnosis not present

## 2022-07-27 DIAGNOSIS — M1030 Gout due to renal impairment, unspecified site: Secondary | ICD-10-CM | POA: Diagnosis not present

## 2022-07-27 DIAGNOSIS — N184 Chronic kidney disease, stage 4 (severe): Secondary | ICD-10-CM | POA: Diagnosis not present

## 2022-07-27 DIAGNOSIS — I2510 Atherosclerotic heart disease of native coronary artery without angina pectoris: Secondary | ICD-10-CM | POA: Diagnosis not present

## 2022-07-27 DIAGNOSIS — I890 Lymphedema, not elsewhere classified: Secondary | ICD-10-CM | POA: Diagnosis not present

## 2022-07-27 DIAGNOSIS — G931 Anoxic brain damage, not elsewhere classified: Secondary | ICD-10-CM | POA: Diagnosis not present

## 2022-07-27 DIAGNOSIS — G4733 Obstructive sleep apnea (adult) (pediatric): Secondary | ICD-10-CM | POA: Diagnosis not present

## 2022-07-27 DIAGNOSIS — E538 Deficiency of other specified B group vitamins: Secondary | ICD-10-CM | POA: Diagnosis not present

## 2022-07-27 DIAGNOSIS — M542 Cervicalgia: Secondary | ICD-10-CM | POA: Diagnosis not present

## 2022-07-27 DIAGNOSIS — E1142 Type 2 diabetes mellitus with diabetic polyneuropathy: Secondary | ICD-10-CM | POA: Diagnosis not present

## 2022-07-27 DIAGNOSIS — E785 Hyperlipidemia, unspecified: Secondary | ICD-10-CM | POA: Diagnosis not present

## 2022-07-27 DIAGNOSIS — I252 Old myocardial infarction: Secondary | ICD-10-CM | POA: Diagnosis not present

## 2022-07-27 DIAGNOSIS — J4489 Other specified chronic obstructive pulmonary disease: Secondary | ICD-10-CM | POA: Diagnosis not present

## 2022-07-27 DIAGNOSIS — E1122 Type 2 diabetes mellitus with diabetic chronic kidney disease: Secondary | ICD-10-CM | POA: Diagnosis not present

## 2022-07-27 DIAGNOSIS — J9611 Chronic respiratory failure with hypoxia: Secondary | ICD-10-CM | POA: Diagnosis not present

## 2022-07-28 ENCOUNTER — Ambulatory Visit: Payer: Self-pay

## 2022-07-28 NOTE — Patient Instructions (Signed)
Visit Information  Thank you for taking time to visit with me today. Please don't hesitate to contact me if I can be of assistance to you.   Following are the goals we discussed today:  - Wear your lifeline each day   If you are experiencing a Mental Health or Behavioral Health Crisis or need someone to talk to, please call 1-800-273-TALK (toll free, 24 hour hotline) call 911  The patient verbalized understanding of instructions, educational materials, and care plan provided today and DECLINED offer to receive copy of patient instructions, educational materials, and care plan.   No further follow up required: Please contact me as needed.  Bevelyn Ngo, BSW, CDP Social Worker, Certified Dementia Practitioner [facility] Care Management  Care Coordination [phone]

## 2022-07-28 NOTE — Patient Outreach (Signed)
  Care Coordination   Follow Up Visit Note   07/28/2022 Name: SANGEETA MORPHIS MRN: [id number] DOB: [birth year]  PURITY BENET is a 62 y.o. year old female who sees Tiffany Kocher, [place] for primary care. I spoke with  [name] by phone today.  What matters to the patients health and wellness today?  No concerns, patient reports she is doing well.    Goals Addressed             This Visit's Progress    COMPLETED: Care Coordination Activities       Care Coordination Interventions: Confirmed patient has received PERS device from [facility]. Patient reports she has set up the device and is actively wearing it No other SW needs identified - encouraged the patient to contact SW as needed         SDOH assessments and interventions completed:  No     Care Coordination Interventions:  Yes, provided   Interventions Today    Flowsheet Row Most Recent Value  Chronic Disease   Chronic disease during today's visit Chronic Kidney Disease/End Stage Renal Disease (ESRD)  General Interventions   General Interventions Discussed/Reviewed General Interventions Reviewed  Safety Interventions   Safety Discussed/Reviewed Safety Discussed, Home Safety  [confirmed life alert is in place]        Follow up plan: No further intervention required. The patient will remain engaged with RN Care Manager.    Encounter Outcome:  Pt. Visit Completed   Bevelyn Ngo, BSW, CDP Social Worker, Certified Dementia Practitioner [facility] Care Management  Care Coordination [phone]

## 2022-08-01 DIAGNOSIS — E1141 Type 2 diabetes mellitus with diabetic mononeuropathy: Secondary | ICD-10-CM | POA: Diagnosis not present

## 2022-08-01 DIAGNOSIS — I5032 Chronic diastolic (congestive) heart failure: Secondary | ICD-10-CM | POA: Diagnosis not present

## 2022-08-01 DIAGNOSIS — E119 Type 2 diabetes mellitus without complications: Secondary | ICD-10-CM | POA: Diagnosis not present

## 2022-08-01 DIAGNOSIS — N1832 Chronic kidney disease, stage 3b: Secondary | ICD-10-CM | POA: Diagnosis not present

## 2022-08-01 DIAGNOSIS — M79606 Pain in leg, unspecified: Secondary | ICD-10-CM | POA: Diagnosis not present

## 2022-08-01 DIAGNOSIS — E1122 Type 2 diabetes mellitus with diabetic chronic kidney disease: Secondary | ICD-10-CM | POA: Diagnosis not present

## 2022-08-01 DIAGNOSIS — D508 Other iron deficiency anemias: Secondary | ICD-10-CM | POA: Diagnosis not present

## 2022-08-01 DIAGNOSIS — I10 Essential (primary) hypertension: Secondary | ICD-10-CM | POA: Diagnosis not present

## 2022-08-01 DIAGNOSIS — M5459 Other low back pain: Secondary | ICD-10-CM | POA: Diagnosis not present

## 2022-08-01 DIAGNOSIS — E782 Mixed hyperlipidemia: Secondary | ICD-10-CM | POA: Diagnosis not present

## 2022-08-01 DIAGNOSIS — I130 Hypertensive heart and chronic kidney disease with heart failure and stage 1 through stage 4 chronic kidney disease, or unspecified chronic kidney disease: Secondary | ICD-10-CM | POA: Diagnosis not present

## 2022-08-02 ENCOUNTER — Ambulatory Visit: Payer: Self-pay

## 2022-08-02 NOTE — Patient Outreach (Signed)
  Care Coordination   Follow Up Visit Note   08/02/2022 Name: Tiffany [name] MRN: [id number] DOB: 20-Nov-1960  Tiffany [name] is a 62 y.o. year old female who sees Tiffany [name], [place] for primary care. I spoke with  Tiffany [name] by phone today.  What matters to the patients health and wellness today?  Tiffany [name] is doing well. She has been experiencing occasional leg swelling but is elevating her legs at night; she denies any chest pain, soreness, or shortness of breath caused by her asthma. She uses her inhaler as needed, and medication adherence has improved significantly. [name] returning to her usual self and is better about taking her medications.    Goals Addressed             This Visit's Progress    I want to monitor and maintain my health        Last practice recorded BP readings:  BP Readings from Last 3 Encounters:  06/24/22 (!) 166/86  06/13/22 (!) 148/92  06/09/22 126/72   Most recent eGFR/CrCl:  Lab Results  Component Value Date   EGFR 11 (L) 05/19/2022    No components found for: "CRCL"  Evaluation of current treatment plan related to chronic kidney disease self management and patient's adherence to plan as established by provider      Reviewed medications with patient and discussed importance of compliance    Discussed plans with patient for ongoing care management follow up and provided patient with direct contact information for care management team    Patient Goals/Self-Care Activities: Take medications as prescribed   Attend all scheduled provider appointments Call pharmacy for medication refills 3-7 days in advance of running out of medications Call provider office for new concerns or questions  Continue to elevate your feet to reduce swelling  Continue to wear your life alert necklace.          SDOH assessments and interventions completed:  No     Care Coordination Interventions:  Yes, provided   Interventions Today    Flowsheet Row  Most Recent Value  Chronic Disease   Chronic disease during today's visit Other  [CKD stage 5]  General Interventions   General Interventions Discussed/Reviewed General Interventions Discussed, General Interventions Reviewed  Pharmacy Interventions   Pharmacy Dicussed/Reviewed Pharmacy Topics Discussed  Safety Interventions   Safety Discussed/Reviewed Safety Discussed, Home Safety        Follow up plan: Follow up call scheduled for 09/01/22  1 pm    Encounter Outcome:  Pt. Visit Completed   Tiffany Fairly RN, BSN, [facility] Care Coordinator [facility]   Phone: [phone]

## 2022-08-02 NOTE — Patient Instructions (Signed)
Visit Information  Thank you for taking time to visit with me today. Please don't hesitate to contact me if I can be of assistance to you.   Following are the goals we discussed today:   Goals Addressed             This Visit's Progress    I want to monitor and maintain my health        Last practice recorded BP readings:  BP Readings from Last 3 Encounters:  06/24/22 (!) 166/86  06/13/22 (!) 148/92  06/09/22 126/72   Most recent eGFR/CrCl:  Lab Results  Component Value Date   EGFR 11 (L) 05/19/2022    No components found for: "CRCL"  Evaluation of current treatment plan related to chronic kidney disease self management and patient's adherence to plan as established by provider      Reviewed medications with patient and discussed importance of compliance    Discussed plans with patient for ongoing care management follow up and provided patient with direct contact information for care management team    Patient Goals/Self-Care Activities: Take medications as prescribed   Attend all scheduled provider appointments Call pharmacy for medication refills 3-7 days in advance of running out of medications Call provider office for new concerns or questions  Continue to elevate your feet to reduce swelling  Continue to wear your life alert necklace.          Our next appointment is by telephone on 09/01/22 at 1 pm  Please call the care guide team at [phone] if you need to cancel or reschedule your appointment.   If you are experiencing a Mental Health or Behavioral Health Crisis or need someone to talk to, please call 1-800-273-TALK (toll free, 24 hour hotline)  The patient verbalized understanding of instructions, educational materials, and care plan provided today and agreed to receive a mailed copy of patient instructions, educational materials, and care plan.   Juanell Fairly RN, BSN, [facility] Care Coordinator [facility]   Phone: [phone]

## 2022-08-03 ENCOUNTER — Other Ambulatory Visit: Payer: Self-pay | Admitting: *Deleted

## 2022-08-03 DIAGNOSIS — N179 Acute kidney failure, unspecified: Secondary | ICD-10-CM

## 2022-08-07 DIAGNOSIS — E1122 Type 2 diabetes mellitus with diabetic chronic kidney disease: Secondary | ICD-10-CM | POA: Diagnosis not present

## 2022-08-07 DIAGNOSIS — M542 Cervicalgia: Secondary | ICD-10-CM | POA: Diagnosis not present

## 2022-08-07 DIAGNOSIS — D631 Anemia in chronic kidney disease: Secondary | ICD-10-CM | POA: Diagnosis not present

## 2022-08-07 DIAGNOSIS — M1030 Gout due to renal impairment, unspecified site: Secondary | ICD-10-CM | POA: Diagnosis not present

## 2022-08-07 DIAGNOSIS — J439 Emphysema, unspecified: Secondary | ICD-10-CM | POA: Diagnosis not present

## 2022-08-07 DIAGNOSIS — G4733 Obstructive sleep apnea (adult) (pediatric): Secondary | ICD-10-CM | POA: Diagnosis not present

## 2022-08-07 DIAGNOSIS — M159 Polyosteoarthritis, unspecified: Secondary | ICD-10-CM | POA: Diagnosis not present

## 2022-08-07 DIAGNOSIS — I130 Hypertensive heart and chronic kidney disease with heart failure and stage 1 through stage 4 chronic kidney disease, or unspecified chronic kidney disease: Secondary | ICD-10-CM | POA: Diagnosis not present

## 2022-08-07 DIAGNOSIS — I5033 Acute on chronic diastolic (congestive) heart failure: Secondary | ICD-10-CM | POA: Diagnosis not present

## 2022-08-07 DIAGNOSIS — I890 Lymphedema, not elsewhere classified: Secondary | ICD-10-CM | POA: Diagnosis not present

## 2022-08-07 DIAGNOSIS — G894 Chronic pain syndrome: Secondary | ICD-10-CM | POA: Diagnosis not present

## 2022-08-07 DIAGNOSIS — E538 Deficiency of other specified B group vitamins: Secondary | ICD-10-CM | POA: Diagnosis not present

## 2022-08-07 DIAGNOSIS — I2510 Atherosclerotic heart disease of native coronary artery without angina pectoris: Secondary | ICD-10-CM | POA: Diagnosis not present

## 2022-08-07 DIAGNOSIS — E1142 Type 2 diabetes mellitus with diabetic polyneuropathy: Secondary | ICD-10-CM | POA: Diagnosis not present

## 2022-08-07 DIAGNOSIS — G931 Anoxic brain damage, not elsewhere classified: Secondary | ICD-10-CM | POA: Diagnosis not present

## 2022-08-07 DIAGNOSIS — N184 Chronic kidney disease, stage 4 (severe): Secondary | ICD-10-CM | POA: Diagnosis not present

## 2022-08-07 DIAGNOSIS — I252 Old myocardial infarction: Secondary | ICD-10-CM | POA: Diagnosis not present

## 2022-08-07 DIAGNOSIS — J9612 Chronic respiratory failure with hypercapnia: Secondary | ICD-10-CM | POA: Diagnosis not present

## 2022-08-07 DIAGNOSIS — J4489 Other specified chronic obstructive pulmonary disease: Secondary | ICD-10-CM | POA: Diagnosis not present

## 2022-08-07 DIAGNOSIS — J9611 Chronic respiratory failure with hypoxia: Secondary | ICD-10-CM | POA: Diagnosis not present

## 2022-08-07 DIAGNOSIS — E785 Hyperlipidemia, unspecified: Secondary | ICD-10-CM | POA: Diagnosis not present

## 2022-08-08 DIAGNOSIS — G4733 Obstructive sleep apnea (adult) (pediatric): Secondary | ICD-10-CM | POA: Diagnosis not present

## 2022-08-08 DIAGNOSIS — J439 Emphysema, unspecified: Secondary | ICD-10-CM | POA: Diagnosis not present

## 2022-08-08 DIAGNOSIS — I890 Lymphedema, not elsewhere classified: Secondary | ICD-10-CM | POA: Diagnosis not present

## 2022-08-08 DIAGNOSIS — M1030 Gout due to renal impairment, unspecified site: Secondary | ICD-10-CM | POA: Diagnosis not present

## 2022-08-08 DIAGNOSIS — I5033 Acute on chronic diastolic (congestive) heart failure: Secondary | ICD-10-CM | POA: Diagnosis not present

## 2022-08-08 DIAGNOSIS — E538 Deficiency of other specified B group vitamins: Secondary | ICD-10-CM | POA: Diagnosis not present

## 2022-08-08 DIAGNOSIS — I252 Old myocardial infarction: Secondary | ICD-10-CM | POA: Diagnosis not present

## 2022-08-08 DIAGNOSIS — I130 Hypertensive heart and chronic kidney disease with heart failure and stage 1 through stage 4 chronic kidney disease, or unspecified chronic kidney disease: Secondary | ICD-10-CM | POA: Diagnosis not present

## 2022-08-08 DIAGNOSIS — I2510 Atherosclerotic heart disease of native coronary artery without angina pectoris: Secondary | ICD-10-CM | POA: Diagnosis not present

## 2022-08-08 DIAGNOSIS — M542 Cervicalgia: Secondary | ICD-10-CM | POA: Diagnosis not present

## 2022-08-08 DIAGNOSIS — J4489 Other specified chronic obstructive pulmonary disease: Secondary | ICD-10-CM | POA: Diagnosis not present

## 2022-08-08 DIAGNOSIS — E1122 Type 2 diabetes mellitus with diabetic chronic kidney disease: Secondary | ICD-10-CM | POA: Diagnosis not present

## 2022-08-08 DIAGNOSIS — G894 Chronic pain syndrome: Secondary | ICD-10-CM | POA: Diagnosis not present

## 2022-08-08 DIAGNOSIS — E785 Hyperlipidemia, unspecified: Secondary | ICD-10-CM | POA: Diagnosis not present

## 2022-08-08 DIAGNOSIS — J9611 Chronic respiratory failure with hypoxia: Secondary | ICD-10-CM | POA: Diagnosis not present

## 2022-08-08 DIAGNOSIS — G931 Anoxic brain damage, not elsewhere classified: Secondary | ICD-10-CM | POA: Diagnosis not present

## 2022-08-08 DIAGNOSIS — D631 Anemia in chronic kidney disease: Secondary | ICD-10-CM | POA: Diagnosis not present

## 2022-08-08 DIAGNOSIS — M159 Polyosteoarthritis, unspecified: Secondary | ICD-10-CM | POA: Diagnosis not present

## 2022-08-08 DIAGNOSIS — E1142 Type 2 diabetes mellitus with diabetic polyneuropathy: Secondary | ICD-10-CM | POA: Diagnosis not present

## 2022-08-08 DIAGNOSIS — N184 Chronic kidney disease, stage 4 (severe): Secondary | ICD-10-CM | POA: Diagnosis not present

## 2022-08-08 DIAGNOSIS — J9612 Chronic respiratory failure with hypercapnia: Secondary | ICD-10-CM | POA: Diagnosis not present

## 2022-08-15 DIAGNOSIS — G931 Anoxic brain damage, not elsewhere classified: Secondary | ICD-10-CM | POA: Diagnosis not present

## 2022-08-15 DIAGNOSIS — M1030 Gout due to renal impairment, unspecified site: Secondary | ICD-10-CM | POA: Diagnosis not present

## 2022-08-15 DIAGNOSIS — I130 Hypertensive heart and chronic kidney disease with heart failure and stage 1 through stage 4 chronic kidney disease, or unspecified chronic kidney disease: Secondary | ICD-10-CM | POA: Diagnosis not present

## 2022-08-15 DIAGNOSIS — I890 Lymphedema, not elsewhere classified: Secondary | ICD-10-CM | POA: Diagnosis not present

## 2022-08-15 DIAGNOSIS — J9612 Chronic respiratory failure with hypercapnia: Secondary | ICD-10-CM | POA: Diagnosis not present

## 2022-08-15 DIAGNOSIS — J439 Emphysema, unspecified: Secondary | ICD-10-CM | POA: Diagnosis not present

## 2022-08-15 DIAGNOSIS — E1122 Type 2 diabetes mellitus with diabetic chronic kidney disease: Secondary | ICD-10-CM | POA: Diagnosis not present

## 2022-08-15 DIAGNOSIS — J9611 Chronic respiratory failure with hypoxia: Secondary | ICD-10-CM | POA: Diagnosis not present

## 2022-08-15 DIAGNOSIS — M542 Cervicalgia: Secondary | ICD-10-CM | POA: Diagnosis not present

## 2022-08-15 DIAGNOSIS — G4733 Obstructive sleep apnea (adult) (pediatric): Secondary | ICD-10-CM | POA: Diagnosis not present

## 2022-08-15 DIAGNOSIS — I2510 Atherosclerotic heart disease of native coronary artery without angina pectoris: Secondary | ICD-10-CM | POA: Diagnosis not present

## 2022-08-15 DIAGNOSIS — E1142 Type 2 diabetes mellitus with diabetic polyneuropathy: Secondary | ICD-10-CM | POA: Diagnosis not present

## 2022-08-15 DIAGNOSIS — E538 Deficiency of other specified B group vitamins: Secondary | ICD-10-CM | POA: Diagnosis not present

## 2022-08-15 DIAGNOSIS — N184 Chronic kidney disease, stage 4 (severe): Secondary | ICD-10-CM | POA: Diagnosis not present

## 2022-08-15 DIAGNOSIS — I5033 Acute on chronic diastolic (congestive) heart failure: Secondary | ICD-10-CM | POA: Diagnosis not present

## 2022-08-15 DIAGNOSIS — I252 Old myocardial infarction: Secondary | ICD-10-CM | POA: Diagnosis not present

## 2022-08-15 DIAGNOSIS — J4489 Other specified chronic obstructive pulmonary disease: Secondary | ICD-10-CM | POA: Diagnosis not present

## 2022-08-15 DIAGNOSIS — G894 Chronic pain syndrome: Secondary | ICD-10-CM | POA: Diagnosis not present

## 2022-08-15 DIAGNOSIS — M159 Polyosteoarthritis, unspecified: Secondary | ICD-10-CM | POA: Diagnosis not present

## 2022-08-15 DIAGNOSIS — D631 Anemia in chronic kidney disease: Secondary | ICD-10-CM | POA: Diagnosis not present

## 2022-08-15 DIAGNOSIS — E785 Hyperlipidemia, unspecified: Secondary | ICD-10-CM | POA: Diagnosis not present

## 2022-08-16 DIAGNOSIS — I5032 Chronic diastolic (congestive) heart failure: Secondary | ICD-10-CM | POA: Diagnosis not present

## 2022-08-16 DIAGNOSIS — D631 Anemia in chronic kidney disease: Secondary | ICD-10-CM | POA: Diagnosis not present

## 2022-08-16 DIAGNOSIS — N2581 Secondary hyperparathyroidism of renal origin: Secondary | ICD-10-CM | POA: Diagnosis not present

## 2022-08-16 DIAGNOSIS — E1322 Other specified diabetes mellitus with diabetic chronic kidney disease: Secondary | ICD-10-CM | POA: Diagnosis not present

## 2022-08-16 DIAGNOSIS — N184 Chronic kidney disease, stage 4 (severe): Secondary | ICD-10-CM | POA: Diagnosis not present

## 2022-08-16 DIAGNOSIS — N189 Chronic kidney disease, unspecified: Secondary | ICD-10-CM | POA: Diagnosis not present

## 2022-08-16 DIAGNOSIS — I129 Hypertensive chronic kidney disease with stage 1 through stage 4 chronic kidney disease, or unspecified chronic kidney disease: Secondary | ICD-10-CM | POA: Diagnosis not present

## 2022-08-17 ENCOUNTER — Encounter: Payer: Self-pay | Admitting: Internal Medicine

## 2022-08-18 ENCOUNTER — Other Ambulatory Visit: Payer: Self-pay | Admitting: Student

## 2022-08-18 DIAGNOSIS — J42 Unspecified chronic bronchitis: Secondary | ICD-10-CM

## 2022-08-24 ENCOUNTER — Ambulatory Visit (HOSPITAL_COMMUNITY): Payer: 59

## 2022-08-29 ENCOUNTER — Ambulatory Visit (INDEPENDENT_AMBULATORY_CARE_PROVIDER_SITE_OTHER)
Admission: RE | Admit: 2022-08-29 | Discharge: 2022-08-29 | Disposition: A | Discharge status: Home / Self Care | Payer: Medicare HMO | Source: Ambulatory Visit | Attending: Vascular Surgery | Admitting: Vascular Surgery

## 2022-08-29 ENCOUNTER — Other Ambulatory Visit: Payer: Self-pay | Admitting: Student

## 2022-08-29 ENCOUNTER — Ambulatory Visit (HOSPITAL_COMMUNITY)
Admission: RE | Admit: 2022-08-29 | Discharge: 2022-08-29 | Disposition: A | Discharge status: Home / Self Care | Payer: Medicare HMO | Source: Ambulatory Visit | Attending: Vascular Surgery | Admitting: Vascular Surgery

## 2022-08-29 DIAGNOSIS — N179 Acute kidney failure, unspecified: Secondary | ICD-10-CM | POA: Diagnosis present

## 2022-08-29 DIAGNOSIS — N184 Chronic kidney disease, stage 4 (severe): Secondary | ICD-10-CM | POA: Insufficient documentation

## 2022-09-04 ENCOUNTER — Ambulatory Visit: Payer: Self-pay

## 2022-09-04 NOTE — Patient Instructions (Signed)
Visit Information  Thank you for taking time to visit with me today. Please don't hesitate to contact me if I can be of assistance to you.   Following are the goals we discussed today:   Goals Addressed             This Visit's Progress    I want to monitor and maintain my health        Last practice recorded BP readings:  BP Readings from Last 3 Encounters:  06/24/22 (!) 166/86  06/13/22 (!) 148/92  06/09/22 126/72   Most recent eGFR/CrCl:  Lab Results  Component Value Date   EGFR 10.0 07/14/2022    No components found for: "CRCL"  Evaluation of current treatment plan related to chronic kidney disease self management and patient's adherence to plan as established by provider      Reviewed medications with patient and discussed importance of compliance    Discussed plans with patient for ongoing care management follow up and provided patient with direct contact information for care management team    Patient Goals/Self-Care Activities: Take medications as prescribed   Attend all scheduled provider appointments Call pharmacy for medication refills 3-7 days in advance of running out of medications Call provider office for new concerns or questions  Continue to wear your life alert necklace. Go to your appointment scheduled on 09/12/22 for fistula placement          Our next appointment is by telephone on 10/05/22 at 1130 am  Please call the care guide team at [phone] if you need to cancel or reschedule your appointment.   If you are experiencing a Mental Health or Behavioral Health Crisis or need someone to talk to, please call 1-800-273-TALK (toll free, 24 hour hotline)  The patient verbalized understanding of instructions, educational materials, and care plan provided today and agreed to receive a mailed copy of patient instructions, educational materials, and care plan.   Juanell Fairly RN, BSN, [facility] Care Coordinator [facility]   Phone: [phone]

## 2022-09-04 NOTE — Patient Outreach (Signed)
  Care Coordination   Follow Up Visit Note   09/04/2022 Name: Tiffany [name] MRN: [id number] DOB: 1960-07-08  Tiffany [name] is a 62 y.o. year old female who sees Tiffany Kocher, [place] for primary care. I spoke with  Tiffany [name] by phone today.  What matters to the patients health and wellness today?  Tiffany [name] is managing her condition well. [name] not experiencing any leg swelling due to the medication her nephrologist started her on. [name] dealing with her increased stiffness and muscle spasms with the help of Robaxin. She has an upcoming appointment with the vascular specialist on September 12, 2022, for her fistula placement, which will be beneficial for her.     Goals Addressed             This Visit's Progress    I want to monitor and maintain my health        Last practice recorded BP readings:  BP Readings from Last 3 Encounters:  06/24/22 (!) 166/86  06/13/22 (!) 148/92  06/09/22 126/72   Most recent eGFR/CrCl:  Lab Results  Component Value Date   EGFR 10.0 07/14/2022    No components found for: "CRCL"  Evaluation of current treatment plan related to chronic kidney disease self management and patient's adherence to plan as established by provider      Reviewed medications with patient and discussed importance of compliance    Discussed plans with patient for ongoing care management follow up and provided patient with direct contact information for care management team    Patient Goals/Self-Care Activities: Take medications as prescribed   Attend all scheduled provider appointments Call pharmacy for medication refills 3-7 days in advance of running out of medications Call provider office for new concerns or questions  Continue to wear your life alert necklace. Go to your appointment scheduled on 09/12/22 for fistula placement          SDOH assessments and interventions completed:  No     Care Coordination Interventions:  Yes, provided    Interventions Today    Flowsheet Row Most Recent Value  Chronic Disease   Chronic disease during today's visit Other  [CKD stahg 5]  General Interventions   General Interventions Discussed/Reviewed General Interventions Discussed, General Interventions Reviewed  Pharmacy Interventions   Pharmacy Dicussed/Reviewed Pharmacy Topics Discussed  Safety Interventions   Safety Discussed/Reviewed Safety Reviewed          Follow up plan: Follow up call scheduled for 10/05/22  1130 am    Encounter Outcome:  Pt. Visit Completed   Juanell Fairly RN, BSN, [facility] Care Coordinator [facility]   Phone: [phone]

## 2022-09-12 ENCOUNTER — Ambulatory Visit (INDEPENDENT_AMBULATORY_CARE_PROVIDER_SITE_OTHER): Payer: Medicare HMO | Admitting: Vascular Surgery

## 2022-09-12 ENCOUNTER — Encounter: Payer: Self-pay | Admitting: Vascular Surgery

## 2022-09-12 VITALS — BP 109/67 | HR 73 | Temp 99.3°F | Resp 18 | Ht 65.0 in

## 2022-09-12 DIAGNOSIS — N185 Chronic kidney disease, stage 5: Secondary | ICD-10-CM | POA: Insufficient documentation

## 2022-09-12 NOTE — Progress Notes (Signed)
Patient name: Tiffany [name] MRN: [id number] DOB: 04/09/1960 Sex: female  REASON FOR CONSULT: Access placement   HPI: Tiffany [name] is a 62 y.o. female, with history of CHF, COPD, CKD stage 5, diabetes, stroke presents for evaluation of permanent dialysis access.  The referral from Dr. Glenna Fellows with [facility] states place AV fistula now and wait on AV graft.  Apparently had a recent acute kidney injury on CKD after cardiac arrest with diastolic heart failure.  Patient states she is right-handed.  No prior access in the past.  Not on dialysis.  No chest wall complaints.  Past Medical History:  Diagnosis Date   Abdominal pain    Acute encephalopathy 11/21/2017   Acute exacerbation of CHF (congestive heart failure) (HCC) 03/03/2022   Acute GI bleeding 03/29/2021   Acute kidney injury superimposed on chronic kidney disease (HCC) 02/18/2020   Acute metabolic encephalopathy 02/20/2020   Acute on chronic diastolic heart failure (HCC) 02/04/2022   Acute on chronic renal failure (HCC) 06/02/2022   Aggressive behavior    Agitation 11/22/2017   Anoxic brain injury (HCC) 09/08/2016   C. Arrest due to respiratory failure and COPD exacerbation   Anxiety    Arthritis    "all over" (04/10/2016)   Asthma 10/18/2010   Binge eating disorder    Blood loss anemia 04/24/2011   CAP (community acquired pneumonia) 06/22/2015   Cardiac arrest (HCC) 09/08/2016   PEA   Carotid artery stenosis    1-39% bilateral by dopplers 11/2016   Chronic diastolic (congestive) heart failure (HCC)    Chronic kidney disease, stage 3 (HCC)    Chronic kidney disease, stage 3b (HCC) 06/06/2021   Chronic pain syndrome 06/18/2012   Chronic post-traumatic stress disorder (PTSD) 05/27/2018   Chronic respiratory failure with hypoxia and hypercapnia (HCC) 06/22/2015   TRILOGY Vent >AVAPA-ES., Vt target 200-400, Max P 30 , PS max 20 , PS min 6-10 , E Max 6, E Min 4, Rate Auto AVAPS Rate 2 (titrate for pt comfort) , bleed  O2 at 5l/m continuous flow .    CKD (chronic kidney disease) 02/03/2022   Closed displaced fracture of fifth metacarpal bone 03/21/2018   Cocaine use disorder, severe, in sustained remission (HCC) 12/17/2015   Complication of anesthesia    decreased bp, decreased heart rate   COPD (chronic obstructive pulmonary disease) (HCC) 07/08/2014   Delusional disorder, persecutory type (HCC) 06/26/2021   Depression    Diabetic neuropathy (HCC) 04/24/2011   Difficulty with speech 01/24/2018   Disorder of nervous system    Drug abuse (HCC) 11/21/2017   Dyslipidemia 04/24/2011   E. coli UTI 02/20/2020   Elevated troponin 04/28/2012   Emphysema    Encephalopathy 11/21/2017   Essential hypertension 03/22/2016   Facet arthropathy, lumbar 06/12/2022   Fibula fracture 07/10/2016   Frequent falls 10/11/2017   GERD (gastroesophageal reflux disease)    GI bleed 03/30/2021   Gout 04/11/2017   Heart attack (HCC) 1980s   Hematochezia    History of blood transfusion 1994   "couldn't stop bleeding from my period"   History of drug abuse in remission (HCC) 11/28/2015   Quit in 2017   Hyperlipidemia 03/31/2021   Hyperlipidemia LDL goal <70    Hypocalcemia    Hypokalemia    Hypomagnesemia    Incontinence    Manic depression (HCC)    Morbid obesity (HCC) 10/18/2010   Obstructive sleep apnea 10/18/2010   On home oxygen therapy    "6L;  24/7" (04/10/2016)   OSA on CPAP    "wear mask sometimes" (04/10/2016)   Painless rectal bleeding 12/31/2015   Paranoid (HCC)    "sometimes; I'm on RX for it" (04/10/2016)   Periumbilical abdominal pain    Prolonged Q-T interval on ECG    Rectal bleeding 12/31/2015   Recurrent syncope 07/09/2021   Rhabdomyolysis 06/16/2021   Schizoaffective disorder, bipolar type (HCC) 04/05/2018   Seasonal allergies    Seborrheic keratoses 12/31/2013   Seizures (HCC)    "don't know what kind; last one was ~ 1 yr ago" (04/10/2016)   Sinus bradycardia    Skin ulcer of sacrum,  limited to breakdown of skin (HCC) 03/08/2022   Spondylolisthesis at L3-L4 level 06/12/2022   Stroke (HCC) 1980s   denies residual on 04/10/2016   Thrush 09/19/2013   Type 2 diabetes mellitus (HCC) 10/18/2010    Past Surgical History:  Procedure Laterality Date   CESAREAN SECTION  1997   COLONOSCOPY WITH PROPOFOL N/A 04/01/2021   Procedure: COLONOSCOPY WITH PROPOFOL;  Surgeon: Jeani Hawking, MD;  Location: [facility] ENDOSCOPY;  Service: Gastroenterology;  Laterality: N/A;  Rectal bleeding with drop in hemoglobin to 7.2 g/dL   HERNIA REPAIR     IR CHOLANGIOGRAM EXISTING TUBE  07/20/2016   IR PERC CHOLECYSTOSTOMY  05/10/2016   IR RADIOLOGIST EVAL & MGMT  06/08/2016   IR RADIOLOGIST EVAL & MGMT  06/29/2016   IR SINUS/FIST TUBE CHK-NON GI  07/12/2016   RIGHT/LEFT HEART CATH AND CORONARY ANGIOGRAPHY N/A 06/19/2017   Procedure: RIGHT/LEFT HEART CATH AND CORONARY ANGIOGRAPHY;  Surgeon: Dolores Patty, MD;  Location: [facility] INVASIVE CV LAB;  Service: Cardiovascular;  Laterality: N/A;   TIBIA IM NAIL INSERTION Right 07/12/2016   Procedure: INTRAMEDULLARY (IM) NAIL RIGHT TIBIA;  Surgeon: Tarry Kos, MD;  Location: [facility] OR;  Service: Orthopedics;  Laterality: Right;   UMBILICAL HERNIA REPAIR  ~ 1963   "that's why I don't have a belly button"   VAGINAL HYSTERECTOMY      Family History  Problem Relation Age of Onset   Cancer Mother        lung   Depression Mother    Cancer Father        prostate   Depression Sister    Anxiety disorder Sister    Schizophrenia Sister    Bipolar disorder Sister    Depression Sister    Depression Brother    Heart failure Other        cousin    SOCIAL HISTORY: Social History   Socioeconomic History   Marital status: Widowed    Spouse name: Not on file   Number of children: 3   Years of education: 12th   Highest education level: 12th grade  Occupational History   Occupation: disabled    Comment: factory production  Tobacco Use   Smoking status: Former     Current packs/day: 0.00    Average packs/day: 1.5 packs/day for 39.1 years (58.6 ttl pk-yrs)    Types: Cigarettes    Start date: 03/13/1977    Quit date: 04/10/2016    Years since quitting: 6.4    Passive exposure: Past   Smokeless tobacco: Never  Vaping Use   Vaping status: Never Used  Substance and Sexual Activity   Alcohol use: No    Alcohol/week: 0.0 standard drinks of alcohol   Drug use: Not Currently    Types: Cocaine    Comment: 04/10/2016 "last used cocaine back in November 2017"  Sexual activity: Not Currently    Birth control/protection: Surgical  Other Topics Concern   Not on file  Social History Narrative   Has 1 son, [name]   Lives with son and his boyfriend   Her house has ramps and handrails should she ever needs them.    Her mother lives down the street from her and is a good support person in addition to her son.   She drives herself, has private transportation.    Cocaine free since 02/24/16, smoke free since 04/10/16   Social Determinants of Health   Financial Resource Strain: Low Risk  (07/11/2022)   Overall Financial Resource Strain (CARDIA)    Difficulty of Paying Living Expenses: Not very hard  Food Insecurity: Food Insecurity Present (07/11/2022)   Hunger Vital Sign    Worried About Running Out of Food in the Last Year: Sometimes true    Ran Out of Food in the Last Year: Sometimes true  Transportation Needs: No Transportation Needs (06/15/2022)   PRAPARE - Administrator, Civil Service (Medical): No    Lack of Transportation (Non-Medical): No  Physical Activity: Inactive (07/11/2022)   Exercise Vital Sign    Days of Exercise per Week: 0 days    Minutes of Exercise per Session: 0 min  Stress: Stress Concern Present (07/11/2022)   [employer] of Occupational Health - Occupational Stress Questionnaire    Feeling of Stress : To some extent  Social Connections: Socially Isolated (07/11/2022)   Social Connection and Isolation Panel [NHANES]     Frequency of Communication with Friends and Family: Three times a week    Frequency of Social Gatherings with Friends and Family: Three times a week    Attends Religious Services: Never    Active Member of Clubs or Organizations: No    Attends Banker Meetings: Never    Marital Status: Widowed  Intimate Partner Violence: Not At Risk (07/11/2022)   Humiliation, Afraid, Rape, and Kick questionnaire    Fear of Current or Ex-Partner: No    Emotionally Abused: No    Physically Abused: No    Sexually Abused: No    Allergies  Allergen Reactions   Asa [Aspirin] Other (See Comments)    GI bleeds   Hydrocodone Shortness Of Breath   Latuda [Lurasidone Hcl] Anaphylaxis   Magnesium-Containing Compounds Anaphylaxis and Other (See Comments)    Tolerated Ensure   Prednisone Anaphylaxis, Swelling and Other (See Comments)    Swelling of tongue, lips, throat   Ultram [Tramadol] Anaphylaxis, Swelling and Rash   Codeine Nausea And Vomiting and Rash   Desyrel [Trazodone] Other (See Comments)    Paranoia    Hydralazine Other (See Comments)    Muscle spasms   Peanut (Diagnostic) Hives and Other (See Comments)    Raw peanuts only  OK to eat peanut butter.   Topamax [Topiramate] Other (See Comments)    Paranoia   Sulfa Antibiotics Itching   Tape Rash   Zetia [Ezetimibe] Itching and Rash    Current Outpatient Medications  Medication Sig Dispense Refill   albuterol (VENTOLIN HFA) 108 (90 Base) MCG/ACT inhaler INHALE 2 PUFFS INTO THE LUNGS EVERY 6 HOURS AS NEEDED FOR WHEEZING OR SHORTNESS OF BREATH 8.5 g 2   amLODipine (NORVASC) 5 MG tablet Take 5 mg by mouth daily.     atorvastatin (LIPITOR) 20 MG tablet Take 1 tablet (20 mg total) by mouth daily. 90 tablet 1   carvedilol (COREG) 12.5 MG tablet Take  1 tablet (12.5 mg total) by mouth 2 (two) times daily. 180 tablet 3   cyclobenzaprine (FLEXERIL) 10 MG tablet Take 10 mg by mouth 3 (three) times daily.     dicyclomine (BENTYL) 10 MG  capsule Take 10 mg by mouth 3 (three) times daily.     DULoxetine (CYMBALTA) 30 MG capsule Take 30 mg by mouth daily.     escitalopram (LEXAPRO) 20 MG tablet TAKE 1 TABLET(20 MG) BY MOUTH DAILY 30 tablet 1   ferrous sulfate 325 (65 FE) MG tablet Take 1 tablet (325 mg total) by mouth every other day. 45 tablet 0   Fluticasone-Umeclidin-Vilant (TRELEGY ELLIPTA) 100-62.5-25 MCG/ACT AEPB INHALE 1 PUFF INTO THE LUNGS DAILY 60 each 0   lamoTRIgine (LAMICTAL) 100 MG tablet Take 100 mg by mouth 3 (three) times daily.     lamoTRIgine (LAMICTAL) 25 MG tablet TAKE 1 TABLET(25 MG) BY MOUTH TWICE DAILY 60 tablet 0   loperamide (IMODIUM) 2 MG capsule Take 2 mg by mouth 2 (two) times daily.     OLANZapine (ZYPREXA) 10 MG tablet Take 10 mg by mouth at bedtime.     omeprazole (PRILOSEC) 40 MG capsule Take 1 capsule (40 mg total) by mouth every morning. (Patient taking differently: Take 40 mg by mouth daily before breakfast.) 30 capsule 6   oxyCODONE-acetaminophen (PERCOCET/ROXICET) 5-325 MG tablet Take 1 tablet by mouth daily as needed for severe pain. 7 tablet 0   potassium chloride SA (KLOR-CON M) 20 MEQ tablet Take 20 mEq by mouth daily.     prazosin (MINIPRESS) 1 MG capsule Take 2 capsules (2 mg total) by mouth at bedtime. 60 capsule 0   pregabalin (LYRICA) 100 MG capsule Take 100 mg by mouth 2 (two) times daily.     SAPHRIS 5 MG SUBL 24 hr tablet Place 5 mg under the tongue 2 (two) times daily.     torsemide 40 MG TABS Take 40 mg by mouth 2 (two) times daily. 60 tablet 0   No current facility-administered medications for this visit.    REVIEW OF SYSTEMS:  [X]  denotes positive finding, [ ]  denotes negative finding Cardiac  Comments:  Chest pain or chest pressure:    Shortness of breath upon exertion:    Short of breath when lying flat:    Irregular heart rhythm:        Vascular    Pain in calf, thigh, or hip brought on by ambulation:    Pain in feet at night that wakes you up from your sleep:      Blood clot in your veins:    Leg swelling:         Pulmonary    Oxygen at home:    Productive cough:     Wheezing:         Neurologic    Sudden weakness in arms or legs:     Sudden numbness in arms or legs:     Sudden onset of difficulty speaking or slurred speech:    Temporary loss of vision in one eye:     Problems with dizziness:         Gastrointestinal    Blood in stool:     Vomited blood:         Genitourinary    Burning when urinating:     Blood in urine:        Psychiatric    Major depression:         Hematologic    Bleeding  problems:    Problems with blood clotting too easily:        Skin    Rashes or ulcers:        Constitutional    Fever or chills:      PHYSICAL EXAM: There were no vitals filed for this visit.  GENERAL: The patient is a well-nourished female, in no acute distress. The vital signs are documented above. CARDIAC: There is a regular rate and rhythm.  VASCULAR:  Bilateral radial and brachial pulses palpable both upper extremities No chest wall implants. PULMONARY: No respiratory distress. ABDOMEN: Soft and non-tender. MUSCULOSKELETAL: There are no major deformities or cyanosis. NEUROLOGIC: No focal weakness or paresthesias are detected. SKIN: There are no ulcers or rashes noted. PSYCHIATRIC: The patient has a normal affect.  DATA:   Vein mapping shows small cephalic and basilic vein in both upper extremities  Assessment/Plan:  62 y.o. female, with history of CHF, COPD, CKD stage 5, diabetes, stroke presents for evaluation of permanent access placement.  The referral from Dr. Glenna Fellows with [facility] states place AV fistula now and wait on AV graft.  Apparently had a recent acute kidney injury on CKD after cardiac arrest with diastolic heart failure.  I discussed that her vein mapping shows she has small surface veins in both arms.  The referral from nephrology states placed AV fistula now and wait on AV graft.  I discussed since  she does not have good surface veins we would likely be placing an AV graft in the left arm.  I discussed we will wait on scheduling this until we are contacted by her nephrologist or if the patient lets [name] know it is appropriate to proceed.  Discussed poor durability of AV graft.  Risk benefits of surgery discussed including risk of steal syndrome and failure to mature.   Cephus Shelling, MD [facility] Office: [phone]

## 2022-09-27 ENCOUNTER — Other Ambulatory Visit: Payer: Self-pay | Admitting: Student

## 2022-10-03 ENCOUNTER — Emergency Department (HOSPITAL_COMMUNITY)
Admission: EM | Admit: 2022-10-03 | Discharge: 2022-10-04 | Disposition: A | Discharge status: Home / Self Care | Payer: Medicare HMO | Attending: Emergency Medicine | Admitting: Emergency Medicine

## 2022-10-03 ENCOUNTER — Encounter (HOSPITAL_COMMUNITY): Payer: Self-pay

## 2022-10-03 ENCOUNTER — Emergency Department (HOSPITAL_COMMUNITY): Payer: Medicare HMO

## 2022-10-03 ENCOUNTER — Other Ambulatory Visit: Payer: Self-pay | Admitting: Student

## 2022-10-03 ENCOUNTER — Other Ambulatory Visit: Payer: Self-pay

## 2022-10-03 DIAGNOSIS — J449 Chronic obstructive pulmonary disease, unspecified: Secondary | ICD-10-CM | POA: Diagnosis not present

## 2022-10-03 DIAGNOSIS — B370 Candidal stomatitis: Secondary | ICD-10-CM | POA: Insufficient documentation

## 2022-10-03 DIAGNOSIS — I509 Heart failure, unspecified: Secondary | ICD-10-CM | POA: Insufficient documentation

## 2022-10-03 DIAGNOSIS — E1122 Type 2 diabetes mellitus with diabetic chronic kidney disease: Secondary | ICD-10-CM | POA: Insufficient documentation

## 2022-10-03 DIAGNOSIS — E876 Hypokalemia: Secondary | ICD-10-CM | POA: Diagnosis not present

## 2022-10-03 DIAGNOSIS — Z79899 Other long term (current) drug therapy: Secondary | ICD-10-CM | POA: Insufficient documentation

## 2022-10-03 DIAGNOSIS — I132 Hypertensive heart and chronic kidney disease with heart failure and with stage 5 chronic kidney disease, or end stage renal disease: Secondary | ICD-10-CM | POA: Diagnosis not present

## 2022-10-03 DIAGNOSIS — M7989 Other specified soft tissue disorders: Secondary | ICD-10-CM | POA: Diagnosis present

## 2022-10-03 DIAGNOSIS — N185 Chronic kidney disease, stage 5: Secondary | ICD-10-CM | POA: Insufficient documentation

## 2022-10-03 LAB — BASIC METABOLIC PANEL
Anion gap: 15 (ref 5–15)
BUN: 92 mg/dL — ABNORMAL HIGH (ref 8–23)
CO2: 28 mmol/L (ref 22–32)
Calcium: 9.8 mg/dL (ref 8.9–10.3)
Chloride: 88 mmol/L — ABNORMAL LOW (ref 98–111)
Creatinine, Ser: 5.64 mg/dL — ABNORMAL HIGH (ref 0.44–1.00)
GFR, Estimated: 8 mL/min — ABNORMAL LOW (ref >60)
Glucose, Bld: 92 mg/dL (ref 70–99)
Potassium: 2.5 mmol/L — CL (ref 3.5–5.1)
Sodium: 131 mmol/L — ABNORMAL LOW (ref 135–145)

## 2022-10-03 LAB — CBC
HCT: 28.6 % — ABNORMAL LOW (ref 36.0–46.0)
Hemoglobin: 9 g/dL — ABNORMAL LOW (ref 12.0–15.0)
MCH: 28 pg (ref 26.0–34.0)
MCHC: 31.5 g/dL (ref 30.0–36.0)
MCV: 89.1 fL (ref 80.0–100.0)
Platelets: 320 10*3/uL (ref 150–400)
RBC: 3.21 MIL/uL — ABNORMAL LOW (ref 3.87–5.11)
RDW: 15.8 % — ABNORMAL HIGH (ref 11.5–15.5)
WBC: 6.6 10*3/uL (ref 4.0–10.5)
nRBC: 0 % (ref 0.0–0.2)

## 2022-10-03 LAB — TROPONIN I (HIGH SENSITIVITY): Troponin I (High Sensitivity): 18 ng/L — ABNORMAL HIGH (ref <18)

## 2022-10-03 LAB — URINALYSIS, W/ REFLEX TO CULTURE (INFECTION SUSPECTED)
Bilirubin Urine: NEGATIVE
Glucose, UA: NEGATIVE mg/dL
Ketones, ur: NEGATIVE mg/dL
Leukocytes,Ua: NEGATIVE
Nitrite: NEGATIVE
Protein, ur: 30 mg/dL — AB
Specific Gravity, Urine: 1.005 (ref 1.005–1.030)
pH: 7 (ref 5.0–8.0)

## 2022-10-03 MED ORDER — NYSTATIN 100000 UNIT/ML MT SUSP: 500000.0000 [IU] | Freq: Four times a day (QID) | OROMUCOSAL | 0 refills | Status: DC

## 2022-10-03 MED ORDER — NYSTATIN 100000 UNIT/ML MT SUSP
5.0000 mL | Freq: Four times a day (QID) | OROMUCOSAL | Status: DC
  Administered 2022-10-03: 500000 [IU] via ORAL
  Filled 2022-10-03: qty 5

## 2022-10-03 MED ORDER — POTASSIUM CHLORIDE CRYS ER 20 MEQ PO TBCR: 20.0000 meq | EXTENDED_RELEASE_TABLET | Freq: Two times a day (BID) | ORAL | 0 refills | Status: DC

## 2022-10-03 MED ORDER — POTASSIUM CHLORIDE CRYS ER 20 MEQ PO TBCR
40.0000 meq | EXTENDED_RELEASE_TABLET | Freq: Once | ORAL | Status: AC
  Administered 2022-10-04: 40 meq via ORAL
  Filled 2022-10-03: qty 2

## 2022-10-03 NOTE — Discharge Instructions (Signed)
Your kidney function is not significantly different today but your potassium was low so you will need to take 1 potassium tablet 2 times a day for the next 5 days and a prescription was sent to your pharmacy if you do not have enough at home.  Also the prescription for the thrush was sent to your pharmacy and you can use that until it improves.  You will need to follow-up with your kidney doctor so that they can recheck your potassium levels early next week.

## 2022-10-03 NOTE — ED Provider Notes (Signed)
[place] EMERGENCY DEPARTMENT AT [facility] Provider Note   CSN: [id number] Arrival date & time: 10/03/22  1420     History  No chief complaint on file.   Tiffany [name] is a 62 y.o. female.  Patient is a 62 year old female with a history of stage V chronic kidney disease not on dialysis, hypertension, CHF, COPD and diabetes who is presenting today because she reports that somebody from [facility] called her and told her that her kidney function was 8% and told her she should go to the emergency room because she was telling them that she was having swelling her in her legs and some shortness of breath.  Patient reports that last week she increased her torsemide to 80 mg twice a day because she was having swelling in her legs.  This has helped with the swelling in her legs and she reports anytime she tries to go back to her prior dose of 40 mg twice a day she feels like the swelling comes back.  Today she also felt there was some pressure and discomfort when she went to urinate.  She has not changed any of her other medications.  She reports that sometimes she has had some shortness of breath but denies any shortness of breath at rest.  She denies any cough or congestion but does report it feels like there is sandpaper in her mouth and she feels like she has to brush her teeth multiple times and swish her mouth just to get it to feel better.  The history is provided by the patient and medical records.       Home Medications Prior to Admission medications   Medication Sig Start Date End Date Taking? Authorizing Provider  albuterol (VENTOLIN HFA) 108 (90 Base) MCG/ACT inhaler INHALE 2 PUFFS INTO THE LUNGS EVERY 6 HOURS AS NEEDED FOR WHEEZING OR SHORTNESS OF BREATH 08/18/22  Yes Tiffany Kocher, DO  amLODipine (NORVASC) 10 MG tablet Take 10 mg by mouth daily.   Yes [provider]  atorvastatin (LIPITOR) 20 MG tablet TAKE 1 TABLET(20 MG) BY MOUTH DAILY 10/03/22  Yes  Tiffany Kocher, DO  calcitRIOL (ROCALTROL) 0.5 MCG capsule Take 0.5 mcg by mouth daily. 09/06/22  Yes [provider]  carvedilol (COREG) 12.5 MG tablet Take 1 tablet (12.5 mg total) by mouth 2 (two) times daily. 03/07/22  Yes Pricilla Riffle, MD  cyclobenzaprine (FLEXERIL) 10 MG tablet Take 10 mg by mouth 3 (three) times daily.   Yes [provider]  dicyclomine (BENTYL) 10 MG capsule TAKE 1 CAPSULE(10 MG) BY MOUTH THREE TIMES DAILY BEFORE MEALS 10/03/22  Yes Tiffany Kocher, DO  DULoxetine (CYMBALTA) 30 MG capsule Take 30 mg by mouth daily.   Yes [provider]  escitalopram (LEXAPRO) 20 MG tablet TAKE 1 TABLET(20 MG) BY MOUTH DAILY 08/18/22  Yes Tiffany Kocher, DO  ezetimibe (ZETIA) 10 MG tablet Take 10 mg by mouth daily. 10/01/22  Yes [provider]  ferrous sulfate 325 (65 FE) MG tablet Take 1 tablet (325 mg total) by mouth every other day. Patient taking differently: Take 325 mg by mouth daily with breakfast. 06/13/22 10/03/23 Yes Dameron, Nolberto Hanlon, DO  Fluticasone-Umeclidin-Vilant (TRELEGY ELLIPTA) 100-62.5-25 MCG/ACT AEPB INHALE 1 PUFF INTO THE LUNGS DAILY 03/20/22  Yes Tiffany Kocher, DO  folic acid (FOLVITE) 1 MG tablet Take 1 mg by mouth daily. 09/07/22  Yes [provider]  ibuprofen (ADVIL) 200 MG tablet Take 400-600 mg by mouth daily.  Yes [provider]  lamoTRIgine (LAMICTAL) 100 MG tablet Take 100 mg by mouth daily.   Yes [provider]  loperamide (IMODIUM) 2 MG capsule Take 2 mg by mouth 2 (two) times daily.   Yes [provider]  nystatin (MYCOSTATIN) 100000 UNIT/ML suspension Take 5 mLs (500,000 Units total) by mouth 4 (four) times daily. 10/03/22  Yes Kahlie Deutscher, Alphonzo Lemmings, MD  OLANZapine (ZYPREXA) 10 MG tablet Take 10 mg by mouth at bedtime.   Yes [provider]  omeprazole (PRILOSEC) 40 MG capsule Take 1 capsule (40 mg total) by mouth every morning. Patient taking differently: Take 40 mg by mouth daily  before breakfast. 04/03/22  Yes Tiffany Kocher, DO  potassium chloride SA (KLOR-CON M) 20 MEQ tablet Take 20 mEq by mouth See admin instructions. Take 1 tablet by mouth for 3 days   Yes [provider]  potassium chloride SA (KLOR-CON M) 20 MEQ tablet Take 1 tablet (20 mEq total) by mouth 2 (two) times daily for 5 days. 10/03/22 10/08/22 Yes Kellan Boehlke, Alphonzo Lemmings, MD  prazosin (MINIPRESS) 1 MG capsule Take 2 capsules (2 mg total) by mouth at bedtime. 06/13/22  Yes Dameron, Nolberto Hanlon, DO  prazosin (MINIPRESS) 2 MG capsule Take 2 mg by mouth at bedtime.   Yes [provider]  SAPHRIS 10 MG SUBL Place 1 tablet under the tongue daily.   Yes [provider]  SAPHRIS 5 MG SUBL 24 hr tablet Place 5 mg under the tongue 2 (two) times daily.   Yes [provider]  torsemide (DEMADEX) 20 MG tablet Take 80 mg by mouth 2 (two) times daily. 01/25/17  Yes [provider]      Allergies    Asa [aspirin], Hydrocodone, Latuda [lurasidone hcl], Magnesium-containing compounds, Prednisone, Ultram [tramadol], Codeine, Desyrel [trazodone], Hydralazine, Peanut (diagnostic), Topamax [topiramate], Sulfa antibiotics, Tape, and Zetia [ezetimibe]    Review of Systems   Review of Systems  Physical Exam Updated Vital Signs BP (!) 172/80   Pulse 72   Temp 98.7 F (37.1 C) (Oral)   Resp 16   SpO2 97%  Physical Exam Vitals and nursing note reviewed.  Constitutional:      General: [name] not in acute distress.    Appearance: [name] well-developed.  HENT:     Head: Normocephalic and atraumatic.     Mouth/Throat:     Mouth: Mucous membranes are dry.     Comments: White plaques noted over the tongue cheeks and palate Eyes:     Conjunctiva/sclera: Conjunctivae normal.     Pupils: Pupils are equal, round, and reactive to light.  Cardiovascular:     Rate and Rhythm: Normal rate and regular rhythm.     Heart sounds: Murmur heard.  Pulmonary:     Effort: Pulmonary effort is normal. No  respiratory distress.     Breath sounds: Normal breath sounds. No wheezing or rales.  Abdominal:     General: There is no distension.     Palpations: Abdomen is soft.     Tenderness: There is no abdominal tenderness. There is no guarding or rebound.  Musculoskeletal:        General: No tenderness. Normal range of motion.     Cervical back: Normal range of motion and neck supple.     Right lower leg: Edema present.     Left lower leg: Edema present.     Comments: Trace edema of bilateral lower ext  Skin:    General: Skin is warm and  dry.     Findings: No erythema or rash.  Neurological:     Mental Status: [name] alert and oriented to person, place, and time. Mental status is at baseline.  Psychiatric:        Behavior: Behavior normal.     ED Results / Procedures / Treatments   Labs (all labs ordered are listed, but only abnormal results are displayed) Labs Reviewed  CBC - Abnormal; Notable for the following components:      Result Value   RBC 3.21 (*)    Hemoglobin 9.0 (*)    HCT 28.6 (*)    RDW 15.8 (*)    All other components within normal limits  BASIC METABOLIC PANEL - Abnormal; Notable for the following components:   Sodium 131 (*)    Potassium 2.5 (*)    Chloride 88 (*)    BUN 92 (*)    Creatinine, Ser 5.64 (*)    GFR, Estimated 8 (*)    All other components within normal limits  URINALYSIS, W/ REFLEX TO CULTURE (INFECTION SUSPECTED) - Abnormal; Notable for the following components:   Color, Urine STRAW (*)    Hgb urine dipstick SMALL (*)    Protein, ur 30 (*)    Bacteria, UA RARE (*)    All other components within normal limits  TROPONIN I (HIGH SENSITIVITY) - Abnormal; Notable for the following components:   Troponin I (High Sensitivity) 18 (*)    All other components within normal limits    EKG EKG Interpretation Date/Time:  Tuesday October 03 2022 14:32:39 EDT Ventricular Rate:  67 PR Interval:  180 QRS Duration:  98 QT Interval:  458 QTC  Calculation: 483 R Axis:   195  Text Interpretation: Normal sinus rhythm Right superior axis deviation No significant change since last tracing When compared with ECG of 10-Jun-2022 12:17, PREVIOUS ECG IS PRESENT Confirmed by Gwyneth Sprout ([id number]) on 10/03/2022 8:52:11 PM  Radiology DG Chest 2 View  Result Date: 10/03/2022 CLINICAL DATA:  Renal failure. EXAM: CHEST - 2 VIEW COMPARISON:  06/21/2022.  Chest CT dated 02/03/2022. FINDINGS: Stable enlarged cardiac silhouette and tortuous aorta. Clear lungs. Old, healed left rib fractures with sclerosis of those portions of the ribs again demonstrated. Thoracic spine degenerative changes. Breathing motion artifact on the lateral view. IMPRESSION: 1. No acute abnormality. 2. Stable cardiomegaly. 3. Old, healed left rib fractures. Electronically Signed   By: Beckie Salts M.D.   On: 10/03/2022 16:39    Procedures Procedures    Medications Ordered in ED Medications  nystatin (MYCOSTATIN) 100000 UNIT/ML suspension 500,000 Units (500,000 Units Oral Given 10/03/22 2143)  potassium chloride SA (KLOR-CON M) CR tablet 40 mEq (has no administration in time range)    ED Course/ Medical Decision Making/ A&P                                 Medical Decision Making Amount and/or Complexity of Data Reviewed Labs: ordered. Decision-making details documented in ED Course. Radiology: ordered and independent interpretation performed. Decision-making details documented in ED Course. ECG/medicine tests: ordered and independent interpretation performed. Decision-making details documented in ED Course.  Risk Prescription drug management.   Pt with multiple medical problems and comorbidities and presenting today with a complaint that caries a high risk for morbidity and mortality.  Here today with multiple complaints but overall wanted to make sure her renal function was not worse.  Patient has  increased her torsemide to 160 mg daily.  [name] well-appearing with  sats of 97 to 100% on room air.  [name] able to speak in full sentences denies any chest pain.  She reports the nephrologist told her that her kidney function was 8% and she should come to the emergency room.  No evidence of infectious etiology today other than thrush in her mouth.  I independently interpreted patient's labs and EKG.  EKG without acute changes, UA with no evidence of infection today, BMP with creatinine of 5.64 which is not significantly different than her prior studies in June but hypokalemic with a potassium of 2.5.  Speaking with the patient this is most likely related to increased torsemide in the last week due to more swelling.  Spoke with Dr. Allena Katz with nephrology and he recommended 40 mg of potassium here and 5 days of 40 potassium daily.  Patient CBC with unchanged hemoglobin of 9.  I have independently visualized and interpreted pt's images today.  Chest x-ray without acute findings at this time.  Given patient's symptoms are more chronic in nature there is no indication for admission today.  This was discussed with the patient.  She will continue potassium at home and follow-up with Dr. Glenna Fellows.  She was given return precautions.          Final Clinical Impression(s) / ED Diagnoses Final diagnoses:  Hypokalemia  Stage 5 chronic kidney disease not on chronic dialysis (HCC)  Oral thrush    Rx / DC Orders ED Discharge Orders          Ordered    potassium chloride SA (KLOR-CON M) 20 MEQ tablet  2 times daily        10/03/22 2323    nystatin (MYCOSTATIN) 100000 UNIT/ML suspension  4 times daily        10/03/22 2323              Gwyneth Sprout, MD 10/04/22 0004

## 2022-10-03 NOTE — ED Triage Notes (Signed)
Patient arrived by [facility] with requesting to have kidney function checked as her doctor called and told patient that her renal function now at 8%. This has been ongoing for several months per patient Alert and oriented, reports some swelling to ankle and feet. On arrival no cp but reports some 2 days ago

## 2022-10-04 ENCOUNTER — Ambulatory Visit: Payer: Self-pay

## 2022-10-04 ENCOUNTER — Other Ambulatory Visit: Payer: Self-pay | Admitting: Student

## 2022-10-04 NOTE — Patient Instructions (Signed)
Visit Information  Thank you for taking time to visit with me today. Please don't hesitate to contact me if I can be of assistance to you.   Following are the goals we discussed today:   Goals Addressed             This Visit's Progress    I want to monitor and maintain my health        Last practice recorded BP readings:  BP Readings from Last 3 Encounters:  10/04/22 137/83  09/12/22 109/67  06/24/22 (!) 166/86   Most recent eGFR/CrCl:  Lab Results  Component Value Date   EGFR 10.0 07/14/2022    No components found for: "CRCL" Evaluation of current treatment plan related to chronic kidney disease self management and patient's adherence to plan as established by provider      Reviewed medications with patient and discussed importance of compliance    Discussed plans with patient for ongoing care management follow up and provided patient with direct contact information for care management team    Patient Goals/Self-Care Activities: Take medications as prescribed   Attend all scheduled provider appointments Call pharmacy for medication refills 3-7 days in advance of running out of medications Call provider office for new concerns or questions  Continue to wear your life alert necklace. Continue your regime        Our next appointment is by telephone on 10/25/22 at 10 am  Please call the care guide team at [phone] if you need to cancel or reschedule your appointment.   If you are experiencing a Mental Health or Behavioral Health Crisis or need someone to talk to, please call 1-800-273-TALK (toll free, 24 hour hotline)  The patient verbalized understanding of instructions, educational materials, and care plan provided today.    Juanell Fairly RN, BSN, [facility] [employer] Phone: [phone]

## 2022-10-04 NOTE — Patient Outreach (Signed)
Care Coordination   Follow Up Visit Note   10/04/2022 Name: Tiffany [name] MRN: [id number] DOB: December 01, 1960  Tiffany [name] is a 62 y.o. year old female who sees [name], MD for primary care. I spoke with  [name] by phone today.  What matters to the patients health and wellness today?  [name] condition is stable; she was attended to in the Emergency Department for low potassium levels. The medical team advised against the immediate placement of a fistula or a port, as premature placement could lead to infection, given her unsuitable veins. Tiffany [name] expressed interest in organizing her medication in pill packs, and a referral will be initiated accordingly.    Goals Addressed             This Visit's Progress    I want to monitor and maintain my health        Last practice recorded BP readings:  BP Readings from Last 3 Encounters:  10/04/22 137/83  09/12/22 109/67  06/24/22 (!) 166/86   Most recent eGFR/CrCl:  Lab Results  Component Value Date   EGFR 10.0 07/14/2022    No components found for: "CRCL" Evaluation of current treatment plan related to chronic kidney disease self management and patient's adherence to plan as established by provider      Reviewed medications with patient and discussed importance of compliance    Discussed plans with patient for ongoing care management follow up and provided patient with direct contact information for care management team    Patient Goals/Self-Care Activities: Take medications as prescribed   Attend all scheduled provider appointments Call pharmacy for medication refills 3-7 days in advance of running out of medications Call provider office for new concerns or questions  Continue to wear your life alert necklace. Continue your regime        SDOH assessments and interventions completed:  No     Care Coordination Interventions:  Yes, provided   Interventions Today    Flowsheet Row Most Recent  Value  Chronic Disease   Chronic disease during today's visit Other  [CKD 5]  General Interventions   General Interventions Discussed/Reviewed General Interventions Discussed  Pharmacy Interventions   Pharmacy Dicussed/Reviewed Pharmacy Topics Discussed  Safety Interventions   Safety Discussed/Reviewed Safety Discussed       Follow up plan: Follow up call scheduled for 10/25/22  10 am    Encounter Outcome:  Patient Visit Completed   Juanell Fairly RN, BSN, [facility]   Care Coordinator Phone: [phone]

## 2022-10-09 ENCOUNTER — Telehealth: Payer: Self-pay | Admitting: Pharmacist

## 2022-10-09 ENCOUNTER — Other Ambulatory Visit (HOSPITAL_COMMUNITY): Payer: Self-pay

## 2022-10-09 DIAGNOSIS — K219 Gastro-esophageal reflux disease without esophagitis: Secondary | ICD-10-CM

## 2022-10-09 IMAGING — US US RENAL
1 series · 14 of 25 positions shown · non-contrast
Comparison: 09/18/2016

CLINICAL DATA: Acute renal failure.

EXAM:
RENAL / URINARY TRACT ULTRASOUND COMPLETE

[Series 1: us renal · 14 of 105 slices shown]
[im 1/105]
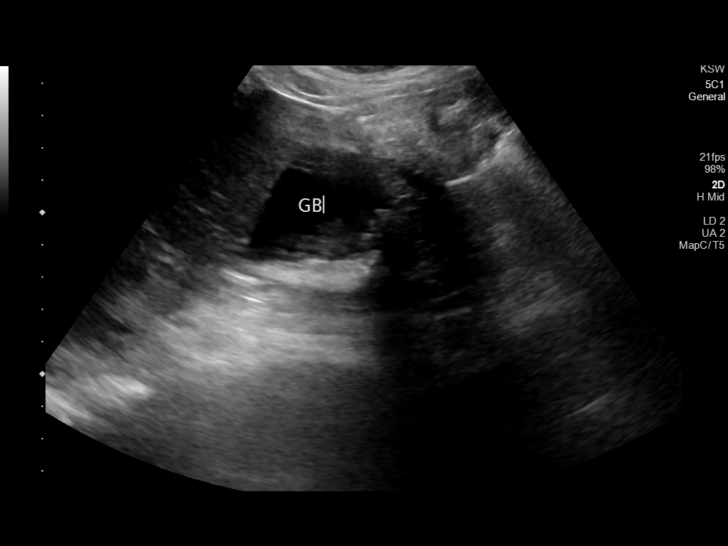
[im 9/105]
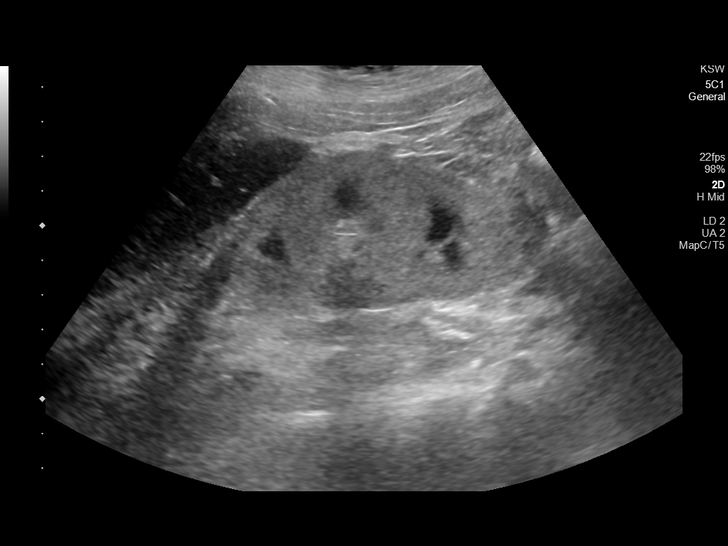
[im 18/105]
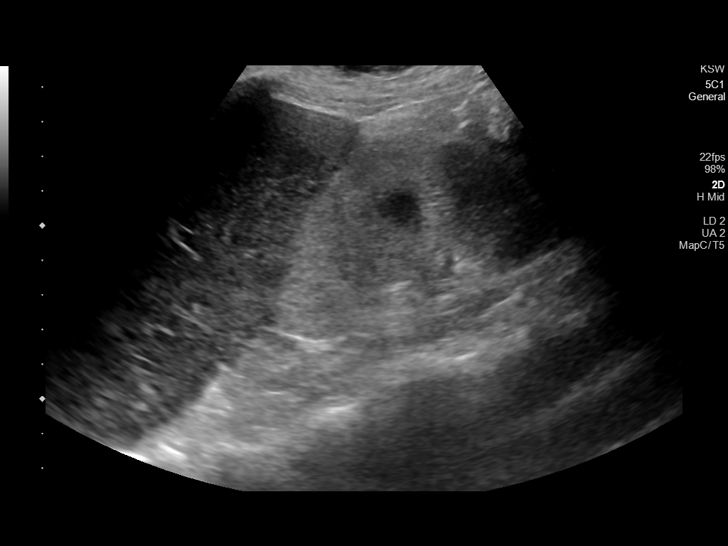
[im 27/105]
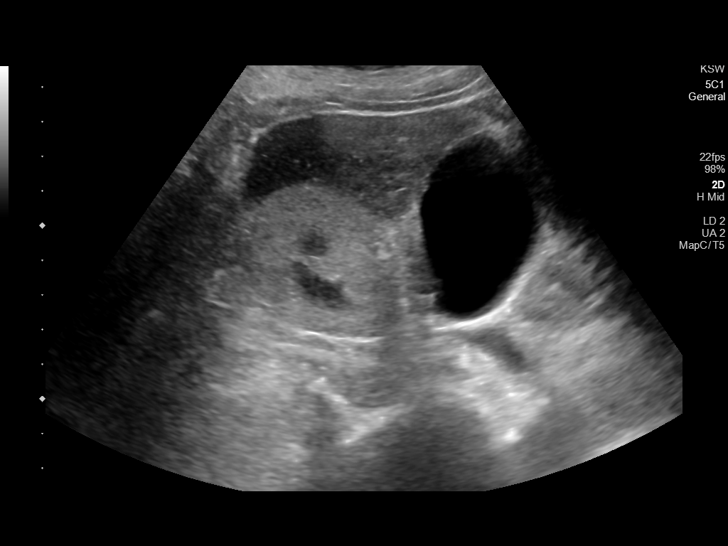
[im 35/105]
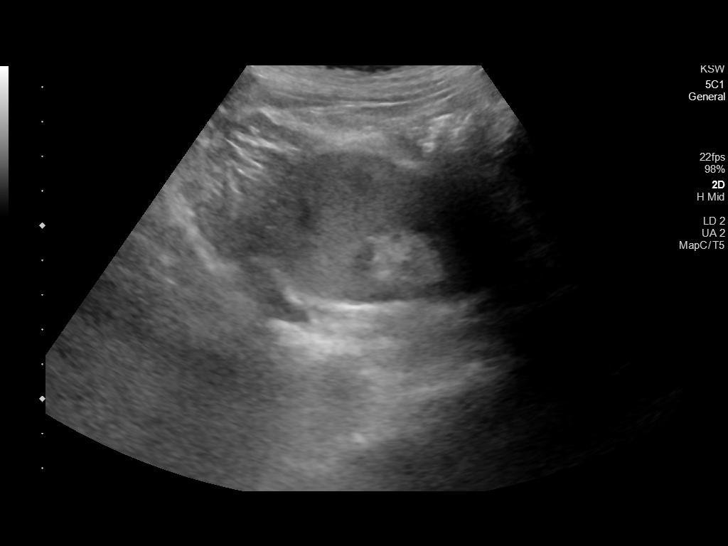
[im 40/105]
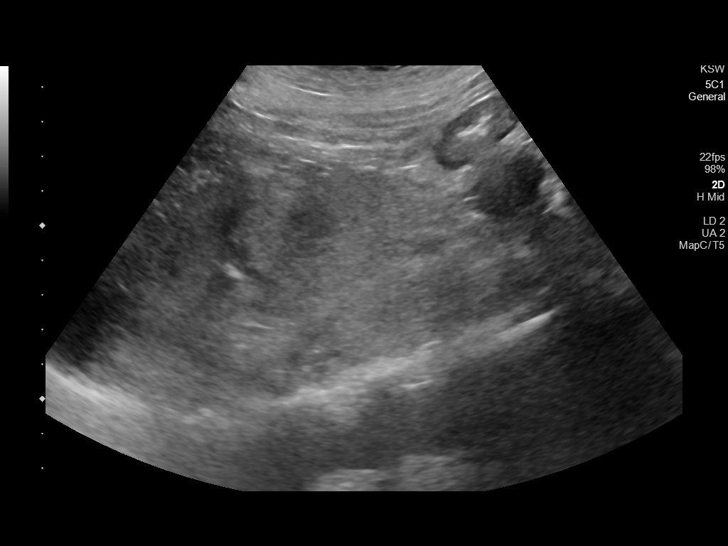
[im 48/105]
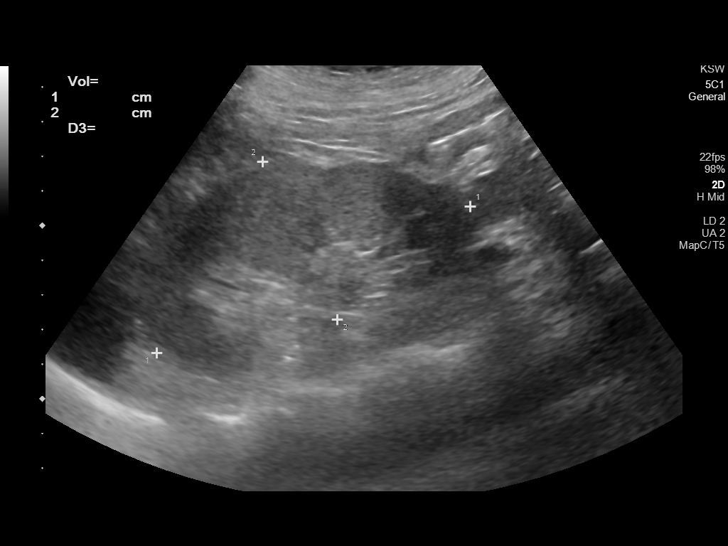
[im 57/105]
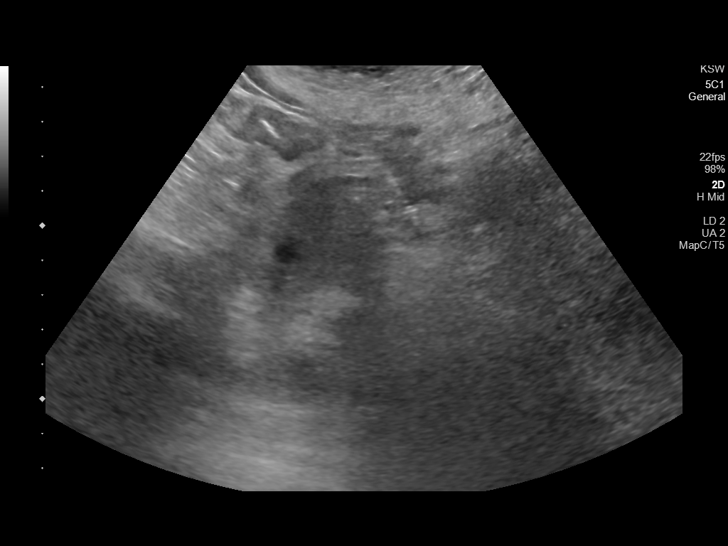
[im 66/105]
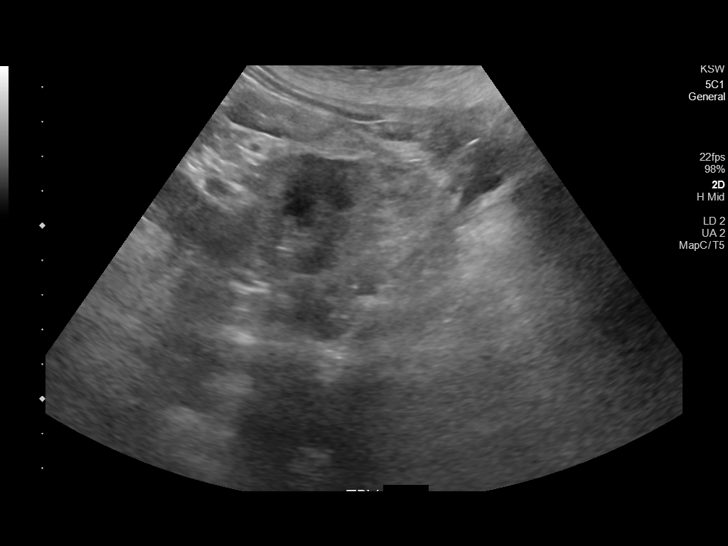
[im 70/105]
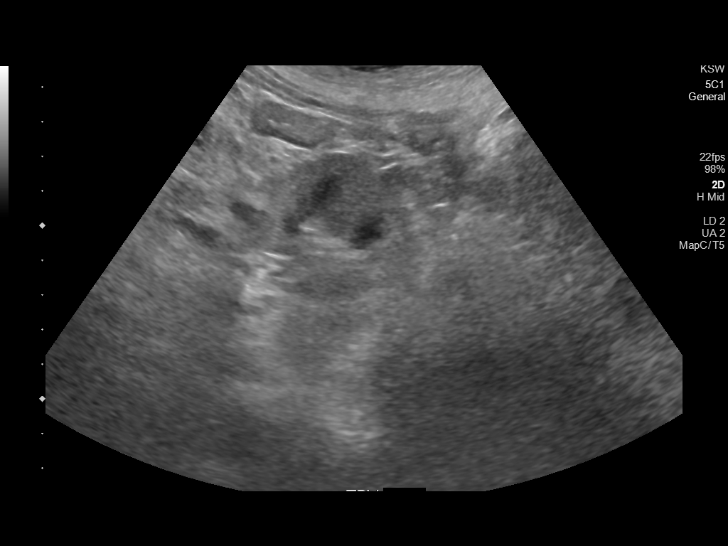
[im 79/105]
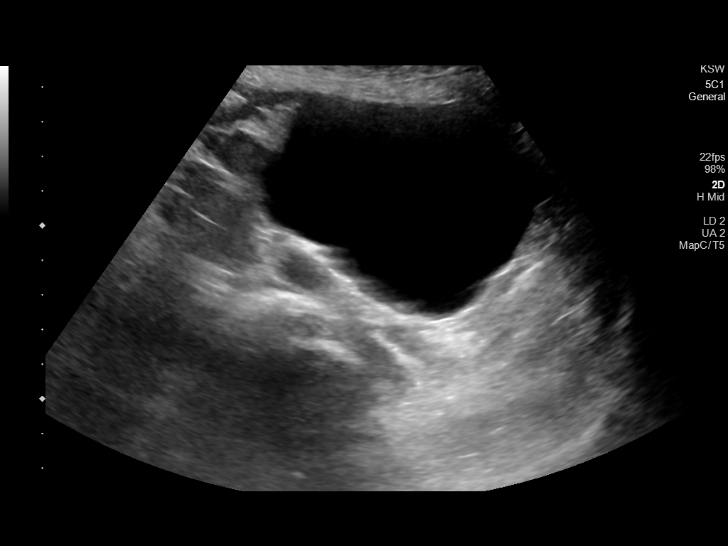
[im 87/105]
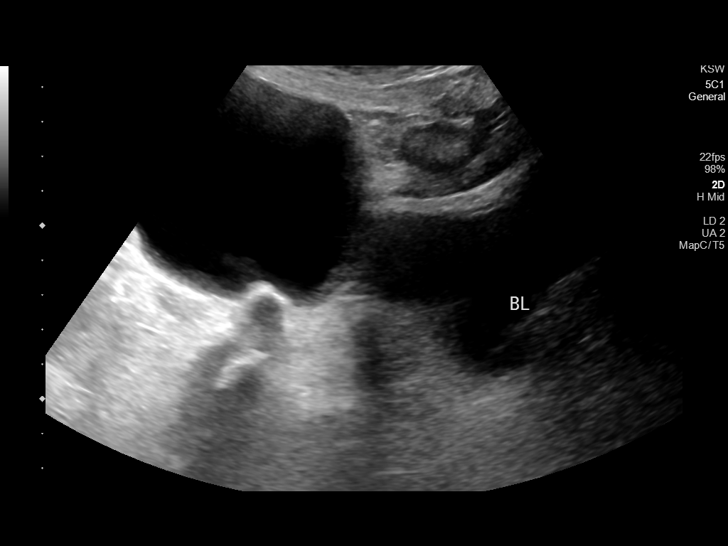
[im 96/105]
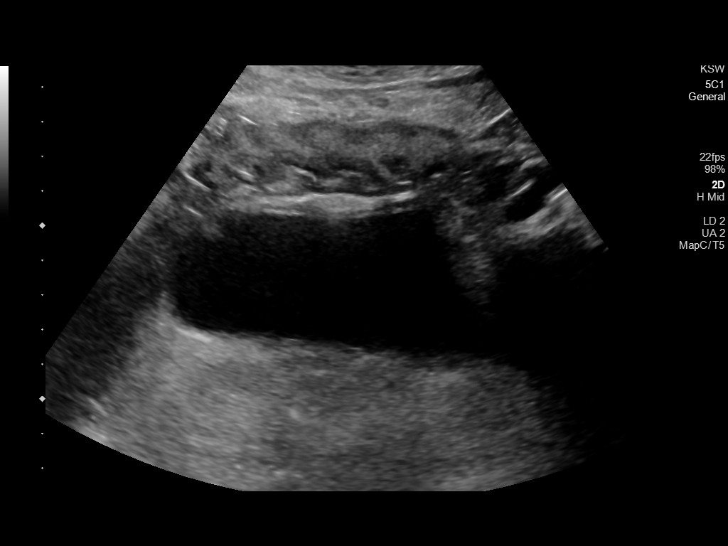
[im 105/105]
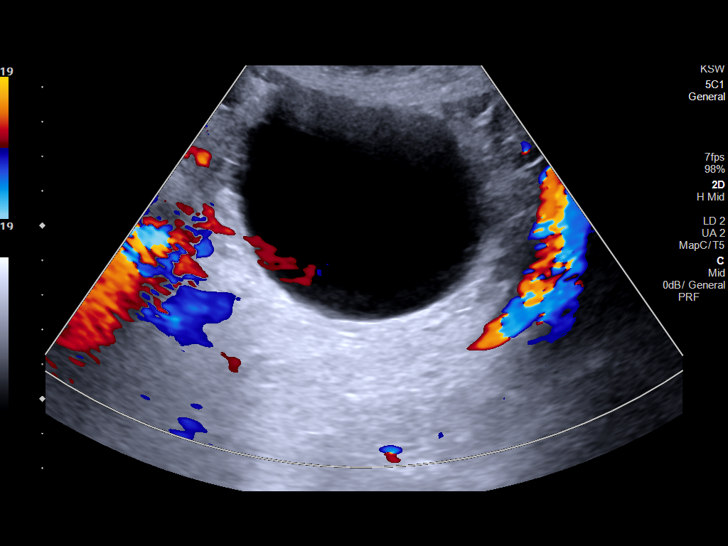

[14 of 25 positions shown; findings below may reference images not displayed]

FINDINGS: Right Kidney:

Renal measurements: 9.7 x 4.8 x 4.5 cm = volume: 109 mL. Increased
echogenicity. No mass or hydronephrosis visualized.

Left Kidney:

Renal measurements: 9.9 x 5.0 x 3.8 cm = volume: 100 mL. Increased
echogenicity. No mass or hydronephrosis visualized.

Bladder:

Appears normal for degree of bladder distention.

Other:

A cystic mass anterior to the bladder measures 7.1 x 7.0 x 8.0 cm,
not evident on the prior ultrasound. Multiple small stones were
noted in the gallbladder, not evaluated in detail on this renal
ultrasound.
IMPRESSION: 1. Echogenic kidneys compatible with medical renal disease. No
hydronephrosis.
2. 8 cm cystic mass anterior to the bladder. Consider CT for further
evaluation.
3. Cholelithiasis.

## 2022-10-09 IMAGING — CT CT HEAD W/O CM
2 of 4 series · 11 of 47 positions shown, 13 images · non-contrast
Comparison: None.

CLINICAL DATA: Delirium, confusion

EXAM:
CT HEAD WITHOUT CONTRAST
TECHNIQUE: Contiguous axial images were obtained from the base of the skull
through the vertex without intravenous contrast.

[Series 5: coronal soft tissue · coronal · 0.31mm/px · 3 of 73 slices shown]
[im 25/73  brain]
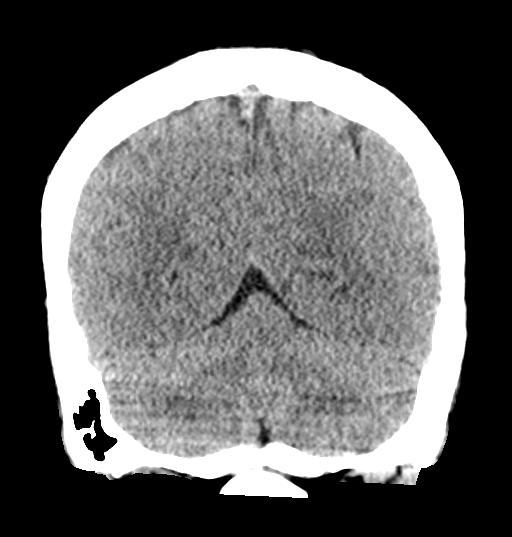
[im 33/73  brain]
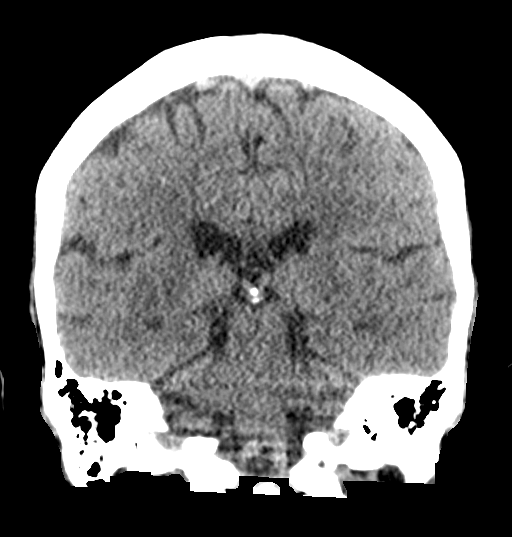
[im 41/73  brain]
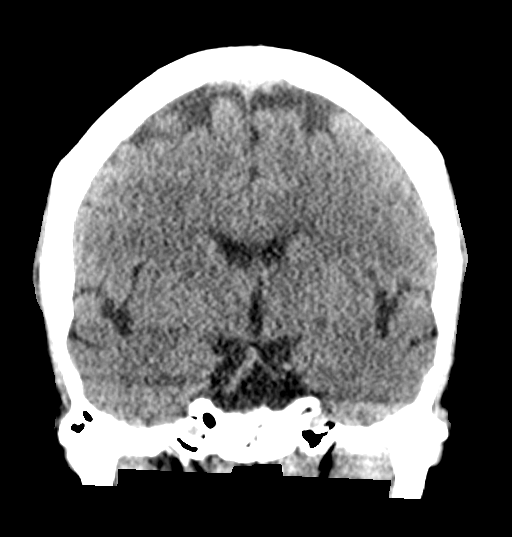

[Series 7: true axial · axial · 0.29mm/px · z∈[-141,-12]mm · 8 of 57 slices shown, 10 images]
[im 7/57  brain]
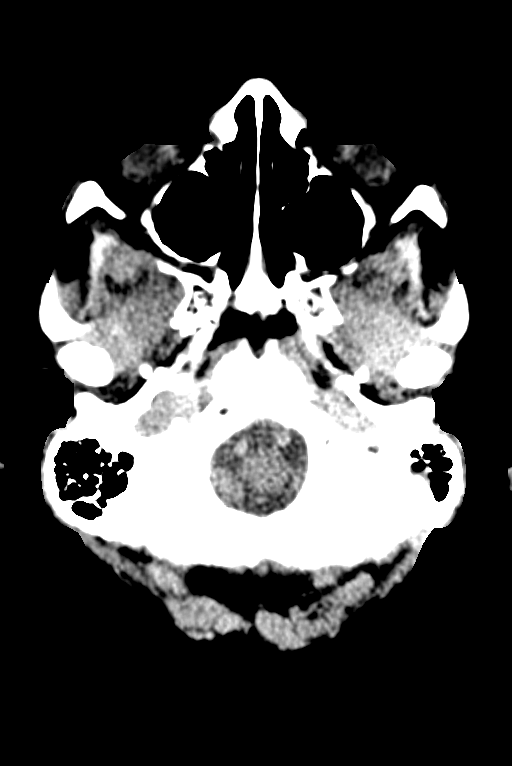
[im 7/57  bone]
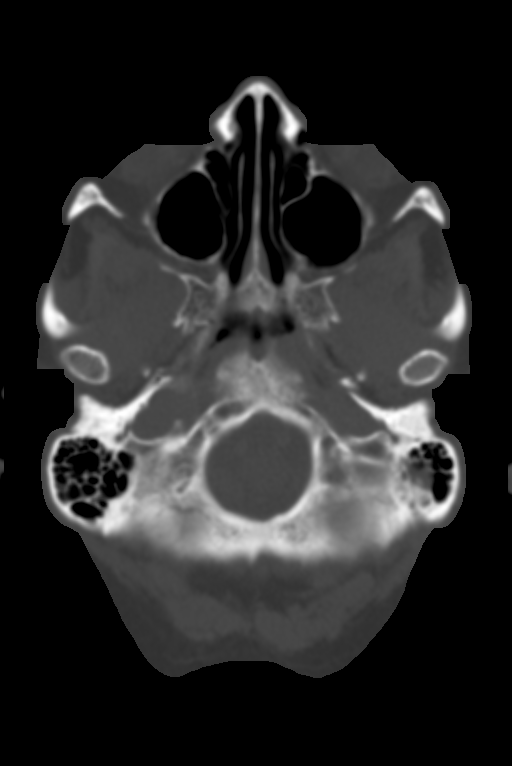
[im 13/57  brain]
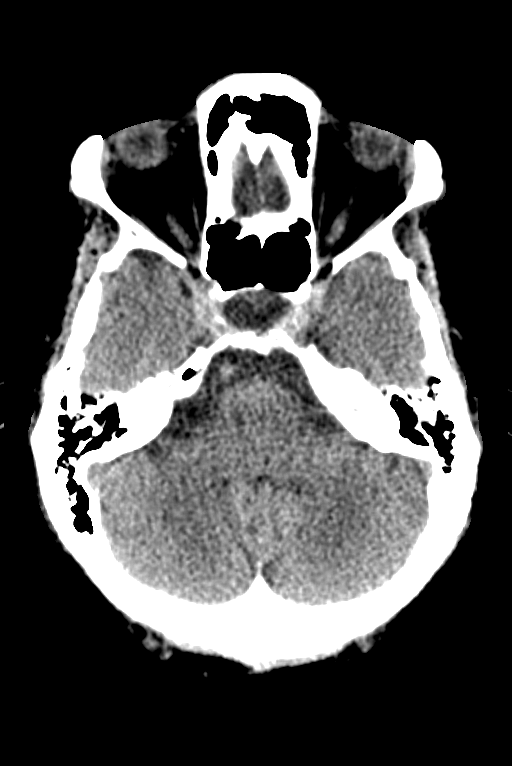
[im 19/57  brain]
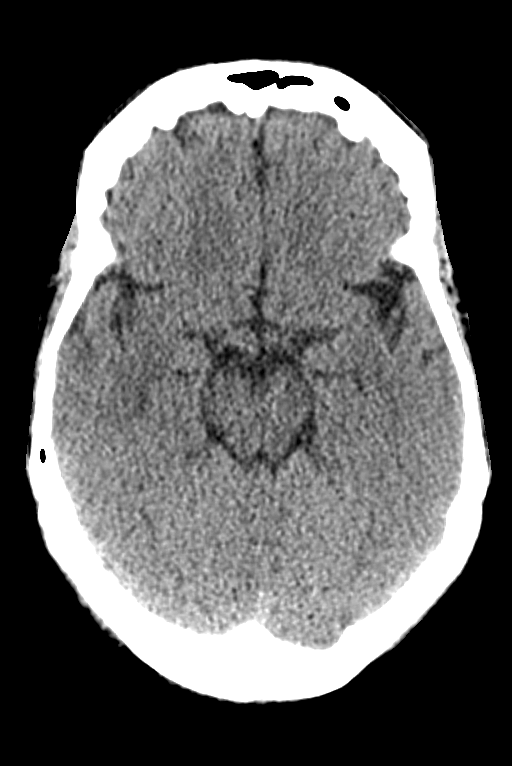
[im 25/57  brain]
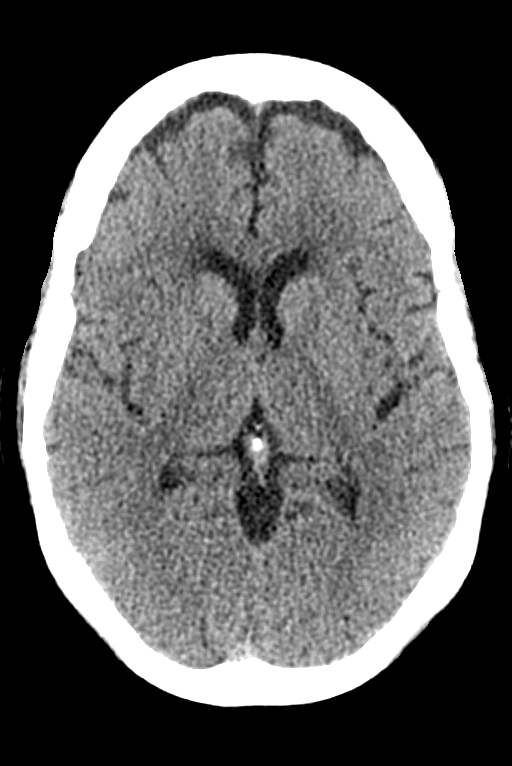
[im 32/57  brain]
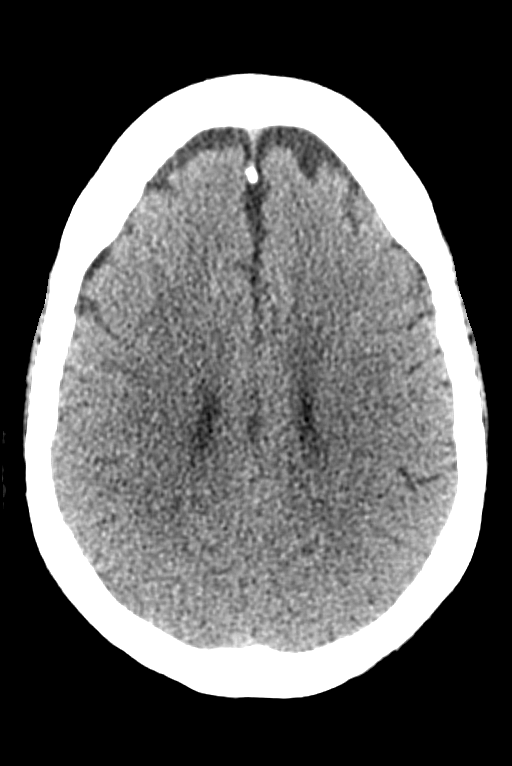
[im 32/57  bone]
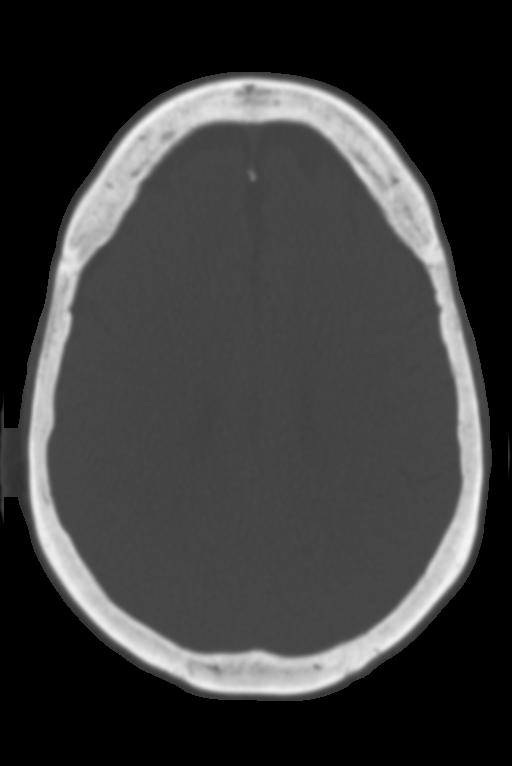
[im 38/57  brain]
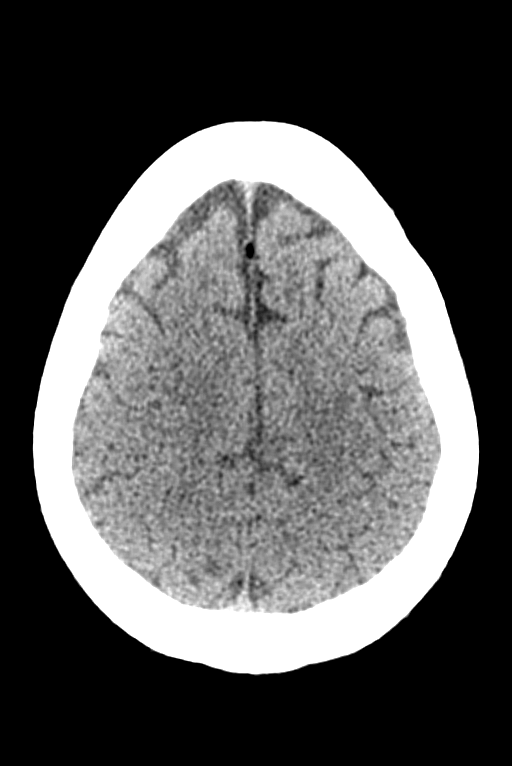
[im 44/57  brain]
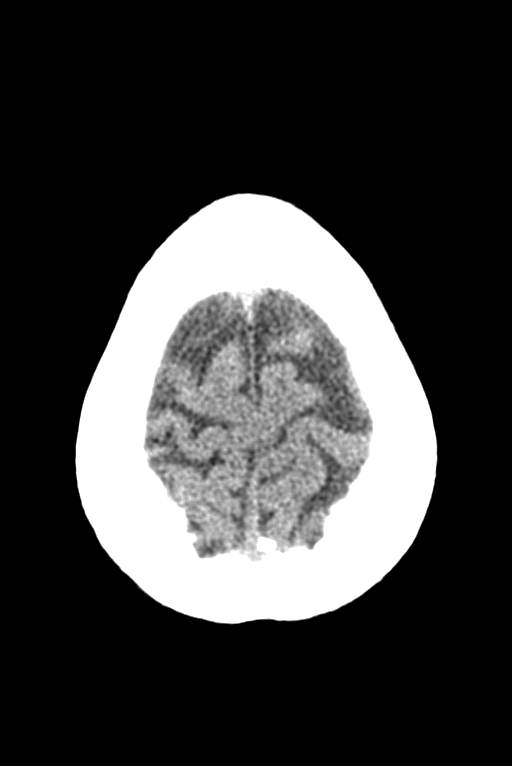
[im 50/57  brain]
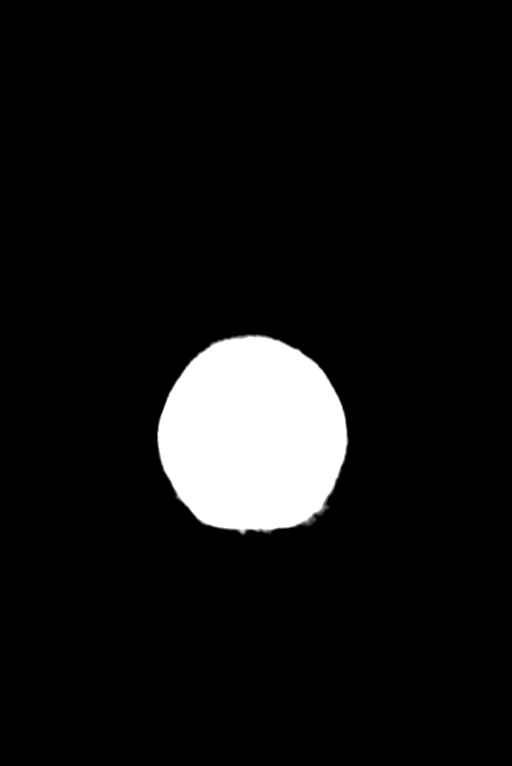

[11 of 47 positions shown; findings below may reference images not displayed]

FINDINGS: Brain: Normal anatomic configuration. No abnormal intra or
extra-axial mass lesion or fluid collection. No abnormal mass effect
or midline shift. No evidence of acute intracranial hemorrhage or
infarct. Ventricular size is normal. Cerebellum unremarkable.

Vascular: Unremarkable

Skull: Intact

Sinuses/Orbits: Paranasal sinuses are clear. Orbits are
unremarkable.

Other: Mastoid air cells and middle ear cavities are clear.
IMPRESSION: No acute intracranial hemorrhage or infarct.

## 2022-10-09 IMAGING — DX DG CHEST 1V PORT
1 series · 1 of 1 positions shown · non-contrast
Comparison: 03/30/2019

CLINICAL DATA: Altered mental status.

EXAM:
PORTABLE CHEST 1 VIEW

[chest ap]
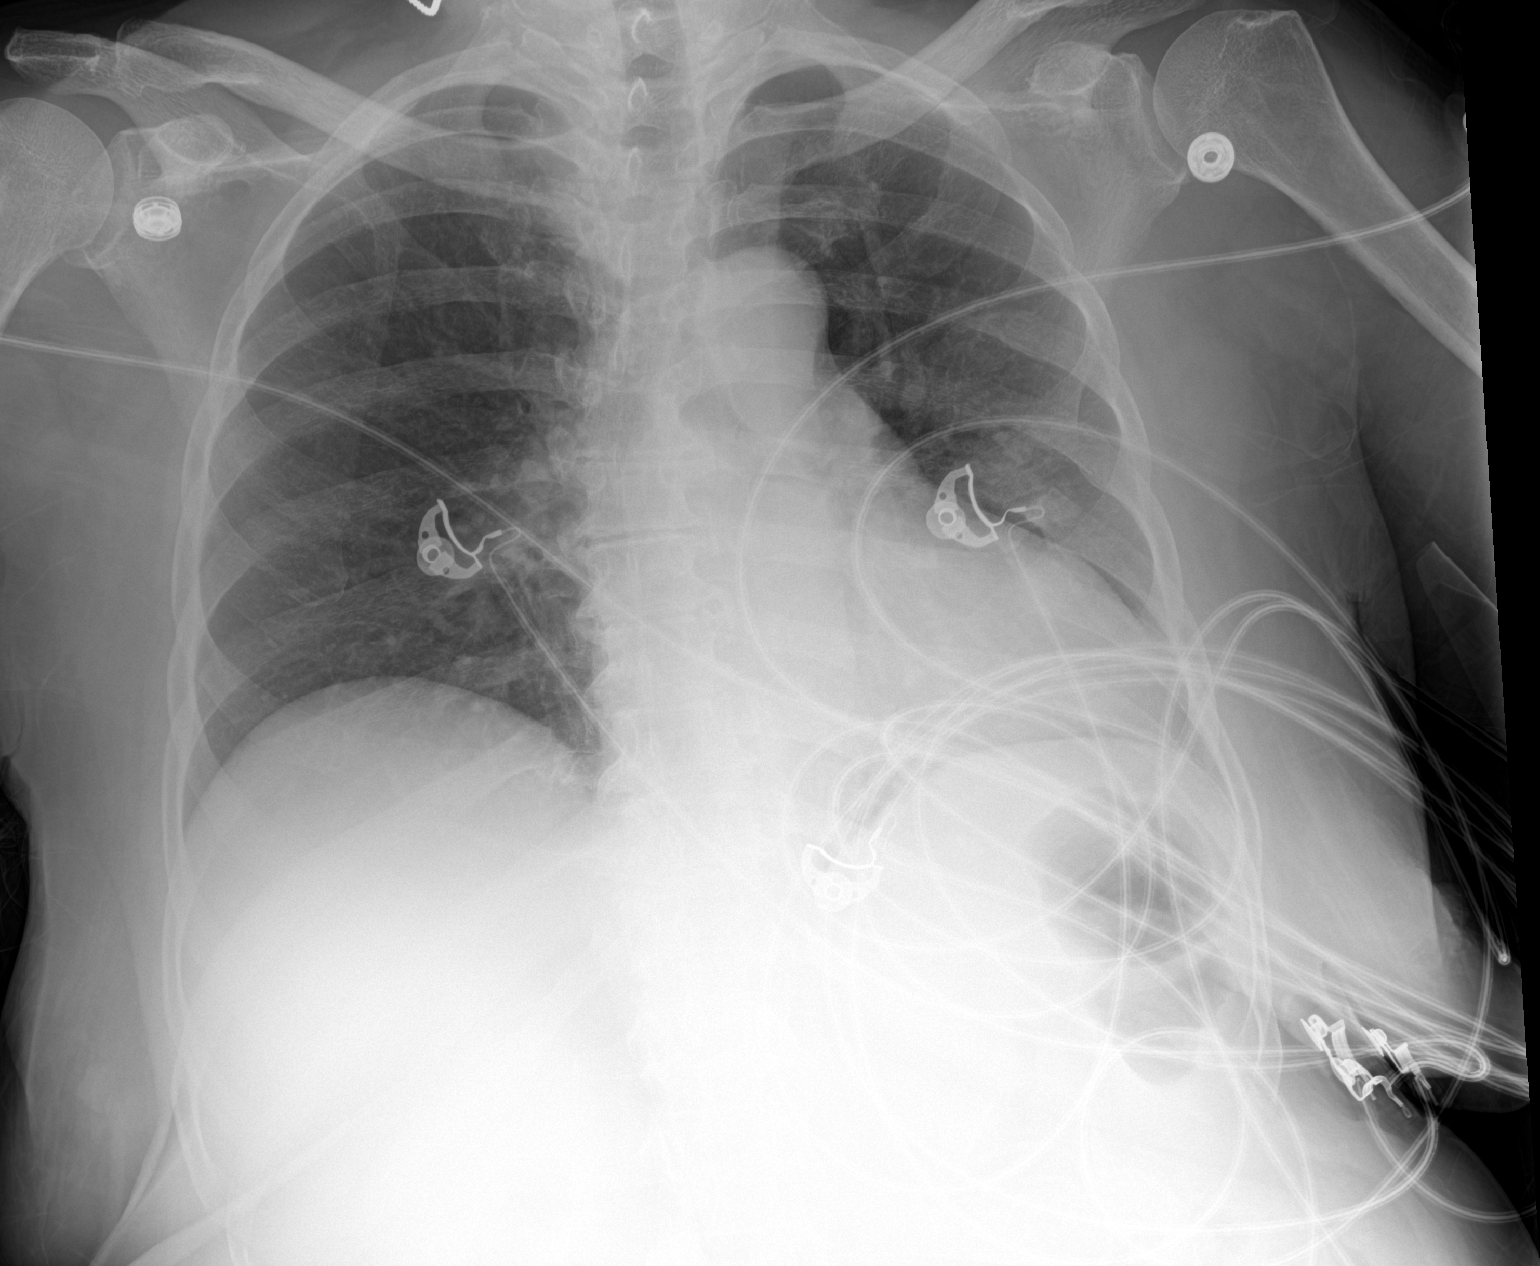

[1 of 1 positions shown; findings below may reference images not displayed]

FINDINGS: Telemetry leads overlie the chest. The cardiomediastinal silhouette
is unchanged with normal heart size. The lungs are hypoinflated with
mild hazy opacity in the left mid lung. No sizable pleural effusion
or pneumothorax is identified. No acute osseous abnormality is seen.
IMPRESSION: Hypoinflation with mild hazy left mid lung opacity which may reflect
atelectasis or pneumonia.

## 2022-10-09 MED ORDER — CALCITRIOL 0.5 MCG PO CAPS
0.5000 ug | ORAL_CAPSULE | Freq: Every day | ORAL | 5 refills | Status: DC
Start: 2022-10-09 — End: 2023-07-07
  Filled 2022-10-09 – 2022-10-24 (×3): qty 30, 30d supply, fill #0
  Filled 2022-11-09 – 2022-11-28 (×2): qty 30, 30d supply, fill #1
  Filled 2022-12-28 – 2023-01-30 (×2): qty 30, 30d supply, fill #2
  Filled 2023-02-20 – 2023-02-22 (×2): qty 30, 30d supply, fill #3
  Filled 2023-04-04 – 2023-04-05 (×2): qty 30, 30d supply, fill #4
  Filled 2023-05-02 (×2): qty 30, 30d supply, fill #5

## 2022-10-09 MED ORDER — CARVEDILOL 12.5 MG PO TABS
12.5000 mg | ORAL_TABLET | Freq: Two times a day (BID) | ORAL | 5 refills | Status: DC
  Filled 2022-10-09: qty 60, 30d supply, fill #0
  Filled 2022-10-10: qty 30, 15d supply, fill #0
  Filled 2022-10-24: qty 60, 30d supply, fill #1
  Filled 2022-11-28 – 2023-01-30 (×5): qty 60, 30d supply, fill #2

## 2022-10-09 MED ORDER — PRAZOSIN HCL 2 MG PO CAPS
2.0000 mg | ORAL_CAPSULE | Freq: Every day | ORAL | 5 refills | Status: DC
  Filled 2022-10-09: qty 30, 30d supply, fill #0
  Filled 2022-10-10 – 2022-10-11 (×2): qty 15, 15d supply, fill #0
  Filled 2022-10-24 – 2023-01-30 (×3): qty 30, 30d supply, fill #0

## 2022-10-09 MED ORDER — AMLODIPINE BESYLATE 10 MG PO TABS
10.0000 mg | ORAL_TABLET | Freq: Every day | ORAL | 5 refills | Status: DC
  Filled 2022-10-09 – 2023-01-31 (×10): qty 30, 30d supply, fill #0

## 2022-10-09 MED ORDER — ATORVASTATIN CALCIUM 20 MG PO TABS
20.0000 mg | ORAL_TABLET | Freq: Every day | ORAL | 5 refills | Status: DC
  Filled 2022-10-09 – 2023-01-30 (×7): qty 30, 30d supply, fill #0
  Filled 2023-02-20 – 2023-02-22 (×2): qty 30, 30d supply, fill #1
  Filled 2023-04-04 – 2023-04-05 (×2): qty 30, 30d supply, fill #2
  Filled 2023-05-02: qty 30, 30d supply, fill #3

## 2022-10-09 MED ORDER — OMEPRAZOLE 40 MG PO CPDR
40.0000 mg | DELAYED_RELEASE_CAPSULE | Freq: Every morning | ORAL | 5 refills | Status: DC
  Filled 2022-10-09: qty 30, 30d supply, fill #0
  Filled 2022-10-10: qty 15, 15d supply, fill #0
  Filled 2022-10-24 (×2): qty 15, 15d supply, fill #1
  Filled 2022-10-24: qty 30, 30d supply, fill #1
  Filled 2022-11-28: qty 30, 30d supply, fill #2
  Filled 2022-12-28 – 2023-01-04 (×2): qty 30, 30d supply, fill #3
  Filled 2023-01-22 – 2023-01-30 (×2): qty 30, 30d supply, fill #4
  Filled 2023-02-20 – 2023-02-22 (×2): qty 30, 30d supply, fill #5
  Filled 2023-04-04: qty 15, 15d supply, fill #6

## 2022-10-09 MED ORDER — DICYCLOMINE HCL 10 MG PO CAPS
10.0000 mg | ORAL_CAPSULE | Freq: Three times a day (TID) | ORAL | 5 refills | Status: DC
  Filled 2022-10-09 – 2023-01-30 (×6): qty 90, 30d supply, fill #0
  Filled 2023-02-20 – 2023-02-22 (×2): qty 90, 30d supply, fill #1
  Filled 2023-04-04 – 2023-04-14 (×2): qty 90, 30d supply, fill #2
  Filled 2023-04-16: qty 63, 21d supply, fill #2
  Filled 2023-05-01 – 2023-05-07 (×4): qty 63, 21d supply, fill #3

## 2022-10-09 MED ORDER — SAPHRIS 10 MG SL SUBL
1.0000 | SUBLINGUAL_TABLET | Freq: Every day | SUBLINGUAL | 5 refills | Status: DC
  Filled 2022-10-09 – 2022-11-09 (×3): qty 30, 30d supply, fill #0

## 2022-10-09 MED ORDER — DULOXETINE HCL 30 MG PO CPEP
30.0000 mg | ORAL_CAPSULE | Freq: Every day | ORAL | 5 refills | Status: DC
  Filled 2022-10-09 – 2023-01-30 (×7): qty 30, 30d supply, fill #0
  Filled 2023-02-20 – 2023-02-22 (×2): qty 30, 30d supply, fill #1

## 2022-10-09 MED ORDER — EZETIMIBE 10 MG PO TABS
10.0000 mg | ORAL_TABLET | Freq: Every day | ORAL | 5 refills | Status: DC
  Filled 2022-10-09 – 2023-01-04 (×5): qty 30, 30d supply, fill #0
  Filled 2023-01-22 – 2023-01-30 (×2): qty 30, 30d supply, fill #1
  Filled 2023-02-20 – 2023-02-22 (×2): qty 30, 30d supply, fill #2
  Filled 2023-04-04 – 2023-04-05 (×2): qty 30, 30d supply, fill #3

## 2022-10-09 MED ORDER — LAMOTRIGINE 100 MG PO TABS
100.0000 mg | ORAL_TABLET | Freq: Every day | ORAL | 5 refills | Status: DC
  Filled 2022-10-09 – 2022-10-24 (×3): qty 30, 30d supply, fill #0
  Filled 2022-11-28: qty 30, 30d supply, fill #1
  Filled 2023-01-30: qty 30, 30d supply, fill #2

## 2022-10-09 MED ORDER — FERROUS SULFATE 325 (65 FE) MG PO TABS
325.0000 mg | ORAL_TABLET | ORAL | 5 refills | Status: DC
  Filled 2022-10-09 – 2023-01-30 (×4): qty 30, 60d supply, fill #0
  Filled 2023-04-04 – 2023-04-05 (×2): qty 30, 60d supply, fill #1
  Filled 2023-04-05: qty 15, 30d supply, fill #1
  Filled 2023-05-01 – 2023-05-02 (×3): qty 15, 30d supply, fill #2

## 2022-10-09 MED ORDER — ESCITALOPRAM OXALATE 20 MG PO TABS
20.0000 mg | ORAL_TABLET | Freq: Every day | ORAL | 5 refills | Status: DC
  Filled 2022-10-09: qty 30, 30d supply, fill #0
  Filled 2022-10-10 – 2022-10-11 (×2): qty 15, 15d supply, fill #0
  Filled 2022-10-24: qty 30, 30d supply, fill #0
  Filled 2022-11-28: qty 30, 30d supply, fill #1
  Filled 2022-12-28 – 2023-01-04 (×2): qty 30, 30d supply, fill #2
  Filled 2023-01-22 – 2023-01-30 (×2): qty 30, 30d supply, fill #3
  Filled 2023-02-20 – 2023-02-22 (×2): qty 30, 30d supply, fill #4

## 2022-10-09 MED ORDER — OLANZAPINE 10 MG PO TABS
10.0000 mg | ORAL_TABLET | Freq: Every day | ORAL | 5 refills | Status: DC
  Filled 2022-10-09 – 2022-10-24 (×2): qty 30, 30d supply, fill #0

## 2022-10-09 MED ORDER — FOLIC ACID 1 MG PO TABS
1.0000 mg | ORAL_TABLET | Freq: Every day | ORAL | 5 refills | Status: DC
  Filled 2022-10-09: qty 30, 30d supply, fill #0
  Filled 2022-10-10: qty 15, 15d supply, fill #0
  Filled 2022-10-24: qty 30, 30d supply, fill #1
  Filled 2022-11-28: qty 30, 30d supply, fill #2
  Filled 2022-12-28 – 2023-01-04 (×2): qty 30, 30d supply, fill #3
  Filled 2023-01-22 – 2023-01-30 (×2): qty 30, 30d supply, fill #4
  Filled 2023-02-20 – 2023-02-22 (×2): qty 30, 30d supply, fill #5
  Filled 2023-04-04: qty 15, 15d supply, fill #6
  Filled 2023-04-05: qty 30, 30d supply, fill #6

## 2022-10-09 MED ORDER — LOPERAMIDE HCL 2 MG PO CAPS
2.0000 mg | ORAL_CAPSULE | Freq: Two times a day (BID) | ORAL | 5 refills | Status: DC
  Filled 2022-10-09: qty 30, 15d supply, fill #0
  Filled 2022-10-10: qty 60, 30d supply, fill #0
  Filled 2022-12-28 – 2023-01-03 (×3): qty 30, 15d supply, fill #0
  Filled 2023-01-30: qty 60, 30d supply, fill #0
  Filled 2023-02-20 – 2023-02-27 (×2): qty 60, 30d supply, fill #1

## 2022-10-09 MED ORDER — CYCLOBENZAPRINE HCL 10 MG PO TABS
10.0000 mg | ORAL_TABLET | Freq: Three times a day (TID) | ORAL | 5 refills | Status: DC
  Filled 2022-10-09: qty 90, 30d supply, fill #0
  Filled 2022-10-10: qty 45, 15d supply, fill #0
  Filled 2022-10-24 (×2): qty 90, 30d supply, fill #1
  Filled 2022-11-28 – 2023-01-04 (×3): qty 90, 30d supply, fill #2
  Filled 2023-01-22 – 2023-01-30 (×3): qty 90, 30d supply, fill #3
  Filled 2023-04-04 – 2023-04-14 (×2): qty 90, 30d supply, fill #4
  Filled 2023-04-16: qty 63, 21d supply, fill #4
  Filled 2023-05-01 – 2023-07-13 (×6): qty 63, 21d supply, fill #5

## 2022-10-09 NOTE — Telephone Encounter (Signed)
-----   Message from Otho Najjar sent at 10/05/2022 10:29 AM EDT ----- Regarding: FW: Medication Need Per Catie: when patients are requesting pill packs:   communicate with Sherlynn Stalls and/or Osvaldo Shipper at [facility] to talk through what to package when, get patient set up in the [facility] system. ----- Message ----- From: Juanell Fairly, RN Sent: 10/04/2022   9:04 PM EDT To: Otho Najjar, CPhT Subject: Medication Need                                Hi Camille The patient would like to see about getting her medications put in pill packs and delivered to her home.  Can you help her with that?  Traci

## 2022-10-09 NOTE — Telephone Encounter (Signed)
Patient contacted for follow-up of request for medication pill pack. Reviewed patient's medications.  Contacted [employer] about patient's request for medication pill pack. They requested all scripts to be sent over and relayed patient's information and request for medication pill packs to [facility].   Sent over all scripts for medications that will be in the pill pack.  Total time with patient and pharmacy call and documentation of interaction: 20 minutes.  Patient takes medications as following:   amlodipine (Norvasc) - patient takes in the morning atorvastatin (Lipitor) - patient takes in the morning calcitriol (Rocaltrol) - patient takes in the morning carvedilol (Coreg) - patient takes in the morning and evening cyclobenzaprine (Flexeril) - patient takes 3 times daily (morning, afternoon, and evening) dicyclomine (Bentyl) - patient takes 3 times daily (morning, afternoon, and evening) duloxetine (Cymbalta) - patient takes in the morning escitalopram (Lexapro) - patient takes in the morning ferrous sulfate - patient takes daily in the morning lamotrigine (Lamictal) - patient takes in the morning loperamide (Imodium) - patient takes 3 times a day (morning, afternoon, and evening) olanzapine (Zyprexa) - patient takes in the morning  omeprazole (Prilosec) - patient takes in the morning prazosin (Minipress) - patient takes at bedtime Saphris - patient takes in the morning  Note: patient does not want ibuprofen, potassium chloride, and torsemide in the medication pill pack

## 2022-10-09 NOTE — Telephone Encounter (Signed)
Reviewed and agree with Dr Macky Lower plan and appreciate him handling this task for the resident.

## 2022-10-10 ENCOUNTER — Other Ambulatory Visit (HOSPITAL_COMMUNITY): Payer: Self-pay

## 2022-10-10 ENCOUNTER — Other Ambulatory Visit: Payer: Self-pay

## 2022-10-10 ENCOUNTER — Other Ambulatory Visit: Payer: Self-pay | Admitting: Student

## 2022-10-10 MED ORDER — TORSEMIDE 20 MG PO TABS
20.0000 mg | ORAL_TABLET | Freq: Two times a day (BID) | ORAL | 0 refills | Status: DC
  Filled 2022-10-10: qty 60, 30d supply, fill #0
  Filled 2022-10-11: qty 30, 15d supply, fill #0
  Filled 2022-10-18: qty 60, 30d supply, fill #0

## 2022-10-11 ENCOUNTER — Other Ambulatory Visit (HOSPITAL_COMMUNITY): Payer: Self-pay

## 2022-10-12 ENCOUNTER — Other Ambulatory Visit (HOSPITAL_COMMUNITY): Payer: Self-pay

## 2022-10-15 IMAGING — DX DG ABDOMEN 1V
1 series · 1 of 1 positions shown · non-contrast
Comparison: April 10, 2016

CLINICAL DATA: Diarrhea. Not feeling well. Positive for C diff 3
days ago.

EXAM:
ABDOMEN - 1 VIEW

[abdomen kub]
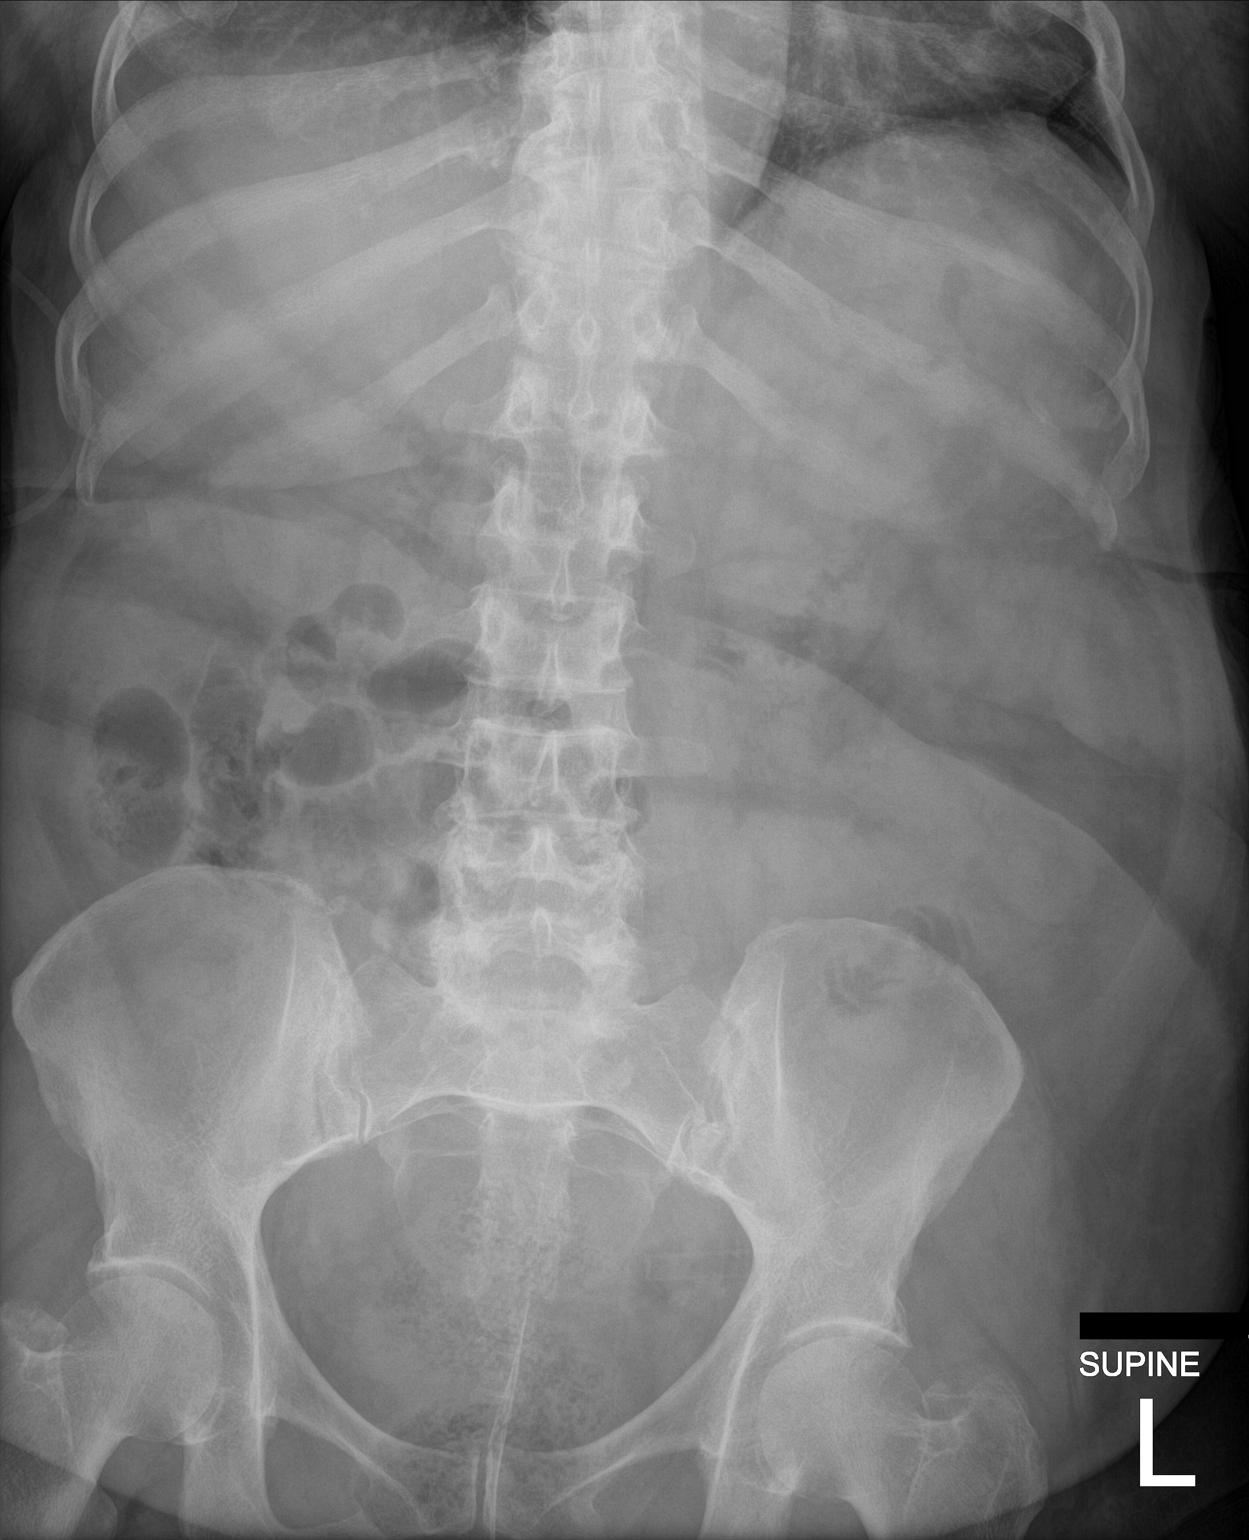

[1 of 1 positions shown; findings below may reference images not displayed]

FINDINGS: There is a paucity of bowel gas limiting evaluation. However, no
evidence of obstruction is identified. No free air, portal venous
gas, or pneumatosis. No renal or ureteral stones are identified. No
acute abnormalities.
IMPRESSION: There is a paucity of bowel gas limiting evaluation. No acute
abnormalities are noted within this limitation.

## 2022-10-16 ENCOUNTER — Inpatient Hospital Stay (HOSPITAL_COMMUNITY)
Admission: RE | Admit: 2022-10-16 | Discharge: 2022-10-16 | Disposition: A | Discharge status: Home / Self Care | Payer: Medicare HMO | Source: Ambulatory Visit | Attending: Internal Medicine | Admitting: Internal Medicine

## 2022-10-16 ENCOUNTER — Encounter (HOSPITAL_COMMUNITY): Payer: Self-pay

## 2022-10-18 ENCOUNTER — Other Ambulatory Visit (HOSPITAL_COMMUNITY): Payer: Self-pay

## 2022-10-18 MED ORDER — POTASSIUM CHLORIDE CRYS ER 20 MEQ PO TBCR
40.0000 meq | EXTENDED_RELEASE_TABLET | Freq: Every day | ORAL | 0 refills | Status: DC
  Filled 2022-10-18: qty 10, 5d supply, fill #0

## 2022-10-19 ENCOUNTER — Other Ambulatory Visit (HOSPITAL_COMMUNITY): Payer: Self-pay

## 2022-10-23 ENCOUNTER — Other Ambulatory Visit: Payer: Self-pay | Admitting: Student

## 2022-10-23 DIAGNOSIS — J42 Unspecified chronic bronchitis: Secondary | ICD-10-CM

## 2022-10-24 ENCOUNTER — Other Ambulatory Visit: Payer: Self-pay

## 2022-10-24 ENCOUNTER — Other Ambulatory Visit (HOSPITAL_COMMUNITY): Payer: Self-pay

## 2022-10-24 ENCOUNTER — Encounter (HOSPITAL_COMMUNITY): Payer: Self-pay

## 2022-10-25 ENCOUNTER — Ambulatory Visit: Payer: Self-pay

## 2022-10-25 ENCOUNTER — Other Ambulatory Visit (HOSPITAL_COMMUNITY): Payer: Self-pay

## 2022-10-25 NOTE — Patient Instructions (Signed)
Visit Information  Thank you for taking time to visit with me today. Please don't hesitate to contact me if I can be of assistance to you.   If you are experiencing a Mental Health or Behavioral Health Crisis or need someone to talk to, please call 1-800-273-TALK (toll free, 24 hour hotline)  The patient verbalized understanding of instructions, educational materials, and care plan provided today.   Follow Up Plan: RNCM will follow up at the next scheduled Interval.  Juanell Fairly RN, BSN, [facility]   Care Coordinator Phone: [phone]

## 2022-10-25 NOTE — Patient Outreach (Signed)
Care Coordination   Follow Up Visit Note   10/25/2022 Name: Tiffany [name] MRN: [id number] DOB: September 03, 1960  Tiffany [name] is a 62 y.o. year old female who sees [name], MD for primary care. I spoke with  Tiffany [name] by phone today.  What matters to the patients health and wellness today?  I spoke with Tiffany [name] at her regular scheduled time, but she was exhausted and sleepy and asked if I could call her later today, so we rescheduled her call for 3:00 PM. I called Tiffany [name] back at 3 pm. She stated that she still hadn't heard anything from the pharmacy, so we reviewed the notes and went back over those where she talked with Tiffany [name], Pharm D, and he had talked with her about her medications. There were three medications that she wanted to avoid being included in the pill pack, which were ibuprofen, potassium chloride, and Torsemide. They were called into [facility]. So today, we called [employer] and spoke with [facility], who got in touch with [facility], who handles the pill packs. She stated that her medication was ready, the instructions were on the box, the medicines for nighttime were written on the pack, and the medications for daytime were written on the packs so she could come to pick the medicines up. Tiffany [name] needed her medicines now, so she would have somebody pick them up this time, and the next time, they would be ready to mail out to her home.     SDOH assessments and interventions completed:  No     Care Coordination Interventions:  Yes, provided   Interventions Today    Flowsheet Row Most Recent Value  Chronic Disease   Chronic disease during today's visit Other  [Medications]  General Interventions   General Interventions Discussed/Reviewed Communication with  Communication with --  Tiffany [name] long]  Pharmacy Interventions   Pharmacy Dicussed/Reviewed Pharmacy Topics Discussed, Pharmacy Topics Reviewed  Safety Interventions    Safety Discussed/Reviewed Safety Discussed        Follow Up Plan: RNCM will follow up at the next scheduled Interval.  Encounter Outcome:  Patient Visit Completed   Tiffany Fairly RN, BSN, [facility]   Care Coordinator Phone: [phone]

## 2022-10-26 ENCOUNTER — Other Ambulatory Visit (HOSPITAL_COMMUNITY): Payer: Self-pay

## 2022-10-26 ENCOUNTER — Other Ambulatory Visit: Payer: Self-pay | Admitting: Student

## 2022-11-02 ENCOUNTER — Emergency Department (HOSPITAL_COMMUNITY)
Admission: EM | Admit: 2022-11-02 | Discharge: 2022-11-02 | Discharge status: Against Medical Advice | Payer: Medicare HMO | Attending: Emergency Medicine | Admitting: Emergency Medicine

## 2022-11-02 ENCOUNTER — Emergency Department (HOSPITAL_COMMUNITY): Payer: Medicare HMO

## 2022-11-02 DIAGNOSIS — R079 Chest pain, unspecified: Secondary | ICD-10-CM | POA: Insufficient documentation

## 2022-11-02 DIAGNOSIS — I509 Heart failure, unspecified: Secondary | ICD-10-CM | POA: Insufficient documentation

## 2022-11-02 DIAGNOSIS — M7989 Other specified soft tissue disorders: Secondary | ICD-10-CM | POA: Diagnosis present

## 2022-11-02 DIAGNOSIS — Z992 Dependence on renal dialysis: Secondary | ICD-10-CM | POA: Insufficient documentation

## 2022-11-02 DIAGNOSIS — R0602 Shortness of breath: Secondary | ICD-10-CM | POA: Diagnosis not present

## 2022-11-02 DIAGNOSIS — Z5321 Procedure and treatment not carried out due to patient leaving prior to being seen by health care provider: Secondary | ICD-10-CM | POA: Diagnosis not present

## 2022-11-02 LAB — COMPREHENSIVE METABOLIC PANEL
ALT: 20 U/L (ref 0–44)
AST: 26 U/L (ref 15–41)
Albumin: 3.8 g/dL (ref 3.5–5.0)
Alkaline Phosphatase: 142 U/L — ABNORMAL HIGH (ref 38–126)
Anion gap: 15 (ref 5–15)
BUN: 46 mg/dL — ABNORMAL HIGH (ref 8–23)
CO2: 23 mmol/L (ref 22–32)
Calcium: 9.9 mg/dL (ref 8.9–10.3)
Chloride: 102 mmol/L (ref 98–111)
Creatinine, Ser: 4.89 mg/dL — ABNORMAL HIGH (ref 0.44–1.00)
GFR, Estimated: 9 mL/min — ABNORMAL LOW (ref >60)
Glucose, Bld: 78 mg/dL (ref 70–99)
Potassium: 5.2 mmol/L — ABNORMAL HIGH (ref 3.5–5.1)
Sodium: 140 mmol/L (ref 135–145)
Total Bilirubin: 0.6 mg/dL (ref 0.3–1.2)
Total Protein: 7.3 g/dL (ref 6.5–8.1)

## 2022-11-02 LAB — CBC
HCT: 32.7 % — ABNORMAL LOW (ref 36.0–46.0)
Hemoglobin: 10 g/dL — ABNORMAL LOW (ref 12.0–15.0)
MCH: 28.7 pg (ref 26.0–34.0)
MCHC: 30.6 g/dL (ref 30.0–36.0)
MCV: 94 fL (ref 80.0–100.0)
Platelets: 368 10*3/uL (ref 150–400)
RBC: 3.48 MIL/uL — ABNORMAL LOW (ref 3.87–5.11)
RDW: 15.9 % — ABNORMAL HIGH (ref 11.5–15.5)
WBC: 6.1 10*3/uL (ref 4.0–10.5)
nRBC: 0 % (ref 0.0–0.2)

## 2022-11-02 LAB — TROPONIN I (HIGH SENSITIVITY)
Troponin I (High Sensitivity): 9 ng/L (ref <18)
Troponin I (High Sensitivity): 8 ng/L (ref <18)

## 2022-11-02 NOTE — ED Provider Triage Note (Signed)
Emergency Medicine Provider Triage Evaluation Note  Tiffany [name] , a 62 y.o. female  was evaluated in triage.  Pt complains of lower leg swelling and abdominal swelling since last night. She had her first dialysis session yestday and reports taht she started to have abdominal swelling and bilateral leg swelling. Reports some mild cp and SOB this morning. She was concerned given the swelling. .  Review of Systems  Positive:  Negative:   Physical Exam  BP 128/75 (BP Location: Left Arm)   Pulse 94   Temp 98.6 F (37 C) (Oral)   Resp 18   SpO2 95%  Gen:   Awake, no distress   Resp:  Normal effort  MSK:   Moves extremities without difficulty  Other:  1-2+ pitting edema to the bilateral lower extremity. Abdomen soft.   Medical Decision Making  Medically screening exam initiated at 2:28 PM.  Appropriate orders placed.  [name] was informed that the remainder of the evaluation will be completed by another provider, this initial triage assessment does not replace that evaluation, and the importance of remaining in the ED until their evaluation is complete.  Labs and imaging ordered.    Achille Rich, PA-C 11/02/22 1429

## 2022-11-02 NOTE — ED Notes (Signed)
Pt called for a room several times, no answer

## 2022-11-02 NOTE — ED Triage Notes (Signed)
Pt to ED via [facility] from home. Pt c/o BLE swelling and abdominal swelling since yesterday. Pt recently started on dialysis yesterday. Pt had full treatment completed yesterday. Pt has hx of CHF. Pt denies pain. Pt denies SOB. Pt denies redness to leg. Pt endorses some chest discomfort this morning but denies any pain upon arrival to ED. Pt denies N/V.   EMS: 144/85 100 HR 96% RA

## 2022-11-05 NOTE — Plan of Care (Signed)
[facility] Tonsillectomy/Adenoidectomy, Postoperative PEDS care plan entered in error.

## 2022-11-07 ENCOUNTER — Encounter (HOSPITAL_COMMUNITY): Payer: Self-pay | Admitting: Pharmacy Technician

## 2022-11-07 ENCOUNTER — Emergency Department (HOSPITAL_COMMUNITY): Payer: Medicare HMO

## 2022-11-07 ENCOUNTER — Emergency Department (HOSPITAL_COMMUNITY)
Admission: EM | Admit: 2022-11-07 | Discharge: 2022-11-08 | Disposition: A | Discharge status: Home / Self Care | Payer: Medicare HMO | Attending: Emergency Medicine | Admitting: Emergency Medicine

## 2022-11-07 DIAGNOSIS — Z992 Dependence on renal dialysis: Secondary | ICD-10-CM | POA: Diagnosis not present

## 2022-11-07 DIAGNOSIS — R0682 Tachypnea, not elsewhere classified: Secondary | ICD-10-CM | POA: Insufficient documentation

## 2022-11-07 DIAGNOSIS — I120 Hypertensive chronic kidney disease with stage 5 chronic kidney disease or end stage renal disease: Secondary | ICD-10-CM | POA: Insufficient documentation

## 2022-11-07 DIAGNOSIS — R109 Unspecified abdominal pain: Secondary | ICD-10-CM | POA: Insufficient documentation

## 2022-11-07 DIAGNOSIS — Z1152 Encounter for screening for COVID-19: Secondary | ICD-10-CM | POA: Diagnosis not present

## 2022-11-07 DIAGNOSIS — Z91010 Allergy to peanuts: Secondary | ICD-10-CM | POA: Diagnosis not present

## 2022-11-07 DIAGNOSIS — R531 Weakness: Secondary | ICD-10-CM | POA: Diagnosis present

## 2022-11-07 DIAGNOSIS — Z79899 Other long term (current) drug therapy: Secondary | ICD-10-CM | POA: Insufficient documentation

## 2022-11-07 DIAGNOSIS — N186 End stage renal disease: Secondary | ICD-10-CM | POA: Diagnosis not present

## 2022-11-07 LAB — CBC WITH DIFFERENTIAL/PLATELET
Abs Immature Granulocytes: 0.04 10*3/uL (ref 0.00–0.07)
Basophils Absolute: 0 10*3/uL (ref 0.0–0.1)
Basophils Relative: 0 %
Eosinophils Absolute: 0.1 10*3/uL (ref 0.0–0.5)
Eosinophils Relative: 1 %
HCT: 28.9 % — ABNORMAL LOW (ref 36.0–46.0)
Hemoglobin: 8.8 g/dL — ABNORMAL LOW (ref 12.0–15.0)
Immature Granulocytes: 1 %
Lymphocytes Relative: 13 %
Lymphs Abs: 0.8 10*3/uL (ref 0.7–4.0)
MCH: 27.6 pg (ref 26.0–34.0)
MCHC: 30.4 g/dL (ref 30.0–36.0)
MCV: 90.6 fL (ref 80.0–100.0)
Monocytes Absolute: 0.4 10*3/uL (ref 0.1–1.0)
Monocytes Relative: 6 %
Neutro Abs: 5.3 10*3/uL (ref 1.7–7.7)
Neutrophils Relative %: 79 %
Platelets: 263 10*3/uL (ref 150–400)
RBC: 3.19 MIL/uL — ABNORMAL LOW (ref 3.87–5.11)
RDW: 14.7 % (ref 11.5–15.5)
WBC: 6.6 10*3/uL (ref 4.0–10.5)
nRBC: 0 % (ref 0.0–0.2)

## 2022-11-07 LAB — COMPREHENSIVE METABOLIC PANEL
ALT: 14 U/L (ref 0–44)
AST: 24 U/L (ref 15–41)
Albumin: 3.7 g/dL (ref 3.5–5.0)
Alkaline Phosphatase: 127 U/L — ABNORMAL HIGH (ref 38–126)
Anion gap: 13 (ref 5–15)
BUN: 15 mg/dL (ref 8–23)
CO2: 24 mmol/L (ref 22–32)
Calcium: 9.8 mg/dL (ref 8.9–10.3)
Chloride: 98 mmol/L (ref 98–111)
Creatinine, Ser: 3.09 mg/dL — ABNORMAL HIGH (ref 0.44–1.00)
GFR, Estimated: 16 mL/min — ABNORMAL LOW (ref >60)
Glucose, Bld: 80 mg/dL (ref 70–99)
Potassium: 3.5 mmol/L (ref 3.5–5.1)
Sodium: 135 mmol/L (ref 135–145)
Total Bilirubin: 0.6 mg/dL (ref 0.3–1.2)
Total Protein: 7.4 g/dL (ref 6.5–8.1)

## 2022-11-07 LAB — SARS CORONAVIRUS 2 BY RT PCR: SARS Coronavirus 2 by RT PCR: NEGATIVE

## 2022-11-07 LAB — BRAIN NATRIURETIC PEPTIDE: B Natriuretic Peptide: 121.5 pg/mL — ABNORMAL HIGH (ref 0.0–100.0)

## 2022-11-07 MED ORDER — ACETAMINOPHEN 500 MG PO TABS
1000.0000 mg | ORAL_TABLET | Freq: Once | ORAL | Status: AC
  Administered 2022-11-07: 1000 mg via ORAL
  Filled 2022-11-07: qty 2

## 2022-11-07 MED ORDER — FENTANYL CITRATE PF 50 MCG/ML IJ SOSY
25.0000 ug | PREFILLED_SYRINGE | Freq: Once | INTRAMUSCULAR | Status: AC
  Administered 2022-11-07: 25 ug via INTRAVENOUS
  Filled 2022-11-07: qty 1

## 2022-11-07 NOTE — ED Provider Notes (Signed)
  Physical Exam  BP (!) 162/83   Pulse 79   Temp 99 F (37.2 C) (Oral)   Resp 17   SpO2 96%   Physical Exam  Procedures  Procedures  ED Course / MDM    Medical Decision Making Amount and/or Complexity of Data Reviewed Labs: ordered. Radiology: ordered.  Risk OTC drugs. Prescription drug management.   Patient received in signout.  Left chest and left flank pain.  CT scans reassuring blood work reassuring.  Not hypoxic.  Appears stable for outpatient follow-up.  Follow-up with PCP.       Benjiman Core, MD 11/07/22 2122

## 2022-11-07 NOTE — ED Provider Notes (Signed)
[name] Provider Note   CSN: [id number] Arrival date & time: 11/07/22  1246     History  Chief Complaint  Patient presents with   Shortness of Breath   Weakness    Tiffany [name] is a 62 y.o. female.  HPI Patient presents from home with concern for shortness of breath, weakness.  Patient also complains of dysuria.  History is notable for end-stage renal disease, started dialysis last week through a chest access.  Yesterday was her first full session.  Today she notes weakness, shortness of breath, fatigue, initially no focal pain, but then she describes pain in the left axilla, left flank.  EMS reports patient was hypertensive in transport, had hypoxia, but did receive nasal cannula for comfort.    Home Medications Prior to Admission medications   Medication Sig Start Date End Date Taking? Authorizing Provider  albuterol (VENTOLIN HFA) 108 (90 Base) MCG/ACT inhaler INHALE 2 PUFFS INTO THE LUNGS EVERY 6 HOURS AS NEEDED FOR WHEEZING OR SHORTNESS OF BREATH 10/24/22   Tiffany Kocher, DO  amLODipine (NORVASC) 10 MG tablet Take 1 tablet (10 mg total) by mouth daily. 10/09/22   McDiarmid, Leighton Roach, MD  atorvastatin (LIPITOR) 20 MG tablet Take 1 tablet (20 mg total) by mouth daily. 10/09/22   McDiarmid, Leighton Roach, MD  calcitRIOL (ROCALTROL) 0.5 MCG capsule Take 1 capsule (0.5 mcg total) by mouth daily. 10/09/22   McDiarmid, Leighton Roach, MD  carvedilol (COREG) 12.5 MG tablet Take 1 tablet (12.5 mg total) by mouth 2 (two) times daily. 10/09/22   McDiarmid, Leighton Roach, MD  cyclobenzaprine (FLEXERIL) 10 MG tablet Take 1 tablet (10 mg total) by mouth 3 (three) times daily. 10/09/22   McDiarmid, Leighton Roach, MD  dicyclomine (BENTYL) 10 MG capsule Take 1 capsule (10 mg total) by mouth 3 (three) times daily before meals. 10/09/22   McDiarmid, Leighton Roach, MD  DULoxetine (CYMBALTA) 30 MG capsule Take 1 capsule (30 mg total) by mouth daily. 10/09/22   McDiarmid, Leighton Roach, MD   escitalopram (LEXAPRO) 20 MG tablet Take 1 tablet (20 mg total) by mouth daily. 10/09/22   McDiarmid, Leighton Roach, MD  ezetimibe (ZETIA) 10 MG tablet Take 1 tablet (10 mg total) by mouth daily. 10/09/22   McDiarmid, Leighton Roach, MD  ferrous sulfate 325 (65 FE) MG tablet Take 1 tablet (325 mg total) by mouth every other day. 10/09/22 10/04/23  McDiarmid, Leighton Roach, MD  Fluticasone-Umeclidin-Vilant (TRELEGY ELLIPTA) 100-62.5-25 MCG/ACT AEPB INHALE 1 PUFF INTO THE LUNGS DAILY 03/20/22   Tiffany Kocher, DO  folic acid (FOLVITE) 1 MG tablet Take 1 tablet (1 mg total) by mouth daily. 10/09/22   McDiarmid, Leighton Roach, MD  ibuprofen (ADVIL) 200 MG tablet Take 400-600 mg by mouth daily.    [provider]  lamoTRIgine (LAMICTAL) 100 MG tablet Take 1 tablet (100 mg total) by mouth daily. 10/09/22   McDiarmid, Leighton Roach, MD  loperamide (IMODIUM) 2 MG capsule Take 1 capsule (2 mg total) by mouth 2 (two) times daily. 10/09/22   McDiarmid, Leighton Roach, MD  nystatin (MYCOSTATIN) 100000 UNIT/ML suspension Take 5 mLs (500,000 Units total) by mouth 4 (four) times daily. 10/03/22   Gwyneth Sprout, MD  OLANZapine (ZYPREXA) 10 MG tablet Take 1 tablet (10 mg total) by mouth at bedtime. 10/09/22   McDiarmid, Leighton Roach, MD  omeprazole (PRILOSEC) 40 MG capsule Take 1 capsule (40 mg total) by mouth every morning. 10/09/22   McDiarmid, Leighton Roach,  MD  potassium chloride SA (KLOR-CON M) 20 MEQ tablet Take 20 mEq by mouth See admin instructions. Take 1 tablet by mouth for 3 days    [provider]  potassium chloride SA (KLOR-CON M) 20 MEQ tablet Take 1 tablet (20 mEq total) by mouth 2 (two) times daily for 5 days. 10/03/22 10/08/22  Gwyneth Sprout, MD  potassium chloride SA (KLOR-CON M) 20 MEQ tablet Take 2 tablets (40 mEq total) by mouth daily for 5 days. 10/17/22     prazosin (MINIPRESS) 2 MG capsule Take 1 capsule (2 mg total) by mouth at bedtime. 10/09/22   McDiarmid, Leighton Roach, MD  SAPHRIS 10 MG SUBL Place 1 tablet (10 mg total) under the tongue  daily. 10/09/22   McDiarmid, Leighton Roach, MD  torsemide (DEMADEX) 20 MG tablet Take 1 tablet (20 mg total) by mouth 2 (two) times daily. 10/10/22   Tiffany Kocher, DO      Allergies    Asa [aspirin], Hydrocodone, Latuda [lurasidone hcl], Magnesium-containing compounds, Prednisone, Ultram [tramadol], Codeine, Desyrel [trazodone], Hydralazine, Peanut (diagnostic), Topamax [topiramate], Sulfa antibiotics, and Tape    Review of Systems   Review of Systems  Physical Exam Updated Vital Signs BP (!) 199/107   Pulse 84   Temp (S) 99.2 F (37.3 C) (Rectal)   Resp (!) 40   SpO2 99%  Physical Exam Vitals and nursing note reviewed.  Constitutional:      General: [name] not in acute distress.    Appearance: [name] well-developed.  HENT:     Head: Normocephalic and atraumatic.  Eyes:     Conjunctiva/sclera: Conjunctivae normal.  Cardiovascular:     Rate and Rhythm: Normal rate and regular rhythm.  Pulmonary:     Effort: Pulmonary effort is normal. Tachypnea present. No respiratory distress.     Breath sounds: Normal breath sounds. No stridor.  Chest:     Comments: Right upper chest port unremarkable Abdominal:     General: There is no distension.  Skin:    General: Skin is warm and dry.  Neurological:     Mental Status: [name] alert and oriented to person, place, and time.     Cranial Nerves: No cranial nerve deficit.  Psychiatric:        Mood and Affect: Mood normal.     ED Results / Procedures / Treatments   Labs (all labs ordered are listed, but only abnormal results are displayed) Labs Reviewed  COMPREHENSIVE METABOLIC PANEL - Abnormal; Notable for the following components:      Result Value   Creatinine, Ser 3.09 (*)    Alkaline Phosphatase 127 (*)    GFR, Estimated 16 (*)    All other components within normal limits  CBC WITH DIFFERENTIAL/PLATELET - Abnormal; Notable for the following components:   RBC 3.19 (*)    Hemoglobin 8.8 (*)    HCT 28.9 (*)    All other  components within normal limits  BRAIN NATRIURETIC PEPTIDE - Abnormal; Notable for the following components:   B Natriuretic Peptide 121.5 (*)    All other components within normal limits  SARS CORONAVIRUS 2 BY RT PCR    EKG EKG Interpretation Date/Time:  Tuesday November 07 2022 13:01:52 EDT Ventricular Rate:  84 PR Interval:  153 QRS Duration:  85 QT Interval:  407 QTC Calculation: 482 R Axis:   -19  Text Interpretation: Sinus rhythm Borderline left axis deviation Confirmed by Gerhard Munch [phone]) on 11/07/2022 1:05:32 PM  Radiology No results  found.  Procedures Procedures    Medications Ordered in ED Medications  fentaNYL (SUBLIMAZE) injection 25 mcg (has no administration in time range)  acetaminophen (TYLENOL) tablet 1,000 mg (1,000 mg Oral Given 11/07/22 1409)    ED Course/ Medical Decision Making/ A&P                                 Medical Decision Making Adult female with multiple medical issues including hypertension, end-stage renal disease presents with shortness of breath, weakness, dysuria.  Patient is minimally febrile on arrival, in no distress, does have mild tachypnea.  Suspicion for new dialysis versus infection versus bacteremia or sepsis.  Patient eventually also describes left flank and axillary pain, concerning for pneumonia versus kidney stone.  Patient received analgesics, continuous monitoring, on signout studies pending. Cardiac 85 sinus normal Pulse ox 99% room air normal   Amount and/or Complexity of Data Reviewed Independent Historian: EMS External Data Reviewed: notes. Labs: ordered. Decision-making details documented in ED Course. Radiology: ordered and independent interpretation performed. Decision-making details documented in ED Course. ECG/medicine tests: ordered and independent interpretation performed. Decision-making details documented in ED Course.  Risk OTC drugs. Prescription drug management. Decision regarding  hospitalization. Diagnosis or treatment significantly limited by social determinants of health.   3:47 PM Patient now complains of pain in the left axilla, left flank, has included above, CT scans pending, patient awaiting results on signout.        Final Clinical Impression(s) / ED Diagnoses Final diagnoses:  Weakness  Acute left flank pain     Gerhard Munch, MD 11/07/22 [phone]

## 2022-11-07 NOTE — ED Triage Notes (Signed)
Pt bib ems from home with reports of shob. Placed on 2L [place] by ems for comfort. Recently started on dialysis on the 16th of this month. MWF. Pt also with with increased weakness and painful urination.  BP 174/110 HR 85 RR 22 CBG 120

## 2022-11-07 NOTE — ED Notes (Signed)
[facility] called for transport  to address on file.

## 2022-11-07 NOTE — ED Notes (Signed)
Report received, assumed care of patient at this time.  

## 2022-11-07 NOTE — ED Notes (Signed)
Patient transported to CT 

## 2022-11-08 MED ORDER — ONDANSETRON 4 MG PO TBDP
4.0000 mg | ORAL_TABLET | Freq: Once | ORAL | Status: AC
  Administered 2022-11-08: 4 mg via ORAL
  Filled 2022-11-08: qty 1

## 2022-11-08 MED ORDER — LIDOCAINE 5 % EX PTCH
1.0000 | MEDICATED_PATCH | CUTANEOUS | Status: DC
  Administered 2022-11-08: 1 via TRANSDERMAL
  Filled 2022-11-08: qty 1

## 2022-11-09 ENCOUNTER — Other Ambulatory Visit: Payer: Self-pay

## 2022-11-09 ENCOUNTER — Other Ambulatory Visit (HOSPITAL_COMMUNITY): Payer: Self-pay

## 2022-11-09 MED ORDER — TORSEMIDE 20 MG PO TABS
20.0000 mg | ORAL_TABLET | Freq: Two times a day (BID) | ORAL | 2 refills | Status: DC
  Filled 2022-11-09 – 2023-01-30 (×6): qty 60, 30d supply, fill #0

## 2022-11-10 ENCOUNTER — Other Ambulatory Visit: Payer: Self-pay

## 2022-11-10 ENCOUNTER — Inpatient Hospital Stay
Admission: AD | Admit: 2022-11-10 | Discharge: 2022-11-22 | DRG: 885 | Disposition: A | Discharge status: Home / Self Care | Payer: Medicare HMO | Source: Intra-hospital | Attending: Psychiatry | Admitting: Psychiatry

## 2022-11-10 ENCOUNTER — Emergency Department (HOSPITAL_BASED_OUTPATIENT_CLINIC_OR_DEPARTMENT_OTHER)
Admission: EM | Admit: 2022-11-10 | Discharge: 2022-11-10 | Disposition: A | Discharge status: Home / Self Care | Payer: Medicare HMO | Attending: Emergency Medicine | Admitting: Emergency Medicine

## 2022-11-10 ENCOUNTER — Encounter (HOSPITAL_BASED_OUTPATIENT_CLINIC_OR_DEPARTMENT_OTHER): Payer: Self-pay | Admitting: Emergency Medicine

## 2022-11-10 ENCOUNTER — Encounter: Payer: Self-pay | Admitting: Family Medicine

## 2022-11-10 DIAGNOSIS — Z79899 Other long term (current) drug therapy: Secondary | ICD-10-CM | POA: Insufficient documentation

## 2022-11-10 DIAGNOSIS — D631 Anemia in chronic kidney disease: Secondary | ICD-10-CM | POA: Diagnosis present

## 2022-11-10 DIAGNOSIS — N186 End stage renal disease: Secondary | ICD-10-CM | POA: Diagnosis present

## 2022-11-10 DIAGNOSIS — R45850 Homicidal ideations: Secondary | ICD-10-CM | POA: Diagnosis present

## 2022-11-10 DIAGNOSIS — K580 Irritable bowel syndrome with diarrhea: Secondary | ICD-10-CM | POA: Diagnosis present

## 2022-11-10 DIAGNOSIS — F4312 Post-traumatic stress disorder, chronic: Secondary | ICD-10-CM | POA: Diagnosis present

## 2022-11-10 DIAGNOSIS — E1140 Type 2 diabetes mellitus with diabetic neuropathy, unspecified: Secondary | ICD-10-CM | POA: Diagnosis present

## 2022-11-10 DIAGNOSIS — J9612 Chronic respiratory failure with hypercapnia: Secondary | ICD-10-CM | POA: Diagnosis present

## 2022-11-10 DIAGNOSIS — F251 Schizoaffective disorder, depressive type: Secondary | ICD-10-CM | POA: Diagnosis not present

## 2022-11-10 DIAGNOSIS — I5032 Chronic diastolic (congestive) heart failure: Secondary | ICD-10-CM | POA: Diagnosis present

## 2022-11-10 DIAGNOSIS — Z992 Dependence on renal dialysis: Secondary | ICD-10-CM | POA: Insufficient documentation

## 2022-11-10 DIAGNOSIS — Z91010 Allergy to peanuts: Secondary | ICD-10-CM

## 2022-11-10 DIAGNOSIS — Z8249 Family history of ischemic heart disease and other diseases of the circulatory system: Secondary | ICD-10-CM

## 2022-11-10 DIAGNOSIS — G4733 Obstructive sleep apnea (adult) (pediatric): Secondary | ICD-10-CM | POA: Diagnosis present

## 2022-11-10 DIAGNOSIS — F22 Delusional disorders: Secondary | ICD-10-CM

## 2022-11-10 DIAGNOSIS — F250 Schizoaffective disorder, bipolar type: Secondary | ICD-10-CM | POA: Diagnosis present

## 2022-11-10 DIAGNOSIS — F259 Schizoaffective disorder, unspecified: Secondary | ICD-10-CM | POA: Insufficient documentation

## 2022-11-10 DIAGNOSIS — Z882 Allergy status to sulfonamides status: Secondary | ICD-10-CM

## 2022-11-10 DIAGNOSIS — Z7951 Long term (current) use of inhaled steroids: Secondary | ICD-10-CM

## 2022-11-10 DIAGNOSIS — F1911 Other psychoactive substance abuse, in remission: Secondary | ICD-10-CM | POA: Diagnosis present

## 2022-11-10 DIAGNOSIS — Z8673 Personal history of transient ischemic attack (TIA), and cerebral infarction without residual deficits: Secondary | ICD-10-CM

## 2022-11-10 DIAGNOSIS — Z885 Allergy status to narcotic agent status: Secondary | ICD-10-CM

## 2022-11-10 DIAGNOSIS — F419 Anxiety disorder, unspecified: Secondary | ICD-10-CM | POA: Diagnosis present

## 2022-11-10 DIAGNOSIS — J439 Emphysema, unspecified: Secondary | ICD-10-CM | POA: Diagnosis present

## 2022-11-10 DIAGNOSIS — G894 Chronic pain syndrome: Secondary | ICD-10-CM | POA: Diagnosis present

## 2022-11-10 DIAGNOSIS — Z9981 Dependence on supplemental oxygen: Secondary | ICD-10-CM

## 2022-11-10 DIAGNOSIS — K219 Gastro-esophageal reflux disease without esophagitis: Secondary | ICD-10-CM | POA: Diagnosis present

## 2022-11-10 DIAGNOSIS — I132 Hypertensive heart and chronic kidney disease with heart failure and with stage 5 chronic kidney disease, or end stage renal disease: Secondary | ICD-10-CM | POA: Diagnosis present

## 2022-11-10 DIAGNOSIS — E1122 Type 2 diabetes mellitus with diabetic chronic kidney disease: Secondary | ICD-10-CM | POA: Diagnosis present

## 2022-11-10 DIAGNOSIS — M109 Gout, unspecified: Secondary | ICD-10-CM | POA: Diagnosis present

## 2022-11-10 DIAGNOSIS — Z801 Family history of malignant neoplasm of trachea, bronchus and lung: Secondary | ICD-10-CM

## 2022-11-10 DIAGNOSIS — Z62810 Personal history of physical and sexual abuse in childhood: Secondary | ICD-10-CM

## 2022-11-10 DIAGNOSIS — I120 Hypertensive chronic kidney disease with stage 5 chronic kidney disease or end stage renal disease: Secondary | ICD-10-CM | POA: Insufficient documentation

## 2022-11-10 DIAGNOSIS — J9611 Chronic respiratory failure with hypoxia: Secondary | ICD-10-CM | POA: Diagnosis present

## 2022-11-10 DIAGNOSIS — Z818 Family history of other mental and behavioral disorders: Secondary | ICD-10-CM

## 2022-11-10 DIAGNOSIS — Z791 Long term (current) use of non-steroidal anti-inflammatories (NSAID): Secondary | ICD-10-CM

## 2022-11-10 DIAGNOSIS — Z888 Allergy status to other drugs, medicaments and biological substances status: Secondary | ICD-10-CM

## 2022-11-10 DIAGNOSIS — Z5941 Food insecurity: Secondary | ICD-10-CM

## 2022-11-10 DIAGNOSIS — F4310 Post-traumatic stress disorder, unspecified: Secondary | ICD-10-CM | POA: Diagnosis not present

## 2022-11-10 DIAGNOSIS — E785 Hyperlipidemia, unspecified: Secondary | ICD-10-CM | POA: Diagnosis present

## 2022-11-10 DIAGNOSIS — I252 Old myocardial infarction: Secondary | ICD-10-CM | POA: Diagnosis not present

## 2022-11-10 DIAGNOSIS — Z886 Allergy status to analgesic agent status: Secondary | ICD-10-CM

## 2022-11-10 DIAGNOSIS — Z91048 Other nonmedicinal substance allergy status: Secondary | ICD-10-CM

## 2022-11-10 DIAGNOSIS — Z8674 Personal history of sudden cardiac arrest: Secondary | ICD-10-CM

## 2022-11-10 LAB — COMPREHENSIVE METABOLIC PANEL
ALT: 11 U/L (ref 0–44)
AST: 18 U/L (ref 15–41)
Albumin: 3.8 g/dL (ref 3.5–5.0)
Alkaline Phosphatase: 113 U/L (ref 38–126)
Anion gap: 10 (ref 5–15)
BUN: 17 mg/dL (ref 8–23)
CO2: 28 mmol/L (ref 22–32)
Calcium: 9.4 mg/dL (ref 8.9–10.3)
Chloride: 98 mmol/L (ref 98–111)
Creatinine, Ser: 3.83 mg/dL — ABNORMAL HIGH (ref 0.44–1.00)
GFR, Estimated: 13 mL/min — ABNORMAL LOW (ref >60)
Glucose, Bld: 88 mg/dL (ref 70–99)
Potassium: 3.4 mmol/L — ABNORMAL LOW (ref 3.5–5.1)
Sodium: 136 mmol/L (ref 135–145)
Total Bilirubin: 0.4 mg/dL (ref 0.3–1.2)
Total Protein: 7.1 g/dL (ref 6.5–8.1)

## 2022-11-10 LAB — CBC
HCT: 26.6 % — ABNORMAL LOW (ref 36.0–46.0)
Hemoglobin: 8.2 g/dL — ABNORMAL LOW (ref 12.0–15.0)
MCH: 28.4 pg (ref 26.0–34.0)
MCHC: 30.8 g/dL (ref 30.0–36.0)
MCV: 92 fL (ref 80.0–100.0)
Platelets: 274 10*3/uL (ref 150–400)
RBC: 2.89 MIL/uL — ABNORMAL LOW (ref 3.87–5.11)
RDW: 14.9 % (ref 11.5–15.5)
WBC: 8 10*3/uL (ref 4.0–10.5)
nRBC: 0 % (ref 0.0–0.2)

## 2022-11-10 LAB — ACETAMINOPHEN LEVEL: Acetaminophen (Tylenol), Serum: <10 ug/mL — ABNORMAL LOW (ref 10–30)

## 2022-11-10 LAB — SALICYLATE LEVEL: Salicylate Lvl: <7 mg/dL — ABNORMAL LOW (ref 7.0–30.0)

## 2022-11-10 LAB — ETHANOL: Alcohol, Ethyl (B): <10 mg/dL (ref <10)

## 2022-11-10 MED ORDER — AMLODIPINE BESYLATE 5 MG PO TABS
10.0000 mg | ORAL_TABLET | Freq: Every day | ORAL | Status: DC
  Administered 2022-11-10 – 2022-11-19 (×9): 10 mg via ORAL
  Filled 2022-11-10 (×11): qty 2

## 2022-11-10 MED ORDER — LAMOTRIGINE 100 MG PO TABS
100.0000 mg | ORAL_TABLET | Freq: Every day | ORAL | Status: DC
  Administered 2022-11-10 – 2022-11-19 (×9): 100 mg via ORAL
  Filled 2022-11-10 (×9): qty 1

## 2022-11-10 MED ORDER — PANTOPRAZOLE SODIUM 40 MG PO TBEC
80.0000 mg | DELAYED_RELEASE_TABLET | Freq: Every day | ORAL | Status: DC
  Administered 2022-11-11 – 2022-11-22 (×11): 80 mg via ORAL
  Filled 2022-11-10 (×11): qty 2

## 2022-11-10 MED ORDER — CARVEDILOL 6.25 MG PO TABS
12.5000 mg | ORAL_TABLET | Freq: Two times a day (BID) | ORAL | Status: DC
  Administered 2022-11-10 – 2022-11-22 (×21): 12.5 mg via ORAL
  Filled 2022-11-10 (×21): qty 2

## 2022-11-10 MED ORDER — DIPHENHYDRAMINE HCL 50 MG/ML IJ SOLN: 50.0000 mg | Freq: Three times a day (TID) | INTRAMUSCULAR | Status: DC | PRN

## 2022-11-10 MED ORDER — OLANZAPINE 5 MG PO TABS
10.0000 mg | ORAL_TABLET | Freq: Every day | ORAL | Status: DC
  Filled 2022-11-10 (×4): qty 2

## 2022-11-10 MED ORDER — PRAZOSIN HCL 2 MG PO CAPS
2.0000 mg | ORAL_CAPSULE | Freq: Every day | ORAL | Status: DC
  Filled 2022-11-10 (×5): qty 1

## 2022-11-10 MED ORDER — DIPHENHYDRAMINE HCL 25 MG PO CAPS
50.0000 mg | ORAL_CAPSULE | Freq: Three times a day (TID) | ORAL | Status: DC | PRN
Start: 2022-11-10 — End: 2022-11-16
  Administered 2022-11-11 – 2022-11-15 (×5): 50 mg via ORAL
  Filled 2022-11-10 (×5): qty 2

## 2022-11-10 MED ORDER — DULOXETINE HCL 30 MG PO CPEP
30.0000 mg | ORAL_CAPSULE | Freq: Every day | ORAL | Status: DC
  Administered 2022-11-10 – 2022-11-22 (×12): 30 mg via ORAL
  Filled 2022-11-10 (×13): qty 1

## 2022-11-10 MED ORDER — FOLIC ACID 1 MG PO TABS
1.0000 mg | ORAL_TABLET | Freq: Every day | ORAL | Status: DC
  Administered 2022-11-10 – 2022-11-22 (×12): 1 mg via ORAL
  Filled 2022-11-10 (×12): qty 1

## 2022-11-10 MED ORDER — ATORVASTATIN CALCIUM 10 MG PO TABS
20.0000 mg | ORAL_TABLET | Freq: Every day | ORAL | Status: DC
  Administered 2022-11-10 – 2022-11-22 (×12): 20 mg via ORAL
  Filled 2022-11-10 (×13): qty 2

## 2022-11-10 MED ORDER — FLUTICASONE FUROATE-VILANTEROL 100-25 MCG/ACT IN AEPB
1.0000 | INHALATION_SPRAY | Freq: Every day | RESPIRATORY_TRACT | Status: DC
  Administered 2022-11-10 – 2022-11-22 (×12): 1 via RESPIRATORY_TRACT
  Filled 2022-11-10: qty 28

## 2022-11-10 MED ORDER — ASENAPINE MALEATE 5 MG SL SUBL
5.0000 mg | SUBLINGUAL_TABLET | Freq: Two times a day (BID) | SUBLINGUAL | Status: DC
  Administered 2022-11-10 – 2022-11-17 (×13): 5 mg via SUBLINGUAL
  Filled 2022-11-10 (×17): qty 1

## 2022-11-10 MED ORDER — UMECLIDINIUM BROMIDE 62.5 MCG/ACT IN AEPB
1.0000 | INHALATION_SPRAY | Freq: Every day | RESPIRATORY_TRACT | Status: DC
  Administered 2022-11-10 – 2022-11-22 (×12): 1 via RESPIRATORY_TRACT
  Filled 2022-11-10 (×2): qty 7

## 2022-11-10 MED ORDER — CALCITRIOL 0.25 MCG PO CAPS
0.5000 ug | ORAL_CAPSULE | Freq: Every day | ORAL | Status: DC
  Administered 2022-11-10 – 2022-11-22 (×12): 0.5 ug via ORAL
  Filled 2022-11-10 (×15): qty 2

## 2022-11-10 MED ORDER — INFLUENZA VIRUS VACC SPLIT PF (FLUZONE) 0.5 ML IM SUSY
0.5000 mL | PREFILLED_SYRINGE | INTRAMUSCULAR | Status: DC
  Filled 2022-11-10: qty 0.5

## 2022-11-10 MED ORDER — LORAZEPAM 2 MG/ML IJ SOLN: 2.0000 mg | Freq: Three times a day (TID) | INTRAMUSCULAR | Status: DC | PRN

## 2022-11-10 MED ORDER — FERROUS SULFATE 325 (65 FE) MG PO TABS
325.0000 mg | ORAL_TABLET | ORAL | Status: DC
  Administered 2022-11-11 – 2022-11-21 (×6): 325 mg via ORAL
  Filled 2022-11-10 (×6): qty 1

## 2022-11-10 MED ORDER — LORAZEPAM 1 MG PO TABS
2.0000 mg | ORAL_TABLET | Freq: Three times a day (TID) | ORAL | Status: DC | PRN
Start: 2022-11-10 — End: 2022-11-16
  Administered 2022-11-10 – 2022-11-15 (×5): 2 mg via ORAL
  Filled 2022-11-10 (×6): qty 2

## 2022-11-10 MED ORDER — HALOPERIDOL 5 MG PO TABS
5.0000 mg | ORAL_TABLET | Freq: Three times a day (TID) | ORAL | Status: DC | PRN
  Administered 2022-11-11 – 2022-11-16 (×6): 5 mg via ORAL
  Filled 2022-11-10 (×6): qty 1

## 2022-11-10 MED ORDER — HYDROXYZINE HCL 25 MG PO TABS
25.0000 mg | ORAL_TABLET | Freq: Three times a day (TID) | ORAL | Status: DC | PRN
  Administered 2022-11-11 – 2022-11-22 (×7): 25 mg via ORAL
  Filled 2022-11-10 (×8): qty 1

## 2022-11-10 MED ORDER — ACETAMINOPHEN 325 MG PO TABS
650.0000 mg | ORAL_TABLET | Freq: Four times a day (QID) | ORAL | Status: DC | PRN
  Administered 2022-11-11 – 2022-11-20 (×13): 650 mg via ORAL
  Filled 2022-11-10 (×13): qty 2

## 2022-11-10 MED ORDER — DICYCLOMINE HCL 10 MG PO CAPS
10.0000 mg | ORAL_CAPSULE | Freq: Three times a day (TID) | ORAL | Status: DC
  Administered 2022-11-10 – 2022-11-22 (×28): 10 mg via ORAL
  Filled 2022-11-10 (×38): qty 1

## 2022-11-10 MED ORDER — HALOPERIDOL LACTATE 5 MG/ML IJ SOLN: 5.0000 mg | Freq: Three times a day (TID) | INTRAMUSCULAR | Status: DC | PRN

## 2022-11-10 MED ORDER — ALBUTEROL SULFATE HFA 108 (90 BASE) MCG/ACT IN AERS
2.0000 | INHALATION_SPRAY | Freq: Four times a day (QID) | RESPIRATORY_TRACT | Status: DC | PRN
  Administered 2022-11-18 – 2022-11-22 (×4): 2 via RESPIRATORY_TRACT
  Filled 2022-11-10: qty 6.7

## 2022-11-10 MED ORDER — TORSEMIDE 20 MG PO TABS
20.0000 mg | ORAL_TABLET | Freq: Two times a day (BID) | ORAL | Status: DC
  Administered 2022-11-10 – 2022-11-22 (×20): 20 mg via ORAL
  Filled 2022-11-10 (×25): qty 1

## 2022-11-10 MED ORDER — ESCITALOPRAM OXALATE 10 MG PO TABS
20.0000 mg | ORAL_TABLET | Freq: Every day | ORAL | Status: DC
  Administered 2022-11-10 – 2022-11-22 (×13): 20 mg via ORAL
  Filled 2022-11-10 (×12): qty 2

## 2022-11-10 NOTE — ED Notes (Signed)
Pt requesting dentures from her personal items at this time. Dentures given to pt.

## 2022-11-10 NOTE — ED Notes (Signed)
Pt provided with denture adhesive at this time.

## 2022-11-10 NOTE — ED Notes (Signed)
Trash from meal discarded of properly.

## 2022-11-10 NOTE — Progress Notes (Signed)
Patient reports dialysis on M,W,F but did not receive treatment today. The hope is to get treatment tomorrow.

## 2022-11-10 NOTE — ED Notes (Addendum)
Pt cleaned by this Clinical research associate and Charity fundraiser, Merry Proud.Full bed change and hospital scrubs as well as brief changed, purewick placed on pt at this time. Warm blankets provided. Pt provided with basin of water and two washcloths to wash face. Pt provided with toothbrush and toothpaste and these items were discarded of properly.

## 2022-11-10 NOTE — Tx Team (Signed)
Initial Treatment Plan 11/10/2022 6:12 PM Tiffany [name] ZOX:[id number]    PATIENT STRESSORS: Health problems   Marital or family conflict     PATIENT STRENGTHS: Ability for insight  Communication skills  Motivation for treatment/growth    PATIENT IDENTIFIED PROBLEMS:   'HI toward my family"  "I don't have any goals right now. Too much fog."                 DISCHARGE CRITERIA:  Ability to meet basic life and health needs Adequate post-discharge living arrangements Improved stabilization in mood, thinking, and/or behavior Safe-care adequate arrangements made  PRELIMINARY DISCHARGE PLAN: Attend aftercare/continuing care group Return to previous living arrangement  PATIENT/FAMILY INVOLVEMENT: This treatment plan has been presented to and reviewed with the patient, Tiffany [name]. The patient has been given the opportunity to ask questions and make suggestions.  Luane School, RN 11/10/2022, 6:12 PM

## 2022-11-10 NOTE — Group Note (Signed)
Recreation Therapy Group Note   Group Topic:Leisure Education  Group Date: 11/10/2022 Start Time: 1400 End Time: 1445 Facilitators: Rosina Lowenstein, LRT, CTRS Location: Courtyard  Group Description: Leisure. Patients were given the opportunity to play ring toss, play corn hole, or listen to music while sitting in the courtyard getting fresh air and sunlight. Pt identified and conversated about things they enjoy doing in their free time and how they can continue to do that outside of the hospital.  Goal Area(s) Addressed: Patient will learn the definition of "leisure". Patient will practice making a positive decision. Patient will have the opportunity to try a new leisure activity.   Affect/Mood: N/A   Participation Level: Did not attend    Clinical Observations/Individualized Feedback: Jonasia did not attend group due to not being on the unit yet.   Plan: Continue to engage patient in RT group sessions 2-3x/week.   Rosina Lowenstein, LRT, CTRS 11/10/2022 3:22 PM

## 2022-11-10 NOTE — ED Notes (Addendum)
Pt provided with oatmeal and grits at this time, two plastic spoons were provided with this. Drink also provided at this time in a styrofoam cup (236.43ml provided).

## 2022-11-10 NOTE — Plan of Care (Signed)
  Problem: Education: Goal: Knowledge of [place] [facility] Education information/materials will improve Outcome: Not Progressing Goal: Emotional status will improve Outcome: Not Progressing Goal: Mental status will improve Outcome: Not Progressing Goal: Verbalization of understanding the information provided will improve Outcome: Not Progressing   Problem: Activity: Goal: Interest or engagement in activities will improve Outcome: Not Progressing Goal: Sleeping patterns will improve Outcome: Not Progressing   Problem: Coping: Goal: Ability to verbalize frustrations and anger appropriately will improve Outcome: Not Progressing Goal: Ability to demonstrate self-control will improve Outcome: Not Progressing   

## 2022-11-10 NOTE — BH Assessment (Signed)
Comprehensive Clinical Assessment (CCA) Note  11/10/2022 Tiffany [name] [id number] DISPOSITION: Tiburcio Pea NP recommends an inpatient admission to assist with stabilization.   The patient demonstrates the following risk factors for suicide: Chronic risk factors for suicide include: N/A. Acute risk factors for suicide include: N/A. Protective factors for this patient include: coping skills. Considering these factors, the overall suicide risk at this point appears to be low. Patient is appropriate for outpatient follow up.   Patient presents voluntary to [facility] with a history significant for paranoia, schizoaffective disorder, type 2 diabetes, CHF, CKD and COPD. Patient denies any S/I on arrival although reports active H/I. Patient is vague in reference to who she wants to harm. Patient denies any active intent or plan to harm others. When asked patient reports she, "just wants to hurt everyone." Patient denies any AH although reports active VH for the last two weeks stating she, "sees people who look like devils." Patient denies any SA history although per chart review has used substances in the past. Patient is observed to be a poor historian and renders limited information this date. UDS pending this date.   Patient reports her OP psychiatric medications (See MAR) are managed by her PCP and she hasn't seen a psychiatrist in 2 years because she, "never could get a ride there." Patient reports she is on disability and resides alone. Patient reports current medication compliance although states she, "still doesn't sleep because of nightmares." Again patient renders limited history and is vague in reference to current mental health symptoms. Patient states she has two adult children that she has limited contact with and a "mother she wants to hurt at times." Per chart review patient has been seen before presenting with similar symptoms in 2023 which required an inpatient admission at [facility] at that time.  Patient denies any current legal issues or access to weapons.    [name] alert/oriented x 4; calm, and somewhat cooperative.  Patient's memory appears to partially be intact although thoughts somewhat disorganized. Her mood is anxious/suspicious with affect congruent. [name] speaking in a clear tone at moderate volume, and normal pace; with good eye contact. There is no indication that she is currently responding to internal/external stimuli or experiencing delusional thought content other than her statement that she had been seeing things. Patient is requesting an voluntary admission to assist with stabilization.      Chief Complaint:  Chief Complaint  Patient presents with   Paranoid   Homicidal   Visit Diagnosis: Schizoaffective Disorder     CCA Screening, Triage and Referral (STR)  Patient Reported Information How did you hear about [name]? Self  What Is the Reason for Your Visit/Call Today? Pt BIB [facility] from home for paranoia, pt reports she was recently diagnosed with end stage renal failure and is now requiring dialysis (M/W/F), last had dialysis W, pt reports she called mobile crisis due to paranoia, depressed, and HI toward her family, denies SI  How Long Has This Been Causing You Problems? 1 wk - 1 month  What Do You Feel Would Help You the Most Today? Treatment for Depression or other mood problem   Have You Recently Had Any Thoughts About Hurting Yourself? No  Are You Planning to Commit Suicide/Harm Yourself At This time? No   Flowsheet Row ED from 11/10/2022 in [facility] Department at [facility] ED from 11/07/2022 in [facility] Emergency Department at [facility] ED from 11/02/2022 in [facility] Department at [facility]  C-SSRS RISK CATEGORY No Risk No Risk No Risk       Have you Recently Had Thoughts About Hurting Someone Karolee Ohs? Yes  Are You Planning to Harm Someone at This Time? No  Explanation: Pt reported H/I at the  time of assessment although will not elaborate on details   Have You Used Any Alcohol or Drugs in the Past 24 Hours? No  What Did You Use and How Much? NA   Do You Currently Have a Therapist/Psychiatrist? No  Name of Therapist/Psychiatrist: Name of Therapist/Psychiatrist: NA   Have You Been Recently Discharged From Any Office Practice or Programs? No  Explanation of Discharge From Practice/Program: NA     CCA Screening Triage Referral Assessment Type of Contact: Tele-Assessment  Telemedicine Service Delivery: Telemedicine service delivery: This service was provided via telemedicine using a 2-way, interactive audio and video technology  Is this Initial or Reassessment? Is this Initial or Reassessment?: Initial Assessment  Date Telepsych consult ordered in [facility]:  Date Telepsych consult ordered in [facility]: 11/10/22  Time Telepsych consult ordered in [facility]:  Time Telepsych consult ordered in [facility]: 0230  Location of Assessment: Other (comment) ([facility])  Provider Location: [facility] Assessment Services   Collateral Involvement: None at this time   Does Patient Have a Automotive engineer Guardian? No  Legal Guardian Contact Information: NA  Copy of Legal Guardianship Form: -- (NA)  Legal Guardian Notified of Arrival: -- (NA)  Legal Guardian Notified of Pending Discharge: -- (NA)  If Minor and Not Living with Parent(s), Who has Custody? NA  Is CPS involved or ever been involved? Never  Is APS involved or ever been involved? Never   Patient Determined To Be At Risk for Harm To Self or Others Based on Review of Patient Reported Information or Presenting Complaint? No  Method: No Plan  Availability of Means: No access or NA  Intent: Vague intent or NA  Notification Required: No need or identified person  Additional Information for Danger to Others Potential: -- (NA)  Additional Comments for Danger to Others Potential: None noted  Are There Guns or Other Weapons in  Your Home? No  Types of Guns/Weapons: NA  Are These Weapons Safely Secured?                            -- (NA)  Who Could Verify You Are Able To Have These Secured: NA  Do You Have any Outstanding Charges, Pending Court Dates, Parole/Probation? Pt denies  Contacted To Inform of Risk of Harm To Self or Others: Other: Comment (NA)    Does Patient Present under Involuntary Commitment? No    [place] of Residence: [place]   Patient Currently Receiving the Following Services: Medication Management   Determination of Need: Urgent (48 hours)   Options For Referral: Inpatient Hospitalization     CCA Biopsychosocial Patient Reported Schizophrenia/Schizoaffective Diagnosis in Past: Yes   Strengths: Patient is willing to participate in treatment   Mental Health Symptoms Depression:   Hopelessness; Fatigue; Change in energy/activity; Worthlessness   Duration of Depressive symptoms:  Duration of Depressive Symptoms: Greater than two weeks   Mania:   Racing thoughts   Anxiety:    Irritability; Fatigue; Worrying   Psychosis:   Hallucinations   Duration of Psychotic symptoms:  Duration of Psychotic Symptoms: Less than six months   Trauma:   None   Obsessions:   None   Compulsions:   None  Inattention:   None   Hyperactivity/Impulsivity:   None   Oppositional/Defiant Behaviors:   None   Emotional Irregularity:   Chronic feelings of emptiness   Other Mood/Personality Symptoms:   None noted    Mental Status Exam Appearance and self-care  Stature:   Average   Weight:   Average weight   Clothing:   Casual   Grooming:   Normal   Cosmetic use:   None   Posture/gait:   Normal   Motor activity:   Slowed; Restless   Sensorium  Attention:   Normal   Concentration:   Scattered   Orientation:   X5   Recall/memory:   Defective in Short-term   Affect and Mood  Affect:   Depressed; Anxious   Mood:   Depressed; Anxious    Relating  Eye contact:   Normal   Facial expression:   Depressed; Anxious   Attitude toward examiner:   Cooperative   Thought and Language  Speech flow:  Clear and Coherent   Thought content:   Suspicious   Preoccupation:   Somatic; Ruminations; Obsessions   Hallucinations:   Visual   Organization:   Irrelevant   Company secretary of Knowledge:   Poor   Intelligence:   Average   Abstraction:   Functional   Judgement:   Poor   Reality Testing:   Realistic   Insight:   Gaps   Decision Making:   Only simple   Social Functioning  Social Maturity:   Impulsive   Social Judgement:   Normal   Stress  Stressors:   Illness   Coping Ability:   Overwhelmed   Skill Deficits:   Self-care   Supports:   Friends/Service system; Support needed     Religion: Religion/Spirituality Are You A Religious Person?: Yes What is Your Religious Affiliation?: Christian How Might This Affect Treatment?: pt denied  Leisure/Recreation: Leisure / Recreation Do You Have Hobbies?: No  Exercise/Diet: Exercise/Diet Do You Exercise?: No Have You Gained or Lost A Significant Amount of Weight in the Past Six Months?: No Do You Follow a Special Diet?: No Do You Have Any Trouble Sleeping?: Yes Explanation of Sleeping Difficulties: Pt states she can't sleep because of "nightmares"   CCA Employment/Education Employment/Work Situation: Employment / Work Situation Employment Situation: On disability Why is Patient on Disability: Mental and physical How Long has Patient Been on Disability: 12 years Patient's Job has Been Impacted by Current Illness: No Has Patient ever Been in the [employer]?: No  Education: Education Is Patient Currently Attending School?: No Last Grade Completed: 12 Did You Attend College?: Yes What Type of College Degree Do you Have?: Pt states she has, "some college" Did You Have An Individualized Education Program (IIEP): No Did  You Have Any Difficulty At School?: No Patient's Education Has Been Impacted by Current Illness: No   CCA Family/Childhood History Family and Relationship History: Family history Marital status: Single Does patient have children?: Yes How many children?: 2 How is patient's relationship with their children?: Pt states she "sees her kids once in a while."  Childhood History:  Childhood History By whom was/is the patient raised?: Both parents Did patient suffer any verbal/emotional/physical/sexual abuse as a child?: No Did patient suffer from severe childhood neglect?: No Has patient ever been sexually abused/assaulted/raped as an adolescent or adult?: No Was the patient ever a victim of a crime or a disaster?: No Witnessed domestic violence?: No Has patient been affected by domestic violence as an  adult?: No Description of domestic violence: NA       CCA Substance Use Alcohol/Drug Use: Alcohol / Drug Use Pain Medications: See PTA medication list Prescriptions: See PTA medication list Over the Counter: See PTA medication list History of alcohol / drug use?: No history of alcohol / drug abuse Longest period of sobriety (when/how long): NA Negative Consequences of Use:  (NA) Withdrawal Symptoms: None                         ASAM's:  Six Dimensions of Multidimensional Assessment  Dimension 1:  Acute Intoxication and/or Withdrawal Potential:   Dimension 1:  Description of individual's past and current experiences of substance use and withdrawal: NA  Dimension 2:  Biomedical Conditions and Complications:   Dimension 2:  Description of patient's biomedical conditions and  complications: NA  Dimension 3:  Emotional, Behavioral, or Cognitive Conditions and Complications:  Dimension 3:  Description of emotional, behavioral, or cognitive conditions and complications: NA  Dimension 4:  Readiness to Change:  Dimension 4:  Description of Readiness to Change criteria: NA   Dimension 5:  Relapse, Continued use, or Continued Problem Potential:  Dimension 5:  Relapse, continued use, or continued problem potential critiera description: NA  Dimension 6:  Recovery/Living Environment:  Dimension 6:  Recovery/Iiving environment criteria description: NA  ASAM Severity Score:    ASAM Recommended Level of Treatment: ASAM Recommended Level of Treatment:  (NA)   Substance use Disorder (SUD) Substance Use Disorder (SUD)  Checklist Symptoms of Substance Use:  (NA)  Recommendations for Services/Supports/Treatments: Recommendations for Services/Supports/Treatments Recommendations For Services/Supports/Treatments:  (NA)  Discharge Disposition:    DSM5 Diagnoses: Patient Active Problem List   Diagnosis Date Noted   CKD (chronic kidney disease) stage 5, GFR less than 15 ml/min (HCC) 09/12/2022   AKI (acute kidney injury) (HCC) 06/21/2022   Spondylolisthesis at L3-L4 level 06/12/2022   Facet arthropathy, lumbar 06/12/2022   Pelvic floor weakness in female 06/12/2022   Lower extremity weakness 06/11/2022   Acute on chronic diastolic heart failure (HCC) 06/10/2022   Hip pain 06/10/2022   Urinary incontinence 03/08/2022   Incontinence of feces 03/08/2022   Neck pain 02/28/2022   Dyspnea 02/03/2022   Subacute osteomyelitis of lumbar spine (HCC) 09/27/2021   Bipolar affective disorder, currently depressed, mild (HCC) 09/23/2021   History of falling 07/01/2021   Anemia of renal disease 06/16/2021   Anxiety and depression 05/25/2021   Physical deconditioning 05/25/2021   Cauda equina compression (HCC) 04/24/2021   Severe Spinal stenosis, lumbar 04/24/2021   Hyperlipidemia 03/31/2021   Coronary artery disease involving native coronary artery of native heart without angina pectoris 01/16/2021   Anxiety disorder, unspecified 01/16/2021   GERD (gastroesophageal reflux disease)    Polypharmacy 11/17/2020   Schizophrenia (HCC) 02/20/2020   Hypersomnia, Chronic    Adnexal  cyst, right 01/02/2020   Genital herpes 11/25/2019   Family discord 02/04/2019   PTSD (post-traumatic stress disorder) 05/27/2018   Schizoaffective disorder, bipolar type (HCC) 04/05/2018   Frequent falls 10/11/2017   Dependence on continuous supplemental oxygen 05/14/2017   Gout 04/11/2017   Chronic kidney disease (CKD), stage IV (severe) (HCC) 12/15/2016   History of Anoxic brain injury (HCC) 09/08/2016   Overactive bladder 06/07/2016   OSA and COPD overlap syndrome (HCC)    Osteoarthritis, multiple sites    Acute renal failure superimposed on stage 4 chronic kidney disease (HCC) 11/24/2015   Tobacco use disorder 07/22/2014   Seizure (  HCC) 01/04/2013   Chronic low back pain with sciatica 06/18/2012   Morbid obesity (HCC) 10/18/2010     Referrals to Alternative Service(s): Referred to Alternative Service(s):   Place:   Date:   Time:    Referred to Alternative Service(s):   Place:   Date:   Time:    Referred to Alternative Service(s):   Place:   Date:   Time:    Referred to Alternative Service(s):   Place:   Date:   Time:     Alfredia Ferguson, LCAS

## 2022-11-10 NOTE — ED Provider Notes (Signed)
[place] EMERGENCY DEPARTMENT AT [facility]  Provider Note  CSN: [id number] Arrival date & time: 11/10/22 0030  History Chief Complaint  Patient presents with   Paranoid   Homicidal    Tiffany [name] is a 62 y.o. female with complex history of multiple medical problems including HTN, ESRD recently started HD MWF and schizophrenia on medications but no longer has a regular psychiatrist. She called mobile crisis tonight due to feeling paranoid that everyone was looking at her like something was wrong with her and getting angry with her family who was visiting her. She has been depressed as well. Apparently mobile crisis center called EMS and [facility] brought her here for evaluation. She denies SI.     Home Medications Prior to Admission medications   Medication Sig Start Date End Date Taking? Authorizing Provider  albuterol (VENTOLIN HFA) 108 (90 Base) MCG/ACT inhaler INHALE 2 PUFFS INTO THE LUNGS EVERY 6 HOURS AS NEEDED FOR WHEEZING OR SHORTNESS OF BREATH 10/24/22   Tiffany Kocher, DO  amLODipine (NORVASC) 10 MG tablet Take 1 tablet (10 mg total) by mouth daily. 10/09/22   McDiarmid, Leighton Roach, MD  atorvastatin (LIPITOR) 20 MG tablet Take 1 tablet (20 mg total) by mouth daily. 10/09/22   McDiarmid, Leighton Roach, MD  calcitRIOL (ROCALTROL) 0.5 MCG capsule Take 1 capsule (0.5 mcg total) by mouth daily. 10/09/22   McDiarmid, Leighton Roach, MD  carvedilol (COREG) 12.5 MG tablet Take 1 tablet (12.5 mg total) by mouth 2 (two) times daily. 10/09/22   McDiarmid, Leighton Roach, MD  cyclobenzaprine (FLEXERIL) 10 MG tablet Take 1 tablet (10 mg total) by mouth 3 (three) times daily. 10/09/22   McDiarmid, Leighton Roach, MD  dicyclomine (BENTYL) 10 MG capsule Take 1 capsule (10 mg total) by mouth 3 (three) times daily before meals. 10/09/22   McDiarmid, Leighton Roach, MD  DULoxetine (CYMBALTA) 30 MG capsule Take 1 capsule (30 mg total) by mouth daily. 10/09/22   McDiarmid, Leighton Roach, MD  escitalopram (LEXAPRO) 20 MG tablet Take 1 tablet  (20 mg total) by mouth daily. 10/09/22   McDiarmid, Leighton Roach, MD  ezetimibe (ZETIA) 10 MG tablet Take 1 tablet (10 mg total) by mouth daily. 10/09/22   McDiarmid, Leighton Roach, MD  ferrous sulfate 325 (65 FE) MG tablet Take 1 tablet (325 mg total) by mouth every other day. 10/09/22 10/04/23  McDiarmid, Leighton Roach, MD  Fluticasone-Umeclidin-Vilant (TRELEGY ELLIPTA) 100-62.5-25 MCG/ACT AEPB INHALE 1 PUFF INTO THE LUNGS DAILY 03/20/22   Tiffany Kocher, DO  folic acid (FOLVITE) 1 MG tablet Take 1 tablet (1 mg total) by mouth daily. 10/09/22   McDiarmid, Leighton Roach, MD  ibuprofen (ADVIL) 200 MG tablet Take 400-600 mg by mouth daily.    [provider]  lamoTRIgine (LAMICTAL) 100 MG tablet Take 1 tablet (100 mg total) by mouth daily. 10/09/22   McDiarmid, Leighton Roach, MD  loperamide (IMODIUM) 2 MG capsule Take 1 capsule (2 mg total) by mouth 2 (two) times daily. 10/09/22   McDiarmid, Leighton Roach, MD  nystatin (MYCOSTATIN) 100000 UNIT/ML suspension Take 5 mLs (500,000 Units total) by mouth 4 (four) times daily. 10/03/22   Gwyneth Sprout, MD  OLANZapine (ZYPREXA) 10 MG tablet Take 1 tablet (10 mg total) by mouth at bedtime. 10/09/22   McDiarmid, Leighton Roach, MD  omeprazole (PRILOSEC) 40 MG capsule Take 1 capsule (40 mg total) by mouth every morning. 10/09/22   McDiarmid, Leighton Roach, MD  potassium chloride SA (KLOR-CON M) 20 MEQ tablet Take  20 mEq by mouth See admin instructions. Take 1 tablet by mouth for 3 days    [provider]  potassium chloride SA (KLOR-CON M) 20 MEQ tablet Take 1 tablet (20 mEq total) by mouth 2 (two) times daily for 5 days. 10/03/22 10/08/22  Gwyneth Sprout, MD  potassium chloride SA (KLOR-CON M) 20 MEQ tablet Take 2 tablets (40 mEq total) by mouth daily for 5 days. 10/17/22     prazosin (MINIPRESS) 2 MG capsule Take 1 capsule (2 mg total) by mouth at bedtime. 10/09/22   McDiarmid, Leighton Roach, MD  SAPHRIS 10 MG SUBL Place 1 tablet (10 mg total) under the tongue daily. 10/09/22   McDiarmid, Leighton Roach, MD  torsemide  (DEMADEX) 20 MG tablet Take 1 tablet (20 mg total) by mouth 2 (two) times daily. 11/09/22        Allergies    Asa [aspirin], Hydrocodone, Latuda [lurasidone hcl], Magnesium-containing compounds, Prednisone, Ultram [tramadol], Codeine, Desyrel [trazodone], Hydralazine, Peanut (diagnostic), Topamax [topiramate], Sulfa antibiotics, and Tape   Review of Systems   Review of Systems Please see HPI for pertinent positives and negatives  Physical Exam BP (!) 168/87   Pulse 70   Temp 98.7 F (37.1 C)   Resp 20   Ht 5\' 3"  (1.6 m)   Wt 88.5 kg   SpO2 98%   BMI 34.54 kg/m   Physical Exam Vitals and nursing note reviewed.  HENT:     Head: Normocephalic.     Nose: Nose normal.  Eyes:     Extraocular Movements: Extraocular movements intact.  Pulmonary:     Effort: Pulmonary effort is normal.     Comments: Dialysis access in R upper chest Musculoskeletal:        General: Normal range of motion.     Cervical back: Neck supple.  Skin:    Findings: No rash (on exposed skin).  Neurological:     Mental Status: [name] alert and oriented to person, place, and time.  Psychiatric:        Mood and Affect: Mood normal.     ED Results / Procedures / Treatments   EKG None  Procedures Procedures  Medications Ordered in the ED Medications - No data to display  Initial Impression and Plan  Patient with complex PMH on dialysis brought to the [facility] ED by EMS for paranoia and HI. She does not have any acute medical concerns. [name] due for dialysis later today. While I was explaining the difficult and complex nature of placing dialysis patients into in-patient psychiatric care, she stopped talking or responding to me. Will check labs for acute medical concerns and plan TTS evaluation if medically stable.   ED Course   Clinical Course as of 11/10/22 0447  Fri Nov 10, 2022  0206 CMP with CKD, otherwise no concerning findings.  [CS]  0219 CBC with anemia, not in need of transfusion.  APAP, ASA and ETOH are neg. Will consult TTS.  [CS]    Clinical Course User Index [CS] Pollyann Savoy, MD     MDM Rules/Calculators/A&P Medical Decision Making Problems Addressed: ESRD on hemodialysis [facility]): chronic illness or injury Paranoia [facility]): chronic illness or injury with exacerbation, progression, or side effects of treatment  Amount and/or Complexity of Data Reviewed Labs: ordered. Decision-making details documented in ED Course.     Final Clinical Impression(s) / ED Diagnoses Final diagnoses:  Paranoia (HCC)  ESRD on hemodialysis (HCC)    Rx / DC Orders ED Discharge  Orders     None        Pollyann Savoy, MD 11/10/22 [phone]

## 2022-11-10 NOTE — Progress Notes (Addendum)
Pt was accepted to [facility] 11/10/2022 Bed Assignment 29  Address: [address] [place], [place], [place] [address]  [facility] Fax: [phone]  Pt meets inpatient criteria per Joaquin Courts NP   Attending Physician will be Dr. Shellee Milo  Report can be called to: [phone]  Pt can arrive awaiting transport   Care Team notified: [facility] AC/RN, Vickie Epley NT, Bethany Hendra LCSW, Pollyann Glen LCAS, Alvira Monday NP, and Lesleigh Noe RN.      [place] Zauria Dombek MSW, LCSW-A  11/10/2022 12:39 PM

## 2022-11-10 NOTE — Progress Notes (Addendum)
Patient is a 62 year old female admitted voluntarily to the [facility] floor from [facility] ED at approx. 1600 with thoughts of HI toward family, reports of paranoia, and complaints of depression. Patient presents to assessment via stretcher And is not ambulatory. Patient transferred to a geri chair. [name] A+O x 4. She currently denies SI/AVH. She currently endorses HI toward family. "I want them to blow up." She does agree to contract for safety on the unit. Patient's affect is appropriate and speech is logical and coherent. Patient endorses depression and anxiety 10/10. Support and encouragement given. She currently denies pain. Reports the use of an electric wheelchair at home. Eyeglasses on her person. Last [facility] yesterday November 10, 2022. Patient denies smoking cigs, drinking alcohol, or using substances.  Skin assessment and body search completed with Elexis, MHT. Skin: warm/dry. R chest dialysis port, R leg scratch marks, tattoos R/L arm, 3.5x1.5cm wound on inner fold of R buttock.   Emotional support and reassurance provided throughout admission intake. Consents signed. Afterwards, oriented patient to unit, room and call light, reviewed POC with all questions answered and concerns voiced. Patient verbalized understanding. Denies any needs at this time.  Will continue to monitor with ongoing Q 15 minute safety checks.

## 2022-11-10 NOTE — ED Triage Notes (Signed)
Pt BIB [facility] from home for paranoia, pt reports she was recently diagnosed with end stage renal failure and is now requiring dialysis (M/W/F), last had dialysis W, pt reports she called mobile crisis due to paranoia, depressed, and HI toward her family, denies SI

## 2022-11-10 NOTE — ED Notes (Signed)
Pt placed in purple scrubs, wanded by security

## 2022-11-10 NOTE — ED Notes (Signed)
Contacted [facility] for transport 

## 2022-11-11 DIAGNOSIS — F250 Schizoaffective disorder, bipolar type: Secondary | ICD-10-CM | POA: Diagnosis not present

## 2022-11-11 LAB — COMPREHENSIVE METABOLIC PANEL
ALT: 15 U/L (ref 0–44)
AST: 23 U/L (ref 15–41)
Albumin: 3.5 g/dL (ref 3.5–5.0)
Alkaline Phosphatase: 120 U/L (ref 38–126)
Anion gap: 10 (ref 5–15)
BUN: 23 mg/dL (ref 8–23)
CO2: 27 mmol/L (ref 22–32)
Calcium: 9 mg/dL (ref 8.9–10.3)
Chloride: 102 mmol/L (ref 98–111)
Creatinine, Ser: 3.68 mg/dL — ABNORMAL HIGH (ref 0.44–1.00)
GFR, Estimated: 13 mL/min — ABNORMAL LOW (ref >60)
Glucose, Bld: 97 mg/dL (ref 70–99)
Potassium: 3.5 mmol/L (ref 3.5–5.1)
Sodium: 139 mmol/L (ref 135–145)
Total Bilirubin: 0.7 mg/dL (ref 0.3–1.2)
Total Protein: 6.8 g/dL (ref 6.5–8.1)

## 2022-11-11 LAB — CBC
HCT: 29.3 % — ABNORMAL LOW (ref 36.0–46.0)
Hemoglobin: 9 g/dL — ABNORMAL LOW (ref 12.0–15.0)
MCH: 28.6 pg (ref 26.0–34.0)
MCHC: 30.7 g/dL (ref 30.0–36.0)
MCV: 93 fL (ref 80.0–100.0)
Platelets: 317 10*3/uL (ref 150–400)
RBC: 3.15 MIL/uL — ABNORMAL LOW (ref 3.87–5.11)
RDW: 15.1 % (ref 11.5–15.5)
WBC: 7.4 10*3/uL (ref 4.0–10.5)
nRBC: 0 % (ref 0.0–0.2)

## 2022-11-11 LAB — HEPATITIS B SURFACE ANTIGEN: Hepatitis B Surface Ag: NONREACTIVE

## 2022-11-11 LAB — GLUCOSE, CAPILLARY: Glucose-Capillary: 89 mg/dL (ref 70–99)

## 2022-11-11 MED ORDER — CYCLOBENZAPRINE HCL 10 MG PO TABS
10.0000 mg | ORAL_TABLET | Freq: Three times a day (TID) | ORAL | Status: DC
  Administered 2022-11-11 – 2022-11-22 (×30): 10 mg via ORAL
  Filled 2022-11-11 (×37): qty 1

## 2022-11-11 MED ORDER — CHLORHEXIDINE GLUCONATE CLOTH 2 % EX PADS: 6.0000 | MEDICATED_PAD | Freq: Every day | CUTANEOUS | Status: DC

## 2022-11-11 NOTE — Progress Notes (Signed)
Patient ordered 1:1 sitter for safety. Patient is not ambulatory, incontinent and has order for  3L continuous oxygen. Endorses depression due to declining physical health. Endorses HI towards home care staff. Denies anxiety.  Denies SI and AVH.   Compliant with scheduled medications.  15 min checks in place for safety.  Patient present in the milieu. Engaged in activities and groups.  Appropriate interaction with peers and staff.    Patient was dialized this afternoon. PT also consulted on patient.

## 2022-11-11 NOTE — Progress Notes (Signed)
Received patient in bed to unit.    Informed consent signed and in chart.    TX duration: 2.5 hrs     Transported back to floor  Hand-off given to patient's nurse.   Access used:  rt cvc chest  Access issues: n/a  Total UF removed: 1000 mls       Maple Hudson, RN Dialysis Unit

## 2022-11-11 NOTE — H&P (Addendum)
Psychiatric Admission Assessment Adult  Patient Identification: Tiffany [name] MRN:  [id number] Date of Evaluation:  11/11/2022 Chief Complaint:  Schizoaffective disorder (HCC) [F25.9] Principal Diagnosis: Schizoaffective disorder (HCC) Diagnosis:  Principal Problem:   Schizoaffective disorder (HCC)  History of Present Illness: Tiffany [name] is a 62 year old African-American female who is voluntarily admitted to inpatient psychiatry for depression and agitation.  She had a visit from her family and became very disturbed and called 911 and she was brought to [facility].with complex history of multiple medical problems including HTN, ESRD recently started HD MWF.  Other problems include congestive heart failure, diabetes, COPD, polysubstance abuse, stroke, and malingering.  She called mobile crisis tonight due to feeling paranoid that everyone was looking at her like something was wrong with her and getting angry with her family who was visiting her. She has been depressed as well. Apparently mobile crisis center called EMS and [facility] brought her here for evaluation. She denies SI.  She has a long history of behavior problems and tells me that she has never been psychiatrically admitted but she has been seen in the emergency room multiple times and been diagnosed with depression, schizoaffective disorder, etc.  She does not have a current outpatient provider for her psychiatric medications.  She denies any auditory or visual hallucinations.  She denies any suicidal or homicidal ideation.  Associated Signs/Symptoms: Depression Symptoms:  depressed mood, anxiety, (Hypo) Manic Symptoms:  Irritable Mood, Labiality of Mood, Anxiety Symptoms:  Excessive Worry, Psychotic Symptoms:  Paranoia, PTSD Symptoms: NA Total Time spent with patient: 1 hour  Past Psychiatric History: No inpatient psychiatric admissions.  She has been seen outpatient in [place], [place], and [place].  Is the patient at risk to  self? No.  Has the patient been a risk to self in the past 6 months? No.  Has the patient been a risk to self within the distant past? No.  Is the patient a risk to others? No.  Has the patient been a risk to others in the past 6 months? No.  Has the patient been a risk to others within the distant past? No.   [place] Scale:  Flowsheet Row Admission (Current) from 11/10/2022 in [facility] BEHAVIORAL MEDICINE Most recent reading at 11/10/2022  5:00 PM ED from 11/10/2022 in [facility] Emergency Department at [facility] Most recent reading at 11/10/2022 10:39 AM ED from 11/07/2022 in [facility] Emergency Department at [facility] Most recent reading at 11/07/2022 12:56 PM  C-SSRS RISK CATEGORY No Risk No Risk No Risk        Prior Inpatient Therapy: No. If yes, describe  Prior Outpatient Therapy: Yes.   If yes, describe unknown  Alcohol Screening: 1. How often do you have a drink containing alcohol?: Never 2. How many drinks containing alcohol do you have on a typical day when you are drinking?: 1 or 2 3. How often do you have six or more drinks on one occasion?: Never AUDIT-C Score: 0 4. How often during the last year have you found that you were not able to stop drinking once you had started?: Never 5. How often during the last year have you failed to do what was normally expected from you because of drinking?: Never 6. How often during the last year have you needed a first drink in the morning to get yourself going after a heavy drinking session?: Never 7. How often during the last year have you had a feeling of guilt of remorse after  drinking?: Never 8. How often during the last year have you been unable to remember what happened the night before because you had been drinking?: Never 9. Have you or someone else been injured as a result of your drinking?: No 10. Has a relative or friend or a doctor or another health worker been concerned about your drinking or  suggested you cut down?: No Alcohol Use Disorder Identification Test Final Score (AUDIT): 0 Alcohol Brief Interventions/Follow-up: Alcohol education/Brief advice Substance Abuse History in the last 12 months:  No. Consequences of Substance Abuse: NA Previous Psychotropic Medications: Yes  Psychological Evaluations: Yes  Past Medical History:  Past Medical History:  Diagnosis Date   Abdominal pain    Acute encephalopathy 11/21/2017   Acute exacerbation of CHF (congestive heart failure) (HCC) 03/03/2022   Acute GI bleeding 03/29/2021   Acute kidney injury superimposed on chronic kidney disease (HCC) 02/18/2020   Acute metabolic encephalopathy 02/20/2020   Acute on chronic diastolic heart failure (HCC) 02/04/2022   Acute on chronic renal failure (HCC) 06/02/2022   Aggressive behavior    Agitation 11/22/2017   Anoxic brain injury (HCC) 09/08/2016   C. Arrest due to respiratory failure and COPD exacerbation   Anxiety    Arthritis    "all over" (04/10/2016)   Asthma 10/18/2010   Binge eating disorder    Blood loss anemia 04/24/2011   CAP (community acquired pneumonia) 06/22/2015   Cardiac arrest (HCC) 09/08/2016   PEA   Carotid artery stenosis    1-39% bilateral by dopplers 11/2016   Chronic diastolic (congestive) heart failure (HCC)    Chronic kidney disease, stage 3 (HCC)    Chronic kidney disease, stage 3b (HCC) 06/06/2021   Chronic pain syndrome 06/18/2012   Chronic post-traumatic stress disorder (PTSD) 05/27/2018   Chronic respiratory failure with hypoxia and hypercapnia (HCC) 06/22/2015   TRILOGY Vent >AVAPA-ES., Vt target 200-400, Max P 30 , PS max 20 , PS min 6-10 , E Max 6, E Min 4, Rate Auto AVAPS Rate 2 (titrate for pt comfort) , bleed O2 at 5l/m continuous flow .    CKD (chronic kidney disease) 02/03/2022   Closed displaced fracture of fifth metacarpal bone 03/21/2018   Cocaine use disorder, severe, in sustained remission (HCC) 12/17/2015   Complication of anesthesia     decreased bp, decreased heart rate   COPD (chronic obstructive pulmonary disease) (HCC) 07/08/2014   Delusional disorder, persecutory type (HCC) 06/26/2021   Depression    Diabetic neuropathy (HCC) 04/24/2011   Difficulty with speech 01/24/2018   Disorder of nervous system    Drug abuse (HCC) 11/21/2017   Dyslipidemia 04/24/2011   E. coli UTI 02/20/2020   Elevated troponin 04/28/2012   Emphysema    Encephalopathy 11/21/2017   Essential hypertension 03/22/2016   Facet arthropathy, lumbar 06/12/2022   Fibula fracture 07/10/2016   Frequent falls 10/11/2017   GERD (gastroesophageal reflux disease)    GI bleed 03/30/2021   Gout 04/11/2017   Heart attack (HCC) 1980s   Hematochezia    History of blood transfusion 1994   "couldn't stop bleeding from my period"   History of drug abuse in remission (HCC) 11/28/2015   Quit in 2017   Hyperlipidemia 03/31/2021   Hyperlipidemia LDL goal <70    Hypocalcemia    Hypokalemia    Hypomagnesemia    Incontinence    Manic depression (HCC)    Morbid obesity (HCC) 10/18/2010   Obstructive sleep apnea 10/18/2010   On home oxygen therapy    "  6L; 24/7" (04/10/2016)   OSA on CPAP    "wear mask sometimes" (04/10/2016)   Painless rectal bleeding 12/31/2015   Paranoid (HCC)    "sometimes; I'm on RX for it" (04/10/2016)   Periumbilical abdominal pain    Prolonged Q-T interval on ECG    Rectal bleeding 12/31/2015   Recurrent syncope 07/09/2021   Rhabdomyolysis 06/16/2021   Schizoaffective disorder, bipolar type (HCC) 04/05/2018   Seasonal allergies    Seborrheic keratoses 12/31/2013   Seizures (HCC)    "don't know what kind; last one was ~ 1 yr ago" (04/10/2016)   Sinus bradycardia    Skin ulcer of sacrum, limited to breakdown of skin (HCC) 03/08/2022   Spondylolisthesis at L3-L4 level 06/12/2022   Stroke (HCC) 1980s   denies residual on 04/10/2016   Thrush 09/19/2013   Type 2 diabetes mellitus (HCC) 10/18/2010    Past Surgical History:   Procedure Laterality Date   CESAREAN SECTION  1997   COLONOSCOPY WITH PROPOFOL N/A 04/01/2021   Procedure: COLONOSCOPY WITH PROPOFOL;  Surgeon: Jeani Hawking, MD;  Location: [facility] ENDOSCOPY;  Service: Gastroenterology;  Laterality: N/A;  Rectal bleeding with drop in hemoglobin to 7.2 g/dL   HERNIA REPAIR     IR CHOLANGIOGRAM EXISTING TUBE  07/20/2016   IR PERC CHOLECYSTOSTOMY  05/10/2016   IR RADIOLOGIST EVAL & MGMT  06/08/2016   IR RADIOLOGIST EVAL & MGMT  06/29/2016   IR SINUS/FIST TUBE CHK-NON GI  07/12/2016   RIGHT/LEFT HEART CATH AND CORONARY ANGIOGRAPHY N/A 06/19/2017   Procedure: RIGHT/LEFT HEART CATH AND CORONARY ANGIOGRAPHY;  Surgeon: Dolores Patty, MD;  Location: [facility] INVASIVE CV LAB;  Service: Cardiovascular;  Laterality: N/A;   TIBIA IM NAIL INSERTION Right 07/12/2016   Procedure: INTRAMEDULLARY (IM) NAIL RIGHT TIBIA;  Surgeon: Tarry Kos, MD;  Location: [facility] OR;  Service: Orthopedics;  Laterality: Right;   UMBILICAL HERNIA REPAIR  ~ 1963   "that's why I don't have a belly button"   VAGINAL HYSTERECTOMY     Family History:  Family History  Problem Relation Age of Onset   Cancer Mother        lung   Depression Mother    Cancer Father        prostate   Depression Sister    Anxiety disorder Sister    Schizophrenia Sister    Bipolar disorder Sister    Depression Sister    Depression Brother    Heart failure Other        cousin   Family Psychiatric  History: Unremarkable Tobacco Screening:  Social History   Tobacco Use  Smoking Status Former   Current packs/day: 0.00   Average packs/day: 1.5 packs/day for 39.1 years (58.6 ttl pk-yrs)   Types: Cigarettes   Start date: 03/13/1977   Quit date: 04/10/2016   Years since quitting: 6.5   Passive exposure: Past  Smokeless Tobacco Never    [facility] Tobacco Counseling     Are you interested in Tobacco Cessation Medications?  No value filed. Counseled patient on smoking cessation:  No value filed. Reason Tobacco Screening Not  Completed: No value filed.       Social History:  Social History   Substance and Sexual Activity  Alcohol Use No   Alcohol/week: 0.0 standard drinks of alcohol     Social History   Substance and Sexual Activity  Drug Use Not Currently   Types: Cocaine   Comment: 04/10/2016 "last used cocaine back in November 2017"  Additional Social History:                           Allergies:   Allergies  Allergen Reactions   Asa [Aspirin] Other (See Comments)    GI bleeds   Hydrocodone Shortness Of Breath   Latuda [Lurasidone Hcl] Anaphylaxis   Magnesium-Containing Compounds Anaphylaxis and Other (See Comments)    Tolerated Ensure   Prednisone Anaphylaxis, Swelling and Other (See Comments)    Swelling of tongue, lips, throat   Ultram [Tramadol] Anaphylaxis, Swelling and Rash   Codeine Nausea And Vomiting and Rash   Desyrel [Trazodone] Other (See Comments)    Paranoia    Hydralazine Other (See Comments)    Muscle spasms   Peanut (Diagnostic) Hives and Other (See Comments)    Raw peanuts only  OK to eat peanut butter.   Topamax [Topiramate] Other (See Comments)    Paranoia   Sulfa Antibiotics Itching   Tape Rash   Lab Results:  Results for orders placed or performed during the hospital encounter of 11/10/22 (from the past 48 hour(s))  Glucose, capillary     Status: None   Collection Time: 11/11/22  7:50 AM  Result Value Ref Range   Glucose-Capillary 89 70 - 99 mg/dL    Comment: Glucose reference range applies only to samples taken after fasting for at least 8 hours.  CBC     Status: Abnormal   Collection Time: 11/11/22  8:25 AM  Result Value Ref Range   WBC 7.4 4.0 - 10.5 K/uL   RBC 3.15 (L) 3.87 - 5.11 MIL/uL   Hemoglobin 9.0 (L) 12.0 - 15.0 g/dL   HCT 16.0 (L) 10.9 - 32.3 %   MCV 93.0 80.0 - 100.0 fL   MCH 28.6 26.0 - 34.0 pg   MCHC 30.7 30.0 - 36.0 g/dL   RDW 55.7 32.2 - 02.5 %   Platelets 317 150 - 400 K/uL   nRBC 0.0 0.0 - 0.2 %    Comment:  Performed at [facility], [address] [place], [place] [address]  Comprehensive metabolic panel     Status: Abnormal   Collection Time: 11/11/22  8:25 AM  Result Value Ref Range   Sodium 139 135 - 145 mmol/L   Potassium 3.5 3.5 - 5.1 mmol/L   Chloride 102 98 - 111 mmol/L   CO2 27 22 - 32 mmol/L   Glucose, Bld 97 70 - 99 mg/dL    Comment: Glucose reference range applies only to samples taken after fasting for at least 8 hours.   BUN 23 8 - 23 mg/dL   Creatinine, Ser 2.37 (H) 0.44 - 1.00 mg/dL   Calcium 9.0 8.9 - 62.8 mg/dL   Total Protein 6.8 6.5 - 8.1 g/dL   Albumin 3.5 3.5 - 5.0 g/dL   AST 23 15 - 41 U/L   ALT 15 0 - 44 U/L   Alkaline Phosphatase 120 38 - 126 U/L   Total Bilirubin 0.7 0.3 - 1.2 mg/dL   GFR, Estimated 13 (L) >60 mL/min    Comment: (NOTE) Calculated using the CKD-EPI Creatinine Equation (2021)    Anion gap 10 5 - 15    Comment: Performed at [facility], [address] [place], [place] [address]   *Note: Due to a large number of results and/or encounters for the requested time period, some results have not been displayed. A complete set of results can be found in  Results Review.    Blood Alcohol level:  Lab Results  Component Value Date   ETH <10 11/10/2022   ETH <10 06/21/2022    Metabolic Disorder Labs:  Lab Results  Component Value Date   HGBA1C 5.0 01/05/2022   MPG 99.67 06/17/2021   MPG 105.41 07/23/2020   No results found for: "PROLACTIN" Lab Results  Component Value Date   CHOL 127 08/05/2018   TRIG 53 08/05/2018   HDL 62 08/05/2018   CHOLHDL 2.0 08/05/2018   VLDL 38 01/07/2017   LDLCALC 54 08/05/2018   LDLCALC 56 01/07/2017    Current Medications: Current Facility-Administered Medications  Medication Dose Route Frequency Provider Last Rate Last Admin   acetaminophen (TYLENOL) tablet 650 mg  650 mg Oral Q6H PRN Bing Neighbors, NP   650 mg at 11/11/22 0642   albuterol (VENTOLIN HFA) 108 (90 Base) MCG/ACT  inhaler 2 puff  2 puff Inhalation Q6H PRN Bing Neighbors, NP       amLODipine (NORVASC) tablet 10 mg  10 mg Oral Daily Bing Neighbors, NP   10 mg at 11/11/22 0902   asenapine (SAPHRIS) sublingual tablet 5 mg  5 mg Sublingual BID Bing Neighbors, NP   5 mg at 11/11/22 2841   atorvastatin (LIPITOR) tablet 20 mg  20 mg Oral Daily Bing Neighbors, NP   20 mg at 11/11/22 3244   calcitRIOL (ROCALTROL) capsule 0.5 mcg  0.5 mcg Oral Daily Bing Neighbors, NP   0.5 mcg at 11/11/22 0902   carvedilol (COREG) tablet 12.5 mg  12.5 mg Oral BID WC Bing Neighbors, NP   12.5 mg at 11/11/22 0102   Chlorhexidine Gluconate Cloth 2 % PADS 6 each  6 each Topical Q0600 Wendee Beavers, NP       dicyclomine (BENTYL) capsule 10 mg  10 mg Oral TID AC Bing Neighbors, NP   10 mg at 11/11/22 0902   diphenhydrAMINE (BENADRYL) capsule 50 mg  50 mg Oral TID PRN Bing Neighbors, NP   50 mg at 11/11/22 0255   Or   diphenhydrAMINE (BENADRYL) injection 50 mg  50 mg Intramuscular TID PRN Bing Neighbors, NP       DULoxetine (CYMBALTA) DR capsule 30 mg  30 mg Oral Daily Bing Neighbors, NP   30 mg at 11/11/22 0903   escitalopram (LEXAPRO) tablet 20 mg  20 mg Oral Daily Bing Neighbors, NP   20 mg at 11/11/22 7253   ferrous sulfate tablet 325 mg  325 mg Oral [place] Pink, NP   325 mg at 11/11/22 0903   fluticasone furoate-vilanterol (BREO ELLIPTA) 100-25 MCG/ACT 1 puff  1 puff Inhalation Daily Bing Neighbors, NP   1 puff at 11/11/22 0913   And   umeclidinium bromide (INCRUSE ELLIPTA) 62.5 MCG/ACT 1 puff  1 puff Inhalation Daily Bing Neighbors, NP   1 puff at 11/11/22 0913   folic acid (FOLVITE) tablet 1 mg  1 mg Oral Daily Bing Neighbors, NP   1 mg at 11/11/22 6644   haloperidol (HALDOL) tablet 5 mg  5 mg Oral TID PRN Bing Neighbors, NP   5 mg at 11/11/22 0255   Or   haloperidol lactate (HALDOL) injection 5 mg  5 mg Intramuscular TID PRN Bing Neighbors, NP        hydrOXYzine (ATARAX) tablet 25 mg  25 mg Oral TID PRN Bing Neighbors, NP  influenza vac split trivalent PF (FLULAVAL) injection 0.5 mL  0.5 mL Intramuscular Tomorrow-1000 Sarina Ill, DO       lamoTRIgine (LAMICTAL) tablet 100 mg  100 mg Oral Daily Bing Neighbors, NP   100 mg at 11/11/22 1610   LORazepam (ATIVAN) tablet 2 mg  2 mg Oral TID PRN Bing Neighbors, NP   2 mg at 11/10/22 2244   Or   LORazepam (ATIVAN) injection 2 mg  2 mg Intramuscular TID PRN Bing Neighbors, NP       OLANZapine (ZYPREXA) tablet 10 mg  10 mg Oral QHS Bing Neighbors, NP       pantoprazole (PROTONIX) EC tablet 80 mg  80 mg Oral Daily Bing Neighbors, NP   80 mg at 11/11/22 9604   prazosin (MINIPRESS) capsule 2 mg  2 mg Oral QHS Bing Neighbors, NP       torsemide [facility]) tablet 20 mg  20 mg Oral BID Bing Neighbors, NP   20 mg at 11/11/22 0902   PTA Medications: Medications Prior to Admission  Medication Sig Dispense Refill Last Dose   albuterol (VENTOLIN HFA) 108 (90 Base) MCG/ACT inhaler INHALE 2 PUFFS INTO THE LUNGS EVERY 6 HOURS AS NEEDED FOR WHEEZING OR SHORTNESS OF BREATH 8.5 g 2    amLODipine (NORVASC) 10 MG tablet Take 1 tablet (10 mg total) by mouth daily. 30 tablet 5    atorvastatin (LIPITOR) 20 MG tablet Take 1 tablet (20 mg total) by mouth daily. 30 tablet 5    calcitRIOL (ROCALTROL) 0.5 MCG capsule Take 1 capsule (0.5 mcg total) by mouth daily. 30 capsule 5    carvedilol (COREG) 12.5 MG tablet Take 1 tablet (12.5 mg total) by mouth 2 (two) times daily. 60 tablet 5    cyclobenzaprine (FLEXERIL) 10 MG tablet Take 1 tablet (10 mg total) by mouth 3 (three) times daily. 90 tablet 5    dicyclomine (BENTYL) 10 MG capsule Take 1 capsule (10 mg total) by mouth 3 (three) times daily before meals. 90 capsule 5    DULoxetine (CYMBALTA) 30 MG capsule Take 1 capsule (30 mg total) by mouth daily. 30 capsule 5    escitalopram (LEXAPRO) 20 MG tablet Take 1 tablet (20 mg total)  by mouth daily. 30 tablet 5    ezetimibe (ZETIA) 10 MG tablet Take 1 tablet (10 mg total) by mouth daily. 30 tablet 5    ferrous sulfate 325 (65 FE) MG tablet Take 1 tablet (325 mg total) by mouth every other day. 30 tablet 5    Fluticasone-Umeclidin-Vilant (TRELEGY ELLIPTA) 100-62.5-25 MCG/ACT AEPB INHALE 1 PUFF INTO THE LUNGS DAILY 60 each 0    folic acid (FOLVITE) 1 MG tablet Take 1 tablet (1 mg total) by mouth daily. 30 tablet 5    ibuprofen (ADVIL) 200 MG tablet Take 400-600 mg by mouth daily.      lamoTRIgine (LAMICTAL) 100 MG tablet Take 1 tablet (100 mg total) by mouth daily. 30 tablet 5    loperamide (IMODIUM) 2 MG capsule Take 1 capsule (2 mg total) by mouth 2 (two) times daily. 30 capsule 5    nystatin (MYCOSTATIN) 100000 UNIT/ML suspension Take 5 mLs (500,000 Units total) by mouth 4 (four) times daily. 60 mL 0    OLANZapine (ZYPREXA) 10 MG tablet Take 1 tablet (10 mg total) by mouth at bedtime. 30 tablet 5    omeprazole (PRILOSEC) 40 MG capsule Take 1 capsule (40 mg total) by mouth  every morning. 30 capsule 5    potassium chloride SA (KLOR-CON M) 20 MEQ tablet Take 20 mEq by mouth See admin instructions. Take 1 tablet by mouth for 3 days      potassium chloride SA (KLOR-CON M) 20 MEQ tablet Take 1 tablet (20 mEq total) by mouth 2 (two) times daily for 5 days. 10 tablet 0    potassium chloride SA (KLOR-CON M) 20 MEQ tablet Take 2 tablets (40 mEq total) by mouth daily for 5 days. 10 tablet 0    prazosin (MINIPRESS) 2 MG capsule Take 1 capsule (2 mg total) by mouth at bedtime. 30 capsule 5    SAPHRIS 10 MG SUBL Place 1 tablet (10 mg total) under the tongue daily. 30 tablet 5    torsemide (DEMADEX) 20 MG tablet Take 1 tablet (20 mg total) by mouth 2 (two) times daily. 60 tablet 2     Musculoskeletal: Strength & Muscle Tone: within normal limits Gait & Station: unable to stand Patient leans: N/A            Psychiatric Specialty Exam:  Presentation  General Appearance: No  data recorded Eye Contact:No data recorded Speech:No data recorded Speech Volume:No data recorded Handedness:No data recorded  Mood and Affect  Mood:No data recorded Affect:No data recorded  Thought Process  Thought Processes:No data recorded Duration of Psychotic Symptoms:N/A Past Diagnosis of Schizophrenia or Psychoactive disorder: Yes  Descriptions of Associations:No data recorded Orientation:No data recorded Thought Content:No data recorded Hallucinations:No data recorded Ideas of Reference:No data recorded Suicidal Thoughts:No data recorded Homicidal Thoughts:No data recorded  Sensorium  Memory:No data recorded Judgment:No data recorded Insight:No data recorded  Executive Functions  Concentration:No data recorded Attention Span:No data recorded Recall:No data recorded Fund of Knowledge:No data recorded Language:No data recorded  Psychomotor Activity  Psychomotor Activity:No data recorded  Assets  Assets:No data recorded  Sleep  Sleep:No data recorded   Physical Exam: Physical Exam Constitutional:      Appearance: Normal appearance.  HENT:     Head: Normocephalic and atraumatic.     Mouth/Throat:     Pharynx: Oropharynx is clear.  Eyes:     Pupils: Pupils are equal, round, and reactive to light.  Cardiovascular:     Rate and Rhythm: Normal rate and regular rhythm.  Pulmonary:     Effort: Pulmonary effort is normal.     Breath sounds: Normal breath sounds.  Abdominal:     General: Abdomen is flat.     Palpations: Abdomen is soft.  Musculoskeletal:        General: Normal range of motion.  Skin:    General: Skin is warm and dry.  Neurological:     General: No focal deficit present.     Mental Status: [name] alert. Mental status is at baseline.  Psychiatric:        Attention and Perception: Attention and perception normal.        Mood and Affect: Mood is anxious. Affect is labile and flat.        Speech: Speech normal.        Behavior:  Behavior is agitated. Behavior is cooperative.        Thought Content: Thought content is paranoid.        Cognition and Memory: Cognition and memory normal.        Judgment: Judgment is impulsive and inappropriate.    Review of Systems  Constitutional: Negative.   HENT: Negative.    Eyes: Negative.   Respiratory:  Negative.    Cardiovascular: Negative.   Gastrointestinal: Negative.   Genitourinary: Negative.   Musculoskeletal: Negative.   Skin: Negative.   Neurological: Negative.   Endo/Heme/Allergies: Negative.   Psychiatric/Behavioral:  Positive for depression. The patient is nervous/anxious and has insomnia.    Blood pressure (!) 145/75, pulse 77, temperature 98.1 F (36.7 C), resp. rate 16, height 5\' 3"  (1.6 m), weight 88.5 kg, SpO2 100%. Body mass index is 34.54 kg/m.  Treatment Plan Summary: Daily contact with patient to assess and evaluate symptoms and progress in treatment, Medication management, and Plan see orders  Observation Level/Precautions:  15 minute checks  Laboratory:  CBC Chemistry Profile  Psychotherapy:    Medications:    Consultations:    Discharge Concerns:    Estimated LOS:  Other:     Physician Treatment Plan for Primary Diagnosis: Schizoaffective disorder (HCC) Long Term Goal(s): Improvement in symptoms so as ready for discharge  Short Term Goals: Ability to identify changes in lifestyle to reduce recurrence of condition will improve, Ability to verbalize feelings will improve, Ability to disclose and discuss suicidal ideas, Ability to demonstrate self-control will improve, Ability to identify and develop effective coping behaviors will improve, Ability to maintain clinical measurements within normal limits will improve, Compliance with prescribed medications will improve, and Ability to identify triggers associated with substance abuse/mental health issues will improve  Physician Treatment Plan for Secondary Diagnosis: Principal Problem:    Schizoaffective disorder (HCC)   I certify that inpatient services furnished can reasonably be expected to improve the patient's condition.    Sarina Ill, DO 10/26/202411:03 AM

## 2022-11-11 NOTE — Progress Notes (Signed)
   11/11/22 0615  15 Minute Checks  Location Bedroom  Visual Appearance Calm  Behavior Sleeping  Sleep (Behavioral Health Patients Only)  Calculate sleep? (Click Yes once per 24 hr at 0600 safety check) Yes

## 2022-11-11 NOTE — BHH Counselor (Signed)
LCSWA attempted to meet with Tiffany [name] for completion of the assessment. Tiffany [name] was gone to dialysis and nurse stated that she would be gone for a few hours. LCSWA will attempt assessment at a later date.    Patric Dykes, MSW, LCSWA 11/11/22 1:50 pm

## 2022-11-11 NOTE — Plan of Care (Signed)
?  Problem: Education: ?Goal: Knowledge of [facility] Education information/materials will improve ?Outcome: Progressing ?Goal: Emotional status will improve ?Outcome: Progressing ?Goal: Mental status will improve ?Outcome: Progressing ?Goal: Verbalization of understanding the information provided will improve ?Outcome: Progressing ?  ?Problem: Activity: ?Goal: Interest or engagement in activities will improve ?Outcome: Progressing ?Goal: Sleeping patterns will improve ?Outcome: Progressing ?  ?Problem: Coping: ?Goal: Ability to verbalize frustrations and anger appropriately will improve ?Outcome: Progressing ?Goal: Ability to demonstrate self-control will improve ?Outcome: Progressing ?  ?Problem: Health Behavior/Discharge Planning: ?Goal: Identification of resources available to assist in meeting health care needs will improve ?Outcome: Progressing ?Goal: Compliance with treatment plan for underlying cause of condition will improve ?Outcome: Progressing ?  ?Problem: Physical Regulation: ?Goal: Ability to maintain clinical measurements within normal limits will improve ?Outcome: Progressing ?  ?Problem: Safety: ?Goal: Periods of time without injury will increase ?Outcome: Progressing ?  ?Problem: Education: ?Goal: Ability to state activities that reduce stress will improve ?Outcome: Progressing ?  ?

## 2022-11-11 NOTE — Evaluation (Addendum)
Physical Therapy Evaluation Patient Details Name: KRYSTALL HONER MRN: [id number] DOB: [birth year] Today's Date: 11/11/2022  History of Present Illness  Gayna is a 62 year old African-American female who is voluntarily admitted to inpatient psychiatry for depression and agitation.  She had a visit from her family and became very disturbed and called 911 and she was brought to [facility].with complex history of multiple medical problems including HTN, ESRD recently started HD MWF.  Other problems include congestive heart failure, diabetes, COPD, polysubstance abuse, stroke, and malingering.  Clinical Impression  Pt presents in w.c with aid present and alert and oriented and agreeable to PT. She demonstrates deficits that include decrease mobility requiring CGA and min VC and TC to perform stand pivot transfer. [name] close to functional baseline, but she still presents at falls risk in current functional state. She will benefit from further skilled PT to address these aforementioned deficits to return living along safely at home without increased risk of falling.         If plan is discharge home, recommend the following: A little help with walking and/or transfers;Direct supervision/assist for medications management;Supervision due to cognitive status;Assist for transportation;Help with stairs or ramp for entrance;A little help with bathing/dressing/bathroom   Can travel by private vehicle        Equipment Recommendations Rolling walker (2 wheels)  Recommendations for Other Services  OT consult    Functional Status Assessment Patient has had a recent decline in their functional status and demonstrates the ability to make significant improvements in function in a reasonable and predictable amount of time.     Precautions / Restrictions        Mobility  Bed Mobility                    Transfers Overall transfer level: Needs assistance Equipment used: Rolling walker (2  wheels) Transfers: Bed to chair/wheelchair/BSC   Stand pivot transfers: Contact guard assist         General transfer comment: From bed<>w.c with stand pivot    Ambulation/Gait                  Stairs            Wheelchair Mobility     Tilt Bed    Modified Rankin (Stroke Patients Only)       Balance Overall balance assessment: Modified Independent                                           Pertinent Vitals/Pain Pain Assessment Pain Assessment: No/denies pain    Home Living Family/patient expects to be discharged to:: Private residence Living Arrangements: Alone   Type of Home: House Home Access: Stairs to enter Entrance Stairs-Rails: Right Entrance Stairs-Number of Steps: 13   Home Layout: One level Home Equipment: None      Prior Function Prior Level of Function : Independent/Modified Independent             Mobility Comments: Mod I from bed<> [facility]       Extremity/Trunk Assessment   Upper Extremity Assessment Upper Extremity Assessment: Defer to OT evaluation    Lower Extremity Assessment Lower Extremity Assessment: Overall WFL for tasks assessed    Cervical / Trunk Assessment Cervical / Trunk Assessment: Normal  Communication   Communication Communication: No apparent difficulties Cueing Techniques: Verbal cues  Cognition Arousal:  Alert Behavior During Therapy: WFL for tasks assessed/performed Overall Cognitive Status: History of cognitive impairments - at baseline                                          General Comments      Exercises     Assessment/Plan    PT Assessment Patient needs continued PT services  PT Problem List Decreased strength;Decreased mobility       PT Treatment Interventions DME instruction    PT Goals (Current goals can be found in the Care Plan section)  Acute Rehab PT Goals Patient Stated Goal: Pt wants to return home and not lose strength at home.  [name] afraid that without physical therapy that she will continue to be weak. PT Goal Formulation: With patient Time For Goal Achievement: 11/25/22 Potential to Achieve Goals: Good    Frequency Min 1X/week     Co-evaluation               AM-PAC PT "6 Clicks" Mobility  Outcome Measure Help needed turning from your back to your side while in a flat bed without using bedrails?: None Help needed moving from lying on your back to sitting on the side of a flat bed without using bedrails?: None Help needed moving to and from a bed to a chair (including a wheelchair)?: A Little Help needed standing up from a chair using your arms (e.g., wheelchair or bedside chair)?: A Little Help needed to walk in hospital room?: A Lot Help needed climbing 3-5 steps with a railing? : A Lot 6 Click Score: 18    End of Session Equipment Utilized During Treatment: Gait belt Activity Tolerance: Patient tolerated treatment well Patient left: in bed Nurse Communication: Mobility status PT Visit Diagnosis: Difficulty in walking, not elsewhere classified (R26.2)    Time: 1710-1734 PT Time Calculation (min) (ACUTE ONLY): 24 min   Charges:   PT Evaluation $PT Eval Low Complexity: 1 Low PT Treatments $Therapeutic Exercise: 23-37 mins PT General Charges $$ ACUTE PT VISIT: 1 Visit        Ellin Goodie PT, DPT  [facility] [address] [place], [address] Phone: [phone]   Fax:  [phone]

## 2022-11-11 NOTE — Progress Notes (Signed)
0255-Pt. Received PRN Haldol 5 mg PO and PRN Benadryl 50 mg PO for increased agitation and restlessness. Routine observations to continue patient safety and this writer will reassess patient for medication effectiveness.

## 2022-11-11 NOTE — Progress Notes (Signed)
   11/11/22 0100  Psych Admission Type (Psych Patients Only)  Admission Status Voluntary  Psychosocial Assessment  Patient Complaints Agitation;Apathy  Eye Contact Fair  Facial Expression Anxious;Animated  Affect Anxious  Speech Logical/coherent  Appearance/Hygiene In scrubs  Behavior Characteristics Cooperative;Agitated  Mood Anxious  Thought Process  Coherency WDL  Content WDL  Delusions None reported or observed  Perception WDL  Hallucination None reported or observed  Judgment Impaired  Confusion None  Danger to Self  Current suicidal ideation? Denies  Danger to Others  Danger to Others None reported or observed   Pt. Received PRN Ativan 2 mg PO for increased agitation after reporting "bad dreams and I woke up mad." Pt. Later reports medication effective. Patient refused PM dose of Zyprexa and Minipress stating the medication was not effective. Pt. Is incontinent of bowel and urine, required an entire linen and scrub change. Pt. Requires assistance to ambulate. No acute distress noted outside of the patient's complaint of increased agitation and anxiety. Pt. Denies SI/HI/AVH and pain during shift assessment. Routine observations to continue to monitor pt. Safety.

## 2022-11-11 NOTE — Consult Note (Signed)
[facility]  CONSULT NOTE    Date: 11/11/2022                  Patient Name:  Tiffany [name]  MRN: [id number]  DOB: Dec 25, 1960  Age / Sex: 62 y.o., female         PCP: [name], MD                 Service Requesting Consult: Psychiatry                 Reason for Consult: End-stage renal disease requiring hemodialysis            History of Present Illness: Tiffany [name] is a 62 y.o.  female with  an extensive past medical history including schizophrenia, hypertension, CHF, diabetes, stroke, and end-stage renal disease on hemodialysis, who was admitted to [facility] on 11/10/2022 for Schizoaffective disorder [facility]) [F25.9]  Patient presents to [facility] for Geri psych hold after contacting mobile crisis center.Chart review states she reported depression and paranoia.  Denies SI.  Medications: Outpatient medications: Medications Prior to Admission  Medication Sig Dispense Refill Last Dose   albuterol (VENTOLIN HFA) 108 (90 Base) MCG/ACT inhaler INHALE 2 PUFFS INTO THE LUNGS EVERY 6 HOURS AS NEEDED FOR WHEEZING OR SHORTNESS OF BREATH 8.5 g 2    amLODipine (NORVASC) 10 MG tablet Take 1 tablet (10 mg total) by mouth daily. 30 tablet 5    atorvastatin (LIPITOR) 20 MG tablet Take 1 tablet (20 mg total) by mouth daily. 30 tablet 5    calcitRIOL (ROCALTROL) 0.5 MCG capsule Take 1 capsule (0.5 mcg total) by mouth daily. 30 capsule 5    carvedilol (COREG) 12.5 MG tablet Take 1 tablet (12.5 mg total) by mouth 2 (two) times daily. 60 tablet 5    cyclobenzaprine (FLEXERIL) 10 MG tablet Take 1 tablet (10 mg total) by mouth 3 (three) times daily. 90 tablet 5    dicyclomine (BENTYL) 10 MG capsule Take 1 capsule (10 mg total) by mouth 3 (three) times daily before meals. 90 capsule 5    DULoxetine (CYMBALTA) 30 MG capsule Take 1 capsule (30 mg total) by mouth daily. 30 capsule 5    escitalopram (LEXAPRO) 20 MG tablet Take 1 tablet (20 mg total) by mouth daily. 30 tablet 5     ezetimibe (ZETIA) 10 MG tablet Take 1 tablet (10 mg total) by mouth daily. 30 tablet 5    ferrous sulfate 325 (65 FE) MG tablet Take 1 tablet (325 mg total) by mouth every other day. 30 tablet 5    Fluticasone-Umeclidin-Vilant (TRELEGY ELLIPTA) 100-62.5-25 MCG/ACT AEPB INHALE 1 PUFF INTO THE LUNGS DAILY 60 each 0    folic acid (FOLVITE) 1 MG tablet Take 1 tablet (1 mg total) by mouth daily. 30 tablet 5    ibuprofen (ADVIL) 200 MG tablet Take 400-600 mg by mouth daily.      lamoTRIgine (LAMICTAL) 100 MG tablet Take 1 tablet (100 mg total) by mouth daily. 30 tablet 5    loperamide (IMODIUM) 2 MG capsule Take 1 capsule (2 mg total) by mouth 2 (two) times daily. 30 capsule 5    nystatin (MYCOSTATIN) 100000 UNIT/ML suspension Take 5 mLs (500,000 Units total) by mouth 4 (four) times daily. 60 mL 0    OLANZapine (ZYPREXA) 10 MG tablet Take 1 tablet (10 mg total) by mouth at bedtime. 30 tablet 5    omeprazole (PRILOSEC) 40 MG capsule Take 1 capsule (40  mg total) by mouth every morning. 30 capsule 5    potassium chloride SA (KLOR-CON M) 20 MEQ tablet Take 20 mEq by mouth See admin instructions. Take 1 tablet by mouth for 3 days      potassium chloride SA (KLOR-CON M) 20 MEQ tablet Take 1 tablet (20 mEq total) by mouth 2 (two) times daily for 5 days. 10 tablet 0    potassium chloride SA (KLOR-CON M) 20 MEQ tablet Take 2 tablets (40 mEq total) by mouth daily for 5 days. 10 tablet 0    prazosin (MINIPRESS) 2 MG capsule Take 1 capsule (2 mg total) by mouth at bedtime. 30 capsule 5    SAPHRIS 10 MG SUBL Place 1 tablet (10 mg total) under the tongue daily. 30 tablet 5    torsemide (DEMADEX) 20 MG tablet Take 1 tablet (20 mg total) by mouth 2 (two) times daily. 60 tablet 2     Current medications: Current Facility-Administered Medications  Medication Dose Route Frequency Provider Last Rate Last Admin   acetaminophen (TYLENOL) tablet 650 mg  650 mg Oral Q6H PRN Bing Neighbors, NP   650 mg at 11/11/22 0642    albuterol (VENTOLIN HFA) 108 (90 Base) MCG/ACT inhaler 2 puff  2 puff Inhalation Q6H PRN Bing Neighbors, NP       amLODipine (NORVASC) tablet 10 mg  10 mg Oral Daily Bing Neighbors, NP   10 mg at 11/11/22 0902   asenapine (SAPHRIS) sublingual tablet 5 mg  5 mg Sublingual BID Bing Neighbors, NP   5 mg at 11/11/22 4403   atorvastatin (LIPITOR) tablet 20 mg  20 mg Oral Daily Bing Neighbors, NP   20 mg at 11/11/22 4742   calcitRIOL (ROCALTROL) capsule 0.5 mcg  0.5 mcg Oral Daily Bing Neighbors, NP   0.5 mcg at 11/11/22 0902   carvedilol (COREG) tablet 12.5 mg  12.5 mg Oral BID WC Bing Neighbors, NP   12.5 mg at 11/11/22 5956   Chlorhexidine Gluconate Cloth 2 % PADS 6 each  6 each Topical Q0600 Wendee Beavers, NP       cyclobenzaprine (FLEXERIL) tablet 10 mg  10 mg Oral TID Sarina Ill, DO       dicyclomine (BENTYL) capsule 10 mg  10 mg Oral TID AC Bing Neighbors, NP   10 mg at 11/11/22 0902   diphenhydrAMINE (BENADRYL) capsule 50 mg  50 mg Oral TID PRN Bing Neighbors, NP   50 mg at 11/11/22 0255   Or   diphenhydrAMINE (BENADRYL) injection 50 mg  50 mg Intramuscular TID PRN Bing Neighbors, NP       DULoxetine (CYMBALTA) DR capsule 30 mg  30 mg Oral Daily Bing Neighbors, NP   30 mg at 11/11/22 0903   escitalopram (LEXAPRO) tablet 20 mg  20 mg Oral Daily Bing Neighbors, NP   20 mg at 11/11/22 3875   ferrous sulfate tablet 325 mg  325 mg Oral Maryland Pink, NP   325 mg at 11/11/22 0903   fluticasone furoate-vilanterol (BREO ELLIPTA) 100-25 MCG/ACT 1 puff  1 puff Inhalation Daily Bing Neighbors, NP   1 puff at 11/11/22 0913   And   umeclidinium bromide (INCRUSE ELLIPTA) 62.5 MCG/ACT 1 puff  1 puff Inhalation Daily Bing Neighbors, NP   1 puff at 11/11/22 0913   folic acid (FOLVITE) tablet 1 mg  1 mg Oral Daily Bing Neighbors, NP  1 mg at 11/11/22 1610   haloperidol (HALDOL) tablet 5 mg  5 mg Oral TID PRN Bing Neighbors,  NP   5 mg at 11/11/22 0255   Or   haloperidol lactate (HALDOL) injection 5 mg  5 mg Intramuscular TID PRN Bing Neighbors, NP       hydrOXYzine (ATARAX) tablet 25 mg  25 mg Oral TID PRN Bing Neighbors, NP       influenza vac split trivalent PF (FLULAVAL) injection 0.5 mL  0.5 mL Intramuscular Tomorrow-1000 Sarina Ill, DO       lamoTRIgine (LAMICTAL) tablet 100 mg  100 mg Oral Daily Bing Neighbors, NP   100 mg at 11/11/22 9604   LORazepam (ATIVAN) tablet 2 mg  2 mg Oral TID PRN Bing Neighbors, NP   2 mg at 11/10/22 2244   Or   LORazepam (ATIVAN) injection 2 mg  2 mg Intramuscular TID PRN Bing Neighbors, NP       OLANZapine (ZYPREXA) tablet 10 mg  10 mg Oral QHS Bing Neighbors, NP       pantoprazole (PROTONIX) EC tablet 80 mg  80 mg Oral Daily Bing Neighbors, NP   80 mg at 11/11/22 5409   prazosin (MINIPRESS) capsule 2 mg  2 mg Oral QHS Bing Neighbors, NP       torsemide [facility]) tablet 20 mg  20 mg Oral BID Bing Neighbors, NP   20 mg at 11/11/22 8119      Allergies: Allergies  Allergen Reactions   Asa [Aspirin] Other (See Comments)    GI bleeds   Hydrocodone Shortness Of Breath   Latuda [Lurasidone Hcl] Anaphylaxis   Magnesium-Containing Compounds Anaphylaxis and Other (See Comments)    Tolerated Ensure   Prednisone Anaphylaxis, Swelling and Other (See Comments)    Swelling of tongue, lips, throat   Ultram [Tramadol] Anaphylaxis, Swelling and Rash   Codeine Nausea And Vomiting and Rash   Desyrel [Trazodone] Other (See Comments)    Paranoia    Hydralazine Other (See Comments)    Muscle spasms   Peanut (Diagnostic) Hives and Other (See Comments)    Raw peanuts only  OK to eat peanut butter.   Topamax [Topiramate] Other (See Comments)    Paranoia   Sulfa Antibiotics Itching   Tape Rash      Past Medical History: Past Medical History:  Diagnosis Date   Abdominal pain    Acute encephalopathy 11/21/2017   Acute exacerbation of  CHF (congestive heart failure) (HCC) 03/03/2022   Acute GI bleeding 03/29/2021   Acute kidney injury superimposed on chronic kidney disease (HCC) 02/18/2020   Acute metabolic encephalopathy 02/20/2020   Acute on chronic diastolic heart failure (HCC) 02/04/2022   Acute on chronic renal failure (HCC) 06/02/2022   Aggressive behavior    Agitation 11/22/2017   Anoxic brain injury (HCC) 09/08/2016   C. Arrest due to respiratory failure and COPD exacerbation   Anxiety    Arthritis    "all over" (04/10/2016)   Asthma 10/18/2010   Binge eating disorder    Blood loss anemia 04/24/2011   CAP (community acquired pneumonia) 06/22/2015   Cardiac arrest (HCC) 09/08/2016   PEA   Carotid artery stenosis    1-39% bilateral by dopplers 11/2016   Chronic diastolic (congestive) heart failure (HCC)    Chronic kidney disease, stage 3 (HCC)    Chronic kidney disease, stage 3b (HCC) 06/06/2021   Chronic pain syndrome 06/18/2012  Chronic post-traumatic stress disorder (PTSD) 05/27/2018   Chronic respiratory failure with hypoxia and hypercapnia (HCC) 06/22/2015   TRILOGY Vent >AVAPA-ES., Vt target 200-400, Max P 30 , PS max 20 , PS min 6-10 , E Max 6, E Min 4, Rate Auto AVAPS Rate 2 (titrate for pt comfort) , bleed O2 at 5l/m continuous flow .    CKD (chronic kidney disease) 02/03/2022   Closed displaced fracture of fifth metacarpal bone 03/21/2018   Cocaine use disorder, severe, in sustained remission (HCC) 12/17/2015   Complication of anesthesia    decreased bp, decreased heart rate   COPD (chronic obstructive pulmonary disease) (HCC) 07/08/2014   Delusional disorder, persecutory type (HCC) 06/26/2021   Depression    Diabetic neuropathy (HCC) 04/24/2011   Difficulty with speech 01/24/2018   Disorder of nervous system    Drug abuse (HCC) 11/21/2017   Dyslipidemia 04/24/2011   E. coli UTI 02/20/2020   Elevated troponin 04/28/2012   Emphysema    Encephalopathy 11/21/2017   Essential hypertension  03/22/2016   Facet arthropathy, lumbar 06/12/2022   Fibula fracture 07/10/2016   Frequent falls 10/11/2017   GERD (gastroesophageal reflux disease)    GI bleed 03/30/2021   Gout 04/11/2017   Heart attack (HCC) 1980s   Hematochezia    History of blood transfusion 1994   "couldn't stop bleeding from my period"   History of drug abuse in remission (HCC) 11/28/2015   Quit in 2017   Hyperlipidemia 03/31/2021   Hyperlipidemia LDL goal <70    Hypocalcemia    Hypokalemia    Hypomagnesemia    Incontinence    Manic depression (HCC)    Morbid obesity (HCC) 10/18/2010   Obstructive sleep apnea 10/18/2010   On home oxygen therapy    "6L; 24/7" (04/10/2016)   OSA on CPAP    "wear mask sometimes" (04/10/2016)   Painless rectal bleeding 12/31/2015   Paranoid (HCC)    "sometimes; I'm on RX for it" (04/10/2016)   Periumbilical abdominal pain    Prolonged Q-T interval on ECG    Rectal bleeding 12/31/2015   Recurrent syncope 07/09/2021   Rhabdomyolysis 06/16/2021   Schizoaffective disorder, bipolar type (HCC) 04/05/2018   Seasonal allergies    Seborrheic keratoses 12/31/2013   Seizures (HCC)    "don't know what kind; last one was ~ 1 yr ago" (04/10/2016)   Sinus bradycardia    Skin ulcer of sacrum, limited to breakdown of skin (HCC) 03/08/2022   Spondylolisthesis at L3-L4 level 06/12/2022   Stroke (HCC) 1980s   denies residual on 04/10/2016   Thrush 09/19/2013   Type 2 diabetes mellitus (HCC) 10/18/2010     Past Surgical History: Past Surgical History:  Procedure Laterality Date   CESAREAN SECTION  1997   COLONOSCOPY WITH PROPOFOL N/A 04/01/2021   Procedure: COLONOSCOPY WITH PROPOFOL;  Surgeon: Jeani Hawking, MD;  Location: [facility] ENDOSCOPY;  Service: Gastroenterology;  Laterality: N/A;  Rectal bleeding with drop in hemoglobin to 7.2 g/dL   HERNIA REPAIR     IR CHOLANGIOGRAM EXISTING TUBE  07/20/2016   IR PERC CHOLECYSTOSTOMY  05/10/2016   IR RADIOLOGIST EVAL & MGMT  06/08/2016   IR  RADIOLOGIST EVAL & MGMT  06/29/2016   IR SINUS/FIST TUBE CHK-NON GI  07/12/2016   RIGHT/LEFT HEART CATH AND CORONARY ANGIOGRAPHY N/A 06/19/2017   Procedure: RIGHT/LEFT HEART CATH AND CORONARY ANGIOGRAPHY;  Surgeon: Dolores Patty, MD;  Location: [facility] INVASIVE CV LAB;  Service: Cardiovascular;  Laterality: N/A;   TIBIA IM NAIL  INSERTION Right 07/12/2016   Procedure: INTRAMEDULLARY (IM) NAIL RIGHT TIBIA;  Surgeon: Tarry Kos, MD;  Location: [facility] OR;  Service: Orthopedics;  Laterality: Right;   UMBILICAL HERNIA REPAIR  ~ 1963   "that's why I don't have a belly button"   VAGINAL HYSTERECTOMY       Family History: Family History  Problem Relation Age of Onset   Cancer Mother        lung   Depression Mother    Cancer Father        prostate   Depression Sister    Anxiety disorder Sister    Schizophrenia Sister    Bipolar disorder Sister    Depression Sister    Depression Brother    Heart failure Other        cousin     Social History: Social History   Socioeconomic History   Marital status: Widowed    Spouse name: Not on file   Number of children: 3   Years of education: 12th   Highest education level: 12th grade  Occupational History   Occupation: disabled    Comment: factory production  Tobacco Use   Smoking status: Former    Current packs/day: 0.00    Average packs/day: 1.5 packs/day for 39.1 years (58.6 ttl pk-yrs)    Types: Cigarettes    Start date: 03/13/1977    Quit date: 04/10/2016    Years since quitting: 6.5    Passive exposure: Past   Smokeless tobacco: Never  Vaping Use   Vaping status: Never Used  Substance and Sexual Activity   Alcohol use: No    Alcohol/week: 0.0 standard drinks of alcohol   Drug use: Not Currently    Types: Cocaine    Comment: 04/10/2016 "last used cocaine back in November 2017"   Sexual activity: Not Currently    Birth control/protection: Surgical  Other Topics Concern   Not on file  Social History Narrative   Has 1 son, [name]    Lives with son and his boyfriend   Her house has ramps and handrails should she ever needs them.    Her mother lives down the street from her and is a good support person in addition to her son.   She drives herself, has private transportation.    Cocaine free since 02/24/16, smoke free since 04/10/16   Social Determinants of Health   Financial Resource Strain: Low Risk  (07/11/2022)   Overall Financial Resource Strain (CARDIA)    Difficulty of Paying Living Expenses: Not very hard  Food Insecurity: Food Insecurity Present (11/10/2022)   Hunger Vital Sign    Worried About Running Out of Food in the Last Year: Sometimes true    Ran Out of Food in the Last Year: Sometimes true  Transportation Needs: No Transportation Needs (11/10/2022)   PRAPARE - Administrator, Civil Service (Medical): No    Lack of Transportation (Non-Medical): No  Physical Activity: Inactive (07/11/2022)   Exercise Vital Sign    Days of Exercise per Week: 0 days    Minutes of Exercise per Session: 0 min  Stress: Stress Concern Present (07/11/2022)   [employer] of Occupational Health - Occupational Stress Questionnaire    Feeling of Stress : To some extent  Social Connections: Socially Isolated (07/11/2022)   Social Connection and Isolation Panel [NHANES]    Frequency of Communication with Friends and Family: Three times a week    Frequency of Social Gatherings with Friends and Family:  Three times a week    Attends Religious Services: Never    Active Member of Clubs or Organizations: No    Attends Banker Meetings: Never    Marital Status: Widowed  Intimate Partner Violence: Not At Risk (11/10/2022)   Humiliation, Afraid, Rape, and Kick questionnaire    Fear of Current or Ex-Partner: No    Emotionally Abused: No    Physically Abused: No    Sexually Abused: No     Review of Systems: Review of Systems  Constitutional:  Negative for chills, fever and malaise/fatigue.  HENT:   Negative for congestion, sore throat and tinnitus.   Eyes:  Negative for blurred vision and redness.  Respiratory:  Negative for cough, shortness of breath and wheezing.   Cardiovascular:  Negative for chest pain, palpitations, claudication and leg swelling.  Gastrointestinal:  Negative for abdominal pain, blood in stool, diarrhea, nausea and vomiting.  Genitourinary:  Negative for flank pain, frequency and hematuria.  Musculoskeletal:  Negative for back pain, falls and myalgias.  Skin:  Negative for rash.  Neurological:  Negative for dizziness, weakness and headaches.  Endo/Heme/Allergies:  Does not bruise/bleed easily.  Psychiatric/Behavioral:  Positive for depression. The patient is not nervous/anxious and does not have insomnia.     Vital Signs: Blood pressure 134/87, pulse 72, temperature 98.1 F (36.7 C), temperature source Oral, resp. rate 20, height 5\' 3"  (1.6 m), weight 88.5 kg, SpO2 100%.  Weight trends: Filed Weights   11/10/22 1512  Weight: 88.5 kg    Physical Exam: General: NAD  Head: Normocephalic, atraumatic. Moist oral mucosal membranes  Eyes: Anicteric  Neck: Supple, trachea midline  Lungs:  Clear to auscultation, [place] O2  Heart: Regular rate and rhythm  Abdomen:  Soft, nontender  Extremities:  Trace-1+ peripheral edema.  Neurologic: Alert, moving all four extremities  Skin: No lesions  Access: Rt chest Permcath     Lab results: Basic Metabolic Panel: Recent Labs  Lab 11/07/22 1318 11/10/22 0135 11/11/22 0825  NA 135 136 139  K 3.5 3.4* 3.5  CL 98 98 102  CO2 24 28 27   GLUCOSE 80 88 97  BUN 15 17 23   CREATININE 3.09* 3.83* 3.68*  CALCIUM 9.8 9.4 9.0    Liver Function Tests: Recent Labs  Lab 11/07/22 1318 11/10/22 0135 11/11/22 0825  AST 24 18 23   ALT 14 11 15   ALKPHOS 127* 113 120  BILITOT 0.6 0.4 0.7  PROT 7.4 7.1 6.8  ALBUMIN 3.7 3.8 3.5   No results for input(s): "LIPASE", "AMYLASE" in the last 168 hours. No results for input(s):  "AMMONIA" in the last 168 hours.  CBC: Recent Labs  Lab 11/07/22 1318 11/10/22 0135 11/11/22 0825  WBC 6.6 8.0 7.4  NEUTROABS 5.3  --   --   HGB 8.8* 8.2* 9.0*  HCT 28.9* 26.6* 29.3*  MCV 90.6 92.0 93.0  PLT 263 274 317    Cardiac Enzymes: No results for input(s): "CKTOTAL", "CKMB", "CKMBINDEX", "TROPONINI" in the last 168 hours.  BNP: Invalid input(s): "POCBNP"  CBG: Recent Labs  Lab 11/11/22 0750  GLUCAP 89    Microbiology: Results for orders placed or performed during the hospital encounter of 11/07/22  SARS Coronavirus 2 by RT PCR (hospital order, performed in [facility] hospital lab) *cepheid single result test* Anterior Nasal Swab     Status: None   Collection Time: 11/07/22  1:51 PM   Specimen: Anterior Nasal Swab  Result Value Ref Range Status   SARS  Coronavirus 2 by RT PCR NEGATIVE NEGATIVE Final    Comment: Performed at [facility], [address] [place], [place] [address]   *Note: Due to a large number of results and/or encounters for the requested time period, some results have not been displayed. A complete set of results can be found in Results Review.    Coagulation Studies: No results for input(s): "LABPROT", "INR" in the last 72 hours.  Urinalysis: No results for input(s): "COLORURINE", "LABSPEC", "PHURINE", "GLUCOSEU", "HGBUR", "BILIRUBINUR", "KETONESUR", "PROTEINUR", "UROBILINOGEN", "NITRITE", "LEUKOCYTESUR" in the last 72 hours.  Invalid input(s): "APPERANCEUR"    Imaging: No results found.   Assessment & Plan: Tiffany [name] is a 62 y.o.  female with an extensive past medical history including schizophrenia, hypertension, CHF, diabetes, stroke, and end-stage renal disease on hemodialysis, who was admitted to [facility] on 11/10/2022 for Schizoaffective disorder (HCC) [F25.9]   End-stage renal disease on hemodialysis.  Will maintain MWF schedule during this admission.  Receiving treatment today, UF 1.5 L as tolerated.  Next  treatment scheduled for Monday.  2. Anemia of chronic kidney disease Lab Results  Component Value Date   HGB 9.0 (L) 11/11/2022  Hemoglobin acceptable.  Will consider low-dose EPO with next dialysis treatment.  3. Secondary Hyperparathyroidism: with outpatient labs: None available Lab Results  Component Value Date   PTH 57 02/18/2020   CALCIUM 9.0 11/11/2022   CAION 1.16 06/02/2022   PHOS 5.0 (H) 06/23/2022    Will continue to monitor bone minerals during this admission.  Prescribed calcitriol outpatient, will continue this during this admission.  4.  Hypertension with chronic kidney disease.  Home regimen includes amlodipine, carvedilol, and torsemide.  Currently receiving these medications.  LOS: 1 Genise Strack 10/26/20241:39 PM

## 2022-11-11 NOTE — Plan of Care (Signed)
  Problem: Education: Goal: Emotional status will improve Outcome: Progressing   Problem: Activity: Goal: Interest or engagement in activities will improve Outcome: Progressing   

## 2022-11-11 NOTE — Group Note (Signed)
Date:  11/11/2022 Time:  2:56 PM  Group Topic/Focus:  Self Care:   The focus of this group is to help patients understand the importance of self-care in order to improve or restore emotional, physical, spiritual, interpersonal, and financial health.    Participation Level:  Did Not Attend  Participation Quality:    Affect:    Cognitive:    Insight:   Engagement in Group:    Modes of Intervention:    Additional Comments:    Tiffany [name] 11/11/2022, 2:56 PM

## 2022-11-11 NOTE — Group Note (Signed)
Date:  11/11/2022 Time:  12:06 PM  Group Topic/Focus:  Emotional Education:   The focus of this group is to discuss what feelings/emotions are, and how they are experienced.    Participation Level:  Active  Participation Quality:  Appropriate  Affect:  Appropriate  Cognitive:  Appropriate  Insight: Appropriate  Engagement in Group:  Engaged  Modes of Intervention:  Activity  Additional Comments:    Tiffany [name] 11/11/2022, 12:06 PM

## 2022-11-11 NOTE — [facility] Suicide Risk Assessment (Signed)
[facility] Admission Suicide Risk Assessment   Nursing information obtained from:  Patient Demographic factors:  Living alone, Low socioeconomic status Current Mental Status:  Thoughts of violence towards others Loss Factors:  Decline in physical health Historical Factors:  NA Risk Reduction Factors:  Positive coping skills or problem solving skills  Total Time spent with patient: 1 hour Principal Problem: Schizoaffective disorder (HCC) Diagnosis:  Principal Problem:   Schizoaffective disorder (HCC)  Subjective Data: Tiffany [name] is a 62 y.o. female with complex history of multiple medical problems including HTN, ESRD recently started HD MWF and schizophrenia on medications but no longer has a regular psychiatrist. She called mobile crisis tonight due to feeling paranoid that everyone was looking at her like something was wrong with her and getting angry with her family who was visiting her. She has been depressed as well. Apparently mobile crisis center called EMS and [facility] brought her here for evaluation. She denies SI.    Continued Clinical Symptoms:  Alcohol Use Disorder Identification Test Final Score (AUDIT): 0 The "Alcohol Use Disorders Identification Test", Guidelines for Use in Primary Care, Second Edition.  World Science writer [facility]). Score between 0-7:  no or low risk or alcohol related problems. Score between 8-15:  moderate risk of alcohol related problems. Score between 16-19:  high risk of alcohol related problems. Score 20 or above:  warrants further diagnostic evaluation for alcohol dependence and treatment.   CLINICAL FACTORS:   Severe Anxiety and/or Agitation Depression:   Aggression Medical Diagnoses and Treatments/Surgeries   Musculoskeletal: Strength & Muscle Tone: within normal limits Gait & Station: unable to stand Patient leans: N/A  Psychiatric Specialty Exam:  Presentation  General Appearance: No data recorded Eye Contact:No data recorded Speech:No  data recorded Speech Volume:No data recorded Handedness:No data recorded  Mood and Affect  Mood:No data recorded Affect:No data recorded  Thought Process  Thought Processes:No data recorded Descriptions of Associations:No data recorded Orientation:No data recorded Thought Content:No data recorded History of Schizophrenia/Schizoaffective disorder:Yes  Duration of Psychotic Symptoms:Less than six months  Hallucinations:No data recorded Ideas of Reference:No data recorded Suicidal Thoughts:No data recorded Homicidal Thoughts:No data recorded  Sensorium  Memory:No data recorded Judgment:No data recorded Insight:No data recorded  Executive Functions  Concentration:No data recorded Attention Span:No data recorded Recall:No data recorded Fund of Knowledge:No data recorded Language:No data recorded  Psychomotor Activity  Psychomotor Activity:No data recorded  Assets  Assets:No data recorded  Sleep  Sleep:No data recorded    Blood pressure (!) 145/75, pulse 77, temperature 98.1 F (36.7 C), resp. rate 16, height 5\' 3"  (1.6 m), weight 88.5 kg, SpO2 100%. Body mass index is 34.54 kg/m.   COGNITIVE FEATURES THAT CONTRIBUTE TO RISK:  Closed-mindedness    SUICIDE RISK:   Minimal: No identifiable suicidal ideation.  Patients presenting with no risk factors but with morbid ruminations; may be classified as minimal risk based on the severity of the depressive symptoms  PLAN OF CARE: See orders  I certify that inpatient services furnished can reasonably be expected to improve the patient's condition.   Nevin Grizzle [place], DO 11/11/2022, 11:01 AM

## 2022-11-11 NOTE — Group Note (Signed)
LCSW Group Therapy Note  Group Date: 11/11/2022 Start Time: 1308 End Time: 1345   Type of Therapy and Topic:  Group Therapy - Healthy vs Unhealthy Coping Skills  Participation Level:  Did Not Attend   Description of Group The focus of this group was to determine what unhealthy coping techniques typically are used by group members and what healthy coping techniques would be helpful in coping with various problems. Patients were guided in becoming aware of the differences between healthy and unhealthy coping techniques. Patients were asked to identify 2-3 healthy coping skills they would like to learn to use more effectively.  Therapeutic Goals Patients learned that coping is what human beings do all day long to deal with various situations in their lives Patients defined and discussed healthy vs unhealthy coping techniques Patients identified their preferred coping techniques and identified whether these were healthy or unhealthy Patients determined 2-3 healthy coping skills they would like to become more familiar with and use more often. Patients provided support and ideas to each other   Summary of Patient Progress:  The patient did not attend group.     Marshell Levan, LCSWA 11/11/2022  2:14 PM

## 2022-11-11 NOTE — Plan of Care (Signed)
  Problem: Education: Goal: Emotional status will improve Outcome: Progressing   Problem: Activity: Goal: Interest or engagement in activities will improve Outcome: Progressing   Problem: Safety: Goal: Periods of time without injury will increase Outcome: Progressing

## 2022-11-12 DIAGNOSIS — F250 Schizoaffective disorder, bipolar type: Secondary | ICD-10-CM | POA: Diagnosis not present

## 2022-11-12 NOTE — Progress Notes (Signed)
Patient with animated affect.  Present in the dayroom for breakfast.  Endorses anxiety and depression.  Denies SI/HI and AVH. Denies pain at this time.  Patient given bed bath by MHT.    Compliant with scheduled medications. 15 min checks and 1:1 sitter in place for safety.  Patient returned to room after breakfast and lunch to lay down.  Appropriate interaction with peers and staff.    Pt became agitated when TV was channel was changed.  Per patient, she was not asked if she was watching the current show.  RN listened to pt and validated her feelings.   0700:  Patient in room.  Sitter at bedside.  Calm and cooperative.  0800:  Patient in dayroom.  Sitter with patient.  Calm and cooperative.  0900:  Patient in room.  Sitter at bedside.  Calm and cooperative.  1000:  Patient in room.  Sitter at bedside.  Calm and cooperative.  1100:  Patient in room.  Sitter at bedside.  Calm and cooperative.  1200:  Patient in dayroom.  Sitter with patient.  Calm and cooperative. 1300:  Patient in room.  Sitter at bedside.  Calm and cooperative. 1400:  Patient in dayroom.  Sitter with patient.  Calm and cooperative. 1500:  Patient in dayroom.  Sitter with patient.  Calm and cooperative. 1600:  Patient in dayroom.  Sitter with patient.  Irritable, but composed.  1700:  Patient in room.  Sitter with patient.  Calm and cooperative. 1800:  Patient in room.  Sitter at bedside.  Calm and cooperative.

## 2022-11-12 NOTE — [facility] Suicide Risk Assessment (Signed)
[facility] INPATIENT:  Family/Significant Other Suicide Prevention Education  Suicide Prevention Education:  Patient Refusal for Family/Significant Other Suicide Prevention Education: The patient Tiffany [name] has refused to provide written consent for family/significant other to be provided Family/Significant Other Suicide Prevention Education during admission and/or prior to discharge.  Physician notified.  Marshell Levan 11/12/2022, 6:33 PM
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
## 2022-11-12 NOTE — Group Note (Signed)
Date:  11/12/2022 Time:  10:26 PM  Group Topic/Focus:  Making Healthy Choices:   The focus of this group is to help patients identify negative/unhealthy choices they were using prior to admission and identify positive/healthier coping strategies to replace them upon discharge.    Participation Level:  Active  Participation Quality:  Appropriate  Affect:  Appropriate  Cognitive:  Appropriate  Insight: Appropriate  Engagement in Group:  Engaged  Modes of Intervention:  Discussion  Additional Comments:    Maeola Harman 11/12/2022, 10:26 PM

## 2022-11-12 NOTE — Progress Notes (Signed)
   11/12/22 0400  Psychosocial Assessment  Patient Complaints Depression  Eye Contact Fair  Facial Expression Animated  Affect Appropriate to circumstance  Speech Logical/coherent  Interaction Assertive  Motor Activity Other (Comment) (chairfast)  Appearance/Hygiene In scrubs  Behavior Characteristics Cooperative  Mood Pleasant  Thought Process  Coherency WDL  Content WDL  Delusions None reported or observed  Perception WDL  Hallucination None reported or observed  Judgment Impaired  Confusion None  Danger to Self  Current suicidal ideation? Denies  Danger to Others  Danger to Others None reported or observed  Danger to Others Abnormal  Destructive Behavior No threats or harm toward property

## 2022-11-12 NOTE — Progress Notes (Signed)
   11/12/22 0630  15 Minute Checks  Location Bedroom  Visual Appearance Calm  Behavior Sleeping  Sleep (Behavioral Health Patients Only)  Calculate sleep? (Click Yes once per 24 hr at 0600 safety check) Yes  Documented sleep last 24 hours 7.25

## 2022-11-12 NOTE — Plan of Care (Signed)
?  Problem: Education: ?Goal: Knowledge of [facility] Education information/materials will improve ?Outcome: Progressing ?Goal: Emotional status will improve ?Outcome: Progressing ?Goal: Mental status will improve ?Outcome: Progressing ?Goal: Verbalization of understanding the information provided will improve ?Outcome: Progressing ?  ?Problem: Activity: ?Goal: Interest or engagement in activities will improve ?Outcome: Progressing ?Goal: Sleeping patterns will improve ?Outcome: Progressing ?  ?Problem: Coping: ?Goal: Ability to verbalize frustrations and anger appropriately will improve ?Outcome: Progressing ?Goal: Ability to demonstrate self-control will improve ?Outcome: Progressing ?  ?Problem: Health Behavior/Discharge Planning: ?Goal: Identification of resources available to assist in meeting health care needs will improve ?Outcome: Progressing ?Goal: Compliance with treatment plan for underlying cause of condition will improve ?Outcome: Progressing ?  ?Problem: Physical Regulation: ?Goal: Ability to maintain clinical measurements within normal limits will improve ?Outcome: Progressing ?  ?Problem: Safety: ?Goal: Periods of time without injury will increase ?Outcome: Progressing ?  ?Problem: Education: ?Goal: Ability to state activities that reduce stress will improve ?Outcome: Progressing ?  ?

## 2022-11-12 NOTE — Progress Notes (Signed)
[facility] MD Progress Note  11/12/2022 11:23 AM Tiffany [name]  MRN:  [id number] Subjective: Tiffany [name] is seen on rounds.  [name] pleasant and cooperative but needs a lot of care.  She apparently refused to finish her dialysis yesterday.  Nurses report that physical therapy will not work with her because they know her and she is at baseline.  She currently denies any suicidal ideation.  She been compliant with medications and no side effects. Principal Problem: Schizoaffective disorder (HCC) Diagnosis: Principal Problem:   Schizoaffective disorder (HCC)  Total Time spent with patient: 15 minutes  Past Psychiatric History: Schizoaffective disorder  Past Medical History:  Past Medical History:  Diagnosis Date   Abdominal pain    Acute encephalopathy 11/21/2017   Acute exacerbation of CHF (congestive heart failure) (HCC) 03/03/2022   Acute GI bleeding 03/29/2021   Acute kidney injury superimposed on chronic kidney disease (HCC) 02/18/2020   Acute metabolic encephalopathy 02/20/2020   Acute on chronic diastolic heart failure (HCC) 02/04/2022   Acute on chronic renal failure (HCC) 06/02/2022   Aggressive behavior    Agitation 11/22/2017   Anoxic brain injury (HCC) 09/08/2016   C. Arrest due to respiratory failure and COPD exacerbation   Anxiety    Arthritis    "all over" (04/10/2016)   Asthma 10/18/2010   Binge eating disorder    Blood loss anemia 04/24/2011   CAP (community acquired pneumonia) 06/22/2015   Cardiac arrest (HCC) 09/08/2016   PEA   Carotid artery stenosis    1-39% bilateral by dopplers 11/2016   Chronic diastolic (congestive) heart failure (HCC)    Chronic kidney disease, stage 3 (HCC)    Chronic kidney disease, stage 3b (HCC) 06/06/2021   Chronic pain syndrome 06/18/2012   Chronic post-traumatic stress disorder (PTSD) 05/27/2018   Chronic respiratory failure with hypoxia and hypercapnia (HCC) 06/22/2015   TRILOGY Vent >AVAPA-ES., Vt target 200-400, Max P 30 , PS max 20 ,  PS min 6-10 , E Max 6, E Min 4, Rate Auto AVAPS Rate 2 (titrate for pt comfort) , bleed O2 at 5l/m continuous flow .    CKD (chronic kidney disease) 02/03/2022   Closed displaced fracture of fifth metacarpal bone 03/21/2018   Cocaine use disorder, severe, in sustained remission (HCC) 12/17/2015   Complication of anesthesia    decreased bp, decreased heart rate   COPD (chronic obstructive pulmonary disease) (HCC) 07/08/2014   Delusional disorder, persecutory type (HCC) 06/26/2021   Depression    Diabetic neuropathy (HCC) 04/24/2011   Difficulty with speech 01/24/2018   Disorder of nervous system    Drug abuse (HCC) 11/21/2017   Dyslipidemia 04/24/2011   E. coli UTI 02/20/2020   Elevated troponin 04/28/2012   Emphysema    Encephalopathy 11/21/2017   Essential hypertension 03/22/2016   Facet arthropathy, lumbar 06/12/2022   Fibula fracture 07/10/2016   Frequent falls 10/11/2017   GERD (gastroesophageal reflux disease)    GI bleed 03/30/2021   Gout 04/11/2017   Heart attack (HCC) 1980s   Hematochezia    History of blood transfusion 1994   "couldn't stop bleeding from my period"   History of drug abuse in remission (HCC) 11/28/2015   Quit in 2017   Hyperlipidemia 03/31/2021   Hyperlipidemia LDL goal <70    Hypocalcemia    Hypokalemia    Hypomagnesemia    Incontinence    Manic depression (HCC)    Morbid obesity (HCC) 10/18/2010   Obstructive sleep apnea 10/18/2010   On home oxygen  therapy    "6L; 24/7" (04/10/2016)   OSA on CPAP    "wear mask sometimes" (04/10/2016)   Painless rectal bleeding 12/31/2015   Paranoid (HCC)    "sometimes; I'm on RX for it" (04/10/2016)   Periumbilical abdominal pain    Prolonged Q-T interval on ECG    Rectal bleeding 12/31/2015   Recurrent syncope 07/09/2021   Rhabdomyolysis 06/16/2021   Schizoaffective disorder, bipolar type (HCC) 04/05/2018   Seasonal allergies    Seborrheic keratoses 12/31/2013   Seizures (HCC)    "don't know what kind;  last one was ~ 1 yr ago" (04/10/2016)   Sinus bradycardia    Skin ulcer of sacrum, limited to breakdown of skin (HCC) 03/08/2022   Spondylolisthesis at L3-L4 level 06/12/2022   Stroke (HCC) 1980s   denies residual on 04/10/2016   Thrush 09/19/2013   Type 2 diabetes mellitus (HCC) 10/18/2010    Past Surgical History:  Procedure Laterality Date   CESAREAN SECTION  1997   COLONOSCOPY WITH PROPOFOL N/A 04/01/2021   Procedure: COLONOSCOPY WITH PROPOFOL;  Surgeon: Jeani Hawking, MD;  Location: [facility] ENDOSCOPY;  Service: Gastroenterology;  Laterality: N/A;  Rectal bleeding with drop in hemoglobin to 7.2 g/dL   HERNIA REPAIR     IR CHOLANGIOGRAM EXISTING TUBE  07/20/2016   IR PERC CHOLECYSTOSTOMY  05/10/2016   IR RADIOLOGIST EVAL & MGMT  06/08/2016   IR RADIOLOGIST EVAL & MGMT  06/29/2016   IR SINUS/FIST TUBE CHK-NON GI  07/12/2016   RIGHT/LEFT HEART CATH AND CORONARY ANGIOGRAPHY N/A 06/19/2017   Procedure: RIGHT/LEFT HEART CATH AND CORONARY ANGIOGRAPHY;  Surgeon: Dolores Patty, MD;  Location: [facility] INVASIVE CV LAB;  Service: Cardiovascular;  Laterality: N/A;   TIBIA IM NAIL INSERTION Right 07/12/2016   Procedure: INTRAMEDULLARY (IM) NAIL RIGHT TIBIA;  Surgeon: Tarry Kos, MD;  Location: [facility] OR;  Service: Orthopedics;  Laterality: Right;   UMBILICAL HERNIA REPAIR  ~ 1963   "that's why I don't have a belly button"   VAGINAL HYSTERECTOMY     Family History:  Family History  Problem Relation Age of Onset   Cancer Mother        lung   Depression Mother    Cancer Father        prostate   Depression Sister    Anxiety disorder Sister    Schizophrenia Sister    Bipolar disorder Sister    Depression Sister    Depression Brother    Heart failure Other        cousin   Family Psychiatric  History: Unremarkable Social History:  Social History   Substance and Sexual Activity  Alcohol Use No   Alcohol/week: 0.0 standard drinks of alcohol     Social History   Substance and Sexual Activity  Drug  Use Not Currently   Types: Cocaine   Comment: 04/10/2016 "last used cocaine back in November 2017"    Social History   Socioeconomic History   Marital status: Widowed    Spouse name: Not on file   Number of children: 3   Years of education: 12th   Highest education level: 12th grade  Occupational History   Occupation: disabled    Comment: factory production  Tobacco Use   Smoking status: Former    Current packs/day: 0.00    Average packs/day: 1.5 packs/day for 39.1 years (58.6 ttl pk-yrs)    Types: Cigarettes    Start date: 03/13/1977    Quit date: 04/10/2016    Years  since quitting: 6.5    Passive exposure: Past   Smokeless tobacco: Never  Vaping Use   Vaping status: Never Used  Substance and Sexual Activity   Alcohol use: No    Alcohol/week: 0.0 standard drinks of alcohol   Drug use: Not Currently    Types: Cocaine    Comment: 04/10/2016 "last used cocaine back in November 2017"   Sexual activity: Not Currently    Birth control/protection: Surgical  Other Topics Concern   Not on file  Social History Narrative   Has 1 son, [name]   Lives with son and his boyfriend   Her house has ramps and handrails should she ever needs them.    Her mother lives down the street from her and is a good support person in addition to her son.   She drives herself, has private transportation.    Cocaine free since 02/24/16, smoke free since 04/10/16   Social Determinants of Health   Financial Resource Strain: Low Risk  (07/11/2022)   Overall Financial Resource Strain (CARDIA)    Difficulty of Paying Living Expenses: Not very hard  Food Insecurity: Food Insecurity Present (11/10/2022)   Hunger Vital Sign    Worried About Running Out of Food in the Last Year: Sometimes true    Ran Out of Food in the Last Year: Sometimes true  Transportation Needs: No Transportation Needs (11/10/2022)   PRAPARE - Administrator, Civil Service (Medical): No    Lack of Transportation (Non-Medical):  No  Physical Activity: Inactive (07/11/2022)   Exercise Vital Sign    Days of Exercise per Week: 0 days    Minutes of Exercise per Session: 0 min  Stress: Stress Concern Present (07/11/2022)   [employer] of Occupational Health - Occupational Stress Questionnaire    Feeling of Stress : To some extent  Social Connections: Socially Isolated (07/11/2022)   Social Connection and Isolation Panel [NHANES]    Frequency of Communication with Friends and Family: Three times a week    Frequency of Social Gatherings with Friends and Family: Three times a week    Attends Religious Services: Never    Active Member of Clubs or Organizations: No    Attends Banker Meetings: Never    Marital Status: Widowed   Additional Social History:                         Sleep: Good  Appetite:  Good  Current Medications: Current Facility-Administered Medications  Medication Dose Route Frequency Provider Last Rate Last Admin   acetaminophen (TYLENOL) tablet 650 mg  650 mg Oral Q6H PRN Bing Neighbors, NP   650 mg at 11/11/22 0642   albuterol (VENTOLIN HFA) 108 (90 Base) MCG/ACT inhaler 2 puff  2 puff Inhalation Q6H PRN Bing Neighbors, NP       amLODipine (NORVASC) tablet 10 mg  10 mg Oral Daily Bing Neighbors, NP   10 mg at 11/12/22 0842   asenapine (SAPHRIS) sublingual tablet 5 mg  5 mg Sublingual BID Bing Neighbors, NP   5 mg at 11/12/22 0844   atorvastatin (LIPITOR) tablet 20 mg  20 mg Oral Daily Bing Neighbors, NP   20 mg at 11/12/22 6213   calcitRIOL (ROCALTROL) capsule 0.5 mcg  0.5 mcg Oral Daily Bing Neighbors, NP   0.5 mcg at 11/12/22 0843   carvedilol (COREG) tablet 12.5 mg  12.5 mg Oral BID WC Harris,  Godfrey Pick, NP   12.5 mg at 11/12/22 0843   Chlorhexidine Gluconate Cloth 2 % PADS 6 each  6 each Topical Q0600 Wendee Beavers, NP       cyclobenzaprine (FLEXERIL) tablet 10 mg  10 mg Oral TID Sarina Ill, DO   10 mg at 11/12/22 0845    dicyclomine (BENTYL) capsule 10 mg  10 mg Oral TID Deneen Harts, NP   10 mg at 11/12/22 0844   diphenhydrAMINE (BENADRYL) capsule 50 mg  50 mg Oral TID PRN Bing Neighbors, NP   50 mg at 11/11/22 0255   Or   diphenhydrAMINE (BENADRYL) injection 50 mg  50 mg Intramuscular TID PRN Bing Neighbors, NP       DULoxetine (CYMBALTA) DR capsule 30 mg  30 mg Oral Daily Bing Neighbors, NP   30 mg at 11/12/22 0844   escitalopram (LEXAPRO) tablet 20 mg  20 mg Oral Daily Bing Neighbors, NP   20 mg at 11/12/22 0844   ferrous sulfate tablet 325 mg  325 mg Oral Maryland Pink, NP   325 mg at 11/11/22 0903   fluticasone furoate-vilanterol (BREO ELLIPTA) 100-25 MCG/ACT 1 puff  1 puff Inhalation Daily Bing Neighbors, NP   1 puff at 11/12/22 0845   And   umeclidinium bromide (INCRUSE ELLIPTA) 62.5 MCG/ACT 1 puff  1 puff Inhalation Daily Bing Neighbors, NP   1 puff at 11/12/22 0845   folic acid (FOLVITE) tablet 1 mg  1 mg Oral Daily Bing Neighbors, NP   1 mg at 11/12/22 0844   haloperidol (HALDOL) tablet 5 mg  5 mg Oral TID PRN Bing Neighbors, NP   5 mg at 11/11/22 0255   Or   haloperidol lactate (HALDOL) injection 5 mg  5 mg Intramuscular TID PRN Bing Neighbors, NP       hydrOXYzine (ATARAX) tablet 25 mg  25 mg Oral TID PRN Bing Neighbors, NP   25 mg at 11/11/22 2203   influenza vac split trivalent PF (FLULAVAL) injection 0.5 mL  0.5 mL Intramuscular Tomorrow-1000 Sarina Ill, DO       lamoTRIgine (LAMICTAL) tablet 100 mg  100 mg Oral Daily Bing Neighbors, NP   100 mg at 11/12/22 0845   LORazepam (ATIVAN) tablet 2 mg  2 mg Oral TID PRN Bing Neighbors, NP   2 mg at 11/10/22 2244   Or   LORazepam (ATIVAN) injection 2 mg  2 mg Intramuscular TID PRN Bing Neighbors, NP       OLANZapine (ZYPREXA) tablet 10 mg  10 mg Oral QHS Bing Neighbors, NP       pantoprazole (PROTONIX) EC tablet 80 mg  80 mg Oral Daily Bing Neighbors, NP   80 mg  at 11/12/22 0844   prazosin (MINIPRESS) capsule 2 mg  2 mg Oral QHS Bing Neighbors, NP       torsemide (DEMADEX) tablet 20 mg  20 mg Oral BID Bing Neighbors, NP   20 mg at 11/12/22 1191    Lab Results:  Results for orders placed or performed during the hospital encounter of 11/10/22 (from the past 48 hour(s))  Glucose, capillary     Status: None   Collection Time: 11/11/22  7:50 AM  Result Value Ref Range   Glucose-Capillary 89 70 - 99 mg/dL    Comment: Glucose reference range applies only to samples taken after fasting  for at least 8 hours.  CBC     Status: Abnormal   Collection Time: 11/11/22  8:25 AM  Result Value Ref Range   WBC 7.4 4.0 - 10.5 K/uL   RBC 3.15 (L) 3.87 - 5.11 MIL/uL   Hemoglobin 9.0 (L) 12.0 - 15.0 g/dL   HCT 62.1 (L) 30.8 - 65.7 %   MCV 93.0 80.0 - 100.0 fL   MCH 28.6 26.0 - 34.0 pg   MCHC 30.7 30.0 - 36.0 g/dL   RDW 84.6 96.2 - 95.2 %   Platelets 317 150 - 400 K/uL   nRBC 0.0 0.0 - 0.2 %    Comment: Performed at [facility], [address] [place], [place] [address]  Comprehensive metabolic panel     Status: Abnormal   Collection Time: 11/11/22  8:25 AM  Result Value Ref Range   Sodium 139 135 - 145 mmol/L   Potassium 3.5 3.5 - 5.1 mmol/L   Chloride 102 98 - 111 mmol/L   CO2 27 22 - 32 mmol/L   Glucose, Bld 97 70 - 99 mg/dL    Comment: Glucose reference range applies only to samples taken after fasting for at least 8 hours.   BUN 23 8 - 23 mg/dL   Creatinine, Ser 4.40 (H) 0.44 - 1.00 mg/dL   Calcium 9.0 8.9 - 10.2 mg/dL   Total Protein 6.8 6.5 - 8.1 g/dL   Albumin 3.5 3.5 - 5.0 g/dL   AST 23 15 - 41 U/L   ALT 15 0 - 44 U/L   Alkaline Phosphatase 120 38 - 126 U/L   Total Bilirubin 0.7 0.3 - 1.2 mg/dL   GFR, Estimated 13 (L) >60 mL/min    Comment: (NOTE) Calculated using the CKD-EPI Creatinine Equation (2021)    Anion gap 10 5 - 15    Comment: Performed at [facility], [address] [place], [place] [address]   Hepatitis B surface antigen     Status: None   Collection Time: 11/11/22 12:05 PM  Result Value Ref Range   Hepatitis B Surface Ag NON REACTIVE NON REACTIVE    Comment: Performed at [facility], [address] [place], [place] [address]   *Note: Due to a large number of results and/or encounters for the requested time period, some results have not been displayed. A complete set of results can be found in Results Review.    Blood Alcohol level:  Lab Results  Component Value Date   ETH <10 11/10/2022   ETH <10 06/21/2022    Metabolic Disorder Labs: Lab Results  Component Value Date   HGBA1C 5.0 01/05/2022   MPG 99.67 06/17/2021   MPG 105.41 07/23/2020   No results found for: "PROLACTIN" Lab Results  Component Value Date   CHOL 127 08/05/2018   TRIG 53 08/05/2018   HDL 62 08/05/2018   CHOLHDL 2.0 08/05/2018   VLDL 38 01/07/2017   LDLCALC 54 08/05/2018   LDLCALC 56 01/07/2017    Physical Findings: AIMS:  , ,  ,  ,    CIWA:    COWS:     Musculoskeletal: Strength & Muscle Tone: within normal limits Gait & Station: Unable to stand Patient leans: N/A  Psychiatric Specialty Exam:  Presentation  General Appearance: No data recorded Eye Contact:No data recorded Speech:No data recorded Speech Volume:No data recorded Handedness:No data recorded  Mood and Affect  Mood:No data recorded Affect:No data recorded  Thought Process  Thought Processes:No data recorded  Descriptions of Associations:No data recorded Orientation:No data recorded Thought Content:No data recorded History of Schizophrenia/Schizoaffective disorder:Yes  Duration of Psychotic Symptoms:Less than six months  Hallucinations:No data recorded Ideas of Reference:No data recorded Suicidal Thoughts:No data recorded Homicidal Thoughts:No data recorded  Sensorium  Memory:No data recorded Judgment:No data recorded Insight:No data recorded  Executive Functions  Concentration:No data  recorded Attention Span:No data recorded Recall:No data recorded Fund of Knowledge:No data recorded Language:No data recorded  Psychomotor Activity  Psychomotor Activity:No data recorded  Assets  Assets:No data recorded  Sleep  Sleep:No data recorded    Blood pressure (!) 173/86, pulse 79, temperature 98 F (36.7 C), resp. rate 16, height 5\' 3"  (1.6 m), weight 88.5 kg, SpO2 100%. Body mass index is 34.54 kg/m.   Treatment Plan Summary: Daily contact with patient to assess and evaluate symptoms and progress in treatment, Medication management, and Plan continue current medications.  Sarina Ill, DO 11/12/2022, 11:23 AM

## 2022-11-12 NOTE — Progress Notes (Signed)
Patient continues to be agitated about the changing of the TV channel.  Refused peri care.  Refused to have wound on bottom covered with foam dressing.  Per patient, her "issue" was not resolved and since staff did nothing for her, she was not complying with staff.   Patient acknowledged the wound was present, but stated she did not need the dressing / peri care.   Patient did allow MHT to complete peri care after RNs left the room.  Continues to refuse foam dressing.

## 2022-11-12 NOTE — Plan of Care (Signed)
?  Problem: Education: ?Goal: Knowledge of [facility] Education information/materials will improve ?Outcome: Progressing ?Goal: Emotional status will improve ?Outcome: Progressing ?  ?Problem: Safety: ?Goal: Periods of time without injury will increase ?Outcome: Progressing ?  ?

## 2022-11-13 ENCOUNTER — Encounter (HOSPITAL_COMMUNITY): Payer: Medicare HMO

## 2022-11-13 DIAGNOSIS — F250 Schizoaffective disorder, bipolar type: Secondary | ICD-10-CM | POA: Diagnosis not present

## 2022-11-13 LAB — RENAL FUNCTION PANEL
Albumin: 3.3 g/dL — ABNORMAL LOW (ref 3.5–5.0)
Anion gap: 5 (ref 5–15)
BUN: 31 mg/dL — ABNORMAL HIGH (ref 8–23)
CO2: 30 mmol/L (ref 22–32)
Calcium: 9 mg/dL (ref 8.9–10.3)
Chloride: 99 mmol/L (ref 98–111)
Creatinine, Ser: 3.7 mg/dL — ABNORMAL HIGH (ref 0.44–1.00)
GFR, Estimated: 13 mL/min — ABNORMAL LOW (ref >60)
Glucose, Bld: 89 mg/dL (ref 70–99)
Phosphorus: 2.2 mg/dL — ABNORMAL LOW (ref 2.5–4.6)
Potassium: 3.6 mmol/L (ref 3.5–5.1)
Sodium: 134 mmol/L — ABNORMAL LOW (ref 135–145)

## 2022-11-13 LAB — GLUCOSE, CAPILLARY: Glucose-Capillary: 91 mg/dL (ref 70–99)

## 2022-11-13 LAB — CBC
HCT: 26.1 % — ABNORMAL LOW (ref 36.0–46.0)
Hemoglobin: 8 g/dL — ABNORMAL LOW (ref 12.0–15.0)
MCH: 28.6 pg (ref 26.0–34.0)
MCHC: 30.7 g/dL (ref 30.0–36.0)
MCV: 93.2 fL (ref 80.0–100.0)
Platelets: 317 10*3/uL (ref 150–400)
RBC: 2.8 MIL/uL — ABNORMAL LOW (ref 3.87–5.11)
RDW: 15.4 % (ref 11.5–15.5)
WBC: 6.4 10*3/uL (ref 4.0–10.5)
nRBC: 0 % (ref 0.0–0.2)

## 2022-11-13 MED ORDER — HEPARIN SODIUM (PORCINE) 1000 UNIT/ML DIALYSIS: 1000.0000 [IU] | INTRAMUSCULAR | Status: DC | PRN

## 2022-11-13 MED ORDER — ALTEPLASE 2 MG IJ SOLR
2.0000 mg | Freq: Once | INTRAMUSCULAR | Status: DC | PRN
Start: 2022-11-13 — End: 2022-11-16

## 2022-11-13 MED ORDER — ALTEPLASE 2 MG IJ SOLR: 2.0000 mg | Freq: Once | INTRAMUSCULAR | Status: DC | PRN

## 2022-11-13 MED ORDER — HEPARIN SODIUM (PORCINE) 1000 UNIT/ML DIALYSIS
1000.0000 [IU] | INTRAMUSCULAR | Status: DC | PRN
  Administered 2022-11-17: 1000 [IU]
  Filled 2022-11-13: qty 1

## 2022-11-13 MED ORDER — EPOETIN ALFA 4000 UNIT/ML IJ SOLN
4000.0000 [IU] | INTRAMUSCULAR | Status: DC
  Administered 2022-11-13 – 2022-11-17 (×3): 4000 [IU] via INTRAVENOUS

## 2022-11-13 MED ORDER — LABETALOL HCL 5 MG/ML IV SOLN
INTRAVENOUS | Status: AC
Start: 2022-11-13 — End: ?
  Filled 2022-11-13: qty 4

## 2022-11-13 MED ORDER — LABETALOL HCL 5 MG/ML IV SOLN
5.0000 mg | Freq: Once | INTRAVENOUS | Status: AC
  Administered 2022-11-13: 5 mg via INTRAVENOUS

## 2022-11-13 NOTE — Group Note (Signed)
Date:  11/13/2022 Time:  11:21 AM  Group Topic/Focus:  Developing a Wellness Toolbox:   The focus of this group is to help patients develop a "wellness toolbox" with skills and strategies to promote recovery upon discharge. Dimensions of Wellness:   The focus of this group is to introduce the topic of wellness and discuss the role each dimension of wellness plays in total health.    Participation Level:  Active  Participation Quality:  Appropriate and Attentive  Affect:  Appropriate  Cognitive:  Alert, Appropriate, and Oriented  Insight: Appropriate  Engagement in Group:  Developing/Improving and Engaged  Modes of Intervention:  Activity, Discussion, and Education  Additional Comments:    Rosaura Carpenter 11/13/2022, 11:21 AM

## 2022-11-13 NOTE — Progress Notes (Signed)
Pt received in bed. Cont 02 @ 3L via N/C. Reports racing thoughts and feeling anxious and agitated. PRNs Haldol 5mg , Ativan 2mg  and Benadryl 50mg  po given PRN as ordered for agitation and restlessness. 1:1 at the bedside for safety. No s/sof resp distress noted.  No c/o pain/discomfort noted. Plan of care continued.

## 2022-11-13 NOTE — Progress Notes (Signed)
   11/13/22 2153  Psychosocial Assessment  Patient Complaints Anxiety;Agitation;Irritability  Behavior Characteristics Anxious;Agitated;Irritable

## 2022-11-13 NOTE — Progress Notes (Signed)
   11/13/22 2300  Psych Admission Type (Psych Patients Only)  Admission Status Voluntary  Psychosocial Assessment  Patient Complaints Anxiety  Eye Contact Fair  Facial Expression Flat  Affect Anxious  Speech Logical/coherent  Interaction Arrogant  Motor Activity Slow  Appearance/Hygiene In scrubs  Behavior Characteristics Anxious  Mood Pleasant  Thought Process  Coherency WDL  Content WDL  Delusions None reported or observed  Perception WDL  Hallucination None reported or observed  Judgment Impaired  Confusion None  Danger to Self  Current suicidal ideation? Denies  Danger to Others  Danger to Others None reported or observed  Danger to Others Abnormal  Harmful Behavior to others No threats or harm toward other people  Destructive Behavior No threats or harm toward property

## 2022-11-13 NOTE — Progress Notes (Signed)
[facility]  ROUNDING NOTE   Subjective:   Patient seen and evaluated in dialysis suite Complains of generalized pain Tolerating meals  Seated in chair  Objective:  Vital signs in last 24 hours:  Temp:  [98.4 F (36.9 C)-98.7 F (37.1 C)] 98.5 F (36.9 C) (10/28 0836) Pulse Rate:  [73-78] 73 (10/28 0836) Resp:  [16-18] 18 (10/28 0836) BP: (144-148)/(72-80) 146/72 (10/28 0836) SpO2:  [100 %] 100 % (10/28 0836)  Weight change:  Filed Weights   11/10/22 1512  Weight: 88.5 kg    Intake/Output: No intake/output data recorded.   Intake/Output this shift:  No intake/output data recorded.  Physical Exam: General: NAD, sitting in chair  Head: Normocephalic, atraumatic. Moist oral mucosal membranes  Eyes: Anicteric  Lungs:  Clear to auscultation, room air  Heart: Regular rate and rhythm  Abdomen:  Soft, nontender  Extremities:  trace peripheral edema.  Neurologic: Alert moving all four extremities  Skin: No lesions  Access: Rt chest Permcath    Basic Metabolic Panel: Recent Labs  Lab 11/07/22 1318 11/10/22 0135 11/11/22 0825  NA 135 136 139  K 3.5 3.4* 3.5  CL 98 98 102  CO2 24 28 27   GLUCOSE 80 88 97  BUN 15 17 23   CREATININE 3.09* 3.83* 3.68*  CALCIUM 9.8 9.4 9.0    Liver Function Tests: Recent Labs  Lab 11/07/22 1318 11/10/22 0135 11/11/22 0825  AST 24 18 23   ALT 14 11 15   ALKPHOS 127* 113 120  BILITOT 0.6 0.4 0.7  PROT 7.4 7.1 6.8  ALBUMIN 3.7 3.8 3.5   No results for input(s): "LIPASE", "AMYLASE" in the last 168 hours. No results for input(s): "AMMONIA" in the last 168 hours.  CBC: Recent Labs  Lab 11/07/22 1318 11/10/22 0135 11/11/22 0825  WBC 6.6 8.0 7.4  NEUTROABS 5.3  --   --   HGB 8.8* 8.2* 9.0*  HCT 28.9* 26.6* 29.3*  MCV 90.6 92.0 93.0  PLT 263 274 317    Cardiac Enzymes: No results for input(s): "CKTOTAL", "CKMB", "CKMBINDEX", "TROPONINI" in the last 168 hours.  BNP: Invalid input(s):  "POCBNP"  CBG: Recent Labs  Lab 11/11/22 0750  GLUCAP 89    Microbiology: Results for orders placed or performed during the hospital encounter of 11/07/22  SARS Coronavirus 2 by RT PCR (hospital order, performed in [facility] hospital lab) *cepheid single result test* Anterior Nasal Swab     Status: None   Collection Time: 11/07/22  1:51 PM   Specimen: Anterior Nasal Swab  Result Value Ref Range Status   SARS Coronavirus 2 by RT PCR NEGATIVE NEGATIVE Final    Comment: Performed at [facility], [address] [place], [place] [address]   *Note: Due to a large number of results and/or encounters for the requested time period, some results have not been displayed. A complete set of results can be found in Results Review.    Coagulation Studies: No results for input(s): "LABPROT", "INR" in the last 72 hours.  Urinalysis: No results for input(s): "COLORURINE", "LABSPEC", "PHURINE", "GLUCOSEU", "HGBUR", "BILIRUBINUR", "KETONESUR", "PROTEINUR", "UROBILINOGEN", "NITRITE", "LEUKOCYTESUR" in the last 72 hours.  Invalid input(s): "APPERANCEUR"    Imaging: No results found.   Medications:     amLODipine  10 mg Oral Daily   asenapine  5 mg Sublingual BID   atorvastatin  20 mg Oral Daily   calcitRIOL  0.5 mcg Oral Daily   carvedilol  12.5 mg Oral BID WC  Chlorhexidine Gluconate Cloth  6 each Topical Q0600   cyclobenzaprine  10 mg Oral TID   dicyclomine  10 mg Oral TID AC   DULoxetine  30 mg Oral Daily   escitalopram  20 mg Oral Daily   ferrous sulfate  325 mg Oral QODAY   fluticasone furoate-vilanterol  1 puff Inhalation Daily   And   umeclidinium bromide  1 puff Inhalation Daily   folic acid  1 mg Oral Daily   influenza vac split trivalent PF  0.5 mL Intramuscular Tomorrow-1000   lamoTRIgine  100 mg Oral Daily   OLANZapine  10 mg Oral QHS   pantoprazole  80 mg Oral Daily   prazosin  2 mg Oral QHS   torsemide  20 mg Oral BID   acetaminophen, albuterol,  alteplase, diphenhydrAMINE **OR** diphenhydrAMINE, haloperidol **OR** haloperidol lactate, heparin, hydrOXYzine, LORazepam **OR** LORazepam  Assessment/ Plan:  Tiffany [name] is a 62 y.o.  female  with an extensive past medical history including schizophrenia, hypertension, CHF, diabetes, stroke, and end-stage renal disease on hemodialysis. [name] currently admitted for Schizoaffective disorder [facility]) [F25.9]   End-stage renal disease on hemodialysis. Patient received dialysis on Saturday, but terminated treatment after 2.5 hrs. Will receive dialysis today  2. Anemia of chronic kidney disease Lab Results  Component Value Date   HGB 9.0 (L) 11/11/2022    Hgb borderline. Will order low dose EPO with dialysis treatments.   3. Secondary Hyperparathyroidism: with outpatient labs: none available Lab Results  Component Value Date   PTH 57 02/18/2020   CALCIUM 9.0 11/11/2022   CAION 1.16 06/02/2022   PHOS 5.0 (H) 06/23/2022    Will continue to monitor bone minerals. Currently acceptable. Continue calcitriol  4.  Hypertension with chronic kidney disease.  Home regimen includes amlodipine, carvedilol, and torsemide.  Currently receiving these medications.    Blood pressure stable for this patient    LOS: 3 Tiffany [name] 10/28/202412:56 PM

## 2022-11-13 NOTE — Group Note (Signed)
Date:  11/13/2022 Time:  4:34 PM  Group Topic/Focus:  Dimensions of Wellness:   The focus of this group is to introduce the topic of wellness and discuss the role each dimension of wellness plays in total health.    Participation Level:  ACTIVE  Participation Quality:  Appropriate  Affect:  Appropriate  Cognitive:  Appropriate  Insight: Appropriate  Engagement in Group:  Engaged  Modes of Intervention:  Activity  Additional Comments:    Rishith Siddoway 11/13/2022, 4:34 PM

## 2022-11-13 NOTE — Progress Notes (Signed)
Patient refused to be assessed sacrum blanched area stated " I have been in and out of the hospital I don't need anyone to do anything for me. Support provided to Patient and education Patient none compliant. Encouragement ongoing.

## 2022-11-13 NOTE — Plan of Care (Signed)
  Problem: Education: Goal: Verbalization of understanding the information provided will improve Outcome: Progressing   Problem: Activity: Goal: Interest or engagement in activities will improve Outcome: Progressing  Patient refused HS Zyprexa and Prazosin. Patient educated on medications. Provider notified no new orders. Support and encouragement provided. 1:1 sitter by bedside Patient remains safe.

## 2022-11-13 NOTE — Progress Notes (Signed)
This RN is going over her medications and patient states, "no I don't take any of those medications, I don't take prazosin, I dont take zyprexa, that is what brought me in here." Patient voice started to elevate. This RN attempted to de-escalate the situation, but patient continues to talk about "I haven't seen the doctor all day, and I havent gotten any of my medication." Nurse observed patient fidgeting her hands on the tables.

## 2022-11-13 NOTE — Consult Note (Signed)
WOC Nurse Consult Note: Reason for Consult: Admitted with nonintact lesion to buttocks and sacral area.  Recent change in mental health with exacerbating symptoms of depression and hostility.  Is refusing assessment to this area.  Will likely receive dialysis today which will include sitting for long periods of time. Unclear if patient is well enough to perform her own hygiene at this time.  She does at baseline.   Wound type: pressure (unstageable)  vs incontinence (moisture) associated skin breakdown to sacrum and upper buttocks.   Pressure Injury POA: Yes Measurement: refusing assessment Wound bed: noted yellow wound bed via chart review Drainage (amount, consistency, odor) unknown Periwound: intact Dressing procedure/placement/frequency: Cleanse wound to buttocks/sacral area with AM bathing and care.  Apply silicone foam dressing to pad and protect due to sitting for long periods in dialysis and change in self care practices. Change every other day and PRN soilage.  Will not follow at this time.  Please re-consult if needed.  Mike Gip MSN, RN, FNP-BC CWON Wound, Ostomy, Continence Nurse Outpatient [facility] [phone] Pager [phone]

## 2022-11-13 NOTE — Discharge Planning (Signed)
ESTABLISHED HEMODIALYSIS Outpatient Facility [facility] [address] [place], [place] [address] [phone]  Schedule: MWF 10:30am  Confirmed with clinic that patient is active and can resume schedule upon discharge.  Dimas Chyle Dialysis Coordinator II  [facility] Cell: [phone] eFax: [phone] [email or web address]

## 2022-11-13 NOTE — Progress Notes (Signed)
Hemodialysis note  Received patient in recliner to unit. Alert and oriented.  Informed consent signed and in chart.  Treatment initiated: 1355 Treatment completed: 1744  Patient tolerated well. Transported back to room, alert without acute distress.  Report given to patient's RN.   Access used: Right Chest HD Catheter Access issues: none  Total UF removed: 1L Medication(s) given:  Labetalol 5mg  IV, Epogen 4000 units IV  Post HD weight: unable to obtain. Patient feels weak to stand up.   [name]

## 2022-11-13 NOTE — Progress Notes (Signed)
Patient still in dayroom states, "I still cannot go to sleep, my mind is racing, and I'm anxious"

## 2022-11-13 NOTE — Progress Notes (Signed)
Patient sitting in the dayroom and states, "I haven't had any of medications today, my legs hurt I'm irritated, I need something I need some medication."

## 2022-11-13 NOTE — Progress Notes (Signed)
[facility] MD Progress Note  11/13/2022 12:05 PM Tiffany [name]  MRN:  [id number] Subjective: Patient seen on rounds.  She continues to be demanding.  It seems pretty apparent in talking with her and reading through her chart that she has cluster B traits.  She was recently started on dialysis.  She has multiple medical problems.  She currently denies any suicidal ideation.  [name] easily agitated.  She required some as needed medication last night.  She has been not cooperative with wound care.  She was upset last night because the channel was changed on the TV.  Not really sure why she was sent to [name], except for the fact that she ends up in the emergency room a lot.  She does not have any outpatient psychiatric provider. Principal Problem: Schizoaffective disorder (HCC) Diagnosis: Principal Problem:   Schizoaffective disorder (HCC)  Total Time spent with patient: 15 minutes  Past Psychiatric History: She has been diagnosed with schizoaffective disorder but there is no evidence of psychosis and she comes across more as cluster B traits.  Past Medical History:  Past Medical History:  Diagnosis Date   Abdominal pain    Acute encephalopathy 11/21/2017   Acute exacerbation of CHF (congestive heart failure) (HCC) 03/03/2022   Acute GI bleeding 03/29/2021   Acute kidney injury superimposed on chronic kidney disease (HCC) 02/18/2020   Acute metabolic encephalopathy 02/20/2020   Acute on chronic diastolic heart failure (HCC) 02/04/2022   Acute on chronic renal failure (HCC) 06/02/2022   Aggressive behavior    Agitation 11/22/2017   Anoxic brain injury (HCC) 09/08/2016   C. Arrest due to respiratory failure and COPD exacerbation   Anxiety    Arthritis    "all over" (04/10/2016)   Asthma 10/18/2010   Binge eating disorder    Blood loss anemia 04/24/2011   CAP (community acquired pneumonia) 06/22/2015   Cardiac arrest (HCC) 09/08/2016   PEA   Carotid artery stenosis    1-39% bilateral by  dopplers 11/2016   Chronic diastolic (congestive) heart failure (HCC)    Chronic kidney disease, stage 3 (HCC)    Chronic kidney disease, stage 3b (HCC) 06/06/2021   Chronic pain syndrome 06/18/2012   Chronic post-traumatic stress disorder (PTSD) 05/27/2018   Chronic respiratory failure with hypoxia and hypercapnia (HCC) 06/22/2015   TRILOGY Vent >AVAPA-ES., Vt target 200-400, Max P 30 , PS max 20 , PS min 6-10 , E Max 6, E Min 4, Rate Auto AVAPS Rate 2 (titrate for pt comfort) , bleed O2 at 5l/m continuous flow .    CKD (chronic kidney disease) 02/03/2022   Closed displaced fracture of fifth metacarpal bone 03/21/2018   Cocaine use disorder, severe, in sustained remission (HCC) 12/17/2015   Complication of anesthesia    decreased bp, decreased heart rate   COPD (chronic obstructive pulmonary disease) (HCC) 07/08/2014   Delusional disorder, persecutory type (HCC) 06/26/2021   Depression    Diabetic neuropathy (HCC) 04/24/2011   Difficulty with speech 01/24/2018   Disorder of nervous system    Drug abuse (HCC) 11/21/2017   Dyslipidemia 04/24/2011   E. coli UTI 02/20/2020   Elevated troponin 04/28/2012   Emphysema    Encephalopathy 11/21/2017   Essential hypertension 03/22/2016   Facet arthropathy, lumbar 06/12/2022   Fibula fracture 07/10/2016   Frequent falls 10/11/2017   GERD (gastroesophageal reflux disease)    GI bleed 03/30/2021   Gout 04/11/2017   Heart attack (HCC) 1980s   Hematochezia  History of blood transfusion 1994   "couldn't stop bleeding from my period"   History of drug abuse in remission (HCC) 11/28/2015   Quit in 2017   Hyperlipidemia 03/31/2021   Hyperlipidemia LDL goal <70    Hypocalcemia    Hypokalemia    Hypomagnesemia    Incontinence    Manic depression (HCC)    Morbid obesity (HCC) 10/18/2010   Obstructive sleep apnea 10/18/2010   On home oxygen therapy    "6L; 24/7" (04/10/2016)   OSA on CPAP    "wear mask sometimes" (04/10/2016)   Painless  rectal bleeding 12/31/2015   Paranoid (HCC)    "sometimes; I'm on RX for it" (04/10/2016)   Periumbilical abdominal pain    Prolonged Q-T interval on ECG    Rectal bleeding 12/31/2015   Recurrent syncope 07/09/2021   Rhabdomyolysis 06/16/2021   Schizoaffective disorder, bipolar type (HCC) 04/05/2018   Seasonal allergies    Seborrheic keratoses 12/31/2013   Seizures (HCC)    "don't know what kind; last one was ~ 1 yr ago" (04/10/2016)   Sinus bradycardia    Skin ulcer of sacrum, limited to breakdown of skin (HCC) 03/08/2022   Spondylolisthesis at L3-L4 level 06/12/2022   Stroke (HCC) 1980s   denies residual on 04/10/2016   Thrush 09/19/2013   Type 2 diabetes mellitus (HCC) 10/18/2010    Past Surgical History:  Procedure Laterality Date   CESAREAN SECTION  1997   COLONOSCOPY WITH PROPOFOL N/A 04/01/2021   Procedure: COLONOSCOPY WITH PROPOFOL;  Surgeon: Jeani Hawking, MD;  Location: [facility] ENDOSCOPY;  Service: Gastroenterology;  Laterality: N/A;  Rectal bleeding with drop in hemoglobin to 7.2 g/dL   HERNIA REPAIR     IR CHOLANGIOGRAM EXISTING TUBE  07/20/2016   IR PERC CHOLECYSTOSTOMY  05/10/2016   IR RADIOLOGIST EVAL & MGMT  06/08/2016   IR RADIOLOGIST EVAL & MGMT  06/29/2016   IR SINUS/FIST TUBE CHK-NON GI  07/12/2016   RIGHT/LEFT HEART CATH AND CORONARY ANGIOGRAPHY N/A 06/19/2017   Procedure: RIGHT/LEFT HEART CATH AND CORONARY ANGIOGRAPHY;  Surgeon: Dolores Patty, MD;  Location: [facility] INVASIVE CV LAB;  Service: Cardiovascular;  Laterality: N/A;   TIBIA IM NAIL INSERTION Right 07/12/2016   Procedure: INTRAMEDULLARY (IM) NAIL RIGHT TIBIA;  Surgeon: Tarry Kos, MD;  Location: [facility] OR;  Service: Orthopedics;  Laterality: Right;   UMBILICAL HERNIA REPAIR  ~ 1963   "that's why I don't have a belly button"   VAGINAL HYSTERECTOMY     Family History:  Family History  Problem Relation Age of Onset   Cancer Mother        lung   Depression Mother    Cancer Father        prostate   Depression  Sister    Anxiety disorder Sister    Schizophrenia Sister    Bipolar disorder Sister    Depression Sister    Depression Brother    Heart failure Other        cousin   Family Psychiatric  History: Unremarkable Social History:  Social History   Substance and Sexual Activity  Alcohol Use No   Alcohol/week: 0.0 standard drinks of alcohol     Social History   Substance and Sexual Activity  Drug Use Not Currently   Types: Cocaine   Comment: 04/10/2016 "last used cocaine back in November 2017"    Social History   Socioeconomic History   Marital status: Widowed    Spouse name: Not on file  Number of children: 3   Years of education: 12th   Highest education level: 12th grade  Occupational History   Occupation: disabled    Comment: factory production  Tobacco Use   Smoking status: Former    Current packs/day: 0.00    Average packs/day: 1.5 packs/day for 39.1 years (58.6 ttl pk-yrs)    Types: Cigarettes    Start date: 03/13/1977    Quit date: 04/10/2016    Years since quitting: 6.5    Passive exposure: Past   Smokeless tobacco: Never  Vaping Use   Vaping status: Never Used  Substance and Sexual Activity   Alcohol use: No    Alcohol/week: 0.0 standard drinks of alcohol   Drug use: Not Currently    Types: Cocaine    Comment: 04/10/2016 "last used cocaine back in November 2017"   Sexual activity: Not Currently    Birth control/protection: Surgical  Other Topics Concern   Not on file  Social History Narrative   Has 1 son, [name]   Lives with son and his boyfriend   Her house has ramps and handrails should she ever needs them.    Her mother lives down the street from her and is a good support person in addition to her son.   She drives herself, has private transportation.    Cocaine free since 02/24/16, smoke free since 04/10/16   Social Determinants of Health   Financial Resource Strain: Low Risk  (07/11/2022)   Overall Financial Resource Strain (CARDIA)    Difficulty  of Paying Living Expenses: Not very hard  Food Insecurity: Food Insecurity Present (11/10/2022)   Hunger Vital Sign    Worried About Running Out of Food in the Last Year: Sometimes true    Ran Out of Food in the Last Year: Sometimes true  Transportation Needs: No Transportation Needs (11/10/2022)   PRAPARE - Administrator, Civil Service (Medical): No    Lack of Transportation (Non-Medical): No  Physical Activity: Inactive (07/11/2022)   Exercise Vital Sign    Days of Exercise per Week: 0 days    Minutes of Exercise per Session: 0 min  Stress: Stress Concern Present (07/11/2022)   [employer] of Occupational Health - Occupational Stress Questionnaire    Feeling of Stress : To some extent  Social Connections: Socially Isolated (07/11/2022)   Social Connection and Isolation Panel [NHANES]    Frequency of Communication with Friends and Family: Three times a week    Frequency of Social Gatherings with Friends and Family: Three times a week    Attends Religious Services: Never    Active Member of Clubs or Organizations: No    Attends Banker Meetings: Never    Marital Status: Widowed   Additional Social History:                         Sleep: Fair  Appetite:  Good  Current Medications: Current Facility-Administered Medications  Medication Dose Route Frequency Provider Last Rate Last Admin   acetaminophen (TYLENOL) tablet 650 mg  650 mg Oral Q6H PRN Bing Neighbors, NP   650 mg at 11/12/22 2140   albuterol (VENTOLIN HFA) 108 (90 Base) MCG/ACT inhaler 2 puff  2 puff Inhalation Q6H PRN Bing Neighbors, NP       alteplase (CATHFLO ACTIVASE) injection 2 mg  2 mg Intracatheter Once PRN Wendee Beavers, NP       amLODipine (NORVASC) tablet 10 mg  10 mg Oral Daily Bing Neighbors, NP   10 mg at 11/12/22 1191   asenapine (SAPHRIS) sublingual tablet 5 mg  5 mg Sublingual BID Bing Neighbors, NP   5 mg at 11/12/22 2135   atorvastatin  (LIPITOR) tablet 20 mg  20 mg Oral Daily Bing Neighbors, NP   20 mg at 11/12/22 4782   calcitRIOL (ROCALTROL) capsule 0.5 mcg  0.5 mcg Oral Daily Bing Neighbors, NP   0.5 mcg at 11/12/22 0843   carvedilol (COREG) tablet 12.5 mg  12.5 mg Oral BID WC Bing Neighbors, NP   12.5 mg at 11/12/22 1736   Chlorhexidine Gluconate Cloth 2 % PADS 6 each  6 each Topical Q0600 Wendee Beavers, NP       cyclobenzaprine (FLEXERIL) tablet 10 mg  10 mg Oral TID Sarina Ill, DO   10 mg at 11/12/22 2135   dicyclomine (BENTYL) capsule 10 mg  10 mg Oral TID Deneen Harts, NP   10 mg at 11/12/22 1740   diphenhydrAMINE (BENADRYL) capsule 50 mg  50 mg Oral TID PRN Bing Neighbors, NP   50 mg at 11/13/22 0049   Or   diphenhydrAMINE (BENADRYL) injection 50 mg  50 mg Intramuscular TID PRN Bing Neighbors, NP       DULoxetine (CYMBALTA) DR capsule 30 mg  30 mg Oral Daily Bing Neighbors, NP   30 mg at 11/12/22 0844   escitalopram (LEXAPRO) tablet 20 mg  20 mg Oral Daily Bing Neighbors, NP   20 mg at 11/12/22 0844   ferrous sulfate tablet 325 mg  325 mg Oral [place] Pink, NP   325 mg at 11/11/22 0903   fluticasone furoate-vilanterol (BREO ELLIPTA) 100-25 MCG/ACT 1 puff  1 puff Inhalation Daily Bing Neighbors, NP   1 puff at 11/12/22 0845   And   umeclidinium bromide (INCRUSE ELLIPTA) 62.5 MCG/ACT 1 puff  1 puff Inhalation Daily Bing Neighbors, NP   1 puff at 11/12/22 0845   folic acid (FOLVITE) tablet 1 mg  1 mg Oral Daily Bing Neighbors, NP   1 mg at 11/12/22 0844   haloperidol (HALDOL) tablet 5 mg  5 mg Oral TID PRN Bing Neighbors, NP   5 mg at 11/13/22 9562   Or   haloperidol lactate (HALDOL) injection 5 mg  5 mg Intramuscular TID PRN Bing Neighbors, NP       heparin injection 1,000 Units  1,000 Units Intracatheter PRN Wendee Beavers, NP       hydrOXYzine (ATARAX) tablet 25 mg  25 mg Oral TID PRN Bing Neighbors, NP   25 mg at 11/12/22 2135    influenza vac split trivalent PF (FLULAVAL) injection 0.5 mL  0.5 mL Intramuscular Tomorrow-1000 Sarina Ill, DO       lamoTRIgine (LAMICTAL) tablet 100 mg  100 mg Oral Daily Bing Neighbors, NP   100 mg at 11/12/22 0845   LORazepam (ATIVAN) tablet 2 mg  2 mg Oral TID PRN Bing Neighbors, NP   2 mg at 11/13/22 1308   Or   LORazepam (ATIVAN) injection 2 mg  2 mg Intramuscular TID PRN Bing Neighbors, NP       OLANZapine (ZYPREXA) tablet 10 mg  10 mg Oral QHS Bing Neighbors, NP       pantoprazole (PROTONIX) EC tablet 80 mg  80 mg Oral Daily Bing Neighbors, NP  80 mg at 11/12/22 0844   prazosin (MINIPRESS) capsule 2 mg  2 mg Oral QHS Bing Neighbors, NP       torsemide [facility]) tablet 20 mg  20 mg Oral BID Bing Neighbors, NP   20 mg at 11/12/22 1736    Lab Results: No results found. However, due to the size of the patient record, not all encounters were searched. Please check Results Review for a complete set of results.  Blood Alcohol level:  Lab Results  Component Value Date   ETH <10 11/10/2022   ETH <10 06/21/2022    Metabolic Disorder Labs: Lab Results  Component Value Date   HGBA1C 5.0 01/05/2022   MPG 99.67 06/17/2021   MPG 105.41 07/23/2020   No results found for: "PROLACTIN" Lab Results  Component Value Date   CHOL 127 08/05/2018   TRIG 53 08/05/2018   HDL 62 08/05/2018   CHOLHDL 2.0 08/05/2018   VLDL 38 01/07/2017   LDLCALC 54 08/05/2018   LDLCALC 56 01/07/2017    Physical Findings: AIMS:  , ,  ,  ,    CIWA:    COWS:     Musculoskeletal: Strength & Muscle Tone: within normal limits Gait & Station: unable to stand Patient leans: N/A  Psychiatric Specialty Exam:  Presentation  General Appearance: No data recorded Eye Contact:No data recorded Speech:No data recorded Speech Volume:No data recorded Handedness:No data recorded  Mood and Affect  Mood:No data recorded Affect:No data recorded  Thought Process   Thought Processes:No data recorded Descriptions of Associations:No data recorded Orientation:No data recorded Thought Content:No data recorded History of Schizophrenia/Schizoaffective disorder:Yes  Duration of Psychotic Symptoms:Less than six months  Hallucinations:No data recorded Ideas of Reference:No data recorded Suicidal Thoughts:No data recorded Homicidal Thoughts:No data recorded  Sensorium  Memory:No data recorded Judgment:No data recorded Insight:No data recorded  Executive Functions  Concentration:No data recorded Attention Span:No data recorded Recall:No data recorded Fund of Knowledge:No data recorded Language:No data recorded  Psychomotor Activity  Psychomotor Activity:No data recorded  Assets  Assets:No data recorded  Sleep  Sleep:No data recorded   MENTAL STATUS EXAM: Patient is alert and oriented x 3, irritable, good eye contact, speech is normal and not pressured, mood is irritable; affect is congruent; thought process: goal directed; thought content: She denies suicidal ideation; judgment is poor, insight is poor. Blood pressure (!) 146/72, pulse 73, temperature 98.5 F (36.9 C), resp. rate 18, height 5\' 3"  (1.6 m), weight 88.5 kg, SpO2 100%. Body mass index is 34.54 kg/m.   Treatment Plan Summary: Daily contact with patient to assess and evaluate symptoms and progress in treatment, Medication management, and Plan continue current medication.  I have made no changes.  Sarina Ill, DO 11/13/2022, 12:05 PM

## 2022-11-13 NOTE — Group Note (Signed)
Recreation Therapy Group Note   Group Topic:Communication  Group Date: 11/13/2022 Start Time: 1400 End Time: 1455 Facilitators: Rosina Lowenstein, LRT, CTRS Location: Courtyard  Group Description: Emotional Check in. Patient sat and talked with LRT about how they are doing and whatever else is on their mind. LRT provided active listening, reassurance and encouragement. Pts were given the opportunity to listen to music or play cornhole while getting fresh air and sunlight in the courtyard.    Goal Area(s) Addressed: Patient will engage in conversation with LRT. Patient will communicate their wants, needs, or questions.  Patient will practice a new coping skill of "talking to someone".  Affect/Mood: N/A   Participation Level: Did not attend    Clinical Observations/Individualized Feedback: Tiffany [name] did not attend group.   Plan: Continue to engage patient in RT group sessions 2-3x/week.   [address], LRT, CTRS 11/13/2022 3:24 PM

## 2022-11-13 NOTE — Progress Notes (Signed)
1500: Patient is complaining of chest pain 8/10. UF Paused and BFR lowered to 300. Seen by Dr. Wynelle Link.  1512: Verbalizes relief of chest pain. Per patient she is having abdominal pain and feels like she needs to have a bowel movement.

## 2022-11-14 DIAGNOSIS — F251 Schizoaffective disorder, depressive type: Secondary | ICD-10-CM

## 2022-11-14 DIAGNOSIS — F4310 Post-traumatic stress disorder, unspecified: Secondary | ICD-10-CM

## 2022-11-14 LAB — LIPID PANEL
Cholesterol: 151 mg/dL (ref 0–200)
HDL: 65 mg/dL (ref >40)
LDL Cholesterol: 66 mg/dL (ref 0–99)
Total CHOL/HDL Ratio: 2.3 {ratio}
Triglycerides: 100 mg/dL (ref <150)
VLDL: 20 mg/dL (ref 0–40)

## 2022-11-14 LAB — HEPATITIS B SURFACE ANTIBODY, QUANTITATIVE: Hep B S AB Quant (Post): 10.4 m[IU]/mL

## 2022-11-14 NOTE — Group Note (Signed)
Date:  11/14/2022 Time:  10:22 PM  Group Topic/Focus:  Goals Group:   The focus of this group is to help patients establish daily goals to achieve during treatment and discuss how the patient can incorporate goal setting into their daily lives to aide in recovery. Wrap-Up Group:   The focus of this group is to help patients review their daily goal of treatment and discuss progress on daily workbooks.    Participation Level:  Active  Participation Quality:  Appropriate  Affect:  Appropriate  Cognitive:  Appropriate  Insight: Appropriate  Engagement in Group:  Engaged  Modes of Intervention:  Discussion  Additional Comments:    Tiffany [name] 11/14/2022, 10:22 PM

## 2022-11-14 NOTE — Progress Notes (Signed)
   11/14/22 0813  Psych Admission Type (Psych Patients Only)  Admission Status Voluntary  Psychosocial Assessment  Patient Complaints Anger  Eye Contact Fair  Facial Expression Flat  Affect Apprehensive  Speech Logical/coherent  Interaction Arrogant  Motor Activity Slow  Appearance/Hygiene In scrubs  Behavior Characteristics Calm  Mood Anxious  Thought Process  Coherency WDL  Content WDL  Delusions None reported or observed  Perception WDL  Hallucination None reported or observed  Judgment Impaired  Confusion None  Danger to Self  Current suicidal ideation? Denies  Danger to Others  Danger to Others None reported or observed  Danger to Others Abnormal  Harmful Behavior to others No threats or harm toward other people

## 2022-11-14 NOTE — Progress Notes (Signed)
   11/14/22 2100  Psych Admission Type (Psych Patients Only)  Admission Status Voluntary  Psychosocial Assessment  Patient Complaints Irritability;Anxiety  Eye Contact Fair  Facial Expression Flat  Affect Irritable  Speech Loud  Interaction Arrogant  Motor Activity Slow  Appearance/Hygiene In scrubs  Behavior Characteristics Irritable  Mood Irritable  Thought Process  Coherency WDL  Content WDL  Delusions None reported or observed  Perception WDL  Hallucination None reported or observed  Judgment Impaired  Confusion None  Danger to Self  Current suicidal ideation? Denies  Danger to Others  Danger to Others None reported or observed  Danger to Others Abnormal  Harmful Behavior to others No threats or harm toward other people  Destructive Behavior No threats or harm toward property

## 2022-11-14 NOTE — Progress Notes (Signed)
Patient waved RN to come and talk to her in her room. RN rolled patient into the room on chair to speak with her. Patient begins to rapidly speaking about "I never feel heard by the doctors or nobody, I talk to the doctors today and they are still giving me shit that doesn't help me or that I do not want? Patient ask why. Nurse provides education on the medication and why the medication is ordered, RN instructs patient to discuss with provider about her concerns about her medications. Patient loudly states, "that is what I have been doing but that lady doctor wont do anything for me." "I don't want Zyprexa or prazosin that is why I'm in here in the first place don't you understand." RN attempts to de-escalates the situation. Patient provided with supportive listening and encouragement.

## 2022-11-14 NOTE — Progress Notes (Signed)
   11/14/22 1400  Spiritual Encounters  Type of Visit Initial  Care provided to: Patient  Referral source Chaplain assessment  Reason for visit Routine spiritual support  OnCall Visit No  Spiritual Framework  Presenting Themes Values and beliefs;Impactful experiences and emotions;Courage hope and growth;Community and relationships  Community/Connection Family  Patient Stress Factors Family relationships  Family Stress Factors Family relationships  Interventions  Spiritual Care Interventions Made Encouragement;Reflective listening;Compassionate presence;Established relationship of care and support  Intervention Outcomes  Outcomes Autonomy/agency;Awareness of health;Awareness of support  Spiritual Care Plan  Spiritual Care Issues Still Outstanding Chaplain will continue to follow   Chaplain met with patient in dayroom. Patient told chaplain that she came in because she has been feeling overwhelmed and anxious. Patient shared that familial relationships are a big factor in her stress. Patient also stated that she lives in a house that is not handicap accessible and that she is hoping she will be able to find something else soon. Chaplain listened to her and offered her words of encouragement. Chaplain will continue to follow up with patient and her progress.

## 2022-11-14 NOTE — Group Note (Signed)
Date:  11/14/2022 Time:  11:32 AM  Group Topic/Focus:  Goals Group/Karaoke   The focus of this group is to help patients establish daily goals to achieve during treatment and discuss how the patient can incorporate goal setting into their daily lives to aide in recovery. Also allowing patients to sing along to their favorite songs in [place]     Participation Level:  Minimal  Participation Quality:  Drowsy  Affect:  Appropriate  Cognitive:  Appropriate  Insight: Good  Engagement in Group:  Lacking and Limited  Modes of Intervention:  Activity and Discussion  Additional Comments:    Marta Antu 11/14/2022, 11:32 AM

## 2022-11-14 NOTE — Group Note (Signed)
Date:  11/14/2022 Time:  2:32 AM  Group Topic/Focus:  Wellness Toolbox:   The focus of this group is to discuss various aspects of wellness, balancing those aspects and exploring ways to increase the ability to experience wellness.  Patients will create a wellness toolbox for use upon discharge.    Participation Level:  Active  Participation Quality:  Appropriate  Affect:  Appropriate  Cognitive:  Appropriate  Insight: Appropriate  Engagement in Group:  Engaged  Modes of Intervention:  Discussion  Additional Comments:    Maeola Harman 11/14/2022, 2:32 AM

## 2022-11-14 NOTE — Group Note (Signed)
Recreation Therapy Group Note   Group Topic:General Recreation  Group Date: 11/14/2022 Start Time: 1400 End Time: 1500 Facilitators: Rosina Lowenstein, LRT, CTRS Location:  Day Room  Group Description: Bingo. LRT and patients played multiple games of Bingo with music playing in the background. LRT and pts discussed how this could be a leisure interest and the importance of doing things they enjoy post-discharge. Pts won stress balls and [employer] as Chief Financial Officer. LRT gave pts the opportunity to go outside to the courtyard for fresh air and sunlight after.   Goal Area(s) Addressed: Patient will identify leisure interests.  Patient will practice healthy decision making. Patient will engage in recreation activity.  Patient will increase communication.    Affect/Mood: Appropriate   Participation Level: Active and Engaged   Participation Quality: Independent   Behavior: Calm and Cooperative   Speech/Thought Process: Coherent   Insight: Good   Judgement: Good   Modes of Intervention: Activity   Patient Response to Interventions:  Attentive, Engaged, Interested , and Receptive   Education Outcome:  Acknowledges education   Clinical Observations/Individualized Feedback: Tiffany [name] was active in their participation of session activities and group discussion. Pt won a Facilities manager and chose a stress ball as a prize. Pt interacted well with LRT and peers duration of session.    Plan: Continue to engage patient in RT group sessions 2-3x/week.   [id number] LRT, CTRS 11/14/2022 3:21 PM

## 2022-11-14 NOTE — Progress Notes (Cosign Needed)
Physical Therapy Treatment Patient Details Name: Tiffany [name] MRN: [id number] DOB: 1960-12-15 Today's Date: 11/14/2022   History of Present Illness Tiffany [name] is a 62 year old African-American female who is voluntarily admitted to inpatient psychiatry for depression and agitation.  She had a visit from her family and became very disturbed and called 911 and she was brought to [facility].with complex history of multiple medical problems including HTN, ESRD recently started HD MWF.  Other problems include congestive heart failure, diabetes, COPD, polysubstance abuse, stroke, and malingering.    PT Comments  Pt reports living alone with no help. She stands to RW with min/mod a x 1 due to post LOB's and assist needed to prevent fall.  Session focuses on multiple transfers bed <-> chair and chair <-> commode to void.  Pt uses RW and varied assist from min -> mod due to frequent post LOB's and poor step quality often leaning elbows on walker vs standing upright.  She does do her own self care but when attempting to pull up/down her clothing has significant post LOB.    Initial recommendations for HHPT but given mobility, new dialysis and balance impairments will change to SNF.  If pt has appropriate help at home for all transfers and mobility, discharge home with proper equipment would be feasible.  She stated she does not have a wheelchair and one would be helpful as she is unable to walk household distances at this time.  Patient suffers from general weakness  which impairs his/her ability to perform daily activities like toileting, feeding, dressing, grooming, bathing in the home. A cane, walker, crutch will not resolve the patient's issue with performing activities of daily living. A lightweight wheelchair and cushion is required/recommended and will allow patient to safely perform daily activities.   Patient can safely propel the wheelchair in the home or has a caregiver who can provide assistance.      If plan is discharge home, recommend the following: A little help with walking and/or transfers;Direct supervision/assist for medications management;Supervision due to cognitive status;Assist for transportation;Help with stairs or ramp for entrance;A little help with bathing/dressing/bathroom   Can travel by private vehicle        Equipment Recommendations  Rolling walker (2 wheels);BSC/3in1;Wheelchair (measurements PT);Wheelchair cushion (measurements PT)    Recommendations for Other Services       Precautions / Restrictions Precautions Precautions: Fall     Mobility  Bed Mobility Overal bed mobility: Needs Assistance               Patient Response: Cooperative  Transfers Overall transfer level: Needs assistance Equipment used: Rolling walker (2 wheels) Transfers: Bed to chair/wheelchair/BSC, Sit to/from Stand Sit to Stand: Min assist Stand pivot transfers: Min assist, Mod assist         General transfer comment: multiple transfers with RW varried with assist from min to mod due to post LOB    Ambulation/Gait Ambulation/Gait assistance: Min assist, Mod assist Gait Distance (Feet): 3 Feet Assistive device: Rolling walker (2 wheels) Gait Pattern/deviations: Step-to pattern Gait velocity: dec     General Gait Details: poor quality steps with post LOB's   Stairs             Wheelchair Mobility     Tilt Bed Tilt Bed Patient Response: Cooperative  Modified Rankin (Stroke Patients Only)       Balance Overall balance assessment: Needs assistance Sitting-balance support: Feet supported Sitting balance-Leahy Scale: Fair     Standing balance support: Bilateral upper  extremity supported Standing balance-Leahy Scale: Poor Standing balance comment: +1 hands on assist for post LOB's                            Cognition Arousal: Alert Behavior During Therapy: WFL for tasks assessed/performed Overall Cognitive Status: History of  cognitive impairments - at baseline                                          Exercises Other Exercises Other Exercises: transfers to commode to void, max a for clothing management but does do her own self care    General Comments        Pertinent Vitals/Pain Pain Assessment Pain Assessment: No/denies pain    Home Living                          Prior Function            PT Goals (current goals can now be found in the care plan section) Progress towards PT goals: Progressing toward goals    Frequency    Min 1X/week      PT Plan      Co-evaluation              AM-PAC PT "6 Clicks" Mobility   Outcome Measure  Help needed turning from your back to your side while in a flat bed without using bedrails?: None Help needed moving from lying on your back to sitting on the side of a flat bed without using bedrails?: A Little Help needed moving to and from a bed to a chair (including a wheelchair)?: A Little Help needed standing up from a chair using your arms (e.g., wheelchair or bedside chair)?: A Little Help needed to walk in hospital room?: A Lot Help needed climbing 3-5 steps with a railing? : Total 6 Click Score: 16    End of Session Equipment Utilized During Treatment: Gait belt;Oxygen Activity Tolerance: Patient tolerated treatment well Patient left: in chair;with nursing/sitter in room (in day room) Nurse Communication: Mobility status PT Visit Diagnosis: Difficulty in walking, not elsewhere classified (R26.2)     Time: 1610-9604 PT Time Calculation (min) (ACUTE ONLY): 20 min  Charges:    $Therapeutic Activity: 23-37 mins PT General Charges $$ ACUTE PT VISIT: 1 Visit                   Danielle Dess, PTA 11/14/22, 1:47 PM
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
## 2022-11-14 NOTE — Progress Notes (Signed)
   11/14/22 0540  15 Minute Checks  Location Bedroom  Visual Appearance Calm  Behavior Sleeping  Sleep (Behavioral Health Patients Only)  Calculate sleep? (Click Yes once per 24 hr at 0600 safety check) Yes  Documented sleep last 24 hours 6

## 2022-11-15 DIAGNOSIS — F4310 Post-traumatic stress disorder, unspecified: Secondary | ICD-10-CM | POA: Diagnosis not present

## 2022-11-15 DIAGNOSIS — F251 Schizoaffective disorder, depressive type: Secondary | ICD-10-CM | POA: Diagnosis not present

## 2022-11-15 MED ORDER — HEPARIN SODIUM (PORCINE) 1000 UNIT/ML IJ SOLN
INTRAMUSCULAR | Status: AC
  Filled 2022-11-15: qty 10

## 2022-11-15 MED ORDER — EPOETIN ALFA 4000 UNIT/ML IJ SOLN
INTRAMUSCULAR | Status: AC
  Filled 2022-11-15: qty 1

## 2022-11-15 NOTE — Progress Notes (Signed)
Received patient in bed to unit.    Informed consent signed and in chart.    TX duration: 3.5 hrs     Transported back to floor  Hand-off given to patient's nurse.   Access used:  rt chest cvc Access issues: cassette clotted and tx was restarted  Total UF removed: 1,100 mls Medication(s) given: Heparin 2.000 units Epogen 4,000 units Post HD weight: 84.3kg      Maple Hudson, RN Dialysis Unit

## 2022-11-15 NOTE — Progress Notes (Signed)
   11/15/22 4403  15 Minute Checks  Location Bedroom  Visual Appearance Calm  Behavior Sleeping  Sleep (Behavioral Health Patients Only)  Calculate sleep? (Click Yes once per 24 hr at 0600 safety check) Yes  Documented sleep last 24 hours 9

## 2022-11-15 NOTE — Progress Notes (Signed)
Patient denies SI/A/VH and endorses HI "towards my family" Patient stated she gets agitated if she doesn't get what she needs. Endorses anxiety and depression. Patient in dayroom . Refused HS Minipress. Prn Atarax 25 mg  given at 2228 for anxiety.  Support and encouragement provided. Q 15 minutes safety checks ongoing.

## 2022-11-15 NOTE — Group Note (Signed)
Recreation Therapy Group Note   Group Topic:Health and Wellness  Group Date: 11/15/2022 Start Time: 1400 End Time: 1445 Facilitators: Rosina Lowenstein, LRT, CTRS Location:  Courtyard  Group Description: Courtyard games. Patients were given the opportunity to play ring toss, play corn hole, or listen to music while sitting in the courtyard getting fresh air and sunlight. Pt identified and conversated about things they enjoy doing in their free time and how they can continue to do that outside of the hospital.  Goal Area(s) Addressed: Patient will engage in a recreation game. Patient will build frustration tolerance skills. Patient will practice making a positive decision. Patient will have the opportunity to try a new leisure activity.   Affect/Mood: Appropriate   Participation Level: Active and Engaged   Participation Quality: Independent   Behavior: Calm and Cooperative   Speech/Thought Process: Coherent   Insight: Good   Judgement: Good   Modes of Intervention: Activity   Patient Response to Interventions:  Attentive, Engaged, Interested , and Receptive   Education Outcome:  Acknowledges education   Clinical Observations/Individualized Feedback: Tiffany [name] was active in their participation of session activities and group discussion. Pt was doing gross words from her cross word puzzle book while outside. Pt was noted to be singing along to the songs being played. Pt interacted well with LRT and peers duration of session.    Plan: Continue to engage patient in RT group sessions 2-3x/week.   Rosina Lowenstein, LRT, CTRS 11/15/2022 3:08 PM

## 2022-11-15 NOTE — Progress Notes (Signed)
Patient is irritable, labile and verbally aggressive Patient stated " I am agitated I want my Ativan. Haldol and Benadryl those are the only medications that work for me not Minipress or Zyprexa. Patient was advised that Zyprexa was d/c. She started raising her voice loud "you all don't know what you are doing you are suppose to give me my heart medication it starts with a C and it's 12.5 mg". Patient advised that she was already scheduled for Coreg 12.5mg . She demanded to get the Haldol, Ativan and Benadryl. "You don't know what you are doing I want medications that work for me that's what I am suppose to take every night. I am not going to bed tonight. I don't want you to give me medications I want the other Nurse to give me medications. You asked to look at my wound on my butt the other day and I refused" Patient educated on medications. Support and encouragement provided. PO

## 2022-11-15 NOTE — Progress Notes (Signed)
[facility]  ROUNDING NOTE   Subjective:   Patient seen and evaluated during dialysis   HEMODIALYSIS FLOWSHEET:  Blood Flow Rate (mL/min): 400 mL/min Arterial Pressure (mmHg): -170 mmHg Venous Pressure (mmHg): 150 mmHg TMP (mmHg): 7 mmHg Ultrafiltration Rate (mL/min): 686 mL/min Dialysate Flow Rate (mL/min): 300 ml/min Dialysis Fluid Bolus: Normal Saline  Tolerating treatment well  Seated in chair  Objective:  Vital signs in last 24 hours:  Temp:  [98.3 F (36.8 C)-98.5 F (36.9 C)] 98.5 F (36.9 C) (10/30 0757) Pulse Rate:  [78-86] 86 (10/30 0910) Resp:  [14-18] 18 (10/30 0910) BP: (130-157)/(84-95) 130/85 (10/30 0910) SpO2:  [100 %] 100 % (10/30 0808) Weight:  [85.5 kg] 85.5 kg (10/30 0757)  Weight change:  Filed Weights   11/10/22 1512 11/13/22 1338 11/15/22 0757  Weight: 88.5 kg 75.8 kg 85.5 kg    Intake/Output: No intake/output data recorded.   Intake/Output this shift:  No intake/output data recorded.  Physical Exam: General: NAD, sitting in chair  Head: Normocephalic, atraumatic. Moist oral mucosal membranes  Eyes: Anicteric  Lungs:  Clear to auscultation, room air  Heart: Regular rate and rhythm  Abdomen:  Soft, nontender  Extremities:  trace peripheral edema.  Neurologic: Alert moving all four extremities  Skin: No lesions  Access: Rt chest Permcath    Basic Metabolic Panel: Recent Labs  Lab 11/10/22 0135 11/11/22 0825 11/13/22 1400  NA 136 139 134*  K 3.4* 3.5 3.6  CL 98 102 99  CO2 28 27 30   GLUCOSE 88 97 89  BUN 17 23 31*  CREATININE 3.83* 3.68* 3.70*  CALCIUM 9.4 9.0 9.0  PHOS  --   --  2.2*    Liver Function Tests: Recent Labs  Lab 11/10/22 0135 11/11/22 0825 11/13/22 1400  AST 18 23  --   ALT 11 15  --   ALKPHOS 113 120  --   BILITOT 0.4 0.7  --   PROT 7.1 6.8  --   ALBUMIN 3.8 3.5 3.3*   No results for input(s): "LIPASE", "AMYLASE" in the last 168 hours. No results for input(s): "AMMONIA" in the  last 168 hours.  CBC: Recent Labs  Lab 11/10/22 0135 11/11/22 0825 11/13/22 1400  WBC 8.0 7.4 6.4  HGB 8.2* 9.0* 8.0*  HCT 26.6* 29.3* 26.1*  MCV 92.0 93.0 93.2  PLT 274 317 317    Cardiac Enzymes: No results for input(s): "CKTOTAL", "CKMB", "CKMBINDEX", "TROPONINI" in the last 168 hours.  BNP: Invalid input(s): "POCBNP"  CBG: Recent Labs  Lab 11/11/22 0750 11/13/22 2040  GLUCAP 89 91    Microbiology: Results for orders placed or performed during the hospital encounter of 11/07/22  SARS Coronavirus 2 by RT PCR (hospital order, performed in [facility] hospital lab) *cepheid single result test* Anterior Nasal Swab     Status: None   Collection Time: 11/07/22  1:51 PM   Specimen: Anterior Nasal Swab  Result Value Ref Range Status   SARS Coronavirus 2 by RT PCR NEGATIVE NEGATIVE Final    Comment: Performed at [facility], [address], [place], [place] [address]   *Note: Due to a large number of results and/or encounters for the requested time period, some results have not been displayed. A complete set of results can be found in Results Review.    Coagulation Studies: No results for input(s): "LABPROT", "INR" in the last 72 hours.  Urinalysis: No results for input(s): "COLORURINE", "LABSPEC", "PHURINE", "GLUCOSEU", "HGBUR", "BILIRUBINUR", "KETONESUR", "PROTEINUR", "  UROBILINOGEN", "NITRITE", "LEUKOCYTESUR" in the last 72 hours.  Invalid input(s): "APPERANCEUR"    Imaging: No results found.   Medications:     amLODipine  10 mg Oral Daily   asenapine  5 mg Sublingual BID   atorvastatin  20 mg Oral Daily   calcitRIOL  0.5 mcg Oral Daily   carvedilol  12.5 mg Oral BID WC   Chlorhexidine Gluconate Cloth  6 each Topical Q0600   cyclobenzaprine  10 mg Oral TID   dicyclomine  10 mg Oral TID AC   DULoxetine  30 mg Oral Daily   epoetin (EPOGEN/PROCRIT) injection  4,000 Units Intravenous Q M,W,F-HD   escitalopram  20 mg Oral Daily   ferrous sulfate  325  mg Oral QODAY   fluticasone furoate-vilanterol  1 puff Inhalation Daily   And   umeclidinium bromide  1 puff Inhalation Daily   folic acid  1 mg Oral Daily   influenza vac split trivalent PF  0.5 mL Intramuscular Tomorrow-1000   lamoTRIgine  100 mg Oral Daily   OLANZapine  10 mg Oral QHS   pantoprazole  80 mg Oral Daily   prazosin  2 mg Oral QHS   torsemide  20 mg Oral BID   acetaminophen, albuterol, alteplase, alteplase, diphenhydrAMINE **OR** diphenhydrAMINE, haloperidol **OR** haloperidol lactate, heparin, heparin, hydrOXYzine, LORazepam **OR** LORazepam  Assessment/ Plan:  Tiffany [name] is a 62 y.o.  female  with an extensive past medical history including schizophrenia, hypertension, CHF, diabetes, stroke, and end-stage renal disease on hemodialysis. [name] currently admitted for Schizoaffective disorder [facility]) [F25.9]   End-stage renal disease on hemodialysis. Receiving dialysis today, UF goal 1-1.5L as tolerated. Next treatment scheduled for Friday.   2. Anemia of chronic kidney disease Lab Results  Component Value Date   HGB 8.0 (L) 11/13/2022    Hgb below desired range. Continue low dose EPO with dialysis treatments.   3. Secondary Hyperparathyroidism: with outpatient labs: none available Lab Results  Component Value Date   PTH 57 02/18/2020   CALCIUM 9.0 11/13/2022   CAION 1.16 06/02/2022   PHOS 2.2 (L) 11/13/2022    Hypophosphatemia noted. Will continue to monitor. Continue calcitriol  4.  Hypertension with chronic kidney disease.  Home regimen includes amlodipine, carvedilol, and torsemide.  Currently receiving these medications.    Blood pressure 138/72 during dialysis   LOS: 5 Tiffany [name] 10/30/20249:32 AM

## 2022-11-15 NOTE — Progress Notes (Signed)
D- Patient alert and oriented x4,  affect/mood and mood labile, tearful @ times, focused on getting better, and return home. Denies SI, HI, AVH, and c/o HA and requested Tylenol. Pt was dialyzed this am, mood seemed more improved and she was sociable with peers.- Scheduled medications administered to patient, am medications was administered upon return from dialysis per MD orders. Support and encouragement provided.  Routine safety checks conducted every 15 minutes.  Patient informed to notify staff with problems or concerns. R- No adverse drug reactions noted. Patient contracts for safety at this time. Patient compliant with medications and treatment plan. Patient receptive, calm, and cooperative and pleasant throughout the day. Patient remains safe at this time.

## 2022-11-15 NOTE — BH IP Treatment Plan (Signed)
Interdisciplinary Treatment and Diagnostic Plan Update  11/15/2022 Time of Session: 2:36 PM  Tiffany [name] MRN: [id number]  Principal Diagnosis: Schizoaffective disorder [facility])  Secondary Diagnoses: Principal Problem:   Schizoaffective disorder (HCC)   Current Medications:  Current Facility-Administered Medications  Medication Dose Route Frequency Provider Last Rate Last Admin   acetaminophen (TYLENOL) tablet 650 mg  650 mg Oral Q6H PRN Bing Neighbors, NP   650 mg at 11/15/22 0733   albuterol (VENTOLIN HFA) 108 (90 Base) MCG/ACT inhaler 2 puff  2 puff Inhalation Q6H PRN Bing Neighbors, NP       alteplase (CATHFLO ACTIVASE) injection 2 mg  2 mg Intracatheter Once PRN Wendee Beavers, NP       alteplase (CATHFLO ACTIVASE) injection 2 mg  2 mg Intracatheter Once PRN Wendee Beavers, NP       amLODipine (NORVASC) tablet 10 mg  10 mg Oral Daily Bing Neighbors, NP   10 mg at 11/15/22 1338   asenapine (SAPHRIS) sublingual tablet 5 mg  5 mg Sublingual BID Bing Neighbors, NP   5 mg at 11/14/22 2122   atorvastatin (LIPITOR) tablet 20 mg  20 mg Oral Daily Bing Neighbors, NP   20 mg at 11/15/22 1336   calcitRIOL (ROCALTROL) capsule 0.5 mcg  0.5 mcg Oral Daily Bing Neighbors, NP   0.5 mcg at 11/14/22 1026   carvedilol (COREG) tablet 12.5 mg  12.5 mg Oral BID WC Bing Neighbors, NP   12.5 mg at 11/15/22 1339   Chlorhexidine Gluconate Cloth 2 % PADS 6 each  6 each Topical Q0600 Wendee Beavers, NP       cyclobenzaprine (FLEXERIL) tablet 10 mg  10 mg Oral TID Sarina Ill, DO   10 mg at 11/15/22 1339   dicyclomine (BENTYL) capsule 10 mg  10 mg Oral TID Deneen Harts, NP   10 mg at 11/15/22 1339   diphenhydrAMINE (BENADRYL) capsule 50 mg  50 mg Oral TID PRN Bing Neighbors, NP   50 mg at 11/14/22 2121   Or   diphenhydrAMINE (BENADRYL) injection 50 mg  50 mg Intramuscular TID PRN Bing Neighbors, NP       DULoxetine (CYMBALTA) DR capsule 30 mg   30 mg Oral Daily Bing Neighbors, NP   30 mg at 11/15/22 1339   epoetin alfa (EPOGEN) injection 4,000 Units  4,000 Units Intravenous Q M,W,F-HD Wendee Beavers, NP   4,000 Units at 11/15/22 1021   escitalopram (LEXAPRO) tablet 20 mg  20 mg Oral Daily Bing Neighbors, NP   20 mg at 11/15/22 1337   ferrous sulfate tablet 325 mg  325 mg Oral [place] Pink, NP   325 mg at 11/11/22 0903   fluticasone furoate-vilanterol (BREO ELLIPTA) 100-25 MCG/ACT 1 puff  1 puff Inhalation Daily Bing Neighbors, NP   1 puff at 11/15/22 1356   And   umeclidinium bromide (INCRUSE ELLIPTA) 62.5 MCG/ACT 1 puff  1 puff Inhalation Daily Bing Neighbors, NP   1 puff at 11/15/22 1357   folic acid (FOLVITE) tablet 1 mg  1 mg Oral Daily Bing Neighbors, NP   1 mg at 11/15/22 1339   haloperidol (HALDOL) tablet 5 mg  5 mg Oral TID PRN Bing Neighbors, NP   5 mg at 11/14/22 2122   Or   haloperidol lactate (HALDOL) injection 5 mg  5 mg Intramuscular TID PRN Bing Neighbors, NP  heparin injection 1,000 Units  1,000 Units Intracatheter PRN Wendee Beavers, NP       heparin injection 1,000 Units  1,000 Units Intracatheter PRN Wendee Beavers, NP       hydrOXYzine (ATARAX) tablet 25 mg  25 mg Oral TID PRN Bing Neighbors, NP   25 mg at 11/13/22 2153   influenza vac split trivalent PF (FLULAVAL) injection 0.5 mL  0.5 mL Intramuscular Tomorrow-1000 Sarina Ill, DO       lamoTRIgine (LAMICTAL) tablet 100 mg  100 mg Oral Daily Bing Neighbors, NP   100 mg at 11/15/22 1340   LORazepam (ATIVAN) tablet 2 mg  2 mg Oral TID PRN Bing Neighbors, NP   2 mg at 11/14/22 2122   Or   LORazepam (ATIVAN) injection 2 mg  2 mg Intramuscular TID PRN Bing Neighbors, NP       OLANZapine (ZYPREXA) tablet 10 mg  10 mg Oral QHS Bing Neighbors, NP       pantoprazole (PROTONIX) EC tablet 80 mg  80 mg Oral Daily Bing Neighbors, NP   80 mg at 11/15/22 1337   prazosin (MINIPRESS) capsule  2 mg  2 mg Oral QHS Bing Neighbors, NP       torsemide [facility]) tablet 20 mg  20 mg Oral BID Bing Neighbors, NP   20 mg at 11/15/22 1338   PTA Medications: Medications Prior to Admission  Medication Sig Dispense Refill Last Dose   albuterol (VENTOLIN HFA) 108 (90 Base) MCG/ACT inhaler INHALE 2 PUFFS INTO THE LUNGS EVERY 6 HOURS AS NEEDED FOR WHEEZING OR SHORTNESS OF BREATH 8.5 g 2    amLODipine (NORVASC) 10 MG tablet Take 1 tablet (10 mg total) by mouth daily. 30 tablet 5    atorvastatin (LIPITOR) 20 MG tablet Take 1 tablet (20 mg total) by mouth daily. 30 tablet 5    calcitRIOL (ROCALTROL) 0.5 MCG capsule Take 1 capsule (0.5 mcg total) by mouth daily. 30 capsule 5    carvedilol (COREG) 12.5 MG tablet Take 1 tablet (12.5 mg total) by mouth 2 (two) times daily. 60 tablet 5    cyclobenzaprine (FLEXERIL) 10 MG tablet Take 1 tablet (10 mg total) by mouth 3 (three) times daily. 90 tablet 5    dicyclomine (BENTYL) 10 MG capsule Take 1 capsule (10 mg total) by mouth 3 (three) times daily before meals. 90 capsule 5    DULoxetine (CYMBALTA) 30 MG capsule Take 1 capsule (30 mg total) by mouth daily. 30 capsule 5    escitalopram (LEXAPRO) 20 MG tablet Take 1 tablet (20 mg total) by mouth daily. 30 tablet 5    ezetimibe (ZETIA) 10 MG tablet Take 1 tablet (10 mg total) by mouth daily. 30 tablet 5    ferrous sulfate 325 (65 FE) MG tablet Take 1 tablet (325 mg total) by mouth every other day. 30 tablet 5    Fluticasone-Umeclidin-Vilant (TRELEGY ELLIPTA) 100-62.5-25 MCG/ACT AEPB INHALE 1 PUFF INTO THE LUNGS DAILY 60 each 0    folic acid (FOLVITE) 1 MG tablet Take 1 tablet (1 mg total) by mouth daily. 30 tablet 5    ibuprofen (ADVIL) 200 MG tablet Take 400-600 mg by mouth daily.      lamoTRIgine (LAMICTAL) 100 MG tablet Take 1 tablet (100 mg total) by mouth daily. 30 tablet 5    loperamide (IMODIUM) 2 MG capsule Take 1 capsule (2 mg total) by mouth 2 (two) times daily. 30 capsule 5  nystatin  (MYCOSTATIN) 100000 UNIT/ML suspension Take 5 mLs (500,000 Units total) by mouth 4 (four) times daily. 60 mL 0    OLANZapine (ZYPREXA) 10 MG tablet Take 1 tablet (10 mg total) by mouth at bedtime. 30 tablet 5    omeprazole (PRILOSEC) 40 MG capsule Take 1 capsule (40 mg total) by mouth every morning. 30 capsule 5    potassium chloride SA (KLOR-CON M) 20 MEQ tablet Take 20 mEq by mouth See admin instructions. Take 1 tablet by mouth for 3 days      potassium chloride SA (KLOR-CON M) 20 MEQ tablet Take 1 tablet (20 mEq total) by mouth 2 (two) times daily for 5 days. 10 tablet 0    potassium chloride SA (KLOR-CON M) 20 MEQ tablet Take 2 tablets (40 mEq total) by mouth daily for 5 days. 10 tablet 0    prazosin (MINIPRESS) 2 MG capsule Take 1 capsule (2 mg total) by mouth at bedtime. 30 capsule 5    SAPHRIS 10 MG SUBL Place 1 tablet (10 mg total) under the tongue daily. 30 tablet 5    torsemide (DEMADEX) 20 MG tablet Take 1 tablet (20 mg total) by mouth 2 (two) times daily. 60 tablet 2     Patient Stressors: Health problems   Marital or family conflict    Patient Strengths: Ability for Contractor for treatment/growth   Treatment Modalities: Medication Management, Group therapy, Case management,  1 to 1 session with clinician, Psychoeducation, Recreational therapy.   Physician Treatment Plan for Primary Diagnosis: Schizoaffective disorder (HCC) Long Term Goal(s): Improvement in symptoms so as ready for discharge   Short Term Goals: Ability to identify changes in lifestyle to reduce recurrence of condition will improve Ability to verbalize feelings will improve Ability to disclose and discuss suicidal ideas Ability to demonstrate self-control will improve Ability to identify and develop effective coping behaviors will improve Ability to maintain clinical measurements within normal limits will improve Compliance with prescribed medications will improve Ability to  identify triggers associated with substance abuse/mental health issues will improve  Medication Management: Evaluate patient's response, side effects, and tolerance of medication regimen.  Therapeutic Interventions: 1 to 1 sessions, Unit Group sessions and Medication administration.  Evaluation of Outcomes: Progressing  Physician Treatment Plan for Secondary Diagnosis: Principal Problem:   Schizoaffective disorder (HCC)  Long Term Goal(s): Improvement in symptoms so as ready for discharge   Short Term Goals: Ability to identify changes in lifestyle to reduce recurrence of condition will improve Ability to verbalize feelings will improve Ability to disclose and discuss suicidal ideas Ability to demonstrate self-control will improve Ability to identify and develop effective coping behaviors will improve Ability to maintain clinical measurements within normal limits will improve Compliance with prescribed medications will improve Ability to identify triggers associated with substance abuse/mental health issues will improve     Medication Management: Evaluate patient's response, side effects, and tolerance of medication regimen.  Therapeutic Interventions: 1 to 1 sessions, Unit Group sessions and Medication administration.  Evaluation of Outcomes: Progressing   RN Treatment Plan for Primary Diagnosis: Schizoaffective disorder (HCC) Long Term Goal(s): Knowledge of disease and therapeutic regimen to maintain health will improve  Short Term Goals: Ability to remain free from injury will improve, Ability to verbalize frustration and anger appropriately will improve, Ability to demonstrate self-control, Ability to participate in decision making will improve, Ability to verbalize feelings will improve, Ability to disclose and discuss suicidal ideas, Ability to identify and develop  effective coping behaviors will improve, and Compliance with prescribed medications will improve  Medication  Management: RN will administer medications as ordered by provider, will assess and evaluate patient's response and provide education to patient for prescribed medication. RN will report any adverse and/or side effects to prescribing provider.  Therapeutic Interventions: 1 on 1 counseling sessions, Psychoeducation, Medication administration, Evaluate responses to treatment, Monitor vital signs and CBGs as ordered, Perform/monitor CIWA, COWS, AIMS and Fall Risk screenings as ordered, Perform wound care treatments as ordered.  Evaluation of Outcomes: Progressing   LCSW Treatment Plan for Primary Diagnosis: Schizoaffective disorder (HCC) Long Term Goal(s): Safe transition to appropriate next level of care at discharge, Engage patient in therapeutic group addressing interpersonal concerns.  Short Term Goals: Engage patient in aftercare planning with referrals and resources, Increase social support, Increase ability to appropriately verbalize feelings, Increase emotional regulation, Facilitate acceptance of mental health diagnosis and concerns, Facilitate patient progression through stages of change regarding substance use diagnoses and concerns, Identify triggers associated with mental health/substance abuse issues, and Increase skills for wellness and recovery  Therapeutic Interventions: Assess for all discharge needs, 1 to 1 time with Social worker, Explore available resources and support systems, Assess for adequacy in community support network, Educate family and significant other(s) on suicide prevention, Complete Psychosocial Assessment, Interpersonal group therapy.  Evaluation of Outcomes: Progressing   Progress in Treatment: Attending groups: Yes. and No. Participating in groups: Yes. and No. Taking medication as prescribed: Yes. Toleration medication: Yes. Family/Significant other contact made: No, will contact:  [name] will contact if given permission  Patient understands diagnosis:  Yes. Discussing patient identified problems/goals with staff: Yes. Medical problems stabilized or resolved: Yes. Denies suicidal/homicidal ideation: No. and As evidenced by:  pt reported she felt like harming other, the people who have hurt her in the past during treatment team  Issues/concerns per patient self-inventory: No. Other: None   New problem(s) identified: No, Describe:  None identified   New Short Term/Long Term Goal(s): elimination of symptoms of psychosis, medication management for mood stabilization; elimination of SI thoughts; development of comprehensive mental wellness plan.   Patient Goals:  " I haven't had a clear enough mind to set a goal yet"   Discharge Plan or Barriers: [name] will assist with appropriate discharge planning   Reason for Continuation of Hospitalization: Depression Homicidal ideation Medication stabilization  Estimated Length of Stay: 1 to 7 days   Last 3 [place] Suicide Severity Risk Score: Flowsheet Row Admission (Current) from 11/10/2022 in [facility] Most recent reading at 11/10/2022  5:00 PM ED from 11/10/2022 in [facility] Emergency Department at [facility] Most recent reading at 11/10/2022 10:39 AM ED from 11/07/2022 in [facility] Emergency Department at [facility] Most recent reading at 11/07/2022 12:56 PM  C-SSRS RISK CATEGORY No Risk No Risk No Risk       Last PHQ 2/9 Scores:    07/11/2022    1:42 PM 06/09/2022    3:51 PM 04/03/2022    1:40 PM  Depression screen PHQ 2/9  Decreased Interest 1 0   Down, Depressed, Hopeless  0 0  PHQ - 2 Score 1 0 0  Altered sleeping 0 0 3  Tired, decreased energy 1 3 1   Change in appetite 0 0 2  Feeling bad or failure about yourself  0 0 0  Trouble concentrating  0 0  Moving slowly or fidgety/restless 0 0 0  Suicidal thoughts 0 0 0  PHQ-9 Score  2 3 6   Difficult doing work/chores Not difficult at all Somewhat difficult     Scribe for Treatment  Team: Elza Rafter, Theresia Majors 11/15/2022 2:56 PM

## 2022-11-15 NOTE — Group Note (Signed)
[facility] LCSW Group Therapy Note   Group Date: 11/15/2022 Start Time: 1330 End Time: 1400   Type of Therapy/Topic:  Group Therapy:  Emotion Regulation  Participation Level:  Did Not Attend   Mood:  Description of Group:    The purpose of this group is to assist patients in learning to regulate negative emotions and experience positive emotions. Patients will be guided to discuss ways in which they have been vulnerable to their negative emotions. These vulnerabilities will be juxtaposed with experiences of positive emotions or situations, and patients challenged to use positive emotions to combat negative ones. Special emphasis will be placed on coping with negative emotions in conflict situations, and patients will process healthy conflict resolution skills.  Therapeutic Goals: Patient will identify two positive emotions or experiences to reflect on in order to balance out negative emotions:  Patient will label two or more emotions that they find the most difficult to experience:  Patient will be able to demonstrate positive conflict resolution skills through discussion or role plays:   Summary of Patient Progress:   X    Therapeutic Modalities:   Cognitive Behavioral Therapy Feelings Identification Dialectical Behavioral Therapy   Elza Rafter, LCSWA

## 2022-11-15 NOTE — Group Note (Signed)
Date:  11/15/2022 Time:  2:56 PM  Group Topic/Focus:  Self Care:   The focus of this group is to help patients understand the importance of self-care in order to improve or restore emotional, physical, spiritual, interpersonal, and financial health.    Participation Level:  Did Not Attend   Ardelle Anton 11/15/2022, 2:56 PM
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
## 2022-11-15 NOTE — Plan of Care (Signed)
  Problem: Education: Goal: Knowledge of [place] [facility] Education information/materials will improve Outcome: Progressing Goal: Emotional status will improve Outcome: Progressing Goal: Mental status will improve Outcome: Progressing Goal: Verbalization of understanding the information provided will improve Outcome: Progressing   Problem: Activity: Goal: Interest or engagement in activities will improve Outcome: Progressing Goal: Sleeping patterns will improve Outcome: Progressing   Problem: Coping: Goal: Ability to verbalize frustrations and anger appropriately will improve Outcome: Progressing Goal: Ability to demonstrate self-control will improve Outcome: Progressing   Problem: Health Behavior/Discharge Planning: Goal: Identification of resources available to assist in meeting health care needs will improve Outcome: Progressing Goal: Compliance with treatment plan for underlying cause of condition will improve Outcome: Progressing   Problem: Physical Regulation: Goal: Ability to maintain clinical measurements within normal limits will improve Outcome: Progressing   Problem: Safety: Goal: Periods of time without injury will increase Outcome: Progressing   Problem: Education: Goal: Ability to state activities that reduce stress will improve Outcome: Progressing   Problem: Education: Goal: Knowledge of General Education information will improve Description: Including pain rating scale, medication(s)/side effects and non-pharmacologic comfort measures Outcome: Progressing   Problem: Health Behavior/Discharge Planning: Goal: Ability to manage health-related needs will improve Outcome: Progressing   Problem: Clinical Measurements: Goal: Ability to maintain clinical measurements within normal limits will improve Outcome: Progressing Goal: Will remain free from infection Outcome: Progressing Goal: Diagnostic test results will improve Outcome: Progressing Goal:  Respiratory complications will improve Outcome: Progressing Goal: Cardiovascular complication will be avoided Outcome: Progressing   Problem: Activity: Goal: Risk for activity intolerance will decrease Outcome: Progressing   Problem: Nutrition: Goal: Adequate nutrition will be maintained Outcome: Progressing   Problem: Coping: Goal: Level of anxiety will decrease Outcome: Progressing   Problem: Elimination: Goal: Will not experience complications related to bowel motility Outcome: Progressing Goal: Will not experience complications related to urinary retention Outcome: Progressing   Problem: Pain Management: Goal: General experience of comfort will improve Outcome: Progressing   Problem: Safety: Goal: Ability to remain free from injury will improve Outcome: Progressing   Problem: Skin Integrity: Goal: Risk for impaired skin integrity will decrease Outcome: Progressing

## 2022-11-16 DIAGNOSIS — F251 Schizoaffective disorder, depressive type: Secondary | ICD-10-CM | POA: Diagnosis not present

## 2022-11-16 DIAGNOSIS — F4310 Post-traumatic stress disorder, unspecified: Secondary | ICD-10-CM | POA: Diagnosis not present

## 2022-11-16 MED ORDER — MELATONIN 5 MG PO TABS
5.0000 mg | ORAL_TABLET | ORAL | Status: AC
Start: 2022-11-16 — End: 2022-11-16
  Administered 2022-11-16: 5 mg via ORAL
  Filled 2022-11-16: qty 1

## 2022-11-16 MED ORDER — MELATONIN 5 MG PO TABS
5.0000 mg | ORAL_TABLET | Freq: Every day | ORAL | Status: DC
  Administered 2022-11-16: 5 mg via ORAL
  Filled 2022-11-16: qty 1

## 2022-11-16 NOTE — Progress Notes (Signed)
Patient woke up three times during the night and every time she would say I cannot sleep, during rounds Patient is sleeping. Patient required encouragement to go back to sleep. Q 15 minutes safety checks ongoing. Patient remains safe.

## 2022-11-16 NOTE — Group Note (Signed)
Recreation Therapy Group Note   Group Topic:Communication  Group Date: 11/16/2022 Start Time: 1400 End Time: 1450 Facilitators: Rosina Lowenstein, LRT, CTRS Location: Courtyard  Group Description: Halloween Trivia. LRT reads off trivia question for patients to hear while also giving them a copy of the trivia questions. Pts are given a set time limit to answer the question correctly. LRT waits for all patients to make a guess out loud. LRT facilitated post-game discussion on the importance of working well with others, active listening, and communicating. LRT and pts discussed how this can apply to life post-discharge. LRT passed out candy afterwards with RN approval.   Goal Area(s) Addressed: Patient will increase communication skills.  Patient will increase frustration tolerance skills. Patient will practice active listening.    Affect/Mood: N/A   Participation Level: Did not attend    Clinical Observations/Individualized Feedback: Kamirah did not attend group.   Plan: Continue to engage patient in RT group sessions 2-3x/week.   Rosina Lowenstein, LRT, CTRS 11/16/2022 3:07 PM

## 2022-11-16 NOTE — Plan of Care (Signed)
  Problem: Education: Goal: Knowledge of [place] [facility] Education information/materials will improve Outcome: Progressing   Problem: Education: Goal: Emotional status will improve Outcome: Progressing   Problem: Education: Goal: Mental status will improve Outcome: Progressing   Problem: Education: Goal: Verbalization of understanding the information provided will improve Outcome: Progressing   Problem: Activity: Goal: Interest or engagement in activities will improve Outcome: Progressing   Problem: Coping: Goal: Ability to verbalize frustrations and anger appropriately will improve Outcome: Progressing   Problem: Coping: Goal: Ability to demonstrate self-control will improve Outcome: Progressing
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
## 2022-11-16 NOTE — Plan of Care (Signed)
  Problem: Education: Goal: Emotional status will improve Outcome: Not Progressing Goal: Mental status will improve Outcome: Not Progressing   

## 2022-11-16 NOTE — Group Note (Signed)
Date:  11/16/2022 Time:  12:15 AM  Group Topic/Focus:  Building Self Esteem:   The Focus of this group is helping patients become aware of the effects of self-esteem on their lives, the things they and others do that enhance or undermine their self-esteem, seeing the relationship between their level of self-esteem and the choices they make and learning ways to enhance self-esteem. Recovery Goals:   The focus of this group is to identify appropriate goals for recovery and establish a plan to achieve them. Relapse Prevention Planning:   The focus of this group is to define relapse and discuss the need for planning to combat relapse.    Participation Level:  Active  Participation Quality:  Appropriate  Affect:  Appropriate  Cognitive:  Alert  Insight: Good  Engagement in Group:  Engaged  Modes of Intervention:  Education  Additional Comments:    [place] 11/16/2022, 12:15 AM

## 2022-11-16 NOTE — Progress Notes (Signed)
  D- Patient alert and oriented x4,  affect/mood has been pleasant.,Denies SI, HI, AVH, and c/o foot pain and requested, Tylenol and reported relief. Pt.  mood seemed more improved, c/o not sleeping last night, "I did not sleep until 6 am this morning". She was sociable with peers, was compliant with her medication regimen and without further complaints.A- Medications administered to patient, per MD orders. Support and encouragement provided.  Routine safety checks conducted every 15 minutes.  Patient informed to notify staff with problems or concerns. R- No adverse drug reactions noted. Patient contracts for safety at this time. Patient compliant with medications and treatment plan. Patient receptive, calm, and cooperative and pleasant throughout the day. Patient remains safe at this time.

## 2022-11-17 DIAGNOSIS — F251 Schizoaffective disorder, depressive type: Secondary | ICD-10-CM | POA: Diagnosis not present

## 2022-11-17 DIAGNOSIS — F4310 Post-traumatic stress disorder, unspecified: Secondary | ICD-10-CM | POA: Diagnosis not present

## 2022-11-17 LAB — CBC
HCT: 25.6 % — ABNORMAL LOW (ref 36.0–46.0)
Hemoglobin: 8 g/dL — ABNORMAL LOW (ref 12.0–15.0)
MCH: 28.9 pg (ref 26.0–34.0)
MCHC: 31.3 g/dL (ref 30.0–36.0)
MCV: 92.4 fL (ref 80.0–100.0)
Platelets: 319 10*3/uL (ref 150–400)
RBC: 2.77 MIL/uL — ABNORMAL LOW (ref 3.87–5.11)
RDW: 16 % — ABNORMAL HIGH (ref 11.5–15.5)
WBC: 7.1 10*3/uL (ref 4.0–10.5)
nRBC: 0 % (ref 0.0–0.2)

## 2022-11-17 LAB — RENAL FUNCTION PANEL
Albumin: 3.2 g/dL — ABNORMAL LOW (ref 3.5–5.0)
Anion gap: 9 (ref 5–15)
BUN: 32 mg/dL — ABNORMAL HIGH (ref 8–23)
CO2: 28 mmol/L (ref 22–32)
Calcium: 9.5 mg/dL (ref 8.9–10.3)
Chloride: 98 mmol/L (ref 98–111)
Creatinine, Ser: 3.25 mg/dL — ABNORMAL HIGH (ref 0.44–1.00)
GFR, Estimated: 15 mL/min — ABNORMAL LOW (ref >60)
Glucose, Bld: 96 mg/dL (ref 70–99)
Phosphorus: 3.7 mg/dL (ref 2.5–4.6)
Potassium: 3.8 mmol/L (ref 3.5–5.1)
Sodium: 135 mmol/L (ref 135–145)

## 2022-11-17 MED ORDER — EPOETIN ALFA 4000 UNIT/ML IJ SOLN
INTRAMUSCULAR | Status: AC
  Filled 2022-11-17: qty 1

## 2022-11-17 MED ORDER — MIRTAZAPINE 15 MG PO TABS
7.5000 mg | ORAL_TABLET | Freq: Every day | ORAL | Status: DC
  Administered 2022-11-17 – 2022-11-21 (×5): 7.5 mg via ORAL
  Filled 2022-11-17 (×5): qty 1

## 2022-11-17 MED ORDER — MELATONIN 5 MG PO TABS
10.0000 mg | ORAL_TABLET | Freq: Every day | ORAL | Status: DC
  Filled 2022-11-17: qty 2

## 2022-11-17 MED ORDER — MELATONIN 5 MG PO TABS
5.0000 mg | ORAL_TABLET | Freq: Every day | ORAL | Status: DC
  Administered 2022-11-17 – 2022-11-21 (×5): 5 mg via ORAL
  Filled 2022-11-17 (×5): qty 1

## 2022-11-17 MED ORDER — QUETIAPINE FUMARATE 100 MG PO TABS
100.0000 mg | ORAL_TABLET | Freq: Every day | ORAL | Status: DC
  Administered 2022-11-17: 100 mg via ORAL
  Filled 2022-11-17: qty 1

## 2022-11-17 MED ORDER — HEPARIN SODIUM (PORCINE) 1000 UNIT/ML IJ SOLN
INTRAMUSCULAR | Status: AC
  Filled 2022-11-17: qty 10

## 2022-11-17 NOTE — Plan of Care (Signed)
  Problem: Education: Goal: Knowledge of [facility] Education information/materials will improve Outcome: Progressing Goal: Emotional status will improve Outcome: Progressing Goal: Mental status will improve Outcome: Progressing Goal: Verbalization of understanding the information provided will improve Outcome: Progressing   Problem: Activity: Goal: Interest or engagement in activities will improve Outcome: Progressing   Problem: Coping: Goal: Ability to verbalize frustrations and anger appropriately will improve Outcome: Progressing   

## 2022-11-17 NOTE — Progress Notes (Signed)
Pt requested to be toileted @1850 , she allowed the nurse to assess her sacrum wound and was found with a stage 2-63m loss of skin with thickness no drainage. Pt would not allow nurse to clean, she wiped and applied her own barrier cream and refused to have dressing applied.

## 2022-11-17 NOTE — Group Note (Signed)
Recreation Therapy Group Note   Group Topic:Relaxation  Group Date: 11/17/2022 Start Time: 1400 End Time: 1440 Facilitators: Rosina Lowenstein, LRT, CTRS Location: Courtyard  Group Description: Meditation. LRT asks patients their current level of stress/anxiety from 1-10, with 10 being the highest. LRT educated on the benefits of meditation and relaxation techniques, and how it can apply to everyday life post-discharge. LRT and pt's followed along to an audio script of a "guided meditation" video. LRT asked pt their level of stress and anxiety once the prompt was finished. LRT facilitated post-activity processing to gain feedback on session.    Goal Area(s) Addressed:  Patient will practice using relaxation technique. Patient will identify a new coping skill.  Patient will follow multistep directions to reduce anxiety and stress.   Affect/Mood: Appropriate   Participation Level: Active and Engaged   Participation Quality: Independent   Behavior: Calm and Cooperative   Speech/Thought Process: Coherent   Insight: Good   Judgement: Good   Modes of Intervention: Activity   Patient Response to Interventions:  Attentive, Engaged, Interested , and Receptive   Education Outcome:  Acknowledges education   Clinical Observations/Individualized Feedback: Tiffany [name] was active in their participation of session activities and group discussion. Pt identified that her anxiety and stress were a 7 before and after the session. Pt interacted well with LRT and peers duration of session.    Plan: Continue to engage patient in RT group sessions 2-3x/week.   Rosina Lowenstein, LRT, CTRS 11/17/2022 3:30 PM

## 2022-11-17 NOTE — Group Note (Signed)
LCSW Group Therapy Note  Group Date: 11/17/2022 Start Time: 1500 End Time: 1545   Type of Therapy and Topic:  Group Therapy - Healthy vs Unhealthy Coping Skills  Participation Level:  Active   Description of Group The focus of this group was to determine what unhealthy coping techniques typically are used by group members and what healthy coping techniques would be helpful in coping with various problems. Patients were guided in becoming aware of the differences between healthy and unhealthy coping techniques. Patients were asked to identify 2-3 healthy coping skills they would like to learn to use more effectively.  Therapeutic Goals Patients learned that coping is what human beings do all day long to deal with various situations in their lives Patients defined and discussed healthy vs unhealthy coping techniques Patients identified their preferred coping techniques and identified whether these were healthy or unhealthy Patients determined 2-3 healthy coping skills they would like to become more familiar with and use more often. Patients provided support and ideas to each other   Summary of Patient Progress:  During group, Ellena expressed some understanding of topic. Patient proved open to input from peers and feedback from [name]. Patient demonstrated fair insight into the subject matter, was respectful of peers, and participated throughout the entire session.   Therapeutic Modalities Cognitive Behavioral Therapy Motivational Interviewing  Elza Rafter, [place] 11/17/2022  4:11 PM

## 2022-11-17 NOTE — Progress Notes (Signed)
Patient alert and oriented x 4, her affect/mood  have been labile, she went for dialysis early in the am and all am meds were held and administered upon her return. Pt denied having thoughts, plan or intent to harm self or others and she made a verbal commitment to safety. Nurse noted a wound care consult in EMR and instructions to clean and place foam dressing daily. Pt refused to have sacrum assessed, provider notified of patient decision and provided spoke with patient and she remain adamant about not allowing nursing staff to assess her and than begun crying and disclosed that she feels uncomfortable due past and recent violations, " Every time someone offers to help me they do other things to me". Then patient agreed to allow assigned nurse to assess her at her next diaper change but not a scheduled time. Currently, patient have not allowed for assessment and patient informed that the oncoming RN will need to assess that area prior to bedtime and was encouraged to comply and she agreed. Patient also encouraged to notify staff with problems and/or concerns, she was compliant with her medication regime,and no adverse reactions noted or reported. Pt was educated on  her new medications and allowed for questions. R-Patient contracts for safety at this time. Patient  interacts well with others on the unit.  Patient remains safe at this time. Scheduled medications administered to patient, per MD orders. Support and encouragement provided.  Routine safety checks conducted

## 2022-11-17 NOTE — Progress Notes (Signed)
Received patient in bed to unit.    Informed consent signed and in chart.    TX duration: 3.5 hrs     Transported back to floor  Hand-off given to patient's nurse.   Access used:  rt chest cvc Access issues: ran at 250 bfr d/t elevated art pressure  Total UF removed: 800 mls Medication(s) given: Epogen 4000 units      Maple Hudson, RN Dialysis Unit

## 2022-11-17 NOTE — BHH Counselor (Signed)
Pt asked to speak with [name] regarding living arrangements.   Pt reports she wants another income-based apartment but in [place].  Pt reports she is willing to go home to move her things but wants to go to [place].   [name] inquired about pt's feelings about going to a SNF as recommended by PT.   Pt declines SNF and reports she does not want to go short-term and wants to continue to live and be independent.   [name] will inform medical staff.   'Reynaldo Minium, MSW, [facility] 11/17/2022 4:21 PM
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
## 2022-11-17 NOTE — Progress Notes (Signed)
Physical Therapy Treatment Patient Details Name: Tiffany [name] MRN: [id number] DOB: 1961/01/08 Today's Date: 11/17/2022   History of Present Illness Tiffany [name] is a 62 year old African-American female who is voluntarily admitted to inpatient psychiatry for depression and agitation.  She had a visit from her family and became very disturbed and called 911 and she was brought to [facility].with complex history of multiple medical problems including HTN, ESRD recently started HD MWF.  Other problems include congestive heart failure, diabetes, COPD, polysubstance abuse, stroke, and malingering.    PT Comments  Pt was in BR with RN upon arrival. After stand pivoting on/off toilet (to/from) recliner , pt agrees to PT session but requires encouragement to participate. Author asked a lot of questions about pt's abilities prior to  admission and pt states," I don't have to talk to you about that." Overall pt is self limiting. She did attempt gait 1 x ~ 5 ft with chair follow. Encouraged pt to perform task independently if able. Pt did participate in exercises in chair but was encouraged to increase activity daily. DC recs remain appropriate. PT will continue to follow to maximize independence while decreasing caregiver burden.    If plan is discharge home, recommend the following: A little help with walking and/or transfers;Direct supervision/assist for medications management;Supervision due to cognitive status;Assist for transportation;Help with stairs or ramp for entrance;A little help with bathing/dressing/bathroom     Equipment Recommendations  Other (comment) (per pt," I have RW and w/c at home already.")       Precautions / Restrictions Precautions Precautions: Fall Restrictions Weight Bearing Restrictions: No     Mobility  Bed Mobility  General bed mobility comments: Not formally tested. pt was in [facility] with RN staff upon start of session. Pt sitting in recliner post session.     Transfers Overall transfer level: Needs assistance Equipment used: Rolling walker (2 wheels) Transfers: Sit to/from Stand Sit to Stand: Min assist, Contact guard assist  General transfer comment: Min assist to stand first time with vcs for improved technique. 2nd STS from recliner CGA.    Ambulation/Gait Ambulation/Gait assistance: Min assist Gait Distance (Feet): 5 Feet Assistive device: Rolling walker (2 wheels) Gait Pattern/deviations: Step-to pattern, Trunk flexed, Shuffle, Decreased stride length Gait velocity: dec  General Gait Details: Pt was able to ambulate ~ 5 ft with recliner follow. per pt." I only stand pivot to my w/c. I dont walk at home."    Balance Overall balance assessment: Needs assistance Sitting-balance support: Feet supported Sitting balance-Leahy Scale: Fair     Standing balance support: Bilateral upper extremity supported Standing balance-Leahy Scale: Poor Standing balance comment: no LOB howewver very poor standing tolerance    Cognition Arousal: Alert Behavior During Therapy: WFL for tasks assessed/performed Overall Cognitive Status: History of cognitive impairments - at baseline    General Comments: pt is alert but somewhat self limiting. Pt did participate but required encouragment throughout        Exercises General Exercises - Lower Extremity Ankle Circles/Pumps: AROM, 10 reps, Both Quad Sets: AROM, 10 reps, Both Heel Slides: AROM, Both, 10 reps Hip ABduction/ADduction: AROM, 10 reps, Both Straight Leg Raises: AROM, 10 reps (through minimal ROM)    General Comments General comments (skin integrity, edema, etc.): Throughout session, pt required constant encouragement. pt is self limiting and did not want to speask to Thereasa Parkin about family or outside information.      Pertinent Vitals/Pain Pain Assessment Pain Assessment: 0-10 Pain Score: 4  Pain Location: Bilateral feet  Pain Descriptors / Indicators: Burning, Numbness Pain  Intervention(s): Limited activity within patient's tolerance, Monitored during session, Repositioned     PT Goals (current goals can now be found in the care plan section) Acute Rehab PT Goals Patient Stated Goal: go home when ready Progress towards PT goals: Progressing toward goals    Frequency    Min 1X/week       AM-PAC PT "6 Clicks" Mobility   Outcome Measure  Help needed turning from your back to your side while in a flat bed without using bedrails?: None Help needed moving from lying on your back to sitting on the side of a flat bed without using bedrails?: A Little Help needed moving to and from a bed to a chair (including a wheelchair)?: A Little Help needed standing up from a chair using your arms (e.g., wheelchair or bedside chair)?: A Little Help needed to walk in hospital room?: A Lot Help needed climbing 3-5 steps with a railing? : Total 6 Click Score: 16    End of Session Equipment Utilized During Treatment: Gait belt;Oxygen Activity Tolerance: Patient tolerated treatment well;Other (comment) (Somewhat self limiting. Author question's effort.) Patient left: in chair;with nursing/sitter in room;Other (comment) (chair/ recliner pushed to day room post session) Nurse Communication: Mobility status PT Visit Diagnosis: Difficulty in walking, not elsewhere classified (R26.2)     Time: 5784-6962 PT Time Calculation (min) (ACUTE ONLY): 17 min  Charges:    $Therapeutic Activity: 8-22 mins PT General Charges $$ ACUTE PT VISIT: 1 Visit                    Jetta Lout PTA 11/17/22, 2:54 PM

## 2022-11-17 NOTE — Group Note (Unsigned)
Date:  11/17/2022 Time:  9:56 AM  Group Topic/Focus:  Movement Therapy     Participation Level:  {BHH PARTICIPATION ZOXWR:60454}  Participation Quality:  {BHH PARTICIPATION QUALITY:22265}  Affect:  {BHH AFFECT:22266}  Cognitive:  {BHH COGNITIVE:22267}  Insight: {BHH Insight2:20797}  Engagement in Group:  {BHH ENGAGEMENT IN UJWJX:91478}  Modes of Intervention:  {BHH MODES OF INTERVENTION:22269}  Additional Comments:  ***  Rodena Goldmann 11/17/2022, 9:56 AM

## 2022-11-17 NOTE — Group Note (Signed)
  Date:  11/17/2022 Time:  12:52 AM  Group Topic/Focus:  Crisis Planning:   The purpose of this group is to help patients create a crisis plan for use upon discharge or in the future, as needed.    Participation Level:  Active  Participation Quality:  Appropriate  Affect:  Appropriate  Cognitive:  Alert  Insight: Appropriate  Engagement in Group:  Engaged  Modes of Intervention:  Discussion  Additional Comments:    Maeola Harman 11/17/2022, 12:52 AM

## 2022-11-17 NOTE — Group Note (Signed)
Date:  11/17/2022 Time:  10:00 AM  Group Topic/Focus:  Movement therapy    Participation Level:  Did Not Attend   Rodena Goldmann 11/17/2022, 10:00 AM

## 2022-11-17 NOTE — Progress Notes (Signed)
[facility]  ROUNDING NOTE   Subjective:   Patient seen and evaluated during dialysis   HEMODIALYSIS FLOWSHEET:  Blood Flow Rate (mL/min): 250 mL/min Arterial Pressure (mmHg): -70 mmHg Venous Pressure (mmHg): 60 mmHg TMP (mmHg): 8 mmHg Ultrafiltration Rate (mL/min): 325 mL/min Dialysate Flow Rate (mL/min): 300 ml/min Dialysis Fluid Bolus: Normal Saline Bolus Amount (mL): 100 mL  Resting quietly in chair  Objective:  Vital signs in last 24 hours:  Temp:  [97.9 F (36.6 C)-99.3 F (37.4 C)] 98.5 F (36.9 C) (11/01 0744) Pulse Rate:  [74-88] 80 (11/01 1030) Resp:  [13-29] 19 (11/01 1030) BP: (117-175)/(69-113) 152/106 (11/01 1030) SpO2:  [100 %] 100 % (11/01 1030)  Weight change:  Filed Weights   11/13/22 1338 11/15/22 0757 11/15/22 1244  Weight: 75.8 kg 85.5 kg 84.3 kg    Intake/Output: No intake/output data recorded.   Intake/Output this shift:  No intake/output data recorded.  Physical Exam: General: NAD, sitting in chair  Head: Normocephalic, atraumatic. Moist oral mucosal membranes  Eyes: Anicteric  Lungs:  Clear to auscultation, room air  Heart: Regular rate and rhythm  Abdomen:  Soft, nontender  Extremities:  1+ peripheral edema.  Neurologic: Alert moving all four extremities  Skin: No lesions  Access: Rt chest Permcath    Basic Metabolic Panel: Recent Labs  Lab 11/11/22 0825 11/13/22 1400 11/17/22 0744  NA 139 134* 135  K 3.5 3.6 3.8  CL 102 99 98  CO2 27 30 28   GLUCOSE 97 89 96  BUN 23 31* 32*  CREATININE 3.68* 3.70* 3.25*  CALCIUM 9.0 9.0 9.5  PHOS  --  2.2* 3.7    Liver Function Tests: Recent Labs  Lab 11/11/22 0825 11/13/22 1400 11/17/22 0744  AST 23  --   --   ALT 15  --   --   ALKPHOS 120  --   --   BILITOT 0.7  --   --   PROT 6.8  --   --   ALBUMIN 3.5 3.3* 3.2*   No results for input(s): "LIPASE", "AMYLASE" in the last 168 hours. No results for input(s): "AMMONIA" in the last 168 hours.  CBC: Recent  Labs  Lab 11/11/22 0825 11/13/22 1400 11/17/22 0744  WBC 7.4 6.4 7.1  HGB 9.0* 8.0* 8.0*  HCT 29.3* 26.1* 25.6*  MCV 93.0 93.2 92.4  PLT 317 317 319    Cardiac Enzymes: No results for input(s): "CKTOTAL", "CKMB", "CKMBINDEX", "TROPONINI" in the last 168 hours.  BNP: Invalid input(s): "POCBNP"  CBG: Recent Labs  Lab 11/11/22 0750 11/13/22 2040  GLUCAP 89 91    Microbiology: Results for orders placed or performed during the hospital encounter of 11/07/22  SARS Coronavirus 2 by RT PCR (hospital order, performed in [facility] hospital lab) *cepheid single result test* Anterior Nasal Swab     Status: None   Collection Time: 11/07/22  1:51 PM   Specimen: Anterior Nasal Swab  Result Value Ref Range Status   SARS Coronavirus 2 by RT PCR NEGATIVE NEGATIVE Final    Comment: Performed at [facility], [address], [place], [place] [address]   *Note: Due to a large number of results and/or encounters for the requested time period, some results have not been displayed. A complete set of results can be found in Results Review.    Coagulation Studies: No results for input(s): "LABPROT", "INR" in the last 72 hours.  Urinalysis: No results for input(s): "COLORURINE", "LABSPEC", "PHURINE", "GLUCOSEU", "HGBUR", "BILIRUBINUR", "  KETONESUR", "PROTEINUR", "UROBILINOGEN", "NITRITE", "LEUKOCYTESUR" in the last 72 hours.  Invalid input(s): "APPERANCEUR"    Imaging: No results found.   Medications:     amLODipine  10 mg Oral Daily   asenapine  5 mg Sublingual BID   atorvastatin  20 mg Oral Daily   calcitRIOL  0.5 mcg Oral Daily   carvedilol  12.5 mg Oral BID WC   Chlorhexidine Gluconate Cloth  6 each Topical Q0600   cyclobenzaprine  10 mg Oral TID   dicyclomine  10 mg Oral TID AC   DULoxetine  30 mg Oral Daily   epoetin (EPOGEN/PROCRIT) injection  4,000 Units Intravenous Q M,W,F-HD   escitalopram  20 mg Oral Daily   ferrous sulfate  325 mg Oral QODAY   fluticasone  furoate-vilanterol  1 puff Inhalation Daily   And   umeclidinium bromide  1 puff Inhalation Daily   folic acid  1 mg Oral Daily   influenza vac split trivalent PF  0.5 mL Intramuscular Tomorrow-1000   lamoTRIgine  100 mg Oral Daily   melatonin  5 mg Oral QHS   pantoprazole  80 mg Oral Daily   prazosin  2 mg Oral QHS   torsemide  20 mg Oral BID   acetaminophen, albuterol, alteplase, haloperidol **OR** haloperidol lactate, heparin, hydrOXYzine  Assessment/ Plan:  Ms. NAKESHIA WALDECK is a 62 y.o.  female  with an extensive past medical history including schizophrenia, hypertension, CHF, diabetes, stroke, and end-stage renal disease on hemodialysis. [name] currently admitted for Schizoaffective disorder [facility]) [F25.9]   End-stage renal disease on hemodialysis. Receiving dialysis today, UF 2L. May reduce UF due to patient complaints of cramping. Will continue to monitor. Next treatment scheduled for Monday.   2. Anemia of chronic kidney disease Lab Results  Component Value Date   HGB 8.0 (L) 11/17/2022    Hgb below desired range.Will increase to EPO 10000 units with dialysis treatments.   3. Secondary Hyperparathyroidism: with outpatient labs: none available Lab Results  Component Value Date   PTH 57 02/18/2020   CALCIUM 9.5 11/17/2022   CAION 1.16 06/02/2022   PHOS 3.7 11/17/2022    Bone minerals acceptable. Continue calcitriol  4.  Hypertension with chronic kidney disease.  Home regimen includes amlodipine, carvedilol, and torsemide.  Currently receiving these medications.    Blood pressure 175/90 during dialysis   LOS: 7 Sanjna Haskew 11/1/202411:17 AM

## 2022-11-17 NOTE — Plan of Care (Addendum)
  Problem: Education: Goal: Emotional status will improve Outcome: Progressing Goal: Mental status will improve Outcome: Progressing Goal: Verbalization of understanding the information provided will improve Outcome: Progressing  Patient has been pleasant this shift interacting well with Peers and Staff compliant with treatment plan. Patient refused HS Minipress requested for Remeron for  sleep Patient advised that she had a new order of Melatonin  5 mg requested for another dose of 5 mg Melatonin on call notified OTO of Melatonin given and effective. Denies SI/A/VH endorses HI towards her family. Patient improving with ADL required minimal assistance this shift.  Support provided as needed. Patient requested PRN haldol and reported effective.

## 2022-11-17 NOTE — Group Note (Signed)
Date:  11/17/2022 Time:  8:35 PM  Group Topic/Focus:  Goals Group:   The focus of this group is to help patients establish daily goals to achieve during treatment and discuss how the patient can incorporate goal setting into their daily lives to aide in recovery.    Participation Level:  Active  Participation Quality:  Appropriate  Affect:  Appropriate  Cognitive:  Appropriate  Insight: Good  Engagement in Group:  Engaged  Modes of Intervention:  Discussion  Additional Comments:    Burt Ek 11/17/2022, 8:35 PM

## 2022-11-18 DIAGNOSIS — F251 Schizoaffective disorder, depressive type: Secondary | ICD-10-CM | POA: Diagnosis not present

## 2022-11-18 DIAGNOSIS — F4310 Post-traumatic stress disorder, unspecified: Secondary | ICD-10-CM | POA: Diagnosis not present

## 2022-11-18 MED ORDER — QUETIAPINE FUMARATE 100 MG PO TABS
100.0000 mg | ORAL_TABLET | Freq: Two times a day (BID) | ORAL | Status: DC
  Administered 2022-11-18 – 2022-11-19 (×4): 100 mg via ORAL
  Filled 2022-11-18 (×4): qty 1

## 2022-11-18 MED ORDER — LOPERAMIDE HCL 2 MG PO CAPS
2.0000 mg | ORAL_CAPSULE | Freq: Three times a day (TID) | ORAL | Status: DC
  Administered 2022-11-18 – 2022-11-22 (×11): 2 mg via ORAL
  Filled 2022-11-18 (×12): qty 1

## 2022-11-18 MED ORDER — LOPERAMIDE HCL 2 MG PO CAPS
2.0000 mg | ORAL_CAPSULE | ORAL | Status: DC | PRN
  Administered 2022-11-18 (×2): 2 mg via ORAL
  Filled 2022-11-18 (×2): qty 1

## 2022-11-18 NOTE — Group Note (Signed)
Date:  11/18/2022 Time:  8:35 PM  Group Topic/Focus:  Making Healthy Choices:   The focus of this group is to help patients identify negative/unhealthy choices they were using prior to admission and identify positive/healthier coping strategies to replace them upon discharge.    Participation Level:  Active  Participation Quality:  Appropriate  Affect:  Appropriate  Cognitive:  Appropriate  Insight: Appropriate  Engagement in Group:  Engaged  Modes of Intervention:  Discussion  Additional Comments:    Burt Ek 11/18/2022, 8:35 PM

## 2022-11-18 NOTE — Progress Notes (Signed)
[facility] MD Progress Note  11/18/2022  Tiffany [name]  MRN:  [id number]   Tiffany [name] is a 62 year old African-American female who is voluntarily admitted to inpatient psychiatry for depression and agitation.  She had a visit from her family and became very disturbed and called 911 and she was brought to [facility].with complex history of multiple medical problems including HTN, ESRD recently started HD MWF.    Subjective: Chart reviewed, patient's case discussed in multidisciplinary meeting, patient seen during rounds.  Staff report that patient has been agitated and using profanity.  Patient was also threatening towards the staff.  Per staff it was difficult to redirect the patient this a.m.   Patient during assessment endorsed in "agitated" mood.  She said" you know why I feel like this".  When asked for reason for agitation, patient said that I am having nightmares.  Patient continues to report that Minipress has not helped her with nightmares.  Patient was started on Seroquel last night on her request, she agrees to increase the dose of Seroquel to 100 mg by mouth twice daily.  Patient is not sure if she wants to restart Minipress.  Patient was encouraged to use coping strategies and try to relax throughout the day.  Patient was not cooperative with the assessment.  She was avoidant.  She denies thoughts of harming herself.  Continues to have thoughts of harming people who has" harmed her in the past" Staff reported the patient has having bouts of diarrhea today.  Patient said that she has taken loperamide 2 mg 3 times daily in the past to help with diarrhea secondary to IBS.  Will start patient on loperamide scheduled and as needed.  Principal Problem: Schizoaffective disorder (HCC) Diagnosis: Principal Problem:   Schizoaffective disorder [facility])   Past Psychiatric History:  No inpatient psychiatric admissions.  She has been seen outpatient in [place], [place], and [name].   Past Medical  History:  Past Medical History:  Diagnosis Date   Abdominal pain    Acute encephalopathy 11/21/2017   Acute exacerbation of CHF (congestive heart failure) (HCC) 03/03/2022   Acute GI bleeding 03/29/2021   Acute kidney injury superimposed on chronic kidney disease (HCC) 02/18/2020   Acute metabolic encephalopathy 02/20/2020   Acute on chronic diastolic heart failure (HCC) 02/04/2022   Acute on chronic renal failure (HCC) 06/02/2022   Aggressive behavior    Agitation 11/22/2017   Anoxic brain injury (HCC) 09/08/2016   C. Arrest due to respiratory failure and COPD exacerbation   Anxiety    Arthritis    "all over" (04/10/2016)   Asthma 10/18/2010   Binge eating disorder    Blood loss anemia 04/24/2011   CAP (community acquired pneumonia) 06/22/2015   Cardiac arrest (HCC) 09/08/2016   PEA   Carotid artery stenosis    1-39% bilateral by dopplers 11/2016   Chronic diastolic (congestive) heart failure (HCC)    Chronic kidney disease, stage 3 (HCC)    Chronic kidney disease, stage 3b (HCC) 06/06/2021   Chronic pain syndrome 06/18/2012   Chronic post-traumatic stress disorder (PTSD) 05/27/2018   Chronic respiratory failure with hypoxia and hypercapnia (HCC) 06/22/2015   TRILOGY Vent >AVAPA-ES., Vt target 200-400, Max P 30 , PS max 20 , PS min 6-10 , E Max 6, E Min 4, Rate Auto AVAPS Rate 2 (titrate for pt comfort) , bleed O2 at 5l/m continuous flow .    CKD (chronic kidney disease) 02/03/2022   Closed displaced fracture of fifth metacarpal bone  03/21/2018   Cocaine use disorder, severe, in sustained remission (HCC) 12/17/2015   Complication of anesthesia    decreased bp, decreased heart rate   COPD (chronic obstructive pulmonary disease) (HCC) 07/08/2014   Delusional disorder, persecutory type (HCC) 06/26/2021   Depression    Diabetic neuropathy (HCC) 04/24/2011   Difficulty with speech 01/24/2018   Disorder of nervous system    Drug abuse (HCC) 11/21/2017   Dyslipidemia 04/24/2011    E. coli UTI 02/20/2020   Elevated troponin 04/28/2012   Emphysema    Encephalopathy 11/21/2017   Essential hypertension 03/22/2016   Facet arthropathy, lumbar 06/12/2022   Fibula fracture 07/10/2016   Frequent falls 10/11/2017   GERD (gastroesophageal reflux disease)    GI bleed 03/30/2021   Gout 04/11/2017   Heart attack (HCC) 1980s   Hematochezia    History of blood transfusion 1994   "couldn't stop bleeding from my period"   History of drug abuse in remission (HCC) 11/28/2015   Quit in 2017   Hyperlipidemia 03/31/2021   Hyperlipidemia LDL goal <70    Hypocalcemia    Hypokalemia    Hypomagnesemia    Incontinence    Manic depression (HCC)    Morbid obesity (HCC) 10/18/2010   Obstructive sleep apnea 10/18/2010   On home oxygen therapy    "6L; 24/7" (04/10/2016)   OSA on CPAP    "wear mask sometimes" (04/10/2016)   Painless rectal bleeding 12/31/2015   Paranoid (HCC)    "sometimes; I'm on RX for it" (04/10/2016)   Periumbilical abdominal pain    Prolonged Q-T interval on ECG    Rectal bleeding 12/31/2015   Recurrent syncope 07/09/2021   Rhabdomyolysis 06/16/2021   Schizoaffective disorder, bipolar type (HCC) 04/05/2018   Seasonal allergies    Seborrheic keratoses 12/31/2013   Seizures (HCC)    "don't know what kind; last one was ~ 1 yr ago" (04/10/2016)   Sinus bradycardia    Skin ulcer of sacrum, limited to breakdown of skin (HCC) 03/08/2022   Spondylolisthesis at L3-L4 level 06/12/2022   Stroke (HCC) 1980s   denies residual on 04/10/2016   Thrush 09/19/2013   Type 2 diabetes mellitus (HCC) 10/18/2010    Past Surgical History:  Procedure Laterality Date   CESAREAN SECTION  1997   COLONOSCOPY WITH PROPOFOL N/A 04/01/2021   Procedure: COLONOSCOPY WITH PROPOFOL;  Surgeon: Jeani Hawking, MD;  Location: [facility] ENDOSCOPY;  Service: Gastroenterology;  Laterality: N/A;  Rectal bleeding with drop in hemoglobin to 7.2 g/dL   HERNIA REPAIR     IR CHOLANGIOGRAM EXISTING TUBE   07/20/2016   IR PERC CHOLECYSTOSTOMY  05/10/2016   IR RADIOLOGIST EVAL & MGMT  06/08/2016   IR RADIOLOGIST EVAL & MGMT  06/29/2016   IR SINUS/FIST TUBE CHK-NON GI  07/12/2016   RIGHT/LEFT HEART CATH AND CORONARY ANGIOGRAPHY N/A 06/19/2017   Procedure: RIGHT/LEFT HEART CATH AND CORONARY ANGIOGRAPHY;  Surgeon: Dolores Patty, MD;  Location: [facility] INVASIVE CV LAB;  Service: Cardiovascular;  Laterality: N/A;   TIBIA IM NAIL INSERTION Right 07/12/2016   Procedure: INTRAMEDULLARY (IM) NAIL RIGHT TIBIA;  Surgeon: Tarry Kos, MD;  Location: [facility] OR;  Service: Orthopedics;  Laterality: Right;   UMBILICAL HERNIA REPAIR  ~ 1963   "that's why I don't have a belly button"   VAGINAL HYSTERECTOMY     Family History:  Family History  Problem Relation Age of Onset   Cancer Mother        lung   Depression Mother  Cancer Father        prostate   Depression Sister    Anxiety disorder Sister    Schizophrenia Sister    Bipolar disorder Sister    Depression Sister    Depression Brother    Heart failure Other        cousin    Social History:  Social History   Substance and Sexual Activity  Alcohol Use No   Alcohol/week: 0.0 standard drinks of alcohol     Social History   Substance and Sexual Activity  Drug Use Not Currently   Types: Cocaine   Comment: 04/10/2016 "last used cocaine back in November 2017"    Social History   Socioeconomic History   Marital status: Widowed    Spouse name: Not on file   Number of children: 3   Years of education: 12th   Highest education level: 12th grade  Occupational History   Occupation: disabled    Comment: factory production  Tobacco Use   Smoking status: Former    Current packs/day: 0.00    Average packs/day: 1.5 packs/day for 39.1 years (58.6 ttl pk-yrs)    Types: Cigarettes    Start date: 03/13/1977    Quit date: 04/10/2016    Years since quitting: 6.6    Passive exposure: Past   Smokeless tobacco: Never  Vaping Use   Vaping status: Never Used   Substance and Sexual Activity   Alcohol use: No    Alcohol/week: 0.0 standard drinks of alcohol   Drug use: Not Currently    Types: Cocaine    Comment: 04/10/2016 "last used cocaine back in November 2017"   Sexual activity: Not Currently    Birth control/protection: Surgical  Other Topics Concern   Not on file  Social History Narrative   Has 1 son, [name]   Lives with son and his boyfriend   Her house has ramps and handrails should she ever needs them.    Her mother lives down the street from her and is a good support person in addition to her son.   She drives herself, has private transportation.    Cocaine free since 02/24/16, smoke free since 04/10/16   Social Determinants of Health   Financial Resource Strain: Low Risk  (07/11/2022)   Overall Financial Resource Strain (CARDIA)    Difficulty of Paying Living Expenses: Not very hard  Food Insecurity: Food Insecurity Present (11/10/2022)   Hunger Vital Sign    Worried About Running Out of Food in the Last Year: Sometimes true    Ran Out of Food in the Last Year: Sometimes true  Transportation Needs: No Transportation Needs (11/10/2022)   PRAPARE - Administrator, Civil Service (Medical): No    Lack of Transportation (Non-Medical): No  Physical Activity: Inactive (07/11/2022)   Exercise Vital Sign    Days of Exercise per Week: 0 days    Minutes of Exercise per Session: 0 min  Stress: Stress Concern Present (07/11/2022)   [employer] of Occupational Health - Occupational Stress Questionnaire    Feeling of Stress : To some extent  Social Connections: Socially Isolated (07/11/2022)   Social Connection and Isolation Panel [NHANES]    Frequency of Communication with Friends and Family: Three times a week    Frequency of Social Gatherings with Friends and Family: Three times a week    Attends Religious Services: Never    Active Member of Clubs or Organizations: No    Attends Banker Meetings:  Never     Marital Status: Widowed                           Sleep: Poor  Appetite:  Fair  Current Medications: Current Facility-Administered Medications  Medication Dose Route Frequency Provider Last Rate Last Admin   acetaminophen (TYLENOL) tablet 650 mg  650 mg Oral Q6H PRN Bing Neighbors, NP   650 mg at 11/18/22 1035   albuterol (VENTOLIN HFA) 108 (90 Base) MCG/ACT inhaler 2 puff  2 puff Inhalation Q6H PRN Bing Neighbors, NP   2 puff at 11/18/22 1008   alteplase (CATHFLO ACTIVASE) injection 2 mg  2 mg Intracatheter Once PRN Wendee Beavers, NP       amLODipine (NORVASC) tablet 10 mg  10 mg Oral Daily Bing Neighbors, NP   10 mg at 11/18/22 1003   atorvastatin (LIPITOR) tablet 20 mg  20 mg Oral Daily Bing Neighbors, NP   20 mg at 11/18/22 1002   calcitRIOL (ROCALTROL) capsule 0.5 mcg  0.5 mcg Oral Daily Bing Neighbors, NP   0.5 mcg at 11/18/22 1002   carvedilol (COREG) tablet 12.5 mg  12.5 mg Oral BID WC Bing Neighbors, NP   12.5 mg at 11/18/22 1003   Chlorhexidine Gluconate Cloth 2 % PADS 6 each  6 each Topical Q0600 Wendee Beavers, NP       cyclobenzaprine (FLEXERIL) tablet 10 mg  10 mg Oral TID Sarina Ill, DO   10 mg at 11/18/22 1004   dicyclomine (BENTYL) capsule 10 mg  10 mg Oral TID AC Bing Neighbors, NP   10 mg at 11/18/22 1125   DULoxetine (CYMBALTA) DR capsule 30 mg  30 mg Oral Daily Bing Neighbors, NP   30 mg at 11/18/22 1005   epoetin alfa (EPOGEN) injection 4,000 Units  4,000 Units Intravenous Q M,W,F-HD Wendee Beavers, NP   4,000 Units at 11/17/22 1010   escitalopram (LEXAPRO) tablet 20 mg  20 mg Oral Daily Bing Neighbors, NP   20 mg at 11/18/22 1005   ferrous sulfate tablet 325 mg  325 mg Oral Maryland Pink, NP   325 mg at 11/17/22 1153   fluticasone furoate-vilanterol (BREO ELLIPTA) 100-25 MCG/ACT 1 puff  1 puff Inhalation Daily Bing Neighbors, NP   1 puff at 11/18/22 1009   And   umeclidinium bromide  (INCRUSE ELLIPTA) 62.5 MCG/ACT 1 puff  1 puff Inhalation Daily Bing Neighbors, NP   1 puff at 11/18/22 1009   folic acid (FOLVITE) tablet 1 mg  1 mg Oral Daily Bing Neighbors, NP   1 mg at 11/18/22 1001   haloperidol (HALDOL) tablet 5 mg  5 mg Oral TID PRN Bing Neighbors, NP   5 mg at 11/16/22 2216   Or   haloperidol lactate (HALDOL) injection 5 mg  5 mg Intramuscular TID PRN Bing Neighbors, NP       heparin injection 1,000 Units  1,000 Units Intracatheter PRN Wendee Beavers, NP   1,000 Units at 11/17/22 1610   hydrOXYzine (ATARAX) tablet 25 mg  25 mg Oral TID PRN Bing Neighbors, NP   25 mg at 11/17/22 1624   influenza vac split trivalent PF (FLULAVAL) injection 0.5 mL  0.5 mL Intramuscular Tomorrow-1000 Sarina Ill, DO       lamoTRIgine (LAMICTAL) tablet 100 mg  100 mg Oral Daily Bing Neighbors, NP  100 mg at 11/18/22 1002   loperamide (IMODIUM) capsule 2 mg  2 mg Oral PRN Lewanda Rife, MD   2 mg at 11/18/22 1038   loperamide (IMODIUM) capsule 2 mg  2 mg Oral TID Lewanda Rife, MD       melatonin tablet 5 mg  5 mg Oral QHS Lewanda Rife, MD   5 mg at 11/17/22 2116   mirtazapine (REMERON) tablet 7.5 mg  7.5 mg Oral QHS Lewanda Rife, MD   7.5 mg at 11/17/22 2114   pantoprazole (PROTONIX) EC tablet 80 mg  80 mg Oral Daily Bing Neighbors, NP   80 mg at 11/18/22 1004   QUEtiapine (SEROQUEL) tablet 100 mg  100 mg Oral BID Lewanda Rife, MD   100 mg at 11/18/22 1436   torsemide (DEMADEX) tablet 20 mg  20 mg Oral BID Bing Neighbors, NP   20 mg at 11/18/22 1005    Lab Results:  Results for orders placed or performed during the hospital encounter of 11/10/22 (from the past 48 hour(s))  Renal function panel     Status: Abnormal   Collection Time: 11/17/22  7:44 AM  Result Value Ref Range   Sodium 135 135 - 145 mmol/L   Potassium 3.8 3.5 - 5.1 mmol/L   Chloride 98 98 - 111 mmol/L   CO2 28 22 - 32 mmol/L   Glucose, Bld 96 70 - 99  mg/dL    Comment: Glucose reference range applies only to samples taken after fasting for at least 8 hours.   BUN 32 (H) 8 - 23 mg/dL   Creatinine, Ser 7.62 (H) 0.44 - 1.00 mg/dL   Calcium 9.5 8.9 - 83.1 mg/dL   Phosphorus 3.7 2.5 - 4.6 mg/dL   Albumin 3.2 (L) 3.5 - 5.0 g/dL   GFR, Estimated 15 (L) >60 mL/min    Comment: (NOTE) Calculated using the CKD-EPI Creatinine Equation (2021)    Anion gap 9 5 - 15    Comment: Performed at [facility], [address] [place], [place] [address]  CBC     Status: Abnormal   Collection Time: 11/17/22  7:44 AM  Result Value Ref Range   WBC 7.1 4.0 - 10.5 K/uL   RBC 2.77 (L) 3.87 - 5.11 MIL/uL   Hemoglobin 8.0 (L) 12.0 - 15.0 g/dL   HCT 60.7 (L) 37.1 - 06.2 %   MCV 92.4 80.0 - 100.0 fL   MCH 28.9 26.0 - 34.0 pg   MCHC 31.3 30.0 - 36.0 g/dL   RDW 69.4 (H) 85.4 - 62.7 %   Platelets 319 150 - 400 K/uL   nRBC 0.0 0.0 - 0.2 %    Comment: Performed at [facility], [address] [place], [place] [address]   *Note: Due to a large number of results and/or encounters for the requested time period, some results have not been displayed. A complete set of results can be found in Results Review.     Blood Alcohol level:  Lab Results  Component Value Date   ETH <10 11/10/2022   ETH <10 06/21/2022    Metabolic Disorder Labs: Lab Results  Component Value Date   HGBA1C 5.0 01/05/2022   MPG 99.67 06/17/2021   MPG 105.41 07/23/2020   No results found for: "PROLACTIN" Lab Results  Component Value Date   CHOL 151 11/14/2022   TRIG 100 11/14/2022   HDL 65 11/14/2022   CHOLHDL 2.3 11/14/2022   VLDL 20 11/14/2022  LDLCALC 66 11/14/2022   LDLCALC 54 08/05/2018     Musculoskeletal: Strength & Muscle Tone: decreased Gait & Station: unable to stand in wheelchair Patient leans: N/A  Psychiatric Specialty Exam:  Presentation  General Appearance: Appropriate for Environment  Eye Contact:Fair  Speech:Clear and  Coherent  Speech Volume:Normal  Mood and Affect  Mood: Agitated Affect: Avoidant, not cooperative  Thought Process  Thought Processes:Coherent; Goal Directed  Descriptions of Associations:Intact  Orientation:Full (Time, Place and Person)  Thought Content:Abstract Reasoning; Rumination  History of Schizophrenia/Schizoaffective disorder:Yes  Duration of Psychotic Symptoms:Greater than six months  Hallucinations:Denies  Ideas of Reference:None  Suicidal Thoughts:Denies Homicidal Thoughts:Positive  Sensorium  Memory:Recent Fair  Judgment: Limited Insight: Improving  Executive Functions  Concentration:Fair  Attention Span:Fair   Language:Fair   Psychomotor Activity  Psychomotor Activity:Normal   Assets  Assets:Communication Skills; Desire for Improvement   Sleep  Sleep:Poor    Physical Exam: Physical Exam Constitutional:      Appearance: Normal appearance.  HENT:     Head: Normocephalic and atraumatic.     Nose: Nose normal.  Eyes:     Pupils: Pupils are equal, round, and reactive to light.  Cardiovascular:     Rate and Rhythm: Regular rhythm.  Pulmonary:     Effort: Pulmonary effort is normal.  Abdominal:     General: There is no distension.     Tenderness: There is no abdominal tenderness.  Neurological:     Mental Status: [name] alert.    Review of Systems  Constitutional:  Negative for chills and fever.  HENT:  Negative for hearing loss and sore throat.   Eyes:  Negative for blurred vision and double vision.  Respiratory:  Negative for cough and sputum production.   Cardiovascular:  Negative for chest pain and palpitations.  Gastrointestinal:  Negative for nausea and vomiting.  Musculoskeletal:  Positive for joint pain.  Neurological:  Negative for dizziness and speech change.   Blood pressure (!) 149/83, pulse 81, temperature 98.6 F (37 C), resp. rate 18, height 5\' 3"  (1.6 m), weight 84.3 kg, SpO2 100%. Body mass index is 32.92  kg/m.   Treatment Plan Summary: Daily contact with patient to assess and evaluate symptoms and progress in treatment and Medication management  Increase the dose of quetiapine to 100 mg po BID 11/18/22 Cymbalta and Lexapro for depression and anxiety We will start on Remeron 7.5 mg at bedtime to help with depression and sleep, 11/17/22 Will continue on melatonin  5 mg at bedtime Loperamide 2 mg po TID, 11/18/22  Lewanda Rife, MD

## 2022-11-18 NOTE — Group Note (Signed)
Date:  11/18/2022 Time:  3:33 PM  Group Topic/Focus:  Outside Recreation    Participation Level:  Did Not Attend    Rodena Goldmann 11/18/2022, 3:33 PM

## 2022-11-18 NOTE — Plan of Care (Signed)
D- Patient alert and oriented.  Pt irritable and easily agitated. C/o leg pain. Amb OOB to w/c, self propel. Denies SI, HI, AVH.  A- Scheduled medications administered to patient, per MD orders. PRN given for pain. Support and encouragement provided.  Routine safety checks conducted every 15 minutes.  Patient informed to notify staff with problems or concerns. R- No adverse drug reactions noted. Patient contracts for safety at this time. Patient compliant with medications and treatment plan. Patient interacts well with others on the unit.  Patient remains safe at this time.  Problem: Activity: Goal: Interest or engagement in activities will improve Outcome: Progressing Goal: Sleeping patterns will improve Outcome: Progressing   Problem: Coping: Goal: Ability to verbalize frustrations and anger appropriately will improve Outcome: Progressing Goal: Ability to demonstrate self-control will improve Outcome: Progressing   Problem: Safety: Goal: Periods of time without injury will increase Outcome: Progressing   Problem: Education: Goal: Ability to state activities that reduce stress will improve Outcome: Progressing   Problem: Pain Management: Goal: General experience of comfort will improve Outcome: Progressing

## 2022-11-18 NOTE — Group Note (Signed)
LCSW Group Therapy Note   Group Date: 11/18/2022 Start Time: 1345 End Time: 1435   Type of Therapy and Topic:  Group Therapy: Awareness of Self and Feelings  Participation Level:  Active   Summary of Patient Progress:  Patient was alert and active in group. Patient was able to describe the impact that feelings have on behaviors.     Therapeutic Modalities:  Cognitive Behavioral Therapy  Whitney Post, Theresia Majors 11/18/2022  4:04 PM

## 2022-11-18 NOTE — Progress Notes (Signed)
   11/18/22 2300  Psych Admission Type (Psych Patients Only)  Admission Status Voluntary  Psychosocial Assessment  Patient Complaints Other (Comment) ("i just dont want to be bothered, today hasnt been a good day")  Eye Contact Fair  Facial Expression Flat  Affect Appropriate to circumstance  Speech Logical/coherent  Interaction Assertive  Motor Activity Slow  Appearance/Hygiene In scrubs  Behavior Characteristics Calm  Mood Helpless  Thought Process  Coherency Circumstantial  Content Blaming others  Delusions None reported or observed  Perception WDL  Hallucination None reported or observed  Judgment Impaired  Confusion None  Danger to Self  Current suicidal ideation? Denies  Danger to Others Abnormal  Harmful Behavior to others No threats or harm toward other people  Destructive Behavior No threats or harm toward property

## 2022-11-18 NOTE — Progress Notes (Signed)
   11/18/22 0645  15 Minute Checks  Location Bedroom  Visual Appearance Calm  Behavior Sleeping  Sleep (Behavioral Health Patients Only)  Calculate sleep? (Click Yes once per 24 hr at 0600 safety check) Yes  Documented sleep last 24 hours 7.25

## 2022-11-18 NOTE — Plan of Care (Signed)
  Problem: Education: Goal: Knowledge of [facility] Education information/materials will improve Outcome: Not Progressing Goal: Emotional status will improve Outcome: Not Progressing Goal: Verbalization of understanding the information provided will improve Outcome: Not Progressing

## 2022-11-18 NOTE — Progress Notes (Signed)
Patient admitted to the [facility] Psych floor on 10.25.24 for thoughts of HI toward family, reports of paranoia, and complaints of depression.   Patient currently denies SI/AVH but endorses HI toward family whom she feels are partially responsible for her needing to be admitted. Patient presents with irritability. Affect preoccupied. Mood labile.  At approx 1030, patient had a yelling outburst at staff when staff was unable to find her requested program on tv. Calling staff several expletives and ushering her middle finger. Patient was able to self calm in her room and no further issues ensued. She was counseled on inappropriate language/behavior.  Q15 minute unit checks in place.

## 2022-11-18 NOTE — Progress Notes (Signed)
Alert and oriented. Pt remain clean and dry throughout the night. Pt continues to refused wound care to the sacral area. No s/s of acute distress noted. No c/o pain/discomfort noted.

## 2022-11-19 ENCOUNTER — Other Ambulatory Visit: Payer: Self-pay | Admitting: Family Medicine

## 2022-11-19 DIAGNOSIS — F4310 Post-traumatic stress disorder, unspecified: Secondary | ICD-10-CM | POA: Diagnosis not present

## 2022-11-19 DIAGNOSIS — F251 Schizoaffective disorder, depressive type: Secondary | ICD-10-CM | POA: Diagnosis not present

## 2022-11-19 NOTE — Group Note (Signed)
Date:  11/19/2022 Time:  5:36 PM  Group Topic/Focus:  Outside Recreation    Participation Level:  Did Not Attend   Rodena Goldmann 11/19/2022, 5:36 PM

## 2022-11-19 NOTE — Progress Notes (Signed)
Patient admitted voluntarily to the [facility] Psych floor on 10.25.24 for thoughts of HI toward family, reports of paranoia, and complaints of depression.  Today she denies SI/HI/AVH. She denies anxiety and depression. Patient refuses to allow assessment/care to the inner buttock region where a wound and a scar have been noted. Education provided but reinforcement needed.   Patient appears to be getting more independent with ADL's. Support given.  Q15 minute unit checks in place.

## 2022-11-19 NOTE — Progress Notes (Signed)
[facility] MD Progress Note  11/19/2022  SHYNICE SIGEL  MRN:  [id number]   Tiffany [name] is a 62 year old African-American female who is voluntarily admitted to inpatient psychiatry for depression and agitation.  She had a visit from her family and became very disturbed and called 911 and she was brought to [facility].with complex history of multiple medical problems including HTN, ESRD recently started HD MWF.    Subjective: Chart reviewed, patient's case discussed in multidisciplinary meeting, patient seen during rounds.  No behavior issues reported overnight.  Per staff report patient slept good last night.  Patient reports that she is doing fine today.  Patient is requesting to discontinue Lamictal.  Patient said that she was not taking the Lamictal prior to admission.  Patient also was informed that patient will be discharged back to her apartment.  Patient reports that she has home health care in place.  Patient was encouraged to check the availability of the handicap access unit in the apartment complex she is staying at. Patient was encouraged to work with Child psychotherapist on housing options.  Today patient denies any bout of diarrhea.  She reports loperamide has helped her.  Patient denies any intention of harming herself.  She continues to have thoughts of hurting people who have hurt her in the past.  She denies any specific plan or intention to do so.  Patient was encouraged to attend group and work on coping strategies.  Principal Problem: Schizoaffective disorder (HCC) Diagnosis: Principal Problem:   Schizoaffective disorder [facility])   Past Psychiatric History:  No inpatient psychiatric admissions.  She has been seen outpatient in [place], [place], and [place].   Past Medical History:  Past Medical History:  Diagnosis Date   Abdominal pain    Acute encephalopathy 11/21/2017   Acute exacerbation of CHF (congestive heart failure) (HCC) 03/03/2022   Acute GI bleeding 03/29/2021   Acute  kidney injury superimposed on chronic kidney disease (HCC) 02/18/2020   Acute metabolic encephalopathy 02/20/2020   Acute on chronic diastolic heart failure (HCC) 02/04/2022   Acute on chronic renal failure (HCC) 06/02/2022   Aggressive behavior    Agitation 11/22/2017   Anoxic brain injury (HCC) 09/08/2016   C. Arrest due to respiratory failure and COPD exacerbation   Anxiety    Arthritis    "all over" (04/10/2016)   Asthma 10/18/2010   Binge eating disorder    Blood loss anemia 04/24/2011   CAP (community acquired pneumonia) 06/22/2015   Cardiac arrest (HCC) 09/08/2016   PEA   Carotid artery stenosis    1-39% bilateral by dopplers 11/2016   Chronic diastolic (congestive) heart failure (HCC)    Chronic kidney disease, stage 3 (HCC)    Chronic kidney disease, stage 3b (HCC) 06/06/2021   Chronic pain syndrome 06/18/2012   Chronic post-traumatic stress disorder (PTSD) 05/27/2018   Chronic respiratory failure with hypoxia and hypercapnia (HCC) 06/22/2015   TRILOGY Vent >AVAPA-ES., Vt target 200-400, Max P 30 , PS max 20 , PS min 6-10 , E Max 6, E Min 4, Rate Auto AVAPS Rate 2 (titrate for pt comfort) , bleed O2 at 5l/m continuous flow .    CKD (chronic kidney disease) 02/03/2022   Closed displaced fracture of fifth metacarpal bone 03/21/2018   Cocaine use disorder, severe, in sustained remission (HCC) 12/17/2015   Complication of anesthesia    decreased bp, decreased heart rate   COPD (chronic obstructive pulmonary disease) (HCC) 07/08/2014   Delusional disorder, persecutory type (HCC) 06/26/2021  Depression    Diabetic neuropathy (HCC) 04/24/2011   Difficulty with speech 01/24/2018   Disorder of nervous system    Drug abuse (HCC) 11/21/2017   Dyslipidemia 04/24/2011   E. coli UTI 02/20/2020   Elevated troponin 04/28/2012   Emphysema    Encephalopathy 11/21/2017   Essential hypertension 03/22/2016   Facet arthropathy, lumbar 06/12/2022   Fibula fracture 07/10/2016    Frequent falls 10/11/2017   GERD (gastroesophageal reflux disease)    GI bleed 03/30/2021   Gout 04/11/2017   Heart attack (HCC) 1980s   Hematochezia    History of blood transfusion 1994   "couldn't stop bleeding from my period"   History of drug abuse in remission (HCC) 11/28/2015   Quit in 2017   Hyperlipidemia 03/31/2021   Hyperlipidemia LDL goal <70    Hypocalcemia    Hypokalemia    Hypomagnesemia    Incontinence    Manic depression (HCC)    Morbid obesity (HCC) 10/18/2010   Obstructive sleep apnea 10/18/2010   On home oxygen therapy    "6L; 24/7" (04/10/2016)   OSA on CPAP    "wear mask sometimes" (04/10/2016)   Painless rectal bleeding 12/31/2015   Paranoid (HCC)    "sometimes; I'm on RX for it" (04/10/2016)   Periumbilical abdominal pain    Prolonged Q-T interval on ECG    Rectal bleeding 12/31/2015   Recurrent syncope 07/09/2021   Rhabdomyolysis 06/16/2021   Schizoaffective disorder, bipolar type (HCC) 04/05/2018   Seasonal allergies    Seborrheic keratoses 12/31/2013   Seizures (HCC)    "don't know what kind; last one was ~ 1 yr ago" (04/10/2016)   Sinus bradycardia    Skin ulcer of sacrum, limited to breakdown of skin (HCC) 03/08/2022   Spondylolisthesis at L3-L4 level 06/12/2022   Stroke (HCC) 1980s   denies residual on 04/10/2016   Thrush 09/19/2013   Type 2 diabetes mellitus (HCC) 10/18/2010    Past Surgical History:  Procedure Laterality Date   CESAREAN SECTION  1997   COLONOSCOPY WITH PROPOFOL N/A 04/01/2021   Procedure: COLONOSCOPY WITH PROPOFOL;  Surgeon: Jeani Hawking, MD;  Location: [facility] ENDOSCOPY;  Service: Gastroenterology;  Laterality: N/A;  Rectal bleeding with drop in hemoglobin to 7.2 g/dL   HERNIA REPAIR     IR CHOLANGIOGRAM EXISTING TUBE  07/20/2016   IR PERC CHOLECYSTOSTOMY  05/10/2016   IR RADIOLOGIST EVAL & MGMT  06/08/2016   IR RADIOLOGIST EVAL & MGMT  06/29/2016   IR SINUS/FIST TUBE CHK-NON GI  07/12/2016   RIGHT/LEFT HEART CATH AND CORONARY  ANGIOGRAPHY N/A 06/19/2017   Procedure: RIGHT/LEFT HEART CATH AND CORONARY ANGIOGRAPHY;  Surgeon: Dolores Patty, MD;  Location: [facility] INVASIVE CV LAB;  Service: Cardiovascular;  Laterality: N/A;   TIBIA IM NAIL INSERTION Right 07/12/2016   Procedure: INTRAMEDULLARY (IM) NAIL RIGHT TIBIA;  Surgeon: Tarry Kos, MD;  Location: [facility] OR;  Service: Orthopedics;  Laterality: Right;   UMBILICAL HERNIA REPAIR  ~ 1963   "that's why I don't have a belly button"   VAGINAL HYSTERECTOMY     Family History:  Family History  Problem Relation Age of Onset   Cancer Mother        lung   Depression Mother    Cancer Father        prostate   Depression Sister    Anxiety disorder Sister    Schizophrenia Sister    Bipolar disorder Sister    Depression Sister    Depression Brother  Heart failure Other        cousin    Social History:  Social History   Substance and Sexual Activity  Alcohol Use No   Alcohol/week: 0.0 standard drinks of alcohol     Social History   Substance and Sexual Activity  Drug Use Not Currently   Types: Cocaine   Comment: 04/10/2016 "last used cocaine back in November 2017"    Social History   Socioeconomic History   Marital status: Widowed    Spouse name: Not on file   Number of children: 3   Years of education: 12th   Highest education level: 12th grade  Occupational History   Occupation: disabled    Comment: factory production  Tobacco Use   Smoking status: Former    Current packs/day: 0.00    Average packs/day: 1.5 packs/day for 39.1 years (58.6 ttl pk-yrs)    Types: Cigarettes    Start date: 03/13/1977    Quit date: 04/10/2016    Years since quitting: 6.6    Passive exposure: Past   Smokeless tobacco: Never  Vaping Use   Vaping status: Never Used  Substance and Sexual Activity   Alcohol use: No    Alcohol/week: 0.0 standard drinks of alcohol   Drug use: Not Currently    Types: Cocaine    Comment: 04/10/2016 "last used cocaine back in November 2017"    Sexual activity: Not Currently    Birth control/protection: Surgical  Other Topics Concern   Not on file  Social History Narrative   Has 1 son, Tiffany [name]   Lives with son and his boyfriend   Her house has ramps and handrails should she ever needs them.    Her mother lives down the street from her and is a good support person in addition to her son.   She drives herself, has private transportation.    Cocaine free since 02/24/16, smoke free since 04/10/16   Social Determinants of Health   Financial Resource Strain: Low Risk  (07/11/2022)   Overall Financial Resource Strain (CARDIA)    Difficulty of Paying Living Expenses: Not very hard  Food Insecurity: Food Insecurity Present (11/10/2022)   Hunger Vital Sign    Worried About Running Out of Food in the Last Year: Sometimes true    Ran Out of Food in the Last Year: Sometimes true  Transportation Needs: No Transportation Needs (11/10/2022)   PRAPARE - Administrator, Civil Service (Medical): No    Lack of Transportation (Non-Medical): No  Physical Activity: Inactive (07/11/2022)   Exercise Vital Sign    Days of Exercise per Week: 0 days    Minutes of Exercise per Session: 0 min  Stress: Stress Concern Present (07/11/2022)   [employer] of Occupational Health - Occupational Stress Questionnaire    Feeling of Stress : To some extent  Social Connections: Socially Isolated (07/11/2022)   Social Connection and Isolation Panel [NHANES]    Frequency of Communication with Friends and Family: Three times a week    Frequency of Social Gatherings with Friends and Family: Three times a week    Attends Religious Services: Never    Active Member of Clubs or Organizations: No    Attends Banker Meetings: Never    Marital Status: Widowed                           Sleep: improved  Appetite:  Fair  Current Medications: Current Facility-Administered  Medications  Medication Dose Route Frequency Provider Last  Rate Last Admin   acetaminophen (TYLENOL) tablet 650 mg  650 mg Oral Q6H PRN Bing Neighbors, NP   650 mg at 11/19/22 0939   albuterol (VENTOLIN HFA) 108 (90 Base) MCG/ACT inhaler 2 puff  2 puff Inhalation Q6H PRN Bing Neighbors, NP   2 puff at 11/18/22 1008   alteplase (CATHFLO ACTIVASE) injection 2 mg  2 mg Intracatheter Once PRN Wendee Beavers, NP       amLODipine (NORVASC) tablet 10 mg  10 mg Oral Daily Bing Neighbors, NP   10 mg at 11/19/22 8295   atorvastatin (LIPITOR) tablet 20 mg  20 mg Oral Daily Bing Neighbors, NP   20 mg at 11/19/22 6213   calcitRIOL (ROCALTROL) capsule 0.5 mcg  0.5 mcg Oral Daily Bing Neighbors, NP   0.5 mcg at 11/19/22 0939   carvedilol (COREG) tablet 12.5 mg  12.5 mg Oral BID WC Bing Neighbors, NP   12.5 mg at 11/19/22 0805   Chlorhexidine Gluconate Cloth 2 % PADS 6 each  6 each Topical Q0600 Wendee Beavers, NP       cyclobenzaprine (FLEXERIL) tablet 10 mg  10 mg Oral TID Sarina Ill, DO   10 mg at 11/19/22 0865   dicyclomine (BENTYL) capsule 10 mg  10 mg Oral TID Deneen Harts, NP   10 mg at 11/19/22 1145   DULoxetine (CYMBALTA) DR capsule 30 mg  30 mg Oral Daily Bing Neighbors, NP   30 mg at 11/19/22 0939   epoetin alfa (EPOGEN) injection 4,000 Units  4,000 Units Intravenous Q M,W,F-HD Wendee Beavers, NP   4,000 Units at 11/17/22 1010   escitalopram (LEXAPRO) tablet 20 mg  20 mg Oral Daily Bing Neighbors, NP   20 mg at 11/19/22 7846   ferrous sulfate tablet 325 mg  325 mg Oral Maryland Pink, NP   325 mg at 11/19/22 0938   fluticasone furoate-vilanterol (BREO ELLIPTA) 100-25 MCG/ACT 1 puff  1 puff Inhalation Daily Bing Neighbors, NP   1 puff at 11/19/22 0941   And   umeclidinium bromide (INCRUSE ELLIPTA) 62.5 MCG/ACT 1 puff  1 puff Inhalation Daily Bing Neighbors, NP   1 puff at 11/19/22 0942   folic acid (FOLVITE) tablet 1 mg  1 mg Oral Daily Bing Neighbors, NP   1 mg at 11/19/22 9629    haloperidol (HALDOL) tablet 5 mg  5 mg Oral TID PRN Bing Neighbors, NP   5 mg at 11/16/22 2216   Or   haloperidol lactate (HALDOL) injection 5 mg  5 mg Intramuscular TID PRN Bing Neighbors, NP       heparin injection 1,000 Units  1,000 Units Intracatheter PRN Wendee Beavers, NP   1,000 Units at 11/17/22 5284   hydrOXYzine (ATARAX) tablet 25 mg  25 mg Oral TID PRN Bing Neighbors, NP   25 mg at 11/17/22 1624   influenza vac split trivalent PF (FLULAVAL) injection 0.5 mL  0.5 mL Intramuscular Tomorrow-1000 Sarina Ill, DO       lamoTRIgine (LAMICTAL) tablet 100 mg  100 mg Oral Daily Bing Neighbors, NP   100 mg at 11/19/22 1324   loperamide (IMODIUM) capsule 2 mg  2 mg Oral PRN Lewanda Rife, MD   2 mg at 11/18/22 1038   loperamide (IMODIUM) capsule 2 mg  2 mg Oral TID Lewanda Rife, MD  2 mg at 11/19/22 0805   melatonin tablet 5 mg  5 mg Oral QHS Lewanda Rife, MD   5 mg at 11/18/22 2102   mirtazapine (REMERON) tablet 7.5 mg  7.5 mg Oral QHS Lewanda Rife, MD   7.5 mg at 11/18/22 2102   pantoprazole (PROTONIX) EC tablet 80 mg  80 mg Oral Daily Bing Neighbors, NP   80 mg at 11/19/22 1610   QUEtiapine (SEROQUEL) tablet 100 mg  100 mg Oral BID Lewanda Rife, MD   100 mg at 11/19/22 0939   torsemide (DEMADEX) tablet 20 mg  20 mg Oral BID Bing Neighbors, NP   20 mg at 11/19/22 9604    Lab Results:  No results found. However, due to the size of the patient record, not all encounters were searched. Please check Results Review for a complete set of results.    Blood Alcohol level:  Lab Results  Component Value Date   ETH <10 11/10/2022   ETH <10 06/21/2022    Metabolic Disorder Labs: Lab Results  Component Value Date   HGBA1C 5.0 01/05/2022   MPG 99.67 06/17/2021   MPG 105.41 07/23/2020   No results found for: "PROLACTIN" Lab Results  Component Value Date   CHOL 151 11/14/2022   TRIG 100 11/14/2022   HDL 65 11/14/2022    CHOLHDL 2.3 11/14/2022   VLDL 20 11/14/2022   LDLCALC 66 11/14/2022   LDLCALC 54 08/05/2018     Musculoskeletal: Strength & Muscle Tone: decreased Gait & Station: unable to stand in wheelchair Patient leans: N/A  Psychiatric Specialty Exam:  Presentation  General Appearance: Appropriate for Environment  Eye Contact:Fair  Speech:Clear and Coherent  Speech Volume:Normal  Mood and Affect  Mood: Fine Affect: Avoidant, not cooperative  Thought Process  Thought Processes:Coherent; Goal Directed  Descriptions of Associations:Intact  Orientation:Full (Time, Place and Person)  Thought Content:Abstract Reasoning; Rumination  History of Schizophrenia/Schizoaffective disorder:Yes  Duration of Psychotic Symptoms:Greater than six months  Hallucinations:Denies  Ideas of Reference:None  Suicidal Thoughts:Denies Homicidal Thoughts:Positive  Sensorium  Memory:Recent Fair  Judgment: Limited Insight: Improving  Executive Functions  Concentration:Fair  Attention Span:Fair   Language:Fair   Psychomotor Activity  Psychomotor Activity:Normal   Assets  Assets:Communication Skills; Desire for Improvement   Sleep  Sleep: Improved    Physical Exam: Physical Exam Constitutional:      Appearance: Normal appearance.  HENT:     Head: Normocephalic and atraumatic.     Nose: Nose normal.  Eyes:     Pupils: Pupils are equal, round, and reactive to light.  Cardiovascular:     Rate and Rhythm: Regular rhythm.  Pulmonary:     Effort: Pulmonary effort is normal.  Abdominal:     General: There is no distension.     Tenderness: There is no abdominal tenderness.  Neurological:     Mental Status: [name] alert.    Review of Systems  Constitutional:  Negative for chills and fever.  HENT:  Negative for hearing loss and sore throat.   Eyes:  Negative for blurred vision and double vision.  Respiratory:  Negative for cough and sputum production.   Cardiovascular:   Negative for chest pain and palpitations.  Gastrointestinal:  Negative for nausea and vomiting.  Musculoskeletal:  Positive for joint pain.  Neurological:  Negative for dizziness and speech change.   Blood pressure (!) 164/100, pulse 84, temperature 98.8 F (37.1 C), resp. rate 18, height 5\' 3"  (1.6 m), weight 84.3 kg, SpO2 100%.  Body mass index is 32.92 kg/m.   Treatment Plan Summary: Daily contact with patient to assess and evaluate symptoms and progress in treatment and Medication management  Increase the dose of quetiapine to 100 mg po BID 11/18/22 Cymbalta and Lexapro for depression and anxiety We will start on Remeron 7.5 mg at bedtime to help with depression and sleep, 11/17/22 Will continue on melatonin  5 mg at bedtime Loperamide 2 mg po TID, 11/18/22 Discontinue Lamictal on patient's request, 11/19/22  Lewanda Rife, MD

## 2022-11-19 NOTE — Progress Notes (Signed)
   11/19/22 2300  Psych Admission Type (Psych Patients Only)  Admission Status Voluntary  Psychosocial Assessment  Patient Complaints None  Eye Contact Fair  Facial Expression Animated  Affect Labile  Speech Logical/coherent  Interaction Assertive  Motor Activity Slow  Appearance/Hygiene In scrubs  Behavior Characteristics Appropriate to situation  Mood Labile  Thought Process  Coherency Circumstantial  Content Blaming others  Delusions WDL  Perception WDL  Hallucination None reported or observed  Judgment Impaired  Confusion None  Danger to Self  Current suicidal ideation? Denies  Danger to Others  Danger to Others None reported or observed  Danger to Others Abnormal  Harmful Behavior to others No threats or harm toward other people  Destructive Behavior No threats or harm toward property

## 2022-11-19 NOTE — Progress Notes (Signed)
   11/19/22 0556  15 Minute Checks  Location Bedroom  Visual Appearance Calm  Behavior Sleeping  Sleep (Behavioral Health Patients Only)  Calculate sleep? (Click Yes once per 24 hr at 0600 safety check) Yes  Documented sleep last 24 hours 10

## 2022-11-19 NOTE — Plan of Care (Signed)
  Problem: Education: Goal: Knowledge of [name] Education information/materials will improve Outcome: Progressing   Problem: Activity: Goal: Sleeping patterns will improve Outcome: Not Progressing   Problem: Coping: Goal: Ability to verbalize frustrations and anger appropriately will improve Outcome: Not Progressing

## 2022-11-19 NOTE — Group Note (Signed)
Date:  11/19/2022 Time:  10:57 PM  Group Topic/Focus:  Making Healthy Choices:   The focus of this group is to help patients identify negative/unhealthy choices they were using prior to admission and identify positive/healthier coping strategies to replace them upon discharge.    Participation Level:  Active  Participation Quality:  Appropriate  Affect:  Appropriate  Cognitive:  Appropriate  Insight: Appropriate  Engagement in Group:  Engaged  Modes of Intervention:  Discussion  Additional Comments:    Maeola Harman 11/19/2022, 10:57 PM

## 2022-11-20 ENCOUNTER — Encounter (HOSPITAL_COMMUNITY): Payer: Self-pay

## 2022-11-20 DIAGNOSIS — F4310 Post-traumatic stress disorder, unspecified: Secondary | ICD-10-CM | POA: Diagnosis not present

## 2022-11-20 DIAGNOSIS — F251 Schizoaffective disorder, depressive type: Secondary | ICD-10-CM | POA: Diagnosis not present

## 2022-11-20 LAB — CBC
HCT: 26.3 % — ABNORMAL LOW (ref 36.0–46.0)
Hemoglobin: 8 g/dL — ABNORMAL LOW (ref 12.0–15.0)
MCH: 29 pg (ref 26.0–34.0)
MCHC: 30.4 g/dL (ref 30.0–36.0)
MCV: 95.3 fL (ref 80.0–100.0)
Platelets: 324 10*3/uL (ref 150–400)
RBC: 2.76 MIL/uL — ABNORMAL LOW (ref 3.87–5.11)
RDW: 17.1 % — ABNORMAL HIGH (ref 11.5–15.5)
WBC: 5.8 10*3/uL (ref 4.0–10.5)
nRBC: 0 % (ref 0.0–0.2)

## 2022-11-20 LAB — RENAL FUNCTION PANEL
Albumin: 3.6 g/dL (ref 3.5–5.0)
Anion gap: 8 (ref 5–15)
BUN: 51 mg/dL — ABNORMAL HIGH (ref 8–23)
CO2: 27 mmol/L (ref 22–32)
Calcium: 8.8 mg/dL — ABNORMAL LOW (ref 8.9–10.3)
Chloride: 98 mmol/L (ref 98–111)
Creatinine, Ser: 4.05 mg/dL — ABNORMAL HIGH (ref 0.44–1.00)
GFR, Estimated: 12 mL/min — ABNORMAL LOW (ref >60)
Glucose, Bld: 75 mg/dL (ref 70–99)
Phosphorus: 4.7 mg/dL — ABNORMAL HIGH (ref 2.5–4.6)
Potassium: 4.6 mmol/L (ref 3.5–5.1)
Sodium: 133 mmol/L — ABNORMAL LOW (ref 135–145)

## 2022-11-20 MED ORDER — EPOETIN ALFA 10000 UNIT/ML IJ SOLN
10000.0000 [IU] | INTRAMUSCULAR | Status: DC
  Administered 2022-11-20 – 2022-11-22 (×2): 10000 [IU] via INTRAVENOUS

## 2022-11-20 MED ORDER — AMLODIPINE BESYLATE 5 MG PO TABS: 5.0000 mg | ORAL_TABLET | Freq: Every day | ORAL | Status: DC

## 2022-11-20 MED ORDER — AMLODIPINE BESYLATE 5 MG PO TABS: 5.0000 mg | ORAL_TABLET | Freq: Every evening | ORAL | Status: DC

## 2022-11-20 MED ORDER — AMLODIPINE BESYLATE 5 MG PO TABS
5.0000 mg | ORAL_TABLET | Freq: Every evening | ORAL | Status: DC
  Administered 2022-11-20 – 2022-11-21 (×2): 5 mg via ORAL
  Filled 2022-11-20 (×2): qty 1

## 2022-11-20 MED ORDER — EPOETIN ALFA 10000 UNIT/ML IJ SOLN
INTRAMUSCULAR | Status: AC
  Filled 2022-11-20: qty 1

## 2022-11-20 MED ORDER — QUETIAPINE FUMARATE 100 MG PO TABS
100.0000 mg | ORAL_TABLET | Freq: Every day | ORAL | Status: DC
  Administered 2022-11-21 – 2022-11-22 (×2): 100 mg via ORAL
  Filled 2022-11-20 (×2): qty 1

## 2022-11-20 MED ORDER — QUETIAPINE FUMARATE 200 MG PO TABS
200.0000 mg | ORAL_TABLET | Freq: Every day | ORAL | Status: DC
  Administered 2022-11-20 – 2022-11-21 (×2): 200 mg via ORAL
  Filled 2022-11-20 (×2): qty 1

## 2022-11-20 NOTE — Group Note (Signed)
Recreation Therapy Group Note   Group Topic:Coping Skills  Group Date: 11/20/2022 Start Time: 1400 End Time: 1455 Facilitators: Rosina Lowenstein, LRT, CTRS Location:  Dayroom  Group Description: Mind Map.  Patient was provided a blank template of a diagram with 32 blank boxes in a tiered system, branching from the center (similar to a bubble chart). LRT directed patients to label the middle of the diagram "Coping Skills". LRT and patients then came up with 8 different coping skills as examples. Pt were directed to record their coping skills in the 2nd tier boxes closest to the center.  Patients would then share their coping skills with the group as LRT wrote them out. LRT gave a handout of 99 different coping skills at the end of group.    Goal Area(s) Addressed: Patients will be able to define "coping skills". Patient will identify new coping skills.  Patient will increase communication.   Affect/Mood: N/A   Participation Level: Did not attend    Clinical Observations/Individualized Feedback: Tiffany [name] did not attend group.   Plan: Continue to engage patient in RT group sessions 2-3x/week.   Rosina Lowenstein, LRT, CTRS 11/20/2022 3:05 PM

## 2022-11-20 NOTE — Progress Notes (Signed)
   11/20/22 2100  Psych Admission Type (Psych Patients Only)  Admission Status Voluntary  Psychosocial Assessment  Patient Complaints None  Eye Contact Fair  Facial Expression Flat  Affect Labile  Speech Logical/coherent  Interaction Assertive  Motor Activity Slow  Appearance/Hygiene In scrubs  Behavior Characteristics Cooperative;Appropriate to situation  Mood Pleasant  Thought Process  Coherency Circumstantial  Content Blaming others  Delusions None reported or observed  Perception WDL  Hallucination None reported or observed  Judgment Impaired  Confusion None  Danger to Self  Current suicidal ideation? Denies  Danger to Others  Danger to Others None reported or observed  Danger to Others Abnormal  Harmful Behavior to others No threats or harm toward other people  Destructive Behavior No threats or harm toward property

## 2022-11-20 NOTE — Progress Notes (Signed)
 Medications will be held until after dialysis.  Pt to receive dialysis 2nd shift today.

## 2022-11-20 NOTE — Plan of Care (Signed)
  Problem: Safety: Goal: Ability to remain free from injury will improve Outcome: Progressing   Problem: Skin Integrity: Goal: Risk for impaired skin integrity will decrease Outcome: Not Progressing

## 2022-11-20 NOTE — BH IP Treatment Plan (Signed)
Interdisciplinary Treatment and Diagnostic Plan Update  11/20/2022 Time of Session: 9:30 AM  Tiffany [name] MRN: [id number]  Principal Diagnosis: Schizoaffective disorder [facility])  Secondary Diagnoses: Principal Problem:   Schizoaffective disorder (HCC)   Current Medications:  Current Facility-Administered Medications  Medication Dose Route Frequency Provider Last Rate Last Admin   acetaminophen (TYLENOL) tablet 650 mg  650 mg Oral Q6H PRN Bing Neighbors, NP   650 mg at 11/19/22 2032   albuterol (VENTOLIN HFA) 108 (90 Base) MCG/ACT inhaler 2 puff  2 puff Inhalation Q6H PRN Bing Neighbors, NP   2 puff at 11/18/22 1008   alteplase (CATHFLO ACTIVASE) injection 2 mg  2 mg Intracatheter Once PRN Wendee Beavers, NP       amLODipine (NORVASC) tablet 10 mg  10 mg Oral Daily Bing Neighbors, NP   10 mg at 11/19/22 1027   atorvastatin (LIPITOR) tablet 20 mg  20 mg Oral Daily Bing Neighbors, NP   20 mg at 11/19/22 2536   calcitRIOL (ROCALTROL) capsule 0.5 mcg  0.5 mcg Oral Daily Bing Neighbors, NP   0.5 mcg at 11/19/22 0939   carvedilol (COREG) tablet 12.5 mg  12.5 mg Oral BID WC Bing Neighbors, NP   12.5 mg at 11/19/22 1619   Chlorhexidine Gluconate Cloth 2 % PADS 6 each  6 each Topical Q0600 Wendee Beavers, NP       cyclobenzaprine (FLEXERIL) tablet 10 mg  10 mg Oral TID Sarina Ill, DO   10 mg at 11/19/22 2033   dicyclomine (BENTYL) capsule 10 mg  10 mg Oral TID Deneen Harts, NP   10 mg at 11/20/22 1230   DULoxetine (CYMBALTA) DR capsule 30 mg  30 mg Oral Daily Bing Neighbors, NP   30 mg at 11/19/22 0939   epoetin alfa (EPOGEN) injection 4,000 Units  4,000 Units Intravenous Q M,W,F-HD Wendee Beavers, NP   4,000 Units at 11/17/22 1010   escitalopram (LEXAPRO) tablet 20 mg  20 mg Oral Daily Bing Neighbors, NP   20 mg at 11/19/22 6440   ferrous sulfate tablet 325 mg  325 mg Oral [place] Pink, NP   325 mg at 11/19/22 0938    fluticasone furoate-vilanterol (BREO ELLIPTA) 100-25 MCG/ACT 1 puff  1 puff Inhalation Daily Bing Neighbors, NP   1 puff at 11/19/22 0941   And   umeclidinium bromide (INCRUSE ELLIPTA) 62.5 MCG/ACT 1 puff  1 puff Inhalation Daily Bing Neighbors, NP   1 puff at 11/19/22 0942   folic acid (FOLVITE) tablet 1 mg  1 mg Oral Daily Bing Neighbors, NP   1 mg at 11/19/22 3474   haloperidol (HALDOL) tablet 5 mg  5 mg Oral TID PRN Bing Neighbors, NP   5 mg at 11/16/22 2216   Or   haloperidol lactate (HALDOL) injection 5 mg  5 mg Intramuscular TID PRN Bing Neighbors, NP       heparin injection 1,000 Units  1,000 Units Intracatheter PRN Wendee Beavers, NP   1,000 Units at 11/17/22 0742   hydrOXYzine (ATARAX) tablet 25 mg  25 mg Oral TID PRN Bing Neighbors, NP   25 mg at 11/17/22 1624   influenza vac split trivalent PF (FLULAVAL) injection 0.5 mL  0.5 mL Intramuscular Tomorrow-1000 Sarina Ill, DO       loperamide (IMODIUM) capsule 2 mg  2 mg Oral PRN Lewanda Rife, MD   2 mg  at 11/18/22 1038   loperamide (IMODIUM) capsule 2 mg  2 mg Oral TID Lewanda Rife, MD   2 mg at 11/20/22 1228   melatonin tablet 5 mg  5 mg Oral QHS Lewanda Rife, MD   5 mg at 11/19/22 2033   mirtazapine (REMERON) tablet 7.5 mg  7.5 mg Oral QHS Lewanda Rife, MD   7.5 mg at 11/19/22 2032   pantoprazole (PROTONIX) EC tablet 80 mg  80 mg Oral Daily Bing Neighbors, NP   80 mg at 11/19/22 6433   QUEtiapine (SEROQUEL) tablet 100 mg  100 mg Oral BID Lewanda Rife, MD   100 mg at 11/19/22 2032   torsemide (DEMADEX) tablet 20 mg  20 mg Oral BID Bing Neighbors, NP   20 mg at 11/19/22 1619   PTA Medications: Medications Prior to Admission  Medication Sig Dispense Refill Last Dose   albuterol (VENTOLIN HFA) 108 (90 Base) MCG/ACT inhaler INHALE 2 PUFFS INTO THE LUNGS EVERY 6 HOURS AS NEEDED FOR WHEEZING OR SHORTNESS OF BREATH 8.5 g 2    amLODipine (NORVASC) 10 MG tablet Take 1  tablet (10 mg total) by mouth daily. 30 tablet 5    atorvastatin (LIPITOR) 20 MG tablet Take 1 tablet (20 mg total) by mouth daily. 30 tablet 5    calcitRIOL (ROCALTROL) 0.5 MCG capsule Take 1 capsule (0.5 mcg total) by mouth daily. 30 capsule 5    carvedilol (COREG) 12.5 MG tablet Take 1 tablet (12.5 mg total) by mouth 2 (two) times daily. 60 tablet 5    cyclobenzaprine (FLEXERIL) 10 MG tablet Take 1 tablet (10 mg total) by mouth 3 (three) times daily. 90 tablet 5    dicyclomine (BENTYL) 10 MG capsule Take 1 capsule (10 mg total) by mouth 3 (three) times daily before meals. 90 capsule 5    DULoxetine (CYMBALTA) 30 MG capsule Take 1 capsule (30 mg total) by mouth daily. 30 capsule 5    escitalopram (LEXAPRO) 20 MG tablet Take 1 tablet (20 mg total) by mouth daily. 30 tablet 5    ezetimibe (ZETIA) 10 MG tablet Take 1 tablet (10 mg total) by mouth daily. 30 tablet 5    ferrous sulfate 325 (65 FE) MG tablet Take 1 tablet (325 mg total) by mouth every other day. 30 tablet 5    Fluticasone-Umeclidin-Vilant (TRELEGY ELLIPTA) 100-62.5-25 MCG/ACT AEPB INHALE 1 PUFF INTO THE LUNGS DAILY 60 each 0    folic acid (FOLVITE) 1 MG tablet Take 1 tablet (1 mg total) by mouth daily. 30 tablet 5    ibuprofen (ADVIL) 200 MG tablet Take 400-600 mg by mouth daily.      lamoTRIgine (LAMICTAL) 100 MG tablet Take 1 tablet (100 mg total) by mouth daily. 30 tablet 5    loperamide (IMODIUM) 2 MG capsule Take 1 capsule (2 mg total) by mouth 2 (two) times daily. 30 capsule 5    nystatin (MYCOSTATIN) 100000 UNIT/ML suspension Take 5 mLs (500,000 Units total) by mouth 4 (four) times daily. 60 mL 0    OLANZapine (ZYPREXA) 10 MG tablet Take 1 tablet (10 mg total) by mouth at bedtime. 30 tablet 5    omeprazole (PRILOSEC) 40 MG capsule Take 1 capsule (40 mg total) by mouth every morning. 30 capsule 5    potassium chloride SA (KLOR-CON M) 20 MEQ tablet Take 20 mEq by mouth See admin instructions. Take 1 tablet by mouth for 3 days       potassium chloride SA (KLOR-CON M)  20 MEQ tablet Take 1 tablet (20 mEq total) by mouth 2 (two) times daily for 5 days. 10 tablet 0    potassium chloride SA (KLOR-CON M) 20 MEQ tablet Take 2 tablets (40 mEq total) by mouth daily for 5 days. 10 tablet 0    prazosin (MINIPRESS) 2 MG capsule Take 1 capsule (2 mg total) by mouth at bedtime. 30 capsule 5    SAPHRIS 10 MG SUBL Place 1 tablet (10 mg total) under the tongue daily. 30 tablet 5    torsemide (DEMADEX) 20 MG tablet Take 1 tablet (20 mg total) by mouth 2 (two) times daily. 60 tablet 2     Patient Stressors: Health problems   Marital or family conflict    Patient Strengths: Ability for Contractor for treatment/growth   Treatment Modalities: Medication Management, Group therapy, Case management,  1 to 1 session with clinician, Psychoeducation, Recreational therapy.   Physician Treatment Plan for Primary Diagnosis: Schizoaffective disorder (HCC) Long Term Goal(s): Improvement in symptoms so as ready for discharge   Short Term Goals: Ability to identify changes in lifestyle to reduce recurrence of condition will improve Ability to verbalize feelings will improve Ability to disclose and discuss suicidal ideas Ability to demonstrate self-control will improve Ability to identify and develop effective coping behaviors will improve Ability to maintain clinical measurements within normal limits will improve Compliance with prescribed medications will improve Ability to identify triggers associated with substance abuse/mental health issues will improve  Medication Management: Evaluate patient's response, side effects, and tolerance of medication regimen.  Therapeutic Interventions: 1 to 1 sessions, Unit Group sessions and Medication administration.  Evaluation of Outcomes: Progressing  Physician Treatment Plan for Secondary Diagnosis: Principal Problem:   Schizoaffective disorder (HCC)  Long Term  Goal(s): Improvement in symptoms so as ready for discharge   Short Term Goals: Ability to identify changes in lifestyle to reduce recurrence of condition will improve Ability to verbalize feelings will improve Ability to disclose and discuss suicidal ideas Ability to demonstrate self-control will improve Ability to identify and develop effective coping behaviors will improve Ability to maintain clinical measurements within normal limits will improve Compliance with prescribed medications will improve Ability to identify triggers associated with substance abuse/mental health issues will improve     Medication Management: Evaluate patient's response, side effects, and tolerance of medication regimen.  Therapeutic Interventions: 1 to 1 sessions, Unit Group sessions and Medication administration.  Evaluation of Outcomes: Progressing   RN Treatment Plan for Primary Diagnosis: Schizoaffective disorder (HCC) Long Term Goal(s): Knowledge of disease and therapeutic regimen to maintain health will improve  Short Term Goals: Ability to remain free from injury will improve, Ability to verbalize frustration and anger appropriately will improve, Ability to demonstrate self-control, Ability to participate in decision making will improve, Ability to verbalize feelings will improve, Ability to disclose and discuss suicidal ideas, Ability to identify and develop effective coping behaviors will improve, and Compliance with prescribed medications will improve  Medication Management: RN will administer medications as ordered by provider, will assess and evaluate patient's response and provide education to patient for prescribed medication. RN will report any adverse and/or side effects to prescribing provider.  Therapeutic Interventions: 1 on 1 counseling sessions, Psychoeducation, Medication administration, Evaluate responses to treatment, Monitor vital signs and CBGs as ordered, Perform/monitor CIWA, COWS,  AIMS and Fall Risk screenings as ordered, Perform wound care treatments as ordered.  Evaluation of Outcomes: Progressing   LCSW Treatment Plan for Primary Diagnosis:  Schizoaffective disorder [facility]) Long Term Goal(s): Safe transition to appropriate next level of care at discharge, Engage patient in therapeutic group addressing interpersonal concerns.  Short Term Goals: Engage patient in aftercare planning with referrals and resources, Increase social support, Increase ability to appropriately verbalize feelings, Increase emotional regulation, Facilitate acceptance of mental health diagnosis and concerns, Facilitate patient progression through stages of change regarding substance use diagnoses and concerns, Identify triggers associated with mental health/substance abuse issues, and Increase skills for wellness and recovery  Therapeutic Interventions: Assess for all discharge needs, 1 to 1 time with Social worker, Explore available resources and support systems, Assess for adequacy in community support network, Educate family and significant other(s) on suicide prevention, Complete Psychosocial Assessment, Interpersonal group therapy.  Evaluation of Outcomes: Progressing   Progress in Treatment: Attending groups: Yes. and No. Participating in groups: Yes. and No. Taking medication as prescribed: Yes. Toleration medication: Yes. Family/Significant other contact made: No, will contact:  [name] will contact if given permission  Patient understands diagnosis: Yes. Discussing patient identified problems/goals with staff: Yes. Medical problems stabilized or resolved: Yes. Denies suicidal/homicidal ideation: Yes. Issues/concerns per patient self-inventory: No. Other: Other   New problem(s) identified: No, Describe:  None identified Update 11/20/22: No changes at this time    New Short Term/Long Term Goal(s): elimination of symptoms of psychosis, medication management for mood stabilization;  elimination of SI thoughts; development of comprehensive mental wellness plan.    Patient Goals:  " I haven't had a clear enough mind to set a goal yet" Update 11/20/22: No changes at this time   Discharge Plan or Barriers: [name] will assist with appropriate discharge planning Update 11/20/22: Pt reports she wants a disability friendly apartment. Pt wants the apartment in [place]. [name] has informed pt on multiple occasions that [name] does not do housing, and that pt needs to discharge home and work on looking for an apartment.    Reason for Continuation of Hospitalization: Depression Homicidal ideation Medication stabilization   Estimated Length of Stay: 1 to 7 days Update 11/20/22: TBD  Last 3 [place] Suicide Severity Risk Score: Flowsheet Row Admission (Current) from 11/10/2022 in [facility] Most recent reading at 11/10/2022  5:00 PM ED from 11/10/2022 in [facility] Emergency Department at [facility] Most recent reading at 11/10/2022 10:39 AM ED from 11/07/2022 in [facility] Emergency Department at [facility] Most recent reading at 11/07/2022 12:56 PM  C-SSRS RISK CATEGORY No Risk No Risk No Risk       Last PHQ 2/9 Scores:    07/11/2022    1:42 PM 06/09/2022    3:51 PM 04/03/2022    1:40 PM  Depression screen PHQ 2/9  Decreased Interest 1 0   Down, Depressed, Hopeless  0 0  PHQ - 2 Score 1 0 0  Altered sleeping 0 0 3  Tired, decreased energy 1 3 1   Change in appetite 0 0 2  Feeling bad or failure about yourself  0 0 0  Trouble concentrating  0 0  Moving slowly or fidgety/restless 0 0 0  Suicidal thoughts 0 0 0  PHQ-9 Score 2 3 6   Difficult doing work/chores Not difficult at all Somewhat difficult     Scribe for Treatment Team: Elza Rafter, Theresia Majors 11/20/2022 2:34 PM

## 2022-11-20 NOTE — Progress Notes (Signed)
 Patient refused to allow this RN to perform wound care.

## 2022-11-20 NOTE — Group Note (Signed)
Date:  11/20/2022 Time:  2:31 PM  Group Topic/Focus:  Identifying Needs:   The focus of this group is to help patients identify their personal needs that have been historically problematic and identify healthy behaviors to address their needs. Managing Feelings:   The focus of this group is to identify what feelings patients have difficulty handling and develop a plan to handle them in a healthier way upon discharge. Wellness Toolbox:   The focus of this group is to discuss various aspects of wellness, balancing those aspects and exploring ways to increase the ability to experience wellness.  Patients will create a wellness toolbox for use upon discharge.    Participation Level:  Active  Participation Quality:  Appropriate, Attentive, Sharing, and Supportive  Affect:  Appropriate  Cognitive:  Alert, Appropriate, and Oriented  Insight: Appropriate and Good  Engagement in Group:  Engaged  Modes of Intervention:  Activity  Additional Comments:     Alexis Frock 11/20/2022, 2:31 PM

## 2022-11-20 NOTE — Progress Notes (Signed)
   11/20/22 0615  15 Minute Checks  Location Bedroom  Visual Appearance Calm  Behavior Sleeping  Sleep (Behavioral Health Patients Only)  Calculate sleep? (Click Yes once per 24 hr at 0600 safety check) Yes  Documented sleep last 24 hours 7.25

## 2022-11-20 NOTE — Telephone Encounter (Signed)
Not a PEC provider Requested Prescriptions  Pending Prescriptions Disp Refills   potassium chloride SA (KLOR-CON M) 20 MEQ tablet [Pharmacy Med Name: POTASSIUM CL ER TABLETS] 30 tablet     Sig: TAKE 1 TABLET BY MOUTH EVERY DAY     Endocrinology:  Minerals - Potassium Supplementation Failed - 11/19/2022  3:58 AM      Failed - Cr in normal range and within 360 days    Creat  Date Value Ref Range Status  02/18/2016 1.22 (H) 0.50 - 1.05 mg/dL Final    Comment:      For patients > or = 63 years of age: The upper reference limit for Creatinine is approximately 13% higher for people identified as African-American.      Creatinine, Ser  Date Value Ref Range Status  11/17/2022 3.25 (H) 0.44 - 1.00 mg/dL Final   Creatinine,U  Date Value Ref Range Status  06/17/2012 521.94 mg/dL Final    Comment:    Result confirmed by automatic dilution.    Cutoff Values for Urine Drug Screen, Pain Mgmt          Drug Class           Cutoff (ng/mL)          Amphetamines             500          Barbiturates             200          Cocaine Metabolites      150          Benzodiazepines          200          Methadone                300          Opiates                  300          Phencyclidine             25          Propoxyphene             300          Marijuana Metabolites     50    For medical purposes only.   Creatinine, Urine  Date Value Ref Range Status  06/05/2022 48 mg/dL Final         Failed - Valid encounter within last 12 months    Recent Outpatient Visits           1 year ago Frequent falls   [place] [facility] [place]. [facility] Hoy Register, MD   1 year ago Lumbar radiculopathy   [place] [facility] [place]. [facility] Hoy Register, MD   1 year ago Weakness of both lower extremities   [facility] [place]. [facility] Hoy Register, MD   1  year ago Edema of both legs   [facility] [place]. [facility] Claiborne Rigg, NP   1 year ago Hospital discharge follow-up    [facility] [place]. [facility] Claiborne Rigg, [place]  Passed - K in normal range and within 360 days    Potassium  Date Value Ref Range Status  11/17/2022 3.8 3.5 - 5.1 mmol/L Final

## 2022-11-20 NOTE — Progress Notes (Signed)
 Patient with flat affect.  Present in the dayroom for breakfast.  Patient stated she was unable to answer assessment questions.  Reports she slept well.  Good appetite.  Patient able to wheel herself around unit in wheelchair.  Medications will be held until after dialysis.  15 min checks in place for safety.   Patient refuses to wear oxygen (3L).  Sats at 100% on room air.   Compliant with scheduled medications upon returning to unit.

## 2022-11-20 NOTE — Progress Notes (Signed)
[facility]  ROUNDING NOTE   Subjective:   Patient seen and evaluated during dialysis   HEMODIALYSIS FLOWSHEET:  Blood Flow Rate (mL/min): 400 mL/min Arterial Pressure (mmHg): -160 mmHg Venous Pressure (mmHg): 140 mmHg TMP (mmHg): 14 mmHg Ultrafiltration Rate (mL/min): 543 mL/min Dialysate Flow Rate (mL/min): 300 ml/min Dialysis Fluid Bolus: Normal Saline Bolus Amount (mL): 100 mL  Tolerating treatment well seated in chair No complaints to offer  Objective:  Vital signs in last 24 hours:  Temp:  [97.7 F (36.5 C)-98.3 F (36.8 C)] 98.3 F (36.8 C) (11/04 1345) Pulse Rate:  [70-85] 77 (11/04 1445) Resp:  [15-18] 17 (11/04 1445) BP: (110-154)/(69-93) 144/86 (11/04 1445) SpO2:  [100 %] 100 % (11/04 1345) Weight:  [88.9 kg] 88.9 kg (11/04 1340)  Weight change:  Filed Weights   11/15/22 0757 11/15/22 1244 11/20/22 1340  Weight: 85.5 kg 84.3 kg 88.9 kg    Intake/Output: No intake/output data recorded.   Intake/Output this shift:  No intake/output data recorded.  Physical Exam: General: NAD, sitting in chair  Head: Normocephalic, atraumatic. Moist oral mucosal membranes  Eyes: Anicteric  Lungs:  Clear to auscultation, room air  Heart: Regular rate and rhythm  Abdomen:  Soft, nontender  Extremities:  1+ peripheral edema.  Neurologic: Alert moving all four extremities  Skin: No lesions  Access: Rt chest Permcath    Basic Metabolic Panel: Recent Labs  Lab 11/17/22 0744 11/20/22 1413  NA 135 133*  K 3.8 4.6  CL 98 98  CO2 28 27  GLUCOSE 96 75  BUN 32* 51*  CREATININE 3.25* 4.05*  CALCIUM 9.5 8.8*  PHOS 3.7 4.7*    Liver Function Tests: Recent Labs  Lab 11/17/22 0744 11/20/22 1413  ALBUMIN 3.2* 3.6   No results for input(s): "LIPASE", "AMYLASE" in the last 168 hours. No results for input(s): "AMMONIA" in the last 168 hours.  CBC: Recent Labs  Lab 11/17/22 0744 11/20/22 1412  WBC 7.1 5.8  HGB 8.0* 8.0*  HCT 25.6* 26.3*  MCV  92.4 95.3  PLT 319 324    Cardiac Enzymes: No results for input(s): "CKTOTAL", "CKMB", "CKMBINDEX", "TROPONINI" in the last 168 hours.  BNP: Invalid input(s): "POCBNP"  CBG: Recent Labs  Lab 11/13/22 2040  GLUCAP 91    Microbiology: Results for orders placed or performed during the hospital encounter of 11/07/22  SARS Coronavirus 2 by RT PCR (hospital order, performed in [facility] hospital lab) *cepheid single result test* Anterior Nasal Swab     Status: None   Collection Time: 11/07/22  1:51 PM   Specimen: Anterior Nasal Swab  Result Value Ref Range Status   SARS Coronavirus 2 by RT PCR NEGATIVE NEGATIVE Final    Comment: Performed at [facility], [address] [place], [place] [address]   *Note: Due to a large number of results and/or encounters for the requested time period, some results have not been displayed. A complete set of results can be found in Results Review.    Coagulation Studies: No results for input(s): "LABPROT", "INR" in the last 72 hours.  Urinalysis: No results for input(s): "COLORURINE", "LABSPEC", "PHURINE", "GLUCOSEU", "HGBUR", "BILIRUBINUR", "KETONESUR", "PROTEINUR", "UROBILINOGEN", "NITRITE", "LEUKOCYTESUR" in the last 72 hours.  Invalid input(s): "APPERANCEUR"    Imaging: No results found.   Medications:     amLODipine  10 mg Oral Daily   atorvastatin  20 mg Oral Daily   calcitRIOL  0.5 mcg Oral Daily   carvedilol  12.5 mg Oral  BID WC   Chlorhexidine Gluconate Cloth  6 each Topical Q0600   cyclobenzaprine  10 mg Oral TID   dicyclomine  10 mg Oral TID AC   DULoxetine  30 mg Oral Daily   epoetin (EPOGEN/PROCRIT) injection  4,000 Units Intravenous Q M,W,F-HD   escitalopram  20 mg Oral Daily   ferrous sulfate  325 mg Oral QODAY   fluticasone furoate-vilanterol  1 puff Inhalation Daily   And   umeclidinium bromide  1 puff Inhalation Daily   folic acid  1 mg Oral Daily   influenza vac split trivalent PF  0.5 mL Intramuscular  Tomorrow-1000   loperamide  2 mg Oral TID   melatonin  5 mg Oral QHS   mirtazapine  7.5 mg Oral QHS   pantoprazole  80 mg Oral Daily   [START ON 11/21/2022] QUEtiapine  100 mg Oral Daily   QUEtiapine  200 mg Oral QHS   torsemide  20 mg Oral BID   acetaminophen, albuterol, alteplase, haloperidol **OR** haloperidol lactate, heparin, hydrOXYzine, loperamide  Assessment/ Plan:  [name] is a 62 y.o.  female  with an extensive past medical history including schizophrenia, hypertension, CHF, diabetes, stroke, and end-stage renal disease on hemodialysis. [name] currently admitted for Schizoaffective disorder [facility]) [F25.9]   End-stage renal disease on hemodialysis.  Receiving dialysis today, UF goal 1 L as tolerated.  Patient states she is at her normal state of edema.  Next treatment scheduled for Wednesday.  2. Anemia of chronic kidney disease Lab Results  Component Value Date   HGB 8.0 (L) 11/20/2022    Hgb below desired range.Will continue EPO 10000 units with dialysis treatments.   3. Secondary Hyperparathyroidism: with outpatient labs: none available Lab Results  Component Value Date   PTH 57 02/18/2020   CALCIUM 8.8 (L) 11/20/2022   CAION 1.16 06/02/2022   PHOS 4.7 (H) 11/20/2022    Calcium and phosphorus within desired range.  Continue calcitriol  4.  Hypertension with chronic kidney disease.  Home regimen includes amlodipine, carvedilol, and torsemide.  Currently receiving these medications.    Blood pressure 149/90 during dialysis   LOS: 10 Nico Rogness 11/4/20243:53 PM
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
## 2022-11-21 DIAGNOSIS — F250 Schizoaffective disorder, bipolar type: Secondary | ICD-10-CM | POA: Diagnosis not present

## 2022-11-21 NOTE — Plan of Care (Signed)
Has remained in the milieu, pleasant and cooperative. Attended group, had a snack and received HS medications.

## 2022-11-21 NOTE — Group Note (Signed)
Recreation Therapy Group Note   Group Topic:Communication  Group Date: 11/21/2022 Start Time: 1400 End Time: 1450 Facilitators: Rosina Lowenstein, LRT, CTRS Location: Courtyard  Group Description: Merchant navy officer. Patients drew a laminated card out of a bag that had a word or phrase on it. Pt encouraged to speak about a time in their life or fond memory that specifically relates to the word they chose out of the bag. An example would be: "parenthood, meals, siblings, travel, or home".  LRT prompted following questions and encouraged contribution from peers to increase communication.   Goal Area(s) Addressed: Patient will increase verbal communication by conversing with peers. Patient will contribute to group discussion with minimal prompting. Patient will reminisce a positive memory or moment in their life.   Affect/Mood: N/A   Participation Level: Did not attend    Clinical Observations/Individualized Feedback: Catalyna did not attend group.   Plan: Continue to engage patient in RT group sessions 2-3x/week.   Rosina Lowenstein, LRT, CTRS 11/21/2022 3:09 PM

## 2022-11-21 NOTE — Plan of Care (Signed)
  Problem: Nutrition: Goal: Adequate nutrition will be maintained Outcome: Progressing   Problem: Pain Management: Goal: General experience of comfort will improve Outcome: Progressing   Problem: Skin Integrity: Goal: Risk for impaired skin integrity will decrease Outcome: Not Progressing
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
## 2022-11-21 NOTE — Progress Notes (Signed)
 Patient with appropriate affect this morning. Present in the dayroom for breakfast.  Reports she slept well.  Patient stated she is not in touch with her feelings, but denies SI/HI and AVH.  Denied anxiety and depression. Present in the milieu.  Appropriate interaction with staff and peers.  Patient able to wheel herself in wheelchair.  Minimal assistance needed with ADLS.   Compliant with scheduled medications.  15 min checks in place for safety.    Nurse manager on unit to speak with patient today.

## 2022-11-21 NOTE — Progress Notes (Signed)
[facility], [facility], [facility] MD Progress Note  11/21/2022 1:31 PM Tiffany [name]  MRN:  [id number] Subjective: Tiffany [name] is seen on rounds.  She has been compliant with medications.  No side effects.  She states that she does not want to return home because she is not getting along with her family.  She wants to go to [place].  I asked her how she was going to get there and she said she did not know.  She does not know where she is going to live either.  She needs dialysis so that is a problem. Principal Problem: Schizoaffective disorder (HCC) Diagnosis: Principal Problem:   Schizoaffective disorder (HCC)  Total Time spent with patient: 15 minutes  Past Psychiatric History: Cluster B traits  Past Medical History:  Past Medical History:  Diagnosis Date   Abdominal pain    Acute encephalopathy 11/21/2017   Acute exacerbation of CHF (congestive heart failure) (HCC) 03/03/2022   Acute GI bleeding 03/29/2021   Acute kidney injury superimposed on chronic kidney disease (HCC) 02/18/2020   Acute metabolic encephalopathy 02/20/2020   Acute on chronic diastolic heart failure (HCC) 02/04/2022   Acute on chronic renal failure (HCC) 06/02/2022   Aggressive behavior    Agitation 11/22/2017   Anoxic brain injury (HCC) 09/08/2016   C. Arrest due to respiratory failure and COPD exacerbation   Anxiety    Arthritis    "all over" (04/10/2016)   Asthma 10/18/2010   Binge eating disorder    Blood loss anemia 04/24/2011   CAP (community acquired pneumonia) 06/22/2015   Cardiac arrest (HCC) 09/08/2016   PEA   Carotid artery stenosis    1-39% bilateral by dopplers 11/2016   Chronic diastolic (congestive) heart failure (HCC)    Chronic kidney disease, stage 3 (HCC)    Chronic kidney disease, stage 3b (HCC) 06/06/2021   Chronic pain syndrome 06/18/2012   Chronic post-traumatic stress disorder (PTSD) 05/27/2018   Chronic respiratory failure with hypoxia and hypercapnia (HCC) 06/22/2015   TRILOGY Vent >AVAPA-ES., Vt target  200-400, Max P 30 , PS max 20 , PS min 6-10 , E Max 6, E Min 4, Rate Auto AVAPS Rate 2 (titrate for pt comfort) , bleed O2 at 5l/m continuous flow .    CKD (chronic kidney disease) 02/03/2022   Closed displaced fracture of fifth metacarpal bone 03/21/2018   Cocaine use disorder, severe, in sustained remission (HCC) 12/17/2015   Complication of anesthesia    decreased bp, decreased heart rate   COPD (chronic obstructive pulmonary disease) (HCC) 07/08/2014   Delusional disorder, persecutory type (HCC) 06/26/2021   Depression    Diabetic neuropathy (HCC) 04/24/2011   Difficulty with speech 01/24/2018   Disorder of nervous system    Drug abuse (HCC) 11/21/2017   Dyslipidemia 04/24/2011   E. coli UTI 02/20/2020   Elevated troponin 04/28/2012   Emphysema    Encephalopathy 11/21/2017   Essential hypertension 03/22/2016   Facet arthropathy, lumbar 06/12/2022   Fibula fracture 07/10/2016   Frequent falls 10/11/2017   GERD (gastroesophageal reflux disease)    GI bleed 03/30/2021   Gout 04/11/2017   Heart attack (HCC) 1980s   Hematochezia    History of blood transfusion 1994   "couldn't stop bleeding from my period"   History of drug abuse in remission (HCC) 11/28/2015   Quit in 2017   Hyperlipidemia 03/31/2021   Hyperlipidemia LDL goal <70    Hypocalcemia    Hypokalemia    Hypomagnesemia    Incontinence    Manic  depression (HCC)    Morbid obesity (HCC) 10/18/2010   Obstructive sleep apnea 10/18/2010   On home oxygen therapy    "6L; 24/7" (04/10/2016)   OSA on CPAP    "wear mask sometimes" (04/10/2016)   Painless rectal bleeding 12/31/2015   Paranoid (HCC)    "sometimes; I'm on RX for it" (04/10/2016)   Periumbilical abdominal pain    Prolonged Q-T interval on ECG    Rectal bleeding 12/31/2015   Recurrent syncope 07/09/2021   Rhabdomyolysis 06/16/2021   Schizoaffective disorder, bipolar type (HCC) 04/05/2018   Seasonal allergies    Seborrheic keratoses 12/31/2013   Seizures  (HCC)    "don't know what kind; last one was ~ 1 yr ago" (04/10/2016)   Sinus bradycardia    Skin ulcer of sacrum, limited to breakdown of skin (HCC) 03/08/2022   Spondylolisthesis at L3-L4 level 06/12/2022   Stroke (HCC) 1980s   denies residual on 04/10/2016   Thrush 09/19/2013   Type 2 diabetes mellitus (HCC) 10/18/2010    Past Surgical History:  Procedure Laterality Date   CESAREAN SECTION  1997   COLONOSCOPY WITH PROPOFOL N/A 04/01/2021   Procedure: COLONOSCOPY WITH PROPOFOL;  Surgeon: Jeani Hawking, MD;  Location: [facility] ENDOSCOPY;  Service: Gastroenterology;  Laterality: N/A;  Rectal bleeding with drop in hemoglobin to 7.2 g/dL   HERNIA REPAIR     IR CHOLANGIOGRAM EXISTING TUBE  07/20/2016   IR PERC CHOLECYSTOSTOMY  05/10/2016   IR RADIOLOGIST EVAL & MGMT  06/08/2016   IR RADIOLOGIST EVAL & MGMT  06/29/2016   IR SINUS/FIST TUBE CHK-NON GI  07/12/2016   RIGHT/LEFT HEART CATH AND CORONARY ANGIOGRAPHY N/A 06/19/2017   Procedure: RIGHT/LEFT HEART CATH AND CORONARY ANGIOGRAPHY;  Surgeon: Dolores Patty, MD;  Location: [facility] INVASIVE CV LAB;  Service: Cardiovascular;  Laterality: N/A;   TIBIA IM NAIL INSERTION Right 07/12/2016   Procedure: INTRAMEDULLARY (IM) NAIL RIGHT TIBIA;  Surgeon: Tarry Kos, MD;  Location: [facility] OR;  Service: Orthopedics;  Laterality: Right;   UMBILICAL HERNIA REPAIR  ~ 1963   "that's why I don't have a belly button"   VAGINAL HYSTERECTOMY     Family History:  Family History  Problem Relation Age of Onset   Cancer Mother        lung   Depression Mother    Cancer Father        prostate   Depression Sister    Anxiety disorder Sister    Schizophrenia Sister    Bipolar disorder Sister    Depression Sister    Depression Brother    Heart failure Other        cousin   Family Psychiatric  History: Unremarkable Social History:  Social History   Substance and Sexual Activity  Alcohol Use No   Alcohol/week: 0.0 standard drinks of alcohol     Social History    Substance and Sexual Activity  Drug Use Not Currently   Types: Cocaine   Comment: 04/10/2016 "last used cocaine back in November 2017"    Social History   Socioeconomic History   Marital status: Widowed    Spouse name: Not on file   Number of children: 3   Years of education: 12th   Highest education level: 12th grade  Occupational History   Occupation: disabled    Comment: factory production  Tobacco Use   Smoking status: Former    Current packs/day: 0.00    Average packs/day: 1.5 packs/day for 39.1 years (58.6 ttl pk-yrs)  Types: Cigarettes    Start date: 03/13/1977    Quit date: 04/10/2016    Years since quitting: 6.6    Passive exposure: Past   Smokeless tobacco: Never  Vaping Use   Vaping status: Never Used  Substance and Sexual Activity   Alcohol use: No    Alcohol/week: 0.0 standard drinks of alcohol   Drug use: Not Currently    Types: Cocaine    Comment: 04/10/2016 "last used cocaine back in November 2017"   Sexual activity: Not Currently    Birth control/protection: Surgical  Other Topics Concern   Not on file  Social History Narrative   Has 1 son, [name]   Lives with son and his boyfriend   Her house has ramps and handrails should she ever needs them.    Her mother lives down the street from her and is a good support person in addition to her son.   She drives herself, has private transportation.    Cocaine free since 02/24/16, smoke free since 04/10/16   Social Determinants of Health   Financial Resource Strain: Low Risk  (07/11/2022)   Overall Financial Resource Strain (CARDIA)    Difficulty of Paying Living Expenses: Not very hard  Food Insecurity: Food Insecurity Present (11/10/2022)   Hunger Vital Sign    Worried About Running Out of Food in the Last Year: Sometimes true    Ran Out of Food in the Last Year: Sometimes true  Transportation Needs: No Transportation Needs (11/10/2022)   PRAPARE - Administrator, Civil Service (Medical): No     Lack of Transportation (Non-Medical): No  Physical Activity: Inactive (07/11/2022)   Exercise Vital Sign    Days of Exercise per Week: 0 days    Minutes of Exercise per Session: 0 min  Stress: Stress Concern Present (07/11/2022)   [employer] of Occupational Health - Occupational Stress Questionnaire    Feeling of Stress : To some extent  Social Connections: Socially Isolated (07/11/2022)   Social Connection and Isolation Panel [NHANES]    Frequency of Communication with Friends and Family: Three times a week    Frequency of Social Gatherings with Friends and Family: Three times a week    Attends Religious Services: Never    Active Member of Clubs or Organizations: No    Attends Banker Meetings: Never    Marital Status: Widowed   Additional Social History:                         Sleep: Good  Appetite:  Good  Current Medications: Current Facility-Administered Medications  Medication Dose Route Frequency Provider Last Rate Last Admin   acetaminophen (TYLENOL) tablet 650 mg  650 mg Oral Q6H PRN Bing Neighbors, NP   650 mg at 11/20/22 2121   albuterol (VENTOLIN HFA) 108 (90 Base) MCG/ACT inhaler 2 puff  2 puff Inhalation Q6H PRN Bing Neighbors, NP   2 puff at 11/20/22 1838   alteplase (CATHFLO ACTIVASE) injection 2 mg  2 mg Intracatheter Once PRN Wendee Beavers, NP       amLODipine (NORVASC) tablet 5 mg  5 mg Oral QPM Mosetta Pigeon, MD   5 mg at 11/20/22 1834   atorvastatin (LIPITOR) tablet 20 mg  20 mg Oral Daily Bing Neighbors, NP   20 mg at 11/21/22 4098   calcitRIOL (ROCALTROL) capsule 0.5 mcg  0.5 mcg Oral Daily Bing Neighbors, NP   0.5 mcg  at 11/21/22 0924   carvedilol (COREG) tablet 12.5 mg  12.5 mg Oral BID WC Bing Neighbors, NP   12.5 mg at 11/21/22 1610   Chlorhexidine Gluconate Cloth 2 % PADS 6 each  6 each Topical Q0600 Wendee Beavers, NP       cyclobenzaprine (FLEXERIL) tablet 10 mg  10 mg Oral TID Sarina Ill, DO   10 mg at 11/21/22 9604   dicyclomine (BENTYL) capsule 10 mg  10 mg Oral TID Deneen Harts, NP   10 mg at 11/21/22 1152   DULoxetine (CYMBALTA) DR capsule 30 mg  30 mg Oral Daily Bing Neighbors, NP   30 mg at 11/21/22 5409   epoetin alfa (EPOGEN) injection 10,000 Units  10,000 Units Intravenous Q M,W,F-HD Wendee Beavers, NP   10,000 Units at 11/20/22 1658   escitalopram (LEXAPRO) tablet 20 mg  20 mg Oral Daily Bing Neighbors, NP   20 mg at 11/21/22 8119   ferrous sulfate tablet 325 mg  325 mg Oral Maryland Pink, NP   325 mg at 11/21/22 0925   fluticasone furoate-vilanterol (BREO ELLIPTA) 100-25 MCG/ACT 1 puff  1 puff Inhalation Daily Bing Neighbors, NP   1 puff at 11/21/22 1478   And   umeclidinium bromide (INCRUSE ELLIPTA) 62.5 MCG/ACT 1 puff  1 puff Inhalation Daily Bing Neighbors, NP   1 puff at 11/21/22 2956   folic acid (FOLVITE) tablet 1 mg  1 mg Oral Daily Bing Neighbors, NP   1 mg at 11/21/22 2130   haloperidol (HALDOL) tablet 5 mg  5 mg Oral TID PRN Bing Neighbors, NP   5 mg at 11/16/22 2216   Or   haloperidol lactate (HALDOL) injection 5 mg  5 mg Intramuscular TID PRN Bing Neighbors, NP       heparin injection 1,000 Units  1,000 Units Intracatheter PRN Wendee Beavers, NP   1,000 Units at 11/17/22 8657   hydrOXYzine (ATARAX) tablet 25 mg  25 mg Oral TID PRN Bing Neighbors, NP   25 mg at 11/17/22 1624   influenza vac split trivalent PF (FLULAVAL) injection 0.5 mL  0.5 mL Intramuscular Tomorrow-1000 Sarina Ill, DO       loperamide (IMODIUM) capsule 2 mg  2 mg Oral PRN Lewanda Rife, MD   2 mg at 11/18/22 1038   loperamide (IMODIUM) capsule 2 mg  2 mg Oral TID Lewanda Rife, MD   2 mg at 11/21/22 8469   melatonin tablet 5 mg  5 mg Oral QHS Lewanda Rife, MD   5 mg at 11/20/22 2121   mirtazapine (REMERON) tablet 7.5 mg  7.5 mg Oral QHS Lewanda Rife, MD   7.5 mg at 11/20/22 2122   pantoprazole  (PROTONIX) EC tablet 80 mg  80 mg Oral Daily Bing Neighbors, NP   80 mg at 11/21/22 6295   QUEtiapine (SEROQUEL) tablet 100 mg  100 mg Oral Daily Lewanda Rife, MD   100 mg at 11/21/22 0924   QUEtiapine (SEROQUEL) tablet 200 mg  200 mg Oral QHS Lewanda Rife, MD   200 mg at 11/20/22 2121   torsemide (DEMADEX) tablet 20 mg  20 mg Oral BID Bing Neighbors, NP   20 mg at 11/21/22 2841    Lab Results:  Results for orders placed or performed during the hospital encounter of 11/10/22 (from the past 48 hour(s))  CBC     Status: Abnormal  Collection Time: 11/20/22  2:12 PM  Result Value Ref Range   WBC 5.8 4.0 - 10.5 K/uL   RBC 2.76 (L) 3.87 - 5.11 MIL/uL   Hemoglobin 8.0 (L) 12.0 - 15.0 g/dL   HCT 16.1 (L) 09.6 - 04.5 %   MCV 95.3 80.0 - 100.0 fL   MCH 29.0 26.0 - 34.0 pg   MCHC 30.4 30.0 - 36.0 g/dL   RDW 40.9 (H) 81.1 - 91.4 %   Platelets 324 150 - 400 K/uL   nRBC 0.0 0.0 - 0.2 %    Comment: Performed at [facility], [address], [place], [place] [address]  Renal function panel     Status: Abnormal   Collection Time: 11/20/22  2:13 PM  Result Value Ref Range   Sodium 133 (L) 135 - 145 mmol/L   Potassium 4.6 3.5 - 5.1 mmol/L   Chloride 98 98 - 111 mmol/L   CO2 27 22 - 32 mmol/L   Glucose, Bld 75 70 - 99 mg/dL    Comment: Glucose reference range applies only to samples taken after fasting for at least 8 hours.   BUN 51 (H) 8 - 23 mg/dL   Creatinine, Ser 6.21 (H) 0.44 - 1.00 mg/dL   Calcium 8.8 (L) 8.9 - 10.3 mg/dL   Phosphorus 4.7 (H) 2.5 - 4.6 mg/dL   Albumin 3.6 3.5 - 5.0 g/dL   GFR, Estimated 12 (L) >60 mL/min    Comment: (NOTE) Calculated using the CKD-EPI Creatinine Equation (2021)    Anion gap 8 5 - 15    Comment: Performed at [facility], [address], [place], [place] [address]   *Note: Due to a large number of results and/or encounters for the requested time period, some results have not been displayed. A complete set of results  can be found in Results Review.    Blood Alcohol level:  Lab Results  Component Value Date   ETH <10 11/10/2022   ETH <10 06/21/2022    Metabolic Disorder Labs: Lab Results  Component Value Date   HGBA1C 5.0 01/05/2022   MPG 99.67 06/17/2021   MPG 105.41 07/23/2020   No results found for: "PROLACTIN" Lab Results  Component Value Date   CHOL 151 11/14/2022   TRIG 100 11/14/2022   HDL 65 11/14/2022   CHOLHDL 2.3 11/14/2022   VLDL 20 11/14/2022   LDLCALC 66 11/14/2022   LDLCALC 54 08/05/2018    Physical Findings: AIMS:  , ,  ,  ,    CIWA:    COWS:     Musculoskeletal: Strength & Muscle Tone: within normal limits Gait & Station: normal Patient leans: N/A  Psychiatric Specialty Exam:  Presentation  General Appearance:  Appropriate for Environment  Eye Contact: Fair  Speech: Clear and Coherent  Speech Volume: Normal  Handedness:No data recorded  Mood and Affect  Mood: Anxious; Depressed  Affect: Congruent   Thought Process  Thought Processes: Coherent; Goal Directed  Descriptions of Associations:Intact  Orientation:Full (Time, Place and Person)  Thought Content:Abstract Reasoning; Rumination  History of Schizophrenia/Schizoaffective disorder:Yes  Duration of Psychotic Symptoms:Greater than six months  Hallucinations:No data recorded Ideas of Reference:None  Suicidal Thoughts:No data recorded Homicidal Thoughts:No data recorded  Sensorium  Memory: Recent Fair  Judgment: Impaired  Insight: Shallow   Executive Functions  Concentration: Fair  Attention Span: Fair  Recall:No data recorded Fund of Knowledge:No data recorded Language: Fair   Psychomotor Activity  Psychomotor Activity:No data recorded  Assets  Assets:  Communication Skills; Desire for Improvement   Sleep  Sleep:No data recorded    Blood pressure (!) 160/92, pulse 80, temperature (!) 97.4 F (36.3 C), resp. rate (!) 22, height 5\' 3"  (1.6 m),  weight 88.9 kg, SpO2 100%. Body mass index is 34.72 kg/m.   Treatment Plan Summary: Daily contact with patient to assess and evaluate symptoms and progress in treatment, Medication management, and Plan continue current medications.  Cordae Mccarey Tresea Mall, DO 11/21/2022, 1:31 PM

## 2022-11-21 NOTE — Group Note (Signed)
Date:  11/21/2022 Time:  10:55 PM  Group Topic/Focus:  Self Care:   The focus of this group is to help patients understand the importance of self-care in order to improve or restore emotional, physical, spiritual, interpersonal, and financial health.    Participation Level:  Minimal  Participation Quality:  Appropriate  Affect:  Appropriate  Cognitive:  Appropriate  Insight: Improving  Engagement in Group:  Improving  Modes of Intervention:  Discussion  Additional Comments:    Maeola Harman 11/21/2022, 10:55 PM

## 2022-11-21 NOTE — BHH Counselor (Signed)
[name] gave pt list of private landlords for the elderly in [place].   [name] had conversation with pt regarding housing situation.   [name] emphasized that pt would need to return back to her home and that "not getting along with family" is not valid enough to stay in the hospital, especially on a psych unit.   [name] emphasized that she would need to search for a new apartment once she is discharged home.   From a TOC perspective, pt has safe discharge plan which is going back to her apartment until she makes plans to move to [place].   Reynaldo Minium, MSW, [place] 11/21/2022 3:12 PM

## 2022-11-21 NOTE — Group Note (Signed)
Date:  11/20/22 Time:  09:00 pm  Group Topic/Focus:  Making Healthy Choices:   The focus of this group is to help patients identify negative/unhealthy choices they were using prior to admission and identify positive/healthier coping strategies to replace them upon discharge.    Participation Level:  Active  Participation Quality:  Appropriate  Affect:  Appropriate  Cognitive:  Appropriate  Insight: Appropriate  Engagement in Group:  Engaged  Modes of Intervention:  Education  Additional Comments:    Garry Heater 11/21/2022, 4:03 AM

## 2022-11-21 NOTE — Group Note (Signed)
Date:  11/21/2022 Time:  12:05 PM  Group Topic/Focus:  Identifying Needs:   The focus of this group is to help patients identify their personal needs that have been historically problematic and identify healthy behaviors to address their needs. Personal Choices and Values:   The focus of this group is to help patients assess and explore the importance of values in their lives, how their values affect their decisions, how they express their values and what opposes their expression.    Participation Level:  Active  Participation Quality:  Appropriate  Affect:  Appropriate  Cognitive:  Alert and Oriented  Insight: Appropriate  Engagement in Group:  Engaged  Modes of Intervention:  Discussion, Socialization, and Support  Additional Comments:    Iwalani Templeton l Yahye Siebert 11/21/2022, 12:05 PM

## 2022-11-21 NOTE — Progress Notes (Signed)
Physical Therapy Treatment Patient Details Name: Tiffany [name] MRN: [id number] DOB: December 06, 1960 Today's Date: 11/21/2022   History of Present Illness Tiffany [name] is a 62 year old African-American female who is voluntarily admitted to inpatient psychiatry for depression and agitation.  She had a visit from her family and became very disturbed and called 911 and she was brought to [facility].with complex history of multiple medical problems including HTN, ESRD recently started HD MWF.  Other problems include congestive heart failure, diabetes, COPD, polysubstance abuse, stroke, and malingering.    PT Comments  Pt in day room in wheelchair.  [name] able navigate in wheelchair independently on unit and locks chair appropriately and manages leg rests.  [name] able to shimmy her pants and brief down in sitting and squat pivot to [facility], void and attend to her own needs.  Shimmy's pants back up prior to squat pivot back to wheelchair.  She does have poor clearance and sit sideways on armrests before completing transfer but does so without assist.  [name] able to stand to RW with cga/min a x 1 and walk 3' and 5' with RW with close wheelchair follow due to poor tolerance and balance.    Overall improving mobility on unit.  Pt stated she has a manual and electric wheelchair at home which she uses but is interested in walking again and being more mobile.     If plan is discharge home, recommend the following: A little help with walking and/or transfers;Direct supervision/assist for medications management;Supervision due to cognitive status;Assist for transportation;Help with stairs or ramp for entrance;A little help with bathing/dressing/bathroom   Can travel by private vehicle        Equipment Recommendations       Recommendations for Other Services       Precautions / Restrictions Precautions Precautions: Fall Restrictions Weight Bearing Restrictions: No     Mobility  Bed Mobility Overal bed  mobility: Needs Assistance               Patient Response: Cooperative  Transfers Overall transfer level: Needs assistance Equipment used: Rolling walker (2 wheels) Transfers: Sit to/from Stand, Bed to chair/wheelchair/BSC Sit to Stand: Min assist, Contact guard assist     Squat pivot transfers: Supervision, Modified independent (Device/Increase time)          Ambulation/Gait Ambulation/Gait assistance: Min assist Gait Distance (Feet): 5 Feet Assistive device: Rolling walker (2 wheels) Gait Pattern/deviations: Step-to pattern, Trunk flexed, Shuffle, Decreased stride length Gait velocity: dec     General Gait Details: cues for proper walker placement for steps   Stairs             Wheelchair Mobility     Tilt Bed Tilt Bed Patient Response: Cooperative  Modified Rankin (Stroke Patients Only)       Balance Overall balance assessment: Needs assistance Sitting-balance support: Feet supported Sitting balance-Leahy Scale: Good     Standing balance support: Bilateral upper extremity supported Standing balance-Leahy Scale: Poor Standing balance comment: no LOB howewver very poor standing tolerance                            Cognition Arousal: Alert Behavior During Therapy: WFL for tasks assessed/performed Overall Cognitive Status: History of cognitive impairments - at baseline  Exercises      General Comments        Pertinent Vitals/Pain Pain Assessment Pain Assessment: No/denies pain Pain Intervention(s): Monitored during session    Home Living                          Prior Function            PT Goals (current goals can now be found in the care plan section) Progress towards PT goals: Progressing toward goals    Frequency    Min 1X/week      PT Plan      Co-evaluation              AM-PAC PT "6 Clicks" Mobility   Outcome Measure  Help  needed turning from your back to your side while in a flat bed without using bedrails?: None Help needed moving from lying on your back to sitting on the side of a flat bed without using bedrails?: A Little Help needed moving to and from a bed to a chair (including a wheelchair)?: A Little Help needed standing up from a chair using your arms (e.g., wheelchair or bedside chair)?: A Little Help needed to walk in hospital room?: A Lot Help needed climbing 3-5 steps with a railing? : Total 6 Click Score: 16    End of Session Equipment Utilized During Treatment: Gait belt Activity Tolerance: Patient tolerated treatment well;Other (comment) Patient left: in chair Nurse Communication: Mobility status PT Visit Diagnosis: Difficulty in walking, not elsewhere classified (R26.2)     Time: 1610-9604 PT Time Calculation (min) (ACUTE ONLY): 17 min  Charges:    $Therapeutic Activity: 8-22 mins PT General Charges $$ ACUTE PT VISIT: 1 Visit                   Danielle Dess, PTA 11/21/22, 3:40 PM

## 2022-11-21 NOTE — Group Note (Signed)
LCSW Group Therapy Note  Group Date: 11/21/2022 Start Time: 1300 End Time: 1345   Type of Therapy and Topic:  Group Therapy: Positive Affirmations  Participation Level:  Did Not Attend   Description of Group:   This group addressed positive affirmation towards self and others.  Patients went around the room and identified two positive things about themselves and two positive things about a peer in the room.  Patients reflected on how it felt to share something positive with others, to identify positive things about themselves, and to hear positive things from others/ Patients were encouraged to have a daily reflection of positive characteristics or circumstances.   Therapeutic Goals: Patients will verbalize two of their positive qualities Patients will demonstrate empathy for others by stating two positive qualities about a peer in the group Patients will verbalize their feelings when voicing positive self affirmations and when voicing positive affirmations of others Patients will discuss the potential positive impact on their wellness/recovery of focusing on positive traits of self and others.  Summary of Patient Progress: X  Therapeutic Modalities:   Cognitive Behavioral Therapy Motivational Interviewing    Elza Rafter, [place] 11/21/2022  2:04 PM

## 2022-11-22 DIAGNOSIS — F250 Schizoaffective disorder, bipolar type: Secondary | ICD-10-CM | POA: Diagnosis not present

## 2022-11-22 LAB — CBC
HCT: 26.2 % — ABNORMAL LOW (ref 36.0–46.0)
Hemoglobin: 8.2 g/dL — ABNORMAL LOW (ref 12.0–15.0)
MCH: 29.2 pg (ref 26.0–34.0)
MCHC: 31.3 g/dL (ref 30.0–36.0)
MCV: 93.2 fL (ref 80.0–100.0)
Platelets: 269 10*3/uL (ref 150–400)
RBC: 2.81 MIL/uL — ABNORMAL LOW (ref 3.87–5.11)
RDW: 16.6 % — ABNORMAL HIGH (ref 11.5–15.5)
WBC: 6.7 10*3/uL (ref 4.0–10.5)
nRBC: 0 % (ref 0.0–0.2)

## 2022-11-22 LAB — RENAL FUNCTION PANEL
Albumin: 3.3 g/dL — ABNORMAL LOW (ref 3.5–5.0)
Anion gap: 10 (ref 5–15)
BUN: 41 mg/dL — ABNORMAL HIGH (ref 8–23)
CO2: 27 mmol/L (ref 22–32)
Calcium: 9 mg/dL (ref 8.9–10.3)
Chloride: 98 mmol/L (ref 98–111)
Creatinine, Ser: 3.42 mg/dL — ABNORMAL HIGH (ref 0.44–1.00)
GFR, Estimated: 14 mL/min — ABNORMAL LOW (ref >60)
Glucose, Bld: 88 mg/dL (ref 70–99)
Phosphorus: 4.8 mg/dL — ABNORMAL HIGH (ref 2.5–4.6)
Potassium: 4.5 mmol/L (ref 3.5–5.1)
Sodium: 135 mmol/L (ref 135–145)

## 2022-11-22 MED ORDER — QUETIAPINE FUMARATE 200 MG PO TABS: 200.0000 mg | ORAL_TABLET | Freq: Every day | ORAL | 3 refills | Status: DC

## 2022-11-22 MED ORDER — EPOETIN ALFA 10000 UNIT/ML IJ SOLN
INTRAMUSCULAR | Status: AC
  Filled 2022-11-22: qty 1

## 2022-11-22 MED ORDER — MELATONIN 5 MG PO TABS: 5.0000 mg | ORAL_TABLET | Freq: Every day | ORAL | 0 refills | Status: DC

## 2022-11-22 MED ORDER — ALTEPLASE 2 MG IJ SOLR: 2.0000 mg | Freq: Once | INTRAMUSCULAR | 3 refills | Status: DC | PRN

## 2022-11-22 MED ORDER — EPOETIN ALFA 10000 UNIT/ML IJ SOLN: 10000.0000 [IU] | INTRAMUSCULAR | 3 refills | Status: AC

## 2022-11-22 MED ORDER — QUETIAPINE FUMARATE 100 MG PO TABS: 100.0000 mg | ORAL_TABLET | Freq: Every day | ORAL | 3 refills | Status: DC

## 2022-11-22 MED ORDER — DULOXETINE HCL 30 MG PO CPEP: 30.0000 mg | ORAL_CAPSULE | Freq: Every day | ORAL | 3 refills | Status: DC

## 2022-11-22 MED ORDER — LORAZEPAM 2 MG/ML PO CONC
0.5000 mg | Freq: Once | ORAL | Status: DC
  Filled 2022-11-22: qty 0.25

## 2022-11-22 MED ORDER — MIRTAZAPINE 7.5 MG PO TABS: 7.5000 mg | ORAL_TABLET | Freq: Every day | ORAL | 3 refills | Status: DC

## 2022-11-22 MED ORDER — CARVEDILOL 12.5 MG PO TABS: 12.5000 mg | ORAL_TABLET | Freq: Two times a day (BID) | ORAL | 3 refills | Status: DC

## 2022-11-22 MED ORDER — AMLODIPINE BESYLATE 5 MG PO TABS: 5.0000 mg | ORAL_TABLET | Freq: Every evening | ORAL | 3 refills | Status: DC

## 2022-11-22 NOTE — Progress Notes (Signed)
Received patient in bed to unit.    Informed consent signed and in chart.    TX duration: 3.5 hrs     Transported back to floor  Hand-off given to patient's nurse.   Access used:  rt chest cvc Access issues: n/a  Total UF removed: 1500 mls Medication(s) given: epogen 10,000 units       Maple Hudson, RN Dialysis Unit

## 2022-11-22 NOTE — BHH Suicide Risk Assessment (Signed)
[facility] Discharge Suicide Risk Assessment   Principal Problem: Schizoaffective disorder [facility]) Discharge Diagnoses: Principal Problem:   Schizoaffective disorder (HCC)   Total Time spent with patient: 1 hour  Musculoskeletal: Strength & Muscle Tone: within normal limits Gait & Station: normal Patient leans: N/A  Psychiatric Specialty Exam  Presentation  General Appearance:  Appropriate for Environment  Eye Contact: Fair  Speech: Clear and Coherent  Speech Volume: Normal  Handedness:No data recorded  Mood and Affect  Mood: Anxious; Depressed  Duration of Depression Symptoms: Greater than two weeks  Affect: Congruent   Thought Process  Thought Processes: Coherent; Goal Directed  Descriptions of Associations:Intact  Orientation:Full (Time, Place and Person)  Thought Content:Abstract Reasoning; Rumination  History of Schizophrenia/Schizoaffective disorder:Yes  Duration of Psychotic Symptoms:Greater than six months  Hallucinations:No data recorded Ideas of Reference:None  Suicidal Thoughts:No data recorded Homicidal Thoughts:No data recorded  Sensorium  Memory: Recent Fair  Judgment: Impaired  Insight: Shallow   Executive Functions  Concentration: Fair  Attention Span: Fair  Recall:No data recorded Fund of Knowledge:No data recorded Language: Fair   Psychomotor Activity  Psychomotor Activity:No data recorded  Assets  Assets: Communication Skills; Desire for Improvement   Sleep  Sleep:No data recorded   Blood pressure (!) 152/92, pulse 77, temperature 97.8 F (36.6 C), resp. rate 16, height 5\' 3"  (1.6 m), weight 88.3 kg, SpO2 100%. Body mass index is 34.48 kg/m.  Mental Status Per Nursing Assessment::   On Admission:  Thoughts of violence towards others  Demographic Factors:  NA  Loss Factors: NA  Historical Factors: NA  Risk Reduction Factors:   NA   Cognitive Features That Contribute To Risk:   Closed-mindedness    Suicide Risk:  Minimal: No identifiable suicidal ideation.  Patients presenting with no risk factors but with morbid ruminations; may be classified as minimal risk based on the severity of the depressive symptoms    Plan Of Care/Follow-up recommendations: See SW Note   Sarina Ill, DO 11/22/2022, 9:55 AM

## 2022-11-22 NOTE — Group Note (Signed)
Date:  11/22/2022 Time:  10:51 AM  Group Topic/Focus:  Movement Therapy    Participation Level:  Did Not Attend   Rodena Goldmann 11/22/2022, 10:51 AM

## 2022-11-22 NOTE — Progress Notes (Signed)
  [facility] Adult Case Management Discharge Plan :  Will you be returning to the same living situation after discharge:  Yes,  pt will return to her apartment  At discharge, do you have transportation home?: Yes,  [name] will assist with transportation  Do you have the ability to pay for your medications: Yes,  AET[employer] MEDICARE / AETNA MEDICARE HMO/PPO  Release of information consent forms completed and in the chart;  Patient's signature needed at discharge.  Patient to Follow up at:  Follow-up Information     [facility]. Go to.   Specialty: Urgent Care Why: [facility] is open 24/7 for urgent needs. Contact information: [address] [place] [address] [phone]                Next level of care provider has access to [facility] Link:no  Safety Planning and Suicide Prevention discussed: Yes,  although pt declined, SPE brochure given to pt      Has patient been referred to the Quitline?: Patient refused referral for treatment  Patient has been referred for addiction treatment: No known substance use disorder.  [address] LCSWA 11/22/2022, 11:28 AM

## 2022-11-22 NOTE — Progress Notes (Signed)
After speaking with doctor, patient agreeable for discharge. She asks for [employer] number and makes phone calls.  She was educated on prescriptions, follow up care, when to call for help/suicide hotline. Pt questions were answered and pt verbalized understanding. Patient refused to fill out suicide safety plan and survey. Pt's belongings were returned. Pt was safely discharged via ambulance.

## 2022-11-22 NOTE — Progress Notes (Signed)
[facility]  ROUNDING NOTE   Subjective:   Patient seen and evaluated during dialysis   HEMODIALYSIS FLOWSHEET:  Blood Flow Rate (mL/min): 350 mL/min Arterial Pressure (mmHg): -150 mmHg Venous Pressure (mmHg): 140 mmHg TMP (mmHg): 15 mmHg Ultrafiltration Rate (mL/min): 921 mL/min Dialysate Flow Rate (mL/min): 300 ml/min Dialysis Fluid Bolus: Normal Saline Bolus Amount (mL): 100 mL\  Complained of chest pressure at dialysis initiation, symptoms relieved with nonpharmacologic interventions, speaking quietly, breathing exercises Patient states she feels anxious today. Symptoms resolving treatment continued.  Objective:  Vital signs in last 24 hours:  Temp:  [97.8 F (36.6 C)-98.2 F (36.8 C)] 97.8 F (36.6 C) (11/06 0816) Pulse Rate:  [73-103] 103 (11/06 1030) Resp:  [14-27] 18 (11/06 1030) BP: (117-181)/(58-101) 165/89 (11/06 1030) SpO2:  [93 %-100 %] 100 % (11/06 0900) Weight:  [88.3 kg] 88.3 kg (11/06 0816)  Weight change:  Filed Weights   11/15/22 1244 11/20/22 1340 11/22/22 0816  Weight: 84.3 kg 88.9 kg 88.3 kg    Intake/Output: No intake/output data recorded.   Intake/Output this shift:  No intake/output data recorded.  Physical Exam: General: NAD, sitting in chair  Head: Normocephalic, atraumatic. Moist oral mucosal membranes  Eyes: Anicteric  Lungs:  Clear to auscultation, room air  Heart: Regular rate and rhythm  Abdomen:  Soft, nontender  Extremities:  1-2+ peripheral edema.  Neurologic: Alert moving all four extremities  Skin: No lesions  Access: Rt chest Permcath    Basic Metabolic Panel: Recent Labs  Lab 11/17/22 0744 11/20/22 1413 11/22/22 0824  NA 135 133* 135  K 3.8 4.6 4.5  CL 98 98 98  CO2 28 27 27   GLUCOSE 96 75 88  BUN 32* 51* 41*  CREATININE 3.25* 4.05* 3.42*  CALCIUM 9.5 8.8* 9.0  PHOS 3.7 4.7* 4.8*    Liver Function Tests: Recent Labs  Lab 11/17/22 0744 11/20/22 1413 11/22/22 0824  ALBUMIN 3.2* 3.6  3.3*   No results for input(s): "LIPASE", "AMYLASE" in the last 168 hours. No results for input(s): "AMMONIA" in the last 168 hours.  CBC: Recent Labs  Lab 11/17/22 0744 11/20/22 1412 11/22/22 0824  WBC 7.1 5.8 6.7  HGB 8.0* 8.0* 8.2*  HCT 25.6* 26.3* 26.2*  MCV 92.4 95.3 93.2  PLT 319 324 269    Cardiac Enzymes: No results for input(s): "CKTOTAL", "CKMB", "CKMBINDEX", "TROPONINI" in the last 168 hours.  BNP: Invalid input(s): "POCBNP"  CBG: No results for input(s): "GLUCAP" in the last 168 hours.   Microbiology: Results for orders placed or performed during the hospital encounter of 11/07/22  SARS Coronavirus 2 by RT PCR (hospital order, performed in [facility] hospital lab) *cepheid single result test* Anterior Nasal Swab     Status: None   Collection Time: 11/07/22  1:51 PM   Specimen: Anterior Nasal Swab  Result Value Ref Range Status   SARS Coronavirus 2 by RT PCR NEGATIVE NEGATIVE Final    Comment: Performed at [facility], [address] [place], [place] [address]   *Note: Due to a large number of results and/or encounters for the requested time period, some results have not been displayed. A complete set of results can be found in Results Review.    Coagulation Studies: No results for input(s): "LABPROT", "INR" in the last 72 hours.  Urinalysis: No results for input(s): "COLORURINE", "LABSPEC", "PHURINE", "GLUCOSEU", "HGBUR", "BILIRUBINUR", "KETONESUR", "PROTEINUR", "UROBILINOGEN", "NITRITE", "LEUKOCYTESUR" in the last 72 hours.  Invalid input(s): "APPERANCEUR"    Imaging: No  results found.   Medications:     amLODipine  5 mg Oral QPM   atorvastatin  20 mg Oral Daily   calcitRIOL  0.5 mcg Oral Daily   carvedilol  12.5 mg Oral BID WC   Chlorhexidine Gluconate Cloth  6 each Topical Q0600   cyclobenzaprine  10 mg Oral TID   dicyclomine  10 mg Oral TID AC   DULoxetine  30 mg Oral Daily   epoetin (EPOGEN/PROCRIT) injection  10,000 Units  Intravenous Q M,W,F-HD   escitalopram  20 mg Oral Daily   ferrous sulfate  325 mg Oral QODAY   fluticasone furoate-vilanterol  1 puff Inhalation Daily   And   umeclidinium bromide  1 puff Inhalation Daily   folic acid  1 mg Oral Daily   influenza vac split trivalent PF  0.5 mL Intramuscular Tomorrow-1000   loperamide  2 mg Oral TID   LORazepam  0.5 mg Oral Once   melatonin  5 mg Oral QHS   mirtazapine  7.5 mg Oral QHS   pantoprazole  80 mg Oral Daily   QUEtiapine  100 mg Oral Daily   QUEtiapine  200 mg Oral QHS   torsemide  20 mg Oral BID   acetaminophen, albuterol, alteplase, haloperidol **OR** haloperidol lactate, heparin, hydrOXYzine, loperamide  Assessment/ Plan:  [name] is a 62 y.o.  female  with an extensive past medical history including schizophrenia, hypertension, CHF, diabetes, stroke, and end-stage renal disease on hemodialysis. [name] currently admitted for Schizoaffective disorder [facility]) [F25.9]   End-stage renal disease on hemodialysis.  Dialysis received today, UF goal 1.5 L as tolerated.  Chest pain reported at initiation of treatment, resolved with nonpharmacologic measures.  Will resume treatment and monitor patient.  Next treatment scheduled for Friday.  2. Anemia of chronic kidney disease Lab Results  Component Value Date   HGB 8.2 (L) 11/22/2022    Hgb not at goal. Will continue EPO 10000 units with dialysis treatments.   3. Secondary Hyperparathyroidism: with outpatient labs: none available Lab Results  Component Value Date   PTH 57 02/18/2020   CALCIUM 9.0 11/22/2022   CAION 1.16 06/02/2022   PHOS 4.8 (H) 11/22/2022    Will continue to monitor bone minerals during this admission.  Continue calcitriol  4.  Hypertension with chronic kidney disease.  Home regimen includes amlodipine, carvedilol, and torsemide.  Currently receiving these medications.    Blood pressure stable at this time.   LOS: 12 Zakkary Thibault 11/6/202410:46 AM

## 2022-11-22 NOTE — Progress Notes (Signed)
After initiation of tx pt. C/o chest pains. Christophe Louis, NP present. Uf turned off and bfr decreased to 350. Ativan ordered. Will monitor pt. Pt. Eyes closed resting at this time;

## 2022-11-22 NOTE — Care Management Important Message (Signed)
Important Message  Patient Details  Name: Tiffany [name] MRN: [id number] Date of Birth: 1960/04/22   Important Message Given:  Yes - Medicare IM     Elza Rafter, LCSWA 11/22/2022, 11:31 AM

## 2022-11-22 NOTE — Progress Notes (Signed)
Patient stated that she had a good sleep. Patient denies SI,HI and AVH.Patient had ADLs with minimal assistance and able to transfer to wheelchair. Patient is taken for dialysis.

## 2022-11-22 NOTE — Group Note (Signed)
Recreation Therapy Group Note   Group Topic:Goal Setting  Group Date: 11/22/2022 Start Time: 1400 End Time: 1450 Facilitators: Rosina Lowenstein, LRT, CTRS Location:  Craft Room  Group Description: Product/process development scientist. Patients were given many different magazines, a glue stick, markers, and a piece of cardstock paper. LRT and pts discussed the importance of having goals in life. LRT and pts discussed the difference between short-term and long-term goals, as well as what a SMART goal is. LRT encouraged pts to create a vision board, with images they picked and then cut out with safety scissors from the magazine, for themselves, that capture their short and long-term goals. LRT encouraged pts to show and explain their vision board to the group.   Goal Area(s) Addressed:  Patient will gain knowledge of short vs. long term goals.  Patient will identify goals for themselves. Patient will practice setting SMART goals. Patient will verbalize their goals to LRT and peers.   Affect/Mood: N/A   Participation Level: Did not attend    Clinical Observations/Individualized Feedback: Azari did not attend group.   Plan: Continue to engage patient in RT group sessions 2-3x/week.   Rosina Lowenstein, LRT, CTRS 11/22/2022 3:09 PM

## 2022-11-22 NOTE — Progress Notes (Signed)
Patient returned to the unit from dialysis at 12:40pm. She received lunch tray and took scheduled medications. Patient also requested PRN medication for anxiety. Hydrozyxine given. Patient is upset when told that she is discharging today and begins crying.

## 2022-11-22 NOTE — Discharge Summary (Signed)
Physician Discharge Summary Note  Patient:  Tiffany [name] is an 62 y.o., female MRN:  [id number] DOB:  1960-12-12 Patient phone:  [phone] (home)  Patient address:   [address] [place] [address],  Total Time spent with patient: 1 hour  Date of Admission:  11/10/2022 Date of Discharge: 11/22/2022  Reason for Admission:  Tiffany [name] is a 62 year old African-American female who is voluntarily admitted to inpatient psychiatry for depression and agitation.  She had a visit from her family and became very disturbed and called 911 and she was brought to [facility].with complex history of multiple medical problems including HTN, ESRD recently started HD MWF.  Other problems include congestive heart failure, diabetes, COPD, polysubstance abuse, stroke, and malingering.  She called mobile crisis tonight due to feeling paranoid that everyone was looking at her like something was wrong with her and getting angry with her family who was visiting her. She has been depressed as well. Apparently mobile crisis center called EMS and [facility] brought her here for evaluation. She denies SI.  She has a long history of behavior problems and tells me that she has never been psychiatrically admitted but she has been seen in the emergency room multiple times and been diagnosed with depression, schizoaffective disorder, etc.  She does not have a current outpatient provider for her psychiatric medications.  She denies any auditory or visual hallucinations.  She denies any suicidal or homicidal ideation.   Principal Problem: Schizoaffective disorder [facility]) Discharge Diagnoses: Principal Problem:   Schizoaffective disorder [facility])   Past Psychiatric History: Extensive cluster B traits  Past Medical History:  Past Medical History:  Diagnosis Date   Abdominal pain    Acute encephalopathy 11/21/2017   Acute exacerbation of CHF (congestive heart failure) (HCC) 03/03/2022   Acute GI bleeding 03/29/2021   Acute  kidney injury superimposed on chronic kidney disease (HCC) 02/18/2020   Acute metabolic encephalopathy 02/20/2020   Acute on chronic diastolic heart failure (HCC) 02/04/2022   Acute on chronic renal failure (HCC) 06/02/2022   Aggressive behavior    Agitation 11/22/2017   Anoxic brain injury (HCC) 09/08/2016   C. Arrest due to respiratory failure and COPD exacerbation   Anxiety    Arthritis    "all over" (04/10/2016)   Asthma 10/18/2010   Binge eating disorder    Blood loss anemia 04/24/2011   CAP (community acquired pneumonia) 06/22/2015   Cardiac arrest (HCC) 09/08/2016   PEA   Carotid artery stenosis    1-39% bilateral by dopplers 11/2016   Chronic diastolic (congestive) heart failure (HCC)    Chronic kidney disease, stage 3 (HCC)    Chronic kidney disease, stage 3b (HCC) 06/06/2021   Chronic pain syndrome 06/18/2012   Chronic post-traumatic stress disorder (PTSD) 05/27/2018   Chronic respiratory failure with hypoxia and hypercapnia (HCC) 06/22/2015   TRILOGY Vent >AVAPA-ES., Vt target 200-400, Max P 30 , PS max 20 , PS min 6-10 , E Max 6, E Min 4, Rate Auto AVAPS Rate 2 (titrate for pt comfort) , bleed O2 at 5l/m continuous flow .    CKD (chronic kidney disease) 02/03/2022   Closed displaced fracture of fifth metacarpal bone 03/21/2018   Cocaine use disorder, severe, in sustained remission (HCC) 12/17/2015   Complication of anesthesia    decreased bp, decreased heart rate   COPD (chronic obstructive pulmonary disease) (HCC) 07/08/2014   Delusional disorder, persecutory type (HCC) 06/26/2021   Depression    Diabetic neuropathy (HCC) 04/24/2011  Difficulty with speech 01/24/2018   Disorder of nervous system    Drug abuse (HCC) 11/21/2017   Dyslipidemia 04/24/2011   E. coli UTI 02/20/2020   Elevated troponin 04/28/2012   Emphysema    Encephalopathy 11/21/2017   Essential hypertension 03/22/2016   Facet arthropathy, lumbar 06/12/2022   Fibula fracture 07/10/2016    Frequent falls 10/11/2017   GERD (gastroesophageal reflux disease)    GI bleed 03/30/2021   Gout 04/11/2017   Heart attack (HCC) 1980s   Hematochezia    History of blood transfusion 1994   "couldn't stop bleeding from my period"   History of drug abuse in remission (HCC) 11/28/2015   Quit in 2017   Hyperlipidemia 03/31/2021   Hyperlipidemia LDL goal <70    Hypocalcemia    Hypokalemia    Hypomagnesemia    Incontinence    Manic depression (HCC)    Morbid obesity (HCC) 10/18/2010   Obstructive sleep apnea 10/18/2010   On home oxygen therapy    "6L; 24/7" (04/10/2016)   OSA on CPAP    "wear mask sometimes" (04/10/2016)   Painless rectal bleeding 12/31/2015   Paranoid (HCC)    "sometimes; I'm on RX for it" (04/10/2016)   Periumbilical abdominal pain    Prolonged Q-T interval on ECG    Rectal bleeding 12/31/2015   Recurrent syncope 07/09/2021   Rhabdomyolysis 06/16/2021   Schizoaffective disorder, bipolar type (HCC) 04/05/2018   Seasonal allergies    Seborrheic keratoses 12/31/2013   Seizures (HCC)    "don't know what kind; last one was ~ 1 yr ago" (04/10/2016)   Sinus bradycardia    Skin ulcer of sacrum, limited to breakdown of skin (HCC) 03/08/2022   Spondylolisthesis at L3-L4 level 06/12/2022   Stroke (HCC) 1980s   denies residual on 04/10/2016   Thrush 09/19/2013   Type 2 diabetes mellitus (HCC) 10/18/2010    Past Surgical History:  Procedure Laterality Date   CESAREAN SECTION  1997   COLONOSCOPY WITH PROPOFOL N/A 04/01/2021   Procedure: COLONOSCOPY WITH PROPOFOL;  Surgeon: Jeani Hawking, MD;  Location: [facility] ENDOSCOPY;  Service: Gastroenterology;  Laterality: N/A;  Rectal bleeding with drop in hemoglobin to 7.2 g/dL   HERNIA REPAIR     IR CHOLANGIOGRAM EXISTING TUBE  07/20/2016   IR PERC CHOLECYSTOSTOMY  05/10/2016   IR RADIOLOGIST EVAL & MGMT  06/08/2016   IR RADIOLOGIST EVAL & MGMT  06/29/2016   IR SINUS/FIST TUBE CHK-NON GI  07/12/2016   RIGHT/LEFT HEART CATH AND CORONARY  ANGIOGRAPHY N/A 06/19/2017   Procedure: RIGHT/LEFT HEART CATH AND CORONARY ANGIOGRAPHY;  Surgeon: Dolores Patty, MD;  Location: [facility] INVASIVE CV LAB;  Service: Cardiovascular;  Laterality: N/A;   TIBIA IM NAIL INSERTION Right 07/12/2016   Procedure: INTRAMEDULLARY (IM) NAIL RIGHT TIBIA;  Surgeon: Tarry Kos, MD;  Location: [facility] OR;  Service: Orthopedics;  Laterality: Right;   UMBILICAL HERNIA REPAIR  ~ 1963   "that's why I don't have a belly button"   VAGINAL HYSTERECTOMY     Family History:  Family History  Problem Relation Age of Onset   Cancer Mother        lung   Depression Mother    Cancer Father        prostate   Depression Sister    Anxiety disorder Sister    Schizophrenia Sister    Bipolar disorder Sister    Depression Sister    Depression Brother    Heart failure Other  cousin   Family Psychiatric  History: Unremarkable Social History:  Social History   Substance and Sexual Activity  Alcohol Use No   Alcohol/week: 0.0 standard drinks of alcohol     Social History   Substance and Sexual Activity  Drug Use Not Currently   Types: Cocaine   Comment: 04/10/2016 "last used cocaine back in November 2017"    Social History   Socioeconomic History   Marital status: Widowed    Spouse name: Not on file   Number of children: 3   Years of education: 12th   Highest education level: 12th grade  Occupational History   Occupation: disabled    Comment: factory production  Tobacco Use   Smoking status: Former    Current packs/day: 0.00    Average packs/day: 1.5 packs/day for 39.1 years (58.6 ttl pk-yrs)    Types: Cigarettes    Start date: 03/13/1977    Quit date: 04/10/2016    Years since quitting: 6.6    Passive exposure: Past   Smokeless tobacco: Never  Vaping Use   Vaping status: Never Used  Substance and Sexual Activity   Alcohol use: No    Alcohol/week: 0.0 standard drinks of alcohol   Drug use: Not Currently    Types: Cocaine    Comment: 04/10/2016  "last used cocaine back in November 2017"   Sexual activity: Not Currently    Birth control/protection: Surgical  Other Topics Concern   Not on file  Social History Narrative   Has 1 son, [name]   Lives with son and his boyfriend   Her house has ramps and handrails should she ever needs them.    Her mother lives down the street from her and is a good support person in addition to her son.   She drives herself, has private transportation.    Cocaine free since 02/24/16, smoke free since 04/10/16   Social Determinants of Health   Financial Resource Strain: Low Risk  (07/11/2022)   Overall Financial Resource Strain (CARDIA)    Difficulty of Paying Living Expenses: Not very hard  Food Insecurity: Food Insecurity Present (11/10/2022)   Hunger Vital Sign    Worried About Running Out of Food in the Last Year: Sometimes true    Ran Out of Food in the Last Year: Sometimes true  Transportation Needs: No Transportation Needs (11/10/2022)   PRAPARE - Administrator, Civil Service (Medical): No    Lack of Transportation (Non-Medical): No  Physical Activity: Inactive (07/11/2022)   Exercise Vital Sign    Days of Exercise per Week: 0 days    Minutes of Exercise per Session: 0 min  Stress: Stress Concern Present (07/11/2022)   [employer] of Occupational Health - Occupational Stress Questionnaire    Feeling of Stress : To some extent  Social Connections: Socially Isolated (07/11/2022)   Social Connection and Isolation Panel [NHANES]    Frequency of Communication with Friends and Family: Three times a week    Frequency of Social Gatherings with Friends and Family: Three times a week    Attends Religious Services: Never    Active Member of Clubs or Organizations: No    Attends Banker Meetings: Never    Marital Status: Widowed    Hospital Course: Anye is a 62 year old African-American female who was voluntarily admitted to inpatient psychiatry for behavioral problems  and issues with her family.  She has been to the emergency room numerous times and this time sent to [facility].  She was taken off her Zyprexa and Saphris and started on Seroquel.  Remeron was added, and her Cymbalta and Lexapro were restarted.  She participated in dialysis.  It was felt that she maximized hospitalization she was discharged home.  On the day of discharge she denied suicidal ideation, homicidal ideation, auditory or visual hallucinations.  Her judgment and insight are fair.  Physical Findings: AIMS:  , ,  ,  ,    CIWA:    COWS:     Musculoskeletal: Strength & Muscle Tone: within normal limits Gait & Station: normal Patient leans: N/A   Psychiatric Specialty Exam:  Presentation  General Appearance:  Appropriate for Environment  Eye Contact: Fair  Speech: Clear and Coherent  Speech Volume: Normal  Handedness:No data recorded  Mood and Affect  Mood: Anxious; Depressed  Affect: Congruent   Thought Process  Thought Processes: Coherent; Goal Directed  Descriptions of Associations:Intact  Orientation:Full (Time, Place and Person)  Thought Content:Abstract Reasoning; Rumination  History of Schizophrenia/Schizoaffective disorder:Yes  Duration of Psychotic Symptoms:Greater than six months  Hallucinations:No data recorded Ideas of Reference:None  Suicidal Thoughts:No data recorded Homicidal Thoughts:No data recorded  Sensorium  Memory: Recent Fair  Judgment: Impaired  Insight: Shallow   Executive Functions  Concentration: Fair  Attention Span: Fair  Recall:No data recorded Fund of Knowledge:No data recorded Language: Fair   Psychomotor Activity  Psychomotor Activity:No data recorded  Assets  Assets: Communication Skills; Desire for Improvement   Sleep  Sleep:No data recorded   Physical Exam: Physical Exam Vitals and nursing note reviewed.  Constitutional:      Appearance: Normal appearance. [name] normal weight.   Neurological:     General: No focal deficit present.     Mental Status: [name] alert and oriented to person, place, and time.  Psychiatric:        Attention and Perception: Attention and perception normal.        Mood and Affect: Mood and affect normal.        Speech: Speech normal.        Behavior: Behavior normal. Behavior is cooperative.        Thought Content: Thought content normal.        Cognition and Memory: Cognition and memory normal.        Judgment: Judgment normal.    Review of Systems  Constitutional: Negative.   HENT: Negative.    Eyes: Negative.   Respiratory: Negative.    Cardiovascular: Negative.   Gastrointestinal: Negative.   Genitourinary: Negative.   Musculoskeletal: Negative.   Skin: Negative.   Neurological: Negative.   Endo/Heme/Allergies: Negative.   Psychiatric/Behavioral: Negative.     Blood pressure (!) 181/101, pulse 82, temperature 97.8 F (36.6 C), resp. rate (!) 21, height 5\' 3"  (1.6 m), weight 88.3 kg, SpO2 100%. Body mass index is 34.48 kg/m.   Social History   Tobacco Use  Smoking Status Former   Current packs/day: 0.00   Average packs/day: 1.5 packs/day for 39.1 years (58.6 ttl pk-yrs)   Types: Cigarettes   Start date: 03/13/1977   Quit date: 04/10/2016   Years since quitting: 6.6   Passive exposure: Past  Smokeless Tobacco Never   Tobacco Cessation:  A prescription for an FDA-approved tobacco cessation medication was offered at discharge and the patient refused   Blood Alcohol level:  Lab Results  Component Value Date   [facility] <10 11/10/2022   ETH <10 06/21/2022    Metabolic Disorder Labs:  Lab Results  Component  Value Date   HGBA1C 5.0 01/05/2022   MPG 99.67 06/17/2021   MPG 105.41 07/23/2020   No results found for: "PROLACTIN" Lab Results  Component Value Date   CHOL 151 11/14/2022   TRIG 100 11/14/2022   HDL 65 11/14/2022   CHOLHDL 2.3 11/14/2022   VLDL 20 11/14/2022   LDLCALC 66 11/14/2022   LDLCALC 54  08/05/2018    See Psychiatric Specialty Exam and Suicide Risk Assessment completed by Attending Physician prior to discharge.  Discharge destination:  Home  Is patient on multiple antipsychotic therapies at discharge:  No   Has Patient had three or more failed trials of antipsychotic monotherapy by history:  No  Recommended Plan for Multiple Antipsychotic Therapies: NA   Allergies as of 11/22/2022       Reactions   Asa [aspirin] Other (See Comments)   GI bleeds   Hydrocodone Shortness Of Breath   Latuda [lurasidone Hcl] Anaphylaxis   Magnesium-containing Compounds Anaphylaxis, Other (See Comments)   Tolerated Ensure   Prednisone Anaphylaxis, Swelling, Other (See Comments)   Swelling of tongue, lips, throat   Ultram [tramadol] Anaphylaxis, Swelling, Rash   Codeine Nausea And Vomiting, Rash   Desyrel [trazodone] Other (See Comments)   Paranoia    Hydralazine Other (See Comments)   Muscle spasms   Peanut (diagnostic) Hives, Other (See Comments)   Raw peanuts only  OK to eat peanut butter.   Topamax [topiramate] Other (See Comments)   Paranoia   Sulfa Antibiotics Itching   Tape Rash        Medication List     STOP taking these medications    OLANZapine 10 MG tablet Commonly known as: ZYPREXA   Saphris 10 MG Subl Generic drug: Asenapine Maleate       TAKE these medications      Indication  albuterol 108 (90 Base) MCG/ACT inhaler Commonly known as: VENTOLIN HFA INHALE 2 PUFFS INTO THE LUNGS EVERY 6 HOURS AS NEEDED FOR WHEEZING OR SHORTNESS OF BREATH    alteplase 2 MG injection Commonly known as: CATHFLO ACTIVASE 2 mg by Intracatheter route once as needed for open catheter.    amLODipine 10 MG tablet Commonly known as: NORVASC Take 1 tablet (10 mg total) by mouth daily. What changed: Another medication with the same name was added. Make sure you understand how and when to take each.    amLODipine 5 MG tablet Commonly known as: NORVASC Take 1 tablet (5  mg total) by mouth every evening. What changed: You were already taking a medication with the same name, and this prescription was added. Make sure you understand how and when to take each.  Indication: High Blood Pressure   atorvastatin 20 MG tablet Commonly known as: LIPITOR Take 1 tablet (20 mg total) by mouth daily.    calcitRIOL 0.5 MCG capsule Commonly known as: ROCALTROL Take 1 capsule (0.5 mcg total) by mouth daily.    carvedilol 12.5 MG tablet Commonly known as: COREG Take 1 tablet (12.5 mg total) by mouth 2 (two) times daily. What changed: Another medication with the same name was added. Make sure you understand how and when to take each.    carvedilol 12.5 MG tablet Commonly known as: COREG Take 1 tablet (12.5 mg total) by mouth 2 (two) times daily with a meal. What changed: You were already taking a medication with the same name, and this prescription was added. Make sure you understand how and when to take each.  Indication: High Blood Pressure  cyclobenzaprine 10 MG tablet Commonly known as: FLEXERIL Take 1 tablet (10 mg total) by mouth 3 (three) times daily.    dicyclomine 10 MG capsule Commonly known as: BENTYL Take 1 capsule (10 mg total) by mouth 3 (three) times daily before meals.    DULoxetine 30 MG capsule Commonly known as: CYMBALTA Take 1 capsule (30 mg total) by mouth daily. What changed: Another medication with the same name was added. Make sure you understand how and when to take each.    DULoxetine 30 MG capsule Commonly known as: CYMBALTA Take 1 capsule (30 mg total) by mouth daily. What changed: You were already taking a medication with the same name, and this prescription was added. Make sure you understand how and when to take each.  Indication: Major Depressive Disorder   epoetin alfa 53664 UNIT/ML injection Commonly known as: EPOGEN Inject 1 mL (10,000 Units total) into the vein every Monday, Wednesday, and Friday with hemodialysis.   Indication: Anemia associated with Chronic Kidney Failure   escitalopram 20 MG tablet Commonly known as: LEXAPRO Take 1 tablet (20 mg total) by mouth daily.    ezetimibe 10 MG tablet Commonly known as: ZETIA Take 1 tablet (10 mg total) by mouth daily.    ferrous sulfate 325 (65 FE) MG tablet Take 1 tablet (325 mg total) by mouth every other day.    folic acid 1 MG tablet Commonly known as: FOLVITE Take 1 tablet (1 mg total) by mouth daily.    ibuprofen 200 MG tablet Commonly known as: ADVIL Take 400-600 mg by mouth daily.    lamoTRIgine 100 MG tablet Commonly known as: LAMICTAL Take 1 tablet (100 mg total) by mouth daily.    loperamide 2 MG capsule Commonly known as: IMODIUM Take 1 capsule (2 mg total) by mouth 2 (two) times daily.    melatonin 5 MG Tabs Take 1 tablet (5 mg total) by mouth at bedtime.  Indication: Trouble Sleeping   mirtazapine 7.5 MG tablet Commonly known as: REMERON Take 1 tablet (7.5 mg total) by mouth at bedtime.  Indication: Major Depressive Disorder   nystatin 100000 UNIT/ML suspension Commonly known as: MYCOSTATIN Take 5 mLs (500,000 Units total) by mouth 4 (four) times daily.    omeprazole 40 MG capsule Commonly known as: PRILOSEC Take 1 capsule (40 mg total) by mouth every morning.    potassium chloride SA 20 MEQ tablet Commonly known as: KLOR-CON M Take 20 mEq by mouth See admin instructions. Take 1 tablet by mouth for 3 days What changed: Another medication with the same name was removed. Continue taking this medication, and follow the directions you see here.    prazosin 2 MG capsule Commonly known as: MINIPRESS Take 1 capsule (2 mg total) by mouth at bedtime.    QUEtiapine 100 MG tablet Commonly known as: SEROQUEL Take 1 tablet (100 mg total) by mouth daily.  Indication: Depressive Phase of Manic-Depression, Trouble Sleeping   QUEtiapine 200 MG tablet Commonly known as: SEROQUEL Take 1 tablet (200 mg total) by mouth at  bedtime.  Indication: Depressive Phase of Manic-Depression, Trouble Sleeping   torsemide 20 MG tablet Commonly known as: DEMADEX Take 1 tablet (20 mg total) by mouth 2 (two) times daily.    Trelegy Ellipta 100-62.5-25 MCG/ACT Aepb Generic drug: Fluticasone-Umeclidin-Vilant INHALE 1 PUFF INTO THE LUNGS DAILY         Follow-up Information     [facility]. Go to.   Specialty: Urgent Care Contact information:  [address] [place] [address] [phone]                Follow-up recommendations: [facility] behavioral health   Signed: Sarina Ill, DO 11/22/2022, 10:22 AM

## 2022-11-28 ENCOUNTER — Other Ambulatory Visit: Payer: Self-pay | Admitting: *Deleted

## 2022-11-28 ENCOUNTER — Encounter (HOSPITAL_COMMUNITY): Payer: Self-pay

## 2022-11-28 ENCOUNTER — Other Ambulatory Visit: Payer: Self-pay

## 2022-11-28 ENCOUNTER — Other Ambulatory Visit (HOSPITAL_COMMUNITY): Payer: Self-pay

## 2022-11-28 ENCOUNTER — Telehealth: Payer: Self-pay | Admitting: *Deleted

## 2022-11-28 NOTE — Patient Outreach (Signed)
  Care Management   Follow Up Note   11/28/2022 Name: Tiffany [name] MRN: [id number] DOB: 06-06-1960   Referred by: [name], MD Reason for referral : Care Management (RNCM: Attempt to follow up for Chronic Disease Management & Care Coordination Needs.)   An unsuccessful telephone outreach was attempted today. The patient was referred to the case management team for assistance with care management and care coordination.   Follow Up Plan: The care management team will reach out to the patient again over the next 30-60 days.   Danise Edge, BSN RN RN Care Manager  [place]  [facility] Management  Direct Number: [phone]

## 2022-11-29 ENCOUNTER — Telehealth: Payer: Self-pay | Admitting: Internal Medicine

## 2022-11-29 NOTE — Telephone Encounter (Signed)
Pt c/o medication issue:  1. Name of Medication:  amLODipine (NORVASC) 5 MG tablet  2. How are you currently taking this medication (dosage and times per day)? As written  3. Are you having a reaction (difficulty breathing--STAT)? No   4. What is your medication issue? Home health  nurse said her blood pressure keeps dropping. She would like a call back.

## 2022-11-29 NOTE — Telephone Encounter (Signed)
LM for:  [facility] [phone]   Pt last OV was 04/2022 and possibly needs DOD/ APP appt.... she also follows with nephrology.

## 2022-11-30 NOTE — Telephone Encounter (Signed)
Spoke with Enrique Sack and she states patient reported that during dialysis her BP drops really low.   She would like to know if you can give parameters for her BP medications.

## 2022-11-30 NOTE — Telephone Encounter (Signed)
Spoke with Enrique Sack and she will contact dialysis MD

## 2022-11-30 NOTE — Telephone Encounter (Signed)
Home Health nurse Enrique Sack) returned RN's call.

## 2022-12-04 ENCOUNTER — Telehealth: Payer: Self-pay | Admitting: *Deleted

## 2022-12-04 NOTE — Progress Notes (Signed)
  Care Coordination Note  12/04/2022 Name: Tiffany [name] MRN: [id number] DOB: [birth year]  Tiffany [name] is a 62 y.o. year old female who is a primary care patient of [name], MD and is actively engaged with the care management team. I reached out to [name] by phone today to assist with re-scheduling a follow up visit with the RN Case Manager  Follow up plan: Telephone outreach attempt made patient is unavailable to take call and request a return call   [facility] Guide  Direct Dial: [phone]

## 2022-12-11 ENCOUNTER — Telehealth (HOSPITAL_COMMUNITY): Payer: Self-pay | Admitting: *Deleted

## 2022-12-11 IMAGING — MR MR LUMBAR SPINE W/O CM
4 of 5 series · 23 of 48 positions shown · non-contrast
Comparison: Lumbar radiographs 12/17/2013. CT Abdomen and Pelvis
10/30/2019. CTA chest 08/22/2015.

CLINICAL DATA: 59-year-old female with low back pain, bilateral
sciatica.

EXAM:
MRI LUMBAR SPINE WITHOUT CONTRAST
TECHNIQUE: Multiplanar, multisequence MR imaging of the lumbar spine was
performed. No intravenous contrast was administered.

[Series 3: T2 post-contrast · sagittal · 4.0mm · 0.53mm/px · 6 of 16 slices shown]
[im 1/16]
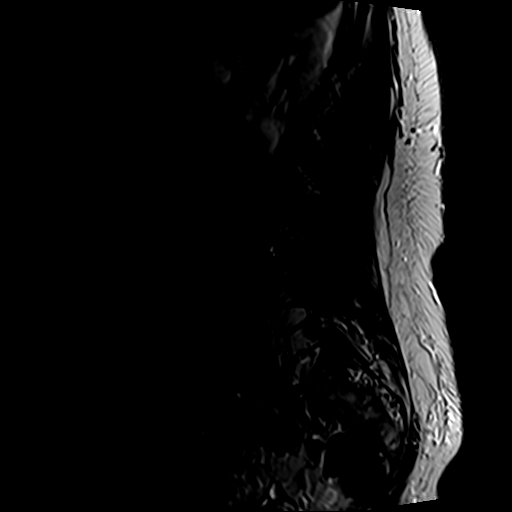
[im 4/16]
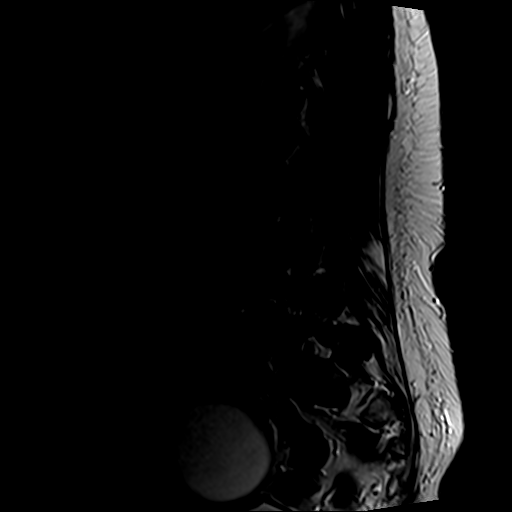
[im 7/16]
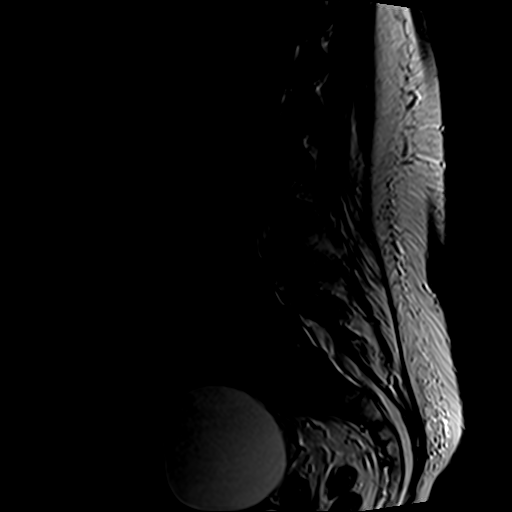
[im 10/16]
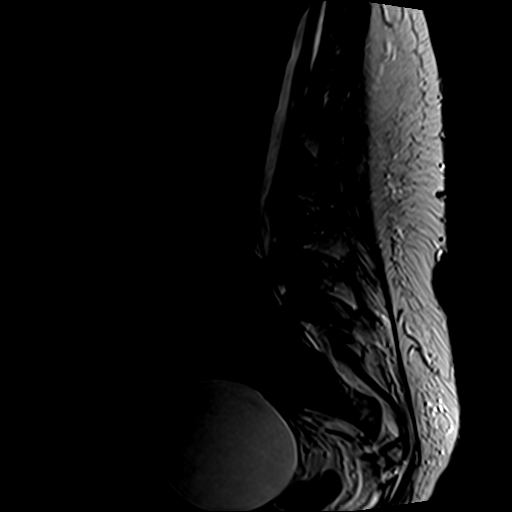
[im 13/16]
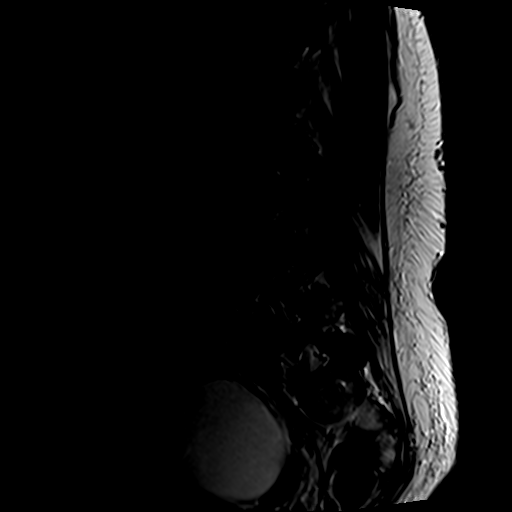
[im 16/16]
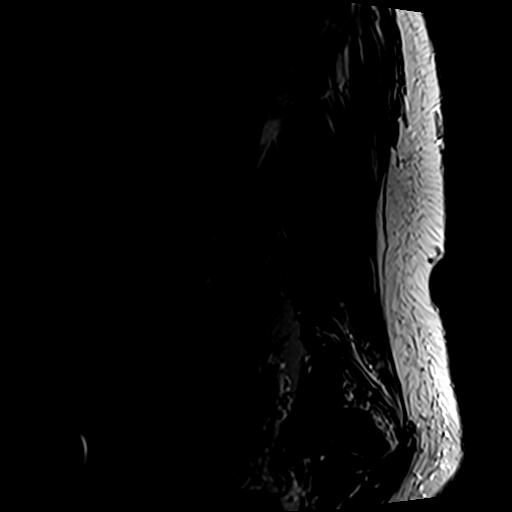

[Series 5: T1 · sagittal · 4.0mm · 0.53mm/px · 6 of 16 slices shown (1 of 2)]
[im 1/16]
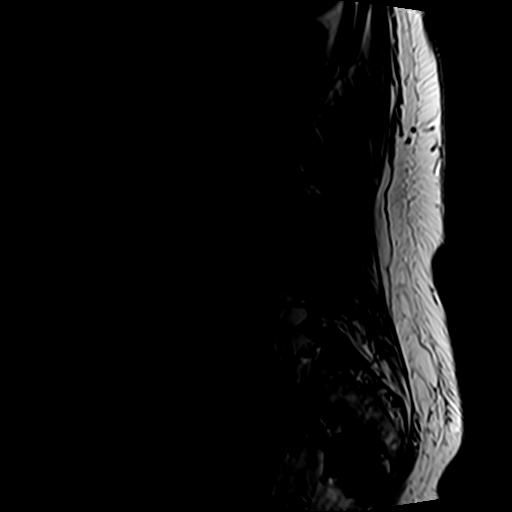
[im 3/16]
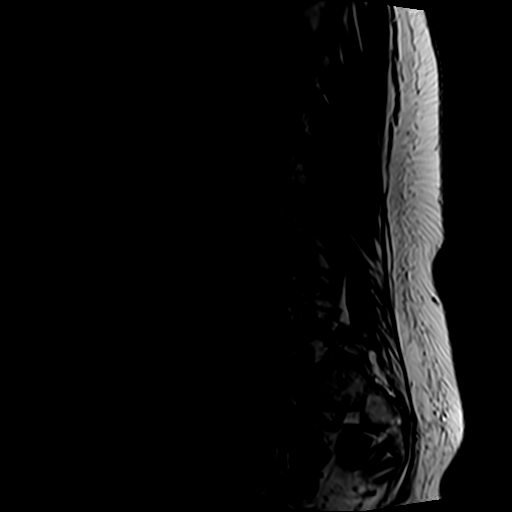
[im 6/16]
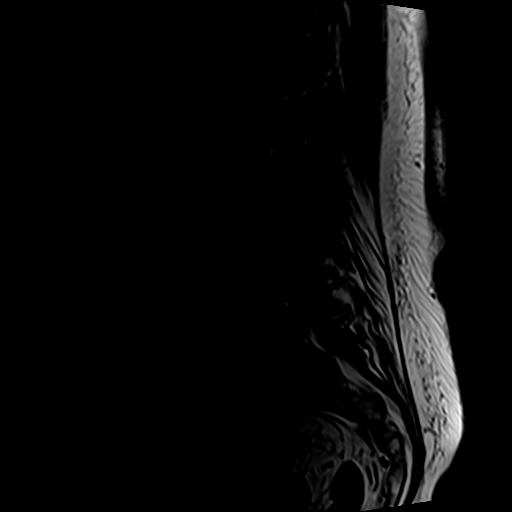
[im 8/16]
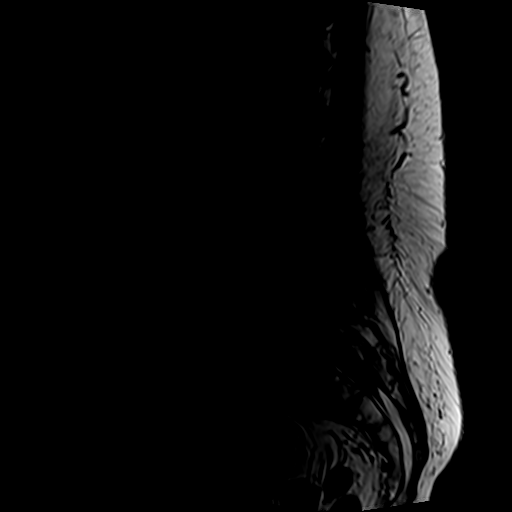
[im 11/16]
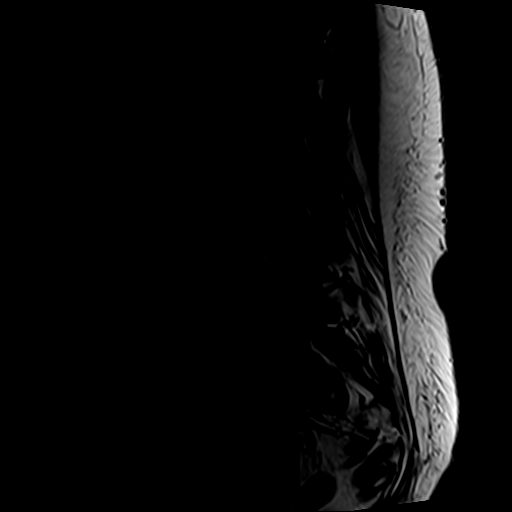
[im 13/16]
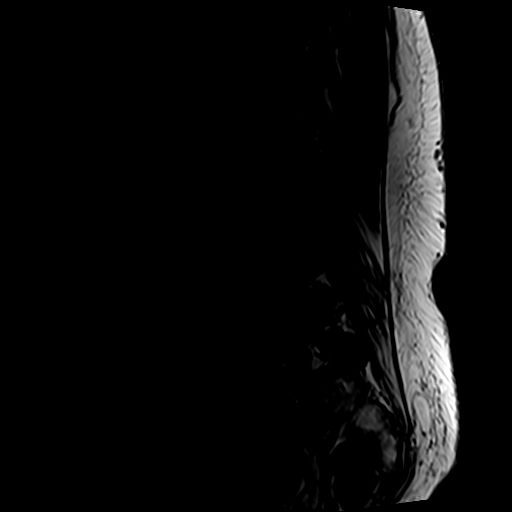

[Series 6: T2 · axial · 4.0mm · 0.70mm/px · z∈[-64,+127]mm · 8 of 35 slices shown]
[im 1/35]
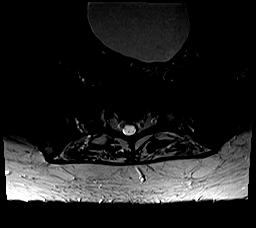
[im 6/35]
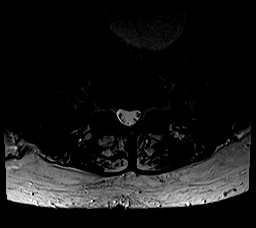
[im 11/35]
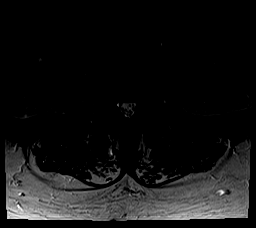
[im 16/35]
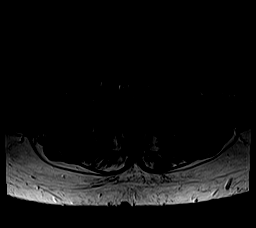
[im 19/35]
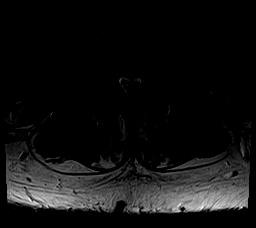
[im 24/35]
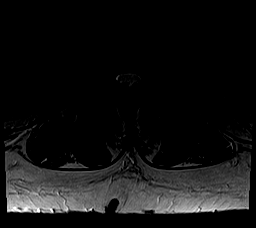
[im 29/35]
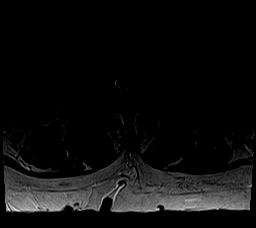
[im 35/35]
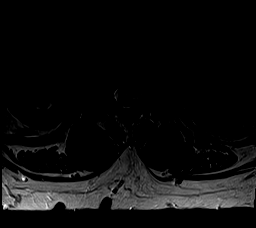

[Series 7: T1 · axial · 4.0mm · 0.35mm/px · z∈[-40,+96]mm · 3 of 35 slices shown (2 of 2)]
[im 6/35]
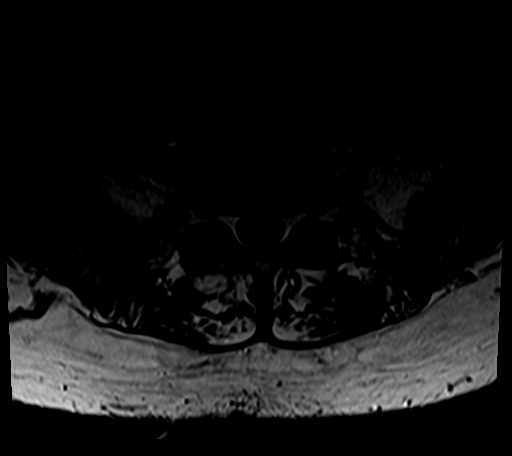
[im 19/35]
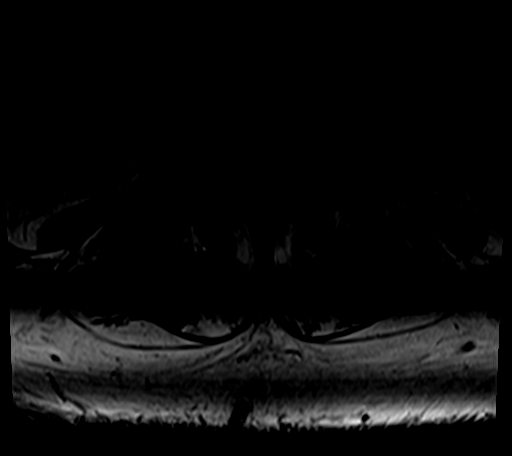
[im 29/35]
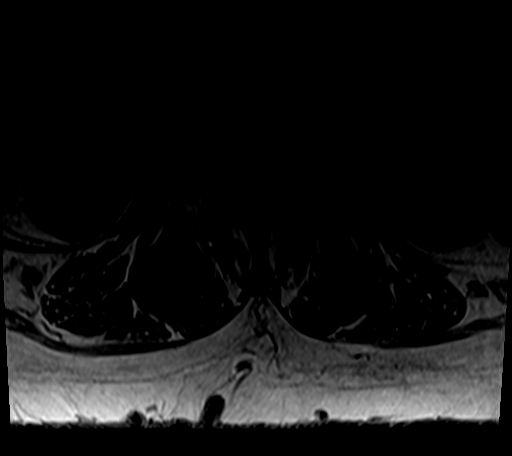

[23 of 48 positions shown; findings below may reference images not displayed]

FINDINGS: Segmentation: 12 full sized pairs of ribs, and 6 lumbar type
vertebral bodies demonstrated on prior CTs such that fully
lumbarized S1 level is designated for the purposes of this report.
Correlation with radiographs is recommended prior to any operative
intervention.

Alignment: Mildly exaggerated lumbar lordosis, stable since [REDACTED].
Mild grade 1 anterolisthesis at L4-L5 and L5-S1.

Vertebrae: No marrow edema or evidence of acute osseous abnormality.
Background bone marrow signal within normal limits. Intact visible
sacrum and SI joints.

Conus medullaris and cauda equina: Conus extends to the L1-L2 level.
r No lower spinal cord or conus signal abnormality.

Paraspinal and other soft tissues: Large, at least 7.4 cm diameter
cyst at the pelvic inlet redemonstrated and appear to arise from the
left ovary on the Nya CT. Size appears stable. Visible portions
of the cyst appear simple.

Negative visible abdominal viscera. Negative lumbar paraspinal soft
tissues.

Disc levels:

T12-L1:  Negative.

L1-L2:  Negative.

L2-L3:  Mild facet hypertrophy.  Otherwise negative.

L3-L4: Moderate facet and ligament flavum hypertrophy. Mild
foraminal disc bulging. No spinal or lateral recess stenosis.
Borderline to mild bilateral L3 foraminal stenosis.

L4-L5: Mild anterolisthesis with disc space loss and circumferential
disc bulge. Disc extrusion small superimposed midline and slightly
on series 3, image 9. Cephalad moderate to severe facet and ligament
flavum hypertrophy. Moderate to severe spinal stenosis. Moderate
bilateral lateral recess stenosis (L5 nerve levels). Mild to
moderate left and mild right L4 foraminal stenosis.

L5-S1: Grade 1 anterolisthesis with disc desiccation, disc space
loss, circumferential disc bulge. Moderate to severe ligament flavum
hypertrophy. Moderate to severe facet hypertrophy, although question
of developing facet ankylosis at this level on series 6 image 26).
Mild to moderate lateral recess stenosis greater on the left (left
S1 nerve level). Borderline to mild spinal stenosis. Mild to
moderate left and borderline right L5 foraminal stenosis.

S1-S2: Fully lumbarized. Mild circumferential disc bulge.
Superimposed small midline annular fissure of the disc on series 3,
image 9. Mild to moderate facet hypertrophy. No stenosis.
IMPRESSION: 1. Transitional lumbosacral anatomy with confirm 12 full size ribs
and 6 lumbar type vertebrae. Lumbarized S1 level with full size
S1-S2 disc space designated for the purposes of this report.
Correlation with radiographs is recommended prior to any operative
intervention.

2. Grade 1 anterolisthesis at L4-L5 and L5-S1 with disc and advanced
posterior element degeneration. Possible developing facet ankylosis
at the latter.
Subsequent multifactorial moderate to severe spinal, moderate
lateral recess and foraminal stenosis at L4-L5.
Up to moderate left lateral recess and foraminal stenosis at L5-S1.

3. Large, unilocular pelvic cyst (7.4 cm) appears stable since an
Nya CT Abdomen and Pelvis (please see that report).

## 2022-12-11 NOTE — Telephone Encounter (Signed)
Received fax from Louie Bun requesting permanent access for HD. Past consulted with [facility] 09/12/2022.  Will give to [facility].

## 2022-12-13 ENCOUNTER — Other Ambulatory Visit: Payer: Self-pay

## 2022-12-13 ENCOUNTER — Encounter (HOSPITAL_COMMUNITY): Payer: Self-pay

## 2022-12-13 DIAGNOSIS — N185 Chronic kidney disease, stage 5: Secondary | ICD-10-CM

## 2022-12-13 NOTE — Telephone Encounter (Signed)
Surgery scheduled for 12/21/22 with Dr. Lenell Antu due to patient does not wish to switch her dialysis days. Instructions provided and patient voiced understanding.

## 2022-12-19 NOTE — Progress Notes (Signed)
  Care Coordination Note  12/19/2022 Name: Tiffany [name] MRN: [id number] DOB: 04/03/1960  Tiffany [name] is a 62 y.o. year old female who is a primary care patient of [name], MD and is actively engaged with the care management team. I reached out to [name] by phone today to assist with re-scheduling a follow up visit with the RN Case Manager  Follow up plan: Unsuccessful telephone outreach attempt made. A HIPAA compliant phone message was left for the patient providing contact information and requesting a return call.  [facility]  Care Coordination Care Guide  Direct Dial: [phone]

## 2022-12-19 NOTE — Progress Notes (Signed)
  Care Coordination Note  12/19/2022 Name: Tiffany [name] MRN: [id number] DOB: 12-22-1960  Tiffany [name] is a 62 y.o. year old female who is a primary care patient of [name], MD and is actively engaged with the care management team. I reached out to [name] by phone today to assist with re-scheduling a follow up visit with the RN Case Manager  Follow up plan: Telephone appointment with care management team member scheduled for:01/02/23  [facility] Coordination Care Guide  Direct Dial: [phone]

## 2022-12-20 ENCOUNTER — Other Ambulatory Visit: Payer: Self-pay

## 2022-12-20 ENCOUNTER — Encounter (HOSPITAL_COMMUNITY): Payer: Self-pay | Admitting: Vascular Surgery

## 2022-12-20 NOTE — Progress Notes (Signed)
SDW CALL  Patient was given pre-op instructions over the phone. The opportunity was given for the patient to ask questions. No further questions asked. Patient verbalized understanding of instructions given.   PCP - Jocelyn Lamer Reese,MD Cardiologist - Dietrich Pates  PPM/ICD - denies Device Orders -  Rep Notified -   Chest x-ray - 11/07/22 EKG - 11/07/22 Stress Test - denies ECHO - 06/06/22 Cardiac Cath - 06/19/17  Sleep Study - 12/20/16 CPAP - does not use one  Fasting Blood Sugar - Does not check and does not have a meter. Says it is checked at dialysis where it usually runs around 110. She does not take any diabetic meds.  Checks Blood Sugar _____ times a day  Blood Thinner Instructions:na Aspirin Instructions:na  ERAS Protcol -no PRE-SURGERY Ensure or G2-   COVID TEST- na   Anesthesia review: yes-CHF,HTN,MI. Pt states she only uses O2 at dialysis and she thinks they put her on 3 liters. She  says she has not used it at home in more than 3 years.   Patient denies shortness of breath, fever, cough and chest pain over the phone call    Surgical Instructions    Your procedure is scheduled on December 5  Report to [facility] Main Entrance "A" at 1000 A.M., then check in with the Admitting office.  Call this number if you have problems the morning of surgery:  [phone]    Remember:  Do not eat or drink after midnight the night before your surgery     Take these medicines the morning of surgery with A SIP OF WATER: Amlodipine,Coreg,Cymbalta,Lexapro,Trelegy Ellipta,Lamictal(patient denies taking this ) and Albuterol inhaler if needed. Bring Albuterol inhaler to the hospital with you.  As of today, STOP taking any Aspirin (unless otherwise instructed by your surgeon) Aleve, Naproxen, Ibuprofen, Motrin, Advil, Goody's, BC's, all herbal medications, fish oil, and all vitamins.  [place] is not responsible for any belongings or valuables. .   Do NOT Smoke (Tobacco/Vaping)   24 hours prior to your procedure  If you use a CPAP at night, you may bring your mask for your overnight stay.   Contacts, glasses, hearing aids, dentures or partials may not be worn into surgery, please bring cases for these belongings   Patients discharged the day of surgery will not be allowed to drive home, and someone needs to stay with them for 24 hours.      Special instructions:    Oral Hygiene is also important to reduce your risk of infection.  Remember - BRUSH YOUR TEETH THE MORNING OF SURGERY WITH YOUR REGULAR TOOTHPASTE   Day of Surgery:  Take a shower the day of or night before with antibacterial soap. Wear Clean/Comfortable clothing the morning of surgery Do not apply any deodorants/lotions.   Do not wear jewelry or makeup Do not wear lotions, powders, perfumes/colognes, or deodorant. Do not shave 48 hours prior to surgery.  Men may shave face and neck. Do not bring valuables to the hospital. Do not wear nail polish, gel polish, artificial nails, or any other type of covering on natural nails (fingers and toes) If you have artificial nails or gel coating that need to be removed by a nail salon, please have this removed prior to surgery. Artificial nails or gel coating may interfere with anesthesia's ability to adequately monitor your vital signs. Remember to brush your teeth WITH YOUR REGULAR TOOTHPASTE.

## 2022-12-20 NOTE — Anesthesia Preprocedure Evaluation (Addendum)
Anesthesia Evaluation  Patient identified by MRN, date of birth, ID band Patient awake    Reviewed: Allergy & Precautions, H&P , NPO status , Patient's Chart, lab work & pertinent test results, reviewed documented beta blocker date and time   Airway Mallampati: III  TM Distance: >3 FB Neck ROM: Full    Dental no notable dental hx. (+) Edentulous Upper, Edentulous Lower, Dental Advisory Given   Pulmonary asthma , sleep apnea , COPD,  COPD inhaler, former smoker   Pulmonary exam normal breath sounds clear to auscultation       Cardiovascular hypertension, Pt. on medications and Pt. on home beta blockers + CAD, + Past MI and +CHF   Rhythm:Regular Rate:Normal     Neuro/Psych   Anxiety Depression Bipolar Disorder   CVA, Residual Symptoms negative neurological ROS     GI/Hepatic Neg liver ROS,GERD  Medicated,,  Endo/Other  diabetes    Renal/GU Renal InsufficiencyRenal disease  negative genitourinary   Musculoskeletal  (+) Arthritis , Osteoarthritis,    Abdominal   Peds  Hematology  (+) Blood dyscrasia, anemia   Anesthesia Other Findings   Reproductive/Obstetrics negative OB ROS                             Anesthesia Physical Anesthesia Plan  ASA: 3  Anesthesia Plan: General   Post-op Pain Management: Ofirmev IV (intra-op)*   Induction: Intravenous  PONV Risk Score and Plan: 4 or greater and Ondansetron, Dexamethasone, Propofol infusion and TIVA  Airway Management Planned: LMA  Additional Equipment:   Intra-op Plan:   Post-operative Plan: Extubation in OR  Informed Consent: I have reviewed the patients History and Physical, chart, labs and discussed the procedure including the risks, benefits and alternatives for the proposed anesthesia with the patient or authorized representative who has indicated his/her understanding and acceptance.     Dental advisory given  Plan Discussed  with: CRNA and Surgeon  Anesthesia Plan Comments: (PAT note written 12/20/2022 by Shonna Chock, PA-C. There is an FYI regarding difficult airway from emergent intubation in setting of PED arrest in 2018.  MAC and glidescope used, my note summarizes.   )       Anesthesia Quick Evaluation

## 2022-12-20 NOTE — Progress Notes (Signed)
Anesthesia Chart Review: SAME DAY WORK-UP  Case: [id number] Date/Time: 12/21/22 1145   Procedure: INSERTION OF LEFT ARM ARTERIOVENOUS (AV) GORE-TEX GRAFT (Left)   Anesthesia type: Choice   Pre-op diagnosis: Chronic kidney disease, stage 5   [facility] ROOM 16 / [facility]   Surgeons: Leonie Douglas, MD       DISCUSSION: Patient is a 62 year old female scheduled for the above procedure.  History includes former smoker (quit 04/10/16), schizoaffective disorder, HTN, HLD, DM2, CKD, COPD (asthmatic component), OSA/OHS (with mild secondary pulmonary hypertension,  48/18, mean 29 on 06/19/2017), PEA arrest (09/08/16 due to respiratory failure related to COPD exacerbation, discharged on 6-8L and Trilogy ventilator at night 09/2016), anoxic brain injury (due to PEA arrest 09/12/16), CVA (1980's), seizures (2018), PTSD, (home O2 (required 6-8L at use of Trilogy ventilator at night  in 2019), substance use (prior cocaine use).   DIFFICULT AIRWAY was noted during 09/08/16 emergent intubation at the time of her PEA arrest. Per intubation records. Induction was rapid sequence. A MAC 3 and Glidescope was used to place 7.5 ETT, one attempt. Per physician comments: Difficulty Due To: Difficulty was anticipated, Difficult Airway- due to large tongue, Difficult Airway- due to reduced neck mobility and Difficult Airway- due to limited oral opening Future Recommendations: Recommend- induction with short-acting agent, and alternative techniques readily available  [name] followed by cardiologist Dr. Tenny Craw for HFpEF. Last evaluation 05/05/22. She had normal coronaries and mild pulmonary HTN by 2019 cath.  Echo during May 2024 admission for fall with worsening edema in setting of worsening renal function and metabolic encephalopathy showed EF 60 to 65%, no regional wall motion abnormalities, grade I diastolic dysfunction, elevated LA pressure, normal RV systolic function, mild MR. Findings were felt stable since prior echo on  02/04/22, although with elevated meal LA pressure, findings suggested high output heart failure.  She improved with diuresis.   She started dialysis on 11/01/22. She now needs permanent HD access. Preoperative RN phone interview is still pending. Notes suggest she is not on continuous oxygen any longer.  Anesthesia team to evaluate on the day of surgery.     VS:  Wt Readings from Last 3 Encounters:  11/10/22 88.5 kg  06/21/22 93.8 kg  06/13/22 93.8 kg   BP Readings from Last 3 Encounters:  11/10/22 (!) 152/82  11/08/22 (!) 168/94  11/02/22 (!) 158/97   Pulse Readings from Last 3 Encounters:  11/10/22 64  11/08/22 80  11/02/22 93     PROVIDERS: [name], MD is PCP Dietrich Pates, MD is cardiologist, last visit 05/05/22.  Louie Bun, MD is nephrologist   LABS: For day of surgery. Most recent labs in [facility] include: Lab Results  Component Value Date   WBC 6.7 11/22/2022   HGB 8.2 (L) 11/22/2022   HCT 26.2 (L) 11/22/2022   PLT 269 11/22/2022   GLUCOSE 88 11/22/2022   ALT 15 11/11/2022   AST 23 11/11/2022   NA 135 11/22/2022   K 4.5 11/22/2022   CL 98 11/22/2022   CREATININE 3.42 (H) 11/22/2022   BUN 41 (H) 11/22/2022   CO2 27 11/22/2022   TSH 1.126 06/02/2022   INR 1.2 03/02/2022   HGBA1C 5.0 01/05/2022    OTHER: EEG 07/11/21: This study is within normal limits. No seizures or epileptiform discharges were seen throughout the recording.    IMAGES: CT Chest 11/07/22: MPRESSION: No acute abnormality in the chest. Cholelithiasis without complicating factors.  CT Abd/pelvis 11/07/22:  IMPRESSION: - Cholelithiasis without complicating factors. - Left adrenal lesion which is stable from the prior exam as well as dating back to 2022. This is consistent with a benign adenoma. No follow-up is recommended. - Cystic lesion in the left mid abdomen as described consistent with adnexal cyst. This has been present on multiple previous exams as well and is felt to be  benign in etiology. No further follow-up is recommended. - Chronic changes at L3-4 consistent with prior discitis.  - Diverticulosis without diverticulitis.   EKG: 11/07/22 Sinus rhythm Borderline left axis deviation Confirmed by Gerhard Munch [phone]) on 11/07/2022 1:05:32 PM   CV: Echo 06/06/22: IMPRESSIONS   1. Left ventricular ejection fraction, by estimation, is 60 to 65%. The  left ventricle has normal function. The left ventricle has no regional  wall motion abnormalities. Left ventricular diastolic parameters are  consistent with Grade I diastolic  dysfunction (impaired relaxation). Elevated left atrial pressure.   2. Right ventricular systolic function is normal. The right ventricular  size is normal.   3. Left atrial size was moderately dilated.   4. Right atrial size was moderately dilated.   5. The mitral valve is normal in structure. Mild mitral valve  regurgitation.   6. The aortic valve is tricuspid. Aortic valve regurgitation is not  visualized. No aortic stenosis is present.   Comparison(s): No significant change from prior study. Prior images  reviewed side by side. Although estimated mean left atrial pressure is  still elevated, it is less severe than on the 02/04/2022 study. Findings  suggest high output heart failure  (consider anemia, thyrotoxicosis, AF fistula, etc.).    US Carotid 07/10/21: Summary:  Right Carotid: Velocities in the right ICA are consistent with a 1-39%  stenosis.  Left Carotid: Velocities in the left ICA are consistent with a 1-39%  stenosis.  Vertebrals: Bilateral vertebral arteries demonstrate antegrade flow.  Subclavians: Normal flow hemodynamics were seen in bilateral subclavian               arteries.     RHC/LHC 06/19/17: Findings:   Ao = 117/72 (92) LV = 111/18 RA = 10 RV = 40/12 PA = 48/18 (29) PCW = 19 Fick cardiac output/index = 6.9/3.0 PVR = 1,5 WU Ao sat = 98% PA sat = 66%, 67%   Assessment:   1. Normal  coronary arteries 2. Normal LV function EF 60-65% 3. Mild PAH   Plan/Discussion:   Medical management. Needs weight loss.    Past Medical History:  Diagnosis Date   Abdominal pain    Acute encephalopathy 11/21/2017   Acute exacerbation of CHF (congestive heart failure) (HCC) 03/03/2022   Acute GI bleeding 03/29/2021   Acute kidney injury superimposed on chronic kidney disease (HCC) 02/18/2020   Acute metabolic encephalopathy 02/20/2020   Acute on chronic diastolic heart failure (HCC) 02/04/2022   Acute on chronic renal failure (HCC) 06/02/2022   Aggressive behavior    Agitation 11/22/2017   Anoxic brain injury (HCC) 09/08/2016   C. Arrest due to respiratory failure and COPD exacerbation   Anxiety    Arthritis    "all over" (04/10/2016)   Asthma 10/18/2010   Binge eating disorder    Blood loss anemia 04/24/2011   CAP (community acquired pneumonia) 06/22/2015   Cardiac arrest (HCC) 09/08/2016   PEA   Carotid artery stenosis    1-39% bilateral by dopplers 11/2016   Chronic diastolic (congestive) heart failure (HCC)    Chronic kidney disease, stage 3 (HCC)  Chronic kidney disease, stage 3b (HCC) 06/06/2021   Chronic pain syndrome 06/18/2012   Chronic post-traumatic stress disorder (PTSD) 05/27/2018   Chronic respiratory failure with hypoxia and hypercapnia (HCC) 06/22/2015   TRILOGY Vent >AVAPA-ES., Vt target 200-400, Max P 30 , PS max 20 , PS min 6-10 , E Max 6, E Min 4, Rate Auto AVAPS Rate 2 (titrate for pt comfort) , bleed O2 at 5l/m continuous flow .    CKD (chronic kidney disease) 02/03/2022   Closed displaced fracture of fifth metacarpal bone 03/21/2018   Cocaine use disorder, severe, in sustained remission (HCC) 12/17/2015   Complication of anesthesia    decreased bp, decreased heart rate   COPD (chronic obstructive pulmonary disease) (HCC) 07/08/2014   Delusional disorder, persecutory type (HCC) 06/26/2021   Depression    Diabetic neuropathy (HCC) 04/24/2011    Difficulty with speech 01/24/2018   Disorder of nervous system    Drug abuse (HCC) 11/21/2017   Dyslipidemia 04/24/2011   E. coli UTI 02/20/2020   Elevated troponin 04/28/2012   Emphysema    Encephalopathy 11/21/2017   Essential hypertension 03/22/2016   Facet arthropathy, lumbar 06/12/2022   Fibula fracture 07/10/2016   Frequent falls 10/11/2017   GERD (gastroesophageal reflux disease)    GI bleed 03/30/2021   Gout 04/11/2017   Heart attack (HCC) 1980s   Hematochezia    History of blood transfusion 1994   "couldn't stop bleeding from my period"   History of drug abuse in remission (HCC) 11/28/2015   Quit in 2017   Hyperlipidemia 03/31/2021   Hyperlipidemia LDL goal <70    Hypocalcemia    Hypokalemia    Hypomagnesemia    Incontinence    Manic depression (HCC)    Morbid obesity (HCC) 10/18/2010   Obstructive sleep apnea 10/18/2010   On home oxygen therapy    "6L; 24/7" (04/10/2016)   OSA on CPAP    "wear mask sometimes" (04/10/2016)   Painless rectal bleeding 12/31/2015   Paranoid (HCC)    "sometimes; I'm on RX for it" (04/10/2016)   Periumbilical abdominal pain    Prolonged Q-T interval on ECG    Rectal bleeding 12/31/2015   Recurrent syncope 07/09/2021   Rhabdomyolysis 06/16/2021   Schizoaffective disorder, bipolar type (HCC) 04/05/2018   Seasonal allergies    Seborrheic keratoses 12/31/2013   Seizures (HCC)    "don't know what kind; last one was ~ 1 yr ago" (04/10/2016)   Sinus bradycardia    Skin ulcer of sacrum, limited to breakdown of skin (HCC) 03/08/2022   Spondylolisthesis at L3-L4 level 06/12/2022   Stroke (HCC) 1980s   denies residual on 04/10/2016   Thrush 09/19/2013   Type 2 diabetes mellitus (HCC) 10/18/2010    Past Surgical History:  Procedure Laterality Date   CESAREAN SECTION  1997   COLONOSCOPY WITH PROPOFOL N/A 04/01/2021   Procedure: COLONOSCOPY WITH PROPOFOL;  Surgeon: Jeani Hawking, MD;  Location[facility] ENDOSCOPY;  Service: Gastroenterology;   Laterality: N/A;  Rectal bleeding with drop in hemoglobin to 7.2 g/dL   HERNIA REPAIR     IR CHOLANGIOGRAM EXISTING TUBE  07/20/2016   IR PERC CHOLECYSTOSTOMY  05/10/2016   IR RADIOLOGIST EVAL & MGMT  06/08/2016   IR RADIOLOGIST EVAL & MGMT  06/29/2016   IR SINUS/FIST TUBE CHK-NON GI  07/12/2016   RIGHT/LEFT HEART CATH AND CORONARY ANGIOGRAPHY N/A 06/19/2017   Procedure: RIGHT/LEFT HEART CATH AND CORONARY ANGIOGRAPHY;  Surgeon: Dolores Patty, MD;  Location: [facility] INVASIVE CV LAB;  Service: Cardiovascular;  Laterality: N/A;   TIBIA IM NAIL INSERTION Right 07/12/2016   Procedure: INTRAMEDULLARY (IM) NAIL RIGHT TIBIA;  Surgeon: Tarry Kos, MD;  Location: [facility] OR;  Service: Orthopedics;  Laterality: Right;   UMBILICAL HERNIA REPAIR  ~ 1963   "that's why I don't have a belly button"   VAGINAL HYSTERECTOMY      MEDICATIONS: No current facility-administered medications for this encounter.    albuterol (VENTOLIN HFA) 108 (90 Base) MCG/ACT inhaler   alteplase (CATHFLO ACTIVASE) 2 MG injection   amLODipine (NORVASC) 10 MG tablet   amLODipine (NORVASC) 5 MG tablet   atorvastatin (LIPITOR) 20 MG tablet   calcitRIOL (ROCALTROL) 0.5 MCG capsule   carvedilol (COREG) 12.5 MG tablet   carvedilol (COREG) 12.5 MG tablet   cyclobenzaprine (FLEXERIL) 10 MG tablet   dicyclomine (BENTYL) 10 MG capsule   DULoxetine (CYMBALTA) 30 MG capsule   DULoxetine (CYMBALTA) 30 MG capsule   epoetin alfa (EPOGEN) 10000 UNIT/ML injection   escitalopram (LEXAPRO) 20 MG tablet   ezetimibe (ZETIA) 10 MG tablet   ferrous sulfate 325 (65 FE) MG tablet   Fluticasone-Umeclidin-Vilant (TRELEGY ELLIPTA) 100-62.5-25 MCG/ACT AEPB   folic acid (FOLVITE) 1 MG tablet   ibuprofen (ADVIL) 200 MG tablet   lamoTRIgine (LAMICTAL) 100 MG tablet   loperamide (IMODIUM) 2 MG capsule   melatonin 5 MG TABS   mirtazapine (REMERON) 7.5 MG tablet   nystatin (MYCOSTATIN) 100000 UNIT/ML suspension   omeprazole (PRILOSEC) 40 MG capsule    potassium chloride SA (KLOR-CON M) 20 MEQ tablet   prazosin (MINIPRESS) 2 MG capsule   QUEtiapine (SEROQUEL) 100 MG tablet   QUEtiapine (SEROQUEL) 200 MG tablet   torsemide (DEMADEX) 20 MG tablet    Shonna Chock, PA-C Surgical Short Stay/Anesthesiology [facility] Phone [phone] [facility] Phone [phone] 12/20/2022 2:59 PM

## 2022-12-21 ENCOUNTER — Other Ambulatory Visit: Payer: Self-pay

## 2022-12-21 ENCOUNTER — Encounter (HOSPITAL_COMMUNITY)
Admission: RE | Disposition: A | Discharge status: Home / Self Care | Payer: Self-pay | Source: Home / Self Care | Attending: Vascular Surgery

## 2022-12-21 ENCOUNTER — Other Ambulatory Visit (HOSPITAL_COMMUNITY): Payer: Self-pay

## 2022-12-21 ENCOUNTER — Ambulatory Visit (HOSPITAL_COMMUNITY)
Admission: RE | Admit: 2022-12-21 | Discharge: 2022-12-21 | Disposition: A | Discharge status: Home / Self Care | Payer: Medicare HMO | Attending: Vascular Surgery | Admitting: Vascular Surgery

## 2022-12-21 ENCOUNTER — Encounter (HOSPITAL_COMMUNITY): Payer: Self-pay | Admitting: Vascular Surgery

## 2022-12-21 ENCOUNTER — Ambulatory Visit (HOSPITAL_BASED_OUTPATIENT_CLINIC_OR_DEPARTMENT_OTHER): Payer: Self-pay | Admitting: Vascular Surgery

## 2022-12-21 ENCOUNTER — Ambulatory Visit (HOSPITAL_COMMUNITY): Payer: Medicare HMO | Admitting: Vascular Surgery

## 2022-12-21 DIAGNOSIS — I132 Hypertensive heart and chronic kidney disease with heart failure and with stage 5 chronic kidney disease, or end stage renal disease: Secondary | ICD-10-CM | POA: Insufficient documentation

## 2022-12-21 DIAGNOSIS — I5032 Chronic diastolic (congestive) heart failure: Secondary | ICD-10-CM | POA: Insufficient documentation

## 2022-12-21 DIAGNOSIS — Z992 Dependence on renal dialysis: Secondary | ICD-10-CM | POA: Diagnosis not present

## 2022-12-21 DIAGNOSIS — Z87891 Personal history of nicotine dependence: Secondary | ICD-10-CM | POA: Diagnosis not present

## 2022-12-21 DIAGNOSIS — N185 Chronic kidney disease, stage 5: Secondary | ICD-10-CM

## 2022-12-21 DIAGNOSIS — E1140 Type 2 diabetes mellitus with diabetic neuropathy, unspecified: Secondary | ICD-10-CM | POA: Diagnosis not present

## 2022-12-21 DIAGNOSIS — N186 End stage renal disease: Secondary | ICD-10-CM | POA: Insufficient documentation

## 2022-12-21 DIAGNOSIS — J9611 Chronic respiratory failure with hypoxia: Secondary | ICD-10-CM | POA: Diagnosis not present

## 2022-12-21 DIAGNOSIS — J4489 Other specified chronic obstructive pulmonary disease: Secondary | ICD-10-CM | POA: Insufficient documentation

## 2022-12-21 DIAGNOSIS — I509 Heart failure, unspecified: Secondary | ICD-10-CM | POA: Diagnosis not present

## 2022-12-21 DIAGNOSIS — E1122 Type 2 diabetes mellitus with diabetic chronic kidney disease: Secondary | ICD-10-CM | POA: Insufficient documentation

## 2022-12-21 DIAGNOSIS — Z8673 Personal history of transient ischemic attack (TIA), and cerebral infarction without residual deficits: Secondary | ICD-10-CM | POA: Insufficient documentation

## 2022-12-21 DIAGNOSIS — K219 Gastro-esophageal reflux disease without esophagitis: Secondary | ICD-10-CM | POA: Insufficient documentation

## 2022-12-21 DIAGNOSIS — F250 Schizoaffective disorder, bipolar type: Secondary | ICD-10-CM | POA: Diagnosis not present

## 2022-12-21 DIAGNOSIS — G4733 Obstructive sleep apnea (adult) (pediatric): Secondary | ICD-10-CM | POA: Insufficient documentation

## 2022-12-21 DIAGNOSIS — Z8674 Personal history of sudden cardiac arrest: Secondary | ICD-10-CM | POA: Insufficient documentation

## 2022-12-21 DIAGNOSIS — E785 Hyperlipidemia, unspecified: Secondary | ICD-10-CM | POA: Diagnosis not present

## 2022-12-21 DIAGNOSIS — I272 Pulmonary hypertension, unspecified: Secondary | ICD-10-CM | POA: Insufficient documentation

## 2022-12-21 HISTORY — PX: AV FISTULA PLACEMENT: SHX1204

## 2022-12-21 LAB — POCT I-STAT, CHEM 8
BUN: 25 mg/dL — ABNORMAL HIGH (ref 8–23)
Calcium, Ion: 1.22 mmol/L (ref 1.15–1.40)
Chloride: 96 mmol/L — ABNORMAL LOW (ref 98–111)
Creatinine, Ser: 3.2 mg/dL — ABNORMAL HIGH (ref 0.44–1.00)
Glucose, Bld: 79 mg/dL (ref 70–99)
HCT: 38 % (ref 36.0–46.0)
Hemoglobin: 12.9 g/dL (ref 12.0–15.0)
Potassium: 3.7 mmol/L (ref 3.5–5.1)
Sodium: 133 mmol/L — ABNORMAL LOW (ref 135–145)
TCO2: 30 mmol/L (ref 22–32)

## 2022-12-21 LAB — GLUCOSE, CAPILLARY
Glucose-Capillary: 76 mg/dL (ref 70–99)
Glucose-Capillary: 67 mg/dL — ABNORMAL LOW (ref 70–99)
Glucose-Capillary: 87 mg/dL (ref 70–99)
Glucose-Capillary: 92 mg/dL (ref 70–99)

## 2022-12-21 SURGERY — INSERTION OF ARTERIOVENOUS (AV) GORE-TEX GRAFT ARM
Anesthesia: General | Laterality: Left

## 2022-12-21 MED ORDER — SODIUM CHLORIDE 0.9% FLUSH: 10.0000 mL | Freq: Two times a day (BID) | INTRAVENOUS | Status: DC

## 2022-12-21 MED ORDER — LIDOCAINE 2% (20 MG/ML) 5 ML SYRINGE
INTRAMUSCULAR | Status: AC
  Filled 2022-12-21: qty 5

## 2022-12-21 MED ORDER — PROPOFOL 500 MG/50ML IV EMUL
INTRAVENOUS | Status: DC | PRN
  Administered 2022-12-21: 75 ug/kg/min via INTRAVENOUS

## 2022-12-21 MED ORDER — CEFAZOLIN SODIUM-DEXTROSE 2-4 GM/100ML-% IV SOLN
2.0000 g | INTRAVENOUS | Status: AC
  Administered 2022-12-21: 2 g via INTRAVENOUS
  Filled 2022-12-21: qty 100

## 2022-12-21 MED ORDER — ACETAMINOPHEN 500 MG PO TABS
ORAL_TABLET | ORAL | Status: AC
  Filled 2022-12-21: qty 2

## 2022-12-21 MED ORDER — ACETAMINOPHEN 500 MG PO TABS: 1000.0000 mg | ORAL_TABLET | Freq: Once | ORAL | Status: DC

## 2022-12-21 MED ORDER — CHLORHEXIDINE GLUCONATE 0.12 % MT SOLN
15.0000 mL | Freq: Once | OROMUCOSAL | Status: AC
  Administered 2022-12-21: 15 mL via OROMUCOSAL
  Filled 2022-12-21: qty 15

## 2022-12-21 MED ORDER — ONDANSETRON HCL 4 MG/2ML IJ SOLN
INTRAMUSCULAR | Status: AC
  Filled 2022-12-21: qty 2

## 2022-12-21 MED ORDER — LIDOCAINE HCL (PF) 1 % IJ SOLN
INTRAMUSCULAR | Status: DC | PRN
Start: 2022-12-21 — End: 2022-12-21
  Administered 2022-12-21: 10 mL

## 2022-12-21 MED ORDER — DIPHENHYDRAMINE HCL 50 MG/ML IJ SOLN
12.5000 mg | Freq: Once | INTRAMUSCULAR | Status: AC
  Administered 2022-12-21: 12.5 mg via INTRAVENOUS
  Filled 2022-12-21: qty 1

## 2022-12-21 MED ORDER — ORAL CARE MOUTH RINSE: 15.0000 mL | Freq: Once | OROMUCOSAL | Status: AC

## 2022-12-21 MED ORDER — PHENYLEPHRINE 80 MCG/ML (10ML) SYRINGE FOR IV PUSH (FOR BLOOD PRESSURE SUPPORT)
PREFILLED_SYRINGE | INTRAVENOUS | Status: AC
  Filled 2022-12-21: qty 10

## 2022-12-21 MED ORDER — OXYCODONE HCL 5 MG PO TABS
5.0000 mg | ORAL_TABLET | Freq: Four times a day (QID) | ORAL | 0 refills | Status: DC | PRN
  Filled 2022-12-21: qty 20, 5d supply, fill #0

## 2022-12-21 MED ORDER — FENTANYL CITRATE (PF) 250 MCG/5ML IJ SOLN
INTRAMUSCULAR | Status: DC | PRN
  Administered 2022-12-21 (×3): 50 ug via INTRAVENOUS

## 2022-12-21 MED ORDER — PHENYLEPHRINE HCL-NACL 20-0.9 MG/250ML-% IV SOLN
INTRAVENOUS | Status: DC | PRN
  Administered 2022-12-21: 30 ug/min via INTRAVENOUS

## 2022-12-21 MED ORDER — EPHEDRINE 5 MG/ML INJ
INTRAVENOUS | Status: AC
  Filled 2022-12-21: qty 5

## 2022-12-21 MED ORDER — PROPOFOL 10 MG/ML IV BOLUS
INTRAVENOUS | Status: AC
  Filled 2022-12-21: qty 20

## 2022-12-21 MED ORDER — HEPARIN 6000 UNIT IRRIGATION SOLUTION
Status: DC | PRN
  Administered 2022-12-21: 1

## 2022-12-21 MED ORDER — SODIUM CHLORIDE 0.9 % IV SOLN: INTRAVENOUS | Status: DC | PRN

## 2022-12-21 MED ORDER — 0.9 % SODIUM CHLORIDE (POUR BTL) OPTIME
TOPICAL | Status: DC | PRN
  Administered 2022-12-21: 1000 mL

## 2022-12-21 MED ORDER — FENTANYL CITRATE (PF) 250 MCG/5ML IJ SOLN
INTRAMUSCULAR | Status: AC
  Filled 2022-12-21: qty 5

## 2022-12-21 MED ORDER — CHLORHEXIDINE GLUCONATE 4 % EX SOLN: 60.0000 mL | Freq: Once | CUTANEOUS | Status: DC

## 2022-12-21 MED ORDER — DEXTROSE 50 % IV SOLN
INTRAVENOUS | Status: AC
  Filled 2022-12-21: qty 50

## 2022-12-21 MED ORDER — DEXAMETHASONE SODIUM PHOSPHATE 10 MG/ML IJ SOLN
INTRAMUSCULAR | Status: AC
  Filled 2022-12-21: qty 1

## 2022-12-21 MED ORDER — ROCURONIUM BROMIDE 10 MG/ML (PF) SYRINGE
PREFILLED_SYRINGE | INTRAVENOUS | Status: AC
  Filled 2022-12-21: qty 10

## 2022-12-21 MED ORDER — DEXTROSE 50 % IV SOLN
INTRAVENOUS | Status: DC | PRN
  Administered 2022-12-21: 25 mL via INTRAVENOUS

## 2022-12-21 MED ORDER — LIDOCAINE 2% (20 MG/ML) 5 ML SYRINGE
INTRAMUSCULAR | Status: DC | PRN
  Administered 2022-12-21: 60 mg via INTRAVENOUS

## 2022-12-21 MED ORDER — CHLORHEXIDINE GLUCONATE 4 % EX SOLN
60.0000 mL | Freq: Once | CUTANEOUS | Status: DC
Start: 2022-12-22 — End: 2022-12-21

## 2022-12-21 MED ORDER — PROPOFOL 10 MG/ML IV BOLUS
INTRAVENOUS | Status: DC | PRN
  Administered 2022-12-21 (×3): 50 mg via INTRAVENOUS

## 2022-12-21 MED ORDER — FENTANYL CITRATE (PF) 100 MCG/2ML IJ SOLN: 25.0000 ug | INTRAMUSCULAR | Status: DC | PRN

## 2022-12-21 SURGICAL SUPPLY — 39 items
ARMBAND PINK RESTRICT EXTREMIT (MISCELLANEOUS) ×2 IMPLANT
BENZOIN TINCTURE PRP APPL 2/3 (GAUZE/BANDAGES/DRESSINGS) ×2 IMPLANT
CANISTER SUCT 3000ML PPV (MISCELLANEOUS) ×2 IMPLANT
CANNULA VESSEL 3MM 2 BLNT TIP (CANNULA) ×2 IMPLANT
CHLORAPREP W/TINT 26 (MISCELLANEOUS) ×2 IMPLANT
CLIP LIGATING EXTRA MED SLVR (CLIP) ×2 IMPLANT
CLIP LIGATING EXTRA SM BLUE (MISCELLANEOUS) ×2 IMPLANT
CLSR STERI-STRIP ANTIMIC 1/2X4 (GAUZE/BANDAGES/DRESSINGS) IMPLANT
DRSG TEGADERM 4X4.75 (GAUZE/BANDAGES/DRESSINGS) IMPLANT
ELECT REM PT RETURN 9FT ADLT (ELECTROSURGICAL) ×1
ELECTRODE REM PT RTRN 9FT ADLT (ELECTROSURGICAL) ×2 IMPLANT
GLOVE BIO SURGEON STRL SZ8 (GLOVE) ×2 IMPLANT
GOWN STRL REUS W/ TWL LRG LVL3 (GOWN DISPOSABLE) ×4 IMPLANT
GOWN STRL REUS W/ TWL XL LVL3 (GOWN DISPOSABLE) ×2 IMPLANT
GRAFT GORETEX STRT 4-7X45 (Vascular Products) IMPLANT
HEMOSTAT SNOW SURGICEL 2X4 (HEMOSTASIS) IMPLANT
INSERT FOGARTY SM (MISCELLANEOUS) IMPLANT
KIT BASIN OR (CUSTOM PROCEDURE TRAY) ×2 IMPLANT
KIT TURNOVER KIT B (KITS) ×2 IMPLANT
LOOP VESSEL MINI RED (MISCELLANEOUS) IMPLANT
NDL 18GX1X1/2 (RX/OR ONLY) (NEEDLE) IMPLANT
NEEDLE 18GX1X1/2 (RX/OR ONLY) (NEEDLE) IMPLANT
NS IRRIG 1000ML POUR BTL (IV SOLUTION) ×2 IMPLANT
PACK CV ACCESS (CUSTOM PROCEDURE TRAY) ×2 IMPLANT
PAD ARMBOARD 7.5X6 YLW CONV (MISCELLANEOUS) ×4 IMPLANT
SHEATH PROBE COVER 6X72 (BAG) ×2 IMPLANT
SLING ARM FOAM STRAP LRG (SOFTGOODS) IMPLANT
SLING ARM FOAM STRAP MED (SOFTGOODS) IMPLANT
STRIP CLOSURE SKIN 1/2X4 (GAUZE/BANDAGES/DRESSINGS) ×2 IMPLANT
SUT GORETEX 6.0 TT9 (SUTURE) ×2 IMPLANT
SUT MNCRL AB 4-0 PS2 18 (SUTURE) IMPLANT
SUT PROLENE 6 0 BV (SUTURE) IMPLANT
SUT SILK 2 0 SH (SUTURE) IMPLANT
SUT VIC AB 3-0 SH 27X BRD (SUTURE) ×4 IMPLANT
SYR 3ML LL SCALE MARK (SYRINGE) IMPLANT
SYR TOOMEY 50ML (SYRINGE) IMPLANT
TOWEL GREEN STERILE (TOWEL DISPOSABLE) ×2 IMPLANT
UNDERPAD 30X36 HEAVY ABSORB (UNDERPADS AND DIAPERS) ×2 IMPLANT
WATER STERILE IRR 1000ML POUR (IV SOLUTION) ×2 IMPLANT

## 2022-12-21 NOTE — Anesthesia Procedure Notes (Signed)
Procedure Name: LMA Insertion Date/Time: 12/21/2022 12:11 PM  Performed by: April Holding, CRNAPre-anesthesia Checklist: Patient identified, Emergency Drugs available, Suction available and Patient being monitored Patient Re-evaluated:Patient Re-evaluated prior to induction Oxygen Delivery Method: Circle System Utilized Preoxygenation: Pre-oxygenation with 100% oxygen Induction Type: IV induction Ventilation: Mask ventilation without difficulty LMA: LMA inserted LMA Size: 4.0 Number of attempts: 1 Airway Equipment and Method: Bite block Placement Confirmation: positive ETCO2 Tube secured with: Tape Dental Injury: Teeth and Oropharynx as per pre-operative assessment

## 2022-12-21 NOTE — Transfer of Care (Signed)
Immediate Anesthesia Transfer of Care Note  Patient: Tiffany [name]  Procedure(s) Performed: INSERTION OF LEFT ARM ARTERIOVENOUS (AV) GORE-TEX GRAFT (Left)  Patient Location: PACU  Anesthesia Type:General  Level of Consciousness: drowsy  Airway & Oxygen Therapy: Patient Spontanous Breathing and Patient connected to face mask oxygen  Post-op Assessment: Report given to RN and Post -op Vital signs reviewed and stable  Post vital signs: Reviewed and stable  Last Vitals:  Vitals Value Taken Time  BP 130/71 12/21/22 1326  Temp    Pulse 70 12/21/22 1329  Resp 17 12/21/22 1329  SpO2 100 % 12/21/22 1329  Vitals shown include unfiled device data.  Last Pain:  Vitals:   12/21/22 1032  TempSrc:   PainSc: 0-No pain         Complications: No notable events documented.

## 2022-12-21 NOTE — Op Note (Signed)
DATE OF SERVICE: 12/21/2022  PATIENT:  Tiffany [name]  62 y.o. female  PRE-OPERATIVE DIAGNOSIS:  ESRD  POST-OPERATIVE DIAGNOSIS:  Same  PROCEDURE:   Left upper arm arteriovenous graft  SURGEON:  Surgeons and Role:    * Leonie Douglas, MD - Primary  ASSISTANT: Aggie Moats, PA-C  An experienced assistant was required given the complexity of this procedure and the standard of surgical care. My assistant helped with exposure through counter tension, suctioning, ligation and retraction to better visualize the surgical field.  My assistant expedited sewing during the case by following my sutures. Wherever I use the term "we" in the report, my assistant actively helped me with that portion of the procedure.  ANESTHESIA:   general  EBL: minimal  BLOOD ADMINISTERED:none  DRAINS: none   LOCAL MEDICATIONS USED:  LIDOCAINE   SPECIMEN:  none  COUNTS: confirmed correct.  TOURNIQUET:  none  PATIENT DISPOSITION:  PACU - hemodynamically stable.   Delay start of Pharmacological VTE agent (>24hrs) due to surgical blood loss or risk of bleeding: no  INDICATION FOR PROCEDURE: ANADIA KURDI is a 62 y.o. female with ESRD on HD via TDC. After careful discussion of risks, benefits, and alternatives the patient was offered left arm arteriovenous graft. The patient understood and wished to proceed.  OPERATIVE FINDINGS: healthy brachial artery; healthy axillary vein. Good technical result. Good doppler bruit at completion. Good radial artery doppler signal.   DESCRIPTION OF PROCEDURE: After identification of the patient in the pre-operative holding area, the patient was transferred to the operating room. The patient was positioned supine on the operating room table. Anesthesia was induced. The left arm was prepped and draped in standard fashion. A surgical pause was performed confirming correct patient, procedure, and operative location.  The left brachial artery was exposed using a longitudinal  incision in the distal arm just above the antecubital fossa.  Incision was carried down through subcutaneous tissue until the brachial sheath was encountered.  This was incised sharply.  The brachial artery was exposed and encircled with Silastic Vesseloops proximally and distally to the site of planned inflow.   The left axillary vein was exposed using longitudinal incision just below the hairbearing area of the axilla.  Incision was carried down until the brachial sheath was encountered.  The brachial vein was identified, exposed, encircled with Silastic Vesseloops.   Using a curved, sheathed tunneling device, a 4-7 mm tapered Gore-Tex graft was tunneled subcutaneously and gentle arc across the biceps of the left arm.    The brachial artery was clamped proximally and distally.  An anterior arteriotomy was made with an 11 blade.  This was extended with Potts scissors.  The 4 mm end of the Gore-Tex graft was spatulated and then anastomosed end-to-side to the brachial arteriotomy using continuous running suture of 6-0 Prolene.  The anastomosis was completed and hemostasis ensured.  The graft was clamped to restore perfusion to the hand.   The brachial vein was clamped proximally and distally.  An anterior venotomy was made with an 11 blade.  This was extended with Potts scissors.  The 7 mm end of the Gore-Tex graft was then anastomosed end to side to the brachial vein venotomy using continuous running suture of 6-0 Prolene.  Immediately prior to completion the anastomosis was de-aired and flushed.  Anastomosis was then completed.  Hemostasis was insured.   Doppler machine was brought onto the field to interrogate the graft. Doppler flow was noted in the radial artery.  About the arterial anastomosis flow was noted proximal and distal to the arterial anastomosis.  Distal to the venous anastomosis a Doppler bruit was heard.  Satisfied we ended the case here.   Surgical beds were irrigated copiously.   Hemostasis was again ensured in the surgical beds.  The wounds were closed in layers using 3-0 Vicryl and 4-0 Monocryl.  Clean bandages were applied.  Upon completion of the case instrument and sharps counts were confirmed correct. The patient was transferred to the PACU in good condition. I was present for all portions of the procedure.  FOLLOW UP PLAN: Assuming a normal postoperative course, the graft can be cannulated in 4 weeks.   Rande Brunt. Lenell Antu, MD [facility] Phone Number: [phone] 12/21/2022 1:20 PM

## 2022-12-21 NOTE — Discharge Instructions (Signed)

## 2022-12-21 NOTE — Anesthesia Postprocedure Evaluation (Signed)
Anesthesia Post Note  Patient: Tiffany [name]  Procedure(s) Performed: INSERTION OF LEFT ARM ARTERIOVENOUS (AV) GORE-TEX GRAFT (Left)     Patient location during evaluation: PACU Anesthesia Type: General Level of consciousness: awake and alert Pain management: pain level controlled Vital Signs Assessment: post-procedure vital signs reviewed and stable Respiratory status: spontaneous breathing, nonlabored ventilation and respiratory function stable Cardiovascular status: blood pressure returned to baseline and stable Postop Assessment: no apparent nausea or vomiting Anesthetic complications: no  No notable events documented.  Last Vitals:  Vitals:   12/21/22 1345 12/21/22 1400  BP: 133/85 (!) 131/91  Pulse: 67 72  Resp: 12 14  Temp:    SpO2: 100% 96%    Last Pain:  Vitals:   12/21/22 1400  TempSrc:   PainSc: 0-No pain                 Oaklyn Jakubek,W. EDMOND

## 2022-12-21 NOTE — H&P (Signed)
See Dr. Ophelia Charter clinic note from August 2024 for full H&P details. Now ready for AVG. TDC in place. Plan L AVG placement in OR today.    Rande Brunt. Lenell Antu, MD [facility] Phone Number: [phone] 12/21/2022 11:29 AM     Patient name: Tiffany [name] MRN: [id number] DOB: 02-10-1960 Sex: female  REASON FOR CONSULT: Access placement   HPI: Tiffany [name] is a 62 y.o. female, with history of CHF, COPD, CKD stage 5, diabetes, stroke presents for evaluation of permanent dialysis access.  The referral from Dr. Glenna Fellows with [facility] states place AV fistula now and wait on AV graft.  Apparently had a recent acute kidney injury on CKD after cardiac arrest with diastolic heart failure.  Patient states she is right-handed.  No prior access in the past.  Not on dialysis.  No chest wall complaints.  Past Medical History:  Diagnosis Date   Abdominal pain    Acute encephalopathy 11/21/2017   Acute exacerbation of CHF (congestive heart failure) (HCC) 03/03/2022   Acute GI bleeding 03/29/2021   Acute kidney injury superimposed on chronic kidney disease (HCC) 02/18/2020   Acute metabolic encephalopathy 02/20/2020   Acute on chronic diastolic heart failure (HCC) 02/04/2022   Acute on chronic renal failure (HCC) 06/02/2022   Aggressive behavior    Agitation 11/22/2017   Anoxic brain injury (HCC) 09/08/2016   C. Arrest due to respiratory failure and COPD exacerbation   Anxiety    Arthritis    "all over" (04/10/2016)   Asthma 10/18/2010   Binge eating disorder    Blood loss anemia 04/24/2011   CAP (community acquired pneumonia) 06/22/2015   Cardiac arrest (HCC) 09/08/2016   PEA   Carotid artery stenosis    1-39% bilateral by dopplers 11/2016   Chronic diastolic (congestive) heart failure (HCC)    Chronic kidney disease, stage 3 (HCC)    Chronic kidney disease, stage 3b (HCC) 06/06/2021   Chronic pain syndrome 06/18/2012   Chronic  post-traumatic stress disorder (PTSD) 05/27/2018   Chronic respiratory failure with hypoxia and hypercapnia (HCC) 06/22/2015   TRILOGY Vent >AVAPA-ES., Vt target 200-400, Max P 30 , PS max 20 , PS min 6-10 , E Max 6, E Min 4, Rate Auto AVAPS Rate 2 (titrate for pt comfort) , bleed O2 at 5l/m continuous flow .    CKD (chronic kidney disease) 02/03/2022   Closed displaced fracture of fifth metacarpal bone 03/21/2018   Cocaine use disorder, severe, in sustained remission (HCC) 12/17/2015   Complication of anesthesia    decreased bp, decreased heart rate   COPD (chronic obstructive pulmonary disease) (HCC) 07/08/2014   Delusional disorder, persecutory type (HCC) 06/26/2021   Depression    Diabetic neuropathy (HCC) 04/24/2011   Difficulty with speech 01/24/2018   Disorder of nervous system    Drug abuse (HCC) 11/21/2017   Dyslipidemia 04/24/2011   E. coli UTI 02/20/2020   Elevated troponin 04/28/2012   Emphysema    Encephalopathy 11/21/2017   Essential hypertension 03/22/2016   Facet arthropathy, lumbar 06/12/2022   Fibula fracture 07/10/2016   Frequent falls 10/11/2017   GERD (gastroesophageal reflux disease)    GI bleed 03/30/2021   Gout 04/11/2017   Heart attack (HCC) 1980s   Hematochezia    History of blood transfusion 1994   "couldn't stop bleeding from my period"   History of drug abuse in remission (HCC) 11/28/2015   Quit in 2017   Hyperlipidemia 03/31/2021  Hyperlipidemia LDL goal <70    Hypocalcemia    Hypokalemia    Hypomagnesemia    Incontinence    Manic depression (HCC)    Morbid obesity (HCC) 10/18/2010   Obstructive sleep apnea 10/18/2010   On home oxygen therapy    "6L; 24/7" (04/10/2016)   OSA on CPAP    "wear mask sometimes" (04/10/2016)   Painless rectal bleeding 12/31/2015   Paranoid (HCC)    "sometimes; I'm on RX for it" (04/10/2016)   Periumbilical abdominal pain    Prolonged Q-T interval on ECG    Rectal bleeding 12/31/2015   Recurrent syncope  07/09/2021   Rhabdomyolysis 06/16/2021   Schizoaffective disorder, bipolar type (HCC) 04/05/2018   Seasonal allergies    Seborrheic keratoses 12/31/2013   Seizures (HCC)    "don't know what kind; last one was ~ 1 yr ago" (04/10/2016)   Sinus bradycardia    Skin ulcer of sacrum, limited to breakdown of skin (HCC) 03/08/2022   Spondylolisthesis at L3-L4 level 06/12/2022   Stroke (HCC) 1980s   denies residual on 04/10/2016   Thrush 09/19/2013   Type 2 diabetes mellitus (HCC) 10/18/2010    Past Surgical History:  Procedure Laterality Date   CESAREAN SECTION  1997   COLONOSCOPY WITH PROPOFOL N/A 04/01/2021   Procedure: COLONOSCOPY WITH PROPOFOL;  Surgeon: Jeani Hawking, MD;  Location: [facility] ENDOSCOPY;  Service: Gastroenterology;  Laterality: N/A;  Rectal bleeding with drop in hemoglobin to 7.2 g/dL   HERNIA REPAIR     IR CHOLANGIOGRAM EXISTING TUBE  07/20/2016   IR PERC CHOLECYSTOSTOMY  05/10/2016   IR RADIOLOGIST EVAL & MGMT  06/08/2016   IR RADIOLOGIST EVAL & MGMT  06/29/2016   IR SINUS/FIST TUBE CHK-NON GI  07/12/2016   RIGHT/LEFT HEART CATH AND CORONARY ANGIOGRAPHY N/A 06/19/2017   Procedure: RIGHT/LEFT HEART CATH AND CORONARY ANGIOGRAPHY;  Surgeon: Dolores Patty, MD;  Location: [facility] INVASIVE CV LAB;  Service: Cardiovascular;  Laterality: N/A;   TIBIA IM NAIL INSERTION Right 07/12/2016   Procedure: INTRAMEDULLARY (IM) NAIL RIGHT TIBIA;  Surgeon: Tarry Kos, MD;  Location: [facility] OR;  Service: Orthopedics;  Laterality: Right;   UMBILICAL HERNIA REPAIR  ~ 1963   "that's why I don't have a belly button"   VAGINAL HYSTERECTOMY      Family History  Problem Relation Age of Onset   Cancer Mother        lung   Depression Mother    Cancer Father        prostate   Depression Sister    Anxiety disorder Sister    Schizophrenia Sister    Bipolar disorder Sister    Depression Sister    Depression Brother    Heart failure Other        cousin    SOCIAL HISTORY: Social History    Socioeconomic History   Marital status: Widowed    Spouse name: Not on file   Number of children: 3   Years of education: 12th   Highest education level: 12th grade  Occupational History   Occupation: disabled    Comment: factory production  Tobacco Use   Smoking status: Former    Current packs/day: 0.00    Average packs/day: 1.5 packs/day for 39.1 years (58.6 ttl pk-yrs)    Types: Cigarettes    Start date: 03/13/1977    Quit date: 04/10/2016    Years since quitting: 6.7    Passive exposure: Past   Smokeless tobacco: Never  Vaping Use  Vaping status: Never Used  Substance and Sexual Activity   Alcohol use: No    Alcohol/week: 0.0 standard drinks of alcohol   Drug use: Not Currently    Types: Cocaine    Comment: 04/10/2016 "last used cocaine back in November 2017"   Sexual activity: Not Currently    Birth control/protection: Surgical  Other Topics Concern   Not on file  Social History Narrative   Has 1 son, [name]   Lives with son and his boyfriend   Her house has ramps and handrails should she ever needs them.    Her mother lives down the street from her and is a good support person in addition to her son.   She drives herself, has private transportation.    Cocaine free since 02/24/16, smoke free since 04/10/16   Social Determinants of Health   Financial Resource Strain: Low Risk  (07/11/2022)   Overall Financial Resource Strain (CARDIA)    Difficulty of Paying Living Expenses: Not very hard  Food Insecurity: Food Insecurity Present (11/10/2022)   Hunger Vital Sign    Worried About Running Out of Food in the Last Year: Sometimes true    Ran Out of Food in the Last Year: Sometimes true  Transportation Needs: No Transportation Needs (11/10/2022)   PRAPARE - Administrator, Civil Service (Medical): No    Lack of Transportation (Non-Medical): No  Physical Activity: Inactive (07/11/2022)   Exercise Vital Sign    Days of Exercise per Week: 0 days    Minutes of  Exercise per Session: 0 min  Stress: Stress Concern Present (07/11/2022)   [employer] of Occupational Health - Occupational Stress Questionnaire    Feeling of Stress : To some extent  Social Connections: Socially Isolated (07/11/2022)   Social Connection and Isolation Panel [NHANES]    Frequency of Communication with Friends and Family: Three times a week    Frequency of Social Gatherings with Friends and Family: Three times a week    Attends Religious Services: Never    Active Member of Clubs or Organizations: No    Attends Banker Meetings: Never    Marital Status: Widowed  Intimate Partner Violence: Not At Risk (11/10/2022)   Humiliation, Afraid, Rape, and Kick questionnaire    Fear of Current or Ex-Partner: No    Emotionally Abused: No    Physically Abused: No    Sexually Abused: No    Allergies  Allergen Reactions   Asa [Aspirin] Other (See Comments)    GI bleeds   Hydrocodone Shortness Of Breath   Latuda [Lurasidone Hcl] Anaphylaxis   Magnesium-Containing Compounds Anaphylaxis and Other (See Comments)    Tolerated Ensure   Prednisone Anaphylaxis, Swelling and Other (See Comments)    Swelling of tongue, lips, throat   Ultram [Tramadol] Anaphylaxis, Swelling and Rash   Codeine Nausea And Vomiting and Rash   Desyrel [Trazodone] Other (See Comments)    Paranoia    Hydralazine Other (See Comments)    Muscle spasms   Peanut (Diagnostic) Hives and Other (See Comments)    Raw peanuts only  OK to eat peanut butter.   Topamax [Topiramate] Other (See Comments)    Paranoia   Sulfa Antibiotics Itching   Tape Rash    Current Facility-Administered Medications  Medication Dose Route Frequency Provider Last Rate Last Admin   acetaminophen (TYLENOL) tablet 1,000 mg  1,000 mg Oral Once Gaynelle Adu, MD       ceFAZolin (ANCEF) IVPB 2g/100 mL  premix  2 g Intravenous 30 min Pre-Op Leonie Douglas, MD       chlorhexidine (HIBICLENS) 4 % liquid 4 Application   60 mL Topical Once Leonie Douglas, MD       And   [START ON 12/22/2022] chlorhexidine (HIBICLENS) 4 % liquid 4 Application  60 mL Topical Once Leonie Douglas, MD       sodium chloride flush (NS) 0.9 % injection 10 mL  10 mL Intravenous Q12H Gaynelle Adu, MD        REVIEW OF SYSTEMS:  [X]  denotes positive finding, [ ]  denotes negative finding Cardiac  Comments:  Chest pain or chest pressure:    Shortness of breath upon exertion:    Short of breath when lying flat:    Irregular heart rhythm:        Vascular    Pain in calf, thigh, or hip brought on by ambulation:    Pain in feet at night that wakes you up from your sleep:     Blood clot in your veins:    Leg swelling:         Pulmonary    Oxygen at home:    Productive cough:     Wheezing:         Neurologic    Sudden weakness in arms or legs:     Sudden numbness in arms or legs:     Sudden onset of difficulty speaking or slurred speech:    Temporary loss of vision in one eye:     Problems with dizziness:         Gastrointestinal    Blood in stool:     Vomited blood:         Genitourinary    Burning when urinating:     Blood in urine:        Psychiatric    Major depression:         Hematologic    Bleeding problems:    Problems with blood clotting too easily:        Skin    Rashes or ulcers:        Constitutional    Fever or chills:      PHYSICAL EXAM: Vitals:   12/21/22 1019  BP: 114/71  Pulse: 67  Resp: 20  Temp: 98.1 F (36.7 C)  TempSrc: Oral  SpO2: 99%  Weight: 88.5 kg  Height: 5\' 3"  (1.6 m)    GENERAL: The patient is a well-nourished female, in no acute distress. The vital signs are documented above. CARDIAC: There is a regular rate and rhythm.  VASCULAR:  Bilateral radial and brachial pulses palpable both upper extremities No chest wall implants. PULMONARY: No respiratory distress. ABDOMEN: Soft and non-tender. MUSCULOSKELETAL: There are no major deformities or  cyanosis. NEUROLOGIC: No focal weakness or paresthesias are detected. SKIN: There are no ulcers or rashes noted. PSYCHIATRIC: The patient has a normal affect.  DATA:   Vein mapping shows small cephalic and basilic vein in both upper extremities  Assessment/Plan:  62 y.o. female, with history of CHF, COPD, CKD stage 5, diabetes, stroke presents for evaluation of permanent access placement.  The referral from Dr. Glenna Fellows with [facility] states place AV fistula now and wait on AV graft.  Apparently had a recent acute kidney injury on CKD after cardiac arrest with diastolic heart failure.  I discussed that her vein mapping shows she has small surface veins in both arms.  The referral from nephrology  states placed AV fistula now and wait on AV graft.  I discussed since she does not have good surface veins we would likely be placing an AV graft in the left arm.  I discussed we will wait on scheduling this until we are contacted by her nephrologist or if the patient lets [name] know it is appropriate to proceed.  Discussed poor durability of AV graft.  Risk benefits of surgery discussed including risk of steal syndrome and failure to mature.   Rande Brunt. Lenell Antu, MD [facility] Phone Number: [phone] 12/21/2022 11:29 AM

## 2022-12-22 ENCOUNTER — Other Ambulatory Visit: Payer: Self-pay | Admitting: Student

## 2022-12-22 ENCOUNTER — Encounter (HOSPITAL_COMMUNITY): Payer: Self-pay | Admitting: Vascular Surgery

## 2022-12-28 ENCOUNTER — Other Ambulatory Visit: Payer: Self-pay

## 2022-12-28 ENCOUNTER — Other Ambulatory Visit (HOSPITAL_COMMUNITY): Payer: Self-pay

## 2023-01-01 ENCOUNTER — Other Ambulatory Visit (HOSPITAL_COMMUNITY): Payer: Self-pay

## 2023-01-02 ENCOUNTER — Other Ambulatory Visit (HOSPITAL_COMMUNITY): Payer: Self-pay

## 2023-01-02 ENCOUNTER — Telehealth: Payer: Self-pay

## 2023-01-02 ENCOUNTER — Other Ambulatory Visit: Payer: Self-pay

## 2023-01-02 ENCOUNTER — Other Ambulatory Visit: Payer: Self-pay | Admitting: *Deleted

## 2023-01-02 ENCOUNTER — Encounter (HOSPITAL_COMMUNITY): Payer: Self-pay

## 2023-01-02 NOTE — Telephone Encounter (Signed)
..  Caller: [name]  Concern: numbness & aching in arm she had surgery but also stated she did not want a stroke either because she had one some 15 years ago.  She can not recall if it was the same kind of feeling.  She states she does not want to go to the hospital, she does not have any transportation today because it is not a dialysis day.   Location: left hand - 2nd & 3rd digits  Description: constant numbness & aching x 2 days  Aggravating Factors: None known - ROM in hand and arm or making a fist do not worsen symptoms, denies any change in the way it feels  Quality: numbness, aching & noticeable   Procedure: AVG placement 12/21/22  Consulted: T. Lenell Antu, MD  Resolution: Recommended to Call 911 or go to the Emergency room for worsening symptoms including but not limited to pain, limb discoloration, swelling, limb coolness, loss of pulse, fever, or any stroke-like symptoms.   Next Appt: Appointment scheduled for 01/03/2023 @ 0915

## 2023-01-02 NOTE — Patient Outreach (Signed)
  Care Management   Visit Note  01/02/2023 Name: Tiffany [name] MRN: [id number] DOB: 07-21-1960  Subjective: Tiffany [name] is a 62 y.o. year old female who is a primary care patient of [name], MD. The Care Management team was consulted for assistance.      Engaged with patient spoke with patient by telephone.     Consent to Services:  Patient refused to participate with scheduled outreach on today. She states she no longer wants to participate with patient outreaches.   Plan: No further follow up required: Patient refused.  Danise Edge, BSN RN RN Care Manager  [place]  Direct Number: [phone]

## 2023-01-03 ENCOUNTER — Other Ambulatory Visit: Payer: Self-pay

## 2023-01-03 ENCOUNTER — Ambulatory Visit (INDEPENDENT_AMBULATORY_CARE_PROVIDER_SITE_OTHER): Payer: Medicare HMO | Admitting: Physician Assistant

## 2023-01-03 VITALS — BP 127/81 | HR 82 | Temp 98.2°F | Resp 22 | Ht 63.0 in | Wt 195.0 lb

## 2023-01-03 DIAGNOSIS — Z9889 Other specified postprocedural states: Secondary | ICD-10-CM

## 2023-01-03 MED ORDER — HYDROCODONE-ACETAMINOPHEN 5-325 MG PO TABS: 1.0000 | ORAL_TABLET | Freq: Four times a day (QID) | ORAL | 0 refills | Status: DC | PRN

## 2023-01-03 NOTE — Progress Notes (Signed)
POST OPERATIVE OFFICE NOTE    CC:  F/u for surgery  HPI:  This is a 62 y.o. female who is ESRD s/p Left UE AV graft placement on 12/21/22 on by Dr. .Lenell Antu.  She states she started having left hand pain this past Friday.    [name] currently on HD via [facility] right internal jugular placed else wear.  She has taken oxycodone, but this does not seem to help her pain.  She denies dropping objects, no healing wounds or rest pain that wakes her from sleep.       Allergies  Allergen Reactions   Asa [Aspirin] Other (See Comments)    GI bleeds   Hydrocodone Shortness Of Breath   Latuda [Lurasidone Hcl] Anaphylaxis   Magnesium-Containing Compounds Anaphylaxis and Other (See Comments)    Tolerated Ensure   Prednisone Anaphylaxis, Swelling and Other (See Comments)    Swelling of tongue, lips, throat   Ultram [Tramadol] Anaphylaxis, Swelling and Rash   Codeine Nausea And Vomiting and Rash   Desyrel [Trazodone] Other (See Comments)    Paranoia    Hydralazine Other (See Comments)    Muscle spasms   Peanut (Diagnostic) Hives and Other (See Comments)    Raw peanuts only  OK to eat peanut butter.   Topamax [Topiramate] Other (See Comments)    Paranoia   Sulfa Antibiotics Itching   Tape Rash    Current Outpatient Medications  Medication Sig Dispense Refill   albuterol (VENTOLIN HFA) 108 (90 Base) MCG/ACT inhaler INHALE 2 PUFFS INTO THE LUNGS EVERY 6 HOURS AS NEEDED FOR WHEEZING OR SHORTNESS OF BREATH 8.5 g 2   alteplase (CATHFLO ACTIVASE) 2 MG injection 2 mg by Intracatheter route once as needed for open catheter. 1 each 3   amLODipine (NORVASC) 10 MG tablet Take 1 tablet (10 mg total) by mouth daily. 30 tablet 5   atorvastatin (LIPITOR) 20 MG tablet Take 1 tablet (20 mg total) by mouth daily. 30 tablet 5   calcitRIOL (ROCALTROL) 0.5 MCG capsule Take 1 capsule (0.5 mcg total) by mouth daily. 30 capsule 5   carvedilol (COREG) 12.5 MG tablet Take 1 tablet (12.5 mg total) by mouth 2 (two) times  daily. 60 tablet 5   cyclobenzaprine (FLEXERIL) 10 MG tablet Take 1 tablet (10 mg total) by mouth 3 (three) times daily. 90 tablet 5   dicyclomine (BENTYL) 10 MG capsule Take 1 capsule (10 mg total) by mouth 3 (three) times daily before meals. 90 capsule 5   DULoxetine (CYMBALTA) 30 MG capsule Take 1 capsule (30 mg total) by mouth daily. 30 capsule 5   epoetin alfa (EPOGEN) 10000 UNIT/ML injection Inject 1 mL (10,000 Units total) into the vein every Monday, Wednesday, and Friday with hemodialysis. 1 mL 3   escitalopram (LEXAPRO) 20 MG tablet Take 1 tablet (20 mg total) by mouth daily. 30 tablet 5   ezetimibe (ZETIA) 10 MG tablet Take 1 tablet (10 mg total) by mouth daily. 30 tablet 5   ferrous sulfate 325 (65 FE) MG tablet Take 1 tablet (325 mg total) by mouth every other day. 30 tablet 5   Fluticasone-Umeclidin-Vilant (TRELEGY ELLIPTA) 100-62.5-25 MCG/ACT AEPB INHALE 1 PUFF INTO THE LUNGS DAILY 60 each 0   folic acid (FOLVITE) 1 MG tablet Take 1 tablet (1 mg total) by mouth daily. 30 tablet 5   ibuprofen (ADVIL) 200 MG tablet Take 400 mg by mouth 3 (three) times daily.     lamoTRIgine (LAMICTAL) 100 MG tablet Take 1  tablet (100 mg total) by mouth daily. 30 tablet 5   loperamide (IMODIUM) 2 MG capsule Take 1 capsule (2 mg total) by mouth 2 (two) times daily. 30 capsule 5   melatonin 5 MG TABS Take 1 tablet (5 mg total) by mouth at bedtime. 30 tablet 0   metolazone (ZAROXOLYN) 2.5 MG tablet Take 2.5 mg by mouth every Monday, Wednesday, and Friday.     mirtazapine (REMERON) 15 MG tablet Take 15 mg by mouth daily.     omeprazole (PRILOSEC) 40 MG capsule Take 1 capsule (40 mg total) by mouth every morning. 30 capsule 5   oxyCODONE (ROXICODONE) 5 MG immediate release tablet Take 1 tablet (5 mg total) by mouth every 6 (six) hours as needed. 20 tablet 0   potassium chloride SA (KLOR-CON M) 20 MEQ tablet Take 20 mEq by mouth See admin instructions. Take 1 tablet by mouth for 3 days     prazosin  (MINIPRESS) 2 MG capsule Take 1 capsule (2 mg total) by mouth at bedtime. 30 capsule 5   QUEtiapine (SEROQUEL) 100 MG tablet Take 1 tablet (100 mg total) by mouth daily. 30 tablet 3   QUEtiapine (SEROQUEL) 200 MG tablet Take 1 tablet (200 mg total) by mouth at bedtime. 30 tablet 3   torsemide (DEMADEX) 20 MG tablet Take 1 tablet (20 mg total) by mouth 2 (two) times daily. 60 tablet 2   No current facility-administered medications for this visit.     ROS:  See HPI  Physical Exam:    Incision:  well healed Extremities:  no edema, erythema surrounding tunnel for graft placement.  Excellent thrill, palpable radial pulses, cap refill is brisk.  Doppler signal ulnar and palmer intact.  Grip equal B UE.     Assessment/Plan:  This is a 62 y.o. female who is ESRD on HD right [facility] s/p:left UE AV graft placement.  She started having pain in the left hand Friday 12/29/22.  She denies rest pain that wakes her, non healing wounds. Or dropping objects.    -We discussed her options are to use the left UE for daily activies and I will give her 10 more pain tablets using Hydrocodone.  If the pain gets intolerable or she losses use of the left UE she will need the graft removed.        Mosetta Pigeon PA-C [facility] [phone]   Clinic MD:  Randie Heinz

## 2023-01-04 ENCOUNTER — Other Ambulatory Visit: Payer: Self-pay

## 2023-01-04 ENCOUNTER — Other Ambulatory Visit (HOSPITAL_COMMUNITY): Payer: Self-pay

## 2023-01-04 ENCOUNTER — Encounter (HOSPITAL_COMMUNITY): Payer: Self-pay

## 2023-01-09 ENCOUNTER — Other Ambulatory Visit: Payer: Self-pay

## 2023-01-09 ENCOUNTER — Telehealth: Payer: Self-pay | Admitting: *Deleted

## 2023-01-09 NOTE — Telephone Encounter (Signed)
Pt called today with complaint of numbness and pain in the hand regarding Left UA AV graft that was placed 12/21/22 with Dr. Lenell Antu. Pt was seen on 01/03/23 for this issue. PA stated if she continues to have pain then the graft will most likely need to be removed and gave pain medication. Pt was giving options  today regarding the same issue to either go to the ED or I can make her a office appointments after the holidays. Pt stated she can't stand the pain so I told her that the best thing to do was to go the the ED. Pt requested if we could just give her pain medication which was denied and pt was encouraged to go the ED.  Pt has her dialysis appointment today and states her treatment should be done around 3-4 pm and she guesses she will go after treatment. MD on call made aware.

## 2023-01-12 ENCOUNTER — Other Ambulatory Visit: Payer: Self-pay

## 2023-01-22 ENCOUNTER — Encounter (HOSPITAL_COMMUNITY): Payer: Self-pay

## 2023-01-22 ENCOUNTER — Other Ambulatory Visit: Payer: Self-pay

## 2023-01-25 ENCOUNTER — Other Ambulatory Visit (HOSPITAL_COMMUNITY): Payer: Self-pay

## 2023-01-25 ENCOUNTER — Other Ambulatory Visit: Payer: Self-pay

## 2023-01-29 ENCOUNTER — Encounter (HOSPITAL_COMMUNITY): Payer: Self-pay

## 2023-01-30 ENCOUNTER — Encounter (HOSPITAL_COMMUNITY): Payer: Self-pay

## 2023-01-30 ENCOUNTER — Ambulatory Visit (INDEPENDENT_AMBULATORY_CARE_PROVIDER_SITE_OTHER): Payer: HMO | Admitting: Vascular Surgery

## 2023-01-30 ENCOUNTER — Other Ambulatory Visit: Payer: Self-pay

## 2023-01-30 ENCOUNTER — Other Ambulatory Visit (HOSPITAL_COMMUNITY): Payer: Self-pay

## 2023-01-30 ENCOUNTER — Encounter: Payer: Self-pay | Admitting: Vascular Surgery

## 2023-01-30 ENCOUNTER — Ambulatory Visit (HOSPITAL_COMMUNITY)
Admission: RE | Admit: 2023-01-30 | Discharge: 2023-01-30 | Disposition: A | Discharge status: Home / Self Care | Payer: HMO | Source: Ambulatory Visit | Attending: Vascular Surgery | Admitting: Vascular Surgery

## 2023-01-30 ENCOUNTER — Other Ambulatory Visit (HOSPITAL_COMMUNITY): Payer: Self-pay | Admitting: Vascular Surgery

## 2023-01-30 VITALS — BP 147/82 | HR 80 | Temp 98.0°F | Resp 22 | Ht 63.0 in | Wt 195.0 lb

## 2023-01-30 DIAGNOSIS — N186 End stage renal disease: Secondary | ICD-10-CM | POA: Insufficient documentation

## 2023-01-30 DIAGNOSIS — Z992 Dependence on renal dialysis: Secondary | ICD-10-CM | POA: Insufficient documentation

## 2023-01-30 NOTE — Progress Notes (Signed)
 Patient name: PATIRICA LONGSHORE MRN: [id number] DOB: 02-Aug-1960 Sex: female  REASON FOR CONSULT: Clotted left arm AV graft  HPI: LAUREEN FREDERIC is a 63 y.o. female, with multiple medical problems including end-stage renal disease that presents for evaluation of clotted left arm AV graft.  She has a left arm AV graft that was placed on 12/21/2022 by Dr. Magda.  She states they used this several times until a couple days ago when it reportedly stopped working.  Past Medical History:  Diagnosis Date   Abdominal pain    Acute encephalopathy 11/21/2017   Acute exacerbation of CHF (congestive heart failure) (HCC) 03/03/2022   Acute GI bleeding 03/29/2021   Acute kidney injury superimposed on chronic kidney disease (HCC) 02/18/2020   Acute metabolic encephalopathy 02/20/2020   Acute on chronic diastolic heart failure (HCC) 02/04/2022   Acute on chronic renal failure (HCC) 06/02/2022   Aggressive behavior    Agitation 11/22/2017   Anoxic brain injury (HCC) 09/08/2016   C. Arrest due to respiratory failure and COPD exacerbation   Anxiety    Arthritis    all over (04/10/2016)   Asthma 10/18/2010   Binge eating disorder    Blood loss anemia 04/24/2011   CAP (community acquired pneumonia) 06/22/2015   Cardiac arrest (HCC) 09/08/2016   PEA   Carotid artery stenosis    1-39% bilateral by dopplers 11/2016   Chronic diastolic (congestive) heart failure (HCC)    Chronic kidney disease, stage 3 (HCC)    Chronic kidney disease, stage 3b (HCC) 06/06/2021   Chronic pain syndrome 06/18/2012   Chronic post-traumatic stress disorder (PTSD) 05/27/2018   Chronic respiratory failure with hypoxia and hypercapnia (HCC) 06/22/2015   TRILOGY Vent >AVAPA-ES., Vt target 200-400, Max P 30 , PS max 20 , PS min 6-10 , E Max 6, E Min 4, Rate Auto AVAPS Rate 2 (titrate for pt comfort) , bleed O2 at 5l/m continuous flow .    CKD (chronic kidney disease) 02/03/2022   Closed displaced fracture of fifth metacarpal  bone 03/21/2018   Cocaine use disorder, severe, in sustained remission (HCC) 12/17/2015   Complication of anesthesia    decreased bp, decreased heart rate   COPD (chronic obstructive pulmonary disease) (HCC) 07/08/2014   Delusional disorder, persecutory type (HCC) 06/26/2021   Depression    Diabetic neuropathy (HCC) 04/24/2011   Difficulty with speech 01/24/2018   Disorder of nervous system    Drug abuse (HCC) 11/21/2017   Dyslipidemia 04/24/2011   E. coli UTI 02/20/2020   Elevated troponin 04/28/2012   Emphysema    Encephalopathy 11/21/2017   Essential hypertension 03/22/2016   Facet arthropathy, lumbar 06/12/2022   Fibula fracture 07/10/2016   Frequent falls 10/11/2017   GERD (gastroesophageal reflux disease)    GI bleed 03/30/2021   Gout 04/11/2017   Heart attack (HCC) 1980s   Hematochezia    History of blood transfusion 1994   couldn't stop bleeding from my period   History of drug abuse in remission (HCC) 11/28/2015   Quit in 2017   Hyperlipidemia 03/31/2021   Hyperlipidemia LDL goal <70    Hypocalcemia    Hypokalemia    Hypomagnesemia    Incontinence    Manic depression (HCC)    Morbid obesity (HCC) 10/18/2010   Obstructive sleep apnea 10/18/2010   On home oxygen  therapy    6L; 24/7 (04/10/2016)   OSA on CPAP    wear mask sometimes (04/10/2016)   Painless rectal bleeding 12/31/2015  Paranoid (HCC)    sometimes; I'm on RX for it (04/10/2016)   Periumbilical abdominal pain    Prolonged Q-T interval on ECG    Rectal bleeding 12/31/2015   Recurrent syncope 07/09/2021   Rhabdomyolysis 06/16/2021   Schizoaffective disorder, bipolar type (HCC) 04/05/2018   Seasonal allergies    Seborrheic keratoses 12/31/2013   Seizures (HCC)    don't know what kind; last one was ~ 1 yr ago (04/10/2016)   Sinus bradycardia    Skin ulcer of sacrum, limited to breakdown of skin (HCC) 03/08/2022   Spondylolisthesis at L3-L4 level 06/12/2022   Stroke (HCC) 1980s   denies  residual on 04/10/2016   Thrush 09/19/2013   Type 2 diabetes mellitus (HCC) 10/18/2010    Past Surgical History:  Procedure Laterality Date   AV FISTULA PLACEMENT Left 12/21/2022   Procedure: INSERTION OF LEFT ARM ARTERIOVENOUS (AV) GORE-TEX GRAFT;  Surgeon: Magda Debby SAILOR, MD;  Location: [facility] OR;  Service: Vascular;  Laterality: Left;   CESAREAN SECTION  1997   COLONOSCOPY WITH PROPOFOL  N/A 04/01/2021   Procedure: COLONOSCOPY WITH PROPOFOL ;  Surgeon: Rollin Dover, MD;  Location: [facility] ENDOSCOPY;  Service: Gastroenterology;  Laterality: N/A;  Rectal bleeding with drop in hemoglobin to 7.2 g/dL   HERNIA REPAIR     IR CHOLANGIOGRAM EXISTING TUBE  07/20/2016   IR PERC CHOLECYSTOSTOMY  05/10/2016   IR RADIOLOGIST EVAL & MGMT  06/08/2016   IR RADIOLOGIST EVAL & MGMT  06/29/2016   IR SINUS/FIST TUBE CHK-NON GI  07/12/2016   RIGHT/LEFT HEART CATH AND CORONARY ANGIOGRAPHY N/A 06/19/2017   Procedure: RIGHT/LEFT HEART CATH AND CORONARY ANGIOGRAPHY;  Surgeon: Cherrie Toribio SAUNDERS, MD;  Location: [facility] INVASIVE CV LAB;  Service: Cardiovascular;  Laterality: N/A;   TIBIA IM NAIL INSERTION Right 07/12/2016   Procedure: INTRAMEDULLARY (IM) NAIL RIGHT TIBIA;  Surgeon: Jerri Kay HERO, MD;  Location: [facility] OR;  Service: Orthopedics;  Laterality: Right;   UMBILICAL HERNIA REPAIR  ~ 1963   that's why I don't have a belly button   VAGINAL HYSTERECTOMY      Family History  Problem Relation Age of Onset   Cancer Mother        lung   Depression Mother    Cancer Father        prostate   Depression Sister    Anxiety disorder Sister    Schizophrenia Sister    Bipolar disorder Sister    Depression Sister    Depression Brother    Heart failure Other        cousin    SOCIAL HISTORY: Social History   Socioeconomic History   Marital status: Widowed    Spouse name: Not on file   Number of children: 3   Years of education: 12th   Highest education level: 12th grade  Occupational History   Occupation:  disabled    Comment: factory production  Tobacco Use   Smoking status: Former    Current packs/day: 0.00    Average packs/day: 1.5 packs/day for 39.1 years (58.6 ttl pk-yrs)    Types: Cigarettes    Start date: 03/13/1977    Quit date: 04/10/2016    Years since quitting: 6.8    Passive exposure: Past   Smokeless tobacco: Never  Vaping Use   Vaping status: Never Used  Substance and Sexual Activity   Alcohol use: No    Alcohol/week: 0.0 standard drinks of alcohol   Drug use: Not Currently    Types: Cocaine  Comment: 04/10/2016 last used cocaine back in November 2017   Sexual activity: Not Currently    Birth control/protection: Surgical  Other Topics Concern   Not on file  Social History Narrative   Has 1 son, [name]   Lives with son and his boyfriend   Her house has ramps and handrails should she ever needs them.    Her mother lives down the street from her and is a good support person in addition to her son.   She drives herself, has private transportation.    Cocaine free since 02/24/16, smoke free since 04/10/16   Dialysis M-W-F   Social Drivers of Health   Financial Resource Strain: Low Risk  (07/11/2022)   Overall Financial Resource Strain (CARDIA)    Difficulty of Paying Living Expenses: Not very hard  Food Insecurity: Food Insecurity Present (11/10/2022)   Hunger Vital Sign    Worried About Running Out of Food in the Last Year: Sometimes true    Ran Out of Food in the Last Year: Sometimes true  Transportation Needs: No Transportation Needs (11/10/2022)   PRAPARE - Administrator, Civil Service (Medical): No    Lack of Transportation (Non-Medical): No  Physical Activity: Inactive (07/11/2022)   Exercise Vital Sign    Days of Exercise per Week: 0 days    Minutes of Exercise per Session: 0 min  Stress: Stress Concern Present (07/11/2022)   [employer] of Occupational Health - Occupational Stress Questionnaire    Feeling of Stress : To some extent   Social Connections: Socially Isolated (07/11/2022)   Social Connection and Isolation Panel [NHANES]    Frequency of Communication with Friends and Family: Three times a week    Frequency of Social Gatherings with Friends and Family: Three times a week    Attends Religious Services: Never    Active Member of Clubs or Organizations: No    Attends Banker Meetings: Never    Marital Status: Widowed  Intimate Partner Violence: Not At Risk (11/10/2022)   Humiliation, Afraid, Rape, and Kick questionnaire    Fear of Current or Ex-Partner: No    Emotionally Abused: No    Physically Abused: No    Sexually Abused: No    Allergies  Allergen Reactions   Asa [Aspirin ] Other (See Comments)    GI bleeds   Hydrocodone  Shortness Of Breath   Latuda [Lurasidone Hcl] Anaphylaxis   Magnesium -Containing Compounds Anaphylaxis and Other (See Comments)    Tolerated Ensure   Prednisone  Anaphylaxis, Swelling and Other (See Comments)    Swelling of tongue, lips, throat   Ultram  [Tramadol ] Anaphylaxis, Swelling and Rash   Codeine Nausea And Vomiting and Rash   Desyrel  [Trazodone ] Other (See Comments)    Paranoia    Hydralazine  Other (See Comments)    Muscle spasms   Peanut (Diagnostic) Hives and Other (See Comments)    Raw peanuts only  OK to eat peanut butter.   Topamax  [Topiramate ] Other (See Comments)    Paranoia   Sulfa Antibiotics Itching   Tape Rash    Current Outpatient Medications  Medication Sig Dispense Refill   albuterol  (VENTOLIN  HFA) 108 (90 Base) MCG/ACT inhaler INHALE 2 PUFFS INTO THE LUNGS EVERY 6 HOURS AS NEEDED FOR WHEEZING OR SHORTNESS OF BREATH 8.5 g 2   alteplase  (CATHFLO ACTIVASE ) 2 MG injection 2 mg by Intracatheter route once as needed for open catheter. 1 each 3   amLODipine  (NORVASC ) 10 MG tablet Take 1 tablet (10 mg  total) by mouth daily. 30 tablet 5   atorvastatin  (LIPITOR ) 20 MG tablet Take 1 tablet (20 mg total) by mouth daily. 30 tablet 5   calcitRIOL   (ROCALTROL ) 0.5 MCG capsule Take 1 capsule (0.5 mcg total) by mouth daily. 30 capsule 5   carvedilol  (COREG ) 12.5 MG tablet Take 1 tablet (12.5 mg total) by mouth 2 (two) times daily. 60 tablet 5   cyclobenzaprine  (FLEXERIL ) 10 MG tablet Take 1 tablet (10 mg total) by mouth 3 (three) times daily. 90 tablet 5   dicyclomine  (BENTYL ) 10 MG capsule Take 1 capsule (10 mg total) by mouth 3 (three) times daily before meals. 90 capsule 5   DULoxetine  (CYMBALTA ) 30 MG capsule Take 1 capsule (30 mg total) by mouth daily. 30 capsule 5   epoetin  alfa (EPOGEN ) 10000 UNIT/ML injection Inject 1 mL (10,000 Units total) into the vein every Monday, Wednesday, and Friday with hemodialysis. 1 mL 3   escitalopram  (LEXAPRO ) 20 MG tablet Take 1 tablet (20 mg total) by mouth daily. 30 tablet 5   ezetimibe  (ZETIA ) 10 MG tablet Take 1 tablet (10 mg total) by mouth daily. 30 tablet 5   ferrous sulfate  325 (65 FE) MG tablet Take 1 tablet (325 mg total) by mouth every other day. 30 tablet 5   Fluticasone -Umeclidin-Vilant (TRELEGY ELLIPTA ) 100-62.5-25 MCG/ACT AEPB INHALE 1 PUFF INTO THE LUNGS DAILY 60 each 0   folic acid  (FOLVITE ) 1 MG tablet Take 1 tablet (1 mg total) by mouth daily. 30 tablet 5   HYDROcodone -acetaminophen  (NORCO) 5-325 MG tablet Take 1 tablet by mouth every 6 (six) hours as needed for moderate pain (pain score 4-6). 10 tablet 0   ibuprofen  (ADVIL ) 200 MG tablet Take 400 mg by mouth 3 (three) times daily.     lamoTRIgine  (LAMICTAL ) 100 MG tablet Take 1 tablet (100 mg total) by mouth daily. 30 tablet 5   loperamide  (IMODIUM ) 2 MG capsule Take 1 capsule (2 mg total) by mouth 2 (two) times daily. 30 capsule 5   melatonin 5 MG TABS Take 1 tablet (5 mg total) by mouth at bedtime. 30 tablet 0   metolazone  (ZAROXOLYN ) 2.5 MG tablet Take 2.5 mg by mouth every Monday, Wednesday, and Friday.     mirtazapine  (REMERON ) 15 MG tablet Take 15 mg by mouth daily.     omeprazole  (PRILOSEC) 40 MG capsule Take 1 capsule (40 mg  total) by mouth every morning. 30 capsule 5   oxyCODONE  (ROXICODONE ) 5 MG immediate release tablet Take 1 tablet (5 mg total) by mouth every 6 (six) hours as needed. 20 tablet 0   potassium chloride  SA (KLOR-CON  M) 20 MEQ tablet Take 20 mEq by mouth See admin instructions. Take 1 tablet by mouth for 3 days     prazosin  (MINIPRESS ) 2 MG capsule Take 1 capsule (2 mg total) by mouth at bedtime. 30 capsule 5   QUEtiapine  (SEROQUEL ) 100 MG tablet Take 1 tablet (100 mg total) by mouth daily. 30 tablet 3   QUEtiapine  (SEROQUEL ) 200 MG tablet Take 1 tablet (200 mg total) by mouth at bedtime. 30 tablet 3   torsemide  (DEMADEX ) 20 MG tablet Take 1 tablet (20 mg total) by mouth 2 (two) times daily. 60 tablet 2   No current facility-administered medications for this visit.    REVIEW OF SYSTEMS:  [X]  denotes positive finding, [ ]  denotes negative finding Cardiac  Comments:  Chest pain or chest pressure:    Shortness of breath upon exertion:    Short  of breath when lying flat:    Irregular heart rhythm:        Vascular    Pain in calf, thigh, or hip brought on by ambulation:    Pain in feet at night that wakes you up from your sleep:     Blood clot in your veins:    Leg swelling:         Pulmonary    Oxygen  at home:    Productive cough:     Wheezing:         Neurologic    Sudden weakness in arms or legs:     Sudden numbness in arms or legs:     Sudden onset of difficulty speaking or slurred speech:    Temporary loss of vision in one eye:     Problems with dizziness:         Gastrointestinal    Blood in stool:     Vomited blood:         Genitourinary    Burning when urinating:     Blood in urine:        Psychiatric    Major depression:         Hematologic    Bleeding problems:    Problems with blood clotting too easily:        Skin    Rashes or ulcers:        Constitutional    Fever or chills:      PHYSICAL EXAM: Vitals:   01/30/23 1518  BP: (!) 147/82  Pulse: 80   Resp: (!) 22  Temp: 98 F (36.7 C)  TempSrc: Temporal  SpO2: 95%  Weight: 195 lb (88.5 kg)  Height: 5' 3 (1.6 m)    GENERAL: The patient is a well-nourished female, in no acute distress. The vital signs are documented above. CARDIAC: There is a regular rate and rhythm.  VASCULAR:  Left arm incisions healed Left radial pulse palpable PULMONARY: No respiratory distress. ABDOMEN: Soft and non-tender. MUSCULOSKELETAL: There are no major deformities or cyanosis. NEUROLOGIC: No focal weakness or paresthesias are detected.   DATA:   Duplex confirms occluded left arm AV graft  Assessment/Plan:  63 y.o. female, with multiple medical problems including end-stage renal disease that presents for evaluation of occluded left arm AV graft.  She has a left arm AV graft that was placed on 12/21/2022 by Dr. Magda.  Duplex confirms this is occluded today and she states this happened several days ago.  I have discussed her case with Dr. Melia and we discussed the option of percutaneous intervention versus open thrombectomy.  Given she is now 5 weeks postop [facility] feels they can attempt a percutaneous intervention.  I discussed with Ms. Deford that [facility] does the percutaneous thrombectomies and this will be arranged through the nephrology practice.  We will place an order to [facility].  Certainly if unsuccessful happy to see her back for new access.   Lonni DOROTHA Gaskins, MD [facility] [place] Office: [phone]

## 2023-01-31 ENCOUNTER — Other Ambulatory Visit: Payer: Self-pay

## 2023-01-31 ENCOUNTER — Telehealth: Payer: Self-pay

## 2023-02-01 ENCOUNTER — Encounter (HOSPITAL_COMMUNITY)
Admission: RE | Disposition: A | Discharge status: Home / Self Care | Payer: Self-pay | Source: Home / Self Care | Attending: Internal Medicine

## 2023-02-01 ENCOUNTER — Encounter (HOSPITAL_COMMUNITY): Payer: Self-pay | Admitting: Internal Medicine

## 2023-02-01 ENCOUNTER — Ambulatory Visit (HOSPITAL_COMMUNITY)
Admission: RE | Admit: 2023-02-01 | Discharge: 2023-02-01 | Disposition: A | Discharge status: Home / Self Care | Payer: HMO | Attending: Internal Medicine | Admitting: Internal Medicine

## 2023-02-01 ENCOUNTER — Other Ambulatory Visit: Payer: Self-pay

## 2023-02-01 DIAGNOSIS — T82858A Stenosis of vascular prosthetic devices, implants and grafts, initial encounter: Secondary | ICD-10-CM | POA: Diagnosis present

## 2023-02-01 DIAGNOSIS — Y832 Surgical operation with anastomosis, bypass or graft as the cause of abnormal reaction of the patient, or of later complication, without mention of misadventure at the time of the procedure: Secondary | ICD-10-CM | POA: Insufficient documentation

## 2023-02-01 DIAGNOSIS — Z7722 Contact with and (suspected) exposure to environmental tobacco smoke (acute) (chronic): Secondary | ICD-10-CM | POA: Diagnosis not present

## 2023-02-01 DIAGNOSIS — Z794 Long term (current) use of insulin: Secondary | ICD-10-CM | POA: Insufficient documentation

## 2023-02-01 DIAGNOSIS — E1122 Type 2 diabetes mellitus with diabetic chronic kidney disease: Secondary | ICD-10-CM | POA: Diagnosis not present

## 2023-02-01 DIAGNOSIS — J449 Chronic obstructive pulmonary disease, unspecified: Secondary | ICD-10-CM | POA: Insufficient documentation

## 2023-02-01 DIAGNOSIS — I5032 Chronic diastolic (congestive) heart failure: Secondary | ICD-10-CM | POA: Diagnosis not present

## 2023-02-01 DIAGNOSIS — Z992 Dependence on renal dialysis: Secondary | ICD-10-CM | POA: Insufficient documentation

## 2023-02-01 DIAGNOSIS — I132 Hypertensive heart and chronic kidney disease with heart failure and with stage 5 chronic kidney disease, or end stage renal disease: Secondary | ICD-10-CM | POA: Diagnosis not present

## 2023-02-01 DIAGNOSIS — N186 End stage renal disease: Secondary | ICD-10-CM | POA: Insufficient documentation

## 2023-02-01 DIAGNOSIS — I871 Compression of vein: Secondary | ICD-10-CM | POA: Diagnosis not present

## 2023-02-01 DIAGNOSIS — Z87891 Personal history of nicotine dependence: Secondary | ICD-10-CM | POA: Diagnosis not present

## 2023-02-01 DIAGNOSIS — Z8673 Personal history of transient ischemic attack (TIA), and cerebral infarction without residual deficits: Secondary | ICD-10-CM | POA: Diagnosis not present

## 2023-02-01 HISTORY — PX: A/V FISTULAGRAM: CATH118298

## 2023-02-01 SURGERY — A/V FISTULAGRAM
Anesthesia: LOCAL

## 2023-02-01 MED ORDER — SODIUM CHLORIDE 0.9 % IV SOLN: INTRAVENOUS | Status: DC

## 2023-02-01 MED ORDER — LIDOCAINE HCL (PF) 1 % IJ SOLN
INTRAMUSCULAR | Status: AC
  Filled 2023-02-01: qty 30

## 2023-02-01 MED ORDER — ONDANSETRON HCL 4 MG/2ML IJ SOLN: 4.0000 mg | Freq: Four times a day (QID) | INTRAMUSCULAR | Status: DC | PRN

## 2023-02-01 MED ORDER — ACETAMINOPHEN 325 MG PO TABS: 650.0000 mg | ORAL_TABLET | ORAL | Status: DC | PRN

## 2023-02-01 MED ORDER — SODIUM CHLORIDE 0.9% FLUSH: 3.0000 mL | INTRAVENOUS | Status: DC | PRN

## 2023-02-01 MED ORDER — HEPARIN (PORCINE) IN NACL 1000-0.9 UT/500ML-% IV SOLN
INTRAVENOUS | Status: DC | PRN
  Administered 2023-02-01: 500 mL

## 2023-02-01 MED ORDER — LIDOCAINE HCL (PF) 1 % IJ SOLN
INTRAMUSCULAR | Status: DC | PRN
  Administered 2023-02-01: 2 mL via SUBCUTANEOUS

## 2023-02-01 MED ORDER — MIDAZOLAM HCL 2 MG/2ML IJ SOLN
INTRAMUSCULAR | Status: AC
  Filled 2023-02-01: qty 2

## 2023-02-01 MED ORDER — FENTANYL CITRATE (PF) 100 MCG/2ML IJ SOLN
INTRAMUSCULAR | Status: AC
  Filled 2023-02-01: qty 2

## 2023-02-01 MED ORDER — HEPARIN SODIUM (PORCINE) 1000 UNIT/ML IJ SOLN
INTRAMUSCULAR | Status: AC
  Filled 2023-02-01: qty 10

## 2023-02-01 SURGICAL SUPPLY — 7 items
CATH BEACON 5 .035 65 KMP TIP (CATHETERS) IMPLANT
COVER DOME SNAP 22 D (MISCELLANEOUS) IMPLANT
GUIDEWIRE ANGLED .035X150CM (WIRE) IMPLANT
KIT MICROPUNCTURE NIT STIFF (SHEATH) IMPLANT
MAT PREVALON FULL STRYKER (MISCELLANEOUS) IMPLANT
SHEATH PINNACLE R/O II 7F 4CM (SHEATH) IMPLANT
TRAY PV CATH (CUSTOM PROCEDURE TRAY) IMPLANT

## 2023-02-01 NOTE — Discharge Instructions (Signed)
General care instructions: - You should be able to eat, drink, and resume your normal medications and normal activity. Potential complications: - Your hand is more cold or numb than usual. - You are bleeding at the site and it will not stop with direct pressure.- You have a fever, swelling, see redness or feel heat at or near the puncture site. Medication instructions: - Continue routine medications unless otherwise instructed. 4. Please have your sutures removed at your next scheduled dialysis treatment.

## 2023-02-01 NOTE — Op Note (Signed)
Patient presents for attempt at percutaneous thrombecomy of her LUE AVG placed 12/21/22 by Dr. Lenell Antu.  It was used for HD 1-2x then thrombosed.  She saw Dr. Chestine Spore earlier this week who recommended attempt at percutaneous thrombecomy.  If unsuccessful plan is new HD access. On exam the AVG is clotted.  L radial pulse 2+, ulnar present on doppler.   Summary:  1) Failed LUE AVG thrombectomy due to inability to pass guidewire pass venous anastomosis.    Description of procedure: The left  upper arm was prepped and draped in the usual fashion. The left upper arm straight AVG was cannulated (40981) in the arterial limb of the graft in an antegrade direction with an 21G Angiocath needle and then a 7 Fr sheath was inserted by guidewire exchange technique.  A hydrophilic guidewire was advanced but would not cross the [place].  A 5Fr angle tipped guiding catheter was inserted over the wire and used to provide leverage and redirect the direction of the catheter.  Despite numerous attempts the wire would not cross and the procedure was abandoned.   Hemostasis: Manual hemostasis achieved quickly at the site of the graft cannulation  Sedation: none  Sedation time: n/a  Contrast. 0 mL  Monitoring: Because of the patient's comorbid conditions continuous EKG monitoring and O2 saturation monitoring was performed throughout the procedure by the RN. There were no abnormal arrhythmias encountered.  Complications: None.   Diagnoses: I87.1 Stricture of vein  N18.6 ESRD T82.858A Stricture of access  Procedure Coding:  36901 Cannulation of dialysis AV access Q9967 Contrast  Recommendations:  1. Refer back to [facility] for new access - revision vs new 2.  Continue to use [facility] for HD access at this time.   Discharge: The patient was discharged home in stable condition. The patient was given education regarding the care of the dialysis access AVF and specific instructions in case of any problems.

## 2023-02-01 NOTE — H&P (Signed)
[facility]  INPATIENT CONSULTATION  Reason for Consultation: clotted AVG Requesting Provider: Dr. Chestine Spore  HPI: Tiffany [name] is an 63 y.o. female with multiple medical problems as listed below including CHF, COPD, chronic pain, h/o drug use, falls, DM, h/o CVA and fairly recent ESRD.  She had a LUE AVG placed by Dr. Lenell Antu 12/21/22.  She was able to use it a few times successfully but clotted last week.  She was referred back to [facility] and at a consult earlier this week Dr. Chestine Spore referred her to [name] for attempt at percutaneous thrombectomy with plans for new access if unsuccessful.  Pt has been using a TDC to dialyze in the meantime.   In reviewing notes at her first post op check she was c/o hand pain.  She says this self resolved even prior to the AVG thrombosing and she never had digital wounds.     PMH: Past Medical History:  Diagnosis Date   Abdominal pain    Acute encephalopathy 11/21/2017   Acute exacerbation of CHF (congestive heart failure) (HCC) 03/03/2022   Acute GI bleeding 03/29/2021   Acute kidney injury superimposed on chronic kidney disease (HCC) 02/18/2020   Acute metabolic encephalopathy 02/20/2020   Acute on chronic diastolic heart failure (HCC) 02/04/2022   Acute on chronic renal failure (HCC) 06/02/2022   Aggressive behavior    Agitation 11/22/2017   Anoxic brain injury (HCC) 09/08/2016   C. Arrest due to respiratory failure and COPD exacerbation   Anxiety    Arthritis    "all over" (04/10/2016)   Asthma 10/18/2010   Binge eating disorder    Blood loss anemia 04/24/2011   CAP (community acquired pneumonia) 06/22/2015   Cardiac arrest (HCC) 09/08/2016   PEA   Carotid artery stenosis    1-39% bilateral by dopplers 11/2016   Chronic diastolic (congestive) heart failure (HCC)    Chronic kidney disease, stage 3 (HCC)    Chronic kidney disease, stage 3b (HCC) 06/06/2021   Chronic pain syndrome 06/18/2012   Chronic post-traumatic stress disorder  (PTSD) 05/27/2018   Chronic respiratory failure with hypoxia and hypercapnia (HCC) 06/22/2015   TRILOGY Vent >AVAPA-ES., Vt target 200-400, Max P 30 , PS max 20 , PS min 6-10 , E Max 6, E Min 4, Rate Auto AVAPS Rate 2 (titrate for pt comfort) , bleed O2 at 5l/m continuous flow .    CKD (chronic kidney disease) 02/03/2022   Closed displaced fracture of fifth metacarpal bone 03/21/2018   Cocaine use disorder, severe, in sustained remission (HCC) 12/17/2015   Complication of anesthesia    decreased bp, decreased heart rate   COPD (chronic obstructive pulmonary disease) (HCC) 07/08/2014   Delusional disorder, persecutory type (HCC) 06/26/2021   Depression    Diabetic neuropathy (HCC) 04/24/2011   Difficulty with speech 01/24/2018   Disorder of nervous system    Drug abuse (HCC) 11/21/2017   Dyslipidemia 04/24/2011   E. coli UTI 02/20/2020   Elevated troponin 04/28/2012   Emphysema    Encephalopathy 11/21/2017   Essential hypertension 03/22/2016   Facet arthropathy, lumbar 06/12/2022   Fibula fracture 07/10/2016   Frequent falls 10/11/2017   GERD (gastroesophageal reflux disease)    GI bleed 03/30/2021   Gout 04/11/2017   Heart attack (HCC) 1980s   Hematochezia    History of blood transfusion 1994   "couldn't stop bleeding from my period"   History of drug abuse in remission (HCC) 11/28/2015   Quit in 2017   Hyperlipidemia  03/31/2021   Hyperlipidemia LDL goal <70    Hypocalcemia    Hypokalemia    Hypomagnesemia    Incontinence    Manic depression (HCC)    Morbid obesity (HCC) 10/18/2010   Obstructive sleep apnea 10/18/2010   On home oxygen therapy    "6L; 24/7" (04/10/2016)   OSA on CPAP    "wear mask sometimes" (04/10/2016)   Painless rectal bleeding 12/31/2015   Paranoid (HCC)    "sometimes; I'm on RX for it" (04/10/2016)   Periumbilical abdominal pain    Prolonged Q-T interval on ECG    Rectal bleeding 12/31/2015   Recurrent syncope 07/09/2021   Rhabdomyolysis  06/16/2021   Schizoaffective disorder, bipolar type (HCC) 04/05/2018   Seasonal allergies    Seborrheic keratoses 12/31/2013   Seizures (HCC)    "don't know what kind; last one was ~ 1 yr ago" (04/10/2016)   Sinus bradycardia    Skin ulcer of sacrum, limited to breakdown of skin (HCC) 03/08/2022   Spondylolisthesis at L3-L4 level 06/12/2022   Stroke (HCC) 1980s   denies residual on 04/10/2016   Thrush 09/19/2013   Type 2 diabetes mellitus (HCC) 10/18/2010   PSH: Past Surgical History:  Procedure Laterality Date   AV FISTULA PLACEMENT Left 12/21/2022   Procedure: INSERTION OF LEFT ARM ARTERIOVENOUS (AV) GORE-TEX GRAFT;  Surgeon: Leonie Douglas, MD;  Location: [facility] OR;  Service: Vascular;  Laterality: Left;   CESAREAN SECTION  1997   COLONOSCOPY WITH PROPOFOL N/A 04/01/2021   Procedure: COLONOSCOPY WITH PROPOFOL;  Surgeon: Jeani Hawking, MD;  Location: [facility] ENDOSCOPY;  Service: Gastroenterology;  Laterality: N/A;  Rectal bleeding with drop in hemoglobin to 7.2 g/dL   HERNIA REPAIR     IR CHOLANGIOGRAM EXISTING TUBE  07/20/2016   IR PERC CHOLECYSTOSTOMY  05/10/2016   IR RADIOLOGIST EVAL & MGMT  06/08/2016   IR RADIOLOGIST EVAL & MGMT  06/29/2016   IR SINUS/FIST TUBE CHK-NON GI  07/12/2016   RIGHT/LEFT HEART CATH AND CORONARY ANGIOGRAPHY N/A 06/19/2017   Procedure: RIGHT/LEFT HEART CATH AND CORONARY ANGIOGRAPHY;  Surgeon: Dolores Patty, MD;  Location: [facility] INVASIVE CV LAB;  Service: Cardiovascular;  Laterality: N/A;   TIBIA IM NAIL INSERTION Right 07/12/2016   Procedure: INTRAMEDULLARY (IM) NAIL RIGHT TIBIA;  Surgeon: Tarry Kos, MD;  Location: [facility] OR;  Service: Orthopedics;  Laterality: Right;   UMBILICAL HERNIA REPAIR  ~ 1963   "that's why I don't have a belly button"   VAGINAL HYSTERECTOMY      Past Medical History:  Diagnosis Date   Abdominal pain    Acute encephalopathy 11/21/2017   Acute exacerbation of CHF (congestive heart failure) (HCC) 03/03/2022   Acute GI bleeding  03/29/2021   Acute kidney injury superimposed on chronic kidney disease (HCC) 02/18/2020   Acute metabolic encephalopathy 02/20/2020   Acute on chronic diastolic heart failure (HCC) 02/04/2022   Acute on chronic renal failure (HCC) 06/02/2022   Aggressive behavior    Agitation 11/22/2017   Anoxic brain injury (HCC) 09/08/2016   C. Arrest due to respiratory failure and COPD exacerbation   Anxiety    Arthritis    "all over" (04/10/2016)   Asthma 10/18/2010   Binge eating disorder    Blood loss anemia 04/24/2011   CAP (community acquired pneumonia) 06/22/2015   Cardiac arrest (HCC) 09/08/2016   PEA   Carotid artery stenosis    1-39% bilateral by dopplers 11/2016   Chronic diastolic (congestive) heart failure (HCC)    Chronic  kidney disease, stage 3 (HCC)    Chronic kidney disease, stage 3b (HCC) 06/06/2021   Chronic pain syndrome 06/18/2012   Chronic post-traumatic stress disorder (PTSD) 05/27/2018   Chronic respiratory failure with hypoxia and hypercapnia (HCC) 06/22/2015   TRILOGY Vent >AVAPA-ES., Vt target 200-400, Max P 30 , PS max 20 , PS min 6-10 , E Max 6, E Min 4, Rate Auto AVAPS Rate 2 (titrate for pt comfort) , bleed O2 at 5l/m continuous flow .    CKD (chronic kidney disease) 02/03/2022   Closed displaced fracture of fifth metacarpal bone 03/21/2018   Cocaine use disorder, severe, in sustained remission (HCC) 12/17/2015   Complication of anesthesia    decreased bp, decreased heart rate   COPD (chronic obstructive pulmonary disease) (HCC) 07/08/2014   Delusional disorder, persecutory type (HCC) 06/26/2021   Depression    Diabetic neuropathy (HCC) 04/24/2011   Difficulty with speech 01/24/2018   Disorder of nervous system    Drug abuse (HCC) 11/21/2017   Dyslipidemia 04/24/2011   E. coli UTI 02/20/2020   Elevated troponin 04/28/2012   Emphysema    Encephalopathy 11/21/2017   Essential hypertension 03/22/2016   Facet arthropathy, lumbar 06/12/2022   Fibula fracture  07/10/2016   Frequent falls 10/11/2017   GERD (gastroesophageal reflux disease)    GI bleed 03/30/2021   Gout 04/11/2017   Heart attack (HCC) 1980s   Hematochezia    History of blood transfusion 1994   "couldn't stop bleeding from my period"   History of drug abuse in remission (HCC) 11/28/2015   Quit in 2017   Hyperlipidemia 03/31/2021   Hyperlipidemia LDL goal <70    Hypocalcemia    Hypokalemia    Hypomagnesemia    Incontinence    Manic depression (HCC)    Morbid obesity (HCC) 10/18/2010   Obstructive sleep apnea 10/18/2010   On home oxygen therapy    "6L; 24/7" (04/10/2016)   OSA on CPAP    "wear mask sometimes" (04/10/2016)   Painless rectal bleeding 12/31/2015   Paranoid (HCC)    "sometimes; I'm on RX for it" (04/10/2016)   Periumbilical abdominal pain    Prolonged Q-T interval on ECG    Rectal bleeding 12/31/2015   Recurrent syncope 07/09/2021   Rhabdomyolysis 06/16/2021   Schizoaffective disorder, bipolar type (HCC) 04/05/2018   Seasonal allergies    Seborrheic keratoses 12/31/2013   Seizures (HCC)    "don't know what kind; last one was ~ 1 yr ago" (04/10/2016)   Sinus bradycardia    Skin ulcer of sacrum, limited to breakdown of skin (HCC) 03/08/2022   Spondylolisthesis at L3-L4 level 06/12/2022   Stroke [facility]) 1980s   denies residual on 04/10/2016   Thrush 09/19/2013   Type 2 diabetes mellitus (HCC) 10/18/2010    Medications:  I have reviewed the patient's current medications.  Medications Prior to Admission  Medication Sig Dispense Refill   albuterol (VENTOLIN HFA) 108 (90 Base) MCG/ACT inhaler INHALE 2 PUFFS INTO THE LUNGS EVERY 6 HOURS AS NEEDED FOR WHEEZING OR SHORTNESS OF BREATH 8.5 g 2   alteplase (CATHFLO ACTIVASE) 2 MG injection 2 mg by Intracatheter route once as needed for open catheter. 1 each 3   amLODipine (NORVASC) 10 MG tablet Take 1 tablet (10 mg total) by mouth daily. 30 tablet 5   atorvastatin (LIPITOR) 20 MG tablet Take 1 tablet (20 mg total)  by mouth daily. 30 tablet 5   calcitRIOL (ROCALTROL) 0.5 MCG capsule Take 1 capsule (0.5 mcg total)  by mouth daily. 30 capsule 5   carvedilol (COREG) 12.5 MG tablet Take 1 tablet (12.5 mg total) by mouth 2 (two) times daily. 60 tablet 5   cyclobenzaprine (FLEXERIL) 10 MG tablet Take 1 tablet (10 mg total) by mouth 3 (three) times daily. 90 tablet 5   dicyclomine (BENTYL) 10 MG capsule Take 1 capsule (10 mg total) by mouth 3 (three) times daily before meals. 90 capsule 5   DULoxetine (CYMBALTA) 30 MG capsule Take 1 capsule (30 mg total) by mouth daily. 30 capsule 5   epoetin alfa (EPOGEN) 10000 UNIT/ML injection Inject 1 mL (10,000 Units total) into the vein every Monday, Wednesday, and Friday with hemodialysis. 1 mL 3   escitalopram (LEXAPRO) 20 MG tablet Take 1 tablet (20 mg total) by mouth daily. 30 tablet 5   ezetimibe (ZETIA) 10 MG tablet Take 1 tablet (10 mg total) by mouth daily. 30 tablet 5   ferrous sulfate 325 (65 FE) MG tablet Take 1 tablet (325 mg total) by mouth every other day. 30 tablet 5   Fluticasone-Umeclidin-Vilant (TRELEGY ELLIPTA) 100-62.5-25 MCG/ACT AEPB INHALE 1 PUFF INTO THE LUNGS DAILY 60 each 0   folic acid (FOLVITE) 1 MG tablet Take 1 tablet (1 mg total) by mouth daily. 30 tablet 5   HYDROcodone-acetaminophen (NORCO) 5-325 MG tablet Take 1 tablet by mouth every 6 (six) hours as needed for moderate pain (pain score 4-6). 10 tablet 0   ibuprofen (ADVIL) 200 MG tablet Take 400 mg by mouth 3 (three) times daily.     lamoTRIgine (LAMICTAL) 100 MG tablet Take 1 tablet (100 mg total) by mouth daily. 30 tablet 5   loperamide (IMODIUM) 2 MG capsule Take 1 capsule (2 mg total) by mouth 2 (two) times daily. 30 capsule 5   melatonin 5 MG TABS Take 1 tablet (5 mg total) by mouth at bedtime. 30 tablet 0   metolazone (ZAROXOLYN) 2.5 MG tablet Take 2.5 mg by mouth every Monday, Wednesday, and Friday.     mirtazapine (REMERON) 15 MG tablet Take 15 mg by mouth daily.     omeprazole  (PRILOSEC) 40 MG capsule Take 1 capsule (40 mg total) by mouth every morning. 30 capsule 5   oxyCODONE (ROXICODONE) 5 MG immediate release tablet Take 1 tablet (5 mg total) by mouth every 6 (six) hours as needed. 20 tablet 0   potassium chloride SA (KLOR-CON M) 20 MEQ tablet Take 20 mEq by mouth See admin instructions. Take 1 tablet by mouth for 3 days     prazosin (MINIPRESS) 2 MG capsule Take 1 capsule (2 mg total) by mouth at bedtime. 30 capsule 5   QUEtiapine (SEROQUEL) 100 MG tablet Take 1 tablet (100 mg total) by mouth daily. 30 tablet 3   QUEtiapine (SEROQUEL) 200 MG tablet Take 1 tablet (200 mg total) by mouth at bedtime. 30 tablet 3   torsemide (DEMADEX) 20 MG tablet Take 1 tablet (20 mg total) by mouth 2 (two) times daily. 60 tablet 2    ALLERGIES:   Allergies  Allergen Reactions   Asa [Aspirin] Other (See Comments)    GI bleeds   Hydrocodone Shortness Of Breath   Latuda [Lurasidone Hcl] Anaphylaxis   Magnesium-Containing Compounds Anaphylaxis and Other (See Comments)    Tolerated Ensure   Prednisone Anaphylaxis, Swelling and Other (See Comments)    Swelling of tongue, lips, throat   Ultram [Tramadol] Anaphylaxis, Swelling and Rash   Codeine Nausea And Vomiting and Rash   Desyrel [Trazodone] Other (  See Comments)    Paranoia    Hydralazine Other (See Comments)    Muscle spasms   Peanut (Diagnostic) Hives and Other (See Comments)    Raw peanuts only  OK to eat peanut butter.   Topamax [Topiramate] Other (See Comments)    Paranoia   Sulfa Antibiotics Itching   Tape Rash    FAM HX: Family History  Problem Relation Age of Onset   Cancer Mother        lung   Depression Mother    Cancer Father        prostate   Depression Sister    Anxiety disorder Sister    Schizophrenia Sister    Bipolar disorder Sister    Depression Sister    Depression Brother    Heart failure Other        cousin    Social History:   reports that she quit smoking about 6 years ago. Her  smoking use included cigarettes. She started smoking about 45 years ago. She has a 58.6 pack-year smoking history. She has been exposed to tobacco smoke. She has never used smokeless tobacco. She reports that she does not currently use drugs after having used the following drugs: Cocaine. She reports that she does not drink alcohol.  ROS: 12 system relevant ROS neg except per HPI   Blood pressure 117/78, pulse 70, SpO2 98%. PHYSICAL EXAM: Gen: calm in bed  Eyes: anicteric, EOMI ENT: edentuolous, class 4 airway Neck: short CV:  RRR Back: lungs clear Extr:  LUE AVG thrombosed with 2 small bruises, L radial pulse 2+, L ulnar present on audible doppler, 1+ LE edema Neuro: AOx3, a few dystonic movements    No results found. However, due to the size of the patient record, not all encounters were searched. Please check Results Review for a complete set of results.  VAS US DUPLEX DIALYSIS ACCESS (AVF,AVG) Result Date: 01/30/2023 DIALYSIS ACCESS Patient Name:  Tiffany [name] [facility]  Date of Exam:   01/30/2023 Medical Rec #: [id number]        Accession #:    [id number] Date of Birth: December 30, 1960        Patient Gender: F Patient Age:   26 years Exam Location:  [facility] Procedure:      VAS US DUPLEX DIALYSIS ACCESS (AVF, AVG) Referring Phys: Sherald Hess --------------------------------------------------------------------------------  Reason for Exam: No palpable thrill for AVF/AVG. Access Site: Left Upper Extremity. Access Type: Left upper arm AVG 12/21/2022. Performing Technologist: Dorthula Matas RVS, RCS  Examination Guidelines: A complete evaluation includes B-mode imaging, spectral Doppler, color Doppler, and power Doppler as needed of all accessible portions of each vessel. Unilateral testing is considered an integral part of a complete examination. Limited examinations for reoccurring indications may be performed as noted.  Findings:    +--------------------+----------+-----------------+--------+ AVG                 PSV (cm/s)Flow Vol (mL/min)Describe +--------------------+----------+-----------------+--------+ Arterial anastomosis    51                              +--------------------+----------+-----------------+--------+ Prox graft              0                               +--------------------+----------+-----------------+--------+ Mid graft  0                               +--------------------+----------+-----------------+--------+ Distal graft            0                               +--------------------+----------+-----------------+--------+ Venous anastomosis      0                               +--------------------+----------+-----------------+--------+  Summary: Arteriovenous graft-Thrombus noted. No color or Doppler flow noted within the left upper arm AVG. *See table(s) above for measurements and observations.  Diagnosing physician: Sherald Hess MD Electronically signed by Sherald Hess MD on 01/30/2023 at 4:31:14 PM.    --------------------------------------------------------------------------------   Final     Assessment/Plan Tiffany [name] is an 63 y.o. female with multiple medical problems as listed above  including CHF, COPD, chronic pain, h/o drug use, falls, DM, h/o CVA and fairly recent ESRD who presents with thrombosed AVG.  **ESRD with dialysis access dysfunction:  AVG placed 12/21/22 has thrombosed - discussed risk/benefit/alternative to attempt at percutaneous thrombectomy today and she wishes to proceed.  I have quoted her a 50/50 change at success and she is amenable to proceed.  Plan thrombectomy with IV heparin, conscious sedation.    Tyler Pita 02/01/2023, 10:02 AM

## 2023-02-03 IMAGING — DX DG ABDOMEN 2V
3 series · 3 of 3 positions shown · non-contrast
Comparison: February 18, 2020.

CLINICAL DATA: Abdominal pain.

EXAM:
ABDOMEN - 2 VIEW

[abdomen erect]
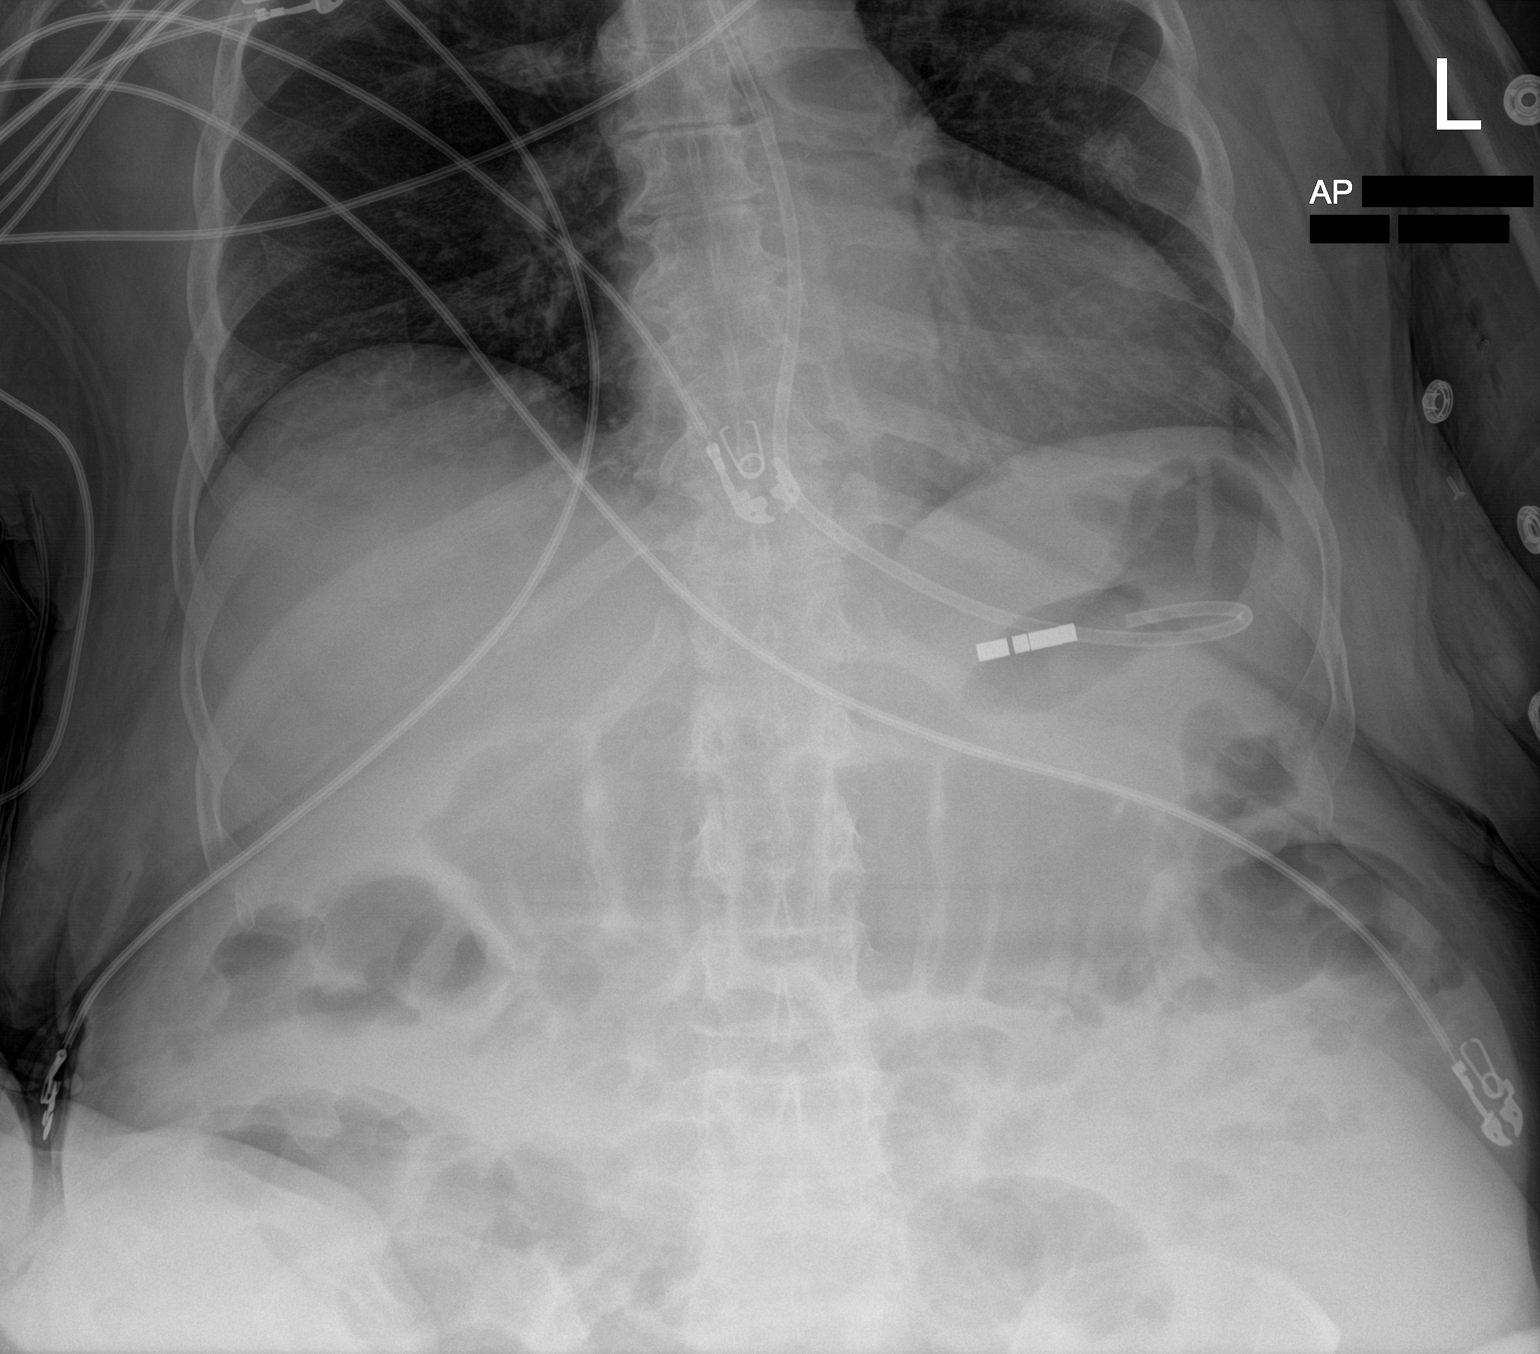

[abdomen supine (1 of 2)]
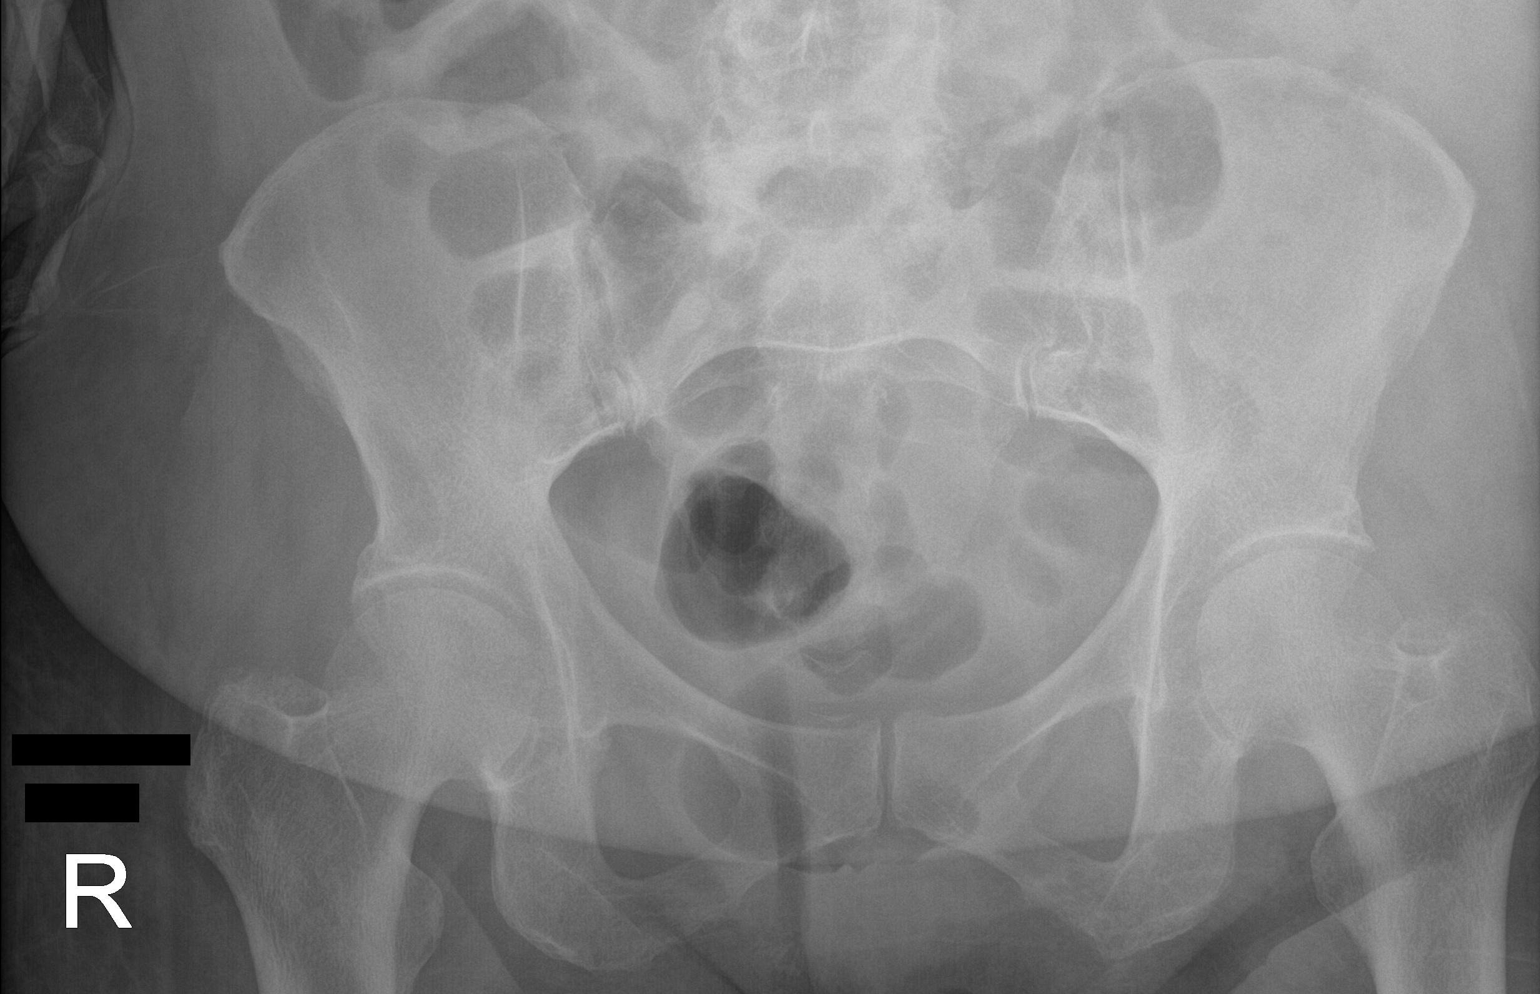

[abdomen supine (2 of 2)]
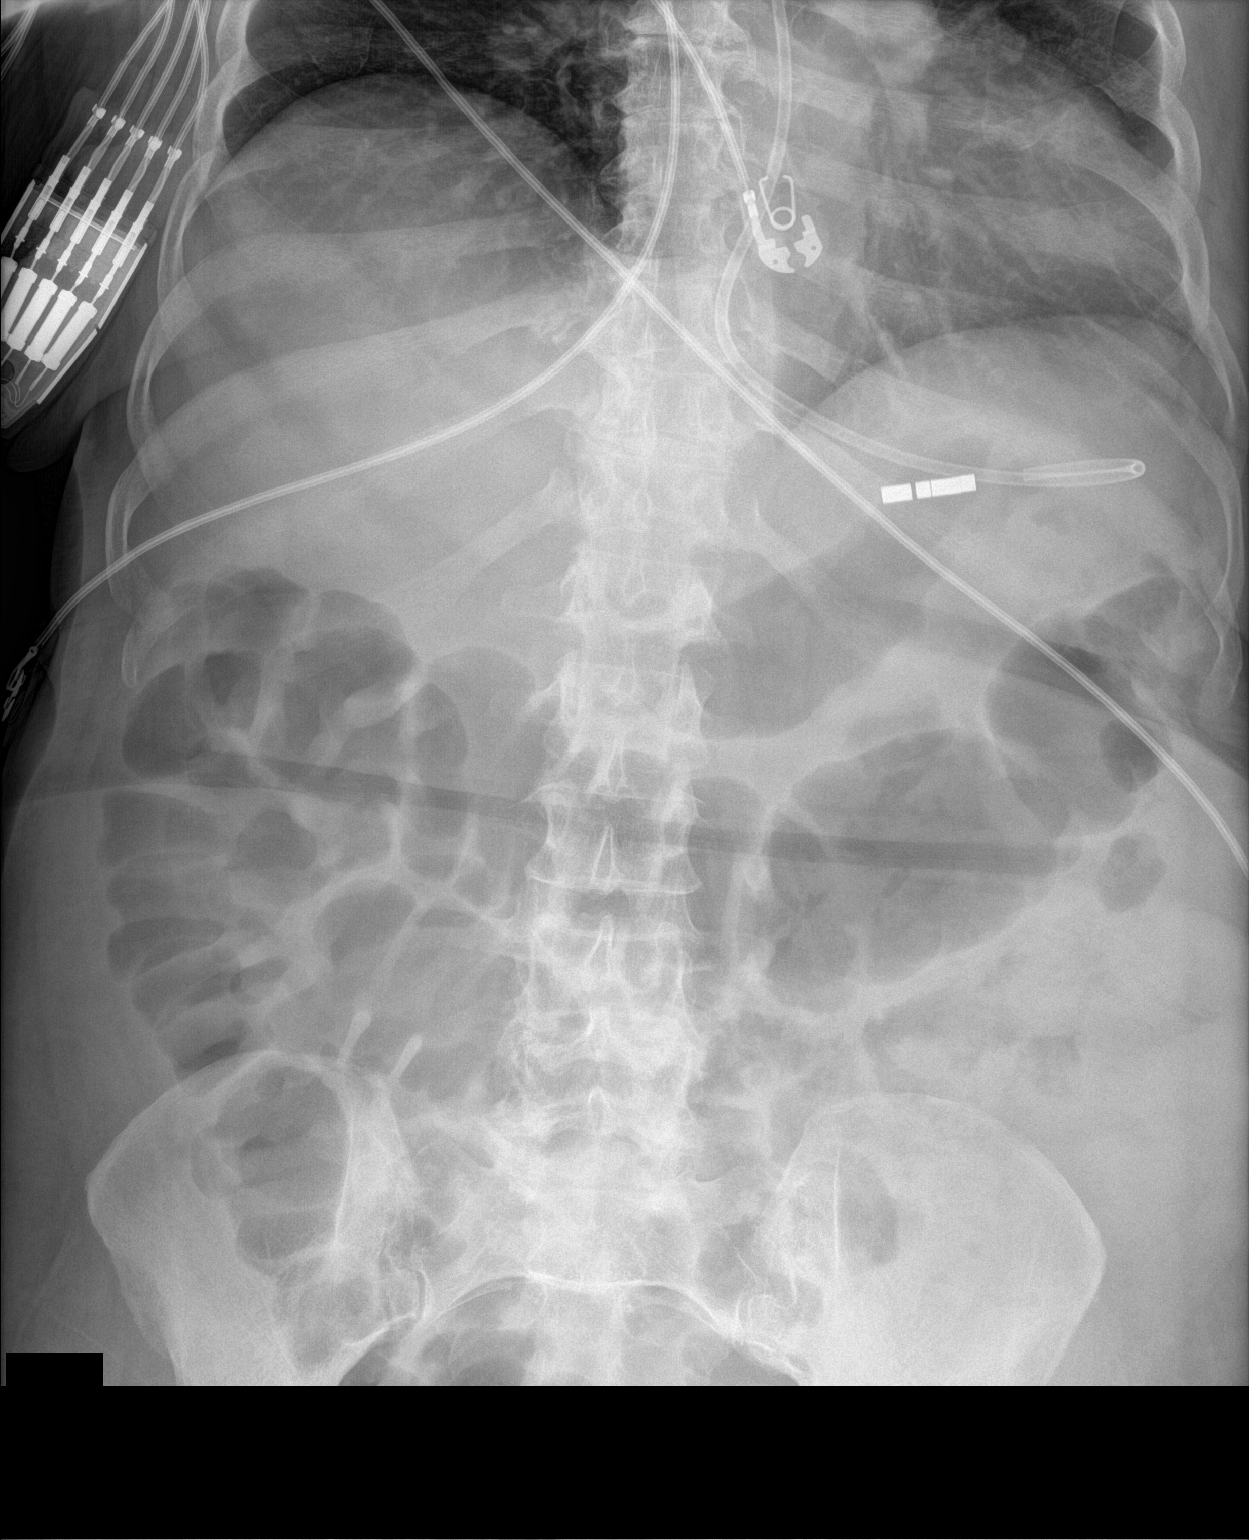

[3 of 3 positions shown; findings below may reference images not displayed]

FINDINGS: The bowel gas pattern is normal. There is no evidence of free air.
Distal tip of feeding tube is seen in proximal stomach. No
radio-opaque calculi or other significant radiographic abnormality
is seen.
IMPRESSION: Distal tip of feeding tube seen in proximal stomach.

## 2023-02-06 ENCOUNTER — Other Ambulatory Visit: Payer: Self-pay

## 2023-02-06 ENCOUNTER — Other Ambulatory Visit (HOSPITAL_BASED_OUTPATIENT_CLINIC_OR_DEPARTMENT_OTHER): Payer: Self-pay

## 2023-02-06 ENCOUNTER — Other Ambulatory Visit (HOSPITAL_COMMUNITY): Payer: Self-pay

## 2023-02-06 MED ORDER — QUETIAPINE FUMARATE 100 MG PO TABS
ORAL_TABLET | ORAL | 2 refills | Status: DC
  Filled 2023-02-06 – 2023-02-07 (×4): qty 90, 30d supply, fill #0
  Filled 2023-03-08: qty 90, 30d supply, fill #1

## 2023-02-06 MED ORDER — CYCLOBENZAPRINE HCL 10 MG PO TABS
10.0000 mg | ORAL_TABLET | Freq: Three times a day (TID) | ORAL | 2 refills | Status: DC | PRN
  Filled 2023-02-20 – 2023-02-22 (×2): qty 90, 30d supply, fill #0

## 2023-02-06 MED ORDER — PROMETHAZINE HCL 6.25 MG/5ML PO SOLN
ORAL | 1 refills | Status: DC
  Filled 2023-02-06: qty 300, 10d supply, fill #0

## 2023-02-07 ENCOUNTER — Other Ambulatory Visit: Payer: Self-pay

## 2023-02-07 ENCOUNTER — Other Ambulatory Visit (HOSPITAL_COMMUNITY): Payer: Self-pay

## 2023-02-07 DIAGNOSIS — Z87891 Personal history of nicotine dependence: Secondary | ICD-10-CM

## 2023-02-07 DIAGNOSIS — Z122 Encounter for screening for malignant neoplasm of respiratory organs: Secondary | ICD-10-CM

## 2023-02-07 NOTE — Telephone Encounter (Signed)
.  Lung Cancer Screening Narrative/Criteria Questionnaire (Cigarette Smokers Only- No Cigars/Pipes/vapes)   Tiffany [name]   SDMV:02/15/2023 at 11:30 with [name], RN   Oct 11, 1960   LDCT: 02/20/2023 at 1:00pm at GI    63 y.o.   Phone: [phone]  Lung Screening Narrative (confirm age 60-77 yrs Medicare / 50-80 yrs Private pay insurance)   Insurance information:[employer]    Referring Provider:[place]   This screening involves an initial phone call with a team member from our program. It is called a shared decision making visit. The initial meeting is required by  insurance and Medicare to make sure you understand the program. This appointment takes about 15-20 minutes to complete. You will complete the screening scan at your scheduled date/time.  This scan takes about 5-10 minutes to complete. You can eat and drink normally before and after the scan.  Criteria questions for Lung Cancer Screening:   Are you a current or former smoker? Former Age began smoking: 16   If you are a former smoker, what year did you quit smoking? 2019 (within 15 yrs)   To calculate your smoking history, I need an accurate estimate of how many packs of cigarettes you smoked per day and for how many years. (Not just the number of PPD you are now smoking)   Years smoking 40 x Packs per day 1.5 = Pack years 60   (at least 20 pack yrs)   (Make sure they understand that we need to know how much they have smoked in the past, not just the number of PPD they are smoking now)  Do you have a personal history of cancer?  No    Do you have a family history of cancer? Yes  (cancer type and and relative) Aunt unsure of type  Are you coughing up blood?  No  Have you had unexplained weight loss of 15 lbs or more in the last 6 months? No  It looks like you meet all criteria.  When would be a good time for [name] to schedule you for this screening?   Additional information:

## 2023-02-08 ENCOUNTER — Other Ambulatory Visit (HOSPITAL_COMMUNITY): Payer: Self-pay

## 2023-02-09 ENCOUNTER — Encounter (HOSPITAL_COMMUNITY): Payer: Self-pay

## 2023-02-09 ENCOUNTER — Other Ambulatory Visit (HOSPITAL_COMMUNITY): Payer: Self-pay

## 2023-02-10 ENCOUNTER — Other Ambulatory Visit: Payer: Self-pay

## 2023-02-10 ENCOUNTER — Emergency Department (HOSPITAL_COMMUNITY)
Admission: EM | Admit: 2023-02-10 | Discharge: 2023-02-10 | Disposition: A | Discharge status: Home / Self Care | Payer: HMO | Attending: Emergency Medicine | Admitting: Emergency Medicine

## 2023-02-10 DIAGNOSIS — Z20822 Contact with and (suspected) exposure to covid-19: Secondary | ICD-10-CM | POA: Diagnosis not present

## 2023-02-10 DIAGNOSIS — R0602 Shortness of breath: Secondary | ICD-10-CM | POA: Diagnosis present

## 2023-02-10 DIAGNOSIS — J101 Influenza due to other identified influenza virus with other respiratory manifestations: Secondary | ICD-10-CM

## 2023-02-10 DIAGNOSIS — J09X2 Influenza due to identified novel influenza A virus with other respiratory manifestations: Secondary | ICD-10-CM | POA: Diagnosis not present

## 2023-02-10 LAB — RESP PANEL BY RT-PCR (RSV, FLU A&B, COVID)  RVPGX2
Influenza A by PCR: POSITIVE — AB
Influenza B by PCR: NEGATIVE
Resp Syncytial Virus by PCR: NEGATIVE
SARS Coronavirus 2 by RT PCR: NEGATIVE

## 2023-02-10 MED ORDER — BENZONATATE 100 MG PO CAPS
100.0000 mg | ORAL_CAPSULE | Freq: Once | ORAL | Status: AC
  Administered 2023-02-10: 100 mg via ORAL
  Filled 2023-02-10: qty 1

## 2023-02-10 MED ORDER — ACETAMINOPHEN 325 MG PO TABS
650.0000 mg | ORAL_TABLET | Freq: Once | ORAL | Status: AC
  Administered 2023-02-10: 650 mg via ORAL
  Filled 2023-02-10: qty 2

## 2023-02-10 NOTE — ED Notes (Signed)
This RN reviewed discharge instructions with patient. She verbalized understanding and denied any further questions. PT well appearing upon discharge and reports tolerable pain. Pt ambulated with stable gait to exit. Pt endorses ride home.

## 2023-02-10 NOTE — ED Triage Notes (Addendum)
Patient via [facility] from home c/o cough x 2 day. EMS called earlier in the night. Albuterol tx given earlier. Currently lung cta. Reports chest wall pain with cough. Fever 140f.   EKG showed SR VSS  650mg  tylenol given en route

## 2023-02-10 NOTE — Discharge Instructions (Signed)
You have the flu; you may use tylenol for your symptoms. Return to the ER with any new severe symptoms.

## 2023-02-10 NOTE — ED Provider Notes (Signed)
[place] EMERGENCY DEPARTMENT AT [facility] Provider Note   CSN: [id number] Arrival date & time: 02/10/23  6962     History  Chief Complaint  Patient presents with   Shortness of Breath         Tiffany [name] is a 63 y.o. female who presents with concern for 2 days of body aches, cough, congestion, fever, chills. Soreness in the chest wall and ribs with coughing. Hx of COPD, on HD M/W/F, most recent session yesterday.  Extensive medical hx.  HPI     Home Medications Prior to Admission medications   Medication Sig Start Date End Date Taking? Authorizing Provider  albuterol (VENTOLIN HFA) 108 (90 Base) MCG/ACT inhaler INHALE 2 PUFFS INTO THE LUNGS EVERY 6 HOURS AS NEEDED FOR WHEEZING OR SHORTNESS OF BREATH 10/24/22   Tiffany Kocher, DO  alteplase (CATHFLO ACTIVASE) 2 MG injection 2 mg by Intracatheter route once as needed for open catheter. 11/22/22   Sarina Ill, DO  amLODipine (NORVASC) 10 MG tablet Take 1 tablet (10 mg total) by mouth daily. 10/09/22   McDiarmid, Leighton Roach, MD  atorvastatin (LIPITOR) 20 MG tablet Take 1 tablet (20 mg total) by mouth daily. 10/09/22   McDiarmid, Leighton Roach, MD  calcitRIOL (ROCALTROL) 0.5 MCG capsule Take 1 capsule (0.5 mcg total) by mouth daily. 10/09/22   McDiarmid, Leighton Roach, MD  carvedilol (COREG) 12.5 MG tablet Take 1 tablet (12.5 mg total) by mouth 2 (two) times daily. 10/09/22   McDiarmid, Leighton Roach, MD  cyclobenzaprine (FLEXERIL) 10 MG tablet Take 1 tablet (10 mg total) by mouth 3 (three) times daily. 10/09/22   McDiarmid, Leighton Roach, MD  cyclobenzaprine (FLEXERIL) 10 MG tablet take 1 tablet by mouth three times daily as needed 02/06/23     dicyclomine (BENTYL) 10 MG capsule Take 1 capsule (10 mg total) by mouth 3 (three) times daily before meals. 10/09/22   McDiarmid, Leighton Roach, MD  DULoxetine (CYMBALTA) 30 MG capsule Take 1 capsule (30 mg total) by mouth daily. 10/09/22   McDiarmid, Leighton Roach, MD  epoetin alfa (EPOGEN) 10000 UNIT/ML injection  Inject 1 mL (10,000 Units total) into the vein every Monday, Wednesday, and Friday with hemodialysis. 11/22/22   Sarina Ill, DO  escitalopram (LEXAPRO) 20 MG tablet Take 1 tablet (20 mg total) by mouth daily. 10/09/22   McDiarmid, Leighton Roach, MD  ezetimibe (ZETIA) 10 MG tablet Take 1 tablet (10 mg total) by mouth daily. 10/09/22   McDiarmid, Leighton Roach, MD  ferrous sulfate 325 (65 FE) MG tablet Take 1 tablet (325 mg total) by mouth every other day. 10/09/22 10/04/23  McDiarmid, Leighton Roach, MD  Fluticasone-Umeclidin-Vilant (TRELEGY ELLIPTA) 100-62.5-25 MCG/ACT AEPB INHALE 1 PUFF INTO THE LUNGS DAILY 03/20/22   Tiffany Kocher, DO  folic acid (FOLVITE) 1 MG tablet Take 1 tablet (1 mg total) by mouth daily. 10/09/22   McDiarmid, Leighton Roach, MD  HYDROcodone-acetaminophen (NORCO) 5-325 MG tablet Take 1 tablet by mouth every 6 (six) hours as needed for moderate pain (pain score 4-6). 01/03/23   Lars Mage, PA-C  ibuprofen (ADVIL) 200 MG tablet Take 400 mg by mouth 3 (three) times daily.    [provider]  lamoTRIgine (LAMICTAL) 100 MG tablet Take 1 tablet (100 mg total) by mouth daily. 10/09/22   McDiarmid, Leighton Roach, MD  loperamide (IMODIUM) 2 MG capsule Take 1 capsule (2 mg total) by mouth 2 (two) times daily. 10/09/22   McDiarmid, Leighton Roach, MD  melatonin  5 MG TABS Take 1 tablet (5 mg total) by mouth at bedtime. 11/22/22   Sarina Ill, DO  metolazone (ZAROXOLYN) 2.5 MG tablet Take 2.5 mg by mouth every Monday, Wednesday, and Friday. 11/16/22   [provider]  mirtazapine (REMERON) 15 MG tablet Take 15 mg by mouth daily. 12/07/22   [provider]  omeprazole (PRILOSEC) 40 MG capsule Take 1 capsule (40 mg total) by mouth every morning. 10/09/22   McDiarmid, Leighton Roach, MD  oxyCODONE (ROXICODONE) 5 MG immediate release tablet Take 1 tablet (5 mg total) by mouth every 6 (six) hours as needed. 12/21/22 12/21/23  Emilie Rutter, PA-C  potassium chloride SA (KLOR-CON M) 20 MEQ tablet Take  20 mEq by mouth See admin instructions. Take 1 tablet by mouth for 3 days    [provider]  prazosin (MINIPRESS) 2 MG capsule Take 1 capsule (2 mg total) by mouth at bedtime. 10/09/22   McDiarmid, Leighton Roach, MD  promethazine (PHENERGAN) 6.25 MG/5ML solution Take 5 ml by mouth every 4-6 hours as needed for cough and nausea 02/06/23     QUEtiapine (SEROQUEL) 100 MG tablet Take 1 tablet (100 mg total) by mouth daily. 11/22/22   Sarina Ill, DO  QUEtiapine (SEROQUEL) 100 MG tablet Take 1 tablet (100 mg total) by mouth in the morning AND 2 tablets (200 mg total) every evening. 02/06/23   [name], MD  QUEtiapine (SEROQUEL) 200 MG tablet Take 1 tablet (200 mg total) by mouth at bedtime. 11/22/22   Sarina Ill, DO  torsemide (DEMADEX) 20 MG tablet Take 1 tablet (20 mg total) by mouth 2 (two) times daily. 11/09/22         Allergies    Asa [aspirin], Hydrocodone, Latuda [lurasidone hcl], Magnesium-containing compounds, Prednisone, Ultram [tramadol], Codeine, Desyrel [trazodone], Hydralazine, Peanut (diagnostic), Topamax [topiramate], Sulfa antibiotics, and Tape    Review of Systems   Review of Systems  Constitutional:  Positive for activity change, appetite change, chills, fatigue and fever.  HENT:  Positive for congestion.   Respiratory:  Positive for cough.   Cardiovascular: Negative.   Gastrointestinal: Negative.   Musculoskeletal:  Positive for myalgias.  Skin: Negative.     Physical Exam Updated Vital Signs BP (!) 144/83   Pulse 80   Temp 98.7 F (37.1 C)   Resp 16   Ht 5\' 3"  (1.6 m)   Wt 88.5 kg   SpO2 100%   BMI 34.56 kg/m  Physical Exam Vitals and nursing note reviewed.  Constitutional:      Appearance: [name] obese. [name] not ill-appearing or toxic-appearing.  HENT:     Head: Normocephalic and atraumatic.     Mouth/Throat:     Mouth: Mucous membranes are moist.     Pharynx: No oropharyngeal exudate or posterior oropharyngeal erythema.  Eyes:      General:        Right eye: No discharge.        Left eye: No discharge.     Conjunctiva/sclera: Conjunctivae normal.  Cardiovascular:     Rate and Rhythm: Normal rate and regular rhythm.     Pulses: Normal pulses.  Pulmonary:     Effort: Pulmonary effort is normal. No respiratory distress.     Breath sounds: Normal breath sounds. No wheezing or rales.  Chest:     Chest wall: No mass, tenderness or edema.  Abdominal:     General: Bowel sounds are normal. There is no distension.  Palpations: Abdomen is soft.     Tenderness: There is no abdominal tenderness.  Musculoskeletal:        General: No deformity.     Cervical back: Neck supple.     Right lower leg: No edema.     Left lower leg: No edema.  Skin:    General: Skin is warm and dry.     Capillary Refill: Capillary refill takes less than 2 seconds.  Neurological:     General: No focal deficit present.     Mental Status: [name] alert and oriented to person, place, and time. Mental status is at baseline.  Psychiatric:        Mood and Affect: Mood normal.     ED Results / Procedures / Treatments   Labs (all labs ordered are listed, but only abnormal results are displayed) Labs Reviewed  RESP PANEL BY RT-PCR (RSV, FLU A&B, COVID)  [id number] - Abnormal; Notable for the following components:      Result Value   Influenza A by PCR POSITIVE (*)    All other components within normal limits    EKG None  Radiology No results found.  Procedures Procedures    Medications Ordered in ED Medications  benzonatate (TESSALON) capsule 100 mg (100 mg Oral Given 02/10/23 0529)  acetaminophen (TYLENOL) tablet 650 mg (650 mg Oral Given 02/10/23 0530)    ED Course/ Medical Decision Making/ A&P                                 Medical Decision Making 63 year old female with viral syndrome. Hypertensive on intake vitals otherwise normal.  Pulmonary send unremarkable abdominal exam.  Patient well-appearing, tolerating p.o.   Tylenol and Tessalon offered.  Amount and/or Complexity of Data Reviewed Labs:     Details: RVP positive with influenza A.  Risk OTC drugs. Prescription drug management.   Clinical picture most with flu A, viral syndrome.  Patient declined any laboratory studies, feel this is appropriate given well appearance normal hemodynamics and clear etiology for patient's symptomatology.  Recommend supportive care close outpatient follow-up and compliance with her HD.  Clinical concern for emergent underlying condition of or further ED workup and patient management is exceedingly low.  Natalea  voiced understanding of her medical evaluation and treatment plan. Each of their questions answered to their expressed satisfaction.  Return precautions were given.  Patient is well-appearing, stable, and was discharged in good condition.  This chart was dictated using voice recognition software, Dragon. Despite the best efforts of this provider to proofread and correct errors, errors may still occur which can change documentation meaning.         Final Clinical Impression(s) / ED Diagnoses Final diagnoses:  Influenza A    Rx / DC Orders ED Discharge Orders     None         Sherrilee Gilles 02/10/23 0454    Dione Booze, MD 02/10/23 [phone]

## 2023-02-13 ENCOUNTER — Other Ambulatory Visit: Payer: Self-pay

## 2023-02-13 ENCOUNTER — Other Ambulatory Visit (HOSPITAL_COMMUNITY): Payer: Self-pay

## 2023-02-13 ENCOUNTER — Other Ambulatory Visit: Payer: Self-pay | Admitting: Family Medicine

## 2023-02-13 ENCOUNTER — Inpatient Hospital Stay (HOSPITAL_COMMUNITY)
Admission: EM | Admit: 2023-02-13 | Discharge: 2023-02-20 | DRG: 193 | Disposition: A | Discharge status: Home / Self Care | Payer: HMO | Attending: Internal Medicine | Admitting: Internal Medicine

## 2023-02-13 DIAGNOSIS — E876 Hypokalemia: Secondary | ICD-10-CM | POA: Diagnosis present

## 2023-02-13 DIAGNOSIS — I252 Old myocardial infarction: Secondary | ICD-10-CM

## 2023-02-13 DIAGNOSIS — Z8674 Personal history of sudden cardiac arrest: Secondary | ICD-10-CM

## 2023-02-13 DIAGNOSIS — Z1152 Encounter for screening for COVID-19: Secondary | ICD-10-CM

## 2023-02-13 DIAGNOSIS — J189 Pneumonia, unspecified organism: Secondary | ICD-10-CM | POA: Diagnosis present

## 2023-02-13 DIAGNOSIS — Z91048 Other nonmedicinal substance allergy status: Secondary | ICD-10-CM

## 2023-02-13 DIAGNOSIS — Z882 Allergy status to sulfonamides status: Secondary | ICD-10-CM

## 2023-02-13 DIAGNOSIS — D849 Immunodeficiency, unspecified: Secondary | ICD-10-CM | POA: Diagnosis present

## 2023-02-13 DIAGNOSIS — Z90710 Acquired absence of both cervix and uterus: Secondary | ICD-10-CM

## 2023-02-13 DIAGNOSIS — R296 Repeated falls: Secondary | ICD-10-CM | POA: Diagnosis present

## 2023-02-13 DIAGNOSIS — I132 Hypertensive heart and chronic kidney disease with heart failure and with stage 5 chronic kidney disease, or end stage renal disease: Secondary | ICD-10-CM | POA: Diagnosis present

## 2023-02-13 DIAGNOSIS — J45901 Unspecified asthma with (acute) exacerbation: Secondary | ICD-10-CM | POA: Diagnosis present

## 2023-02-13 DIAGNOSIS — E1122 Type 2 diabetes mellitus with diabetic chronic kidney disease: Secondary | ICD-10-CM | POA: Diagnosis present

## 2023-02-13 DIAGNOSIS — F250 Schizoaffective disorder, bipolar type: Secondary | ICD-10-CM | POA: Diagnosis present

## 2023-02-13 DIAGNOSIS — Z8659 Personal history of other mental and behavioral disorders: Secondary | ICD-10-CM

## 2023-02-13 DIAGNOSIS — N179 Acute kidney failure, unspecified: Secondary | ICD-10-CM | POA: Diagnosis present

## 2023-02-13 DIAGNOSIS — K219 Gastro-esophageal reflux disease without esophagitis: Secondary | ICD-10-CM | POA: Diagnosis present

## 2023-02-13 DIAGNOSIS — Z79899 Other long term (current) drug therapy: Secondary | ICD-10-CM

## 2023-02-13 DIAGNOSIS — F1421 Cocaine dependence, in remission: Secondary | ICD-10-CM | POA: Diagnosis present

## 2023-02-13 DIAGNOSIS — Z91010 Allergy to peanuts: Secondary | ICD-10-CM

## 2023-02-13 DIAGNOSIS — Z87891 Personal history of nicotine dependence: Secondary | ICD-10-CM

## 2023-02-13 DIAGNOSIS — F209 Schizophrenia, unspecified: Secondary | ICD-10-CM | POA: Diagnosis present

## 2023-02-13 DIAGNOSIS — I5032 Chronic diastolic (congestive) heart failure: Secondary | ICD-10-CM | POA: Diagnosis present

## 2023-02-13 DIAGNOSIS — Z8249 Family history of ischemic heart disease and other diseases of the circulatory system: Secondary | ICD-10-CM

## 2023-02-13 DIAGNOSIS — R079 Chest pain, unspecified: Secondary | ICD-10-CM | POA: Diagnosis present

## 2023-02-13 DIAGNOSIS — J101 Influenza due to other identified influenza virus with other respiratory manifestations: Secondary | ICD-10-CM | POA: Diagnosis present

## 2023-02-13 DIAGNOSIS — J4489 Other specified chronic obstructive pulmonary disease: Secondary | ICD-10-CM | POA: Diagnosis present

## 2023-02-13 DIAGNOSIS — G894 Chronic pain syndrome: Secondary | ICD-10-CM | POA: Diagnosis present

## 2023-02-13 DIAGNOSIS — Z8673 Personal history of transient ischemic attack (TIA), and cerebral infarction without residual deficits: Secondary | ICD-10-CM

## 2023-02-13 DIAGNOSIS — Z604 Social exclusion and rejection: Secondary | ICD-10-CM | POA: Diagnosis present

## 2023-02-13 DIAGNOSIS — E66811 Obesity, class 1: Secondary | ICD-10-CM | POA: Diagnosis present

## 2023-02-13 DIAGNOSIS — J9612 Chronic respiratory failure with hypercapnia: Secondary | ICD-10-CM | POA: Diagnosis present

## 2023-02-13 DIAGNOSIS — G931 Anoxic brain damage, not elsewhere classified: Secondary | ICD-10-CM | POA: Diagnosis present

## 2023-02-13 DIAGNOSIS — E669 Obesity, unspecified: Secondary | ICD-10-CM | POA: Diagnosis present

## 2023-02-13 DIAGNOSIS — J439 Emphysema, unspecified: Secondary | ICD-10-CM | POA: Diagnosis present

## 2023-02-13 DIAGNOSIS — G8220 Paraplegia, unspecified: Secondary | ICD-10-CM | POA: Diagnosis present

## 2023-02-13 DIAGNOSIS — N2581 Secondary hyperparathyroidism of renal origin: Secondary | ICD-10-CM | POA: Diagnosis present

## 2023-02-13 DIAGNOSIS — N184 Chronic kidney disease, stage 4 (severe): Secondary | ICD-10-CM | POA: Diagnosis present

## 2023-02-13 DIAGNOSIS — D631 Anemia in chronic kidney disease: Secondary | ICD-10-CM | POA: Diagnosis present

## 2023-02-13 DIAGNOSIS — J111 Influenza due to unidentified influenza virus with other respiratory manifestations: Principal | ICD-10-CM

## 2023-02-13 DIAGNOSIS — Z992 Dependence on renal dialysis: Secondary | ICD-10-CM

## 2023-02-13 DIAGNOSIS — Z818 Family history of other mental and behavioral disorders: Secondary | ICD-10-CM

## 2023-02-13 DIAGNOSIS — G4733 Obstructive sleep apnea (adult) (pediatric): Secondary | ICD-10-CM | POA: Diagnosis present

## 2023-02-13 DIAGNOSIS — J9601 Acute respiratory failure with hypoxia: Secondary | ICD-10-CM

## 2023-02-13 DIAGNOSIS — E785 Hyperlipidemia, unspecified: Secondary | ICD-10-CM | POA: Diagnosis present

## 2023-02-13 DIAGNOSIS — F4312 Post-traumatic stress disorder, chronic: Secondary | ICD-10-CM | POA: Diagnosis present

## 2023-02-13 DIAGNOSIS — Z888 Allergy status to other drugs, medicaments and biological substances status: Secondary | ICD-10-CM

## 2023-02-13 DIAGNOSIS — J1001 Influenza due to other identified influenza virus with the same other identified influenza virus pneumonia: Secondary | ICD-10-CM | POA: Diagnosis not present

## 2023-02-13 DIAGNOSIS — E1140 Type 2 diabetes mellitus with diabetic neuropathy, unspecified: Secondary | ICD-10-CM | POA: Diagnosis present

## 2023-02-13 DIAGNOSIS — N186 End stage renal disease: Secondary | ICD-10-CM | POA: Diagnosis present

## 2023-02-13 DIAGNOSIS — F1911 Other psychoactive substance abuse, in remission: Secondary | ICD-10-CM | POA: Diagnosis present

## 2023-02-13 DIAGNOSIS — Z6834 Body mass index (BMI) 34.0-34.9, adult: Secondary | ICD-10-CM

## 2023-02-13 DIAGNOSIS — F200 Paranoid schizophrenia: Secondary | ICD-10-CM | POA: Diagnosis present

## 2023-02-13 DIAGNOSIS — Z885 Allergy status to narcotic agent status: Secondary | ICD-10-CM

## 2023-02-13 DIAGNOSIS — J9621 Acute and chronic respiratory failure with hypoxia: Secondary | ICD-10-CM | POA: Diagnosis present

## 2023-02-13 DIAGNOSIS — Z886 Allergy status to analgesic agent status: Secondary | ICD-10-CM

## 2023-02-13 DIAGNOSIS — Z993 Dependence on wheelchair: Secondary | ICD-10-CM

## 2023-02-13 DIAGNOSIS — S29011A Strain of muscle and tendon of front wall of thorax, initial encounter: Secondary | ICD-10-CM | POA: Diagnosis present

## 2023-02-13 DIAGNOSIS — M940 Chondrocostal junction syndrome [Tietze]: Secondary | ICD-10-CM | POA: Diagnosis present

## 2023-02-13 DIAGNOSIS — J440 Chronic obstructive pulmonary disease with acute lower respiratory infection: Secondary | ICD-10-CM | POA: Diagnosis present

## 2023-02-13 LAB — BASIC METABOLIC PANEL
Anion gap: 16 — ABNORMAL HIGH (ref 5–15)
BUN: 35 mg/dL — ABNORMAL HIGH (ref 8–23)
CO2: 19 mmol/L — ABNORMAL LOW (ref 22–32)
Calcium: 8.7 mg/dL — ABNORMAL LOW (ref 8.9–10.3)
Chloride: 95 mmol/L — ABNORMAL LOW (ref 98–111)
Creatinine, Ser: 3.84 mg/dL — ABNORMAL HIGH (ref 0.44–1.00)
GFR, Estimated: 13 mL/min — ABNORMAL LOW (ref >60)
Glucose, Bld: 83 mg/dL (ref 70–99)
Potassium: 2.9 mmol/L — ABNORMAL LOW (ref 3.5–5.1)
Sodium: 130 mmol/L — ABNORMAL LOW (ref 135–145)

## 2023-02-13 LAB — CBC WITH DIFFERENTIAL/PLATELET
Abs Immature Granulocytes: 0.06 10*3/uL (ref 0.00–0.07)
Basophils Absolute: 0 10*3/uL (ref 0.0–0.1)
Basophils Relative: 0 %
Eosinophils Absolute: 0.2 10*3/uL (ref 0.0–0.5)
Eosinophils Relative: 2 %
HCT: 31.1 % — ABNORMAL LOW (ref 36.0–46.0)
Hemoglobin: 9.9 g/dL — ABNORMAL LOW (ref 12.0–15.0)
Immature Granulocytes: 1 %
Lymphocytes Relative: 17 %
Lymphs Abs: 1.5 10*3/uL (ref 0.7–4.0)
MCH: 29.1 pg (ref 26.0–34.0)
MCHC: 31.8 g/dL (ref 30.0–36.0)
MCV: 91.5 fL (ref 80.0–100.0)
Monocytes Absolute: 0.8 10*3/uL (ref 0.1–1.0)
Monocytes Relative: 9 %
Neutro Abs: 6.2 10*3/uL (ref 1.7–7.7)
Neutrophils Relative %: 71 %
Platelets: 340 10*3/uL (ref 150–400)
RBC: 3.4 MIL/uL — ABNORMAL LOW (ref 3.87–5.11)
RDW: 15.2 % (ref 11.5–15.5)
WBC: 8.7 10*3/uL (ref 4.0–10.5)
nRBC: 0 % (ref 0.0–0.2)

## 2023-02-13 LAB — TROPONIN I (HIGH SENSITIVITY): Troponin I (High Sensitivity): 20 ng/L — ABNORMAL HIGH (ref <18)

## 2023-02-13 MED ORDER — IPRATROPIUM-ALBUTEROL 0.5-2.5 (3) MG/3ML IN SOLN
3.0000 mL | Freq: Once | RESPIRATORY_TRACT | Status: AC
  Administered 2023-02-13: 3 mL via RESPIRATORY_TRACT
  Filled 2023-02-13: qty 3

## 2023-02-13 MED ORDER — TORSEMIDE 20 MG PO TABS: 20.0000 mg | ORAL_TABLET | Freq: Two times a day (BID) | ORAL | 2 refills | Status: DC

## 2023-02-13 MED ORDER — ACETAMINOPHEN 325 MG PO TABS
650.0000 mg | ORAL_TABLET | Freq: Once | ORAL | Status: AC
  Administered 2023-02-13: 650 mg via ORAL
  Filled 2023-02-13: qty 2

## 2023-02-13 NOTE — ED Triage Notes (Signed)
BIB [facility] from home, 2 day with flu like symptoms dx with Flu 2 days ago. Cough is causing chest pain.   EMS  324 asa CBG 90 HR 66 BP 129/73 Temp 98.3 96 on 3l/[place]

## 2023-02-13 NOTE — ED Provider Triage Note (Signed)
Emergency Medicine Provider Triage Evaluation Note  ABIGALE DOROW , a 63 y.o. female  was evaluated in triage.  Pt complains of sob. Patient admits to dx of flu two days ago. Hx of asthma and copd. Worsening of sob with wheezing and cough. Also admits to left sided chest pain that is worse without movement, coughing, and deep breathing. States is started after coughing for several days.  Review of Systems  Positive: Chest pain, sob, wheezing Negative: N/a  Physical Exam  BP 110/80 (BP Location: Right Arm)   Pulse 70   Temp 98.7 F (37.1 C) (Oral)   Resp 16   Ht 5\' 3"  (1.6 m)   Wt 88.5 kg   SpO2 100%   BMI 34.56 kg/m  Gen:   Awake, no distress   Resp:  WHEEZING MSK:   Moves extremities without difficulty  Other:  Reproducible left anterior chest wall pain near rib 2-3.  Medical Decision Making  Medically screening exam initiated at 7:42 PM.  Appropriate orders placed.  RAGNA KRAMLICH was informed that the remainder of the evaluation will be completed by another provider, this initial triage assessment does not replace that evaluation, and the importance of remaining in the ED until their evaluation is complete.  Asthma exacerbation from flu plus costochondritis from coughing. Meds ordered.   Franne Forts, DO 02/13/23 1944

## 2023-02-14 ENCOUNTER — Inpatient Hospital Stay (HOSPITAL_COMMUNITY): Payer: HMO

## 2023-02-14 DIAGNOSIS — J189 Pneumonia, unspecified organism: Secondary | ICD-10-CM | POA: Diagnosis present

## 2023-02-14 DIAGNOSIS — G931 Anoxic brain damage, not elsewhere classified: Secondary | ICD-10-CM | POA: Diagnosis present

## 2023-02-14 DIAGNOSIS — Z992 Dependence on renal dialysis: Secondary | ICD-10-CM | POA: Diagnosis not present

## 2023-02-14 DIAGNOSIS — J9621 Acute and chronic respiratory failure with hypoxia: Secondary | ICD-10-CM | POA: Diagnosis present

## 2023-02-14 DIAGNOSIS — Z1152 Encounter for screening for COVID-19: Secondary | ICD-10-CM | POA: Diagnosis not present

## 2023-02-14 DIAGNOSIS — Z6834 Body mass index (BMI) 34.0-34.9, adult: Secondary | ICD-10-CM | POA: Diagnosis not present

## 2023-02-14 DIAGNOSIS — D849 Immunodeficiency, unspecified: Secondary | ICD-10-CM | POA: Diagnosis present

## 2023-02-14 DIAGNOSIS — I132 Hypertensive heart and chronic kidney disease with heart failure and with stage 5 chronic kidney disease, or end stage renal disease: Secondary | ICD-10-CM | POA: Diagnosis present

## 2023-02-14 DIAGNOSIS — E1140 Type 2 diabetes mellitus with diabetic neuropathy, unspecified: Secondary | ICD-10-CM | POA: Diagnosis present

## 2023-02-14 DIAGNOSIS — F250 Schizoaffective disorder, bipolar type: Secondary | ICD-10-CM | POA: Diagnosis present

## 2023-02-14 DIAGNOSIS — N186 End stage renal disease: Secondary | ICD-10-CM | POA: Diagnosis present

## 2023-02-14 DIAGNOSIS — N179 Acute kidney failure, unspecified: Secondary | ICD-10-CM | POA: Diagnosis present

## 2023-02-14 DIAGNOSIS — J1001 Influenza due to other identified influenza virus with the same other identified influenza virus pneumonia: Secondary | ICD-10-CM | POA: Diagnosis present

## 2023-02-14 DIAGNOSIS — J9601 Acute respiratory failure with hypoxia: Secondary | ICD-10-CM | POA: Diagnosis not present

## 2023-02-14 DIAGNOSIS — F1911 Other psychoactive substance abuse, in remission: Secondary | ICD-10-CM | POA: Diagnosis present

## 2023-02-14 DIAGNOSIS — J45901 Unspecified asthma with (acute) exacerbation: Secondary | ICD-10-CM | POA: Diagnosis present

## 2023-02-14 DIAGNOSIS — G8220 Paraplegia, unspecified: Secondary | ICD-10-CM | POA: Diagnosis present

## 2023-02-14 DIAGNOSIS — E1122 Type 2 diabetes mellitus with diabetic chronic kidney disease: Secondary | ICD-10-CM | POA: Diagnosis present

## 2023-02-14 DIAGNOSIS — J9612 Chronic respiratory failure with hypercapnia: Secondary | ICD-10-CM | POA: Diagnosis present

## 2023-02-14 DIAGNOSIS — J101 Influenza due to other identified influenza virus with other respiratory manifestations: Secondary | ICD-10-CM | POA: Diagnosis present

## 2023-02-14 DIAGNOSIS — J4489 Other specified chronic obstructive pulmonary disease: Secondary | ICD-10-CM | POA: Diagnosis present

## 2023-02-14 DIAGNOSIS — J440 Chronic obstructive pulmonary disease with acute lower respiratory infection: Secondary | ICD-10-CM | POA: Diagnosis present

## 2023-02-14 DIAGNOSIS — N2581 Secondary hyperparathyroidism of renal origin: Secondary | ICD-10-CM | POA: Diagnosis present

## 2023-02-14 DIAGNOSIS — D631 Anemia in chronic kidney disease: Secondary | ICD-10-CM | POA: Diagnosis present

## 2023-02-14 DIAGNOSIS — F1421 Cocaine dependence, in remission: Secondary | ICD-10-CM | POA: Diagnosis present

## 2023-02-14 DIAGNOSIS — J439 Emphysema, unspecified: Secondary | ICD-10-CM | POA: Diagnosis present

## 2023-02-14 DIAGNOSIS — I5032 Chronic diastolic (congestive) heart failure: Secondary | ICD-10-CM | POA: Diagnosis present

## 2023-02-14 LAB — CBC
HCT: 31.6 % — ABNORMAL LOW (ref 36.0–46.0)
HCT: 35 % — ABNORMAL LOW (ref 36.0–46.0)
Hemoglobin: 10.1 g/dL — ABNORMAL LOW (ref 12.0–15.0)
Hemoglobin: 11.2 g/dL — ABNORMAL LOW (ref 12.0–15.0)
MCH: 29.2 pg (ref 26.0–34.0)
MCH: 29.2 pg (ref 26.0–34.0)
MCHC: 32 g/dL (ref 30.0–36.0)
MCHC: 32 g/dL (ref 30.0–36.0)
MCV: 91.3 fL (ref 80.0–100.0)
MCV: 91.4 fL (ref 80.0–100.0)
Platelets: 350 10*3/uL (ref 150–400)
Platelets: 386 10*3/uL (ref 150–400)
RBC: 3.46 MIL/uL — ABNORMAL LOW (ref 3.87–5.11)
RBC: 3.83 MIL/uL — ABNORMAL LOW (ref 3.87–5.11)
RDW: 15 % (ref 11.5–15.5)
RDW: 15.1 % (ref 11.5–15.5)
WBC: 8.7 10*3/uL (ref 4.0–10.5)
WBC: 9.4 10*3/uL (ref 4.0–10.5)
nRBC: 0 % (ref 0.0–0.2)
nRBC: 0 % (ref 0.0–0.2)

## 2023-02-14 LAB — HEPATIC FUNCTION PANEL
ALT: 14 U/L (ref 0–44)
AST: 29 U/L (ref 15–41)
Albumin: 2.9 g/dL — ABNORMAL LOW (ref 3.5–5.0)
Alkaline Phosphatase: 110 U/L (ref 38–126)
Bilirubin, Direct: 0.1 mg/dL (ref 0.0–0.2)
Indirect Bilirubin: 0.5 mg/dL (ref 0.3–0.9)
Total Bilirubin: 0.6 mg/dL (ref 0.0–1.2)
Total Protein: 6.8 g/dL (ref 6.5–8.1)

## 2023-02-14 LAB — BASIC METABOLIC PANEL
Anion gap: 16 — ABNORMAL HIGH (ref 5–15)
BUN: 39 mg/dL — ABNORMAL HIGH (ref 8–23)
CO2: 23 mmol/L (ref 22–32)
Calcium: 8.6 mg/dL — ABNORMAL LOW (ref 8.9–10.3)
Chloride: 91 mmol/L — ABNORMAL LOW (ref 98–111)
Creatinine, Ser: 4.19 mg/dL — ABNORMAL HIGH (ref 0.44–1.00)
GFR, Estimated: 11 mL/min — ABNORMAL LOW (ref >60)
Glucose, Bld: 95 mg/dL (ref 70–99)
Potassium: 3 mmol/L — ABNORMAL LOW (ref 3.5–5.1)
Sodium: 130 mmol/L — ABNORMAL LOW (ref 135–145)

## 2023-02-14 LAB — TROPONIN I (HIGH SENSITIVITY)
Troponin I (High Sensitivity): 14 ng/L (ref <18)
Troponin I (High Sensitivity): 15 ng/L (ref <18)

## 2023-02-14 LAB — HEPATITIS B SURFACE ANTIGEN: Hepatitis B Surface Ag: NONREACTIVE

## 2023-02-14 LAB — PHOSPHORUS: Phosphorus: 3.5 mg/dL (ref 2.5–4.6)

## 2023-02-14 LAB — MRSA NEXT GEN BY PCR, NASAL: MRSA by PCR Next Gen: NOT DETECTED

## 2023-02-14 MED ORDER — POTASSIUM CHLORIDE 20 MEQ PO PACK
20.0000 meq | PACK | Freq: Once | ORAL | Status: AC
  Administered 2023-02-14: 20 meq via ORAL
  Filled 2023-02-14: qty 1

## 2023-02-14 MED ORDER — POTASSIUM CHLORIDE CRYS ER 20 MEQ PO TBCR
40.0000 meq | EXTENDED_RELEASE_TABLET | Freq: Once | ORAL | Status: AC
  Administered 2023-02-14: 40 meq via ORAL
  Filled 2023-02-14: qty 2

## 2023-02-14 MED ORDER — FLUTICASONE FUROATE-VILANTEROL 100-25 MCG/ACT IN AEPB
1.0000 | INHALATION_SPRAY | Freq: Every day | RESPIRATORY_TRACT | Status: DC
  Administered 2023-02-15 – 2023-02-20 (×6): 1 via RESPIRATORY_TRACT
  Filled 2023-02-14: qty 28

## 2023-02-14 MED ORDER — SODIUM CHLORIDE 0.9% FLUSH
3.0000 mL | Freq: Two times a day (BID) | INTRAVENOUS | Status: DC
  Administered 2023-02-14 – 2023-02-20 (×13): 3 mL via INTRAVENOUS

## 2023-02-14 MED ORDER — LIDOCAINE HCL (PF) 1 % IJ SOLN: 5.0000 mL | INTRAMUSCULAR | Status: DC | PRN

## 2023-02-14 MED ORDER — IPRATROPIUM-ALBUTEROL 0.5-2.5 (3) MG/3ML IN SOLN
3.0000 mL | Freq: Once | RESPIRATORY_TRACT | Status: AC
  Administered 2023-02-14: 3 mL via RESPIRATORY_TRACT
  Filled 2023-02-14: qty 3

## 2023-02-14 MED ORDER — AZITHROMYCIN 500 MG IV SOLR
500.0000 mg | INTRAVENOUS | Status: DC
  Administered 2023-02-14: 500 mg via INTRAVENOUS
  Filled 2023-02-14 (×2): qty 5

## 2023-02-14 MED ORDER — TORSEMIDE 20 MG PO TABS
60.0000 mg | ORAL_TABLET | Freq: Two times a day (BID) | ORAL | Status: DC
  Administered 2023-02-14 – 2023-02-20 (×12): 60 mg via ORAL
  Filled 2023-02-14 (×12): qty 3

## 2023-02-14 MED ORDER — CALCITRIOL 0.5 MCG PO CAPS: 0.5000 ug | ORAL_CAPSULE | Freq: Every day | ORAL | Status: DC

## 2023-02-14 MED ORDER — EZETIMIBE 10 MG PO TABS
10.0000 mg | ORAL_TABLET | Freq: Every day | ORAL | Status: DC
  Administered 2023-02-15 – 2023-02-20 (×6): 10 mg via ORAL
  Filled 2023-02-14 (×6): qty 1

## 2023-02-14 MED ORDER — CARVEDILOL 3.125 MG PO TABS: 6.2500 mg | ORAL_TABLET | Freq: Two times a day (BID) | ORAL | Status: DC

## 2023-02-14 MED ORDER — TORSEMIDE 20 MG PO TABS: 20.0000 mg | ORAL_TABLET | Freq: Two times a day (BID) | ORAL | Status: DC

## 2023-02-14 MED ORDER — DM-GUAIFENESIN ER 30-600 MG PO TB12
1.0000 | ORAL_TABLET | Freq: Two times a day (BID) | ORAL | Status: DC
  Administered 2023-02-14 – 2023-02-20 (×12): 1 via ORAL
  Filled 2023-02-14 (×15): qty 1

## 2023-02-14 MED ORDER — UMECLIDINIUM BROMIDE 62.5 MCG/ACT IN AEPB
1.0000 | INHALATION_SPRAY | Freq: Every day | RESPIRATORY_TRACT | Status: DC
  Administered 2023-02-16 – 2023-02-20 (×5): 1 via RESPIRATORY_TRACT
  Filled 2023-02-14 (×2): qty 7

## 2023-02-14 MED ORDER — ALBUTEROL SULFATE (2.5 MG/3ML) 0.083% IN NEBU: 2.5000 mg | INHALATION_SOLUTION | RESPIRATORY_TRACT | Status: DC | PRN

## 2023-02-14 MED ORDER — POLYETHYLENE GLYCOL 3350 17 G PO PACK
17.0000 g | PACK | Freq: Every day | ORAL | Status: DC | PRN
Start: 2023-02-14 — End: 2023-02-20

## 2023-02-14 MED ORDER — CARVEDILOL 3.125 MG PO TABS
3.1250 mg | ORAL_TABLET | Freq: Two times a day (BID) | ORAL | Status: DC
  Administered 2023-02-14 – 2023-02-20 (×12): 3.125 mg via ORAL
  Filled 2023-02-14 (×12): qty 1

## 2023-02-14 MED ORDER — QUETIAPINE FUMARATE 100 MG PO TABS
100.0000 mg | ORAL_TABLET | Freq: Every day | ORAL | Status: DC
  Administered 2023-02-15 – 2023-02-20 (×6): 100 mg via ORAL
  Filled 2023-02-14 (×6): qty 1

## 2023-02-14 MED ORDER — ESCITALOPRAM OXALATE 20 MG PO TABS
20.0000 mg | ORAL_TABLET | Freq: Every day | ORAL | Status: DC
  Administered 2023-02-15 – 2023-02-20 (×6): 20 mg via ORAL
  Filled 2023-02-14 (×6): qty 1

## 2023-02-14 MED ORDER — OSELTAMIVIR PHOSPHATE 30 MG PO CAPS
30.0000 mg | ORAL_CAPSULE | ORAL | Status: AC
  Administered 2023-02-14 – 2023-02-19 (×4): 30 mg via ORAL
  Filled 2023-02-14 (×5): qty 1

## 2023-02-14 MED ORDER — HYDROCODONE-ACETAMINOPHEN 5-325 MG PO TABS
1.0000 | ORAL_TABLET | Freq: Four times a day (QID) | ORAL | Status: DC | PRN
  Administered 2023-02-14 – 2023-02-20 (×15): 1 via ORAL
  Filled 2023-02-14 (×15): qty 1

## 2023-02-14 MED ORDER — VANCOMYCIN HCL IN DEXTROSE 1-5 GM/200ML-% IV SOLN: 1000.0000 mg | INTRAVENOUS | Status: DC

## 2023-02-14 MED ORDER — ACETAMINOPHEN 650 MG RE SUPP: 650.0000 mg | Freq: Four times a day (QID) | RECTAL | Status: DC | PRN

## 2023-02-14 MED ORDER — CARVEDILOL 12.5 MG PO TABS: 12.5000 mg | ORAL_TABLET | Freq: Two times a day (BID) | ORAL | Status: DC

## 2023-02-14 MED ORDER — CHLORHEXIDINE GLUCONATE CLOTH 2 % EX PADS
6.0000 | MEDICATED_PAD | Freq: Every day | CUTANEOUS | Status: DC
  Administered 2023-02-15 – 2023-02-20 (×5): 6 via TOPICAL

## 2023-02-14 MED ORDER — QUETIAPINE FUMARATE 100 MG PO TABS
200.0000 mg | ORAL_TABLET | Freq: Every day | ORAL | Status: DC
  Administered 2023-02-14 – 2023-02-19 (×6): 200 mg via ORAL
  Filled 2023-02-14 (×6): qty 2

## 2023-02-14 MED ORDER — ACETAMINOPHEN 500 MG PO TABS
1000.0000 mg | ORAL_TABLET | Freq: Once | ORAL | Status: AC
  Administered 2023-02-14: 1000 mg via ORAL
  Filled 2023-02-14: qty 2

## 2023-02-14 MED ORDER — MIRTAZAPINE 15 MG PO TABS
15.0000 mg | ORAL_TABLET | Freq: Every day | ORAL | Status: DC
  Administered 2023-02-14 – 2023-02-19 (×6): 15 mg via ORAL
  Filled 2023-02-14 (×6): qty 1

## 2023-02-14 MED ORDER — DARBEPOETIN ALFA 60 MCG/0.3ML IJ SOSY: 60.0000 ug | PREFILLED_SYRINGE | INTRAMUSCULAR | Status: DC

## 2023-02-14 MED ORDER — PANTOPRAZOLE SODIUM 40 MG PO TBEC
40.0000 mg | DELAYED_RELEASE_TABLET | Freq: Every day | ORAL | Status: DC
  Administered 2023-02-15 – 2023-02-20 (×6): 40 mg via ORAL
  Filled 2023-02-14 (×6): qty 1

## 2023-02-14 MED ORDER — DULOXETINE HCL 30 MG PO CPEP
30.0000 mg | ORAL_CAPSULE | Freq: Every day | ORAL | Status: DC
  Administered 2023-02-15 – 2023-02-20 (×6): 30 mg via ORAL
  Filled 2023-02-14 (×6): qty 1

## 2023-02-14 MED ORDER — VANCOMYCIN HCL 1500 MG/300ML IV SOLN
1500.0000 mg | Freq: Once | INTRAVENOUS | Status: AC
  Administered 2023-02-14: 1500 mg via INTRAVENOUS
  Filled 2023-02-14 (×2): qty 300

## 2023-02-14 MED ORDER — ACETAMINOPHEN 325 MG PO TABS
650.0000 mg | ORAL_TABLET | Freq: Four times a day (QID) | ORAL | Status: DC | PRN
  Filled 2023-02-14: qty 2

## 2023-02-14 MED ORDER — LIDOCAINE-PRILOCAINE 2.5-2.5 % EX CREA: 1.0000 | TOPICAL_CREAM | CUTANEOUS | Status: DC | PRN

## 2023-02-14 MED ORDER — ATORVASTATIN CALCIUM 10 MG PO TABS
20.0000 mg | ORAL_TABLET | Freq: Every day | ORAL | Status: DC
  Administered 2023-02-14 – 2023-02-19 (×6): 20 mg via ORAL
  Filled 2023-02-14 (×6): qty 2

## 2023-02-14 MED ORDER — HEPARIN SODIUM (PORCINE) 1000 UNIT/ML DIALYSIS
1000.0000 [IU] | INTRAMUSCULAR | Status: DC | PRN
  Administered 2023-02-14 – 2023-02-17 (×2): 3200 [IU] via INTRAVENOUS_CENTRAL
  Filled 2023-02-14 (×4): qty 1

## 2023-02-14 MED ORDER — HEPARIN SODIUM (PORCINE) 5000 UNIT/ML IJ SOLN
5000.0000 [IU] | Freq: Three times a day (TID) | INTRAMUSCULAR | Status: DC
  Administered 2023-02-15 (×2): 5000 [IU] via SUBCUTANEOUS
  Filled 2023-02-14 (×2): qty 1

## 2023-02-14 MED ORDER — SODIUM CHLORIDE 0.9 % IV SOLN
2.0000 g | INTRAVENOUS | Status: DC
  Administered 2023-02-14: 2 g via INTRAVENOUS
  Filled 2023-02-14: qty 20

## 2023-02-14 MED ORDER — PENTAFLUOROPROP-TETRAFLUOROETH EX AERO: 1.0000 | INHALATION_SPRAY | CUTANEOUS | Status: DC | PRN

## 2023-02-14 MED ORDER — CALCITRIOL 0.25 MCG PO CAPS
0.2500 ug | ORAL_CAPSULE | ORAL | Status: DC
  Administered 2023-02-16 – 2023-02-20 (×3): 0.25 ug via ORAL
  Filled 2023-02-14 (×3): qty 1

## 2023-02-14 MED ORDER — ALTEPLASE 2 MG IJ SOLR: 2.0000 mg | Freq: Once | INTRAMUSCULAR | Status: DC | PRN

## 2023-02-14 NOTE — Progress Notes (Signed)
   02/14/23 1800  Vitals  Temp 98.3 F (36.8 C)  Pulse Rate 85  Resp 19  BP 126/86  SpO2 100 %  O2 Device Nasal Cannula  Weight  (unable to weigh---ed stretcher)  Type of Weight Post-Dialysis  Oxygen Therapy  O2 Flow Rate (L/min) 3 L/min  Patient Activity (if Appropriate) In bed  Pulse Oximetry Type Continuous  Oximetry Probe Site Changed No  Post Treatment  Dialyzer Clearance Lightly streaked  Hemodialysis Intake (mL) 300 mL  Liters Processed 73.5  Fluid Removed (mL) 3800 mL  Tolerated HD Treatment Yes   Received patient in bed to unit.  Alert and oriented.  Informed consent signed and in chart.   TX duration:3.30  Patient tolerated well.  Transported back to the room ---being admitted to 5w12 Alert, without acute distress.  Hand-off given to patient's nurse.   Access used: [facility]--- Access issues: lines reversed at the onset of the tx  Total UF removed: 3500 Medication(s) given: vancomycin 1500mg  iv x 1   [facility]

## 2023-02-14 NOTE — H&P (Signed)
History and Physical    Patient: Tiffany [name] VHQ:[id number] DOB: [birth year] DOA: 02/13/2023 DOS: the patient was seen and examined on 02/14/2023 PCP: [name], MD  Patient coming from: Home  Chief Complaint:  Chief Complaint  Patient presents with   Cough   Generalized Body Aches   HPI: Tiffany [name] is a 63 y.o. female with medical history significant of end-stage renal disease on dialysis Monday Wednesday Friday.  Patient reports being typically wheelchair-bound due to paraparesis due to neuropathy.  Patient seems to have been in her usual state of health till around January 23, 6 days ago when she developed a new onset cough.  Patient was actually evaluated in the ER on January 25.  Patient was found to have fever 101 F at the time.  Patient was diagnosed with influenza A and discharged home at the time in reasonable condition.  Patient returned yesterday with complaints of chest pain with coughing, as well as abdominal pain during coughing spells.  [name] also having poor appetite generalized bodyaches and malaise.  In the ER patient was noted to have hypoxia requiring 4 L/min of supplementary oxygen to maintain sats over 92%.  Patient was also found to have a low potassium 2.9 and sodium 130.  Troponin minimally elevated at 20.  Chest x-ray is pending radiology review.  However shows massive infiltration on the left lower lung field.  Patient continues to report abdominal and chest wall discomfort with coughing spells, otherwise does not report any discomfort does not report any shortness of breath with supplementary oxygen.  Patient has been able to tolerate diet.  Currently no diarrhea or constipation.  Review of Systems: As mentioned in the history of present illness. All other systems reviewed and are negative. Past Medical History:  Diagnosis Date   Abdominal pain    Acute encephalopathy 11/21/2017   Acute exacerbation of CHF (congestive heart failure) (HCC) 03/03/2022    Acute GI bleeding 03/29/2021   Acute kidney injury superimposed on chronic kidney disease (HCC) 02/18/2020   Acute metabolic encephalopathy 02/20/2020   Acute on chronic diastolic heart failure (HCC) 02/04/2022   Acute on chronic renal failure (HCC) 06/02/2022   Aggressive behavior    Agitation 11/22/2017   Anoxic brain injury (HCC) 09/08/2016   C. Arrest due to respiratory failure and COPD exacerbation   Anxiety    Arthritis    "all over" (04/10/2016)   Asthma 10/18/2010   Binge eating disorder    Blood loss anemia 04/24/2011   CAP (community acquired pneumonia) 06/22/2015   Cardiac arrest (HCC) 09/08/2016   PEA   Carotid artery stenosis    1-39% bilateral by dopplers 11/2016   Chronic diastolic (congestive) heart failure (HCC)    Chronic kidney disease, stage 3 (HCC)    Chronic kidney disease, stage 3b (HCC) 06/06/2021   Chronic pain syndrome 06/18/2012   Chronic post-traumatic stress disorder (PTSD) 05/27/2018   Chronic respiratory failure with hypoxia and hypercapnia (HCC) 06/22/2015   TRILOGY Vent >AVAPA-ES., Vt target 200-400, Max P 30 , PS max 20 , PS min 6-10 , E Max 6, E Min 4, Rate Auto AVAPS Rate 2 (titrate for pt comfort) , bleed O2 at 5l/m continuous flow .    CKD (chronic kidney disease) 02/03/2022   Closed displaced fracture of fifth metacarpal bone 03/21/2018   Cocaine use disorder, severe, in sustained remission (HCC) 12/17/2015   Complication of anesthesia    decreased bp, decreased heart rate   COPD (chronic  obstructive pulmonary disease) (HCC) 07/08/2014   Delusional disorder, persecutory type (HCC) 06/26/2021   Depression    Diabetic neuropathy (HCC) 04/24/2011   Difficulty with speech 01/24/2018   Disorder of nervous system    Drug abuse (HCC) 11/21/2017   Dyslipidemia 04/24/2011   E. coli UTI 02/20/2020   Elevated troponin 04/28/2012   Emphysema    Encephalopathy 11/21/2017   Essential hypertension 03/22/2016   Facet arthropathy, lumbar 06/12/2022    Fibula fracture 07/10/2016   Frequent falls 10/11/2017   GERD (gastroesophageal reflux disease)    GI bleed 03/30/2021   Gout 04/11/2017   Heart attack (HCC) 1980s   Hematochezia    History of blood transfusion 1994   "couldn't stop bleeding from my period"   History of drug abuse in remission (HCC) 11/28/2015   Quit in 2017   Hyperlipidemia 03/31/2021   Hyperlipidemia LDL goal <70    Hypocalcemia    Hypokalemia    Hypomagnesemia    Incontinence    Manic depression (HCC)    Morbid obesity (HCC) 10/18/2010   Obstructive sleep apnea 10/18/2010   On home oxygen therapy    "6L; 24/7" (04/10/2016)   OSA on CPAP    "wear mask sometimes" (04/10/2016)   Painless rectal bleeding 12/31/2015   Paranoid (HCC)    "sometimes; I'm on RX for it" (04/10/2016)   Periumbilical abdominal pain    Prolonged Q-T interval on ECG    Rectal bleeding 12/31/2015   Recurrent syncope 07/09/2021   Rhabdomyolysis 06/16/2021   Schizoaffective disorder, bipolar type (HCC) 04/05/2018   Seasonal allergies    Seborrheic keratoses 12/31/2013   Seizures (HCC)    "don't know what kind; last one was ~ 1 yr ago" (04/10/2016)   Sinus bradycardia    Skin ulcer of sacrum, limited to breakdown of skin (HCC) 03/08/2022   Spondylolisthesis at L3-L4 level 06/12/2022   Stroke (HCC) 1980s   denies residual on 04/10/2016   Thrush 09/19/2013   Type 2 diabetes mellitus (HCC) 10/18/2010   Past Surgical History:  Procedure Laterality Date   A/V FISTULAGRAM N/A 02/01/2023   Procedure: A/V Fistulagram;  Surgeon: Tyler Pita, MD;  Location: [facility];  Service: Cardiovascular;  Laterality: N/A;   AV FISTULA PLACEMENT Left 12/21/2022   Procedure: INSERTION OF LEFT ARM ARTERIOVENOUS (AV) GORE-TEX GRAFT;  Surgeon: Leonie Douglas, MD;  Location: [facility] OR;  Service: Vascular;  Laterality: Left;   CESAREAN SECTION  1997   COLONOSCOPY WITH PROPOFOL N/A 04/01/2021   Procedure: COLONOSCOPY WITH PROPOFOL;  Surgeon: Jeani Hawking, MD;  Location: [facility] ENDOSCOPY;  Service: Gastroenterology;  Laterality: N/A;  Rectal bleeding with drop in hemoglobin to 7.2 g/dL   HERNIA REPAIR     IR CHOLANGIOGRAM EXISTING TUBE  07/20/2016   IR PERC CHOLECYSTOSTOMY  05/10/2016   IR RADIOLOGIST EVAL & MGMT  06/08/2016   IR RADIOLOGIST EVAL & MGMT  06/29/2016   IR SINUS/FIST TUBE CHK-NON GI  07/12/2016   RIGHT/LEFT HEART CATH AND CORONARY ANGIOGRAPHY N/A 06/19/2017   Procedure: RIGHT/LEFT HEART CATH AND CORONARY ANGIOGRAPHY;  Surgeon: Dolores Patty, MD;  Location: [facility];  Service: Cardiovascular;  Laterality: N/A;   TIBIA IM NAIL INSERTION Right 07/12/2016   Procedure: INTRAMEDULLARY (IM) NAIL RIGHT TIBIA;  Surgeon: Tarry Kos, MD;  Location: [facility] OR;  Service: Orthopedics;  Laterality: Right;   UMBILICAL HERNIA REPAIR  ~ 1963   "that's why I don't have a belly button"   VAGINAL  HYSTERECTOMY     Social History:  reports that she quit smoking about 6 years ago. Her smoking use included cigarettes. She started smoking about 45 years ago. She has a 58.6 pack-year smoking history. She has been exposed to tobacco smoke. She has never used smokeless tobacco. She reports that she does not currently use drugs after having used the following drugs: Cocaine. She reports that she does not drink alcohol.  Allergies  Allergen Reactions   Asa [Aspirin] Other (See Comments)    GI bleeds   Hydrocodone Shortness Of Breath   Latuda [Lurasidone Hcl] Anaphylaxis   Magnesium-Containing Compounds Anaphylaxis and Other (See Comments)    Tolerated Ensure   Prednisone Anaphylaxis, Swelling and Other (See Comments)    Swelling of tongue, lips, throat   Ultram [Tramadol] Anaphylaxis, Swelling and Rash   Codeine Nausea And Vomiting and Rash   Desyrel [Trazodone] Other (See Comments)    Paranoia    Hydralazine Other (See Comments)    Muscle spasms   Peanut (Diagnostic) Hives and Other (See Comments)    Raw peanuts only  OK to eat  peanut butter.   Topamax [Topiramate] Other (See Comments)    Paranoia   Sulfa Antibiotics Itching   Tape Rash    Family History  Problem Relation Age of Onset   Cancer Mother        lung   Depression Mother    Cancer Father        prostate   Depression Sister    Anxiety disorder Sister    Schizophrenia Sister    Bipolar disorder Sister    Depression Sister    Depression Brother    Heart failure Other        cousin    Prior to Admission medications   Medication Sig Start Date End Date Taking? Authorizing Provider  albuterol (VENTOLIN HFA) 108 (90 Base) MCG/ACT inhaler INHALE 2 PUFFS INTO THE LUNGS EVERY 6 HOURS AS NEEDED FOR WHEEZING OR SHORTNESS OF BREATH 10/24/22   Tiffany Kocher, DO  alteplase (CATHFLO ACTIVASE) 2 MG injection 2 mg by Intracatheter route once as needed for open catheter. 11/22/22   Sarina Ill, DO  amLODipine (NORVASC) 10 MG tablet Take 1 tablet (10 mg total) by mouth daily. 10/09/22   McDiarmid, Leighton Roach, MD  atorvastatin (LIPITOR) 20 MG tablet Take 1 tablet (20 mg total) by mouth daily. 10/09/22   McDiarmid, Leighton Roach, MD  calcitRIOL (ROCALTROL) 0.5 MCG capsule Take 1 capsule (0.5 mcg total) by mouth daily. 10/09/22   McDiarmid, Leighton Roach, MD  carvedilol (COREG) 12.5 MG tablet Take 1 tablet (12.5 mg total) by mouth 2 (two) times daily. 10/09/22   McDiarmid, Leighton Roach, MD  cyclobenzaprine (FLEXERIL) 10 MG tablet Take 1 tablet (10 mg total) by mouth 3 (three) times daily. 10/09/22   McDiarmid, Leighton Roach, MD  cyclobenzaprine (FLEXERIL) 10 MG tablet take 1 tablet by mouth three times daily as needed 02/06/23     dicyclomine (BENTYL) 10 MG capsule Take 1 capsule (10 mg total) by mouth 3 (three) times daily before meals. 10/09/22   McDiarmid, Leighton Roach, MD  DULoxetine (CYMBALTA) 30 MG capsule Take 1 capsule (30 mg total) by mouth daily. 10/09/22   McDiarmid, Leighton Roach, MD  epoetin alfa (EPOGEN) 10000 UNIT/ML injection Inject 1 mL (10,000 Units total) into the vein every Monday,  Wednesday, and Friday with hemodialysis. 11/22/22   Sarina Ill, DO  escitalopram (LEXAPRO) 20 MG tablet Take 1 tablet (20  mg total) by mouth daily. 10/09/22   McDiarmid, Leighton Roach, MD  ezetimibe (ZETIA) 10 MG tablet Take 1 tablet (10 mg total) by mouth daily. 10/09/22   McDiarmid, Leighton Roach, MD  ferrous sulfate 325 (65 FE) MG tablet Take 1 tablet (325 mg total) by mouth every other day. 10/09/22 10/04/23  McDiarmid, Leighton Roach, MD  Fluticasone-Umeclidin-Vilant (TRELEGY ELLIPTA) 100-62.5-25 MCG/ACT AEPB INHALE 1 PUFF INTO THE LUNGS DAILY 03/20/22   Tiffany Kocher, DO  folic acid (FOLVITE) 1 MG tablet Take 1 tablet (1 mg total) by mouth daily. 10/09/22   McDiarmid, Leighton Roach, MD  HYDROcodone-acetaminophen (NORCO) 5-325 MG tablet Take 1 tablet by mouth every 6 (six) hours as needed for moderate pain (pain score 4-6). 01/03/23   Lars Mage, PA-C  ibuprofen (ADVIL) 200 MG tablet Take 400 mg by mouth 3 (three) times daily.    [provider]  lamoTRIgine (LAMICTAL) 100 MG tablet Take 1 tablet (100 mg total) by mouth daily. 10/09/22   McDiarmid, Leighton Roach, MD  loperamide (IMODIUM) 2 MG capsule Take 1 capsule (2 mg total) by mouth 2 (two) times daily. 10/09/22   McDiarmid, Leighton Roach, MD  melatonin 5 MG TABS Take 1 tablet (5 mg total) by mouth at bedtime. 11/22/22   Sarina Ill, DO  metolazone (ZAROXOLYN) 2.5 MG tablet Take 2.5 mg by mouth every Monday, Wednesday, and Friday. 11/16/22   [provider]  mirtazapine (REMERON) 15 MG tablet Take 15 mg by mouth daily. 12/07/22   [provider]  omeprazole (PRILOSEC) 40 MG capsule Take 1 capsule (40 mg total) by mouth every morning. 10/09/22   McDiarmid, Leighton Roach, MD  oxyCODONE (ROXICODONE) 5 MG immediate release tablet Take 1 tablet (5 mg total) by mouth every 6 (six) hours as needed. 12/21/22 12/21/23  Emilie Rutter, PA-C  potassium chloride SA (KLOR-CON M) 20 MEQ tablet Take 20 mEq by mouth See admin instructions. Take 1 tablet by  mouth for 3 days    [provider]  prazosin (MINIPRESS) 2 MG capsule Take 1 capsule (2 mg total) by mouth at bedtime. 10/09/22   McDiarmid, Leighton Roach, MD  promethazine (PHENERGAN) 6.25 MG/5ML solution Take 5 ml by mouth every 4-6 hours as needed for cough and nausea 02/06/23     QUEtiapine (SEROQUEL) 100 MG tablet Take 1 tablet (100 mg total) by mouth daily. 11/22/22   Sarina Ill, DO  QUEtiapine (SEROQUEL) 100 MG tablet Take 1 tablet (100 mg total) by mouth in the morning AND 2 tablets (200 mg total) every evening. 02/06/23   [name], MD  QUEtiapine (SEROQUEL) 200 MG tablet Take 1 tablet (200 mg total) by mouth at bedtime. 11/22/22   Sarina Ill, DO  torsemide (DEMADEX) 20 MG tablet Take 1 tablet (20 mg total) by mouth 2 (two) times daily. 11/09/22     torsemide (DEMADEX) 20 MG tablet Take 1 tablet (20 mg total) by mouth 2 (two) times daily. 02/13/23       Physical Exam: Vitals:   02/13/23 1918 02/13/23 2222 02/14/23 0425 02/14/23 0620  BP:  (!) 139/90  117/88  Pulse:  71 74 76  Resp:  18 (!) 26 19  Temp:  98.1 F (36.7 C)    TempSrc:  Oral    SpO2:  98% 99% 98%  Weight: 88.5 kg     Height: 5\' 3"  (1.6 m)      General: Alert and awake, gives a coherent account of her symptoms appears  to be in no distress on 4 L/min of supplementary oxygen Respiratory exam: Bilateral air entry is vesicular I did not appreciate any crackles or wheezes Abdomen soft nontender S1-S2 normal for cardiovascular exam Extremities warm without edema Paraparesis is noted.  Patient is generally pretty deconditioned. Data Reviewed:  Labs on Admission:  Results for orders placed or performed during the hospital encounter of 02/13/23 (from the past 24 hours)  CBC with Differential     Status: Abnormal   Collection Time: 02/13/23  7:57 PM  Result Value Ref Range   WBC 8.7 4.0 - 10.5 K/uL   RBC 3.40 (L) 3.87 - 5.11 MIL/uL   Hemoglobin 9.9 (L) 12.0 - 15.0 g/dL   HCT 91.4 (L) 78.2 -  46.0 %   MCV 91.5 80.0 - 100.0 fL   MCH 29.1 26.0 - 34.0 pg   MCHC 31.8 30.0 - 36.0 g/dL   RDW 95.6 21.3 - 08.6 %   Platelets 340 150 - 400 K/uL   nRBC 0.0 0.0 - 0.2 %   Neutrophils Relative % 71 %   Neutro Abs 6.2 1.7 - 7.7 K/uL   Lymphocytes Relative 17 %   Lymphs Abs 1.5 0.7 - 4.0 K/uL   Monocytes Relative 9 %   Monocytes Absolute 0.8 0.1 - 1.0 K/uL   Eosinophils Relative 2 %   Eosinophils Absolute 0.2 0.0 - 0.5 K/uL   Basophils Relative 0 %   Basophils Absolute 0.0 0.0 - 0.1 K/uL   Immature Granulocytes 1 %   Abs Immature Granulocytes 0.06 0.00 - 0.07 K/uL  Basic metabolic panel     Status: Abnormal   Collection Time: 02/13/23  7:57 PM  Result Value Ref Range   Sodium 130 (L) 135 - 145 mmol/L   Potassium 2.9 (L) 3.5 - 5.1 mmol/L   Chloride 95 (L) 98 - 111 mmol/L   CO2 19 (L) 22 - 32 mmol/L   Glucose, Bld 83 70 - 99 mg/dL   BUN 35 (H) 8 - 23 mg/dL   Creatinine, Ser 5.78 (H) 0.44 - 1.00 mg/dL   Calcium 8.7 (L) 8.9 - 10.3 mg/dL   GFR, Estimated 13 (L) >60 mL/min   Anion gap 16 (H) 5 - 15  Troponin I (High Sensitivity)     Status: Abnormal   Collection Time: 02/13/23  7:57 PM  Result Value Ref Range   Troponin I (High Sensitivity) 20 (H) <18 ng/L  CBC     Status: Abnormal   Collection Time: 02/14/23  8:28 AM  Result Value Ref Range   WBC 8.7 4.0 - 10.5 K/uL   RBC 3.46 (L) 3.87 - 5.11 MIL/uL   Hemoglobin 10.1 (L) 12.0 - 15.0 g/dL   HCT 46.9 (L) 62.9 - 52.8 %   MCV 91.3 80.0 - 100.0 fL   MCH 29.2 26.0 - 34.0 pg   MCHC 32.0 30.0 - 36.0 g/dL   RDW 41.3 24.4 - 01.0 %   Platelets 350 150 - 400 K/uL   nRBC 0.0 0.0 - 0.2 %  Basic metabolic panel     Status: Abnormal   Collection Time: 02/14/23  8:28 AM  Result Value Ref Range   Sodium 130 (L) 135 - 145 mmol/L   Potassium 3.0 (L) 3.5 - 5.1 mmol/L   Chloride 91 (L) 98 - 111 mmol/L   CO2 23 22 - 32 mmol/L   Glucose, Bld 95 70 - 99 mg/dL   BUN 39 (H) 8 - 23 mg/dL   Creatinine,  Ser 4.19 (H) 0.44 - 1.00 mg/dL   Calcium  8.6 (L) 8.9 - 10.3 mg/dL   GFR, Estimated 11 (L) >60 mL/min   Anion gap 16 (H) 5 - 15   *Note: Due to a large number of results and/or encounters for the requested time period, some results have not been displayed. A complete set of results can be found in Results Review.   Basic Metabolic Panel: Recent Labs  Lab 02/13/23 1957 02/14/23 0828  NA 130* 130*  K 2.9* 3.0*  CL 95* 91*  CO2 19* 23  GLUCOSE 83 95  BUN 35* 39*  CREATININE 3.84* 4.19*  CALCIUM 8.7* 8.6*   Liver Function Tests: No results for input(s): "AST", "ALT", "ALKPHOS", "BILITOT", "PROT", "ALBUMIN" in the last 168 hours. No results for input(s): "LIPASE", "AMYLASE" in the last 168 hours. No results for input(s): "AMMONIA" in the last 168 hours. CBC: Recent Labs  Lab 02/13/23 1957 02/14/23 0828  WBC 8.7 8.7  NEUTROABS 6.2  --   HGB 9.9* 10.1*  HCT 31.1* 31.6*  MCV 91.5 91.3  PLT 340 350   Cardiac Enzymes: Recent Labs  Lab 02/13/23 0623 02/13/23 1957  TROPONINIHS 14 20*    BNP (last 3 results) Recent Labs    05/05/22 1315 05/19/22 1408  PROBNP 1,007* 2,269*   CBG: No results for input(s): "GLUCAP" in the last 168 hours.  Radiological Exams on Admission:  No results found.  chest X-ray : left lower lung filed infiltration.      Assessment and Plan: * Acute hypoxic respiratory failure (HCC) Patient typically on not any supplementary oxygen at home.  Patient diagnosed with influenza A approximately 4 days ago.  Now returns with increasing chest pain with coughing, found to be requiring 4 L/min of supplementary oxygen.  Chest x-ray shows new infiltrate of the left lower lung field.  Concern for bacterial pneumonia on top of recent viral pneumonia.  Will start treatment with vancomycin plus ceftriaxone plus azithromycin please.  Will draw blood cultures.  Patient will require admission to progressive unit.  Given her immunocompromised status.  Influenza A Patient started treatment on Tamiflu  Monday Wednesday Friday for 4 doses.  Continue with same.  Schizophrenia (HCC) Continue Cymbalta Lexapro Remeron and Seroquel.  Frequent falls Patient has chronic paraparesis apparently due to neuropathy.  Patient wheelchair dependent at home.  Chest pain Treated with abdominal panel and closely correlated with coughing spells versus with pneumonia.  Troponin minimally elevated nonspecific.  Will treat with cough suppressants as well as hydrocodone as needed.  Acute renal failure superimposed on stage 4 chronic kidney disease (HCC) Patient is dialysis dependent, I did discuss case with Dr. Leonides Sake who will add the patient to her schedule for today.  Patient does make urine still, continue with torsemide.  Home med rec should be reviewed after pharmcy input please    Advance Care Planning:   Code Status: Full Code   Consults: none at this time.  Family Communication: per patient.  Severity of Illness: The appropriate patient status for this patient is INPATIENT. Inpatient status is judged to be reasonable and necessary in order to provide the required intensity of service to ensure the patient's safety. The patient's presenting symptoms, physical exam findings, and initial radiographic and laboratory data in the context of their chronic comorbidities is felt to place them at high risk for further clinical deterioration. Furthermore, it is not anticipated that the patient will be medically stable for discharge from the hospital within  2 midnights of admission.   * I certify that at the point of admission it is my clinical judgment that the patient will require inpatient hospital care spanning beyond 2 midnights from the point of admission due to high intensity of service, high risk for further deterioration and high frequency of surveillance required.*  Author: Nolberto Hanlon, MD 02/14/2023 11:23 AM  For on call review [email or web address].

## 2023-02-14 NOTE — Consult Note (Signed)
ESRD Consult Note  Requesting provider: Nolberto Hanlon Service requesting consult: ER Reason for consult: ESRD, provision of dialysis Indication for acute dialysis?: End Stage Renal Disease  Outpatient dialysis unit: [facility] Outpatient dialysis prescription: BFR 500, 3K, EDW 89kg, 4hr, 2ca, TDC, mircera given 1/27, on IV iron, calcitriol 0.37mcg with each treatment   Assessment/Recommendations: Tiffany [name] is a/an 63 y.o. female with a past medical history notable for ESRD on HD admitted with influenza infection.   # ESRD:  Plan for HD today and maintain MWF schedule  # Volume/ hypertension: EDW 89kg. BP fairly low outpatient. Stop amlodipine. Lower Coreg.  Chronic volume overload and persistent today.  Ultrafiltration as tolerated.  Increase torsemide to 60mg  BID. Obtain chest x-ray given shortness of breath  # Anemia of Chronic Kidney Disease: Hemoglobin 10.1. No iv iron for now. ESA when due  # Secondary Hyperparathyroidism/Hyperphosphatemia: Cont calcitriol. Obtain phos level  # Vascular access: TDC with no issues  # Hypokalemia: Replace as needed.  High K bath  # Influenza infection: Management per primary team  # Additional recommendations: - Dose all meds for creatinine clearance < 10 ml/min  - Unless absolutely necessary, no MRIs with gadolinium.  - Implement save arm precautions.  Prefer needle sticks in the dorsum of the hands or wrists.  No blood pressure measurements in arm. - If blood transfusion is requested during hemodialysis sessions, please alert [name] prior to the session.  - Use synthetic opioids (Fentanyl/Dilaudid) if needed  Recommendations were discussed with the primary team.   History of Present Illness: Tiffany [name] is a/an 63 y.o. female with a past medical history of ESRD who presents with cough and generalized bodyaches.  The patient was altered on my assessment so history was obtained largely per chart review..  The patient was seen in the  emergency department on 1/25 for shortness of breath as well as body aches, cough, congestion, fevers, chills.  She was found to have influenza.  She was discharged home.  Patient presented again today with similar symptoms including cough and shortness of breath.  Shortness of breath been getting worse for several days prior to arrival.  She last had dialysis on Monday with no issues.  Because of the patient's altered mental status history is otherwise limited.  Unable to obtain review of systems.  In the emergency department the patient's labs were overall reassuring but did have low potassium at 3.  Hemoglobin 10.1.  No chest x-ray available at this time.   Medications:  Current Facility-Administered Medications  Medication Dose Route Frequency Provider Last Rate Last Admin   acetaminophen (TYLENOL) tablet 650 mg  650 mg Oral Q6H PRN Nolberto Hanlon, MD       Or   acetaminophen (TYLENOL) suppository 650 mg  650 mg Rectal Q6H PRN Nolberto Hanlon, MD       albuterol (PROVENTIL) (2.5 MG/3ML) 0.083% nebulizer solution 2.5 mg  2.5 mg Nebulization Q2H PRN Crosley, Debby, MD       alteplase (CATHFLO ACTIVASE) injection 2 mg  2 mg Intracatheter Once PRN Darnell Level, MD       calcitRIOL (ROCALTROL) capsule 0.5 mcg  0.5 mcg Oral Daily Nolberto Hanlon, MD       carvedilol (COREG) tablet 12.5 mg  12.5 mg Oral BID Nolberto Hanlon, MD       Chlorhexidine Gluconate Cloth 2 % PADS 6 each  6 each Topical Q0600 Darnell Level, MD       Darbepoetin Alfa (  ARANESP) injection 60 mcg  60 mcg Subcutaneous Q7 days Nolberto Hanlon, MD       dextromethorphan-guaiFENesin [facility] DM) 30-600 MG per 12 hr tablet 1 tablet  1 tablet Oral BID Nolberto Hanlon, MD       DULoxetine (CYMBALTA) DR capsule 30 mg  30 mg Oral Daily Nolberto Hanlon, MD       escitalopram (LEXAPRO) tablet 20 mg  20 mg Oral Daily Nolberto Hanlon, MD       fluticasone furoate-vilanterol (BREO ELLIPTA) 100-25 MCG/ACT 1 puff  1 puff Inhalation Daily Nolberto Hanlon, MD       And    umeclidinium bromide (INCRUSE ELLIPTA) 62.5 MCG/ACT 1 puff  1 puff Inhalation Daily Nolberto Hanlon, MD       heparin injection 1,000 Units  1,000 Units Dialysis PRN Darnell Level, MD       [START ON 02/15/2023] heparin injection 5,000 Units  5,000 Units Subcutaneous Q8H Nolberto Hanlon, MD       HYDROcodone-acetaminophen (NORCO/VICODIN) 5-325 MG per tablet 1 tablet  1 tablet Oral Q6H PRN Nolberto Hanlon, MD       lidocaine (PF) (XYLOCAINE) 1 % injection 5 mL  5 mL Intradermal PRN Darnell Level, MD       lidocaine-prilocaine (EMLA) cream 1 Application  1 Application Topical PRN Darnell Level, MD       mirtazapine (REMERON) tablet 15 mg  15 mg Oral Daily Nolberto Hanlon, MD       oseltamivir (TAMIFLU) capsule 30 mg  30 mg Oral Q M,W,F-2000 Crosley, Debby, MD   30 mg at 02/14/23 1610   pantoprazole (PROTONIX) EC tablet 40 mg  40 mg Oral Daily Nolberto Hanlon, MD       pentafluoroprop-tetrafluoroeth (GEBAUERS) aerosol 1 Application  1 Application Topical PRN Darnell Level, MD       polyethylene glycol (MIRALAX / GLYCOLAX) packet 17 g  17 g Oral Daily PRN Nolberto Hanlon, MD       potassium chloride (KLOR-CON) packet 20 mEq  20 mEq Oral Once Nolberto Hanlon, MD       QUEtiapine (SEROQUEL) tablet 100 mg  100 mg Oral Daily Nolberto Hanlon, MD       QUEtiapine (SEROQUEL) tablet 200 mg  200 mg Oral QHS Nolberto Hanlon, MD       sodium chloride flush (NS) 0.9 % injection 3 mL  3 mL Intravenous Q12H Nolberto Hanlon, MD   3 mL at 02/14/23 9604   torsemide (DEMADEX) tablet 20 mg  20 mg Oral BID Nolberto Hanlon, MD       Current Outpatient Medications  Medication Sig Dispense Refill   albuterol (VENTOLIN HFA) 108 (90 Base) MCG/ACT inhaler INHALE 2 PUFFS INTO THE LUNGS EVERY 6 HOURS AS NEEDED FOR WHEEZING OR SHORTNESS OF BREATH 8.5 g 2   alteplase (CATHFLO ACTIVASE) 2 MG injection 2 mg by Intracatheter route once as needed for open catheter. 1 each 3   amLODipine (NORVASC) 10 MG tablet Take 1 tablet (10 mg total) by mouth daily. 30  tablet 5   atorvastatin (LIPITOR) 20 MG tablet Take 1 tablet (20 mg total) by mouth daily. 30 tablet 5   calcitRIOL (ROCALTROL) 0.5 MCG capsule Take 1 capsule (0.5 mcg total) by mouth daily. 30 capsule 5   carvedilol (COREG) 12.5 MG tablet Take 1 tablet (12.5 mg total) by mouth 2 (two) times daily. 60 tablet 5   cyclobenzaprine (FLEXERIL) 10 MG tablet Take 1 tablet (10 mg total) by mouth  3 (three) times daily. 90 tablet 5   cyclobenzaprine (FLEXERIL) 10 MG tablet take 1 tablet by mouth three times daily as needed 90 tablet 2   dicyclomine (BENTYL) 10 MG capsule Take 1 capsule (10 mg total) by mouth 3 (three) times daily before meals. 90 capsule 5   DULoxetine (CYMBALTA) 30 MG capsule Take 1 capsule (30 mg total) by mouth daily. 30 capsule 5   epoetin alfa (EPOGEN) 10000 UNIT/ML injection Inject 1 mL (10,000 Units total) into the vein every Monday, Wednesday, and Friday with hemodialysis. 1 mL 3   escitalopram (LEXAPRO) 20 MG tablet Take 1 tablet (20 mg total) by mouth daily. 30 tablet 5   ezetimibe (ZETIA) 10 MG tablet Take 1 tablet (10 mg total) by mouth daily. 30 tablet 5   ferrous sulfate 325 (65 FE) MG tablet Take 1 tablet (325 mg total) by mouth every other day. 30 tablet 5   Fluticasone-Umeclidin-Vilant (TRELEGY ELLIPTA) 100-62.5-25 MCG/ACT AEPB INHALE 1 PUFF INTO THE LUNGS DAILY 60 each 0   folic acid (FOLVITE) 1 MG tablet Take 1 tablet (1 mg total) by mouth daily. 30 tablet 5   HYDROcodone-acetaminophen (NORCO) 5-325 MG tablet Take 1 tablet by mouth every 6 (six) hours as needed for moderate pain (pain score 4-6). 10 tablet 0   ibuprofen (ADVIL) 200 MG tablet Take 400 mg by mouth 3 (three) times daily.     lamoTRIgine (LAMICTAL) 100 MG tablet Take 1 tablet (100 mg total) by mouth daily. 30 tablet 5   loperamide (IMODIUM) 2 MG capsule Take 1 capsule (2 mg total) by mouth 2 (two) times daily. 30 capsule 5   melatonin 5 MG TABS Take 1 tablet (5 mg total) by mouth at bedtime. 30 tablet 0    metolazone (ZAROXOLYN) 2.5 MG tablet Take 2.5 mg by mouth every Monday, Wednesday, and Friday.     mirtazapine (REMERON) 15 MG tablet Take 15 mg by mouth daily.     omeprazole (PRILOSEC) 40 MG capsule Take 1 capsule (40 mg total) by mouth every morning. 30 capsule 5   oxyCODONE (ROXICODONE) 5 MG immediate release tablet Take 1 tablet (5 mg total) by mouth every 6 (six) hours as needed. 20 tablet 0   potassium chloride SA (KLOR-CON M) 20 MEQ tablet Take 20 mEq by mouth See admin instructions. Take 1 tablet by mouth for 3 days     prazosin (MINIPRESS) 2 MG capsule Take 1 capsule (2 mg total) by mouth at bedtime. 30 capsule 5   promethazine (PHENERGAN) 6.25 MG/5ML solution Take 5 ml by mouth every 4-6 hours as needed for cough and nausea 300 mL 1   QUEtiapine (SEROQUEL) 100 MG tablet Take 1 tablet (100 mg total) by mouth daily. 30 tablet 3   QUEtiapine (SEROQUEL) 100 MG tablet Take 1 tablet (100 mg total) by mouth in the morning AND 2 tablets (200 mg total) every evening. 90 tablet 2   QUEtiapine (SEROQUEL) 200 MG tablet Take 1 tablet (200 mg total) by mouth at bedtime. 30 tablet 3   torsemide (DEMADEX) 20 MG tablet Take 1 tablet (20 mg total) by mouth 2 (two) times daily. 60 tablet 2   torsemide (DEMADEX) 20 MG tablet Take 1 tablet (20 mg total) by mouth 2 (two) times daily. 60 tablet 2     ALLERGIES Asa [aspirin], Hydrocodone, Latuda [lurasidone hcl], Magnesium-containing compounds, Prednisone, Ultram [tramadol], Codeine, Desyrel [trazodone], Hydralazine, Peanut (diagnostic), Topamax [topiramate], Sulfa antibiotics, and Tape  MEDICAL HISTORY Past Medical History:  Diagnosis Date   Abdominal pain    Acute encephalopathy 11/21/2017   Acute exacerbation of CHF (congestive heart failure) (HCC) 03/03/2022   Acute GI bleeding 03/29/2021   Acute kidney injury superimposed on chronic kidney disease (HCC) 02/18/2020   Acute metabolic encephalopathy 02/20/2020   Acute on chronic diastolic heart  failure (HCC) 16/10/9602   Acute on chronic renal failure (HCC) 06/02/2022   Aggressive behavior    Agitation 11/22/2017   Anoxic brain injury (HCC) 09/08/2016   C. Arrest due to respiratory failure and COPD exacerbation   Anxiety    Arthritis    "all over" (04/10/2016)   Asthma 10/18/2010   Binge eating disorder    Blood loss anemia 04/24/2011   CAP (community acquired pneumonia) 06/22/2015   Cardiac arrest (HCC) 09/08/2016   PEA   Carotid artery stenosis    1-39% bilateral by dopplers 11/2016   Chronic diastolic (congestive) heart failure (HCC)    Chronic kidney disease, stage 3 (HCC)    Chronic kidney disease, stage 3b (HCC) 06/06/2021   Chronic pain syndrome 06/18/2012   Chronic post-traumatic stress disorder (PTSD) 05/27/2018   Chronic respiratory failure with hypoxia and hypercapnia (HCC) 06/22/2015   TRILOGY Vent >AVAPA-ES., Vt target 200-400, Max P 30 , PS max 20 , PS min 6-10 , E Max 6, E Min 4, Rate Auto AVAPS Rate 2 (titrate for pt comfort) , bleed O2 at 5l/m continuous flow .    CKD (chronic kidney disease) 02/03/2022   Closed displaced fracture of fifth metacarpal bone 03/21/2018   Cocaine use disorder, severe, in sustained remission (HCC) 12/17/2015   Complication of anesthesia    decreased bp, decreased heart rate   COPD (chronic obstructive pulmonary disease) (HCC) 07/08/2014   Delusional disorder, persecutory type (HCC) 06/26/2021   Depression    Diabetic neuropathy (HCC) 04/24/2011   Difficulty with speech 01/24/2018   Disorder of nervous system    Drug abuse (HCC) 11/21/2017   Dyslipidemia 04/24/2011   E. coli UTI 02/20/2020   Elevated troponin 04/28/2012   Emphysema    Encephalopathy 11/21/2017   Essential hypertension 03/22/2016   Facet arthropathy, lumbar 06/12/2022   Fibula fracture 07/10/2016   Frequent falls 10/11/2017   GERD (gastroesophageal reflux disease)    GI bleed 03/30/2021   Gout 04/11/2017   Heart attack (HCC) 1980s   Hematochezia     History of blood transfusion 1994   "couldn't stop bleeding from my period"   History of drug abuse in remission (HCC) 11/28/2015   Quit in 2017   Hyperlipidemia 03/31/2021   Hyperlipidemia LDL goal <70    Hypocalcemia    Hypokalemia    Hypomagnesemia    Incontinence    Manic depression (HCC)    Morbid obesity (HCC) 10/18/2010   Obstructive sleep apnea 10/18/2010   On home oxygen therapy    "6L; 24/7" (04/10/2016)   OSA on CPAP    "wear mask sometimes" (04/10/2016)   Painless rectal bleeding 12/31/2015   Paranoid (HCC)    "sometimes; I'm on RX for it" (04/10/2016)   Periumbilical abdominal pain    Prolonged Q-T interval on ECG    Rectal bleeding 12/31/2015   Recurrent syncope 07/09/2021   Rhabdomyolysis 06/16/2021   Schizoaffective disorder, bipolar type (HCC) 04/05/2018   Seasonal allergies    Seborrheic keratoses 12/31/2013   Seizures (HCC)    "don't know what kind; last one was ~ 1 yr ago" (04/10/2016)   Sinus bradycardia    Skin ulcer of  sacrum, limited to breakdown of skin (HCC) 03/08/2022   Spondylolisthesis at L3-L4 level 06/12/2022   Stroke [facility]) 1980s   denies residual on 04/10/2016   Thrush 09/19/2013   Type 2 diabetes mellitus (HCC) 10/18/2010     SOCIAL HISTORY Social History   Socioeconomic History   Marital status: Widowed    Spouse name: Not on file   Number of children: 3   Years of education: 12th   Highest education level: 12th grade  Occupational History   Occupation: disabled    Comment: factory production  Tobacco Use   Smoking status: Former    Current packs/day: 0.00    Average packs/day: 1.5 packs/day for 39.1 years (58.6 ttl pk-yrs)    Types: Cigarettes    Start date: 03/13/1977    Quit date: 04/10/2016    Years since quitting: 6.8    Passive exposure: Past   Smokeless tobacco: Never  Vaping Use   Vaping status: Never Used  Substance and Sexual Activity   Alcohol use: No    Alcohol/week: 0.0 standard drinks of alcohol   Drug use: Not  Currently    Types: Cocaine    Comment: 04/10/2016 "last used cocaine back in November 2017"   Sexual activity: Not Currently    Birth control/protection: Surgical  Other Topics Concern   Not on file  Social History Narrative   Has 1 son, [name]   Lives with son and his boyfriend   Her house has ramps and handrails should she ever needs them.    Her mother lives down the street from her and is a good support person in addition to her son.   She drives herself, has private transportation.    Cocaine free since 02/24/16, smoke free since 04/10/16   Dialysis M-W-F   Social Drivers of Health   Financial Resource Strain: Low Risk  (07/11/2022)   Overall Financial Resource Strain (CARDIA)    Difficulty of Paying Living Expenses: Not very hard  Food Insecurity: Food Insecurity Present (11/10/2022)   Hunger Vital Sign    Worried About Running Out of Food in the Last Year: Sometimes true    Ran Out of Food in the Last Year: Sometimes true  Transportation Needs: No Transportation Needs (11/10/2022)   PRAPARE - Administrator, Civil Service (Medical): No    Lack of Transportation (Non-Medical): No  Physical Activity: Inactive (07/11/2022)   Exercise Vital Sign    Days of Exercise per Week: 0 days    Minutes of Exercise per Session: 0 min  Stress: Stress Concern Present (07/11/2022)   [employer] of Occupational Health - Occupational Stress Questionnaire    Feeling of Stress : To some extent  Social Connections: Socially Isolated (07/11/2022)   Social Connection and Isolation Panel [NHANES]    Frequency of Communication with Friends and Family: Three times a week    Frequency of Social Gatherings with Friends and Family: Three times a week    Attends Religious Services: Never    Active Member of Clubs or Organizations: No    Attends Banker Meetings: Never    Marital Status: Widowed  Intimate Partner Violence: Not At Risk (11/10/2022)   Humiliation, Afraid, Rape,  and Kick questionnaire    Fear of Current or Ex-Partner: No    Emotionally Abused: No    Physically Abused: No    Sexually Abused: No     FAMILY HISTORY Family History  Problem Relation Age of Onset   Cancer  Mother        lung   Depression Mother    Cancer Father        prostate   Depression Sister    Anxiety disorder Sister    Schizophrenia Sister    Bipolar disorder Sister    Depression Sister    Depression Brother    Heart failure Other        cousin     Review of Systems: Unable to obtain due to the patient's altered mental status  Physical Exam: Vitals:   02/14/23 0425 02/14/23 0620  BP:  117/88  Pulse: 74 76  Resp: (!) 26 19  Temp:    SpO2: 99% 98%   No intake/output data recorded. No intake or output data in the 24 hours ending 02/14/23 1049 General: Ill-appearing, lying in bed, no distress HEENT: anicteric sclera, MMM CV: normal rate, no rub, 2+ pitting edema Lungs: bilateral chest rise, normal wob Abd: soft, non-tender, non-distended Skin: no visible lesions or rashes Psych: Lethargic, unable to assess mood and affect because of altered mental status Neuro: Lethargic, will awaken to voice and pain, not interactive  Test Results Reviewed Lab Results  Component Value Date   NA 130 (L) 02/14/2023   K 3.0 (L) 02/14/2023   CL 91 (L) 02/14/2023   CO2 23 02/14/2023   BUN 39 (H) 02/14/2023   CREATININE 4.19 (H) 02/14/2023   CALCIUM 8.6 (L) 02/14/2023   ALBUMIN 3.3 (L) 11/22/2022   PHOS 4.8 (H) 11/22/2022    I have reviewed relevant outside healthcare records

## 2023-02-14 NOTE — Assessment & Plan Note (Signed)
Treated with abdominal panel and closely correlated with coughing spells versus with pneumonia.  Troponin minimally elevated nonspecific.  Will treat with cough suppressants as well as hydrocodone as needed.

## 2023-02-14 NOTE — Progress Notes (Signed)
Pharmacy Antibiotic Note  Tiffany [name] is a 63 y.o. female admitted on 02/13/2023 with pneumonia.  Pharmacy has been consulted for vancomycin dosing. Patient is ESRD MWF.   Plan: Vancomcin 1500mg  IV x1 then 1000mg  with HD Monitor levels as appropriate and clinical progression  Height: 5\' 3"  (160 cm) Weight: 88.5 kg (195 lb 1.7 oz) IBW/kg (Calculated) : 52.4  Temp (24hrs), Avg:98 F (36.7 C), Min:97.6 F (36.4 C), Max:98.7 F (37.1 C)  Recent Labs  Lab 02/13/23 1957 02/14/23 0828  WBC 8.7 8.7  CREATININE 3.84* 4.19*    Estimated Creatinine Clearance: 14.5 mL/min (A) (by C-G formula based on SCr of 4.19 mg/dL (H)).    Allergies  Allergen Reactions   Asa [Aspirin] Other (See Comments)    GI bleeds   Hydrocodone Shortness Of Breath and Hives   Latuda [Lurasidone Hcl] Anaphylaxis   Lurasidone Anaphylaxis   Magnesium-Containing Compounds Anaphylaxis and Other (See Comments)    Tolerated Ensure   Prednisone Anaphylaxis, Swelling and Other (See Comments)    Swelling of tongue, lips, throat   Ultram [Tramadol] Anaphylaxis, Swelling and Rash   Codeine Nausea And Vomiting and Rash   Desyrel [Trazodone] Other (See Comments)    Paranoia    Hydralazine Other (See Comments)    Muscle spasms   Peanut (Diagnostic) Hives and Other (See Comments)    Raw peanuts only  OK to eat peanut butter.   Peanut-Containing Drug Products Hives    Raw peanuts, but can eat peanut butter   Topamax [Topiramate] Other (See Comments)    Paranoia   Sulfa Antibiotics Itching   Tape Rash     Thank you for allowing pharmacy to be a part of this patient's care.  Toniann Fail Jontavius Rabalais 02/14/2023 1:53 PM

## 2023-02-14 NOTE — Assessment & Plan Note (Signed)
Patient is dialysis dependent, I did discuss case with Dr. Leonides Sake who will add the patient to her schedule for today.  Patient does make urine still, continue with torsemide.

## 2023-02-14 NOTE — Assessment & Plan Note (Signed)
Patient started treatment on Tamiflu Monday Wednesday Friday for 4 doses.  Continue with same.

## 2023-02-14 NOTE — Assessment & Plan Note (Signed)
Patient has chronic paraparesis apparently due to neuropathy.  Patient wheelchair dependent at home.

## 2023-02-14 NOTE — ED Provider Notes (Signed)
[facility] Emergency Department Provider Note MRN:  [id number]  Arrival date & time: 02/14/23     Chief Complaint   Cough and Generalized Body Aches   History of Present Illness   Tiffany [name] is a 63 y.o. year-old female with a history of ESRD, CHF, COPD presenting to the ED with chief complaint of cough, shortness of breath.  Cough and shortness of breath and fever for the past 2 or 3 days.  Has tested positive for the flu.  Worsening shortness of breath, here for evaluation.  Occasional chest pain and tightness.  Dialysis Monday Wednesday Friday, has not missed any sessions.  Review of Systems  A thorough review of systems was obtained and all systems are negative except as noted in the HPI and PMH.   Patient's Health History    Past Medical History:  Diagnosis Date   Abdominal pain    Acute encephalopathy 11/21/2017   Acute exacerbation of CHF (congestive heart failure) (HCC) 03/03/2022   Acute GI bleeding 03/29/2021   Acute kidney injury superimposed on chronic kidney disease (HCC) 02/18/2020   Acute metabolic encephalopathy 02/20/2020   Acute on chronic diastolic heart failure (HCC) 02/04/2022   Acute on chronic renal failure (HCC) 06/02/2022   Aggressive behavior    Agitation 11/22/2017   Anoxic brain injury (HCC) 09/08/2016   C. Arrest due to respiratory failure and COPD exacerbation   Anxiety    Arthritis    "all over" (04/10/2016)   Asthma 10/18/2010   Binge eating disorder    Blood loss anemia 04/24/2011   CAP (community acquired pneumonia) 06/22/2015   Cardiac arrest (HCC) 09/08/2016   PEA   Carotid artery stenosis    1-39% bilateral by dopplers 11/2016   Chronic diastolic (congestive) heart failure (HCC)    Chronic kidney disease, stage 3 (HCC)    Chronic kidney disease, stage 3b (HCC) 06/06/2021   Chronic pain syndrome 06/18/2012   Chronic post-traumatic stress disorder (PTSD) 05/27/2018   Chronic respiratory failure with  hypoxia and hypercapnia (HCC) 06/22/2015   TRILOGY Vent >AVAPA-ES., Vt target 200-400, Max P 30 , PS max 20 , PS min 6-10 , E Max 6, E Min 4, Rate Auto AVAPS Rate 2 (titrate for pt comfort) , bleed O2 at 5l/m continuous flow .    CKD (chronic kidney disease) 02/03/2022   Closed displaced fracture of fifth metacarpal bone 03/21/2018   Cocaine use disorder, severe, in sustained remission (HCC) 12/17/2015   Complication of anesthesia    decreased bp, decreased heart rate   COPD (chronic obstructive pulmonary disease) (HCC) 07/08/2014   Delusional disorder, persecutory type (HCC) 06/26/2021   Depression    Diabetic neuropathy (HCC) 04/24/2011   Difficulty with speech 01/24/2018   Disorder of nervous system    Drug abuse (HCC) 11/21/2017   Dyslipidemia 04/24/2011   E. coli UTI 02/20/2020   Elevated troponin 04/28/2012   Emphysema    Encephalopathy 11/21/2017   Essential hypertension 03/22/2016   Facet arthropathy, lumbar 06/12/2022   Fibula fracture 07/10/2016   Frequent falls 10/11/2017   GERD (gastroesophageal reflux disease)    GI bleed 03/30/2021   Gout 04/11/2017   Heart attack (HCC) 1980s   Hematochezia    History of blood transfusion 1994   "couldn't stop bleeding from my period"   History of drug abuse in remission (HCC) 11/28/2015   Quit in 2017   Hyperlipidemia 03/31/2021   Hyperlipidemia LDL goal <70    Hypocalcemia  Hypokalemia    Hypomagnesemia    Incontinence    Manic depression (HCC)    Morbid obesity (HCC) 10/18/2010   Obstructive sleep apnea 10/18/2010   On home oxygen therapy    "6L; 24/7" (04/10/2016)   OSA on CPAP    "wear mask sometimes" (04/10/2016)   Painless rectal bleeding 12/31/2015   Paranoid (HCC)    "sometimes; I'm on RX for it" (04/10/2016)   Periumbilical abdominal pain    Prolonged Q-T interval on ECG    Rectal bleeding 12/31/2015   Recurrent syncope 07/09/2021   Rhabdomyolysis 06/16/2021   Schizoaffective disorder, bipolar type (HCC)  04/05/2018   Seasonal allergies    Seborrheic keratoses 12/31/2013   Seizures (HCC)    "don't know what kind; last one was ~ 1 yr ago" (04/10/2016)   Sinus bradycardia    Skin ulcer of sacrum, limited to breakdown of skin (HCC) 03/08/2022   Spondylolisthesis at L3-L4 level 06/12/2022   Stroke (HCC) 1980s   denies residual on 04/10/2016   Thrush 09/19/2013   Type 2 diabetes mellitus (HCC) 10/18/2010    Past Surgical History:  Procedure Laterality Date   A/V FISTULAGRAM N/A 02/01/2023   Procedure: A/V Fistulagram;  Surgeon: Tyler Pita, MD;  [facility] IN[facility];  Service: Cardiovascular;  Laterality: N/A;   AV FISTULA PLACEMENT Left 12/21/2022   Procedure: INSERTION OF LEFT ARM ARTERIOVENOUS (AV) GORE-TEX GRAFT;  Surgeon: Leonie Douglas, MD;  Location: [facility] OR;  Service: Vascular;  Laterality: Left;   CESAREAN SECTION  1997   COLONOSCOPY WITH PROPOFOL N/A 04/01/2021   Procedure: COLONOSCOPY WITH PROPOFOL;  Surgeon: Jeani Hawking, MD;  Location: [facility] ENDOSCOPY;  Service: Gastroenterology;  Laterality: N/A;  Rectal bleeding with drop in hemoglobin to 7.2 g/dL   HERNIA REPAIR     IR CHOLANGIOGRAM EXISTING TUBE  07/20/2016   IR PERC CHOLECYSTOSTOMY  05/10/2016   IR RADIOLOGIST EVAL & MGMT  06/08/2016   IR RADIOLOGIST EVAL & MGMT  06/29/2016   IR SINUS/FIST TUBE CHK-NON GI  07/12/2016   RIGHT/LEFT HEART CATH AND CORONARY ANGIOGRAPHY N/A 06/19/2017   Procedure: RIGHT/LEFT HEART CATH AND CORONARY ANGIOGRAPHY;  Surgeon: Dolores Patty, MD;  [facility];  Service: Cardiovascular;  Laterality: N/A;   TIBIA IM NAIL INSERTION Right 07/12/2016   Procedure: INTRAMEDULLARY (IM) NAIL RIGHT TIBIA;  Surgeon: Tarry Kos, MD;  Location: [facility] OR;  Service: Orthopedics;  Laterality: Right;   UMBILICAL HERNIA REPAIR  ~ 1963   "that's why I don't have a belly button"   VAGINAL HYSTERECTOMY      Family History  Problem Relation Age of Onset   Cancer Mother        lung    Depression Mother    Cancer Father        prostate   Depression Sister    Anxiety disorder Sister    Schizophrenia Sister    Bipolar disorder Sister    Depression Sister    Depression Brother    Heart failure Other        cousin    Social History   Socioeconomic History   Marital status: Widowed    Spouse name: Not on file   Number of children: 3   Years of education: 12th   Highest education level: 12th grade  Occupational History   Occupation: disabled    Comment: factory production  Tobacco Use   Smoking status: Former    Current packs/day: 0.00  Average packs/day: 1.5 packs/day for 39.1 years (58.6 ttl pk-yrs)    Types: Cigarettes    Start date: 03/13/1977    Quit date: 04/10/2016    Years since quitting: 6.8    Passive exposure: Past   Smokeless tobacco: Never  Vaping Use   Vaping status: Never Used  Substance and Sexual Activity   Alcohol use: No    Alcohol/week: 0.0 standard drinks of alcohol   Drug use: Not Currently    Types: Cocaine    Comment: 04/10/2016 "last used cocaine back in November 2017"   Sexual activity: Not Currently    Birth control/protection: Surgical  Other Topics Concern   Not on file  Social History Narrative   Has 1 son, [name]   Lives with son and his boyfriend   Her house has ramps and handrails should she ever needs them.    Her mother lives down the street from her and is a good support person in addition to her son.   She drives herself, has private transportation.    Cocaine free since 02/24/16, smoke free since 04/10/16   Dialysis M-W-F   Social Drivers of Health   Financial Resource Strain: Low Risk  (07/11/2022)   Overall Financial Resource Strain (CARDIA)    Difficulty of Paying Living Expenses: Not very hard  Food Insecurity: Food Insecurity Present (11/10/2022)   Hunger Vital Sign    Worried About Running Out of Food in the Last Year: Sometimes true    Ran Out of Food in the Last Year: Sometimes true  Transportation  Needs: No Transportation Needs (11/10/2022)   PRAPARE - Administrator, Civil Service (Medical): No    Lack of Transportation (Non-Medical): No  Physical Activity: Inactive (07/11/2022)   Exercise Vital Sign    Days of Exercise per Week: 0 days    Minutes of Exercise per Session: 0 min  Stress: Stress Concern Present (07/11/2022)   [employer] of Occupational Health - Occupational Stress Questionnaire    Feeling of Stress : To some extent  Social Connections: Socially Isolated (07/11/2022)   Social Connection and Isolation Panel [NHANES]    Frequency of Communication with Friends and Family: Three times a week    Frequency of Social Gatherings with Friends and Family: Three times a week    Attends Religious Services: Never    Active Member of Clubs or Organizations: No    Attends Banker Meetings: Never    Marital Status: Widowed  Intimate Partner Violence: Not At Risk (11/10/2022)   Humiliation, Afraid, Rape, and Kick questionnaire    Fear of Current or Ex-Partner: No    Emotionally Abused: No    Physically Abused: No    Sexually Abused: No     Physical Exam   Vitals:   02/13/23 1917 02/13/23 2222  BP: 110/80 (!) 139/90  Pulse: 70 71  Resp: 16 18  Temp: 98.7 F (37.1 C) 98.1 F (36.7 C)  SpO2: 100% 98%    CONSTITUTIONAL: Chronically ill-appearing, NAD NEURO/PSYCH:  Alert and oriented x 3, no focal deficits EYES:  eyes equal and reactive ENT/NECK:  no LAD, no JVD CARDIO: Regular rate, well-perfused, normal S1 and S2 PULM:  CTAB no wheezing or rhonchi GI/GU:  non-distended, non-tender MSK/SPINE:  No gross deformities, no edema SKIN:  no rash, atraumatic   *Additional and/or pertinent findings included in MDM below  Diagnostic and Interventional Summary    EKG Interpretation Date/Time:  Tuesday February 13 2023 19:22:44 EST  Ventricular Rate:  67 PR Interval:  158 QRS Duration:  86 QT Interval:  446 QTC Calculation: 471 R  Axis:   -1  Text Interpretation: Normal sinus rhythm Normal ECG When compared with ECG of 07-Nov-2022 13:01, PREVIOUS ECG IS PRESENT Confirmed by Edwin Dada ([id number]) on 02/13/2023 7:41:47 PM       Labs Reviewed  CBC WITH DIFFERENTIAL/PLATELET - Abnormal; Notable for the following components:      Result Value   RBC 3.40 (*)    Hemoglobin 9.9 (*)    HCT 31.1 (*)    All other components within normal limits  BASIC METABOLIC PANEL - Abnormal; Notable for the following components:   Sodium 130 (*)    Potassium 2.9 (*)    Chloride 95 (*)    CO2 19 (*)    BUN 35 (*)    Creatinine, Ser 3.84 (*)    Calcium 8.7 (*)    GFR, Estimated 13 (*)    Anion gap 16 (*)    All other components within normal limits  TROPONIN I (HIGH SENSITIVITY) - Abnormal; Notable for the following components:   Troponin I (High Sensitivity) 20 (*)    All other components within normal limits  TROPONIN I (HIGH SENSITIVITY)    No orders to display    Medications  ipratropium-albuterol (DUONEB) 0.5-2.5 (3) MG/3ML nebulizer solution 3 mL (has no administration in time range)  acetaminophen (TYLENOL) tablet 1,000 mg (has no administration in time range)  ipratropium-albuterol (DUONEB) 0.5-2.5 (3) MG/3ML nebulizer solution 3 mL (3 mLs Nebulization Given 02/13/23 1954)  acetaminophen (TYLENOL) tablet 650 mg (650 mg Oral Given 02/13/23 1954)     Procedures  /  Critical Care .Critical Care  Performed by: Sabas Sous, MD Authorized by: Sabas Sous, MD   Critical care provider statement:    Critical care time (minutes):  32   Critical care was necessary to treat or prevent imminent or life-threatening deterioration of the following conditions:  Respiratory failure   Critical care was time spent personally by me on the following activities:  Development of treatment plan with patient or surrogate, discussions with consultants, evaluation of patient's response to treatment, examination of patient, ordering and  review of laboratory studies, ordering and review of radiographic studies, ordering and performing treatments and interventions, pulse oximetry, re-evaluation of patient's condition and review of old charts   ED Course and Medical Decision Making  Initial Impression and Ddx Patient presenting with hypoxic respiratory failure requiring 4 L nasal cannula.  Known flu positive.  Also with CHF, ESRD and so could have a fluid overload component.  Pneumonia, pneumothorax also considered.  Reassuring vital signs, comfortable while on 4 L nasal cannula.  Past medical/surgical history that increases complexity of ED encounter: ESRD, CHF  Interpretation of Diagnostics I personally reviewed the EKG and my interpretation is as follows: Sinus rhythm  Labs reveal minimally elevated troponin, hypokalemia  Patient Reassessment and Ultimate Disposition/Management     Plan is for hospitalist admission, will also call nephrology for dialysis.  Patient management required discussion with the following services or consulting groups:  Hospitalist Service and Nephrology  Complexity of Problems Addressed Acute illness or injury that poses threat of life of bodily function  Additional Data Reviewed and Analyzed Further history obtained from: Prior labs/imaging results  Additional Factors Impacting ED Encounter Risk Consideration of hospitalization  Elmer Sow. Pilar Plate, MD [facility] [email or web address]  Final Clinical Impressions(s) / ED Diagnoses  ICD-10-CM   1. Influenza  J11.1     2. Acute respiratory failure with hypoxia (HCC)  J96.01       ED Discharge Orders     None        Discharge Instructions Discussed with and Provided to Patient:   Discharge Instructions   None      Sabas Sous, MD 02/14/23 [phone]

## 2023-02-14 NOTE — ED Notes (Signed)
Attempted IV x2 patient refused another attempt with another nurse.

## 2023-02-14 NOTE — Assessment & Plan Note (Signed)
Patient typically on not any supplementary oxygen at home.  Patient diagnosed with influenza A approximately 4 days ago.  Now returns with increasing chest pain with coughing, found to be requiring 4 L/min of supplementary oxygen.  Chest x-ray shows new infiltrate of the left lower lung field.  Concern for bacterial pneumonia on top of recent viral pneumonia.  Will start treatment with vancomycin plus ceftriaxone plus azithromycin please.  Will draw blood cultures.  Patient will require admission to progressive unit.  Given her immunocompromised status.

## 2023-02-14 NOTE — Assessment & Plan Note (Signed)
Continue Cymbalta Lexapro Remeron and Seroquel.

## 2023-02-15 ENCOUNTER — Encounter: Payer: Self-pay | Admitting: Physician Assistant

## 2023-02-15 ENCOUNTER — Inpatient Hospital Stay (HOSPITAL_COMMUNITY): Payer: HMO

## 2023-02-15 ENCOUNTER — Ambulatory Visit: Payer: HMO | Admitting: Physician Assistant

## 2023-02-15 DIAGNOSIS — Z91199 Patient's noncompliance with other medical treatment and regimen due to unspecified reason: Secondary | ICD-10-CM

## 2023-02-15 DIAGNOSIS — J9601 Acute respiratory failure with hypoxia: Secondary | ICD-10-CM | POA: Diagnosis not present

## 2023-02-15 LAB — BASIC METABOLIC PANEL
Anion gap: 11 (ref 5–15)
BUN: 19 mg/dL (ref 8–23)
CO2: 28 mmol/L (ref 22–32)
Calcium: 8.5 mg/dL — ABNORMAL LOW (ref 8.9–10.3)
Chloride: 92 mmol/L — ABNORMAL LOW (ref 98–111)
Creatinine, Ser: 3.03 mg/dL — ABNORMAL HIGH (ref 0.44–1.00)
GFR, Estimated: 17 mL/min — ABNORMAL LOW (ref >60)
Glucose, Bld: 99 mg/dL (ref 70–99)
Potassium: 3.4 mmol/L — ABNORMAL LOW (ref 3.5–5.1)
Sodium: 131 mmol/L — ABNORMAL LOW (ref 135–145)

## 2023-02-15 LAB — CBC
HCT: 32.1 % — ABNORMAL LOW (ref 36.0–46.0)
Hemoglobin: 10.1 g/dL — ABNORMAL LOW (ref 12.0–15.0)
MCH: 29.1 pg (ref 26.0–34.0)
MCHC: 31.5 g/dL (ref 30.0–36.0)
MCV: 92.5 fL (ref 80.0–100.0)
Platelets: 354 10*3/uL (ref 150–400)
RBC: 3.47 MIL/uL — ABNORMAL LOW (ref 3.87–5.11)
RDW: 15.2 % (ref 11.5–15.5)
WBC: 9.2 10*3/uL (ref 4.0–10.5)
nRBC: 0 % (ref 0.0–0.2)

## 2023-02-15 LAB — APTT: aPTT: 38 s — ABNORMAL HIGH (ref 24–36)

## 2023-02-15 LAB — HEPATITIS B SURFACE ANTIBODY, QUANTITATIVE: Hep B S AB Quant (Post): 9112 m[IU]/mL

## 2023-02-15 LAB — PROTIME-INR
INR: 1.2 (ref 0.8–1.2)
Prothrombin Time: 15.2 s (ref 11.4–15.2)

## 2023-02-15 MED ORDER — DOXYCYCLINE HYCLATE 100 MG PO TABS
100.0000 mg | ORAL_TABLET | Freq: Two times a day (BID) | ORAL | Status: DC
  Administered 2023-02-15 – 2023-02-20 (×11): 100 mg via ORAL
  Filled 2023-02-15 (×11): qty 1

## 2023-02-15 MED ORDER — LIDOCAINE 5 % EX PTCH
1.0000 | MEDICATED_PATCH | CUTANEOUS | Status: DC
  Administered 2023-02-15 – 2023-02-19 (×5): 1 via TRANSDERMAL
  Filled 2023-02-15 (×4): qty 1

## 2023-02-15 NOTE — Evaluation (Signed)
Physical Therapy Evaluation Patient Details Name: Tiffany [name] MRN: [id number] DOB: July 14, 1960 Today's Date: 02/15/2023  History of Present Illness  63 y.o. female admitted 02/13/23 with cough, fever, chest and abdominal pain.  Noted positive for influenza A and CXR positive for L lower lobe infiltrate concern for bacterial pneumonia.  PMH ESRD on HD MWF, obesity, OSA, HTN, CHF, COPD, DM2, CKD, PTSD, gout, seizures, CVA, anoxic brain injury, spinal stenosis, peripheral neuropathy, schizophrenia/schizoaffective disorder, CAD s/p MI x2, HLD, multiple falls, and drug abuse (cocaine).  Clinical Impression  Patient presents with decreased mobility due to generalized weakness, pain, decreased activity tolerance.  She normally lives alone with some intermittent assist and frequent falls.  Able to transfer to and from bed with CGA and set up.  Discussed importance of set up and need for taking time for safety and fall prevention.  Also worked on [employer] and though pt coughing not able to bring up sputum.  Maintained on 4L O2 throughout and pt tolerating mobility though some abdominal pain with coughing.  Patient eager to maintain strength to be able to return home.  Feel she should progress as not far from her baseline, and recommend resume HHPT at d/c.         If plan is discharge home, recommend the following: A little help with walking and/or transfers;Assist for transportation   Can travel by private vehicle        Equipment Recommendations None recommended by PT  Recommendations for Other Services       Functional Status Assessment Patient has had a recent decline in their functional status and demonstrates the ability to make significant improvements in function in a reasonable and predictable amount of time.     Precautions / Restrictions Precautions Precautions: Fall Precaution Comments: reports frequent falls      Mobility  Bed Mobility                General bed mobility comments: in recliner    Transfers Overall transfer level: Needs assistance   Transfers: Bed to chair/wheelchair/BSC            Lateral/Scoot Transfers: Contact guard assist General transfer comment: from recliner to bed, then back to recliner with drop arm    Ambulation/Gait                  Stairs            Wheelchair Mobility     Tilt Bed    Modified Rankin (Stroke Patients Only)       Balance Overall balance assessment: Needs assistance Sitting-balance support: Feet supported, No upper extremity supported Sitting balance-Leahy Scale: Good Sitting balance - Comments: on EOB and able to scoot dynamically with A for safety/lines                                     Pertinent Vitals/Pain Pain Assessment Pain Assessment: Faces Faces Pain Scale: Hurts even more Pain Location: abdomen from coughing Pain Descriptors / Indicators: Sore Pain Intervention(s): Monitored during session, Repositioned    Home Living Family/patient expects to be discharged to:: Private residence Living Arrangements: Alone   Type of Home: House Home Access: Stairs to enter;Ramped entrance       Home Layout: One level Home Equipment: BSC/3in1;Hospital bed;Wheelchair - Surveyor, quantity (2 wheels) Additional Comments: denies having an aide, but states she has someone that helps, her;  unclear of how many days per week. states "they are there when I need them"    Prior Function               Mobility Comments: transfers to [facility] (drop arm) and power chair on her own scooting, working with HHPT on standing at sink       Extremity/Trunk Assessment   Upper Extremity Assessment Upper Extremity Assessment: Overall WFL for tasks assessed    Lower Extremity Assessment Lower Extremity Assessment: RLE deficits/detail;LLE deficits/detail RLE Deficits / Details: limited ankle DF with neuropathy; knee extension strength 4-/5 RLE  Sensation: history of peripheral neuropathy LLE Deficits / Details: limited ankle DF with neuropathy; knee extension strength 4-/5 LLE Sensation: history of peripheral neuropathy    Cervical / Trunk Assessment Cervical / Trunk Assessment: Kyphotic  Communication   Communication Communication: No apparent difficulties  Cognition Arousal: Alert Behavior During Therapy: WFL for tasks assessed/performed Overall Cognitive Status: History of cognitive impairments - at baseline                                          General Comments General comments (skin integrity, edema, etc.): on 4L O2 with SpO2 90% or higher, other VSS    Exercises Other Exercises Other Exercises: incentive spirometer x 3 with up to 750 ml; coughing and c/o abdominal pain   Assessment/Plan    PT Assessment Patient needs continued PT services  PT Problem List Decreased activity tolerance;Decreased mobility;Cardiopulmonary status limiting activity;Decreased strength       PT Treatment Interventions DME instruction;Functional mobility training;Therapeutic activities;Therapeutic exercise;Patient/family education    PT Goals (Current goals can be found in the Care Plan section)  Acute Rehab PT Goals Patient Stated Goal: to return home PT Goal Formulation: With patient Time For Goal Achievement: 03/01/23 Potential to Achieve Goals: Good    Frequency Min 1X/week     Co-evaluation               AM-PAC PT "6 Clicks" Mobility  Outcome Measure Help needed turning from your back to your side while in a flat bed without using bedrails?: A Little Help needed moving from lying on your back to sitting on the side of a flat bed without using bedrails?: A Little Help needed moving to and from a bed to a chair (including a wheelchair)?: A Little Help needed standing up from a chair using your arms (e.g., wheelchair or bedside chair)?: A Little Help needed to walk in hospital room?: Total Help  needed climbing 3-5 steps with a railing? : Total 6 Click Score: 14    End of Session Equipment Utilized During Treatment: Oxygen Activity Tolerance: Patient limited by fatigue Patient left: in chair;with call bell/phone within reach;with chair alarm set   PT Visit Diagnosis: Muscle weakness (generalized) (M62.81);History of falling (Z91.81);Other abnormalities of gait and mobility (R26.89)    Time: 1610-9604 PT Time Calculation (min) (ACUTE ONLY): 31 min   Charges:   PT Evaluation $PT Eval Moderate Complexity: 1 Mod PT Treatments $Therapeutic Activity: 8-22 mins PT General Charges $$ ACUTE PT VISIT: 1 Visit         Sheran Lawless, PT Acute Rehabilitation Services Office:[phone] 02/15/2023   Elray Mcgregor 02/15/2023, 1:05 PM

## 2023-02-15 NOTE — Progress Notes (Signed)
Pt was unable to complete her SDMV as he is currently hospitalized.  Darcella Gasman Shalom Mcguiness, PA-C

## 2023-02-15 NOTE — Progress Notes (Signed)
PROGRESS NOTE                                                                                                                                                                                                             Patient Demographics:    Tiffany [name], is a 63 y.o. female, DOB - 08/06/1960, UEA:[id number]  Outpatient Primary MD for the patient is [name], MD    LOS - 1  Admit date - 02/13/2023    Chief Complaint  Patient presents with   Cough   Generalized Body Aches       Brief Narrative (HPI from H&P)    63 y.o. female with medical history significant of end-stage renal disease on dialysis Monday Wednesday Friday.  Patient reports being typically wheelchair-bound due to paraparesis due to neuropathy.  Patient seems to have been in her usual state of health till around January 23, 6 days ago when she developed a new onset cough.  Patient was actually evaluated in the ER on January 25.  Patient was found to have fever 101 F at the time.  Patient was diagnosed with influenza A and discharged home at the time in reasonable condition.  Patient returned yesterday with complaints of chest pain with coughing, as well as abdominal pain during coughing spells, diagnosed with acute hypoxic respiratory failure due to influenza A pneumonia there was also concern for lung mass and she was admitted for further care.   Subjective:    [name] today has, No headache, No chest pain, No abdominal pain - No Nausea, No new weakness tingling or numbness, mild cough and SOB   Assessment  & Plan :    Acute hypoxic respiratory failure (HCC) due to influenza A pneumonia.  Also question of left lung mass.  Could have postobstructive pneumonia due to that. He has been placed on Tamiflu with good improvement, continue supplemental oxygen, at home she uses 2 to 3 L nasal cannula oxygen intermittently, use I-S and flutter valve for  pulmonary toiletry, advance activity, titrate down oxygen, check CT chest for lung mass.  De-escalate antibiotics to doxycycline and monitor cultures.  Schizophrenia (HCC) Continue Cymbalta Lexapro Remeron and Seroquel.  Frequent falls - Patient has chronic paraparesis apparently due to neuropathy.  Patient wheelchair dependent at home.  Chest pain due to cough, supportive care and  monitor.  Stable EKG, pain currently resolved with supportive care.  Single troponin borderline, of no significance clinically.  Subsequent troponins stable.  ESRD - on MWF schedule.  Nephrology on board.  Obesity: BMI of 34.8.  Follow-up with PCP for weight loss.       Condition - Extremely Guarded  Family Communication  : Called daughter Tiffany [name] [phone]  on 02/15/2023 at 10:08 AM and message left  Code Status :  Full  Consults  :  Renal  PUD Prophylaxis : PPI   Procedures  :      CT -       Disposition Plan  :    Status is: Inpatient   DVT Prophylaxis  :    heparin injection 5,000 Units Start: 02/15/23 0600 SCDs Start: 02/14/23 0744    Lab Results  Component Value Date   PLT 354 02/15/2023    Diet :  Diet Order             Diet regular Room service appropriate? Yes; Fluid consistency: Thin; Fluid restriction: 1800 mL Fluid  Diet effective now                    Inpatient Medications  Scheduled Meds:  atorvastatin  20 mg Oral Daily   calcitRIOL  0.25 mcg Oral Q M,W,F   carvedilol  3.125 mg Oral BID   Chlorhexidine Gluconate Cloth  6 each Topical Q0600   dextromethorphan-guaiFENesin  1 tablet Oral BID   doxycycline  100 mg Oral Q12H   DULoxetine  30 mg Oral Daily   escitalopram  20 mg Oral Daily   ezetimibe  10 mg Oral Daily   fluticasone furoate-vilanterol  1 puff Inhalation Daily   And   umeclidinium bromide  1 puff Inhalation Daily   heparin  5,000 Units Subcutaneous Q8H   mirtazapine  15 mg Oral QHS   oseltamivir  30 mg Oral Q M,W,F-2000   pantoprazole  40  mg Oral Daily   QUEtiapine  100 mg Oral Daily   QUEtiapine  200 mg Oral QHS   sodium chloride flush  3 mL Intravenous Q12H   torsemide  60 mg Oral BID   Continuous Infusions:   PRN Meds:.acetaminophen **OR** acetaminophen, albuterol, alteplase, heparin, HYDROcodone-acetaminophen, lidocaine (PF), lidocaine-prilocaine, pentafluoroprop-tetrafluoroeth, polyethylene glycol     Objective:   Vitals:   02/15/23 0340 02/15/23 0400 02/15/23 0604 02/15/23 0911  BP: (!) 142/93 (!) 139/96    Pulse: 78 74    Resp: 20 16    Temp: 98 F (36.7 C)     TempSrc: Oral     SpO2: 94% 94%  93%  Weight:   89.3 kg   Height:        Wt Readings from Last 3 Encounters:  02/15/23 89.3 kg  02/10/23 88.5 kg  01/30/23 88.5 kg     Intake/Output Summary (Last 24 hours) at 02/15/2023 1011 Last data filed at 02/15/2023 0600 Gross per 24 hour  Intake 830 ml  Output 4300 ml  Net -3470 ml     Physical Exam  Awake Alert, No new F.N deficits, Normal affect [place].AT,PERRAL Supple Neck, No JVD,   Symmetrical Chest wall movement, Good air movement bilaterally, CTAB RRR,No Gallops,Rubs or new Murmurs,  +ve B.Sounds, Abd Soft, No tenderness,   No Cyanosis, Clubbing or edema        Data Review:    Recent Labs  Lab 02/13/23 1957 02/14/23 0828 02/14/23 1911 02/15/23 0517  WBC 8.7  8.7 9.4 9.2  HGB 9.9* 10.1* 11.2* 10.1*  HCT 31.1* 31.6* 35.0* 32.1*  PLT 340 350 386 354  MCV 91.5 91.3 91.4 92.5  MCH 29.1 29.2 29.2 29.1  MCHC 31.8 32.0 32.0 31.5  RDW 15.2 15.0 15.1 15.2  LYMPHSABS 1.5  --   --   --   MONOABS 0.8  --   --   --   EOSABS 0.2  --   --   --   BASOSABS 0.0  --   --   --     Recent Labs  Lab 02/13/23 1957 02/14/23 0828 02/15/23 0517  NA 130* 130* 131*  K 2.9* 3.0* 3.4*  CL 95* 91* 92*  CO2 19* 23 28  ANIONGAP 16* 16* 11  GLUCOSE 83 95 99  BUN 35* 39* 19  CREATININE 3.84* 4.19* 3.03*  AST  --  29  --   ALT  --  14  --   ALKPHOS  --  110  --   BILITOT  --  0.6  --    ALBUMIN  --  2.9*  --   INR  --   --  1.2  PHOS  --  3.5  --   CALCIUM 8.7* 8.6* 8.5*      Recent Labs  Lab 02/13/23 1957 02/14/23 0828 02/15/23 0517  INR  --   --  1.2  CALCIUM 8.7* 8.6* 8.5*    --------------------------------------------------------------------------------------------------------------- Lab Results  Component Value Date   CHOL 151 11/14/2022   HDL 65 11/14/2022   LDLCALC 66 11/14/2022   LDLDIRECT 52 04/05/2012   TRIG 100 11/14/2022   CHOLHDL 2.3 11/14/2022    Lab Results  Component Value Date   HGBA1C 5.0 01/05/2022     Micro Results Recent Results (from the past 240 hours)  Resp panel by RT-PCR (RSV, Flu A&B, Covid) Anterior Nasal Swab     Status: Abnormal   Collection Time: 02/10/23  3:30 AM   Specimen: Anterior Nasal Swab  Result Value Ref Range Status   SARS Coronavirus 2 by RT PCR NEGATIVE NEGATIVE Final   Influenza A by PCR POSITIVE (A) NEGATIVE Final   Influenza B by PCR NEGATIVE NEGATIVE Final    Comment: (NOTE) The Xpert Xpress SARS-CoV-2/FLU/RSV plus assay is intended as an aid in the diagnosis of influenza from Nasopharyngeal swab specimens and should not be used as a sole basis for treatment. Nasal washings and aspirates are unacceptable for Xpert Xpress SARS-CoV-2/FLU/RSV testing.  Fact Sheet for Patients: BloggerCourse.com  Fact Sheet for Healthcare Providers: SeriousBroker.it  This test is not yet approved or cleared by the [place] FDA and has been authorized for detection and/or diagnosis of SARS-CoV-2 by FDA under an Emergency Use Authorization (EUA). This EUA will remain in effect (meaning this test can be used) for the duration of the COVID-19 declaration under Section 564(b)(1) of the Act, 21 U.S.C. section 360bbb-3(b)(1), unless the authorization is terminated or revoked.     Resp Syncytial Virus by PCR NEGATIVE NEGATIVE Final    Comment: (NOTE) Fact Sheet  for Patients: BloggerCourse.com  Fact Sheet for Healthcare Providers: SeriousBroker.it  This test is not yet approved or cleared by the [place] FDA and has been authorized for detection and/or diagnosis of SARS-CoV-2 by FDA under an Emergency Use Authorization (EUA). This EUA will remain in effect (meaning this test can be used) for the duration of the COVID-19 declaration under Section 564(b)(1) of the Act, 21 U.S.C. section 360bbb-3(b)(1), unless the  authorization is terminated or revoked.  Performed at [facility], [address], [place], [place] [address]   MRSA Next Gen by PCR, Nasal     Status: None   Collection Time: 02/14/23 11:46 AM   Specimen: Nasal Mucosa; Nasal Swab  Result Value Ref Range Status   MRSA by PCR Next Gen NOT DETECTED NOT DETECTED Final    Comment: (NOTE) The GeneXpert MRSA Assay (FDA approved for NASAL specimens only), is one component of a comprehensive MRSA colonization surveillance program. It is not intended to diagnose MRSA infection nor to guide or monitor treatment for MRSA infections. Test performance is not FDA approved in patients less than 79 years old. Performed at [facility], [address], [place], [place] [address]   Culture, blood (Routine X 2) w Reflex to ID Panel     Status: None (Preliminary result)   Collection Time: 02/14/23  7:10 PM   Specimen: BLOOD RIGHT HAND  Result Value Ref Range Status   Specimen Description BLOOD RIGHT HAND  Final   Special Requests   Final    BOTTLES DRAWN AEROBIC AND ANAEROBIC Blood Culture results may not be optimal due to an inadequate volume of blood received in culture bottles   Culture   Final    NO GROWTH < 12 HOURS Performed at [facility], [address], [place], [place] [address]    Report Status PENDING  Incomplete  Culture, blood (Routine X 2) w Reflex to ID Panel     Status: None (Preliminary result)    Collection Time: 02/14/23  7:11 PM   Specimen: BLOOD RIGHT ARM  Result Value Ref Range Status   Specimen Description BLOOD RIGHT ARM  Final   Special Requests   Final    BOTTLES DRAWN AEROBIC AND ANAEROBIC Blood Culture results may not be optimal due to an inadequate volume of blood received in culture bottles   Culture   Final    NO GROWTH < 12 HOURS Performed at [facility], [address] [place], [place] [address]    Report Status PENDING  Incomplete    Radiology Reports DG CHEST PORT 1 VIEW Result Date: 02/14/2023 CLINICAL DATA:  Shortness of breath.  Left-sided chest pain. EXAM: PORTABLE CHEST 1 VIEW COMPARISON:  11/07/2022. FINDINGS: There is new heterogeneous, at least 6 x 7.5 cm masslike consolidation overlying the left mid lower lung zones. Correlate clinically. Further evaluation with chest CT scan is recommended. Bilateral lungs are otherwise clear. No pulmonary edema. Bilateral costophrenic angles are clear. No pneumothorax. Stable cardio-mediastinal silhouette. No acute osseous abnormalities. The soft tissues are within normal limits. Right IJ hemodialysis catheter again seen with its tip overlying the cavoatrial junction region. IMPRESSION: There is a heterogeneous masslike consolidation overlying the left mid lower lung zones. Further evaluation with chest CT scan is recommended. Electronically Signed   By: Jules Schick M.D.   On: 02/14/2023 11:44      Signature  -   Susa Raring M.D on 02/15/2023 at 10:11 AM   -  To page go to [email or web address]

## 2023-02-15 NOTE — Progress Notes (Signed)
Pt receives out-pt HD at [facility] on MWF. Will assist as needed.   Olivia Canter Renal Navigator [phone]

## 2023-02-15 NOTE — Evaluation (Signed)
Clinical/Bedside Swallow Evaluation Patient Details  Name: Tiffany [name] MRN: [id number] Date of Birth: Feb 28, 1960  Today's Date: 02/15/2023 Time: SLP Start Time (ACUTE ONLY): 0902 SLP Stop Time (ACUTE ONLY): 0911 SLP Time Calculation (min) (ACUTE ONLY): 9 min  Past Medical History:  Past Medical History:  Diagnosis Date   Abdominal pain    Acute encephalopathy 11/21/2017   Acute exacerbation of CHF (congestive heart failure) (HCC) 03/03/2022   Acute GI bleeding 03/29/2021   Acute kidney injury superimposed on chronic kidney disease (HCC) 02/18/2020   Acute metabolic encephalopathy 02/20/2020   Acute on chronic diastolic heart failure (HCC) 02/04/2022   Acute on chronic renal failure (HCC) 06/02/2022   Aggressive behavior    Agitation 11/22/2017   Anoxic brain injury (HCC) 09/08/2016   C. Arrest due to respiratory failure and COPD exacerbation   Anxiety    Arthritis    "all over" (04/10/2016)   Asthma 10/18/2010   Binge eating disorder    Blood loss anemia 04/24/2011   CAP (community acquired pneumonia) 06/22/2015   Cardiac arrest (HCC) 09/08/2016   PEA   Carotid artery stenosis    1-39% bilateral by dopplers 11/2016   Chronic diastolic (congestive) heart failure (HCC)    Chronic kidney disease, stage 3 (HCC)    Chronic kidney disease, stage 3b (HCC) 06/06/2021   Chronic pain syndrome 06/18/2012   Chronic post-traumatic stress disorder (PTSD) 05/27/2018   Chronic respiratory failure with hypoxia and hypercapnia (HCC) 06/22/2015   TRILOGY Vent >AVAPA-ES., Vt target 200-400, Max P 30 , PS max 20 , PS min 6-10 , E Max 6, E Min 4, Rate Auto AVAPS Rate 2 (titrate for pt comfort) , bleed O2 at 5l/m continuous flow .    CKD (chronic kidney disease) 02/03/2022   Closed displaced fracture of fifth metacarpal bone 03/21/2018   Cocaine use disorder, severe, in sustained remission (HCC) 12/17/2015   Complication of anesthesia    decreased bp, decreased heart rate   COPD (chronic  obstructive pulmonary disease) (HCC) 07/08/2014   Delusional disorder, persecutory type (HCC) 06/26/2021   Depression    Diabetic neuropathy (HCC) 04/24/2011   Difficulty with speech 01/24/2018   Disorder of nervous system    Drug abuse (HCC) 11/21/2017   Dyslipidemia 04/24/2011   E. coli UTI 02/20/2020   Elevated troponin 04/28/2012   Emphysema    Encephalopathy 11/21/2017   Essential hypertension 03/22/2016   Facet arthropathy, lumbar 06/12/2022   Fibula fracture 07/10/2016   Frequent falls 10/11/2017   GERD (gastroesophageal reflux disease)    GI bleed 03/30/2021   Gout 04/11/2017   Heart attack (HCC) 1980s   Hematochezia    History of blood transfusion 1994   "couldn't stop bleeding from my period"   History of drug abuse in remission (HCC) 11/28/2015   Quit in 2017   Hyperlipidemia 03/31/2021   Hyperlipidemia LDL goal <70    Hypocalcemia    Hypokalemia    Hypomagnesemia    Incontinence    Manic depression (HCC)    Morbid obesity (HCC) 10/18/2010   Obstructive sleep apnea 10/18/2010   On home oxygen therapy    "6L; 24/7" (04/10/2016)   OSA on CPAP    "wear mask sometimes" (04/10/2016)   Painless rectal bleeding 12/31/2015   Paranoid (HCC)    "sometimes; I'm on RX for it" (04/10/2016)   Periumbilical abdominal pain    Prolonged Q-T interval on ECG    Rectal bleeding 12/31/2015   Recurrent syncope 07/09/2021  Rhabdomyolysis 06/16/2021   Schizoaffective disorder, bipolar type (HCC) 04/05/2018   Seasonal allergies    Seborrheic keratoses 12/31/2013   Seizures (HCC)    "don't know what kind; last one was ~ 1 yr ago" (04/10/2016)   Sinus bradycardia    Skin ulcer of sacrum, limited to breakdown of skin (HCC) 03/08/2022   Spondylolisthesis at L3-L4 level 06/12/2022   Stroke [facility]) 1980s   denies residual on 04/10/2016   Thrush 09/19/2013   Type 2 diabetes mellitus (HCC) 10/18/2010   Past Surgical History:  Past Surgical History:  Procedure Laterality Date   A/V  FISTULAGRAM N/A 02/01/2023   Procedure: A/V Fistulagram;  Surgeon: Tyler Pita, MD;  Location: [facility] INVASIVE CV LAB;  Service: Cardiovascular;  Laterality: N/A;   AV FISTULA PLACEMENT Left 12/21/2022   Procedure: INSERTION OF LEFT ARM ARTERIOVENOUS (AV) GORE-TEX GRAFT;  Surgeon: Leonie Douglas, MD;  Location: [facility] OR;  Service: Vascular;  Laterality: Left;   CESAREAN SECTION  1997   COLONOSCOPY WITH PROPOFOL N/A 04/01/2021   Procedure: COLONOSCOPY WITH PROPOFOL;  Surgeon: Jeani Hawking, MD;  Location[facility] ENDOSCOPY;  Service: Gastroenterology;  Laterality: N/A;  Rectal bleeding with drop in hemoglobin to 7.2 g/dL   HERNIA REPAIR     IR CHOLANGIOGRAM EXISTING TUBE  07/20/2016   IR PERC CHOLECYSTOSTOMY  05/10/2016   IR RADIOLOGIST EVAL & MGMT  06/08/2016   IR RADIOLOGIST EVAL & MGMT  06/29/2016   IR SINUS/FIST TUBE CHK-NON GI  07/12/2016   RIGHT/LEFT HEART CATH AND CORONARY ANGIOGRAPHY N/A 06/19/2017   Procedure: RIGHT/LEFT HEART CATH AND CORONARY ANGIOGRAPHY;  Surgeon: Dolores Patty, MD;  Location: [facility] INVASIVE CV LAB;  Service: Cardiovascular;  Laterality: N/A;   TIBIA IM NAIL INSERTION Right 07/12/2016   Procedure: INTRAMEDULLARY (IM) NAIL RIGHT TIBIA;  Surgeon: Tarry Kos, MD;  Location: [facility] OR;  Service: Orthopedics;  Laterality: Right;   UMBILICAL HERNIA REPAIR  ~ 1963   "that's why I don't have a belly button"   VAGINAL HYSTERECTOMY     HPI:  Tiffany [name] is a 63 y.o. female who presented with complaints of chest pain with coughing, as well as abdominal pain during coughing spells.  Developed cough 1/23 and evaluated in the ER on 1/25 with fever of 101 and diagnosed with influenza A and discharged home at the time in reasonable condition. CXR 1/30: "There is new heterogeneous, at least 6 x 7.5 cm masslike consolidation overlying the left mid lower lung zones."  Pt with medical history significant of end-stage renal disease on dialysis Monday Wednesday Friday. Patient reports  being typically wheelchair-bound due to paraparesis due to neuropathy    Assessment / Plan / Recommendation  Clinical Impression  Pt seen for swallowing evaluation seated in bedside chair.  She was eating AM meal on SLP arrival.  Pt exhibited cough on initial trial of thin liquid by straw. She reports coughing in presence and absence of POs but feels it is worse when drinking.  There was no coughing with cup sips, which she says is how she usually drinks. With further trials of thin liquid by straw there were no clinical s/s of aspiraiton.  Pt tolerated puree without difficulty.  She attempted regular solid trial, but spit out bolus partially chewed. She had done this with her breakfast tray as well.  Pt does not feel she is having difficulty chewing.  [name] edentulous but does not wear dentures typically.  She reports that her stomach feels bad.  She does not want to swallow the food.  Recommend continuing current diet with pt choosing softer foods as needed. SLP will reassess when pt is agreeable to solid trials.  Of note: pt with findings of possible stricture on UGI in 2022, but refused EGD at that time 2/2 required anaesthesia.  She did not complain of any symptoms of esophageal dysphagia today.    Recommend regular texture diet with thin liquids.   SLP Visit Diagnosis: Dysphagia, unspecified (R13.10)    Aspiration Risk  Mild aspiration risk    Diet Recommendation Regular;Thin liquid    Liquid Administration via: Cup;Straw Medication Administration:  (No specific precautions.  As tolerated) Supervision: Patient able to self feed Compensations: Slow rate;Small sips/bites Postural Changes: Seated upright at 90 degrees    Other  Recommendations Oral Care Recommendations: Oral care BID    Recommendations for follow up therapy are one component of a multi-disciplinary discharge planning process, led by the attending physician.  Recommendations may be updated based on patient status,  additional functional criteria and insurance authorization.  Follow up Recommendations  (TBD)      Assistance Recommended at Discharge  N/A  Functional Status Assessment Patient has had a recent decline in their functional status and demonstrates the ability to make significant improvements in function in a reasonable and predictable amount of time.  Frequency and Duration min 2x/week  2 weeks       Prognosis Prognosis for improved oropharyngeal function: Good      Swallow Study   General Date of Onset: 02/14/23 HPI: LISSY DEUSER is a 63 y.o. female who presented with complaints of chest pain with coughing, as well as abdominal pain during coughing spells.  Developed cough 1/23 and evaluated in the ER on 1/25 with fever of 101 and diagnosed with influenza A and discharged home at the time in reasonable condition. CXR 1/30: "There is new heterogeneous, at least 6 x 7.5 cm masslike consolidation overlying the left mid lower lung zones."  Pt with medical history significant of end-stage renal disease on dialysis Monday Wednesday Friday. Patient reports being typically wheelchair-bound due to paraparesis due to neuropathy Type of Study: Bedside Swallow Evaluation Previous Swallow Assessment: None Diet Prior to this Study: Regular;Thin liquids (Level 0) Temperature Spikes Noted: No Respiratory Status: Nasal cannula History of Recent Intubation: No Behavior/Cognition: Alert;Cooperative;Pleasant mood Oral Cavity Assessment: Within Functional Limits Oral Care Completed by SLP: No Oral Cavity - Dentition: Edentulous Vision: Functional for self-feeding Self-Feeding Abilities: Able to feed self Patient Positioning: Upright in chair Baseline Vocal Quality: Normal Volitional Cough:  (Fair) Volitional Swallow: Able to elicit    Oral/Motor/Sensory Function Overall Oral Motor/Sensory Function: Mild impairment Facial ROM: Within Functional Limits Facial Symmetry: Within Functional  Limits Lingual Symmetry: Within Functional Limits Lingual Strength: Reduced Velum: Within Functional Limits Mandible: Within Functional Limits   Ice Chips Ice chips: Not tested   Thin Liquid Thin Liquid: Within functional limits Presentation: Straw;Cup    Nectar Thick Nectar Thick Liquid: Not tested   Honey Thick Honey Thick Liquid: Not tested   Puree Puree: Within functional limits Presentation: Spoon   Solid     Solid: Impaired Other Comments: Expectoration      Kerrie Pleasure, MA, CCC-SLP Acute Rehabilitation Services Office: [phone] 02/15/2023,9:55 AM

## 2023-02-16 ENCOUNTER — Inpatient Hospital Stay (HOSPITAL_COMMUNITY): Payer: HMO

## 2023-02-16 DIAGNOSIS — J9601 Acute respiratory failure with hypoxia: Secondary | ICD-10-CM | POA: Diagnosis not present

## 2023-02-16 LAB — CBC WITH DIFFERENTIAL/PLATELET
Abs Immature Granulocytes: 0.21 10*3/uL — ABNORMAL HIGH (ref 0.00–0.07)
Basophils Absolute: 0 10*3/uL (ref 0.0–0.1)
Basophils Relative: 0 %
Eosinophils Absolute: 0.1 10*3/uL (ref 0.0–0.5)
Eosinophils Relative: 1 %
HCT: 33.4 % — ABNORMAL LOW (ref 36.0–46.0)
Hemoglobin: 10.9 g/dL — ABNORMAL LOW (ref 12.0–15.0)
Immature Granulocytes: 2 %
Lymphocytes Relative: 13 %
Lymphs Abs: 1.3 10*3/uL (ref 0.7–4.0)
MCH: 29.5 pg (ref 26.0–34.0)
MCHC: 32.6 g/dL (ref 30.0–36.0)
MCV: 90.5 fL (ref 80.0–100.0)
Monocytes Absolute: 1.2 10*3/uL — ABNORMAL HIGH (ref 0.1–1.0)
Monocytes Relative: 12 %
Neutro Abs: 6.9 10*3/uL (ref 1.7–7.7)
Neutrophils Relative %: 72 %
Platelets: 374 10*3/uL (ref 150–400)
RBC: 3.69 MIL/uL — ABNORMAL LOW (ref 3.87–5.11)
RDW: 15.2 % (ref 11.5–15.5)
WBC: 9.7 10*3/uL (ref 4.0–10.5)
nRBC: 0 % (ref 0.0–0.2)

## 2023-02-16 LAB — MAGNESIUM: Magnesium: 1.5 mg/dL — ABNORMAL LOW (ref 1.7–2.4)

## 2023-02-16 MED ORDER — FUROSEMIDE 10 MG/ML IJ SOLN
160.0000 mg | Freq: Once | INTRAMUSCULAR | Status: AC
  Administered 2023-02-17: 160 mg via INTRAVENOUS
  Filled 2023-02-16: qty 10

## 2023-02-16 MED ORDER — ONDANSETRON HCL 4 MG/2ML IJ SOLN
4.0000 mg | Freq: Four times a day (QID) | INTRAMUSCULAR | Status: DC | PRN
  Administered 2023-02-16: 4 mg via INTRAVENOUS
  Filled 2023-02-16: qty 2

## 2023-02-16 MED ORDER — MAGNESIUM SULFATE 4 GM/100ML IV SOLN
4.0000 g | Freq: Once | INTRAVENOUS | Status: AC
  Administered 2023-02-16: 4 g via INTRAVENOUS
  Filled 2023-02-16: qty 100

## 2023-02-16 NOTE — Plan of Care (Signed)

## 2023-02-16 NOTE — Progress Notes (Signed)
[name] Progress Note   Subjective: Seen up in chair. Says she feels much better. HD later today.      Objective Vitals:   02/16/23 0336 02/16/23 0632 02/16/23 0800 02/16/23 1007  BP: 116/81  (!) 156/98 (!) 156/98  Pulse: 77  91 87  Resp:      Temp:   98.9 F (37.2 C)   TempSrc: Oral  Oral   SpO2: 95%  94%   Weight:  86.7 kg    Height:       Physical Exam General: Chronically ill female in NAD Heart: S1,S2 RRR  Lungs: CTAB A/P slightly decreased in bases No WOB Abdomen: obese NABS Extremities: no LE edema Dialysis Access: R TDC drsg intact   Additional Objective Labs: Basic Metabolic Panel: Recent Labs  Lab 02/13/23 1957 02/14/23 0828 02/15/23 0517  NA 130* 130* 131*  K 2.9* 3.0* 3.4*  CL 95* 91* 92*  CO2 19* 23 28  GLUCOSE 83 95 99  BUN 35* 39* 19  CREATININE 3.84* 4.19* 3.03*  CALCIUM 8.7* 8.6* 8.5*  PHOS  --  3.5  --    Liver Function Tests: Recent Labs  Lab 02/14/23 0828  AST 29  ALT 14  ALKPHOS 110  BILITOT 0.6  PROT 6.8  ALBUMIN 2.9*   No results for input(s): "LIPASE", "AMYLASE" in the last 168 hours. CBC: Recent Labs  Lab 02/13/23 1957 02/14/23 0828 02/14/23 1911 02/15/23 0517 02/16/23 0531  WBC 8.7 8.7 9.4 9.2 9.7  NEUTROABS 6.2  --   --   --  6.9  HGB 9.9* 10.1* 11.2* 10.1* 10.9*  HCT 31.1* 31.6* 35.0* 32.1* 33.4*  MCV 91.5 91.3 91.4 92.5 90.5  PLT 340 350 386 354 374   Blood Culture    Component Value Date/Time   SDES BLOOD RIGHT ARM 02/14/2023 1911   SPECREQUEST  02/14/2023 1911    BOTTLES DRAWN AEROBIC AND ANAEROBIC Blood Culture results may not be optimal due to an inadequate volume of blood received in culture bottles   CULT  02/14/2023 1911    NO GROWTH 2 DAYS Performed at [facility], [address] [place], [place] [address]    REPTSTATUS PENDING 02/14/2023 1911    Cardiac Enzymes: No results for input(s): "CKTOTAL", "CKMB", "CKMBINDEX", "TROPONINI" in the last 168 hours. CBG: No  results for input(s): "GLUCAP" in the last 168 hours. Iron Studies: No results for input(s): "IRON", "TIBC", "TRANSFERRIN", "FERRITIN" in the last 72 hours. @lablastinr3 @ Studies/Results: CT CHEST ABDOMEN PELVIS WO CONTRAST Addendum Date: 02/15/2023 ADDENDUM REPORT: 02/15/2023 16:54 ADDENDUM: Additional finding: There is increased density and thickening along the left side anterior abdominal wall, rectus abdominus muscle. This could represent a muscle hematoma. Please see axial series 3, image 58. Please correlate with symptoms and recommend follow-up to confirm resolution to exclude underlying pathology. Critical Value/emergent results were called by telephone at the time of interpretation on 02/15/2023 at 4:44 pm to provider [facility] , who verbally acknowledged these results. Electronically Signed   By: Karen Kays M.D.   On: 02/15/2023 16:54   Result Date: 02/15/2023 CLINICAL DATA:  Lymphadenopathy.  Lung mass.  * Tracking Code: BO *. EXAM: CT CHEST, ABDOMEN AND PELVIS WITHOUT CONTRAST TECHNIQUE: Multidetector CT imaging of the chest, abdomen and pelvis was performed following the standard protocol without IV contrast. RADIATION DOSE REDUCTION: This exam was performed according to the departmental dose-optimization program which includes automated exposure control, adjustment of the mA and/or kV according to  patient size and/or use of iterative reconstruction technique. COMPARISON:  Renal stone CT and chest CT without contrast 11/07/2022. chest x-ray 02/14/2023. FINDINGS: CT CHEST FINDINGS Cardiovascular: Large-bore right IJ catheter in place with tip seen at extending down to the SVC right atrial junction region. Trace pericardial fluid. Heart is nonenlarged. Few coronary artery calcifications are seen. Please correlate for other coronary risk factors. Grossly the thoracic aorta is normal course and caliber on this noncontrast exam. Mediastinum/Nodes: Normal caliber thoracic esophagus which is  patulous. Preserved thyroid gland. No specific abnormal lymph node enlargement identified in the axillary region, hilum or mediastinum. Lungs/Pleura: Large area of consolidation with air bronchograms along the left upper lobe compared components of ground-glass interstitial septal thickening. There is also some patchy areas seen along both lower lobes as well. Minimal area in the middle lobe. These are new from 2024 October. Acute infiltrative process is possible such as pneumonia. Please correlate clinical presentation. Would recommend follow up after treatment to confirm resolution and exclude underlying pathology. No associated pneumothorax or effusion. There is some breathing motion. Musculoskeletal: Scattered degenerative changes of the thoracic spine. Stable lucent areas in the vertebral body at T10. Old left-sided rib fracture deformities. CT ABDOMEN PELVIS FINDINGS Hepatobiliary: On this non IV contrast exam, grossly preserved hepatic parenchyma. Stones in the nondilated gallbladder. Pancreas: Unremarkable. No pancreatic ductal dilatation or surrounding inflammatory changes. Spleen: Normal in size without focal abnormality. Adrenals/Urinary Tract: 11 mm left-sided adrenal nodule with Hounsfield units of less than 0 consistent with an adenoma, unchanged from previous. Right adrenal gland is preserved. No abnormal calcification seen within either kidney nor along the expected course of either ureter. Asymmetric wall thickening along a slightly urinary bladder, increased from previous. Please see series 3, image 99. A bladder lesion is not excluded. This could be aggressive. Please correlate with clinical presentation and follow-up evaluation. Stomach/Bowel: Air-fluid level along the nondilated stomach. Small bowel is nondilated. Normal caliber retrocecal appendix. This is along the appendix. Large bowel overall has a normal caliber with scattered stool and scattered diverticula particularly along the left side  of the colon. Redundant loops of colon are seen diffusely including sigmoid and transverse colon. Vascular/Lymphatic: Aortic atherosclerosis. No enlarged abdominal or pelvic lymph nodes. Reproductive: Uterus is absent. Once again there is a cystic lesion in the left adnexa anteriorly which appears to abut the left ovary. Previous is measured 7.3 x 6.8 cm and today 7.3 by 6.8 cm. By noncontrast CT this appears relatively simple. This been present since at least October 2021. That time this measured up to 7.6 cm. Multiple long-term stability. Other: No free air or free fluid. Musculoskeletal: Degenerative changes seen of the spine and pelvis. Once again there is deformity at L3-4 with listhesis and fusion. Patient has a clinical history of previous discitis. There is associated stenosis of the central canal at this level IMPRESSION: Multifocal consolidative opacities identified in the lungs, greatest in left upper lobe. Mild areas in the lower lobes and minimal in the middle lobe. Multifocal infiltrate or pneumonia is possible. Please correlate clinical presentation recommend follow-up to confirm resolution and exclude underlying secondary pathology. No specific developing abnormal lymph node enlargement in the chest, abdomen and pelvis. Gallstones.  Colonic diverticula. Stable left adrenal adenoma. Stable large cystic lesion in the left adnexa, likely ovarian but has been stable since at least 2021 demonstrating long-term stability. New asymmetric wall thickening along left side of the urinary bladder. An aggressive lesion is in the differential recommend further workup when  appropriate. Electronically Signed: By: Karen Kays M.D. On: 02/15/2023 13:40   Medications:  magnesium sulfate bolus IVPB 4 g (02/16/23 1127)    atorvastatin  20 mg Oral Daily   calcitRIOL  0.25 mcg Oral Q M,W,F   carvedilol  3.125 mg Oral BID   Chlorhexidine Gluconate Cloth  6 each Topical Q0600   dextromethorphan-guaiFENesin  1  tablet Oral BID   doxycycline  100 mg Oral Q12H   DULoxetine  30 mg Oral Daily   escitalopram  20 mg Oral Daily   ezetimibe  10 mg Oral Daily   fluticasone furoate-vilanterol  1 puff Inhalation Daily   And   umeclidinium bromide  1 puff Inhalation Daily   lidocaine  1 patch Transdermal Q24H   mirtazapine  15 mg Oral QHS   oseltamivir  30 mg Oral Q M,W,F-2000   pantoprazole  40 mg Oral Daily   QUEtiapine  100 mg Oral Daily   QUEtiapine  200 mg Oral QHS   sodium chloride flush  3 mL Intravenous Q12H   torsemide  60 mg Oral BID     Outpatient dialysis unit: [facility] Outpatient dialysis prescription: BFR 500, 3K, EDW 89kg, 4hr, 2ca, TDC, mircera given 1/27, on IV iron, calcitriol 0.32mcg with each treatment    Assessment/Recommendations: KONSTANTINA NACHREINER is a/an 63 y.o. female with a past medical history notable for ESRD on HD admitted with influenza infection.    # ESRD:  Plan for HD today and maintain MWF schedule   # Volume/ hypertension: EDW 89kg. BP fairly low outpatient. Stop amlodipine. Lower Coreg.  Chronic volume overload and persistent today.  Ultrafiltration as tolerated.  Increase torsemide to 60mg  BID. Obtain chest x-ray given shortness of breath   # Anemia of Chronic Kidney Disease: Hemoglobin 10.1. No iv iron for now. ESA when due   # Secondary Hyperparathyroidism/Hyperphosphatemia: Cont calcitriol. Obtain phos level   # Vascular access: TDC with no issues   # Hypokalemia: Replace as needed.  High K bath   # Influenza infection: Management per primary team   # Additional recommendations: - Dose all meds for creatinine clearance < 10 ml/min  - Unless absolutely necessary, no MRIs with gadolinium.  - Implement save arm precautions.  Prefer needle sticks in the dorsum of the hands or wrists.  No blood pressure measurements in arm. - If blood transfusion is requested during hemodialysis sessions, please alert [name] prior to the session.  - Use synthetic opioids  (Fentanyl/Dilaudid) if needed   Recommendations were discussed with the primary team.  Dene Gentry. Iriel Nason NP-C 02/16/2023, 11:38 AM  [employer] [phone]

## 2023-02-16 NOTE — Progress Notes (Addendum)
Speech Language Pathology Treatment: Dysphagia  Patient Details Name: Tiffany [name] MRN: [id number] DOB: 08/31/1960 Today's Date: 02/16/2023 Time: 7829-5621 SLP Time Calculation (min) (ACUTE ONLY): 10 min  Assessment / Plan / Recommendation Clinical Impression  Pt seen for ongoing dysphagia management.  She was eating her morning meal on SLP arrival. She reported that she was having trouble eating because she did not like the taste and denied swallowing difficulties. She was agreeable to taking another bite of food from tray.  She used liquid was independently.  There were no clinical s/s of aspiration and no oral residue.  Pt appears safe to continue current diet.  She has no further ST needs.  SLP will sign off.  Recommend regular texture diet with thin liquids.    HPI HPI: Tiffany [name] is a 63 y.o. female who presented with complaints of chest pain with coughing, as well as abdominal pain during coughing spells.  Developed cough 1/23 and evaluated in the ER on 1/25 with fever of 101 and diagnosed with influenza A and discharged home at the time in reasonable condition. CXR 1/27: "There is new heterogeneous, at least 6 x 7.5 cm masslike consolidation overlying the left mid lower lung zones." Follow up CT 1/30: "Large area of consolidation with air bronchograms  along the left upper lobe compared components of ground-glass interstitial septal thickening. There is also some patchy areas seen along both lower lobes as well. Minimal area in the middle lobe." Pt with medical history significant of end-stage renal disease on dialysis Monday Wednesday Friday. Patient reports being typically wheelchair-bound due to paraparesis due to neuropathy      SLP Plan  All goals met      Recommendations for follow up therapy are one component of a multi-disciplinary discharge planning process, led by the attending physician.  Recommendations may be updated based on patient status, additional functional  criteria and insurance authorization.    Recommendations  Diet recommendations: Regular;Thin liquid Liquids provided via: Cup;Straw Medication Administration: Whole meds with liquid (No specific precautions.  As tolerated) Compensations: Slow rate;Small sips/bites                  Oral care BID   None Dysphagia, unspecified (R13.10)     All goals met     Kerrie Pleasure, MA, CCC-SLP Acute Rehabilitation Services Office: [phone] 02/16/2023, 8:55 AM

## 2023-02-16 NOTE — Progress Notes (Addendum)
   02/16/23 1610  TOC Brief Assessment  Insurance and Status Reviewed ([employer])  Patient has primary care physician Yes Pecola Leisure, Betti, MD)  Home environment has been reviewed From home alone  Prior level of function: Wheelchair bound  Prior/Current Home Services Current home services (PCS services per Daughter; [facility] be [facility] by [facility])  Social Drivers of Health Review SDOH reviewed no interventions necessary  Readmission risk has been reviewed Yes (43%)  Transition of care needs transition of care needs identified, TOC will continue to follow (HH has been arranged with [facility]- No DME needed   PTAR will need to be arranged)   Patient to DC to home where she lives alone. RNCM attempted to speak with Patient but she was asleep. Daughter provided information. PTAR will need to be arranged  Med Nec form in EPIC with Saturday's date  (2/1) Patient is O2 dependent 2-3 L at baseline [facility] will provide services Order for [facility] is needed  There will be a 4-5 day delay but if we can add SN to [facility] order, they will see sooner

## 2023-02-16 NOTE — Progress Notes (Signed)
Physical Therapy Treatment Patient Details Name: Tiffany [name] MRN: [id number] DOB: 10-19-1960 Today's Date: 02/16/2023   History of Present Illness 63 y.o. female admitted 02/13/23 with cough, fever, chest and abdominal pain.  Noted positive for influenza A and CXR positive for L lower lobe infiltrate concern for bacterial pneumonia.  PMH ESRD on HD MWF, obesity, OSA, HTN, CHF, COPD, DM2, CKD, PTSD, gout, seizures, CVA, anoxic brain injury, spinal stenosis, peripheral neuropathy, schizophrenia/schizoaffective disorder, CAD s/p MI x2, HLD, multiple falls, and drug abuse (cocaine).    PT Comments  Pt received sitting in the recliner and agreeable to session. Pt able to demonstrate lateral transfer from the recliner to the bed and back with good technique and CGA for safety. Pt able to perform 1 standing trial with minA. Pt demonstrates increased instability and anxiety in standing requiring minA for stability. Pt demonstrates short standing tolerance and declines further trials. Education provided on performing BLE exercise during the day for increased BLE strength. Pt continues to benefit from PT services to progress toward functional mobility goals.    If plan is discharge home, recommend the following: A little help with walking and/or transfers;Assist for transportation   Can travel by private vehicle        Equipment Recommendations  None recommended by PT    Recommendations for Other Services       Precautions / Restrictions Precautions Precautions: Fall Precaution Comments: reports frequent falls Restrictions Weight Bearing Restrictions Per Provider Order: No     Mobility  Bed Mobility               General bed mobility comments: in recliner at beginning and end of session    Transfers Overall transfer level: Needs assistance Equipment used: Rolling walker (2 wheels), None Transfers: Bed to chair/wheelchair/BSC, Sit to/from Stand Sit to Stand: Min assist           Lateral/Scoot Transfers: Contact guard assist General transfer comment: from recliner to bed, then back to recliner with drop arm with CGA for safety. STS from recliner with min A for power up and cues for hand placement        Balance Overall balance assessment: Needs assistance Sitting-balance support: Feet supported, No upper extremity supported Sitting balance-Leahy Scale: Good     Standing balance support: Bilateral upper extremity supported, Reliant on assistive device for balance Standing balance-Leahy Scale: Poor Standing balance comment: with RW support                            Cognition Arousal: Alert Behavior During Therapy: WFL for tasks assessed/performed Overall Cognitive Status: History of cognitive impairments - at baseline                                          Exercises      General Comments        Pertinent Vitals/Pain Pain Assessment Pain Assessment: Faces Faces Pain Scale: Hurts little more Pain Location: RUE Pain Descriptors / Indicators: Sore, Aching Pain Intervention(s): Monitored during session     PT Goals (current goals can now be found in the care plan section) Acute Rehab PT Goals Patient Stated Goal: to return home PT Goal Formulation: With patient Time For Goal Achievement: 03/01/23 Progress towards PT goals: Progressing toward goals    Frequency    Min 1X/week  AM-PAC PT "6 Clicks" Mobility   Outcome Measure  Help needed turning from your back to your side while in a flat bed without using bedrails?: A Little Help needed moving from lying on your back to sitting on the side of a flat bed without using bedrails?: A Little Help needed moving to and from a bed to a chair (including a wheelchair)?: A Little Help needed standing up from a chair using your arms (e.g., wheelchair or bedside chair)?: A Little Help needed to walk in hospital room?: Total Help needed climbing 3-5 steps with a  railing? : Total 6 Click Score: 14    End of Session Equipment Utilized During Treatment: Oxygen;Gait belt Activity Tolerance: Patient limited by fatigue;Patient tolerated treatment well Patient left: in chair;with call bell/phone within reach;with chair alarm set;with nursing/sitter in room Nurse Communication: Mobility status PT Visit Diagnosis: Muscle weakness (generalized) (M62.81);History of falling (Z91.81);Other abnormalities of gait and mobility (R26.89)     Time: 1610-9604 PT Time Calculation (min) (ACUTE ONLY): 18 min  Charges:    $Therapeutic Activity: 8-22 mins PT General Charges $$ ACUTE PT VISIT: 1 Visit                     Johny Shock, PTA Acute Rehabilitation Services Secure Chat Preferred  [phone]    Johny Shock 02/16/2023, 1:35 PM

## 2023-02-16 NOTE — Progress Notes (Signed)
PROGRESS NOTE                                                                                                                                                                                                             Patient Demographics:    Tiffany [name], is a 63 y.o. female, DOB - Oct 24, 1960, WUJ:[id number]  Outpatient Primary MD for the patient is [name], MD    LOS - 2  Admit date - 02/13/2023    Chief Complaint  Patient presents with   Cough   Generalized Body Aches       Brief Narrative (HPI from H&P)    63 y.o. female with medical history significant of end-stage renal disease on dialysis Monday Wednesday Friday.  Patient reports being typically wheelchair-bound due to paraparesis due to neuropathy.  Patient seems to have been in her usual state of health till around January 23, 6 days ago when she developed a new onset cough.  Patient was actually evaluated in the ER on January 25.  Patient was found to have fever 101 F at the time.  Patient was diagnosed with influenza A and discharged home at the time in reasonable condition.  Patient returned yesterday with complaints of chest pain with coughing, as well as abdominal pain during coughing spells, diagnosed with acute hypoxic respiratory failure due to influenza A pneumonia there was also concern for lung mass and she was admitted for further care.   Subjective:   Patient in bed, appears comfortable, denies any headache, no fever, no chest pain or pressure, no shortness of breath , mild left flank abdominal pain. No new focal weakness.   Assessment  & Plan :    Acute hypoxic respiratory failure (HCC) due to influenza A pneumonia.  Also question of left lung mass.  Could have postobstructive pneumonia due to that. He has been placed on Tamiflu with good improvement, continue supplemental oxygen, at home she uses 2 to 3 L nasal cannula oxygen intermittently, use  I-S and flutter valve for pulmonary toiletry, advance activity, titrate down oxygen, CT chest stable.  De-escalate antibiotics to doxycycline and monitor cultures.  Schizophrenia (HCC) Continue Cymbalta Lexapro Remeron and Seroquel.  Frequent falls - Patient has chronic paraparesis apparently due to neuropathy.  Patient wheelchair dependent at home.  Chest pain due to cough, supportive care and  monitor.  Stable EKG, pain currently resolved with supportive care.  Single troponin borderline, of no significance clinically.  Subsequent troponins stable.  ESRD - on MWF schedule.  Nephrology on board.  Obesity: BMI of 34.8.  Follow-up with PCP for weight loss.  Mild left-sided flank/abdominal pain due to heparin injection site hematoma.  Monitor, heparin discontinued, placed on SCDs.  Will continue to monitor type screen done.      Condition - Extremely Guarded  Family Communication  : Called daughter [name] [phone]  on 02/15/2023 at 10:08 AM and message left  Code Status :  Full  Consults  :  Renal  PUD Prophylaxis : PPI   Procedures  :      CT -  Multifocal consolidative opacities identified in the lungs, greatest in left upper lobe. Mild areas in the lower lobes and minimal in the middle lobe. Multifocal infiltrate or pneumonia is possible. Please correlate clinical presentation recommend follow-up to confirm resolution and exclude underlying secondary pathology. No specific developing abnormal lymph node enlargement in the chest, abdomen and pelvis. Gallstones.  Colonic diverticula. Stable left adrenal adenoma. Stable large cystic lesion in the left adnexa, likely ovarian but has been stable since at least 2021 demonstrating long-term stability. New asymmetric wall thickening along left side of the urinary bladder. An aggressive lesion is in the differential recommend further workup when appropriate.  There is increased density and thickening along the left side anterior abdominal wall,  rectus abdominus muscle. This could represent a muscle hematoma. Please see axial series 3, image 58. Please correlate with symptoms and recommend follow-up to confirm resolution to exclude underlying pathology.      Disposition Plan  :    Status is: Inpatient   DVT Prophylaxis  :    SCDs Start: 02/14/23 0744    Lab Results  Component Value Date   PLT 374 02/16/2023    Diet :  Diet Order             Diet regular Room service appropriate? Yes; Fluid consistency: Thin; Fluid restriction: 1800 mL Fluid  Diet effective now                    Inpatient Medications  Scheduled Meds:  atorvastatin  20 mg Oral Daily   calcitRIOL  0.25 mcg Oral Q M,W,F   carvedilol  3.125 mg Oral BID   Chlorhexidine Gluconate Cloth  6 each Topical Q0600   dextromethorphan-guaiFENesin  1 tablet Oral BID   doxycycline  100 mg Oral Q12H   DULoxetine  30 mg Oral Daily   escitalopram  20 mg Oral Daily   ezetimibe  10 mg Oral Daily   fluticasone furoate-vilanterol  1 puff Inhalation Daily   And   umeclidinium bromide  1 puff Inhalation Daily   lidocaine  1 patch Transdermal Q24H   mirtazapine  15 mg Oral QHS   oseltamivir  30 mg Oral Q M,W,F-2000   pantoprazole  40 mg Oral Daily   QUEtiapine  100 mg Oral Daily   QUEtiapine  200 mg Oral QHS   sodium chloride flush  3 mL Intravenous Q12H   torsemide  60 mg Oral BID   Continuous Infusions:  magnesium sulfate bolus IVPB      PRN Meds:.acetaminophen **OR** acetaminophen, albuterol, alteplase, heparin, HYDROcodone-acetaminophen, lidocaine (PF), lidocaine-prilocaine, pentafluoroprop-tetrafluoroeth, polyethylene glycol     Objective:   Vitals:   02/16/23 0336 02/16/23 0632 02/16/23 0800 02/16/23 1007  BP: 116/81  (!) 156/98 [name])  156/98  Pulse: 77  91 87  Resp:      Temp:   98.9 F (37.2 C)   TempSrc: Oral  Oral   SpO2: 95%  94%   Weight:  86.7 kg    Height:        Wt Readings from Last 3 Encounters:  02/16/23 86.7 kg  02/10/23  88.5 kg  01/30/23 88.5 kg     Intake/Output Summary (Last 24 hours) at 02/16/2023 1028 Last data filed at 02/16/2023 1610 Gross per 24 hour  Intake --  Output 2230 ml  Net -2230 ml     Physical Exam  Awake Alert, No new F.N deficits, Normal affect [place].AT,PERRAL Supple Neck, No JVD,   Symmetrical Chest wall movement, Good air movement bilaterally, CTAB RRR,No Gallops,Rubs or new Murmurs,  +ve B.Sounds, Abd Soft, mild left flank and lower quadrant tenderness No Cyanosis, Clubbing or edema        Data Review:    Recent Labs  Lab 02/13/23 1957 02/14/23 0828 02/14/23 1911 02/15/23 0517 02/16/23 0531  WBC 8.7 8.7 9.4 9.2 9.7  HGB 9.9* 10.1* 11.2* 10.1* 10.9*  HCT 31.1* 31.6* 35.0* 32.1* 33.4*  PLT 340 350 386 354 374  MCV 91.5 91.3 91.4 92.5 90.5  MCH 29.1 29.2 29.2 29.1 29.5  MCHC 31.8 32.0 32.0 31.5 32.6  RDW 15.2 15.0 15.1 15.2 15.2  LYMPHSABS 1.5  --   --   --  1.3  MONOABS 0.8  --   --   --  1.2*  EOSABS 0.2  --   --   --  0.1  BASOSABS 0.0  --   --   --  0.0    Recent Labs  Lab 02/13/23 1957 02/14/23 0828 02/15/23 0517 02/16/23 0531  NA 130* 130* 131*  --   K 2.9* 3.0* 3.4*  --   CL 95* 91* 92*  --   CO2 19* 23 28  --   ANIONGAP 16* 16* 11  --   GLUCOSE 83 95 99  --   BUN 35* 39* 19  --   CREATININE 3.84* 4.19* 3.03*  --   AST  --  29  --   --   ALT  --  14  --   --   ALKPHOS  --  110  --   --   BILITOT  --  0.6  --   --   ALBUMIN  --  2.9*  --   --   INR  --   --  1.2  --   MG  --   --   --  1.5*  PHOS  --  3.5  --   --   CALCIUM 8.7* 8.6* 8.5*  --       Recent Labs  Lab 02/13/23 1957 02/14/23 0828 02/15/23 0517 02/16/23 0531  INR  --   --  1.2  --   MG  --   --   --  1.5*  CALCIUM 8.7* 8.6* 8.5*  --     --------------------------------------------------------------------------------------------------------------- Lab Results  Component Value Date   CHOL 151 11/14/2022   HDL 65 11/14/2022   LDLCALC 66 11/14/2022   LDLDIRECT 52  04/05/2012   TRIG 100 11/14/2022   CHOLHDL 2.3 11/14/2022    Lab Results  Component Value Date   HGBA1C 5.0 01/05/2022     Micro Results Recent Results (from the past 240 hours)  Resp panel by RT-PCR (RSV, Flu A&B, Covid) Anterior Nasal  Swab     Status: Abnormal   Collection Time: 02/10/23  3:30 AM   Specimen: Anterior Nasal Swab  Result Value Ref Range Status   SARS Coronavirus 2 by RT PCR NEGATIVE NEGATIVE Final   Influenza A by PCR POSITIVE (A) NEGATIVE Final   Influenza B by PCR NEGATIVE NEGATIVE Final    Comment: (NOTE) The Xpert Xpress SARS-CoV-2/FLU/RSV plus assay is intended as an aid in the diagnosis of influenza from Nasopharyngeal swab specimens and should not be used as a sole basis for treatment. Nasal washings and aspirates are unacceptable for Xpert Xpress SARS-CoV-2/FLU/RSV testing.  Fact Sheet for Patients: BloggerCourse.com  Fact Sheet for Healthcare Providers: SeriousBroker.it  This test is not yet approved or cleared by the [place] FDA and has been authorized for detection and/or diagnosis of SARS-CoV-2 by FDA under an Emergency Use Authorization (EUA). This EUA will remain in effect (meaning this test can be used) for the duration of the COVID-19 declaration under Section 564(b)(1) of the Act, 21 U.S.C. section 360bbb-3(b)(1), unless the authorization is terminated or revoked.     Resp Syncytial Virus by PCR NEGATIVE NEGATIVE Final    Comment: (NOTE) Fact Sheet for Patients: BloggerCourse.com  Fact Sheet for Healthcare Providers: SeriousBroker.it  This test is not yet approved or cleared by the [place] FDA and has been authorized for detection and/or diagnosis of SARS-CoV-2 by FDA under an Emergency Use Authorization (EUA). This EUA will remain in effect (meaning this test can be used) for the duration of the COVID-19 declaration under  Section 564(b)(1) of the Act, 21 U.S.C. section 360bbb-3(b)(1), unless the authorization is terminated or revoked.  Performed at [facility], [address], [place], [place] [address]   MRSA Next Gen by PCR, Nasal     Status: None   Collection Time: 02/14/23 11:46 AM   Specimen: Nasal Mucosa; Nasal Swab  Result Value Ref Range Status   MRSA by PCR Next Gen NOT DETECTED NOT DETECTED Final    Comment: (NOTE) The GeneXpert MRSA Assay (FDA approved for NASAL specimens only), is one component of a comprehensive MRSA colonization surveillance program. It is not intended to diagnose MRSA infection nor to guide or monitor treatment for MRSA infections. Test performance is not FDA approved in patients less than 59 years old. Performed at [facility], [address] [place], [place] [address]   Culture, blood (Routine X 2) w Reflex to ID Panel     Status: None (Preliminary result)   Collection Time: 02/14/23  7:10 PM   Specimen: BLOOD RIGHT HAND  Result Value Ref Range Status   Specimen Description BLOOD RIGHT HAND  Final   Special Requests   Final    BOTTLES DRAWN AEROBIC AND ANAEROBIC Blood Culture results may not be optimal due to an inadequate volume of blood received in culture bottles   Culture   Final    NO GROWTH 2 DAYS Performed at [facility], [address], [place], [place] [address]    Report Status PENDING  Incomplete  Culture, blood (Routine X 2) w Reflex to ID Panel     Status: None (Preliminary result)   Collection Time: 02/14/23  7:11 PM   Specimen: BLOOD RIGHT ARM  Result Value Ref Range Status   Specimen Description BLOOD RIGHT ARM  Final   Special Requests   Final    BOTTLES DRAWN AEROBIC AND ANAEROBIC Blood Culture results may not be optimal due to an  inadequate volume of blood received in culture bottles   Culture   Final    NO GROWTH 2 DAYS Performed at [facility], [address] [place], [place] [address]    Report Status  PENDING  Incomplete    Radiology Reports CT CHEST ABDOMEN PELVIS WO CONTRAST Addendum Date: 02/15/2023 ADDENDUM REPORT: 02/15/2023 16:54 ADDENDUM: Additional finding: There is increased density and thickening along the left side anterior abdominal wall, rectus abdominus muscle. This could represent a muscle hematoma. Please see axial series 3, image 58. Please correlate with symptoms and recommend follow-up to confirm resolution to exclude underlying pathology. Critical Value/emergent results were called by telephone at the time of interpretation on 02/15/2023 at 4:44 pm to provider [facility] , who verbally acknowledged these results. Electronically Signed   By: Karen Kays M.D.   On: 02/15/2023 16:54   Result Date: 02/15/2023 CLINICAL DATA:  Lymphadenopathy.  Lung mass.  * Tracking Code: BO *. EXAM: CT CHEST, ABDOMEN AND PELVIS WITHOUT CONTRAST TECHNIQUE: Multidetector CT imaging of the chest, abdomen and pelvis was performed following the standard protocol without IV contrast. RADIATION DOSE REDUCTION: This exam was performed according to the departmental dose-optimization program which includes automated exposure control, adjustment of the mA and/or kV according to patient size and/or use of iterative reconstruction technique. COMPARISON:  Renal stone CT and chest CT without contrast 11/07/2022. chest x-ray 02/14/2023. FINDINGS: CT CHEST FINDINGS Cardiovascular: Large-bore right IJ catheter in place with tip seen at extending down to the SVC right atrial junction region. Trace pericardial fluid. Heart is nonenlarged. Few coronary artery calcifications are seen. Please correlate for other coronary risk factors. Grossly the thoracic aorta is normal course and caliber on this noncontrast exam. Mediastinum/Nodes: Normal caliber thoracic esophagus which is patulous. Preserved thyroid gland. No specific abnormal lymph node enlargement identified in the axillary region, hilum or mediastinum. Lungs/Pleura:  Large area of consolidation with air bronchograms along the left upper lobe compared components of ground-glass interstitial septal thickening. There is also some patchy areas seen along both lower lobes as well. Minimal area in the middle lobe. These are new from 2024 October. Acute infiltrative process is possible such as pneumonia. Please correlate clinical presentation. Would recommend follow up after treatment to confirm resolution and exclude underlying pathology. No associated pneumothorax or effusion. There is some breathing motion. Musculoskeletal: Scattered degenerative changes of the thoracic spine. Stable lucent areas in the vertebral body at T10. Old left-sided rib fracture deformities. CT ABDOMEN PELVIS FINDINGS Hepatobiliary: On this non IV contrast exam, grossly preserved hepatic parenchyma. Stones in the nondilated gallbladder. Pancreas: Unremarkable. No pancreatic ductal dilatation or surrounding inflammatory changes. Spleen: Normal in size without focal abnormality. Adrenals/Urinary Tract: 11 mm left-sided adrenal nodule with Hounsfield units of less than 0 consistent with an adenoma, unchanged from previous. Right adrenal gland is preserved. No abnormal calcification seen within either kidney nor along the expected course of either ureter. Asymmetric wall thickening along a slightly urinary bladder, increased from previous. Please see series 3, image 99. A bladder lesion is not excluded. This could be aggressive. Please correlate with clinical presentation and follow-up evaluation. Stomach/Bowel: Air-fluid level along the nondilated stomach. Small bowel is nondilated. Normal caliber retrocecal appendix. This is along the appendix. Large bowel overall has a normal caliber with scattered stool and scattered diverticula particularly along the left side of the colon. Redundant loops of colon are seen diffusely including sigmoid and transverse colon. Vascular/Lymphatic: Aortic atherosclerosis. No  enlarged abdominal or pelvic  lymph nodes. Reproductive: Uterus is absent. Once again there is a cystic lesion in the left adnexa anteriorly which appears to abut the left ovary. Previous is measured 7.3 x 6.8 cm and today 7.3 by 6.8 cm. By noncontrast CT this appears relatively simple. This been present since at least October 2021. That time this measured up to 7.6 cm. Multiple long-term stability. Other: No free air or free fluid. Musculoskeletal: Degenerative changes seen of the spine and pelvis. Once again there is deformity at L3-4 with listhesis and fusion. Patient has a clinical history of previous discitis. There is associated stenosis of the central canal at this level IMPRESSION: Multifocal consolidative opacities identified in the lungs, greatest in left upper lobe. Mild areas in the lower lobes and minimal in the middle lobe. Multifocal infiltrate or pneumonia is possible. Please correlate clinical presentation recommend follow-up to confirm resolution and exclude underlying secondary pathology. No specific developing abnormal lymph node enlargement in the chest, abdomen and pelvis. Gallstones.  Colonic diverticula. Stable left adrenal adenoma. Stable large cystic lesion in the left adnexa, likely ovarian but has been stable since at least 2021 demonstrating long-term stability. New asymmetric wall thickening along left side of the urinary bladder. An aggressive lesion is in the differential recommend further workup when appropriate. Electronically Signed: By: Karen Kays M.D. On: 02/15/2023 13:40   DG CHEST PORT 1 VIEW Result Date: 02/14/2023 CLINICAL DATA:  Shortness of breath.  Left-sided chest pain. EXAM: PORTABLE CHEST 1 VIEW COMPARISON:  11/07/2022. FINDINGS: There is new heterogeneous, at least 6 x 7.5 cm masslike consolidation overlying the left mid lower lung zones. Correlate clinically. Further evaluation with chest CT scan is recommended. Bilateral lungs are otherwise clear. No pulmonary  edema. Bilateral costophrenic angles are clear. No pneumothorax. Stable cardio-mediastinal silhouette. No acute osseous abnormalities. The soft tissues are within normal limits. Right IJ hemodialysis catheter again seen with its tip overlying the cavoatrial junction region. IMPRESSION: There is a heterogeneous masslike consolidation overlying the left mid lower lung zones. Further evaluation with chest CT scan is recommended. Electronically Signed   By: Jules Schick M.D.   On: 02/14/2023 11:44      Signature  -   Susa Raring M.D on 02/16/2023 at 10:28 AM   -  To page go to [email or web address]

## 2023-02-17 DIAGNOSIS — J9601 Acute respiratory failure with hypoxia: Secondary | ICD-10-CM | POA: Diagnosis not present

## 2023-02-17 LAB — C-REACTIVE PROTEIN: CRP: 17.6 mg/dL — ABNORMAL HIGH (ref <1.0)

## 2023-02-17 LAB — CBC WITH DIFFERENTIAL/PLATELET
Abs Immature Granulocytes: 0.37 10*3/uL — ABNORMAL HIGH (ref 0.00–0.07)
Basophils Absolute: 0.1 10*3/uL (ref 0.0–0.1)
Basophils Relative: 1 %
Eosinophils Absolute: 0.2 10*3/uL (ref 0.0–0.5)
Eosinophils Relative: 1 %
HCT: 34.5 % — ABNORMAL LOW (ref 36.0–46.0)
Hemoglobin: 11 g/dL — ABNORMAL LOW (ref 12.0–15.0)
Immature Granulocytes: 4 %
Lymphocytes Relative: 9 %
Lymphs Abs: 0.9 10*3/uL (ref 0.7–4.0)
MCH: 28.7 pg (ref 26.0–34.0)
MCHC: 31.9 g/dL (ref 30.0–36.0)
MCV: 90.1 fL (ref 80.0–100.0)
Monocytes Absolute: 1.2 10*3/uL — ABNORMAL HIGH (ref 0.1–1.0)
Monocytes Relative: 11 %
Neutro Abs: 7.8 10*3/uL — ABNORMAL HIGH (ref 1.7–7.7)
Neutrophils Relative %: 74 %
Platelets: 362 10*3/uL (ref 150–400)
RBC: 3.83 MIL/uL — ABNORMAL LOW (ref 3.87–5.11)
RDW: 15 % (ref 11.5–15.5)
WBC: 10.4 10*3/uL (ref 4.0–10.5)
nRBC: 0 % (ref 0.0–0.2)

## 2023-02-17 LAB — COMPREHENSIVE METABOLIC PANEL
ALT: 15 U/L (ref 0–44)
AST: 24 U/L (ref 15–41)
Albumin: 2.8 g/dL — ABNORMAL LOW (ref 3.5–5.0)
Alkaline Phosphatase: 122 U/L (ref 38–126)
Anion gap: 16 — ABNORMAL HIGH (ref 5–15)
BUN: 14 mg/dL (ref 8–23)
CO2: 25 mmol/L (ref 22–32)
Calcium: 9 mg/dL (ref 8.9–10.3)
Chloride: 91 mmol/L — ABNORMAL LOW (ref 98–111)
Creatinine, Ser: 2.26 mg/dL — ABNORMAL HIGH (ref 0.44–1.00)
GFR, Estimated: 24 mL/min — ABNORMAL LOW (ref >60)
Glucose, Bld: 85 mg/dL (ref 70–99)
Potassium: 3.2 mmol/L — ABNORMAL LOW (ref 3.5–5.1)
Sodium: 132 mmol/L — ABNORMAL LOW (ref 135–145)
Total Bilirubin: 0.6 mg/dL (ref 0.0–1.2)
Total Protein: 7.4 g/dL (ref 6.5–8.1)

## 2023-02-17 LAB — PROCALCITONIN: Procalcitonin: 0.17 ng/mL

## 2023-02-17 LAB — RENAL FUNCTION PANEL
Albumin: 2.5 g/dL — ABNORMAL LOW (ref 3.5–5.0)
Anion gap: 17 — ABNORMAL HIGH (ref 5–15)
BUN: 38 mg/dL — ABNORMAL HIGH (ref 8–23)
CO2: 27 mmol/L (ref 22–32)
Calcium: 9.5 mg/dL (ref 8.9–10.3)
Chloride: 86 mmol/L — ABNORMAL LOW (ref 98–111)
Creatinine, Ser: 4.01 mg/dL — ABNORMAL HIGH (ref 0.44–1.00)
GFR, Estimated: 12 mL/min — ABNORMAL LOW (ref >60)
Glucose, Bld: 95 mg/dL (ref 70–99)
Phosphorus: 3.7 mg/dL (ref 2.5–4.6)
Potassium: 2.9 mmol/L — ABNORMAL LOW (ref 3.5–5.1)
Sodium: 130 mmol/L — ABNORMAL LOW (ref 135–145)

## 2023-02-17 LAB — PHOSPHORUS: Phosphorus: 2 mg/dL — ABNORMAL LOW (ref 2.5–4.6)

## 2023-02-17 LAB — MAGNESIUM: Magnesium: 2.2 mg/dL (ref 1.7–2.4)

## 2023-02-17 MED ORDER — POTASSIUM CHLORIDE CRYS ER 20 MEQ PO TBCR
20.0000 meq | EXTENDED_RELEASE_TABLET | Freq: Once | ORAL | Status: AC
  Administered 2023-02-17: 20 meq via ORAL
  Filled 2023-02-17: qty 1

## 2023-02-17 MED ORDER — POTASSIUM & SODIUM PHOSPHATES 280-160-250 MG PO PACK
2.0000 | PACK | ORAL | Status: AC
  Administered 2023-02-17 (×2): 2 via ORAL
  Filled 2023-02-17 (×3): qty 2

## 2023-02-17 MED ORDER — DEXTROSE 5 % IV SOLN
30.0000 mmol | Freq: Once | INTRAVENOUS | Status: AC
  Administered 2023-02-17: 30 mmol via INTRAVENOUS
  Filled 2023-02-17: qty 10

## 2023-02-17 MED ORDER — POTASSIUM PHOSPHATES 15 MMOLE/5ML IV SOLN: 30.0000 mmol | Freq: Once | INTRAVENOUS | Status: DC

## 2023-02-17 MED ORDER — POTASSIUM CHLORIDE CRYS ER 20 MEQ PO TBCR: 40.0000 meq | EXTENDED_RELEASE_TABLET | Freq: Once | ORAL | Status: DC

## 2023-02-17 NOTE — Progress Notes (Addendum)
[facility] Progress Note   Subjective: In bed, no C/Os. Net UF with HD 02/16/2023 2.5 liters. Very Low K+ and PO4. Hold binders. K+ repleted per primary. Getting Kphos.   Objective Vitals:   02/17/23 0522 02/17/23 0601 02/17/23 0605 02/17/23 0800  BP: 108/79 (!) 110/92  (!) 129/96  Pulse: 78 82  78  Resp: 17 20  16   Temp:  98 F (36.7 C)  98.1 F (36.7 C)  TempSrc:  Oral  Oral  SpO2: 100% 96%  98%  Weight:   82.6 kg   Height:       Physical Exam General: Chronically ill female in NAD Heart: S1,S2 RRR  Lungs: CTAB A/P slightly decreased in bases No WOB Abdomen: obese NABS Extremities: no LE edema Dialysis Access: R TDC drsg intact  Additional Objective Labs: Basic Metabolic Panel: Recent Labs  Lab 02/14/23 0828 02/15/23 0517 02/17/23 0200 02/17/23 0614  NA 130* 131* 130* 132*  K 3.0* 3.4* 2.9* 3.2*  CL 91* 92* 86* 91*  CO2 23 28 27 25   GLUCOSE 95 99 95 85  BUN 39* 19 38* 14  CREATININE 4.19* 3.03* 4.01* 2.26*  CALCIUM 8.6* 8.5* 9.5 9.0  PHOS 3.5  --  3.7 2.0*   Liver Function Tests: Recent Labs  Lab 02/14/23 0828 02/17/23 0200 02/17/23 0614  AST 29  --  24  ALT 14  --  15  ALKPHOS 110  --  122  BILITOT 0.6  --  0.6  PROT 6.8  --  7.4  ALBUMIN 2.9* 2.5* 2.8*   No results for input(s): "LIPASE", "AMYLASE" in the last 168 hours. CBC: Recent Labs  Lab 02/13/23 1957 02/14/23 0828 02/14/23 1911 02/15/23 0517 02/16/23 0531 02/17/23 0614  WBC 8.7 8.7 9.4 9.2 9.7 10.4  NEUTROABS 6.2  --   --   --  6.9 7.8*  HGB 9.9* 10.1* 11.2* 10.1* 10.9* 11.0*  HCT 31.1* 31.6* 35.0* 32.1* 33.4* 34.5*  MCV 91.5 91.3 91.4 92.5 90.5 90.1  PLT 340 350 386 354 374 362   Blood Culture    Component Value Date/Time   SDES BLOOD RIGHT ARM 02/14/2023 1911   SPECREQUEST  02/14/2023 1911    BOTTLES DRAWN AEROBIC AND ANAEROBIC Blood Culture results may not be optimal due to an inadequate volume of blood received in culture bottles   CULT  02/14/2023 1911     NO GROWTH 3 DAYS Performed at [facility], [address] [place], [place] [address]    REPTSTATUS PENDING 02/14/2023 1911    Cardiac Enzymes: No results for input(s): "CKTOTAL", "CKMB", "CKMBINDEX", "TROPONINI" in the last 168 hours. CBG: No results for input(s): "GLUCAP" in the last 168 hours. Iron Studies: No results for input(s): "IRON", "TIBC", "TRANSFERRIN", "FERRITIN" in the last 72 hours. @lablastinr3 @ Studies/Results: DG CHEST PORT 1 VIEW Result Date: 02/16/2023 CLINICAL DATA:  Shortness of breath EXAM: PORTABLE CHEST 1 VIEW COMPARISON:  02/14/2023, CT 02/15/2023 FINDINGS: Right-sided central venous catheter tip at the right atrium. Low lung volumes. Improved aeration of the left mid to lower lung with residual ground-glass opacity. Stable cardiomediastinal silhouette. No pneumothorax IMPRESSION: Improved aeration of the left mid to lower lung but with residual ground-glass opacity/possible pneumonia. Continued imaging follow-up to resolution is recommended. Electronically Signed   By: Jasmine Pang M.D.   On: 02/16/2023 22:46   CT CHEST ABDOMEN PELVIS WO CONTRAST Addendum Date: 02/15/2023 ADDENDUM REPORT: 02/15/2023 16:54 ADDENDUM: Additional finding: There is increased density and thickening along  the left side anterior abdominal wall, rectus abdominus muscle. This could represent a muscle hematoma. Please see axial series 3, image 58. Please correlate with symptoms and recommend follow-up to confirm resolution to exclude underlying pathology. Critical Value/emergent results were called by telephone at the time of interpretation on 02/15/2023 at 4:44 pm to provider [facility] , who verbally acknowledged these results. Electronically Signed   By: Karen Kays M.D.   On: 02/15/2023 16:54   Result Date: 02/15/2023 CLINICAL DATA:  Lymphadenopathy.  Lung mass.  * Tracking Code: BO *. EXAM: CT CHEST, ABDOMEN AND PELVIS WITHOUT CONTRAST TECHNIQUE: Multidetector CT imaging of the  chest, abdomen and pelvis was performed following the standard protocol without IV contrast. RADIATION DOSE REDUCTION: This exam was performed according to the departmental dose-optimization program which includes automated exposure control, adjustment of the mA and/or kV according to patient size and/or use of iterative reconstruction technique. COMPARISON:  Renal stone CT and chest CT without contrast 11/07/2022. chest x-ray 02/14/2023. FINDINGS: CT CHEST FINDINGS Cardiovascular: Large-bore right IJ catheter in place with tip seen at extending down to the SVC right atrial junction region. Trace pericardial fluid. Heart is nonenlarged. Few coronary artery calcifications are seen. Please correlate for other coronary risk factors. Grossly the thoracic aorta is normal course and caliber on this noncontrast exam. Mediastinum/Nodes: Normal caliber thoracic esophagus which is patulous. Preserved thyroid gland. No specific abnormal lymph node enlargement identified in the axillary region, hilum or mediastinum. Lungs/Pleura: Large area of consolidation with air bronchograms along the left upper lobe compared components of ground-glass interstitial septal thickening. There is also some patchy areas seen along both lower lobes as well. Minimal area in the middle lobe. These are new from 2024 October. Acute infiltrative process is possible such as pneumonia. Please correlate clinical presentation. Would recommend follow up after treatment to confirm resolution and exclude underlying pathology. No associated pneumothorax or effusion. There is some breathing motion. Musculoskeletal: Scattered degenerative changes of the thoracic spine. Stable lucent areas in the vertebral body at T10. Old left-sided rib fracture deformities. CT ABDOMEN PELVIS FINDINGS Hepatobiliary: On this non IV contrast exam, grossly preserved hepatic parenchyma. Stones in the nondilated gallbladder. Pancreas: Unremarkable. No pancreatic ductal dilatation or  surrounding inflammatory changes. Spleen: Normal in size without focal abnormality. Adrenals/Urinary Tract: 11 mm left-sided adrenal nodule with Hounsfield units of less than 0 consistent with an adenoma, unchanged from previous. Right adrenal gland is preserved. No abnormal calcification seen within either kidney nor along the expected course of either ureter. Asymmetric wall thickening along a slightly urinary bladder, increased from previous. Please see series 3, image 99. A bladder lesion is not excluded. This could be aggressive. Please correlate with clinical presentation and follow-up evaluation. Stomach/Bowel: Air-fluid level along the nondilated stomach. Small bowel is nondilated. Normal caliber retrocecal appendix. This is along the appendix. Large bowel overall has a normal caliber with scattered stool and scattered diverticula particularly along the left side of the colon. Redundant loops of colon are seen diffusely including sigmoid and transverse colon. Vascular/Lymphatic: Aortic atherosclerosis. No enlarged abdominal or pelvic lymph nodes. Reproductive: Uterus is absent. Once again there is a cystic lesion in the left adnexa anteriorly which appears to abut the left ovary. Previous is measured 7.3 x 6.8 cm and today 7.3 by 6.8 cm. By noncontrast CT this appears relatively simple. This been present since at least October 2021. That time this measured up to 7.6 cm. Multiple long-term stability. Other: No free air or free fluid.  Musculoskeletal: Degenerative changes seen of the spine and pelvis. Once again there is deformity at L3-4 with listhesis and fusion. Patient has a clinical history of previous discitis. There is associated stenosis of the central canal at this level IMPRESSION: Multifocal consolidative opacities identified in the lungs, greatest in left upper lobe. Mild areas in the lower lobes and minimal in the middle lobe. Multifocal infiltrate or pneumonia is possible. Please correlate  clinical presentation recommend follow-up to confirm resolution and exclude underlying secondary pathology. No specific developing abnormal lymph node enlargement in the chest, abdomen and pelvis. Gallstones.  Colonic diverticula. Stable left adrenal adenoma. Stable large cystic lesion in the left adnexa, likely ovarian but has been stable since at least 2021 demonstrating long-term stability. New asymmetric wall thickening along left side of the urinary bladder. An aggressive lesion is in the differential recommend further workup when appropriate. Electronically Signed: By: Karen Kays M.D. On: 02/15/2023 13:40   Medications:  potassium PHOSPHATE IVPB (in mmol)      atorvastatin  20 mg Oral Daily   calcitRIOL  0.25 mcg Oral Q M,W,F   carvedilol  3.125 mg Oral BID   Chlorhexidine Gluconate Cloth  6 each Topical Q0600   dextromethorphan-guaiFENesin  1 tablet Oral BID   doxycycline  100 mg Oral Q12H   DULoxetine  30 mg Oral Daily   escitalopram  20 mg Oral Daily   ezetimibe  10 mg Oral Daily   fluticasone furoate-vilanterol  1 puff Inhalation Daily   And   umeclidinium bromide  1 puff Inhalation Daily   lidocaine  1 patch Transdermal Q24H   mirtazapine  15 mg Oral QHS   oseltamivir  30 mg Oral Q M,W,F-2000   pantoprazole  40 mg Oral Daily   potassium chloride  20 mEq Oral Once   QUEtiapine  100 mg Oral Daily   QUEtiapine  200 mg Oral QHS   sodium chloride flush  3 mL Intravenous Q12H   torsemide  60 mg Oral BID     Outpatient dialysis unit: [facility] Outpatient dialysis prescription: BFR 500, 3K, EDW 89kg, 4hr, 2ca, TDC, mircera given 1/27, on IV iron, calcitriol 0.76mcg with each treatment    Assessment/Recommendations: Tiffany [name] is a/an 63 y.o. female with a past medical history notable for ESRD on HD admitted with influenza infection.    # ESRD:  Plan for HD today and maintain MWF schedule. Next HD 02/19/2023   # Volume/ hypertension: EDW 89kg. BP fairly low outpatient.  Stop amlodipine. Lower Coreg.  Chronic volume overload and persistent today.  Ultrafiltration as tolerated.  Increase torsemide to 60mg  BID. Obtain chest x-ray given shortness of breath   # Anemia of Chronic Kidney Disease: Hemoglobin 10.1. No iv iron for now. ESA when due   # Secondary Hyperparathyroidism/Hyperphosphatemia: Cont calcitriol. Very low Po4. Rx Kphos 30 mmol IV over 6 hours per primary. Follow labs.    # Vascular access: TDC with no issues   # Hypokalemia: Replace as needed.  High K bath   # Influenza infection: Management per primary team   # Additional recommendations: - Dose all meds for creatinine clearance < 10 ml/min  - Unless absolutely necessary, no MRIs with gadolinium.  - Implement save arm precautions.  Prefer needle sticks in the dorsum of the hands or wrists.  No blood pressure measurements in arm. - If blood transfusion is requested during hemodialysis sessions, please alert [name] prior to the session.  - Use synthetic opioids (Fentanyl/Dilaudid) if  needed   Recommendations were discussed with the primary team.    Dene Gentry. Tymeer Vaquera NP-C 02/17/2023, 10:30 AM  [employer] [phone]

## 2023-02-17 NOTE — Plan of Care (Signed)

## 2023-02-17 NOTE — Progress Notes (Signed)
Potassium PHOSPHATE 30 mmol in dextrose 5% infusion initiated at 1218 this shift. Patient is now refusing this medication. Primary RN has slowed it down and patient continues to say it burns and hurts and she does not want it. MD made aware.

## 2023-02-17 NOTE — Progress Notes (Signed)
Patient doesn't central line, tunneled HD cath

## 2023-02-17 NOTE — Plan of Care (Signed)
  Problem: Education: Goal: Knowledge of General Education information will improve Description: Including pain rating scale, medication(s)/side effects and non-pharmacologic comfort measures Outcome: Progressing   Problem: Health Behavior/Discharge Planning: Goal: Ability to manage health-related needs will improve Outcome: Progressing   Problem: Clinical Measurements: Goal: Ability to maintain clinical measurements within normal limits will improve Outcome: Progressing Goal: Will remain free from infection Outcome: Progressing Goal: Diagnostic test results will improve Outcome: Progressing Goal: Respiratory complications will improve Outcome: Progressing   Problem: Activity: Goal: Risk for activity intolerance will decrease Outcome: Progressing   Problem: Coping: Goal: Level of anxiety will decrease Outcome: Progressing

## 2023-02-17 NOTE — Progress Notes (Signed)
PROGRESS NOTE                                                                                                                                                                                                             Patient Demographics:    Tiffany [name], is a 63 y.o. female, DOB - 1960-05-07, ZOX:[id number]  Outpatient Primary MD for the patient is [name], MD    LOS - 3  Admit date - 02/13/2023    Chief Complaint  Patient presents with   Cough   Generalized Body Aches       Brief Narrative (HPI from H&P)    63 y.o. female with medical history significant of end-stage renal disease on dialysis Monday Wednesday Friday.  Patient reports being typically wheelchair-bound due to paraparesis due to neuropathy.  Patient seems to have been in her usual state of health till around January 23, 6 days ago when she developed a new onset cough.  Patient was actually evaluated in the ER on January 25.  Patient was found to have fever 101 F at the time.  Patient was diagnosed with influenza A and discharged home at the time in reasonable condition.  Patient returned yesterday with complaints of chest pain with coughing, as well as abdominal pain during coughing spells, diagnosed with acute hypoxic respiratory failure due to influenza A pneumonia there was also concern for lung mass and she was admitted for further care.   Subjective:   Patient in bed, appears comfortable, denies any headache, no fever, no chest pain or pressure, no shortness of breath , no abdominal pain. No new focal weakness.   Assessment  & Plan :   Acute hypoxic respiratory failure (HCC) due to influenza A pneumonia.  Also question of left lung mass.  Could have postobstructive pneumonia due to that. He has been placed on Tamiflu with good improvement, continue supplemental oxygen, at home she uses 2 to 3 L nasal cannula oxygen intermittently, use I-S and flutter  valve for pulmonary toiletry, advance activity, titrate down oxygen, CT chest stable.  De-escalate antibiotics to doxycycline and monitor cultures.  Schizophrenia (HCC) Continue Cymbalta Lexapro Remeron and Seroquel.  Frequent falls - Patient has chronic paraparesis apparently due to neuropathy.  Patient wheelchair dependent at home.  Chest pain due to cough, supportive care and monitor.  Stable  EKG, pain currently resolved with supportive care.  Single troponin borderline, of no significance clinically.  Subsequent troponins stable.  ESRD - on MWF schedule.  Nephrology on board.  Obesity: BMI of 34.8.  Follow-up with PCP for weight loss.  Mild left-sided flank/abdominal pain due to heparin injection site hematoma.  Monitor, heparin discontinued, placed on SCDs.  Will continue to monitor type screen done.      Condition - Extremely Guarded  Family Communication  : Called daughter [name] [phone]  on 02/15/2023 at 10:08 AM and message left  Code Status :  Full  Consults  :  Renal  PUD Prophylaxis : PPI   Procedures  :      CT -  Multifocal consolidative opacities identified in the lungs, greatest in left upper lobe. Mild areas in the lower lobes and minimal in the middle lobe. Multifocal infiltrate or pneumonia is possible. Please correlate clinical presentation recommend follow-up to confirm resolution and exclude underlying secondary pathology. No specific developing abnormal lymph node enlargement in the chest, abdomen and pelvis. Gallstones.  Colonic diverticula. Stable left adrenal adenoma. Stable large cystic lesion in the left adnexa, likely ovarian but has been stable since at least 2021 demonstrating long-term stability. New asymmetric wall thickening along left side of the urinary bladder. An aggressive lesion is in the differential recommend further workup when appropriate.  There is increased density and thickening along the left side anterior abdominal wall, rectus abdominus  muscle. This could represent a muscle hematoma. Please see axial series 3, image 58. Please correlate with symptoms and recommend follow-up to confirm resolution to exclude underlying pathology.      Disposition Plan  :    Status is: Inpatient   DVT Prophylaxis  :    SCDs Start: 02/14/23 0744    Lab Results  Component Value Date   PLT 362 02/17/2023    Diet :  Diet Order             Diet regular Room service appropriate? Yes; Fluid consistency: Thin; Fluid restriction: 1800 mL Fluid  Diet effective now                    Inpatient Medications  Scheduled Meds:  atorvastatin  20 mg Oral Daily   calcitRIOL  0.25 mcg Oral Q M,W,F   carvedilol  3.125 mg Oral BID   Chlorhexidine Gluconate Cloth  6 each Topical Q0600   dextromethorphan-guaiFENesin  1 tablet Oral BID   doxycycline  100 mg Oral Q12H   DULoxetine  30 mg Oral Daily   escitalopram  20 mg Oral Daily   ezetimibe  10 mg Oral Daily   fluticasone furoate-vilanterol  1 puff Inhalation Daily   And   umeclidinium bromide  1 puff Inhalation Daily   lidocaine  1 patch Transdermal Q24H   mirtazapine  15 mg Oral QHS   oseltamivir  30 mg Oral Q M,W,F-2000   pantoprazole  40 mg Oral Daily   potassium chloride  20 mEq Oral Once   QUEtiapine  100 mg Oral Daily   QUEtiapine  200 mg Oral QHS   sodium chloride flush  3 mL Intravenous Q12H   torsemide  60 mg Oral BID   Continuous Infusions:  potassium PHOSPHATE IVPB (in mmol)      PRN Meds:.acetaminophen **OR** acetaminophen, albuterol, alteplase, heparin, HYDROcodone-acetaminophen, lidocaine (PF), lidocaine-prilocaine, ondansetron (ZOFRAN) IV, pentafluoroprop-tetrafluoroeth, polyethylene glycol     Objective:   Vitals:   02/17/23 0522 02/17/23 0601 02/17/23  0605 02/17/23 0800  BP: 108/79 (!) 110/92  (!) 129/96  Pulse: 78 82  78  Resp: 17 20  16   Temp:  98 F (36.7 C)  98.1 F (36.7 C)  TempSrc:  Oral  Oral  SpO2: 100% 96%  98%  Weight:   82.6 kg    Height:        Wt Readings from Last 3 Encounters:  02/17/23 82.6 kg  02/10/23 88.5 kg  01/30/23 88.5 kg     Intake/Output Summary (Last 24 hours) at 02/17/2023 1026 Last data filed at 02/17/2023 0600 Gross per 24 hour  Intake 40.89 ml  Output 3600 ml  Net -3559.11 ml     Physical Exam  Awake Alert, No new F.N deficits, Normal affect [place].AT,PERRAL Supple Neck, No JVD,   Symmetrical Chest wall movement, Good air movement bilaterally, CTAB RRR,No Gallops,Rubs or new Murmurs,  +ve B.Sounds, Abd Soft, mild left flank and lower quadrant tenderness No Cyanosis, Clubbing or edema        Data Review:    Recent Labs  Lab 02/13/23 1957 02/14/23 0828 02/14/23 1911 02/15/23 0517 02/16/23 0531 02/17/23 0614  WBC 8.7 8.7 9.4 9.2 9.7 10.4  HGB 9.9* 10.1* 11.2* 10.1* 10.9* 11.0*  HCT 31.1* 31.6* 35.0* 32.1* 33.4* 34.5*  PLT 340 350 386 354 374 362  MCV 91.5 91.3 91.4 92.5 90.5 90.1  MCH 29.1 29.2 29.2 29.1 29.5 28.7  MCHC 31.8 32.0 32.0 31.5 32.6 31.9  RDW 15.2 15.0 15.1 15.2 15.2 15.0  LYMPHSABS 1.5  --   --   --  1.3 0.9  MONOABS 0.8  --   --   --  1.2* 1.2*  EOSABS 0.2  --   --   --  0.1 0.2  BASOSABS 0.0  --   --   --  0.0 0.1    Recent Labs  Lab 02/13/23 1957 02/14/23 0828 02/15/23 0517 02/16/23 0531 02/17/23 0200 02/17/23 0614  NA 130* 130* 131*  --  130* 132*  K 2.9* 3.0* 3.4*  --  2.9* 3.2*  CL 95* 91* 92*  --  86* 91*  CO2 19* 23 28  --  27 25  ANIONGAP 16* 16* 11  --  17* 16*  GLUCOSE 83 95 99  --  95 85  BUN 35* 39* 19  --  38* 14  CREATININE 3.84* 4.19* 3.03*  --  4.01* 2.26*  AST  --  29  --   --   --  24  ALT  --  14  --   --   --  15  ALKPHOS  --  110  --   --   --  122  BILITOT  --  0.6  --   --   --  0.6  ALBUMIN  --  2.9*  --   --  2.5* 2.8*  CRP  --   --   --   --   --  17.6*  PROCALCITON  --   --   --   --   --  0.17  INR  --   --  1.2  --   --   --   MG  --   --   --  1.5*  --  2.2  PHOS  --  3.5  --   --  3.7 2.0*  CALCIUM 8.7* 8.6*  8.5*  --  9.5 9.0      Recent Labs  Lab 02/13/23 1957  02/14/23 1478 02/15/23 0517 02/16/23 0531 02/17/23 0200 02/17/23 0614  CRP  --   --   --   --   --  17.6*  PROCALCITON  --   --   --   --   --  0.17  INR  --   --  1.2  --   --   --   MG  --   --   --  1.5*  --  2.2  CALCIUM 8.7* 8.6* 8.5*  --  9.5 9.0    --------------------------------------------------------------------------------------------------------------- Lab Results  Component Value Date   CHOL 151 11/14/2022   HDL 65 11/14/2022   LDLCALC 66 11/14/2022   LDLDIRECT 52 04/05/2012   TRIG 100 11/14/2022   CHOLHDL 2.3 11/14/2022    Lab Results  Component Value Date   HGBA1C 5.0 01/05/2022     Micro Results Recent Results (from the past 240 hours)  Resp panel by RT-PCR (RSV, Flu A&B, Covid) Anterior Nasal Swab     Status: Abnormal   Collection Time: 02/10/23  3:30 AM   Specimen: Anterior Nasal Swab  Result Value Ref Range Status   SARS Coronavirus 2 by RT PCR NEGATIVE NEGATIVE Final   Influenza A by PCR POSITIVE (A) NEGATIVE Final   Influenza B by PCR NEGATIVE NEGATIVE Final    Comment: (NOTE) The Xpert Xpress SARS-CoV-2/FLU/RSV plus assay is intended as an aid in the diagnosis of influenza from Nasopharyngeal swab specimens and should not be used as a sole basis for treatment. Nasal washings and aspirates are unacceptable for Xpert Xpress SARS-CoV-2/FLU/RSV testing.  Fact Sheet for Patients: BloggerCourse.com  Fact Sheet for Healthcare Providers: SeriousBroker.it  This test is not yet approved or cleared by the [place] FDA and has been authorized for detection and/or diagnosis of SARS-CoV-2 by FDA under an Emergency Use Authorization (EUA). This EUA will remain in effect (meaning this test can be used) for the duration of the COVID-19 declaration under Section 564(b)(1) of the Act, 21 U.S.C. section 360bbb-3(b)(1), unless the authorization is  terminated or revoked.     Resp Syncytial Virus by PCR NEGATIVE NEGATIVE Final    Comment: (NOTE) Fact Sheet for Patients: BloggerCourse.com  Fact Sheet for Healthcare Providers: SeriousBroker.it  This test is not yet approved or cleared by the [place] FDA and has been authorized for detection and/or diagnosis of SARS-CoV-2 by FDA under an Emergency Use Authorization (EUA). This EUA will remain in effect (meaning this test can be used) for the duration of the COVID-19 declaration under Section 564(b)(1) of the Act, 21 U.S.C. section 360bbb-3(b)(1), unless the authorization is terminated or revoked.  Performed at [facility], [address] [place], [place] [address]   MRSA Next Gen by PCR, Nasal     Status: None   Collection Time: 02/14/23 11:46 AM   Specimen: Nasal Mucosa; Nasal Swab  Result Value Ref Range Status   MRSA by PCR Next Gen NOT DETECTED NOT DETECTED Final    Comment: (NOTE) The GeneXpert MRSA Assay (FDA approved for NASAL specimens only), is one component of a comprehensive MRSA colonization surveillance program. It is not intended to diagnose MRSA infection nor to guide or monitor treatment for MRSA infections. Test performance is not FDA approved in patients less than 1 years old. Performed at [facility], [address] [place], [place] [address]   Culture, blood (Routine X 2) w Reflex to ID Panel     Status: None (Preliminary result)  Collection Time: 02/14/23  7:10 PM   Specimen: BLOOD RIGHT HAND  Result Value Ref Range Status   Specimen Description BLOOD RIGHT HAND  Final   Special Requests   Final    BOTTLES DRAWN AEROBIC AND ANAEROBIC Blood Culture results may not be optimal due to an inadequate volume of blood received in culture bottles   Culture   Final    NO GROWTH 3 DAYS Performed at [facility], [address], [place], [place] [address]    Report Status PENDING   Incomplete  Culture, blood (Routine X 2) w Reflex to ID Panel     Status: None (Preliminary result)   Collection Time: 02/14/23  7:11 PM   Specimen: BLOOD RIGHT ARM  Result Value Ref Range Status   Specimen Description BLOOD RIGHT ARM  Final   Special Requests   Final    BOTTLES DRAWN AEROBIC AND ANAEROBIC Blood Culture results may not be optimal due to an inadequate volume of blood received in culture bottles   Culture   Final    NO GROWTH 3 DAYS Performed at [facility], [address] [place], [place] [address]    Report Status PENDING  Incomplete    Radiology Reports DG CHEST PORT 1 VIEW Result Date: 02/16/2023 CLINICAL DATA:  Shortness of breath EXAM: PORTABLE CHEST 1 VIEW COMPARISON:  02/14/2023, CT 02/15/2023 FINDINGS: Right-sided central venous catheter tip at the right atrium. Low lung volumes. Improved aeration of the left mid to lower lung with residual ground-glass opacity. Stable cardiomediastinal silhouette. No pneumothorax IMPRESSION: Improved aeration of the left mid to lower lung but with residual ground-glass opacity/possible pneumonia. Continued imaging follow-up to resolution is recommended. Electronically Signed   By: Jasmine Pang M.D.   On: 02/16/2023 22:46   CT CHEST ABDOMEN PELVIS WO CONTRAST Addendum Date: 02/15/2023 ADDENDUM REPORT: 02/15/2023 16:54 ADDENDUM: Additional finding: There is increased density and thickening along the left side anterior abdominal wall, rectus abdominus muscle. This could represent a muscle hematoma. Please see axial series 3, image 58. Please correlate with symptoms and recommend follow-up to confirm resolution to exclude underlying pathology. Critical Value/emergent results were called by telephone at the time of interpretation on 02/15/2023 at 4:44 pm to provider [facility] , who verbally acknowledged these results. Electronically Signed   By: Karen Kays M.D.   On: 02/15/2023 16:54   Result Date: 02/15/2023 CLINICAL DATA:   Lymphadenopathy.  Lung mass.  * Tracking Code: BO *. EXAM: CT CHEST, ABDOMEN AND PELVIS WITHOUT CONTRAST TECHNIQUE: Multidetector CT imaging of the chest, abdomen and pelvis was performed following the standard protocol without IV contrast. RADIATION DOSE REDUCTION: This exam was performed according to the departmental dose-optimization program which includes automated exposure control, adjustment of the mA and/or kV according to patient size and/or use of iterative reconstruction technique. COMPARISON:  Renal stone CT and chest CT without contrast 11/07/2022. chest x-ray 02/14/2023. FINDINGS: CT CHEST FINDINGS Cardiovascular: Large-bore right IJ catheter in place with tip seen at extending down to the SVC right atrial junction region. Trace pericardial fluid. Heart is nonenlarged. Few coronary artery calcifications are seen. Please correlate for other coronary risk factors. Grossly the thoracic aorta is normal course and caliber on this noncontrast exam. Mediastinum/Nodes: Normal caliber thoracic esophagus which is patulous. Preserved thyroid gland. No specific abnormal lymph node enlargement identified in the axillary region, hilum or mediastinum. Lungs/Pleura: Large area of consolidation with air bronchograms along the left upper lobe compared components of  ground-glass interstitial septal thickening. There is also some patchy areas seen along both lower lobes as well. Minimal area in the middle lobe. These are new from 2024 October. Acute infiltrative process is possible such as pneumonia. Please correlate clinical presentation. Would recommend follow up after treatment to confirm resolution and exclude underlying pathology. No associated pneumothorax or effusion. There is some breathing motion. Musculoskeletal: Scattered degenerative changes of the thoracic spine. Stable lucent areas in the vertebral body at T10. Old left-sided rib fracture deformities. CT ABDOMEN PELVIS FINDINGS Hepatobiliary: On this non IV  contrast exam, grossly preserved hepatic parenchyma. Stones in the nondilated gallbladder. Pancreas: Unremarkable. No pancreatic ductal dilatation or surrounding inflammatory changes. Spleen: Normal in size without focal abnormality. Adrenals/Urinary Tract: 11 mm left-sided adrenal nodule with Hounsfield units of less than 0 consistent with an adenoma, unchanged from previous. Right adrenal gland is preserved. No abnormal calcification seen within either kidney nor along the expected course of either ureter. Asymmetric wall thickening along a slightly urinary bladder, increased from previous. Please see series 3, image 99. A bladder lesion is not excluded. This could be aggressive. Please correlate with clinical presentation and follow-up evaluation. Stomach/Bowel: Air-fluid level along the nondilated stomach. Small bowel is nondilated. Normal caliber retrocecal appendix. This is along the appendix. Large bowel overall has a normal caliber with scattered stool and scattered diverticula particularly along the left side of the colon. Redundant loops of colon are seen diffusely including sigmoid and transverse colon. Vascular/Lymphatic: Aortic atherosclerosis. No enlarged abdominal or pelvic lymph nodes. Reproductive: Uterus is absent. Once again there is a cystic lesion in the left adnexa anteriorly which appears to abut the left ovary. Previous is measured 7.3 x 6.8 cm and today 7.3 by 6.8 cm. By noncontrast CT this appears relatively simple. This been present since at least October 2021. That time this measured up to 7.6 cm. Multiple long-term stability. Other: No free air or free fluid. Musculoskeletal: Degenerative changes seen of the spine and pelvis. Once again there is deformity at L3-4 with listhesis and fusion. Patient has a clinical history of previous discitis. There is associated stenosis of the central canal at this level IMPRESSION: Multifocal consolidative opacities identified in the lungs, greatest in  left upper lobe. Mild areas in the lower lobes and minimal in the middle lobe. Multifocal infiltrate or pneumonia is possible. Please correlate clinical presentation recommend follow-up to confirm resolution and exclude underlying secondary pathology. No specific developing abnormal lymph node enlargement in the chest, abdomen and pelvis. Gallstones.  Colonic diverticula. Stable left adrenal adenoma. Stable large cystic lesion in the left adnexa, likely ovarian but has been stable since at least 2021 demonstrating long-term stability. New asymmetric wall thickening along left side of the urinary bladder. An aggressive lesion is in the differential recommend further workup when appropriate. Electronically Signed: By: Karen Kays M.D. On: 02/15/2023 13:40   DG CHEST PORT 1 VIEW Result Date: 02/14/2023 CLINICAL DATA:  Shortness of breath.  Left-sided chest pain. EXAM: PORTABLE CHEST 1 VIEW COMPARISON:  11/07/2022. FINDINGS: There is new heterogeneous, at least 6 x 7.5 cm masslike consolidation overlying the left mid lower lung zones. Correlate clinically. Further evaluation with chest CT scan is recommended. Bilateral lungs are otherwise clear. No pulmonary edema. Bilateral costophrenic angles are clear. No pneumothorax. Stable cardio-mediastinal silhouette. No acute osseous abnormalities. The soft tissues are within normal limits. Right IJ hemodialysis catheter again seen with its tip overlying the cavoatrial junction region. IMPRESSION: There is a heterogeneous masslike consolidation  overlying the left mid lower lung zones. Further evaluation with chest CT scan is recommended. Electronically Signed   By: Jules Schick M.D.   On: 02/14/2023 11:44      Signature  -   Susa Raring M.D on 02/17/2023 at 10:26 AM   -  To page go to [email or web address]

## 2023-02-17 NOTE — Progress Notes (Signed)
Received patient in bed to unit.  Alert and oriented.  Informed consent signed and in chart.   TX duration:3.5 hours  Patient complain of chest pain @515  and wanted to end tx 38 minutes early.blood return and @520Rapid  called. Per the rapid team can't come up here at the moment and to transport the patient back to 5w to have work up done.@522  report given to primary nurse and transported pt back to floor  Alert, without acute distress. Hand-off given to patient's nurse.   Access used: dialysis cath Access issues: line reversed.  Total UF removed: 2500 Medication(s) given: heplock 1.6 per port Post HD VS: see table below Post HD weight: unable to weight patient due to complain of chest pain     02/17/23 0522  Vitals  BP 108/79  MAP (mmHg) 99  BP Location Right Arm  BP Method Automatic  Patient Position (if appropriate) Lying  Pulse Rate 78  Pulse Rate Source Monitor  ECG Heart Rate 78  Resp 17  Oxygen Therapy  SpO2 100 %  O2 Device Nasal Cannula  O2 Flow Rate (L/min) 2 L/min  Patient Activity (if Appropriate) In bed  Pulse Oximetry Type Continuous  During Treatment Monitoring  Blood Flow Rate (mL/min) 0 mL/min  Arterial Pressure (mmHg) 34.95 mmHg  Venous Pressure (mmHg) 27.27 mmHg  TMP (mmHg) 21.61 mmHg  Ultrafiltration Rate (mL/min) 0 mL/min  Dialysate Flow Rate (mL/min) 299 ml/min  Dialysate Potassium Concentration 4  Dialysate Calcium Concentration 2.5  Duration of HD Treatment -hour(s) 2.89 hour(s)  Cumulative Fluid Removed (mL) per Treatment  2516.19  HD Safety Checks Performed Yes  Intra-Hemodialysis Comments See progress note  CVC Double Lumen 10/16/22 Right Subclavian  Placement Date/Time: 10/16/22 0100   Inserted prior to hospital arrival?: Placed at another facility  Time Out: Correct patient;Correct site  Orientation: Right  Location: Subclavian  Indication for Insertion or Continuance of Line Hemodialysis  Proximal Lumen Status Flushed;Heparin  locked;Dead end cap in place  Distal Lumen Status Flushed;Heparin locked;Dead end cap in place    [facility]

## 2023-02-18 ENCOUNTER — Inpatient Hospital Stay (HOSPITAL_COMMUNITY): Payer: HMO

## 2023-02-18 DIAGNOSIS — J9601 Acute respiratory failure with hypoxia: Secondary | ICD-10-CM | POA: Diagnosis not present

## 2023-02-18 LAB — CBC WITH DIFFERENTIAL/PLATELET
Abs Immature Granulocytes: 0.42 10*3/uL — ABNORMAL HIGH (ref 0.00–0.07)
Basophils Absolute: 0 10*3/uL (ref 0.0–0.1)
Basophils Relative: 1 %
Eosinophils Absolute: 0.2 10*3/uL (ref 0.0–0.5)
Eosinophils Relative: 2 %
HCT: 33.5 % — ABNORMAL LOW (ref 36.0–46.0)
Hemoglobin: 10.6 g/dL — ABNORMAL LOW (ref 12.0–15.0)
Immature Granulocytes: 5 %
Lymphocytes Relative: 15 %
Lymphs Abs: 1.4 10*3/uL (ref 0.7–4.0)
MCH: 28.9 pg (ref 26.0–34.0)
MCHC: 31.6 g/dL (ref 30.0–36.0)
MCV: 91.3 fL (ref 80.0–100.0)
Monocytes Absolute: 1 10*3/uL (ref 0.1–1.0)
Monocytes Relative: 12 %
Neutro Abs: 5.8 10*3/uL (ref 1.7–7.7)
Neutrophils Relative %: 65 %
Platelets: 380 10*3/uL (ref 150–400)
RBC: 3.67 MIL/uL — ABNORMAL LOW (ref 3.87–5.11)
RDW: 15.1 % (ref 11.5–15.5)
WBC: 8.8 10*3/uL (ref 4.0–10.5)
nRBC: 0 % (ref 0.0–0.2)

## 2023-02-18 LAB — COMPREHENSIVE METABOLIC PANEL
ALT: 13 U/L (ref 0–44)
AST: 18 U/L (ref 15–41)
Albumin: 2.7 g/dL — ABNORMAL LOW (ref 3.5–5.0)
Alkaline Phosphatase: 112 U/L (ref 38–126)
Anion gap: 13 (ref 5–15)
BUN: 32 mg/dL — ABNORMAL HIGH (ref 8–23)
CO2: 25 mmol/L (ref 22–32)
Calcium: 9.2 mg/dL (ref 8.9–10.3)
Chloride: 89 mmol/L — ABNORMAL LOW (ref 98–111)
Creatinine, Ser: 3.59 mg/dL — ABNORMAL HIGH (ref 0.44–1.00)
GFR, Estimated: 14 mL/min — ABNORMAL LOW (ref >60)
Glucose, Bld: 95 mg/dL (ref 70–99)
Potassium: 3.3 mmol/L — ABNORMAL LOW (ref 3.5–5.1)
Sodium: 127 mmol/L — ABNORMAL LOW (ref 135–145)
Total Bilirubin: 0.4 mg/dL (ref 0.0–1.2)
Total Protein: 6.9 g/dL (ref 6.5–8.1)

## 2023-02-18 LAB — C-REACTIVE PROTEIN: CRP: 11.9 mg/dL — ABNORMAL HIGH (ref <1.0)

## 2023-02-18 LAB — PROCALCITONIN: Procalcitonin: 0.14 ng/mL

## 2023-02-18 LAB — MAGNESIUM: Magnesium: 1.9 mg/dL (ref 1.7–2.4)

## 2023-02-18 LAB — PHOSPHORUS: Phosphorus: 4.7 mg/dL — ABNORMAL HIGH (ref 2.5–4.6)

## 2023-02-18 MED ORDER — LIDOCAINE 5 % EX PTCH
1.0000 | MEDICATED_PATCH | CUTANEOUS | Status: DC
  Administered 2023-02-18 – 2023-02-20 (×3): 1 via TRANSDERMAL
  Filled 2023-02-18 (×3): qty 1

## 2023-02-18 MED ORDER — POTASSIUM CHLORIDE 20 MEQ PO PACK
40.0000 meq | PACK | Freq: Once | ORAL | Status: AC
Start: 2023-02-18 — End: 2023-02-18
  Administered 2023-02-18: 40 meq via ORAL
  Filled 2023-02-18: qty 2

## 2023-02-18 NOTE — Plan of Care (Signed)

## 2023-02-18 NOTE — Progress Notes (Signed)
SATURATION QUALIFICATIONS: (This note is used to comply with regulatory documentation for home oxygen)  Patient Saturations on Room Air at Rest = 88%  Patient Saturations on Room Air while Ambulating = 80%  Patient Saturations on 4 Liters of oxygen while Ambulating = 93%  Please briefly explain why patient needs home oxygen: Patient requires 2L [place] while at rest saturated at 93%. Patient desats upon exertion.

## 2023-02-18 NOTE — Progress Notes (Addendum)
[facility] Progress Note   Subjective: Seen in room. Getting EKG. C/O LL chest wall pain-localizing pain to below L breast. Says she has been coughing. PO4 back to goal but K+ still low. [name] asking about HD tomorrow.     Objective Vitals:   02/17/23 2337 02/18/23 0413 02/18/23 0518 02/18/23 0800  BP: 135/81 132/77  112/77  Pulse: 80 77 82 83  Resp: 16   18  Temp:   98 F (36.7 C) 98.1 F (36.7 C)  TempSrc: Oral Oral Oral Oral  SpO2: 90% 95% 95% 94%  Weight:      Height:       Physical Exam General: Chronically ill female in NAD Heart: S1,S2 RRR  Lungs: CTAB A/P slightly decreased in bases No WOB Abdomen: obese NABS Extremities: no LE edema Dialysis Access: R TDC drsg intact   Additional Objective Labs: Basic Metabolic Panel: Recent Labs  Lab 02/17/23 0200 02/17/23 0614 02/18/23 0526  NA 130* 132* 127*  K 2.9* 3.2* 3.3*  CL 86* 91* 89*  CO2 27 25 25   GLUCOSE 95 85 95  BUN 38* 14 32*  CREATININE 4.01* 2.26* 3.59*  CALCIUM 9.5 9.0 9.2  PHOS 3.7 2.0* 4.7*   Liver Function Tests: Recent Labs  Lab 02/14/23 0828 02/17/23 0200 02/17/23 0614 02/18/23 0526  AST 29  --  24 18  ALT 14  --  15 13  ALKPHOS 110  --  122 112  BILITOT 0.6  --  0.6 0.4  PROT 6.8  --  7.4 6.9  ALBUMIN 2.9* 2.5* 2.8* 2.7*   No results for input(s): "LIPASE", "AMYLASE" in the last 168 hours. CBC: Recent Labs  Lab 02/14/23 1911 02/15/23 0517 02/16/23 0531 02/17/23 0614 02/18/23 0526  WBC 9.4 9.2 9.7 10.4 8.8  NEUTROABS  --   --  6.9 7.8* 5.8  HGB 11.2* 10.1* 10.9* 11.0* 10.6*  HCT 35.0* 32.1* 33.4* 34.5* 33.5*  MCV 91.4 92.5 90.5 90.1 91.3  PLT 386 354 374 362 380   Blood Culture    Component Value Date/Time   SDES BLOOD RIGHT ARM 02/14/2023 1911   SPECREQUEST  02/14/2023 1911    BOTTLES DRAWN AEROBIC AND ANAEROBIC Blood Culture results may not be optimal due to an inadequate volume of blood received in culture bottles   CULT  02/14/2023 1911    NO  GROWTH 4 DAYS Performed at [facility], [address] [place], [place] [address]    REPTSTATUS PENDING 02/14/2023 1911    Cardiac Enzymes: No results for input(s): "CKTOTAL", "CKMB", "CKMBINDEX", "TROPONINI" in the last 168 hours. CBG: No results for input(s): "GLUCAP" in the last 168 hours. Iron Studies: No results for input(s): "IRON", "TIBC", "TRANSFERRIN", "FERRITIN" in the last 72 hours. @lablastinr3 @ Studies/Results: DG Chest Port 1 View Result Date: 02/18/2023 CLINICAL DATA:  Shortness of breath EXAM: PORTABLE CHEST 1 VIEW COMPARISON:  02/16/2023 FINDINGS: Right IJ approach hemodialysis catheter remains in place. Stable cardiomegaly. Patchy airspace consolidation in the mid to lower left lung without significant interval change. Minimal bibasilar atelectasis. No new airspace consolidations. No pleural effusion or pneumothorax. IMPRESSION: Patchy airspace consolidation in the mid to lower left lung without significant interval change. Electronically Signed   By: Duanne Guess D.O.   On: 02/18/2023 08:38   DG CHEST PORT 1 VIEW Result Date: 02/16/2023 CLINICAL DATA:  Shortness of breath EXAM: PORTABLE CHEST 1 VIEW COMPARISON:  02/14/2023, CT 02/15/2023 FINDINGS: Right-sided central venous catheter tip at the  right atrium. Low lung volumes. Improved aeration of the left mid to lower lung with residual ground-glass opacity. Stable cardiomediastinal silhouette. No pneumothorax IMPRESSION: Improved aeration of the left mid to lower lung but with residual ground-glass opacity/possible pneumonia. Continued imaging follow-up to resolution is recommended. Electronically Signed   By: Jasmine Pang M.D.   On: 02/16/2023 22:46   Medications:   atorvastatin  20 mg Oral Daily   calcitRIOL  0.25 mcg Oral Q M,W,F   carvedilol  3.125 mg Oral BID   Chlorhexidine Gluconate Cloth  6 each Topical Q0600   dextromethorphan-guaiFENesin  1 tablet Oral BID   doxycycline  100 mg Oral Q12H    DULoxetine  30 mg Oral Daily   escitalopram  20 mg Oral Daily   ezetimibe  10 mg Oral Daily   fluticasone furoate-vilanterol  1 puff Inhalation Daily   And   umeclidinium bromide  1 puff Inhalation Daily   lidocaine  1 patch Transdermal Q24H   lidocaine  1 patch Transdermal Q24H   mirtazapine  15 mg Oral QHS   oseltamivir  30 mg Oral Q M,W,F-2000   pantoprazole  40 mg Oral Daily   potassium chloride  40 mEq Oral Once   QUEtiapine  100 mg Oral Daily   QUEtiapine  200 mg Oral QHS   sodium chloride flush  3 mL Intravenous Q12H   torsemide  60 mg Oral BID     Outpatient dialysis unit: [facility] Outpatient dialysis prescription: BFR 500, 3K, EDW 89kg, 4hr, 2ca, TDC, mircera given 1/27, on IV iron, calcitriol 0.42mcg with each treatment    Assessment/Recommendations: VANITY LARSSON is a/an 63 y.o. female with a past medical history notable for ESRD on HD admitted with influenza infection.    # ESRD:  Plan for HD today and maintain MWF schedule. Next HD 02/19/2023   # Volume/ hypertension: EDW 89kg. BP fairly low outpatient. Stop amlodipine. Lower Coreg.  Chronic volume overload now very much under OP EDW if wts are correct. Will need EDW adjusted on discharge.  Ultrafiltration as tolerated.  Increase torsemide to 60mg  BID. Obtain chest x-ray given shortness of breath   # Anemia of Chronic Kidney Disease: Hemoglobin 10.6. No iv iron for now. ESA when due   # Secondary Hyperparathyroidism/Hyperphosphatemia: Cont calcitriol. Very low Po4. Rx Kphos per primary 02/17/2023. PO4 back to goal. Follow labs.    # Vascular access: TDC with no issues   # Hypokalemia: Replace as needed.  High K bath   # Influenza infection: Management per primary team   # Additional recommendations: - Dose all meds for creatinine clearance < 10 ml/min  - Unless absolutely necessary, no MRIs with gadolinium.  - Implement save arm precautions.  Prefer needle sticks in the dorsum of the hands or wrists.  No  blood pressure measurements in arm. - If blood transfusion is requested during hemodialysis sessions, please alert [name] prior to the session.  - Use synthetic opioids (Fentanyl/Dilaudid) if needed   Recommendations were discussed with the primary team.  Dene Gentry. Lainey Nelson NP-C 02/18/2023, 10:51 AM  [employer] [phone]

## 2023-02-18 NOTE — Plan of Care (Signed)
  Problem: Education: Goal: Knowledge of General Education information will improve Description: Including pain rating scale, medication(s)/side effects and non-pharmacologic comfort measures Outcome: Progressing   Problem: Health Behavior/Discharge Planning: Goal: Ability to manage health-related needs will improve Outcome: Progressing   Problem: Clinical Measurements: Goal: Will remain free from infection Outcome: Progressing Goal: Diagnostic test results will improve Outcome: Progressing Goal: Respiratory complications will improve Outcome: Progressing   Problem: Nutrition: Goal: Adequate nutrition will be maintained Outcome: Progressing   Problem: Pain Managment: Goal: General experience of comfort will improve and/or be controlled Outcome: Progressing

## 2023-02-18 NOTE — Progress Notes (Signed)
PROGRESS NOTE                                                                                                                                                                                                             Patient Demographics:    Tiffany [name], is a 63 y.o. female, DOB - May 14, 1960, ZOX:[id number]  Outpatient Primary MD for the patient is [name], MD    LOS - 4  Admit date - 02/13/2023    Chief Complaint  Patient presents with   Cough   Generalized Body Aches       Brief Narrative (HPI from H&P)    63 y.o. female with medical history significant of end-stage renal disease on dialysis Monday Wednesday Friday.  Patient reports being typically wheelchair-bound due to paraparesis due to neuropathy.  Patient seems to have been in her usual state of health till around January 23, 6 days ago when she developed a new onset cough.  Patient was actually evaluated in the ER on January 25.  Patient was found to have fever 101 F at the time.  Patient was diagnosed with influenza A and discharged home at the time in reasonable condition.  Patient returned yesterday with complaints of chest pain with coughing, as well as abdominal pain during coughing spells, diagnosed with acute hypoxic respiratory failure due to influenza A pneumonia there was also concern for lung mass and she was admitted for further care.   Subjective:   Patient in bed, appears comfortable, denies any headache, no fever, of related pleuritic chest pain, no shortness of breath , no abdominal pain. No focal weakness.   Assessment  & Plan :   Acute hypoxic respiratory failure (HCC) due to influenza A pneumonia.  Also question of left lung mass.  Could have postobstructive pneumonia due to that. He has been placed on Tamiflu with good improvement, continue supplemental oxygen, at home she uses 2 to 3 L nasal cannula oxygen intermittently, use I-S and  flutter valve for pulmonary toiletry, advance activity, titrate down oxygen, CT chest stable.  De-escalate antibiotics to doxycycline and monitor cultures.  Schizophrenia (HCC) Continue Cymbalta Lexapro Remeron and Seroquel.  Frequent falls - Patient has chronic paraparesis apparently due to neuropathy.  Patient wheelchair dependent at home.  Note patient is extremely weak and frail qualifies for SNF but refuses  to go to SNF, adamant on being discharged home, have told her that her risk of readmission is very high, however she is vehemently refusing placement.  Chest pain due to cough, supportive care and monitor.  Stable EKG, pain currently resolved with supportive care.  Single troponin borderline, of no significance clinically.  Subsequent troponins stable.  EKG remains unremarkable.  Lidoderm patch provided, pain is due to sustained cough and intercostal muscle strain.  ESRD - on MWF schedule.  Nephrology on board.  Obesity: BMI of 34.8.  Follow-up with PCP for weight loss.  Mild left-sided flank/abdominal pain due to heparin injection site hematoma.  Monitor, heparin discontinued, placed on SCDs.  Will continue to monitor type screen done.      Condition - Extremely Guarded  Family Communication  : Called daughter [name] [phone]  on 02/15/2023 at 10:08 AM and message left  Code Status :  Full  Consults  :  Renal  PUD Prophylaxis : PPI   Procedures  :      CT -  Multifocal consolidative opacities identified in the lungs, greatest in left upper lobe. Mild areas in the lower lobes and minimal in the middle lobe. Multifocal infiltrate or pneumonia is possible. Please correlate clinical presentation recommend follow-up to confirm resolution and exclude underlying secondary pathology. No specific developing abnormal lymph node enlargement in the chest, abdomen and pelvis. Gallstones.  Colonic diverticula. Stable left adrenal adenoma. Stable large cystic lesion in the left adnexa, likely  ovarian but has been stable since at least 2021 demonstrating long-term stability. New asymmetric wall thickening along left side of the urinary bladder. An aggressive lesion is in the differential recommend further workup when appropriate.  There is increased density and thickening along the left side anterior abdominal wall, rectus abdominus muscle. This could represent a muscle hematoma. Please see axial series 3, image 58. Please correlate with symptoms and recommend follow-up to confirm resolution to exclude underlying pathology.      Disposition Plan  :    Status is: Inpatient   DVT Prophylaxis  :    SCDs Start: 02/14/23 0744    Lab Results  Component Value Date   PLT 380 02/18/2023    Diet :  Diet Order             Diet regular Room service appropriate? Yes; Fluid consistency: Thin; Fluid restriction: 1800 mL Fluid  Diet effective now                    Inpatient Medications  Scheduled Meds:  atorvastatin  20 mg Oral Daily   calcitRIOL  0.25 mcg Oral Q M,W,F   carvedilol  3.125 mg Oral BID   Chlorhexidine Gluconate Cloth  6 each Topical Q0600   dextromethorphan-guaiFENesin  1 tablet Oral BID   doxycycline  100 mg Oral Q12H   DULoxetine  30 mg Oral Daily   escitalopram  20 mg Oral Daily   ezetimibe  10 mg Oral Daily   fluticasone furoate-vilanterol  1 puff Inhalation Daily   And   umeclidinium bromide  1 puff Inhalation Daily   lidocaine  1 patch Transdermal Q24H   lidocaine  1 patch Transdermal Q24H   mirtazapine  15 mg Oral QHS   oseltamivir  30 mg Oral Q M,W,F-2000   pantoprazole  40 mg Oral Daily   QUEtiapine  100 mg Oral Daily   QUEtiapine  200 mg Oral QHS   sodium chloride flush  3 mL Intravenous Q12H  torsemide  60 mg Oral BID   Continuous Infusions:    PRN Meds:.acetaminophen **OR** acetaminophen, albuterol, alteplase, heparin, HYDROcodone-acetaminophen, lidocaine (PF), lidocaine-prilocaine, ondansetron (ZOFRAN) IV,  pentafluoroprop-tetrafluoroeth, polyethylene glycol     Objective:   Vitals:   02/17/23 2337 02/18/23 0413 02/18/23 0518 02/18/23 0800  BP: 135/81 132/77  112/77  Pulse: 80 77 82 83  Resp: 16   18  Temp:   98 F (36.7 C) 98.1 F (36.7 C)  TempSrc: Oral Oral Oral Oral  SpO2: 90% 95% 95% 94%  Weight:      Height:        Wt Readings from Last 3 Encounters:  02/17/23 82.6 kg  02/10/23 88.5 kg  01/30/23 88.5 kg     Intake/Output Summary (Last 24 hours) at 02/18/2023 1046 Last data filed at 02/18/2023 0500 Gross per 24 hour  Intake 200 ml  Output 500 ml  Net -300 ml     Physical Exam  Awake Alert, No new F.N deficits, Normal affect [place].AT,PERRAL Supple Neck, No JVD,   Symmetrical Chest wall movement, Good air movement bilaterally, CTAB RRR,No Gallops,Rubs or new Murmurs,  +ve B.Sounds, Abd Soft, mild left flank and lower quadrant tenderness No Cyanosis, Clubbing or edema        Data Review:    Recent Labs  Lab 02/13/23 1957 02/14/23 0828 02/14/23 1911 02/15/23 0517 02/16/23 0531 02/17/23 0614 02/18/23 0526  WBC 8.7   < > 9.4 9.2 9.7 10.4 8.8  HGB 9.9*   < > 11.2* 10.1* 10.9* 11.0* 10.6*  HCT 31.1*   < > 35.0* 32.1* 33.4* 34.5* 33.5*  PLT 340   < > 386 354 374 362 380  MCV 91.5   < > 91.4 92.5 90.5 90.1 91.3  MCH 29.1   < > 29.2 29.1 29.5 28.7 28.9  MCHC 31.8   < > 32.0 31.5 32.6 31.9 31.6  RDW 15.2   < > 15.1 15.2 15.2 15.0 15.1  LYMPHSABS 1.5  --   --   --  1.3 0.9 1.4  MONOABS 0.8  --   --   --  1.2* 1.2* 1.0  EOSABS 0.2  --   --   --  0.1 0.2 0.2  BASOSABS 0.0  --   --   --  0.0 0.1 0.0   < > = values in this interval not displayed.    Recent Labs  Lab 02/14/23 0828 02/15/23 0517 02/16/23 0531 02/17/23 0200 02/17/23 0614 02/18/23 0526  NA 130* 131*  --  130* 132* 127*  K 3.0* 3.4*  --  2.9* 3.2* 3.3*  CL 91* 92*  --  86* 91* 89*  CO2 23 28  --  27 25 25   ANIONGAP 16* 11  --  17* 16* 13  GLUCOSE 95 99  --  95 85 95  BUN 39* 19  --  38*  14 32*  CREATININE 4.19* 3.03*  --  4.01* 2.26* 3.59*  AST 29  --   --   --  24 18  ALT 14  --   --   --  15 13  ALKPHOS 110  --   --   --  122 112  BILITOT 0.6  --   --   --  0.6 0.4  ALBUMIN 2.9*  --   --  2.5* 2.8* 2.7*  CRP  --   --   --   --  17.6* 11.9*  PROCALCITON  --   --   --   --  0.17 0.14  INR  --  1.2  --   --   --   --   MG  --   --  1.5*  --  2.2 1.9  PHOS 3.5  --   --  3.7 2.0* 4.7*  CALCIUM 8.6* 8.5*  --  9.5 9.0 9.2      Recent Labs  Lab 02/14/23 0828 02/15/23 0517 02/16/23 0531 02/17/23 0200 02/17/23 0614 02/18/23 0526  CRP  --   --   --   --  17.6* 11.9*  PROCALCITON  --   --   --   --  0.17 0.14  INR  --  1.2  --   --   --   --   MG  --   --  1.5*  --  2.2 1.9  CALCIUM 8.6* 8.5*  --  9.5 9.0 9.2    --------------------------------------------------------------------------------------------------------------- Lab Results  Component Value Date   CHOL 151 11/14/2022   HDL 65 11/14/2022   LDLCALC 66 11/14/2022   LDLDIRECT 52 04/05/2012   TRIG 100 11/14/2022   CHOLHDL 2.3 11/14/2022    Lab Results  Component Value Date   HGBA1C 5.0 01/05/2022     Micro Results Recent Results (from the past 240 hours)  Resp panel by RT-PCR (RSV, Flu A&B, Covid) Anterior Nasal Swab     Status: Abnormal   Collection Time: 02/10/23  3:30 AM   Specimen: Anterior Nasal Swab  Result Value Ref Range Status   SARS Coronavirus 2 by RT PCR NEGATIVE NEGATIVE Final   Influenza A by PCR POSITIVE (A) NEGATIVE Final   Influenza B by PCR NEGATIVE NEGATIVE Final    Comment: (NOTE) The Xpert Xpress SARS-CoV-2/FLU/RSV plus assay is intended as an aid in the diagnosis of influenza from Nasopharyngeal swab specimens and should not be used as a sole basis for treatment. Nasal washings and aspirates are unacceptable for Xpert Xpress SARS-CoV-2/FLU/RSV testing.  Fact Sheet for Patients: BloggerCourse.com  Fact Sheet for Healthcare  Providers: SeriousBroker.it  This test is not yet approved or cleared by the [place] FDA and has been authorized for detection and/or diagnosis of SARS-CoV-2 by FDA under an Emergency Use Authorization (EUA). This EUA will remain in effect (meaning this test can be used) for the duration of the COVID-19 declaration under Section 564(b)(1) of the Act, 21 U.S.C. section 360bbb-3(b)(1), unless the authorization is terminated or revoked.     Resp Syncytial Virus by PCR NEGATIVE NEGATIVE Final    Comment: (NOTE) Fact Sheet for Patients: BloggerCourse.com  Fact Sheet for Healthcare Providers: SeriousBroker.it  This test is not yet approved or cleared by the [place] FDA and has been authorized for detection and/or diagnosis of SARS-CoV-2 by FDA under an Emergency Use Authorization (EUA). This EUA will remain in effect (meaning this test can be used) for the duration of the COVID-19 declaration under Section 564(b)(1) of the Act, 21 U.S.C. section 360bbb-3(b)(1), unless the authorization is terminated or revoked.  Performed at [facility], [address] [place], [place] [address]   MRSA Next Gen by PCR, Nasal     Status: None   Collection Time: 02/14/23 11:46 AM   Specimen: Nasal Mucosa; Nasal Swab  Result Value Ref Range Status   MRSA by PCR Next Gen NOT DETECTED NOT DETECTED Final    Comment: (NOTE) The GeneXpert MRSA Assay (FDA approved for NASAL specimens only), is one component of a comprehensive MRSA colonization surveillance program. It is not  intended to diagnose MRSA infection nor to guide or monitor treatment for MRSA infections. Test performance is not FDA approved in patients less than 90 years old. Performed at [facility], [address] [place], [place] [address]   Culture, blood (Routine X 2) w Reflex to ID Panel     Status: None (Preliminary result)    Collection Time: 02/14/23  7:10 PM   Specimen: BLOOD RIGHT HAND  Result Value Ref Range Status   Specimen Description BLOOD RIGHT HAND  Final   Special Requests   Final    BOTTLES DRAWN AEROBIC AND ANAEROBIC Blood Culture results may not be optimal due to an inadequate volume of blood received in culture bottles   Culture   Final    NO GROWTH 4 DAYS Performed at [facility], [address] [place], [place] [address]    Report Status PENDING  Incomplete  Culture, blood (Routine X 2) w Reflex to ID Panel     Status: None (Preliminary result)   Collection Time: 02/14/23  7:11 PM   Specimen: BLOOD RIGHT ARM  Result Value Ref Range Status   Specimen Description BLOOD RIGHT ARM  Final   Special Requests   Final    BOTTLES DRAWN AEROBIC AND ANAEROBIC Blood Culture results may not be optimal due to an inadequate volume of blood received in culture bottles   Culture   Final    NO GROWTH 4 DAYS Performed at [facility], [address], [place], [place] [address]    Report Status PENDING  Incomplete    Radiology Reports DG Chest Port 1 View Result Date: 02/18/2023 CLINICAL DATA:  Shortness of breath EXAM: PORTABLE CHEST 1 VIEW COMPARISON:  02/16/2023 FINDINGS: Right IJ approach hemodialysis catheter remains in place. Stable cardiomegaly. Patchy airspace consolidation in the mid to lower left lung without significant interval change. Minimal bibasilar atelectasis. No new airspace consolidations. No pleural effusion or pneumothorax. IMPRESSION: Patchy airspace consolidation in the mid to lower left lung without significant interval change. Electronically Signed   By: Duanne Guess D.O.   On: 02/18/2023 08:38   DG CHEST PORT 1 VIEW Result Date: 02/16/2023 CLINICAL DATA:  Shortness of breath EXAM: PORTABLE CHEST 1 VIEW COMPARISON:  02/14/2023, CT 02/15/2023 FINDINGS: Right-sided central venous catheter tip at the right atrium. Low lung volumes. Improved aeration of the left mid to  lower lung with residual ground-glass opacity. Stable cardiomediastinal silhouette. No pneumothorax IMPRESSION: Improved aeration of the left mid to lower lung but with residual ground-glass opacity/possible pneumonia. Continued imaging follow-up to resolution is recommended. Electronically Signed   By: Jasmine Pang M.D.   On: 02/16/2023 22:46   CT CHEST ABDOMEN PELVIS WO CONTRAST Addendum Date: 02/15/2023 ADDENDUM REPORT: 02/15/2023 16:54 ADDENDUM: Additional finding: There is increased density and thickening along the left side anterior abdominal wall, rectus abdominus muscle. This could represent a muscle hematoma. Please see axial series 3, image 58. Please correlate with symptoms and recommend follow-up to confirm resolution to exclude underlying pathology. Critical Value/emergent results were called by telephone at the time of interpretation on 02/15/2023 at 4:44 pm to provider [facility] , who verbally acknowledged these results. Electronically Signed   By: Karen Kays M.D.   On: 02/15/2023 16:54   Result Date: 02/15/2023 CLINICAL DATA:  Lymphadenopathy.  Lung mass.  * Tracking Code: BO *. EXAM: CT CHEST, ABDOMEN AND PELVIS WITHOUT CONTRAST TECHNIQUE: Multidetector CT imaging of the chest, abdomen and pelvis was performed  following the standard protocol without IV contrast. RADIATION DOSE REDUCTION: This exam was performed according to the departmental dose-optimization program which includes automated exposure control, adjustment of the mA and/or kV according to patient size and/or use of iterative reconstruction technique. COMPARISON:  Renal stone CT and chest CT without contrast 11/07/2022. chest x-ray 02/14/2023. FINDINGS: CT CHEST FINDINGS Cardiovascular: Large-bore right IJ catheter in place with tip seen at extending down to the SVC right atrial junction region. Trace pericardial fluid. Heart is nonenlarged. Few coronary artery calcifications are seen. Please correlate for other coronary risk  factors. Grossly the thoracic aorta is normal course and caliber on this noncontrast exam. Mediastinum/Nodes: Normal caliber thoracic esophagus which is patulous. Preserved thyroid gland. No specific abnormal lymph node enlargement identified in the axillary region, hilum or mediastinum. Lungs/Pleura: Large area of consolidation with air bronchograms along the left upper lobe compared components of ground-glass interstitial septal thickening. There is also some patchy areas seen along both lower lobes as well. Minimal area in the middle lobe. These are new from 2024 October. Acute infiltrative process is possible such as pneumonia. Please correlate clinical presentation. Would recommend follow up after treatment to confirm resolution and exclude underlying pathology. No associated pneumothorax or effusion. There is some breathing motion. Musculoskeletal: Scattered degenerative changes of the thoracic spine. Stable lucent areas in the vertebral body at T10. Old left-sided rib fracture deformities. CT ABDOMEN PELVIS FINDINGS Hepatobiliary: On this non IV contrast exam, grossly preserved hepatic parenchyma. Stones in the nondilated gallbladder. Pancreas: Unremarkable. No pancreatic ductal dilatation or surrounding inflammatory changes. Spleen: Normal in size without focal abnormality. Adrenals/Urinary Tract: 11 mm left-sided adrenal nodule with Hounsfield units of less than 0 consistent with an adenoma, unchanged from previous. Right adrenal gland is preserved. No abnormal calcification seen within either kidney nor along the expected course of either ureter. Asymmetric wall thickening along a slightly urinary bladder, increased from previous. Please see series 3, image 99. A bladder lesion is not excluded. This could be aggressive. Please correlate with clinical presentation and follow-up evaluation. Stomach/Bowel: Air-fluid level along the nondilated stomach. Small bowel is nondilated. Normal caliber retrocecal  appendix. This is along the appendix. Large bowel overall has a normal caliber with scattered stool and scattered diverticula particularly along the left side of the colon. Redundant loops of colon are seen diffusely including sigmoid and transverse colon. Vascular/Lymphatic: Aortic atherosclerosis. No enlarged abdominal or pelvic lymph nodes. Reproductive: Uterus is absent. Once again there is a cystic lesion in the left adnexa anteriorly which appears to abut the left ovary. Previous is measured 7.3 x 6.8 cm and today 7.3 by 6.8 cm. By noncontrast CT this appears relatively simple. This been present since at least October 2021. That time this measured up to 7.6 cm. Multiple long-term stability. Other: No free air or free fluid. Musculoskeletal: Degenerative changes seen of the spine and pelvis. Once again there is deformity at L3-4 with listhesis and fusion. Patient has a clinical history of previous discitis. There is associated stenosis of the central canal at this level IMPRESSION: Multifocal consolidative opacities identified in the lungs, greatest in left upper lobe. Mild areas in the lower lobes and minimal in the middle lobe. Multifocal infiltrate or pneumonia is possible. Please correlate clinical presentation recommend follow-up to confirm resolution and exclude underlying secondary pathology. No specific developing abnormal lymph node enlargement in the chest, abdomen and pelvis. Gallstones.  Colonic diverticula. Stable left adrenal adenoma. Stable large cystic lesion in the left adnexa, likely ovarian  but has been stable since at least 2021 demonstrating long-term stability. New asymmetric wall thickening along left side of the urinary bladder. An aggressive lesion is in the differential recommend further workup when appropriate. Electronically Signed: By: Karen Kays M.D. On: 02/15/2023 13:40   DG CHEST PORT 1 VIEW Result Date: 02/14/2023 CLINICAL DATA:  Shortness of breath.  Left-sided chest pain.  EXAM: PORTABLE CHEST 1 VIEW COMPARISON:  11/07/2022. FINDINGS: There is new heterogeneous, at least 6 x 7.5 cm masslike consolidation overlying the left mid lower lung zones. Correlate clinically. Further evaluation with chest CT scan is recommended. Bilateral lungs are otherwise clear. No pulmonary edema. Bilateral costophrenic angles are clear. No pneumothorax. Stable cardio-mediastinal silhouette. No acute osseous abnormalities. The soft tissues are within normal limits. Right IJ hemodialysis catheter again seen with its tip overlying the cavoatrial junction region. IMPRESSION: There is a heterogeneous masslike consolidation overlying the left mid lower lung zones. Further evaluation with chest CT scan is recommended. Electronically Signed   By: Jules Schick M.D.   On: 02/14/2023 11:44      Signature  -   Susa Raring M.D on 02/18/2023 at 10:46 AM   -  To page go to [email or web address]

## 2023-02-19 DIAGNOSIS — J9601 Acute respiratory failure with hypoxia: Secondary | ICD-10-CM | POA: Diagnosis not present

## 2023-02-19 LAB — TYPE AND SCREEN
ABO/RH(D): O POS
Antibody Screen: NEGATIVE
Donor AG Type: NEGATIVE
Donor AG Type: NEGATIVE
Unit division: 0
Unit division: 0

## 2023-02-19 LAB — COMPREHENSIVE METABOLIC PANEL
ALT: 12 U/L (ref 0–44)
AST: 17 U/L (ref 15–41)
Albumin: 2.7 g/dL — ABNORMAL LOW (ref 3.5–5.0)
Alkaline Phosphatase: 121 U/L (ref 38–126)
Anion gap: 14 (ref 5–15)
BUN: 44 mg/dL — ABNORMAL HIGH (ref 8–23)
CO2: 25 mmol/L (ref 22–32)
Calcium: 9.5 mg/dL (ref 8.9–10.3)
Chloride: 88 mmol/L — ABNORMAL LOW (ref 98–111)
Creatinine, Ser: 4.16 mg/dL — ABNORMAL HIGH (ref 0.44–1.00)
GFR, Estimated: 11 mL/min — ABNORMAL LOW (ref >60)
Glucose, Bld: 90 mg/dL (ref 70–99)
Potassium: 3.7 mmol/L (ref 3.5–5.1)
Sodium: 127 mmol/L — ABNORMAL LOW (ref 135–145)
Total Bilirubin: 0.6 mg/dL (ref 0.0–1.2)
Total Protein: 7.1 g/dL (ref 6.5–8.1)

## 2023-02-19 LAB — CULTURE, BLOOD (ROUTINE X 2)
Culture: NO GROWTH
Culture: NO GROWTH

## 2023-02-19 LAB — BPAM RBC
Blood Product Expiration Date: 202502242359
Blood Product Expiration Date: 202502262359
ISSUE DATE / TIME: 202501290805
Unit Type and Rh: 5100
Unit Type and Rh: 5100

## 2023-02-19 LAB — CBC WITH DIFFERENTIAL/PLATELET
Abs Immature Granulocytes: 0.44 10*3/uL — ABNORMAL HIGH (ref 0.00–0.07)
Basophils Absolute: 0.1 10*3/uL (ref 0.0–0.1)
Basophils Relative: 1 %
Eosinophils Absolute: 0.2 10*3/uL (ref 0.0–0.5)
Eosinophils Relative: 2 %
HCT: 33.8 % — ABNORMAL LOW (ref 36.0–46.0)
Hemoglobin: 11.1 g/dL — ABNORMAL LOW (ref 12.0–15.0)
Immature Granulocytes: 5 %
Lymphocytes Relative: 15 %
Lymphs Abs: 1.3 10*3/uL (ref 0.7–4.0)
MCH: 29.6 pg (ref 26.0–34.0)
MCHC: 32.8 g/dL (ref 30.0–36.0)
MCV: 90.1 fL (ref 80.0–100.0)
Monocytes Absolute: 0.8 10*3/uL (ref 0.1–1.0)
Monocytes Relative: 10 %
Neutro Abs: 5.9 10*3/uL (ref 1.7–7.7)
Neutrophils Relative %: 67 %
Platelets: 406 10*3/uL — ABNORMAL HIGH (ref 150–400)
RBC: 3.75 MIL/uL — ABNORMAL LOW (ref 3.87–5.11)
RDW: 15.1 % (ref 11.5–15.5)
WBC: 8.7 10*3/uL (ref 4.0–10.5)
nRBC: 0 % (ref 0.0–0.2)

## 2023-02-19 LAB — PHOSPHORUS: Phosphorus: 4.8 mg/dL — ABNORMAL HIGH (ref 2.5–4.6)

## 2023-02-19 LAB — C-REACTIVE PROTEIN: CRP: 8.1 mg/dL — ABNORMAL HIGH (ref <1.0)

## 2023-02-19 LAB — MAGNESIUM: Magnesium: 1.7 mg/dL (ref 1.7–2.4)

## 2023-02-19 LAB — PROCALCITONIN: Procalcitonin: 0.11 ng/mL

## 2023-02-19 MED ORDER — ANTICOAGULANT SODIUM CITRATE 4% (200MG/5ML) IV SOLN
5.0000 mL | Status: DC | PRN
  Filled 2023-02-19: qty 5

## 2023-02-19 MED ORDER — CYCLOBENZAPRINE HCL 5 MG PO TABS: 10.0000 mg | ORAL_TABLET | Freq: Three times a day (TID) | ORAL | Status: DC | PRN

## 2023-02-19 MED ORDER — HEPARIN SODIUM (PORCINE) 1000 UNIT/ML IJ SOLN
3200.0000 [IU] | Freq: Once | INTRAMUSCULAR | Status: AC
  Administered 2023-02-19: 3200 [IU]
  Filled 2023-02-19: qty 4

## 2023-02-19 NOTE — Plan of Care (Signed)
  Problem: Education: Goal: Knowledge of General Education information will improve Description: Including pain rating scale, medication(s)/side effects and non-pharmacologic comfort measures Outcome: Progressing   Problem: Clinical Measurements: Goal: Will remain free from infection Outcome: Progressing Goal: Diagnostic test results will improve Outcome: Progressing   Problem: Activity: Goal: Risk for activity intolerance will decrease Outcome: Progressing   Problem: Nutrition: Goal: Adequate nutrition will be maintained Outcome: Progressing   Problem: Elimination: Goal: Will not experience complications related to bowel motility Outcome: Progressing Goal: Will not experience complications related to urinary retention Outcome: Progressing   Problem: Pain Managment: Goal: General experience of comfort will improve and/or be controlled Outcome: Progressing   Problem: Safety: Goal: Ability to remain free from injury will improve Outcome: Progressing   Problem: Health Behavior/Discharge Planning: Goal: Ability to manage health-related needs will improve Outcome: Not Progressing

## 2023-02-19 NOTE — Plan of Care (Signed)
   Problem: Education: Goal: Knowledge of General Education information will improve Description: Including pain rating scale, medication(s)/side effects and non-pharmacologic comfort measures Outcome: Progressing   Problem: Health Behavior/Discharge Planning: Goal: Ability to manage health-related needs will improve Outcome: Progressing   Problem: Clinical Measurements: Goal: Ability to maintain clinical measurements within normal limits will improve Outcome: Progressing   Problem: Activity: Goal: Risk for activity intolerance will decrease Outcome: Progressing   Problem: Nutrition: Goal: Adequate nutrition will be maintained Outcome: Progressing   Problem: Coping: Goal: Level of anxiety will decrease Outcome: Progressing   Problem: Elimination: Goal: Will not experience complications related to bowel motility Outcome: Progressing

## 2023-02-19 NOTE — Progress Notes (Signed)
Received patient in bed to unit.  Alert and oriented.  Informed consent signed and in chart.   TX duration: 4 hours  Patient tolerated well.  Transported back to the room  Alert, without acute distress.  Hand-off given to patient's nurse.   Access used: R internal jugular HD cath Access issues: A-V and V-A  Total UF removed: 2.5L Medication(s) given: Hydrocodone-Tylenol   02/19/23 1855  Vitals  Temp 98.5 F (36.9 C)  Temp Source Oral  BP 128/68  Pulse Rate 88  ECG Heart Rate 88  Resp (!) 26  Oxygen Therapy  SpO2 100 %  O2 Device Nasal Cannula  O2 Flow Rate (L/min) 3 L/min  Patient Activity (if Appropriate) In bed  Pulse Oximetry Type Continuous  During Treatment Monitoring  Duration of HD Treatment -hour(s) 3.97 hour(s)  Cumulative Fluid Removed (mL) per Treatment  2476.89  HD Safety Checks Performed Yes  Intra-Hemodialysis Comments Tx completed  Dialysis Fluid Bolus Normal Saline  Bolus Amount (mL) 300 mL  Post Treatment  Dialyzer Clearance Lightly streaked  Liters Processed 96  Fluid Removed (mL) 2.5 mL  Tolerated HD Treatment Yes  Hemodialysis Catheter Right Subclavian Double lumen Permanent (Tunneled)  Placement Date/Time: 10/16/22 (c)    Placed prior to admission: Yes  Orientation: Right  Access Location: Subclavian  Hemodialysis Catheter Type: Double lumen Permanent (Tunneled)  Site Condition No complications  Blue Lumen Status Flushed;Heparin locked;Dead end cap in place  Red Lumen Status Heparin locked;Dead end cap in place;Flushed  Purple Lumen Status N/A  Catheter fill solution Heparin 1000 units/ml  Catheter fill volume (Arterial) 1.6 cc  Catheter fill volume (Venous) 1.6  Dressing Type Transparent  Dressing Status Antimicrobial disc/dressing in place;Clean, Dry, Intact  Interventions Other (Comment) (deaccessed)  Drainage Description None  Dressing Change Due 02/21/23  Post treatment catheter status Capped and Clamped     Stacie Glaze  LPN [facility]

## 2023-02-19 NOTE — Progress Notes (Signed)
[facility] Progress Note   Subjective:    Seen and examined patient at bedside. Reports ongoing chest congestion with mild CP. On O2. Denies SOB. Plan for HD this afternoon/evening.  Objective Vitals:   02/18/23 2010 02/18/23 2058 02/19/23 0000 02/19/23 0333  BP: (!) 156/92 (!) 156/92 (!) 137/90 (!) 146/93  Pulse: 88 81 82 78  Resp: 18  17 16   Temp: 98.4 F (36.9 C)  97.9 F (36.6 C) 98.5 F (36.9 C)  TempSrc: Oral  Oral Oral  SpO2: 99%  98% 99%  Weight:      Height:       Physical Exam General: Chronically ill female in NAD Heart: S1,S2 RRR  Lungs: CTAB A/P slightly decreased in bases No WOB Abdomen: obese NABS Extremities: no LE edema Dialysis Access: R TDC drsg intact  Filed Weights   02/16/23 0632 02/17/23 0158 02/17/23 0605  Weight: 86.7 kg 86.3 kg 82.6 kg    Intake/Output Summary (Last 24 hours) at 02/19/2023 1416 Last data filed at 02/19/2023 0900 Gross per 24 hour  Intake 483 ml  Output 40981 ml  Net -9517 ml    Additional Objective Labs: Basic Metabolic Panel: Recent Labs  Lab 02/17/23 0614 02/18/23 0526 02/19/23 0444  NA 132* 127* 127*  K 3.2* 3.3* 3.7  CL 91* 89* 88*  CO2 25 25 25   GLUCOSE 85 95 90  BUN 14 32* 44*  CREATININE 2.26* 3.59* 4.16*  CALCIUM 9.0 9.2 9.5  PHOS 2.0* 4.7* 4.8*   Liver Function Tests: Recent Labs  Lab 02/17/23 0614 02/18/23 0526 02/19/23 0444  AST 24 18 17   ALT 15 13 12   ALKPHOS 122 112 121  BILITOT 0.6 0.4 0.6  PROT 7.4 6.9 7.1  ALBUMIN 2.8* 2.7* 2.7*   No results for input(s): "LIPASE", "AMYLASE" in the last 168 hours. CBC: Recent Labs  Lab 02/15/23 0517 02/16/23 0531 02/17/23 0614 02/18/23 0526 02/19/23 0444  WBC 9.2 9.7 10.4 8.8 8.7  NEUTROABS  --  6.9 7.8* 5.8 5.9  HGB 10.1* 10.9* 11.0* 10.6* 11.1*  HCT 32.1* 33.4* 34.5* 33.5* 33.8*  MCV 92.5 90.5 90.1 91.3 90.1  PLT 354 374 362 380 406*   Blood Culture    Component Value Date/Time   SDES BLOOD RIGHT ARM 02/14/2023 1911    SPECREQUEST  02/14/2023 1911    BOTTLES DRAWN AEROBIC AND ANAEROBIC Blood Culture results may not be optimal due to an inadequate volume of blood received in culture bottles   CULT  02/14/2023 1911    NO GROWTH 5 DAYS Performed at [facility], [address] [place], [place] [address]    REPTSTATUS 02/19/2023 FINAL 02/14/2023 1911    Cardiac Enzymes: No results for input(s): "CKTOTAL", "CKMB", "CKMBINDEX", "TROPONINI" in the last 168 hours. CBG: No results for input(s): "GLUCAP" in the last 168 hours. Iron Studies: No results for input(s): "IRON", "TIBC", "TRANSFERRIN", "FERRITIN" in the last 72 hours. Lab Results  Component Value Date   INR 1.2 02/15/2023   INR 1.2 03/02/2022   INR 1.0 04/25/2021   Studies/Results: DG Chest Port 1 View Result Date: 02/18/2023 CLINICAL DATA:  Shortness of breath EXAM: PORTABLE CHEST 1 VIEW COMPARISON:  02/16/2023 FINDINGS: Right IJ approach hemodialysis catheter remains in place. Stable cardiomegaly. Patchy airspace consolidation in the mid to lower left lung without significant interval change. Minimal bibasilar atelectasis. No new airspace consolidations. No pleural effusion or pneumothorax. IMPRESSION: Patchy airspace consolidation in the mid to lower left lung without  significant interval change. Electronically Signed   By: Duanne Guess D.O.   On: 02/18/2023 08:38    Medications:  anticoagulant sodium citrate      atorvastatin  20 mg Oral Daily   calcitRIOL  0.25 mcg Oral Q M,W,F   carvedilol  3.125 mg Oral BID   Chlorhexidine Gluconate Cloth  6 each Topical Q0600   dextromethorphan-guaiFENesin  1 tablet Oral BID   doxycycline  100 mg Oral Q12H   DULoxetine  30 mg Oral Daily   escitalopram  20 mg Oral Daily   ezetimibe  10 mg Oral Daily   fluticasone furoate-vilanterol  1 puff Inhalation Daily   And   umeclidinium bromide  1 puff Inhalation Daily   lidocaine  1 patch Transdermal Q24H   lidocaine  1 patch Transdermal Q24H    mirtazapine  15 mg Oral QHS   oseltamivir  30 mg Oral Q M,W,F-2000   pantoprazole  40 mg Oral Daily   QUEtiapine  100 mg Oral Daily   QUEtiapine  200 mg Oral QHS   sodium chloride flush  3 mL Intravenous Q12H   torsemide  60 mg Oral BID    Dialysis Orders: Outpatient dialysis unit: [facility] Outpatient dialysis prescription: BFR 500, 3K, EDW 89kg, 4hr, 2ca, TDC, mircera given 1/27, on IV iron, calcitriol 0.67mcg with each treatment   Assessment/Plan: # ESRD:  Last HD 2/1 with net UF 2.5L. Next HD this afternoon/evening to maintain MWF schedule.   # Volume/ hypertension: EDW 89kg. BP fairly low outpatient. Noted Amlodipine was stopped and Coreg lowered.  Chronic volume overload and persistent today.  Ultrafiltration as tolerated. Torsemide raised to 60mg  BID. CXR yesterday showed patchy airspace consolidation in mid to lower left lobe.   # Anemia of Chronic Kidney Disease: Hemoglobin 10.1. No iv iron for now. ESA when due   # Secondary Hyperparathyroidism/Hyperphosphatemia: Cont calcitriol. Very low Po4. Rx Kphos 30 mmol IV over 6 hours per primary. Follow labs.    # Vascular access: TDC with no issues   # Hypokalemia: Replace as needed.  High K bath   # Influenza infection: Management per primary team   # Additional recommendations: - Dose all meds for creatinine clearance < 10 ml/min  - Unless absolutely necessary, no MRIs with gadolinium.  - Implement save arm precautions.  Prefer needle sticks in the dorsum of the hands or wrists.  No blood pressure measurements in arm. - If blood transfusion is requested during hemodialysis sessions, please alert [name] prior to the session.  - Use synthetic opioids (Fentanyl/Dilaudid) if needed  Salome Holmes, NP [facility] 02/19/2023,2:16 PM  LOS: 5 days

## 2023-02-19 NOTE — Progress Notes (Signed)
Physical Therapy Treatment Patient Details Name: Tiffany [name] MRN: [id number] DOB: [birth year] Today's Date: 02/19/2023   History of Present Illness 63 y.o. female admitted 02/13/23 with cough, fever, chest and abdominal pain.  Noted positive for influenza A and CXR positive for L lower lobe infiltrate concern for bacterial pneumonia.  PMH ESRD on HD MWF, obesity, OSA, HTN, CHF, COPD, DM2, CKD, PTSD, gout, seizures, CVA, anoxic brain injury, spinal stenosis, peripheral neuropathy, schizophrenia/schizoaffective disorder, CAD s/p MI x2, HLD, multiple falls, and drug abuse (cocaine).    PT Comments  Pt received in supine and agreeable to session. Pt able to perform bed mobility without assist, however requires increased time and effort due to weakness and quick fatigue. Pt able to tolerate 2 standing trials and weight shifting, however demonstrates limited standing tolerance due to quick fatigue. Pt demonstrates 1 LOB during first standing trial requiring min A to correct. Pt with increased anxiety during standing trials due to unsteadiness. Pt continues to benefit from PT services to progress toward functional mobility goals.    If plan is discharge home, recommend the following: A little help with walking and/or transfers;Assist for transportation   Can travel by private vehicle        Equipment Recommendations  None recommended by PT    Recommendations for Other Services       Precautions / Restrictions Precautions Precautions: Fall Precaution Comments: reports frequent falls Restrictions Weight Bearing Restrictions Per Provider Order: No     Mobility  Bed Mobility Overal bed mobility: Needs Assistance Bed Mobility: Supine to Sit, Sit to Supine     Supine to sit: Contact guard, HOB elevated Sit to supine: Contact guard assist   General bed mobility comments: increased time and effort, but no physical assist needed. increased difficulty elevating BLE back to EOB     Transfers Overall transfer level: Needs assistance Equipment used: Rolling walker (2 wheels) Transfers: Sit to/from Stand Sit to Stand: Min assist, Contact guard assist           General transfer comment: STS from EOB x2 with min A progressing to CGA. 1 LOB upon initial stand requiring min A to correct. Cues for hand placement        Balance Overall balance assessment: Needs assistance Sitting-balance support: Feet supported, No upper extremity supported Sitting balance-Leahy Scale: Good Sitting balance - Comments: sitting EOB   Standing balance support: Bilateral upper extremity supported, Reliant on assistive device for balance Standing balance-Leahy Scale: Poor Standing balance comment: with RW support                            Cognition Arousal: Alert Behavior During Therapy: WFL for tasks assessed/performed Overall Cognitive Status: History of cognitive impairments - at baseline                                          Exercises General Exercises - Lower Extremity Long Arc Quad: AROM, Seated, Both, 10 reps    General Comments        Pertinent Vitals/Pain Pain Assessment Pain Assessment: Faces Faces Pain Scale: Hurts a little bit Pain Location: L flank Pain Descriptors / Indicators: Tingling, Aching ("fluttering") Pain Intervention(s): Limited activity within patient's tolerance, Monitored during session, Repositioned     PT Goals (current goals can now be found in the care plan section) Acute  Rehab PT Goals Patient Stated Goal: to return home PT Goal Formulation: With patient Time For Goal Achievement: 03/01/23 Progress towards PT goals: Progressing toward goals    Frequency    Min 1X/week       AM-PAC PT "6 Clicks" Mobility   Outcome Measure  Help needed turning from your back to your side while in a flat bed without using bedrails?: A Little Help needed moving from lying on your back to sitting on the side  of a flat bed without using bedrails?: A Little Help needed moving to and from a bed to a chair (including a wheelchair)?: A Little Help needed standing up from a chair using your arms (e.g., wheelchair or bedside chair)?: A Little Help needed to walk in hospital room?: Total Help needed climbing 3-5 steps with a railing? : Total 6 Click Score: 14    End of Session Equipment Utilized During Treatment: Oxygen;Gait belt Activity Tolerance: Patient limited by fatigue;Patient tolerated treatment well Patient left: with call bell/phone within reach;in bed (with transport present) Nurse Communication: Mobility status PT Visit Diagnosis: Muscle weakness (generalized) (M62.81);History of falling (Z91.81);Other abnormalities of gait and mobility (R26.89)     Time: 1610-9604 PT Time Calculation (min) (ACUTE ONLY): 19 min  Charges:    $Therapeutic Activity: 8-22 mins PT General Charges $$ ACUTE PT VISIT: 1 Visit                     Johny Shock, PTA Acute Rehabilitation Services Secure Chat Preferred  [phone]   Johny Shock 02/19/2023, 2:43 PM

## 2023-02-19 NOTE — Progress Notes (Addendum)
PROGRESS NOTE                                                                                                                                                                                                             Patient Demographics:    Tiffany [name], is a 63 y.o. female, DOB - 1960/02/24, BJY:[id number]  Outpatient Primary MD for the patient is [name], MD    LOS - 5  Admit date - 02/13/2023    Chief Complaint  Patient presents with   Cough   Generalized Body Aches       Brief Narrative (HPI from H&P)    63 y.o. female with medical history significant of end-stage renal disease on dialysis Monday Wednesday Friday.  Patient reports being typically wheelchair-bound due to paraparesis due to neuropathy.  Patient seems to have been in her usual state of health till around January 23, 6 days ago when she developed a new onset cough.  Patient was actually evaluated in the ER on January 25.  Patient was found to have fever 101 F at the time.  Patient was diagnosed with influenza A and discharged home at the time in reasonable condition.  Patient returned yesterday with complaints of chest pain with coughing, as well as abdominal pain during coughing spells, diagnosed with acute hypoxic respiratory failure due to influenza A pneumonia there was also concern for lung mass and she was admitted for further care.   Subjective:   Patient in bed, appears comfortable, denies any headache, no fever, no chest pain or pressure, no shortness of breath , no abdominal pain. No new focal weakness.    Assessment  & Plan :   Acute hypoxic respiratory failure (HCC) due to influenza A pneumonia.  Also question of left lung mass.  Could have postobstructive pneumonia due to that. She has been placed on Tamiflu with good improvement, continue supplemental oxygen, at home she uses 2 to 3 L nasal cannula oxygen intermittently, use I-S and  flutter valve for pulmonary toiletry, advance activity, titrate down oxygen, CT chest stable.  De-escalate antibiotics to doxycycline and monitor cultures.  Schizophrenia (HCC) Continue Cymbalta Lexapro Remeron and Seroquel.  Frequent falls - Patient has chronic paraparesis apparently due to neuropathy.  Patient wheelchair dependent at home.  Note patient is extremely weak and frail qualifies for SNF  but refuses to go to SNF, adamant on being discharged home, have told her that her risk of readmission is very high, however she is vehemently refusing placement.  Chest pain due to cough, supportive care and monitor.  Stable EKG, pain currently resolved with supportive care.  Single troponin borderline, of no significance clinically.  Subsequent troponins stable.  EKG remains unremarkable.  Lidoderm patch provided, pain is due to sustained cough and intercostal muscle strain.  ESRD - on MWF schedule.  Nephrology on board.  Obesity: BMI of 34.8.  Follow-up with PCP for weight loss.  Mild left-sided flank/abdominal pain due to heparin injection site hematoma.  Monitor, heparin discontinued, placed on SCDs.  Will continue to monitor type screen done.      Condition - Extremely Guarded  Family Communication  : Called daughter [name] [phone]  on 02/15/2023 at 10:08 AM and message left, called again 02/19/2023 at 10:38 AM and message left  Code Status :  Full  Consults  :  Renal  PUD Prophylaxis : PPI   Procedures  :      CT -  Multifocal consolidative opacities identified in the lungs, greatest in left upper lobe. Mild areas in the lower lobes and minimal in the middle lobe. Multifocal infiltrate or pneumonia is possible. Please correlate clinical presentation recommend follow-up to confirm resolution and exclude underlying secondary pathology. No specific developing abnormal lymph node enlargement in the chest, abdomen and pelvis. Gallstones.  Colonic diverticula. Stable left adrenal adenoma.  Stable large cystic lesion in the left adnexa, likely ovarian but has been stable since at least 2021 demonstrating long-term stability. New asymmetric wall thickening along left side of the urinary bladder. An aggressive lesion is in the differential recommend further workup when appropriate.  There is increased density and thickening along the left side anterior abdominal wall, rectus abdominus muscle. This could represent a muscle hematoma. Please see axial series 3, image 58. Please correlate with symptoms and recommend follow-up to confirm resolution to exclude underlying pathology.      Disposition Plan  :    Status is: Inpatient   DVT Prophylaxis  :    SCDs Start: 02/14/23 0744    Lab Results  Component Value Date   PLT 406 (H) 02/19/2023    Diet :  Diet Order             Diet regular Room service appropriate? Yes; Fluid consistency: Thin; Fluid restriction: 1800 mL Fluid  Diet effective now                    Inpatient Medications  Scheduled Meds:  atorvastatin  20 mg Oral Daily   calcitRIOL  0.25 mcg Oral Q M,W,F   carvedilol  3.125 mg Oral BID   Chlorhexidine Gluconate Cloth  6 each Topical Q0600   dextromethorphan-guaiFENesin  1 tablet Oral BID   doxycycline  100 mg Oral Q12H   DULoxetine  30 mg Oral Daily   escitalopram  20 mg Oral Daily   ezetimibe  10 mg Oral Daily   fluticasone furoate-vilanterol  1 puff Inhalation Daily   And   umeclidinium bromide  1 puff Inhalation Daily   lidocaine  1 patch Transdermal Q24H   lidocaine  1 patch Transdermal Q24H   mirtazapine  15 mg Oral QHS   oseltamivir  30 mg Oral Q M,W,F-2000   pantoprazole  40 mg Oral Daily   QUEtiapine  100 mg Oral Daily   QUEtiapine  200 mg  Oral QHS   sodium chloride flush  3 mL Intravenous Q12H   torsemide  60 mg Oral BID   Continuous Infusions:    PRN Meds:.acetaminophen **OR** acetaminophen, albuterol, alteplase, heparin, HYDROcodone-acetaminophen, lidocaine (PF),  lidocaine-prilocaine, ondansetron (ZOFRAN) IV, pentafluoroprop-tetrafluoroeth, polyethylene glycol     Objective:   Vitals:   02/18/23 2010 02/18/23 2058 02/19/23 0000 02/19/23 0333  BP: (!) 156/92 (!) 156/92 (!) 137/90 (!) 146/93  Pulse: 88 81 82 78  Resp: 18  17 16   Temp: 98.4 F (36.9 C)  97.9 F (36.6 C) 98.5 F (36.9 C)  TempSrc: Oral  Oral Oral  SpO2: 99%  98% 99%  Weight:      Height:        Wt Readings from Last 3 Encounters:  02/17/23 82.6 kg  02/10/23 88.5 kg  01/30/23 88.5 kg     Intake/Output Summary (Last 24 hours) at 02/19/2023 1036 Last data filed at 02/19/2023 0447 Gross per 24 hour  Intake 243 ml  Output 10000 ml  Net -9757 ml     Physical Exam  Awake Alert, No new F.N deficits, Normal affect [place].AT,PERRAL Supple Neck, No JVD,   Symmetrical Chest wall movement, Good air movement bilaterally, CTAB RRR,No Gallops,Rubs or new Murmurs,  +ve B.Sounds, Abd Soft, mild left flank and lower quadrant tenderness No Cyanosis, Clubbing or edema        Data Review:    Recent Labs  Lab 02/13/23 1957 02/14/23 0828 02/15/23 0517 02/16/23 0531 02/17/23 0614 02/18/23 0526 02/19/23 0444  WBC 8.7   < > 9.2 9.7 10.4 8.8 8.7  HGB 9.9*   < > 10.1* 10.9* 11.0* 10.6* 11.1*  HCT 31.1*   < > 32.1* 33.4* 34.5* 33.5* 33.8*  PLT 340   < > 354 374 362 380 406*  MCV 91.5   < > 92.5 90.5 90.1 91.3 90.1  MCH 29.1   < > 29.1 29.5 28.7 28.9 29.6  MCHC 31.8   < > 31.5 32.6 31.9 31.6 32.8  RDW 15.2   < > 15.2 15.2 15.0 15.1 15.1  LYMPHSABS 1.5  --   --  1.3 0.9 1.4 1.3  MONOABS 0.8  --   --  1.2* 1.2* 1.0 0.8  EOSABS 0.2  --   --  0.1 0.2 0.2 0.2  BASOSABS 0.0  --   --  0.0 0.1 0.0 0.1   < > = values in this interval not displayed.    Recent Labs  Lab 02/14/23 0828 02/15/23 0517 02/16/23 0531 02/17/23 0200 02/17/23 0614 02/18/23 0526 02/19/23 0444  NA 130* 131*  --  130* 132* 127* 127*  K 3.0* 3.4*  --  2.9* 3.2* 3.3* 3.7  CL 91* 92*  --  86* 91* 89* 88*   CO2 23 28  --  27 25 25 25   ANIONGAP 16* 11  --  17* 16* 13 14  GLUCOSE 95 99  --  95 85 95 90  BUN 39* 19  --  38* 14 32* 44*  CREATININE 4.19* 3.03*  --  4.01* 2.26* 3.59* 4.16*  AST 29  --   --   --  24 18 17   ALT 14  --   --   --  15 13 12   ALKPHOS 110  --   --   --  122 112 121  BILITOT 0.6  --   --   --  0.6 0.4 0.6  ALBUMIN 2.9*  --   --  2.5* 2.8* 2.7* 2.7*  CRP  --   --   --   --  17.6* 11.9* 8.1*  PROCALCITON  --   --   --   --  0.17 0.14 0.11  INR  --  1.2  --   --   --   --   --   MG  --   --  1.5*  --  2.2 1.9 1.7  PHOS 3.5  --   --  3.7 2.0* 4.7* 4.8*  CALCIUM 8.6* 8.5*  --  9.5 9.0 9.2 9.5      Recent Labs  Lab 02/15/23 0517 02/16/23 0531 02/17/23 0200 02/17/23 0614 02/18/23 0526 02/19/23 0444  CRP  --   --   --  17.6* 11.9* 8.1*  PROCALCITON  --   --   --  0.17 0.14 0.11  INR 1.2  --   --   --   --   --   MG  --  1.5*  --  2.2 1.9 1.7  CALCIUM 8.5*  --  9.5 9.0 9.2 9.5    --------------------------------------------------------------------------------------------------------------- Lab Results  Component Value Date   CHOL 151 11/14/2022   HDL 65 11/14/2022   LDLCALC 66 11/14/2022   LDLDIRECT 52 04/05/2012   TRIG 100 11/14/2022   CHOLHDL 2.3 11/14/2022    Lab Results  Component Value Date   HGBA1C 5.0 01/05/2022     Micro Results Recent Results (from the past 240 hours)  Resp panel by RT-PCR (RSV, Flu A&B, Covid) Anterior Nasal Swab     Status: Abnormal   Collection Time: 02/10/23  3:30 AM   Specimen: Anterior Nasal Swab  Result Value Ref Range Status   SARS Coronavirus 2 by RT PCR NEGATIVE NEGATIVE Final   Influenza A by PCR POSITIVE (A) NEGATIVE Final   Influenza B by PCR NEGATIVE NEGATIVE Final    Comment: (NOTE) The Xpert Xpress SARS-CoV-2/FLU/RSV plus assay is intended as an aid in the diagnosis of influenza from Nasopharyngeal swab specimens and should not be used as a sole basis for treatment. Nasal washings and aspirates are  unacceptable for Xpert Xpress SARS-CoV-2/FLU/RSV testing.  Fact Sheet for Patients: BloggerCourse.com  Fact Sheet for Healthcare Providers: SeriousBroker.it  This test is not yet approved or cleared by the [place] FDA and has been authorized for detection and/or diagnosis of SARS-CoV-2 by FDA under an Emergency Use Authorization (EUA). This EUA will remain in effect (meaning this test can be used) for the duration of the COVID-19 declaration under Section 564(b)(1) of the Act, 21 U.S.C. section 360bbb-3(b)(1), unless the authorization is terminated or revoked.     Resp Syncytial Virus by PCR NEGATIVE NEGATIVE Final    Comment: (NOTE) Fact Sheet for Patients: BloggerCourse.com  Fact Sheet for Healthcare Providers: SeriousBroker.it  This test is not yet approved or cleared by the [place] FDA and has been authorized for detection and/or diagnosis of SARS-CoV-2 by FDA under an Emergency Use Authorization (EUA). This EUA will remain in effect (meaning this test can be used) for the duration of the COVID-19 declaration under Section 564(b)(1) of the Act, 21 U.S.C. section 360bbb-3(b)(1), unless the authorization is terminated or revoked.  Performed at [facility], [address], [place], [place] [address]   MRSA Next Gen by PCR, Nasal     Status: None   Collection Time: 02/14/23 11:46 AM   Specimen: Nasal Mucosa; Nasal Swab  Result Value Ref Range Status   MRSA by  PCR Next Gen NOT DETECTED NOT DETECTED Final    Comment: (NOTE) The GeneXpert MRSA Assay (FDA approved for NASAL specimens only), is one component of a comprehensive MRSA colonization surveillance program. It is not intended to diagnose MRSA infection nor to guide or monitor treatment for MRSA infections. Test performance is not FDA approved in patients less than 29 years old. Performed at [facility], [address], [place], [place] [address]   Culture, blood (Routine X 2) w Reflex to ID Panel     Status: None   Collection Time: 02/14/23  7:10 PM   Specimen: BLOOD RIGHT HAND  Result Value Ref Range Status   Specimen Description BLOOD RIGHT HAND  Final   Special Requests   Final    BOTTLES DRAWN AEROBIC AND ANAEROBIC Blood Culture results may not be optimal due to an inadequate volume of blood received in culture bottles   Culture   Final    NO GROWTH 5 DAYS Performed at [facility], [address], [place], [place] [address]    Report Status 02/19/2023 FINAL  Final  Culture, blood (Routine X 2) w Reflex to ID Panel     Status: None   Collection Time: 02/14/23  7:11 PM   Specimen: BLOOD RIGHT ARM  Result Value Ref Range Status   Specimen Description BLOOD RIGHT ARM  Final   Special Requests   Final    BOTTLES DRAWN AEROBIC AND ANAEROBIC Blood Culture results may not be optimal due to an inadequate volume of blood received in culture bottles   Culture   Final    NO GROWTH 5 DAYS Performed at [facility], [address], [place], [place] [address]    Report Status 02/19/2023 FINAL  Final    Radiology Reports DG Chest Port 1 View Result Date: 02/18/2023 CLINICAL DATA:  Shortness of breath EXAM: PORTABLE CHEST 1 VIEW COMPARISON:  02/16/2023 FINDINGS: Right IJ approach hemodialysis catheter remains in place. Stable cardiomegaly. Patchy airspace consolidation in the mid to lower left lung without significant interval change. Minimal bibasilar atelectasis. No new airspace consolidations. No pleural effusion or pneumothorax. IMPRESSION: Patchy airspace consolidation in the mid to lower left lung without significant interval change. Electronically Signed   By: Duanne Guess D.O.   On: 02/18/2023 08:38   DG CHEST PORT 1 VIEW Result Date: 02/16/2023 CLINICAL DATA:  Shortness of breath EXAM: PORTABLE CHEST 1 VIEW COMPARISON:  02/14/2023, CT 02/15/2023 FINDINGS:  Right-sided central venous catheter tip at the right atrium. Low lung volumes. Improved aeration of the left mid to lower lung with residual ground-glass opacity. Stable cardiomediastinal silhouette. No pneumothorax IMPRESSION: Improved aeration of the left mid to lower lung but with residual ground-glass opacity/possible pneumonia. Continued imaging follow-up to resolution is recommended. Electronically Signed   By: Jasmine Pang M.D.   On: 02/16/2023 22:46   CT CHEST ABDOMEN PELVIS WO CONTRAST Addendum Date: 02/15/2023 ADDENDUM REPORT: 02/15/2023 16:54 ADDENDUM: Additional finding: There is increased density and thickening along the left side anterior abdominal wall, rectus abdominus muscle. This could represent a muscle hematoma. Please see axial series 3, image 58. Please correlate with symptoms and recommend follow-up to confirm resolution to exclude underlying pathology. Critical Value/emergent results were called by telephone at the time of interpretation on 02/15/2023 at 4:44 pm to provider [facility] , who verbally acknowledged these results. Electronically Signed   By: Karen Kays M.D.   On: 02/15/2023 16:54   Result Date:  02/15/2023 CLINICAL DATA:  Lymphadenopathy.  Lung mass.  * Tracking Code: BO *. EXAM: CT CHEST, ABDOMEN AND PELVIS WITHOUT CONTRAST TECHNIQUE: Multidetector CT imaging of the chest, abdomen and pelvis was performed following the standard protocol without IV contrast. RADIATION DOSE REDUCTION: This exam was performed according to the departmental dose-optimization program which includes automated exposure control, adjustment of the mA and/or kV according to patient size and/or use of iterative reconstruction technique. COMPARISON:  Renal stone CT and chest CT without contrast 11/07/2022. chest x-ray 02/14/2023. FINDINGS: CT CHEST FINDINGS Cardiovascular: Large-bore right IJ catheter in place with tip seen at extending down to the SVC right atrial junction region. Trace  pericardial fluid. Heart is nonenlarged. Few coronary artery calcifications are seen. Please correlate for other coronary risk factors. Grossly the thoracic aorta is normal course and caliber on this noncontrast exam. Mediastinum/Nodes: Normal caliber thoracic esophagus which is patulous. Preserved thyroid gland. No specific abnormal lymph node enlargement identified in the axillary region, hilum or mediastinum. Lungs/Pleura: Large area of consolidation with air bronchograms along the left upper lobe compared components of ground-glass interstitial septal thickening. There is also some patchy areas seen along both lower lobes as well. Minimal area in the middle lobe. These are new from 2024 October. Acute infiltrative process is possible such as pneumonia. Please correlate clinical presentation. Would recommend follow up after treatment to confirm resolution and exclude underlying pathology. No associated pneumothorax or effusion. There is some breathing motion. Musculoskeletal: Scattered degenerative changes of the thoracic spine. Stable lucent areas in the vertebral body at T10. Old left-sided rib fracture deformities. CT ABDOMEN PELVIS FINDINGS Hepatobiliary: On this non IV contrast exam, grossly preserved hepatic parenchyma. Stones in the nondilated gallbladder. Pancreas: Unremarkable. No pancreatic ductal dilatation or surrounding inflammatory changes. Spleen: Normal in size without focal abnormality. Adrenals/Urinary Tract: 11 mm left-sided adrenal nodule with Hounsfield units of less than 0 consistent with an adenoma, unchanged from previous. Right adrenal gland is preserved. No abnormal calcification seen within either kidney nor along the expected course of either ureter. Asymmetric wall thickening along a slightly urinary bladder, increased from previous. Please see series 3, image 99. A bladder lesion is not excluded. This could be aggressive. Please correlate with clinical presentation and follow-up  evaluation. Stomach/Bowel: Air-fluid level along the nondilated stomach. Small bowel is nondilated. Normal caliber retrocecal appendix. This is along the appendix. Large bowel overall has a normal caliber with scattered stool and scattered diverticula particularly along the left side of the colon. Redundant loops of colon are seen diffusely including sigmoid and transverse colon. Vascular/Lymphatic: Aortic atherosclerosis. No enlarged abdominal or pelvic lymph nodes. Reproductive: Uterus is absent. Once again there is a cystic lesion in the left adnexa anteriorly which appears to abut the left ovary. Previous is measured 7.3 x 6.8 cm and today 7.3 by 6.8 cm. By noncontrast CT this appears relatively simple. This been present since at least October 2021. That time this measured up to 7.6 cm. Multiple long-term stability. Other: No free air or free fluid. Musculoskeletal: Degenerative changes seen of the spine and pelvis. Once again there is deformity at L3-4 with listhesis and fusion. Patient has a clinical history of previous discitis. There is associated stenosis of the central canal at this level IMPRESSION: Multifocal consolidative opacities identified in the lungs, greatest in left upper lobe. Mild areas in the lower lobes and minimal in the middle lobe. Multifocal infiltrate or pneumonia is possible. Please correlate clinical presentation recommend follow-up to confirm resolution and exclude  underlying secondary pathology. No specific developing abnormal lymph node enlargement in the chest, abdomen and pelvis. Gallstones.  Colonic diverticula. Stable left adrenal adenoma. Stable large cystic lesion in the left adnexa, likely ovarian but has been stable since at least 2021 demonstrating long-term stability. New asymmetric wall thickening along left side of the urinary bladder. An aggressive lesion is in the differential recommend further workup when appropriate. Electronically Signed: By: Karen Kays M.D. On:  02/15/2023 13:40      Signature  -   Susa Raring M.D on 02/19/2023 at 10:36 AM   -  To page go to [email or web address]

## 2023-02-20 ENCOUNTER — Other Ambulatory Visit: Payer: Self-pay

## 2023-02-20 ENCOUNTER — Other Ambulatory Visit (HOSPITAL_COMMUNITY): Payer: Self-pay

## 2023-02-20 ENCOUNTER — Ambulatory Visit: Payer: HMO

## 2023-02-20 ENCOUNTER — Encounter (HOSPITAL_COMMUNITY): Payer: Self-pay

## 2023-02-20 DIAGNOSIS — J9601 Acute respiratory failure with hypoxia: Secondary | ICD-10-CM | POA: Diagnosis not present

## 2023-02-20 LAB — COMPREHENSIVE METABOLIC PANEL
ALT: 15 U/L (ref 0–44)
AST: 22 U/L (ref 15–41)
Albumin: 2.8 g/dL — ABNORMAL LOW (ref 3.5–5.0)
Alkaline Phosphatase: 118 U/L (ref 38–126)
Anion gap: 14 (ref 5–15)
BUN: 24 mg/dL — ABNORMAL HIGH (ref 8–23)
CO2: 27 mmol/L (ref 22–32)
Calcium: 9.6 mg/dL (ref 8.9–10.3)
Chloride: 93 mmol/L — ABNORMAL LOW (ref 98–111)
Creatinine, Ser: 3.03 mg/dL — ABNORMAL HIGH (ref 0.44–1.00)
GFR, Estimated: 17 mL/min — ABNORMAL LOW (ref >60)
Glucose, Bld: 96 mg/dL (ref 70–99)
Potassium: 3.7 mmol/L (ref 3.5–5.1)
Sodium: 134 mmol/L — ABNORMAL LOW (ref 135–145)
Total Bilirubin: 0.6 mg/dL (ref 0.0–1.2)
Total Protein: 7.3 g/dL (ref 6.5–8.1)

## 2023-02-20 LAB — CBC WITH DIFFERENTIAL/PLATELET
Abs Immature Granulocytes: 0.42 10*3/uL — ABNORMAL HIGH (ref 0.00–0.07)
Basophils Absolute: 0.1 10*3/uL (ref 0.0–0.1)
Basophils Relative: 1 %
Eosinophils Absolute: 0.2 10*3/uL (ref 0.0–0.5)
Eosinophils Relative: 2 %
HCT: 36.3 % (ref 36.0–46.0)
Hemoglobin: 11.5 g/dL — ABNORMAL LOW (ref 12.0–15.0)
Immature Granulocytes: 3 %
Lymphocytes Relative: 12 %
Lymphs Abs: 1.5 10*3/uL (ref 0.7–4.0)
MCH: 29 pg (ref 26.0–34.0)
MCHC: 31.7 g/dL (ref 30.0–36.0)
MCV: 91.4 fL (ref 80.0–100.0)
Monocytes Absolute: 1 10*3/uL (ref 0.1–1.0)
Monocytes Relative: 8 %
Neutro Abs: 9.2 10*3/uL — ABNORMAL HIGH (ref 1.7–7.7)
Neutrophils Relative %: 74 %
Platelets: 425 10*3/uL — ABNORMAL HIGH (ref 150–400)
RBC: 3.97 MIL/uL (ref 3.87–5.11)
RDW: 15 % (ref 11.5–15.5)
WBC: 12.4 10*3/uL — ABNORMAL HIGH (ref 4.0–10.5)
nRBC: 0 % (ref 0.0–0.2)

## 2023-02-20 LAB — PHOSPHORUS: Phosphorus: 4.4 mg/dL (ref 2.5–4.6)

## 2023-02-20 LAB — MAGNESIUM: Magnesium: 1.8 mg/dL (ref 1.7–2.4)

## 2023-02-20 LAB — PROCALCITONIN: Procalcitonin: <0.1 ng/mL

## 2023-02-20 LAB — C-REACTIVE PROTEIN: CRP: 5.3 mg/dL — ABNORMAL HIGH (ref <1.0)

## 2023-02-20 MED ORDER — CARVEDILOL 3.125 MG PO TABS
3.1250 mg | ORAL_TABLET | Freq: Two times a day (BID) | ORAL | 0 refills | Status: DC
  Filled 2023-02-20: qty 60, 30d supply, fill #0

## 2023-02-20 MED ORDER — LIDOCAINE 5 % EX PTCH
1.0000 | MEDICATED_PATCH | CUTANEOUS | 0 refills | Status: DC
  Filled 2023-02-20: qty 3, 3d supply, fill #0

## 2023-02-20 MED ORDER — TORSEMIDE 20 MG PO TABS
20.0000 mg | ORAL_TABLET | Freq: Two times a day (BID) | ORAL | 2 refills | Status: DC
  Filled 2023-02-20 – 2023-02-27 (×2): qty 60, 30d supply, fill #0

## 2023-02-20 NOTE — Progress Notes (Signed)
 [facility] Progress Note   Subjective:    Seen and examined patient at bedside. Tolerated yesterday's HD with net UF 2.5L. No acute issues. She reports mild congestion but otherwise feels okay. Patient is discharging home.  Objective Vitals:   02/19/23 2054 02/19/23 2320 02/20/23 0809 02/20/23 0834  BP: 118/82 103/74 125/89   Pulse: 85 87 90 91  Resp:    17  Temp:  98.2 F (36.8 C) 98.8 F (37.1 C)   TempSrc:  Oral Oral   SpO2:    93%  Weight:      Height:       Physical Exam General: Chronically ill female in NAD Heart: S1,S2 RRR  Lungs: CTAB A/P slightly decreased in bases No WOB Abdomen: obese NABS Extremities: no LE edema Dialysis Access: R TDC drsg intact  Filed Weights   02/17/23 0605 02/19/23 1439 02/19/23 1905  Weight: 82.6 kg 85.9 kg 83.4 kg    Intake/Output Summary (Last 24 hours) at 02/20/2023 1321 Last data filed at 02/20/2023 0900 Gross per 24 hour  Intake 120 ml  Output 5 ml  Net 115 ml    Additional Objective Labs: Basic Metabolic Panel: Recent Labs  Lab 02/18/23 0526 02/19/23 0444 02/20/23 0456  NA 127* 127* 134*  K 3.3* 3.7 3.7  CL 89* 88* 93*  CO2 25 25 27   GLUCOSE 95 90 96  BUN 32* 44* 24*  CREATININE 3.59* 4.16* 3.03*  CALCIUM  9.2 9.5 9.6  PHOS 4.7* 4.8* 4.4   Liver Function Tests: Recent Labs  Lab 02/18/23 0526 02/19/23 0444 02/20/23 0456  AST 18 17 22   ALT 13 12 15   ALKPHOS 112 121 118  BILITOT 0.4 0.6 0.6  PROT 6.9 7.1 7.3  ALBUMIN  2.7* 2.7* 2.8*   No results for input(s): LIPASE, AMYLASE in the last 168 hours. CBC: Recent Labs  Lab 02/16/23 0531 02/17/23 0614 02/18/23 0526 02/19/23 0444 02/20/23 0456  WBC 9.7 10.4 8.8 8.7 12.4*  NEUTROABS 6.9 7.8* 5.8 5.9 9.2*  HGB 10.9* 11.0* 10.6* 11.1* 11.5*  HCT 33.4* 34.5* 33.5* 33.8* 36.3  MCV 90.5 90.1 91.3 90.1 91.4  PLT 374 362 380 406* 425*   Blood Culture    Component Value Date/Time   SDES BLOOD RIGHT ARM 02/14/2023 1911   SPECREQUEST   02/14/2023 1911    BOTTLES DRAWN AEROBIC AND ANAEROBIC Blood Culture results may not be optimal due to an inadequate volume of blood received in culture bottles   CULT  02/14/2023 1911    NO GROWTH 5 DAYS Performed at [facility], [address], [place], [place] [address]    REPTSTATUS 02/19/2023 FINAL 02/14/2023 1911    Cardiac Enzymes: No results for input(s): CKTOTAL, CKMB, CKMBINDEX, TROPONINI in the last 168 hours. CBG: No results for input(s): GLUCAP in the last 168 hours. Iron  Studies: No results for input(s): IRON , TIBC, TRANSFERRIN, FERRITIN in the last 72 hours. Lab Results  Component Value Date   INR 1.2 02/15/2023   INR 1.2 03/02/2022   INR 1.0 04/25/2021   Studies/Results: No results found.  Medications:   atorvastatin   20 mg Oral Daily   calcitRIOL   0.25 mcg Oral Q M,W,F   carvedilol   3.125 mg Oral BID   Chlorhexidine  Gluconate Cloth  6 each Topical Q0600   dextromethorphan -guaiFENesin   1 tablet Oral BID   doxycycline   100 mg Oral Q12H   DULoxetine   30 mg Oral Daily   escitalopram   20 mg Oral Daily  ezetimibe   10 mg Oral Daily   fluticasone  furoate-vilanterol  1 puff Inhalation Daily   And   umeclidinium bromide   1 puff Inhalation Daily   lidocaine   1 patch Transdermal Q24H   lidocaine   1 patch Transdermal Q24H   mirtazapine   15 mg Oral QHS   pantoprazole   40 mg Oral Daily   QUEtiapine   100 mg Oral Daily   QUEtiapine   200 mg Oral QHS   sodium chloride  flush  3 mL Intravenous Q12H   torsemide   60 mg Oral BID    Dialysis Orders: Outpatient dialysis unit: [facility] Outpatient dialysis prescription: BFR 500, 3K, EDW 89kg, 4hr, 2ca, TDC, 30mcg mircera given 1/27, on IV iron , calcitriol  0.71mcg with each treatment  Assessment/Plan: # ESRD:  Last HD 2/1 with net UF 2.5L. Next HD this afternoon/evening to maintain MWF schedule.   # Volume/ hypertension: EDW 89kg. BP fairly low outpatient. Noted Amlodipine  was stopped and Coreg  lowered.   Chronic volume overload and persistent today.  Ultrafiltration as tolerated. Torsemide  raised to 60mg  BID. CXR yesterday showed patchy airspace consolidation in mid to lower left lobe.   # Anemia of Chronic Kidney Disease: Hemoglobin 10.1. No iv iron  for now. ESA when due   # Secondary Hyperparathyroidism/Hyperphosphatemia: Cont calcitriol . Very low Po4. Rx Kphos 30 mmol IV over 6 hours per primary. Follow labs.    # Vascular access: TDC with no issues   # Hypokalemia: Replace as needed.  High K bath   # Influenza infection: Management per primary team  #Dispo: Okay for discharge from a renal standpoint   # Additional recommendations: - Dose all meds for creatinine clearance < 10 ml/min  - Unless absolutely necessary, no MRIs with gadolinium.  - Implement save arm precautions.  Prefer needle sticks in the dorsum of the hands or wrists.  No blood pressure measurements in arm. - If blood transfusion is requested during hemodialysis sessions, please alert us  prior to the session.  - Use synthetic opioids (Fentanyl /Dilaudid ) if needed  Charmaine Piety, NP [facility] 02/20/2023,1:21 PM  LOS: 6 days

## 2023-02-20 NOTE — TOC Progression Note (Signed)
 Transition of Care [facility]) - Progression Note    Patient Details  Name: Tiffany [name] MRN: [id number] Date of Birth: 02/14/1960  [facility] CM/SW Contact  Hendricks KANDICE Her, RN Phone Number: 02/20/2023, 9:25 AM  Clinical Narrative:     RNCM called Daughter [name] and left a VM with call back information and a message stating RNCM would like to discuss DC          Expected Discharge Plan and Services                                               Social Determinants of Health (SDOH) Interventions SDOH Screenings   Food Insecurity: Food Insecurity Present (02/15/2023)  Housing: Low Risk  (02/15/2023)  Transportation Needs: No Transportation Needs (02/15/2023)  Utilities: Not At Risk (02/15/2023)  Alcohol Screen: Low Risk  (11/10/2022)  Depression (PHQ2-9): Low Risk  (07/11/2022)  Financial Resource Strain: Low Risk  (07/11/2022)  Physical Activity: Inactive (07/11/2022)  Social Connections: Socially Isolated (07/11/2022)  Stress: Stress Concern Present (07/11/2022)  Tobacco Use: Medium Risk (02/15/2023)    Readmission Risk Interventions    06/05/2022    2:47 PM 02/07/2022    2:22 PM 05/24/2021    6:19 PM  Readmission Risk Prevention Plan  Transportation Screening Complete Complete Complete  Medication Review (RN Care Manager) Referral to Pharmacy Complete Referral to Pharmacy  PCP or Specialist appointment within 3-5 days of discharge Complete Complete Complete  HRI or Home Care Consult Complete Complete Complete  SW Recovery Care/Counseling Consult Complete Complete Complete  Palliative Care Screening Not Applicable Not Applicable   Skilled Nursing Facility Not Applicable Not Applicable Complete

## 2023-02-20 NOTE — Plan of Care (Signed)

## 2023-02-20 NOTE — Discharge Summary (Signed)
 Tiffany [name] FMW:[id number] DOB: 06/07/1960 DOA: 02/13/2023  PCP: Ilah Crigler, MD  Admit date: 02/13/2023  Discharge date: 02/20/2023  Admitted From: Home   Disposition:  Home - refused SNF  x 3   Recommendations for Outpatient Follow-up:   Follow up with PCP in 1-2 weeks  PCP Please obtain BMP/CBC, 2 view CXR in 1week,  (see Discharge instructions)   PCP Please follow up on the following pending results:     Home Health: PT, OT, Aide   Equipment/Devices: None  Consultations: Renal Discharge Condition: Stable    CODE STATUS: Full    Diet Recommendation: Renal diet with 1.5 L fluid restriction per day   Chief Complaint  Patient presents with   Cough   Generalized Body Aches     Brief history of present illness from the day of admission and additional interim summary    63 y.o. female with medical history significant of end-stage renal disease on dialysis Monday Wednesday Friday.  Patient reports being typically wheelchair-bound due to paraparesis due to neuropathy.  Patient seems to have been in her usual state of health till around January 23, 6 days ago when she developed a new onset cough.  Patient was actually evaluated in the ER on January 25.  Patient was found to have fever 101 F at the time.  Patient was diagnosed with influenza A and discharged home at the time in reasonable condition.  Patient returned yesterday with complaints of chest pain with coughing, as well as abdominal pain during coughing spells, diagnosed with acute hypoxic respiratory failure due to influenza A pneumonia there was also concern for lung mass and she was admitted for further care.                                                                  Hospital Course   Acute hypoxic respiratory failure (HCC) due to influenza A  pneumonia.  Also question of left lung mass.  Could have postobstructive pneumonia due to that. She has been placed on Tamiflu  with good improvement, continue supplemental oxygen , symptom-free on room air, used I-S and flutter valve for pulmonary toiletry, CT chest stable.  Overall has generalized weakness and wheelchair-bound at baseline, qualified for SNF but vehemently refused.  Will be discharged home with home PT and PCP follow-up.  Schizophrenia (HCC) Continue Cymbalta  Lexapro  Remeron  and Seroquel .   Frequent falls - Patient has chronic paraparesis apparently due to neuropathy.  Patient wheelchair dependent at home.  Note patient is extremely weak and frail qualifies for SNF but refuses to go to SNF, adamant on being discharged home, have told her that her risk of readmission is very high, however she is vehemently refusing placement.   Chest pain due to cough, supportive care and monitor.  Stable EKG, pain  currently resolved with supportive care.  Single troponin borderline, of no significance clinically.  Subsequent troponins stable.  EKG remains unremarkable.  Lidoderm  patch provided, pain is due to sustained cough and intercostal muscle strain, much better with lidocaine  patch.   ESRD - on MWF schedule.  Nephrology on board.   Obesity: BMI of 34.8.  Follow-up with PCP for weight loss.   Mild left-sided flank/abdominal pain due to heparin  injection site hematoma.  Monitor, heparin  discontinued, placed on SCDs. Resolved clinically.     Discharge diagnosis     Principal Problem:   Acute hypoxic respiratory failure (HCC) Active Problems:   Acute renal failure superimposed on stage 4 chronic kidney disease (HCC)   Chest pain   Schizophrenia (HCC)   Influenza A   Community acquired pneumonia    Discharge instructions    Discharge Instructions     Discharge instructions   Complete by: As directed    Follow with Primary MD Ilah Crigler, MD in 7 days   Get CBC, CMP, 2 view  Chest X ray -  checked next visit with your primary MD    Activity: As tolerated with Full fall precautions use walker/cane & assistance as needed  Disposition Home   Diet: Renal diet with strict 1.5 L fluid restriction per day.  Special Instructions: If you have smoked or chewed Tobacco  in the last 2 yrs please stop smoking, stop any regular Alcohol  and or any Recreational drug use.  On your next visit with your primary care physician please Get Medicines reviewed and adjusted.  Please request your Prim.MD to go over all Hospital Tests and Procedure/Radiological results at the follow up, please get all Hospital records sent to your Prim MD by signing hospital release before you go home.  If you experience worsening of your admission symptoms, develop shortness of breath, life threatening emergency, suicidal or homicidal thoughts you must seek medical attention immediately by calling 911 or calling your MD immediately  if symptoms less severe.  You Must read complete instructions/literature along with all the possible adverse reactions/side effects for all the Medicines you take and that have been prescribed to you. Take any new Medicines after you have completely understood and accpet all the possible adverse reactions/side effects.   Do not drive when taking Pain medications.  Do not take more than prescribed Pain, Sleep and Anxiety Medications   Increase activity slowly   Complete by: As directed    No wound care   Complete by: As directed        Discharge Medications   Allergies as of 02/20/2023       Reactions   Asa [aspirin ] Other (See Comments)   GI bleeds   Hydrocodone  Shortness Of Breath, Hives   Latuda [lurasidone Hcl] Anaphylaxis   Lurasidone Anaphylaxis   Magnesium -containing Compounds Anaphylaxis, Other (See Comments)   Tolerated Ensure   Prednisone  Anaphylaxis, Swelling, Other (See Comments)   Swelling of tongue, lips, throat   Ultram  [tramadol ] Anaphylaxis,  Swelling, Rash   Codeine Nausea And Vomiting, Rash   Desyrel  [trazodone ] Other (See Comments)   Paranoia    Hydralazine  Other (See Comments)   Muscle spasms   Peanut (diagnostic) Hives, Other (See Comments)   Raw peanuts only  OK to eat peanut butter.   Peanut-containing Drug Products Hives   Raw peanuts, but can eat peanut butter   Topamax  [topiramate ] Other (See Comments)   Paranoia   Sulfa Antibiotics Itching   Tape Rash  Medication List     STOP taking these medications    amLODipine  10 MG tablet Commonly known as: NORVASC    furosemide  80 MG tablet Commonly known as: LASIX    lamoTRIgine  100 MG tablet Commonly known as: LAMICTAL    oxyCODONE  5 MG immediate release tablet Commonly known as: Roxicodone    prazosin  2 MG capsule Commonly known as: MINIPRESS        TAKE these medications    albuterol  108 (90 Base) MCG/ACT inhaler Commonly known as: VENTOLIN  HFA INHALE 2 PUFFS INTO THE LUNGS EVERY 6 HOURS AS NEEDED FOR WHEEZING OR SHORTNESS OF BREATH   alteplase  2 MG injection Commonly known as: CATHFLO ACTIVASE  2 mg by Intracatheter route once as needed for open catheter.   atorvastatin  20 MG tablet Commonly known as: LIPITOR  Take 1 tablet (20 mg total) by mouth daily.   calcitRIOL  0.5 MCG capsule Commonly known as: ROCALTROL  Take 1 capsule (0.5 mcg total) by mouth daily.   carvedilol  3.125 MG tablet Commonly known as: COREG  Take 1 tablet (3.125 mg total) by mouth 2 (two) times daily. What changed:  medication strength how much to take   cyclobenzaprine  10 MG tablet Commonly known as: FLEXERIL  Take 1 tablet (10 mg total) by mouth 3 (three) times daily.   cyclobenzaprine  10 MG tablet Commonly known as: FLEXERIL  take 1 tablet by mouth three times daily as needed   dicyclomine  10 MG capsule Commonly known as: BENTYL  Take 1 capsule (10 mg total) by mouth 3 (three) times daily before meals.   DULoxetine  30 MG capsule Commonly known as:  CYMBALTA  Take 1 capsule (30 mg total) by mouth daily.   epoetin  alfa 10000 UNIT/ML injection Commonly known as: EPOGEN  Inject 1 mL (10,000 Units total) into the vein every Monday, Wednesday, and Friday with hemodialysis.   escitalopram  20 MG tablet Commonly known as: LEXAPRO  Take 1 tablet (20 mg total) by mouth daily.   escitalopram  10 MG tablet Commonly known as: LEXAPRO  Take 10 mg by mouth daily.   ezetimibe  10 MG tablet Commonly known as: ZETIA  Take 1 tablet (10 mg total) by mouth daily.   FeroSul 325 (65 Fe) MG tablet Generic drug: ferrous sulfate  Take 1 tablet (325 mg total) by mouth every other day.   folic acid  1 MG tablet Commonly known as: FOLVITE  Take 1 tablet (1 mg total) by mouth daily.   HYDROcodone -acetaminophen  5-325 MG tablet Commonly known as: Norco Take 1 tablet by mouth every 6 (six) hours as needed for moderate pain (pain score 4-6).   ibuprofen  200 MG tablet Commonly known as: ADVIL  Take 400 mg by mouth 3 (three) times daily.   lidocaine  5 % Commonly known as: LIDODERM  Place 1 patch onto the skin daily. Remove & Discard patch within 12 hours or as directed by MD   loperamide  2 MG capsule Commonly known as: IMODIUM  Take 1 capsule (2 mg total) by mouth 2 (two) times daily.   melatonin 5 MG Tabs Take 1 tablet (5 mg total) by mouth at bedtime.   metolazone  2.5 MG tablet Commonly known as: ZAROXOLYN  Take 2.5 mg by mouth every Monday, Wednesday, and Friday.   mirtazapine  7.5 MG tablet Commonly known as: REMERON  Take 7.5 mg by mouth 2 (two) times daily. States takes 2 tablets po at bedtime. What changed: Another medication with the same name was removed. Continue taking this medication, and follow the directions you see here.   omeprazole  40 MG capsule Commonly known as: PRILOSEC Take 1 capsule (40 mg total) by  mouth every morning.   potassium chloride  SA 20 MEQ tablet Commonly known as: KLOR-CON  M Take 20 mEq by mouth See admin instructions.  Take 1 tablet by mouth for 3 days   promethazine  6.25 MG/5ML solution Commonly known as: PHENERGAN  Take 5 ml by mouth every 4-6 hours as needed for cough and nausea   QUEtiapine  100 MG tablet Commonly known as: SEROquel  Take 1 tablet (100 mg total) by mouth in the morning AND 2 tablets (200 mg total) every evening. What changed: Another medication with the same name was removed. Continue taking this medication, and follow the directions you see here.   torsemide  20 MG tablet Commonly known as: DEMADEX  Take 1 tablet (20 mg total) by mouth 2 (two) times daily.   torsemide  20 MG tablet Commonly known as: DEMADEX  Take 1 tablet (20 mg total) by mouth 2 (two) times daily.   Trelegy Ellipta  100-62.5-25 MCG/ACT Aepb Generic drug: Fluticasone -Umeclidin-Vilant INHALE 1 PUFF INTO THE LUNGS DAILY         Follow-up Information     [facility] Follow up.   Why: [facility] will call within 48  hpours to schedule your home health Contact information: [address] [place]  [address]  [phone]        Ilah Crigler, MD. Schedule an appointment as soon as possible for a visit in 1 week(s).   Specialty: Family Medicine Contact information: [address] [place] [address] [phone]                 Major procedures and Radiology Reports - PLEASE review detailed and final reports thoroughly  -     DG Chest [facility] 1 View Result Date: 02/18/2023 CLINICAL DATA:  Shortness of breath EXAM: PORTABLE CHEST 1 VIEW COMPARISON:  02/16/2023 FINDINGS: Right IJ approach hemodialysis catheter remains in place. Stable cardiomegaly. Patchy airspace consolidation in the mid to lower left lung without significant interval change. Minimal bibasilar atelectasis. No new airspace consolidations. No pleural effusion or pneumothorax. IMPRESSION: Patchy airspace consolidation in the mid to lower left lung without significant interval change. Electronically Signed   By: Mabel Converse  D.O.   On: 02/18/2023 08:38   DG CHEST PORT 1 VIEW Result Date: 02/16/2023 CLINICAL DATA:  Shortness of breath EXAM: PORTABLE CHEST 1 VIEW COMPARISON:  02/14/2023, CT 02/15/2023 FINDINGS: Right-sided central venous catheter tip at the right atrium. Low lung volumes. Improved aeration of the left mid to lower lung with residual ground-glass opacity. Stable cardiomediastinal silhouette. No pneumothorax IMPRESSION: Improved aeration of the left mid to lower lung but with residual ground-glass opacity/possible pneumonia. Continued imaging follow-up to resolution is recommended. Electronically Signed   By: Luke Bun M.D.   On: 02/16/2023 22:46   CT CHEST ABDOMEN PELVIS WO CONTRAST Addendum Date: 02/15/2023 ADDENDUM REPORT: 02/15/2023 16:54 ADDENDUM: Additional finding: There is increased density and thickening along the left side anterior abdominal wall, rectus abdominus muscle. This could represent a muscle hematoma. Please see axial series 3, image 58. Please correlate with symptoms and recommend follow-up to confirm resolution to exclude underlying pathology. Critical Value/emergent results were called by telephone at the time of interpretation on 02/15/2023 at 4:44 pm to provider [facility] , who verbally acknowledged these results. Electronically Signed   By: Ranell Bring M.D.   On: 02/15/2023 16:54   Result Date: 02/15/2023 CLINICAL DATA:  Lymphadenopathy.  Lung mass.  * Tracking Code: BO *. EXAM: CT CHEST, ABDOMEN AND PELVIS WITHOUT CONTRAST TECHNIQUE: Multidetector CT  imaging of the chest, abdomen and pelvis was performed following the standard protocol without IV contrast. RADIATION DOSE REDUCTION: This exam was performed according to the departmental dose-optimization program which includes automated exposure control, adjustment of the mA and/or kV according to patient size and/or use of iterative reconstruction technique. COMPARISON:  Renal stone CT and chest CT without contrast 11/07/2022.  chest x-ray 02/14/2023. FINDINGS: CT CHEST FINDINGS Cardiovascular: Large-bore right IJ catheter in place with tip seen at extending down to the SVC right atrial junction region. Trace pericardial fluid. Heart is nonenlarged. Few coronary artery calcifications are seen. Please correlate for other coronary risk factors. Grossly the thoracic aorta is normal course and caliber on this noncontrast exam. Mediastinum/Nodes: Normal caliber thoracic esophagus which is patulous. Preserved thyroid  gland. No specific abnormal lymph node enlargement identified in the axillary region, hilum or mediastinum. Lungs/Pleura: Large area of consolidation with air bronchograms along the left upper lobe compared components of ground-glass interstitial septal thickening. There is also some patchy areas seen along both lower lobes as well. Minimal area in the middle lobe. These are new from 2024 October. Acute infiltrative process is possible such as pneumonia. Please correlate clinical presentation. Would recommend follow up after treatment to confirm resolution and exclude underlying pathology. No associated pneumothorax or effusion. There is some breathing motion. Musculoskeletal: Scattered degenerative changes of the thoracic spine. Stable lucent areas in the vertebral body at T10. Old left-sided rib fracture deformities. CT ABDOMEN PELVIS FINDINGS Hepatobiliary: On this non IV contrast exam, grossly preserved hepatic parenchyma. Stones in the nondilated gallbladder. Pancreas: Unremarkable. No pancreatic ductal dilatation or surrounding inflammatory changes. Spleen: Normal in size without focal abnormality. Adrenals/Urinary Tract: 11 mm left-sided adrenal nodule with Hounsfield units of less than 0 consistent with an adenoma, unchanged from previous. Right adrenal gland is preserved. No abnormal calcification seen within either kidney nor along the expected course of either ureter. Asymmetric wall thickening along a slightly urinary  bladder, increased from previous. Please see series 3, image 99. A bladder lesion is not excluded. This could be aggressive. Please correlate with clinical presentation and follow-up evaluation. Stomach/Bowel: Air-fluid level along the nondilated stomach. Small bowel is nondilated. Normal caliber retrocecal appendix. This is along the appendix. Large bowel overall has a normal caliber with scattered stool and scattered diverticula particularly along the left side of the colon. Redundant loops of colon are seen diffusely including sigmoid and transverse colon. Vascular/Lymphatic: Aortic atherosclerosis. No enlarged abdominal or pelvic lymph nodes. Reproductive: Uterus is absent. Once again there is a cystic lesion in the left adnexa anteriorly which appears to abut the left ovary. Previous is measured 7.3 x 6.8 cm and today 7.3 by 6.8 cm. By noncontrast CT this appears relatively simple. This been present since at least October 2021. That time this measured up to 7.6 cm. Multiple long-term stability. Other: No free air or free fluid. Musculoskeletal: Degenerative changes seen of the spine and pelvis. Once again there is deformity at L3-4 with listhesis and fusion. Patient has a clinical history of previous discitis. There is associated stenosis of the central canal at this level IMPRESSION: Multifocal consolidative opacities identified in the lungs, greatest in left upper lobe. Mild areas in the lower lobes and minimal in the middle lobe. Multifocal infiltrate or pneumonia is possible. Please correlate clinical presentation recommend follow-up to confirm resolution and exclude underlying secondary pathology. No specific developing abnormal lymph node enlargement in the chest, abdomen and pelvis. Gallstones.  Colonic diverticula. Stable left adrenal adenoma. Stable  large cystic lesion in the left adnexa, likely ovarian but has been stable since at least 2021 demonstrating long-term stability. New asymmetric wall  thickening along left side of the urinary bladder. An aggressive lesion is in the differential recommend further workup when appropriate. Electronically Signed: By: Ranell Bring M.D. On: 02/15/2023 13:40   DG CHEST PORT 1 VIEW Result Date: 02/14/2023 CLINICAL DATA:  Shortness of breath.  Left-sided chest pain. EXAM: PORTABLE CHEST 1 VIEW COMPARISON:  11/07/2022. FINDINGS: There is new heterogeneous, at least 6 x 7.5 cm masslike consolidation overlying the left mid lower lung zones. Correlate clinically. Further evaluation with chest CT scan is recommended. Bilateral lungs are otherwise clear. No pulmonary edema. Bilateral costophrenic angles are clear. No pneumothorax. Stable cardio-mediastinal silhouette. No acute osseous abnormalities. The soft tissues are within normal limits. Right IJ hemodialysis catheter again seen with its tip overlying the cavoatrial junction region. IMPRESSION: There is a heterogeneous masslike consolidation overlying the left mid lower lung zones. Further evaluation with chest CT scan is recommended. Electronically Signed   By: Ree Molt M.D.   On: 02/14/2023 11:44   PERIPHERAL VASCULAR CATHETERIZATION Result Date: 02/01/2023   No indication for antiplatelet therapy at this time . Failed percutaneous thrombectomy attempt.  Needs to return for surgical revision or new access.   VAS US  DUPLEX DIALYSIS ACCESS (AVF,AVG) Result Date: 01/30/2023 DIALYSIS ACCESS Patient Name:  ICEIS KNAB [facility]  Date of Exam:   01/30/2023 Medical Rec #: [id number]        Accession #:    [id number] Date of Birth: 28-Jan-1960        Patient Gender: F Patient Age:   36 years Exam Location:  [facility] Procedure:      VAS US  DUPLEX DIALYSIS ACCESS (AVF, AVG) Referring Phys: LONNI GASKINS --------------------------------------------------------------------------------  Reason for Exam: No palpable thrill for AVF/AVG. Access Site: Left Upper Extremity. Access Type: Left upper arm AVG  12/21/2022. Performing Technologist: Geni Lodge RVS, RCS  Examination Guidelines: A complete evaluation includes B-mode imaging, spectral Doppler, color Doppler, and power Doppler as needed of all accessible portions of each vessel. Unilateral testing is considered an integral part of a complete examination. Limited examinations for reoccurring indications may be performed as noted.  Findings:   +--------------------+----------+-----------------+--------+ AVG                 PSV (cm/s)Flow Vol (mL/min)Describe +--------------------+----------+-----------------+--------+ Arterial anastomosis    51                              +--------------------+----------+-----------------+--------+ Prox graft              0                               +--------------------+----------+-----------------+--------+ Mid graft               0                               +--------------------+----------+-----------------+--------+ Distal graft            0                               +--------------------+----------+-----------------+--------+ Venous anastomosis      0                               +--------------------+----------+-----------------+--------+  Summary: Arteriovenous graft-Thrombus noted. No color or Doppler flow noted within the left upper arm AVG. *See table(s) above for measurements and observations.  Diagnosing physician: Lonni Gaskins MD Electronically signed by Lonni Gaskins MD on 01/30/2023 at 4:31:14 PM.    --------------------------------------------------------------------------------   Final     Micro Results    Recent Results (from the past 240 hours)  MRSA Next Gen by PCR, Nasal     Status: None   Collection Time: 02/14/23 11:46 AM   Specimen: Nasal Mucosa; Nasal Swab  Result Value Ref Range Status   MRSA by PCR Next Gen NOT DETECTED NOT DETECTED Final    Comment: (NOTE) The GeneXpert MRSA Assay (FDA approved for NASAL specimens only), is one  component of a comprehensive MRSA colonization surveillance program. It is not intended to diagnose MRSA infection nor to guide or monitor treatment for MRSA infections. Test performance is not FDA approved in patients less than 67 years old. Performed at [facility], [address] [place], [place] [address]   Culture, blood (Routine X 2) w Reflex to ID Panel     Status: None   Collection Time: 02/14/23  7:10 PM   Specimen: BLOOD RIGHT HAND  Result Value Ref Range Status   Specimen Description BLOOD RIGHT HAND  Final   Special Requests   Final    BOTTLES DRAWN AEROBIC AND ANAEROBIC Blood Culture results may not be optimal due to an inadequate volume of blood received in culture bottles   Culture   Final    NO GROWTH 5 DAYS Performed at [facility], [address], [place], [place] [address]    Report Status 02/19/2023 FINAL  Final  Culture, blood (Routine X 2) w Reflex to ID Panel     Status: None   Collection Time: 02/14/23  7:11 PM   Specimen: BLOOD RIGHT ARM  Result Value Ref Range Status   Specimen Description BLOOD RIGHT ARM  Final   Special Requests   Final    BOTTLES DRAWN AEROBIC AND ANAEROBIC Blood Culture results may not be optimal due to an inadequate volume of blood received in culture bottles   Culture   Final    NO GROWTH 5 DAYS Performed at [facility], [address], [place], [place] [address]    Report Status 02/19/2023 FINAL  Final    Today   Subjective    Tiffany [name] today has no headache,no chest abdominal pain,no new weakness tingling or numbness, feels much better wants to go home today.    Objective   Blood pressure 125/89, pulse 91, temperature 98.8 F (37.1 C), temperature source Oral, resp. rate 17, height 5' 3 (1.6 m), weight 83.4 kg, SpO2 93%.   Intake/Output Summary (Last 24 hours) at 02/20/2023 1103 Last data filed at 02/20/2023 0900 Gross per 24 hour  Intake 120 ml  Output 5 ml  Net 115 ml    Exam  Awake  Alert, No new F.N deficits,    [place].AT,PERRAL Supple Neck,   Symmetrical Chest wall movement, Good air movement bilaterally, CTAB RRR,No Gallops,   +ve B.Sounds, Abd Soft, Non tender,  No Cyanosis, Clubbing or edema    Data Review   Recent Labs  Lab 02/16/23 0531 02/17/23 0614 02/18/23 0526 02/19/23 0444 02/20/23 0456  WBC 9.7 10.4 8.8 8.7 12.4*  HGB 10.9* 11.0* 10.6* 11.1* 11.5*  HCT 33.4* 34.5* 33.5* 33.8* 36.3  PLT 374 362 380 406* 425*  MCV 90.5 90.1  91.3 90.1 91.4  MCH 29.5 28.7 28.9 29.6 29.0  MCHC 32.6 31.9 31.6 32.8 31.7  RDW 15.2 15.0 15.1 15.1 15.0  LYMPHSABS 1.3 0.9 1.4 1.3 1.5  MONOABS 1.2* 1.2* 1.0 0.8 1.0  EOSABS 0.1 0.2 0.2 0.2 0.2  BASOSABS 0.0 0.1 0.0 0.1 0.1    Recent Labs  Lab 02/14/23 0828 02/15/23 0517 02/16/23 0531 02/17/23 0200 02/17/23 0614 02/18/23 0526 02/19/23 0444 02/20/23 0456  NA 130* 131*  --  130* 132* 127* 127* 134*  K 3.0* 3.4*  --  2.9* 3.2* 3.3* 3.7 3.7  CL 91* 92*  --  86* 91* 89* 88* 93*  CO2 23 28  --  27 25 25 25 27   ANIONGAP 16* 11  --  17* 16* 13 14 14   GLUCOSE 95 99  --  95 85 95 90 96  BUN 39* 19  --  38* 14 32* 44* 24*  CREATININE 4.19* 3.03*  --  4.01* 2.26* 3.59* 4.16* 3.03*  AST 29  --   --   --  24 18 17 22   ALT 14  --   --   --  15 13 12 15   ALKPHOS 110  --   --   --  122 112 121 118  BILITOT 0.6  --   --   --  0.6 0.4 0.6 0.6  ALBUMIN  2.9*  --   --  2.5* 2.8* 2.7* 2.7* 2.8*  CRP  --   --   --   --  17.6* 11.9* 8.1* 5.3*  PROCALCITON  --   --   --   --  0.17 0.14 0.11 <0.10  INR  --  1.2  --   --   --   --   --   --   MG  --   --  1.5*  --  2.2 1.9 1.7 1.8  PHOS 3.5  --   --  3.7 2.0* 4.7* 4.8* 4.4  CALCIUM  8.6* 8.5*  --  9.5 9.0 9.2 9.5 9.6    Total Time in preparing paper work, data evaluation and todays exam - 35 minutes  Signature  -    Lavada Stank M.D on 02/20/2023 at 11:03 AM   -  To page go to [email or web address]

## 2023-02-20 NOTE — Progress Notes (Signed)
Patient's oxygen level checked while on RA and in bed, O2 saturation at 96% with no evidence of SHOB or dyspnea. Patient also without any complaints.

## 2023-02-20 NOTE — Discharge Instructions (Addendum)
 Follow with Primary MD Ilah Crigler, MD in 7 days   Get CBC, CMP, 2 view Chest X ray -  checked next visit with your primary MD    Activity: As tolerated with Full fall precautions use walker/cane & assistance as needed  Disposition Home   Diet: Renal diet with strict 1.5 L fluid restriction per day.  Special Instructions: If you have smoked or chewed Tobacco  in the last 2 yrs please stop smoking, stop any regular Alcohol  and or any Recreational drug use.  On your next visit with your primary care physician please Get Medicines reviewed and adjusted.  Please request your Prim.MD to go over all Hospital Tests and Procedure/Radiological results at the follow up, please get all Hospital records sent to your Prim MD by signing hospital release before you go home.  If you experience worsening of your admission symptoms, develop shortness of breath, life threatening emergency, suicidal or homicidal thoughts you must seek medical attention immediately by calling 911 or calling your MD immediately  if symptoms less severe.  You Must read complete instructions/literature along with all the possible adverse reactions/side effects for all the Medicines you take and that have been prescribed to you. Take any new Medicines after you have completely understood and accpet all the possible adverse reactions/side effects.   Do not drive when taking Pain medications.  Do not take more than prescribed Pain, Sleep and Anxiety Medications

## 2023-02-20 NOTE — Progress Notes (Signed)
D/C order noted. Contacted [facility] to be advised of pt's d/c today and that pt should resume care tomorrow. Clinic advised of pt's flu diagnosis and that pt declined snf.   Olivia Canter Renal Navigator [phone]

## 2023-02-20 NOTE — TOC Transition Note (Signed)
 Transition of Care [facility]) - Discharge Note   Patient Details  Name: Tiffany [name] MRN: [id number] Date of Birth: Feb 03, 1960  [facility] CM/SW Contact:  Hendricks KANDICE Her, RN Phone Number: 02/20/2023, 11:06 AM   Clinical Narrative:     Patient will DC to home today. [facility] will provide [facility] PT/OT and Aide. Patient is non ambulatory and will need PTAR transportation home. Patient states her Mother is nearby to assist if needed. Patient states she is independent and can manage all of her ADL's .  No additional needs         Patient Goals and CMS Choice            Discharge Placement                       Discharge Plan and Services Additional resources added to the After Visit Summary for                                       Social Drivers of Health (SDOH) Interventions SDOH Screenings   Food Insecurity: Food Insecurity Present (02/15/2023)  Housing: Low Risk  (02/15/2023)  Transportation Needs: No Transportation Needs (02/15/2023)  Utilities: Not At Risk (02/15/2023)  Alcohol Screen: Low Risk  (11/10/2022)  Depression (PHQ2-9): Low Risk  (07/11/2022)  Financial Resource Strain: Low Risk  (07/11/2022)  Physical Activity: Inactive (07/11/2022)  Social Connections: Socially Isolated (07/11/2022)  Stress: Stress Concern Present (07/11/2022)  Tobacco Use: Medium Risk (02/15/2023)     Readmission Risk Interventions    06/05/2022    2:47 PM 02/07/2022    2:22 PM 05/24/2021    6:19 PM  Readmission Risk Prevention Plan  Transportation Screening Complete Complete Complete  Medication Review (RN Care Manager) Referral to Pharmacy Complete Referral to Pharmacy  PCP or Specialist appointment within 3-5 days of discharge Complete Complete Complete  HRI or Home Care Consult Complete Complete Complete  SW Recovery Care/Counseling Consult Complete Complete Complete  Palliative Care Screening Not Applicable Not Applicable   Skilled Nursing Facility Not  Applicable Not Applicable Complete

## 2023-02-21 NOTE — Discharge Planning (Signed)
 [facility] Patient Discharge Orders- [facility] CLINIC: [facility]  Patient's name: Tiffany [name] Admit/DC Dates: 02/13/2023 - 02/20/2023  Discharge Diagnoses: Acute hypoxic respiratory failure 2nd influenza A. She received Tamiflu . CT chest stable   Frequent fall - wheelchair dependent at home. She refuses SNF placement and is going home Chest pain - EKG unremarkable and single trop borderline with no clinical significance Mild L-sided flank/ABD pain - 2nd heparin  injection site hematoma. Heparin  D/C'd. Monitor issue  Aranesp : Given: No    Last Hgb: 11.5 PRBC's Given: No  ESA dose for discharge: Hold Micera for now, see above for most recent Hgb here. Restart Micera protocol for Hgb less than 10. IV Iron  dose at discharge: Resume Fe load  Heparin  change: I didn't see any ordered. Hold for now (see above)  EDW Change: Yes New EDW: Lower to 84kg  Bath Change: No  Access intervention/Change: Not during this hospitalization but please see below.   Calcitriol  change: No  Discharge Labs: Calcium  9.6 Phosphorus 4.4 Albumin  2.8 K+ 3.7  IV Antibiotics: No Details: Received Tamiflu   On Coumadin?: No  Please note: Patient's TDC is in use. 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 Apparently, plan for her is to proceed with percutaneous intervention at [facility]. We need to follow-up whether this has been scheduled. If unsuccessful, we need to refer back to [facility] for new perm access.    D/C Meds to be reconciled by nurse after every discharge.  Completed By: Charmaine Piety, NP   Reviewed by: MD:______ RN_______

## 2023-02-22 ENCOUNTER — Other Ambulatory Visit: Payer: Self-pay

## 2023-02-22 ENCOUNTER — Encounter (HOSPITAL_COMMUNITY): Payer: Self-pay

## 2023-02-23 ENCOUNTER — Other Ambulatory Visit: Payer: Self-pay

## 2023-02-23 ENCOUNTER — Other Ambulatory Visit (HOSPITAL_COMMUNITY): Payer: Self-pay

## 2023-02-26 ENCOUNTER — Other Ambulatory Visit: Payer: Self-pay

## 2023-02-26 ENCOUNTER — Other Ambulatory Visit: Payer: Self-pay | Admitting: Family Medicine

## 2023-02-26 ENCOUNTER — Other Ambulatory Visit (HOSPITAL_BASED_OUTPATIENT_CLINIC_OR_DEPARTMENT_OTHER): Payer: Self-pay

## 2023-02-26 ENCOUNTER — Other Ambulatory Visit (HOSPITAL_COMMUNITY): Payer: Self-pay

## 2023-02-26 MED ORDER — LAMOTRIGINE 100 MG PO TABS
100.0000 mg | ORAL_TABLET | Freq: Every day | ORAL | 5 refills | Status: DC
  Filled 2023-02-26 – 2023-04-14 (×4): qty 30, 30d supply, fill #0
  Filled 2023-04-16: qty 21, 21d supply, fill #0
  Filled 2023-05-01 – 2023-05-07 (×3): qty 21, 21d supply, fill #1

## 2023-02-26 MED ORDER — AMLODIPINE BESYLATE 10 MG PO TABS
10.0000 mg | ORAL_TABLET | Freq: Every day | ORAL | 5 refills | Status: DC
  Filled 2023-02-26 (×3): qty 30, 30d supply, fill #0
  Filled 2023-05-02 – 2023-05-11 (×3): qty 30, 30d supply, fill #1

## 2023-02-26 MED ORDER — PRAZOSIN HCL 2 MG PO CAPS
2.0000 mg | ORAL_CAPSULE | Freq: Every day | ORAL | 5 refills | Status: AC
  Filled 2023-02-26 – 2023-04-05 (×3): qty 30, 30d supply, fill #0
  Filled 2023-05-02 (×2): qty 30, 30d supply, fill #1
  Filled 2023-07-13 (×2): qty 30, 30d supply, fill #2
  Filled 2023-09-20 (×2): qty 60, 60d supply, fill #2
  Filled 2023-11-26: qty 60, 60d supply, fill #3

## 2023-02-26 MED ORDER — CARVEDILOL 12.5 MG PO TABS
12.5000 mg | ORAL_TABLET | Freq: Two times a day (BID) | ORAL | 5 refills | Status: DC
  Filled 2023-02-26: qty 60, 30d supply, fill #0
  Filled 2023-04-04 – 2023-04-05 (×2): qty 60, 30d supply, fill #1
  Filled 2023-05-01 (×2): qty 60, 30d supply, fill #2

## 2023-02-27 ENCOUNTER — Other Ambulatory Visit (HOSPITAL_COMMUNITY): Payer: Self-pay

## 2023-02-27 ENCOUNTER — Other Ambulatory Visit: Payer: Self-pay

## 2023-02-27 MED ORDER — ALBUTEROL SULFATE HFA 108 (90 BASE) MCG/ACT IN AERS
2.0000 | INHALATION_SPRAY | RESPIRATORY_TRACT | 1 refills | Status: DC
  Filled 2023-02-27: qty 6.7, 30d supply, fill #0
  Filled 2023-02-27: qty 6.7, 20d supply, fill #0

## 2023-02-27 MED ORDER — AZITHROMYCIN 250 MG PO TABS
ORAL_TABLET | ORAL | 0 refills | Status: DC
Start: 2023-02-27 — End: 2023-06-01
  Filled 2023-02-27 (×2): qty 6, 5d supply, fill #0

## 2023-02-28 ENCOUNTER — Other Ambulatory Visit (HOSPITAL_COMMUNITY): Payer: Self-pay

## 2023-02-28 ENCOUNTER — Other Ambulatory Visit: Payer: Self-pay

## 2023-03-01 ENCOUNTER — Other Ambulatory Visit: Payer: Self-pay

## 2023-03-02 ENCOUNTER — Encounter (HOSPITAL_COMMUNITY): Payer: Self-pay

## 2023-03-02 ENCOUNTER — Other Ambulatory Visit: Payer: Self-pay

## 2023-03-05 ENCOUNTER — Other Ambulatory Visit (HOSPITAL_COMMUNITY): Payer: Self-pay

## 2023-03-05 NOTE — Progress Notes (Unsigned)
Patient name: Tiffany [name] MRN: [id number] DOB: [birth year] Sex: female  REASON FOR CONSULT: New HD access  HPI: Tiffany [name] is a 63 y.o. female, with multiple medical problems including end-stage renal disease that presents for new HD access.  She had a left arm AV graft that was placed on 12/21/2022 by Dr. Lenell Antu.  This underwent failed percutaneous thrombectomy on 02/01/2023 by nephrology.  Now using a right IJ TDC.  States she is on dialysis Monday Wednesday Friday.  [name] right-handed.  Does not want any further access at this time and would like to continue using her catheter.  Past Medical History:  Diagnosis Date   Abdominal pain    Acute encephalopathy 11/21/2017   Acute exacerbation of CHF (congestive heart failure) (HCC) 03/03/2022   Acute GI bleeding 03/29/2021   Acute kidney injury superimposed on chronic kidney disease (HCC) 02/18/2020   Acute metabolic encephalopathy 02/20/2020   Acute on chronic diastolic heart failure (HCC) 02/04/2022   Acute on chronic renal failure (HCC) 06/02/2022   Aggressive behavior    Agitation 11/22/2017   Anoxic brain injury (HCC) 09/08/2016   C. Arrest due to respiratory failure and COPD exacerbation   Anxiety    Arthritis    "all over" (04/10/2016)   Asthma 10/18/2010   Binge eating disorder    Blood loss anemia 04/24/2011   CAP (community acquired pneumonia) 06/22/2015   Cardiac arrest (HCC) 09/08/2016   PEA   Carotid artery stenosis    1-39% bilateral by dopplers 11/2016   Chronic diastolic (congestive) heart failure (HCC)    Chronic kidney disease, stage 3 (HCC)    Chronic kidney disease, stage 3b (HCC) 06/06/2021   Chronic pain syndrome 06/18/2012   Chronic post-traumatic stress disorder (PTSD) 05/27/2018   Chronic respiratory failure with hypoxia and hypercapnia (HCC) 06/22/2015   TRILOGY Vent >AVAPA-ES., Vt target 200-400, Max P 30 , PS max 20 , PS min 6-10 , E Max 6, E Min 4, Rate Auto AVAPS Rate 2 (titrate for pt  comfort) , bleed O2 at 5l/m continuous flow .    CKD (chronic kidney disease) 02/03/2022   Closed displaced fracture of fifth metacarpal bone 03/21/2018   Cocaine use disorder, severe, in sustained remission (HCC) 12/17/2015   Complication of anesthesia    decreased bp, decreased heart rate   COPD (chronic obstructive pulmonary disease) (HCC) 07/08/2014   Delusional disorder, persecutory type (HCC) 06/26/2021   Depression    Diabetic neuropathy (HCC) 04/24/2011   Difficulty with speech 01/24/2018   Disorder of nervous system    Drug abuse (HCC) 11/21/2017   Dyslipidemia 04/24/2011   E. coli UTI 02/20/2020   Elevated troponin 04/28/2012   Emphysema    Encephalopathy 11/21/2017   Essential hypertension 03/22/2016   Facet arthropathy, lumbar 06/12/2022   Fibula fracture 07/10/2016   Frequent falls 10/11/2017   GERD (gastroesophageal reflux disease)    GI bleed 03/30/2021   Gout 04/11/2017   Heart attack (HCC) 1980s   Hematochezia    History of blood transfusion 1994   "couldn't stop bleeding from my period"   History of drug abuse in remission (HCC) 11/28/2015   Quit in 2017   Hyperlipidemia 03/31/2021   Hyperlipidemia LDL goal <70    Hypocalcemia    Hypokalemia    Hypomagnesemia    Incontinence    Manic depression (HCC)    Morbid obesity (HCC) 10/18/2010   Obstructive sleep apnea 10/18/2010   On home oxygen therapy    "  6L; 24/7" (04/10/2016)   OSA on CPAP    "wear mask sometimes" (04/10/2016)   Painless rectal bleeding 12/31/2015   Paranoid (HCC)    "sometimes; I'm on RX for it" (04/10/2016)   Periumbilical abdominal pain    Prolonged Q-T interval on ECG    Rectal bleeding 12/31/2015   Recurrent syncope 07/09/2021   Rhabdomyolysis 06/16/2021   Schizoaffective disorder, bipolar type (HCC) 04/05/2018   Seasonal allergies    Seborrheic keratoses 12/31/2013   Seizures (HCC)    "don't know what kind; last one was ~ 1 yr ago" (04/10/2016)   Sinus bradycardia    Skin  ulcer of sacrum, limited to breakdown of skin (HCC) 03/08/2022   Spondylolisthesis at L3-L4 level 06/12/2022   Stroke (HCC) 1980s   denies residual on 04/10/2016   Thrush 09/19/2013   Type 2 diabetes mellitus (HCC) 10/18/2010    Past Surgical History:  Procedure Laterality Date   A/V FISTULAGRAM N/A 02/01/2023   Procedure: A/V Fistulagram;  Surgeon: Tyler Pita, MD;  Location: [facility]ASIVE [facility];  Service: Cardiovascular;  Laterality: N/A;   AV FISTULA PLACEMENT Left 12/21/2022   Procedure: INSERTION OF LEFT ARM ARTERIOVENOUS (AV) GORE-TEX GRAFT;  Surgeon: Leonie Douglas, MD;  Location: [facility] OR;  Service: Vascular;  Laterality: Left;   CESAREAN SECTION  1997   COLONOSCOPY WITH PROPOFOL N/A 04/01/2021   Procedure: COLONOSCOPY WITH PROPOFOL;  Surgeon: Jeani Hawking, MD;  Location[facility] ENDOSCOPY;  Service: Gastroenterology;  Laterality: N/A;  Rectal bleeding with drop in hemoglobin to 7.2 g/dL   HERNIA REPAIR     IR CHOLANGIOGRAM EXISTING TUBE  07/20/2016   IR PERC CHOLECYSTOSTOMY  05/10/2016   IR RADIOLOGIST EVAL & MGMT  06/08/2016   IR RADIOLOGIST EVAL & MGMT  06/29/2016   IR SINUS/FIST TUBE CHK-NON GI  07/12/2016   RIGHT/LEFT HEART CATH AND CORONARY ANGIOGRAPHY N/A 06/19/2017   Procedure: RIGHT/LEFT HEART CATH AND CORONARY ANGIOGRAPHY;  Surgeon: Dolores Patty, MD;  Location: [facility];  Service: Cardiovascular;  Laterality: N/A;   TIBIA IM NAIL INSERTION Right 07/12/2016   Procedure: INTRAMEDULLARY (IM) NAIL RIGHT TIBIA;  Surgeon: Tarry Kos, MD;  Location: [facility] OR;  Service: Orthopedics;  Laterality: Right;   UMBILICAL HERNIA REPAIR  ~ 1963   "that's why I don't have a belly button"   VAGINAL HYSTERECTOMY      Family History  Problem Relation Age of Onset   Cancer Mother        lung   Depression Mother    Cancer Father        prostate   Depression Sister    Anxiety disorder Sister    Schizophrenia Sister    Bipolar disorder Sister    Depression Sister     Depression Brother    Heart failure Other        cousin    SOCIAL HISTORY: Social History   Socioeconomic History   Marital status: Widowed    Spouse name: Not on file   Number of children: 3   Years of education: 12th   Highest education level: 12th grade  Occupational History   Occupation: disabled    Comment: factory production  Tobacco Use   Smoking status: Former    Current packs/day: 0.00    Average packs/day: 1.5 packs/day for 39.1 years (58.6 ttl pk-yrs)    Types: Cigarettes    Start date: 03/13/1977    Quit date: 04/10/2016    Years since quitting: 6.9  Passive exposure: Past   Smokeless tobacco: Never  Vaping Use   Vaping status: Never Used  Substance and Sexual Activity   Alcohol use: No    Alcohol/week: 0.0 standard drinks of alcohol   Drug use: Not Currently    Types: Cocaine    Comment: 04/10/2016 "last used cocaine back in November 2017"   Sexual activity: Not Currently    Birth control/protection: Surgical  Other Topics Concern   Not on file  Social History Narrative   Has 1 son, [name]   Lives with son and his boyfriend   Her house has ramps and handrails should she ever needs them.    Her mother lives down the street from her and is a good support person in addition to her son.   She drives herself, has private transportation.    Cocaine free since 02/24/16, smoke free since 04/10/16   Dialysis M-W-F   Social Drivers of Health   Financial Resource Strain: Low Risk  (07/11/2022)   Overall Financial Resource Strain (CARDIA)    Difficulty of Paying Living Expenses: Not very hard  Food Insecurity: Food Insecurity Present (02/15/2023)   Hunger Vital Sign    Worried About Running Out of Food in the Last Year: Sometimes true    Ran Out of Food in the Last Year: Sometimes true  Transportation Needs: No Transportation Needs (02/15/2023)   PRAPARE - Administrator, Civil Service (Medical): No    Lack of Transportation (Non-Medical): No  Physical  Activity: Inactive (07/11/2022)   Exercise Vital Sign    Days of Exercise per Week: 0 days    Minutes of Exercise per Session: 0 min  Stress: Stress Concern Present (07/11/2022)   [employer] of Occupational Health - Occupational Stress Questionnaire    Feeling of Stress : To some extent  Social Connections: Socially Isolated (07/11/2022)   Social Connection and Isolation Panel [NHANES]    Frequency of Communication with Friends and Family: Three times a week    Frequency of Social Gatherings with Friends and Family: Three times a week    Attends Religious Services: Never    Active Member of Clubs or Organizations: No    Attends Banker Meetings: Never    Marital Status: Widowed  Intimate Partner Violence: Not At Risk (02/15/2023)   Humiliation, Afraid, Rape, and Kick questionnaire    Fear of Current or Ex-Partner: No    Emotionally Abused: No    Physically Abused: No    Sexually Abused: No    Allergies  Allergen Reactions   Asa [Aspirin] Other (See Comments)    GI bleeds   Hydrocodone Shortness Of Breath and Hives   Latuda [Lurasidone Hcl] Anaphylaxis   Lurasidone Anaphylaxis   Magnesium-Containing Compounds Anaphylaxis and Other (See Comments)    Tolerated Ensure   Prednisone Anaphylaxis, Swelling and Other (See Comments)    Swelling of tongue, lips, throat   Ultram [Tramadol] Anaphylaxis, Swelling and Rash   Codeine Nausea And Vomiting and Rash   Desyrel [Trazodone] Other (See Comments)    Paranoia    Hydralazine Other (See Comments)    Muscle spasms   Peanut (Diagnostic) Hives and Other (See Comments)    Raw peanuts only  OK to eat peanut butter.   Peanut-Containing Drug Products Hives    Raw peanuts, but can eat peanut butter   Topamax [Topiramate] Other (See Comments)    Paranoia   Sulfa Antibiotics Itching   Tape Rash  Current Outpatient Medications  Medication Sig Dispense Refill   albuterol (VENTOLIN HFA) 108 (90 Base) MCG/ACT inhaler  INHALE 2 PUFFS INTO THE LUNGS EVERY 6 HOURS AS NEEDED FOR WHEEZING OR SHORTNESS OF BREATH 8.5 g 2   albuterol (VENTOLIN HFA) 108 (90 Base) MCG/ACT inhaler Inhale 2 puffs into the lungs every 4 (four)- 6 (six) hours. 6.7 g 1   alteplase (CATHFLO ACTIVASE) 2 MG injection 2 mg by Intracatheter route once as needed for open catheter. 1 each 3   amLODipine (NORVASC) 10 MG tablet Take 1 tablet (10 mg total) by mouth daily. 30 tablet 5   atorvastatin (LIPITOR) 20 MG tablet Take 1 tablet (20 mg total) by mouth daily. 30 tablet 5   azithromycin (ZITHROMAX) 250 MG tablet Take 2  tablets by mouth  on day one then 1 tablet once a day for four days. 6 tablet 0   calcitRIOL (ROCALTROL) 0.5 MCG capsule Take 1 capsule (0.5 mcg total) by mouth daily. 30 capsule 5   carvedilol (COREG) 12.5 MG tablet Take 1 tablet (12.5 mg total) by mouth 2 (two) times daily. 60 tablet 5   carvedilol (COREG) 3.125 MG tablet Take 1 tablet (3.125 mg total) by mouth 2 (two) times daily. 60 tablet 0   cyclobenzaprine (FLEXERIL) 10 MG tablet Take 1 tablet (10 mg total) by mouth 3 (three) times daily. 90 tablet 5   cyclobenzaprine (FLEXERIL) 10 MG tablet take 1 tablet by mouth three times daily as needed 90 tablet 2   dicyclomine (BENTYL) 10 MG capsule Take 1 capsule (10 mg total) by mouth 3 (three) times daily before meals. 90 capsule 5   DULoxetine (CYMBALTA) 30 MG capsule Take 1 capsule (30 mg total) by mouth daily. 30 capsule 5   epoetin alfa (EPOGEN) 10000 UNIT/ML injection Inject 1 mL (10,000 Units total) into the vein every Monday, Wednesday, and Friday with hemodialysis. 1 mL 3   escitalopram (LEXAPRO) 10 MG tablet Take 10 mg by mouth daily.     escitalopram (LEXAPRO) 20 MG tablet Take 1 tablet (20 mg total) by mouth daily. 30 tablet 5   ezetimibe (ZETIA) 10 MG tablet Take 1 tablet (10 mg total) by mouth daily. 30 tablet 5   ferrous sulfate 325 (65 FE) MG tablet Take 1 tablet (325 mg total) by mouth every other day. 30 tablet 5    Fluticasone-Umeclidin-Vilant (TRELEGY ELLIPTA) 100-62.5-25 MCG/ACT AEPB INHALE 1 PUFF INTO THE LUNGS DAILY 60 each 0   folic acid (FOLVITE) 1 MG tablet Take 1 tablet (1 mg total) by mouth daily. 30 tablet 5   HYDROcodone-acetaminophen (NORCO) 5-325 MG tablet Take 1 tablet by mouth every 6 (six) hours as needed for moderate pain (pain score 4-6). 10 tablet 0   ibuprofen (ADVIL) 200 MG tablet Take 400 mg by mouth 3 (three) times daily.     lamoTRIgine (LAMICTAL) 100 MG tablet Take 1 tablet (100 mg total) by mouth daily. 30 tablet 5   lidocaine (LIDODERM) 5 % Place 1 patch onto the skin daily. Remove & Discard patch within 12 hours or as directed by MD 3 patch 0   loperamide (IMODIUM) 2 MG capsule Take 1 capsule (2 mg total) by mouth 2 (two) times daily. 30 capsule 5   melatonin 5 MG TABS Take 1 tablet (5 mg total) by mouth at bedtime. 30 tablet 0   metolazone (ZAROXOLYN) 2.5 MG tablet Take 2.5 mg by mouth every Monday, Wednesday, and Friday.     mirtazapine (REMERON) 7.5  MG tablet Take 7.5 mg by mouth 2 (two) times daily. States takes 2 tablets po at bedtime.     omeprazole (PRILOSEC) 40 MG capsule Take 1 capsule (40 mg total) by mouth every morning. 30 capsule 5   potassium chloride SA (KLOR-CON M) 20 MEQ tablet Take 20 mEq by mouth See admin instructions. Take 1 tablet by mouth for 3 days     prazosin (MINIPRESS) 2 MG capsule Take 1 capsule (2 mg total) by mouth at bedtime. 30 capsule 5   promethazine (PHENERGAN) 6.25 MG/5ML solution Take 5 ml by mouth every 4-6 hours as needed for cough and nausea 300 mL 1   QUEtiapine (SEROQUEL) 100 MG tablet Take 1 tablet (100 mg total) by mouth in the morning AND 2 tablets (200 mg total) every evening. 90 tablet 2   torsemide (DEMADEX) 20 MG tablet Take 1 tablet (20 mg total) by mouth 2 (two) times daily. (Patient taking differently: Take 20 mg by mouth 2 (two) times daily. Pt states she is taking 3 times a day) 60 tablet 2   torsemide (DEMADEX) 20 MG tablet Take  1 tablet (20 mg total) by mouth 2 (two) times daily. 60 tablet 2   No current facility-administered medications for this visit.    REVIEW OF SYSTEMS:  [X]  denotes positive finding, [ ]  denotes negative finding Cardiac  Comments:  Chest pain or chest pressure:    Shortness of breath upon exertion:    Short of breath when lying flat:    Irregular heart rhythm:        Vascular    Pain in calf, thigh, or hip brought on by ambulation:    Pain in feet at night that wakes you up from your sleep:     Blood clot in your veins:    Leg swelling:         Pulmonary    Oxygen at home:    Productive cough:     Wheezing:         Neurologic    Sudden weakness in arms or legs:     Sudden numbness in arms or legs:     Sudden onset of difficulty speaking or slurred speech:    Temporary loss of vision in one eye:     Problems with dizziness:         Gastrointestinal    Blood in stool:     Vomited blood:         Genitourinary    Burning when urinating:     Blood in urine:        Psychiatric    Major depression:         Hematologic    Bleeding problems:    Problems with blood clotting too easily:        Skin    Rashes or ulcers:        Constitutional    Fever or chills:      PHYSICAL EXAM: There were no vitals filed for this visit.   GENERAL: The patient is a well-nourished female, in no acute distress. The vital signs are documented above. CARDIAC: There is a regular rate and rhythm.  VASCULAR:  Left radial and brachial pulse palpable Right IJ TDC PULMONARY: No respiratory distress. ABDOMEN: Soft and non-tender. MUSCULOSKELETAL: There are no major deformities or cyanosis. NEUROLOGIC: No focal weakness or paresthesias are detected.  DATA:   UPPER EXTREMITY VEIN MAPPING  Patient Name:  LUCA BURSTON [facility]  Date of Exam:  08/29/2022 Medical Rec #: [id number]        Accession #:    [id number] Date of Birth: 08/13/1960        Patient Gender: F Patient Age:   70 years Exam  Location:  [facility] Procedure:      VAS [name] UPPER EXT VEIN MAPPING (PRE-OP AVF) Referring Phys: Sherald Hess   --------------------------------------------------------------------------- -----   Indications: Pre-access.  Performing Technologist: [place] Ponder RVS    Examination Guidelines: A complete evaluation includes B-mode imaging, spectral Doppler, color Doppler, and power Doppler as needed of all accessible portions of each vessel. Bilateral testing is considered an integral part of a complete examination. Limited examinations for reoccurring indications may be performed as noted.  +-----------------+-------------+----------+--------------+ Right Cephalic   Diameter (cm)Depth (cm)   Findings    +-----------------+-------------+----------+--------------+ Shoulder                                not visualized +-----------------+-------------+----------+--------------+ Prox upper arm                          not visualized +-----------------+-------------+----------+--------------+ Mid upper arm                           not visualized +-----------------+-------------+----------+--------------+ Dist upper arm                          not visualized +-----------------+-------------+----------+--------------+ Antecubital fossa    0.22        0.27                  +-----------------+-------------+----------+--------------+ Prox forearm         0.10        0.63                  +-----------------+-------------+----------+--------------+ Mid forearm          0.07        0.39                  +-----------------+-------------+----------+--------------+ Dist forearm         0.10        0.32                  +-----------------+-------------+----------+--------------+ Wrist                                   not  visualized +-----------------+-------------+----------+--------------+  +-----------------+-------------+----------+--------------+ Right Basilic    Diameter (cm)Depth (cm)   Findings    +-----------------+-------------+----------+--------------+ Prox upper arm       0.25        1.27                  +-----------------+-------------+----------+--------------+ Mid upper arm        0.19        1.20                  +-----------------+-------------+----------+--------------+ Dist upper arm       0.22        0.94                  +-----------------+-------------+----------+--------------+ Antecubital fossa    0.30        0.94                  +-----------------+-------------+----------+--------------+  Prox forearm                            not visualized +-----------------+-------------+----------+--------------+ Mid forearm                             not visualized +-----------------+-------------+----------+--------------+ Distal forearm                          not visualized +-----------------+-------------+----------+--------------+ Elbow                                   not visualized +-----------------+-------------+----------+--------------+ Wrist                                   not visualized +-----------------+-------------+----------+--------------+  +-----------------+-------------+----------+--------------+ Left Cephalic    Diameter (cm)Depth (cm)   Findings    +-----------------+-------------+----------+--------------+ Shoulder             0.10        0.93                  +-----------------+-------------+----------+--------------+ Prox upper arm       0.10        0.96                  +-----------------+-------------+----------+--------------+ Mid upper arm        0.16        1.01                  +-----------------+-------------+----------+--------------+ Dist upper arm       0.19        0.47                   +-----------------+-------------+----------+--------------+ Antecubital fossa    0.10        0.69                  +-----------------+-------------+----------+--------------+ Prox forearm         0.10        0.46                  +-----------------+-------------+----------+--------------+ Mid forearm          0.13        0.47                  +-----------------+-------------+----------+--------------+ Dist forearm                            not visualized +-----------------+-------------+----------+--------------+ Wrist                                   not visualized +-----------------+-------------+----------+--------------+  +-----------------+-------------+----------+--------------+ Left Basilic     Diameter (cm)Depth (cm)   Findings    +-----------------+-------------+----------+--------------+ Prox upper arm       0.26        1.16                  +-----------------+-------------+----------+--------------+ Mid upper arm        0.21        1.23                  +-----------------+-------------+----------+--------------+  Dist upper arm       0.24        1.26                  +-----------------+-------------+----------+--------------+ Antecubital fossa                       not visualized +-----------------+-------------+----------+--------------+ Prox forearm                            not visualized +-----------------+-------------+----------+--------------+ Mid forearm                             not visualized +-----------------+-------------+----------+--------------+ Distal forearm                          not visualized +-----------------+-------------+----------+--------------+ Elbow                                   not visualized +-----------------+-------------+----------+--------------+ Wrist                                   not  visualized +-----------------+-------------+----------+--------------+  *See table(s) above for measurements and observations.     Diagnosing physician: Sherald Hess MD Electronically signed by Sherald Hess MD on 08/29/2022 at 1:54:55 PM.      Assessment/Plan:  63 y.o. female, with multiple medical problems including end-stage renal disease that presents for new HD access.   She had a left arm AV graft that was placed on 12/21/2022 by Dr. Lenell Antu.  This underwent failed percutaneous thrombectomy on 02/01/2023 by nephrology.  I discussed the option of going to her right arm versus another attempt at access in her left arm.  Ultimately she states she would prefer to just continue using her catheter.  States she is concerned about additional anesthesia and is frustrated with the outcome after her first access.  I certainly understand her frustration.  I discussed we could go back to her left arm if and when she decides to proceed.  I discussed long-term catheter dependence can put her at increased risk for infection which she understands.  I encouraged her to talk to her nephrologist.  She can call back to schedule left arm AV fistula versus graft if she changes her mind.   Cephus Shelling, MD [facility] Office: [phone]

## 2023-03-06 ENCOUNTER — Other Ambulatory Visit: Payer: Self-pay

## 2023-03-06 ENCOUNTER — Ambulatory Visit (INDEPENDENT_AMBULATORY_CARE_PROVIDER_SITE_OTHER): Payer: PPO | Admitting: Vascular Surgery

## 2023-03-06 ENCOUNTER — Encounter: Payer: Self-pay | Admitting: Vascular Surgery

## 2023-03-06 VITALS — BP 111/75 | HR 77 | Temp 98.7°F | Resp 22 | Ht 63.0 in | Wt 183.0 lb

## 2023-03-06 DIAGNOSIS — N186 End stage renal disease: Secondary | ICD-10-CM

## 2023-03-06 DIAGNOSIS — Z992 Dependence on renal dialysis: Secondary | ICD-10-CM

## 2023-03-08 ENCOUNTER — Other Ambulatory Visit (HOSPITAL_COMMUNITY): Payer: Self-pay

## 2023-03-09 ENCOUNTER — Ambulatory Visit (HOSPITAL_COMMUNITY)
Admission: EM | Admit: 2023-03-09 | Discharge: 2023-03-10 | Disposition: A | Discharge status: Other Acute Inpatient Hospital | Payer: PPO | Attending: Nurse Practitioner | Admitting: Nurse Practitioner

## 2023-03-09 ENCOUNTER — Other Ambulatory Visit: Payer: Self-pay

## 2023-03-09 ENCOUNTER — Other Ambulatory Visit (HOSPITAL_COMMUNITY): Payer: Self-pay

## 2023-03-09 DIAGNOSIS — I509 Heart failure, unspecified: Secondary | ICD-10-CM | POA: Insufficient documentation

## 2023-03-09 DIAGNOSIS — J449 Chronic obstructive pulmonary disease, unspecified: Secondary | ICD-10-CM | POA: Diagnosis not present

## 2023-03-09 DIAGNOSIS — F322 Major depressive disorder, single episode, severe without psychotic features: Secondary | ICD-10-CM | POA: Diagnosis not present

## 2023-03-09 DIAGNOSIS — R45851 Suicidal ideations: Secondary | ICD-10-CM | POA: Insufficient documentation

## 2023-03-09 DIAGNOSIS — Z992 Dependence on renal dialysis: Secondary | ICD-10-CM | POA: Diagnosis not present

## 2023-03-09 DIAGNOSIS — E1122 Type 2 diabetes mellitus with diabetic chronic kidney disease: Secondary | ICD-10-CM | POA: Diagnosis not present

## 2023-03-09 DIAGNOSIS — R45850 Homicidal ideations: Secondary | ICD-10-CM | POA: Diagnosis not present

## 2023-03-09 DIAGNOSIS — N186 End stage renal disease: Secondary | ICD-10-CM | POA: Diagnosis not present

## 2023-03-09 DIAGNOSIS — Z8673 Personal history of transient ischemic attack (TIA), and cerebral infarction without residual deficits: Secondary | ICD-10-CM | POA: Diagnosis not present

## 2023-03-09 DIAGNOSIS — F333 Major depressive disorder, recurrent, severe with psychotic symptoms: Secondary | ICD-10-CM | POA: Insufficient documentation

## 2023-03-09 NOTE — ED Provider Notes (Signed)
Patient on dialysis with some cognitive deficits on exam (Reports receiving dialysis today but unclear if this is true), placed under IVC due to acute psychosis and reporting HI toward mother; patient being sent to ED for medical clearance as well as lack of bed availability at [facility] at this time.  NP contact EDP for transfer to ED.  Plan: -psychiatric consult once patient is in ED - maintain on 1:1 and assault precautions -need collateral regarding patient's current medication list and appropriate antipsychotic to start as patient is unable to tell me which medications she is suppose to be taking currently (appears to have been noncompliant for the past month); patient would benefit from being restarted on an antipsychotic medication (previously reported diagnosis of schizoaffective disorder on chart review)  Miguel Rota MD

## 2023-03-09 NOTE — Discharge Instructions (Addendum)
Transfer to the [facility] ER

## 2023-03-09 NOTE — ED Notes (Signed)
Pt wheelchair bound assisted to bathroom by [facility] [place], partial assist.

## 2023-03-09 NOTE — Progress Notes (Signed)
   03/09/23 1558  [facility] Triage Screening (Walk-ins at [facility] only)  How Did You Hear About [name]? Self  What Is the Reason for Your Visit/Call Today? Patient is a 63 year old female that presents this date as a voluntary walk in requesting assistance with medication management. Patient reports she was recently discharged from [facility] on 1/29 stating while she was in the hospital they discontinued her psychiatric medications (See MAR). Per chart review patient has a past history significant for end-stage renal disease and receives dialysis. Patient reports being typically wheelchair-bound due to paraparesis due to neuropathy. Patient appears to be confused in reference to which medications were discontinued or where she received those medications from prior to her hospital admission. Patient states she has most recently been experiencing AVH although is vague in reference to content reporting, "just hearing and seeing things." Patient denies any HI or SI.  How Long Has This Been Causing You Problems? 1 wk - 1 month  Have You Recently Had Any Thoughts About Hurting Yourself? No  Are You Planning to Commit Suicide/Harm Yourself At This time? No  Have you Recently Had Thoughts About Hurting Someone Karolee Ohs? No  Are You Planning To Harm Someone At This Time? No  Physical Abuse Denies  Verbal Abuse Denies  Sexual Abuse Denies  Exploitation of patient/patient's resources Denies  Self-Neglect Denies  Possible abuse reported to: Other (Comment) (NA)  Are you currently experiencing any auditory, visual or other hallucinations? Yes  Please explain the hallucinations you are currently experiencing: Pt reports ongoing AVH although is vague in reference to content  Have You Used Any Alcohol or Drugs in the Past 24 Hours? No  Do you have any current medical co-morbidities that require immediate attention? No  Clinician description of patient physical appearance/behavior: Patient presents slightly disorganized  What Do You  Feel Would Help You the Most Today? Medication(s)  If access to [facility] was not available, would you have sought care in the Emergency Department? No  Determination of Need Routine (7 days)  Options For Referral Other: Comment (To be determined)

## 2023-03-09 NOTE — ED Provider Notes (Addendum)
Behavioral Health Urgent Care Medical Screening Exam  Patient Name: Tiffany [name] MRN: [id number] Date of Evaluation: 03/09/23 Chief Complaint:  Worsening paranoia, depressive symptoms & HI Diagnosis:  Final diagnoses:  MDD (major depressive disorder), recurrent, severe, with psychosis (HCC)   History of Present illness: Tiffany [name] is a 63 y.o. female with history of CHF, COPD, CKD stage 5, diabetes, stroke presents today to this behavioral health urgent care Center for worsening depressive symptoms, requesting medication management.  Reports that since her discharge from the hospital in January for a flu related hospitalization, she has not been on her mental health medications, and her mental health has significantly declined, leading to worsening paranoia, depressive symptoms, homicidal ideations toward her mother.  She reports that the homicidal ideations have been worsening so much so that she has felt like using her fist to strike her mother in her face.  She reports that her mother is talking about her, telling other family members her "business", because her mother "a control freak", states that her mother goes into her private areas such as her belongings, controls her every move, states that her mother tells her everything that she needs to do, and she does not feel comfortable being around her mother.  Patient is presenting with delusions of persecution, paranoia, she reports being suspicious of people, states that she does not trust people, states that her children and her mother talk about her, she states that because of what is going on, she no longer wants to be alive.  She presents with passive suicidal ideations.  She states that she came in today because she wants to be restarted on her medications, she reports other stressors as her memory is slowly declining, her physical health also declining, no longer having her aid to help her with ADLs given that she is in a wheelchair,  and her mother controlling her every move, and not being on her medications for mental health.  Patient reports a history of bipolar disorder and GAD.  As per chart review she is on Remeron, Seroquel, escitalopram, prazosin, and states that she has not been on this medications since January 29.  Chart reviewed, seems like patient does not have a mental health provider to refill his medications.  Given that she is on dialysis, it would be challenging to hospitalize her in a behavioral health Hospital, that does not have access to medical care.  We will therefore transfer patient to a medical hospital, where she will have access to consult from the psychiatry team, as well as receive care from the medical team during hospitalization.  Patient states that she receives dialysis 3 days a week on Mondays, Wednesdays, and Fridays.  She shares that she came from dialysis trip to this behavioral health urgent care.  [name] presented today with passive SI, denies having a plan or intent, she presents with HI towards her mother, with a plan to hit her in the face, with her fist.  Denies auditory or visual hallucinations.  Presents with paranoia, presents with delusional thinking. Denies first rank symptoms. Patient is seated in a wheelchair and is Ox4, but has trouble recalling some answers to questions, and is seated in a wheelchair.   Flowsheet Row ED to Hosp-Admission (Discharged) from 02/13/2023 in [facility] ED from 02/10/2023 in [facility] Emergency Department at [facility] Admission (Discharged) from 02/01/2023 in [facility] CARDIAC CATH LAB  C-SSRS RISK CATEGORY No Risk No Risk  No Risk       Psychiatric Specialty Exam  Presentation  General Appearance:Disheveled  Eye Contact:Fair  Speech:Clear and Coherent  Speech Volume:Normal  Handedness:Right   Mood and Affect  Mood: Depressed  Affect: Congruent   Thought Process  Thought  Processes: Coherent  Descriptions of Associations:Intact  Orientation:Full (Time, Place and Person)  Thought Content:Logical  Diagnosis of Schizophrenia or Schizoaffective disorder in past: Yes  Duration of Psychotic Symptoms: Greater than six months  Hallucinations:None  Ideas of Reference:None  Suicidal Thoughts:Yes, Passive Without Plan  Homicidal Thoughts:No Without Intent; With Plan   Sensorium  Memory: Immediate Fair  Judgment: Fair  Insight: Fair   Chartered certified accountant: Fair  Attention Span: Fair  Recall:Poor  Fund of Knowledge:Fair  Language: Estate agent Activity: Other (comment) (seated in a wheelchair, unable to assess)  Assets  Assets: Resilience  Sleep  Sleep: Poor  Number of hours: No data recorded  Physical Exam: Physical Exam Vitals reviewed.  HENT:     Head: Normocephalic.  Eyes:     Pupils: Pupils are equal, round, and reactive to light.  Musculoskeletal:        General: Normal range of motion.  Neurological:     General: No focal deficit present.     Mental Status: [name] oriented to person, place, and time.    Review of Systems  Psychiatric/Behavioral:  Positive for depression and suicidal ideas (passive). Negative for hallucinations, memory loss and substance abuse. The patient is nervous/anxious and has insomnia.   All other systems reviewed and are negative.  Blood pressure 127/88, pulse 87, temperature 98.4 F (36.9 C), resp. rate 18, SpO2 99%. There is no height or weight on file to calculate BMI.  Musculoskeletal: Strength & Muscle Tone: within normal limits Gait & Station: normal Patient leans: N/A  [facility] MSE Discharge Disposition for Follow up and Recommendations: Based on my evaluation the patient appears to have an emergency medical condition for which I recommend the patient be transferred to the emergency department for further evaluation.   We will  therefore transfer patient to a medical hospital, where she will have access to consult from the psychiatry team, as well as receive care from the medical team during hospitalization. Report called to Dr Elayne Snare at the [facility].  Starleen Blue, NP 03/09/2023, 9:24 PM

## 2023-03-10 ENCOUNTER — Emergency Department (HOSPITAL_COMMUNITY): Payer: PPO

## 2023-03-10 ENCOUNTER — Other Ambulatory Visit: Payer: Self-pay

## 2023-03-10 ENCOUNTER — Emergency Department (HOSPITAL_COMMUNITY)
Admission: EM | Admit: 2023-03-10 | Discharge: 2023-03-14 | Disposition: A | Discharge status: Home / Self Care | Payer: PPO | Attending: Emergency Medicine | Admitting: Emergency Medicine

## 2023-03-10 ENCOUNTER — Encounter (HOSPITAL_COMMUNITY): Payer: Self-pay

## 2023-03-10 DIAGNOSIS — Z91010 Allergy to peanuts: Secondary | ICD-10-CM | POA: Insufficient documentation

## 2023-03-10 DIAGNOSIS — I130 Hypertensive heart and chronic kidney disease with heart failure and stage 1 through stage 4 chronic kidney disease, or unspecified chronic kidney disease: Secondary | ICD-10-CM | POA: Diagnosis not present

## 2023-03-10 DIAGNOSIS — E1122 Type 2 diabetes mellitus with diabetic chronic kidney disease: Secondary | ICD-10-CM | POA: Diagnosis not present

## 2023-03-10 DIAGNOSIS — F29 Unspecified psychosis not due to a substance or known physiological condition: Secondary | ICD-10-CM | POA: Insufficient documentation

## 2023-03-10 DIAGNOSIS — F333 Major depressive disorder, recurrent, severe with psychotic symptoms: Secondary | ICD-10-CM | POA: Diagnosis not present

## 2023-03-10 DIAGNOSIS — N1832 Chronic kidney disease, stage 3b: Secondary | ICD-10-CM | POA: Insufficient documentation

## 2023-03-10 DIAGNOSIS — J449 Chronic obstructive pulmonary disease, unspecified: Secondary | ICD-10-CM | POA: Diagnosis not present

## 2023-03-10 DIAGNOSIS — J45909 Unspecified asthma, uncomplicated: Secondary | ICD-10-CM | POA: Insufficient documentation

## 2023-03-10 DIAGNOSIS — I5033 Acute on chronic diastolic (congestive) heart failure: Secondary | ICD-10-CM | POA: Insufficient documentation

## 2023-03-10 DIAGNOSIS — R45850 Homicidal ideations: Secondary | ICD-10-CM | POA: Insufficient documentation

## 2023-03-10 DIAGNOSIS — Z79899 Other long term (current) drug therapy: Secondary | ICD-10-CM | POA: Insufficient documentation

## 2023-03-10 DIAGNOSIS — F22 Delusional disorders: Secondary | ICD-10-CM | POA: Insufficient documentation

## 2023-03-10 LAB — COMPREHENSIVE METABOLIC PANEL
ALT: 16 U/L (ref 0–44)
AST: 24 U/L (ref 15–41)
Albumin: 2.9 g/dL — ABNORMAL LOW (ref 3.5–5.0)
Alkaline Phosphatase: 143 U/L — ABNORMAL HIGH (ref 38–126)
Anion gap: 16 — ABNORMAL HIGH (ref 5–15)
BUN: 14 mg/dL (ref 8–23)
CO2: 20 mmol/L — ABNORMAL LOW (ref 22–32)
Calcium: 9.1 mg/dL (ref 8.9–10.3)
Chloride: 93 mmol/L — ABNORMAL LOW (ref 98–111)
Creatinine, Ser: 3.15 mg/dL — ABNORMAL HIGH (ref 0.44–1.00)
GFR, Estimated: 16 mL/min — ABNORMAL LOW (ref >60)
Glucose, Bld: 91 mg/dL (ref 70–99)
Potassium: 3.3 mmol/L — ABNORMAL LOW (ref 3.5–5.1)
Sodium: 129 mmol/L — ABNORMAL LOW (ref 135–145)
Total Bilirubin: 0.4 mg/dL (ref 0.0–1.2)
Total Protein: 7.3 g/dL (ref 6.5–8.1)

## 2023-03-10 LAB — CBC
HCT: 32.8 % — ABNORMAL LOW (ref 36.0–46.0)
Hemoglobin: 10.9 g/dL — ABNORMAL LOW (ref 12.0–15.0)
MCH: 29.6 pg (ref 26.0–34.0)
MCHC: 33.2 g/dL (ref 30.0–36.0)
MCV: 89.1 fL (ref 80.0–100.0)
Platelets: 288 10*3/uL (ref 150–400)
RBC: 3.68 MIL/uL — ABNORMAL LOW (ref 3.87–5.11)
RDW: 15.7 % — ABNORMAL HIGH (ref 11.5–15.5)
WBC: 5.9 10*3/uL (ref 4.0–10.5)
nRBC: 0 % (ref 0.0–0.2)

## 2023-03-10 LAB — SALICYLATE LEVEL: Salicylate Lvl: <7 mg/dL — ABNORMAL LOW (ref 7.0–30.0)

## 2023-03-10 LAB — ACETAMINOPHEN LEVEL: Acetaminophen (Tylenol), Serum: <10 ug/mL — ABNORMAL LOW (ref 10–30)

## 2023-03-10 LAB — ETHANOL: Alcohol, Ethyl (B): <10 mg/dL (ref <10)

## 2023-03-10 MED ORDER — MELATONIN 5 MG PO TABS
5.0000 mg | ORAL_TABLET | Freq: Every day | ORAL | Status: DC
  Administered 2023-03-10 – 2023-03-13 (×4): 5 mg via ORAL
  Filled 2023-03-10 (×4): qty 1

## 2023-03-10 MED ORDER — DULOXETINE HCL 30 MG PO CPEP
30.0000 mg | ORAL_CAPSULE | Freq: Every day | ORAL | Status: DC
  Administered 2023-03-10 – 2023-03-14 (×5): 30 mg via ORAL
  Filled 2023-03-10 (×5): qty 1

## 2023-03-10 MED ORDER — QUETIAPINE FUMARATE 50 MG PO TABS
50.0000 mg | ORAL_TABLET | Freq: Two times a day (BID) | ORAL | Status: DC
  Administered 2023-03-10 – 2023-03-14 (×9): 50 mg via ORAL
  Filled 2023-03-10 (×6): qty 1
  Filled 2023-03-10: qty 2
  Filled 2023-03-10 (×2): qty 1

## 2023-03-10 MED ORDER — ACETAMINOPHEN 325 MG PO TABS
650.0000 mg | ORAL_TABLET | Freq: Once | ORAL | Status: AC
  Administered 2023-03-10: 650 mg via ORAL
  Filled 2023-03-10: qty 2

## 2023-03-10 MED ORDER — MIRTAZAPINE 15 MG PO TABS
7.5000 mg | ORAL_TABLET | Freq: Every day | ORAL | Status: DC
  Administered 2023-03-10 – 2023-03-13 (×4): 7.5 mg via ORAL
  Filled 2023-03-10 (×4): qty 1

## 2023-03-10 MED ORDER — ESCITALOPRAM OXALATE 10 MG PO TABS
10.0000 mg | ORAL_TABLET | Freq: Every day | ORAL | Status: DC
  Administered 2023-03-10 – 2023-03-14 (×5): 10 mg via ORAL
  Filled 2023-03-10 (×5): qty 1

## 2023-03-10 NOTE — ED Triage Notes (Signed)
 Pt brought from [facility] by EMS d/t [facility] not accepting patient since she is wheelchair bound. Pt under IVC papers stating she is "psychotic, hearing voices/seeing things, homicidal towards mother". Pt cooperative for EMS. NAD distress noted.

## 2023-03-10 NOTE — ED Notes (Addendum)
 Attempted to get labs on Pt x2 with no success. Pt refused any further blood draws by this NT and phlebotomy stating to "get the ultrasound". RN aware

## 2023-03-10 NOTE — Progress Notes (Signed)
 LCSW Progress Note  [id number]   Tiffany [name]  03/10/2023  2:36 PM  Description:   Inpatient Psychiatric Referral  Patient was recommended inpatient per Arsenio Loader NP). There are no available beds at [facility], per [facility] Rosey Bath RN). Patient was referred to the following out of network facilities:   Destination  Service Provider Address Phone Fax  [facility] [address], [place] [address] [phone]  [facility] [address] [place], [place] [address] [phone]  [facility] [address] [place], [place] [address] [phone]  [facility] [address] [place] [address] [phone]  [facility] [address], [place] [address] [phone]  [facility] [address] [place] [address] [phone]  [facility] [address] [place] [address] [phone]  [facility] [address] [place] [address] [phone]  [facility] [address] [place] [address] [phone]      Situation ongoing, [name] to continue following and update chart as more information becomes available.      [place] Klutts, MSW, LCSW  03/10/2023 2:36 PM

## 2023-03-10 NOTE — ED Notes (Signed)
 Pt has been IVCd waiting on the [facility]

## 2023-03-10 NOTE — ED Notes (Signed)
 Patient transported to CT

## 2023-03-10 NOTE — ED Provider Notes (Signed)
 [place] EMERGENCY DEPARTMENT AT [facility] Provider Note   CSN: [id number] Arrival date & time: 03/10/23  6962     History  Chief Complaint  Patient presents with   Psychiatric Evaluation   Homicidal   Suicidal    Tiffany [name] is a 63 y.o. female.  Patient transferred to the emergency department from behavioral health urgent care.  Patient presented there under IVC.  Patient noted to be delusional and paranoid, hearing voices and having thoughts about killing her mother.  Patient reports that this has been "ongoing for years".       Home Medications Prior to Admission medications   Medication Sig Start Date End Date Taking? Authorizing Provider  albuterol (VENTOLIN HFA) 108 (90 Base) MCG/ACT inhaler INHALE 2 PUFFS INTO THE LUNGS EVERY 6 HOURS AS NEEDED FOR WHEEZING OR SHORTNESS OF BREATH 10/24/22   Tiffany Kocher, DO  albuterol (VENTOLIN HFA) 108 (90 Base) MCG/ACT inhaler Inhale 2 puffs into the lungs every 4 (four)- 6 (six) hours. 02/27/23     alteplase (CATHFLO ACTIVASE) 2 MG injection 2 mg by Intracatheter route once as needed for open catheter. 11/22/22   Sarina Ill, DO  amLODipine (NORVASC) 10 MG tablet Take 1 tablet (10 mg total) by mouth daily. 02/26/23     atorvastatin (LIPITOR) 20 MG tablet Take 1 tablet (20 mg total) by mouth daily. 10/09/22   McDiarmid, Leighton Roach, MD  azithromycin (ZITHROMAX) 250 MG tablet Take 2  tablets by mouth  on day one then 1 tablet once a day for four days. 02/27/23     calcitRIOL (ROCALTROL) 0.5 MCG capsule Take 1 capsule (0.5 mcg total) by mouth daily. 10/09/22   McDiarmid, Leighton Roach, MD  carvedilol (COREG) 12.5 MG tablet Take 1 tablet (12.5 mg total) by mouth 2 (two) times daily. 02/26/23     carvedilol (COREG) 3.125 MG tablet Take 1 tablet (3.125 mg total) by mouth 2 (two) times daily. 02/20/23   Leroy Sea, MD  cyclobenzaprine (FLEXERIL) 10 MG tablet Take 1 tablet (10 mg total) by mouth 3 (three) times daily. 10/09/22    McDiarmid, Leighton Roach, MD  cyclobenzaprine (FLEXERIL) 10 MG tablet take 1 tablet by mouth three times daily as needed 02/06/23     dicyclomine (BENTYL) 10 MG capsule Take 1 capsule (10 mg total) by mouth 3 (three) times daily before meals. 10/09/22   McDiarmid, Leighton Roach, MD  DULoxetine (CYMBALTA) 30 MG capsule Take 1 capsule (30 mg total) by mouth daily. 10/09/22   McDiarmid, Leighton Roach, MD  epoetin alfa (EPOGEN) 10000 UNIT/ML injection Inject 1 mL (10,000 Units total) into the vein every Monday, Wednesday, and Friday with hemodialysis. 11/22/22   Sarina Ill, DO  escitalopram (LEXAPRO) 10 MG tablet Take 10 mg by mouth daily. 12/27/22   [provider]  escitalopram (LEXAPRO) 20 MG tablet Take 1 tablet (20 mg total) by mouth daily. 10/09/22   McDiarmid, Leighton Roach, MD  ezetimibe (ZETIA) 10 MG tablet Take 1 tablet (10 mg total) by mouth daily. 10/09/22   McDiarmid, Leighton Roach, MD  ferrous sulfate 325 (65 FE) MG tablet Take 1 tablet (325 mg total) by mouth every other day. 10/09/22 10/04/23  McDiarmid, Leighton Roach, MD  Fluticasone-Umeclidin-Vilant (TRELEGY ELLIPTA) 100-62.5-25 MCG/ACT AEPB INHALE 1 PUFF INTO THE LUNGS DAILY 03/20/22   Tiffany Kocher, DO  folic acid (FOLVITE) 1 MG tablet Take 1 tablet (1 mg total) by mouth daily. 10/09/22   McDiarmid, Leighton Roach, MD  HYDROcodone-acetaminophen (NORCO) 5-325 MG tablet Take 1 tablet by mouth every 6 (six) hours as needed for moderate pain (pain score 4-6). 01/03/23   Lars Mage, PA-C  ibuprofen (ADVIL) 200 MG tablet Take 400 mg by mouth 3 (three) times daily.    [provider]  lamoTRIgine (LAMICTAL) 100 MG tablet Take 1 tablet (100 mg total) by mouth daily. 02/26/23     lidocaine (LIDODERM) 5 % Place 1 patch onto the skin daily. Remove & Discard patch within 12 hours or as directed by MD 02/20/23   Leroy Sea, MD  loperamide (IMODIUM) 2 MG capsule Take 1 capsule (2 mg total) by mouth 2 (two) times daily. 10/09/22   McDiarmid, Leighton Roach, MD  melatonin 5 MG  TABS Take 1 tablet (5 mg total) by mouth at bedtime. 11/22/22   Sarina Ill, DO  metolazone (ZAROXOLYN) 2.5 MG tablet Take 2.5 mg by mouth every Monday, Wednesday, and Friday. 11/16/22   [provider]  mirtazapine (REMERON) 7.5 MG tablet Take 7.5 mg by mouth 2 (two) times daily. States takes 2 tablets po at bedtime. 12/30/22   [provider]  omeprazole (PRILOSEC) 40 MG capsule Take 1 capsule (40 mg total) by mouth every morning. 10/09/22   McDiarmid, Leighton Roach, MD  potassium chloride SA (KLOR-CON M) 20 MEQ tablet Take 20 mEq by mouth See admin instructions. Take 1 tablet by mouth for 3 days    [provider]  prazosin (MINIPRESS) 2 MG capsule Take 1 capsule (2 mg total) by mouth at bedtime. 02/26/23     promethazine (PHENERGAN) 6.25 MG/5ML solution Take 5 ml by mouth every 4-6 hours as needed for cough and nausea 02/06/23     QUEtiapine (SEROQUEL) 100 MG tablet Take 1 tablet (100 mg total) by mouth in the morning AND 2 tablets (200 mg total) every evening. 02/06/23   [name], MD  torsemide (DEMADEX) 20 MG tablet Take 1 tablet (20 mg total) by mouth 2 (two) times daily. Patient taking differently: Take 20 mg by mouth 2 (two) times daily. Pt states she is taking 3 times a day 02/13/23     torsemide (DEMADEX) 20 MG tablet Take 1 tablet (20 mg total) by mouth 2 (two) times daily. 02/20/23         Allergies    Asa [aspirin], Hydrocodone, Latuda [lurasidone hcl], Lurasidone, Magnesium-containing compounds, Prednisone, Ultram [tramadol], Codeine, Desyrel [trazodone], Hydralazine, Peanut (diagnostic), Peanut-containing drug products, Topamax [topiramate], Sulfa antibiotics, and Tape    Review of Systems   Review of Systems  Physical Exam Updated Vital Signs BP 102/67 (BP Location: Left Arm)   Pulse 79   Temp 98.4 F (36.9 C) (Oral)   Resp 20   Ht 5\' 3"  (1.6 m)   Wt 89.8 kg   SpO2 97%   BMI 35.07 kg/m  Physical Exam Vitals and nursing note reviewed.   Constitutional:      General: [name] not in acute distress.    Appearance: [name] well-developed.  HENT:     Head: Normocephalic and atraumatic.     Mouth/Throat:     Mouth: Mucous membranes are moist.  Eyes:     General: Vision grossly intact. Gaze aligned appropriately.     Extraocular Movements: Extraocular movements intact.     Conjunctiva/sclera: Conjunctivae normal.  Cardiovascular:     Rate and Rhythm: Normal rate and regular rhythm.     Pulses: Normal pulses.     Heart sounds: Normal heart  sounds, S1 normal and S2 normal. No murmur heard.    No friction rub. No gallop.  Pulmonary:     Effort: Pulmonary effort is normal. No respiratory distress.     Breath sounds: Normal breath sounds.  Abdominal:     General: Bowel sounds are normal.     Palpations: Abdomen is soft.     Tenderness: There is no abdominal tenderness. There is no guarding or rebound.     Hernia: No hernia is present.  Musculoskeletal:        General: No swelling.     Cervical back: Full passive range of motion without pain, normal range of motion and neck supple. No spinous process tenderness or muscular tenderness. Normal range of motion.     Right lower leg: No edema.     Left lower leg: No edema.  Skin:    General: Skin is warm and dry.     Capillary Refill: Capillary refill takes less than 2 seconds.     Findings: No ecchymosis, erythema, rash or wound.  Neurological:     General: No focal deficit present.     Mental Status: [name] alert and oriented to person, place, and time.     GCS: GCS eye subscore is 4. GCS verbal subscore is 5. GCS motor subscore is 6.     Cranial Nerves: Cranial nerves 2-12 are intact.     Sensory: Sensation is intact.     Motor: Motor function is intact.     Coordination: Coordination is intact.  Psychiatric:        Attention and Perception: Attention normal.        Mood and Affect: Mood normal.        Speech: Speech normal.        Behavior: Behavior normal.         Thought Content: Thought content includes homicidal ideation.     ED Results / Procedures / Treatments   Labs (all labs ordered are listed, but only abnormal results are displayed) Labs Reviewed  SALICYLATE LEVEL - Abnormal; Notable for the following components:      Result Value   Salicylate Lvl <7.0 (*)    All other components within normal limits  ACETAMINOPHEN LEVEL - Abnormal; Notable for the following components:   Acetaminophen (Tylenol), Serum <10 (*)    All other components within normal limits  CBC - Abnormal; Notable for the following components:   RBC 3.68 (*)    Hemoglobin 10.9 (*)    HCT 32.8 (*)    RDW 15.7 (*)    All other components within normal limits  COMPREHENSIVE METABOLIC PANEL - Abnormal; Notable for the following components:   Sodium 129 (*)    Potassium 3.3 (*)    Chloride 93 (*)    CO2 20 (*)    Creatinine, Ser 3.15 (*)    Albumin 2.9 (*)    Alkaline Phosphatase 143 (*)    GFR, Estimated 16 (*)    Anion gap 16 (*)    All other components within normal limits  ETHANOL  RAPID URINE DRUG SCREEN, HOSP PERFORMED  URINALYSIS, ROUTINE W REFLEX MICROSCOPIC  COMPREHENSIVE METABOLIC PANEL    EKG None  Radiology CT HEAD WO CONTRAST ( ) Result Date: 03/10/2023 CLINICAL DATA:  63 year old female with altered mental status. EXAM: CT HEAD WITHOUT CONTRAST TECHNIQUE: Contiguous axial images were obtained from the base of the skull through the vertex without intravenous contrast. RADIATION DOSE REDUCTION: This exam was performed according  to the departmental dose-optimization program which includes automated exposure control, adjustment of the mA and/or kV according to patient size and/or use of iterative reconstruction technique. COMPARISON:  Brain MRI 10/19/2018.  Head CT 06/21/2022. FINDINGS: Brain: Cerebral volume remains normal for age. No midline shift, ventriculomegaly, mass effect, evidence of mass lesion, intracranial hemorrhage or evidence of  cortically based acute infarction. Patchy mild to moderate for age mostly periventricular cerebral white matter hypodensity is stable. Stable gray-white matter differentiation throughout the brain. Vascular: Calcified atherosclerosis at the skull base. No suspicious intracranial vascular hyperdensity. Skull: Stable and intact. Sinuses/Orbits: Visualized paranasal sinuses and mastoids are clear. Other: Visualized orbits and scalp soft tissues are within normal limits. IMPRESSION: 1. No acute intracranial abnormality. 2. Stable mild to moderate for age white matter changes most commonly due to chronic small vessel disease. Electronically Signed   By: Odessa Fleming M.D.   On: 03/10/2023 06:05    Procedures Procedures    Medications Ordered in ED Medications - No data to display  ED Course/ Medical Decision Making/ A&P                                 Medical Decision Making Amount and/or Complexity of Data Reviewed Labs: ordered. Radiology: ordered.   Patient seen at behavioral health urgent care and it was felt that she would require psychiatric evaluation and likely hospitalization.  Patient's general medical condition is poor, end-stage renal disease on dialysis.  She was sent to the ED for further medical clearance.  Here in the ED she is awake, alert and oriented, still endorses wanting to harm her mother.  CT head unremarkable.  No focal findings on exam.  Reviewing her records reveals extensive psychiatric history.  Will require psychiatric evaluation.  No signs of volume overload.  Labs are stable, does not require dialysis at this time.  [name] due for dialysis on Monday, will need to follow-up.        Final Clinical Impression(s) / ED Diagnoses Final diagnoses:  Psychosis, unspecified psychosis type [facility])    Rx / DC Orders ED Discharge Orders     None         Andreyah Natividad, Canary Brim, MD 03/10/23 [phone]

## 2023-03-10 NOTE — Consult Note (Addendum)
 [facility] Psychiatric Consult Initial  Patient Name: .Tiffany [name]  MRN: [id number]  DOB: Aug 29, 1960  Consult Order details:  Orders (From admission, onward)     Start     Ordered   03/10/23 0720  CONSULT TO CALL ACT TEAM       Ordering Provider: Gilda Crease, MD  Provider:  (Not yet assigned)  Question:  Reason for Consult?  Answer:  Psych consult   03/10/23 0720             Mode of Visit: In person    Psychiatry Consult Evaluation  Service Date: March 10, 2023 LOS:  LOS: 0 days  Chief Complaint: "I need to get back on my medicine"   Primary Psychiatric Diagnoses  MDD w/ psychotic features  Assessment   Tiffany [name] is a 63 y.o. AA female with a past psychiatric history of MDD with and without psychotic features, PTSD, schizoaffective disorder, and schizophrenia, with pertinent medical comorbidities/history that include hypertension, end-stage renal disease on hemodialysis Monday Wednesday Friday (stage 5), congestive heart failure, diabetes, COPD, obesity, osteoarthritis, and GERD, who presented this encounter as a transfer from the behavioral health urgent care, due to needing higher level of care for the patient's medical comorbidities, and after evaluation was performed and deemed that the patient needs  inpatient mental health hospitalization.  Patient initially presented to the behavioral health urgent care for endorsements of worsening paranoia, depressive symptoms, and homicidal ideations.  Patient currently is involuntarily committed at this time, but medically clear, per EDP team.  Upon evaluation, patient presents with symptomology that is most consistent with a unipolar major depressive disorder with psychotic features. Evidence of this is supported by paranoia and vague endorsements of hallucinations that are appreciable when the patient is experiencing worsening and severe depression, as well as the patient's endorsements of a variety of  depressive symptomology.  Given the patient's continued endorsements of homicidal ideations, paranoid ideations, significant depressive symptomology, desire for restarting mental health medications, and expressed need for inpatient mental health hospitalization, will continue to recommend inpatient mental health hospitalization at this time.  Discussed with patient restarting the patient's previous outpatient psychiatric medications, but with dosage reductions initially for safety, as the patient has been off her medications for a meaningful period of time.  Patient verbalized understanding, in agreement with dosage reductions to start.  Diagnoses:  Active Hospital problems: Principal Problem:   MDD (major depressive disorder), recurrent, severe, with psychosis (HCC)    Plan   ## Psychiatric Medication Recommendations:   -Continue current outpatient psychiatric medication regimen listed below, but with modifications below --> Seroquel 50 mg p.o. twice daily, morning and evening --> Mirtazapine 7.5 mg p.o. nightly --> Melatonin 5 mg p.o. nightly --> Lexapro 10 mg p.o. daily --> Duloxetine 30 mg p.o. daily  ## Medical Decision Making Capacity: Not specifically addressed in this encounter  ## Further Work-up: None at this time  ## Disposition:-- We recommend inpatient psychiatric hospitalization after medical hospitalization. Patient has been involuntarily committed on 03/09/2023.   ## Behavioral / Environmental:   -Delirium Precautions: Delirium Interventions for Nursing and Staff: - RN to open blinds every AM. - To Bedside: Glasses, hearing aide, and pt's own shoes. Make available to patients. when possible and encourage use. - Encourage po fluids when appropriate, keep fluids within reach. - OOB to chair with meals. - Passive ROM exercises to all extremities with AM & PM care. - RN to assess orientation to person, time and place  QAM and PRN. - Recommend extended visitation hours with  familiar family/friends as feasible. - Staff to minimize disturbances at night. Turn off television when pt asleep or when not in use.    -Standard safety/agitation precautions.    ## Safety and Observation Level:  - Based on my clinical evaluation, I estimate the patient to be at low risk of self harm in the current setting. - At this time, we recommend  1:1 Observation. This decision is based on my review of the chart including patient's history and current presentation, interview of the patient, mental status examination, and consideration of suicide risk including evaluating suicidal ideation, plan, intent, suicidal or self-harm behaviors, risk factors, and protective factors. This judgment is based on our ability to directly address suicide risk, implement suicide prevention strategies, and develop a safety plan while the patient is in the clinical setting. Please contact our team if there is a concern that risk level has changed.  CSSR Risk Category:C-SSRS RISK CATEGORY: Low Risk  Suicide Risk Assessment: Patient has following modifiable risk factors for suicide: untreated depression, medication noncompliance, and lack of access to outpatient mental health resources, which we are addressing by treatment recommendations. Patient has following non-modifiable or demographic risk factors for suicide: psychiatric hospitalization Patient has the following protective factors against suicide: Supportive family, Cultural, spiritual, or religious beliefs that discourage suicide, no history of suicide attempts, and no history of NSSIB  Thank you for this consult request. Recommendations have been communicated to the primary team.  We will continue to follow at this time.   Lenox Ponds, NP       History of Present Illness   Tiffany [name] is a 63 y.o. AA female with a past psychiatric history of MDD with and without psychotic features, PTSD, schizoaffective disorder, and schizophrenia, with  pertinent medical comorbidities/history that include hypertension, end-stage renal disease on hemodialysis Monday Wednesday Friday (stage 5), congestive heart failure, diabetes, COPD, obesity, osteoarthritis, and GERD, who presented this encounter as a transfer from the behavioral health urgent care, due to needing higher level of care for the patient's medical comorbidities, and after evaluation was performed and deemed that the patient needs  inpatient mental health hospitalization.  Patient initially presented to the behavioral health urgent care for endorsements of worsening paranoia, depressive symptoms, and homicidal ideations.  Patient currently is involuntarily committed at this time, but medically clear, per EDP team.  Patient seen today at the [facility] emergency department for face-to-face psychiatric evaluation.  Upon evaluation, patient endorses that she has presented this encounter because she has been off of her behavioral health medications since previously hospitalized for the flu, and since then, states that she has been having a recrudescence of growing and worsening depressive symptomology, paranoid ideations, auditory and visual hallucinations intermittently, and now homicidal ideations towards her mother.  Patient endorses that she does not know why she stopped taking her mental health medications, but that she has desire to restart them, including participating in inpatient mental health hospitalization to, "distance myself from hurting my mother, she has been making me so angry lately".  Expanding on endorsements of familial conflict between herself and her mother, patient endorses that her mother lately has been trying to, "set me up", reports that her mother is always talking about her, "business" to "anyone and everyone", reports that her mother goes through her things, controls her every move, abuses her emotionally, and that, "she just tries to wreck my life, and am tired of  it,  I'ma mess her up if I don't get some help."   Patient formally asked about homicidal ideations, states that she has thoughts about hurting her mother, but beyond this, does not give any further detail.  Patient endorses no suicidal ideations, but vaguely endorses that since she has stopped her medications, she has been having difficulties with finding reasons to live.  When formally asked about depressive symptomology, endorses anhedonia, troubles with concentration/focus, increased irritability/agitation, increased fatigue, troubles with sleep, and troubles with eating and appetite.  Patient additionally endorses anxiety has been very troublesome lately, states that at times it is, "through the roof".  Expanding on endorsements of auditory visual hallucinations intermittently experienced, vaguely reports that at times she feels that she has heard or saw things that may be were not there, both of which further escalate her feelings of paranoia.  Patient orientation is intact, no concerns for fluctuations in consciousness.  Patient endorses no auditory and or visual hallucinations notably during evaluation, and objectively, does not appear to be presenting with psychotic features.  Patient endorses no drugs, alcohol, and/or tobacco use, but does state she has a history of using cocaine, "a few times" a long time ago, as well as that she previously was a heavy smoker of tobacco, but none in recent years.  UDS appreciably not resulted, but BAL unremarkable.  Patient endorses no current outpatient psychiatry provider for medication management or therapy, and is unable to give historical count of utilizing outpatient services for mental health.  Patient endorses x 1 inpatient mental health hospitalization recently at [facility], which is consistent with chart review.  Patient endorses no history of suicide attempts and/or self-injurious behavior.  Patient endorses current mood is depressed and frustrated, presents  with a mildly constricted to neutral affect with irritable edge.  Patient endorses that she can care for herself almost entirely, but does have trouble with cooking, as she cannot use the stove, being that she is in a wheelchair.   Review of Systems  Constitutional:  Positive for malaise/fatigue and weight loss.  Musculoskeletal:  Positive for myalgias.  Neurological:  Positive for weakness. Negative for tremors, seizures, loss of consciousness and headaches.  Psychiatric/Behavioral:  Positive for depression and hallucinations (Reports vaguely since stopping medications she hears and sees things at times she knows arn't real). Negative for substance abuse and suicidal ideas. The patient is nervous/anxious and has insomnia.   All other systems reviewed and are negative.   Psychiatric and Social History  Psychiatric History:   Information collected from chart review/patient  Prev Dx/Sx: MDD with and without psychotic features, PTSD, schizoaffective disorder, and schizophrenia Current Psych Provider: None Home Meds (current): None currently, but recently Seroquel, Cymbalta, Lexapro, melatonin, mirtazapine Previous Med Trials: Seroquel, Cymbalta, Lexapro, melatonin, mirtazapine Therapy: Yes, [facility], 11/10/2022, 9 outpatient  Prior Psych Hospitalization: Yes, [facility], 11/10/2022 Prior Self Harm: None endorsed or reported Prior Violence: None endorsed or reported, but has current homicidal ideations  Family Psych History: None endorsed or reported Family Hx suicide: None endorsed or reported  Social History:  Developmental Hx: WDL Educational Hx: High school Occupational Hx: Retired, on Designer, television/film set Hx: None endorsed or reported Living Situation: Lives with son Spiritual Hx: None endorsed or reported  Access to weapons/lethal means: None endorsed or reported   Substance History Alcohol: : None endorsed or reported  Type of alcohol : None endorsed or reported Last Drink :  None endorsed or reported Number of drinks per day : None endorsed or reported  History of alcohol withdrawal seizures : None endorsed or reported History of DT's : None endorsed or reported Tobacco: Previous heavy smoker, none in recent years Illicit drugs: History of cocaine use, none in recent years, unable to further give detail Prescription drug abuse:  None endorsed or reported Rehab hx: None endorsed or reported  Exam Findings  Physical Exam: As below Vital Signs:  Temp:  [98.4 F (36.9 C)-98.6 F (37 C)] 98.4 F (36.9 C) (02/22 0615) Pulse Rate:  [79-87] 79 (02/22 0615) Resp:  [18-20] 20 (02/22 0615) BP: (102-127)/(67-88) 102/67 (02/22 0615) SpO2:  [97 %-99 %] 97 % (02/22 0615) Weight:  [89.8 kg] 89.8 kg (02/22 0230) Blood pressure 102/67, pulse 79, temperature 98.4 F (36.9 C), temperature source Oral, resp. rate 20, height 5\' 3"  (1.6 m), weight 89.8 kg, SpO2 97%. Body mass index is 35.07 kg/m.  Physical Exam Vitals and nursing note reviewed.  Constitutional:      General: [name] not in acute distress.    Appearance: [name] not ill-appearing, toxic-appearing or diaphoretic.  Pulmonary:     Effort: Pulmonary effort is normal.  Skin:    General: Skin is warm and dry.  Neurological:     Mental Status: [name] alert and oriented to person, place, and time.     Motor: No tremor or seizure activity.  Psychiatric:        Attention and Perception: Attention and perception normal. She does not perceive auditory or visual hallucinations.        Mood and Affect: Mood is anxious and depressed.        Speech: Speech normal.        Behavior: Behavior is agitated. Behavior is cooperative.        Thought Content: Thought content is paranoid and delusional (Reports paranoid ideations around mother). Thought content includes homicidal (Reports wanting to go after mother) ideation. Thought content does not include homicidal plan.        Cognition and Memory: Cognition and memory  normal.        Judgment: Judgment normal.     Mental Status Exam: General Appearance: In scrubs, mildly unkept, overweight African-American female  Orientation:  Full (Time, Place, and Person)  Memory:  Immediate;   Fair Recent;   Fair Remote;   Fair  Concentration:  Concentration: Fair and Attention Span: Fair  Recall:  Fair  Attention  Fair  Eye Contact:  Fair  Speech:  Clear and Coherent and Normal Rate  Language:  Fair  Volume:  Normal  Mood: Depressed  Affect:   Mildly constricted  Thought Process:  Coherent, Goal Directed, and Linear  Thought Content:  Logical and Paranoid Ideation  Suicidal Thoughts:  No, but does have increased reduction in ability to articulate reasons to live  Homicidal Thoughts:  Yes.  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  Family History:  The patient's family history includes Anxiety disorder in her sister; Bipolar disorder in her sister; Cancer in her father and  mother; Depression in her brother, mother, sister, and sister; Heart failure in an other family member; Schizophrenia in her sister.  Medical History: Past Medical History:  Diagnosis Date  . Abdominal pain   . Acute encephalopathy 11/21/2017  . Acute exacerbation of CHF (congestive heart failure) (HCC) 03/03/2022  . Acute GI bleeding 03/29/2021  . Acute kidney injury superimposed on chronic kidney disease (HCC) 02/18/2020  . Acute metabolic encephalopathy 02/20/2020  . Acute on  chronic diastolic heart failure (HCC) 02/04/2022  . Acute on chronic renal failure (HCC) 06/02/2022  . Aggressive behavior   . Agitation 11/22/2017  . Anoxic brain injury (HCC) 09/08/2016   C. Arrest due to respiratory failure and COPD exacerbation  . Anxiety   . Arthritis    "all over" (04/10/2016)  . Asthma 10/18/2010  . Binge eating disorder   . Blood loss anemia 04/24/2011  . CAP (community acquired pneumonia) 06/22/2015  . Cardiac arrest (HCC) 09/08/2016   PEA  . Carotid artery stenosis    1-39% bilateral by dopplers 11/2016  . Chronic diastolic (congestive) heart failure (HCC)   . Chronic kidney disease, stage 3 (HCC)   . Chronic kidney disease, stage 3b (HCC) 06/06/2021  . Chronic pain syndrome 06/18/2012  . Chronic post-traumatic stress disorder (PTSD) 05/27/2018  . Chronic respiratory failure with hypoxia and hypercapnia (HCC) 06/22/2015   TRILOGY Vent >AVAPA-ES., Vt target 200-400, Max P 30 , PS max 20 , PS min 6-10 , E Max 6, E Min 4, Rate Auto AVAPS Rate 2 (titrate for pt comfort) , bleed O2 at 5l/m continuous flow .   [name] CKD (chronic kidney disease) 02/03/2022  . Closed displaced fracture of fifth metacarpal bone 03/21/2018  . Cocaine use disorder, severe, in sustained remission (HCC) 12/17/2015  . Complication of anesthesia    decreased bp, decreased heart rate  . COPD (chronic obstructive pulmonary disease) (HCC) 07/08/2014  . Delusional disorder, persecutory type (HCC) 06/26/2021  . Depression   . Diabetic neuropathy (HCC) 04/24/2011  . Difficulty with speech 01/24/2018  . Disorder of nervous system   . Drug abuse (HCC) 11/21/2017  . Dyslipidemia 04/24/2011  . E. coli UTI 02/20/2020  . Elevated troponin 04/28/2012  . Emphysema   . Encephalopathy 11/21/2017  . Essential hypertension 03/22/2016  . Facet arthropathy, lumbar 06/12/2022  . Fibula fracture 07/10/2016  . Frequent falls 10/11/2017  . GERD (gastroesophageal reflux disease)   . GI bleed 03/30/2021   . Gout 04/11/2017  . Heart attack (HCC) 1980s  . Hematochezia   . History of blood transfusion 1994   "couldn't stop bleeding from my period"  . History of drug abuse in remission (HCC) 11/28/2015   Quit in 2017  . Hyperlipidemia 03/31/2021  . Hyperlipidemia LDL goal <70   . Hypocalcemia   . Hypokalemia   . Hypomagnesemia   . Incontinence   . Manic depression (HCC)   . Morbid obesity (HCC) 10/18/2010  . Obstructive sleep apnea 10/18/2010  . On home oxygen therapy    "6L; 24/7" (04/10/2016)  . OSA on CPAP    "wear mask sometimes" (04/10/2016)  . Painless rectal bleeding 12/31/2015  . Paranoid (HCC)    "sometimes; I'm on RX for it" (04/10/2016)  . Periumbilical abdominal pain   . Prolonged Q-T interval on ECG   . Rectal bleeding 12/31/2015  . Recurrent syncope 07/09/2021  . Rhabdomyolysis 06/16/2021  . Schizoaffective disorder, bipolar type (HCC) 04/05/2018  . Seasonal allergies   . Seborrheic keratoses 12/31/2013  .  Seizures (HCC)    "don't know what kind; last one was ~ 1 yr ago" (04/10/2016)  . Sinus bradycardia   . Skin ulcer of sacrum, limited to breakdown of skin (HCC) 03/08/2022  . Spondylolisthesis at L3-L4 level 06/12/2022  . Stroke [facility]) 1980s   denies residual on 04/10/2016  . Thrush 09/19/2013  . Type 2 diabetes mellitus (HCC) 10/18/2010    Surgical History: Past Surgical History:  Procedure Laterality Date  . A/V FISTULAGRAM N/A 02/01/2023   Procedure: A/V Fistulagram;  Surgeon: Tyler Pita, MD;  Location: [facility] INVASIVE CV LAB;  Service: Cardiovascular;  Laterality: N/A;  . AV FISTULA PLACEMENT Left 12/21/2022   Procedure: INSERTION OF LEFT ARM ARTERIOVENOUS (AV) GORE-TEX GRAFT;  Surgeon: Leonie Douglas, MD;  Location: [facility] OR;  Service: Vascular;  Laterality: Left;  . CESAREAN SECTION  1997  . COLONOSCOPY WITH PROPOFOL N/A 04/01/2021   Procedure: COLONOSCOPY WITH PROPOFOL;  Surgeon: Jeani Hawking, MD;  Location: [facility] ENDOSCOPY;  Service: Gastroenterology;   Laterality: N/A;  Rectal bleeding with drop in hemoglobin to 7.2 g/dL  . HERNIA REPAIR    . IR CHOLANGIOGRAM EXISTING TUBE  07/20/2016  . IR PERC CHOLECYSTOSTOMY  05/10/2016  . IR RADIOLOGIST EVAL & MGMT  06/08/2016  . IR RADIOLOGIST EVAL & MGMT  06/29/2016  . IR SINUS/FIST TUBE CHK-NON GI  07/12/2016  . RIGHT/LEFT HEART CATH AND CORONARY ANGIOGRAPHY N/A 06/19/2017   Procedure: RIGHT/LEFT HEART CATH AND CORONARY ANGIOGRAPHY;  Surgeon: Dolores Patty, MD;  Location: [facility] INVASIVE CV LAB;  Service: Cardiovascular;  Laterality: N/A;  . TIBIA IM NAIL INSERTION Right 07/12/2016   Procedure: INTRAMEDULLARY (IM) NAIL RIGHT TIBIA;  Surgeon: Tarry Kos, MD;  Location: [facility] OR;  Service: Orthopedics;  Laterality: Right;  . UMBILICAL HERNIA REPAIR  ~ 1963   "that's why I don't have a belly button"  . VAGINAL HYSTERECTOMY       Medications:  No current facility-administered medications for this encounter.  Current Outpatient Medications:  .  albuterol (VENTOLIN HFA) 108 (90 Base) MCG/ACT inhaler, INHALE 2 PUFFS INTO THE LUNGS EVERY 6 HOURS AS NEEDED FOR WHEEZING OR SHORTNESS OF BREATH, Disp: 8.5 g, Rfl: 2 .  albuterol (VENTOLIN HFA) 108 (90 Base) MCG/ACT inhaler, Inhale 2 puffs into the lungs every 4 (four)- 6 (six) hours., Disp: 6.7 g, Rfl: 1 .  alteplase (CATHFLO ACTIVASE) 2 MG injection, 2 mg by Intracatheter route once as needed for open catheter., Disp: 1 each, Rfl: 3 .  amLODipine (NORVASC) 10 MG tablet, Take 1 tablet (10 mg total) by mouth daily., Disp: 30 tablet, Rfl: 5 .  atorvastatin (LIPITOR) 20 MG tablet, Take 1 tablet (20 mg total) by mouth daily., Disp: 30 tablet, Rfl: 5 .  azithromycin (ZITHROMAX) 250 MG tablet, Take 2  tablets by mouth  on day one then 1 tablet once a day for four days., Disp: 6 tablet, Rfl: 0 .  calcitRIOL (ROCALTROL) 0.5 MCG capsule, Take 1 capsule (0.5 mcg total) by mouth daily., Disp: 30 capsule, Rfl: 5 .  carvedilol (COREG) 12.5 MG tablet, Take 1 tablet (12.5  mg total) by mouth 2 (two) times daily., Disp: 60 tablet, Rfl: 5 .  carvedilol (COREG) 3.125 MG tablet, Take 1 tablet (3.125 mg total) by mouth 2 (two) times daily., Disp: 60 tablet, Rfl: 0 .  cyclobenzaprine (FLEXERIL) 10 MG tablet, Take 1 tablet (10 mg total) by mouth 3 (three) times daily., Disp: 90 tablet, Rfl: 5 .  cyclobenzaprine (FLEXERIL) 10 MG  tablet, take 1 tablet by mouth three times daily as needed, Disp: 90 tablet, Rfl: 2 .  dicyclomine (BENTYL) 10 MG capsule, Take 1 capsule (10 mg total) by mouth 3 (three) times daily before meals., Disp: 90 capsule, Rfl: 5 .  DULoxetine (CYMBALTA) 30 MG capsule, Take 1 capsule (30 mg total) by mouth daily., Disp: 30 capsule, Rfl: 5 .  epoetin alfa (EPOGEN) 10000 UNIT/ML injection, Inject 1 mL (10,000 Units total) into the vein every Monday, Wednesday, and Friday with hemodialysis., Disp: 1 mL, Rfl: 3 .  escitalopram (LEXAPRO) 10 MG tablet, Take 10 mg by mouth daily., Disp: , Rfl:  .  escitalopram (LEXAPRO) 20 MG tablet, Take 1 tablet (20 mg total) by mouth daily., Disp: 30 tablet, Rfl: 5 .  ezetimibe (ZETIA) 10 MG tablet, Take 1 tablet (10 mg total) by mouth daily., Disp: 30 tablet, Rfl: 5 .  ferrous sulfate 325 (65 FE) MG tablet, Take 1 tablet (325 mg total) by mouth every other day., Disp: 30 tablet, Rfl: 5 .  Fluticasone-Umeclidin-Vilant (TRELEGY ELLIPTA) 100-62.5-25 MCG/ACT AEPB, INHALE 1 PUFF INTO THE LUNGS DAILY, Disp: 60 each, Rfl: 0 .  folic acid (FOLVITE) 1 MG tablet, Take 1 tablet (1 mg total) by mouth daily., Disp: 30 tablet, Rfl: 5 .  HYDROcodone-acetaminophen (NORCO) 5-325 MG tablet, Take 1 tablet by mouth every 6 (six) hours as needed for moderate pain (pain score 4-6)., Disp: 10 tablet, Rfl: 0 .  ibuprofen (ADVIL) 200 MG tablet, Take 400 mg by mouth 3 (three) times daily., Disp: , Rfl:  .  lamoTRIgine (LAMICTAL) 100 MG tablet, Take 1 tablet (100 mg total) by mouth daily., Disp: 30 tablet, Rfl: 5 .  lidocaine (LIDODERM) 5 %, Place 1 patch  onto the skin daily. Remove & Discard patch within 12 hours or as directed by MD, Disp: 3 patch, Rfl: 0 .  loperamide (IMODIUM) 2 MG capsule, Take 1 capsule (2 mg total) by mouth 2 (two) times daily., Disp: 30 capsule, Rfl: 5 .  melatonin 5 MG TABS, Take 1 tablet (5 mg total) by mouth at bedtime., Disp: 30 tablet, Rfl: 0 .  metolazone (ZAROXOLYN) 2.5 MG tablet, Take 2.5 mg by mouth every Monday, Wednesday, and Friday., Disp: , Rfl:  .  mirtazapine (REMERON) 7.5 MG tablet, Take 7.5 mg by mouth 2 (two) times daily. States takes 2 tablets po at bedtime., Disp: , Rfl:  .  omeprazole (PRILOSEC) 40 MG capsule, Take 1 capsule (40 mg total) by mouth every morning., Disp: 30 capsule, Rfl: 5 .  potassium chloride SA (KLOR-CON M) 20 MEQ tablet, Take 20 mEq by mouth See admin instructions. Take 1 tablet by mouth for 3 days, Disp: , Rfl:  .  prazosin (MINIPRESS) 2 MG capsule, Take 1 capsule (2 mg total) by mouth at bedtime., Disp: 30 capsule, Rfl: 5 .  promethazine (PHENERGAN) 6.25 MG/5ML solution, Take 5 ml by mouth every 4-6 hours as needed for cough and nausea, Disp: 300 mL, Rfl: 1 .  QUEtiapine (SEROQUEL) 100 MG tablet, Take 1 tablet (100 mg total) by mouth in the morning AND 2 tablets (200 mg total) every evening., Disp: 90 tablet, Rfl: 2 .  torsemide (DEMADEX) 20 MG tablet, Take 1 tablet (20 mg total) by mouth 2 (two) times daily. (Patient taking differently: Take 20 mg by mouth 2 (two) times daily. Pt states she is taking 3 times a day), Disp: 60 tablet, Rfl: 2 .  torsemide (DEMADEX) 20 MG tablet, Take 1 tablet (20 mg  total) by mouth 2 (two) times daily., Disp: 60 tablet, Rfl: 2  Allergies: Allergies  Allergen Reactions  . Asa [Aspirin] Other (See Comments)    GI bleeds  . Hydrocodone Shortness Of Breath and Hives  . Latuda [Lurasidone Hcl] Anaphylaxis  . Lurasidone Anaphylaxis  . Magnesium-Containing Compounds Anaphylaxis and Other (See Comments)    Tolerated Ensure  . Prednisone Anaphylaxis,  Swelling and Other (See Comments)    Swelling of tongue, lips, throat  . Ultram [Tramadol] Anaphylaxis, Swelling and Rash  . Codeine Nausea And Vomiting and Rash  . Desyrel [Trazodone] Other (See Comments)    Paranoia   . Hydralazine Other (See Comments)    Muscle spasms  . Peanut (Diagnostic) Hives and Other (See Comments)    Raw peanuts only  OK to eat peanut butter.  . Peanut-Containing Drug Products Hives    Raw peanuts, but can eat peanut butter  . Topamax [Topiramate] Other (See Comments)    Paranoia  . Sulfa Antibiotics Itching  . Tape Rash    Lenox Ponds, NP

## 2023-03-10 NOTE — ED Notes (Signed)
 Multiples attempts made by 2 individuals to place IV unsuccessfully.

## 2023-03-11 ENCOUNTER — Inpatient Hospital Stay: Admit: 2023-03-11 | Payer: PPO | Admitting: Psychiatry

## 2023-03-11 LAB — RESP PANEL BY RT-PCR (RSV, FLU A&B, COVID)  RVPGX2
Influenza A by PCR: NEGATIVE
Influenza B by PCR: NEGATIVE
Resp Syncytial Virus by PCR: NEGATIVE
SARS Coronavirus 2 by RT PCR: NEGATIVE

## 2023-03-11 MED ORDER — AMLODIPINE BESYLATE 5 MG PO TABS
10.0000 mg | ORAL_TABLET | Freq: Every day | ORAL | Status: DC
  Administered 2023-03-11 – 2023-03-14 (×4): 10 mg via ORAL
  Filled 2023-03-11 (×4): qty 2

## 2023-03-11 MED ORDER — CYCLOBENZAPRINE HCL 10 MG PO TABS
10.0000 mg | ORAL_TABLET | Freq: Three times a day (TID) | ORAL | Status: DC | PRN
  Administered 2023-03-12 – 2023-03-14 (×4): 10 mg via ORAL
  Filled 2023-03-11 (×4): qty 1

## 2023-03-11 MED ORDER — EZETIMIBE 10 MG PO TABS
10.0000 mg | ORAL_TABLET | Freq: Every day | ORAL | Status: DC
Start: 2023-03-11 — End: 2023-03-14
  Administered 2023-03-11 – 2023-03-14 (×4): 10 mg via ORAL
  Filled 2023-03-11 (×7): qty 1

## 2023-03-11 MED ORDER — CALCITRIOL 0.5 MCG PO CAPS
0.5000 ug | ORAL_CAPSULE | Freq: Every day | ORAL | Status: DC
  Administered 2023-03-11 – 2023-03-14 (×4): 0.5 ug via ORAL
  Filled 2023-03-11 (×8): qty 1

## 2023-03-11 MED ORDER — TORSEMIDE 20 MG PO TABS
20.0000 mg | ORAL_TABLET | Freq: Two times a day (BID) | ORAL | Status: DC
  Administered 2023-03-11 – 2023-03-14 (×7): 20 mg via ORAL
  Filled 2023-03-11 (×7): qty 1

## 2023-03-11 MED ORDER — HYDROXYZINE HCL 10 MG PO TABS
10.0000 mg | ORAL_TABLET | Freq: Three times a day (TID) | ORAL | Status: DC | PRN
  Administered 2023-03-11 – 2023-03-14 (×6): 10 mg via ORAL
  Filled 2023-03-11 (×6): qty 1

## 2023-03-11 MED ORDER — FOLIC ACID 1 MG PO TABS
1.0000 mg | ORAL_TABLET | Freq: Every day | ORAL | Status: DC
  Administered 2023-03-11 – 2023-03-14 (×4): 1 mg via ORAL
  Filled 2023-03-11 (×4): qty 1

## 2023-03-11 MED ORDER — ATORVASTATIN CALCIUM 10 MG PO TABS
20.0000 mg | ORAL_TABLET | Freq: Every day | ORAL | Status: DC
  Administered 2023-03-11 – 2023-03-14 (×4): 20 mg via ORAL
  Filled 2023-03-11 (×5): qty 2

## 2023-03-11 MED ORDER — DICYCLOMINE HCL 10 MG PO CAPS
10.0000 mg | ORAL_CAPSULE | Freq: Three times a day (TID) | ORAL | Status: DC
  Administered 2023-03-11 – 2023-03-14 (×7): 10 mg via ORAL
  Filled 2023-03-11 (×7): qty 1

## 2023-03-11 MED ORDER — PANTOPRAZOLE SODIUM 40 MG PO TBEC
40.0000 mg | DELAYED_RELEASE_TABLET | Freq: Every day | ORAL | Status: DC
  Administered 2023-03-11 – 2023-03-14 (×4): 40 mg via ORAL
  Filled 2023-03-11 (×4): qty 1

## 2023-03-11 MED ORDER — LAMOTRIGINE 100 MG PO TABS
100.0000 mg | ORAL_TABLET | Freq: Every day | ORAL | Status: DC
  Administered 2023-03-11 – 2023-03-14 (×4): 100 mg via ORAL
  Filled 2023-03-11 (×2): qty 1
  Filled 2023-03-11: qty 4
  Filled 2023-03-11: qty 1

## 2023-03-11 MED ORDER — LOPERAMIDE HCL 2 MG PO CAPS
2.0000 mg | ORAL_CAPSULE | Freq: Once | ORAL | Status: AC
  Administered 2023-03-11: 2 mg via ORAL
  Filled 2023-03-11: qty 1

## 2023-03-11 MED ORDER — ALBUTEROL SULFATE HFA 108 (90 BASE) MCG/ACT IN AERS: 2.0000 | INHALATION_SPRAY | Freq: Four times a day (QID) | RESPIRATORY_TRACT | Status: DC | PRN

## 2023-03-11 MED ORDER — CARVEDILOL 12.5 MG PO TABS
12.5000 mg | ORAL_TABLET | Freq: Two times a day (BID) | ORAL | Status: DC
Start: 2023-03-11 — End: 2023-03-14
  Administered 2023-03-11 – 2023-03-14 (×7): 12.5 mg via ORAL
  Filled 2023-03-11 (×7): qty 1

## 2023-03-11 MED ORDER — ALBUTEROL SULFATE (2.5 MG/3ML) 0.083% IN NEBU
2.5000 mg | INHALATION_SOLUTION | Freq: Four times a day (QID) | RESPIRATORY_TRACT | Status: DC | PRN
  Administered 2023-03-11 – 2023-03-13 (×2): 2.5 mg via RESPIRATORY_TRACT
  Filled 2023-03-11 (×2): qty 3

## 2023-03-11 MED ORDER — CHLORHEXIDINE GLUCONATE CLOTH 2 % EX PADS
6.0000 | MEDICATED_PAD | Freq: Every day | CUTANEOUS | Status: DC
  Administered 2023-03-14: 6 via TOPICAL

## 2023-03-11 MED ORDER — FERROUS SULFATE 325 (65 FE) MG PO TABS
325.0000 mg | ORAL_TABLET | Freq: Every day | ORAL | Status: DC
  Administered 2023-03-11 – 2023-03-14 (×4): 325 mg via ORAL
  Filled 2023-03-11 (×4): qty 1

## 2023-03-11 NOTE — ED Notes (Signed)
 Pt given bath and linen change, required minimal assist after setup. Pt wearing disposable brief d/t urinary incontinence

## 2023-03-11 NOTE — Progress Notes (Signed)
 Pt was accepted to [facility] 03/11/2023  Bed Assignment PENDING   Address: [address] [place], [place], [place] [address]  [facility] Fax: [phone]  Pt meets inpatient criteria per: Arsenio Loader NP  Attending Physician will be Dr. Callie Fielding  Report can be called to: [phone]  Pt can arrive after home meds restarted. we can tentatively accept this pt to [facility] unit pending negative PCR Covid. IVC paperwork uploaded into the record is incomplete. I will need Findings and Custody front and back page uploaded into record with part A AND B signed showing pt has been served.   Care Team notified:Linsey Strader RN, Arsenio Loader NP, Fulton Reek MD, Rozell Searing RN, Jeoffrey Massed RN,     [name], MSW, [facility] 03/11/2023 1:25 PM

## 2023-03-11 NOTE — Progress Notes (Signed)
 Asked to see for dialysis orders for tomorrow 03/12/23. Pt will be admitted to [facility] and the ED team is requesting HD orders be written from here at [facility] and they will passed over to the [facility] orders once she is transferred.  Pt is under IVC due to psychosis and other mental health issues.   Pt seen in ED room. Exam is neg for vol overload. K+ 3.3 and creat 3.1 from yesterday. Pt gets HD MWF at [facility], last HD was Friday, post wt 83.9.  Dry wt 84.0.    [facility] HD: [facility] 4h   500/ 1.5    84kg  3K bath   RIJ TDC  Heparin none - mircera 30 mcg q 4 wks, last 1/27, due 2/24 (tomorrow) - rocaltrol 0.25 mcg po three times per week  2/21 - K 3.3  CO2 20  BUN 14  creat 3.15   WBC 5K Hb 10.9   Orders written for HD tomorrow 2/24.   Vinson Moselle  MD  [facility] 03/11/2023, 1:47 PM  Recent Labs  Lab 03/10/23 0450  HGB 10.9*  ALBUMIN 2.9*  CALCIUM 9.1  CREATININE 3.15*  K 3.3*    Inpatient medications:  amLODipine  10 mg Oral Daily   atorvastatin  20 mg Oral Daily   calcitRIOL  0.5 mcg Oral Daily   carvedilol  12.5 mg Oral BID   dicyclomine  10 mg Oral TID AC   DULoxetine  30 mg Oral Daily   escitalopram  10 mg Oral Daily   ezetimibe  10 mg Oral Daily   ferrous sulfate  325 mg Oral Daily   folic acid  1 mg Oral Daily   lamoTRIgine  100 mg Oral Daily   melatonin  5 mg Oral QHS   mirtazapine  7.5 mg Oral QHS   pantoprazole  40 mg Oral Daily   QUEtiapine  50 mg Oral BID   torsemide  20 mg Oral BID    albuterol, cyclobenzaprine

## 2023-03-11 NOTE — ED Provider Notes (Signed)
 Emergency Medicine Observation Re-evaluation Note  Tiffany [name] is a 63 y.o. female, seen on rounds today.  Pt initially presented to the ED for complaints of Psychiatric Evaluation, Homicidal, and Suicidal Currently, the patient is awaiting psychiatric admission.  She has no acute concerns.  Physical Exam  BP (!) 144/94 (BP Location: Right Arm)   Pulse 93   Temp 98.3 F (36.8 C) (Oral)   Resp 18   Ht 5\' 3"  (1.6 m)   Wt 89.8 kg   SpO2 98%   BMI 35.07 kg/m  Physical Exam General: Resting comfortably Lungs: Clear lungs, normal work of breathing Psych: Calm, cooperative  ED Course / MDM  EKG:   I have reviewed the labs performed to date as well as medications administered while in observation.  Recent changes in the last 24 hours include all meds ordered.  Plan  Current plan is for psychiatric admission.    Laurence Spates, MD 03/11/23 2124

## 2023-03-11 NOTE — Group Note (Signed)
 Date:  03/11/2023 Time:  10:28 PM  Group Topic/Focus:  Goals Group:   The focus of this group is to help patients establish daily goals to achieve during treatment and discuss how the patient can incorporate goal setting into their daily lives to aide in recovery.    Participation Level:  Did Not Attend  Participation Quality:   Did Not Attend  Affect:   Did Not Attend  Cognitive:   Did Not Attend  Insight: None  Engagement in Group:  None  Modes of Intervention:   Did Not Attend  Additional Comments:  Pt not yet on unit  [place] 03/11/2023, 10:28 PM

## 2023-03-11 NOTE — ED Provider Notes (Signed)
 Per documentation patient appears to be medically clear for psychiatric management.  Patient will need to maintain her dialysis regimen Monday/Wednesday/Friday.  Nephrology was made aware about her dialysis needs.   Lucile Hillmann, Canary Brim, MD 03/11/23 1101

## 2023-03-11 NOTE — ED Notes (Signed)
 [facility] transport contacted again, VM left

## 2023-03-11 NOTE — ED Notes (Signed)
 Pt sitting on the side of the bed stating she has to pee but does not want to get back on [facility] Staff attempted to help patient get back in bed but she states she will just urinate in the bed; Pt begin getting an attitude with staff and states she will just pee in the bed; RN removed herself from conversation due to the hostility in patient's voice; Pt continues to sit on the side of the bed with no brief on-Monique,RN

## 2023-03-12 ENCOUNTER — Other Ambulatory Visit: Payer: Self-pay

## 2023-03-12 LAB — RENAL FUNCTION PANEL
Albumin: 3.1 g/dL — ABNORMAL LOW (ref 3.5–5.0)
Anion gap: 12 (ref 5–15)
BUN: 38 mg/dL — ABNORMAL HIGH (ref 8–23)
CO2: 22 mmol/L (ref 22–32)
Calcium: 9.4 mg/dL (ref 8.9–10.3)
Chloride: 92 mmol/L — ABNORMAL LOW (ref 98–111)
Creatinine, Ser: 5.07 mg/dL — ABNORMAL HIGH (ref 0.44–1.00)
GFR, Estimated: 9 mL/min — ABNORMAL LOW
Glucose, Bld: 124 mg/dL — ABNORMAL HIGH (ref 70–99)
Phosphorus: 3.1 mg/dL (ref 2.5–4.6)
Potassium: 2.9 mmol/L — ABNORMAL LOW (ref 3.5–5.1)
Sodium: 126 mmol/L — ABNORMAL LOW (ref 135–145)

## 2023-03-12 LAB — CBC
HCT: 29.1 % — ABNORMAL LOW (ref 36.0–46.0)
Hemoglobin: 9.7 g/dL — ABNORMAL LOW (ref 12.0–15.0)
MCH: 30.2 pg (ref 26.0–34.0)
MCHC: 33.3 g/dL (ref 30.0–36.0)
MCV: 90.7 fL (ref 80.0–100.0)
Platelets: 280 10*3/uL (ref 150–400)
RBC: 3.21 MIL/uL — ABNORMAL LOW (ref 3.87–5.11)
RDW: 15.6 % — ABNORMAL HIGH (ref 11.5–15.5)
WBC: 5.4 10*3/uL (ref 4.0–10.5)
nRBC: 0 % (ref 0.0–0.2)

## 2023-03-12 MED ORDER — LORAZEPAM 1 MG PO TABS
1.0000 mg | ORAL_TABLET | Freq: Once | ORAL | Status: AC
  Administered 2023-03-12: 1 mg via ORAL
  Filled 2023-03-12: qty 1

## 2023-03-12 NOTE — ED Notes (Signed)
 IVC is current

## 2023-03-12 NOTE — Group Note (Signed)
 Recreation Therapy Group Note   Group Topic:General Recreation  Group Date: 03/12/2023 Start Time:  2:00 PM End Time:  3:00 PM Facilitators: Rosina Lowenstein, LRT, CTRS Location: Courtyard  Group Description: Outdoor Recreation. Patients had the option to play corn hole, ring toss, bowling or listening to music while outside in the courtyard getting fresh air and sunlight. LRT and patients discussed things that they enjoy doing in their free time outside of the hospital. LRT encouraged patients to drink water after being active and getting their heart rate up.   Goal Area(s) Addressed: Patient will identify leisure interests.  Patient will practice healthy decision making. Patient will engage in recreation activity.   Affect/Mood: N/A   Participation Level: Did not attend    Clinical Observations/Individualized Feedback: Patient did not attend group due to not being on the unit yet.   Plan: Continue to engage patient in RT group sessions 2-3x/week.   Rosina Lowenstein, LRT, CTRS 03/12/2023 4:48 PM

## 2023-03-12 NOTE — BHH Counselor (Addendum)
 Social Work Disposition Note:  Patient was recommended inpatient per Ophelia Shoulder NP. There are no available beds at [facility], per [facility] Rona Ravens RN. Patient was re-faxed to the following out of network facilities during this pm shift:   Building control surveyor Address Phone Fax Patient Preferred  [facility] Pending - Request Sent -- [address], [place] [address] [phone] --  [facility] Pending - Request Sent -- [address] [place] [address] [phone] --  [facility] Pending - Request Sent -- [address] [place] [address] [phone] --  [facility] Pending - Request Sent -- [address] [place] [address] [phone] --  [facility] Pending - Request Sent -- [address] [place] [address] [phone] --  [facility] Pending - Request Sent -- [address] [place] [address] [phone] --  [facility] Pending - Request Sent -- [address], [place] [address] [phone] --  [facility] Pending - Request Sent -- [address] [place] [address] [phone] --  [facility] Pending - Request Sent -- [address] [place] [address] [phone] --  [facility] Pending - Request Sent -- [address] [place] [phone] --  [facility] Pending - Request Sent -- [address] [place] [address] [phone] --  [facility] Pending - Request Sent -- [address] [place], [place] [address] [phone] --  [facility]  Pending - Request Sent -- [place] [phone] --  [facility] Pending - Request Sent -- [address] [place] [address] [phone] --  [facility] Pending - Request Sent -- [address] [place] [address] [phone] --  [facility] Pending - Request Sent -- [address] [place] [address] [phone] --  [facility] Pending - Request Sent -- [address], [place] [address] [phone] --  [facility] Pending - Request Sent -- [address] [place]

## 2023-03-12 NOTE — Progress Notes (Signed)
 LCSW Progress Note  [id number]   VANNIA POLA  03/12/2023  2:49 PM  Description:   Inpatient Psychiatric Referral  Patient was recommended inpatient per Ophelia Shoulder NP. There are no available beds at [facility], per [facility] Rona Ravens RN. Patient was referred to the following out of network facilities:   Destination  Service Provider Address Phone Fax  [facility] [address], [place] [address] [phone]  [facility] [address] [place], [place] [address] [phone]  [facility] [address] [place], [place] [address] [phone]  [facility] [address] [place] [address] [phone]  [facility] [address] [place] [address] [phone]  [facility] [address] [place] [address] [phone]  [facility] [address], [place] [address] [phone]  [facility] [address] [place] [address] [phone]  [facility] [address] [place] [address] [phone]      Situation ongoing, [name] to continue following and update chart as more information becomes available.      Cathie Beams, MSW, LCSW  03/12/2023 2:49 PM

## 2023-03-12 NOTE — Group Note (Signed)
 Date:  03/12/2023 Time:  9:39 PM  Group Topic/Focus:  Wrap-Up Group:   The focus of this group is to help patients review their daily goal of treatment and discuss progress on daily workbooks.    Participation Level:  Did Not Attend  Participation Quality:      Affect:      Cognitive:      Insight: None  Engagement in Group:      Modes of Intervention:      Additional Comments:    Maeola Harman 03/12/2023, 9:39 PM

## 2023-03-12 NOTE — Progress Notes (Signed)
   03/12/23 1210  Vitals  Temp 98.5 F (36.9 C)  Pulse Rate 84  Resp (!) 21  BP 119/86  SpO2 100 %  O2 Device Room Air  Post Treatment  Dialyzer Clearance Lightly streaked  Hemodialysis Intake (mL) 0 mL  Liters Processed 72  Fluid Removed (mL) 3000 mL  Tolerated HD Treatment Yes   Received patient in bed to unit.  Alert and oriented.  Informed consent signed and in chart.   TX duration:3hrs  Patient tolerated well.  Transported back to the room  Alert, without acute distress.  Hand-off given to patient's nurse.   Access used: [facility] Access issues: none  Total UF removed: 3L Medication(s) given: none   [name]

## 2023-03-13 MED ORDER — POTASSIUM CHLORIDE CRYS ER 20 MEQ PO TBCR
40.0000 meq | EXTENDED_RELEASE_TABLET | Freq: Once | ORAL | Status: AC
  Administered 2023-03-13: 40 meq via ORAL
  Filled 2023-03-13: qty 2

## 2023-03-13 MED ORDER — CHLORHEXIDINE GLUCONATE CLOTH 2 % EX PADS
6.0000 | MEDICATED_PAD | Freq: Every day | CUTANEOUS | Status: DC
  Administered 2023-03-14: 6 via TOPICAL

## 2023-03-13 NOTE — ED Provider Notes (Signed)
 Emergency Medicine Observation Re-evaluation Note  CYTHIA BACHTEL is a 63 y.o. female, seen on rounds today.  Pt initially presented to the ED for complaints of Psychiatric Evaluation, Homicidal, and Suicidal Currently, the patient is eating lunch.. no complaints. Awaiting placement.  Physical Exam  BP 122/71 (BP Location: Right Arm)   Pulse 69   Temp 99 F (37.2 C) (Oral)   Resp 18   Ht 5\' 3"  (1.6 m)   Wt 89.8 kg   SpO2 100%   BMI 35.07 kg/m  Physical Exam General: well, no disress Cardiac: regular Lungs: CTA Psych: calm  ED Course / MDM  EKG:EKG Interpretation Date/Time:  Saturday March 10 2023 09:21:46 EST Ventricular Rate:  86 PR Interval:  164 QRS Duration:  84 QT Interval:  392 QTC Calculation: 469 R Axis:   -15  Text Interpretation: Normal sinus rhythm Cannot rule out Anterior infarct , age undetermined Abnormal ECG When compared with ECG of 18-Feb-2023 10:35, PREVIOUS ECG IS PRESENT Confirmed by Anders Simmonds [phone]) on 03/12/2023 10:14:53 AM  I have reviewed the labs performed to date as well as medications administered while in observation.  Recent changes in the last 24 hours include none.  Plan  Current plan is for placement.    Arby Barrette, MD 03/13/23 [phone]

## 2023-03-13 NOTE — ED Notes (Signed)
 Pt appears to be sleeping, equal bilateral rise & fall of chest observed with not signs of distress. Will defer PO medications until patient wakes.

## 2023-03-13 NOTE — Progress Notes (Signed)
 Pt receives out-pt HD at [facility] on MWF. Will assist as needed.   Olivia Canter Renal Navigator [phone]

## 2023-03-13 NOTE — Group Note (Signed)
 Recreation Therapy Group Note   Group Topic:Communication  Group Date: 03/13/2023 Start Time:  1:30 PM End Time:  2:30 PM Facilitators: Rosina Lowenstein, LRT, CTRS Location: Courtyard  Group Description: Emotional Check in. Patient sat and talked with LRT about how they are doing and whatever else is on their mind. LRT provided active listening, reassurance and encouragement. Pts were given the opportunity to listen to music or color mandalas while they talk.    Goal Area(s) Addressed: Patient will engage in conversation with LRT. Patient will communicate their wants, needs, or questions.  Patient will practice a new coping skill of "talking to someone".   Affect/Mood: N/A   Participation Level: Did not attend    Clinical Observations/Individualized Feedback: Patient did not attend group.   Plan: Continue to engage patient in RT group sessions 2-3x/week.   Rosina Lowenstein, LRT, CTRS 03/13/2023 4:31 PM

## 2023-03-13 NOTE — ED Notes (Signed)
 Just assumed care of patient. Patient laying in bed with 1:1 sitter at bedside. Patient is asleep and resting comfortably with light turn off and patient given a warm blanket for comfort. Patient is in no noted distress at the present time will continue to monitor for any changes.

## 2023-03-13 NOTE — Group Note (Signed)
 Date:  03/13/2023 Time:  9:39 PM  Group Topic/Focus:  Wrap-Up Group:   The focus of this group is to help patients review their daily goal of treatment and discuss progress on daily workbooks.    Participation Level:  Did Not Attend  Participation Quality:      Affect:      Cognitive:      Insight: None  Engagement in Group:      Modes of Intervention:      Additional Comments:    Maeola Harman 03/13/2023, 9:39 PM

## 2023-03-14 ENCOUNTER — Encounter (HOSPITAL_COMMUNITY): Payer: Self-pay | Admitting: Psychiatry

## 2023-03-14 DIAGNOSIS — F333 Major depressive disorder, recurrent, severe with psychotic symptoms: Secondary | ICD-10-CM

## 2023-03-14 LAB — CBC
HCT: 30.5 % — ABNORMAL LOW (ref 36.0–46.0)
Hemoglobin: 10 g/dL — ABNORMAL LOW (ref 12.0–15.0)
MCH: 29.6 pg (ref 26.0–34.0)
MCHC: 32.8 g/dL (ref 30.0–36.0)
MCV: 90.2 fL (ref 80.0–100.0)
Platelets: 282 10*3/uL (ref 150–400)
RBC: 3.38 MIL/uL — ABNORMAL LOW (ref 3.87–5.11)
RDW: 15.3 % (ref 11.5–15.5)
WBC: 5.8 10*3/uL (ref 4.0–10.5)
nRBC: 0 % (ref 0.0–0.2)

## 2023-03-14 LAB — RENAL FUNCTION PANEL
Albumin: 3.2 g/dL — ABNORMAL LOW (ref 3.5–5.0)
Anion gap: 11 (ref 5–15)
BUN: 45 mg/dL — ABNORMAL HIGH (ref 8–23)
CO2: 25 mmol/L (ref 22–32)
Calcium: 9.8 mg/dL (ref 8.9–10.3)
Chloride: 90 mmol/L — ABNORMAL LOW (ref 98–111)
Creatinine, Ser: 4.58 mg/dL — ABNORMAL HIGH (ref 0.44–1.00)
GFR, Estimated: 10 mL/min — ABNORMAL LOW (ref >60)
Glucose, Bld: 108 mg/dL — ABNORMAL HIGH (ref 70–99)
Phosphorus: 3.4 mg/dL (ref 2.5–4.6)
Potassium: 3.5 mmol/L (ref 3.5–5.1)
Sodium: 126 mmol/L — ABNORMAL LOW (ref 135–145)

## 2023-03-14 LAB — MAGNESIUM: Magnesium: 1.9 mg/dL (ref 1.7–2.4)

## 2023-03-14 MED ORDER — MIRTAZAPINE 7.5 MG PO TABS: 7.5000 mg | ORAL_TABLET | Freq: Every day | ORAL | 0 refills | Status: DC

## 2023-03-14 MED ORDER — LIDOCAINE HCL (PF) 1 % IJ SOLN: 5.0000 mL | INTRAMUSCULAR | Status: DC | PRN

## 2023-03-14 MED ORDER — HEPARIN SODIUM (PORCINE) 1000 UNIT/ML DIALYSIS: 1000.0000 [IU] | INTRAMUSCULAR | Status: DC | PRN

## 2023-03-14 MED ORDER — ANTICOAGULANT SODIUM CITRATE 4% (200MG/5ML) IV SOLN: 5.0000 mL | Status: DC | PRN

## 2023-03-14 MED ORDER — HYDROXYZINE HCL 10 MG PO TABS: 10.0000 mg | ORAL_TABLET | Freq: Three times a day (TID) | ORAL | 0 refills | Status: DC | PRN

## 2023-03-14 MED ORDER — ONDANSETRON 4 MG PO TBDP
4.0000 mg | ORAL_TABLET | Freq: Three times a day (TID) | ORAL | Status: DC | PRN
  Administered 2023-03-14: 4 mg via ORAL
  Filled 2023-03-14: qty 1

## 2023-03-14 MED ORDER — QUETIAPINE FUMARATE 50 MG PO TABS: 50.0000 mg | ORAL_TABLET | Freq: Two times a day (BID) | ORAL | 0 refills | Status: DC

## 2023-03-14 MED ORDER — ALTEPLASE 2 MG IJ SOLR: 2.0000 mg | Freq: Once | INTRAMUSCULAR | Status: DC | PRN

## 2023-03-14 MED ORDER — ESCITALOPRAM OXALATE 10 MG PO TABS: 10.0000 mg | ORAL_TABLET | Freq: Every day | ORAL | 0 refills | Status: DC

## 2023-03-14 MED ORDER — DULOXETINE HCL 30 MG PO CPEP: 30.0000 mg | ORAL_CAPSULE | Freq: Every day | ORAL | 0 refills | Status: DC

## 2023-03-14 MED ORDER — LIDOCAINE-PRILOCAINE 2.5-2.5 % EX CREA: 1.0000 | TOPICAL_CREAM | CUTANEOUS | Status: DC | PRN

## 2023-03-14 MED ORDER — PENTAFLUOROPROP-TETRAFLUOROETH EX AERO: 1.0000 | INHALATION_SPRAY | CUTANEOUS | Status: DC | PRN

## 2023-03-14 NOTE — Progress Notes (Signed)
 Pt d/c noted. Contacted [facility] to be advised of pt's d/c today and that pt should resume care on Friday.   Olivia Canter Renal Navigator [phone]

## 2023-03-14 NOTE — Discharge Instructions (Addendum)
 Call your dialysis center today to see if they can get you in for tomorrow.  If not, you are okay to go to your dialysis on Friday.   Please continue current psychiatric medication regimen listed below --> Seroquel 50 mg p.o. twice daily, morning and evening --> Mirtazapine 7.5 mg p.o. nightly --> Melatonin 5 mg p.o. nightly --> Lexapro 10 mg p.o. daily --> Duloxetine 30 mg p.o. daily --> Atarax 10 mg p.o. 3 times daily as needed Please perform close outpatient follow-up with mental health resources Please strictly adhere to safety plan listed below   Safety Plan Tiffany [name] will reach out to Son, call 911 or call mobile crisis, or go to nearest emergency room if condition worsens or if suicidal thoughts become active Patients' will follow up with [facility] for outpatient psychiatric services (therapy/medication management).  The suicide prevention education provided includes the following: Suicide risk factors Suicide prevention and interventions National Suicide Hotline telephone number [facility] assessment telephone number [facility] Emergency Assistance 911 [facility] and/or Residential Mobile Crisis Unit telephone number

## 2023-03-14 NOTE — ED Notes (Signed)
 IVC'd 03/10/23, exp 03/17/23: IVC docs [place]

## 2023-03-14 NOTE — Group Note (Signed)
 Date:  03/14/2023 Time:  8:59 PM  Group Topic/Focus:  Wrap-Up Group:   The focus of this group is to help patients review their daily goal of treatment and discuss progress on daily workbooks.    Participation Level:  Did Not Attend  Lenore Cordia 03/14/2023, 8:59 PM

## 2023-03-14 NOTE — ED Provider Notes (Addendum)
 Emergency Medicine Observation Re-evaluation Note  Tiffany [name] is a 63 y.o. female, seen on rounds today.  Pt initially presented to the ED for complaints of Psychiatric Evaluation, Homicidal, and Suicidal Currently, the patient is in room without complaint.  Physical Exam  BP 123/68   Pulse 78   Temp 97.9 F (36.6 C)   Resp 16   Ht 5\' 3"  (1.6 m)   Wt 89.8 kg   SpO2 97%   BMI 35.07 kg/m  Physical Exam Vitals reviewed.  Constitutional:      Appearance: Normal appearance.  Cardiovascular:     Rate and Rhythm: Normal rate.  Pulmonary:     Effort: Pulmonary effort is normal.  Abdominal:     General: There is no distension.  Neurological:     General: No focal deficit present.     Mental Status: [name] alert.  Psychiatric:        Mood and Affect: Mood normal.     ED Course / MDM  EKG:EKG Interpretation Date/Time:  Saturday March 10 2023 09:21:46 EST Ventricular Rate:  86 PR Interval:  164 QRS Duration:  84 QT Interval:  392 QTC Calculation: 469 R Axis:   -15  Text Interpretation: Normal sinus rhythm Cannot rule out Anterior infarct , age undetermined Abnormal ECG When compared with ECG of 18-Feb-2023 10:35, PREVIOUS ECG IS PRESENT Confirmed by Anders Simmonds [phone]) on 03/12/2023 10:14:53 AM  I have reviewed the labs performed to date as well as medications administered while in observation.    Patient has been seen and evaluated by our psychiatry team.  They recommend discharge with continuation of her outpatient medications.  Patient is a dialysis patient was post to be Monday Wednesday Friday.  Evaluated the patient, she is not fluid overloaded, had labs drawn this morning which show a potassium of 3.5.  [name] appropriate for follow-up of dialysis on Friday.  Will discharge patient. Plan  Current plan is for placement.    Anders Simmonds T, DO 03/14/23 0813    Anders Simmonds T, DO 03/14/23 1155
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
## 2023-03-14 NOTE — Consult Note (Cosign Needed Addendum)
 [facility] Psychiatric Consult Follow-up  Patient Name: .Tiffany [name]  MRN: [id number]  DOB: [birth year]  Consult Order details:  Orders (From admission, onward)     Start     Ordered   03/10/23 0720  CONSULT TO CALL ACT TEAM       Ordering Provider: Gilda Crease, MD  Provider:  (Not yet assigned)  Question:  Reason for Consult?  Answer:  Psych consult   03/10/23 0720             Mode of Visit: In person    Psychiatry Consult Evaluation  Service Date: March 14, 2023 LOS: 4 days Chief Complaint: "I need to get back on my medicine"   Primary Psychiatric Diagnoses  MDD w/ psychotic features   Assessment   Tiffany [name] is a 63 y.o. AA female with a past psychiatric history of MDD with and without psychotic features, PTSD, schizoaffective disorder, and schizophrenia, with pertinent medical comorbidities/history that include hypertension, end-stage renal disease on hemodialysis Monday Wednesday Friday (stage 5), congestive heart failure, diabetes, COPD, obesity, osteoarthritis, and GERD, who presented this encounter as a transfer from the behavioral health urgent care, due to needing higher level of care for the patient's medical comorbidities, and after evaluation was performed and deemed that the patient needs  inpatient mental health hospitalization.  Patient initially presented to the behavioral health urgent care for endorsements of worsening paranoia, depressive symptoms, and homicidal ideations.  Patient currently is involuntarily committed at this time, but medically clear, per EDP team.   Upon reevaluation, patient endorses no instability in her mental health, denies auditory and or visual hallucinations, denies paranoia, does not present with any delusional themes that are appreciable, denies suicidal and or homicidal ideations, and endorses that since being back on her medications, states that she feels that she is ready for discharge, no safety concerns for  herself or others outside of the safe and secure environment in the hospital.  Given reevaluation conducted today, recommendation is for psychiatric clearance at this time, as well as the additional recommendations listed below.  Medications sent to patient's preferred pharmacy.  Spoke with Dr. Enedina Finner who is in agreement for psychiatric clearance, as well as additional recommendations listed below.  Diagnoses:  Active Hospital problems: Principal Problem:   MDD (major depressive disorder), recurrent, severe, with psychosis (HCC)     Plan   ## Psychiatric Recommendations:   -Recommend continue current psychiatric medication regimen listed below --> Seroquel 50 mg p.o. twice daily, morning and evening --> Mirtazapine 7.5 mg p.o. nightly --> Melatonin 5 mg p.o. nightly --> Lexapro 10 mg p.o. daily --> Duloxetine 30 mg p.o. daily --> Atarax 10 mg p.o. 3 times daily as needed -Recommend close outpatient follow-up with mental health resources -Recommend strict adherence to safety plan listed below  Safety Plan Tiffany [name] will reach out to Son, call 911 or call mobile crisis, or go to nearest emergency room if condition worsens or if suicidal thoughts become active Patients' will follow up with [facility] for outpatient psychiatric services (therapy/medication management).  The suicide prevention education provided includes the following: Suicide risk factors Suicide prevention and interventions National Suicide Hotline telephone number [facility] assessment telephone number [facility] Emergency Assistance 911 [facility] and/or Residential Mobile Crisis Unit telephone number  ## Medical Decision Making Capacity: Patient has capacity  ## Further Work-up: None at this time  ## Disposition:-- There are no psychiatric contraindications to discharge at this time  ##  Behavioral / Environmental: -Routine safety/agitation protocols until discharge    ## Safety and  Observation Level:  - Based on my clinical evaluation, I estimate the patient to be at low risk of self harm in the current setting and upon recommendation for discharge. - At this time, we recommend  1:1 Observation. This decision is based on my review of the chart including patient's history and current presentation, interview of the patient, mental status examination, and consideration of suicide risk including evaluating suicidal ideation, plan, intent, suicidal or self-harm behaviors, risk factors, and protective factors. This judgment is based on our ability to directly address suicide risk, implement suicide prevention strategies, and develop a safety plan while the patient is in the clinical setting. Please contact our team if there is a concern that risk level has changed.  CSSR Risk Category:C-SSRS RISK CATEGORY: Low Risk  Suicide Risk Assessment: Patient has following modifiable risk factors for suicide: None at this time, which we are addressing by recommendations/treatment. Patient has following non-modifiable or demographic risk factors for suicide: history of suicide attempt, history of self harm behavior, and psychiatric hospitalization Patient has the following protective factors against suicide: Access to outpatient mental health care, Supportive family, Supportive friends, Cultural, spiritual, or religious beliefs that discourage suicide, Frustration tolerance, no history of suicide attempts, and no history of NSSIB  Thank you for this consult request. Recommendations have been communicated to the primary team.  We will sign off at this time.   Lenox Ponds, NP     History of Present Illness   Tiffany [name] is a 63 y.o. AA female with a past psychiatric history of MDD with and without psychotic features, PTSD, schizoaffective disorder, and schizophrenia, with pertinent medical comorbidities/history that include hypertension, end-stage renal disease on hemodialysis Monday  Wednesday Friday (stage 5), congestive heart failure, diabetes, COPD, obesity, osteoarthritis, and GERD, who presented this encounter as a transfer from the behavioral health urgent care, due to needing higher level of care for the patient's medical comorbidities, and after evaluation was performed and deemed that the patient needs  inpatient mental health hospitalization.  Patient initially presented to the behavioral health urgent care for endorsements of worsening paranoia, depressive symptoms, and homicidal ideations.  Patient currently is involuntarily committed at this time, but medically clear, per EDP team.   Patient seen today at the most [facility] emergency department for face-to-face psychiatric reevaluation.  Upon reevaluation, patient endorses fair sleep over the night once able to get sleep. Patient endorses good toleration of medications prescribed, states that she feels much better being back on her medications, denies any problems with side effects and/or adverse effects, states good toleration.  Patient endorses no problems with depression and or anxiety, reiterates that these are better, states that she is ready to go home.  Patient endorses no paranoia, does not appear to be presenting with delusional themes, denies auditory and or visual hallucinations, and objectively, does not appear to be presenting with psychotic features.  Patient orientation is intact, no concerns for fluctuations in consciousness. Patient endorses no suicidal and or homicidal ideations, and when asked specifically about her mother, denies any desire to harm her. Patient endorses that she plans to return home with her son, patient asked if this writer could call her son to put safety planning in place, as well as give him also recommendations, but patient denied, states that, "I do not want a bother him, I am fine, I just want to go home".  Patient  and this Clinical research associate discussed safety planning nonetheless, discussed  recommendations for safe discharge, no questions or concerns after review and lengthy conversation.  Patient endorsed that her outpatient dialysis appointment is at 10 AM, asked this provider if she can undergo dialysis before leaving, instructed patient that this provider would express her concerns to primary EDP team.  Per nursing and chart review: No behavioral concerns, no endorsements of suicidal and or homicidal ideations, observations of psychosis, concerns for not sleeping or eating.  Review of Systems  Psychiatric/Behavioral:  Negative for depression, hallucinations, substance abuse and suicidal ideas. The patient is not nervous/anxious and does not have insomnia.   All other systems reviewed and are negative.    Psychiatric and Social History  Psychiatric History:   Information collected from chart review/patient   Prev Dx/Sx: MDD with and without psychotic features, PTSD, schizoaffective disorder, and schizophrenia Current Psych Provider: None Home Meds (current): None currently, but recently Seroquel, Cymbalta, Lexapro, melatonin, mirtazapine Previous Med Trials: Seroquel, Cymbalta, Lexapro, melatonin, mirtazapine Therapy: Yes, [facility], 11/10/2022, 9 outpatient   Prior Psych Hospitalization: Yes, [facility], 11/10/2022 Prior Self Harm: None endorsed or reported Prior Violence: None endorsed or reported, but has current homicidal ideations   Family Psych History: None endorsed or reported Family Hx suicide: None endorsed or reported   Social History:  Developmental Hx: WDL Educational Hx: High school Occupational Hx: Retired, on Designer, television/film set Hx: None endorsed or reported Living Situation: Lives with son Spiritual Hx: None endorsed or reported  Access to weapons/lethal means: None endorsed or reported    Substance History Alcohol: : None endorsed or reported  Type of alcohol : None endorsed or reported Last Drink : None endorsed or reported Number of drinks per  day : None endorsed or reported History of alcohol withdrawal seizures : None endorsed or reported History of DT's : None endorsed or reported Tobacco: Previous heavy smoker, none in recent years Illicit drugs: History of cocaine use, none in recent years, unable to further give detail Prescription drug abuse:  None endorsed or reported Rehab hx: None endorsed or reported  Exam Findings  Physical Exam: As below Vital Signs:  Temp:  [97.9 F (36.6 C)-98.8 F (37.1 C)] 97.9 F (36.6 C) (02/26 0500) Pulse Rate:  [78-92] 86 (02/26 1109) Resp:  [16-19] 16 (02/26 0500) BP: (116-169)/(66-105) 116/66 (02/26 1109) SpO2:  [96 %-97 %] 97 % (02/26 0500) Blood pressure 116/66, pulse 86, temperature 97.9 F (36.6 C), resp. rate 16, height 5\' 3"  (1.6 m), weight 89.8 kg, SpO2 97%. Body mass index is 35.07 kg/m.  Physical Exam Vitals and nursing note reviewed.  Constitutional:      General: [name] not in acute distress.    Appearance: [name] obese. [name] not ill-appearing, toxic-appearing or diaphoretic.  Pulmonary:     Effort: Pulmonary effort is normal.  Skin:    General: Skin is warm and dry.  Neurological:     Mental Status: [name] alert and oriented to person, place, and time.     Motor: No tremor or seizure activity.  Psychiatric:        Attention and Perception: Attention and perception normal. She does not perceive auditory or visual hallucinations.        Mood and Affect: Mood and affect normal.        Speech: Speech normal.        Behavior: Behavior is not agitated, aggressive or hyperactive. Behavior is cooperative.        Thought  Content: Thought content normal. Thought content is not paranoid or delusional. Thought content does not include homicidal or suicidal ideation.        Cognition and Memory: Cognition and memory normal.        Judgment: Judgment normal.    Mental Status Exam: General Appearance: Casual  Orientation:  Full (Time, Place, and Person)  Memory:    Within defined limits  Concentration:  Concentration: Fair and Attention Span: Fair  Recall:  Fair  Attention  Fair  Eye Contact:  Fair  Speech:  Clear and Coherent and Normal Rate  Language:  Fair  Volume:  Normal  Mood: Euthymic  Affect:   Largely neutral if mildly constricted  Thought Process:  Coherent, Goal Directed, and Linear  Thought Content:  Logical  Suicidal Thoughts:  No  Homicidal Thoughts:  No  Judgement:  Intact  Insight:  Present  Psychomotor Activity:  Normal  Akathisia:  No  Fund of Knowledge:  Fair      Assets:  Manufacturing systems engineer Desire for Improvement Financial Resources/Insurance Housing Leisure Time Resilience Social Support Talents/Skills Transportation Vocational/Educational  Cognition:  WNL  ADL's:  Impaired, at baseline  AIMS (if indicated):   0     Other History   These have been pulled in through the EMR, reviewed, and updated if appropriate.  Family History:  The patient's family history includes Anxiety disorder in her sister; Bipolar disorder in her sister; Cancer in her father and mother; Depression in her brother, mother, sister, and sister; Heart failure in an other family member; Schizophrenia in her sister.  Medical History: Past Medical History:  Diagnosis Date   Abdominal pain    Acute encephalopathy 11/21/2017   Acute exacerbation of CHF (congestive heart failure) (HCC) 03/03/2022   Acute GI bleeding 03/29/2021   Acute kidney injury superimposed on chronic kidney disease (HCC) 02/18/2020   Acute metabolic encephalopathy 02/20/2020   Acute on chronic diastolic heart failure (HCC) 02/04/2022   Acute on chronic renal failure (HCC) 06/02/2022   Aggressive behavior    Agitation 11/22/2017   Anoxic brain injury (HCC) 09/08/2016   C. Arrest due to respiratory failure and COPD exacerbation   Anxiety    Arthritis    "all over" (04/10/2016)   Asthma 10/18/2010   Binge eating disorder    Blood loss anemia 04/24/2011   CAP  (community acquired pneumonia) 06/22/2015   Cardiac arrest (HCC) 09/08/2016   PEA   Carotid artery stenosis    1-39% bilateral by dopplers 11/2016   Chronic diastolic (congestive) heart failure (HCC)    Chronic kidney disease, stage 3 (HCC)    Chronic kidney disease, stage 3b (HCC) 06/06/2021   Chronic pain syndrome 06/18/2012   Chronic post-traumatic stress disorder (PTSD) 05/27/2018   Chronic respiratory failure with hypoxia and hypercapnia (HCC) 06/22/2015   TRILOGY Vent >AVAPA-ES., Vt target 200-400, Max P 30 , PS max 20 , PS min 6-10 , E Max 6, E Min 4, Rate Auto AVAPS Rate 2 (titrate for pt comfort) , bleed O2 at 5l/m continuous flow .    CKD (chronic kidney disease) 02/03/2022   Closed displaced fracture of fifth metacarpal bone 03/21/2018   Cocaine use disorder, severe, in sustained remission (HCC) 12/17/2015   Complication of anesthesia    decreased bp, decreased heart rate   COPD (chronic obstructive pulmonary disease) (HCC) 07/08/2014   Delusional disorder, persecutory type (HCC) 06/26/2021   Depression    Diabetic neuropathy (HCC) 04/24/2011   Difficulty with speech  01/24/2018   Disorder of nervous system    Drug abuse (HCC) 11/21/2017   Dyslipidemia 04/24/2011   E. coli UTI 02/20/2020   Elevated troponin 04/28/2012   Emphysema    Encephalopathy 11/21/2017   Essential hypertension 03/22/2016   Facet arthropathy, lumbar 06/12/2022   Fibula fracture 07/10/2016   Frequent falls 10/11/2017   GERD (gastroesophageal reflux disease)    GI bleed 03/30/2021   Gout 04/11/2017   Heart attack (HCC) 1980s   Hematochezia    History of blood transfusion 1994   "couldn't stop bleeding from my period"   History of drug abuse in remission (HCC) 11/28/2015   Quit in 2017   Hyperlipidemia 03/31/2021   Hyperlipidemia LDL goal <70    Hypocalcemia    Hypokalemia    Hypomagnesemia    Incontinence    Manic depression (HCC)    Morbid obesity (HCC) 10/18/2010   Obstructive sleep  apnea 10/18/2010   On home oxygen therapy    "6L; 24/7" (04/10/2016)   OSA on CPAP    "wear mask sometimes" (04/10/2016)   Painless rectal bleeding 12/31/2015   Paranoid (HCC)    "sometimes; I'm on RX for it" (04/10/2016)   Periumbilical abdominal pain    Prolonged Q-T interval on ECG    Rectal bleeding 12/31/2015   Recurrent syncope 07/09/2021   Rhabdomyolysis 06/16/2021   Schizoaffective disorder, bipolar type (HCC) 04/05/2018   Seasonal allergies    Seborrheic keratoses 12/31/2013   Seizures (HCC)    "don't know what kind; last one was ~ 1 yr ago" (04/10/2016)   Sinus bradycardia    Skin ulcer of sacrum, limited to breakdown of skin (HCC) 03/08/2022   Spondylolisthesis at L3-L4 level 06/12/2022   Stroke (HCC) 1980s   denies residual on 04/10/2016   Thrush 09/19/2013   Type 2 diabetes mellitus (HCC) 10/18/2010    Surgical History: Past Surgical History:  Procedure Laterality Date   A/V FISTULAGRAM N/A 02/01/2023   Procedure: A/V Fistulagram;  Surgeon: Tyler Pita, MD;  Location: [facility] INVASIVE CV LAB;  Service: Cardiovascular;  Laterality: N/A;   AV FISTULA PLACEMENT Left 12/21/2022   Procedure: INSERTION OF LEFT ARM ARTERIOVENOUS (AV) GORE-TEX GRAFT;  Surgeon: Leonie Douglas, MD;  Location: [facility] OR;  Service: Vascular;  Laterality: Left;   CESAREAN SECTION  1997   COLONOSCOPY WITH PROPOFOL N/A 04/01/2021   Procedure: COLONOSCOPY WITH PROPOFOL;  Surgeon: Jeani Hawking, MD;  Location: [facility] ENDOSCOPY;  Service: Gastroenterology;  Laterality: N/A;  Rectal bleeding with drop in hemoglobin to 7.2 g/dL   HERNIA REPAIR     IR CHOLANGIOGRAM EXISTING TUBE  07/20/2016   IR PERC CHOLECYSTOSTOMY  05/10/2016   IR RADIOLOGIST EVAL & MGMT  06/08/2016   IR RADIOLOGIST EVAL & MGMT  06/29/2016   IR SINUS/FIST TUBE CHK-NON GI  07/12/2016   RIGHT/LEFT HEART CATH AND CORONARY ANGIOGRAPHY N/A 06/19/2017   Procedure: RIGHT/LEFT HEART CATH AND CORONARY ANGIOGRAPHY;  Surgeon: Dolores Patty, MD;   Location: [facility] INVASIVE CV LAB;  Service: Cardiovascular;  Laterality: N/A;   TIBIA IM NAIL INSERTION Right 07/12/2016   Procedure: INTRAMEDULLARY (IM) NAIL RIGHT TIBIA;  Surgeon: Tarry Kos, MD;  Location: [facility] OR;  Service: Orthopedics;  Laterality: Right;   UMBILICAL HERNIA REPAIR  ~ 1963   "that's why I don't have a belly button"   VAGINAL HYSTERECTOMY       Medications:   Current Facility-Administered Medications:    albuterol (PROVENTIL) (2.5 MG/3ML) 0.083% nebulizer solution 2.5 mg,  2.5 mg, Nebulization, Q6H PRN, Tegeler, Canary Brim, MD, 2.5 mg at 03/13/23 2237   amLODipine (NORVASC) tablet 10 mg, 10 mg, Oral, Daily, Laurence Spates, MD, 10 mg at 03/14/23 1111   atorvastatin (LIPITOR) tablet 20 mg, 20 mg, Oral, Daily, Laurence Spates, MD, 20 mg at 03/14/23 1103   calcitRIOL (ROCALTROL) capsule 0.5 mcg, 0.5 mcg, Oral, Daily, Fulton Reek H, MD, 0.5 mcg at 03/13/23 1732   carvedilol (COREG) tablet 12.5 mg, 12.5 mg, Oral, BID, Fulton Reek H, MD, 12.5 mg at 03/14/23 1111   Chlorhexidine Gluconate Cloth 2 % PADS 6 each, 6 each, Topical, Q0600, Delano Metz, MD, 6 each at 03/14/23 0454   Chlorhexidine Gluconate Cloth 2 % PADS 6 each, 6 each, Topical, Q0600, Estanislado Emms, MD, 6 each at 03/14/23 0981   cyclobenzaprine (FLEXERIL) tablet 10 mg, 10 mg, Oral, TID PRN, Laurence Spates, MD, 10 mg at 03/14/23 0420   dicyclomine (BENTYL) capsule 10 mg, 10 mg, Oral, TID Eliseo Gum, MD, 10 mg at 03/14/23 1914   DULoxetine (CYMBALTA) DR capsule 30 mg, 30 mg, Oral, Daily, Lenox Ponds, NP, 30 mg at 03/14/23 1102   escitalopram (LEXAPRO) tablet 10 mg, 10 mg, Oral, Daily, Lenox Ponds, NP, 10 mg at 03/14/23 1103   ezetimibe (ZETIA) tablet 10 mg, 10 mg, Oral, Daily, Fulton Reek H, MD, 10 mg at 03/13/23 1053   ferrous sulfate tablet 325 mg, 325 mg, Oral, Daily, Laurence Spates, MD, 325 mg at 03/14/23 1102   folic acid (FOLVITE) tablet 1 mg, 1 mg, Oral, Daily,  Laurence Spates, MD, 1 mg at 03/14/23 1103   hydrOXYzine (ATARAX) tablet 10 mg, 10 mg, Oral, TID PRN, Lenox Ponds, NP, 10 mg at 03/14/23 0421   lamoTRIgine (LAMICTAL) tablet 100 mg, 100 mg, Oral, Daily, Fulton Reek H, MD, 100 mg at 03/14/23 1102   melatonin tablet 5 mg, 5 mg, Oral, QHS, Lenox Ponds, NP, 5 mg at 03/13/23 2134   mirtazapine (REMERON) tablet 7.5 mg, 7.5 mg, Oral, QHS, Lenox Ponds, NP, 7.5 mg at 03/13/23 2134   ondansetron (ZOFRAN-ODT) disintegrating tablet 4 mg, 4 mg, Oral, Q8H PRN, Anders Simmonds T, DO, 4 mg at 03/14/23 1031   pantoprazole (PROTONIX) EC tablet 40 mg, 40 mg, Oral, Daily, Laurence Spates, MD, 40 mg at 03/14/23 1103   QUEtiapine (SEROQUEL) tablet 50 mg, 50 mg, Oral, BID, Lenox Ponds, NP, 50 mg at 03/14/23 1103   torsemide (DEMADEX) tablet 20 mg, 20 mg, Oral, BID, Laurence Spates, MD, 20 mg at 03/14/23 1103  Current Outpatient Medications:    albuterol (VENTOLIN HFA) 108 (90 Base) MCG/ACT inhaler, INHALE 2 PUFFS INTO THE LUNGS EVERY 6 HOURS AS NEEDED FOR WHEEZING OR SHORTNESS OF BREATH, Disp: 8.5 g, Rfl: 2   amLODipine (NORVASC) 10 MG tablet, Take 1 tablet (10 mg total) by mouth daily., Disp: 30 tablet, Rfl: 5   Asenapine Maleate (SAPHRIS) 10 MG SUBL, Place 1 tablet under the tongue daily., Disp: , Rfl:    atorvastatin (LIPITOR) 20 MG tablet, Take 1 tablet (20 mg total) by mouth daily., Disp: 30 tablet, Rfl: 5   calcitRIOL (ROCALTROL) 0.5 MCG capsule, Take 1 capsule (0.5 mcg total) by mouth daily., Disp: 30 capsule, Rfl: 5   carvedilol (COREG) 12.5 MG tablet, Take 1 tablet (12.5 mg total) by mouth 2 (two) times daily., Disp: 60 tablet, Rfl: 5   cyclobenzaprine (FLEXERIL) 10 MG tablet, Take 1 tablet (  10 mg total) by mouth 3 (three) times daily., Disp: 90 tablet, Rfl: 5   dicyclomine (BENTYL) 10 MG capsule, Take 1 capsule (10 mg total) by mouth 3 (three) times daily before meals., Disp: 90 capsule, Rfl: 5   DULoxetine (CYMBALTA) 30 MG  capsule, Take 1 capsule (30 mg total) by mouth daily., Disp: 30 capsule, Rfl: 5   epoetin alfa (EPOGEN) 10000 UNIT/ML injection, Inject 1 mL (10,000 Units total) into the vein every Monday, Wednesday, and Friday with hemodialysis., Disp: 1 mL, Rfl: 3   escitalopram (LEXAPRO) 20 MG tablet, Take 1 tablet (20 mg total) by mouth daily., Disp: 30 tablet, Rfl: 5   ezetimibe (ZETIA) 10 MG tablet, Take 1 tablet (10 mg total) by mouth daily., Disp: 30 tablet, Rfl: 5   ferrous sulfate 325 (65 FE) MG tablet, Take 1 tablet (325 mg total) by mouth every other day. (Patient taking differently: Take 325 mg by mouth daily.), Disp: 30 tablet, Rfl: 5   Fluticasone-Umeclidin-Vilant (TRELEGY ELLIPTA) 100-62.5-25 MCG/ACT AEPB, INHALE 1 PUFF INTO THE LUNGS DAILY, Disp: 60 each, Rfl: 0   folic acid (FOLVITE) 1 MG tablet, Take 1 tablet (1 mg total) by mouth daily., Disp: 30 tablet, Rfl: 5   ibuprofen (ADVIL) 200 MG tablet, Take 400 mg by mouth 3 (three) times daily., Disp: , Rfl:    lamoTRIgine (LAMICTAL) 100 MG tablet, Take 1 tablet (100 mg total) by mouth daily., Disp: 30 tablet, Rfl: 5   lidocaine (LIDODERM) 5 %, Place 1 patch onto the skin daily. Remove & Discard patch within 12 hours or as directed by MD (Patient taking differently: Place 1 patch onto the skin daily. Remove & Discard patch within 12 hours or as directed by MD - Left side), Disp: 3 patch, Rfl: 0   loperamide (IMODIUM) 2 MG capsule, Take 1 capsule (2 mg total) by mouth 2 (two) times daily., Disp: 30 capsule, Rfl: 5   Melatonin 10 MG CAPS, Take 10 mg by mouth at bedtime as needed (Sleep)., Disp: , Rfl:    metolazone (ZAROXOLYN) 2.5 MG tablet, Take 2.5 mg by mouth every Monday, Wednesday, and Friday., Disp: , Rfl:    mirtazapine (REMERON SOL-TAB) 15 MG disintegrating tablet, Take 15 mg by mouth at bedtime., Disp: , Rfl:    omeprazole (PRILOSEC) 40 MG capsule, Take 1 capsule (40 mg total) by mouth every morning., Disp: 30 capsule, Rfl: 5   potassium chloride  SA (KLOR-CON M) 20 MEQ tablet, Take 20 mEq by mouth See admin instructions. Sometimes the patient needs and sometimes she doesn't. [name] instructed by the dialysis center on when to take it., Disp: , Rfl:    QUEtiapine (SEROQUEL) 100 MG tablet, Take 1 tablet (100 mg total) by mouth in the morning AND 2 tablets (200 mg total) every evening., Disp: 90 tablet, Rfl: 2   torsemide (DEMADEX) 20 MG tablet, Take 1 tablet (20 mg total) by mouth 2 (two) times daily. (Patient taking differently: Take 20 mg by mouth in the morning, at noon, and at bedtime.), Disp: 60 tablet, Rfl: 2   azithromycin (ZITHROMAX) 250 MG tablet, Take 2  tablets by mouth  on day one then 1 tablet once a day for four days. (Patient not taking: Reported on 03/11/2023), Disp: 6 tablet, Rfl: 0   prazosin (MINIPRESS) 2 MG capsule, Take 1 capsule (2 mg total) by mouth at bedtime. (Patient not taking: Reported on 03/11/2023), Disp: 30 capsule, Rfl: 5   pregabalin (LYRICA) 100 MG capsule, Take 100  mg by mouth 3 (three) times daily. (Patient not taking: Reported on 03/11/2023), Disp: , Rfl:   Allergies: Allergies  Allergen Reactions   Asa [Aspirin] Other (See Comments)    GI bleeds   Hydrocodone Shortness Of Breath and Hives   Latuda [Lurasidone Hcl] Anaphylaxis   Magnesium-Containing Compounds Anaphylaxis and Other (See Comments)    Tolerated Ensure   Prednisone Anaphylaxis, Swelling and Other (See Comments)    Swelling of tongue, lips, throat   Ultram [Tramadol] Anaphylaxis, Swelling and Rash   Codeine Nausea And Vomiting and Rash   Desyrel [Trazodone] Other (See Comments)    Paranoia    Hydralazine Other (See Comments)    Muscle spasms   Peanut (Diagnostic) Hives and Other (See Comments)    Raw peanuts only  OK to eat peanut butter.   Peanut-Containing Drug Products Hives    Raw peanuts, but can eat peanut butter   Topamax [Topiramate] Other (See Comments)    Paranoia   Sulfa Antibiotics Itching   Tape Rash    Lenox Ponds, NP

## 2023-03-15 ENCOUNTER — Ambulatory Visit: Payer: PPO | Admitting: Physician Assistant

## 2023-03-15 ENCOUNTER — Encounter: Payer: Self-pay | Admitting: Physician Assistant

## 2023-03-15 DIAGNOSIS — Z87891 Personal history of nicotine dependence: Secondary | ICD-10-CM | POA: Diagnosis not present

## 2023-03-15 MED ORDER — DULOXETINE HCL 30 MG PO CPEP: 30.0000 mg | ORAL_CAPSULE | Freq: Every day | ORAL | Status: DC

## 2023-03-15 MED ORDER — ACETAMINOPHEN 325 MG PO TABS
650.0000 mg | ORAL_TABLET | Freq: Four times a day (QID) | ORAL | Status: DC | PRN
Start: 2023-03-15 — End: 2023-03-15

## 2023-03-15 MED ORDER — FOLIC ACID 1 MG PO TABS: 1.0000 mg | ORAL_TABLET | Freq: Every day | ORAL | Status: DC

## 2023-03-15 MED ORDER — CHLORHEXIDINE GLUCONATE CLOTH 2 % EX PADS: 6.0000 | MEDICATED_PAD | Freq: Every day | CUTANEOUS | Status: DC

## 2023-03-15 MED ORDER — QUETIAPINE FUMARATE 25 MG PO TABS
50.0000 mg | ORAL_TABLET | Freq: Two times a day (BID) | ORAL | Status: DC
Start: 2023-03-15 — End: 2023-03-15

## 2023-03-15 MED ORDER — ALBUTEROL SULFATE (2.5 MG/3ML) 0.083% IN NEBU: 2.5000 mg | INHALATION_SOLUTION | Freq: Four times a day (QID) | RESPIRATORY_TRACT | Status: DC | PRN

## 2023-03-15 MED ORDER — MIRTAZAPINE 15 MG PO TABS: 7.5000 mg | ORAL_TABLET | Freq: Every day | ORAL | Status: DC

## 2023-03-15 MED ORDER — FERROUS SULFATE 325 (65 FE) MG PO TABS: 325.0000 mg | ORAL_TABLET | Freq: Every day | ORAL | Status: DC

## 2023-03-15 MED ORDER — TORSEMIDE 20 MG PO TABS: 20.0000 mg | ORAL_TABLET | Freq: Two times a day (BID) | ORAL | Status: DC

## 2023-03-15 MED ORDER — OLANZAPINE 5 MG PO TBDP
5.0000 mg | ORAL_TABLET | Freq: Three times a day (TID) | ORAL | Status: DC | PRN
Start: 2023-03-15 — End: 2023-03-15

## 2023-03-15 MED ORDER — PANTOPRAZOLE SODIUM 40 MG PO TBEC: 40.0000 mg | DELAYED_RELEASE_TABLET | Freq: Every day | ORAL | Status: DC

## 2023-03-15 MED ORDER — MELATONIN 5 MG PO TABS: 5.0000 mg | ORAL_TABLET | Freq: Every day | ORAL | Status: DC

## 2023-03-15 MED ORDER — OLANZAPINE 10 MG IM SOLR: 5.0000 mg | Freq: Three times a day (TID) | INTRAMUSCULAR | Status: DC | PRN

## 2023-03-15 MED ORDER — CALCITRIOL 0.25 MCG PO CAPS
0.5000 ug | ORAL_CAPSULE | Freq: Every day | ORAL | Status: DC
Start: 2023-03-15 — End: 2023-03-15
  Filled 2023-03-15: qty 2

## 2023-03-15 MED ORDER — CARVEDILOL 6.25 MG PO TABS: 12.5000 mg | ORAL_TABLET | Freq: Two times a day (BID) | ORAL | Status: DC

## 2023-03-15 MED ORDER — AMLODIPINE BESYLATE 5 MG PO TABS: 10.0000 mg | ORAL_TABLET | Freq: Every day | ORAL | Status: DC

## 2023-03-15 MED ORDER — EZETIMIBE 10 MG PO TABS
10.0000 mg | ORAL_TABLET | Freq: Every day | ORAL | Status: DC
  Filled 2023-03-15: qty 1

## 2023-03-15 MED ORDER — CYCLOBENZAPRINE HCL 10 MG PO TABS: 10.0000 mg | ORAL_TABLET | Freq: Three times a day (TID) | ORAL | Status: DC | PRN

## 2023-03-15 MED ORDER — ESCITALOPRAM OXALATE 10 MG PO TABS: 10.0000 mg | ORAL_TABLET | Freq: Every day | ORAL | Status: DC

## 2023-03-15 MED ORDER — ATORVASTATIN CALCIUM 10 MG PO TABS
20.0000 mg | ORAL_TABLET | Freq: Every day | ORAL | Status: DC
Start: 2023-03-15 — End: 2023-03-15

## 2023-03-15 MED ORDER — LAMOTRIGINE 100 MG PO TABS
100.0000 mg | ORAL_TABLET | Freq: Every day | ORAL | Status: DC
Start: 2023-03-15 — End: 2023-03-15

## 2023-03-15 NOTE — Group Note (Signed)
 Recreation Therapy Group Note   Group Topic:Communication  Group Date: 03/15/2023 Start Time:  2:00 PM End Time:  3:00 PM Facilitators: Rosina Lowenstein, LRT, CTRS Location:  Dayroom  Group Description: Emotional Check in. Patient sat and talked with LRT about how they are doing and whatever else is on their mind. LRT provided active listening, reassurance and encouragement. Pts were given the opportunity to listen to music or color mandalas while they talk.    Goal Area(s) Addressed: Patient will engage in conversation with LRT. Patient will communicate their wants, needs, or questions.  Patient will practice a new coping skill of "talking to someone".   Affect/Mood: N/A   Participation Level: Did not attend    Clinical Observations/Individualized Feedback: Patient did not attend due to not being on the unit.   Plan: Continue to engage patient in RT group sessions 2-3x/week.   Rosina Lowenstein, LRT, CTRS 03/15/2023 4:55 PM

## 2023-03-15 NOTE — Progress Notes (Signed)
 Virtual Visit via Telephone Note  I connected with Tiffany [name] on 03/15/23 at 9:47 AM  by telephone and verified that I am speaking with the correct person using two identifiers.  Location: Patient: home Provider: working virtually from home   I discussed the limitations, risks, security and privacy concerns of performing an evaluation and management service by telephone and the availability of in person appointments. I also discussed with the patient that there may be a patient responsible charge related to this service. The patient expressed understanding and agreed to proceed.       Shared Decision Making Visit Lung Cancer Screening Program [phone])   Eligibility: Age 63 Pack Years Smoking History Calculation 60 (# packs/per year x # years smoked) Recent History of coughing up blood  no Unexplained weight loss? No ( >Than 15 pounds within the last 6 months ) Prior History Lung / other cancer No (Diagnosis within the last 5 years already requiring surveillance chest CT Scans). Smoking Status Former Smoker Former Smokers: Years since quit: 6 years  Quit Date: 2019  Visit Components: Discussion included one or more decision making aids? Yes Discussion included risk/benefits of screening. Yes Discussion included potential follow up diagnostic testing for abnormal scans. Yes Discussion included meaning and risk of over diagnosis. Yes Discussion included meaning and risk of False Positives. Yes Discussion included meaning of total radiation exposure. Yes  Counseling Included: Importance of adherence to annual lung cancer LDCT screening. Yes Impact of comorbidities on ability to participate in the program. Yes Ability and willingness to under diagnostic treatment. Yes  Smoking Cessation Counseling: Former Smokers:  Discussed the importance of maintaining cigarette abstinence. Yes Diagnosis Code: Personal History of Nicotine Dependence. O13.086 Information about  tobacco cessation classes and interventions provided to patient. Yes Written Order for Lung Cancer Screening with LDCT placed in Epic. Yes (CT Chest Lung Cancer Screening Low Dose W/O CM) [id number] Z12.2-Screening of respiratory organs Z87.891-Personal history of nicotine dependence    I spent 25 minutes of face to face time/virtual visit time  with the patient discussing the risks and benefits of lung cancer screening. We took the time to pause at intervals to allow for questions to be asked and answered to ensure understanding. We discussed that they had taken the single most powerful action possible to decrease their risk of developing lung cancer when they quit smoking. I counseled them to remain smoke free, and to contact the office if they ever had the desire to smoke again so that we can provide resources and tools to help support the effort to remain smoke free. We discussed the time and location of the scan, and they  will receive a call or letter with the results within  24-72 hours of receiving them. They have the office contact information in the event they have questions.   They verbalized understanding of all of the above and had no further questions.    I explained to the patient that there has been a high incidence of coronary artery disease noted on these exams. I explained that this is a non-gated exam therefore degree or severity cannot be determined. This patient is on statin therapy. I have asked the patient to follow-up with their PCP regarding any incidental finding of coronary artery disease and management with diet or medication as they feel is clinically indicated. The patient verbalized understanding of the above and had no further questions.      Darcella Gasman Felicidad Sugarman, PA-C

## 2023-03-15 NOTE — Patient Instructions (Signed)
 Thank you for participating in the [facility]. It was our pleasure to meet you today. We will call you with the results of your scan within the next few days. Your scan will be assigned a Lung RADS category score by the physicians reading the scans.  This Lung RADS score determines follow up scanning.  See below for description of categories, and follow up screening recommendations. We will be in touch to schedule your follow up screening annually or based on recommendations of our providers. We will fax a copy of your scan results to your Primary Care Physician, or the physician who referred you to the program, to ensure they have the results. Please call the office if you have any questions or concerns regarding your scanning experience or results.  Our office number is [phone]. Please speak with Abigail Miyamoto, RN., Karlton Lemon RN, or Pietro Cassis RN. They are  our Lung Cancer Screening RN.'s If They are unavailable when you call, Please leave a message on the voice mail. We will return your call at our earliest convenience.This voice mail is monitored several times a day.  Remember, if your scan is normal, we will scan you annually as long as you continue to meet the criteria for the program. (Age 64-80, Current smoker or smoker who has quit within the last 15 years). If you are a smoker, remember, quitting is the single most powerful action that you can take to decrease your risk of lung cancer and other pulmonary, breathing related problems. We know quitting is hard, and we are here to help.  Please let us know if there is anything we can do to help you meet your goal of quitting. If you are a former smoker, Counselling psychologist. We are proud of you! Remain smoke free! Remember you can refer friends or family members through the number above.  We will screen them to make sure they meet criteria for the program. Thank you for helping [name] take better care of you  by participating in Lung Screening.   Lung RADS Categories:  Lung RADS 1: no nodules or definitely non-concerning nodules.  Recommendation is for a repeat annual scan in 12 months.  Lung RADS 2:  nodules that are non-concerning in appearance and behavior with a very low likelihood of becoming an active cancer. Recommendation is for a repeat annual scan in 12 months.  Lung RADS 3: nodules that are probably non-concerning , includes nodules with a low likelihood of becoming an active cancer.  Recommendation is for a 38-month repeat screening scan. Often noted after an upper respiratory illness. We will be in touch to make sure you have no questions, and to schedule your 51-month scan.  Lung RADS 4 A: nodules with concerning findings, recommendation is most often for a follow up scan in 3 months or additional testing based on our provider's assessment of the scan. We will be in touch to make sure you have no questions and to schedule the recommended 3 month follow up scan.  Lung RADS 4 B:  indicates findings that are concerning. We will be in touch with you to schedule additional diagnostic testing based on our provider's  assessment of the scan.  You can receive free nicotine replacement therapy ( patches, gum or mints) by calling 1-800-QUIT NOW. Please call so we can get you on the path to becoming  a non-smoker. I know it is hard, but you can do this!  Other options for assistance in  smoking cessation ( As covered by your insurance benefits)  Hypnosis for smoking cessation  [employer] [phone]  Acupuncture for smoking cessation  [employer] [phone]

## 2023-03-20 ENCOUNTER — Inpatient Hospital Stay: Admission: RE | Admit: 2023-03-20 | Payer: PPO | Source: Ambulatory Visit

## 2023-03-22 ENCOUNTER — Other Ambulatory Visit: Payer: Self-pay

## 2023-03-23 ENCOUNTER — Other Ambulatory Visit: Payer: Self-pay

## 2023-03-27 ENCOUNTER — Other Ambulatory Visit: Payer: Self-pay

## 2023-03-27 ENCOUNTER — Other Ambulatory Visit (HOSPITAL_COMMUNITY): Payer: Self-pay

## 2023-04-04 ENCOUNTER — Other Ambulatory Visit (HOSPITAL_COMMUNITY): Payer: Self-pay

## 2023-04-04 ENCOUNTER — Other Ambulatory Visit: Payer: Self-pay

## 2023-04-04 ENCOUNTER — Encounter (HOSPITAL_COMMUNITY): Payer: Self-pay

## 2023-04-05 ENCOUNTER — Other Ambulatory Visit: Payer: Self-pay | Admitting: Family Medicine

## 2023-04-05 ENCOUNTER — Other Ambulatory Visit: Payer: Self-pay

## 2023-04-05 ENCOUNTER — Other Ambulatory Visit (HOSPITAL_COMMUNITY): Payer: Self-pay

## 2023-04-05 ENCOUNTER — Encounter (HOSPITAL_COMMUNITY): Payer: Self-pay

## 2023-04-05 NOTE — Progress Notes (Signed)
 Chart was reviewed and it appears patient needed viral swab for psychiatric placement

## 2023-04-06 ENCOUNTER — Other Ambulatory Visit: Payer: Self-pay

## 2023-04-14 ENCOUNTER — Other Ambulatory Visit (HOSPITAL_BASED_OUTPATIENT_CLINIC_OR_DEPARTMENT_OTHER): Payer: Self-pay

## 2023-04-14 ENCOUNTER — Other Ambulatory Visit: Payer: Self-pay

## 2023-04-16 ENCOUNTER — Other Ambulatory Visit (HOSPITAL_COMMUNITY): Payer: Self-pay

## 2023-04-16 ENCOUNTER — Other Ambulatory Visit: Payer: Self-pay

## 2023-04-17 ENCOUNTER — Other Ambulatory Visit (HOSPITAL_COMMUNITY): Payer: Self-pay

## 2023-04-21 ENCOUNTER — Other Ambulatory Visit (HOSPITAL_COMMUNITY): Payer: Self-pay

## 2023-04-24 IMAGING — CT CT ABD-PELV W/O CM
2 of 4 series · 15 of 46 positions shown, 17 images · non-contrast
Comparison: CT abdomen pelvis dated 10/30/2019 abdominal radiograph
dated 02/21/2020.

CLINICAL DATA: 60-year-old female with abdominal pain.

EXAM:
CT ABDOMEN AND PELVIS WITHOUT CONTRAST
TECHNIQUE: Multidetector CT imaging of the abdomen and pelvis was performed
following the standard protocol without IV contrast.

[Series 3: abd/ pelvis 5.0 i30f 2 · axial · 0.72mm/px · z∈[+936,+1286]mm · 12 of 84 slices shown, 14 images]
[im 7/84  soft-tissue]
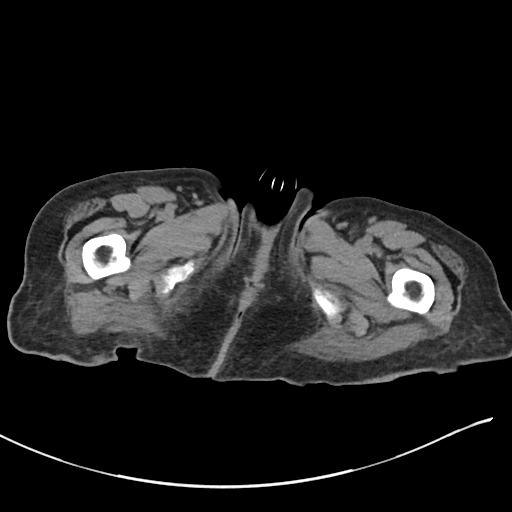
[im 7/84  bone]
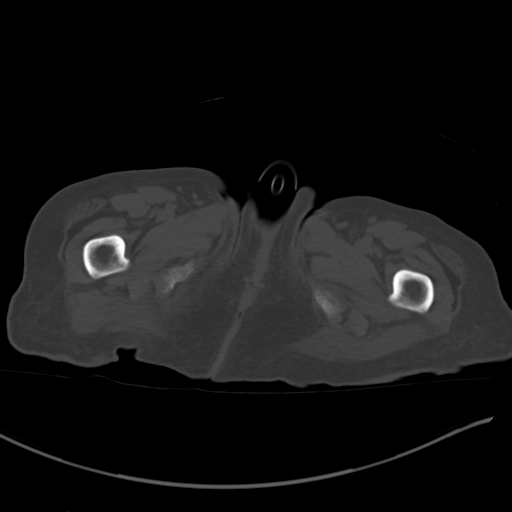
[im 13/84  soft-tissue]
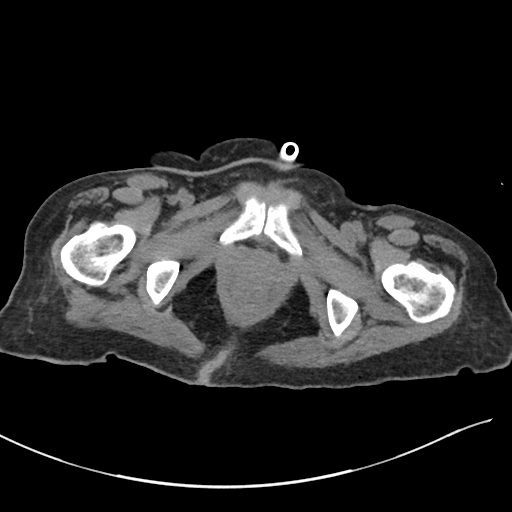
[im 20/84  soft-tissue]
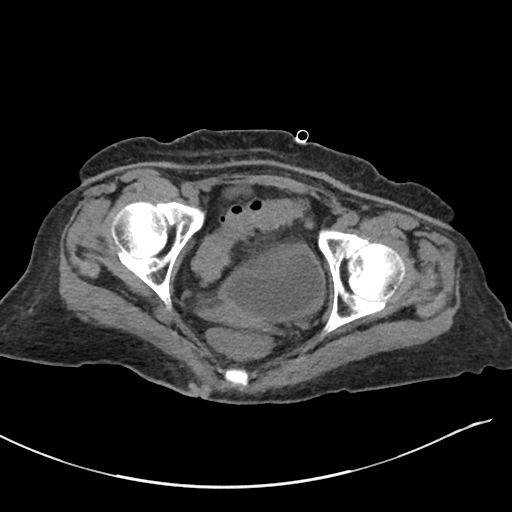
[im 26/84  soft-tissue]
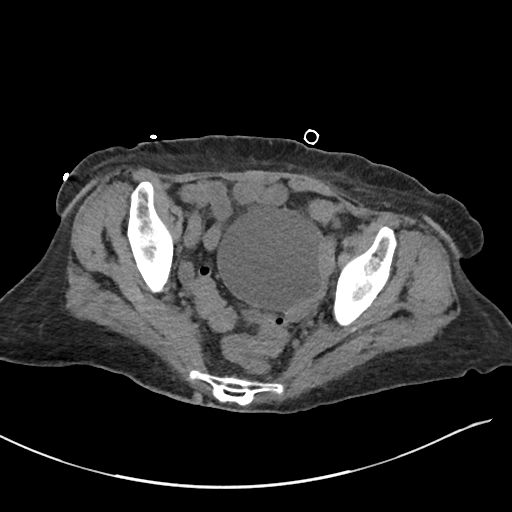
[im 32/84  soft-tissue]
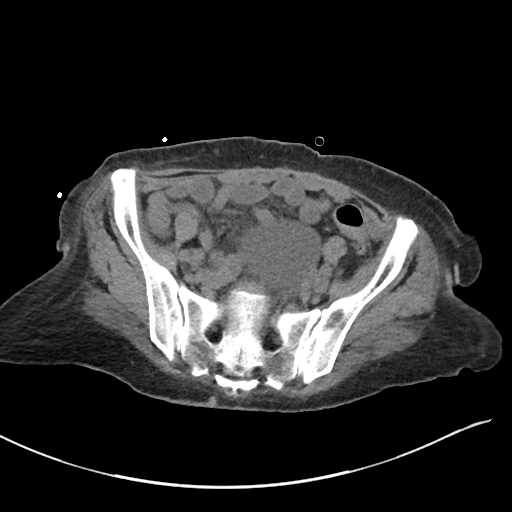
[im 39/84  soft-tissue]
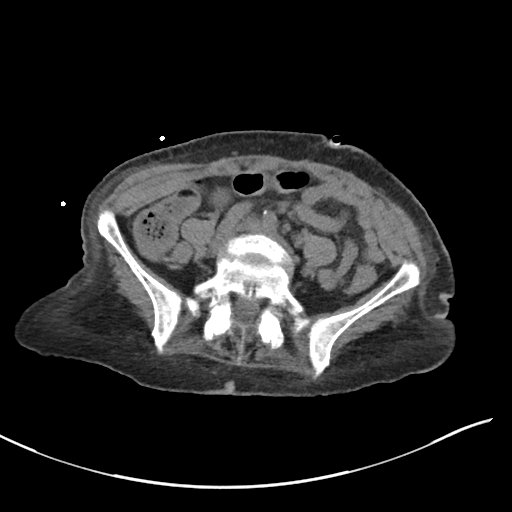
[im 45/84  soft-tissue]
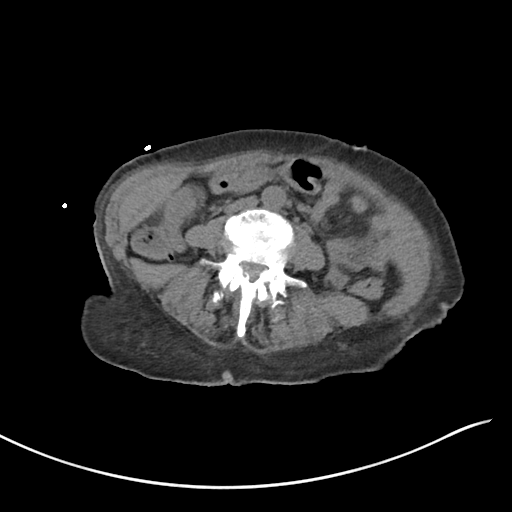
[im 52/84  soft-tissue]
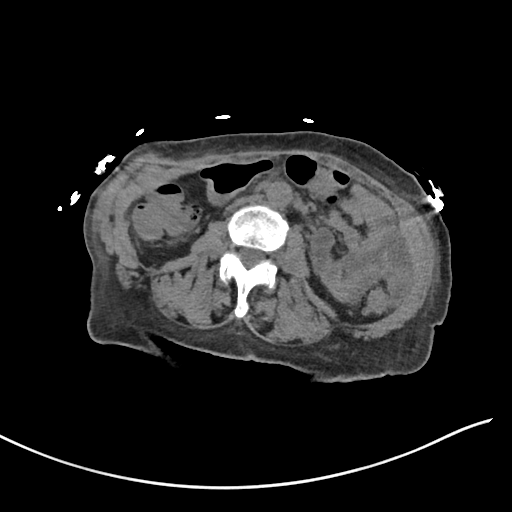
[im 58/84  soft-tissue]
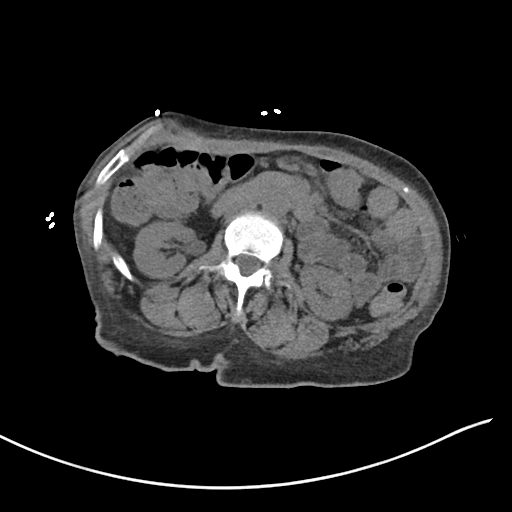
[im 58/84  bone]
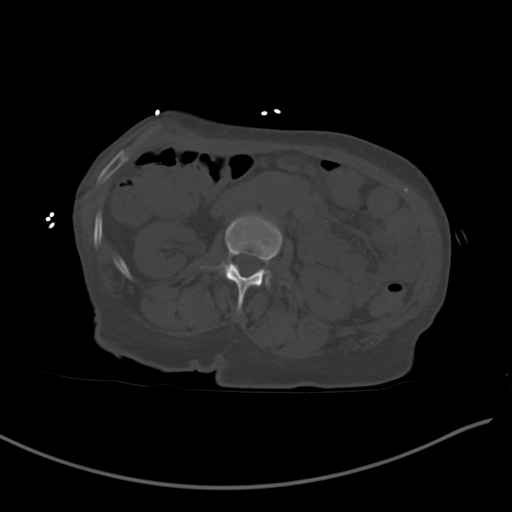
[im 64/84  soft-tissue]
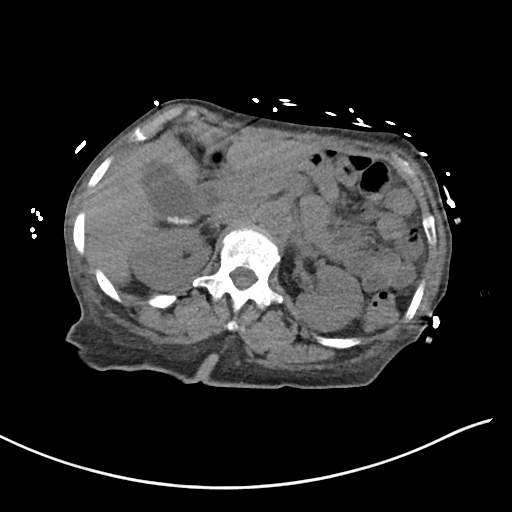
[im 71/84  soft-tissue]
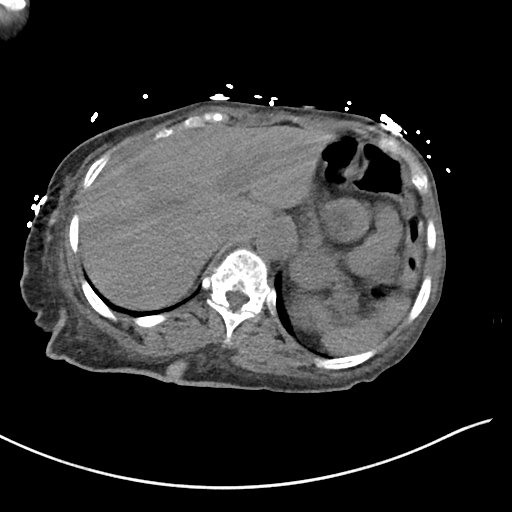
[im 77/84  soft-tissue]
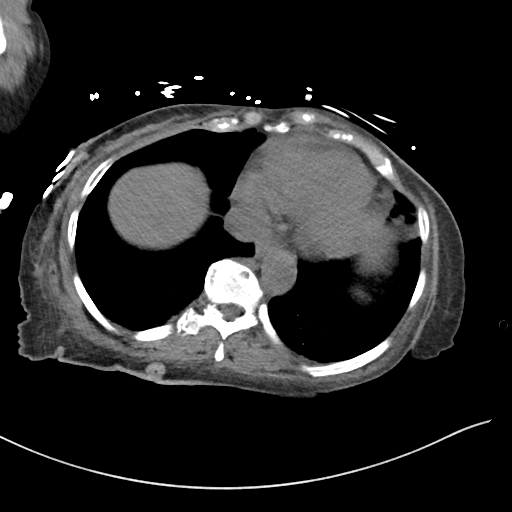

[Series 6: cor st · coronal · 0.66mm/px · 3 of 93 slices shown]
[im 31/93  soft-tissue]
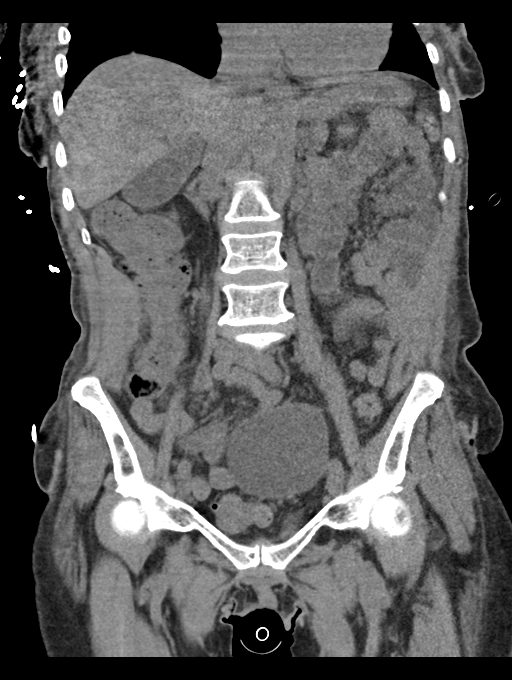
[im 41/93  soft-tissue]
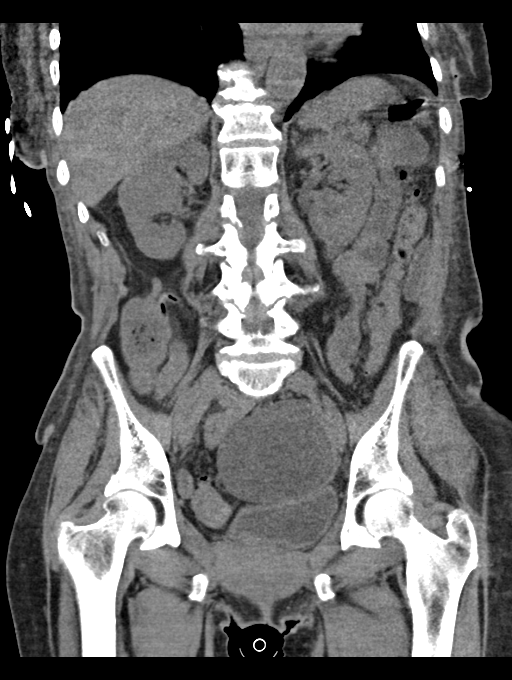
[im 52/93  soft-tissue]
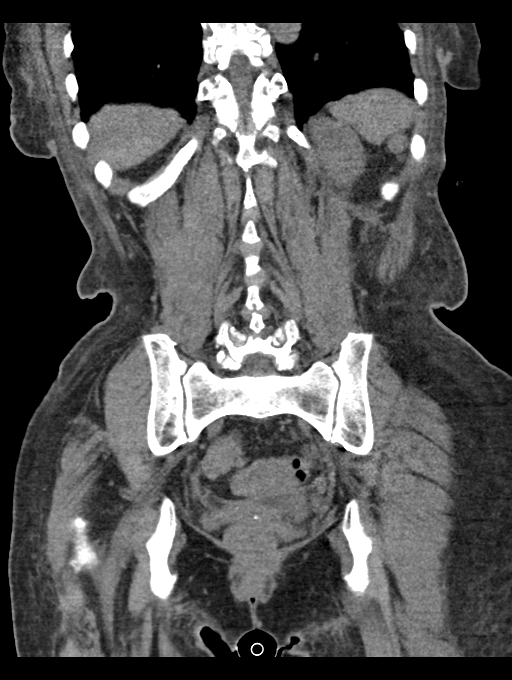

[15 of 46 positions shown; findings below may reference images not displayed]

FINDINGS: Evaluation of this exam is limited in the absence of intravenous
contrast.

Lower chest: The visualized lung bases are clear.

No intra-abdominal free air or free fluid.

Hepatobiliary: The liver is unremarkable. No intrahepatic biliary
dilatation. Multiple stones noted in the gallbladder. No
pericholecystic fluid or evidence of acute cholecystitis by CT.

Pancreas: The pancreas is suboptimally visualized but grossly
unremarkable. No active inflammatory changes.

Spleen: Normal in size without focal abnormality.

Adrenals/Urinary Tract: The adrenal glands unremarkable. The
kidneys, and the visualized ureters appear unremarkable. There is
apparent focal area thickening of the left posterior bladder wall
measuring approximately 5 mm in thickness (66/3 and 57/6). This area
is suboptimally evaluated by CT. A urothelial lesion is not
excluded. Further evaluation with cystoscopy is recommended.

Stomach/Bowel: There is sigmoid diverticulosis and scattered colonic
diverticula without active inflammatory changes. There is no bowel
obstruction or active inflammation. The appendix is normal.

Vascular/Lymphatic: Mild aortoiliac atherosclerotic disease. The IVC
is unremarkable. No portal venous gas. There is no adenopathy.

Reproductive: Hysterectomy. There is an 8 cm cystic lesion arising
from the left adnexa. Because this lesion is not adequately
characterized, prompt US is recommended for further evaluation.
Note: This recommendation does not apply to premenarchal patients
and to those with increased risk (genetic, family history, elevated
tumor markers or other high-risk factors) of ovarian cancer.
Reference: JACR [DATE]):[id number]

Other: Mild diffuse subcutaneous stranding. No fluid collection.

Musculoskeletal: Osteopenia with degenerative changes of the spine.
Grade 1 L3-L4 anterolisthesis. Multilevel facet arthropathy. No
acute osseous pathology.
IMPRESSION: 1. No acute intra-abdominal or pelvic pathology.
2. Colonic diverticulosis. No bowel obstruction. Normal appendix.
3. Cholelithiasis.
4. A 8 cm left adnexal cystic lesion. Further evaluation with
ultrasound is recommended.
5. Aortic Atherosclerosis (G1OVK-L70.0).

## 2023-04-26 ENCOUNTER — Other Ambulatory Visit: Payer: Self-pay | Admitting: Internal Medicine

## 2023-04-26 ENCOUNTER — Other Ambulatory Visit: Payer: Self-pay | Admitting: Student

## 2023-04-27 IMAGING — CT CT HEAD W/O CM
4 series · 16 of 47 positions shown, 18 images · non-contrast
Comparison: 10/27/2019

CLINICAL DATA: Altered mental status

EXAM:
CT HEAD WITHOUT CONTRAST
TECHNIQUE: Contiguous axial images were obtained from the base of the skull
through the vertex without intravenous contrast.

[Series 3: head bone · axial · 0.43mm/px · z∈[+1469,+1501]mm · 3 of 78 slices shown]
[im 8/78  bone]
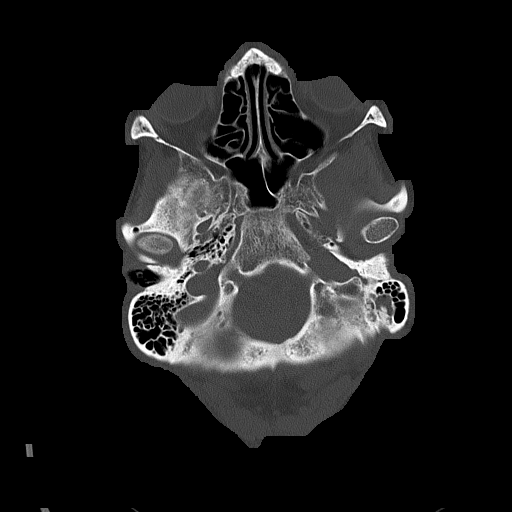
[im 16/78  bone]
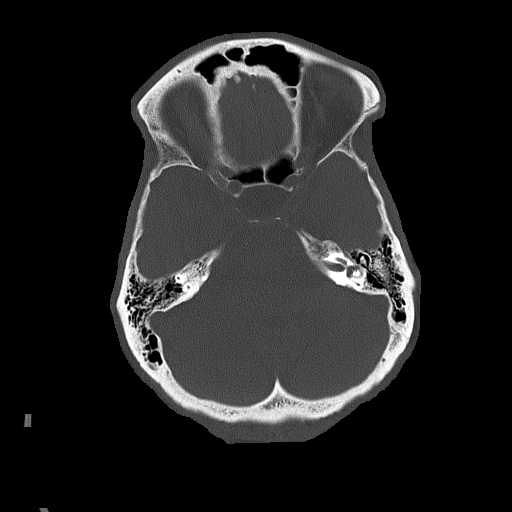
[im 24/78  bone]
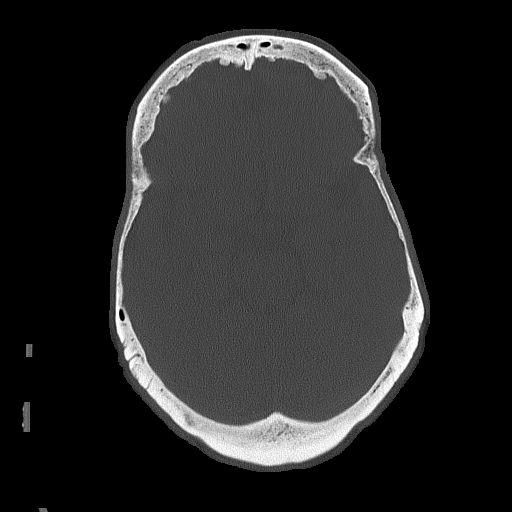

[Series 4: head without · axial · non-contrast · 0.43mm/px · z∈[+1470,+1585]mm · 7 of 31 slices shown, 9 images]
[im 4/31  brain]
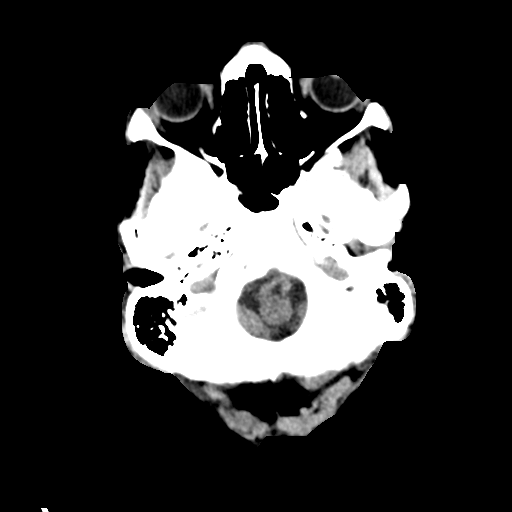
[im 4/31  bone]
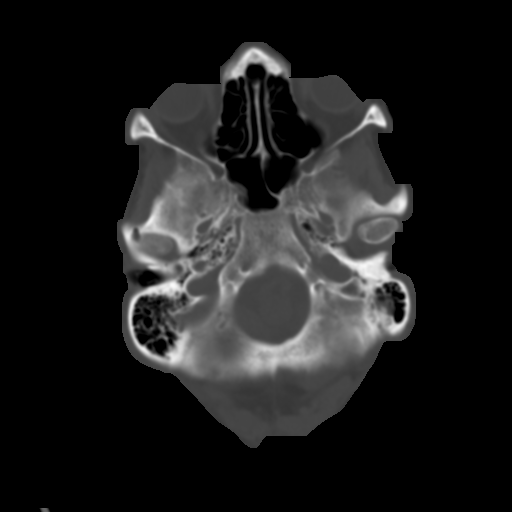
[im 8/31  brain]
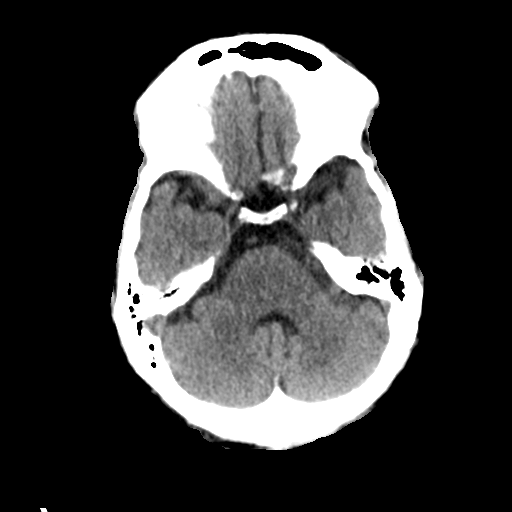
[im 12/31  brain]
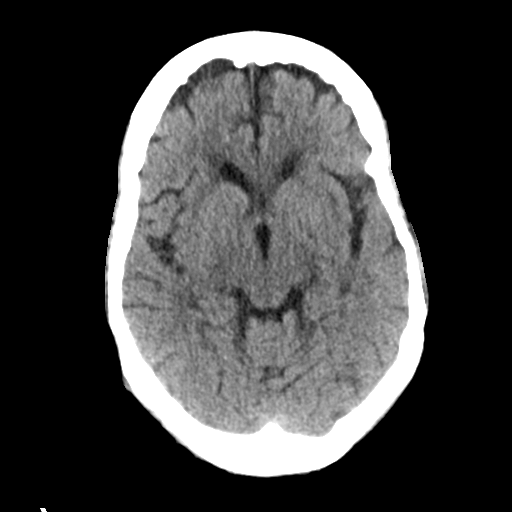
[im 16/31  brain]
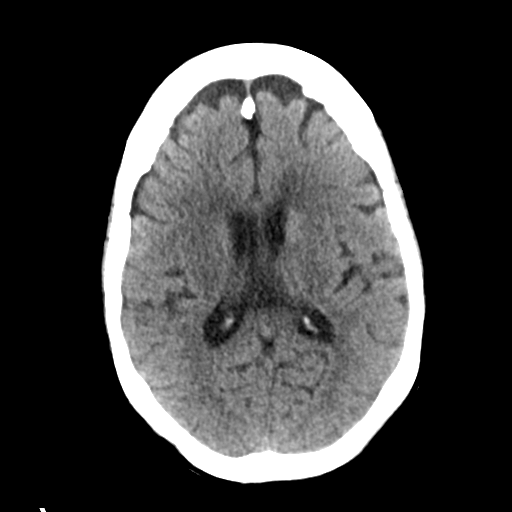
[im 19/31  brain]
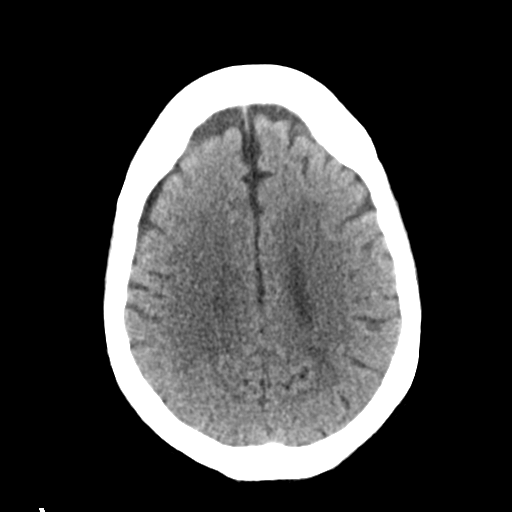
[im 19/31  bone]
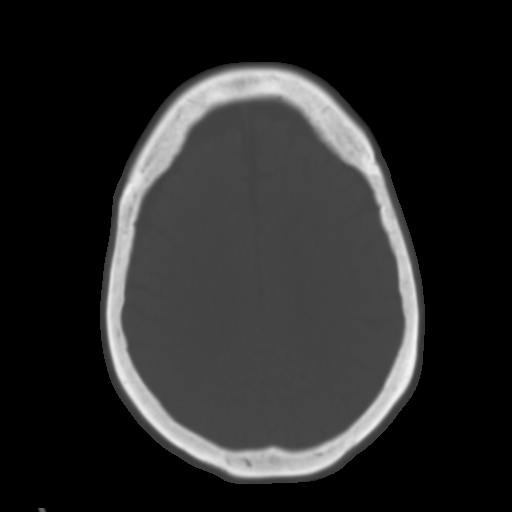
[im 23/31  brain]
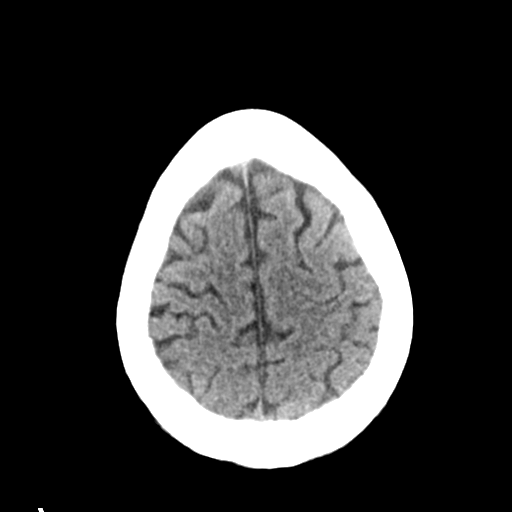
[im 27/31  brain]
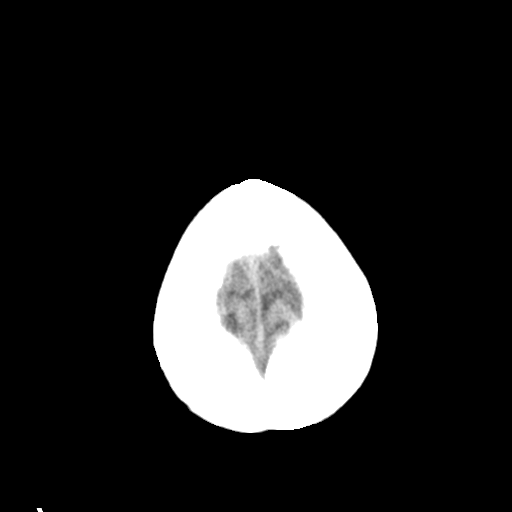

[Series 5: head without cor · coronal · non-contrast · 0.30mm/px · 3 of 67 slices shown]
[im 23/67  brain]
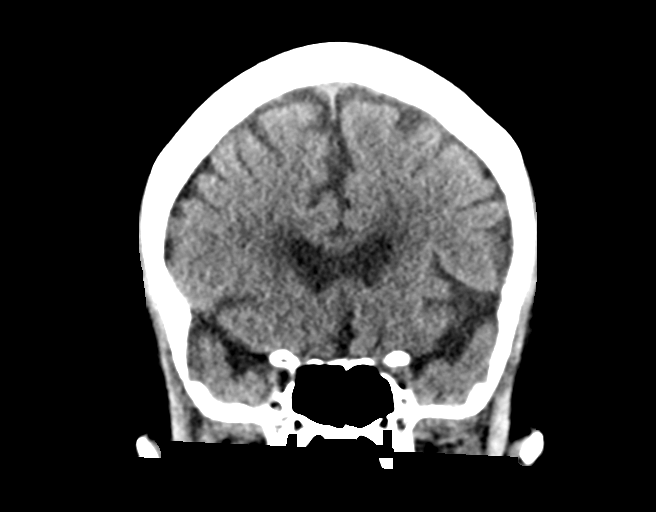
[im 30/67  brain]
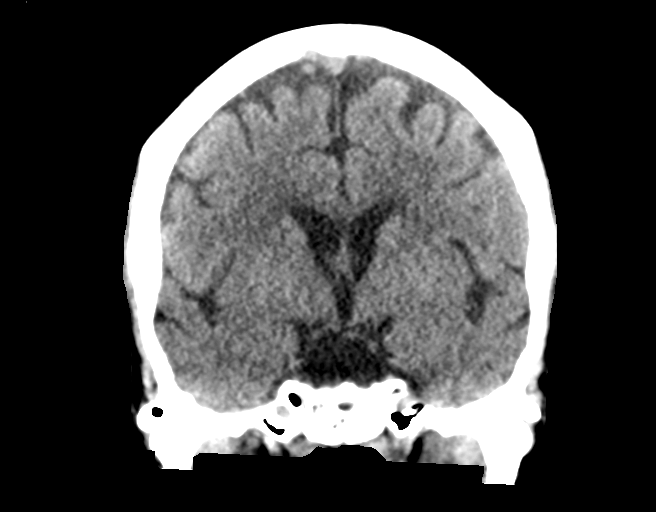
[im 37/67  brain]
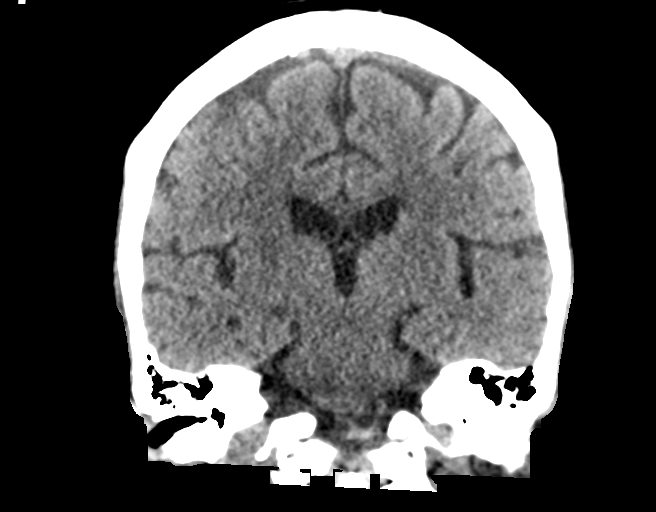

[Series 6: head without sag · sagittal · non-contrast · 0.33mm/px · 3 of 52 slices shown]
[im 18/52  brain]
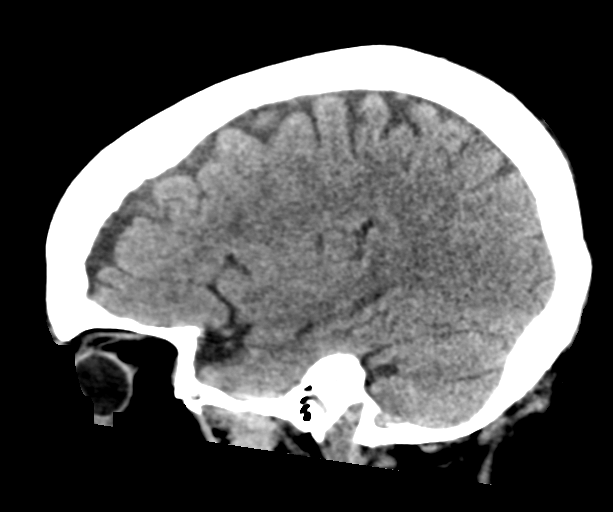
[im 26/52  brain]
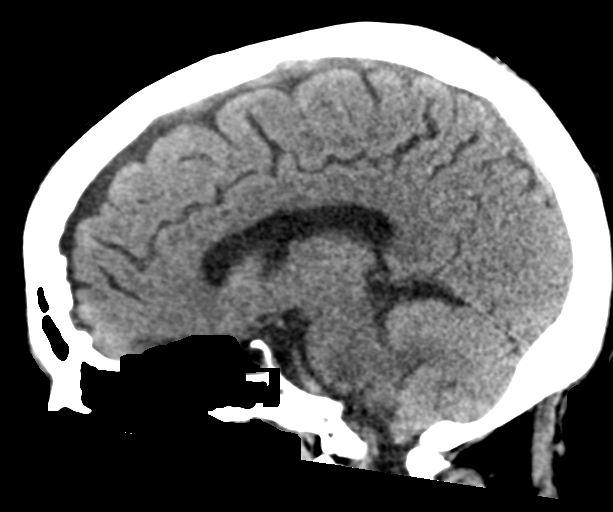
[im 35/52  brain]
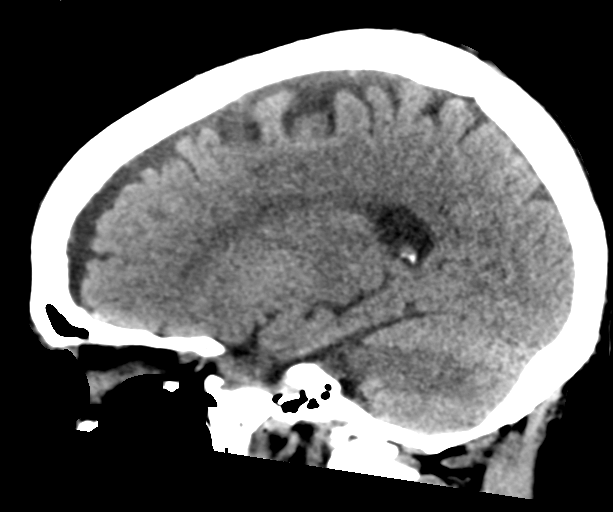

[16 of 47 positions shown; findings below may reference images not displayed]

FINDINGS: Brain: No evidence of acute infarction, hemorrhage, hydrocephalus,
extra-axial collection or mass lesion/mass effect.

Subcortical white matter and periventricular small vessel ischemic
changes.

Vascular: No hyperdense vessel or unexpected calcification.

Skull: Normal. Negative for fracture or focal lesion.

Sinuses/Orbits: Partial opacification of the left mastoid air cells.
Visualized paranasal sinuses are clear.

Other: None.
IMPRESSION: No evidence of acute intracranial abnormality.

Small vessel ischemic changes.

## 2023-04-27 IMAGING — DX DG ABD PORTABLE 1V
1 series · 1 of 1 positions shown · non-contrast
Comparison: None.

CLINICAL DATA: S/p NG feeding tube placement.

EXAM:
PORTABLE ABDOMEN - 1 VIEW

[abdomen]
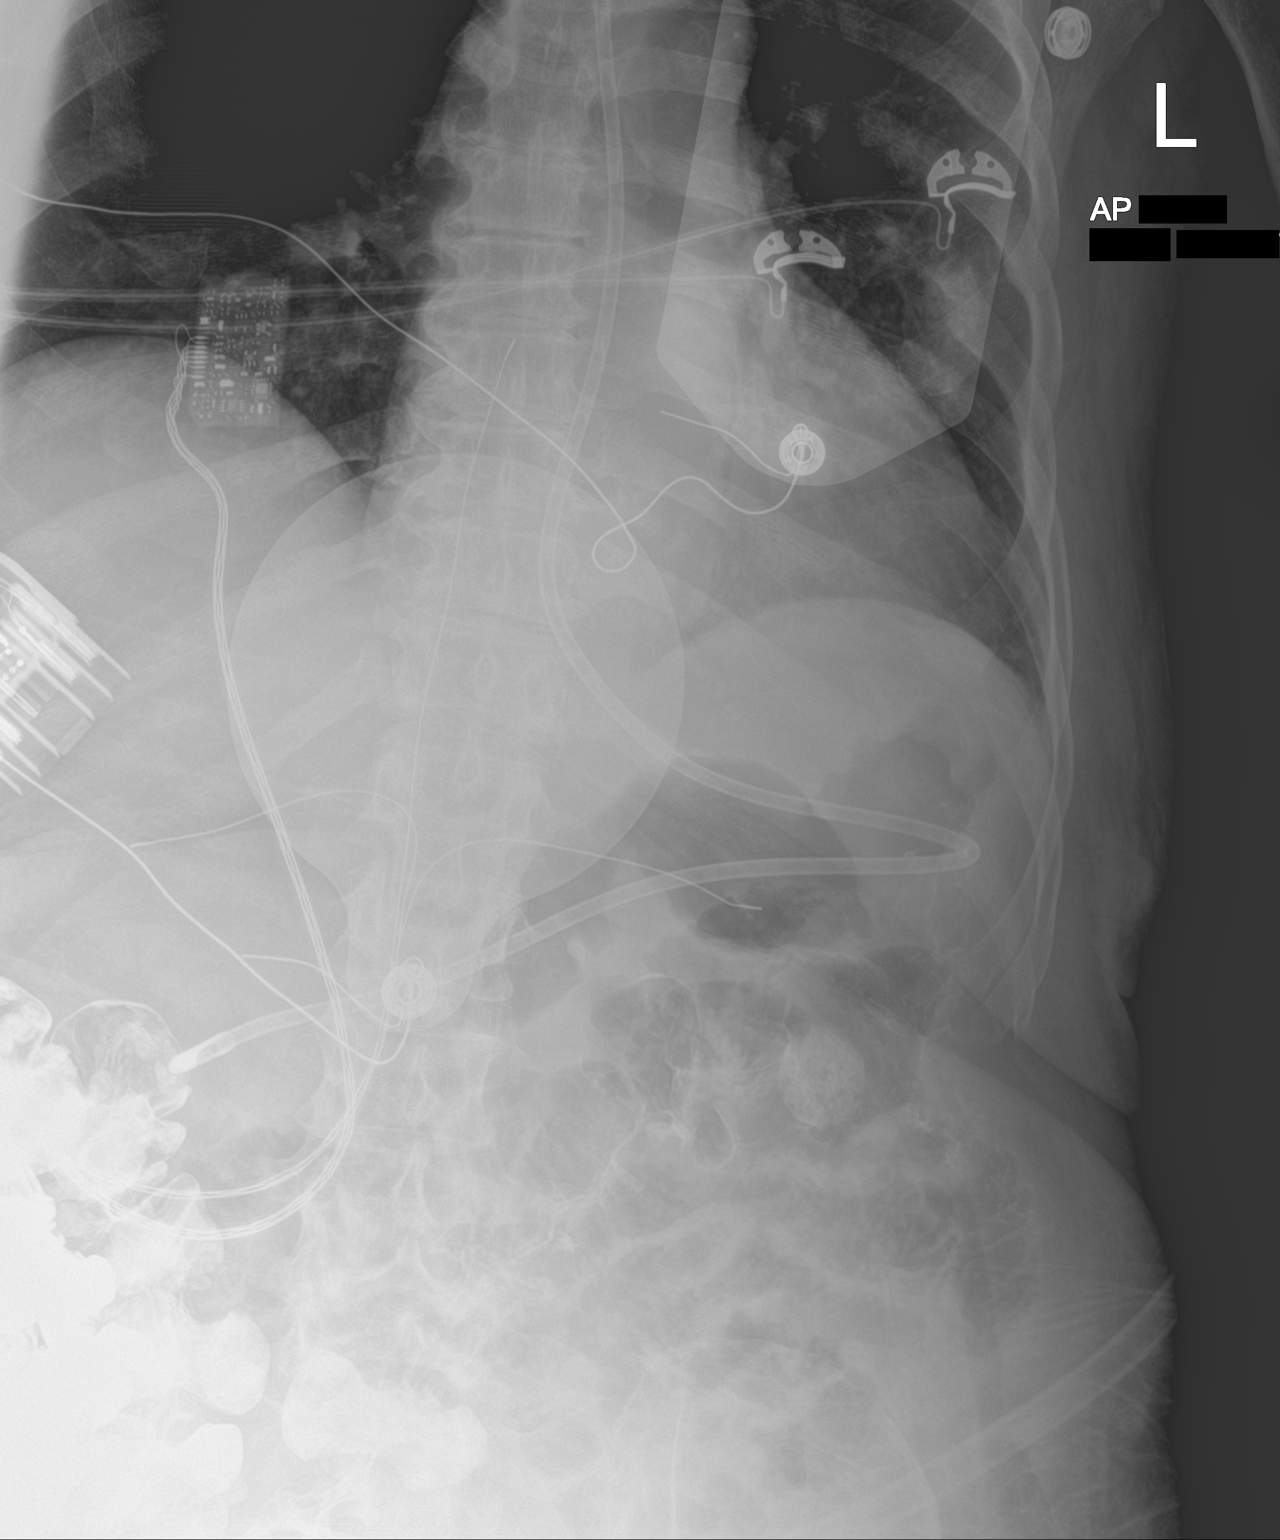

[1 of 1 positions shown; findings below may reference images not displayed]

FINDINGS: Weighted tip feeding tube appears appropriately positioned within
the stomach with tip directed towards the duodenal bulb. Visualized
bowel gas pattern is nonobstructive.
IMPRESSION: Feeding tube appears appropriately positioned within the stomach
with tip directed towards the duodenal bulb.

## 2023-05-01 ENCOUNTER — Encounter (HOSPITAL_COMMUNITY): Payer: Self-pay

## 2023-05-01 ENCOUNTER — Other Ambulatory Visit (HOSPITAL_COMMUNITY): Payer: Self-pay

## 2023-05-02 ENCOUNTER — Encounter (HOSPITAL_COMMUNITY): Payer: Self-pay

## 2023-05-02 ENCOUNTER — Other Ambulatory Visit (HOSPITAL_COMMUNITY): Payer: Self-pay

## 2023-05-02 ENCOUNTER — Other Ambulatory Visit: Payer: Self-pay

## 2023-05-10 ENCOUNTER — Other Ambulatory Visit (HOSPITAL_COMMUNITY): Payer: Self-pay

## 2023-05-12 ENCOUNTER — Other Ambulatory Visit (HOSPITAL_COMMUNITY): Payer: Self-pay

## 2023-05-16 ENCOUNTER — Other Ambulatory Visit (HOSPITAL_COMMUNITY): Payer: Self-pay

## 2023-05-31 ENCOUNTER — Encounter (HOSPITAL_COMMUNITY): Payer: Self-pay

## 2023-05-31 ENCOUNTER — Emergency Department (HOSPITAL_COMMUNITY): Admission: EM | Admit: 2023-05-31 | Discharge: 2023-06-04 | Disposition: A | Discharge status: Home / Self Care

## 2023-05-31 ENCOUNTER — Other Ambulatory Visit: Payer: Self-pay

## 2023-05-31 DIAGNOSIS — I2510 Atherosclerotic heart disease of native coronary artery without angina pectoris: Secondary | ICD-10-CM | POA: Insufficient documentation

## 2023-05-31 DIAGNOSIS — Z8673 Personal history of transient ischemic attack (TIA), and cerebral infarction without residual deficits: Secondary | ICD-10-CM | POA: Insufficient documentation

## 2023-05-31 DIAGNOSIS — J449 Chronic obstructive pulmonary disease, unspecified: Secondary | ICD-10-CM | POA: Insufficient documentation

## 2023-05-31 DIAGNOSIS — D649 Anemia, unspecified: Secondary | ICD-10-CM | POA: Diagnosis not present

## 2023-05-31 DIAGNOSIS — Z91010 Allergy to peanuts: Secondary | ICD-10-CM | POA: Diagnosis not present

## 2023-05-31 DIAGNOSIS — I5032 Chronic diastolic (congestive) heart failure: Secondary | ICD-10-CM | POA: Diagnosis not present

## 2023-05-31 DIAGNOSIS — F200 Paranoid schizophrenia: Secondary | ICD-10-CM | POA: Diagnosis present

## 2023-05-31 DIAGNOSIS — I132 Hypertensive heart and chronic kidney disease with heart failure and with stage 5 chronic kidney disease, or end stage renal disease: Secondary | ICD-10-CM | POA: Insufficient documentation

## 2023-05-31 DIAGNOSIS — Z79899 Other long term (current) drug therapy: Secondary | ICD-10-CM | POA: Insufficient documentation

## 2023-05-31 DIAGNOSIS — Z992 Dependence on renal dialysis: Secondary | ICD-10-CM | POA: Insufficient documentation

## 2023-05-31 DIAGNOSIS — E1122 Type 2 diabetes mellitus with diabetic chronic kidney disease: Secondary | ICD-10-CM | POA: Insufficient documentation

## 2023-05-31 DIAGNOSIS — N186 End stage renal disease: Secondary | ICD-10-CM | POA: Diagnosis not present

## 2023-05-31 DIAGNOSIS — R45850 Homicidal ideations: Secondary | ICD-10-CM | POA: Diagnosis not present

## 2023-05-31 DIAGNOSIS — Z7951 Long term (current) use of inhaled steroids: Secondary | ICD-10-CM | POA: Diagnosis not present

## 2023-05-31 DIAGNOSIS — R300 Dysuria: Secondary | ICD-10-CM | POA: Diagnosis present

## 2023-05-31 NOTE — ED Provider Notes (Signed)
 [facility] Emergency Department Provider Note MRN:  [id number]  Arrival date & time: 06/01/23     Chief Complaint   Dysuria, Wound Check, and Mental Health Problem   History of Present Illness   Tiffany [name] is a 63 y.o. year-old female presents to the ED with chief complaint of multiple complaints.  Hx of ESRD on HD M/W/F.  She complains of dysuria for the past several days.  She states that she has associated hesitancy and frequency.  [name] concerned she has an infection.  She denies fever, nuasea, or vomiting. She has hx of bed sores on her bottom.  [name] wheel chair bound.   Psychiatric problem.  She states that she feels anxious, paranoid, and fearful.  She states that she doesn't feel safe.  She reports homicidal thoughts and has been reportedly more angry and aggressive for months.  History provided by patient.   Review of Systems  Pertinent positive and negative review of systems noted in HPI.    Physical Exam   Vitals:   06/01/23 0115 06/01/23 0244  BP: 128/77   Pulse: 78   Resp: (!) 22   Temp:  98.2 F (36.8 C)  SpO2: 97%     CONSTITUTIONAL:  non toxic-appearing, NAD NEURO:  Alert and oriented x 3, CN 3-12 grossly intact EYES:  eyes equal and reactive ENT/NECK:  Supple, no stridor  CARDIO:  normal rate, regular rhythm, appears well-perfused  PULM:  No respiratory distress, CTAB GI/GU:  non-distended, no focal abdominal tenderness MSK/SPINE:  No gross deformities, no edema, moves all extremities  SKIN:  no rash, atraumatic   *Additional and/or pertinent findings included in MDM below  Diagnostic and Interventional Summary    EKG Interpretation Date/Time:    Ventricular Rate:    PR Interval:    QRS Duration:    QT Interval:    QTC Calculation:   R Axis:      Text Interpretation:         Labs Reviewed  COMPREHENSIVE METABOLIC PANEL WITH GFR - Abnormal; Notable for the following components:      Result Value    Potassium 3.0 (*)    Chloride 95 (*)    Creatinine, Ser 5.43 (*)    GFR, Estimated 8 (*)    All other components within normal limits  CBC WITH DIFFERENTIAL/PLATELET - Abnormal; Notable for the following components:   RBC 3.76 (*)    Hemoglobin 11.8 (*)    MCV 100.3 (*)    RDW 16.7 (*)    All other components within normal limits  URINALYSIS, ROUTINE W REFLEX MICROSCOPIC - Abnormal; Notable for the following components:   APPearance HAZY (*)    Hgb urine dipstick SMALL (*)    Protein, ur 30 (*)    Leukocytes,Ua MODERATE (*)    Bacteria, UA MANY (*)    All other components within normal limits  URINE CULTURE  ETHANOL  RAPID URINE DRUG SCREEN, HOSP PERFORMED  POC OCCULT BLOOD, ED    No orders to display    Medications  albuterol  (VENTOLIN  HFA) 108 (90 Base) MCG/ACT inhaler 2 puff (has no administration in time range)  amLODipine  (NORVASC ) tablet 10 mg (has no administration in time range)  atorvastatin  (LIPITOR ) tablet 20 mg (has no administration in time range)  calcitRIOL  (ROCALTROL ) capsule 0.5 mcg (has no administration in time range)  carvedilol  (COREG ) tablet 12.5 mg (has no administration in time range)  DULoxetine  (CYMBALTA ) DR capsule 30  mg (has no administration in time range)  escitalopram  (LEXAPRO ) tablet 10 mg (has no administration in time range)  ezetimibe  (ZETIA ) tablet 10 mg (has no administration in time range)  ferrous sulfate  tablet 325 mg (has no administration in time range)  lamoTRIgine  (LAMICTAL ) tablet 100 mg (has no administration in time range)  metolazone  (ZAROXOLYN ) tablet 2.5 mg (has no administration in time range)  mirtazapine  (REMERON ) tablet 7.5 mg (has no administration in time range)  pantoprazole  (PROTONIX ) EC tablet 40 mg (has no administration in time range)  prazosin  (MINIPRESS ) capsule 2 mg (has no administration in time range)  pregabalin  (LYRICA ) capsule 100 mg (has no administration in time range)  QUEtiapine  (SEROQUEL ) tablet 50 mg  (has no administration in time range)  torsemide  (DEMADEX ) tablet 20 mg (has no administration in time range)  cefTRIAXone  (ROCEPHIN ) 1 g in sodium chloride  0.9 % 100 mL IVPB (0 g Intravenous Stopped 06/01/23 0321)     Procedures  /  Critical Care Procedures  ED Course and Medical Decision Making  I have reviewed the triage vital signs, the nursing notes, and pertinent available records from the EMR.  Social Determinants Affecting Complexity of Care: Patient has no clinically significant social determinants affecting this chief complaint..   ED Course: Clinical Course as of 06/01/23 0526  Fri Jun 01, 2023  0326 Urinalysis, Routine w reflex microscopic -Urine, Clean Catch(!) Urinalysis is worrisome for infection.  Patient has had dysuria.  Will treat with Rocephin . [RB]  0326 Comprehensive metabolic panel(!) Potassium is a little low at 3.0, patient is on dialysis, next dialysis is scheduled for today. [RB]  0328 CBC with Differential(!) No marked leukocytosis, mild anemia, about baseline [RB]  0328 Rapid urine drug screen (hospital performed) Normal UDS [RB]    Clinical Course User Index [RB] Sherel Dikes, PA-C    Medical Decision Making Amount and/or Complexity of Data Reviewed Labs: ordered. Decision-making details documented in ED Course.  Risk OTC drugs. Prescription drug management.         Consultants: TTS consult pending for homicidal thoughts. I consulted with nephrology, Dr. Zelda Hickman, who will have patient added to the list and suspects that dialysis team will be able to get her at some point today.   Treatment and Plan: Dispo pending TTS consult.    Final Clinical Impressions(s) / ED Diagnoses     ICD-10-CM   1. Dysuria  R30.0     2. Homicidal thoughts  R45.850       ED Discharge Orders          Ordered    cephALEXin  (KEFLEX ) 500 MG capsule  2 times daily        06/01/23 0526              Discharge Instructions Discussed with and  Provided to Patient:   Discharge Instructions   None      Sherel Dikes, PA-C 06/01/23 2440    Lindle Rhea, MD 06/02/23 [phone]

## 2023-05-31 NOTE — ED Triage Notes (Signed)
 Patient BIB [facility] from home with painful urination x 3 days, bed sores on bottom due to being wheelchair bound, possible vaginal or rectal bleeding x 3 days.   Dialysis: M/W/F  Patient reports she called EMS for mental health, reports being specious of everyone, angry, and hostile x months. Reports homicidal thoughts against others.

## 2023-06-01 ENCOUNTER — Encounter (HOSPITAL_COMMUNITY): Payer: Self-pay | Admitting: Nephrology

## 2023-06-01 DIAGNOSIS — R45850 Homicidal ideations: Secondary | ICD-10-CM

## 2023-06-01 DIAGNOSIS — F200 Paranoid schizophrenia: Secondary | ICD-10-CM

## 2023-06-01 LAB — URINALYSIS, ROUTINE W REFLEX MICROSCOPIC
Bilirubin Urine: NEGATIVE
Glucose, UA: NEGATIVE mg/dL
Ketones, ur: NEGATIVE mg/dL
Nitrite: NEGATIVE
Protein, ur: 30 mg/dL — AB
Specific Gravity, Urine: 1.01 (ref 1.005–1.030)
WBC, UA: >50 WBC/hpf (ref 0–5)
pH: 5 (ref 5.0–8.0)

## 2023-06-01 LAB — COMPREHENSIVE METABOLIC PANEL WITH GFR
ALT: 14 U/L (ref 0–44)
AST: 25 U/L (ref 15–41)
Albumin: 3.5 g/dL (ref 3.5–5.0)
Alkaline Phosphatase: 93 U/L (ref 38–126)
Anion gap: 15 (ref 5–15)
BUN: 19 mg/dL (ref 8–23)
CO2: 26 mmol/L (ref 22–32)
Calcium: 9.3 mg/dL (ref 8.9–10.3)
Chloride: 95 mmol/L — ABNORMAL LOW (ref 98–111)
Creatinine, Ser: 5.43 mg/dL — ABNORMAL HIGH (ref 0.44–1.00)
GFR, Estimated: 8 mL/min — ABNORMAL LOW (ref >60)
Glucose, Bld: 79 mg/dL (ref 70–99)
Potassium: 3 mmol/L — ABNORMAL LOW (ref 3.5–5.1)
Sodium: 136 mmol/L (ref 135–145)
Total Bilirubin: 0.6 mg/dL (ref 0.0–1.2)
Total Protein: 6.7 g/dL (ref 6.5–8.1)

## 2023-06-01 LAB — CBC WITH DIFFERENTIAL/PLATELET
Abs Immature Granulocytes: 0.01 10*3/uL (ref 0.00–0.07)
Basophils Absolute: 0.1 10*3/uL (ref 0.0–0.1)
Basophils Relative: 1 %
Eosinophils Absolute: 0.2 10*3/uL (ref 0.0–0.5)
Eosinophils Relative: 3 %
HCT: 37.7 % (ref 36.0–46.0)
Hemoglobin: 11.8 g/dL — ABNORMAL LOW (ref 12.0–15.0)
Immature Granulocytes: 0 %
Lymphocytes Relative: 30 %
Lymphs Abs: 1.9 10*3/uL (ref 0.7–4.0)
MCH: 31.4 pg (ref 26.0–34.0)
MCHC: 31.3 g/dL (ref 30.0–36.0)
MCV: 100.3 fL — ABNORMAL HIGH (ref 80.0–100.0)
Monocytes Absolute: 0.8 10*3/uL (ref 0.1–1.0)
Monocytes Relative: 13 %
Neutro Abs: 3.6 10*3/uL (ref 1.7–7.7)
Neutrophils Relative %: 53 %
Platelets: 277 10*3/uL (ref 150–400)
RBC: 3.76 MIL/uL — ABNORMAL LOW (ref 3.87–5.11)
RDW: 16.7 % — ABNORMAL HIGH (ref 11.5–15.5)
WBC: 6.6 10*3/uL (ref 4.0–10.5)
nRBC: 0 % (ref 0.0–0.2)

## 2023-06-01 LAB — ETHANOL: Alcohol, Ethyl (B): <15 mg/dL (ref <15)

## 2023-06-01 LAB — RAPID URINE DRUG SCREEN, HOSP PERFORMED
Amphetamines: NOT DETECTED
Barbiturates: NOT DETECTED
Benzodiazepines: NOT DETECTED
Cocaine: NOT DETECTED
Opiates: NOT DETECTED
Tetrahydrocannabinol: NOT DETECTED

## 2023-06-01 MED ORDER — AMLODIPINE BESYLATE 5 MG PO TABS
10.0000 mg | ORAL_TABLET | Freq: Every day | ORAL | Status: DC
  Administered 2023-06-02 – 2023-06-04 (×3): 10 mg via ORAL
  Filled 2023-06-01 (×4): qty 2

## 2023-06-01 MED ORDER — ALBUTEROL SULFATE HFA 108 (90 BASE) MCG/ACT IN AERS: 2.0000 | INHALATION_SPRAY | Freq: Four times a day (QID) | RESPIRATORY_TRACT | Status: DC | PRN

## 2023-06-01 MED ORDER — LAMOTRIGINE 100 MG PO TABS
100.0000 mg | ORAL_TABLET | Freq: Every day | ORAL | Status: DC
Start: 2023-06-01 — End: 2023-06-05
  Administered 2023-06-01 – 2023-06-04 (×4): 100 mg via ORAL
  Filled 2023-06-01 (×2): qty 4
  Filled 2023-06-01 (×2): qty 1

## 2023-06-01 MED ORDER — CEPHALEXIN 250 MG PO CAPS
500.0000 mg | ORAL_CAPSULE | Freq: Two times a day (BID) | ORAL | Status: DC
  Administered 2023-06-01 – 2023-06-02 (×2): 500 mg via ORAL
  Filled 2023-06-01 (×2): qty 2

## 2023-06-01 MED ORDER — TORSEMIDE 20 MG PO TABS
20.0000 mg | ORAL_TABLET | Freq: Two times a day (BID) | ORAL | Status: DC
  Administered 2023-06-01 – 2023-06-04 (×8): 20 mg via ORAL
  Filled 2023-06-01 (×8): qty 1

## 2023-06-01 MED ORDER — FERROUS SULFATE 325 (65 FE) MG PO TABS
325.0000 mg | ORAL_TABLET | Freq: Every day | ORAL | Status: DC
Start: 2023-06-01 — End: 2023-06-05
  Administered 2023-06-01 – 2023-06-04 (×4): 325 mg via ORAL
  Filled 2023-06-01 (×4): qty 1

## 2023-06-01 MED ORDER — PREGABALIN 50 MG PO CAPS
100.0000 mg | ORAL_CAPSULE | Freq: Three times a day (TID) | ORAL | Status: DC
  Administered 2023-06-01 – 2023-06-03 (×8): 100 mg via ORAL
  Filled 2023-06-01 (×2): qty 2
  Filled 2023-06-01: qty 4
  Filled 2023-06-01 (×3): qty 2
  Filled 2023-06-01: qty 4
  Filled 2023-06-01 (×2): qty 2

## 2023-06-01 MED ORDER — ATORVASTATIN CALCIUM 10 MG PO TABS
20.0000 mg | ORAL_TABLET | Freq: Every day | ORAL | Status: DC
  Administered 2023-06-01 – 2023-06-04 (×4): 20 mg via ORAL
  Filled 2023-06-01 (×4): qty 2

## 2023-06-01 MED ORDER — PANTOPRAZOLE SODIUM 40 MG PO TBEC
40.0000 mg | DELAYED_RELEASE_TABLET | Freq: Every day | ORAL | Status: DC
  Administered 2023-06-01 – 2023-06-04 (×4): 40 mg via ORAL
  Filled 2023-06-01 (×4): qty 1

## 2023-06-01 MED ORDER — CHLORHEXIDINE GLUCONATE CLOTH 2 % EX PADS
6.0000 | MEDICATED_PAD | Freq: Every day | CUTANEOUS | Status: DC
Start: 2023-06-02 — End: 2023-06-05

## 2023-06-01 MED ORDER — CALCITRIOL 0.5 MCG PO CAPS
0.5000 ug | ORAL_CAPSULE | Freq: Every day | ORAL | Status: DC
  Administered 2023-06-01 – 2023-06-04 (×4): 0.5 ug via ORAL
  Filled 2023-06-01 (×4): qty 1

## 2023-06-01 MED ORDER — MIRTAZAPINE 15 MG PO TABS
7.5000 mg | ORAL_TABLET | Freq: Every day | ORAL | Status: DC
  Administered 2023-06-01 – 2023-06-03 (×3): 7.5 mg via ORAL
  Filled 2023-06-01 (×4): qty 1

## 2023-06-01 MED ORDER — QUETIAPINE FUMARATE 50 MG PO TABS
50.0000 mg | ORAL_TABLET | Freq: Two times a day (BID) | ORAL | Status: DC
  Administered 2023-06-01 – 2023-06-03 (×6): 50 mg via ORAL
  Filled 2023-06-01 (×2): qty 2
  Filled 2023-06-01 (×5): qty 1

## 2023-06-01 MED ORDER — METOLAZONE 2.5 MG PO TABS
2.5000 mg | ORAL_TABLET | ORAL | Status: DC
  Administered 2023-06-01 – 2023-06-04 (×2): 2.5 mg via ORAL
  Filled 2023-06-01 (×2): qty 1

## 2023-06-01 MED ORDER — ESCITALOPRAM OXALATE 10 MG PO TABS
10.0000 mg | ORAL_TABLET | Freq: Every day | ORAL | Status: DC
  Administered 2023-06-01 – 2023-06-03 (×3): 10 mg via ORAL
  Filled 2023-06-01 (×3): qty 1

## 2023-06-01 MED ORDER — EZETIMIBE 10 MG PO TABS
10.0000 mg | ORAL_TABLET | Freq: Every day | ORAL | Status: DC
Start: 2023-06-01 — End: 2023-06-05
  Administered 2023-06-01 – 2023-06-04 (×4): 10 mg via ORAL
  Filled 2023-06-01 (×5): qty 1

## 2023-06-01 MED ORDER — CARVEDILOL 12.5 MG PO TABS
12.5000 mg | ORAL_TABLET | Freq: Two times a day (BID) | ORAL | Status: DC
Start: 2023-06-01 — End: 2023-06-05
  Administered 2023-06-01 – 2023-06-04 (×7): 12.5 mg via ORAL
  Filled 2023-06-01 (×8): qty 1

## 2023-06-01 MED ORDER — CEPHALEXIN 500 MG PO CAPS: 500.0000 mg | ORAL_CAPSULE | Freq: Two times a day (BID) | ORAL | 0 refills | Status: DC

## 2023-06-01 MED ORDER — SODIUM CHLORIDE 0.9 % IV SOLN
1.0000 g | Freq: Once | INTRAVENOUS | Status: AC
  Administered 2023-06-01: 1 g via INTRAVENOUS
  Filled 2023-06-01: qty 10

## 2023-06-01 MED ORDER — PRAZOSIN HCL 2 MG PO CAPS
2.0000 mg | ORAL_CAPSULE | Freq: Every day | ORAL | Status: DC
  Administered 2023-06-02 – 2023-06-03 (×2): 2 mg via ORAL
  Filled 2023-06-01 (×4): qty 1

## 2023-06-01 MED ORDER — DULOXETINE HCL 30 MG PO CPEP
30.0000 mg | ORAL_CAPSULE | Freq: Every day | ORAL | Status: DC
  Administered 2023-06-01 – 2023-06-04 (×4): 30 mg via ORAL
  Filled 2023-06-01 (×4): qty 1

## 2023-06-01 NOTE — Progress Notes (Signed)
 Physical Therapy Quick Note  PT has completed initial evaluation.    Overall, patient at contact guard assistance level.   PT Follow up recommended: Patient may benefit from PT services after inpatient psych admission Equipment recommended:  Patient will need a manual wheelchair to mobilize with at inpatient psych. She does own a manual wheelchair which is at her home. Complete evaluation note to follow.     Rexie Catena, PT, DPT Acute Rehabilitation Office [phone]

## 2023-06-01 NOTE — Progress Notes (Signed)
 Informed of ESRD patient in ER. Presents for behavioral issues/homicidal ideation, has yet to have psych eval therefore dispo unknown at this time. Dialyzes MWF. To be seen later today to determine timing of dialysis. No urgent indications based on labs and the fact that she is on RA per flowsheet.  Cristi Donalds, MD [facility]

## 2023-06-01 NOTE — Progress Notes (Signed)
 [name] spoke with Arlette Benders at [facility] to make referral for ACTT services. [name] sent secure email with clinicals attached for review.   Patient is currently being recommended for inpatient psychiatric treatment.  Shepard Dicker, MSW, LCSW Transitions of Care  Clinical Social Worker II [phone]

## 2023-06-01 NOTE — Consult Note (Signed)
 [facility] Psychiatric Consult Initial  Patient Name: .Tiffany [name]  MRN: [id number]  DOB: [birth year]  Consult Order details:  Orders (From admission, onward)     Start     Ordered   06/01/23 0459  CONSULT TO CALL ACT TEAM       Ordering Provider: Sherel Dikes, PA-C  Provider:  (Not yet assigned)  Question:  Reason for Consult?  Answer:  Psych consult   06/01/23 0458             Mode of Visit: In person    Psychiatry Consult Evaluation  Service Date: Jun 01, 2023 LOS:  LOS: 0 days  Chief Complaint "I feel helpless"  Primary Psychiatric Diagnoses  Schizophrenia, paranoid 2.  Homicidal Ideation   Assessment  Tiffany [name] is a 63 y.o. female admitted: Presented to the EDfor 05/31/2023  9:22 PM brought in by EMS from home reporting painful urination, paranoia, hallucinations and homicidal ideations. She carries the psychiatric diagnoses of paranoid personality disorder, binge eating disorder, cocaine abuse, bipolar affective disorder, depressive disorder, schizoaffective disorder, PTSD, anxiety and schizophrenia and has a past medical history of CAD, HTN, Heart failure, OSA, COPD, asthma, GERD, Diabetes type 1, MI, osteoarthritis, morbid obesity, gout, chronic kidney diseases stage 5 and ESRD on dialysis.   Her current presentation of sadness, anger, hallucinations and homicidal ideations is most consistent with decompensated paranoid schizophrenia. She meets criteria for inpatient psychiatric hospitalization based on being a danger to others and acute psychosis.  Current outpatient psychotropic medications include saphris , cymbalta , lexapro , atarax , lamictal , mirtazipine and seroquel  and historically she has had a positive response to these medications. She was not compliant with medications prior to admission as evidenced by patient report that she ran out of medications. On initial examination, patient is tearful, angry and hallucinating. Please see plan below for detailed  recommendations.   Diagnoses:  Active Hospital problems: Principal Problem:   Schizophrenia, paranoid (HCC) Active Problems:   Homicidal ideation    Plan   ## Psychiatric Medication Recommendations:  --cymbalta  30mg  PO Q day --lexapro  10mg  PO Q day --lamictal  100mg  PO Q day --mirtazapine  7.5mg  PO Q HS --prazosin  2mg  PO Q HS --seroquel  50mg  PO BID  ## Medical Decision Making Capacity: Not specifically addressed in this encounter  ## Further Work-up:  -- most recent EKG on 03/12/23 had QtC of 469; updated EKG pending -- Pertinent labwork reviewed earlier this admission includes: CBC, CMP, UA, alcohol and UDS   ## Disposition:-- We recommend inpatient psychiatric hospitalization when medically cleared. Patient is under voluntary admission status at this time; please IVC if attempts to leave hospital.  ## Behavioral / Environmental: -Utilize compassion and acknowledge the patient's experiences while setting clear and realistic expectations for care.    ## Safety and Observation Level:  - Based on my clinical evaluation, I estimate the patient to be at low risk of self harm in the current setting. - At this time, we recommend  routine. This decision is based on my review of the chart including patient's history and current presentation, interview of the patient, mental status examination, and consideration of suicide risk including evaluating suicidal ideation, plan, intent, suicidal or self-harm behaviors, risk factors, and protective factors. This judgment is based on our ability to directly address suicide risk, implement suicide prevention strategies, and develop a safety plan while the patient is in the clinical setting. Please contact our team if there is a concern that risk level has changed.  CSSR Risk Category:C-SSRS  RISK CATEGORY: Low Risk  Suicide Risk Assessment: Patient has following modifiable risk factors for suicide: under treated depression  and medication  noncompliance, which we are addressing by recommending inpatient psychiatric hospitalization. Patient has following non-modifiable or demographic risk factors for suicide: psychiatric hospitalization Patient has the following protective factors against suicide: Supportive family  Thank you for this consult request. Recommendations have been communicated to the primary team.  We will continue to follow at this time.   Jeraline Moment, NP       History of Present Illness  Relevant Aspects of Hospital ED Course:  Admitted on 05/31/2023 brought in by EMS from home reporting painful urination, paranoia, hallucinations and homicidal ideations. She carries the psychiatric diagnoses of paranoid personality disorder, binge eating disorder, cocaine abuse, bipolar affective disorder, depressive disorder, schizoaffective disorder, PTSD, anxiety and schizophrenia and has a past medical history of CAD, HTN, Heart failure, OSA, COPD, asthma, GERD, Diabetes type 1, MI, osteoarthritis, morbid obesity, gout, chronic kidney diseases stage 5 and ESRD on dialysis.   Patient Report:  Tiffany [name], is seen face to face by this provider, consulted with Dr. Deborah Falling; and chart reviewed on 06/01/23.  On evaluation Tiffany [name] reports she is feeling helpless, sad, angry, hurt and betrayed.  She states she is hostile toward her mother because her mother doesn't see the hallucinations and patient believes "she doesn't respect" the patient.  Patient also reports she is unsure what is a hallucination and what is real.  Patient gives the example that she saw a lizard in the home "wriggling under the couch" and she killed it.  She says her mother said it wasn't there and she didn't see a lizard.  She also says she saw her mother sweep the lizard into the home at the front door.  Patient also believes her mother stole her money for two years.  The perceived theft, disrespect and exchange over the lizard is the source of patient's  hostility and homicidal ideation towards patient's mother.    Patient states she has been off her medication due to running out of refills and not having a psychiatric provider or therapist for 2 years.  Patient states she would like to restart her medications and get help with establishing outpatient psychiatry and therapy.    During evaluation Tiffany [name] is laying on a stretcher in no acute distress.  [name] alert & oriented x 3, cooperative and attentive for this assessment.  Her mood is depressed with congruent affect.  She has normal speech, and behavior.  Objectively there is evidence of psychosis and delusional thinking. Pt does not appear to be responding to internal or external stimuli.  Patient is able to converse coherently, goal directed thoughts, no distractibility, or pre-occupation.  She  denies suicidal/self-harm ideation and endorses homicidal ideation and psychosis, and paranoia.  Patient answered questions appropriately.    Psych ROS:  Depression: endorses Anxiety:  endorses Mania (lifetime and current): unknown Psychosis: (lifetime and current): endorses   Review of Systems  Respiratory:  Positive for shortness of breath.   Musculoskeletal:        Utilizes a wheelchair  Skin:        Wounds on buttocks  Psychiatric/Behavioral:  Positive for depression and hallucinations.   All other systems reviewed and are negative.    Psychiatric and Social History  Psychiatric History:  Information collected from patient and chart review  Prev Dx/Sx: paranoid personality disorder, binge eating disorder, cocaine abuse, bipolar  affective disorder, depressive disorder, schizoaffective disorder, PTSD, anxiety and schizophrenia Current Psych Provider: none for 2 years per patient  Home Meds (current): saphris , cymbalta , lexapro , atarax , lamictal , mirtazipine and seroquel  Previous Med Trials: unknown Therapy: none for 2 years  Prior Psych Hospitalization: yes  Prior Self Harm:  endorses Prior Violence: denies  Family Psych History: sister - schizophrenia, bipolar disorder and depression; sister - anxiety and depression; brother - depression; mother - depression Family Hx suicide: none noted  Social History:  Developmental Hx: wnl Educational Hx: some college Occupational Hx: disability Legal Hx: denies Living Situation: states she has her own place Spiritual Hx: christian Access to weapons/lethal means: denies   Substance History Alcohol: denies  Tobacco: denies Illicit drugs: denies current; previous cocaine abuse Prescription drug abuse: denies  Rehab hx: unknown  Exam Findings  Physical Exam:  Vital Signs:  Temp:  [98.1 F (36.7 C)-98.6 F (37 C)] 98.1 F (36.7 C) (05/16 0747) Pulse Rate:  [73-84] 81 (05/16 0747) Resp:  [12-23] 18 (05/16 0747) BP: (128-149)/(76-102) 136/77 (05/16 0747) SpO2:  [94 %-100 %] 95 % (05/16 0747) Weight:  [81.6 kg] 81.6 kg (05/15 2131) Blood pressure 136/77, pulse 81, temperature 98.1 F (36.7 C), temperature source Oral, resp. rate 18, height 5\' 3"  (1.6 m), weight 81.6 kg, SpO2 95%. Body mass index is 31.89 kg/m.  Physical Exam Vitals and nursing note reviewed.  Constitutional:      Appearance: [name] obese.  Pulmonary:     Effort: Pulmonary effort is normal.  Skin:    General: Skin is dry.     Comments: Wounds on buttocks  Neurological:     Mental Status: [name] alert and oriented to person, place, and time.     Mental Status Exam: General Appearance: Casual  Orientation:  Full (Time, Place, and Person)  Memory:  Immediate;   Fair Recent;   Fair Remote;   Fair  Concentration:  Concentration: Fair  Recall:  Fair  Attention  Good  Eye Contact:  Good  Speech:  slightly slurred  Language:  Fair  Volume:  Normal  Mood: depressed  Affect:  Congruent  Thought Process:  Linear  Thought Content:  Delusions and Hallucinations: Auditory Visual  Suicidal Thoughts:  No  Homicidal Thoughts:  Yes.   without intent/plan  Judgement:  Impaired  Insight:  Shallow  Psychomotor Activity:  Decreased  Akathisia:  No  Fund of Knowledge:  Fair      Assets:  Health and safety inspector Housing Leisure Time  Cognition:  Impaired,  Mild  ADL's:  Impaired  AIMS (if indicated):        Other History   These have been pulled in through the EMR, reviewed, and updated if appropriate.  Family History:  The patient's family history includes Anxiety disorder in her sister; Bipolar disorder in her sister; Cancer in her father and mother; Depression in her brother, mother, sister, and sister; Heart failure in an other family member; Schizophrenia in her sister.  Medical History: Past Medical History:  Diagnosis Date   Abdominal pain    Acute encephalopathy 11/21/2017   Acute exacerbation of CHF (congestive heart failure) (HCC) 03/03/2022   Acute GI bleeding 03/29/2021   Acute kidney injury superimposed on chronic kidney disease (HCC) 02/18/2020   Acute metabolic encephalopathy 02/20/2020   Acute on chronic diastolic heart failure (HCC) 02/04/2022   Acute on chronic renal failure (HCC) 06/02/2022   Aggressive behavior    Agitation 11/22/2017   Anoxic brain injury (HCC) 09/08/2016  C. Arrest due to respiratory failure and COPD exacerbation   Anxiety    Arthritis    "all over" (04/10/2016)   Asthma 10/18/2010   Binge eating disorder    Blood loss anemia 04/24/2011   CAP (community acquired pneumonia) 06/22/2015   Cardiac arrest (HCC) 09/08/2016   PEA   Carotid artery stenosis    1-39% bilateral by dopplers 11/2016   Chronic diastolic (congestive) heart failure (HCC)    Chronic kidney disease, stage 3 (HCC)    Chronic kidney disease, stage 3b (HCC) 06/06/2021   Chronic pain syndrome 06/18/2012   Chronic post-traumatic stress disorder (PTSD) 05/27/2018   Chronic respiratory failure with hypoxia and hypercapnia (HCC) 06/22/2015   TRILOGY Vent >AVAPA-ES., Vt target 200-400, Max P 30 , PS  max 20 , PS min 6-10 , E Max 6, E Min 4, Rate Auto AVAPS Rate 2 (titrate for pt comfort) , bleed O2 at 5l/m continuous flow .    CKD (chronic kidney disease) 02/03/2022   Closed displaced fracture of fifth metacarpal bone 03/21/2018   Cocaine use disorder, severe, in sustained remission (HCC) 12/17/2015   Complication of anesthesia    decreased bp, decreased heart rate   COPD (chronic obstructive pulmonary disease) (HCC) 07/08/2014   Delusional disorder, persecutory type (HCC) 06/26/2021   Depression    Diabetic neuropathy (HCC) 04/24/2011   Difficulty with speech 01/24/2018   Disorder of nervous system    Drug abuse (HCC) 11/21/2017   Dyslipidemia 04/24/2011   E. coli UTI 02/20/2020   Elevated troponin 04/28/2012   Emphysema    Encephalopathy 11/21/2017   Essential hypertension 03/22/2016   Facet arthropathy, lumbar 06/12/2022   Fibula fracture 07/10/2016   Frequent falls 10/11/2017   GERD (gastroesophageal reflux disease)    GI bleed 03/30/2021   Gout 04/11/2017   Heart attack (HCC) 1980s   Hematochezia    History of blood transfusion 1994   "couldn't stop bleeding from my period"   History of drug abuse in remission (HCC) 11/28/2015   Quit in 2017   Hyperlipidemia 03/31/2021   Hyperlipidemia LDL goal <70    Hypocalcemia    Hypokalemia    Hypomagnesemia    Incontinence    Manic depression (HCC)    Morbid obesity (HCC) 10/18/2010   Obstructive sleep apnea 10/18/2010   On home oxygen  therapy    "6L; 24/7" (04/10/2016)   OSA on CPAP    "wear mask sometimes" (04/10/2016)   Painless rectal bleeding 12/31/2015   Paranoid (HCC)    "sometimes; I'm on RX for it" (04/10/2016)   Periumbilical abdominal pain    Prolonged Q-T interval on ECG    Rectal bleeding 12/31/2015   Recurrent syncope 07/09/2021   Rhabdomyolysis 06/16/2021   Schizoaffective disorder, bipolar type (HCC) 04/05/2018   Seasonal allergies    Seborrheic keratoses 12/31/2013   Seizures (HCC)    "don't know  what kind; last one was ~ 1 yr ago" (04/10/2016)   Sinus bradycardia    Skin ulcer of sacrum, limited to breakdown of skin (HCC) 03/08/2022   Spondylolisthesis at L3-L4 level 06/12/2022   Stroke (HCC) 1980s   denies residual on 04/10/2016   Thrush 09/19/2013   Type 2 diabetes mellitus (HCC) 10/18/2010    Surgical History: Past Surgical History:  Procedure Laterality Date   A/V FISTULAGRAM N/A 02/01/2023   Procedure: A/V Fistulagram;  Surgeon: Baron Border, MD;  Location: [facility] INVASIVE CV LAB;  Service: Cardiovascular;  Laterality: N/A;   AV FISTULA PLACEMENT  Left 12/21/2022   Procedure: INSERTION OF LEFT ARM ARTERIOVENOUS (AV) GORE-TEX GRAFT;  Surgeon: Carlene Che, MD;  Location: [facility] OR;  Service: Vascular;  Laterality: Left;   CESAREAN SECTION  1997   COLONOSCOPY WITH PROPOFOL  N/A 04/01/2021   Procedure: COLONOSCOPY WITH PROPOFOL ;  Surgeon: Alvis Jourdain, MD;  Location: [facility] ENDOSCOPY;  Service: Gastroenterology;  Laterality: N/A;  Rectal bleeding with drop in hemoglobin to 7.2 g/dL   HERNIA REPAIR     IR CHOLANGIOGRAM EXISTING TUBE  07/20/2016   IR PERC CHOLECYSTOSTOMY  05/10/2016   IR RADIOLOGIST EVAL & MGMT  06/08/2016   IR RADIOLOGIST EVAL & MGMT  06/29/2016   IR SINUS/FIST TUBE CHK-NON GI  07/12/2016   RIGHT/LEFT HEART CATH AND CORONARY ANGIOGRAPHY N/A 06/19/2017   Procedure: RIGHT/LEFT HEART CATH AND CORONARY ANGIOGRAPHY;  Surgeon: Mardell Shade, MD;  Location: [facility] INVASIVE CV LAB;  Service: Cardiovascular;  Laterality: N/A;   TIBIA IM NAIL INSERTION Right 07/12/2016   Procedure: INTRAMEDULLARY (IM) NAIL RIGHT TIBIA;  Surgeon: Wes Hamman, MD;  Location: [facility] OR;  Service: Orthopedics;  Laterality: Right;   UMBILICAL HERNIA REPAIR  ~ 1963   "that's why I don't have a belly button"   VAGINAL HYSTERECTOMY       Medications:   Current Facility-Administered Medications:    albuterol  (VENTOLIN  HFA) 108 (90 Base) MCG/ACT inhaler 2 puff, 2 puff, Inhalation, Q6H PRN,  Sherel Dikes, PA-C   amLODipine  (NORVASC ) tablet 10 mg, 10 mg, Oral, Daily, Sherel Dikes, PA-C   atorvastatin  (LIPITOR ) tablet 20 mg, 20 mg, Oral, Daily, Sherel Dikes, PA-C   calcitRIOL  (ROCALTROL ) capsule 0.5 mcg, 0.5 mcg, Oral, Daily, Sherel Dikes, PA-C   carvedilol  (COREG ) tablet 12.5 mg, 12.5 mg, Oral, BID, Sherel Dikes, PA-C, 12.5 mg at 06/01/23 1478   DULoxetine  (CYMBALTA ) DR capsule 30 mg, 30 mg, Oral, Daily, Sherel Dikes, PA-C   escitalopram  (LEXAPRO ) tablet 10 mg, 10 mg, Oral, Daily, Sherel Dikes, PA-C   ezetimibe  (ZETIA ) tablet 10 mg, 10 mg, Oral, Daily, Sherel Dikes, PA-C   ferrous sulfate  tablet 325 mg, 325 mg, Oral, Daily, Sherel Dikes, PA-C   lamoTRIgine  (LAMICTAL ) tablet 100 mg, 100 mg, Oral, Daily, Sherel Dikes, PA-C   metolazone  (ZAROXOLYN ) tablet 2.5 mg, 2.5 mg, Oral, Q M,W,F, Sherel Dikes, PA-C   mirtazapine  (REMERON ) tablet 7.5 mg, 7.5 mg, Oral, QHS, Browning, Robert, PA-C   pantoprazole  (PROTONIX ) EC tablet 40 mg, 40 mg, Oral, Daily, Sherel Dikes, PA-C   prazosin  (MINIPRESS ) capsule 2 mg, 2 mg, Oral, QHS, Browning, Robert, PA-C   pregabalin  (LYRICA ) capsule 100 mg, 100 mg, Oral, TID, Eliseo Guiles, Robert, PA-C   QUEtiapine  (SEROQUEL ) tablet 50 mg, 50 mg, Oral, BID, Sherel Dikes, PA-C   torsemide  (DEMADEX ) tablet 20 mg, 20 mg, Oral, BID, Sherel Dikes, PA-C, 20 mg at 06/01/23 2956  Current Outpatient Medications:    cephALEXin  (KEFLEX ) 500 MG capsule, Take 1 capsule (500 mg total) by mouth 2 (two) times daily., Disp: 14 capsule, Rfl: 0   albuterol  (VENTOLIN  HFA) 108 (90 Base) MCG/ACT inhaler, INHALE 2 PUFFS INTO THE LUNGS EVERY 6 HOURS AS NEEDED FOR WHEEZING OR SHORTNESS OF BREATH, Disp: 8.5 g, Rfl: 2   amLODipine  (NORVASC ) 10 MG tablet, Take 1 tablet (10 mg total) by mouth daily., Disp: 30 tablet, Rfl: 5   Asenapine  Maleate (SAPHRIS ) 10 MG SUBL, Place 1 tablet under the tongue daily., Disp: , Rfl:    atorvastatin  (LIPITOR )  20 MG tablet, Take 1 tablet (20 mg total) by mouth  daily., Disp: 30 tablet, Rfl: 5   azithromycin  (ZITHROMAX ) 250 MG tablet, Take 2  tablets by mouth  on day one then 1 tablet once a day for four days. (Patient not taking: Reported on 03/11/2023), Disp: 6 tablet, Rfl: 0   calcitRIOL  (ROCALTROL ) 0.5 MCG capsule, Take 1 capsule (0.5 mcg total) by mouth daily., Disp: 30 capsule, Rfl: 5   carvedilol  (COREG ) 12.5 MG tablet, Take 1 tablet (12.5 mg total) by mouth 2 (two) times daily., Disp: 60 tablet, Rfl: 5   cyclobenzaprine  (FLEXERIL ) 10 MG tablet, Take 1 tablet (10 mg total) by mouth 3 (three) times daily., Disp: 90 tablet, Rfl: 5   dicyclomine  (BENTYL ) 10 MG capsule, Take 1 capsule (10 mg total) by mouth 3 (three) times daily before meals., Disp: 90 capsule, Rfl: 5   DULoxetine  (CYMBALTA ) 30 MG capsule, Take 1 capsule (30 mg total) by mouth daily., Disp: 30 capsule, Rfl: 0   epoetin  alfa (EPOGEN ) 10000 UNIT/ML injection, Inject 1 mL (10,000 Units total) into the vein every Monday, Wednesday, and Friday with hemodialysis., Disp: 1 mL, Rfl: 3   escitalopram  (LEXAPRO ) 10 MG tablet, Take 1 tablet (10 mg total) by mouth daily., Disp: 30 tablet, Rfl: 0   ezetimibe  (ZETIA ) 10 MG tablet, Take 1 tablet (10 mg total) by mouth daily. Please call office to schedule an appt for further refills. Thank you, Disp: 90 tablet, Rfl: 0   ferrous sulfate  325 (65 FE) MG tablet, Take 1 tablet (325 mg total) by mouth every other day. (Patient taking differently: Take 325 mg by mouth daily.), Disp: 30 tablet, Rfl: 5   Fluticasone -Umeclidin-Vilant (TRELEGY ELLIPTA ) 100-62.5-25 MCG/ACT AEPB, INHALE 1 PUFF INTO THE LUNGS DAILY, Disp: 60 each, Rfl: 0   folic acid  (FOLVITE ) 1 MG tablet, Take 1 tablet (1 mg total) by mouth daily., Disp: 30 tablet, Rfl: 5   hydrOXYzine  (ATARAX ) 10 MG tablet, Take 1 tablet (10 mg total) by mouth 3 (three) times daily as needed for anxiety., Disp: 90 tablet, Rfl: 0   ibuprofen  (ADVIL ) 200 MG tablet, Take  400 mg by mouth 3 (three) times daily., Disp: , Rfl:    lamoTRIgine  (LAMICTAL ) 100 MG tablet, Take 1 tablet (100 mg total) by mouth daily., Disp: 30 tablet, Rfl: 5   lidocaine  (LIDODERM ) 5 %, Place 1 patch onto the skin daily. Remove & Discard patch within 12 hours or as directed by MD (Patient taking differently: Place 1 patch onto the skin daily. Remove & Discard patch within 12 hours or as directed by MD - Left side), Disp: 3 patch, Rfl: 0   loperamide  (IMODIUM ) 2 MG capsule, Take 1 capsule (2 mg total) by mouth 2 (two) times daily., Disp: 30 capsule, Rfl: 5   Melatonin 10 MG CAPS, Take 10 mg by mouth at bedtime as needed (Sleep)., Disp: , Rfl:    metolazone  (ZAROXOLYN ) 2.5 MG tablet, Take 2.5 mg by mouth every Monday, Wednesday, and Friday., Disp: , Rfl:    mirtazapine  (REMERON ) 7.5 MG tablet, Take 1 tablet (7.5 mg total) by mouth at bedtime., Disp: 30 tablet, Rfl: 0   omeprazole  (PRILOSEC) 40 MG capsule, Take 1 capsule (40 mg total) by mouth every morning., Disp: 30 capsule, Rfl: 5   potassium chloride  SA (KLOR-CON  M) 20 MEQ tablet, Take 20 mEq by mouth See admin instructions. Sometimes the patient needs and sometimes she doesn't. [name] instructed by the dialysis center on when to take it., Disp: , Rfl:    prazosin  (MINIPRESS ) 2 MG  capsule, Take 1 capsule (2 mg total) by mouth at bedtime. (Patient not taking: Reported on 03/11/2023), Disp: 30 capsule, Rfl: 5   pregabalin  (LYRICA ) 100 MG capsule, Take 100 mg by mouth 3 (three) times daily. (Patient not taking: Reported on 03/11/2023), Disp: , Rfl:    QUEtiapine  (SEROQUEL ) 50 MG tablet, Take 1 tablet (50 mg total) by mouth 2 (two) times daily., Disp: 60 tablet, Rfl: 0   torsemide  (DEMADEX ) 20 MG tablet, Take 1 tablet (20 mg total) by mouth 2 (two) times daily. (Patient taking differently: Take 20 mg by mouth in the morning, at noon, and at bedtime.), Disp: 60 tablet, Rfl: 2  Allergies: Allergies  Allergen Reactions   Ahmed Ales ] Other (See  Comments)    GI bleeds   Hydrocodone  Shortness Of Breath and Hives   Latuda [Lurasidone Hcl] Anaphylaxis   Magnesium -Containing Compounds Anaphylaxis and Other (See Comments)    Tolerated Ensure   Prednisone  Anaphylaxis, Swelling and Other (See Comments)    Swelling of tongue, lips, throat   Ultram  [Tramadol ] Anaphylaxis, Swelling and Rash   Codeine Nausea And Vomiting and Rash   Desyrel  [Trazodone ] Other (See Comments)    Paranoia    Hydralazine  Other (See Comments)    Muscle spasms   Peanut (Diagnostic) Hives and Other (See Comments)    Raw peanuts only  OK to eat peanut butter.   Peanut-Containing Drug Products Hives    Raw peanuts, but can eat peanut butter   Topamax  [Topiramate ] Other (See Comments)    Paranoia   Sulfa Antibiotics Itching   Tape Rash    Jeraline Moment, NP
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
## 2023-06-01 NOTE — ED Notes (Signed)
 Patient moved to hospital bed for comfort-Monique,RN

## 2023-06-01 NOTE — Consult Note (Addendum)
 Renal Service Consult Note [facility]  Tiffany [name] 06/01/2023 Lynae Sandifer, MD Requesting Physician: No admitting doctor provided  Reason for Consult: ESRD patient with paranoid schizophrenia HPI: The patient is a 63 y.o. year-old w/ PMH as below who presented to ED with presentation of hallucinations and homicidal ideations.  She met criteria for inpatient psychiatric drug hospitalization.  She was seen by psychiatry.  [name] boarding in the ED for now.  We are asked to see for dialysis   Pt seen in ED room.  She has no complaints at this time and denies any leg swelling, shortness of breath or chest pain   ROS - denies CP, no joint pain, no HA, no blurry vision, no rash, no diarrhea, no nausea/ vomiting  PMH: Asthma Anemia Anxiety Cardiac arrest Chronic diastolic CHF Chronic pain syndrome Chronic respiratory failure with hypoxia and hypercapnia Cocaine use disorder COPD Depression Drug abuse Dyslipidemia Hypertension GI bleed Gout Manic depression Morbid obesity OSA Home oxygen  therapy Schizoaffective 2 disorder, bipolar type History of seizures History of stroke Diabetes type 2  Past Surgical History  Past Surgical History:  Procedure Laterality Date   A/V FISTULAGRAM N/A 02/01/2023   Procedure: A/V Fistulagram;  Surgeon: Baron Border, MD;  Location: [facility] INVASIVE CV LAB;  Service: Cardiovascular;  Laterality: N/A;   AV FISTULA PLACEMENT Left 12/21/2022   Procedure: INSERTION OF LEFT ARM ARTERIOVENOUS (AV) GORE-TEX GRAFT;  Surgeon: Carlene Che, MD;  Location: [facility] OR;  Service: Vascular;  Laterality: Left;   CESAREAN SECTION  1997   COLONOSCOPY WITH PROPOFOL  N/A 04/01/2021   Procedure: COLONOSCOPY WITH PROPOFOL ;  Surgeon: Alvis Jourdain, MD;  Location[facility] ENDOSCOPY;  Service: Gastroenterology;  Laterality: N/A;  Rectal bleeding with drop in hemoglobin to 7.2 g/dL   HERNIA REPAIR     IR CHOLANGIOGRAM EXISTING TUBE  07/20/2016   IR  PERC CHOLECYSTOSTOMY  05/10/2016   IR RADIOLOGIST EVAL & MGMT  06/08/2016   IR RADIOLOGIST EVAL & MGMT  06/29/2016   IR SINUS/FIST TUBE CHK-NON GI  07/12/2016   RIGHT/LEFT HEART CATH AND CORONARY ANGIOGRAPHY N/A 06/19/2017   Procedure: RIGHT/LEFT HEART CATH AND CORONARY ANGIOGRAPHY;  Surgeon: Mardell Shade, MD;  Location: [facility] INVASIVE CV LAB;  Service: Cardiovascular;  Laterality: N/A;   TIBIA IM NAIL INSERTION Right 07/12/2016   Procedure: INTRAMEDULLARY (IM) NAIL RIGHT TIBIA;  Surgeon: Wes Hamman, MD;  Location: [facility] OR;  Service: Orthopedics;  Laterality: Right;   UMBILICAL HERNIA REPAIR  ~ 1963   "that's why I don't have a belly button"   VAGINAL HYSTERECTOMY     Family History  Family History  Problem Relation Age of Onset   Cancer Mother        lung   Depression Mother    Cancer Father        prostate   Depression Sister    Anxiety disorder Sister    Schizophrenia Sister    Bipolar disorder Sister    Depression Sister    Depression Brother    Heart failure Other        cousin   Social History  reports that she quit smoking about 7 years ago. Her smoking use included cigarettes. She started smoking about 46 years ago. She has a 58.6 pack-year smoking history. She has been exposed to tobacco smoke. She has never used smokeless tobacco. She reports that she does not currently use drugs after having used the following drugs: Cocaine. She reports that  she does not drink alcohol. Allergies  Allergies  Allergen Reactions   Dyane Glance ] Other (See Comments)    GI bleeds   Hydrocodone  Shortness Of Breath and Hives   Latuda [Lurasidone Hcl] Anaphylaxis   Magnesium -Containing Compounds Anaphylaxis and Other (See Comments)    Tolerated Ensure   Prednisone  Anaphylaxis, Swelling and Other (See Comments)    Swelling of tongue, lips, throat   Ultram  [Tramadol ] Anaphylaxis, Swelling and Rash   Codeine Nausea And Vomiting and Rash   Desyrel  [Trazodone ] Other (See Comments)     Paranoia    Hydralazine  Other (See Comments)    Muscle spasms   Peanut (Diagnostic) Hives and Other (See Comments)    Raw peanuts only  OK to eat peanut butter.   Peanut-Containing Drug Products Hives    Raw peanuts, but can eat peanut butter   Topamax  [Topiramate ] Other (See Comments)    Paranoia   Sulfa Antibiotics Itching   Tape Rash   Home medications Prior to Admission medications   Medication Sig Start Date End Date Taking? Authorizing Provider  albuterol  (VENTOLIN  HFA) 108 (90 Base) MCG/ACT inhaler INHALE 2 PUFFS INTO THE LUNGS EVERY 6 HOURS AS NEEDED FOR WHEEZING OR SHORTNESS OF BREATH 10/24/22  Yes Lavada Porteous, DO  amLODipine  (NORVASC ) 10 MG tablet Take 1 tablet (10 mg total) by mouth daily. 02/26/23  Yes   Asenapine  Maleate (SAPHRIS ) 10 MG SUBL Place 1 tablet under the tongue daily.   Yes [provider]  atorvastatin  (LIPITOR ) 20 MG tablet Take 1 tablet (20 mg total) by mouth daily. 10/09/22  Yes McDiarmid, Demetra Filter, MD  calcitRIOL  (ROCALTROL ) 0.5 MCG capsule Take 1 capsule (0.5 mcg total) by mouth daily. 10/09/22  Yes McDiarmid, Demetra Filter, MD  carvedilol  (COREG ) 12.5 MG tablet Take 1 tablet (12.5 mg total) by mouth 2 (two) times daily. 02/26/23  Yes   cephALEXin  (KEFLEX ) 500 MG capsule Take 1 capsule (500 mg total) by mouth 2 (two) times daily. 06/01/23  Yes Sherel Dikes, PA-C  cyclobenzaprine  (FLEXERIL ) 10 MG tablet Take 1 tablet (10 mg total) by mouth 3 (three) times daily. 10/09/22  Yes McDiarmid, Demetra Filter, MD  dicyclomine  (BENTYL ) 10 MG capsule Take 1 capsule (10 mg total) by mouth 3 (three) times daily before meals. 10/09/22  Yes McDiarmid, Demetra Filter, MD  DULoxetine  (CYMBALTA ) 30 MG capsule Take 1 capsule (30 mg total) by mouth daily. 03/15/23  Yes Volanda Gruber, NP  epoetin  alfa (EPOGEN ) 10000 UNIT/ML injection Inject 1 mL (10,000 Units total) into the vein every Monday, Wednesday, and Friday with hemodialysis. 11/22/22  Yes Juliana Ocean, DO  escitalopram   (LEXAPRO ) 10 MG tablet Take 1 tablet (10 mg total) by mouth daily. 03/15/23  Yes Volanda Gruber, NP  ezetimibe  (ZETIA ) 10 MG tablet Take 1 tablet (10 mg total) by mouth daily. Please call office to schedule an appt for further refills. Thank you 04/27/23  Yes Elmyra Haggard, MD  ferrous sulfate  325 (65 FE) MG tablet Take 1 tablet (325 mg total) by mouth every other day. Patient taking differently: Take 325 mg by mouth daily. 10/09/22 10/04/23 Yes McDiarmid, Demetra Filter, MD  Fluticasone -Umeclidin-Vilant (TRELEGY ELLIPTA ) 100-62.5-25 MCG/ACT AEPB INHALE 1 PUFF INTO THE LUNGS DAILY 03/20/22  Yes Lavada Porteous, DO  folic acid  (FOLVITE ) 1 MG tablet Take 1 tablet (1 mg total) by mouth daily. 10/09/22  Yes McDiarmid, Demetra Filter, MD  hydrOXYzine  (ATARAX ) 10 MG tablet Take 1 tablet (10 mg total) by mouth 3 (three)  times daily as needed for anxiety. 03/14/23  Yes Volanda Gruber, NP  ibuprofen  (ADVIL ) 200 MG tablet Take 400 mg by mouth 3 (three) times daily.   Yes [provider]  lamoTRIgine  (LAMICTAL ) 100 MG tablet Take 1 tablet (100 mg total) by mouth daily. 02/26/23  Yes   lidocaine  (LIDODERM ) 5 % Place 1 patch onto the skin daily. Remove & Discard patch within 12 hours or as directed by MD Patient taking differently: Place 1 patch onto the skin daily. Remove & Discard patch within 12 hours or as directed by MD - Left side 02/20/23  Yes Cala Castleman, MD  loperamide  (IMODIUM ) 2 MG capsule Take 1 capsule (2 mg total) by mouth 2 (two) times daily. Patient taking differently: Take 2 mg by mouth 3 (three) times daily. 10/09/22  Yes McDiarmid, Demetra Filter, MD  Melatonin 10 MG CAPS Take 10 mg by mouth at bedtime as needed (Sleep).   Yes [provider]  metolazone  (ZAROXOLYN ) 2.5 MG tablet Take 2.5 mg by mouth every Monday, Wednesday, and Friday. 11/16/22  Yes [provider]  mirtazapine  (REMERON ) 7.5 MG tablet Take 1 tablet (7.5 mg total) by mouth at bedtime. 03/14/23  Yes Volanda Gruber, NP   omeprazole  (PRILOSEC) 40 MG capsule Take 1 capsule (40 mg total) by mouth every morning. 10/09/22  Yes McDiarmid, Demetra Filter, MD  QUEtiapine  (SEROQUEL ) 50 MG tablet Take 1 tablet (50 mg total) by mouth 2 (two) times daily. Patient taking differently: Take 100 mg by mouth 3 (three) times daily. 03/14/23  Yes Volanda Gruber, NP  torsemide  (DEMADEX ) 20 MG tablet Take 1 tablet (20 mg total) by mouth 2 (two) times daily. Patient taking differently: Take 20 mg by mouth in the morning, at noon, and at bedtime. 02/13/23  Yes   potassium chloride  SA (KLOR-CON  M) 20 MEQ tablet Take 20 mEq by mouth See admin instructions. Sometimes the patient needs and sometimes she doesn't. [name] instructed by the dialysis center on when to take it. Patient not taking: Reported on 06/01/2023    [provider]  prazosin  (MINIPRESS ) 2 MG capsule Take 1 capsule (2 mg total) by mouth at bedtime. Patient not taking: Reported on 03/11/2023 02/26/23     pregabalin  (LYRICA ) 100 MG capsule Take 100 mg by mouth 3 (three) times daily. Patient not taking: Reported on 03/11/2023 02/27/23   [provider]     Vitals:   06/01/23 0115 06/01/23 0244 06/01/23 0747 06/01/23 1027  BP: 128/77  136/77 116/71  Pulse: 78  81 72  Resp: (!) 22  18 18   Temp:  98.2 F (36.8 C) 98.1 F (36.7 C) 97.7 F (36.5 C)  TempSrc:  Oral Oral Oral  SpO2: 97%  95% 95%  Weight:      Height:       Exam Gen alert, pleasant older adult in no distress No rash, cyanosis or gangrene Sclera anicteric, throat clear  No jvd or bruits Chest clear bilat to bases, no rales/ wheezing RRR no MRG Abd soft ntnd no mass or ascites +bs GU deferred MS no joint effusions or deformity Ext has large ankles/lower legs with minimal edema Neuro is alert, Ox 3 , nf    RIJ TDC intact      Renal-related home meds: Norvasc  10 daily  Coreg  12.5 Mg twice daily Rocaltrol  0.5 mcg daily Metoprolol  2.5 Mg MWF Torsemide  20 mg twice daily Prazosin  2 mg at  bedtime Others: Lyrica , Seroquel , PPI, Remeron ,  Lamictal , Zetia , Lexapro , Cymbalta , statin    OP HD: [facility] M[facility]  4h   B500   83kg   RIJ TDC    Heparin  none Good compliance, was coming off 3-5kg over last 2 wks Except only 0-1kg over last 2 HD sessions Bp's usually are on the high side     Assessment/ Plan: Paranoid schizophrenia decompensation: Per psychiatry ESRD: on HD MWF.  Has not missed dialysis last HD was Wednesday.  Patient is stable from a lab and volume standpoint. HD tonight or tomorrow HTN: is on 3 blood pressure lowering meds at home and torsemide .  Continue meds Volume: No gross volume overload on exam, last 2 treatments came off very close with her dry weight.  UF 2 L with next dialysis Anemia of esrd: Hemoglobin 11-12 here, follow. Secondary hyperparathyroidism: Corrected calcium  in range, add on Phos   Rob Zana Hesselbach  MD [facility] 06/01/2023, 2:45 PM  Recent Labs  Lab 05/31/23 2050  HGB 11.8*  ALBUMIN  3.5  CALCIUM  9.3  CREATININE 5.43*  K 3.0*   Inpatient medications:  amLODipine   10 mg Oral Daily   atorvastatin   20 mg Oral Daily   calcitRIOL   0.5 mcg Oral Daily   carvedilol   12.5 mg Oral BID   DULoxetine   30 mg Oral Daily   escitalopram   10 mg Oral Daily   ezetimibe   10 mg Oral Daily   ferrous sulfate   325 mg Oral Daily   lamoTRIgine   100 mg Oral Daily   metolazone   2.5 mg Oral Q M,W,F   mirtazapine   7.5 mg Oral QHS   pantoprazole   40 mg Oral Daily   prazosin   2 mg Oral QHS   pregabalin   100 mg Oral TID   QUEtiapine   50 mg Oral BID   torsemide   20 mg Oral BID    albuterol 

## 2023-06-01 NOTE — Progress Notes (Addendum)
 LCSW Progress Note:   MRN: [id number]  Sanela Mogel  06/01/2023 6:55 pm  Per Lady Pier, NP, We recommend inpatient psychiatric hospitalization when medically cleared. Patient is under voluntary admission status at this time; please IVC if attempts to leave hospital.   Patient referred to [facility] Supervisor, [place] for consideration of bed placement and it was confirmed that [facility] has no available bed at this time. Clinician advised to fax patient out.   Patient was faxed out to the following facilities:   Destination  Service Provider   Address Phone Fax Patient Preferred  [facility]   [address], [place] [address] [phone] --  [facility] [address]   [place] [address] [phone] --  [facility]   [address] [place] [address] [phone] --  [facility]   [address] [place], [place] [address] [phone] --  [facility]   [address] [place] [address] [phone] --  [facility]   [address] [place], [place] [address] [phone] --  [facility]   [address], [place] [address] [phone] --  [facility]   [address] Johnella Naas [place] [address] [phone] --  [facility]   [phone] [address] [place] [address] [phone] --  [facility]   [address] [place] [address] [phone] --  [facility]   [address], [place] [address] [phone] Ales[facility] Husky [facility]   [place] [phone] --  [facility]   [address] [place] [address] [phone] --  [facility]   [address], [place] [address] [phone] --  [facility]   [address], [place] [address] [phone] --  [address] EFAX   [address] Sharren Decree [place] [phone] --  [facility]   [address] [place] [address] [phone] --  [facility]   [address] [place] [address] [phone] --  [facility]   [address] [place] [address] [phone] --  [name]   [address], [place] [address] [phone] --  [facility]   [address] [place], [place] [address] [phone] --  [facility]   [address] [place] [address] [phone] --  [facility]   [address] [place] [address] [phone] --  [facility]   [address], [place] [address] [phone] --  [facility] Josephina Nicks [place] [address] [phone] --  [facility]   [address] [place] [address] [phone] --  [facility] [address]   [address], [place] [address] [phone] --  [facility]   [address] Johnella Naas [place] [address] [phone] --  [facility]   [address] [place], [place] [address] [phone] --  [facility]   [address] [place] [address] [phone] --  [facility]   [address] [place] [address] [phone] --  [facility]   [address] [place] [address]  [phone] --  [facility]   [address], [place] [address] [phone] --  [facility]   [address] [place] [address] [phone] --  [facility]   [address] [place] [address] [phone] --

## 2023-06-01 NOTE — Discharge Instructions (Addendum)
 A referral for psychiatry and therapy appointment has been made at [facility], a virtual platform. They will be calling your phone within 24 hours to set up appointments.    Outpatient psychiatric Services  Walk in hours for medication management Monday, Wednesday, Thursday, and Friday from 8:00 AM to 11:00 AM Recommend arriving by by 7:30 AM.  It is first come first serve.    Walk in hours for therapy intake Monday and Wednesday only 8:00 AM to 11:00 AM Encouraged to arrive by 7:30 AM.  It is first come first serve   Inpatient patient psychiatric services The [facility] offers comprehensive behavioral heath care services for mental health and substance abuse treatment.  Social work can also assist with referral to or getting you into a rehabilitation program short or long term  Set designer Resources:  Intensive Outpatient Programs: [employer]      [address] [place], [place] [phone] Both a day and evening program       [facility]     [address]        [place], [place] [address] [phone]         ADS: [facility] [address] [place] [phone]  [facility] ACCESS LINE: [phone] or [phone] [address] [place], [place] [address] [email or web address]   Substance Abuse Resources: [facility]  [phone] [facility] [phone] [facility] [place] [phone] Kenny Peals [phone] Residential & Outpatient Substance Abuse Program  [phone]  Psychological Services: [facility]  [phone] [facility] [place]  [phone] [facility], [phone] [place]. [address] [place], ACCESS LINE: [phone] or [phone], [email or web address]  Mobile Crisis Teams:                                        Therapeutic  Alternatives         Mobile Crisis Care Unit [phone]             [facility] [address] Jonette Nestle [phone]                                         Interventionist [address] [place] [address] [place] [phone]  Self-Help/Support Groups: [facility] [employer] of support groups [phone] (call for more info)  Narcotics Anonymous (NA) Caring Services [address] [place] - 2 meetings at this location  Residential Treatment Programs:  [facility]      [address]        [place]       [phone]         [facility] [address], [place] [address] [place], [place]  [address] [phone]  [facility]  [address] [place], [place] [address] [phone] Admissions: 8am-3pm M-F  [facility]     [address]        [place], [place] [address]       [phone]         [facility] [address] Elda Greener [place], [place] [phone]  [facility] [address] [place], [place] [phone]  [facility] - Intensive  Outpatient      [address]     [place], [place]       [phone]         [facility] ([facility]     [address] [place], [place] [phone] or [phone]  Residential Treatment Services (RTS), Medicaid [address] [place], [place] [phone]  [facility] [address]                                               [address] [place] [phone]  [facility] Resources: [phone]               General Therapy                                                Trixie Furnace, PhD        [address] [place], [place] [address]         [phone]   Insurance  [facility]   [address] [place],  [place] [address] [phone]  [facility] [address] [place], [place] [address] [phone] Insurance/Medicaid/sponsorship through [facility] and Families                                              [address] [place] [address]                                        [place], [place] [address]    Therapy/tele-psych/case         [phone]          [facility] [address] [place], [place]  [address]  Adolescent/group home/case management [phone]                                           Artist Binet PhD       General therapy       Insurance   [phone]         Dr. Carlos [place], [place], M-F [phone]  [facility]  [employer] [facility] Dept. [address]                 [address]         [address] [place] [address]  [address]  [facility] Phone:  [phone]                                  Phone:  [phone]                   Phone:  [phone]  [facility], [phone] [facility]- [phone]       -     [facility] in [place], [address]             [phone], Insurance

## 2023-06-02 LAB — HEPATITIS B SURFACE ANTIGEN: Hepatitis B Surface Ag: NONREACTIVE

## 2023-06-02 LAB — PHOSPHORUS: Phosphorus: 6.4 mg/dL — ABNORMAL HIGH (ref 2.5–4.6)

## 2023-06-02 MED ORDER — IBUPROFEN 100 MG/5ML PO SUSP
200.0000 mg | Freq: Once | ORAL | Status: AC
  Administered 2023-06-02: 200 mg via ORAL
  Filled 2023-06-02: qty 10

## 2023-06-02 MED ORDER — CEPHALEXIN 250 MG PO CAPS
250.0000 mg | ORAL_CAPSULE | ORAL | Status: DC
  Administered 2023-06-03 – 2023-06-04 (×2): 250 mg via ORAL
  Filled 2023-06-02 (×2): qty 1

## 2023-06-02 MED ORDER — STERILE WATER FOR INJECTION IJ SOLN
INTRAMUSCULAR | Status: AC
  Administered 2023-06-02: 1.2 mL
  Filled 2023-06-02: qty 10

## 2023-06-02 MED ORDER — ZIPRASIDONE MESYLATE 20 MG IM SOLR
20.0000 mg | Freq: Once | INTRAMUSCULAR | Status: AC
Start: 2023-06-02 — End: 2023-06-02
  Administered 2023-06-02: 20 mg via INTRAMUSCULAR
  Filled 2023-06-02: qty 20

## 2023-06-02 MED ORDER — CYCLOBENZAPRINE HCL 10 MG PO TABS
5.0000 mg | ORAL_TABLET | Freq: Once | ORAL | Status: AC
  Administered 2023-06-02: 5 mg via ORAL
  Filled 2023-06-02: qty 1

## 2023-06-02 MED ORDER — HEPARIN SODIUM (PORCINE) 1000 UNIT/ML DIALYSIS: 1000.0000 [IU] | INTRAMUSCULAR | Status: DC | PRN

## 2023-06-02 NOTE — Progress Notes (Signed)
 Pt wanted to be disconnected early. Pt states we dont know anything down here but to kill people. Pt was asked to sign the early termination of treatment but also refused. 2 RN sign it. Pt also refused heplock. And only allowed to use saline as saline flush for catheter.

## 2023-06-02 NOTE — ED Provider Notes (Signed)
 Emergency Medicine Observation Re-evaluation Note  Tiffany [name] is a 63 y.o. female, seen on rounds today.  Pt initially presented to the ED for complaints of Dysuria, Wound Check, and Mental Health Problem  63 y.o. female presents to [facility] hospital on [facility]/2023 with painful urination, paranoia, hallucinations and homicidal ideations. PMH ESRD, OSA, HTN, CHF, COPD, DM2, CKD, PTSD, gout, seizures, CVA, anoxic brain injury, peripheral neuropathy, schizoaffective disorder, MI x2, HLD, and drug abuse (cocaine).   Currently, the patient is asleep. On arousal, she denies any complaint of pain this am.  Physical Exam  BP (!) 143/90 (BP Location: Right Arm)   Pulse 73   Temp 98.2 F (36.8 C) (Oral)   Resp 17   Ht 5\' 3"  (1.6 m)   Wt 82.4 kg   SpO2 93%   BMI 32.18 kg/m  Physical Exam General: NAD Cardiac: well perfused Lungs: even and unlabored Abd: Non-tender to palpation, no rebound or guarding Psych: no agitation  ED Course / MDM  EKG:   I have reviewed the labs performed to date as well as medications administered while in observation.  Recent changes in the last 24 hours include dialysis terminated early after 2.5 hrs. Pt had been agitated overnight. Per notes, pt had 292 in her bladder. She had been complaining of suprapubic pain. This am, the patient was resting comfortably and a wet brief had been changed earlier. She denies any complaint of pain this am. Low concern for acute urinary retention as the patient has been voiding into a brief. Pt remains medically cleared. Per psychiatry notes, "Her current presentation of sadness, anger, hallucinations and homicidal ideations is most consistent with decompensated paranoid schizophrenia."  Plan  Current plan is for inpatient psychiatric admission.    Rosealee Concha, MD 06/02/23 1126

## 2023-06-02 NOTE — ED Notes (Signed)
 Patient returned from Dialysis being verbally aggressive to staff and demanding we help her with her mental state; pt threatens to get up and fall; Pt sitting on the side of bed attempting to get off the bed; EDP notified of patient's behavior; Pt had same behaving in Dialysis per the HD RN; Pt was unable to get full treatment done tonight-Monique,RN

## 2023-06-02 NOTE — Progress Notes (Signed)
 Tiffany [name] Progress Note  Subjective:  Had HD last night No c/o's today  Vitals:   06/02/23 0300 06/02/23 0330 06/02/23 0335 06/02/23 1016  BP: 116/89 (!) 128/95 138/73 (!) 143/90  Pulse: 79 85 84 73  Resp: 14 (!) 21 18 17   Temp:    98.2 F (36.8 C)  TempSrc:    Oral  SpO2: 99% 96% 97% 93%  Weight:      Height:        Exam: Gen alert, pleasant No jvd or bruits Chest clear bilat to bases RRR no MRG Abd soft ntnd no mass or ascites +bs Ext large ankles/ legs with minimal edema Neuro is alert, Ox 3 , nf    RIJ TDC intact    Renal-related home meds: Norvasc  10 daily  Coreg  12.5 Mg twice daily Rocaltrol  0.5 mcg daily Metoprolol  2.5 Mg MWF Torsemide  20 mg twice daily Prazosin  2 mg at bedtime Others: Lyrica , Seroquel , PPI, Remeron , Lamictal , Zetia , Lexapro , Cymbalta , statin      OP HD: [facility] M[facility]  4h   B500   83kg   RIJ TDC    Heparin  none Good compliance, was coming off 3-5kg over last 2 wks Except only 0-1kg over last 2 HD sessions Bp's usually are on the high side       Assessment/ Plan: Paranoid schizophrenia: w/ sig decompensation. Per psychiatry ESRD: on HD MWF.  No missed HD. Had HD here overnight last night. Next HD Monday.  HTN: is on 3 blood pressure lowering meds at home and torsemide .  Continue meds Volume: euvolemic on exam. 1.3 L UF w/ HD last night.  Anemia of esrd: Hemoglobin 11-12 here, follow. Secondary hyperparathyroidism: Corrected calcium  and phos are in range for the most part. Follow. Binders w/ meals.        Tiffany Poag MD  [facility] 06/02/2023, 1:58 PM  Recent Labs  Lab 05/31/23 2050 06/02/23 0104  HGB 11.8*  --   ALBUMIN  3.5  --   CALCIUM  9.3  --   PHOS  --  6.4*  CREATININE 5.43*  --   K 3.0*  --    No results for input(s): "IRON ", "TIBC", "FERRITIN" in the last 168 hours. Inpatient medications:  amLODipine   10 mg Oral Daily   atorvastatin   20 mg Oral Daily   calcitRIOL   0.5 mcg Oral Daily   carvedilol   12.5 mg  Oral BID   [START ON 06/03/2023] cephALEXin   250 mg Oral Q24H   Chlorhexidine  Gluconate Cloth  6 each Topical Q0600   DULoxetine   30 mg Oral Daily   escitalopram   10 mg Oral Daily   ezetimibe   10 mg Oral Daily   ferrous sulfate   325 mg Oral Daily   lamoTRIgine   100 mg Oral Daily   metolazone   2.5 mg Oral Q M,W,F   mirtazapine   7.5 mg Oral QHS   pantoprazole   40 mg Oral Daily   prazosin   2 mg Oral QHS   pregabalin   100 mg Oral TID   QUEtiapine   50 mg Oral BID   torsemide   20 mg Oral BID    albuterol 

## 2023-06-02 NOTE — Progress Notes (Signed)
 Received patient in bed to unit.  Alert and oriented.  Informed consent signed and in chart.   TX duration:2.5  Patient asked to terminate tx early see previous notes. Refuse to be re-assessed.  Transported back to the room  Alert, without acute distress.  Hand-off given to patient's nurse.   Access used: dialysis cath Access issues: line reversed. Arterial line got resistance with being aspirated but flushes well  Total UF removed: 1300 Medication(s) given: none cath was saline lock Post HD VS: see table below Post HD weight: refuse.    06/02/23 0335  Vitals  BP 138/73  MAP (mmHg) 89  BP Location Right Arm  BP Method Automatic  Patient Position (if appropriate) Lying  Pulse Rate 84  Pulse Rate Source Monitor  ECG Heart Rate 86  Resp 18  Oxygen  Therapy  SpO2 97 %  O2 Device Room Air  Patient Activity (if Appropriate) In bed  Pulse Oximetry Type Continuous  During Treatment Monitoring  Blood Flow Rate (mL/min) 0 mL/min  Arterial Pressure (mmHg) 28.08 mmHg  Venous Pressure (mmHg) -36.56 mmHg  TMP (mmHg) 9.7 mmHg  Ultrafiltration Rate (mL/min) 1137 mL/min  Dialysate Flow Rate (mL/min) 300 ml/min  Dialysate Potassium Concentration 4  Dialysate Calcium  Concentration 2.5  Duration of HD Treatment -hour(s) 2.28 hour(s)  Cumulative Fluid Removed (mL) per Treatment  1313.86  HD Safety Checks Performed Yes  Intra-Hemodialysis Comments See progress note  Post Treatment  Dialyzer Clearance Lightly streaked  Hemodialysis Intake (mL) 0 mL  Liters Processed 54.7  Fluid Removed (mL) 1300 mL  Tolerated HD Treatment No (Comment)  Post-Hemodialysis Comments goal not met  Hemodialysis Catheter Right Subclavian Double lumen Permanent (Tunneled)  Placement Date/Time: 10/16/22 (c)    Placed prior to admission: Yes  Orientation: Right  Access Location: Subclavian  Hemodialysis Catheter Type: Double lumen Permanent (Tunneled)  Site Condition No complications  Blue Lumen Status  Flushed;Dead end cap in place;Saline locked  Red Lumen Status Flushed;Saline locked;Dead end cap in place  Purple Lumen Status N/A  Post treatment catheter status Capped and Clamped      [facility]

## 2023-06-02 NOTE — Progress Notes (Signed)
 PHARMACY NOTE:  ANTIMICROBIAL RENAL DOSAGE ADJUSTMENT  Current antimicrobial regimen includes a mismatch between antimicrobial dosage and estimated renal function.  As per policy approved by the Pharmacy & Therapeutics and Medical Executive Committees, the antimicrobial dosage will be adjusted accordingly.  Current antimicrobial dosage:  Keflex  500mg  BID   Indication: E.Coli UTI   Renal Function:  Estimated Creatinine Clearance: 10.6 mL/min (A) (by C-G formula based on SCr of 5.43 mg/dL (H)). [x]      On intermittent HD, last 5/16  []      On CRRT    Antimicrobial dosage has been changed to:  Keflex  250mg  q24h    Additional comments:   Thank you for allowing pharmacy to be a part of this patient's care.  Mamie Searles, PharmD, BCCCP  06/02/2023 11:43 AM

## 2023-06-02 NOTE — Progress Notes (Signed)
 Pt started complaing pain in the bladder wants wanted pain meds.ER md was contacted and informed about pts situation. When asked again about patinet's pain she started getting agitated and yell that she dosent want pain meds but she wants foley as she is feels her bladder is full. Md informed and ordered to scan bladder. was in the bladder. Result relayed to md. Catheter not indicated if she can void. Purewick or to the potty. . Pt started getting agitated and started again. Stating something is wrong with me  and i needs to get admitted why are you guys doing anything. And you need to tell the doctor that I am sick and need to get admitted.

## 2023-06-03 LAB — URINE CULTURE: Culture: >=100000 — AB

## 2023-06-03 MED ORDER — CHLORHEXIDINE GLUCONATE CLOTH 2 % EX PADS: 6.0000 | MEDICATED_PAD | Freq: Every day | CUTANEOUS | Status: DC

## 2023-06-03 MED ORDER — HYDROXYZINE HCL 25 MG PO TABS
25.0000 mg | ORAL_TABLET | Freq: Once | ORAL | Status: AC
  Administered 2023-06-03: 25 mg via ORAL
  Filled 2023-06-03: qty 1

## 2023-06-03 MED ORDER — OLANZAPINE 5 MG PO TABS
5.0000 mg | ORAL_TABLET | Freq: Once | ORAL | Status: AC
  Administered 2023-06-03: 5 mg via ORAL
  Filled 2023-06-03: qty 1

## 2023-06-03 MED ORDER — SEVELAMER CARBONATE 800 MG PO TABS
800.0000 mg | ORAL_TABLET | Freq: Three times a day (TID) | ORAL | Status: DC
  Administered 2023-06-03 – 2023-06-04 (×3): 800 mg via ORAL
  Filled 2023-06-03 (×3): qty 1

## 2023-06-03 NOTE — Progress Notes (Addendum)
 [facility] Progress Note  Subjective:  Had HD last night No c/o's today Signed off HD at 2 hrs Friday night because she didn't "feel safe"  Vitals:   06/02/23 1828 06/03/23 0244 06/03/23 0736 06/03/23 1025  BP:  139/80 (!) 140/83 (!) 146/97  Pulse:  78 84 85  Resp: 18 18 17 17   Temp: 98.6 F (37 C) 98.6 F (37 C) 98.4 F (36.9 C) 98.6 F (37 C)  TempSrc: Oral Oral Oral Oral  SpO2: 98% 94% 95% 95%  Weight:      Height:        Exam: Gen alert, pleasant No jvd or bruits Chest clear bilat to bases RRR no MRG Abd soft ntnd no mass or ascites +bs Ext large ankles/ legs with minimal edema Neuro is alert, Ox 3 , nf    RIJ TDC intact    Renal-related home meds: Norvasc  10 daily  Coreg  12.5 Mg twice daily Rocaltrol  0.5 mcg daily Metoprolol  2.5 Mg MWF Torsemide  20 mg twice daily Prazosin  2 mg at bedtime Others: Lyrica , Seroquel , PPI, Remeron , Lamictal , Zetia , Lexapro , Cymbalta , statin      OP HD: [facility] MWF  4h   B500   83kg   RIJ TDC    Heparin  none Good compliance, was coming off 3-5kg over last 2 wks Except only 0-1kg over last 2 HD sessions Bp's usually are on the high side       Assessment/ Plan: Paranoid schizophrenia: w/ sig decompensation. Psych team recommends inpatient psych hospitalization.  ESRD: on HD MWF.  No missed HD. Had here Friday night, signed off early at 2 hrs. Next HD Monday.  HTN: on 3 blood pressure lowering meds, torsemide  and mwf metolazone  here as at home. BP's stable, continue.  Volume: euvolemic on exam.  Anemia of esrd: Hemoglobin 11-12 here, follow. Secondary hyperparathyroidism: Corrected calcium  and phos up slightly. Start renvela 800 ac tid.        Larry Poag MD  [facility] 06/03/2023, 12:48 PM  Recent Labs  Lab 05/31/23 2050 06/02/23 0104  HGB 11.8*  --   ALBUMIN  3.5  --   CALCIUM  9.3  --   PHOS  --  6.4*  CREATININE 5.43*  --   K 3.0*  --    No results for input(s): "IRON ", "TIBC", "FERRITIN" in the last  168 hours. Inpatient medications:  amLODipine   10 mg Oral Daily   atorvastatin   20 mg Oral Daily   calcitRIOL   0.5 mcg Oral Daily   carvedilol   12.5 mg Oral BID   cephALEXin   250 mg Oral Q24H   Chlorhexidine  Gluconate Cloth  6 each Topical Q0600   DULoxetine   30 mg Oral Daily   escitalopram   10 mg Oral Daily   ezetimibe   10 mg Oral Daily   ferrous sulfate   325 mg Oral Daily   lamoTRIgine   100 mg Oral Daily   metolazone   2.5 mg Oral Q M,W,F   mirtazapine   7.5 mg Oral QHS   pantoprazole   40 mg Oral Daily   prazosin   2 mg Oral QHS   pregabalin   100 mg Oral TID   QUEtiapine   50 mg Oral BID   torsemide   20 mg Oral BID    albuterol 

## 2023-06-03 NOTE — Evaluation (Signed)
 Occupational Therapy Evaluation Patient Details Name: Tiffany [name] MRN: [id number] DOB: [birth year] Today's Date: 06/03/2023   History of Present Illness   63 y.o. female presents to [facility] hospital on 05/31/2023 with painful urination, paranoia, hallucinations and homicidal ideations. PMH ESRD, OSA, HTN, CHF, COPD, DM2, CKD, PTSD, gout, seizures, CVA, anoxic brain injury, peripheral neuropathy, schizoaffective disorder, MI x2, HLD, and drug abuse (cocaine).     Clinical Impressions Patient admitted for the diagnosis above.  PTA she lives home alone, but appears to have PCA assist for several days of the week, patient also on HD.  Patient did not perform as well this date as she did with PT.  Patient needing Min A to stand times four attempts, and very unsteady on her feet at RW level.  Patient needing Mod A to prevent a fall backwards times one.  Up to Mod A for ADL completion from sit to stand due to unsteadiness.  OT will follow in the acute setting to address deficits, plan appears to be inpatient psych prior to returning home.         If plan is discharge home, recommend the following:   Assist for transportation;Assistance with cooking/housework;A lot of help with bathing/dressing/bathroom;A lot of help with walking and/or transfers     Functional Status Assessment   Patient has had a recent decline in their functional status and demonstrates the ability to make significant improvements in function in a reasonable and predictable amount of time.     Equipment Recommendations   BSC/3in1     Recommendations for Other Services         Precautions/Restrictions   Precautions Precautions: Fall Recall of Precautions/Restrictions: Intact Precaution/Restrictions Comments: No dorsiflexion to R foot Restrictions Weight Bearing Restrictions Per Provider Order: No     Mobility Bed Mobility               General bed mobility comments: up in chair     Transfers Overall transfer level: Needs assistance Equipment used: Rolling walker (2 wheels), None Transfers: Sit to/from Stand, Bed to chair/wheelchair/BSC Sit to Stand: Min assist     Step pivot transfers: Min assist            Balance Overall balance assessment: Needs assistance Sitting-balance support: Feet supported Sitting balance-Leahy Scale: Fair     Standing balance support: Reliant on assistive device for balance Standing balance-Leahy Scale: Poor                             ADL either performed or assessed with clinical judgement   ADL       Grooming: Wash/dry hands;Wash/dry face;Set up;Sitting           Upper Body Dressing : Minimal assistance;Sitting   Lower Body Dressing: Moderate assistance;Sit to/from stand   Toilet Transfer: Minimal assistance;Moderate assistance;Rolling walker (2 wheels);Regular Toilet;Ambulation             General ADL Comments: Patient with LOB back needing Mod A to prevent a fall.  No righting reactions.     Vision Baseline Vision/History: 1 Wears glasses Patient Visual Report: No change from baseline       Perception Perception: Not tested       Praxis Praxis: Not tested       Pertinent Vitals/Pain Pain Assessment Pain Assessment: Faces Faces Pain Scale: Hurts little more Pain Location: R leg Pain Descriptors / Indicators: Aching, Sore Pain Intervention(s): Monitored during session  Extremity/Trunk Assessment Upper Extremity Assessment Upper Extremity Assessment: Overall WFL for tasks assessed   Lower Extremity Assessment Lower Extremity Assessment: Defer to PT evaluation   Cervical / Trunk Assessment Cervical / Trunk Assessment: Kyphotic   Communication Communication Communication: Impaired Factors Affecting Communication: Reduced clarity of speech   Cognition Arousal: Alert Behavior During Therapy: WFL for tasks assessed/performed Cognition: History of cognitive  impairments                               Following commands: Intact       Cueing  General Comments   Cueing Techniques: Verbal cues      Exercises     Shoulder Instructions      Home Living Family/patient expects to be discharged to:: Private residence Living Arrangements: Alone Available Help at Discharge: Personal care attendant;Available PRN/intermittently Type of Home: House Home Access: Ramped entrance     Home Layout: One level     Bathroom Shower/Tub: Sponge bathes at baseline   Bathroom Toilet: Standard Bathroom Accessibility: Yes How Accessible: Accessible via walker Home Equipment: BSC/3in1;Hospital bed;Wheelchair - Surveyor, quantity (2 wheels)          Prior Functioning/Environment Prior Level of Function : Needs assist       Physical Assist : ADLs (physical)   ADLs (physical): IADLs;Dressing;Bathing Mobility Comments: pt typically performs stand pivot transfers from bed to her power or manual wheelchairs ADLs Comments: Bathes/dresses herself; states socks are difficult; wears gowns because they are easier; transfers on/off bed to change her depends, relies on aide for IADLs    OT Problem List: Decreased strength;Decreased range of motion;Decreased activity tolerance;Impaired balance (sitting and/or standing);Pain;Decreased safety awareness   OT Treatment/Interventions: Self-care/ADL training;Therapeutic activities;Visual/perceptual remediation/compensation;Patient/family education;Balance training;DME and/or AE instruction      OT Goals(Current goals can be found in the care plan section)   Acute Rehab OT Goals Patient Stated Goal: Get back home OT Goal Formulation: With patient Time For Goal Achievement: 06/18/23 Potential to Achieve Goals: Fair ADL Goals Pt Will Perform Grooming: with supervision;sitting;standing Pt Will Perform Upper Body Dressing: with set-up;sitting Pt Will Perform Lower Body Dressing: with contact  guard assist;sit to/from stand Pt Will Transfer to Toilet: with contact guard assist;ambulating;regular height toilet   OT Frequency:  Min 2X/week    Co-evaluation              AM-PAC OT "6 Clicks" Daily Activity     Outcome Measure Help from another person eating meals?: None Help from another person taking care of personal grooming?: A Little Help from another person toileting, which includes using toliet, bedpan, or urinal?: A Lot Help from another person bathing (including washing, rinsing, drying)?: A Lot Help from another person to put on and taking off regular upper body clothing?: A Little Help from another person to put on and taking off regular lower body clothing?: A Lot 6 Click Score: 16   End of Session Equipment Utilized During Treatment: Gait belt Nurse Communication: Mobility status  Activity Tolerance: Patient tolerated treatment well Patient left: in chair;with call bell/phone within reach;with nursing/sitter in room  OT Visit Diagnosis: Unsteadiness on feet (R26.81);Muscle weakness (generalized) (M62.81);History of falling (Z91.81)                Time: 1339-1400 OT Time Calculation (min): 21 min Charges:  OT General Charges $OT Visit: 1 Visit OT Evaluation $OT Eval Moderate Complexity: 1 Mod  06/03/2023  RP, OTR/L  Acute Rehabilitation Services  Office:  [phone]   Benjamen Brand 06/03/2023, 2:11 PM

## 2023-06-03 NOTE — Progress Notes (Signed)
 Patient has been denied by [facility] due to no appropriate beds available. Patient meets BH inpatient criteria per Lady Pier, NP. Patient has been faxed out to the following facilities:   [facility] [address] [place] [address] [phone]  [facility] [address] [place] [address] [phone]  [facility] [address] [place] [address] [phone]  [facility] [address] [place], [place] [address] [phone]  [facility] [address] [place] [address] [phone]  [facility] [address] [place], [place] [address] [phone]  [facility] [address] [place] [address] [phone]  [facility] [address] Johnella Naas [place] [address] [phone]  [facility] [address] [place] [address] [phone]  [facility] [address] [place] [address] [phone]  [facility] [address], [place] [address] [phone]  [facility] [place] [phone]  [facility] [address] [place] [address] [phone]  [facility] [address], [place] [address] [phone]  [facility] [address] [place] [address] [phone]  [facility] EFAX [address] Sharren Decree [place] [phone]  [facility] [address], [place] [address] [phone]  [facility] [address], [place] [address] [phone]  [facility] [address], [place] [address] [phone]  [facility] [address], [place] [address] [phone]  [facility] [address] [place] [address] [phone]  [facility] [address] [place] [address] [phone]  [facility] [address], [place] [address] [phone]  [facility] [address] [place] [address] [phone]  [facility] Josephina Nicks [place] [address] [phone]  [facility] [address] [place] [address] [phone]  [facility] [address], [place] [address] [phone]  [facility] [address] Johnella Naas [place] [address] [phone]  [facility] [address] [place], [place] [address] [phone]  [facility] [address] [place] [address] [phone]  [facility] [address] [place] [address] [phone]  [facility] [address], [place] [address] [phone]  [facility] [address], [place] [address] [phone]  [facility] [address], [place] [address] [phone]  [facility] [address], [place] [address] [phone]   Phares Brasher, MSW, LCSW-A  11:28 AM 06/03/2023

## 2023-06-03 NOTE — ED Provider Notes (Signed)
 Emergency Medicine Observation Re-evaluation Note  Tiffany [name] is a 63 y.o. female, seen on rounds today.  Pt initially presented to the ED for complaints of Dysuria, Wound Check, and Mental Health Problem  63 y.o. female [facility] with painful urination, paranoia, hallucinations and homicidal ideations. PMH ESRD, OSA, HTN, CHF, COPD, DM2, CKD, PTSD, gout, seizures, CVA, anoxic brain injury, peripheral neuropathy, schizoaffective disorder, MI x2, HLD, and drug abuse (cocaine).   Currently, the patient is resting.  Physical Exam  BP (!) 146/97   Pulse 85   Temp 98.6 F (37 C) (Oral)   Resp 17   Ht 5\' 3"  (1.6 m)   Wt 82.4 kg   SpO2 95%   BMI 32.18 kg/m  Physical Exam General: NAD Cardiac: well perfused Lungs: even and unlabored Psych: no agitation  ED Course / MDM  EKG:   I have reviewed the labs performed to date as well as medications administered while in observation.  Recent changes in the last 24 hours include none.  Plan  Current plan is for inpatient psychiatric admission.      Rosealee Concha, MD 06/03/23 [phone]

## 2023-06-04 DIAGNOSIS — F200 Paranoid schizophrenia: Secondary | ICD-10-CM | POA: Diagnosis not present

## 2023-06-04 LAB — RENAL FUNCTION PANEL
Albumin: 3 g/dL — ABNORMAL LOW (ref 3.5–5.0)
Anion gap: 12 (ref 5–15)
BUN: 59 mg/dL — ABNORMAL HIGH (ref 8–23)
CO2: 25 mmol/L (ref 22–32)
Calcium: 9.1 mg/dL (ref 8.9–10.3)
Chloride: 94 mmol/L — ABNORMAL LOW (ref 98–111)
Creatinine, Ser: 5.83 mg/dL — ABNORMAL HIGH (ref 0.44–1.00)
GFR, Estimated: 8 mL/min — ABNORMAL LOW (ref >60)
Glucose, Bld: 132 mg/dL — ABNORMAL HIGH (ref 70–99)
Phosphorus: 4.7 mg/dL — ABNORMAL HIGH (ref 2.5–4.6)
Potassium: 3.8 mmol/L (ref 3.5–5.1)
Sodium: 131 mmol/L — ABNORMAL LOW (ref 135–145)

## 2023-06-04 LAB — CBC
HCT: 36.6 % (ref 36.0–46.0)
Hemoglobin: 11.4 g/dL — ABNORMAL LOW (ref 12.0–15.0)
MCH: 31.1 pg (ref 26.0–34.0)
MCHC: 31.1 g/dL (ref 30.0–36.0)
MCV: 99.7 fL (ref 80.0–100.0)
Platelets: 257 10*3/uL (ref 150–400)
RBC: 3.67 MIL/uL — ABNORMAL LOW (ref 3.87–5.11)
RDW: 15.3 % (ref 11.5–15.5)
WBC: 7.8 10*3/uL (ref 4.0–10.5)
nRBC: 0 % (ref 0.0–0.2)

## 2023-06-04 LAB — CBG MONITORING, ED: Glucose-Capillary: 194 mg/dL — ABNORMAL HIGH (ref 70–99)

## 2023-06-04 MED ORDER — ANTICOAGULANT SODIUM CITRATE 4% (200MG/5ML) IV SOLN
5.0000 mL | Status: DC | PRN
  Filled 2023-06-04: qty 5

## 2023-06-04 MED ORDER — LIDOCAINE HCL (PF) 1 % IJ SOLN: 5.0000 mL | INTRAMUSCULAR | Status: DC | PRN

## 2023-06-04 MED ORDER — MIRTAZAPINE 15 MG PO TABS: 15.0000 mg | ORAL_TABLET | Freq: Every day | ORAL | Status: DC

## 2023-06-04 MED ORDER — PENTAFLUOROPROP-TETRAFLUOROETH EX AERO: 1.0000 | INHALATION_SPRAY | CUTANEOUS | Status: DC | PRN

## 2023-06-04 MED ORDER — HEPARIN SODIUM (PORCINE) 1000 UNIT/ML IJ SOLN
INTRAMUSCULAR | Status: AC
  Filled 2023-06-04: qty 1

## 2023-06-04 MED ORDER — HEPARIN SODIUM (PORCINE) 1000 UNIT/ML DIALYSIS
1000.0000 [IU] | INTRAMUSCULAR | Status: DC | PRN
  Administered 2023-06-04: 3200 [IU]
  Filled 2023-06-04: qty 1

## 2023-06-04 MED ORDER — LIDOCAINE-PRILOCAINE 2.5-2.5 % EX CREA: 1.0000 | TOPICAL_CREAM | CUTANEOUS | Status: DC | PRN

## 2023-06-04 MED ORDER — CEPHALEXIN 500 MG PO CAPS: 500.0000 mg | ORAL_CAPSULE | Freq: Two times a day (BID) | ORAL | 0 refills | Status: DC

## 2023-06-04 MED ORDER — CALCITRIOL 0.5 MCG PO CAPS
ORAL_CAPSULE | ORAL | Status: AC
  Filled 2023-06-04: qty 1

## 2023-06-04 MED ORDER — ALTEPLASE 2 MG IJ SOLR: 2.0000 mg | Freq: Once | INTRAMUSCULAR | Status: DC | PRN

## 2023-06-04 NOTE — Procedures (Signed)
 Received patient in bed to unit.  Alert and oriented.  Informed consent signed and in chart.   TX duration:3.5 hours  Patient tolerated well.  Transported back to the room  Alert, without acute distress.  Hand-off given to patient's nurse.   Access used: right cath Access issues: none  Total UF removed: 3 liters Medication(s) given: calcitriol    Clover Dao, RN [facility]

## 2023-06-04 NOTE — Procedures (Signed)
 C/o cramps in hands, UF placed in pause

## 2023-06-04 NOTE — Progress Notes (Signed)
 [facility] Progress Note  Subjective:  Seen and examined on dialysis.  Blood pressure 135/85 and HR 71.  Tolerating goal. RIJ tunn catheter in place. Procedure supervised.    Review of systems:  Denies shortness of breath or chest pain  No dizziness or cramping  No n/v     Vitals:   06/03/23 2126 06/04/23 0753 06/04/23 0914 06/04/23 0930  BP: 115/84 131/81 (!) 119/91 134/87  Pulse: 75 77 72 68  Resp: 18 18 20 18   Temp: 98.7 F (37.1 C) 98 F (36.7 C) 97.6 F (36.4 C)   TempSrc: Oral Oral    SpO2: 93% 94% 98% 98%  Weight:      Height:        Physical Exam:  General adult female in bed in no acute distress HEENT normocephalic atraumatic extraocular movements intact sclera anicteric Neck supple trachea midline Lungs clear to auscultation bilaterally normal work of breathing at rest on room air Heart S1S2 no rub Abdomen soft nontender nondistended Extremities no edema  Psych normal mood and affect Neuro awake and alert. Conversant. Follows commands Access RIJ tunn catheter; old access LUE without bruit or thrill      Renal-related home meds: Norvasc  10 daily  Coreg  12.5 Mg twice daily Rocaltrol  0.5 mcg daily Metoprolol  2.5 Mg MWF Torsemide  20 mg twice daily Prazosin  2 mg at bedtime Others: Lyrica , Seroquel , PPI, Remeron , Lamictal , Zetia , Lexapro , Cymbalta , statin      OP HD: [place] MWF  4h   B500   83kg   RIJ TDC    Heparin  none Good compliance, was coming off 3-5kg over last 2 wks Except only 0-1kg over last 2 HD sessions Bp's usually are on the high side       Assessment/ Plan: Paranoid schizophrenia: w/ sig decompensation. Psych team recommends inpatient psych hospitalization.  ESRD: on HD MWF schedule HTN: continue  Volume: euvolemic on exam.  Anemia of esrd: Hemoglobin 11-12 here, follow. No indication for ESA currently.  Secondary hyperparathyroidism: Corrected calcium  and phos up slightly. Started renvela  800 ac tid.    Disposition -  awaiting psych bed per charting    Recent Labs  Lab 05/31/23 2050 06/02/23 0104 06/04/23 0817  HGB 11.8*  --  11.4*  ALBUMIN  3.5  --   --   CALCIUM  9.3  --   --   PHOS  --  6.4*  --   CREATININE 5.43*  --   --   K 3.0*  --   --    No results for input(s): "IRON ", "TIBC", "FERRITIN" in the last 168 hours. Inpatient medications:  amLODipine   10 mg Oral Daily   atorvastatin   20 mg Oral Daily   calcitRIOL   0.5 mcg Oral Daily   carvedilol   12.5 mg Oral BID   cephALEXin   250 mg Oral Q24H   Chlorhexidine  Gluconate Cloth  6 each Topical Q0600   Chlorhexidine  Gluconate Cloth  6 each Topical Q0600   DULoxetine   30 mg Oral Daily   escitalopram   10 mg Oral Daily   ezetimibe   10 mg Oral Daily   ferrous sulfate   325 mg Oral Daily   lamoTRIgine   100 mg Oral Daily   metolazone   2.5 mg Oral Q M,W,F   mirtazapine   7.5 mg Oral QHS   pantoprazole   40 mg Oral Daily   prazosin   2 mg Oral QHS   pregabalin   100 mg Oral TID   QUEtiapine   50 mg Oral BID   sevelamer   carbonate  800 mg Oral TID WC   torsemide   20 mg Oral BID    anticoagulant sodium citrate      albuterol , alteplase , anticoagulant sodium citrate , heparin , lidocaine  (PF), lidocaine -prilocaine , pentafluoroprop-tetrafluoroeth     Nan Aver, MD 9:55 AM 06/04/2023

## 2023-06-04 NOTE — Consult Note (Signed)
 [facility] Psychiatric Consult follow up  Patient Name: .Tiffany [name]  MRN: [id number]  DOB: Oct 01, 1960  Consult Order details:  Orders (From admission, onward)     Start     Ordered   06/01/23 0459  CONSULT TO CALL ACT TEAM       Ordering Provider: Sherel Dikes, PA-C  Provider:  (Not yet assigned)  Question:  Reason for Consult?  Answer:  Psych consult   06/01/23 0458             Mode of Visit: In person    Psychiatry Consult Evaluation  Service Date: Jun 04, 2023 LOS: 4 days Chief Complaint "I feel helpless"  Primary Psychiatric Diagnoses  Schizophrenia, paranoid 2.  Homicidal Ideation   Assessment  Tiffany [name] is a 63 y.o. female admitted: Presented to the EDfor 05/31/2023  9:22 PM brought in by EMS from home reporting painful urination, paranoia, hallucinations and homicidal ideations. She carries the psychiatric diagnoses of paranoid personality disorder, binge eating disorder, cocaine abuse, bipolar affective disorder, depressive disorder, schizoaffective disorder, PTSD, anxiety and schizophrenia and has a past medical history of CAD, HTN, Heart failure, OSA, COPD, asthma, GERD, Diabetes type 1, MI, osteoarthritis, morbid obesity, gout, chronic kidney diseases stage 5 and ESRD on dialysis.   06/01/23 - Her current presentation of sadness, anger, hallucinations and homicidal ideations is most consistent with decompensated paranoid schizophrenia. She meets criteria for inpatient psychiatric hospitalization based on being a danger to others and acute psychosis.  Current outpatient psychotropic medications include saphris , cymbalta , lexapro , atarax , lamictal , mirtazipine and seroquel  and historically she has had a positive response to these medications. She was not compliant with medications prior to admission as evidenced by patient report that she ran out of medications. On initial examination, patient is tearful, angry and hallucinating. Please see plan below for  detailed recommendations.   Diagnoses:  Active Hospital problems: Principal Problem:   Schizophrenia, paranoid (HCC) Active Problems:   Homicidal ideation    Plan   ## Psychiatric Medication Recommendations:  --cymbalta  30mg  PO Q day --lamictal  100mg  PO Q day --mirtazapine  12 mg PO Q HS --prazosin  2mg  PO Q HS --seroquel  50mg  PO BID, 200 mg Qhs  ## Medical Decision Making Capacity: Not specifically addressed in this encounter  ## Further Work-up:  -- most recent EKG on 03/12/23 had QtC of 469; updated EKG pending -- Pertinent labwork reviewed earlier this admission includes: CBC, CMP, UA, alcohol and UDS   ## Disposition:-- no barriers to discharge  ## Behavioral / Environmental: -Utilize compassion and acknowledge the patient's experiences while setting clear and realistic expectations for care.    ## Safety and Observation Level:  - Based on my clinical evaluation, I estimate the patient to be at low risk of self harm in the current setting. - At this time, we recommend  routine. This decision is based on my review of the chart including patient's history and current presentation, interview of the patient, mental status examination, and consideration of suicide risk including evaluating suicidal ideation, plan, intent, suicidal or self-harm behaviors, risk factors, and protective factors. This judgment is based on our ability to directly address suicide risk, implement suicide prevention strategies, and develop a safety plan while the patient is in the clinical setting. Please contact our team if there is a concern that risk level has changed.  CSSR Risk Category:C-SSRS RISK CATEGORY: Low Risk  Suicide Risk Assessment: Patient has following modifiable risk factors for suicide: under treated depression  and  medication noncompliance, which were addressed by treatment while in the ED and outpatient set up. Patient has following non-modifiable or demographic risk factors for  suicide: psychiatric hospitalization Patient has the following protective factors against suicide: Supportive family  Thank you for this consult request. Recommendations have been communicated to the primary team.  We will psych clear at this time.   [name], NP       History of Present Illness  Relevant Aspects of Hospital ED Course:  Admitted on 05/31/2023 brought in by EMS from home reporting painful urination, paranoia, hallucinations and homicidal ideations. She carries the psychiatric diagnoses of paranoid personality disorder, binge eating disorder, cocaine abuse, bipolar affective disorder, depressive disorder, schizoaffective disorder, PTSD, anxiety and schizophrenia and has a past medical history of CAD, HTN, Heart failure, OSA, COPD, asthma, GERD, Diabetes type 1, MI, osteoarthritis, morbid obesity, gout, chronic kidney diseases stage 5 and ESRD on dialysis.   Patient Report:  Pt seen at [facility] for psychiatric reevaluation. Pt originally seen on 06/01/23 by psychiatry due to patient not taking her medications at home. Pt does have hx of paranoid schizophrenia, and after chart review does appear her baseline is paranoia towards her mother.   Today, patient stated she is feeling much better and requesting to discharge home. She denies any feeling of paranoia, or that anyone is trying to harm her. Pt still has "feelings" towards her mom where she would like to set more boundaries. She states her mom has a key to her place, and comes "whenever she feels like it." Pt would like to take the key back in order to set up more boundaries so she can't just come over unannounced anymore. Pt states her medications were delivered to her house about a day before she came to the hospital, so she says she has all of her medications and actually brought them with her here to the hospital. She denies SI. Denies HI. Denis AVH. Denies any access to weapons or firearms at home. During our conversation  patient appeared logical, no delusions or paranoia outside of her baseline, no concerns for psychosis at this time. She currently follows up with [facility] for medication management. However, she is wanting to start therapy. She stated she previously had therapy and she found it helpful, but has not seen a therapist in years. Informed patient I can put a referral in for virtual therapy appointment and she was agreeable. A referral to [facility] was made.   During evaluation PAULEEN GOLEMAN is sitting in no acute distress. [name] alert, oriented x 4, calm, cooperative and attentive.  Her mood is euthymic with congruent affect.  She has normal speech, and behavior.  Objectively there is no evidence of psychosis/mania or delusional thinking.  Patient is able to converse coherently, goal directed thoughts, no distractibility, or pre-occupation.  She also denies suicidal/self-harm/homicidal ideation, psychosis, and paranoia.  Patient answered question appropriately. While future psychiatric events cannot be accurately predicted, the patient does not currently require further acute psychiatric care and does not currently meet [facility]  involuntary commitment criteria. It is recommended that the patient continue treatment in outpatient care. A follow up plan and crisis plan are in place, have been discussed with the patient, and the patient agrees to the plan at time of discharge.       Pt declined collateral at this time.    Review of Systems  Respiratory:  Positive for shortness of breath.   Musculoskeletal:  Utilizes a wheelchair  Skin:        Wounds on buttocks  Psychiatric/Behavioral:  Positive for depression and hallucinations.   All other systems reviewed and are negative.    Psychiatric and Social History  Psychiatric History:  Information collected from patient and chart review  Prev Dx/Sx: paranoid personality disorder, binge eating disorder, cocaine abuse,  bipolar affective disorder, depressive disorder, schizoaffective disorder, PTSD, anxiety and schizophrenia Current Psych Provider: none for 2 years per patient  Home Meds (current): saphris , cymbalta , lexapro , atarax , lamictal , mirtazipine and seroquel  Previous Med Trials: unknown Therapy: none for 2 years  Prior Psych Hospitalization: yes  Prior Self Harm: endorses Prior Violence: denies  Family Psych History: sister - schizophrenia, bipolar disorder and depression; sister - anxiety and depression; brother - depression; mother - depression Family Hx suicide: none noted  Social History:  Developmental Hx: wnl Educational Hx: some college Occupational Hx: disability Legal Hx: denies Living Situation: states she has her own place Spiritual Hx: christian Access to weapons/lethal means: denies   Substance History Alcohol: denies  Tobacco: denies Illicit drugs: denies current; previous cocaine abuse Prescription drug abuse: denies  Rehab hx: unknown  Exam Findings  Physical Exam:  Vital Signs:  Temp:  [97.6 F (36.4 C)-98.7 F (37.1 C)] 98.3 F (36.8 C) (05/19 1310) Pulse Rate:  [68-87] 75 (05/19 1310) Resp:  [15-20] 18 (05/19 1310) BP: (110-149)/(74-107) 122/92 (05/19 1310) SpO2:  [93 %-100 %] 98 % (05/19 1310) Blood pressure (!) 122/92, pulse 75, temperature 98.3 F (36.8 C), resp. rate 18, height 5\' 3"  (1.6 m), weight 82.4 kg, SpO2 98%. Body mass index is 32.18 kg/m.  Physical Exam Vitals and nursing note reviewed.  Constitutional:      Appearance: [name] obese.  Pulmonary:     Effort: Pulmonary effort is normal.  Skin:    General: Skin is dry.     Comments: Wounds on buttocks  Neurological:     Mental Status: [name] alert and oriented to person, place, and time.     Mental Status Exam: General Appearance: Casual  Orientation:  Full (Time, Place, and Person)  Memory:  Immediate;   Fair Recent;   Fair Remote;   Fair  Concentration:  Concentration: Fair   Recall:  Fair  Attention  Good  Eye Contact:  Good  Speech:  WDL  Language:  Fair  Volume:  Normal  Mood: "good"  Affect:  Congruent  Thought Process:  Linear  Thought Content:  WDL  Suicidal Thoughts:  No  Homicidal Thoughts:  Denies  Judgement:  fair  Insight:  fair  Psychomotor Activity:  Decreased  Akathisia:  No  Fund of Knowledge:  Fair      Assets:  Health and safety inspector Housing Leisure Time  Cognition:  Impaired,  Mild  ADL's:  Impaired  AIMS (if indicated):        Other History   These have been pulled in through the EMR, reviewed, and updated if appropriate.  Family History:  The patient's family history includes Anxiety disorder in her sister; Bipolar disorder in her sister; Cancer in her father and mother; Depression in her brother, mother, sister, and sister; Heart failure in an other family member; Schizophrenia in her sister.  Medical History: Past Medical History:  Diagnosis Date   Abdominal pain    Acute encephalopathy 11/21/2017   Acute exacerbation of CHF (congestive heart failure) (HCC) 03/03/2022   Acute GI bleeding 03/29/2021   Acute metabolic encephalopathy 02/20/2020   Acute  on chronic diastolic heart failure (HCC) 02/04/2022   Aggressive behavior    Agitation 11/22/2017   Anoxic brain injury (HCC) 09/08/2016   C. Arrest due to respiratory failure and COPD exacerbation   Anxiety    Arthritis    "all over" (04/10/2016)   Asthma 10/18/2010   Binge eating disorder    Blood loss anemia 04/24/2011   CAP (community acquired pneumonia) 06/22/2015   Cardiac arrest (HCC) 09/08/2016   PEA   Carotid artery stenosis    1-39% bilateral by dopplers 11/2016   Chronic diastolic (congestive) heart failure (HCC)    Chronic pain syndrome 06/18/2012   Chronic post-traumatic stress disorder (PTSD) 05/27/2018   Chronic respiratory failure with hypoxia and hypercapnia (HCC) 06/22/2015   TRILOGY Vent >AVAPA-ES., Vt target 200-400, Max P 30 , PS max  20 , PS min 6-10 , E Max 6, E Min 4, Rate Auto AVAPS Rate 2 (titrate for pt comfort) , bleed O2 at 5l/m continuous flow .    Closed displaced fracture of fifth metacarpal bone 03/21/2018   Cocaine use disorder, severe, in sustained remission (HCC) 12/17/2015   Complication of anesthesia    decreased bp, decreased heart rate   COPD (chronic obstructive pulmonary disease) (HCC) 07/08/2014   Delusional disorder, persecutory type (HCC) 06/26/2021   Depression    Diabetic neuropathy (HCC) 04/24/2011   Difficulty with speech 01/24/2018   Disorder of nervous system    Drug abuse (HCC) 11/21/2017   Dyslipidemia 04/24/2011   E. coli UTI 02/20/2020   Elevated troponin 04/28/2012   Emphysema    Encephalopathy 11/21/2017   Essential hypertension 03/22/2016   Facet arthropathy, lumbar 06/12/2022   Fibula fracture 07/10/2016   Frequent falls 10/11/2017   GERD (gastroesophageal reflux disease)    GI bleed 03/30/2021   Gout 04/11/2017   Heart attack (HCC) 1980s   Hematochezia    History of blood transfusion 1994   "couldn't stop bleeding from my period"   History of drug abuse in remission (HCC) 11/28/2015   Quit in 2017   Hyperlipidemia 03/31/2021   Hyperlipidemia LDL goal <70    Hypocalcemia    Hypokalemia    Hypomagnesemia    Incontinence    Manic depression (HCC)    Morbid obesity (HCC) 10/18/2010   Obstructive sleep apnea 10/18/2010   On home oxygen  therapy    "6L; 24/7" (04/10/2016)   OSA on CPAP    "wear mask sometimes" (04/10/2016)   Painless rectal bleeding 12/31/2015   Paranoid (HCC)    "sometimes; I'm on RX for it" (04/10/2016)   Periumbilical abdominal pain    Prolonged Q-T interval on ECG    Rectal bleeding 12/31/2015   Recurrent syncope 07/09/2021   Rhabdomyolysis 06/16/2021   Schizoaffective disorder, bipolar type (HCC) 04/05/2018   Seasonal allergies    Seborrheic keratoses 12/31/2013   Seizures (HCC)    "don't know what kind; last one was ~ 1 yr ago" (04/10/2016)    Sinus bradycardia    Skin ulcer of sacrum, limited to breakdown of skin (HCC) 03/08/2022   Spondylolisthesis at L3-L4 level 06/12/2022   Stroke (HCC) 1980s   denies residual on 04/10/2016   Thrush 09/19/2013   Type 2 diabetes mellitus (HCC) 10/18/2010    Surgical History: Past Surgical History:  Procedure Laterality Date   A/V FISTULAGRAM N/A 02/01/2023   Procedure: A/V Fistulagram;  Surgeon: Baron Border, MD;  Location: [facility] INVASIVE CV LAB;  Service: Cardiovascular;  Laterality: N/A;   AV FISTULA PLACEMENT  Left 12/21/2022   Procedure: INSERTION OF LEFT ARM ARTERIOVENOUS (AV) GORE-TEX GRAFT;  Surgeon: Carlene Che, MD;  Location: [facility] OR;  Service: Vascular;  Laterality: Left;   CESAREAN SECTION  1997   COLONOSCOPY WITH PROPOFOL  N/A 04/01/2021   Procedure: COLONOSCOPY WITH PROPOFOL ;  Surgeon: Alvis Jourdain, MD;  Location[facility] ENDOSCOPY;  Service: Gastroenterology;  Laterality: N/A;  Rectal bleeding with drop in hemoglobin to 7.2 g/dL   HERNIA REPAIR     IR CHOLANGIOGRAM EXISTING TUBE  07/20/2016   IR PERC CHOLECYSTOSTOMY  05/10/2016   IR RADIOLOGIST EVAL & MGMT  06/08/2016   IR RADIOLOGIST EVAL & MGMT  06/29/2016   IR SINUS/FIST TUBE CHK-NON GI  07/12/2016   RIGHT/LEFT HEART CATH AND CORONARY ANGIOGRAPHY N/A 06/19/2017   Procedure: RIGHT/LEFT HEART CATH AND CORONARY ANGIOGRAPHY;  Surgeon: Mardell Shade, MD;  Location: [facility] INVASIVE CV LAB;  Service: Cardiovascular;  Laterality: N/A;   TIBIA IM NAIL INSERTION Right 07/12/2016   Procedure: INTRAMEDULLARY (IM) NAIL RIGHT TIBIA;  Surgeon: Wes Hamman, MD;  Location: [facility] OR;  Service: Orthopedics;  Laterality: Right;   UMBILICAL HERNIA REPAIR  ~ 1963   "that's why I don't have a belly button"   VAGINAL HYSTERECTOMY       Medications:   Current Facility-Administered Medications:    albuterol  (VENTOLIN  HFA) 108 (90 Base) MCG/ACT inhaler 2 puff, 2 puff, Inhalation, Q6H PRN, Sherel Dikes, PA-C   amLODipine  (NORVASC ) tablet  10 mg, 10 mg, Oral, Daily, Sherel Dikes, PA-C, 10 mg at 06/03/23 1025   atorvastatin  (LIPITOR ) tablet 20 mg, 20 mg, Oral, Daily, Sherel Dikes, PA-C, 20 mg at 06/03/23 1025   calcitRIOL  (ROCALTROL ) capsule 0.5 mcg, 0.5 mcg, Oral, Daily, Sherel Dikes, PA-C, 0.5 mcg at 06/04/23 1325   carvedilol  (COREG ) tablet 12.5 mg, 12.5 mg, Oral, BID, Sherel Dikes, PA-C, 12.5 mg at 06/03/23 1759   cephALEXin  (KEFLEX ) capsule 250 mg, 250 mg, Oral, Q24H, Synthia Ewing, RPH, 250 mg at 06/03/23 1759   Chlorhexidine  Gluconate Cloth 2 % PADS 6 each, 6 each, Topical, Q0600, Chucky Craver, MD   Chlorhexidine  Gluconate Cloth 2 % PADS 6 each, 6 each, Topical, Q0600, Chucky Craver, MD   DULoxetine  (CYMBALTA ) DR capsule 30 mg, 30 mg, Oral, Daily, Sherel Dikes, PA-C, 30 mg at 06/03/23 1023   ezetimibe  (ZETIA ) tablet 10 mg, 10 mg, Oral, Daily, Sherel Dikes, PA-C, 10 mg at 06/03/23 1023   ferrous sulfate  tablet 325 mg, 325 mg, Oral, Daily, Sherel Dikes, PA-C, 325 mg at 06/03/23 1023   lamoTRIgine  (LAMICTAL ) tablet 100 mg, 100 mg, Oral, Daily, Sherel Dikes, PA-C, 100 mg at 06/03/23 1025   metolazone  (ZAROXOLYN ) tablet 2.5 mg, 2.5 mg, Oral, Q M,W,F, Sherel Dikes, PA-C, 2.5 mg at 06/04/23 1610   mirtazapine  (REMERON ) tablet 15 mg, 15 mg, Oral, QHS, Caymen Dubray, Lucious Sabina, NP   pantoprazole  (PROTONIX ) EC tablet 40 mg, 40 mg, Oral, Daily, Sherel Dikes, PA-C, 40 mg at 06/03/23 1022   prazosin  (MINIPRESS ) capsule 2 mg, 2 mg, Oral, QHS, Browning, Robert, PA-C, 2 mg at 06/03/23 2128   QUEtiapine  (SEROQUEL ) tablet 50 mg, 50 mg, Oral, BID, Sherel Dikes, PA-C, 50 mg at 06/03/23 2129   sevelamer  carbonate (RENVELA ) tablet 800 mg, 800 mg, Oral, TID WC, Chucky Craver, MD, 800 mg at 06/04/23 9604   torsemide  (DEMADEX ) tablet 20 mg, 20 mg, Oral, BID, Sherel Dikes, PA-C, 20 mg at 06/04/23 5409  Current Outpatient Medications:    albuterol  (VENTOLIN  HFA) 108 (90 Base) MCG/ACT inhaler,  INHALE 2  PUFFS INTO THE LUNGS EVERY 6 HOURS AS NEEDED FOR WHEEZING OR SHORTNESS OF BREATH, Disp: 8.5 g, Rfl: 2   amLODipine  (NORVASC ) 10 MG tablet, Take 1 tablet (10 mg total) by mouth daily., Disp: 30 tablet, Rfl: 5   Asenapine  Maleate (SAPHRIS ) 10 MG SUBL, Place 1 tablet under the tongue daily., Disp: , Rfl:    atorvastatin  (LIPITOR ) 20 MG tablet, Take 1 tablet (20 mg total) by mouth daily., Disp: 30 tablet, Rfl: 5   calcitRIOL  (ROCALTROL ) 0.5 MCG capsule, Take 1 capsule (0.5 mcg total) by mouth daily., Disp: 30 capsule, Rfl: 5   carvedilol  (COREG ) 12.5 MG tablet, Take 1 tablet (12.5 mg total) by mouth 2 (two) times daily., Disp: 60 tablet, Rfl: 5   cephALEXin  (KEFLEX ) 500 MG capsule, Take 1 capsule (500 mg total) by mouth 2 (two) times daily., Disp: 14 capsule, Rfl: 0   cyclobenzaprine  (FLEXERIL ) 10 MG tablet, Take 1 tablet (10 mg total) by mouth 3 (three) times daily., Disp: 90 tablet, Rfl: 5   dicyclomine  (BENTYL ) 10 MG capsule, Take 1 capsule (10 mg total) by mouth 3 (three) times daily before meals., Disp: 90 capsule, Rfl: 5   DULoxetine  (CYMBALTA ) 30 MG capsule, Take 1 capsule (30 mg total) by mouth daily., Disp: 30 capsule, Rfl: 0   epoetin  alfa (EPOGEN ) 10000 UNIT/ML injection, Inject 1 mL (10,000 Units total) into the vein every Monday, Wednesday, and Friday with hemodialysis., Disp: 1 mL, Rfl: 3   escitalopram  (LEXAPRO ) 10 MG tablet, Take 1 tablet (10 mg total) by mouth daily., Disp: 30 tablet, Rfl: 0   ezetimibe  (ZETIA ) 10 MG tablet, Take 1 tablet (10 mg total) by mouth daily. Please call office to schedule an appt for further refills. Thank you, Disp: 90 tablet, Rfl: 0   ferrous sulfate  325 (65 FE) MG tablet, Take 1 tablet (325 mg total) by mouth every other day. (Patient taking differently: Take 325 mg by mouth daily.), Disp: 30 tablet, Rfl: 5   Fluticasone -Umeclidin-Vilant (TRELEGY ELLIPTA ) 100-62.5-25 MCG/ACT AEPB, INHALE 1 PUFF INTO THE LUNGS DAILY, Disp: 60 each, Rfl: 0   folic acid   (FOLVITE ) 1 MG tablet, Take 1 tablet (1 mg total) by mouth daily., Disp: 30 tablet, Rfl: 5   hydrOXYzine  (ATARAX ) 10 MG tablet, Take 1 tablet (10 mg total) by mouth 3 (three) times daily as needed for anxiety., Disp: 90 tablet, Rfl: 0   ibuprofen  (ADVIL ) 200 MG tablet, Take 400 mg by mouth 3 (three) times daily., Disp: , Rfl:    lamoTRIgine  (LAMICTAL ) 100 MG tablet, Take 1 tablet (100 mg total) by mouth daily., Disp: 30 tablet, Rfl: 5   lidocaine  (LIDODERM ) 5 %, Place 1 patch onto the skin daily. Remove & Discard patch within 12 hours or as directed by MD (Patient taking differently: Place 1 patch onto the skin daily. Remove & Discard patch within 12 hours or as directed by MD - Left side), Disp: 3 patch, Rfl: 0   loperamide  (IMODIUM ) 2 MG capsule, Take 1 capsule (2 mg total) by mouth 2 (two) times daily. (Patient taking differently: Take 2 mg by mouth 3 (three) times daily.), Disp: 30 capsule, Rfl: 5   Melatonin 10 MG CAPS, Take 10 mg by mouth at bedtime as needed (Sleep)., Disp: , Rfl:    metolazone  (ZAROXOLYN ) 2.5 MG tablet, Take 2.5 mg by mouth every Monday, Wednesday, and Friday., Disp: , Rfl:    mirtazapine  (REMERON ) 7.5 MG tablet, Take 1 tablet (7.5 mg total) by mouth  at bedtime., Disp: 30 tablet, Rfl: 0   omeprazole  (PRILOSEC) 40 MG capsule, Take 1 capsule (40 mg total) by mouth every morning., Disp: 30 capsule, Rfl: 5   QUEtiapine  (SEROQUEL ) 50 MG tablet, Take 1 tablet (50 mg total) by mouth 2 (two) times daily. (Patient taking differently: Take 100 mg by mouth 3 (three) times daily.), Disp: 60 tablet, Rfl: 0   torsemide  (DEMADEX ) 20 MG tablet, Take 1 tablet (20 mg total) by mouth 2 (two) times daily. (Patient taking differently: Take 20 mg by mouth in the morning, at noon, and at bedtime.), Disp: 60 tablet, Rfl: 2   potassium chloride  SA (KLOR-CON  M) 20 MEQ tablet, Take 20 mEq by mouth See admin instructions. Sometimes the patient needs and sometimes she doesn't. [name] instructed by the  dialysis center on when to take it. (Patient not taking: Reported on 06/01/2023), Disp: , Rfl:    prazosin  (MINIPRESS ) 2 MG capsule, Take 1 capsule (2 mg total) by mouth at bedtime. (Patient not taking: Reported on 03/11/2023), Disp: 30 capsule, Rfl: 5   pregabalin  (LYRICA ) 100 MG capsule, Take 100 mg by mouth 3 (three) times daily. (Patient not taking: Reported on 03/11/2023), Disp: , Rfl:   Allergies: Allergies  Allergen Reactions   Judith Novak ] Other (See Comments)    GI bleeds   Hydrocodone  Shortness Of Breath and Hives   Latuda [Lurasidone Hcl] Anaphylaxis   Magnesium -Containing Compounds Anaphylaxis and Other (See Comments)    Tolerated Ensure   Prednisone  Anaphylaxis, Swelling and Other (See Comments)    Swelling of tongue, lips, throat   Ultram  [Tramadol ] Anaphylaxis, Swelling and Rash   Codeine Nausea And Vomiting and Rash   Desyrel  [Trazodone ] Other (See Comments)    Paranoia    Hydralazine  Other (See Comments)    Muscle spasms   Peanut (Diagnostic) Hives and Other (See Comments)    Raw peanuts only  OK to eat peanut butter.   Peanut-Containing Drug Products Hives    Raw peanuts, but can eat peanut butter   Topamax  [Topiramate ] Other (See Comments)    Paranoia   Sulfa Antibiotics Itching   Tape Rash    [name], NP

## 2023-06-04 NOTE — ED Notes (Signed)
 PTAR has been scheduled for the patient to return home.  No ETA could be provided. Per dispatch, it will be a bit before a drive arrives to pick up the patient. The RN was made aware of ETA.

## 2023-06-04 NOTE — ED Provider Notes (Signed)
 Emergency Medicine Observation Re-evaluation Note  Tiffany [name] is a 63 y.o. female, seen on rounds today.  Pt initially presented to the ED for complaints of Dysuria, Wound Check, and Mental Health Problem 63 y.o. female presents to [facility] hospital on 05/31/2023 with painful urination, paranoia, hallucinations and homicidal ideations. PMH ESRD, OSA, HTN, CHF, COPD, DM2, CKD, PTSD, gout, seizures, CVA, anoxic brain injury, peripheral neuropathy, schizoaffective disorder, MI x2, HLD, and drug abuse (cocaine).  She has been medically cleared.  ESRD MWF, due for dialysis today and being seen by Nephrology.  Awaiting inpatient care for paranoid schizophrenia.  Ate breakfast this AM. Reports she is trying to get better and then her mother gets in the way. Physical Exam  BP 131/81 (BP Location: Right Arm)   Pulse 77   Temp 98 F (36.7 C) (Oral)   Resp 18   Ht 5\' 3"  (1.6 m)   Wt 82.4 kg   SpO2 94%   BMI 32.18 kg/m  Physical Exam General: NAD Cardiac: RR Lungs: even unlabored Psych: NAD  ED Course / MDM  EKG:   I have reviewed the labs performed to date as well as medications administered while in observation.  Recent changes in the last 24 hours include none.  Plan  Current plan is for inpatient placement.    Scarlette Currier, MD 06/04/23 [phone]

## 2023-06-04 NOTE — ED Notes (Signed)
 Patient took medication out of her bag and would not allow nurse see what she was taking.

## 2023-06-05 LAB — HEPATITIS B SURFACE ANTIBODY, QUANTITATIVE: Hep B S AB Quant (Post): 3339 m[IU]/mL

## 2023-06-05 NOTE — Progress Notes (Signed)
 Late Note Entry- Jun 05, 2023  Contacted [facility] this morning to be advised that pt was cleared by psychiatry and d/c to home from ED yesterday. Clinic advised pt should resume care tomorrow.   Lauraine Polite Renal Navigator [phone]

## 2023-06-05 NOTE — Discharge Planning (Signed)
 [facility] Patient Discharge Orders - [facility] CLINIC: [place] MWF  Patient's name: Tiffany [name] Admit/DC Dates: 05/31/2023 - 06/04/2023  DISCHARGE DIAGNOSES: Dysuria: patient reported having dysuria over the past couple days Sores on coccyx: patient is wheelchair bound Psychiatric problems: patient reports that she doesn;t feel safe. Recent HI and more aggression for months  HD ORDER CHANGES: Heparin  change: no heparin  EDW Change: yes New EDW: [address] Change: no  ANEMIA MANAGEMENT: Aranesp : Given: no ESA dose for discharge: mircera per protocol IV Iron  dose at discharge: per protocol Transfusion: Given: no  BONE/MINERAL MEDICATIONS: Hectorol/Calcitriol  change: per protocol Sensipar/Parsabiv change: per protocol  ACCESS INTERVENTION/CHANGE: no change Details: [facility]   RECENT LABS: Recent Labs  Lab 06/04/23 0817 06/04/23 0900  HGB 11.4*  --   NA  --  131*  K  --  3.8  CALCIUM   --  9.1  PHOS  --  4.7*  ALBUMIN   --  3.0*    IV ANTIBIOTICS: no Details:  OTHER ANTICOAGULATION: On Coumadin?: no Last INR: Managed By:  OTHER/APPTS/LAB ORDERS: Please update labs when patient gets to HD  D/C Meds to be reconciled by nurse after every discharge.  Completed By: Hersey Lorenzo, NP-C   Reviewed by: MD:______ RN_______

## 2023-06-08 IMAGING — XA Imaging study
2 series · 2 of 2 positions shown · non-contrast
Comparison: none

CLINICAL DATA: Chronic bilateral low back pain and sciatica.
Prednisone allergy. Transitional sacralized S1 level.

[Series 1: ortho standard · 1 of 1 slices shown (1 of 2)]
[im 1/1]
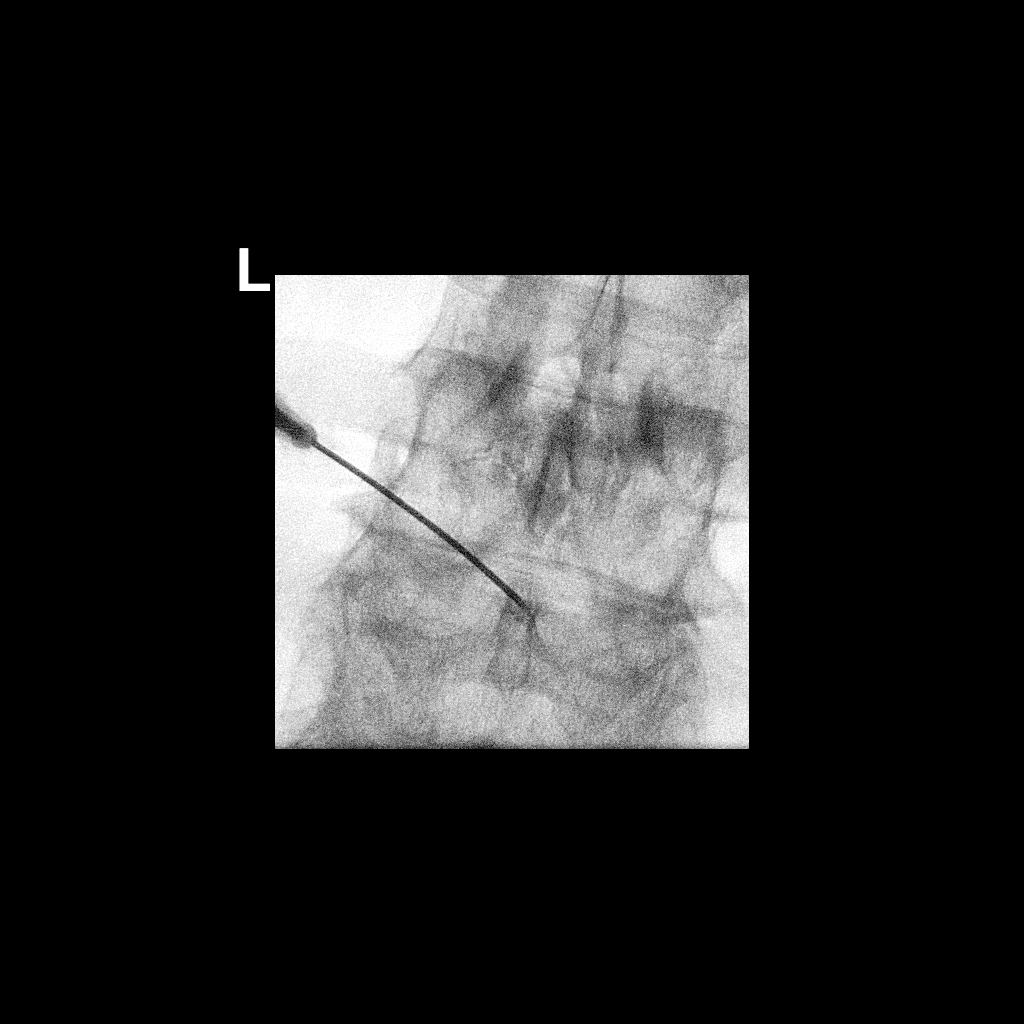

[Series 2: ortho standard · 1 of 1 slices shown (2 of 2)]
[im 1/1]
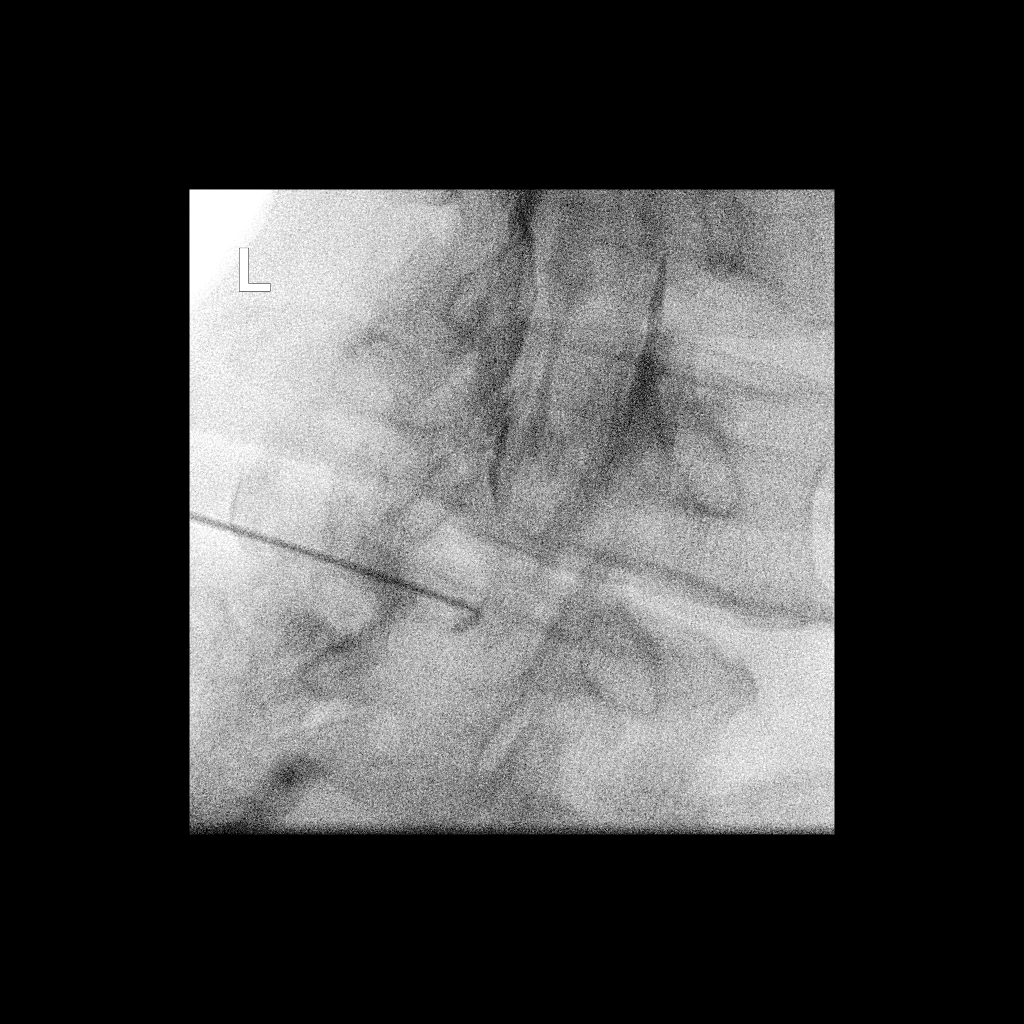

[2 of 2 positions shown; findings below may reference images not displayed]

EXAM:
LUMBAR EPIDURAL INJECTION:

DIAGNOSTIC EPIDURAL INJECTION:

THERAPEUTIC EPIDURAL INJECTION:

PROCEDURE:
The procedure, risks, benefits, and alternatives were explained to
the patient. Questions regarding the procedure were encouraged and
answered. The patient understands and consents to the procedure.

An interlaminar approach was performed on left at L5-S1 directed to
the midline. The overlying skin was cleansed and anesthetized. A 20
gauge epidural needle was advanced using loss-of-resistance
technique.

Injection of Isovue-M 200 shows a good epidural pattern with spread
above and below the level of needle placement, primarily on the left
and crossing the midline at the L5 level. No vascular opacification
is seen.

12 MG CELESTONE mixed with 2ml lidocaine 1% were instilled. The
procedure was well-tolerated, and the patient was discharged thirty
minutes following the injection in good condition.

FLUOROSCOPY TIME:  33 seconds; [REDACTED] DAP

COMPLICATIONS:
None immediate
IMPRESSION: Technically successful epidural injection on the left  at L5-S1.

## 2023-06-13 ENCOUNTER — Emergency Department (HOSPITAL_COMMUNITY)
Admission: EM | Admit: 2023-06-13 | Discharge: 2023-06-14 | Disposition: A | Discharge status: Home / Self Care | Attending: Emergency Medicine | Admitting: Emergency Medicine

## 2023-06-13 ENCOUNTER — Encounter (HOSPITAL_COMMUNITY): Payer: Self-pay | Admitting: Emergency Medicine

## 2023-06-13 ENCOUNTER — Other Ambulatory Visit: Payer: Self-pay

## 2023-06-13 ENCOUNTER — Emergency Department (HOSPITAL_COMMUNITY)

## 2023-06-13 DIAGNOSIS — R600 Localized edema: Secondary | ICD-10-CM | POA: Insufficient documentation

## 2023-06-13 DIAGNOSIS — Z992 Dependence on renal dialysis: Secondary | ICD-10-CM | POA: Diagnosis not present

## 2023-06-13 DIAGNOSIS — Z91010 Allergy to peanuts: Secondary | ICD-10-CM | POA: Diagnosis not present

## 2023-06-13 DIAGNOSIS — N186 End stage renal disease: Secondary | ICD-10-CM | POA: Insufficient documentation

## 2023-06-13 DIAGNOSIS — E876 Hypokalemia: Secondary | ICD-10-CM | POA: Diagnosis not present

## 2023-06-13 MED ORDER — CHLORHEXIDINE GLUCONATE CLOTH 2 % EX PADS: 6.0000 | MEDICATED_PAD | Freq: Every day | CUTANEOUS | Status: DC

## 2023-06-13 MED ORDER — SODIUM CHLORIDE 0.9 % IV BOLUS
250.0000 mL | Freq: Once | INTRAVENOUS | Status: AC
  Administered 2023-06-14: 250 mL via INTRAVENOUS

## 2023-06-13 NOTE — ED Triage Notes (Signed)
 Pt BIB EMS from home with c/o bilateral leg edema. Has missed 2 weeks of dialysis.

## 2023-06-13 NOTE — ED Provider Triage Note (Signed)
 Emergency Medicine Provider Triage Evaluation Note  Tiffany [name] , a 64 y.o. female  was evaluated in triage.  Pt complains of bilateral leg swelling after missing 2 weeks of dialysis.  Patient states she missed dialysis because she was unhappy with her current facility.  She currently denies shortness of breath, chest pain, abdominal pain.  She states she does still make a small amount of urine.  Review of Systems  Positive:  Negative:   Physical Exam  BP 108/73 (BP Location: Right Arm)   Pulse 73   Temp 98.2 F (36.8 C) (Oral)   Resp 14   Ht 5\' 3"  (1.6 m)   Wt 82.4 kg   SpO2 100%   BMI 32.18 kg/m  Gen:   Awake, no distress   Resp:  Normal effort  MSK:   Moves extremities without difficulty  Other:    Medical Decision Making  Medically screening exam initiated at 10:48 PM.  Appropriate orders placed.  Tiffany [name] was informed that the remainder of the evaluation will be completed by another provider, this initial triage assessment does not replace that evaluation, and the importance of remaining in the ED until their evaluation is complete.     Elisa Guest, PA-C 06/13/23 2248

## 2023-06-14 ENCOUNTER — Encounter (HOSPITAL_COMMUNITY): Payer: Self-pay | Admitting: Nephrology

## 2023-06-14 LAB — CBC
HCT: 35.9 % — ABNORMAL LOW (ref 36.0–46.0)
Hemoglobin: 11.2 g/dL — ABNORMAL LOW (ref 12.0–15.0)
MCH: 29.6 pg (ref 26.0–34.0)
MCHC: 31.2 g/dL (ref 30.0–36.0)
MCV: 95 fL (ref 80.0–100.0)
Platelets: 286 10*3/uL (ref 150–400)
RBC: 3.78 MIL/uL — ABNORMAL LOW (ref 3.87–5.11)
RDW: 14.8 % (ref 11.5–15.5)
WBC: 6.6 10*3/uL (ref 4.0–10.5)
nRBC: 0 % (ref 0.0–0.2)

## 2023-06-14 LAB — CBC WITH DIFFERENTIAL/PLATELET
Abs Immature Granulocytes: 0.03 10*3/uL (ref 0.00–0.07)
Basophils Absolute: 0 10*3/uL (ref 0.0–0.1)
Basophils Relative: 0 %
Eosinophils Absolute: 0.1 10*3/uL (ref 0.0–0.5)
Eosinophils Relative: 1 %
HCT: 33.4 % — ABNORMAL LOW (ref 36.0–46.0)
Hemoglobin: 10.7 g/dL — ABNORMAL LOW (ref 12.0–15.0)
Immature Granulocytes: 0 %
Lymphocytes Relative: 13 %
Lymphs Abs: 1.2 10*3/uL (ref 0.7–4.0)
MCH: 30.2 pg (ref 26.0–34.0)
MCHC: 32 g/dL (ref 30.0–36.0)
MCV: 94.4 fL (ref 80.0–100.0)
Monocytes Absolute: 0.8 10*3/uL (ref 0.1–1.0)
Monocytes Relative: 9 %
Neutro Abs: 6.7 10*3/uL (ref 1.7–7.7)
Neutrophils Relative %: 77 %
Platelets: 256 10*3/uL (ref 150–400)
RBC: 3.54 MIL/uL — ABNORMAL LOW (ref 3.87–5.11)
RDW: 14.6 % (ref 11.5–15.5)
WBC: 8.8 10*3/uL (ref 4.0–10.5)
nRBC: 0 % (ref 0.0–0.2)

## 2023-06-14 LAB — I-STAT CHEM 8, ED
BUN: 95 mg/dL — ABNORMAL HIGH (ref 8–23)
Calcium, Ion: 1.03 mmol/L — ABNORMAL LOW (ref 1.15–1.40)
Chloride: 104 mmol/L (ref 98–111)
Creatinine, Ser: 8 mg/dL — ABNORMAL HIGH (ref 0.44–1.00)
Glucose, Bld: 90 mg/dL (ref 70–99)
HCT: 35 % — ABNORMAL LOW (ref 36.0–46.0)
Hemoglobin: 11.9 g/dL — ABNORMAL LOW (ref 12.0–15.0)
Potassium: 2.7 mmol/L — CL (ref 3.5–5.1)
Sodium: 137 mmol/L (ref 135–145)
TCO2: 22 mmol/L (ref 22–32)

## 2023-06-14 LAB — COMPREHENSIVE METABOLIC PANEL WITH GFR
ALT: 12 U/L (ref 0–44)
AST: 19 U/L (ref 15–41)
Albumin: 2.9 g/dL — ABNORMAL LOW (ref 3.5–5.0)
Alkaline Phosphatase: 72 U/L (ref 38–126)
Anion gap: 17 — ABNORMAL HIGH (ref 5–15)
BUN: 99 mg/dL — ABNORMAL HIGH (ref 8–23)
CO2: 19 mmol/L — ABNORMAL LOW (ref 22–32)
Calcium: 9.3 mg/dL (ref 8.9–10.3)
Chloride: 101 mmol/L (ref 98–111)
Creatinine, Ser: 8.01 mg/dL — ABNORMAL HIGH (ref 0.44–1.00)
GFR, Estimated: 5 mL/min — ABNORMAL LOW (ref >60)
Glucose, Bld: 91 mg/dL (ref 70–99)
Potassium: 2.7 mmol/L — CL (ref 3.5–5.1)
Sodium: 137 mmol/L (ref 135–145)
Total Bilirubin: 0.5 mg/dL (ref 0.0–1.2)
Total Protein: 6.4 g/dL — ABNORMAL LOW (ref 6.5–8.1)

## 2023-06-14 LAB — MAGNESIUM: Magnesium: 1.8 mg/dL (ref 1.7–2.4)

## 2023-06-14 MED ORDER — LIDOCAINE HCL (PF) 1 % IJ SOLN: 5.0000 mL | INTRAMUSCULAR | Status: DC | PRN

## 2023-06-14 MED ORDER — PENTAFLUOROPROP-TETRAFLUOROETH EX AERO: 1.0000 | INHALATION_SPRAY | CUTANEOUS | Status: DC | PRN

## 2023-06-14 MED ORDER — CHLORHEXIDINE GLUCONATE CLOTH 2 % EX PADS: 6.0000 | MEDICATED_PAD | Freq: Every day | CUTANEOUS | Status: DC

## 2023-06-14 MED ORDER — ALTEPLASE 2 MG IJ SOLR: 2.0000 mg | Freq: Once | INTRAMUSCULAR | Status: DC | PRN

## 2023-06-14 MED ORDER — LIDOCAINE-PRILOCAINE 2.5-2.5 % EX CREA: 1.0000 | TOPICAL_CREAM | CUTANEOUS | Status: DC | PRN

## 2023-06-14 MED ORDER — HEPARIN SODIUM (PORCINE) 1000 UNIT/ML DIALYSIS
1000.0000 [IU] | INTRAMUSCULAR | Status: DC | PRN
Start: 2023-06-14 — End: 2023-06-14
  Administered 2023-06-14: 3200 [IU]
  Filled 2023-06-14: qty 1

## 2023-06-14 MED ORDER — CARVEDILOL 12.5 MG PO TABS
12.5000 mg | ORAL_TABLET | Freq: Two times a day (BID) | ORAL | Status: DC
  Administered 2023-06-14: 12.5 mg via ORAL
  Filled 2023-06-14: qty 1

## 2023-06-14 MED ORDER — HEPARIN SODIUM (PORCINE) 1000 UNIT/ML IJ SOLN
INTRAMUSCULAR | Status: AC
  Filled 2023-06-14: qty 1

## 2023-06-14 MED ORDER — AMLODIPINE BESYLATE 5 MG PO TABS
10.0000 mg | ORAL_TABLET | Freq: Every day | ORAL | Status: DC
  Administered 2023-06-14: 10 mg via ORAL
  Filled 2023-06-14: qty 1

## 2023-06-14 MED ORDER — POTASSIUM CHLORIDE CRYS ER 20 MEQ PO TBCR
80.0000 meq | EXTENDED_RELEASE_TABLET | Freq: Once | ORAL | Status: AC
  Administered 2023-06-14: 80 meq via ORAL
  Filled 2023-06-14: qty 4

## 2023-06-14 MED ORDER — ANTICOAGULANT SODIUM CITRATE 4% (200MG/5ML) IV SOLN: 5.0000 mL | Status: DC | PRN

## 2023-06-14 NOTE — Consult Note (Signed)
 Renal Service Consult Note [facility]  KAIREE KOZMA 06/14/2023 Lynae Sandifer, MD Requesting Physician: Dr. Morris Arch  Reason for Consult: ESRD patient has missed 2 weeks of dialysis HPI: The patient is a 63 y.o. year-old w/ PMH as below who presented to ED complaining about bilateral lower extremity swelling.  Patient says she has not gone to dialysis for the last 2 weeks.  She stated that she "does not like the way things are done there" and refuses to go back to her dialysis center.  In the ED  blood pressure 110/73, HR 73, RR 14, temp 98.2, O2 100% O2 sat on room air. Labs showed K+ 2.7, BUN 95, creat 8.00, Hb 10.7, WBC 8K. Not sure if patient is to be admitted. We are asked to see for dialysis.    Pt seen in the ED hallway.  Main complaint is she feels like she is not being treated like she wants to be at her dialysis unit.  She states she has talked to her dialysis providers and requested transfer.  She denies any shortness of breath, orthopnea or PND.   ROS - denies CP, no joint pain, no HA, no blurry vision, no rash, no diarrhea, no nausea/ vomiting  PMH: Acute GI bleed Metabolic encephalopathy Chronic diastolic heart failure Aggressive behavior Agitation Anxiety Asthma Cardiac arrest PTSD Chronic pain syndrome Chronic respiratory failure with hypoxia and hypercapnia COPD Depression Diabetes type 2 E. coli UTI high Hypertension ESRD on dialysis Gout GI bleed HL Home oxygen  therapy OSA History of stroke   Past Surgical History  Past Surgical History:  Procedure Laterality Date   A/V FISTULAGRAM N/A 02/01/2023   Procedure: A/V Fistulagram;  Surgeon: Baron Border, MD;  Location: [facility] INVASIVE CV LAB;  Service: Cardiovascular;  Laterality: N/A;   AV FISTULA PLACEMENT Left 12/21/2022   Procedure: INSERTION OF LEFT ARM ARTERIOVENOUS (AV) GORE-TEX GRAFT;  Surgeon: Carlene Che, MD;  Location: [facility] OR;  Service: Vascular;  Laterality: Left;    CESAREAN SECTION  1997   COLONOSCOPY WITH PROPOFOL  N/A 04/01/2021   Procedure: COLONOSCOPY WITH PROPOFOL ;  Surgeon: Alvis Jourdain, MD;  Location: [facility] ENDOSCOPY;  Service: Gastroenterology;  Laterality: N/A;  Rectal bleeding with drop in hemoglobin to 7.2 g/dL   HERNIA REPAIR     IR CHOLANGIOGRAM EXISTING TUBE  07/20/2016   IR PERC CHOLECYSTOSTOMY  05/10/2016   IR RADIOLOGIST EVAL & MGMT  06/08/2016   IR RADIOLOGIST EVAL & MGMT  06/29/2016   IR SINUS/FIST TUBE CHK-NON GI  07/12/2016   RIGHT/LEFT HEART CATH AND CORONARY ANGIOGRAPHY N/A 06/19/2017   Procedure: RIGHT/LEFT HEART CATH AND CORONARY ANGIOGRAPHY;  Surgeon: Mardell Shade, MD;  Location: [facility] INVASIVE CV LAB;  Service: Cardiovascular;  Laterality: N/A;   TIBIA IM NAIL INSERTION Right 07/12/2016   Procedure: INTRAMEDULLARY (IM) NAIL RIGHT TIBIA;  Surgeon: Wes Hamman, MD;  Location: [facility] OR;  Service: Orthopedics;  Laterality: Right;   UMBILICAL HERNIA REPAIR  ~ 1963   "that's why I don't have a belly button"   VAGINAL HYSTERECTOMY     Family History  Family History  Problem Relation Age of Onset   Cancer Mother        lung   Depression Mother    Cancer Father        prostate   Depression Sister    Anxiety disorder Sister    Schizophrenia Sister    Bipolar disorder Sister    Depression Sister    Depression  Brother    Heart failure Other        cousin   Social History  reports that she quit smoking about 7 years ago. Her smoking use included cigarettes. She started smoking about 46 years ago. She has a 58.6 pack-year smoking history. She has been exposed to tobacco smoke. She has never used smokeless tobacco. She reports that she does not currently use drugs after having used the following drugs: Cocaine. She reports that she does not drink alcohol. Allergies  Allergies  Allergen Reactions   Marletta Simmering ] Other (See Comments)    GI bleeds   Hydrocodone  Shortness Of Breath and Hives   Latuda [Lurasidone Hcl] Anaphylaxis    Magnesium -Containing Compounds Anaphylaxis and Other (See Comments)    Tolerated Ensure   Prednisone  Anaphylaxis, Swelling and Other (See Comments)    Swelling of tongue, lips, throat   Ultram  [Tramadol ] Anaphylaxis, Swelling and Rash   Codeine Nausea And Vomiting and Rash   Desyrel  [Trazodone ] Other (See Comments)    Paranoia    Hydralazine  Other (See Comments)    Muscle spasms   Peanut (Diagnostic) Hives and Other (See Comments)    Raw peanuts only  OK to eat peanut butter.   Peanut-Containing Drug Products Hives    Raw peanuts, but can eat peanut butter   Topamax  [Topiramate ] Other (See Comments)    Paranoia   Sulfa Antibiotics Itching   Tape Rash   Home medications Prior to Admission medications   Medication Sig Start Date End Date Taking? Authorizing Provider  albuterol  (VENTOLIN  HFA) 108 (90 Base) MCG/ACT inhaler INHALE 2 PUFFS INTO THE LUNGS EVERY 6 HOURS AS NEEDED FOR WHEEZING OR SHORTNESS OF BREATH 10/24/22   Lavada Porteous, DO  amLODipine  (NORVASC ) 10 MG tablet Take 1 tablet (10 mg total) by mouth daily. 02/26/23     Asenapine  Maleate (SAPHRIS ) 10 MG SUBL Place 1 tablet under the tongue daily.    [provider]  atorvastatin  (LIPITOR ) 20 MG tablet Take 1 tablet (20 mg total) by mouth daily. 10/09/22   McDiarmid, Demetra Filter, MD  calcitRIOL  (ROCALTROL ) 0.5 MCG capsule Take 1 capsule (0.5 mcg total) by mouth daily. 10/09/22   McDiarmid, Demetra Filter, MD  carvedilol  (COREG ) 12.5 MG tablet Take 1 tablet (12.5 mg total) by mouth 2 (two) times daily. 02/26/23     cephALEXin  (KEFLEX ) 500 MG capsule Take 1 capsule (500 mg total) by mouth 2 (two) times daily. 06/04/23   Scarlette Currier, MD  cyclobenzaprine  (FLEXERIL ) 10 MG tablet Take 1 tablet (10 mg total) by mouth 3 (three) times daily. 10/09/22   McDiarmid, Demetra Filter, MD  dicyclomine  (BENTYL ) 10 MG capsule Take 1 capsule (10 mg total) by mouth 3 (three) times daily before meals. 10/09/22   McDiarmid, Demetra Filter, MD  DULoxetine  (CYMBALTA ) 30  MG capsule Take 1 capsule (30 mg total) by mouth daily. 03/15/23   Volanda Gruber, NP  epoetin  alfa (EPOGEN ) 10000 UNIT/ML injection Inject 1 mL (10,000 Units total) into the vein every Monday, Wednesday, and Friday with hemodialysis. 11/22/22   Juliana Ocean, DO  escitalopram  (LEXAPRO ) 10 MG tablet Take 1 tablet (10 mg total) by mouth daily. 03/15/23   Volanda Gruber, NP  ezetimibe  (ZETIA ) 10 MG tablet Take 1 tablet (10 mg total) by mouth daily. Please call office to schedule an appt for further refills. Thank you 04/27/23   Elmyra Haggard, MD  ferrous sulfate  325 (65 FE) MG tablet Take 1 tablet (325 mg total)  by mouth every other day. Patient taking differently: Take 325 mg by mouth daily. 10/09/22 10/04/23  McDiarmid, Demetra Filter, MD  Fluticasone -Umeclidin-Vilant (TRELEGY ELLIPTA ) 100-62.5-25 MCG/ACT AEPB INHALE 1 PUFF INTO THE LUNGS DAILY 03/20/22   Lavada Porteous, DO  folic acid  (FOLVITE ) 1 MG tablet Take 1 tablet (1 mg total) by mouth daily. 10/09/22   McDiarmid, Demetra Filter, MD  hydrOXYzine  (ATARAX ) 10 MG tablet Take 1 tablet (10 mg total) by mouth 3 (three) times daily as needed for anxiety. 03/14/23   Volanda Gruber, NP  ibuprofen  (ADVIL ) 200 MG tablet Take 400 mg by mouth 3 (three) times daily.    [provider]  lamoTRIgine  (LAMICTAL ) 100 MG tablet Take 1 tablet (100 mg total) by mouth daily. 02/26/23     lidocaine  (LIDODERM ) 5 % Place 1 patch onto the skin daily. Remove & Discard patch within 12 hours or as directed by MD Patient taking differently: Place 1 patch onto the skin daily. Remove & Discard patch within 12 hours or as directed by MD - Left side 02/20/23   Cala Castleman, MD  loperamide  (IMODIUM ) 2 MG capsule Take 1 capsule (2 mg total) by mouth 2 (two) times daily. Patient taking differently: Take 2 mg by mouth 3 (three) times daily. 10/09/22   McDiarmid, Demetra Filter, MD  Melatonin 10 MG CAPS Take 10 mg by mouth at bedtime as needed (Sleep).    [provider]   metolazone  (ZAROXOLYN ) 2.5 MG tablet Take 2.5 mg by mouth every Monday, Wednesday, and Friday. 11/16/22   [provider]  mirtazapine  (REMERON ) 7.5 MG tablet Take 1 tablet (7.5 mg total) by mouth at bedtime. 03/14/23   Volanda Gruber, NP  omeprazole  (PRILOSEC) 40 MG capsule Take 1 capsule (40 mg total) by mouth every morning. 10/09/22   McDiarmid, Demetra Filter, MD  potassium chloride  SA (KLOR-CON  M) 20 MEQ tablet Take 20 mEq by mouth See admin instructions. Sometimes the patient needs and sometimes she doesn't. [name] instructed by the dialysis center on when to take it. Patient not taking: Reported on 06/01/2023    [provider]  prazosin  (MINIPRESS ) 2 MG capsule Take 1 capsule (2 mg total) by mouth at bedtime. Patient not taking: Reported on 03/11/2023 02/26/23     pregabalin  (LYRICA ) 100 MG capsule Take 100 mg by mouth 3 (three) times daily. Patient not taking: Reported on 03/11/2023 02/27/23   [provider]  QUEtiapine  (SEROQUEL ) 50 MG tablet Take 1 tablet (50 mg total) by mouth 2 (two) times daily. Patient taking differently: Take 100 mg by mouth 3 (three) times daily. 03/14/23   Volanda Gruber, NP  torsemide  (DEMADEX ) 20 MG tablet Take 1 tablet (20 mg total) by mouth 2 (two) times daily. Patient taking differently: Take 20 mg by mouth in the morning, at noon, and at bedtime. 02/13/23        Vitals:   06/13/23 2222 06/13/23 2233 06/14/23 0613  BP:  108/73 (!) 159/88  Pulse:  73 69  Resp:  14 18  Temp:  98.2 F (36.8 C) 97.6 F (36.4 C)  TempSrc:  Oral Oral  SpO2:  100% 100%  Weight: 82.4 kg    Height: 5\' 3"  (1.6 m)     Exam Gen alert, no distress No rash, cyanosis or gangrene Sclera anicteric, throat clear  No jvd or bruits Chest clear bilat to bases, no rales/ wheezing RRR no MRG Abd soft ntnd no mass or ascites +bs GU defer MS  no joint effusions or deformity Ext 1-2 bilat  LE edema, no other edema Neuro is alert, Ox 3 , nf    TDC intact       Renal-related home meds: Norvasc  10 every day Coreg  12.5 bid    OP HD: [facility] MWF  4h   B500   82kg   RIJ TDC    Heparin  none Last OP HD 5/14, post wt 85.2kg Come off 1-4 kg over most of the time   Assessment/ Plan: Missed HD: pt has not gone to HD for last 2 wks. Plan HD upstairs today/tonight.  ESRD: on HD MWF. HD as above.  HTN: cont home meds as needed Volume: +sig LE edema, no resp issues. Max UF w/ HD.  Anemia of esrd: Hb 10-12, no esa needs Secondary hyperparathyroidism: CCa in range, add phos, cont any binders ac   Larry Poag  MD [facility] 06/14/2023, 8:45 AM  Recent Labs  Lab 06/13/23 2347 06/14/23 0018  HGB 10.7* 11.9*  ALBUMIN  2.9*  --   CALCIUM  9.3  --   CREATININE 8.01* 8.00*  K 2.7* 2.7*   Inpatient medications:  Chlorhexidine  Gluconate Cloth  6 each Topical Daily

## 2023-06-14 NOTE — Procedures (Signed)
 Received patient in bed to unit.  Alert and oriented.  Informed consent signed and in chart.   TX duration: 3.5 hours  Patient tolerated well.  Transported back to the room  Alert, without acute distress.  Hand-off given to patient's nurse.   Access used: right cath Access issues: none  Total UF removed: 1.2 liters Medication(s) given: norvasc , coreg , heparin   Clover Dao, RN [facility] D[facility] Unit

## 2023-06-14 NOTE — Procedures (Signed)
 UF turned back off due to c/o cramping.

## 2023-06-14 NOTE — ED Provider Notes (Addendum)
 Patient back from dialysis.  [name] well-appearing.  Unremarkable vitals.  No signs of respiratory distress.  She continued to talk with me about her displeasure with her outpatient dialysis center.  Ultimately after long discussion with her and talking with Dr. Jearldine Mina with nephrology there is really not much more we can offer her at this time.  She does not meet any criteria for admission.  Is not in any respiratory distress.  I strongly as well as nephrology team recommended that she talk with her outpatient doctors to find a new dialysis center.  Patient discharged.  I understand her frustration but at the same time there is not an emergent or medical need at this time for admission or any other further workup.  She requested to try to stay here overnight but it is not clinically indicated at this time.  Also looking at social work notes they offered to help her and gave her some options but she declined.   Lowery Rue, DO 06/14/23 1846    Lowery Rue, DO 06/14/23 [phone]

## 2023-06-14 NOTE — Procedures (Signed)
UF turned back on.

## 2023-06-14 NOTE — ED Provider Notes (Signed)
 [place] EMERGENCY DEPARTMENT AT [facility] Provider Note   CSN: [id number] Arrival date & time: 06/13/23  2220     History  Chief Complaint  Patient presents with   Leg Swelling    Tiffany [name] is a 63 y.o. female.  Patient with past medical history significant for ESRD on hemodialysis, manage depression, anxiety, GERD, stroke presents to the emergency room complaining of bilateral lower extremity edema.  The patient states that she has not gone to dialysis for the past 2 weeks.  She states she does not like the way things are "done they are" and refuses to go back to her dialysis center.  She does still produce some urine on her own.  The patient denies shortness of breath, abdominal pain, nausea, vomiting, chest pain.  HPI     Home Medications Prior to Admission medications   Medication Sig Start Date End Date Taking? Authorizing Provider  albuterol  (VENTOLIN  HFA) 108 (90 Base) MCG/ACT inhaler INHALE 2 PUFFS INTO THE LUNGS EVERY 6 HOURS AS NEEDED FOR WHEEZING OR SHORTNESS OF BREATH 10/24/22   Lavada Porteous, DO  amLODipine  (NORVASC ) 10 MG tablet Take 1 tablet (10 mg total) by mouth daily. 02/26/23     Asenapine  Maleate (SAPHRIS ) 10 MG SUBL Place 1 tablet under the tongue daily.    [provider]  atorvastatin  (LIPITOR ) 20 MG tablet Take 1 tablet (20 mg total) by mouth daily. 10/09/22   McDiarmid, Demetra Filter, MD  calcitRIOL  (ROCALTROL ) 0.5 MCG capsule Take 1 capsule (0.5 mcg total) by mouth daily. 10/09/22   McDiarmid, Demetra Filter, MD  carvedilol  (COREG ) 12.5 MG tablet Take 1 tablet (12.5 mg total) by mouth 2 (two) times daily. 02/26/23     cephALEXin  (KEFLEX ) 500 MG capsule Take 1 capsule (500 mg total) by mouth 2 (two) times daily. 06/04/23   Scarlette Currier, MD  cyclobenzaprine  (FLEXERIL ) 10 MG tablet Take 1 tablet (10 mg total) by mouth 3 (three) times daily. 10/09/22   McDiarmid, Demetra Filter, MD  dicyclomine  (BENTYL ) 10 MG capsule Take 1 capsule (10 mg total) by mouth 3  (three) times daily before meals. 10/09/22   McDiarmid, Demetra Filter, MD  DULoxetine  (CYMBALTA ) 30 MG capsule Take 1 capsule (30 mg total) by mouth daily. 03/15/23   Volanda Gruber, NP  epoetin  alfa (EPOGEN ) 10000 UNIT/ML injection Inject 1 mL (10,000 Units total) into the vein every Monday, Wednesday, and Friday with hemodialysis. 11/22/22   Juliana Ocean, DO  escitalopram  (LEXAPRO ) 10 MG tablet Take 1 tablet (10 mg total) by mouth daily. 03/15/23   Volanda Gruber, NP  ezetimibe  (ZETIA ) 10 MG tablet Take 1 tablet (10 mg total) by mouth daily. Please call office to schedule an appt for further refills. Thank you 04/27/23   Elmyra Haggard, MD  ferrous sulfate  325 (65 FE) MG tablet Take 1 tablet (325 mg total) by mouth every other day. Patient taking differently: Take 325 mg by mouth daily. 10/09/22 10/04/23  McDiarmid, Demetra Filter, MD  Fluticasone -Umeclidin-Vilant (TRELEGY ELLIPTA ) 100-62.5-25 MCG/ACT AEPB INHALE 1 PUFF INTO THE LUNGS DAILY 03/20/22   Lavada Porteous, DO  folic acid  (FOLVITE ) 1 MG tablet Take 1 tablet (1 mg total) by mouth daily. 10/09/22   McDiarmid, Demetra Filter, MD  hydrOXYzine  (ATARAX ) 10 MG tablet Take 1 tablet (10 mg total) by mouth 3 (three) times daily as needed for anxiety. 03/14/23   Volanda Gruber, NP  ibuprofen  (ADVIL ) 200 MG tablet Take 400 mg by mouth  3 (three) times daily.    [provider]  lamoTRIgine  (LAMICTAL ) 100 MG tablet Take 1 tablet (100 mg total) by mouth daily. 02/26/23     lidocaine  (LIDODERM ) 5 % Place 1 patch onto the skin daily. Remove & Discard patch within 12 hours or as directed by MD Patient taking differently: Place 1 patch onto the skin daily. Remove & Discard patch within 12 hours or as directed by MD - Left side 02/20/23   Cala Castleman, MD  loperamide  (IMODIUM ) 2 MG capsule Take 1 capsule (2 mg total) by mouth 2 (two) times daily. Patient taking differently: Take 2 mg by mouth 3 (three) times daily. 10/09/22   McDiarmid, Demetra Filter, MD  Melatonin 10 MG  CAPS Take 10 mg by mouth at bedtime as needed (Sleep).    [provider]  metolazone  (ZAROXOLYN ) 2.5 MG tablet Take 2.5 mg by mouth every Monday, Wednesday, and Friday. 11/16/22   [provider]  mirtazapine  (REMERON ) 7.5 MG tablet Take 1 tablet (7.5 mg total) by mouth at bedtime. 03/14/23   Volanda Gruber, NP  omeprazole  (PRILOSEC) 40 MG capsule Take 1 capsule (40 mg total) by mouth every morning. 10/09/22   McDiarmid, Demetra Filter, MD  potassium chloride  SA (KLOR-CON  M) 20 MEQ tablet Take 20 mEq by mouth See admin instructions. Sometimes the patient needs and sometimes she doesn't. [name] instructed by the dialysis center on when to take it. Patient not taking: Reported on 06/01/2023    [provider]  prazosin  (MINIPRESS ) 2 MG capsule Take 1 capsule (2 mg total) by mouth at bedtime. Patient not taking: Reported on 03/11/2023 02/26/23     pregabalin  (LYRICA ) 100 MG capsule Take 100 mg by mouth 3 (three) times daily. Patient not taking: Reported on 03/11/2023 02/27/23   [provider]  QUEtiapine  (SEROQUEL ) 50 MG tablet Take 1 tablet (50 mg total) by mouth 2 (two) times daily. Patient taking differently: Take 100 mg by mouth 3 (three) times daily. 03/14/23   Volanda Gruber, NP  torsemide  (DEMADEX ) 20 MG tablet Take 1 tablet (20 mg total) by mouth 2 (two) times daily. Patient taking differently: Take 20 mg by mouth in the morning, at noon, and at bedtime. 02/13/23         Allergies    Asa [aspirin ], Hydrocodone , Latuda [lurasidone hcl], Magnesium -containing compounds, Prednisone , Ultram  [tramadol ], Codeine, Desyrel  [trazodone ], Hydralazine , Peanut (diagnostic), Peanut-containing drug products, Topamax  [topiramate ], Sulfa antibiotics, and Tape    Review of Systems   Review of Systems  Physical Exam Updated Vital Signs BP 108/73 (BP Location: Right Arm)   Pulse 73   Temp 98.2 F (36.8 C) (Oral)   Resp 14   Ht 5\' 3"  (1.6 m)   Wt 82.4 kg   SpO2 100%   BMI  32.18 kg/m  Physical Exam Vitals and nursing note reviewed.  Constitutional:      General: [name] not in acute distress.    Appearance: [name] well-developed.  HENT:     Head: Normocephalic and atraumatic.  Eyes:     General: No scleral icterus. Cardiovascular:     Rate and Rhythm: Normal rate and regular rhythm.  Pulmonary:     Effort: Pulmonary effort is normal. No respiratory distress.     Breath sounds: Normal breath sounds.  Abdominal:     Palpations: Abdomen is soft.     Tenderness: There is no abdominal tenderness.  Musculoskeletal:        General:  No swelling.     Cervical back: Neck supple.     Right lower leg: Edema present.     Left lower leg: Edema present.  Skin:    General: Skin is warm and dry.     Capillary Refill: Capillary refill takes less than 2 seconds.  Neurological:     Mental Status: [name] alert.  Psychiatric:        Mood and Affect: Mood normal.     ED Results / Procedures / Treatments   Labs (all labs ordered are listed, but only abnormal results are displayed) Labs Reviewed  COMPREHENSIVE METABOLIC PANEL WITH GFR - Abnormal; Notable for the following components:      Result Value   Potassium 2.7 (*)    CO2 19 (*)    BUN 99 (*)    Creatinine, Ser 8.01 (*)    Total Protein 6.4 (*)    Albumin  2.9 (*)    GFR, Estimated 5 (*)    Anion gap 17 (*)    All other components within normal limits  CBC WITH DIFFERENTIAL/PLATELET - Abnormal; Notable for the following components:   RBC 3.54 (*)    Hemoglobin 10.7 (*)    HCT 33.4 (*)    All other components within normal limits  I-STAT CHEM 8, ED - Abnormal; Notable for the following components:   Potassium 2.7 (*)    BUN 95 (*)    Creatinine, Ser 8.00 (*)    Calcium , Ion 1.03 (*)    Hemoglobin 11.9 (*)    HCT 35.0 (*)    All other components within normal limits  MAGNESIUM     EKG None  Radiology DG Chest 2 View Result Date: 06/13/2023 CLINICAL DATA:  Missed dialysis EXAM: CHEST - 2  VIEW COMPARISON:  03/10/2023 FINDINGS: Right-sided central venous catheter tip at the right atrium. Stable cardiomediastinal silhouette. Mild central vascular congestion. No overt edema or sizable pleural effusion. No pneumothorax IMPRESSION: Mild central vascular congestion. Electronically Signed   By: Esmeralda Hedge M.D.   On: 06/13/2023 23:17    Procedures Procedures    Medications Ordered in ED Medications  Chlorhexidine  Gluconate Cloth 2 % PADS 6 each (has no administration in time range)  sodium chloride  0.9 % bolus 250 mL (0 mLs Intravenous Stopped 06/14/23 0200)  potassium chloride  SA (KLOR-CON  M) CR tablet 80 mEq (80 mEq Oral Given 06/14/23 0155)    ED Course/ Medical Decision Making/ A&P                                 Medical Decision Making Amount and/or Complexity of Data Reviewed Labs: ordered. Radiology: ordered.  Risk OTC drugs. Prescription drug management.   This patient presents to the ED for concern of bilateral lower extremity swelling, this involves an extensive number of treatment options, and is a complaint that carries with it a high risk of complications and morbidity.  The differential diagnosis includes fluid overload   Co morbidities / Chronic conditions that complicate the patient evaluation  ESRD, missed dialysis   Additional history obtained:  Additional history obtained from EMR External records from outside source obtained and reviewed including recent inpatient notes showing patient did receive dialysis approximately 11 days ago in the hospital   Lab Tests:  I Ordered, and personally interpreted labs.  The pertinent results include: Potassium 2.7   Imaging Studies ordered:  I ordered imaging studies including chest x-ray I  independently visualized and interpreted imaging which showed mild central vascular congestion I agree with the radiologist interpretation   Cardiac Monitoring: / EKG:  The patient was maintained on a cardiac  monitor.  I personally viewed and interpreted the cardiac monitored which showed an underlying rhythm of: Normal sinus rhythm   Problem List / ED Course / Critical interventions / Medication management   I ordered medication including potassium for hypokalemia Reevaluation of the patient after these medicines showed that the patient stayed the same I have reviewed the patients home medicines and have made adjustments as needed   Consultations Obtained:  I requested consultation with the nephrologist, Dr.Peeples,  and discussed lab and imaging findings as well as pertinent plan - they recommend: dialysis   Social Determinants of Health:  Patient is a former smoker   Test / Admission - Considered:  Patient appears to need hemodialysis.  Will contact nephrology to have patient added to their schedule for hemodialysis session.  Based on lab work do not think this needs to be done emergently but can hopefully be added to the schedule later today.         Final Clinical Impression(s) / ED Diagnoses Final diagnoses:  Peripheral edema  Hemodialysis patient [facility])    Rx / DC Orders ED Discharge Orders     None         Delories Fetter 06/14/23 0551    Lindle Rhea, MD 06/15/23 [phone]

## 2023-06-14 NOTE — Discharge Instructions (Addendum)
 We strongly encourage you to talk with your primary care doctor and your nephrology team tomorrow regarding your outpatient dialysis center.

## 2023-06-14 NOTE — ED Notes (Signed)
 Help get patient cleaned up and bed changed patient is resting

## 2023-06-14 NOTE — ED Provider Notes (Signed)
  Physical Exam  BP (!) 159/88 (BP Location: Left Arm)   Pulse 69   Temp 97.6 F (36.4 C) (Oral)   Resp 18   Ht 5\' 3"  (1.6 m)   Wt 82.4 kg   SpO2 100%   BMI 32.18 kg/m   Physical Exam  Procedures  Procedures  ED Course / MDM   Clinical Course as of 06/14/23 0706  Thu Jun 14, 2023  0650 Needs Ed dialysis.  [JB]    Clinical Course User Index [JB] Hanne Kegg, Kandace Organ, PA-C   Medical Decision Making Amount and/or Complexity of Data Reviewed Labs: ordered. Radiology: ordered.  Risk OTC drugs. Prescription drug management.   Patient with ESRD on dialysis reporting with complain of swelling.  [name] hemodynamically stable.  Her potassium was low but this was supplemented orally.  [name] getting Ed dialysis. She has not been to dialysis in two weeks. Plan for dialysis and discharge.        Eudora Heron, PA-C 06/14/23 1422    Auston Blush, MD 06/14/23 [phone]

## 2023-06-14 NOTE — Progress Notes (Signed)
 Contacted by nephrologist regarding pt's out-pt HD clinic situation. Contacted [facility] and spoke to Tax adviser. Clinic staff have offered assistance to pt with new clinic placement (clinic to clinic transfer) and provided alternate appt schedule options. Pt has declined both. Pt states she will locate a new clinic on her own. This info was provided to nephrologist.   Lauraine Polite Renal Navigator [phone]

## 2023-06-14 NOTE — ED Notes (Addendum)
 Upon taking over for this pt assignment. Pt offered assistance to find a ride home, pt states that she does not need help and has already tried to call someone.  This RN offered multiple times to help pt with a ride. Pt refused and states "I'm not giving you any information about where I live and I will find a ride if I need one." Pt notified that if she needs an ambulance to get home since she has difficulty walking, this is the time to ask so we can get it set up. Pt declined all help  Pt given new paper scrubs and wash cloths to wash up per request and notified that after that she would be pushed out to the lobby.

## 2023-06-14 NOTE — ED Notes (Signed)
 Pt requested to speak with [employer] prior to being discharged. Security and [employer] to speak with pt.  [employer] spoke with pt for 45 min and offered her multiple different modes of transportation home and pt declined. Pt notified that she was still discharged and it was time for her to go home. Pt refusing to leave at this time. EDP and social work made aware. Social work to bedside to speak with pt. Pt continues to be up for discharge Taken out by security in paper scrubs and personal wheelchair.

## 2023-06-15 ENCOUNTER — Encounter (HOSPITAL_COMMUNITY): Payer: Self-pay | Admitting: Emergency Medicine

## 2023-06-15 ENCOUNTER — Emergency Department (HOSPITAL_COMMUNITY)
Admission: EM | Admit: 2023-06-15 | Discharge: 2023-06-15 | Disposition: A | Discharge status: Home / Self Care | Attending: Emergency Medicine | Admitting: Emergency Medicine

## 2023-06-15 ENCOUNTER — Other Ambulatory Visit: Payer: Self-pay

## 2023-06-15 ENCOUNTER — Emergency Department (HOSPITAL_COMMUNITY)

## 2023-06-15 DIAGNOSIS — E1122 Type 2 diabetes mellitus with diabetic chronic kidney disease: Secondary | ICD-10-CM | POA: Diagnosis not present

## 2023-06-15 DIAGNOSIS — F22 Delusional disorders: Secondary | ICD-10-CM | POA: Diagnosis not present

## 2023-06-15 DIAGNOSIS — I509 Heart failure, unspecified: Secondary | ICD-10-CM | POA: Diagnosis not present

## 2023-06-15 DIAGNOSIS — I132 Hypertensive heart and chronic kidney disease with heart failure and with stage 5 chronic kidney disease, or end stage renal disease: Secondary | ICD-10-CM | POA: Diagnosis not present

## 2023-06-15 DIAGNOSIS — N186 End stage renal disease: Secondary | ICD-10-CM | POA: Insufficient documentation

## 2023-06-15 DIAGNOSIS — R0789 Other chest pain: Secondary | ICD-10-CM | POA: Insufficient documentation

## 2023-06-15 DIAGNOSIS — Z79899 Other long term (current) drug therapy: Secondary | ICD-10-CM | POA: Insufficient documentation

## 2023-06-15 DIAGNOSIS — Z992 Dependence on renal dialysis: Secondary | ICD-10-CM | POA: Insufficient documentation

## 2023-06-15 DIAGNOSIS — Z91010 Allergy to peanuts: Secondary | ICD-10-CM | POA: Diagnosis not present

## 2023-06-15 DIAGNOSIS — Z8673 Personal history of transient ischemic attack (TIA), and cerebral infarction without residual deficits: Secondary | ICD-10-CM | POA: Insufficient documentation

## 2023-06-15 LAB — CBC WITH DIFFERENTIAL/PLATELET
Abs Immature Granulocytes: 0.02 10*3/uL (ref 0.00–0.07)
Basophils Absolute: 0 10*3/uL (ref 0.0–0.1)
Basophils Relative: 0 %
Eosinophils Absolute: 0.1 10*3/uL (ref 0.0–0.5)
Eosinophils Relative: 2 %
HCT: 41.6 % (ref 36.0–46.0)
Hemoglobin: 13 g/dL (ref 12.0–15.0)
Immature Granulocytes: 0 %
Lymphocytes Relative: 18 %
Lymphs Abs: 0.9 10*3/uL (ref 0.7–4.0)
MCH: 29.7 pg (ref 26.0–34.0)
MCHC: 31.3 g/dL (ref 30.0–36.0)
MCV: 95.2 fL (ref 80.0–100.0)
Monocytes Absolute: 0.4 10*3/uL (ref 0.1–1.0)
Monocytes Relative: 8 %
Neutro Abs: 3.8 10*3/uL (ref 1.7–7.7)
Neutrophils Relative %: 72 %
Platelets: 297 10*3/uL (ref 150–400)
RBC: 4.37 MIL/uL (ref 3.87–5.11)
RDW: 14.8 % (ref 11.5–15.5)
WBC: 5.2 10*3/uL (ref 4.0–10.5)
nRBC: 0 % (ref 0.0–0.2)

## 2023-06-15 LAB — COMPREHENSIVE METABOLIC PANEL WITH GFR
ALT: 14 U/L (ref 0–44)
AST: 25 U/L (ref 15–41)
Albumin: 3.4 g/dL — ABNORMAL LOW (ref 3.5–5.0)
Alkaline Phosphatase: 84 U/L (ref 38–126)
Anion gap: 16 — ABNORMAL HIGH (ref 5–15)
BUN: 38 mg/dL — ABNORMAL HIGH (ref 8–23)
CO2: 24 mmol/L (ref 22–32)
Calcium: 9.5 mg/dL (ref 8.9–10.3)
Chloride: 95 mmol/L — ABNORMAL LOW (ref 98–111)
Creatinine, Ser: 4.66 mg/dL — ABNORMAL HIGH (ref 0.44–1.00)
GFR, Estimated: 10 mL/min — ABNORMAL LOW (ref >60)
Glucose, Bld: 108 mg/dL — ABNORMAL HIGH (ref 70–99)
Potassium: 4.1 mmol/L (ref 3.5–5.1)
Sodium: 135 mmol/L (ref 135–145)
Total Bilirubin: 1.3 mg/dL — ABNORMAL HIGH (ref 0.0–1.2)
Total Protein: 7.4 g/dL (ref 6.5–8.1)

## 2023-06-15 LAB — TROPONIN I (HIGH SENSITIVITY)
Troponin I (High Sensitivity): 14 ng/L (ref <18)
Troponin I (High Sensitivity): 12 ng/L (ref <18)

## 2023-06-15 LAB — BRAIN NATRIURETIC PEPTIDE: B Natriuretic Peptide: 43.2 pg/mL (ref 0.0–100.0)

## 2023-06-15 MED ORDER — QUETIAPINE FUMARATE 100 MG PO TABS
100.0000 mg | ORAL_TABLET | Freq: Three times a day (TID) | ORAL | Status: DC
  Administered 2023-06-15 (×2): 100 mg via ORAL
  Filled 2023-06-15 (×2): qty 1

## 2023-06-15 MED ORDER — HYDROXYZINE HCL 10 MG PO TABS: 10.0000 mg | ORAL_TABLET | Freq: Three times a day (TID) | ORAL | Status: DC | PRN

## 2023-06-15 MED ORDER — MELATONIN 5 MG PO TABS: 10.0000 mg | ORAL_TABLET | Freq: Every evening | ORAL | Status: DC | PRN

## 2023-06-15 MED ORDER — FERROUS SULFATE 325 (65 FE) MG PO TABS
325.0000 mg | ORAL_TABLET | Freq: Every day | ORAL | Status: DC
  Administered 2023-06-15: 325 mg via ORAL
  Filled 2023-06-15: qty 1

## 2023-06-15 MED ORDER — BUDESON-GLYCOPYRROL-FORMOTEROL 160-9-4.8 MCG/ACT IN AERO
2.0000 | INHALATION_SPRAY | Freq: Two times a day (BID) | RESPIRATORY_TRACT | Status: DC
  Filled 2023-06-15: qty 5.9

## 2023-06-15 MED ORDER — ESCITALOPRAM OXALATE 10 MG PO TABS: 10.0000 mg | ORAL_TABLET | Freq: Every day | ORAL | Status: DC

## 2023-06-15 MED ORDER — DULOXETINE HCL 30 MG PO CPEP
30.0000 mg | ORAL_CAPSULE | Freq: Every day | ORAL | Status: DC
  Administered 2023-06-15: 30 mg via ORAL
  Filled 2023-06-15: qty 1

## 2023-06-15 MED ORDER — ASENAPINE MALEATE 10 MG SL SUBL: 1.0000 | SUBLINGUAL_TABLET | Freq: Every day | SUBLINGUAL | Status: DC

## 2023-06-15 MED ORDER — TORSEMIDE 20 MG PO TABS
20.0000 mg | ORAL_TABLET | Freq: Two times a day (BID) | ORAL | Status: DC
  Administered 2023-06-15: 20 mg via ORAL
  Filled 2023-06-15 (×2): qty 1

## 2023-06-15 MED ORDER — EZETIMIBE 10 MG PO TABS
10.0000 mg | ORAL_TABLET | Freq: Every day | ORAL | Status: DC
  Administered 2023-06-15: 10 mg via ORAL
  Filled 2023-06-15 (×2): qty 1

## 2023-06-15 MED ORDER — ATORVASTATIN CALCIUM 40 MG PO TABS
20.0000 mg | ORAL_TABLET | Freq: Every day | ORAL | Status: DC
  Administered 2023-06-15: 20 mg via ORAL
  Filled 2023-06-15: qty 1

## 2023-06-15 MED ORDER — PANTOPRAZOLE SODIUM 40 MG PO TBEC
40.0000 mg | DELAYED_RELEASE_TABLET | Freq: Every day | ORAL | Status: DC
  Administered 2023-06-15: 40 mg via ORAL
  Filled 2023-06-15: qty 1

## 2023-06-15 MED ORDER — FOLIC ACID 1 MG PO TABS
1.0000 mg | ORAL_TABLET | Freq: Every day | ORAL | Status: DC
  Administered 2023-06-15: 1 mg via ORAL
  Filled 2023-06-15: qty 1

## 2023-06-15 MED ORDER — CARVEDILOL 12.5 MG PO TABS
12.5000 mg | ORAL_TABLET | Freq: Two times a day (BID) | ORAL | Status: DC
  Administered 2023-06-15: 12.5 mg via ORAL
  Filled 2023-06-15: qty 1

## 2023-06-15 MED ORDER — METOLAZONE 2.5 MG PO TABS
2.5000 mg | ORAL_TABLET | ORAL | Status: DC
  Administered 2023-06-15: 2.5 mg via ORAL
  Filled 2023-06-15: qty 1

## 2023-06-15 MED ORDER — ASENAPINE MALEATE 5 MG SL SUBL: 10.0000 mg | SUBLINGUAL_TABLET | Freq: Every day | SUBLINGUAL | Status: DC

## 2023-06-15 MED ORDER — LAMOTRIGINE 25 MG PO TABS
100.0000 mg | ORAL_TABLET | Freq: Every day | ORAL | Status: DC
  Filled 2023-06-15: qty 4

## 2023-06-15 MED ORDER — AMLODIPINE BESYLATE 5 MG PO TABS
10.0000 mg | ORAL_TABLET | Freq: Every day | ORAL | Status: DC
  Administered 2023-06-15: 10 mg via ORAL
  Filled 2023-06-15: qty 2

## 2023-06-15 MED ORDER — TORSEMIDE 20 MG PO TABS: 20.0000 mg | ORAL_TABLET | Freq: Two times a day (BID) | ORAL | Status: DC

## 2023-06-15 MED ORDER — ALBUTEROL SULFATE HFA 108 (90 BASE) MCG/ACT IN AERS: 2.0000 | INHALATION_SPRAY | Freq: Four times a day (QID) | RESPIRATORY_TRACT | Status: DC | PRN

## 2023-06-15 MED ORDER — MIRTAZAPINE 15 MG PO TABS
7.5000 mg | ORAL_TABLET | Freq: Every day | ORAL | Status: DC
  Administered 2023-06-15: 7.5 mg via ORAL
  Filled 2023-06-15: qty 1

## 2023-06-15 NOTE — ED Provider Notes (Addendum)
 [place] EMERGENCY DEPARTMENT AT [facility] Provider Note   CSN: [id number] Arrival date & time: 06/15/23  1435     History  Chief Complaint  Patient presents with   Chest Pain    Tiffany [name] is a 63 y.o. female.  Pt is a 63 yo female with pmhx significant for ESRD on HD, depression, anxiety, hld, paranoia, sleep apnea, GERD, CVA, CHF, asthma, HTN, hx cardiac arrest.  Pt did not go to dialysis for 2 weeks because she does not like her center.  She came to the ED yesterday with needing dialysis.  She did receive dialysis, but did not go home.  She checked back in today for CP.  She has not taken any of her medication today.  She said she did not go home because she does not feel safe there.  She will not go into detail about why she does not feel safe.       Home Medications Prior to Admission medications   Medication Sig Start Date End Date Taking? Authorizing Provider  albuterol  (VENTOLIN  HFA) 108 (90 Base) MCG/ACT inhaler INHALE 2 PUFFS INTO THE LUNGS EVERY 6 HOURS AS NEEDED FOR WHEEZING OR SHORTNESS OF BREATH 10/24/22   Lavada Porteous, DO  amLODipine  (NORVASC ) 10 MG tablet Take 1 tablet (10 mg total) by mouth daily. 02/26/23     Asenapine  Maleate (SAPHRIS ) 10 MG SUBL Place 1 tablet under the tongue daily.    [provider]  atorvastatin  (LIPITOR ) 20 MG tablet Take 1 tablet (20 mg total) by mouth daily. 10/09/22   McDiarmid, Demetra Filter, MD  calcitRIOL  (ROCALTROL ) 0.5 MCG capsule Take 1 capsule (0.5 mcg total) by mouth daily. 10/09/22   McDiarmid, Demetra Filter, MD  carvedilol  (COREG ) 12.5 MG tablet Take 1 tablet (12.5 mg total) by mouth 2 (two) times daily. 02/26/23     cephALEXin  (KEFLEX ) 500 MG capsule Take 1 capsule (500 mg total) by mouth 2 (two) times daily. 06/04/23   Scarlette Currier, MD  cyclobenzaprine  (FLEXERIL ) 10 MG tablet Take 1 tablet (10 mg total) by mouth 3 (three) times daily. 10/09/22   McDiarmid, Demetra Filter, MD  dicyclomine  (BENTYL ) 10 MG capsule Take 1  capsule (10 mg total) by mouth 3 (three) times daily before meals. 10/09/22   McDiarmid, Demetra Filter, MD  DULoxetine  (CYMBALTA ) 30 MG capsule Take 1 capsule (30 mg total) by mouth daily. 03/15/23   Volanda Gruber, NP  epoetin  alfa (EPOGEN ) 10000 UNIT/ML injection Inject 1 mL (10,000 Units total) into the vein every Monday, Wednesday, and Friday with hemodialysis. 11/22/22   Juliana Ocean, DO  escitalopram  (LEXAPRO ) 10 MG tablet Take 1 tablet (10 mg total) by mouth daily. 03/15/23   Volanda Gruber, NP  ezetimibe  (ZETIA ) 10 MG tablet Take 1 tablet (10 mg total) by mouth daily. Please call office to schedule an appt for further refills. Thank you 04/27/23   Elmyra Haggard, MD  ferrous sulfate  325 (65 FE) MG tablet Take 1 tablet (325 mg total) by mouth every other day. Patient taking differently: Take 325 mg by mouth daily. 10/09/22 10/04/23  McDiarmid, Demetra Filter, MD  Fluticasone -Umeclidin-Vilant (TRELEGY ELLIPTA ) 100-62.5-25 MCG/ACT AEPB INHALE 1 PUFF INTO THE LUNGS DAILY 03/20/22   Lavada Porteous, DO  folic acid  (FOLVITE ) 1 MG tablet Take 1 tablet (1 mg total) by mouth daily. 10/09/22   McDiarmid, Demetra Filter, MD  hydrOXYzine  (ATARAX ) 10 MG tablet Take 1 tablet (10 mg total) by mouth 3 (three) times  daily as needed for anxiety. 03/14/23   Volanda Gruber, NP  ibuprofen  (ADVIL ) 200 MG tablet Take 400 mg by mouth 3 (three) times daily.    [provider]  lamoTRIgine  (LAMICTAL ) 100 MG tablet Take 1 tablet (100 mg total) by mouth daily. 02/26/23     lidocaine  (LIDODERM ) 5 % Place 1 patch onto the skin daily. Remove & Discard patch within 12 hours or as directed by MD Patient taking differently: Place 1 patch onto the skin daily. Remove & Discard patch within 12 hours or as directed by MD - Left side 02/20/23   Cala Castleman, MD  loperamide  (IMODIUM ) 2 MG capsule Take 1 capsule (2 mg total) by mouth 2 (two) times daily. Patient taking differently: Take 2 mg by mouth 3 (three) times daily. 10/09/22    McDiarmid, Demetra Filter, MD  Melatonin 10 MG CAPS Take 10 mg by mouth at bedtime as needed (Sleep).    [provider]  metolazone  (ZAROXOLYN ) 2.5 MG tablet Take 2.5 mg by mouth every Monday, Wednesday, and Friday. 11/16/22   [provider]  mirtazapine  (REMERON ) 7.5 MG tablet Take 1 tablet (7.5 mg total) by mouth at bedtime. 03/14/23   Volanda Gruber, NP  omeprazole  (PRILOSEC) 40 MG capsule Take 1 capsule (40 mg total) by mouth every morning. 10/09/22   McDiarmid, Demetra Filter, MD  potassium chloride  SA (KLOR-CON  M) 20 MEQ tablet Take 20 mEq by mouth See admin instructions. Sometimes the patient needs and sometimes she doesn't. [name] instructed by the dialysis center on when to take it. Patient not taking: Reported on 06/01/2023    [provider]  prazosin  (MINIPRESS ) 2 MG capsule Take 1 capsule (2 mg total) by mouth at bedtime. Patient not taking: Reported on 03/11/2023 02/26/23     pregabalin  (LYRICA ) 100 MG capsule Take 100 mg by mouth 3 (three) times daily. Patient not taking: Reported on 03/11/2023 02/27/23   [provider]  QUEtiapine  (SEROQUEL ) 50 MG tablet Take 1 tablet (50 mg total) by mouth 2 (two) times daily. Patient taking differently: Take 100 mg by mouth 3 (three) times daily. 03/14/23   Volanda Gruber, NP  torsemide  (DEMADEX ) 20 MG tablet Take 1 tablet (20 mg total) by mouth 2 (two) times daily. Patient taking differently: Take 20 mg by mouth in the morning, at noon, and at bedtime. 02/13/23         Allergies    Asa [aspirin ], Hydrocodone , Latuda [lurasidone hcl], Magnesium -containing compounds, Prednisone , Ultram  [tramadol ], Codeine, Desyrel  [trazodone ], Hydralazine , Peanut (diagnostic), Peanut-containing drug products, Topamax  [topiramate ], Sulfa antibiotics, and Tape    Review of Systems   Review of Systems  Cardiovascular:  Positive for chest pain.  All other systems reviewed and are negative.   Physical Exam Updated Vital Signs BP (!) 143/93  (BP Location: Right Arm)   Pulse 87   Temp (!) 97.4 F (36.3 C) (Oral)   Resp 17   Wt 83 kg   SpO2 100%   BMI 32.41 kg/m  Physical Exam Vitals and nursing note reviewed.  Constitutional:      Appearance: [name] well-developed.  HENT:     Head: Normocephalic and atraumatic.  Eyes:     Extraocular Movements: Extraocular movements intact.     Pupils: Pupils are equal, round, and reactive to light.  Cardiovascular:     Rate and Rhythm: Normal rate and regular rhythm.     Heart sounds: Normal heart sounds.  Pulmonary:  Effort: Pulmonary effort is normal.     Breath sounds: Normal breath sounds.  Abdominal:     General: Bowel sounds are normal.     Palpations: Abdomen is soft.  Musculoskeletal:        General: Normal range of motion.     Cervical back: Normal range of motion and neck supple.  Skin:    General: Skin is warm.     Capillary Refill: Capillary refill takes less than 2 seconds.  Neurological:     General: No focal deficit present.     Mental Status: [name] alert and oriented to person, place, and time.  Psychiatric:        Mood and Affect: Mood normal.        Behavior: Behavior normal.     ED Results / Procedures / Treatments   Labs (all labs ordered are listed, but only abnormal results are displayed) Labs Reviewed  COMPREHENSIVE METABOLIC PANEL WITH GFR - Abnormal; Notable for the following components:      Result Value   Chloride 95 (*)    Glucose, Bld 108 (*)    BUN 38 (*)    Creatinine, Ser 4.66 (*)    Albumin  3.4 (*)    Total Bilirubin 1.3 (*)    GFR, Estimated 10 (*)    Anion gap 16 (*)    All other components within normal limits  CBC WITH DIFFERENTIAL/PLATELET  BRAIN NATRIURETIC PEPTIDE  HEPATITIS B CORE ANTIBODY, TOTAL  TROPONIN I (HIGH SENSITIVITY)  TROPONIN I (HIGH SENSITIVITY)    EKG EKG Interpretation Date/Time:  Friday Jun 15 2023 14:51:49 EDT Ventricular Rate:  81 PR Interval:  148 QRS Duration:  76 QT Interval:  410 QTC  Calculation: 476 R Axis:   -41  Text Interpretation: Normal sinus rhythm Left axis deviation Anterior infarct , age undetermined Abnormal ECG When compared with ECG of 13-Jun-2023 22:55, PREVIOUS ECG IS PRESENT No significant change since last tracing Confirmed by Sueellen Emery [id number]) on 06/15/2023 3:32:50 PM  Radiology DG Chest 2 View Result Date: 06/15/2023 CLINICAL DATA:  Chest pain EXAM: CHEST - 2 VIEW COMPARISON:  Chest x-ray 06/05/2023 FINDINGS: The heart is enlarged. Right chest port catheter tip ends in the distal SVC. There is no focal lung infiltrate, pleural effusion or pneumothorax. There are healed left-sided rib fractures. IMPRESSION: Cardiomegaly. No acute cardiopulmonary process. Electronically Signed   By: Tyron Gallon M.D.   On: 06/15/2023 17:50   DG Chest 2 View Result Date: 06/13/2023 CLINICAL DATA:  Missed dialysis EXAM: CHEST - 2 VIEW COMPARISON:  03/10/2023 FINDINGS: Right-sided central venous catheter tip at the right atrium. Stable cardiomediastinal silhouette. Mild central vascular congestion. No overt edema or sizable pleural effusion. No pneumothorax IMPRESSION: Mild central vascular congestion. Electronically Signed   By: Esmeralda Hedge M.D.   On: 06/13/2023 23:17    Procedures Procedures    Medications Ordered in ED Medications  albuterol  (VENTOLIN  HFA) 108 (90 Base) MCG/ACT inhaler 2 puff (has no administration in time range)  amLODipine  (NORVASC ) tablet 10 mg (10 mg Oral Given 06/15/23 1653)  Asenapine  Maleate SUBL 10 mg (has no administration in time range)  atorvastatin  (LIPITOR ) tablet 20 mg (20 mg Oral Given 06/15/23 1653)  carvedilol  (COREG ) tablet 12.5 mg (has no administration in time range)  DULoxetine  (CYMBALTA ) DR capsule 30 mg (30 mg Oral Given 06/15/23 1653)  ezetimibe  (ZETIA ) tablet 10 mg (has no administration in time range)  ferrous sulfate  tablet 325 mg (325 mg Oral Given  06/15/23 1652)  budesonide -glycopyrrolate-formoterol  (BREZTRI) 160-9-4.8  MCG/ACT inhaler 2 puff (has no administration in time range)  folic acid  (FOLVITE ) tablet 1 mg (1 mg Oral Given 06/15/23 1653)  hydrOXYzine  (ATARAX ) tablet 10 mg (has no administration in time range)  lamoTRIgine  (LAMICTAL ) tablet 100 mg (has no administration in time range)  melatonin tablet 10 mg (has no administration in time range)  metolazone  (ZAROXOLYN ) tablet 2.5 mg (has no administration in time range)  mirtazapine  (REMERON ) tablet 7.5 mg (has no administration in time range)  pantoprazole  (PROTONIX ) EC tablet 40 mg (40 mg Oral Given 06/15/23 1652)  QUEtiapine  (SEROQUEL ) tablet 100 mg (100 mg Oral Given 06/15/23 1652)  torsemide  (DEMADEX ) tablet 20 mg (has no administration in time range)  escitalopram  (LEXAPRO ) tablet 10 mg (has no administration in time range)    ED Course/ Medical Decision Making/ A&P                                 Medical Decision Making Amount and/or Complexity of Data Reviewed Labs: ordered.  Risk OTC drugs. Prescription drug management.   This patient presents to the ED for concern of cp, this involves an extensive number of treatment options, and is a complaint that carries with it a high risk of complications and morbidity.  The differential diagnosis includes cardiac, pulm, gi, msk, malingering   Co morbidities that complicate the patient evaluation   ESRD on HD, depression, anxiety, hld, paranoia, sleep apnea, GERD, CVA, CHF, asthma, HTN, hx cardiac arrest   Additional history obtained:  Additional history obtained from epic chart review  Lab Tests:  I Ordered, and personally interpreted labs.  The pertinent results include:  cbc nl, cmp nl other than bun 38 and cr 4.66 (chronic), trop nl times 2, bnp nl   Imaging Studies ordered:  I ordered imaging studies including cxr  I independently visualized and interpreted imaging which showed Cardiomegaly. No acute cardiopulmonary process.  I agree with the radiologist  interpretation   Cardiac Monitoring:  The patient was maintained on a cardiac monitor.  I personally viewed and interpreted the cardiac monitored which showed an underlying rhythm of: nsr   Medicines ordered and prescription drug management:   I have reviewed the patients home medicines and have made adjustments as needed   Consultations Obtained:  I requested consultation with TOC/SW/psych,  and discussed lab and imaging findings as well as pertinent plan - consult pending   Problem List / ED Course:  ESRD on HD:  pt had dialysis yesterday.  She will need it again tomorrow if she is still here.  Renal SW working on another outpatient facility Paranoia:  pt is requesting to speak to psych.  She thinks that would be helpful.  TTS consult placed. Consult done and she is cleared for d/c.  She said she is willing to go home. CP:  cardiac work up neg.  [name] medically clear.   Reevaluation:  After the interventions noted above, I reevaluated the patient and found that they have :improved   Social Determinants of Health:  Lives at home   Dispostion:  Discharge with outpatient f/u        Final Clinical Impression(s) / ED Diagnoses Final diagnoses:  Atypical chest pain  ESRD on hemodialysis (HCC)  Paranoia (HCC)    Rx / DC Orders ED Discharge Orders     None         Kevante Lunt,  Concha Deed, MD 06/15/23 Wilmer Hash    Sueellen Emery, MD 06/15/23 2224

## 2023-06-15 NOTE — ED Notes (Signed)
 TTS at bedside.

## 2023-06-15 NOTE — ED Notes (Signed)
 Pt refused blood draw

## 2023-06-15 NOTE — Progress Notes (Addendum)
 Requested to see pt by ED staff. Met with pt outside of ED exit with renal RN coordinator. Pt was not willing to really discuss her situation with us . Navigator offered to help find pt a new clinic in [place] area. Pt says she does not want to go to another [facility] clinic. Navigator offered to assist with clinic placement in [place] and/or [place] non-[facility] clinics and pt would not agree/commit to that either. When talking with pt, it was really difficult to have a logical conversation with pt. Pt provided the name and number for renal navigator to call navigator if she wants to discuss her situation when the time is best for her or if pt wants to call navigator for assistance with new clinic placement. Update provided to ED staff regarding the above.   Lauraine Polite Renal Navigator [phone]  Addendum at 3:13 pm: Received call from pt. After some further discussions, pt agreeable to referral being made to [facility] for review. Contacted [facility] [place] to provide update. Clinic agreeable to make referral to [facility] due to clinic having HD clinicals needed to request a transfer request to a different clinic. Update provided to ED staff.

## 2023-06-15 NOTE — ED Notes (Signed)
 Pt to xray at this time, will go to room 24 after scans.

## 2023-06-15 NOTE — ED Provider Triage Note (Signed)
 Emergency Medicine Provider Triage Evaluation Note  Tiffany [name] , a 63 y.o. female  was evaluated in triage.  Pt complains of chest pain and tightness started earlier today after not taking her medication.  Evaluated here last night.  Review of Systems  Positive:  Negative:   Physical Exam  BP (!) 147/104 (BP Location: Right Arm)   Pulse 84   Temp 97.8 F (36.6 C) (Oral)   Resp 20   Wt 83 kg   SpO2 100%   BMI 32.41 kg/m  Gen:   Awake, no distress   Resp:  Normal effort  MSK:   Moves extremities without difficulty, bilateral lower extremity 1+ pitting edema Other:    Medical Decision Making  Medically screening exam initiated at 3:12 PM.  Appropriate orders placed.  Tiffany [name] was informed that the remainder of the evaluation will be completed by another provider, this initial triage assessment does not replace that evaluation, and the importance of remaining in the ED until their evaluation is complete.     Felicie Horning, PA-C 06/15/23 1514

## 2023-06-15 NOTE — ED Triage Notes (Signed)
 Patient was discharged yesterday and has never left the lobby, and has checked back in.  Patient c/o heart "fluttering" due to not taking her coreg  while she has been here.  Patient gives verbal consent for MSE.
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
## 2023-06-19 ENCOUNTER — Other Ambulatory Visit: Payer: Self-pay | Admitting: Student

## 2023-06-21 ENCOUNTER — Emergency Department (HOSPITAL_COMMUNITY)

## 2023-06-21 ENCOUNTER — Emergency Department (HOSPITAL_COMMUNITY)
Admission: EM | Admit: 2023-06-21 | Discharge: 2023-06-22 | Disposition: A | Discharge status: Home / Self Care | Attending: Emergency Medicine | Admitting: Emergency Medicine

## 2023-06-21 ENCOUNTER — Other Ambulatory Visit: Payer: Self-pay

## 2023-06-21 ENCOUNTER — Encounter (HOSPITAL_COMMUNITY): Payer: Self-pay | Admitting: *Deleted

## 2023-06-21 DIAGNOSIS — J449 Chronic obstructive pulmonary disease, unspecified: Secondary | ICD-10-CM | POA: Diagnosis not present

## 2023-06-21 DIAGNOSIS — Z940 Kidney transplant status: Secondary | ICD-10-CM | POA: Diagnosis not present

## 2023-06-21 DIAGNOSIS — R0602 Shortness of breath: Secondary | ICD-10-CM | POA: Diagnosis present

## 2023-06-21 DIAGNOSIS — I2510 Atherosclerotic heart disease of native coronary artery without angina pectoris: Secondary | ICD-10-CM | POA: Insufficient documentation

## 2023-06-21 DIAGNOSIS — Z91010 Allergy to peanuts: Secondary | ICD-10-CM | POA: Insufficient documentation

## 2023-06-21 DIAGNOSIS — Z7951 Long term (current) use of inhaled steroids: Secondary | ICD-10-CM | POA: Diagnosis not present

## 2023-06-21 DIAGNOSIS — Z79899 Other long term (current) drug therapy: Secondary | ICD-10-CM | POA: Insufficient documentation

## 2023-06-21 DIAGNOSIS — I120 Hypertensive chronic kidney disease with stage 5 chronic kidney disease or end stage renal disease: Secondary | ICD-10-CM | POA: Diagnosis not present

## 2023-06-21 DIAGNOSIS — Z992 Dependence on renal dialysis: Secondary | ICD-10-CM | POA: Diagnosis not present

## 2023-06-21 DIAGNOSIS — N186 End stage renal disease: Secondary | ICD-10-CM | POA: Diagnosis not present

## 2023-06-21 LAB — CBC
HCT: 41.2 % (ref 36.0–46.0)
Hemoglobin: 12.4 g/dL (ref 12.0–15.0)
MCH: 29.2 pg (ref 26.0–34.0)
MCHC: 30.1 g/dL (ref 30.0–36.0)
MCV: 96.9 fL (ref 80.0–100.0)
Platelets: 334 10*3/uL (ref 150–400)
RBC: 4.25 MIL/uL (ref 3.87–5.11)
RDW: 14.7 % (ref 11.5–15.5)
WBC: 4.8 10*3/uL (ref 4.0–10.5)
nRBC: 0 % (ref 0.0–0.2)

## 2023-06-21 LAB — BASIC METABOLIC PANEL WITH GFR
Anion gap: 16 — ABNORMAL HIGH (ref 5–15)
BUN: 63 mg/dL — ABNORMAL HIGH (ref 8–23)
CO2: 22 mmol/L (ref 22–32)
Calcium: 9.8 mg/dL (ref 8.9–10.3)
Chloride: 100 mmol/L (ref 98–111)
Creatinine, Ser: 6.51 mg/dL — ABNORMAL HIGH (ref 0.44–1.00)
GFR, Estimated: 7 mL/min — ABNORMAL LOW (ref >60)
Glucose, Bld: 82 mg/dL (ref 70–99)
Potassium: 3.4 mmol/L — ABNORMAL LOW (ref 3.5–5.1)
Sodium: 138 mmol/L (ref 135–145)

## 2023-06-21 LAB — TROPONIN I (HIGH SENSITIVITY): Troponin I (High Sensitivity): 12 ng/L (ref <18)

## 2023-06-21 LAB — BRAIN NATRIURETIC PEPTIDE: B Natriuretic Peptide: 29.1 pg/mL (ref 0.0–100.0)

## 2023-06-21 MED ORDER — HEPARIN SODIUM (PORCINE) 1000 UNIT/ML IJ SOLN
1000.0000 [IU] | Freq: Once | INTRAMUSCULAR | Status: AC
  Administered 2023-06-21: 1000 [IU] via INTRA_ARTERIAL

## 2023-06-21 MED ORDER — HEPARIN SODIUM (PORCINE) 1000 UNIT/ML IJ SOLN
INTRAMUSCULAR | Status: AC
  Filled 2023-06-21: qty 4

## 2023-06-21 MED ORDER — CHLORHEXIDINE GLUCONATE CLOTH 2 % EX PADS: 6.0000 | MEDICATED_PAD | Freq: Every day | CUTANEOUS | Status: DC

## 2023-06-21 NOTE — Progress Notes (Signed)
 Nephrology brief note: 63 y/o F ESRD on HD p/w in need of HD. Labs, imaging studies reviewed and discussed with ER provider.  Plan for HD tonight and then reassessment in ER post HD. D/w HD nurse.  Dr. Edson Graces.

## 2023-06-21 NOTE — ED Provider Notes (Signed)
 [facility] Provider Note   CSN: [id number] Arrival date & time: 06/21/23  1416     History  Chief Complaint  Patient presents with   Leg Swelling    Missed HDx2    Tiffany [name] is a 63 y.o. female with PMHx depression, seizures, ESRD on MWF dialysis, COPD, OSA, schizoaffective disorder, PTSD, paranoid schizophrenia, GERD, HLD, CAD, who presents to ED for missed dialysis. Patient endorses increasing leg swelling and SOB since missing multiple dialysis sessions. Patient also endorsing slight confusion as well which she also states is d/t missing dialysis.  Patient does make urine.  Denies fever, chest pain.    HPI     Home Medications Prior to Admission medications   Medication Sig Start Date End Date Taking? Authorizing Provider  albuterol  (VENTOLIN  HFA) 108 (90 Base) MCG/ACT inhaler INHALE 2 PUFFS INTO THE LUNGS EVERY 6 HOURS AS NEEDED FOR WHEEZING OR SHORTNESS OF BREATH 10/24/22   Lavada Porteous, DO  amLODipine  (NORVASC ) 10 MG tablet Take 1 tablet (10 mg total) by mouth daily. 02/26/23     Asenapine  Maleate (SAPHRIS ) 10 MG SUBL Place 1 tablet under the tongue daily.    [provider]  atorvastatin  (LIPITOR ) 20 MG tablet Take 1 tablet (20 mg total) by mouth daily. 10/09/22   McDiarmid, Demetra Filter, MD  calcitRIOL  (ROCALTROL ) 0.5 MCG capsule Take 1 capsule (0.5 mcg total) by mouth daily. 10/09/22   McDiarmid, Demetra Filter, MD  carvedilol  (COREG ) 12.5 MG tablet Take 1 tablet (12.5 mg total) by mouth 2 (two) times daily. 02/26/23     cephALEXin  (KEFLEX ) 500 MG capsule Take 1 capsule (500 mg total) by mouth 2 (two) times daily. Patient not taking: Reported on 06/15/2023 06/04/23   Scarlette Currier, MD  cyclobenzaprine  (FLEXERIL ) 10 MG tablet Take 1 tablet (10 mg total) by mouth 3 (three) times daily. 10/09/22   McDiarmid, Demetra Filter, MD  dicyclomine  (BENTYL ) 10 MG capsule Take 1 capsule (10 mg total) by mouth 3 (three) times daily before meals.  10/09/22   McDiarmid, Demetra Filter, MD  DULoxetine  (CYMBALTA ) 30 MG capsule Take 1 capsule (30 mg total) by mouth daily. 03/15/23   Volanda Gruber, NP  epoetin  alfa (EPOGEN ) 10000 UNIT/ML injection Inject 1 mL (10,000 Units total) into the vein every Monday, Wednesday, and Friday with hemodialysis. 11/22/22   Juliana Ocean, DO  escitalopram  (LEXAPRO ) 10 MG tablet Take 1 tablet (10 mg total) by mouth daily. 03/15/23   Volanda Gruber, NP  ezetimibe  (ZETIA ) 10 MG tablet Take 1 tablet (10 mg total) by mouth daily. Please call office to schedule an appt for further refills. Thank you 04/27/23   Elmyra Haggard, MD  ferrous sulfate  325 (65 FE) MG tablet Take 1 tablet (325 mg total) by mouth every other day. Patient taking differently: Take 325 mg by mouth daily. 10/09/22 10/04/23  McDiarmid, Demetra Filter, MD  Fluticasone -Umeclidin-Vilant (TRELEGY ELLIPTA ) 100-62.5-25 MCG/ACT AEPB INHALE 1 PUFF INTO THE LUNGS DAILY 03/20/22   Lavada Porteous, DO  folic acid  (FOLVITE ) 1 MG tablet Take 1 tablet (1 mg total) by mouth daily. 10/09/22   McDiarmid, Demetra Filter, MD  hydrOXYzine  (ATARAX ) 10 MG tablet Take 1 tablet (10 mg total) by mouth 3 (three) times daily as needed for anxiety. 03/14/23   Volanda Gruber, NP  ibuprofen  (ADVIL ) 200 MG tablet Take 400 mg by mouth 3 (three) times daily.    [provider]  lamoTRIgine  (LAMICTAL ) 100  MG tablet Take 1 tablet (100 mg total) by mouth daily. 02/26/23     lidocaine  (LIDODERM ) 5 % Place 1 patch onto the skin daily. Remove & Discard patch within 12 hours or as directed by MD Patient taking differently: Place 1 patch onto the skin daily. Remove & Discard patch within 12 hours or as directed by MD - Left side 02/20/23   Cala Castleman, MD  loperamide  (IMODIUM ) 2 MG capsule Take 1 capsule (2 mg total) by mouth 2 (two) times daily. Patient taking differently: Take 2 mg by mouth 3 (three) times daily. 10/09/22   McDiarmid, Demetra Filter, MD  Melatonin 10 MG CAPS Take 10 mg by mouth at  bedtime as needed (Sleep).    [provider]  metolazone  (ZAROXOLYN ) 2.5 MG tablet Take 2.5 mg by mouth every Monday, Wednesday, and Friday. 11/16/22   [provider]  mirtazapine  (REMERON ) 7.5 MG tablet Take 1 tablet (7.5 mg total) by mouth at bedtime. Patient taking differently: Take 15 mg by mouth at bedtime. 03/14/23   Volanda Gruber, NP  omeprazole  (PRILOSEC) 40 MG capsule Take 1 capsule (40 mg total) by mouth every morning. 10/09/22   McDiarmid, Demetra Filter, MD  prazosin  (MINIPRESS ) 2 MG capsule Take 1 capsule (2 mg total) by mouth at bedtime. Patient not taking: Reported on 03/11/2023 02/26/23     QUEtiapine  (SEROQUEL ) 50 MG tablet Take 1 tablet (50 mg total) by mouth 2 (two) times daily. Patient taking differently: Take 100 mg by mouth 3 (three) times daily. 03/14/23   Volanda Gruber, NP  torsemide  (DEMADEX ) 20 MG tablet Take 1 tablet (20 mg total) by mouth 2 (two) times daily. Patient taking differently: Take 20 mg by mouth in the morning, at noon, and at bedtime. 02/13/23         Allergies    Asa [aspirin ], Hydrocodone , Latuda [lurasidone hcl], Magnesium -containing compounds, Prednisone , Ultram  [tramadol ], Codeine, Desyrel  [trazodone ], Hydralazine , Peanut (diagnostic), Peanut-containing drug products, Topamax  [topiramate ], Sulfa antibiotics, and Tape    Review of Systems   Review of Systems  Respiratory:  Positive for shortness of breath.     Physical Exam Updated Vital Signs BP (!) 184/106   Pulse 83   Temp 99 F (37.2 C) (Oral)   Resp 16   SpO2 98%  Physical Exam Vitals and nursing note reviewed.  Constitutional:      General: [name] not in acute distress.    Appearance: [name] not ill-appearing or toxic-appearing.  HENT:     Head: Normocephalic and atraumatic.     Mouth/Throat:     Mouth: Mucous membranes are moist.  Eyes:     General: No scleral icterus.       Right eye: No discharge.        Left eye: No discharge.     Conjunctiva/sclera:  Conjunctivae normal.  Cardiovascular:     Rate and Rhythm: Normal rate and regular rhythm.     Pulses: Normal pulses.     Heart sounds: Normal heart sounds. No murmur heard. Pulmonary:     Effort: Pulmonary effort is normal. No respiratory distress.     Breath sounds: Normal breath sounds. No wheezing, rhonchi or rales.  Abdominal:     General: Abdomen is flat.  Musculoskeletal:     Comments: +2 pitting edema BL  Skin:    General: Skin is warm and dry.     Findings: No rash.  Neurological:     General: No focal deficit present.  Mental Status: [name] alert and oriented to person, place, and time. Mental status is at baseline.     Comments: GCS 15. Speech is goal oriented. No deficits appreciated to CN III-XII. Patient moves extremities without ataxia. Able to eat [place] sandwich in ED.   Psychiatric:        Mood and Affect: Mood normal.        Behavior: Behavior normal.     ED Results / Procedures / Treatments   Labs (all labs ordered are listed, but only abnormal results are displayed) Labs Reviewed  BASIC METABOLIC PANEL WITH GFR - Abnormal; Notable for the following components:      Result Value   Potassium 3.4 (*)    BUN 63 (*)    Creatinine, Ser 6.51 (*)    GFR, Estimated 7 (*)    Anion gap 16 (*)    All other components within normal limits  CBC  BRAIN NATRIURETIC PEPTIDE  HEPATITIS B SURFACE ANTIGEN  HEPATITIS B SURFACE ANTIBODY, QUANTITATIVE  TROPONIN I (HIGH SENSITIVITY)    EKG None  Radiology DG Chest 2 View Result Date: 06/21/2023 CLINICAL DATA:  Lower extremity edema. EXAM: CHEST - 2 VIEW COMPARISON:  Jun 15, 2023. FINDINGS: Stable cardiomediastinal silhouette. Right internal jugular dialysis catheter is unchanged. Old left rib fractures. No acute pulmonary disease. IMPRESSION: No active cardiopulmonary disease. Electronically Signed   By: Rosalene Colon M.D.   On: 06/21/2023 15:08    Procedures Procedures    Medications Ordered in  ED Medications  Chlorhexidine  Gluconate Cloth 2 % PADS 6 each (has no administration in time range)    ED Course/ Medical Decision Making/ A&P                                 Medical Decision Making Amount and/or Complexity of Data Reviewed Labs: ordered. Radiology: ordered.   This patient presents to the ED for concern of shortness of breath, this involves an extensive number of treatment options, and is a complaint that carries with it a high risk of complications and morbidity.  The differential diagnosis includes Anxiety, Anaphylaxis/Angioedema, Aspirated FB, Arrhythmia, CHF, Asthma, COPD, PNA, COVID/Flu/RSV, STEMI, Tamponade, TPNX, Sepsis   Co morbidities that complicate the patient evaluation  depression, seizures, ESRD on MWF dialysis, COPD, OSA, schizoaffective disorder, PTSD, paranoid schizophrenia, GERD, HLD, CAD   Additional history obtained:  Past ED notes: patient coming to ED for dialysis because she does not like her outpatient dialysis centers. Patient here 1 week ago on 5/29 and received dialysis.   Problem List / ED Course / Critical interventions / Medication management  Patient presents to ED concern for worsening SOB and BL LE edema since missing multiple dialysis sessions.  Physical exam with +2 pitting edema to BL LE.  Rest of physical exam reassuring.  Patient afebrile with stable vitals. I Ordered, and personally interpreted labs.  CBC without leukocytosis or anemia.  BMP with mild hypokalemia at 3.4.  BUN/creatinine slightly elevated past baseline at 63/6.51 today.  Mild anion gap at 16.  BNP within normal limits.  Troponin within normal limits-patient denying chest pain today. The patient was maintained on a cardiac monitor.  I personally viewed and interpreted the cardiac monitored which showed an underlying rhythm of: Sinus rhythm without acute changes. I ordered imaging studies including chest xray to assess for process contributing to patient's  symptoms. I independently visualized and interpreted imaging which  showed no acute cardiopulmonary disease. I agree with the radiologist interpretation It appears that patient's symptoms are due to missing multiple dialysis sessions. I requested consultation with the nephrologist on-call Dr. Edson Graces,  and discussed lab and imaging findings as well as pertinent plan - they agree to see patient in ED for dialysis. Patient otherwise appears stable and should be appropriate for discharge after dialysis session.   Social Determinants of Health:  none          Final Clinical Impression(s) / ED Diagnoses Final diagnoses:  ESRD on dialysis [facility])    Rx / DC Orders ED Discharge Orders     None         [place] Bureau, PA-C 06/21/23 1740    Nolberto Batty, DO 06/21/23 2004

## 2023-06-21 NOTE — ED Notes (Addendum)
 EDP Tiffany [name] informed of pts arrival from dialysis. Pt had missed 2 days of dialysis. Was scheduled for a 3 hr dialysis treatment today - tx stopped after 1.5 hours with 1.5 L pulled. Returned to ED and treatment stopped d/t concern for chest pain. pt sts she had intermittent cp prior to treatment. during treatment had centralized chest tightness that radiated into her back. No other s/s. No longer having chest pain. reports concern for missing her scheduled meds.

## 2023-06-21 NOTE — ED Triage Notes (Signed)
 Pt is here by ems from home due to having missed the last two HD treatments.  Pt has HD scheduled MWF and had her last treatment Friday 5/30.  Pt has increasing bilateral lower extremity edema.  NO CP or sob.  Pt lives at home independently.  Pt does not want to tell me why she missed HD

## 2023-06-21 NOTE — ED Provider Triage Note (Signed)
 Emergency Medicine Provider Triage Evaluation Note  Tiffany [name] , a 63 y.o. female  was evaluated in triage.  Pt complains of SOB and leg swelling progressing since missing 2 dialysis appointments this week. Also states that she feels a little confused but is able to talk with me well. Last session was Friday. Patient stating that she is able to make urine. No other complaints today.  Review of Systems  Positive:  Negative:   Physical Exam  BP (!) 184/106   Pulse 83   Temp 99 F (37.2 C) (Oral)   Resp 16   SpO2 98%  Gen:   Awake, no distress   Resp:  Normal effort  MSK:   Moves extremities without difficulty  Other:    Medical Decision Making  Medically screening exam initiated at 2:38 PM.  Appropriate orders placed.  CHARLE MCLAURIN was informed that the remainder of the evaluation will be completed by another provider, this initial triage assessment does not replace that evaluation, and the importance of remaining in the ED until their evaluation is complete.    [place] Bureau, [place] 06/21/23 1439
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
## 2023-06-22 ENCOUNTER — Telehealth: Payer: Self-pay

## 2023-06-22 LAB — TROPONIN I (HIGH SENSITIVITY): Troponin I (High Sensitivity): 17 ng/L (ref <18)

## 2023-06-22 MED ORDER — MIRTAZAPINE 15 MG PO TABS
15.0000 mg | ORAL_TABLET | Freq: Once | ORAL | Status: AC
  Administered 2023-06-22: 15 mg via ORAL
  Filled 2023-06-22: qty 1
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
## 2023-06-22 NOTE — Discharge Instructions (Addendum)
 Go directly to [facility] for further evaluation.    Follow up with HD as scheduled. Follow-up with your PCP. Return here for new concerns.
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
## 2023-06-22 NOTE — Telephone Encounter (Signed)
 Patient above mother stopped by here and states her daughter is asking for a call because she is having issues. [name] a [employer] patient.  Tried calling pt at number listed in her medical record.  It appears that number is disconnected.  See ED notes from 06/21/2023 regarding social issues and case management involvement.

## 2023-06-27 ENCOUNTER — Emergency Department (HOSPITAL_COMMUNITY)

## 2023-06-27 ENCOUNTER — Encounter (HOSPITAL_COMMUNITY): Payer: Self-pay

## 2023-06-27 ENCOUNTER — Other Ambulatory Visit: Payer: Self-pay

## 2023-06-27 ENCOUNTER — Inpatient Hospital Stay (HOSPITAL_COMMUNITY)
Admission: EM | Admit: 2023-06-27 | Discharge: 2023-07-07 | DRG: 871 | Disposition: A | Discharge status: Home / Self Care | Attending: Internal Medicine | Admitting: Internal Medicine

## 2023-06-27 DIAGNOSIS — I132 Hypertensive heart and chronic kidney disease with heart failure and with stage 5 chronic kidney disease, or end stage renal disease: Secondary | ICD-10-CM | POA: Diagnosis present

## 2023-06-27 DIAGNOSIS — G40909 Epilepsy, unspecified, not intractable, without status epilepticus: Secondary | ICD-10-CM | POA: Diagnosis present

## 2023-06-27 DIAGNOSIS — Z992 Dependence on renal dialysis: Secondary | ICD-10-CM | POA: Diagnosis not present

## 2023-06-27 DIAGNOSIS — L03116 Cellulitis of left lower limb: Secondary | ICD-10-CM | POA: Diagnosis present

## 2023-06-27 DIAGNOSIS — A419 Sepsis, unspecified organism: Principal | ICD-10-CM | POA: Diagnosis present

## 2023-06-27 DIAGNOSIS — N186 End stage renal disease: Secondary | ICD-10-CM | POA: Diagnosis present

## 2023-06-27 DIAGNOSIS — I5033 Acute on chronic diastolic (congestive) heart failure: Secondary | ICD-10-CM | POA: Diagnosis present

## 2023-06-27 DIAGNOSIS — E876 Hypokalemia: Secondary | ICD-10-CM | POA: Diagnosis present

## 2023-06-27 DIAGNOSIS — L03119 Cellulitis of unspecified part of limb: Secondary | ICD-10-CM | POA: Diagnosis not present

## 2023-06-27 DIAGNOSIS — I252 Old myocardial infarction: Secondary | ICD-10-CM

## 2023-06-27 DIAGNOSIS — Z8674 Personal history of sudden cardiac arrest: Secondary | ICD-10-CM | POA: Diagnosis not present

## 2023-06-27 DIAGNOSIS — D631 Anemia in chronic kidney disease: Secondary | ICD-10-CM | POA: Diagnosis present

## 2023-06-27 DIAGNOSIS — E785 Hyperlipidemia, unspecified: Secondary | ICD-10-CM | POA: Diagnosis present

## 2023-06-27 DIAGNOSIS — R296 Repeated falls: Secondary | ICD-10-CM | POA: Diagnosis present

## 2023-06-27 DIAGNOSIS — E1141 Type 2 diabetes mellitus with diabetic mononeuropathy: Secondary | ICD-10-CM | POA: Diagnosis present

## 2023-06-27 DIAGNOSIS — J42 Unspecified chronic bronchitis: Secondary | ICD-10-CM

## 2023-06-27 DIAGNOSIS — G4733 Obstructive sleep apnea (adult) (pediatric): Secondary | ICD-10-CM | POA: Diagnosis present

## 2023-06-27 DIAGNOSIS — J4489 Other specified chronic obstructive pulmonary disease: Secondary | ICD-10-CM | POA: Diagnosis present

## 2023-06-27 DIAGNOSIS — G894 Chronic pain syndrome: Secondary | ICD-10-CM | POA: Diagnosis present

## 2023-06-27 DIAGNOSIS — Z6835 Body mass index (BMI) 35.0-35.9, adult: Secondary | ICD-10-CM

## 2023-06-27 DIAGNOSIS — B9620 Unspecified Escherichia coli [E. coli] as the cause of diseases classified elsewhere: Secondary | ICD-10-CM | POA: Diagnosis not present

## 2023-06-27 DIAGNOSIS — E1122 Type 2 diabetes mellitus with diabetic chronic kidney disease: Secondary | ICD-10-CM | POA: Diagnosis present

## 2023-06-27 DIAGNOSIS — M6282 Rhabdomyolysis: Secondary | ICD-10-CM | POA: Diagnosis present

## 2023-06-27 DIAGNOSIS — G931 Anoxic brain damage, not elsewhere classified: Secondary | ICD-10-CM | POA: Diagnosis present

## 2023-06-27 DIAGNOSIS — N2581 Secondary hyperparathyroidism of renal origin: Secondary | ICD-10-CM | POA: Diagnosis present

## 2023-06-27 DIAGNOSIS — Z7952 Long term (current) use of systemic steroids: Secondary | ICD-10-CM

## 2023-06-27 DIAGNOSIS — K219 Gastro-esophageal reflux disease without esophagitis: Secondary | ICD-10-CM | POA: Diagnosis present

## 2023-06-27 DIAGNOSIS — E66811 Obesity, class 1: Secondary | ICD-10-CM | POA: Diagnosis present

## 2023-06-27 DIAGNOSIS — N3000 Acute cystitis without hematuria: Secondary | ICD-10-CM | POA: Diagnosis present

## 2023-06-27 DIAGNOSIS — I5032 Chronic diastolic (congestive) heart failure: Secondary | ICD-10-CM | POA: Diagnosis present

## 2023-06-27 DIAGNOSIS — Z79899 Other long term (current) drug therapy: Secondary | ICD-10-CM

## 2023-06-27 DIAGNOSIS — M109 Gout, unspecified: Secondary | ICD-10-CM | POA: Diagnosis present

## 2023-06-27 DIAGNOSIS — Z91010 Allergy to peanuts: Secondary | ICD-10-CM

## 2023-06-27 DIAGNOSIS — Z8249 Family history of ischemic heart disease and other diseases of the circulatory system: Secondary | ICD-10-CM

## 2023-06-27 DIAGNOSIS — A4151 Sepsis due to Escherichia coli [E. coli]: Secondary | ICD-10-CM | POA: Diagnosis present

## 2023-06-27 DIAGNOSIS — Z8673 Personal history of transient ischemic attack (TIA), and cerebral infarction without residual deficits: Secondary | ICD-10-CM

## 2023-06-27 DIAGNOSIS — F250 Schizoaffective disorder, bipolar type: Secondary | ICD-10-CM | POA: Diagnosis present

## 2023-06-27 DIAGNOSIS — Z1152 Encounter for screening for COVID-19: Secondary | ICD-10-CM

## 2023-06-27 DIAGNOSIS — J9612 Chronic respiratory failure with hypercapnia: Secondary | ICD-10-CM | POA: Diagnosis present

## 2023-06-27 DIAGNOSIS — J9611 Chronic respiratory failure with hypoxia: Secondary | ICD-10-CM | POA: Diagnosis present

## 2023-06-27 DIAGNOSIS — G4710 Hypersomnia, unspecified: Secondary | ICD-10-CM | POA: Diagnosis present

## 2023-06-27 DIAGNOSIS — Z91048 Other nonmedicinal substance allergy status: Secondary | ICD-10-CM

## 2023-06-27 DIAGNOSIS — N390 Urinary tract infection, site not specified: Secondary | ICD-10-CM | POA: Diagnosis not present

## 2023-06-27 DIAGNOSIS — F4312 Post-traumatic stress disorder, chronic: Secondary | ICD-10-CM | POA: Diagnosis present

## 2023-06-27 DIAGNOSIS — Z885 Allergy status to narcotic agent status: Secondary | ICD-10-CM

## 2023-06-27 DIAGNOSIS — Z882 Allergy status to sulfonamides status: Secondary | ICD-10-CM

## 2023-06-27 DIAGNOSIS — Z888 Allergy status to other drugs, medicaments and biological substances status: Secondary | ICD-10-CM

## 2023-06-27 DIAGNOSIS — F419 Anxiety disorder, unspecified: Secondary | ICD-10-CM | POA: Diagnosis present

## 2023-06-27 DIAGNOSIS — Z886 Allergy status to analgesic agent status: Secondary | ICD-10-CM

## 2023-06-27 LAB — PROTIME-INR
INR: 1.1 (ref 0.8–1.2)
Prothrombin Time: 14.1 s (ref 11.4–15.2)

## 2023-06-27 LAB — CBC
HCT: 30.8 % — ABNORMAL LOW (ref 36.0–46.0)
Hemoglobin: 9.7 g/dL — ABNORMAL LOW (ref 12.0–15.0)
MCH: 29.8 pg (ref 26.0–34.0)
MCHC: 31.5 g/dL (ref 30.0–36.0)
MCV: 94.5 fL (ref 80.0–100.0)
Platelets: 257 10*3/uL (ref 150–400)
RBC: 3.26 MIL/uL — ABNORMAL LOW (ref 3.87–5.11)
RDW: 14.6 % (ref 11.5–15.5)
WBC: 21.2 10*3/uL — ABNORMAL HIGH (ref 4.0–10.5)
nRBC: 0 % (ref 0.0–0.2)

## 2023-06-27 LAB — COMPREHENSIVE METABOLIC PANEL WITH GFR
ALT: 11 U/L (ref 0–44)
AST: 16 U/L (ref 15–41)
Albumin: 3.4 g/dL — ABNORMAL LOW (ref 3.5–5.0)
Alkaline Phosphatase: 72 U/L (ref 38–126)
Anion gap: 17 — ABNORMAL HIGH (ref 5–15)
BUN: 62 mg/dL — ABNORMAL HIGH (ref 8–23)
CO2: 18 mmol/L — ABNORMAL LOW (ref 22–32)
Calcium: 9.1 mg/dL (ref 8.9–10.3)
Chloride: 95 mmol/L — ABNORMAL LOW (ref 98–111)
Creatinine, Ser: 6.09 mg/dL — ABNORMAL HIGH (ref 0.44–1.00)
GFR, Estimated: 7 mL/min — ABNORMAL LOW (ref >60)
Glucose, Bld: 87 mg/dL (ref 70–99)
Potassium: 3.1 mmol/L — ABNORMAL LOW (ref 3.5–5.1)
Sodium: 130 mmol/L — ABNORMAL LOW (ref 135–145)
Total Bilirubin: 0.6 mg/dL (ref 0.0–1.2)
Total Protein: 7 g/dL (ref 6.5–8.1)

## 2023-06-27 LAB — URINALYSIS, W/ REFLEX TO CULTURE (INFECTION SUSPECTED)
Bilirubin Urine: NEGATIVE
Glucose, UA: NEGATIVE mg/dL
Ketones, ur: NEGATIVE mg/dL
Nitrite: NEGATIVE
Protein, ur: 30 mg/dL — AB
Specific Gravity, Urine: 1.009 (ref 1.005–1.030)
pH: 5 (ref 5.0–8.0)

## 2023-06-27 LAB — I-STAT CG4 LACTIC ACID, ED: Lactic Acid, Venous: 1.1 mmol/L (ref 0.5–1.9)

## 2023-06-27 LAB — RESP PANEL BY RT-PCR (RSV, FLU A&B, COVID)  RVPGX2
Influenza A by PCR: NEGATIVE
Influenza B by PCR: NEGATIVE
Resp Syncytial Virus by PCR: NEGATIVE
SARS Coronavirus 2 by RT PCR: NEGATIVE

## 2023-06-27 LAB — CBC WITH DIFFERENTIAL/PLATELET
Abs Immature Granulocytes: 0.2 10*3/uL — ABNORMAL HIGH (ref 0.00–0.07)
Basophils Absolute: 0 10*3/uL (ref 0.0–0.1)
Basophils Relative: 0 %
Eosinophils Absolute: 0 10*3/uL (ref 0.0–0.5)
Eosinophils Relative: 0 %
HCT: 37.8 % (ref 36.0–46.0)
Hemoglobin: 11.8 g/dL — ABNORMAL LOW (ref 12.0–15.0)
Immature Granulocytes: 1 %
Lymphocytes Relative: 3 %
Lymphs Abs: 0.7 10*3/uL (ref 0.7–4.0)
MCH: 29.8 pg (ref 26.0–34.0)
MCHC: 31.2 g/dL (ref 30.0–36.0)
MCV: 95.5 fL (ref 80.0–100.0)
Monocytes Absolute: 0.6 10*3/uL (ref 0.1–1.0)
Monocytes Relative: 3 %
Neutro Abs: 18 10*3/uL — ABNORMAL HIGH (ref 1.7–7.7)
Neutrophils Relative %: 93 %
Platelets: 258 10*3/uL (ref 150–400)
RBC: 3.96 MIL/uL (ref 3.87–5.11)
RDW: 14.6 % (ref 11.5–15.5)
WBC: 19.6 10*3/uL — ABNORMAL HIGH (ref 4.0–10.5)
nRBC: 0 % (ref 0.0–0.2)

## 2023-06-27 LAB — CREATININE, SERUM
Creatinine, Ser: 6.06 mg/dL — ABNORMAL HIGH (ref 0.44–1.00)
GFR, Estimated: 7 mL/min — ABNORMAL LOW (ref >60)

## 2023-06-27 LAB — HIV ANTIBODY (ROUTINE TESTING W REFLEX): HIV Screen 4th Generation wRfx: NONREACTIVE

## 2023-06-27 LAB — TROPONIN I (HIGH SENSITIVITY)
Troponin I (High Sensitivity): 19 ng/L — ABNORMAL HIGH (ref <18)
Troponin I (High Sensitivity): 21 ng/L — ABNORMAL HIGH (ref <18)

## 2023-06-27 MED ORDER — METOLAZONE 2.5 MG PO TABS
2.5000 mg | ORAL_TABLET | ORAL | Status: DC
  Administered 2023-06-29 – 2023-07-06 (×4): 2.5 mg via ORAL
  Filled 2023-06-27 (×4): qty 1

## 2023-06-27 MED ORDER — LACTATED RINGERS IV SOLN
150.0000 mL/h | INTRAVENOUS | Status: DC
  Administered 2023-06-27: 150 mL/h via INTRAVENOUS

## 2023-06-27 MED ORDER — HEPARIN SODIUM (PORCINE) 5000 UNIT/ML IJ SOLN
5000.0000 [IU] | Freq: Three times a day (TID) | INTRAMUSCULAR | Status: DC
  Administered 2023-06-27 – 2023-07-06 (×25): 5000 [IU] via SUBCUTANEOUS
  Filled 2023-06-27 (×24): qty 1

## 2023-06-27 MED ORDER — ACETAMINOPHEN 325 MG PO TABS
650.0000 mg | ORAL_TABLET | Freq: Once | ORAL | Status: AC
  Administered 2023-06-27: 650 mg via ORAL
  Filled 2023-06-27: qty 2

## 2023-06-27 MED ORDER — SODIUM CHLORIDE 0.9% FLUSH: 10.0000 mL | INTRAVENOUS | Status: DC | PRN

## 2023-06-27 MED ORDER — ONDANSETRON HCL 4 MG/2ML IJ SOLN: 4.0000 mg | Freq: Four times a day (QID) | INTRAMUSCULAR | Status: DC | PRN

## 2023-06-27 MED ORDER — SODIUM CHLORIDE 0.9 % IV SOLN
1.0000 g | Freq: Once | INTRAVENOUS | Status: AC
  Administered 2023-06-27: 1 g via INTRAVENOUS
  Filled 2023-06-27: qty 10

## 2023-06-27 MED ORDER — OXYCODONE HCL 5 MG PO TABS
5.0000 mg | ORAL_TABLET | ORAL | Status: DC | PRN
  Administered 2023-06-28 – 2023-07-06 (×15): 5 mg via ORAL
  Filled 2023-06-27 (×17): qty 1

## 2023-06-27 MED ORDER — CEFAZOLIN SODIUM-DEXTROSE 1-4 GM/50ML-% IV SOLN
1.0000 g | INTRAVENOUS | Status: DC
  Administered 2023-06-27 – 2023-06-30 (×4): 1 g via INTRAVENOUS
  Filled 2023-06-27 (×6): qty 50

## 2023-06-27 MED ORDER — ONDANSETRON HCL 4 MG PO TABS
4.0000 mg | ORAL_TABLET | Freq: Four times a day (QID) | ORAL | Status: DC | PRN
Start: 2023-06-27 — End: 2023-07-07
  Administered 2023-07-07: 4 mg via ORAL
  Filled 2023-06-27: qty 1

## 2023-06-27 MED ORDER — VANCOMYCIN HCL 1750 MG/350ML IV SOLN
1750.0000 mg | Freq: Once | INTRAVENOUS | Status: AC
  Administered 2023-06-27: 1750 mg via INTRAVENOUS
  Filled 2023-06-27: qty 350

## 2023-06-27 MED ORDER — CHLORHEXIDINE GLUCONATE CLOTH 2 % EX PADS
6.0000 | MEDICATED_PAD | Freq: Every day | CUTANEOUS | Status: DC
  Administered 2023-06-27 – 2023-07-07 (×10): 6 via TOPICAL

## 2023-06-27 MED ORDER — SODIUM CHLORIDE 0.9% FLUSH
10.0000 mL | Freq: Two times a day (BID) | INTRAVENOUS | Status: DC
  Administered 2023-06-29 – 2023-06-30 (×4): 10 mL
  Administered 2023-07-01: 3 mL
  Administered 2023-07-02 – 2023-07-06 (×6): 10 mL

## 2023-06-27 NOTE — ED Notes (Signed)
 CCMD contacted

## 2023-06-27 NOTE — ED Provider Notes (Signed)
 [place] EMERGENCY DEPARTMENT AT [facility] Provider Note   CSN: [id number] Arrival date & time: 06/27/23  1340     History  Chief Complaint  Patient presents with   Needs dialysis     Tiffany [name] is a 63 y.o. female.  Patient is a 63 year old female who presents with increased leg swelling, need for dialysis.  She has a history of schizophrenia as well as end-stage renal disease on dialysis.  She apparently has had some issues with her dialysis center and comes to the ED to get her dialysis.  She said the last time she had dialysis was about 5 or 6 days ago.  She has had some increased leg swelling which typically she gets before her dialysis.  She was noted to have a fever but she has not noted any fevers at home.  She has a little bit of a cough which she says is chronic for her.  She has had a little bit of runny nose and nasal congestion which started a couple days ago.  No vomiting or diarrhea.  No pain in her abdomen.  She does produce urine, she says fairly normally.  She does describe a little bit of burning on urination.       Home Medications Prior to Admission medications   Medication Sig Start Date End Date Taking? Authorizing Provider  albuterol  (VENTOLIN  HFA) 108 (90 Base) MCG/ACT inhaler INHALE 2 PUFFS INTO THE LUNGS EVERY 6 HOURS AS NEEDED FOR WHEEZING OR SHORTNESS OF BREATH 10/24/22   Lavada Porteous, DO  amLODipine  (NORVASC ) 10 MG tablet Take 1 tablet (10 mg total) by mouth daily. 02/26/23     Asenapine  Maleate (SAPHRIS ) 10 MG SUBL Place 1 tablet under the tongue daily.    [provider]  atorvastatin  (LIPITOR ) 20 MG tablet Take 1 tablet (20 mg total) by mouth daily. 10/09/22   McDiarmid, Demetra Filter, MD  calcitRIOL  (ROCALTROL ) 0.5 MCG capsule Take 1 capsule (0.5 mcg total) by mouth daily. 10/09/22   McDiarmid, Demetra Filter, MD  carvedilol  (COREG ) 12.5 MG tablet Take 1 tablet (12.5 mg total) by mouth 2 (two) times daily. 02/26/23     cephALEXin  (KEFLEX )  500 MG capsule Take 1 capsule (500 mg total) by mouth 2 (two) times daily. Patient not taking: Reported on 06/15/2023 06/04/23   Scarlette Currier, MD  cyclobenzaprine  (FLEXERIL ) 10 MG tablet Take 1 tablet (10 mg total) by mouth 3 (three) times daily. 10/09/22   McDiarmid, Demetra Filter, MD  dicyclomine  (BENTYL ) 10 MG capsule Take 1 capsule (10 mg total) by mouth 3 (three) times daily before meals. 10/09/22   McDiarmid, Demetra Filter, MD  DULoxetine  (CYMBALTA ) 30 MG capsule Take 1 capsule (30 mg total) by mouth daily. 03/15/23   Volanda Gruber, NP  epoetin  alfa (EPOGEN ) 10000 UNIT/ML injection Inject 1 mL (10,000 Units total) into the vein every Monday, Wednesday, and Friday with hemodialysis. 11/22/22   Juliana Ocean, DO  escitalopram  (LEXAPRO ) 10 MG tablet Take 1 tablet (10 mg total) by mouth daily. 03/15/23   Volanda Gruber, NP  ezetimibe  (ZETIA ) 10 MG tablet Take 1 tablet (10 mg total) by mouth daily. Please call office to schedule an appt for further refills. Thank you 04/27/23   Elmyra Haggard, MD  ferrous sulfate  325 (65 FE) MG tablet Take 1 tablet (325 mg total) by mouth every other day. Patient taking differently: Take 325 mg by mouth daily. 10/09/22 10/04/23  McDiarmid, Demetra Filter, MD  Fluticasone -Umeclidin-Vilant (TRELEGY ELLIPTA ) 100-62.5-25 MCG/ACT AEPB INHALE 1 PUFF INTO THE LUNGS DAILY 03/20/22   Lavada Porteous, DO  folic acid  (FOLVITE ) 1 MG tablet Take 1 tablet (1 mg total) by mouth daily. 10/09/22   McDiarmid, Demetra Filter, MD  hydrOXYzine  (ATARAX ) 10 MG tablet Take 1 tablet (10 mg total) by mouth 3 (three) times daily as needed for anxiety. 03/14/23   Volanda Gruber, NP  ibuprofen  (ADVIL ) 200 MG tablet Take 400 mg by mouth 3 (three) times daily.    [provider]  lamoTRIgine  (LAMICTAL ) 100 MG tablet Take 1 tablet (100 mg total) by mouth daily. 02/26/23     lidocaine  (LIDODERM ) 5 % Place 1 patch onto the skin daily. Remove & Discard patch within 12 hours or as directed by MD Patient taking  differently: Place 1 patch onto the skin daily. Remove & Discard patch within 12 hours or as directed by MD - Left side 02/20/23   Cala Castleman, MD  loperamide  (IMODIUM ) 2 MG capsule Take 1 capsule (2 mg total) by mouth 2 (two) times daily. Patient taking differently: Take 2 mg by mouth 3 (three) times daily. 10/09/22   McDiarmid, Demetra Filter, MD  Melatonin 10 MG CAPS Take 10 mg by mouth at bedtime as needed (Sleep).    [provider]  metolazone  (ZAROXOLYN ) 2.5 MG tablet Take 2.5 mg by mouth every Monday, Wednesday, and Friday. 11/16/22   [provider]  mirtazapine  (REMERON ) 7.5 MG tablet Take 1 tablet (7.5 mg total) by mouth at bedtime. Patient taking differently: Take 15 mg by mouth at bedtime. 03/14/23   Volanda Gruber, NP  omeprazole  (PRILOSEC) 40 MG capsule Take 1 capsule (40 mg total) by mouth every morning. 10/09/22   McDiarmid, Demetra Filter, MD  prazosin  (MINIPRESS ) 2 MG capsule Take 1 capsule (2 mg total) by mouth at bedtime. Patient not taking: Reported on 03/11/2023 02/26/23     QUEtiapine  (SEROQUEL ) 50 MG tablet Take 1 tablet (50 mg total) by mouth 2 (two) times daily. Patient taking differently: Take 100 mg by mouth 3 (three) times daily. 03/14/23   Volanda Gruber, NP  torsemide  (DEMADEX ) 20 MG tablet Take 1 tablet (20 mg total) by mouth 2 (two) times daily. Patient taking differently: Take 20 mg by mouth in the morning, at noon, and at bedtime. 02/13/23         Allergies    Asa [aspirin ], Hydrocodone , Latuda [lurasidone hcl], Magnesium -containing compounds, Prednisone , Ultram  [tramadol ], Codeine, Desyrel  [trazodone ], Hydralazine , Peanut (diagnostic), Peanut-containing drug products, Topamax  [topiramate ], Sulfa antibiotics, and Tape    Review of Systems   Review of Systems  Constitutional:  Positive for fever. Negative for chills, diaphoresis and fatigue.  HENT:  Positive for congestion and rhinorrhea. Negative for sneezing.   Eyes: Negative.   Respiratory:  Positive  for cough. Negative for chest tightness and shortness of breath.   Cardiovascular:  Positive for leg swelling. Negative for chest pain.  Gastrointestinal:  Negative for abdominal pain, blood in stool, diarrhea, nausea and vomiting.  Genitourinary:  Positive for dysuria. Negative for difficulty urinating, flank pain, frequency and hematuria.  Musculoskeletal:  Negative for arthralgias and back pain.  Skin:  Negative for rash.  Neurological:  Negative for dizziness, speech difficulty, weakness, numbness and headaches.    Physical Exam Updated Vital Signs BP (!) 125/110   Pulse 89   Temp (!) 100.9 F (38.3 C) (Oral)   Resp (!) 25   Ht 5' 2 (1.575 m)  Wt 76.7 kg   SpO2 100%   BMI 30.91 kg/m  Physical Exam Constitutional:      Appearance: [name] well-developed.  HENT:     Head: Normocephalic and atraumatic.  Eyes:     Pupils: Pupils are equal, round, and reactive to light.  Cardiovascular:     Rate and Rhythm: Normal rate and regular rhythm.     Heart sounds: Normal heart sounds.  Pulmonary:     Effort: Pulmonary effort is normal. No respiratory distress.     Breath sounds: Normal breath sounds. No wheezing or rales.  Chest:     Chest wall: No tenderness.  Abdominal:     General: Bowel sounds are normal.     Palpations: Abdomen is soft.     Tenderness: There is no abdominal tenderness. There is no guarding or rebound.  Musculoskeletal:        General: Normal range of motion.     Cervical back: Normal range of motion and neck supple.  Lymphadenopathy:     Cervical: No cervical adenopathy.  Skin:    General: Skin is warm and dry.     Findings: No rash.     Comments: Pitting edema to lower extremities bilaterally.  There are some splotchy erythema and warmth, primarily on the left leg but also on the right lower leg toward the foot and ankle.  Neurological:     Mental Status: [name] alert and oriented to person, place, and time.        ED Results / Procedures /  Treatments   Labs (all labs ordered are listed, but only abnormal results are displayed) Labs Reviewed  COMPREHENSIVE METABOLIC PANEL WITH GFR - Abnormal; Notable for the following components:      Result Value   Sodium 130 (*)    Potassium 3.1 (*)    Chloride 95 (*)    CO2 18 (*)    BUN 62 (*)    Creatinine, Ser 6.09 (*)    Albumin  3.4 (*)    GFR, Estimated 7 (*)    Anion gap 17 (*)    All other components within normal limits  CBC WITH DIFFERENTIAL/PLATELET - Abnormal; Notable for the following components:   WBC 19.6 (*)    Hemoglobin 11.8 (*)    Neutro Abs 18.0 (*)    Abs Immature Granulocytes 0.20 (*)    All other components within normal limits  URINALYSIS, W/ REFLEX TO CULTURE (INFECTION SUSPECTED) - Abnormal; Notable for the following components:   APPearance HAZY (*)    Hgb urine dipstick SMALL (*)    Protein, ur 30 (*)    Leukocytes,Ua MODERATE (*)    Bacteria, UA MANY (*)    All other components within normal limits  TROPONIN I (HIGH SENSITIVITY) - Abnormal; Notable for the following components:   Troponin I (High Sensitivity) 19 (*)    All other components within normal limits  TROPONIN I (HIGH SENSITIVITY) - Abnormal; Notable for the following components:   Troponin I (High Sensitivity) 21 (*)    All other components within normal limits  RESP PANEL BY RT-PCR (RSV, FLU A&B, COVID)  RVPGX2  CULTURE, BLOOD (ROUTINE X 2)  CULTURE, BLOOD (ROUTINE X 2)  URINE CULTURE  PROTIME-INR  I-STAT CG4 LACTIC ACID, ED    EKG EKG Interpretation Date/Time:  Wednesday June 27 2023 14:19:57 EDT Ventricular Rate:  86 PR Interval:  146 QRS Duration:  80 QT Interval:  384 QTC Calculation: 459 R Axis:  12  Text Interpretation: Normal sinus rhythm with sinus arrhythmia Low voltage QRS Borderline ECG When compared with ECG of 21-Jun-2023 22:50, PREVIOUS ECG IS PRESENT since last tracing no significant change Confirmed by Hershel Los [phone]) on 06/27/2023 3:12:17  PM  Radiology DG Chest Port 1 View Result Date: 06/27/2023 CLINICAL DATA:  Chest pain and shortness of breath. EXAM: PORTABLE CHEST 1 VIEW COMPARISON:  June 21, 2023. FINDINGS: Stable cardiomegaly. Right internal jugular dialysis catheter is unchanged. Both lungs are clear. Old left rib fractures. IMPRESSION: No active disease. Electronically Signed   By: Rosalene Colon M.D.   On: 06/27/2023 15:30    Procedures Procedures    Medications Ordered in ED Medications  vancomycin  (VANCOREADY) IVPB 1750 mg/350 mL (1,750 mg Intravenous New Bag/Given 06/27/23 1756)  acetaminophen  (TYLENOL ) tablet 650 mg (650 mg Oral Given 06/27/23 1708)  cefTRIAXone  (ROCEPHIN ) 1 g in sodium chloride  0.9 % 100 mL IVPB (0 g Intravenous Stopped 06/27/23 1826)    ED Course/ Medical Decision Making/ A&P                                 Medical Decision Making Risk OTC drugs. Prescription drug management. Decision regarding hospitalization.  CRITICAL CARE Performed by: Hershel Los Total critical care time: 70 minutes Critical care time was exclusive of separately billable procedures and treating other patients. Critical care was necessary to treat or prevent imminent or life-threatening deterioration. Critical care was time spent personally by me on the following activities: development of treatment plan with patient and/or surrogate as well as nursing, discussions with consultants, evaluation of patient's response to treatment, examination of patient, obtaining history from patient or surrogate, ordering and performing treatments and interventions, ordering and review of laboratory studies, ordering and review of radiographic studies, pulse oximetry and re-evaluation of patient's condition.  This patient presents to the ED for concern of leg swelling, this involves an extensive number of treatment options, and is a complaint that carries with it a high risk of complications and morbidity.  I considered the  following differential and admission for this acute, potentially life threatening condition.  The differential diagnosis includes CHF, renal disease, cellulitis, sepsis  MDM:    Patient is 63 year old who presents needing dialysis.  However she has noted to be febrile with a temperature of 102.  She has a normal blood pressure.  She does have some suggestions of possible cellulitis to her lower extremities.  She also has evidence of UTI.  No evidence of pneumonia.  She does meet criteria for sepsis with an elevated WBC count.  Her lactate is normal.  Discussed with Dr. Carlton Chick who will admit the patient.  Also discussed with nephrology,Dr. Edson Graces, he will plan for patient to be dialyzed tomorrow.  She does not have an emergent need for dialysis today.  No signs of pulmonary edema.  Her potassium is slightly on the low side.  She was given IV antibiotics for treatment of her sepsis.  Will plan admission.  (Labs, imaging, consults)  Labs: I Ordered, and personally interpreted labs.  The pertinent results include:  elevated WBC, normal lactate, elevated creatinine, slightly low potassium  Imaging Studies ordered: I ordered imaging studies including chest x-ray I independently visualized and interpreted imaging. I agree with the radiologist interpretation  Additional history obtained from chart.  External records from outside source obtained and reviewed including prior ED notes  Cardiac Monitoring: The patient was  maintained on a cardiac monitor.  If on the cardiac monitor, I personally viewed and interpreted the cardiac monitored which showed an underlying rhythm of: Sinus rhythm  Reevaluation: After the interventions noted above, I reevaluated the patient and found that they have :improved  Social Determinants of Health:  mental health  Disposition: Admitted  Co morbidities that complicate the patient evaluation  Past Medical History:  Diagnosis Date   Abdominal pain    Acute  encephalopathy 11/21/2017   Acute exacerbation of CHF (congestive heart failure) (HCC) 03/03/2022   Acute GI bleeding 03/29/2021   Acute metabolic encephalopathy 02/20/2020   Aggressive behavior    Agitation 11/22/2017   Anoxic brain injury (HCC) 09/08/2016   C. Arrest due to respiratory failure and COPD exacerbation   Anxiety    Arthritis    all over (04/10/2016)   Asthma 10/18/2010   Binge eating disorder    Blood loss anemia 04/24/2011   CAP (community acquired pneumonia) 06/22/2015   Cardiac arrest (HCC) 09/08/2016   PEA   Carotid artery stenosis    1-39% bilateral by dopplers 11/2016   Chronic diastolic (congestive) heart failure (HCC)    Chronic pain syndrome 06/18/2012   Chronic post-traumatic stress disorder (PTSD) 05/27/2018   Chronic respiratory failure with hypoxia and hypercapnia (HCC) 06/22/2015   TRILOGY Vent >AVAPA-ES., Vt target 200-400, Max P 30 , PS max 20 , PS min 6-10 , E Max 6, E Min 4, Rate Auto AVAPS Rate 2 (titrate for pt comfort) , bleed O2 at 5l/m continuous flow .    Closed displaced fracture of fifth metacarpal bone 03/21/2018   Cocaine use disorder, severe, in sustained remission (HCC) 12/17/2015   Complication of anesthesia    decreased bp, decreased heart rate   COPD (chronic obstructive pulmonary disease) (HCC) 07/08/2014   Delusional disorder, persecutory type (HCC) 06/26/2021   Depression    Diabetic neuropathy (HCC) 04/24/2011   Difficulty with speech 01/24/2018   Disorder of nervous system    Drug abuse (HCC) 11/21/2017   Dyslipidemia 04/24/2011   E. coli UTI 02/20/2020   Elevated troponin 04/28/2012   Emphysema    Encephalopathy 11/21/2017   Essential hypertension 03/22/2016   Facet arthropathy, lumbar 06/12/2022   Fibula fracture 07/10/2016   Frequent falls 10/11/2017   GERD (gastroesophageal reflux disease)    GI bleed 03/30/2021   Gout 04/11/2017   Heart attack (HCC) 1980s   Hematochezia    History of blood transfusion 1994    couldn't stop bleeding from my period   History of drug abuse in remission (HCC) 11/28/2015   Quit in 2017   Hyperlipidemia 03/31/2021   Hyperlipidemia LDL goal <70    Hypocalcemia    Hypokalemia    Hypomagnesemia    Incontinence    Manic depression (HCC)    Morbid obesity (HCC) 10/18/2010   Obstructive sleep apnea 10/18/2010   On home oxygen  therapy    6L; 24/7 (04/10/2016)   OSA on CPAP    wear mask sometimes (04/10/2016)   Painless rectal bleeding 12/31/2015   Paranoid (HCC)    sometimes; I'm on RX for it (04/10/2016)   Periumbilical abdominal pain    Prolonged Q-T interval on ECG    Rectal bleeding 12/31/2015   Recurrent syncope 07/09/2021   Rhabdomyolysis 06/16/2021   Schizoaffective disorder, bipolar type (HCC) 04/05/2018   Seasonal allergies    Seborrheic keratoses 12/31/2013   Seizures (HCC)    don't know what kind; last one was ~ 1 yr  ago (04/10/2016)   Sinus bradycardia    Skin ulcer of sacrum, limited to breakdown of skin (HCC) 03/08/2022   Spondylolisthesis at L3-L4 level 06/12/2022   Stroke [facility]) 1980s   denies residual on 04/10/2016   Thrush 09/19/2013   Type 2 diabetes mellitus (HCC) 10/18/2010     Medicines Meds ordered this encounter  Medications   acetaminophen  (TYLENOL ) tablet 650 mg   cefTRIAXone  (ROCEPHIN ) 1 g in sodium chloride  0.9 % 100 mL IVPB    Antibiotic Indication::   Cellulitis   vancomycin  (VANCOREADY) IVPB 1750 mg/350 mL    Indication::   Cellulitis    I have reviewed the patients home medicines and have made adjustments as needed  Problem List / ED Course: Problem List Items Addressed This Visit       Other   * (Principal) Sepsis (HCC) - Primary   Other Visit Diagnoses       Cellulitis of left lower extremity         Acute cystitis without hematuria         ESRD (end stage renal disease) (HCC)                 Final Clinical Impression(s) / ED Diagnoses Final diagnoses:  Sepsis without acute organ dysfunction,  due to unspecified organism (HCC)  Cellulitis of left lower extremity  Acute cystitis without hematuria  ESRD (end stage renal disease) (HCC)    Rx / DC Orders ED Discharge Orders     None         Hershel Los, MD 06/27/23 1923

## 2023-06-27 NOTE — H&P (Signed)
 History and Physical    Patient: Tiffany [name] WUJ:[id number] DOB: 09-07-1960 DOA: 06/27/2023 DOS: the patient was seen and examined on 06/27/2023 PCP: Danella Dunn, MD  Patient coming from: Home  Chief Complaint:  Chief Complaint  Patient presents with   Needs dialysis    HPI: Tiffany [name] is a 63 y.o. female with medical history significant of end-stage renal disease on hemodialysis who usually comes to the ER for her hemodialysis, chronic diastolic heart failure, schizoaffective disorder, anemia of chronic disease, morbid obesity, obstructive sleep apnea, GERD, tobacco abuse, history of seizure disorder who presented to the ER again with complaint of leg swelling.  Patient last dialysis was about 5 to 6 days ago.  The legs are red and she has had some fever.  Also some cough which is chronic.  Patient had recent runny nose but no nausea vomiting or diarrhea.  [name] not hypoxic.  [name] felt some pain in the legs that are swollen.  Patient was seen and evaluated.  Appears to have met sepsis criteria most likely cellulitis.  Versus UTI.  Patient complained of dysuria.  Review of Systems: As mentioned in the history of present illness. All other systems reviewed and are negative. Past Medical History:  Diagnosis Date   Abdominal pain    Acute encephalopathy 11/21/2017   Acute exacerbation of CHF (congestive heart failure) (HCC) 03/03/2022   Acute GI bleeding 03/29/2021   Acute metabolic encephalopathy 02/20/2020   Aggressive behavior    Agitation 11/22/2017   Anoxic brain injury (HCC) 09/08/2016   C. Arrest due to respiratory failure and COPD exacerbation   Anxiety    Arthritis    all over (04/10/2016)   Asthma 10/18/2010   Binge eating disorder    Blood loss anemia 04/24/2011   CAP (community acquired pneumonia) 06/22/2015   Cardiac arrest (HCC) 09/08/2016   PEA   Carotid artery stenosis    1-39% bilateral by dopplers 11/2016   Chronic diastolic (congestive) heart failure  (HCC)    Chronic pain syndrome 06/18/2012   Chronic post-traumatic stress disorder (PTSD) 05/27/2018   Chronic respiratory failure with hypoxia and hypercapnia (HCC) 06/22/2015   TRILOGY Vent >AVAPA-ES., Vt target 200-400, Max P 30 , PS max 20 , PS min 6-10 , E Max 6, E Min 4, Rate Auto AVAPS Rate 2 (titrate for pt comfort) , bleed O2 at 5l/m continuous flow .    Closed displaced fracture of fifth metacarpal bone 03/21/2018   Cocaine use disorder, severe, in sustained remission (HCC) 12/17/2015   Complication of anesthesia    decreased bp, decreased heart rate   COPD (chronic obstructive pulmonary disease) (HCC) 07/08/2014   Delusional disorder, persecutory type (HCC) 06/26/2021   Depression    Diabetic neuropathy (HCC) 04/24/2011   Difficulty with speech 01/24/2018   Disorder of nervous system    Drug abuse (HCC) 11/21/2017   Dyslipidemia 04/24/2011   E. coli UTI 02/20/2020   Elevated troponin 04/28/2012   Emphysema    Encephalopathy 11/21/2017   Essential hypertension 03/22/2016   Facet arthropathy, lumbar 06/12/2022   Fibula fracture 07/10/2016   Frequent falls 10/11/2017   GERD (gastroesophageal reflux disease)    GI bleed 03/30/2021   Gout 04/11/2017   Heart attack (HCC) 1980s   Hematochezia    History of blood transfusion 1994   couldn't stop bleeding from my period   History of drug abuse in remission (HCC) 11/28/2015   Quit in 2017   Hyperlipidemia 03/31/2021  Hyperlipidemia LDL goal <70    Hypocalcemia    Hypokalemia    Hypomagnesemia    Incontinence    Manic depression (HCC)    Morbid obesity (HCC) 10/18/2010   Obstructive sleep apnea 10/18/2010   On home oxygen  therapy    6L; 24/7 (04/10/2016)   OSA on CPAP    wear mask sometimes (04/10/2016)   Painless rectal bleeding 12/31/2015   Paranoid (HCC)    sometimes; I'm on RX for it (04/10/2016)   Periumbilical abdominal pain    Prolonged Q-T interval on ECG    Rectal bleeding 12/31/2015   Recurrent  syncope 07/09/2021   Rhabdomyolysis 06/16/2021   Schizoaffective disorder, bipolar type (HCC) 04/05/2018   Seasonal allergies    Seborrheic keratoses 12/31/2013   Seizures (HCC)    don't know what kind; last one was ~ 1 yr ago (04/10/2016)   Sinus bradycardia    Skin ulcer of sacrum, limited to breakdown of skin (HCC) 03/08/2022   Spondylolisthesis at L3-L4 level 06/12/2022   Stroke (HCC) 1980s   denies residual on 04/10/2016   Thrush 09/19/2013   Type 2 diabetes mellitus (HCC) 10/18/2010   Past Surgical History:  Procedure Laterality Date   A/V FISTULAGRAM N/A 02/01/2023   Procedure: A/V Fistulagram;  Surgeon: Baron Border, MD;  Location: [facility];  Service: Cardiovascular;  Laterality: N/A;   AV FISTULA PLACEMENT Left 12/21/2022   Procedure: INSERTION OF LEFT ARM ARTERIOVENOUS (AV) GORE-TEX GRAFT;  Surgeon: Carlene Che, MD;  Location: [facility] OR;  Service: Vascular;  Laterality: Left;   CESAREAN SECTION  1997   COLONOSCOPY WITH PROPOFOL  N/A 04/01/2021   Procedure: COLONOSCOPY WITH PROPOFOL ;  Surgeon: Alvis Jourdain, MD;  Location: [facility] ENDOSCOPY;  Service: Gastroenterology;  Laterality: N/A;  Rectal bleeding with drop in hemoglobin to 7.2 g/dL   HERNIA REPAIR     IR CHOLANGIOGRAM EXISTING TUBE  07/20/2016   IR PERC CHOLECYSTOSTOMY  05/10/2016   IR RADIOLOGIST EVAL & MGMT  06/08/2016   IR RADIOLOGIST EVAL & MGMT  06/29/2016   IR SINUS/FIST TUBE CHK-NON GI  07/12/2016   RIGHT/LEFT HEART CATH AND CORONARY ANGIOGRAPHY N/A 06/19/2017   Procedure: RIGHT/LEFT HEART CATH AND CORONARY ANGIOGRAPHY;  Surgeon: Mardell Shade, MD;  Location: [facility];  Service: Cardiovascular;  Laterality: N/A;   TIBIA IM NAIL INSERTION Right 07/12/2016   Procedure: INTRAMEDULLARY (IM) NAIL RIGHT TIBIA;  Surgeon: Wes Hamman, MD;  Location: [facility] OR;  Service: Orthopedics;  Laterality: Right;   UMBILICAL HERNIA REPAIR  ~ 1963   that's why I don't have a belly button   VAGINAL  HYSTERECTOMY     Social History:  reports that she quit smoking about 7 years ago. Her smoking use included cigarettes. She started smoking about 46 years ago. She has a 58.6 pack-year smoking history. She has been exposed to tobacco smoke. She has never used smokeless tobacco. She reports that she does not currently use drugs after having used the following drugs: Cocaine. She reports that she does not drink alcohol.  Allergies  Allergen Reactions   Raynaldo Call ] Other (See Comments)    GI bleeds   Hydrocodone  Shortness Of Breath and Hives   Latuda [Lurasidone Hcl] Anaphylaxis   Magnesium -Containing Compounds Anaphylaxis and Other (See Comments)    Tolerated Ensure   Prednisone  Anaphylaxis, Swelling and Other (See Comments)    Swelling of tongue, lips, throat   Ultram  [Tramadol ] Anaphylaxis, Swelling and Rash   Codeine Nausea And  Vomiting and Rash   Desyrel  [Trazodone ] Other (See Comments)    Paranoia    Hydralazine  Other (See Comments)    Muscle spasms   Peanut (Diagnostic) Hives and Other (See Comments)    Raw peanuts only  OK to eat peanut butter.   Peanut-Containing Drug Products Hives    Raw peanuts, but can eat peanut butter   Topamax  [Topiramate ] Other (See Comments)    Paranoia   Sulfa Antibiotics Itching   Tape Rash    Family History  Problem Relation Age of Onset   Cancer Mother        lung   Depression Mother    Cancer Father        prostate   Depression Sister    Anxiety disorder Sister    Schizophrenia Sister    Bipolar disorder Sister    Depression Sister    Depression Brother    Heart failure Other        cousin    Prior to Admission medications   Medication Sig Start Date End Date Taking? Authorizing Provider  albuterol  (VENTOLIN  HFA) 108 (90 Base) MCG/ACT inhaler INHALE 2 PUFFS INTO THE LUNGS EVERY 6 HOURS AS NEEDED FOR WHEEZING OR SHORTNESS OF BREATH 10/24/22   Lavada Porteous, DO  amLODipine  (NORVASC ) 10 MG tablet Take 1 tablet (10 mg total) by  mouth daily. 02/26/23     Asenapine  Maleate (SAPHRIS ) 10 MG SUBL Place 1 tablet under the tongue daily.    [provider]  atorvastatin  (LIPITOR ) 20 MG tablet Take 1 tablet (20 mg total) by mouth daily. 10/09/22   McDiarmid, Demetra Filter, MD  calcitRIOL  (ROCALTROL ) 0.5 MCG capsule Take 1 capsule (0.5 mcg total) by mouth daily. 10/09/22   McDiarmid, Demetra Filter, MD  carvedilol  (COREG ) 12.5 MG tablet Take 1 tablet (12.5 mg total) by mouth 2 (two) times daily. 02/26/23     cephALEXin  (KEFLEX ) 500 MG capsule Take 1 capsule (500 mg total) by mouth 2 (two) times daily. Patient not taking: Reported on 06/15/2023 06/04/23   Scarlette Currier, MD  cyclobenzaprine  (FLEXERIL ) 10 MG tablet Take 1 tablet (10 mg total) by mouth 3 (three) times daily. 10/09/22   McDiarmid, Demetra Filter, MD  dicyclomine  (BENTYL ) 10 MG capsule Take 1 capsule (10 mg total) by mouth 3 (three) times daily before meals. 10/09/22   McDiarmid, Demetra Filter, MD  DULoxetine  (CYMBALTA ) 30 MG capsule Take 1 capsule (30 mg total) by mouth daily. 03/15/23   Volanda Gruber, NP  epoetin  alfa (EPOGEN ) 10000 UNIT/ML injection Inject 1 mL (10,000 Units total) into the vein every Monday, Wednesday, and Friday with hemodialysis. 11/22/22   Juliana Ocean, DO  escitalopram  (LEXAPRO ) 10 MG tablet Take 1 tablet (10 mg total) by mouth daily. 03/15/23   Volanda Gruber, NP  ezetimibe  (ZETIA ) 10 MG tablet Take 1 tablet (10 mg total) by mouth daily. Please call office to schedule an appt for further refills. Thank you 04/27/23   Elmyra Haggard, MD  ferrous sulfate  325 (65 FE) MG tablet Take 1 tablet (325 mg total) by mouth every other day. Patient taking differently: Take 325 mg by mouth daily. 10/09/22 10/04/23  McDiarmid, Demetra Filter, MD  Fluticasone -Umeclidin-Vilant (TRELEGY ELLIPTA ) 100-62.5-25 MCG/ACT AEPB INHALE 1 PUFF INTO THE LUNGS DAILY 03/20/22   Lavada Porteous, DO  folic acid  (FOLVITE ) 1 MG tablet Take 1 tablet (1 mg total) by mouth daily. 10/09/22   McDiarmid, Demetra Filter, MD   hydrOXYzine  (ATARAX ) 10 MG tablet Take  1 tablet (10 mg total) by mouth 3 (three) times daily as needed for anxiety. 03/14/23   Volanda Gruber, NP  ibuprofen  (ADVIL ) 200 MG tablet Take 400 mg by mouth 3 (three) times daily.    [provider]  lamoTRIgine  (LAMICTAL ) 100 MG tablet Take 1 tablet (100 mg total) by mouth daily. 02/26/23     lidocaine  (LIDODERM ) 5 % Place 1 patch onto the skin daily. Remove & Discard patch within 12 hours or as directed by MD Patient taking differently: Place 1 patch onto the skin daily. Remove & Discard patch within 12 hours or as directed by MD - Left side 02/20/23   Cala Castleman, MD  loperamide  (IMODIUM ) 2 MG capsule Take 1 capsule (2 mg total) by mouth 2 (two) times daily. Patient taking differently: Take 2 mg by mouth 3 (three) times daily. 10/09/22   McDiarmid, Demetra Filter, MD  Melatonin 10 MG CAPS Take 10 mg by mouth at bedtime as needed (Sleep).    [provider]  metolazone  (ZAROXOLYN ) 2.5 MG tablet Take 2.5 mg by mouth every Monday, Wednesday, and Friday. 11/16/22   [provider]  mirtazapine  (REMERON ) 7.5 MG tablet Take 1 tablet (7.5 mg total) by mouth at bedtime. Patient taking differently: Take 15 mg by mouth at bedtime. 03/14/23   Volanda Gruber, NP  omeprazole  (PRILOSEC) 40 MG capsule Take 1 capsule (40 mg total) by mouth every morning. 10/09/22   McDiarmid, Demetra Filter, MD  prazosin  (MINIPRESS ) 2 MG capsule Take 1 capsule (2 mg total) by mouth at bedtime. Patient not taking: Reported on 03/11/2023 02/26/23     QUEtiapine  (SEROQUEL ) 50 MG tablet Take 1 tablet (50 mg total) by mouth 2 (two) times daily. Patient taking differently: Take 100 mg by mouth 3 (three) times daily. 03/14/23   Volanda Gruber, NP  torsemide  (DEMADEX ) 20 MG tablet Take 1 tablet (20 mg total) by mouth 2 (two) times daily. Patient taking differently: Take 20 mg by mouth in the morning, at noon, and at bedtime. 02/13/23       Physical Exam: Vitals:   06/27/23  1715 06/27/23 1745 06/27/23 1800 06/27/23 1915  BP: (!) 187/123 (!) 155/81 (!) 125/110 (!) 130/92  Pulse: 92 86 89 78  Resp: 17 (!) 32 (!) 25 (!) 24  Temp:      TempSrc:      SpO2: 100% 100% 100% 95%  Weight:      Height:       Constitutional: Chronically ill looking and NAD, calm, comfortable Eyes: PERRL, lids and conjunctivae normal ENMT: Mucous membranes are moist. Posterior pharynx clear of any exudate or lesions.Normal dentition.  Neck: normal, supple, no masses, no thyromegaly Respiratory: clear to auscultation bilaterally, no wheezing, no crackles. Normal respiratory effort. No accessory muscle use.  Cardiovascular: Regular rate and rhythm, no murmurs / rubs / gallops.  2+ extremity edema. 2+ pedal pulses. No carotid bruits.  Abdomen: no tenderness, no masses palpated. No hepatosplenomegaly. Bowel sounds positive.  Musculoskeletal: Good range of motion, no joint swelling or tenderness, Skin: Bilateral lower extremity red, no ulceration no induration Neurologic: CN 2-12 grossly intact. Sensation intact, DTR normal. Strength 5/5 in all 4.  Psychiatric: Normal judgment and insight. Alert and oriented x 3.  Depressed mood mood  Data Reviewed:  Temperature 102.2, blood pressure 187/123, pulse 92 respiratory to oxygen  90% on room air.  White count is 21.2 thousand hemoglobin 9.7 sodium 130 potassium 3 point chloride 95 CO2 of 18  glucose 87.  BUN 62 and creatinine is 6.09 with a gap of 17 albumin  3.4.  Urinalysis showed moderate leukocyte WBC 11-20 with many bacteria  Assessment and Plan:  #1 sepsis due to cellulitis and UTI: Patient will be admitted.  Initiate IV vancomycin  and cefepime .  Follow urine cultures and blood cultures.  Elevate the feet.  Antibiotics will be adjusted.  #2 end-stage renal disease: Patient will be dialyzed tomorrow.  Continue to monitor  #3 GERD: Continue with PPIs  #4 schizoaffective disorder: Continue home regimen.  #5 history of anoxic brain injury:  Stable.  #6 morbid obesity: Stable.  Dietary counseling  Obstructive sleep apnea: No CPAP at home.  Continue to monitor    Advance Care Planning:   Code Status: Full Code   Consults: Nephrology  Family Communication: No family at bedside  Severity of Illness: The appropriate patient status for this patient is INPATIENT. Inpatient status is judged to be reasonable and necessary in order to provide the required intensity of service to ensure the patient's safety. The patient's presenting symptoms, physical exam findings, and initial radiographic and laboratory data in the context of their chronic comorbidities is felt to place them at high risk for further clinical deterioration. Furthermore, it is not anticipated that the patient will be medically stable for discharge from the hospital within 2 midnights of admission.   * I certify that at the point of admission it is my clinical judgment that the patient will require inpatient hospital care spanning beyond 2 midnights from the point of admission due to high intensity of service, high risk for further deterioration and high frequency of surveillance required.*  AuthorCarolin Chyle, MD 06/27/2023 8:20 PM  For on call review [email or web address].

## 2023-06-27 NOTE — Progress Notes (Signed)
 ED Pharmacy Antibiotic Sign Off An antibiotic consult was received from an ED provider for vancomycin  per pharmacy dosing for cellulitis. A chart review was completed to assess appropriateness.   The following one time order(s) were placed:  Vancomycin  1750mg  IV x 1   Further antibiotic and/or antibiotic pharmacy consults should be ordered by the admitting provider if indicated.   Thank you for allowing pharmacy to be a part of this patient's care.   Harvest Lineman, [facility]  Clinical Pharmacist 06/27/23 5:41 PM

## 2023-06-27 NOTE — Progress Notes (Signed)
 PHARMACY NOTE:  ANTIMICROBIAL RENAL DOSAGE ADJUSTMENT  Current antimicrobial regimen includes a mismatch between antimicrobial dosage and estimated renal function.  As per policy approved by the Pharmacy & Therapeutics and Medical Executive Committees, the antimicrobial dosage will be adjusted accordingly.  Current antimicrobial dosage:  cefazolin  2g IV Q8H x 7 days   Indication: cellulitis   Renal Function:  Estimated Creatinine Clearance: 8.9 mL/min (A) (by C-G formula based on SCr of 6.09 mg/dL (H)). [x]      On intermittent HD, scheduled: []      On CRRT    Antimicrobial dosage has been changed to:  cefazolin  1 g IV Q24H   Additional comments:   Thank you for allowing pharmacy to be a part of this patient's care.  Chrystie Crass, PharmD Clinical Pharmacist  06/27/2023 8:38 PM

## 2023-06-27 NOTE — ED Provider Triage Note (Signed)
 Emergency Medicine Provider Triage Evaluation Note  Tiffany [name] , a 63 y.o. female  was evaluated in triage.  Pt complains of need for dialysis.  States low treatment was this past Friday.  States she routinely comes here for her dialysis.  Endorse seeing generalized chest pain, bruising in the left leg, bilateral lower extremity edema and shortness of breath.  She also mention that she is having cough and urinary incontinence but the symptoms are not necessarily new.  Review of Systems  Positive: See above Negative: See above  Physical Exam  BP (!) 155/92 (BP Location: Right Arm)   Pulse 88   Temp (!) 102.2 F (39 C)   Resp (!) 28   Ht 5' 2 (1.575 m)   Wt 76.7 kg   SpO2 95%   BMI 30.91 kg/m  Gen:   Awake, no distress   Resp:  Normal effort  MSK:   Moves extremities without difficulty  Other:    Medical Decision Making  Medically screening exam initiated at 2:03 PM.  Appropriate orders placed.  Tiffany [name] was informed that the remainder of the evaluation will be completed by another provider, this initial triage assessment does not replace that evaluation, and the importance of remaining in the ED until their evaluation is complete.  Work up started   Tiffany Mcmurray, PA-C 06/27/23 1405

## 2023-06-27 NOTE — ED Triage Notes (Addendum)
 Pt states she is here to get dialysis, last full tx was Friday. C/O bilateral leg pain and bruising/CP/shob

## 2023-06-27 NOTE — ED Notes (Signed)
 Pt undressed for provider to examine legs.

## 2023-06-27 NOTE — ED Notes (Signed)
 37M secretary called informing of arranging pts transport

## 2023-06-27 NOTE — ED Notes (Signed)
 Pt off bedpan, UA collected

## 2023-06-27 NOTE — ED Notes (Signed)
 Unable to obtain labs

## 2023-06-28 DIAGNOSIS — A419 Sepsis, unspecified organism: Secondary | ICD-10-CM | POA: Diagnosis not present

## 2023-06-28 LAB — CBC
HCT: 34.9 % — ABNORMAL LOW (ref 36.0–46.0)
HCT: 29.6 % — ABNORMAL LOW (ref 36.0–46.0)
Hemoglobin: 10.9 g/dL — ABNORMAL LOW (ref 12.0–15.0)
Hemoglobin: 9.4 g/dL — ABNORMAL LOW (ref 12.0–15.0)
MCH: 30 pg (ref 26.0–34.0)
MCH: 30.2 pg (ref 26.0–34.0)
MCHC: 31.2 g/dL (ref 30.0–36.0)
MCHC: 31.8 g/dL (ref 30.0–36.0)
MCV: 96.1 fL (ref 80.0–100.0)
MCV: 95.2 fL (ref 80.0–100.0)
Platelets: 245 10*3/uL (ref 150–400)
Platelets: 237 10*3/uL (ref 150–400)
RBC: 3.63 MIL/uL — ABNORMAL LOW (ref 3.87–5.11)
RBC: 3.11 MIL/uL — ABNORMAL LOW (ref 3.87–5.11)
RDW: 14.5 % (ref 11.5–15.5)
RDW: 14.7 % (ref 11.5–15.5)
WBC: 15.2 10*3/uL — ABNORMAL HIGH (ref 4.0–10.5)
WBC: 13.8 10*3/uL — ABNORMAL HIGH (ref 4.0–10.5)
nRBC: 0 % (ref 0.0–0.2)
nRBC: 0 % (ref 0.0–0.2)

## 2023-06-28 LAB — URINE CULTURE: Culture: >=100000 — AB

## 2023-06-28 LAB — COMPREHENSIVE METABOLIC PANEL WITH GFR
ALT: 9 U/L (ref 0–44)
AST: 13 U/L — ABNORMAL LOW (ref 15–41)
Albumin: 2.7 g/dL — ABNORMAL LOW (ref 3.5–5.0)
Alkaline Phosphatase: 78 U/L (ref 38–126)
Anion gap: 16 — ABNORMAL HIGH (ref 5–15)
BUN: 61 mg/dL — ABNORMAL HIGH (ref 8–23)
CO2: 19 mmol/L — ABNORMAL LOW (ref 22–32)
Calcium: 8.7 mg/dL — ABNORMAL LOW (ref 8.9–10.3)
Chloride: 99 mmol/L (ref 98–111)
Creatinine, Ser: 5.91 mg/dL — ABNORMAL HIGH (ref 0.44–1.00)
GFR, Estimated: 7 mL/min — ABNORMAL LOW (ref >60)
Glucose, Bld: 78 mg/dL (ref 70–99)
Potassium: 3 mmol/L — ABNORMAL LOW (ref 3.5–5.1)
Sodium: 134 mmol/L — ABNORMAL LOW (ref 135–145)
Total Bilirubin: 0.7 mg/dL (ref 0.0–1.2)
Total Protein: 6 g/dL — ABNORMAL LOW (ref 6.5–8.1)

## 2023-06-28 LAB — HEPATITIS B SURFACE ANTIGEN: Hepatitis B Surface Ag: NONREACTIVE

## 2023-06-28 LAB — CORTISOL-AM, BLOOD: Cortisol - AM: 9.2 ug/dL (ref 6.7–22.6)

## 2023-06-28 LAB — PHOSPHORUS: Phosphorus: 5.5 mg/dL — ABNORMAL HIGH (ref 2.5–4.6)

## 2023-06-28 LAB — PROTIME-INR
INR: 1.2 (ref 0.8–1.2)
Prothrombin Time: 15.5 s — ABNORMAL HIGH (ref 11.4–15.2)

## 2023-06-28 MED ORDER — CHLORHEXIDINE GLUCONATE CLOTH 2 % EX PADS
6.0000 | MEDICATED_PAD | Freq: Every day | CUTANEOUS | Status: DC
Start: 2023-06-28 — End: 2023-07-07
  Administered 2023-06-28 – 2023-07-07 (×9): 6 via TOPICAL

## 2023-06-28 MED ORDER — ANTICOAGULANT SODIUM CITRATE 4% (200MG/5ML) IV SOLN: 5.0000 mL | Status: DC | PRN

## 2023-06-28 MED ORDER — ALTEPLASE 2 MG IJ SOLR
2.0000 mg | Freq: Once | INTRAMUSCULAR | Status: DC | PRN
Start: 2023-06-28 — End: 2023-06-28

## 2023-06-28 MED ORDER — POTASSIUM CHLORIDE 20 MEQ PO PACK
40.0000 meq | PACK | Freq: Once | ORAL | Status: AC
  Administered 2023-06-28: 40 meq via ORAL
  Filled 2023-06-28: qty 2

## 2023-06-28 MED ORDER — LIDOCAINE HCL (PF) 1 % IJ SOLN
5.0000 mL | INTRAMUSCULAR | Status: DC | PRN
Start: 2023-06-28 — End: 2023-06-28

## 2023-06-28 MED ORDER — LIDOCAINE-PRILOCAINE 2.5-2.5 % EX CREA: 1.0000 | TOPICAL_CREAM | CUTANEOUS | Status: DC | PRN

## 2023-06-28 MED ORDER — HEPARIN SODIUM (PORCINE) 1000 UNIT/ML DIALYSIS
1000.0000 [IU] | INTRAMUSCULAR | Status: DC | PRN
  Administered 2023-06-28: 3200 [IU]

## 2023-06-28 MED ORDER — HEPARIN SODIUM (PORCINE) 1000 UNIT/ML DIALYSIS
20.0000 [IU]/kg | INTRAMUSCULAR | Status: DC | PRN
  Administered 2023-06-28: 1500 [IU] via INTRAVENOUS_CENTRAL

## 2023-06-28 MED ORDER — PENTAFLUOROPROP-TETRAFLUOROETH EX AERO: 1.0000 | INHALATION_SPRAY | CUTANEOUS | Status: DC | PRN

## 2023-06-28 MED ORDER — HEPARIN SODIUM (PORCINE) 1000 UNIT/ML IJ SOLN
INTRAMUSCULAR | Status: AC
  Filled 2023-06-28: qty 4

## 2023-06-28 MED ORDER — HEPARIN SODIUM (PORCINE) 1000 UNIT/ML IJ SOLN
INTRAMUSCULAR | Status: AC
  Filled 2023-06-28: qty 2

## 2023-06-28 NOTE — Procedures (Signed)
 Received patient in bed to unit.  Alert and oriented.  Informed consent signed and in chart.   TX duration: 3.5 hours  Patient tolerated well.  Transported back to the room  Alert, without acute distress.  Hand-off given to patient's nurse.   Access used: right cath Access issues: none  Total UF removed: 3 liters Medication(s) given: heparin    Clover Dao, RN [facility]

## 2023-06-28 NOTE — Consult Note (Addendum)
 [facility] Nephrology Consult Note: Reason for Consult: To manage dialysis and dialysis related needs Referring Physician: Dr.  Hammans, Jillyn Motto  HPI:  Tiffany [name] is an 63 y.o. female with past medical history significant for hypertension, anemia, CHF, ESRD on HD who frequently comes to hospital for dialysis need, admitted for fever workup seen as consultation for the management of ESRD. The patient does not go to outpatient dialysis center and usually comes to hospital for HD need.  Her usual center is [facility] where she was about a month ago.  She came for dialysis nightly yesterday when she was found to have temperature 102.2, blood pressure acceptable and in room air.  [name] being admitted for sepsis due to cellulitis versus UTI.  On broad-spectrum antibiotics, culture pending.  The labs showed potassium 3.0, BUN 61, albumin  2.7 calcium  8.7, WBC 15.2 and hemoglobin 10.9. The patient reports generalized body pain but denies nausea, vomiting, chest pain or shortness of breath.  Past Medical History:  Diagnosis Date   Abdominal pain    Acute encephalopathy 11/21/2017   Acute exacerbation of CHF (congestive heart failure) (HCC) 03/03/2022   Acute GI bleeding 03/29/2021   Acute metabolic encephalopathy 02/20/2020   Aggressive behavior    Agitation 11/22/2017   Anoxic brain injury (HCC) 09/08/2016   C. Arrest due to respiratory failure and COPD exacerbation   Anxiety    Arthritis    all over (04/10/2016)   Asthma 10/18/2010   Binge eating disorder    Blood loss anemia 04/24/2011   CAP (community acquired pneumonia) 06/22/2015   Cardiac arrest (HCC) 09/08/2016   PEA   Carotid artery stenosis    1-39% bilateral by dopplers 11/2016   Chronic diastolic (congestive) heart failure (HCC)    Chronic pain syndrome 06/18/2012   Chronic post-traumatic stress disorder (PTSD) 05/27/2018   Chronic respiratory failure with hypoxia and hypercapnia (HCC) 06/22/2015    TRILOGY Vent >AVAPA-ES., Vt target 200-400, Max P 30 , PS max 20 , PS min 6-10 , E Max 6, E Min 4, Rate Auto AVAPS Rate 2 (titrate for pt comfort) , bleed O2 at 5l/m continuous flow .    Closed displaced fracture of fifth metacarpal bone 03/21/2018   Cocaine use disorder, severe, in sustained remission (HCC) 12/17/2015   Complication of anesthesia    decreased bp, decreased heart rate   COPD (chronic obstructive pulmonary disease) (HCC) 07/08/2014   Delusional disorder, persecutory type (HCC) 06/26/2021   Depression    Diabetic neuropathy (HCC) 04/24/2011   Difficulty with speech 01/24/2018   Disorder of nervous system    Drug abuse (HCC) 11/21/2017   Dyslipidemia 04/24/2011   E. coli UTI 02/20/2020   Elevated troponin 04/28/2012   Emphysema    Encephalopathy 11/21/2017   Essential hypertension 03/22/2016   Facet arthropathy, lumbar 06/12/2022   Fibula fracture 07/10/2016   Frequent falls 10/11/2017   GERD (gastroesophageal reflux disease)    GI bleed 03/30/2021   Gout 04/11/2017   Heart attack (HCC) 1980s   Hematochezia    History of blood transfusion 1994   couldn't stop bleeding from my period   History of drug abuse in remission (HCC) 11/28/2015   Quit in 2017   Hyperlipidemia 03/31/2021   Hyperlipidemia LDL goal <70    Hypocalcemia    Hypokalemia    Hypomagnesemia    Incontinence    Manic depression (HCC)    Morbid obesity (HCC) 10/18/2010   Obstructive sleep apnea 10/18/2010  On home oxygen  therapy    6L; 24/7 (04/10/2016)   OSA on CPAP    wear mask sometimes (04/10/2016)   Painless rectal bleeding 12/31/2015   Paranoid (HCC)    sometimes; I'm on RX for it (04/10/2016)   Periumbilical abdominal pain    Prolonged Q-T interval on ECG    Rectal bleeding 12/31/2015   Recurrent syncope 07/09/2021   Rhabdomyolysis 06/16/2021   Schizoaffective disorder, bipolar type (HCC) 04/05/2018   Seasonal allergies    Seborrheic keratoses 12/31/2013   Seizures (HCC)     don't know what kind; last one was ~ 1 yr ago (04/10/2016)   Sinus bradycardia    Skin ulcer of sacrum, limited to breakdown of skin (HCC) 03/08/2022   Spondylolisthesis at L3-L4 level 06/12/2022   Stroke (HCC) 1980s   denies residual on 04/10/2016   Thrush 09/19/2013   Type 2 diabetes mellitus (HCC) 10/18/2010    Past Surgical History:  Procedure Laterality Date   A/V FISTULAGRAM N/A 02/01/2023   Procedure: A/V Fistulagram;  Surgeon: Baron Border, MD;  Location: [facility] INVASIVE CV LAB;  Service: Cardiovascular;  Laterality: N/A;   AV FISTULA PLACEMENT Left 12/21/2022   Procedure: INSERTION OF LEFT ARM ARTERIOVENOUS (AV) GORE-TEX GRAFT;  Surgeon: Carlene Che, MD;  Location: [facility] OR;  Service: Vascular;  Laterality: Left;   CESAREAN SECTION  1997   COLONOSCOPY WITH PROPOFOL  N/A 04/01/2021   Procedure: COLONOSCOPY WITH PROPOFOL ;  Surgeon: Alvis Jourdain, MD;  Location: [facility] ENDOSCOPY;  Service: Gastroenterology;  Laterality: N/A;  Rectal bleeding with drop in hemoglobin to 7.2 g/dL   HERNIA REPAIR     IR CHOLANGIOGRAM EXISTING TUBE  07/20/2016   IR PERC CHOLECYSTOSTOMY  05/10/2016   IR RADIOLOGIST EVAL & MGMT  06/08/2016   IR RADIOLOGIST EVAL & MGMT  06/29/2016   IR SINUS/FIST TUBE CHK-NON GI  07/12/2016   RIGHT/LEFT HEART CATH AND CORONARY ANGIOGRAPHY N/A 06/19/2017   Procedure: RIGHT/LEFT HEART CATH AND CORONARY ANGIOGRAPHY;  Surgeon: Mardell Shade, MD;  Location: [facility] INVAS[facility] CV LAB;  Service: Cardiovascular;  Laterality: N/A;   TIBIA IM NAIL INSERTION Right 07/12/2016   Procedure: INTRAMEDULLARY (IM) NAIL RIGHT TIBIA;  Surgeon: Wes Hamman, MD;  Location: [facility] OR;  Service: Orthopedics;  Laterality: Right;   UMBILICAL HERNIA REPAIR  ~ 1963   that's why I don't have a belly button   VAGINAL HYSTERECTOMY      Family History  Problem Relation Age of Onset   Cancer Mother        lung   Depression Mother    Cancer Father        prostate   Depression Sister    Anxiety  disorder Sister    Schizophrenia Sister    Bipolar disorder Sister    Depression Sister    Depression Brother    Heart failure Other        cousin    Social History:  reports that she quit smoking about 7 years ago. Her smoking use included cigarettes. She started smoking about 46 years ago. She has a 58.6 pack-year smoking history. She has been exposed to tobacco smoke. She has never used smokeless tobacco. She reports that she does not currently use drugs after having used the following drugs: Cocaine. She reports that she does not drink alcohol.  Allergies:  Allergies  Allergen Reactions   King Penning ] Other (See Comments)    GI bleeds   Hydrocodone  Shortness Of Breath and Hives  Latuda [Lurasidone Hcl] Anaphylaxis   Magnesium -Containing Compounds Anaphylaxis and Other (See Comments)    Tolerated Ensure   Prednisone  Anaphylaxis, Swelling and Other (See Comments)    Swelling of tongue, lips, throat   Ultram  [Tramadol ] Anaphylaxis, Swelling and Rash   Codeine Nausea And Vomiting and Rash   Desyrel  [Trazodone ] Other (See Comments)    Paranoia    Hydralazine  Other (See Comments)    Muscle spasms   Peanut (Diagnostic) Hives and Other (See Comments)    Raw peanuts only  OK to eat peanut butter.   Peanut-Containing Drug Products Hives    Raw peanuts, but can eat peanut butter   Topamax  [Topiramate ] Other (See Comments)    Paranoia   Sulfa Antibiotics Itching   Tape Rash    Medications: I have reviewed the patient's current medications.   Results for orders placed or performed during the hospital encounter of 06/27/23 (from the past 48 hours)  Blood Culture (routine x 2)     Status: None (Preliminary result)   Collection Time: 06/27/23  3:52 PM   Specimen: BLOOD RIGHT HAND  Result Value Ref Range   Specimen Description BLOOD RIGHT HAND    Special Requests      BOTTLES DRAWN AEROBIC ONLY Blood Culture results may not be optimal due to an inadequate volume of blood  received in culture bottles   Culture      NO GROWTH < 12 HOURS Performed at [facility], [address] [place], [place] [address]    Report Status PENDING   Blood Culture (routine x 2)     Status: None (Preliminary result)   Collection Time: 06/27/23  4:02 PM   Specimen: BLOOD RIGHT ARM  Result Value Ref Range   Specimen Description BLOOD RIGHT ARM    Special Requests      BOTTLES DRAWN AEROBIC ONLY Blood Culture results may not be optimal due to an inadequate volume of blood received in culture bottles   Culture      NO GROWTH < 12 HOURS Performed at [facility], [address], [place], [place] [address]    Report Status PENDING   Resp panel by RT-PCR (RSV, Flu A&B, Covid) Anterior Nasal Swab     Status: None   Collection Time: 06/27/23  4:11 PM   Specimen: Anterior Nasal Swab  Result Value Ref Range   SARS Coronavirus 2 by RT PCR NEGATIVE NEGATIVE   Influenza A by PCR NEGATIVE NEGATIVE   Influenza B by PCR NEGATIVE NEGATIVE    Comment: (NOTE) The Xpert Xpress SARS-CoV-2/FLU/RSV plus assay is intended as an aid in the diagnosis of influenza from Nasopharyngeal swab specimens and should not be used as a sole basis for treatment. Nasal washings and aspirates are unacceptable for Xpert Xpress SARS-CoV-2/FLU/RSV testing.  Fact Sheet for Patients: BloggerCourse.com  Fact Sheet for Healthcare Providers: SeriousBroker.it  This test is not yet approved or cleared by the [place]  FDA and has been authorized for detection and/or diagnosis of SARS-CoV-2 by FDA under an Emergency Use Authorization (EUA). This EUA will remain in effect (meaning this test can be used) for the duration of the COVID-19 declaration under Section 564(b)(1) of the Act, 21 U.S.C. section 360bbb-3(b)(1), unless the authorization is terminated or revoked.     Resp Syncytial Virus by PCR NEGATIVE NEGATIVE    Comment: (NOTE) Fact Sheet  for Patients: BloggerCourse.com  Fact Sheet for Healthcare Providers: SeriousBroker.it  This test is not yet approved or  cleared by the [place]  FDA and has been authorized for detection and/or diagnosis of SARS-CoV-2 by FDA under an Emergency Use Authorization (EUA). This EUA will remain in effect (meaning this test can be used) for the duration of the COVID-19 declaration under Section 564(b)(1) of the Act, 21 U.S.C. section 360bbb-3(b)(1), unless the authorization is terminated or revoked.  Performed at [facility], [address] [place], [place] [address]   I-Stat Lactic Acid, ED     Status: None   Collection Time: 06/27/23  4:24 PM  Result Value Ref Range   Lactic Acid, Venous 1.1 0.5 - 1.9 mmol/L  Comprehensive metabolic panel     Status: Abnormal   Collection Time: 06/27/23  4:29 PM  Result Value Ref Range   Sodium 130 (L) 135 - 145 mmol/L   Potassium 3.1 (L) 3.5 - 5.1 mmol/L   Chloride 95 (L) 98 - 111 mmol/L   CO2 18 (L) 22 - 32 mmol/L   Glucose, Bld 87 70 - 99 mg/dL    Comment: Glucose reference range applies only to samples taken after fasting for at least 8 hours.   BUN 62 (H) 8 - 23 mg/dL   Creatinine, Ser 6.04 (H) 0.44 - 1.00 mg/dL   Calcium  9.1 8.9 - 10.3 mg/dL   Total Protein 7.0 6.5 - 8.1 g/dL   Albumin  3.4 (L) 3.5 - 5.0 g/dL   AST 16 15 - 41 U/L   ALT 11 0 - 44 U/L   Alkaline Phosphatase 72 38 - 126 U/L   Total Bilirubin 0.6 0.0 - 1.2 mg/dL   GFR, Estimated 7 (L) >60 mL/min    Comment: (NOTE) Calculated using the CKD-EPI Creatinine Equation (2021)    Anion gap 17 (H) 5 - 15    Comment: Performed at [facility] Lab, [address] [place], [place] [address]  CBC with Differential     Status: Abnormal   Collection Time: 06/27/23  4:29 PM  Result Value Ref Range   WBC 19.6 (H) 4.0 - 10.5 K/uL   RBC 3.96 3.87 - 5.11 MIL/uL   Hemoglobin 11.8 (L) 12.0 - 15.0 g/dL   HCT 11.9 14.7 - 82.9 %    MCV 95.5 80.0 - 100.0 fL   MCH 29.8 26.0 - 34.0 pg   MCHC 31.2 30.0 - 36.0 g/dL   RDW 56.2 13.0 - 86.5 %   Platelets 258 150 - 400 K/uL   nRBC 0.0 0.0 - 0.2 %   Neutrophils Relative % 93 %   Neutro Abs 18.0 (H) 1.7 - 7.7 K/uL   Lymphocytes Relative 3 %   Lymphs Abs 0.7 0.7 - 4.0 K/uL   Monocytes Relative 3 %   Monocytes Absolute 0.6 0.1 - 1.0 K/uL   Eosinophils Relative 0 %   Eosinophils Absolute 0.0 0.0 - 0.5 K/uL   Basophils Relative 0 %   Basophils Absolute 0.0 0.0 - 0.1 K/uL   Immature Granulocytes 1 %   Abs Immature Granulocytes 0.20 (H) 0.00 - 0.07 K/uL    Comment: Performed at [facility] Lab, [address], [place], [place] [address]  Protime-INR     Status: None   Collection Time: 06/27/23  4:29 PM  Result Value Ref Range   Prothrombin Time 14.1 11.4 - 15.2 seconds   INR 1.1 0.8 - 1.2    Comment: (NOTE) INR goal varies based on device and disease states. Performed at [facility], [address]  [place], [place] [address]   Troponin I (High Sensitivity)     Status: Abnormal   Collection Time: 06/27/23  4:29 PM  Result Value Ref Range   Troponin I (High Sensitivity) 19 (H) <18 ng/L    Comment: (NOTE) Elevated high sensitivity troponin I (hsTnI) values and significant  changes across serial measurements may suggest ACS but many other  chronic and acute conditions are known to elevate hsTnI results.  Refer to the Links section for chest pain algorithms and additional  guidance. Performed at [facility], [address] [place], [place] [address]   Urinalysis, w/ Reflex to Culture (Infection Suspected) -Urine, Clean Catch     Status: Abnormal   Collection Time: 06/27/23  5:51 PM  Result Value Ref Range   Specimen Source URINE, CLEAN CATCH    Color, Urine YELLOW YELLOW   APPearance HAZY (A) CLEAR   Specific Gravity, Urine 1.009 1.005 - 1.030   pH 5.0 5.0 - 8.0   Glucose, UA NEGATIVE NEGATIVE mg/dL   Hgb urine dipstick SMALL (A) NEGATIVE    Bilirubin Urine NEGATIVE NEGATIVE   Ketones, ur NEGATIVE NEGATIVE mg/dL   Protein, ur 30 (A) NEGATIVE mg/dL   Nitrite NEGATIVE NEGATIVE   Leukocytes,Ua MODERATE (A) NEGATIVE   RBC / HPF 0-5 0 - 5 RBC/hpf   WBC, UA 11-20 0 - 5 WBC/hpf    Comment:        Reflex urine culture not performed if WBC <=10, OR if Squamous epithelial cells >5. If Squamous epithelial cells >5 suggest recollection.    Bacteria, UA MANY (A) NONE SEEN   Squamous Epithelial / HPF 0-5 0 - 5 /HPF   Mucus PRESENT     Comment: Performed at [facility], [address], [place], [place] [address]  Troponin I (High Sensitivity)     Status: Abnormal   Collection Time: 06/27/23  6:19 PM  Result Value Ref Range   Troponin I (High Sensitivity) 21 (H) <18 ng/L    Comment: (NOTE) Elevated high sensitivity troponin I (hsTnI) values and significant  changes across serial measurements may suggest ACS but many other  chronic and acute conditions are known to elevate hsTnI results.  Refer to the Links section for chest pain algorithms and additional  guidance. Performed at [facility], [address] [place], [place] [address]   HIV Antibody (routine testing w rflx)     Status: None   Collection Time: 06/27/23  9:03 PM  Result Value Ref Range   HIV Screen 4th Generation wRfx Non Reactive Non Reactive    Comment: Performed at [facility], [address], [place], [place] [address]  CBC     Status: Abnormal   Collection Time: 06/27/23  9:03 PM  Result Value Ref Range   WBC 21.2 (H) 4.0 - 10.5 K/uL   RBC 3.26 (L) 3.87 - 5.11 MIL/uL   Hemoglobin 9.7 (L) 12.0 - 15.0 g/dL   HCT 16.0 (L) 73.7 - 10.6 %   MCV 94.5 80.0 - 100.0 fL   MCH 29.8 26.0 - 34.0 pg   MCHC 31.5 30.0 - 36.0 g/dL   RDW 26.9 48.5 - 46.2 %   Platelets 257 150 - 400 K/uL   nRBC 0.0 0.0 - 0.2 %    Comment: Performed at [facility] Lab, [address], [place], [place] [address]  Creatinine, serum     Status: Abnormal   Collection  Time: 06/27/23  9:03  PM  Result Value Ref Range   Creatinine, Ser 6.06 (H) 0.44 - 1.00 mg/dL   GFR, Estimated 7 (L) >60 mL/min    Comment: (NOTE) Calculated using the CKD-EPI Creatinine Equation (2021) Performed at [facility], [address], [place], [place] [address]   Protime-INR     Status: Abnormal   Collection Time: 06/28/23  7:18 AM  Result Value Ref Range   Prothrombin Time 15.5 (H) 11.4 - 15.2 seconds   INR 1.2 0.8 - 1.2    Comment: (NOTE) INR goal varies based on device and disease states. Performed at [facility] Lab, [address], [place], [place] [address]   Cortisol-am, blood     Status: None   Collection Time: 06/28/23  7:18 AM  Result Value Ref Range   Cortisol - AM 9.2 6.7 - 22.6 ug/dL    Comment: Performed at [facility], [address], [place], [place] [address]  Comprehensive metabolic panel     Status: Abnormal   Collection Time: 06/28/23  7:18 AM  Result Value Ref Range   Sodium 134 (L) 135 - 145 mmol/L   Potassium 3.0 (L) 3.5 - 5.1 mmol/L   Chloride 99 98 - 111 mmol/L   CO2 19 (L) 22 - 32 mmol/L   Glucose, Bld 78 70 - 99 mg/dL    Comment: Glucose reference range applies only to samples taken after fasting for at least 8 hours.   BUN 61 (H) 8 - 23 mg/dL   Creatinine, Ser 2.84 (H) 0.44 - 1.00 mg/dL   Calcium  8.7 (L) 8.9 - 10.3 mg/dL   Total Protein 6.0 (L) 6.5 - 8.1 g/dL   Albumin  2.7 (L) 3.5 - 5.0 g/dL   AST 13 (L) 15 - 41 U/L   ALT 9 0 - 44 U/L   Alkaline Phosphatase 78 38 - 126 U/L   Total Bilirubin 0.7 0.0 - 1.2 mg/dL   GFR, Estimated 7 (L) >60 mL/min    Comment: (NOTE) Calculated using the CKD-EPI Creatinine Equation (2021)    Anion gap 16 (H) 5 - 15    Comment: Performed at [facility], [address], [place], [place] [address]  CBC     Status: Abnormal   Collection Time: 06/28/23  7:18 AM  Result Value Ref Range   WBC 15.2 (H) 4.0 - 10.5 K/uL   RBC 3.63 (L) 3.87 - 5.11 MIL/uL   Hemoglobin 10.9 (L) 12.0 -  15.0 g/dL   HCT 01.0 (L) 27.2 - 53.6 %   MCV 96.1 80.0 - 100.0 fL   MCH 30.0 26.0 - 34.0 pg   MCHC 31.2 30.0 - 36.0 g/dL   RDW 64.4 03.4 - 74.2 %   Platelets 245 150 - 400 K/uL   nRBC 0.0 0.0 - 0.2 %    Comment: Performed at [facility], [address], [place], [place] [address]  CBC     Status: Abnormal   Collection Time: 06/28/23  9:02 AM  Result Value Ref Range   WBC 13.8 (H) 4.0 - 10.5 K/uL   RBC 3.11 (L) 3.87 - 5.11 MIL/uL   Hemoglobin 9.4 (L) 12.0 - 15.0 g/dL   HCT 87.5 (L) 64.3 - 32.9 %   MCV 95.2 80.0 - 100.0 fL   MCH 30.2 26.0 - 34.0 pg   MCHC 31.8 30.0 - 36.0 g/dL   RDW 51.8 84.1 - 66.0 %   Platelets 237 150 - 400 K/uL  nRBC 0.0 0.0 - 0.2 %    Comment: Performed at [facility], [address], [place], [place] [address]   *Note: Due to a large number of results and/or encounters for the requested time period, some results have not been displayed. A complete set of results can be found in Results Review.    DG Chest Port 1 View Result Date: 06/27/2023 CLINICAL DATA:  Chest pain and shortness of breath. EXAM: PORTABLE CHEST 1 VIEW COMPARISON:  June 21, 2023. FINDINGS: Stable cardiomegaly. Right internal jugular dialysis catheter is unchanged. Both lungs are clear. Old left rib fractures. IMPRESSION: No active disease. Electronically Signed   By: Rosalene Colon M.D.   On: 06/27/2023 15:30    ROS: As per H&P, rest of the systems reviewed and negative. Blood pressure 129/66, pulse 70, temperature 98 F (36.7 C), resp. rate 13, height 5' 2 (1.575 m), weight 86.3 kg, SpO2 99%. Gen: NAD, comfortable Respiratory: Clear bilateral, no wheezing or crackle Cardiovascular: Regular rate rhythm S1-S2 normal, no rubs GI: Abdomen soft, nontender, nondistended Extremities, no cyanosis or clubbing, no edema Skin: No rash or ulcer Neurology: Alert, awake, following commands, oriented Dialysis Access: Scnetx  Assessment/Plan:  # Acute febrile illness thought to be due  to UTI but cellulitis.  Currently on antibiotics per primary team.  # ESRD: Does not go to outpatient unit and comes to hospital for HD.  We will plan for dialysis today.  Informed around navigator.  [facility] for the access.  # Hypertension: Resume home medication, UF with HD.  # Anemia of ESRD: Hemoglobin around goal.  Dose ESA weekly.  # Metabolic Bone Disease: Check phosphorus level, monitor lab.  Thank you for the consult, we will continue to follow.  Alen Matheson [place] Planas 06/28/2023, 11:43 AM

## 2023-06-28 NOTE — Plan of Care (Signed)
  Problem: Fluid Volume: Goal: Hemodynamic stability will improve Outcome: Progressing   Problem: Clinical Measurements: Goal: Diagnostic test results will improve Outcome: Progressing Goal: Signs and symptoms of infection will decrease Outcome: Progressing   Problem: Respiratory: Goal: Ability to maintain adequate ventilation will improve Outcome: Progressing   Problem: Fluid Volume: Goal: Hemodynamic stability will improve Outcome: Progressing   Problem: Clinical Measurements: Goal: Diagnostic test results will improve Outcome: Progressing Goal: Signs and symptoms of infection will decrease Outcome: Progressing   Problem: Respiratory: Goal: Ability to maintain adequate ventilation will improve Outcome: Progressing   Problem: Education: Goal: Knowledge of General Education information will improve Description: Including pain rating scale, medication(s)/side effects and non-pharmacologic comfort measures Outcome: Progressing   Problem: Health Behavior/Discharge Planning: Goal: Ability to manage health-related needs will improve Outcome: Progressing   Problem: Clinical Measurements: Goal: Ability to maintain clinical measurements within normal limits will improve Outcome: Progressing Goal: Will remain free from infection Outcome: Progressing Goal: Diagnostic test results will improve Outcome: Progressing Goal: Respiratory complications will improve Outcome: Progressing Goal: Cardiovascular complication will be avoided Outcome: Progressing   Problem: Activity: Goal: Risk for activity intolerance will decrease Outcome: Progressing   Problem: Coping: Goal: Level of anxiety will decrease Outcome: Progressing   Problem: Elimination: Goal: Will not experience complications related to bowel motility Outcome: Progressing Goal: Will not experience complications related to urinary retention Outcome: Progressing   Problem: Pain Managment: Goal: General experience  of comfort will improve and/or be controlled Outcome: Progressing

## 2023-06-28 NOTE — Progress Notes (Signed)
 Contacted by nephrologist regarding pt's admission. Pt known to navigator. Pt's HD clinic is supposed to be [facility] but pt has refused to resume care there. Pt's last HD treatment at clinic was May 14. Clinic has been communicating with pt via phone in an attempt to get pt transferred to another HD clinic that pt would be agreeable to. Clinic has made referrals to other clinics but none of those have worked out as an option. Contacted local [facility] staff to inquire if there are any options for pt here in the [place] area. [facility] staff to investigate options but did ask that navigator contact a few clinics directly to inquire as well. Navigator contacted clinics recommended by [employer] and awaiting responses. This info was provided to nephrologist, HD coordinator (for informative purposes only), and RN CM. Pt can resume at [facility] on TTS at d/c if pt is agreeable but up until now, pt has not been agreeable to this plan. Will assist as needed.   Lauraine Polite Renal Navigator [phone]

## 2023-06-28 NOTE — Progress Notes (Signed)
 Progress Note   Patient: Tiffany [name] [id number] DOB: 09/30/1960 DOA: 06/27/2023     1 DOS: the patient was seen and examined on 06/28/2023   Brief hospital course: Per H&P HPI  Tiffany [name] is a 63 y.o. female with medical history significant of end-stage renal disease on hemodialysis who usually comes to the ER for her hemodialysis, chronic diastolic heart failure, schizoaffective disorder, anemia of chronic disease, morbid obesity, obstructive sleep apnea, GERD, tobacco abuse, history of seizure disorder who presented to the ER again with complaint of leg swelling.  Patient last dialysis was about 5 to 6 days ago.  The legs are red and she has had some fever.  Also some cough which is chronic.  Patient had recent runny nose but no nausea vomiting or diarrhea.  [name] not hypoxic.  [name] felt some pain in the legs that are swollen.  Patient was seen and evaluated.  Appears to have met sepsis criteria most likely cellulitis.  Versus UTI.  Patient complained of dysuria.   Assessment and Plan: Sepsis due to cellulitis and UTI   Had fever and leukocytosis and infection source. Could have line infection as well.   Continue cefepime  and vancomycin .  F/u Bcx, UCx  ESRD on HD MWF  States last HD this past Friday.  HD this AM   GERD  Noted continue home PPI  Schizoaffective d/o  Continue home regimen  OSA  No CPAP at home  Continue to monitor   H/o anoxic brain injury  Stable.   Class I obesity  BMI 34  Noted       Subjective: Cold and tired. Fever up to 102F. Cough yesterday with clear sputum. Has h/o IBS and noted frequent stools. Patient gets HD through tunneled dialysis catheter.  Physical Exam: Vitals:   06/27/23 1915 06/27/23 2100 06/27/23 2304 06/28/23 0721  BP: (!) 130/92 131/70 136/68 (!) 153/85  Pulse: 78 81 75 75  Resp: (!) 24 20 20 18   Temp:  98.9 F (37.2 C) 99.4 F (37.4 C) 99.5 F (37.5 C)  TempSrc:  Oral Oral Oral  SpO2: 95% 92% 90% 99%   Weight:      Height:       Physical Exam  Constitutional: In no distress.  Tunneled dialysis cather in R chest, no purluent drainage, no erytehma of overlying skin  Cardiovascular: Normal rate, regular rhythm. Pedal edemal Pulmonary: Non labored breathing on room air, no wheezing or rales.   Abdominal: Soft. Normal bowel sounds. Non distended and non tender Musculoskeletal: Normal range of motion.     Neurological: Alert and oriented to person, place, and time. Non focal  Skin: Skin is warm and dry. Has blotchy erytehmatous areas on BLE  Data Reviewed:     Latest Ref Rng & Units 06/28/2023    9:02 AM 06/28/2023    7:18 AM 06/27/2023    9:03 PM  CBC  WBC 4.0 - 10.5 K/uL 13.8  15.2  21.2   Hemoglobin 12.0 - 15.0 g/dL 9.4  27.2  9.7   Hematocrit 36.0 - 46.0 % 29.6  34.9  30.8   Platelets 150 - 400 K/uL 237  245  257       Latest Ref Rng & Units 06/28/2023    7:18 AM 06/27/2023    9:03 PM 06/27/2023    4:29 PM  BMP  Glucose 70 - 99 mg/dL 78   87   BUN 8 - 23 mg/dL 61  62   Creatinine 0.44 - 1.00 mg/dL 9.51  8.84  1.66   Sodium 135 - 145 mmol/L 134   130   Potassium 3.5 - 5.1 mmol/L 3.0   3.1   Chloride 98 - 111 mmol/L 99   95   CO2 22 - 32 mmol/L 19   18   Calcium  8.9 - 10.3 mg/dL 8.7   9.1      Family Communication: NO family at bedside.   Disposition: Status is: Inpatient Remains inpatient appropriate because: IV antibiotics   Planned Discharge Destination: Pending clinical course     Time spent: 35 minutes  Author: Joette Mustard, MD 06/28/2023 9:08 AM  For on call review [email or web address].

## 2023-06-29 DIAGNOSIS — A419 Sepsis, unspecified organism: Secondary | ICD-10-CM | POA: Diagnosis not present

## 2023-06-29 LAB — CBC WITH DIFFERENTIAL/PLATELET
Abs Immature Granulocytes: 0.01 10*3/uL (ref 0.00–0.07)
Basophils Absolute: 0.1 10*3/uL (ref 0.0–0.1)
Basophils Relative: 1 %
Eosinophils Absolute: 0.2 10*3/uL (ref 0.0–0.5)
Eosinophils Relative: 2 %
HCT: 31 % — ABNORMAL LOW (ref 36.0–46.0)
Hemoglobin: 9.7 g/dL — ABNORMAL LOW (ref 12.0–15.0)
Immature Granulocytes: 0 %
Lymphocytes Relative: 13 %
Lymphs Abs: 0.9 10*3/uL (ref 0.7–4.0)
MCH: 30.1 pg (ref 26.0–34.0)
MCHC: 31.3 g/dL (ref 30.0–36.0)
MCV: 96.3 fL (ref 80.0–100.0)
Monocytes Absolute: 0.9 10*3/uL (ref 0.1–1.0)
Monocytes Relative: 13 %
Neutro Abs: 4.9 10*3/uL (ref 1.7–7.7)
Neutrophils Relative %: 71 %
Platelets: 218 10*3/uL (ref 150–400)
RBC: 3.22 MIL/uL — ABNORMAL LOW (ref 3.87–5.11)
RDW: 14.6 % (ref 11.5–15.5)
WBC: 6.9 10*3/uL (ref 4.0–10.5)
nRBC: 0 % (ref 0.0–0.2)

## 2023-06-29 LAB — HEPATITIS B SURFACE ANTIBODY, QUANTITATIVE: Hep B S AB Quant (Post): 2915 m[IU]/mL

## 2023-06-29 MED ORDER — LAMOTRIGINE 100 MG PO TABS
100.0000 mg | ORAL_TABLET | Freq: Every day | ORAL | Status: DC
  Administered 2023-06-29 – 2023-07-06 (×8): 100 mg via ORAL
  Filled 2023-06-29 (×8): qty 1

## 2023-06-29 MED ORDER — DICYCLOMINE HCL 10 MG PO CAPS
10.0000 mg | ORAL_CAPSULE | Freq: Three times a day (TID) | ORAL | Status: DC
  Administered 2023-06-29 – 2023-07-07 (×21): 10 mg via ORAL
  Filled 2023-06-29 (×23): qty 1

## 2023-06-29 MED ORDER — PANTOPRAZOLE SODIUM 40 MG PO TBEC
40.0000 mg | DELAYED_RELEASE_TABLET | Freq: Every day | ORAL | Status: DC
  Administered 2023-06-29 – 2023-07-07 (×9): 40 mg via ORAL
  Filled 2023-06-29 (×9): qty 1

## 2023-06-29 MED ORDER — ESCITALOPRAM OXALATE 10 MG PO TABS
10.0000 mg | ORAL_TABLET | Freq: Every day | ORAL | Status: DC
  Administered 2023-06-29 – 2023-07-07 (×9): 10 mg via ORAL
  Filled 2023-06-29 (×9): qty 1

## 2023-06-29 MED ORDER — LOPERAMIDE HCL 2 MG PO CAPS
2.0000 mg | ORAL_CAPSULE | Freq: Three times a day (TID) | ORAL | Status: DC | PRN
  Administered 2023-06-29: 2 mg via ORAL
  Filled 2023-06-29: qty 1

## 2023-06-29 MED ORDER — ATORVASTATIN CALCIUM 10 MG PO TABS
20.0000 mg | ORAL_TABLET | Freq: Every day | ORAL | Status: DC
  Administered 2023-06-29 – 2023-07-07 (×9): 20 mg via ORAL
  Filled 2023-06-29 (×9): qty 2

## 2023-06-29 MED ORDER — FERROUS SULFATE 325 (65 FE) MG PO TABS
325.0000 mg | ORAL_TABLET | ORAL | Status: DC
  Administered 2023-06-29: 325 mg via ORAL
  Filled 2023-06-29: qty 1

## 2023-06-29 MED ORDER — QUETIAPINE FUMARATE 50 MG PO TABS
50.0000 mg | ORAL_TABLET | Freq: Two times a day (BID) | ORAL | Status: DC
  Administered 2023-06-29 – 2023-07-06 (×15): 50 mg via ORAL
  Filled 2023-06-29 (×16): qty 1

## 2023-06-29 MED ORDER — MIRTAZAPINE 15 MG PO TABS
15.0000 mg | ORAL_TABLET | Freq: Every day | ORAL | Status: DC
  Administered 2023-06-29 – 2023-07-06 (×8): 15 mg via ORAL
  Filled 2023-06-29 (×8): qty 1

## 2023-06-29 MED ORDER — CARVEDILOL 12.5 MG PO TABS
12.5000 mg | ORAL_TABLET | Freq: Two times a day (BID) | ORAL | Status: DC
  Administered 2023-06-29 – 2023-07-06 (×14): 12.5 mg via ORAL
  Filled 2023-06-29 (×15): qty 1

## 2023-06-29 MED ORDER — HYDROXYZINE HCL 10 MG PO TABS
10.0000 mg | ORAL_TABLET | Freq: Three times a day (TID) | ORAL | Status: DC | PRN
  Administered 2023-07-01 – 2023-07-02 (×4): 10 mg via ORAL
  Filled 2023-06-29 (×5): qty 1

## 2023-06-29 MED ORDER — FOLIC ACID 1 MG PO TABS
1.0000 mg | ORAL_TABLET | Freq: Every day | ORAL | Status: DC
  Administered 2023-06-29 – 2023-07-07 (×9): 1 mg via ORAL
  Filled 2023-06-29 (×9): qty 1

## 2023-06-29 MED ORDER — DULOXETINE HCL 30 MG PO CPEP
30.0000 mg | ORAL_CAPSULE | Freq: Every day | ORAL | Status: DC
  Administered 2023-06-29 – 2023-07-07 (×9): 30 mg via ORAL
  Filled 2023-06-29 (×9): qty 1

## 2023-06-29 MED ORDER — AMLODIPINE BESYLATE 10 MG PO TABS
10.0000 mg | ORAL_TABLET | Freq: Every day | ORAL | Status: DC
  Administered 2023-06-29 – 2023-07-07 (×7): 10 mg via ORAL
  Filled 2023-06-29 (×7): qty 1

## 2023-06-29 MED ORDER — BUDESON-GLYCOPYRROL-FORMOTEROL 160-9-4.8 MCG/ACT IN AERO
2.0000 | INHALATION_SPRAY | Freq: Two times a day (BID) | RESPIRATORY_TRACT | Status: DC
  Administered 2023-06-29 – 2023-07-07 (×15): 2 via RESPIRATORY_TRACT
  Filled 2023-06-29: qty 5.9

## 2023-06-29 NOTE — Progress Notes (Signed)
 [facility] Progress Note   Subjective:   Afebrile and leukocytosis resolved. Reports dysuria x 1 month.  HD went fine.  Says she will not return to [place] for dialysis at any point.   Objective Vitals:   06/28/23 2035 06/29/23 0432 06/29/23 0833 06/29/23 1111  BP: 112/79 (!) 143/71 123/65   Pulse: 70 74 74   Resp: 17 14 18    Temp: 98 F (36.7 C) 98.3 F (36.8 C) 98.1 F (36.7 C)   TempSrc: Oral Oral    SpO2: 97% 100% 100% 95%  Weight:      Height:       Physical Exam General: comfortable in bed Heart:RRR Lungs:clear on RA Abdomen: soft Extremities: no edema Dialysis Access: [facility] c/d/i  Additional Objective Labs: Basic Metabolic Panel: Recent Labs  Lab 06/27/23 1629 06/27/23 2103 06/28/23 0717 06/28/23 0718  NA 130*  --   --  134*  K 3.1*  --   --  3.0*  CL 95*  --   --  99  CO2 18*  --   --  19*  GLUCOSE 87  --   --  78  BUN 62*  --   --  61*  CREATININE 6.09* 6.06*  --  5.91*  CALCIUM  9.1  --   --  8.7*  PHOS  --   --  5.5*  --    Liver Function Tests: Recent Labs  Lab 06/27/23 1629 06/28/23 0718  AST 16 13*  ALT 11 9  ALKPHOS 72 78  BILITOT 0.6 0.7  PROT 7.0 6.0*  ALBUMIN  3.4* 2.7*   No results for input(s): LIPASE, AMYLASE in the last 168 hours. CBC: Recent Labs  Lab 06/27/23 1629 06/27/23 2103 06/28/23 0718 06/28/23 0902 06/29/23 0633  WBC 19.6* 21.2* 15.2* 13.8* 6.9  NEUTROABS 18.0*  --   --   --  4.9  HGB 11.8* 9.7* 10.9* 9.4* 9.7*  HCT 37.8 30.8* 34.9* 29.6* 31.0*  MCV 95.5 94.5 96.1 95.2 96.3  PLT 258 257 245 237 218   Blood Culture    Component Value Date/Time   SDES URINE, RANDOM 06/27/2023 1751   SPECREQUEST  06/27/2023 1751    NONE Reflexed from [id number] Performed at [facility], [address] [place], [place] [address]    CULT >=100,000 COLONIES/mL ESCHERICHIA COLI (A) 06/27/2023 1751   REPTSTATUS 06/29/2023 FINAL 06/27/2023 1751    Cardiac Enzymes: No results for input(s): CKTOTAL,  CKMB, CKMBINDEX, TROPONINI in the last 168 hours. CBG: No results for input(s): GLUCAP in the last 168 hours. Iron  Studies: No results for input(s): IRON , TIBC, TRANSFERRIN, FERRITIN in the last 72 hours. @lablastinr3 @ Studies/Results: DG Chest Port 1 View Result Date: 06/27/2023 CLINICAL DATA:  Chest pain and shortness of breath. EXAM: PORTABLE CHEST 1 VIEW COMPARISON:  June 21, 2023. FINDINGS: Stable cardiomegaly. Right internal jugular dialysis catheter is unchanged. Both lungs are clear. Old left rib fractures. IMPRESSION: No active disease. Electronically Signed   By: Rosalene Colon M.D.   On: 06/27/2023 15:30   Medications:   ceFAZolin  (ANCEF ) IV Stopped (06/29/23 0037)    amLODipine   10 mg Oral Daily   atorvastatin   20 mg Oral Daily   budesonide -glycopyrrolate -formoterol   2 puff Inhalation BID   carvedilol   12.5 mg Oral BID   Chlorhexidine  Gluconate Cloth  6 each Topical Daily   Chlorhexidine  Gluconate Cloth  6 each Topical Q0600   DULoxetine   30 mg Oral Daily   escitalopram   10 mg Oral Daily   ferrous sulfate   325 mg Oral QODAY   folic acid   1 mg Oral Daily   heparin   5,000 Units Subcutaneous Q8H   lamoTRIgine   100 mg Oral Daily   metolazone   2.5 mg Oral Q M,W,F   mirtazapine   15 mg Oral QHS   pantoprazole   40 mg Oral Daily   QUEtiapine   50 mg Oral BID   sodium chloride  flush  10-40 mL Intracatheter Q12H    Assessment/Plan:   # UTI: presented with fever, urine culture + E coli.  Currently on antibiotics per primary team.   # ESRD: Assigned to [facility] but declines to go there.  Tol HD fine here yesterday.  We will plan for dialysis tomorrow.  Informed renal navigator who is looking into other HD unit for patient.  [facility] for access, working well.    # Hypertension: Resumed home medication, UF with HD.  Normotensive this AM   # Anemia of ESRD: Hemoglobin around goal.  Dose ESA weekly.   # Metabolic Bone Disease: Ca and phos ok. Does not appear to be on a  binder.   Thank you for the consult, we will continue to follow.  Needs to remain admitted at this time while we look for another HD unit for her.   Adrian Alba MD 06/29/2023, 11:37 AM  [facility] Pager: [phone]

## 2023-06-29 NOTE — Progress Notes (Addendum)
 Contacted [facility] staff to inquire further regarding possible clinic options for pt at d/c. Multiple clinics are at capacity and unable to accept pt. Pt may have to resume at assigned clinic and continue to work on transfer from there. Pt has declined transfer request/referrals being sent to other providers outside of the [place] area in the past. Will assist as needed.   Lauraine Polite Renal Navigator [phone]  Addendum at 12:35 pm: Advised by [facility] staff that pt will need to resume at [facility] due to no other appropriate clinic having availability for pt at this time. [facility] is willing to make a transfer request for pt from [place] once something becomes available. Met with pt at bedside. Made pt aware of the above info including TTS 10:25 am chair time at [facility]. Pt is adamant that she will not return to [facility] for any reason. Discussed options for out-pt HD at [facility] clinics in [place] or [facility] clinics in [place]. Pt unsure if transportation provided through insurance will take pt to [facility] but feels confident that transportation will take her [employer]. Pt agreeable to referral being made to [facility] for a clinic in [facility]. Update provided to nephrologist and RN CM. [facility] [place] made aware of pt's response as well. Contacted Kim with [facility] out-pt HD and left a message regarding needing to make a referral. Awaiting a return call.   Addendum at 3:24 pm: Spoke to Burdette Carolin at [facility] regarding pt's referral. Clinicals faxed to Burdette Carolin this afternoon for review. Will not receive any updates or determination over the weekend.

## 2023-06-29 NOTE — TOC CM/SW Note (Addendum)
 Transition of Care [facility]) - Inpatient Brief Assessment   Patient Details  Name: Tiffany [name] MRN: [id number] Date of Birth: Jun 30, 1960  Transition of Care [facility]) CM/SW Contact:    Tom-Johnson, Vittoria Noreen Daphne, RN Phone Number: 06/29/2023, 3:14 PM   Clinical Narrative:  Patient presented to the ED with Bilat LE Swelling, Cough, Fever and Dysuria. Admitted with Sepsis, on IV abx. Patient has hx of GERD, OSA, Anemia, Schizophrenia, Chronic Diastolic HF, ESRD, does not have outpatient HD clinic, comes to the ED for HD. Renal Navigator working on finding patient an outpatient HD Clinic.  From home alone, has three children who lives out of state. Patient states her mother assists with her care. Patient is Wheelchair bound, has motorized w/c, manual w/c walkers and shower seat at home.  PCP is Danella Dunn, MD and uses [facility] on [address]   Patient not Medically ready for discharge.  CM will continue to follow as patient progresses with care towards discharge.        Transition of Care Asessment: Insurance and Status: Insurance coverage has been reviewed Patient has primary care physician: Yes Home environment has been reviewed: Yes Prior level of function:: Wheelchiar bound Prior/Current Home Services: No current home services Social Drivers of Health Review: SDOH reviewed no interventions necessary Readmission risk has been reviewed: Yes Transition of care needs: transition of care needs identified, TOC will continue to follow

## 2023-06-29 NOTE — Care Management Important Message (Signed)
 Important Message  Patient Details  Name: Tiffany [name] MRN: [id number] Date of Birth: Mar 06, 1960   Important Message Given:  Yes - Medicare IM     Wynonia Hedges 06/29/2023, 3:27 PM

## 2023-06-29 NOTE — Progress Notes (Addendum)
 Progress Note   Patient: Tiffany [name] [id number]:[id number] DOB: Dec 09, 1960 DOA: 06/27/2023     2 DOS: the patient was seen and examined on 06/29/2023   Brief hospital course: Per H&P HPI  Tiffany [name] is a 63 y.o. female with medical history significant of end-stage renal disease on hemodialysis who usually comes to the ER for her hemodialysis, chronic diastolic heart failure, schizoaffective disorder, anemia of chronic disease, morbid obesity, obstructive sleep apnea, GERD, tobacco abuse, history of seizure disorder who presented to the ER again with complaint of leg swelling.  Patient last dialysis was about 5 to 6 days ago.  The legs are red and she has had some fever.  Also some cough which is chronic.  Patient had recent runny nose but no nausea vomiting or diarrhea.  [name] not hypoxic.  [name] felt some pain in the legs that are swollen.  Patient was seen and evaluated.  Appears to have met sepsis criteria most likely cellulitis.  Versus UTI.  Patient complained of dysuria.   Assessment and Plan: Sepsis due to cellulitis and UTI POA Had fever and leukocytosis and infection source. Now afebrile, no leukocytosis Urine with E. Coli - Continue Ancef  - Follow-up blood cultures  ESRD on HD MWF  States last HD this past Friday.  HD per nephrology  Normocytic anemia H/o Fe deficiency     Latest Ref Rng & Units 06/29/2023    6:33 AM 06/28/2023    9:02 AM 06/28/2023    7:18 AM  CBC  WBC 4.0 - 10.5 K/uL 6.9  13.8  15.2   Hemoglobin 12.0 - 15.0 g/dL 9.7  9.4  51.8   Hematocrit 36.0 - 46.0 % 31.0  29.6  34.9   Platelets 150 - 400 K/uL 218  237  245    Stable.  Likely multifactorial.  Anemia of chronic kidney disease. Iron /TIBC/Ferritin/ %Sat    Component Value Date/Time   IRON  47 06/23/2022 1919   IRON  44 06/25/2017 1626   TIBC 386 06/23/2022 1919   TIBC 290 06/25/2017 1626   FERRITIN 35 06/23/2022 1919   IRONPCTSAT 12 06/23/2022 1919   IRONPCTSAT 15 06/25/2017 1626  Check iron   labs, b12, folate  Continue po iron    GERD  Noted.  Continue home PPI  Schizoaffective d/o  Continue home regimen  OSA  No CPAP at home  Continue to monitor   H/o anoxic brain injury  Stable.   Class I obesity  BMI 34  Noted       Subjective: No acute complaints this a.m. Physical Exam: Vitals:   06/29/23 0432 06/29/23 0833 06/29/23 1111 06/29/23 1749  BP: (!) 143/71 123/65  (!) 90/55  Pulse: 74 74  69  Resp: 14 18  18   Temp: 98.3 F (36.8 C) 98.1 F (36.7 C)  98.4 F (36.9 C)  TempSrc: Oral     SpO2: 100% 100% 95% 100%  Weight:      Height:        Constitutional: In no distress.  Chest: Right chest tunneled dialysis catheter, at skin no erythema, no purulent drainage Cardiovascular: Normal rate, regular rhythm. No lower extremity edema  Pulmonary: Non labored breathing on room air, no wheezing or rales.   Abdominal: Soft. Normal bowel sounds. Non distended and non tender Musculoskeletal: Normal range of motion.     Neurological: Alert and oriented to person, place, and time. Non focal  Skin: Skin is warm and dry.   Data Reviewed:  Latest Ref Rng & Units 06/29/2023    6:33 AM 06/28/2023    9:02 AM 06/28/2023    7:18 AM  CBC  WBC 4.0 - 10.5 K/uL 6.9  13.8  15.2   Hemoglobin 12.0 - 15.0 g/dL 9.7  9.4  40.9   Hematocrit 36.0 - 46.0 % 31.0  29.6  34.9   Platelets 150 - 400 K/uL 218  237  245       Latest Ref Rng & Units 06/28/2023    7:18 AM 06/27/2023    9:03 PM 06/27/2023    4:29 PM  BMP  Glucose 70 - 99 mg/dL 78   87   BUN 8 - 23 mg/dL 61   62   Creatinine 8.11 - 1.00 mg/dL 9.14  7.82  9.56   Sodium 135 - 145 mmol/L 134   130   Potassium 3.5 - 5.1 mmol/L 3.0   3.1   Chloride 98 - 111 mmol/L 99   95   CO2 22 - 32 mmol/L 19   18   Calcium  8.9 - 10.3 mg/dL 8.7   9.1      Family Communication: No family at bedside. Lives with elderly mother.   Disposition: Status is: Inpatient Remains inpatient appropriate because: IV antibiotics   Planned  Discharge Destination: Pending clinical course     Time spent: 35 minutes  Author: Joette Mustard, MD 06/29/2023 7:13 PM  For on call review [email or web address].

## 2023-06-30 DIAGNOSIS — A419 Sepsis, unspecified organism: Secondary | ICD-10-CM | POA: Diagnosis not present

## 2023-06-30 LAB — RENAL FUNCTION PANEL
Albumin: 2.4 g/dL — ABNORMAL LOW (ref 3.5–5.0)
Anion gap: 12 (ref 5–15)
BUN: 42 mg/dL — ABNORMAL HIGH (ref 8–23)
CO2: 25 mmol/L (ref 22–32)
Calcium: 8.7 mg/dL — ABNORMAL LOW (ref 8.9–10.3)
Chloride: 98 mmol/L (ref 98–111)
Creatinine, Ser: 4.5 mg/dL — ABNORMAL HIGH (ref 0.44–1.00)
GFR, Estimated: 10 mL/min — ABNORMAL LOW (ref >60)
Glucose, Bld: 105 mg/dL — ABNORMAL HIGH (ref 70–99)
Phosphorus: 4.2 mg/dL (ref 2.5–4.6)
Potassium: 3.5 mmol/L (ref 3.5–5.1)
Sodium: 135 mmol/L (ref 135–145)

## 2023-06-30 LAB — CBC
HCT: 30.1 % — ABNORMAL LOW (ref 36.0–46.0)
Hemoglobin: 9.2 g/dL — ABNORMAL LOW (ref 12.0–15.0)
MCH: 29.7 pg (ref 26.0–34.0)
MCHC: 30.6 g/dL (ref 30.0–36.0)
MCV: 97.1 fL (ref 80.0–100.0)
Platelets: 296 10*3/uL (ref 150–400)
RBC: 3.1 MIL/uL — ABNORMAL LOW (ref 3.87–5.11)
RDW: 14.5 % (ref 11.5–15.5)
WBC: 5.2 10*3/uL (ref 4.0–10.5)
nRBC: 0 % (ref 0.0–0.2)

## 2023-06-30 LAB — IRON AND TIBC
Iron: 96 ug/dL (ref 28–170)
Saturation Ratios: 55 % — ABNORMAL HIGH (ref 10.4–31.8)
TIBC: 175 ug/dL — ABNORMAL LOW (ref 250–450)
UIBC: 79 ug/dL

## 2023-06-30 LAB — HEPATITIS B CORE ANTIBODY, TOTAL: HEP B CORE AB: NEGATIVE

## 2023-06-30 LAB — FERRITIN: Ferritin: 638 ng/mL — ABNORMAL HIGH (ref 11–307)

## 2023-06-30 LAB — FOLATE: Folate: 8.9 ng/mL (ref >5.9)

## 2023-06-30 LAB — VITAMIN B12: Vitamin B-12: 369 pg/mL (ref 180–914)

## 2023-06-30 MED ORDER — POLYETHYLENE GLYCOL 3350 17 G PO PACK
17.0000 g | PACK | Freq: Every day | ORAL | Status: DC
  Administered 2023-07-01 – 2023-07-07 (×5): 17 g via ORAL
  Filled 2023-06-30 (×6): qty 1

## 2023-06-30 MED ORDER — LACTULOSE 10 GM/15ML PO SOLN
20.0000 g | Freq: Once | ORAL | Status: DC
  Filled 2023-06-30: qty 30

## 2023-06-30 MED ORDER — BISACODYL 5 MG PO TBEC
5.0000 mg | DELAYED_RELEASE_TABLET | Freq: Every day | ORAL | Status: DC
  Administered 2023-07-01: 5 mg via ORAL
  Filled 2023-06-30: qty 1

## 2023-06-30 MED ORDER — HEPARIN SODIUM (PORCINE) 1000 UNIT/ML IJ SOLN
INTRAMUSCULAR | Status: AC
  Filled 2023-06-30: qty 4

## 2023-06-30 NOTE — Progress Notes (Signed)
 Progress Note   Patient: Tiffany [name] UJW:[id number] DOB: October 26, 1960 DOA: 06/27/2023     3 DOS: the patient was seen and examined on 06/30/2023   Brief hospital course: Per H&P HPI  Tiffany [name] is a 63 y.o. female with medical history significant of end-stage renal disease on hemodialysis who usually comes to the ER for her hemodialysis, chronic diastolic heart failure, schizoaffective disorder, anemia of chronic disease, morbid obesity, obstructive sleep apnea, GERD, tobacco abuse, history of seizure disorder who presented to the ER again with complaint of leg swelling.  Patient last dialysis was about 5 to 6 days ago.  The legs are red and she has had some fever.  Also some cough which is chronic.  Patient had recent runny nose but no nausea vomiting or diarrhea.  [name] not hypoxic.  [name] felt some pain in the legs that are swollen.  Patient was seen and evaluated.  Appears to have met sepsis criteria most likely cellulitis.  Versus UTI.  Patient complained of dysuria.   Assessment and Plan: Sepsis due to cellulitis and UTI POA Resolved  Had fever and leukocytosis and infection source. Remains afebrile, no leukocytosis Urine with E. Coli - Continue Ancef , discharge on  - Follow-up blood cultures  ESRD on HD MWF  States last HD this 5 d prior to admission.  Patient comes to the ED intermittently to be dialyzed. She does not want to return to her previous HD outpatient center because she does not have flexible transportation (relies on insurance provided transportation). Renal navigator working to assist patient in finding new outpatient HD.  When ready for discharge will cotninue with HD here at [facility], per CM team.   HD per nephrology  Normocytic anemia Anemia of chronic kidney disease  H/o Fe deficiency     Latest Ref Rng & Units 06/30/2023    5:00 AM 06/29/2023    6:33 AM 06/28/2023    9:02 AM  CBC  WBC 4.0 - 10.5 K/uL 5.2  6.9  13.8   Hemoglobin 12.0 - 15.0 g/dL 9.2  9.7  9.4    Hematocrit 36.0 - 46.0 % 30.1  31.0  29.6   Platelets 150 - 400 K/uL 296  218  237    Stable.  Likely multifactorial.  Anemia of chronic kidney disease component as well.  Iron /TIBC/Ferritin/ %Sat    Component Value Date/Time   IRON  96 06/30/2023 0500   IRON  44 06/25/2017 1626   TIBC 175 (L) 06/30/2023 0500   TIBC 290 06/25/2017 1626   FERRITIN 638 (H) 06/30/2023 0500   IRONPCTSAT 55 (H) 06/30/2023 0500   IRONPCTSAT 15 06/25/2017 1626  Iron  stores WNL.  ESA per nephrology   GERD  Noted.  Continue home PPI  Schizoaffective d/o  Continue home regimen  OSA  No CPAP at home  Continue to monitor   H/o anoxic brain injury  Stable.   Class I obesity  BMI 34  Noted       Subjective: No acute complaints this a.m. Physical Exam: Vitals:   06/30/23 1225 06/30/23 1241 06/30/23 1259 06/30/23 1700  BP: (!) 144/81  122/75 101/69  Pulse: 66  71 67  Resp: 15  18   Temp:   98.2 F (36.8 C) 98.3 F (36.8 C)  TempSrc:      SpO2: 100%  99% 99%  Weight:  82.3 kg    Height:       Physical Exam  Constitutional: In no distress.  Cardiovascular: Normal rate, regular rhythm. No lower extremity edema  Pulmonary: Non labored breathing on room air, no wheezing or rales.  Abdominal: Soft. Non distended and non tender Musculoskeletal: Normal range of motion.     Neurological: Alert and oriented to person, place, and time. Non focal  Skin: Skin is warm and dry. Rash on legs improved   Data Reviewed:     Latest Ref Rng & Units 06/30/2023    5:00 AM 06/29/2023    6:33 AM 06/28/2023    9:02 AM  CBC  WBC 4.0 - 10.5 K/uL 5.2  6.9  13.8   Hemoglobin 12.0 - 15.0 g/dL 9.2  9.7  9.4   Hematocrit 36.0 - 46.0 % 30.1  31.0  29.6   Platelets 150 - 400 K/uL 296  218  237       Latest Ref Rng & Units 06/30/2023    5:00 AM 06/28/2023    7:18 AM 06/27/2023    9:03 PM  BMP  Glucose 70 - 99 mg/dL 295  78    BUN 8 - 23 mg/dL 42  61    Creatinine 6.21 - 1.00 mg/dL 3.08  6.57  8.46   Sodium  135 - 145 mmol/L 135  134    Potassium 3.5 - 5.1 mmol/L 3.5  3.0    Chloride 98 - 111 mmol/L 98  99    CO2 22 - 32 mmol/L 25  19    Calcium  8.9 - 10.3 mg/dL 8.7  8.7       Family Communication: No family at bedside. Lives with elderly mother. Understands plan.   Disposition: Status is: Inpatient Remains inpatient appropriate because: IV antibiotics, safe dispo   Planned Discharge Destination: [facility]     Time spent: 35 minutes  Author: Joette Mustard, MD 06/30/2023 8:38 PM  For on call review [email or web address].

## 2023-06-30 NOTE — Plan of Care (Signed)
  Problem: Fluid Volume: Goal: Hemodynamic stability will improve Outcome: Progressing   Problem: Clinical Measurements: Goal: Diagnostic test results will improve Outcome: Progressing Goal: Signs and symptoms of infection will decrease Outcome: Progressing   Problem: Respiratory: Goal: Ability to maintain adequate ventilation will improve Outcome: Progressing   Problem: Fluid Volume: Goal: Hemodynamic stability will improve Outcome: Progressing   Problem: Clinical Measurements: Goal: Diagnostic test results will improve Outcome: Progressing Goal: Signs and symptoms of infection will decrease Outcome: Progressing   Problem: Respiratory: Goal: Ability to maintain adequate ventilation will improve Outcome: Progressing   Problem: Education: Goal: Knowledge of General Education information will improve Description: Including pain rating scale, medication(s)/side effects and non-pharmacologic comfort measures Outcome: Progressing   Problem: Health Behavior/Discharge Planning: Goal: Ability to manage health-related needs will improve Outcome: Progressing   Problem: Clinical Measurements: Goal: Ability to maintain clinical measurements within normal limits will improve Outcome: Progressing Goal: Will remain free from infection Outcome: Progressing Goal: Diagnostic test results will improve Outcome: Progressing Goal: Respiratory complications will improve Outcome: Progressing Goal: Cardiovascular complication will be avoided Outcome: Progressing   Problem: Activity: Goal: Risk for activity intolerance will decrease Outcome: Progressing   Problem: Nutrition: Goal: Adequate nutrition will be maintained Outcome: Progressing   Problem: Coping: Goal: Level of anxiety will decrease Outcome: Progressing   Problem: Elimination: Goal: Will not experience complications related to bowel motility Outcome: Progressing Goal: Will not experience complications related to  urinary retention Outcome: Progressing   Problem: Pain Managment: Goal: General experience of comfort will improve and/or be controlled Outcome: Progressing   Problem: Safety: Goal: Ability to remain free from injury will improve Outcome: Progressing   Problem: Skin Integrity: Goal: Risk for impaired skin integrity will decrease Outcome: Progressing

## 2023-06-30 NOTE — Progress Notes (Signed)
 [facility] Progress Note   Subjective:   Remains afebrile and leukocytosis resolved. Dysuria slowly improving.  Tolerating HD fine today.   Again says she will not return to [facility] for dialysis at any point.   Objective Vitals:   06/30/23 0421 06/30/23 0835 06/30/23 0847 06/30/23 0900  BP: 128/74 104/73 (!) 109/58 119/79  Pulse: 64 65 100 67  Resp: 14 (!) 100 15 16  Temp: 98 F (36.7 C) (!) 96.1 F (35.6 C) (!) 96.6 F (35.9 C)   TempSrc:      SpO2: 100%   99%  Weight:  84.3 kg 84.3 kg   Height:       Physical Exam General: comfortable in bed Heart:RRR Lungs:clear on RA Abdomen: soft Extremities: no edema Dialysis Access: RIJ [facility] c/d/i  Additional Objective Labs: Basic Metabolic Panel: Recent Labs  Lab 06/27/23 1629 06/27/23 2103 06/28/23 0717 06/28/23 0718  NA 130*  --   --  134*  K 3.1*  --   --  3.0*  CL 95*  --   --  99  CO2 18*  --   --  19*  GLUCOSE 87  --   --  78  BUN 62*  --   --  61*  CREATININE 6.09* 6.06*  --  5.91*  CALCIUM  9.1  --   --  8.7*  PHOS  --   --  5.5*  --    Liver Function Tests: Recent Labs  Lab 06/27/23 1629 06/28/23 0718  AST 16 13*  ALT 11 9  ALKPHOS 72 78  BILITOT 0.6 0.7  PROT 7.0 6.0*  ALBUMIN  3.4* 2.7*   No results for input(s): LIPASE, AMYLASE in the last 168 hours. CBC: Recent Labs  Lab 06/27/23 1629 06/27/23 2103 06/28/23 0718 06/28/23 0902 06/29/23 0633  WBC 19.6* 21.2* 15.2* 13.8* 6.9  NEUTROABS 18.0*  --   --   --  4.9  HGB 11.8* 9.7* 10.9* 9.4* 9.7*  HCT 37.8 30.8* 34.9* 29.6* 31.0*  MCV 95.5 94.5 96.1 95.2 96.3  PLT 258 257 245 237 218   Blood Culture    Component Value Date/Time   SDES URINE, RANDOM 06/27/2023 1751   SPECREQUEST  06/27/2023 1751    NONE Reflexed from [id number] Performed at [facility], [address], [place], [place] [address]    CULT >=100,000 COLONIES/mL ESCHERICHIA COLI (A) 06/27/2023 1751   REPTSTATUS 06/29/2023 FINAL 06/27/2023 1751    Cardiac  Enzymes: No results for input(s): CKTOTAL, CKMB, CKMBINDEX, TROPONINI in the last 168 hours. CBG: No results for input(s): GLUCAP in the last 168 hours. Iron  Studies: No results for input(s): IRON , TIBC, TRANSFERRIN, FERRITIN in the last 72 hours. @lablastinr3 @ Studies/Results: No results found.  Medications:   ceFAZolin  (ANCEF ) IV 1 g (06/29/23 2351)    amLODipine   10 mg Oral Daily   atorvastatin   20 mg Oral Daily   budesonide -glycopyrrolate -formoterol   2 puff Inhalation BID   carvedilol   12.5 mg Oral BID   Chlorhexidine  Gluconate Cloth  6 each Topical Daily   Chlorhexidine  Gluconate Cloth  6 each Topical Q0600   dicyclomine   10 mg Oral TID   DULoxetine   30 mg Oral Daily   escitalopram   10 mg Oral Daily   ferrous sulfate   325 mg Oral QODAY   folic acid   1 mg Oral Daily   heparin   5,000 Units Subcutaneous Q8H   lamoTRIgine   100 mg Oral Daily   metolazone   2.5 mg Oral Q  M,W,F   mirtazapine   15 mg Oral QHS   pantoprazole   40 mg Oral Daily   QUEtiapine   50 mg Oral BID   sodium chloride  flush  10-40 mL Intracatheter Q12H    Assessment/Plan:   # UTI: presented with fever, urine culture + E coli.  Currently on antibiotics per primary team.   # ESRD: Tol HD fine, tx today.  Assigned to [facility] but declines to go there.  Informed renal navigator who is looking into other HD unit for patient.  [facility] for access, working well.    # Hypertension: Resumed home medication, UF with HD.  Normotensive this AM   # Anemia of ESRD: Hemoglobin around goal.  Dose ESA weekly.   # Metabolic Bone Disease: Ca and phos ok. Does not appear to be on a binder.  #hypokalemia: 4K dialysate today, check labs tomorrow and if low supplement gently   Needs to remain admitted at this time while we look for another HD unit for her.  Should know something early this week.   Adrian Alba MD 06/30/2023, 9:19 AM  [facility] Pager: [phone]

## 2023-07-01 DIAGNOSIS — N186 End stage renal disease: Secondary | ICD-10-CM | POA: Diagnosis not present

## 2023-07-01 LAB — RENAL FUNCTION PANEL
Albumin: 2.6 g/dL — ABNORMAL LOW (ref 3.5–5.0)
Anion gap: 10 (ref 5–15)
BUN: 36 mg/dL — ABNORMAL HIGH (ref 8–23)
CO2: 25 mmol/L (ref 22–32)
Calcium: 8.6 mg/dL — ABNORMAL LOW (ref 8.9–10.3)
Chloride: 99 mmol/L (ref 98–111)
Creatinine, Ser: 3.71 mg/dL — ABNORMAL HIGH (ref 0.44–1.00)
GFR, Estimated: 13 mL/min — ABNORMAL LOW (ref >60)
Glucose, Bld: 119 mg/dL — ABNORMAL HIGH (ref 70–99)
Phosphorus: 3.8 mg/dL (ref 2.5–4.6)
Potassium: 3.7 mmol/L (ref 3.5–5.1)
Sodium: 134 mmol/L — ABNORMAL LOW (ref 135–145)

## 2023-07-01 MED ORDER — CEPHALEXIN 500 MG PO CAPS
500.0000 mg | ORAL_CAPSULE | ORAL | Status: AC
  Administered 2023-07-01 – 2023-07-04 (×4): 500 mg via ORAL
  Filled 2023-07-01 (×4): qty 1

## 2023-07-01 MED ORDER — GABAPENTIN 100 MG PO CAPS
100.0000 mg | ORAL_CAPSULE | Freq: Every day | ORAL | Status: DC
  Administered 2023-07-01 – 2023-07-06 (×6): 100 mg via ORAL
  Filled 2023-07-01 (×7): qty 1

## 2023-07-01 NOTE — Progress Notes (Signed)
 [facility] Progress Note   Subjective:   Patient reports feeling well this AM, denies SOB, CP, dizziness, nausea.   Objective Vitals:   06/30/23 1259 06/30/23 1700 06/30/23 2057 07/01/23 0752  BP: 122/75 101/69 101/74 122/79  Pulse: 71 67 66 72  Resp: 18  19 16   Temp: 98.2 F (36.8 C) 98.3 F (36.8 C) 98.4 F (36.9 C) 98 F (36.7 C)  TempSrc:   Oral Oral  SpO2: 99% 99% 100% 98%  Weight:      Height:       Physical Exam General: Alert female in NAD Heart: RRR, no murmurs, rubs or gallops Lungs: CTA bilaterally, respirations unlabored on RA Abdomen: Soft, non-distended, +BS Extremities: No edema b/l lower extremities Dialysis Access: R internal jugular [facility]  Additional Objective Labs: Basic Metabolic Panel: Recent Labs  Lab 06/27/23 1629 06/27/23 2103 06/28/23 0717 06/28/23 0718 06/30/23 0500  NA 130*  --   --  134* 135  K 3.1*  --   --  3.0* 3.5  CL 95*  --   --  99 98  CO2 18*  --   --  19* 25  GLUCOSE 87  --   --  78 105*  BUN 62*  --   --  61* 42*  CREATININE 6.09* 6.06*  --  5.91* 4.50*  CALCIUM  9.1  --   --  8.7* 8.7*  PHOS  --   --  5.5*  --  4.2   Liver Function Tests: Recent Labs  Lab 06/27/23 1629 06/28/23 0718 06/30/23 0500  AST 16 13*  --   ALT 11 9  --   ALKPHOS 72 78  --   BILITOT 0.6 0.7  --   PROT 7.0 6.0*  --   ALBUMIN  3.4* 2.7* 2.4*   No results for input(s): LIPASE, AMYLASE in the last 168 hours. CBC: Recent Labs  Lab 06/27/23 1629 06/27/23 2103 06/28/23 0718 06/28/23 0902 06/29/23 0633 06/30/23 0500  WBC 19.6* 21.2* 15.2* 13.8* 6.9 5.2  NEUTROABS 18.0*  --   --   --  4.9  --   HGB 11.8* 9.7* 10.9* 9.4* 9.7* 9.2*  HCT 37.8 30.8* 34.9* 29.6* 31.0* 30.1*  MCV 95.5 94.5 96.1 95.2 96.3 97.1  PLT 258 257 245 237 218 296   Blood Culture    Component Value Date/Time   SDES URINE, RANDOM 06/27/2023 1751   SPECREQUEST  06/27/2023 1751    NONE Reflexed from [id number] Performed at [facility],  [address], [place], [place] [address]    CULT >=100,000 COLONIES/mL ESCHERICHIA COLI (A) 06/27/2023 1751   REPTSTATUS 06/29/2023 FINAL 06/27/2023 1751    Cardiac Enzymes: No results for input(s): CKTOTAL, CKMB, CKMBINDEX, TROPONINI in the last 168 hours. CBG: No results for input(s): GLUCAP in the last 168 hours. Iron  Studies:  Recent Labs    06/30/23 0500  IRON  96  TIBC 175*  FERRITIN 638*   @lablastinr3 @ Studies/Results: No results found. Medications:   ceFAZolin  (ANCEF ) IV 1 g (06/30/23 2147)    amLODipine   10 mg Oral Daily   atorvastatin   20 mg Oral Daily   bisacodyl   5 mg Oral Daily   budesonide -glycopyrrolate -formoterol   2 puff Inhalation BID   carvedilol   12.5 mg Oral BID   Chlorhexidine  Gluconate Cloth  6 each Topical Daily   Chlorhexidine  Gluconate Cloth  6 each Topical Q0600   dicyclomine   10 mg Oral TID   DULoxetine   30 mg Oral Daily  escitalopram   10 mg Oral Daily   folic acid   1 mg Oral Daily   heparin   5,000 Units Subcutaneous Q8H   lamoTRIgine   100 mg Oral Daily   metolazone   2.5 mg Oral Q M,W,F   mirtazapine   15 mg Oral QHS   pantoprazole   40 mg Oral Daily   polyethylene glycol  17 g Oral Daily   QUEtiapine   50 mg Oral BID   sodium chloride  flush  10-40 mL Intracatheter Q12H    Assessment/Plan: # UTI: presented with fever, urine culture + E coli.  Currently on antibiotics per primary team.   # ESRD: Assigned to [facility] but declines to go there.  Informed renal navigator who is looking into other HD unit for patient.  [facility] for access, working well. Continue TTS schedule.    # Hypertension: Resumed home medication, UF with HD.  Normotensive this AM   # Anemia of ESRD: Hemoglobin 9.2.  Dose ESA weekly.   # Metabolic Bone Disease: Ca and phos ok. Does not appear to be on a binder, not needed at this time.    #hypokalemia: Using 4K bath with HD, K+ at goal today.    Needs to remain admitted at this time while we look for another HD unit for  her.  Should know something early this week.   Ramona Burner, PA-C 07/01/2023, 8:28 AM  [facility] Pager: [phone]

## 2023-07-01 NOTE — Progress Notes (Signed)
 [facility] Progress Note Patient: Tiffany [name] ZOX:[id number] DOB: Jul 07, 1960 DOA: 06/27/2023  DOS: the patient was seen and examined on 07/01/2023  Brief Hospital Course: PMH of ESRD on HD, HFpEF, schizoaffective disorder, anemia of CKD, morbid obesity, OSA, GERD, active smoker, seizures presented to the hospital with complaints of leg swelling. Missing hemodialysis. Nephrology consulted. Currently medically stable awaiting outpatient clip. Assessment and Plan: Cellulitis with sepsis. Suspected UTI. Present on admission. Was on IV ceftriaxone .  Currently on IV cefazolin .  Switch to oral antibiotic.  ESRD on HD. MWF. Currently awaiting outpatient clinic. Appreciate oncology consultation.  Anemia of chronic kidney disease. H&H stable.  Monitor.  Neuropathy. Patient reports chronic neuropathy. Will initiate gabapentin .  OSA. Monitor for now.  GERD. Continue PPI.  Schizoaffective disorder. Will continue home medication.  Currently no abnormality.  Obesity Class 1 Body mass index is 33.19 kg/m.  Placing the pt at higher risk of poor outcomes.   Subjective: No nausea no vomiting no fever no chills.  No chest pain.  Reports bilateral leg neuropathy tingling numbness mainly  Physical Exam: General: in Mild distress, No Rash Cardiovascular: S1 and S2 Present, No Murmur Respiratory: Good respiratory effort, Bilateral Air entry present. No Crackles, No wheezes Abdomen: Bowel Sound present, No tenderness Extremities: No edema Neuro: Alert and oriented x3, no new focal deficit  Data Reviewed: I have Reviewed nursing notes, Vitals, and Lab results. Since last encounter, pertinent lab results CBC and BMP   . I have ordered test including CBC and BMP  .   Disposition: Status is: Inpatient Remains inpatient appropriate because: Awaiting arrangement of clip  heparin  injection 5,000 Units Start: 06/27/23 2200   Family Communication: No one at bedside Level of care:  Telemetry Medical   Vitals:   06/30/23 1700 06/30/23 2057 07/01/23 0752 07/01/23 1437  BP: 101/69 101/74 122/79 106/71  Pulse: 67 66 72 65  Resp:  19 16 18   Temp: 98.3 F (36.8 C) 98.4 F (36.9 C) 98 F (36.7 C) 98 F (36.7 C)  TempSrc:  Oral Oral Oral  SpO2: 99% 100% 98% 98%  Weight:      Height:         Author: Charlean Congress, MD 07/01/2023 7:17 PM  Please look on [email or web address] to find out who is on call.

## 2023-07-01 NOTE — Plan of Care (Signed)
  Problem: Fluid Volume: Goal: Hemodynamic stability will improve Outcome: Progressing   Problem: Clinical Measurements: Goal: Diagnostic test results will improve Outcome: Progressing Goal: Signs and symptoms of infection will decrease Outcome: Progressing   Problem: Respiratory: Goal: Ability to maintain adequate ventilation will improve Outcome: Progressing   Problem: Fluid Volume: Goal: Hemodynamic stability will improve Outcome: Progressing   Problem: Clinical Measurements: Goal: Diagnostic test results will improve Outcome: Progressing Goal: Signs and symptoms of infection will decrease Outcome: Progressing   Problem: Respiratory: Goal: Ability to maintain adequate ventilation will improve Outcome: Progressing   Problem: Education: Goal: Knowledge of General Education information will improve Description: Including pain rating scale, medication(s)/side effects and non-pharmacologic comfort measures Outcome: Progressing   Problem: Health Behavior/Discharge Planning: Goal: Ability to manage health-related needs will improve Outcome: Progressing   Problem: Clinical Measurements: Goal: Ability to maintain clinical measurements within normal limits will improve Outcome: Progressing Goal: Will remain free from infection Outcome: Progressing Goal: Diagnostic test results will improve Outcome: Progressing Goal: Respiratory complications will improve Outcome: Progressing Goal: Cardiovascular complication will be avoided Outcome: Progressing   Problem: Activity: Goal: Risk for activity intolerance will decrease Outcome: Progressing   Problem: Nutrition: Goal: Adequate nutrition will be maintained Outcome: Progressing   Problem: Coping: Goal: Level of anxiety will decrease Outcome: Progressing   Problem: Elimination: Goal: Will not experience complications related to bowel motility Outcome: Progressing Goal: Will not experience complications related to  urinary retention Outcome: Progressing   Problem: Pain Managment: Goal: General experience of comfort will improve and/or be controlled Outcome: Progressing   Problem: Safety: Goal: Ability to remain free from injury will improve Outcome: Progressing   Problem: Skin Integrity: Goal: Risk for impaired skin integrity will decrease Outcome: Progressing

## 2023-07-01 NOTE — Hospital Course (Signed)
 PMH of ESRD on HD, HFpEF, schizoaffective disorder, anemia of CKD, morbid obesity, OSA, GERD, active smoker, seizures presented to the hospital with complaints of leg swelling. Missing hemodialysis. Nephrology consulted. Currently medically stable awaiting outpatient clip. Assessment and Plan: Cellulitis with sepsis. Suspected UTI. Present on admission. Was on IV ceftriaxone .  Later on IV cefazolin .  Switch to oral antibiotic.  Patient still reports burning urination although no fever.  ESRD on HD. MWF. Currently awaiting outpatient clinic. Appreciate oncology consultation.  Anemia of chronic kidney disease. H&H stable.  Monitor.  Neuropathy. Patient reports chronic neuropathy. Started on gabapentin .  Reports improvement in pain with gabapentin .  OSA. Monitor for now.  GERD. Continue PPI.  Schizoaffective disorder. Will continue home medication.  Currently no abnormality.  Obesity Class 1 Body mass index is 33.19 kg/m.  Placing the pt at higher risk of poor outcomes.

## 2023-07-02 DIAGNOSIS — A419 Sepsis, unspecified organism: Secondary | ICD-10-CM | POA: Diagnosis not present

## 2023-07-02 LAB — CULTURE, BLOOD (ROUTINE X 2)
Culture: NO GROWTH
Culture: NO GROWTH

## 2023-07-02 MED ORDER — CAMPHOR-MENTHOL 0.5-0.5 % EX LOTN
TOPICAL_LOTION | CUTANEOUS | Status: DC | PRN
  Filled 2023-07-02: qty 222

## 2023-07-02 NOTE — Plan of Care (Signed)
  Problem: Fluid Volume: Goal: Hemodynamic stability will improve Outcome: Progressing   Problem: Clinical Measurements: Goal: Diagnostic test results will improve Outcome: Progressing Goal: Signs and symptoms of infection will decrease Outcome: Progressing   Problem: Respiratory: Goal: Ability to maintain adequate ventilation will improve Outcome: Progressing   Problem: Fluid Volume: Goal: Hemodynamic stability will improve Outcome: Progressing   Problem: Clinical Measurements: Goal: Diagnostic test results will improve Outcome: Progressing Goal: Signs and symptoms of infection will decrease Outcome: Progressing   Problem: Respiratory: Goal: Ability to maintain adequate ventilation will improve Outcome: Progressing   Problem: Education: Goal: Knowledge of General Education information will improve Description: Including pain rating scale, medication(s)/side effects and non-pharmacologic comfort measures Outcome: Progressing   Problem: Health Behavior/Discharge Planning: Goal: Ability to manage health-related needs will improve Outcome: Progressing   Problem: Clinical Measurements: Goal: Ability to maintain clinical measurements within normal limits will improve Outcome: Progressing Goal: Will remain free from infection Outcome: Progressing Goal: Diagnostic test results will improve Outcome: Progressing Goal: Respiratory complications will improve Outcome: Progressing Goal: Cardiovascular complication will be avoided Outcome: Progressing   Problem: Activity: Goal: Risk for activity intolerance will decrease Outcome: Progressing   Problem: Nutrition: Goal: Adequate nutrition will be maintained Outcome: Progressing   Problem: Coping: Goal: Level of anxiety will decrease Outcome: Progressing   Problem: Elimination: Goal: Will not experience complications related to bowel motility Outcome: Progressing Goal: Will not experience complications related to  urinary retention Outcome: Progressing   Problem: Pain Managment: Goal: General experience of comfort will improve and/or be controlled Outcome: Progressing   Problem: Safety: Goal: Ability to remain free from injury will improve Outcome: Progressing   Problem: Skin Integrity: Goal: Risk for impaired skin integrity will decrease Outcome: Progressing

## 2023-07-02 NOTE — Progress Notes (Signed)
 Tiffany [name] Progress Note   Subjective:   NO issues today.  Sitting in chair.    Objective Vitals:   07/01/23 1437 07/01/23 2007 07/02/23 0512 07/02/23 0739  BP: 106/71 126/72 (!) 94/57 (!) 114/97  Pulse: 65 73 62   Resp: 18 19 18    Temp: 98 F (36.7 C) 98.3 F (36.8 C) 98.1 F (36.7 C) 98 F (36.7 C)  TempSrc: Oral   Oral  SpO2: 98% 99% 94% 98%  Weight:      Height:       Physical Exam General: Alert female in NAD Heart: RRR, no murmurs, rubs or gallops Lungs: CTA bilaterally, respirations unlabored on RA Abdomen: Soft, non-distended, +BS Extremities: No edema b/l lower extremities Dialysis Access: R internal jugular [facility]  Additional Objective Labs: Basic Metabolic Panel: Recent Labs  Lab 06/28/23 0717 06/28/23 0718 06/30/23 0500 07/01/23 0816  NA  --  134* 135 134*  K  --  3.0* 3.5 3.7  CL  --  99 98 99  CO2  --  19* 25 25  GLUCOSE  --  78 105* 119*  BUN  --  61* 42* 36*  CREATININE  --  5.91* 4.50* 3.71*  CALCIUM   --  8.7* 8.7* 8.6*  PHOS 5.5*  --  4.2 3.8   Liver Function Tests: Recent Labs  Lab 06/27/23 1629 06/28/23 0718 06/30/23 0500 07/01/23 0816  AST 16 13*  --   --   ALT 11 9  --   --   ALKPHOS 72 78  --   --   BILITOT 0.6 0.7  --   --   PROT 7.0 6.0*  --   --   ALBUMIN  3.4* 2.7* 2.4* 2.6*   No results for input(s): LIPASE, AMYLASE in the last 168 hours. CBC: Recent Labs  Lab 06/27/23 1629 06/27/23 2103 06/28/23 0718 06/28/23 0902 06/29/23 0633 06/30/23 0500  WBC 19.6* 21.2* 15.2* 13.8* 6.9 5.2  NEUTROABS 18.0*  --   --   --  4.9  --   HGB 11.8* 9.7* 10.9* 9.4* 9.7* 9.2*  HCT 37.8 30.8* 34.9* 29.6* 31.0* 30.1*  MCV 95.5 94.5 96.1 95.2 96.3 97.1  PLT 258 257 245 237 218 296   Blood Culture    Component Value Date/Time   SDES URINE, RANDOM 06/27/2023 1751   SPECREQUEST  06/27/2023 1751    NONE Reflexed from [id number] Performed at [facility], [address] [place], [place] [address]    CULT  >=100,000 COLONIES/mL ESCHERICHIA COLI (A) 06/27/2023 1751   REPTSTATUS 06/29/2023 FINAL 06/27/2023 1751    Cardiac Enzymes: No results for input(s): CKTOTAL, CKMB, CKMBINDEX, TROPONINI in the last 168 hours. CBG: No results for input(s): GLUCAP in the last 168 hours. Iron  Studies:  Recent Labs    06/30/23 0500  IRON  96  TIBC 175*  FERRITIN 638*   @lablastinr3 @ Studies/Results: No results found. Medications:    amLODipine   10 mg Oral Daily   atorvastatin   20 mg Oral Daily   budesonide -glycopyrrolate -formoterol   2 puff Inhalation BID   carvedilol   12.5 mg Oral BID   cephALEXin   500 mg Oral Q24H   Chlorhexidine  Gluconate Cloth  6 each Topical Daily   Chlorhexidine  Gluconate Cloth  6 each Topical Q0600   dicyclomine   10 mg Oral TID   DULoxetine   30 mg Oral Daily   escitalopram   10 mg Oral Daily   folic acid   1 mg Oral Daily   gabapentin   100  mg Oral QHS   heparin   5,000 Units Subcutaneous Q8H   lamoTRIgine   100 mg Oral Daily   metolazone   2.5 mg Oral Q M,W,F   mirtazapine   15 mg Oral QHS   pantoprazole   40 mg Oral Daily   polyethylene glycol  17 g Oral Daily   QUEtiapine   50 mg Oral BID   sodium chloride  flush  10-40 mL Intracatheter Q12H    Assessment/Plan: # UTI: presented with fever, urine culture + E coli.  Currently on antibiotics per primary team.   # ESRD: Assigned to [facility] but declines to go there.  Informed renal navigator who is looking into other HD unit for patient.  [facility] for access, working well. Continue TTS schedule.    # Hypertension: Resumed home medication, UF with HD.  Normotensive this AM   # Anemia of ESRD: Hemoglobin 9.2.  Dose ESA weekly.   # Metabolic Bone Disease: Ca and phos ok. Does not appear to be on a binder, not needed at this time.    #hypokalemia: Using 4K bath with HD, K+ at goal today.    Needs to remain admitted at this time while we look for another HD unit for her.  Should know something early this week.   Tiffany [name] 07/02/2023, 1:23 PM  Tiffany [name]

## 2023-07-02 NOTE — TOC Progression Note (Signed)
 Transition of Care [facility]) - Progression Note    Patient Details  Name: Tiffany [name] MRN: [id number] Date of Birth: March 14, 1960  Transition of Care [facility]) CM/SW Contact  Tom-Johnson, Revan Gendron Daphne, RN Phone Number: 07/02/2023, 3:51 PM  Clinical Narrative:     Patient awaiting outpatient HD Clinic. Referral to [facility] for out-pt HD Clinic in [facility] still pending per Renal Navigator. Nephrology following for iHD.  CM will continue to follow as patient progresses with care towards discharge.        Expected Discharge Plan and Services                                               Social Determinants of Health (SDOH) Interventions SDOH Screenings   Food Insecurity: Food Insecurity Present (06/28/2023)  Housing: Low Risk  (06/28/2023)  Transportation Needs: No Transportation Needs (06/28/2023)  Utilities: Not At Risk (06/28/2023)  Alcohol Screen: Low Risk  (11/10/2022)  Depression (PHQ2-9): Low Risk  (07/11/2022)  Financial Resource Strain: Low Risk  (07/11/2022)  Physical Activity: Inactive (07/11/2022)  Social Connections: Socially Isolated (06/28/2023)  Stress: Stress Concern Present (07/11/2022)  Tobacco Use: Medium Risk (06/27/2023)    Readmission Risk Interventions    06/29/2023    3:13 PM 06/05/2022    2:47 PM 02/07/2022    2:22 PM  Readmission Risk Prevention Plan  Transportation Screening Complete Complete Complete  Medication Review (RN Care Manager) Referral to Pharmacy Referral to Pharmacy Complete  PCP or Specialist appointment within 3-5 days of discharge Complete Complete Complete  HRI or Home Care Consult Complete Complete Complete  SW Recovery Care/Counseling Consult Complete Complete Complete  Palliative Care Screening Not Applicable Not Applicable Not Applicable  Skilled Nursing Facility Not Applicable Not Applicable Not Applicable

## 2023-07-02 NOTE — Progress Notes (Signed)
 Referral to [facility] for out-pt HD clinic in [place] (requesting [facility]) is still pending. Additional clinicals faxed this morning. Will await determination.   Lauraine Polite Renal Navigator [phone]

## 2023-07-02 NOTE — Progress Notes (Signed)
 [facility] Progress Note Patient: Tiffany [name] NWG:[id number] DOB: 29-Aug-1960 DOA: 06/27/2023  DOS: the patient was seen and examined on 07/02/2023  Brief Hospital Course: PMH of ESRD on HD, HFpEF, schizoaffective disorder, anemia of CKD, morbid obesity, OSA, GERD, active smoker, seizures presented to the hospital with complaints of leg swelling. Missing hemodialysis. Nephrology consulted. Currently medically stable awaiting outpatient clip. Assessment and Plan: Cellulitis with sepsis. Suspected UTI. Present on admission. Was on IV ceftriaxone .  Later on IV cefazolin .  Switch to oral antibiotic.  Patient still reports burning urination although no fever.  ESRD on HD. MWF. Currently awaiting outpatient clinic. Appreciate oncology consultation.  Anemia of chronic kidney disease. H&H stable.  Monitor.  Neuropathy. Patient reports chronic neuropathy. Started on gabapentin .  Reports improvement in pain with gabapentin .  OSA. Monitor for now.  GERD. Continue PPI.  Schizoaffective disorder. Will continue home medication.  Currently no abnormality.  Obesity Class 1 Body mass index is 33.19 kg/m.  Placing the pt at higher risk of poor outcomes.   Subjective: Reports dysuria.  No nausea, vomiting fever no chills.  Was sleeping in the chair.  Reported some itching.  Physical Exam: General: in Mild distress, No Rash Cardiovascular: S1 and S2 Present, No Murmur Respiratory: Good respiratory effort, Bilateral Air entry present. No Crackles, No wheezes Abdomen: Bowel Sound present, suprapubic tenderness Extremities: No edema Neuro: Alert and oriented x3, no new focal deficit  Data Reviewed: I have Reviewed nursing notes, Vitals, and Lab results. Since last encounter, pertinent lab results CBC and BMP   . I have ordered test including CBC and BMP  .   Disposition: Status is: Inpatient Remains inpatient appropriate because: Awaiting clip  heparin  injection 5,000 Units  Start: 06/27/23 2200   Family Communication: No one at bedside Level of care: Telemetry Medical   Vitals:   07/01/23 1437 07/01/23 2007 07/02/23 0512 07/02/23 0739  BP: 106/71 126/72 (!) 94/57 (!) 114/97  Pulse: 65 73 62   Resp: 18 19 18    Temp: 98 F (36.7 C) 98.3 F (36.8 C) 98.1 F (36.7 C) 98 F (36.7 C)  TempSrc: Oral   Oral  SpO2: 98% 99% 94% 98%  Weight:      Height:         Author: Charlean Congress, MD 07/02/2023 2:18 PM  Please look on [email or web address] to find out who is on call.

## 2023-07-03 DIAGNOSIS — A419 Sepsis, unspecified organism: Secondary | ICD-10-CM | POA: Diagnosis not present

## 2023-07-03 LAB — CBC
HCT: 29.5 % — ABNORMAL LOW (ref 36.0–46.0)
HCT: 29.3 % — ABNORMAL LOW (ref 36.0–46.0)
Hemoglobin: 9.2 g/dL — ABNORMAL LOW (ref 12.0–15.0)
Hemoglobin: 9.1 g/dL — ABNORMAL LOW (ref 12.0–15.0)
MCH: 29.6 pg (ref 26.0–34.0)
MCH: 29.2 pg (ref 26.0–34.0)
MCHC: 31.2 g/dL (ref 30.0–36.0)
MCHC: 31.1 g/dL (ref 30.0–36.0)
MCV: 94.9 fL (ref 80.0–100.0)
MCV: 93.9 fL (ref 80.0–100.0)
Platelets: 289 10*3/uL (ref 150–400)
Platelets: 297 10*3/uL (ref 150–400)
RBC: 3.11 MIL/uL — ABNORMAL LOW (ref 3.87–5.11)
RBC: 3.12 MIL/uL — ABNORMAL LOW (ref 3.87–5.11)
RDW: 14.1 % (ref 11.5–15.5)
RDW: 14 % (ref 11.5–15.5)
WBC: 5.3 10*3/uL (ref 4.0–10.5)
WBC: 6.6 10*3/uL (ref 4.0–10.5)
nRBC: 0 % (ref 0.0–0.2)
nRBC: 0 % (ref 0.0–0.2)

## 2023-07-03 LAB — BASIC METABOLIC PANEL WITH GFR
Anion gap: 9 (ref 5–15)
BUN: 66 mg/dL — ABNORMAL HIGH (ref 8–23)
CO2: 25 mmol/L (ref 22–32)
Calcium: 9 mg/dL (ref 8.9–10.3)
Chloride: 95 mmol/L — ABNORMAL LOW (ref 98–111)
Creatinine, Ser: 5.04 mg/dL — ABNORMAL HIGH (ref 0.44–1.00)
GFR, Estimated: 9 mL/min — ABNORMAL LOW (ref >60)
Glucose, Bld: 87 mg/dL (ref 70–99)
Potassium: 4.1 mmol/L (ref 3.5–5.1)
Sodium: 129 mmol/L — ABNORMAL LOW (ref 135–145)

## 2023-07-03 MED ORDER — HYDROXYZINE HCL 25 MG PO TABS
25.0000 mg | ORAL_TABLET | Freq: Three times a day (TID) | ORAL | Status: DC
  Administered 2023-07-03 – 2023-07-04 (×3): 25 mg via ORAL
  Filled 2023-07-03 (×3): qty 1

## 2023-07-03 MED ORDER — HEPARIN SODIUM (PORCINE) 1000 UNIT/ML IJ SOLN
3200.0000 [IU] | Freq: Once | INTRAMUSCULAR | Status: AC
  Administered 2023-07-03: 3200 [IU]

## 2023-07-03 MED ORDER — HEPARIN SODIUM (PORCINE) 1000 UNIT/ML IJ SOLN
INTRAMUSCULAR | Status: AC
  Filled 2023-07-03: qty 4

## 2023-07-03 NOTE — Progress Notes (Signed)
 Advised by [facility] that they are awaiting information from [facility]. Contacted [facility] clinic staff to request that pt's information be provided to [facility] staff as soon as possible. Awaiting final determination from [facility] regarding possible clinic placement for pt at [facility] in [facility]. Will assist as needed.   Lauraine Polite Renal Navigator [phone]

## 2023-07-03 NOTE — Progress Notes (Signed)
 [facility] Progress Note   Subjective:   NO issues today.  Sleeping, arousable, no needs expressed  Objective Vitals:   07/03/23 0848 07/03/23 0854 07/03/23 0930 07/03/23 1000  BP: (!) 92/49  (!) 91/55 (!) 92/59  Pulse: 66  69 72  Resp: 18  20 19   Temp: (!) 97 F (36.1 C)     TempSrc: Oral     SpO2:   97% 97%  Weight:  85.4 kg    Height:       Physical Exam General: Alert female in NAD Heart: RRR, no murmurs, rubs or gallops Lungs: CTA bilaterally, respirations unlabored on RA Abdomen: Soft, non-distended, +BS Extremities: No edema b/l lower extremities Dialysis Access: R internal jugular [facility]  Additional Objective Labs: Basic Metabolic Panel: Recent Labs  Lab 06/28/23 0717 06/28/23 0718 06/30/23 0500 07/01/23 0816 07/03/23 0601  NA  --    < > 135 134* 129*  K  --    < > 3.5 3.7 4.1  CL  --    < > 98 99 95*  CO2  --    < > 25 25 25   GLUCOSE  --    < > 105* 119* 87  BUN  --    < > 42* 36* 66*  CREATININE  --    < > 4.50* 3.71* 5.04*  CALCIUM   --    < > 8.7* 8.6* 9.0  PHOS 5.5*  --  4.2 3.8  --    < > = values in this interval not displayed.   Liver Function Tests: Recent Labs  Lab 06/27/23 1629 06/28/23 0718 06/30/23 0500 07/01/23 0816  AST 16 13*  --   --   ALT 11 9  --   --   ALKPHOS 72 78  --   --   BILITOT 0.6 0.7  --   --   PROT 7.0 6.0*  --   --   ALBUMIN  3.4* 2.7* 2.4* 2.6*   No results for input(s): LIPASE, AMYLASE in the last 168 hours. CBC: Recent Labs  Lab 06/27/23 1629 06/27/23 2103 06/28/23 0718 06/28/23 0902 06/29/23 0633 06/30/23 0500 07/03/23 0601  WBC 19.6*   < > 15.2* 13.8* 6.9 5.2 5.3  NEUTROABS 18.0*  --   --   --  4.9  --   --   HGB 11.8*   < > 10.9* 9.4* 9.7* 9.2* 9.2*  HCT 37.8   < > 34.9* 29.6* 31.0* 30.1* 29.5*  MCV 95.5   < > 96.1 95.2 96.3 97.1 94.9  PLT 258   < > 245 237 218 296 289   < > = values in this interval not displayed.   Blood Culture    Component Value Date/Time   SDES URINE,  RANDOM 06/27/2023 1751   SPECREQUEST  06/27/2023 1751    NONE Reflexed from [id number] Performed at [facility], [address], [place], [place] [address]    CULT >=100,000 COLONIES/mL ESCHERICHIA COLI (A) 06/27/2023 1751   REPTSTATUS 06/29/2023 FINAL 06/27/2023 1751    Cardiac Enzymes: No results for input(s): CKTOTAL, CKMB, CKMBINDEX, TROPONINI in the last 168 hours. CBG: No results for input(s): GLUCAP in the last 168 hours. Iron  Studies:  No results for input(s): IRON , TIBC, TRANSFERRIN, FERRITIN in the last 72 hours.  @lablastinr3 @ Studies/Results: No results found. Medications:    amLODipine   10 mg Oral Daily   atorvastatin   20 mg Oral Daily   budesonide -glycopyrrolate -formoterol   2 puff Inhalation BID  carvedilol   12.5 mg Oral BID   cephALEXin   500 mg Oral Q24H   Chlorhexidine  Gluconate Cloth  6 each Topical Daily   Chlorhexidine  Gluconate Cloth  6 each Topical Q0600   dicyclomine   10 mg Oral TID   DULoxetine   30 mg Oral Daily   escitalopram   10 mg Oral Daily   folic acid   1 mg Oral Daily   gabapentin   100 mg Oral QHS   heparin   5,000 Units Subcutaneous Q8H   lamoTRIgine   100 mg Oral Daily   metolazone   2.5 mg Oral Q M,W,F   mirtazapine   15 mg Oral QHS   pantoprazole   40 mg Oral Daily   polyethylene glycol  17 g Oral Daily   QUEtiapine   50 mg Oral BID   sodium chloride  flush  10-40 mL Intracatheter Q12H    Assessment/Plan: # UTI: presented with fever, urine culture + E coli.  Currently on antibiotics per primary team.   # ESRD: Assigned to [facility] but declines to go there.  Informed renal navigator who is looking into other HD unit for patient.  [facility] for access, working well. Continue TTS schedule. [facility]ing to go to [facility]   # Hypertension: Resumed home medication, UF with HD.  Normotensive this AM   # Anemia of ESRD: Hemoglobin 9.2.  Dose ESA weekly.   # Metabolic Bone Disease: Ca and phos ok. Does not appear to be on a binder, not needed  at this time.    #hypokalemia: added K bath with dialysis     [name] 07/03/2023, 10:46 AM  [facility]

## 2023-07-03 NOTE — Progress Notes (Signed)
 patient treatment stopped due to blood leak detection, nusre aware. fluid removal 0.9, volume processed 36.5 treament duration 1:42 patient will have new setup on new machine nurse aware

## 2023-07-03 NOTE — Procedures (Signed)
 Patient seen and examined on Hemodialysis. The procedure was supervised and I have made appropriate changes. BP (!) 92/59   Pulse 72   Temp (!) 97 F (36.1 C) (Oral)   Resp 19   Ht 5' 2 (1.575 m)   Wt 85.4 kg   SpO2 97%   BMI 34.44 kg/m   QB 400 mL/ min via TDC, UF goal 2L  Tolerating treatment without complaints at this time.   [name] [facility] [phone] 10:47 AM

## 2023-07-03 NOTE — Plan of Care (Signed)
  Problem: Fluid Volume: Goal: Hemodynamic stability will improve Outcome: Progressing   Problem: Clinical Measurements: Goal: Diagnostic test results will improve Outcome: Progressing Goal: Signs and symptoms of infection will decrease Outcome: Progressing   Problem: Respiratory: Goal: Ability to maintain adequate ventilation will improve Outcome: Progressing   Problem: Fluid Volume: Goal: Hemodynamic stability will improve Outcome: Progressing   Problem: Clinical Measurements: Goal: Diagnostic test results will improve Outcome: Progressing Goal: Signs and symptoms of infection will decrease Outcome: Progressing

## 2023-07-03 NOTE — Progress Notes (Addendum)
 Received patient in bed.Alert and oriented x 4.Consent verified.  Access used: Right hd catheter.Arterial limb has tight blood backflow.treatment done on reverse connection.Dressing on date.  Duration of treatment : 1.75 hours, see hemo comment.  Net uf:  900 ml.  Hemo comment: Blood leak positive on each two different hd machine set-up.HD blood leak protocol followed.Hd CN,Unit's leadership and Renal MD made aware. Treatment stopped.Safety Portal report filed.No adverse reaction to the patient on assessment.  Hand off to the patient's nurse ,back into her room with stable condition via transporter.

## 2023-07-03 NOTE — Progress Notes (Signed)
 [facility] Progress Note Patient: Tiffany [name] UEA:[id number] DOB: 22-Dec-1960 DOA: 06/27/2023  DOS: the patient was seen and examined on 07/03/2023  Brief Hospital Course: PMH of ESRD on HD, HFpEF, schizoaffective disorder, anemia of CKD, morbid obesity, OSA, GERD, active smoker, seizures presented to the hospital with complaints of leg swelling. Missing hemodialysis. Nephrology consulted. Currently medically stable awaiting outpatient clip.  Assessment and Plan: Cellulitis with sepsis. Suspected UTI. Present on admission. Was on IV ceftriaxone .  Later on IV cefazolin .  Switch to oral antibiotic.  Patient still reports burning urination although no fever.  ESRD on HD. MWF. Currently awaiting outpatient clinic. Appreciate oncology consultation.  Anemia of chronic kidney disease. H&H stable.  Monitor.  Neuropathy. Patient reports chronic neuropathy. Started on gabapentin .  Reports improvement in pain with gabapentin .  OSA. Monitor for now.  GERD. Continue PPI.  Schizoaffective disorder. Will continue home medication.  Currently no abnormality.  Itching. Changing her Vistaril  to scheduled medication. Continue Sarna lotion.  Obesity Class 1 Body mass index is 34.07 kg/m.  Placing the pt at higher risk of poor outcomes.   Subjective: Still reports itching.  Tells me that she takes 2 Benadryls 4 times a day at home for itching!  Physical Exam: Clear to auscultation. S1-S2 present. No significant rash seen. No edema. No asterixis.  Data Reviewed: I have Reviewed nursing notes, Vitals, and Lab results. Reviewed CBC and CMP. Reordered BMP. Discussed with nephrology  Disposition: Status is: Inpatient Remains inpatient appropriate because: Awaiting clip  heparin  injection 5,000 Units Start: 06/27/23 2200   Family Communication: No one at bedside Level of care: Telemetry Medical   Vitals:   07/03/23 1230 07/03/23 1244 07/03/23 1350 07/03/23 1713  BP:  (!) 185/94 (!) 156/97 (!) 149/84 127/81  Pulse: 74 66 66 71  Resp: (!) 31 20  18   Temp:  98.7 F (37.1 C) 98.3 F (36.8 C) 98.2 F (36.8 C)  TempSrc:   Oral   SpO2: 97% 100% 99% 99%  Weight:      Height:         Author: Charlean Congress, MD 07/03/2023 7:26 PM  Please look on [email or web address] to find out who is on call.

## 2023-07-04 DIAGNOSIS — L03119 Cellulitis of unspecified part of limb: Secondary | ICD-10-CM | POA: Diagnosis not present

## 2023-07-04 DIAGNOSIS — N390 Urinary tract infection, site not specified: Secondary | ICD-10-CM

## 2023-07-04 DIAGNOSIS — A419 Sepsis, unspecified organism: Secondary | ICD-10-CM | POA: Diagnosis not present

## 2023-07-04 DIAGNOSIS — B9620 Unspecified Escherichia coli [E. coli] as the cause of diseases classified elsewhere: Secondary | ICD-10-CM | POA: Diagnosis not present

## 2023-07-04 LAB — CBC
HCT: 28.2 % — ABNORMAL LOW (ref 36.0–46.0)
Hemoglobin: 8.4 g/dL — ABNORMAL LOW (ref 12.0–15.0)
MCH: 29 pg (ref 26.0–34.0)
MCHC: 29.8 g/dL — ABNORMAL LOW (ref 30.0–36.0)
MCV: 97.2 fL (ref 80.0–100.0)
Platelets: 277 K/uL (ref 150–400)
RBC: 2.9 MIL/uL — ABNORMAL LOW (ref 3.87–5.11)
RDW: 14.4 % (ref 11.5–15.5)
WBC: 6.6 K/uL (ref 4.0–10.5)
nRBC: 0 % (ref 0.0–0.2)

## 2023-07-04 LAB — BASIC METABOLIC PANEL WITH GFR
Anion gap: 5 (ref 5–15)
BUN: 53 mg/dL — ABNORMAL HIGH (ref 8–23)
CO2: 27 mmol/L (ref 22–32)
Calcium: 8.8 mg/dL — ABNORMAL LOW (ref 8.9–10.3)
Chloride: 99 mmol/L (ref 98–111)
Creatinine, Ser: 4.15 mg/dL — ABNORMAL HIGH (ref 0.44–1.00)
GFR, Estimated: 11 mL/min — ABNORMAL LOW
Glucose, Bld: 92 mg/dL (ref 70–99)
Potassium: 4.2 mmol/L (ref 3.5–5.1)
Sodium: 131 mmol/L — ABNORMAL LOW (ref 135–145)

## 2023-07-04 LAB — BILIRUBIN, TOTAL: Total Bilirubin: 0.3 mg/dL (ref 0.0–1.2)

## 2023-07-04 LAB — LACTATE DEHYDROGENASE: LDH: 346 U/L — ABNORMAL HIGH (ref 98–192)

## 2023-07-04 LAB — CK: Total CK: 82 U/L (ref 38–234)

## 2023-07-04 MED ORDER — DIPHENHYDRAMINE HCL 25 MG PO CAPS
50.0000 mg | ORAL_CAPSULE | Freq: Three times a day (TID) | ORAL | Status: DC
  Administered 2023-07-04 – 2023-07-07 (×10): 50 mg via ORAL
  Filled 2023-07-04 (×11): qty 2

## 2023-07-04 NOTE — Progress Notes (Addendum)
 [facility] Progress Note   Subjective:   Seen in room.  No issues- remains stable for d/c  Had blood leak on machine x 2, couldn't return blood.  Biomed etc has cleared machine after hours.  Will send a CK and LDH to check for hemolysis. Objective Vitals:   07/04/23 0456 07/04/23 0849 07/04/23 0850 07/04/23 0900  BP: 113/67 123/63  123/63  Pulse: (!) 59 73  60  Resp: 18 18  18   Temp: (!) 97.5 F (36.4 C) 98.4 F (36.9 C)  98 F (36.7 C)  TempSrc: Oral     SpO2: 97% 100% 97% 96%  Weight:      Height:       Physical Exam General: Alert female in NAD Heart: RRR, no murmurs, rubs or gallops Lungs: CTA bilaterally, respirations unlabored on RA Abdomen: Soft, non-distended, +BS Extremities: No edema b/l lower extremities Dialysis Access: R internal jugular [facility]  Additional Objective Labs: Basic Metabolic Panel: Recent Labs  Lab 06/28/23 0717 06/28/23 0718 06/30/23 0500 07/01/23 0816 07/03/23 0601 07/04/23 0523  NA  --    < > 135 134* 129* 131*  K  --    < > 3.5 3.7 4.1 4.2  CL  --    < > 98 99 95* 99  CO2  --    < > 25 25 25 27   GLUCOSE  --    < > 105* 119* 87 92  BUN  --    < > 42* 36* 66* 53*  CREATININE  --    < > 4.50* 3.71* 5.04* 4.15*  CALCIUM   --    < > 8.7* 8.6* 9.0 8.8*  PHOS 5.5*  --  4.2 3.8  --   --    < > = values in this interval not displayed.   Liver Function Tests: Recent Labs  Lab 06/27/23 1629 06/28/23 0718 06/30/23 0500 07/01/23 0816  AST 16 13*  --   --   ALT 11 9  --   --   ALKPHOS 72 78  --   --   BILITOT 0.6 0.7  --   --   PROT 7.0 6.0*  --   --   ALBUMIN  3.4* 2.7* 2.4* 2.6*   No results for input(s): LIPASE, AMYLASE in the last 168 hours. CBC: Recent Labs  Lab 06/27/23 1629 06/27/23 2103 06/29/23 0633 06/30/23 0500 07/03/23 0601 07/03/23 1450 07/04/23 0523  WBC 19.6*   < > 6.9 5.2 5.3 6.6 6.6  NEUTROABS 18.0*  --  4.9  --   --   --   --   HGB 11.8*   < > 9.7* 9.2* 9.2* 9.1* 8.4*  HCT 37.8   < > 31.0*  30.1* 29.5* 29.3* 28.2*  MCV 95.5   < > 96.3 97.1 94.9 93.9 97.2  PLT 258   < > 218 296 289 297 277   < > = values in this interval not displayed.   Blood Culture    Component Value Date/Time   SDES URINE, RANDOM 06/27/2023 1751   SPECREQUEST  06/27/2023 1751    NONE Reflexed from [id number] Performed at [facility], [address], [place], [place] [address]    CULT >=100,000 COLONIES/mL ESCHERICHIA COLI (A) 06/27/2023 1751   REPTSTATUS 06/29/2023 FINAL 06/27/2023 1751    Cardiac Enzymes: No results for input(s): CKTOTAL, CKMB, CKMBINDEX, TROPONINI in the last 168 hours. CBG: No results for input(s): GLUCAP in the last 168 hours. Iron  Studies:  No results for input(s): IRON , TIBC, TRANSFERRIN, FERRITIN in the last 72 hours.  @lablastinr3 @ Studies/Results: No results found. Medications:    amLODipine   10 mg Oral Daily   atorvastatin   20 mg Oral Daily   budesonide -glycopyrrolate -formoterol   2 puff Inhalation BID   carvedilol   12.5 mg Oral BID   cephALEXin   500 mg Oral Q24H   Chlorhexidine  Gluconate Cloth  6 each Topical Daily   Chlorhexidine  Gluconate Cloth  6 each Topical Q0600   dicyclomine   10 mg Oral TID   diphenhydrAMINE   50 mg Oral TID   DULoxetine   30 mg Oral Daily   escitalopram   10 mg Oral Daily   folic acid   1 mg Oral Daily   gabapentin   100 mg Oral QHS   heparin   5,000 Units Subcutaneous Q8H   lamoTRIgine   100 mg Oral Daily   metolazone   2.5 mg Oral Q M,W,F   mirtazapine   15 mg Oral QHS   pantoprazole   40 mg Oral Daily   polyethylene glycol  17 g Oral Daily   QUEtiapine   50 mg Oral BID   sodium chloride  flush  10-40 mL Intracatheter Q12H    Assessment/Plan: # UTI: presented with fever, urine culture + E coli.  Currently on antibiotics per primary team.   # ESRD: Assigned to [facility] but declines to go there.  Informed renal navigator who is looking into other HD unit for patient.  [facility] for access, working well. Continue TTS schedule.  H[facility] to go to [facility]   # Hypertension: Resumed home medication, UF with HD.  Normotensive this AM   # Anemia of ESRD: Hemoglobin 9.2.  Dose ESA weekly.   # Metabolic Bone Disease: Ca and phos ok. Does not appear to be on a binder, not needed at this time.    #hypokalemia: added K bath with dialysis     [name] 07/04/2023, 12:45 PM  [facility]

## 2023-07-04 NOTE — Plan of Care (Signed)
  Problem: Fluid Volume: Goal: Hemodynamic stability will improve Outcome: Progressing   Problem: Clinical Measurements: Goal: Diagnostic test results will improve Outcome: Progressing Goal: Signs and symptoms of infection will decrease Outcome: Progressing   Problem: Respiratory: Goal: Ability to maintain adequate ventilation will improve Outcome: Progressing   Problem: Fluid Volume: Goal: Hemodynamic stability will improve Outcome: Progressing   Problem: Clinical Measurements: Goal: Ability to maintain clinical measurements within normal limits will improve Outcome: Progressing Goal: Will remain free from infection Outcome: Progressing Goal: Diagnostic test results will improve Outcome: Progressing Goal: Respiratory complications will improve Outcome: Progressing Goal: Cardiovascular complication will be avoided Outcome: Progressing   Problem: Coping: Goal: Level of anxiety will decrease Outcome: Progressing   Problem: Nutrition: Goal: Adequate nutrition will be maintained Outcome: Progressing

## 2023-07-04 NOTE — Progress Notes (Addendum)
 Contacted Kim with [facility] out-pt HD this morning to request an update on pt's referral. Burdette Carolin states referral is pending. Navigator requested that pt's referral please be expedited if possible due to pt being stable for d/c once determination is received. Kim to f/u on pt's referral and contact navigator with determination once known. Update provided to attending and nephrologist. Met with pt at bedside as well to provide an update on the above. Will assist as needed.   Lauraine Polite Renal Navigator [phone]  Addendum at 4:10 pm: Spoke to RN at [facility] this afternoon who confirms that there has been no determination made from nephrologist as of yet. Will contact Kim with [facility] out-pt HD to provide phone number for RN CM should a determination be made tomorrow when navigator is out of the office.

## 2023-07-04 NOTE — Progress Notes (Signed)
 PROGRESS NOTE    Tiffany [name]  XBJ:[id number] DOB: 1960/10/02 DOA: 06/27/2023 PCP: Danella Dunn, MD    Brief Narrative:   Tiffany [name] is a 63 y.o. female with past medical history significant for ESRD on HD TTS, chronic diastolic congestive heart failure, schizoaffective disorder, anemia of chronic medical/renal disease, OSA, GERD, neuropathy, anxiety/depression, active tobacco abuse, history of seizure disorder who presented to [facility] ED on 06/27/2023 with complaints of leg swelling.  Last had HD performed 5-6 days prior to admission.  Complaining of some redness to her lower extremities as well as fever, and dysuria.  In the ED, temperature 102.2 F, HR 88, RR 28, BP 155/92, SpO2 95% on room air.  WBC 19.6, hemoglobin 11.8, platelet count 258.  Sodium 130, potassium 3.1, chloride 95, CO2 18, glucose 87, BUN 62, creatinine 6.09.  AST 16, ALT 11, total bilirubin 0.6.  High-sensitivity troponin 19> 21.  Lactic acid 1.1.  COVID/influenza/RSV PCR negative.  Urinalysis with moderate leukocytes, negative nitrite, many bacteria, 11-20 WBCs.  Chest x-ray with no active cardiopulmonary disease process.  Blood cultures and urine culture obtained.  Patient was started on IV vancomycin , ceftriaxone .  Nephrology consulted.  TRH consulted for admission for further evaluation management with concerns for UTI, lower extremity cellulitis.  Assessment & Plan:   Sepsis, POA E. coli urinary tract infection Lower extremity cellulitis Patient presenting to ED with complaints of dysuria.  Temperature elevated 102.2 F, WBC count 19.6.  Urinalysis consistent with UTI.  Urine culture positive for E. coli. -- WBC 19.6>>21.2>>6.6>6.6 -- IV antibiotics now transition to Keflex  500 mg PO daily  ESRD on HD -- Nephrology following, appreciate assistance -- Awaiting outpatient HD clinic  HTN Chronic diastolic congestive heart failure -- Amlodipine  10 mg p.o. daily -- Carvedilol  12.5 mg PO twice daily -- Metolazone   2.5 mg every MWF  Schizoaffective disorder -- Seroquel  50 mg p.o. twice daily   Anxiety/depression -- Lexapro  10 mg p.o. daily -- Cymbalta  30 mg p.o. daily  Seizure disorder -- Lamictal  100 mg p.o. daily  Anemia of chronic medical/renal disease Hemoglobin stable, 8.4. -- Transfuse for hemoglobin less than 7.0  Neuropathy -- Gabapentin   GERD -- Protonix  40 mg p.o. daily  Tobacco use disorder Counseled on need for complete cessation/abstinence  Obesity, class I Body mass index is 34.07 kg/m.    DVT prophylaxis: heparin  injection 5,000 Units Start: 06/27/23 2200    Code Status: Full Code Family Communication: No family present at bedside this morning  Disposition Plan:  Level of care: Telemetry Medical Status is: Inpatient Remains inpatient appropriate because: Awaiting outpatient HD to be set up    Consultants:  Nephrology  Procedures:  None  Antimicrobials:  Vancomycin  6/11 - 6/11 Ceftriaxone  6/11 - 6/11 Cefazolin  6/11 - 6/14 Keflex  6/15>>   Subjective: Patient seen examined bedside, lying in bed.  Complaining of itching.  No other concerns or questions at this time.  Awaiting outpatient HD to be set up.  Discussed with renal navigator this morning.  Patient with no other Tiffany [name] complaints, concerns or questions at this time.  Denies headache, no dizziness, no chest pain, no palpitations, no shortness of breath, no abdominal pain, no fever/chills/night sweats, no nausea/vomiting/diarrhea.  No acute events overnight per nursing staff.  Objective: Vitals:   07/04/23 0456 07/04/23 0849 07/04/23 0850 07/04/23 0900  BP: 113/67 123/63  123/63  Pulse: (!) 59 73  60  Resp: 18 18  18   Temp: (!) 97.5 F (36.4 C) 98.4 F (  36.9 C)  98 F (36.7 C)  TempSrc: Oral     SpO2: 97% 100% 97% 96%  Weight:      Height:        Intake/Output Summary (Last 24 hours) at 07/04/2023 1137 Last data filed at 07/04/2023 0500 Gross per 24 hour  Intake --  Output 1600 ml   Net -1600 ml   Filed Weights   06/30/23 1241 07/03/23 0854 07/03/23 1200  Weight: 82.3 kg 85.4 kg 84.5 kg    Examination:  Physical Exam: GEN: NAD, alert and oriented x 3, chronically ill in appearance HEENT: NCAT, PERRL, EOMI, sclera clear, MMM PULM: CTAB w/o wheezes/crackles, normal respiratory effort, on room air CV: RRR w/o M/G/R, HD catheter noted right chest GI: abd soft, NTND, + BS MSK: no peripheral edema, moves all extremities independently NEURO: No focal neurological deficits PSYCH: normal mood/affect Integumentary: No concerning rashes/lesions/wounds none exposed skin surfaces    Data Reviewed: I have personally reviewed following labs and imaging studies  CBC: Recent Labs  Lab 06/27/23 1629 06/27/23 2103 06/29/23 0633 06/30/23 0500 07/03/23 0601 07/03/23 1450 07/04/23 0523  WBC 19.6*   < > 6.9 5.2 5.3 6.6 6.6  NEUTROABS 18.0*  --  4.9  --   --   --   --   HGB 11.8*   < > 9.7* 9.2* 9.2* 9.1* 8.4*  HCT 37.8   < > 31.0* 30.1* 29.5* 29.3* 28.2*  MCV 95.5   < > 96.3 97.1 94.9 93.9 97.2  PLT 258   < > 218 296 289 297 277   < > = values in this interval not displayed.   Basic Metabolic Panel: Recent Labs  Lab 06/28/23 0717 06/28/23 0718 06/30/23 0500 07/01/23 0816 07/03/23 0601 07/04/23 0523  NA  --  134* 135 134* 129* 131*  K  --  3.0* 3.5 3.7 4.1 4.2  CL  --  99 98 99 95* 99  CO2  --  19* 25 25 25 27   GLUCOSE  --  78 105* 119* 87 92  BUN  --  61* 42* 36* 66* 53*  CREATININE  --  5.91* 4.50* 3.71* 5.04* 4.15*  CALCIUM   --  8.7* 8.7* 8.6* 9.0 8.8*  PHOS 5.5*  --  4.2 3.8  --   --    GFR: Estimated Creatinine Clearance: 13.8 mL/min (A) (by C-G formula based on SCr of 4.15 mg/dL (H)). Liver Function Tests: Recent Labs  Lab 06/27/23 1629 06/28/23 0718 06/30/23 0500 07/01/23 0816  AST 16 13*  --   --   ALT 11 9  --   --   ALKPHOS 72 78  --   --   BILITOT 0.6 0.7  --   --   PROT 7.0 6.0*  --   --   ALBUMIN  3.4* 2.7* 2.4* 2.6*   No  results for input(s): LIPASE, AMYLASE in the last 168 hours. No results for input(s): AMMONIA  in the last 168 hours. Coagulation Profile: Recent Labs  Lab 06/27/23 1629 06/28/23 0718  INR 1.1 1.2   Cardiac Enzymes: No results for input(s): CKTOTAL, CKMB, CKMBINDEX, TROPONINI in the last 168 hours. BNP (last 3 results) No results for input(s): PROBNP in the last 8760 hours. HbA1C: No results for input(s): HGBA1C in the last 72 hours. CBG: No results for input(s): GLUCAP in the last 168 hours. Lipid Profile: No results for input(s): CHOL, HDL, LDLCALC, TRIG, CHOLHDL, LDLDIRECT in the last 72 hours. Thyroid  Function Tests: No results  for input(s): TSH, T4TOTAL, FREET4, T3FREE, THYROIDAB in the last 72 hours. Anemia Panel: No results for input(s): VITAMINB12, FOLATE, FERRITIN, TIBC, IRON , RETICCTPCT in the last 72 hours. Sepsis Labs: Recent Labs  Lab 06/27/23 1624  LATICACIDVEN 1.1    Recent Results (from the past 240 hours)  Blood Culture (routine x 2)     Status: None   Collection Time: 06/27/23  3:52 PM   Specimen: BLOOD RIGHT HAND  Result Value Ref Range Status   Specimen Description BLOOD RIGHT HAND  Final   Special Requests   Final    BOTTLES DRAWN AEROBIC ONLY Blood Culture results may not be optimal due to an inadequate volume of blood received in culture bottles   Culture   Final    NO GROWTH 5 DAYS Performed at [facility] Lab, [address], [place], [place] [address]    Report Status 07/02/2023 FINAL  Final  Blood Culture (routine x 2)     Status: None   Collection Time: 06/27/23  4:02 PM   Specimen: BLOOD RIGHT ARM  Result Value Ref Range Status   Specimen Description BLOOD RIGHT ARM  Final   Special Requests   Final    BOTTLES DRAWN AEROBIC ONLY Blood Culture results may not be optimal due to an inadequate volume of blood received in culture bottles   Culture   Final    NO GROWTH 5 DAYS Performed  at [facility] Lab, [address] [place], [place] [address]    Report Status 07/02/2023 FINAL  Final  Resp panel by RT-PCR (RSV, Flu A&B, Covid) Anterior Nasal Swab     Status: None   Collection Time: 06/27/23  4:11 PM   Specimen: Anterior Nasal Swab  Result Value Ref Range Status   SARS Coronavirus 2 by RT PCR NEGATIVE NEGATIVE Final   Influenza A by PCR NEGATIVE NEGATIVE Final   Influenza B by PCR NEGATIVE NEGATIVE Final    Comment: (NOTE) The Xpert Xpress SARS-CoV-2/FLU/RSV plus assay is intended as an aid in the diagnosis of influenza from Nasopharyngeal swab specimens and should not be used as a sole basis for treatment. Nasal washings and aspirates are unacceptable for Xpert Xpress SARS-CoV-2/FLU/RSV testing.  Fact Sheet for Patients: BloggerCourse.com  Fact Sheet for Healthcare Providers: SeriousBroker.it  This test is not yet approved or cleared by the [place]  FDA and has been authorized for detection and/or diagnosis of SARS-CoV-2 by FDA under an Emergency Use Authorization (EUA). This EUA will remain in effect (meaning this test can be used) for the duration of the COVID-19 declaration under Section 564(b)(1) of the Act, 21 U.S.C. section 360bbb-3(b)(1), unless the authorization is terminated or revoked.     Resp Syncytial Virus by PCR NEGATIVE NEGATIVE Final    Comment: (NOTE) Fact Sheet for Patients: BloggerCourse.com  Fact Sheet for Healthcare Providers: SeriousBroker.it  This test is not yet approved or cleared by the [place]  FDA and has been authorized for detection and/or diagnosis of SARS-CoV-2 by FDA under an Emergency Use Authorization (EUA). This EUA will remain in effect (meaning this test can be used) for the duration of the COVID-19 declaration under Section 564(b)(1) of the Act, 21 U.S.C. section 360bbb-3(b)(1), unless the  authorization is terminated or revoked.  Performed at [facility] Lab, [address], [place], [place] [address]   Urine Culture     Status: Abnormal   Collection Time: 06/27/23  5:51 PM   Specimen: Urine, Random  Result Value  Ref Range Status   Specimen Description URINE, RANDOM  Final   Special Requests   Final    NONE Reflexed from [id number] Performed at [facility] Lab, [address], [place], [place] [address]    Culture >=100,000 COLONIES/mL ESCHERICHIA COLI (A)  Final   Report Status 06/29/2023 FINAL  Final   Organism ID, Bacteria ESCHERICHIA COLI (A)  Final      Susceptibility   Escherichia coli - MIC*    AMPICILLIN >=32 RESISTANT Resistant     CEFAZOLIN  <=4 SENSITIVE Sensitive     CEFEPIME  <=0.12 SENSITIVE Sensitive     CEFTRIAXONE  <=0.25 SENSITIVE Sensitive     CIPROFLOXACIN  1 RESISTANT Resistant     GENTAMICIN <=1 SENSITIVE Sensitive     IMIPENEM <=0.25 SENSITIVE Sensitive     NITROFURANTOIN  <=16 SENSITIVE Sensitive     TRIMETH/SULFA >=320 RESISTANT Resistant     AMPICILLIN/SULBACTAM 16 INTERMEDIATE Intermediate     PIP/TAZO <=4 SENSITIVE Sensitive ug/mL    * >=100,000 COLONIES/mL ESCHERICHIA COLI         Radiology Studies: No results found.      Scheduled Meds:  amLODipine   10 mg Oral Daily   atorvastatin   20 mg Oral Daily   budesonide -glycopyrrolate -formoterol   2 puff Inhalation BID   carvedilol   12.5 mg Oral BID   cephALEXin   500 mg Oral Q24H   Chlorhexidine  Gluconate Cloth  6 each Topical Daily   Chlorhexidine  Gluconate Cloth  6 each Topical Q0600   dicyclomine   10 mg Oral TID   DULoxetine   30 mg Oral Daily   escitalopram   10 mg Oral Daily   folic acid   1 mg Oral Daily   gabapentin   100 mg Oral QHS   heparin   5,000 Units Subcutaneous Q8H   hydrOXYzine   25 mg Oral TID   lamoTRIgine   100 mg Oral Daily   metolazone   2.5 mg Oral Q M,W,F   mirtazapine   15 mg Oral QHS   pantoprazole   40 mg Oral Daily   polyethylene glycol  17 g Oral Daily    QUEtiapine   50 mg Oral BID   sodium chloride  flush  10-40 mL Intracatheter Q12H   Continuous Infusions:   LOS: 7 days    Time spent: 46 minutes spent on 07/04/2023 caring for this patient face-to-face including chart review, ordering labs/tests, documenting, discussion with nursing staff, consultants, updating family and interview/physical exam    Rema [facility] [place], DO [facility] Available via Epic secure chat 7am-7pm After these hours, please refer to coverage provider listed on [email or web address] 07/04/2023, 11:37 AM

## 2023-07-05 DIAGNOSIS — B9620 Unspecified Escherichia coli [E. coli] as the cause of diseases classified elsewhere: Secondary | ICD-10-CM | POA: Diagnosis not present

## 2023-07-05 DIAGNOSIS — A419 Sepsis, unspecified organism: Secondary | ICD-10-CM | POA: Diagnosis not present

## 2023-07-05 DIAGNOSIS — N390 Urinary tract infection, site not specified: Secondary | ICD-10-CM | POA: Diagnosis not present

## 2023-07-05 DIAGNOSIS — L03119 Cellulitis of unspecified part of limb: Secondary | ICD-10-CM | POA: Diagnosis not present

## 2023-07-05 LAB — CBC
HCT: 26.5 % — ABNORMAL LOW (ref 36.0–46.0)
Hemoglobin: 7.9 g/dL — ABNORMAL LOW (ref 12.0–15.0)
MCH: 29.3 pg (ref 26.0–34.0)
MCHC: 29.8 g/dL — ABNORMAL LOW (ref 30.0–36.0)
MCV: 98.1 fL (ref 80.0–100.0)
Platelets: 275 10*3/uL (ref 150–400)
RBC: 2.7 MIL/uL — ABNORMAL LOW (ref 3.87–5.11)
RDW: 14.4 % (ref 11.5–15.5)
WBC: 6.3 10*3/uL (ref 4.0–10.5)
nRBC: 0 % (ref 0.0–0.2)

## 2023-07-05 LAB — HAPTOGLOBIN: Haptoglobin: <10 mg/dL — ABNORMAL LOW (ref 37–355)

## 2023-07-05 LAB — BASIC METABOLIC PANEL WITH GFR
Anion gap: 8 (ref 5–15)
BUN: 68 mg/dL — ABNORMAL HIGH (ref 8–23)
CO2: 26 mmol/L (ref 22–32)
Calcium: 8.8 mg/dL — ABNORMAL LOW (ref 8.9–10.3)
Chloride: 98 mmol/L (ref 98–111)
Creatinine, Ser: 4.87 mg/dL — ABNORMAL HIGH (ref 0.44–1.00)
GFR, Estimated: 9 mL/min — ABNORMAL LOW (ref >60)
Glucose, Bld: 96 mg/dL (ref 70–99)
Potassium: 4.2 mmol/L (ref 3.5–5.1)
Sodium: 132 mmol/L — ABNORMAL LOW (ref 135–145)

## 2023-07-05 IMAGING — CT CT HEAD W/O CM
5 of 7 series · 16 of 47 positions shown, 17 images · non-contrast
Comparison: CT head 05/14/2020. CT cervical spine 10/19/2018

CLINICAL DATA: Lower abdominal pain, nausea, vomiting, diarrhea.
Head trauma.

EXAM:
CT HEAD WITHOUT CONTRAST
CT CERVICAL SPINE WITHOUT CONTRAST
TECHNIQUE: Multidetector CT imaging of the head and cervical spine was
performed following the standard protocol without intravenous
contrast. Multiplanar CT image reconstructions of the cervical spine
were also generated.

[Series 3: head wo · axial · 0.47mm/px · z∈[+1567,+1622]mm · 2 of 33 slices shown, 3 images]
[im 11/33  brain]
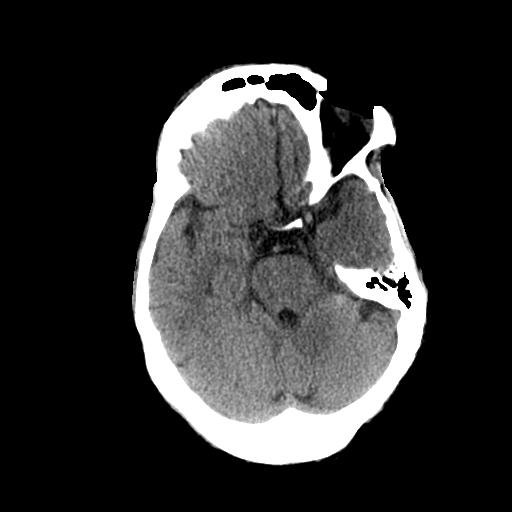
[im 11/33  bone]
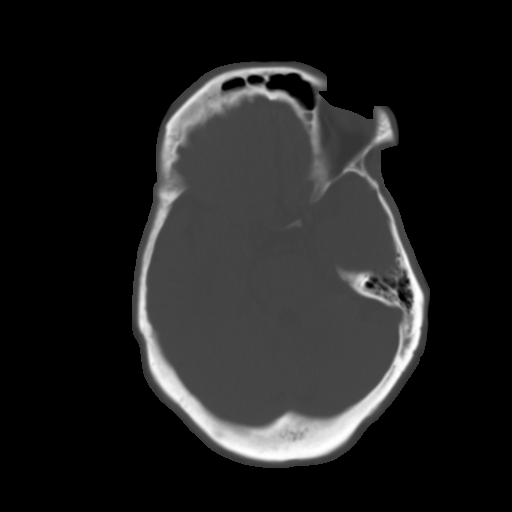
[im 22/33  brain]
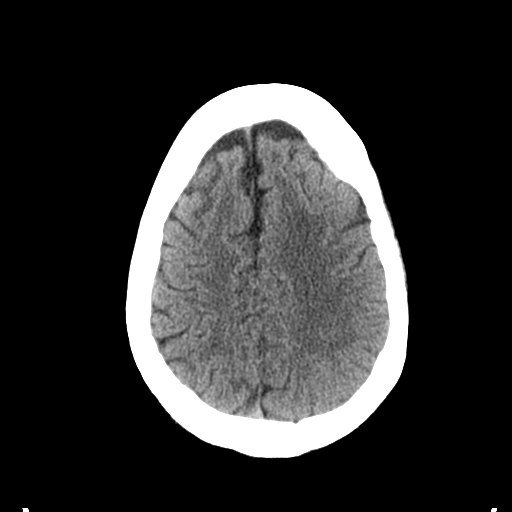

[Series 5: coronal soft tissue · coronal · 0.31mm/px · 3 of 77 slices shown]
[im 26/77  brain]
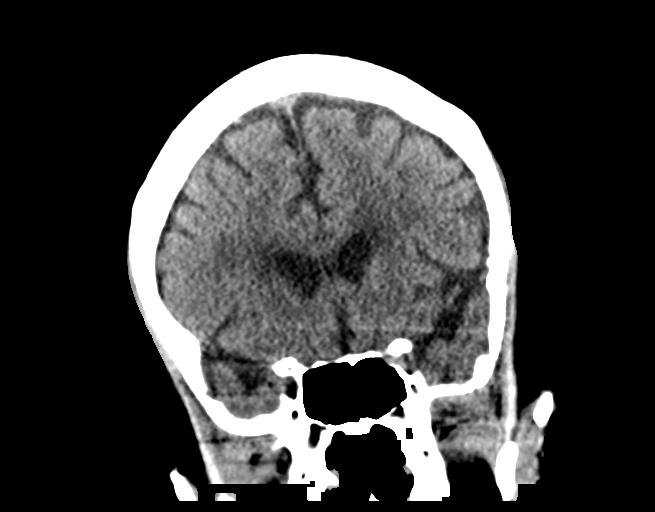
[im 39/77  brain]
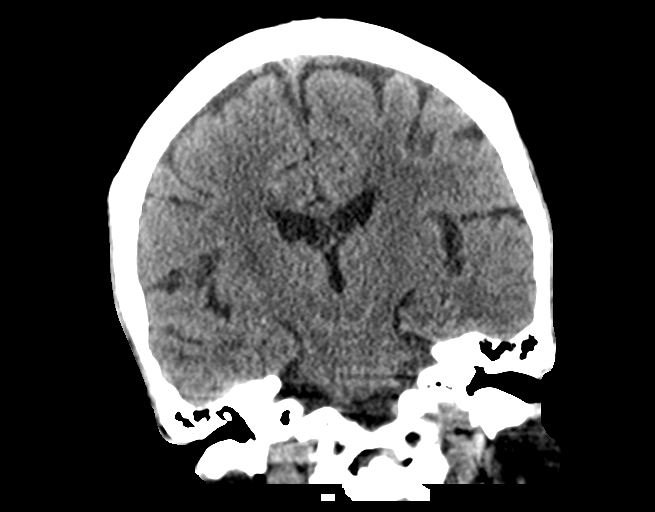
[im 51/77  brain]
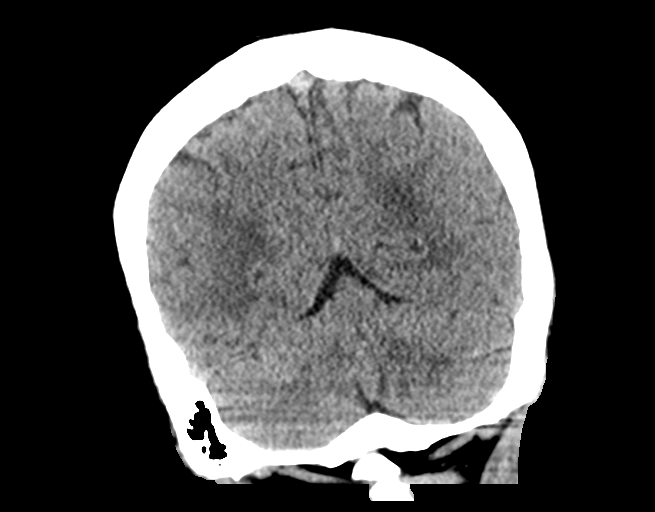

[Series 6: sagittal soft tissue · sagittal · 0.31mm/px · 1 of 59 slices shown]
[im 30/59  brain]
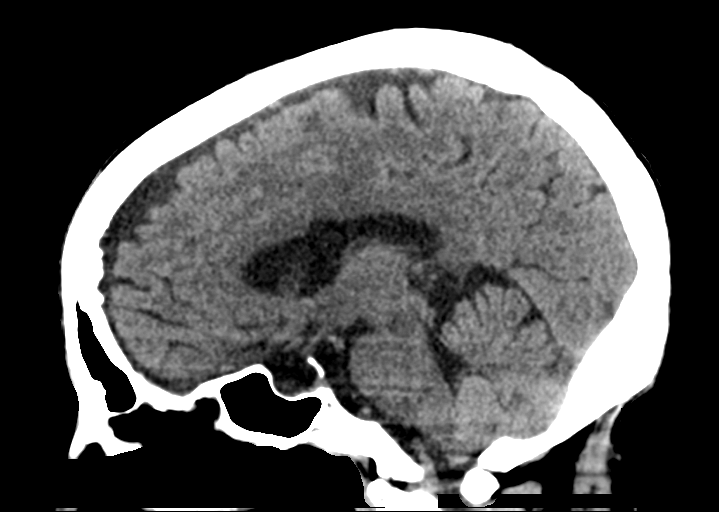

[Series 9: c spine soft · axial · 0.29mm/px · z∈[+1408,+1424]mm · 2 of 84 slices shown]
[im 8/84  brain]
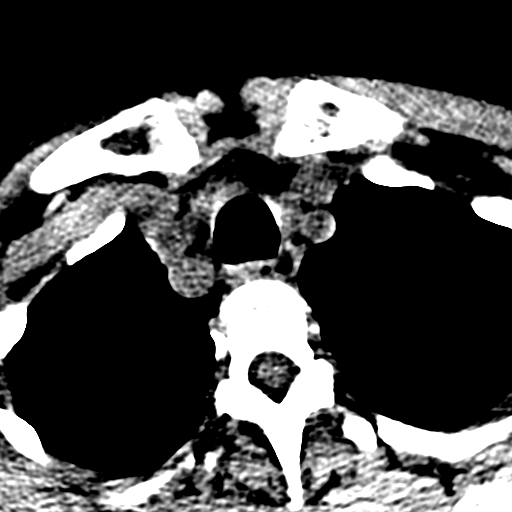
[im 16/84  brain]
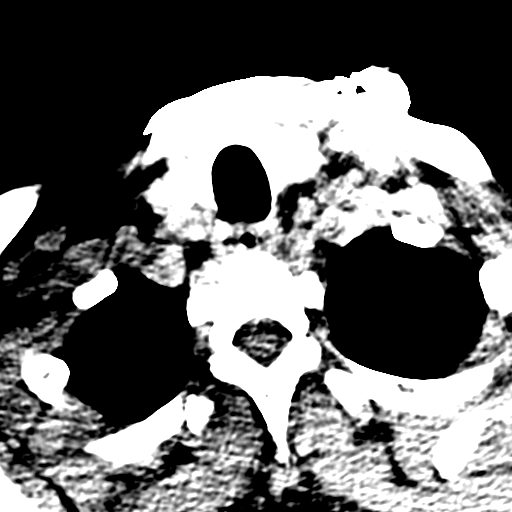

[Series 10: orthogonal bone · axial · 0.25mm/px · z∈[+1373,+1514]mm · 8 of 96 slices shown]
[im 8/96  bone]
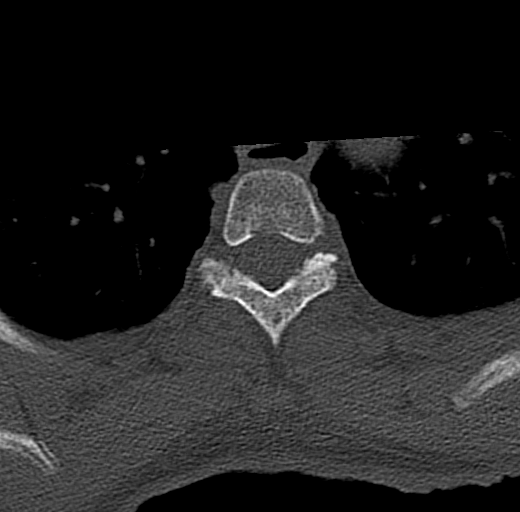
[im 22/96  bone]
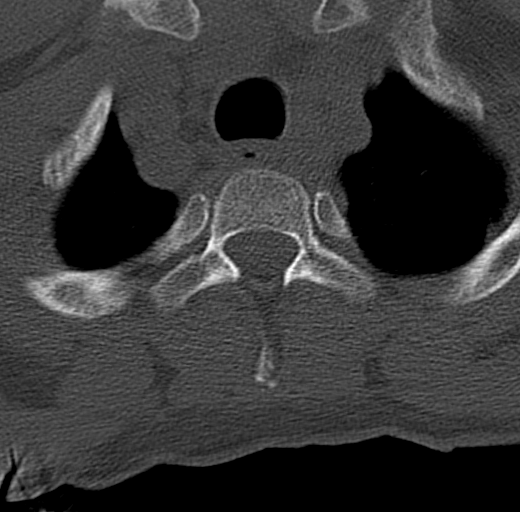
[im 30/96  bone]
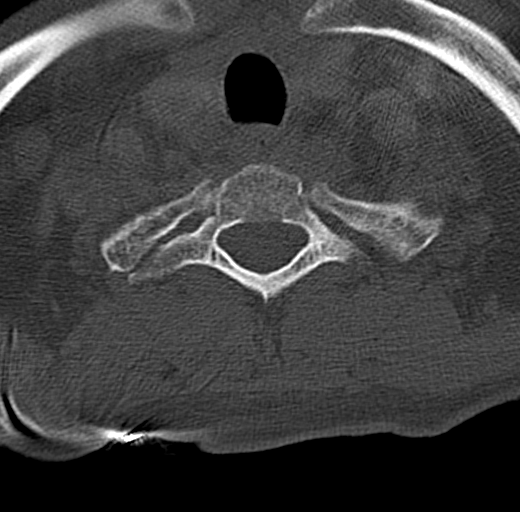
[im 44/96  bone]
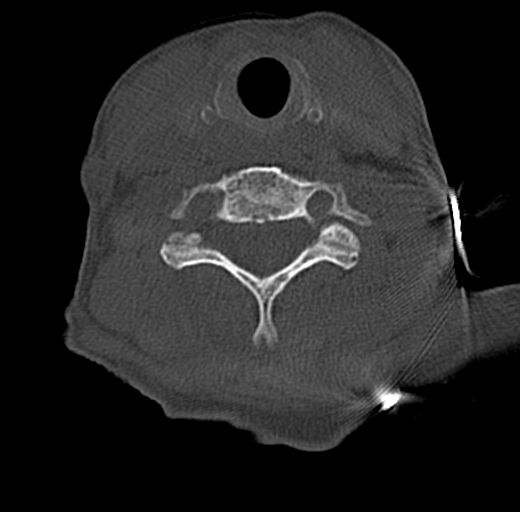
[im 52/96  bone]
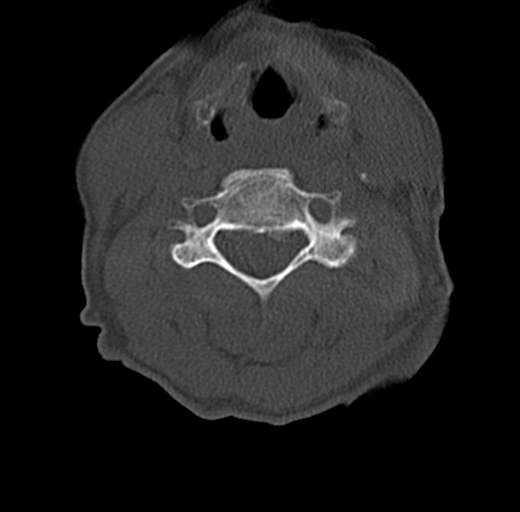
[im 66/96  bone]
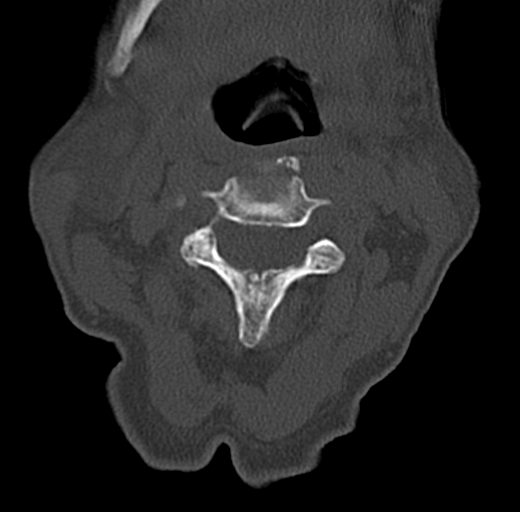
[im 74/96  bone]
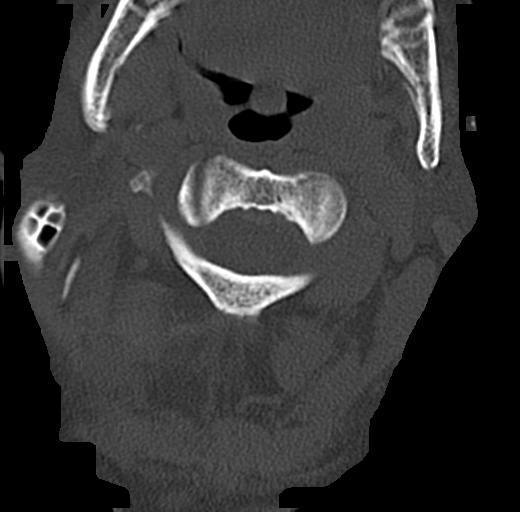
[im 88/96  bone]
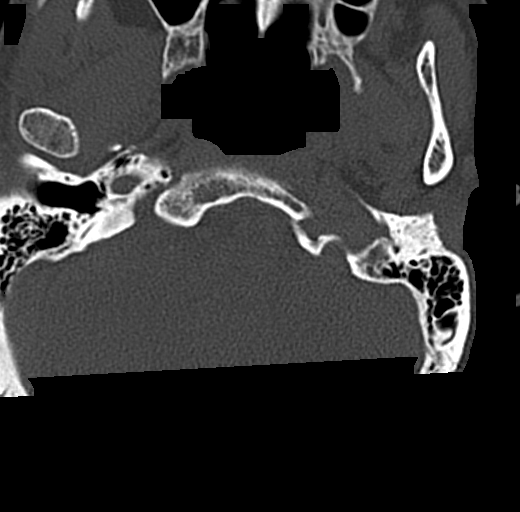

[16 of 47 positions shown; findings below may reference images not displayed]

FINDINGS: CT HEAD FINDINGS

Brain:

Patchy and confluent areas of decreased attenuation are noted
throughout the deep and periventricular white matter of the cerebral
hemispheres bilaterally, compatible with chronic microvascular
ischemic disease.

No evidence of large-territorial acute infarction. No parenchymal
hemorrhage. No mass lesion. No extra-axial collection.

No mass effect or midline shift. No hydrocephalus. Basilar cisterns
are patent.

Empty sella.

Vascular: No hyperdense vessel.

Skull: No acute fracture or focal lesion.

Sinuses/Orbits: Paranasal sinuses and mastoid air cells are clear.
The orbits are unremarkable.

Other: None.

CT CERVICAL SPINE FINDINGS

Alignment: Slight reversal of the normal cervical lordosis centered
at the C3 level likely due to degenerative changes and positioning.

Skull base and vertebrae: Multilevel moderate degenerative changes
of the spine. No acute fracture. No aggressive appearing focal
osseous lesion or focal pathologic process.

Soft tissues and spinal canal: No prevertebral fluid or swelling. No
visible canal hematoma.

Upper chest: Unremarkable.

Other: None.
IMPRESSION: 1. No acute intracranial abnormality.
2. No acute displaced fracture or traumatic listhesis of the
cervical spine.
3. Empty sella. Findings is often a normal anatomic variant but can
be associated with idiopathic intracranial hypertension (pseudotumor
cerebri).

## 2023-07-05 MED ORDER — HEPARIN SODIUM (PORCINE) 1000 UNIT/ML IJ SOLN
INTRAMUSCULAR | Status: AC
  Filled 2023-07-05: qty 4

## 2023-07-05 NOTE — Procedures (Signed)
 Patient seen and examined on Hemodialysis. The procedure was supervised and I have made appropriate changes. BP (!) 135/90   Pulse 64   Temp 98.4 F (36.9 C)   Resp 14   Ht 5' 2 (1.575 m)   Wt 87.5 kg Comment: standing weight  SpO2 100%   BMI 35.28 kg/m   QB 400 mL/ min via TDC, UF goal 2L  Tolerating treatment without complaints at this time.   [name] [facility] [phone] 10:37 AM

## 2023-07-05 NOTE — Progress Notes (Signed)
 [facility] Progress Note   Subjective:   Seen in room.  No issues- remains stable for d/c    Objective Vitals:   07/05/23 0756 07/05/23 0830 07/05/23 0900 07/05/23 1004  BP: 101/61 123/65 108/73 (!) 135/90  Pulse: 66 63 65 64  Resp: 15 19 13 14   Temp:      TempSrc:      SpO2: 100% 100% 99% 100%  Weight:      Height:       Physical Exam General: Alert female in NAD Heart: RRR, no murmurs, rubs or gallops Lungs: CTA bilaterally, respirations unlabored on RA Abdomen: Soft, non-distended, +BS Extremities: No edema b/l lower extremities Dialysis Access: R internal jugular [facility]  Additional Objective Labs: Basic Metabolic Panel: Recent Labs  Lab 06/30/23 0500 07/01/23 0816 07/03/23 0601 07/04/23 0523 07/05/23 0530  NA 135 134* 129* 131* 132*  K 3.5 3.7 4.1 4.2 4.2  CL 98 99 95* 99 98  CO2 25 25 25 27 26   GLUCOSE 105* 119* 87 92 96  BUN 42* 36* 66* 53* 68*  CREATININE 4.50* 3.71* 5.04* 4.15* 4.87*  CALCIUM  8.7* 8.6* 9.0 8.8* 8.8*  PHOS 4.2 3.8  --   --   --    Liver Function Tests: Recent Labs  Lab 06/30/23 0500 07/01/23 0816 07/04/23 1356  BILITOT  --   --  0.3  ALBUMIN  2.4* 2.6*  --    No results for input(s): LIPASE, AMYLASE in the last 168 hours. CBC: Recent Labs  Lab 06/29/23 0633 06/30/23 0500 07/03/23 0601 07/03/23 1450 07/04/23 0523 07/05/23 0530  WBC 6.9 5.2 5.3 6.6 6.6 6.3  NEUTROABS 4.9  --   --   --   --   --   HGB 9.7* 9.2* 9.2* 9.1* 8.4* 7.9*  HCT 31.0* 30.1* 29.5* 29.3* 28.2* 26.5*  MCV 96.3 97.1 94.9 93.9 97.2 98.1  PLT 218 296 289 297 277 275   Blood Culture    Component Value Date/Time   SDES URINE, RANDOM 06/27/2023 1751   SPECREQUEST  06/27/2023 1751    NONE Reflexed from [id number] Performed at [facility], [address], [place], [place] [address]    CULT >=100,000 COLONIES/mL ESCHERICHIA COLI (A) 06/27/2023 1751   REPTSTATUS 06/29/2023 FINAL 06/27/2023 1751    Cardiac Enzymes: Recent Labs  Lab  07/04/23 1356  CKTOTAL 82   CBG: No results for input(s): GLUCAP in the last 168 hours. Iron  Studies:  No results for input(s): IRON , TIBC, TRANSFERRIN, FERRITIN in the last 72 hours.  @lablastinr3 @ Studies/Results: No results found. Medications:    amLODipine   10 mg Oral Daily   atorvastatin   20 mg Oral Daily   budesonide -glycopyrrolate -formoterol   2 puff Inhalation BID   carvedilol   12.5 mg Oral BID   Chlorhexidine  Gluconate Cloth  6 each Topical Daily   Chlorhexidine  Gluconate Cloth  6 each Topical Q0600   dicyclomine   10 mg Oral TID   diphenhydrAMINE   50 mg Oral TID   DULoxetine   30 mg Oral Daily   escitalopram   10 mg Oral Daily   folic acid   1 mg Oral Daily   gabapentin   100 mg Oral QHS   heparin   5,000 Units Subcutaneous Q8H   lamoTRIgine   100 mg Oral Daily   metolazone   2.5 mg Oral Q M,W,F   mirtazapine   15 mg Oral QHS   pantoprazole   40 mg Oral Daily   polyethylene glycol  17 g Oral Daily   QUEtiapine   50 mg Oral BID   sodium chloride  flush  10-40 mL Intracatheter Q12H    Assessment/Plan: # UTI: presented with fever, urine culture + E coli.  Currently on antibiotics per primary team.   # ESRD: Assigned to [facility] but declines to go there.  Informed renal navigator who is looking into other HD unit for patient.  [facility] for access, working well. Continue TTS schedule. Hoping to go to [facility]  HD today   # Hypertension: Resumed home medication, UF with HD.  Normotensive this AM   # Anemia of ESRD: Hemoglobin 9.2.  Dose ESA weekly.   # Metabolic Bone Disease: Ca and phos ok. Does not appear to be on a binder, not needed at this time.    #hypokalemia: added K bath with dialysis     [name] 07/05/2023, 10:36 AM  [facility]

## 2023-07-05 NOTE — Progress Notes (Signed)
 PROGRESS NOTE    Tiffany [name]  WUJ:[id number] DOB: 09-03-1960 DOA: 06/27/2023 PCP: Danella Dunn, MD    Brief Narrative:   Tiffany [name] is a 63 y.o. female with past medical history significant for ESRD on HD TTS, chronic diastolic congestive heart failure, schizoaffective disorder, anemia of chronic medical/renal disease, OSA, GERD, neuropathy, anxiety/depression, active tobacco abuse, history of seizure disorder who presented to [facility] ED on 06/27/2023 with complaints of leg swelling.  Last had HD performed 5-6 days prior to admission.  Complaining of some redness to her lower extremities as well as fever, and dysuria.  In the ED, temperature 102.2 F, HR 88, RR 28, BP 155/92, SpO2 95% on room air.  WBC 19.6, hemoglobin 11.8, platelet count 258.  Sodium 130, potassium 3.1, chloride 95, CO2 18, glucose 87, BUN 62, creatinine 6.09.  AST 16, ALT 11, total bilirubin 0.6.  High-sensitivity troponin 19> 21.  Lactic acid 1.1.  COVID/influenza/RSV PCR negative.  Urinalysis with moderate leukocytes, negative nitrite, many bacteria, 11-20 WBCs.  Chest x-ray with no active cardiopulmonary disease process.  Blood cultures and urine culture obtained.  Patient was started on IV vancomycin , ceftriaxone .  Nephrology consulted.  TRH consulted for admission for further evaluation management with concerns for UTI, lower extremity cellulitis.  Assessment & Plan:   Sepsis, POA E. coli urinary tract infection Lower extremity cellulitis Patient presenting to ED with complaints of dysuria.  Temperature elevated 102.2 F, WBC count 19.6.  Urinalysis consistent with UTI.  Urine culture positive for E. coli. -- WBC 19.6>>21.2>>6.6>6.6>6.3 -- IV antibiotics now transitioned to Keflex  500 mg PO daily; to complete antibiotic course today  ESRD on HD -- Nephrology following, appreciate assistance -- Awaiting outpatient HD clinic  HTN Chronic diastolic congestive heart failure -- Amlodipine  10 mg p.o. daily --  Carvedilol  12.5 mg PO twice daily -- Metolazone  2.5 mg every MWF  Schizoaffective disorder -- Seroquel  50 mg p.o. twice daily   Anxiety/depression -- Lexapro  10 mg p.o. daily -- Cymbalta  30 mg p.o. daily  Seizure disorder -- Lamictal  100 mg p.o. daily  Anemia of chronic medical/renal disease Hemoglobin stable, 8.4. -- Transfuse for hemoglobin less than 7.0  Neuropathy -- Gabapentin  100 mg PO qHS  GERD -- Protonix  40 mg p.o. daily  Tobacco use disorder Counseled on need for complete cessation/abstinence  Obesity, class I Body mass index is 35.28 kg/m.    DVT prophylaxis: heparin  injection 5,000 Units Start: 06/27/23 2200    Code Status: Full Code Family Communication: No family present at bedside this morning  Disposition Plan:  Level of care: Telemetry Medical Status is: Inpatient Remains inpatient appropriate because: Awaiting outpatient HD to be set up    Consultants:  Nephrology  Procedures:  None  Antimicrobials:  Vancomycin  6/11 - 6/11 Ceftriaxone  6/11 - 6/11 Cefazolin  6/11 - 6/14 Keflex  6/15>>   Subjective: Patient seen examined bedside, currently undergoing hemodialysis.  No specific complaints this morning. Awaiting outpatient HD to be set up.  Denies headache, no dizziness, no chest pain, no palpitations, no shortness of breath, no abdominal pain, no fever/chills/night sweats, no nausea/vomiting/diarrhea.  No acute events overnight per nursing staff.  Objective: Vitals:   07/05/23 0756 07/05/23 0830 07/05/23 0900 07/05/23 1004  BP: 101/61 123/65 108/73 (!) 135/90  Pulse: 66 63 65 64  Resp: 15 19 13 14   Temp:      TempSrc:      SpO2: 100% 100% 99% 100%  Weight:      Height:  Intake/Output Summary (Last 24 hours) at 07/05/2023 1012 Last data filed at 07/04/2023 1800 Gross per 24 hour  Intake 200 ml  Output --  Net 200 ml   Filed Weights   07/03/23 0854 07/03/23 1200 07/05/23 0750  Weight: 85.4 kg 84.5 kg 87.5 kg     Examination:  Physical Exam: GEN: NAD, alert and oriented x 3, chronically ill in appearance HEENT: NCAT, PERRL, EOMI, sclera clear, MMM PULM: CTAB w/o wheezes/crackles, normal respiratory effort, on room air CV: RRR w/o M/G/R, HD catheter noted right chest GI: abd soft, NTND, + BS MSK: no peripheral edema, moves all extremities independently NEURO: No focal neurological deficits PSYCH: normal mood/affect Integumentary: No concerning rashes/lesions/wounds none exposed skin surfaces    Data Reviewed: I have personally reviewed following labs and imaging studies  CBC: Recent Labs  Lab 06/29/23 0633 06/30/23 0500 07/03/23 0601 07/03/23 1450 07/04/23 0523 07/05/23 0530  WBC 6.9 5.2 5.3 6.6 6.6 6.3  NEUTROABS 4.9  --   --   --   --   --   HGB 9.7* 9.2* 9.2* 9.1* 8.4* 7.9*  HCT 31.0* 30.1* 29.5* 29.3* 28.2* 26.5*  MCV 96.3 97.1 94.9 93.9 97.2 98.1  PLT 218 296 289 297 277 275   Basic Metabolic Panel: Recent Labs  Lab 06/30/23 0500 07/01/23 0816 07/03/23 0601 07/04/23 0523 07/05/23 0530  NA 135 134* 129* 131* 132*  K 3.5 3.7 4.1 4.2 4.2  CL 98 99 95* 99 98  CO2 25 25 25 27 26   GLUCOSE 105* 119* 87 92 96  BUN 42* 36* 66* 53* 68*  CREATININE 4.50* 3.71* 5.04* 4.15* 4.87*  CALCIUM  8.7* 8.6* 9.0 8.8* 8.8*  PHOS 4.2 3.8  --   --   --    GFR: Estimated Creatinine Clearance: 12 mL/min (A) (by C-G formula based on SCr of 4.87 mg/dL (H)). Liver Function Tests: Recent Labs  Lab 06/30/23 0500 07/01/23 0816 07/04/23 1356  BILITOT  --   --  0.3  ALBUMIN  2.4* 2.6*  --    No results for input(s): LIPASE, AMYLASE in the last 168 hours. No results for input(s): AMMONIA  in the last 168 hours. Coagulation Profile: No results for input(s): INR, PROTIME in the last 168 hours.  Cardiac Enzymes: Recent Labs  Lab 07/04/23 1356  CKTOTAL 82   BNP (last 3 results) No results for input(s): PROBNP in the last 8760 hours. HbA1C: No results for input(s):  HGBA1C in the last 72 hours. CBG: No results for input(s): GLUCAP in the last 168 hours. Lipid Profile: No results for input(s): CHOL, HDL, LDLCALC, TRIG, CHOLHDL, LDLDIRECT in the last 72 hours. Thyroid  Function Tests: No results for input(s): TSH, T4TOTAL, FREET4, T3FREE, THYROIDAB in the last 72 hours. Anemia Panel: No results for input(s): VITAMINB12, FOLATE, FERRITIN, TIBC, IRON , RETICCTPCT in the last 72 hours. Sepsis Labs: No results for input(s): PROCALCITON, LATICACIDVEN in the last 168 hours.   Recent Results (from the past 240 hours)  Blood Culture (routine x 2)     Status: None   Collection Time: 06/27/23  3:52 PM   Specimen: BLOOD RIGHT HAND  Result Value Ref Range Status   Specimen Description BLOOD RIGHT HAND  Final   Special Requests   Final    BOTTLES DRAWN AEROBIC ONLY Blood Culture results may not be optimal due to an inadequate volume of blood received in culture bottles   Culture   Final    NO GROWTH 5 DAYS Performed at [facility], [address], [place], [place] [address]    Report Status 07/02/2023 FINAL  Final  Blood Culture (routine x 2)     Status: None   Collection Time: 06/27/23  4:02 PM   Specimen: BLOOD RIGHT ARM  Result Value Ref Range Status   Specimen Description BLOOD RIGHT ARM  Final   Special Requests   Final    BOTTLES DRAWN AEROBIC ONLY Blood Culture results may not be optimal due to an inadequate volume of blood received in culture bottles   Culture   Final    NO GROWTH 5 DAYS Performed at [facility], [address] [place], [place] [address]    Report Status 07/02/2023 FINAL  Final  Resp panel by RT-PCR (RSV, Flu A&B, Covid) Anterior Nasal Swab     Status: None   Collection Time: 06/27/23  4:11 PM   Specimen: Anterior Nasal Swab  Result Value Ref Range Status   SARS Coronavirus 2 by RT PCR NEGATIVE NEGATIVE Final   Influenza A by PCR NEGATIVE NEGATIVE Final   Influenza B by  PCR NEGATIVE NEGATIVE Final    Comment: (NOTE) The Xpert Xpress SARS-CoV-2/FLU/RSV plus assay is intended as an aid in the diagnosis of influenza from Nasopharyngeal swab specimens and should not be used as a sole basis for treatment. Nasal washings and aspirates are unacceptable for Xpert Xpress SARS-CoV-2/FLU/RSV testing.  Fact Sheet for Patients: BloggerCourse.com  Fact Sheet for Healthcare Providers: SeriousBroker.it  This test is not yet approved or cleared by the [place]  FDA and has been authorized for detection and/or diagnosis of SARS-CoV-2 by FDA under an Emergency Use Authorization (EUA). This EUA will remain in effect (meaning this test can be used) for the duration of the COVID-19 declaration under Section 564(b)(1) of the Act, 21 U.S.C. section 360bbb-3(b)(1), unless the authorization is terminated or revoked.     Resp Syncytial Virus by PCR NEGATIVE NEGATIVE Final    Comment: (NOTE) Fact Sheet for Patients: BloggerCourse.com  Fact Sheet for Healthcare Providers: SeriousBroker.it  This test is not yet approved or cleared by the [place]  FDA and has been authorized for detection and/or diagnosis of SARS-CoV-2 by FDA under an Emergency Use Authorization (EUA). This EUA will remain in effect (meaning this test can be used) for the duration of the COVID-19 declaration under Section 564(b)(1) of the Act, 21 U.S.C. section 360bbb-3(b)(1), unless the authorization is terminated or revoked.  Performed at [facility], [address], [place], [place] [address]   Urine Culture     Status: Abnormal   Collection Time: 06/27/23  5:51 PM   Specimen: Urine, Random  Result Value Ref Range Status   Specimen Description URINE, RANDOM  Final   Special Requests   Final    NONE Reflexed from [id number] Performed at [facility], [address],  [place], [place] [address]    Culture >=100,000 COLONIES/mL ESCHERICHIA COLI (A)  Final   Report Status 06/29/2023 FINAL  Final   Organism ID, Bacteria ESCHERICHIA COLI (A)  Final      Susceptibility   Escherichia coli - MIC*    AMPICILLIN >=32 RESISTANT Resistant     CEFAZOLIN  <=4 SENSITIVE Sensitive     CEFEPIME  <=0.12 SENSITIVE Sensitive     CEFTRIAXONE  <=0.25 SENSITIVE Sensitive     CIPROFLOXACIN  1 RESISTANT Resistant     GENTAMICIN <=1 SENSITIVE Sensitive     IMIPENEM <=0.25 SENSITIVE Sensitive     NITROFURANTOIN  <=16  SENSITIVE Sensitive     TRIMETH/SULFA >=320 RESISTANT Resistant     AMPICILLIN/SULBACTAM 16 INTERMEDIATE Intermediate     PIP/TAZO <=4 SENSITIVE Sensitive ug/mL    * >=100,000 COLONIES/mL ESCHERICHIA COLI         Radiology Studies: No results found.      Scheduled Meds:  amLODipine   10 mg Oral Daily   atorvastatin   20 mg Oral Daily   budesonide -glycopyrrolate -formoterol   2 puff Inhalation BID   carvedilol   12.5 mg Oral BID   Chlorhexidine  Gluconate Cloth  6 each Topical Daily   Chlorhexidine  Gluconate Cloth  6 each Topical Q0600   dicyclomine   10 mg Oral TID   diphenhydrAMINE   50 mg Oral TID   DULoxetine   30 mg Oral Daily   escitalopram   10 mg Oral Daily   folic acid   1 mg Oral Daily   gabapentin   100 mg Oral QHS   heparin   5,000 Units Subcutaneous Q8H   lamoTRIgine   100 mg Oral Daily   metolazone   2.5 mg Oral Q M,W,F   mirtazapine   15 mg Oral QHS   pantoprazole   40 mg Oral Daily   polyethylene glycol  17 g Oral Daily   QUEtiapine   50 mg Oral BID   sodium chloride  flush  10-40 mL Intracatheter Q12H   Continuous Infusions:   LOS: 8 days    Time spent: 46 minutes spent on 07/05/2023 caring for this patient face-to-face including chart review, ordering labs/tests, documenting, discussion with nursing staff, consultants, updating family and interview/physical exam    Rema Care [place], DO [facility] Available via Epic secure chat  7am-7pm After these hours, please refer to coverage provider listed on [email or web address] 07/05/2023, 10:12 AM

## 2023-07-05 NOTE — Plan of Care (Signed)
  Problem: Fluid Volume: Goal: Hemodynamic stability will improve Outcome: Progressing   Problem: Clinical Measurements: Goal: Diagnostic test results will improve Outcome: Progressing Goal: Signs and symptoms of infection will decrease Outcome: Progressing   Problem: Respiratory: Goal: Ability to maintain adequate ventilation will improve Outcome: Progressing   Problem: Fluid Volume: Goal: Hemodynamic stability will improve Outcome: Progressing   Problem: Clinical Measurements: Goal: Diagnostic test results will improve Outcome: Progressing Goal: Signs and symptoms of infection will decrease Outcome: Progressing   Problem: Respiratory: Goal: Ability to maintain adequate ventilation will improve Outcome: Progressing   Problem: Education: Goal: Knowledge of General Education information will improve Description: Including pain rating scale, medication(s)/side effects and non-pharmacologic comfort measures Outcome: Progressing   Problem: Health Behavior/Discharge Planning: Goal: Ability to manage health-related needs will improve Outcome: Progressing   Problem: Clinical Measurements: Goal: Ability to maintain clinical measurements within normal limits will improve Outcome: Progressing Goal: Will remain free from infection Outcome: Progressing Goal: Diagnostic test results will improve Outcome: Progressing Goal: Respiratory complications will improve Outcome: Progressing Goal: Cardiovascular complication will be avoided Outcome: Progressing   Problem: Activity: Goal: Risk for activity intolerance will decrease Outcome: Progressing   Problem: Nutrition: Goal: Adequate nutrition will be maintained Outcome: Progressing   Problem: Coping: Goal: Level of anxiety will decrease Outcome: Progressing   Problem: Elimination: Goal: Will not experience complications related to bowel motility Outcome: Progressing Goal: Will not experience complications related to  urinary retention Outcome: Progressing   Problem: Pain Managment: Goal: General experience of comfort will improve and/or be controlled Outcome: Progressing   Problem: Safety: Goal: Ability to remain free from injury will improve Outcome: Progressing   Problem: Skin Integrity: Goal: Risk for impaired skin integrity will decrease Outcome: Progressing

## 2023-07-06 DIAGNOSIS — A419 Sepsis, unspecified organism: Secondary | ICD-10-CM | POA: Diagnosis not present

## 2023-07-06 LAB — URINALYSIS, ROUTINE W REFLEX MICROSCOPIC
Bilirubin Urine: NEGATIVE
Glucose, UA: NEGATIVE mg/dL
Hgb urine dipstick: NEGATIVE
Ketones, ur: NEGATIVE mg/dL
Leukocytes,Ua: NEGATIVE
Nitrite: NEGATIVE
Protein, ur: NEGATIVE mg/dL
Specific Gravity, Urine: 1.006 (ref 1.005–1.030)
pH: 7 (ref 5.0–8.0)

## 2023-07-06 LAB — WET PREP, GENITAL
Clue Cells Wet Prep HPF POC: NONE SEEN
Sperm: NONE SEEN
Trich, Wet Prep: NONE SEEN
WBC, Wet Prep HPF POC: <10 (ref <10)
Yeast Wet Prep HPF POC: NONE SEEN

## 2023-07-06 LAB — HEPATITIS B SURFACE ANTIGEN: Hepatitis B Surface Ag: NONREACTIVE

## 2023-07-06 LAB — HEPATITIS PANEL, ACUTE
HCV Ab: NONREACTIVE
Hep A IgM: NONREACTIVE
Hep B C IgM: NONREACTIVE
Hepatitis B Surface Ag: NONREACTIVE

## 2023-07-06 IMAGING — US US PELVIS COMPLETE WITH TRANSVAGINAL
1 series · 13 of 25 positions shown · non-contrast
Comparison: CT 07/23/2020, 10/30/2019

CLINICAL DATA: Evaluate left adnexal cyst. History of hysterectomy.



[Series 1: us pelvis complete with transvaginal · 13 of 38 slices shown]
[im 1/38]
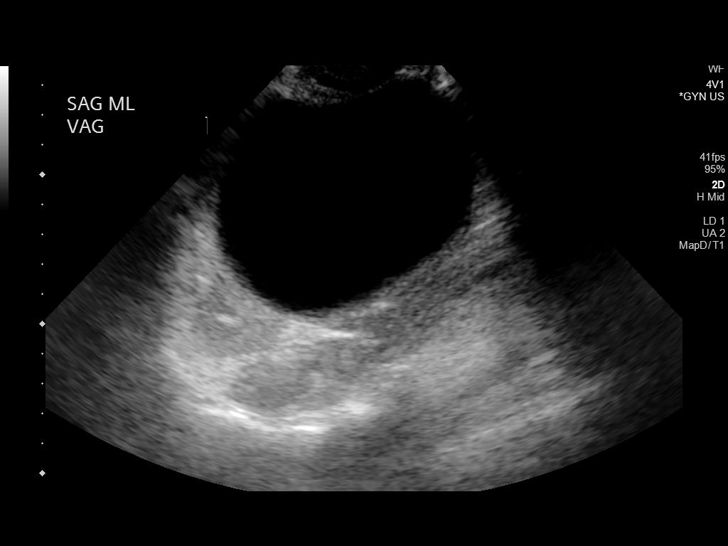
[im 4/38]
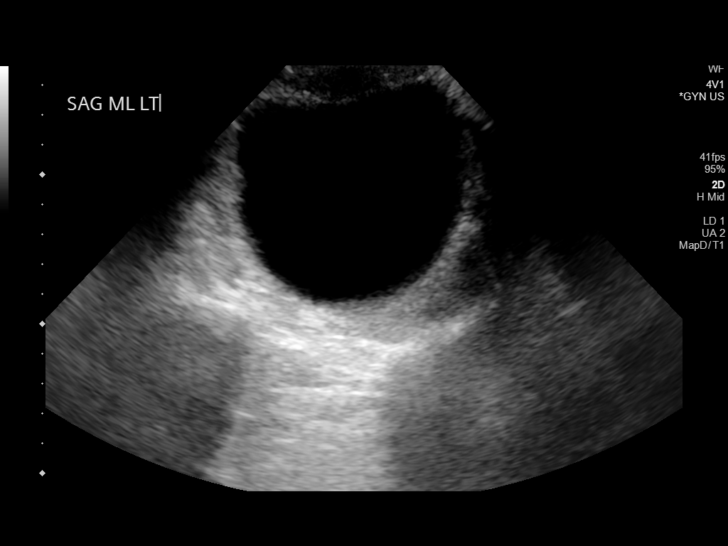
[im 7/38]
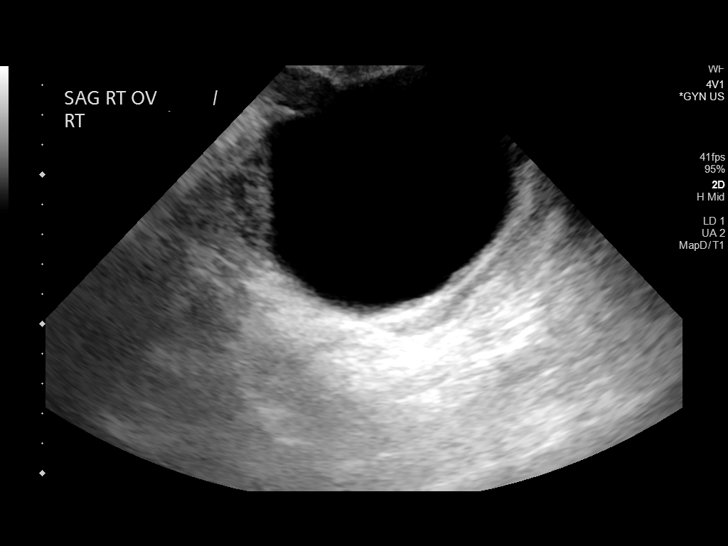
[im 10/38]
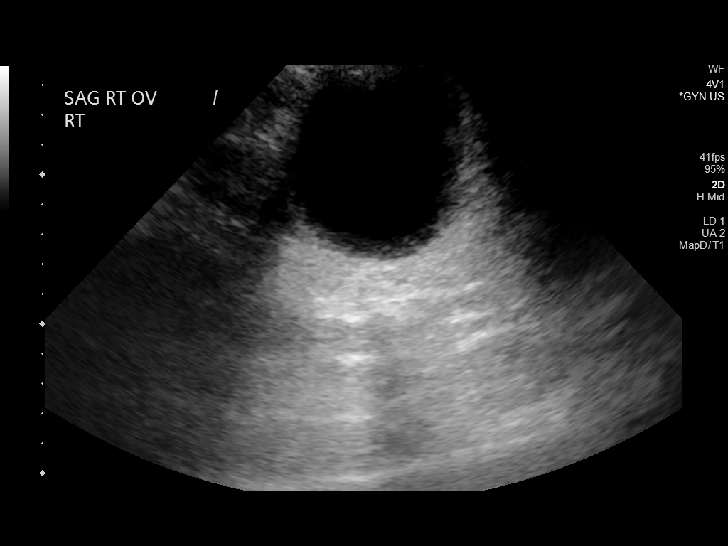
[im 13/38]
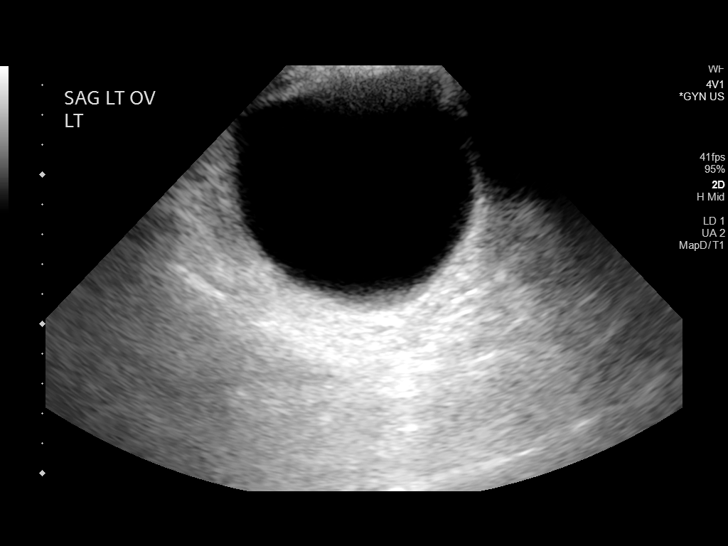
[im 16/38]
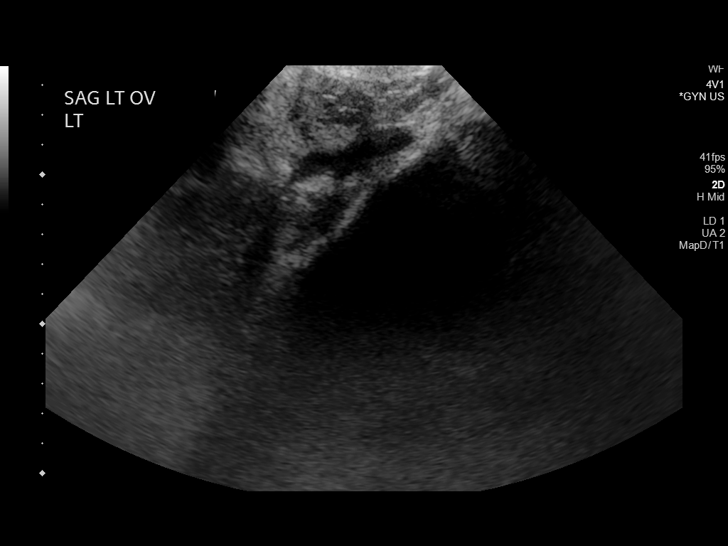
[im 19/38]
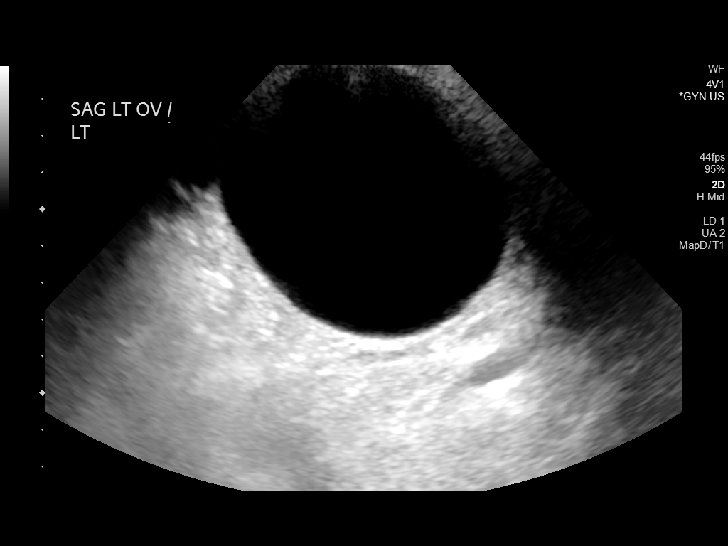
[im 22/38]
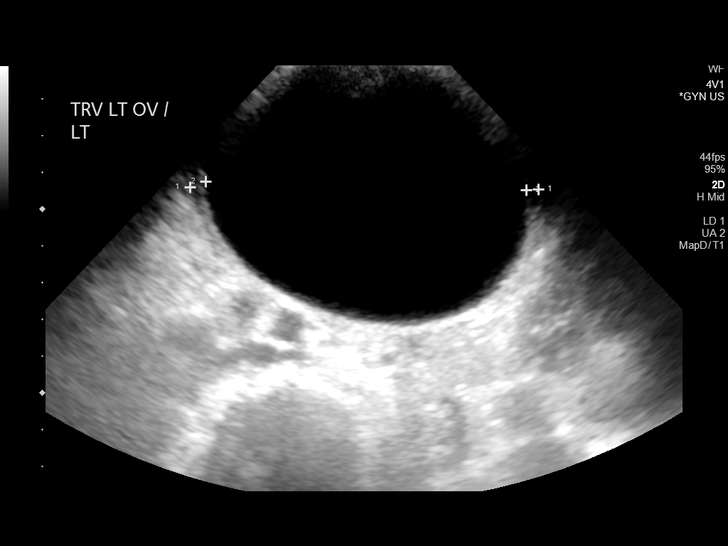
[im 25/38]
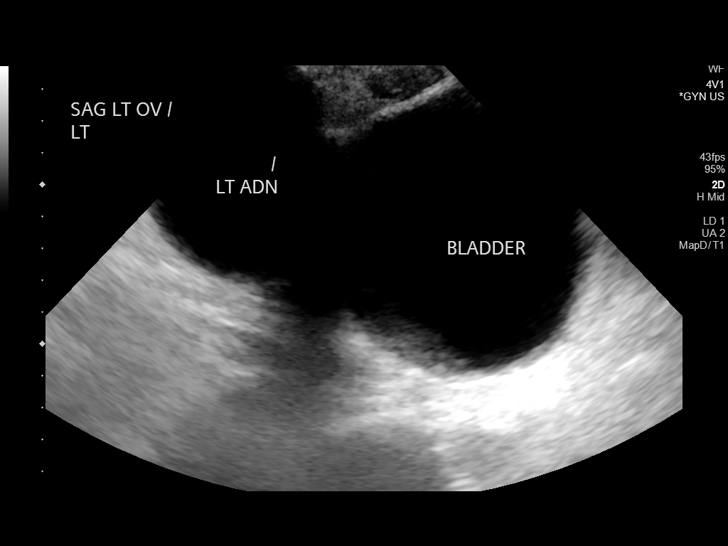
[im 28/38]
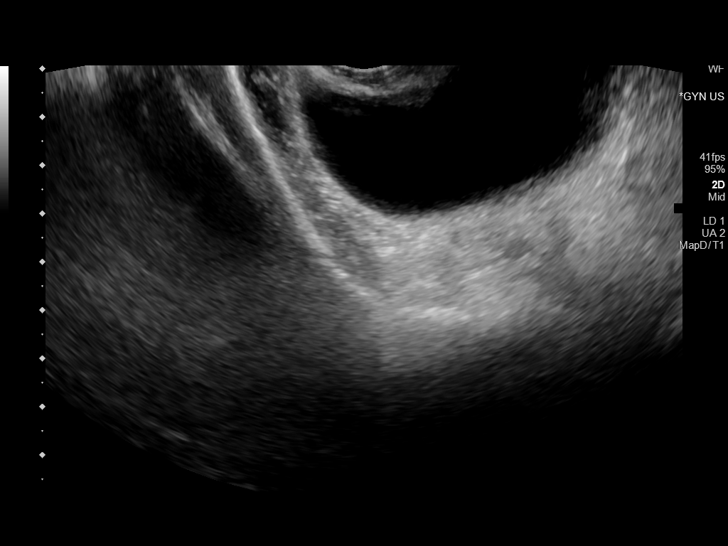
[im 31/38]
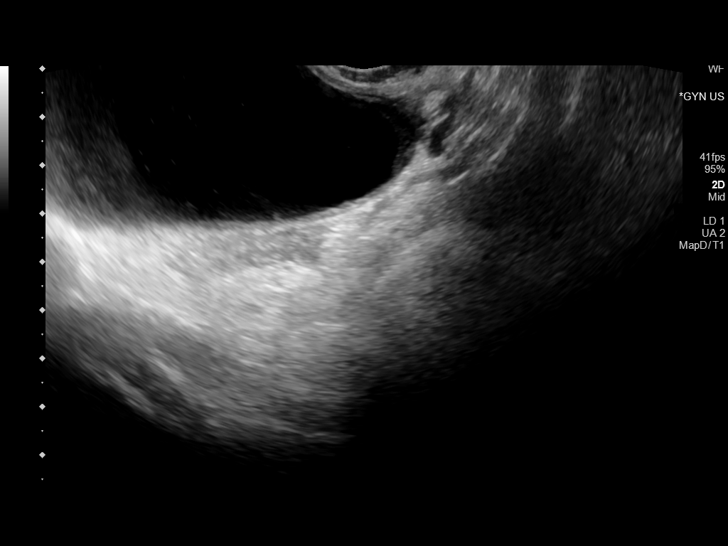
[im 34/38]
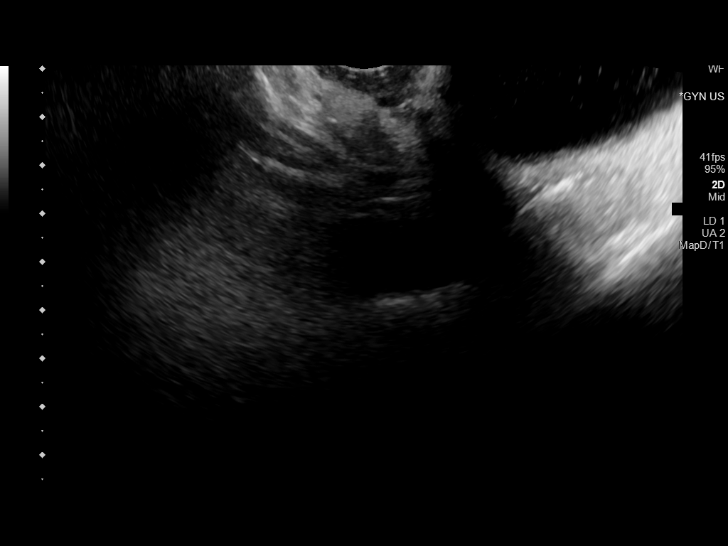
[im 38/38]
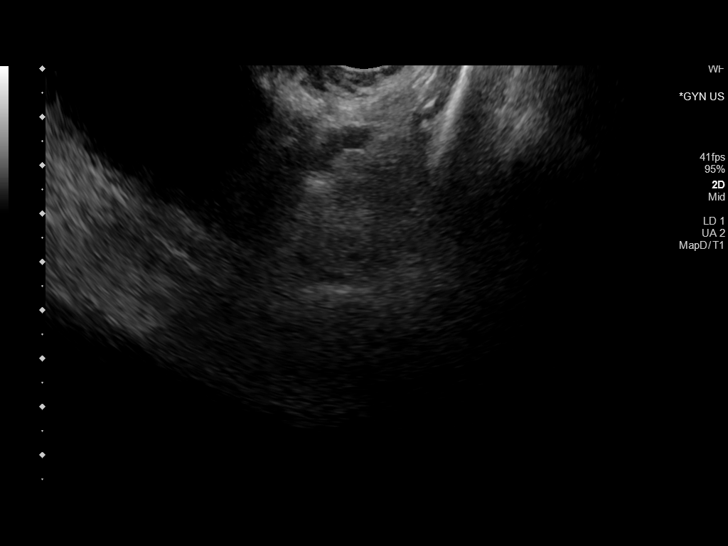

[13 of 25 positions shown; findings below may reference images not displayed]

FINDINGS: Uterus

Surgically absent.

Right ovary

Not visualized.

Left ovary

Circumscribed rounded cystic structure in the left adnexal region
measures 7.9 x 6.7 x 8.7 cm. No mural nodularity, internal
septations, or solid components are identified. The left ovary was
not well seen.

Other findings

No abnormal free fluid.
IMPRESSION: 1. Simple appearing cystic lesion within the left adnexa measuring
up to 8.7 cm. A follow-up ultrasound in 6-12 months is recommended
to assess for growth. This recommendation follows the consensus
statement: Management of Asymptomatic Ovarian and Other Adnexal
Cysts Imaged at US: Society of Radiologists in Ultrasound Consensus
2. Nonvisualization of the right ovary.
3. Surgically absent uterus.

## 2023-07-06 IMAGING — CT CT ABD-PELV W/O CM
2 of 4 series · 16 of 46 positions shown, 18 images · non-contrast
Comparison: 05/11/2020

CLINICAL DATA: Left lower quadrant abdominal pain for 4 days.

EXAM:
CT ABDOMEN AND PELVIS WITHOUT CONTRAST
TECHNIQUE: Multidetector CT imaging of the abdomen and pelvis was performed
following the standard protocol without IV contrast.

[Series 3: axial st · axial · 0.65mm/px · z∈[-423,-38]mm · 13 of 87 slices shown, 15 images]
[im 5/87  soft-tissue]
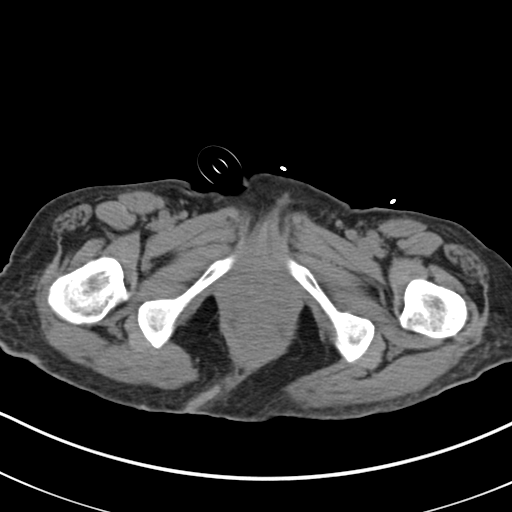
[im 5/87  bone]
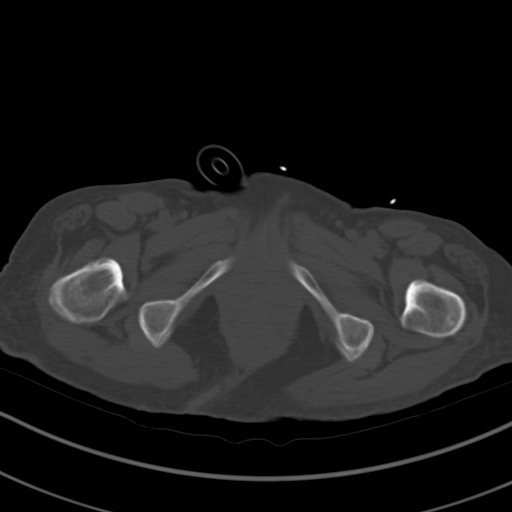
[im 14/87  soft-tissue]
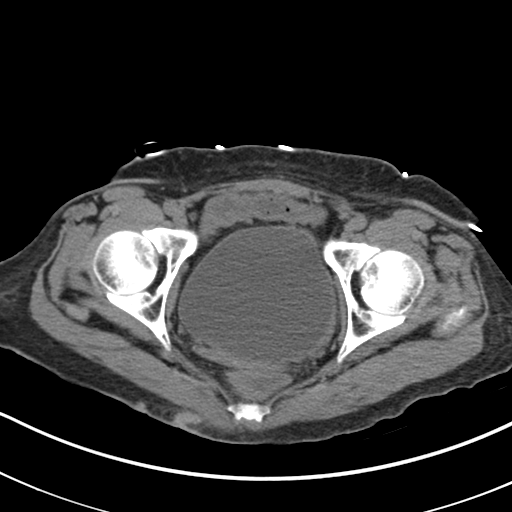
[im 19/87  soft-tissue]
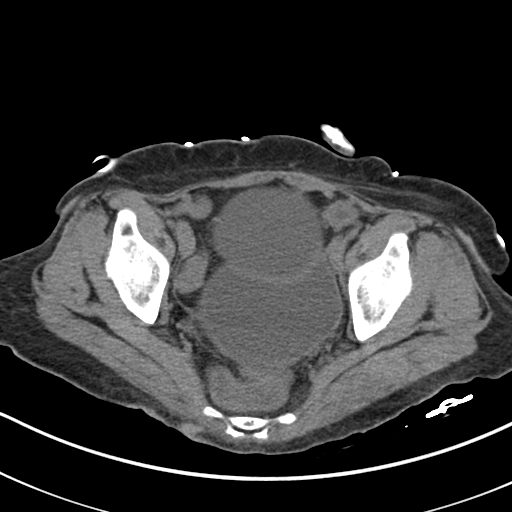
[im 23/87  soft-tissue]
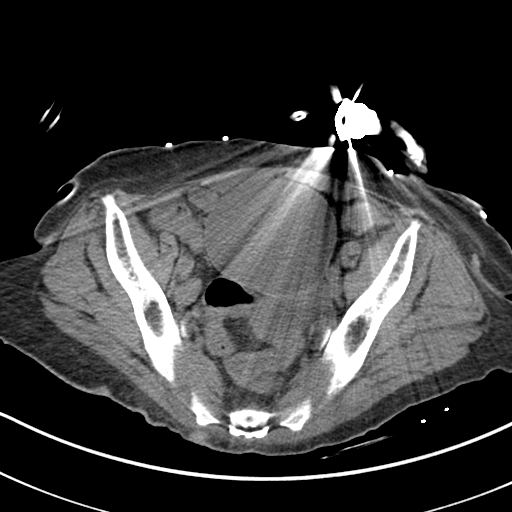
[im 32/87  soft-tissue]
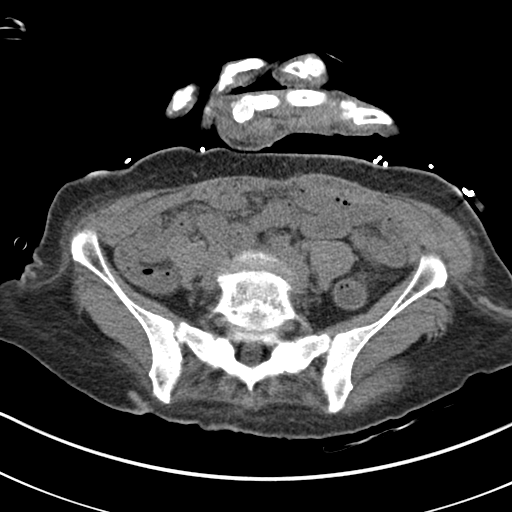
[im 37/87  soft-tissue]
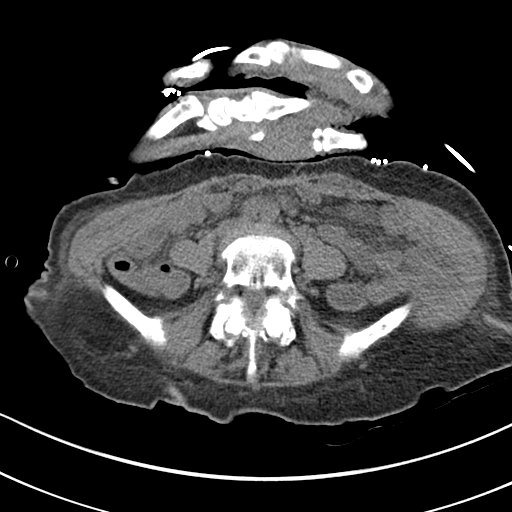
[im 46/87  soft-tissue]
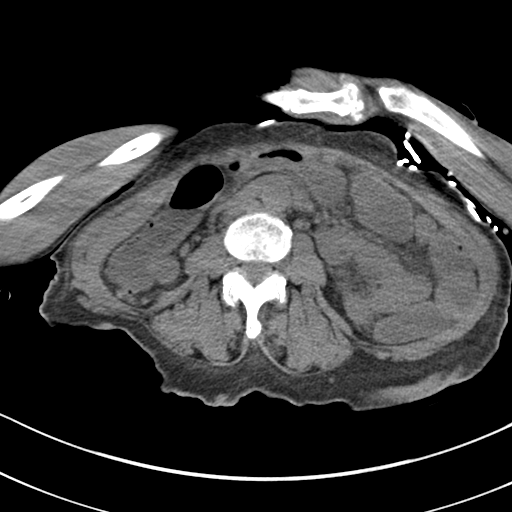
[im 50/87  soft-tissue]
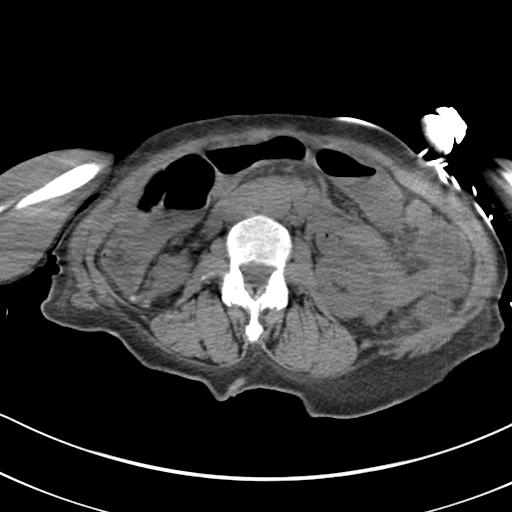
[im 55/87  soft-tissue]
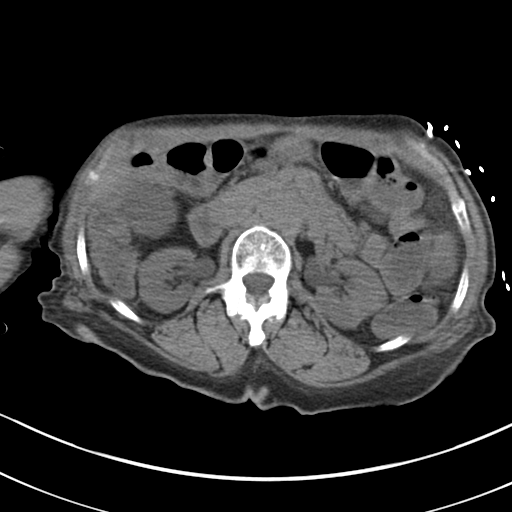
[im 55/87  bone]
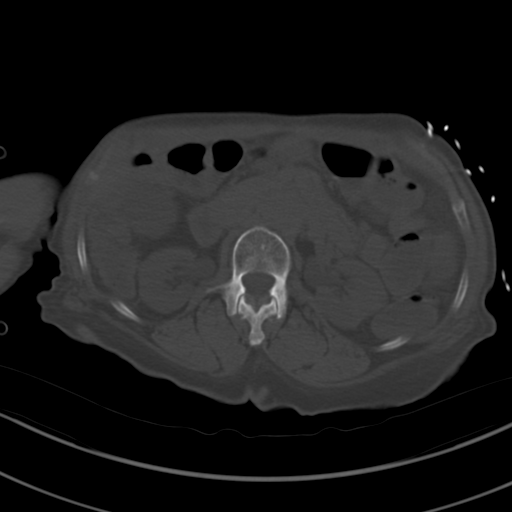
[im 64/87  soft-tissue]
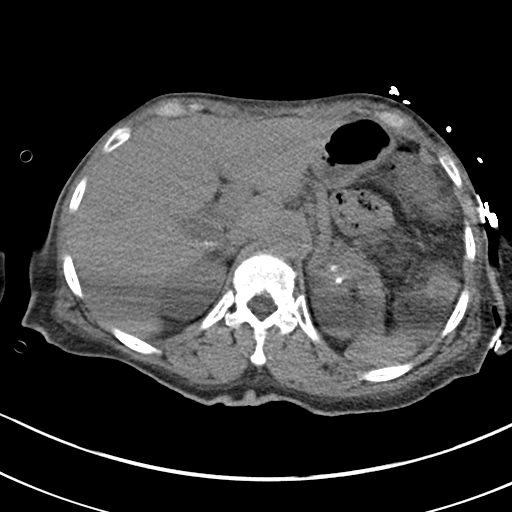
[im 68/87  soft-tissue]
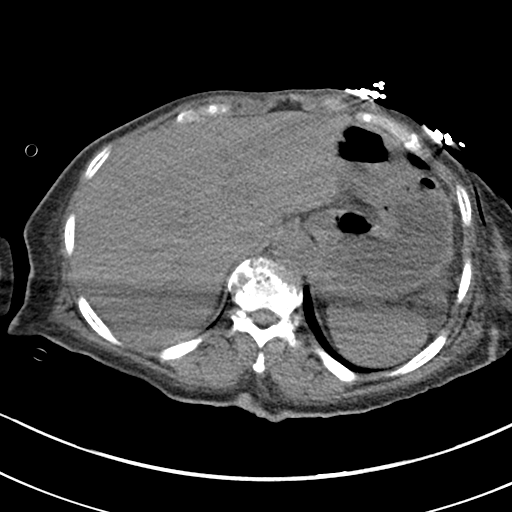
[im 73/87  soft-tissue]
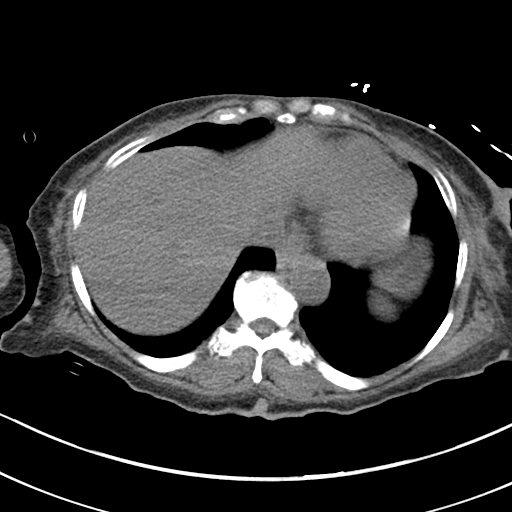
[im 82/87  soft-tissue]
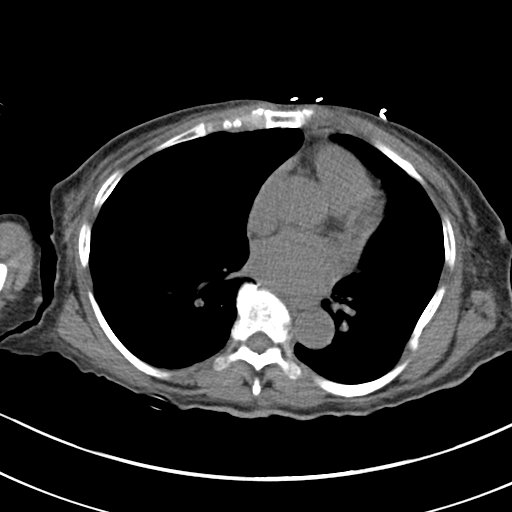

[Series 5: coronal st · coronal · 0.63mm/px · 3 of 115 slices shown]
[im 39/115  soft-tissue]
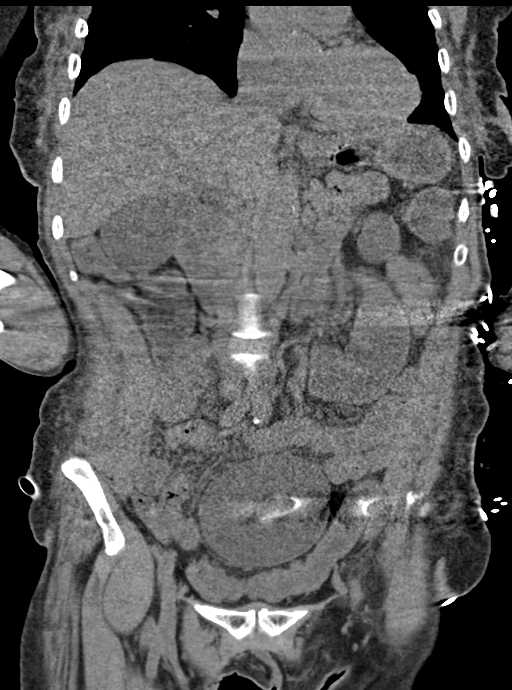
[im 51/115  soft-tissue]
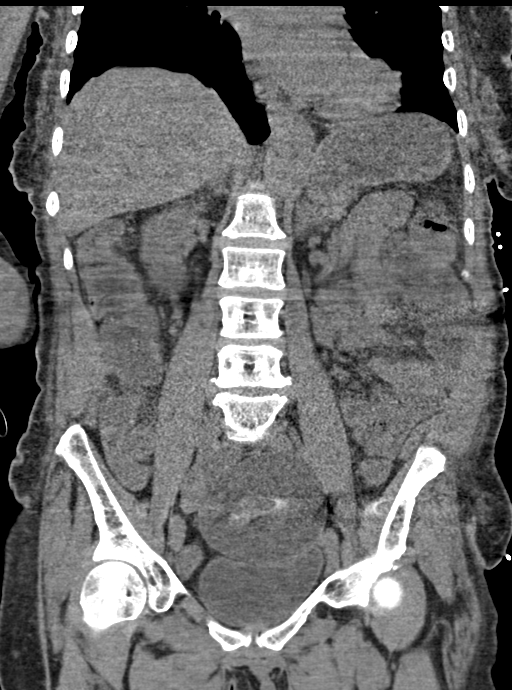
[im 64/115  soft-tissue]
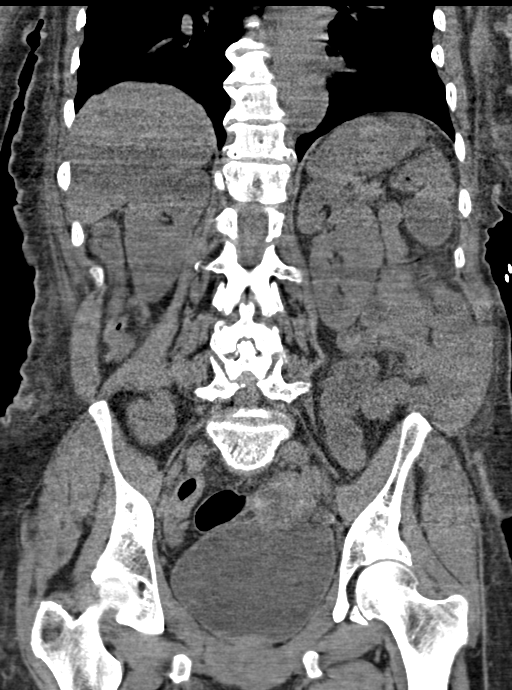

[16 of 46 positions shown; findings below may reference images not displayed]

FINDINGS: Lower chest: Lung bases are clear.

Hepatobiliary: Cholelithiasis with multiple small stones layering in
the gallbladder. No inflammatory changes. No focal liver lesions.
Bile ducts are not dilated.

Pancreas: Unremarkable. No pancreatic ductal dilatation or
surrounding inflammatory changes.

Spleen: Normal in size without focal abnormality.

Adrenals/Urinary Tract: Adrenal glands are unremarkable. Kidneys are
normal, without renal calculi, focal lesion, or hydronephrosis.
Bladder is unremarkable.

Stomach/Bowel: Stomach, small bowel, and colon are mostly
decompressed. No inflammatory changes are appreciated. Appendix is
not identified.

Vascular/Lymphatic: No aortic aneurysm. No significant
lymphadenopathy.

Reproductive: Uterus appears surgically absent. Cystic lesion likely
arising from the left adnexum, measuring 7.9 cm, similar to previous
study.

Other: No free air or free fluid in the abdomen. Abdominal wall
musculature appears intact.

Musculoskeletal: Degenerative changes in the spine. No destructive
bone lesions.
IMPRESSION: 1. No acute process demonstrated in the abdomen or pelvis. No
evidence of bowel obstruction or inflammation. No renal or ureteral
stone or obstruction.
2. Cholelithiasis without evidence of cholecystitis.
3. 7.9 cm diameter cystic lesion in the left adnexum as seen
previously. Because this lesion is not adequately characterized,
prompt US is recommended for further evaluation. Note: This
recommendation does not apply to premenarchal patients and to those
with increased risk (genetic, family history, elevated tumor markers
or other high-risk factors) of ovarian cancer. Reference: JACR 1919

## 2023-07-06 NOTE — Discharge Planning (Signed)
 [facility]  Initial Hemodialysis Orders  Dialysis center: [facility]  Patient's name: Tiffany [name] DOB: 1961/01/13 AKI or ESRD: ESRD  Discharge diagnosis: E. coli urinary tract infection Lower extremity cellulitis   Allergies:  Allergies  Allergen Reactions   Asa [Aspirin ] Other (See Comments)    GI bleed   Deltasone  [Prednisone ] Anaphylaxis, Swelling and Other (See Comments)    Swelling of tongue, lips, throat   Latuda [Lurasidone Hcl] Anaphylaxis   Lortab [Hydrocodone -Acetaminophen ] Hives and Shortness Of Breath   Magnesium -Containing Compounds Anaphylaxis and Other (See Comments)    Tolerated Ensure   Ultram  [Tramadol ] Anaphylaxis, Swelling and Rash   Codeine Nausea And Vomiting and Rash   Desyrel  [Trazodone ] Other (See Comments)    Paranoia    Apresoline  [Hydralazine ] Other (See Comments)    Muscle spasms   Peanut (Diagnostic) Hives and Other (See Comments)    Raw peanuts only  OK to eat peanut butter.   Topamax  [Topiramate ] Other (See Comments)    Paranoia   Sulfa Antibiotics Itching   Tape Rash    Date of First Dialysis: 11/09/2023 Cause of renal disease: DMT2  Dialysis Prescription: Dialysis Frequency: TIW Tx duration: 4 BFR: 400 DFR: Autoflow 1.5 EDW: 85 kg  Dialyzer: 180NRe UF profile/Sodium modeling?: NA Dialysis Bath: 2 K 2 Ca  Dialysis access: Access type: RIJ TDC  Date placed: [place] Surgeon: [place] Needle gauge: NA  In Center Medications: Heparin  Dose: No  Type: NA VDRA: Calcitriol /Hectorol per protocol Venofer: per protocol Mircera: Per protocol    Discharge labs: Hgb: 7.9 K+: 7.2  Ca: 8.8 Phos: 3.8 Alb: 2.6  Please draw Monthly labs on admission  Tiffany [name] [facility] [phone]

## 2023-07-06 NOTE — Progress Notes (Signed)
 [facility] Progress Note   Subjective:   Seen in room.  No issues- remains stable for d/c.  She shares with me that she is feeling better with HD.    Objective Vitals:   07/05/23 2010 07/05/23 2034 07/06/23 0513 07/06/23 0801  BP: (!) 107/58  (!) 107/59 (!) 107/54  Pulse: 75 71 64 64  Resp:  16 18 18   Temp: 98.6 F (37 C)  98.3 F (36.8 C) 98.3 F (36.8 C)  TempSrc: Oral  Oral Oral  SpO2: 96% 94% 96% 97%  Weight:      Height:       Physical Exam General: Alert female in NAD Heart: RRR, no murmurs, rubs or gallops Lungs: CTA bilaterally, respirations unlabored on RA Abdomen: Soft, non-distended, +BS Extremities: No edema b/l lower extremities Dialysis Access: R internal jugular [facility]  Additional Objective Labs: Basic Metabolic Panel: Recent Labs  Lab 06/30/23 0500 07/01/23 0816 07/03/23 0601 07/04/23 0523 07/05/23 0530  NA 135 134* 129* 131* 132*  K 3.5 3.7 4.1 4.2 4.2  CL 98 99 95* 99 98  CO2 25 25 25 27 26   GLUCOSE 105* 119* 87 92 96  BUN 42* 36* 66* 53* 68*  CREATININE 4.50* 3.71* 5.04* 4.15* 4.87*  CALCIUM  8.7* 8.6* 9.0 8.8* 8.8*  PHOS 4.2 3.8  --   --   --    Liver Function Tests: Recent Labs  Lab 06/30/23 0500 07/01/23 0816 07/04/23 1356  BILITOT  --   --  0.3  ALBUMIN  2.4* 2.6*  --    No results for input(s): LIPASE, AMYLASE in the last 168 hours. CBC: Recent Labs  Lab 06/30/23 0500 07/03/23 0601 07/03/23 1450 07/04/23 0523 07/05/23 0530  WBC 5.2 5.3 6.6 6.6 6.3  HGB 9.2* 9.2* 9.1* 8.4* 7.9*  HCT 30.1* 29.5* 29.3* 28.2* 26.5*  MCV 97.1 94.9 93.9 97.2 98.1  PLT 296 289 297 277 275   Blood Culture    Component Value Date/Time   SDES URINE, RANDOM 06/27/2023 1751   SPECREQUEST  06/27/2023 1751    NONE Reflexed from [id number] Performed at [facility], [address] [place], [place] [address]    CULT >=100,000 COLONIES/mL ESCHERICHIA COLI (A) 06/27/2023 1751   REPTSTATUS 06/29/2023 FINAL 06/27/2023 1751     Cardiac Enzymes: Recent Labs  Lab 07/04/23 1356  CKTOTAL 82   CBG: No results for input(s): GLUCAP in the last 168 hours. Iron  Studies:  No results for input(s): IRON , TIBC, TRANSFERRIN, FERRITIN in the last 72 hours.  @lablastinr3 @ Studies/Results: No results found. Medications:    amLODipine   10 mg Oral Daily   atorvastatin   20 mg Oral Daily   budesonide -glycopyrrolate -formoterol   2 puff Inhalation BID   carvedilol   12.5 mg Oral BID   Chlorhexidine  Gluconate Cloth  6 each Topical Daily   Chlorhexidine  Gluconate Cloth  6 each Topical Q0600   dicyclomine   10 mg Oral TID   diphenhydrAMINE   50 mg Oral TID   DULoxetine   30 mg Oral Daily   escitalopram   10 mg Oral Daily   folic acid   1 mg Oral Daily   gabapentin   100 mg Oral QHS   heparin   5,000 Units Subcutaneous Q8H   lamoTRIgine   100 mg Oral Daily   metolazone   2.5 mg Oral Q M,W,F   mirtazapine   15 mg Oral QHS   pantoprazole   40 mg Oral Daily   polyethylene glycol  17 g Oral Daily   QUEtiapine   50 mg  Oral BID   sodium chloride  flush  10-40 mL Intracatheter Q12H    Assessment/Plan: # UTI: presented with fever, urine culture + E coli.  Currently on antibiotics per primary team.   # ESRD: Assigned to [facility] but declines to go there.  Informed renal navigator who is looking into other HD unit for patient.  [facility] for access, working well. Continue TTS schedule. Hoping to go to [facility].  HD tomorrow, first shift   # Hypertension: Resumed home medication, UF with HD.  Normotensive this AM   # Anemia of ESRD: Hemoglobin 9.2.  Dose ESA weekly.   # Metabolic Bone Disease: Ca and phos ok. Does not appear to be on a binder, not needed at this time.    #hypokalemia: added K bath with dialysis     [name] 07/06/2023, 3:41 PM  [facility]

## 2023-07-06 NOTE — TOC Progression Note (Signed)
 Transition of Care [facility]) - Progression Note    Patient Details  Name: Tiffany [name] MRN: [id number] Date of Birth: 1960/06/16  Transition of Care [facility] CM/SW Contact  Tom-Johnson, Jala Dundon Daphne, RN Phone Number: 07/06/2023, 2:59 PM  Clinical Narrative:     Renal Navigator working on outpatient Clinic. Plan for discharge home tomorrow, patient can resume current HD Clinic while waiting for new Clinic.   CM will continue to follow as patient progresses towards discharge.        Expected Discharge Plan and Services                                               Social Determinants of Health (SDOH) Interventions SDOH Screenings   Food Insecurity: Food Insecurity Present (06/28/2023)  Housing: Low Risk  (06/28/2023)  Transportation Needs: No Transportation Needs (06/28/2023)  Utilities: Not At Risk (06/28/2023)  Alcohol Screen: Low Risk  (11/10/2022)  Depression (PHQ2-9): Low Risk  (07/11/2022)  Financial Resource Strain: Low Risk  (07/11/2022)  Physical Activity: Inactive (07/11/2022)  Social Connections: Socially Isolated (06/28/2023)  Stress: Stress Concern Present (07/11/2022)  Tobacco Use: Medium Risk (06/27/2023)    Readmission Risk Interventions    06/29/2023    3:13 PM 06/05/2022    2:47 PM 02/07/2022    2:22 PM  Readmission Risk Prevention Plan  Transportation Screening Complete Complete Complete  Medication Review (RN Care Manager) Referral to Pharmacy Referral to Pharmacy Complete  PCP or Specialist appointment within 3-5 days of discharge Complete Complete Complete  HRI or Home Care Consult Complete Complete Complete  SW Recovery Care/Counseling Consult Complete Complete Complete  Palliative Care Screening Not Applicable Not Applicable Not Applicable  Skilled Nursing Facility Not Applicable Not Applicable Not Applicable

## 2023-07-06 NOTE — Progress Notes (Signed)
 Navigator received notification this morning that pt was denied by providers at [facility]. Contacted local [facility] staff who reports that pt resuming at [facility] is the only available option at this time. Contacted clinic staff at [facility] to confirm appt days/time that can be provided to pt as schedule at d/c. Pt can resume care on Tuesday, June 24 (TTS) and will need to arrive at 10:00 am for 10:25 am chair time. Met with pt at bedside to provide the above info. Pt continues to say that she does want to resume care at [facility]. Navigator advised pt that at this time, this clinic and schedule is what is available. Pt provided info sheet with appt days/time. Also placed HD info on AVS as well. Pt requesting referral be made to [facility] clinics in [place]. Pt states she will check with her transportation to see if it will transport her to a HD clinic in [place] if accepted at clinic. Referral submitted to [facility] admissions today for review. Navigator advised pt that navigator will contact pt via phone regarding [facility] determination once known since plans are for pt to d/c to home tomorrow. Pt agreeable to this plan and navigator confirmed pt's contact numbers. Update provided to attending, nephrologist, renal NP, pt's RN, and RN CM. Renal NP to send orders to [facility] today per clinic's request and navigator sent pt's Hep B antigen lab result to clinic which is required for pt to resume on Tuesday (if pt goes to treatment).   Lauraine Polite Renal Navigator [phone]

## 2023-07-06 NOTE — Progress Notes (Signed)
 PROGRESS NOTE    Tiffany [name]  ZOX:[id number] DOB: 20-Nov-1960 DOA: 06/27/2023 PCP: Danella Dunn, MD    Brief Narrative:   Tiffany [name] is a 63 y.o. female with past medical history significant for ESRD on HD TTS, chronic diastolic congestive heart failure, schizoaffective disorder, anemia of chronic medical/renal disease, OSA, GERD, neuropathy, anxiety/depression, active tobacco abuse, history of seizure disorder who presented to [facility] ED on 06/27/2023 with complaints of leg swelling.  Last had HD performed 5-6 days prior to admission.  Complaining of some redness to her lower extremities as well as fever, and dysuria.  In the ED, temperature 102.2 F, HR 88, RR 28, BP 155/92, SpO2 95% on room air.  WBC 19.6, hemoglobin 11.8, platelet count 258.  Sodium 130, potassium 3.1, chloride 95, CO2 18, glucose 87, BUN 62, creatinine 6.09.  AST 16, ALT 11, total bilirubin 0.6.  High-sensitivity troponin 19> 21.  Lactic acid 1.1.  COVID/influenza/RSV PCR negative.  Urinalysis with moderate leukocytes, negative nitrite, many bacteria, 11-20 WBCs.  Chest x-ray with no active cardiopulmonary disease process.  Blood cultures and urine culture obtained.  Patient was started on IV vancomycin , ceftriaxone .  Nephrology consulted.  TRH consulted for admission for further evaluation management with concerns for UTI, lower extremity cellulitis.  Assessment & Plan:   Sepsis, POA E. coli urinary tract infection Lower extremity cellulitis Patient presenting to ED with complaints of dysuria.  Temperature elevated 102.2 F, WBC count 19.6.  Urinalysis consistent with UTI.  Urine culture positive for E. coli.  WBC count improved to 6.3.  Completed course of antibiotics.  Vaginal itching -- Check wet prep  ESRD on HD -- Nephrology following, appreciate assistance -- Awaiting outpatient HD clinic; may need to return to her previous clinic as no other clinics have excepted as of yet  HTN Chronic diastolic congestive  heart failure -- Amlodipine  10 mg p.o. daily -- Carvedilol  12.5 mg PO twice daily -- Metolazone  2.5 mg every MWF  Schizoaffective disorder -- Seroquel  50 mg p.o. twice daily   Anxiety/depression -- Lexapro  10 mg p.o. daily -- Cymbalta  30 mg p.o. daily  Seizure disorder -- Lamictal  100 mg p.o. daily  Anemia of chronic medical/renal disease Hemoglobin stable, 8.4. -- Transfuse for hemoglobin less than 7.0  Neuropathy -- Gabapentin  100 mg PO qHS  GERD -- Protonix  40 mg p.o. daily  Tobacco use disorder Counseled on need for complete cessation/abstinence  Obesity, class I Body mass index is 34.4 kg/m.    DVT prophylaxis: heparin  injection 5,000 Units Start: 06/27/23 2200    Code Status: Full Code Family Communication: No family present at bedside this morning  Disposition Plan:  Level of care: Telemetry Medical Status is: Inpatient Remains inpatient appropriate because: Anticipate discharge after HD tomorrow    Consultants:  Nephrology  Procedures:  None  Antimicrobials:  Vancomycin  6/11 - 6/11 Ceftriaxone  6/11 - 6/11 Cefazolin  6/11 - 6/14 Keflex  6/15 - 6/18   Subjective: Patient seen examined bedside, lying in bed.  Discussed that no other HD facilities have excepted her as of yet and may need to return back to her previous clinic until 1 can be found outpatient.  Patient understands and reports I will not go back to the clinic and we will just return to the ED for dialysis until 1 is found.  Has completed antibiotics.  Complaining of some vaginal itching, will check repeat urinalysis and wet prep today.  Plan for HD tomorrow and likely discharge home thereafter.   Denies  headache, no dizziness, no chest pain, no palpitations, no shortness of breath, no abdominal pain, no fever/chills/night sweats, no nausea/vomiting/diarrhea.  No acute events overnight per nursing staff.  Objective: Vitals:   07/05/23 2010 07/05/23 2034 07/06/23 0513 07/06/23 0801  BP:  (!) 107/58  (!) 107/59 (!) 107/54  Pulse: 75 71 64 64  Resp:  16 18 18   Temp: 98.6 F (37 C)  98.3 F (36.8 C) 98.3 F (36.8 C)  TempSrc: Oral  Oral Oral  SpO2: 96% 94% 96% 97%  Weight:      Height:        Intake/Output Summary (Last 24 hours) at 07/06/2023 1058 Last data filed at 07/06/2023 0841 Gross per 24 hour  Intake 960 ml  Output 2550 ml  Net -1590 ml   Filed Weights   07/03/23 1200 07/05/23 0750 07/05/23 1155  Weight: 84.5 kg 87.5 kg 85.3 kg    Examination:  Physical Exam: GEN: NAD, alert and oriented x 3, elderly in appearance HEENT: NCAT, PERRL, EOMI, sclera clear, MMM PULM: CTAB w/o wheezes/crackles, normal respiratory effort, on room air CV: RRR w/o M/G/R, HD catheter noted right chest GI: abd soft, NTND, + BS MSK: no peripheral edema, moves all extremities independently NEURO: No focal neurological deficits PSYCH: normal mood/affect Integumentary: No concerning rashes/lesions/wounds noted on exposed skin surfaces    Data Reviewed: I have personally reviewed following labs and imaging studies  CBC: Recent Labs  Lab 06/30/23 0500 07/03/23 0601 07/03/23 1450 07/04/23 0523 07/05/23 0530  WBC 5.2 5.3 6.6 6.6 6.3  HGB 9.2* 9.2* 9.1* 8.4* 7.9*  HCT 30.1* 29.5* 29.3* 28.2* 26.5*  MCV 97.1 94.9 93.9 97.2 98.1  PLT 296 289 297 277 275   Basic Metabolic Panel: Recent Labs  Lab 06/30/23 0500 07/01/23 0816 07/03/23 0601 07/04/23 0523 07/05/23 0530  NA 135 134* 129* 131* 132*  K 3.5 3.7 4.1 4.2 4.2  CL 98 99 95* 99 98  CO2 25 25 25 27 26   GLUCOSE 105* 119* 87 92 96  BUN 42* 36* 66* 53* 68*  CREATININE 4.50* 3.71* 5.04* 4.15* 4.87*  CALCIUM  8.7* 8.6* 9.0 8.8* 8.8*  PHOS 4.2 3.8  --   --   --    GFR: Estimated Creatinine Clearance: 11.8 mL/min (A) (by C-G formula based on SCr of 4.87 mg/dL (H)). Liver Function Tests: Recent Labs  Lab 06/30/23 0500 07/01/23 0816 07/04/23 1356  BILITOT  --   --  0.3  ALBUMIN  2.4* 2.6*  --    No results for  input(s): LIPASE, AMYLASE in the last 168 hours. No results for input(s): AMMONIA  in the last 168 hours. Coagulation Profile: No results for input(s): INR, PROTIME in the last 168 hours.  Cardiac Enzymes: Recent Labs  Lab 07/04/23 1356  CKTOTAL 82   BNP (last 3 results) No results for input(s): PROBNP in the last 8760 hours. HbA1C: No results for input(s): HGBA1C in the last 72 hours. CBG: No results for input(s): GLUCAP in the last 168 hours. Lipid Profile: No results for input(s): CHOL, HDL, LDLCALC, TRIG, CHOLHDL, LDLDIRECT in the last 72 hours. Thyroid  Function Tests: No results for input(s): TSH, T4TOTAL, FREET4, T3FREE, THYROIDAB in the last 72 hours. Anemia Panel: No results for input(s): VITAMINB12, FOLATE, FERRITIN, TIBC, IRON , RETICCTPCT in the last 72 hours. Sepsis Labs: No results for input(s): PROCALCITON, LATICACIDVEN in the last 168 hours.   Recent Results (from the past 240 hours)  Blood Culture (routine x 2)  Status: None   Collection Time: 06/27/23  3:52 PM   Specimen: BLOOD RIGHT HAND  Result Value Ref Range Status   Specimen Description BLOOD RIGHT HAND  Final   Special Requests   Final    BOTTLES DRAWN AEROBIC ONLY Blood Culture results may not be optimal due to an inadequate volume of blood received in culture bottles   Culture   Final    NO GROWTH 5 DAYS Performed at [facility], [address] [place], [place] [address]    Report Status 07/02/2023 FINAL  Final  Blood Culture (routine x 2)     Status: None   Collection Time: 06/27/23  4:02 PM   Specimen: BLOOD RIGHT ARM  Result Value Ref Range Status   Specimen Description BLOOD RIGHT ARM  Final   Special Requests   Final    BOTTLES DRAWN AEROBIC ONLY Blood Culture results may not be optimal due to an inadequate volume of blood received in culture bottles   Culture   Final    NO GROWTH 5 DAYS Performed at [facility],  [address] [place], [place] [address]    Report Status 07/02/2023 FINAL  Final  Resp panel by RT-PCR (RSV, Flu A&B, Covid) Anterior Nasal Swab     Status: None   Collection Time: 06/27/23  4:11 PM   Specimen: Anterior Nasal Swab  Result Value Ref Range Status   SARS Coronavirus 2 by RT PCR NEGATIVE NEGATIVE Final   Influenza A by PCR NEGATIVE NEGATIVE Final   Influenza B by PCR NEGATIVE NEGATIVE Final    Comment: (NOTE) The Xpert Xpress SARS-CoV-2/FLU/RSV plus assay is intended as an aid in the diagnosis of influenza from Nasopharyngeal swab specimens and should not be used as a sole basis for treatment. Nasal washings and aspirates are unacceptable for Xpert Xpress SARS-CoV-2/FLU/RSV testing.  Fact Sheet for Patients: BloggerCourse.com  Fact Sheet for Healthcare Providers: SeriousBroker.it  This test is not yet approved or cleared by the [place]  FDA and has been authorized for detection and/or diagnosis of SARS-CoV-2 by FDA under an Emergency Use Authorization (EUA). This EUA will remain in effect (meaning this test can be used) for the duration of the COVID-19 declaration under Section 564(b)(1) of the Act, 21 U.S.C. section 360bbb-3(b)(1), unless the authorization is terminated or revoked.     Resp Syncytial Virus by PCR NEGATIVE NEGATIVE Final    Comment: (NOTE) Fact Sheet for Patients: BloggerCourse.com  Fact Sheet for Healthcare Providers: SeriousBroker.it  This test is not yet approved or cleared by the [place]  FDA and has been authorized for detection and/or diagnosis of SARS-CoV-2 by FDA under an Emergency Use Authorization (EUA). This EUA will remain in effect (meaning this test can be used) for the duration of the COVID-19 declaration under Section 564(b)(1) of the Act, 21 U.S.C. section 360bbb-3(b)(1), unless the authorization is terminated  or revoked.  Performed at [facility], [address] [place], [place] [address]   Urine Culture     Status: Abnormal   Collection Time: 06/27/23  5:51 PM   Specimen: Urine, Random  Result Value Ref Range Status   Specimen Description URINE, RANDOM  Final   Special Requests   Final    NONE Reflexed from [id number] Performed at [facility], [address] [place], [place] [address]    Culture >=100,000 COLONIES/mL ESCHERICHIA COLI (A)  Final   Report Status 06/29/2023 FINAL  Final   Organism  ID, Bacteria ESCHERICHIA COLI (A)  Final      Susceptibility   Escherichia coli - MIC*    AMPICILLIN >=32 RESISTANT Resistant     CEFAZOLIN  <=4 SENSITIVE Sensitive     CEFEPIME  <=0.12 SENSITIVE Sensitive     CEFTRIAXONE  <=0.25 SENSITIVE Sensitive     CIPROFLOXACIN  1 RESISTANT Resistant     GENTAMICIN <=1 SENSITIVE Sensitive     IMIPENEM <=0.25 SENSITIVE Sensitive     NITROFURANTOIN  <=16 SENSITIVE Sensitive     TRIMETH/SULFA >=320 RESISTANT Resistant     AMPICILLIN/SULBACTAM 16 INTERMEDIATE Intermediate     PIP/TAZO <=4 SENSITIVE Sensitive ug/mL    * >=100,000 COLONIES/mL ESCHERICHIA COLI         Radiology Studies: No results found.      Scheduled Meds:  amLODipine   10 mg Oral Daily   atorvastatin   20 mg Oral Daily   budesonide -glycopyrrolate -formoterol   2 puff Inhalation BID   carvedilol   12.5 mg Oral BID   Chlorhexidine  Gluconate Cloth  6 each Topical Daily   Chlorhexidine  Gluconate Cloth  6 each Topical Q0600   dicyclomine   10 mg Oral TID   diphenhydrAMINE   50 mg Oral TID   DULoxetine   30 mg Oral Daily   escitalopram   10 mg Oral Daily   folic acid   1 mg Oral Daily   gabapentin   100 mg Oral QHS   heparin   5,000 Units Subcutaneous Q8H   lamoTRIgine   100 mg Oral Daily   metolazone   2.5 mg Oral Q M,W,F   mirtazapine   15 mg Oral QHS   pantoprazole   40 mg Oral Daily   polyethylene glycol  17 g Oral Daily   QUEtiapine   50 mg Oral BID   sodium chloride  flush   10-40 mL Intracatheter Q12H   Continuous Infusions:   LOS: 9 days    Time spent: 46 minutes spent on 07/06/2023 caring for this patient face-to-face including chart review, ordering labs/tests, documenting, discussion with nursing staff, consultants, updating family and interview/physical exam    Rema [facility] [place], DO [facility] Available via Epic secure chat 7am-7pm After these hours, please refer to coverage provider listed on [email or web address] 07/06/2023, 10:58 AM

## 2023-07-07 ENCOUNTER — Other Ambulatory Visit (HOSPITAL_COMMUNITY): Payer: Self-pay

## 2023-07-07 DIAGNOSIS — A419 Sepsis, unspecified organism: Secondary | ICD-10-CM | POA: Diagnosis not present

## 2023-07-07 LAB — RENAL FUNCTION PANEL
Albumin: 2.9 g/dL — ABNORMAL LOW (ref 3.5–5.0)
Anion gap: 9 (ref 5–15)
BUN: 56 mg/dL — ABNORMAL HIGH (ref 8–23)
CO2: 25 mmol/L (ref 22–32)
Calcium: 9.2 mg/dL (ref 8.9–10.3)
Chloride: 93 mmol/L — ABNORMAL LOW (ref 98–111)
Creatinine, Ser: 4.52 mg/dL — ABNORMAL HIGH (ref 0.44–1.00)
GFR, Estimated: 10 mL/min — ABNORMAL LOW (ref >60)
Glucose, Bld: 116 mg/dL — ABNORMAL HIGH (ref 70–99)
Phosphorus: 4.3 mg/dL (ref 2.5–4.6)
Potassium: 4.3 mmol/L (ref 3.5–5.1)
Sodium: 127 mmol/L — ABNORMAL LOW (ref 135–145)

## 2023-07-07 LAB — CBC
HCT: 25 % — ABNORMAL LOW (ref 36.0–46.0)
Hemoglobin: 7.7 g/dL — ABNORMAL LOW (ref 12.0–15.0)
MCH: 29.4 pg (ref 26.0–34.0)
MCHC: 30.8 g/dL (ref 30.0–36.0)
MCV: 95.4 fL (ref 80.0–100.0)
Platelets: 271 10*3/uL (ref 150–400)
RBC: 2.62 MIL/uL — ABNORMAL LOW (ref 3.87–5.11)
RDW: 14.4 % (ref 11.5–15.5)
WBC: 6.6 10*3/uL (ref 4.0–10.5)
nRBC: 0 % (ref 0.0–0.2)

## 2023-07-07 MED ORDER — ALBUTEROL SULFATE HFA 108 (90 BASE) MCG/ACT IN AERS
2.0000 | INHALATION_SPRAY | Freq: Four times a day (QID) | RESPIRATORY_TRACT | 2 refills | Status: DC | PRN
  Filled 2023-07-07: qty 8.5, 30d supply, fill #0

## 2023-07-07 MED ORDER — OMEPRAZOLE 40 MG PO CPDR
40.0000 mg | DELAYED_RELEASE_CAPSULE | Freq: Every morning | ORAL | 0 refills | Status: AC
  Filled 2023-07-07: qty 90, 90d supply, fill #0

## 2023-07-07 MED ORDER — QUETIAPINE FUMARATE 50 MG PO TABS: 100.0000 mg | ORAL_TABLET | Freq: Three times a day (TID) | ORAL | 0 refills | Status: DC

## 2023-07-07 MED ORDER — METOLAZONE 2.5 MG PO TABS: 2.5000 mg | ORAL_TABLET | ORAL | 0 refills | Status: DC

## 2023-07-07 MED ORDER — ATORVASTATIN CALCIUM 20 MG PO TABS
20.0000 mg | ORAL_TABLET | Freq: Every day | ORAL | 0 refills | Status: AC
  Filled 2023-07-07: qty 90, 90d supply, fill #0

## 2023-07-07 MED ORDER — EZETIMIBE 10 MG PO TABS
10.0000 mg | ORAL_TABLET | Freq: Every day | ORAL | 0 refills | Status: DC
Start: 2023-07-07 — End: 2023-07-07

## 2023-07-07 MED ORDER — ATORVASTATIN CALCIUM 20 MG PO TABS: 20.0000 mg | ORAL_TABLET | Freq: Every day | ORAL | 0 refills | Status: DC

## 2023-07-07 MED ORDER — QUETIAPINE FUMARATE 50 MG PO TABS
50.0000 mg | ORAL_TABLET | Freq: Two times a day (BID) | ORAL | 0 refills | Status: DC
  Filled 2023-07-07: qty 180, 90d supply, fill #0

## 2023-07-07 MED ORDER — MIRTAZAPINE 7.5 MG PO TABS
15.0000 mg | ORAL_TABLET | Freq: Every day | ORAL | 0 refills | Status: DC
  Filled 2023-07-07: qty 180, 90d supply, fill #0

## 2023-07-07 MED ORDER — DICYCLOMINE HCL 10 MG PO CAPS
10.0000 mg | ORAL_CAPSULE | Freq: Three times a day (TID) | ORAL | 0 refills | Status: DC
  Filled 2023-07-07: qty 270, 90d supply, fill #0

## 2023-07-07 MED ORDER — ALBUTEROL SULFATE HFA 108 (90 BASE) MCG/ACT IN AERS: 2.0000 | INHALATION_SPRAY | Freq: Four times a day (QID) | RESPIRATORY_TRACT | 2 refills | Status: DC | PRN

## 2023-07-07 MED ORDER — CARVEDILOL 12.5 MG PO TABS: 12.5000 mg | ORAL_TABLET | Freq: Two times a day (BID) | ORAL | 0 refills | Status: DC

## 2023-07-07 MED ORDER — AMLODIPINE BESYLATE 10 MG PO TABS: 5.0000 mg | ORAL_TABLET | Freq: Two times a day (BID) | ORAL | 0 refills | Status: DC

## 2023-07-07 MED ORDER — ESCITALOPRAM OXALATE 10 MG PO TABS: 10.0000 mg | ORAL_TABLET | Freq: Every day | ORAL | 0 refills | Status: DC

## 2023-07-07 MED ORDER — CARVEDILOL 12.5 MG PO TABS
12.5000 mg | ORAL_TABLET | Freq: Two times a day (BID) | ORAL | 0 refills | Status: AC
  Filled 2023-07-07 – 2023-07-13 (×2): qty 180, 90d supply, fill #0

## 2023-07-07 MED ORDER — LAMOTRIGINE 100 MG PO TABS: 100.0000 mg | ORAL_TABLET | Freq: Every day | ORAL | 0 refills | Status: DC

## 2023-07-07 MED ORDER — MIRTAZAPINE 7.5 MG PO TABS: 15.0000 mg | ORAL_TABLET | Freq: Every day | ORAL | 0 refills | Status: DC

## 2023-07-07 MED ORDER — OMEPRAZOLE 40 MG PO CPDR: 40.0000 mg | DELAYED_RELEASE_CAPSULE | Freq: Every morning | ORAL | 0 refills | Status: DC

## 2023-07-07 MED ORDER — GABAPENTIN 100 MG PO CAPS
100.0000 mg | ORAL_CAPSULE | Freq: Every day | ORAL | 0 refills | Status: DC
  Filled 2023-07-07: qty 90, 90d supply, fill #0

## 2023-07-07 MED ORDER — FERROUS SULFATE 325 (65 FE) MG PO TABS
325.0000 mg | ORAL_TABLET | Freq: Every day | ORAL | 3 refills | Status: AC
  Filled 2023-07-07: qty 90, 90d supply, fill #0

## 2023-07-07 MED ORDER — FERROUS SULFATE 325 (65 FE) MG PO TABS: 325.0000 mg | ORAL_TABLET | Freq: Every day | ORAL | 3 refills | Status: DC

## 2023-07-07 MED ORDER — ESCITALOPRAM OXALATE 10 MG PO TABS
10.0000 mg | ORAL_TABLET | Freq: Every day | ORAL | 0 refills | Status: DC
  Filled 2023-07-07: qty 90, 90d supply, fill #0

## 2023-07-07 MED ORDER — GABAPENTIN 100 MG PO CAPS: 100.0000 mg | ORAL_CAPSULE | Freq: Every day | ORAL | 0 refills | Status: DC

## 2023-07-07 MED ORDER — METOLAZONE 2.5 MG PO TABS
2.5000 mg | ORAL_TABLET | ORAL | 0 refills | Status: AC
  Filled 2023-07-07: qty 90, 210d supply, fill #0

## 2023-07-07 MED ORDER — EZETIMIBE 10 MG PO TABS
10.0000 mg | ORAL_TABLET | Freq: Every day | ORAL | 0 refills | Status: DC
  Filled 2023-07-07: qty 90, 90d supply, fill #0

## 2023-07-07 MED ORDER — FOLIC ACID 1 MG PO TABS: 1.0000 mg | ORAL_TABLET | Freq: Every day | ORAL | 0 refills | Status: DC

## 2023-07-07 MED ORDER — AMLODIPINE BESYLATE 10 MG PO TABS
5.0000 mg | ORAL_TABLET | Freq: Two times a day (BID) | ORAL | 0 refills | Status: DC
  Filled 2023-07-07: qty 180, 180d supply, fill #0

## 2023-07-07 MED ORDER — DICYCLOMINE HCL 10 MG PO CAPS: 10.0000 mg | ORAL_CAPSULE | Freq: Three times a day (TID) | ORAL | 0 refills | Status: DC

## 2023-07-07 MED ORDER — DULOXETINE HCL 30 MG PO CPEP
30.0000 mg | ORAL_CAPSULE | Freq: Every day | ORAL | 0 refills | Status: DC
  Filled 2023-07-07: qty 90, 90d supply, fill #0

## 2023-07-07 MED ORDER — TRELEGY ELLIPTA 100-62.5-25 MCG/ACT IN AEPB
1.0000 | INHALATION_SPRAY | Freq: Every day | RESPIRATORY_TRACT | 3 refills | Status: DC
Start: 2023-07-07 — End: 2023-09-07
  Filled 2023-07-07: qty 60, fill #0

## 2023-07-07 MED ORDER — FOLIC ACID 1 MG PO TABS
1.0000 mg | ORAL_TABLET | Freq: Every day | ORAL | 0 refills | Status: AC
Start: 2023-07-07 — End: ?
  Filled 2023-07-07: qty 90, 90d supply, fill #0

## 2023-07-07 MED ORDER — DULOXETINE HCL 30 MG PO CPEP: 30.0000 mg | ORAL_CAPSULE | Freq: Every day | ORAL | 0 refills | Status: DC

## 2023-07-07 MED ORDER — CALCITRIOL 0.5 MCG PO CAPS: 0.5000 ug | ORAL_CAPSULE | Freq: Every day | ORAL | 0 refills | Status: DC

## 2023-07-07 MED ORDER — CALCITRIOL 0.5 MCG PO CAPS
0.5000 ug | ORAL_CAPSULE | Freq: Every day | ORAL | 0 refills | Status: AC
  Filled 2023-07-07: qty 90, 90d supply, fill #0

## 2023-07-07 MED ORDER — LAMOTRIGINE 100 MG PO TABS
100.0000 mg | ORAL_TABLET | Freq: Every day | ORAL | 0 refills | Status: DC
  Filled 2023-07-07: qty 90, 90d supply, fill #0

## 2023-07-07 MED ORDER — TRELEGY ELLIPTA 100-62.5-25 MCG/ACT IN AEPB: 1.0000 | INHALATION_SPRAY | Freq: Every day | RESPIRATORY_TRACT | 3 refills | Status: DC

## 2023-07-07 NOTE — Discharge Planning (Signed)
 [facility] Patient Discharge Orders - [facility] CLINIC: [place]  Patient's name: Tiffany [name] Admit/DC Dates: 06/27/2023 - 07/07/23  DISCHARGE DIAGNOSES: Sepsis -> E. Coli UTI  and LE cellulitis    HD ORDER CHANGES: Heparin  change: no EDW Change: no Bath Change: no  ANEMIA MANAGEMENT: Aranesp : Given: no    ESA dose for discharge: mircera 100 mcg IV q 2 weeks, to start on 6/24 IV Iron  dose at discharge: per protocol Transfusion: Given: no  BONE/MINERAL MEDICATIONS: Hectorol/Calcitriol  change: no Sensipar/Parsabiv change: no  ACCESS INTERVENTION/CHANGE: no Details:   RECENT LABS: Recent Labs  Lab 07/07/23 0820  HGB 7.7*  NA 127*  K 4.3  CALCIUM  9.2  PHOS 4.3  ALBUMIN  2.9*    IV ANTIBIOTICS: no Details:  OTHER ANTICOAGULATION: On Coumadin?: no   OTHER/APPTS/LAB ORDERS:  - Wants to transfer to [facility] unit - continue to work on this as outpatient. Dwayne knows the whole story if you need to ask her  D/C Meds to be reconciled by nurse after every discharge.  Completed By: Izetta Boehringer, PA-C [facility] Pager [phone]   Reviewed by: MD:______ RN_______

## 2023-07-07 NOTE — Discharge Summary (Signed)
 Physician Discharge Summary  Tiffany [name] FMW:[id number] DOB: 02-15-1960 DOA: 06/27/2023  PCP: Ilah Crigler, MD  Admit date: 06/27/2023 Discharge date: 07/07/2023  Admitted From: Home Disposition: Home   Recommendations for Outpatient Follow-up:  Follow up with PCP in 1-2 weeks Please obtain BMP/CBC in one week Please follow up on the following pending results:  Home Health: No Equipment/Devices: None  Discharge Condition: Stable CODE STATUS: Stable Diet recommendation: Renal diet with 1200 mL fluid restriction  History of present illness:  Tiffany [name] is a 63 y.o. female with past medical history significant for ESRD on HD TTS, chronic diastolic congestive heart failure, schizoaffective disorder, anemia of chronic medical/renal disease, OSA, GERD, neuropathy, anxiety/depression, active tobacco abuse, history of seizure disorder who presented to [facility] ED on 06/27/2023 with complaints of leg swelling.  Last had HD performed 5-6 days prior to admission.  Complaining of some redness to her lower extremities as well as fever, and dysuria.   In the ED, temperature 102.2 F, HR 88, RR 28, BP 155/92, SpO2 95% on room air.  WBC 19.6, hemoglobin 11.8, platelet count 258.  Sodium 130, potassium 3.1, chloride 95, CO2 18, glucose 87, BUN 62, creatinine 6.09.  AST 16, ALT 11, total bilirubin 0.6.  High-sensitivity troponin 19> 21.  Lactic acid 1.1.  COVID/influenza/RSV PCR negative.  Urinalysis with moderate leukocytes, negative nitrite, many bacteria, 11-20 WBCs.  Chest x-ray with no active cardiopulmonary disease process.  Blood cultures and urine culture obtained.  Patient was started on IV vancomycin , ceftriaxone .  Nephrology consulted.  TRH consulted for admission for further evaluation management with concerns for UTI, lower extremity cellulitis.  Hospital course:  Sepsis, POA E. coli urinary tract infection Lower extremity cellulitis Patient presenting to ED with complaints of dysuria.   Temperature elevated 102.2 F, WBC count 19.6.  Urinalysis consistent with UTI.  Urine culture positive for E. coli.  WBC count improved to 6.3.  Completed course of antibiotics.   ESRD on HD Nephrology consulted and followed during hospital course for continue HD while inpatient.   HTN Chronic diastolic congestive heart failure Continue amlodipine  10 mg p.o. daily, Carvedilol  12.5 mg PO twice daily, Metolazone  2.5 mg every MWF, torsemide .   Schizoaffective disorder Continue Seroquel .   Anxiety/depression Lexapro  10 mg p.o. daily, Cymbalta  30 mg p.o. daily   Seizure disorder Lamictal  100 mg p.o. daily   Anemia of chronic medical/renal disease Hemoglobin stable, 8.4.   Neuropathy Gabapentin  100 mg PO qHS   GERD Continue omeprazole    Tobacco use disorder Counseled on need for complete cessation/abstinence   Obesity, class I Body mass index is 34.4 kg/m.   Discharge Diagnoses:  Principal Problem:   Sepsis (HCC) Active Problems:   Acute on chronic diastolic heart failure (HCC)   Hypersomnia, Chronic   Morbid obesity (HCC)   OSA and COPD overlap syndrome (HCC)   History of Anoxic brain injury (HCC)   Schizoaffective disorder, bipolar type (HCC)   GERD (gastroesophageal reflux disease)   ESRD on dialysis [facility])    Discharge Instructions  Discharge Instructions     Call MD for:  difficulty breathing, headache or visual disturbances   Complete by: As directed    Call MD for:  extreme fatigue   Complete by: As directed    Call MD for:  persistant dizziness or light-headedness   Complete by: As directed    Call MD for:  persistant nausea and vomiting   Complete by: As directed    Call MD for:  severe uncontrolled pain   Complete by: As directed    Call MD for:  temperature >100.4   Complete by: As directed    Diet - low sodium heart healthy   Complete by: As directed    Increase activity slowly   Complete by: As directed       Allergies as of 07/07/2023        Reactions   Asa [aspirin ] Other (See Comments)   GI bleed   Deltasone  [prednisone ] Anaphylaxis, Swelling, Other (See Comments)   Swelling of tongue, lips, throat   Latuda [lurasidone Hcl] Anaphylaxis   Lortab [hydrocodone -acetaminophen ] Hives, Shortness Of Breath   Magnesium -containing Compounds Anaphylaxis, Other (See Comments)   Tolerated Ensure   Ultram  [tramadol ] Anaphylaxis, Swelling, Rash   Codeine Nausea And Vomiting, Rash   Desyrel  [trazodone ] Other (See Comments)   Paranoia    Apresoline  [hydralazine ] Other (See Comments)   Muscle spasms   Peanut (diagnostic) Hives, Other (See Comments)   Raw peanuts only  OK to eat peanut butter.   Topamax  [topiramate ] Other (See Comments)   Paranoia   Sulfa Antibiotics Itching   Tape Rash        Medication List     STOP taking these medications    cephALEXin  500 MG capsule Commonly known as: KEFLEX        TAKE these medications    albuterol  108 (90 Base) MCG/ACT inhaler Commonly known as: VENTOLIN  HFA Inhale 2 puffs into the lungs every 6 (six) hours as needed for wheezing or shortness of breath.   amLODipine  10 MG tablet Commonly known as: NORVASC  Take 0.5 tablets (5 mg total) by mouth 2 (two) times daily.   atorvastatin  20 MG tablet Commonly known as: LIPITOR  Take 1 tablet (20 mg total) by mouth daily.   calcitRIOL  0.5 MCG capsule Commonly known as: ROCALTROL  Take 1 capsule (0.5 mcg total) by mouth daily.   carvedilol  12.5 MG tablet Commonly known as: COREG  Take 1 tablet (12.5 mg total) by mouth 2 (two) times daily.   Centrum Silver  50+Women Tabs Take 1 tablet by mouth at bedtime.   cyclobenzaprine  10 MG tablet Commonly known as: FLEXERIL  Take 1 tablet (10 mg total) by mouth 3 (three) times daily.   dicyclomine  10 MG capsule Commonly known as: BENTYL  Take 1 capsule (10 mg total) by mouth 3 (three) times daily.   DULoxetine  30 MG capsule Commonly known as: CYMBALTA  Take 1 capsule (30 mg total)  by mouth daily.   epoetin  alfa 10000 UNIT/ML injection Commonly known as: EPOGEN  Inject 1 mL (10,000 Units total) into the vein every Monday, Wednesday, and Friday with hemodialysis.   escitalopram  10 MG tablet Commonly known as: LEXAPRO  Take 1 tablet (10 mg total) by mouth daily.   ezetimibe  10 MG tablet Commonly known as: ZETIA  Take 1 tablet (10 mg total) by mouth daily. Please call office to schedule an appt for further refills. Thank you   ferrous sulfate  325 (65 FE) MG tablet Take 1 tablet (325 mg total) by mouth daily.   folic acid  1 MG tablet Commonly known as: FOLVITE  Take 1 tablet (1 mg total) by mouth daily.   gabapentin  100 MG capsule Commonly known as: NEURONTIN  Take 1 capsule (100 mg total) by mouth at bedtime.   hydrOXYzine  10 MG tablet Commonly known as: ATARAX  Take 1 tablet (10 mg total) by mouth 3 (three) times daily as needed for anxiety. What changed: when to take this   ibuprofen  200 MG tablet Commonly known as:  ADVIL  Take 200 mg by mouth every 6 (six) hours as needed for headache or moderate pain (pain score 4-6).   lamoTRIgine  100 MG tablet Commonly known as: LAMICTAL  Take 1 tablet (100 mg total) by mouth at bedtime.   lidocaine  5 % Commonly known as: LIDODERM  Place 1 patch onto the skin daily. Remove & Discard patch within 12 hours or as directed by MD What changed:  when to take this reasons to take this additional instructions   loperamide  2 MG capsule Commonly known as: IMODIUM  Take 1 capsule (2 mg total) by mouth 2 (two) times daily. What changed: when to take this   MELATONIN PO Take 1 tablet by mouth at bedtime.   metolazone  2.5 MG tablet Commonly known as: ZAROXOLYN  Take 1 tablet (2.5 mg total) by mouth every Monday, Wednesday, and Friday. Start taking on: July 09, 2023   mirtazapine  7.5 MG tablet Commonly known as: REMERON  Take 2 tablets (15 mg total) by mouth at bedtime.   omeprazole  40 MG capsule Commonly known as:  PRILOSEC Take 1 capsule (40 mg total) by mouth every morning.   prazosin  2 MG capsule Commonly known as: MINIPRESS  Take 1 capsule (2 mg total) by mouth at bedtime.   QUEtiapine  50 MG tablet Commonly known as: SEROQUEL  Take 2 tablets (100 mg total) by mouth 3 (three) times daily. What changed:  how much to take when to take this Another medication with the same name was removed. Continue taking this medication, and follow the directions you see here.   Saphris  10 MG Subl Generic drug: Asenapine  Maleate Place 10 mg under the tongue at bedtime.   torsemide  20 MG tablet Commonly known as: DEMADEX  Take 1 tablet (20 mg total) by mouth 2 (two) times daily. What changed:  how much to take how to take this when to take this   Trelegy Ellipta  100-62.5-25 MCG/ACT Aepb Generic drug: Fluticasone -Umeclidin-Vilant Inhale 1 puff into the lungs daily.        Follow-up Information     [facility], [facility]. Go on 07/10/2023.   Why: Schedule is Tuesday, Thursday, Saturday.  Please arrive at 10:00 am for 10:25 am chair time. Contact information: [address] [place] [address] [phone]                Allergies  Allergen Reactions   Dorethia [Aspirin ] Other (See Comments)    GI bleed   Deltasone  [Prednisone ] Anaphylaxis, Swelling and Other (See Comments)    Swelling of tongue, lips, throat   Latuda [Lurasidone Hcl] Anaphylaxis   Lortab [Hydrocodone -Acetaminophen ] Hives and Shortness Of Breath   Magnesium -Containing Compounds Anaphylaxis and Other (See Comments)    Tolerated Ensure   Ultram  [Tramadol ] Anaphylaxis, Swelling and Rash   Codeine Nausea And Vomiting and Rash   Desyrel  [Trazodone ] Other (See Comments)    Paranoia    Apresoline  [Hydralazine ] Other (See Comments)    Muscle spasms   Peanut (Diagnostic) Hives and Other (See Comments)    Raw peanuts only  OK to eat peanut butter.   Topamax  [Topiramate ] Other (See Comments)    Paranoia    Sulfa Antibiotics Itching   Tape Rash    Consultations: Nephrology   Procedures/Studies: DG Chest Port 1 View Result Date: 06/27/2023 CLINICAL DATA:  Chest pain and shortness of breath. EXAM: PORTABLE CHEST 1 VIEW COMPARISON:  June 21, 2023. FINDINGS: Stable cardiomegaly. Right internal jugular dialysis catheter is unchanged. Both lungs are clear. Old left rib fractures. IMPRESSION: No active  disease. Electronically Signed   By: Lynwood Landy Raddle M.D.   On: 06/27/2023 15:30   DG Chest 2 View Result Date: 06/21/2023 CLINICAL DATA:  Lower extremity edema. EXAM: CHEST - 2 VIEW COMPARISON:  Jun 15, 2023. FINDINGS: Stable cardiomediastinal silhouette. Right internal jugular dialysis catheter is unchanged. Old left rib fractures. No acute pulmonary disease. IMPRESSION: No active cardiopulmonary disease. Electronically Signed   By: Lynwood Landy Raddle M.D.   On: 06/21/2023 15:08   DG Chest 2 View Result Date: 06/15/2023 CLINICAL DATA:  Chest pain EXAM: CHEST - 2 VIEW COMPARISON:  Chest x-ray 06/05/2023 FINDINGS: The heart is enlarged. Right chest port catheter tip ends in the distal SVC. There is no focal lung infiltrate, pleural effusion or pneumothorax. There are healed left-sided rib fractures. IMPRESSION: Cardiomegaly. No acute cardiopulmonary process. Electronically Signed   By: Greig Pique M.D.   On: 06/15/2023 17:50   DG Chest 2 View Result Date: 06/13/2023 CLINICAL DATA:  Missed dialysis EXAM: CHEST - 2 VIEW COMPARISON:  03/10/2023 FINDINGS: Right-sided central venous catheter tip at the right atrium. Stable cardiomediastinal silhouette. Mild central vascular congestion. No overt edema or sizable pleural effusion. No pneumothorax IMPRESSION: Mild central vascular congestion. Electronically Signed   By: Luke Bun M.D.   On: 06/13/2023 23:17     Subjective: Patient seen examined bedside, currently undergoing hemodialysis.  Discharging home today.  Requesting refill of her home medications.   No other questions, concerns or complaints at this time.  Denies headache, no dizziness, no chest pain, no palpitations, no shortness of breath, no abdominal pain, no fever/chills/night sweats, no nausea/vomiting/diarrhea, no focal weakness, no fatigue, no paresthesia.  No acute events overnight per nurse staff.  Discharge Exam: Vitals:   07/07/23 0930 07/07/23 1000  BP: 121/68 132/70  Pulse: 71 72  Resp: 13 13  Temp:    SpO2: 97% 100%   Vitals:   07/07/23 0850 07/07/23 0900 07/07/23 0930 07/07/23 1000  BP: 125/88 130/73 121/68 132/70  Pulse: 71 72 71 72  Resp: 16 15 13 13   Temp:      TempSrc:      SpO2: 96% 96% 97% 100%  Weight:      Height:        Physical Exam: GEN: NAD, alert and oriented x 3, elderly in appearance HEENT: NCAT, PERRL, EOMI, sclera clear, MMM PULM: CTAB w/o wheezes/crackles, normal respiratory effort, on room air CV: RRR w/o M/G/R, HD catheter noted right chest GI: abd soft, NTND, + BS MSK: no peripheral edema, moves all extremities independently NEURO: No focal neurological deficits PSYCH: normal mood/affect Integumentary: No concerning rashes/lesions/wounds noted on exposed skin surfaces    The results of significant diagnostics from this hospitalization (including imaging, microbiology, ancillary and laboratory) are listed below for reference.     Microbiology: Recent Results (from the past 240 hours)  Blood Culture (routine x 2)     Status: None   Collection Time: 06/27/23  3:52 PM   Specimen: BLOOD RIGHT HAND  Result Value Ref Range Status   Specimen Description BLOOD RIGHT HAND  Final   Special Requests   Final    BOTTLES DRAWN AEROBIC ONLY Blood Culture results may not be optimal due to an inadequate volume of blood received in culture bottles   Culture   Final    NO GROWTH 5 DAYS Performed at [facility] Lab, [address] [place], [place] [address]    Report Status 07/02/2023 FINAL  Final  Blood  Culture (routine x 2)     Status:  None   Collection Time: 06/27/23  4:02 PM   Specimen: BLOOD RIGHT ARM  Result Value Ref Range Status   Specimen Description BLOOD RIGHT ARM  Final   Special Requests   Final    BOTTLES DRAWN AEROBIC ONLY Blood Culture results may not be optimal due to an inadequate volume of blood received in culture bottles   Culture   Final    NO GROWTH 5 DAYS Performed at [facility] Lab, [address] [place], [place] [address]    Report Status 07/02/2023 FINAL  Final  Resp panel by RT-PCR (RSV, Flu A&B, Covid) Anterior Nasal Swab     Status: None   Collection Time: 06/27/23  4:11 PM   Specimen: Anterior Nasal Swab  Result Value Ref Range Status   SARS Coronavirus 2 by RT PCR NEGATIVE NEGATIVE Final   Influenza A by PCR NEGATIVE NEGATIVE Final   Influenza B by PCR NEGATIVE NEGATIVE Final    Comment: (NOTE) The Xpert Xpress SARS-CoV-2/FLU/RSV plus assay is intended as an aid in the diagnosis of influenza from Nasopharyngeal swab specimens and should not be used as a sole basis for treatment. Nasal washings and aspirates are unacceptable for Xpert Xpress SARS-CoV-2/FLU/RSV testing.  Fact Sheet for Patients: BloggerCourse.com  Fact Sheet for Healthcare Providers: SeriousBroker.it  This test is not yet approved or cleared by the [place]  FDA and has been authorized for detection and/or diagnosis of SARS-CoV-2 by FDA under an Emergency Use Authorization (EUA). This EUA will remain in effect (meaning this test can be used) for the duration of the COVID-19 declaration under Section 564(b)(1) of the Act, 21 U.S.C. section 360bbb-3(b)(1), unless the authorization is terminated or revoked.     Resp Syncytial Virus by PCR NEGATIVE NEGATIVE Final    Comment: (NOTE) Fact Sheet for Patients: BloggerCourse.com  Fact Sheet for Healthcare Providers: SeriousBroker.it  This test is not yet  approved or cleared by the [place]  FDA and has been authorized for detection and/or diagnosis of SARS-CoV-2 by FDA under an Emergency Use Authorization (EUA). This EUA will remain in effect (meaning this test can be used) for the duration of the COVID-19 declaration under Section 564(b)(1) of the Act, 21 U.S.C. section 360bbb-3(b)(1), unless the authorization is terminated or revoked.  Performed at [facility], [address], [place], [place] [address]   Urine Culture     Status: Abnormal   Collection Time: 06/27/23  5:51 PM   Specimen: Urine, Random  Result Value Ref Range Status   Specimen Description URINE, RANDOM  Final   Special Requests   Final    NONE Reflexed from [id number] Performed at [facility], [address] [place], [place] [address]    Culture >=100,000 COLONIES/mL ESCHERICHIA COLI (A)  Final   Report Status 06/29/2023 FINAL  Final   Organism ID, Bacteria ESCHERICHIA COLI (A)  Final      Susceptibility   Escherichia coli - MIC*    AMPICILLIN >=32 RESISTANT Resistant     CEFAZOLIN  <=4 SENSITIVE Sensitive     CEFEPIME  <=0.12 SENSITIVE Sensitive     CEFTRIAXONE  <=0.25 SENSITIVE Sensitive     CIPROFLOXACIN  1 RESISTANT Resistant     GENTAMICIN <=1 SENSITIVE Sensitive     IMIPENEM <=0.25 SENSITIVE Sensitive     NITROFURANTOIN  <=16 SENSITIVE Sensitive     TRIMETH/SULFA >=320 RESISTANT Resistant     AMPICILLIN/SULBACTAM 16 INTERMEDIATE Intermediate  PIP/TAZO <=4 SENSITIVE Sensitive ug/mL    * >=100,000 COLONIES/mL ESCHERICHIA COLI  Wet prep, genital     Status: None   Collection Time: 07/06/23  3:00 PM   Specimen: Vaginal  Result Value Ref Range Status   Yeast Wet Prep HPF POC NONE SEEN NONE SEEN Final   Trich, Wet Prep NONE SEEN NONE SEEN Final   Clue Cells Wet Prep HPF POC NONE SEEN NONE SEEN Final   WBC, Wet Prep HPF POC <10 <10 Final   Sperm NONE SEEN  Final    Comment: Performed at [facility], [address], [place],  [place] [address]     Labs: BNP (last 3 results) Recent Labs    11/07/22 1318 06/15/23 1514 06/21/23 1504  BNP 121.5* 43.2 29.1   Basic Metabolic Panel: Recent Labs  Lab 07/01/23 0816 07/03/23 0601 07/04/23 0523 07/05/23 0530 07/07/23 0820  NA 134* 129* 131* 132* 127*  K 3.7 4.1 4.2 4.2 4.3  CL 99 95* 99 98 93*  CO2 25 25 27 26 25   GLUCOSE 119* 87 92 96 116*  BUN 36* 66* 53* 68* 56*  CREATININE 3.71* 5.04* 4.15* 4.87* 4.52*  CALCIUM  8.6* 9.0 8.8* 8.8* 9.2  PHOS 3.8  --   --   --  4.3   Liver Function Tests: Recent Labs  Lab 07/01/23 0816 07/04/23 1356 07/07/23 0820  BILITOT  --  0.3  --   ALBUMIN  2.6*  --  2.9*   No results for input(s): LIPASE, AMYLASE in the last 168 hours. No results for input(s): AMMONIA  in the last 168 hours. CBC: Recent Labs  Lab 07/03/23 0601 07/03/23 1450 07/04/23 0523 07/05/23 0530 07/07/23 0820  WBC 5.3 6.6 6.6 6.3 6.6  HGB 9.2* 9.1* 8.4* 7.9* 7.7*  HCT 29.5* 29.3* 28.2* 26.5* 25.0*  MCV 94.9 93.9 97.2 98.1 95.4  PLT 289 297 277 275 271   Cardiac Enzymes: Recent Labs  Lab 07/04/23 1356  CKTOTAL 82   BNP: Invalid input(s): POCBNP CBG: No results for input(s): GLUCAP in the last 168 hours. D-Dimer No results for input(s): DDIMER in the last 72 hours. Hgb A1c No results for input(s): HGBA1C in the last 72 hours. Lipid Profile No results for input(s): CHOL, HDL, LDLCALC, TRIG, CHOLHDL, LDLDIRECT in the last 72 hours. Thyroid  function studies No results for input(s): TSH, T4TOTAL, T3FREE, THYROIDAB in the last 72 hours.  Invalid input(s): FREET3 Anemia work up No results for input(s): VITAMINB12, FOLATE, FERRITIN, TIBC, IRON , RETICCTPCT in the last 72 hours. Urinalysis    Component Value Date/Time   COLORURINE YELLOW 07/06/2023 0911   APPEARANCEUR CLOUDY (A) 07/06/2023 0911   LABSPEC 1.006 07/06/2023 0911   PHURINE 7.0 07/06/2023 0911   GLUCOSEU NEGATIVE 07/06/2023 0911    HGBUR NEGATIVE 07/06/2023 0911   BILIRUBINUR NEGATIVE 07/06/2023 0911   BILIRUBINUR NEG 05/21/2014 1155   KETONESUR NEGATIVE 07/06/2023 0911   PROTEINUR NEGATIVE 07/06/2023 0911   UROBILINOGEN 0.2 09/30/2019 1557   NITRITE NEGATIVE 07/06/2023 0911   LEUKOCYTESUR NEGATIVE 07/06/2023 0911   Sepsis Labs Recent Labs  Lab 07/03/23 1450 07/04/23 0523 07/05/23 0530 07/07/23 0820  WBC 6.6 6.6 6.3 6.6   Microbiology Recent Results (from the past 240 hours)  Blood Culture (routine x 2)     Status: None   Collection Time: 06/27/23  3:52 PM   Specimen: BLOOD RIGHT HAND  Result Value Ref Range Status   Specimen Description BLOOD RIGHT HAND  Final   Special Requests  Final    BOTTLES DRAWN AEROBIC ONLY Blood Culture results may not be optimal due to an inadequate volume of blood received in culture bottles   Culture   Final    NO GROWTH 5 DAYS Performed at [facility], [address] [place], [place] [address]    Report Status 07/02/2023 FINAL  Final  Blood Culture (routine x 2)     Status: None   Collection Time: 06/27/23  4:02 PM   Specimen: BLOOD RIGHT ARM  Result Value Ref Range Status   Specimen Description BLOOD RIGHT ARM  Final   Special Requests   Final    BOTTLES DRAWN AEROBIC ONLY Blood Culture results may not be optimal due to an inadequate volume of blood received in culture bottles   Culture   Final    NO GROWTH 5 DAYS Performed at [facility], [address], [place], [place] [address]    Report Status 07/02/2023 FINAL  Final  Resp panel by RT-PCR (RSV, Flu A&B, Covid) Anterior Nasal Swab     Status: None   Collection Time: 06/27/23  4:11 PM   Specimen: Anterior Nasal Swab  Result Value Ref Range Status   SARS Coronavirus 2 by RT PCR NEGATIVE NEGATIVE Final   Influenza A by PCR NEGATIVE NEGATIVE Final   Influenza B by PCR NEGATIVE NEGATIVE Final    Comment: (NOTE) The Xpert Xpress SARS-CoV-2/FLU/RSV plus assay is intended as an aid in the diagnosis  of influenza from Nasopharyngeal swab specimens and should not be used as a sole basis for treatment. Nasal washings and aspirates are unacceptable for Xpert Xpress SARS-CoV-2/FLU/RSV testing.  Fact Sheet for Patients: BloggerCourse.com  Fact Sheet for Healthcare Providers: SeriousBroker.it  This test is not yet approved or cleared by the [place]  FDA and has been authorized for detection and/or diagnosis of SARS-CoV-2 by FDA under an Emergency Use Authorization (EUA). This EUA will remain in effect (meaning this test can be used) for the duration of the COVID-19 declaration under Section 564(b)(1) of the Act, 21 U.S.C. section 360bbb-3(b)(1), unless the authorization is terminated or revoked.     Resp Syncytial Virus by PCR NEGATIVE NEGATIVE Final    Comment: (NOTE) Fact Sheet for Patients: BloggerCourse.com  Fact Sheet for Healthcare Providers: SeriousBroker.it  This test is not yet approved or cleared by the [place]  FDA and has been authorized for detection and/or diagnosis of SARS-CoV-2 by FDA under an Emergency Use Authorization (EUA). This EUA will remain in effect (meaning this test can be used) for the duration of the COVID-19 declaration under Section 564(b)(1) of the Act, 21 U.S.C. section 360bbb-3(b)(1), unless the authorization is terminated or revoked.  Performed at [facility] Lab, [address], [place], [place] [address]   Urine Culture     Status: Abnormal   Collection Time: 06/27/23  5:51 PM   Specimen: Urine, Random  Result Value Ref Range Status   Specimen Description URINE, RANDOM  Final   Special Requests   Final    NONE Reflexed from [id number] Performed at [facility], [address], [place], [place] [address]    Culture >=100,000 COLONIES/mL ESCHERICHIA COLI (A)  Final   Report Status 06/29/2023 FINAL  Final   Organism ID,  Bacteria ESCHERICHIA COLI (A)  Final      Susceptibility   Escherichia coli - MIC*    AMPICILLIN >=32 RESISTANT Resistant     CEFAZOLIN  <=4 SENSITIVE Sensitive  CEFEPIME  <=0.12 SENSITIVE Sensitive     CEFTRIAXONE  <=0.25 SENSITIVE Sensitive     CIPROFLOXACIN  1 RESISTANT Resistant     GENTAMICIN <=1 SENSITIVE Sensitive     IMIPENEM <=0.25 SENSITIVE Sensitive     NITROFURANTOIN  <=16 SENSITIVE Sensitive     TRIMETH/SULFA >=320 RESISTANT Resistant     AMPICILLIN/SULBACTAM 16 INTERMEDIATE Intermediate     PIP/TAZO <=4 SENSITIVE Sensitive ug/mL    * >=100,000 COLONIES/mL ESCHERICHIA COLI  Wet prep, genital     Status: None   Collection Time: 07/06/23  3:00 PM   Specimen: Vaginal  Result Value Ref Range Status   Yeast Wet Prep HPF POC NONE SEEN NONE SEEN Final   Trich, Wet Prep NONE SEEN NONE SEEN Final   Clue Cells Wet Prep HPF POC NONE SEEN NONE SEEN Final   WBC, Wet Prep HPF POC <10 <10 Final   Sperm NONE SEEN  Final    Comment: Performed at [facility] Lab, [address] [place], [place] [id number]     Time coordinating discharge: Over 30 minutes  SIGNED:   Camellia PARAS [place], DO  [facility] 07/07/2023, 10:26 AM

## 2023-07-07 NOTE — Progress Notes (Signed)
 [facility] Progress Note   Subjective:  Seen on HD - 2L UFG and tolerating. Some nausea and back pain this AM - resting comfortably now. No CP/dyspnea  Objective Vitals:   07/07/23 0850 07/07/23 0900 07/07/23 0930 07/07/23 1000  BP: 125/88 130/73 121/68 132/70  Pulse: 71 72 71 72  Resp: 16 15 13 13   Temp:      TempSrc:      SpO2: 96% 96% 97% 100%  Weight:      Height:       Physical Exam General: Well appearing woman, NAD Heart: RRR; no murmur Lungs: CTAB Abdomen: soft Extremities: no LE edema Dialysis Access: [facility]  Additional Objective Labs: Basic Metabolic Panel: Recent Labs  Lab 07/01/23 0816 07/03/23 0601 07/04/23 0523 07/05/23 0530 07/07/23 0820  NA 134*   < > 131* 132* 127*  K 3.7   < > 4.2 4.2 4.3  CL 99   < > 99 98 93*  CO2 25   < > 27 26 25   GLUCOSE 119*   < > 92 96 116*  BUN 36*   < > 53* 68* 56*  CREATININE 3.71*   < > 4.15* 4.87* 4.52*  CALCIUM  8.6*   < > 8.8* 8.8* 9.2  PHOS 3.8  --   --   --  4.3   < > = values in this interval not displayed.   Liver Function Tests: Recent Labs  Lab 07/01/23 0816 07/04/23 1356 07/07/23 0820  BILITOT  --  0.3  --   ALBUMIN  2.6*  --  2.9*   CBC: Recent Labs  Lab 07/03/23 0601 07/03/23 1450 07/04/23 0523 07/05/23 0530 07/07/23 0820  WBC 5.3 6.6 6.6 6.3 6.6  HGB 9.2* 9.1* 8.4* 7.9* 7.7*  HCT 29.5* 29.3* 28.2* 26.5* 25.0*  MCV 94.9 93.9 97.2 98.1 95.4  PLT 289 297 277 275 271   Blood Culture    Component Value Date/Time   SDES URINE, RANDOM 06/27/2023 1751   SPECREQUEST  06/27/2023 1751    NONE Reflexed from [id number] Performed at [facility], [address], [place], [place] [address]    CULT >=100,000 COLONIES/mL ESCHERICHIA COLI (A) 06/27/2023 1751   REPTSTATUS 06/29/2023 FINAL 06/27/2023 1751   Cardiac Enzymes: Recent Labs  Lab 07/04/23 1356  CKTOTAL 82   Medications:   amLODipine   10 mg Oral Daily   atorvastatin   20 mg Oral Daily    budesonide -glycopyrrolate -formoterol   2 puff Inhalation BID   carvedilol   12.5 mg Oral BID   Chlorhexidine  Gluconate Cloth  6 each Topical Daily   Chlorhexidine  Gluconate Cloth  6 each Topical Q0600   dicyclomine   10 mg Oral TID   diphenhydrAMINE   50 mg Oral TID   DULoxetine   30 mg Oral Daily   escitalopram   10 mg Oral Daily   folic acid   1 mg Oral Daily   gabapentin   100 mg Oral QHS   heparin   5,000 Units Subcutaneous Q8H   lamoTRIgine   100 mg Oral Daily   metolazone   2.5 mg Oral Q M,W,F   mirtazapine   15 mg Oral QHS   pantoprazole   40 mg Oral Daily   polyethylene glycol  17 g Oral Daily   QUEtiapine   50 mg Oral BID   sodium chloride  flush  10-40 mL Intracatheter Q12H    Dialysis Orders TTS - [facility]  Assessment/Plan: UTI: Urine Cx + E.Coli - on abx. ESRD: Wants to change HD units but unable to make happen  this admit - will have outpatient HD unit work on this. Continue HD on TTS schedule for now - HD now, 2L UFG. HTN/volume: BP stable, 2L UFG Anemia of ESRD: Hgb 7.7 - continue ^ ESA dose as outpatient. Secondary HPTH: Ca/Phos ok - not on binder or VDRA at this time Nutrition: Alb low, continue renal diet Dispo: Likely later today   Izetta Boehringer, PA-C 07/07/2023, 10:29 AM  [facility]

## 2023-07-07 NOTE — Progress Notes (Signed)
 DISCHARGE NOTE HOME Tiffany [name] to be discharged Home per MD order. Discussed prescriptions and follow up appointments with the patient. Prescriptions given to patient; medication list explained in detail. Patient verbalized understanding.  Skin clean, dry and intact without evidence of skin break down, no evidence of skin tears noted. IV catheter discontinued intact. Site without signs and symptoms of complications. Dressing and pressure applied. Pt denies pain at the site currently. No complaints noted.  Patient free of lines, drains, and wounds.   An After Visit Summary (AVS) was printed and given to the patient. Patient escorted via wheelchair, and discharged home via private auto.  Doyal Sias, RN

## 2023-07-07 NOTE — Plan of Care (Signed)
  Problem: Clinical Measurements: Goal: Diagnostic test results will improve Outcome: Progressing Goal: Signs and symptoms of infection will decrease Outcome: Progressing   Problem: Respiratory: Goal: Ability to maintain adequate ventilation will improve Outcome: Progressing   Problem: Fluid Volume: Goal: Hemodynamic stability will improve Outcome: Progressing   Problem: Clinical Measurements: Goal: Diagnostic test results will improve Outcome: Progressing Goal: Signs and symptoms of infection will decrease Outcome: Progressing   Problem: Respiratory: Goal: Ability to maintain adequate ventilation will improve Outcome: Progressing   Problem: Education: Goal: Knowledge of General Education information will improve Description: Including pain rating scale, medication(s)/side effects and non-pharmacologic comfort measures Outcome: Progressing   Problem: Health Behavior/Discharge Planning: Goal: Ability to manage health-related needs will improve Outcome: Progressing   Problem: Activity: Goal: Risk for activity intolerance will decrease Outcome: Progressing   Problem: Nutrition: Goal: Adequate nutrition will be maintained Outcome: Progressing   Problem: Coping: Goal: Level of anxiety will decrease Outcome: Progressing   Problem: Elimination: Goal: Will not experience complications related to bowel motility Outcome: Progressing Goal: Will not experience complications related to urinary retention Outcome: Progressing   Problem: Pain Managment: Goal: General experience of comfort will improve and/or be controlled Outcome: Progressing   Problem: Safety: Goal: Ability to remain free from injury will improve Outcome: Progressing   Problem: Skin Integrity: Goal: Risk for impaired skin integrity will decrease Outcome: Progressing

## 2023-07-07 NOTE — Progress Notes (Signed)
   07/07/23 1235  Vitals  Temp 98.2 F (36.8 C)  Pulse Rate 75  Resp 16  BP 116/67  SpO2 98 %  O2 Device Room Air  Weight 87 kg  Type of Weight Post-Dialysis  Oxygen  Therapy  Patient Activity (if Appropriate) In bed  Post Treatment  Dialyzer Clearance Heavily streaked  Hemodialysis Intake (mL) 0 mL  Liters Processed 83.9  Fluid Removed (mL) 2000 mL  Tolerated HD Treatment Yes   Received patient in bed to unit.  Alert and oriented.  Informed consent signed and in chart.   TX duration:3.5hrs  Patient tolerated well.  Transported back to the room  Alert, without acute distress.  Hand-off given to patient's nurse.   Access used: [facility] Access issues: none  Total UF removed: 2L Medication(s) given: none   Na'Shaminy T Agripina [facility]

## 2023-07-08 ENCOUNTER — Telehealth (HOSPITAL_COMMUNITY): Payer: Self-pay | Admitting: Nephrology

## 2023-07-08 NOTE — Telephone Encounter (Signed)
 Transition of care contact from inpatient facility  Date of Discharge: 07/07/23 Date of Contact: 07/08/23 - attempt #1 Method of contact: Phone  Attempted to contact patient to discuss transition of care from inpatient admission. Patient did not answer the phone. Message was left on the patient's voicemail with call back number [phone].   Izetta Boehringer, PA-C [employer] Pager [phone]

## 2023-07-09 NOTE — Progress Notes (Addendum)
 Late Note Entry- July 09, 2023  Contacted [facility] to be advised of pt's d/c date on Saturday. Orders sent to clinic by providers in the event pt shows up for HD tomorrow at clinic. Referral to [facility] is still pending at this time. Pt was denied by [facility] providers.   Randine Mungo Renal Navigator [phone]  Addendum at 12:11 pm on 07/09/23: Spoke to [facility] admissions and pt's referral is still pending/under review.

## 2023-07-10 ENCOUNTER — Other Ambulatory Visit: Payer: Self-pay

## 2023-07-10 ENCOUNTER — Encounter (HOSPITAL_COMMUNITY): Payer: Self-pay

## 2023-07-10 ENCOUNTER — Inpatient Hospital Stay (HOSPITAL_COMMUNITY)
Admission: EM | Admit: 2023-07-10 | Discharge: 2023-07-12 | DRG: 291 | Disposition: A | Discharge status: Home / Self Care | Attending: Internal Medicine | Admitting: Internal Medicine

## 2023-07-10 DIAGNOSIS — Z87891 Personal history of nicotine dependence: Secondary | ICD-10-CM

## 2023-07-10 DIAGNOSIS — E1140 Type 2 diabetes mellitus with diabetic neuropathy, unspecified: Secondary | ICD-10-CM | POA: Diagnosis present

## 2023-07-10 DIAGNOSIS — Z91048 Other nonmedicinal substance allergy status: Secondary | ICD-10-CM

## 2023-07-10 DIAGNOSIS — E872 Acidosis, unspecified: Secondary | ICD-10-CM | POA: Diagnosis present

## 2023-07-10 DIAGNOSIS — E8770 Fluid overload, unspecified: Secondary | ICD-10-CM | POA: Diagnosis not present

## 2023-07-10 DIAGNOSIS — E66812 Obesity, class 2: Secondary | ICD-10-CM | POA: Diagnosis present

## 2023-07-10 DIAGNOSIS — Z888 Allergy status to other drugs, medicaments and biological substances status: Secondary | ICD-10-CM

## 2023-07-10 DIAGNOSIS — K219 Gastro-esophageal reflux disease without esophagitis: Secondary | ICD-10-CM | POA: Diagnosis present

## 2023-07-10 DIAGNOSIS — N2581 Secondary hyperparathyroidism of renal origin: Secondary | ICD-10-CM | POA: Diagnosis present

## 2023-07-10 DIAGNOSIS — F250 Schizoaffective disorder, bipolar type: Secondary | ICD-10-CM | POA: Diagnosis present

## 2023-07-10 DIAGNOSIS — E8779 Other fluid overload: Secondary | ICD-10-CM

## 2023-07-10 DIAGNOSIS — N186 End stage renal disease: Secondary | ICD-10-CM | POA: Diagnosis present

## 2023-07-10 DIAGNOSIS — E871 Hypo-osmolality and hyponatremia: Principal | ICD-10-CM | POA: Diagnosis present

## 2023-07-10 DIAGNOSIS — J439 Emphysema, unspecified: Secondary | ICD-10-CM | POA: Diagnosis present

## 2023-07-10 DIAGNOSIS — E1122 Type 2 diabetes mellitus with diabetic chronic kidney disease: Secondary | ICD-10-CM | POA: Diagnosis present

## 2023-07-10 DIAGNOSIS — J9611 Chronic respiratory failure with hypoxia: Secondary | ICD-10-CM | POA: Diagnosis present

## 2023-07-10 DIAGNOSIS — F419 Anxiety disorder, unspecified: Secondary | ICD-10-CM | POA: Diagnosis present

## 2023-07-10 DIAGNOSIS — G629 Polyneuropathy, unspecified: Secondary | ICD-10-CM | POA: Diagnosis present

## 2023-07-10 DIAGNOSIS — G40909 Epilepsy, unspecified, not intractable, without status epilepticus: Secondary | ICD-10-CM | POA: Diagnosis present

## 2023-07-10 DIAGNOSIS — Z885 Allergy status to narcotic agent status: Secondary | ICD-10-CM

## 2023-07-10 DIAGNOSIS — F32A Depression, unspecified: Secondary | ICD-10-CM | POA: Diagnosis present

## 2023-07-10 DIAGNOSIS — I252 Old myocardial infarction: Secondary | ICD-10-CM | POA: Diagnosis not present

## 2023-07-10 DIAGNOSIS — I132 Hypertensive heart and chronic kidney disease with heart failure and with stage 5 chronic kidney disease, or end stage renal disease: Principal | ICD-10-CM | POA: Diagnosis present

## 2023-07-10 DIAGNOSIS — Z8673 Personal history of transient ischemic attack (TIA), and cerebral infarction without residual deficits: Secondary | ICD-10-CM | POA: Diagnosis not present

## 2023-07-10 DIAGNOSIS — G4733 Obstructive sleep apnea (adult) (pediatric): Secondary | ICD-10-CM | POA: Diagnosis present

## 2023-07-10 DIAGNOSIS — Z886 Allergy status to analgesic agent status: Secondary | ICD-10-CM

## 2023-07-10 DIAGNOSIS — Z6837 Body mass index (BMI) 37.0-37.9, adult: Secondary | ICD-10-CM

## 2023-07-10 DIAGNOSIS — F4312 Post-traumatic stress disorder, chronic: Secondary | ICD-10-CM | POA: Diagnosis present

## 2023-07-10 DIAGNOSIS — Z992 Dependence on renal dialysis: Secondary | ICD-10-CM | POA: Diagnosis not present

## 2023-07-10 DIAGNOSIS — I5032 Chronic diastolic (congestive) heart failure: Secondary | ICD-10-CM | POA: Diagnosis present

## 2023-07-10 DIAGNOSIS — Z91010 Allergy to peanuts: Secondary | ICD-10-CM

## 2023-07-10 DIAGNOSIS — G894 Chronic pain syndrome: Secondary | ICD-10-CM | POA: Diagnosis present

## 2023-07-10 DIAGNOSIS — D631 Anemia in chronic kidney disease: Secondary | ICD-10-CM | POA: Diagnosis present

## 2023-07-10 DIAGNOSIS — E785 Hyperlipidemia, unspecified: Secondary | ICD-10-CM | POA: Diagnosis present

## 2023-07-10 DIAGNOSIS — Z882 Allergy status to sulfonamides status: Secondary | ICD-10-CM

## 2023-07-10 DIAGNOSIS — Z8249 Family history of ischemic heart disease and other diseases of the circulatory system: Secondary | ICD-10-CM

## 2023-07-10 DIAGNOSIS — E8809 Other disorders of plasma-protein metabolism, not elsewhere classified: Secondary | ICD-10-CM | POA: Diagnosis present

## 2023-07-10 DIAGNOSIS — M109 Gout, unspecified: Secondary | ICD-10-CM | POA: Diagnosis present

## 2023-07-10 DIAGNOSIS — Z79899 Other long term (current) drug therapy: Secondary | ICD-10-CM

## 2023-07-10 LAB — CBC WITH DIFFERENTIAL/PLATELET
Abs Immature Granulocytes: 0.1 10*3/uL — ABNORMAL HIGH (ref 0.00–0.07)
Basophils Absolute: 0 10*3/uL (ref 0.0–0.1)
Basophils Relative: 0 %
Eosinophils Absolute: 0.3 10*3/uL (ref 0.0–0.5)
Eosinophils Relative: 4 %
HCT: 25.4 % — ABNORMAL LOW (ref 36.0–46.0)
Hemoglobin: 8 g/dL — ABNORMAL LOW (ref 12.0–15.0)
Immature Granulocytes: 1 %
Lymphocytes Relative: 17 %
Lymphs Abs: 1.7 10*3/uL (ref 0.7–4.0)
MCH: 29.5 pg (ref 26.0–34.0)
MCHC: 31.5 g/dL (ref 30.0–36.0)
MCV: 93.7 fL (ref 80.0–100.0)
Monocytes Absolute: 0.5 10*3/uL (ref 0.1–1.0)
Monocytes Relative: 6 %
Neutro Abs: 7 10*3/uL (ref 1.7–7.7)
Neutrophils Relative %: 72 %
Platelets: 305 10*3/uL (ref 150–400)
RBC: 2.71 MIL/uL — ABNORMAL LOW (ref 3.87–5.11)
RDW: 14.1 % (ref 11.5–15.5)
WBC: 9.7 10*3/uL (ref 4.0–10.5)
nRBC: 0 % (ref 0.0–0.2)

## 2023-07-10 LAB — BASIC METABOLIC PANEL WITH GFR
Anion gap: 16 — ABNORMAL HIGH (ref 5–15)
BUN: 49 mg/dL — ABNORMAL HIGH (ref 8–23)
CO2: 21 mmol/L — ABNORMAL LOW (ref 22–32)
Calcium: 8.9 mg/dL (ref 8.9–10.3)
Chloride: 85 mmol/L — ABNORMAL LOW (ref 98–111)
Creatinine, Ser: 5.01 mg/dL — ABNORMAL HIGH (ref 0.44–1.00)
GFR, Estimated: 9 mL/min — ABNORMAL LOW (ref >60)
Glucose, Bld: 93 mg/dL (ref 70–99)
Potassium: 3.1 mmol/L — ABNORMAL LOW (ref 3.5–5.1)
Sodium: 122 mmol/L — ABNORMAL LOW (ref 135–145)

## 2023-07-10 MED ORDER — HEPARIN SODIUM (PORCINE) 1000 UNIT/ML DIALYSIS
1000.0000 [IU] | INTRAMUSCULAR | Status: DC | PRN
  Administered 2023-07-10: 3200 [IU]

## 2023-07-10 MED ORDER — MIRTAZAPINE 15 MG PO TABS
15.0000 mg | ORAL_TABLET | Freq: Every day | ORAL | Status: DC
  Administered 2023-07-10 – 2023-07-11 (×2): 15 mg via ORAL
  Filled 2023-07-10 (×2): qty 1

## 2023-07-10 MED ORDER — ATORVASTATIN CALCIUM 10 MG PO TABS
20.0000 mg | ORAL_TABLET | Freq: Every day | ORAL | Status: DC
  Administered 2023-07-12: 20 mg via ORAL
  Filled 2023-07-10: qty 2

## 2023-07-10 MED ORDER — DULOXETINE HCL 30 MG PO CPEP
30.0000 mg | ORAL_CAPSULE | Freq: Every day | ORAL | Status: DC
  Administered 2023-07-11 – 2023-07-12 (×2): 30 mg via ORAL
  Filled 2023-07-10 (×2): qty 1

## 2023-07-10 MED ORDER — CHLORHEXIDINE GLUCONATE CLOTH 2 % EX PADS: 6.0000 | MEDICATED_PAD | Freq: Every day | CUTANEOUS | Status: DC

## 2023-07-10 MED ORDER — BUDESON-GLYCOPYRROL-FORMOTEROL 160-9-4.8 MCG/ACT IN AERO
2.0000 | INHALATION_SPRAY | Freq: Two times a day (BID) | RESPIRATORY_TRACT | Status: DC
  Administered 2023-07-11 – 2023-07-12 (×3): 2 via RESPIRATORY_TRACT
  Filled 2023-07-10: qty 5.9

## 2023-07-10 MED ORDER — AMLODIPINE BESYLATE 5 MG PO TABS
5.0000 mg | ORAL_TABLET | Freq: Two times a day (BID) | ORAL | Status: DC
Start: 2023-07-10 — End: 2023-07-12
  Administered 2023-07-10 – 2023-07-12 (×3): 5 mg via ORAL
  Filled 2023-07-10 (×3): qty 1

## 2023-07-10 MED ORDER — ESCITALOPRAM OXALATE 10 MG PO TABS
10.0000 mg | ORAL_TABLET | Freq: Every day | ORAL | Status: DC
  Administered 2023-07-11 – 2023-07-12 (×2): 10 mg via ORAL
  Filled 2023-07-10 (×2): qty 1

## 2023-07-10 MED ORDER — LAMOTRIGINE 25 MG PO TABS
100.0000 mg | ORAL_TABLET | Freq: Every day | ORAL | Status: DC
  Administered 2023-07-10 – 2023-07-11 (×2): 100 mg via ORAL
  Filled 2023-07-10 (×2): qty 4

## 2023-07-10 MED ORDER — PRAZOSIN HCL 2 MG PO CAPS
2.0000 mg | ORAL_CAPSULE | Freq: Every day | ORAL | Status: DC
  Administered 2023-07-10 – 2023-07-11 (×2): 2 mg via ORAL
  Filled 2023-07-10 (×3): qty 1

## 2023-07-10 MED ORDER — HEPARIN SODIUM (PORCINE) 5000 UNIT/ML IJ SOLN
5000.0000 [IU] | Freq: Three times a day (TID) | INTRAMUSCULAR | Status: DC
  Administered 2023-07-10 – 2023-07-12 (×4): 5000 [IU] via SUBCUTANEOUS
  Filled 2023-07-10 (×5): qty 1

## 2023-07-10 MED ORDER — EZETIMIBE 10 MG PO TABS
10.0000 mg | ORAL_TABLET | Freq: Every day | ORAL | Status: DC
  Administered 2023-07-11 – 2023-07-12 (×2): 10 mg via ORAL
  Filled 2023-07-10 (×2): qty 1

## 2023-07-10 MED ORDER — PANTOPRAZOLE SODIUM 40 MG PO TBEC
40.0000 mg | DELAYED_RELEASE_TABLET | Freq: Every day | ORAL | Status: DC
  Administered 2023-07-11 – 2023-07-12 (×2): 40 mg via ORAL
  Filled 2023-07-10 (×2): qty 1

## 2023-07-10 MED ORDER — CARVEDILOL 12.5 MG PO TABS
12.5000 mg | ORAL_TABLET | Freq: Two times a day (BID) | ORAL | Status: DC
  Administered 2023-07-10 – 2023-07-12 (×3): 12.5 mg via ORAL
  Filled 2023-07-10 (×3): qty 1

## 2023-07-10 MED ORDER — GABAPENTIN 100 MG PO CAPS
100.0000 mg | ORAL_CAPSULE | Freq: Every day | ORAL | Status: DC
  Administered 2023-07-10 – 2023-07-11 (×2): 100 mg via ORAL
  Filled 2023-07-10 (×2): qty 1

## 2023-07-10 MED ORDER — IPRATROPIUM-ALBUTEROL 0.5-2.5 (3) MG/3ML IN SOLN: 3.0000 mL | Freq: Four times a day (QID) | RESPIRATORY_TRACT | Status: DC | PRN

## 2023-07-10 MED ORDER — QUETIAPINE FUMARATE 25 MG PO TABS
50.0000 mg | ORAL_TABLET | Freq: Two times a day (BID) | ORAL | Status: DC
  Administered 2023-07-10 – 2023-07-12 (×3): 50 mg via ORAL
  Filled 2023-07-10 (×3): qty 2

## 2023-07-10 MED ORDER — CHLORHEXIDINE GLUCONATE CLOTH 2 % EX PADS
6.0000 | MEDICATED_PAD | Freq: Every day | CUTANEOUS | Status: DC
  Administered 2023-07-11 – 2023-07-12 (×2): 6 via TOPICAL

## 2023-07-10 MED ORDER — HEPARIN SODIUM (PORCINE) 1000 UNIT/ML IJ SOLN
INTRAMUSCULAR | Status: AC
Start: 2023-07-10 — End: 2023-07-10
  Filled 2023-07-10: qty 4

## 2023-07-10 MED ORDER — CALCITRIOL 0.5 MCG PO CAPS
0.5000 ug | ORAL_CAPSULE | Freq: Every day | ORAL | Status: DC
  Administered 2023-07-12: 0.5 ug via ORAL
  Filled 2023-07-10 (×3): qty 1

## 2023-07-10 NOTE — Plan of Care (Signed)
   Problem: Elimination: Goal: Will not experience complications related to bowel motility Outcome: Progressing Goal: Will not experience complications related to urinary retention Outcome: Progressing   Problem: Pain Managment: Goal: General experience of comfort will improve and/or be controlled Outcome: Progressing   Problem: Safety: Goal: Ability to remain free from injury will improve Outcome: Progressing   Problem: Skin Integrity: Goal: Risk for impaired skin integrity will decrease Outcome: Progressing

## 2023-07-10 NOTE — H&P (Signed)
 History and Physical    Patient: Tiffany [name] FMW:[id number] DOB: 07-05-1960 DOA: 07/10/2023 DOS: the patient was seen and examined on 07/10/2023 PCP: Tiffany Crigler, MD  Patient coming from: Home  Chief Complaint:  Chief Complaint  Patient presents with   DIALYSIS   HPI: Tiffany [name] is a 63 y.o. female with medical history significant of ESRD on HD TTS, chronic diastolic congestive heart failure, schizoaffective disorder, anemia of chronic medical/renal disease, OSA, GERD, neuropathy, anxiety/depression, active tobacco abuse, history of seizure disorder, and recent admission on 07/07/2023 for E. Coli and UTI and HD at that time p/w BLE swelling and hyponatremia c/w volume overload and need for HD.  Pt states that she feels OK and presented today for her regular HD as she does not feel comfortable returning to her previous dialysis center for unclear reasons. Pt endorses BLE swelling and SOB, but no cp.  In the ED, pt AFVSS. Labs notable for Na 122, K 3.1, BUN/Cr 49/5.01, anion gap 16, and Hb 8. Pt admitted to medicine with renal following for HD.  Review of Systems: As mentioned in the history of present illness. All other systems reviewed and are negative. Past Medical History:  Diagnosis Date   Abdominal pain    Acute encephalopathy 11/21/2017   Acute exacerbation of CHF (congestive heart failure) (HCC) 03/03/2022   Acute GI bleeding 03/29/2021   Acute metabolic encephalopathy 02/20/2020   Aggressive behavior    Agitation 11/22/2017   Anoxic brain injury (HCC) 09/08/2016   C. Arrest due to respiratory failure and COPD exacerbation   Anxiety    Arthritis    all over (04/10/2016)   Asthma 10/18/2010   Binge eating disorder    Blood loss anemia 04/24/2011   CAP (community acquired pneumonia) 06/22/2015   Cardiac arrest (HCC) 09/08/2016   PEA   Carotid artery stenosis    1-39% bilateral by dopplers 11/2016   Chronic diastolic (congestive) heart failure (HCC)    Chronic  pain syndrome 06/18/2012   Chronic post-traumatic stress disorder (PTSD) 05/27/2018   Chronic respiratory failure with hypoxia and hypercapnia (HCC) 06/22/2015   TRILOGY Vent >AVAPA-ES., Vt target 200-400, Max P 30 , PS max 20 , PS min 6-10 , E Max 6, E Min 4, Rate Auto AVAPS Rate 2 (titrate for pt comfort) , bleed O2 at 5l/m continuous flow .    Closed displaced fracture of fifth metacarpal bone 03/21/2018   Cocaine use disorder, severe, in sustained remission (HCC) 12/17/2015   Complication of anesthesia    decreased bp, decreased heart rate   COPD (chronic obstructive pulmonary disease) (HCC) 07/08/2014   Delusional disorder, persecutory type (HCC) 06/26/2021   Depression    Diabetic neuropathy (HCC) 04/24/2011   Difficulty with speech 01/24/2018   Disorder of nervous system    Drug abuse (HCC) 11/21/2017   Dyslipidemia 04/24/2011   E. coli UTI 02/20/2020   Elevated troponin 04/28/2012   Emphysema    Encephalopathy 11/21/2017   Essential hypertension 03/22/2016   Facet arthropathy, lumbar 06/12/2022   Fibula fracture 07/10/2016   Frequent falls 10/11/2017   GERD (gastroesophageal reflux disease)    GI bleed 03/30/2021   Gout 04/11/2017   Heart attack (HCC) 1980s   Hematochezia    History of blood transfusion 1994   couldn't stop bleeding from my period   History of drug abuse in remission (HCC) 11/28/2015   Quit in 2017   Hyperlipidemia 03/31/2021   Hyperlipidemia LDL goal <70    Hypocalcemia  Hypokalemia    Hypomagnesemia    Incontinence    Manic depression (HCC)    Morbid obesity (HCC) 10/18/2010   Obstructive sleep apnea 10/18/2010   On home oxygen  therapy    6L; 24/7 (04/10/2016)   OSA on CPAP    wear mask sometimes (04/10/2016)   Painless rectal bleeding 12/31/2015   Paranoid (HCC)    sometimes; I'm on RX for it (04/10/2016)   Periumbilical abdominal pain    Prolonged Q-T interval on ECG    Rectal bleeding 12/31/2015   Recurrent syncope 07/09/2021    Rhabdomyolysis 06/16/2021   Schizoaffective disorder, bipolar type (HCC) 04/05/2018   Seasonal allergies    Seborrheic keratoses 12/31/2013   Seizures (HCC)    don't know what kind; last one was ~ 1 yr ago (04/10/2016)   Sinus bradycardia    Skin ulcer of sacrum, limited to breakdown of skin (HCC) 03/08/2022   Spondylolisthesis at L3-L4 level 06/12/2022   Stroke (HCC) 1980s   denies residual on 04/10/2016   Thrush 09/19/2013   Type 2 diabetes mellitus (HCC) 10/18/2010   Past Surgical History:  Procedure Laterality Date   A/V FISTULAGRAM N/A 02/01/2023   Procedure: A/V Fistulagram;  Surgeon: Norine Manuelita LABOR, MD;  Location: [facility] INVASIVE CV LAB;  Service: Cardiovascular;  Laterality: N/A;   AV FISTULA PLACEMENT Left 12/21/2022   Procedure: INSERTION OF LEFT ARM ARTERIOVENOUS (AV) GORE-TEX GRAFT;  Surgeon: Magda Debby SAILOR, MD;  Location: [facility] OR;  Service: Vascular;  Laterality: Left;   CESAREAN SECTION  1997   COLONOSCOPY WITH PROPOFOL  N/A 04/01/2021   Procedure: COLONOSCOPY WITH PROPOFOL ;  Surgeon: Rollin Dover, MD;  Location: [facility] ENDOSCOPY;  Service: Gastroenterology;  Laterality: N/A;  Rectal bleeding with drop in hemoglobin to 7.2 g/dL   HERNIA REPAIR     IR CHOLANGIOGRAM EXISTING TUBE  07/20/2016   IR PERC CHOLECYSTOSTOMY  05/10/2016   IR RADIOLOGIST EVAL & MGMT  06/08/2016   IR RADIOLOGIST EVAL & MGMT  06/29/2016   IR SINUS/FIST TUBE CHK-NON GI  07/12/2016   RIGHT/LEFT HEART CATH AND CORONARY ANGIOGRAPHY N/A 06/19/2017   Procedure: RIGHT/LEFT HEART CATH AND CORONARY ANGIOGRAPHY;  Surgeon: Cherrie Toribio SAUNDERS, MD;  Location: [facility] INVASIVE CV LAB;  Service: Cardiovascular;  Laterality: N/A;   TIBIA IM NAIL INSERTION Right 07/12/2016   Procedure: INTRAMEDULLARY (IM) NAIL RIGHT TIBIA;  Surgeon: Jerri Kay HERO, MD;  Location: [facility] OR;  Service: Orthopedics;  Laterality: Right;   UMBILICAL HERNIA REPAIR  ~ 1963   that's why I don't have a belly button   VAGINAL HYSTERECTOMY     Social  History:  reports that she quit smoking about 7 years ago. Her smoking use included cigarettes. She started smoking about 46 years ago. She has a 58.6 pack-year smoking history. She has been exposed to tobacco smoke. She has never used smokeless tobacco. She reports that she does not currently use drugs after having used the following drugs: Cocaine. She reports that she does not drink alcohol.  Allergies  Allergen Reactions   Dorethia Georges ] Other (See Comments)    GI bleed   Deltasone  [Prednisone ] Anaphylaxis, Swelling and Other (See Comments)    Swelling of tongue, lips, throat   Latuda [Lurasidone Hcl] Anaphylaxis   Lortab [Hydrocodone -Acetaminophen ] Hives and Shortness Of Breath   Magnesium -Containing Compounds Anaphylaxis and Other (See Comments)    Tolerated Ensure   Ultram  [Tramadol ] Anaphylaxis, Swelling and Rash   Codeine Nausea And Vomiting and Rash   Desyrel  [Trazodone ] Other (See  Comments)    Paranoia    Apresoline  [Hydralazine ] Other (See Comments)    Muscle spasms   Peanut (Diagnostic) Hives and Other (See Comments)    Raw peanuts only  OK to eat peanut butter.   Topamax  [Topiramate ] Other (See Comments)    Paranoia   Sulfa Antibiotics Itching   Tape Rash    Family History  Problem Relation Age of Onset   Cancer Mother        lung   Depression Mother    Cancer Father        prostate   Depression Sister    Anxiety disorder Sister    Schizophrenia Sister    Bipolar disorder Sister    Depression Sister    Depression Brother    Heart failure Other        cousin    Prior to Admission medications   Medication Sig Start Date End Date Taking? Authorizing Provider  albuterol  (VENTOLIN  HFA) 108 (90 Base) MCG/ACT inhaler Inhale 2 puffs into the lungs every 6 (six) hours as needed for wheezing or shortness of breath. 07/07/23   [place], Camellia PARAS, DO  amLODipine  (NORVASC ) 10 MG tablet Take 0.5 tablets (5 mg total) by mouth 2 (two) times daily. 07/07/23 10/05/23  [place],  Camellia PARAS, DO  Asenapine  Maleate (SAPHRIS ) 10 MG SUBL Place 10 mg under the tongue at bedtime.    [provider]  atorvastatin  (LIPITOR ) 20 MG tablet Take 1 tablet (20 mg total) by mouth daily. 07/07/23 10/05/23  [place], Eric J, DO  calcitRIOL  (ROCALTROL ) 0.5 MCG capsule Take 1 capsule (0.5 mcg total) by mouth daily. 07/07/23   [place], Camellia PARAS, DO  carvedilol  (COREG ) 12.5 MG tablet Take 1 tablet (12.5 mg total) by mouth 2 (two) times daily. 07/07/23 10/05/23  [place], Camellia PARAS, DO  cyclobenzaprine  (FLEXERIL ) 10 MG tablet Take 1 tablet (10 mg total) by mouth 3 (three) times daily. 10/09/22   McDiarmid, Krystal BIRCH, MD  dicyclomine  (BENTYL ) 10 MG capsule Take 1 capsule (10 mg total) by mouth 3 (three) times daily. 07/07/23   [place], Camellia PARAS, DO  DULoxetine  (CYMBALTA ) 30 MG capsule Take 1 capsule (30 mg total) by mouth daily. 07/07/23   [place], Camellia PARAS, DO  epoetin  alfa (EPOGEN ) 10000 UNIT/ML injection Inject 1 mL (10,000 Units total) into the vein every Monday, Wednesday, and Friday with hemodialysis. 11/22/22   Cam Charlie Loving, DO  escitalopram  (LEXAPRO ) 10 MG tablet Take 1 tablet (10 mg total) by mouth daily. 07/07/23 10/05/23  [place], Eric J, DO  ezetimibe  (ZETIA ) 10 MG tablet Take 1 tablet (10 mg total) by mouth daily. Please call office to schedule an appt for further refills. Thank you 07/07/23   [place], Camellia PARAS, DO  ferrous sulfate  325 (65 FE) MG tablet Take 1 tablet (325 mg total) by mouth daily. 07/07/23 07/01/24  [place], Eric J, DO  Fluticasone -Umeclidin-Vilant (TRELEGY ELLIPTA ) 100-62.5-25 MCG/ACT AEPB Inhale 1 puff into the lungs daily. 07/07/23   [place], Camellia PARAS, DO  folic acid  (FOLVITE ) 1 MG tablet Take 1 tablet (1 mg total) by mouth daily. 07/07/23   [place], Camellia PARAS, DO  gabapentin  (NEURONTIN ) 100 MG capsule Take 1 capsule (100 mg total) by mouth at bedtime. 07/07/23 10/05/23  [place], Camellia PARAS, DO  hydrOXYzine  (ATARAX ) 10 MG tablet Take 1 tablet (10 mg total) by mouth 3 (three) times daily as  needed for anxiety. Patient taking differently: Take 10 mg by mouth 3 (three) times daily. 03/14/23   Mannie,  Jerel PARAS, NP  ibuprofen  (ADVIL ) 200 MG tablet Take 200 mg by mouth every 6 (six) hours as needed for headache or moderate pain (pain score 4-6).    [provider]  lamoTRIgine  (LAMICTAL ) 100 MG tablet Take 1 tablet (100 mg total) by mouth at bedtime. 07/07/23 10/05/23  [place], Camellia PARAS, DO  lidocaine  (LIDODERM ) 5 % Place 1 patch onto the skin daily. Remove & Discard patch within 12 hours or as directed by MD Patient taking differently: Place 1 patch onto the skin daily as needed (pain). 02/20/23   Singh, Prashant K, MD  loperamide  (IMODIUM ) 2 MG capsule Take 1 capsule (2 mg total) by mouth 2 (two) times daily. Patient taking differently: Take 2 mg by mouth 3 (three) times daily. 10/09/22   McDiarmid, Krystal BIRCH, MD  MELATONIN PO Take 1 tablet by mouth at bedtime.    [provider]  metolazone  (ZAROXOLYN ) 2.5 MG tablet Take 1 tablet (2.5 mg total) by mouth every Monday, Wednesday, and Friday. 07/09/23 10/07/23  [place], Camellia PARAS, DO  mirtazapine  (REMERON ) 7.5 MG tablet Take 2 tablets (15 mg total) by mouth at bedtime. 07/07/23 10/05/23  [place], Eric J, DO  Multiple Vitamins-Minerals (CENTRUM SILVER  50+WOMEN) TABS Take 1 tablet by mouth at bedtime.    [provider]  omeprazole  (PRILOSEC) 40 MG capsule Take 1 capsule (40 mg total) by mouth every morning. 07/07/23 10/05/23  [place], Camellia PARAS, DO  prazosin  (MINIPRESS ) 2 MG capsule Take 1 capsule (2 mg total) by mouth at bedtime. 02/26/23     QUEtiapine  (SEROQUEL ) 50 MG tablet Take 1 tablet (50 mg total) by mouth 2 (two) times daily. 07/07/23 10/05/23  [place], Camellia PARAS, DO  torsemide  (DEMADEX ) 20 MG tablet Take 1 tablet (20 mg total) by mouth 2 (two) times daily. Patient taking differently: No sig reported 02/13/23       Physical Exam: Vitals:   07/10/23 0705 07/10/23 0707 07/10/23 1000 07/10/23 1026  BP: 120/72  130/89 130/89  Pulse:  77  79 79  Resp: 16   18  Temp: 98 F (36.7 C)     SpO2: 100%  100% 100%  Weight:  86.6 kg    Height:  5' 2 (1.575 m)     General: Alert, oriented x3, resting comfortably in no acute distress Respiratory: Lungs clear to auscultation bilaterally with normal respiratory effort; no w/r/r Cardiovascular: Regular rate and rhythm w/o m/r/g MSK: BLE swelling to calves   Data Reviewed:  Lab Results  Component Value Date   WBC 9.7 07/10/2023   HGB 8.0 (L) 07/10/2023   HCT 25.4 (L) 07/10/2023   MCV 93.7 07/10/2023   PLT 305 07/10/2023   Lab Results  Component Value Date   GLUCOSE 93 07/10/2023   CALCIUM  8.9 07/10/2023   NA 122 (L) 07/10/2023   K 3.1 (L) 07/10/2023   CO2 21 (L) 07/10/2023   CL 85 (L) 07/10/2023   BUN 49 (H) 07/10/2023   CREATININE 5.01 (H) 07/10/2023   Lab Results  Component Value Date   ALT 9 06/28/2023   AST 13 (L) 06/28/2023   GGT 54 (H) 10/29/2019   ALKPHOS 78 06/28/2023   BILITOT 0.3 07/04/2023   Lab Results  Component Value Date   INR 1.2 06/28/2023   INR 1.1 06/27/2023   INR 1.2 02/15/2023    Radiology: No results found.  Assessment and Plan: 18F h/o ESRD on HD TTS, chronic diastolic congestive heart failure, schizoaffective disorder, anemia of chronic medical/renal disease,  OSA, GERD, neuropathy, anxiety/depression, active tobacco abuse, history of seizure disorder, and recent admission on 07/07/2023 for E. Coli and UTI and HD at that time p/w BLE swelling and hyponatremia c/w volume overload and need for HD.  Volume overload 2/2 ESRD Hyponatremia H/o ESRD -Renal following; apprec eval/recs -PTA calcitriol  0.5mcg daily -Strict I&Os and daily weights (standing preferred) -Renally dose medications for CrCl -Avoid lovenox , NSAIDs, morphine , Fleet's phosphate enema, regular insulin , contrast; no gadolinium for MRI to avoid nephrogenic systemic fibrosis  HTN -PTA coreg , and amlodipine   Mood disorder -PTA cymbalta , lexapro , prazosin ,  seroquel  and lamictal   COPD -Breztri  and Duonebs   Advance Care Planning:   Code Status: Full Code   Consults: Renal  Family Communication: N/A  Severity of Illness: The appropriate patient status for this patient is INPATIENT. Inpatient status is judged to be reasonable and necessary in order to provide the required intensity of service to ensure the patient's safety. The patient's presenting symptoms, physical exam findings, and initial radiographic and laboratory data in the context of their chronic comorbidities is felt to place them at high risk for further clinical deterioration. Furthermore, it is not anticipated that the patient will be medically stable for discharge from the hospital within 2 midnights of admission.   * I certify that at the point of admission it is my clinical judgment that the patient will require inpatient hospital care spanning beyond 2 midnights from the point of admission due to high intensity of service, high risk for further deterioration and high frequency of surveillance required.*   ------- I spent 55 minutes reviewing previous labs/notes, obtaining separate history at the bedside, counseling/discussing the treatment plan outlined above, ordering medications/tests, and performing clinical documentation.  Author: Marsha Ada, MD 07/10/2023 1:53 PM  For on call review [email or web address].

## 2023-07-10 NOTE — ED Triage Notes (Signed)
Here for dialysis   No other complaints 

## 2023-07-10 NOTE — Progress Notes (Signed)
 HD Note:  Some information was entered later than the data was gathered due to patient care needs. The stated time with the data is accurate.  Received patient in bed to unit.   Alert and oriented.   Informed consent signed and in chart.   Access used: Right Chest [facility] Access issues: None  Patient tolerated treatment well.   TX duration: 3.5 hours  Alert, without acute distress.  Total UF removed: 3.1L  Hand-off given to patient's nurse.   Transported back to the room   Bobetta Lango RN [facility].

## 2023-07-10 NOTE — Progress Notes (Signed)
 Advised by [facility] staff this afternoon that pt was denied by [facility]. Pt's referral has been escalated to see if any other [facility] clinic would be able to accept pt. Update provided to nephrologist. Will assist as needed.   Randine Mungo Renal Navigator [phone]

## 2023-07-10 NOTE — ED Notes (Signed)
 Pt reports needing dialysis today. Pt reports some leg swelling.

## 2023-07-10 NOTE — Consult Note (Signed)
 Renal Service Consult Note [facility]  Tiffany [name] 07/10/2023 Tiffany [name], MD Requesting Physician: Dr. Ellouise  Reason for Consult: ESRD pt w/ missed OP HD and hyponatremia HPI: The patient is a 63 y.o. year-old w/ PMH as below who presented to ED saying she needs dialysis, complaining of swollen legs.  In the ED blood pressure 120/72, HR 78, RR 16, temp 98, 100% O2 sat on room air.  Serum sodium 122, K+ 3.1, BUN 49, creatinine 5.0, Ca 8.9, Hb 8.0, WBC 9.7.  Patient will need admission for fluid overload causing hyponatremia.  We are asked to see for dialysis.   Pt seen in ED.  Patient continues to refuse to go to her outpatient dialysis unit, she will not give a reason why.  Patient lives alone at home, states she gets around in a wheelchair and she has friends who help her with rides to the grocery store, etc.   Have spoken to  our HD SW, this patient has an outpatient HD unit which is [facility] on a TTS schedule that she can go to for dialysis, however, she continues to refuse to go to [facility] dialysis unit.  According to the rules of her dialysis unit, she is not allowed to request a change of dialysis through the hospital it must be done through the outpatient dialysis unit.  Our dialysis social worker has requested dialysis spot with [facility] which was denied.  They are currently requesting a spot with [facility] dialysis.    Pt was recently admitted from 6/11 to 6/21 during which she rec'd inpatient HD 5 times , the last was on 6/21, this past Saturday. The admission was primarily for sepsis w/ EColi UTI and LE cellulitis for which she rec'd IV abx.   ROS - denies CP, no joint pain, no HA, no blurry vision, no rash, no diarrhea, no nausea/ vomiting  PMH: CHF GI bleed Agitation/aggressive behavior Anoxic brain injury (2018) Asthma Chronic pain PTSD COPD Depression Delusional disorder History of substance abuse Dyslipidemia  alert Hypertension GERD Gout MI HL Manic depression OSA History of home oxygen  therapy Schizoaffective disorder, bipolar type History of seizures History of stroke   Past Surgical History  Past Surgical History:  Procedure Laterality Date   A/V FISTULAGRAM N/A 02/01/2023   Procedure: A/V Fistulagram;  Surgeon: Norine Manuelita LABOR, MD;  Location: [facility] INVASIVE CV LAB;  Service: Cardiovascular;  Laterality: N/A;   AV FISTULA PLACEMENT Left 12/21/2022   Procedure: INSERTION OF LEFT ARM ARTERIOVENOUS (AV) GORE-TEX GRAFT;  Surgeon: Magda Debby SAILOR, MD;  Location: [facility] OR;  Service: Vascular;  Laterality: Left;   CESAREAN SECTION  1997   COLONOSCOPY WITH PROPOFOL  N/A 04/01/2021   Procedure: COLONOSCOPY WITH PROPOFOL ;  Surgeon: Rollin Dover, MD;  Location: [facility];  Service: Gastroenterology;  Laterality: N/A;  Rectal bleeding with drop in hemoglobin to 7.2 g/dL   HERNIA REPAIR     IR CHOLANGIOGRAM EXISTING TUBE  07/20/2016   IR PERC CHOLECYSTOSTOMY  05/10/2016   IR RADIOLOGIST EVAL & MGMT  06/08/2016   IR RADIOLOGIST EVAL & MGMT  06/29/2016   IR SINUS/FIST TUBE CHK-NON GI  07/12/2016   RIGHT/LEFT HEART CATH AND CORONARY ANGIOGRAPHY N/A 06/19/2017   Procedure: RIGHT/LEFT HEART CATH AND CORONARY ANGIOGRAPHY;  Surgeon: Cherrie Toribio SAUNDERS, MD;  Location: [facility] INVASIVE CV LAB;  Service: Cardiovascular;  Laterality: N/A;   TIBIA IM NAIL INSERTION Right 07/12/2016   Procedure: INTRAMEDULLARY (IM) NAIL RIGHT TIBIA;  Surgeon: Jerri Kay HERO, MD;  Location: [facility] OR;  Service: Orthopedics;  Laterality: Right;   UMBILICAL HERNIA REPAIR  ~ 1963   that's why I don't have a belly button   VAGINAL HYSTERECTOMY     Family History  Family History  Problem Relation Age of Onset   Cancer Mother        lung   Depression Mother    Cancer Father        prostate   Depression Sister    Anxiety disorder Sister    Schizophrenia Sister    Bipolar disorder Sister    Depression Sister    Depression  Brother    Heart failure Other        cousin   Social History  reports that she quit smoking about 7 years ago. Her smoking use included cigarettes. She started smoking about 46 years ago. She has a 58.6 pack-year smoking history. She has been exposed to tobacco smoke. She has never used smokeless tobacco. She reports that she does not currently use drugs after having used the following drugs: Cocaine. She reports that she does not drink alcohol. Allergies  Allergies  Allergen Reactions   Asa [Aspirin ] Other (See Comments)    GI bleed   Deltasone  [Prednisone ] Anaphylaxis, Swelling and Other (See Comments)    Swelling of tongue, lips, throat   Latuda [Lurasidone Hcl] Anaphylaxis   Lortab [Hydrocodone -Acetaminophen ] Hives and Shortness Of Breath   Magnesium -Containing Compounds Anaphylaxis and Other (See Comments)    Tolerated Ensure   Ultram  [Tramadol ] Anaphylaxis, Swelling and Rash   Codeine Nausea And Vomiting and Rash   Desyrel  [Trazodone ] Other (See Comments)    Paranoia    Apresoline  [Hydralazine ] Other (See Comments)    Muscle spasms   Peanut (Diagnostic) Hives and Other (See Comments)    Raw peanuts only  OK to eat peanut butter.   Topamax  [Topiramate ] Other (See Comments)    Paranoia   Sulfa Antibiotics Itching   Tape Rash   Home medications Prior to Admission medications   Medication Sig Start Date End Date Taking? Authorizing Provider  albuterol  (VENTOLIN  HFA) 108 (90 Base) MCG/ACT inhaler Inhale 2 puffs into the lungs every 6 (six) hours as needed for wheezing or shortness of breath. 07/07/23   [place], Camellia PARAS, DO  amLODipine  (NORVASC ) 10 MG tablet Take 0.5 tablets (5 mg total) by mouth 2 (two) times daily. 07/07/23 10/05/23  [place], Camellia PARAS, DO  Asenapine  Maleate (SAPHRIS ) 10 MG SUBL Place 10 mg under the tongue at bedtime.    [provider]  atorvastatin  (LIPITOR ) 20 MG tablet Take 1 tablet (20 mg total) by mouth daily. 07/07/23 10/05/23  [place], Camellia PARAS, DO   calcitRIOL  (ROCALTROL ) 0.5 MCG capsule Take 1 capsule (0.5 mcg total) by mouth daily. 07/07/23   [place], Camellia PARAS, DO  carvedilol  (COREG ) 12.5 MG tablet Take 1 tablet (12.5 mg total) by mouth 2 (two) times daily. 07/07/23 10/05/23  [place], Eric J, DO  cyclobenzaprine  (FLEXERIL ) 10 MG tablet Take 1 tablet (10 mg total) by mouth 3 (three) times daily. 10/09/22   McDiarmid, Krystal BIRCH, MD  dicyclomine  (BENTYL ) 10 MG capsule Take 1 capsule (10 mg total) by mouth 3 (three) times daily. 07/07/23   [place], Camellia PARAS, DO  DULoxetine  (CYMBALTA ) 30 MG capsule Take 1 capsule (30 mg total) by mouth daily. 07/07/23   [place], Camellia PARAS, DO  epoetin  alfa (EPOGEN ) 10000 UNIT/ML injection Inject 1 mL (10,000 Units total) into the vein  every Monday, Wednesday, and Friday with hemodialysis. 11/22/22   Cam Charlie Loving, DO  escitalopram  (LEXAPRO ) 10 MG tablet Take 1 tablet (10 mg total) by mouth daily. 07/07/23 10/05/23  [place], Camellia PARAS, DO  ezetimibe  (ZETIA ) 10 MG tablet Take 1 tablet (10 mg total) by mouth daily. Please call office to schedule an appt for further refills. Thank you 07/07/23   [place], Camellia PARAS, DO  ferrous sulfate  325 (65 FE) MG tablet Take 1 tablet (325 mg total) by mouth daily. 07/07/23 07/01/24  [place], Eric J, DO  Fluticasone -Umeclidin-Vilant (TRELEGY ELLIPTA ) 100-62.5-25 MCG/ACT AEPB Inhale 1 puff into the lungs daily. 07/07/23   [place], Camellia PARAS, DO  folic acid  (FOLVITE ) 1 MG tablet Take 1 tablet (1 mg total) by mouth daily. 07/07/23   [place], Camellia PARAS, DO  gabapentin  (NEURONTIN ) 100 MG capsule Take 1 capsule (100 mg total) by mouth at bedtime. 07/07/23 10/05/23  [place], Camellia PARAS, DO  hydrOXYzine  (ATARAX ) 10 MG tablet Take 1 tablet (10 mg total) by mouth 3 (three) times daily as needed for anxiety. Patient taking differently: Take 10 mg by mouth 3 (three) times daily. 03/14/23   Mannie Jerel PARAS, NP  ibuprofen  (ADVIL ) 200 MG tablet Take 200 mg by mouth every 6 (six) hours as needed for headache or moderate pain (pain  score 4-6).    [provider]  lamoTRIgine  (LAMICTAL ) 100 MG tablet Take 1 tablet (100 mg total) by mouth at bedtime. 07/07/23 10/05/23  [place], Camellia PARAS, DO  lidocaine  (LIDODERM ) 5 % Place 1 patch onto the skin daily. Remove & Discard patch within 12 hours or as directed by MD Patient taking differently: Place 1 patch onto the skin daily as needed (pain). 02/20/23   Singh, Prashant K, MD  loperamide  (IMODIUM ) 2 MG capsule Take 1 capsule (2 mg total) by mouth 2 (two) times daily. Patient taking differently: Take 2 mg by mouth 3 (three) times daily. 10/09/22   McDiarmid, Krystal BIRCH, MD  MELATONIN PO Take 1 tablet by mouth at bedtime.    [provider]  metolazone  (ZAROXOLYN ) 2.5 MG tablet Take 1 tablet (2.5 mg total) by mouth every Monday, Wednesday, and Friday. 07/09/23 10/07/23  [place], Camellia PARAS, DO  mirtazapine  (REMERON ) 7.5 MG tablet Take 2 tablets (15 mg total) by mouth at bedtime. 07/07/23 10/05/23  [place], Eric J, DO  Multiple Vitamins-Minerals (CENTRUM SILVER  50+WOMEN) TABS Take 1 tablet by mouth at bedtime.    [provider]  omeprazole  (PRILOSEC) 40 MG capsule Take 1 capsule (40 mg total) by mouth every morning. 07/07/23 10/05/23  [place], Camellia PARAS, DO  prazosin  (MINIPRESS ) 2 MG capsule Take 1 capsule (2 mg total) by mouth at bedtime. 02/26/23     QUEtiapine  (SEROQUEL ) 50 MG tablet Take 1 tablet (50 mg total) by mouth 2 (two) times daily. 07/07/23 10/05/23  [place], Camellia PARAS, DO  torsemide  (DEMADEX ) 20 MG tablet Take 1 tablet (20 mg total) by mouth 2 (two) times daily. Patient taking differently: No sig reported 02/13/23        Vitals:   07/10/23 0705 07/10/23 0707 07/10/23 1000 07/10/23 1026  BP: 120/72  130/89 130/89  Pulse: 77  79 79  Resp: 16   18  Temp: 98 F (36.7 C)     SpO2: 100%  100% 100%  Weight:  86.6 kg    Height:  5' 2 (1.575 m)     Exam Gen alert, no distress, on RA, stable No rash, cyanosis or gangrene Sclera anicteric,  throat clear  No jvd or  bruits Chest clear bilat to bases, no rales/ wheezing RRR no MRG Abd soft ntnd no mass or ascites +bs GU defer MS no joint effusions or deformity Ext 2+ bilat LE edema, no other edema Neuro is alert, Ox 3 , nf    RIJ TDC intact      Renal-related home meds: Norvasc  5 every day Coreg  12.5 bid Torsemide  20mg  bid Zaroxolyn  2.5 mg mwf  Prazosin  2mg  qhs Others: seroquel , PPI, MVI, remeron , lamictal , gabapentin , trelegy ellipta , zetia , lexapro , cymbalta , statin    OP HD: [facility]--> 4h  B500  83kg  [facility] TDC  Heparin  none Last OP HD 05/30/23, post wt 85.2kg (dry wt 83.5kg) Very good compliance up until 5/14, since then has only had inpatient HD    Assessment/ Plan: Volume overload: not sure due to excessive fluid intake vs lean body wt loss.  She was recently here for last 2 wks and had inpatient HD during that stay. Will need sig vol removal w/ HD.  Hyponatremia: due to vol overload in esrd pt. Plan is vol removal w/ HD.  ESRD: on HD TTS. Refuses to go to her OP unit, our SW is working on finding another group that will accept her. Plan is for HD today and tomorrow.  HTN: bp's 120-130, resume OP BP meds as needed.  Volume: as above, max UF w/ HD Anemia of esrd: Hb 7- 9 here, will check when last rec'd an ESA here.  Secondary hyperparathyroidism: CCa and phos in range. Not sure if taking binder.       Myer Fret  MD [facility] 07/10/2023, 10:34 AM  Recent Labs  Lab 07/07/23 0820 07/10/23 0717  HGB 7.7* 8.0*  ALBUMIN  2.9*  --   CALCIUM  9.2 8.9  PHOS 4.3  --   CREATININE 4.52* 5.01*  K 4.3 3.1*   Inpatient medications:

## 2023-07-10 NOTE — ED Notes (Signed)
 Patient to dialysis.

## 2023-07-10 NOTE — Progress Notes (Signed)
 Patient not received in Hemodialysis. Patient has been transported to 5C17. Attempting to reach floor for report.

## 2023-07-10 NOTE — ED Provider Notes (Signed)
 [place] EMERGENCY DEPARTMENT AT [facility] Provider Note   CSN: [id number] Arrival date & time: 07/10/23  9340     Patient presents with: DIALYSIS   Tiffany [name] is a 63 y.o. female.   With history of end-stage renal disease, on dialysis since earlier this year, makes urine, recent admission for sepsis due to UTI (discharged 07/07/2023) --presents to the emergency department questing dialysis.  Her only complaint currently is that her legs are more swollen.  No respiratory difficulties.  Per previous discharge summary, patient supposed to dialyze today at [facility] dialysis center.  Patient states that she does not go there and then later states that she is choosing not to go there.  She states that they are working to try to find her a new home base.       Prior to Admission medications   Medication Sig Start Date End Date Taking? Authorizing Provider  albuterol  (VENTOLIN  HFA) 108 (90 Base) MCG/ACT inhaler Inhale 2 puffs into the lungs every 6 (six) hours as needed for wheezing or shortness of breath. 07/07/23   [place], Camellia PARAS, DO  amLODipine  (NORVASC ) 10 MG tablet Take 0.5 tablets (5 mg total) by mouth 2 (two) times daily. 07/07/23 10/05/23  [place], Camellia PARAS, DO  Asenapine  Maleate (SAPHRIS ) 10 MG SUBL Place 10 mg under the tongue at bedtime.    [provider]  atorvastatin  (LIPITOR ) 20 MG tablet Take 1 tablet (20 mg total) by mouth daily. 07/07/23 10/05/23  [place], Eric J, DO  calcitRIOL  (ROCALTROL ) 0.5 MCG capsule Take 1 capsule (0.5 mcg total) by mouth daily. 07/07/23   [place], Camellia PARAS, DO  carvedilol  (COREG ) 12.5 MG tablet Take 1 tablet (12.5 mg total) by mouth 2 (two) times daily. 07/07/23 10/05/23  [place], Camellia PARAS, DO  cyclobenzaprine  (FLEXERIL ) 10 MG tablet Take 1 tablet (10 mg total) by mouth 3 (three) times daily. 10/09/22   McDiarmid, Krystal BIRCH, MD  dicyclomine  (BENTYL ) 10 MG capsule Take 1 capsule (10 mg total) by mouth 3 (three) times daily. 07/07/23    [place], Camellia PARAS, DO  DULoxetine  (CYMBALTA ) 30 MG capsule Take 1 capsule (30 mg total) by mouth daily. 07/07/23   [place], Camellia PARAS, DO  epoetin  alfa (EPOGEN ) 10000 UNIT/ML injection Inject 1 mL (10,000 Units total) into the vein every Monday, Wednesday, and Friday with hemodialysis. 11/22/22   Cam Charlie Loving, DO  escitalopram  (LEXAPRO ) 10 MG tablet Take 1 tablet (10 mg total) by mouth daily. 07/07/23 10/05/23  [place], Camellia PARAS, DO  ezetimibe  (ZETIA ) 10 MG tablet Take 1 tablet (10 mg total) by mouth daily. Please call office to schedule an appt for further refills. Thank you 07/07/23   [place], Camellia PARAS, DO  ferrous sulfate  325 (65 FE) MG tablet Take 1 tablet (325 mg total) by mouth daily. 07/07/23 07/01/24  [place], Eric J, DO  Fluticasone -Umeclidin-Vilant (TRELEGY ELLIPTA ) 100-62.5-25 MCG/ACT AEPB Inhale 1 puff into the lungs daily. 07/07/23   [place], Camellia PARAS, DO  folic acid  (FOLVITE ) 1 MG tablet Take 1 tablet (1 mg total) by mouth daily. 07/07/23   [place], Camellia PARAS, DO  gabapentin  (NEURONTIN ) 100 MG capsule Take 1 capsule (100 mg total) by mouth at bedtime. 07/07/23 10/05/23  [place], Camellia PARAS, DO  hydrOXYzine  (ATARAX ) 10 MG tablet Take 1 tablet (10 mg total) by mouth 3 (three) times daily as needed for anxiety. Patient taking differently: Take 10 mg by mouth 3 (three) times daily. 03/14/23   Mannie Jerel PARAS, NP  ibuprofen  (ADVIL ) 200 MG tablet Take 200 mg by mouth every 6 (six) hours as needed for headache or moderate pain (pain score 4-6).    [provider]  lamoTRIgine  (LAMICTAL ) 100 MG tablet Take 1 tablet (100 mg total) by mouth at bedtime. 07/07/23 10/05/23  [place], Camellia PARAS, DO  lidocaine  (LIDODERM ) 5 % Place 1 patch onto the skin daily. Remove & Discard patch within 12 hours or as directed by MD Patient taking differently: Place 1 patch onto the skin daily as needed (pain). 02/20/23   Singh, Prashant K, MD  loperamide  (IMODIUM ) 2 MG capsule Take 1 capsule (2 mg total) by mouth 2 (two) times  daily. Patient taking differently: Take 2 mg by mouth 3 (three) times daily. 10/09/22   McDiarmid, Krystal BIRCH, MD  MELATONIN PO Take 1 tablet by mouth at bedtime.    [provider]  metolazone  (ZAROXOLYN ) 2.5 MG tablet Take 1 tablet (2.5 mg total) by mouth every Monday, Wednesday, and Friday. 07/09/23 10/07/23  [place], Camellia PARAS, DO  mirtazapine  (REMERON ) 7.5 MG tablet Take 2 tablets (15 mg total) by mouth at bedtime. 07/07/23 10/05/23  [place], Camellia PARAS, DO  Multiple Vitamins-Minerals (CENTRUM SILVER  50+WOMEN) TABS Take 1 tablet by mouth at bedtime.    [provider]  omeprazole  (PRILOSEC) 40 MG capsule Take 1 capsule (40 mg total) by mouth every morning. 07/07/23 10/05/23  [place], Camellia PARAS, DO  prazosin  (MINIPRESS ) 2 MG capsule Take 1 capsule (2 mg total) by mouth at bedtime. 02/26/23     QUEtiapine  (SEROQUEL ) 50 MG tablet Take 1 tablet (50 mg total) by mouth 2 (two) times daily. 07/07/23 10/05/23  [place], Camellia PARAS, DO  torsemide  (DEMADEX ) 20 MG tablet Take 1 tablet (20 mg total) by mouth 2 (two) times daily. Patient taking differently: No sig reported 02/13/23       Allergies: Asa [aspirin ], Deltasone  [prednisone ], Latuda [lurasidone hcl], Lortab [hydrocodone -acetaminophen ], Magnesium -containing compounds, Ultram  [tramadol ], Codeine, Desyrel  [trazodone ], Apresoline  [hydralazine ], Peanut (diagnostic), Topamax  [topiramate ], Sulfa antibiotics, and Tape    Review of Systems  Updated Vital Signs BP 120/72 (BP Location: Right Arm)   Pulse 77   Temp 98 F (36.7 C)   Resp 16   Ht 5' 2 (1.575 m)   Wt 86.6 kg   SpO2 100%   BMI 34.93 kg/m   Physical Exam Vitals and nursing note reviewed.  Constitutional:      General: [name] not in acute distress.    Appearance: [name] well-developed.  HENT:     Head: Normocephalic and atraumatic.     Right Ear: External ear normal.     Left Ear: External ear normal.     Nose: Nose normal.   Eyes:     Conjunctiva/sclera: Conjunctivae normal.     Cardiovascular:     Rate and Rhythm: Normal rate and regular rhythm.     Heart sounds: No murmur heard. Pulmonary:     Effort: No respiratory distress.     Breath sounds: No wheezing, rhonchi or rales.     Comments: No respiratory distress, no tachypnea, breath sounds clear bilaterally Abdominal:     Palpations: Abdomen is soft.     Tenderness: There is no abdominal tenderness. There is no guarding or rebound.   Musculoskeletal:     Cervical back: Normal range of motion and neck supple.     Right lower leg: Edema present.     Left lower leg: Edema present.     Comments: Bilateral symmetric lower  extremity edema and tenderness   Skin:    General: Skin is warm and dry.     Findings: No rash.   Neurological:     General: No focal deficit present.     Mental Status: [name] alert. Mental status is at baseline.     Motor: No weakness.   Psychiatric:        Mood and Affect: Mood normal.    ED Course  Patient seen and examined. History obtained directly from patient. Work-up including labs, imaging, EKG ordered in triage, if performed, were reviewed.    Labs/EKG: Independently reviewed and interpreted.  This included: CBC with normal white blood cell count, hemoglobin 8.0 which appears at baseline over the past 10 days; BMP with sodium 122, potassium 3.1, chloride 85, creatinine 5.01 with a BUN of 49, anion gap 16, bicarb 21.  Imaging: None ordered.   Medications/Fluids: None ordered.   Most recent vital signs reviewed and are as follows: BP 120/72 (BP Location: Right Arm)   Pulse 77   Temp 98 F (36.7 C)   Resp 16   Ht 5' 2 (1.575 m)   Wt 86.6 kg   SpO2 100%   BMI 34.93 kg/m   Initial impression: Pt requesting routine dialysis.  Will discuss with nephrology regarding appropriate disposition.  10:11 AM discussed earlier with Dr. Geralynn --he was going to look at the patient information and labs.  Awaiting callback.  12:18 PM Reassessment performed. Patient  appears stable.  Seen by nephrology.  Recommends admission for fluid overload and hyponatremia.  Plan for dialysis tonight and then again tomorrow.  Most current vital signs reviewed and are as follows: BP 130/89   Pulse 79   Temp 98 F (36.7 C)   Resp 18   Ht 5' 2 (1.575 m)   Wt 86.6 kg   SpO2 100%   BMI 34.93 kg/m   Plan: Admit to hospital.   12:58 PM Discussed case with Dr. Georgina with [facility] who will see.     (all labs ordered are listed, but only abnormal results are displayed) Labs Reviewed  CBC WITH DIFFERENTIAL/PLATELET - Abnormal; Notable for the following components:      Result Value   RBC 2.71 (*)    Hemoglobin 8.0 (*)    HCT 25.4 (*)    Abs Immature Granulocytes 0.10 (*)    All other components within normal limits  BASIC METABOLIC PANEL WITH GFR - Abnormal; Notable for the following components:   Sodium 122 (*)    Potassium 3.1 (*)    Chloride 85 (*)    CO2 21 (*)    BUN 49 (*)    Creatinine, Ser 5.01 (*)    GFR, Estimated 9 (*)    Anion gap 16 (*)    All other components within normal limits    EKG: None  Radiology: No results found.   Procedures   Medications Ordered in the ED - No data to display                                  Medical Decision Making Amount and/or Complexity of Data Reviewed Labs: ordered.  Risk Decision regarding hospitalization.   Patient with leg swelling, ESRD with poor compliance.  Social determinants of health affected by multiple comorbidities, compliance issues with ESRD.     Final diagnoses:  Hyponatremia  Other hypervolemia  ESRD (end stage renal disease) (  [facility])    ED Discharge Orders     None          Desiderio Chew, PA-C 07/10/23 1259    Ellouise Noyack K, [place] 07/10/23 1601

## 2023-07-10 NOTE — TOC CM/SW Note (Signed)
 Transition of Care [facility]) - Inpatient Brief Assessment   Patient Details  Name: Tiffany [name] MRN: [id number] Date of Birth: 1961/01/06  [facility] CM/SW Contact:    Lauraine FORBES Saa, LCSW Phone Number: 07/10/2023, 3:52 PM   Clinical Narrative:  3:52 PM Per chart review, patient resides at home alone. Patient has a PCP and insurance. Patient has SNF history with [facility]. Patient has HH history with [facility], [facility], and [facility]. Patient has HH aide history. Patient has DME (manual wheelchair, trapeze, rolling walker, oxygen , electric wheelchair, shower stool, BiPap, CPAP, nebulizer) with [facility], [facility], and [facility]. Patient's preferred pharmacy is [facility] [phone] [facility]. [name] provided patient with SDOH (food) resources. No other TOC needs were identified at this time. TOC will continue to follow and be available to assist.  Transition of Care Asessment: Insurance and Status: Insurance coverage has been reviewed Patient has primary care physician: Yes Home environment has been reviewed: Private Residence Prior level of function:: N/A Prior/Current Home Services: No current home services Social Drivers of Health Review: SDOH reviewed interventions complete Readmission risk has been reviewed: Yes Transition of care needs: no transition of care needs at this time

## 2023-07-11 DIAGNOSIS — N186 End stage renal disease: Secondary | ICD-10-CM | POA: Diagnosis not present

## 2023-07-11 DIAGNOSIS — Z992 Dependence on renal dialysis: Secondary | ICD-10-CM | POA: Diagnosis not present

## 2023-07-11 DIAGNOSIS — E8770 Fluid overload, unspecified: Secondary | ICD-10-CM | POA: Diagnosis not present

## 2023-07-11 LAB — COMPREHENSIVE METABOLIC PANEL WITH GFR
ALT: 6 U/L (ref 0–44)
AST: 16 U/L (ref 15–41)
Albumin: 3.2 g/dL — ABNORMAL LOW (ref 3.5–5.0)
Alkaline Phosphatase: 85 U/L (ref 38–126)
Anion gap: 10 (ref 5–15)
BUN: 29 mg/dL — ABNORMAL HIGH (ref 8–23)
CO2: 26 mmol/L (ref 22–32)
Calcium: 8.6 mg/dL — ABNORMAL LOW (ref 8.9–10.3)
Chloride: 92 mmol/L — ABNORMAL LOW (ref 98–111)
Creatinine, Ser: 3.29 mg/dL — ABNORMAL HIGH (ref 0.44–1.00)
GFR, Estimated: 15 mL/min — ABNORMAL LOW (ref >60)
Glucose, Bld: 98 mg/dL (ref 70–99)
Potassium: 3.5 mmol/L (ref 3.5–5.1)
Sodium: 128 mmol/L — ABNORMAL LOW (ref 135–145)
Total Bilirubin: 0.3 mg/dL (ref 0.0–1.2)
Total Protein: 6.5 g/dL (ref 6.5–8.1)

## 2023-07-11 LAB — CBC WITH DIFFERENTIAL/PLATELET
Abs Immature Granulocytes: 0.04 10*3/uL (ref 0.00–0.07)
Basophils Absolute: 0 10*3/uL (ref 0.0–0.1)
Basophils Relative: 1 %
Eosinophils Absolute: 0.2 10*3/uL (ref 0.0–0.5)
Eosinophils Relative: 3 %
HCT: 26.4 % — ABNORMAL LOW (ref 36.0–46.0)
Hemoglobin: 8.4 g/dL — ABNORMAL LOW (ref 12.0–15.0)
Immature Granulocytes: 1 %
Lymphocytes Relative: 13 %
Lymphs Abs: 0.8 10*3/uL (ref 0.7–4.0)
MCH: 29.4 pg (ref 26.0–34.0)
MCHC: 31.8 g/dL (ref 30.0–36.0)
MCV: 92.3 fL (ref 80.0–100.0)
Monocytes Absolute: 0.4 10*3/uL (ref 0.1–1.0)
Monocytes Relative: 6 %
Neutro Abs: 4.8 10*3/uL (ref 1.7–7.7)
Neutrophils Relative %: 76 %
Platelets: 286 10*3/uL (ref 150–400)
RBC: 2.86 MIL/uL — ABNORMAL LOW (ref 3.87–5.11)
RDW: 14.2 % (ref 11.5–15.5)
WBC: 6.2 10*3/uL (ref 4.0–10.5)
nRBC: 0 % (ref 0.0–0.2)

## 2023-07-11 LAB — MAGNESIUM: Magnesium: 1.8 mg/dL (ref 1.7–2.4)

## 2023-07-11 LAB — PHOSPHORUS: Phosphorus: 3.9 mg/dL (ref 2.5–4.6)

## 2023-07-11 MED ORDER — HEPARIN SODIUM (PORCINE) 1000 UNIT/ML DIALYSIS: 1000.0000 [IU] | INTRAMUSCULAR | Status: DC | PRN

## 2023-07-11 MED ORDER — ALTEPLASE 2 MG IJ SOLR
2.0000 mg | Freq: Once | INTRAMUSCULAR | Status: AC | PRN
  Administered 2023-07-11: 2 mg

## 2023-07-11 MED ORDER — DIPHENHYDRAMINE HCL 25 MG PO CAPS
25.0000 mg | ORAL_CAPSULE | Freq: Four times a day (QID) | ORAL | Status: DC | PRN
  Administered 2023-07-11: 50 mg via ORAL
  Administered 2023-07-12: 25 mg via ORAL
  Filled 2023-07-11: qty 1
  Filled 2023-07-11: qty 2

## 2023-07-11 MED ORDER — ALTEPLASE 2 MG IJ SOLR
INTRAMUSCULAR | Status: AC
  Filled 2023-07-11: qty 4

## 2023-07-11 MED ORDER — FENTANYL CITRATE PF 50 MCG/ML IJ SOSY
12.5000 ug | PREFILLED_SYRINGE | INTRAMUSCULAR | Status: DC | PRN
  Administered 2023-07-12: 12.5 ug via INTRAVENOUS
  Filled 2023-07-11: qty 1

## 2023-07-11 MED ORDER — HEPARIN SODIUM (PORCINE) 1000 UNIT/ML IJ SOLN
2000.0000 [IU] | Freq: Once | INTRAMUSCULAR | Status: AC
  Administered 2023-07-11: 2000 [IU] via INTRAVENOUS

## 2023-07-11 NOTE — TOC Initial Note (Signed)
 Transition of Care [facility]) - Initial/Assessment Note    Patient Details  Name: Tiffany [name] MRN: [id number] Date of Birth: [birth year]  Transition of Care [facility]) CM/SW Contact:    Tiffany FORBES Saa, LCSW Phone Number: 07/11/2023, 10:01 AM  Clinical Narrative:                  10:01 AM [name] introduced self and role to patient. Patient stated that she did not remember speaking with [name] yesterday despite being provided SDOH (food) resources. Patient confirmed that she resides at home alone and would need assistance transporting back home upon discharge. Patient confirmed she has transportation resources to get to medical/non-medical appointments. [name] provided additional transportation resources. Patient confirmed HH/DME history but denied SNF history and stated that she would not be interested in admitting to a SNF.   Expected Discharge Plan: Home/Self Care Barriers to Discharge: Continued Medical Work up, Other (must enter comment) (Dialysis Chair)   Patient Goals and CMS Choice Patient states their goals for this hospitalization and ongoing recovery are:: to switch outpatient dialysis clinics          Expected Discharge Plan and Services       Living arrangements for the past 2 months: Apartment                                      Prior Living Arrangements/Services Living arrangements for the past 2 months: Apartment Lives with:: Self Patient language and need for interpreter reviewed:: Yes              Criminal Activity/Legal Involvement Pertinent to Current Situation/Hospitalization: No - Comment as needed  Activities of Daily Living   ADL Screening (condition at time of admission) Independently performs ADLs?: No Does the patient have a NEW difficulty with bathing/dressing/toileting/self-feeding that is expected to last >3 days?: No Does the patient have a NEW difficulty with getting in/out of bed, walking, or climbing stairs that is expected to last >3  days?: No Does the patient have a NEW difficulty with communication that is expected to last >3 days?: No Is the patient deaf or have difficulty hearing?: No Does the patient have difficulty seeing, even when wearing glasses/contacts?: Yes Does the patient have difficulty concentrating, remembering, or making decisions?: No  Permission Sought/Granted Permission sought to share information with : Family Supports Permission granted to share information with : No (Contact information on chart)  Share Information with NAME: Tiffany [name]     Permission granted to share info w Relationship: Daughter  Permission granted to share info w Contact Information: [phone]  Emotional Assessment Appearance:: Appears stated age Attitude/Demeanor/Rapport: Engaged Affect (typically observed): Appropriate, Stable Orientation: : Oriented to Self, Oriented to Place, Oriented to  Time, Oriented to Situation Alcohol / Substance Use: Not Applicable Psych Involvement: No (comment)  Admission diagnosis:  Hyponatremia [E87.1] ESRD (end stage renal disease) (HCC) [N18.6] ESRD (end stage renal disease) on dialysis (HCC) [N18.6, Z99.2] Other hypervolemia [E87.79] Patient Active Problem List   Diagnosis Date Noted   ESRD (end stage renal disease) on dialysis (HCC) 07/10/2023   Sepsis (HCC) 06/27/2023   Homicidal ideation 06/01/2023   MDD (major depressive disorder), recurrent, severe, with psychosis (HCC) 03/10/2023   Influenza A 02/14/2023   Community acquired pneumonia 02/14/2023   ESRD on dialysis (HCC) 01/30/2023   Schizoaffective disorder (HCC) 11/10/2022   CKD (chronic kidney disease) stage 5, GFR less  than 15 ml/min (HCC) 09/12/2022   AKI (acute kidney injury) (HCC) 06/21/2022   Spondylolisthesis at L3-L4 level 06/12/2022   Facet arthropathy, lumbar 06/12/2022   Pelvic floor weakness in female 06/12/2022   Lower extremity weakness 06/11/2022   Acute on chronic diastolic heart failure  (HCC) 94/74/7975   Hip pain 06/10/2022   Urinary incontinence 03/08/2022   Incontinence of feces 03/08/2022   Neck pain 02/28/2022   Dyspnea 02/03/2022   Subacute osteomyelitis of lumbar spine (HCC) 09/27/2021   Bipolar affective disorder, currently depressed, mild (HCC) 09/23/2021   History of falling 07/01/2021   Anemia of renal disease 06/16/2021   Anxiety and depression 05/25/2021   Physical deconditioning 05/25/2021   Cauda equina compression (HCC) 04/24/2021   Severe Spinal stenosis, lumbar 04/24/2021   Hyperlipidemia 03/31/2021   Coronary artery disease involving native coronary artery of native heart without angina pectoris 01/16/2021   Anxiety disorder, unspecified 01/16/2021   GERD (gastroesophageal reflux disease)    Polypharmacy 11/17/2020   Schizophrenia, paranoid (HCC) 02/20/2020   Hypersomnia, Chronic    Adnexal cyst, right 01/02/2020   Genital herpes 11/25/2019   Family discord 02/04/2019   PTSD (post-traumatic stress disorder) 05/27/2018   Schizoaffective disorder, bipolar type (HCC) 04/05/2018   Acute hypoxic respiratory failure (HCC)    Frequent falls 10/11/2017   Dependence on continuous supplemental oxygen  05/14/2017   Gout 04/11/2017   Chronic kidney disease (CKD), stage IV (severe) (HCC) 12/15/2016   Chest pain 11/03/2016   History of Anoxic brain injury (HCC) 09/08/2016   Overactive bladder 06/07/2016   OSA and COPD overlap syndrome (HCC)    Osteoarthritis, multiple sites    Acute renal failure superimposed on stage 4 chronic kidney disease (HCC) 11/24/2015   Tobacco use disorder 07/22/2014   Seizure (HCC) 01/04/2013   Chronic low back pain with sciatica 06/18/2012   Morbid obesity (HCC) 10/18/2010   PCP:  Ilah Crigler, MD Pharmacy:   [facility] [phone] [address], [place] - [address] AT [facility] OF [address] [place] [address] Phone: [phone] Fax: [phone]     Social Drivers of Health (SDOH) Social  History: SDOH Screenings   Food Insecurity: Food Insecurity Present (07/10/2023)  Housing: Low Risk  (07/10/2023)  Transportation Needs: No Transportation Needs (07/10/2023)  Utilities: Not At Risk (07/10/2023)  Alcohol Screen: Low Risk  (11/10/2022)  Depression (PHQ2-9): Low Risk  (07/11/2022)  Financial Resource Strain: Low Risk  (07/11/2022)  Physical Activity: Inactive (07/11/2022)  Social Connections: Socially Isolated (06/28/2023)  Stress: Stress Concern Present (07/11/2022)  Tobacco Use: Medium Risk (07/10/2023)   SDOH Interventions: Food Insecurity Interventions: Community Resources Provided, Inpatient TOC   Readmission Risk Interventions    06/29/2023    3:13 PM 06/05/2022    2:47 PM 02/07/2022    2:22 PM  Readmission Risk Prevention Plan  Transportation Screening Complete Complete Complete  Medication Review (RN Care Manager) Referral to Pharmacy Referral to Pharmacy Complete  PCP or Specialist appointment within 3-5 days of discharge Complete Complete Complete  HRI or Home Care Consult Complete Complete Complete  SW Recovery Care/Counseling Consult Complete Complete Complete  Palliative Care Screening Not Applicable Not Applicable Not Applicable  Skilled Nursing Facility Not Applicable Not Applicable Not Applicable

## 2023-07-11 NOTE — Plan of Care (Signed)
  Problem: Activity: Goal: Risk for activity intolerance will decrease Outcome: Progressing   Problem: Nutrition: Goal: Adequate nutrition will be maintained Outcome: Progressing   Problem: Elimination: Goal: Will not experience complications related to bowel motility Outcome: Progressing Goal: Will not experience complications related to urinary retention Outcome: Progressing   Problem: Skin Integrity: Goal: Risk for impaired skin integrity will decrease Outcome: Progressing   

## 2023-07-11 NOTE — Progress Notes (Signed)
 PROGRESS NOTE    Tiffany [name]  FMW:[id number] DOB: November 17, 1960 DOA: 07/10/2023 PCP: Ilah Crigler, MD   Brief Narrative:  Tiffany [name] is a 63 y.o. female with medical history significant of ESRD on HD TTS, chronic diastolic congestive heart failure, schizoaffective disorder, anemia of chronic medical/renal disease, OSA, GERD, neuropathy, anxiety/depression, active tobacco abuse, history of seizure disorder, and recent admission on 07/07/2023 for E. Coli and UTI and HD at that time p/w BLE swelling and hyponatremia c/w volume overload and need for HD. Nephrology consulted and dialyze the patient yesterday and will be dialyzing again today.  Assessment and Plan:  Volume overload 2/2 ESRD ESRD on HD / Metabolic Acidosis  -BUN/Cr Trend: Recent Labs  Lab 07/01/23 0816 07/03/23 0601 07/04/23 0523 07/05/23 0530 07/07/23 0820 07/10/23 0717 07/11/23 1017  BUN 36* 66* 53* 68* 56* 49* 29*  CREATININE 3.71* 5.04* 4.15* 4.87* 4.52* 5.01* 3.29*  -C/w PTA calcitriol  0.64mcg daily -Strict I&Os and daily weights (standing preferred) -Renally dose medications for CrCl  -Avoid Enoxaparin , Avoid Nephrotoxic Medications such as NSAIDs, Morphine , Fleet's Phosphate Enema, Regular Insulin  Contrast Dyes and Gadolinium for MRI to avoid Nephrogenic systemic fibrosis; Avoid Hypotension and Dehydration to Ensure Adequate Renal Perfusion and will need to Renally Adjust Meds. CTM and Trend Renal Function carefully and repeat CMP in the AM  -Nephrology following and plan is for dialysis again today. -PT recommends no follow-up  Essential HTN: C/w Amlodipine  5 mg po BID and Carvedilol  12.5 mg po BID. CTM BP per Protocol. Last BP reading was 132/87  Anxiety/Hx of Manic Depression/Schizoaffective Disorder/Mood Disorder: C/w Duloxetine  30 mg po Daily, Escitalopram  10 mg po Daily, Prazosin  2 mg po qHS, Lamotrigine  100 mg po at bedtime, Mirazepine 15 mg po at bedtime, and Quetiapine  50 mg po BID   COPD: Not in  Exacerbation. C/w Breztri  and Duonebs 3 mL q6hprn Wheezing and SOB  Hyponatremia: Fluctuating and likely to be corrected in Dialysis. Na+ went from 127 -> 122 -> 128. CTM and Trend and repeat CMP in the AM  DM Type 2 complicated by Neuropathy: Check HbA1c in the AM. If necessary will place on Very Sensitive Novolog  SSI AC. C/w Gabapentin  100 mg po qHS   HLD/Dyslipidemia: C/w Atrovastatin 20 mg po Daily and Ezetemibe 10 mg po Daily   Normocytic Anemia/Anemia of Chronic Kidney Disease: Hgb/Hct went from 7.7/25.0 -> 8.0/25.4. Check Anemia Panel in the AM. CTM for S/Sx of Bleeding; No overt bleeding noted. Repeat CBC in the AM  GERD/GI Prophylaxis: Continue PPI w/ Pantoprazole  40 mg po Daily   Hypoalbuminemia: Patient's Albumin  Trend: Recent Labs  Lab 06/13/23 2347 06/15/23 1514 06/27/23 1629 06/28/23 0718 06/30/23 0500 07/01/23 0816 07/07/23 0820  ALBUMIN  2.9* 3.4* 3.4* 2.7* 2.4* 2.6* 2.9*  -CTM and Trend and repeat CMP in the AM  Class II Obesity: Complicates overall prognosis and care. Estimated body mass index is 38.06 kg/m as calculated from the following:   Height as of this encounter: 5' 2 (1.575 m).   Weight as of this encounter: 94.4 kg. Weight Loss and Dietary Counseling given   DVT prophylaxis: heparin  injection 5,000 Units Start: 07/10/23 2200    Code Status: Full Code Family Communication: No family present @ bedside  Disposition Plan:  Level of care: Med-Surg Status is: Inpatient Remains inpatient appropriate because: Needs further clinical improvement and clearance by the specialist   Consultants:  Nephrology  Procedures:  As delineated as above  Antimicrobials:  Anti-infectives (From admission, onward)  None       Subjective: Seen and examined at bedside she is sitting in her chair and complained that her legs were hurting.  No nausea or vomiting.  Feels okay.  Denies shortness of breath currently.  No other concerns or complaints at this  time.  Objective: Vitals:   07/11/23 1820 07/11/23 1830 07/11/23 1845 07/11/23 1944  BP:  136/86 (!) 147/84 132/87  Pulse: (P) 78 79 81 80  Resp: (P) 20 20 17 16   Temp:  97.6 F (36.4 C) 97.6 F (36.4 C) 99 F (37.2 C)  TempSrc:    Oral  SpO2: (P) 100% 100% 100% 97%  Weight:  92.9 kg 92.9 kg   Height:        Intake/Output Summary (Last 24 hours) at 07/11/2023 2000 Last data filed at 07/11/2023 1915 Gross per 24 hour  Intake --  Output 3502.6 ml  Net -3502.6 ml   Filed Weights   07/11/23 1442 07/11/23 1830 07/11/23 1845  Weight: 93.8 kg 92.9 kg 92.9 kg   Examination: Physical Exam:  Constitutional: WN/WD, obese AAF in NAD Respiratory: Diminished to auscultation bilaterally, no wheezing, rales, rhonchi or crackles. Normal respiratory effort and patient is not tachypenic. No accessory muscle use. Unlabored Breathing  Cardiovascular: RRR, no murmurs / rubs / gallops. S1 and S2 auscultated. Mild LE Edema Abdomen: Soft, non-tender, Distended 2/2 Body habitus Bowel sounds positive.  GU: Deferred. Musculoskeletal: No clubbing / cyanosis of digits/nails. No joint deformity upper and lower extremities.  Skin: No rashes, lesions, ulcers on a limited skin evaluation. No induration; Warm and dry.  Neurologic: CN 2-12 grossly intact with no focal deficits but has some tardive dyskinesia. Romberg sign and cerebellar reflexes not assessed.  Psychiatric: Awake and alert.   Data Reviewed: I have personally reviewed following labs and imaging studies  CBC: Recent Labs  Lab 07/05/23 0530 07/07/23 0820 07/10/23 0717 07/11/23 1017  WBC 6.3 6.6 9.7 6.2  NEUTROABS  --   --  7.0 4.8  HGB 7.9* 7.7* 8.0* 8.4*  HCT 26.5* 25.0* 25.4* 26.4*  MCV 98.1 95.4 93.7 92.3  PLT 275 271 305 286   Basic Metabolic Panel: Recent Labs  Lab 07/05/23 0530 07/07/23 0820 07/10/23 0717 07/11/23 1017  NA 132* 127* 122* 128*  K 4.2 4.3 3.1* 3.5  CL 98 93* 85* 92*  CO2 26 25 21* 26  GLUCOSE 96 116*  93 98  BUN 68* 56* 49* 29*  CREATININE 4.87* 4.52* 5.01* 3.29*  CALCIUM  8.8* 9.2 8.9 8.6*  MG  --   --   --  1.8  PHOS  --  4.3  --  3.9   GFR: Estimated Creatinine Clearance: 18.3 mL/min (A) (by C-G formula based on SCr of 3.29 mg/dL (H)). Liver Function Tests: Recent Labs  Lab 07/07/23 0820 07/11/23 1017  AST  --  16  ALT  --  6  ALKPHOS  --  85  BILITOT  --  0.3  PROT  --  6.5  ALBUMIN  2.9* 3.2*   No results for input(s): LIPASE, AMYLASE in the last 168 hours. No results for input(s): AMMONIA  in the last 168 hours. Coagulation Profile: No results for input(s): INR, PROTIME in the last 168 hours. Cardiac Enzymes: No results for input(s): CKTOTAL, CKMB, CKMBINDEX, TROPONINI in the last 168 hours. BNP (last 3 results) No results for input(s): PROBNP in the last 8760 hours. HbA1C: No results for input(s): HGBA1C in the last 72 hours. CBG: No results for  input(s): GLUCAP in the last 168 hours. Lipid Profile: No results for input(s): CHOL, HDL, LDLCALC, TRIG, CHOLHDL, LDLDIRECT in the last 72 hours. Thyroid  Function Tests: No results for input(s): TSH, T4TOTAL, FREET4, T3FREE, THYROIDAB in the last 72 hours. Anemia Panel: No results for input(s): VITAMINB12, FOLATE, FERRITIN, TIBC, IRON , RETICCTPCT in the last 72 hours. Sepsis Labs: No results for input(s): PROCALCITON, LATICACIDVEN in the last 168 hours.  Recent Results (from the past 240 hours)  Wet prep, genital     Status: None   Collection Time: 07/06/23  3:00 PM   Specimen: Vaginal  Result Value Ref Range Status   Yeast Wet Prep HPF POC NONE SEEN NONE SEEN Final   Trich, Wet Prep NONE SEEN NONE SEEN Final   Clue Cells Wet Prep HPF POC NONE SEEN NONE SEEN Final   WBC, Wet Prep HPF POC <10 <10 Final   Sperm NONE SEEN  Final    Comment: Performed at [facility], [address] [place], [place] [address]    Radiology Studies: No results  found.  Scheduled Meds:  amLODipine   5 mg Oral BID   atorvastatin   20 mg Oral Daily   budesonide -glycopyrrolate -formoterol   2 puff Inhalation BID   calcitRIOL   0.5 mcg Oral Daily   carvedilol   12.5 mg Oral BID   Chlorhexidine  Gluconate Cloth  6 each Topical Q0600   Chlorhexidine  Gluconate Cloth  6 each Topical Q0600   DULoxetine   30 mg Oral Daily   escitalopram   10 mg Oral Daily   ezetimibe   10 mg Oral Daily   gabapentin   100 mg Oral QHS   heparin   5,000 Units Subcutaneous Q8H   lamoTRIgine   100 mg Oral QHS   mirtazapine   15 mg Oral QHS   pantoprazole   40 mg Oral Daily   prazosin   2 mg Oral QHS   QUEtiapine   50 mg Oral BID   Continuous Infusions:   LOS: 1 day   Alejandro Marker, DO [facility] Available via Epic secure chat 7am-7pm After these hours, please refer to coverage provider listed on [email or web address] 07/11/2023, 8:00 PM

## 2023-07-11 NOTE — Evaluation (Signed)
 Physical Therapy Evaluation Patient Details Name: Tiffany [name] MRN: [id number] DOB: 10-22-1960 Today's Date: 07/11/2023  History of Present Illness  [name] is a 63 y.o. female admitted due to need for HD as she wont return to regular HD center for unclear reasons. PMH: ESRD on HD TTS, chronic diastolic congestive heart failure, schizoaffective disorder, anemia of chronic medical/renal disease, OSA, GERD, neuropathy, anxiety/depression, active tobacco abuse, history of seizure disorder, and recent admission on 07/07/2023 for E. Coli and UTI and HD   Clinical Impression  Pt admitted with above. PTA pt lived alone, non-ambulatory but able to transfer self to/from bed/electric w/c and performs sponge bathing and changes depends at kitchen sink. Pt reports ordering food in and not cooking for herself and receiving transport to/from HD. Pt near functional baseline. Acute PT to cont to follow as pt is returning home alone and want to minimize deconditioning during hospital stay focusing on standing tolerance and balance to complete standing ADLs at her sink. I do not anticipate pt to require follow up PT upon d/c if she continues to maintain current level of function.      If plan is discharge home, recommend the following:     Can travel by private vehicle        Equipment Recommendations None recommended by PT  Recommendations for Other Services       Functional Status Assessment Patient has had a recent decline in their functional status and demonstrates the ability to make significant improvements in function in a reasonable and predictable amount of time.     Precautions / Restrictions Precautions Precautions: Fall Restrictions Weight Bearing Restrictions Per Provider Order: No      Mobility  Bed Mobility Overal bed mobility: Modified Independent             General bed mobility comments: HOB elevated, pt used bed rails and transferred herself to EOB     Transfers Overall transfer level: Needs assistance Equipment used:  (back of chair) Transfers: Sit to/from Stand Sit to Stand: Contact guard assist           General transfer comment: simulated pulling self up at sink, contact guard for safety, no physical assist    Ambulation/Gait               General Gait Details: pt reports being non-ambulatory but was able to side step with minA for stability to [facility]  Stairs            Wheelchair Mobility     Tilt Bed    Modified Rankin (Stroke Patients Only)       Balance Overall balance assessment: Needs assistance Sitting-balance support: Feet supported, No upper extremity supported Sitting balance-Leahy Scale: Good     Standing balance support: Bilateral upper extremity supported, During functional activity, Reliant on assistive device for balance Standing balance-Leahy Scale: Poor Standing balance comment: reliant on external support                             Pertinent Vitals/Pain Pain Assessment Pain Assessment: Faces Faces Pain Scale: Hurts little more Pain Location: bilat LEs Pain Descriptors / Indicators: Numbness, Tingling (neuropathy)    Home Living Family/patient expects to be discharged to:: Private residence Living Arrangements: Alone Available Help at Discharge: Family;Available PRN/intermittently Type of Home: House Home Access: Ramped entrance       Home Layout: One level Home Equipment: BSC/3in1;Hospital bed;Wheelchair - Surveyor, quantity (  2 wheels)      Prior Function Prior Level of Function : Needs assist             Mobility Comments: pt reports having transportation to/from HD, completes std pvt transfer from bed to electric w/c and states If I don't feel good, I dont transfer ADLs Comments: pt reports sponge bathing at kitchen sink because she can't get her w/c in the bathroom. reports wearing depends and standing at sink to change them, reports ordering  food in and doesn't cook for herself     Extremity/Trunk Assessment   Upper Extremity Assessment Upper Extremity Assessment: Generalized weakness    Lower Extremity Assessment Lower Extremity Assessment: Generalized weakness    Cervical / Trunk Assessment Cervical / Trunk Assessment: Normal  Communication   Communication Communication: No apparent difficulties    Cognition Arousal: Alert Behavior During Therapy: Flat affect   PT - Cognitive impairments: History of cognitive impairments                       PT - Cognition Comments: aware of psych history Following commands: Intact       Cueing Cueing Techniques: Verbal cues     General Comments General comments (skin integrity, edema, etc.): VSS    Exercises     Assessment/Plan    PT Assessment Patient needs continued PT services  PT Problem List Decreased strength;Decreased activity tolerance;Decreased balance;Decreased mobility       PT Treatment Interventions DME instruction;Functional mobility training;Therapeutic activities;Therapeutic exercise;Balance training    PT Goals (Current goals can be found in the Care Plan section)  Acute Rehab PT Goals Patient Stated Goal: home PT Goal Formulation: With patient Time For Goal Achievement: 07/25/23 Potential to Achieve Goals: Good    Frequency Min 2X/week     Co-evaluation               AM-PAC PT 6 Clicks Mobility  Outcome Measure Help needed turning from your back to your side while in a flat bed without using bedrails?: None Help needed moving from lying on your back to sitting on the side of a flat bed without using bedrails?: None Help needed moving to and from a bed to a chair (including a wheelchair)?: A Lot Help needed standing up from a chair using your arms (e.g., wheelchair or bedside chair)?: A Lot Help needed to walk in hospital room?: Total Help needed climbing 3-5 steps with a railing? : Total 6 Click Score: 14     End of Session Equipment Utilized During Treatment: Gait belt Activity Tolerance: Patient tolerated treatment well Patient left: in bed;with call bell/phone within reach;with bed alarm set (sitting EOB to eat lunch) Nurse Communication: Mobility status;Patient requests pain meds (and request for benadryl ) PT Visit Diagnosis: Unsteadiness on feet (R26.81);Muscle weakness (generalized) (M62.81)    Time: 1205-1219 PT Time Calculation (min) (ACUTE ONLY): 14 min   Charges:   PT Evaluation $PT Eval Low Complexity: 1 Low   PT General Charges $$ ACUTE PT VISIT: 1 Visit         Norene Ames, PT, DPT Acute Rehabilitation Services Secure chat preferred Office #: [phone]   Norene CHRISTELLA Ames 07/11/2023, 2:22 PM

## 2023-07-11 NOTE — Progress Notes (Signed)
 [facility] Progress Note  Subjective:  Seen in room Feeling better 3.1 L off w/ HD yest  Vitals:   07/10/23 2041 07/10/23 2054 07/10/23 2113 07/11/23 0351  BP: (!) 139/100 124/77 124/77 102/60  Pulse: 77 73 73 69  Resp: 16 16  16   Temp: 98.2 F (36.8 C) 98.6 F (37 C)  98.6 F (37 C)  TempSrc: Oral Oral  Oral  SpO2: 100% 100%  98%  Weight:      Height:        Exam: Gen alert, no distress, on RA, stable No jvd or bruits Chest clear bilat to bases, no rales/ wheezing RRR no MRG Abd soft ntnd no mass or ascites +bs Ext 1+ bilat LE edema, improving Neuro is alert, Ox 3 , nf    RIJ TDC intact        Renal-related home meds: Norvasc  5 every day Coreg  12.5 bid Torsemide  20mg  bid Zaroxolyn  2.5 mg mwf  Prazosin  2mg  qhs Others: seroquel , PPI, MVI, remeron , lamictal , gabapentin , trelegy ellipta , zetia , lexapro , cymbalta , statin      [facility]--> 4h  B500  83kg  RIJ TDC  Heparin  none Last OP HD 05/30/23, post wt 85.2kg (dry wt 83.5kg) Very good compliance up until 5/14, since then has only had inpatient HD       Assessment/ Plan: Volume overload: not sure due to excessive fluid intake vs lean body wt loss.  She was recently here for last 2 wks and had inpatient HD during that stay. Believe she is losing body wt. Had HD yest w/ 3.1 L off. HD again today max UF.  Hyponatremia: due to vol overload in esrd pt. Plan is vol removal w/ HD. Recheck Na+.  ESRD: on HD TTS. Refuses to go to her OP unit, our SW is working on finding another group that will accept her. Had HD here yest, plan on HD again today then reassess.  HTN: bp's 120-130, resume OP BP meds as needed.  Volume: as above, max UF w/ HD Anemia of esrd: Hb 7- 9 here, follow, transfuse prn .  Secondary hyperparathyroidism: CCa and phos in range. Not sure if taking binder.        Myer Fret MD  [facility] 07/11/2023, 9:38 AM  Recent Labs  Lab 07/07/23 0820 07/10/23 0717  HGB 7.7*  8.0*  ALBUMIN  2.9*  --   CALCIUM  9.2 8.9  PHOS 4.3  --   CREATININE 4.52* 5.01*  K 4.3 3.1*   No results for input(s): IRON , TIBC, FERRITIN in the last 168 hours. Inpatient medications:  amLODipine   5 mg Oral BID   atorvastatin   20 mg Oral Daily   budesonide -glycopyrrolate -formoterol   2 puff Inhalation BID   calcitRIOL   0.5 mcg Oral Daily   carvedilol   12.5 mg Oral BID   Chlorhexidine  Gluconate Cloth  6 each Topical Q0600   Chlorhexidine  Gluconate Cloth  6 each Topical Q0600   DULoxetine   30 mg Oral Daily   escitalopram   10 mg Oral Daily   ezetimibe   10 mg Oral Daily   gabapentin   100 mg Oral QHS   heparin   5,000 Units Subcutaneous Q8H   lamoTRIgine   100 mg Oral QHS   mirtazapine   15 mg Oral QHS   pantoprazole   40 mg Oral Daily   prazosin   2 mg Oral QHS   QUEtiapine   50 mg Oral BID    diphenhydrAMINE , ipratropium-albuterol 

## 2023-07-11 NOTE — Progress Notes (Signed)
   07/11/23 1830  Vitals  Temp 97.6 F (36.4 C)  Pulse Rate 79  Resp 20  BP 136/86  SpO2 100 %  O2 Device Room Air  Weight 92.9 kg  Type of Weight Post-Dialysis  Oxygen  Therapy  Patient Activity (if Appropriate) In bed  Pulse Oximetry Type Continuous  Oximetry Probe Site Changed No  During Treatment Monitoring  HD Safety Checks Performed Yes  Intra-Hemodialysis Comments Tolerated well;Tx completed  Dialysis Fluid Bolus Normal Saline   Received patient in bed to unit.  Alert and oriented.  Informed consent signed and in chart.   TX duration:3.5  Patient tolerated well.  Transported back to the room  Alert, without acute distress.  Hand-off given to patient's nurse.   Access used: Yes Access issues: Yes, pt. Clotted off the machine and received Heparin  2000 units per order, with no prevail the pt would not run even after flushing the lines   Total UF removed: 2600 Medication(s) given: See MAR Post HD VS: See Above Grid Post HD weight: 92.9 kg   [facility]

## 2023-07-11 NOTE — Discharge Instructions (Signed)
   [employer] BENEFITS:  -Covers 30 one-way trips (up to 50 miles one way) a year to or from approved health-related locations. To request transportation [facility]), please call [phone].

## 2023-07-11 NOTE — Hospital Course (Addendum)
 Tiffany [name] is a 63 y.o. female with medical history significant of ESRD on HD TTS, chronic diastolic congestive heart failure, schizoaffective disorder, anemia of chronic medical/renal disease, OSA, GERD, neuropathy, anxiety/depression, active tobacco abuse, history of seizure disorder, and recent admission on 07/07/2023 for E. Coli and UTI and HD at that time p/w BLE swelling and hyponatremia c/w volume overload and need for HD. Nephrology consulted and dialyze the patient yesterday and and again today. [name] stable for D/C and Nephrology cleared her.  There are no other outpatient clinic options available for this patient and patient was told to resume dialysis at her home clinic.  The renal navigator contacted her dialysis center.  Patient does not like going to her dialysis center but has no other options.  [name] medically stable for discharge and can be followed in the outpatient setting for maintenance hemodialysis and follow up with PCP within 1-2 weeks.  Assessment and Plan:  Volume overload 2/2 ESRD ESRD on HD / Metabolic Acidosis  -BUN/Cr Trend: Recent Labs  Lab 07/04/23 0523 07/05/23 0530 07/07/23 0820 07/10/23 0717 07/11/23 1017 07/12/23 0425   BUN 53* 68* 56* 49* 29* 26*   CREATININE 4.15* 4.87* 4.52* 5.01* 3.29* 2.53*   -C/w PTA calcitriol  0.48mcg daily -Strict I&Os and daily weights (standing preferred) -Renally dose medications for CrCl  -Avoid Enoxaparin , Avoid Nephrotoxic Medications such as NSAIDs, Morphine , Fleet's Phosphate Enema, Regular Insulin  Contrast Dyes and Gadolinium for MRI to avoid Nephrogenic systemic fibrosis; Avoid Hypotension and Dehydration to Ensure Adequate Renal Perfusion and will need to Renally Adjust Meds. CTM and Trend Renal Function carefully and repeat CMP in the AM  -Nephrology following and dialyzed the patient 2x while hospitalized  -PT recommends no follow-up  Essential HTN: C/w Amlodipine  5 mg po BID and Carvedilol  12.5 mg po BID. CTM BP  per Protocol. Last BP reading was 132/87  Anxiety/Hx of Manic Depression/Schizoaffective Disorder/Mood Disorder: C/w Duloxetine  30 mg po Daily, Escitalopram  10 mg po Daily, Prazosin  2 mg po qHS, Lamotrigine  100 mg po at bedtime, Mirazepine 15 mg po at bedtime, and Quetiapine  50 mg po BID   COPD: Not in Exacerbation. C/w Breztri  and Duonebs 3 mL q6hprn Wheezing and SOB  Hyponatremia: Fluctuating and likely to be corrected in Dialysis. Na+ went from 127 -> 122 -> 128. -> 129 CTM and Trend and repeat CMP in the AM  DM Type 2 complicated by Neuropathy: Check HbA1c in the AM. If necessary will place on Very Sensitive Novolog  SSI AC. C/w Gabapentin  100 mg po qHS   HLD/Dyslipidemia: C/w Atrovastatin 20 mg po Daily and Ezetemibe 10 mg po Daily   Normocytic Anemia/Anemia of Chronic Kidney Disease: Hgb/Hct went from 7.7/25.0 -> 8.0/25.4 -> 7.3/23.2. Check Anemia Panel in the AM. CTM for S/Sx of Bleeding; No overt bleeding noted. Repeat CBC in the AM  GERD/GI Prophylaxis: Continue PPI w/ Pantoprazole  40 mg po Daily   Hypoalbuminemia: Patient's Albumin  Trend ranging from 2.4-3.2. CTM and Trend and repeat CMP in the AM  Class II Obesity: Complicates overall prognosis and care. Estimated body mass index is 37.46 kg/m as calculated from the following:   Height as of this encounter: 5' 2 (1.575 m).   Weight as of this encounter: 92.9 kg. Weight Loss and Dietary Counseling given

## 2023-07-11 NOTE — Progress Notes (Addendum)
 Pt's referral to [facility] for new HD clinic placement in [place] is still pending (awaiting determination). Pt can receive care at [facility] T[facility] 10:25 am chair time but pt refuses to return to this clinic and no other [facility] clinic can accept pt at this time. Pt has been advised to return to clinic and work on transfer from there and pt refuses. Will assist as needed.   Randine Mungo Renal Navigator [phone]  Addendum at 4:07 pm: Pt was denied by [facility] and pt's referral cancelled by [facility] for this reason. Update provided to nephrologist.

## 2023-07-11 NOTE — Plan of Care (Signed)

## 2023-07-12 ENCOUNTER — Inpatient Hospital Stay (HOSPITAL_COMMUNITY)

## 2023-07-12 DIAGNOSIS — Z992 Dependence on renal dialysis: Secondary | ICD-10-CM | POA: Diagnosis not present

## 2023-07-12 DIAGNOSIS — N186 End stage renal disease: Secondary | ICD-10-CM | POA: Diagnosis not present

## 2023-07-12 LAB — CBC WITH DIFFERENTIAL/PLATELET
Abs Immature Granulocytes: 0.04 10*3/uL (ref 0.00–0.07)
Basophils Absolute: 0 10*3/uL (ref 0.0–0.1)
Basophils Relative: 0 %
Eosinophils Absolute: 0.3 10*3/uL (ref 0.0–0.5)
Eosinophils Relative: 3 %
HCT: 23.2 % — ABNORMAL LOW (ref 36.0–46.0)
Hemoglobin: 7.3 g/dL — ABNORMAL LOW (ref 12.0–15.0)
Immature Granulocytes: 1 %
Lymphocytes Relative: 15 %
Lymphs Abs: 1.1 10*3/uL (ref 0.7–4.0)
MCH: 29.4 pg (ref 26.0–34.0)
MCHC: 31.5 g/dL (ref 30.0–36.0)
MCV: 93.5 fL (ref 80.0–100.0)
Monocytes Absolute: 0.6 10*3/uL (ref 0.1–1.0)
Monocytes Relative: 8 %
Neutro Abs: 5.8 10*3/uL (ref 1.7–7.7)
Neutrophils Relative %: 73 %
Platelets: 245 10*3/uL (ref 150–400)
RBC: 2.48 MIL/uL — ABNORMAL LOW (ref 3.87–5.11)
RDW: 14.2 % (ref 11.5–15.5)
WBC: 7.8 10*3/uL (ref 4.0–10.5)
nRBC: 0 % (ref 0.0–0.2)

## 2023-07-12 LAB — COMPREHENSIVE METABOLIC PANEL WITH GFR
ALT: 9 U/L (ref 0–44)
AST: 14 U/L — ABNORMAL LOW (ref 15–41)
Albumin: 2.8 g/dL — ABNORMAL LOW (ref 3.5–5.0)
Alkaline Phosphatase: 74 U/L (ref 38–126)
Anion gap: 10 (ref 5–15)
BUN: 26 mg/dL — ABNORMAL HIGH (ref 8–23)
CO2: 26 mmol/L (ref 22–32)
Calcium: 8.4 mg/dL — ABNORMAL LOW (ref 8.9–10.3)
Chloride: 93 mmol/L — ABNORMAL LOW (ref 98–111)
Creatinine, Ser: 2.53 mg/dL — ABNORMAL HIGH (ref 0.44–1.00)
GFR, Estimated: 21 mL/min — ABNORMAL LOW (ref >60)
Glucose, Bld: 96 mg/dL (ref 70–99)
Potassium: 3.3 mmol/L — ABNORMAL LOW (ref 3.5–5.1)
Sodium: 129 mmol/L — ABNORMAL LOW (ref 135–145)
Total Bilirubin: 0.2 mg/dL (ref 0.0–1.2)
Total Protein: 5.6 g/dL — ABNORMAL LOW (ref 6.5–8.1)

## 2023-07-12 LAB — PHOSPHORUS: Phosphorus: 2.9 mg/dL (ref 2.5–4.6)

## 2023-07-12 LAB — MAGNESIUM: Magnesium: 1.7 mg/dL (ref 1.7–2.4)

## 2023-07-12 MED ORDER — POTASSIUM CHLORIDE CRYS ER 20 MEQ PO TBCR
40.0000 meq | EXTENDED_RELEASE_TABLET | Freq: Once | ORAL | Status: AC
Start: 2023-07-12 — End: 2023-07-12
  Administered 2023-07-12: 40 meq via ORAL
  Filled 2023-07-12: qty 2

## 2023-07-12 NOTE — Evaluation (Signed)
 Occupational Therapy Evaluation Patient Details Name: Tiffany [name] MRN: [id number] DOB: [birth year] Today's Date: 07/12/2023   History of Present Illness   is a 63 y.o. female admitted due to need for HD as she wont return to regular HD center for unclear reasons. PMH: ESRD on HD TTS, chronic diastolic congestive heart failure, schizoaffective disorder, anemia of chronic medical/renal disease, OSA, GERD, neuropathy, anxiety/depression, active tobacco abuse, history of seizure disorder, and recent admission on 07/07/2023 for E. Coli and UTI and HD     Clinical Impressions Pt c/o B knee pain 8/10, bathing at bedside with NT upon arrival. Pt lives alone, states she was mod I for ADLs and mobility with RW and electric w/c, two falls in the last year resulting in her calling EMS. Pt reports she is currently at baseline but her knees have been hurting more, but is still able to transfer and has been ambulating short distances to toilet. Pt completed bathing and dressing at bedside with lateral leans. Pt denies need for OT services while in hospital, states she already has [facility] assistance set up, no DME needs. Signing off. Please reach out if further services are needed or CLOF changes.      If plan is discharge home, recommend the following:   Assist for transportation;Assistance with cooking/housework     Functional Status Assessment   Patient has had a recent decline in their functional status and demonstrates the ability to make significant improvements in function in a reasonable and predictable amount of time.     Equipment Recommendations   None recommended by OT     Recommendations for Other Services         Precautions/Restrictions   Precautions Precautions: Fall Recall of Precautions/Restrictions: Intact Restrictions Weight Bearing Restrictions Per Provider Order: No     Mobility Bed Mobility Overal bed mobility: Modified Independent                   Transfers Overall transfer level: Needs assistance   Transfers: Sit to/from Stand Sit to Stand: Contact guard assist           General transfer comment: simulated pulling self up at sink, contact guard for safety, no physical assist      Balance Overall balance assessment: Needs assistance Sitting-balance support: No upper extremity supported, Feet supported Sitting balance-Leahy Scale: Good Sitting balance - Comments: EOB ADLs, mod I                                   ADL either performed or assessed with clinical judgement   ADL Overall ADL's : At baseline;Needs assistance/impaired                                       General ADL Comments: Pt reports she feels at baseline, takes care of herself at home mod I with electric w/c and RW, NT provided set up for bed level sponge bathing but Pt states she gets up and goes to bathroom with RW alone, declined OOB activities today but was able to complete ADLs sitting at bedside.     Vision Baseline Vision/History: 1 Wears glasses Ability to See in Adequate Light: 0 Adequate Patient Visual Report: No change from baseline       Perception         Praxis  Pertinent Vitals/Pain Pain Assessment Pain Assessment: Faces Pain Score: 8  Pain Location: bilat Knees Pain Descriptors / Indicators: Numbness, Tingling, Discomfort, Aching Pain Intervention(s): Monitored during session     Extremity/Trunk Assessment             Communication Communication Communication: No apparent difficulties   Cognition Arousal: Alert Behavior During Therapy: Flat affect Cognition: No apparent impairments                               Following commands: Intact       Cueing  General Comments   Cueing Techniques: Verbal cues      Exercises     Shoulder Instructions      Home Living Family/patient expects to be discharged to:: Private residence Living Arrangements:  Alone Available Help at Discharge: Family;Available PRN/intermittently Type of Home: House Home Access: Ramped entrance     Home Layout: One level     Bathroom Shower/Tub: Sponge bathes at baseline   Bathroom Toilet: Standard Bathroom Accessibility: Yes How Accessible: Accessible via walker Home Equipment: BSC/3in1;Hospital bed;Wheelchair - Surveyor, quantity (2 wheels)          Prior Functioning/Environment Prior Level of Function : Needs assist             Mobility Comments: pt reports having transportation to/from HD, completes std pvt transfer from bed to electric w/c and states If I don't feel good, I dont transfer ADLs Comments: pt reports sponge bathing at kitchen sink because she can't get her w/c in the bathroom. reports wearing depends and standing at sink to change them, reports ordering food in and doesn't cook for herself    OT Problem List: Decreased strength;Impaired balance (sitting and/or standing);Pain;Obesity   OT Treatment/Interventions:        OT Goals(Current goals can be found in the care plan section)   Acute Rehab OT Goals Patient Stated Goal: to stay here until finds new HD spot not in [place] OT Goal Formulation: With patient Time For Goal Achievement: 07/26/23 Potential to Achieve Goals: Good   OT Frequency:       Co-evaluation              AM-PAC OT 6 Clicks Daily Activity     Outcome Measure Help from another person eating meals?: None Help from another person taking care of personal grooming?: A Little Help from another person toileting, which includes using toliet, bedpan, or urinal?: A Little Help from another person bathing (including washing, rinsing, drying)?: A Little Help from another person to put on and taking off regular upper body clothing?: A Little Help from another person to put on and taking off regular lower body clothing?: A Little 6 Click Score: 19   End of Session Nurse Communication: Mobility  status  Activity Tolerance: Patient tolerated treatment well Patient left: in bed;with call bell/phone within reach  OT Visit Diagnosis: Other abnormalities of gait and mobility (R26.89);Muscle weakness (generalized) (M62.81);Pain Pain - part of body: Knee                Time: 8885-8867 OT Time Calculation (min): 18 min Charges:  OT General Charges $OT Visit: 1 Visit OT Evaluation $OT Eval Low Complexity: 1 Low  [address] OTR/L   Elouise JONELLE Bott 07/12/2023, 11:36 AM

## 2023-07-12 NOTE — Plan of Care (Signed)
  Problem: Education: Goal: Knowledge of General Education information will improve Description: Including pain rating scale, medication(s)/side effects and non-pharmacologic comfort measures Outcome: Adequate for Discharge   Problem: Health Behavior/Discharge Planning: Goal: Ability to manage health-related needs will improve Outcome: Adequate for Discharge   Problem: Clinical Measurements: Goal: Ability to maintain clinical measurements within normal limits will improve Outcome: Adequate for Discharge Goal: Will remain free from infection Outcome: Adequate for Discharge Goal: Diagnostic test results will improve Outcome: Adequate for Discharge Goal: Respiratory complications will improve Outcome: Adequate for Discharge Goal: Cardiovascular complication will be avoided Outcome: Adequate for Discharge   Problem: Activity: Goal: Risk for activity intolerance will decrease Outcome: Adequate for Discharge   Problem: Nutrition: Goal: Adequate nutrition will be maintained Outcome: Adequate for Discharge   Problem: Coping: Goal: Level of anxiety will decrease Outcome: Adequate for Discharge   Problem: Elimination: Goal: Will not experience complications related to bowel motility Outcome: Adequate for Discharge Goal: Will not experience complications related to urinary retention Outcome: Adequate for Discharge   Problem: Pain Managment: Goal: General experience of comfort will improve and/or be controlled Outcome: Adequate for Discharge   Problem: Safety: Goal: Ability to remain free from injury will improve Outcome: Adequate for Discharge   Problem: Skin Integrity: Goal: Risk for impaired skin integrity will decrease Outcome: Adequate for Discharge   Problem: Acute Rehab PT Goals(only PT should resolve) Goal: Patient Will Transfer Sit To/From Stand Outcome: Adequate for Discharge Goal: Pt Will Transfer Bed To Chair/Chair To Bed Outcome: Adequate for Discharge Goal:  Pt Will Perform Standing Balance Or Pre-Gait Outcome: Adequate for Discharge

## 2023-07-12 NOTE — Progress Notes (Addendum)
 Met with pt at bedside to be advised that pt was not accepted as a pt at any [facility] clinic in [place] and pt's referral was cancelled by [facility] for this reason. Discussed with pt the option of resuming at [facility] and working on clinic transfer from there. Pt is not agreeable to this and does not plan to receive out-pt HD at [facility] at d/c. Update provided to attending, nephrologist, pt's RN, RN CM,and [name]. Update provided to HD clinic as well. At this time, there are no other out-pt clinic options available if pt is unwilling to resume at her clinic. Will assist as needed.   Randine Mungo Renal Navigator [phone]  Addendum at 3:36 pm: Contacted [facility] to be advised of pt's d/c today from hospital.

## 2023-07-12 NOTE — TOC Transition Note (Signed)
 Transition of Care [facility]) - Discharge Note   Patient Details  Name: Tiffany [name] MRN: [id number] Date of Birth: May 18, 1960  [facility] CM/SW Contact:  Roxie KANDICE Stain, RN Phone Number: 07/12/2023, 4:13 PM   Clinical Narrative:    Patient stable for discharge. Patient is aware that Randine , renal navigator was unable to find different OP dialysis center. Safe transport called for discharge home. Address confirmed.    Final next level of care: Home/Self Care Barriers to Discharge: Barriers Resolved   Patient Goals and CMS Choice Patient states their goals for this hospitalization and ongoing recovery are:: to switch outpatient dialysis clinics          Discharge Placement                   Home    Discharge Plan and Services Additional resources added to the After Visit Summary for                                       Social Drivers of Health (SDOH) Interventions SDOH Screenings   Food Insecurity: Food Insecurity Present (07/10/2023)  Housing: Low Risk  (07/10/2023)  Transportation Needs: No Transportation Needs (07/10/2023)  Utilities: Not At Risk (07/10/2023)  Alcohol Screen: Low Risk  (11/10/2022)  Depression (PHQ2-9): Low Risk  (07/11/2022)  Financial Resource Strain: Low Risk  (07/11/2022)  Physical Activity: Inactive (07/11/2022)  Social Connections: Socially Isolated (06/28/2023)  Stress: Stress Concern Present (07/11/2022)  Tobacco Use: Medium Risk (07/10/2023)     Readmission Risk Interventions    06/29/2023    3:13 PM 06/05/2022    2:47 PM 02/07/2022    2:22 PM  Readmission Risk Prevention Plan  Transportation Screening Complete Complete Complete  Medication Review (RN Care Manager) Referral to Pharmacy Referral to Pharmacy Complete  PCP or Specialist appointment within 3-5 days of discharge Complete Complete Complete  HRI or Home Care Consult Complete Complete Complete  SW Recovery Care/Counseling Consult Complete Complete  Complete  Palliative Care Screening Not Applicable Not Applicable Not Applicable  Skilled Nursing Facility Not Applicable Not Applicable Not Applicable

## 2023-07-12 NOTE — Progress Notes (Signed)
 [facility] Progress Note  Subjective:  Seen in room Feeling better 2.5 L off w/ HD yest  Vitals:   07/11/23 1944 07/11/23 2208 07/12/23 0810 07/12/23 0819  BP: 132/87 114/63  115/64  Pulse: 80 79 77 76  Resp: 16  16 16   Temp: 99 F (37.2 C)   98 F (36.7 C)  TempSrc: Oral   Oral  SpO2: 97%  98% 99%  Weight:      Height:        Exam: Gen alert, no distress, on RA, stable No jvd or bruits Chest clear bilat to bases, no rales/ wheezing RRR no MRG Abd soft ntnd no mass or ascites +bs Ext LE edema resolved Neuro is alert, Ox 3 , nf    RIJ TDC intact    [facility]--> 4h  B500  83kg  RIJ TDC  Heparin  none Last OP HD 05/30/23, post wt 85.2kg (dry wt 83.5kg) Very good compliance up until 5/14, since then has only had inpatient HD    Assessment/ Plan: Volume overload: not sure due to excessive fluid intake vs lean body wt loss.  Needs a standing wt. She had HD here x 2 now and is off O2 and edema has resolved. OK for dc from renal standpoint.  Hyponatremia: due to vol overload in esrd pt. Resolved w/ dialysis.  ESRD: on HD TTS. Refuses to go to her OP unit for HD. Bad situation but she is not reasonable and won't discuss it. Other dialysis companies have been asked to take her and have turned her down ([facility], [facility]).  HTN: bp's 120-130, resume OP BP meds as needed.  Anemia of esrd: Hb 7- 9 here, follow, transfuse prn .  Secondary hyperparathyroidism: CCa and phos in range. Not sure if taking binder.  Dispo: consider dc today       Myer Fret MD  [facility] 07/12/2023, 2:04 PM  Recent Labs  Lab 07/11/23 1017 07/12/23 0425  HGB 8.4* 7.3*  ALBUMIN  3.2* 2.8*  CALCIUM  8.6* 8.4*  PHOS 3.9 2.9  CREATININE 3.29* 2.53*  K 3.5 3.3*   No results for input(s): IRON , TIBC, FERRITIN in the last 168 hours. Inpatient medications:  amLODipine   5 mg Oral BID   atorvastatin   20 mg Oral Daily   budesonide -glycopyrrolate -formoterol   2 puff  Inhalation BID   calcitRIOL   0.5 mcg Oral Daily   carvedilol   12.5 mg Oral BID   Chlorhexidine  Gluconate Cloth  6 each Topical Q0600   Chlorhexidine  Gluconate Cloth  6 each Topical Q0600   DULoxetine   30 mg Oral Daily   escitalopram   10 mg Oral Daily   ezetimibe   10 mg Oral Daily   gabapentin   100 mg Oral QHS   heparin   5,000 Units Subcutaneous Q8H   lamoTRIgine   100 mg Oral QHS   mirtazapine   15 mg Oral QHS   pantoprazole   40 mg Oral Daily   prazosin   2 mg Oral QHS   QUEtiapine   50 mg Oral BID    diphenhydrAMINE , fentaNYL  (SUBLIMAZE ) injection, ipratropium-albuterol 

## 2023-07-13 ENCOUNTER — Other Ambulatory Visit (HOSPITAL_COMMUNITY): Payer: Self-pay

## 2023-07-13 ENCOUNTER — Other Ambulatory Visit: Payer: Self-pay

## 2023-07-14 ENCOUNTER — Encounter (HOSPITAL_COMMUNITY): Payer: Self-pay

## 2023-07-14 ENCOUNTER — Emergency Department (HOSPITAL_COMMUNITY)
Admission: EM | Admit: 2023-07-14 | Discharge: 2023-07-14 | Disposition: A | Discharge status: Home / Self Care | Attending: Emergency Medicine | Admitting: Emergency Medicine

## 2023-07-14 ENCOUNTER — Other Ambulatory Visit: Payer: Self-pay

## 2023-07-14 DIAGNOSIS — E1140 Type 2 diabetes mellitus with diabetic neuropathy, unspecified: Secondary | ICD-10-CM | POA: Diagnosis not present

## 2023-07-14 DIAGNOSIS — I509 Heart failure, unspecified: Secondary | ICD-10-CM | POA: Diagnosis not present

## 2023-07-14 DIAGNOSIS — I132 Hypertensive heart and chronic kidney disease with heart failure and with stage 5 chronic kidney disease, or end stage renal disease: Secondary | ICD-10-CM | POA: Diagnosis not present

## 2023-07-14 DIAGNOSIS — J4489 Other specified chronic obstructive pulmonary disease: Secondary | ICD-10-CM | POA: Diagnosis not present

## 2023-07-14 DIAGNOSIS — Z79899 Other long term (current) drug therapy: Secondary | ICD-10-CM | POA: Insufficient documentation

## 2023-07-14 DIAGNOSIS — N186 End stage renal disease: Secondary | ICD-10-CM | POA: Diagnosis not present

## 2023-07-14 DIAGNOSIS — E1122 Type 2 diabetes mellitus with diabetic chronic kidney disease: Secondary | ICD-10-CM | POA: Insufficient documentation

## 2023-07-14 DIAGNOSIS — Z91010 Allergy to peanuts: Secondary | ICD-10-CM | POA: Insufficient documentation

## 2023-07-14 DIAGNOSIS — Z992 Dependence on renal dialysis: Secondary | ICD-10-CM | POA: Diagnosis present

## 2023-07-14 DIAGNOSIS — E871 Hypo-osmolality and hyponatremia: Secondary | ICD-10-CM | POA: Insufficient documentation

## 2023-07-14 LAB — COMPREHENSIVE METABOLIC PANEL WITH GFR
ALT: 8 U/L (ref 0–44)
AST: 14 U/L — ABNORMAL LOW (ref 15–41)
Albumin: 3.2 g/dL — ABNORMAL LOW (ref 3.5–5.0)
Alkaline Phosphatase: 71 U/L (ref 38–126)
Anion gap: 11 (ref 5–15)
BUN: 55 mg/dL — ABNORMAL HIGH (ref 8–23)
CO2: 23 mmol/L (ref 22–32)
Calcium: 9 mg/dL (ref 8.9–10.3)
Chloride: 94 mmol/L — ABNORMAL LOW (ref 98–111)
Creatinine, Ser: 4.39 mg/dL — ABNORMAL HIGH (ref 0.44–1.00)
GFR, Estimated: 11 mL/min — ABNORMAL LOW (ref >60)
Glucose, Bld: 88 mg/dL (ref 70–99)
Potassium: 3.6 mmol/L (ref 3.5–5.1)
Sodium: 128 mmol/L — ABNORMAL LOW (ref 135–145)
Total Bilirubin: 0.4 mg/dL (ref 0.0–1.2)
Total Protein: 5.9 g/dL — ABNORMAL LOW (ref 6.5–8.1)

## 2023-07-14 LAB — CBC WITH DIFFERENTIAL/PLATELET
Abs Immature Granulocytes: 0.05 10*3/uL (ref 0.00–0.07)
Basophils Absolute: 0 10*3/uL (ref 0.0–0.1)
Basophils Relative: 1 %
Eosinophils Absolute: 0.3 10*3/uL (ref 0.0–0.5)
Eosinophils Relative: 4 %
HCT: 22.3 % — ABNORMAL LOW (ref 36.0–46.0)
Hemoglobin: 6.9 g/dL — CL (ref 12.0–15.0)
Immature Granulocytes: 1 %
Lymphocytes Relative: 20 %
Lymphs Abs: 1.5 10*3/uL (ref 0.7–4.0)
MCH: 29.5 pg (ref 26.0–34.0)
MCHC: 30.9 g/dL (ref 30.0–36.0)
MCV: 95.3 fL (ref 80.0–100.0)
Monocytes Absolute: 0.6 10*3/uL (ref 0.1–1.0)
Monocytes Relative: 8 %
Neutro Abs: 4.9 10*3/uL (ref 1.7–7.7)
Neutrophils Relative %: 66 %
Platelets: 258 10*3/uL (ref 150–400)
RBC: 2.34 MIL/uL — ABNORMAL LOW (ref 3.87–5.11)
RDW: 14.6 % (ref 11.5–15.5)
WBC: 7.4 10*3/uL (ref 4.0–10.5)
nRBC: 0 % (ref 0.0–0.2)

## 2023-07-14 NOTE — Discharge Instructions (Signed)
 Please call the dialysis center rescreened preoperative scheduling appointment.  Otherwise please discuss with your primary care doctor for further options.

## 2023-07-14 NOTE — ED Provider Notes (Signed)
 [place] [facility] DE[facility]MENT AT [facility] Provider Note   CSN: [id number] Arrival date & time: 07/14/23  9377     Patient presents with: Needs Dialysis   Tiffany [name] is a 63 y.o. female with history of ESRD on HD presents requesting dialysis.  She states the last time she had completed dialysis was on Wednesday.  [name] typically Tuesday Thursday Saturday.  She does report that she  HPI    Past Medical History:  Diagnosis Date   Abdominal pain    Acute encephalopathy 11/21/2017   Acute exacerbation of CHF (congestive heart failure) (HCC) 03/03/2022   Acute GI bleeding 03/29/2021   Acute metabolic encephalopathy 02/20/2020   Aggressive behavior    Agitation 11/22/2017   Anoxic brain injury (HCC) 09/08/2016   C. Arrest due to respiratory failure and COPD exacerbation   Anxiety    Arthritis    all over (04/10/2016)   Asthma 10/18/2010   Binge eating disorder    Blood loss anemia 04/24/2011   CAP (community acquired pneumonia) 06/22/2015   Cardiac arrest (HCC) 09/08/2016   PEA   Carotid artery stenosis    1-39% bilateral by dopplers 11/2016   Chronic diastolic (congestive) heart failure (HCC)    Chronic pain syndrome 06/18/2012   Chronic post-traumatic stress disorder (PTSD) 05/27/2018   Chronic respiratory failure with hypoxia and hypercapnia (HCC) 06/22/2015   TRILOGY Vent >AVAPA-ES., Vt target 200-400, Max P 30 , PS max 20 , PS min 6-10 , E Max 6, E Min 4, Rate Auto AVAPS Rate 2 (titrate for pt comfort) , bleed O2 at 5l/m continuous flow .    Closed displaced fracture of fifth metacarpal bone 03/21/2018   Cocaine use disorder, severe, in sustained remission (HCC) 12/17/2015   Complication of anesthesia    decreased bp, decreased heart rate   COPD (chronic obstructive pulmonary disease) (HCC) 07/08/2014   Delusional disorder, persecutory type (HCC) 06/26/2021   Depression    Diabetic neuropathy (HCC) 04/24/2011   Difficulty with speech 01/24/2018    Disorder of nervous system    Drug abuse (HCC) 11/21/2017   Dyslipidemia 04/24/2011   E. coli UTI 02/20/2020   Elevated troponin 04/28/2012   Emphysema    Encephalopathy 11/21/2017   Essential hypertension 03/22/2016   Facet arthropathy, lumbar 06/12/2022   Fibula fracture 07/10/2016   Frequent falls 10/11/2017   GERD (gastroesophageal reflux disease)    GI bleed 03/30/2021   Gout 04/11/2017   Heart attack (HCC) 1980s   Hematochezia    History of blood transfusion 1994   couldn't stop bleeding from my period   History of drug abuse in remission (HCC) 11/28/2015   Quit in 2017   Hyperlipidemia 03/31/2021   Hyperlipidemia LDL goal <70    Hypocalcemia    Hypokalemia    Hypomagnesemia    Incontinence    Manic depression (HCC)    Morbid obesity (HCC) 10/18/2010   Obstructive sleep apnea 10/18/2010   On home oxygen  therapy    6L; 24/7 (04/10/2016)   OSA on CPAP    wear mask sometimes (04/10/2016)   Painless rectal bleeding 12/31/2015   Paranoid (HCC)    sometimes; I'm on RX for it (04/10/2016)   Periumbilical abdominal pain    Prolonged Q-T interval on ECG    Rectal bleeding 12/31/2015   Recurrent syncope 07/09/2021   Rhabdomyolysis 06/16/2021   Schizoaffective disorder, bipolar type (HCC) 04/05/2018   Seasonal allergies    Seborrheic keratoses 12/31/2013  Seizures (HCC)    don't know what kind; last one was ~ 1 yr ago (04/10/2016)   Sinus bradycardia    Skin ulcer of sacrum, limited to breakdown of skin (HCC) 03/08/2022   Spondylolisthesis at L3-L4 level 06/12/2022   Stroke [facility]) 1980s   denies residual on 04/10/2016   Thrush 09/19/2013   Type 2 diabetes mellitus (HCC) 10/18/2010   Past Surgical History:  Procedure Laterality Date   A/V FISTULAGRAM N/A 02/01/2023   Procedure: A/V Fistulagram;  Surgeon: Norine Manuelita LABOR, MD;  Location: [facility] INVASIVE CV LAB;  Service: Cardiovascular;  Laterality: N/A;   AV FISTULA PLACEMENT Left 12/21/2022   Procedure:  INSERTION OF LEFT ARM ARTERIOVENOUS (AV) GORE-TEX GRAFT;  Surgeon: Magda Debby SAILOR, MD;  Location: [facility] OR;  Service: Vascular;  Laterality: Left;   CESAREAN SECTION  1997   COLONOSCOPY WITH PROPOFOL  N/A 04/01/2021   Procedure: COLONOSCOPY WITH PROPOFOL ;  Surgeon: Rollin Dover, MD;  Location: [facility] ENDOSCOPY;  Service: Gastroenterology;  Laterality: N/A;  Rectal bleeding with drop in hemoglobin to 7.2 g/dL   HERNIA REPAIR     IR CHOLANGIOGRAM EXISTING TUBE  07/20/2016   IR PERC CHOLECYSTOSTOMY  05/10/2016   IR RADIOLOGIST EVAL & MGMT  06/08/2016   IR RADIOLOGIST EVAL & MGMT  06/29/2016   IR SINUS/FIST TUBE CHK-NON GI  07/12/2016   RIGHT/LEFT HEART CATH AND CORONARY ANGIOGRAPHY N/A 06/19/2017   Procedure: RIGHT/LEFT HEART CATH AND CORONARY ANGIOGRAPHY;  Surgeon: Cherrie Toribio SAUNDERS, MD;  Location: [facility] INVASIVE CV LAB;  Service: Cardiovascular;  Laterality: N/A;   TIBIA IM NAIL INSERTION Right 07/12/2016   Procedure: INTRAMEDULLARY (IM) NAIL RIGHT TIBIA;  Surgeon: Jerri Kay HERO, MD;  Location: [facility] OR;  Service: Orthopedics;  Laterality: Right;   UMBILICAL HERNIA REPAIR  ~ 1963   that's why I don't have a belly button   VAGINAL HYSTERECTOMY       Prior to Admission medications   Medication Sig Start Date End Date Taking? Authorizing Provider  albuterol  (VENTOLIN  HFA) 108 (90 Base) MCG/ACT inhaler Inhale 2 puffs into the lungs every 6 (six) hours as needed for wheezing or shortness of breath. 07/07/23   [place], Camellia PARAS, DO  amLODipine  (NORVASC ) 10 MG tablet Take 0.5 tablets (5 mg total) by mouth 2 (two) times daily. 07/07/23 10/05/23  [place], Camellia PARAS, DO  Asenapine  Maleate (SAPHRIS ) 10 MG SUBL Place 10 mg under the tongue at bedtime.    [provider]  atorvastatin  (LIPITOR ) 20 MG tablet Take 1 tablet (20 mg total) by mouth daily. 07/07/23 10/05/23  [place], Camellia PARAS, DO  calcitRIOL  (ROCALTROL ) 0.5 MCG capsule Take 1 capsule (0.5 mcg total) by mouth daily. 07/07/23   [place], Camellia PARAS, DO   carvedilol  (COREG ) 12.5 MG tablet Take 1 tablet (12.5 mg total) by mouth 2 (two) times daily. 07/07/23 10/11/23  [place], Eric J, DO  cyclobenzaprine  (FLEXERIL ) 10 MG tablet Take 1 tablet (10 mg total) by mouth 3 (three) times daily. 10/09/22   McDiarmid, Krystal BIRCH, MD  dicyclomine  (BENTYL ) 10 MG capsule Take 1 capsule (10 mg total) by mouth 3 (three) times daily. 07/07/23   [place], Camellia PARAS, DO  DULoxetine  (CYMBALTA ) 30 MG capsule Take 1 capsule (30 mg total) by mouth daily. 07/07/23   [place], Camellia PARAS, DO  epoetin  alfa (EPOGEN ) 10000 UNIT/ML injection Inject 1 mL (10,000 Units total) into the vein every Monday, Wednesday, and Friday with hemodialysis. 11/22/22   Cam Charlie Loving, DO  escitalopram  (LEXAPRO ) 10 MG tablet  Take 1 tablet (10 mg total) by mouth daily. 07/07/23 10/05/23  [place], Camellia PARAS, DO  ezetimibe  (ZETIA ) 10 MG tablet Take 1 tablet (10 mg total) by mouth daily. Please call office to schedule an appt for further refills. Thank you 07/07/23   [place], Camellia PARAS, DO  ferrous sulfate  325 (65 FE) MG tablet Take 1 tablet (325 mg total) by mouth daily. 07/07/23 07/01/24  [place], Camellia PARAS, DO  Fluticasone -Umeclidin-Vilant (TRELEGY ELLIPTA ) 100-62.5-25 MCG/ACT AEPB Inhale 1 puff into the lungs daily. 07/07/23   [place], Camellia PARAS, DO  folic acid  (FOLVITE ) 1 MG tablet Take 1 tablet (1 mg total) by mouth daily. 07/07/23   [place], Camellia PARAS, DO  gabapentin  (NEURONTIN ) 100 MG capsule Take 1 capsule (100 mg total) by mouth at bedtime. 07/07/23 10/05/23  [place], Camellia PARAS, DO  hydrOXYzine  (ATARAX ) 10 MG tablet Take 1 tablet (10 mg total) by mouth 3 (three) times daily as needed for anxiety. Patient taking differently: Take 10 mg by mouth 3 (three) times daily. 03/14/23   Mannie Jerel PARAS, NP  lamoTRIgine  (LAMICTAL ) 100 MG tablet Take 1 tablet (100 mg total) by mouth at bedtime. 07/07/23 10/05/23  [place], Camellia PARAS, DO  lidocaine  (LIDODERM ) 5 % Place 1 patch onto the skin daily. Remove & Discard patch within 12 hours or as  directed by MD Patient taking differently: Place 1 patch onto the skin daily as needed (pain). 02/20/23   Singh, Prashant K, MD  loperamide  (IMODIUM ) 2 MG capsule Take 1 capsule (2 mg total) by mouth 2 (two) times daily. Patient taking differently: Take 2 mg by mouth 3 (three) times daily. 10/09/22   McDiarmid, Krystal BIRCH, MD  MELATONIN PO Take 1 tablet by mouth at bedtime.    [provider]  metolazone  (ZAROXOLYN ) 2.5 MG tablet Take 1 tablet (2.5 mg total) by mouth every Monday, Wednesday, and Friday. 07/09/23 10/07/23  [place], Eric J, DO  mirtazapine  (REMERON ) 7.5 MG tablet Take 2 tablets (15 mg total) by mouth at bedtime. 07/07/23 10/05/23  [place], Eric J, DO  Multiple Vitamins-Minerals (CENTRUM SILVER  50+WOMEN) TABS Take 1 tablet by mouth at bedtime.    [provider]  omeprazole  (PRILOSEC) 40 MG capsule Take 1 capsule (40 mg total) by mouth every morning. 07/07/23 10/05/23  [place], Camellia PARAS, DO  prazosin  (MINIPRESS ) 2 MG capsule Take 1 capsule (2 mg total) by mouth at bedtime. 02/26/23     QUEtiapine  (SEROQUEL ) 50 MG tablet Take 1 tablet (50 mg total) by mouth 2 (two) times daily. 07/07/23 10/05/23  [place], Camellia PARAS, DO  torsemide  (DEMADEX ) 20 MG tablet Take 1 tablet (20 mg total) by mouth 2 (two) times daily. Patient taking differently: No sig reported 02/13/23       Allergies: Asa [aspirin ], Deltasone  [prednisone ], Latuda [lurasidone hcl], Lortab [hydrocodone -acetaminophen ], Magnesium -containing compounds, Ultram  [tramadol ], Codeine, Desyrel  [trazodone ], Apresoline  [hydralazine ], Peanut (diagnostic), Topamax  [topiramate ], Sulfa antibiotics, and Tape    Review of Systems  All other systems reviewed and are negative.   Updated Vital Signs BP (!) 103/57 (BP Location: Right Arm)   Pulse 71   Temp 97.9 F (36.6 C) (Oral)   Resp 18   Ht 5' 2 (1.575 m)   Wt 93 kg   SpO2 99%   BMI 37.50 kg/m   Physical Exam Vitals and nursing note reviewed.  Constitutional:      General: [name]  not in acute distress.    Appearance: [name] well-developed.  HENT:     Head: Normocephalic  and atraumatic.   Eyes:     Conjunctiva/sclera: Conjunctivae normal.    Cardiovascular:     Rate and Rhythm: Normal rate and regular rhythm.     Heart sounds: No murmur heard. Pulmonary:     Effort: Pulmonary effort is normal. No respiratory distress.     Breath sounds: Normal breath sounds.  Abdominal:     Palpations: Abdomen is soft.     Tenderness: There is no abdominal tenderness.   Musculoskeletal:        General: Swelling present.     Cervical back: Neck supple.     Comments: Trace bilateral pitting edema   Skin:    General: Skin is warm and dry.     Capillary Refill: Capillary refill takes less than 2 seconds.   Neurological:     Mental Status: [name] alert.   Psychiatric:        Mood and Affect: Mood normal.     (all labs ordered are listed, but only abnormal results are displayed) Labs Reviewed  CBC WITH DIFFERENTIAL/PLATELET - Abnormal; Notable for the following components:      Result Value   RBC 2.34 (*)    Hemoglobin 6.9 (*)    HCT 22.3 (*)    All other components within normal limits  COMPREHENSIVE METABOLIC PANEL WITH GFR - Abnormal; Notable for the following components:   Sodium 128 (*)    Chloride 94 (*)    BUN 55 (*)    Creatinine, Ser 4.39 (*)    Total Protein 5.9 (*)    Albumin  3.2 (*)    AST 14 (*)    GFR, Estimated 11 (*)    All other components within normal limits    EKG: None  Radiology: No results found.    Procedures   Medications Ordered in the ED - No data to display                                  Medical Decision Making  This patient presents to the ED with chief complaint(s) of need for dialysis.  The complaint involves an extensive differential diagnosis and also carries with it a high risk of complications and morbidity.   Pertinent past medical history as listed in HPI  The differential diagnosis includes  Need for  dialysis, electrolyte abnormality, GI bleed Additional history obtained: Records reviewed Care Everywhere/External Records  Assessment and management:   Hemodynamically stable, nontoxic-appearing patient presenting for her routine dialysis.  She does report some bilateral lower extremity edema which is typical for her, otherwise she is without any complaints.  She has noted to have a hemoglobin of 6.9.  Was 7.32 days ago.  She denies any hematochezia or melena.  She does report that couple decades ago she did require transfusions.  She additionally has hyponatremia that appears to be near her baseline.   Independent ECG interpretation:  none  Independent labs interpretation:  The following labs were independently interpreted:  CMP with sodium of 128, appears to be near baseline, creatinine 4.39, CBC with hemoglobin of 6.9, was 7.32 days ago  Independent visualization and interpretation of imaging: I independently visualized the following imaging with scope of interpretation limited to determining acute life threatening conditions related to emergency care: none    Consultations obtained:   Nephrology Dr. Geralynn, recommended she follow-up with her outpatient dialysis.  States that she has a seat at outpatient dialysis, however  she frequently comes to the ED despite being told that dialysis here is only for emergencies.  Contacted dialysis center of [facility].  Patient is still cleared to attend their, however she has not been coming.  Patient reports that she is not interested in going there.  Recommended that she discuss with her primary care provider regarding her further options.  As she is unable to get dialysis today.  She has no indication for urgent dialysis today.  She will be discharged home, recommending follow-up with her outpatient clinic.  Disposition:   Patient will be discharged home. The patient has been appropriately medically screened and/or stabilized in the ED. I  have low suspicion for any other emergent medical condition which would require further screening, evaluation or treatment in the ED or require inpatient management. At time of discharge the patient is hemodynamically stable and in no acute distress. I have discussed work-up results and diagnosis with patient and answered all questions. Patient is agreeable with discharge plan. We discussed strict return precautions for returning to the emergency department and they verbalized understanding.     Social Determinants of Health:   none  This note was dictated with voice recognition software.  Despite best efforts at proofreading, errors may have occurred which can change the documentation meaning.       Final diagnoses:  ESRD needing dialysis [facility])    ED Discharge Orders     None          Tiffany [name] 07/14/23 1218    Emil Share, DO 07/14/23 1228

## 2023-07-14 NOTE — ED Triage Notes (Signed)
 Pt states she needs to get dialysis. Pt is on Tu, Th, Sat schedule. Last dialysis was 2 days ago. Pt denies any other concerns, pt does not have a dialysis center.

## 2023-07-15 NOTE — Discharge Summary (Signed)
 Physician Discharge Summary   Patient: Tiffany [name] MRN: [id number] DOB: 06-14-1960  Admit date:     07/10/2023  Discharge date: 07/12/2023  Discharge Physician: Alejandro Marker, DO   PCP: Ilah Crigler, MD   Follow-up with PCP within 1 to 2 weeks repeat CBC, CMP, mag, Phos within 1 week Follow-up with nephrology in outpatient setting continue maintenance hemodialysis at [facility] Tuesday Thursday Saturday  Discharge Diagnoses: Principal Problem:   ESRD (end stage renal disease) on dialysis [facility])  Resolved Problems:   * No resolved hospital problems. *  Hospital Course: [name] is a 63 y.o. female with medical history significant of ESRD on HD TTS, chronic diastolic congestive heart failure, schizoaffective disorder, anemia of chronic medical/renal disease, OSA, GERD, neuropathy, anxiety/depression, active tobacco abuse, history of seizure disorder, and recent admission on 07/07/2023 for E. Coli and UTI and HD at that time p/w BLE swelling and hyponatremia c/w volume overload and need for HD. Nephrology consulted and dialyze the patient yesterday and and again today. [name] stable for D/C and Nephrology cleared her.  There are no other outpatient clinic options available for this patient and patient was told to resume dialysis at her home clinic.  The renal navigator contacted her dialysis center.  Patient does not like going to her dialysis center but has no other options.  [name] medically stable for discharge and can be followed in the outpatient setting for maintenance hemodialysis and follow up with PCP within 1-2 weeks.  Assessment and Plan:  Volume overload 2/2 ESRD ESRD on HD / Metabolic Acidosis  -BUN/Cr Trend: Recent Labs  Lab 07/04/23 0523 07/05/23 0530 07/07/23 0820 07/10/23 0717 07/11/23 1017 07/12/23 0425   BUN 53* 68* 56* 49* 29* 26*   CREATININE 4.15* 4.87* 4.52* 5.01* 3.29* 2.53*   -C/w PTA calcitriol  0.37mcg daily -Strict I&Os and daily weights  (standing preferred) -Renally dose medications for CrCl  -Avoid Enoxaparin , Avoid Nephrotoxic Medications such as NSAIDs, Morphine , Fleet's Phosphate Enema, Regular Insulin  Contrast Dyes and Gadolinium for MRI to avoid Nephrogenic systemic fibrosis; Avoid Hypotension and Dehydration to Ensure Adequate Renal Perfusion and will need to Renally Adjust Meds. CTM and Trend Renal Function carefully and repeat CMP in the AM  -Nephrology following and dialyzed the patient 2x while hospitalized  -PT recommends no follow-up  Essential HTN: C/w Amlodipine  5 mg po BID and Carvedilol  12.5 mg po BID. CTM BP per Protocol. Last BP reading was 132/87  Anxiety/Hx of Manic Depression/Schizoaffective Disorder/Mood Disorder: C/w Duloxetine  30 mg po Daily, Escitalopram  10 mg po Daily, Prazosin  2 mg po qHS, Lamotrigine  100 mg po at bedtime, Mirazepine 15 mg po at bedtime, and Quetiapine  50 mg po BID   COPD: Not in Exacerbation. C/w Breztri  and Duonebs 3 mL q6hprn Wheezing and SOB  Hyponatremia: Fluctuating and likely to be corrected in Dialysis. Na+ went from 127 -> 122 -> 128. -> 129 CTM and Trend and repeat CMP in the AM  DM Type 2 complicated by Neuropathy: Check HbA1c in the AM. If necessary will place on Very Sensitive Novolog  SSI AC. C/w Gabapentin  100 mg po qHS   HLD/Dyslipidemia: C/w Atrovastatin 20 mg po Daily and Ezetemibe 10 mg po Daily   Normocytic Anemia/Anemia of Chronic Kidney Disease: Hgb/Hct went from 7.7/25.0 -> 8.0/25.4 -> 7.3/23.2. Check Anemia Panel in the AM. CTM for S/Sx of Bleeding; No overt bleeding noted. Repeat CBC in the AM  GERD/GI Prophylaxis: Continue PPI w/ Pantoprazole  40 mg po Daily  Hypoalbuminemia: Patient's Albumin  Trend ranging from 2.4-3.2. CTM and Trend and repeat CMP in the AM  Class II Obesity: Complicates overall prognosis and care. Estimated body mass index is 37.46 kg/m as calculated from the following:   Height as of this encounter: 5' 2 (1.575 m).   Weight as of  this encounter: 92.9 kg. Weight Loss and Dietary Counseling given  Consultants: Nephrology Procedures performed: As delineated as above Disposition: Home health Diet recommendation:  Discharge Diet Orders (From admission, onward)     Start     Ordered   07/12/23 0000  Diet - low sodium heart healthy       Comments: Renal Carb Modified Diet with 1200 mL Fluid restriction   07/12/23 1456           Renal diet DISCHARGE MEDICATION: Allergies as of 07/12/2023       Reactions   Dorethia Colla ] Other (See Comments)   GI bleed   Deltasone  [prednisone ] Anaphylaxis, Swelling, Other (See Comments)   Swelling of tongue, lips, throat   Latuda [lurasidone Hcl] Anaphylaxis   Lortab [hydrocodone -acetaminophen ] Hives, Shortness Of Breath   Magnesium -containing Compounds Anaphylaxis, Other (See Comments)   Tolerated Ensure   Ultram  [tramadol ] Anaphylaxis, Swelling, Rash   Codeine Nausea And Vomiting, Rash   Desyrel  [trazodone ] Other (See Comments)   Paranoia    Apresoline  [hydralazine ] Other (See Comments)   Muscle spasms   Peanut (diagnostic) Hives, Other (See Comments)   Raw peanuts only  OK to eat peanut butter.   Topamax  [topiramate ] Other (See Comments)   Paranoia   Sulfa Antibiotics Itching   Tape Rash        Medication List     STOP taking these medications    ibuprofen  200 MG tablet Commonly known as: ADVIL        TAKE these medications    albuterol  108 (90 Base) MCG/ACT inhaler Commonly known as: VENTOLIN  HFA Inhale 2 puffs into the lungs every 6 (six) hours as needed for wheezing or shortness of breath.   amLODipine  10 MG tablet Commonly known as: NORVASC  Take 0.5 tablets (5 mg total) by mouth 2 (two) times daily.   atorvastatin  20 MG tablet Commonly known as: LIPITOR  Take 1 tablet (20 mg total) by mouth daily.   calcitRIOL  0.5 MCG capsule Commonly known as: ROCALTROL  Take 1 capsule (0.5 mcg total) by mouth daily.   carvedilol  12.5 MG tablet Commonly  known as: COREG  Take 1 tablet (12.5 mg total) by mouth 2 (two) times daily.   Centrum Silver  50+Women Tabs Take 1 tablet by mouth at bedtime.   cyclobenzaprine  10 MG tablet Commonly known as: FLEXERIL  Take 1 tablet (10 mg total) by mouth 3 (three) times daily.   dicyclomine  10 MG capsule Commonly known as: BENTYL  Take 1 capsule (10 mg total) by mouth 3 (three) times daily.   DULoxetine  30 MG capsule Commonly known as: CYMBALTA  Take 1 capsule (30 mg total) by mouth daily.   epoetin  alfa 10000 UNIT/ML injection Commonly known as: EPOGEN  Inject 1 mL (10,000 Units total) into the vein every Monday, Wednesday, and Friday with hemodialysis.   escitalopram  10 MG tablet Commonly known as: LEXAPRO  Take 1 tablet (10 mg total) by mouth daily.   ezetimibe  10 MG tablet Commonly known as: ZETIA  Take 1 tablet (10 mg total) by mouth daily. Please call office to schedule an appt for further refills. Thank you   ferrous sulfate  325 (65 FE) MG tablet Take 1 tablet (325 mg total) by mouth  daily.   folic acid  1 MG tablet Commonly known as: FOLVITE  Take 1 tablet (1 mg total) by mouth daily.   gabapentin  100 MG capsule Commonly known as: NEURONTIN  Take 1 capsule (100 mg total) by mouth at bedtime.   hydrOXYzine  10 MG tablet Commonly known as: ATARAX  Take 1 tablet (10 mg total) by mouth 3 (three) times daily as needed for anxiety. What changed: when to take this   lamoTRIgine  100 MG tablet Commonly known as: LAMICTAL  Take 1 tablet (100 mg total) by mouth at bedtime.   lidocaine  5 % Commonly known as: LIDODERM  Place 1 patch onto the skin daily. Remove & Discard patch within 12 hours or as directed by MD What changed:  when to take this reasons to take this additional instructions   loperamide  2 MG capsule Commonly known as: IMODIUM  Take 1 capsule (2 mg total) by mouth 2 (two) times daily. What changed: when to take this   MELATONIN PO Take 1 tablet by mouth at bedtime.    metolazone  2.5 MG tablet Commonly known as: ZAROXOLYN  Take 1 tablet (2.5 mg total) by mouth every Monday, Wednesday, and Friday.   mirtazapine  7.5 MG tablet Commonly known as: REMERON  Take 2 tablets (15 mg total) by mouth at bedtime.   omeprazole  40 MG capsule Commonly known as: PRILOSEC Take 1 capsule (40 mg total) by mouth every morning.   prazosin  2 MG capsule Commonly known as: MINIPRESS  Take 1 capsule (2 mg total) by mouth at bedtime.   QUEtiapine  50 MG tablet Commonly known as: SEROQUEL  Take 1 tablet (50 mg total) by mouth 2 (two) times daily.   Saphris  10 MG Subl Generic drug: Asenapine  Maleate Place 10 mg under the tongue at bedtime.   torsemide  20 MG tablet Commonly known as: DEMADEX  Take 1 tablet (20 mg total) by mouth 2 (two) times daily. What changed:  how much to take how to take this when to take this   Trelegy Ellipta  100-62.5-25 MCG/ACT Aepb Generic drug: Fluticasone -Umeclidin-Vilant Inhale 1 puff into the lungs daily.        Follow-up Information     Ilah Crigler, MD Follow up in 5 day(s).   Specialty: Family Medicine Why: Hospital follow up Contact information: [address] [place] [address] [phone]                Discharge Exam: Fredricka Weights   07/11/23 1442 07/11/23 1830 07/11/23 1845  Weight: 93.8 kg 92.9 kg 92.9 kg   Vitals:   07/12/23 0810 07/12/23 0819  BP:  115/64  Pulse: 77 76  Resp: 16 16  Temp:  98 F (36.7 C)  SpO2: 98% 99%   Examination: Physical Exam:  Constitutional: Thin African-American female no acute distress Respiratory: Diminished to auscultation bilaterally, no wheezing, rales, rhonchi or crackles. Normal respiratory effort and patient is not tachypenic. No accessory muscle use.  Unlabored breathing Cardiovascular: RRR, no murmurs / rubs / gallops. S1 and S2 auscultated. No extremity edema.  Abdomen: Soft, non-tender, non-distended. Bowel sounds positive.  GU:  Deferred. Musculoskeletal: No clubbing / cyanosis of digits/nails. No joint deformity upper and lower extremities.  Skin: No rashes, lesions, ulcers on the skin evaluation. No induration; Warm and dry.  Neurologic: CN 2-12 grossly intact with no focal deficits. Romberg sign and cerebellar reflexes not assessed.  Psychiatric: [name] awake and alert  Condition at discharge: stable  The results of significant diagnostics from this hospitalization (including imaging, microbiology, ancillary and laboratory) are listed below  for reference.   Imaging Studies: DG Chest 2 View Result Date: 07/12/2023 CLINICAL DATA:  Shortness of breath EXAM: CHEST - 2 VIEW COMPARISON:  Chest radiograph dated 06/27/2023 FINDINGS: Lines/tubes: Right internal jugular venous catheter tip projects over the superior cavoatrial junction. Lungs: Unchanged asymmetric elevation of the right hemidiaphragm. Chronic-appearing bilateral perihilar pulmonary vasculature. No focal consolidation. Pleura: No pneumothorax or pleural effusion. Heart/mediastinum: Similar enlarged cardiomediastinal silhouette. Bones: No acute osseous abnormality. IMPRESSION: 1. Chronic-appearing bilateral perihilar pulmonary vasculature. No focal consolidation. 2. Similar cardiomegaly. Electronically Signed   By: Limin  Xu M.D.   On: 07/12/2023 14:33   DG Chest Port 1 View Result Date: 06/27/2023 CLINICAL DATA:  Chest pain and shortness of breath. EXAM: PORTABLE CHEST 1 VIEW COMPARISON:  June 21, 2023. FINDINGS: Stable cardiomegaly. Right internal jugular dialysis catheter is unchanged. Both lungs are clear. Old left rib fractures. IMPRESSION: No active disease. Electronically Signed   By: Lynwood Landy Raddle M.D.   On: 06/27/2023 15:30   DG Chest 2 View Result Date: 06/21/2023 CLINICAL DATA:  Lower extremity edema. EXAM: CHEST - 2 VIEW COMPARISON:  Jun 15, 2023. FINDINGS: Stable cardiomediastinal silhouette. Right internal jugular dialysis catheter is unchanged. Old  left rib fractures. No acute pulmonary disease. IMPRESSION: No active cardiopulmonary disease. Electronically Signed   By: Lynwood Landy Raddle M.D.   On: 06/21/2023 15:08   Microbiology: Results for orders placed or performed during the hospital encounter of 06/27/23  Blood Culture (routine x 2)     Status: None   Collection Time: 06/27/23  3:52 PM   Specimen: BLOOD RIGHT HAND  Result Value Ref Range Status   Specimen Description BLOOD RIGHT HAND  Final   Special Requests   Final    BOTTLES DRAWN AEROBIC ONLY Blood Culture results may not be optimal due to an inadequate volume of blood received in culture bottles   Culture   Final    NO GROWTH 5 DAYS Performed at [facility], [address], [place], [place] [address]    Report Status 07/02/2023 FINAL  Final  Blood Culture (routine x 2)     Status: None   Collection Time: 06/27/23  4:02 PM   Specimen: BLOOD RIGHT ARM  Result Value Ref Range Status   Specimen Description BLOOD RIGHT ARM  Final   Special Requests   Final    BOTTLES DRAWN AEROBIC ONLY Blood Culture results may not be optimal due to an inadequate volume of blood received in culture bottles   Culture   Final    NO GROWTH 5 DAYS Performed at [facility], [address] [place], [place] [address]    Report Status 07/02/2023 FINAL  Final  Resp panel by RT-PCR (RSV, Flu A&B, Covid) Anterior Nasal Swab     Status: None   Collection Time: 06/27/23  4:11 PM   Specimen: Anterior Nasal Swab  Result Value Ref Range Status   SARS Coronavirus 2 by RT PCR NEGATIVE NEGATIVE Final   Influenza A by PCR NEGATIVE NEGATIVE Final   Influenza B by PCR NEGATIVE NEGATIVE Final    Comment: (NOTE) The Xpert Xpress SARS-CoV-2/FLU/RSV plus assay is intended as an aid in the diagnosis of influenza from Nasopharyngeal swab specimens and should not be used as a sole basis for treatment. Nasal washings and aspirates are unacceptable for Xpert Xpress SARS-CoV-2/FLU/RSV testing.  Fact  Sheet for Patients: BloggerCourse.com  Fact Sheet for Healthcare Providers: SeriousBroker.it  This test is not yet approved  or cleared by the [place]  FDA and has been authorized for detection and/or diagnosis of SARS-CoV-2 by FDA under an Emergency Use Authorization (EUA). This EUA will remain in effect (meaning this test can be used) for the duration of the COVID-19 declaration under Section 564(b)(1) of the Act, 21 U.S.C. section 360bbb-3(b)(1), unless the authorization is terminated or revoked.     Resp Syncytial Virus by PCR NEGATIVE NEGATIVE Final    Comment: (NOTE) Fact Sheet for Patients: BloggerCourse.com  Fact Sheet for Healthcare Providers: SeriousBroker.it  This test is not yet approved or cleared by the [place]  FDA and has been authorized for detection and/or diagnosis of SARS-CoV-2 by FDA under an Emergency Use Authorization (EUA). This EUA will remain in effect (meaning this test can be used) for the duration of the COVID-19 declaration under Section 564(b)(1) of the Act, 21 U.S.C. section 360bbb-3(b)(1), unless the authorization is terminated or revoked.  Performed at [facility], [address] [place], [place] [address]   Urine Culture     Status: Abnormal   Collection Time: 06/27/23  5:51 PM   Specimen: Urine, Random  Result Value Ref Range Status   Specimen Description URINE, RANDOM  Final   Special Requests   Final    NONE Reflexed from [id number] Performed at [facility], [address], [place], [place] [address]    Culture >=100,000 COLONIES/mL ESCHERICHIA COLI (A)  Final   Report Status 06/29/2023 FINAL  Final   Organism ID, Bacteria ESCHERICHIA COLI (A)  Final      Susceptibility   Escherichia coli - MIC*    AMPICILLIN >=32 RESISTANT Resistant     CEFAZOLIN  <=4 SENSITIVE Sensitive     CEFEPIME  <=0.12 SENSITIVE Sensitive      CEFTRIAXONE  <=0.25 SENSITIVE Sensitive     CIPROFLOXACIN  1 RESISTANT Resistant     GENTAMICIN <=1 SENSITIVE Sensitive     IMIPENEM <=0.25 SENSITIVE Sensitive     NITROFURANTOIN  <=16 SENSITIVE Sensitive     TRIMETH/SULFA >=320 RESISTANT Resistant     AMPICILLIN/SULBACTAM 16 INTERMEDIATE Intermediate     PIP/TAZO <=4 SENSITIVE Sensitive ug/mL    * >=100,000 COLONIES/mL ESCHERICHIA COLI  Wet prep, genital     Status: None   Collection Time: 07/06/23  3:00 PM   Specimen: Vaginal  Result Value Ref Range Status   Yeast Wet Prep HPF POC NONE SEEN NONE SEEN Final   Trich, Wet Prep NONE SEEN NONE SEEN Final   Clue Cells Wet Prep HPF POC NONE SEEN NONE SEEN Final   WBC, Wet Prep HPF POC <10 <10 Final   Sperm NONE SEEN  Final    Comment: Performed at [facility], [address], [place], [place] [address]   *Note: Due to a large number of results and/or encounters for the requested time period, some results have not been displayed. A complete set of results can be found in Results Review.   Labs: CBC: Recent Labs  Lab 07/10/23 0717 07/11/23 1017 07/12/23 0425 07/14/23 0631  WBC 9.7 6.2 7.8 7.4  NEUTROABS 7.0 4.8 5.8 4.9  HGB 8.0* 8.4* 7.3* 6.9*  HCT 25.4* 26.4* 23.2* 22.3*  MCV 93.7 92.3 93.5 95.3  PLT 305 286 245 258   Basic Metabolic Panel: Recent Labs  Lab 07/10/23 0717 07/11/23 1017 07/12/23 0425 07/14/23 0631  NA 122* 128* 129* 128*  K 3.1* 3.5 3.3* 3.6  CL 85* 92* 93* 94*  CO2 21* 26 26 23   GLUCOSE  93 98 96 88  BUN 49* 29* 26* 55*  CREATININE 5.01* 3.29* 2.53* 4.39*  CALCIUM  8.9 8.6* 8.4* 9.0  MG  --  1.8 1.7  --   PHOS  --  3.9 2.9  --    Liver Function Tests: Recent Labs  Lab 07/11/23 1017 07/12/23 0425 07/14/23 0631  AST 16 14* 14*  ALT 6 9 8   ALKPHOS 85 74 71  BILITOT 0.3 0.2 0.4  PROT 6.5 5.6* 5.9*  ALBUMIN  3.2* 2.8* 3.2*   CBG: No results for input(s): GLUCAP in the last 168 hours.  Discharge time spent: greater than 30  minutes.  Signed: Alejandro Marker, DO [facility] 07/15/2023

## 2023-07-22 IMAGING — CT CT ABD-PELV W/O CM
2 of 4 series · 16 of 46 positions shown, 18 images · non-contrast
Comparison: July 23, 2020.

CLINICAL DATA: Flank pain, kidney stone suspected Timmy Robleto, AMS

EXAM:
CT ABDOMEN AND PELVIS WITHOUT CONTRAST
TECHNIQUE: Multidetector CT imaging of the abdomen and pelvis was performed
following the standard protocol without IV contrast.

[Series 2: axial st · axial · 0.83mm/px · z∈[+542,+937]mm · 13 of 89 slices shown, 15 images]
[im 5/89  soft-tissue]
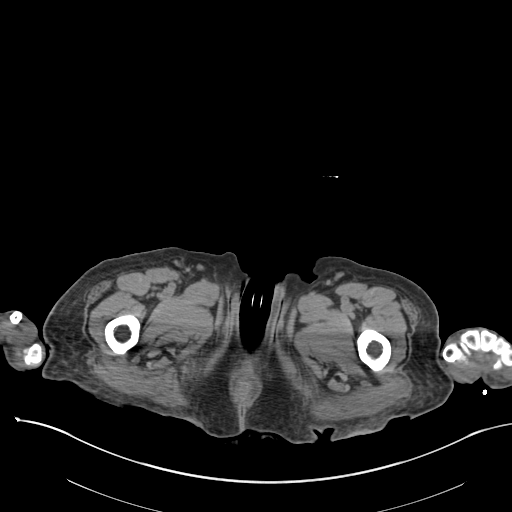
[im 5/89  bone]
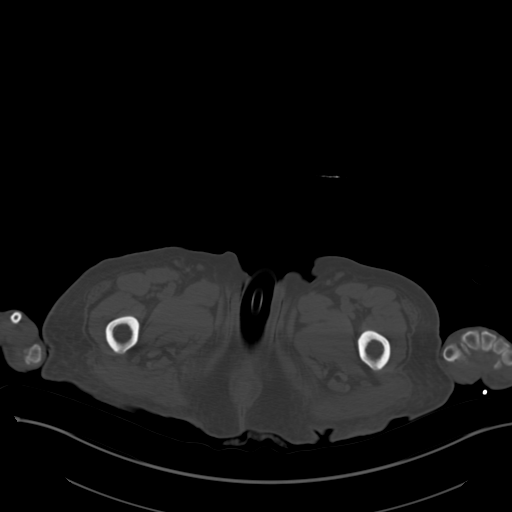
[im 14/89  soft-tissue]
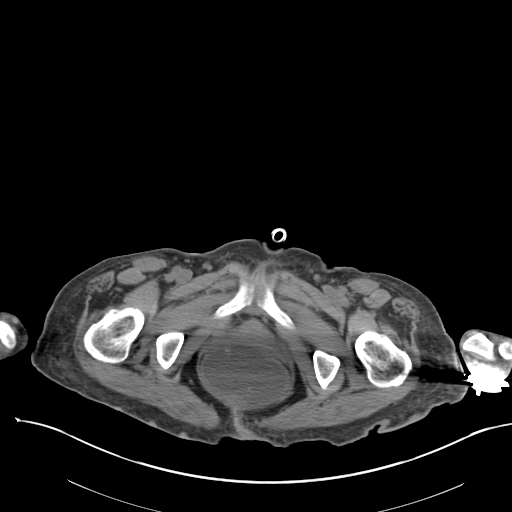
[im 18/89  soft-tissue]
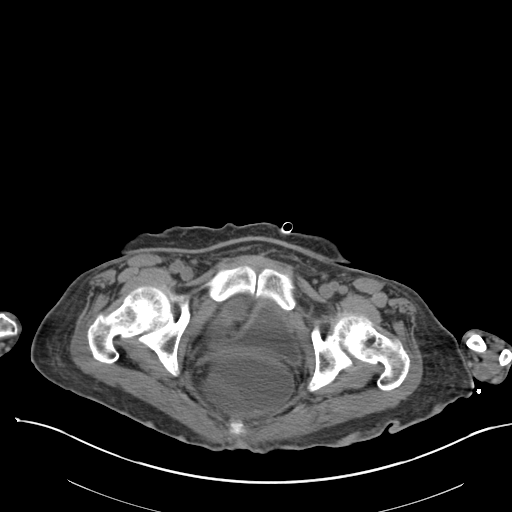
[im 27/89  soft-tissue]
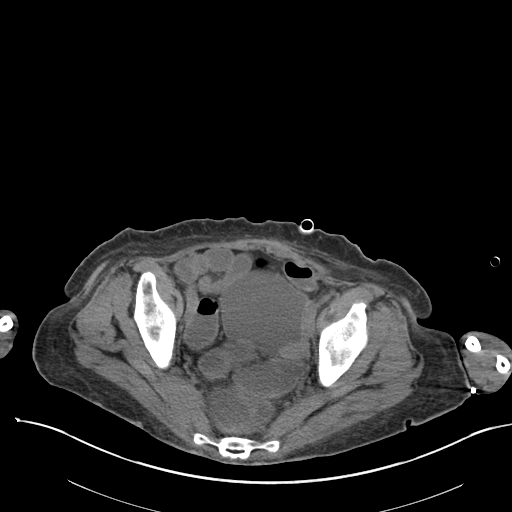
[im 31/89  soft-tissue]
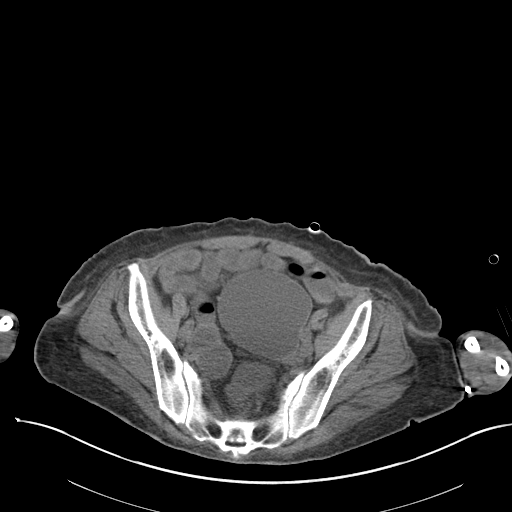
[im 40/89  soft-tissue]
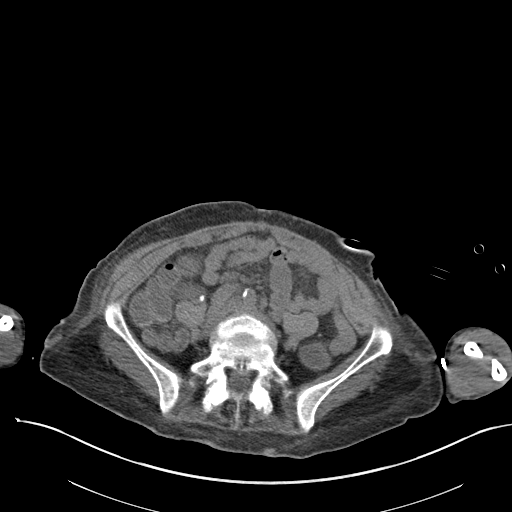
[im 45/89  soft-tissue]
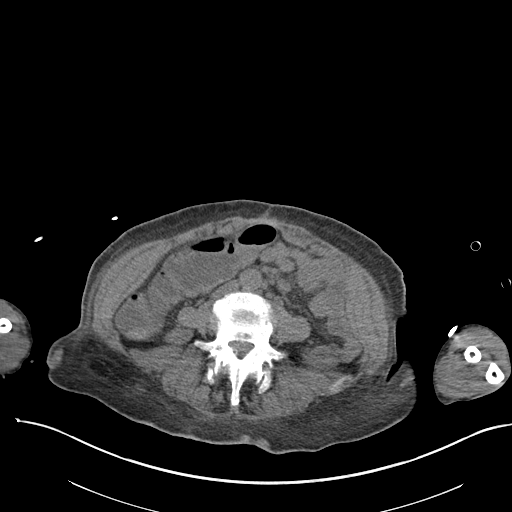
[im 49/89  soft-tissue]
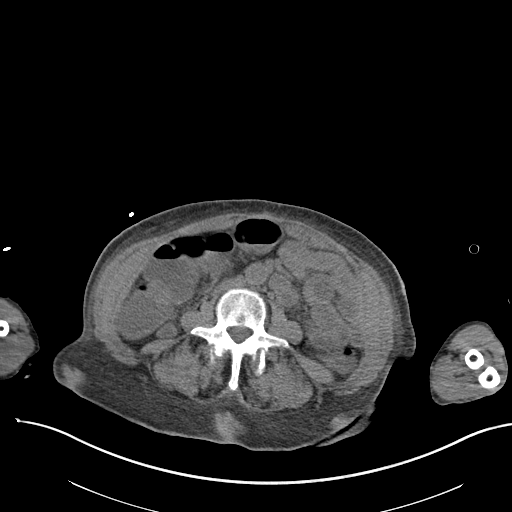
[im 58/89  soft-tissue]
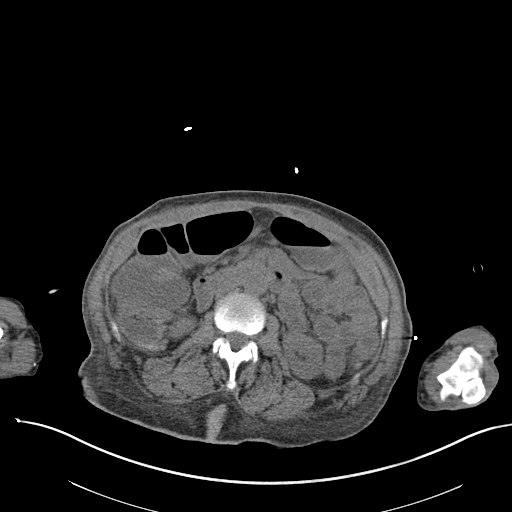
[im 58/89  bone]
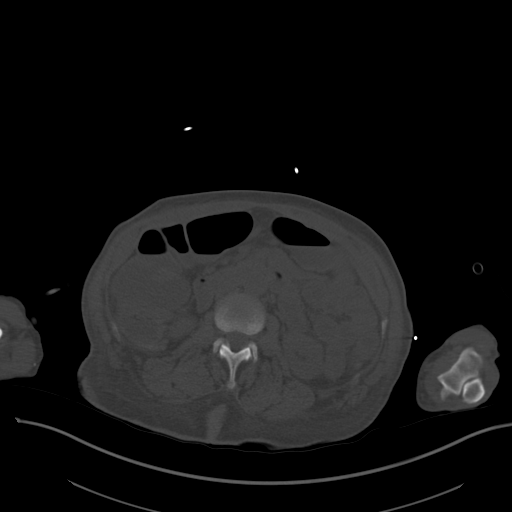
[im 62/89  soft-tissue]
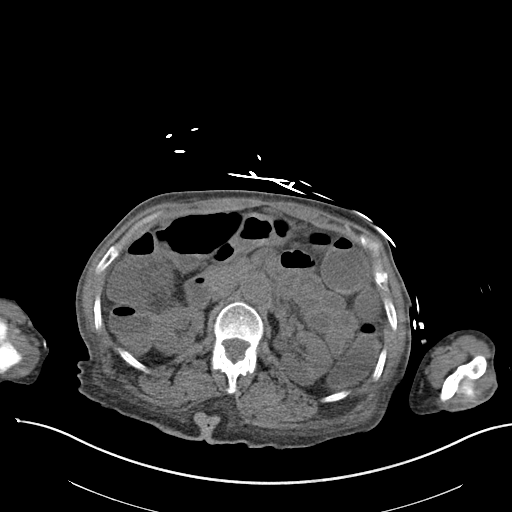
[im 71/89  soft-tissue]
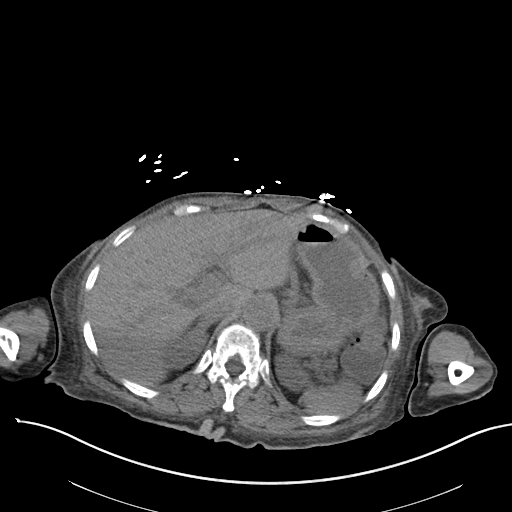
[im 75/89  soft-tissue]
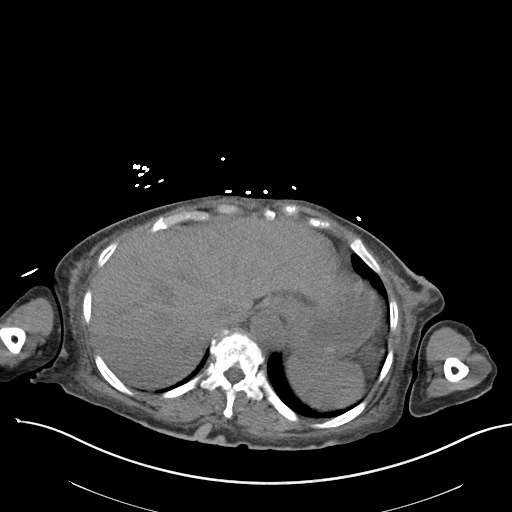
[im 84/89  soft-tissue]
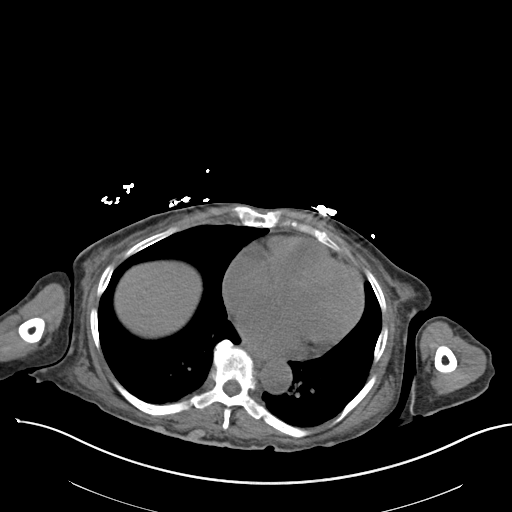

[Series 5: coronal st · coronal · 0.73mm/px · 3 of 150 slices shown]
[im 50/150  soft-tissue]
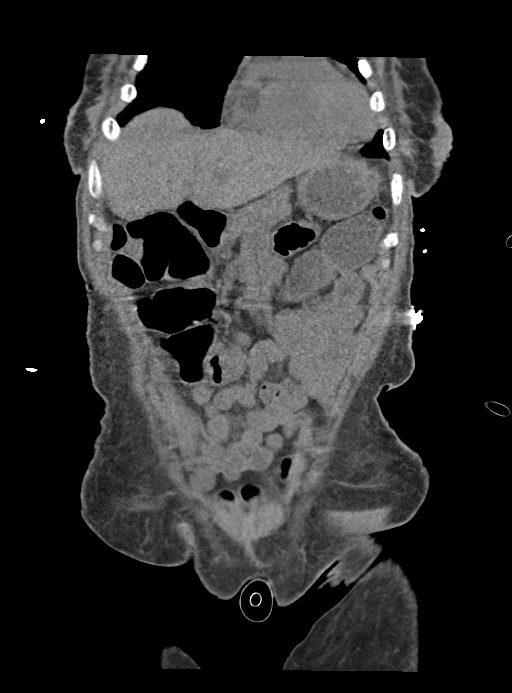
[im 67/150  soft-tissue]
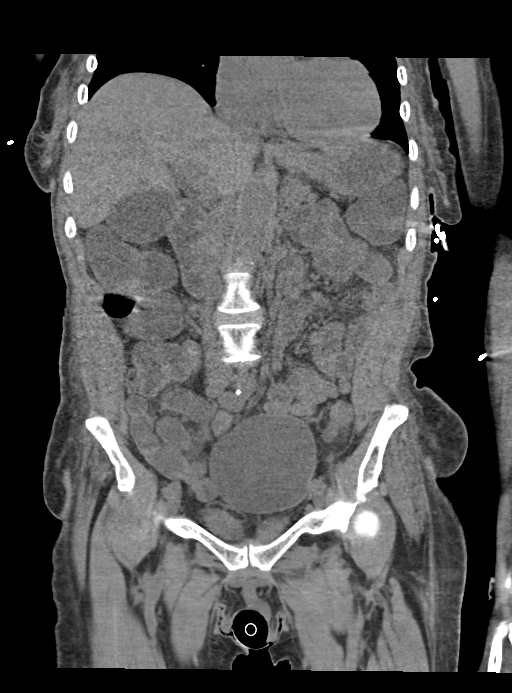
[im 83/150  soft-tissue]
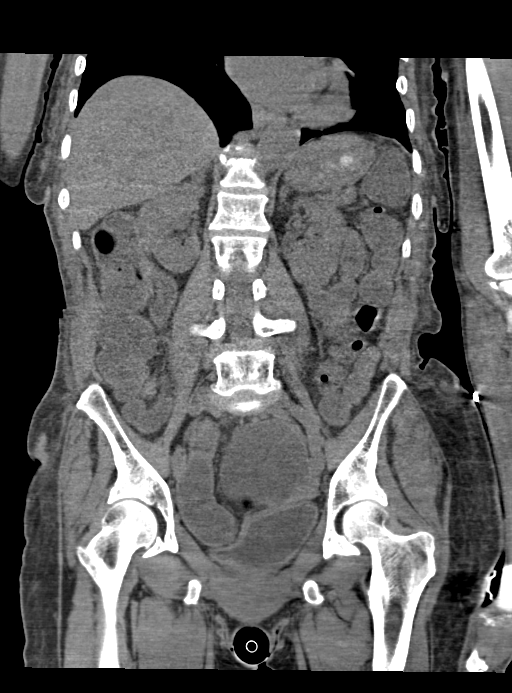

[16 of 46 positions shown; findings below may reference images not displayed]

FINDINGS: Evaluation is limited by lack of IV contrast.

Lower chest: No acute abnormality.

Hepatobiliary: Cholelithiasis within the mildly distended
gallbladder, similar in comparison to prior. No pericholecystic fat
stranding. Unremarkable noncontrast appearance of the liver.

Pancreas: No peripancreatic fat stranding.

Spleen: Unremarkable.

Adrenals/Urinary Tract: Unchanged low-density LEFT adrenal nodule,
likely a adrenal adenoma. RIGHT adrenal gland is unremarkable. No
hydronephrosis. No nephrolithiasis. Bladder is decompressed,
limiting evaluation.

Stomach/Bowel: Colon is distended with fluid. Appendiceal candidate
is normal; there is no ancillary evidence of appendicitis. No
evidence of bowel obstruction. No pneumatosis or portal venous gas.

Vascular/Lymphatic: Minimal atherosclerotic calcifications of the
aorta. No suspicious lymphadenopathy visualized.

Reproductive: Unchanged 8.4 cm LEFT adnexal cyst. Status post
hysterectomy.

Other: No free air.  Status post periumbilical hernia repair.

Musculoskeletal: Remote LEFT-sided rib fractures. No acute osseous
abnormality.
IMPRESSION: 1. The colon is nondilated but is distended with fluid. This could
reflect a nonspecific colitis. No definitive noncontrast evidence of
bowel ischemia.
2. Stable appearance of a LEFT adnexal cyst for which a follow-up
ultrasound in 6-12 months is recommended.
3. Similar appearance of cholelithiasis without evidence of
cholecystitis.

## 2023-07-22 IMAGING — CR DG CHEST 1V PORT
1 series · 1 of 1 positions shown · non-contrast
Comparison: February 18, 2020.

CLINICAL DATA: Sepsis.

EXAM:
PORTABLE CHEST 1 VIEW

[x chest ap]
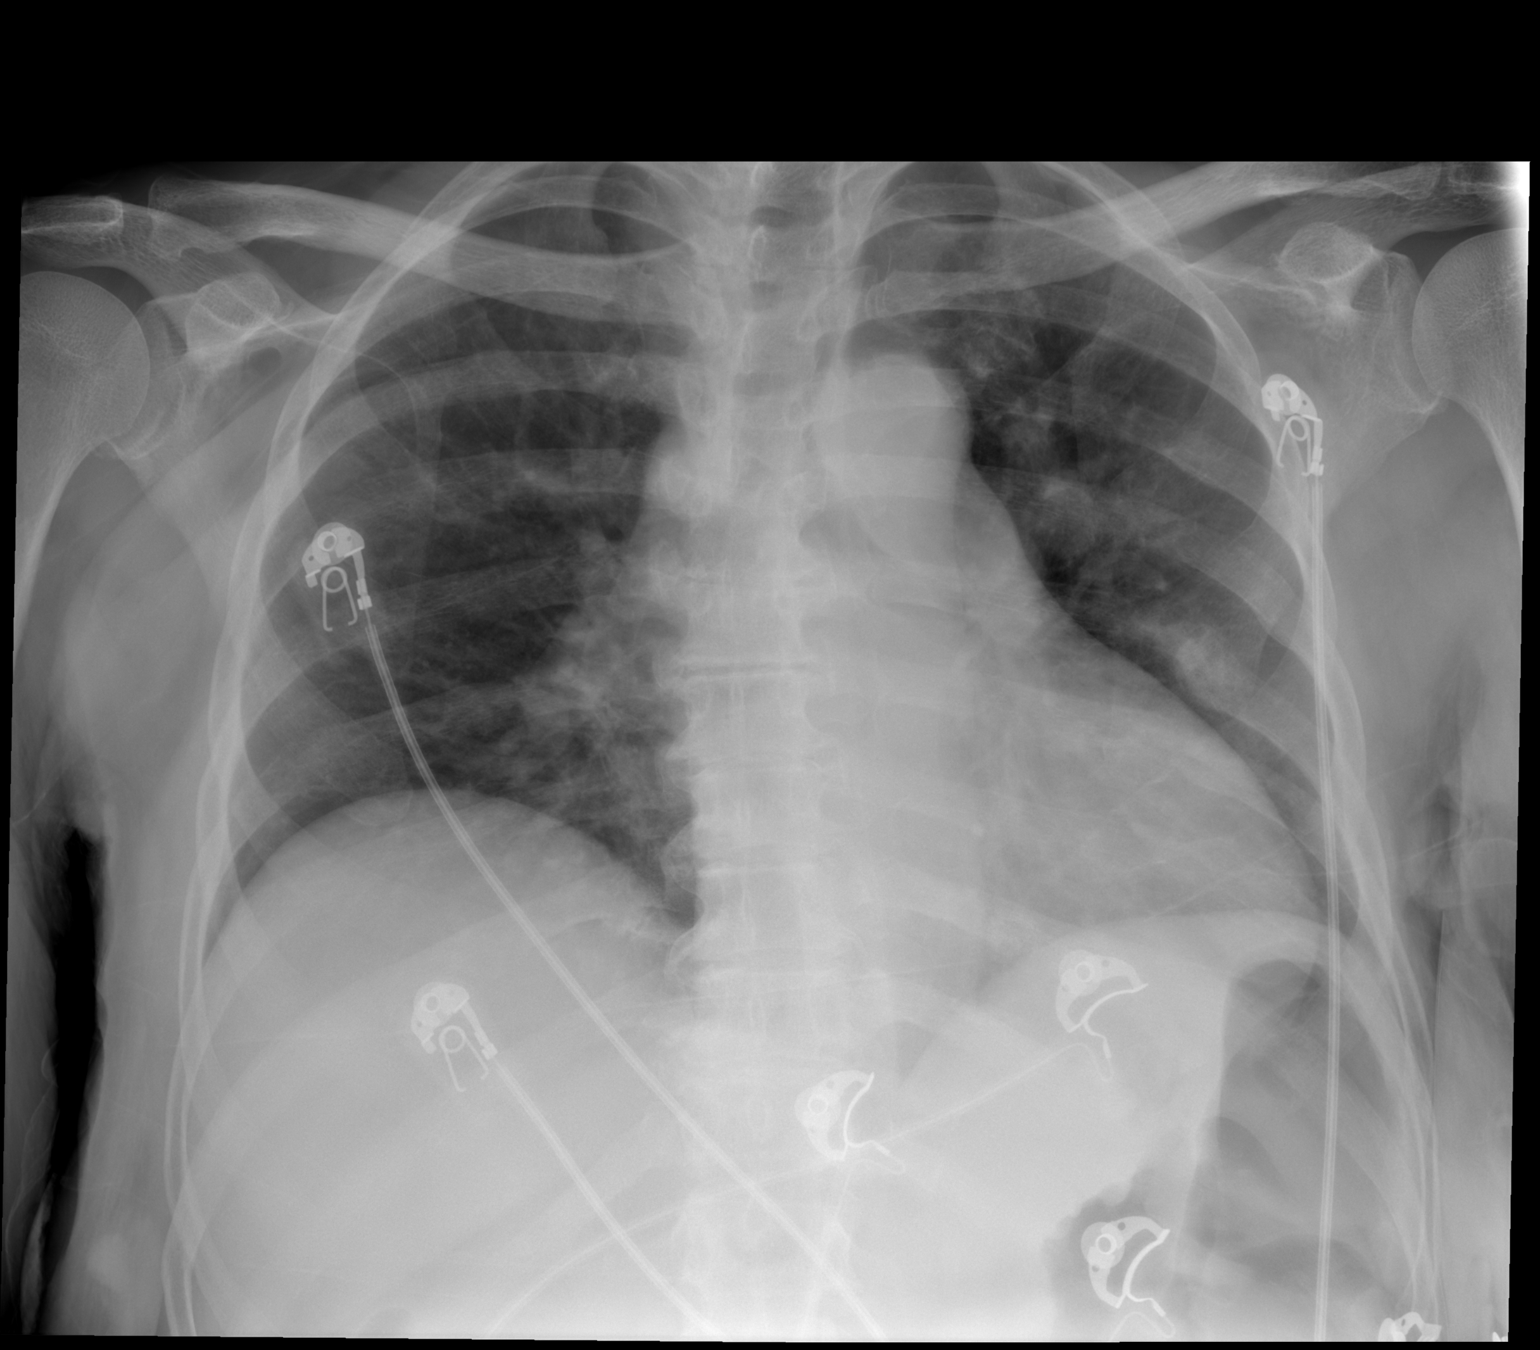

[1 of 1 positions shown; findings below may reference images not displayed]

FINDINGS: Stable cardiomediastinal silhouette. Both lungs are clear. The
visualized skeletal structures are unremarkable.
IMPRESSION: No active disease.

## 2023-07-22 IMAGING — CT CT HEAD W/O CM
3 series · 14 of 47 positions shown, 16 images · non-contrast
Comparison: 07/22/2020

CLINICAL DATA: RTOYOTA JOSHJAX from home, family reports that patient has
been weak and lethargic since hospital discharge, over 5 days they
have noticed bright red blood in stool. W/ EMS pt had pressure of
100/50 and then 90/40, then 80/40, pt refused EMS staring an IV.
When EMS arrived pt had her BP pills in her hand, told EMS she would
eat them, EMS removed her meds. Hx of psych issues.

EXAM:
CT HEAD WITHOUT CONTRAST
TECHNIQUE: Contiguous axial images were obtained from the base of the skull
through the vertex without intravenous contrast.

[Series 2: head wo · axial · 0.47mm/px · z∈[+220,+350]mm · 8 of 32 slices shown, 10 images]
[im 3/32  brain]
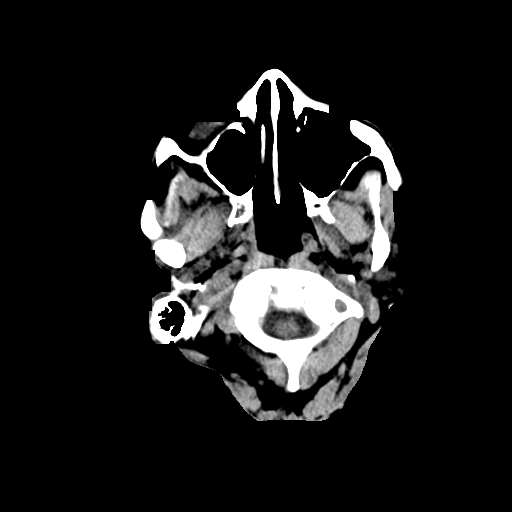
[im 3/32  bone]
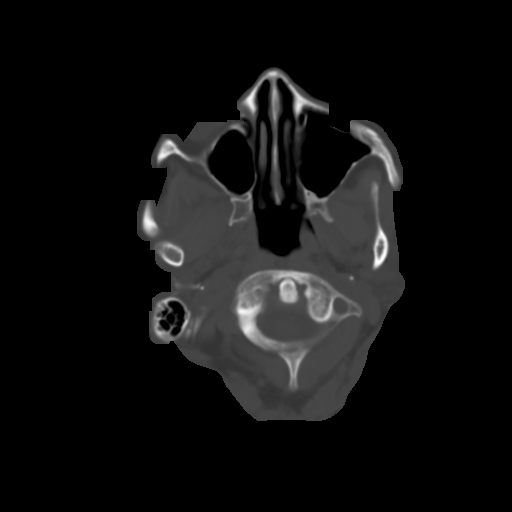
[im 7/32  brain]
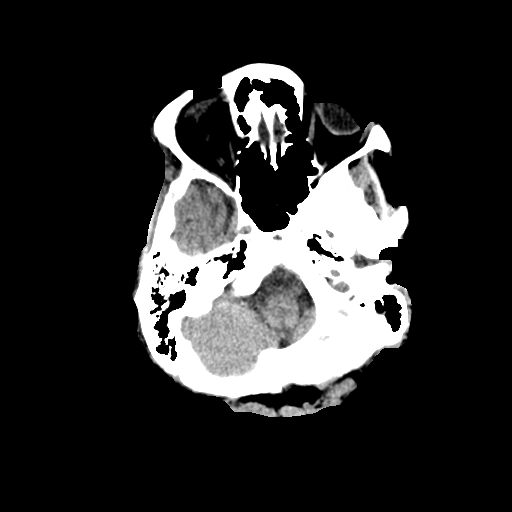
[im 10/32  brain]
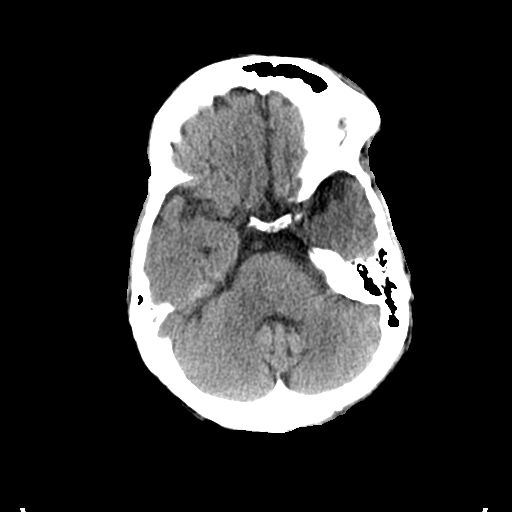
[im 14/32  brain]
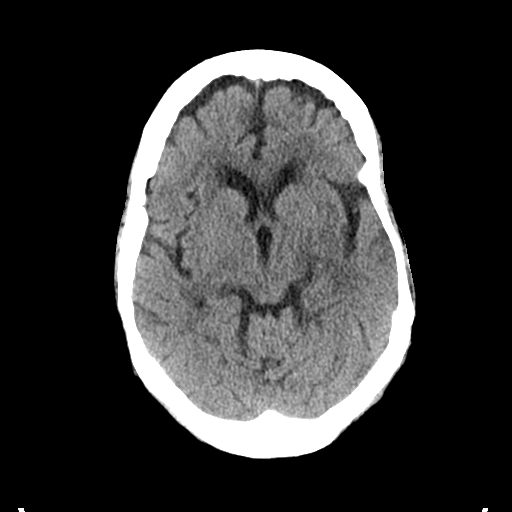
[im 18/32  brain]
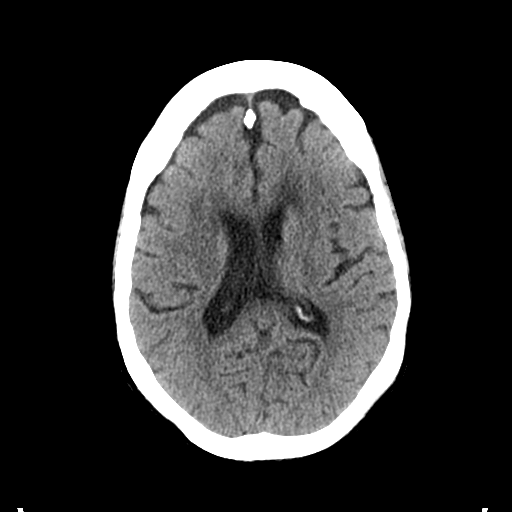
[im 18/32  bone]
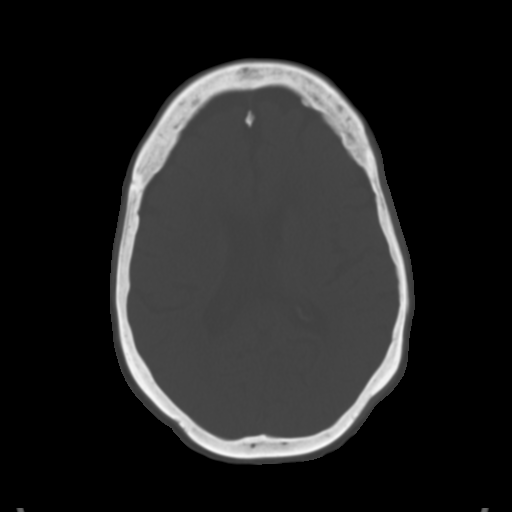
[im 22/32  brain]
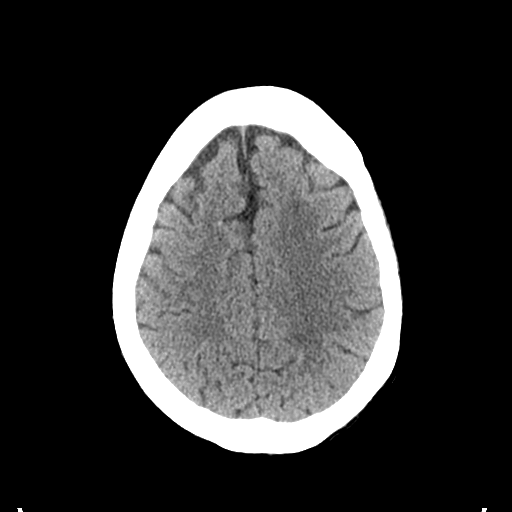
[im 25/32  brain]
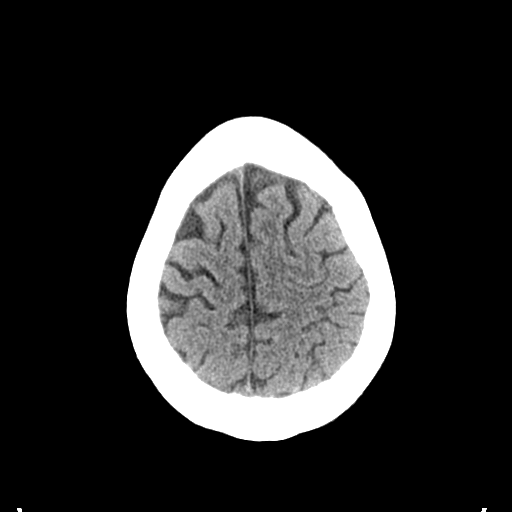
[im 29/32  brain]
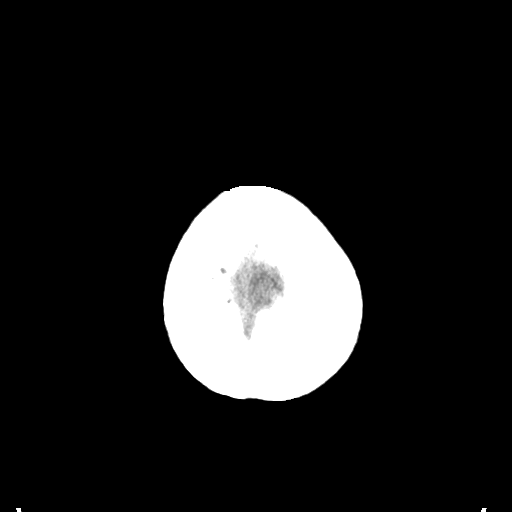

[Series 5: coronal soft tissue · coronal · 0.36mm/px · 3 of 74 slices shown]
[im 25/74  brain]
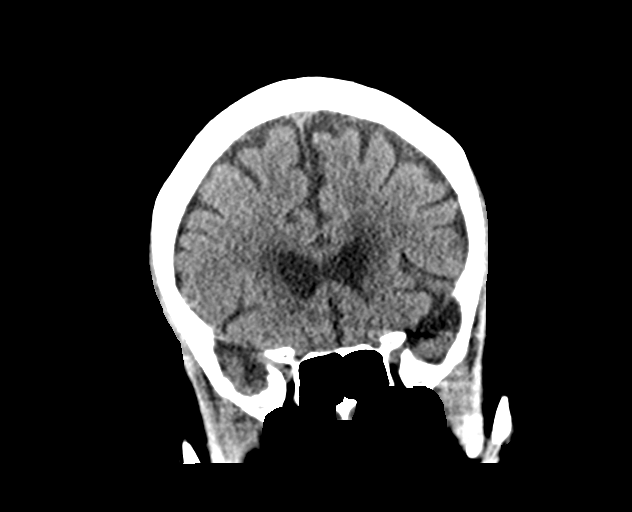
[im 33/74  brain]
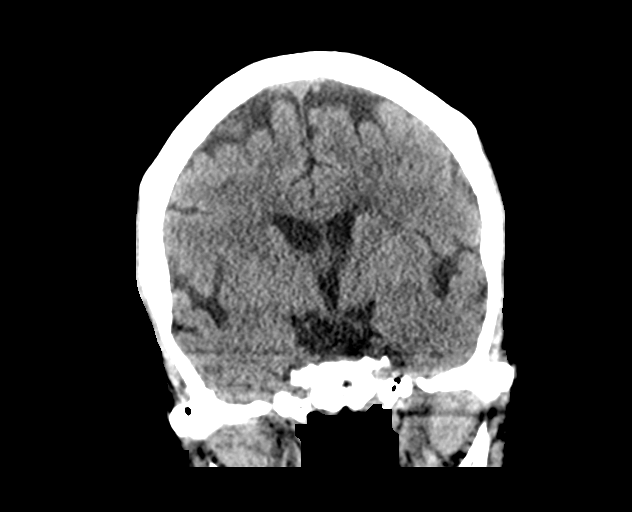
[im 41/74  brain]
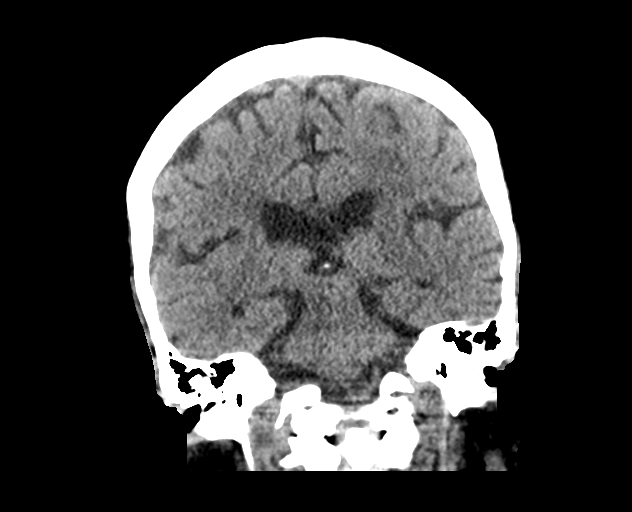

[Series 6: sagittal soft tissue · sagittal · 0.32mm/px · 3 of 52 slices shown]
[im 18/52  brain]
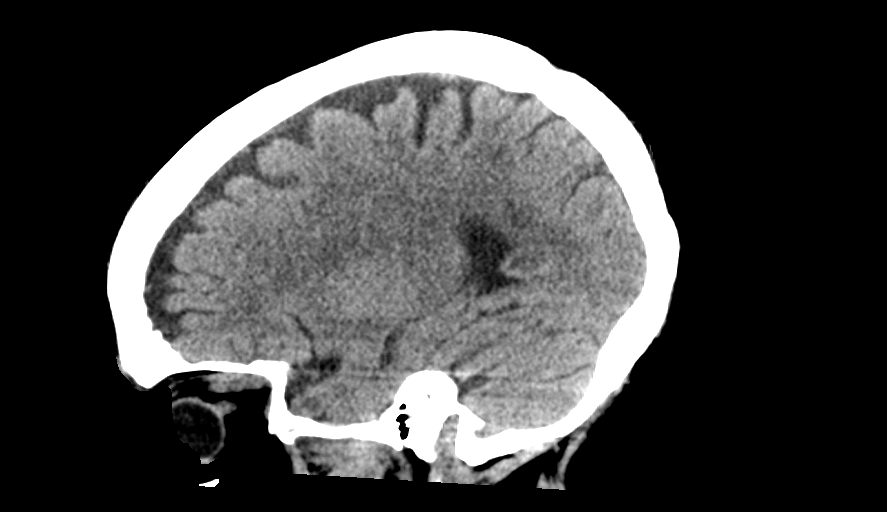
[im 26/52  brain]
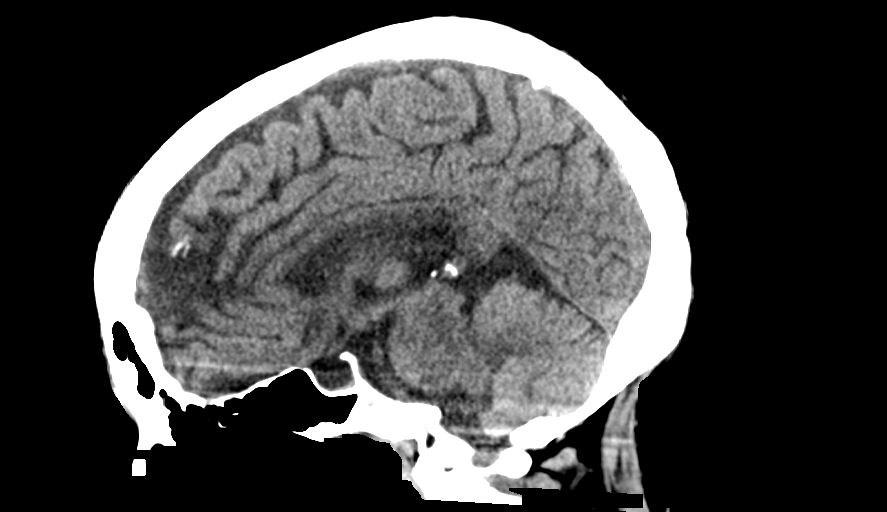
[im 35/52  brain]
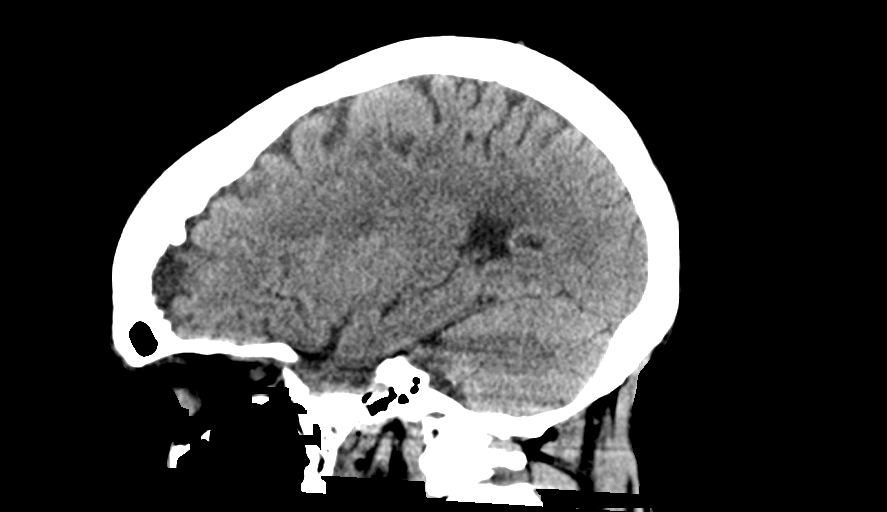

[14 of 47 positions shown; findings below may reference images not displayed]

FINDINGS: Brain: No evidence of acute infarction, hemorrhage, hydrocephalus,
extra-axial collection or mass lesion/mass effect.

Patchy periventricular white matter hypoattenuation is noted
consistent with mild chronic microvascular ischemic change, stable.

Vascular: No hyperdense vessel or unexpected calcification.

Skull: Normal. Negative for fracture or focal lesion.

Sinuses/Orbits: Globes and orbits are unremarkable. Visualized
sinuses are clear.

Other: None.
IMPRESSION: 1. No acute intracranial abnormalities. No change from the prior
study.

## 2023-07-23 ENCOUNTER — Other Ambulatory Visit (HOSPITAL_COMMUNITY): Payer: Self-pay

## 2023-07-27 ENCOUNTER — Emergency Department (HOSPITAL_COMMUNITY)

## 2023-07-27 ENCOUNTER — Other Ambulatory Visit: Payer: Self-pay

## 2023-07-27 ENCOUNTER — Inpatient Hospital Stay (HOSPITAL_COMMUNITY)
Admission: EM | Admit: 2023-07-27 | Discharge: 2023-08-05 | DRG: 314 | Disposition: A | Discharge status: Home / Self Care | Attending: Student | Admitting: Student

## 2023-07-27 ENCOUNTER — Encounter (HOSPITAL_COMMUNITY): Payer: Self-pay

## 2023-07-27 DIAGNOSIS — G928 Other toxic encephalopathy: Secondary | ICD-10-CM | POA: Diagnosis present

## 2023-07-27 DIAGNOSIS — Z91048 Other nonmedicinal substance allergy status: Secondary | ICD-10-CM

## 2023-07-27 DIAGNOSIS — Z888 Allergy status to other drugs, medicaments and biological substances status: Secondary | ICD-10-CM

## 2023-07-27 DIAGNOSIS — N186 End stage renal disease: Secondary | ICD-10-CM | POA: Diagnosis present

## 2023-07-27 DIAGNOSIS — Z882 Allergy status to sulfonamides status: Secondary | ICD-10-CM

## 2023-07-27 DIAGNOSIS — B958 Unspecified staphylococcus as the cause of diseases classified elsewhere: Secondary | ICD-10-CM | POA: Diagnosis present

## 2023-07-27 DIAGNOSIS — E1122 Type 2 diabetes mellitus with diabetic chronic kidney disease: Secondary | ICD-10-CM | POA: Diagnosis present

## 2023-07-27 DIAGNOSIS — Z87891 Personal history of nicotine dependence: Secondary | ICD-10-CM

## 2023-07-27 DIAGNOSIS — I5032 Chronic diastolic (congestive) heart failure: Secondary | ICD-10-CM | POA: Diagnosis present

## 2023-07-27 DIAGNOSIS — G4733 Obstructive sleep apnea (adult) (pediatric): Secondary | ICD-10-CM | POA: Diagnosis present

## 2023-07-27 DIAGNOSIS — F4312 Post-traumatic stress disorder, chronic: Secondary | ICD-10-CM | POA: Diagnosis present

## 2023-07-27 DIAGNOSIS — G894 Chronic pain syndrome: Secondary | ICD-10-CM | POA: Diagnosis present

## 2023-07-27 DIAGNOSIS — F200 Paranoid schizophrenia: Secondary | ICD-10-CM | POA: Diagnosis present

## 2023-07-27 DIAGNOSIS — I959 Hypotension, unspecified: Principal | ICD-10-CM | POA: Diagnosis present

## 2023-07-27 DIAGNOSIS — E785 Hyperlipidemia, unspecified: Secondary | ICD-10-CM | POA: Diagnosis present

## 2023-07-27 DIAGNOSIS — N390 Urinary tract infection, site not specified: Secondary | ICD-10-CM | POA: Diagnosis present

## 2023-07-27 DIAGNOSIS — Z885 Allergy status to narcotic agent status: Secondary | ICD-10-CM

## 2023-07-27 DIAGNOSIS — E876 Hypokalemia: Secondary | ICD-10-CM | POA: Diagnosis present

## 2023-07-27 DIAGNOSIS — J9601 Acute respiratory failure with hypoxia: Secondary | ICD-10-CM | POA: Diagnosis present

## 2023-07-27 DIAGNOSIS — S2239XS Fracture of one rib, unspecified side, sequela: Secondary | ICD-10-CM

## 2023-07-27 DIAGNOSIS — Z7952 Long term (current) use of systemic steroids: Secondary | ICD-10-CM

## 2023-07-27 DIAGNOSIS — Z91158 Patient's noncompliance with renal dialysis for other reason: Secondary | ICD-10-CM

## 2023-07-27 DIAGNOSIS — Z794 Long term (current) use of insulin: Secondary | ICD-10-CM

## 2023-07-27 DIAGNOSIS — Z79899 Other long term (current) drug therapy: Secondary | ICD-10-CM

## 2023-07-27 DIAGNOSIS — T40605A Adverse effect of unspecified narcotics, initial encounter: Secondary | ICD-10-CM | POA: Diagnosis present

## 2023-07-27 DIAGNOSIS — Z91419 Personal history of unspecified adult abuse: Secondary | ICD-10-CM

## 2023-07-27 DIAGNOSIS — J4489 Other specified chronic obstructive pulmonary disease: Secondary | ICD-10-CM | POA: Diagnosis present

## 2023-07-27 DIAGNOSIS — Z8673 Personal history of transient ischemic attack (TIA), and cerebral infarction without residual deficits: Secondary | ICD-10-CM

## 2023-07-27 DIAGNOSIS — D631 Anemia in chronic kidney disease: Secondary | ICD-10-CM | POA: Diagnosis present

## 2023-07-27 DIAGNOSIS — K219 Gastro-esophageal reflux disease without esophagitis: Secondary | ICD-10-CM | POA: Diagnosis present

## 2023-07-27 DIAGNOSIS — R0789 Other chest pain: Secondary | ICD-10-CM | POA: Diagnosis present

## 2023-07-27 DIAGNOSIS — G40909 Epilepsy, unspecified, not intractable, without status epilepticus: Secondary | ICD-10-CM | POA: Diagnosis present

## 2023-07-27 DIAGNOSIS — J439 Emphysema, unspecified: Secondary | ICD-10-CM | POA: Diagnosis present

## 2023-07-27 DIAGNOSIS — Z886 Allergy status to analgesic agent status: Secondary | ICD-10-CM

## 2023-07-27 DIAGNOSIS — M542 Cervicalgia: Secondary | ICD-10-CM | POA: Diagnosis present

## 2023-07-27 DIAGNOSIS — E1165 Type 2 diabetes mellitus with hyperglycemia: Secondary | ICD-10-CM | POA: Diagnosis not present

## 2023-07-27 DIAGNOSIS — X58XXXS Exposure to other specified factors, sequela: Secondary | ICD-10-CM | POA: Diagnosis present

## 2023-07-27 DIAGNOSIS — I252 Old myocardial infarction: Secondary | ICD-10-CM

## 2023-07-27 DIAGNOSIS — Z992 Dependence on renal dialysis: Secondary | ICD-10-CM

## 2023-07-27 DIAGNOSIS — Z8249 Family history of ischemic heart disease and other diseases of the circulatory system: Secondary | ICD-10-CM

## 2023-07-27 DIAGNOSIS — I132 Hypertensive heart and chronic kidney disease with heart failure and with stage 5 chronic kidney disease, or end stage renal disease: Secondary | ICD-10-CM | POA: Diagnosis present

## 2023-07-27 DIAGNOSIS — Z5320 Procedure and treatment not carried out because of patient's decision for unspecified reasons: Secondary | ICD-10-CM | POA: Diagnosis present

## 2023-07-27 DIAGNOSIS — Z91010 Allergy to peanuts: Secondary | ICD-10-CM

## 2023-07-27 MED ORDER — ACETAMINOPHEN 500 MG PO TABS
1000.0000 mg | ORAL_TABLET | Freq: Once | ORAL | Status: AC
  Administered 2023-07-27: 1000 mg via ORAL
  Filled 2023-07-27: qty 2

## 2023-07-27 NOTE — ED Notes (Signed)
 Patient returned from X-ray

## 2023-07-27 NOTE — ED Provider Notes (Incomplete)
 [facility] AT [facility] Provider Note  CSN: [id number] Arrival date & time: 07/27/23 2247  Chief Complaint(s) Chest Pain, Diarrhea, and Hypotension  HPI Tiffany [name] is a 63 y.o. female {Add pertinent medical, surgical, social history, OB history to HPI:1}    Chest Pain Diarrhea   Past Medical History Past Medical History:  Diagnosis Date  . Abdominal pain   . Acute encephalopathy 11/21/2017  . Acute exacerbation of CHF (congestive heart failure) (HCC) 03/03/2022  . Acute GI bleeding 03/29/2021  . Acute metabolic encephalopathy 02/20/2020  . Aggressive behavior   . Agitation 11/22/2017  . Anoxic brain injury (HCC) 09/08/2016   C. Arrest due to respiratory failure and COPD exacerbation  . Anxiety   . Arthritis    all over (04/10/2016)  . Asthma 10/18/2010  . Binge eating disorder   . Blood loss anemia 04/24/2011  . CAP (community acquired pneumonia) 06/22/2015  . Cardiac arrest (HCC) 09/08/2016   PEA  . Carotid artery stenosis    1-39% bilateral by dopplers 11/2016  . Chronic diastolic (congestive) heart failure (HCC)   . Chronic pain syndrome 06/18/2012  . Chronic post-traumatic stress disorder (PTSD) 05/27/2018  . Chronic respiratory failure with hypoxia and hypercapnia (HCC) 06/22/2015   TRILOGY Vent >AVAPA-ES., Vt target 200-400, Max P 30 , PS max 20 , PS min 6-10 , E Max 6, E Min 4, Rate Auto AVAPS Rate 2 (titrate for pt comfort) , bleed O2 at 5l/m continuous flow .   SABRA Closed displaced fracture of fifth metacarpal bone 03/21/2018  . Cocaine use disorder, severe, in sustained remission (HCC) 12/17/2015  . Complication of anesthesia    decreased bp, decreased heart rate  . COPD (chronic obstructive pulmonary disease) (HCC) 07/08/2014  . Delusional disorder, persecutory type (HCC) 06/26/2021  . Depression   . Diabetic neuropathy (HCC) 04/24/2011  . Difficulty with speech 01/24/2018  . Disorder of nervous system   . Drug  abuse (HCC) 11/21/2017  . Dyslipidemia 04/24/2011  . E. coli UTI 02/20/2020  . Elevated troponin 04/28/2012  . Emphysema   . Encephalopathy 11/21/2017  . Essential hypertension 03/22/2016  . Facet arthropathy, lumbar 06/12/2022  . Fibula fracture 07/10/2016  . Frequent falls 10/11/2017  . GERD (gastroesophageal reflux disease)   . GI bleed 03/30/2021  . Gout 04/11/2017  . Heart attack (HCC) 1980s  . Hematochezia   . History of blood transfusion 1994   couldn't stop bleeding from my period  . History of drug abuse in remission (HCC) 11/28/2015   Quit in 2017  . Hyperlipidemia 03/31/2021  . Hyperlipidemia LDL goal <70   . Hypocalcemia   . Hypokalemia   . Hypomagnesemia   . Incontinence   . Manic depression (HCC)   . Morbid obesity (HCC) 10/18/2010  . Obstructive sleep apnea 10/18/2010  . On home oxygen  therapy    6L; 24/7 (04/10/2016)  . OSA on CPAP    wear mask sometimes (04/10/2016)  . Painless rectal bleeding 12/31/2015  . Paranoid (HCC)    sometimes; I'm on RX for it (04/10/2016)  . Periumbilical abdominal pain   . Prolonged Q-T interval on ECG   . Rectal bleeding 12/31/2015  . Recurrent syncope 07/09/2021  . Rhabdomyolysis 06/16/2021  . Schizoaffective disorder, bipolar type (HCC) 04/05/2018  . Seasonal allergies   . Seborrheic keratoses 12/31/2013  . Seizures (HCC)    don't know what kind; last one was ~ 1 yr ago (04/10/2016)  . Sinus bradycardia   .  Skin ulcer of sacrum, limited to breakdown of skin (HCC) 03/08/2022  . Spondylolisthesis at L3-L4 level 06/12/2022  . Stroke [facility]) 1980s   denies residual on 04/10/2016  . Thrush 09/19/2013  . Type 2 diabetes mellitus (HCC) 10/18/2010   Patient Active Problem List   Diagnosis Date Noted  . ESRD (end stage renal disease) on dialysis (HCC) 07/10/2023  . Sepsis (HCC) 06/27/2023  . Homicidal ideation 06/01/2023  . MDD (major depressive disorder), recurrent, severe, with psychosis (HCC) 03/10/2023  .  Influenza A 02/14/2023  . Community acquired pneumonia 02/14/2023  . ESRD on dialysis (HCC) 01/30/2023  . Schizoaffective disorder (HCC) 11/10/2022  . CKD (chronic kidney disease) stage 5, GFR less than 15 ml/min (HCC) 09/12/2022  . AKI (acute kidney injury) (HCC) 06/21/2022  . Spondylolisthesis at L3-L4 level 06/12/2022  . Facet arthropathy, lumbar 06/12/2022  . Pelvic floor weakness in female 06/12/2022  . Lower extremity weakness 06/11/2022  . Acute on chronic diastolic heart failure (HCC) 06/10/2022  . Hip pain 06/10/2022  . Urinary incontinence 03/08/2022  . Incontinence of feces 03/08/2022  . Neck pain 02/28/2022  . Dyspnea 02/03/2022  . Subacute osteomyelitis of lumbar spine (HCC) 09/27/2021  . Bipolar affective disorder, currently depressed, mild (HCC) 09/23/2021  . History of falling 07/01/2021  . Anemia of renal disease 06/16/2021  . Anxiety and depression 05/25/2021  . Physical deconditioning 05/25/2021  . Cauda equina compression (HCC) 04/24/2021  . Severe Spinal stenosis, lumbar 04/24/2021  . Hyperlipidemia 03/31/2021  . Coronary artery disease involving native coronary artery of native heart without angina pectoris 01/16/2021  . Anxiety disorder, unspecified 01/16/2021  . GERD (gastroesophageal reflux disease)   . Polypharmacy 11/17/2020  . Schizophrenia, paranoid (HCC) 02/20/2020  . Hypersomnia, Chronic   . Adnexal cyst, right 01/02/2020  . Genital herpes 11/25/2019  . Family discord 02/04/2019  . PTSD (post-traumatic stress disorder) 05/27/2018  . Schizoaffective disorder, bipolar type (HCC) 04/05/2018  . Acute hypoxic respiratory failure (HCC)   . Frequent falls 10/11/2017  . Dependence on continuous supplemental oxygen  05/14/2017  . Gout 04/11/2017  . Chronic kidney disease (CKD), stage IV (severe) (HCC) 12/15/2016  . Chest pain 11/03/2016  . History of Anoxic brain injury (HCC) 09/08/2016  . Overactive bladder 06/07/2016  . OSA and COPD overlap syndrome  (HCC)   . Osteoarthritis, multiple sites   . Acute renal failure superimposed on stage 4 chronic kidney disease (HCC) 11/24/2015  . Tobacco use disorder 07/22/2014  . Seizure (HCC) 01/04/2013  . Chronic low back pain with sciatica 06/18/2012  . Morbid obesity (HCC) 10/18/2010   Home Medication(s) Prior to Admission medications   Medication Sig Start Date End Date Taking? Authorizing Provider  albuterol  (VENTOLIN  HFA) 108 (90 Base) MCG/ACT inhaler Inhale 2 puffs into the lungs every 6 (six) hours as needed for wheezing or shortness of breath. 07/07/23   [place], Camellia PARAS, DO  amLODipine  (NORVASC ) 10 MG tablet Take 0.5 tablets (5 mg total) by mouth 2 (two) times daily. 07/07/23 10/05/23  [place], Camellia PARAS, DO  Asenapine  Maleate (SAPHRIS ) 10 MG SUBL Place 10 mg under the tongue at bedtime.    [provider]  atorvastatin  (LIPITOR ) 20 MG tablet Take 1 tablet (20 mg total) by mouth daily. 07/07/23 10/05/23  [place], Eric J, DO  calcitRIOL  (ROCALTROL ) 0.5 MCG capsule Take 1 capsule (0.5 mcg total) by mouth daily. 07/07/23   [place], Camellia PARAS, DO  carvedilol  (COREG ) 12.5 MG tablet Take 1 tablet (12.5 mg total) by mouth 2 (  two) times daily. 07/07/23 10/05/23  [place], Camellia PARAS, DO  cyclobenzaprine  (FLEXERIL ) 10 MG tablet Take 1 tablet (10 mg total) by mouth 3 (three) times daily. 10/09/22   McDiarmid, Krystal BIRCH, MD  dicyclomine  (BENTYL ) 10 MG capsule Take 1 capsule (10 mg total) by mouth 3 (three) times daily. 07/07/23   [place], Camellia PARAS, DO  DULoxetine  (CYMBALTA ) 30 MG capsule Take 1 capsule (30 mg total) by mouth daily. 07/07/23   [place], Camellia PARAS, DO  epoetin  alfa (EPOGEN ) 10000 UNIT/ML injection Inject 1 mL (10,000 Units total) into the vein every Monday, Wednesday, and Friday with hemodialysis. 11/22/22   Cam Charlie Loving, DO  escitalopram  (LEXAPRO ) 10 MG tablet Take 1 tablet (10 mg total) by mouth daily. 07/07/23 10/05/23  [place], Camellia PARAS, DO  ezetimibe  (ZETIA ) 10 MG tablet Take 1 tablet (10 mg total) by  mouth daily. Please call office to schedule an appt for further refills. Thank you 07/07/23   [place], Camellia PARAS, DO  ferrous sulfate  325 (65 FE) MG tablet Take 1 tablet (325 mg total) by mouth daily. 07/07/23 07/01/24  [place], Eric J, DO  Fluticasone -Umeclidin-Vilant (TRELEGY ELLIPTA ) 100-62.5-25 MCG/ACT AEPB Inhale 1 puff into the lungs daily. 07/07/23   [place], Camellia PARAS, DO  folic acid  (FOLVITE ) 1 MG tablet Take 1 tablet (1 mg total) by mouth daily. 07/07/23   [place], Camellia PARAS, DO  gabapentin  (NEURONTIN ) 100 MG capsule Take 1 capsule (100 mg total) by mouth at bedtime. 07/07/23 10/05/23  [place], Camellia PARAS, DO  hydrOXYzine  (ATARAX ) 10 MG tablet Take 1 tablet (10 mg total) by mouth 3 (three) times daily as needed for anxiety. Patient taking differently: Take 10 mg by mouth 3 (three) times daily. 03/14/23   Mannie Jerel PARAS, NP  lamoTRIgine  (LAMICTAL ) 100 MG tablet Take 1 tablet (100 mg total) by mouth at bedtime. 07/07/23 10/05/23  [place], Camellia PARAS, DO  lidocaine  (LIDODERM ) 5 % Place 1 patch onto the skin daily. Remove & Discard patch within 12 hours or as directed by MD Patient taking differently: Place 1 patch onto the skin daily as needed (pain). 02/20/23   Singh, Prashant K, MD  loperamide  (IMODIUM ) 2 MG capsule Take 1 capsule (2 mg total) by mouth 2 (two) times daily. Patient taking differently: Take 2 mg by mouth 3 (three) times daily. 10/09/22   McDiarmid, Krystal BIRCH, MD  MELATONIN PO Take 1 tablet by mouth at bedtime.    [provider]  metolazone  (ZAROXOLYN ) 2.5 MG tablet Take 1 tablet (2.5 mg total) by mouth every Monday, Wednesday, and Friday. 07/09/23 10/07/23  [place], Eric J, DO  mirtazapine  (REMERON ) 7.5 MG tablet Take 2 tablets (15 mg total) by mouth at bedtime. 07/07/23 10/05/23  [place], Eric J, DO  Multiple Vitamins-Minerals (CENTRUM SILVER  50+WOMEN) TABS Take 1 tablet by mouth at bedtime.    [provider]  omeprazole  (PRILOSEC) 40 MG capsule Take 1 capsule (40 mg total) by mouth every  morning. 07/07/23 10/05/23  [place], Camellia PARAS, DO  prazosin  (MINIPRESS ) 2 MG capsule Take 1 capsule (2 mg total) by mouth at bedtime. 02/26/23     QUEtiapine  (SEROQUEL ) 50 MG tablet Take 1 tablet (50 mg total) by mouth 2 (two) times daily. 07/07/23 10/05/23  [place], Camellia PARAS, DO  torsemide  (DEMADEX ) 20 MG tablet Take 1 tablet (20 mg total) by mouth 2 (two) times daily. Patient taking differently: No sig reported 02/13/23  Allergies Asa [aspirin ], Deltasone  Comitia.Como ], Latuda [lurasidone hcl], Lortab [hydrocodone -acetaminophen ], Magnesium -containing compounds, Ultram  [tramadol ], Codeine, Desyrel  [trazodone ], Apresoline  [hydralazine ], Peanut (diagnostic), Topamax  [topiramate ], Sulfa antibiotics, and Tape  Review of Systems Review of Systems  Cardiovascular:  Positive for chest pain.  Gastrointestinal:  Positive for diarrhea.   As noted in HPI  Physical Exam Vital Signs  I have reviewed the triage vital signs BP (!) 96/52   Pulse 68   Temp 98.5 F (36.9 C) (Oral)   Resp 19   SpO2 99%  *** Physical Exam  ED Results and Treatments Labs (all labs ordered are listed, but only abnormal results are displayed) Labs Reviewed  BASIC METABOLIC PANEL WITH GFR  CBC  TROPONIN I (HIGH SENSITIVITY)                                                                                                                         EKG  EKG Interpretation Date/Time:    Ventricular Rate:    PR Interval:    QRS Duration:    QT Interval:    QTC Calculation:   R Axis:      Text Interpretation:         Radiology No results found.  Medications Ordered in ED Medications  acetaminophen  (TYLENOL ) tablet 1,000 mg (has no administration in time range)   Procedures Procedures  (including critical care time) Medical Decision Making / ED Course   Medical Decision  Making Amount and/or Complexity of Data Reviewed Labs: ordered. Radiology: ordered.  Risk OTC drugs.    ***    Final Clinical Impression(s) / ED Diagnoses Final diagnoses:  None    This chart was dictated using voice recognition software.  Despite best efforts to proofread,  errors can occur which can change the documentation meaning.

## 2023-07-27 NOTE — ED Notes (Signed)
 Patient transported to X-ray

## 2023-07-27 NOTE — ED Triage Notes (Signed)
 Patient stated she fell yesterday and symptoms started from there. Patient is experiencing chest pain on left side that goes to her neck. Patient had dialysis yesterday. Patient stated it is normal for her to have runny stools and her blood pressure is fine.
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
## 2023-07-27 NOTE — ED Triage Notes (Signed)
 Patient came from home complaining of chest pain and diarrhea. Patient refused to provide information to EMS regarding symptoms stating [place] doing to much. Patient SBP was initially 112 SBP then 75 SBP. Patient refused IV with EMS. Patient is on diaylsis, left arm restricted. Wheelchair bound at baseline. EMS VS 84/40 BP 80 HR 95% RA 114 CBG

## 2023-07-28 DIAGNOSIS — E861 Hypovolemia: Secondary | ICD-10-CM

## 2023-07-28 DIAGNOSIS — I959 Hypotension, unspecified: Secondary | ICD-10-CM | POA: Diagnosis present

## 2023-07-28 LAB — CBC
HCT: 23.5 % — ABNORMAL LOW (ref 36.0–46.0)
HCT: 23.8 % — ABNORMAL LOW (ref 36.0–46.0)
Hemoglobin: 7.2 g/dL — ABNORMAL LOW (ref 12.0–15.0)
Hemoglobin: 7.3 g/dL — ABNORMAL LOW (ref 12.0–15.0)
MCH: 28.8 pg (ref 26.0–34.0)
MCH: 29.4 pg (ref 26.0–34.0)
MCHC: 30.3 g/dL (ref 30.0–36.0)
MCHC: 31.1 g/dL (ref 30.0–36.0)
MCV: 94.8 fL (ref 80.0–100.0)
MCV: 95.2 fL (ref 80.0–100.0)
Platelets: 265 K/uL (ref 150–400)
Platelets: 296 K/uL (ref 150–400)
RBC: 2.48 MIL/uL — ABNORMAL LOW (ref 3.87–5.11)
RBC: 2.5 MIL/uL — ABNORMAL LOW (ref 3.87–5.11)
RDW: 14.7 % (ref 11.5–15.5)
RDW: 14.8 % (ref 11.5–15.5)
WBC: 13.2 K/uL — ABNORMAL HIGH (ref 4.0–10.5)
WBC: 9.6 K/uL (ref 4.0–10.5)
nRBC: 0 % (ref 0.0–0.2)
nRBC: 0 % (ref 0.0–0.2)

## 2023-07-28 LAB — BASIC METABOLIC PANEL WITH GFR
Anion gap: 16 — ABNORMAL HIGH (ref 5–15)
BUN: 40 mg/dL — ABNORMAL HIGH (ref 8–23)
CO2: 26 mmol/L (ref 22–32)
Calcium: 9.2 mg/dL (ref 8.9–10.3)
Chloride: 92 mmol/L — ABNORMAL LOW (ref 98–111)
Creatinine, Ser: 6.53 mg/dL — ABNORMAL HIGH (ref 0.44–1.00)
GFR, Estimated: 7 mL/min — ABNORMAL LOW (ref >60)
Glucose, Bld: 87 mg/dL (ref 70–99)
Potassium: 2.6 mmol/L — CL (ref 3.5–5.1)
Sodium: 134 mmol/L — ABNORMAL LOW (ref 135–145)

## 2023-07-28 LAB — TROPONIN I (HIGH SENSITIVITY)
Troponin I (High Sensitivity): 24 ng/L — ABNORMAL HIGH (ref <18)
Troponin I (High Sensitivity): 25 ng/L — ABNORMAL HIGH (ref <18)

## 2023-07-28 LAB — CREATININE, SERUM
Creatinine, Ser: 6.91 mg/dL — ABNORMAL HIGH (ref 0.44–1.00)
GFR, Estimated: 6 mL/min — ABNORMAL LOW (ref >60)

## 2023-07-28 LAB — HEPATITIS B SURFACE ANTIGEN: Hepatitis B Surface Ag: NONREACTIVE

## 2023-07-28 LAB — I-STAT CG4 LACTIC ACID, ED
Lactic Acid, Venous: 0.5 mmol/L (ref 0.5–1.9)
Lactic Acid, Venous: 0.6 mmol/L (ref 0.5–1.9)

## 2023-07-28 MED ORDER — SODIUM CHLORIDE 0.9 % IV BOLUS (SEPSIS)
250.0000 mL | Freq: Once | INTRAVENOUS | Status: AC
  Administered 2023-07-28: 250 mL via INTRAVENOUS

## 2023-07-28 MED ORDER — ALTEPLASE 2 MG IJ SOLR: 2.0000 mg | Freq: Once | INTRAMUSCULAR | Status: DC | PRN

## 2023-07-28 MED ORDER — BUDESON-GLYCOPYRROL-FORMOTEROL 160-9-4.8 MCG/ACT IN AERO
2.0000 | INHALATION_SPRAY | Freq: Two times a day (BID) | RESPIRATORY_TRACT | Status: DC
  Administered 2023-07-28 – 2023-08-05 (×13): 2 via RESPIRATORY_TRACT
  Filled 2023-07-28: qty 5.9

## 2023-07-28 MED ORDER — POTASSIUM CHLORIDE 20 MEQ PO PACK: 40.0000 meq | PACK | Freq: Three times a day (TID) | ORAL | Status: DC

## 2023-07-28 MED ORDER — LAMOTRIGINE 25 MG PO TABS: 100.0000 mg | ORAL_TABLET | Freq: Every day | ORAL | Status: DC

## 2023-07-28 MED ORDER — METOLAZONE 2.5 MG PO TABS: 2.5000 mg | ORAL_TABLET | ORAL | Status: DC

## 2023-07-28 MED ORDER — HEPARIN SODIUM (PORCINE) 5000 UNIT/ML IJ SOLN
5000.0000 [IU] | Freq: Three times a day (TID) | INTRAMUSCULAR | Status: DC
  Administered 2023-07-28 – 2023-08-04 (×21): 5000 [IU] via SUBCUTANEOUS
  Filled 2023-07-28 (×21): qty 1

## 2023-07-28 MED ORDER — IBUPROFEN 400 MG PO TABS
400.0000 mg | ORAL_TABLET | Freq: Two times a day (BID) | ORAL | Status: DC | PRN
  Administered 2023-07-28: 400 mg via ORAL
  Filled 2023-07-28: qty 1

## 2023-07-28 MED ORDER — DULOXETINE HCL 30 MG PO CPEP
30.0000 mg | ORAL_CAPSULE | Freq: Every day | ORAL | Status: DC
  Administered 2023-07-29 – 2023-08-05 (×8): 30 mg via ORAL
  Filled 2023-07-28 (×8): qty 1

## 2023-07-28 MED ORDER — QUETIAPINE FUMARATE 25 MG PO TABS: 50.0000 mg | ORAL_TABLET | Freq: Two times a day (BID) | ORAL | Status: DC

## 2023-07-28 MED ORDER — MIDODRINE HCL 5 MG PO TABS
ORAL_TABLET | ORAL | Status: AC
Start: 2023-07-28 — End: 2023-07-28
  Filled 2023-07-28: qty 2

## 2023-07-28 MED ORDER — PENTAFLUOROPROP-TETRAFLUOROETH EX AERO: 1.0000 | INHALATION_SPRAY | CUTANEOUS | Status: DC | PRN

## 2023-07-28 MED ORDER — MIDODRINE HCL 5 MG PO TABS
10.0000 mg | ORAL_TABLET | Freq: Once | ORAL | Status: AC
  Administered 2023-07-28: 10 mg via ORAL
  Filled 2023-07-28: qty 2

## 2023-07-28 MED ORDER — AMLODIPINE BESYLATE 5 MG PO TABS: 5.0000 mg | ORAL_TABLET | Freq: Two times a day (BID) | ORAL | Status: DC

## 2023-07-28 MED ORDER — MIRTAZAPINE 15 MG PO TABS
15.0000 mg | ORAL_TABLET | Freq: Every day | ORAL | Status: DC
  Administered 2023-07-28 – 2023-08-04 (×8): 15 mg via ORAL
  Filled 2023-07-28 (×8): qty 1

## 2023-07-28 MED ORDER — PRAZOSIN HCL 2 MG PO CAPS: 2.0000 mg | ORAL_CAPSULE | Freq: Every day | ORAL | Status: DC

## 2023-07-28 MED ORDER — HEPARIN SODIUM (PORCINE) 1000 UNIT/ML DIALYSIS
1000.0000 [IU] | INTRAMUSCULAR | Status: DC | PRN
  Administered 2023-07-28: 3200 [IU]

## 2023-07-28 MED ORDER — ANTICOAGULANT SODIUM CITRATE 4% (200MG/5ML) IV SOLN
5.0000 mL | Status: DC | PRN
Start: 2023-07-28 — End: 2023-07-28

## 2023-07-28 MED ORDER — LIDOCAINE-PRILOCAINE 2.5-2.5 % EX CREA: 1.0000 | TOPICAL_CREAM | CUTANEOUS | Status: DC | PRN

## 2023-07-28 MED ORDER — PANTOPRAZOLE SODIUM 40 MG PO TBEC
40.0000 mg | DELAYED_RELEASE_TABLET | Freq: Every day | ORAL | Status: DC
  Administered 2023-07-29 – 2023-08-05 (×8): 40 mg via ORAL
  Filled 2023-07-28 (×8): qty 1

## 2023-07-28 MED ORDER — TORSEMIDE 20 MG PO TABS: 20.0000 mg | ORAL_TABLET | Freq: Two times a day (BID) | ORAL | Status: DC

## 2023-07-28 MED ORDER — EZETIMIBE 10 MG PO TABS
10.0000 mg | ORAL_TABLET | Freq: Every day | ORAL | Status: DC
  Administered 2023-07-29 – 2023-08-05 (×8): 10 mg via ORAL
  Filled 2023-07-28 (×8): qty 1

## 2023-07-28 MED ORDER — ESCITALOPRAM OXALATE 10 MG PO TABS
10.0000 mg | ORAL_TABLET | Freq: Every day | ORAL | Status: DC
  Administered 2023-07-29 – 2023-08-05 (×8): 10 mg via ORAL
  Filled 2023-07-28 (×8): qty 1

## 2023-07-28 MED ORDER — HYDROXYZINE HCL 10 MG PO TABS
10.0000 mg | ORAL_TABLET | Freq: Three times a day (TID) | ORAL | Status: DC | PRN
  Administered 2023-07-29: 10 mg via ORAL
  Filled 2023-07-28: qty 1

## 2023-07-28 MED ORDER — LIDOCAINE HCL (PF) 1 % IJ SOLN
5.0000 mL | INTRAMUSCULAR | Status: DC | PRN
Start: 2023-07-28 — End: 2023-07-28

## 2023-07-28 MED ORDER — ASENAPINE MALEATE 5 MG SL SUBL
10.0000 mg | SUBLINGUAL_TABLET | Freq: Every day | SUBLINGUAL | Status: DC
  Administered 2023-07-28: 10 mg via SUBLINGUAL
  Filled 2023-07-28 (×2): qty 2

## 2023-07-28 MED ORDER — DARBEPOETIN ALFA 100 MCG/0.5ML IJ SOSY
100.0000 ug | PREFILLED_SYRINGE | Freq: Once | INTRAMUSCULAR | Status: AC
  Administered 2023-07-28: 100 ug via SUBCUTANEOUS
  Filled 2023-07-28: qty 0.5

## 2023-07-28 MED ORDER — NALOXONE HCL 0.4 MG/ML IJ SOLN
0.4000 mg | Freq: Once | INTRAMUSCULAR | Status: AC
  Administered 2023-07-28: 0.4 mg via INTRAVENOUS
  Filled 2023-07-28: qty 1

## 2023-07-28 MED ORDER — ATORVASTATIN CALCIUM 10 MG PO TABS
20.0000 mg | ORAL_TABLET | Freq: Every day | ORAL | Status: DC
  Administered 2023-07-29 – 2023-08-05 (×8): 20 mg via ORAL
  Filled 2023-07-28 (×8): qty 2

## 2023-07-28 MED ORDER — DICYCLOMINE HCL 10 MG PO CAPS
10.0000 mg | ORAL_CAPSULE | Freq: Three times a day (TID) | ORAL | Status: DC
  Administered 2023-07-28 – 2023-08-04 (×20): 10 mg via ORAL
  Filled 2023-07-28 (×22): qty 1

## 2023-07-28 MED ORDER — CHLORHEXIDINE GLUCONATE CLOTH 2 % EX PADS
6.0000 | MEDICATED_PAD | Freq: Every day | CUTANEOUS | Status: DC
Start: 2023-07-29 — End: 2023-08-01
  Administered 2023-07-29 – 2023-07-31 (×3): 6 via TOPICAL

## 2023-07-28 MED ORDER — CARVEDILOL 12.5 MG PO TABS: 12.5000 mg | ORAL_TABLET | Freq: Two times a day (BID) | ORAL | Status: DC

## 2023-07-28 MED ORDER — CALCITRIOL 0.5 MCG PO CAPS
0.5000 ug | ORAL_CAPSULE | Freq: Every day | ORAL | Status: DC
  Administered 2023-07-29 – 2023-08-05 (×8): 0.5 ug via ORAL
  Filled 2023-07-28 (×2): qty 1
  Filled 2023-07-28: qty 2
  Filled 2023-07-28 (×2): qty 1
  Filled 2023-07-28 (×2): qty 2
  Filled 2023-07-28 (×2): qty 1
  Filled 2023-07-28: qty 2

## 2023-07-28 MED ORDER — SODIUM CHLORIDE 0.9 % IV BOLUS
500.0000 mL | Freq: Once | INTRAVENOUS | Status: AC
  Administered 2023-07-28: 500 mL via INTRAVENOUS

## 2023-07-28 NOTE — ED Notes (Signed)
 This nurse alerted EDP Cardama, MD to patient's blood pressure being 82/45 with a MAP of 57.

## 2023-07-28 NOTE — ED Notes (Signed)
 Pt unwilling to allow (1) person to remove her from the bedpan. Per pt request, will not return until (2) staff members are available to collectively assist.

## 2023-07-28 NOTE — Progress Notes (Signed)
   07/28/23 1810  Vitals  Temp 97.8 F (36.6 C)  Pulse Rate 81  Resp (!) 21  BP (!) 146/81  SpO2 100 %  O2 Device Room Air  Weight  (refused weight)  Type of Weight Post-Dialysis  Oxygen  Therapy  Patient Activity (if Appropriate) In bed  Pulse Oximetry Type Continuous  Post Treatment  Dialyzer Clearance Other (Comment) (Blood unreturned due to bubbles. Machine proompting to disconnect without returning blood.)  Hemodialysis Intake (mL) 0 mL  Liters Processed 38.2  Fluid Removed (mL) 300 mL  Tolerated HD Treatment Yes  Post-Hemodialysis Comments Confused and agitated. HD at 2 hrs and 8 mins.    Received patient in bed to unit.  Alert and oriented.  Informed consent signed and in chart.    TX duration:2hrs   Patient tolerated well.  Transported back to the room  Alert, without acute distress.  Hand-off given to patient's nurse.    Access used: CVC Access issues: none   Total UF removed: 300 Medication(s) given: see eMar  Woodfin Dolores RN [facility]

## 2023-07-28 NOTE — Progress Notes (Signed)
 [facility] night cross cover note:   I was notified by the patient's RN that the patient is complaining of some mild discomfort on her side after her fall yesterday.  The patient is specifically requesting ibuprofen  400 mg, with the patient noting that she typically takes 400 mg of ibuprofen  on a as needed basis for pain at home.   It appears that the patient is end-stage renal disease, already on hemodialysis.   I subsequently ordered ibuprofen  400 mg p.o. twice daily as needed for pain.    Update: Significant improvement in the patient's discomfort following dose of 400 mg of ibuprofen , with the patient requesting an additional medication to help further address her pain.   I subsequently added oxycodone  IR 5 mg p.o. every 6 hours as needed.     Update: I ordered benadryl  25 mg po x 1 dose prn for pruritus.      Eva Pore, DO Hospitalist

## 2023-07-28 NOTE — ED Notes (Signed)
 Called CCMD at [phone] to initiate cardiac monitoring as ordered.

## 2023-07-28 NOTE — ED Notes (Addendum)
 Pt on bedpan, informed staff that it may take a while. Pt given call bell and instructed to call when she's ready for assistance.

## 2023-07-28 NOTE — ED Notes (Signed)
 Paged on-call for Dr Tita Qua practice for Dr Doretha per verbal order of Dr Trine

## 2023-07-28 NOTE — ED Notes (Signed)
 Asked patient if she makes urine, stated she does, but she does not want to give urine sample at this time

## 2023-07-28 NOTE — ED Notes (Signed)
 Patient arousable with a sternum rub, narcan  given per MD
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
## 2023-07-28 NOTE — ED Notes (Signed)
 This nurse alerted EDP Cardama, MD regarding the completion of the patient's bolus and how her systolic blood pressure is still maintaining in the 80s.

## 2023-07-28 NOTE — ED Notes (Addendum)
 This nurse and EDP Cardama MD, went into patient's room to reassess patient. Patient was found asleep but easily aroused and alert. Patient then would go right back to sleep. Patient was found with her bag close with her in the bed. Patient blood pressure was still hypotensive, Cardama MD gave the verbal order for bolus of normal saline. This nurse will continue to monitor patient per provider.

## 2023-07-28 NOTE — ED Notes (Signed)
 Dr Landa on call for practice at [facility]. Paged Consult.

## 2023-07-28 NOTE — Consult Note (Signed)
 [facility]  INPATIENT CONSULTATION  Reason for Consultation: ESRD Requesting Provider: Dr. Georgina  HPI: Tiffany [name] is an 63 y.o. female with ESRD TTS, HFpEF, schizoaffective DO, Anemia, OSA, GERD, tobacco use, h/o seizure d/o currently admitted and nephrology is consulted for ESRD management.   Pt says she presented for evaluation after a fall a few days prior.  [name] also c/o to some providers of loose stools.  IN the ED she had some BPs in the 80s and rec'd a total of 1L NS boluses.  She denies HA, dyspnea, f/c.  CXR with muld pulm congestion, labs with K 2.6, lactate 0.5, WBC 12.2, Hb 7.2 (last outpt Hb 7.3 on 7/3).   [name] admitted to the hospitalist. BP meds on hold. SHe's afebrile, current BP 110s.   Last outpt HD was 7/10 ran full 4hrs post wt 84.5kg, generally 3.5-6kg > EDW.  She had HD 7/3, 7/5, 7/10 at [facility].   PMH: Past Medical History:  Diagnosis Date   Abdominal pain    Acute encephalopathy 11/21/2017   Acute exacerbation of CHF (congestive heart failure) (HCC) 03/03/2022   Acute GI bleeding 03/29/2021   Acute metabolic encephalopathy 02/20/2020   Aggressive behavior    Agitation 11/22/2017   Anoxic brain injury (HCC) 09/08/2016   C. Arrest due to respiratory failure and COPD exacerbation   Anxiety    Arthritis    all over (04/10/2016)   Asthma 10/18/2010   Binge eating disorder    Blood loss anemia 04/24/2011   CAP (community acquired pneumonia) 06/22/2015   Cardiac arrest (HCC) 09/08/2016   PEA   Carotid artery stenosis    1-39% bilateral by dopplers 11/2016   Chronic diastolic (congestive) heart failure (HCC)    Chronic pain syndrome 06/18/2012   Chronic post-traumatic stress disorder (PTSD) 05/27/2018   Chronic respiratory failure with hypoxia and hypercapnia (HCC) 06/22/2015   TRILOGY Vent >AVAPA-ES., Vt target 200-400, Max P 30 , PS max 20 , PS min 6-10 , E Max 6, E Min 4, Rate Auto AVAPS Rate 2 (titrate for pt comfort) , bleed O2 at  5l/m continuous flow .    Closed displaced fracture of fifth metacarpal bone 03/21/2018   Cocaine use disorder, severe, in sustained remission (HCC) 12/17/2015   Complication of anesthesia    decreased bp, decreased heart rate   COPD (chronic obstructive pulmonary disease) (HCC) 07/08/2014   Delusional disorder, persecutory type (HCC) 06/26/2021   Depression    Diabetic neuropathy (HCC) 04/24/2011   Difficulty with speech 01/24/2018   Disorder of nervous system    Drug abuse (HCC) 11/21/2017   Dyslipidemia 04/24/2011   E. coli UTI 02/20/2020   Elevated troponin 04/28/2012   Emphysema    Encephalopathy 11/21/2017   Essential hypertension 03/22/2016   Facet arthropathy, lumbar 06/12/2022   Fibula fracture 07/10/2016   Frequent falls 10/11/2017   GERD (gastroesophageal reflux disease)    GI bleed 03/30/2021   Gout 04/11/2017   Heart attack (HCC) 1980s   Hematochezia    History of blood transfusion 1994   couldn't stop bleeding from my period   History of drug abuse in remission (HCC) 11/28/2015   Quit in 2017   Hyperlipidemia 03/31/2021   Hyperlipidemia LDL goal <70    Hypocalcemia    Hypokalemia    Hypomagnesemia    Incontinence    Manic depression (HCC)    Morbid obesity (HCC) 10/18/2010   Obstructive sleep apnea 10/18/2010   On home oxygen   therapy    6L; 24/7 (04/10/2016)   OSA on CPAP    wear mask sometimes (04/10/2016)   Painless rectal bleeding 12/31/2015   Paranoid (HCC)    sometimes; I'm on RX for it (04/10/2016)   Periumbilical abdominal pain    Prolonged Q-T interval on ECG    Rectal bleeding 12/31/2015   Recurrent syncope 07/09/2021   Rhabdomyolysis 06/16/2021   Schizoaffective disorder, bipolar type (HCC) 04/05/2018   Seasonal allergies    Seborrheic keratoses 12/31/2013   Seizures (HCC)    don't know what kind; last one was ~ 1 yr ago (04/10/2016)   Sinus bradycardia    Skin ulcer of sacrum, limited to breakdown of skin (HCC) 03/08/2022    Spondylolisthesis at L3-L4 level 06/12/2022   Stroke (HCC) 1980s   denies residual on 04/10/2016   Thrush 09/19/2013   Type 2 diabetes mellitus (HCC) 10/18/2010   PSH: Past Surgical History:  Procedure Laterality Date   A/V FISTULAGRAM N/A 02/01/2023   Procedure: A/V Fistulagram;  Surgeon: Norine Tiffany LABOR, MD;  Location: [facility] INVASIVE CV LAB;  Service: Cardiovascular;  Laterality: N/A;   AV FISTULA PLACEMENT Left 12/21/2022   Procedure: INSERTION OF LEFT ARM ARTERIOVENOUS (AV) GORE-TEX GRAFT;  Surgeon: Magda Debby SAILOR, MD;  Location: [facility] OR;  Service: Vascular;  Laterality: Left;   CESAREAN SECTION  1997   COLONOSCOPY WITH PROPOFOL  N/A 04/01/2021   Procedure: COLONOSCOPY WITH PROPOFOL ;  Surgeon: Rollin Dover, MD;  Location: [facility];  Service: Gastroenterology;  Laterality: N/A;  Rectal bleeding with drop in hemoglobin to 7.2 g/dL   HERNIA REPAIR     IR CHOLANGIOGRAM EXISTING TUBE  07/20/2016   IR PERC CHOLECYSTOSTOMY  05/10/2016   IR RADIOLOGIST EVAL & MGMT  06/08/2016   IR RADIOLOGIST EVAL & MGMT  06/29/2016   IR SINUS/FIST TUBE CHK-NON GI  07/12/2016   RIGHT/LEFT HEART CATH AND CORONARY ANGIOGRAPHY N/A 06/19/2017   Procedure: RIGHT/LEFT HEART CATH AND CORONARY ANGIOGRAPHY;  Surgeon: Cherrie Toribio SAUNDERS, MD;  Location: [facility] INVASIVE CV LAB;  Service: Cardiovascular;  Laterality: N/A;   TIBIA IM NAIL INSERTION Right 07/12/2016   Procedure: INTRAMEDULLARY (IM) NAIL RIGHT TIBIA;  Surgeon: Jerri Kay HERO, MD;  Location: [facility] OR;  Service: Orthopedics;  Laterality: Right;   UMBILICAL HERNIA REPAIR  ~ 1963   that's why I don't have a belly button   VAGINAL HYSTERECTOMY       Past Medical History:  Diagnosis Date   Abdominal pain    Acute encephalopathy 11/21/2017   Acute exacerbation of CHF (congestive heart failure) (HCC) 03/03/2022   Acute GI bleeding 03/29/2021   Acute metabolic encephalopathy 02/20/2020   Aggressive behavior    Agitation 11/22/2017   Anoxic brain injury (HCC)  09/08/2016   C. Arrest due to respiratory failure and COPD exacerbation   Anxiety    Arthritis    all over (04/10/2016)   Asthma 10/18/2010   Binge eating disorder    Blood loss anemia 04/24/2011   CAP (community acquired pneumonia) 06/22/2015   Cardiac arrest (HCC) 09/08/2016   PEA   Carotid artery stenosis    1-39% bilateral by dopplers 11/2016   Chronic diastolic (congestive) heart failure (HCC)    Chronic pain syndrome 06/18/2012   Chronic post-traumatic stress disorder (PTSD) 05/27/2018   Chronic respiratory failure with hypoxia and hypercapnia (HCC) 06/22/2015   TRILOGY Vent >AVAPA-ES., Vt target 200-400, Max P 30 , PS max 20 , PS min 6-10 , E Max 6, E Min 4,  Rate Auto AVAPS Rate 2 (titrate for pt comfort) , bleed O2 at 5l/m continuous flow .    Closed displaced fracture of fifth metacarpal bone 03/21/2018   Cocaine use disorder, severe, in sustained remission (HCC) 12/17/2015   Complication of anesthesia    decreased bp, decreased heart rate   COPD (chronic obstructive pulmonary disease) (HCC) 07/08/2014   Delusional disorder, persecutory type (HCC) 06/26/2021   Depression    Diabetic neuropathy (HCC) 04/24/2011   Difficulty with speech 01/24/2018   Disorder of nervous system    Drug abuse (HCC) 11/21/2017   Dyslipidemia 04/24/2011   E. coli UTI 02/20/2020   Elevated troponin 04/28/2012   Emphysema    Encephalopathy 11/21/2017   Essential hypertension 03/22/2016   Facet arthropathy, lumbar 06/12/2022   Fibula fracture 07/10/2016   Frequent falls 10/11/2017   GERD (gastroesophageal reflux disease)    GI bleed 03/30/2021   Gout 04/11/2017   Heart attack (HCC) 1980s   Hematochezia    History of blood transfusion 1994   couldn't stop bleeding from my period   History of drug abuse in remission (HCC) 11/28/2015   Quit in 2017   Hyperlipidemia 03/31/2021   Hyperlipidemia LDL goal <70    Hypocalcemia    Hypokalemia    Hypomagnesemia    Incontinence    Manic  depression (HCC)    Morbid obesity (HCC) 10/18/2010   Obstructive sleep apnea 10/18/2010   On home oxygen  therapy    6L; 24/7 (04/10/2016)   OSA on CPAP    wear mask sometimes (04/10/2016)   Painless rectal bleeding 12/31/2015   Paranoid (HCC)    sometimes; I'm on RX for it (04/10/2016)   Periumbilical abdominal pain    Prolonged Q-T interval on ECG    Rectal bleeding 12/31/2015   Recurrent syncope 07/09/2021   Rhabdomyolysis 06/16/2021   Schizoaffective disorder, bipolar type (HCC) 04/05/2018   Seasonal allergies    Seborrheic keratoses 12/31/2013   Seizures (HCC)    don't know what kind; last one was ~ 1 yr ago (04/10/2016)   Sinus bradycardia    Skin ulcer of sacrum, limited to breakdown of skin (HCC) 03/08/2022   Spondylolisthesis at L3-L4 level 06/12/2022   Stroke [facility]) 1980s   denies residual on 04/10/2016   Thrush 09/19/2013   Type 2 diabetes mellitus (HCC) 10/18/2010    Medications:  I have reviewed the patient's current medications.  (Not in a hospital admission)   ALLERGIES:   Allergies  Allergen Reactions   Dorethia Goldman ] Other (See Comments)    GI bleed   Deltasone  [Prednisone ] Anaphylaxis, Swelling and Other (See Comments)    Swelling of tongue, lips, throat   Latuda [Lurasidone Hcl] Anaphylaxis   Lortab [Hydrocodone -Acetaminophen ] Hives and Shortness Of Breath   Magnesium -Containing Compounds Anaphylaxis and Other (See Comments)    Tolerated Ensure   Ultram  [Tramadol ] Anaphylaxis, Swelling and Rash   Codeine Nausea And Vomiting and Rash   Desyrel  [Trazodone ] Other (See Comments)    Paranoia    Apresoline  [Hydralazine ] Other (See Comments)    Muscle spasms   Peanut (Diagnostic) Hives and Other (See Comments)    Raw peanuts only  OK to eat peanut butter.   Topamax  [Topiramate ] Other (See Comments)    Paranoia   Sulfa Antibiotics Itching   Tape Rash    FAM HX: Family History  Problem Relation Age of Onset   Cancer Mother        lung    Depression Mother  Cancer Father        prostate   Depression Sister    Anxiety disorder Sister    Schizophrenia Sister    Bipolar disorder Sister    Depression Sister    Depression Brother    Heart failure Other        cousin    Social History:   reports that she quit smoking about 7 years ago. Her smoking use included cigarettes. She started smoking about 46 years ago. She has a 58.6 pack-year smoking history. She has been exposed to tobacco smoke. She has never used smokeless tobacco. She reports that she does not currently use drugs after having used the following drugs: Cocaine. She reports that she does not drink alcohol.  ROS: 12 system ROS neg except per HPI  Blood pressure (!) 120/92, pulse 79, temperature 98.3 F (36.8 C), temperature source Oral, resp. rate 13, SpO2 98%. PHYSICAL EXAM: Gen: awake and alert, appears at her baseline  Eyes: EOMI, glasses ENT: edentulous Neck: supple, RIJ TDC c/d/i CV: RRR Abd:  soft Lungs: normal WOB on RA Extr: 1+ LE edema Neuro: nonfocal, oriented but tangential and poor historian Skin: some bruising noted on the LE   Results for orders placed or performed during the hospital encounter of 07/27/23 (from the past 48 hours)  Basic metabolic panel     Status: Abnormal   Collection Time: 07/27/23 11:50 PM  Result Value Ref Range   Sodium 134 (L) 135 - 145 mmol/L   Potassium 2.6 (LL) 3.5 - 5.1 mmol/L    Comment: CRITICAL RESULT CALLED TO, READ BACK BY AND VERIFIED WITH LAJOYCE GRADE, RN [id number] 07/28/2023 SANDOVAL K   Chloride 92 (L) 98 - 111 mmol/L   CO2 26 22 - 32 mmol/L   Glucose, Bld 87 70 - 99 mg/dL    Comment: Glucose reference range applies only to samples taken after fasting for at least 8 hours.   BUN 40 (H) 8 - 23 mg/dL   Creatinine, Ser 3.46 (H) 0.44 - 1.00 mg/dL   Calcium  9.2 8.9 - 10.3 mg/dL   GFR, Estimated 7 (L) >60 mL/min    Comment: (NOTE) Calculated using the CKD-EPI Creatinine Equation (2021)    Anion gap 16 (H) 5 -  15    Comment: Performed at [facility], [address] [place], [place] [address]  CBC     Status: Abnormal   Collection Time: 07/27/23 11:50 PM  Result Value Ref Range   WBC 13.2 (H) 4.0 - 10.5 K/uL   RBC 2.50 (L) 3.87 - 5.11 MIL/uL   Hemoglobin 7.2 (L) 12.0 - 15.0 g/dL   HCT 76.1 (L) 63.9 - 53.9 %   MCV 95.2 80.0 - 100.0 fL   MCH 28.8 26.0 - 34.0 pg   MCHC 30.3 30.0 - 36.0 g/dL   RDW 85.1 88.4 - 84.4 %   Platelets 296 150 - 400 K/uL   nRBC 0.0 0.0 - 0.2 %    Comment: Performed at [facility], [address], [place], [place] [address]  Troponin I (High Sensitivity)     Status: Abnormal   Collection Time: 07/27/23 11:50 PM  Result Value Ref Range   Troponin I (High Sensitivity) 25 (H) <18 ng/L    Comment: (NOTE) Elevated high sensitivity troponin I (hsTnI) values and significant  changes across serial measurements may suggest ACS but many other  chronic and acute conditions are known to elevate hsTnI results.  Refer to the  Links section for chest pain algorithms and additional  guidance. Performed at [facility], [address] [place], [place] [address]   Troponin I (High Sensitivity)     Status: Abnormal   Collection Time: 07/28/23  1:17 AM  Result Value Ref Range   Troponin I (High Sensitivity) 24 (H) <18 ng/L    Comment: (NOTE) Elevated high sensitivity troponin I (hsTnI) values and significant  changes across serial measurements may suggest ACS but many other  chronic and acute conditions are known to elevate hsTnI results.  Refer to the Links section for chest pain algorithms and additional  guidance. Performed at [facility], [address] [place], [place] [address]   I-Stat CG4 Lactic Acid     Status: None   Collection Time: 07/28/23  6:50 AM  Result Value Ref Range   Lactic Acid, Venous 0.5 0.5 - 1.9 mmol/L  CBC     Status: Abnormal   Collection Time: 07/28/23  7:44 AM  Result Value Ref Range   WBC 9.6 4.0 - 10.5 K/uL    RBC 2.48 (L) 3.87 - 5.11 MIL/uL   Hemoglobin 7.3 (L) 12.0 - 15.0 g/dL   HCT 76.4 (L) 63.9 - 53.9 %   MCV 94.8 80.0 - 100.0 fL   MCH 29.4 26.0 - 34.0 pg   MCHC 31.1 30.0 - 36.0 g/dL   RDW 85.2 88.4 - 84.4 %   Platelets 265 150 - 400 K/uL   nRBC 0.0 0.0 - 0.2 %    Comment: Performed at [facility], [address], [place], [place] [address]  Creatinine, serum     Status: Abnormal   Collection Time: 07/28/23  7:44 AM  Result Value Ref Range   Creatinine, Ser 6.91 (H) 0.44 - 1.00 mg/dL   GFR, Estimated 6 (L) >60 mL/min    Comment: (NOTE) Calculated using the CKD-EPI Creatinine Equation (2021) Performed at [facility], [address], [place], [place] [address]   I-Stat CG4 Lactic Acid     Status: None   Collection Time: 07/28/23 11:19 AM  Result Value Ref Range   Lactic Acid, Venous 0.6 0.5 - 1.9 mmol/L   *Note: Due to a large number of results and/or encounters for the requested time period, some results have not been displayed. A complete set of results can be found in Results Review.    DG Chest 2 View Result Date: 07/27/2023 CLINICAL DATA:  Chest pain EXAM: CHEST - 2 VIEW COMPARISON:  07/12/2023 FINDINGS: Lung volumes are small, but are stable since prior examination. Lungs are clear. No pneumothorax or pleural effusion. Right internal jugular hemodialysis catheter is stable, tip within the superior right atrium. Stable cardiomegaly. Pulmonary vascularity is unchanged with central pulmonary vascular congestion without superimposed overt pulmonary edema. No acute bone abnormality. IMPRESSION: 1. Stable cardiomegaly with central pulmonary vascular congestion. 2. Stable pulmonary hypoinflation. Electronically Signed   By: Dorethia Molt M.D.   On: 07/27/2023 23:35  OUtpt HD:  TTS [facility] 4hrs EDW 82kg BFR 500 DFR 1.5 2K/2Ca No heparin , ESA, VDRA per orders.  Assessment/Plan  **CP s/p fall:  sounds mechanical, no concern for ACS, per primary.   **Hypotension:  improved  with 1L NS poss hypovolemia with diarrhea, also on midodrine .  Minimal UF with HD today.    **ESRD on HD: usual schedule TTS, will order for today to maintain schedule.  Thankfully she's going to [facility] with some more regularity now  **Anemia:  Hb baseline recently 7s, no e/o active bleeding.  Iron  fine 6/14. Dose with ESA.   **BMM:  continue outpt binder, doubt she takes regularly. On calcitriol .   Will follow  Tiffany [name] 07/28/2023, 12:30 PM

## 2023-07-28 NOTE — ED Notes (Signed)
 Pt removed herself from soiled bedpan and tossed it onto the floor.

## 2023-07-28 NOTE — ED Notes (Signed)
 This nurse alerted EDP Cardama MD to patient's BP maintaining a systolic blood pressure in the 80s. This nurse requested for IV fluids verbal order. EDP Cardama MD verbally ordered patient to receive 250 bolus of normal saline to go over an hour. This nurse will continue to monitor patient.

## 2023-07-28 NOTE — ED Notes (Addendum)
 EDP Cardama MD updated regarding patient maintain a systolic blood pressure in the 80s. This nurse is asking for new orders for patient's care.

## 2023-07-29 ENCOUNTER — Observation Stay (HOSPITAL_COMMUNITY)

## 2023-07-29 DIAGNOSIS — E861 Hypovolemia: Secondary | ICD-10-CM | POA: Diagnosis not present

## 2023-07-29 LAB — TECHNOLOGIST SMEAR REVIEW: Plt Morphology: NORMAL

## 2023-07-29 LAB — BLOOD CULTURE ID PANEL (REFLEXED) - BCID2

## 2023-07-29 LAB — CBC
HCT: 22.8 % — ABNORMAL LOW (ref 36.0–46.0)
Hemoglobin: 7 g/dL — ABNORMAL LOW (ref 12.0–15.0)
MCH: 28.9 pg (ref 26.0–34.0)
MCHC: 30.7 g/dL (ref 30.0–36.0)
MCV: 94.2 fL (ref 80.0–100.0)
Platelets: 315 K/uL (ref 150–400)
RBC: 2.42 MIL/uL — ABNORMAL LOW (ref 3.87–5.11)
RDW: 14.5 % (ref 11.5–15.5)
WBC: 6.4 K/uL (ref 4.0–10.5)
nRBC: 0 % (ref 0.0–0.2)

## 2023-07-29 LAB — IRON AND TIBC
Iron: 86 ug/dL (ref 28–170)
Saturation Ratios: 40 % — ABNORMAL HIGH (ref 10.4–31.8)
TIBC: 217 ug/dL — ABNORMAL LOW (ref 250–450)
UIBC: 131 ug/dL

## 2023-07-29 LAB — CBC WITH DIFFERENTIAL/PLATELET
Abs Immature Granulocytes: 0.05 K/uL (ref 0.00–0.07)
Basophils Absolute: 0 K/uL (ref 0.0–0.1)
Basophils Relative: 0 %
Eosinophils Absolute: 0.1 K/uL (ref 0.0–0.5)
Eosinophils Relative: 2 %
HCT: 22.4 % — ABNORMAL LOW (ref 36.0–46.0)
Hemoglobin: 7 g/dL — ABNORMAL LOW (ref 12.0–15.0)
Immature Granulocytes: 1 %
Lymphocytes Relative: 17 %
Lymphs Abs: 1 K/uL (ref 0.7–4.0)
MCH: 29.3 pg (ref 26.0–34.0)
MCHC: 31.3 g/dL (ref 30.0–36.0)
MCV: 93.7 fL (ref 80.0–100.0)
Monocytes Absolute: 0.6 K/uL (ref 0.1–1.0)
Monocytes Relative: 10 %
Neutro Abs: 4.1 K/uL (ref 1.7–7.7)
Neutrophils Relative %: 70 %
Platelets: 331 K/uL (ref 150–400)
RBC: 2.39 MIL/uL — ABNORMAL LOW (ref 3.87–5.11)
RDW: 14.3 % (ref 11.5–15.5)
WBC: 5.9 K/uL (ref 4.0–10.5)
nRBC: 0 % (ref 0.0–0.2)

## 2023-07-29 LAB — BASIC METABOLIC PANEL WITH GFR
Anion gap: 13 (ref 5–15)
BUN: 29 mg/dL — ABNORMAL HIGH (ref 8–23)
CO2: 24 mmol/L (ref 22–32)
Calcium: 8.6 mg/dL — ABNORMAL LOW (ref 8.9–10.3)
Chloride: 96 mmol/L — ABNORMAL LOW (ref 98–111)
Creatinine, Ser: 4.63 mg/dL — ABNORMAL HIGH (ref 0.44–1.00)
GFR, Estimated: 10 mL/min — ABNORMAL LOW (ref >60)
Glucose, Bld: 89 mg/dL (ref 70–99)
Potassium: 3 mmol/L — ABNORMAL LOW (ref 3.5–5.1)
Sodium: 133 mmol/L — ABNORMAL LOW (ref 135–145)

## 2023-07-29 LAB — RETICULOCYTES
Immature Retic Fract: 14.2 % (ref 2.3–15.9)
RBC.: 2.36 MIL/uL — ABNORMAL LOW (ref 3.87–5.11)
Retic Count, Absolute: 46.8 K/uL (ref 19.0–186.0)
Retic Ct Pct: 1.9 % (ref 0.4–3.1)

## 2023-07-29 LAB — MAGNESIUM: Magnesium: 1.9 mg/dL (ref 1.7–2.4)

## 2023-07-29 LAB — VITAMIN B12: Vitamin B-12: 501 pg/mL (ref 180–914)

## 2023-07-29 LAB — PROTIME-INR
INR: 1 (ref 0.8–1.2)
Prothrombin Time: 13.5 s (ref 11.4–15.2)

## 2023-07-29 LAB — FERRITIN: Ferritin: 687 ng/mL — ABNORMAL HIGH (ref 11–307)

## 2023-07-29 LAB — FOLATE: Folate: 25.5 ng/mL (ref >5.9)

## 2023-07-29 MED ORDER — NALOXONE HCL 0.4 MG/ML IJ SOLN: 0.4000 mg | INTRAMUSCULAR | Status: DC | PRN

## 2023-07-29 MED ORDER — LAMOTRIGINE 100 MG PO TABS
100.0000 mg | ORAL_TABLET | Freq: Every day | ORAL | Status: DC
  Administered 2023-07-29 – 2023-08-04 (×7): 100 mg via ORAL
  Filled 2023-07-29 (×7): qty 1

## 2023-07-29 MED ORDER — CYCLOBENZAPRINE HCL 10 MG PO TABS
10.0000 mg | ORAL_TABLET | Freq: Three times a day (TID) | ORAL | Status: DC | PRN
  Administered 2023-08-02 – 2023-08-04 (×3): 10 mg via ORAL
  Filled 2023-07-29 (×3): qty 1

## 2023-07-29 MED ORDER — GABAPENTIN 100 MG PO CAPS
100.0000 mg | ORAL_CAPSULE | Freq: Every day | ORAL | Status: DC
  Administered 2023-07-29 – 2023-08-04 (×7): 100 mg via ORAL
  Filled 2023-07-29 (×7): qty 1

## 2023-07-29 MED ORDER — OXYCODONE HCL 5 MG PO TABS
5.0000 mg | ORAL_TABLET | Freq: Four times a day (QID) | ORAL | Status: DC | PRN
  Administered 2023-07-29 – 2023-08-02 (×8): 5 mg via ORAL
  Filled 2023-07-29 (×9): qty 1

## 2023-07-29 MED ORDER — QUETIAPINE FUMARATE 50 MG PO TABS
50.0000 mg | ORAL_TABLET | Freq: Two times a day (BID) | ORAL | Status: DC
  Administered 2023-07-29 – 2023-08-05 (×15): 50 mg via ORAL
  Filled 2023-07-29 (×2): qty 2
  Filled 2023-07-29: qty 1
  Filled 2023-07-29: qty 2
  Filled 2023-07-29 (×6): qty 1
  Filled 2023-07-29: qty 2
  Filled 2023-07-29 (×2): qty 1
  Filled 2023-07-29 (×2): qty 2
  Filled 2023-07-29 (×2): qty 1

## 2023-07-29 MED ORDER — ACETAMINOPHEN 500 MG PO TABS
1000.0000 mg | ORAL_TABLET | Freq: Three times a day (TID) | ORAL | Status: DC
  Administered 2023-07-29 – 2023-08-02 (×13): 1000 mg via ORAL
  Filled 2023-07-29 (×15): qty 2

## 2023-07-29 MED ORDER — DIPHENHYDRAMINE HCL 25 MG PO CAPS
25.0000 mg | ORAL_CAPSULE | Freq: Once | ORAL | Status: AC | PRN
Start: 2023-07-29 — End: 2023-07-29
  Administered 2023-07-29: 25 mg via ORAL
  Filled 2023-07-29: qty 1

## 2023-07-29 MED ORDER — IBUPROFEN 400 MG PO TABS
400.0000 mg | ORAL_TABLET | Freq: Two times a day (BID) | ORAL | Status: DC | PRN
  Administered 2023-07-29: 400 mg via ORAL
  Filled 2023-07-29: qty 1

## 2023-07-29 MED ORDER — LIDOCAINE 5 % EX PTCH
1.0000 | MEDICATED_PATCH | CUTANEOUS | Status: DC
  Administered 2023-07-29 – 2023-08-04 (×7): 1 via TRANSDERMAL
  Filled 2023-07-29 (×8): qty 1

## 2023-07-29 MED ORDER — POTASSIUM CHLORIDE CRYS ER 20 MEQ PO TBCR
30.0000 meq | EXTENDED_RELEASE_TABLET | Freq: Once | ORAL | Status: AC
  Administered 2023-07-29: 30 meq via ORAL
  Filled 2023-07-29: qty 1

## 2023-07-29 MED ORDER — LOPERAMIDE HCL 2 MG PO CAPS: 2.0000 mg | ORAL_CAPSULE | ORAL | Status: DC | PRN
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
## 2023-07-29 NOTE — Progress Notes (Signed)
 PHARMACY - PHYSICIAN COMMUNICATION CRITICAL VALUE ALERT - BLOOD CULTURE IDENTIFICATION (BCID)  Tiffany [name] is an 63 y.o. female who presented to [facility] on 07/27/2023 with a chief complaint of acute encephalopathy and hypotension, recent fall.  Assessment:  1/4 BCx with GPC, likely contaminant  Name of physician (or Provider) Contacted: Dr. Yetta Blanch  Current antibiotics: none  Changes to prescribed antibiotics recommended:  No antibiotics recommended at this time  Results for orders placed or performed during the hospital encounter of 07/27/23  Blood Culture ID Panel (Reflexed) (Collected: 07/28/2023 12:08 PM)  Result Value Ref Range   Enterococcus faecalis NOT DETECTED NOT DETECTED   Enterococcus Faecium NOT DETECTED NOT DETECTED   Listeria monocytogenes NOT DETECTED NOT DETECTED   Staphylococcus species DETECTED (A) NOT DETECTED   Staphylococcus aureus (BCID) NOT DETECTED NOT DETECTED   Staphylococcus epidermidis NOT DETECTED NOT DETECTED   Staphylococcus lugdunensis NOT DETECTED NOT DETECTED   Streptococcus species NOT DETECTED NOT DETECTED   Streptococcus agalactiae NOT DETECTED NOT DETECTED   Streptococcus pneumoniae NOT DETECTED NOT DETECTED   Streptococcus pyogenes NOT DETECTED NOT DETECTED   A.calcoaceticus-baumannii NOT DETECTED NOT DETECTED   Bacteroides fragilis NOT DETECTED NOT DETECTED   Enterobacterales NOT DETECTED NOT DETECTED   Enterobacter cloacae complex NOT DETECTED NOT DETECTED   Escherichia coli NOT DETECTED NOT DETECTED   Klebsiella aerogenes NOT DETECTED NOT DETECTED   Klebsiella oxytoca NOT DETECTED NOT DETECTED   Klebsiella pneumoniae NOT DETECTED NOT DETECTED   Proteus species NOT DETECTED NOT DETECTED   Salmonella species NOT DETECTED NOT DETECTED   Serratia marcescens NOT DETECTED NOT DETECTED   Haemophilus influenzae NOT DETECTED NOT DETECTED   Neisseria meningitidis NOT DETECTED NOT DETECTED   Pseudomonas aeruginosa NOT DETECTED NOT  DETECTED   Stenotrophomonas maltophilia NOT DETECTED NOT DETECTED   Candida albicans NOT DETECTED NOT DETECTED   Candida auris NOT DETECTED NOT DETECTED   Candida glabrata NOT DETECTED NOT DETECTED   Candida krusei NOT DETECTED NOT DETECTED   Candida parapsilosis NOT DETECTED NOT DETECTED   Candida tropicalis NOT DETECTED NOT DETECTED   Cryptococcus neoformans/gattii NOT DETECTED NOT DETECTED    Carmesha Morocco, Suzen Acre 07/29/2023  8:01 AM

## 2023-07-29 NOTE — Progress Notes (Signed)
 Spent extensive time with this pt today [id number] belongings. She requested security come to room to hand things over directly.  Spoke to security twice today and both officers stated their process requires belongings to be gathered and accounted for by floor staff and then delivered to them. She was reluctant but eventually asked that we move forward.  This RN sat at bedside for about 30 minutes, secured belongings with pt accounting for everything via signed, sealed envelope. I explained to her that after security signs off on receiving the items they will retain a copy of the form, one will go on her chart and one will go to her.  She wanted a copy before sending to security. I offered maybe she take a photo with her phone and she'll get the hard copy after security signs.  She refused.  Currently, all belongings still in pt room in sealed envelopes.

## 2023-07-29 NOTE — Progress Notes (Signed)
 Pt in 4E18 Tiffany [name] MRN: [id number], states that she is missing cash that she is certain she brought to hospital with her.  She withdrew the money on Friday 7/11, states she had 2 or 3 $50 bills and the rest was mostly in $20s and smaller bills.  Came into the ED later that night.  Also concerned that she is missing a check stub that was in a separate envelope that had personal account information on it.  There is nothing charted on her belongings upon admission, but she said the first time she was separated from her bags was on 4E (they were in a chair across the room) and she feels that these items were taken from her room.  [name] a xxx pt, hasn't had any visitors.  Security called but no answer as of yet, safety zone completed and grievance dept notified via email.

## 2023-07-29 NOTE — Progress Notes (Signed)
 [facility] night cross cover note:  I ordered a one-time prn dose of po benadryl  for pruritus for this patient.     Eva Pore, DO Hospitalist

## 2023-07-29 NOTE — Progress Notes (Signed)
 [facility] Progress Note   Subjective:   Calm this AM, c/o ongoing L sided CP after a fall.  HD yesterday - machine issues and patient requesting to come  off early.  Ran 2hrs , refused post weight.  UF 0.3L.   Objective Vitals:   07/28/23 1810 07/28/23 1929 07/28/23 2309 07/29/23 0311  BP: (!) 146/81 126/78 120/64 104/66  Pulse: 81 71 83 71  Resp: (!) 21 19 20 18   Temp: 97.8 F (36.6 C) 98.2 F (36.8 C) 98.6 F (37 C) 98.2 F (36.8 C)  TempSrc:  Oral Oral Oral  SpO2: 100% 93% 93% 96%   Physical Exam Gen: awake and alert, appears at her baseline  Eyes: EOMI, glasses ENT: edentulous Neck: supple, RIJ TDC c/d/i CV: RRR Abd:  soft Lungs: normal WOB on RA Extr: trace LE edema Neuro: nonfocal, oriented but tangential and poor historian Skin: some bruising noted on the LE    Additional Objective Labs: Basic Metabolic Panel: Recent Labs  Lab 07/27/23 2350 07/28/23 0744  NA 134*  --   K 2.6*  --   CL 92*  --   CO2 26  --   GLUCOSE 87  --   BUN 40*  --   CREATININE 6.53* 6.91*  CALCIUM  9.2  --    Liver Function Tests: No results for input(s): AST, ALT, ALKPHOS, BILITOT, PROT, ALBUMIN  in the last 168 hours. No results for input(s): LIPASE, AMYLASE in the last 168 hours. CBC: Recent Labs  Lab 07/27/23 2350 07/28/23 0744  WBC 13.2* 9.6  HGB 7.2* 7.3*  HCT 23.8* 23.5*  MCV 95.2 94.8  PLT 296 265   Blood Culture    Component Value Date/Time   SDES BLOOD RIGHT ARM 07/28/2023 1208   SPECREQUEST  07/28/2023 1208    BOTTLES DRAWN AEROBIC AND ANAEROBIC Blood Culture adequate volume   CULT GRAM POSITIVE COCCI 07/28/2023 1208   REPTSTATUS PENDING 07/28/2023 1208    Cardiac Enzymes: No results for input(s): CKTOTAL, CKMB, CKMBINDEX, TROPONINI in the last 168 hours. CBG: No results for input(s): GLUCAP in the last 168 hours. Iron  Studies: No results for input(s): IRON , TIBC, TRANSFERRIN, FERRITIN in the last  72 hours. @lablastinr3 @ Studies/Results: DG Chest 2 View Result Date: 07/27/2023 CLINICAL DATA:  Chest pain EXAM: CHEST - 2 VIEW COMPARISON:  07/12/2023 FINDINGS: Lung volumes are small, but are stable since prior examination. Lungs are clear. No pneumothorax or pleural effusion. Right internal jugular hemodialysis catheter is stable, tip within the superior right atrium. Stable cardiomegaly. Pulmonary vascularity is unchanged with central pulmonary vascular congestion without superimposed overt pulmonary edema. No acute bone abnormality. IMPRESSION: 1. Stable cardiomegaly with central pulmonary vascular congestion. 2. Stable pulmonary hypoinflation. Electronically Signed   By: Dorethia Molt M.D.   On: 07/27/2023 23:35   Medications:   asenapine   10 mg Sublingual QHS   atorvastatin   20 mg Oral Daily   budesonide -glycopyrrolate -formoterol   2 puff Inhalation BID   calcitRIOL   0.5 mcg Oral Daily   Chlorhexidine  Gluconate Cloth  6 each Topical Q0600   dicyclomine   10 mg Oral TID   DULoxetine   30 mg Oral Daily   escitalopram   10 mg Oral Daily   ezetimibe   10 mg Oral Daily   heparin   5,000 Units Subcutaneous Q8H   mirtazapine   15 mg Oral QHS   pantoprazole   40 mg Oral Daily    OUtpt HD:  TTS [facility] 4hrs EDW 82kg BFR 500 DFR 1.5 2K/2Ca No heparin , ESA,  VDRA per orders.   Assessment/Plan   **CP s/p fall:  sounds mechanical, no concern for ACS, per primary.    **Hypotension:  cont midodrine , BP acceptable.   **ESRD on HD: usual schedule TTS, had HD Sat but signed off after 2h.  Check AM labs but hopefully next HD Tues per usual schedule.  Thankfully she's going to [facility] with some more regularity now   **Anemia:  Hb baseline recently 7s, no e/o active bleeding.  Iron  fine 6/14. Dosed with ESA.    **BMM:  continue outpt binder, doubt she takes regularly. On calcitriol .   **Hypokalemia: used 4K dialysate.  AM labs pending, can supplement gently if still < 3.5   Will follow  Manuelita Barters MD 07/29/2023, 9:19 AM  [facility] Pager: [phone]

## 2023-07-29 NOTE — Care Management Obs Status (Signed)
 MEDICARE OBSERVATION STATUS NOTIFICATION   Patient Details  Name: KAVINA CANTAVE MRN: [id number] Date of Birth: [birth year]   Medicare Observation Status Notification Given:  Yes    Charlann Rayfield Hurst, RN 07/29/2023, 4:29 PM
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
## 2023-07-30 DIAGNOSIS — D631 Anemia in chronic kidney disease: Secondary | ICD-10-CM | POA: Diagnosis present

## 2023-07-30 DIAGNOSIS — I132 Hypertensive heart and chronic kidney disease with heart failure and with stage 5 chronic kidney disease, or end stage renal disease: Secondary | ICD-10-CM | POA: Diagnosis present

## 2023-07-30 DIAGNOSIS — E876 Hypokalemia: Secondary | ICD-10-CM | POA: Diagnosis present

## 2023-07-30 DIAGNOSIS — B958 Unspecified staphylococcus as the cause of diseases classified elsewhere: Secondary | ICD-10-CM | POA: Diagnosis present

## 2023-07-30 DIAGNOSIS — E785 Hyperlipidemia, unspecified: Secondary | ICD-10-CM | POA: Diagnosis present

## 2023-07-30 DIAGNOSIS — I959 Hypotension, unspecified: Secondary | ICD-10-CM | POA: Diagnosis present

## 2023-07-30 DIAGNOSIS — Z794 Long term (current) use of insulin: Secondary | ICD-10-CM | POA: Diagnosis not present

## 2023-07-30 DIAGNOSIS — N186 End stage renal disease: Secondary | ICD-10-CM | POA: Diagnosis present

## 2023-07-30 DIAGNOSIS — Z87891 Personal history of nicotine dependence: Secondary | ICD-10-CM | POA: Diagnosis not present

## 2023-07-30 DIAGNOSIS — I5032 Chronic diastolic (congestive) heart failure: Secondary | ICD-10-CM | POA: Diagnosis present

## 2023-07-30 DIAGNOSIS — F200 Paranoid schizophrenia: Secondary | ICD-10-CM | POA: Diagnosis present

## 2023-07-30 DIAGNOSIS — N390 Urinary tract infection, site not specified: Secondary | ICD-10-CM | POA: Diagnosis present

## 2023-07-30 DIAGNOSIS — K219 Gastro-esophageal reflux disease without esophagitis: Secondary | ICD-10-CM | POA: Diagnosis present

## 2023-07-30 DIAGNOSIS — E861 Hypovolemia: Secondary | ICD-10-CM | POA: Diagnosis not present

## 2023-07-30 DIAGNOSIS — G40909 Epilepsy, unspecified, not intractable, without status epilepticus: Secondary | ICD-10-CM | POA: Diagnosis present

## 2023-07-30 DIAGNOSIS — Z992 Dependence on renal dialysis: Secondary | ICD-10-CM | POA: Diagnosis not present

## 2023-07-30 DIAGNOSIS — E1122 Type 2 diabetes mellitus with diabetic chronic kidney disease: Secondary | ICD-10-CM | POA: Diagnosis present

## 2023-07-30 DIAGNOSIS — J9601 Acute respiratory failure with hypoxia: Secondary | ICD-10-CM | POA: Diagnosis present

## 2023-07-30 DIAGNOSIS — Z8249 Family history of ischemic heart disease and other diseases of the circulatory system: Secondary | ICD-10-CM | POA: Diagnosis not present

## 2023-07-30 DIAGNOSIS — J439 Emphysema, unspecified: Secondary | ICD-10-CM | POA: Diagnosis present

## 2023-07-30 DIAGNOSIS — X58XXXS Exposure to other specified factors, sequela: Secondary | ICD-10-CM | POA: Diagnosis present

## 2023-07-30 DIAGNOSIS — G928 Other toxic encephalopathy: Secondary | ICD-10-CM | POA: Diagnosis present

## 2023-07-30 DIAGNOSIS — G894 Chronic pain syndrome: Secondary | ICD-10-CM | POA: Diagnosis present

## 2023-07-30 DIAGNOSIS — D649 Anemia, unspecified: Secondary | ICD-10-CM | POA: Diagnosis not present

## 2023-07-30 DIAGNOSIS — Z882 Allergy status to sulfonamides status: Secondary | ICD-10-CM | POA: Diagnosis not present

## 2023-07-30 DIAGNOSIS — J4489 Other specified chronic obstructive pulmonary disease: Secondary | ICD-10-CM | POA: Diagnosis present

## 2023-07-30 DIAGNOSIS — R0789 Other chest pain: Secondary | ICD-10-CM | POA: Diagnosis not present

## 2023-07-30 DIAGNOSIS — E1165 Type 2 diabetes mellitus with hyperglycemia: Secondary | ICD-10-CM | POA: Diagnosis not present

## 2023-07-30 LAB — CBC
HCT: 23.2 % — ABNORMAL LOW (ref 36.0–46.0)
Hemoglobin: 7.1 g/dL — ABNORMAL LOW (ref 12.0–15.0)
MCH: 29 pg (ref 26.0–34.0)
MCHC: 30.6 g/dL (ref 30.0–36.0)
MCV: 94.7 fL (ref 80.0–100.0)
Platelets: 303 K/uL (ref 150–400)
RBC: 2.45 MIL/uL — ABNORMAL LOW (ref 3.87–5.11)
RDW: 14.6 % (ref 11.5–15.5)
WBC: 5.3 K/uL (ref 4.0–10.5)
nRBC: 0 % (ref 0.0–0.2)

## 2023-07-30 LAB — CULTURE, BLOOD (ROUTINE X 2): Special Requests: ADEQUATE

## 2023-07-30 LAB — RENAL FUNCTION PANEL
Albumin: 2.8 g/dL — ABNORMAL LOW (ref 3.5–5.0)
Anion gap: 11 (ref 5–15)
BUN: 33 mg/dL — ABNORMAL HIGH (ref 8–23)
CO2: 25 mmol/L (ref 22–32)
Calcium: 8.8 mg/dL — ABNORMAL LOW (ref 8.9–10.3)
Chloride: 98 mmol/L (ref 98–111)
Creatinine, Ser: 5.15 mg/dL — ABNORMAL HIGH (ref 0.44–1.00)
GFR, Estimated: 9 mL/min — ABNORMAL LOW (ref >60)
Glucose, Bld: 101 mg/dL — ABNORMAL HIGH (ref 70–99)
Phosphorus: 4.6 mg/dL (ref 2.5–4.6)
Potassium: 3.5 mmol/L (ref 3.5–5.1)
Sodium: 134 mmol/L — ABNORMAL LOW (ref 135–145)

## 2023-07-30 LAB — URINALYSIS, W/ REFLEX TO CULTURE (INFECTION SUSPECTED)
Bilirubin Urine: NEGATIVE
Glucose, UA: NEGATIVE mg/dL
Ketones, ur: NEGATIVE mg/dL
Nitrite: NEGATIVE
Protein, ur: 100 mg/dL — AB
Specific Gravity, Urine: 1.012 (ref 1.005–1.030)
pH: 5 (ref 5.0–8.0)

## 2023-07-30 LAB — RAPID URINE DRUG SCREEN, HOSP PERFORMED
Amphetamines: NOT DETECTED
Barbiturates: NOT DETECTED
Benzodiazepines: NOT DETECTED
Cocaine: NOT DETECTED
Opiates: NOT DETECTED
Tetrahydrocannabinol: NOT DETECTED

## 2023-07-30 LAB — MRSA NEXT GEN BY PCR, NASAL: MRSA by PCR Next Gen: NOT DETECTED

## 2023-07-30 MED ORDER — TORSEMIDE 20 MG PO TABS
40.0000 mg | ORAL_TABLET | Freq: Two times a day (BID) | ORAL | Status: DC
  Administered 2023-07-30 – 2023-08-05 (×11): 40 mg via ORAL
  Filled 2023-07-30 (×11): qty 2

## 2023-07-30 MED ORDER — HYDROXYZINE HCL 25 MG PO TABS
25.0000 mg | ORAL_TABLET | Freq: Three times a day (TID) | ORAL | Status: DC | PRN
  Administered 2023-07-30 – 2023-08-01 (×3): 25 mg via ORAL
  Filled 2023-07-30 (×3): qty 1

## 2023-07-30 MED ORDER — TORSEMIDE 20 MG PO TABS: 40.0000 mg | ORAL_TABLET | Freq: Every day | ORAL | Status: DC

## 2023-07-30 MED ORDER — GUAIFENESIN-DM 100-10 MG/5ML PO SYRP
5.0000 mL | ORAL_SOLUTION | ORAL | Status: DC | PRN
  Administered 2023-07-30: 5 mL via ORAL
  Filled 2023-07-30: qty 5

## 2023-07-30 MED ORDER — SODIUM CHLORIDE 0.9 % IV SOLN
1.0000 g | INTRAVENOUS | Status: DC
  Administered 2023-07-30: 1 g via INTRAVENOUS
  Filled 2023-07-30 (×2): qty 10

## 2023-07-30 MED ORDER — CHLORHEXIDINE GLUCONATE CLOTH 2 % EX PADS
6.0000 | MEDICATED_PAD | Freq: Every day | CUTANEOUS | Status: DC
Start: 2023-07-31 — End: 2023-08-03
  Administered 2023-08-01 – 2023-08-02 (×2): 6 via TOPICAL

## 2023-07-30 MED ORDER — CEPHALEXIN 500 MG PO CAPS
500.0000 mg | ORAL_CAPSULE | Freq: Every day | ORAL | Status: AC
  Administered 2023-07-31 – 2023-08-01 (×2): 500 mg via ORAL
  Filled 2023-07-30 (×3): qty 1

## 2023-07-30 MED ORDER — POTASSIUM CHLORIDE CRYS ER 10 MEQ PO TBCR
10.0000 meq | EXTENDED_RELEASE_TABLET | Freq: Every day | ORAL | Status: DC
  Administered 2023-07-30 – 2023-08-03 (×4): 10 meq via ORAL
  Filled 2023-07-30 (×5): qty 1

## 2023-07-30 MED ORDER — DARBEPOETIN ALFA 100 MCG/0.5ML IJ SOSY: 100.0000 ug | PREFILLED_SYRINGE | INTRAMUSCULAR | Status: DC
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
## 2023-07-30 NOTE — Progress Notes (Signed)
 [facility] night cross cover note:   I was notified by the patient's RN that the results from UA ordered on 7/12 and collected this evening are now available.   Per my review, urinalysis collected this evening is a cloudy appearing specimen associated with 21-50 white blood cells, large leukocyte esterase.  In the setting of significant pyuria and the patient's initial presenting altered mental status, suspect that this clinical picture is consistent with acute cystitis.  Reflex urine culture has been initiated from the sample.  Most recent vital signs notable for the following: Afebrile, heart rates in the 70s, systolic blood pressures in the 130s, respiratory rate 17-19, and oxygen  saturation 97 to 98% on room air.  CBC drawn this morning includes white blood cell count of 5300.  In the absence of objective fever and in the absence of leukocytosis, SIRS criteria not currently met for sepsis.   I have ordered Rocephin  for empiric coverage of suspected underlying acute cystitis.    Eva Pore, DO Hospitalist

## 2023-07-30 NOTE — Progress Notes (Signed)
SANE RN at bedside for assessment. ?

## 2023-07-30 NOTE — Progress Notes (Signed)
 4 patient valuable enveolpes sent to security to be stored in safe

## 2023-07-30 NOTE — Progress Notes (Signed)
 [facility] Progress Note   Subjective:    She had 300 mL UOP over 7/13 charted.  She signed off early last HD and refused a post weight.  Spoke with primary team - she will be here until at least tomorrow given social/dispo issues but is otherwise medically cleared to go home.     Review of systems:  Reports shortness of breath which has improved  Denies n/v    Objective Vitals:   07/29/23 2254 07/30/23 0352 07/30/23 0729 07/30/23 1110  BP: 109/73 (!) 137/95 111/75 (!) 141/88  Pulse: 74 73 71 81  Resp: 19 17 16 16   Temp: 98.2 F (36.8 C) 97.9 F (36.6 C) 98.2 F (36.8 C) 98 F (36.7 C)  TempSrc: Oral Oral Oral Oral  SpO2: 97% 98% 95% 93%   Physical Exam  General adult female in bed in no acute distress HEENT normocephalic atraumatic extraocular movements intact sclera anicteric Neck supple trachea midline Lungs clear to auscultation bilaterally normal work of breathing at rest  Heart S1S2 no rub Abdomen soft nontender nondistended Extremities no edema  Psych normal mood and affect Neuro - conversant and follows commands; at times poor historian  Access: RIJ tunneled catheter in place      Additional Objective Labs: Basic Metabolic Panel: Recent Labs  Lab 07/27/23 2350 07/28/23 0744 07/29/23 1032 07/30/23 0403  NA 134*  --  133* 134*  K 2.6*  --  3.0* 3.5  CL 92*  --  96* 98  CO2 26  --  24 25  GLUCOSE 87  --  89 101*  BUN 40*  --  29* 33*  CREATININE 6.53* 6.91* 4.63* 5.15*  CALCIUM  9.2  --  8.6* 8.8*  PHOS  --   --   --  4.6   Liver Function Tests: Recent Labs  Lab 07/30/23 0403  ALBUMIN  2.8*   No results for input(s): LIPASE, AMYLASE in the last 168 hours. CBC: Recent Labs  Lab 07/27/23 2350 07/28/23 0744 07/29/23 1032 07/29/23 1728 07/30/23 0403  WBC 13.2* 9.6 6.4 5.9 5.3  NEUTROABS  --   --   --  4.1  --   HGB 7.2* 7.3* 7.0* 7.0* 7.1*  HCT 23.8* 23.5* 22.8* 22.4* 23.2*  MCV 95.2 94.8 94.2 93.7 94.7  PLT 296 265 315  331 303   Blood Culture    Component Value Date/Time   SDES BLOOD RIGHT ARM 07/28/2023 1208   SPECREQUEST  07/28/2023 1208    BOTTLES DRAWN AEROBIC AND ANAEROBIC Blood Culture adequate volume   CULT (A) 07/28/2023 1208    STAPHYLOCOCCUS HAEMOLYTICUS THE SIGNIFICANCE OF ISOLATING THIS ORGANISM FROM A SINGLE SET OF BLOOD CULTURES WHEN MULTIPLE SETS ARE DRAWN IS UNCERTAIN. PLEASE NOTIFY THE MICROBIOLOGY DEPARTMENT WITHIN ONE WEEK IF SPECIATION AND SENSITIVITIES ARE REQUIRED. Performed at [facility], [address], [place], [place] [address]    REPTSTATUS 07/30/2023 FINAL 07/28/2023 1208    Cardiac Enzymes: No results for input(s): CKTOTAL, CKMB, CKMBINDEX, TROPONINI in the last 168 hours. CBG: No results for input(s): GLUCAP in the last 168 hours. Iron  Studies:  Recent Labs    07/29/23 1728  IRON  86  TIBC 217*  FERRITIN 687*   @lablastinr3 @ Studies/Results: CT HEAD WO CONTRAST ( ) Result Date: 07/29/2023 CLINICAL DATA:  Head trauma, abnormal mental status (Age 16-64y) EXAM: CT HEAD WITHOUT CONTRAST TECHNIQUE: Contiguous axial images were obtained from the base of the skull through the vertex without intravenous contrast. RADIATION DOSE REDUCTION: This  exam was performed according to the departmental dose-optimization program which includes automated exposure control, adjustment of the mA and/or kV according to patient size and/or use of iterative reconstruction technique. COMPARISON:  CT head February 22, 25. FINDINGS: Brain: No evidence of acute infarction, hemorrhage, hydrocephalus, extra-axial collection or mass lesion/mass effect. Vascular: No hyperdense vessel or unexpected calcification. Skull: No acute fracture. Sinuses/Orbits: No acute finding. Other: No mastoid effusions. IMPRESSION: No evidence of acute intracranial abnormality. Electronically Signed   By: Gilmore GORMAN Molt M.D.   On: 07/29/2023 22:01   CT CHEST WO CONTRAST Result Date:  07/29/2023 CLINICAL DATA:  History of recent fall with left-sided chest pain, initial encounter EXAM: CT CHEST WITHOUT CONTRAST TECHNIQUE: Multidetector CT imaging of the chest was performed following the standard protocol without IV contrast. RADIATION DOSE REDUCTION: This exam was performed according to the departmental dose-optimization program which includes automated exposure control, adjustment of the mA and/or kV according to patient size and/or use of iterative reconstruction technique. COMPARISON:  Plain film from 07/27/2023, CT from 02/15/2023 FINDINGS: Cardiovascular: Somewhat limited due to lack of IV contrast. Minimal atherosclerotic calcifications of the aorta are noted. No aneurysmal dilatation is seen. Heart is mildly enlarged in size. Dialysis catheter is noted in satisfactory position. Mediastinum/Nodes: Thoracic inlet is within normal limits. No hilar or mediastinal adenopathy is noted. The esophagus as visualized is within normal limits. Lungs/Pleura: The lungs are well aerated bilaterally. No sizable parenchymal nodule is noted. No effusion or focal confluent infiltrate is noted. Mild left basilar atelectasis is seen. Upper Abdomen: Visualized upper abdomen demonstrates multiple gallstones without complicating factors. Musculoskeletal: Old healed rib deformities are noted on the left. No acute rib fracture is noted. IMPRESSION: Mild left basilar atelectasis. Cholelithiasis without complicating factors. No acute bony abnormality to correspond with the recent injury. Old left rib fractures are noted. Aortic Atherosclerosis (ICD10-I70.0). Electronically Signed   By: Oneil Devonshire M.D.   On: 07/29/2023 21:21   CT CERVICAL SPINE WO CONTRAST Result Date: 07/29/2023 CLINICAL DATA:  Ataxia, cervical trauma EXAM: CT CERVICAL SPINE WITHOUT CONTRAST TECHNIQUE: Multidetector CT imaging of the cervical spine was performed without intravenous contrast. Multiplanar CT image reconstructions were also  generated. RADIATION DOSE REDUCTION: This exam was performed according to the departmental dose-optimization program which includes automated exposure control, adjustment of the mA and/or kV according to patient size and/or use of iterative reconstruction technique. COMPARISON:  CT cervical spine 06/21/2022 FINDINGS: Alignment: No evidence of traumatic listhesis. Skull base and vertebrae: No acute fracture. Soft tissues and spinal canal: No prevertebral fluid or swelling. No visible canal hematoma. Disc levels: Multilevel spondylosis, disc space height loss, and degenerative endplate changes. There is ankylosis at C3-C4. Moderate to advanced degenerative disc disease at C5-C6. Advanced facet arthropathy on the left with cortical erosions C4-C5. No associated fluid collection. No severe spinal canal narrowing. Upper chest: No acute abnormality. Other: Partially visualized right CVC. IMPRESSION: 1. No acute fracture in the cervical spine. 2. Advanced facet arthropathy with cortical erosions on the left at C4-C5. If there is concern for infectious or inflammatory arthropathy consider MRI for further evaluation Electronically Signed   By: Norman Gatlin M.D.   On: 07/29/2023 21:11   Medications:  cefTRIAXone  (ROCEPHIN )  IV Stopped (07/30/23 0438)    acetaminophen   1,000 mg Oral Q8H   atorvastatin   20 mg Oral Daily   budesonide -glycopyrrolate -formoterol   2 puff Inhalation BID   calcitRIOL   0.5 mcg Oral Daily   Chlorhexidine  Gluconate Cloth  6 each Topical  Q0600   dicyclomine   10 mg Oral TID   DULoxetine   30 mg Oral Daily   escitalopram   10 mg Oral Daily   ezetimibe   10 mg Oral Daily   gabapentin   100 mg Oral QHS   heparin   5,000 Units Subcutaneous Q8H   lamoTRIgine   100 mg Oral QHS   lidocaine   1 patch Transdermal Q24H   mirtazapine   15 mg Oral QHS   pantoprazole   40 mg Oral Daily   QUEtiapine   50 mg Oral BID    OUtpt HD:  TTS [facility] 4hrs EDW 82kg BFR 500 DFR 1.5 2K/2Ca No heparin , ESA, VDRA  per orders.   Assessment/Plan   **CP s/p fall:  sounds mechanical, no concern for ACS, per primary.    **Hypotension:  BP acceptable.  Note that to clarify charting she is not currently on midodrine .  Has anti-HTN's on home med list when haven't previously been filled in the system if filled .   **ESRD on HD:  - usual schedule TTS.   - Plan for HD TTS - Thankfully, she's going to [facility] with some more regularity now - given that she still makes urine I would recommend stopping NSAID's.  Hx of noncompliance with HD and her residual renal function is especially important given the same    **Anemia of CKD:  Hb baseline recently 7s, no e/o active bleeding.  Iron  fine 6/14. Rec'd aranesp  100 mcg on 7/12 - will order for weekly while here and will need to assess dose    **BMM:  continue outpt binder; note concern for noncompliance.  on calcitriol .   **Hypokalemia: used 4K dialysate.  improved   Disposition - per primary team     Katheryn JAYSON Saba, MD 11:45 AM 07/30/2023

## 2023-07-30 NOTE — Progress Notes (Signed)
 The patient said she felt a burning sensation when she voided. Her urine was cloudy and had a strong smell.  Sent a urinalysis that was ordered on 7/12. The results seem to show the patient has a UTI. Informed the on call physician, who ordered IV Rocephin  1 g every 24 hours.  Will continue to monitor.  Kristene Sotero BRAVO, RN

## 2023-07-31 DIAGNOSIS — E861 Hypovolemia: Secondary | ICD-10-CM | POA: Diagnosis not present

## 2023-07-31 LAB — RENAL FUNCTION PANEL
Albumin: 3 g/dL — ABNORMAL LOW (ref 3.5–5.0)
Anion gap: 16 — ABNORMAL HIGH (ref 5–15)
BUN: 53 mg/dL — ABNORMAL HIGH (ref 8–23)
CO2: 23 mmol/L (ref 22–32)
Calcium: 9.3 mg/dL (ref 8.9–10.3)
Chloride: 99 mmol/L (ref 98–111)
Creatinine, Ser: 6.25 mg/dL — ABNORMAL HIGH (ref 0.44–1.00)
GFR, Estimated: 7 mL/min — ABNORMAL LOW (ref >60)
Glucose, Bld: 90 mg/dL (ref 70–99)
Phosphorus: 4.5 mg/dL (ref 2.5–4.6)
Potassium: 4 mmol/L (ref 3.5–5.1)
Sodium: 138 mmol/L (ref 135–145)

## 2023-07-31 LAB — CBC
HCT: 23.4 % — ABNORMAL LOW (ref 36.0–46.0)
Hemoglobin: 7.2 g/dL — ABNORMAL LOW (ref 12.0–15.0)
MCH: 29 pg (ref 26.0–34.0)
MCHC: 30.8 g/dL (ref 30.0–36.0)
MCV: 94.4 fL (ref 80.0–100.0)
Platelets: 343 K/uL (ref 150–400)
RBC: 2.48 MIL/uL — ABNORMAL LOW (ref 3.87–5.11)
RDW: 14.3 % (ref 11.5–15.5)
WBC: 6.1 K/uL (ref 4.0–10.5)
nRBC: 0 % (ref 0.0–0.2)

## 2023-07-31 LAB — HEPATITIS B SURFACE ANTIBODY, QUANTITATIVE: Hep B S AB Quant (Post): 2775 m[IU]/mL

## 2023-07-31 MED ORDER — MIDODRINE HCL 5 MG PO TABS: 5.0000 mg | ORAL_TABLET | Freq: Every day | ORAL | Status: DC | PRN

## 2023-07-31 MED ORDER — HEPARIN SODIUM (PORCINE) 1000 UNIT/ML IJ SOLN
INTRAMUSCULAR | Status: AC
  Filled 2023-07-31: qty 4

## 2023-07-31 MED ORDER — HEPARIN SODIUM (PORCINE) 1000 UNIT/ML IJ SOLN
1000.0000 [IU] | Freq: Once | INTRAMUSCULAR | Status: AC
  Administered 2023-07-31: 3200 [IU] via INTRAVENOUS
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
## 2023-07-31 NOTE — Progress Notes (Signed)
 Pt receives out-pt HD at [facility] on TTS. Will assist as needed.   Olivia Canter Renal Navigator [phone]

## 2023-07-31 NOTE — Progress Notes (Signed)
 [facility] Progress Note   Subjective:    She had 600 mL UOP over 7/14 charted.  She signed off early last HD and refused a post weight.  She feels ok today - tired    Review of systems:    Denies shortness of breath or chest pain Denies n/v    Objective Vitals:   07/30/23 2346 07/31/23 0449 07/31/23 0749 07/31/23 0807  BP: (!) 122/103 139/79  (!) 147/87  Pulse: 82 74  83  Resp: 17 18  16   Temp: 97.9 F (36.6 C) 97.6 F (36.4 C)  97.7 F (36.5 C)  TempSrc: Axillary Axillary  Oral  SpO2: 100% 100% 100%    Physical Exam   General adult female in bed in no acute distress HEENT normocephalic atraumatic extraocular movements intact sclera anicteric Neck supple trachea midline Lungs clear to auscultation bilaterally normal work of breathing at rest  Heart S1S2 no rub Abdomen soft nontender nondistended Extremities no edema  Psych normal mood and affect Neuro - awake and conversant and follows commands Access: RIJ tunneled catheter in place      Additional Objective Labs: Basic Metabolic Panel: Recent Labs  Lab 07/27/23 2350 07/28/23 0744 07/29/23 1032 07/30/23 0403  NA 134*  --  133* 134*  K 2.6*  --  3.0* 3.5  CL 92*  --  96* 98  CO2 26  --  24 25  GLUCOSE 87  --  89 101*  BUN 40*  --  29* 33*  CREATININE 6.53* 6.91* 4.63* 5.15*  CALCIUM  9.2  --  8.6* 8.8*  PHOS  --   --   --  4.6   Liver Function Tests: Recent Labs  Lab 07/30/23 0403  ALBUMIN  2.8*   No results for input(s): LIPASE, AMYLASE in the last 168 hours. CBC: Recent Labs  Lab 07/27/23 2350 07/28/23 0744 07/29/23 1032 07/29/23 1728 07/30/23 0403  WBC 13.2* 9.6 6.4 5.9 5.3  NEUTROABS  --   --   --  4.1  --   HGB 7.2* 7.3* 7.0* 7.0* 7.1*  HCT 23.8* 23.5* 22.8* 22.4* 23.2*  MCV 95.2 94.8 94.2 93.7 94.7  PLT 296 265 315 331 303   Blood Culture    Component Value Date/Time   SDES BLOOD RIGHT ARM 07/28/2023 1208   SPECREQUEST  07/28/2023 1208    BOTTLES DRAWN AEROBIC  AND ANAEROBIC Blood Culture adequate volume   CULT (A) 07/28/2023 1208    STAPHYLOCOCCUS HAEMOLYTICUS THE SIGNIFICANCE OF ISOLATING THIS ORGANISM FROM A SINGLE SET OF BLOOD CULTURES WHEN MULTIPLE SETS ARE DRAWN IS UNCERTAIN. PLEASE NOTIFY THE MICROBIOLOGY DEPARTMENT WITHIN ONE WEEK IF SPECIATION AND SENSITIVITIES ARE REQUIRED. Performed at [facility], [address] [place], [place] [address]    REPTSTATUS 07/30/2023 FINAL 07/28/2023 1208    Cardiac Enzymes: No results for input(s): CKTOTAL, CKMB, CKMBINDEX, TROPONINI in the last 168 hours. CBG: No results for input(s): GLUCAP in the last 168 hours. Iron  Studies:  Recent Labs    07/29/23 1728  IRON  86  TIBC 217*  FERRITIN 687*   @lablastinr3 @ Studies/Results: CT HEAD WO CONTRAST ( ) Result Date: 07/29/2023 CLINICAL DATA:  Head trauma, abnormal mental status (Age 22-64y) EXAM: CT HEAD WITHOUT CONTRAST TECHNIQUE: Contiguous axial images were obtained from the base of the skull through the vertex without intravenous contrast. RADIATION DOSE REDUCTION: This exam was performed according to the departmental dose-optimization program which includes automated exposure control, adjustment of the mA and/or kV according to patient  size and/or use of iterative reconstruction technique. COMPARISON:  CT head February 22, 25. FINDINGS: Brain: No evidence of acute infarction, hemorrhage, hydrocephalus, extra-axial collection or mass lesion/mass effect. Vascular: No hyperdense vessel or unexpected calcification. Skull: No acute fracture. Sinuses/Orbits: No acute finding. Other: No mastoid effusions. IMPRESSION: No evidence of acute intracranial abnormality. Electronically Signed   By: Gilmore GORMAN Molt M.D.   On: 07/29/2023 22:01   CT CHEST WO CONTRAST Result Date: 07/29/2023 CLINICAL DATA:  History of recent fall with left-sided chest pain, initial encounter EXAM: CT CHEST WITHOUT CONTRAST TECHNIQUE: Multidetector CT imaging of the  chest was performed following the standard protocol without IV contrast. RADIATION DOSE REDUCTION: This exam was performed according to the departmental dose-optimization program which includes automated exposure control, adjustment of the mA and/or kV according to patient size and/or use of iterative reconstruction technique. COMPARISON:  Plain film from 07/27/2023, CT from 02/15/2023 FINDINGS: Cardiovascular: Somewhat limited due to lack of IV contrast. Minimal atherosclerotic calcifications of the aorta are noted. No aneurysmal dilatation is seen. Heart is mildly enlarged in size. Dialysis catheter is noted in satisfactory position. Mediastinum/Nodes: Thoracic inlet is within normal limits. No hilar or mediastinal adenopathy is noted. The esophagus as visualized is within normal limits. Lungs/Pleura: The lungs are well aerated bilaterally. No sizable parenchymal nodule is noted. No effusion or focal confluent infiltrate is noted. Mild left basilar atelectasis is seen. Upper Abdomen: Visualized upper abdomen demonstrates multiple gallstones without complicating factors. Musculoskeletal: Old healed rib deformities are noted on the left. No acute rib fracture is noted. IMPRESSION: Mild left basilar atelectasis. Cholelithiasis without complicating factors. No acute bony abnormality to correspond with the recent injury. Old left rib fractures are noted. Aortic Atherosclerosis (ICD10-I70.0). Electronically Signed   By: Oneil Devonshire M.D.   On: 07/29/2023 21:21   CT CERVICAL SPINE WO CONTRAST Result Date: 07/29/2023 CLINICAL DATA:  Ataxia, cervical trauma EXAM: CT CERVICAL SPINE WITHOUT CONTRAST TECHNIQUE: Multidetector CT imaging of the cervical spine was performed without intravenous contrast. Multiplanar CT image reconstructions were also generated. RADIATION DOSE REDUCTION: This exam was performed according to the departmental dose-optimization program which includes automated exposure control, adjustment of the  mA and/or kV according to patient size and/or use of iterative reconstruction technique. COMPARISON:  CT cervical spine 06/21/2022 FINDINGS: Alignment: No evidence of traumatic listhesis. Skull base and vertebrae: No acute fracture. Soft tissues and spinal canal: No prevertebral fluid or swelling. No visible canal hematoma. Disc levels: Multilevel spondylosis, disc space height loss, and degenerative endplate changes. There is ankylosis at C3-C4. Moderate to advanced degenerative disc disease at C5-C6. Advanced facet arthropathy on the left with cortical erosions C4-C5. No associated fluid collection. No severe spinal canal narrowing. Upper chest: No acute abnormality. Other: Partially visualized right CVC. IMPRESSION: 1. No acute fracture in the cervical spine. 2. Advanced facet arthropathy with cortical erosions on the left at C4-C5. If there is concern for infectious or inflammatory arthropathy consider MRI for further evaluation Electronically Signed   By: Norman Gatlin M.D.   On: 07/29/2023 21:11   Medications:    acetaminophen   1,000 mg Oral Q8H   atorvastatin   20 mg Oral Daily   budesonide -glycopyrrolate -formoterol   2 puff Inhalation BID   calcitRIOL   0.5 mcg Oral Daily   cephALEXin   500 mg Oral Q0600   Chlorhexidine  Gluconate Cloth  6 each Topical Q0600   Chlorhexidine  Gluconate Cloth  6 each Topical Q0600   [START ON 08/04/2023] darbepoetin (ARANESP ) injection - NON-DIALYSIS  100  mcg Subcutaneous Q Fri-1800   dicyclomine   10 mg Oral TID   DULoxetine   30 mg Oral Daily   escitalopram   10 mg Oral Daily   ezetimibe   10 mg Oral Daily   gabapentin   100 mg Oral QHS   heparin   5,000 Units Subcutaneous Q8H   lamoTRIgine   100 mg Oral QHS   lidocaine   1 patch Transdermal Q24H   mirtazapine   15 mg Oral QHS   pantoprazole   40 mg Oral Daily   potassium chloride   10 mEq Oral Daily   QUEtiapine   50 mg Oral BID   torsemide   40 mg Oral BID    OUtpt HD:  TTS [facility] 4hrs EDW 82kg BFR 500 DFR  1.5 2K/2Ca No heparin , ESA, VDRA per orders.   Assessment/Plan   **CP s/p fall:  sounds mechanical, no concern for ACS, per primary.    **Hypotension:  BP acceptable.  Note that to clarify charting she is not currently on midodrine .  Has anti-HTN's on home med list - unsure of compliance    **ESRD on HD:  - HD per TTS schedule  - Thankfully, she's going to [facility] with some more regularity now - given that she still makes urine I would recommend stopping NSAID's.  Hx of noncompliance with HD and her residual renal function is especially important given the same  - ordered renal panel daily while here - will follow-up today's labs   **Anemia of CKD:  Hb baseline recently 7s, no e/o active bleeding.  Iron  fine 6/14. Rec'd aranesp  100 mcg on 7/12.  ordered for weekly on Fridays while here and will need to assess dose    **BMM:  continue outpt binder; note concern for noncompliance.  on calcitriol .   **Hypokalemia: using added K bath.  Improved  **Staph in blood cultures  - felt to likely be a contaminant - Team is ok with repeating blood cultures today.  The culture which had no growth was not of optimal volume.     Disposition - per primary team     Katheryn JAYSON Saba, MD 12:05 PM 07/31/2023
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
## 2023-07-31 NOTE — Plan of Care (Signed)
  Problem: Education: Goal: Knowledge of General Education information will improve Description: Including pain rating scale, medication(s)/side effects and non-pharmacologic comfort measures Outcome: Progressing   Problem: Health Behavior/Discharge Planning: Goal: Ability to manage health-related needs will improve Outcome: Progressing   Problem: Clinical Measurements: Goal: Ability to maintain clinical measurements within normal limits will improve Outcome: Progressing Goal: Will remain free from infection Outcome: Progressing Goal: Cardiovascular complication will be avoided Outcome: Progressing   Problem: Nutrition: Goal: Adequate nutrition will be maintained Outcome: Progressing   Problem: Coping: Goal: Level of anxiety will decrease Outcome: Progressing   

## 2023-07-31 NOTE — Progress Notes (Signed)
 PT Cancellation Note  Patient Details Name: Tiffany [name] MRN: [id number] DOB: 1960/09/24   Cancelled Treatment:    Reason Eval/Treat Not Completed: (P) Pain limiting ability to participate;Patient declined, no reason specified Pt reports pain in her back and that no one knows what's going on here, I'm not getting up today. RN reports pt to go to HD this afternoon.   PT will follow back tomorrow for Evaluation.  Brian Kocourek B. Fleeta Lapidus PT, DPT Acute Rehabilitation Services Please use secure chat or  Call Office [phone]    Tiffany [name] Fleeta [facility] 07/31/2023, 12:50 PM

## 2023-08-01 DIAGNOSIS — D649 Anemia, unspecified: Secondary | ICD-10-CM | POA: Diagnosis not present

## 2023-08-01 DIAGNOSIS — E861 Hypovolemia: Secondary | ICD-10-CM | POA: Diagnosis not present

## 2023-08-01 LAB — HEMOGLOBIN AND HEMATOCRIT, BLOOD
HCT: 22.9 % — ABNORMAL LOW (ref 36.0–46.0)
HCT: 23.9 % — ABNORMAL LOW (ref 36.0–46.0)
Hemoglobin: 7 g/dL — ABNORMAL LOW (ref 12.0–15.0)
Hemoglobin: 7.5 g/dL — ABNORMAL LOW (ref 12.0–15.0)

## 2023-08-01 LAB — RENAL FUNCTION PANEL
Albumin: 2.6 g/dL — ABNORMAL LOW (ref 3.5–5.0)
Anion gap: 15 (ref 5–15)
BUN: 28 mg/dL — ABNORMAL HIGH (ref 8–23)
CO2: 25 mmol/L (ref 22–32)
Calcium: 9.2 mg/dL (ref 8.9–10.3)
Chloride: 97 mmol/L — ABNORMAL LOW (ref 98–111)
Creatinine, Ser: 3.75 mg/dL — ABNORMAL HIGH (ref 0.44–1.00)
GFR, Estimated: 13 mL/min — ABNORMAL LOW (ref >60)
Glucose, Bld: 140 mg/dL — ABNORMAL HIGH (ref 70–99)
Phosphorus: 3.4 mg/dL (ref 2.5–4.6)
Potassium: 3.6 mmol/L (ref 3.5–5.1)
Sodium: 137 mmol/L (ref 135–145)

## 2023-08-01 LAB — CBC
HCT: 22 % — ABNORMAL LOW (ref 36.0–46.0)
Hemoglobin: 6.9 g/dL — CL (ref 12.0–15.0)
MCH: 29.6 pg (ref 26.0–34.0)
MCHC: 31.4 g/dL (ref 30.0–36.0)
MCV: 94.4 fL (ref 80.0–100.0)
Platelets: 264 K/uL (ref 150–400)
RBC: 2.33 MIL/uL — ABNORMAL LOW (ref 3.87–5.11)
RDW: 14.5 % (ref 11.5–15.5)
WBC: 7.3 K/uL (ref 4.0–10.5)
nRBC: 0 % (ref 0.0–0.2)

## 2023-08-01 LAB — PREPARE RBC (CROSSMATCH)

## 2023-08-01 MED ORDER — CHLORHEXIDINE GLUCONATE CLOTH 2 % EX PADS
6.0000 | MEDICATED_PAD | Freq: Every day | CUTANEOUS | Status: DC
  Administered 2023-08-02 – 2023-08-03 (×2): 6 via TOPICAL

## 2023-08-01 MED ORDER — CEPHALEXIN 500 MG PO CAPS
500.0000 mg | ORAL_CAPSULE | Freq: Every day | ORAL | Status: AC
  Administered 2023-08-02 – 2023-08-03 (×2): 500 mg via ORAL
  Filled 2023-08-01 (×2): qty 1

## 2023-08-01 MED ORDER — DIPHENHYDRAMINE HCL 25 MG PO CAPS
25.0000 mg | ORAL_CAPSULE | Freq: Once | ORAL | Status: AC
  Administered 2023-08-01: 25 mg via ORAL
  Filled 2023-08-01: qty 1

## 2023-08-01 MED ORDER — SODIUM CHLORIDE 0.9% IV SOLUTION: Freq: Once | INTRAVENOUS | Status: DC

## 2023-08-01 MED ORDER — DIPHENHYDRAMINE HCL 50 MG/ML IJ SOLN
25.0000 mg | Freq: Four times a day (QID) | INTRAMUSCULAR | Status: DC | PRN
  Administered 2023-08-02: 25 mg via INTRAVENOUS
  Filled 2023-08-01: qty 1

## 2023-08-01 NOTE — Evaluation (Signed)
 Physical Therapy Evaluation Patient Details Name: DEATRA MCMAHEN MRN: [id number] DOB: 30-Aug-1960 Today's Date: 08/01/2023  History of Present Illness  Pt is a 63yo female who came to ED from home presenting with chest pain, diarrhea, and hypotension. Pt poor historian but reports a possible fall resulting in chest pain. PMH: ESRD on HD TTS, chronic diastolic CGF, schizoaffective disorders, anemia, OSA, GERD, neuropathy, anxiety/depression, h/o seizure d/o   Clinical Impression  Pt admitted with above. Pt well known to service. Pt cooperative this date and agreeable to EOB mobility. Pt was able to stand with minA up to RW and took side steps to [facility] however reports bilat LE neuropathy which is why she can't walk. Pt lives home alone and reports having the assist she needs when she needs it. Acute PT to cont to follow while in hospital however I don't anticipate she will need follow up PT upon d/c.        If plan is discharge home, recommend the following: A little help with bathing/dressing/bathroom;Help with stairs or ramp for entrance;Assist for transportation   Can travel by private vehicle        Equipment Recommendations None recommended by PT  Recommendations for Other Services       Functional Status Assessment Patient has had a recent decline in their functional status and demonstrates the ability to make significant improvements in function in a reasonable and predictable amount of time.     Precautions / Restrictions Precautions Precautions: Fall Recall of Precautions/Restrictions: Intact Restrictions Weight Bearing Restrictions Per Provider Order: No      Mobility  Bed Mobility Overal bed mobility: Modified Independent             General bed mobility comments: HOB elevated, pt used bed rails and transferred herself to EOB, pt did request assist from PT to manage LEs to return to supine in bed    Transfers Overall transfer level: Needs assistance Equipment  used: Rolling walker (2 wheels) Transfers: Sit to/from Stand Sit to Stand: Contact guard assist           General transfer comment: minA to power up x 2, pt with posterior bias, wide base of support, pt reports I can't feel my legs. Pt did agree to try to stand a second time in which she then took 5 side steps to [facility] with minA for walker management    Ambulation/Gait               General Gait Details: pt took 5 steps to [facility] with minA for RW management however reports bilat neuropathy stating that she can't walk  Stairs            Wheelchair Mobility     Tilt Bed    Modified Rankin (Stroke Patients Only)       Balance Overall balance assessment: Needs assistance Sitting-balance support: No upper extremity supported, Feet supported Sitting balance-Leahy Scale: Good     Standing balance support: Bilateral upper extremity supported, During functional activity, Reliant on assistive device for balance Standing balance-Leahy Scale: Poor Standing balance comment: reliant on external support                             Pertinent Vitals/Pain Pain Assessment Pain Assessment: Faces Faces Pain Scale: No hurt    Home Living Family/patient expects to be discharged to:: Private residence Living Arrangements: Alone Available Help at Discharge: Family;Available PRN/intermittently Type of Home: House  Home Access: Ramped entrance       Home Layout: One level Home Equipment: BSC/3in1;Hospital bed;Wheelchair - Surveyor, quantity (2 wheels) Additional Comments: denies having an aide, but states she has someone that helps, her; unclear of how many days per week. states they are there when I need them    Prior Function Prior Level of Function : Needs assist             Mobility Comments: pt reports having transportation to/from HD, completes std pvt transfer from bed to electric w/c and states If I don't feel good, I dont transfer ADLs Comments:  pt reports sponge bathing at kitchen sink because she can't get her w/c in the bathroom. reports wearing depends and standing at sink to change them, reports ordering food in and doesn't cook for herself     Extremity/Trunk Assessment   Upper Extremity Assessment Upper Extremity Assessment: Generalized weakness    Lower Extremity Assessment Lower Extremity Assessment: Generalized weakness (bilat LE neuropathy)    Cervical / Trunk Assessment Cervical / Trunk Assessment: Normal  Communication   Communication Communication: No apparent difficulties Factors Affecting Communication: Reduced clarity of speech    Cognition Arousal: Alert Behavior During Therapy: Flat affect   PT - Cognitive impairments: History of cognitive impairments                       PT - Cognition Comments: aware of psych history, pt aware she is receiving blood but agreeable to EOB mobility Following commands: Intact       Cueing Cueing Techniques: Verbal cues     General Comments General comments (skin integrity, edema, etc.): VSS    Exercises General Exercises - Lower Extremity Ankle Circles/Pumps: AROM, Both, 10 reps, Seated (minimal on R foot) Long Arc Quad: AROM, Both, 10 reps, Seated Hip Flexion/Marching: AROM, Both, 10 reps, Seated   Assessment/Plan    PT Assessment Patient needs continued PT services  PT Problem List Decreased strength;Decreased activity tolerance;Decreased balance;Decreased mobility       PT Treatment Interventions DME instruction;Functional mobility training;Therapeutic activities;Therapeutic exercise;Balance training    PT Goals (Current goals can be found in the Care Plan section)  Acute Rehab PT Goals Patient Stated Goal: home PT Goal Formulation: With patient Time For Goal Achievement: 08/08/23 Potential to Achieve Goals: Good    Frequency Min 2X/week     Co-evaluation               AM-PAC PT 6 Clicks Mobility  Outcome Measure Help  needed turning from your back to your side while in a flat bed without using bedrails?: None Help needed moving from lying on your back to sitting on the side of a flat bed without using bedrails?: None Help needed moving to and from a bed to a chair (including a wheelchair)?: A Lot Help needed standing up from a chair using your arms (e.g., wheelchair or bedside chair)?: A Lot Help needed to walk in hospital room?: Total Help needed climbing 3-5 steps with a railing? : Total 6 Click Score: 14    End of Session Equipment Utilized During Treatment: Gait belt Activity Tolerance: Patient tolerated treatment well Patient left: in bed;with call bell/phone within reach;with bed alarm set (sitting EOB to eat lunch) Nurse Communication: Mobility status PT Visit Diagnosis: Unsteadiness on feet (R26.81);Muscle weakness (generalized) (M62.81)    Time: 1300-1316 PT Time Calculation (min) (ACUTE ONLY): 16 min   Charges:   PT Evaluation $PT Eval Low  Complexity: 1 Low   PT General Charges $$ ACUTE PT VISIT: 1 Visit         Norene Ames, PT, DPT Acute Rehabilitation Services Secure chat preferred Office #: [phone]   Norene CHRISTELLA Ames 08/01/2023, 2:05 PM

## 2023-08-01 NOTE — Progress Notes (Signed)
 [facility] Progress Note   Subjective:    Last HD on 7/15 with 800 mL UF.  She had 700 mL UOP over 7/15.  Team ordered PRBC's overnight and she's about to get these.     Review of systems:     Denies shortness of breath; some right sided chest wall pain Denies n/v    Objective Vitals:   07/31/23 2352 08/01/23 0038 08/01/23 0814 08/01/23 0817  BP: (!) 150/90 (!) 148/92  119/67  Pulse: 84 84 78 85  Resp: 19 18 18 18   Temp:  98.2 F (36.8 C)  98.2 F (36.8 C)  TempSrc:  Oral    SpO2: 100% 96%  97%   Physical Exam    General adult female in bed in no acute distress HEENT normocephalic atraumatic extraocular movements intact sclera anicteric Neck supple trachea midline Lungs clear to auscultation bilaterally normal work of breathing at rest on room air Heart S1S2 no rub Abdomen soft nontender nondistended Extremities no edema  Psych normal mood and affect Neuro - awake and conversant and follows commands Access: RIJ tunneled catheter in place      Additional Objective Labs: Basic Metabolic Panel: Recent Labs  Lab 07/30/23 0403 07/31/23 1401 08/01/23 0455  NA 134* 138 137  K 3.5 4.0 3.6  CL 98 99 97*  CO2 25 23 25   GLUCOSE 101* 90 140*  BUN 33* 53* 28*  CREATININE 5.15* 6.25* 3.75*  CALCIUM  8.8* 9.3 9.2  PHOS 4.6 4.5 3.4   Liver Function Tests: Recent Labs  Lab 07/30/23 0403 07/31/23 1401 08/01/23 0455  ALBUMIN  2.8* 3.0* 2.6*   No results for input(s): LIPASE, AMYLASE in the last 168 hours. CBC: Recent Labs  Lab 07/29/23 1032 07/29/23 1728 07/30/23 0403 07/31/23 1401 08/01/23 0455 08/01/23 0839  WBC 6.4 5.9 5.3 6.1 7.3  --   NEUTROABS  --  4.1  --   --   --   --   HGB 7.0* 7.0* 7.1* 7.2* 6.9* 7.0*  HCT 22.8* 22.4* 23.2* 23.4* 22.0* 22.9*  MCV 94.2 93.7 94.7 94.4 94.4  --   PLT 315 331 303 343 264  --    Blood Culture    Component Value Date/Time   SDES BLOOD RIGHT HAND 07/31/2023 1402   SPECREQUEST  07/31/2023 1402     BOTTLES DRAWN AEROBIC ONLY Blood Culture results may not be optimal due to an inadequate volume of blood received in culture bottles   CULT  07/31/2023 1402    NO GROWTH < 24 HOURS Performed at [facility], [address] [place], [place] [address]    REPTSTATUS PENDING 07/31/2023 1402    Cardiac Enzymes: No results for input(s): CKTOTAL, CKMB, CKMBINDEX, TROPONINI in the last 168 hours. CBG: No results for input(s): GLUCAP in the last 168 hours. Iron  Studies:  Recent Labs    07/29/23 1728  IRON  86  TIBC 217*  FERRITIN 687*   @lablastinr3 @ Studies/Results: No results found.  Medications:    sodium chloride    Intravenous Once   acetaminophen   1,000 mg Oral Q8H   atorvastatin   20 mg Oral Daily   budesonide -glycopyrrolate -formoterol   2 puff Inhalation BID   calcitRIOL   0.5 mcg Oral Daily   Chlorhexidine  Gluconate Cloth  6 each Topical Q0600   [START ON 08/04/2023] darbepoetin (ARANESP ) injection - NON-DIALYSIS  100 mcg Subcutaneous Q Fri-1800   dicyclomine   10 mg Oral TID   DULoxetine   30 mg Oral Daily   escitalopram   10 mg Oral Daily   ezetimibe   10 mg Oral Daily   gabapentin   100 mg Oral QHS   heparin   5,000 Units Subcutaneous Q8H   lamoTRIgine   100 mg Oral QHS   lidocaine   1 patch Transdermal Q24H   mirtazapine   15 mg Oral QHS   pantoprazole   40 mg Oral Daily   potassium chloride   10 mEq Oral Daily   QUEtiapine   50 mg Oral BID   torsemide   40 mg Oral BID    OUtpt HD:  TTS [facility] 4hrs EDW 82kg BFR 500 DFR 1.5 2K/2Ca No heparin , ESA, VDRA per orders.   Assessment/Plan   **CP s/p fall:  sounds mechanical, no concern for ACS, per primary.    **Hypotension:  BP acceptable.  Note that to clarify charting she is not currently on midodrine .  Has anti-HTN's on home med list - unsure of compliance    **ESRD on HD:  - HD per TTS schedule  - Thankfully, she's going to [facility] with some more regularity now - given that she still makes urine I would  recommend avoiding NSAID's.  Hx of noncompliance with HD and her residual renal function is especially important given the same  - ordered renal panel daily while here    **Anemia of CKD:  PRBC's per primary team.  Iron  fine 6/14. Rec'd aranesp  100 mcg on 7/12.  ordered for weekly on Fridays while here and will need to assess dose    **BMM:  continue outpt binder; note concern for noncompliance.  on calcitriol .   **Hypokalemia: using added K bath.  Improved  **Staph in blood cultures  - felt to likely be a contaminant - repeat blood cultures 7/15 and the other 7/12 culture is NGTD.       Disposition - per primary team     Katheryn JAYSON Saba, MD 11:12 AM 08/01/2023

## 2023-08-01 NOTE — Progress Notes (Signed)
 [facility] night cross cover note:   I was notified by the patient's RN that the patient is complaining of pruritus refractory to dose of hydroxyzine  administered earlier this evening per existing prn order. patient conveys that  Benadryl  is typically more effective for her in addressing her pruritus.  I subsequently ordered Benadryl  25 mg p.o. x 1 dose now to help further address her refractory pruritus.     Eva Pore, DO Hospitalist
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
## 2023-08-01 NOTE — Progress Notes (Signed)
 [facility] night cross cover note:   I was notified by the patient's RN of this patient's morning hemoglobin level is 6.9, which is down slightly from yesterday's value of 7.2.  Most recent vital signs appear stable, with most recent heart rates in the 80s, systolic blood pressures in the 140s mmHg.   I have confirmed that there is an active type and screen on file.   I have subsequently ordered transfusion of 1 unit PRBC to occur over 3 hours, along with an order for repeat hemoglobin level to be checked after completion of transfusion of this 1 unit PRBC.     Eva Pore, DO Hospitalist

## 2023-08-02 DIAGNOSIS — E861 Hypovolemia: Secondary | ICD-10-CM | POA: Diagnosis not present

## 2023-08-02 LAB — TYPE AND SCREEN
ABO/RH(D): O POS
Antibody Screen: NEGATIVE
Donor AG Type: NEGATIVE
Donor AG Type: NEGATIVE
Unit division: 0
Unit division: 0

## 2023-08-02 LAB — CBC
HCT: 28.5 % — ABNORMAL LOW (ref 36.0–46.0)
Hemoglobin: 8.7 g/dL — ABNORMAL LOW (ref 12.0–15.0)
MCH: 28.8 pg (ref 26.0–34.0)
MCHC: 30.5 g/dL (ref 30.0–36.0)
MCV: 94.4 fL (ref 80.0–100.0)
Platelets: 345 K/uL (ref 150–400)
RBC: 3.02 MIL/uL — ABNORMAL LOW (ref 3.87–5.11)
RDW: 15.5 % (ref 11.5–15.5)
WBC: 6.9 K/uL (ref 4.0–10.5)
nRBC: 0 % (ref 0.0–0.2)

## 2023-08-02 LAB — BPAM RBC
Blood Product Expiration Date: 202507282359
Blood Product Expiration Date: 202508052359
ISSUE DATE / TIME: 202507161106
Unit Type and Rh: 5100
Unit Type and Rh: 9500

## 2023-08-02 LAB — CULTURE, BLOOD (ROUTINE X 2): Culture: NO GROWTH

## 2023-08-02 LAB — RENAL FUNCTION PANEL
Albumin: 3 g/dL — ABNORMAL LOW (ref 3.5–5.0)
Anion gap: 11 (ref 5–15)
BUN: 42 mg/dL — ABNORMAL HIGH (ref 8–23)
CO2: 26 mmol/L (ref 22–32)
Calcium: 9.8 mg/dL (ref 8.9–10.3)
Chloride: 100 mmol/L (ref 98–111)
Creatinine, Ser: 4.39 mg/dL — ABNORMAL HIGH (ref 0.44–1.00)
GFR, Estimated: 11 mL/min — ABNORMAL LOW (ref >60)
Glucose, Bld: 98 mg/dL (ref 70–99)
Phosphorus: 4.5 mg/dL (ref 2.5–4.6)
Potassium: 4 mmol/L (ref 3.5–5.1)
Sodium: 137 mmol/L (ref 135–145)

## 2023-08-02 MED ORDER — OXYCODONE HCL 5 MG PO TABS: 5.0000 mg | ORAL_TABLET | ORAL | Status: DC | PRN

## 2023-08-02 MED ORDER — DIPHENHYDRAMINE HCL 25 MG PO CAPS
25.0000 mg | ORAL_CAPSULE | Freq: Three times a day (TID) | ORAL | Status: DC | PRN
  Administered 2023-08-02 – 2023-08-03 (×2): 25 mg via ORAL
  Filled 2023-08-02 (×2): qty 1

## 2023-08-02 MED ORDER — HYDROMORPHONE HCL 2 MG PO TABS
2.0000 mg | ORAL_TABLET | ORAL | Status: DC | PRN
  Administered 2023-08-02 – 2023-08-04 (×7): 2 mg via ORAL
  Filled 2023-08-02 (×7): qty 1

## 2023-08-02 MED ORDER — HEPARIN SODIUM (PORCINE) 1000 UNIT/ML IJ SOLN
INTRAMUSCULAR | Status: AC
  Filled 2023-08-02: qty 4

## 2023-08-02 MED ORDER — HYDROXYZINE HCL 10 MG PO TABS
10.0000 mg | ORAL_TABLET | Freq: Three times a day (TID) | ORAL | Status: DC | PRN
  Administered 2023-08-02: 10 mg via ORAL
  Filled 2023-08-02: qty 1

## 2023-08-02 MED ORDER — HYDROXYZINE HCL 10 MG PO TABS
10.0000 mg | ORAL_TABLET | Freq: Three times a day (TID) | ORAL | Status: DC
  Administered 2023-08-02 – 2023-08-05 (×9): 10 mg via ORAL
  Filled 2023-08-02 (×9): qty 1

## 2023-08-02 MED ORDER — DARBEPOETIN ALFA 150 MCG/0.3ML IJ SOSY
150.0000 ug | PREFILLED_SYRINGE | INTRAMUSCULAR | Status: DC
  Filled 2023-08-02 (×2): qty 0.3

## 2023-08-02 MED ORDER — CARVEDILOL 12.5 MG PO TABS
12.5000 mg | ORAL_TABLET | Freq: Two times a day (BID) | ORAL | Status: DC
  Administered 2023-08-02 – 2023-08-05 (×6): 12.5 mg via ORAL
  Filled 2023-08-02 (×7): qty 1

## 2023-08-02 MED ORDER — HYDROXYZINE HCL 10 MG PO TABS: 10.0000 mg | ORAL_TABLET | Freq: Three times a day (TID) | ORAL | Status: DC

## 2023-08-02 NOTE — Progress Notes (Signed)
 Physical Therapy Treatment Patient Details Name: Tiffany [name] MRN: [id number] DOB: May 24, 1960 Today's Date: 08/02/2023   History of Present Illness Pt is a 63yo female who came to ED from home presenting with chest pain, diarrhea, and hypotension. Pt poor historian but reports a possible fall resulting in chest pain. PMH: ESRD on HD TTS, chronic diastolic CGF, schizoaffective disorders, anemia, OSA, GERD, neuropathy, anxiety/depression, h/o seizure d/o    PT Comments  Pt seems to have significant variations in functional abilities before and after HD. Pt currently Max A to Total A for bed mobility and unable to progress to standing. Worked on easy there-ex sitting EOB with AA/ROM of the L knee for extension, A/Rom of the R knee, No movement for R ankle DF/PF, 3/5 strength L DF/PF, Hip add/abd A/ROM. Pt was able to sit up for less than 10 min EOB before requiring total A to supine due to fatigue. Due to pt current functional status, home set up and available assistance at home recommending skilled physical therapy services < 3 hours/day in order to address strength, balance and functional mobility to decrease risk for falls, injury, immobility, skin break down and re-hospitalization.      If plan is discharge home, recommend the following: Two people to help with walking and/or transfers;Assistance with cooking/housework;Assist for transportation     Equipment Recommendations  Wheelchair (measurements PT);Wheelchair cushion (measurements PT)       Precautions / Restrictions Precautions Precautions: Fall Recall of Precautions/Restrictions: Intact Restrictions Weight Bearing Restrictions Per Provider Order: No     Mobility  Bed Mobility Overal bed mobility: Needs Assistance Bed Mobility: Supine to Sit, Sit to Supine     Supine to sit: Max assist Sit to supine: Total assist   General bed mobility comments: Pt was Max A to EOB with HOB completely elevated and use of rails with verbal  cues for sequencing. Max A for LE and Max A for trunk to mid line. Total A to supine    Transfers   General transfer comment: Pt unable to get to standing today due to weakness/pain    Ambulation/Gait     General Gait Details: unable     Balance Overall balance assessment: Needs assistance Sitting-balance support: Feet supported, Single extremity supported Sitting balance-Leahy Scale: Poor Sitting balance - Comments: CGA to Min A with heavy reliance on RUE        Communication Communication Communication: No apparent difficulties Factors Affecting Communication: Reduced clarity of speech  Cognition Arousal: Alert Behavior During Therapy: Flat affect   PT - Cognitive impairments: History of cognitive impairments     PT - Cognition Comments: aware of psych history, pt just had dialysis agreeable to EOB mobility Following commands: Intact      Cueing Cueing Techniques: Verbal cues         Pertinent Vitals/Pain Pain Assessment Pain Assessment: Faces Faces Pain Scale: Hurts whole lot Pain Location: L shoulder and ribs Pain Descriptors / Indicators: Discomfort, Aching, Sharp Pain Intervention(s): Monitored during session, Limited activity within patient's tolerance     PT Goals (current goals can now be found in the care plan section) Acute Rehab PT Goals Patient Stated Goal: home PT Goal Formulation: With patient Time For Goal Achievement: 08/08/23 Potential to Achieve Goals: Fair Progress towards PT goals: Progressing toward goals    Frequency    Min 2X/week      PT Plan  Continue with current POC     AM-PAC PT 6 Clicks Mobility  Outcome Measure  Help needed turning from your back to your side while in a flat bed without using bedrails?: A Lot Help needed moving from lying on your back to sitting on the side of a flat bed without using bedrails?: A Lot Help needed moving to and from a bed to a chair (including a wheelchair)?: Total Help needed  standing up from a chair using your arms (e.g., wheelchair or bedside chair)?: Total Help needed to walk in hospital room?: Total Help needed climbing 3-5 steps with a railing? : Total 6 Click Score: 8    End of Session   Activity Tolerance: Patient limited by pain;Patient limited by fatigue Patient left: in bed;with call bell/phone within reach;with bed alarm set Nurse Communication: Mobility status PT Visit Diagnosis: Unsteadiness on feet (R26.81);Muscle weakness (generalized) (M62.81)     Time: 8572-8557 PT Time Calculation (min) (ACUTE ONLY): 15 min  Charges:    $Therapeutic Activity: 8-22 mins PT General Charges $$ ACUTE PT VISIT: 1 Visit                     Dorothyann Maier, DPT, CLT  Acute Rehabilitation Services Office: [phone] (Secure chat preferred)    Dorothyann VEAR Maier 08/02/2023, 2:47 PM

## 2023-08-02 NOTE — Progress Notes (Signed)
 [facility] Progress Note   Subjective:    S/p PRBC's on 7/16.  Seen and examined on dialysis.  Procedure supervised.  Blood pressure 141/82 and HR 81.  Tolerating goal.  RIJ tunn catheter in use.  States she feels short of breath - we increased UF goal to 2.5 kg.  On 2 liters oxygen  and states not on oxygen  at home but does wear it with dialysis.    Objective Vitals:   08/02/23 0830 08/02/23 0900 08/02/23 0930 08/02/23 0940  BP: 131/88 (!) 160/97 (!) 142/96   Pulse: 84 81 83 80  Resp: 14 19 13 13   Temp:      TempSrc:      SpO2: 100% 100% 100% 100%  Weight:       Physical Exam    General adult female in bed in no acute distress HEENT normocephalic atraumatic extraocular movements intact sclera anicteric Neck supple trachea midline Lungs clear to auscultation bilaterally normal work of breathing at rest on 2 liters Heart S1S2 no rub Abdomen soft nontender nondistended Extremities no edema  Psych normal mood and affect Neuro - awake and conversant and follows commands Access: RIJ tunneled catheter in place      Additional Objective Labs: Basic Metabolic Panel: Recent Labs  Lab 07/31/23 1401 08/01/23 0455 08/02/23 0527  NA 138 137 137  K 4.0 3.6 4.0  CL 99 97* 100  CO2 23 25 26   GLUCOSE 90 140* 98  BUN 53* 28* 42*  CREATININE 6.25* 3.75* 4.39*  CALCIUM  9.3 9.2 9.8  PHOS 4.5 3.4 4.5   Liver Function Tests: Recent Labs  Lab 07/31/23 1401 08/01/23 0455 08/02/23 0527  ALBUMIN  3.0* 2.6* 3.0*   No results for input(s): LIPASE, AMYLASE in the last 168 hours. CBC: Recent Labs  Lab 07/29/23 1728 07/30/23 0403 07/31/23 1401 08/01/23 0455 08/01/23 0839 08/01/23 2339 08/02/23 0527  WBC 5.9 5.3 6.1 7.3  --   --  6.9  NEUTROABS 4.1  --   --   --   --   --   --   HGB 7.0* 7.1* 7.2* 6.9* 7.0* 7.5* 8.7*  HCT 22.4* 23.2* 23.4* 22.0* 22.9* 23.9* 28.5*  MCV 93.7 94.7 94.4 94.4  --   --  94.4  PLT 331 303 343 264  --   --  345   Blood Culture     Component Value Date/Time   SDES BLOOD RIGHT HAND 07/31/2023 1402   SPECREQUEST  07/31/2023 1402    BOTTLES DRAWN AEROBIC ONLY Blood Culture results may not be optimal due to an inadequate volume of blood received in culture bottles   CULT  07/31/2023 1402    NO GROWTH 2 DAYS Performed at [facility], [address] [place], [place] [address]    REPTSTATUS PENDING 07/31/2023 1402    Cardiac Enzymes: No results for input(s): CKTOTAL, CKMB, CKMBINDEX, TROPONINI in the last 168 hours. CBG: No results for input(s): GLUCAP in the last 168 hours. Iron  Studies:  No results for input(s): IRON , TIBC, TRANSFERRIN, FERRITIN in the last 72 hours.  @lablastinr3 @ Studies/Results: No results found.  Medications:    sodium chloride    Intravenous Once   acetaminophen   1,000 mg Oral Q8H   atorvastatin   20 mg Oral Daily   budesonide -glycopyrrolate -formoterol   2 puff Inhalation BID   calcitRIOL   0.5 mcg Oral Daily   cephALEXin   500 mg Oral Q0600   Chlorhexidine  Gluconate Cloth  6 each Topical Q0600   Chlorhexidine  Gluconate  Cloth  6 each Topical Q0600   [START ON 08/04/2023] darbepoetin (ARANESP ) injection - NON-DIALYSIS  100 mcg Subcutaneous Q Fri-1800   dicyclomine   10 mg Oral TID   DULoxetine   30 mg Oral Daily   escitalopram   10 mg Oral Daily   ezetimibe   10 mg Oral Daily   gabapentin   100 mg Oral QHS   heparin   5,000 Units Subcutaneous Q8H   lamoTRIgine   100 mg Oral QHS   lidocaine   1 patch Transdermal Q24H   mirtazapine   15 mg Oral QHS   pantoprazole   40 mg Oral Daily   potassium chloride   10 mEq Oral Daily   QUEtiapine   50 mg Oral BID   torsemide   40 mg Oral BID    OUtpt HD:  TTS [facility] 4hrs EDW 82kg BFR 500 DFR 1.5 2K/2Ca No heparin , ESA, VDRA per orders.   Assessment/Plan   **CP s/p fall:  sounds mechanical, no concern for ACS, per primary.    **HTN:  BP acceptable.  Has anti-HTN's on home med list - unsure of compliance.  Optimize volume with  HD   **ESRD on HD:  - HD per TTS schedule  - Thankfully, she's going to [facility] with some more regularity now - given that she still makes urine I would recommend avoiding NSAID's.  Hx of noncompliance with HD and her residual renal function is especially important given the same    **Anemia of CKD:  PRBC's per primary team.  Iron  fine 6/14. Rec'd aranesp  100 mcg on 7/12.  ordered for weekly on Fridays.  Increase to 150 mcg every Friday    **BMM:  continue outpt binder; note concern for noncompliance.  on calcitriol .   **Hypokalemia: using added K bath.  Improved  **Staph in blood cultures  - felt to likely be a contaminant - repeat blood cultures 7/15 and the other 7/12 culture is NGTD.       Disposition - per primary team     Katheryn JAYSON Saba, MD 10:08 AM 08/02/2023

## 2023-08-02 NOTE — Plan of Care (Signed)
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
## 2023-08-02 NOTE — Progress Notes (Signed)
 PT Cancellation Note  Patient Details Name: Tiffany [name] MRN: [id number] DOB: 1960/04/26   Cancelled Treatment:    Reason Eval/Treat Not Completed: Patient at procedure or test/unavailable (Pt in HD in am. WIll continue to follow up as able and appropriate.)  Tiffany [name], DPT, CLT  Acute Rehabilitation Services Office: [phone] (Secure chat preferred)   Tiffany [name] 08/02/2023, 9:33 AM

## 2023-08-03 DIAGNOSIS — E861 Hypovolemia: Secondary | ICD-10-CM | POA: Diagnosis not present

## 2023-08-03 LAB — CBC
HCT: 25.5 % — ABNORMAL LOW (ref 36.0–46.0)
Hemoglobin: 7.7 g/dL — ABNORMAL LOW (ref 12.0–15.0)
MCH: 28.6 pg (ref 26.0–34.0)
MCHC: 30.2 g/dL (ref 30.0–36.0)
MCV: 94.8 fL (ref 80.0–100.0)
Platelets: 301 K/uL (ref 150–400)
RBC: 2.69 MIL/uL — ABNORMAL LOW (ref 3.87–5.11)
RDW: 15.3 % (ref 11.5–15.5)
WBC: 6.7 K/uL (ref 4.0–10.5)
nRBC: 0 % (ref 0.0–0.2)

## 2023-08-03 LAB — URINALYSIS, W/ REFLEX TO CULTURE (INFECTION SUSPECTED)
Bilirubin Urine: NEGATIVE
Glucose, UA: NEGATIVE mg/dL
Hgb urine dipstick: NEGATIVE
Ketones, ur: NEGATIVE mg/dL
Nitrite: NEGATIVE
Protein, ur: NEGATIVE mg/dL
Specific Gravity, Urine: 1.005 (ref 1.005–1.030)
pH: 6 (ref 5.0–8.0)

## 2023-08-03 LAB — RENAL FUNCTION PANEL
Albumin: 2.7 g/dL — ABNORMAL LOW (ref 3.5–5.0)
Anion gap: 10 (ref 5–15)
BUN: 29 mg/dL — ABNORMAL HIGH (ref 8–23)
CO2: 27 mmol/L (ref 22–32)
Calcium: 9.6 mg/dL (ref 8.9–10.3)
Chloride: 96 mmol/L — ABNORMAL LOW (ref 98–111)
Creatinine, Ser: 3.65 mg/dL — ABNORMAL HIGH (ref 0.44–1.00)
GFR, Estimated: 13 mL/min — ABNORMAL LOW (ref >60)
Glucose, Bld: 94 mg/dL (ref 70–99)
Phosphorus: 3.9 mg/dL (ref 2.5–4.6)
Potassium: 4.3 mmol/L (ref 3.5–5.1)
Sodium: 133 mmol/L — ABNORMAL LOW (ref 135–145)

## 2023-08-03 LAB — MAGNESIUM: Magnesium: 1.7 mg/dL (ref 1.7–2.4)

## 2023-08-03 MED ORDER — CHLORHEXIDINE GLUCONATE CLOTH 2 % EX PADS
6.0000 | MEDICATED_PAD | Freq: Every day | CUTANEOUS | Status: DC
  Administered 2023-08-04 – 2023-08-05 (×2): 6 via TOPICAL

## 2023-08-03 NOTE — Plan of Care (Signed)
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
## 2023-08-03 NOTE — Progress Notes (Signed)
 [facility] Progress Note   Subjective:    Last HD on 7/17 with 2.9 kg UF.  She has had 670 mL UOP over 7/18 thus far and had 300 mL UOP over 7/17 charted.  She states she's had some shortness of breath but then also states that she's had cramping the last treatment or two.    Review of systems:  Reports some shortness of breath  Denies n/v Denies chest pain   Objective Vitals:   08/03/23 0751 08/03/23 0850 08/03/23 1004 08/03/23 1100  BP: 112/67  121/80   Pulse: 79 77 84   Resp:  18 18 16   Temp: 98.2 F (36.8 C)  97.8 F (36.6 C)   TempSrc: Oral  Oral   SpO2: 99% 99% 100%   Weight:      Height:       Physical Exam     General adult female in bed in no acute distress HEENT normocephalic atraumatic extraocular movements intact sclera anicteric Neck supple trachea midline Lungs clear to auscultation bilaterally normal work of breathing at rest on 2 liters (room air on arrival - oxygen  had fallen off) Heart S1S2 no rub Abdomen soft nontender nondistended Extremities no edema  Psych normal mood and affect Neuro - awake and conversant and follows commands Access: RIJ tunneled catheter in place      Additional Objective Labs: Basic Metabolic Panel: Recent Labs  Lab 08/01/23 0455 08/02/23 0527 08/03/23 0419  NA 137 137 133*  K 3.6 4.0 4.3  CL 97* 100 96*  CO2 25 26 27   GLUCOSE 140* 98 94  BUN 28* 42* 29*  CREATININE 3.75* 4.39* 3.65*  CALCIUM  9.2 9.8 9.6  PHOS 3.4 4.5 3.9   Liver Function Tests: Recent Labs  Lab 08/01/23 0455 08/02/23 0527 08/03/23 0419  ALBUMIN  2.6* 3.0* 2.7*   No results for input(s): LIPASE, AMYLASE in the last 168 hours. CBC: Recent Labs  Lab 07/29/23 1728 07/30/23 0403 07/31/23 1401 08/01/23 0455 08/01/23 0839 08/01/23 2339 08/02/23 0527 08/03/23 0419  WBC 5.9 5.3 6.1 7.3  --   --  6.9 6.7  NEUTROABS 4.1  --   --   --   --   --   --   --   HGB 7.0* 7.1* 7.2* 6.9*   < > 7.5* 8.7* 7.7*  HCT 22.4* 23.2*  23.4* 22.0*   < > 23.9* 28.5* 25.5*  MCV 93.7 94.7 94.4 94.4  --   --  94.4 94.8  PLT 331 303 343 264  --   --  345 301   < > = values in this interval not displayed.   Blood Culture    Component Value Date/Time   SDES BLOOD RIGHT HAND 07/31/2023 1402   SPECREQUEST  07/31/2023 1402    BOTTLES DRAWN AEROBIC ONLY Blood Culture results may not be optimal due to an inadequate volume of blood received in culture bottles   CULT  07/31/2023 1402    NO GROWTH 3 DAYS Performed at [facility], [address], [place], [place] [address]    REPTSTATUS PENDING 07/31/2023 1402    Cardiac Enzymes: No results for input(s): CKTOTAL, CKMB, CKMBINDEX, TROPONINI in the last 168 hours. CBG: No results for input(s): GLUCAP in the last 168 hours. Iron  Studies:  No results for input(s): IRON , TIBC, TRANSFERRIN, FERRITIN in the last 72 hours.  @lablastinr3 @ Studies/Results: No results found.  Medications:    sodium chloride    Intravenous Once   atorvastatin   20 mg Oral Daily   budesonide -glycopyrrolate -formoterol   2 puff Inhalation BID   calcitRIOL   0.5 mcg Oral Daily   carvedilol   12.5 mg Oral BID   Chlorhexidine  Gluconate Cloth  6 each Topical Q0600   darbepoetin (ARANESP ) injection - NON-DIALYSIS  150 mcg Subcutaneous Q Fri-1800   dicyclomine   10 mg Oral TID   DULoxetine   30 mg Oral Daily   escitalopram   10 mg Oral Daily   ezetimibe   10 mg Oral Daily   gabapentin   100 mg Oral QHS   heparin   5,000 Units Subcutaneous Q8H   hydrOXYzine   10 mg Oral TID   lamoTRIgine   100 mg Oral QHS   lidocaine   1 patch Transdermal Q24H   mirtazapine   15 mg Oral QHS   pantoprazole   40 mg Oral Daily   potassium chloride   10 mEq Oral Daily   QUEtiapine   50 mg Oral BID   torsemide   40 mg Oral BID    OUtpt HD:  TTS [facility] 4hrs EDW 82kg BFR 500 DFR 1.5 2K/2Ca No heparin , ESA, VDRA per orders.   Assessment/Plan   **Chest pain s/p fall:  sounds mechanical, no concern for ACS, per  primary.    **HTN:  BP acceptable.  Has anti-HTN's on home med list - unsure of compliance.  Optimize volume with HD   **ESRD on HD:  - HD per TTS schedule.  First shift on 7/19 as team states that she will be discharged home after HD  - Thankfully, she's going to outpatient HD with some more regularity now - given that she still makes urine I would recommend avoiding NSAID's.  She has a history of noncompliance with HD and her residual renal function is especially important given the same    **Anemia of CKD:  PRBC's per primary team.  Iron  fine 6/14. Rec'd aranesp  100 mcg on 7/12.  ordered for weekly on Fridays.  Increased to 150 mcg every Friday    **BMM:  continue outpt binder; note concern for noncompliance.  on calcitriol .   **Hypokalemia: using added K bath.  Improved. Note on potassium 10 meq daily here - we can stop this  **Staph in blood cultures  - felt to likely be a contaminant - repeat blood cultures 7/15 no growth to date and the other 7/12 culture is negative (final).       Disposition - per primary team     Katheryn JAYSON Saba, MD 3:34 PM 08/03/2023

## 2023-08-03 NOTE — TOC Progression Note (Signed)
 Transition of Care [facility]) - Progression Note    Patient Details  Name: Tiffany [name] MRN: [id number] Date of Birth: 01-09-1961  Transition of Care [facility]) CM/SW Contact  Lauraine FORBES Saa, LCSW Phone Number: 08/03/2023, 9:53 AM  Clinical Narrative:     9:53 AM [name] introduced self and role to patient. [name] informed patient of physical therapy's recommendation of patient discharging to SNF. Patient declined recommendation and expressed preference in discharging home.  Expected Discharge Plan: Home/Self Care Barriers to Discharge: Continued Medical Work up  Expected Discharge Plan and Services In-house Referral: Clinical Social Work                                             Social Determinants of Health (SDOH) Interventions SDOH Screenings   Food Insecurity: Food Insecurity Present (08/02/2023)  Housing: Low Risk  (07/29/2023)  Transportation Needs: No Transportation Needs (07/29/2023)  Utilities: Not At Risk (07/29/2023)  Alcohol Screen: Low Risk  (11/10/2022)  Depression (PHQ2-9): Low Risk  (07/11/2022)  Financial Resource Strain: Low Risk  (07/11/2022)  Physical Activity: Inactive (07/11/2022)  Social Connections: Socially Isolated (06/28/2023)  Stress: Stress Concern Present (07/11/2022)  Tobacco Use: Medium Risk (07/27/2023)    Readmission Risk Interventions    06/29/2023    3:13 PM 06/05/2022    2:47 PM 02/07/2022    2:22 PM  Readmission Risk Prevention Plan  Transportation Screening Complete Complete Complete  Medication Review (RN Care Manager) Referral to Pharmacy Referral to Pharmacy Complete  PCP or Specialist appointment within 3-5 days of discharge Complete Complete Complete  HRI or Home Care Consult Complete Complete Complete  SW Recovery Care/Counseling Consult Complete Complete Complete  Palliative Care Screening Not Applicable Not Applicable Not Applicable  Skilled Nursing Facility Not Applicable Not Applicable Not Applicable

## 2023-08-04 DIAGNOSIS — R0789 Other chest pain: Secondary | ICD-10-CM | POA: Diagnosis not present

## 2023-08-04 DIAGNOSIS — E861 Hypovolemia: Secondary | ICD-10-CM | POA: Diagnosis not present

## 2023-08-04 LAB — RENAL FUNCTION PANEL
Albumin: 2.8 g/dL — ABNORMAL LOW (ref 3.5–5.0)
Anion gap: 11 (ref 5–15)
BUN: 49 mg/dL — ABNORMAL HIGH (ref 8–23)
CO2: 27 mmol/L (ref 22–32)
Calcium: 9.2 mg/dL (ref 8.9–10.3)
Chloride: 94 mmol/L — ABNORMAL LOW (ref 98–111)
Creatinine, Ser: 4.91 mg/dL — ABNORMAL HIGH (ref 0.44–1.00)
GFR, Estimated: 9 mL/min — ABNORMAL LOW (ref >60)
Glucose, Bld: 99 mg/dL (ref 70–99)
Phosphorus: 5.1 mg/dL — ABNORMAL HIGH (ref 2.5–4.6)
Potassium: 4.1 mmol/L (ref 3.5–5.1)
Sodium: 132 mmol/L — ABNORMAL LOW (ref 135–145)

## 2023-08-04 LAB — CBC
HCT: 24.1 % — ABNORMAL LOW (ref 36.0–46.0)
Hemoglobin: 7.3 g/dL — ABNORMAL LOW (ref 12.0–15.0)
MCH: 29.1 pg (ref 26.0–34.0)
MCHC: 30.3 g/dL (ref 30.0–36.0)
MCV: 96 fL (ref 80.0–100.0)
Platelets: 294 K/uL (ref 150–400)
RBC: 2.51 MIL/uL — ABNORMAL LOW (ref 3.87–5.11)
RDW: 15.5 % (ref 11.5–15.5)
WBC: 7.5 K/uL (ref 4.0–10.5)
nRBC: 0 % (ref 0.0–0.2)

## 2023-08-04 MED ORDER — TORSEMIDE 40 MG PO TABS: 40.0000 mg | ORAL_TABLET | Freq: Two times a day (BID) | ORAL | 3 refills | Status: DC

## 2023-08-04 MED ORDER — LIDOCAINE-PRILOCAINE 2.5-2.5 % EX CREA: 1.0000 | TOPICAL_CREAM | CUTANEOUS | Status: DC | PRN

## 2023-08-04 MED ORDER — HYDROMORPHONE HCL 2 MG PO TABS
1.0000 mg | ORAL_TABLET | ORAL | Status: DC | PRN
  Administered 2023-08-04 – 2023-08-05 (×2): 1 mg via ORAL
  Filled 2023-08-04 (×2): qty 1

## 2023-08-04 MED ORDER — HEPARIN SODIUM (PORCINE) 1000 UNIT/ML DIALYSIS
1000.0000 [IU] | INTRAMUSCULAR | Status: DC | PRN
  Administered 2023-08-04: 3200 [IU]

## 2023-08-04 MED ORDER — HYDROMORPHONE HCL 2 MG PO TABS: 2.0000 mg | ORAL_TABLET | Freq: Four times a day (QID) | ORAL | 0 refills | Status: DC | PRN

## 2023-08-04 MED ORDER — HYDROMORPHONE HCL 2 MG PO TABS
ORAL_TABLET | ORAL | Status: AC
Start: 2023-08-04 — End: 2023-08-04
  Filled 2023-08-04: qty 1

## 2023-08-04 MED ORDER — DIPHENHYDRAMINE HCL 25 MG PO CAPS
25.0000 mg | ORAL_CAPSULE | Freq: Four times a day (QID) | ORAL | Status: DC | PRN
  Administered 2023-08-04: 25 mg via ORAL
  Filled 2023-08-04: qty 1

## 2023-08-04 MED ORDER — LIDOCAINE HCL (PF) 1 % IJ SOLN: 5.0000 mL | INTRAMUSCULAR | Status: DC | PRN

## 2023-08-04 MED ORDER — INSULIN ASPART 100 UNIT/ML IJ SOLN: 0.0000 [IU] | Freq: Three times a day (TID) | INTRAMUSCULAR | Status: DC

## 2023-08-04 MED ORDER — HEPARIN SODIUM (PORCINE) 1000 UNIT/ML IJ SOLN
INTRAMUSCULAR | Status: AC
  Filled 2023-08-04: qty 4

## 2023-08-04 MED ORDER — LOPERAMIDE HCL 2 MG PO CAPS
2.0000 mg | ORAL_CAPSULE | ORAL | 1 refills | Status: DC | PRN
Start: 2023-08-04 — End: 2023-09-07

## 2023-08-04 MED ORDER — ANTICOAGULANT SODIUM CITRATE 4% (200MG/5ML) IV SOLN: 5.0000 mL | Status: DC | PRN

## 2023-08-04 MED ORDER — CALCITRIOL 0.5 MCG PO CAPS
ORAL_CAPSULE | ORAL | Status: AC
  Filled 2023-08-04: qty 1

## 2023-08-04 MED ORDER — PENTAFLUOROPROP-TETRAFLUOROETH EX AERO: 1.0000 | INHALATION_SPRAY | CUTANEOUS | Status: DC | PRN

## 2023-08-04 MED ORDER — DARBEPOETIN ALFA 150 MCG/0.3ML IJ SOSY
150.0000 ug | PREFILLED_SYRINGE | Freq: Once | INTRAMUSCULAR | Status: AC
  Administered 2023-08-04: 150 ug via SUBCUTANEOUS
  Filled 2023-08-04: qty 0.3

## 2023-08-04 MED ORDER — ALTEPLASE 2 MG IJ SOLR: 2.0000 mg | Freq: Once | INTRAMUSCULAR | Status: DC | PRN
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
## 2023-08-04 NOTE — Plan of Care (Signed)
 Patient is borderline hypotensive holding Coreg  for tonight.  Also patient has pain medication seeking behavior as well as Benadryl .

## 2023-08-04 NOTE — Progress Notes (Signed)
 [facility] Progress Note   Subjective:    She had 1 liter UOP over 7/18.  Seen and examined on dialysis.  Procedure supervised.  Blood pressure 117/67 and HR 73.  Tolerating goal.  Increase UF goal to 2 kg.   She missed her aranesp  dose yesterday - reordered for today.  Per nurse, the patient refused it yesterday.  She states she didn't know what the medication was or she wouldn't have refused it  Review of systems:   Reports some shortness of breath today Denies n/v Denies chest pain   Objective Vitals:   08/04/23 0830 08/04/23 0900 08/04/23 0930 08/04/23 1000  BP: 121/69 119/71 110/75 117/67  Pulse: 71 71 72 72  Resp: 12 12 12 14   Temp:      TempSrc:      SpO2: 100% 100% 98% 100%  Weight: 87.7 kg   88.5 kg  Height:       Physical Exam   General adult female in bed in no acute distress HEENT normocephalic atraumatic extraocular movements intact sclera anicteric Neck supple trachea midline Lungs clear to auscultation bilaterally normal work of breathing at rest on 2 liters  Heart S1S2 no rub Abdomen soft nontender nondistended Extremities no edema  Psych normal mood and affect Neuro - awake and conversant and follows commands Access: RIJ tunneled catheter in place      Additional Objective Labs: Basic Metabolic Panel: Recent Labs  Lab 08/02/23 0527 08/03/23 0419 08/04/23 0525  NA 137 133* 132*  K 4.0 4.3 4.1  CL 100 96* 94*  CO2 26 27 27   GLUCOSE 98 94 99  BUN 42* 29* 49*  CREATININE 4.39* 3.65* 4.91*  CALCIUM  9.8 9.6 9.2  PHOS 4.5 3.9 5.1*   Liver Function Tests: Recent Labs  Lab 08/02/23 0527 08/03/23 0419 08/04/23 0525  ALBUMIN  3.0* 2.7* 2.8*   No results for input(s): LIPASE, AMYLASE in the last 168 hours. CBC: Recent Labs  Lab 07/29/23 1728 07/30/23 0403 07/31/23 1401 08/01/23 0455 08/01/23 0839 08/02/23 0527 08/03/23 0419 08/04/23 0525  WBC 5.9   < > 6.1 7.3  --  6.9 6.7 7.5  NEUTROABS 4.1  --   --   --   --   --    --   --   HGB 7.0*   < > 7.2* 6.9*   < > 8.7* 7.7* 7.3*  HCT 22.4*   < > 23.4* 22.0*   < > 28.5* 25.5* 24.1*  MCV 93.7   < > 94.4 94.4  --  94.4 94.8 96.0  PLT 331   < > 343 264  --  345 301 294   < > = values in this interval not displayed.   Blood Culture    Component Value Date/Time   SDES BLOOD RIGHT HAND 07/31/2023 1402   SPECREQUEST  07/31/2023 1402    BOTTLES DRAWN AEROBIC ONLY Blood Culture results may not be optimal due to an inadequate volume of blood received in culture bottles   CULT  07/31/2023 1402    NO GROWTH 4 DAYS Performed at [facility], [address], [place], [place] [address]    REPTSTATUS PENDING 07/31/2023 1402    Cardiac Enzymes: No results for input(s): CKTOTAL, CKMB, CKMBINDEX, TROPONINI in the last 168 hours. CBG: No results for input(s): GLUCAP in the last 168 hours. Iron  Studies:  No results for input(s): IRON , TIBC, TRANSFERRIN, FERRITIN in the last 72 hours.  @lablastinr3 @ Studies/Results: No results found.  Medications:  anticoagulant sodium citrate        sodium chloride    Intravenous Once   atorvastatin   20 mg Oral Daily   budesonide -glycopyrrolate -formoterol   2 puff Inhalation BID   calcitRIOL   0.5 mcg Oral Daily   carvedilol   12.5 mg Oral BID   Chlorhexidine  Gluconate Cloth  6 each Topical Q0600   darbepoetin (ARANESP ) injection - NON-DIALYSIS  150 mcg Subcutaneous Q Fri-1800   dicyclomine   10 mg Oral TID   DULoxetine   30 mg Oral Daily   escitalopram   10 mg Oral Daily   ezetimibe   10 mg Oral Daily   gabapentin   100 mg Oral QHS   heparin   5,000 Units Subcutaneous Q8H   hydrOXYzine   10 mg Oral TID   lamoTRIgine   100 mg Oral QHS   lidocaine   1 patch Transdermal Q24H   mirtazapine   15 mg Oral QHS   pantoprazole   40 mg Oral Daily   QUEtiapine   50 mg Oral BID   torsemide   40 mg Oral BID    OUtpt HD:  TTS [facility] 4hrs EDW 82kg BFR 500 DFR 1.5 2K/2Ca No heparin , ESA, VDRA per orders.    Assessment/Plan   **Chest pain s/p fall:  sounds mechanical, no concern for ACS, per primary.    **HTN:  BP acceptable.  Has anti-HTN's on home med list - unsure of compliance.  Optimize volume with HD - would continue current regimen on discharge as she has been stable on same   **ESRD on HD:  - HD per TTS schedule.  She's getting HD now on first shift on 7/19 as team states that she will be discharged home after HD  - Thankfully, she's going to outpatient HD with some more regularity now.  Encouraged her   **Anemia of CKD:  PRBC's per primary team.  Iron  fine 6/14. Rec'd aranesp  100 mcg on 7/12.  ordered for weekly on Fridays.  Increased to 150 mcg every Friday but she missed this dose.  Retimed dose for today   **BMM:  continue outpt binder; note concern for noncompliance.  on calcitriol .   **Hypokalemia: using added K bath.  Improved   **Staph in blood cultures  - felt to be a contaminant - repeat blood cultures 7/15 are no growth to date and the other 7/12 culture is negative (final).       Disposition - per primary team     Katheryn JAYSON Saba, MD 10:17 AM 08/04/2023

## 2023-08-04 NOTE — Plan of Care (Signed)

## 2023-08-04 NOTE — Progress Notes (Signed)
   08/04/23 1218  Vitals  Temp (!) 97.3 F (36.3 C)  Pulse Rate 79  Resp (!) 34  BP 110/71  SpO2 100 %  O2 Device Nasal Cannula  Weight 85.3 kg  Type of Weight Post-Dialysis  Oxygen  Therapy  O2 Flow Rate (L/min) 2 L/min  Patient Activity (if Appropriate) In bed  Pulse Oximetry Type Continuous  Post Treatment  Dialyzer Clearance Lightly streaked  Liters Processed 84  Fluid Removed (mL) 2000 mL  Tolerated HD Treatment Yes   Received patient in bed to unit.  Alert and oriented.  Informed consent signed and in chart.    TX duration:3.5hrs   Patient tolerated well.  Transported back to the room  Alert, without acute distress.  Hand-off given to patient's nurse.    Access used: CVC  Access issues: none   Total UF removed: 2L Medication(s) given: see eMAR   [place] LITTIE Dolores, RN [facility]
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
## 2023-08-05 DIAGNOSIS — Z992 Dependence on renal dialysis: Secondary | ICD-10-CM

## 2023-08-05 DIAGNOSIS — N186 End stage renal disease: Secondary | ICD-10-CM | POA: Diagnosis not present

## 2023-08-05 LAB — CULTURE, BLOOD (ROUTINE X 2)
Culture: NO GROWTH
Culture: NO GROWTH
Special Requests: ADEQUATE

## 2023-08-05 MED ORDER — HYDROMORPHONE HCL 2 MG PO TABS: 2.0000 mg | ORAL_TABLET | Freq: Four times a day (QID) | ORAL | 0 refills | Status: AC | PRN
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
## 2023-08-05 NOTE — Progress Notes (Signed)
Pt discharged home with PTAR 

## 2023-08-05 NOTE — Progress Notes (Signed)
 Pt argumentative with staff, refusing blood sugar checks and blood pressure rechecks. Informed her that medicines may be held without accurate vital signs readings and pt began screaming that nurses didn't know what they are doing. Acknowledged concerns and obtained order to hold coreg .

## 2023-08-05 NOTE — Plan of Care (Signed)

## 2023-08-05 NOTE — Discharge Instructions (Addendum)
 Tiffany [name], It was a pleasure taking care of you at [facility]. You were admitted for low blood pressure and chest discomfort. Your blood pressure has improved and you received dialysis while in the hospital.  We are discharging you home now that you are doing better. Please follow the following instructions.  1) continue dialysis in the outpatient 2) continue taking all your medications as prescribed 3) call your primary care doctor for hospital follow-up in 1 week  Take care,  Dr. Claretta Alderman, MD, MPH

## 2023-08-05 NOTE — TOC Transition Note (Addendum)
 Transition of Care [facility]) - Discharge Note   Patient Details  Name: Tiffany [name] MRN: [id number] Date of Birth: June 29, 1960  Transition of Care [facility]) CM/SW Contact:  Robynn Eileen Hoose, RN Phone Number: 08/05/2023, 9:16 AM   Clinical Narrative:   Patient is being discharged home today. Spoke with patient confirmed she has a way to get into her home and that she needs ambulance transportation home. PTAR forms completed and sent to unit. PTAR arranged for next available transport. HH services arranged through [facility] with [facility], patient last used their services in April 2025. Contact information placed on AVS.   1130: Message from floor nurse regarding pt refusal to go with PTAR and having rude behavior towards them.  Another floor nurse responded that patient now wants to go home with PTAR. Call placed to arrange PTAR transportation, dispatcher will have PTAR call if they will not transport patient home.   Final next level of care: Home w Home Health Services Barriers to Discharge: No Barriers Identified   Patient Goals and CMS Choice Patient states their goals for this hospitalization and ongoing recovery are:: to return home          Discharge Placement                       Discharge Plan and Services Additional resources added to the After Visit Summary for   In-house Referral: Clinical Social Work                        HH Arranged: PT, OT, Speech Therapy, Nurse's Aide HH Agency: [facility] Date HH Agency Contacted: 08/05/23 Time HH Agency Contacted: 0911 Representative spoke with at [facility] Agency: [place]  Social Drivers of Health (SDOH) Interventions SDOH Screenings   Food Insecurity: Food Insecurity Present (08/02/2023)  Housing: Low Risk  (07/29/2023)  Transportation Needs: No Transportation Needs (07/29/2023)  Utilities: Not At Risk (07/29/2023)  Alcohol Screen: Low Risk  (11/10/2022)  Depression (PHQ2-9): Low Risk  (07/11/2022)  Financial Resource  Strain: Low Risk  (07/11/2022)  Physical Activity: Inactive (07/11/2022)  Social Connections: Socially Isolated (06/28/2023)  Stress: Stress Concern Present (07/11/2022)  Tobacco Use: Medium Risk (07/27/2023)     Readmission Risk Interventions    06/29/2023    3:13 PM 06/05/2022    2:47 PM 02/07/2022    2:22 PM  Readmission Risk Prevention Plan  Transportation Screening Complete Complete Complete  Medication Review (RN Care Manager) Referral to Pharmacy Referral to Pharmacy Complete  PCP or Specialist appointment within 3-5 days of discharge Complete Complete Complete  HRI or Home Care Consult Complete Complete Complete  SW Recovery Care/Counseling Consult Complete Complete Complete  Palliative Care Screening Not Applicable Not Applicable Not Applicable  Skilled Nursing Facility Not Applicable Not Applicable Not Applicable

## 2023-08-05 NOTE — Progress Notes (Signed)
 [facility] Progress Note   Subjective:    She had 2 liter UOP over 7/19.  [name] going to be discharged today per charting and she is comfortable with this.  She's waiting on her things from security.   Review of systems:   Denies shortness of breath Denies n/v Denies chest pain   Objective Vitals:   08/04/23 2117 08/05/23 0602 08/05/23 0759 08/05/23 0849  BP: 95/63 133/79 109/63   Pulse: 85 78 85   Resp: 20 20 20 18   Temp: 98.8 F (37.1 C) 98.1 F (36.7 C) 98.1 F (36.7 C)   TempSrc: Oral Oral Oral   SpO2: 99% 100% 93% 94%  Weight:  86.5 kg    Height:       Physical Exam   General adult female in bed in no acute distress HEENT normocephalic atraumatic extraocular movements intact sclera anicteric Neck supple trachea midline Lungs clear to auscultation bilaterally normal work of breathing at rest on room air  Heart S1S2 no rub Abdomen soft nontender nondistended Extremities no edema  Psych normal mood and affect Neuro - awake and conversant and follows commands Access: RIJ tunneled catheter in place      Additional Objective Labs: Basic Metabolic Panel: Recent Labs  Lab 08/02/23 0527 08/03/23 0419 08/04/23 0525  NA 137 133* 132*  K 4.0 4.3 4.1  CL 100 96* 94*  CO2 26 27 27   GLUCOSE 98 94 99  BUN 42* 29* 49*  CREATININE 4.39* 3.65* 4.91*  CALCIUM  9.8 9.6 9.2  PHOS 4.5 3.9 5.1*   Liver Function Tests: Recent Labs  Lab 08/02/23 0527 08/03/23 0419 08/04/23 0525  ALBUMIN  3.0* 2.7* 2.8*   No results for input(s): LIPASE, AMYLASE in the last 168 hours. CBC: Recent Labs  Lab 07/29/23 1728 07/30/23 0403 07/31/23 1401 08/01/23 0455 08/01/23 0839 08/02/23 0527 08/03/23 0419 08/04/23 0525  WBC 5.9   < > 6.1 7.3  --  6.9 6.7 7.5  NEUTROABS 4.1  --   --   --   --   --   --   --   HGB 7.0*   < > 7.2* 6.9*   < > 8.7* 7.7* 7.3*  HCT 22.4*   < > 23.4* 22.0*   < > 28.5* 25.5* 24.1*  MCV 93.7   < > 94.4 94.4  --  94.4 94.8 96.0  PLT  331   < > 343 264  --  345 301 294   < > = values in this interval not displayed.   Blood Culture    Component Value Date/Time   SDES BLOOD RIGHT HAND 07/31/2023 1402   SPECREQUEST  07/31/2023 1402    BOTTLES DRAWN AEROBIC ONLY Blood Culture results may not be optimal due to an inadequate volume of blood received in culture bottles   CULT  07/31/2023 1402    NO GROWTH 5 DAYS Performed at [facility], [address] [place], [place] [address]    REPTSTATUS 08/05/2023 FINAL 07/31/2023 1402    Cardiac Enzymes: No results for input(s): CKTOTAL, CKMB, CKMBINDEX, TROPONINI in the last 168 hours. CBG: No results for input(s): GLUCAP in the last 168 hours. Iron  Studies:  No results for input(s): IRON , TIBC, TRANSFERRIN, FERRITIN in the last 72 hours.  @lablastinr3 @ Studies/Results: No results found.  Medications:     sodium chloride    Intravenous Once   atorvastatin   20 mg Oral Daily   budesonide -glycopyrrolate -formoterol   2 puff Inhalation BID  calcitRIOL   0.5 mcg Oral Daily   carvedilol   12.5 mg Oral BID   Chlorhexidine  Gluconate Cloth  6 each Topical Q0600   dicyclomine   10 mg Oral TID   DULoxetine   30 mg Oral Daily   escitalopram   10 mg Oral Daily   ezetimibe   10 mg Oral Daily   gabapentin   100 mg Oral QHS   hydrOXYzine   10 mg Oral TID   insulin  aspart  0-6 Units Subcutaneous TID WC   lamoTRIgine   100 mg Oral QHS   lidocaine   1 patch Transdermal Q24H   mirtazapine   15 mg Oral QHS   pantoprazole   40 mg Oral Daily   QUEtiapine   50 mg Oral BID   torsemide   40 mg Oral BID    OUtpt HD:  TTS [facility] 4hrs EDW 82kg BFR 500 DFR 1.5 2K/2Ca No heparin , ESA, VDRA per orders.   Assessment/Plan   **Chest pain s/p fall:  sounds mechanical, no concern for ACS, per primary.    **HTN:  BP acceptable.  Has anti-HTN's on home med list - unsure of compliance.  Optimize volume with HD - would continue current regimen on discharge as she has been stable on  same   **ESRD on HD:  - HD per TTS schedule.  She's getting HD now on first shift on 7/19 as team states that she will be discharged home after HD  - Thankfully, she's going to outpatient HD with some more regularity now.  Encouraged her   **Anemia of CKD:  PRBC's per primary team.  Iron  fine 6/14. Rec'd aranesp  100 mcg on 7/12.  ordered for weekly on Fridays.  Increased to 150 mcg which she last got on 7/19   **BMM:  continue outpt binder; note concern for noncompliance.  on calcitriol .   **Hypokalemia: using added K bath.  Improved   **Staph in blood cultures  - this was a contaminant - repeat blood cultures 7/15 are no growth to date and the other 7/12 culture is negative (final).       Disposition - per primary team     Katheryn JAYSON Saba, MD 10:03 AM 08/05/2023

## 2023-08-06 NOTE — TOC Transition Note (Signed)
 Transition of Care - Initial Contact after Hospitalization  Date of discharge: 08/05/2023  Date of contact: 08/06/23  Method: Phone Spoke to: Patient  Patient contacted to discuss transition of care from recent inpatient hospitalization. Patient was admitted to [facility] from 07/27/23 to 08/05/23 with discharge diagnosis of: Left-sided chest wall pain [R07.89] Hypotension [I95.9] Hypotension, unspecified hypotension type [I95.9] ESRD (end stage renal disease) (HCC) [N18.6] .  The discharge medication list was reviewed. Patient understands the changes and has no concerns.   Patient will return to her outpatient HD unit on Tuesday July 22nd at [facility].  Charmaine Piety, NP

## 2023-08-06 NOTE — Progress Notes (Signed)
 Late Note entry- August 06, 2023  Pt was d/c yesterday. Contacted [facility] this morning to be advised of pt's d/c date and that pt should resume care tomorrow.   Randine Mungo Renal Navigator [phone]

## 2023-08-13 ENCOUNTER — Emergency Department (HOSPITAL_COMMUNITY)

## 2023-08-13 ENCOUNTER — Encounter (HOSPITAL_COMMUNITY): Payer: Self-pay

## 2023-08-13 ENCOUNTER — Emergency Department (HOSPITAL_COMMUNITY)
Admission: EM | Admit: 2023-08-13 | Discharge: 2023-08-15 | Disposition: A | Discharge status: Home / Self Care | Attending: Emergency Medicine | Admitting: Emergency Medicine

## 2023-08-13 DIAGNOSIS — N186 End stage renal disease: Secondary | ICD-10-CM | POA: Diagnosis not present

## 2023-08-13 DIAGNOSIS — S0990XA Unspecified injury of head, initial encounter: Secondary | ICD-10-CM | POA: Insufficient documentation

## 2023-08-13 DIAGNOSIS — W050XXA Fall from non-moving wheelchair, initial encounter: Secondary | ICD-10-CM | POA: Diagnosis not present

## 2023-08-13 DIAGNOSIS — F22 Delusional disorders: Secondary | ICD-10-CM | POA: Diagnosis not present

## 2023-08-13 DIAGNOSIS — Z992 Dependence on renal dialysis: Secondary | ICD-10-CM | POA: Diagnosis not present

## 2023-08-13 DIAGNOSIS — W19XXXA Unspecified fall, initial encounter: Secondary | ICD-10-CM

## 2023-08-13 DIAGNOSIS — Z87891 Personal history of nicotine dependence: Secondary | ICD-10-CM | POA: Insufficient documentation

## 2023-08-13 DIAGNOSIS — Z91010 Allergy to peanuts: Secondary | ICD-10-CM | POA: Insufficient documentation

## 2023-08-13 DIAGNOSIS — F250 Schizoaffective disorder, bipolar type: Secondary | ICD-10-CM | POA: Diagnosis not present

## 2023-08-13 DIAGNOSIS — F259 Schizoaffective disorder, unspecified: Secondary | ICD-10-CM | POA: Diagnosis present

## 2023-08-13 DIAGNOSIS — R600 Localized edema: Secondary | ICD-10-CM | POA: Insufficient documentation

## 2023-08-13 DIAGNOSIS — M7989 Other specified soft tissue disorders: Secondary | ICD-10-CM | POA: Diagnosis not present

## 2023-08-13 LAB — CBC
HCT: 26.5 % — ABNORMAL LOW (ref 36.0–46.0)
Hemoglobin: 8.4 g/dL — ABNORMAL LOW (ref 12.0–15.0)
MCH: 29.2 pg (ref 26.0–34.0)
MCHC: 31.7 g/dL (ref 30.0–36.0)
MCV: 92 fL (ref 80.0–100.0)
Platelets: 441 K/uL — ABNORMAL HIGH (ref 150–400)
RBC: 2.88 MIL/uL — ABNORMAL LOW (ref 3.87–5.11)
RDW: 15.9 % — ABNORMAL HIGH (ref 11.5–15.5)
WBC: 10.5 K/uL (ref 4.0–10.5)
nRBC: 0 % (ref 0.0–0.2)

## 2023-08-13 LAB — BASIC METABOLIC PANEL WITH GFR
Anion gap: 19 — ABNORMAL HIGH (ref 5–15)
BUN: 71 mg/dL — ABNORMAL HIGH (ref 8–23)
CO2: 23 mmol/L (ref 22–32)
Calcium: 9.4 mg/dL (ref 8.9–10.3)
Chloride: 92 mmol/L — ABNORMAL LOW (ref 98–111)
Creatinine, Ser: 7.95 mg/dL — ABNORMAL HIGH (ref 0.44–1.00)
GFR, Estimated: 5 mL/min — ABNORMAL LOW (ref >60)
Glucose, Bld: 70 mg/dL (ref 70–99)
Potassium: 2.9 mmol/L — ABNORMAL LOW (ref 3.5–5.1)
Sodium: 134 mmol/L — ABNORMAL LOW (ref 135–145)
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
## 2023-08-13 NOTE — ED Notes (Signed)
 Pt came out of room stating she wants to talk to someone and that she wants to hurt herself she states I have so much on my mind and I can't turn it off  Pt is crying  Doctor was notified.

## 2023-08-13 NOTE — ED Notes (Incomplete)
 ED phlebotomy and NT at bedside.
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
## 2023-08-13 NOTE — ED Notes (Signed)
 Assisted patient to get cleaned.  Pt placed in some clean paper scrubs and a diaper placed on patient.  PT asked for seat of her wheelchair to be cleaned.  Cleaned it and then placed a pad on the bottom for her.

## 2023-08-14 DIAGNOSIS — F22 Delusional disorders: Secondary | ICD-10-CM

## 2023-08-14 DIAGNOSIS — F250 Schizoaffective disorder, bipolar type: Secondary | ICD-10-CM

## 2023-08-14 MED ORDER — ALBUTEROL SULFATE HFA 108 (90 BASE) MCG/ACT IN AERS: 2.0000 | INHALATION_SPRAY | Freq: Four times a day (QID) | RESPIRATORY_TRACT | Status: DC | PRN

## 2023-08-14 MED ORDER — ESCITALOPRAM OXALATE 10 MG PO TABS: 5.0000 mg | ORAL_TABLET | Freq: Every day | ORAL | Status: DC

## 2023-08-14 MED ORDER — ESCITALOPRAM OXALATE 10 MG PO TABS
5.0000 mg | ORAL_TABLET | Freq: Every day | ORAL | Status: DC
  Administered 2023-08-15: 5 mg via ORAL
  Filled 2023-08-14: qty 1

## 2023-08-14 MED ORDER — DICYCLOMINE HCL 10 MG PO CAPS
10.0000 mg | ORAL_CAPSULE | Freq: Three times a day (TID) | ORAL | Status: DC
  Administered 2023-08-14 – 2023-08-15 (×5): 10 mg via ORAL
  Filled 2023-08-14 (×5): qty 1

## 2023-08-14 MED ORDER — LAMOTRIGINE 25 MG PO TABS: 100.0000 mg | ORAL_TABLET | Freq: Every day | ORAL | Status: DC

## 2023-08-14 MED ORDER — CARVEDILOL 12.5 MG PO TABS
12.5000 mg | ORAL_TABLET | Freq: Two times a day (BID) | ORAL | Status: DC
  Administered 2023-08-14 – 2023-08-15 (×3): 12.5 mg via ORAL
  Filled 2023-08-14 (×3): qty 1

## 2023-08-14 MED ORDER — MIRTAZAPINE 15 MG PO TABS: 15.0000 mg | ORAL_TABLET | Freq: Every day | ORAL | Status: DC

## 2023-08-14 MED ORDER — AMLODIPINE BESYLATE 5 MG PO TABS
5.0000 mg | ORAL_TABLET | Freq: Two times a day (BID) | ORAL | Status: DC
  Administered 2023-08-14 – 2023-08-15 (×2): 5 mg via ORAL
  Filled 2023-08-14 (×3): qty 1

## 2023-08-14 MED ORDER — PRAZOSIN HCL 2 MG PO CAPS
2.0000 mg | ORAL_CAPSULE | Freq: Every day | ORAL | Status: DC
  Filled 2023-08-14: qty 1

## 2023-08-14 MED ORDER — GABAPENTIN 100 MG PO CAPS
100.0000 mg | ORAL_CAPSULE | Freq: Every day | ORAL | Status: DC
  Administered 2023-08-14: 100 mg via ORAL
  Filled 2023-08-14: qty 1

## 2023-08-14 MED ORDER — OLANZAPINE 5 MG PO TABS
5.0000 mg | ORAL_TABLET | Freq: Every day | ORAL | Status: DC
  Administered 2023-08-14: 5 mg via ORAL
  Filled 2023-08-14: qty 1

## 2023-08-14 MED ORDER — OLANZAPINE 2.5 MG PO TABS
2.5000 mg | ORAL_TABLET | Freq: Every day | ORAL | Status: DC
  Administered 2023-08-14 – 2023-08-15 (×2): 2.5 mg via ORAL
  Filled 2023-08-14 (×2): qty 1

## 2023-08-14 MED ORDER — HYDROXYZINE HCL 10 MG PO TABS
10.0000 mg | ORAL_TABLET | Freq: Three times a day (TID) | ORAL | Status: DC | PRN
  Administered 2023-08-14: 10 mg via ORAL
  Filled 2023-08-14: qty 1

## 2023-08-14 MED ORDER — DULOXETINE HCL 30 MG PO CPEP
30.0000 mg | ORAL_CAPSULE | Freq: Every day | ORAL | Status: DC
  Administered 2023-08-14: 30 mg via ORAL
  Filled 2023-08-14: qty 1

## 2023-08-14 MED ORDER — ATORVASTATIN CALCIUM 10 MG PO TABS
20.0000 mg | ORAL_TABLET | Freq: Every day | ORAL | Status: DC
  Administered 2023-08-14 – 2023-08-15 (×2): 20 mg via ORAL
  Filled 2023-08-14: qty 1
  Filled 2023-08-14: qty 2

## 2023-08-14 MED ORDER — QUETIAPINE FUMARATE 25 MG PO TABS
50.0000 mg | ORAL_TABLET | Freq: Two times a day (BID) | ORAL | Status: DC
  Administered 2023-08-14: 50 mg via ORAL
  Filled 2023-08-14: qty 2

## 2023-08-14 MED ORDER — TORSEMIDE 20 MG PO TABS
40.0000 mg | ORAL_TABLET | Freq: Two times a day (BID) | ORAL | Status: DC
  Administered 2023-08-14 – 2023-08-15 (×2): 40 mg via ORAL
  Filled 2023-08-14 (×2): qty 2

## 2023-08-14 NOTE — ED Notes (Signed)
 PTAR called, ETA on the way

## 2023-08-14 NOTE — ED Notes (Addendum)
 Pt meds sent t o Pharmacy for safe keeping. 28 bottles included. No narcotics. 5 empty bottles were there. They are Hydromorphone  2mg , hydroxizine, loperamide , cyclobenzaprine  and carvedilol .. It should also be noted that several of the pill bottles stored in Pharmacy had multiple different types of pills in the same bottle.

## 2023-08-14 NOTE — Progress Notes (Signed)
 LCSW Progress Note  [id number]   Tiffany [name]  08/14/2023  2:21 PM  Description:   Inpatient Psychiatric Referral  Patient was recommended inpatient per Elveria Batter NP. There are no available beds at [facility], per [facility] Cherylynn Ernst RN. Patient was referred to the following out of network facilities:   [facility] Provider Address Phone Fax  [facility]  [address] [place] [address] [phone]  [facility]  [address] [place], [place] [address] [phone]  [facility]  [address] [place] [address] [phone]  [facility]  [address], [place] [address] [phone]  [facility]  [address], [place] [address] [phone]  [facility]  [address] [place] [address] [phone]  [facility] EFAX  [address] [place], [place] [phone]  [facility]  [address], [place] [address] [phone]  [facility]  [address] [place] [address] [phone]  [facility]  [address], [place] [address] [phone]      Situation ongoing, [name] to continue following and update chart as more information becomes available.     [place] Diontae Route, MSW, LCSW  08/14/2023 2:21 PM
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
## 2023-08-14 NOTE — Progress Notes (Signed)
 Inpatient Psychiatric Referral  Patient was recommended inpatient per Elveria Batter, NP. There are no available beds at [facility], per [facility] Luke Sprang, RN. Patient was referred to the following out of network facilities:  Destination  Service Provider Request Status Address Phone Fax  [facility]  Pending - Request Sent [address] [place] [address] [phone]  [facility]  Pending - Request Sent [address] [place] [address] [phone]  [facility]  Pending - Request Sent [address] [place] [address] [phone]  [facility]  Pending - Request Sent [address] [place] [address] [phone]  [facility]  Pending - Request Sent [address] [place] [address] [phone]  [facility]  Pending - Request Sent [address], [place] [address] [phone]  [facility]  Pending - Request Sent [address] [place] [phone]  [facility]  Pending - Request Sent [address], [place] [address] [phone]  [facility]  Pending - Request Sent [address] [place] [address] [phone]  [facility]  Pending - Request Sent [address], [place] [address] [phone]    Situation ongoing, [name] to continue following and update chart as more information becomes available.  Harrie Sofia MSW, LCSWA 08/14/2023  7:44PM
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
## 2023-08-14 NOTE — Discharge Instructions (Addendum)
 Your CT exam this evening was reassuring.  Follow-up with your primary care team to discuss your concerns about your current dialysis center and check on possibility of transferring to a different center.  Return to the emergency department if you develop any life-threatening symptoms.

## 2023-08-14 NOTE — ED Notes (Signed)
 Pt becoming agitated. Anxious, talking and arguing in the room at staff. Pt demanding medication for anxiety when pt was already medicated.
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
## 2023-08-14 NOTE — ED Notes (Signed)
 PTAR cancelled due to psych evaluation necessity
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
## 2023-08-14 NOTE — ED Notes (Signed)
 Pt states I'm angry, I'm upset, no one is telling me what I'm here for. No one wants to keep me informed on MY care. Everyone is talking about me and laughing at me

## 2023-08-15 ENCOUNTER — Inpatient Hospital Stay
Admission: AD | Admit: 2023-08-15 | Discharge: 2023-09-07 | DRG: 885 | Disposition: A | Discharge status: Home / Self Care | Source: Intra-hospital | Attending: Psychiatry | Admitting: Psychiatry

## 2023-08-15 ENCOUNTER — Other Ambulatory Visit: Payer: Self-pay

## 2023-08-15 ENCOUNTER — Encounter: Payer: Self-pay | Admitting: Psychiatry

## 2023-08-15 DIAGNOSIS — D631 Anemia in chronic kidney disease: Secondary | ICD-10-CM | POA: Diagnosis present

## 2023-08-15 DIAGNOSIS — E1165 Type 2 diabetes mellitus with hyperglycemia: Secondary | ICD-10-CM | POA: Diagnosis present

## 2023-08-15 DIAGNOSIS — Z818 Family history of other mental and behavioral disorders: Secondary | ICD-10-CM | POA: Diagnosis not present

## 2023-08-15 DIAGNOSIS — Z992 Dependence on renal dialysis: Secondary | ICD-10-CM

## 2023-08-15 DIAGNOSIS — S0990XA Unspecified injury of head, initial encounter: Secondary | ICD-10-CM | POA: Diagnosis not present

## 2023-08-15 DIAGNOSIS — E1140 Type 2 diabetes mellitus with diabetic neuropathy, unspecified: Secondary | ICD-10-CM | POA: Diagnosis present

## 2023-08-15 DIAGNOSIS — E785 Hyperlipidemia, unspecified: Secondary | ICD-10-CM | POA: Diagnosis present

## 2023-08-15 DIAGNOSIS — Z5941 Food insecurity: Secondary | ICD-10-CM | POA: Diagnosis not present

## 2023-08-15 DIAGNOSIS — Z882 Allergy status to sulfonamides status: Secondary | ICD-10-CM

## 2023-08-15 DIAGNOSIS — Z885 Allergy status to narcotic agent status: Secondary | ICD-10-CM

## 2023-08-15 DIAGNOSIS — I5032 Chronic diastolic (congestive) heart failure: Secondary | ICD-10-CM | POA: Diagnosis present

## 2023-08-15 DIAGNOSIS — K219 Gastro-esophageal reflux disease without esophagitis: Secondary | ICD-10-CM | POA: Diagnosis present

## 2023-08-15 DIAGNOSIS — J439 Emphysema, unspecified: Secondary | ICD-10-CM | POA: Diagnosis present

## 2023-08-15 DIAGNOSIS — F250 Schizoaffective disorder, bipolar type: Principal | ICD-10-CM | POA: Diagnosis present

## 2023-08-15 DIAGNOSIS — Z91158 Patient's noncompliance with renal dialysis for other reason: Secondary | ICD-10-CM

## 2023-08-15 DIAGNOSIS — E1122 Type 2 diabetes mellitus with diabetic chronic kidney disease: Secondary | ICD-10-CM | POA: Diagnosis present

## 2023-08-15 DIAGNOSIS — R252 Cramp and spasm: Secondary | ICD-10-CM | POA: Diagnosis not present

## 2023-08-15 DIAGNOSIS — N2581 Secondary hyperparathyroidism of renal origin: Secondary | ICD-10-CM | POA: Diagnosis present

## 2023-08-15 DIAGNOSIS — I252 Old myocardial infarction: Secondary | ICD-10-CM | POA: Diagnosis not present

## 2023-08-15 DIAGNOSIS — Z1152 Encounter for screening for COVID-19: Secondary | ICD-10-CM | POA: Diagnosis not present

## 2023-08-15 DIAGNOSIS — N186 End stage renal disease: Secondary | ICD-10-CM | POA: Diagnosis present

## 2023-08-15 DIAGNOSIS — F4312 Post-traumatic stress disorder, chronic: Secondary | ICD-10-CM | POA: Diagnosis present

## 2023-08-15 DIAGNOSIS — Z91010 Allergy to peanuts: Secondary | ICD-10-CM

## 2023-08-15 DIAGNOSIS — J4489 Other specified chronic obstructive pulmonary disease: Secondary | ICD-10-CM | POA: Diagnosis present

## 2023-08-15 DIAGNOSIS — Z8249 Family history of ischemic heart disease and other diseases of the circulatory system: Secondary | ICD-10-CM

## 2023-08-15 DIAGNOSIS — I132 Hypertensive heart and chronic kidney disease with heart failure and with stage 5 chronic kidney disease, or end stage renal disease: Secondary | ICD-10-CM | POA: Diagnosis present

## 2023-08-15 DIAGNOSIS — G894 Chronic pain syndrome: Secondary | ICD-10-CM | POA: Diagnosis present

## 2023-08-15 DIAGNOSIS — Z886 Allergy status to analgesic agent status: Secondary | ICD-10-CM

## 2023-08-15 DIAGNOSIS — Z87891 Personal history of nicotine dependence: Secondary | ICD-10-CM

## 2023-08-15 DIAGNOSIS — Z888 Allergy status to other drugs, medicaments and biological substances status: Secondary | ICD-10-CM

## 2023-08-15 DIAGNOSIS — F259 Schizoaffective disorder, unspecified: Principal | ICD-10-CM | POA: Diagnosis present

## 2023-08-15 DIAGNOSIS — Z8673 Personal history of transient ischemic attack (TIA), and cerebral infarction without residual deficits: Secondary | ICD-10-CM

## 2023-08-15 DIAGNOSIS — F22 Delusional disorders: Secondary | ICD-10-CM | POA: Diagnosis present

## 2023-08-15 DIAGNOSIS — Z79899 Other long term (current) drug therapy: Secondary | ICD-10-CM

## 2023-08-15 DIAGNOSIS — Z801 Family history of malignant neoplasm of trachea, bronchus and lung: Secondary | ICD-10-CM

## 2023-08-15 DIAGNOSIS — Z8042 Family history of malignant neoplasm of prostate: Secondary | ICD-10-CM

## 2023-08-15 LAB — BASIC METABOLIC PANEL WITH GFR
Anion gap: 15 (ref 5–15)
BUN: 77 mg/dL — ABNORMAL HIGH (ref 8–23)
CO2: 26 mmol/L (ref 22–32)
Calcium: 8.6 mg/dL — ABNORMAL LOW (ref 8.9–10.3)
Chloride: 90 mmol/L — ABNORMAL LOW (ref 98–111)
Creatinine, Ser: 7.75 mg/dL — ABNORMAL HIGH (ref 0.44–1.00)
GFR, Estimated: 5 mL/min — ABNORMAL LOW (ref >60)
Glucose, Bld: 89 mg/dL (ref 70–99)
Potassium: 2.7 mmol/L — CL (ref 3.5–5.1)
Sodium: 131 mmol/L — ABNORMAL LOW (ref 135–145)

## 2023-08-15 LAB — SARS CORONAVIRUS 2 BY RT PCR: SARS Coronavirus 2 by RT PCR: NEGATIVE

## 2023-08-15 MED ORDER — OLANZAPINE 10 MG IM SOLR: 5.0000 mg | Freq: Once | INTRAMUSCULAR | Status: AC

## 2023-08-15 MED ORDER — GABAPENTIN 100 MG PO CAPS
100.0000 mg | ORAL_CAPSULE | Freq: Every day | ORAL | Status: DC
  Administered 2023-08-15 – 2023-09-06 (×26): 100 mg via ORAL
  Filled 2023-08-15 (×24): qty 1

## 2023-08-15 MED ORDER — TORSEMIDE 20 MG PO TABS
40.0000 mg | ORAL_TABLET | Freq: Two times a day (BID) | ORAL | Status: DC
  Administered 2023-08-15 – 2023-09-07 (×45): 40 mg via ORAL
  Filled 2023-08-15 (×50): qty 2

## 2023-08-15 MED ORDER — ESCITALOPRAM OXALATE 10 MG PO TABS
5.0000 mg | ORAL_TABLET | Freq: Every day | ORAL | Status: DC
  Administered 2023-08-16 – 2023-08-31 (×19): 5 mg via ORAL
  Filled 2023-08-15 (×16): qty 1

## 2023-08-15 MED ORDER — CARVEDILOL 6.25 MG PO TABS
12.5000 mg | ORAL_TABLET | Freq: Two times a day (BID) | ORAL | Status: DC
  Administered 2023-08-16 – 2023-09-07 (×46): 12.5 mg via ORAL
  Filled 2023-08-15 (×43): qty 2

## 2023-08-15 MED ORDER — OLANZAPINE 10 MG IM SOLR: 5.0000 mg | Freq: Once | INTRAMUSCULAR | Status: DC

## 2023-08-15 MED ORDER — OLANZAPINE 5 MG PO TBDP
5.0000 mg | ORAL_TABLET | Freq: Three times a day (TID) | ORAL | Status: DC | PRN
Start: 2023-08-15 — End: 2023-09-07
  Administered 2023-08-18: 5 mg via ORAL
  Filled 2023-08-15: qty 1

## 2023-08-15 MED ORDER — OLANZAPINE 5 MG PO TABS
2.5000 mg | ORAL_TABLET | Freq: Every day | ORAL | Status: DC
  Administered 2023-08-16 – 2023-08-17 (×2): 2.5 mg via ORAL
  Filled 2023-08-15 (×2): qty 1

## 2023-08-15 MED ORDER — AMLODIPINE BESYLATE 5 MG PO TABS
5.0000 mg | ORAL_TABLET | Freq: Two times a day (BID) | ORAL | Status: DC
  Administered 2023-08-16 – 2023-09-07 (×33): 5 mg via ORAL
  Filled 2023-08-15 (×42): qty 1

## 2023-08-15 MED ORDER — DICYCLOMINE HCL 10 MG PO CAPS
10.0000 mg | ORAL_CAPSULE | Freq: Three times a day (TID) | ORAL | Status: DC
  Administered 2023-08-15 – 2023-09-06 (×70): 10 mg via ORAL
  Filled 2023-08-15 (×72): qty 1

## 2023-08-15 MED ORDER — POTASSIUM CHLORIDE CRYS ER 20 MEQ PO TBCR
40.0000 meq | EXTENDED_RELEASE_TABLET | ORAL | Status: AC
  Administered 2023-08-15: 40 meq via ORAL
  Filled 2023-08-15: qty 2

## 2023-08-15 MED ORDER — ATORVASTATIN CALCIUM 10 MG PO TABS
20.0000 mg | ORAL_TABLET | Freq: Every day | ORAL | Status: DC
  Administered 2023-08-16 – 2023-09-07 (×26): 20 mg via ORAL
  Filled 2023-08-15 (×23): qty 2

## 2023-08-15 MED ORDER — MELATONIN 5 MG PO TABS
5.0000 mg | ORAL_TABLET | Freq: Every day | ORAL | Status: DC
  Administered 2023-08-15 – 2023-08-30 (×19): 5 mg via ORAL
  Filled 2023-08-15 (×19): qty 1

## 2023-08-15 MED ORDER — LIDOCAINE 5 % EX PTCH
1.0000 | MEDICATED_PATCH | Freq: Once | CUTANEOUS | Status: DC
  Administered 2023-08-15: 1 via TRANSDERMAL
  Filled 2023-08-15: qty 1

## 2023-08-15 MED ORDER — ALBUTEROL SULFATE HFA 108 (90 BASE) MCG/ACT IN AERS
2.0000 | INHALATION_SPRAY | Freq: Four times a day (QID) | RESPIRATORY_TRACT | Status: DC | PRN
  Administered 2023-08-17 – 2023-09-06 (×24): 2 via RESPIRATORY_TRACT
  Filled 2023-08-15 (×2): qty 6.7

## 2023-08-15 MED ORDER — OLANZAPINE 10 MG IM SOLR
5.0000 mg | Freq: Three times a day (TID) | INTRAMUSCULAR | Status: DC | PRN
  Filled 2023-08-15: qty 10

## 2023-08-15 MED ORDER — OLANZAPINE 5 MG PO TABS
5.0000 mg | ORAL_TABLET | Freq: Every day | ORAL | Status: DC
  Administered 2023-08-15 – 2023-08-17 (×3): 5 mg via ORAL
  Filled 2023-08-15 (×3): qty 1

## 2023-08-15 MED ORDER — HYDROXYZINE HCL 10 MG PO TABS
10.0000 mg | ORAL_TABLET | Freq: Three times a day (TID) | ORAL | Status: DC | PRN
  Administered 2023-08-15 – 2023-08-20 (×3): 10 mg via ORAL
  Filled 2023-08-15 (×3): qty 1

## 2023-08-15 MED ORDER — OLANZAPINE 5 MG PO TABS
5.0000 mg | ORAL_TABLET | Freq: Once | ORAL | Status: AC
  Administered 2023-08-15: 5 mg via ORAL
  Filled 2023-08-15: qty 1

## 2023-08-15 MED ORDER — HALOPERIDOL LACTATE 5 MG/ML IJ SOLN
5.0000 mg | Freq: Once | INTRAMUSCULAR | Status: AC
  Administered 2023-08-15: 5 mg via INTRAMUSCULAR
  Filled 2023-08-15: qty 1

## 2023-08-15 NOTE — ED Notes (Signed)
VOL at this time

## 2023-08-15 NOTE — ED Provider Notes (Signed)
 Emergency Medicine Observation Re-evaluation Note  Tiffany [name] is a 63 y.o. female, seen on rounds today.  Pt initially presented to the ED for complaints of No chief complaint on file. Currently, the patient is resting.  Physical Exam  BP 121/80   Pulse 60   Temp 97.7 F (36.5 C) (Oral)   Resp 15   SpO2 97%  Physical Exam General: nad Lungs: no hypoxia Psych: resting  ED Course / MDM  EKG:   I have reviewed the labs performed to date as well as medications administered while in observation.  Recent changes in the last 24 hours include intermittently agitated, anxious, argument staff.,  Paranoid  Plan  Current plan is for placement.    Elnor Jayson LABOR, DO 08/15/23 [phone]

## 2023-08-15 NOTE — ED Notes (Signed)
 Pt given supplies for bath and pt bathed herself. Full linen change. Pt provided deodorant, toothbrush and toothpaste.
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
## 2023-08-15 NOTE — ED Notes (Signed)
 Pt refused to have vitals taken prior to d/c.

## 2023-08-15 NOTE — Progress Notes (Signed)
   08/15/23 1915  Psych Admission Type (Psych Patients Only)  Admission Status Voluntary  Psychosocial Assessment  Patient Complaints None  Eye Contact Brief  Facial Expression Animated  Affect Blunted  Speech Logical/coherent  Interaction Needy;Assertive  Motor Activity Unsteady  Appearance/Hygiene In scrubs;Body odor  Behavior Characteristics Cooperative  Mood Sullen  Thought Process  Coherency Concrete thinking  Content Paranoia  Delusions None reported or observed  Perception WDL  Hallucination None reported or observed  Judgment Limited  Confusion Mild  Danger to Self  Current suicidal ideation? Denies

## 2023-08-15 NOTE — Plan of Care (Signed)
  Problem: Activity: Goal: Will verbalize the importance of balancing activity with adequate rest periods Outcome: Not Progressing   Problem: Education: Goal: Will be free of psychotic symptoms Outcome: Not Progressing Goal: Knowledge of the prescribed therapeutic regimen will improve Outcome: Not Progressing   Problem: Coping: Goal: Coping ability will improve Outcome: Not Progressing Goal: Will verbalize feelings Outcome: Not Progressing   Problem: Health Behavior/Discharge Planning: Goal: Compliance with prescribed medication regimen will improve Outcome: Not Progressing   Problem: Nutritional: Goal: Ability to achieve adequate nutritional intake will improve Outcome: Not Progressing   Problem: Role Relationship: Goal: Ability to communicate needs accurately will improve Outcome: Not Progressing Goal: Ability to interact with others will improve Outcome: Not Progressing   Problem: Safety: Goal: Ability to redirect hostility and anger into socially appropriate behaviors will improve Outcome: Not Progressing Goal: Ability to remain free from injury will improve Outcome: Not Progressing   Problem: Self-Care: Goal: Ability to participate in self-care as condition permits will improve Outcome: Not Progressing   Problem: Self-Concept: Goal: Will verbalize positive feelings about self Outcome: Not Progressing

## 2023-08-15 NOTE — Progress Notes (Signed)
 Pt was accepted to [facility] 08/15/2023 Bed Assignment 30  Address: [address] [place], [place], [place] [address]  [facility] Fax: [phone]  Pt meets inpatient criteria per: Elveria Batter    Attending Physician will be Dr. Allyn Donnelly COME  Report can be called to: [phone]  Pt can arrive after [facility] WILL UPDATE   Care Team notified: Cherylynn Ernst RN, Jerel Gravely NP, [place] Wright RN    [place] Arden Tinoco, MSW, [facility] 08/15/2023 12:49 PM

## 2023-08-15 NOTE — Tx Team (Signed)
 Initial Treatment Plan 08/15/2023 7:25 PM [name] FMW:[id number]    PATIENT STRESSORS: Marital or family conflict     PATIENT STRENGTHS: Communication skills    PATIENT IDENTIFIED PROBLEMS:   Paranoia                    DISCHARGE CRITERIA:  Ability to meet basic life and health needs Adequate post-discharge living arrangements Improved stabilization in mood, thinking, and/or behavior  PRELIMINARY DISCHARGE PLAN: Return to previous living arrangement  PATIENT/FAMILY INVOLVEMENT: This treatment plan has been presented to and reviewed with the patient, Tiffany [name]. The patient has been given the opportunity to ask questions and make suggestions.  Benton littie Gains, RN 08/15/2023, 7:25 PM

## 2023-08-15 NOTE — ED Notes (Signed)
Pt refused to have vital signs taken at this time.
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
## 2023-08-15 NOTE — ED Notes (Signed)
 Pt given all belongings from security office, safe, and locker. Pt signed belongings forms when receiving sealed envelopes. After looking through belongings pt states she is missing $5 and her house keys. Unable to locate them and not listed on belongings sheet.

## 2023-08-16 DIAGNOSIS — F259 Schizoaffective disorder, unspecified: Secondary | ICD-10-CM | POA: Diagnosis not present

## 2023-08-16 MED ORDER — FOLIC ACID 1 MG PO TABS
1.0000 mg | ORAL_TABLET | Freq: Every day | ORAL | Status: DC
  Administered 2023-08-16 – 2023-09-07 (×26): 1 mg via ORAL
  Filled 2023-08-16 (×23): qty 1

## 2023-08-16 MED ORDER — CALCITRIOL 0.25 MCG PO CAPS
0.5000 ug | ORAL_CAPSULE | Freq: Every day | ORAL | Status: DC
  Administered 2023-08-16 – 2023-09-07 (×26): 0.5 ug via ORAL
  Filled 2023-08-16 (×25): qty 2

## 2023-08-16 MED ORDER — METOLAZONE 2.5 MG PO TABS
2.5000 mg | ORAL_TABLET | ORAL | Status: DC
  Administered 2023-08-17 – 2023-09-07 (×12): 2.5 mg via ORAL
  Filled 2023-08-16 (×11): qty 1

## 2023-08-16 MED ORDER — CAPSAICIN 0.025 % EX CREA
TOPICAL_CREAM | Freq: Two times a day (BID) | CUTANEOUS | Status: DC | PRN
  Administered 2023-08-16 – 2023-09-06 (×6): 1 via TOPICAL
  Filled 2023-08-16 (×3): qty 1

## 2023-08-16 MED ORDER — EZETIMIBE 10 MG PO TABS
10.0000 mg | ORAL_TABLET | Freq: Every day | ORAL | Status: DC
  Administered 2023-08-16 – 2023-09-07 (×26): 10 mg via ORAL
  Filled 2023-08-16 (×24): qty 1

## 2023-08-16 MED ORDER — FERROUS SULFATE 325 (65 FE) MG PO TABS
325.0000 mg | ORAL_TABLET | Freq: Every day | ORAL | Status: DC
  Administered 2023-08-16 – 2023-09-07 (×26): 325 mg via ORAL
  Filled 2023-08-16 (×23): qty 1

## 2023-08-16 MED ORDER — MIRTAZAPINE 15 MG PO TABS
15.0000 mg | ORAL_TABLET | Freq: Every day | ORAL | Status: DC
  Administered 2023-08-16 – 2023-09-06 (×25): 15 mg via ORAL
  Filled 2023-08-16 (×23): qty 1

## 2023-08-16 NOTE — Progress Notes (Signed)
   08/16/23 0100  Psych Admission Type (Psych Patients Only)  Admission Status Voluntary  Psychosocial Assessment  Patient Complaints Depression  Eye Contact Brief  Facial Expression Animated  Affect Blunted  Speech Logical/coherent  Interaction Assertive;Needy  Motor Activity Unsteady  Appearance/Hygiene In scrubs  Behavior Characteristics Cooperative;Appropriate to situation;Anxious  Mood Sullen  Aggressive Behavior  Effect No apparent injury  Thought Process  Coherency Concrete thinking  Content WDL  Delusions None reported or observed  Perception WDL  Hallucination None reported or observed  Judgment Limited  Confusion Mild  Danger to Self  Current suicidal ideation? Denies  Danger to Others  Danger to Others None reported or observed

## 2023-08-16 NOTE — [facility] Suicide Risk Assessment (Signed)
 [facility] Admission Suicide Risk Assessment   Nursing information obtained from:  Patient Demographic factors:  Living alone Current Mental Status:  NA Loss Factors:  NA Historical Factors:  NA Risk Reduction Factors:  NA  Total Time spent with patient: 30 minutes Principal Problem: Schizoaffective disorder (HCC) Diagnosis:  Principal Problem:   Schizoaffective disorder (HCC)  Subjective Data: Tiffany [name] is a 63 y.o. female admitted: Presented to the EDfor 08/13/2023  7:48 PM for after presenting due to a fall later patient exhibit extreme paranoia. She carries the psychiatric diagnoses of PTSD, MDD with and without psychotic features, schizoaffective disorder, and history of polysubstance use and has a past medical history of hypertension, ESRD with HD, congestive heart failure, diabetes, COPD, , stroke.   Her current presentation of paranoia, delusional thoughts, auditory and visual hallucinations is most consistent with schizoaffective disorder. She meets criteria for inpatient psychiatric admission based on current symptom Patient is admitted to [facility] unit with Q15 min safety monitoring. Multidisciplinary team approach is offered. Medication management; group/milieu therapy is offered.   Continued Clinical Symptoms:  Alcohol Use Disorder Identification Test Final Score (AUDIT): 0 The Alcohol Use Disorders Identification Test, Guidelines for Use in Primary Care, Second Edition.  World Science writer [facility]). Score between 0-7:  no or low risk or alcohol related problems. Score between 8-15:  moderate risk of alcohol related problems. Score between 16-19:  high risk of alcohol related problems. Score 20 or above:  warrants further diagnostic evaluation for alcohol dependence and treatment.   CLINICAL FACTORS:   Schizophrenia:   Paranoid or undifferentiated type   Musculoskeletal: Strength & Muscle Tone: decreased Gait & Station: unsteady Patient leans: N/A  Psychiatric  Specialty Exam:  Presentation  General Appearance:  Appropriate for Environment; Casual  Eye Contact: Fair  Speech: Clear and Coherent  Speech Volume: Normal  Handedness: Right   Mood and Affect  Mood: Anxious; Irritable  Affect: Labile   Thought Process  Thought Processes: Disorganized  Descriptions of Associations:Intact  Orientation:Partial  Thought Content:Paranoid Ideation  History of Schizophrenia/Schizoaffective disorder:Yes  Duration of Psychotic Symptoms:Greater than six months  Hallucinations:Hallucinations: None  Ideas of Reference:Paranoia; Delusions  Suicidal Thoughts:Suicidal Thoughts: No  Homicidal Thoughts:Homicidal Thoughts: No   Sensorium  Memory: Immediate Fair; Recent Fair; Remote Poor  Judgment: Impaired  Insight: Shallow   Executive Functions  Concentration: Fair  Attention Span: Fair  Recall: [employer] of Knowledge: Fair  Language: Fair   Psychomotor Activity  Psychomotor Activity: Psychomotor Activity: Normal   Assets  Assets: Communication Skills; Desire for Improvement; Social Support   Sleep  Sleep: Sleep: Fair    Physical Exam: Physical Exam ROS Blood pressure (!) 175/88, pulse 74, temperature 97.9 F (36.6 C), resp. rate 16, SpO2 99%. There is no height or weight on file to calculate BMI.   COGNITIVE FEATURES THAT CONTRIBUTE TO RISK:  None    SUICIDE RISK:   Minimal: No identifiable suicidal ideation.  Patients presenting with no risk factors but with morbid ruminations; may be classified as minimal risk based on the severity of the depressive symptoms  PLAN OF CARE: Patient is admitted to [facility] psych unit with Q15 min safety monitoring. Multidisciplinary team approach is offered. Medication management; group/milieu therapy is offered.   I certify that inpatient services furnished can reasonably be expected to improve the patient's condition.   Allyn Foil, MD 08/16/2023,  1:55 PM

## 2023-08-16 NOTE — Group Note (Signed)
 LCSW Group Therapy Note  Group Date: 08/16/2023 Start Time: 1330 End Time: 1400   Type of Therapy and Topic:  Group Therapy - Healthy vs Unhealthy Coping Skills  Participation Level:  None   Description of Group The focus of this group was to determine what unhealthy coping techniques typically are used by group members and what healthy coping techniques would be helpful in coping with various problems. Patients were guided in becoming aware of the differences between healthy and unhealthy coping techniques. Patients were asked to identify 2-3 healthy coping skills they would like to learn to use more effectively.  Therapeutic Goals Patients learned that coping is what human beings do all day long to deal with various situations in their lives Patients defined and discussed healthy vs unhealthy coping techniques Patients identified their preferred coping techniques and identified whether these were healthy or unhealthy Patients determined 2-3 healthy coping skills they would like to become more familiar with and use more often. Patients provided support and ideas to each other   Summary of Patient Progress:  Pt was sleeping throughout group. [name] awoke pt at the beginning of group and pt went back to sleep   Therapeutic Modalities Cognitive Behavioral Therapy Motivational Interviewing  Tiffany [name], [place] 08/16/2023  2:37 PM

## 2023-08-16 NOTE — Group Note (Signed)
 Date:  08/16/2023 Time:  4:09 AM  Group Topic/Focus:  Wrap-Up Group:   The focus of this group is to help patients review their daily goal of treatment and discuss progress on daily workbooks.    Participation Level:  Active  Participation Quality:  Appropriate  Affect:  Appropriate  Cognitive:  Appropriate  Insight: Good  Engagement in Group:  Engaged  Modes of Intervention:  Discussion  Additional Comments:    Kristen DEL Rekha Hobbins 08/16/2023, 4:09 AM

## 2023-08-16 NOTE — H&P (Signed)
 Psychiatric Admission Assessment Adult  Patient Identification: Tiffany [name] MRN:  [id number] Date of Evaluation:  08/16/2023 Chief Complaint:  Schizoaffective disorder (HCC) [F25.9]   History of Present Illness: Tiffany [name] is a 63 y.o. female admitted: Presented to the [facility] 08/13/2023  7:48 PM for after presenting due to a fall later patient exhibit extreme paranoia. She carries the psychiatric diagnoses of PTSD, MDD with and without psychotic features, schizoaffective disorder, and history of polysubstance use and has a past medical history of hypertension, ESRD with HD, congestive heart failure, diabetes, COPD, , stroke. Her current presentation of paranoia, delusional thoughts, auditory and visual hallucinations is most consistent with schizoaffective disorder. She meets criteria for inpatient psychiatric admission based on current symptom Patient is admitted to [facility] unit with Q15 min safety monitoring. Multidisciplinary team approach is offered. Medication management; group/milieu therapy is offered.   Total Time spent with patient: 1 hour Sleep  Sleep:Sleep: Fair  Past Psychiatric History:  Psychiatric History:  Information collected from ***  Prev Dx/Sx: *** Current Psych Provider: *** Home Meds (current): *** Previous Med Trials: *** Therapy: ***  Prior Psych Hospitalization: ***  Prior Self Harm: *** Prior Violence: ***  Family Psych History: *** Family Hx suicide: ***  Social History:  Developmental Hx: *** Educational Hx: *** Occupational Hx: *** Legal Hx: *** Living Situation: *** Spiritual Hx: *** Access to weapons/lethal means: ***   Substance History Alcohol: ***  Type of alcohol *** Last Drink *** Number of drinks per day *** History of alcohol withdrawal seizures *** History of DT's *** Tobacco: *** Illicit drugs: *** Prescription drug abuse: *** Rehab hx: *** Is the patient at risk to self? {yes no:314532}  Has the patient been a risk  to self in the past 6 months? {yes no:314532}  Has the patient been a risk to self within the distant past? {yes no:314532}  Is the patient a risk to others? {yes no:314532}  Has the patient been a risk to others in the past 6 months? {yes no:314532}  Has the patient been a risk to others within the distant past? {yes no:314532}   [place] Scale:  Flowsheet Row Admission (Current) from 08/15/2023 in [facility] BEHAVIORAL [facility] ED from 08/13/2023 in [facility] Emergency Department at [facility] ED to Hosp-Admission (Discharged) from 07/27/2023 in [facility]M KIDNEY UNIT  C-SSRS RISK CATEGORY No Risk No Risk No Risk     Past Medical History:  Past Medical History:  Diagnosis Date   Abdominal pain    Acute encephalopathy 11/21/2017   Acute exacerbation of CHF (congestive heart failure) (HCC) 03/03/2022   Acute GI bleeding 03/29/2021   Acute metabolic encephalopathy 02/20/2020   Aggressive behavior    Agitation 11/22/2017   Anoxic brain injury (HCC) 09/08/2016   C. Arrest due to respiratory failure and COPD exacerbation   Anxiety    Arthritis    all over (04/10/2016)   Asthma 10/18/2010   Binge eating disorder    Blood loss anemia 04/24/2011   CAP (community acquired pneumonia) 06/22/2015   Cardiac arrest (HCC) 09/08/2016   PEA   Carotid artery stenosis    1-39% bilateral by dopplers 11/2016   Chronic diastolic (congestive) heart failure (HCC)    Chronic pain syndrome 06/18/2012   Chronic post-traumatic stress disorder (PTSD) 05/27/2018   Chronic respiratory failure with hypoxia and hypercapnia (HCC) 06/22/2015   TRILOGY Vent >AVAPA-ES., Vt target 200-400, Max P 30 , PS max 20 , PS min  6-10 , E Max 6, E Min 4, Rate Auto AVAPS Rate 2 (titrate for pt comfort) , bleed O2 at 5l/m continuous flow .    Closed displaced fracture of fifth metacarpal bone 03/21/2018   Cocaine use disorder, severe, in sustained remission (HCC) 12/17/2015   Complication of  anesthesia    decreased bp, decreased heart rate   COPD (chronic obstructive pulmonary disease) (HCC) 07/08/2014   Delusional disorder, persecutory type (HCC) 06/26/2021   Depression    Diabetic neuropathy (HCC) 04/24/2011   Difficulty with speech 01/24/2018   Disorder of nervous system    Drug abuse (HCC) 11/21/2017   Dyslipidemia 04/24/2011   E. coli UTI 02/20/2020   Elevated troponin 04/28/2012   Emphysema    Encephalopathy 11/21/2017   Essential hypertension 03/22/2016   Facet arthropathy, lumbar 06/12/2022   Fibula fracture 07/10/2016   Frequent falls 10/11/2017   GERD (gastroesophageal reflux disease)    GI bleed 03/30/2021   Gout 04/11/2017   Heart attack (HCC) 1980s   Hematochezia    History of blood transfusion 1994   couldn't stop bleeding from my period   History of drug abuse in remission (HCC) 11/28/2015   Quit in 2017   Hyperlipidemia 03/31/2021   Hyperlipidemia LDL goal <70    Hypocalcemia    Hypokalemia    Hypomagnesemia    Incontinence    Manic depression (HCC)    Morbid obesity (HCC) 10/18/2010   Obstructive sleep apnea 10/18/2010   On home oxygen  therapy    6L; 24/7 (04/10/2016)   OSA on CPAP    wear mask sometimes (04/10/2016)   Painless rectal bleeding 12/31/2015   Paranoid (HCC)    sometimes; I'm on RX for it (04/10/2016)   Periumbilical abdominal pain    Prolonged Q-T interval on ECG    Rectal bleeding 12/31/2015   Recurrent syncope 07/09/2021   Rhabdomyolysis 06/16/2021   Schizoaffective disorder, bipolar type (HCC) 04/05/2018   Seasonal allergies    Seborrheic keratoses 12/31/2013   Seizures (HCC)    don't know what kind; last one was ~ 1 yr ago (04/10/2016)   Sinus bradycardia    Skin ulcer of sacrum, limited to breakdown of skin (HCC) 03/08/2022   Spondylolisthesis at L3-L4 level 06/12/2022   Stroke (HCC) 1980s   denies residual on 04/10/2016   Thrush 09/19/2013   Type 2 diabetes mellitus (HCC) 10/18/2010    Past Surgical  History:  Procedure Laterality Date   A/V FISTULAGRAM N/A 02/01/2023   Procedure: A/V Fistulagram;  Surgeon: Norine Manuelita LABOR, MD;  Location: [facility] INVASIVE CV LAB;  Service: Cardiovascular;  Laterality: N/A;   AV FISTULA PLACEMENT Left 12/21/2022   Procedure: INSERTION OF LEFT ARM ARTERIOVENOUS (AV) GORE-TEX GRAFT;  Surgeon: Magda Debby SAILOR, MD;  Location: [facility] OR;  Service: Vascular;  Laterality: Left;   CESAREAN SECTION  1997   COLONOSCOPY WITH PROPOFOL  N/A 04/01/2021   Procedure: COLONOSCOPY WITH PROPOFOL ;  Surgeon: Rollin Dover, MD;  Location: [facility] ENDOSCOPY;  Service: Gastroenterology;  Laterality: N/A;  Rectal bleeding with drop in hemoglobin to 7.2 g/dL   HERNIA REPAIR     IR CHOLANGIOGRAM EXISTING TUBE  07/20/2016   IR PERC CHOLECYSTOSTOMY  05/10/2016   IR RADIOLOGIST EVAL & MGMT  06/08/2016   IR RADIOLOGIST EVAL & MGMT  06/29/2016   IR SINUS/FIST TUBE CHK-NON GI  07/12/2016   RIGHT/LEFT HEART CATH AND CORONARY ANGIOGRAPHY N/A 06/19/2017   Procedure: RIGHT/LEFT HEART CATH AND CORONARY ANGIOGRAPHY;  Surgeon: Cherrie Toribio SAUNDERS, MD;  Location: [facility] INVASIVE CV LAB;  Service: Cardiovascular;  Laterality: N/A;   TIBIA IM NAIL INSERTION Right 07/12/2016   Procedure: INTRAMEDULLARY (IM) NAIL RIGHT TIBIA;  Surgeon: Jerri Kay HERO, MD;  Location: [facility] OR;  Service: Orthopedics;  Laterality: Right;   UMBILICAL HERNIA REPAIR  ~ 1963   that's why I don't have a belly button   VAGINAL HYSTERECTOMY     Family History:  Family History  Problem Relation Age of Onset   Cancer Mother        lung   Depression Mother    Cancer Father        prostate   Depression Sister    Anxiety disorder Sister    Schizophrenia Sister    Bipolar disorder Sister    Depression Sister    Depression Brother    Heart failure Other        cousin    Social History:  Social History   Substance and Sexual Activity  Alcohol Use No   Alcohol/week: 0.0 standard drinks of alcohol     Social History   Substance  and Sexual Activity  Drug Use Not Currently   Types: Cocaine   Comment: 04/10/2016 last used cocaine back in November 2017      Allergies:   Allergies  Allergen Reactions   Dorethia Hue ] Other (See Comments)    GI bleed   Deltasone  [Prednisone ] Anaphylaxis and Swelling   Latuda [Lurasidone Hcl] Anaphylaxis   Lortab [Hydrocodone -Acetaminophen ] Hives and Shortness Of Breath   Magnesium -Containing Compounds Anaphylaxis and Other (See Comments)    Tolerated Ensure   Ultram  [Tramadol ] Anaphylaxis, Swelling and Rash   Codeine Nausea And Vomiting and Rash   Desyrel  [Trazodone ] Other (See Comments)    Paranoia    Peanut (Diagnostic) Hives and Other (See Comments)    Raw peanuts only  OK to eat peanut butter.   Apresoline  [Hydralazine ] Other (See Comments)    Muscle spasms   Sulfa Antibiotics Itching   Tape Rash   Topamax  [Topiramate ] Other (See Comments)    Paranoia   Lab Results:  Results for orders placed or performed during the hospital encounter of 08/13/23 (from the past 48 hours)  Basic metabolic panel     Status: Abnormal   Collection Time: 08/15/23 11:01 AM  Result Value Ref Range   Sodium 131 (L) 135 - 145 mmol/L   Potassium 2.7 (LL) 3.5 - 5.1 mmol/L    Comment: CRITICAL RESULT CALLED TO, READ BACK BY AND VERIFIED WITH TOPHER APPLEGATE, RN @ 1253 08/15/23 BY SEKDAHL   Chloride 90 (L) 98 - 111 mmol/L   CO2 26 22 - 32 mmol/L   Glucose, Bld 89 70 - 99 mg/dL    Comment: Glucose reference range applies only to samples taken after fasting for at least 8 hours.   BUN 77 (H) 8 - 23 mg/dL   Creatinine, Ser 2.24 (H) 0.44 - 1.00 mg/dL   Calcium  8.6 (L) 8.9 - 10.3 mg/dL   GFR, Estimated 5 (L) >60 mL/min    Comment: (NOTE) Calculated using the CKD-EPI Creatinine Equation (2021)    Anion gap 15 5 - 15    Comment: Performed at [facility], [address], [place], [place] [address]  SARS Coronavirus 2 by RT PCR (hospital order, performed in [facility] hospital lab)  *cepheid single result test* Anterior Nasal Swab     Status: None   Collection Time: 08/15/23 11:07 AM   Specimen: Anterior Nasal Swab  Result Value Ref Range   SARS Coronavirus 2 by RT PCR NEGATIVE NEGATIVE    Comment: Performed at [facility], [address], [place], [place] [address]   *Note: Due to a large number of results and/or encounters for the requested time period, some results have not been displayed. A complete set of results can be found in Results Review.    Blood Alcohol level:  Lab Results  Component Value Date   [facility] <15 05/31/2023   ETH <10 03/10/2023    Metabolic Disorder Labs:  Lab Results  Component Value Date   HGBA1C 5.0 01/05/2022   MPG 99.67 06/17/2021   MPG 105.41 07/23/2020   No results found for: PROLACTIN Lab Results  Component Value Date   CHOL 151 11/14/2022   TRIG 100 11/14/2022   HDL 65 11/14/2022   CHOLHDL 2.3 11/14/2022   VLDL 20 11/14/2022   LDLCALC 66 11/14/2022   LDLCALC 54 08/05/2018    Current Medications: Current Facility-Administered Medications  Medication Dose Route Frequency Provider Last Rate Last Admin   albuterol  (VENTOLIN  HFA) 108 (90 Base) MCG/ACT inhaler 2 puff  2 puff Inhalation Q6H PRN Mannie Jerel PARAS, NP       amLODipine  (NORVASC ) tablet 5 mg  5 mg Oral BID Mannie Jerel PARAS, NP   5 mg at 08/16/23 1019   atorvastatin  (LIPITOR ) tablet 20 mg  20 mg Oral Daily Mannie Jerel PARAS, NP   20 mg at 08/16/23 1020   calcitRIOL  (ROCALTROL ) capsule 0.5 mcg  0.5 mcg Oral Daily Isak Sotomayor, MD       capsaicin  (ZOSTRIX) 0.025 % cream   Topical BID PRN Onuoha, Chinwendu V, NP   1 Application at 08/16/23 9671   carvedilol  (COREG ) tablet 12.5 mg  12.5 mg Oral BID Mannie Jerel PARAS, NP   12.5 mg at 08/16/23 1020   dicyclomine  (BENTYL ) capsule 10 mg  10 mg Oral TID Mannie Jerel PARAS, NP   10 mg at 08/16/23 1022   escitalopram  (LEXAPRO ) tablet 5 mg  5 mg Oral Daily Mannie Jerel PARAS, NP   5 mg at 08/16/23 1020   ezetimibe   (ZETIA ) tablet 10 mg  10 mg Oral Daily Axil Copeman, MD       ferrous sulfate  tablet 325 mg  325 mg Oral Daily Eleazar Kimmey, MD       folic acid  (FOLVITE ) tablet 1 mg  1 mg Oral Daily Landen Knoedler, MD       gabapentin  (NEURONTIN ) capsule 100 mg  100 mg Oral QHS Mannie Jerel PARAS, NP   100 mg at 08/15/23 2125   hydrOXYzine  (ATARAX ) tablet 10 mg  10 mg Oral TID PRN Mannie Jerel PARAS, NP   10 mg at 08/15/23 2221   melatonin tablet 5 mg  5 mg Oral QHS Onuoha, Chinwendu V, NP   5 mg at 08/15/23 2248   [START ON 08/17/2023] metolazone  (ZAROXOLYN ) tablet 2.5 mg  2.5 mg Oral Q M,W,F Susan Arana, MD       mirtazapine  (REMERON ) tablet 15 mg  15 mg Oral QHS Janee Ureste, MD       OLANZapine  (ZYPREXA ) injection 5 mg  5 mg Intramuscular TID PRN Mannie Jerel PARAS, NP       OLANZapine  (ZYPREXA ) tablet 2.5 mg  2.5 mg Oral Daily Mannie Jerel PARAS, NP   2.5 mg at 08/16/23 1019   OLANZapine  (ZYPREXA ) tablet 5 mg  5 mg Oral QHS Mannie Jerel PARAS, NP   5 mg at  08/15/23 2125   OLANZapine  zydis (ZYPREXA ) disintegrating tablet 5 mg  5 mg Oral TID PRN Mannie Jerel PARAS, NP       torsemide  (DEMADEX ) tablet 40 mg  40 mg Oral BID Mannie Jerel PARAS, NP   40 mg at 08/16/23 1023   PTA Medications: Medications Prior to Admission  Medication Sig Dispense Refill Last Dose/Taking   albuterol  (VENTOLIN  HFA) 108 (90 Base) MCG/ACT inhaler Inhale 2 puffs into the lungs every 6 (six) hours as needed for wheezing or shortness of breath. 8.5 g 2    [Paused] amLODipine  (NORVASC ) 10 MG tablet Take 0.5 tablets (5 mg total) by mouth 2 (two) times daily. (Patient not taking: Reported on 08/14/2023) 180 tablet 0    Asenapine  Maleate (SAPHRIS ) 10 MG SUBL Place 10 mg under the tongue at bedtime. Only namebrand      atorvastatin  (LIPITOR ) 20 MG tablet Take 1 tablet (20 mg total) by mouth daily. 90 tablet 0    calcitRIOL  (ROCALTROL ) 0.5 MCG capsule Take 1 capsule (0.5 mcg total) by mouth daily. 90 capsule 0    carvedilol  (COREG ) 12.5 MG  tablet Take 1 tablet (12.5 mg total) by mouth 2 (two) times daily. 180 tablet 0    cyclobenzaprine  (FLEXERIL ) 10 MG tablet Take 1 tablet (10 mg total) by mouth 3 (three) times daily. 90 tablet 5    dicyclomine  (BENTYL ) 10 MG capsule Take 1 capsule (10 mg total) by mouth 3 (three) times daily. 270 capsule 0    DULoxetine  (CYMBALTA ) 30 MG capsule Take 1 capsule (30 mg total) by mouth daily. 90 capsule 0    epoetin  alfa (EPOGEN ) 10000 UNIT/ML injection Inject 1 mL (10,000 Units total) into the vein every Monday, Wednesday, and Friday with hemodialysis. 1 mL 3    escitalopram  (LEXAPRO ) 10 MG tablet Take 1 tablet (10 mg total) by mouth daily. 90 tablet 0    ezetimibe  (ZETIA ) 10 MG tablet Take 1 tablet (10 mg total) by mouth daily. Please call office to schedule an appt for further refills. Thank you 90 tablet 0    ferrous sulfate  325 (65 FE) MG tablet Take 1 tablet (325 mg total) by mouth daily. 90 tablet 3    Fluticasone -Umeclidin-Vilant (TRELEGY ELLIPTA ) 100-62.5-25 MCG/ACT AEPB Inhale 1 puff into the lungs daily. 60 each 3    folic acid  (FOLVITE ) 1 MG tablet Take 1 tablet (1 mg total) by mouth daily. 90 tablet 0    gabapentin  (NEURONTIN ) 100 MG capsule Take 1 capsule (100 mg total) by mouth at bedtime. 90 capsule 0    hydrOXYzine  (ATARAX ) 10 MG tablet Take 1 tablet (10 mg total) by mouth 3 (three) times daily as needed for anxiety. (Patient taking differently: Take 10 mg by mouth 3 (three) times daily.) 90 tablet 0    lamoTRIgine  (LAMICTAL ) 100 MG tablet Take 1 tablet (100 mg total) by mouth at bedtime. 90 tablet 0    lidocaine  (LIDODERM ) 5 % Place 1 patch onto the skin daily. Remove & Discard patch within 12 hours or as directed by MD (Patient taking differently: Place 1 patch onto the skin daily as needed (pain).) 3 patch 0    loperamide  (IMODIUM ) 2 MG capsule Take 1 capsule (2 mg total) by mouth as needed for diarrhea or loose stools. 30 capsule 1    MELATONIN PO Take 1 tablet by mouth at bedtime.       metolazone  (ZAROXOLYN ) 2.5 MG tablet Take 1 tablet (2.5 mg total) by mouth every Monday, Wednesday,  and Friday. 90 tablet 0    mirtazapine  (REMERON ) 7.5 MG tablet Take 2 tablets (15 mg total) by mouth at bedtime. 180 tablet 0    Multiple Vitamins-Minerals (CENTRUM SILVER  50+WOMEN) TABS Take 1 tablet by mouth at bedtime.      omeprazole  (PRILOSEC) 40 MG capsule Take 1 capsule (40 mg total) by mouth every morning. 90 capsule 0    prazosin  (MINIPRESS ) 2 MG capsule Take 1 capsule (2 mg total) by mouth at bedtime. (Patient not taking: Reported on 08/14/2023) 30 capsule 5    QUEtiapine  (SEROQUEL ) 100 MG tablet Take 100-200 mg by mouth 2 (two) times daily. Take one tablet by mouth in morning. Take two tablets by mouth in the afternoon.      torsemide  40 MG TABS Take 40 mg by mouth 2 (two) times daily. 60 tablet 3     Psychiatric Specialty Exam:  Presentation  General Appearance:  Appropriate for Environment; Casual  Eye Contact: Fair  Speech: Clear and Coherent  Speech Volume: Normal    Mood and Affect  Mood: Anxious; Irritable  Affect: Labile   Thought Process  Thought Processes: Disorganized  Descriptions of Associations:Intact  Orientation:Partial  Thought Content:Paranoid Ideation  Hallucinations:Hallucinations: None  Ideas of Reference:Paranoia; Delusions  Suicidal Thoughts:Suicidal Thoughts: No  Homicidal Thoughts:Homicidal Thoughts: No   Sensorium  Memory: Immediate Fair; Recent Fair; Remote Poor  Judgment: Impaired  Insight: Shallow   Executive Functions  Concentration: Fair  Attention Span: Fair  Recall: [employer] of Knowledge: Fair  Language: Fair   Psychomotor Activity  Psychomotor Activity: Psychomotor Activity: Normal   Assets  Assets: Communication Skills; Desire for Improvement; Social Support    Musculoskeletal: Strength & Muscle Tone: {desc; muscle tone:32375} Gait & Station: {PE GAIT ED WJUO:77474}  Physical  Exam: Physical Exam ROS Blood pressure (!) 175/88, pulse 74, temperature 97.9 F (36.6 C), resp. rate 16, SpO2 99%. There is no height or weight on file to calculate BMI.  Principal Diagnosis: Schizoaffective disorder (HCC) Diagnosis:  Principal Problem:   Schizoaffective disorder (HCC)   Clinical Decision Making:  Treatment Plan Summary:  Safety and Monitoring:             -- Voluntary admission to inpatient psychiatric unit for safety, stabilization and treatment             -- Daily contact with patient to assess and evaluate symptoms and progress in treatment             -- Patient's case to be discussed in multi-disciplinary team meeting             -- Observation Level: q15 minute checks             -- Vital signs:  q12 hours             -- Precautions: suicide, elopement, and assault   2. Psychiatric Diagnoses and Treatment:                   -- The risks/benefits/side-effects/alternatives to this medication were discussed in detail with the patient and time was given for questions. The patient consents to medication trial.                -- Metabolic profile and EKG monitoring obtained while on an atypical antipsychotic (BMI: Lipid Panel: HbgA1c: QTc:)              -- Encouraged patient to participate in unit milieu and in scheduled group therapies  3. Medical Issues Being Addressed:      4. Discharge Planning:              -- Social work and case management to assist with discharge planning and identification of hospital follow-up needs prior to discharge             -- Estimated LOS: 5-7 days             -- Discharge Concerns: Need to establish a safety plan; Medication compliance and effectiveness             -- Discharge Goals: Return home with outpatient referrals follow ups  Physician Treatment Plan for Primary Diagnosis: Schizoaffective disorder (HCC) Long Term Goal(s): Improvement in symptoms so as ready for discharge  Short Term  Goals: Ability to identify changes in lifestyle to reduce recurrence of condition will improve, Ability to verbalize feelings will improve, Ability to disclose and discuss suicidal ideas, Ability to demonstrate self-control will improve, and Ability to identify and develop effective coping behaviors will improve  Physician Treatment Plan for Secondary Diagnosis: Principal Problem:   Schizoaffective disorder (HCC)  Long Term Goal(s): Improvement in symptoms so as ready for discharge  Short Term Goals: Ability to identify changes in lifestyle to reduce recurrence of condition will improve, Ability to verbalize feelings will improve, Ability to disclose and discuss suicidal ideas, Ability to demonstrate self-control will improve, and Ability to identify and develop effective coping behaviors will improve  I certify that inpatient services furnished can reasonably be expected to improve the patient's condition.    Elin Fenley, MD 7/31/20251:58 PM

## 2023-08-16 NOTE — Progress Notes (Signed)
   08/16/23 1200  Psych Admission Type (Psych Patients Only)  Admission Status Voluntary  Psychosocial Assessment  Patient Complaints Depression  Eye Contact Brief  Facial Expression Animated  Affect Blunted  Speech Logical/coherent  Interaction Needy;Assertive  Motor Activity Unsteady  Appearance/Hygiene In scrubs;Body odor  Behavior Characteristics Cooperative  Mood Sullen;Irritable  Thought Process  Coherency Concrete thinking  Content Paranoia  Delusions None reported or observed  Perception WDL  Hallucination None reported or observed  Judgment Limited  Confusion Mild  Danger to Self  Current suicidal ideation? Denies  Danger to Others  Danger to Others None reported or observed

## 2023-08-16 NOTE — Group Note (Signed)
 Recreation Therapy Group Note   Group Topic:Coping Skills  Group Date: 08/16/2023 Start Time: 1415 End Time: 1505 Facilitators: Celestia Jeoffrey BRAVO, LRT, CTRS Location: Dayroom  Group Description: Mind Map.  Patient was provided a blank template of a diagram with 32 blank boxes in a tiered system, branching from the center (similar to a bubble chart). LRT directed patients to label the middle of the diagram Coping Skills. LRT and patients then came up with 8 different coping skills as examples. Pt were directed to record their coping skills in the 2nd tier boxes closest to the center.  Patients would then share their coping skills with the group as LRT wrote them out. LRT gave a handout of 99 different coping skills at the end of group.   Goal Area(s) Addressed: Patients will be able to define "coping skills". Patient will identify new coping skills.  Patient will increase communication.  Affect/Mood: Appropriate   Participation Level: Moderate   Participation Quality: Independent   Behavior: Calm and Cooperative   Speech/Thought Process: Coherent   Insight: Fair   Judgement: Fair    Modes of Intervention: Education, Exploration, Worksheet, and Writing   Patient Response to Interventions:  Receptive   Education Outcome:  In group clarification offered    Clinical Observations/Individualized Feedback: Shreena was active in their participation of session activities and group discussion. Pt identified talk to a therapist, practice mindfulness, and get out of the house as coping skills.    Plan: Continue to engage patient in RT group sessions 2-3x/week.   [address] LRT, CTRS 08/16/2023 5:02 PM

## 2023-08-16 NOTE — Group Note (Signed)
 Date:  08/16/2023 Time:  10:43 AM  Group Topic/Focus:  Making Healthy Choices:   The focus of this group is to help patients identify negative/unhealthy choices they were using prior to admission and identify positive/healthier coping strategies to replace them upon discharge.    Participation Level:  Did Not Attend    Tiffany [name] 08/16/2023, 10:43 AM

## 2023-08-16 NOTE — Group Note (Signed)
 Date:  08/16/2023 Time:  8:21 PM  Group Topic/Focus:  Healthy Communication:   The focus of this group is to discuss communication, barriers to communication, as well as healthy ways to communicate with others.    Participation Level:  Active  Participation Quality:  Appropriate  Affect:  Appropriate  Cognitive:  Appropriate  Insight: Good  Engagement in Group:  Engaged  Modes of Intervention:  Discussion  Additional Comments:    Romero Earnie Hope 08/16/2023, 8:21 PM

## 2023-08-16 NOTE — Group Note (Unsigned)
 Date:  08/16/2023 Time:  10:46 AM  Group Topic/Focus:  Making Healthy Choices:   The focus of this group is to help patients identify negative/unhealthy choices they were using prior to admission and identify positive/healthier coping strategies to replace them upon discharge.     Participation Level:  {BHH PARTICIPATION OZCZO:77735}  Participation Quality:  {BHH PARTICIPATION QUALITY:22265}  Affect:  {BHH AFFECT:22266}  Cognitive:  {BHH COGNITIVE:22267}  Insight: {BHH Insight2:20797}  Engagement in Group:  {BHH ENGAGEMENT IN HMNLE:77731}  Modes of Intervention:  {BHH MODES OF INTERVENTION:22269}  Additional Comments:  ***  Norleen SHAUNNA Bias 08/16/2023, 10:46 AM

## 2023-08-17 DIAGNOSIS — F259 Schizoaffective disorder, unspecified: Secondary | ICD-10-CM | POA: Diagnosis not present

## 2023-08-17 LAB — CBC
HCT: 25.6 % — ABNORMAL LOW (ref 36.0–46.0)
Hemoglobin: 8 g/dL — ABNORMAL LOW (ref 12.0–15.0)
MCH: 28.6 pg (ref 26.0–34.0)
MCHC: 31.3 g/dL (ref 30.0–36.0)
MCV: 91.4 fL (ref 80.0–100.0)
Platelets: 355 K/uL (ref 150–400)
RBC: 2.8 MIL/uL — ABNORMAL LOW (ref 3.87–5.11)
RDW: 16.2 % — ABNORMAL HIGH (ref 11.5–15.5)
WBC: 6.8 K/uL (ref 4.0–10.5)
nRBC: 0 % (ref 0.0–0.2)

## 2023-08-17 LAB — RENAL FUNCTION PANEL
Albumin: 3 g/dL — ABNORMAL LOW (ref 3.5–5.0)
Anion gap: 11 (ref 5–15)
BUN: 57 mg/dL — ABNORMAL HIGH (ref 8–23)
CO2: 29 mmol/L (ref 22–32)
Calcium: 8.7 mg/dL — ABNORMAL LOW (ref 8.9–10.3)
Chloride: 95 mmol/L — ABNORMAL LOW (ref 98–111)
Creatinine, Ser: 4.26 mg/dL — ABNORMAL HIGH (ref 0.44–1.00)
GFR, Estimated: 11 mL/min — ABNORMAL LOW (ref >60)
Glucose, Bld: 118 mg/dL — ABNORMAL HIGH (ref 70–99)
Phosphorus: 2.3 mg/dL — ABNORMAL LOW (ref 2.5–4.6)
Potassium: 3.2 mmol/L — ABNORMAL LOW (ref 3.5–5.1)
Sodium: 135 mmol/L (ref 135–145)

## 2023-08-17 MED ORDER — PALIPERIDONE ER 3 MG PO TB24: 3.0000 mg | ORAL_TABLET | Freq: Every day | ORAL | Status: DC

## 2023-08-17 MED ORDER — CHLORHEXIDINE GLUCONATE CLOTH 2 % EX PADS
6.0000 | MEDICATED_PAD | Freq: Every day | CUTANEOUS | Status: DC
  Administered 2023-08-25 – 2023-09-01 (×2): 6 via TOPICAL
  Filled 2023-08-17: qty 6

## 2023-08-17 MED ORDER — PALIPERIDONE ER 3 MG PO TB24
3.0000 mg | ORAL_TABLET | Freq: Every day | ORAL | Status: DC
  Administered 2023-08-17 – 2023-08-18 (×2): 3 mg via ORAL
  Filled 2023-08-17 (×3): qty 1

## 2023-08-17 MED ORDER — PALIPERIDONE ER 3 MG PO TB24: 6.0000 mg | ORAL_TABLET | Freq: Every day | ORAL | Status: DC

## 2023-08-17 NOTE — Progress Notes (Signed)
   08/17/23 1400  Psych Admission Type (Psych Patients Only)  Admission Status Voluntary  Psychosocial Assessment  Patient Complaints Depression  Eye Contact Brief  Facial Expression Animated  Affect Appropriate to circumstance  Speech Logical/coherent  Interaction Assertive  Motor Activity Unsteady  Appearance/Hygiene In scrubs  Behavior Characteristics Cooperative  Mood Depressed  Thought Process  Coherency Concrete thinking  Content Paranoia  Delusions None reported or observed  Perception WDL  Hallucination None reported or observed  Judgment Limited  Confusion Mild  Danger to Self  Current suicidal ideation? Denies  Danger to Others  Danger to Others None reported or observed

## 2023-08-17 NOTE — Plan of Care (Signed)
  Problem: Activity: Goal: Will verbalize the importance of balancing activity with adequate rest periods Outcome: Progressing   Problem: Education: Goal: Knowledge of the prescribed therapeutic regimen will improve Outcome: Progressing   Problem: Coping: Goal: Coping ability will improve Outcome: Progressing   Problem: Role Relationship: Goal: Ability to communicate needs accurately will improve Outcome: Progressing Goal: Ability to interact with others will improve Outcome: Progressing   Problem: Safety: Goal: Ability to redirect hostility and anger into socially appropriate behaviors will improve Outcome: Progressing Goal: Ability to remain free from injury will improve Outcome: Progressing

## 2023-08-17 NOTE — BH IP Treatment Plan (Signed)
 Interdisciplinary Treatment and Diagnostic Plan Update  08/17/2023 Time of Session: 10:15 AM  Tiffany [name] MRN: [id number]  Principal Diagnosis: Schizoaffective disorder [facility])  Secondary Diagnoses: Principal Problem:   Schizoaffective disorder (HCC)   Current Medications:  Current Facility-Administered Medications  Medication Dose Route Frequency Provider Last Rate Last Admin   albuterol  (VENTOLIN  HFA) 108 (90 Base) MCG/ACT inhaler 2 puff  2 puff Inhalation Q6H PRN Mannie Jerel PARAS, NP   2 puff at 08/17/23 0949   amLODipine  (NORVASC ) tablet 5 mg  5 mg Oral BID Mannie Jerel PARAS, NP   5 mg at 08/17/23 9076   atorvastatin  (LIPITOR ) tablet 20 mg  20 mg Oral Daily Mannie Jerel PARAS, NP   20 mg at 08/17/23 9075   calcitRIOL  (ROCALTROL ) capsule 0.5 mcg  0.5 mcg Oral Daily Jadapalle, Sree, MD   0.5 mcg at 08/17/23 0923   capsaicin  (ZOSTRIX) 0.025 % cream   Topical BID PRN Onuoha, Chinwendu V, NP   Given at 08/17/23 0151   carvedilol  (COREG ) tablet 12.5 mg  12.5 mg Oral BID Mannie Jerel PARAS, NP   12.5 mg at 08/17/23 9076   Chlorhexidine  Gluconate Cloth 2 % PADS 6 each  6 each Topical Q0600 Druscilla Bald, NP       dicyclomine  (BENTYL ) capsule 10 mg  10 mg Oral TID Mannie Jerel PARAS, NP   10 mg at 08/17/23 9075   escitalopram  (LEXAPRO ) tablet 5 mg  5 mg Oral Daily Mannie Jerel PARAS, NP   5 mg at 08/17/23 9076   ezetimibe  (ZETIA ) tablet 10 mg  10 mg Oral Daily Jadapalle, Sree, MD   10 mg at 08/17/23 9075   ferrous sulfate  tablet 325 mg  325 mg Oral Daily Jadapalle, Sree, MD   325 mg at 08/17/23 9076   folic acid  (FOLVITE ) tablet 1 mg  1 mg Oral Daily Jadapalle, Sree, MD   1 mg at 08/17/23 9076   gabapentin  (NEURONTIN ) capsule 100 mg  100 mg Oral QHS Mannie Jerel PARAS, NP   100 mg at 08/16/23 2152   hydrOXYzine  (ATARAX ) tablet 10 mg  10 mg Oral TID PRN Mannie Jerel PARAS, NP   10 mg at 08/16/23 2153   melatonin tablet 5 mg  5 mg Oral QHS Onuoha, Chinwendu V, NP   5 mg at 08/16/23 2153   metolazone   (ZAROXOLYN ) tablet 2.5 mg  2.5 mg Oral Q M,W,F Jadapalle, Sree, MD   2.5 mg at 08/17/23 9075   mirtazapine  (REMERON ) tablet 15 mg  15 mg Oral QHS Jadapalle, Sree, MD   15 mg at 08/16/23 2153   OLANZapine  (ZYPREXA ) injection 5 mg  5 mg Intramuscular TID PRN Mannie Jerel PARAS, NP       OLANZapine  (ZYPREXA ) tablet 2.5 mg  2.5 mg Oral Daily Mannie Jerel PARAS, NP   2.5 mg at 08/17/23 9075   OLANZapine  (ZYPREXA ) tablet 5 mg  5 mg Oral QHS Mannie Jerel PARAS, NP   5 mg at 08/16/23 2153   OLANZapine  zydis (ZYPREXA ) disintegrating tablet 5 mg  5 mg Oral TID PRN Mannie Jerel PARAS, NP       torsemide  (DEMADEX ) tablet 40 mg  40 mg Oral BID Mannie Jerel PARAS, NP   40 mg at 08/17/23 9076   PTA Medications: Medications Prior to Admission  Medication Sig Dispense Refill Last Dose/Taking   albuterol  (VENTOLIN  HFA) 108 (90 Base) MCG/ACT inhaler Inhale 2 puffs into the lungs every 6 (six) hours as needed for wheezing or shortness of  breath. 8.5 g 2    [Paused] amLODipine  (NORVASC ) 10 MG tablet Take 0.5 tablets (5 mg total) by mouth 2 (two) times daily. (Patient not taking: Reported on 08/14/2023) 180 tablet 0    Asenapine  Maleate (SAPHRIS ) 10 MG SUBL Place 10 mg under the tongue at bedtime. Only namebrand      atorvastatin  (LIPITOR ) 20 MG tablet Take 1 tablet (20 mg total) by mouth daily. 90 tablet 0    calcitRIOL  (ROCALTROL ) 0.5 MCG capsule Take 1 capsule (0.5 mcg total) by mouth daily. 90 capsule 0    carvedilol  (COREG ) 12.5 MG tablet Take 1 tablet (12.5 mg total) by mouth 2 (two) times daily. 180 tablet 0    cyclobenzaprine  (FLEXERIL ) 10 MG tablet Take 1 tablet (10 mg total) by mouth 3 (three) times daily. 90 tablet 5    dicyclomine  (BENTYL ) 10 MG capsule Take 1 capsule (10 mg total) by mouth 3 (three) times daily. 270 capsule 0    DULoxetine  (CYMBALTA ) 30 MG capsule Take 1 capsule (30 mg total) by mouth daily. 90 capsule 0    epoetin  alfa (EPOGEN ) 10000 UNIT/ML injection Inject 1 mL (10,000 Units total) into the vein  every Monday, Wednesday, and Friday with hemodialysis. 1 mL 3    escitalopram  (LEXAPRO ) 10 MG tablet Take 1 tablet (10 mg total) by mouth daily. 90 tablet 0    ezetimibe  (ZETIA ) 10 MG tablet Take 1 tablet (10 mg total) by mouth daily. Please call office to schedule an appt for further refills. Thank you 90 tablet 0    ferrous sulfate  325 (65 FE) MG tablet Take 1 tablet (325 mg total) by mouth daily. 90 tablet 3    Fluticasone -Umeclidin-Vilant (TRELEGY ELLIPTA ) 100-62.5-25 MCG/ACT AEPB Inhale 1 puff into the lungs daily. 60 each 3    folic acid  (FOLVITE ) 1 MG tablet Take 1 tablet (1 mg total) by mouth daily. 90 tablet 0    gabapentin  (NEURONTIN ) 100 MG capsule Take 1 capsule (100 mg total) by mouth at bedtime. 90 capsule 0    hydrOXYzine  (ATARAX ) 10 MG tablet Take 1 tablet (10 mg total) by mouth 3 (three) times daily as needed for anxiety. (Patient taking differently: Take 10 mg by mouth 3 (three) times daily.) 90 tablet 0    lamoTRIgine  (LAMICTAL ) 100 MG tablet Take 1 tablet (100 mg total) by mouth at bedtime. 90 tablet 0    lidocaine  (LIDODERM ) 5 % Place 1 patch onto the skin daily. Remove & Discard patch within 12 hours or as directed by MD (Patient taking differently: Place 1 patch onto the skin daily as needed (pain).) 3 patch 0    loperamide  (IMODIUM ) 2 MG capsule Take 1 capsule (2 mg total) by mouth as needed for diarrhea or loose stools. 30 capsule 1    MELATONIN PO Take 1 tablet by mouth at bedtime.      metolazone  (ZAROXOLYN ) 2.5 MG tablet Take 1 tablet (2.5 mg total) by mouth every Monday, Wednesday, and Friday. 90 tablet 0    mirtazapine  (REMERON ) 7.5 MG tablet Take 2 tablets (15 mg total) by mouth at bedtime. 180 tablet 0    Multiple Vitamins-Minerals (CENTRUM SILVER  50+WOMEN) TABS Take 1 tablet by mouth at bedtime.      omeprazole  (PRILOSEC) 40 MG capsule Take 1 capsule (40 mg total) by mouth every morning. 90 capsule 0    prazosin  (MINIPRESS ) 2 MG capsule Take 1 capsule (2 mg total) by  mouth at bedtime. (Patient not taking: Reported on 08/14/2023) 30  capsule 5    QUEtiapine  (SEROQUEL ) 100 MG tablet Take 100-200 mg by mouth 2 (two) times daily. Take one tablet by mouth in morning. Take two tablets by mouth in the afternoon.      torsemide  40 MG TABS Take 40 mg by mouth 2 (two) times daily. 60 tablet 3     Patient Stressors: Marital or family conflict    Patient Strengths: Communication skills   Treatment Modalities: Medication Management, Group therapy, Case management,  1 to 1 session with clinician, Psychoeducation, Recreational therapy.   Physician Treatment Plan for Primary Diagnosis: Schizoaffective disorder (HCC) Long Term Goal(s): Improvement in symptoms so as ready for discharge   Short Term Goals: Ability to identify changes in lifestyle to reduce recurrence of condition will improve Ability to verbalize feelings will improve Ability to disclose and discuss suicidal ideas Ability to demonstrate self-control will improve Ability to identify and develop effective coping behaviors will improve  Medication Management: Evaluate patient's response, side effects, and tolerance of medication regimen.  Therapeutic Interventions: 1 to 1 sessions, Unit Group sessions and Medication administration.  Evaluation of Outcomes: Not Progressing  Physician Treatment Plan for Secondary Diagnosis: Principal Problem:   Schizoaffective disorder (HCC)  Long Term Goal(s): Improvement in symptoms so as ready for discharge   Short Term Goals: Ability to identify changes in lifestyle to reduce recurrence of condition will improve Ability to verbalize feelings will improve Ability to disclose and discuss suicidal ideas Ability to demonstrate self-control will improve Ability to identify and develop effective coping behaviors will improve     Medication Management: Evaluate patient's response, side effects, and tolerance of medication regimen.  Therapeutic Interventions: 1 to 1  sessions, Unit Group sessions and Medication administration.  Evaluation of Outcomes: Not Progressing   RN Treatment Plan for Primary Diagnosis: Schizoaffective disorder (HCC) Long Term Goal(s): Knowledge of disease and therapeutic regimen to maintain health will improve  Short Term Goals: Ability to remain free from injury will improve, Ability to verbalize frustration and anger appropriately will improve, Ability to demonstrate self-control, Ability to participate in decision making will improve, Ability to verbalize feelings will improve, Ability to disclose and discuss suicidal ideas, Ability to identify and develop effective coping behaviors will improve, and Compliance with prescribed medications will improve  Medication Management: RN will administer medications as ordered by provider, will assess and evaluate patient's response and provide education to patient for prescribed medication. RN will report any adverse and/or side effects to prescribing provider.  Therapeutic Interventions: 1 on 1 counseling sessions, Psychoeducation, Medication administration, Evaluate responses to treatment, Monitor vital signs and CBGs as ordered, Perform/monitor CIWA, COWS, AIMS and Fall Risk screenings as ordered, Perform wound care treatments as ordered.  Evaluation of Outcomes: Not Progressing   LCSW Treatment Plan for Primary Diagnosis: Schizoaffective disorder (HCC) Long Term Goal(s): Safe transition to appropriate next level of care at discharge, Engage patient in therapeutic group addressing interpersonal concerns.  Short Term Goals: Engage patient in aftercare planning with referrals and resources, Increase social support, Increase ability to appropriately verbalize feelings, Increase emotional regulation, Facilitate acceptance of mental health diagnosis and concerns, Facilitate patient progression through stages of change regarding substance use diagnoses and concerns, Identify triggers associated  with mental health/substance abuse issues, and Increase skills for wellness and recovery  Therapeutic Interventions: Assess for all discharge needs, 1 to 1 time with Social worker, Explore available resources and support systems, Assess for adequacy in community support network, Educate family and significant other(s) on suicide prevention,  Complete Psychosocial Assessment, Interpersonal group therapy.  Evaluation of Outcomes: Not Progressing   Progress in Treatment: Attending groups: Yes. and No. Participating in groups: Yes. and No. Taking medication as prescribed: Yes. and No. Toleration medication: Yes. Family/Significant other contact made: No, will contact:  [name] will contact if given permission Patient understands diagnosis: Yes. Discussing patient identified problems/goals with staff: Yes. Medical problems stabilized or resolved: Yes. Denies suicidal/homicidal ideation: Yes. Issues/concerns per patient self-inventory: No. Other: None   New problem(s) identified: No, Describe:  None identified   New Short Term/Long Term Goal(s): elimination of symptoms of psychosis, medication management for mood stabilization; elimination of SI thoughts; development of comprehensive mental wellness plan.   Patient Goals:  My goal is to first try to get myself together, try to be in an environment with no kind of abuse or on the street  Discharge Plan or Barriers: [name] will assist with appropriate discharge planning   Reason for Continuation of Hospitalization: Depression Medication stabilization  Estimated Length of Stay: 1 to 7 days   Last 3 [place] Suicide Severity Risk Score: Flowsheet Row Admission (Current) from 08/15/2023 in [facility] ED from 08/13/2023 in [facility] at [facility] ED from 07/14/2023 in [facility] Emergency Department at [facility]  C-SSRS RISK CATEGORY No Risk No Risk No Risk    Last [facility] 2/9  Scores:    08/14/2023    8:50 AM 07/11/2022    1:42 PM 06/09/2022    3:51 PM  Depression screen PHQ 2/9  Decreased Interest 2 1 0  Down, Depressed, Hopeless 2  0  PHQ - 2 Score 4 1 0  Altered sleeping 0 0 0  Tired, decreased energy 2 1 3   Change in appetite 2 0 0  Feeling bad or failure about yourself  1 0 0  Trouble concentrating 1  0  Moving slowly or fidgety/restless 0 0 0  Suicidal thoughts 0 0 0  PHQ-9 Score 10 2 3   Difficult doing work/chores Very difficult Not difficult at all Somewhat difficult    Scribe for Treatment Team: Lum JONETTA Croft, [place] 08/17/2023 11:39 AM

## 2023-08-17 NOTE — Plan of Care (Signed)
  Problem: Health Behavior/Discharge Planning: Goal: Compliance with prescribed medication regimen will improve Outcome: Progressing   Problem: Nutritional: Goal: Ability to achieve adequate nutritional intake will improve Outcome: Progressing   
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
## 2023-08-17 NOTE — Group Note (Signed)
 Recreation Therapy Group Note   Group Topic:General Recreation  Group Date: 08/17/2023 Start Time: 1400 End Time: 1425 Facilitators: Celestia Jeoffrey BRAVO, LRT, CTRS Location: Courtyard  Group Description: Outdoor Recreation. Patients had the option to play corn hole, ring toss, bowling or listening to music while outside in the courtyard getting fresh air and sunlight. Patients helped water  and prune the raised garden beds. LRT and patients discussed things that they enjoy doing in their free time outside of the hospital. LRT encouraged patients to drink water  after being active and getting their heart rate up.    Goal Area(s) Addressed:   Patient will identify leisure interests.    Patient will practice healthy decision making.   Patient will engage in recreation activity.    Affect/Mood: N/A   Participation Level: Did not attend    Clinical Observations/Individualized Feedback: Patient did not attend group.   Plan: Continue to engage patient in RT group sessions 2-3x/week.   Jeoffrey BRAVO Celestia, LRT, CTRS 08/17/2023 3:53 PM

## 2023-08-17 NOTE — Group Note (Signed)
 Date:  08/17/2023 Time:  11:28 AM  Group Topic/Focus:  Goals/Karaoke Group:   The focus of this group is to help patients establish daily goals to achieve during treatment and discuss how the patient can incorporate goal setting into their daily lives to aide in recovery. Also pts did sing along to some of their favorite songs while interacting with peers.     Participation Level:  Active  Participation Quality:  Appropriate  Affect:  Appropriate  Cognitive:  Appropriate  Insight: Appropriate  Engagement in Group:  Engaged  Modes of Intervention:  Activity and Discussion  Additional Comments:     Beatris ONEIDA Hasten 08/17/2023, 11:28 AM

## 2023-08-17 NOTE — Progress Notes (Addendum)
   08/17/23 0406  Psych Admission Type (Psych Patients Only)  Admission Status Voluntary  Psychosocial Assessment  Patient Complaints Depression;Restlessness  Eye Contact Brief  Facial Expression Animated  Affect Blunted  Speech Logical/coherent  Interaction Assertive;Needy  Motor Activity Unsteady  Appearance/Hygiene In scrubs;Body odor  Behavior Characteristics Cooperative;Appropriate to situation  Mood Sullen;Irritable  Aggressive Behavior  Effect No apparent injury  Thought Process  Coherency Concrete thinking  Content Paranoia  Delusions None reported or observed  Perception WDL  Hallucination None reported or observed  Judgment Limited  Confusion Mild  Danger to Self  Current suicidal ideation? Denies  Danger to Others  Danger to Others None reported or observed   Estimated Sleeping Duration (Last 24 Hours): 7.50-8.50 hours   (Sleep Hours) - 7.5-8.50 hrs (Any PRNs that were needed, meds refused, or side effects to meds)-  capsaicin  0.02% and vistaril  10mg  for pain and anxiety. (Any disturbances and when (visitation, over night)- none (Concerns raised by the patient)- none (SI/HI/AVH)- denied all

## 2023-08-17 NOTE — Progress Notes (Signed)
 [facility]  ROUNDING NOTE   Subjective:   Tiffany [name]  is a 63 y.o.  female  with an extensive past medical history including schizophrenia, hypertension, CHF, diabetes, stroke, and end-stage renal disease on hemodialysis. [name] currently admitted at inpatient behavioral health for Schizoaffective disorder [facility]) [F25.9]  Patient is known to our practice from previous admission.  States she has missed 5 days of dialysis outpatient.  Did not feel like going.  Patient seen and evaluated during.   HEMODIALYSIS FLOWSHEET:  Blood Flow Rate (mL/min): 0 mL/min Arterial Pressure (mmHg): -49.09 mmHg Venous Pressure (mmHg): 27.67 mmHg TMP (mmHg): 13.13 mmHg Ultrafiltration Rate (mL/min): 0 mL/min Dialysate Flow Rate (mL/min): 299 ml/min  Labs collected 7/30 showed sodium 131, potassium 2.7, BUN 77, creatinine 0.75 with GFR 5.  Collected labs with dialysis, potassium 3.2, BUN 57, hemoglobin 8.0.  Will continue to manage dialysis needs.  Objective:  Vital signs in last 24 hours:  Temp:  [97.2 F (36.2 C)-99 F (37.2 C)] 98.2 F (36.8 C) (08/01 1605) Pulse Rate:  [67-79] 76 (08/01 1605) Resp:  [16-20] 17 (08/01 1605) BP: (120-143)/(66-95) 137/77 (08/01 1605) SpO2:  [100 %] 100 % (08/01 1605) Weight:  [84.9 kg] 84.9 kg (08/01 1314)  Weight change:  Filed Weights   08/17/23 1314  Weight: 84.9 kg    Intake/Output: No intake/output data recorded.   Intake/Output this shift:  No intake/output data recorded.  Physical Exam: General: NAD, seated in chair  Head: Normocephalic, atraumatic. Moist oral mucosal membranes  Eyes: Anicteric  Neck: Supple  Lungs:  Clear to auscultation, normal effort  Heart: Regular rate and rhythm  Abdomen:  Soft, nontender  Extremities: Trace peripheral edema.  Neurologic: Awake, alert, conversant  Skin: Warm,dry, no rash  Access: Right chest IJ PermCath    Basic Metabolic Panel: Recent Labs  Lab 08/13/23 2209 08/15/23 1101  08/17/23 1430  NA 134* 131* 135  K 2.9* 2.7* 3.2*  CL 92* 90* 95*  CO2 23 26 29   GLUCOSE 70 89 118*  BUN 71* 77* 57*  CREATININE 7.95* 7.75* 4.26*  CALCIUM  9.4 8.6* 8.7*  PHOS  --   --  2.3*    Liver Function Tests: Recent Labs  Lab 08/17/23 1430  ALBUMIN  3.0*   No results for input(s): LIPASE, AMYLASE in the last 168 hours. No results for input(s): AMMONIA  in the last 168 hours.  CBC: Recent Labs  Lab 08/13/23 2209 08/17/23 1430  WBC 10.5 6.8  HGB 8.4* 8.0*  HCT 26.5* 25.6*  MCV 92.0 91.4  PLT 441* 355    Cardiac Enzymes: No results for input(s): CKTOTAL, CKMB, CKMBINDEX, TROPONINI in the last 168 hours.  BNP: Invalid input(s): POCBNP  CBG: No results for input(s): GLUCAP in the last 168 hours.  Microbiology: Results for orders placed or performed during the hospital encounter of 08/13/23  SARS Coronavirus 2 by RT PCR (hospital order, performed in [facility] hospital lab) *cepheid single result test* Anterior Nasal Swab     Status: None   Collection Time: 08/15/23 11:07 AM   Specimen: Anterior Nasal Swab  Result Value Ref Range Status   SARS Coronavirus 2 by RT PCR NEGATIVE NEGATIVE Final    Comment: Performed at [facility], [address], [place], [place] [address]   *Note: Due to a large number of results and/or encounters for the requested time period, some results have not been displayed. A complete set of results can be found in Results Review.  Coagulation Studies: No results for input(s): LABPROT, INR in the last 72 hours.  Urinalysis: No results for input(s): COLORURINE, LABSPEC, PHURINE, GLUCOSEU, HGBUR, BILIRUBINUR, KETONESUR, PROTEINUR, UROBILINOGEN, NITRITE, LEUKOCYTESUR in the last 72 hours.  Invalid input(s): APPERANCEUR    Imaging: No results found.   Medications:     amLODipine   5 mg Oral BID   atorvastatin   20 mg Oral Daily   calcitRIOL   0.5 mcg Oral Daily   carvedilol    12.5 mg Oral BID   Chlorhexidine  Gluconate Cloth  6 each Topical Q0600   dicyclomine   10 mg Oral TID   escitalopram   5 mg Oral Daily   ezetimibe   10 mg Oral Daily   ferrous sulfate   325 mg Oral Daily   folic acid   1 mg Oral Daily   gabapentin   100 mg Oral QHS   melatonin  5 mg Oral QHS   metolazone   2.5 mg Oral Q M,W,F   mirtazapine   15 mg Oral QHS   OLANZapine   2.5 mg Oral Daily   OLANZapine   5 mg Oral QHS   torsemide   40 mg Oral BID   albuterol , capsaicin , hydrOXYzine , OLANZapine , OLANZapine  zydis  Assessment/ Plan:  Tiffany [name] is a 63 y.o.  female  with an extensive past medical history including schizophrenia, hypertension, CHF, diabetes, stroke, and end-stage renal disease on hemodialysis.   End-stage renal disease on hemodialysis.  Will maintain outpatient  TTS schedule during this admission.  Patient received dialysis today, UF goal reduced in the room due to cramping.  Patient terminated treatment early due to discomfort.  Will schedule next treatment for Saturday.  2. Anemia of chronic kidney disease Lab Results  Component Value Date   HGB 8.0 (L) 08/17/2023    Hemoglobin below optimal range.  Will order Retacrit  with dialysis.  3. Secondary Hyperparathyroidism: with outpatient labs: PTH 1310, phosphorus 5.9, calcium  8.4 on 07/26/2023.   Lab Results  Component Value Date   PTH 57 02/18/2020   CALCIUM  8.7 (L) 08/17/2023   CAION 1.03 (L) 06/14/2023   PHOS 2.3 (L) 08/17/2023      LOS: 2 Tiffany [name] 8/1/20255:01 PM

## 2023-08-17 NOTE — Group Note (Signed)
 Physical/Occupational Therapy Group Note  Group Topic: Neurographic Art  Group Date: 08/17/2023 Start Time: 1315 End Time: 1355 Facilitators: Clive Warren CROME, OT   Group Description: Group participated with Neurographic art activity, using watercolor paints to facilitate creative expression and meditation/relaxation for each individual.  Incorporated bimanual coordination, mental focus, emotional processing, task/command following and relaxation techniques as appropriate.  Patients engaged socially with therapist and other group participants throughout session. Allowed to ask questions as appropriate, and encouraged to identify ways they could use/share their creations with themselves and others.  Therapeutic Goal(s):  Demonstrate ability to independently manipulate utensils required to participate with and complete activity. Demonstrate ability to cognitively focus on task and follow commands necessary for completion. Demonstrate use of art as an outlet for emotional processing and expression. Identify and demonstrate importance of relaxation, neural calming and meditation for improved participation with life groups.  Individual Participation: Did not attend.   Participation Level: Did not attend   Participation Quality:   Behavior:   Speech/Thought Process:   Affect/Mood:   Insight:   Judgement:   Modes of Intervention:   Patient Response to Interventions:    Plan: Continue to engage patient in PT/OT groups 1 - 2x/week.  Rashmi Tallent R., MPH, MS, OTR/L ascom [phone] 08/17/23, 2:29 PM

## 2023-08-17 NOTE — Progress Notes (Signed)
 Patient exited to dialysis.

## 2023-08-17 NOTE — Progress Notes (Addendum)
  Received patient in bed to unit.   Informed consent signed and in chart.    TX duration:3.30     Transported by  Hand-off given to patient's nurse.    Access used: cath Access issues: high venous and arterial in the beginning of tx.   Pt came off machine 1 hr and 20 mins early d/t not feeling well. AMA signed.  Total UF removed: 0 Medication(s) given: none Post HD VS: wnl  Post HD weight: 85.0     N. Allura Doepke LPN [facility]

## 2023-08-17 NOTE — Progress Notes (Deleted)
  Received patient in bed to unit.   Informed consent signed and in chart.    TX duration:3.30     Transported by  Hand-off given to patient's nurse.    Access used: cath Access issues: none   Total UF removed: 0 Medication(s) given: none Post HD VS: wnl Post HD weight: 85.0     N. Lacreasha Hinds LPN [facility]

## 2023-08-17 NOTE — Progress Notes (Signed)
 Patient has returned from dialysis

## 2023-08-18 DIAGNOSIS — F259 Schizoaffective disorder, unspecified: Secondary | ICD-10-CM | POA: Diagnosis not present

## 2023-08-18 MED ORDER — PALIPERIDONE ER 3 MG PO TB24
9.0000 mg | ORAL_TABLET | Freq: Every day | ORAL | Status: DC
  Administered 2023-08-19 – 2023-08-21 (×3): 9 mg via ORAL
  Filled 2023-08-18 (×3): qty 3

## 2023-08-18 MED ORDER — DOCUSATE SODIUM 100 MG PO CAPS
100.0000 mg | ORAL_CAPSULE | Freq: Every day | ORAL | Status: DC
  Administered 2023-08-18 – 2023-09-04 (×19): 100 mg via ORAL
  Filled 2023-08-18 (×21): qty 1

## 2023-08-18 MED ORDER — PANTOPRAZOLE SODIUM 40 MG PO TBEC
40.0000 mg | DELAYED_RELEASE_TABLET | Freq: Every day | ORAL | Status: DC
  Administered 2023-08-18 – 2023-09-07 (×24): 40 mg via ORAL
  Filled 2023-08-18 (×22): qty 1

## 2023-08-18 MED ORDER — PALIPERIDONE ER 3 MG PO TB24
3.0000 mg | ORAL_TABLET | ORAL | Status: AC
  Administered 2023-08-18: 3 mg via ORAL
  Filled 2023-08-18: qty 1
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
## 2023-08-18 NOTE — Group Note (Signed)
 Date:  08/18/2023 Time:  10:54 PM  Group Topic/Focus:  Identifying Needs:   The focus of this group is to help patients identify their personal needs that have been historically problematic and identify healthy behaviors to address their needs.    Participation Level:  Active  Participation Quality:  Appropriate  Affect:  Appropriate  Cognitive:  Appropriate  Insight: Appropriate  Engagement in Group:  Engaged  Modes of Intervention:  Education and Exploration  Additional Comments:    Tiffany [name] 08/18/2023, 10:54 PM
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
## 2023-08-18 NOTE — Group Note (Signed)
 Date:  08/18/2023 Time:  6:02 PM  Group Topic/Focus:  Early Warning Signs:   The focus of this group is to help patients identify signs or symptoms they exhibit before slipping into an unhealthy state or crisis.    Participation Level:  Active  Participation Quality:  Appropriate  Affect:  Appropriate  Cognitive:  Appropriate  Insight: Appropriate  Engagement in Group:  Engaged  Modes of Intervention:  Activity  Additional Comments:    Camellia HERO Rustyn Conery 08/18/2023, 6:02 PM

## 2023-08-18 NOTE — Plan of Care (Signed)
 ?  Problem: Education: ?Goal: Knowledge of the prescribed therapeutic regimen will improve ?Outcome: Progressing ?  ?Problem: Coping: ?Goal: Coping ability will improve ?Outcome: Progressing ?Goal: Will verbalize feelings ?Outcome: Progressing ?  ?

## 2023-08-18 NOTE — [facility] Suicide Risk Assessment (Signed)
 [facility] INPATIENT:  Family/Significant Other Suicide Prevention Education  Suicide Prevention Education:  Patient Refusal for Family/Significant Other Suicide Prevention Education: The patient Tiffany [name] has refused to provide written consent for family/significant other to be provided Family/Significant Other Suicide Prevention Education during admission and/or prior to discharge.  Physician notified.  Pamila Nine 08/18/2023, 11:14 AM

## 2023-08-18 NOTE — Progress Notes (Signed)
   08/18/23 1200  Psych Admission Type (Psych Patients Only)  Admission Status Voluntary  Psychosocial Assessment  Patient Complaints Depression  Eye Contact Brief  Facial Expression Animated  Affect Appropriate to circumstance  Speech Logical/coherent  Interaction Assertive  Motor Activity Unsteady  Appearance/Hygiene In scrubs  Behavior Characteristics Cooperative  Mood Depressed  Thought Process  Coherency Concrete thinking  Content Paranoia  Delusions None reported or observed  Perception WDL  Hallucination None reported or observed  Judgment Limited  Confusion Mild  Danger to Self  Current suicidal ideation? Denies  Danger to Others  Danger to Others None reported or observed

## 2023-08-18 NOTE — Group Note (Signed)
 Date:  08/18/2023 Time:  4:04 PM  Group Topic/Focus:  Crisis Planning:   The purpose of this group is to help patients create a crisis plan for use upon discharge or in the future, as needed.    Participation Level:  Active  Participation Quality:  Appropriate  Affect:  Appropriate  Cognitive:  Appropriate  Insight: Appropriate  Engagement in Group:  Engaged  Modes of Intervention:  Activity  Additional Comments:    Camellia HERO Brayon Bielefeld 08/18/2023, 4:04 PM

## 2023-08-18 NOTE — Plan of Care (Signed)
   Problem: Education: Goal: Utilization of techniques to improve thought processes will improve Outcome: Progressing Goal: Knowledge of the prescribed therapeutic regimen will improve Outcome: Progressing

## 2023-08-18 NOTE — Group Note (Signed)
 Date:  08/18/2023 Time:  6:31 AM  Group Topic/Focus:  Goals Group:   The focus of this group is to help patients establish daily goals to achieve during treatment and discuss how the patient can incorporate goal setting into their daily lives to aide in recovery. Relapse Prevention Planning:   The focus of this group is to define relapse and discuss the need for planning to combat relapse. Wrap-Up Group:   The focus of this group is to help patients review their daily goal of treatment and discuss progress on daily workbooks.    Participation Level:  Active  Participation Quality:  Sharing  Affect:  Appropriate  Cognitive:  Appropriate  Insight: Good  Engagement in Group:  Engaged  Modes of Intervention:  Discussion  Additional Comments:    Kristen VEAR Gibbon 08/18/2023, 6:31 AM

## 2023-08-18 NOTE — Progress Notes (Signed)
   08/18/23 0400  Psych Admission Type (Psych Patients Only)  Admission Status Voluntary  Psychosocial Assessment  Patient Complaints Depression  Eye Contact Brief  Facial Expression Animated  Affect Appropriate to circumstance  Speech Logical/coherent  Interaction Assertive  Motor Activity Unsteady  Appearance/Hygiene In scrubs  Behavior Characteristics Cooperative  Mood Depressed  Thought Process  Coherency Concrete thinking  Content Paranoia  Delusions None reported or observed  Perception WDL  Hallucination None reported or observed  Judgment Limited  Confusion Mild  Danger to Self  Current suicidal ideation? Denies  Danger to Others  Danger to Others None reported or observed

## 2023-08-19 DIAGNOSIS — F259 Schizoaffective disorder, unspecified: Secondary | ICD-10-CM | POA: Diagnosis not present

## 2023-08-19 NOTE — Group Note (Signed)
 [facility] LCSW Group Therapy Note   Group Date: 08/19/2023 Start Time: 1300 End Time: 1400   Type of Therapy/Topic:  Group Therapy:  Emotion Regulation  Participation Level:  Active   Mood:  Description of Group:    The purpose of this group is to assist patients in learning to regulate negative emotions and experience positive emotions. Patients will be guided to discuss ways in which they have been vulnerable to their negative emotions. These vulnerabilities will be juxtaposed with experiences of positive emotions or situations, and patients challenged to use positive emotions to combat negative ones. Special emphasis will be placed on coping with negative emotions in conflict situations, and patients will process healthy conflict resolution skills.  Therapeutic Goals: Patient will identify two positive emotions or experiences to reflect on in order to balance out negative emotions:  Patient will label two or more emotions that they find the most difficult to experience:  Patient will be able to demonstrate positive conflict resolution skills through discussion or role plays:   Summary of Patient Progress:   Patient was engaged and participated in discussion during group.     Therapeutic Modalities:   Cognitive Behavioral Therapy Feelings Identification Dialectical Behavioral Therapy   Pamila Nine, LCSW

## 2023-08-19 NOTE — Progress Notes (Signed)
 Tiffany [name] is a 63 y.o. female patient. No diagnosis found. Past Medical History:  Diagnosis Date   Abdominal pain    Acute encephalopathy 11/21/2017   Acute exacerbation of CHF (congestive heart failure) (HCC) 03/03/2022   Acute GI bleeding 03/29/2021   Acute metabolic encephalopathy 02/20/2020   Aggressive behavior    Agitation 11/22/2017   Anoxic brain injury (HCC) 09/08/2016   C. Arrest due to respiratory failure and COPD exacerbation   Anxiety    Arthritis    all over (04/10/2016)   Asthma 10/18/2010   Binge eating disorder    Blood loss anemia 04/24/2011   CAP (community acquired pneumonia) 06/22/2015   Cardiac arrest (HCC) 09/08/2016   PEA   Carotid artery stenosis    1-39% bilateral by dopplers 11/2016   Chronic diastolic (congestive) heart failure (HCC)    Chronic pain syndrome 06/18/2012   Chronic post-traumatic stress disorder (PTSD) 05/27/2018   Chronic respiratory failure with hypoxia and hypercapnia (HCC) 06/22/2015   TRILOGY Vent >AVAPA-ES., Vt target 200-400, Max P 30 , PS max 20 , PS min 6-10 , E Max 6, E Min 4, Rate Auto AVAPS Rate 2 (titrate for pt comfort) , bleed O2 at 5l/m continuous flow .    Closed displaced fracture of fifth metacarpal bone 03/21/2018   Cocaine use disorder, severe, in sustained remission (HCC) 12/17/2015   Complication of anesthesia    decreased bp, decreased heart rate   COPD (chronic obstructive pulmonary disease) (HCC) 07/08/2014   Delusional disorder, persecutory type (HCC) 06/26/2021   Depression    Diabetic neuropathy (HCC) 04/24/2011   Difficulty with speech 01/24/2018   Disorder of nervous system    Drug abuse (HCC) 11/21/2017   Dyslipidemia 04/24/2011   E. coli UTI 02/20/2020   Elevated troponin 04/28/2012   Emphysema    Encephalopathy 11/21/2017   Essential hypertension 03/22/2016   Facet arthropathy, lumbar 06/12/2022   Fibula fracture 07/10/2016   Frequent falls 10/11/2017   GERD (gastroesophageal reflux  disease)    GI bleed 03/30/2021   Gout 04/11/2017   Heart attack (HCC) 1980s   Hematochezia    History of blood transfusion 1994   couldn't stop bleeding from my period   History of drug abuse in remission (HCC) 11/28/2015   Quit in 2017   Hyperlipidemia 03/31/2021   Hyperlipidemia LDL goal <70    Hypocalcemia    Hypokalemia    Hypomagnesemia    Incontinence    Manic depression (HCC)    Morbid obesity (HCC) 10/18/2010   Obstructive sleep apnea 10/18/2010   On home oxygen  therapy    6L; 24/7 (04/10/2016)   OSA on CPAP    wear mask sometimes (04/10/2016)   Painless rectal bleeding 12/31/2015   Paranoid (HCC)    sometimes; I'm on RX for it (04/10/2016)   Periumbilical abdominal pain    Prolonged Q-T interval on ECG    Rectal bleeding 12/31/2015   Recurrent syncope 07/09/2021   Rhabdomyolysis 06/16/2021   Schizoaffective disorder, bipolar type (HCC) 04/05/2018   Seasonal allergies    Seborrheic keratoses 12/31/2013   Seizures (HCC)    don't know what kind; last one was ~ 1 yr ago (04/10/2016)   Sinus bradycardia    Skin ulcer of sacrum, limited to breakdown of skin (HCC) 03/08/2022   Spondylolisthesis at L3-L4 level 06/12/2022   Stroke (HCC) 1980s   denies residual on 04/10/2016   Thrush 09/19/2013   Type 2 diabetes mellitus (HCC) 10/18/2010   Current  Facility-Administered Medications  Medication Dose Route Frequency Provider Last Rate Last Admin   albuterol  (VENTOLIN  HFA) 108 (90 Base) MCG/ACT inhaler 2 puff  2 puff Inhalation Q6H PRN Mannie Jerel PARAS, NP   2 puff at 08/18/23 2319   amLODipine  (NORVASC ) tablet 5 mg  5 mg Oral BID Mannie Jerel PARAS, NP   5 mg at 08/18/23 2246   atorvastatin  (LIPITOR ) tablet 20 mg  20 mg Oral Daily Mannie Jerel PARAS, NP   20 mg at 08/18/23 1117   calcitRIOL  (ROCALTROL ) capsule 0.5 mcg  0.5 mcg Oral Daily Jadapalle, Sree, MD   0.5 mcg at 08/18/23 1117   capsaicin  (ZOSTRIX) 0.025 % cream   Topical BID PRN Onuoha, Chinwendu V, NP   Given  at 08/18/23 2318   carvedilol  (COREG ) tablet 12.5 mg  12.5 mg Oral BID Mannie Jerel PARAS, NP   12.5 mg at 08/18/23 2248   Chlorhexidine  Gluconate Cloth 2 % PADS 6 each  6 each Topical Q0600 Druscilla Bald, NP       dicyclomine  (BENTYL ) capsule 10 mg  10 mg Oral TID Mannie Jerel PARAS, NP   10 mg at 08/18/23 2249   docusate sodium  (COLACE) capsule 100 mg  100 mg Oral Daily Shrivastava, Aryendra, MD   100 mg at 08/18/23 1117   escitalopram  (LEXAPRO ) tablet 5 mg  5 mg Oral Daily Mannie Jerel PARAS, NP   5 mg at 08/18/23 1117   ezetimibe  (ZETIA ) tablet 10 mg  10 mg Oral Daily Jadapalle, Sree, MD   10 mg at 08/18/23 1118   ferrous sulfate  tablet 325 mg  325 mg Oral Daily Jadapalle, Sree, MD   325 mg at 08/18/23 1118   folic acid  (FOLVITE ) tablet 1 mg  1 mg Oral Daily Jadapalle, Sree, MD   1 mg at 08/18/23 1117   gabapentin  (NEURONTIN ) capsule 100 mg  100 mg Oral QHS Mannie Jerel PARAS, NP   100 mg at 08/18/23 2247   hydrOXYzine  (ATARAX ) tablet 10 mg  10 mg Oral TID PRN Mannie Jerel PARAS, NP   10 mg at 08/16/23 2153   melatonin tablet 5 mg  5 mg Oral QHS Onuoha, Chinwendu V, NP   5 mg at 08/18/23 2245   metolazone  (ZAROXOLYN ) tablet 2.5 mg  2.5 mg Oral Q M,W,F Jadapalle, Sree, MD   2.5 mg at 08/17/23 9075   mirtazapine  (REMERON ) tablet 15 mg  15 mg Oral QHS Jadapalle, Sree, MD   15 mg at 08/18/23 2245   OLANZapine  (ZYPREXA ) injection 5 mg  5 mg Intramuscular TID PRN Mannie Jerel PARAS, NP       OLANZapine  zydis (ZYPREXA ) disintegrating tablet 5 mg  5 mg Oral TID PRN Mannie Jerel PARAS, NP   5 mg at 08/18/23 2318   paliperidone  (INVEGA ) 24 hr tablet 9 mg  9 mg Oral Daily Shrivastava, Aryendra, MD       pantoprazole  (PROTONIX ) EC tablet 40 mg  40 mg Oral Daily Shrivastava, Aryendra, MD   40 mg at 08/18/23 1116   torsemide  (DEMADEX ) tablet 40 mg  40 mg Oral BID Mannie Jerel PARAS, NP   40 mg at 08/18/23 1701   Allergies  Allergen Reactions   Dorethia Bile ] Other (See Comments)    GI bleed   Deltasone  [Prednisone ]  Anaphylaxis and Swelling   Latuda [Lurasidone Hcl] Anaphylaxis   Lortab [Hydrocodone -Acetaminophen ] Hives and Shortness Of Breath   Magnesium -Containing Compounds Anaphylaxis and Other (See Comments)    Tolerated Ensure   Ultram  [Tramadol ]  Anaphylaxis, Swelling and Rash   Codeine Nausea And Vomiting and Rash   Desyrel  [Trazodone ] Other (See Comments)    Paranoia    Peanut (Diagnostic) Hives and Other (See Comments)    Raw peanuts only  OK to eat peanut butter.   Apresoline  [Hydralazine ] Other (See Comments)    Muscle spasms   Sulfa Antibiotics Itching   Tape Rash   Topamax  [Topiramate ] Other (See Comments)    Paranoia   Principal Problem:   Schizoaffective disorder (HCC)  Blood pressure 135/70, pulse 74, temperature (!) 97.3 F (36.3 C), resp. rate 16, weight 85 kg, SpO2 99%.  Subjective Objective: Vital signs: (most recent): Blood pressure 135/70, pulse 74, temperature (!) 97.3 F (36.3 C), resp. rate 16, weight 85 kg, SpO2 99%.    Assessment & Plan  [place] B Berneta Sconyers 08/19/2023

## 2023-08-19 NOTE — Plan of Care (Signed)
 Tiffany [name] is a 63 y.o. female patient. No diagnosis found. Past Medical History:  Diagnosis Date   Abdominal pain    Acute encephalopathy 11/21/2017   Acute exacerbation of CHF (congestive heart failure) (HCC) 03/03/2022   Acute GI bleeding 03/29/2021   Acute metabolic encephalopathy 02/20/2020   Aggressive behavior    Agitation 11/22/2017   Anoxic brain injury (HCC) 09/08/2016   C. Arrest due to respiratory failure and COPD exacerbation   Anxiety    Arthritis    all over (04/10/2016)   Asthma 10/18/2010   Binge eating disorder    Blood loss anemia 04/24/2011   CAP (community acquired pneumonia) 06/22/2015   Cardiac arrest (HCC) 09/08/2016   PEA   Carotid artery stenosis    1-39% bilateral by dopplers 11/2016   Chronic diastolic (congestive) heart failure (HCC)    Chronic pain syndrome 06/18/2012   Chronic post-traumatic stress disorder (PTSD) 05/27/2018   Chronic respiratory failure with hypoxia and hypercapnia (HCC) 06/22/2015   TRILOGY Vent >AVAPA-ES., Vt target 200-400, Max P 30 , PS max 20 , PS min 6-10 , E Max 6, E Min 4, Rate Auto AVAPS Rate 2 (titrate for pt comfort) , bleed O2 at 5l/m continuous flow .    Closed displaced fracture of fifth metacarpal bone 03/21/2018   Cocaine use disorder, severe, in sustained remission (HCC) 12/17/2015   Complication of anesthesia    decreased bp, decreased heart rate   COPD (chronic obstructive pulmonary disease) (HCC) 07/08/2014   Delusional disorder, persecutory type (HCC) 06/26/2021   Depression    Diabetic neuropathy (HCC) 04/24/2011   Difficulty with speech 01/24/2018   Disorder of nervous system    Drug abuse (HCC) 11/21/2017   Dyslipidemia 04/24/2011   E. coli UTI 02/20/2020   Elevated troponin 04/28/2012   Emphysema    Encephalopathy 11/21/2017   Essential hypertension 03/22/2016   Facet arthropathy, lumbar 06/12/2022   Fibula fracture 07/10/2016   Frequent falls 10/11/2017   GERD (gastroesophageal reflux  disease)    GI bleed 03/30/2021   Gout 04/11/2017   Heart attack (HCC) 1980s   Hematochezia    History of blood transfusion 1994   couldn't stop bleeding from my period   History of drug abuse in remission (HCC) 11/28/2015   Quit in 2017   Hyperlipidemia 03/31/2021   Hyperlipidemia LDL goal <70    Hypocalcemia    Hypokalemia    Hypomagnesemia    Incontinence    Manic depression (HCC)    Morbid obesity (HCC) 10/18/2010   Obstructive sleep apnea 10/18/2010   On home oxygen  therapy    6L; 24/7 (04/10/2016)   OSA on CPAP    wear mask sometimes (04/10/2016)   Painless rectal bleeding 12/31/2015   Paranoid (HCC)    sometimes; I'm on RX for it (04/10/2016)   Periumbilical abdominal pain    Prolonged Q-T interval on ECG    Rectal bleeding 12/31/2015   Recurrent syncope 07/09/2021   Rhabdomyolysis 06/16/2021   Schizoaffective disorder, bipolar type (HCC) 04/05/2018   Seasonal allergies    Seborrheic keratoses 12/31/2013   Seizures (HCC)    don't know what kind; last one was ~ 1 yr ago (04/10/2016)   Sinus bradycardia    Skin ulcer of sacrum, limited to breakdown of skin (HCC) 03/08/2022   Spondylolisthesis at L3-L4 level 06/12/2022   Stroke (HCC) 1980s   denies residual on 04/10/2016   Thrush 09/19/2013   Type 2 diabetes mellitus (HCC) 10/18/2010   Current  Facility-Administered Medications  Medication Dose Route Frequency Provider Last Rate Last Admin   albuterol  (VENTOLIN  HFA) 108 (90 Base) MCG/ACT inhaler 2 puff  2 puff Inhalation Q6H PRN Mannie Jerel PARAS, NP   2 puff at 08/18/23 2319   amLODipine  (NORVASC ) tablet 5 mg  5 mg Oral BID Mannie Jerel PARAS, NP   5 mg at 08/18/23 2246   atorvastatin  (LIPITOR ) tablet 20 mg  20 mg Oral Daily Mannie Jerel PARAS, NP   20 mg at 08/18/23 1117   calcitRIOL  (ROCALTROL ) capsule 0.5 mcg  0.5 mcg Oral Daily Jadapalle, Sree, MD   0.5 mcg at 08/18/23 1117   capsaicin  (ZOSTRIX) 0.025 % cream   Topical BID PRN Onuoha, Chinwendu V, NP   Given  at 08/18/23 2318   carvedilol  (COREG ) tablet 12.5 mg  12.5 mg Oral BID Mannie Jerel PARAS, NP   12.5 mg at 08/18/23 2248   Chlorhexidine  Gluconate Cloth 2 % PADS 6 each  6 each Topical Q0600 Druscilla Bald, NP       dicyclomine  (BENTYL ) capsule 10 mg  10 mg Oral TID Mannie Jerel PARAS, NP   10 mg at 08/18/23 2249   docusate sodium  (COLACE) capsule 100 mg  100 mg Oral Daily Shrivastava, Aryendra, MD   100 mg at 08/18/23 1117   escitalopram  (LEXAPRO ) tablet 5 mg  5 mg Oral Daily Mannie Jerel PARAS, NP   5 mg at 08/18/23 1117   ezetimibe  (ZETIA ) tablet 10 mg  10 mg Oral Daily Jadapalle, Sree, MD   10 mg at 08/18/23 1118   ferrous sulfate  tablet 325 mg  325 mg Oral Daily Jadapalle, Sree, MD   325 mg at 08/18/23 1118   folic acid  (FOLVITE ) tablet 1 mg  1 mg Oral Daily Jadapalle, Sree, MD   1 mg at 08/18/23 1117   gabapentin  (NEURONTIN ) capsule 100 mg  100 mg Oral QHS Mannie Jerel PARAS, NP   100 mg at 08/18/23 2247   hydrOXYzine  (ATARAX ) tablet 10 mg  10 mg Oral TID PRN Mannie Jerel PARAS, NP   10 mg at 08/16/23 2153   melatonin tablet 5 mg  5 mg Oral QHS Onuoha, Chinwendu V, NP   5 mg at 08/18/23 2245   metolazone  (ZAROXOLYN ) tablet 2.5 mg  2.5 mg Oral Q M,W,F Jadapalle, Sree, MD   2.5 mg at 08/17/23 9075   mirtazapine  (REMERON ) tablet 15 mg  15 mg Oral QHS Jadapalle, Sree, MD   15 mg at 08/18/23 2245   OLANZapine  (ZYPREXA ) injection 5 mg  5 mg Intramuscular TID PRN Mannie Jerel PARAS, NP       OLANZapine  zydis (ZYPREXA ) disintegrating tablet 5 mg  5 mg Oral TID PRN Mannie Jerel PARAS, NP   5 mg at 08/18/23 2318   paliperidone  (INVEGA ) 24 hr tablet 9 mg  9 mg Oral Daily Shrivastava, Aryendra, MD       pantoprazole  (PROTONIX ) EC tablet 40 mg  40 mg Oral Daily Shrivastava, Aryendra, MD   40 mg at 08/18/23 1116   torsemide  (DEMADEX ) tablet 40 mg  40 mg Oral BID Mannie Jerel PARAS, NP   40 mg at 08/18/23 1701   Allergies  Allergen Reactions   Dorethia Griffiths ] Other (See Comments)    GI bleed   Deltasone  [Prednisone ]  Anaphylaxis and Swelling   Latuda [Lurasidone Hcl] Anaphylaxis   Lortab [Hydrocodone -Acetaminophen ] Hives and Shortness Of Breath   Magnesium -Containing Compounds Anaphylaxis and Other (See Comments)    Tolerated Ensure   Ultram  [Tramadol ]  Anaphylaxis, Swelling and Rash   Codeine Nausea And Vomiting and Rash   Desyrel  [Trazodone ] Other (See Comments)    Paranoia    Peanut (Diagnostic) Hives and Other (See Comments)    Raw peanuts only  OK to eat peanut butter.   Apresoline  [Hydralazine ] Other (See Comments)    Muscle spasms   Sulfa Antibiotics Itching   Tape Rash   Topamax  [Topiramate ] Other (See Comments)    Paranoia   Principal Problem:   Schizoaffective disorder (HCC)  Blood pressure 135/70, pulse 74, temperature (!) 97.3 F (36.3 C), resp. rate 16, weight 85 kg, SpO2 99%.  Subjective Objective Assessment & Plan  Alishea Beaudin B Trevelle Mcgurn 08/19/2023

## 2023-08-19 NOTE — Progress Notes (Signed)
 [name] is a 63 y.o. female patient. No diagnosis found. Past Medical History:  Diagnosis Date   Abdominal pain    Acute encephalopathy 11/21/2017   Acute exacerbation of CHF (congestive heart failure) (HCC) 03/03/2022   Acute GI bleeding 03/29/2021   Acute metabolic encephalopathy 02/20/2020   Aggressive behavior    Agitation 11/22/2017   Anoxic brain injury (HCC) 09/08/2016   C. Arrest due to respiratory failure and COPD exacerbation   Anxiety    Arthritis    all over (04/10/2016)   Asthma 10/18/2010   Binge eating disorder    Blood loss anemia 04/24/2011   CAP (community acquired pneumonia) 06/22/2015   Cardiac arrest (HCC) 09/08/2016   PEA   Carotid artery stenosis    1-39% bilateral by dopplers 11/2016   Chronic diastolic (congestive) heart failure (HCC)    Chronic pain syndrome 06/18/2012   Chronic post-traumatic stress disorder (PTSD) 05/27/2018   Chronic respiratory failure with hypoxia and hypercapnia (HCC) 06/22/2015   TRILOGY Vent >AVAPA-ES., Vt target 200-400, Max P 30 , PS max 20 , PS min 6-10 , E Max 6, E Min 4, Rate Auto AVAPS Rate 2 (titrate for pt comfort) , bleed O2 at 5l/m continuous flow .    Closed displaced fracture of fifth metacarpal bone 03/21/2018   Cocaine use disorder, severe, in sustained remission (HCC) 12/17/2015   Complication of anesthesia    decreased bp, decreased heart rate   COPD (chronic obstructive pulmonary disease) (HCC) 07/08/2014   Delusional disorder, persecutory type (HCC) 06/26/2021   Depression    Diabetic neuropathy (HCC) 04/24/2011   Difficulty with speech 01/24/2018   Disorder of nervous system    Drug abuse (HCC) 11/21/2017   Dyslipidemia 04/24/2011   E. coli UTI 02/20/2020   Elevated troponin 04/28/2012   Emphysema    Encephalopathy 11/21/2017   Essential hypertension 03/22/2016   Facet arthropathy, lumbar 06/12/2022   Fibula fracture 07/10/2016   Frequent falls 10/11/2017   GERD (gastroesophageal reflux  disease)    GI bleed 03/30/2021   Gout 04/11/2017   Heart attack (HCC) 1980s   Hematochezia    History of blood transfusion 1994   couldn't stop bleeding from my period   History of drug abuse in remission (HCC) 11/28/2015   Quit in 2017   Hyperlipidemia 03/31/2021   Hyperlipidemia LDL goal <70    Hypocalcemia    Hypokalemia    Hypomagnesemia    Incontinence    Manic depression (HCC)    Morbid obesity (HCC) 10/18/2010   Obstructive sleep apnea 10/18/2010   On home oxygen  therapy    6L; 24/7 (04/10/2016)   OSA on CPAP    wear mask sometimes (04/10/2016)   Painless rectal bleeding 12/31/2015   Paranoid (HCC)    sometimes; I'm on RX for it (04/10/2016)   Periumbilical abdominal pain    Prolonged Q-T interval on ECG    Rectal bleeding 12/31/2015   Recurrent syncope 07/09/2021   Rhabdomyolysis 06/16/2021   Schizoaffective disorder, bipolar type (HCC) 04/05/2018   Seasonal allergies    Seborrheic keratoses 12/31/2013   Seizures (HCC)    don't know what kind; last one was ~ 1 yr ago (04/10/2016)   Sinus bradycardia    Skin ulcer of sacrum, limited to breakdown of skin (HCC) 03/08/2022   Spondylolisthesis at L3-L4 level 06/12/2022   Stroke (HCC) 1980s   denies residual on 04/10/2016   Thrush 09/19/2013   Type 2 diabetes mellitus (HCC) 10/18/2010   Current  Facility-Administered Medications  Medication Dose Route Frequency Provider Last Rate Last Admin   albuterol  (VENTOLIN  HFA) 108 (90 Base) MCG/ACT inhaler 2 puff  2 puff Inhalation Q6H PRN Mannie Jerel PARAS, NP   2 puff at 08/18/23 2319   amLODipine  (NORVASC ) tablet 5 mg  5 mg Oral BID Mannie Jerel PARAS, NP   5 mg at 08/18/23 2246   atorvastatin  (LIPITOR ) tablet 20 mg  20 mg Oral Daily Mannie Jerel PARAS, NP   20 mg at 08/18/23 1117   calcitRIOL  (ROCALTROL ) capsule 0.5 mcg  0.5 mcg Oral Daily Jadapalle, Sree, MD   0.5 mcg at 08/18/23 1117   capsaicin  (ZOSTRIX) 0.025 % cream   Topical BID PRN Onuoha, Chinwendu V, NP   Given  at 08/18/23 2318   carvedilol  (COREG ) tablet 12.5 mg  12.5 mg Oral BID Mannie Jerel PARAS, NP   12.5 mg at 08/18/23 2248   Chlorhexidine  Gluconate Cloth 2 % PADS 6 each  6 each Topical Q0600 Druscilla Bald, NP       dicyclomine  (BENTYL ) capsule 10 mg  10 mg Oral TID Mannie Jerel PARAS, NP   10 mg at 08/18/23 2249   docusate sodium  (COLACE) capsule 100 mg  100 mg Oral Daily Shrivastava, Aryendra, MD   100 mg at 08/18/23 1117   escitalopram  (LEXAPRO ) tablet 5 mg  5 mg Oral Daily Mannie Jerel PARAS, NP   5 mg at 08/18/23 1117   ezetimibe  (ZETIA ) tablet 10 mg  10 mg Oral Daily Jadapalle, Sree, MD   10 mg at 08/18/23 1118   ferrous sulfate  tablet 325 mg  325 mg Oral Daily Jadapalle, Sree, MD   325 mg at 08/18/23 1118   folic acid  (FOLVITE ) tablet 1 mg  1 mg Oral Daily Jadapalle, Sree, MD   1 mg at 08/18/23 1117   gabapentin  (NEURONTIN ) capsule 100 mg  100 mg Oral QHS Mannie Jerel PARAS, NP   100 mg at 08/18/23 2247   hydrOXYzine  (ATARAX ) tablet 10 mg  10 mg Oral TID PRN Mannie Jerel PARAS, NP   10 mg at 08/16/23 2153   melatonin tablet 5 mg  5 mg Oral QHS Onuoha, Chinwendu V, NP   5 mg at 08/18/23 2245   metolazone  (ZAROXOLYN ) tablet 2.5 mg  2.5 mg Oral Q M,W,F Jadapalle, Sree, MD   2.5 mg at 08/17/23 9075   mirtazapine  (REMERON ) tablet 15 mg  15 mg Oral QHS Jadapalle, Sree, MD   15 mg at 08/18/23 2245   OLANZapine  (ZYPREXA ) injection 5 mg  5 mg Intramuscular TID PRN Mannie Jerel PARAS, NP       OLANZapine  zydis (ZYPREXA ) disintegrating tablet 5 mg  5 mg Oral TID PRN Mannie Jerel PARAS, NP   5 mg at 08/18/23 2318   paliperidone  (INVEGA ) 24 hr tablet 9 mg  9 mg Oral Daily Shrivastava, Aryendra, MD       pantoprazole  (PROTONIX ) EC tablet 40 mg  40 mg Oral Daily Shrivastava, Aryendra, MD   40 mg at 08/18/23 1116   torsemide  (DEMADEX ) tablet 40 mg  40 mg Oral BID Mannie Jerel PARAS, NP   40 mg at 08/18/23 1701   Allergies  Allergen Reactions   Dorethia Bile ] Other (See Comments)    GI bleed   Deltasone  [Prednisone ]  Anaphylaxis and Swelling   Latuda [Lurasidone Hcl] Anaphylaxis   Lortab [Hydrocodone -Acetaminophen ] Hives and Shortness Of Breath   Magnesium -Containing Compounds Anaphylaxis and Other (See Comments)    Tolerated Ensure   Ultram  [Tramadol ]  Anaphylaxis, Swelling and Rash   Codeine Nausea And Vomiting and Rash   Desyrel  [Trazodone ] Other (See Comments)    Paranoia    Peanut (Diagnostic) Hives and Other (See Comments)    Raw peanuts only  OK to eat peanut butter.   Apresoline  [Hydralazine ] Other (See Comments)    Muscle spasms   Sulfa Antibiotics Itching   Tape Rash   Topamax  [Topiramate ] Other (See Comments)    Paranoia   Principal Problem:   Schizoaffective disorder (HCC)  Blood pressure 135/70, pulse 74, temperature (!) 97.3 F (36.3 C), resp. rate 16, weight 85 kg, SpO2 99%.  Subjective Objective: Vital signs: (most recent): Blood pressure 135/70, pulse 74, temperature (!) 97.3 F (36.3 C), resp. rate 16, weight 85 kg, SpO2 99%.    Assessment & Plan  Tiffany [name] 08/19/2023

## 2023-08-19 NOTE — Progress Notes (Signed)
 [facility] MD Progress Note  08/19/2023 9:00 PM Tiffany [name]  MRN:  [id number]  ill M Tiffany [name] is a 63 y.o. female admitted: Presented to the [facility] 08/13/2023  7:48 PM for after presenting due to a fall later patient exhibit extreme paranoia. She carries the psychiatric diagnoses of PTSD, MDD with and without psychotic features, schizoaffective disorder, and history of polysubstance use and has a past medical history of hypertension, ESRD with HD, congestive heart failure, diabetes, COPD, , stroke. Her current presentation of paranoia, delusional thoughts, auditory and visual hallucinations is most consistent with schizoaffective disorder. She meets criteria for inpatient psychiatric admission based on current symptom Patient is admitted to [facility] unit with Q15 min safety monitoring. Multidisciplinary team approach is offered. Medication management; group/milieu therapy is offered.    Subjective: Patient is a 63 year old female who reportedly was very paranoid and stopped dialysis patient continues to be somewhat paranoid no side effects reported today no aggressive behaviors no PRNs given.   Sleep: Fair  Appetite:  Fair  Past Psychiatric History: see h&P Family History:  Family History  Problem Relation Age of Onset   Cancer Mother        lung   Depression Mother    Cancer Father        prostate   Depression Sister    Anxiety disorder Sister    Schizophrenia Sister    Bipolar disorder Sister    Depression Sister    Depression Brother    Heart failure Other        cousin   Social History:  Social History   Substance and Sexual Activity  Alcohol Use No   Alcohol/week: 0.0 standard drinks of alcohol     Social History   Substance and Sexual Activity  Drug Use Not Currently   Types: Cocaine   Comment: 04/10/2016 last used cocaine back in November 2017    Social History   Socioeconomic History   Marital status: Widowed    Spouse name: Not on file   Number of children: 3    Years of education: 12th   Highest education level: 12th grade  Occupational History   Occupation: disabled    Comment: factory production  Tobacco Use   Smoking status: Former    Current packs/day: 0.00    Average packs/day: 1.5 packs/day for 39.1 years (58.6 ttl pk-yrs)    Types: Cigarettes    Start date: 03/13/1977    Quit date: 04/10/2016    Years since quitting: 7.3    Passive exposure: Past   Smokeless tobacco: Never  Vaping Use   Vaping status: Never Used  Substance and Sexual Activity   Alcohol use: No    Alcohol/week: 0.0 standard drinks of alcohol   Drug use: Not Currently    Types: Cocaine    Comment: 04/10/2016 last used cocaine back in November 2017   Sexual activity: Not Currently    Birth control/protection: Surgical  Other Topics Concern   Not on file  Social History Narrative   Has 1 son, Tiffany [name]   Lives with son and his boyfriend   Her house has ramps and handrails should she ever needs them.    Her mother lives down the street from her and is a good support person in addition to her son.   She drives herself, has private transportation.    Cocaine free since 02/24/16, smoke free since 04/10/16   Dialysis M-W-F   Social Drivers of Health   Financial Resource Strain:  Low Risk  (07/11/2022)   Overall Financial Resource Strain (CARDIA)    Difficulty of Paying Living Expenses: Not very hard  Food Insecurity: Food Insecurity Present (08/15/2023)   Hunger Vital Sign    Worried About Running Out of Food in the Last Year: Sometimes true    Ran Out of Food in the Last Year: Sometimes true  Transportation Needs: No Transportation Needs (08/15/2023)   PRAPARE - Administrator, Civil Service (Medical): No    Lack of Transportation (Non-Medical): No  Physical Activity: Inactive (07/11/2022)   Exercise Vital Sign    Days of Exercise per Week: 0 days    Minutes of Exercise per Session: 0 min  Stress: Stress Concern Present (07/11/2022)   [employer]  of Occupational Health - Occupational Stress Questionnaire    Feeling of Stress : To some extent  Social Connections: Socially Isolated (06/28/2023)   Social Connection and Isolation Panel    Frequency of Communication with Friends and Family: Three times a week    Frequency of Social Gatherings with Friends and Family: Three times a week    Attends Religious Services: Never    Active Member of Clubs or Organizations: No    Attends Banker Meetings: Never    Marital Status: Widowed   Past Medical History:  Past Medical History:  Diagnosis Date   Abdominal pain    Acute encephalopathy 11/21/2017   Acute exacerbation of CHF (congestive heart failure) (HCC) 03/03/2022   Acute GI bleeding 03/29/2021   Acute metabolic encephalopathy 02/20/2020   Aggressive behavior    Agitation 11/22/2017   Anoxic brain injury (HCC) 09/08/2016   C. Arrest due to respiratory failure and COPD exacerbation   Anxiety    Arthritis    all over (04/10/2016)   Asthma 10/18/2010   Binge eating disorder    Blood loss anemia 04/24/2011   CAP (community acquired pneumonia) 06/22/2015   Cardiac arrest (HCC) 09/08/2016   PEA   Carotid artery stenosis    1-39% bilateral by dopplers 11/2016   Chronic diastolic (congestive) heart failure (HCC)    Chronic pain syndrome 06/18/2012   Chronic post-traumatic stress disorder (PTSD) 05/27/2018   Chronic respiratory failure with hypoxia and hypercapnia (HCC) 06/22/2015   TRILOGY Vent >AVAPA-ES., Vt target 200-400, Max P 30 , PS max 20 , PS min 6-10 , E Max 6, E Min 4, Rate Auto AVAPS Rate 2 (titrate for pt comfort) , bleed O2 at 5l/m continuous flow .    Closed displaced fracture of fifth metacarpal bone 03/21/2018   Cocaine use disorder, severe, in sustained remission (HCC) 12/17/2015   Complication of anesthesia    decreased bp, decreased heart rate   COPD (chronic obstructive pulmonary disease) (HCC) 07/08/2014   Delusional disorder, persecutory type  (HCC) 06/26/2021   Depression    Diabetic neuropathy (HCC) 04/24/2011   Difficulty with speech 01/24/2018   Disorder of nervous system    Drug abuse (HCC) 11/21/2017   Dyslipidemia 04/24/2011   E. coli UTI 02/20/2020   Elevated troponin 04/28/2012   Emphysema    Encephalopathy 11/21/2017   Essential hypertension 03/22/2016   Facet arthropathy, lumbar 06/12/2022   Fibula fracture 07/10/2016   Frequent falls 10/11/2017   GERD (gastroesophageal reflux disease)    GI bleed 03/30/2021   Gout 04/11/2017   Heart attack (HCC) 1980s   Hematochezia    History of blood transfusion 1994   couldn't stop bleeding from my period   History of  drug abuse in remission (HCC) 11/28/2015   Quit in 2017   Hyperlipidemia 03/31/2021   Hyperlipidemia LDL goal <70    Hypocalcemia    Hypokalemia    Hypomagnesemia    Incontinence    Manic depression (HCC)    Morbid obesity (HCC) 10/18/2010   Obstructive sleep apnea 10/18/2010   On home oxygen  therapy    6L; 24/7 (04/10/2016)   OSA on CPAP    wear mask sometimes (04/10/2016)   Painless rectal bleeding 12/31/2015   Paranoid (HCC)    sometimes; I'm on RX for it (04/10/2016)   Periumbilical abdominal pain    Prolonged Q-T interval on ECG    Rectal bleeding 12/31/2015   Recurrent syncope 07/09/2021   Rhabdomyolysis 06/16/2021   Schizoaffective disorder, bipolar type (HCC) 04/05/2018   Seasonal allergies    Seborrheic keratoses 12/31/2013   Seizures (HCC)    don't know what kind; last one was ~ 1 yr ago (04/10/2016)   Sinus bradycardia    Skin ulcer of sacrum, limited to breakdown of skin (HCC) 03/08/2022   Spondylolisthesis at L3-L4 level 06/12/2022   Stroke (HCC) 1980s   denies residual on 04/10/2016   Thrush 09/19/2013   Type 2 diabetes mellitus (HCC) 10/18/2010    Past Surgical History:  Procedure Laterality Date   A/V FISTULAGRAM N/A 02/01/2023   Procedure: A/V Fistulagram;  Surgeon: Norine Manuelita LABOR, MD;  Location: [facility] INVASIVE  CV LAB;  Service: Cardiovascular;  Laterality: N/A;   AV FISTULA PLACEMENT Left 12/21/2022   Procedure: INSERTION OF LEFT ARM ARTERIOVENOUS (AV) GORE-TEX GRAFT;  Surgeon: Magda Debby SAILOR, MD;  Location: [facility] OR;  Service: Vascular;  Laterality: Left;   CESAREAN SECTION  1997   COLONOSCOPY WITH PROPOFOL  N/A 04/01/2021   Procedure: COLONOSCOPY WITH PROPOFOL ;  Surgeon: Rollin Dover, MD;  Location: [name];  Service: Gastroenterology;  Laterality: N/A;  Rectal bleeding with drop in hemoglobin to 7.2 g/dL   HERNIA REPAIR     IR CHOLANGIOGRAM EXISTING TUBE  07/20/2016   IR PERC CHOLECYSTOSTOMY  05/10/2016   IR RADIOLOGIST EVAL & MGMT  06/08/2016   IR RADIOLOGIST EVAL & MGMT  06/29/2016   IR SINUS/FIST TUBE CHK-NON GI  07/12/2016   RIGHT/LEFT HEART CATH AND CORONARY ANGIOGRAPHY N/A 06/19/2017   Procedure: RIGHT/LEFT HEART CATH AND CORONARY ANGIOGRAPHY;  Surgeon: Cherrie Toribio SAUNDERS, MD;  Location: [facility] INVASIVE CV LAB;  Service: Cardiovascular;  Laterality: N/A;   TIBIA IM NAIL INSERTION Right 07/12/2016   Procedure: INTRAMEDULLARY (IM) NAIL RIGHT TIBIA;  Surgeon: Jerri Kay HERO, MD;  Location: [facility] OR;  Service: Orthopedics;  Laterality: Right;   UMBILICAL HERNIA REPAIR  ~ 1963   that's why I don't have a belly button   VAGINAL HYSTERECTOMY      Current Medications: Current Facility-Administered Medications  Medication Dose Route Frequency Provider Last Rate Last Admin   albuterol  (VENTOLIN  HFA) 108 (90 Base) MCG/ACT inhaler 2 puff  2 puff Inhalation Q6H PRN Mannie Jerel PARAS, NP   2 puff at 08/18/23 2319   amLODipine  (NORVASC ) tablet 5 mg  5 mg Oral BID Mannie Jerel PARAS, NP   5 mg at 08/19/23 9075   atorvastatin  (LIPITOR ) tablet 20 mg  20 mg Oral Daily Mannie Jerel PARAS, NP   20 mg at 08/19/23 9077   calcitRIOL  (ROCALTROL ) capsule 0.5 mcg  0.5 mcg Oral Daily Jadapalle, Sree, MD   0.5 mcg at 08/19/23 0928   capsaicin  (ZOSTRIX) 0.025 % cream   Topical BID PRN Onuoha,  Chinwendu V, NP   Given at  08/18/23 2318   carvedilol  (COREG ) tablet 12.5 mg  12.5 mg Oral BID Mannie Jerel PARAS, NP   12.5 mg at 08/19/23 2059   Chlorhexidine  Gluconate Cloth 2 % PADS 6 each  6 each Topical Q0600 Druscilla Bald, NP       dicyclomine  (BENTYL ) capsule 10 mg  10 mg Oral TID Mannie Jerel PARAS, NP   10 mg at 08/19/23 2059   docusate sodium  (COLACE) capsule 100 mg  100 mg Oral Daily Shrivastava, Aryendra, MD   100 mg at 08/19/23 9077   escitalopram  (LEXAPRO ) tablet 5 mg  5 mg Oral Daily Mannie Jerel PARAS, NP   5 mg at 08/19/23 9076   ezetimibe  (ZETIA ) tablet 10 mg  10 mg Oral Daily Jadapalle, Sree, MD   10 mg at 08/19/23 9071   ferrous sulfate  tablet 325 mg  325 mg Oral Daily Jadapalle, Sree, MD   325 mg at 08/19/23 9074   folic acid  (FOLVITE ) tablet 1 mg  1 mg Oral Daily Jadapalle, Sree, MD   1 mg at 08/19/23 9074   gabapentin  (NEURONTIN ) capsule 100 mg  100 mg Oral QHS Mannie Jerel PARAS, NP   100 mg at 08/19/23 2059   hydrOXYzine  (ATARAX ) tablet 10 mg  10 mg Oral TID PRN Mannie Jerel PARAS, NP   10 mg at 08/16/23 2153   melatonin tablet 5 mg  5 mg Oral QHS Onuoha, Chinwendu V, NP   5 mg at 08/19/23 2058   metolazone  (ZAROXOLYN ) tablet 2.5 mg  2.5 mg Oral Q M,W,F Jadapalle, Sree, MD   2.5 mg at 08/17/23 9075   mirtazapine  (REMERON ) tablet 15 mg  15 mg Oral QHS Jadapalle, Sree, MD   15 mg at 08/19/23 2059   OLANZapine  (ZYPREXA ) injection 5 mg  5 mg Intramuscular TID PRN Mannie Jerel PARAS, NP       OLANZapine  zydis (ZYPREXA ) disintegrating tablet 5 mg  5 mg Oral TID PRN Mannie Jerel PARAS, NP   5 mg at 08/18/23 2318   paliperidone  (INVEGA ) 24 hr tablet 9 mg  9 mg Oral Daily Shrivastava, Aryendra, MD   9 mg at 08/19/23 9077   pantoprazole  (PROTONIX ) EC tablet 40 mg  40 mg Oral Daily Shrivastava, Aryendra, MD   40 mg at 08/19/23 0930   torsemide  (DEMADEX ) tablet 40 mg  40 mg Oral BID Mannie Jerel PARAS, NP   40 mg at 08/19/23 1745    Lab Results:  No results found. However, due to the size of the patient record, not all  encounters were searched. Please check Results Review for a complete set of results.   Blood Alcohol level:  Lab Results  Component Value Date   [name] <15 05/31/2023   ETH <10 03/10/2023    Metabolic Disorder Labs: Lab Results  Component Value Date   HGBA1C 5.0 01/05/2022   MPG 99.67 06/17/2021   MPG 105.41 07/23/2020   No results found for: PROLACTIN Lab Results  Component Value Date   CHOL 151 11/14/2022   TRIG 100 11/14/2022   HDL 65 11/14/2022   CHOLHDL 2.3 11/14/2022   VLDL 20 11/14/2022   LDLCALC 66 11/14/2022   LDLCALC 54 08/05/2018    Physical Findings: AIMS:  , ,  ,  ,    CIWA:    COWS:      Psychiatric Specialty Exam:  Presentation  General Appearance:  Appropriate for Environment; Casual  Eye Contact: Fair  Speech: Clear and  Coherent  Speech Volume: Normal    Mood and Affect  Mood: Anxious; Irritable  Affect: Labile   Thought Process  Thought Processes: Disorganized  Descriptions of Associations:Intact  Orientation:Partial  Thought Content:Paranoid Ideation  Hallucinations: Auditory hallucinations  Ideas of Reference:Paranoia; Delusions  Suicidal Thoughts: Denies  Homicidal Thoughts: Denies   Sensorium  Memory: Immediate Fair; Recent Fair; Remote Poor  Judgment: Impaired  Insight: Shallow        Physical Exam: Physical Exam ROS Blood pressure 134/78, pulse 91, temperature 98.1 F (36.7 C), resp. rate 18, weight 85 kg, SpO2 100%. Body mass index is 34.27 kg/m.  Diagnosis: Principal Problem:   Schizoaffective disorder [name])   Clinical Decision Making: Patient with history of Schizoaffective disorder Bipolar type, end-stage renal disease admitted for worsening paranoia and delusions about people abusing her, out to get her.  Her paranoia worsened to the point where she refused to leave the house and skipped her dialysis for 5 days which led up to medical emergency.  Patient needs inpatient psychiatric  hospitalization for further stabilization.   Treatment Plan Summary:   Safety and Monitoring:             -- Voluntary admission to inpatient psychiatric unit for safety, stabilization and treatment             -- Daily contact with patient to assess and evaluate symptoms and progress in treatment             -- Patient's case to be discussed in multi-disciplinary team meeting             -- Observation Level: q15 minute checks             -- Vital signs:  q12 hours             -- Precautions: suicide, elopement, and assault   2. Psychiatric Diagnoses and Treatment:               We will discontinue Zyprexa .   Will increase the Invega  to 6 mg today.  We will give additional 3 mg dose today. will increase to 9 mg tomorrow.   Patient has chronic delusions but exacerbated to the extent of not going to dialysis-   Will consider adding saphris  which is her home meds   -- The risks/benefits/side-effects/alternatives to this medication were discussed in detail with the patient and time was given for questions. The patient consents to medication trial.                -- Metabolic profile and EKG monitoring obtained while on an atypical antipsychotic (BMI: Lipid Panel: HbgA1c: QTc:)              -- Encouraged patient to participate in unit milieu and in scheduled group therapies                            3. Medical Issues Being Addressed:   ESRD: consulted nephrology for dialysis 4. Discharge Planning:   -- Social work and case management to assist with discharge planning and identification of [name] follow-up needs prior to discharge  -- Estimated LOS: 3-4 days  Tiffany JONELLE Manners, MD 08/19/2023, 9:00 PM

## 2023-08-19 NOTE — Group Note (Signed)
 Date:  08/19/2023 Time:  10:36 AM  Group Topic/Focus:  Overcoming Stress:   The focus of this group is to define stress and help patients assess their triggers.    Participation Level:  Did Not Attend   Arland Nutting 08/19/2023, 10:36 AM

## 2023-08-20 DIAGNOSIS — F259 Schizoaffective disorder, unspecified: Secondary | ICD-10-CM | POA: Diagnosis not present

## 2023-08-20 NOTE — Group Note (Signed)
 Date:  08/20/2023 Time:  2:23 PM  Group Topic/Focus:  Making Healthy Choices:   The focus of this group is to help patients identify negative/unhealthy choices they were using prior to admission and identify positive/healthier coping strategies to replace them upon discharge.    Participation Level:  Active  Participation Quality:    Affect:    Cognitive:  Appropriate  Insight: Appropriate  Engagement in Group:  Engaged  Modes of Intervention:  Activity  Additional Comments:    Dannis Deroche 08/20/2023, 2:23 PM

## 2023-08-20 NOTE — Group Note (Signed)
 Recreation Therapy Group Note   Group Topic:Leisure Education  Group Date: 08/20/2023 Start Time: 1500 End Time: 1600 Facilitators: Celestia Jeoffrey BRAVO, LRT, CTRS Location: Courtyard  Group Description: Music. Patients encouraged to name their favorite song(s) for LRT to play song through speaker for group to hear, while in the courtyard getting fresh air and sunlight. Patients educated on the definition of leisure and the importance of having different leisure interests outside of the hospital. Group discussed how leisure activities can often be used as Pharmacologist and that listening to music and being outside are examples.    Goal Area(s) Addressed:  Patient will identify a current leisure interest.  Patient will practice making a positive decision. Patient will have the opportunity to try a new leisure activity.   Affect/Mood: N/A   Participation Level: Did not attend    Clinical Observations/Individualized Feedback: Patient did not attend group.   Plan: Continue to engage patient in RT group sessions 2-3x/week.   Jeoffrey BRAVO Celestia, LRT, CTRS 08/20/2023 5:13 PM

## 2023-08-20 NOTE — Progress Notes (Signed)
 [facility] MD Progress Note  08/20/2023 3:39 PM Tiffany [name]  MRN:  [id number]  ill M Tiffany [name] is a 63 y.o. female admitted: Presented to the [facility] 08/13/2023  7:48 PM for after presenting due to a fall later patient exhibit extreme paranoia. She carries the psychiatric diagnoses of PTSD, MDD with and without psychotic features, schizoaffective disorder, and history of polysubstance use and has a past medical history of hypertension, ESRD with HD, congestive heart failure, diabetes, COPD, , stroke. Her current presentation of paranoia, delusional thoughts, auditory and visual hallucinations is most consistent with schizoaffective disorder. She meets criteria for inpatient psychiatric admission based on current symptom Patient is admitted to [facility] unit with Q15 min safety monitoring. Multidisciplinary team approach is offered. Medication management; group/milieu therapy is offered.    Subjective: Chart is reviewed and discussed the case with treatment team.  Patient is noted to be talking to another peer.  Patient remains hyperfocused and paranoid about the staff giving her different medications and the weekend provider changing her medications in spite of multiple discussions with her about discontinuing Zyprexa  and initiating Invega  with the intent of putting her on LAI given her chronic paranoia/delusions.  After explaining the treatment plan again with Invega  and the LAI Invega  Sustenna patient expressed her understanding of the current treatment plan.  She continues to remain focused on staff and other patients trying to harm her by giving her different medications.  She denies SI/HI/plan.  [name] participating in the groups.  She reports that she got dialysis on Saturday and nobody came to check on her after that.  Per nursing staff patient has dialysis on Tuesday.  Sleep: Fair  Appetite:  Fair  Past Psychiatric History: see h&P Family History:  Family History  Problem Relation Age of Onset    Cancer Mother        lung   Depression Mother    Cancer Father        prostate   Depression Sister    Anxiety disorder Sister    Schizophrenia Sister    Bipolar disorder Sister    Depression Sister    Depression Brother    Heart failure Other        cousin   Social History:  Social History   Substance and Sexual Activity  Alcohol Use No   Alcohol/week: 0.0 standard drinks of alcohol     Social History   Substance and Sexual Activity  Drug Use Not Currently   Types: Cocaine   Comment: 04/10/2016 last used cocaine back in November 2017    Social History   Socioeconomic History   Marital status: Widowed    Spouse name: Not on file   Number of children: 3   Years of education: 12th   Highest education level: 12th grade  Occupational History   Occupation: disabled    Comment: factory production  Tobacco Use   Smoking status: Former    Current packs/day: 0.00    Average packs/day: 1.5 packs/day for 39.1 years (58.6 ttl pk-yrs)    Types: Cigarettes    Start date: 03/13/1977    Quit date: 04/10/2016    Years since quitting: 7.3    Passive exposure: Past   Smokeless tobacco: Never  Vaping Use   Vaping status: Never Used  Substance and Sexual Activity   Alcohol use: No    Alcohol/week: 0.0 standard drinks of alcohol   Drug use: Not Currently    Types: Cocaine    Comment: 04/10/2016  last used cocaine back in November 2017   Sexual activity: Not Currently    Birth control/protection: Surgical  Other Topics Concern   Not on file  Social History Narrative   Has 1 son, Tiffany [name]   Lives with son and his boyfriend   Her house has ramps and handrails should she ever needs them.    Her mother lives down the street from her and is a good support person in addition to her son.   She drives herself, has private transportation.    Cocaine free since 02/24/16, smoke free since 04/10/16   Dialysis M-W-F   Social Drivers of Health   Financial Resource Strain: Low Risk   (07/11/2022)   Overall Financial Resource Strain (CARDIA)    Difficulty of Paying Living Expenses: Not very hard  Food Insecurity: Food Insecurity Present (08/15/2023)   Hunger Vital Sign    Worried About Running Out of Food in the Last Year: Sometimes true    Ran Out of Food in the Last Year: Sometimes true  Transportation Needs: No Transportation Needs (08/15/2023)   PRAPARE - Administrator, Civil Service (Medical): No    Lack of Transportation (Non-Medical): No  Physical Activity: Inactive (07/11/2022)   Exercise Vital Sign    Days of Exercise per Week: 0 days    Minutes of Exercise per Session: 0 min  Stress: Stress Concern Present (07/11/2022)   [employer] of Occupational Health - Occupational Stress Questionnaire    Feeling of Stress : To some extent  Social Connections: Socially Isolated (06/28/2023)   Social Connection and Isolation Panel    Frequency of Communication with Friends and Family: Three times a week    Frequency of Social Gatherings with Friends and Family: Three times a week    Attends Religious Services: Never    Active Member of Clubs or Organizations: No    Attends Banker Meetings: Never    Marital Status: Widowed   Past Medical History:  Past Medical History:  Diagnosis Date   Abdominal pain    Acute encephalopathy 11/21/2017   Acute exacerbation of CHF (congestive heart failure) (HCC) 03/03/2022   Acute GI bleeding 03/29/2021   Acute metabolic encephalopathy 02/20/2020   Aggressive behavior    Agitation 11/22/2017   Anoxic brain injury (HCC) 09/08/2016   C. Arrest due to respiratory failure and COPD exacerbation   Anxiety    Arthritis    all over (04/10/2016)   Asthma 10/18/2010   Binge eating disorder    Blood loss anemia 04/24/2011   CAP (community acquired pneumonia) 06/22/2015   Cardiac arrest (HCC) 09/08/2016   PEA   Carotid artery stenosis    1-39% bilateral by dopplers 11/2016   Chronic diastolic  (congestive) heart failure (HCC)    Chronic pain syndrome 06/18/2012   Chronic post-traumatic stress disorder (PTSD) 05/27/2018   Chronic respiratory failure with hypoxia and hypercapnia (HCC) 06/22/2015   TRILOGY Vent >AVAPA-ES., Vt target 200-400, Max P 30 , PS max 20 , PS min 6-10 , E Max 6, E Min 4, Rate Auto AVAPS Rate 2 (titrate for pt comfort) , bleed O2 at 5l/m continuous flow .    Closed displaced fracture of fifth metacarpal bone 03/21/2018   Cocaine use disorder, severe, in sustained remission (HCC) 12/17/2015   Complication of anesthesia    decreased bp, decreased heart rate   COPD (chronic obstructive pulmonary disease) (HCC) 07/08/2014   Delusional disorder, persecutory type (HCC) 06/26/2021   Depression  Diabetic neuropathy (HCC) 04/24/2011   Difficulty with speech 01/24/2018   Disorder of nervous system    Drug abuse (HCC) 11/21/2017   Dyslipidemia 04/24/2011   E. coli UTI 02/20/2020   Elevated troponin 04/28/2012   Emphysema    Encephalopathy 11/21/2017   Essential hypertension 03/22/2016   Facet arthropathy, lumbar 06/12/2022   Fibula fracture 07/10/2016   Frequent falls 10/11/2017   GERD (gastroesophageal reflux disease)    GI bleed 03/30/2021   Gout 04/11/2017   Heart attack (HCC) 1980s   Hematochezia    History of blood transfusion 1994   couldn't stop bleeding from my period   History of drug abuse in remission (HCC) 11/28/2015   Quit in 2017   Hyperlipidemia 03/31/2021   Hyperlipidemia LDL goal <70    Hypocalcemia    Hypokalemia    Hypomagnesemia    Incontinence    Manic depression (HCC)    Morbid obesity (HCC) 10/18/2010   Obstructive sleep apnea 10/18/2010   On home oxygen  therapy    6L; 24/7 (04/10/2016)   OSA on CPAP    wear mask sometimes (04/10/2016)   Painless rectal bleeding 12/31/2015   Paranoid (HCC)    sometimes; I'm on RX for it (04/10/2016)   Periumbilical abdominal pain    Prolonged Q-T interval on ECG    Rectal bleeding  12/31/2015   Recurrent syncope 07/09/2021   Rhabdomyolysis 06/16/2021   Schizoaffective disorder, bipolar type (HCC) 04/05/2018   Seasonal allergies    Seborrheic keratoses 12/31/2013   Seizures (HCC)    don't know what kind; last one was ~ 1 yr ago (04/10/2016)   Sinus bradycardia    Skin ulcer of sacrum, limited to breakdown of skin (HCC) 03/08/2022   Spondylolisthesis at L3-L4 level 06/12/2022   Stroke (HCC) 1980s   denies residual on 04/10/2016   Thrush 09/19/2013   Type 2 diabetes mellitus (HCC) 10/18/2010    Past Surgical History:  Procedure Laterality Date   A/V FISTULAGRAM N/A 02/01/2023   Procedure: A/V Fistulagram;  Surgeon: Norine Manuelita LABOR, MD;  Location: [facility] INVASIVE CV LAB;  Service: Cardiovascular;  Laterality: N/A;   AV FISTULA PLACEMENT Left 12/21/2022   Procedure: INSERTION OF LEFT ARM ARTERIOVENOUS (AV) GORE-TEX GRAFT;  Surgeon: Magda Debby SAILOR, MD;  Location: [facility] OR;  Service: Vascular;  Laterality: Left;   CESAREAN SECTION  1997   COLONOSCOPY WITH PROPOFOL  N/A 04/01/2021   Procedure: COLONOSCOPY WITH PROPOFOL ;  Surgeon: Rollin Dover, MD;  Location: [facility] ENDOSCOPY;  Service: Gastroenterology;  Laterality: N/A;  Rectal bleeding with drop in hemoglobin to 7.2 g/dL   HERNIA REPAIR     IR CHOLANGIOGRAM EXISTING TUBE  07/20/2016   IR PERC CHOLECYSTOSTOMY  05/10/2016   IR RADIOLOGIST EVAL & MGMT  06/08/2016   IR RADIOLOGIST EVAL & MGMT  06/29/2016   IR SINUS/FIST TUBE CHK-NON GI  07/12/2016   RIGHT/LEFT HEART CATH AND CORONARY ANGIOGRAPHY N/A 06/19/2017   Procedure: RIGHT/LEFT HEART CATH AND CORONARY ANGIOGRAPHY;  Surgeon: Cherrie Toribio SAUNDERS, MD;  Location: [facility] INVASIVE CV LAB;  Service: Cardiovascular;  Laterality: N/A;   TIBIA IM NAIL INSERTION Right 07/12/2016   Procedure: INTRAMEDULLARY (IM) NAIL RIGHT TIBIA;  Surgeon: Jerri Kay HERO, MD;  Location: [facility] OR;  Service: Orthopedics;  Laterality: Right;   UMBILICAL HERNIA REPAIR  ~ 1963   that's why I don't have a belly  button   VAGINAL HYSTERECTOMY      Current Medications: Current Facility-Administered Medications  Medication Dose Route Frequency Provider Last  Rate Last Admin   albuterol  (VENTOLIN  HFA) 108 (90 Base) MCG/ACT inhaler 2 puff  2 puff Inhalation Q6H PRN Mannie Jerel PARAS, NP   2 puff at 08/20/23 1315   amLODipine  (NORVASC ) tablet 5 mg  5 mg Oral BID Mannie Jerel PARAS, NP   5 mg at 08/20/23 1029   atorvastatin  (LIPITOR ) tablet 20 mg  20 mg Oral Daily Mannie Jerel PARAS, NP   20 mg at 08/20/23 1029   calcitRIOL  (ROCALTROL ) capsule 0.5 mcg  0.5 mcg Oral Daily Billie Trager, MD   0.5 mcg at 08/20/23 1052   capsaicin  (ZOSTRIX) 0.025 % cream   Topical BID PRN Onuoha, Chinwendu V, NP   Given at 08/20/23 1314   carvedilol  (COREG ) tablet 12.5 mg  12.5 mg Oral BID Mannie Jerel PARAS, NP   12.5 mg at 08/20/23 1052   Chlorhexidine  Gluconate Cloth 2 % PADS 6 each  6 each Topical Q0600 Druscilla Bald, NP       dicyclomine  (BENTYL ) capsule 10 mg  10 mg Oral TID Mannie Jerel PARAS, NP   10 mg at 08/20/23 1054   docusate sodium  (COLACE) capsule 100 mg  100 mg Oral Daily Shrivastava, Aryendra, MD   100 mg at 08/20/23 1053   escitalopram  (LEXAPRO ) tablet 5 mg  5 mg Oral Daily Mannie Jerel PARAS, NP   5 mg at 08/20/23 1053   ezetimibe  (ZETIA ) tablet 10 mg  10 mg Oral Daily Saray Capasso, MD   10 mg at 08/20/23 1054   ferrous sulfate  tablet 325 mg  325 mg Oral Daily Abbie Berling, MD   325 mg at 08/20/23 1055   folic acid  (FOLVITE ) tablet 1 mg  1 mg Oral Daily Josseline Reddin, MD   1 mg at 08/20/23 1055   gabapentin  (NEURONTIN ) capsule 100 mg  100 mg Oral QHS Mannie Jerel PARAS, NP   100 mg at 08/19/23 2059   hydrOXYzine  (ATARAX ) tablet 10 mg  10 mg Oral TID PRN Mannie Jerel PARAS, NP   10 mg at 08/16/23 2153   melatonin tablet 5 mg  5 mg Oral QHS Onuoha, Chinwendu V, NP   5 mg at 08/19/23 2058   metolazone  (ZAROXOLYN ) tablet 2.5 mg  2.5 mg Oral Q M,W,F Mohammad Granade, MD   2.5 mg at 08/20/23 1055   mirtazapine   (REMERON ) tablet 15 mg  15 mg Oral QHS Talin Rozeboom, MD   15 mg at 08/19/23 2059   OLANZapine  (ZYPREXA ) injection 5 mg  5 mg Intramuscular TID PRN Mannie Jerel PARAS, NP       OLANZapine  zydis (ZYPREXA ) disintegrating tablet 5 mg  5 mg Oral TID PRN Mannie Jerel PARAS, NP   5 mg at 08/18/23 2318   paliperidone  (INVEGA ) 24 hr tablet 9 mg  9 mg Oral Daily Shrivastava, Aryendra, MD   9 mg at 08/20/23 1056   pantoprazole  (PROTONIX ) EC tablet 40 mg  40 mg Oral Daily Shrivastava, Aryendra, MD   40 mg at 08/20/23 1056   torsemide  (DEMADEX ) tablet 40 mg  40 mg Oral BID Mannie Jerel PARAS, NP   40 mg at 08/20/23 1056    Lab Results:  No results found. However, due to the size of the patient record, not all encounters were searched. Please check Results Review for a complete set of results.   Blood Alcohol level:  Lab Results  Component Value Date   [facility] <15 05/31/2023   ETH <10 03/10/2023    Metabolic Disorder Labs: Lab Results  Component  Value Date   HGBA1C 5.0 01/05/2022   MPG 99.67 06/17/2021   MPG 105.41 07/23/2020   No results found for: PROLACTIN Lab Results  Component Value Date   CHOL 151 11/14/2022   TRIG 100 11/14/2022   HDL 65 11/14/2022   CHOLHDL 2.3 11/14/2022   VLDL 20 11/14/2022   LDLCALC 66 11/14/2022   LDLCALC 54 08/05/2018    Physical Findings: AIMS:  , ,  ,  ,    CIWA:    COWS:      Psychiatric Specialty Exam:  Presentation  General Appearance:  Appropriate for Environment; Casual  Eye Contact: Fair  Speech: Clear and Coherent  Speech Volume: Normal    Mood and Affect  Mood: Anxious; Irritable  Affect: Labile   Thought Process  Thought Processes: Disorganized  Descriptions of Associations:Intact  Orientation:Partial  Thought Content:Paranoid Ideation  Hallucinations: Auditory hallucinations  Ideas of Reference:Paranoia; Delusions  Suicidal Thoughts: Denies  Homicidal Thoughts: Denies   Sensorium  Memory: Immediate Fair;  Recent Fair; Remote Poor  Judgment: Impaired  Insight: Shallow        Physical Exam: Physical Exam ROS Blood pressure 134/78, pulse 91, temperature 98.1 F (36.7 C), resp. rate 18, weight 85 kg, SpO2 100%. Body mass index is 34.27 kg/m.  Diagnosis: Principal Problem:   Schizoaffective disorder [facility])   Clinical Decision Making: Patient with history of Schizoaffective disorder Bipolar type, end-stage renal disease admitted for worsening paranoia and delusions about people abusing her, out to get her.  Her paranoia worsened to the point where she refused to leave the house and skipped her dialysis for 5 days which led up to medical emergency.  Patient needs inpatient psychiatric hospitalization for further stabilization.   Treatment Plan Summary:   Safety and Monitoring:             -- Voluntary admission to inpatient psychiatric unit for safety, stabilization and treatment             -- Daily contact with patient to assess and evaluate symptoms and progress in treatment             -- Patient's case to be discussed in multi-disciplinary team meeting             -- Observation Level: q15 minute checks             -- Vital signs:  q12 hours             -- Precautions: suicide, elopement, and assault   2. Psychiatric Diagnoses and Treatment:               We will discontinue Zyprexa .   Will increase the Invega  to 6 mg today.  We will give additional 3 mg dose today. will increase to 9 mg tomorrow.   Patient has chronic delusions but exacerbated to the extent of not going to dialysis-   Will consider adding saphris  which is her home meds   -- The risks/benefits/side-effects/alternatives to this medication were discussed in detail with the patient and time was given for questions. The patient consents to medication trial.                -- Metabolic profile and EKG monitoring obtained while on an atypical antipsychotic (BMI: Lipid Panel: HbgA1c: QTc:)              --  Encouraged patient to participate in unit milieu and in scheduled group therapies  3. Medical Issues Being Addressed:   ESRD: consulted nephrology for dialysis 4. Discharge Planning:   -- Social work and case management to assist with discharge planning and identification of hospital follow-up needs prior to discharge  -- Estimated LOS: 3-4 days  Cavon Nicolls, MD 08/20/2023, 3:39 PM

## 2023-08-20 NOTE — Plan of Care (Signed)
  Problem: Activity: Goal: Will verbalize the importance of balancing activity with adequate rest periods Outcome: Progressing   Problem: Education: Goal: Knowledge of the prescribed therapeutic regimen will improve Outcome: Progressing   Problem: Coping: Goal: Will verbalize feelings Outcome: Progressing   Problem: Role Relationship: Goal: Ability to communicate needs accurately will improve Outcome: Progressing   Problem: Safety: Goal: Ability to redirect hostility and anger into socially appropriate behaviors will improve Outcome: Progressing   Problem: Education: Goal: Utilization of techniques to improve thought processes will improve Outcome: Progressing

## 2023-08-20 NOTE — Progress Notes (Signed)
   08/20/23 1200  Psych Admission Type (Psych Patients Only)  Admission Status Voluntary  Psychosocial Assessment  Patient Complaints None  Eye Contact Fair  Facial Expression Anxious  Affect Depressed  Speech Logical/coherent  Interaction Assertive  Motor Activity Slow  Appearance/Hygiene In scrubs  Behavior Characteristics Cooperative  Mood Pleasant  Thought Process  Coherency WDL  Content WDL  Delusions None reported or observed  Perception WDL  Hallucination None reported or observed  Judgment Impaired  Confusion Mild  Danger to Self  Current suicidal ideation? Denies  Danger to Others  Danger to Others None reported or observed

## 2023-08-20 NOTE — Progress Notes (Addendum)
 Assumed care of patient last evening, pt pleasant and cooperative initially, shortly, thereafter she was irritable and refused her medications and was tearful, taking about her life being unmanageable do to lifestyle. After supporting and providing reassurance pt decided to take her medicine and retired for the night.     08/19/23 2030  Psych Admission Type (Psych Patients Only)  Admission Status Voluntary  Psychosocial Assessment  Patient Complaints Depression;Crying spells  Eye Contact Fair  Facial Expression Anxious  Affect Depressed  Speech Logical/coherent  Interaction Assertive  Motor Activity Slow  Appearance/Hygiene In scrubs  Behavior Characteristics Cooperative  Mood Depressed;Sad  Thought Process  Coherency WDL  Content WDL  Delusions None reported or observed  Perception WDL  Hallucination None reported or observed  Judgment Impaired  Confusion Mild  Danger to Self  Current suicidal ideation? Denies

## 2023-08-20 NOTE — [facility] Counselor (Signed)
 [name] contacted [facility] [phone])  [name] unable to reach, left HIPAA compliant VM requesting return call.   Lum Croft, MSW, [place] 08/20/2023 3:30 PM

## 2023-08-20 NOTE — Group Note (Signed)
 Date:  08/20/2023 Time:  10:41 PM  Group Topic/Focus:  Self Care:   The focus of this group is to help patients understand the importance of self-care in order to improve or restore emotional, physical, spiritual, interpersonal, and financial health.    Participation Level:  Active  Participation Quality:  Appropriate  Affect:  Appropriate  Cognitive:  Appropriate  Insight: Appropriate  Engagement in Group:  Engaged  Modes of Intervention:  Education  Additional Comments:    Tiffany [name] 08/20/2023, 10:41 PM

## 2023-08-21 DIAGNOSIS — F259 Schizoaffective disorder, unspecified: Secondary | ICD-10-CM | POA: Diagnosis not present

## 2023-08-21 LAB — RENAL FUNCTION PANEL
Albumin: 2.9 g/dL — ABNORMAL LOW (ref 3.5–5.0)
Anion gap: 16 — ABNORMAL HIGH (ref 5–15)
BUN: 70 mg/dL — ABNORMAL HIGH (ref 8–23)
CO2: 26 mmol/L (ref 22–32)
Calcium: 9.7 mg/dL (ref 8.9–10.3)
Chloride: 88 mmol/L — ABNORMAL LOW (ref 98–111)
Creatinine, Ser: 4.9 mg/dL — ABNORMAL HIGH (ref 0.44–1.00)
GFR, Estimated: 9 mL/min — ABNORMAL LOW (ref >60)
Glucose, Bld: 91 mg/dL (ref 70–99)
Phosphorus: 4.5 mg/dL (ref 2.5–4.6)
Potassium: 3 mmol/L — ABNORMAL LOW (ref 3.5–5.1)
Sodium: 130 mmol/L — ABNORMAL LOW (ref 135–145)

## 2023-08-21 LAB — CBC
HCT: 26.4 % — ABNORMAL LOW (ref 36.0–46.0)
Hemoglobin: 8.3 g/dL — ABNORMAL LOW (ref 12.0–15.0)
MCH: 28.4 pg (ref 26.0–34.0)
MCHC: 31.4 g/dL (ref 30.0–36.0)
MCV: 90.4 fL (ref 80.0–100.0)
Platelets: 337 K/uL (ref 150–400)
RBC: 2.92 MIL/uL — ABNORMAL LOW (ref 3.87–5.11)
RDW: 16 % — ABNORMAL HIGH (ref 11.5–15.5)
WBC: 7.9 K/uL (ref 4.0–10.5)
nRBC: 0 % (ref 0.0–0.2)

## 2023-08-21 MED ORDER — EPOETIN ALFA-EPBX 10000 UNIT/ML IJ SOLN
10000.0000 [IU] | INTRAMUSCULAR | Status: DC
  Administered 2023-08-21 – 2023-09-06 (×4): 10000 [IU] via INTRAVENOUS
  Filled 2023-08-21 (×8): qty 2

## 2023-08-21 MED ORDER — HEPARIN SODIUM (PORCINE) 1000 UNIT/ML IJ SOLN
INTRAMUSCULAR | Status: AC
  Filled 2023-08-21: qty 5

## 2023-08-21 MED ORDER — EPOETIN ALFA-EPBX 10000 UNIT/ML IJ SOLN
INTRAMUSCULAR | Status: AC
  Filled 2023-08-21: qty 1

## 2023-08-21 MED ORDER — PALIPERIDONE ER 3 MG PO TB24
9.0000 mg | ORAL_TABLET | Freq: Every day | ORAL | Status: DC
  Administered 2023-08-22 – 2023-08-26 (×5): 9 mg via ORAL
  Filled 2023-08-21 (×6): qty 3
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
## 2023-08-21 NOTE — BHH Counselor (Signed)
 [name] contacted APS to submit report (spoke to [place] B. ) who took report.   [name] will inform care team.    Lum Croft, MSW, [facility] 08/21/2023 11:04 AM

## 2023-08-21 NOTE — Group Note (Signed)
 Recreation Therapy Group Note   Group Topic:Other  Group Date: 08/21/2023 Start Time: 1100 End Time: 1120 Facilitators: Celestia Jeoffrey BRAVO, LRT, CTRS Location: Dayroom  Activity Description/Intervention: Therapeutic Drumming. Patients with peers and staff were given the opportunity to engage in a leader facilitated [facility] Group Empowerment Drumming Circle with staff from the [employer], in partnership with [employer]. Teaching laboratory technician and trained [employer], Norleen Mon leading with LRT observing and documenting intervention and pt response. This evidenced-based practice targets 7 areas of health and wellbeing in the human experience including: stress-reduction, exercise, self-expression, camaraderie/support, nurturing, spirituality, and music-making (leisure).    Goal Area(s) Addresses:  Patient will engage in pro-social way in music group.  Patient will follow directions of drum leader on the first prompt. Patient will demonstrate no behavioral issues during group.  Patient will identify if a reduction in stress level occurs as a result of participation in therapeutic drum circle.     Affect/Mood: N/A   Participation Level: Did not attend    Clinical Observations/Individualized Feedback: Patient did not attend group.   Plan: Continue to engage patient in RT group sessions 2-3x/week.   Jeoffrey BRAVO Celestia, LRT, CTRS 08/21/2023 1:58 PM

## 2023-08-21 NOTE — Plan of Care (Signed)
  Problem: Coping: Goal: Coping ability will improve Outcome: Progressing Goal: Will verbalize feelings Outcome: Progressing   Problem: Role Relationship: Goal: Ability to interact with others will improve Outcome: Progressing   Problem: Self-Concept: Goal: Will verbalize positive feelings about self Outcome: Progressing

## 2023-08-21 NOTE — Progress Notes (Signed)
   08/21/23 1000  Psych Admission Type (Psych Patients Only)  Admission Status Voluntary  Psychosocial Assessment  Patient Complaints None  Eye Contact Fair  Facial Expression Worried  Affect Sad  Speech Logical/coherent  Interaction Assertive  Motor Activity Slow  Appearance/Hygiene In scrubs  Behavior Characteristics Cooperative  Mood Pleasant  Thought Process  Coherency WDL  Content WDL  Delusions None reported or observed  Perception WDL  Hallucination None reported or observed  Judgment WDL  Confusion Mild  Danger to Self  Current suicidal ideation? Denies  Danger to Others  Danger to Others None reported or observed

## 2023-08-21 NOTE — Group Note (Signed)
 Recreation Therapy Group Note   Group Topic:Stress Management  Group Date: 08/21/2023 Start Time: 1500 End Time: 1540 Facilitators: Celestia Jeoffrey BRAVO, LRT, CTRS Location: Dayroom  Group Description: Meditation. LRT and patients discussed what they know about meditation and mindfulness. LRT played a Deep Breathing Meditation exercise script for patients to follow along to. LRT and patients discussed how meditation and deep breathing can be used as a coping skill post--discharge to help manage symptoms of stress.    Goal Area(s) Addressed: Patient will practice using relaxation technique. Patient will identify a new coping skill.  Patient will follow multistep directions to reduce anxiety and stress.   Affect/Mood: Appropriate   Participation Level: Minimal    Clinical Observations/Individualized Feedback: Anandi was originally present in group. Pt saw MD at the nurses station and left the dayroom to go speak with her. Pt was present for less than half of group.   Plan: Continue to engage patient in RT group sessions 2-3x/week.   Jeoffrey BRAVO Celestia, LRT, CTRS 08/21/2023 4:10 PM

## 2023-08-21 NOTE — Group Note (Signed)
 Date:  08/21/2023 Time:  10:50 AM  Group Topic/Focus:  Goals Group:   The focus of this group is to help patients establish daily goals to achieve during treatment and discuss how the patient can incorporate goal setting into their daily lives to aide in recovery.    Participation Level:  Did Not Attend  Additional Comments:  Pt off unit   Bensyn Bornemann T Duffy Dantonio 08/21/2023, 10:50 AM

## 2023-08-21 NOTE — Progress Notes (Signed)
 Estimated Sleeping Duration (Last 24 Hours): 7.75-9.50 hours   (Sleep Hours) - 7.75-9.5 hrs (Any PRNs that were needed, meds refused, or side effects to meds)- Pt. Refused (norvasc  5mg  and coreg  12.5mg ) Patient  voiced, I don't need them my blood pressure is fine. Patient offered PRN vistaril  10mg  P.O. with HS medication for anxiety, noted to be effective.  (Any disturbances and when (visitation, over night)- none (Concerns raised by the patient)- None voiced (SI/HI/AVH)- Denied all   Patient slept for the remained of the night. Remains in bed rise and fall of chest noted.    08/21/23 0116  Psych Admission Type (Psych Patients Only)  Admission Status Voluntary  Psychosocial Assessment  Patient Complaints None  Eye Contact Fair  Facial Expression Anxious  Affect Depressed  Speech Logical/coherent  Interaction Assertive  Motor Activity Slow  Appearance/Hygiene In scrubs  Behavior Characteristics Cooperative  Mood Pleasant  Thought Process  Coherency WDL  Content WDL  Delusions None reported or observed  Perception WDL  Hallucination None reported or observed  Judgment WDL  Confusion Mild  Danger to Self  Current suicidal ideation? Denies  Danger to Others  Danger to Others None reported or observed

## 2023-08-21 NOTE — Group Note (Signed)
 [facility] LCSW Group Therapy Note   Group Date: 08/21/2023 Start Time: 1330 End Time: 1400   Type of Therapy/Topic:  Group Therapy:  Emotion Regulation  Participation Level:  Active   Mood:  Description of Group:    The purpose of this group is to assist patients in learning to regulate negative emotions and experience positive emotions. Patients will be guided to discuss ways in which they have been vulnerable to their negative emotions. These vulnerabilities will be juxtaposed with experiences of positive emotions or situations, and patients challenged to use positive emotions to combat negative ones. Special emphasis will be placed on coping with negative emotions in conflict situations, and patients will process healthy conflict resolution skills.  Therapeutic Goals: Patient will identify two positive emotions or experiences to reflect on in order to balance out negative emotions:  Patient will label two or more emotions that they find the most difficult to experience:  Patient will be able to demonstrate positive conflict resolution skills through discussion or role plays:   Summary of Patient Progress:   Pt participated throughout group appropriately     Therapeutic Modalities:   Cognitive Behavioral Therapy Feelings Identification Dialectical Behavioral Therapy   Lum JONETTA Croft, LCSWA

## 2023-08-21 NOTE — Plan of Care (Signed)
  Problem: Education: Goal: Will be free of psychotic symptoms Outcome: Progressing Goal: Knowledge of the prescribed therapeutic regimen will improve Outcome: Progressing   Problem: Coping: Goal: Coping ability will improve Outcome: Progressing Goal: Will verbalize feelings Outcome: Progressing   

## 2023-08-21 NOTE — Progress Notes (Signed)
 [facility]  ROUNDING NOTE   Subjective:   Tiffany [name]  is a 63 y.o.  female  with an extensive past medical history including schizophrenia, hypertension, CHF, diabetes, stroke, and end-stage renal disease on hemodialysis. [name] currently admitted at inpatient behavioral health for Schizoaffective disorder [facility]) [F25.9]  Patient is known to our practice from previous admission.  States she has missed 5 days of dialysis outpatient.  Did not feel like going.  Patient seen and evaluated during.     HEMODIALYSIS FLOWSHEET:  Blood Flow Rate (mL/min): 300 mL/min Arterial Pressure (mmHg): -126.46 mmHg Venous Pressure (mmHg): 93.53 mmHg TMP (mmHg): 20.2 mmHg Ultrafiltration Rate (mL/min): 679 mL/min Dialysate Flow Rate (mL/min): 300 ml/min  Seated in chair  Objective:  Vital signs in last 24 hours:  Temp:  [97.2 F (36.2 C)-98.1 F (36.7 C)] 97.2 F (36.2 C) (08/05 0722) Pulse Rate:  [82-96] 86 (08/05 1130) Resp:  [10-19] 14 (08/05 1130) BP: (131-163)/(77-105) 155/86 (08/05 1130) SpO2:  [96 %-100 %] 100 % (08/05 1130) Weight:  [85.1 kg] 85.1 kg (08/05 0804)  Weight change:  Filed Weights   08/17/23 1314 08/17/23 1605 08/21/23 0804  Weight: 84.9 kg 85 kg 85.1 kg    Intake/Output: No intake/output data recorded.   Intake/Output this shift:  No intake/output data recorded.  Physical Exam: General: NAD, seated in chair  Head: Normocephalic, atraumatic. Moist oral mucosal membranes  Eyes: Anicteric  Neck: Supple  Lungs:  Clear to auscultation, normal effort  Heart: Regular rate and rhythm  Abdomen:  Soft, nontender  Extremities: No peripheral edema.  Neurologic: Awake, alert, conversant  Skin: Warm,dry, no rash  Access: Right chest IJ PermCath    Basic Metabolic Panel: Recent Labs  Lab 08/15/23 1101 08/17/23 1430 08/21/23 0816  NA 131* 135 130*  K 2.7* 3.2* 3.0*  CL 90* 95* 88*  CO2 26 29 26   GLUCOSE 89 118* 91  BUN 77* 57* 70*   CREATININE 7.75* 4.26* 4.90*  CALCIUM  8.6* 8.7* 9.7  PHOS  --  2.3* 4.5    Liver Function Tests: Recent Labs  Lab 08/17/23 1430 08/21/23 0816  ALBUMIN  3.0* 2.9*   No results for input(s): LIPASE, AMYLASE in the last 168 hours. No results for input(s): AMMONIA  in the last 168 hours.  CBC: Recent Labs  Lab 08/17/23 1430 08/21/23 0816  WBC 6.8 7.9  HGB 8.0* 8.3*  HCT 25.6* 26.4*  MCV 91.4 90.4  PLT 355 337    Cardiac Enzymes: No results for input(s): CKTOTAL, CKMB, CKMBINDEX, TROPONINI in the last 168 hours.  BNP: Invalid input(s): POCBNP  CBG: No results for input(s): GLUCAP in the last 168 hours.  Microbiology: Results for orders placed or performed during the hospital encounter of 08/13/23  SARS Coronavirus 2 by RT PCR (hospital order, performed in [facility] hospital lab) *cepheid single result test* Anterior Nasal Swab     Status: None   Collection Time: 08/15/23 11:07 AM   Specimen: Anterior Nasal Swab  Result Value Ref Range Status   SARS Coronavirus 2 by RT PCR NEGATIVE NEGATIVE Final    Comment: Performed at [facility], [address] [place], [place] [address]   *Note: Due to a large number of results and/or encounters for the requested time period, some results have not been displayed. A complete set of results can be found in Results Review.    Coagulation Studies: No results for input(s): LABPROT, INR in the last 72 hours.  Urinalysis:  No results for input(s): COLORURINE, LABSPEC, PHURINE, GLUCOSEU, HGBUR, BILIRUBINUR, KETONESUR, PROTEINUR, UROBILINOGEN, NITRITE, LEUKOCYTESUR in the last 72 hours.  Invalid input(s): APPERANCEUR    Imaging: No results found.   Medications:     amLODipine   5 mg Oral BID   atorvastatin   20 mg Oral Daily   calcitRIOL   0.5 mcg Oral Daily   carvedilol   12.5 mg Oral BID   Chlorhexidine  Gluconate Cloth  6 each Topical Q0600   dicyclomine   10 mg Oral TID    docusate sodium   100 mg Oral Daily   escitalopram   5 mg Oral Daily   ezetimibe   10 mg Oral Daily   ferrous sulfate   325 mg Oral Daily   folic acid   1 mg Oral Daily   gabapentin   100 mg Oral QHS   melatonin  5 mg Oral QHS   metolazone   2.5 mg Oral Q M,W,F   mirtazapine   15 mg Oral QHS   paliperidone   9 mg Oral Daily   pantoprazole   40 mg Oral Daily   torsemide   40 mg Oral BID   albuterol , capsaicin , hydrOXYzine , OLANZapine , OLANZapine  zydis  Assessment/ Plan:  [name] is a 63 y.o.  female  with an extensive past medical history including schizophrenia, hypertension, CHF, diabetes, stroke, and end-stage renal disease on hemodialysis.   [facility] [place]/TTS/Rt permcath Time 10:25am  End-stage renal disease on hemodialysis.  Will maintain outpatient  TTS schedule during this admission.  Receiving dialysis today, UF 0.5-1L as tolerated. Next treatment scheduled for Thursday.   2. Anemia of chronic kidney disease Lab Results  Component Value Date   HGB 8.3 (L) 08/21/2023    Hemoglobin below optimal range.  Will order Retacrit  10000 units with dialysis.  3. Secondary Hyperparathyroidism: with outpatient labs: PTH 1310, phosphorus 5.9, calcium  8.4 on 07/26/2023.   Lab Results  Component Value Date   PTH 57 02/18/2020   CALCIUM  9.7 08/21/2023   CAION 1.03 (L) 06/14/2023   PHOS 4.5 08/21/2023    Will continue to monitor bone minerals during this admission    LOS: 6 Sai Zinn 8/5/202511:48 AM

## 2023-08-21 NOTE — Progress Notes (Signed)
  Received patient in bed to unit.   Informed consent signed and in chart.    TX duration: 3.5hrs     Transported back to floor by staff  Hand-off given to patient's nurse. Pt still c/o 9/10 neck pain.  Primary  nurse made aware    Access used: R HD Catheter Access issues: none   Total UF removed: 1.0L Medication(s) given:retacrit   Post HD VS: wnl Post HD weight: 84.3kg     Olivia Hurst LPN [facility]

## 2023-08-22 DIAGNOSIS — F259 Schizoaffective disorder, unspecified: Secondary | ICD-10-CM | POA: Diagnosis not present

## 2023-08-22 NOTE — Plan of Care (Signed)
  Problem: Activity: Goal: Will verbalize the importance of balancing activity with adequate rest periods Outcome: Progressing   Problem: Coping: Goal: Coping ability will improve Outcome: Progressing Goal: Will verbalize feelings Outcome: Progressing   Problem: Health Behavior/Discharge Planning: Goal: Compliance with prescribed medication regimen will improve Outcome: Progressing   Problem: Role Relationship: Goal: Ability to interact with others will improve Outcome: Progressing   Problem: Education: Goal: Knowledge of the prescribed therapeutic regimen will improve Outcome: Progressing

## 2023-08-22 NOTE — Progress Notes (Signed)
  Estimated Sleeping Duration (Last 24 Hours): 6.25-7.50 hours   (Sleep Hours) - 6.25-7.50 hrs (Any PRNs that were needed, meds refused, or side effects to meds)- none  (Any disturbances and when (visitation, over night)- none (Concerns raised by the patient)- none (SI/HI/AVH)- Denies all  Pt. Slept throughout the night. No complaints voiced. No distress noted/ reported. Pt. Remains in bed resting, rise/ fall of chest noted. All orders maintained as written.    08/22/23 0124  Psych Admission Type (Psych Patients Only)  Admission Status Voluntary  Psychosocial Assessment  Patient Complaints Anxiety  Eye Contact Fair  Facial Expression Anxious  Affect Labile  Speech Soft  Interaction Assertive  Motor Activity Slow  Appearance/Hygiene In scrubs  Behavior Characteristics Cooperative  Mood Labile  Thought Process  Coherency WDL  Content WDL  Delusions WDL  Perception WDL  Hallucination None reported or observed  Judgment WDL  Confusion Mild  Danger to Self  Current suicidal ideation? Denies  Danger to Others  Danger to Others None reported or observed

## 2023-08-22 NOTE — Progress Notes (Signed)
   08/22/23 1500  Psych Admission Type (Psych Patients Only)  Admission Status Voluntary  Psychosocial Assessment  Patient Complaints Anxiety  Eye Contact Fair  Facial Expression Worried  Affect Sad  Speech Logical/coherent  Interaction Assertive  Motor Activity Slow  Appearance/Hygiene In scrubs  Behavior Characteristics Cooperative  Mood Labile  Thought Process  Coherency WDL  Content WDL  Delusions None reported or observed  Perception WDL  Hallucination None reported or observed  Judgment WDL  Confusion Mild  Danger to Self  Current suicidal ideation? Denies  Danger to Others  Danger to Others None reported or observed
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
## 2023-08-22 NOTE — Group Note (Signed)
 Date:  08/22/2023 Time:  4:01 PM  Group Topic/Focus:  Self Care:   The focus of this group is to help patients understand the importance of self-care in order to improve or restore emotional, physical, spiritual, interpersonal, and financial health.    Participation Level:  Active  Participation Quality:  Appropriate  Affect:  Appropriate  Cognitive:  Appropriate  Insight: Good  Engagement in Group:  Engaged  Modes of Intervention:  Activity  Additional Comments:  N/A  [name] 08/22/2023, 4:01 PM

## 2023-08-22 NOTE — Group Note (Signed)
 Physical/Occupational Therapy Group Note  Group Topic: Yoga  Group Date: 08/22/2023 Start Time: 1300 End Time: 1330 Facilitators: Ronica Vivian, Alm Hamilton, PT   Group Description: Group participated with series of yoga poses, designed to emphasize functional sitting balance, core stability, generalized flexibility and overall posture.  Incorporated deep breathing techniques with poses, working to promote relaxation, mindfulness and focus with targeted activities.   Discussed benefits of yoga in improving mood and self-esteem, reducing stress and anxiety, and promoting functional strength and balance for each participant.  Discussed ways to integrate into each participant's daily routine.  Provided handout with written and pictorial descriptions of included yoga movements to be utilized as appropriate outside of group time.  Therapeutic Goal(s):  Demonstrate safe ability to participate with yoga poses during group activity. Identify one benefit of participation with yoga poses as part of each participant's exercise/movement routine. Identify 1-2 individual poses that participant feels most beneficial to his/her needs and that he/she can easily replicate outside of group.  Individual Participation: Pt actively participated with the discussion and physical activity components of the session.  Participation Level: Active and Engaged   Participation Quality: Moderate Cues   Behavior: Appropriate   Speech/Thought Process: Focused and Relevant   Affect/Mood: Appropriate   Insight: Moderate   Judgement: Moderate   Modes of Intervention: Activity, Discussion, and Education  Patient Response to Interventions:  Attentive, Engaged, and Interested    Plan: Continue to engage patient in PT/OT groups 1 - 2x/week.  CHARM Hamilton Bertin PT, DPT 08/22/23, 1:47 PM
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
## 2023-08-22 NOTE — BH IP Treatment Plan (Signed)
 Interdisciplinary Treatment and Diagnostic Plan Update  08/22/2023 Time of Session: 2:30 PM  Tiffany [name] MRN: [id number]  Principal Diagnosis: Schizoaffective disorder [facility])  Secondary Diagnoses: Principal Problem:   Schizoaffective disorder (HCC)   Current Medications:  Current Facility-Administered Medications  Medication Dose Route Frequency Provider Last Rate Last Admin   albuterol  (VENTOLIN  HFA) 108 (90 Base) MCG/ACT inhaler 2 puff  2 puff Inhalation Q6H PRN Mannie Jerel PARAS, NP   2 puff at 08/21/23 2205   amLODipine  (NORVASC ) tablet 5 mg  5 mg Oral BID Mannie Jerel PARAS, NP   5 mg at 08/21/23 2152   atorvastatin  (LIPITOR ) tablet 20 mg  20 mg Oral Daily Mannie Jerel PARAS, NP   20 mg at 08/22/23 9052   calcitRIOL  (ROCALTROL ) capsule 0.5 mcg  0.5 mcg Oral Daily Jadapalle, Sree, MD   0.5 mcg at 08/22/23 0947   capsaicin  (ZOSTRIX) 0.025 % cream   Topical BID PRN Onuoha, Chinwendu V, NP   Given at 08/21/23 2205   carvedilol  (COREG ) tablet 12.5 mg  12.5 mg Oral BID Mannie Jerel PARAS, NP   12.5 mg at 08/22/23 9052   Chlorhexidine  Gluconate Cloth 2 % PADS 6 each  6 each Topical Q0600 Druscilla Bald, NP       dicyclomine  (BENTYL ) capsule 10 mg  10 mg Oral TID Mannie Jerel PARAS, NP   10 mg at 08/22/23 0948   docusate sodium  (COLACE) capsule 100 mg  100 mg Oral Daily Shrivastava, Aryendra, MD   100 mg at 08/22/23 9052   epoetin  alfa-epbx (RETACRIT ) injection 10,000 Units  10,000 Units Intravenous Q T,Th,Sat-1800 Breeze, Bald, NP   10,000 Units at 08/21/23 1154   escitalopram  (LEXAPRO ) tablet 5 mg  5 mg Oral Daily Mannie Jerel PARAS, NP   5 mg at 08/22/23 0946   ezetimibe  (ZETIA ) tablet 10 mg  10 mg Oral Daily Jadapalle, Sree, MD   10 mg at 08/22/23 1001   ferrous sulfate  tablet 325 mg  325 mg Oral Daily Jadapalle, Sree, MD   325 mg at 08/22/23 9052   folic acid  (FOLVITE ) tablet 1 mg  1 mg Oral Daily Jadapalle, Sree, MD   1 mg at 08/22/23 9052   gabapentin  (NEURONTIN ) capsule 100 mg  100 mg  Oral QHS Mannie Jerel PARAS, NP   100 mg at 08/21/23 2152   hydrOXYzine  (ATARAX ) tablet 10 mg  10 mg Oral TID PRN Mannie Jerel PARAS, NP   10 mg at 08/20/23 2112   melatonin tablet 5 mg  5 mg Oral QHS Onuoha, Chinwendu V, NP   5 mg at 08/21/23 2152   metolazone  (ZAROXOLYN ) tablet 2.5 mg  2.5 mg Oral Q M,W,F Jadapalle, Sree, MD   2.5 mg at 08/22/23 9052   mirtazapine  (REMERON ) tablet 15 mg  15 mg Oral QHS Jadapalle, Sree, MD   15 mg at 08/21/23 2152   OLANZapine  (ZYPREXA ) injection 5 mg  5 mg Intramuscular TID PRN Mannie Jerel PARAS, NP       OLANZapine  zydis (ZYPREXA ) disintegrating tablet 5 mg  5 mg Oral TID PRN Mannie Jerel PARAS, NP   5 mg at 08/18/23 2318   paliperidone  (INVEGA ) 24 hr tablet 9 mg  9 mg Oral QHS Jadapalle, Sree, MD       pantoprazole  (PROTONIX ) EC tablet 40 mg  40 mg Oral Daily Shrivastava, Aryendra, MD   40 mg at 08/22/23 0947   torsemide  (DEMADEX ) tablet 40 mg  40 mg Oral BID Mannie Jerel PARAS, NP  40 mg at 08/22/23 0947   PTA Medications: Medications Prior to Admission  Medication Sig Dispense Refill Last Dose/Taking   albuterol  (VENTOLIN  HFA) 108 (90 Base) MCG/ACT inhaler Inhale 2 puffs into the lungs every 6 (six) hours as needed for wheezing or shortness of breath. 8.5 g 2    [Paused] amLODipine  (NORVASC ) 10 MG tablet Take 0.5 tablets (5 mg total) by mouth 2 (two) times daily. (Patient not taking: Reported on 08/14/2023) 180 tablet 0    Asenapine  Maleate (SAPHRIS ) 10 MG SUBL Place 10 mg under the tongue at bedtime. Only namebrand      atorvastatin  (LIPITOR ) 20 MG tablet Take 1 tablet (20 mg total) by mouth daily. 90 tablet 0    calcitRIOL  (ROCALTROL ) 0.5 MCG capsule Take 1 capsule (0.5 mcg total) by mouth daily. 90 capsule 0    carvedilol  (COREG ) 12.5 MG tablet Take 1 tablet (12.5 mg total) by mouth 2 (two) times daily. 180 tablet 0    cyclobenzaprine  (FLEXERIL ) 10 MG tablet Take 1 tablet (10 mg total) by mouth 3 (three) times daily. 90 tablet 5    dicyclomine  (BENTYL ) 10 MG  capsule Take 1 capsule (10 mg total) by mouth 3 (three) times daily. 270 capsule 0    DULoxetine  (CYMBALTA ) 30 MG capsule Take 1 capsule (30 mg total) by mouth daily. 90 capsule 0    epoetin  alfa (EPOGEN ) 10000 UNIT/ML injection Inject 1 mL (10,000 Units total) into the vein every Monday, Wednesday, and Friday with hemodialysis. 1 mL 3    escitalopram  (LEXAPRO ) 10 MG tablet Take 1 tablet (10 mg total) by mouth daily. 90 tablet 0    ezetimibe  (ZETIA ) 10 MG tablet Take 1 tablet (10 mg total) by mouth daily. Please call office to schedule an appt for further refills. Thank you 90 tablet 0    ferrous sulfate  325 (65 FE) MG tablet Take 1 tablet (325 mg total) by mouth daily. 90 tablet 3    Fluticasone -Umeclidin-Vilant (TRELEGY ELLIPTA ) 100-62.5-25 MCG/ACT AEPB Inhale 1 puff into the lungs daily. 60 each 3    folic acid  (FOLVITE ) 1 MG tablet Take 1 tablet (1 mg total) by mouth daily. 90 tablet 0    gabapentin  (NEURONTIN ) 100 MG capsule Take 1 capsule (100 mg total) by mouth at bedtime. 90 capsule 0    hydrOXYzine  (ATARAX ) 10 MG tablet Take 1 tablet (10 mg total) by mouth 3 (three) times daily as needed for anxiety. (Patient taking differently: Take 10 mg by mouth 3 (three) times daily.) 90 tablet 0    lamoTRIgine  (LAMICTAL ) 100 MG tablet Take 1 tablet (100 mg total) by mouth at bedtime. 90 tablet 0    lidocaine  (LIDODERM ) 5 % Place 1 patch onto the skin daily. Remove & Discard patch within 12 hours or as directed by MD (Patient taking differently: Place 1 patch onto the skin daily as needed (pain).) 3 patch 0    loperamide  (IMODIUM ) 2 MG capsule Take 1 capsule (2 mg total) by mouth as needed for diarrhea or loose stools. 30 capsule 1    MELATONIN PO Take 1 tablet by mouth at bedtime.      metolazone  (ZAROXOLYN ) 2.5 MG tablet Take 1 tablet (2.5 mg total) by mouth every Monday, Wednesday, and Friday. 90 tablet 0    mirtazapine  (REMERON ) 7.5 MG tablet Take 2 tablets (15 mg total) by mouth at bedtime. 180  tablet 0    Multiple Vitamins-Minerals (CENTRUM SILVER  50+WOMEN) TABS Take 1 tablet by mouth at bedtime.  omeprazole  (PRILOSEC) 40 MG capsule Take 1 capsule (40 mg total) by mouth every morning. 90 capsule 0    prazosin  (MINIPRESS ) 2 MG capsule Take 1 capsule (2 mg total) by mouth at bedtime. (Patient not taking: Reported on 08/14/2023) 30 capsule 5    QUEtiapine  (SEROQUEL ) 100 MG tablet Take 100-200 mg by mouth 2 (two) times daily. Take one tablet by mouth in morning. Take two tablets by mouth in the afternoon.      torsemide  40 MG TABS Take 40 mg by mouth 2 (two) times daily. 60 tablet 3     Patient Stressors: Marital or family conflict    Patient Strengths: Communication skills   Treatment Modalities: Medication Management, Group therapy, Case management,  1 to 1 session with clinician, Psychoeducation, Recreational therapy.   Physician Treatment Plan for Primary Diagnosis: Schizoaffective disorder (HCC) Long Term Goal(s): Improvement in symptoms so as ready for discharge   Short Term Goals: Ability to identify changes in lifestyle to reduce recurrence of condition will improve Ability to verbalize feelings will improve Ability to disclose and discuss suicidal ideas Ability to demonstrate self-control will improve Ability to identify and develop effective coping behaviors will improve  Medication Management: Evaluate patient's response, side effects, and tolerance of medication regimen.  Therapeutic Interventions: 1 to 1 sessions, Unit Group sessions and Medication administration.  Evaluation of Outcomes: Progressing  Physician Treatment Plan for Secondary Diagnosis: Principal Problem:   Schizoaffective disorder (HCC)  Long Term Goal(s): Improvement in symptoms so as ready for discharge   Short Term Goals: Ability to identify changes in lifestyle to reduce recurrence of condition will improve Ability to verbalize feelings will improve Ability to disclose and discuss  suicidal ideas Ability to demonstrate self-control will improve Ability to identify and develop effective coping behaviors will improve     Medication Management: Evaluate patient's response, side effects, and tolerance of medication regimen.  Therapeutic Interventions: 1 to 1 sessions, Unit Group sessions and Medication administration.  Evaluation of Outcomes: Progressing   RN Treatment Plan for Primary Diagnosis: Schizoaffective disorder (HCC) Long Term Goal(s): Knowledge of disease and therapeutic regimen to maintain health will improve  Short Term Goals: Ability to remain free from injury will improve, Ability to verbalize frustration and anger appropriately will improve, Ability to demonstrate self-control, Ability to participate in decision making will improve, Ability to verbalize feelings will improve, Ability to disclose and discuss suicidal ideas, Ability to identify and develop effective coping behaviors will improve, and Compliance with prescribed medications will improve  Medication Management: RN will administer medications as ordered by provider, will assess and evaluate patient's response and provide education to patient for prescribed medication. RN will report any adverse and/or side effects to prescribing provider.  Therapeutic Interventions: 1 on 1 counseling sessions, Psychoeducation, Medication administration, Evaluate responses to treatment, Monitor vital signs and CBGs as ordered, Perform/monitor CIWA, COWS, AIMS and Fall Risk screenings as ordered, Perform wound care treatments as ordered.  Evaluation of Outcomes: Progressing   LCSW Treatment Plan for Primary Diagnosis: Schizoaffective disorder (HCC) Long Term Goal(s): Safe transition to appropriate next level of care at discharge, Engage patient in therapeutic group addressing interpersonal concerns.  Short Term Goals: Engage patient in aftercare planning with referrals and resources, Increase social support,  Increase ability to appropriately verbalize feelings, Increase emotional regulation, Facilitate acceptance of mental health diagnosis and concerns, Facilitate patient progression through stages of change regarding substance use diagnoses and concerns, Identify triggers associated with mental health/substance abuse issues, and Increase skills  for wellness and recovery  Therapeutic Interventions: Assess for all discharge needs, 1 to 1 time with Social worker, Explore available resources and support systems, Assess for adequacy in community support network, Educate family and significant other(s) on suicide prevention, Complete Psychosocial Assessment, Interpersonal group therapy.  Evaluation of Outcomes: Progressing   Progress in Treatment: Attending groups: Yes. and No. Participating in groups: Yes. and No. Taking medication as prescribed: Yes. and No. Toleration medication: Yes. Family/Significant other contact made: No, will contact:  [name] will contact if given permission Patient understands diagnosis: Yes. Discussing patient identified problems/goals with staff: Yes. Medical problems stabilized or resolved: Yes. Denies suicidal/homicidal ideation: Yes. Issues/concerns per patient self-inventory: No. Other: None    New problem(s) identified: No, Describe:  None identified Update 08/22/23: No changes at this time    New Short Term/Long Term Goal(s): elimination of symptoms of psychosis, medication management for mood stabilization; elimination of SI thoughts; development of comprehensive mental wellness plan. Update 08/22/23: No changes at this time   Patient Goals:  My goal is to first try to get myself together, try to be in an environment with no kind of abuse or on the street Update 08/22/23: No changes at this time   Discharge Plan or Barriers: [name] will assist with appropriate discharge planning Update 08/22/23: No changes at this time   Reason for Continuation of Hospitalization:  Depression Medication stabilization   Estimated Length of Stay: 1 to 7 days Update 08/22/23: TBD  Last 3 [place] Suicide Severity Risk Score: Flowsheet Row Admission (Current) from 08/15/2023 in [facility] BEHAVIORAL [facility] ED from 08[facility]13/2023 in [facility] Emergency Department at [facility] ED from 07/14/2023 in [facility] Emergency Department at [facility]  C-SSRS RISK CATEGORY No Risk No Risk No Risk    Last [facility] 2/9 Scores:    08/14/2023    8:50 AM 07/11/2022    1:42 PM 06/09/2022    3:51 PM  Depression screen PHQ 2/9  Decreased Interest 2 1 0  Down, Depressed, Hopeless 2  0  PHQ - 2 Score 4 1 0  Altered sleeping 0 0 0  Tired, decreased energy 2 1 3   Change in appetite 2 0 0  Feeling bad or failure about yourself  1 0 0  Trouble concentrating 1  0  Moving slowly or fidgety/restless 0 0 0  Suicidal thoughts 0 0 0  PHQ-9 Score 10 2 3   Difficult doing work/chores Very difficult Not difficult at all Somewhat difficult    Scribe for Treatment Team: Lum JONETTA Croft, [place] 08/22/2023 4:03 PM

## 2023-08-22 NOTE — Progress Notes (Signed)
   08/22/23 2200  Psych Admission Type (Psych Patients Only)  Admission Status Voluntary  Psychosocial Assessment  Patient Complaints Anxiety  Eye Contact Fair  Facial Expression Animated  Affect Anxious;Appropriate to circumstance  Speech Logical/coherent  Interaction Assertive  Motor Activity Slow  Appearance/Hygiene In scrubs  Behavior Characteristics Cooperative  Mood Pleasant;Anxious  Thought Process  Coherency WDL  Content WDL  Delusions None reported or observed  Perception WDL  Hallucination None reported or observed  Judgment WDL  Confusion Mild  Danger to Self  Current suicidal ideation? Denies  Danger to Others  Danger to Others None reported or observed

## 2023-08-23 DIAGNOSIS — F259 Schizoaffective disorder, unspecified: Secondary | ICD-10-CM | POA: Diagnosis not present

## 2023-08-23 LAB — CBC WITH DIFFERENTIAL/PLATELET
Abs Immature Granulocytes: 0.05 K/uL (ref 0.00–0.07)
Basophils Absolute: 0.1 K/uL (ref 0.0–0.1)
Basophils Relative: 1 %
Eosinophils Absolute: 0.2 K/uL (ref 0.0–0.5)
Eosinophils Relative: 2 %
HCT: 26.2 % — ABNORMAL LOW (ref 36.0–46.0)
Hemoglobin: 8.3 g/dL — ABNORMAL LOW (ref 12.0–15.0)
Immature Granulocytes: 1 %
Lymphocytes Relative: 15 %
Lymphs Abs: 1.5 K/uL (ref 0.7–4.0)
MCH: 29.3 pg (ref 26.0–34.0)
MCHC: 31.7 g/dL (ref 30.0–36.0)
MCV: 92.6 fL (ref 80.0–100.0)
Monocytes Absolute: 0.6 K/uL (ref 0.1–1.0)
Monocytes Relative: 6 %
Neutro Abs: 7.7 K/uL (ref 1.7–7.7)
Neutrophils Relative %: 75 %
Platelets: 314 K/uL (ref 150–400)
RBC: 2.83 MIL/uL — ABNORMAL LOW (ref 3.87–5.11)
RDW: 16.2 % — ABNORMAL HIGH (ref 11.5–15.5)
WBC: 10 K/uL (ref 4.0–10.5)
nRBC: 0 % (ref 0.0–0.2)

## 2023-08-23 LAB — RENAL FUNCTION PANEL
Albumin: 3.4 g/dL — ABNORMAL LOW (ref 3.5–5.0)
Anion gap: 11 (ref 5–15)
BUN: 56 mg/dL — ABNORMAL HIGH (ref 8–23)
CO2: 26 mmol/L (ref 22–32)
Calcium: 9.7 mg/dL (ref 8.9–10.3)
Chloride: 95 mmol/L — ABNORMAL LOW (ref 98–111)
Creatinine, Ser: 3.95 mg/dL — ABNORMAL HIGH (ref 0.44–1.00)
GFR, Estimated: 12 mL/min — ABNORMAL LOW (ref >60)
Glucose, Bld: 99 mg/dL (ref 70–99)
Phosphorus: 4.2 mg/dL (ref 2.5–4.6)
Potassium: 2.9 mmol/L — ABNORMAL LOW (ref 3.5–5.1)
Sodium: 132 mmol/L — ABNORMAL LOW (ref 135–145)

## 2023-08-23 MED ORDER — HEPARIN SODIUM (PORCINE) 1000 UNIT/ML DIALYSIS: 1000.0000 [IU] | INTRAMUSCULAR | Status: DC | PRN

## 2023-08-23 MED ORDER — ALTEPLASE 2 MG IJ SOLR
2.0000 mg | Freq: Once | INTRAMUSCULAR | Status: DC | PRN
Start: 2023-08-23 — End: 2023-08-29

## 2023-08-23 MED ORDER — ALTEPLASE 2 MG IJ SOLR: 2.0000 mg | Freq: Once | INTRAMUSCULAR | Status: DC | PRN

## 2023-08-23 MED ORDER — HEPARIN SODIUM (PORCINE) 1000 UNIT/ML DIALYSIS: 25.0000 [IU]/kg | INTRAMUSCULAR | Status: DC | PRN

## 2023-08-23 NOTE — Progress Notes (Signed)
 [facility]  ROUNDING NOTE   Subjective:   Tiffany [name]  is a 63 y.o.  female  with an extensive past medical history including schizophrenia, hypertension, CHF, diabetes, stroke, and end-stage renal disease on hemodialysis. [name] currently admitted at inpatient behavioral health for Schizoaffective disorder [facility]) [F25.9]  Patient is known to our practice from previous admission.  States she has missed 5 days of dialysis outpatient.  Did not feel like going.  Patient seen and evaluated during.   Patient seen and evaluated during dialysis   HEMODIALYSIS FLOWSHEET:  Blood Flow Rate (mL/min): 300 mL/min Arterial Pressure (mmHg): 100 mmHg Venous Pressure (mmHg): 90 mmHg TMP (mmHg): 16 mmHg Ultrafiltration Rate (mL/min): 375 mL/min Dialysate Flow Rate (mL/min): 300 ml/min Dialysis Fluid Bolus: Normal Saline  Tolerating treatment seated in chair  Objective:  Vital signs in last 24 hours:  Temp:  [97.8 F (36.6 C)-98.2 F (36.8 C)] 97.8 F (36.6 C) (08/07 0931) Pulse Rate:  [74-91] 89 (08/07 1130) Resp:  [14-20] 19 (08/07 1130) BP: (129-145)/(76-91) 142/79 (08/07 1130) SpO2:  [100 %] 100 % (08/07 1100)  Weight change:  Filed Weights   08/17/23 1605 08/21/23 0804 08/21/23 1221  Weight: 85 kg 85.1 kg 84.3 kg    Intake/Output: No intake/output data recorded.   Intake/Output this shift:  No intake/output data recorded.  Physical Exam: General: NAD, seated in chair  Head: Normocephalic, atraumatic. Moist oral mucosal membranes  Eyes: Anicteric  Neck: Supple  Lungs:  Clear to auscultation, normal effort  Heart: Regular rate and rhythm  Abdomen:  Soft, nontender  Extremities: No peripheral edema.  Neurologic: Awake, alert, conversant  Skin: Warm,dry, no rash  Access: Right chest IJ PermCath    Basic Metabolic Panel: Recent Labs  Lab 08/17/23 1430 08/21/23 0816 08/23/23 0947  NA 135 130* 132*  K 3.2* 3.0* 2.9*  CL 95* 88* 95*  CO2 29 26 26    GLUCOSE 118* 91 99  BUN 57* 70* 56*  CREATININE 4.26* 4.90* 3.95*  CALCIUM  8.7* 9.7 9.7  PHOS 2.3* 4.5 4.2    Liver Function Tests: Recent Labs  Lab 08/17/23 1430 08/21/23 0816 08/23/23 0947  ALBUMIN  3.0* 2.9* 3.4*   No results for input(s): LIPASE, AMYLASE in the last 168 hours. No results for input(s): AMMONIA  in the last 168 hours.  CBC: Recent Labs  Lab 08/17/23 1430 08/21/23 0816 08/23/23 0947  WBC 6.8 7.9 10.0  NEUTROABS  --   --  7.7  HGB 8.0* 8.3* 8.3*  HCT 25.6* 26.4* 26.2*  MCV 91.4 90.4 92.6  PLT 355 337 314    Cardiac Enzymes: No results for input(s): CKTOTAL, CKMB, CKMBINDEX, TROPONINI in the last 168 hours.  BNP: Invalid input(s): POCBNP  CBG: No results for input(s): GLUCAP in the last 168 hours.  Microbiology: Results for orders placed or performed during the hospital encounter of 08/13/23  SARS Coronavirus 2 by RT PCR (hospital order, performed in [facility] hospital lab) *cepheid single result test* Anterior Nasal Swab     Status: None   Collection Time: 08/15/23 11:07 AM   Specimen: Anterior Nasal Swab  Result Value Ref Range Status   SARS Coronavirus 2 by RT PCR NEGATIVE NEGATIVE Final    Comment: Performed at [facility], [address] [place], [place] [address]   *Note: Due to a large number of results and/or encounters for the requested time period, some results have not been displayed. A complete set of results can be found  in Results Review.    Coagulation Studies: No results for input(s): LABPROT, INR in the last 72 hours.  Urinalysis: No results for input(s): COLORURINE, LABSPEC, PHURINE, GLUCOSEU, HGBUR, BILIRUBINUR, KETONESUR, PROTEINUR, UROBILINOGEN, NITRITE, LEUKOCYTESUR in the last 72 hours.  Invalid input(s): APPERANCEUR    Imaging: No results found.   Medications:     amLODipine   5 mg Oral BID   atorvastatin   20 mg Oral Daily   calcitRIOL   0.5 mcg Oral  Daily   carvedilol   12.5 mg Oral BID   Chlorhexidine  Gluconate Cloth  6 each Topical Q0600   dicyclomine   10 mg Oral TID   docusate sodium   100 mg Oral Daily   epoetin  alfa-epbx (RETACRIT ) injection  10,000 Units Intravenous Q T,Th,Sat-1800   escitalopram   5 mg Oral Daily   ezetimibe   10 mg Oral Daily   ferrous sulfate   325 mg Oral Daily   folic acid   1 mg Oral Daily   gabapentin   100 mg Oral QHS   melatonin  5 mg Oral QHS   metolazone   2.5 mg Oral Q M,W,F   mirtazapine   15 mg Oral QHS   paliperidone   9 mg Oral QHS   pantoprazole   40 mg Oral Daily   torsemide   40 mg Oral BID   albuterol , alteplase , capsaicin , heparin , heparin , hydrOXYzine , OLANZapine , OLANZapine  zydis  Assessment/ Plan:  Tiffany [name] is a 63 y.o.  female  with an extensive past medical history including schizophrenia, hypertension, CHF, diabetes, stroke, and end-stage renal disease on hemodialysis.   [facility] [place]/TTS/Rt permcath Time 10:25am  End-stage renal disease on hemodialysis.  Will maintain outpatient  TTS schedule during this admission.  Receiving dialysis today, UF low. Next treatment scheduled for Saturday.   2. Anemia of chronic kidney disease Lab Results  Component Value Date   HGB 8.3 (L) 08/23/2023    Hemoglobin remains decreased.  Continue Retacrit  10000 units with dialysis.  3. Secondary Hyperparathyroidism: with outpatient labs: PTH 1310, phosphorus 5.9, calcium  8.4 on 07/26/2023.   Lab Results  Component Value Date   PTH 57 02/18/2020   CALCIUM  9.7 08/23/2023   CAION 1.03 (L) 06/14/2023   PHOS 4.2 08/23/2023    Will continue to monitor bone minerals during this admission    LOS: 8 Sophonie Goforth 8/7/202512:00 PM

## 2023-08-23 NOTE — Group Note (Signed)
 Date:  08/23/2023 Time:  9:14 PM  Group Topic/Focus:  Self Care:   The focus of this group is to help patients understand the importance of self-care in order to improve or restore emotional, physical, spiritual, interpersonal, and financial health. MHT went over self care tips with pt's and reminded them that their health is very important.    Participation Level:  Active  Participation Quality:  Appropriate  Affect:  Appropriate  Cognitive:  Alert  Insight: Appropriate  Engagement in Group:  Engaged  Modes of Intervention:  Discussion  Additional Comments:    Leigh VEAR Pais 08/23/2023, 9:14 PM

## 2023-08-23 NOTE — Group Note (Signed)
 Recreation Therapy Group Note   Group Topic:Health and Wellness  Group Date: 08/23/2023 Start Time: 1100 End Time: 1135 Facilitators: Celestia Jeoffrey BRAVO, LRT, CTRS Location: Dayroom  Group Description: Seated Exercise. LRT discussed the mental and physical benefits of exercise. LRT and group discussed how physical activity can be used as a coping skill. Pt's and LRT followed along to an exercise video on the TV screen that provided a visual representation and audio description of every exercise performed. Pt's encouraged to listen to their bodies and stop at any time if they experience feelings of discomfort or pain. Pts were encouraged to drink water  and stay hydrated.   Goal Area(s) Addressed: Patient will learn benefits of physical activity. Patient will identify exercise as a coping skill.  Patient will follow multistep directions. Patient will try a new leisure interest.    Affect/Mood: N/A   Participation Level: Did not attend    Clinical Observations/Individualized Feedback: Patient did not attend group.   Plan: Continue to engage patient in RT group sessions 2-3x/week.   Jeoffrey BRAVO Celestia, LRT, CTRS 08/23/2023 1:56 PM

## 2023-08-23 NOTE — BHH Counselor (Signed)
 [name] provided pt DV resources per her request.   Lum Croft, MSW, [name] 08/23/2023 2:37 PM

## 2023-08-23 NOTE — Group Note (Signed)
 LCSW Group Therapy Note  Group Date: 08/23/2023 Start Time: 1315 End Time: 1400   Type of Therapy and Topic:  Group Therapy: Positive Affirmations  Participation Level:  Did Not Attend   Description of Group:   This group addressed positive affirmation towards self and others.  Patients went around the room and identified two positive things about themselves and two positive things about a peer in the room.  Patients reflected on how it felt to share something positive with others, to identify positive things about themselves, and to hear positive things from others/ Patients were encouraged to have a daily reflection of positive characteristics or circumstances.   Therapeutic Goals: Patients will verbalize two of their positive qualities Patients will demonstrate empathy for others by stating two positive qualities about a peer in the group Patients will verbalize their feelings when voicing positive self affirmations and when voicing positive affirmations of others Patients will discuss the potential positive impact on their wellness/recovery of focusing on positive traits of self and others.  Summary of Patient Progress: X  Therapeutic Modalities:   Cognitive Behavioral Therapy Motivational Interviewing    Tiffany [name], [place] 08/23/2023  2:13 PM

## 2023-08-23 NOTE — Plan of Care (Signed)
  Problem: Coping: Goal: Coping ability will improve Outcome: Progressing Goal: Will verbalize feelings Outcome: Progressing   Problem: Role Relationship: Goal: Ability to interact with others will improve Outcome: Progressing

## 2023-08-23 NOTE — BHH Counselor (Signed)
 [name] researched ACT Teams that pt might qualify for. Pt does not have Medicaid so she will not qualify for Strategic Interventions or Psychotherapeutic Services.   Lum Croft, MSW, [place] 08/23/2023 2:37 PM

## 2023-08-23 NOTE — Group Note (Signed)
 Date:  08/23/2023 Time:  11:27 AM  Group Topic/Focus:  Dimensions of Wellness:   The focus of this group is to introduce the topic of wellness and discuss the role each dimension of wellness plays in total health.    Participation Level:  Did Not Attend   [name] 08/23/2023, 11:27 AM

## 2023-08-23 NOTE — Plan of Care (Signed)
 ?  Problem: Education: ?Goal: Knowledge of the prescribed therapeutic regimen will improve ?Outcome: Progressing ?  ?Problem: Coping: ?Goal: Coping ability will improve ?Outcome: Progressing ?Goal: Will verbalize feelings ?Outcome: Progressing ?  ?
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
## 2023-08-23 NOTE — Group Note (Signed)
 Recreation Therapy Group Note   Group Topic:Stress Management  Group Date: 08/23/2023 Start Time: 1500 End Time: 1600 Facilitators: Celestia Jeoffrey BRAVO, LRT, CTRS Location: Courtyard  Group Description: Outdoor Recreation. Patients had the option to play corn hole, ring toss, bowling or listening to music while outside in the courtyard getting fresh air and sunlight. Patients helped water  and prune the raised garden beds. LRT and patients discussed things that they enjoy doing in their free time outside of the hospital. LRT encouraged patients to drink water  after being active and getting their heart rate up.   Goal Area(s) Addressed: Patient will identify leisure interests.  Patient will practice healthy decision making. Patient will engage in recreation activity   Affect/Mood: Appropriate   Participation Level: Active and Engaged   Participation Quality: Independent   Behavior: Appropriate, Calm, and Cooperative   Speech/Thought Process: Coherent   Insight: Good   Judgement: Good   Modes of Intervention: Music, Rapport Building, and Socialization   Patient Response to Interventions:  Attentive, Engaged, Interested , and Receptive   Education Outcome:  Acknowledges education   Clinical Observations/Individualized Feedback: Tiffany [name] was active in their participation of session activities and group discussion. Pt was observed singing and dancing along with the music being played from the speaker.   Plan: Continue to engage patient in RT group sessions 2-3x/week.   [address], LRT, CTRS 08/23/2023 5:18 PM

## 2023-08-23 NOTE — Progress Notes (Signed)
(  Sleep Hours) - 6 (Any PRNs that were needed, meds refused, or side effects to meds)- no prns, no meds refused, no side effects to meds  (Any disturbances and when (visitation, over night)- N/A (Concerns raised by the patient)- N/A (SI/HI/AVH)- DENIES

## 2023-08-23 NOTE — Group Note (Signed)
 Date:  08/23/2023 Time:  4:05 AM  Group Topic/Focus:  Wrap-Up Group:   The focus of this group is to help patients review their daily goal of treatment and discuss progress on daily workbooks.    Participation Level:  Active  Participation Quality:  Appropriate  Affect:  Appropriate  Cognitive:  Alert  Insight: Appropriate  Engagement in Group:  Engaged  Modes of Intervention:  Discussion  Additional Comments:    Tiffany [name] 08/23/2023, 4:05 AM

## 2023-08-23 NOTE — Progress Notes (Signed)
   08/23/23 1328  Vitals  Temp 98.2 F (36.8 C)  BP (!) 156/78  Pulse Rate 79  Resp 19  Type of Weight Post-Dialysis  Oxygen  Therapy  SpO2 100 %  O2 Device Room Air  Patient Activity (if Appropriate) In chair  Pulse Oximetry Type Continuous  Oximetry Probe Site Changed No  Post Treatment  Dialyzer Clearance Clear  Liters Processed 61  Fluid Removed (mL) 500 mL  Tolerated HD Treatment Yes  Post-Hemodialysis Comments goal has been met. tx has been tolerated well.0.5L have been removed. Patient complained of slight cramping post treatment

## 2023-08-23 NOTE — BHH Counselor (Signed)
 [name] sent in referral to [facility] for ACT Services.   Lum Croft, MSW, [place] 08/23/2023 2:32 PM

## 2023-08-23 NOTE — Progress Notes (Signed)
 Patient is pleasant and cooperative.  Endorses anxiety.  Denies SI/HI and AVH.  Pain rated 7/10 (generalized).    Compliant with scheduled medications given after patient returned from dialysis.  15 min checks in place for safety.  Patient is present in the milieu.  Appropriate interaction with peers and staff.

## 2023-08-24 NOTE — Plan of Care (Signed)
  Problem: Coping: Goal: Will verbalize feelings Outcome: Progressing   Problem: Nutritional: Goal: Ability to achieve adequate nutritional intake will improve Outcome: Progressing   Problem: Role Relationship: Goal: Ability to communicate needs accurately will improve Outcome: Progressing Goal: Ability to interact with others will improve Outcome: Progressing

## 2023-08-24 NOTE — Group Note (Signed)
 Physical/Occupational Therapy Group Note  Group Topic: Pain Management and Coping   Group Date: 08/24/2023 Start Time: 1300 End Time: 1350 Facilitators: Clive Warren CROME, OT   Group Description:  Group discussed impact of chronic/acute pain on safety and independence with functional tasks and impact on mental health.  Identified and discussed any previously learned or implemented strategies used.  Discussed and reviewed cognitive behavioral pain coping strategies to address/improve overall management of pain. Discussed relaxation, distraction techniques, cognitive restructuring, activity pacing/energy conservation, environment/home safety modifications, and role of sleep and sleep hygiene. Allowed time for questions and further discussion.   Therapeutic Goal(s):   Identify and discuss previously utilized pain coping strategies and implications of pain on function/well-being.  Identify and discuss implementing new cognitive behavioral pain coping strategies into daily routines.  Demonstrate understanding and performance of learned cognitive behavioral pain coping strategies.   Individual Participation: Pt pleasant and engaged throughout. Verbally contributes with prompts and actively participated in the progressive muscle relaxation exercise, endorsing feeling that it was helpful afterwards. Able to independently identify preferences for how to utilize pleasant imagery.   Participation Level: Active and Engaged   Participation Quality: Independent   Behavior: Alert, Appropriate, and Attentive    Speech/Thought Process: Coherent, Focused, and Organized   Affect/Mood: Appropriate and Stable    Insight: Good   Judgement: Good   Modes of Intervention: Activity, Clarification, Discussion, Education, Exploration, Problem-solving, and Socialization  Patient Response to Interventions:  Attentive, Engaged, Interested , and Receptive   Plan: Continue to engage patient in PT/OT groups 1 -  2x/week.  Mega Kinkade R., MPH, MS, OTR/L ascom [phone] 08/24/23, 4:32 PM

## 2023-08-24 NOTE — Plan of Care (Signed)
  Problem: Activity: Goal: Will verbalize the importance of balancing activity with adequate rest periods Outcome: Progressing   Problem: Education: Goal: Will be free of psychotic symptoms Outcome: Progressing Goal: Knowledge of the prescribed therapeutic regimen will improve Outcome: Progressing   Problem: Coping: Goal: Coping ability will improve Outcome: Progressing Goal: Will verbalize feelings Outcome: Progressing   Problem: Health Behavior/Discharge Planning: Goal: Compliance with prescribed medication regimen will improve Outcome: Progressing   Problem: Nutritional: Goal: Ability to achieve adequate nutritional intake will improve Outcome: Progressing   Problem: Role Relationship: Goal: Ability to communicate needs accurately will improve Outcome: Progressing Goal: Ability to interact with others will improve Outcome: Progressing   Problem: Safety: Goal: Ability to redirect hostility and anger into socially appropriate behaviors will improve Outcome: Progressing Goal: Ability to remain free from injury will improve Outcome: Progressing   Problem: Self-Care: Goal: Ability to participate in self-care as condition permits will improve Outcome: Progressing   Problem: Self-Concept: Goal: Will verbalize positive feelings about self Outcome: Progressing   Problem: Education: Goal: Utilization of techniques to improve thought processes will improve Outcome: Progressing Goal: Knowledge of the prescribed therapeutic regimen will improve Outcome: Progressing

## 2023-08-24 NOTE — Group Note (Signed)
 Date:  08/25/2023 Time:  12:36 AM  Group Topic/Focus:  Wrap-Up Group:   The focus of this group is to help patients review their daily goal of treatment and discuss progress on daily workbooks.    Participation Level:  Active  Participation Quality:  Appropriate  Affect:  Appropriate  Cognitive:  Alert  Insight: Appropriate  Engagement in Group:  Engaged  Modes of Intervention:  Discussion  Additional Comments:    Tiffany [name] Bunker 08/25/2023, 12:36 AM
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
## 2023-08-24 NOTE — Group Note (Signed)
 Recreation Therapy Group Note   Group Topic:Leisure Education  Group Date: 08/24/2023 Start Time: 1500 End Time: 1600 Facilitators: Celestia Jeoffrey BRAVO, LRT, CTRS Location: Courtyard  Group Description: Outdoor Recreation. Patients had the option to play corn hole, ring toss, bowling or listening to music while outside in the courtyard getting fresh air and sunlight. Patients helped water  and prune the raised garden beds. LRT and patients discussed things that they enjoy doing in their free time outside of the hospital. LRT encouraged patients to drink water  after being active and getting their heart rate up.   Goal Area(s) Addressed: Patient will identify leisure interests.  Patient will practice healthy decision making. Patient will engage in recreation activity.   Affect/Mood: Appropriate   Participation Level: Active and Engaged   Participation Quality: Independent   Behavior: Appropriate, Calm, and Cooperative   Speech/Thought Process: Coherent   Insight: Good   Judgement: Good   Modes of Intervention: Cooperative Play, Music, Rapport Building, and Socialization   Patient Response to Interventions:  Attentive, Engaged, Interested , and Receptive   Education Outcome:  Acknowledges education   Clinical Observations/Individualized Feedback: Biance was active in their participation of session activities and group discussion. Pt played corn hole with LRT and peers while outside in group. Pt interacted well with LRT and peers duration of session.    Plan: Continue to engage patient in RT group sessions 2-3x/week.   [address] LRT, CTRS 08/24/2023 5:27 PM

## 2023-08-24 NOTE — Progress Notes (Signed)
 Patient is pleasant and cooperative.  Patient tearful while speaking with this Clinical research associate.  Endorses depression, worrying and racing negative thoughts.  Denies SI/HI and AVH.  Denies pain.    Compliant with scheduled medications.  15 min checks in place for safety. Patient is present in the milieu.  Participates in groups.  Appropriate interreactions with peers and staff.

## 2023-08-24 NOTE — Progress Notes (Signed)
   08/24/23 2100  Psych Admission Type (Psych Patients Only)  Admission Status Voluntary  Psychosocial Assessment  Patient Complaints Depression  Eye Contact Fair  Facial Expression Animated  Affect Appropriate to circumstance  Speech Logical/coherent  Interaction Assertive  Motor Activity Other (Comment) (wheelchiar)  Appearance/Hygiene In scrubs  Behavior Characteristics Cooperative;Appropriate to situation  Mood Pleasant  Thought Process  Coherency WDL  Content WDL  Delusions None reported or observed  Perception WDL  Hallucination None reported or observed  Judgment Limited  Confusion Mild  Danger to Self  Current suicidal ideation? Denies  Danger to Others  Danger to Others None reported or observed

## 2023-08-24 NOTE — Progress Notes (Signed)
   08/23/23 2020  Psych Admission Type (Psych Patients Only)  Admission Status Voluntary  Psychosocial Assessment  Patient Complaints Anxiety  Eye Contact Fair  Facial Expression Animated  Affect Appropriate to circumstance  Speech Logical/coherent  Interaction Assertive  Motor Activity Other (Comment) (wheelchair)  Appearance/Hygiene In scrubs  Behavior Characteristics Appropriate to situation;Cooperative  Mood Pleasant  Thought Process  Coherency WDL  Content WDL  Delusions None reported or observed  Perception WDL  Hallucination None reported or observed  Judgment Impaired  Confusion Mild  Danger to Self  Current suicidal ideation? Denies  Danger to Others  Danger to Others None reported or observed

## 2023-08-24 NOTE — Group Note (Signed)
 Date:  08/24/2023 Time:  11:17 AM  Group Topic/Focus:  Coping With Mental Health Crisis:   The purpose of this group is to help patients identify strategies for coping with mental health crisis.  Group discusses possible causes of crisis and ways to manage them effectively.    Participation Level:  Active  Participation Quality:  Appropriate  Affect:  Appropriate  Cognitive:  Alert  Insight: Appropriate  Engagement in Group:  Engaged  Modes of Intervention:  Education  Additional Comments:  N/A  [name] 08/24/2023, 11:17 AM

## 2023-08-25 DIAGNOSIS — F259 Schizoaffective disorder, unspecified: Secondary | ICD-10-CM | POA: Diagnosis not present

## 2023-08-25 LAB — CBC WITH DIFFERENTIAL/PLATELET
Abs Immature Granulocytes: 0.04 K/uL (ref 0.00–0.07)
Basophils Absolute: 0.1 K/uL (ref 0.0–0.1)
Basophils Relative: 1 %
Eosinophils Absolute: 0.2 K/uL (ref 0.0–0.5)
Eosinophils Relative: 2 %
HCT: 27.5 % — ABNORMAL LOW (ref 36.0–46.0)
Hemoglobin: 8.6 g/dL — ABNORMAL LOW (ref 12.0–15.0)
Immature Granulocytes: 1 %
Lymphocytes Relative: 16 %
Lymphs Abs: 1.3 K/uL (ref 0.7–4.0)
MCH: 29 pg (ref 26.0–34.0)
MCHC: 31.3 g/dL (ref 30.0–36.0)
MCV: 92.6 fL (ref 80.0–100.0)
Monocytes Absolute: 0.5 K/uL (ref 0.1–1.0)
Monocytes Relative: 6 %
Neutro Abs: 6.1 K/uL (ref 1.7–7.7)
Neutrophils Relative %: 74 %
Platelets: 306 K/uL (ref 150–400)
RBC: 2.97 MIL/uL — ABNORMAL LOW (ref 3.87–5.11)
RDW: 16.6 % — ABNORMAL HIGH (ref 11.5–15.5)
WBC: 8.2 K/uL (ref 4.0–10.5)
nRBC: 0 % (ref 0.0–0.2)

## 2023-08-25 LAB — HEPATITIS B SURFACE ANTIGEN: Hepatitis B Surface Ag: NONREACTIVE

## 2023-08-25 LAB — RENAL FUNCTION PANEL
Albumin: 3.7 g/dL (ref 3.5–5.0)
Anion gap: 12 (ref 5–15)
BUN: 65 mg/dL — ABNORMAL HIGH (ref 8–23)
CO2: 25 mmol/L (ref 22–32)
Calcium: 9.5 mg/dL (ref 8.9–10.3)
Chloride: 93 mmol/L — ABNORMAL LOW (ref 98–111)
Creatinine, Ser: 3.68 mg/dL — ABNORMAL HIGH (ref 0.44–1.00)
GFR, Estimated: 13 mL/min — ABNORMAL LOW (ref >60)
Glucose, Bld: 85 mg/dL (ref 70–99)
Phosphorus: 4.7 mg/dL — ABNORMAL HIGH (ref 2.5–4.6)
Potassium: 3 mmol/L — ABNORMAL LOW (ref 3.5–5.1)
Sodium: 130 mmol/L — ABNORMAL LOW (ref 135–145)

## 2023-08-25 MED ORDER — HEPARIN SODIUM (PORCINE) 1000 UNIT/ML IJ SOLN
INTRAMUSCULAR | Status: AC
  Filled 2023-08-25: qty 4

## 2023-08-25 MED ORDER — PALIPERIDONE PALMITATE ER 234 MG/1.5ML IM SUSY: 234.0000 mg | PREFILLED_SYRINGE | Freq: Once | INTRAMUSCULAR | Status: DC

## 2023-08-25 MED ORDER — PALIPERIDONE PALMITATE ER 234 MG/1.5ML IM SUSY
234.0000 mg | PREFILLED_SYRINGE | Freq: Once | INTRAMUSCULAR | Status: AC
  Administered 2023-08-26: 234 mg via INTRAMUSCULAR
  Filled 2023-08-25: qty 1.5

## 2023-08-25 NOTE — Progress Notes (Signed)
 Patient was on treatment and she became very agitated. Requesting to come off treatment her hand was cramping and felt bad in general. Her primary dialysis nurse was at lunch. This nurse is day 3 of orientation and not signed off to stop HD treatment at this time. Spoke with CN about assisting her to come off treatment, Cn spoke with her attempting to get her to complete her dialysis treatment. She stated if you do not take me off I will pull out my catheter. She also became upset and stated that staff called her crazy, there was no mention from staff about her MH status. Cn and DT took patient off treatment. She did not allow for all of her blood to be rinsed back. She refused to allow her CVC dressing to be changed, it was due to be changed today, she also did not receive her Epo due to early termination of treatment. Attempted to get AMA signed she refused this as well. Her nurse on unit was called and she came to dialysis to escort her back to unit.

## 2023-08-25 NOTE — Group Note (Signed)
 [facility] LCSW Group Therapy Note   Group Date: 08/25/2023 Start Time: 1400 End Time: 1445   Type of Therapy/Topic:  Group Therapy:  Emotion Regulation  Participation Level:  Did Not Attend   Mood:  Description of Group:    The purpose of this group is to assist patients in learning to regulate negative emotions and experience positive emotions. Patients will be guided to discuss ways in which they have been vulnerable to their negative emotions. These vulnerabilities will be juxtaposed with experiences of positive emotions or situations, and patients challenged to use positive emotions to combat negative ones. Special emphasis will be placed on coping with negative emotions in conflict situations, and patients will process healthy conflict resolution skills.  Therapeutic Goals: Patient will identify two positive emotions or experiences to reflect on in order to balance out negative emotions:  Patient will label two or more emotions that they find the most difficult to experience:  Patient will be able to demonstrate positive conflict resolution skills through discussion or role plays:   Summary of Patient Progress:   Patient did not attend group.    Therapeutic Modalities:   Cognitive Behavioral Therapy Feelings Identification Dialectical Behavioral Therapy   Aldo HERO Desaray Marschner, LCSW

## 2023-08-25 NOTE — Plan of Care (Signed)
  Problem: Coping: Goal: Coping ability will improve Outcome: Progressing   Problem: Role Relationship: Goal: Ability to interact with others will improve Outcome: Progressing

## 2023-08-25 NOTE — Progress Notes (Signed)
   08/25/23 0830  Psych Admission Type (Psych Patients Only)  Admission Status Voluntary  Psychosocial Assessment  Patient Complaints Depression  Eye Contact Fair  Facial Expression Animated  Affect Appropriate to circumstance  Speech Logical/coherent  Interaction Assertive  Motor Activity Other (Comment) (wheelchair)  Appearance/Hygiene Unremarkable;In scrubs  Behavior Characteristics Appropriate to situation  Mood Pleasant  Thought Process  Coherency WDL  Content WDL  Delusions None reported or observed  Perception WDL  Hallucination None reported or observed  Judgment Limited  Confusion Mild  Danger to Self  Current suicidal ideation? Denies  Danger to Others  Danger to Others None reported or observed

## 2023-08-25 NOTE — Progress Notes (Addendum)
  Received patient in bed to unit.   Informed consent signed and in chart.    TX duration:1hr 57 min  pt refused to sign AMA.  2 witnesses signed.  See nursing note     Transported back to floor  Hand-off given to patient's nurse.    Access used: R HD Catheter  Dressing due but unable to change f/t pt adament that she come off machine early, yelling at staff.   Access issues: none   Total UF removed: 0.6L Medication(s) given: none Pt did not receive retacrit .  See note  Post HD VS: wnl Post HD weight: 85.9kg  MD made aware of above      Olivia Hurst LPN [facility]

## 2023-08-25 NOTE — Group Note (Signed)
 Date:  08/25/2023 Time:  9:02 PM  Group Topic/Focus:  Self Care:   The focus of this group is to help patients understand the importance of self-care in order to improve or restore emotional, physical, spiritual, interpersonal, and financial health.    Participation Level:  Active  Participation Quality:  Appropriate  Affect:  Appropriate  Cognitive:  Appropriate  Insight: Good  Engagement in Group:  Engaged  Modes of Intervention:  Discussion  Additional Comments:    Tiffany [name] 08/25/2023, 9:02 PM

## 2023-08-25 NOTE — Progress Notes (Signed)
 Patient scheduled for dialysis today. Will attempt UF 0.5-1L as tolerated. Will continue TTS schedule during this admission.
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
## 2023-08-25 NOTE — Group Note (Signed)
 Date:  08/25/2023 Time:  12:38 PM  Group Topic/Focus:  Goals Group:   The focus of this group is to help patients establish daily goals to achieve during treatment and discuss how the patient can incorporate goal setting into their daily lives to aide in recovery.    Participation Level:  Active  Participation Quality:  Appropriate, Attentive, Sharing, and Supportive  Affect:  Appropriate  Cognitive:  Appropriate  Insight: Good  Engagement in Group:  Engaged and Supportive  Modes of Intervention:  Discussion, Socialization, and Support  Additional Comments:  n/a  Tiffany [name] 08/25/2023, 12:38 PM

## 2023-08-26 DIAGNOSIS — F259 Schizoaffective disorder, unspecified: Secondary | ICD-10-CM | POA: Diagnosis not present

## 2023-08-26 NOTE — Progress Notes (Signed)
   08/26/23 0830  Psych Admission Type (Psych Patients Only)  Admission Status Voluntary  Psychosocial Assessment  Patient Complaints Depression  Eye Contact Fair  Facial Expression Animated  Affect Appropriate to circumstance  Speech Logical/coherent  Interaction Assertive  Motor Activity Other (Comment) (wheelchair)  Appearance/Hygiene Unremarkable;In scrubs  Behavior Characteristics Appropriate to situation  Mood Pleasant  Thought Process  Coherency WDL  Content WDL  Delusions None reported or observed  Perception WDL  Hallucination None reported or observed  Judgment Limited  Confusion Mild  Danger to Self  Current suicidal ideation? Denies  Danger to Others  Danger to Others None reported or observed
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
## 2023-08-26 NOTE — Progress Notes (Signed)
   08/25/23 2100  Psych Admission Type (Psych Patients Only)  Admission Status Voluntary  Psychosocial Assessment  Patient Complaints Irritability  Eye Contact Fair  Facial Expression Angry  Affect Irritable  Speech Logical/coherent  Interaction Assertive  Motor Activity Other (Comment) (wheelchair)  Appearance/Hygiene Unremarkable  Behavior Characteristics Irritable  Mood Irritable  Thought Process  Coherency WDL  Content WDL  Delusions None reported or observed  Perception WDL  Hallucination None reported or observed  Judgment Impaired  Confusion WDL  Danger to Self  Current suicidal ideation? Denies

## 2023-08-26 NOTE — Plan of Care (Signed)
  Problem: Activity: Goal: Will verbalize the importance of balancing activity with adequate rest periods Outcome: Progressing   Problem: Coping: Goal: Coping ability will improve Outcome: Progressing Goal: Will verbalize feelings Outcome: Progressing   

## 2023-08-26 NOTE — Plan of Care (Signed)
  Problem: Activity: Goal: Will verbalize the importance of balancing activity with adequate rest periods Outcome: Progressing   Problem: Education: Goal: Will be free of psychotic symptoms Outcome: Progressing Goal: Knowledge of the prescribed therapeutic regimen will improve Outcome: Progressing   Problem: Coping: Goal: Coping ability will improve Outcome: Progressing Goal: Will verbalize feelings Outcome: Progressing   Problem: Health Behavior/Discharge Planning: Goal: Compliance with prescribed medication regimen will improve Outcome: Progressing   

## 2023-08-26 NOTE — Group Note (Signed)
 Date:  08/26/2023 Time:  6:22 PM  Group Topic/Focus:  Developing a Wellness Toolbox:   The focus of this group is to help patients develop a wellness toolbox with skills and strategies to promote recovery upon discharge.    Participation Level:  Did Not Attend

## 2023-08-26 NOTE — Plan of Care (Signed)
  Problem: Activity: Goal: Will verbalize the importance of balancing activity with adequate rest periods 08/26/2023 1658 by Zachary Titus BIRCH, RN Outcome: Progressing 08/26/2023 1606 by Zachary Titus BIRCH, RN Outcome: Progressing   Problem: Coping: Goal: Coping ability will improve 08/26/2023 1658 by Zachary Titus BIRCH, RN Outcome: Progressing 08/26/2023 1606 by Zachary Titus BIRCH, RN Outcome: Progressing Goal: Will verbalize feelings Outcome: Progressing

## 2023-08-27 DIAGNOSIS — F259 Schizoaffective disorder, unspecified: Secondary | ICD-10-CM | POA: Diagnosis not present

## 2023-08-27 NOTE — Group Note (Signed)
 Date:  08/27/2023 Time:  12:46 PM  Group Topic/Focus:  Wellness Toolbox:   The focus of this group is to discuss various aspects of wellness, balancing those aspects and exploring ways to increase the ability to experience wellness.  Patients will create a wellness toolbox for use upon discharge.    Participation Level:  Did Not Attend  Participation Quality:    Affect:    Cognitive:    Insight:   Engagement in Group:    Modes of Intervention:    Additional Comments:    Jonnae Fonseca 08/27/2023, 12:46 PM

## 2023-08-27 NOTE — Progress Notes (Signed)
 Calm and cooperative. Visible in dayroom. Interacting with peers and staff. Attends group. Endorses anxiety 10/10. Pt states she doesn't want to go back home says she needs a new environment and a change.  Pt said I don't need be around a toxic environment! Pt discussed her concerns with the ACT team and monthly injections. Encouragement and support provided, allowing the pt to express her fears and anxieties. No behavior issues noted. Denies SI/HI/AVH. No c/o pain/discomfort noted.    08/26/23 2100  Psych Admission Type (Psych Patients Only)  Admission Status Voluntary  Psychosocial Assessment  Patient Complaints Anxiety;Depression  Eye Contact Fair  Facial Expression Animated  Affect Appropriate to circumstance  Speech Logical/coherent  Interaction Assertive  Motor Activity Other (Comment) (wheelchair)  Appearance/Hygiene In scrubs  Behavior Characteristics Appropriate to situation  Mood Pleasant  Thought Process  Coherency WDL  Content WDL  Delusions None reported or observed  Perception WDL  Hallucination None reported or observed  Judgment Limited  Confusion Mild  Danger to Self  Current suicidal ideation? Denies

## 2023-08-27 NOTE — Group Note (Signed)
 Date:  08/27/2023 Time:  11:17 PM  Group Topic/Focus:  Wrap-Up Group:   The focus of this group is to help patients review their daily goal of treatment and discuss progress on daily workbooks.    Participation Level:  Active  Participation Quality:  Appropriate  Affect:  Appropriate  Cognitive:  Alert  Insight: Appropriate  Engagement in Group:  Engaged  Modes of Intervention:  Discussion  Additional Comments:    Tiffany [name] CHRISTELLA Bunker 08/27/2023, 11:17 PM

## 2023-08-27 NOTE — Progress Notes (Signed)
   08/27/23 0612  15 Minute Checks  Location Bedroom  Visual Appearance Calm  Behavior Sleeping  Sleep (Behavioral Health Patients Only)  Calculate sleep? (Click Yes once per 24 hr at 0600 safety check) Yes  Documented sleep last 24 hours 7.25

## 2023-08-27 NOTE — Progress Notes (Signed)
 This RN was informed a stitch was missing from pt's dialysis port.  NP Faith Harris was contacted.  This Clinical research associate was told if there is no bleeding, there is no concern.  The port is secured internally.   Dr. Jadapalle also made aware.

## 2023-08-27 NOTE — Plan of Care (Signed)
   Problem: Activity: Goal: Will verbalize the importance of balancing activity with adequate rest periods Outcome: Progressing   Problem: Education: Goal: Will be free of psychotic symptoms Outcome: Progressing Goal: Knowledge of the prescribed therapeutic regimen will improve Outcome: Progressing   Problem: Coping: Goal: Coping ability will improve Outcome: Progressing Goal: Will verbalize feelings Outcome: Progressing

## 2023-08-27 NOTE — Progress Notes (Signed)
 [facility] MD Progress Note  08/27/2023 11:31 PM Tiffany [name]  MRN:  [id number]  ill M Muraski is a 63 y.o. female admitted: Presented to the [facility] 08/13/2023  7:48 PM for after presenting due to a fall later patient exhibit extreme paranoia. She carries the psychiatric diagnoses of PTSD, MDD with and without psychotic features, schizoaffective disorder, and history of polysubstance use and has a past medical history of hypertension, ESRD with HD, congestive heart failure, diabetes, COPD, , stroke. Her current presentation of paranoia, delusional thoughts, auditory and visual hallucinations is most consistent with schizoaffective disorder. She meets criteria for inpatient psychiatric admission based on current symptom Patient is admitted to [facility] unit with Q15 min safety monitoring. Multidisciplinary team approach is offered. Medication management; group/milieu therapy is offered.    Subjective: Chart is reviewed and discussed the case with treatment team.  Patient is noted to be resting in bed.  She reports feeling very tired and unable to sit up.  She did acknowledge that she had her breakfast today.  She denies SI/HI/plan.  She denies hallucinations.  Provider discussed that her oral medication Invega  will be reduced to minimize her sedation as she received Invega  Sustenna LAI. Sleep: Fair  Appetite:  Fair  Past Psychiatric History: see h&P Family History:  Family History  Problem Relation Age of Onset   Cancer Mother        lung   Depression Mother    Cancer Father        prostate   Depression Sister    Anxiety disorder Sister    Schizophrenia Sister    Bipolar disorder Sister    Depression Sister    Depression Brother    Heart failure Other        cousin   Social History:  Social History   Substance and Sexual Activity  Alcohol Use No   Alcohol/week: 0.0 standard drinks of alcohol     Social History   Substance and Sexual Activity  Drug Use Not Currently   Types:  Cocaine   Comment: 04/10/2016 last used cocaine back in November 2017    Social History   Socioeconomic History   Marital status: Widowed    Spouse name: Not on file   Number of children: 3   Years of education: 12th   Highest education level: 12th grade  Occupational History   Occupation: disabled    Comment: factory production  Tobacco Use   Smoking status: Former    Current packs/day: 0.00    Average packs/day: 1.5 packs/day for 39.1 years (58.6 ttl pk-yrs)    Types: Cigarettes    Start date: 03/13/1977    Quit date: 04/10/2016    Years since quitting: 7.3    Passive exposure: Past   Smokeless tobacco: Never  Vaping Use   Vaping status: Never Used  Substance and Sexual Activity   Alcohol use: No    Alcohol/week: 0.0 standard drinks of alcohol   Drug use: Not Currently    Types: Cocaine    Comment: 04/10/2016 last used cocaine back in November 2017   Sexual activity: Not Currently    Birth control/protection: Surgical  Other Topics Concern   Not on file  Social History Narrative   Has 1 son, [name]   Lives with son and his boyfriend   Her house has ramps and handrails should she ever needs them.    Her mother lives down the street from her and is a good support person in addition  to her son.   She drives herself, has private transportation.    Cocaine free since 02/24/16, smoke free since 04/10/16   Dialysis M-W-F   Social Drivers of Health   Financial Resource Strain: Low Risk  (07/11/2022)   Overall Financial Resource Strain (CARDIA)    Difficulty of Paying Living Expenses: Not very hard  Food Insecurity: Food Insecurity Present (08/15/2023)   Hunger Vital Sign    Worried About Running Out of Food in the Last Year: Sometimes true    Ran Out of Food in the Last Year: Sometimes true  Transportation Needs: No Transportation Needs (08/15/2023)   PRAPARE - Administrator, Civil Service (Medical): No    Lack of Transportation (Non-Medical): No  Physical  Activity: Inactive (07/11/2022)   Exercise Vital Sign    Days of Exercise per Week: 0 days    Minutes of Exercise per Session: 0 min  Stress: Stress Concern Present (07/11/2022)   [employer] of Occupational Health - Occupational Stress Questionnaire    Feeling of Stress : To some extent  Social Connections: Socially Isolated (06/28/2023)   Social Connection and Isolation Panel    Frequency of Communication with Friends and Family: Three times a week    Frequency of Social Gatherings with Friends and Family: Three times a week    Attends Religious Services: Never    Active Member of Clubs or Organizations: No    Attends Banker Meetings: Never    Marital Status: Widowed   Past Medical History:  Past Medical History:  Diagnosis Date   Abdominal pain    Acute encephalopathy 11/21/2017   Acute exacerbation of CHF (congestive heart failure) (HCC) 03/03/2022   Acute GI bleeding 03/29/2021   Acute metabolic encephalopathy 02/20/2020   Aggressive behavior    Agitation 11/22/2017   Anoxic brain injury (HCC) 09/08/2016   C. Arrest due to respiratory failure and COPD exacerbation   Anxiety    Arthritis    all over (04/10/2016)   Asthma 10/18/2010   Binge eating disorder    Blood loss anemia 04/24/2011   CAP (community acquired pneumonia) 06/22/2015   Cardiac arrest (HCC) 09/08/2016   PEA   Carotid artery stenosis    1-39% bilateral by dopplers 11/2016   Chronic diastolic (congestive) heart failure (HCC)    Chronic pain syndrome 06/18/2012   Chronic post-traumatic stress disorder (PTSD) 05/27/2018   Chronic respiratory failure with hypoxia and hypercapnia (HCC) 06/22/2015   TRILOGY Vent >AVAPA-ES., Vt target 200-400, Max P 30 , PS max 20 , PS min 6-10 , E Max 6, E Min 4, Rate Auto AVAPS Rate 2 (titrate for pt comfort) , bleed O2 at 5l/m continuous flow .    Closed displaced fracture of fifth metacarpal bone 03/21/2018   Cocaine use disorder, severe, in sustained  remission (HCC) 12/17/2015   Complication of anesthesia    decreased bp, decreased heart rate   COPD (chronic obstructive pulmonary disease) (HCC) 07/08/2014   Delusional disorder, persecutory type (HCC) 06/26/2021   Depression    Diabetic neuropathy (HCC) 04/24/2011   Difficulty with speech 01/24/2018   Disorder of nervous system    Drug abuse (HCC) 11/21/2017   Dyslipidemia 04/24/2011   E. coli UTI 02/20/2020   Elevated troponin 04/28/2012   Emphysema    Encephalopathy 11/21/2017   Essential hypertension 03/22/2016   Facet arthropathy, lumbar 06/12/2022   Fibula fracture 07/10/2016   Frequent falls 10/11/2017   GERD (gastroesophageal reflux disease)  GI bleed 03/30/2021   Gout 04/11/2017   Heart attack (HCC) 1980s   Hematochezia    History of blood transfusion 1994   couldn't stop bleeding from my period   History of drug abuse in remission (HCC) 11/28/2015   Quit in 2017   Hyperlipidemia 03/31/2021   Hyperlipidemia LDL goal <70    Hypocalcemia    Hypokalemia    Hypomagnesemia    Incontinence    Manic depression (HCC)    Morbid obesity (HCC) 10/18/2010   Obstructive sleep apnea 10/18/2010   On home oxygen  therapy    6L; 24/7 (04/10/2016)   OSA on CPAP    wear mask sometimes (04/10/2016)   Painless rectal bleeding 12/31/2015   Paranoid (HCC)    sometimes; I'm on RX for it (04/10/2016)   Periumbilical abdominal pain    Prolonged Q-T interval on ECG    Rectal bleeding 12/31/2015   Recurrent syncope 07/09/2021   Rhabdomyolysis 06/16/2021   Schizoaffective disorder, bipolar type (HCC) 04/05/2018   Seasonal allergies    Seborrheic keratoses 12/31/2013   Seizures (HCC)    don't know what kind; last one was ~ 1 yr ago (04/10/2016)   Sinus bradycardia    Skin ulcer of sacrum, limited to breakdown of skin (HCC) 03/08/2022   Spondylolisthesis at L3-L4 level 06/12/2022   Stroke (HCC) 1980s   denies residual on 04/10/2016   Thrush 09/19/2013   Type 2 diabetes  mellitus (HCC) 10/18/2010    Past Surgical History:  Procedure Laterality Date   A/V FISTULAGRAM N/A 02/01/2023   Procedure: A/V Fistulagram;  Surgeon: Norine Manuelita LABOR, MD;  Location: [facility] INVASIVE CV LAB;  Service: Cardiovascular;  Laterality: N/A;   AV FISTULA PLACEMENT Left 12/21/2022   Procedure: INSERTION OF LEFT ARM ARTERIOVENOUS (AV) GORE-TEX GRAFT;  Surgeon: Magda Debby SAILOR, MD;  Location: [facility] OR;  Service: Vascular;  Laterality: Left;   CESAREAN SECTION  1997   COLONOSCOPY WITH PROPOFOL  N/A 04/01/2021   Procedure: COLONOSCOPY WITH PROPOFOL ;  Surgeon: Rollin Dover, MD;  Location: [facility] ENDOSCOPY;  Service: Gastroenterology;  Laterality: N/A;  Rectal bleeding with drop in hemoglobin to 7.2 g/dL   HERNIA REPAIR     IR CHOLANGIOGRAM EXISTING TUBE  07/20/2016   IR PERC CHOLECYSTOSTOMY  05/10/2016   IR RADIOLOGIST EVAL & MGMT  06/08/2016   IR RADIOLOGIST EVAL & MGMT  06/29/2016   IR SINUS/FIST TUBE CHK-NON GI  07/12/2016   RIGHT/LEFT HEART CATH AND CORONARY ANGIOGRAPHY N/A 06/19/2017   Procedure: RIGHT/LEFT HEART CATH AND CORONARY ANGIOGRAPHY;  Surgeon: Cherrie Toribio SAUNDERS, MD;  Location: [facility] INVASIVE CV LAB;  Service: Cardiovascular;  Laterality: N/A;   TIBIA IM NAIL INSERTION Right 07/12/2016   Procedure: INTRAMEDULLARY (IM) NAIL RIGHT TIBIA;  Surgeon: Jerri Kay HERO, MD;  Location: [facility] OR;  Service: Orthopedics;  Laterality: Right;   UMBILICAL HERNIA REPAIR  ~ 1963   that's why I don't have a belly button   VAGINAL HYSTERECTOMY      Current Medications: Current Facility-Administered Medications  Medication Dose Route Frequency Provider Last Rate Last Admin   albuterol  (VENTOLIN  HFA) 108 (90 Base) MCG/ACT inhaler 2 puff  2 puff Inhalation Q6H PRN Mannie Jerel PARAS, NP   2 puff at 08/27/23 2157   alteplase  (CATHFLO ACTIVASE ) injection 2 mg  2 mg Intracatheter Once PRN Druscilla Bald, NP       amLODipine  (NORVASC ) tablet 5 mg  5 mg Oral BID Mannie Jerel PARAS, NP   5 mg at 08/27/23 2156  atorvastatin  (LIPITOR ) tablet 20 mg  20 mg Oral Daily Mannie Jerel PARAS, NP   20 mg at 08/27/23 0911   calcitRIOL  (ROCALTROL ) capsule 0.5 mcg  0.5 mcg Oral Daily Theran Vandergrift, MD   0.5 mcg at 08/27/23 0912   capsaicin  (ZOSTRIX) 0.025 % cream   Topical BID PRN Onuoha, Chinwendu V, NP   Given at 08/27/23 2157   carvedilol  (COREG ) tablet 12.5 mg  12.5 mg Oral BID Mannie Jerel PARAS, NP   12.5 mg at 08/27/23 2156   Chlorhexidine  Gluconate Cloth 2 % PADS 6 each  6 each Topical Q0600 Druscilla Bald, NP   6 each at 08/25/23 0857   dicyclomine  (BENTYL ) capsule 10 mg  10 mg Oral TID Mannie Jerel PARAS, NP   10 mg at 08/27/23 2156   docusate sodium  (COLACE) capsule 100 mg  100 mg Oral Daily Adah Stoneberg, MD   100 mg at 08/27/23 9088   epoetin  alfa-epbx (RETACRIT ) injection 10,000 Units  10,000 Units Intravenous Q T,Th,Sat-1800 Breeze, Bald, NP   10,000 Units at 08/21/23 1154   escitalopram  (LEXAPRO ) tablet 5 mg  5 mg Oral Daily Mannie Jerel PARAS, NP   5 mg at 08/27/23 9088   ezetimibe  (ZETIA ) tablet 10 mg  10 mg Oral Daily Lagena Strand, MD   10 mg at 08/27/23 9087   ferrous sulfate  tablet 325 mg  325 mg Oral Daily Maya Scholer, MD   325 mg at 08/27/23 9088   folic acid  (FOLVITE ) tablet 1 mg  1 mg Oral Daily Zephaniah Lubrano, MD   1 mg at 08/27/23 9088   gabapentin  (NEURONTIN ) capsule 100 mg  100 mg Oral QHS Mannie Jerel PARAS, NP   100 mg at 08/27/23 2156   heparin  injection 1,000 Units  1,000 Units Intracatheter PRN Druscilla Bald, NP       hydrOXYzine  (ATARAX ) tablet 10 mg  10 mg Oral TID PRN Mannie Jerel PARAS, NP   10 mg at 08/20/23 2112   melatonin tablet 5 mg  5 mg Oral QHS Onuoha, Chinwendu V, NP   5 mg at 08/27/23 2156   metolazone  (ZAROXOLYN ) tablet 2.5 mg  2.5 mg Oral Q M,W,F Jlynn Langille, MD   2.5 mg at 08/27/23 9087   mirtazapine  (REMERON ) tablet 15 mg  15 mg Oral QHS Christopherjame Carnell, MD   15 mg at 08/27/23 2156   OLANZapine  (ZYPREXA ) injection 5 mg  5 mg Intramuscular TID PRN  Mannie Jerel PARAS, NP       OLANZapine  zydis (ZYPREXA ) disintegrating tablet 5 mg  5 mg Oral TID PRN Mannie Jerel PARAS, NP   5 mg at 08/18/23 2318   pantoprazole  (PROTONIX ) EC tablet 40 mg  40 mg Oral Daily Arleigh Dicola, MD   40 mg at 08/27/23 0911   torsemide  (DEMADEX ) tablet 40 mg  40 mg Oral BID Mannie Jerel PARAS, NP   40 mg at 08/27/23 1743    Lab Results:  No results found. However, due to the size of the patient record, not all encounters were searched. Please check Results Review for a complete set of results.    Blood Alcohol level:  Lab Results  Component Value Date   [facility] <15 05/31/2023   ETH <10 03/10/2023    Metabolic Disorder Labs: Lab Results  Component Value Date   HGBA1C 5.0 01/05/2022   MPG 99.67 06/17/2021   MPG 105.41 07/23/2020   No results found for: PROLACTIN Lab Results  Component Value Date   CHOL 151 11/14/2022  TRIG 100 11/14/2022   HDL 65 11/14/2022   CHOLHDL 2.3 11/14/2022   VLDL 20 11/14/2022   LDLCALC 66 11/14/2022   LDLCALC 54 08/05/2018    Physical Findings: AIMS:  , ,  ,  ,    CIWA:    COWS:      Psychiatric Specialty Exam:  Presentation  General Appearance:  Appropriate for Environment; Casual  Eye Contact: Fair  Speech: Clear and Coherent  Speech Volume: Normal    Mood and Affect  Mood: Normal Affect: Euthymic  Thought Process  Thought Processes: Disorganized But improving Descriptions of Associations:Intact  Orientation:Partial  Thought Content:Paranoid Ideation  Hallucinations: Auditory hallucinations  Ideas of Reference:Paranoia; Delusions  Suicidal Thoughts: Denies  Homicidal Thoughts: Denies   Sensorium  Memory: Immediate Fair; Recent Fair; Remote Poor  Judgment: Impaired  Insight: Shallow        Physical Exam: Physical Exam Vitals and nursing note reviewed.    ROS Blood pressure 129/83, pulse 80, temperature 98.2 F (36.8 C), resp. rate 14, weight 86.1 kg, SpO2 100%.  Body mass index is 34.72 kg/m.  Diagnosis: Principal Problem:   Schizoaffective disorder [facility])   Clinical Decision Making: Patient with history of Schizoaffective disorder Bipolar type, end-stage renal disease admitted for worsening paranoia and delusions about people abusing her, out to get her.  Her paranoia worsened to the point where she refused to leave the house and skipped her dialysis for 5 days which led up to medical emergency.  Patient needs inpatient psychiatric hospitalization for further stabilization.   Treatment Plan Summary:   Safety and Monitoring:             -- Voluntary admission to inpatient psychiatric unit for safety, stabilization and treatment             -- Daily contact with patient to assess and evaluate symptoms and progress in treatment             -- Patient's case to be discussed in multi-disciplinary team meeting             -- Observation Level: q15 minute checks             -- Vital signs:  q12 hours             -- Precautions: suicide, elopement, and assault   2. Psychiatric Diagnoses and Treatment:               Discontinued Zyprexa .    Invega  LAI 08/26/2023 Invega  9 mg daily Lexapro  5 mg daily Prazosin    Patient has chronic delusions but exacerbated to the extent of not going to dialysis-   Will consider adding saphris  which is her home meds   -- The risks/benefits/side-effects/alternatives to this medication were discussed in detail with the patient and time was given for questions. The patient consents to medication trial.                -- Metabolic profile and EKG monitoring obtained while on an atypical antipsychotic (BMI: Lipid Panel: HbgA1c: QTc:)              -- Encouraged patient to participate in unit milieu and in scheduled group therapies                            3. Medical Issues Being Addressed:   ESRD: consulted nephrology for dialysis 4. Discharge Planning:   -- Social work and case management to  assist with discharge  planning and identification of hospital follow-up needs prior to discharge  -- Estimated LOS: 3-4 days  Ventura Hollenbeck, MD 08/27/2023, 11:31 PM

## 2023-08-27 NOTE — Plan of Care (Signed)
   Problem: Activity: Goal: Will verbalize the importance of balancing activity with adequate rest periods Outcome: Progressing   Problem: Education: Goal: Will be free of psychotic symptoms Outcome: Progressing Goal: Knowledge of the prescribed therapeutic regimen will improve Outcome: Progressing   Problem: Coping: Goal: Coping ability will improve Outcome: Progressing Goal: Will verbalize feelings Outcome: Progressing   Problem: Health Behavior/Discharge Planning: Goal: Compliance with prescribed medication regimen will improve Outcome: Progressing   Problem: Nutritional: Goal: Ability to achieve adequate nutritional intake will improve Outcome: Progressing   Problem: Role Relationship: Goal: Ability to communicate needs accurately will improve Outcome: Progressing Goal: Ability to interact with others will improve Outcome: Progressing   Problem: Safety: Goal: Ability to redirect hostility and anger into socially appropriate behaviors will improve Outcome: Progressing Goal: Ability to remain free from injury will improve Outcome: Progressing   Problem: Self-Care: Goal: Ability to participate in self-care as condition permits will improve Outcome: Progressing   Problem: Self-Concept: Goal: Will verbalize positive feelings about self Outcome: Progressing   Problem: Education: Goal: Utilization of techniques to improve thought processes will improve Outcome: Progressing Goal: Knowledge of the prescribed therapeutic regimen will improve Outcome: Progressing

## 2023-08-27 NOTE — Plan of Care (Signed)
  Problem: Education: Goal: Knowledge of the prescribed therapeutic regimen will improve Outcome: Progressing   Problem: Coping: Goal: Coping ability will improve Outcome: Progressing   Problem: Safety: Goal: Ability to remain free from injury will improve Outcome: Progressing

## 2023-08-27 NOTE — Progress Notes (Signed)
 Patient is pleasant.  Endorses feeling sleepy.  Dr. JINNY made aware and stated it is likely due to the LAI she received yesterday.  Denis SI/HI and AVH.  Denies pain.  Compliant with scheduled medications. 15 min checks in place for safety. Patient has isolated to her room (sleeping) until this evening.  Appropriate interaction with peers and staff while in milieu.
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
## 2023-08-27 NOTE — Group Note (Signed)
 Recreation Therapy Group Note   Group Topic:Coping Skills  Group Date: 08/27/2023 Start Time: 1350 End Time: 1450 Facilitators: Celestia Jeoffrey BRAVO, LRT, CTRS Location: Courtyard  Group Description: Music. Patients encouraged to name their favorite song(s) for LRT to play song through speaker for group to hear, while in the courtyard getting fresh air and sunlight. Patients educated on the definition of leisure and the importance of having different leisure interests outside of the hospital. Group discussed how leisure activities can often be used as Pharmacologist and that listening to music and being outside are examples.    Goal Area(s) Addressed:  Patient will identify a current leisure interest.  Patient will practice making a positive decision. Patient will have the opportunity to try a new leisure activity.   Affect/Mood: N/A   Participation Level: Did not attend    Clinical Observations/Individualized Feedback: Patient did not attend group.   Plan: Continue to engage patient in RT group sessions 2-3x/week.   Jeoffrey BRAVO Celestia, LRT, CTRS 08/27/2023 5:08 PM

## 2023-08-27 NOTE — Group Note (Signed)
 Date:  08/27/2023 Time:  9:07 PM  Group Topic/Focus:  Wrap-Up Group:   The focus of this group is to help patients review their daily goal of treatment and discuss progress on daily workbooks.    Participation Level:  Active  Participation Quality:  Appropriate  Affect:  Appropriate  Cognitive:  Alert  Insight: Appropriate  Engagement in Group:  Engaged  Modes of Intervention:  Discussion  Additional Comments:    Tommas CHRISTELLA Bunker 08/27/2023, 9:07 PM

## 2023-08-28 DIAGNOSIS — F259 Schizoaffective disorder, unspecified: Secondary | ICD-10-CM | POA: Diagnosis not present

## 2023-08-28 LAB — RENAL FUNCTION PANEL
Albumin: 3.1 g/dL — ABNORMAL LOW (ref 3.5–5.0)
Anion gap: 16 — ABNORMAL HIGH (ref 5–15)
BUN: 74 mg/dL — ABNORMAL HIGH (ref 8–23)
CO2: 23 mmol/L (ref 22–32)
Calcium: 9.2 mg/dL (ref 8.9–10.3)
Chloride: 90 mmol/L — ABNORMAL LOW (ref 98–111)
Creatinine, Ser: 4.45 mg/dL — ABNORMAL HIGH (ref 0.44–1.00)
GFR, Estimated: 10 mL/min — ABNORMAL LOW (ref >60)
Glucose, Bld: 99 mg/dL (ref 70–99)
Phosphorus: 5.2 mg/dL — ABNORMAL HIGH (ref 2.5–4.6)
Potassium: 2.6 mmol/L — CL (ref 3.5–5.1)
Sodium: 129 mmol/L — ABNORMAL LOW (ref 135–145)

## 2023-08-28 LAB — CBC WITH DIFFERENTIAL/PLATELET
Abs Immature Granulocytes: 0.03 K/uL (ref 0.00–0.07)
Basophils Absolute: 0 K/uL (ref 0.0–0.1)
Basophils Relative: 0 %
Eosinophils Absolute: 0.2 K/uL (ref 0.0–0.5)
Eosinophils Relative: 2 %
HCT: 23 % — ABNORMAL LOW (ref 36.0–46.0)
Hemoglobin: 7.4 g/dL — ABNORMAL LOW (ref 12.0–15.0)
Immature Granulocytes: 0 %
Lymphocytes Relative: 17 %
Lymphs Abs: 1.2 K/uL (ref 0.7–4.0)
MCH: 29 pg (ref 26.0–34.0)
MCHC: 32.2 g/dL (ref 30.0–36.0)
MCV: 90.2 fL (ref 80.0–100.0)
Monocytes Absolute: 0.6 K/uL (ref 0.1–1.0)
Monocytes Relative: 9 %
Neutro Abs: 4.7 K/uL (ref 1.7–7.7)
Neutrophils Relative %: 72 %
Platelets: 305 K/uL (ref 150–400)
RBC: 2.55 MIL/uL — ABNORMAL LOW (ref 3.87–5.11)
RDW: 16.7 % — ABNORMAL HIGH (ref 11.5–15.5)
WBC: 6.7 K/uL (ref 4.0–10.5)
nRBC: 0 % (ref 0.0–0.2)

## 2023-08-28 MED ORDER — HEPARIN SODIUM (PORCINE) 1000 UNIT/ML DIALYSIS: 25.0000 [IU]/kg | INTRAMUSCULAR | Status: DC | PRN

## 2023-08-28 NOTE — Progress Notes (Signed)
 Patient is pleasant and cooperative.  Endorses anxiety.  Denies SI/HI and AVH.  Denies pain. Compliant with scheduled medications (morning meds given after dialysis).  15 min checks in place for safety.  Patient is present in the milieu.  Appropriate interaction with peers and staff.    Patient went to dialysis this morning.

## 2023-08-28 NOTE — Plan of Care (Signed)
 ?  Problem: Education: ?Goal: Knowledge of the prescribed therapeutic regimen will improve ?Outcome: Progressing ?  ?Problem: Coping: ?Goal: Coping ability will improve ?Outcome: Progressing ?Goal: Will verbalize feelings ?Outcome: Progressing ?  ?

## 2023-08-28 NOTE — Progress Notes (Signed)
 Lab called with a critical value.   Potassium level of 2.6  NP Shantelle Breeze made aware and stated it will be corrected wit dialysis.

## 2023-08-28 NOTE — Plan of Care (Addendum)
   Problem: Activity: Goal: Will verbalize the importance of balancing activity with adequate rest periods Outcome: Progressing   Problem: Education: Goal: Will be free of psychotic symptoms Outcome: Progressing Goal: Knowledge of the prescribed therapeutic regimen will improve Outcome: Progressing   Problem: Coping: Goal: Coping ability will improve Outcome: Progressing Goal: Will verbalize feelings Outcome: Progressing   Problem: Health Behavior/Discharge Planning: Goal: Compliance with prescribed medication regimen will improve Outcome: Progressing   Problem: Nutritional: Goal: Ability to achieve adequate nutritional intake will improve Outcome: Progressing   Problem: Role Relationship: Goal: Ability to communicate needs accurately will improve Outcome: Progressing Goal: Ability to interact with others will improve Outcome: Progressing   Problem: Safety: Goal: Ability to redirect hostility and anger into socially appropriate behaviors will improve Outcome: Progressing Goal: Ability to remain free from injury will improve Outcome: Progressing   Problem: Self-Care: Goal: Ability to participate in self-care as condition permits will improve Outcome: Progressing   Problem: Self-Concept: Goal: Will verbalize positive feelings about self Outcome: Progressing   Problem: Education: Goal: Utilization of techniques to improve thought processes will improve Outcome: Progressing Goal: Knowledge of the prescribed therapeutic regimen will improve Outcome: Progressing

## 2023-08-28 NOTE — Group Note (Signed)
 Date:  08/28/2023 Time:  9:08 PM  Group Topic/Focus:  Wrap-Up Group:   The focus of this group is to help patients review their daily goal of treatment and discuss progress on daily workbooks.    Participation Level:  Active  Participation Quality:  Appropriate  Affect:  Appropriate  Cognitive:  Appropriate  Insight: Appropriate  Engagement in Group:  Engaged  Modes of Intervention:  Discussion  Additional Comments:    Tiffany [name] CHRISTELLA Bunker 08/28/2023, 9:08 PM

## 2023-08-28 NOTE — Progress Notes (Addendum)
   08/28/23 2020  Psych Admission Type (Psych Patients Only)  Admission Status Voluntary  Psychosocial Assessment  Patient Complaints None  Eye Contact Fair  Facial Expression Animated  Affect Appropriate to circumstance  Speech Logical/coherent  Interaction Assertive  Motor Activity Other (Comment);Slow (wheelchair)  Appearance/Hygiene In scrubs  Behavior Characteristics Cooperative;Appropriate to situation  Mood Pleasant  Thought Process  Coherency WDL  Content WDL  Delusions None reported or observed  Perception WDL  Hallucination None reported or observed  Judgment Impaired  Confusion Mild  Danger to Self  Current suicidal ideation? Denies  Danger to Others  Danger to Others None reported or observed

## 2023-08-28 NOTE — Group Note (Signed)
 Physical/Occupational Therapy Group Note  Group Topic: Functional, Dynamic Balance   Group Date: 08/28/2023 Start Time: 1530 End Time: 1600 Facilitators: Shawndell Varas, Alm Hamilton, PT   Group Description: Group discussed impact of balance on safety and independence with functional tasks.  Identified and discussed any self-perceived balance deficits to personalize information.  Discussed and reviewed strategies to address/improve balance deficits: use of assist devices, activity pacing/energy conservation, environment/home safety modifications, focusing attention/minimizing distraction.  Reviewed and participated with standing LE therex designed to target dynamic balance reactions and LE strength/stability; provided handouts with HEP to be utilized outside of group time as appropriate.  Allowed time for questions and further discussion on any balance or mobility concerns/needs.  Therapeutic Goal(s):  Identify and discuss any individual balance deficits and functional implications. Identify and discuss any environmental/home safety modifications that can optimize balance and safety for mobility within the home. Demonstrate understanding and performance of standing therex designed to target dynamic balance deficits.  Individual Participation: Did not attend  Participation Level:   Participation Quality:   Behavior:   Speech/Thought Process:   Affect/Mood:   Insight:   Judgement:   Modes of Intervention:   Patient Response to Interventions:    Plan: Continue to engage patient in PT/OT groups 1 - 2x/week.  CHARM Hamilton Bertin PT, DPT 08/28/23, 4:37 PM

## 2023-08-28 NOTE — Group Note (Signed)
 Date:  08/28/2023 Time:  11:04 AM  Group Topic/Focus:  Gratitude Group The purpose of this group is a exercise that goes over things they are grateful discussing some facts about their lives and how can they improve their positive thinking.    Participation Level:  Did Not Attend  Additional Comments:  Pt off unit   Tiffany [name] [name] 08/28/2023, 11:04 AM

## 2023-08-28 NOTE — Progress Notes (Signed)
 [facility]  ROUNDING NOTE   Subjective:   Tiffany [name]  is a 63 y.o.  female  with an extensive past medical history including schizophrenia, hypertension, CHF, diabetes, stroke, and end-stage renal disease on hemodialysis. [name] currently admitted at inpatient behavioral health for Schizoaffective disorder [facility]) [F25.9]  Patient is known to our practice from previous admission.  States she has missed 5 days of dialysis outpatient.  Did not feel like going.  Patient seen and evaluated during.   Update: Patient seen and evaluated during dialysis   HEMODIALYSIS FLOWSHEET:  Blood Flow Rate (mL/min): 269 mL/min Arterial Pressure (mmHg): -105.45 mmHg Venous Pressure (mmHg): 81.41 mmHg TMP (mmHg): 17.57 mmHg Ultrafiltration Rate (mL/min): 554 mL/min Dialysate Flow Rate (mL/min): 299 ml/min Dialysis Fluid Bolus: Normal Saline Bolus Amount (mL): 100 mL  Resting quietly during treatment   Objective:  Vital signs in last 24 hours:  Temp:  [97.1 F (36.2 C)-98.2 F (36.8 C)] 97.1 F (36.2 C) (08/12 0904) Pulse Rate:  [67-87] 76 (08/12 1030) Resp:  [14-25] 25 (08/12 1030) BP: (98-172)/(56-94) 126/88 (08/12 1030) SpO2:  [100 %] 100 % (08/12 1030) Weight:  [86.8 kg] 86.8 kg (08/12 0904)  Weight change:  Filed Weights   08/23/23 1339 08/25/23 1504 08/28/23 0904  Weight: 84.3 kg 86.1 kg 86.8 kg    Intake/Output: No intake/output data recorded.   Intake/Output this shift:  No intake/output data recorded.  Physical Exam: General: NAD, seated in chair  Head: Normocephalic, atraumatic. Moist oral mucosal membranes  Eyes: Anicteric  Neck: Supple  Lungs:  Clear to auscultation, normal effort  Heart: Regular rate and rhythm  Abdomen:  Soft, nontender  Extremities: No peripheral edema.  Neurologic: Awake, alert, conversant  Skin: Warm,dry, no rash  Access: Right chest IJ PermCath    Basic Metabolic Panel: Recent Labs  Lab 08/23/23 0947 08/25/23 1113   NA 132* 130*  K 2.9* 3.0*  CL 95* 93*  CO2 26 25  GLUCOSE 99 85  BUN 56* 65*  CREATININE 3.95* 3.68*  CALCIUM  9.7 9.5  PHOS 4.2 4.7*    Liver Function Tests: Recent Labs  Lab 08/23/23 0947 08/25/23 1113  ALBUMIN  3.4* 3.7   No results for input(s): LIPASE, AMYLASE in the last 168 hours. No results for input(s): AMMONIA  in the last 168 hours.  CBC: Recent Labs  Lab 08/23/23 0947 08/25/23 1113  WBC 10.0 8.2  NEUTROABS 7.7 6.1  HGB 8.3* 8.6*  HCT 26.2* 27.5*  MCV 92.6 92.6  PLT 314 306    Cardiac Enzymes: No results for input(s): CKTOTAL, CKMB, CKMBINDEX, TROPONINI in the last 168 hours.  BNP: Invalid input(s): POCBNP  CBG: No results for input(s): GLUCAP in the last 168 hours.  Microbiology: Results for orders placed or performed during the hospital encounter of 08/13/23  SARS Coronavirus 2 by RT PCR (hospital order, performed in [facility] hospital lab) *cepheid single result test* Anterior Nasal Swab     Status: None   Collection Time: 08/15/23 11:07 AM   Specimen: Anterior Nasal Swab  Result Value Ref Range Status   SARS Coronavirus 2 by RT PCR NEGATIVE NEGATIVE Final    Comment: Performed at [facility], [address] [place], [place] [address]   *Note: Due to a large number of results and/or encounters for the requested time period, some results have not been displayed. A complete set of results can be found in Results Review.    Coagulation Studies: No results for input(s):  LABPROT, INR in the last 72 hours.  Urinalysis: No results for input(s): COLORURINE, LABSPEC, PHURINE, GLUCOSEU, HGBUR, BILIRUBINUR, KETONESUR, PROTEINUR, UROBILINOGEN, NITRITE, LEUKOCYTESUR in the last 72 hours.  Invalid input(s): APPERANCEUR    Imaging: No results found.   Medications:     amLODipine   5 mg Oral BID   atorvastatin   20 mg Oral Daily   calcitRIOL   0.5 mcg Oral Daily   carvedilol   12.5 mg Oral BID    Chlorhexidine  Gluconate Cloth  6 each Topical Q0600   dicyclomine   10 mg Oral TID   docusate sodium   100 mg Oral Daily   epoetin  alfa-epbx (RETACRIT ) injection  10,000 Units Intravenous Q T,Th,Sat-1800   escitalopram   5 mg Oral Daily   ezetimibe   10 mg Oral Daily   ferrous sulfate   325 mg Oral Daily   folic acid   1 mg Oral Daily   gabapentin   100 mg Oral QHS   melatonin  5 mg Oral QHS   metolazone   2.5 mg Oral Q M,W,F   mirtazapine   15 mg Oral QHS   pantoprazole   40 mg Oral Daily   torsemide   40 mg Oral BID   albuterol , alteplase , capsaicin , heparin , heparin , hydrOXYzine , OLANZapine , OLANZapine  zydis  Assessment/ Plan:  [name] is a 63 y.o.  female  with an extensive past medical history including schizophrenia, hypertension, CHF, diabetes, stroke, and end-stage renal disease on hemodialysis.   [facility] [place]/TTS/Rt permcath Time 10:25am  End-stage renal disease on hemodialysis.  Will maintain outpatient  TTS schedule during this admission.  Receiving dialysis today, UF 0.5-1L as tolerated. Next treatment scheduled for Thursday  2. Anemia of chronic kidney disease Lab Results  Component Value Date   HGB 8.6 (L) 08/25/2023    Hemoglobin improving.  Continue Retacrit  10000 units with dialysis.  3. Secondary Hyperparathyroidism: with outpatient labs: PTH 1310, phosphorus 5.9, calcium  8.4 on 07/26/2023.   Lab Results  Component Value Date   PTH 57 02/18/2020   CALCIUM  9.5 08/25/2023   CAION 1.03 (L) 06/14/2023   PHOS 4.7 (H) 08/25/2023    Awaiting updated labs   LOS: 13 Dante Cooter 8/12/202510:35 AM
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
## 2023-08-28 NOTE — Progress Notes (Signed)
   08/27/23 2100  Psych Admission Type (Psych Patients Only)  Admission Status Voluntary  Psychosocial Assessment  Patient Complaints None  Eye Contact Fair  Facial Expression Flat  Affect Flat  Speech Soft  Interaction Minimal  Motor Activity Slow (wheelchair)  Appearance/Hygiene In scrubs  Behavior Characteristics Cooperative  Mood Pleasant  Thought Process  Coherency WDL  Content WDL  Delusions None reported or observed  Perception WDL  Hallucination None reported or observed  Judgment Impaired  Confusion Mild  Danger to Self  Current suicidal ideation? Denies  Danger to Others  Danger to Others None reported or observed

## 2023-08-28 NOTE — Group Note (Signed)
 Recreation Therapy Group Note   Group Topic:Other  Group Date: 08/28/2023 Start Time: 1400 End Time: 1500 Facilitators: Celestia Jeoffrey BRAVO, LRT, CTRS Location: Courtyard  Group Description: Outdoor Recreation. Patients had the option to play corn hole, ring toss, bowling or listening to music while outside in the courtyard getting fresh air and sunlight. Patients helped water  and prune the raised garden beds. LRT and patients discussed things that they enjoy doing in their free time outside of the hospital. LRT encouraged patients to drink water  after being active and getting their heart rate up.   Goal Area(s) Addressed: Patient will identify leisure interests.  Patient will practice healthy decision making. Patient will engage in recreation activity.   Affect/Mood: N/A   Participation Level: Did not attend    Clinical Observations/Individualized Feedback: Patient did not attend group.   Plan: Continue to engage patient in RT group sessions 2-3x/week.   Jeoffrey BRAVO Celestia, LRT, CTRS 08/28/2023 4:38 PM

## 2023-08-29 DIAGNOSIS — F259 Schizoaffective disorder, unspecified: Secondary | ICD-10-CM | POA: Diagnosis not present

## 2023-08-29 NOTE — Progress Notes (Signed)
   08/29/23 1200  Psych Admission Type (Psych Patients Only)  Admission Status Voluntary  Psychosocial Assessment  Patient Complaints None  Eye Contact Fair  Facial Expression Animated  Affect Appropriate to circumstance  Speech Logical/coherent  Interaction Assertive  Motor Activity Slow  Appearance/Hygiene In scrubs  Behavior Characteristics Cooperative;Appropriate to situation  Mood Pleasant  Thought Process  Coherency WDL  Content WDL  Delusions None reported or observed  Perception WDL  Hallucination None reported or observed  Judgment Impaired  Confusion Mild  Danger to Self  Current suicidal ideation? Denies  Danger to Others  Danger to Others None reported or observed

## 2023-08-29 NOTE — Group Note (Signed)
 Date:  08/29/2023 Time:  9:29 PM  Group Topic/Focus:  Identifying Needs:   The focus of this group is to help patients identify their personal needs that have been historically problematic and identify healthy behaviors to address their needs.    Participation Level:  Active  Participation Quality:  Appropriate  Affect:  Appropriate  Cognitive:  Appropriate  Insight: Appropriate  Engagement in Group:  Engaged  Modes of Intervention:  Education  Additional Comments:    Tiffany [name] 08/29/2023, 9:29 PM

## 2023-08-29 NOTE — Group Note (Signed)
 Date:  08/29/2023 Time:  2:08 PM  Group Topic/Focus:  Managing Feelings:   The focus of this group is to identify what feelings patients have difficulty handling and develop a plan to handle them in a healthier way upon discharge.    Participation Level:  Active  Participation Quality:  Appropriate  Affect:  Appropriate  Cognitive:  Appropriate  Insight: Appropriate  Engagement in Group:  Engaged  Modes of Intervention:  Discussion  Additional Comments:  N/A  [name] 08/29/2023, 2:08 PM

## 2023-08-29 NOTE — Progress Notes (Signed)
 Pt receives outpatient dialysis care at [facility] TTS with Chair time 10:25am.  Suzen Satchel Dialysis Navigator [phone]
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
## 2023-08-30 DIAGNOSIS — F259 Schizoaffective disorder, unspecified: Secondary | ICD-10-CM | POA: Diagnosis not present

## 2023-08-30 LAB — CBC
HCT: 23.9 % — ABNORMAL LOW (ref 36.0–46.0)
Hemoglobin: 7.6 g/dL — ABNORMAL LOW (ref 12.0–15.0)
MCH: 28.7 pg (ref 26.0–34.0)
MCHC: 31.8 g/dL (ref 30.0–36.0)
MCV: 90.2 fL (ref 80.0–100.0)
Platelets: 258 K/uL (ref 150–400)
RBC: 2.65 MIL/uL — ABNORMAL LOW (ref 3.87–5.11)
RDW: 16.4 % — ABNORMAL HIGH (ref 11.5–15.5)
WBC: 6.5 K/uL (ref 4.0–10.5)
nRBC: 0 % (ref 0.0–0.2)

## 2023-08-30 LAB — RENAL FUNCTION PANEL
Albumin: 3.2 g/dL — ABNORMAL LOW (ref 3.5–5.0)
Anion gap: 12 (ref 5–15)
BUN: 63 mg/dL — ABNORMAL HIGH (ref 8–23)
CO2: 27 mmol/L (ref 22–32)
Calcium: 9.4 mg/dL (ref 8.9–10.3)
Chloride: 91 mmol/L — ABNORMAL LOW (ref 98–111)
Creatinine, Ser: 4 mg/dL — ABNORMAL HIGH (ref 0.44–1.00)
GFR, Estimated: 12 mL/min — ABNORMAL LOW (ref >60)
Glucose, Bld: 102 mg/dL — ABNORMAL HIGH (ref 70–99)
Phosphorus: 5.2 mg/dL — ABNORMAL HIGH (ref 2.5–4.6)
Potassium: 2.7 mmol/L — CL (ref 3.5–5.1)
Sodium: 130 mmol/L — ABNORMAL LOW (ref 135–145)

## 2023-08-30 MED ORDER — POTASSIUM CHLORIDE CRYS ER 10 MEQ PO TBCR
20.0000 meq | EXTENDED_RELEASE_TABLET | Freq: Two times a day (BID) | ORAL | Status: DC
  Administered 2023-08-30 – 2023-09-07 (×15): 20 meq via ORAL
  Filled 2023-08-30 (×16): qty 2

## 2023-08-30 MED ORDER — EPOETIN ALFA-EPBX 10000 UNIT/ML IJ SOLN
INTRAMUSCULAR | Status: AC
  Filled 2023-08-30: qty 1

## 2023-08-30 MED ORDER — HEPARIN SODIUM (PORCINE) 1000 UNIT/ML IJ SOLN
INTRAMUSCULAR | Status: AC
  Filled 2023-08-30: qty 4

## 2023-08-30 NOTE — Plan of Care (Signed)
   Problem: Activity: Goal: Will verbalize the importance of balancing activity with adequate rest periods Outcome: Progressing   Problem: Education: Goal: Will be free of psychotic symptoms Outcome: Progressing Goal: Knowledge of the prescribed therapeutic regimen will improve Outcome: Progressing   Problem: Coping: Goal: Coping ability will improve Outcome: Progressing Goal: Will verbalize feelings Outcome: Progressing   Problem: Health Behavior/Discharge Planning: Goal: Compliance with prescribed medication regimen will improve Outcome: Progressing   Problem: Nutritional: Goal: Ability to achieve adequate nutritional intake will improve Outcome: Progressing   Problem: Role Relationship: Goal: Ability to communicate needs accurately will improve Outcome: Progressing Goal: Ability to interact with others will improve Outcome: Progressing   Problem: Safety: Goal: Ability to redirect hostility and anger into socially appropriate behaviors will improve Outcome: Progressing Goal: Ability to remain free from injury will improve Outcome: Progressing   Problem: Self-Care: Goal: Ability to participate in self-care as condition permits will improve Outcome: Progressing   Problem: Self-Concept: Goal: Will verbalize positive feelings about self Outcome: Progressing   Problem: Education: Goal: Utilization of techniques to improve thought processes will improve Outcome: Progressing Goal: Knowledge of the prescribed therapeutic regimen will improve Outcome: Progressing

## 2023-08-30 NOTE — Group Note (Signed)
 Date:  08/30/2023 Time:  10:56 AM  Group Topic/Focus:  Dimensions of Wellness:   The focus of this group is to introduce the topic of wellness and discuss the role each dimension of wellness plays in total health.    Participation Level:  Did Not Attend   [name] 08/30/2023, 10:56 AM

## 2023-08-30 NOTE — Progress Notes (Signed)
 [facility]  ROUNDING NOTE   Subjective:   Tiffany [name]  is a 63 y.o.  female  with an extensive past medical history including schizophrenia, hypertension, CHF, diabetes, stroke, and end-stage renal disease on hemodialysis. [name] currently admitted at inpatient behavioral health for Schizoaffective disorder [facility]) [F25.9]  Patient is known to our practice from previous admission.  States she has missed 5 days of dialysis outpatient.  Did not feel like going.  Patient seen and evaluated during.   Update: Patient due for hemodialysis treatment today. States that she has had some cramps in her hands recently.  Objective:  Vital signs in last 24 hours:  Temp:  [97.4 F (36.3 C)-98.2 F (36.8 C)] 97.4 F (36.3 C) (08/14 0730) Pulse Rate:  [80-142] 142 (08/14 0730) Resp:  [12-18] 12 (08/14 0730) BP: (118-157)/(76-93) 157/93 (08/14 0730) SpO2:  [100 %] 100 % (08/14 0730)  Weight change:  Filed Weights   08/25/23 1504 08/28/23 0904 08/28/23 1234  Weight: 86.1 kg 86.8 kg 87.2 kg    Intake/Output: No intake/output data recorded.   Intake/Output this shift:  No intake/output data recorded.  Physical Exam: General: NAD, seated in chair  Head: Normocephalic, atraumatic. Moist oral mucosal membranes  Eyes: Anicteric  Neck: Supple  Lungs:  Clear to auscultation, normal effort  Heart: Regular rate and rhythm  Abdomen:  Soft, nontender  Extremities: No peripheral edema.  Neurologic: Awake, alert, conversant  Skin: Warm,dry, no rash  Access: Right chest IJ PermCath    Basic Metabolic Panel: Recent Labs  Lab 08/23/23 0947 08/25/23 1113 08/28/23 1014  NA 132* 130* 129*  K 2.9* 3.0* 2.6*  CL 95* 93* 90*  CO2 26 25 23   GLUCOSE 99 85 99  BUN 56* 65* 74*  CREATININE 3.95* 3.68* 4.45*  CALCIUM  9.7 9.5 9.2  PHOS 4.2 4.7* 5.2*    Liver Function Tests: Recent Labs  Lab 08/23/23 0947 08/25/23 1113 08/28/23 1014  ALBUMIN  3.4* 3.7 3.1*   No results for  input(s): LIPASE, AMYLASE in the last 168 hours. No results for input(s): AMMONIA  in the last 168 hours.  CBC: Recent Labs  Lab 08/23/23 0947 08/25/23 1113 08/28/23 1014  WBC 10.0 8.2 6.7  NEUTROABS 7.7 6.1 4.7  HGB 8.3* 8.6* 7.4*  HCT 26.2* 27.5* 23.0*  MCV 92.6 92.6 90.2  PLT 314 306 305    Cardiac Enzymes: No results for input(s): CKTOTAL, CKMB, CKMBINDEX, TROPONINI in the last 168 hours.  BNP: Invalid input(s): POCBNP  CBG: No results for input(s): GLUCAP in the last 168 hours.  Microbiology: Results for orders placed or performed during the hospital encounter of 08/13/23  SARS Coronavirus 2 by RT PCR (hospital order, performed in [facility] Inpatient [facility] lab) *cepheid single result test* Anterior Nasal Swab     Status: None   Collection Time: 08/15/23 11:07 AM   Specimen: Anterior Nasal Swab  Result Value Ref Range Status   SARS Coronavirus 2 by RT PCR NEGATIVE NEGATIVE Final    Comment: Performed at [facility], [address] [place], [place] [address]   *Note: Due to a large number of results and/or encounters for the requested time period, some results have not been displayed. A complete set of results can be found in Results Review.    Coagulation Studies: No results for input(s): LABPROT, INR in the last 72 hours.  Urinalysis: No results for input(s): COLORURINE, LABSPEC, PHURINE, GLUCOSEU, HGBUR, BILIRUBINUR, KETONESUR, PROTEINUR, UROBILINOGEN, NITRITE, LEUKOCYTESUR in the last  72 hours.  Invalid input(s): APPERANCEUR    Imaging: No results found.   Medications:     amLODipine   5 mg Oral BID   atorvastatin   20 mg Oral Daily   calcitRIOL   0.5 mcg Oral Daily   carvedilol   12.5 mg Oral BID   Chlorhexidine  Gluconate Cloth  6 each Topical Q0600   dicyclomine   10 mg Oral TID   docusate sodium   100 mg Oral Daily   epoetin  alfa-epbx (RETACRIT ) injection  10,000 Units Intravenous Q T,Th,Sat-1800    escitalopram   5 mg Oral Daily   ezetimibe   10 mg Oral Daily   ferrous sulfate   325 mg Oral Daily   folic acid   1 mg Oral Daily   gabapentin   100 mg Oral QHS   melatonin  5 mg Oral QHS   metolazone   2.5 mg Oral Q M,W,F   mirtazapine   15 mg Oral QHS   pantoprazole   40 mg Oral Daily   torsemide   40 mg Oral BID   albuterol , capsaicin , hydrOXYzine , OLANZapine , OLANZapine  zydis  Assessment/ Plan:  Tiffany [name] is a 63 y.o.  female  with an extensive past medical history including schizophrenia, hypertension, CHF, diabetes, stroke, and end-stage renal disease on hemodialysis.   [facility] [place]/TTS/Rt permcath Time 10:25am  End-stage renal disease on hemodialysis.  Patient due for dialysis treatment today.  States that she is having some cramps in her hands.  UF target only 1 kg today.  2. Anemia of chronic kidney disease Lab Results  Component Value Date   HGB 7.4 (L) 08/28/2023    Hemoglobin 7.4 at last check.  Continue Retacrit  10,000's IV with dialysis.  3. Secondary Hyperparathyroidism: with outpatient labs: PTH 1310, phosphorus 5.9, calcium  8.4 on 07/26/2023.   Lab Results  Component Value Date   PTH 57 02/18/2020   CALCIUM  9.2 08/28/2023   CAION 1.03 (L) 06/14/2023   PHOS 5.2 (H) 08/28/2023    Phosphorus acceptable at 5.2.  Maintain the patient on calcitriol .   LOS: 15 Pristine [place] 8/14/20258:05 AM

## 2023-08-30 NOTE — Group Note (Signed)
 Recreation Therapy Group Note   Group Topic:Relaxation  Group Date: 08/30/2023 Start Time: 1400 End Time: 1500 Facilitators: Celestia Jeoffrey BRAVO, LRT, CTRS Location: Courtyard  Group Description: Music. Patients encouraged to name their favorite song(s) for LRT to play song through speaker for group to hear, while in the courtyard getting fresh air and sunlight. Patients educated on the definition of leisure and the importance of having different leisure interests outside of the hospital. Group discussed how leisure activities can often be used as Pharmacologist and that listening to music and being outside are examples.    Goal Area(s) Addressed:  Patient will identify a current leisure interest.  Patient will practice making a positive decision. Patient will have the opportunity to try a new leisure activity.   Affect/Mood: Appropriate   Participation Level: Active and Engaged   Participation Quality: Independent   Behavior: Appropriate, Calm, and Cooperative   Speech/Thought Process: Coherent   Insight: Good   Judgement: Good   Modes of Intervention: Music, Open Conversation, Rapport Building, and Socialization   Patient Response to Interventions:  Attentive, Engaged, Interested , and Receptive   Education Outcome:  Acknowledges education   Clinical Observations/Individualized Feedback: Maevis was active in their participation of session activities and group discussion. Pt shared that she likes listening to country music. Pt interacted well with LRT and peers duration of session.    Plan: Continue to engage patient in RT group sessions 2-3x/week.   Jeoffrey BRAVO Celestia, LRT, CTRS 08/30/2023 5:30 PM

## 2023-08-30 NOTE — Progress Notes (Signed)
 [facility] MD Progress Note  08/30/2023 10:04 PM HALENA MOHAR  MRN:  [id number]  ill M Pilger is a 63 y.o. female admitted: Presented to the [facility] 08/13/2023  7:48 PM for after presenting due to a fall later patient exhibit extreme paranoia. She carries the psychiatric diagnoses of PTSD, MDD with and without psychotic features, schizoaffective disorder, and history of polysubstance use and has a past medical history of hypertension, ESRD with HD, congestive heart failure, diabetes, COPD, , stroke. Her current presentation of paranoia, delusional thoughts, auditory and visual hallucinations is most consistent with schizoaffective disorder. She meets criteria for inpatient psychiatric admission based on current symptom Patient is admitted to [facility] unit with Q15 min safety monitoring. Multidisciplinary team approach is offered. Medication management; group/milieu therapy is offered.    Subjective: Chart is reviewed and discussed the case with treatment team.  Patient is noted to be engaging with peers in the dayroom.  She offers no complaints.  She reports that she called the domestic violence shelter and they reported due to her medical problems and medication requirements they are unable to accept her and informed her that they do not have beds at this point.  Patient reports that she is trying to call some other shelters in [place] area.  Provider encouraged patient to work with the social work on the team regarding reaching out to these places.  She denies SI/HI/plan and denies hallucinations.  [name] taking her medications with no reported side effects.     Sleep: Fair  Appetite:  Fair  Past Psychiatric History: see h&P Family History:  Family History  Problem Relation Age of Onset   Cancer Mother        lung   Depression Mother    Cancer Father        prostate   Depression Sister    Anxiety disorder Sister    Schizophrenia Sister    Bipolar disorder Sister    Depression Sister     Depression Brother    Heart failure Other        cousin   Social History:  Social History   Substance and Sexual Activity  Alcohol Use No   Alcohol/week: 0.0 standard drinks of alcohol     Social History   Substance and Sexual Activity  Drug Use Not Currently   Types: Cocaine   Comment: 04/10/2016 last used cocaine back in November 2017    Social History   Socioeconomic History   Marital status: Widowed    Spouse name: Not on file   Number of children: 3   Years of education: 12th   Highest education level: 12th grade  Occupational History   Occupation: disabled    Comment: factory production  Tobacco Use   Smoking status: Former    Current packs/day: 0.00    Average packs/day: 1.5 packs/day for 39.1 years (58.6 ttl pk-yrs)    Types: Cigarettes    Start date: 03/13/1977    Quit date: 04/10/2016    Years since quitting: 7.3    Passive exposure: Past   Smokeless tobacco: Never  Vaping Use   Vaping status: Never Used  Substance and Sexual Activity   Alcohol use: No    Alcohol/week: 0.0 standard drinks of alcohol   Drug use: Not Currently    Types: Cocaine    Comment: 04/10/2016 last used cocaine back in November 2017   Sexual activity: Not Currently    Birth control/protection: Surgical  Other Topics Concern  Not on file  Social History Narrative   Has 1 son, [name]   Lives with son and his boyfriend   Her house has ramps and handrails should she ever needs them.    Her mother lives down the street from her and is a good support person in addition to her son.   She drives herself, has private transportation.    Cocaine free since 02/24/16, smoke free since 04/10/16   Dialysis M-W-F   Social Drivers of Health   Financial Resource Strain: Low Risk  (07/11/2022)   Overall Financial Resource Strain (CARDIA)    Difficulty of Paying Living Expenses: Not very hard  Food Insecurity: Food Insecurity Present (08/15/2023)   Hunger Vital Sign    Worried About Running  Out of Food in the Last Year: Sometimes true    Ran Out of Food in the Last Year: Sometimes true  Transportation Needs: No Transportation Needs (08/15/2023)   PRAPARE - Administrator, Civil Service (Medical): No    Lack of Transportation (Non-Medical): No  Physical Activity: Inactive (07/11/2022)   Exercise Vital Sign    Days of Exercise per Week: 0 days    Minutes of Exercise per Session: 0 min  Stress: Stress Concern Present (07/11/2022)   [employer] of Occupational Health - Occupational Stress Questionnaire    Feeling of Stress : To some extent  Social Connections: Socially Isolated (06/28/2023)   Social Connection and Isolation Panel    Frequency of Communication with Friends and Family: Three times a week    Frequency of Social Gatherings with Friends and Family: Three times a week    Attends Religious Services: Never    Active Member of Clubs or Organizations: No    Attends Banker Meetings: Never    Marital Status: Widowed   Past Medical History:  Past Medical History:  Diagnosis Date   Abdominal pain    Acute encephalopathy 11/21/2017   Acute exacerbation of CHF (congestive heart failure) (HCC) 03/03/2022   Acute GI bleeding 03/29/2021   Acute metabolic encephalopathy 02/20/2020   Aggressive behavior    Agitation 11/22/2017   Anoxic brain injury (HCC) 09/08/2016   C. Arrest due to respiratory failure and COPD exacerbation   Anxiety    Arthritis    all over (04/10/2016)   Asthma 10/18/2010   Binge eating disorder    Blood loss anemia 04/24/2011   CAP (community acquired pneumonia) 06/22/2015   Cardiac arrest (HCC) 09/08/2016   PEA   Carotid artery stenosis    1-39% bilateral by dopplers 11/2016   Chronic diastolic (congestive) heart failure (HCC)    Chronic pain syndrome 06/18/2012   Chronic post-traumatic stress disorder (PTSD) 05/27/2018   Chronic respiratory failure with hypoxia and hypercapnia (HCC) 06/22/2015   TRILOGY Vent  >AVAPA-ES., Vt target 200-400, Max P 30 , PS max 20 , PS min 6-10 , E Max 6, E Min 4, Rate Auto AVAPS Rate 2 (titrate for pt comfort) , bleed O2 at 5l/m continuous flow .    Closed displaced fracture of fifth metacarpal bone 03/21/2018   Cocaine use disorder, severe, in sustained remission (HCC) 12/17/2015   Complication of anesthesia    decreased bp, decreased heart rate   COPD (chronic obstructive pulmonary disease) (HCC) 07/08/2014   Delusional disorder, persecutory type (HCC) 06/26/2021   Depression    Diabetic neuropathy (HCC) 04/24/2011   Difficulty with speech 01/24/2018   Disorder of nervous system    Drug abuse (HCC)  11/21/2017   Dyslipidemia 04/24/2011   E. coli UTI 02/20/2020   Elevated troponin 04/28/2012   Emphysema    Encephalopathy 11/21/2017   Essential hypertension 03/22/2016   Facet arthropathy, lumbar 06/12/2022   Fibula fracture 07/10/2016   Frequent falls 10/11/2017   GERD (gastroesophageal reflux disease)    GI bleed 03/30/2021   Gout 04/11/2017   Heart attack (HCC) 1980s   Hematochezia    History of blood transfusion 1994   couldn't stop bleeding from my period   History of drug abuse in remission (HCC) 11/28/2015   Quit in 2017   Hyperlipidemia 03/31/2021   Hyperlipidemia LDL goal <70    Hypocalcemia    Hypokalemia    Hypomagnesemia    Incontinence    Manic depression (HCC)    Morbid obesity (HCC) 10/18/2010   Obstructive sleep apnea 10/18/2010   On home oxygen  therapy    6L; 24/7 (04/10/2016)   OSA on CPAP    wear mask sometimes (04/10/2016)   Painless rectal bleeding 12/31/2015   Paranoid (HCC)    sometimes; I'm on RX for it (04/10/2016)   Periumbilical abdominal pain    Prolonged Q-T interval on ECG    Rectal bleeding 12/31/2015   Recurrent syncope 07/09/2021   Rhabdomyolysis 06/16/2021   Schizoaffective disorder, bipolar type (HCC) 04/05/2018   Seasonal allergies    Seborrheic keratoses 12/31/2013   Seizures (HCC)    don't know  what kind; last one was ~ 1 yr ago (04/10/2016)   Sinus bradycardia    Skin ulcer of sacrum, limited to breakdown of skin (HCC) 03/08/2022   Spondylolisthesis at L3-L4 level 06/12/2022   Stroke (HCC) 1980s   denies residual on 04/10/2016   Thrush 09/19/2013   Type 2 diabetes mellitus (HCC) 10/18/2010    Past Surgical History:  Procedure Laterality Date   A/V FISTULAGRAM N/A 02/01/2023   Procedure: A/V Fistulagram;  Surgeon: Norine Manuelita LABOR, MD;  Location: [facility] INVASIVE CV LAB;  Service: Cardiovascular;  Laterality: N/A;   AV FISTULA PLACEMENT Left 12/21/2022   Procedure: INSERTION OF LEFT ARM ARTERIOVENOUS (AV) GORE-TEX GRAFT;  Surgeon: Magda Debby SAILOR, MD;  Location: [facility] OR;  Service: Vascular;  Laterality: Left;   CESAREAN SECTION  1997   COLONOSCOPY WITH PROPOFOL  N/A 04/01/2021   Procedure: COLONOSCOPY WITH PROPOFOL ;  Surgeon: Rollin Dover, MD;  Location: [facility] ENDOSCOPY;  Service: Gastroenterology;  Laterality: N/A;  Rectal bleeding with drop in hemoglobin to 7.2 g/dL   HERNIA REPAIR     IR CHOLANGIOGRAM EXISTING TUBE  07/20/2016   IR PERC CHOLECYSTOSTOMY  05/10/2016   IR RADIOLOGIST EVAL & MGMT  06/08/2016   IR RADIOLOGIST EVAL & MGMT  06/29/2016   IR SINUS/FIST TUBE CHK-NON GI  07/12/2016   RIGHT/LEFT HEART CATH AND CORONARY ANGIOGRAPHY N/A 06/19/2017   Procedure: RIGHT/LEFT HEART CATH AND CORONARY ANGIOGRAPHY;  Surgeon: Cherrie Toribio SAUNDERS, MD;  Location: [facility] INVASIVE CV LAB;  Service: Cardiovascular;  Laterality: N/A;   TIBIA IM NAIL INSERTION Right 07/12/2016   Procedure: INTRAMEDULLARY (IM) NAIL RIGHT TIBIA;  Surgeon: Jerri Kay HERO, MD;  Location: [facility] OR;  Service: Orthopedics;  Laterality: Right;   UMBILICAL HERNIA REPAIR  ~ 1963   that's why I don't have a belly button   VAGINAL HYSTERECTOMY      Current Medications: Current Facility-Administered Medications  Medication Dose Route Frequency Provider Last Rate Last Admin   albuterol  (VENTOLIN  HFA) 108 (90 Base) MCG/ACT  inhaler 2 puff  2 puff Inhalation Q6H PRN Mannie,  Jerel PARAS, NP   2 puff at 08/29/23 2159   amLODipine  (NORVASC ) tablet 5 mg  5 mg Oral BID Mannie Jerel PARAS, NP   5 mg at 08/29/23 2202   atorvastatin  (LIPITOR ) tablet 20 mg  20 mg Oral Daily Mannie Jerel PARAS, NP   20 mg at 08/30/23 1244   calcitRIOL  (ROCALTROL ) capsule 0.5 mcg  0.5 mcg Oral Daily Qaadir Kent, MD   0.5 mcg at 08/30/23 1244   capsaicin  (ZOSTRIX) 0.025 % cream   Topical BID PRN Onuoha, Chinwendu V, NP   Given at 08/29/23 2125   carvedilol  (COREG ) tablet 12.5 mg  12.5 mg Oral BID Mannie Jerel PARAS, NP   12.5 mg at 08/30/23 1244   Chlorhexidine  Gluconate Cloth 2 % PADS 6 each  6 each Topical Q0600 Druscilla Bald, NP   6 each at 08/25/23 0857   dicyclomine  (BENTYL ) capsule 10 mg  10 mg Oral TID Mannie Jerel PARAS, NP   10 mg at 08/30/23 1644   docusate sodium  (COLACE) capsule 100 mg  100 mg Oral Daily Glendale Youngblood, MD   100 mg at 08/30/23 1245   epoetin  alfa-epbx (RETACRIT ) injection 10,000 Units  10,000 Units Intravenous Q T,Th,Sat-1800 Breeze, Bald, NP   10,000 Units at 08/30/23 9053   escitalopram  (LEXAPRO ) tablet 5 mg  5 mg Oral Daily Mannie Jerel PARAS, NP   5 mg at 08/30/23 1244   ezetimibe  (ZETIA ) tablet 10 mg  10 mg Oral Daily Tyesha Joffe, MD   10 mg at 08/30/23 1245   ferrous sulfate  tablet 325 mg  325 mg Oral Daily Elayne Gruver, MD   325 mg at 08/30/23 1244   folic acid  (FOLVITE ) tablet 1 mg  1 mg Oral Daily Fayelynn Distel, MD   1 mg at 08/30/23 1244   gabapentin  (NEURONTIN ) capsule 100 mg  100 mg Oral QHS Mannie Jerel PARAS, NP   100 mg at 08/29/23 2202   hydrOXYzine  (ATARAX ) tablet 10 mg  10 mg Oral TID PRN Mannie Jerel PARAS, NP   10 mg at 08/20/23 2112   melatonin tablet 5 mg  5 mg Oral QHS Onuoha, Chinwendu V, NP   5 mg at 08/29/23 2201   metolazone  (ZAROXOLYN ) tablet 2.5 mg  2.5 mg Oral Q M,W,F Yuli Lanigan, MD   2.5 mg at 08/29/23 9149   mirtazapine  (REMERON ) tablet 15 mg  15 mg Oral QHS Taydem Cavagnaro,  MD   15 mg at 08/29/23 2200   OLANZapine  (ZYPREXA ) injection 5 mg  5 mg Intramuscular TID PRN Mannie Jerel PARAS, NP       OLANZapine  zydis (ZYPREXA ) disintegrating tablet 5 mg  5 mg Oral TID PRN Mannie Jerel PARAS, NP   5 mg at 08/18/23 2318   pantoprazole  (PROTONIX ) EC tablet 40 mg  40 mg Oral Daily Romel Dumond, MD   40 mg at 08/30/23 1248   potassium chloride  (KLOR-CON  M) CR tablet 20 mEq  20 mEq Oral BID Druscilla Bald, NP   20 mEq at 08/30/23 1644   torsemide  (DEMADEX ) tablet 40 mg  40 mg Oral BID Mannie Jerel PARAS, NP   40 mg at 08/30/23 1643    Lab Results:  Results for orders placed or performed during the hospital encounter of 08/15/23 (from the past 48 hours)  CBC     Status: Abnormal   Collection Time: 08/30/23  7:00 AM  Result Value Ref Range   WBC 6.5 4.0 - 10.5 K/uL   RBC 2.65 (L) 3.87 -  5.11 MIL/uL   Hemoglobin 7.6 (L) 12.0 - 15.0 g/dL   HCT 76.0 (L) 63.9 - 53.9 %   MCV 90.2 80.0 - 100.0 fL   MCH 28.7 26.0 - 34.0 pg   MCHC 31.8 30.0 - 36.0 g/dL   RDW 83.5 (H) 88.4 - 84.4 %   Platelets 258 150 - 400 K/uL   nRBC 0.0 0.0 - 0.2 %    Comment: Performed at [facility], [address] [place], [place] [address]  Renal function panel     Status: Abnormal   Collection Time: 08/30/23  7:00 AM  Result Value Ref Range   Sodium 130 (L) 135 - 145 mmol/L   Potassium 2.7 (LL) 3.5 - 5.1 mmol/L    Comment: CRITICAL RESULT CALLED TO, READ BACK BY AND VERIFIED WITH MITSEY OAKLEY@0901  ON 08/30/23 BY HKP    Chloride 91 (L) 98 - 111 mmol/L   CO2 27 22 - 32 mmol/L   Glucose, Bld 102 (H) 70 - 99 mg/dL    Comment: Glucose reference range applies only to samples taken after fasting for at least 8 hours.   BUN 63 (H) 8 - 23 mg/dL   Creatinine, Ser 5.99 (H) 0.44 - 1.00 mg/dL   Calcium  9.4 8.9 - 10.3 mg/dL   Phosphorus 5.2 (H) 2.5 - 4.6 mg/dL   Albumin  3.2 (L) 3.5 - 5.0 g/dL   GFR, Estimated 12 (L) >60 mL/min    Comment: (NOTE) Calculated using the CKD-EPI Creatinine  Equation (2021)    Anion gap 12 5 - 15    Comment: Performed at [facility], [address], [place], [place] [address]   *Note: Due to a large number of results and/or encounters for the requested time period, some results have not been displayed. A complete set of results can be found in Results Review.      Blood Alcohol level:  Lab Results  Component Value Date   [facility] <15 05/31/2023   ETH <10 03/10/2023    Metabolic Disorder Labs: Lab Results  Component Value Date   HGBA1C 5.0 01/05/2022   MPG 99.67 06/17/2021   MPG 105.41 07/23/2020   No results found for: PROLACTIN Lab Results  Component Value Date   CHOL 151 11/14/2022   TRIG 100 11/14/2022   HDL 65 11/14/2022   CHOLHDL 2.3 11/14/2022   VLDL 20 11/14/2022   LDLCALC 66 11/14/2022   LDLCALC 54 08/05/2018    Physical Findings: AIMS:  , ,  ,  ,    CIWA:    COWS:      Psychiatric Specialty Exam:  Presentation  General Appearance:  Appropriate for Environment; Casual  Eye Contact: Fair  Speech: Clear and Coherent  Speech Volume: Normal    Mood and Affect  Mood: Normal Affect: Euthymic  Thought Process  Thought Processes: Disorganized But improving Descriptions of Associations:Intact  Orientation:Partial  Thought Content:Paranoid Ideation  Hallucinations: Auditory hallucinations  Ideas of Reference:Paranoia; Delusions  Suicidal Thoughts: Denies  Homicidal Thoughts: Denies   Sensorium  Memory: Immediate Fair; Recent Fair; Remote Poor  Judgment: Impaired  Insight: Shallow        Physical Exam: Physical Exam Vitals and nursing note reviewed.    ROS Blood pressure 117/79, pulse 92, temperature 98.4 F (36.9 C), resp. rate 16, weight 86.7 kg, SpO2 99%. Body mass index is 34.96 kg/m.  Diagnosis: Principal Problem:   Schizoaffective disorder [facility])   Clinical Decision Making: Patient with history of Schizoaffective disorder Bipolar type, end-stage  renal disease admitted for worsening paranoia and delusions about people abusing her, out to get her.  Her paranoia worsened to the point where she refused to leave the house and skipped her dialysis for 5 days which led up to medical emergency.  Patient needs inpatient psychiatric hospitalization for further stabilization.   Treatment Plan Summary:   Safety and Monitoring:             -- Voluntary admission to inpatient psychiatric unit for safety, stabilization and treatment             -- Daily contact with patient to assess and evaluate symptoms and progress in treatment             -- Patient's case to be discussed in multi-disciplinary team meeting             -- Observation Level: q15 minute checks             -- Vital signs:  q12 hours             -- Precautions: suicide, elopement, and assault   2. Psychiatric Diagnoses and Treatment:               Discontinued Zyprexa .    Invega  LAI 08/26/2023 Invega  9 mg daily-discontinued after the LAI to minimize daytime sedation. Lexapro  5 mg daily Prazosin    Patient has chronic delusions but exacerbated to the extent of not going to dialysis-   Will consider adding saphris  which is her home meds   -- The risks/benefits/side-effects/alternatives to this medication were discussed in detail with the patient and time was given for questions. The patient consents to medication trial.                -- Metabolic profile and EKG monitoring obtained while on an atypical antipsychotic (BMI: Lipid Panel: HbgA1c: QTc:)              -- Encouraged patient to participate in unit milieu and in scheduled group therapies                            3. Medical Issues Being Addressed:   ESRD: consulted nephrology for dialysis 4. Discharge Planning:   -- Social work and case management to assist with discharge planning and identification of hospital follow-up needs prior to discharge  -- Estimated LOS: 3-4 days  Jaman Aro, MD 08/30/2023, 10:04 PM

## 2023-08-30 NOTE — Progress Notes (Signed)
  Received patient in bed to unit.   Informed consent signed and in chart.    TX duration: 3.25     Transported back to floor  Hand-off given to patient's nurse. No acute distress noted    Access  used  R HD Catheter  Access issues: none   Total UF removed: 0.6L Medication(s) given: retacrit  Post HD VS:  wnl      Olivia Hurst LPN [facility]

## 2023-08-30 NOTE — Progress Notes (Signed)
   08/29/23 2100  Psych Admission Type (Psych Patients Only)  Admission Status Voluntary  Psychosocial Assessment  Patient Complaints None  Eye Contact Fair  Facial Expression Animated  Affect Appropriate to circumstance  Speech Logical/coherent  Interaction Assertive  Motor Activity Slow  Appearance/Hygiene In scrubs  Behavior Characteristics Cooperative;Appropriate to situation  Mood Pleasant  Thought Process  Coherency WDL  Content WDL  Delusions None reported or observed  Perception WDL  Hallucination None reported or observed  Judgment Impaired  Confusion Mild  Danger to Self  Current suicidal ideation? Denies  Danger to Others  Danger to Others None reported or observed

## 2023-08-30 NOTE — Plan of Care (Signed)
  Problem: Education: Goal: Will be free of psychotic symptoms Outcome: Progressing Goal: Knowledge of the prescribed therapeutic regimen will improve Outcome: Progressing   Problem: Coping: Goal: Coping ability will improve Outcome: Progressing Goal: Will verbalize feelings Outcome: Progressing   

## 2023-08-31 DIAGNOSIS — F259 Schizoaffective disorder, unspecified: Secondary | ICD-10-CM | POA: Diagnosis not present

## 2023-08-31 MED ORDER — TRAZODONE HCL 50 MG PO TABS
50.0000 mg | ORAL_TABLET | Freq: Every day | ORAL | Status: DC
  Administered 2023-08-31: 50 mg via ORAL
  Filled 2023-08-31 (×2): qty 1

## 2023-08-31 MED ORDER — HEPARIN SODIUM (PORCINE) 1000 UNIT/ML DIALYSIS: 25.0000 [IU]/kg | INTRAMUSCULAR | Status: DC | PRN

## 2023-08-31 MED ORDER — HYDROXYZINE HCL 25 MG PO TABS
25.0000 mg | ORAL_TABLET | Freq: Four times a day (QID) | ORAL | Status: DC | PRN
  Administered 2023-08-31 – 2023-09-05 (×3): 25 mg via ORAL
  Filled 2023-08-31 (×3): qty 1

## 2023-08-31 NOTE — Group Note (Signed)
 Recreation Therapy Group Note   Group Topic:Emotion Expression  Group Date: 08/31/2023 Start Time: 1400 End Time: 1500 Facilitators: Celestia Jeoffrey BRAVO, LRT, CTRS Location: Dayroom  Group Description: Painting a [employer]omatic [employer] operational officer. Patients and LRT discuss what it means to be "at peace", what it feels like physically and mentally. Pts are given a canvas and watercolor paint to use and encouraged to draw their idea of a peaceful place. Pts and LRT discuss how they use this in their daily life post discharge. Pts are encouraged to take their canvas home with them as a reminder to find their peaceful place whenever they are feeling depressed, anxious, etc.    Goal Area(s) Addressed:  Patient will identify what it means to experience a "peaceful" emotion. Patient will identify a new coping skill.  Patient will express their emotions through art. Patients will increase communication by talking with LRT and peers while in group.   Affect/Mood: Appropriate   Participation Level: Active and Engaged   Participation Quality: Independent   Behavior: Appropriate, Calm, and Cooperative   Speech/Thought Process: Coherent   Insight: Good   Judgement: Good   Modes of Intervention: Art   Patient Response to Interventions:  Attentive, Engaged, Interested , and Receptive   Education Outcome:  Acknowledges education   Clinical Observations/Individualized Feedback: Tiffany [name] was active in their participation of session activities and group discussion. Pt interacted well with LRT and peers duration of session.    Plan: Continue to engage patient in RT group sessions 2-3x/week.   Jeoffrey BRAVO Celestia, LRT, CTRS 08/31/2023 5:03 PM

## 2023-08-31 NOTE — Progress Notes (Signed)
 [facility] MD Progress Note  08/31/2023 3:21 PM Tiffany [name]  MRN:  [id number]  Tiffany [name] is a 63 y.o. female admitted: Presented to the [facility], [place] 08/13/2023  7:48 PM for after presenting due to a fall later patient exhibit extreme paranoia. She carries the psychiatric diagnoses of PTSD, MDD with and without psychotic features, schizoaffective disorder, and history of polysubstance use and has a past medical history of hypertension, ESRD with HD, congestive heart failure, diabetes, COPD, , stroke. Her current presentation of paranoia, delusional thoughts, auditory and visual hallucinations is most consistent with schizoaffective disorder. She meets criteria for inpatient psychiatric admission based on current symptom Patient is admitted to [facility] unit with Q15 min safety monitoring. Multidisciplinary team approach is offered. Medication management; group/milieu therapy is offered.    Subjective: Chart is reviewed and discussed the case with treatment team.  Patient is very engaging on the unit with other patients in groups.  [name] a lot calmer and cooperative.  She discussed her phone calls with the domestic violence shelter and informs that they do not want her to be taking heavy psychiatric medications.  Provider explained in detail about her paranoia and the need for antipsychotic and that she received the LAI Invega  Sustenna.  Patient did acknowledge her improvement with the medications.  She denies SI/HI/plan and denies hallucinations.  Patient initially informs the provider that trazodone  makes her groggy and she is taking melatonin.  Later on she wants the provider to discontinue melatonin and just have trazodone  available if she needs it.  Patient wanted to discuss Geodon  as an option and provider educated her given her end-stage renal disease and the risk of toxic levels of Geodon  in her system impacting her cardiac status it is an unsafe medication for her.  Patient remains hesitant about discharge  and wants to continue to be monitored in the hospital.  [name] working on domestic violence shelters near [place] area at this point.  Patient was encouraged to work with the social work team on her discharge planning.    Sleep: Fair  Appetite:  Fair  Past Psychiatric History: see h&P Family History:  Family History  Problem Relation Age of Onset   Cancer Mother        lung   Depression Mother    Cancer Father        prostate   Depression Sister    Anxiety disorder Sister    Schizophrenia Sister    Bipolar disorder Sister    Depression Sister    Depression Brother    Heart failure Other        cousin   Social History:  Social History   Substance and Sexual Activity  Alcohol Use No   Alcohol/week: 0.0 standard drinks of alcohol     Social History   Substance and Sexual Activity  Drug Use Not Currently   Types: Cocaine   Comment: 04/10/2016 last used cocaine back in November 2017    Social History   Socioeconomic History   Marital status: Widowed    Spouse name: Not on file   Number of children: 3   Years of education: 12th   Highest education level: 12th grade  Occupational History   Occupation: disabled    Comment: factory production  Tobacco Use   Smoking status: Former    Current packs/day: 0.00    Average packs/day: 1.5 packs/day for 39.1 years (58.6 ttl pk-yrs)    Types: Cigarettes    Start date:  03/13/1977    Quit date: 04/10/2016    Years since quitting: 7.3    Passive exposure: Past   Smokeless tobacco: Never  Vaping Use   Vaping status: Never Used  Substance and Sexual Activity   Alcohol use: No    Alcohol/week: 0.0 standard drinks of alcohol   Drug use: Not Currently    Types: Cocaine    Comment: 04/10/2016 last used cocaine back in November 2017   Sexual activity: Not Currently    Birth control/protection: Surgical  Other Topics Concern   Not on file  Social History Narrative   Has 1 son, [name]   Lives with son and his boyfriend    Her house has ramps and handrails should she ever needs them.    Her mother lives down the street from her and is a good support person in addition to her son.   She drives herself, has private transportation.    Cocaine free since 02/24/16, smoke free since 04/10/16   Dialysis M-W-F   Social Drivers of Health   Financial Resource Strain: Low Risk  (07/11/2022)   Overall Financial Resource Strain (CARDIA)    Difficulty of Paying Living Expenses: Not very hard  Food Insecurity: Food Insecurity Present (08/15/2023)   Hunger Vital Sign    Worried About Running Out of Food in the Last Year: Sometimes true    Ran Out of Food in the Last Year: Sometimes true  Transportation Needs: No Transportation Needs (08/15/2023)   PRAPARE - Administrator, Civil Service (Medical): No    Lack of Transportation (Non-Medical): No  Physical Activity: Inactive (07/11/2022)   Exercise Vital Sign    Days of Exercise per Week: 0 days    Minutes of Exercise per Session: 0 min  Stress: Stress Concern Present (07/11/2022)   [employer] of Occupational Health - Occupational Stress Questionnaire    Feeling of Stress : To some extent  Social Connections: Socially Isolated (06/28/2023)   Social Connection and Isolation Panel    Frequency of Communication with Friends and Family: Three times a week    Frequency of Social Gatherings with Friends and Family: Three times a week    Attends Religious Services: Never    Active Member of Clubs or Organizations: No    Attends Banker Meetings: Never    Marital Status: Widowed   Past Medical History:  Past Medical History:  Diagnosis Date   Abdominal pain    Acute encephalopathy 11/21/2017   Acute exacerbation of CHF (congestive heart failure) (HCC) 03/03/2022   Acute GI bleeding 03/29/2021   Acute metabolic encephalopathy 02/20/2020   Aggressive behavior    Agitation 11/22/2017   Anoxic brain injury (HCC) 09/08/2016   C. Arrest due to  respiratory failure and COPD exacerbation   Anxiety    Arthritis    all over (04/10/2016)   Asthma 10/18/2010   Binge eating disorder    Blood loss anemia 04/24/2011   CAP (community acquired pneumonia) 06/22/2015   Cardiac arrest (HCC) 09/08/2016   PEA   Carotid artery stenosis    1-39% bilateral by dopplers 11/2016   Chronic diastolic (congestive) heart failure (HCC)    Chronic pain syndrome 06/18/2012   Chronic post-traumatic stress disorder (PTSD) 05/27/2018   Chronic respiratory failure with hypoxia and hypercapnia (HCC) 06/22/2015   TRILOGY Vent >AVAPA-ES., Vt target 200-400, Max P 30 , PS max 20 , PS min 6-10 , E Max 6, E Min 4, Rate Auto AVAPS  Rate 2 (titrate for pt comfort) , bleed O2 at 5l/m continuous flow .    Closed displaced fracture of fifth metacarpal bone 03/21/2018   Cocaine use disorder, severe, in sustained remission (HCC) 12/17/2015   Complication of anesthesia    decreased bp, decreased heart rate   COPD (chronic obstructive pulmonary disease) (HCC) 07/08/2014   Delusional disorder, persecutory type (HCC) 06/26/2021   Depression    Diabetic neuropathy (HCC) 04/24/2011   Difficulty with speech 01/24/2018   Disorder of nervous system    Drug abuse (HCC) 11/21/2017   Dyslipidemia 04/24/2011   E. coli UTI 02/20/2020   Elevated troponin 04/28/2012   Emphysema    Encephalopathy 11/21/2017   Essential hypertension 03/22/2016   Facet arthropathy, lumbar 06/12/2022   Fibula fracture 07/10/2016   Frequent falls 10/11/2017   GERD (gastroesophageal reflux disease)    GI bleed 03/30/2021   Gout 04/11/2017   Heart attack (HCC) 1980s   Hematochezia    History of blood transfusion 1994   couldn't stop bleeding from my period   History of drug abuse in remission (HCC) 11/28/2015   Quit in 2017   Hyperlipidemia 03/31/2021   Hyperlipidemia LDL goal <70    Hypocalcemia    Hypokalemia    Hypomagnesemia    Incontinence    Manic depression (HCC)    Morbid  obesity (HCC) 10/18/2010   Obstructive sleep apnea 10/18/2010   On home oxygen  therapy    6L; 24/7 (04/10/2016)   OSA on CPAP    wear mask sometimes (04/10/2016)   Painless rectal bleeding 12/31/2015   Paranoid (HCC)    sometimes; I'm on RX for it (04/10/2016)   Periumbilical abdominal pain    Prolonged Q-T interval on ECG    Rectal bleeding 12/31/2015   Recurrent syncope 07/09/2021   Rhabdomyolysis 06/16/2021   Schizoaffective disorder, bipolar type (HCC) 04/05/2018   Seasonal allergies    Seborrheic keratoses 12/31/2013   Seizures (HCC)    don't know what kind; last one was ~ 1 yr ago (04/10/2016)   Sinus bradycardia    Skin ulcer of sacrum, limited to breakdown of skin (HCC) 03/08/2022   Spondylolisthesis at L3-L4 level 06/12/2022   Stroke (HCC) 1980s   denies residual on 04/10/2016   Thrush 09/19/2013   Type 2 diabetes mellitus (HCC) 10/18/2010    Past Surgical History:  Procedure Laterality Date   A/V FISTULAGRAM N/A 02/01/2023   Procedure: A/V Fistulagram;  Surgeon: Norine Manuelita LABOR, MD;  Location: [facility] INVASIVE CV LAB;  Service: Cardiovascular;  Laterality: N/A;   AV FISTULA PLACEMENT Left 12/21/2022   Procedure: INSERTION OF LEFT ARM ARTERIOVENOUS (AV) GORE-TEX GRAFT;  Surgeon: Magda Debby SAILOR, MD;  Location: [facility] OR;  Service: Vascular;  Laterality: Left;   CESAREAN SECTION  1997   COLONOSCOPY WITH PROPOFOL  N/A 04/01/2021   Procedure: COLONOSCOPY WITH PROPOFOL ;  Surgeon: Rollin Dover, MD;  Location: [facility] ENDOSCOPY;  Service: Gastroenterology;  Laterality: N/A;  Rectal bleeding with drop in hemoglobin to 7.2 g/dL   HERNIA REPAIR     IR CHOLANGIOGRAM EXISTING TUBE  07/20/2016   IR PERC CHOLECYSTOSTOMY  05/10/2016   IR RADIOLOGIST EVAL & MGMT  06/08/2016   IR RADIOLOGIST EVAL & MGMT  06/29/2016   IR SINUS/FIST TUBE CHK-NON GI  07/12/2016   RIGHT/LEFT HEART CATH AND CORONARY ANGIOGRAPHY N/A 06/19/2017   Procedure: RIGHT/LEFT HEART CATH AND CORONARY ANGIOGRAPHY;  Surgeon:  Cherrie Toribio SAUNDERS, MD;  Location: [facility] INVASIVE CV LAB;  Service: Cardiovascular;  Laterality:  N/A;   TIBIA IM NAIL INSERTION Right 07/12/2016   Procedure: INTRAMEDULLARY (IM) NAIL RIGHT TIBIA;  Surgeon: Jerri Kay HERO, MD;  Location: [facility] OR;  Service: Orthopedics;  Laterality: Right;   UMBILICAL HERNIA REPAIR  ~ 1963   that's why I don't have a belly button   VAGINAL HYSTERECTOMY      Current Medications: Current Facility-Administered Medications  Medication Dose Route Frequency Provider Last Rate Last Admin   albuterol  (VENTOLIN  HFA) 108 (90 Base) MCG/ACT inhaler 2 puff  2 puff Inhalation Q6H PRN Mannie Jerel PARAS, NP   2 puff at 08/31/23 1034   amLODipine  (NORVASC ) tablet 5 mg  5 mg Oral BID Mannie Jerel PARAS, NP   5 mg at 08/31/23 1019   atorvastatin  (LIPITOR ) tablet 20 mg  20 mg Oral Daily Mannie Jerel PARAS, NP   20 mg at 08/31/23 1020   calcitRIOL  (ROCALTROL ) capsule 0.5 mcg  0.5 mcg Oral Daily Nico Syme, MD   0.5 mcg at 08/31/23 1020   capsaicin  (ZOSTRIX) 0.025 % cream   Topical BID PRN Onuoha, Chinwendu V, NP   Given at 08/29/23 2125   carvedilol  (COREG ) tablet 12.5 mg  12.5 mg Oral BID Mannie Jerel PARAS, NP   12.5 mg at 08/31/23 1020   Chlorhexidine  Gluconate Cloth 2 % PADS 6 each  6 each Topical Q0600 Tiffany Bald, NP   6 each at 08/25/23 0857   dicyclomine  (BENTYL ) capsule 10 mg  10 mg Oral TID Mannie Jerel PARAS, NP   10 mg at 08/31/23 1019   docusate sodium  (COLACE) capsule 100 mg  100 mg Oral Daily Kristyne Woodring, MD   100 mg at 08/31/23 1019   epoetin  alfa-epbx (RETACRIT ) injection 10,000 Units  10,000 Units Intravenous Q T,Th,Sat-1800 Breeze, Bald, NP   10,000 Units at 08/30/23 0946   escitalopram  (LEXAPRO ) tablet 5 mg  5 mg Oral Daily Mannie Jerel PARAS, NP   5 mg at 08/31/23 1020   ezetimibe  (ZETIA ) tablet 10 mg  10 mg Oral Daily Elice Crigger, MD   10 mg at 08/31/23 1020   ferrous sulfate  tablet 325 mg  325 mg Oral Daily Jontrell Bushong, MD   325 mg at 08/31/23  1020   folic acid  (FOLVITE ) tablet 1 mg  1 mg Oral Daily Arne Schlender, MD   1 mg at 08/31/23 1019   gabapentin  (NEURONTIN ) capsule 100 mg  100 mg Oral QHS Mannie Jerel PARAS, NP   100 mg at 08/30/23 2211   hydrOXYzine  (ATARAX ) tablet 25 mg  25 mg Oral Q6H PRN Kaisen Ackers, MD       metolazone  (ZAROXOLYN ) tablet 2.5 mg  2.5 mg Oral Q M,W,F Sadik Piascik, MD   2.5 mg at 08/31/23 1019   mirtazapine  (REMERON ) tablet 15 mg  15 mg Oral QHS Gursimran Litaker, MD   15 mg at 08/30/23 2210   OLANZapine  (ZYPREXA ) injection 5 mg  5 mg Intramuscular TID PRN Mannie Jerel PARAS, NP       OLANZapine  zydis (ZYPREXA ) disintegrating tablet 5 mg  5 mg Oral TID PRN Mannie Jerel PARAS, NP   5 mg at 08/18/23 2318   pantoprazole  (PROTONIX ) EC tablet 40 mg  40 mg Oral Daily Sahmir Weatherbee, MD   40 mg at 08/31/23 1020   potassium chloride  (KLOR-CON  M) CR tablet 20 mEq  20 mEq Oral BID Tiffany Bald, NP   20 mEq at 08/31/23 1020   torsemide  (DEMADEX ) tablet 40 mg  40 mg Oral BID Mannie Jerel  J, NP   40 mg at 08/31/23 1019   traZODone  (DESYREL ) tablet 50 mg  50 mg Oral QHS Adiel Mcnamara, MD        Lab Results:  Results for orders placed or performed during the hospital encounter of 08/15/23 (from the past 48 hours)  CBC     Status: Abnormal   Collection Time: 08/30/23  7:00 AM  Result Value Ref Range   WBC 6.5 4.0 - 10.5 K/uL   RBC 2.65 (L) 3.87 - 5.11 MIL/uL   Hemoglobin 7.6 (L) 12.0 - 15.0 g/dL   HCT 76.0 (L) 63.9 - 53.9 %   MCV 90.2 80.0 - 100.0 fL   MCH 28.7 26.0 - 34.0 pg   MCHC 31.8 30.0 - 36.0 g/dL   RDW 83.5 (H) 88.4 - 84.4 %   Platelets 258 150 - 400 K/uL   nRBC 0.0 0.0 - 0.2 %    Comment: Performed at [facility], [address] [place], [place] [address]  Renal function panel     Status: Abnormal   Collection Time: 08/30/23  7:00 AM  Result Value Ref Range   Sodium 130 (L) 135 - 145 mmol/L   Potassium 2.7 (LL) 3.5 - 5.1 mmol/L    Comment: CRITICAL RESULT CALLED TO, READ BACK BY  AND VERIFIED WITH MITSEY OAKLEY@0901  ON 08/30/23 BY HKP    Chloride 91 (L) 98 - 111 mmol/L   CO2 27 22 - 32 mmol/L   Glucose, Bld 102 (H) 70 - 99 mg/dL    Comment: Glucose reference range applies only to samples taken after fasting for at least 8 hours.   BUN 63 (H) 8 - 23 mg/dL   Creatinine, Ser 5.99 (H) 0.44 - 1.00 mg/dL   Calcium  9.4 8.9 - 10.3 mg/dL   Phosphorus 5.2 (H) 2.5 - 4.6 mg/dL   Albumin  3.2 (L) 3.5 - 5.0 g/dL   GFR, Estimated 12 (L) >60 mL/min    Comment: (NOTE) Calculated using the CKD-EPI Creatinine Equation (2021)    Anion gap 12 5 - 15    Comment: Performed at [facility], [address], [place], [place] [address]   *Note: Due to a large number of results and/or encounters for the requested time period, some results have not been displayed. A complete set of results can be found in Results Review.      Blood Alcohol level:  Lab Results  Component Value Date   [facility] <15 05/31/2023   ETH <10 03/10/2023    Metabolic Disorder Labs: Lab Results  Component Value Date   HGBA1C 5.0 01/05/2022   MPG 99.67 06/17/2021   MPG 105.41 07/23/2020   No results found for: PROLACTIN Lab Results  Component Value Date   CHOL 151 11/14/2022   TRIG 100 11/14/2022   HDL 65 11/14/2022   CHOLHDL 2.3 11/14/2022   VLDL 20 11/14/2022   LDLCALC 66 11/14/2022   LDLCALC 54 08/05/2018    Physical Findings: AIMS:  , ,  ,  ,    CIWA:    COWS:      Psychiatric Specialty Exam:  Presentation  General Appearance:  Appropriate for Environment; Casual  Eye Contact: Fair  Speech: Clear and Coherent  Speech Volume: Normal    Mood and Affect  Mood: Normal Affect: Euthymic  Thought Process  Thought Processes: Disorganized But improving Descriptions of Associations:Intact  Orientation:Partial  Thought Content:Paranoid Ideation  Hallucinations: Auditory hallucinations  Ideas of Reference:Paranoia; Delusions  Suicidal Thoughts:  Denies  Homicidal Thoughts: Denies   Sensorium  Memory: Immediate Fair; Recent Fair; Remote Poor  Judgment: Impaired  Insight: Shallow        Physical Exam: Physical Exam Vitals and nursing note reviewed.    ROS Blood pressure 115/72, pulse 79, temperature 98 F (36.7 C), resp. rate 18, weight 86.7 kg, SpO2 100%. Body mass index is 34.96 kg/m.  Diagnosis: Principal Problem:   Schizoaffective disorder [facility])   Clinical Decision Making: Patient with history of Schizoaffective disorder Bipolar type, end-stage renal disease admitted for worsening paranoia and delusions about people abusing her, out to get her.  Her paranoia worsened to the point where she refused to leave the house and skipped her dialysis for 5 days which led up to medical emergency.  Patient needs inpatient psychiatric hospitalization for further stabilization.   Treatment Plan Summary:   Safety and Monitoring:             -- Voluntary admission to inpatient psychiatric unit for safety, stabilization and treatment             -- Daily contact with patient to assess and evaluate symptoms and progress in treatment             -- Patient's case to be discussed in multi-disciplinary team meeting             -- Observation Level: q15 minute checks             -- Vital signs:  q12 hours             -- Precautions: suicide, elopement, and assault   2. Psychiatric Diagnoses and Treatment:               Discontinued Zyprexa .    Invega  LAI 08/26/2023 Invega  9 mg daily-discontinued after the LAI to minimize daytime sedation. Lexapro  5 mg daily-Will titrate it up to 10 mg daily Prazosin    Patient has chronic delusions but exacerbated to the extent of not going to dialysis-   Will consider adding saphris  which is her home meds   -- The risks/benefits/side-effects/alternatives to this medication were discussed in detail with the patient and time was given for questions. The patient consents to medication  trial.                -- Metabolic profile and EKG monitoring obtained while on an atypical antipsychotic (BMI: Lipid Panel: HbgA1c: QTc:)              -- Encouraged patient to participate in unit milieu and in scheduled group therapies                            3. Medical Issues Being Addressed:   ESRD: consulted nephrology for dialysis 4. Discharge Planning:   -- Social work and case management to assist with discharge planning and identification of hospital follow-up needs prior to discharge  -- Estimated LOS: 3-4 days  Bert Givans, MD 08/31/2023, 3:21 PM

## 2023-08-31 NOTE — Group Note (Signed)
 Physical/Occupational Therapy Group Note  Group Topic: UE Therex   Group Date: 08/31/2023 Start Time: 1300 End Time: 1350 Facilitators: Clive Warren CROME, OT     Group Description: Group instructed in series of upper extremities exercises, aimed to promote strength, flexibility, range of motion and functional endurance.  Patients provided cuing for proper mechanics and proper pace of exercise; exercises adjusted as necessary for individualized patient needs.  Patient also engaged in cognitive components throughout session, working to integrate attention to task, command following, turn-taking and appropriate social interaction throughout session.  Allowed to ask questions as appropriate, and encouraged to identify specific exercises that they could complete independently outside of group sessions.   Therapeutic Goal(s): Demonstrate appropriate performance of upper extremity exercises to promote strength, flexibility, range of motion and functional endurance Identify 2-3 specific upper extremity exercises to complete as home exercise program outside of group session   Individual Participation: Pt pleasant and attentive throughout. Was able to identify when she needed a brief rest break. MOD multimodal cues to complete exercises. Some modifications made 2/2 wheelchair. Contributed to conversation with and without prompting. Verbalized plan to implement exercises outside of session.  Participation Level: Moderate   Participation Quality: Moderate Cues   Behavior: Alert, Appropriate, Attentive , Calm, and On-looking   Speech/Thought Process: Coherent, Organized, and Relevant   Affect/Mood: Appropriate, Flat, and Stable    Insight: Fair   Judgement: Fair   Modes of Intervention: Activity, Clarification, Discussion, Education, Exploration, Problem-solving, Rapport Building, Socialization, and Support  Patient Response to Interventions:  Attentive, Engaged, and Receptive   Plan: Continue to  engage patient in PT/OT groups 1 - 2x/week.  Gaylin Osoria R., MPH, MS, OTR/L ascom [phone] 08/31/23, 5:10 PM

## 2023-08-31 NOTE — Group Note (Signed)
 Date:  08/31/2023 Time:  10:36 PM  Group Topic/Focus:  Self Care:   The focus of this group is to help patients understand the importance of self-care in order to improve or restore emotional, physical, spiritual, interpersonal, and financial health. MHT went over sleep hygiene along with rules and expectations for our unit while also identifying who the nurses and techs are.    Participation Level:  Minimal  Participation Quality:  Appropriate  Affect:  Appropriate  Cognitive:  Appropriate  Insight: Appropriate  Engagement in Group:  Engaged  Modes of Intervention:  Discussion  Additional Comments:    Leigh VEAR Pais 08/31/2023, 10:36 PM

## 2023-08-31 NOTE — Plan of Care (Signed)
   Problem: Activity: Goal: Will verbalize the importance of balancing activity with adequate rest periods Outcome: Progressing   Problem: Education: Goal: Knowledge of the prescribed therapeutic regimen will improve Outcome: Progressing

## 2023-08-31 NOTE — Progress Notes (Signed)
   08/31/23 1500  Psych Admission Type (Psych Patients Only)  Admission Status Voluntary  Psychosocial Assessment  Patient Complaints Anxiety  Eye Contact Fair  Facial Expression Animated  Affect Appropriate to circumstance  Speech Logical/coherent  Interaction Assertive  Motor Activity Other (Comment) (in w/c)  Appearance/Hygiene In scrubs  Behavior Characteristics Cooperative  Mood Pleasant  Thought Process  Coherency WDL  Content WDL  Delusions None reported or observed  Perception WDL  Hallucination None reported or observed  Judgment Impaired  Confusion Mild  Danger to Self  Current suicidal ideation? Denies  Danger to Others  Danger to Others None reported or observed

## 2023-08-31 NOTE — Plan of Care (Signed)
  Problem: Activity: Goal: Will verbalize the importance of balancing activity with adequate rest periods Outcome: Progressing   Problem: Education: Goal: Will be free of psychotic symptoms Outcome: Progressing Goal: Knowledge of the prescribed therapeutic regimen will improve Outcome: Progressing   Problem: Coping: Goal: Coping ability will improve Outcome: Progressing Goal: Will verbalize feelings Outcome: Progressing   Problem: Health Behavior/Discharge Planning: Goal: Compliance with prescribed medication regimen will improve Outcome: Progressing   

## 2023-08-31 NOTE — Group Note (Signed)
 Date:  08/31/2023 Time:  11:38 AM  Group Topic/Focus:  Rediscovering Joy:   The focus of this group is to explore various ways to relieve stress in a positive manner.    Participation Level:  Active  Participation Quality:  Appropriate  Affect:  Appropriate  Cognitive:  Appropriate  Insight: Appropriate  Engagement in Group:  Engaged  Modes of Intervention:  Activity  Additional Comments:    Camellia HERO Kylan Liberati 08/31/2023, 11:38 AM

## 2023-08-31 NOTE — Progress Notes (Signed)
   08/30/23 2100  Psych Admission Type (Psych Patients Only)  Admission Status Voluntary  Psychosocial Assessment  Patient Complaints Anxiety  Eye Contact Fair  Facial Expression Animated  Affect Appropriate to circumstance  Speech Logical/coherent  Interaction Assertive  Motor Activity Slow  Appearance/Hygiene In scrubs  Behavior Characteristics Cooperative  Mood Pleasant  Thought Process  Coherency WDL  Content WDL  Delusions None reported or observed  Perception WDL  Hallucination None reported or observed  Judgment Impaired  Confusion Mild  Danger to Self  Current suicidal ideation? Denies

## 2023-09-01 MED ORDER — OLANZAPINE 5 MG PO TABS
2.5000 mg | ORAL_TABLET | Freq: Once | ORAL | Status: AC
  Administered 2023-09-01: 2.5 mg via ORAL
  Filled 2023-09-01: qty 0.5

## 2023-09-01 MED ORDER — ESCITALOPRAM OXALATE 10 MG PO TABS
10.0000 mg | ORAL_TABLET | Freq: Every day | ORAL | Status: DC
  Administered 2023-09-01 – 2023-09-07 (×7): 10 mg via ORAL
  Filled 2023-09-01 (×7): qty 1

## 2023-09-01 NOTE — Plan of Care (Signed)
   Problem: Activity: Goal: Will verbalize the importance of balancing activity with adequate rest periods Outcome: Progressing   Problem: Education: Goal: Will be free of psychotic symptoms Outcome: Progressing Goal: Knowledge of the prescribed therapeutic regimen will improve Outcome: Progressing

## 2023-09-01 NOTE — Progress Notes (Addendum)
 Called to get report on pt and Behavioral nurse stated pt is refusing to come to Dialysis today. Notified Press photographer and Nephrologist Kollorapati was notified.

## 2023-09-01 NOTE — Progress Notes (Signed)
 Dialysis was ready to receive patient at around 1030. Patient stated, I am not paying them any mind, forget dialysis! They were supposed to start earlier than this. Patient encouraged to stick with the treatment plan but declined. MD informed.

## 2023-09-01 NOTE — Group Note (Signed)
 Date:  09/01/2023 Time:  12:11 PM  Group Topic/Focus:  Making Healthy Choices:   The focus of this group is to help patients identify negative/unhealthy choices they were using prior to admission and identify positive/healthier coping strategies to replace them upon discharge.    Participation Level:  Minimal  Participation Quality:  Appropriate and Resistant  Affect:  Appropriate  Cognitive:  Appropriate  Insight: Appropriate  Engagement in Group:  None  Modes of Intervention:  Problem-solving  Additional Comments:  n/a  Sarissa Dern 09/01/2023, 12:11 PM

## 2023-09-01 NOTE — Progress Notes (Signed)
 2nd attempt made at 1115 to see if patient would reconsider dialysis today. Patient continued to decline and requested to receive morning medications instead.

## 2023-09-01 NOTE — Progress Notes (Signed)
   09/01/23 0526  Sleep (Behavioral Health Patients Only)  Calculate sleep? (Click Yes once per 24 hr at 0600 safety check) Yes  Documented sleep last 24 hours 5.75   Patient ambulating in W/C with no assistanc. Denies SI/HI/A/VH and verbally contracts for safety. Refused HS Amlodipine  5 mg BP 129/62 HR 86. Requested for Atarax  for anxiety medications given at HS and reported effective. Support provided as needed.

## 2023-09-01 NOTE — Progress Notes (Signed)
   09/01/23 1200  Psych Admission Type (Psych Patients Only)  Admission Status Voluntary  Psychosocial Assessment  Patient Complaints Irritability  Eye Contact Fair  Facial Expression Animated  Affect Appropriate to circumstance  Speech Logical/coherent  Interaction Assertive  Motor Activity Other (Comment) (in w/c)  Appearance/Hygiene In scrubs  Behavior Characteristics Resistant to care  Mood Irritable  Thought Process  Coherency WDL  Content WDL  Delusions None reported or observed  Perception WDL  Hallucination None reported or observed  Judgment Impaired  Confusion Mild  Danger to Self  Current suicidal ideation? Denies  Danger to Others  Danger to Others None reported or observed

## 2023-09-01 NOTE — Progress Notes (Signed)
 Patient refused to complete dialysis today. [name] on potassium supplementation. Will reevaluate for dialysis tomorrow/Monday.
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
## 2023-09-01 NOTE — Group Note (Signed)
 Date:  09/01/2023 Time:  10:56 PM  Group Topic/Focus:  Dimensions of Wellness:   The focus of this group is to introduce the topic of wellness and discuss the role each dimension of wellness plays in total health. Emotional Education:   The focus of this group is to discuss what feelings/emotions are, and how they are experienced. Managing Feelings:   The focus of this group is to identify what feelings patients have difficulty handling and develop a plan to handle them in a healthier way upon discharge.    Participation Level:  PT DID NOT ATTEND GROUP.  Participation Quality:  Appropriate and Attentive  Affect:  Appropriate  Cognitive:  Alert and Appropriate  Insight: Appropriate and Good  Engagement in Group:  Engaged and Supportive  Modes of Intervention:  Discussion, Socialization, and Support  Additional Comments:  PT did not attend group. Will motivate pt to attend group with peers  Brad GORMAN Ryder 09/01/2023, 10:56 PM

## 2023-09-01 NOTE — Progress Notes (Signed)
 [facility] MD Progress Note  09/01/2023 8:07 PM Tiffany [name]  MRN:  [id number]  Tiffany [name] is a 63 y.o. female admitted: Presented to the [facility] 08/13/2023  7:48 PM for after presenting due to a fall later patient exhibit extreme paranoia. She carries the psychiatric diagnoses of PTSD, MDD with and without psychotic features, schizoaffective disorder, and history of polysubstance use and has a past medical history of hypertension, ESRD with HD, congestive heart failure, diabetes, COPD, , stroke. Her current presentation of paranoia, delusional thoughts, auditory and visual hallucinations is most consistent with schizoaffective disorder. She meets criteria for inpatient psychiatric admission based on current symptom Patient is admitted to [facility] unit with Q15 min safety monitoring. Multidisciplinary team approach is offered. Medication management; group/milieu therapy is offered.    Subjective: Chart is reviewed and discussed the case with treatment team.  Patient is very engaging on the unit with other patients in groups.  [name] a lot calmer and cooperative.     Patient is reportedly refusing her dialysis and continues to be delusional at times and refused to take some of her medication and not participating at times.   Sleep: Fair  Appetite:  Fair  Past Psychiatric History: see h&P Family History:  Family History  Problem Relation Age of Onset   Cancer Mother        lung   Depression Mother    Cancer Father        prostate   Depression Sister    Anxiety disorder Sister    Schizophrenia Sister    Bipolar disorder Sister    Depression Sister    Depression Brother    Heart failure Other        cousin   Social History:  Social History   Substance and Sexual Activity  Alcohol Use No   Alcohol/week: 0.0 standard drinks of alcohol     Social History   Substance and Sexual Activity  Drug Use Not Currently   Types: Cocaine   Comment: 04/10/2016 last used cocaine back in  November 2017    Social History   Socioeconomic History   Marital status: Widowed    Spouse name: Not on file   Number of children: 3   Years of education: 12th   Highest education level: 12th grade  Occupational History   Occupation: disabled    Comment: factory production  Tobacco Use   Smoking status: Former    Current packs/day: 0.00    Average packs/day: 1.5 packs/day for 39.1 years (58.6 ttl pk-yrs)    Types: Cigarettes    Start date: 03/13/1977    Quit date: 04/10/2016    Years since quitting: 7.3    Passive exposure: Past   Smokeless tobacco: Never  Vaping Use   Vaping status: Never Used  Substance and Sexual Activity   Alcohol use: No    Alcohol/week: 0.0 standard drinks of alcohol   Drug use: Not Currently    Types: Cocaine    Comment: 04/10/2016 last used cocaine back in November 2017   Sexual activity: Not Currently    Birth control/protection: Surgical  Other Topics Concern   Not on file  Social History Narrative   Has 1 son, Tiffany   Lives with son and his boyfriend   Her house has ramps and handrails should she ever needs them.    Her mother lives down the street from her and is a good support person in addition to her son.   She  drives herself, has private transportation.    Cocaine free since 02/24/16, smoke free since 04/10/16   Dialysis M-W-F   Social Drivers of Health   Financial Resource Strain: Low Risk  (07/11/2022)   Overall Financial Resource Strain (CARDIA)    Difficulty of Paying Living Expenses: Not very hard  Food Insecurity: Food Insecurity Present (08/15/2023)   Hunger Vital Sign    Worried About Running Out of Food in the Last Year: Sometimes true    Ran Out of Food in the Last Year: Sometimes true  Transportation Needs: No Transportation Needs (08/15/2023)   PRAPARE - Administrator, Civil Service (Medical): No    Lack of Transportation (Non-Medical): No  Physical Activity: Inactive (07/11/2022)   Exercise Vital Sign     Days of Exercise per Week: 0 days    Minutes of Exercise per Session: 0 min  Stress: Stress Concern Present (07/11/2022)   [employer] of Occupational Health - Occupational Stress Questionnaire    Feeling of Stress : To some extent  Social Connections: Socially Isolated (06/28/2023)   Social Connection and Isolation Panel    Frequency of Communication with Friends and Family: Three times a week    Frequency of Social Gatherings with Friends and Family: Three times a week    Attends Religious Services: Never    Active Member of Clubs or Organizations: No    Attends Banker Meetings: Never    Marital Status: Widowed   Past Medical History:  Past Medical History:  Diagnosis Date   Abdominal pain    Acute encephalopathy 11/21/2017   Acute exacerbation of CHF (congestive heart failure) (HCC) 03/03/2022   Acute GI bleeding 03/29/2021   Acute metabolic encephalopathy 02/20/2020   Aggressive behavior    Agitation 11/22/2017   Anoxic brain injury (HCC) 09/08/2016   C. Arrest due to respiratory failure and COPD exacerbation   Anxiety    Arthritis    all over (04/10/2016)   Asthma 10/18/2010   Binge eating disorder    Blood loss anemia 04/24/2011   CAP (community acquired pneumonia) 06/22/2015   Cardiac arrest (HCC) 09/08/2016   PEA   Carotid artery stenosis    1-39% bilateral by dopplers 11/2016   Chronic diastolic (congestive) heart failure (HCC)    Chronic pain syndrome 06/18/2012   Chronic post-traumatic stress disorder (PTSD) 05/27/2018   Chronic respiratory failure with hypoxia and hypercapnia (HCC) 06/22/2015   TRILOGY Vent >AVAPA-ES., Vt target 200-400, Max P 30 , PS max 20 , PS min 6-10 , E Max 6, E Min 4, Rate Auto AVAPS Rate 2 (titrate for pt comfort) , bleed O2 at 5l/m continuous flow .    Closed displaced fracture of fifth metacarpal bone 03/21/2018   Cocaine use disorder, severe, in sustained remission (HCC) 12/17/2015   Complication of anesthesia     decreased bp, decreased heart rate   COPD (chronic obstructive pulmonary disease) (HCC) 07/08/2014   Delusional disorder, persecutory type (HCC) 06/26/2021   Depression    Diabetic neuropathy (HCC) 04/24/2011   Difficulty with speech 01/24/2018   Disorder of nervous system    Drug abuse (HCC) 11/21/2017   Dyslipidemia 04/24/2011   E. coli UTI 02/20/2020   Elevated troponin 04/28/2012   Emphysema    Encephalopathy 11/21/2017   Essential hypertension 03/22/2016   Facet arthropathy, lumbar 06/12/2022   Fibula fracture 07/10/2016   Frequent falls 10/11/2017   GERD (gastroesophageal reflux disease)    GI bleed 03/30/2021  Gout 04/11/2017   Heart attack (HCC) 1980s   Hematochezia    History of blood transfusion 1994   couldn't stop bleeding from my period   History of drug abuse in remission (HCC) 11/28/2015   Quit in 2017   Hyperlipidemia 03/31/2021   Hyperlipidemia LDL goal <70    Hypocalcemia    Hypokalemia    Hypomagnesemia    Incontinence    Manic depression (HCC)    Morbid obesity (HCC) 10/18/2010   Obstructive sleep apnea 10/18/2010   On home oxygen  therapy    6L; 24/7 (04/10/2016)   OSA on CPAP    wear mask sometimes (04/10/2016)   Painless rectal bleeding 12/31/2015   Paranoid (HCC)    sometimes; I'm on RX for it (04/10/2016)   Periumbilical abdominal pain    Prolonged Q-T interval on ECG    Rectal bleeding 12/31/2015   Recurrent syncope 07/09/2021   Rhabdomyolysis 06/16/2021   Schizoaffective disorder, bipolar type (HCC) 04/05/2018   Seasonal allergies    Seborrheic keratoses 12/31/2013   Seizures (HCC)    don't know what kind; last one was ~ 1 yr ago (04/10/2016)   Sinus bradycardia    Skin ulcer of sacrum, limited to breakdown of skin (HCC) 03/08/2022   Spondylolisthesis at L3-L4 level 06/12/2022   Stroke (HCC) 1980s   denies residual on 04/10/2016   Thrush 09/19/2013   Type 2 diabetes mellitus (HCC) 10/18/2010    Past Surgical History:   Procedure Laterality Date   A/V FISTULAGRAM N/A 02/01/2023   Procedure: A/V Fistulagram;  Surgeon: Norine Manuelita LABOR, MD;  Location: [facility] INVASIVE CV LAB;  Service: Cardiovascular;  Laterality: N/A;   AV FISTULA PLACEMENT Left 12/21/2022   Procedure: INSERTION OF LEFT ARM ARTERIOVENOUS (AV) GORE-TEX GRAFT;  Surgeon: Magda Debby SAILOR, MD;  Location: [facility] OR;  Service: Vascular;  Laterality: Left;   CESAREAN SECTION  1997   COLONOSCOPY WITH PROPOFOL  N/A 04/01/2021   Procedure: COLONOSCOPY WITH PROPOFOL ;  Surgeon: Rollin Dover, MD;  Location: [facility] ENDOSCOPY;  Service: Gastroenterology;  Laterality: N/A;  Rectal bleeding with drop in hemoglobin to 7.2 g/dL   HERNIA REPAIR     IR CHOLANGIOGRAM EXISTING TUBE  07/20/2016   IR PERC CHOLECYSTOSTOMY  05/10/2016   IR RADIOLOGIST EVAL & MGMT  06/08/2016   IR RADIOLOGIST EVAL & MGMT  06/29/2016   IR SINUS/FIST TUBE CHK-NON GI  07/12/2016   RIGHT/LEFT HEART CATH AND CORONARY ANGIOGRAPHY N/A 06/19/2017   Procedure: RIGHT/LEFT HEART CATH AND CORONARY ANGIOGRAPHY;  Surgeon: Cherrie Toribio SAUNDERS, MD;  Location: [facility] INVASIVE CV LAB;  Service: Cardiovascular;  Laterality: N/A;   TIBIA IM NAIL INSERTION Right 07/12/2016   Procedure: INTRAMEDULLARY (IM) NAIL RIGHT TIBIA;  Surgeon: Jerri Kay HERO, MD;  Location: [facility] OR;  Service: Orthopedics;  Laterality: Right;   UMBILICAL HERNIA REPAIR  ~ 1963   that's why I don't have a belly button   VAGINAL HYSTERECTOMY      Current Medications: Current Facility-Administered Medications  Medication Dose Route Frequency Provider Last Rate Last Admin   albuterol  (VENTOLIN  HFA) 108 (90 Base) MCG/ACT inhaler 2 puff  2 puff Inhalation Q6H PRN Mannie Jerel PARAS, NP   2 puff at 09/01/23 1129   amLODipine  (NORVASC ) tablet 5 mg  5 mg Oral BID Mannie Jerel PARAS, NP   5 mg at 09/01/23 1129   atorvastatin  (LIPITOR ) tablet 20 mg  20 mg Oral Daily Mannie Jerel PARAS, NP   20 mg at 09/01/23 1129   calcitRIOL  (ROCALTROL )  capsule 0.5 mcg  0.5 mcg  Oral Daily Jadapalle, Sree, MD   0.5 mcg at 09/01/23 1130   capsaicin  (ZOSTRIX) 0.025 % cream   Topical BID PRN Onuoha, Chinwendu V, NP   Given at 08/29/23 2125   carvedilol  (COREG ) tablet 12.5 mg  12.5 mg Oral BID Mannie Jerel PARAS, NP   12.5 mg at 09/01/23 1130   Chlorhexidine  Gluconate Cloth 2 % PADS 6 each  6 each Topical Q0600 Druscilla Bald, NP   6 each at 09/01/23 9346   dicyclomine  (BENTYL ) capsule 10 mg  10 mg Oral TID Mannie Jerel PARAS, NP   10 mg at 09/01/23 1743   docusate sodium  (COLACE) capsule 100 mg  100 mg Oral Daily Jadapalle, Sree, MD   100 mg at 09/01/23 1130   epoetin  alfa-epbx (RETACRIT ) injection 10,000 Units  10,000 Units Intravenous Q T,Th,Sat-1800 Breeze, Bald, NP   10,000 Units at 08/30/23 0946   escitalopram  (LEXAPRO ) tablet 10 mg  10 mg Oral Daily Jadapalle, Sree, MD   10 mg at 09/01/23 1129   ezetimibe  (ZETIA ) tablet 10 mg  10 mg Oral Daily Jadapalle, Sree, MD   10 mg at 09/01/23 1131   ferrous sulfate  tablet 325 mg  325 mg Oral Daily Jadapalle, Sree, MD   325 mg at 09/01/23 1129   folic acid  (FOLVITE ) tablet 1 mg  1 mg Oral Daily Jadapalle, Sree, MD   1 mg at 09/01/23 1130   gabapentin  (NEURONTIN ) capsule 100 mg  100 mg Oral QHS Mannie Jerel PARAS, NP   100 mg at 08/31/23 2122   heparin  injection 2,200 Units  25 Units/kg Dialysis PRN Druscilla Bald, NP       heparin  injection 2,200 Units  25 Units/kg Dialysis PRN Druscilla Bald, NP       hydrOXYzine  (ATARAX ) tablet 25 mg  25 mg Oral Q6H PRN Jadapalle, Sree, MD   25 mg at 08/31/23 2122   metolazone  (ZAROXOLYN ) tablet 2.5 mg  2.5 mg Oral Q M,W,F Jadapalle, Sree, MD   2.5 mg at 08/31/23 1019   mirtazapine  (REMERON ) tablet 15 mg  15 mg Oral QHS Jadapalle, Sree, MD   15 mg at 08/31/23 2122   OLANZapine  (ZYPREXA ) injection 5 mg  5 mg Intramuscular TID PRN Mannie Jerel PARAS, NP       OLANZapine  zydis (ZYPREXA ) disintegrating tablet 5 mg  5 mg Oral TID PRN Mannie Jerel PARAS, NP   5 mg at 08/18/23 2318   pantoprazole   (PROTONIX ) EC tablet 40 mg  40 mg Oral Daily Jadapalle, Sree, MD   40 mg at 09/01/23 1129   potassium chloride  (KLOR-CON  M) CR tablet 20 mEq  20 mEq Oral BID Druscilla Bald, NP   20 mEq at 09/01/23 1131   torsemide  (DEMADEX ) tablet 40 mg  40 mg Oral BID Mannie Jerel PARAS, NP   40 mg at 09/01/23 1743   traZODone  (DESYREL ) tablet 50 mg  50 mg Oral QHS Jadapalle, Sree, MD   50 mg at 08/31/23 2122    Lab Results:  No results found. However, due to the size of the patient record, not all encounters were searched. Please check Results Review for a complete set of results.     Blood Alcohol level:  Lab Results  Component Value Date   [facility] <15 05/31/2023   ETH <10 03/10/2023    Metabolic Disorder Labs: Lab Results  Component Value Date   HGBA1C 5.0 01/05/2022   MPG 99.67 06/17/2021   MPG 105.41 07/23/2020  No results found for: PROLACTIN Lab Results  Component Value Date   CHOL 151 11/14/2022   TRIG 100 11/14/2022   HDL 65 11/14/2022   CHOLHDL 2.3 11/14/2022   VLDL 20 11/14/2022   LDLCALC 66 11/14/2022   LDLCALC 54 08/05/2018    Physical Findings: AIMS:  , ,  ,  ,    CIWA:    COWS:      Psychiatric Specialty Exam:  Presentation  General Appearance:  Appropriate for Environment; Casual  Eye Contact: Fair  Speech: Clear and Coherent  Speech Volume: Normal    Mood and Affect  Mood: Normal Affect: Euthymic  Thought Process  Thought Processes: Disorganized But improving Descriptions of Associations:Intact  Orientation:Partial  Thought Content:Paranoid Ideation  Hallucinations: Auditory hallucinations  Ideas of Reference:Paranoia; Delusions  Suicidal Thoughts: Denies  Homicidal Thoughts: Denies   Sensorium  Memory: Immediate Fair; Recent Fair; Remote Poor  Judgment: Impaired  Insight: Shallow        Physical Exam: Physical Exam Vitals and nursing note reviewed.    ROS Blood pressure (!) 129/105, pulse 80, temperature  97.8 F (36.6 C), resp. rate 16, weight 86.7 kg, SpO2 94%. Body mass index is 34.96 kg/m.  Diagnosis: Principal Problem:   Schizoaffective disorder [facility])   Clinical Decision Making: Patient with history of Schizoaffective disorder Bipolar type, end-stage renal disease admitted for worsening paranoia and delusions about people abusing her, out to get her.  Her paranoia worsened to the point where she refused to leave the house and skipped her dialysis for 5 days which led up to medical emergency.  Patient needs inpatient psychiatric hospitalization for further stabilization.   Treatment Plan Summary:   Safety and Monitoring:             -- Voluntary admission to inpatient psychiatric unit for safety, stabilization and treatment             -- Daily contact with patient to assess and evaluate symptoms and progress in treatment             -- Patient's case to be discussed in multi-disciplinary team meeting             -- Observation Level: q15 minute checks             -- Vital signs:  q12 hours             -- Precautions: suicide, elopement, and assault   2. Psychiatric Diagnoses and Treatment:               Discontinued Zyprexa .    Invega  LAI 08/26/2023 Invega  9 mg daily-discontinued after the LAI to minimize daytime sedation. Lexapro  5 mg daily-Will titrate it up to 10 mg daily Prazosin    Patient has chronic delusions but exacerbated to the extent of not going to dialysis-   Will consider adding saphris  which is her home meds   -- The risks/benefits/side-effects/alternatives to this medication were discussed in detail with the patient and time was given for questions. The patient consents to medication trial.                -- Metabolic profile and EKG monitoring obtained while on an atypical antipsychotic (BMI: Lipid Panel: HbgA1c: QTc:)              -- Encouraged patient to participate in unit milieu and in scheduled group therapies  3. Medical Issues  Being Addressed:   ESRD: consulted nephrology for dialysis 4. Discharge Planning:   -- Social work and case management to assist with discharge planning and identification of hospital follow-up needs prior to discharge  -- Estimated LOS: 3-4 days  Millie JONELLE Manners, MD 09/01/2023, 8:07 PM

## 2023-09-01 NOTE — Plan of Care (Signed)
   Problem: Education: Goal: Utilization of techniques to improve thought processes will improve Outcome: Progressing Goal: Knowledge of the prescribed therapeutic regimen will improve Outcome: Progressing

## 2023-09-01 NOTE — Progress Notes (Signed)
  Called to get report on pt and Behavioral nurse stated pt is refusing to come to Dialysis today. Notified Press photographer and Nephrologist was notified.   GEANNIE Sar LPN Kidney Dialysis Unit

## 2023-09-02 LAB — COMPREHENSIVE METABOLIC PANEL WITH GFR
ALT: 11 U/L (ref 0–44)
AST: 16 U/L (ref 15–41)
Albumin: 3.5 g/dL (ref 3.5–5.0)
Alkaline Phosphatase: 115 U/L (ref 38–126)
Anion gap: 16 — ABNORMAL HIGH (ref 5–15)
BUN: 79 mg/dL — ABNORMAL HIGH (ref 8–23)
CO2: 20 mmol/L — ABNORMAL LOW (ref 22–32)
Calcium: 9.6 mg/dL (ref 8.9–10.3)
Chloride: 96 mmol/L — ABNORMAL LOW (ref 98–111)
Creatinine, Ser: 4.25 mg/dL — ABNORMAL HIGH (ref 0.44–1.00)
GFR, Estimated: 11 mL/min — ABNORMAL LOW (ref >60)
Glucose, Bld: 106 mg/dL — ABNORMAL HIGH (ref 70–99)
Potassium: 3.4 mmol/L — ABNORMAL LOW (ref 3.5–5.1)
Sodium: 132 mmol/L — ABNORMAL LOW (ref 135–145)
Total Bilirubin: 0.6 mg/dL (ref 0.0–1.2)
Total Protein: 7.3 g/dL (ref 6.5–8.1)

## 2023-09-02 MED ORDER — MELATONIN 5 MG PO TABS
10.0000 mg | ORAL_TABLET | Freq: Every day | ORAL | Status: DC
  Administered 2023-09-02 – 2023-09-06 (×5): 10 mg via ORAL
  Filled 2023-09-02 (×5): qty 2

## 2023-09-02 MED ORDER — TRAZODONE HCL 50 MG PO TABS: 50.0000 mg | ORAL_TABLET | Freq: Every evening | ORAL | Status: DC | PRN

## 2023-09-02 NOTE — Group Note (Signed)
 Date:  09/02/2023 Time:  9:44 PM  Group Topic/Focus:  Wrap-Up Group:   The focus of this group is to help patients review their daily goal of treatment and discuss progress on daily workbooks.    Participation Level:  Active  Participation Quality:  Appropriate  Affect:  Appropriate  Cognitive:  Alert  Insight: Appropriate  Engagement in Group:  Engaged  Modes of Intervention:  Discussion  Additional Comments:    Tiffany [name] 09/02/2023, 9:44 PM

## 2023-09-02 NOTE — Group Note (Signed)
 Date:  09/02/2023 Time:  1:07 PM  Group Topic/Focus:  Making Healthy Choices:   The focus of this group is to help patients identify negative/unhealthy choices they were using prior to admission and identify positive/healthier coping strategies to replace them upon discharge.    Participation Level:  Active  Participation Quality:  Appropriate  Affect:  Appropriate  Cognitive:  Appropriate  Insight: Good  Engagement in Group:  Engaged  Modes of Intervention:  Discussion  Additional Comments:  N/A  [name] 09/02/2023, 1:07 PM

## 2023-09-02 NOTE — Progress Notes (Signed)
   09/02/23 0000  Psych Admission Type (Psych Patients Only)  Admission Status Voluntary  Psychosocial Assessment  Patient Complaints Anxiety  Eye Contact Fair  Facial Expression Animated  Affect Appropriate to circumstance  Speech Logical/coherent  Interaction Assertive  Motor Activity Other (Comment)  Appearance/Hygiene In scrubs  Behavior Characteristics Resistant to care  Mood Irritable  Thought Process  Coherency WDL  Content WDL  Delusions None reported or observed  Perception WDL  Hallucination None reported or observed  Judgment Impaired  Confusion Mild  Danger to Self  Current suicidal ideation? Denies  Danger to Others  Danger to Others None reported or observed   Pt alert & able to make needs known.  Received medications as scheduled, refusing Trazadone and Amlodipine .  Requested Melatonin for sleep, NP made aware via Secure Chat, no new orders received.  Pt noted to be resting with eyes closed, respirations even & unlabored after 11pm.  Will continue to monitor, q58min checks remain in place.

## 2023-09-02 NOTE — Plan of Care (Signed)
  Problem: Activity: Goal: Will verbalize the importance of balancing activity with adequate rest periods Outcome: Progressing   Problem: Coping: Goal: Coping ability will improve Outcome: Progressing   Problem: Health Behavior/Discharge Planning: Goal: Compliance with prescribed medication regimen will improve Outcome: Progressing

## 2023-09-02 NOTE — Progress Notes (Signed)
   09/02/23 1300  Psych Admission Type (Psych Patients Only)  Admission Status Voluntary  Psychosocial Assessment  Patient Complaints None  Eye Contact Fair  Facial Expression Animated  Affect Appropriate to circumstance  Speech Logical/coherent  Interaction Assertive  Motor Activity Other (Comment) (in w/c)  Appearance/Hygiene In scrubs  Behavior Characteristics Resistant to care (ref labs)  Mood Depressed  Thought Process  Coherency WDL  Content WDL  Delusions None reported or observed  Perception WDL  Hallucination None reported or observed  Judgment Impaired  Confusion Mild  Danger to Self  Current suicidal ideation? Denies  Danger to Others  Danger to Others None reported or observed

## 2023-09-02 NOTE — Plan of Care (Signed)
  Problem: Health Behavior/Discharge Planning: Goal: Compliance with prescribed medication regimen will improve Outcome: Progressing   Problem: Coping: Goal: Will verbalize feelings Outcome: Not Progressing

## 2023-09-02 NOTE — Progress Notes (Addendum)
 Patient initially refused CMP lab work. A second attempt was made and patient was agreeable.

## 2023-09-03 DIAGNOSIS — F259 Schizoaffective disorder, unspecified: Secondary | ICD-10-CM | POA: Diagnosis not present

## 2023-09-03 LAB — CBC WITH DIFFERENTIAL/PLATELET
Abs Immature Granulocytes: 0.03 K/uL (ref 0.00–0.07)
Basophils Absolute: 0 K/uL (ref 0.0–0.1)
Basophils Relative: 1 %
Eosinophils Absolute: 0.2 K/uL (ref 0.0–0.5)
Eosinophils Relative: 4 %
HCT: 24.9 % — ABNORMAL LOW (ref 36.0–46.0)
Hemoglobin: 7.6 g/dL — ABNORMAL LOW (ref 12.0–15.0)
Immature Granulocytes: 1 %
Lymphocytes Relative: 16 %
Lymphs Abs: 1 K/uL (ref 0.7–4.0)
MCH: 28.3 pg (ref 26.0–34.0)
MCHC: 30.5 g/dL (ref 30.0–36.0)
MCV: 92.6 fL (ref 80.0–100.0)
Monocytes Absolute: 0.5 K/uL (ref 0.1–1.0)
Monocytes Relative: 8 %
Neutro Abs: 4.4 K/uL (ref 1.7–7.7)
Neutrophils Relative %: 70 %
Platelets: 339 K/uL (ref 150–400)
RBC: 2.69 MIL/uL — ABNORMAL LOW (ref 3.87–5.11)
RDW: 16.8 % — ABNORMAL HIGH (ref 11.5–15.5)
WBC: 6.2 K/uL (ref 4.0–10.5)
nRBC: 0 % (ref 0.0–0.2)

## 2023-09-03 LAB — RENAL FUNCTION PANEL
Albumin: 3.2 g/dL — ABNORMAL LOW (ref 3.5–5.0)
Anion gap: 12 (ref 5–15)
BUN: 43 mg/dL — ABNORMAL HIGH (ref 8–23)
CO2: 25 mmol/L (ref 22–32)
Calcium: 9.1 mg/dL (ref 8.9–10.3)
Chloride: 93 mmol/L — ABNORMAL LOW (ref 98–111)
Creatinine, Ser: 2.72 mg/dL — ABNORMAL HIGH (ref 0.44–1.00)
GFR, Estimated: 19 mL/min — ABNORMAL LOW (ref >60)
Glucose, Bld: 106 mg/dL — ABNORMAL HIGH (ref 70–99)
Phosphorus: 3.1 mg/dL (ref 2.5–4.6)
Potassium: 3 mmol/L — ABNORMAL LOW (ref 3.5–5.1)
Sodium: 130 mmol/L — ABNORMAL LOW (ref 135–145)

## 2023-09-03 NOTE — Progress Notes (Signed)
  Received patient in bed to unit.   Informed consent signed and in chart.    TX duration:     Transported by  Hand-off given to patient's nurse.    Access used: Right internal jugular  Access issues: none   Total UF removed: 300 ml Medication(s) given: none Post HD VS: wnl Post HD weight: 87.5 kg     N. Remon Quinto LPN Kidney Dialysis Unit

## 2023-09-03 NOTE — Group Note (Signed)
 Date:  09/03/2023 Time:  6:33 PM  Group Topic/Focus:  Managing Feelings:   The focus of this group is to identify what feelings patients have difficulty handling and develop a plan to handle them in a healthier way upon discharge.    Participation Level:  Did Not Attend   Tiffany [name] 09/03/2023, 6:33 PM

## 2023-09-03 NOTE — Progress Notes (Signed)
 IV team at bedside to change dialysis port dressing. Pt compliant with dressing change. POC.

## 2023-09-03 NOTE — Progress Notes (Signed)
   09/03/23 1611  Psych Admission Type (Psych Patients Only)  Admission Status Voluntary  Psychosocial Assessment  Patient Complaints None  Eye Contact Fair  Facial Expression Animated  Affect Appropriate to circumstance  Speech Logical/coherent  Interaction Assertive  Motor Activity Other (Comment)  Appearance/Hygiene In scrubs  Behavior Characteristics Cooperative  Mood Pleasant  Thought Process  Coherency WDL  Content WDL  Delusions None reported or observed  Perception WDL  Hallucination None reported or observed  Judgment Impaired  Confusion Mild  Danger to Self  Current suicidal ideation? Denies

## 2023-09-03 NOTE — Progress Notes (Signed)
 [facility]  ROUNDING NOTE   Subjective:   Tiffany [name]  is a 63 y.o.  female  with an extensive past medical history including schizophrenia, hypertension, CHF, diabetes, stroke, and end-stage renal disease on hemodialysis. [name] currently admitted at inpatient behavioral health for Schizoaffective disorder [facility]) [F25.9]  Patient is known to our practice from previous admission.  States she has missed 5 days of dialysis outpatient.  Did not feel like going.  Patient seen and evaluated during.   Update: Patient seen and evaluated during dialysis   HEMODIALYSIS FLOWSHEET:  Blood Flow Rate (mL/min): 349 mL/min Arterial Pressure (mmHg): -135.35 mmHg Venous Pressure (mmHg): 118.98 mmHg TMP (mmHg): 9.29 mmHg Ultrafiltration Rate (mL/min): 400 mL/min Dialysate Flow Rate (mL/min): 300 ml/min Dialysis Fluid Bolus: Normal Saline Bolus Amount (mL): 200 mL  Appears calm and cooperative  Objective:  Vital signs in last 24 hours:  Temp:  [97.5 F (36.4 C)-98.3 F (36.8 C)] 98.3 F (36.8 C) (08/18 1123) Pulse Rate:  [66-77] 73 (08/18 1131) Resp:  [14-16] 15 (08/18 1200) BP: (135-165)/(75-96) 150/83 (08/18 1200) SpO2:  [97 %-100 %] 97 % (08/18 0725) Weight:  [88.4 kg] 88.4 kg (08/18 1121)  Weight change:  Filed Weights   08/28/23 1234 08/30/23 0814 09/03/23 1121  Weight: 87.2 kg 86.7 kg 88.4 kg    Intake/Output: No intake/output data recorded.   Intake/Output this shift:  No intake/output data recorded.  Physical Exam: General: NAD, seated in chair  Head: Normocephalic, atraumatic. Moist oral mucosal membranes  Eyes: Anicteric  Lungs:  Clear to auscultation, normal effort  Heart: Regular rate and rhythm  Abdomen:  Soft, nontender  Extremities: No peripheral edema.  Neurologic: Awake, alert, conversant  Skin: Warm,dry, no rash  Access: Right chest IJ PermCath    Basic Metabolic Panel: Recent Labs  Lab 08/28/23 1014 08/30/23 0700 09/02/23 1330   NA 129* 130* 132*  K 2.6* 2.7* 3.4*  CL 90* 91* 96*  CO2 23 27 20*  GLUCOSE 99 102* 106*  BUN 74* 63* 79*  CREATININE 4.45* 4.00* 4.25*  CALCIUM  9.2 9.4 9.6  PHOS 5.2* 5.2*  --     Liver Function Tests: Recent Labs  Lab 08/28/23 1014 08/30/23 0700 09/02/23 1330  AST  --   --  16  ALT  --   --  11  ALKPHOS  --   --  115  BILITOT  --   --  0.6  PROT  --   --  7.3  ALBUMIN  3.1* 3.2* 3.5   No results for input(s): LIPASE, AMYLASE in the last 168 hours. No results for input(s): AMMONIA  in the last 168 hours.  CBC: Recent Labs  Lab 08/28/23 1014 08/30/23 0700  WBC 6.7 6.5  NEUTROABS 4.7  --   HGB 7.4* 7.6*  HCT 23.0* 23.9*  MCV 90.2 90.2  PLT 305 258    Cardiac Enzymes: No results for input(s): CKTOTAL, CKMB, CKMBINDEX, TROPONINI in the last 168 hours.  BNP: Invalid input(s): POCBNP  CBG: No results for input(s): GLUCAP in the last 168 hours.  Microbiology: Results for orders placed or performed during the hospital encounter of 08/13/23  SARS Coronavirus 2 by RT PCR (hospital order, performed in [facility] hospital lab) *cepheid single result test* Anterior Nasal Swab     Status: None   Collection Time: 08/15/23 11:07 AM   Specimen: Anterior Nasal Swab  Result Value Ref Range Status   SARS Coronavirus 2 by RT PCR NEGATIVE NEGATIVE Final  Comment: Performed at [facility], [address], [place], [place] [address]   *Note: Due to a large number of results and/or encounters for the requested time period, some results have not been displayed. A complete set of results can be found in Results Review.    Coagulation Studies: No results for input(s): LABPROT, INR in the last 72 hours.  Urinalysis: No results for input(s): COLORURINE, LABSPEC, PHURINE, GLUCOSEU, HGBUR, BILIRUBINUR, KETONESUR, PROTEINUR, UROBILINOGEN, NITRITE, LEUKOCYTESUR in the last 72 hours.  Invalid input(s): APPERANCEUR     Imaging: No results found.   Medications:     amLODipine   5 mg Oral BID   atorvastatin   20 mg Oral Daily   calcitRIOL   0.5 mcg Oral Daily   carvedilol   12.5 mg Oral BID   Chlorhexidine  Gluconate Cloth  6 each Topical Q0600   dicyclomine   10 mg Oral TID   docusate sodium   100 mg Oral Daily   epoetin  alfa-epbx (RETACRIT ) injection  10,000 Units Intravenous Q T,Th,Sat-1800   escitalopram   10 mg Oral Daily   ezetimibe   10 mg Oral Daily   ferrous sulfate   325 mg Oral Daily   folic acid   1 mg Oral Daily   gabapentin   100 mg Oral QHS   melatonin  10 mg Oral QHS   metolazone   2.5 mg Oral Q M,W,F   mirtazapine   15 mg Oral QHS   pantoprazole   40 mg Oral Daily   potassium chloride   20 mEq Oral BID   torsemide   40 mg Oral BID   albuterol , capsaicin , heparin , heparin , hydrOXYzine , OLANZapine , OLANZapine  zydis, traZODone   Assessment/ Plan:  [name] is a 63 y.o.  female  with an extensive past medical history including schizophrenia, hypertension, CHF, diabetes, stroke, and end-stage renal disease on hemodialysis.   [facility] [place]/TTS/Rt permcath Time 10:25am  End-stage renal disease on hemodialysis.  Receiving shortened dialysis today, UF 0-0.5L as tolerated.  Refused treatment on Saturday. Next treatment scheduled for Tuesday.  2. Anemia of chronic kidney disease Lab Results  Component Value Date   HGB 7.6 (L) 08/30/2023    Hemoglobin 7.6.  Continue Retacrit  10,000 units IV with dialysis.  3. Secondary Hyperparathyroidism: with outpatient labs: PTH 1310, phosphorus 5.9, calcium  8.4 on 07/26/2023.   Lab Results  Component Value Date   PTH 57 02/18/2020   CALCIUM  9.6 09/02/2023   CAION 1.03 (L) 06/14/2023   PHOS 5.2 (H) 08/30/2023     Maintain the patient on calcitriol . Awaiting updated labs   LOS: 19 Caiden Monsivais 8/18/202512:08 PM

## 2023-09-03 NOTE — Plan of Care (Signed)
   Problem: Activity: Goal: Will verbalize the importance of balancing activity with adequate rest periods Outcome: Progressing   Problem: Education: Goal: Will be free of psychotic symptoms Outcome: Progressing Goal: Knowledge of the prescribed therapeutic regimen will improve Outcome: Progressing   Problem: Coping: Goal: Coping ability will improve Outcome: Progressing Goal: Will verbalize feelings Outcome: Progressing   Problem: Health Behavior/Discharge Planning: Goal: Compliance with prescribed medication regimen will improve Outcome: Progressing   Problem: Nutritional: Goal: Ability to achieve adequate nutritional intake will improve Outcome: Progressing   Problem: Role Relationship: Goal: Ability to communicate needs accurately will improve Outcome: Progressing Goal: Ability to interact with others will improve Outcome: Progressing   Problem: Safety: Goal: Ability to redirect hostility and anger into socially appropriate behaviors will improve Outcome: Progressing Goal: Ability to remain free from injury will improve Outcome: Progressing   Problem: Self-Care: Goal: Ability to participate in self-care as condition permits will improve Outcome: Progressing   Problem: Self-Concept: Goal: Will verbalize positive feelings about self Outcome: Progressing   Problem: Education: Goal: Utilization of techniques to improve thought processes will improve Outcome: Progressing Goal: Knowledge of the prescribed therapeutic regimen will improve Outcome: Progressing

## 2023-09-03 NOTE — Group Note (Signed)
 Recreation Therapy Group Note   Group Topic:Emotion Expression (Part 2) Group Date: 09/03/2023 Start Time: 1500 End Time: 1600 Facilitators: Celestia Jeoffrey BRAVO, LRT, CTRS Location: Dayroom  Group Description: Painting a Peaceful Place. Patients and LRT discuss what it means to be "at peace", what it feels like physically and mentally. Pts are given a canvas and watercolor paint to use and encouraged to draw their idea of a peaceful place. Pts and LRT discuss how they use this in their daily life post discharge. Pts are encouraged to take their canvas home with them as a reminder to find their peaceful place whenever they are feeling depressed, anxious, etc.    Goal Area(s) Addressed:  Patient will identify what it means to experience a "peaceful" emotion. Patient will identify a new coping skill.  Patient will express their emotions through art. Patients will increase communication by talking with LRT and peers while in group.   Affect/Mood: Appropriate   Participation Level: Active and Engaged   Participation Quality: Independent   Behavior: Appropriate, Calm, and Cooperative   Speech/Thought Process: Coherent   Insight: Good   Judgement: Good   Modes of Intervention: Art   Patient Response to Interventions:  Attentive, Engaged, Interested , and Receptive   Education Outcome:  Acknowledges education   Clinical Observations/Individualized Feedback: Due to the high therapeutic value, LRT continued intervention across groups to facilitate completion of task.   Plan: Continue to engage patient in RT group sessions 2-3x/week.   Jeoffrey BRAVO Celestia, LRT, CTRS 09/03/2023 5:30 PM

## 2023-09-03 NOTE — Progress Notes (Signed)
 [facility] MD Progress Note  09/03/2023 4:20 PM Tiffany [name]  MRN:  [id number]  ill M Nussbaumer is a 63 y.o. female admitted: Presented to the [facility] 08/13/2023  7:48 PM for after presenting due to a fall later patient exhibit extreme paranoia. She carries the psychiatric diagnoses of PTSD, MDD with and without psychotic features, schizoaffective disorder, and history of polysubstance use and has a past medical history of hypertension, ESRD with HD, congestive heart failure, diabetes, COPD, , stroke. Her current presentation of paranoia, delusional thoughts, auditory and visual hallucinations is most consistent with schizoaffective disorder. She meets criteria for inpatient psychiatric admission based on current symptom Patient is admitted to [facility] unit with Q15 min safety monitoring. Multidisciplinary team approach is offered. Medication management; group/milieu therapy is offered.    Subjective: Chart is reviewed and discussed the case with treatment team.  On interview patient is noted to be resting in bed.  [name] noted to be in deep sleep and was drowsy even after she responded to verbal commands intermittently.  She answered very barely to the questions but answered the safety questions.  She denies SI/HI/plan and denies hallucinations.  [name] unable to give details about her weekend or her dialysis and any other concerns that she has.  Per nurse staff patient is taking her medications and denies having any side effect   Sleep: Fair  Appetite:  Fair  Past Psychiatric History: see h&P Family History:  Family History  Problem Relation Age of Onset   Cancer Mother        lung   Depression Mother    Cancer Father        prostate   Depression Sister    Anxiety disorder Sister    Schizophrenia Sister    Bipolar disorder Sister    Depression Sister    Depression Brother    Heart failure Other        cousin   Social History:  Social History   Substance and Sexual Activity  Alcohol  Use No   Alcohol/week: 0.0 standard drinks of alcohol     Social History   Substance and Sexual Activity  Drug Use Not Currently   Types: Cocaine   Comment: 04/10/2016 last used cocaine back in November 2017    Social History   Socioeconomic History   Marital status: Widowed    Spouse name: Not on file   Number of children: 3   Years of education: 12th   Highest education level: 12th grade  Occupational History   Occupation: disabled    Comment: factory production  Tobacco Use   Smoking status: Former    Current packs/day: 0.00    Average packs/day: 1.5 packs/day for 39.1 years (58.6 ttl pk-yrs)    Types: Cigarettes    Start date: 03/13/1977    Quit date: 04/10/2016    Years since quitting: 7.4    Passive exposure: Past   Smokeless tobacco: Never  Vaping Use   Vaping status: Never Used  Substance and Sexual Activity   Alcohol use: No    Alcohol/week: 0.0 standard drinks of alcohol   Drug use: Not Currently    Types: Cocaine    Comment: 04/10/2016 last used cocaine back in November 2017   Sexual activity: Not Currently    Birth control/protection: Surgical  Other Topics Concern   Not on file  Social History Narrative   Has 1 son, [name]   Lives with son and his boyfriend   Her  house has ramps and handrails should she ever needs them.    Her mother lives down the street from her and is a good support person in addition to her son.   She drives herself, has private transportation.    Cocaine free since 02/24/16, smoke free since 04/10/16   Dialysis M-W-F   Social Drivers of Health   Financial Resource Strain: Low Risk  (07/11/2022)   Overall Financial Resource Strain (CARDIA)    Difficulty of Paying Living Expenses: Not very hard  Food Insecurity: Food Insecurity Present (08/15/2023)   Hunger Vital Sign    Worried About Running Out of Food in the Last Year: Sometimes true    Ran Out of Food in the Last Year: Sometimes true  Transportation Needs: No Transportation  Needs (08/15/2023)   PRAPARE - Administrator, Civil Service (Medical): No    Lack of Transportation (Non-Medical): No  Physical Activity: Inactive (07/11/2022)   Exercise Vital Sign    Days of Exercise per Week: 0 days    Minutes of Exercise per Session: 0 min  Stress: Stress Concern Present (07/11/2022)   [employer] of Occupational Health - Occupational Stress Questionnaire    Feeling of Stress : To some extent  Social Connections: Socially Isolated (06/28/2023)   Social Connection and Isolation Panel    Frequency of Communication with Friends and Family: Three times a week    Frequency of Social Gatherings with Friends and Family: Three times a week    Attends Religious Services: Never    Active Member of Clubs or Organizations: No    Attends Banker Meetings: Never    Marital Status: Widowed   Past Medical History:  Past Medical History:  Diagnosis Date   Abdominal pain    Acute encephalopathy 11/21/2017   Acute exacerbation of CHF (congestive heart failure) (HCC) 03/03/2022   Acute GI bleeding 03/29/2021   Acute metabolic encephalopathy 02/20/2020   Aggressive behavior    Agitation 11/22/2017   Anoxic brain injury (HCC) 09/08/2016   C. Arrest due to respiratory failure and COPD exacerbation   Anxiety    Arthritis    all over (04/10/2016)   Asthma 10/18/2010   Binge eating disorder    Blood loss anemia 04/24/2011   CAP (community acquired pneumonia) 06/22/2015   Cardiac arrest (HCC) 09/08/2016   PEA   Carotid artery stenosis    1-39% bilateral by dopplers 11/2016   Chronic diastolic (congestive) heart failure (HCC)    Chronic pain syndrome 06/18/2012   Chronic post-traumatic stress disorder (PTSD) 05/27/2018   Chronic respiratory failure with hypoxia and hypercapnia (HCC) 06/22/2015   TRILOGY Vent >AVAPA-ES., Vt target 200-400, Max P 30 , PS max 20 , PS min 6-10 , E Max 6, E Min 4, Rate Auto AVAPS Rate 2 (titrate for pt comfort) , bleed  O2 at 5l/m continuous flow .    Closed displaced fracture of fifth metacarpal bone 03/21/2018   Cocaine use disorder, severe, in sustained remission (HCC) 12/17/2015   Complication of anesthesia    decreased bp, decreased heart rate   COPD (chronic obstructive pulmonary disease) (HCC) 07/08/2014   Delusional disorder, persecutory type (HCC) 06/26/2021   Depression    Diabetic neuropathy (HCC) 04/24/2011   Difficulty with speech 01/24/2018   Disorder of nervous system    Drug abuse (HCC) 11/21/2017   Dyslipidemia 04/24/2011   E. coli UTI 02/20/2020   Elevated troponin 04/28/2012   Emphysema    Encephalopathy 11/21/2017  Essential hypertension 03/22/2016   Facet arthropathy, lumbar 06/12/2022   Fibula fracture 07/10/2016   Frequent falls 10/11/2017   GERD (gastroesophageal reflux disease)    GI bleed 03/30/2021   Gout 04/11/2017   Heart attack (HCC) 1980s   Hematochezia    History of blood transfusion 1994   couldn't stop bleeding from my period   History of drug abuse in remission (HCC) 11/28/2015   Quit in 2017   Hyperlipidemia 03/31/2021   Hyperlipidemia LDL goal <70    Hypocalcemia    Hypokalemia    Hypomagnesemia    Incontinence    Manic depression (HCC)    Morbid obesity (HCC) 10/18/2010   Obstructive sleep apnea 10/18/2010   On home oxygen  therapy    6L; 24/7 (04/10/2016)   OSA on CPAP    wear mask sometimes (04/10/2016)   Painless rectal bleeding 12/31/2015   Paranoid (HCC)    sometimes; I'm on RX for it (04/10/2016)   Periumbilical abdominal pain    Prolonged Q-T interval on ECG    Rectal bleeding 12/31/2015   Recurrent syncope 07/09/2021   Rhabdomyolysis 06/16/2021   Schizoaffective disorder, bipolar type (HCC) 04/05/2018   Seasonal allergies    Seborrheic keratoses 12/31/2013   Seizures (HCC)    don't know what kind; last one was ~ 1 yr ago (04/10/2016)   Sinus bradycardia    Skin ulcer of sacrum, limited to breakdown of skin (HCC) 03/08/2022    Spondylolisthesis at L3-L4 level 06/12/2022   Stroke (HCC) 1980s   denies residual on 04/10/2016   Thrush 09/19/2013   Type 2 diabetes mellitus (HCC) 10/18/2010    Past Surgical History:  Procedure Laterality Date   A/V FISTULAGRAM N/A 02/01/2023   Procedure: A/V Fistulagram;  Surgeon: Norine Manuelita LABOR, MD;  Location: [facility] INVASIVE CV LAB;  Service: Cardiovascular;  Laterality: N/A;   AV FISTULA PLACEMENT Left 12/21/2022   Procedure: INSERTION OF LEFT ARM ARTERIOVENOUS (AV) GORE-TEX GRAFT;  Surgeon: Magda Debby SAILOR, MD;  Location: [facility] OR;  Service: Vascular;  Laterality: Left;   CESAREAN SECTION  1997   COLONOSCOPY WITH PROPOFOL  N/A 04/01/2021   Procedure: COLONOSCOPY WITH PROPOFOL ;  Surgeon: Rollin Dover, MD;  Location[facility] ENDOSCOPY;  Service: Gastroenterology;  Laterality: N/A;  Rectal bleeding with drop in hemoglobin to 7.2 g/dL   HERNIA REPAIR     IR CHOLANGIOGRAM EXISTING TUBE  07/20/2016   IR PERC CHOLECYSTOSTOMY  05/10/2016   IR RADIOLOGIST EVAL & MGMT  06/08/2016   IR RADIOLOGIST EVAL & MGMT  06/29/2016   IR SINUS/FIST TUBE CHK-NON GI  07/12/2016   RIGHT/LEFT HEART CATH AND CORONARY ANGIOGRAPHY N/A 06/19/2017   Procedure: RIGHT/LEFT HEART CATH AND CORONARY ANGIOGRAPHY;  Surgeon: Cherrie Toribio SAUNDERS, MD;  Location: [facility] INVASIVE CV LAB;  Service: Cardiovascular;  Laterality: N/A;   TIBIA IM NAIL INSERTION Right 07/12/2016   Procedure: INTRAMEDULLARY (IM) NAIL RIGHT TIBIA;  Surgeon: Jerri Kay HERO, MD;  Location: [facility] OR;  Service: Orthopedics;  Laterality: Right;   UMBILICAL HERNIA REPAIR  ~ 1963   that's why I don't have a belly button   VAGINAL HYSTERECTOMY      Current Medications: Current Facility-Administered Medications  Medication Dose Route Frequency Provider Last Rate Last Admin   albuterol  (VENTOLIN  HFA) 108 (90 Base) MCG/ACT inhaler 2 puff  2 puff Inhalation Q6H PRN Mannie Jerel PARAS, NP   2 puff at 09/02/23 2138   amLODipine  (NORVASC ) tablet 5 mg  5 mg Oral BID  Mannie Jerel PARAS, NP  5 mg at 09/02/23 2139   atorvastatin  (LIPITOR ) tablet 20 mg  20 mg Oral Daily Mannie Jerel PARAS, NP   20 mg at 09/02/23 1034   calcitRIOL  (ROCALTROL ) capsule 0.5 mcg  0.5 mcg Oral Daily Skiler Tye, MD   0.5 mcg at 09/02/23 1031   capsaicin  (ZOSTRIX) 0.025 % cream   Topical BID PRN Onuoha, Chinwendu V, NP   Given at 09/02/23 2138   carvedilol  (COREG ) tablet 12.5 mg  12.5 mg Oral BID Mannie Jerel PARAS, NP   12.5 mg at 09/02/23 2137   Chlorhexidine  Gluconate Cloth 2 % PADS 6 each  6 each Topical Q0600 Druscilla Bald, NP   6 each at 09/01/23 9346   dicyclomine  (BENTYL ) capsule 10 mg  10 mg Oral TID Mannie Jerel PARAS, NP   10 mg at 09/02/23 2137   docusate sodium  (COLACE) capsule 100 mg  100 mg Oral Daily Abby Tucholski, MD   100 mg at 09/01/23 1130   epoetin  alfa-epbx (RETACRIT ) injection 10,000 Units  10,000 Units Intravenous Q T,Th,Sat-1800 Breeze, Bald, NP   10,000 Units at 08/30/23 0946   escitalopram  (LEXAPRO ) tablet 10 mg  10 mg Oral Daily Braxden Lovering, MD   10 mg at 09/02/23 1032   ezetimibe  (ZETIA ) tablet 10 mg  10 mg Oral Daily Ylianna Almanzar, MD   10 mg at 09/02/23 1032   ferrous sulfate  tablet 325 mg  325 mg Oral Daily Phuong Hillary, MD   325 mg at 09/02/23 1032   folic acid  (FOLVITE ) tablet 1 mg  1 mg Oral Daily Genisis Sonnier, MD   1 mg at 09/02/23 1033   gabapentin  (NEURONTIN ) capsule 100 mg  100 mg Oral QHS Mannie Jerel PARAS, NP   100 mg at 09/02/23 2137   hydrOXYzine  (ATARAX ) tablet 25 mg  25 mg Oral Q6H PRN Kendrick Remigio, MD   25 mg at 08/31/23 2122   melatonin tablet 10 mg  10 mg Oral QHS Madaram, Kondal R, MD   10 mg at 09/02/23 2137   metolazone  (ZAROXOLYN ) tablet 2.5 mg  2.5 mg Oral Q M,W,F Euel Castile, MD   2.5 mg at 08/31/23 1019   mirtazapine  (REMERON ) tablet 15 mg  15 mg Oral QHS Alleta Avery, MD   15 mg at 09/02/23 2137   OLANZapine  (ZYPREXA ) injection 5 mg  5 mg Intramuscular TID PRN Mannie Jerel PARAS, NP       OLANZapine  zydis  (ZYPREXA ) disintegrating tablet 5 mg  5 mg Oral TID PRN Mannie Jerel PARAS, NP   5 mg at 08/18/23 2318   pantoprazole  (PROTONIX ) EC tablet 40 mg  40 mg Oral Daily Galina Haddox, MD   40 mg at 09/02/23 1033   potassium chloride  (KLOR-CON  M) CR tablet 20 mEq  20 mEq Oral BID Druscilla Bald, NP   20 mEq at 09/02/23 2137   torsemide  (DEMADEX ) tablet 40 mg  40 mg Oral BID Mannie Jerel PARAS, NP   40 mg at 09/02/23 1728   traZODone  (DESYREL ) tablet 50 mg  50 mg Oral QHS PRN Madaram, Kondal R, MD        Lab Results:  Results for orders placed or performed during the hospital encounter of 08/15/23 (from the past 48 hours)  Comprehensive metabolic panel     Status: Abnormal   Collection Time: 09/02/23  1:30 PM  Result Value Ref Range   Sodium 132 (L) 135 - 145 mmol/L   Potassium 3.4 (L) 3.5 - 5.1 mmol/L   Chloride 96 (  L) 98 - 111 mmol/L   CO2 20 (L) 22 - 32 mmol/L   Glucose, Bld 106 (H) 70 - 99 mg/dL    Comment: Glucose reference range applies only to samples taken after fasting for at least 8 hours.   BUN 79 (H) 8 - 23 mg/dL   Creatinine, Ser 5.74 (H) 0.44 - 1.00 mg/dL   Calcium  9.6 8.9 - 10.3 mg/dL   Total Protein 7.3 6.5 - 8.1 g/dL   Albumin  3.5 3.5 - 5.0 g/dL   AST 16 15 - 41 U/L   ALT 11 0 - 44 U/L   Alkaline Phosphatase 115 38 - 126 U/L   Total Bilirubin 0.6 0.0 - 1.2 mg/dL   GFR, Estimated 11 (L) >60 mL/min    Comment: (NOTE) Calculated using the CKD-EPI Creatinine Equation (2021)    Anion gap 16 (H) 5 - 15    Comment: Performed at [facility], [address] [place], [place] [address]  CBC with Differential/Platelet     Status: Abnormal   Collection Time: 09/03/23  1:07 PM  Result Value Ref Range   WBC 6.2 4.0 - 10.5 K/uL   RBC 2.69 (L) 3.87 - 5.11 MIL/uL   Hemoglobin 7.6 (L) 12.0 - 15.0 g/dL   HCT 75.0 (L) 63.9 - 53.9 %   MCV 92.6 80.0 - 100.0 fL   MCH 28.3 26.0 - 34.0 pg   MCHC 30.5 30.0 - 36.0 g/dL   RDW 83.1 (H) 88.4 - 84.4 %   Platelets 339 150 - 400 K/uL    nRBC 0.0 0.0 - 0.2 %   Neutrophils Relative % 70 %   Neutro Abs 4.4 1.7 - 7.7 K/uL   Lymphocytes Relative 16 %   Lymphs Abs 1.0 0.7 - 4.0 K/uL   Monocytes Relative 8 %   Monocytes Absolute 0.5 0.1 - 1.0 K/uL   Eosinophils Relative 4 %   Eosinophils Absolute 0.2 0.0 - 0.5 K/uL   Basophils Relative 1 %   Basophils Absolute 0.0 0.0 - 0.1 K/uL   Immature Granulocytes 1 %   Abs Immature Granulocytes 0.03 0.00 - 0.07 K/uL    Comment: Performed at [facility], [address], [place], [place] [address]   *Note: Due to a large number of results and/or encounters for the requested time period, some results have not been displayed. A complete set of results can be found in Results Review.      Blood Alcohol level:  Lab Results  Component Value Date   [facility] <15 05/31/2023   ETH <10 03/10/2023    Metabolic Disorder Labs: Lab Results  Component Value Date   HGBA1C 5.0 01/05/2022   MPG 99.67 06/17/2021   MPG 105.41 07/23/2020   No results found for: PROLACTIN Lab Results  Component Value Date   CHOL 151 11/14/2022   TRIG 100 11/14/2022   HDL 65 11/14/2022   CHOLHDL 2.3 11/14/2022   VLDL 20 11/14/2022   LDLCALC 66 11/14/2022   LDLCALC 54 08/05/2018    Physical Findings: AIMS:  , ,  ,  ,    CIWA:    COWS:      Psychiatric Specialty Exam:  Presentation  General Appearance:  Appropriate for Environment; Casual  Eye Contact: Fair  Speech: Clear and Coherent  Speech Volume: Normal    Mood and Affect  Mood: Normal Affect: Euthymic  Thought Process  Thought Processes: Disorganized But improving Descriptions of Associations:Intact  Orientation:Partial  Thought Content:Paranoid Ideation  Hallucinations:  Auditory hallucinations  Ideas of Reference:Paranoia; Delusions  Suicidal Thoughts: Denies  Homicidal Thoughts: Denies   Sensorium  Memory: Immediate Fair; Recent Fair; Remote Poor  Judgment: Impaired  Insight: Shallow         Physical Exam: Physical Exam Vitals and nursing note reviewed.    ROS Blood pressure (!) 139/97, pulse 73, temperature (!) 97.5 F (36.4 C), temperature source Oral, resp. rate 15, weight 87.5 kg, SpO2 100%. Body mass index is 35.28 kg/m.  Diagnosis: Principal Problem:   Schizoaffective disorder [facility])   Clinical Decision Making: Patient with history of Schizoaffective disorder Bipolar type, end-stage renal disease admitted for worsening paranoia and delusions about people abusing her, out to get her.  Her paranoia worsened to the point where she refused to leave the house and skipped her dialysis for 5 days which led up to medical emergency.  Patient needs inpatient psychiatric hospitalization for further stabilization.   Treatment Plan Summary:   Safety and Monitoring:             -- Voluntary admission to inpatient psychiatric unit for safety, stabilization and treatment             -- Daily contact with patient to assess and evaluate symptoms and progress in treatment             -- Patient's case to be discussed in multi-disciplinary team meeting             -- Observation Level: q15 minute checks             -- Vital signs:  q12 hours             -- Precautions: suicide, elopement, and assault   2. Psychiatric Diagnoses and Treatment:               Discontinued Zyprexa .    Invega  LAI 08/26/2023 Invega  9 mg daily-discontinued after the LAI to minimize daytime sedation. Lexapro  5 mg daily-Will titrate it up to 10 mg daily Prazosin    Patient has chronic delusions but exacerbated to the extent of not going to dialysis-   Will consider adding saphris  which is her home meds   -- The risks/benefits/side-effects/alternatives to this medication were discussed in detail with the patient and time was given for questions. The patient consents to medication trial.                -- Metabolic profile and EKG monitoring obtained while on an atypical antipsychotic (BMI: Lipid  Panel: HbgA1c: QTc:)              -- Encouraged patient to participate in unit milieu and in scheduled group therapies                            3. Medical Issues Being Addressed:   ESRD: consulted nephrology for dialysis 4. Discharge Planning:   -- Social work and case management to assist with discharge planning and identification of hospital follow-up needs prior to discharge  -- Estimated LOS: 3-4 days  Keidrick Murty, MD 09/03/2023, 4:20 PM

## 2023-09-03 NOTE — Progress Notes (Signed)
   09/02/23 2100  Psych Admission Type (Psych Patients Only)  Admission Status Voluntary  Psychosocial Assessment  Patient Complaints None  Eye Contact Fair  Facial Expression Animated  Affect Appropriate to circumstance  Speech Logical/coherent  Interaction Assertive  Motor Activity Other (Comment) (wheelchair)  Appearance/Hygiene In scrubs  Behavior Characteristics Appropriate to situation;Cooperative  Mood Depressed  Thought Process  Coherency WDL  Content WDL  Delusions None reported or observed  Perception WDL  Hallucination None reported or observed  Judgment Impaired  Confusion Mild  Danger to Self  Current suicidal ideation? Denies  Danger to Others  Danger to Others None reported or observed

## 2023-09-04 DIAGNOSIS — F259 Schizoaffective disorder, unspecified: Secondary | ICD-10-CM | POA: Diagnosis not present

## 2023-09-04 LAB — CBC
HCT: 24.4 % — ABNORMAL LOW (ref 36.0–46.0)
Hemoglobin: 7.6 g/dL — ABNORMAL LOW (ref 12.0–15.0)
MCH: 28.9 pg (ref 26.0–34.0)
MCHC: 31.1 g/dL (ref 30.0–36.0)
MCV: 92.8 fL (ref 80.0–100.0)
Platelets: 337 K/uL (ref 150–400)
RBC: 2.63 MIL/uL — ABNORMAL LOW (ref 3.87–5.11)
RDW: 17 % — ABNORMAL HIGH (ref 11.5–15.5)
WBC: 5.9 K/uL (ref 4.0–10.5)
nRBC: 0 % (ref 0.0–0.2)

## 2023-09-04 LAB — RENAL FUNCTION PANEL
Albumin: 3.3 g/dL — ABNORMAL LOW (ref 3.5–5.0)
Anion gap: 11 (ref 5–15)
BUN: 55 mg/dL — ABNORMAL HIGH (ref 8–23)
CO2: 26 mmol/L (ref 22–32)
Calcium: 9.6 mg/dL (ref 8.9–10.3)
Chloride: 96 mmol/L — ABNORMAL LOW (ref 98–111)
Creatinine, Ser: 3.14 mg/dL — ABNORMAL HIGH (ref 0.44–1.00)
GFR, Estimated: 16 mL/min — ABNORMAL LOW (ref >60)
Glucose, Bld: 118 mg/dL — ABNORMAL HIGH (ref 70–99)
Phosphorus: 5 mg/dL — ABNORMAL HIGH (ref 2.5–4.6)
Potassium: 3.2 mmol/L — ABNORMAL LOW (ref 3.5–5.1)
Sodium: 133 mmol/L — ABNORMAL LOW (ref 135–145)

## 2023-09-04 MED ORDER — HEPARIN SODIUM (PORCINE) 1000 UNIT/ML IJ SOLN
INTRAMUSCULAR | Status: AC
  Filled 2023-09-04: qty 4

## 2023-09-04 MED ORDER — EPOETIN ALFA-EPBX 10000 UNIT/ML IJ SOLN
INTRAMUSCULAR | Status: AC
  Filled 2023-09-04: qty 1

## 2023-09-04 MED ORDER — HEPARIN SODIUM (PORCINE) 1000 UNIT/ML DIALYSIS: 25.0000 [IU]/kg | INTRAMUSCULAR | Status: DC | PRN

## 2023-09-04 NOTE — Group Note (Signed)
 Date:  09/04/2023 Time:  12:33 AM  Group Topic/Focus:  Wrap-Up Group:   The focus of this group is to help patients review their daily goal of treatment and discuss progress on daily workbooks.    Participation Level:  Active  Participation Quality:  Appropriate  Affect:  Appropriate  Cognitive:  Alert  Insight: Appropriate  Engagement in Group:  Engaged  Modes of Intervention:  Discussion  Additional Comments:    Tiffany [name] 09/04/2023, 12:33 AM

## 2023-09-04 NOTE — Progress Notes (Signed)
   09/03/23 2100  Psych Admission Type (Psych Patients Only)  Admission Status Voluntary  Psychosocial Assessment  Patient Complaints None  Eye Contact Fair  Facial Expression Animated  Affect Appropriate to circumstance  Speech Logical/coherent  Interaction Assertive  Motor Activity Other (Comment) (wheelchair)  Appearance/Hygiene In scrubs  Behavior Characteristics Cooperative  Mood Pleasant  Thought Process  Coherency WDL  Content WDL  Delusions None reported or observed  Perception WDL  Hallucination None reported or observed  Judgment Impaired  Confusion Mild  Danger to Self  Current suicidal ideation? Denies

## 2023-09-04 NOTE — Progress Notes (Signed)
 Per patient request this nurse called to see what time patient was scheduled for dialysis and Will stated we dont know. I did inform Will that patient refuses once it gets later in the evening: He stated ok, thanks for letting me Know.

## 2023-09-04 NOTE — Group Note (Signed)
 Recreation Therapy Group Note   Group Topic:Animal Assisted Therapy   Group Date: 09/04/2023 Start Time: 1030 End Time: 1100 Facilitators: Celestia Jeoffrey BRAVO, LRT, CTRS Location: Dayroom  Group Description: AAA. Animal-Assisted Activity provides opportunities for motivational, educational, therapeutic and/or recreational benefits to enhance quality of life. [name] and [name] visited the unit to interact with patients.   Goal Areas Addressed:  Reduced anxiety and stress Improved mood Increased social interaction Enhanced communication skills Reduced loneliness and isolation Improved emotional regulation  Affect/Mood: Appropriate   Participation Level: Active    Clinical Observations/Individualized Feedback: Tiffany [name] interacted well with LRT and peers duration of session.   Plan: Continue to engage patient in RT group sessions 2-3x/week.   Jeoffrey BRAVO Celestia, LRT, CTRS 09/04/2023 1:34 PM

## 2023-09-04 NOTE — Progress Notes (Signed)
 [facility] MD Progress Note  09/04/2023 11:13 PM Tiffany [name]  MRN:  [id number]  [name] is a 63 y.o. female admitted: Presented to the [facility] 08/13/2023  7:48 PM for after presenting due to a fall later patient exhibit extreme paranoia. She carries the psychiatric diagnoses of PTSD, MDD with and without psychotic features, schizoaffective disorder, and history of polysubstance use and has a past medical history of hypertension, ESRD with HD, congestive heart failure, diabetes, COPD, , stroke. Her current presentation of paranoia, delusional thoughts, auditory and visual hallucinations is most consistent with schizoaffective disorder. She meets criteria for inpatient psychiatric admission based on current symptom Patient is admitted to [facility] unit with Q15 min safety monitoring. Multidisciplinary team approach is offered. Medication management; group/milieu therapy is offered.    Subjective: Chart is reviewed and discussed the case with treatment team.  Patient is noted to be making phone calls for different places after discharge.  Patient was informed about the possible discharge today to.  Patient reports that she is looking at places in [place], [place] , [place] .  Provider asked patient to think about follow-up and setting up the dialysis and other states.  Patient reports that she is going to reach out to her nephrology team and take their help with those appointments.  She denies SI/HI/plan.  She denies auditory/visual hallucinations.  She reports fair appetite and sleep.  Sleep: Fair  Appetite:  Fair  Past Psychiatric History: see h&P Family History:  Family History  Problem Relation Age of Onset   Cancer Mother        lung   Depression Mother    Cancer Father        prostate   Depression Sister    Anxiety disorder Sister    Schizophrenia Sister    Bipolar disorder Sister    Depression Sister    Depression Brother    Heart failure Other        cousin   Social  History:  Social History   Substance and Sexual Activity  Alcohol Use No   Alcohol/week: 0.0 standard drinks of alcohol     Social History   Substance and Sexual Activity  Drug Use Not Currently   Types: Cocaine   Comment: 04/10/2016 last used cocaine back in November 2017    Social History   Socioeconomic History   Marital status: Widowed    Spouse name: Not on file   Number of children: 3   Years of education: 12th   Highest education level: 12th grade  Occupational History   Occupation: disabled    Comment: factory production  Tobacco Use   Smoking status: Former    Current packs/day: 0.00    Average packs/day: 1.5 packs/day for 39.1 years (58.6 ttl pk-yrs)    Types: Cigarettes    Start date: 03/13/1977    Quit date: 04/10/2016    Years since quitting: 7.4    Passive exposure: Past   Smokeless tobacco: Never  Vaping Use   Vaping status: Never Used  Substance and Sexual Activity   Alcohol use: No    Alcohol/week: 0.0 standard drinks of alcohol   Drug use: Not Currently    Types: Cocaine    Comment: 04/10/2016 last used cocaine back in November 2017   Sexual activity: Not Currently    Birth control/protection: Surgical  Other Topics Concern   Not on file  Social History Narrative   Has 1 son, [name]   Lives with son and his  boyfriend   Her house has ramps and handrails should she ever needs them.    Her mother lives down the street from her and is a good support person in addition to her son.   She drives herself, has private transportation.    Cocaine free since 02/24/16, smoke free since 04/10/16   Dialysis Tiffany-W-F   Social Drivers of Health   Financial Resource Strain: Low Risk  (07/11/2022)   Overall Financial Resource Strain (CARDIA)    Difficulty of Paying Living Expenses: Not very hard  Food Insecurity: Food Insecurity Present (08/15/2023)   Hunger Vital Sign    Worried About Running Out of Food in the Last Year: Sometimes true    Ran Out of Food in the  Last Year: Sometimes true  Transportation Needs: No Transportation Needs (08/15/2023)   PRAPARE - Administrator, Civil Service (Medical): No    Lack of Transportation (Non-Medical): No  Physical Activity: Inactive (07/11/2022)   Exercise Vital Sign    Days of Exercise per Week: 0 days    Minutes of Exercise per Session: 0 min  Stress: Stress Concern Present (07/11/2022)   [employer] of Occupational Health - Occupational Stress Questionnaire    Feeling of Stress : To some extent  Social Connections: Socially Isolated (06/28/2023)   Social Connection and Isolation Panel    Frequency of Communication with Friends and Family: Three times a week    Frequency of Social Gatherings with Friends and Family: Three times a week    Attends Religious Services: Never    Active Member of Clubs or Organizations: No    Attends Banker Meetings: Never    Marital Status: Widowed   Past Medical History:  Past Medical History:  Diagnosis Date   Abdominal pain    Acute encephalopathy 11/21/2017   Acute exacerbation of CHF (congestive heart failure) (HCC) 03/03/2022   Acute GI bleeding 03/29/2021   Acute metabolic encephalopathy 02/20/2020   Aggressive behavior    Agitation 11/22/2017   Anoxic brain injury (HCC) 09/08/2016   C. Arrest due to respiratory failure and COPD exacerbation   Anxiety    Arthritis    all over (04/10/2016)   Asthma 10/18/2010   Binge eating disorder    Blood loss anemia 04/24/2011   CAP (community acquired pneumonia) 06/22/2015   Cardiac arrest (HCC) 09/08/2016   PEA   Carotid artery stenosis    1-39% bilateral by dopplers 11/2016   Chronic diastolic (congestive) heart failure (HCC)    Chronic pain syndrome 06/18/2012   Chronic post-traumatic stress disorder (PTSD) 05/27/2018   Chronic respiratory failure with hypoxia and hypercapnia (HCC) 06/22/2015   TRILOGY Vent >AVAPA-ES., Vt target 200-400, Max P 30 , PS max 20 , PS min 6-10 , E Max  6, E Min 4, Rate Auto AVAPS Rate 2 (titrate for pt comfort) , bleed O2 at 5l/Tiffany continuous flow .    Closed displaced fracture of fifth metacarpal bone 03/21/2018   Cocaine use disorder, severe, in sustained remission (HCC) 12/17/2015   Complication of anesthesia    decreased bp, decreased heart rate   COPD (chronic obstructive pulmonary disease) (HCC) 07/08/2014   Delusional disorder, persecutory type (HCC) 06/26/2021   Depression    Diabetic neuropathy (HCC) 04/24/2011   Difficulty with speech 01/24/2018   Disorder of nervous system    Drug abuse (HCC) 11/21/2017   Dyslipidemia 04/24/2011   E. coli UTI 02/20/2020   Elevated troponin 04/28/2012   Emphysema  Encephalopathy 11/21/2017   Essential hypertension 03/22/2016   Facet arthropathy, lumbar 06/12/2022   Fibula fracture 07/10/2016   Frequent falls 10/11/2017   GERD (gastroesophageal reflux disease)    GI bleed 03/30/2021   Gout 04/11/2017   Heart attack (HCC) 1980s   Hematochezia    History of blood transfusion 1994   couldn't stop bleeding from my period   History of drug abuse in remission (HCC) 11/28/2015   Quit in 2017   Hyperlipidemia 03/31/2021   Hyperlipidemia LDL goal <70    Hypocalcemia    Hypokalemia    Hypomagnesemia    Incontinence    Manic depression (HCC)    Morbid obesity (HCC) 10/18/2010   Obstructive sleep apnea 10/18/2010   On home oxygen  therapy    6L; 24/7 (04/10/2016)   OSA on CPAP    wear mask sometimes (04/10/2016)   Painless rectal bleeding 12/31/2015   Paranoid (HCC)    sometimes; I'Tiffany on RX for it (04/10/2016)   Periumbilical abdominal pain    Prolonged Q-T interval on ECG    Rectal bleeding 12/31/2015   Recurrent syncope 07/09/2021   Rhabdomyolysis 06/16/2021   Schizoaffective disorder, bipolar type (HCC) 04/05/2018   Seasonal allergies    Seborrheic keratoses 12/31/2013   Seizures (HCC)    don't know what kind; last one was ~ 1 yr ago (04/10/2016)   Sinus bradycardia     Skin ulcer of sacrum, limited to breakdown of skin (HCC) 03/08/2022   Spondylolisthesis at L3-L4 level 06/12/2022   Stroke (HCC) 1980s   denies residual on 04/10/2016   Thrush 09/19/2013   Type 2 diabetes mellitus (HCC) 10/18/2010    Past Surgical History:  Procedure Laterality Date   A/V FISTULAGRAM N/A 02/01/2023   Procedure: A/V Fistulagram;  Surgeon: Norine Manuelita LABOR, MD;  Location: [facility] INVASIVE CV LAB;  Service: Cardiovascular;  Laterality: N/A;   AV FISTULA PLACEMENT Left 12/21/2022   Procedure: INSERTION OF LEFT ARM ARTERIOVENOUS (AV) GORE-TEX GRAFT;  Surgeon: Magda Debby SAILOR, MD;  Location: [facility] OR;  Service: Vascular;  Laterality: Left;   CESAREAN SECTION  1997   COLONOSCOPY WITH PROPOFOL  N/A 04/01/2021   Procedure: COLONOSCOPY WITH PROPOFOL ;  Surgeon: Rollin Dover, MD;  Location: [facility] ENDOSCOPY;  Service: Gastroenterology;  Laterality: N/A;  Rectal bleeding with drop in hemoglobin to 7.2 g/dL   HERNIA REPAIR     IR CHOLANGIOGRAM EXISTING TUBE  07/20/2016   IR PERC CHOLECYSTOSTOMY  05/10/2016   IR RADIOLOGIST EVAL & MGMT  06/08/2016   IR RADIOLOGIST EVAL & MGMT  06/29/2016   IR SINUS/FIST TUBE CHK-NON GI  07/12/2016   RIGHT/LEFT HEART CATH AND CORONARY ANGIOGRAPHY N/A 06/19/2017   Procedure: RIGHT/LEFT HEART CATH AND CORONARY ANGIOGRAPHY;  Surgeon: Cherrie Toribio SAUNDERS, MD;  Location: [facility] INVASIVE CV LAB;  Service: Cardiovascular;  Laterality: N/A;   TIBIA IM NAIL INSERTION Right 07/12/2016   Procedure: INTRAMEDULLARY (IM) NAIL RIGHT TIBIA;  Surgeon: Jerri Kay HERO, MD;  Location: [facility] OR;  Service: Orthopedics;  Laterality: Right;   UMBILICAL HERNIA REPAIR  ~ 1963   that's why I don't have a belly button   VAGINAL HYSTERECTOMY      Current Medications: Current Facility-Administered Medications  Medication Dose Route Frequency Provider Last Rate Last Admin   albuterol  (VENTOLIN  HFA) 108 (90 Base) MCG/ACT inhaler 2 puff  2 puff Inhalation Q6H PRN Mannie Jerel PARAS, NP   2 puff at  09/02/23 2138   amLODipine  (NORVASC ) tablet 5 mg  5 mg Oral BID  Mannie Jerel PARAS, NP   5 mg at 09/04/23 2131   atorvastatin  (LIPITOR ) tablet 20 mg  20 mg Oral Daily Mannie Jerel PARAS, NP   20 mg at 09/04/23 1740   calcitRIOL  (ROCALTROL ) capsule 0.5 mcg  0.5 mcg Oral Daily Jakiah Bienaime, MD   0.5 mcg at 09/04/23 1742   capsaicin  (ZOSTRIX) 0.025 % cream   Topical BID PRN Onuoha, Chinwendu V, NP   Given at 09/02/23 2138   carvedilol  (COREG ) tablet 12.5 mg  12.5 mg Oral BID Mannie Jerel PARAS, NP   12.5 mg at 09/04/23 2130   Chlorhexidine  Gluconate Cloth 2 % PADS 6 each  6 each Topical Q0600 Druscilla Bald, NP   6 each at 09/01/23 9346   dicyclomine  (BENTYL ) capsule 10 mg  10 mg Oral TID Mannie Jerel PARAS, NP   10 mg at 09/04/23 2129   docusate sodium  (COLACE) capsule 100 mg  100 mg Oral Daily Angellina Ferdinand, MD   100 mg at 09/04/23 1741   epoetin  alfa-epbx (RETACRIT ) injection 10,000 Units  10,000 Units Intravenous Q T,Th,Sat-1800 Breeze, Bald, NP   10,000 Units at 09/04/23 1435   escitalopram  (LEXAPRO ) tablet 10 mg  10 mg Oral Daily Pamla Pangle, MD   10 mg at 09/04/23 1741   ezetimibe  (ZETIA ) tablet 10 mg  10 mg Oral Daily Jabreel Chimento, MD   10 mg at 09/04/23 1742   ferrous sulfate  tablet 325 mg  325 mg Oral Daily Aric Jost, MD   325 mg at 09/04/23 1741   folic acid  (FOLVITE ) tablet 1 mg  1 mg Oral Daily Nohealani Medinger, MD   1 mg at 09/04/23 1741   gabapentin  (NEURONTIN ) capsule 100 mg  100 mg Oral QHS Mannie Jerel PARAS, NP   100 mg at 09/04/23 2131   heparin  injection 2,200 Units  25 Units/kg Dialysis PRN Druscilla Bald, NP       hydrOXYzine  (ATARAX ) tablet 25 mg  25 mg Oral Q6H PRN Arizbeth Cawthorn, MD   25 mg at 09/04/23 2130   melatonin tablet 10 mg  10 mg Oral QHS Madaram, Kondal R, MD   10 mg at 09/04/23 2129   metolazone  (ZAROXOLYN ) tablet 2.5 mg  2.5 mg Oral Q Tiffany,W,F Virgie Kunda, MD   2.5 mg at 09/03/23 1716   mirtazapine  (REMERON ) tablet 15 mg  15 mg Oral QHS  Basim Bartnik, MD   15 mg at 09/04/23 2130   OLANZapine  (ZYPREXA ) injection 5 mg  5 mg Intramuscular TID PRN Mannie Jerel PARAS, NP       OLANZapine  zydis (ZYPREXA ) disintegrating tablet 5 mg  5 mg Oral TID PRN Mannie Jerel PARAS, NP   5 mg at 08/18/23 2318   pantoprazole  (PROTONIX ) EC tablet 40 mg  40 mg Oral Daily Briell Paulette, MD   40 mg at 09/04/23 1741   potassium chloride  (KLOR-CON  Tiffany) CR tablet 20 mEq  20 mEq Oral BID Druscilla Bald, NP   20 mEq at 09/04/23 2130   torsemide  (DEMADEX ) tablet 40 mg  40 mg Oral BID Mannie Jerel PARAS, NP   40 mg at 09/04/23 1743   traZODone  (DESYREL ) tablet 50 mg  50 mg Oral QHS PRN Ruther Millie SAUNDERS, MD        Lab Results:  Results for orders placed or performed during the hospital encounter of 08/15/23 (from the past 48 hours)  CBC with Differential/Platelet     Status: Abnormal   Collection Time: 09/03/23  1:07 PM  Result Value  Ref Range   WBC 6.2 4.0 - 10.5 K/uL   RBC 2.69 (L) 3.87 - 5.11 MIL/uL   Hemoglobin 7.6 (L) 12.0 - 15.0 g/dL   HCT 75.0 (L) 63.9 - 53.9 %   MCV 92.6 80.0 - 100.0 fL   MCH 28.3 26.0 - 34.0 pg   MCHC 30.5 30.0 - 36.0 g/dL   RDW 83.1 (H) 88.4 - 84.4 %   Platelets 339 150 - 400 K/uL   nRBC 0.0 0.0 - 0.2 %   Neutrophils Relative % 70 %   Neutro Abs 4.4 1.7 - 7.7 K/uL   Lymphocytes Relative 16 %   Lymphs Abs 1.0 0.7 - 4.0 K/uL   Monocytes Relative 8 %   Monocytes Absolute 0.5 0.1 - 1.0 K/uL   Eosinophils Relative 4 %   Eosinophils Absolute 0.2 0.0 - 0.5 K/uL   Basophils Relative 1 %   Basophils Absolute 0.0 0.0 - 0.1 K/uL   Immature Granulocytes 1 %   Abs Immature Granulocytes 0.03 0.00 - 0.07 K/uL    Comment: Performed at [facility], [address] [place], [place] [address]  Renal function panel     Status: Abnormal   Collection Time: 09/03/23  6:05 PM  Result Value Ref Range   Sodium 130 (L) 135 - 145 mmol/L   Potassium 3.0 (L) 3.5 - 5.1 mmol/L   Chloride 93 (L) 98 - 111 mmol/L   CO2 25 22 - 32  mmol/L   Glucose, Bld 106 (H) 70 - 99 mg/dL    Comment: Glucose reference range applies only to samples taken after fasting for at least 8 hours.   BUN 43 (H) 8 - 23 mg/dL   Creatinine, Ser 7.27 (H) 0.44 - 1.00 mg/dL   Calcium  9.1 8.9 - 10.3 mg/dL   Phosphorus 3.1 2.5 - 4.6 mg/dL   Albumin  3.2 (L) 3.5 - 5.0 g/dL   GFR, Estimated 19 (L) >60 mL/min    Comment: (NOTE) Calculated using the CKD-EPI Creatinine Equation (2021)    Anion gap 12 5 - 15    Comment: Performed at [facility], [address] [place], [place] [address]  CBC     Status: Abnormal   Collection Time: 09/04/23  3:00 PM  Result Value Ref Range   WBC 5.9 4.0 - 10.5 K/uL   RBC 2.63 (L) 3.87 - 5.11 MIL/uL   Hemoglobin 7.6 (L) 12.0 - 15.0 g/dL   HCT 75.5 (L) 63.9 - 53.9 %   MCV 92.8 80.0 - 100.0 fL   MCH 28.9 26.0 - 34.0 pg   MCHC 31.1 30.0 - 36.0 g/dL   RDW 82.9 (H) 88.4 - 84.4 %   Platelets 337 150 - 400 K/uL   nRBC 0.0 0.0 - 0.2 %    Comment: Performed at [facility], [address] [place], [place] [address]  Renal function panel     Status: Abnormal   Collection Time: 09/04/23  3:00 PM  Result Value Ref Range   Sodium 133 (L) 135 - 145 mmol/L   Potassium 3.2 (L) 3.5 - 5.1 mmol/L   Chloride 96 (L) 98 - 111 mmol/L   CO2 26 22 - 32 mmol/L   Glucose, Bld 118 (H) 70 - 99 mg/dL    Comment: Glucose reference range applies only to samples taken after fasting for at least 8 hours.   BUN 55 (H) 8 - 23 mg/dL   Creatinine, Ser 6.85 (H) 0.44 - 1.00 mg/dL  Calcium  9.6 8.9 - 10.3 mg/dL   Phosphorus 5.0 (H) 2.5 - 4.6 mg/dL   Albumin  3.3 (L) 3.5 - 5.0 g/dL   GFR, Estimated 16 (L) >60 mL/min    Comment: (NOTE) Calculated using the CKD-EPI Creatinine Equation (2021)    Anion gap 11 5 - 15    Comment: Performed at [facility], [address] [place], [place] [address]   *Note: Due to a large number of results and/or encounters for the requested time period, some results have not been  displayed. A complete set of results can be found in Results Review.      Blood Alcohol level:  Lab Results  Component Value Date   [facility] <15 05/31/2023   ETH <10 03/10/2023    Metabolic Disorder Labs: Lab Results  Component Value Date   HGBA1C 5.0 01/05/2022   MPG 99.67 06/17/2021   MPG 105.41 07/23/2020   No results found for: PROLACTIN Lab Results  Component Value Date   CHOL 151 11/14/2022   TRIG 100 11/14/2022   HDL 65 11/14/2022   CHOLHDL 2.3 11/14/2022   VLDL 20 11/14/2022   LDLCALC 66 11/14/2022   LDLCALC 54 08/05/2018    Physical Findings: AIMS:  , ,  ,  ,    CIWA:    COWS:      Psychiatric Specialty Exam:  Presentation  General Appearance:  Appropriate for Environment; Casual  Eye Contact: Fair  Speech: Clear and Coherent  Speech Volume: Normal    Mood and Affect  Mood: Normal Affect: Euthymic  Thought Process  Thought Processes: Disorganized But improving Descriptions of Associations:Intact  Orientation:Partial  Thought Content:Paranoid Ideation  Hallucinations: Auditory hallucinations  Ideas of Reference:Paranoia; Delusions  Suicidal Thoughts: Denies  Homicidal Thoughts: Denies   Sensorium  Memory: Immediate Fair; Recent Fair; Remote Poor  Judgment: Impaired  Insight: Shallow        Physical Exam: Physical Exam Vitals and nursing note reviewed.    ROS Blood pressure 138/81, pulse 77, temperature 98.1 F (36.7 C), resp. rate 18, weight 86.7 kg, SpO2 100%. Body mass index is 34.96 kg/Tiffany.  Diagnosis: Principal Problem:   Schizoaffective disorder [facility])   Clinical Decision Making: Patient with history of Schizoaffective disorder Bipolar type, end-stage renal disease admitted for worsening paranoia and delusions about people abusing her, out to get her.  Her paranoia worsened to the point where she refused to leave the house and skipped her dialysis for 5 days which led up to medical emergency.  Patient  needs inpatient psychiatric hospitalization for further stabilization.   Treatment Plan Summary:   Safety and Monitoring:             -- Voluntary admission to inpatient psychiatric unit for safety, stabilization and treatment             -- Daily contact with patient to assess and evaluate symptoms and progress in treatment             -- Patient's case to be discussed in multi-disciplinary team meeting             -- Observation Level: q15 minute checks             -- Vital signs:  q12 hours             -- Precautions: suicide, elopement, and assault   2. Psychiatric Diagnoses and Treatment:               Discontinued Zyprexa .  Invega  LAI 08/26/2023 Invega  9 mg daily-discontinued after the LAI to minimize daytime sedation. Lexapro  5 mg daily-Will titrate it up to 10 mg daily Prazosin    Patient has chronic delusions but exacerbated to the extent of not going to dialysis-   Will consider adding saphris  which is her home meds   -- The risks/benefits/side-effects/alternatives to this medication were discussed in detail with the patient and time was given for questions. The patient consents to medication trial.                -- Metabolic profile and EKG monitoring obtained while on an atypical antipsychotic (BMI: Lipid Panel: HbgA1c: QTc:)              -- Encouraged patient to participate in unit milieu and in scheduled group therapies                            3. Medical Issues Being Addressed:   ESRD: consulted nephrology for dialysis 4. Discharge Planning:   -- Social work and case management to assist with discharge planning and identification of hospital follow-up needs prior to discharge  -- Estimated LOS: 3-4 days  Brennon Otterness, MD 09/04/2023, 11:13 PM

## 2023-09-04 NOTE — Group Note (Signed)
 Recreation Therapy Group Note   Group Topic:Other  Group Date: 09/04/2023 Start Time: 1510 End Time: 1550 Facilitators: Celestia Gauze E, LRT; Raynaldo Lum [place], [place] Location: Courtyard  Group Description: Outdoor Recreation. Patients had the option to play corn hole, ring toss, bowling or listening to music while outside in the courtyard getting fresh air and sunlight. Patients helped water  and prune the raised garden beds. LRT and patients discussed things that they enjoy doing in their free time outside of the hospital. LRT encouraged patients to drink water  after being active and getting their heart rate up.   Goal Area(s) Addressed: Patient will identify leisure interests.  Patient will practice healthy decision making. Patient will engage in recreation activity.   Affect/Mood: N/A   Participation Level: Did not attend    Clinical Observations/Individualized Feedback: Patient did not attend group.  Plan: Continue to engage patient in RT group sessions 2-3x/week.   Gauze FORBES Celestia, LRT, CTRS 09/04/2023 5:40 PM

## 2023-09-04 NOTE — Progress Notes (Signed)
   09/04/23 0716  Psych Admission Type (Psych Patients Only)  Admission Status Voluntary  Psychosocial Assessment  Patient Complaints None  Eye Contact Fair  Facial Expression Animated  Affect Appropriate to circumstance  Speech Logical/coherent  Interaction Assertive  Motor Activity Other (Comment)  Appearance/Hygiene In scrubs  Behavior Characteristics Cooperative  Mood Pleasant  Thought Process  Coherency WDL  Content WDL  Delusions None reported or observed  Perception WDL  Hallucination None reported or observed  Judgment Impaired  Confusion Mild  Danger to Self  Current suicidal ideation? Denies

## 2023-09-04 NOTE — Plan of Care (Signed)
  Problem: Activity: Goal: Will verbalize the importance of balancing activity with adequate rest periods Outcome: Progressing   Problem: Education: Goal: Will be free of psychotic symptoms Outcome: Progressing Goal: Knowledge of the prescribed therapeutic regimen will improve Outcome: Progressing   Problem: Coping: Goal: Coping ability will improve Outcome: Progressing Goal: Will verbalize feelings Outcome: Progressing   Problem: Health Behavior/Discharge Planning: Goal: Compliance with prescribed medication regimen will improve Outcome: Progressing   

## 2023-09-04 NOTE — Progress Notes (Signed)
  Received patient in bed to unit.   Informed consent signed and in chart.    TX duration: 3:25     Transported back to floor  Hand-off given to patient's nurse. No c/o and no acute distress noted    Access used: R HD Catheter  Access issues: none   Total UF removed: 1.4L Medication(s) given: retacrit  Post HD VS: wnl Post HD weight: 86.7kg Pt stood for both pre and post weight.  However discrepancy in weights      Tiffany Hurst LPN [facility]

## 2023-09-04 NOTE — Group Note (Signed)
 Date:  09/04/2023 Time:  5:23 PM  Group Topic/Focus:  Building Self Esteem:   The Focus of this group is helping patients become aware of the effects of self-esteem on their lives, the things they and others do that enhance or undermine their self-esteem, seeing the relationship between their level of self-esteem and the choices they make and learning ways to enhance self-esteem.    Participation Level:  Did Not Attend  Participation Quality:    Affect:    Cognitive:    Insight:   Engagement in Group:    Modes of Intervention:    Additional Comments:    Jesiah Yerby 09/04/2023, 5:23 PM

## 2023-09-04 NOTE — Group Note (Signed)
 Physical/Occupational Therapy Group Note  Group Topic: Yoga  Group Date: 09/04/2023 Start Time: 1400 End Time: 1427 Facilitators: Frady Taddeo, Alm Hamilton, PT   Group Description: Group participated with series of yoga poses, designed to emphasize functional sitting balance, core stability, generalized flexibility and overall posture.  Incorporated deep breathing techniques with poses, working to promote relaxation, mindfulness and focus with targeted activities.   Discussed benefits of yoga in improving mood and self-esteem, reducing stress and anxiety, and promoting functional strength and balance for each participant.  Discussed ways to integrate into each participant's daily routine.  Provided handout with written and pictorial descriptions of included yoga movements to be utilized as appropriate outside of group time.  Therapeutic Goal(s):  Demonstrate safe ability to participate with yoga poses during group activity. Identify one benefit of participation with yoga poses as part of each participant's exercise/movement routine. Identify 1-2 individual poses that participant feels most beneficial to his/her needs and that he/she can easily replicate outside of group.  Individual Participation: Did not attend  Participation Level:   Participation Quality:   Behavior:   Speech/Thought Process:   Affect/Mood:   Insight:   Judgement:   Modes of Intervention:   Patient Response to Interventions:    Plan: Continue to engage patient in PT/OT groups 1 - 2x/week.  CHARM Hamilton Bertin PT, DPT 09/04/23, 4:49 PM

## 2023-09-04 NOTE — Progress Notes (Signed)
 [facility]  ROUNDING NOTE   Subjective:   Tiffany [name]  is a 63 y.o.  female  with an extensive past medical history including schizophrenia, hypertension, CHF, diabetes, stroke, and end-stage renal disease on hemodialysis. [name] currently admitted at inpatient behavioral health for Schizoaffective disorder [facility]) [F25.9]  Patient is known to our practice from previous admission.  States she has missed 5 days of dialysis outpatient.  Did not feel like going.  Patient seen and evaluated during.   Update: Patient seen and evaluated during dialysis   HEMODIALYSIS FLOWSHEET:  Blood Flow Rate (mL/min): 349 mL/min Arterial Pressure (mmHg): -141.41 mmHg Venous Pressure (mmHg): 119.59 mmHg TMP (mmHg): 18.58 mmHg Ultrafiltration Rate (mL/min): 862 mL/min Dialysate Flow Rate (mL/min): 299 ml/min Dialysis Fluid Bolus: Normal Saline Bolus Amount (mL): 200 mL  Appears in good spirits today  Objective:  Vital signs in last 24 hours:  Temp:  [98.1 F (36.7 C)-98.2 F (36.8 C)] 98.1 F (36.7 C) (08/19 1220) Pulse Rate:  [71-97] 87 (08/19 1330) Resp:  [17-20] 17 (08/19 1330) BP: (110-136)/(79-90) 120/81 (08/19 1330) SpO2:  [97 %-100 %] 100 % (08/19 1330) Weight:  [87.5 kg-89.2 kg] 89.2 kg (08/19 1235)  Weight change:  Filed Weights   09/03/23 1121 09/03/23 1412 09/04/23 1235  Weight: 88.4 kg 87.5 kg 89.2 kg    Intake/Output: I/O last 3 completed shifts: In: -  Out: 300 [Other:300]   Intake/Output this shift:  No intake/output data recorded.  Physical Exam: General: NAD, seated in chair  Head: Normocephalic, atraumatic. Moist oral mucosal membranes  Eyes: Anicteric  Lungs:  Clear to auscultation, normal effort  Heart: Regular rate and rhythm  Abdomen:  Soft, nontender  Extremities: No peripheral edema.  Neurologic: Awake, alert, conversant  Skin: Warm,dry, no rash  Access: Right chest IJ PermCath    Basic Metabolic Panel: Recent Labs  Lab  08/30/23 0700 09/02/23 1330 09/03/23 1805  NA 130* 132* 130*  K 2.7* 3.4* 3.0*  CL 91* 96* 93*  CO2 27 20* 25  GLUCOSE 102* 106* 106*  BUN 63* 79* 43*  CREATININE 4.00* 4.25* 2.72*  CALCIUM  9.4 9.6 9.1  PHOS 5.2*  --  3.1    Liver Function Tests: Recent Labs  Lab 08/30/23 0700 09/02/23 1330 09/03/23 1805  AST  --  16  --   ALT  --  11  --   ALKPHOS  --  115  --   BILITOT  --  0.6  --   PROT  --  7.3  --   ALBUMIN  3.2* 3.5 3.2*   No results for input(s): LIPASE, AMYLASE in the last 168 hours. No results for input(s): AMMONIA  in the last 168 hours.  CBC: Recent Labs  Lab 08/30/23 0700 09/03/23 1307  WBC 6.5 6.2  NEUTROABS  --  4.4  HGB 7.6* 7.6*  HCT 23.9* 24.9*  MCV 90.2 92.6  PLT 258 339    Cardiac Enzymes: No results for input(s): CKTOTAL, CKMB, CKMBINDEX, TROPONINI in the last 168 hours.  BNP: Invalid input(s): POCBNP  CBG: No results for input(s): GLUCAP in the last 168 hours.  Microbiology: Results for orders placed or performed during the hospital encounter of 08/13/23  SARS Coronavirus 2 by RT PCR (hospital order, performed in [facility] hospital lab) *cepheid single result test* Anterior Nasal Swab     Status: None   Collection Time: 08/15/23 11:07 AM   Specimen: Anterior Nasal Swab  Result Value Ref Range Status  SARS Coronavirus 2 by RT PCR NEGATIVE NEGATIVE Final    Comment: Performed at [facility], [address] [place], [place] [address]   *Note: Due to a large number of results and/or encounters for the requested time period, some results have not been displayed. A complete set of results can be found in Results Review.    Coagulation Studies: No results for input(s): LABPROT, INR in the last 72 hours.  Urinalysis: No results for input(s): COLORURINE, LABSPEC, PHURINE, GLUCOSEU, HGBUR, BILIRUBINUR, KETONESUR, PROTEINUR, UROBILINOGEN, NITRITE, LEUKOCYTESUR in the last 72  hours.  Invalid input(s): APPERANCEUR    Imaging: No results found.   Medications:     amLODipine   5 mg Oral BID   atorvastatin   20 mg Oral Daily   calcitRIOL   0.5 mcg Oral Daily   carvedilol   12.5 mg Oral BID   Chlorhexidine  Gluconate Cloth  6 each Topical Q0600   dicyclomine   10 mg Oral TID   docusate sodium   100 mg Oral Daily   epoetin  alfa-epbx (RETACRIT ) injection  10,000 Units Intravenous Q T,Th,Sat-1800   escitalopram   10 mg Oral Daily   ezetimibe   10 mg Oral Daily   ferrous sulfate   325 mg Oral Daily   folic acid   1 mg Oral Daily   gabapentin   100 mg Oral QHS   melatonin  10 mg Oral QHS   metolazone   2.5 mg Oral Q M,W,F   mirtazapine   15 mg Oral QHS   pantoprazole   40 mg Oral Daily   potassium chloride   20 mEq Oral BID   torsemide   40 mg Oral BID   albuterol , capsaicin , heparin , hydrOXYzine , OLANZapine , OLANZapine  zydis, traZODone   Assessment/ Plan:  [name] is a 63 y.o.  female  with an extensive past medical history including schizophrenia, hypertension, CHF, diabetes, stroke, and end-stage renal disease on hemodialysis.   [facility] [place]/TTS/Rt permcath Time 10:25am  End-stage renal disease on hemodialysis.  Short dialysis received yesterday due to missed treatment on Saturday. Agreeable to dialysis today to maintain outpatient schedule. Next treatment scheduled for Thursday.   2. Anemia of chronic kidney disease Lab Results  Component Value Date   HGB 7.6 (L) 09/03/2023    Hemoglobin 7.6.  Will receive Retacrit  10,000 units IV with dialysis.  3. Secondary Hyperparathyroidism: with outpatient labs: PTH 1310, phosphorus 5.9, calcium  8.4 on 07/26/2023.   Lab Results  Component Value Date   PTH 57 02/18/2020   CALCIUM  9.1 09/03/2023   CAION 1.03 (L) 06/14/2023   PHOS 3.1 09/03/2023   Bone minerals remain stable   LOS: 20 Darbi Chandran 8/19/20251:52 PM

## 2023-09-05 MED ORDER — LOPERAMIDE HCL 2 MG PO CAPS
4.0000 mg | ORAL_CAPSULE | ORAL | Status: DC | PRN
  Administered 2023-09-05: 4 mg via ORAL
  Filled 2023-09-05: qty 2

## 2023-09-05 NOTE — Plan of Care (Signed)
  Problem: Education: Goal: Knowledge of the prescribed therapeutic regimen will improve Outcome: Progressing   Problem: Coping: Goal: Coping ability will improve Outcome: Progressing   

## 2023-09-05 NOTE — Group Note (Signed)
 [facility] LCSW Group Therapy Note   Group Date: 09/05/2023 Start Time: 1330 End Time: 1400   Type of Therapy/Topic:  Group Therapy:  Emotion Regulation  Participation Level:  Active   Mood:  Description of Group:    The purpose of this group is to assist patients in learning to regulate negative emotions and experience positive emotions. Patients will be guided to discuss ways in which they have been vulnerable to their negative emotions. These vulnerabilities will be juxtaposed with experiences of positive emotions or situations, and patients challenged to use positive emotions to combat negative ones. Special emphasis will be placed on coping with negative emotions in conflict situations, and patients will process healthy conflict resolution skills.  Therapeutic Goals: Patient will identify two positive emotions or experiences to reflect on in order to balance out negative emotions:  Patient will label two or more emotions that they find the most difficult to experience:  Patient will be able to demonstrate positive conflict resolution skills through discussion or role plays:   Summary of Patient Progress:     Pt participated in group appropriately and interacted appropriately with peers    Therapeutic Modalities:   Cognitive Behavioral Therapy Feelings Identification Dialectical Behavioral Therapy   Tiffany [name], LCSWA

## 2023-09-05 NOTE — Group Note (Signed)
 Date:  09/05/2023 Time:  10:54 AM  Group Topic/Focus:  Coping With Mental Health Crisis:   The purpose of this group is to help patients identify strategies for coping with mental health crisis.  Group discusses possible causes of crisis and ways to manage them effectively.    Participation Level:  Active  Participation Quality:  Appropriate  Affect:  Appropriate  Cognitive:  Appropriate  Insight: Appropriate  Engagement in Group:  Engaged  Modes of Intervention:  Discussion  Tiffany [name] 09/05/2023, 10:54 AM

## 2023-09-05 NOTE — Progress Notes (Signed)
 [facility] MD Progress Note  09/05/2023 9:42 PM Tiffany [name]  MRN:  [id number]  [name] is a 63 y.o. female admitted: Presented to the [facility] 08/13/2023  7:48 PM for after presenting due to a fall later patient exhibit extreme paranoia. She carries the psychiatric diagnoses of PTSD, MDD with and without psychotic features, schizoaffective disorder, and history of polysubstance use and has a past medical history of hypertension, ESRD with HD, congestive heart failure, diabetes, COPD, , stroke. Her current presentation of paranoia, delusional thoughts, auditory and visual hallucinations is most consistent with schizoaffective disorder. She meets criteria for inpatient psychiatric admission based on current symptom Patient is admitted to [facility] unit with Q15 min safety monitoring. Multidisciplinary team approach is offered. Medication management; group/milieu therapy is offered.    Subjective: Chart is reviewed and discussed the case with treatment team.  On interview patient is noted to be in a good mood.  She reports that domestic violence shelters have no beds at this time.  [name] willing to look into boarding houses or hotels after discharge.  [name] aware that she needs to make sure she has access to her dialysis.  She denies SI/HI/plan and denies hallucinations.  She reports her depression as 4 out of 10, 10 being the worst and anxiety is 4 out of 10.  She had her dialysis today and had no complications. Sleep: Fair  Appetite:  Fair  Past Psychiatric History: see h&P Family History:  Family History  Problem Relation Age of Onset   Cancer Mother        lung   Depression Mother    Cancer Father        prostate   Depression Sister    Anxiety disorder Sister    Schizophrenia Sister    Bipolar disorder Sister    Depression Sister    Depression Brother    Heart failure Other        cousin   Social History:  Social History   Substance and Sexual Activity  Alcohol Use No    Alcohol/week: 0.0 standard drinks of alcohol     Social History   Substance and Sexual Activity  Drug Use Not Currently   Types: Cocaine   Comment: 04/10/2016 last used cocaine back in November 2017    Social History   Socioeconomic History   Marital status: Widowed    Spouse name: Not on file   Number of children: 3   Years of education: 12th   Highest education level: 12th grade  Occupational History   Occupation: disabled    Comment: factory production  Tobacco Use   Smoking status: Former    Current packs/day: 0.00    Average packs/day: 1.5 packs/day for 39.1 years (58.6 ttl pk-yrs)    Types: Cigarettes    Start date: 03/13/1977    Quit date: 04/10/2016    Years since quitting: 7.4    Passive exposure: Past   Smokeless tobacco: Never  Vaping Use   Vaping status: Never Used  Substance and Sexual Activity   Alcohol use: No    Alcohol/week: 0.0 standard drinks of alcohol   Drug use: Not Currently    Types: Cocaine    Comment: 04/10/2016 last used cocaine back in November 2017   Sexual activity: Not Currently    Birth control/protection: Surgical  Other Topics Concern   Not on file  Social History Narrative   Has 1 son, [name]   Lives with son and his  boyfriend   Her house has ramps and handrails should she ever needs them.    Her mother lives down the street from her and is a good support person in addition to her son.   She drives herself, has private transportation.    Cocaine free since 02/24/16, smoke free since 04/10/16   Dialysis M-W-F   Social Drivers of Health   Financial Resource Strain: Low Risk  (07/11/2022)   Overall Financial Resource Strain (CARDIA)    Difficulty of Paying Living Expenses: Not very hard  Food Insecurity: Food Insecurity Present (08/15/2023)   Hunger Vital Sign    Worried About Running Out of Food in the Last Year: Sometimes true    Ran Out of Food in the Last Year: Sometimes true  Transportation Needs: No Transportation Needs  (08/15/2023)   PRAPARE - Administrator, Civil Service (Medical): No    Lack of Transportation (Non-Medical): No  Physical Activity: Inactive (07/11/2022)   Exercise Vital Sign    Days of Exercise per Week: 0 days    Minutes of Exercise per Session: 0 min  Stress: Stress Concern Present (07/11/2022)   [employer] of Occupational Health - Occupational Stress Questionnaire    Feeling of Stress : To some extent  Social Connections: Socially Isolated (06/28/2023)   Social Connection and Isolation Panel    Frequency of Communication with Friends and Family: Three times a week    Frequency of Social Gatherings with Friends and Family: Three times a week    Attends Religious Services: Never    Active Member of Clubs or Organizations: No    Attends Banker Meetings: Never    Marital Status: Widowed   Past Medical History:  Past Medical History:  Diagnosis Date   Abdominal pain    Acute encephalopathy 11/21/2017   Acute exacerbation of CHF (congestive heart failure) (HCC) 03/03/2022   Acute GI bleeding 03/29/2021   Acute metabolic encephalopathy 02/20/2020   Aggressive behavior    Agitation 11/22/2017   Anoxic brain injury (HCC) 09/08/2016   C. Arrest due to respiratory failure and COPD exacerbation   Anxiety    Arthritis    all over (04/10/2016)   Asthma 10/18/2010   Binge eating disorder    Blood loss anemia 04/24/2011   CAP (community acquired pneumonia) 06/22/2015   Cardiac arrest (HCC) 09/08/2016   PEA   Carotid artery stenosis    1-39% bilateral by dopplers 11/2016   Chronic diastolic (congestive) heart failure (HCC)    Chronic pain syndrome 06/18/2012   Chronic post-traumatic stress disorder (PTSD) 05/27/2018   Chronic respiratory failure with hypoxia and hypercapnia (HCC) 06/22/2015   TRILOGY Vent >AVAPA-ES., Vt target 200-400, Max P 30 , PS max 20 , PS min 6-10 , E Max 6, E Min 4, Rate Auto AVAPS Rate 2 (titrate for pt comfort) , bleed O2 at  5l/m continuous flow .    Closed displaced fracture of fifth metacarpal bone 03/21/2018   Cocaine use disorder, severe, in sustained remission (HCC) 12/17/2015   Complication of anesthesia    decreased bp, decreased heart rate   COPD (chronic obstructive pulmonary disease) (HCC) 07/08/2014   Delusional disorder, persecutory type (HCC) 06/26/2021   Depression    Diabetic neuropathy (HCC) 04/24/2011   Difficulty with speech 01/24/2018   Disorder of nervous system    Drug abuse (HCC) 11/21/2017   Dyslipidemia 04/24/2011   E. coli UTI 02/20/2020   Elevated troponin 04/28/2012   Emphysema  Encephalopathy 11/21/2017   Essential hypertension 03/22/2016   Facet arthropathy, lumbar 06/12/2022   Fibula fracture 07/10/2016   Frequent falls 10/11/2017   GERD (gastroesophageal reflux disease)    GI bleed 03/30/2021   Gout 04/11/2017   Heart attack (HCC) 1980s   Hematochezia    History of blood transfusion 1994   couldn't stop bleeding from my period   History of drug abuse in remission (HCC) 11/28/2015   Quit in 2017   Hyperlipidemia 03/31/2021   Hyperlipidemia LDL goal <70    Hypocalcemia    Hypokalemia    Hypomagnesemia    Incontinence    Manic depression (HCC)    Morbid obesity (HCC) 10/18/2010   Obstructive sleep apnea 10/18/2010   On home oxygen  therapy    6L; 24/7 (04/10/2016)   OSA on CPAP    wear mask sometimes (04/10/2016)   Painless rectal bleeding 12/31/2015   Paranoid (HCC)    sometimes; I'm on RX for it (04/10/2016)   Periumbilical abdominal pain    Prolonged Q-T interval on ECG    Rectal bleeding 12/31/2015   Recurrent syncope 07/09/2021   Rhabdomyolysis 06/16/2021   Schizoaffective disorder, bipolar type (HCC) 04/05/2018   Seasonal allergies    Seborrheic keratoses 12/31/2013   Seizures (HCC)    don't know what kind; last one was ~ 1 yr ago (04/10/2016)   Sinus bradycardia    Skin ulcer of sacrum, limited to breakdown of skin (HCC) 03/08/2022    Spondylolisthesis at L3-L4 level 06/12/2022   Stroke (HCC) 1980s   denies residual on 04/10/2016   Thrush 09/19/2013   Type 2 diabetes mellitus (HCC) 10/18/2010    Past Surgical History:  Procedure Laterality Date   A/V FISTULAGRAM N/A 02/01/2023   Procedure: A/V Fistulagram;  Surgeon: Norine Manuelita LABOR, MD;  Location: [facility] INVASIVE CV LAB;  Service: Cardiovascular;  Laterality: N/A;   AV FISTULA PLACEMENT Left 12/21/2022   Procedure: INSERTION OF LEFT ARM ARTERIOVENOUS (AV) GORE-TEX GRAFT;  Surgeon: Magda Debby SAILOR, MD;  Location: [facility] OR;  Service: Vascular;  Laterality: Left;   CESAREAN SECTION  1997   COLONOSCOPY WITH PROPOFOL  N/A 04/01/2021   Procedure: COLONOSCOPY WITH PROPOFOL ;  Surgeon: Rollin Dover, MD;  Location: [facility] ENDOSCOPY;  Service: Gastroenterology;  Laterality: N/A;  Rectal bleeding with drop in hemoglobin to 7.2 g/dL   HERNIA REPAIR     IR CHOLANGIOGRAM EXISTING TUBE  07/20/2016   IR PERC CHOLECYSTOSTOMY  05/10/2016   IR RADIOLOGIST EVAL & MGMT  06/08/2016   IR RADIOLOGIST EVAL & MGMT  06/29/2016   IR SINUS/FIST TUBE CHK-NON GI  07/12/2016   RIGHT/LEFT HEART CATH AND CORONARY ANGIOGRAPHY N/A 06/19/2017   Procedure: RIGHT/LEFT HEART CATH AND CORONARY ANGIOGRAPHY;  Surgeon: Cherrie Toribio SAUNDERS, MD;  Location: [facility] INVASIVE CV LAB;  Service: Cardiovascular;  Laterality: N/A;   TIBIA IM NAIL INSERTION Right 07/12/2016   Procedure: INTRAMEDULLARY (IM) NAIL RIGHT TIBIA;  Surgeon: Jerri Kay HERO, MD;  Location: [facility] OR;  Service: Orthopedics;  Laterality: Right;   UMBILICAL HERNIA REPAIR  ~ 1963   that's why I don't have a belly button   VAGINAL HYSTERECTOMY      Current Medications: Current Facility-Administered Medications  Medication Dose Route Frequency Provider Last Rate Last Admin   albuterol  (VENTOLIN  HFA) 108 (90 Base) MCG/ACT inhaler 2 puff  2 puff Inhalation Q6H PRN Mannie Jerel PARAS, NP   2 puff at 09/02/23 2138   amLODipine  (NORVASC ) tablet 5 mg  5 mg Oral BID Mannie,  Jerel PARAS, NP   5 mg at 09/05/23 1115   atorvastatin  (LIPITOR ) tablet 20 mg  20 mg Oral Daily Mannie Jerel PARAS, NP   20 mg at 09/05/23 1114   calcitRIOL  (ROCALTROL ) capsule 0.5 mcg  0.5 mcg Oral Daily Jaymason Ledesma, MD   0.5 mcg at 09/05/23 1119   capsaicin  (ZOSTRIX) 0.025 % cream   Topical BID PRN Onuoha, Chinwendu V, NP   Given at 09/02/23 2138   carvedilol  (COREG ) tablet 12.5 mg  12.5 mg Oral BID Mannie Jerel PARAS, NP   12.5 mg at 09/05/23 1115   Chlorhexidine  Gluconate Cloth 2 % PADS 6 each  6 each Topical Q0600 Druscilla Bald, NP   6 each at 09/01/23 9346   dicyclomine  (BENTYL ) capsule 10 mg  10 mg Oral TID Mannie Jerel PARAS, NP   10 mg at 09/05/23 1654   docusate sodium  (COLACE) capsule 100 mg  100 mg Oral Daily Khiem Gargis, MD   100 mg at 09/04/23 1741   epoetin  alfa-epbx (RETACRIT ) injection 10,000 Units  10,000 Units Intravenous Q T,Th,Sat-1800 Breeze, Bald, NP   10,000 Units at 09/04/23 1435   escitalopram  (LEXAPRO ) tablet 10 mg  10 mg Oral Daily Amillion Scobee, MD   10 mg at 09/05/23 1114   ezetimibe  (ZETIA ) tablet 10 mg  10 mg Oral Daily Johniya Durfee, MD   10 mg at 09/05/23 1118   ferrous sulfate  tablet 325 mg  325 mg Oral Daily Kaesen Rodriguez, MD   325 mg at 09/05/23 1115   folic acid  (FOLVITE ) tablet 1 mg  1 mg Oral Daily Wilmary Levit, MD   1 mg at 09/05/23 1114   gabapentin  (NEURONTIN ) capsule 100 mg  100 mg Oral QHS Mannie Jerel PARAS, NP   100 mg at 09/04/23 2131   hydrOXYzine  (ATARAX ) tablet 25 mg  25 mg Oral Q6H PRN Maygen Sirico, MD   25 mg at 09/04/23 2130   loperamide  (IMODIUM ) capsule 4 mg  4 mg Oral PRN Diallo Ponder, MD   4 mg at 09/05/23 1726   melatonin tablet 10 mg  10 mg Oral QHS Madaram, Kondal R, MD   10 mg at 09/04/23 2129   metolazone  (ZAROXOLYN ) tablet 2.5 mg  2.5 mg Oral Q M,W,F Aalaysia Liggins, MD   2.5 mg at 09/05/23 1118   mirtazapine  (REMERON ) tablet 15 mg  15 mg Oral QHS Keyli Duross, MD   15 mg at 09/04/23 2130   OLANZapine  (ZYPREXA )  injection 5 mg  5 mg Intramuscular TID PRN Mannie Jerel PARAS, NP       OLANZapine  zydis (ZYPREXA ) disintegrating tablet 5 mg  5 mg Oral TID PRN Mannie Jerel PARAS, NP   5 mg at 08/18/23 2318   pantoprazole  (PROTONIX ) EC tablet 40 mg  40 mg Oral Daily Lynee Rosenbach, MD   40 mg at 09/05/23 1115   potassium chloride  (KLOR-CON  M) CR tablet 20 mEq  20 mEq Oral BID Druscilla Bald, NP   20 mEq at 09/05/23 1115   torsemide  (DEMADEX ) tablet 40 mg  40 mg Oral BID Mannie Jerel PARAS, NP   40 mg at 09/05/23 1722   traZODone  (DESYREL ) tablet 50 mg  50 mg Oral QHS PRN Ruther Millie SAUNDERS, MD        Lab Results:  Results for orders placed or performed during the hospital encounter of 08/15/23 (from the past 48 hours)  CBC     Status: Abnormal   Collection Time: 09/04/23  3:00 PM  Result  Value Ref Range   WBC 5.9 4.0 - 10.5 K/uL   RBC 2.63 (L) 3.87 - 5.11 MIL/uL   Hemoglobin 7.6 (L) 12.0 - 15.0 g/dL   HCT 75.5 (L) 63.9 - 53.9 %   MCV 92.8 80.0 - 100.0 fL   MCH 28.9 26.0 - 34.0 pg   MCHC 31.1 30.0 - 36.0 g/dL   RDW 82.9 (H) 88.4 - 84.4 %   Platelets 337 150 - 400 K/uL   nRBC 0.0 0.0 - 0.2 %    Comment: Performed at [facility], [address] [place], [place] [address]  Renal function panel     Status: Abnormal   Collection Time: 09/04/23  3:00 PM  Result Value Ref Range   Sodium 133 (L) 135 - 145 mmol/L   Potassium 3.2 (L) 3.5 - 5.1 mmol/L   Chloride 96 (L) 98 - 111 mmol/L   CO2 26 22 - 32 mmol/L   Glucose, Bld 118 (H) 70 - 99 mg/dL    Comment: Glucose reference range applies only to samples taken after fasting for at least 8 hours.   BUN 55 (H) 8 - 23 mg/dL   Creatinine, Ser 6.85 (H) 0.44 - 1.00 mg/dL   Calcium  9.6 8.9 - 10.3 mg/dL   Phosphorus 5.0 (H) 2.5 - 4.6 mg/dL   Albumin  3.3 (L) 3.5 - 5.0 g/dL   GFR, Estimated 16 (L) >60 mL/min    Comment: (NOTE) Calculated using the CKD-EPI Creatinine Equation (2021)    Anion gap 11 5 - 15    Comment: Performed at [facility],  [address], [place], [place] [address]   *Note: Due to a large number of results and/or encounters for the requested time period, some results have not been displayed. A complete set of results can be found in Results Review.      Blood Alcohol level:  Lab Results  Component Value Date   [facility] <15 05/31/2023   ETH <10 03/10/2023    Metabolic Disorder Labs: Lab Results  Component Value Date   HGBA1C 5.0 01/05/2022   MPG 99.67 06/17/2021   MPG 105.41 07/23/2020   No results found for: PROLACTIN Lab Results  Component Value Date   CHOL 151 11/14/2022   TRIG 100 11/14/2022   HDL 65 11/14/2022   CHOLHDL 2.3 11/14/2022   VLDL 20 11/14/2022   LDLCALC 66 11/14/2022   LDLCALC 54 08/05/2018    Physical Findings: AIMS:  , ,  ,  ,    CIWA:    COWS:      Psychiatric Specialty Exam:  Presentation  General Appearance:  Appropriate for Environment; Casual  Eye Contact: Fair  Speech: Clear and Coherent  Speech Volume: Normal    Mood and Affect  Mood: Normal Affect: Euthymic  Thought Process  Thought Processes: Disorganized But improving Descriptions of Associations:Intact  Orientation:Partial  Thought Content:Paranoid Ideation  Hallucinations: Auditory hallucinations  Ideas of Reference:Paranoia; Delusions  Suicidal Thoughts: Denies  Homicidal Thoughts: Denies   Sensorium  Memory: Immediate Fair; Recent Fair; Remote Poor  Judgment: Impaired  Insight: Shallow        Physical Exam: Physical Exam Vitals and nursing note reviewed.    ROS Blood pressure 129/84, pulse 78, temperature 97.9 F (36.6 C), resp. rate 14, weight 86.7 kg, SpO2 100%. Body mass index is 34.96 kg/m.  Diagnosis: Principal Problem:   Schizoaffective disorder [facility])   Clinical Decision Making: Patient with history of Schizoaffective disorder Bipolar type, end-stage renal disease admitted  for worsening paranoia and delusions about people abusing her, out to  get her.  Her paranoia worsened to the point where she refused to leave the house and skipped her dialysis for 5 days which led up to medical emergency.  Patient needs inpatient psychiatric hospitalization for further stabilization.   Treatment Plan Summary:   Safety and Monitoring:             -- Voluntary admission to inpatient psychiatric unit for safety, stabilization and treatment             -- Daily contact with patient to assess and evaluate symptoms and progress in treatment             -- Patient's case to be discussed in multi-disciplinary team meeting             -- Observation Level: q15 minute checks             -- Vital signs:  q12 hours             -- Precautions: suicide, elopement, and assault   2. Psychiatric Diagnoses and Treatment:               Discontinued Zyprexa .    Invega  LAI 08/26/2023 Invega  9 mg daily-discontinued after the LAI to minimize daytime sedation. Lexapro  10 mg daily Prazosin    Patient has chronic delusions but exacerbated to the extent of not going to dialysis-   Will consider adding saphris  which is her home meds   -- The risks/benefits/side-effects/alternatives to this medication were discussed in detail with the patient and time was given for questions. The patient consents to medication trial.                -- Metabolic profile and EKG monitoring obtained while on an atypical antipsychotic (BMI: Lipid Panel: HbgA1c: QTc:)              -- Encouraged patient to participate in unit milieu and in scheduled group therapies                            3. Medical Issues Being Addressed:   ESRD: consulted nephrology for dialysis 4. Discharge Planning:   -- Social work and case management to assist with discharge planning and identification of hospital follow-up needs prior to discharge  -- Estimated LOS: 3-4 days  Allyn Foil, MD 09/05/2023, 9:42 PM

## 2023-09-05 NOTE — Plan of Care (Signed)
  Problem: Education: Goal: Knowledge of the prescribed therapeutic regimen will improve Outcome: Progressing   Problem: Coping: Goal: Coping ability will improve Outcome: Progressing Goal: Will verbalize feelings Outcome: Progressing   Problem: Role Relationship: Goal: Ability to communicate needs accurately will improve Outcome: Progressing   Problem: Self-Care: Goal: Ability to participate in self-care as condition permits will improve Outcome: Progressing

## 2023-09-05 NOTE — Group Note (Signed)
 Date:  09/05/2023 Time:  11:19 PM  Group Topic/Focus:  Wrap-Up Group:   The focus of this group is to help patients review their daily goal of treatment and discuss progress on daily workbooks.    Participation Level:  Active  Participation Quality:  Appropriate  Affect:  Appropriate  Cognitive:  Alert  Insight: Appropriate  Engagement in Group:  Engaged  Modes of Intervention:  Discussion  Additional Comments:    Tiffany [name] CHRISTELLA Bunker 09/05/2023, 11:19 PM

## 2023-09-05 NOTE — Progress Notes (Signed)
   09/05/23 1600  Psychosocial Assessment  Patient Complaints None  Eye Contact Fair  Facial Expression Animated  Affect Appropriate to circumstance  Speech Logical/coherent  Interaction Assertive  Motor Activity Unsteady  Appearance/Hygiene In scrubs  Behavior Characteristics Cooperative  Mood Pleasant  Thought Process  Coherency WDL  Content WDL  Delusions WDL;None reported or observed  Perception WDL  Hallucination None reported or observed  Judgment Limited  Confusion Mild  Danger to Self  Current suicidal ideation? Denies  Danger to Others  Danger to Others None reported or observed

## 2023-09-05 NOTE — Progress Notes (Signed)
 Patient was up and visible on the unit since the onset of the shift at intervals. Appropriately social and interactive with both peers and staff. Spent most of the evening talking on the telephone, making personal calls. Compliant with all aspects of nursing care offered. Requested PRN atarax  25mg  P.O. to assist with restlessness, which was noted to be effective. Verbally contracted for safety. Slept most of the night. All orders maintained as written.   Estimated Sleeping Duration (Last 24 Hours): 5.00-6.25 hours     09/05/23 0002  Psych Admission Type (Psych Patients Only)  Admission Status Voluntary  Psychosocial Assessment  Patient Complaints None  Eye Contact Fair  Facial Expression Animated  Affect Appropriate to circumstance  Speech Logical/coherent  Interaction Assertive  Motor Activity Unsteady  Appearance/Hygiene In scrubs  Behavior Characteristics Cooperative  Mood Pleasant  Thought Process  Coherency WDL  Content WDL  Delusions None reported or observed  Perception WDL  Hallucination None reported or observed  Judgment Impaired  Confusion Mild  Danger to Self  Current suicidal ideation? Denies  Danger to Others  Danger to Others None reported or observed

## 2023-09-06 ENCOUNTER — Encounter (HOSPITAL_COMMUNITY): Payer: Self-pay

## 2023-09-06 ENCOUNTER — Other Ambulatory Visit: Payer: Self-pay

## 2023-09-06 LAB — RENAL FUNCTION PANEL
Albumin: 3.4 g/dL — ABNORMAL LOW (ref 3.5–5.0)
Anion gap: 14 (ref 5–15)
BUN: 45 mg/dL — ABNORMAL HIGH (ref 8–23)
CO2: 23 mmol/L (ref 22–32)
Calcium: 9.5 mg/dL (ref 8.9–10.3)
Chloride: 93 mmol/L — ABNORMAL LOW (ref 98–111)
Creatinine, Ser: 3.41 mg/dL — ABNORMAL HIGH (ref 0.44–1.00)
GFR, Estimated: 14 mL/min — ABNORMAL LOW (ref >60)
Glucose, Bld: 82 mg/dL (ref 70–99)
Phosphorus: 4.8 mg/dL — ABNORMAL HIGH (ref 2.5–4.6)
Potassium: 3.2 mmol/L — ABNORMAL LOW (ref 3.5–5.1)
Sodium: 130 mmol/L — ABNORMAL LOW (ref 135–145)

## 2023-09-06 LAB — CBC
HCT: 27.7 % — ABNORMAL LOW (ref 36.0–46.0)
Hemoglobin: 8.6 g/dL — ABNORMAL LOW (ref 12.0–15.0)
MCH: 28.6 pg (ref 26.0–34.0)
MCHC: 31 g/dL (ref 30.0–36.0)
MCV: 92 fL (ref 80.0–100.0)
Platelets: 303 K/uL (ref 150–400)
RBC: 3.01 MIL/uL — ABNORMAL LOW (ref 3.87–5.11)
RDW: 16.9 % — ABNORMAL HIGH (ref 11.5–15.5)
WBC: 6.4 K/uL (ref 4.0–10.5)
nRBC: 0 % (ref 0.0–0.2)

## 2023-09-06 MED ORDER — EZETIMIBE 10 MG PO TABS
10.0000 mg | ORAL_TABLET | Freq: Every day | ORAL | 0 refills | Status: AC
  Filled 2023-09-06 (×2): qty 30, 30d supply, fill #0

## 2023-09-06 MED ORDER — DOCUSATE SODIUM 100 MG PO CAPS
100.0000 mg | ORAL_CAPSULE | Freq: Every day | ORAL | 0 refills | Status: AC
  Filled 2023-09-06: qty 10, 10d supply, fill #0

## 2023-09-06 MED ORDER — HEPARIN SODIUM (PORCINE) 1000 UNIT/ML IJ SOLN
INTRAMUSCULAR | Status: AC
  Filled 2023-09-06: qty 5

## 2023-09-06 MED ORDER — EPOETIN ALFA-EPBX 10000 UNIT/ML IJ SOLN
INTRAMUSCULAR | Status: AC
  Filled 2023-09-06: qty 1

## 2023-09-06 MED ORDER — DULOXETINE HCL 30 MG PO CPEP
30.0000 mg | ORAL_CAPSULE | Freq: Every day | ORAL | 0 refills | Status: AC
  Filled 2023-09-06 (×2): qty 30, 30d supply, fill #0

## 2023-09-06 MED ORDER — PANTOPRAZOLE SODIUM 40 MG PO TBEC
40.0000 mg | DELAYED_RELEASE_TABLET | Freq: Every day | ORAL | 0 refills | Status: DC
  Filled 2023-09-06: qty 30, 30d supply, fill #0

## 2023-09-06 MED ORDER — POTASSIUM CHLORIDE CRYS ER 20 MEQ PO TBCR
20.0000 meq | EXTENDED_RELEASE_TABLET | Freq: Two times a day (BID) | ORAL | 0 refills | Status: DC
  Filled 2023-09-06: qty 60, 30d supply, fill #0

## 2023-09-06 MED ORDER — ESCITALOPRAM OXALATE 10 MG PO TABS
10.0000 mg | ORAL_TABLET | Freq: Every day | ORAL | 0 refills | Status: DC
  Filled 2023-09-06 (×2): qty 90, 90d supply, fill #0

## 2023-09-06 MED ORDER — HEPARIN SODIUM (PORCINE) 1000 UNIT/ML DIALYSIS: 25.0000 [IU]/kg | INTRAMUSCULAR | Status: DC | PRN

## 2023-09-06 MED ORDER — MELATONIN 10 MG PO TBDP
10.0000 mg | ORAL_TABLET | Freq: Every day | ORAL | 0 refills | Status: DC
  Filled 2023-09-06: qty 30, 30d supply, fill #0

## 2023-09-06 MED ORDER — AMLODIPINE BESYLATE 5 MG PO TABS
5.0000 mg | ORAL_TABLET | Freq: Two times a day (BID) | ORAL | 0 refills | Status: DC
  Filled 2023-09-06: qty 30, 15d supply, fill #0
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
## 2023-09-06 NOTE — BHH Suicide Risk Assessment (Signed)
 [facility] Discharge Suicide Risk Assessment   Principal Problem: Schizoaffective disorder [facility]) Discharge Diagnoses: Principal Problem:   Schizoaffective disorder (HCC)   Total Time spent with patient: 30 minutes  Musculoskeletal: Strength & Muscle Tone: decreased Gait & Station: normal Patient leans: N/A  Psychiatric Specialty Exam  Presentation  General Appearance:  Appropriate for Environment; Casual  Eye Contact: Fair  Speech: Clear and Coherent  Speech Volume: Normal  Handedness: Right   Mood and Affect  Mood: Euthymic  Duration of Depression Symptoms: Greater than two weeks  Affect: Appropriate   Thought Process  Thought Processes: Coherent  Descriptions of Associations:Intact  Orientation:Full (Time, Place and Person)  Thought Content:Logical  History of Schizophrenia/Schizoaffective disorder:Yes  Duration of Psychotic Symptoms:Greater than six months  Hallucinations:Hallucinations: None  Ideas of Reference:None  Suicidal Thoughts:Suicidal Thoughts: No  Homicidal Thoughts:Homicidal Thoughts: No   Sensorium  Memory: Immediate Fair; Recent Fair; Remote Fair  Judgment: Fair  Insight: Fair   Art therapist  Concentration: Fair  Attention Span: Fair  Recall: Fair  Fund of Knowledge: Fair  Language: Fair   Psychomotor Activity  Psychomotor Activity: Psychomotor Activity: Normal   Assets  Assets: Desire for Improvement   Sleep  Sleep: Sleep: Fair  Estimated Sleeping Duration (Last 24 Hours): 3.25-4.50 hours  Physical Exam: Physical Exam ROS Blood pressure (!) 158/98, pulse 93, temperature 98.5 F (36.9 C), temperature source Oral, resp. rate (!) 27, weight 89.3 kg, SpO2 99%. Body mass index is 36.01 kg/m.  Mental Status Per Nursing Assessment::   On Admission:  NA  Demographic Factors:  Living alone  Loss Factors: Decrease in vocational status  Historical Factors: Impulsivity  Risk  Reduction Factors:   Positive social support, Positive therapeutic relationship, and Positive coping skills or problem solving skills  Continued Clinical Symptoms:  Schizophrenia:   Paranoid or undifferentiated type  Cognitive Features That Contribute To Risk:  None    Suicide Risk:  Minimal: No identifiable suicidal ideation.  Patients presenting with no risk factors but with morbid ruminations; may be classified as minimal risk based on the severity of the depressive symptoms    Plan Of Care/Follow-up recommendations:  Activity:  As Tolerated  Allyn Foil, MD 09/06/2023, 4:18 PM

## 2023-09-06 NOTE — Progress Notes (Signed)
 [facility]  ROUNDING NOTE   Subjective:   Tiffany [name]  is a 63 y.o.  female  with an extensive past medical history including schizophrenia, hypertension, CHF, diabetes, stroke, and end-stage renal disease on hemodialysis. [name] currently admitted at inpatient behavioral health for Schizoaffective disorder [facility]) [F25.9]  Patient is known to our practice from previous admission.  States she has missed 5 days of dialysis outpatient.  Did not feel like going.  Patient seen and evaluated during.   Update:  Patient seen while in dialysis suite, waiting to start treatment  Appears in good spirits  States she is discharging today.   Objective:  Vital signs in last 24 hours:  Temp:  [97.6 F (36.4 C)-97.9 F (36.6 C)] 97.6 F (36.4 C) (08/21 0805) Pulse Rate:  [70-78] 70 (08/21 0805) Resp:  [14] 14 (08/20 1953) BP: (129-133)/(84-89) 133/89 (08/21 0805) SpO2:  [96 %-100 %] 96 % (08/21 0805)  Weight change:  Filed Weights   09/03/23 1412 09/04/23 1235 09/04/23 1559  Weight: 87.5 kg 89.2 kg 86.7 kg    Intake/Output: No intake/output data recorded.   Intake/Output this shift:  No intake/output data recorded.  Physical Exam: General: NAD, seated in chair  Head: Normocephalic, atraumatic. Moist oral mucosal membranes  Eyes: Anicteric  Lungs:  Clear to auscultation, normal effort  Heart: Regular rate and rhythm  Abdomen:  Soft, nontender  Extremities: No peripheral edema.  Neurologic: Awake, alert, conversant  Skin: Warm,dry, no rash  Access: Right chest IJ PermCath    Basic Metabolic Panel: Recent Labs  Lab 09/02/23 1330 09/03/23 1805 09/04/23 1500 09/06/23 1101  NA 132* 130* 133* 130*  K 3.4* 3.0* 3.2* 3.2*  CL 96* 93* 96* 93*  CO2 20* 25 26 23   GLUCOSE 106* 106* 118* 82  BUN 79* 43* 55* 45*  CREATININE 4.25* 2.72* 3.14* 3.41*  CALCIUM  9.6 9.1 9.6 9.5  PHOS  --  3.1 5.0* 4.8*    Liver Function Tests: Recent Labs  Lab 09/02/23 1330  09/03/23 1805 09/04/23 1500 09/06/23 1101  AST 16  --   --   --   ALT 11  --   --   --   ALKPHOS 115  --   --   --   BILITOT 0.6  --   --   --   PROT 7.3  --   --   --   ALBUMIN  3.5 3.2* 3.3* 3.4*   No results for input(s): LIPASE, AMYLASE in the last 168 hours. No results for input(s): AMMONIA  in the last 168 hours.  CBC: Recent Labs  Lab 09/03/23 1307 09/04/23 1500 09/06/23 1101  WBC 6.2 5.9 6.4  NEUTROABS 4.4  --   --   HGB 7.6* 7.6* 8.6*  HCT 24.9* 24.4* 27.7*  MCV 92.6 92.8 92.0  PLT 339 337 303    Cardiac Enzymes: No results for input(s): CKTOTAL, CKMB, CKMBINDEX, TROPONINI in the last 168 hours.  BNP: Invalid input(s): POCBNP  CBG: No results for input(s): GLUCAP in the last 168 hours.  Microbiology: Results for orders placed or performed during the hospital encounter of 08/13/23  SARS Coronavirus 2 by RT PCR (hospital order, performed in [facility] hospital lab) *cepheid single result test* Anterior Nasal Swab     Status: None   Collection Time: 08/15/23 11:07 AM   Specimen: Anterior Nasal Swab  Result Value Ref Range Status   SARS Coronavirus 2 by RT PCR NEGATIVE NEGATIVE Final  Comment: Performed at [facility], [address], [place], [place] [address]   *Note: Due to a large number of results and/or encounters for the requested time period, some results have not been displayed. A complete set of results can be found in Results Review.    Coagulation Studies: No results for input(s): LABPROT, INR in the last 72 hours.  Urinalysis: No results for input(s): COLORURINE, LABSPEC, PHURINE, GLUCOSEU, HGBUR, BILIRUBINUR, KETONESUR, PROTEINUR, UROBILINOGEN, NITRITE, LEUKOCYTESUR in the last 72 hours.  Invalid input(s): APPERANCEUR    Imaging: No results found.   Medications:     amLODipine   5 mg Oral BID   atorvastatin   20 mg Oral Daily   calcitRIOL   0.5 mcg Oral Daily   carvedilol   12.5 mg  Oral BID   Chlorhexidine  Gluconate Cloth  6 each Topical Q0600   dicyclomine   10 mg Oral TID   docusate sodium   100 mg Oral Daily   epoetin  alfa-epbx (RETACRIT ) injection  10,000 Units Intravenous Q T,Th,Sat-1800   escitalopram   10 mg Oral Daily   ezetimibe   10 mg Oral Daily   ferrous sulfate   325 mg Oral Daily   folic acid   1 mg Oral Daily   gabapentin   100 mg Oral QHS   melatonin  10 mg Oral QHS   metolazone   2.5 mg Oral Q M,W,F   mirtazapine   15 mg Oral QHS   pantoprazole   40 mg Oral Daily   potassium chloride   20 mEq Oral BID   torsemide   40 mg Oral BID   albuterol , capsaicin , heparin , hydrOXYzine , loperamide , OLANZapine , OLANZapine  zydis, traZODone   Assessment/ Plan:  [name] is a 63 y.o.  female  with an extensive past medical history including schizophrenia, hypertension, CHF, diabetes, stroke, and end-stage renal disease on hemodialysis.   [facility]/TTS/Rt permcath Time 10:25am  End-stage renal disease on hemodialysis.  Scheduled for dialysis today. Will attempt UF 2L as patient pre-weight 89kg, EDW 82.0. Unable to tolerated moderate fluid pull due to cramping. Does reports appetite has improved, likely has gained weight as well.   2. Anemia of chronic kidney disease Lab Results  Component Value Date   HGB 8.6 (L) 09/06/2023    Hemoglobin 8.6.  Will receive Retacrit  10,000 units IV with dialysis.  3. Secondary Hyperparathyroidism: with outpatient labs: PTH 1310, phosphorus 5.9, calcium  8.4 on 07/26/2023.   Lab Results  Component Value Date   PTH 57 02/18/2020   CALCIUM  9.5 09/06/2023   CAION 1.03 (L) 06/14/2023   PHOS 4.8 (H) 09/06/2023   Bone minerals remain stable  4. Hypokalemia, has started on oral potassium chloride . Prescribed Torsemide  40mg  twice daily and Metolazone  2.5mg  on MWF.    LOS: 22 Agron Swiney 8/21/202512:48 PM

## 2023-09-06 NOTE — Progress Notes (Signed)
 Tour of Duty:  Tiffany KATHEE Flemings, RN, 09/06/23, Tour of Duty: 0700-1900  SI/HI/AVH: Denies  Self-Reported   Mood: Positive  Anxiety: Denies Depression: Denies Irritability: Denies  Broset  Violence Prevention Guidelines *See Row Information*: Small Violence Risk interventions implemented   LBM  Last BM Date : 09/03/23   Pain: not present  Patient Refusals (including Rx): No  Shift Summary:    Last Vitals  Vitals Weight: 89.3 kg Temp: 98.5 F (36.9 C) Temp Source: Oral Pulse Rate: 80 Resp: (!) 23 BP: (!) 160/95 Patient Position: (not recorded)  Admission Type  Psych Admission Type (Psych Patients Only) Admission Status: Voluntary Date 72 hour document signed : (not recorded) Time 72 hour document signed : (not recorded) Provider Notified (First and Last Name) (see details for LINK to note): (not recorded)   Psychosocial Assessment  Psychosocial Assessment Patient Complaints: None Eye Contact: Fair Facial Expression: Animated Affect: Appropriate to circumstance Speech: Logical/coherent Interaction: Assertive Motor Activity: Unsteady Appearance/Hygiene: In scrubs Behavior Characteristics: Cooperative Mood: Pleasant   Aggressive Behavior  Targets: (not recorded)   Thought Process  Thought Process Coherency: Within Defined Limits Content: Within Defined Limits Delusions: None reported or observed Perception: Within Defined Limits Hallucination: None reported or observed Judgment: Impaired Confusion: Mild  Danger to Self/Others  Danger to Self Current suicidal ideation?: Denies Description of Suicide Plan: (not recorded) Self-Injurious Behavior: (not recorded) Agreement Not to Harm Self: (not recorded) Description of Agreement: (not recorded) Danger to Others: None reported or observed

## 2023-09-06 NOTE — Progress Notes (Signed)
   09/05/23 2100  Psych Admission Type (Psych Patients Only)  Admission Status Voluntary  Psychosocial Assessment  Patient Complaints None  Eye Contact Fair  Facial Expression Animated  Affect Appropriate to circumstance  Speech Logical/coherent  Interaction Assertive  Motor Activity Unsteady (wheelchair)  Appearance/Hygiene In scrubs  Behavior Characteristics Cooperative;Appropriate to situation  Mood Pleasant  Thought Process  Coherency WDL  Content WDL  Delusions None reported or observed  Perception WDL  Hallucination None reported or observed  Judgment Limited  Confusion Mild  Danger to Self  Current suicidal ideation? Denies  Danger to Others  Danger to Others None reported or observed

## 2023-09-06 NOTE — Group Note (Signed)
 Date:  09/06/2023 Time:  10:35 AM  Group Topic/Focus:  Movement Therapy    Participation Level:  Did Not Attend    Tiffany [name] 09/06/2023, 10:35 AM

## 2023-09-06 NOTE — BHH Counselor (Incomplete)
 [name] spoke with pt regarding her     [name] contacted [employer] to schedule ride

## 2023-09-06 NOTE — Progress Notes (Signed)
 Per [name] NP, pt will dc with portacath

## 2023-09-06 NOTE — Plan of Care (Signed)
  Problem: Education: Goal: Will be free of psychotic symptoms Outcome: Progressing

## 2023-09-06 NOTE — Progress Notes (Signed)
 [facility] MD Progress Note  09/06/2023 9:46 PM Tiffany [name]  MRN:  [id number]  ill M Northway is a 63 y.o. female admitted: Presented to the [facility] 08/13/2023  7:48 PM for after presenting due to a fall later patient exhibit extreme paranoia. She carries the psychiatric diagnoses of PTSD, MDD with and without psychotic features, schizoaffective disorder, and history of polysubstance use and has a past medical history of hypertension, ESRD with HD, congestive heart failure, diabetes, COPD, , stroke. Her current presentation of paranoia, delusional thoughts, auditory and visual hallucinations is most consistent with schizoaffective disorder. She meets criteria for inpatient psychiatric admission based on current symptom Patient is admitted to [facility] unit with Q15 min safety monitoring. Multidisciplinary team approach is offered. Medication management; group/milieu therapy is offered.    Subjective: Chart is reviewed and discussed the case with treatment team.  Today on interview patient is noted to be in a good mood.  She denies any SI/HI/plan and denies hallucinations.  Patient was interviewed by the provider and the social worker together.  Patient showed the team all the printouts she had including the hotel she will be booking in [place] .  She reports that her plan is to be discharged to a shelter in [place] and from there she will go to bank to withdraw her money and buy a ticket to [employer] .  Once she reaches the hotel she is going to go to the local hospital on Saturday to get her dialysis and she had the address and phone number for the hospital.  Patient is well aware of the risk benefits of her plan to move to [place]  and to establish her medical and psychiatric care there.  Patient informs that she will be utilizing the emergency room to get her dialysis going and from there to get referrals and establish her care.  She also informed the team that she can  ask her current nephrology team to connect her to the outpatient nephrology team in [place].  Provider has reached out to patient's current nephrology team and they agreed to work on finding her nephrology clinic in [place]  . Sleep: Fair  Appetite:  Fair  Past Psychiatric History: see h&P Family History:  Family History  Problem Relation Age of Onset   Cancer Mother        lung   Depression Mother    Cancer Father        prostate   Depression Sister    Anxiety disorder Sister    Schizophrenia Sister    Bipolar disorder Sister    Depression Sister    Depression Brother    Heart failure Other        cousin   Social History:  Social History   Substance and Sexual Activity  Alcohol Use No   Alcohol/week: 0.0 standard drinks of alcohol     Social History   Substance and Sexual Activity  Drug Use Not Currently   Types: Cocaine   Comment: 04/10/2016 last used cocaine back in November 2017    Social History   Socioeconomic History   Marital status: Widowed    Spouse name: Not on file   Number of children: 3   Years of education: 12th   Highest education level: 12th grade  Occupational History   Occupation: disabled    Comment: factory production  Tobacco Use   Smoking status: Former    Current packs/day: 0.00    Average packs/day: 1.5 packs/day for  39.1 years (58.6 ttl pk-yrs)    Types: Cigarettes    Start date: 03/13/1977    Quit date: 04/10/2016    Years since quitting: 7.4    Passive exposure: Past   Smokeless tobacco: Never  Vaping Use   Vaping status: Never Used  Substance and Sexual Activity   Alcohol use: No    Alcohol/week: 0.0 standard drinks of alcohol   Drug use: Not Currently    Types: Cocaine    Comment: 04/10/2016 last used cocaine back in November 2017   Sexual activity: Not Currently    Birth control/protection: Surgical  Other Topics Concern   Not on file  Social History Narrative   Has 1 son, [name]   Lives with  son and his boyfriend   Her house has ramps and handrails should she ever needs them.    Her mother lives down the street from her and is a good support person in addition to her son.   She drives herself, has private transportation.    Cocaine free since 02/24/16, smoke free since 04/10/16   Dialysis M-W-F   Social Drivers of Health   Financial Resource Strain: Low Risk  (07/11/2022)   Overall Financial Resource Strain (CARDIA)    Difficulty of Paying Living Expenses: Not very hard  Food Insecurity: Food Insecurity Present (08/15/2023)   Hunger Vital Sign    Worried About Running Out of Food in the Last Year: Sometimes true    Ran Out of Food in the Last Year: Sometimes true  Transportation Needs: No Transportation Needs (08/15/2023)   PRAPARE - Administrator, Civil Service (Medical): No    Lack of Transportation (Non-Medical): No  Physical Activity: Inactive (07/11/2022)   Exercise Vital Sign    Days of Exercise per Week: 0 days    Minutes of Exercise per Session: 0 min  Stress: Stress Concern Present (07/11/2022)   [employer] of Occupational Health - Occupational Stress Questionnaire    Feeling of Stress : To some extent  Social Connections: Socially Isolated (06/28/2023)   Social Connection and Isolation Panel    Frequency of Communication with Friends and Family: Three times a week    Frequency of Social Gatherings with Friends and Family: Three times a week    Attends Religious Services: Never    Active Member of Clubs or Organizations: No    Attends Banker Meetings: Never    Marital Status: Widowed   Past Medical History:  Past Medical History:  Diagnosis Date   Abdominal pain    Acute encephalopathy 11/21/2017   Acute exacerbation of CHF (congestive heart failure) (HCC) 03/03/2022   Acute GI bleeding 03/29/2021   Acute metabolic encephalopathy 02/20/2020   Aggressive behavior    Agitation 11/22/2017   Anoxic brain injury (HCC) 09/08/2016    C. Arrest due to respiratory failure and COPD exacerbation   Anxiety    Arthritis    all over (04/10/2016)   Asthma 10/18/2010   Binge eating disorder    Blood loss anemia 04/24/2011   CAP (community acquired pneumonia) 06/22/2015   Cardiac arrest (HCC) 09/08/2016   PEA   Carotid artery stenosis    1-39% bilateral by dopplers 11/2016   Chronic diastolic (congestive) heart failure (HCC)    Chronic pain syndrome 06/18/2012   Chronic post-traumatic stress disorder (PTSD) 05/27/2018   Chronic respiratory failure with hypoxia and hypercapnia (HCC) 06/22/2015   TRILOGY Vent >AVAPA-ES., Vt target 200-400, Max P 30 , PS max  20 , PS min 6-10 , E Max 6, E Min 4, Rate Auto AVAPS Rate 2 (titrate for pt comfort) , bleed O2 at 5l/m continuous flow .    Closed displaced fracture of fifth metacarpal bone 03/21/2018   Cocaine use disorder, severe, in sustained remission (HCC) 12/17/2015   Complication of anesthesia    decreased bp, decreased heart rate   COPD (chronic obstructive pulmonary disease) (HCC) 07/08/2014   Delusional disorder, persecutory type (HCC) 06/26/2021   Depression    Diabetic neuropathy (HCC) 04/24/2011   Difficulty with speech 01/24/2018   Disorder of nervous system    Drug abuse (HCC) 11/21/2017   Dyslipidemia 04/24/2011   E. coli UTI 02/20/2020   Elevated troponin 04/28/2012   Emphysema    Encephalopathy 11/21/2017   Essential hypertension 03/22/2016   Facet arthropathy, lumbar 06/12/2022   Fibula fracture 07/10/2016   Frequent falls 10/11/2017   GERD (gastroesophageal reflux disease)    GI bleed 03/30/2021   Gout 04/11/2017   Heart attack (HCC) 1980s   Hematochezia    History of blood transfusion 1994   couldn't stop bleeding from my period   History of drug abuse in remission (HCC) 11/28/2015   Quit in 2017   Hyperlipidemia 03/31/2021   Hyperlipidemia LDL goal <70    Hypocalcemia    Hypokalemia    Hypomagnesemia    Incontinence    Manic depression  (HCC)    Morbid obesity (HCC) 10/18/2010   Obstructive sleep apnea 10/18/2010   On home oxygen  therapy    6L; 24/7 (04/10/2016)   OSA on CPAP    wear mask sometimes (04/10/2016)   Painless rectal bleeding 12/31/2015   Paranoid (HCC)    sometimes; I'm on RX for it (04/10/2016)   Periumbilical abdominal pain    Prolonged Q-T interval on ECG    Rectal bleeding 12/31/2015   Recurrent syncope 07/09/2021   Rhabdomyolysis 06/16/2021   Schizoaffective disorder, bipolar type (HCC) 04/05/2018   Seasonal allergies    Seborrheic keratoses 12/31/2013   Seizures (HCC)    don't know what kind; last one was ~ 1 yr ago (04/10/2016)   Sinus bradycardia    Skin ulcer of sacrum, limited to breakdown of skin (HCC) 03/08/2022   Spondylolisthesis at L3-L4 level 06/12/2022   Stroke (HCC) 1980s   denies residual on 04/10/2016   Thrush 09/19/2013   Type 2 diabetes mellitus (HCC) 10/18/2010    Past Surgical History:  Procedure Laterality Date   A/V FISTULAGRAM N/A 02/01/2023   Procedure: A/V Fistulagram;  Surgeon: Norine Manuelita LABOR, MD;  Location: [facility] INVASIVE CV LAB;  Service: Cardiovascular;  Laterality: N/A;   AV FISTULA PLACEMENT Left 12/21/2022   Procedure: INSERTION OF LEFT ARM ARTERIOVENOUS (AV) GORE-TEX GRAFT;  Surgeon: Magda Debby SAILOR, MD;  Location: [facility] OR;  Service: Vascular;  Laterality: Left;   CESAREAN SECTION  1997   COLONOSCOPY WITH PROPOFOL  N/A 04/01/2021   Procedure: COLONOSCOPY WITH PROPOFOL ;  Surgeon: Rollin Dover, MD;  Location: [facility] ENDOSCOPY;  Service: Gastroenterology;  Laterality: N/A;  Rectal bleeding with drop in hemoglobin to 7.2 g/dL   HERNIA REPAIR     IR CHOLANGIOGRAM EXISTING TUBE  07/20/2016   IR PERC CHOLECYSTOSTOMY  05/10/2016   IR RADIOLOGIST EVAL & MGMT  06/08/2016   IR RADIOLOGIST EVAL & MGMT  06/29/2016   IR SINUS/FIST TUBE CHK-NON GI  07/12/2016   RIGHT/LEFT HEART CATH AND CORONARY ANGIOGRAPHY N/A 06/19/2017   Procedure: RIGHT/LEFT HEART CATH AND CORONARY  ANGIOGRAPHY;  Surgeon:  Bensimhon, Toribio SAUNDERS, MD;  Location: [facility] INVASIVE CV LAB;  Service: Cardiovascular;  Laterality: N/A;   TIBIA IM NAIL INSERTION Right 07/12/2016   Procedure: INTRAMEDULLARY (IM) NAIL RIGHT TIBIA;  Surgeon: Jerri Kay HERO, MD;  Location: [facility] OR;  Service: Orthopedics;  Laterality: Right;   UMBILICAL HERNIA REPAIR  ~ 1963   that's why I don't have a belly button   VAGINAL HYSTERECTOMY      Current Medications: Current Facility-Administered Medications  Medication Dose Route Frequency Provider Last Rate Last Admin   albuterol  (VENTOLIN  HFA) 108 (90 Base) MCG/ACT inhaler 2 puff  2 puff Inhalation Q6H PRN Mannie Jerel PARAS, NP   2 puff at 09/05/23 2205   amLODipine  (NORVASC ) tablet 5 mg  5 mg Oral BID Mannie Jerel PARAS, NP   5 mg at 09/05/23 2205   atorvastatin  (LIPITOR ) tablet 20 mg  20 mg Oral Daily Mannie Jerel PARAS, NP   20 mg at 09/06/23 9095   calcitRIOL  (ROCALTROL ) capsule 0.5 mcg  0.5 mcg Oral Daily Blanche Scovell, MD   0.5 mcg at 09/06/23 0907   capsaicin  (ZOSTRIX) 0.025 % cream   Topical BID PRN Onuoha, Chinwendu V, NP   Given at 09/06/23 9081   carvedilol  (COREG ) tablet 12.5 mg  12.5 mg Oral BID Mannie Jerel PARAS, NP   12.5 mg at 09/05/23 2205   Chlorhexidine  Gluconate Cloth 2 % PADS 6 each  6 each Topical Q0600 Druscilla Bald, NP   6 each at 09/01/23 9346   dicyclomine  (BENTYL ) capsule 10 mg  10 mg Oral TID Mannie Jerel PARAS, NP   10 mg at 09/06/23 2145   docusate sodium  (COLACE) capsule 100 mg  100 mg Oral Daily Willet Schleifer, MD   100 mg at 09/04/23 1741   epoetin  alfa-epbx (RETACRIT ) injection 10,000 Units  10,000 Units Intravenous Q T,Th,Sat-1800 Breeze, Bald, NP   10,000 Units at 09/06/23 1627   escitalopram  (LEXAPRO ) tablet 10 mg  10 mg Oral Daily Cree Napoli, MD   10 mg at 09/06/23 9095   ezetimibe  (ZETIA ) tablet 10 mg  10 mg Oral Daily Bernetha Anschutz, MD   10 mg at 09/06/23 9091   ferrous sulfate  tablet 325 mg  325 mg Oral Daily Elaiza Shoberg,  MD   325 mg at 09/06/23 9095   folic acid  (FOLVITE ) tablet 1 mg  1 mg Oral Daily Jafari Mckillop, MD   1 mg at 09/06/23 9095   gabapentin  (NEURONTIN ) capsule 100 mg  100 mg Oral QHS Mannie Jerel PARAS, NP   100 mg at 09/05/23 2205   heparin  injection 2,200 Units  25 Units/kg Dialysis PRN Druscilla Bald, NP       hydrOXYzine  (ATARAX ) tablet 25 mg  25 mg Oral Q6H PRN Lorena Clearman, MD   25 mg at 09/05/23 2216   loperamide  (IMODIUM ) capsule 4 mg  4 mg Oral PRN Taji Sather, MD   4 mg at 09/05/23 1726   melatonin tablet 10 mg  10 mg Oral QHS Madaram, Kondal R, MD   10 mg at 09/06/23 2145   metolazone  (ZAROXOLYN ) tablet 2.5 mg  2.5 mg Oral Q M,W,F Rosangelica Pevehouse, MD   2.5 mg at 09/05/23 1118   mirtazapine  (REMERON ) tablet 15 mg  15 mg Oral QHS Makailyn Mccormick, MD   15 mg at 09/06/23 2145   OLANZapine  (ZYPREXA ) injection 5 mg  5 mg Intramuscular TID PRN Mannie Jerel PARAS, NP       OLANZapine  zydis (ZYPREXA ) disintegrating tablet 5 mg  5 mg Oral TID PRN Mannie Jerel PARAS, NP   5 mg at 08/18/23 2318   pantoprazole  (PROTONIX ) EC tablet 40 mg  40 mg Oral Daily Eivan Gallina, MD   40 mg at 09/06/23 9095   potassium chloride  (KLOR-CON  M) CR tablet 20 mEq  20 mEq Oral BID Druscilla Bald, NP   20 mEq at 09/06/23 2146   torsemide  (DEMADEX ) tablet 40 mg  40 mg Oral BID Mannie Jerel PARAS, NP   40 mg at 09/06/23 1705   traZODone  (DESYREL ) tablet 50 mg  50 mg Oral QHS PRN Ruther Millie SAUNDERS, MD        Lab Results:  Results for orders placed or performed during the hospital encounter of 08/15/23 (from the past 48 hours)  CBC     Status: Abnormal   Collection Time: 09/06/23 11:01 AM  Result Value Ref Range   WBC 6.4 4.0 - 10.5 K/uL   RBC 3.01 (L) 3.87 - 5.11 MIL/uL   Hemoglobin 8.6 (L) 12.0 - 15.0 g/dL   HCT 72.2 (L) 63.9 - 53.9 %   MCV 92.0 80.0 - 100.0 fL   MCH 28.6 26.0 - 34.0 pg   MCHC 31.0 30.0 - 36.0 g/dL   RDW 83.0 (H) 88.4 - 84.4 %   Platelets 303 150 - 400 K/uL   nRBC 0.0 0.0 - 0.2 %     Comment: Performed at [facility], [address] [place], [place] [address]  Renal function panel     Status: Abnormal   Collection Time: 09/06/23 11:01 AM  Result Value Ref Range   Sodium 130 (L) 135 - 145 mmol/L   Potassium 3.2 (L) 3.5 - 5.1 mmol/L   Chloride 93 (L) 98 - 111 mmol/L   CO2 23 22 - 32 mmol/L   Glucose, Bld 82 70 - 99 mg/dL    Comment: Glucose reference range applies only to samples taken after fasting for at least 8 hours.   BUN 45 (H) 8 - 23 mg/dL   Creatinine, Ser 6.58 (H) 0.44 - 1.00 mg/dL   Calcium  9.5 8.9 - 10.3 mg/dL   Phosphorus 4.8 (H) 2.5 - 4.6 mg/dL   Albumin  3.4 (L) 3.5 - 5.0 g/dL   GFR, Estimated 14 (L) >60 mL/min    Comment: (NOTE) Calculated using the CKD-EPI Creatinine Equation (2021)    Anion gap 14 5 - 15    Comment: Performed at [facility], [address], [place], [place] [address]   *Note: Due to a large number of results and/or encounters for the requested time period, some results have not been displayed. A complete set of results can be found in Results Review.      Blood Alcohol level:  Lab Results  Component Value Date   [facility] <15 05/31/2023   ETH <10 03/10/2023    Metabolic Disorder Labs: Lab Results  Component Value Date   HGBA1C 5.0 01/05/2022   MPG 99.67 06/17/2021   MPG 105.41 07/23/2020   No results found for: PROLACTIN Lab Results  Component Value Date   CHOL 151 11/14/2022   TRIG 100 11/14/2022   HDL 65 11/14/2022   CHOLHDL 2.3 11/14/2022   VLDL 20 11/14/2022   LDLCALC 66 11/14/2022   LDLCALC 54 08/05/2018    Physical Findings: AIMS:  , ,  ,  ,    CIWA:    COWS:      Psychiatric Specialty Exam:  Presentation  General Appearance:  Appropriate for Environment; Casual  Eye Contact: Fair  Speech: Clear and Coherent  Speech Volume: Normal    Mood and Affect  Mood: Normal Affect: Euthymic  Thought Process  Thought Processes: Coherent But improving Descriptions of  Associations:Intact  Orientation:Full (Time, Place and Person)  Thought Content:Logical  Hallucinations: Auditory hallucinations  Ideas of Reference:None  Suicidal Thoughts: Denies  Homicidal Thoughts: Denies   Sensorium  Memory: Immediate Fair; Recent Fair; Remote Fair  Judgment: Fair  Insight: Fair        Physical Exam: Physical Exam Vitals and nursing note reviewed.    ROS Blood pressure 113/72, pulse 83, temperature (!) 97.4 F (36.3 C), resp. rate 18, weight 87.5 kg, SpO2 (!) 71%. Body mass index is 35.28 kg/m.  Diagnosis: Principal Problem:   Schizoaffective disorder [facility])   Clinical Decision Making: Patient with history of Schizoaffective disorder Bipolar type, end-stage renal disease admitted for worsening paranoia and delusions about people abusing her, out to get her.  Her paranoia worsened to the point where she refused to leave the house and skipped her dialysis for 5 days which led up to medical emergency.  Patient needs inpatient psychiatric hospitalization for further stabilization.   Treatment Plan Summary:   Safety and Monitoring:             -- Voluntary admission to inpatient psychiatric unit for safety, stabilization and treatment             -- Daily contact with patient to assess and evaluate symptoms and progress in treatment             -- Patient's case to be discussed in multi-disciplinary team meeting             -- Observation Level: q15 minute checks             -- Vital signs:  q12 hours             -- Precautions: suicide, elopement, and assault   2. Psychiatric Diagnoses and Treatment:               Discontinued Zyprexa .    Invega  LAI 08/26/2023 Invega  9 mg daily-discontinued after the LAI to minimize daytime sedation. Lexapro  10 mg daily Prazosin    Patient has chronic delusions but exacerbated to the extent of not going to dialysis-   Will consider adding saphris  which is her home meds   -- The  risks/benefits/side-effects/alternatives to this medication were discussed in detail with the patient and time was given for questions. The patient consents to medication trial.                -- Metabolic profile and EKG monitoring obtained while on an atypical antipsychotic (BMI: Lipid Panel: HbgA1c: QTc:)              -- Encouraged patient to participate in unit milieu and in scheduled group therapies                            3. Medical Issues Being Addressed:   ESRD: consulted nephrology for dialysis 4. Discharge Planning:   -- Social work and case management to assist with discharge planning and identification of hospital follow-up needs prior to discharge  -- Estimated LOS: 3-4 days  Allyn Foil, MD 09/06/2023, 9:46 PM

## 2023-09-06 NOTE — Group Note (Signed)
 LCSW Group Therapy Note  Group Date: 09/06/2023 Start Time: 1330 End Time: 1400   Type of Therapy and Topic:  Group Therapy - Healthy vs Unhealthy Coping Skills  Participation Level:  Did Not Attend   Description of Group The focus of this group was to determine what unhealthy coping techniques typically are used by group members and what healthy coping techniques would be helpful in coping with various problems. Patients were guided in becoming aware of the differences between healthy and unhealthy coping techniques. Patients were asked to identify 2-3 healthy coping skills they would like to learn to use more effectively.  Therapeutic Goals Patients learned that coping is what human beings do all day long to deal with various situations in their lives Patients defined and discussed healthy vs unhealthy coping techniques Patients identified their preferred coping techniques and identified whether these were healthy or unhealthy Patients determined 2-3 healthy coping skills they would like to become more familiar with and use more often. Patients provided support and ideas to each other   Summary of Patient Progress:  X   Therapeutic Modalities Cognitive Behavioral Therapy Motivational Interviewing  Lum JONETTA Croft, [place] 09/06/2023  1:43 PM

## 2023-09-06 NOTE — Group Note (Signed)
 Recreation Therapy Group Note   Group Topic:Healthy Support Systems  Group Date: 09/06/2023 Start Time: 1500 End Time: 1530 Facilitators: Celestia Jeoffrey BRAVO, LRT, CTRS Location: Dayroom  Group Description: Straw Bridge. In groups, patients were given 10 plastic drinking straws and an equal length of masking tape. Using the materials provided, patients were instructed to build a free-standing bridge-like structure to suspend an everyday item (ex: puzzle box) off the floor or table surface. All materials were required to be used in Secondary school teacher. LRT facilitated post-activity discussion reviewing how we, humans, are like the structure we built; when things get too heavy in our life and we do not have adequate supports/coping skills, then we will fall just like the straw-built structure will. LRT focused on how having a "base" or structure on the bottom was necessary for the object to stand, meaning we must be secure and stable first before building on ourselves or others. Patients were encouraged to name 2 healthy supports in their life and reflect on how the skills used in this activity can be generalized to daily life post discharge.  Goal Area(s) Addressed:  Patient will identify two healthy support systems in their life. Patient will work on Product manager. Patient will verbalize the importance of having a strong and steady "base".  Patient will follow multi-step directions. Patients will engage in creativity and use all provided materials.   Affect/Mood: N/A   Participation Level: Did not attend    Clinical Observations/Individualized Feedback: Patient did not attend group.   Plan: Continue to engage patient in RT group sessions 2-3x/week.   [address] LRT, CTRS 09/06/2023 5:15 PM

## 2023-09-06 NOTE — Progress Notes (Signed)
  Received patient in bed to unit.   Informed consent signed and in chart.    TX duration: 3.25     Transported back to floor Hand-off given to patient's nurse. No c/o and no acute distress noted   Access used: R HD Catheter Access issues: none   Total UF removed: 2.0L Medication(s) given: retacrit  Post HD VS: wnl Post HD weight: 87.5kg     Olivia Hurst LPN [facility]

## 2023-09-06 NOTE — Plan of Care (Signed)
   Problem: Activity: Goal: Will verbalize the importance of balancing activity with adequate rest periods Outcome: Progressing   Problem: Education: Goal: Will be free of psychotic symptoms Outcome: Progressing Goal: Knowledge of the prescribed therapeutic regimen will improve Outcome: Progressing   Problem: Coping: Goal: Coping ability will improve Outcome: Progressing Goal: Will verbalize feelings Outcome: Progressing   Problem: Health Behavior/Discharge Planning: Goal: Compliance with prescribed medication regimen will improve Outcome: Progressing   Problem: Nutritional: Goal: Ability to achieve adequate nutritional intake will improve Outcome: Progressing   Problem: Role Relationship: Goal: Ability to communicate needs accurately will improve Outcome: Progressing Goal: Ability to interact with others will improve Outcome: Progressing   Problem: Safety: Goal: Ability to redirect hostility and anger into socially appropriate behaviors will improve Outcome: Progressing Goal: Ability to remain free from injury will improve Outcome: Progressing   Problem: Self-Care: Goal: Ability to participate in self-care as condition permits will improve Outcome: Progressing   Problem: Self-Concept: Goal: Will verbalize positive feelings about self Outcome: Progressing   Problem: Education: Goal: Utilization of techniques to improve thought processes will improve Outcome: Progressing Goal: Knowledge of the prescribed therapeutic regimen will improve Outcome: Progressing

## 2023-09-06 NOTE — Group Note (Signed)
 Recreation Therapy Group Note   Group Topic:General Recreation  Group Date: 09/06/2023 Start Time: 1100 End Time: 1135 Facilitators: Celestia Jeoffrey BRAVO, LRT, CTRS Location: Courtyard  Group Description: Outdoor Recreation. Patients had the option to play corn hole, ring toss, bowling or listening to music while outside in the courtyard getting fresh air and sunlight. Patients helped water and prune the raised garden beds. LRT and patients discussed things that they enjoy doing in their free time outside of the hospital. LRT encouraged patients to drink water after being active and getting their heart rate up.   Goal Area(s) Addressed: Patient will identify leisure interests.  Patient will practice healthy decision making. Patient will engage in recreation activity.   Affect/Mood: N/A   Participation Level: Did not attend    Clinical Observations/Individualized Feedback: Patient did not attend group.   Plan: Continue to engage patient in RT group sessions 2-3x/week.   Jeoffrey BRAVO Celestia, LRT, CTRS 09/06/2023 2:29 PM

## 2023-09-07 NOTE — Progress Notes (Signed)
  [facility] Adult Case Management Discharge Plan :  Will you be returning to the same living situation after discharge:  No. Pt going to the [facility] At discharge, do you have transportation home?: Yes,  [name] will assist with transportation  Do you have the ability to pay for your medications: Yes,  [employer] / [employer] PPO  Release of information consent forms completed and in the chart;  Patient's signature needed at discharge.  Patient to Follow up at:  Follow-up Information     [facility]. Go to.   Why: Although you declined walk-in hours. [facility]'s walk-in hours depend on the specific service and location; their [facility]) accepts walk-ins Monday-Thursday, 8 a.m.-10 p.m., and Friday, 8 a.m.-8 p.m. Contact information: [address]  [address] [place] [address] [phone]                 Next level of care provider has access to [facility] Link:no  Safety Planning and Suicide Prevention discussed: Yes,  [name] went over SPE with pt      Has patient been referred to the Quitline?: Patient refused referral for treatment  Patient has been referred for addiction treatment: No known substance use disorder.  [address] LCSWA 09/07/2023, 10:28 AM

## 2023-09-07 NOTE — Progress Notes (Signed)
  Talked to pt on yesterday at Dialysis Unit. Pt stated they will be moving to [place], [place].  Attempted to gather information on physical address or where pt would reside.  Pt stated she does not have a physical address, and will go to the nearest ED for any HD services.  Education was provided on the important's of her treatments and having a outpt clinic.  Navigator following to assist with any HD needs.  Suzen Satchel Dialysis Navigator [email or web address]
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
## 2023-09-07 NOTE — Progress Notes (Signed)
 Patient was discharged from [facility] unit at approx 1130 escorted by staff. Patient denies SI/HI/AVH. Discharge packet to include printed AVS, Suicide Risk Assessment, and Transition Record reviewed with patient(on previous shift). Belongings returned. Suicide safety plan was not completed.

## 2023-09-07 NOTE — Group Note (Signed)
 Date:  09/07/2023 Time:  10:52 AM  Group Topic/Focus:  Fresh air Therapy with music    Participation Level:  Did Not Attend  Norleen SHAUNNA Bias 09/07/2023, 10:52 AM

## 2023-09-10 ENCOUNTER — Emergency Department (HOSPITAL_COMMUNITY)
Admission: EM | Admit: 2023-09-10 | Discharge: 2023-09-11 | Disposition: A | Discharge status: Home / Self Care | Attending: Emergency Medicine | Admitting: Emergency Medicine

## 2023-09-10 ENCOUNTER — Other Ambulatory Visit: Payer: Self-pay

## 2023-09-10 ENCOUNTER — Encounter (HOSPITAL_COMMUNITY): Payer: Self-pay

## 2023-09-10 ENCOUNTER — Emergency Department (HOSPITAL_COMMUNITY)

## 2023-09-10 DIAGNOSIS — I5032 Chronic diastolic (congestive) heart failure: Secondary | ICD-10-CM | POA: Insufficient documentation

## 2023-09-10 DIAGNOSIS — Z79899 Other long term (current) drug therapy: Secondary | ICD-10-CM | POA: Insufficient documentation

## 2023-09-10 DIAGNOSIS — J4489 Other specified chronic obstructive pulmonary disease: Secondary | ICD-10-CM | POA: Insufficient documentation

## 2023-09-10 DIAGNOSIS — R0602 Shortness of breath: Secondary | ICD-10-CM | POA: Diagnosis present

## 2023-09-10 DIAGNOSIS — Z87891 Personal history of nicotine dependence: Secondary | ICD-10-CM | POA: Insufficient documentation

## 2023-09-10 DIAGNOSIS — Z992 Dependence on renal dialysis: Secondary | ICD-10-CM | POA: Diagnosis not present

## 2023-09-10 DIAGNOSIS — E1122 Type 2 diabetes mellitus with diabetic chronic kidney disease: Secondary | ICD-10-CM | POA: Diagnosis not present

## 2023-09-10 DIAGNOSIS — I2510 Atherosclerotic heart disease of native coronary artery without angina pectoris: Secondary | ICD-10-CM | POA: Insufficient documentation

## 2023-09-10 DIAGNOSIS — N186 End stage renal disease: Secondary | ICD-10-CM | POA: Insufficient documentation

## 2023-09-10 DIAGNOSIS — I132 Hypertensive heart and chronic kidney disease with heart failure and with stage 5 chronic kidney disease, or end stage renal disease: Secondary | ICD-10-CM | POA: Diagnosis not present

## 2023-09-10 LAB — BASIC METABOLIC PANEL WITH GFR
Anion gap: 12 (ref 5–15)
BUN: 48 mg/dL — ABNORMAL HIGH (ref 8–23)
CO2: 20 mmol/L — ABNORMAL LOW (ref 22–32)
Calcium: 9.3 mg/dL (ref 8.9–10.3)
Chloride: 106 mmol/L (ref 98–111)
Creatinine, Ser: 4.78 mg/dL — ABNORMAL HIGH (ref 0.44–1.00)
GFR, Estimated: 10 mL/min — ABNORMAL LOW (ref >60)
Glucose, Bld: 83 mg/dL (ref 70–99)
Potassium: 3.8 mmol/L (ref 3.5–5.1)
Sodium: 138 mmol/L (ref 135–145)

## 2023-09-10 LAB — CBC
HCT: 25.5 % — ABNORMAL LOW (ref 36.0–46.0)
Hemoglobin: 7.5 g/dL — ABNORMAL LOW (ref 12.0–15.0)
MCH: 29.1 pg (ref 26.0–34.0)
MCHC: 29.4 g/dL — ABNORMAL LOW (ref 30.0–36.0)
MCV: 98.8 fL (ref 80.0–100.0)
Platelets: 324 K/uL (ref 150–400)
RBC: 2.58 MIL/uL — ABNORMAL LOW (ref 3.87–5.11)
RDW: 18.6 % — ABNORMAL HIGH (ref 11.5–15.5)
WBC: 7.9 K/uL (ref 4.0–10.5)
nRBC: 0 % (ref 0.0–0.2)

## 2023-09-10 LAB — BRAIN NATRIURETIC PEPTIDE: B Natriuretic Peptide: 151.8 pg/mL — ABNORMAL HIGH (ref 0.0–100.0)

## 2023-09-10 LAB — HEPATITIS B SURFACE ANTIGEN: Hepatitis B Surface Ag: NONREACTIVE

## 2023-09-10 MED ORDER — ALTEPLASE 2 MG IJ SOLR: 2.0000 mg | Freq: Once | INTRAMUSCULAR | Status: DC | PRN

## 2023-09-10 MED ORDER — ONDANSETRON HCL 4 MG/2ML IJ SOLN
4.0000 mg | Freq: Once | INTRAMUSCULAR | Status: AC
  Administered 2023-09-10: 4 mg via INTRAVENOUS
  Filled 2023-09-10: qty 2

## 2023-09-10 MED ORDER — PENTAFLUOROPROP-TETRAFLUOROETH EX AERO: 1.0000 | INHALATION_SPRAY | CUTANEOUS | Status: DC | PRN

## 2023-09-10 MED ORDER — OXYCODONE-ACETAMINOPHEN 5-325 MG PO TABS
1.0000 | ORAL_TABLET | Freq: Once | ORAL | Status: AC
  Administered 2023-09-10: 1 via ORAL
  Filled 2023-09-10: qty 1

## 2023-09-10 MED ORDER — MORPHINE SULFATE (PF) 2 MG/ML IV SOLN
2.0000 mg | Freq: Once | INTRAVENOUS | Status: AC
Start: 2023-09-10 — End: 2023-09-10
  Administered 2023-09-10: 2 mg via INTRAVENOUS
  Filled 2023-09-10: qty 1

## 2023-09-10 MED ORDER — CHLORHEXIDINE GLUCONATE CLOTH 2 % EX PADS: 6.0000 | MEDICATED_PAD | Freq: Every day | CUTANEOUS | Status: DC

## 2023-09-10 MED ORDER — HEPARIN SODIUM (PORCINE) 1000 UNIT/ML DIALYSIS
1000.0000 [IU] | INTRAMUSCULAR | Status: DC | PRN
  Administered 2023-09-11: 3200 [IU]

## 2023-09-10 MED ORDER — LIDOCAINE-PRILOCAINE 2.5-2.5 % EX CREA: 1.0000 | TOPICAL_CREAM | CUTANEOUS | Status: DC | PRN

## 2023-09-10 MED ORDER — ANTICOAGULANT SODIUM CITRATE 4% (200MG/5ML) IV SOLN: 5.0000 mL | Status: DC | PRN

## 2023-09-10 MED ORDER — LIDOCAINE HCL (PF) 1 % IJ SOLN: 5.0000 mL | INTRAMUSCULAR | Status: DC | PRN

## 2023-09-10 NOTE — Consult Note (Signed)
 Reason for Consult:ESRD Referring Physician: Dr. Jayson Pereyra  Chief Complaint: Shortness of breath  [facility]  4hr 180NRe EDW 82kg 3/2 RIJ TC Calcitriol  0.5 mcg 3 times weekly No heparin   Assessment/Plan: ESRD -she has moved to [place]  and the ER there is working on trying to get her a outpatient spot; according to the patient she last received dialysis last Thursday or Friday. Plan on dialysis and discharged from the ED Anemia -she receives Retacrit  10,000 units with dialysis in [place] ; transfuse as needed Renal osteodystrophy -last PTH 1310; continue binders as tolerated. History of hypokalemia on torsemide  twice daily as well as metolazone  Monday Wednesday and Fridays as well as potassium replacement.  Could always trial a 4K bath. PTSD/MDD/schizoaffective disorder Polysubstance abuse Hypertension -restart home regimen CHF -decompensated because of missed dialysis COPD Diabetes    HPI: Tiffany [name] is an 63 y.o. female with a history of PTSD, MDD, schizoaffective disorder, polysubstance use, hypertension, CHF, diabetes, COPD, CVA, ESRD who recently moved to [place]  with Center being set up in regard to the patient and they are trying to set her up with a unit there.  Her last dialysis unit reasonable with at [facility] on 7/22.  According to the patient her last treatment was last Thursday or Friday.  [name] going to be here till 3 September.  [name] here because of shortness of breath but denies any chest pain, fever, chills, cough, sore throat, sinus issues.  In the ED hemoglobin was 7.5, BNP 152, potassium 3.8, bicarb 20, BUN/creatinine 48 and 4.78.  ROS Pertinent items are noted in HPI.  Chemistry and CBC: Creat  Date/Time Value Ref Range Status  02/18/2016 11:46 AM 1.22 (H) 0.50 - 1.05 mg/dL Final    Comment:      For patients > or = 64 years of age: The upper reference limit for Creatinine is approximately 13% higher for people identified as African-American.      12/16/2015 12:29 PM 1.50 (H) 0.50 - 1.05 mg/dL Final    Comment:      For patients > or = 64 years of age: The upper reference limit for Creatinine is approximately 13% higher for people identified as African-American.     08/24/2015 03:05 PM 1.06 (H) 0.50 - 1.05 mg/dL Final    Comment:      For patients > or = 64 years of age: The upper reference limit for Creatinine is approximately 13% higher for people identified as African-American.     01/20/2015 04:11 PM 1.13 (H) 0.50 - 1.05 mg/dL Final  88/78/7983 88:83 AM 0.98 0.50 - 1.05 mg/dL Final  89/95/7983 97:87 PM 1.40 (H) 0.50 - 1.05 mg/dL Final  94/94/7983 88:46 AM 1.08 0.50 - 1.10 mg/dL Final  87/83/7984 95:48 PM 1.01 0.50 - 1.10 mg/dL Final  93/69/7984 97:40 PM 0.79 0.50 - 1.10 mg/dL Final  90/84/7985 87:67 PM 0.99 0.50 - 1.10 mg/dL Final  95/88/7985 97:69 PM 0.89 0.50 - 1.10 mg/dL Final  96/78/7985 87:89 PM 1.57 (H) 0.50 - 1.10 mg/dL Final   Creatinine, Ser  Date/Time Value Ref Range Status  09/10/2023 02:54 PM 4.78 (H) 0.44 - 1.00 mg/dL Final  91/78/7974 88:98 AM 3.41 (H) 0.44 - 1.00 mg/dL Final  91/80/7974 96:99 PM 3.14 (H) 0.44 - 1.00 mg/dL Final  91/81/7974 93:94 PM 2.72 (H) 0.44 - 1.00 mg/dL Final  91/82/7974 98:69 PM 4.25 (H) 0.44 - 1.00 mg/dL Final  91/85/7974 92:99 AM 4.00 (H) 0.44 - 1.00 mg/dL Final  91/87/7974 89:85 AM  4.45 (H) 0.44 - 1.00 mg/dL Final  91/90/7974 88:86 AM 3.68 (H) 0.44 - 1.00 mg/dL Final  91/92/7974 90:52 AM 3.95 (H) 0.44 - 1.00 mg/dL Final  91/94/7974 91:83 AM 4.90 (H) 0.44 - 1.00 mg/dL Final  91/98/7974 97:69 PM 4.26 (H) 0.44 - 1.00 mg/dL Final  92/69/7974 88:98 AM 7.75 (H) 0.44 - 1.00 mg/dL Final  92/71/7974 89:90 PM 7.95 (H) 0.44 - 1.00 mg/dL Final  92/80/7974 94:74 AM 4.91 (H) 0.44 - 1.00 mg/dL Final  92/81/7974 95:80 AM 3.65 (H) 0.44 - 1.00 mg/dL Final  92/82/7974 94:72 AM 4.39 (H) 0.44 - 1.00 mg/dL Final  92/83/7974 95:44 AM 3.75 (H) 0.44 - 1.00 mg/dL Final  92/84/7974 97:98 PM 6.25  (H) 0.44 - 1.00 mg/dL Final  92/85/7974 95:96 AM 5.15 (H) 0.44 - 1.00 mg/dL Final  92/86/7974 89:67 AM 4.63 (H) 0.44 - 1.00 mg/dL Final  92/87/7974 92:55 AM 6.91 (H) 0.44 - 1.00 mg/dL Final  92/88/7974 88:49 PM 6.53 (H) 0.44 - 1.00 mg/dL Final  93/71/7974 93:68 AM 4.39 (H) 0.44 - 1.00 mg/dL Final  93/73/7974 95:74 AM 2.53 (H) 0.44 - 1.00 mg/dL Final  93/74/7974 89:82 AM 3.29 (H) 0.44 - 1.00 mg/dL Final  93/75/7974 92:82 AM 5.01 (H) 0.44 - 1.00 mg/dL Final  93/78/7974 91:79 AM 4.52 (H) 0.44 - 1.00 mg/dL Final  93/80/7974 94:69 AM 4.87 (H) 0.44 - 1.00 mg/dL Final  93/81/7974 94:76 AM 4.15 (H) 0.44 - 1.00 mg/dL Final  93/82/7974 93:98 AM 5.04 (H) 0.44 - 1.00 mg/dL Final  93/84/7974 91:83 AM 3.71 (H) 0.44 - 1.00 mg/dL Final  93/85/7974 94:99 AM 4.50 (H) 0.44 - 1.00 mg/dL Final  93/87/7974 92:81 AM 5.91 (H) 0.44 - 1.00 mg/dL Final  93/88/7974 90:96 PM 6.06 (H) 0.44 - 1.00 mg/dL Final  93/88/7974 95:70 PM 6.09 (H) 0.44 - 1.00 mg/dL Final  93/94/7974 97:76 PM 6.51 (H) 0.44 - 1.00 mg/dL Final  94/69/7974 96:85 PM 4.66 (H) 0.44 - 1.00 mg/dL Final  94/70/7974 87:81 AM 8.00 (H) 0.44 - 1.00 mg/dL Final  94/71/7974 88:52 PM 8.01 (H) 0.44 - 1.00 mg/dL Final  94/80/7974 90:99 AM 5.83 (H) 0.44 - 1.00 mg/dL Final  94/84/7974 91:49 PM 5.43 (H) 0.44 - 1.00 mg/dL Final  97/73/7974 91:69 AM 4.58 (H) 0.44 - 1.00 mg/dL Final  97/75/7974 90:99 AM 5.07 (H) 0.44 - 1.00 mg/dL Final    Comment:    RESULTS VERIFIED BY REPEAT TESTING DELTA CHECK NOTED   03/10/2023 04:50 AM 3.15 (H) 0.44 - 1.00 mg/dL Final  97/95/7974 95:43 AM 3.03 (H) 0.44 - 1.00 mg/dL Final  97/96/7974 95:55 AM 4.16 (H) 0.44 - 1.00 mg/dL Final  97/97/7974 94:73 AM 3.59 (H) 0.44 - 1.00 mg/dL Final    Comment:    DELTA CHECK NOTED  02/17/2023 06:14 AM 2.26 (H) 0.44 - 1.00 mg/dL Final  97/98/7974 97:99 AM 4.01 (H) 0.44 - 1.00 mg/dL Final  98/69/7974 94:82 AM 3.03 (H) 0.44 - 1.00 mg/dL Final  98/70/7974 91:71 AM 4.19 (H) 0.44 - 1.00 mg/dL  Final  98/71/7974 92:42 PM 3.84 (H) 0.44 - 1.00 mg/dL Final   Recent Labs  Lab 09/03/23 1805 09/04/23 1500 09/06/23 1101 09/10/23 1454  NA 130* 133* 130* 138  K 3.0* 3.2* 3.2* 3.8  CL 93* 96* 93* 106  CO2 25 26 23  20*  GLUCOSE 106* 118* 82 83  BUN 43* 55* 45* 48*  CREATININE 2.72* 3.14* 3.41* 4.78*  CALCIUM  9.1 9.6 9.5 9.3  PHOS 3.1 5.0* 4.8*  --  Recent Labs  Lab 09/04/23 1500 09/06/23 1101 09/10/23 1454  WBC 5.9 6.4 7.9  HGB 7.6* 8.6* 7.5*  HCT 24.4* 27.7* 25.5*  MCV 92.8 92.0 98.8  PLT 337 303 324   Liver Function Tests: Recent Labs  Lab 09/03/23 1805 09/04/23 1500 09/06/23 1101  ALBUMIN  3.2* 3.3* 3.4*   No results for input(s): LIPASE, AMYLASE in the last 168 hours. No results for input(s): AMMONIA  in the last 168 hours. Cardiac Enzymes: No results for input(s): CKTOTAL, CKMB, CKMBINDEX, TROPONINI in the last 168 hours. Iron  Studies: No results for input(s): IRON , TIBC, TRANSFERRIN, FERRITIN in the last 72 hours. PT/INR: @LABRCNTIP (inr:5)  Xrays/Other Studies: ) Results for orders placed or performed during the hospital encounter of 09/10/23 (from the past 48 hours)  Basic metabolic panel     Status: Abnormal   Collection Time: 09/10/23  2:54 PM  Result Value Ref Range   Sodium 138 135 - 145 mmol/L   Potassium 3.8 3.5 - 5.1 mmol/L   Chloride 106 98 - 111 mmol/L   CO2 20 (L) 22 - 32 mmol/L   Glucose, Bld 83 70 - 99 mg/dL    Comment: Glucose reference range applies only to samples taken after fasting for at least 8 hours.   BUN 48 (H) 8 - 23 mg/dL   Creatinine, Ser 5.21 (H) 0.44 - 1.00 mg/dL   Calcium  9.3 8.9 - 10.3 mg/dL   GFR, Estimated 10 (L) >60 mL/min    Comment: (NOTE) Calculated using the CKD-EPI Creatinine Equation (2021)    Anion gap 12 5 - 15    Comment: Performed at [facility], [address] [place], [place] [address]  CBC     Status: Abnormal   Collection Time: 09/10/23  2:54 PM  Result Value Ref  Range   WBC 7.9 4.0 - 10.5 K/uL   RBC 2.58 (L) 3.87 - 5.11 MIL/uL   Hemoglobin 7.5 (L) 12.0 - 15.0 g/dL   HCT 74.4 (L) 63.9 - 53.9 %   MCV 98.8 80.0 - 100.0 fL   MCH 29.1 26.0 - 34.0 pg   MCHC 29.4 (L) 30.0 - 36.0 g/dL   RDW 81.3 (H) 88.4 - 84.4 %   Platelets 324 150 - 400 K/uL   nRBC 0.0 0.0 - 0.2 %    Comment: Performed at [facility], [address] [place], [place] [address]  BNP (Order if Patient has history of Heart Failure)     Status: Abnormal   Collection Time: 09/10/23  2:54 PM  Result Value Ref Range   B Natriuretic Peptide 151.8 (H) 0.0 - 100.0 pg/mL    Comment: Performed at [facility], [name], [place], [place] [address]   *Note: Due to a large number of results and/or encounters for the requested time period, some results have not been displayed. A complete set of results can be found in Results Review.   DG Chest 2 View Result Date: 09/10/2023 CLINICAL DATA:  Short of breath EXAM: CHEST - 2 VIEW COMPARISON:  07/27/2023 FINDINGS: Central venous line unchanged. Stable cardiac silhouette. No effusion, infiltrate, or pneumothorax. No acute osseous abnormality. IMPRESSION: No acute cardiopulmonary process. Electronically Signed   By: Jackquline Boxer M.D.   On: 09/10/2023 15:53    PMH:   Past Medical History:  Diagnosis Date   Abdominal pain    Acute encephalopathy 11/21/2017   Acute exacerbation of CHF (congestive heart failure) (HCC) 03/03/2022   Acute GI bleeding  03/29/2021   Acute metabolic encephalopathy 02/20/2020   Aggressive behavior    Agitation 11/22/2017   Anoxic brain injury (HCC) 09/08/2016   C. Arrest due to respiratory failure and COPD exacerbation   Anxiety    Arthritis    all over (04/10/2016)   Asthma 10/18/2010   Binge eating disorder    Blood loss anemia 04/24/2011   CAP (community acquired pneumonia) 06/22/2015   Cardiac arrest (HCC) 09/08/2016   PEA   Carotid artery stenosis    1-39% bilateral by dopplers 11/2016    Chronic diastolic (congestive) heart failure (HCC)    Chronic pain syndrome 06/18/2012   Chronic post-traumatic stress disorder (PTSD) 05/27/2018   Chronic respiratory failure with hypoxia and hypercapnia (HCC) 06/22/2015   TRILOGY Vent >AVAPA-ES., Vt target 200-400, Max P 30 , PS max 20 , PS min 6-10 , E Max 6, E Min 4, Rate Auto AVAPS Rate 2 (titrate for pt comfort) , bleed O2 at 5l/m continuous flow .    Closed displaced fracture of fifth metacarpal bone 03/21/2018   Cocaine use disorder, severe, in sustained remission (HCC) 12/17/2015   Complication of anesthesia    decreased bp, decreased heart rate   COPD (chronic obstructive pulmonary disease) (HCC) 07/08/2014   Delusional disorder, persecutory type (HCC) 06/26/2021   Depression    Diabetic neuropathy (HCC) 04/24/2011   Difficulty with speech 01/24/2018   Disorder of nervous system    Drug abuse (HCC) 11/21/2017   Dyslipidemia 04/24/2011   E. coli UTI 02/20/2020   Elevated troponin 04/28/2012   Emphysema    Encephalopathy 11/21/2017   Essential hypertension 03/22/2016   Facet arthropathy, lumbar 06/12/2022   Fibula fracture 07/10/2016   Frequent falls 10/11/2017   GERD (gastroesophageal reflux disease)    GI bleed 03/30/2021   Gout 04/11/2017   Heart attack (HCC) 1980s   Hematochezia    History of blood transfusion 1994   couldn't stop bleeding from my period   History of drug abuse in remission (HCC) 11/28/2015   Quit in 2017   Hyperlipidemia 03/31/2021   Hyperlipidemia LDL goal <70    Hypocalcemia    Hypokalemia    Hypomagnesemia    Incontinence    Manic depression (HCC)    Morbid obesity (HCC) 10/18/2010   Obstructive sleep apnea 10/18/2010   On home oxygen  therapy    6L; 24/7 (04/10/2016)   OSA on CPAP    wear mask sometimes (04/10/2016)   Painless rectal bleeding 12/31/2015   Paranoid (HCC)    sometimes; I'm on RX for it (04/10/2016)   Periumbilical abdominal pain    Prolonged Q-T interval on ECG     Rectal bleeding 12/31/2015   Recurrent syncope 07/09/2021   Rhabdomyolysis 06/16/2021   Schizoaffective disorder, bipolar type (HCC) 04/05/2018   Seasonal allergies    Seborrheic keratoses 12/31/2013   Seizures (HCC)    don't know what kind; last one was ~ 1 yr ago (04/10/2016)   Sinus bradycardia    Skin ulcer of sacrum, limited to breakdown of skin (HCC) 03/08/2022   Spondylolisthesis at L3-L4 level 06/12/2022   Stroke (HCC) 1980s   denies residual on 04/10/2016   Thrush 09/19/2013   Type 2 diabetes mellitus (HCC) 10/18/2010    PSH:   Past Surgical History:  Procedure Laterality Date   A/V FISTULAGRAM N/A 02/01/2023   Procedure: A/V Fistulagram;  Surgeon: Norine Manuelita LABOR, MD;  Location: [facility] INVASIVE CV LAB;  Service: Cardiovascular;  Laterality: N/A;   AV FISTULA  PLACEMENT Left 12/21/2022   Procedure: INSERTION OF LEFT ARM ARTERIOVENOUS (AV) GORE-TEX GRAFT;  Surgeon: Magda Debby SAILOR, MD;  Location: [facility] OR;  Service: Vascular;  Laterality: Left;   CESAREAN SECTION  1997   COLONOSCOPY WITH PROPOFOL  N/A 04/01/2021   Procedure: COLONOSCOPY WITH PROPOFOL ;  Surgeon: Rollin Dover, MD;  Location: [facility] ENDOSCOPY;  Service: Gastroenterology;  Laterality: N/A;  Rectal bleeding with drop in hemoglobin to 7.2 g/dL   HERNIA REPAIR     IR CHOLANGIOGRAM EXISTING TUBE  07/20/2016   IR PERC CHOLECYSTOSTOMY  05/10/2016   IR RADIOLOGIST EVAL & MGMT  06/08/2016   IR RADIOLOGIST EVAL & MGMT  06/29/2016   IR SINUS/FIST TUBE CHK-NON GI  07/12/2016   RIGHT/LEFT HEART CATH AND CORONARY ANGIOGRAPHY N/A 06/19/2017   Procedure: RIGHT/LEFT HEART CATH AND CORONARY ANGIOGRAPHY;  Surgeon: Cherrie Toribio SAUNDERS, MD;  Location: [facility] INVASIVE CV LAB;  Service: Cardiovascular;  Laterality: N/A;   TIBIA IM NAIL INSERTION Right 07/12/2016   Procedure: INTRAMEDULLARY (IM) NAIL RIGHT TIBIA;  Surgeon: Jerri Kay HERO, MD;  Location: [facility] OR;  Service: Orthopedics;  Laterality: Right;   UMBILICAL HERNIA REPAIR  ~ 1963    that's why I don't have a belly button   VAGINAL HYSTERECTOMY      Allergies:  Allergies  Allergen Reactions   Dorethia Dinning ] Other (See Comments)    GI bleed   Deltasone  [Prednisone ] Anaphylaxis and Swelling   Latuda [Lurasidone Hcl] Anaphylaxis   Lortab [Hydrocodone -Acetaminophen ] Hives and Shortness Of Breath   Magnesium -Containing Compounds Anaphylaxis and Other (See Comments)    Tolerated Ensure   Ultram  [Tramadol ] Anaphylaxis, Swelling and Rash   Codeine Nausea And Vomiting and Rash   Desyrel  [Trazodone ] Other (See Comments)    Paranoia    Peanut (Diagnostic) Hives and Other (See Comments)    Raw peanuts only  OK to eat peanut butter.   Apresoline  [Hydralazine ] Other (See Comments)    Muscle spasms   Sulfa Antibiotics Itching   Tape Rash   Topamax  [Topiramate ] Other (See Comments)    Paranoia    Medications:   Prior to Admission medications   Medication Sig Start Date End Date Taking? Authorizing Provider  amLODipine  (NORVASC ) 5 MG tablet Take 1 tablet (5 mg total) by mouth 2 (two) times daily. 09/06/23   Donnelly Mellow, MD  atorvastatin  (LIPITOR ) 20 MG tablet Take 1 tablet (20 mg total) by mouth daily. 07/07/23 10/05/23  [place], Eric J, DO  calcitRIOL  (ROCALTROL ) 0.5 MCG capsule Take 1 capsule (0.5 mcg total) by mouth daily. 07/07/23   [place], Camellia PARAS, DO  carvedilol  (COREG ) 12.5 MG tablet Take 1 tablet (12.5 mg total) by mouth 2 (two) times daily. 07/07/23 10/05/23  [place], Camellia PARAS, DO  docusate sodium  (COLACE) 100 MG capsule Take 1 capsule (100 mg total) by mouth daily. 09/07/23   Jadapalle, Sree, MD  DULoxetine  (CYMBALTA ) 30 MG capsule Take 1 capsule (30 mg total) by mouth daily. 09/06/23   Jadapalle, Sree, MD  epoetin  alfa (EPOGEN ) 10000 UNIT/ML injection Inject 1 mL (10,000 Units total) into the vein every Monday, Wednesday, and Friday with hemodialysis. 11/22/22   Cam Charlie Loving, DO  escitalopram  (LEXAPRO ) 10 MG tablet Take 1 tablet (10 mg total) by mouth daily.  09/06/23 12/05/23  Jadapalle, Sree, MD  ezetimibe  (ZETIA ) 10 MG tablet Take 1 tablet (10 mg total) by mouth daily. 09/07/23   Donnelly Mellow, MD  ferrous sulfate  325 (65 FE) MG tablet Take 1 tablet (325 mg total) by mouth  daily. 07/07/23 07/01/24  [place], Camellia PARAS, DO  folic acid  (FOLVITE ) 1 MG tablet Take 1 tablet (1 mg total) by mouth daily. 07/07/23   [place], Camellia PARAS, DO  Melatonin 10 MG TBDP Take 1 tablet (10 mg) by mouth at bedtime. 09/06/23   Donnelly Mellow, MD  metolazone  (ZAROXOLYN ) 2.5 MG tablet Take 1 tablet (2.5 mg total) by mouth every Monday, Wednesday, and Friday. 07/09/23 10/07/23  [place], Camellia PARAS, DO  omeprazole  (PRILOSEC) 40 MG capsule Take 1 capsule (40 mg total) by mouth every morning. 07/07/23 10/05/23  [place], Camellia PARAS, DO  pantoprazole  (PROTONIX ) 40 MG tablet Take 1 tablet (40 mg total) by mouth daily. 09/07/23   Donnelly Mellow, MD  potassium chloride  SA (KLOR-CON  M) 20 MEQ tablet Take 1 tablet (20 mEq total) by mouth 2 (two) times daily. 09/06/23   Donnelly Mellow, MD  prazosin  (MINIPRESS ) 2 MG capsule Take 1 capsule (2 mg total) by mouth at bedtime. Patient not taking: Reported on 08/14/2023 02/26/23     torsemide  40 MG TABS Take 40 mg by mouth 2 (two) times daily. 08/04/23   Lou Claretta HERO, MD    Discontinued Meds:  There are no discontinued medications.  Social History:  reports that she quit smoking about 7 years ago. Her smoking use included cigarettes. She started smoking about 46 years ago. She has a 58.6 pack-year smoking history. She has been exposed to tobacco smoke. She has never used smokeless tobacco. She reports that she does not currently use drugs after having used the following drugs: Cocaine. She reports that she does not drink alcohol.  Family History:   Family History  Problem Relation Age of Onset   Cancer Mother        lung   Depression Mother    Cancer Father        prostate   Depression Sister    Anxiety disorder Sister    Schizophrenia Sister     Bipolar disorder Sister    Depression Sister    Depression Brother    Heart failure Other        cousin    Blood pressure 120/69, pulse 97, temperature 99.1 F (37.3 C), temperature source Oral, resp. rate 18, height 5' 3 (1.6 m), weight 88 kg, SpO2 100%. General appearance: alert, cooperative, and appears stated age Head: Normocephalic, without obvious abnormality, atraumatic Eyes: negative Back: symmetric, no curvature. ROM normal. No CVA tenderness. Resp: rales bibasilar Cardio: regular rate and rhythm Extremities: edema tr Pulses: 2+ and symmetric Access: Right IJ TDC       Kirstie Larsen, LYNWOOD ORN, MD 09/10/2023, 4:53 PM

## 2023-09-10 NOTE — ED Provider Notes (Signed)
  Physical Exam  BP (!) 125/91   Pulse 87   Temp 98.5 F (36.9 C) (Oral)   Resp (!) 24   Ht 5' 3 (1.6 m)   Wt 88 kg   SpO2 99%   BMI 34.37 kg/m   Physical Exam  Procedures  Procedures  ED Course / MDM    Medical Decision Making Amount and/or Complexity of Data Reviewed Labs: ordered. Radiology: ordered.  Risk Prescription drug management.   Received in signout.  Pending dialysis.  Right hip pain after bending wrong.  Negative x-ray.  Pending dialysis.  Hopefully discharge home after       Patsey Lot, MD 09/10/23 2159

## 2023-09-10 NOTE — ED Triage Notes (Signed)
 Per EMS  SOB States she need dialysis Last treatment Friday No clinic set up here Wants Coreg  Last dose 4 days ago

## 2023-09-10 NOTE — ED Notes (Signed)
 XR bedside.

## 2023-09-10 NOTE — ED Provider Notes (Signed)
 [place] EMERGENCY DEPARTMENT AT [facility] Provider Note  CSN: [id number] Arrival date & time: 09/10/23 1444  Chief Complaint(s) Shortness of Breath and Medication Refill  HPI Tiffany [name] is a 63 y.o. female with past medical history as below, significant for CHF, anoxic brain injury, ESRD on HD Monday Wednesday Friday who presents to the ED with complaint of dyspnea, missed HD  Patient is dialysis Monday Wednesday Friday, she is in the process of relocating to [place]  and no longer has dialysis chair in the county.  She did not go to dialysis this morning.  She reports her last dialysis on Friday, denies any other missed sessions in the past 2 weeks.  Reports that she is gained quite a bit of weight, she feels like she is fluid overloaded, she has exertional dyspnea and orthopnea.  No fevers, chills, nausea or vomiting  Patient also reports pain to her right hip after adjusting positioning her wheelchair, feels she twisted her hip abnormally and has been hurting since then.  No fall.  She does not ambulate,  she is requesting Dilaudid   Past Medical History Past Medical History:  Diagnosis Date   Abdominal pain    Acute encephalopathy 11/21/2017   Acute exacerbation of CHF (congestive heart failure) (HCC) 03/03/2022   Acute GI bleeding 03/29/2021   Acute metabolic encephalopathy 02/20/2020   Aggressive behavior    Agitation 11/22/2017   Anoxic brain injury (HCC) 09/08/2016   C. Arrest due to respiratory failure and COPD exacerbation   Anxiety    Arthritis    all over (04/10/2016)   Asthma 10/18/2010   Binge eating disorder    Blood loss anemia 04/24/2011   CAP (community acquired pneumonia) 06/22/2015   Cardiac arrest (HCC) 09/08/2016   PEA   Carotid artery stenosis    1-39% bilateral by dopplers 11/2016   Chronic diastolic (congestive) heart failure (HCC)    Chronic pain syndrome 06/18/2012   Chronic post-traumatic stress disorder (PTSD) 05/27/2018    Chronic respiratory failure with hypoxia and hypercapnia (HCC) 06/22/2015   TRILOGY Vent >AVAPA-ES., Vt target 200-400, Max P 30 , PS max 20 , PS min 6-10 , E Max 6, E Min 4, Rate Auto AVAPS Rate 2 (titrate for pt comfort) , bleed O2 at 5l/m continuous flow .    Closed displaced fracture of fifth metacarpal bone 03/21/2018   Cocaine use disorder, severe, in sustained remission (HCC) 12/17/2015   Complication of anesthesia    decreased bp, decreased heart rate   COPD (chronic obstructive pulmonary disease) (HCC) 07/08/2014   Delusional disorder, persecutory type (HCC) 06/26/2021   Depression    Diabetic neuropathy (HCC) 04/24/2011   Difficulty with speech 01/24/2018   Disorder of nervous system    Drug abuse (HCC) 11/21/2017   Dyslipidemia 04/24/2011   E. coli UTI 02/20/2020   Elevated troponin 04/28/2012   Emphysema    Encephalopathy 11/21/2017   Essential hypertension 03/22/2016   Facet arthropathy, lumbar 06/12/2022   Fibula fracture 07/10/2016   Frequent falls 10/11/2017   GERD (gastroesophageal reflux disease)    GI bleed 03/30/2021   Gout 04/11/2017   Heart attack (HCC) 1980s   Hematochezia    History of blood transfusion 1994   couldn't stop bleeding from my period   History of drug abuse in remission (HCC) 11/28/2015   Quit in 2017   Hyperlipidemia 03/31/2021   Hyperlipidemia LDL goal <70    Hypocalcemia    Hypokalemia    Hypomagnesemia  Incontinence    Manic depression (HCC)    Morbid obesity (HCC) 10/18/2010   Obstructive sleep apnea 10/18/2010   On home oxygen  therapy    6L; 24/7 (04/10/2016)   OSA on CPAP    wear mask sometimes (04/10/2016)   Painless rectal bleeding 12/31/2015   Paranoid (HCC)    sometimes; I'm on RX for it (04/10/2016)   Periumbilical abdominal pain    Prolonged Q-T interval on ECG    Rectal bleeding 12/31/2015   Recurrent syncope 07/09/2021   Rhabdomyolysis 06/16/2021   Schizoaffective disorder, bipolar type (HCC) 04/05/2018    Seasonal allergies    Seborrheic keratoses 12/31/2013   Seizures (HCC)    don't know what kind; last one was ~ 1 yr ago (04/10/2016)   Sinus bradycardia    Skin ulcer of sacrum, limited to breakdown of skin (HCC) 03/08/2022   Spondylolisthesis at L3-L4 level 06/12/2022   Stroke (HCC) 1980s   denies residual on 04/10/2016   Thrush 09/19/2013   Type 2 diabetes mellitus (HCC) 10/18/2010   Patient Active Problem List   Diagnosis Date Noted   ESRD (end stage renal disease) (HCC) 07/30/2023   ESRD (end stage renal disease) on dialysis (HCC) 07/10/2023   Sepsis (HCC) 06/27/2023   Homicidal ideation 06/01/2023   MDD (major depressive disorder), recurrent, severe, with psychosis (HCC) 03/10/2023   Influenza A 02/14/2023   Community acquired pneumonia 02/14/2023   ESRD on dialysis (HCC) 01/30/2023   Schizoaffective disorder (HCC) 11/10/2022   CKD (chronic kidney disease) stage 5, GFR less than 15 ml/min (HCC) 09/12/2022   AKI (acute kidney injury) (HCC) 06/21/2022   Spondylolisthesis at L3-L4 level 06/12/2022   Facet arthropathy, lumbar 06/12/2022   Pelvic floor weakness in female 06/12/2022   Lower extremity weakness 06/11/2022   Acute on chronic diastolic heart failure (HCC) 06/10/2022   Hip pain 06/10/2022   Urinary incontinence 03/08/2022   Incontinence of feces 03/08/2022   Neck pain 02/28/2022   Dyspnea 02/03/2022   Subacute osteomyelitis of lumbar spine (HCC) 09/27/2021   Bipolar affective disorder, currently depressed, mild (HCC) 09/23/2021   History of falling 07/01/2021   Anemia of renal disease 06/16/2021   Anxiety and depression 05/25/2021   Physical deconditioning 05/25/2021   Cauda equina compression (HCC) 04/24/2021   Severe Spinal stenosis, lumbar 04/24/2021   Hyperlipidemia 03/31/2021   Coronary artery disease involving native coronary artery of native heart without angina pectoris 01/16/2021   Anxiety disorder, unspecified 01/16/2021   GERD (gastroesophageal  reflux disease)    Polypharmacy 11/17/2020   Schizophrenia, paranoid (HCC) 02/20/2020   Hypersomnia, Chronic    Adnexal cyst, right 01/02/2020   Genital herpes 11/25/2019   Family discord 02/04/2019   PTSD (post-traumatic stress disorder) 05/27/2018   Schizoaffective disorder, bipolar type (HCC) 04/05/2018   Acute hypoxic respiratory failure (HCC)    Frequent falls 10/11/2017   Dependence on continuous supplemental oxygen  05/14/2017   Gout 04/11/2017   Chronic kidney disease (CKD), stage IV (severe) (HCC) 12/15/2016   Chest pain 11/03/2016   History of Anoxic brain injury (HCC) 09/08/2016   Overactive bladder 06/07/2016   OSA and COPD overlap syndrome (HCC)    Osteoarthritis, multiple sites    Acute renal failure superimposed on stage 4 chronic kidney disease (HCC) 11/24/2015   Tobacco use disorder 07/22/2014   Seizure (HCC) 01/04/2013   Chronic low back pain with sciatica 06/18/2012   Morbid obesity (HCC) 10/18/2010   Home Medication(s) Prior to Admission medications   Medication Sig Start  Date End Date Taking? Authorizing Provider  amLODipine  (NORVASC ) 5 MG tablet Take 1 tablet (5 mg total) by mouth 2 (two) times daily. 09/06/23   Jadapalle, Sree, MD  atorvastatin  (LIPITOR ) 20 MG tablet Take 1 tablet (20 mg total) by mouth daily. 07/07/23 10/05/23  [place], Camellia PARAS, DO  calcitRIOL  (ROCALTROL ) 0.5 MCG capsule Take 1 capsule (0.5 mcg total) by mouth daily. 07/07/23   [place], Camellia PARAS, DO  carvedilol  (COREG ) 12.5 MG tablet Take 1 tablet (12.5 mg total) by mouth 2 (two) times daily. 07/07/23 10/05/23  [place], Camellia PARAS, DO  docusate sodium  (COLACE) 100 MG capsule Take 1 capsule (100 mg total) by mouth daily. 09/07/23   Donnelly Mellow, MD  DULoxetine  (CYMBALTA ) 30 MG capsule Take 1 capsule (30 mg total) by mouth daily. 09/06/23   Donnelly Mellow, MD  epoetin  alfa (EPOGEN ) 10000 UNIT/ML injection Inject 1 mL (10,000 Units total) into the vein every Monday, Wednesday, and Friday with hemodialysis.  11/22/22   Cam Charlie Loving, DO  escitalopram  (LEXAPRO ) 10 MG tablet Take 1 tablet (10 mg total) by mouth daily. 09/06/23 12/05/23  Jadapalle, Sree, MD  ezetimibe  (ZETIA ) 10 MG tablet Take 1 tablet (10 mg total) by mouth daily. 09/07/23   Donnelly Mellow, MD  ferrous sulfate  325 (65 FE) MG tablet Take 1 tablet (325 mg total) by mouth daily. 07/07/23 07/01/24  [place], Camellia PARAS, DO  folic acid  (FOLVITE ) 1 MG tablet Take 1 tablet (1 mg total) by mouth daily. 07/07/23   [place], Camellia PARAS, DO  Melatonin 10 MG TBDP Take 1 tablet (10 mg) by mouth at bedtime. 09/06/23   Donnelly Mellow, MD  metolazone  (ZAROXOLYN ) 2.5 MG tablet Take 1 tablet (2.5 mg total) by mouth every Monday, Wednesday, and Friday. 07/09/23 10/07/23  [place], Eric J, DO  omeprazole  (PRILOSEC) 40 MG capsule Take 1 capsule (40 mg total) by mouth every morning. 07/07/23 10/05/23  [place], Camellia PARAS, DO  pantoprazole  (PROTONIX ) 40 MG tablet Take 1 tablet (40 mg total) by mouth daily. 09/07/23   Donnelly Mellow, MD  potassium chloride  SA (KLOR-CON  M) 20 MEQ tablet Take 1 tablet (20 mEq total) by mouth 2 (two) times daily. 09/06/23   Donnelly Mellow, MD  prazosin  (MINIPRESS ) 2 MG capsule Take 1 capsule (2 mg total) by mouth at bedtime. Patient not taking: Reported on 08/14/2023 02/26/23     torsemide  40 MG TABS Take 40 mg by mouth 2 (two) times daily. 08/04/23   Lou Claretta HERO, MD                                                                                                                                    Past Surgical History Past Surgical History:  Procedure Laterality Date   A/V FISTULAGRAM N/A 02/01/2023   Procedure: A/V Fistulagram;  Surgeon: Norine Manuelita LABOR, MD;  Location: [facility] INVASIVE CV LAB;  Service: Cardiovascular;  Laterality: N/A;   AV FISTULA  PLACEMENT Left 12/21/2022   Procedure: INSERTION OF LEFT ARM ARTERIOVENOUS (AV) GORE-TEX GRAFT;  Surgeon: Magda Debby SAILOR, MD;  Location: [facility] OR;  Service: Vascular;  Laterality: Left;   CESAREAN  SECTION  1997   COLONOSCOPY WITH PROPOFOL  N/A 04/01/2021   Procedure: COLONOSCOPY WITH PROPOFOL ;  Surgeon: Rollin Dover, MD;  Location: [facility] ENDOSCOPY;  Service: Gastroenterology;  Laterality: N/A;  Rectal bleeding with drop in hemoglobin to 7.2 g/dL   HERNIA REPAIR     IR CHOLANGIOGRAM EXISTING TUBE  07/20/2016   IR PERC CHOLECYSTOSTOMY  05/10/2016   IR RADIOLOGIST EVAL & MGMT  06/08/2016   IR RADIOLOGIST EVAL & MGMT  06/29/2016   IR SINUS/FIST TUBE CHK-NON GI  07/12/2016   RIGHT/LEFT HEART CATH AND CORONARY ANGIOGRAPHY N/A 06/19/2017   Procedure: RIGHT/LEFT HEART CATH AND CORONARY ANGIOGRAPHY;  Surgeon: Cherrie Toribio SAUNDERS, MD;  Location: [facility] INVASIVE CV LAB;  Service: Cardiovascular;  Laterality: N/A;   TIBIA IM NAIL INSERTION Right 07/12/2016   Procedure: INTRAMEDULLARY (IM) NAIL RIGHT TIBIA;  Surgeon: Jerri Kay HERO, MD;  Location: [facility] OR;  Service: Orthopedics;  Laterality: Right;   UMBILICAL HERNIA REPAIR  ~ 1963   that's why I don't have a belly button   VAGINAL HYSTERECTOMY     Family History Family History  Problem Relation Age of Onset   Cancer Mother        lung   Depression Mother    Cancer Father        prostate   Depression Sister    Anxiety disorder Sister    Schizophrenia Sister    Bipolar disorder Sister    Depression Sister    Depression Brother    Heart failure Other        cousin    Social History Social History   Tobacco Use   Smoking status: Former    Current packs/day: 0.00    Average packs/day: 1.5 packs/day for 39.1 years (58.6 ttl pk-yrs)    Types: Cigarettes    Start date: 03/13/1977    Quit date: 04/10/2016    Years since quitting: 7.4    Passive exposure: Past   Smokeless tobacco: Never  Vaping Use   Vaping status: Never Used  Substance Use Topics   Alcohol use: No    Alcohol/week: 0.0 standard drinks of alcohol   Drug use: Not Currently    Types: Cocaine    Comment: 04/10/2016 last used cocaine back in November 2017   Allergies Asa  [aspirin ], Deltasone  [prednisone ], Latuda [lurasidone hcl], Lortab [hydrocodone -acetaminophen ], Magnesium -containing compounds, Ultram  [tramadol ], Codeine, Desyrel  [trazodone ], Peanut (diagnostic), Apresoline  [hydralazine ], Sulfa antibiotics, Tape, and Topamax  [topiramate ]  Review of Systems A thorough review of systems was obtained and all systems are negative except as noted in the HPI and PMH.   Physical Exam Vital Signs  I have reviewed the triage vital signs BP 120/69 (BP Location: Right Arm)   Pulse 97   Temp 99.1 F (37.3 C) (Oral)   Resp 18   Ht 5' 3 (1.6 m)   Wt 88 kg   SpO2 100%   BMI 34.37 kg/m  Physical Exam Vitals and nursing note reviewed.  Constitutional:      General: [name] not in acute distress.    Appearance: Normal appearance. [name] well-developed. [name] obese. [name] not ill-appearing.  HENT:     Head: Normocephalic and atraumatic.     Right Ear: External ear normal.     Left Ear: External ear normal.  Nose: Nose normal.     Mouth/Throat:     Mouth: Mucous membranes are moist.  Eyes:     General: No scleral icterus.       Right eye: No discharge.        Left eye: No discharge.  Cardiovascular:     Rate and Rhythm: Normal rate.  Pulmonary:     Effort: Pulmonary effort is normal. No respiratory distress.     Breath sounds: No stridor.  Chest:    Abdominal:     General: Abdomen is flat. There is no distension.     Tenderness: There is no guarding.  Musculoskeletal:        General: No deformity.     Cervical back: No rigidity.     Right lower leg: Edema present.     Left lower leg: Edema present.  Skin:    General: Skin is warm and dry.     Coloration: Skin is not cyanotic, jaundiced or pale.  Neurological:     Mental Status: [name] alert and oriented to person, place, and time.     GCS: GCS eye subscore is 4. GCS verbal subscore is 5. GCS motor subscore is 6.  Psychiatric:        Speech: Speech normal.        Behavior: Behavior normal.  Behavior is cooperative.     ED Results and Treatments Labs (all labs ordered are listed, but only abnormal results are displayed) Labs Reviewed  BASIC METABOLIC PANEL WITH GFR - Abnormal; Notable for the following components:      Result Value   CO2 20 (*)    BUN 48 (*)    Creatinine, Ser 4.78 (*)    GFR, Estimated 10 (*)    All other components within normal limits  CBC - Abnormal; Notable for the following components:   RBC 2.58 (*)    Hemoglobin 7.5 (*)    HCT 25.5 (*)    MCHC 29.4 (*)    RDW 18.6 (*)    All other components within normal limits  BRAIN NATRIURETIC PEPTIDE - Abnormal; Notable for the following components:   B Natriuretic Peptide 151.8 (*)    All other components within normal limits                                                                                                                          Radiology DG Chest 2 View Result Date: 09/10/2023 CLINICAL DATA:  Short of breath EXAM: CHEST - 2 VIEW COMPARISON:  07/27/2023 FINDINGS: Central venous line unchanged. Stable cardiac silhouette. No effusion, infiltrate, or pneumothorax. No acute osseous abnormality. IMPRESSION: No acute cardiopulmonary process. Electronically Signed   By: Jackquline Boxer M.D.   On: 09/10/2023 15:53    Pertinent labs & imaging results that were available during my care of the patient were reviewed by me and considered in my medical decision making (see MDM for details).  Medications Ordered in ED Medications  morphine  (PF) 2 MG/ML injection 2 mg (2 mg Intravenous Given 09/10/23 1634)  ondansetron  (ZOFRAN ) injection 4 mg (4 mg Intravenous Given 09/10/23 1634)                                                                                                                                     Procedures Procedures  (including critical care time)  Medical Decision Making / ED Course    Medical Decision Making:    Tiffany [name] is a 63 y.o. female with past medical history  as below, significant for CHF, anoxic brain injury, ESRD on HD Monday Wednesday Friday who presents to the ED with complaint of dyspnea, missed HD. The complaint involves an extensive differential diagnosis and also carries with it a high risk of complications and morbidity.  Serious etiology was considered. Ddx includes but is not limited to: In my evaluation of this patient's dyspnea my DDx includes, but is not limited to, pneumonia, pulmonary embolism, pneumothorax, pulmonary edema, metabolic acidosis, asthma, COPD, cardiac cause, anemia, anxiety, fracture, dislocation etc.    Complete initial physical exam performed, notably the patient was in Nad, HDS.    Reviewed and confirmed nursing documentation for past medical history, family history, social history.  Vital signs reviewed.    ESRD on HD > - Monday Wednesday Friday, missed today.  Last session was on Friday.  Feels she is volume overloaded.  No hypoxia.  Spoke with renal who will arrange for dialysis while in the ER  Hip pain > - Atraumatic hip injury, exam is reassuring, x-ray, analgesic - likely muscle strain, pt feeling better, xr is wnl, no fx  Handoff to incoming edp pending HD                       Additional history obtained: -Additional history obtained from na -External records from outside source obtained and reviewed including: Chart review including previous notes, labs, imaging, consultation notes including  Prior er eval Prior labs   Lab Tests: -I ordered, reviewed, and interpreted labs.   The pertinent results include:   Labs Reviewed  BASIC METABOLIC PANEL WITH GFR - Abnormal; Notable for the following components:      Result Value   CO2 20 (*)    BUN 48 (*)    Creatinine, Ser 4.78 (*)    GFR, Estimated 10 (*)    All other components within normal limits  CBC - Abnormal; Notable for the following components:   RBC 2.58 (*)    Hemoglobin 7.5 (*)    HCT 25.5 (*)    MCHC 29.4 (*)    RDW  18.6 (*)    All other components within normal limits  BRAIN NATRIURETIC PEPTIDE - Abnormal; Notable for the following components:   B Natriuretic Peptide 151.8 (*)    All other components within normal limits    Notable for c/w esrd  EKG   EKG Interpretation Date/Time:    Ventricular Rate:    PR Interval:    QRS Duration:    QT Interval:    QTC Calculation:   R Axis:      Text Interpretation:           Imaging Studies ordered: I ordered imaging studies including cxr hip xr I independently visualized the following imaging with scope of interpretation limited to determining acute life threatening conditions related to emergency care; findings noted above I agree with the radiologist interpretation If any imaging was obtained with contrast I closely monitored patient for any possible adverse reaction a/w contrast administration in the emergency department   Medicines ordered and prescription drug management: Meds ordered this encounter  Medications   morphine  (PF) 2 MG/ML injection 2 mg   ondansetron  (ZOFRAN ) injection 4 mg    -I have reviewed the patients home medicines and have made adjustments as needed   Consultations Obtained: I requested consultation with the nephro,  and discussed lab and imaging findings as well as pertinent plan - they recommend: HD today   Cardiac Monitoring: Continuous pulse oximetry interpreted by myself, 99% on ra.    Social Determinants of Health:  Diagnosis or treatment significantly limited by social determinants of health: former smoker and obesity   Reevaluation: After the interventions noted above, I reevaluated the patient and found that they have improved  Co morbidities that complicate the patient evaluation  Past Medical History:  Diagnosis Date   Abdominal pain    Acute encephalopathy 11/21/2017   Acute exacerbation of CHF (congestive heart failure) (HCC) 03/03/2022   Acute GI bleeding 03/29/2021   Acute metabolic  encephalopathy 02/20/2020   Aggressive behavior    Agitation 11/22/2017   Anoxic brain injury (HCC) 09/08/2016   C. Arrest due to respiratory failure and COPD exacerbation   Anxiety    Arthritis    all over (04/10/2016)   Asthma 10/18/2010   Binge eating disorder    Blood loss anemia 04/24/2011   CAP (community acquired pneumonia) 06/22/2015   Cardiac arrest (HCC) 09/08/2016   PEA   Carotid artery stenosis    1-39% bilateral by dopplers 11/2016   Chronic diastolic (congestive) heart failure (HCC)    Chronic pain syndrome 06/18/2012   Chronic post-traumatic stress disorder (PTSD) 05/27/2018   Chronic respiratory failure with hypoxia and hypercapnia (HCC) 06/22/2015   TRILOGY Vent >AVAPA-ES., Vt target 200-400, Max P 30 , PS max 20 , PS min 6-10 , E Max 6, E Min 4, Rate Auto AVAPS Rate 2 (titrate for pt comfort) , bleed O2 at 5l/m continuous flow .    Closed displaced fracture of fifth metacarpal bone 03/21/2018   Cocaine use disorder, severe, in sustained remission (HCC) 12/17/2015   Complication of anesthesia    decreased bp, decreased heart rate   COPD (chronic obstructive pulmonary disease) (HCC) 07/08/2014   Delusional disorder, persecutory type (HCC) 06/26/2021   Depression    Diabetic neuropathy (HCC) 04/24/2011   Difficulty with speech 01/24/2018   Disorder of nervous system    Drug abuse (HCC) 11/21/2017   Dyslipidemia 04/24/2011   E. coli UTI 02/20/2020   Elevated troponin 04/28/2012   Emphysema    Encephalopathy 11/21/2017   Essential hypertension 03/22/2016   Facet arthropathy, lumbar 06/12/2022   Fibula fracture 07/10/2016   Frequent falls 10/11/2017   GERD (gastroesophageal reflux disease)    GI bleed 03/30/2021   Gout 04/11/2017   Heart attack (HCC) 1980s  Hematochezia    History of blood transfusion 1994   couldn't stop bleeding from my period   History of drug abuse in remission (HCC) 11/28/2015   Quit in 2017   Hyperlipidemia 03/31/2021    Hyperlipidemia LDL goal <70    Hypocalcemia    Hypokalemia    Hypomagnesemia    Incontinence    Manic depression (HCC)    Morbid obesity (HCC) 10/18/2010   Obstructive sleep apnea 10/18/2010   On home oxygen  therapy    6L; 24/7 (04/10/2016)   OSA on CPAP    wear mask sometimes (04/10/2016)   Painless rectal bleeding 12/31/2015   Paranoid (HCC)    sometimes; I'm on RX for it (04/10/2016)   Periumbilical abdominal pain    Prolonged Q-T interval on ECG    Rectal bleeding 12/31/2015   Recurrent syncope 07/09/2021   Rhabdomyolysis 06/16/2021   Schizoaffective disorder, bipolar type (HCC) 04/05/2018   Seasonal allergies    Seborrheic keratoses 12/31/2013   Seizures (HCC)    don't know what kind; last one was ~ 1 yr ago (04/10/2016)   Sinus bradycardia    Skin ulcer of sacrum, limited to breakdown of skin (HCC) 03/08/2022   Spondylolisthesis at L3-L4 level 06/12/2022   Stroke [facility]) 1980s   denies residual on 04/10/2016   Thrush 09/19/2013   Type 2 diabetes mellitus (HCC) 10/18/2010      Dispostion: Disposition decision including need for hospitalization was considered, and patient disposition pending at time of sign out.    Final Clinical Impression(s) / ED Diagnoses Final diagnoses:  ESRD on hemodialysis (HCC)        Elnor Jayson LABOR, DO 09/11/23 [phone]

## 2023-09-10 NOTE — ED Notes (Signed)
 Pt taken to HD.

## 2023-09-10 NOTE — ED Notes (Signed)
Pt went to Ct

## 2023-09-11 LAB — HEPATITIS B SURFACE ANTIBODY, QUANTITATIVE: Hep B S AB Quant (Post): 2893 m[IU]/mL

## 2023-09-11 MED ORDER — HEPARIN SODIUM (PORCINE) 1000 UNIT/ML IJ SOLN
INTRAMUSCULAR | Status: AC
  Filled 2023-09-11: qty 4

## 2023-09-11 NOTE — ED Provider Notes (Signed)
 Returned from dialysis, feeling markedly improved.  Wants to go home    NCAT PERRL RRR CTAB NABS   No signs of systemic illness or infection. The patient is nontoxic-appearing on exam and vital signs are within normal limits.  I have reviewed the triage vital signs and the nursing notes. Pertinent labs & imaging results that were available during my care of the patient were reviewed by me and considered in my medical decision making (see chart for details). After history, exam, and medical workup I feel the patient has been appropriately medically screened and is safe for discharge home. Pertinent diagnoses were discussed with the patient. Patient was given return precautions.    Rochele Lueck, MD 09/11/23 [phone]

## 2023-09-11 NOTE — ED Notes (Signed)
 [facility] called for transport back to [address] [place], [place] [address]

## 2023-09-11 NOTE — Progress Notes (Signed)
 HD Note:  Some information was entered later than the data was gathered due to patient care needs. The stated time with the data is accurate.  Received patient in bed to unit.   Alert and oriented.   Informed consent signed and in chart.   Access used: Right Chest [facility] Access issues: None  Patient did not tolerated treatment well.  Cramping.   TX duration: 3 hours 25 min  Alert, without acute distress.  Total UF removed: 2.2L  Hand-off given to patient's nurse.   Transported back to the ED.   Jack Mineau RN [facility] D[facility] Unit.

## 2023-09-11 NOTE — ED Notes (Signed)
 This RN provided patient with sandwhich bag and drink.

## 2023-09-11 NOTE — ED Notes (Signed)
Breakfast tray order

## 2023-09-11 NOTE — ED Notes (Signed)
 This RN reviewed discharge instructions with patient. She verbalized understanding and denied any further questions. PT well appearing upon discharge and reports no pain. Pt taken by PTAR to address [address] [place] [address]

## 2023-09-14 ENCOUNTER — Emergency Department (HOSPITAL_COMMUNITY)
Admission: EM | Admit: 2023-09-14 | Discharge: 2023-09-15 | Disposition: A | Discharge status: Home / Self Care | Attending: Emergency Medicine | Admitting: Emergency Medicine

## 2023-09-14 ENCOUNTER — Emergency Department (HOSPITAL_COMMUNITY)

## 2023-09-14 ENCOUNTER — Other Ambulatory Visit: Payer: Self-pay

## 2023-09-14 ENCOUNTER — Encounter (HOSPITAL_COMMUNITY): Payer: Self-pay

## 2023-09-14 DIAGNOSIS — E875 Hyperkalemia: Secondary | ICD-10-CM | POA: Insufficient documentation

## 2023-09-14 DIAGNOSIS — Z91010 Allergy to peanuts: Secondary | ICD-10-CM | POA: Insufficient documentation

## 2023-09-14 DIAGNOSIS — R0602 Shortness of breath: Secondary | ICD-10-CM | POA: Diagnosis present

## 2023-09-14 DIAGNOSIS — Z992 Dependence on renal dialysis: Secondary | ICD-10-CM | POA: Insufficient documentation

## 2023-09-14 DIAGNOSIS — N186 End stage renal disease: Secondary | ICD-10-CM | POA: Insufficient documentation

## 2023-09-14 LAB — COMPREHENSIVE METABOLIC PANEL WITH GFR
ALT: 12 U/L (ref 0–44)
AST: 18 U/L (ref 15–41)
Albumin: 3.1 g/dL — ABNORMAL LOW (ref 3.5–5.0)
Alkaline Phosphatase: 107 U/L (ref 38–126)
Anion gap: 11 (ref 5–15)
BUN: 60 mg/dL — ABNORMAL HIGH (ref 8–23)
CO2: 21 mmol/L — ABNORMAL LOW (ref 22–32)
Calcium: 8.9 mg/dL (ref 8.9–10.3)
Chloride: 107 mmol/L (ref 98–111)
Creatinine, Ser: 4.91 mg/dL — ABNORMAL HIGH (ref 0.44–1.00)
GFR, Estimated: 9 mL/min — ABNORMAL LOW (ref >60)
Glucose, Bld: 93 mg/dL (ref 70–99)
Potassium: 5.2 mmol/L — ABNORMAL HIGH (ref 3.5–5.1)
Sodium: 139 mmol/L (ref 135–145)
Total Bilirubin: 0.7 mg/dL (ref 0.0–1.2)
Total Protein: 6.4 g/dL — ABNORMAL LOW (ref 6.5–8.1)

## 2023-09-14 LAB — CBC
HCT: 25.3 % — ABNORMAL LOW (ref 36.0–46.0)
Hemoglobin: 7.5 g/dL — ABNORMAL LOW (ref 12.0–15.0)
MCH: 29.2 pg (ref 26.0–34.0)
MCHC: 29.6 g/dL — ABNORMAL LOW (ref 30.0–36.0)
MCV: 98.4 fL (ref 80.0–100.0)
Platelets: 266 K/uL (ref 150–400)
RBC: 2.57 MIL/uL — ABNORMAL LOW (ref 3.87–5.11)
RDW: 18 % — ABNORMAL HIGH (ref 11.5–15.5)
WBC: 8.4 K/uL (ref 4.0–10.5)
nRBC: 0 % (ref 0.0–0.2)

## 2023-09-14 MED ORDER — ALTEPLASE 2 MG IJ SOLR: 2.0000 mg | Freq: Once | INTRAMUSCULAR | Status: DC | PRN

## 2023-09-14 MED ORDER — HEPARIN SODIUM (PORCINE) 1000 UNIT/ML DIALYSIS: 3000.0000 [IU] | Freq: Once | INTRAMUSCULAR | Status: DC

## 2023-09-14 MED ORDER — LIDOCAINE-PRILOCAINE 2.5-2.5 % EX CREA: 1.0000 | TOPICAL_CREAM | CUTANEOUS | Status: DC | PRN

## 2023-09-14 MED ORDER — ANTICOAGULANT SODIUM CITRATE 4% (200MG/5ML) IV SOLN
5.0000 mL | Status: DC | PRN
Start: 2023-09-14 — End: 2023-09-15

## 2023-09-14 MED ORDER — CHLORHEXIDINE GLUCONATE CLOTH 2 % EX PADS: 6.0000 | MEDICATED_PAD | Freq: Every day | CUTANEOUS | Status: DC

## 2023-09-14 MED ORDER — HEPARIN SODIUM (PORCINE) 1000 UNIT/ML DIALYSIS: 1000.0000 [IU] | INTRAMUSCULAR | Status: DC | PRN

## 2023-09-14 MED ORDER — PENTAFLUOROPROP-TETRAFLUOROETH EX AERO: 1.0000 | INHALATION_SPRAY | CUTANEOUS | Status: DC | PRN

## 2023-09-14 MED ORDER — LIDOCAINE HCL (PF) 1 % IJ SOLN: 5.0000 mL | INTRAMUSCULAR | Status: DC | PRN

## 2023-09-14 NOTE — ED Triage Notes (Signed)
 Pt came in via POV requesting dialysis. Did miss her last Tx on Wednesday & now has bil leg swelling & some SOB.

## 2023-09-14 NOTE — ED Provider Notes (Signed)
 [place] EMERGENCY DEPARTMENT AT [facility] Provider Note   CSN: [id number] Arrival date & time: 09/14/23  8371     Patient presents with: Dialysis and Shortness of Breath   Tiffany [name] is a 63 y.o. female.   Shortness of Breath  Patient is a 63 year old female to the ED today with needing dialysis, noted to have mild shortness of breath, which she says she gets if she misses dialysis.  Does not have any chest pain, abdominal pain or complaints otherwise.  Says that she last had full hemodialysis on Monday but missed her Wednesday appointment.  Notes that she feels normal otherwise but does note her legs are also swollen which is regular for her when she has missed dialysis.    Denies fever, headache, vision changes, chest pain, abdominal pain, nausea, vomiting, hematochezia, melena.  Prior to Admission medications   Medication Sig Start Date End Date Taking? Authorizing Provider  amLODipine  (NORVASC ) 5 MG tablet Take 1 tablet (5 mg total) by mouth 2 (two) times daily. 09/06/23   Jadapalle, Sree, MD  atorvastatin  (LIPITOR ) 20 MG tablet Take 1 tablet (20 mg total) by mouth daily. 07/07/23 10/05/23  [place], Eric J, DO  calcitRIOL  (ROCALTROL ) 0.5 MCG capsule Take 1 capsule (0.5 mcg total) by mouth daily. 07/07/23   [place], Camellia PARAS, DO  carvedilol  (COREG ) 12.5 MG tablet Take 1 tablet (12.5 mg total) by mouth 2 (two) times daily. 07/07/23 10/05/23  [place], Camellia PARAS, DO  docusate sodium  (COLACE) 100 MG capsule Take 1 capsule (100 mg total) by mouth daily. 09/07/23   Jadapalle, Sree, MD  DULoxetine  (CYMBALTA ) 30 MG capsule Take 1 capsule (30 mg total) by mouth daily. 09/06/23   Donnelly Mellow, MD  epoetin  alfa (EPOGEN ) 10000 UNIT/ML injection Inject 1 mL (10,000 Units total) into the vein every Monday, Wednesday, and Friday with hemodialysis. 11/22/22   Cam Charlie Loving, DO  escitalopram  (LEXAPRO ) 10 MG tablet Take 1 tablet (10 mg total) by mouth daily. 09/06/23 12/05/23   Jadapalle, Sree, MD  ezetimibe  (ZETIA ) 10 MG tablet Take 1 tablet (10 mg total) by mouth daily. 09/07/23   Donnelly Mellow, MD  ferrous sulfate  325 (65 FE) MG tablet Take 1 tablet (325 mg total) by mouth daily. 07/07/23 07/01/24  [place], Eric J, DO  folic acid  (FOLVITE ) 1 MG tablet Take 1 tablet (1 mg total) by mouth daily. 07/07/23   [place], Camellia PARAS, DO  Melatonin 10 MG TBDP Take 1 tablet (10 mg) by mouth at bedtime. 09/06/23   Donnelly Mellow, MD  metolazone  (ZAROXOLYN ) 2.5 MG tablet Take 1 tablet (2.5 mg total) by mouth every Monday, Wednesday, and Friday. 07/09/23 10/07/23  [place], Camellia PARAS, DO  omeprazole  (PRILOSEC) 40 MG capsule Take 1 capsule (40 mg total) by mouth every morning. 07/07/23 10/05/23  [place], Camellia PARAS, DO  pantoprazole  (PROTONIX ) 40 MG tablet Take 1 tablet (40 mg total) by mouth daily. 09/07/23   Donnelly Mellow, MD  potassium chloride  SA (KLOR-CON  M) 20 MEQ tablet Take 1 tablet (20 mEq total) by mouth 2 (two) times daily. 09/06/23   Donnelly Mellow, MD  prazosin  (MINIPRESS ) 2 MG capsule Take 1 capsule (2 mg total) by mouth at bedtime. Patient not taking: Reported on 08/14/2023 02/26/23     torsemide  40 MG TABS Take 40 mg by mouth 2 (two) times daily. 08/04/23   Amponsah, Prosper M, MD    Allergies: Asa [aspirin ], Deltasone  [prednisone ], Latuda [lurasidone hcl], Lortab [hydrocodone -acetaminophen ], Magnesium -containing compounds, Ultram  [tramadol ], Codeine, Desyrel  [  trazodone ], Peanut (diagnostic), Apresoline  [hydralazine ], Sulfa antibiotics, Tape, and Topamax  [topiramate ]    Review of Systems  Respiratory:  Positive for shortness of breath.   All other systems reviewed and are negative.   Updated Vital Signs BP (!) 152/79   Pulse (!) 101   Temp 98.3 F (36.8 C) (Oral)   Resp (!) 28   SpO2 99%   Physical Exam Vitals and nursing note reviewed.  Constitutional:      General: [name] not in acute distress.    Appearance: Normal appearance. [name] not ill-appearing or diaphoretic.   HENT:     Head: Normocephalic and atraumatic.  Eyes:     General: No scleral icterus.       Right eye: No discharge.        Left eye: No discharge.     Extraocular Movements: Extraocular movements intact.     Conjunctiva/sclera: Conjunctivae normal.  Cardiovascular:     Rate and Rhythm: Normal rate and regular rhythm.     Pulses: Normal pulses.     Heart sounds: Normal heart sounds. No murmur heard.    No friction rub. No gallop.  Pulmonary:     Effort: Pulmonary effort is normal. No tachypnea or respiratory distress.     Breath sounds: No stridor. No decreased breath sounds, wheezing, rhonchi or rales.  Chest:     Chest wall: No tenderness.  Abdominal:     General: Abdomen is flat. There is no distension.     Palpations: Abdomen is soft.     Tenderness: There is no abdominal tenderness. There is no right CVA tenderness, left CVA tenderness, guarding or rebound.  Musculoskeletal:        General: No swelling, deformity or signs of injury.     Cervical back: Normal range of motion. No rigidity.     Right lower leg: No tenderness. Edema present.     Left lower leg: No tenderness. Edema present.  Skin:    General: Skin is warm and dry.     Findings: No bruising, erythema or lesion.  Neurological:     General: No focal deficit present.     Mental Status: [name] alert and oriented to person, place, and time. Mental status is at baseline.     Sensory: No sensory deficit.     Motor: No weakness.  Psychiatric:        Mood and Affect: Mood normal.     (all labs ordered are listed, but only abnormal results are displayed) Labs Reviewed  CBC - Abnormal; Notable for the following components:      Result Value   RBC 2.57 (*)    Hemoglobin 7.5 (*)    HCT 25.3 (*)    MCHC 29.6 (*)    RDW 18.0 (*)    All other components within normal limits  COMPREHENSIVE METABOLIC PANEL WITH GFR - Abnormal; Notable for the following components:   Potassium 5.2 (*)    CO2 21 (*)    BUN 60 (*)     Creatinine, Ser 4.91 (*)    Total Protein 6.4 (*)    Albumin  3.1 (*)    GFR, Estimated 9 (*)    All other components within normal limits    EKG: None  Radiology: DG Chest 2 View Result Date: 09/14/2023 CLINICAL DATA:  Shortness of breath. EXAM: CHEST - 2 VIEW COMPARISON:  09/10/2023 FINDINGS: Stable heart size. Stable appearance of right-sided tunneled dialysis catheter. No overt pulmonary edema or pleural  fluid. No pneumothorax. Stable appearance of old left-sided rib fractures. IMPRESSION: No acute findings. Stable appearance of right-sided tunneled dialysis catheter. Electronically Signed   By: Marcey Moan M.D.   On: 09/14/2023 17:18   Procedures   Medications Ordered in the ED  Chlorhexidine  Gluconate Cloth 2 % PADS 6 each (has no administration in time range)  pentafluoroprop-tetrafluoroeth (GEBAUERS) aerosol 1 Application (has no administration in time range)  lidocaine  (PF) (XYLOCAINE ) 1 % injection 5 mL (has no administration in time range)  lidocaine -prilocaine  (EMLA ) cream 1 Application (has no administration in time range)  heparin  injection 1,000 Units (has no administration in time range)  anticoagulant sodium citrate  solution 5 mL (has no administration in time range)  alteplase  (CATHFLO ACTIVASE ) injection 2 mg (has no administration in time range)  heparin  injection 3,000 Units (has no administration in time range)    Clinical Course as of 09/15/23 0017  Fri Sep 14, 2023  2154 Spoke to nephrology, who said that they will get her in for dialysis.  No other recommendations at this time. [CB]    Clinical Course User Index [CB] Beola Terrall RAMAN, PA-C                                Medical Decision Making Amount and/or Complexity of Data Reviewed Labs: ordered. Radiology: ordered.   This patient is a 63 year old female who presents to the ED for concern of requiring hemodialysis, also reporting some mild shortness of breath and bilateral leg swelling which  is her normal when she misses dialysis, missed Wednesday as well as today's dialysis.  Spoke with Dr. Melia with nephrology.   On physical exam, patient is in no acute distress, afebrile, alert and orient x 4, speaking in full sentences, nontachypneic, nontachycardic.  LCTAB, RRR, no murmur.  No abdominal tenderness to palpation.  Lower leg edema present bilaterally with pitting edema 2+ bilaterally.  Unremarkable exam otherwise.  Lab work notes a mild hyperkalemia but otherwise unremarkable.  Called nephrology who reported that she will undergo dialysis tonight. Case discussed with attending who agreed that no cardiac labs are necessary at this time.    Patient awaiting undergo dialysis at this time, no acute distress.  Patient care transferred over to nephrology for her to undergo dialysis.  Likely discharge after undergoing dialysis.  Low suspicion for ACS or heart failure as cause of patient symptoms today.  Differential diagnoses prior to evaluation: The emergent differential diagnosis includes, but is not limited to, fluid overload, ACS, PE, metabolic disturbance, arrhythmia, pneumonia, URI,. This is not an exhaustive differential.   Past Medical History / Co-morbidities / Social History: Physical therapy disorder, hypoxic respiratory failure, MI, ESRD on HD Monday Wednesday Friday, GERD, cocaine use,  Additional history: Chart reviewed. Pertinent results include:   Last seen for hemodialysis on Monday this past week, noted to be traveling to [place]  for which why she is not able to undergo dialysis at her normal chair.  Lab Tests/Imaging studies: I personally interpreted labs/imaging and the pertinent results include:   CBC notes anemia of 7.5, which she has been at previously CMP notes a hyperkalemia 5.2, baseline creatinine and GFR. X-ray shows no acute findings with stable right sided tunneled dialysis catheter  I agree with the radiologist interpretation.  Cardiac  monitoring: EKG obtained and interpreted by myself and attending physician which shows: Normal sinus rhythm   Medications: I have reviewed the patients home medicines  and have made adjustments as needed.  Critical Interventions: None  Social Determinants of Health: Needs hemodialysis with her moving to [place]   Disposition: After consideration of the diagnostic results and the patients response to treatment, I feel that the patient would benefit from  dialysis, with discharge after dialysis if symptoms resolve.  Final diagnoses:  Shortness of breath  End-stage renal disease needing dialysis [facility])    ED Discharge Orders     None          Beola Terrall GORMAN DEVONNA 09/15/23 0018    Armenta Canning, MD 09/24/23 [phone]

## 2023-09-15 MED ORDER — HEPARIN SODIUM (PORCINE) 1000 UNIT/ML IJ SOLN
INTRAMUSCULAR | Status: AC
  Filled 2023-09-15: qty 4

## 2023-09-15 NOTE — ED Notes (Signed)
 Recalled for pt picked

## 2023-09-15 NOTE — ED Provider Notes (Signed)
  5:41 AM Returned from HD, appears well.   VSS.  Appears appropriate for discharge.     Jarold Olam HERO, PA-C 09/15/23 9379    Haze Lonni PARAS, MD 09/16/23 [phone]

## 2023-09-15 NOTE — Discharge Instructions (Signed)
 Continue your usual outpatient dialysis. Follow-up with your doctor. Return here for new concerns.

## 2023-09-15 NOTE — ED Notes (Signed)
 Called for pt pick to ptar.

## 2023-09-17 ENCOUNTER — Other Ambulatory Visit: Payer: Self-pay

## 2023-09-17 ENCOUNTER — Emergency Department (HOSPITAL_COMMUNITY): Admission: EM | Admit: 2023-09-17 | Discharge: 2023-09-17 | Disposition: A | Discharge status: Home / Self Care

## 2023-09-17 ENCOUNTER — Encounter (HOSPITAL_COMMUNITY): Payer: Self-pay

## 2023-09-17 DIAGNOSIS — Z992 Dependence on renal dialysis: Secondary | ICD-10-CM | POA: Insufficient documentation

## 2023-09-17 DIAGNOSIS — Z91010 Allergy to peanuts: Secondary | ICD-10-CM | POA: Diagnosis not present

## 2023-09-17 DIAGNOSIS — H9201 Otalgia, right ear: Secondary | ICD-10-CM | POA: Insufficient documentation

## 2023-09-17 DIAGNOSIS — N186 End stage renal disease: Secondary | ICD-10-CM | POA: Diagnosis not present

## 2023-09-17 DIAGNOSIS — Z79899 Other long term (current) drug therapy: Secondary | ICD-10-CM | POA: Diagnosis not present

## 2023-09-17 LAB — COMPREHENSIVE METABOLIC PANEL WITH GFR
ALT: 8 U/L (ref 0–44)
AST: 14 U/L — ABNORMAL LOW (ref 15–41)
Albumin: 3 g/dL — ABNORMAL LOW (ref 3.5–5.0)
Alkaline Phosphatase: 93 U/L (ref 38–126)
Anion gap: 15 (ref 5–15)
BUN: 37 mg/dL — ABNORMAL HIGH (ref 8–23)
CO2: 21 mmol/L — ABNORMAL LOW (ref 22–32)
Calcium: 8.2 mg/dL — ABNORMAL LOW (ref 8.9–10.3)
Chloride: 104 mmol/L (ref 98–111)
Creatinine, Ser: 4.46 mg/dL — ABNORMAL HIGH (ref 0.44–1.00)
GFR, Estimated: 10 mL/min — ABNORMAL LOW (ref >60)
Glucose, Bld: 100 mg/dL — ABNORMAL HIGH (ref 70–99)
Potassium: 3.1 mmol/L — ABNORMAL LOW (ref 3.5–5.1)
Sodium: 140 mmol/L (ref 135–145)
Total Bilirubin: 0.4 mg/dL (ref 0.0–1.2)
Total Protein: 6.3 g/dL — ABNORMAL LOW (ref 6.5–8.1)

## 2023-09-17 LAB — CBC WITH DIFFERENTIAL/PLATELET
Abs Immature Granulocytes: 0.01 K/uL (ref 0.00–0.07)
Basophils Absolute: 0 K/uL (ref 0.0–0.1)
Basophils Relative: 0 %
Eosinophils Absolute: 0.2 K/uL (ref 0.0–0.5)
Eosinophils Relative: 3 %
HCT: 26.6 % — ABNORMAL LOW (ref 36.0–46.0)
Hemoglobin: 7.8 g/dL — ABNORMAL LOW (ref 12.0–15.0)
Immature Granulocytes: 0 %
Lymphocytes Relative: 12 %
Lymphs Abs: 0.8 K/uL (ref 0.7–4.0)
MCH: 28.9 pg (ref 26.0–34.0)
MCHC: 29.3 g/dL — ABNORMAL LOW (ref 30.0–36.0)
MCV: 98.5 fL (ref 80.0–100.0)
Monocytes Absolute: 0.5 K/uL (ref 0.1–1.0)
Monocytes Relative: 7 %
Neutro Abs: 5.4 K/uL (ref 1.7–7.7)
Neutrophils Relative %: 78 %
Platelets: 213 K/uL (ref 150–400)
RBC: 2.7 MIL/uL — ABNORMAL LOW (ref 3.87–5.11)
RDW: 17.3 % — ABNORMAL HIGH (ref 11.5–15.5)
WBC: 6.9 K/uL (ref 4.0–10.5)
nRBC: 0 % (ref 0.0–0.2)

## 2023-09-17 MED ORDER — HEPARIN SODIUM (PORCINE) 1000 UNIT/ML IJ SOLN
INTRAMUSCULAR | Status: AC
  Filled 2023-09-17: qty 4

## 2023-09-17 MED ORDER — LIDOCAINE-PRILOCAINE 2.5-2.5 % EX CREA: 1.0000 | TOPICAL_CREAM | CUTANEOUS | Status: DC | PRN

## 2023-09-17 MED ORDER — PENTAFLUOROPROP-TETRAFLUOROETH EX AERO: 1.0000 | INHALATION_SPRAY | CUTANEOUS | Status: DC | PRN

## 2023-09-17 MED ORDER — HEPARIN SODIUM (PORCINE) 1000 UNIT/ML DIALYSIS: 1000.0000 [IU] | INTRAMUSCULAR | Status: DC | PRN

## 2023-09-17 MED ORDER — LIDOCAINE HCL (PF) 1 % IJ SOLN: 5.0000 mL | INTRAMUSCULAR | Status: DC | PRN

## 2023-09-17 MED ORDER — HEPARIN SODIUM (PORCINE) 1000 UNIT/ML IJ SOLN
4000.0000 [IU] | Freq: Once | INTRAMUSCULAR | Status: AC
  Administered 2023-09-17: 4000 [IU]

## 2023-09-17 MED ORDER — CHLORHEXIDINE GLUCONATE CLOTH 2 % EX PADS: 6.0000 | MEDICATED_PAD | Freq: Every day | CUTANEOUS | Status: DC

## 2023-09-17 NOTE — ED Notes (Signed)
 Pt denies sob and cp at this time.

## 2023-09-17 NOTE — ED Provider Notes (Signed)
 [place] EMERGENCY DEPARTMENT AT [facility] Provider Note   CSN: [id number] Arrival date & time: 09/17/23  1256     Patient presents with: Needs Dialysis and Ear Pain   Tiffany [name] is a 63 y.o. female.   63 year old female here for evaluation of ear pain.  She also needs dialysis and frequently comes to the ER to be dialyzed.  Denies any other symptoms        Prior to Admission medications   Medication Sig Start Date End Date Taking? Authorizing Provider  amLODipine  (NORVASC ) 5 MG tablet Take 1 tablet (5 mg total) by mouth 2 (two) times daily. 09/06/23   Donnelly Mellow, MD  atorvastatin  (LIPITOR ) 20 MG tablet Take 1 tablet (20 mg total) by mouth daily. 07/07/23 10/05/23  [place], Camellia PARAS, DO  calcitRIOL  (ROCALTROL ) 0.5 MCG capsule Take 1 capsule (0.5 mcg total) by mouth daily. 07/07/23   [place], Camellia PARAS, DO  carvedilol  (COREG ) 12.5 MG tablet Take 1 tablet (12.5 mg total) by mouth 2 (two) times daily. 07/07/23 10/05/23  [place], Camellia PARAS, DO  docusate sodium  (COLACE) 100 MG capsule Take 1 capsule (100 mg total) by mouth daily. 09/07/23   Jadapalle, Sree, MD  DULoxetine  (CYMBALTA ) 30 MG capsule Take 1 capsule (30 mg total) by mouth daily. 09/06/23   Donnelly Mellow, MD  epoetin  alfa (EPOGEN ) 10000 UNIT/ML injection Inject 1 mL (10,000 Units total) into the vein every Monday, Wednesday, and Friday with hemodialysis. 11/22/22   Cam Charlie Loving, DO  escitalopram  (LEXAPRO ) 10 MG tablet Take 1 tablet (10 mg total) by mouth daily. 09/06/23 12/05/23  Jadapalle, Sree, MD  ezetimibe  (ZETIA ) 10 MG tablet Take 1 tablet (10 mg total) by mouth daily. 09/07/23   Donnelly Mellow, MD  ferrous sulfate  325 (65 FE) MG tablet Take 1 tablet (325 mg total) by mouth daily. 07/07/23 07/01/24  [place], Camellia PARAS, DO  folic acid  (FOLVITE ) 1 MG tablet Take 1 tablet (1 mg total) by mouth daily. 07/07/23   [place], Camellia PARAS, DO  Melatonin 10 MG TBDP Take 1 tablet (10 mg) by mouth at bedtime. 09/06/23    Jadapalle, Sree, MD  metolazone  (ZAROXOLYN ) 2.5 MG tablet Take 1 tablet (2.5 mg total) by mouth every Monday, Wednesday, and Friday. 07/09/23 10/07/23  [place], Camellia PARAS, DO  omeprazole  (PRILOSEC) 40 MG capsule Take 1 capsule (40 mg total) by mouth every morning. 07/07/23 10/05/23  [place], Camellia PARAS, DO  pantoprazole  (PROTONIX ) 40 MG tablet Take 1 tablet (40 mg total) by mouth daily. 09/07/23   Donnelly Mellow, MD  potassium chloride  SA (KLOR-CON  M) 20 MEQ tablet Take 1 tablet (20 mEq total) by mouth 2 (two) times daily. 09/06/23   Donnelly Mellow, MD  prazosin  (MINIPRESS ) 2 MG capsule Take 1 capsule (2 mg total) by mouth at bedtime. Patient not taking: Reported on 08/14/2023 02/26/23     torsemide  40 MG TABS Take 40 mg by mouth 2 (two) times daily. 08/04/23   Amponsah, Prosper M, MD    Allergies: Asa [aspirin ], Deltasone  [prednisone ], Latuda [lurasidone hcl], Lortab [hydrocodone -acetaminophen ], Magnesium -containing compounds, Ultram  [tramadol ], Codeine, Desyrel  [trazodone ], Peanut (diagnostic), Apresoline  [hydralazine ], Sulfa antibiotics, Tape, and Topamax  [topiramate ]    Review of Systems  Constitutional:  Negative for chills and fever.  HENT:  Positive for ear pain. Negative for sore throat.   Eyes:  Negative for pain and visual disturbance.  Respiratory:  Negative for cough and shortness of breath.   Cardiovascular:  Negative for chest pain and palpitations.  Gastrointestinal:  Negative for abdominal pain and vomiting.  Genitourinary:  Negative for dysuria and hematuria.  Musculoskeletal:  Negative for arthralgias and back pain.  Skin:  Negative for color change and rash.  Neurological:  Negative for seizures and syncope.  All other systems reviewed and are negative.   Updated Vital Signs BP 117/73 (BP Location: Right Arm)   Pulse 89   Temp 98.9 F (37.2 C)   Resp 18   Ht 5' 3 (1.6 m)   Wt 85.8 kg   SpO2 99%   BMI 33.51 kg/m   Physical Exam Vitals and nursing note reviewed.   Constitutional:      General: [name] not in acute distress.    Appearance: Normal appearance. [name] well-developed.  HENT:     Head: Normocephalic and atraumatic.     Ears:     Comments: Left TM is obscured by wax, right TM appears normal, canal is normal Eyes:     Conjunctiva/sclera: Conjunctivae normal.  Cardiovascular:     Rate and Rhythm: Normal rate and regular rhythm.     Heart sounds: No murmur heard. Pulmonary:     Effort: Pulmonary effort is normal. No respiratory distress.     Breath sounds: Normal breath sounds.  Abdominal:     Palpations: Abdomen is soft.     Tenderness: There is no abdominal tenderness.  Musculoskeletal:        General: No swelling.     Cervical back: Neck supple.  Skin:    General: Skin is warm and dry.     Capillary Refill: Capillary refill takes less than 2 seconds.  Neurological:     Mental Status: [name] alert.  Psychiatric:        Mood and Affect: Mood normal.     (all labs ordered are listed, but only abnormal results are displayed) Labs Reviewed  CBC WITH DIFFERENTIAL/PLATELET - Abnormal; Notable for the following components:      Result Value   RBC 2.70 (*)    Hemoglobin 7.8 (*)    HCT 26.6 (*)    MCHC 29.3 (*)    RDW 17.3 (*)    All other components within normal limits  COMPREHENSIVE METABOLIC PANEL WITH GFR - Abnormal; Notable for the following components:   Potassium 3.1 (*)    CO2 21 (*)    Glucose, Bld 100 (*)    BUN 37 (*)    Creatinine, Ser 4.46 (*)    Calcium  8.2 (*)    Total Protein 6.3 (*)    Albumin  3.0 (*)    AST 14 (*)    GFR, Estimated 10 (*)    All other components within normal limits    EKG: EKG Interpretation Date/Time:  Monday September 17 2023 14:05:10 EDT Ventricular Rate:  85 PR Interval:  140 QRS Duration:  84 QT Interval:  394 QTC Calculation: 469 R Axis:   9  Text Interpretation: Sinus rhythm Low voltage, precordial leads Compared with prior EKG from 09/14/2023 Confirmed by Gennaro Bouchard ([id number]) on 09/17/2023 2:07:08 PM  Radiology: No results found.   Procedures   Medications Ordered in the ED  Chlorhexidine  Gluconate Cloth 2 % PADS 6 each (has no administration in time range)                                    Medical Decision Making Social determinants of health: Patient is  currently moving and has no dialysis chair  Patient with creatinine is 8.  Nephrology consulted and they will review her chart and may be able to lites for later today.  Patient is ears are unremarkable she has been complaining of some congestion as well.  Advised Zyrtec  or Claritin  at home as outpatient.  Problems Addressed: End stage renal disease on dialysis [facility]): chronic illness or injury Otalgia of right ear: self-limited or minor problem  Amount and/or Complexity of Data Reviewed External Data Reviewed: notes.    Details: Prior ED records reviewed and patient came here for dialysis twice last week Labs: ordered. Decision-making details documented in ED Course.    Details: Ordered and reviewed by me and shows baseline creatinine, otherwise fairly unremarkable Discussion of management or test interpretation with external provider(s): Nephrology on-call-I spoke with the on-call provider and he will review her chart and they will try to get her dialyzed today  Risk OTC drugs. Prescription drug management. Diagnosis or treatment significantly limited by social determinants of health.    Final diagnoses:  Otalgia of right ear  End stage renal disease on dialysis [facility])    ED Discharge Orders     None          Gennaro Duwaine CROME, DO 09/17/23 1432

## 2023-09-17 NOTE — ED Notes (Signed)
 NT assisted pt to and from the bathroom also helped pt get in and out of bed

## 2023-09-17 NOTE — ED Triage Notes (Signed)
 Pt here for dialysis. Pt last dialysis was Friday. Pt also c.o right ear pain

## 2023-09-17 NOTE — ED Provider Notes (Signed)
 Pt finished up her dialysis and is ready to go home.  She has no c/o at this time.   Doretha Folks, MD 09/17/23 2046

## 2023-09-17 NOTE — Progress Notes (Incomplete)
 Asked to see this patient for hospital dialysis. Pt is w/o a local HD unit currently and is trying to get accepted at a unit in [place]  where she is planning to move. Today she states that her plan got moved back and is wondering if we can get her into a HD [place] for the time being. I told her we can check into that but not today as this is a holiday.  On exam, lungs clear, on RA, no ^wob, no abd tenderness, bilat LE edema 1-2+. The plan will be for ED HD. Pt is not to be admitted at this time. Pt will go to the dialysis unit when they are ready for the patient. When dialysis is completed pt will be sent back to ED for reassessment.   Vitals:   09/17/23 1301 09/17/23 1302  BP: 117/73   Pulse: 89   Resp: 18   Temp: 98.9 F (37.2 C)   SpO2: 99%   Weight:  85.8 kg  Height:  5' 3 (1.6 m)     Recent Labs  Lab 09/10/23 1454 09/14/23 1649 09/17/23 1320  HGB 7.5* 7.5* 7.8*  ALBUMIN   --  3.1*  --   CALCIUM  9.3 8.9  --   CREATININE 4.78* 4.91*  --   K 3.8 5.2*  --    No results for input(s): IRON , TIBC, FERRITIN in the last 168 hours. Inpatient medications:  I was present at the procedure, reviewed the HD regimen and made appropriate changes.   Myer Fret MD  [facility] 09/17/2023, 2:21 PM

## 2023-09-17 NOTE — ED Notes (Signed)
 Pt transported to dialysis

## 2023-09-17 NOTE — ED Triage Notes (Signed)
 Pt bib POV with mother c/o ear ache and HD (MWF)  Pt states her right ear started aching Friday and today it is worse. Pt denies fever, chills, N/V  Pt needs her dialysis tx

## 2023-09-18 ENCOUNTER — Encounter: Payer: Self-pay | Admitting: Acute Care

## 2023-09-18 NOTE — Progress Notes (Signed)
 Asked to see this patient for hospital dialysis. The plan will be for ED HD. Pt is not to be admitted at this time. Pt will go to the dialysis unit when they are ready for the patient. When dialysis is completed pt will be sent back to ED for reassessment.   Vitals:   09/17/23 1900 09/17/23 2000 09/17/23 2038 09/17/23 2102  BP:  105/85 125/84   Pulse: 85 81 86   Resp: 20 18 16    Temp:    98.6 F (37 C)  TempSrc:    Oral  SpO2: 100% 100% 100%   Weight:      Height:           Myer Fret  MD  [facility] 09/17/2023, 2:45 PM  Recent Labs  Lab 09/14/23 1649 09/17/23 1320  HGB 7.5* 7.8*  ALBUMIN  3.1* 3.0*  CALCIUM  8.9 8.2*  CREATININE 4.91* 4.46*  K 5.2* 3.1*    Inpatient medications:

## 2023-09-18 NOTE — Progress Notes (Signed)
 Late Note Entry- Sept 2, 2025  Contacted by nephrologist this morning regarding pt's request for a transfer to another HD clinic. Pt made this request when pt was seen in the ED yesterday. Contacted [facility] clinic staff to see if staff would be able to assist pt with this matter/request. Will await a response.   Randine Mungo Dialysis Navigator [phone]

## 2023-09-20 ENCOUNTER — Other Ambulatory Visit (HOSPITAL_COMMUNITY): Payer: Self-pay

## 2023-09-21 ENCOUNTER — Emergency Department (HOSPITAL_COMMUNITY)
Admission: EM | Admit: 2023-09-21 | Discharge: 2023-09-22 | Disposition: A | Discharge status: Home / Self Care | Attending: Emergency Medicine | Admitting: Emergency Medicine

## 2023-09-21 DIAGNOSIS — N186 End stage renal disease: Secondary | ICD-10-CM | POA: Diagnosis not present

## 2023-09-21 DIAGNOSIS — Z79899 Other long term (current) drug therapy: Secondary | ICD-10-CM | POA: Diagnosis not present

## 2023-09-21 DIAGNOSIS — I132 Hypertensive heart and chronic kidney disease with heart failure and with stage 5 chronic kidney disease, or end stage renal disease: Secondary | ICD-10-CM | POA: Diagnosis not present

## 2023-09-21 DIAGNOSIS — Z992 Dependence on renal dialysis: Secondary | ICD-10-CM | POA: Insufficient documentation

## 2023-09-21 DIAGNOSIS — Z91010 Allergy to peanuts: Secondary | ICD-10-CM | POA: Diagnosis not present

## 2023-09-21 DIAGNOSIS — E1122 Type 2 diabetes mellitus with diabetic chronic kidney disease: Secondary | ICD-10-CM | POA: Insufficient documentation

## 2023-09-21 DIAGNOSIS — I5030 Unspecified diastolic (congestive) heart failure: Secondary | ICD-10-CM | POA: Diagnosis not present

## 2023-09-21 DIAGNOSIS — J449 Chronic obstructive pulmonary disease, unspecified: Secondary | ICD-10-CM | POA: Diagnosis not present

## 2023-09-21 NOTE — ED Triage Notes (Signed)
 Pt states she is here to get HD. Last treatment on Monday.

## 2023-09-22 LAB — RENAL FUNCTION PANEL
Albumin: 3 g/dL — ABNORMAL LOW (ref 3.5–5.0)
Anion gap: 14 (ref 5–15)
BUN: 58 mg/dL — ABNORMAL HIGH (ref 8–23)
CO2: 20 mmol/L — ABNORMAL LOW (ref 22–32)
Calcium: 8.5 mg/dL — ABNORMAL LOW (ref 8.9–10.3)
Chloride: 104 mmol/L (ref 98–111)
Creatinine, Ser: 5.4 mg/dL — ABNORMAL HIGH (ref 0.44–1.00)
GFR, Estimated: 8 mL/min — ABNORMAL LOW (ref >60)
Glucose, Bld: 83 mg/dL (ref 70–99)
Phosphorus: 6.7 mg/dL — ABNORMAL HIGH (ref 2.5–4.6)
Potassium: 4.1 mmol/L (ref 3.5–5.1)
Sodium: 138 mmol/L (ref 135–145)

## 2023-09-22 LAB — CBC
HCT: 23.4 % — ABNORMAL LOW (ref 36.0–46.0)
Hemoglobin: 7 g/dL — ABNORMAL LOW (ref 12.0–15.0)
MCH: 28.6 pg (ref 26.0–34.0)
MCHC: 29.9 g/dL — ABNORMAL LOW (ref 30.0–36.0)
MCV: 95.5 fL (ref 80.0–100.0)
Platelets: 264 K/uL (ref 150–400)
RBC: 2.45 MIL/uL — ABNORMAL LOW (ref 3.87–5.11)
RDW: 16.6 % — ABNORMAL HIGH (ref 11.5–15.5)
WBC: 5.9 K/uL (ref 4.0–10.5)
nRBC: 0 % (ref 0.0–0.2)

## 2023-09-22 LAB — IRON AND TIBC
Iron: 81 ug/dL (ref 28–170)
Saturation Ratios: 33 % — ABNORMAL HIGH (ref 10.4–31.8)
TIBC: 249 ug/dL — ABNORMAL LOW (ref 250–450)
UIBC: 168 ug/dL

## 2023-09-22 LAB — FERRITIN: Ferritin: 238 ng/mL (ref 11–307)

## 2023-09-22 LAB — HEPATITIS B SURFACE ANTIGEN: Hepatitis B Surface Ag: NONREACTIVE

## 2023-09-22 MED ORDER — HEPARIN SODIUM (PORCINE) 1000 UNIT/ML DIALYSIS: 1000.0000 [IU] | INTRAMUSCULAR | Status: DC | PRN

## 2023-09-22 MED ORDER — ALTEPLASE 2 MG IJ SOLR: 2.0000 mg | Freq: Once | INTRAMUSCULAR | Status: DC | PRN

## 2023-09-22 MED ORDER — LIDOCAINE-PRILOCAINE 2.5-2.5 % EX CREA: 1.0000 | TOPICAL_CREAM | CUTANEOUS | Status: DC | PRN

## 2023-09-22 MED ORDER — ANTICOAGULANT SODIUM CITRATE 4% (200MG/5ML) IV SOLN: 5.0000 mL | Status: DC | PRN

## 2023-09-22 MED ORDER — HEPARIN SODIUM (PORCINE) 1000 UNIT/ML IJ SOLN
3200.0000 [IU] | Freq: Once | INTRAMUSCULAR | Status: AC
  Administered 2023-09-22: 3200 [IU]

## 2023-09-22 MED ORDER — HEPARIN SODIUM (PORCINE) 1000 UNIT/ML IJ SOLN
INTRAMUSCULAR | Status: AC
  Filled 2023-09-22: qty 4

## 2023-09-22 MED ORDER — LIDOCAINE HCL (PF) 1 % IJ SOLN: 5.0000 mL | INTRAMUSCULAR | Status: DC | PRN

## 2023-09-22 MED ORDER — CHLORHEXIDINE GLUCONATE CLOTH 2 % EX PADS: 6.0000 | MEDICATED_PAD | Freq: Every day | CUTANEOUS | Status: DC

## 2023-09-22 MED ORDER — PENTAFLUOROPROP-TETRAFLUOROETH EX AERO: 1.0000 | INHALATION_SPRAY | CUTANEOUS | Status: DC | PRN

## 2023-09-22 NOTE — Progress Notes (Signed)
 Informed of ESRD patient in ER. Presents for routine HD. Apparently relocating to [place]  therefore doesn't have a HD chair in the area. This is odd given that she is still on the census at [facility].  Will contact [facility] later today to figure out what's going on. HD ordered for 1st shift. Anticipate DC from ER post HD. Will see her later today.  Ephriam Stank, MD [facility]

## 2023-09-22 NOTE — ED Provider Notes (Signed)
 [place] EMERGENCY DEPARTMENT AT [facility] Provider Note   CSN: [id number] Arrival date & time: 09/21/23  2103     Patient presents with: No chief complaint on file.   Tiffany [name] is a 63 y.o. female.   63 y/o female with hx of HLD, ESRD on dialysis, dCHF, DM, HTN, COPD, and manic depression presents to the ED requesting dialysis. Last dialyzed on Monday. Frequently comes to the ED to be dialyzed. No acute complaints today. Denies CP, SOB, N/V.   The history is provided by the patient. No language interpreter was used.       Prior to Admission medications   Medication Sig Start Date End Date Taking? Authorizing Provider  amLODipine  (NORVASC ) 5 MG tablet Take 1 tablet (5 mg total) by mouth 2 (two) times daily. 09/06/23   Donnelly Mellow, MD  atorvastatin  (LIPITOR ) 20 MG tablet Take 1 tablet (20 mg total) by mouth daily. 07/07/23 10/05/23  [place], Camellia PARAS, DO  calcitRIOL  (ROCALTROL ) 0.5 MCG capsule Take 1 capsule (0.5 mcg total) by mouth daily. 07/07/23   [place], Camellia PARAS, DO  carvedilol  (COREG ) 12.5 MG tablet Take 1 tablet (12.5 mg total) by mouth 2 (two) times daily. 07/07/23 10/05/23  [place], Camellia PARAS, DO  docusate sodium  (COLACE) 100 MG capsule Take 1 capsule (100 mg total) by mouth daily. 09/07/23   Jadapalle, Sree, MD  DULoxetine  (CYMBALTA ) 30 MG capsule Take 1 capsule (30 mg total) by mouth daily. 09/06/23   Donnelly Mellow, MD  epoetin  alfa (EPOGEN ) 10000 UNIT/ML injection Inject 1 mL (10,000 Units total) into the vein every Monday, Wednesday, and Friday with hemodialysis. 11/22/22   Cam Charlie Loving, DO  escitalopram  (LEXAPRO ) 10 MG tablet Take 1 tablet (10 mg total) by mouth daily. 09/06/23 12/05/23  Jadapalle, Sree, MD  ezetimibe  (ZETIA ) 10 MG tablet Take 1 tablet (10 mg total) by mouth daily. 09/07/23   Jadapalle, Sree, MD  ferrous sulfate  325 (65 FE) MG tablet Take 1 tablet (325 mg total) by mouth daily. 07/07/23 07/01/24  [place], Eric J, DO  folic acid  (FOLVITE )  1 MG tablet Take 1 tablet (1 mg total) by mouth daily. 07/07/23   [place], Camellia PARAS, DO  Melatonin 10 MG TBDP Take 1 tablet (10 mg) by mouth at bedtime. 09/06/23   Donnelly Mellow, MD  metolazone  (ZAROXOLYN ) 2.5 MG tablet Take 1 tablet (2.5 mg total) by mouth every Monday, Wednesday, and Friday. 07/09/23 10/07/23  [place], Camellia PARAS, DO  omeprazole  (PRILOSEC) 40 MG capsule Take 1 capsule (40 mg total) by mouth every morning. 07/07/23 10/05/23  [place], Camellia PARAS, DO  pantoprazole  (PROTONIX ) 40 MG tablet Take 1 tablet (40 mg total) by mouth daily. 09/07/23   Donnelly Mellow, MD  potassium chloride  SA (KLOR-CON  M) 20 MEQ tablet Take 1 tablet (20 mEq total) by mouth 2 (two) times daily. 09/06/23   Donnelly Mellow, MD  prazosin  (MINIPRESS ) 2 MG capsule Take 1 capsule (2 mg total) by mouth at bedtime. Patient not taking: Reported on 08/14/2023 02/26/23     torsemide  40 MG TABS Take 40 mg by mouth 2 (two) times daily. 08/04/23   Lou Claretta CHRISTELLA, MD    Allergies: Asa [aspirin ], Deltasone  [prednisone ], Latuda [lurasidone hcl], Lortab [hydrocodone -acetaminophen ], Magnesium -containing compounds, Ultram  [tramadol ], Codeine, Desyrel  [trazodone ], Peanut (diagnostic), Apresoline  [hydralazine ], Sulfa antibiotics, Tape, and Topamax  [topiramate ]    Review of Systems Ten systems reviewed and are negative for acute change, except as noted in the HPI.    Updated Vital Signs  BP 121/82   Pulse 86   Temp 98 F (36.7 C) (Oral)   Resp 14   SpO2 96%   Physical Exam Vitals and nursing note reviewed.  Constitutional:      General: [name] not in acute distress.    Appearance: [name] well-developed. [name] not diaphoretic.  HENT:     Head: Normocephalic and atraumatic.  Eyes:     General: No scleral icterus.    Conjunctiva/sclera: Conjunctivae normal.  Pulmonary:     Effort: Pulmonary effort is normal. No respiratory distress.  Musculoskeletal:        General: Normal range of motion.     Cervical back: Normal range of  motion.  Skin:    General: Skin is warm and dry.     Coloration: Skin is not pale.     Findings: No erythema or rash.  Neurological:     Mental Status: [name] alert and oriented to person, place, and time.  Psychiatric:        Behavior: Behavior normal.     (all labs ordered are listed, but only abnormal results are displayed) Labs Reviewed  HEPATITIS B SURFACE ANTIGEN  HEPATITIS B SURFACE ANTIBODY, QUANTITATIVE    EKG: None  Radiology: No results found.   Procedures   Medications Ordered in the ED  Chlorhexidine  Gluconate Cloth 2 % PADS 6 each (has no administration in time range)    Clinical Course as of 09/22/23 0625  Sat Sep 22, 2023  0415 Nephrology, Dr. Dennise, notified via Epic secure chat of patient's rooming in the ED. [KH]  0451 Per chart review, Patient is dialysis Monday Wednesday Friday. [name] in the process of relocating to [place]  and no longer has dialysis chair in the county. [KH]  9462 Dr. Dennise from Nephrology confirmed awareness of patient's presentation to the department. [KH]    Clinical Course User Index [KH] Keith Sor, PA-C                                 Medical Decision Making  This patient presents to the ED for concern of needing dialysis, this involves an extensive number of treatment options, and is a complaint that carries with it a high risk of complications and morbidity.  The differential diagnosis includes electrolyte derangements vs encephalopathy vs uremia    Co morbidities that complicate the patient evaluation  ESRD dCHF HTN COPD   Cardiac Monitoring:  The patient was maintained on a cardiac monitor.  I personally viewed and interpreted the cardiac monitored which showed an underlying rhythm of: NSR   Medicines ordered and prescription drug management:  I have reviewed the patients home medicines and have made adjustments as needed   Test Considered:  Renal function panel   Consultations Obtained:  I  requested consultation with Dr. Dennise of Nephrology and discussed pertinent plan - they will work on coordinating dialysis in the hospital today.   Problem List / ED Course:  As above   Reevaluation:  After the interventions noted above, I reevaluated the patient and found that they have :stayed the same   Social Determinants of Health:  Housing instability   Dispostion:  Care signed out to [place], [place] pending transfer to dialysis      Final diagnoses:  ESRD needing dialysis [facility])    ED Discharge Orders     None  Keith Sor, PA-C 09/22/23 9370    Raford Lenis, MD 09/22/23 [phone]

## 2023-09-22 NOTE — Discharge Instructions (Signed)
 You will need to follow-up with your primary care provider.  Seek emergency care if experiencing any new or worsening symptoms.

## 2023-09-22 NOTE — ED Provider Notes (Signed)
   Accepted handoff at shift change from [name] PA-C. Please see prior provider note for more detail.   Briefly: Patient is 63 y.o. presents to the ED requesting dialysis. Last dialyzed on Monday. Frequently comes to the ED to be dialyzed. No acute complaints today. Denies CP, SOB, N/V.  Plan:  - patient tolerated dialysis well. Continues to be without acute complaints and is ready to go home. States that her ride will be picking her up at [facility]. - patient afebrile with stable vitals. Provided with return precautions. Discharged in good condition.       Hoy Nidia FALCON, [place] 09/22/23 1351    Patsey Lot, MD 09/22/23 1537

## 2023-09-22 NOTE — Progress Notes (Signed)
 Received patient in bed to unit.  Alert and oriented.  Informed consent signed and in chart.   TX duration: 4 hours  Patient tolerated well.  Transported back to the room  Alert, without acute distress.  Hand-off given to patient's nurse.   Access used: R HD internal jugular Cath Access issues: BFR to 250 at the end of session due to high venous pressures.  A-V and V-A  Also did a dressing change.  Total UF removed: 2L Medication(s) given: 100cc NS bolus to help with cramps   09/22/23 1251  Vitals  Temp (!) 97.2 F (36.2 C)  Temp Source Oral  BP (!) 134/106  MAP (mmHg) 115  Pulse Rate 82  ECG Heart Rate 82  Resp 19  Weight 86.9 kg  Type of Weight Post-Dialysis  Oxygen  Therapy  SpO2 100 %  O2 Device Room Air  During Treatment Monitoring  Duration of HD Treatment -hour(s) 4 hour(s)  HD Safety Checks Performed Yes  Intra-Hemodialysis Comments Tx completed  Post Treatment  Dialyzer Clearance Lightly streaked  Liters Processed 73  Fluid Removed (mL) 2000 mL  Tolerated HD Treatment No (Comment)  Post-Hemodialysis Comments Patient experienced cramps during session.  And high venous pressures had BFR at 250 near the end  Hemodialysis Catheter Right Subclavian Double lumen Permanent (Tunneled)  Placement Date/Time: 10/16/22 (c)    Placed prior to admission: Yes  Orientation: Right  Access Location: Subclavian  Hemodialysis Catheter Type: Double lumen Permanent (Tunneled)  Site Condition No complications  Blue Lumen Status Flushed;Antimicrobial dead end cap;Dead end cap in place  Red Lumen Status Flushed;Antimicrobial dead end cap;Heparin  locked  Purple Lumen Status N/A  Catheter fill solution Heparin  1000 units/ml  Catheter fill volume (Arterial) 1.6 cc  Catheter fill volume (Venous) 1.6  Dressing Type Transparent  Dressing Status Antimicrobial disc/dressing in place;Clean, Dry, Intact  Drainage Description None  Dressing Change Due 09/29/23  Post treatment catheter  status Capped and Clamped     Camellia Brasil LPN [facility]

## 2023-09-22 NOTE — Consult Note (Signed)
 ESRD Consult Note  Requesting provider: Burnard Light PA-C  Reason for consult: ESRD, provision of dialysis  Assessment/Recommendations:  ESRD -outpatient HD unit: [place] -HD today then possible discharge from ER post HD -noncompliant--had a lengthy conversation about dialysis with her. Understandably, she does not want to go back to [facility] since she doesn't live close to there. I did inform her that coming to the ER for dialysis is not sustainable and puts a strain on resources not only for the hospital but for her as well which she is agreement with. Obviously, not much we can do about this over the weekend and I do not think there are any other indications for admission at this time other than dispo planning. I'll send a message to our inpatient renal navigator to see if there is something that can be done. Otherwise, will anticipate that she will be back for HD again on Monday or Tuesday  Volume/ hypertension  -UF as tolerated  Anemia of Chronic Kidney Disease -Hemoglobin 7.8 on 9/1. Will check Fe panel  -Transfuse PRN for Hgb <7  Secondary Hyperparathyroidism/Hyperphosphatemia - resume home binders, will check PTH, check po4  Discussed with ER provider.  Ephriam Stank, MD [facility]  History of Present Illness: Tiffany [name] is a/an 63 y.o. female with a past medical history of ESRD, schizoaffective disorder, MDD/PTSD, polysubstance use, hypertension, CHF, DM 2 COPD, history of CVA who presents for needing dialysis.  Lately, she has been coming here to the ER for provision of dialysis.  Apparently she is moving to [place]  and does not have a dialysis unit in the county.  This is not true. I spoke to [facility] staff this morning, she is still on the census, she HAS NOT been discharged from her home dialysis unit. She just refuses to come at her set time. Patient seen and examined bedside in ER. Patient reports feeling more swollen.  She reports that she refuses to  go back to [facility] given that she has moved to [employer], apparently she and transferred to another unit.  [name] inquiring about the new [facility] unit.  Denies any chest pain, shortness of breath, issues with her [facility] She says she is moving to [place]  but upon further questioning, there is no plan/date.   Medications:  Current Facility-Administered Medications  Medication Dose Route Frequency Provider Last Rate Last Admin   alteplase  (CATHFLO ACTIVASE ) injection 2 mg  2 mg Intracatheter Once PRN Yitta Gongaware, MD       alteplase  (CATHFLO ACTIVASE ) injection 2 mg  2 mg Intracatheter Once PRN Druscilla Bald, NP       alteplase  (CATHFLO ACTIVASE ) injection 2 mg  2 mg Intracatheter Once PRN Druscilla Bald, NP       alteplase  (CATHFLO ACTIVASE ) injection 2 mg  2 mg Intracatheter Once PRN Druscilla Bald, NP       alteplase  (CATHFLO ACTIVASE ) injection 2 mg  2 mg Intracatheter Once PRN Geralynn Charleston, MD       anticoagulant sodium citrate  solution 5 mL  5 mL Intracatheter PRN Stank Ephriam, MD       anticoagulant sodium citrate  solution 5 mL  5 mL Intracatheter PRN Geralynn Charleston, MD       Chlorhexidine  Gluconate Cloth 2 % PADS 6 each  6 each Topical Q0600 Stank Ephriam, MD       heparin  injection 1,000 Units  1,000 Units Intracatheter PRN Stank Ephriam, MD  heparin  injection 1,000 Units  1,000 Units Intracatheter PRN Druscilla Bald, NP       heparin  injection 1,000 Units  1,000 Units Intracatheter PRN Druscilla Bald, NP       heparin  injection 1,000 Units  1,000 Units Intracatheter PRN Druscilla Bald, NP       lidocaine  (PF) (XYLOCAINE ) 1 % injection 5 mL  5 mL Intradermal PRN Dennise Hoes, MD       lidocaine -prilocaine  (EMLA ) cream 1 Application  1 Application Topical PRN Lashaunda Schild, MD       pentafluoroprop-tetrafluoroeth (GEBAUERS) aerosol 1 Application  1 Application Topical PRN Dennise Hoes, MD       Current Outpatient Medications   Medication Sig Dispense Refill   amLODipine  (NORVASC ) 5 MG tablet Take 1 tablet (5 mg total) by mouth 2 (two) times daily. 30 tablet 0   atorvastatin  (LIPITOR ) 20 MG tablet Take 1 tablet (20 mg total) by mouth daily. 90 tablet 0   calcitRIOL  (ROCALTROL ) 0.5 MCG capsule Take 1 capsule (0.5 mcg total) by mouth daily. 90 capsule 0   carvedilol  (COREG ) 12.5 MG tablet Take 1 tablet (12.5 mg total) by mouth 2 (two) times daily. 180 tablet 0   docusate sodium  (COLACE) 100 MG capsule Take 1 capsule (100 mg total) by mouth daily. 10 capsule 0   DULoxetine  (CYMBALTA ) 30 MG capsule Take 1 capsule (30 mg total) by mouth daily. 30 capsule 0   epoetin  alfa (EPOGEN ) 10000 UNIT/ML injection Inject 1 mL (10,000 Units total) into the vein every Monday, Wednesday, and Friday with hemodialysis. 1 mL 3   escitalopram  (LEXAPRO ) 10 MG tablet Take 1 tablet (10 mg total) by mouth daily. 90 tablet 0   ezetimibe  (ZETIA ) 10 MG tablet Take 1 tablet (10 mg total) by mouth daily. 30 tablet 0   ferrous sulfate  325 (65 FE) MG tablet Take 1 tablet (325 mg total) by mouth daily. 90 tablet 3   folic acid  (FOLVITE ) 1 MG tablet Take 1 tablet (1 mg total) by mouth daily. 90 tablet 0   Melatonin 10 MG TBDP Take 1 tablet (10 mg) by mouth at bedtime. 30 tablet 0   metolazone  (ZAROXOLYN ) 2.5 MG tablet Take 1 tablet (2.5 mg total) by mouth every Monday, Wednesday, and Friday. 90 tablet 0   omeprazole  (PRILOSEC) 40 MG capsule Take 1 capsule (40 mg total) by mouth every morning. 90 capsule 0   pantoprazole  (PROTONIX ) 40 MG tablet Take 1 tablet (40 mg total) by mouth daily. 30 tablet 0   potassium chloride  SA (KLOR-CON  M) 20 MEQ tablet Take 1 tablet (20 mEq total) by mouth 2 (two) times daily. 60 tablet 0   prazosin  (MINIPRESS ) 2 MG capsule Take 1 capsule (2 mg total) by mouth at bedtime. (Patient not taking: Reported on 08/14/2023) 30 capsule 5   torsemide  40 MG TABS Take 40 mg by mouth 2 (two) times daily. 60 tablet 3     ALLERGIES Asa  [aspirin ], Deltasone  [prednisone ], Latuda [lurasidone hcl], Lortab [hydrocodone -acetaminophen ], Magnesium -containing compounds, Ultram  [tramadol ], Codeine, Desyrel  [trazodone ], Peanut (diagnostic), Apresoline  [hydralazine ], Sulfa antibiotics, Tape, and Topamax  [topiramate ]  MEDICAL HISTORY Past Medical History:  Diagnosis Date   Abdominal pain    Acute encephalopathy 11/21/2017   Acute exacerbation of CHF (congestive heart failure) (HCC) 03/03/2022   Acute GI bleeding 03/29/2021   Acute metabolic encephalopathy 02/20/2020   Aggressive behavior    Agitation 11/22/2017   Anoxic brain injury (HCC) 09/08/2016   C. Arrest due to respiratory failure and COPD exacerbation  Anxiety    Arthritis    all over (04/10/2016)   Asthma 10/18/2010   Binge eating disorder    Blood loss anemia 04/24/2011   CAP (community acquired pneumonia) 06/22/2015   Cardiac arrest (HCC) 09/08/2016   PEA   Carotid artery stenosis    1-39% bilateral by dopplers 11/2016   Chronic diastolic (congestive) heart failure (HCC)    Chronic pain syndrome 06/18/2012   Chronic post-traumatic stress disorder (PTSD) 05/27/2018   Chronic respiratory failure with hypoxia and hypercapnia (HCC) 06/22/2015   TRILOGY Vent >AVAPA-ES., Vt target 200-400, Max P 30 , PS max 20 , PS min 6-10 , E Max 6, E Min 4, Rate Auto AVAPS Rate 2 (titrate for pt comfort) , bleed O2 at 5l/m continuous flow .    Closed displaced fracture of fifth metacarpal bone 03/21/2018   Cocaine use disorder, severe, in sustained remission (HCC) 12/17/2015   Complication of anesthesia    decreased bp, decreased heart rate   COPD (chronic obstructive pulmonary disease) (HCC) 07/08/2014   Delusional disorder, persecutory type (HCC) 06/26/2021   Depression    Diabetic neuropathy (HCC) 04/24/2011   Difficulty with speech 01/24/2018   Disorder of nervous system    Drug abuse (HCC) 11/21/2017   Dyslipidemia 04/24/2011   E. coli UTI 02/20/2020   Elevated troponin  04/28/2012   Emphysema    Encephalopathy 11/21/2017   Essential hypertension 03/22/2016   Facet arthropathy, lumbar 06/12/2022   Fibula fracture 07/10/2016   Frequent falls 10/11/2017   GERD (gastroesophageal reflux disease)    GI bleed 03/30/2021   Gout 04/11/2017   Heart attack (HCC) 1980s   Hematochezia    History of blood transfusion 1994   couldn't stop bleeding from my period   History of drug abuse in remission (HCC) 11/28/2015   Quit in 2017   Hyperlipidemia 03/31/2021   Hyperlipidemia LDL goal <70    Hypocalcemia    Hypokalemia    Hypomagnesemia    Incontinence    Manic depression (HCC)    Morbid obesity (HCC) 10/18/2010   Obstructive sleep apnea 10/18/2010   On home oxygen  therapy    6L; 24/7 (04/10/2016)   OSA on CPAP    wear mask sometimes (04/10/2016)   Painless rectal bleeding 12/31/2015   Paranoid (HCC)    sometimes; I'm on RX for it (04/10/2016)   Periumbilical abdominal pain    Prolonged Q-T interval on ECG    Rectal bleeding 12/31/2015   Recurrent syncope 07/09/2021   Rhabdomyolysis 06/16/2021   Schizoaffective disorder, bipolar type (HCC) 04/05/2018   Seasonal allergies    Seborrheic keratoses 12/31/2013   Seizures (HCC)    don't know what kind; last one was ~ 1 yr ago (04/10/2016)   Sinus bradycardia    Skin ulcer of sacrum, limited to breakdown of skin (HCC) 03/08/2022   Spondylolisthesis at L3-L4 level 06/12/2022   Stroke (HCC) 1980s   denies residual on 04/10/2016   Thrush 09/19/2013   Type 2 diabetes mellitus (HCC) 10/18/2010     SOCIAL HISTORY Social History   Socioeconomic History   Marital status: Widowed    Spouse name: Not on file   Number of children: 3   Years of education: 12th   Highest education level: 12th grade  Occupational History   Occupation: disabled    Comment: factory production  Tobacco Use   Smoking status: Former    Current packs/day: 0.00    Average packs/day: 1.5 packs/day for 39.1 years (58.6 ttl  pk-yrs)    Types: Cigarettes    Start date: 03/13/1977    Quit date: 04/10/2016    Years since quitting: 7.4    Passive exposure: Past   Smokeless tobacco: Never  Vaping Use   Vaping status: Never Used  Substance and Sexual Activity   Alcohol use: No    Alcohol/week: 0.0 standard drinks of alcohol   Drug use: Not Currently    Types: Cocaine    Comment: 04/10/2016 last used cocaine back in November 2017   Sexual activity: Not Currently    Birth control/protection: Surgical  Other Topics Concern   Not on file  Social History Narrative   Has 1 son, [name]   Lives with son and his boyfriend   Her house has ramps and handrails should she ever needs them.    Her mother lives down the street from her and is a good support person in addition to her son.   She drives herself, has private transportation.    Cocaine free since 02/24/16, smoke free since 04/10/16   Dialysis M-W-F   Social Drivers of Health   Financial Resource Strain: Low Risk  (07/11/2022)   Overall Financial Resource Strain (CARDIA)    Difficulty of Paying Living Expenses: Not very hard  Food Insecurity: Food Insecurity Present (08/15/2023)   Hunger Vital Sign    Worried About Running Out of Food in the Last Year: Sometimes true    Ran Out of Food in the Last Year: Sometimes true  Transportation Needs: No Transportation Needs (08/15/2023)   PRAPARE - Administrator, Civil Service (Medical): No    Lack of Transportation (Non-Medical): No  Physical Activity: Inactive (07/11/2022)   Exercise Vital Sign    Days of Exercise per Week: 0 days    Minutes of Exercise per Session: 0 min  Stress: Stress Concern Present (07/11/2022)   [employer] of Occupational Health - Occupational Stress Questionnaire    Feeling of Stress : To some extent  Social Connections: Socially Isolated (06/28/2023)   Social Connection and Isolation Panel    Frequency of Communication with Friends and Family: Three times a week     Frequency of Social Gatherings with Friends and Family: Three times a week    Attends Religious Services: Never    Active Member of Clubs or Organizations: No    Attends Banker Meetings: Never    Marital Status: Widowed  Intimate Partner Violence: Not At Risk (08/15/2023)   Humiliation, Afraid, Rape, and Kick questionnaire    Fear of Current or Ex-Partner: No    Emotionally Abused: No    Physically Abused: No    Sexually Abused: No     FAMILY HISTORY Family History  Problem Relation Age of Onset   Cancer Mother        lung   Depression Mother    Cancer Father        prostate   Depression Sister    Anxiety disorder Sister    Schizophrenia Sister    Bipolar disorder Sister    Depression Sister    Depression Brother    Heart failure Other        cousin     Review of Systems: 12 systems were reviewed and negative except per HPI  Physical Exam: Vitals:   09/22/23 0611 09/22/23 0615  BP:  121/82  Pulse:  86  Resp:  14  Temp: 98 F (36.7 C)   SpO2:  96%   No  intake/output data recorded. No intake or output data in the 24 hours ending 09/22/23 0644 General: well-appearing, no acute distress HEENT: anicteric sclera, MMM CV: normal rate, no murmurs, 2+ edema b/l LEs Lungs: bilateral chest rise, normal wob Abd: soft, non-tender, non-distended Skin: no visible lesions or rashes Psych: alert, engaged, appropriate mood and affect Neuro: normal speech, no gross focal deficits  Dialysis access: right [facility]  Test Results Reviewed Lab Results  Component Value Date   NA 140 09/17/2023   K 3.1 (L) 09/17/2023   CL 104 09/17/2023   CO2 21 (L) 09/17/2023   BUN 37 (H) 09/17/2023   CREATININE 4.46 (H) 09/17/2023   CALCIUM  8.2 (L) 09/17/2023   ALBUMIN  3.0 (L) 09/17/2023   PHOS 4.8 (H) 09/06/2023    I have reviewed relevant outside healthcare records

## 2023-09-22 NOTE — ED Notes (Signed)
 Pt called out reporting she was wet. Linen change completed.

## 2023-09-22 NOTE — ED Provider Notes (Signed)
    Accepted handoff at shift change from [name] PA-C. Please see prior provider note for more detail.   Briefly: Patient is 63 y.o.  presents to the ED requesting dialysis. Last dialyzed on Monday. Frequently comes to the ED to be dialyzed. No acute complaints today. Denies CP, SOB, N/V.  Plan:  - patient resting in bed and denies any acute symptoms today - she is just hungry and asking for food. I have placed diet order.  - dispo pending dialysis. If reassuring, patient should be eligible for discharge. Nephrologist on-call, Dr. Dennise, was made aware that patient is here by night shift provider.   Tiffany [name], [place] 09/22/23 0751    Raford Lenis, MD 09/22/23 [phone]

## 2023-09-23 LAB — HEPATITIS B SURFACE ANTIBODY, QUANTITATIVE: Hep B S AB Quant (Post): 2681 m[IU]/mL

## 2023-09-23 NOTE — Discharge Planning (Signed)
 [facility] Patient Discharge Orders- [facility] CLINIC: [place]  Patient's name: Tiffany [name] Admit/DC Dates: 09/21/2023 - 09/22/2023  Discharge Diagnoses: ESRD needing ED HD. She doesn't want to return to [place]. We need to see if transferring her is on option.   Aranesp : Given: No     Last Hgb: 7 PRBC's Given: No ESA dose for discharge: Restart mircera per protocol IV Iron  dose at discharge: N/A   Heparin  change: No  EDW Change: No  Bath Change: No  Access intervention/Change: No   Hectorol/Calcitriol  change: No  Discharge Labs: Calcium  8.5 Phosphorus 6.7 Albumin  3.0 K+ 4.1  IV Antibiotics: No  On Coumadin?: Not sure   OTHER/APPTS/LAB ORDERS:  PLEASE NOTE: PATIENT HAS BED BUGS!!!!! THIS WAS NOTED AT ED VISIT ON 9/6. IF PATIENT SHOWS UP, ENSURE STAFF WEARS PROPER PPE AND PATIENT AND HER BELONGINGS ARE STATIONED AT EAST APPROPRIATELY!!!!!!!!!!!!  Check new set routine labs if she shows up Continue using the current HD setting: 4hrs, BFR 500, DFR Auto 1.5, EDW 82kg, 3K/2Ca, RIJ TDC    D/C Meds to be reconciled by nurse after every discharge.  Completed By: Charmaine Piety, NP   Reviewed by: MD:______ RN_______

## 2023-09-26 ENCOUNTER — Emergency Department (HOSPITAL_COMMUNITY)
Admission: EM | Admit: 2023-09-26 | Discharge: 2023-09-26 | Disposition: A | Discharge status: Home / Self Care | Attending: Emergency Medicine | Admitting: Emergency Medicine

## 2023-09-26 ENCOUNTER — Other Ambulatory Visit: Payer: Self-pay

## 2023-09-26 DIAGNOSIS — Z91010 Allergy to peanuts: Secondary | ICD-10-CM | POA: Insufficient documentation

## 2023-09-26 DIAGNOSIS — Z992 Dependence on renal dialysis: Secondary | ICD-10-CM | POA: Diagnosis present

## 2023-09-26 DIAGNOSIS — N186 End stage renal disease: Secondary | ICD-10-CM

## 2023-09-26 MED ORDER — ALTEPLASE 2 MG IJ SOLR: 2.0000 mg | Freq: Once | INTRAMUSCULAR | Status: DC | PRN

## 2023-09-26 MED ORDER — HEPARIN SODIUM (PORCINE) 1000 UNIT/ML DIALYSIS
1000.0000 [IU] | INTRAMUSCULAR | Status: DC | PRN
  Administered 2023-09-26: 1000 [IU] via INTRAVENOUS_CENTRAL
  Filled 2023-09-26: qty 1

## 2023-09-26 MED ORDER — CHLORHEXIDINE GLUCONATE CLOTH 2 % EX PADS
6.0000 | MEDICATED_PAD | Freq: Every day | CUTANEOUS | Status: DC
Start: 2023-09-26 — End: 2023-09-26

## 2023-09-26 MED ORDER — HEPARIN SODIUM (PORCINE) 1000 UNIT/ML IJ SOLN
INTRAMUSCULAR | Status: AC
  Filled 2023-09-26: qty 4

## 2023-09-26 NOTE — ED Provider Notes (Signed)
 [place] EMERGENCY DEPARTMENT AT [facility] Provider Note   CSN: [id number] Arrival date & time: 09/26/23  9251     Patient presents with: No chief complaint on file.   Tiffany [name] is a 63 y.o. female.   63 year old patient who is here requesting dialysis.  Patient was last dialyzed 4 days ago.  No new complaints at this time       Prior to Admission medications   Medication Sig Start Date End Date Taking? Authorizing Provider  amLODipine  (NORVASC ) 5 MG tablet Take 1 tablet (5 mg total) by mouth 2 (two) times daily. 09/06/23   Donnelly Mellow, MD  atorvastatin  (LIPITOR ) 20 MG tablet Take 1 tablet (20 mg total) by mouth daily. 07/07/23 10/05/23  [place], Eric J, DO  calcitRIOL  (ROCALTROL ) 0.5 MCG capsule Take 1 capsule (0.5 mcg total) by mouth daily. 07/07/23   [place], Camellia PARAS, DO  carvedilol  (COREG ) 12.5 MG tablet Take 1 tablet (12.5 mg total) by mouth 2 (two) times daily. 07/07/23 10/05/23  [place], Camellia PARAS, DO  docusate sodium  (COLACE) 100 MG capsule Take 1 capsule (100 mg total) by mouth daily. 09/07/23   Jadapalle, Sree, MD  DULoxetine  (CYMBALTA ) 30 MG capsule Take 1 capsule (30 mg total) by mouth daily. 09/06/23   Jadapalle, Sree, MD  epoetin  alfa (EPOGEN ) 10000 UNIT/ML injection Inject 1 mL (10,000 Units total) into the vein every Monday, Wednesday, and Friday with hemodialysis. 11/22/22   Cam Charlie Loving, DO  escitalopram  (LEXAPRO ) 10 MG tablet Take 1 tablet (10 mg total) by mouth daily. 09/06/23 12/05/23  Jadapalle, Sree, MD  ezetimibe  (ZETIA ) 10 MG tablet Take 1 tablet (10 mg total) by mouth daily. 09/07/23   Donnelly Mellow, MD  ferrous sulfate  325 (65 FE) MG tablet Take 1 tablet (325 mg total) by mouth daily. 07/07/23 07/01/24  [place], Eric J, DO  folic acid  (FOLVITE ) 1 MG tablet Take 1 tablet (1 mg total) by mouth daily. 07/07/23   [place], Camellia PARAS, DO  Melatonin 10 MG TBDP Take 1 tablet (10 mg) by mouth at bedtime. 09/06/23   Donnelly Mellow, MD  metolazone   (ZAROXOLYN ) 2.5 MG tablet Take 1 tablet (2.5 mg total) by mouth every Monday, Wednesday, and Friday. 07/09/23 10/07/23  [place], Camellia PARAS, DO  omeprazole  (PRILOSEC) 40 MG capsule Take 1 capsule (40 mg total) by mouth every morning. 07/07/23 10/05/23  [place], Camellia PARAS, DO  pantoprazole  (PROTONIX ) 40 MG tablet Take 1 tablet (40 mg total) by mouth daily. 09/07/23   Donnelly Mellow, MD  potassium chloride  SA (KLOR-CON  M) 20 MEQ tablet Take 1 tablet (20 mEq total) by mouth 2 (two) times daily. 09/06/23   Donnelly Mellow, MD  prazosin  (MINIPRESS ) 2 MG capsule Take 1 capsule (2 mg total) by mouth at bedtime. Patient not taking: Reported on 08/14/2023 02/26/23     torsemide  40 MG TABS Take 40 mg by mouth 2 (two) times daily. 08/04/23   [place], Prosper M, MD    Allergies: Asa [aspirin ], Deltasone  [prednisone ], Latuda [lurasidone hcl], Lortab [hydrocodone -acetaminophen ], Magnesium -containing compounds, Ultram  [tramadol ], Codeine, Desyrel  [trazodone ], Peanut (diagnostic), Apresoline  [hydralazine ], Sulfa antibiotics, Tape, and Topamax  [topiramate ]    Review of Systems  All other systems reviewed and are negative.   Updated Vital Signs BP 133/82 (BP Location: Right Arm)   Pulse 81   Temp 98.9 F (37.2 C)   Resp 17   SpO2 98%   Physical Exam Vitals and nursing note reviewed.  Constitutional:      General: She  is not in acute distress.    Appearance: Normal appearance. [name] well-developed. [name] not toxic-appearing.  HENT:     Head: Normocephalic and atraumatic.  Eyes:     General: Lids are normal.     Conjunctiva/sclera: Conjunctivae normal.     Pupils: Pupils are equal, round, and reactive to light.  Neck:     Thyroid : No thyroid  mass.     Trachea: No tracheal deviation.  Cardiovascular:     Rate and Rhythm: Normal rate and regular rhythm.     Heart sounds: Normal heart sounds. No murmur heard.    No gallop.  Pulmonary:     Effort: Pulmonary effort is normal. No respiratory distress.      Breath sounds: Normal breath sounds. No stridor. No decreased breath sounds, wheezing, rhonchi or rales.  Abdominal:     General: There is no distension.     Palpations: Abdomen is soft.     Tenderness: There is no abdominal tenderness. There is no rebound.  Musculoskeletal:        General: No tenderness. Normal range of motion.     Cervical back: Normal range of motion and neck supple.  Skin:    General: Skin is warm and dry.     Findings: No abrasion or rash.  Neurological:     Mental Status: [name] alert and oriented to person, place, and time. Mental status is at baseline.     GCS: GCS eye subscore is 4. GCS verbal subscore is 5. GCS motor subscore is 6.     Cranial Nerves: No cranial nerve deficit.     Sensory: No sensory deficit.     Motor: Motor function is intact.  Psychiatric:        Attention and Perception: Attention normal.        Speech: Speech normal.        Behavior: Behavior normal.     (all labs ordered are listed, but only abnormal results are displayed) Labs Reviewed - No data to display  EKG: None  Radiology: No results found.   Procedures   Medications Ordered in the ED  Chlorhexidine  Gluconate Cloth 2 % PADS 6 each (has no administration in time range)                                    Medical Decision Making  Will contact nephrologist for dialysis     Final diagnoses:  None    ED Discharge Orders     None          Dasie Faden, MD 09/26/23 1005

## 2023-09-26 NOTE — ED Triage Notes (Signed)
 Pt. Stated Im here for dialysis. Friday was the last treatment. Didn't come Monday due to transportation.

## 2023-09-26 NOTE — ED Triage Notes (Signed)
 Quick triage note- Pt arrives via POV requesting dialysis, last tx Friday. Endorses chronic leg pain and fluid overload.

## 2023-09-26 NOTE — Progress Notes (Signed)
 Pt known to navigator from past admissions. Pt has refused to resume care at [facility]. Navigator has been in contact with clinic on several occasions regarding this situation. Spoke to clinic manager this morning who reports that pt was denied a transfer to another [facility] clinic yesterday and that [place] [facility] will attempt to make a transfer request to other [facility] clinics as soon as possible. Navigator inquired if referral needed to be made from hospital and was advised that clinic will make transfer requests and will advise navigator if referral needs to be made from the hospital. Update provided to nephrologist and renal NP. Will await update from clinic as to if pt has been accepted at another local clinic.   Randine Mungo Dialysis Navigator [phone]

## 2023-09-26 NOTE — ED Notes (Signed)
 Pt was given AVS papers & she left before d/c v/s were obtained.

## 2023-09-26 NOTE — ED Provider Notes (Signed)
 Pt presented to the ED earlier today with request for dialysis.  She was last dialyzed on 9/6.  [name] feeling better after receiving dialysis today and wants to go home.  She remains stable for d/c.    Dean Clarity, MD 09/26/23 [phone]

## 2023-09-29 ENCOUNTER — Encounter (HOSPITAL_COMMUNITY): Payer: Self-pay

## 2023-09-29 ENCOUNTER — Other Ambulatory Visit: Payer: Self-pay

## 2023-09-29 ENCOUNTER — Emergency Department (HOSPITAL_COMMUNITY)
Admission: EM | Admit: 2023-09-29 | Discharge: 2023-09-30 | Disposition: A | Discharge status: Home / Self Care | Attending: Emergency Medicine | Admitting: Emergency Medicine

## 2023-09-29 DIAGNOSIS — N186 End stage renal disease: Secondary | ICD-10-CM | POA: Insufficient documentation

## 2023-09-29 DIAGNOSIS — Z992 Dependence on renal dialysis: Secondary | ICD-10-CM | POA: Diagnosis not present

## 2023-09-29 DIAGNOSIS — E8770 Fluid overload, unspecified: Secondary | ICD-10-CM | POA: Diagnosis not present

## 2023-09-29 LAB — COMPREHENSIVE METABOLIC PANEL WITH GFR
ALT: 8 U/L (ref 0–44)
AST: 18 U/L (ref 15–41)
Albumin: 3.2 g/dL — ABNORMAL LOW (ref 3.5–5.0)
Alkaline Phosphatase: 107 U/L (ref 38–126)
Anion gap: 14 (ref 5–15)
BUN: 57 mg/dL — ABNORMAL HIGH (ref 8–23)
CO2: 19 mmol/L — ABNORMAL LOW (ref 22–32)
Calcium: 9.2 mg/dL (ref 8.9–10.3)
Chloride: 94 mmol/L — ABNORMAL LOW (ref 98–111)
Creatinine, Ser: 4.23 mg/dL — ABNORMAL HIGH (ref 0.44–1.00)
GFR, Estimated: 11 mL/min — ABNORMAL LOW (ref >60)
Glucose, Bld: 92 mg/dL (ref 70–99)
Potassium: 3.9 mmol/L (ref 3.5–5.1)
Sodium: 127 mmol/L — ABNORMAL LOW (ref 135–145)
Total Bilirubin: 0.5 mg/dL (ref 0.0–1.2)
Total Protein: 6.4 g/dL — ABNORMAL LOW (ref 6.5–8.1)

## 2023-09-29 LAB — CBC
HCT: 25.2 % — ABNORMAL LOW (ref 36.0–46.0)
Hemoglobin: 7.6 g/dL — ABNORMAL LOW (ref 12.0–15.0)
MCH: 28.7 pg (ref 26.0–34.0)
MCHC: 30.2 g/dL (ref 30.0–36.0)
MCV: 95.1 fL (ref 80.0–100.0)
Platelets: 286 K/uL (ref 150–400)
RBC: 2.65 MIL/uL — ABNORMAL LOW (ref 3.87–5.11)
RDW: 16.5 % — ABNORMAL HIGH (ref 11.5–15.5)
WBC: 9.3 K/uL (ref 4.0–10.5)
nRBC: 0 % (ref 0.0–0.2)

## 2023-09-29 LAB — I-STAT CHEM 8, ED
BUN: 60 mg/dL — ABNORMAL HIGH (ref 8–23)
Calcium, Ion: 1.14 mmol/L — ABNORMAL LOW (ref 1.15–1.40)
Chloride: 98 mmol/L (ref 98–111)
Creatinine, Ser: 4.8 mg/dL — ABNORMAL HIGH (ref 0.44–1.00)
Glucose, Bld: 95 mg/dL (ref 70–99)
HCT: 25 % — ABNORMAL LOW (ref 36.0–46.0)
Hemoglobin: 8.5 g/dL — ABNORMAL LOW (ref 12.0–15.0)
Potassium: 4 mmol/L (ref 3.5–5.1)
Sodium: 131 mmol/L — ABNORMAL LOW (ref 135–145)
TCO2: 26 mmol/L (ref 22–32)

## 2023-09-29 MED ORDER — HEPARIN SODIUM (PORCINE) 1000 UNIT/ML DIALYSIS: 20.0000 [IU]/kg | INTRAMUSCULAR | Status: DC | PRN

## 2023-09-29 MED ORDER — LIDOCAINE-PRILOCAINE 2.5-2.5 % EX CREA: 1.0000 | TOPICAL_CREAM | CUTANEOUS | Status: DC | PRN

## 2023-09-29 MED ORDER — HEPARIN SODIUM (PORCINE) 1000 UNIT/ML DIALYSIS: 1000.0000 [IU] | INTRAMUSCULAR | Status: DC | PRN

## 2023-09-29 MED ORDER — CHLORHEXIDINE GLUCONATE CLOTH 2 % EX PADS: 6.0000 | MEDICATED_PAD | Freq: Every day | CUTANEOUS | Status: DC

## 2023-09-29 MED ORDER — ANTICOAGULANT SODIUM CITRATE 4% (200MG/5ML) IV SOLN: 5.0000 mL | Status: DC | PRN

## 2023-09-29 MED ORDER — ALTEPLASE 2 MG IJ SOLR: 2.0000 mg | Freq: Once | INTRAMUSCULAR | Status: DC | PRN

## 2023-09-29 MED ORDER — PENTAFLUOROPROP-TETRAFLUOROETH EX AERO: 1.0000 | INHALATION_SPRAY | CUTANEOUS | Status: DC | PRN

## 2023-09-29 MED ORDER — LIDOCAINE HCL (PF) 1 % IJ SOLN: 5.0000 mL | INTRAMUSCULAR | Status: DC | PRN

## 2023-09-29 NOTE — ED Provider Notes (Signed)
 [place] EMERGENCY DEPARTMENT AT [facility] Provider Note   CSN: [id number] Arrival date & time: 09/29/23  1243     Patient presents with: Request Dialysis   Tiffany [name] is a 63 y.o. female.  She has a history of end-stage renal disease and is on dialysis, last dialysis was 4 days ago.  She does not have a regular dialysis center however and comes to the emergency department to get it.  She endorses shortness of breath.  No chest pain fever or cough.  No other medical complaints.   The history is provided by the patient.       Prior to Admission medications   Medication Sig Start Date End Date Taking? Authorizing Provider  amLODipine  (NORVASC ) 5 MG tablet Take 1 tablet (5 mg total) by mouth 2 (two) times daily. 09/06/23   Donnelly Mellow, MD  atorvastatin  (LIPITOR ) 20 MG tablet Take 1 tablet (20 mg total) by mouth daily. 07/07/23 10/05/23  [place], Camellia PARAS, DO  calcitRIOL  (ROCALTROL ) 0.5 MCG capsule Take 1 capsule (0.5 mcg total) by mouth daily. 07/07/23   [place], Camellia PARAS, DO  carvedilol  (COREG ) 12.5 MG tablet Take 1 tablet (12.5 mg total) by mouth 2 (two) times daily. 07/07/23 10/05/23  [place], Camellia PARAS, DO  docusate sodium  (COLACE) 100 MG capsule Take 1 capsule (100 mg total) by mouth daily. 09/07/23   Jadapalle, Sree, MD  DULoxetine  (CYMBALTA ) 30 MG capsule Take 1 capsule (30 mg total) by mouth daily. 09/06/23   Donnelly Mellow, MD  epoetin  alfa (EPOGEN ) 10000 UNIT/ML injection Inject 1 mL (10,000 Units total) into the vein every Monday, Wednesday, and Friday with hemodialysis. 11/22/22   Cam Charlie Loving, DO  escitalopram  (LEXAPRO ) 10 MG tablet Take 1 tablet (10 mg total) by mouth daily. 09/06/23 12/05/23  Jadapalle, Sree, MD  ezetimibe  (ZETIA ) 10 MG tablet Take 1 tablet (10 mg total) by mouth daily. 09/07/23   Jadapalle, Sree, MD  ferrous sulfate  325 (65 FE) MG tablet Take 1 tablet (325 mg total) by mouth daily. 07/07/23 07/01/24  [place], Camellia PARAS, DO  folic acid  (FOLVITE )  1 MG tablet Take 1 tablet (1 mg total) by mouth daily. 07/07/23   [place], Camellia PARAS, DO  Melatonin 10 MG TBDP Take 1 tablet (10 mg) by mouth at bedtime. 09/06/23   Jadapalle, Sree, MD  metolazone  (ZAROXOLYN ) 2.5 MG tablet Take 1 tablet (2.5 mg total) by mouth every Monday, Wednesday, and Friday. 07/09/23 10/07/23  [place], Camellia PARAS, DO  omeprazole  (PRILOSEC) 40 MG capsule Take 1 capsule (40 mg total) by mouth every morning. 07/07/23 10/05/23  [place], Camellia PARAS, DO  pantoprazole  (PROTONIX ) 40 MG tablet Take 1 tablet (40 mg total) by mouth daily. 09/07/23   Donnelly Mellow, MD  potassium chloride  SA (KLOR-CON  M) 20 MEQ tablet Take 1 tablet (20 mEq total) by mouth 2 (two) times daily. 09/06/23   Donnelly Mellow, MD  prazosin  (MINIPRESS ) 2 MG capsule Take 1 capsule (2 mg total) by mouth at bedtime. Patient not taking: Reported on 08/14/2023 02/26/23     torsemide  40 MG TABS Take 40 mg by mouth 2 (two) times daily. 08/04/23   Lou Claretta CHRISTELLA, MD    Allergies: Asa [aspirin ], Deltasone  [prednisone ], Latuda [lurasidone hcl], Lortab [hydrocodone -acetaminophen ], Magnesium -containing compounds, Ultram  [tramadol ], Codeine, Desyrel  [trazodone ], Peanut (diagnostic), Apresoline  [hydralazine ], Sulfa antibiotics, Tape, and Topamax  [topiramate ]    Review of Systems  Constitutional:  Negative for fever.  Respiratory:  Positive for shortness of breath.   Cardiovascular:  Negative for chest pain.  Gastrointestinal:  Negative for abdominal pain.  Skin:  Negative for rash.    Updated Vital Signs BP 125/75   Pulse 78   Temp 98.2 F (36.8 C)   Resp 20   SpO2 98%   Physical Exam Vitals and nursing note reviewed.  Constitutional:      General: [name] not in acute distress.    Appearance: Normal appearance. [name] well-developed.  HENT:     Head: Normocephalic and atraumatic.  Eyes:     Conjunctiva/sclera: Conjunctivae normal.  Cardiovascular:     Rate and Rhythm: Normal rate and regular rhythm.     Heart sounds: No  murmur heard. Pulmonary:     Effort: Pulmonary effort is normal. No respiratory distress.     Breath sounds: Normal breath sounds.     Comments: She has a catheter in her  right upper chest without surrounding erythema or tenderness Abdominal:     Palpations: Abdomen is soft.     Tenderness: There is no abdominal tenderness. There is no guarding or rebound.  Musculoskeletal:     Cervical back: Neck supple.  Skin:    General: Skin is warm and dry.  Neurological:     Mental Status: [name] alert.     GCS: GCS eye subscore is 4. GCS verbal subscore is 5. GCS motor subscore is 6.     (all labs ordered are listed, but only abnormal results are displayed) Labs Reviewed  COMPREHENSIVE METABOLIC PANEL WITH GFR - Abnormal; Notable for the following components:      Result Value   Sodium 127 (*)    Chloride 94 (*)    CO2 19 (*)    BUN 57 (*)    Creatinine, Ser 4.23 (*)    Total Protein 6.4 (*)    Albumin  3.2 (*)    GFR, Estimated 11 (*)    All other components within normal limits  CBC - Abnormal; Notable for the following components:   RBC 2.65 (*)    Hemoglobin 7.6 (*)    HCT 25.2 (*)    RDW 16.5 (*)    All other components within normal limits  I-STAT CHEM 8, ED - Abnormal; Notable for the following components:   Sodium 131 (*)    BUN 60 (*)    Creatinine, Ser 4.80 (*)    Calcium , Ion 1.14 (*)    Hemoglobin 8.5 (*)    HCT 25.0 (*)    All other components within normal limits  HEPATITIS B SURFACE ANTIGEN  HEPATITIS B SURFACE ANTIBODY, QUANTITATIVE    EKG: None  Radiology: No results found.   Procedures   Medications Ordered in the ED - No data to display  Clinical Course as of 09/29/23 1654  Sat Sep 29, 2023  1458 Discussed with Dr. Dolan nephrology who will work on getting patient dialysis.  He says it is probably gena be later tonight though. [MB]    Clinical Course User Index [MB] Towana Ozell BROCKS, MD                                 Medical Decision  Making Amount and/or Complexity of Data Reviewed Labs: ordered.   This patient complains of needing dialysis shortness of breath; this involves an extensive number of treatment Options and is a complaint that carries with it a high risk of complications and morbidity. The differential includes  CHF, fluid overload, hyperkalemia  I ordered, reviewed and interpreted labs, which included CBC with chronically low hemoglobin, chemistries with low sodium normal potassium, otherwise consistent with end-stage renal disease Previous records obtained and reviewed in epic, she seems to come to the ED for her dialysis I consulted nephrology Dr. Dolan and discussed lab and imaging findings and discussed disposition.  Cardiac monitoring reviewed, sinus rhythm Social determinants considered, multiple barriers including food, physically inactive, stress, social isolation Critical Interventions: None  After the interventions stated above, I reevaluated the patient and found patient be resting comfortably not on supplemental oxygen  Admission and further testing considered, she understands she will be waiting for period of time before dialysis can be obtained.  [name] comfortable plan for waiting for dialysis.      Final diagnoses:  Hypervolemia, unspecified hypervolemia type  End stage renal disease [facility])    ED Discharge Orders     None          Towana Ozell BROCKS, MD 09/29/23 1657

## 2023-09-29 NOTE — ED Provider Notes (Signed)
 3:03 PM Patient signed out to me by previous ED physician. Pt going for dialysis from ED with plan to dc from ED after. Waiting to be taken... likely later this evening.   Physical Exam  BP 125/75   Pulse 78   Temp 98.2 F (36.8 C)   Resp 20   SpO2 98%   Physical Exam  Procedures  Procedures  ED Course / MDM   Clinical Course as of 09/29/23 1503  Sat Sep 29, 2023  1458 Discussed with Dr. Dolan nephrology who will work on getting patient dialysis.  He says it is probably gena be later tonight though. [MB]    Clinical Course User Index [MB] Towana Ozell BROCKS, MD   Medical Decision Making Amount and/or Complexity of Data Reviewed Labs: ordered.   Pt back from dialysis at shift change. Patient's care signed out to Dr. Bari.       Elnor Hila P, DO 10/08/23 [phone]

## 2023-09-29 NOTE — ED Triage Notes (Signed)
 Pt came in via POV requesting dialysis, has not missed any Tx, A/Ox4, no acute distress noted during triage.

## 2023-09-29 NOTE — ED Notes (Signed)
 Pt placed onto an ER stretcher.

## 2023-09-29 NOTE — Progress Notes (Signed)
 Tiffany [name]  Assessment/ Plan: Pt is a 62 y.o. yo female with past medical history significant for hypertension, CHF, type II DM, ESRD on HD presented to the ER for dialysis need.  # ESRD: Noncompliant with outpatient HD.  Stated that her home center is [facility] however patient has not been there for a long time.  Patient stated that she [place] not [place] and wants to go to home after dialysis today.  We will arrange for HD today.  Noted she was seen by renal navigator last week.  She has TDC for the access.  # Anemia hemoglobin is low, recent iron  saturation acceptable.  I will order a dose of Aranesp .  # CKD-MBD: Nonadherence with outpatient dialysis, monitor lab, reinforced binders.  # HTN/volume: UF with HD, she has significant peripheral edema.  # Disposition: I tried to educate patient that if she cannot stay in the hospital we will try to find a center close to where she lives.  The patient said she cannot stay in the hospital and wants to go home after dialysis.  We will continue to provide support and discussion to find outpatient dialysis.   Subjective: Seen and examined.  Reports mild SOB.  Denies chest pain, nausea, vomiting.  Discussed with ER provider. Objective Vital signs in last 24 hours: Vitals:   09/29/23 1246  BP: 125/75  Pulse: 78  Resp: 20  Temp: 98.2 F (36.8 C)  SpO2: 98%   Weight change:  No intake or output data in the 24 hours ending 09/29/23 1628     Labs: RENAL PANEL Recent Labs  Lab 09/29/23 1331 09/29/23 1336  NA 127* 131*  K 3.9 4.0  CL 94* 98  CO2 19*  --   GLUCOSE 92 95  BUN 57* 60*  CREATININE 4.23* 4.80*  CALCIUM  9.2  --   ALBUMIN  3.2*  --     Liver Function Tests: Recent Labs  Lab 09/29/23 1331  AST 18  ALT 8  ALKPHOS 107  BILITOT 0.5  PROT 6.4*  ALBUMIN  3.2*   No results for input(s): LIPASE, AMYLASE in the last 168 hours. No results for  input(s): AMMONIA  in the last 168 hours. CBC: Recent Labs    06/30/23 0500 07/03/23 0601 07/29/23 1728 07/30/23 0403 09/14/23 1649 09/17/23 1320 09/22/23 0800 09/29/23 1331 09/29/23 1336  HGB 9.2*   < > 7.0*   < > 7.5* 7.8* 7.0* 7.6* 8.5*  MCV 97.1   < > 93.7   < > 98.4 98.5 95.5 95.1  --   VITAMINB12 369  --  501  --   --   --   --   --   --   FOLATE 8.9  --  25.5  --   --   --   --   --   --   FERRITIN 638*  --  687*  --   --   --  238  --   --   TIBC 175*  --  217*  --   --   --  249*  --   --   IRON  96  --  86  --   --   --  81  --   --   RETICCTPCT  --   --  1.9  --   --   --   --   --   --    < > = values in  this interval not displayed.    Cardiac Enzymes: No results for input(s): CKTOTAL, CKMB, CKMBINDEX, TROPONINI in the last 168 hours. CBG: No results for input(s): GLUCAP in the last 168 hours.  Iron  Studies: No results for input(s): IRON , TIBC, TRANSFERRIN, FERRITIN in the last 72 hours. Studies/Results: No results found.  Medications: Infusions:   Scheduled Medications:  [START ON 09/30/2023] Chlorhexidine  Gluconate Cloth  6 each Topical Q0600    have reviewed scheduled and prn medications.  Physical Exam: General:NAD, comfortable Heart:RRR, s1s2 nl Lungs:clear b/l, no crackle Abdomen:soft, Non-tender, non-distended Extremities: Bilateral lower extremities pitting edema present++ Dialysis Access: TDC  Tiffany [name] Tiffany [name] 09/29/2023,4:28 PM  LOS: 0 days

## 2023-09-30 NOTE — ED Provider Notes (Signed)
  Physical Exam  BP (!) 132/110 (BP Location: Right Arm)   Pulse 81   Temp 98.3 F (36.8 C) (Oral)   Resp 20   SpO2 99%   Physical Exam Vitals and nursing note reviewed.  Constitutional:      Appearance: Normal appearance.  HENT:     Head: Normocephalic and atraumatic.  Eyes:     General:        Right eye: No discharge.        Left eye: No discharge.     Conjunctiva/sclera: Conjunctivae normal.  Pulmonary:     Effort: Pulmonary effort is normal.  Skin:    General: Skin is warm and dry.     Findings: No rash.  Neurological:     General: No focal deficit present.     Mental Status: [name] alert.  Psychiatric:        Mood and Affect: Mood normal.        Behavior: Behavior normal.     Procedures  Procedures  ED Course / MDM   Clinical Course as of 09/30/23 0754  Sat Sep 29, 2023  1458 Discussed with Dr. Dolan nephrology who will work on getting patient dialysis.  He says it is probably gena be later tonight though. [MB]    Clinical Course User Index [MB] Towana Ozell BROCKS, MD   Medical Decision Making Amount and/or Complexity of Data Reviewed Labs: ordered.   Patient back from dialysis.  [name] feeling at baseline.  She denies any chest pain, shortness of breath, abdominal pain, nausea, vomit, diarrhea.  No fever or chills.  Patient ready for discharge.  Strict turn precautions were discussed.  [name] stable at this time.       Theotis Peers [place], PA-C 09/30/23 9245    Dasie Faden, MD 10/02/23 [phone]

## 2023-09-30 NOTE — Discharge Instructions (Signed)
 Please return to the ED for further any worsening symptoms.

## 2023-10-01 ENCOUNTER — Emergency Department (HOSPITAL_COMMUNITY)

## 2023-10-01 ENCOUNTER — Encounter (HOSPITAL_COMMUNITY): Payer: Self-pay

## 2023-10-01 ENCOUNTER — Emergency Department (HOSPITAL_COMMUNITY)
Admission: EM | Admit: 2023-10-01 | Discharge: 2023-10-02 | Discharge status: Against Medical Advice | Attending: Emergency Medicine | Admitting: Emergency Medicine

## 2023-10-01 ENCOUNTER — Other Ambulatory Visit: Payer: Self-pay

## 2023-10-01 DIAGNOSIS — E1140 Type 2 diabetes mellitus with diabetic neuropathy, unspecified: Secondary | ICD-10-CM | POA: Insufficient documentation

## 2023-10-01 DIAGNOSIS — Z992 Dependence on renal dialysis: Secondary | ICD-10-CM | POA: Insufficient documentation

## 2023-10-01 DIAGNOSIS — Z79899 Other long term (current) drug therapy: Secondary | ICD-10-CM | POA: Diagnosis not present

## 2023-10-01 DIAGNOSIS — Z5329 Procedure and treatment not carried out because of patient's decision for other reasons: Secondary | ICD-10-CM | POA: Diagnosis not present

## 2023-10-01 DIAGNOSIS — N186 End stage renal disease: Secondary | ICD-10-CM | POA: Insufficient documentation

## 2023-10-01 DIAGNOSIS — I120 Hypertensive chronic kidney disease with stage 5 chronic kidney disease or end stage renal disease: Secondary | ICD-10-CM | POA: Diagnosis not present

## 2023-10-01 DIAGNOSIS — E1122 Type 2 diabetes mellitus with diabetic chronic kidney disease: Secondary | ICD-10-CM | POA: Diagnosis not present

## 2023-10-01 DIAGNOSIS — R0602 Shortness of breath: Secondary | ICD-10-CM | POA: Diagnosis present

## 2023-10-01 LAB — CBC WITH DIFFERENTIAL/PLATELET
Abs Immature Granulocytes: 0.07 K/uL (ref 0.00–0.07)
Basophils Absolute: 0 K/uL (ref 0.0–0.1)
Basophils Relative: 0 %
Eosinophils Absolute: 0.2 K/uL (ref 0.0–0.5)
Eosinophils Relative: 2 %
HCT: 26.6 % — ABNORMAL LOW (ref 36.0–46.0)
Hemoglobin: 8.1 g/dL — ABNORMAL LOW (ref 12.0–15.0)
Immature Granulocytes: 1 %
Lymphocytes Relative: 18 %
Lymphs Abs: 1.5 K/uL (ref 0.7–4.0)
MCH: 28.9 pg (ref 26.0–34.0)
MCHC: 30.5 g/dL (ref 30.0–36.0)
MCV: 95 fL (ref 80.0–100.0)
Monocytes Absolute: 0.6 K/uL (ref 0.1–1.0)
Monocytes Relative: 8 %
Neutro Abs: 5.7 K/uL (ref 1.7–7.7)
Neutrophils Relative %: 71 %
Platelets: 246 K/uL (ref 150–400)
RBC: 2.8 MIL/uL — ABNORMAL LOW (ref 3.87–5.11)
RDW: 16.5 % — ABNORMAL HIGH (ref 11.5–15.5)
WBC: 8 K/uL (ref 4.0–10.5)
nRBC: 0 % (ref 0.0–0.2)

## 2023-10-01 LAB — COMPREHENSIVE METABOLIC PANEL WITH GFR
ALT: 10 U/L (ref 0–44)
AST: 17 U/L (ref 15–41)
Albumin: 3.2 g/dL — ABNORMAL LOW (ref 3.5–5.0)
Alkaline Phosphatase: 107 U/L (ref 38–126)
Anion gap: 13 (ref 5–15)
BUN: 55 mg/dL — ABNORMAL HIGH (ref 8–23)
CO2: 21 mmol/L — ABNORMAL LOW (ref 22–32)
Calcium: 9.5 mg/dL (ref 8.9–10.3)
Chloride: 101 mmol/L (ref 98–111)
Creatinine, Ser: 4.61 mg/dL — ABNORMAL HIGH (ref 0.44–1.00)
GFR, Estimated: 10 mL/min — ABNORMAL LOW (ref >60)
Glucose, Bld: 131 mg/dL — ABNORMAL HIGH (ref 70–99)
Potassium: 3.5 mmol/L (ref 3.5–5.1)
Sodium: 135 mmol/L (ref 135–145)
Total Bilirubin: 0.2 mg/dL (ref 0.0–1.2)
Total Protein: 6.6 g/dL (ref 6.5–8.1)

## 2023-10-01 NOTE — ED Triage Notes (Signed)
 Pt with a hx of ESRD and COPD is here for her regular HD treatment. She also reports being out of her Trelegy inhaler and now she is having some ShOB and increased sputum production for the last 3 days.

## 2023-10-01 NOTE — ED Provider Triage Note (Signed)
 Emergency Medicine Provider Triage Evaluation Note  ROMONA MURDY , a 63 y.o. female  was evaluated in triage.  Pt complains of hemodialysis and shortness of breath.  Patient is a Monday, Wednesday, Friday hemodialysis patient who typically has her sepsis performed here in the emergency department/dialysis floor.  She does endorse some increasing feelings of shortness of breath and she denies any missed sessions.  She states that she has been compliant with all her home medications.  No recent fever, chills or bodyaches.  Review of Systems  Positive: As above Negative: As above  Physical Exam  BP (!) 142/78 (BP Location: Right Arm)   Pulse 88   Temp 98.7 F (37.1 C)   Resp 20   Ht 5' 3 (1.6 m)   Wt 90.7 kg   SpO2 100%   BMI 35.43 kg/m  Gen:   Awake, no distress   Resp:  Normal effort  MSK:   Moves extremities without difficulty  Other:    Medical Decision Making  Medically screening exam initiated at 9:54 PM.  Appropriate orders placed.  PAYDEN BONUS was informed that the remainder of the evaluation will be completed by another provider, this initial triage assessment does not replace that evaluation, and the importance of remaining in the ED until their evaluation is complete.     Josalin Carneiro A, PA-C 10/01/23 2155

## 2023-10-01 NOTE — ED Triage Notes (Signed)
 Patient is here for dialysis. Patient states she comes to the ER for dialysis. Last session was Saturday night. Also reports feeling shortness of breath.

## 2023-10-02 MED ORDER — CHLORHEXIDINE GLUCONATE CLOTH 2 % EX PADS: 6.0000 | MEDICATED_PAD | Freq: Every day | CUTANEOUS | Status: DC

## 2023-10-02 MED ORDER — TRELEGY ELLIPTA 100-62.5-25 MCG/ACT IN AEPB: 1.0000 | INHALATION_SPRAY | Freq: Every day | RESPIRATORY_TRACT | 3 refills | Status: DC

## 2023-10-02 NOTE — ED Notes (Signed)
 PT ADVISED SHE WAS GOING OUTSIDE .  NURSE ADVISED TO STAY INSIDE.

## 2023-10-02 NOTE — ED Notes (Signed)
 Pt stated I am leaving now  RN told pt if she left she would need to check back in to get dialysis. Pt stated I have things to do

## 2023-10-02 NOTE — ED Notes (Signed)
 Breakfast tray ordered

## 2023-10-02 NOTE — ED Notes (Signed)
 Pt is in no acute distress, delay explained to pt.  [name] going outside to get some air

## 2023-10-02 NOTE — Progress Notes (Signed)
 Contacted by ED provider of pt's return to ED for HD. Nephrology providers aware as well. Navigator contacted pt's last HD clinic [facility] [place]) to speak to clinic staff as to if a new clinic/provider has been located. At this time, pt has not been accepted by any other [place] clinic and pt refuses to return to [facility] for care. Clinic staff to continue efforts to contact a few more clinics to see if they would be an option for pt. Navigator also submitted referral to [facility] admissions to see if pt could be accepted at the [facility] [address] [facility] or another [facility] clinic in [place]. Will await determination and/or responses. Will assist as able.   Randine Mungo Dialysis Navigator [phone]

## 2023-10-02 NOTE — ED Provider Notes (Signed)
 [place] EMERGENCY DEPARTMENT AT [facility] Provider Note   CSN: [id number] Arrival date & time: 10/01/23  2006     Patient presents with: Shortness of Breath and Hemodialysis   Tiffany [name] is a 63 y.o. female.   Pt is a 63y/o female with hx of ESRD on HD TTS, seizure, anoxic brain injury, diabetes with diabetic neuropathy, substance use disorder history, OSA noncompliant on CPAP, HTN, HLD, GERD, CVA, schizophrenia, noncompliance who is here today for dialysis.  Patient has now been waiting approximately 12-1/2 hours and it appears that nephrology was contacted when patient first arrived.  Patient has no specific complaints.  Reports she last dialyzed on Saturday night/Sunday morning.  Her normal schedule is Tuesday Thursday Saturday.  Currently they are looking for a dialysis center for her to go to.  Her only complaint is that she is tired of coming to the emergency room for dialysis.  She denies any significant shortness of breath at this time.  The history is provided by the patient and medical records.  Shortness of Breath      Prior to Admission medications   Medication Sig Start Date End Date Taking? Authorizing Provider  amLODipine  (NORVASC ) 5 MG tablet Take 1 tablet (5 mg total) by mouth 2 (two) times daily. 09/06/23   Donnelly Mellow, MD  atorvastatin  (LIPITOR ) 20 MG tablet Take 1 tablet (20 mg total) by mouth daily. 07/07/23 10/05/23  [place], Camellia PARAS, DO  calcitRIOL  (ROCALTROL ) 0.5 MCG capsule Take 1 capsule (0.5 mcg total) by mouth daily. 07/07/23   [place], Camellia PARAS, DO  carvedilol  (COREG ) 12.5 MG tablet Take 1 tablet (12.5 mg total) by mouth 2 (two) times daily. 07/07/23 10/05/23  [place], Camellia PARAS, DO  docusate sodium  (COLACE) 100 MG capsule Take 1 capsule (100 mg total) by mouth daily. 09/07/23   Donnelly Mellow, MD  DULoxetine  (CYMBALTA ) 30 MG capsule Take 1 capsule (30 mg total) by mouth daily. 09/06/23   Jadapalle, Sree, MD  epoetin  alfa (EPOGEN ) 10000 UNIT/ML  injection Inject 1 mL (10,000 Units total) into the vein every Monday, Wednesday, and Friday with hemodialysis. 11/22/22   Cam Charlie Loving, DO  escitalopram  (LEXAPRO ) 10 MG tablet Take 1 tablet (10 mg total) by mouth daily. 09/06/23 12/05/23  Jadapalle, Sree, MD  ezetimibe  (ZETIA ) 10 MG tablet Take 1 tablet (10 mg total) by mouth daily. 09/07/23   Donnelly Mellow, MD  ferrous sulfate  325 (65 FE) MG tablet Take 1 tablet (325 mg total) by mouth daily. 07/07/23 07/01/24  [place], Camellia PARAS, DO  folic acid  (FOLVITE ) 1 MG tablet Take 1 tablet (1 mg total) by mouth daily. 07/07/23   [place], Camellia PARAS, DO  Melatonin 10 MG TBDP Take 1 tablet (10 mg) by mouth at bedtime. 09/06/23   Donnelly Mellow, MD  metolazone  (ZAROXOLYN ) 2.5 MG tablet Take 1 tablet (2.5 mg total) by mouth every Monday, Wednesday, and Friday. 07/09/23 10/07/23  [place], Eric J, DO  omeprazole  (PRILOSEC) 40 MG capsule Take 1 capsule (40 mg total) by mouth every morning. 07/07/23 10/05/23  [place], Camellia PARAS, DO  pantoprazole  (PROTONIX ) 40 MG tablet Take 1 tablet (40 mg total) by mouth daily. 09/07/23   Donnelly Mellow, MD  potassium chloride  SA (KLOR-CON  M) 20 MEQ tablet Take 1 tablet (20 mEq total) by mouth 2 (two) times daily. 09/06/23   Donnelly Mellow, MD  prazosin  (MINIPRESS ) 2 MG capsule Take 1 capsule (2 mg total) by mouth at bedtime. Patient not taking: Reported on 08/14/2023  02/26/23     torsemide  40 MG TABS Take 40 mg by mouth 2 (two) times daily. 08/04/23   Amponsah, Prosper M, MD    Allergies: Asa [aspirin ], Deltasone  [place] ], Latuda [lurasidone hcl], Lortab [hydrocodone -acetaminophen ], Magnesium -containing compounds, Ultram  [tramadol ], Codeine, Desyrel  [trazodone ], Peanut (diagnostic), Apresoline  [hydralazine ], Sulfa antibiotics, Tape, and Topamax  [topiramate ]    Review of Systems  Respiratory:  Positive for shortness of breath.     Updated Vital Signs BP (!) 151/86 (BP Location: Left Arm)   Pulse 79   Temp 98.6 F (37 C) (Oral)    Resp 17   Ht 5' 3 (1.6 m)   Wt 90.7 kg   SpO2 97%   BMI 35.43 kg/m   Physical Exam Vitals and nursing note reviewed.  Constitutional:      Comments: Sitting in her wheelchair in no acute distress.  HENT:     Head: Normocephalic.  Eyes:     Pupils: Pupils are equal, round, and reactive to light.  Cardiovascular:     Rate and Rhythm: Normal rate.     Heart sounds: Murmur heard.  Pulmonary:     Effort: Pulmonary effort is normal. No tachypnea.     Breath sounds: No decreased breath sounds or rhonchi.  Neurological:     Mental Status: [name] alert.     (all labs ordered are listed, but only abnormal results are displayed) Labs Reviewed  CBC WITH DIFFERENTIAL/PLATELET - Abnormal; Notable for the following components:      Result Value   RBC 2.80 (*)    Hemoglobin 8.1 (*)    HCT 26.6 (*)    RDW 16.5 (*)    All other components within normal limits  COMPREHENSIVE METABOLIC PANEL WITH GFR - Abnormal; Notable for the following components:   CO2 21 (*)    Glucose, Bld 131 (*)    BUN 55 (*)    Creatinine, Ser 4.61 (*)    Albumin  3.2 (*)    GFR, Estimated 10 (*)    All other components within normal limits    EKG: None  Radiology: DG Chest 2 View Result Date: 10/01/2023 CLINICAL DATA:  Shortness of breath EXAM: CHEST - 2 VIEW COMPARISON:  Chest x-ray 09/14/2023 FINDINGS: Right chest wall dialysis catheter with tip in the right atrium just distal to the superior caval junction. The heart and mediastinal contours are unchanged. No focal consolidation. No pulmonary edema. No pleural effusion. No pneumothorax. No acute osseous abnormality.  Old healed left rib fractures. IMPRESSION: No active cardiopulmonary disease. Electronically Signed   By: Morgane  Naveau M.D.   On: 10/01/2023 22:57     Procedures   Medications Ordered in the ED - No data to display                                  Medical Decision Making Amount and/or Complexity of Data Reviewed Labs: ordered.  Decision-making details documented in ED Course.   Patient is here for her routine dialysis.  Last dialysis session was early in the a.m. on 09/30/2023.  Currently still looking for a regular unit for patient to go to.  Will reach out to nephrology to ensure that she is on the list for dialysis.  I independently interpreted patient's labs and CBC with unchanged hemoglobin of 8, CMP with findings consistent with end-stage renal disease but normal potassium 3.5.     Final diagnoses:  End stage renal disease  on dialysis [facility])    ED Discharge Orders     None          Doretha Folks, MD 10/02/23 [phone]

## 2023-10-02 NOTE — ED Notes (Addendum)
 Pt refused to move to hall 24 . Pt states that she is leaving. Pt reports it is ridiculous that I have to wait this long to get to dialysis Rn educated pt that the hospital is different from a dialysis center and that patients are not always taken on schedule.

## 2023-10-02 NOTE — ED Notes (Signed)
 Called pt twice for vital check no response.SABRASABRA

## 2023-10-05 ENCOUNTER — Emergency Department (HOSPITAL_COMMUNITY)
Admission: EM | Admit: 2023-10-05 | Discharge: 2023-10-06 | Disposition: A | Discharge status: Home / Self Care | Attending: Emergency Medicine | Admitting: Emergency Medicine

## 2023-10-05 ENCOUNTER — Other Ambulatory Visit: Payer: Self-pay

## 2023-10-05 ENCOUNTER — Encounter (HOSPITAL_COMMUNITY): Payer: Self-pay

## 2023-10-05 ENCOUNTER — Emergency Department (HOSPITAL_COMMUNITY)

## 2023-10-05 DIAGNOSIS — R0602 Shortness of breath: Secondary | ICD-10-CM | POA: Insufficient documentation

## 2023-10-05 DIAGNOSIS — N186 End stage renal disease: Secondary | ICD-10-CM | POA: Diagnosis not present

## 2023-10-05 DIAGNOSIS — R600 Localized edema: Secondary | ICD-10-CM | POA: Insufficient documentation

## 2023-10-05 DIAGNOSIS — Z91010 Allergy to peanuts: Secondary | ICD-10-CM | POA: Diagnosis not present

## 2023-10-05 DIAGNOSIS — Z79899 Other long term (current) drug therapy: Secondary | ICD-10-CM | POA: Insufficient documentation

## 2023-10-05 DIAGNOSIS — I120 Hypertensive chronic kidney disease with stage 5 chronic kidney disease or end stage renal disease: Secondary | ICD-10-CM | POA: Diagnosis not present

## 2023-10-05 DIAGNOSIS — Z992 Dependence on renal dialysis: Secondary | ICD-10-CM | POA: Insufficient documentation

## 2023-10-05 LAB — BASIC METABOLIC PANEL WITH GFR
Anion gap: 14 (ref 5–15)
BUN: 88 mg/dL — ABNORMAL HIGH (ref 8–23)
CO2: 23 mmol/L (ref 22–32)
Calcium: 9.2 mg/dL (ref 8.9–10.3)
Chloride: 99 mmol/L (ref 98–111)
Creatinine, Ser: 5.33 mg/dL — ABNORMAL HIGH (ref 0.44–1.00)
GFR, Estimated: 8 mL/min — ABNORMAL LOW (ref >60)
Glucose, Bld: 97 mg/dL (ref 70–99)
Potassium: 3.7 mmol/L (ref 3.5–5.1)
Sodium: 136 mmol/L (ref 135–145)

## 2023-10-05 LAB — CBC
HCT: 24.1 % — ABNORMAL LOW (ref 36.0–46.0)
Hemoglobin: 7.3 g/dL — ABNORMAL LOW (ref 12.0–15.0)
MCH: 28.3 pg (ref 26.0–34.0)
MCHC: 30.3 g/dL (ref 30.0–36.0)
MCV: 93.4 fL (ref 80.0–100.0)
Platelets: 276 K/uL (ref 150–400)
RBC: 2.58 MIL/uL — ABNORMAL LOW (ref 3.87–5.11)
RDW: 16.4 % — ABNORMAL HIGH (ref 11.5–15.5)
WBC: 8.2 K/uL (ref 4.0–10.5)
nRBC: 0 % (ref 0.0–0.2)

## 2023-10-05 LAB — BRAIN NATRIURETIC PEPTIDE: B Natriuretic Peptide: 179.3 pg/mL — ABNORMAL HIGH (ref 0.0–100.0)

## 2023-10-05 MED ORDER — ANTICOAGULANT SODIUM CITRATE 4% (200MG/5ML) IV SOLN: 5.0000 mL | Status: DC | PRN

## 2023-10-05 MED ORDER — LIDOCAINE-PRILOCAINE 2.5-2.5 % EX CREA: 1.0000 | TOPICAL_CREAM | CUTANEOUS | Status: DC | PRN

## 2023-10-05 MED ORDER — CHLORHEXIDINE GLUCONATE CLOTH 2 % EX PADS: 6.0000 | MEDICATED_PAD | Freq: Every day | CUTANEOUS | Status: DC

## 2023-10-05 MED ORDER — DARBEPOETIN ALFA 100 MCG/0.5ML IJ SOSY
100.0000 ug | PREFILLED_SYRINGE | Freq: Once | INTRAMUSCULAR | Status: AC
  Administered 2023-10-06: 100 ug via SUBCUTANEOUS
  Filled 2023-10-05: qty 0.5

## 2023-10-05 MED ORDER — LIDOCAINE HCL (PF) 1 % IJ SOLN: 5.0000 mL | INTRAMUSCULAR | Status: DC | PRN

## 2023-10-05 MED ORDER — ALTEPLASE 2 MG IJ SOLR: 2.0000 mg | Freq: Once | INTRAMUSCULAR | Status: DC | PRN

## 2023-10-05 MED ORDER — PENTAFLUOROPROP-TETRAFLUOROETH EX AERO: 1.0000 | INHALATION_SPRAY | CUTANEOUS | Status: DC | PRN

## 2023-10-05 MED ORDER — HEPARIN SODIUM (PORCINE) 1000 UNIT/ML DIALYSIS: 1000.0000 [IU] | INTRAMUSCULAR | Status: DC | PRN

## 2023-10-05 NOTE — ED Provider Notes (Signed)
 Tiffany [name]   CSN: [id number] Arrival date & time: 10/05/23  1700     Patient presents with: Shortness of Breath and Request Dialysis   Tiffany [name] is a 63 y.o. female history of ESRD on dialysis, hypertension, here presenting with needing dialysis.  Patient's last dialysis was a week ago.  Patient told me that she has no dialysis center to go to.  Patient apparently did not like her dialysis center so requested to go to a different facility but was unable to get set up with dialysis.  Patient was here 4 days ago but left before getting dialysis.  Her last dialysis was about a week ago.  Patient states that she feels short of breath.  She also had weight gain as well.   The history is provided by the patient.       Prior to Admission medications   Medication Sig Start Date End Date Taking? Authorizing Provider  amLODipine  (NORVASC ) 5 MG tablet Take 1 tablet (5 mg total) by mouth 2 (two) times daily. 09/06/23   Donnelly Mellow, MD  atorvastatin  (LIPITOR ) 20 MG tablet Take 1 tablet (20 mg total) by mouth daily. 07/07/23 10/05/23  [place], Eric J, DO  calcitRIOL  (ROCALTROL ) 0.5 MCG capsule Take 1 capsule (0.5 mcg total) by mouth daily. 07/07/23   [place], Camellia PARAS, DO  carvedilol  (COREG ) 12.5 MG tablet Take 1 tablet (12.5 mg total) by mouth 2 (two) times daily. 07/07/23 10/05/23  [place], Camellia PARAS, DO  docusate sodium  (COLACE) 100 MG capsule Take 1 capsule (100 mg total) by mouth daily. 09/07/23   Jadapalle, Sree, MD  DULoxetine  (CYMBALTA ) 30 MG capsule Take 1 capsule (30 mg total) by mouth daily. 09/06/23   Donnelly Mellow, MD  epoetin  alfa (EPOGEN ) 10000 UNIT/ML injection Inject 1 mL (10,000 Units total) into the vein every Monday, Wednesday, and Friday with hemodialysis. 11/22/22   Cam Charlie Loving, DO  escitalopram  (LEXAPRO ) 10 MG tablet Take 1 tablet (10 mg total) by mouth daily. 09/06/23 12/05/23  Jadapalle, Sree, MD  ezetimibe   (ZETIA ) 10 MG tablet Take 1 tablet (10 mg total) by mouth daily. 09/07/23   Donnelly Mellow, MD  ferrous sulfate  325 (65 FE) MG tablet Take 1 tablet (325 mg total) by mouth daily. 07/07/23 07/01/24  [place], Eric J, DO  Fluticasone -Umeclidin-Vilant (TRELEGY ELLIPTA ) 100-62.5-25 MCG/ACT AEPB Inhale 1 puff into the lungs daily. 10/02/23   Doretha Folks, MD  folic acid  (FOLVITE ) 1 MG tablet Take 1 tablet (1 mg total) by mouth daily. 07/07/23   [place], Camellia PARAS, DO  Melatonin 10 MG TBDP Take 1 tablet (10 mg) by mouth at bedtime. 09/06/23   Donnelly Mellow, MD  metolazone  (ZAROXOLYN ) 2.5 MG tablet Take 1 tablet (2.5 mg total) by mouth every Monday, Wednesday, and Friday. 07/09/23 10/07/23  [place], Camellia PARAS, DO  omeprazole  (PRILOSEC) 40 MG capsule Take 1 capsule (40 mg total) by mouth every morning. 07/07/23 10/05/23  [place], Camellia PARAS, DO  pantoprazole  (PROTONIX ) 40 MG tablet Take 1 tablet (40 mg total) by mouth daily. 09/07/23   Donnelly Mellow, MD  potassium chloride  SA (KLOR-CON  M) 20 MEQ tablet Take 1 tablet (20 mEq total) by mouth 2 (two) times daily. 09/06/23   Donnelly Mellow, MD  prazosin  (MINIPRESS ) 2 MG capsule Take 1 capsule (2 mg total) by mouth at bedtime. Patient not taking: Reported on 08/14/2023 02/26/23     torsemide  40 MG TABS Take 40 mg by mouth 2 (  two) times daily. 08/04/23   Amponsah, Prosper M, MD    Allergies: Asa [aspirin ], Deltasone  Averi.Bjork ], Latuda [lurasidone hcl], Lortab [hydrocodone -acetaminophen ], Magnesium -containing compounds, Ultram  [tramadol ], Codeine, Desyrel  [trazodone ], Peanut (diagnostic), Apresoline  [hydralazine ], Sulfa antibiotics, Tape, and Topamax  [topiramate ]    Review of Systems  Respiratory:  Positive for shortness of breath.   All other systems reviewed and are negative.   Updated Vital Signs BP 131/73   Pulse 78   Temp 99.5 F (37.5 C)   Resp 18   SpO2 93%   Physical Exam Vitals and nursing [name] reviewed.  Constitutional:      Comments: Chronically  ill-appearing  HENT:     Head: Normocephalic.     Mouth/Throat:     Mouth: Mucous membranes are moist.  Eyes:     Extraocular Movements: Extraocular movements intact.     Pupils: Pupils are equal, round, and reactive to light.  Cardiovascular:     Rate and Rhythm: Normal rate and regular rhythm.  Pulmonary:     Comments: Diminished bilaterally. Musculoskeletal:     Cervical back: Normal range of motion.     Comments: 1+ edema bilaterally  Skin:    General: Skin is warm.  Neurological:     General: No focal deficit present.     Mental Status: [name] oriented to person, place, and time.  Psychiatric:        Mood and Affect: Mood normal.        Behavior: Behavior normal.     (all labs ordered are listed, but only abnormal results are displayed) Labs Reviewed  BASIC METABOLIC PANEL WITH GFR - Abnormal; Notable for the following components:      Result Value   BUN 88 (*)    Creatinine, Ser 5.33 (*)    GFR, Estimated 8 (*)    All other components within normal limits  CBC - Abnormal; Notable for the following components:   RBC 2.58 (*)    Hemoglobin 7.3 (*)    HCT 24.1 (*)    RDW 16.4 (*)    All other components within normal limits  BRAIN NATRIURETIC PEPTIDE - Abnormal; Notable for the following components:   B Natriuretic Peptide 179.3 (*)    All other components within normal limits    EKG: None  Radiology: DG Chest 2 View Result Date: 10/05/2023 CLINICAL DATA:  Shortness of breath.  Patient on dialysis. EXAM: CHEST - 2 VIEW COMPARISON:  10/01/2023 FINDINGS: Right IJ dialysis catheter unchanged. Lungs are somewhat hypoinflated without acute airspace consolidation or effusion. Cardiomediastinal silhouette and remainder of the exam is unchanged. IMPRESSION: No active cardiopulmonary disease. Electronically Signed   By: Toribio Agreste M.D.   On: 10/05/2023 17:52     Procedures   Medications Ordered in the ED - No data to display                                   Medical Decision Making Tiffany [name] is a 63 y.o. female here presenting with shortness of breath.  Patient was last dialyzed about a week ago.  Patient states that she has no dialysis center to go to.  I discussed with Dr. Jerrye from nephrology.  She will get patient dialyzed tonight.  Anticipate patient will be able to go home afterwards.  She states that the renal coordinator is well aware of her situation.  She did not like her dialysis center and  is in the process of getting into another dialysis center.   Amount and/or Complexity of Data Reviewed Labs: ordered. Radiology: ordered.     Final diagnoses:  None    ED Discharge Orders     None          Patt Alm Macho, MD 10/05/23 2204

## 2023-10-05 NOTE — ED Triage Notes (Signed)
 Pt came in via POV requesting dialysis, endorses some SOB since she left before her Tx last time she was here. A/Ox4, denies any acute pain.

## 2023-10-06 MED ORDER — HEPARIN SODIUM (PORCINE) 1000 UNIT/ML IJ SOLN
INTRAMUSCULAR | Status: AC
  Filled 2023-10-06: qty 1

## 2023-10-06 NOTE — ED Notes (Signed)
 [facility] called

## 2023-10-06 NOTE — ED Provider Notes (Signed)
 4:52 AM I assumed care of the patient upon return from dialysis.  [name] feeling better would like to go home.    Emil Share, DO 10/06/23 [phone]

## 2023-10-06 NOTE — ED Notes (Signed)
 [facility] canceled per Roddarria

## 2023-10-06 NOTE — Discharge Instructions (Addendum)
 Call your nephrologist and asked them where you can go for routine dialysis.  If you do need to come here to get it done temporarily then please return to the ED.  Return especially if you develop significant swelling and difficulty breathing.

## 2023-10-11 ENCOUNTER — Other Ambulatory Visit: Payer: Self-pay

## 2023-10-11 ENCOUNTER — Emergency Department (HOSPITAL_COMMUNITY)
Admission: EM | Admit: 2023-10-11 | Discharge: 2023-10-12 | Disposition: A | Discharge status: Home / Self Care | Attending: Emergency Medicine | Admitting: Emergency Medicine

## 2023-10-11 DIAGNOSIS — N186 End stage renal disease: Secondary | ICD-10-CM | POA: Diagnosis not present

## 2023-10-11 DIAGNOSIS — I120 Hypertensive chronic kidney disease with stage 5 chronic kidney disease or end stage renal disease: Secondary | ICD-10-CM | POA: Diagnosis present

## 2023-10-11 DIAGNOSIS — Z79899 Other long term (current) drug therapy: Secondary | ICD-10-CM | POA: Insufficient documentation

## 2023-10-11 DIAGNOSIS — Z992 Dependence on renal dialysis: Secondary | ICD-10-CM | POA: Insufficient documentation

## 2023-10-11 DIAGNOSIS — R2243 Localized swelling, mass and lump, lower limb, bilateral: Secondary | ICD-10-CM | POA: Insufficient documentation

## 2023-10-11 LAB — CBC WITH DIFFERENTIAL/PLATELET
Abs Immature Granulocytes: 0.08 K/uL — ABNORMAL HIGH (ref 0.00–0.07)
Basophils Absolute: 0 K/uL (ref 0.0–0.1)
Basophils Relative: 0 %
Eosinophils Absolute: 0.2 K/uL (ref 0.0–0.5)
Eosinophils Relative: 2 %
HCT: 23.7 % — ABNORMAL LOW (ref 36.0–46.0)
Hemoglobin: 7.4 g/dL — ABNORMAL LOW (ref 12.0–15.0)
Immature Granulocytes: 1 %
Lymphocytes Relative: 13 %
Lymphs Abs: 1.4 K/uL (ref 0.7–4.0)
MCH: 28.2 pg (ref 26.0–34.0)
MCHC: 31.2 g/dL (ref 30.0–36.0)
MCV: 90.5 fL (ref 80.0–100.0)
Monocytes Absolute: 0.8 K/uL (ref 0.1–1.0)
Monocytes Relative: 8 %
Neutro Abs: 7.6 K/uL (ref 1.7–7.7)
Neutrophils Relative %: 76 %
Platelets: 256 K/uL (ref 150–400)
RBC: 2.62 MIL/uL — ABNORMAL LOW (ref 3.87–5.11)
RDW: 16.2 % — ABNORMAL HIGH (ref 11.5–15.5)
WBC: 10.1 K/uL (ref 4.0–10.5)
nRBC: 0 % (ref 0.0–0.2)

## 2023-10-11 LAB — BASIC METABOLIC PANEL WITH GFR
Anion gap: 16 — ABNORMAL HIGH (ref 5–15)
BUN: 101 mg/dL — ABNORMAL HIGH (ref 8–23)
CO2: 21 mmol/L — ABNORMAL LOW (ref 22–32)
Calcium: 9.1 mg/dL (ref 8.9–10.3)
Chloride: 97 mmol/L — ABNORMAL LOW (ref 98–111)
Creatinine, Ser: 6.11 mg/dL — ABNORMAL HIGH (ref 0.44–1.00)
GFR, Estimated: 7 mL/min — ABNORMAL LOW (ref >60)
Glucose, Bld: 94 mg/dL (ref 70–99)
Potassium: 3.1 mmol/L — ABNORMAL LOW (ref 3.5–5.1)
Sodium: 134 mmol/L — ABNORMAL LOW (ref 135–145)

## 2023-10-11 MED ORDER — CHLORHEXIDINE GLUCONATE CLOTH 2 % EX PADS: 6.0000 | MEDICATED_PAD | Freq: Every day | CUTANEOUS | Status: DC

## 2023-10-11 NOTE — ED Triage Notes (Signed)
 PT present POV requesting dialysis, last dialysis was last Friday 6 days ago and endoreses BLE swelling. A&Ox4 denies any pain

## 2023-10-11 NOTE — ED Notes (Signed)
 Pt urinated on self, pt clothing changed and pericare completed. New bedding changed, patient repositioned in bed.

## 2023-10-11 NOTE — Progress Notes (Signed)
 Noted pt currently in the ED. Pt refuses to return to [facility] [place] and there are currently no other [facility] clinics in [place] able to accept pt at this time. Navigator was advised yesterday by [facility] admissions that pt was denied by area [facility] clinics as well. Update provided to nephrologist and renal PA.   Randine Mungo Dialysis Navigator [phone]  Addendum on 10/12/23 at 10:00 am: Attempted to reach pt via phone but unable to reach pt. Message left requesting a return call.

## 2023-10-11 NOTE — ED Notes (Signed)
 Pt to registration desk asking to speak to a Child psychotherapist. Explained to patient that once she is in the back and seen by a provider, she can express her additional needs. Pt then began asking where the cafeteria is and the hours. Encouraged patient to wait to be seen. Explained to patient that if her name is called and she is not here, she would have to start the process and her wait time over. Verbalizes understanding.

## 2023-10-11 NOTE — ED Provider Notes (Signed)
 [name] EMERGENCY DEPARTMENT AT [facility] Provider Note   CSN: [id number] Arrival date & time: 10/11/23  1018     Patient presents with: No chief complaint on file.   Tiffany [name] is a 63 y.o. female.  HPI   Patient is a 63 year old female with PMH of ESRD on HD and HTN, presenting to the ED via POV with chief complaint of missed dialysis with last dialysis session 09/19.  Patient endorsing bilateral lower extremity swelling.  Patient reports that she presented for dialysis to the ED 09/22, however, things were  too slow therefore she left.  She reports that she is unable to go to her prior dialysis center secondary to transport limitations.  Patient reports that she was able to come to the ED today public transport.   Patient denies any chest pain, shortness of breath, fevers, chills, nausea, vomiting, abdominal pain, changes in urination, or changes in bowel movements.  Prior to Admission medications   Medication Sig Start Date End Date Taking? Authorizing Provider  amLODipine  (NORVASC ) 5 MG tablet Take 1 tablet (5 mg total) by mouth 2 (two) times daily. 09/06/23   Jadapalle, Sree, MD  atorvastatin  (LIPITOR ) 20 MG tablet Take 1 tablet (20 mg total) by mouth daily. 07/07/23 10/05/23  [place], Camellia PARAS, DO  calcitRIOL  (ROCALTROL ) 0.5 MCG capsule Take 1 capsule (0.5 mcg total) by mouth daily. 07/07/23   [place], Camellia PARAS, DO  carvedilol  (COREG ) 12.5 MG tablet Take 1 tablet (12.5 mg total) by mouth 2 (two) times daily. 07/07/23 10/05/23  [place], Camellia PARAS, DO  docusate sodium  (COLACE) 100 MG capsule Take 1 capsule (100 mg total) by mouth daily. 09/07/23   Jadapalle, Sree, MD  DULoxetine  (CYMBALTA ) 30 MG capsule Take 1 capsule (30 mg total) by mouth daily. 09/06/23   Jadapalle, Sree, MD  epoetin  alfa (EPOGEN ) 10000 UNIT/ML injection Inject 1 mL (10,000 Units total) into the vein every Monday, Wednesday, and Friday with hemodialysis. 11/22/22   Cam Charlie Loving, DO  escitalopram   (LEXAPRO ) 10 MG tablet Take 1 tablet (10 mg total) by mouth daily. 09/06/23 12/05/23  Jadapalle, Sree, MD  ezetimibe  (ZETIA ) 10 MG tablet Take 1 tablet (10 mg total) by mouth daily. 09/07/23   Donnelly Mellow, MD  ferrous sulfate  325 (65 FE) MG tablet Take 1 tablet (325 mg total) by mouth daily. 07/07/23 07/01/24  [place], Eric J, DO  Fluticasone -Umeclidin-Vilant (TRELEGY ELLIPTA ) 100-62.5-25 MCG/ACT AEPB Inhale 1 puff into the lungs daily. 10/02/23   Doretha Folks, MD  folic acid  (FOLVITE ) 1 MG tablet Take 1 tablet (1 mg total) by mouth daily. 07/07/23   [place], Camellia PARAS, DO  Melatonin 10 MG TBDP Take 1 tablet (10 mg) by mouth at bedtime. 09/06/23   Donnelly Mellow, MD  metolazone  (ZAROXOLYN ) 2.5 MG tablet Take 1 tablet (2.5 mg total) by mouth every Monday, Wednesday, and Friday. 07/09/23 10/07/23  [place], Camellia PARAS, DO  omeprazole  (PRILOSEC) 40 MG capsule Take 1 capsule (40 mg total) by mouth every morning. 07/07/23 10/05/23  [place], Camellia PARAS, DO  pantoprazole  (PROTONIX ) 40 MG tablet Take 1 tablet (40 mg total) by mouth daily. 09/07/23   Donnelly Mellow, MD  potassium chloride  SA (KLOR-CON  M) 20 MEQ tablet Take 1 tablet (20 mEq total) by mouth 2 (two) times daily. 09/06/23   Donnelly Mellow, MD  prazosin  (MINIPRESS ) 2 MG capsule Take 1 capsule (2 mg total) by mouth at bedtime. Patient not taking: Reported on 08/14/2023 02/26/23     torsemide  40  MG TABS Take 40 mg by mouth 2 (two) times daily. 08/04/23   Lou Claretta HERO, MD    Allergies: Asa [aspirin ], Deltasone  [prednisone ], Latuda [lurasidone hcl], Lortab [hydrocodone -acetaminophen ], Magnesium -containing compounds, Ultram  [tramadol ], Codeine, Desyrel  [trazodone ], Peanut (diagnostic), Apresoline  [hydralazine ], Sulfa antibiotics, Tape, and Topamax  [topiramate ]    Review of Systems  Updated Vital Signs BP (!) 145/85 (BP Location: Right Arm)   Pulse 74   Temp 98.1 F (36.7 C)   Resp 14   Ht 5' 3 (1.6 m)   Wt 90 kg   SpO2 100%   BMI 35.15 kg/m    Physical Exam Constitutional:      Appearance: Normal appearance.  HENT:     Head: Normocephalic and atraumatic.     Right Ear: Ear canal and external ear normal.     Left Ear: Ear canal and external ear normal.     Nose: Nose normal.     Mouth/Throat:     Mouth: Mucous membranes are moist.     Pharynx: Oropharynx is clear.  Eyes:     Extraocular Movements: Extraocular movements intact.     Pupils: Pupils are equal, round, and reactive to light.  Cardiovascular:     Rate and Rhythm: Normal rate and regular rhythm.     Pulses: Normal pulses.     Heart sounds: Normal heart sounds.  Pulmonary:     Effort: Pulmonary effort is normal.     Breath sounds: Normal breath sounds.  Abdominal:     General: Abdomen is flat.     Palpations: Abdomen is soft.  Musculoskeletal:     Cervical back: Normal range of motion.     Right lower leg: Edema present.     Left lower leg: Edema present.  Skin:    General: Skin is warm.     Capillary Refill: Capillary refill takes less than 2 seconds.  Neurological:     General: No focal deficit present.     Mental Status: [name] alert and oriented to person, place, and time.     (all labs ordered are listed, but only abnormal results are displayed) Labs Reviewed  BASIC METABOLIC PANEL WITH GFR - Abnormal; Notable for the following components:      Result Value   Sodium 134 (*)    Potassium 3.1 (*)    Chloride 97 (*)    CO2 21 (*)    BUN 101 (*)    Creatinine, Ser 6.11 (*)    GFR, Estimated 7 (*)    Anion gap 16 (*)    All other components within normal limits  CBC WITH DIFFERENTIAL/PLATELET - Abnormal; Notable for the following components:   RBC 2.62 (*)    Hemoglobin 7.4 (*)    HCT 23.7 (*)    RDW 16.2 (*)    Abs Immature Granulocytes 0.08 (*)    All other components within normal limits    EKG: EKG Interpretation Date/Time:  Thursday October 11 2023 19:40:50 EDT Ventricular Rate:  75 PR Interval:  154 QRS Duration:  82 QT  Interval:  430 QTC Calculation: 480 R Axis:   63  Text Interpretation: Normal sinus rhythm Artifact Baseline wander Confirmed by Garrick Charleston [phone]) on 10/11/2023 8:22:28 PM  Radiology: No results found.   Procedures   Medications Ordered in the ED  Chlorhexidine  Gluconate Cloth 2 % PADS 6 each (has no administration in time range)    Clinical Course as of 10/11/23 2344  Thu Oct 11, 2023  2313 Pending  dialysis --> D/c [WB]    Clinical Course User Index [WB] Armina Galloway, Elsie, MD   Medical Decision Making Amount and/or Complexity of Data Reviewed Labs: ordered.  Risk Decision regarding hospitalization.   63 year old female with PMH as above presenting to the ED for HD in the setting of missed dialysis with last HD session 09/19.  No acute somatic complaints.  No acute infectious symptoms.  Upon arrival, patient hemodynamically stable no acute distress.  Normotensive.  Afebrile.  No tachycardia or tachypnea.  Saturating 96% on RA.  GCS 15.  Physical examination largely unremarkable with trace pitting edema bilateral lower extremities.  Patient nontoxic-appearing and resting comfortably.  Metabolic panel significant for creatinine 6.11/BUN 101.  CBC without leukocytosis.  Orders for dialysis placed.  On reassessment, patient resting comfortably with no acute somatic complaints.  At time of signout, final disposition pending, though anticipate dialysis and likely discharge.  Plan of care discussed with oncoming ED attending and ED APP.  Patient updated regarding plan of care.  Final diagnoses:  Dialysis patient    ED Discharge Orders     None          Andalyn Heckstall, Elsie, MD 10/11/23 2344    Garrick Charleston, MD 10/12/23 2200

## 2023-10-11 NOTE — ED Provider Triage Note (Signed)
 Emergency Medicine Provider Triage Evaluation Note  KARRINE KLUTTZ , a 63 y.o. female  was evaluated in triage.  Pt complains of need for dialysis.  Also complaining of lower extremity swelling and discomfort.  She comes t regularly for dialysis to the hospital..  Review of Systems  Positive: Swelling and shortness of breath Negative: Fever  Physical Exam  BP 137/87   Pulse 77   Temp 98.1 F (36.7 C)   Resp 20   Ht 5' 3 (1.6 m)   Wt 90 kg   SpO2 98%   BMI 35.15 kg/m  Gen:   Awake, no distress   Resp:  Normal effort  MSK:   Moves extremities without difficulty  Other:    Medical Decision Making  Medically screening exam initiated at 6:21 PM.  Appropriate orders placed.  EVAMARIA DETORE was informed that the remainder of the evaluation will be completed by another provider, this initial triage assessment does not replace that evaluation, and the importance of remaining in the ED until their evaluation is complete.     Arloa Chroman, PA-C 10/11/23 TRENNA

## 2023-10-11 NOTE — ED Notes (Signed)
 Called patient X 1 for MSE. No answer.

## 2023-10-11 NOTE — ED Notes (Signed)
 Pt provided sandwich bag and drink

## 2023-10-11 NOTE — ED Notes (Signed)
 Pt to registration desk again, stating, I'm not gonna be seen today so I may as well just leave. Encouraged patient to stay to be seen by provider. Witnessed patient leave ED via personal electric wheelchair.

## 2023-10-12 MED ORDER — HEPARIN SODIUM (PORCINE) 1000 UNIT/ML IJ SOLN
INTRAMUSCULAR | Status: AC
  Filled 2023-10-12: qty 2

## 2023-10-12 NOTE — ED Notes (Signed)
 Called  [facility] FOR PT PICK ETA 15 MINUTE

## 2023-10-12 NOTE — ED Notes (Signed)
 This RN has attempted to call dialysis multiple times to give report and have pt go up for dialysis tx

## 2023-10-18 ENCOUNTER — Other Ambulatory Visit: Payer: Self-pay

## 2023-10-18 ENCOUNTER — Emergency Department (HOSPITAL_COMMUNITY)

## 2023-10-18 ENCOUNTER — Emergency Department (HOSPITAL_COMMUNITY)
Admission: EM | Admit: 2023-10-18 | Discharge: 2023-10-18 | Disposition: A | Discharge status: Home / Self Care | Attending: Emergency Medicine | Admitting: Emergency Medicine

## 2023-10-18 ENCOUNTER — Encounter (HOSPITAL_COMMUNITY): Payer: Self-pay

## 2023-10-18 DIAGNOSIS — I509 Heart failure, unspecified: Secondary | ICD-10-CM | POA: Insufficient documentation

## 2023-10-18 DIAGNOSIS — Z8673 Personal history of transient ischemic attack (TIA), and cerebral infarction without residual deficits: Secondary | ICD-10-CM | POA: Insufficient documentation

## 2023-10-18 DIAGNOSIS — N186 End stage renal disease: Secondary | ICD-10-CM | POA: Diagnosis not present

## 2023-10-18 DIAGNOSIS — I132 Hypertensive heart and chronic kidney disease with heart failure and with stage 5 chronic kidney disease, or end stage renal disease: Secondary | ICD-10-CM | POA: Insufficient documentation

## 2023-10-18 DIAGNOSIS — Z79899 Other long term (current) drug therapy: Secondary | ICD-10-CM | POA: Insufficient documentation

## 2023-10-18 DIAGNOSIS — E1122 Type 2 diabetes mellitus with diabetic chronic kidney disease: Secondary | ICD-10-CM | POA: Diagnosis not present

## 2023-10-18 DIAGNOSIS — J45909 Unspecified asthma, uncomplicated: Secondary | ICD-10-CM | POA: Insufficient documentation

## 2023-10-18 DIAGNOSIS — R0602 Shortness of breath: Secondary | ICD-10-CM | POA: Diagnosis present

## 2023-10-18 DIAGNOSIS — Z992 Dependence on renal dialysis: Secondary | ICD-10-CM | POA: Insufficient documentation

## 2023-10-18 DIAGNOSIS — Z91010 Allergy to peanuts: Secondary | ICD-10-CM | POA: Diagnosis not present

## 2023-10-18 DIAGNOSIS — E876 Hypokalemia: Secondary | ICD-10-CM | POA: Diagnosis not present

## 2023-10-18 LAB — BASIC METABOLIC PANEL WITH GFR
Anion gap: 15 (ref 5–15)
BUN: 84 mg/dL — ABNORMAL HIGH (ref 8–23)
CO2: 21 mmol/L — ABNORMAL LOW (ref 22–32)
Calcium: 9.7 mg/dL (ref 8.9–10.3)
Chloride: 102 mmol/L (ref 98–111)
Creatinine, Ser: 6.4 mg/dL — ABNORMAL HIGH (ref 0.44–1.00)
GFR, Estimated: 7 mL/min — ABNORMAL LOW (ref >60)
Glucose, Bld: 73 mg/dL (ref 70–99)
Potassium: 3.3 mmol/L — ABNORMAL LOW (ref 3.5–5.1)
Sodium: 138 mmol/L (ref 135–145)

## 2023-10-18 LAB — CBC
HCT: 31.4 % — ABNORMAL LOW (ref 36.0–46.0)
Hemoglobin: 9.1 g/dL — ABNORMAL LOW (ref 12.0–15.0)
MCH: 27.7 pg (ref 26.0–34.0)
MCHC: 29 g/dL — ABNORMAL LOW (ref 30.0–36.0)
MCV: 95.7 fL (ref 80.0–100.0)
Platelets: 254 K/uL (ref 150–400)
RBC: 3.28 MIL/uL — ABNORMAL LOW (ref 3.87–5.11)
RDW: 16.7 % — ABNORMAL HIGH (ref 11.5–15.5)
WBC: 8.6 K/uL (ref 4.0–10.5)
nRBC: 0 % (ref 0.0–0.2)

## 2023-10-18 LAB — I-STAT CHEM 8, ED
BUN: 79 mg/dL — ABNORMAL HIGH (ref 8–23)
Calcium, Ion: 1.1 mmol/L — ABNORMAL LOW (ref 1.15–1.40)
Chloride: 104 mmol/L (ref 98–111)
Creatinine, Ser: 7.2 mg/dL — ABNORMAL HIGH (ref 0.44–1.00)
Glucose, Bld: 82 mg/dL (ref 70–99)
HCT: 30 % — ABNORMAL LOW (ref 36.0–46.0)
Hemoglobin: 10.2 g/dL — ABNORMAL LOW (ref 12.0–15.0)
Potassium: 3.5 mmol/L (ref 3.5–5.1)
Sodium: 138 mmol/L (ref 135–145)
TCO2: 23 mmol/L (ref 22–32)

## 2023-10-18 LAB — HEPATITIS B SURFACE ANTIGEN: Hepatitis B Surface Ag: NONREACTIVE

## 2023-10-18 LAB — RENAL FUNCTION PANEL
Albumin: 3.2 g/dL — ABNORMAL LOW (ref 3.5–5.0)
Anion gap: 9 (ref 5–15)
BUN: 47 mg/dL — ABNORMAL HIGH (ref 8–23)
CO2: 27 mmol/L (ref 22–32)
Calcium: 9 mg/dL (ref 8.9–10.3)
Chloride: 100 mmol/L (ref 98–111)
Creatinine, Ser: 3.92 mg/dL — ABNORMAL HIGH (ref 0.44–1.00)
GFR, Estimated: 12 mL/min — ABNORMAL LOW (ref >60)
Glucose, Bld: 94 mg/dL (ref 70–99)
Phosphorus: 2.7 mg/dL (ref 2.5–4.6)
Potassium: 3.2 mmol/L — ABNORMAL LOW (ref 3.5–5.1)
Sodium: 136 mmol/L (ref 135–145)

## 2023-10-18 MED ORDER — HEPARIN SODIUM (PORCINE) 1000 UNIT/ML DIALYSIS
1000.0000 [IU] | INTRAMUSCULAR | Status: DC | PRN
  Administered 2023-10-18: 3800 [IU]
  Filled 2023-10-18: qty 1

## 2023-10-18 MED ORDER — CHLORHEXIDINE GLUCONATE CLOTH 2 % EX PADS
6.0000 | MEDICATED_PAD | Freq: Every day | CUTANEOUS | Status: DC
Start: 2023-10-18 — End: 2023-10-18

## 2023-10-18 MED ORDER — HEPARIN SODIUM (PORCINE) 1000 UNIT/ML IJ SOLN
INTRAMUSCULAR | Status: AC
  Filled 2023-10-18: qty 4

## 2023-10-18 MED ORDER — PENTAFLUOROPROP-TETRAFLUOROETH EX AERO: 1.0000 | INHALATION_SPRAY | CUTANEOUS | Status: DC | PRN

## 2023-10-18 NOTE — Progress Notes (Signed)
 Navigator received a call from pt this morning. Discussed with pt the option of returning to [facility] for out-pt HD since pt was not able to be accepted at any other local [facility] clinic. Pt was also denied by [facility] for area clinics as well. Pt now agreeable to resume care at [facility] on Tuesday, Oct 7. Pt's schedule will be TTS 11:30 am chair time. Pt advised to arrive at 11:10 am starting on Tues. HD info provided to pt via text per pt's request and info added to AVS as well. Clinic advised pt agreeable to this schedule and to expect pt on Tuesday. Navigator will send needed clinicals to clinic once available since pt has not treated at clinic since 08/07/23. Update provided to ED provider, nephrologist, and renal PA.   Randine Mungo Dialysis Navigator [phone]

## 2023-10-18 NOTE — ED Provider Notes (Signed)
 [place] EMERGENCY DEPARTMENT AT [facility] Provider Note   CSN: [id number] Arrival date & time: 10/18/23  9270     Patient presents with: No chief complaint on file.   Tiffany [name] is a 63 y.o. female.  HPI Patient is a 63 year old female with extensive medical history of ESRD on dialysis, HTN, respiratory failure, emphysema, stroke, QT prolongation, schizoaffective disorder presenting to ED today for concerns of needing dialysis.  With last dialysis noted to be 1 week ago.  Notes that she is not able to go anywhere else for dialysis and has not had dialysis since due to wait times in the emergency room.  Noting that she has mild shortness of breath that is present when she goes longer periods without dialysis.  Denies fever, headache, vision changes, chest pain,, cough, congestion, abdominal pain, nausea, vomiting.    Prior to Admission medications   Medication Sig Start Date End Date Taking? Authorizing Provider  amLODipine  (NORVASC ) 5 MG tablet Take 1 tablet (5 mg total) by mouth 2 (two) times daily. 09/06/23   Donnelly Mellow, MD  atorvastatin  (LIPITOR ) 20 MG tablet Take 1 tablet (20 mg total) by mouth daily. 07/07/23 10/05/23  [place], Eric J, DO  calcitRIOL  (ROCALTROL ) 0.5 MCG capsule Take 1 capsule (0.5 mcg total) by mouth daily. 07/07/23   [place], Camellia PARAS, DO  carvedilol  (COREG ) 12.5 MG tablet Take 1 tablet (12.5 mg total) by mouth 2 (two) times daily. 07/07/23 10/05/23  [place], Camellia PARAS, DO  docusate sodium  (COLACE) 100 MG capsule Take 1 capsule (100 mg total) by mouth daily. 09/07/23   Jadapalle, Sree, MD  DULoxetine  (CYMBALTA ) 30 MG capsule Take 1 capsule (30 mg total) by mouth daily. 09/06/23   Donnelly Mellow, MD  epoetin  alfa (EPOGEN ) 10000 UNIT/ML injection Inject 1 mL (10,000 Units total) into the vein every Monday, Wednesday, and Friday with hemodialysis. 11/22/22   Cam Charlie Loving, DO  escitalopram  (LEXAPRO ) 10 MG tablet Take 1 tablet (10 mg total) by  mouth daily. 09/06/23 12/05/23  Jadapalle, Sree, MD  ezetimibe  (ZETIA ) 10 MG tablet Take 1 tablet (10 mg total) by mouth daily. 09/07/23   Donnelly Mellow, MD  ferrous sulfate  325 (65 FE) MG tablet Take 1 tablet (325 mg total) by mouth daily. 07/07/23 07/01/24  [place], Eric J, DO  Fluticasone -Umeclidin-Vilant (TRELEGY ELLIPTA ) 100-62.5-25 MCG/ACT AEPB Inhale 1 puff into the lungs daily. 10/02/23   Doretha Folks, MD  folic acid  (FOLVITE ) 1 MG tablet Take 1 tablet (1 mg total) by mouth daily. 07/07/23   [place], Camellia PARAS, DO  Melatonin 10 MG TBDP Take 1 tablet (10 mg) by mouth at bedtime. 09/06/23   Donnelly Mellow, MD  metolazone  (ZAROXOLYN ) 2.5 MG tablet Take 1 tablet (2.5 mg total) by mouth every Monday, Wednesday, and Friday. 07/09/23 10/07/23  [place], Camellia PARAS, DO  omeprazole  (PRILOSEC) 40 MG capsule Take 1 capsule (40 mg total) by mouth every morning. 07/07/23 10/05/23  [place], Camellia PARAS, DO  pantoprazole  (PROTONIX ) 40 MG tablet Take 1 tablet (40 mg total) by mouth daily. 09/07/23   Donnelly Mellow, MD  potassium chloride  SA (KLOR-CON  M) 20 MEQ tablet Take 1 tablet (20 mEq total) by mouth 2 (two) times daily. 09/06/23   Donnelly Mellow, MD  prazosin  (MINIPRESS ) 2 MG capsule Take 1 capsule (2 mg total) by mouth at bedtime. Patient not taking: Reported on 08/14/2023 02/26/23     torsemide  40 MG TABS Take 40 mg by mouth 2 (two) times daily. 08/04/23  Amponsah, Prosper M, MD    Allergies: Asa [aspirin ], Deltasone  [prednisone ], Latuda [lurasidone hcl], Lortab [hydrocodone -acetaminophen ], Magnesium -containing compounds, Ultram  [tramadol ], Codeine, Desyrel  [trazodone ], Peanut (diagnostic), Apresoline  [hydralazine ], Sulfa antibiotics, Tape, and Topamax  [topiramate ]    Review of Systems  Respiratory:  Positive for shortness of breath.   All other systems reviewed and are negative.   Updated Vital Signs BP (!) 165/88 (BP Location: Right Arm)   Pulse 77   Temp 97.7 F (36.5 C)   Resp 17   Ht 5' 3 (1.6 m)    Wt 90 kg   SpO2 100%   BMI 35.15 kg/m   Physical Exam Vitals and nursing note reviewed.  Constitutional:      General: [name] not in acute distress.    Appearance: Normal appearance. [name] not ill-appearing or diaphoretic.  HENT:     Head: Normocephalic and atraumatic.  Eyes:     General: No scleral icterus.       Right eye: No discharge.        Left eye: No discharge.     Extraocular Movements: Extraocular movements intact.     Conjunctiva/sclera: Conjunctivae normal.  Cardiovascular:     Rate and Rhythm: Normal rate and regular rhythm.     Pulses: Normal pulses.     Heart sounds: Normal heart sounds. No murmur heard.    No friction rub. No gallop.  Pulmonary:     Effort: Pulmonary effort is normal. No respiratory distress.     Breath sounds: No stridor. No wheezing, rhonchi or rales.  Chest:     Chest wall: No tenderness.  Abdominal:     General: Abdomen is flat. There is no distension.     Palpations: Abdomen is soft.     Tenderness: There is no abdominal tenderness.  Musculoskeletal:        General: No swelling, deformity or signs of injury.     Cervical back: Normal range of motion. No rigidity.  Skin:    General: Skin is warm and dry.  Neurological:     General: No focal deficit present.     Mental Status: [name] alert and oriented to person, place, and time. Mental status is at baseline.  Psychiatric:        Mood and Affect: Mood normal.     (all labs ordered are listed, but only abnormal results are displayed) Labs Reviewed  BASIC METABOLIC PANEL WITH GFR - Abnormal; Notable for the following components:      Result Value   Potassium 3.3 (*)    CO2 21 (*)    BUN 84 (*)    Creatinine, Ser 6.40 (*)    GFR, Estimated 7 (*)    All other components within normal limits  CBC - Abnormal; Notable for the following components:   RBC 3.28 (*)    Hemoglobin 9.1 (*)    HCT 31.4 (*)    MCHC 29.0 (*)    RDW 16.7 (*)    All other components within normal limits   I-STAT CHEM 8, ED - Abnormal; Notable for the following components:   BUN 79 (*)    Creatinine, Ser 7.20 (*)    Calcium , Ion 1.10 (*)    Hemoglobin 10.2 (*)    HCT 30.0 (*)    All other components within normal limits  HEPATITIS B CORE ANTIBODY, TOTAL  HEPATITIS B SURFACE ANTIGEN  HEPATITIS B SURFACE ANTIBODY, QUANTITATIVE  RENAL FUNCTION PANEL  RENAL FUNCTION PANEL  CBC  EKG: EKG Interpretation Date/Time:  Thursday October 18 2023 07:47:21 EDT Ventricular Rate:  76 PR Interval:  156 QRS Duration:  80 QT Interval:  400 QTC Calculation: 450 R Axis:   6  Text Interpretation: Normal sinus rhythm Cannot rule out Anterior infarct , age undetermined Abnormal ECG When compared with ECG of 11-Oct-2023 19:40, PREVIOUS ECG IS PRESENT Confirmed by Darra Chew [phone]) on 10/18/2023 7:54:19 AM  Radiology: [facility] Chest 2 View Result Date: 10/18/2023 CLINICAL DATA:  Shortness of breath. EXAM: CHEST - 2 VIEW COMPARISON:  10/05/2023 FINDINGS: Low volume film. Cardiopericardial silhouette is at upper limits of normal for size. There is pulmonary vascular congestion without overt pulmonary edema. No focal consolidation or substantial pleural effusion. IMPRESSION: Low volume film with pulmonary vascular congestion. Electronically Signed   By: Camellia Candle M.D.   On: 10/18/2023 08:09     Procedures   Medications Ordered in the ED  Chlorhexidine  Gluconate Cloth 2 % PADS 6 each (has no administration in time range)  pentafluoroprop-tetrafluoroeth (GEBAUERS) aerosol 1 Application (has no administration in time range)  heparin  injection 1,000 Units (has no administration in time range)    Medical Decision Making Amount and/or Complexity of Data Reviewed Labs: ordered. Radiology: ordered.   This patient is a 63 year old female who presents to the ED for concern of requesting dialysis, noted to be experiencing some shortness of breath which is common for her for extended periods of missed  dialysis.  Notes that she had last full dialysis 1 week ago, missing 2 dialysis sessions in the interim secondary to not wanting to wait around forever in the emergency department.  On physical exam, patient is in no acute distress, afebrile, alert and orient x 4, speaking in full sentences, nontachypneic, nontachycardic.  LCTAB, RRR.  Unremarkable exam otherwise.  Patient is in wheelchair, well-appearing.  CBC notes improved hemoglobin with BMP near baseline with mild hypokalemia 3.3.  Chest x-ray shows some pulmonary congestion.  EKG was normal.  Low suspicion for ACS, PE, decompensated CHF.  Considered troponin, BMP, CTA, DVT study but do not believe is warranted at this time.  Spoke with nephrology who wished for her to have hep B labs.  Dr. Geralynn will orchestrate dialysis for this patient.  Nephrology will order hemodialysis and then discharge at that time.  Differential diagnoses prior to evaluation: The emergent differential diagnosis includes, but is not limited to, metabolic disturbance, CHF, pleural effusion, pneumonia, ACS, PE. This is not an exhaustive differential.   Past Medical History / Co-morbidities / Social History: Emphysema, cardiac arrest, stroke, MI, asthma, diabetic neuropathy, gout, diabetes, CHF, rhabdomyolysis, prolonged QT, and anoxic brain injury, carotid artery stenosis, chronic respiratory failure on 5 L, schizoaffective disorder  Additional history: Chart reviewed. Pertinent results include:   Last seen on 10/11/2023 for dialysis.  Noting that she had missed 1 episode of dialysis at that time due to things going too slow.  Noted that she has no other dialysis centers to go to.  Lab Tests/Imaging studies: I personally interpreted labs/imaging and the pertinent results include:   CBC notes improved hemoglobin 9.1 BMP notes near baseline I-STAT Chem-8 near baseline Hepatitis panel pending Chest x-ray shows pulmonary congestion I agree with the radiologist  interpretation.  Cardiac monitoring: EKG obtained and interpreted by myself and attending physician which shows: NSR  EKG Interpretation Date/Time:  Thursday October 18 2023 07:47:21 EDT Ventricular Rate:  76 PR Interval:  156 QRS Duration:  80 QT Interval:  400 QTC Calculation: 450  R Axis:   6  Text Interpretation: Normal sinus rhythm Cannot rule out Anterior infarct , age undetermined Abnormal ECG When compared with ECG of 11-Oct-2023 19:40, PREVIOUS ECG IS PRESENT Confirmed by Darra Chew [phone]) on 10/18/2023 7:54:19 AM          Medications:  I have reviewed the patients home medicines and have made adjustments as needed.  Critical Interventions: None  Social Determinants of Health: Does not have a dialysis center that she is readily able to go to  Disposition: After consideration of the diagnostic results and the patients response to treatment, I feel that the patient would benefit from dialysis treatment, pending discharge after dialysis.  Dr. Geralynn is managing dialysis.  Final diagnoses:  Dialysis patient  ESRD (end stage renal disease) on dialysis [facility])    ED Discharge Orders     None          Beola Terrall GORMAN DEVONNA 10/18/23 1230    Long, Joshua G, MD 10/19/23 [phone]

## 2023-10-18 NOTE — Progress Notes (Signed)
 Received patient in bed.Alert and oriented x 4.She signed her consent for treatment.  Access used: Right hd catheter that worked well.  Duration of treatment: 3.5 hours.  Ne uf 700 cc.  Hemo comment: After 2 hours she quit her treatment,She signed AMA .She was cramping a lot even uf goal is decreased a t 2 liters.  Hand off to the patient's nurse ,back to ED,for discharge with stable medical condition.

## 2023-10-18 NOTE — ED Triage Notes (Signed)
 Pt requesting dialysis, last treatment was 9/26. Pt c.o SOB

## 2023-10-18 NOTE — ED Notes (Signed)
 PT to dialysis at this time

## 2023-10-18 NOTE — ED Notes (Signed)
 Came back from dialysis no distress 1Liter removed.  Alert and oriented.  Would not wait for vitals.

## 2023-10-18 NOTE — ED Notes (Signed)
 Sprite given at pts request.

## 2023-10-18 NOTE — ED Provider Notes (Signed)
 Patient taken for hemodialysis, had hemodialysis treatment without complication, sent back to ED for discharge.  [name] normal appearing, no apparent distress.  States that she is ready to go home at this time.  As she has received definitive care, and has no further medical complaints at this time, and is stable after hemodialysis, will discharge patient.  Return precautions given to the patient, she understands and agrees.   Myriam Dorn BROCKS, PA 10/18/23 1706    Ruthe Cornet, DO 10/18/23 SHAUNNA

## 2023-10-18 NOTE — Procedures (Signed)
 Asked to see this patient for hospital dialysis. Pt has been trying to get into other HD units locally without any luck.  Our SW is working on getting her back to [facility] unit, which is where she is initially before she stopped going there. Pt in no distress. Last HD 1 wk ago.   The plan will be for ED HD. Pt is not to be admitted at this time. Pt will go to the dialysis unit when they are ready for the patient. When dialysis is completed pt will be sent back to ED for reassessment.   Vitals:   10/18/23 0742 10/18/23 1259 10/18/23 1344 10/18/23 1400  BP:  (!) 157/80 (!) 126/103 (!) 159/91  Pulse:  86 84 79  Resp:  17 16 18   Temp:  97.7 F (36.5 C) 98.3 F (36.8 C)   TempSrc:  Oral    SpO2:  100% 99% 100%  Weight: 90 kg  90 kg   Height: 5' 3 (1.6 m)          I was present at the procedure, reviewed the HD regimen and made appropriate changes.   Myer Fret MD  [facility] 10/18/2023, 2:36 PM    Recent Labs  Lab 10/11/23 1951 10/18/23 0743 10/18/23 0759  HGB 7.4* 9.1* 10.2*  CALCIUM  9.1 9.7  --   CREATININE 6.11* 6.40* 7.20*  K 3.1* 3.3* 3.5   No results for input(s): IRON , TIBC, FERRITIN in the last 168 hours. Inpatient medications:  Chlorhexidine  Gluconate Cloth  6 each Topical Q0600    heparin , pentafluoroprop-tetrafluoroeth

## 2023-10-19 LAB — HEPATITIS B CORE ANTIBODY, TOTAL: HEP B CORE AB: NEGATIVE

## 2023-10-19 LAB — HEPATITIS B SURFACE ANTIBODY, QUANTITATIVE: Hep B S AB Quant (Post): 2627 m[IU]/mL

## 2023-10-19 NOTE — Discharge Planning (Signed)
 [facility]  Hemodialysis Orders Dialysis center: [facility]   Patient's name: Tiffany [name] DOB: 07/15/1960 ESRD  Dialysis Prescription: Dialysis Frequency: three times per week  Tx duration: 4:00 BFR: 400 DFR: 600 EDW: 84 kg   Dialyzer: 180NRe UF profile/Sodium modeling?: -- Dialysis Bath: 3 K 2 Ca Heparin : None   Dialysis access: Access type: R internal jugular TDC    In Center Medications: VDRA: Calcitriol  0.5 mcg po q HD  Venofer: per protocol  Mircera: per protocol     Allergies:  Allergies  Allergen Reactions   Asa [Aspirin ] Other (See Comments)    GI bleed   Deltasone  [Prednisone ] Anaphylaxis and Swelling   Latuda [Lurasidone Hcl] Anaphylaxis   Lortab [Hydrocodone -Acetaminophen ] Hives and Shortness Of Breath   Magnesium -Containing Compounds Anaphylaxis and Other (See Comments)    Tolerated Ensure   Ultram  [Tramadol ] Anaphylaxis, Swelling and Rash   Codeine Nausea And Vomiting and Rash   Desyrel  [Trazodone ] Other (See Comments)    Paranoia    Peanut (Diagnostic) Hives and Other (See Comments)    Raw peanuts only  OK to eat peanut butter.   Apresoline  [Hydralazine ] Other (See Comments)    Muscle spasms   Sulfa Antibiotics Itching   Tape Rash   Topamax  [Topiramate ] Other (See Comments)    Paranoia    Please draw monthly labs on arrival. Then will need weekly potassium level.   Maisie Ronnald Acosta PA-C

## 2023-10-19 NOTE — Progress Notes (Signed)
 Late note entry- October 19, 2023  Clinicals faxed to [facility] this morning in order for pt to resume care on Tuesday. Clinic also requesting new HD orders. Contacted renal PA with clinic's request. Pt made aware of HD appt days/time yesterday and info sent to pt via text. Info was also added to AVS as well.   Randine Mungo Dialysis Navigator [phone]

## 2023-11-02 ENCOUNTER — Ambulatory Visit (HOSPITAL_COMMUNITY)
Admission: EM | Admit: 2023-11-02 | Discharge: 2023-11-02 | Disposition: A | Discharge status: Home / Self Care | Attending: Family Medicine | Admitting: Family Medicine

## 2023-11-02 ENCOUNTER — Encounter (HOSPITAL_COMMUNITY): Payer: Self-pay

## 2023-11-02 DIAGNOSIS — F419 Anxiety disorder, unspecified: Secondary | ICD-10-CM | POA: Insufficient documentation

## 2023-11-02 DIAGNOSIS — H269 Unspecified cataract: Secondary | ICD-10-CM | POA: Diagnosis not present

## 2023-11-02 DIAGNOSIS — Z638 Other specified problems related to primary support group: Secondary | ICD-10-CM | POA: Insufficient documentation

## 2023-11-02 DIAGNOSIS — F429 Obsessive-compulsive disorder, unspecified: Secondary | ICD-10-CM | POA: Insufficient documentation

## 2023-11-02 DIAGNOSIS — G4730 Sleep apnea, unspecified: Secondary | ICD-10-CM | POA: Insufficient documentation

## 2023-11-02 DIAGNOSIS — Z79899 Other long term (current) drug therapy: Secondary | ICD-10-CM | POA: Insufficient documentation

## 2023-11-02 DIAGNOSIS — Z9151 Personal history of suicidal behavior: Secondary | ICD-10-CM | POA: Insufficient documentation

## 2023-11-02 DIAGNOSIS — F259 Schizoaffective disorder, unspecified: Secondary | ICD-10-CM | POA: Insufficient documentation

## 2023-11-02 NOTE — Discharge Instructions (Addendum)
 Discharge Recommendations:   Medications: Patient is to take medications as prescribed. The patient or patient's guardian is to contact a medical professional and/or outpatient provider to address any new side effects that develop. The patient or the patient's guardian should update outpatient providers of any new medications and/or medication changes.   Outpatient Follow up: Please review list of outpatient resources for psychiatry and counseling. Please follow up with your primary care provider for all medical related needs.    Therapy: We recommend that patient participate in individual therapy to address mental health concerns.   Atypical antipsychotics: If you are prescribed an atypical antipsychotic, it is recommended that your height, weight, BMI, blood pressure, fasting lipid panel, and fasting blood sugar be monitored by your outpatient providers.  Safety:   The following safety precautions should be taken:   No sharp objects. This includes scissors, razors, scrapers, and putty knives.   Chemicals should be removed and locked up.   Medications should be removed and locked up.   Weapons should be removed and locked up. This includes firearms, knives and instruments that can be used to cause injury.   The patient should abstain from use of illicit substances/drugs and abuse of any medications.  If symptoms worsen or do not continue to improve or if the patient becomes actively suicidal or homicidal then it is recommended that the patient return to the closest hospital emergency department, the [facility], or call 911 for further evaluation and treatment.  National Suicide Prevention Lifeline 1-800-SUICIDE or (979)723-2858.  About 988 988 offers 24/7 access to trained crisis counselors who can help people experiencing mental health-related distress. People can call or text 988 or chat 988lifeline.org for themselves or if they are worried about a  loved one who may need crisis support.

## 2023-11-02 NOTE — Discharge Summary (Signed)
 Kate HERO Huyser to be discharged Home per NP order. An After Visit Summary was printed and given to the patient by provider. Patient escorted out and discharged home via private auto.  Dorla Jung  11/02/2023 4:31 PM
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
## 2023-11-07 ENCOUNTER — Encounter (HOSPITAL_COMMUNITY): Payer: Self-pay

## 2023-11-26 ENCOUNTER — Other Ambulatory Visit: Payer: Self-pay

## 2023-11-26 ENCOUNTER — Other Ambulatory Visit (HOSPITAL_COMMUNITY): Payer: Self-pay

## 2023-11-26 MED ORDER — DICYCLOMINE HCL 10 MG PO CAPS
10.0000 mg | ORAL_CAPSULE | Freq: Three times a day (TID) | ORAL | 1 refills | Status: AC
  Filled 2023-11-26: qty 270, 90d supply, fill #0

## 2023-12-09 IMAGING — DX DG CHEST 2V
2 series · 2 of 2 positions shown · non-contrast
Comparison: August 08, 2020.

CLINICAL DATA: Shortness of breath.

EXAM:
CHEST - 2 VIEW

[chest lat]
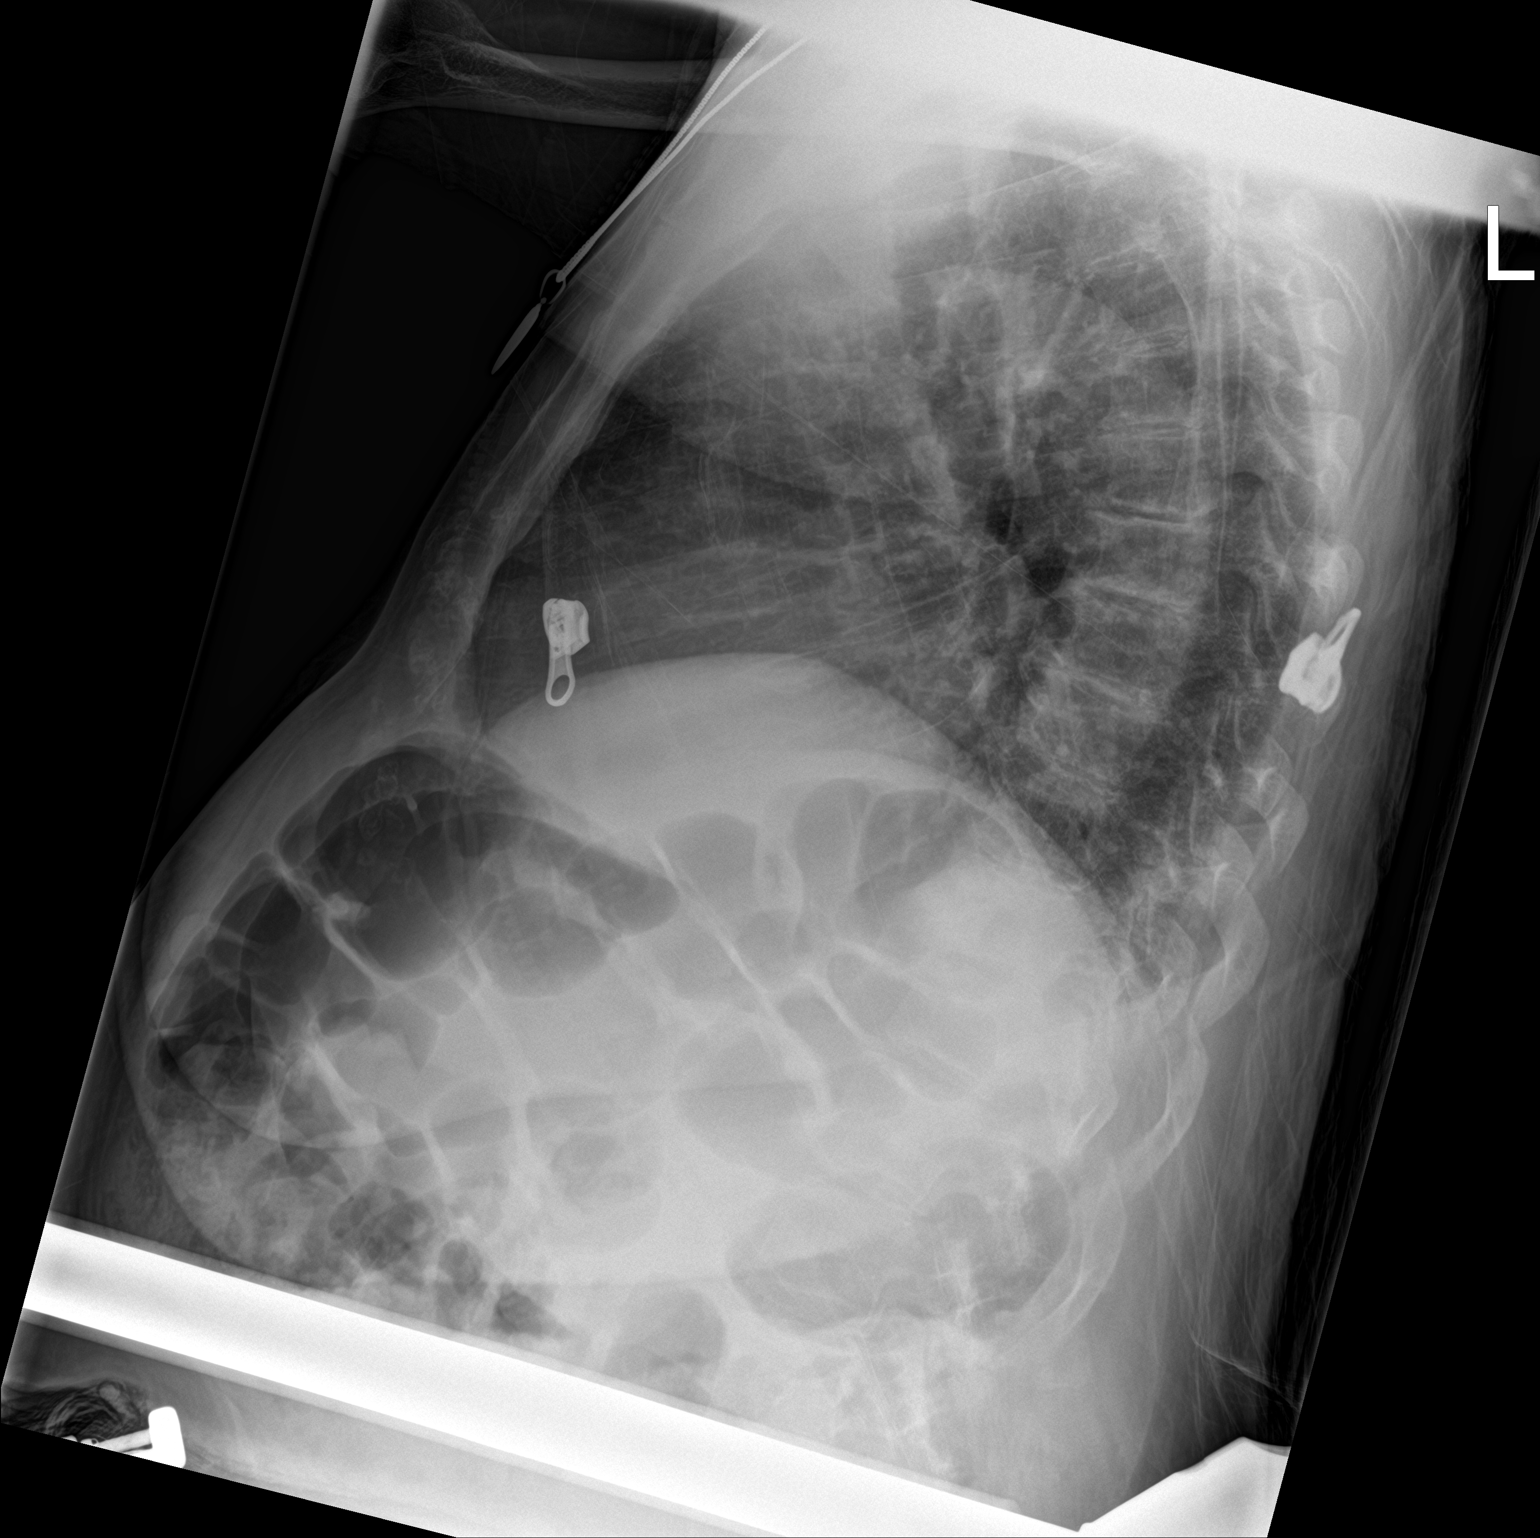

[chest ap]
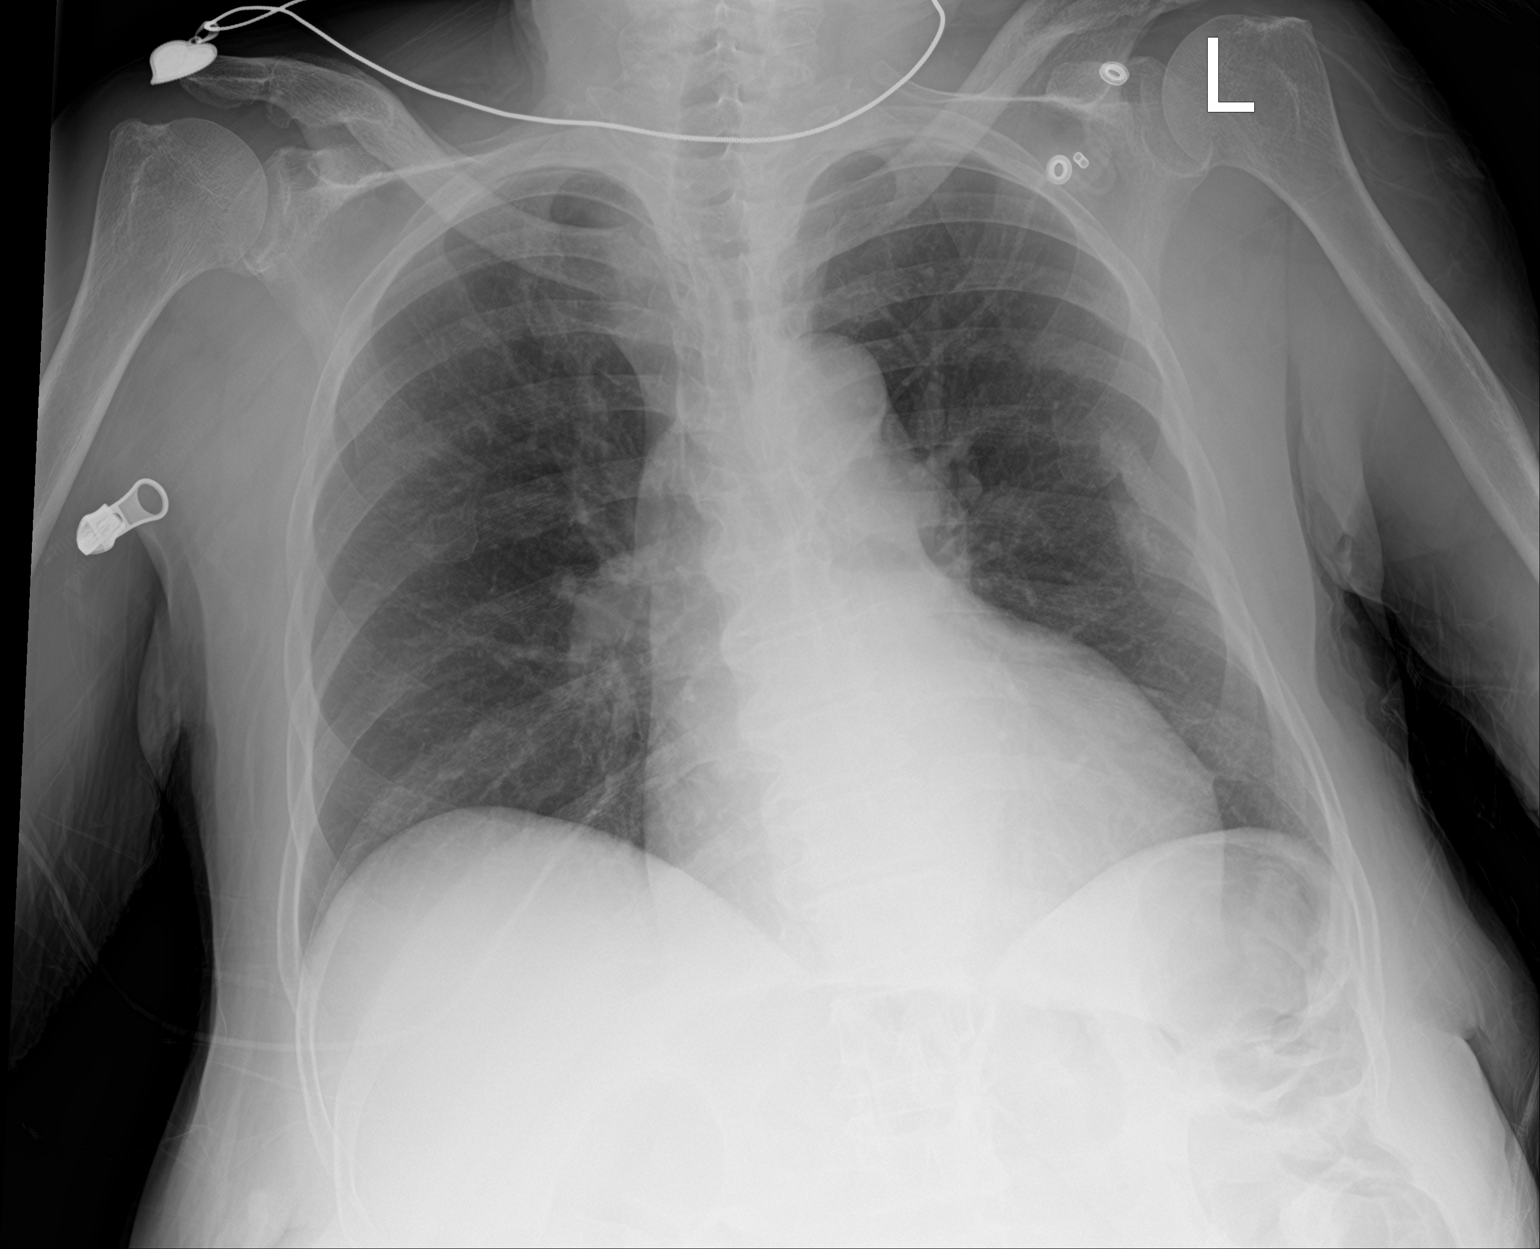

[2 of 2 positions shown; findings below may reference images not displayed]

FINDINGS: Stable cardiomediastinal silhouette. Both lungs are clear. The
visualized skeletal structures are unremarkable.
IMPRESSION: No active cardiopulmonary disease.

## 2023-12-22 IMAGING — CR DG CHEST 2V
2 series · 2 of 2 positions shown · non-contrast
Comparison: 12/26/2020

CLINICAL DATA: Chest pain and shortness of breath

EXAM:
CHEST - 2 VIEW

[chest lat]
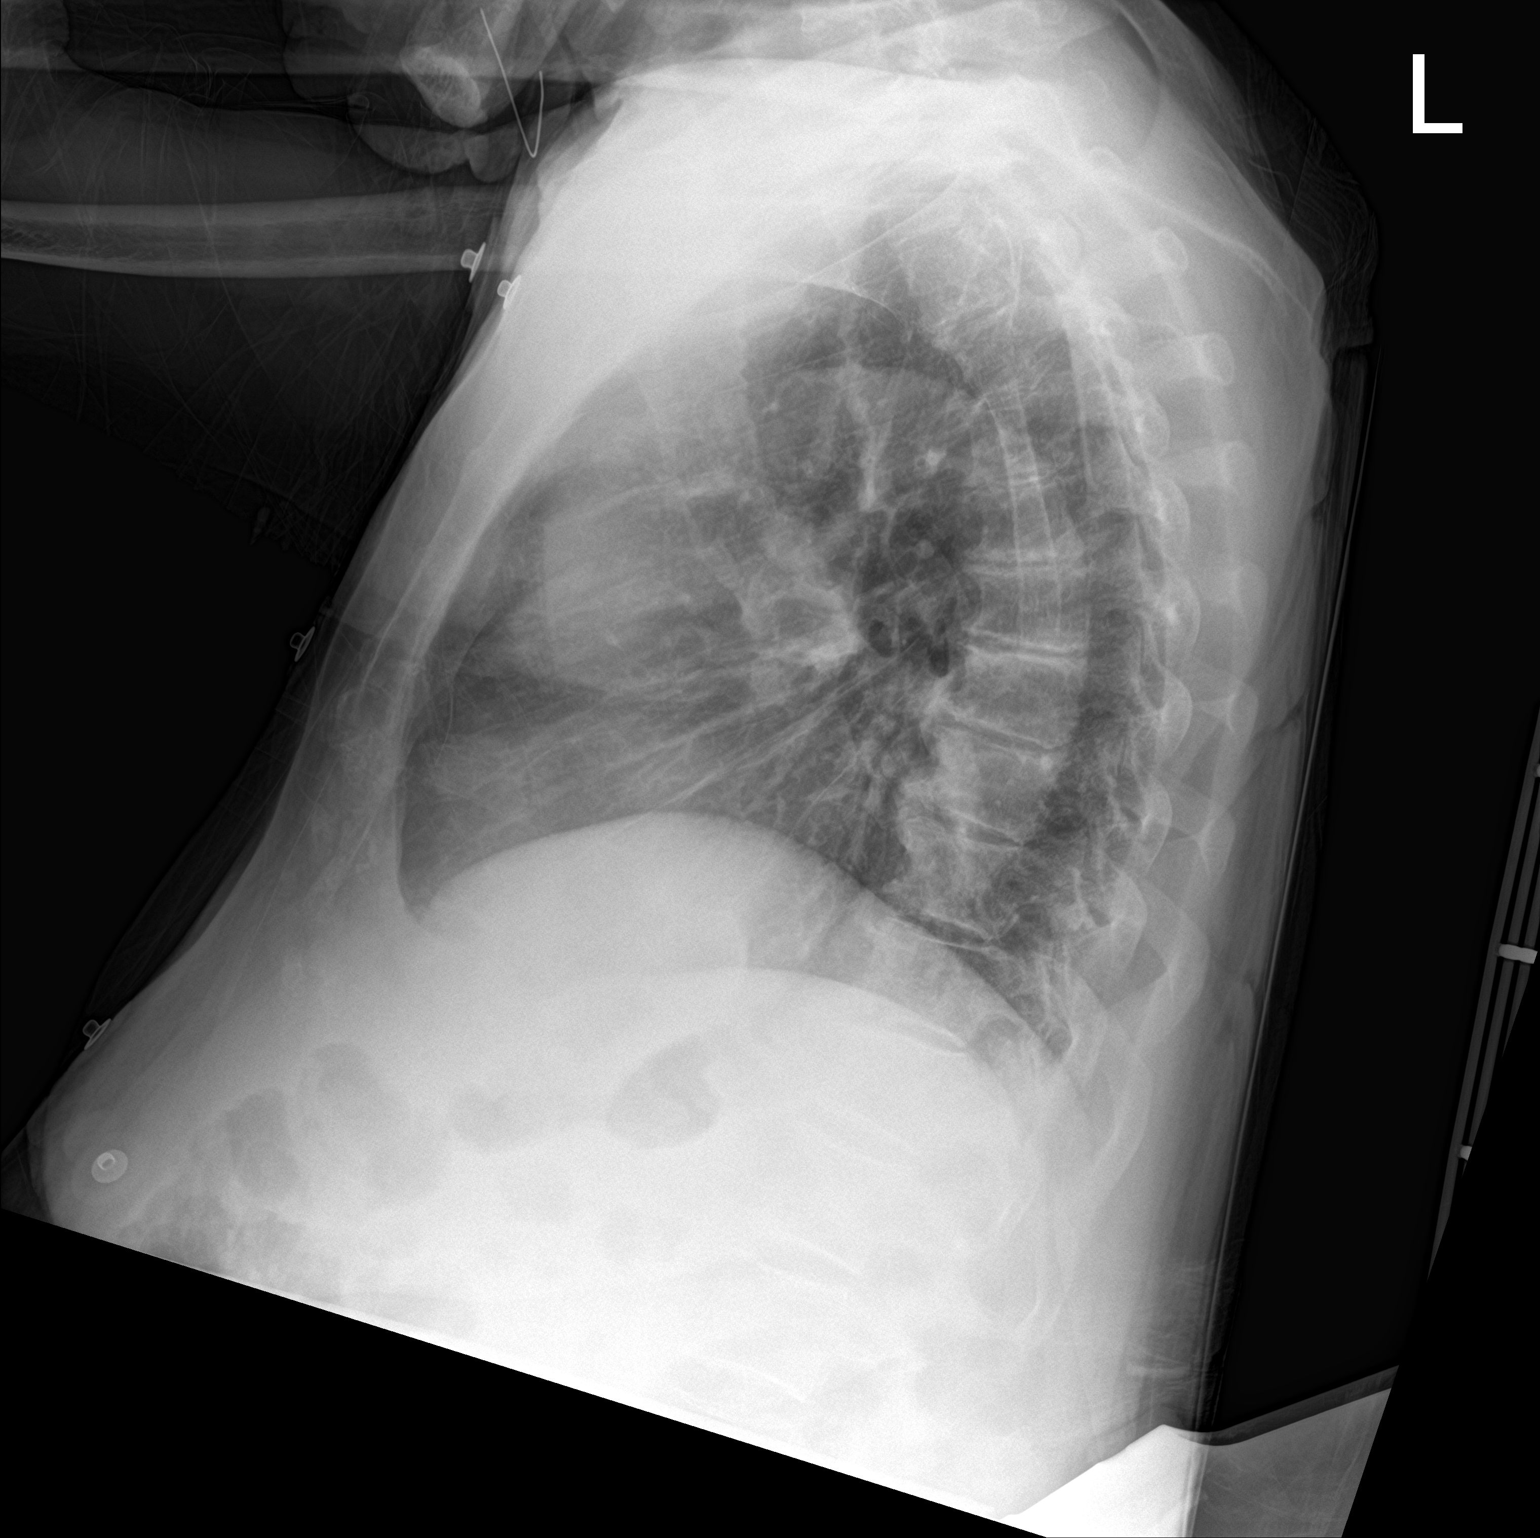

[chest ap]
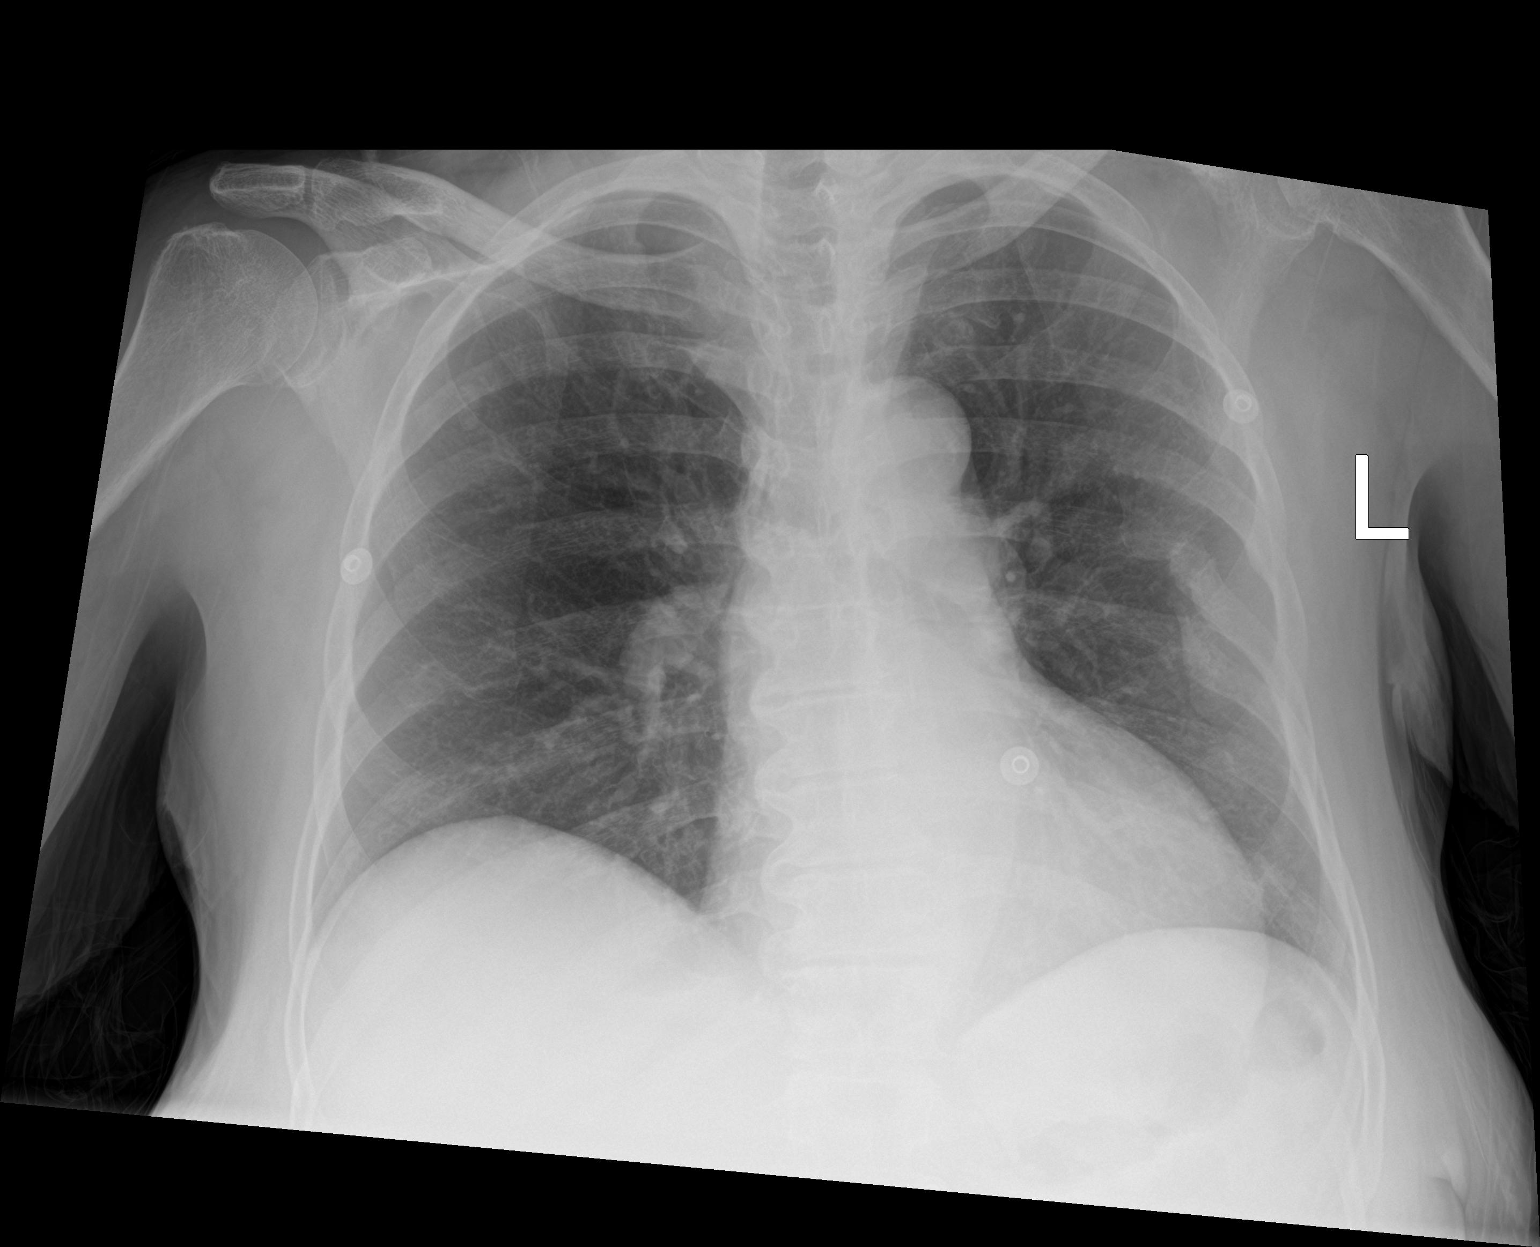

[2 of 2 positions shown; findings below may reference images not displayed]

FINDINGS: Stable heart size and vascularity. Clear lungs. Negative for
pneumonia, edema, effusion or pneumothorax. Left rib fractures noted
with deformity. Artifact overlies the right upper lobe. Degenerative
changes of the spine. Trachea midline.
IMPRESSION: Stable exam. No active chest disease or interval change by plain
radiography.

## 2023-12-31 ENCOUNTER — Ambulatory Visit: Payer: Self-pay

## 2023-12-31 ENCOUNTER — Telehealth: Payer: Self-pay

## 2023-12-31 NOTE — Telephone Encounter (Signed)
 FYI Only or Action Required?: FYI only for provider: [facility].  Patient was last seen in primary care on 06/09/2022 by Hardy Pears, DO.  Called Nurse Triage reporting Cough.  Symptoms began several weeks ago.  Interventions attempted: Rest, hydration, or home remedies.  Symptoms are: gradually worsening.  Triage Disposition: Information or Advice Only Call  Patient/caregiver understands and will follow disposition?:   Copied from CRM #[id number]. Topic: Clinical - Red Word Triage >> Dec 31, 2023 11:25 AM Wess RAMAN wrote: Red Word that prompted transfer to Nurse Triage: Cough and when she goes to sleep, she stops breathing. She needs a new CPAP and she took herself off. Lots of mucus  Needs new patient appt with MD only. Does not have preferred location, just one close to home address. >> Dec 31, 2023 11:52 AM Donna BRAVO wrote: call was dropped, pt calling back refusing to wait for NT patient wanting to establish care with an MD    Reason for Disposition  Health information question, no triage required and triager able to answer question  Answer Assessment - Initial Assessment Questions 1. REASON FOR CALL: What is the main reason for your call? or How can I best help you?     Patient looking to re-establish with [facility]- number provided to their offices as she was seen last year and they would still have her established. NOV for new patient further out and she wants to stay w [facility].  Stopped using CPAP and having issues again. Advised any CP, SOB, Dizziness, Fever to call office or be seen in ED/UC in the interim  Protocols used: Information Only Call - No Triage-A-AH

## 2023-12-31 NOTE — Telephone Encounter (Signed)
 Copied from CRM [id number]. Topic: Clinical - Refused Triage >> Dec 31, 2023 11:53 AM Donna BRAVO wrote: Patient/caller voiced complaints of .   call was dropped, pt calling back refusing to wait for NT patient wanting to establish care with an MD Patient has a lot of health problems. Breathing problems at night, wakes up choking due to stop breathing.   Declined transfer to triage.  Patient stated she had appts a [facility] Patient insisted on scheduling at this practice  Provided [facility] phone  [phone] Patient stated she will call them after lunch

## 2024-01-04 ENCOUNTER — Ambulatory Visit (INDEPENDENT_AMBULATORY_CARE_PROVIDER_SITE_OTHER)

## 2024-01-04 VITALS — BP 125/89 | HR 88

## 2024-01-04 DIAGNOSIS — I10 Essential (primary) hypertension: Secondary | ICD-10-CM | POA: Diagnosis not present

## 2024-01-04 DIAGNOSIS — R5381 Other malaise: Secondary | ICD-10-CM | POA: Diagnosis not present

## 2024-01-04 DIAGNOSIS — G629 Polyneuropathy, unspecified: Secondary | ICD-10-CM

## 2024-01-04 DIAGNOSIS — I5032 Chronic diastolic (congestive) heart failure: Secondary | ICD-10-CM | POA: Diagnosis not present

## 2024-01-04 DIAGNOSIS — G4733 Obstructive sleep apnea (adult) (pediatric): Secondary | ICD-10-CM | POA: Diagnosis not present

## 2024-01-04 DIAGNOSIS — F200 Paranoid schizophrenia: Secondary | ICD-10-CM | POA: Diagnosis not present

## 2024-01-04 DIAGNOSIS — F3131 Bipolar disorder, current episode depressed, mild: Secondary | ICD-10-CM | POA: Diagnosis not present

## 2024-01-04 DIAGNOSIS — F333 Major depressive disorder, recurrent, severe with psychotic symptoms: Secondary | ICD-10-CM

## 2024-01-04 MED ORDER — AMLODIPINE BESYLATE 5 MG PO TABS: 5.0000 mg | ORAL_TABLET | Freq: Two times a day (BID) | ORAL | 0 refills | Status: AC

## 2024-01-04 NOTE — Patient Instructions (Addendum)
 For information on therapists, please go to [email or web address]. You can also contact your insurance company to find an hospital doctor.   Today I sent in referrals for the heart doctor, the sleep doctor (they do the CPAP machine), and physical therapy. They should call you in 1-2 weeks to make an appointment.   Please pick up your risperidone  from the pharmacy and take it as prescribed.   Please come back and see us  in 2 months.

## 2024-01-04 NOTE — Progress Notes (Addendum)
" ° ° °  SUBJECTIVE:   CHIEF COMPLAINT / HPI: Establish care  Patient was previously seen by our clinic.  However in April 2024 she began seeing Arthur Reese, MD for her medical care.  She states that she came back to our clinic due to a change in her insurance.   She states she is not doing well today.  She feels paranoid, has trust issues, and is afraid that people are out to get her.  She has a history of paranoid schizophrenia, schizoaffective disorder and major depressive disorder with psychosis.  She currently takes duloxetine  30 mg, escitalopram  10 mg, and lamotrigine  100 mg.  She shows me on her phone that risperidone  has been sent to her pharmacy, but she states she has not picked it up yet.  Patient is on dialysis Tuesday, Thursday, and Saturday schedule.  She has a history of OSA and was previously on CPAP.  She states she stopped taking it because she did not like to wear it.    Patient states she used to have a heart doctor and would like to get back in touch with one.  Patient states she used to do therapy and would like to get back in touch with a talk therapist.  Patient is in an electric wheelchair today.  However, she states she would like to try and keep her ability to walk.  She was previously working with physical therapy and would like to go back to them.  She states that she does like exercises.  PERTINENT  PMH / PSH: ESRD on dialysis, OSA and COPD overlap syndrome, CAD, history of anoxic brain injury, morbid obesity, schizoaffective disorder (bipolar type), schizophrenia paranoid, severe spinal stenosis, lumbar  OBJECTIVE:   BP 125/89   Pulse 88   SpO2 96%   General: 63 y.o. female presents in an electric wheelchair, NAD Cardiovascular: RRR, no M/R/G Respiratory: CTAB, normal work of breathing on room air Abdomen: soft, non-distended, non-tender to palpation Psych: patient expresses paranoid thought, mood and affect appropriate   ASSESSMENT/PLAN:   Assessment &  Plan Hypertension, unspecified type BP at goal today.  - Refilled amlodipine   Chronic diastolic heart failure (HCC) Patient with history of chronic diastolic heart failure. States she has not seen a cardiologist in a while.  - Referral to cardiology  OSA (obstructive sleep apnea) Patient with history of OSA, previously on CPAP. Stopped using and does not currently have a CPAP machine.  - Counseled patient on importance of CPAP - Referral to sleep studies  Physical deconditioning Patient presenting today in an electric wheelchair. History of diabetic neuropathy and multiple falls. States she previously saw physical therapy and found it helpful.  - referral to physical therapy  Schizophrenia, paranoid [facility]) Patient shows me on her phone that Risperidone  was sent in to her pharmacy, but she has not picked it up yet.  - Educated patient on purpose of medication and told her to pick it up.  - Denies thoughts of SI or HI MDD (major depressive disorder), recurrent, severe, with psychosis (HCC) Bipolar affective disorder, currently depressed, mild (HCC) PHQ-9 elevated at 17 today. Negative question 9. Complex psychiatric history.Patient currently on escitalopram  10 mg, duloxetine  30 mg, and lamotrigine  100 mg.  - Educated patient that she will need to continue following with psychiatry for her psychiatric medications due to her complex history and needs      Raguel KANDICE Lee, DO [facility] "

## 2024-01-07 NOTE — Assessment & Plan Note (Signed)
 PHQ-9 elevated at 17 today. Negative question 9. Complex psychiatric history.Patient currently on escitalopram  10 mg, duloxetine  30 mg, and lamotrigine  100 mg.  - Educated patient that she will need to continue following with psychiatry for her psychiatric medications due to her complex history and needs

## 2024-01-07 NOTE — Assessment & Plan Note (Signed)
 Patient presenting today in an electric wheelchair. History of diabetic neuropathy and multiple falls. States she previously saw physical therapy and found it helpful.  - referral to physical therapy

## 2024-01-07 NOTE — Assessment & Plan Note (Signed)
 Patient shows me on her phone that Risperidone  was sent in to her pharmacy, but she has not picked it up yet.  - Educated patient on purpose of medication and told her to pick it up.  - Denies thoughts of SI or HI

## 2024-01-08 ENCOUNTER — Emergency Department (HOSPITAL_COMMUNITY)

## 2024-01-08 ENCOUNTER — Inpatient Hospital Stay (HOSPITAL_COMMUNITY)
Admission: EM | Admit: 2024-01-08 | Discharge: 2024-01-14 | DRG: 640 | Disposition: A | Discharge status: Home Health | Attending: Family Medicine | Admitting: Family Medicine

## 2024-01-08 ENCOUNTER — Other Ambulatory Visit: Payer: Self-pay

## 2024-01-08 ENCOUNTER — Encounter (HOSPITAL_COMMUNITY): Payer: Self-pay | Admitting: Emergency Medicine

## 2024-01-08 DIAGNOSIS — I2489 Other forms of acute ischemic heart disease: Secondary | ICD-10-CM | POA: Diagnosis present

## 2024-01-08 DIAGNOSIS — I5032 Chronic diastolic (congestive) heart failure: Secondary | ICD-10-CM | POA: Diagnosis present

## 2024-01-08 DIAGNOSIS — Z8674 Personal history of sudden cardiac arrest: Secondary | ICD-10-CM

## 2024-01-08 DIAGNOSIS — R079 Chest pain, unspecified: Principal | ICD-10-CM | POA: Diagnosis present

## 2024-01-08 DIAGNOSIS — E785 Hyperlipidemia, unspecified: Secondary | ICD-10-CM | POA: Diagnosis present

## 2024-01-08 DIAGNOSIS — J4489 Other specified chronic obstructive pulmonary disease: Secondary | ICD-10-CM | POA: Diagnosis present

## 2024-01-08 DIAGNOSIS — N2581 Secondary hyperparathyroidism of renal origin: Secondary | ICD-10-CM | POA: Diagnosis present

## 2024-01-08 DIAGNOSIS — Z5941 Food insecurity: Secondary | ICD-10-CM

## 2024-01-08 DIAGNOSIS — Z8249 Family history of ischemic heart disease and other diseases of the circulatory system: Secondary | ICD-10-CM

## 2024-01-08 DIAGNOSIS — Z818 Family history of other mental and behavioral disorders: Secondary | ICD-10-CM

## 2024-01-08 DIAGNOSIS — R519 Headache, unspecified: Secondary | ICD-10-CM | POA: Insufficient documentation

## 2024-01-08 DIAGNOSIS — F4312 Post-traumatic stress disorder, chronic: Secondary | ICD-10-CM | POA: Diagnosis present

## 2024-01-08 DIAGNOSIS — Z79899 Other long term (current) drug therapy: Secondary | ICD-10-CM

## 2024-01-08 DIAGNOSIS — Z5948 Other specified lack of adequate food: Secondary | ICD-10-CM

## 2024-01-08 DIAGNOSIS — I252 Old myocardial infarction: Secondary | ICD-10-CM

## 2024-01-08 DIAGNOSIS — Z8673 Personal history of transient ischemic attack (TIA), and cerebral infarction without residual deficits: Secondary | ICD-10-CM

## 2024-01-08 DIAGNOSIS — E1122 Type 2 diabetes mellitus with diabetic chronic kidney disease: Secondary | ICD-10-CM | POA: Diagnosis present

## 2024-01-08 DIAGNOSIS — N186 End stage renal disease: Secondary | ICD-10-CM | POA: Diagnosis present

## 2024-01-08 DIAGNOSIS — F429 Obsessive-compulsive disorder, unspecified: Secondary | ICD-10-CM | POA: Diagnosis present

## 2024-01-08 DIAGNOSIS — I358 Other nonrheumatic aortic valve disorders: Secondary | ICD-10-CM | POA: Diagnosis present

## 2024-01-08 DIAGNOSIS — J439 Emphysema, unspecified: Secondary | ICD-10-CM | POA: Diagnosis present

## 2024-01-08 DIAGNOSIS — F1911 Other psychoactive substance abuse, in remission: Secondary | ICD-10-CM | POA: Diagnosis present

## 2024-01-08 DIAGNOSIS — J811 Chronic pulmonary edema: Secondary | ICD-10-CM | POA: Diagnosis present

## 2024-01-08 DIAGNOSIS — F250 Schizoaffective disorder, bipolar type: Secondary | ICD-10-CM | POA: Diagnosis present

## 2024-01-08 DIAGNOSIS — Z91199 Patient's noncompliance with other medical treatment and regimen due to unspecified reason: Secondary | ICD-10-CM

## 2024-01-08 DIAGNOSIS — G894 Chronic pain syndrome: Secondary | ICD-10-CM | POA: Diagnosis present

## 2024-01-08 DIAGNOSIS — Z91410 Personal history of adult physical and sexual abuse: Secondary | ICD-10-CM

## 2024-01-08 DIAGNOSIS — R0789 Other chest pain: Principal | ICD-10-CM | POA: Diagnosis present

## 2024-01-08 DIAGNOSIS — I132 Hypertensive heart and chronic kidney disease with heart failure and with stage 5 chronic kidney disease, or end stage renal disease: Secondary | ICD-10-CM | POA: Diagnosis present

## 2024-01-08 DIAGNOSIS — G4733 Obstructive sleep apnea (adult) (pediatric): Secondary | ICD-10-CM | POA: Diagnosis present

## 2024-01-08 DIAGNOSIS — Z888 Allergy status to other drugs, medicaments and biological substances status: Secondary | ICD-10-CM

## 2024-01-08 DIAGNOSIS — E669 Obesity, unspecified: Secondary | ICD-10-CM | POA: Diagnosis present

## 2024-01-08 DIAGNOSIS — Z91158 Patient's noncompliance with renal dialysis for other reason: Secondary | ICD-10-CM

## 2024-01-08 DIAGNOSIS — E871 Hypo-osmolality and hyponatremia: Secondary | ICD-10-CM | POA: Diagnosis present

## 2024-01-08 DIAGNOSIS — E8779 Other fluid overload: Principal | ICD-10-CM | POA: Diagnosis present

## 2024-01-08 DIAGNOSIS — Z90710 Acquired absence of both cervix and uterus: Secondary | ICD-10-CM

## 2024-01-08 DIAGNOSIS — Z6835 Body mass index (BMI) 35.0-35.9, adult: Secondary | ICD-10-CM

## 2024-01-08 DIAGNOSIS — Z882 Allergy status to sulfonamides status: Secondary | ICD-10-CM

## 2024-01-08 DIAGNOSIS — E1140 Type 2 diabetes mellitus with diabetic neuropathy, unspecified: Secondary | ICD-10-CM | POA: Diagnosis present

## 2024-01-08 DIAGNOSIS — K219 Gastro-esophageal reflux disease without esophagitis: Secondary | ICD-10-CM | POA: Diagnosis present

## 2024-01-08 DIAGNOSIS — I214 Non-ST elevation (NSTEMI) myocardial infarction: Secondary | ICD-10-CM

## 2024-01-08 DIAGNOSIS — F3131 Bipolar disorder, current episode depressed, mild: Secondary | ICD-10-CM | POA: Diagnosis present

## 2024-01-08 DIAGNOSIS — I2510 Atherosclerotic heart disease of native coronary artery without angina pectoris: Secondary | ICD-10-CM | POA: Diagnosis present

## 2024-01-08 DIAGNOSIS — G43909 Migraine, unspecified, not intractable, without status migrainosus: Secondary | ICD-10-CM | POA: Diagnosis present

## 2024-01-08 DIAGNOSIS — Z886 Allergy status to analgesic agent status: Secondary | ICD-10-CM

## 2024-01-08 DIAGNOSIS — Z789 Other specified health status: Secondary | ICD-10-CM

## 2024-01-08 DIAGNOSIS — F1421 Cocaine dependence, in remission: Secondary | ICD-10-CM | POA: Diagnosis present

## 2024-01-08 DIAGNOSIS — Z604 Social exclusion and rejection: Secondary | ICD-10-CM | POA: Diagnosis present

## 2024-01-08 DIAGNOSIS — Z992 Dependence on renal dialysis: Secondary | ICD-10-CM

## 2024-01-08 DIAGNOSIS — D631 Anemia in chronic kidney disease: Secondary | ICD-10-CM | POA: Diagnosis present

## 2024-01-08 DIAGNOSIS — E8889 Other specified metabolic disorders: Secondary | ICD-10-CM | POA: Diagnosis present

## 2024-01-08 DIAGNOSIS — Z87891 Personal history of nicotine dependence: Secondary | ICD-10-CM

## 2024-01-08 DIAGNOSIS — I10 Essential (primary) hypertension: Secondary | ICD-10-CM | POA: Diagnosis present

## 2024-01-08 DIAGNOSIS — Z91048 Other nonmedicinal substance allergy status: Secondary | ICD-10-CM

## 2024-01-08 DIAGNOSIS — Z885 Allergy status to narcotic agent status: Secondary | ICD-10-CM

## 2024-01-08 DIAGNOSIS — Z91010 Allergy to peanuts: Secondary | ICD-10-CM

## 2024-01-08 DIAGNOSIS — F419 Anxiety disorder, unspecified: Secondary | ICD-10-CM | POA: Diagnosis present

## 2024-01-08 DIAGNOSIS — Z1152 Encounter for screening for COVID-19: Secondary | ICD-10-CM

## 2024-01-08 DIAGNOSIS — Z8659 Personal history of other mental and behavioral disorders: Secondary | ICD-10-CM

## 2024-01-08 LAB — CBC
HCT: 36.1 % (ref 36.0–46.0)
Hemoglobin: 11.2 g/dL — ABNORMAL LOW (ref 12.0–15.0)
MCH: 28.2 pg (ref 26.0–34.0)
MCHC: 31 g/dL (ref 30.0–36.0)
MCV: 90.9 fL (ref 80.0–100.0)
Platelets: 283 K/uL (ref 150–400)
RBC: 3.97 MIL/uL (ref 3.87–5.11)
RDW: 16.6 % — ABNORMAL HIGH (ref 11.5–15.5)
WBC: 8.5 K/uL (ref 4.0–10.5)
nRBC: 0 % (ref 0.0–0.2)

## 2024-01-08 LAB — BASIC METABOLIC PANEL WITH GFR
Anion gap: 17 — ABNORMAL HIGH (ref 5–15)
BUN: 77 mg/dL — ABNORMAL HIGH (ref 8–23)
CO2: 23 mmol/L (ref 22–32)
Calcium: 10.1 mg/dL (ref 8.9–10.3)
Chloride: 95 mmol/L — ABNORMAL LOW (ref 98–111)
Creatinine, Ser: 8.55 mg/dL — ABNORMAL HIGH (ref 0.44–1.00)
GFR, Estimated: 5 mL/min — ABNORMAL LOW
Glucose, Bld: 81 mg/dL (ref 70–99)
Potassium: 3.9 mmol/L (ref 3.5–5.1)
Sodium: 135 mmol/L (ref 135–145)

## 2024-01-08 LAB — RESP PANEL BY RT-PCR (RSV, FLU A&B, COVID)  RVPGX2
Influenza A by PCR: NEGATIVE
Influenza B by PCR: NEGATIVE
Resp Syncytial Virus by PCR: NEGATIVE
SARS Coronavirus 2 by RT PCR: NEGATIVE

## 2024-01-08 LAB — TROPONIN T, HIGH SENSITIVITY: Troponin T High Sensitivity: 190 ng/L (ref 0–19)

## 2024-01-08 NOTE — ED Triage Notes (Signed)
 Quick triage note: pt reports intermittent CP x 1 day. Denies N/V/D. Also reports increase in depression this week, Denies SI/HI.

## 2024-01-08 NOTE — ED Provider Notes (Signed)
 " [place] [facility] AT [facility] Provider Note   CSN: [id number] Arrival date & time: 01/08/24  2126     Patient presents with: Chest Pain and Depression   Tiffany [name] is a 63 y.o. female.   63 year old female with history of ESRD (Tues/Thurs/Sat with right chest catheter, last full dialysis was on Thursday 01/03/24). Presents with left side chest tightness onset early this morning, constant throughout the day, does not radiate, nothing makes pain better or worse. Associated with SHOB, cough (x 1 week, productive). Lower extremity edema reported to be baseline.  History of OSA (states not compliant with CPAP), depression (states not suicidal but struggling with desire to go to dialysis), DM, HTN, COPD, CVA, cardiac arrest, CHF, reports multiple Mis (states no stents- they just opened my arteries).        Prior to Admission medications  Medication Sig Start Date End Date Taking? Authorizing Provider  amLODipine  (NORVASC ) 5 MG tablet Take 1 tablet (5 mg total) by mouth 2 (two) times daily. 01/04/24   Baker, Raguel MATSU, DO  atorvastatin  (LIPITOR ) 20 MG tablet Take 1 tablet (20 mg total) by mouth daily. 07/07/23 01/04/24  [place], Eric J, DO  calcitRIOL  (ROCALTROL ) 0.5 MCG capsule Take 1 capsule (0.5 mcg total) by mouth daily. 07/07/23   [place], Camellia PARAS, DO  carvedilol  (COREG ) 12.5 MG tablet Take 1 tablet (12.5 mg total) by mouth 2 (two) times daily. 07/07/23 01/04/24  [place], Camellia PARAS, DO  dicyclomine  (BENTYL ) 10 MG capsule Take 1 capsule (10 mg total) by mouth 3 (three) times daily. 11/26/23     docusate sodium  (COLACE) 100 MG capsule Take 1 capsule (100 mg total) by mouth daily. 09/07/23   Jadapalle, Sree, MD  DULoxetine  (CYMBALTA ) 30 MG capsule Take 1 capsule (30 mg total) by mouth daily. 09/06/23   Donnelly Mellow, MD  epoetin  alfa (EPOGEN ) 10000 UNIT/ML injection Inject 1 mL (10,000 Units total) into the vein every Monday, Wednesday, and Friday with  hemodialysis. 11/22/22   Cam Charlie Loving, DO  escitalopram  (LEXAPRO ) 10 MG tablet Take 1 tablet (10 mg total) by mouth daily. 09/06/23 01/04/24  Jadapalle, Sree, MD  ezetimibe  (ZETIA ) 10 MG tablet Take 1 tablet (10 mg total) by mouth daily. 09/07/23   Donnelly Mellow, MD  ferrous sulfate  325 (65 FE) MG tablet Take 1 tablet (325 mg total) by mouth daily. 07/07/23 07/01/24  [place], Eric J, DO  Fluticasone -Umeclidin-Vilant (TRELEGY ELLIPTA ) 100-62.5-25 MCG/ACT AEPB Inhale 1 puff into the lungs daily. 10/02/23   Doretha Folks, MD  folic acid  (FOLVITE ) 1 MG tablet Take 1 tablet (1 mg total) by mouth daily. 07/07/23   [place], Camellia PARAS, DO  lamoTRIgine  (LAMICTAL ) 100 MG tablet Take 100 mg by mouth daily.    [provider]  Melatonin 10 MG TBDP Take 1 tablet (10 mg) by mouth at bedtime. 09/06/23   Donnelly Mellow, MD  metolazone  (ZAROXOLYN ) 2.5 MG tablet Take 1 tablet (2.5 mg total) by mouth every Monday, Wednesday, and Friday. Patient not taking: Reported on 01/04/2024 07/09/23 10/07/23  [place], Eric J, DO  omeprazole  (PRILOSEC) 40 MG capsule Take 1 capsule (40 mg total) by mouth every morning. 07/07/23 01/04/24  [place], Camellia PARAS, DO  potassium chloride  SA (KLOR-CON  M) 20 MEQ tablet Take 1 tablet (20 mEq total) by mouth 2 (two) times daily. 09/06/23   Donnelly Mellow, MD  prazosin  (MINIPRESS ) 2 MG capsule Take 1 capsule (2 mg total) by mouth at bedtime. 02/26/23  torsemide  40 MG TABS Take 40 mg by mouth 2 (two) times daily. 08/04/23   Amponsah, Prosper M, MD    Allergies: Asa [aspirin ], Deltasone  [prednisone ], Latuda [lurasidone hcl], Lortab [hydrocodone -acetaminophen ], Magnesium -containing compounds, Ultram  [tramadol ], Codeine, Desyrel  [trazodone ], Peanut (diagnostic), Apresoline  [hydralazine ], Sulfa antibiotics, Tape, and Topamax  [topiramate ]    Review of Systems Negative except as per HPI Updated Vital Signs BP (!) 164/93   Pulse 85   Temp 98.7 F (37.1 C) (Oral)   Resp (!) 28   Ht  5' 3 (1.6 m)   Wt 90 kg   SpO2 99%   BMI 35.15 kg/m   Physical Exam Vitals and nursing note reviewed.  Constitutional:      General: [name] not in acute distress.    Appearance: [name] well-developed. [name] obese. [name] not diaphoretic.  HENT:     Head: Normocephalic and atraumatic.  Cardiovascular:     Rate and Rhythm: Normal rate and regular rhythm.     Heart sounds: Normal heart sounds.  Pulmonary:     Effort: Pulmonary effort is normal.     Breath sounds: Examination of the right-lower field reveals decreased breath sounds. Examination of the left-lower field reveals decreased breath sounds. Decreased breath sounds present.  Chest:     Chest wall: No tenderness.     Comments: Right chest catheter  Abdominal:     Palpations: Abdomen is soft.     Tenderness: There is no abdominal tenderness.  Musculoskeletal:     Right lower leg: Edema present.     Left lower leg: Edema present.  Skin:    General: Skin is warm and dry.     Findings: No erythema or rash.  Neurological:     Mental Status: [name] alert and oriented to person, place, and time.  Psychiatric:        Behavior: Behavior normal.     (all labs ordered are listed, but only abnormal results are displayed) Labs Reviewed  BASIC METABOLIC PANEL WITH GFR - Abnormal; Notable for the following components:      Result Value   Chloride 95 (*)    BUN 77 (*)    Creatinine, Ser 8.55 (*)    GFR, Estimated 5 (*)    Anion gap 17 (*)    All other components within normal limits  CBC - Abnormal; Notable for the following components:   Hemoglobin 11.2 (*)    RDW 16.6 (*)    All other components within normal limits  TROPONIN T, HIGH SENSITIVITY - Abnormal; Notable for the following components:   Troponin T High Sensitivity 190 (*)    All other components within normal limits  TROPONIN T, HIGH SENSITIVITY - Abnormal; Notable for the following components:   Troponin T High Sensitivity 192 (*)    All other components  within normal limits  RESP PANEL BY RT-PCR (RSV, FLU A&B, COVID)  [id number]  CBC  CREATININE, SERUM    EKG: None  Radiology: DG Chest 2 View Result Date: 01/08/2024 EXAM: 2 VIEW(S) XRAY OF THE CHEST 01/08/2024 10:54:00 PM COMPARISON: 10/18/2023 CLINICAL HISTORY: cp FINDINGS: LINES, TUBES AND DEVICES: Tunneled right IJ hemodialysis catheter in place to distal SVC, unchanged. LUNGS AND PLEURA: Stable elevated right hemidiaphragm. No focal pulmonary opacity. No pleural effusion. No pneumothorax. HEART AND MEDIASTINUM: No acute abnormality of the cardiac and mediastinal silhouettes. BONES AND SOFT TISSUES: Vertebral endplate spurring at multiple levels in mid and lower thoracic spine. No acute osseous abnormality. IMPRESSION: 1.  No acute cardiopulmonary abnormality. Electronically signed by: Norman Gatlin MD 01/08/2024 10:57 PM EST RP Workstation: [id number]     Procedures   Medications Ordered in the ED  atorvastatin  (LIPITOR ) tablet 20 mg (has no administration in time range)  amLODipine  (NORVASC ) tablet 5 mg (5 mg Oral Given 01/09/24 0317)  carvedilol  (COREG ) tablet 12.5 mg (has no administration in time range)  ezetimibe  (ZETIA ) tablet 10 mg (has no administration in time range)  DULoxetine  (CYMBALTA ) DR capsule 30 mg (has no administration in time range)  escitalopram  (LEXAPRO ) tablet 10 mg (has no administration in time range)  pantoprazole  (PROTONIX ) EC tablet 40 mg (has no administration in time range)  lamoTRIgine  (LAMICTAL ) tablet 100 mg (has no administration in time range)  budesonide -glycopyrrolate -formoterol  (BREZTRI ) 160-9-4.8 MCG/ACT inhaler 2 puff (has no administration in time range)  heparin  injection 5,000 Units (has no administration in time range)  torsemide  (DEMADEX ) tablet 40 mg (has no administration in time range)  nitroGLYCERIN  (NITROGLYN) 2 % ointment 1 inch (1 inch Topical Given 01/09/24 0140)  prochlorperazine  (COMPAZINE ) injection 10 mg (10 mg Intramuscular  Given 01/09/24 0318)  diphenhydrAMINE  (BENADRYL ) capsule 25 mg (25 mg Oral Given 01/09/24 0316)                                    Medical Decision Making Amount and/or Complexity of Data Reviewed Labs: ordered. Radiology: ordered.  Risk Prescription drug management. Decision regarding hospitalization.   This patient presents to the ED for concern of CP, this involves an extensive number of treatment options, and is a complaint that carries with it a high risk of complications and morbidity.  The differential diagnosis includes but not limited to metabolic/electrolyte abnormality, HTN emergency, ACS   Co morbidities / Chronic conditions that complicate the patient evaluation  As reviewed per HPI   Additional history obtained:  Additional history obtained from EMR External records from outside source obtained and reviewed including prior labs and imaging    Lab Tests:  I Ordered, and personally interpreted labs.  The pertinent results include: Initial troponin is elevated at 198, secondary troponin elevated at 192, not significantly changed.  Negative for COVID, flu, RSV.  CBC without simian findings.  BMP with no with elevated creatinine at 0.8 0.55, elevated compared to baseline prior.  Potassium normal.   Imaging Studies ordered:  I ordered imaging studies including CXR  I independently visualized and interpreted imaging which showed no acute process  I agree with the radiologist interpretation   Cardiac Monitoring: / EKG:  The patient was maintained on a cardiac monitor.  I personally viewed and interpreted the cardiac monitored which showed an underlying rhythm of: Normal sinus rhythm, rate 87   Problem List / ED Course / Critical interventions / Medication management  63 yo female presents with CP tightness and SHOB in the setting of missed dialysis x 2. Pain worse with nitroglycerin . EKG nonischemic, troponins elevated and unchanged at 190 and 192.  Family  medicine service to admit with consult with cardiology for further care. I ordered medication including nitroglycerine paste   Reevaluation of the patient after these medicines showed that the patient no improvement in chest tightness  I have reviewed the patients home medicines and have made adjustments as needed   Consultations Obtained:  I requested consultation with the ER attending, Dr. Haze,  and discussed lab and imaging findings as well as pertinent plan - they  recommend: agree with admit to medicine, consult cardiology regarding heparin , nitroglycerin  paste Case discussed with family medicine service who will consult for admission, requests cardiology to follow Case discussed with Dr. Marty with cardiology who will come and see the patient   Social Determinants of Health:  Has PCP   Test / Admission - Considered:  admit      Final diagnoses:  Chest pain, unspecified type  NSTEMI (non-ST elevated myocardial infarction) (HCC)  ESRD (end stage renal disease) on dialysis [facility])    ED Discharge Orders     None          Beverley Leita LABOR, PA-C 01/09/24 0434  "

## 2024-01-08 NOTE — ED Notes (Signed)
.  Date and time results received: 01/08/2024 2311 (use smartphrase .now to insert current time)  Test: trop  Critical Value: 190  Name of Provider Notified: Long

## 2024-01-09 ENCOUNTER — Inpatient Hospital Stay (HOSPITAL_COMMUNITY)

## 2024-01-09 ENCOUNTER — Encounter (HOSPITAL_COMMUNITY): Payer: Self-pay | Admitting: Student

## 2024-01-09 DIAGNOSIS — D631 Anemia in chronic kidney disease: Secondary | ICD-10-CM | POA: Diagnosis present

## 2024-01-09 DIAGNOSIS — J4489 Other specified chronic obstructive pulmonary disease: Secondary | ICD-10-CM | POA: Diagnosis present

## 2024-01-09 DIAGNOSIS — Z992 Dependence on renal dialysis: Secondary | ICD-10-CM | POA: Diagnosis not present

## 2024-01-09 DIAGNOSIS — I2510 Atherosclerotic heart disease of native coronary artery without angina pectoris: Secondary | ICD-10-CM | POA: Diagnosis present

## 2024-01-09 DIAGNOSIS — E1122 Type 2 diabetes mellitus with diabetic chronic kidney disease: Secondary | ICD-10-CM | POA: Diagnosis present

## 2024-01-09 DIAGNOSIS — R072 Precordial pain: Secondary | ICD-10-CM

## 2024-01-09 DIAGNOSIS — E871 Hypo-osmolality and hyponatremia: Secondary | ICD-10-CM | POA: Diagnosis present

## 2024-01-09 DIAGNOSIS — R079 Chest pain, unspecified: Secondary | ICD-10-CM

## 2024-01-09 DIAGNOSIS — F4310 Post-traumatic stress disorder, unspecified: Secondary | ICD-10-CM | POA: Diagnosis not present

## 2024-01-09 DIAGNOSIS — Z789 Other specified health status: Secondary | ICD-10-CM

## 2024-01-09 DIAGNOSIS — F1420 Cocaine dependence, uncomplicated: Secondary | ICD-10-CM | POA: Diagnosis not present

## 2024-01-09 DIAGNOSIS — I132 Hypertensive heart and chronic kidney disease with heart failure and with stage 5 chronic kidney disease, or end stage renal disease: Secondary | ICD-10-CM | POA: Diagnosis present

## 2024-01-09 DIAGNOSIS — R0789 Other chest pain: Secondary | ICD-10-CM | POA: Diagnosis present

## 2024-01-09 DIAGNOSIS — F1911 Other psychoactive substance abuse, in remission: Secondary | ICD-10-CM | POA: Diagnosis present

## 2024-01-09 DIAGNOSIS — I10 Essential (primary) hypertension: Secondary | ICD-10-CM | POA: Diagnosis not present

## 2024-01-09 DIAGNOSIS — I358 Other nonrheumatic aortic valve disorders: Secondary | ICD-10-CM | POA: Diagnosis present

## 2024-01-09 DIAGNOSIS — K219 Gastro-esophageal reflux disease without esophagitis: Secondary | ICD-10-CM | POA: Diagnosis present

## 2024-01-09 DIAGNOSIS — F250 Schizoaffective disorder, bipolar type: Secondary | ICD-10-CM | POA: Diagnosis present

## 2024-01-09 DIAGNOSIS — R7989 Other specified abnormal findings of blood chemistry: Secondary | ICD-10-CM | POA: Diagnosis not present

## 2024-01-09 DIAGNOSIS — I5032 Chronic diastolic (congestive) heart failure: Secondary | ICD-10-CM | POA: Diagnosis present

## 2024-01-09 DIAGNOSIS — Z8674 Personal history of sudden cardiac arrest: Secondary | ICD-10-CM | POA: Diagnosis not present

## 2024-01-09 DIAGNOSIS — E1140 Type 2 diabetes mellitus with diabetic neuropathy, unspecified: Secondary | ICD-10-CM | POA: Diagnosis present

## 2024-01-09 DIAGNOSIS — N186 End stage renal disease: Secondary | ICD-10-CM | POA: Diagnosis present

## 2024-01-09 DIAGNOSIS — R519 Headache, unspecified: Secondary | ICD-10-CM | POA: Insufficient documentation

## 2024-01-09 DIAGNOSIS — E8779 Other fluid overload: Secondary | ICD-10-CM | POA: Diagnosis present

## 2024-01-09 DIAGNOSIS — J439 Emphysema, unspecified: Secondary | ICD-10-CM | POA: Diagnosis present

## 2024-01-09 DIAGNOSIS — E669 Obesity, unspecified: Secondary | ICD-10-CM | POA: Diagnosis present

## 2024-01-09 DIAGNOSIS — E8889 Other specified metabolic disorders: Secondary | ICD-10-CM | POA: Diagnosis present

## 2024-01-09 DIAGNOSIS — I2489 Other forms of acute ischemic heart disease: Secondary | ICD-10-CM | POA: Diagnosis present

## 2024-01-09 DIAGNOSIS — J811 Chronic pulmonary edema: Secondary | ICD-10-CM | POA: Diagnosis present

## 2024-01-09 DIAGNOSIS — N2581 Secondary hyperparathyroidism of renal origin: Secondary | ICD-10-CM | POA: Diagnosis present

## 2024-01-09 DIAGNOSIS — F1421 Cocaine dependence, in remission: Secondary | ICD-10-CM | POA: Diagnosis present

## 2024-01-09 DIAGNOSIS — Z1152 Encounter for screening for COVID-19: Secondary | ICD-10-CM | POA: Diagnosis not present

## 2024-01-09 LAB — ECHOCARDIOGRAM COMPLETE
AR max vel: 1.92 cm2
AV Area VTI: 2.05 cm2
AV Area mean vel: 1.93 cm2
AV Mean grad: 7 mmHg
AV Peak grad: 16.6 mmHg
Ao pk vel: 2.04 m/s
Area-P 1/2: 3.37 cm2
Calc EF: 67.7 %
Height: 63 in
S' Lateral: 3.6 cm
Single Plane A2C EF: 69.3 %
Single Plane A4C EF: 67.3 %
Weight: 3174.62 [oz_av]

## 2024-01-09 LAB — HEPATITIS B SURFACE ANTIGEN: Hepatitis B Surface Ag: NONREACTIVE

## 2024-01-09 LAB — TROPONIN T, HIGH SENSITIVITY: Troponin T High Sensitivity: 192 ng/L (ref 0–19)

## 2024-01-09 MED ORDER — ANTICOAGULANT SODIUM CITRATE 4% (200MG/5ML) IV SOLN: 5.0000 mL | Status: DC | PRN

## 2024-01-09 MED ORDER — PROCHLORPERAZINE EDISYLATE 10 MG/2ML IJ SOLN
10.0000 mg | Freq: Once | INTRAMUSCULAR | Status: AC
  Administered 2024-01-09: 10 mg via INTRAMUSCULAR
  Filled 2024-01-09: qty 2

## 2024-01-09 MED ORDER — AMLODIPINE BESYLATE 5 MG PO TABS
5.0000 mg | ORAL_TABLET | Freq: Two times a day (BID) | ORAL | Status: DC
  Administered 2024-01-09 – 2024-01-14 (×12): 5 mg via ORAL
  Filled 2024-01-09 (×11): qty 1

## 2024-01-09 MED ORDER — BUDESON-GLYCOPYRROL-FORMOTEROL 160-9-4.8 MCG/ACT IN AERO
2.0000 | INHALATION_SPRAY | Freq: Two times a day (BID) | RESPIRATORY_TRACT | Status: DC
  Administered 2024-01-09 – 2024-01-14 (×10): 2 via RESPIRATORY_TRACT
  Filled 2024-01-09: qty 5.9

## 2024-01-09 MED ORDER — ATORVASTATIN CALCIUM 10 MG PO TABS
20.0000 mg | ORAL_TABLET | Freq: Every day | ORAL | Status: DC
  Administered 2024-01-09 – 2024-01-14 (×6): 20 mg via ORAL
  Filled 2024-01-09 (×6): qty 2

## 2024-01-09 MED ORDER — LIDOCAINE HCL (PF) 1 % IJ SOLN: 5.0000 mL | INTRAMUSCULAR | Status: DC | PRN

## 2024-01-09 MED ORDER — ALTEPLASE 2 MG IJ SOLR: 2.0000 mg | Freq: Once | INTRAMUSCULAR | Status: DC | PRN

## 2024-01-09 MED ORDER — ESCITALOPRAM OXALATE 10 MG PO TABS
10.0000 mg | ORAL_TABLET | Freq: Every day | ORAL | Status: DC
  Administered 2024-01-09 – 2024-01-14 (×6): 10 mg via ORAL
  Filled 2024-01-09 (×6): qty 1

## 2024-01-09 MED ORDER — PENTAFLUOROPROP-TETRAFLUOROETH EX AERO: 1.0000 | INHALATION_SPRAY | CUTANEOUS | Status: DC | PRN

## 2024-01-09 MED ORDER — EZETIMIBE 10 MG PO TABS
10.0000 mg | ORAL_TABLET | Freq: Every day | ORAL | Status: DC
  Administered 2024-01-09 – 2024-01-14 (×6): 10 mg via ORAL
  Filled 2024-01-09 (×5): qty 1

## 2024-01-09 MED ORDER — CYCLOBENZAPRINE HCL 10 MG PO TABS
10.0000 mg | ORAL_TABLET | Freq: Three times a day (TID) | ORAL | Status: DC | PRN
  Administered 2024-01-09 – 2024-01-14 (×5): 10 mg via ORAL
  Filled 2024-01-09 (×5): qty 1

## 2024-01-09 MED ORDER — CARVEDILOL 12.5 MG PO TABS
12.5000 mg | ORAL_TABLET | Freq: Two times a day (BID) | ORAL | Status: DC
  Administered 2024-01-09 – 2024-01-14 (×10): 12.5 mg via ORAL
  Filled 2024-01-09 (×9): qty 1

## 2024-01-09 MED ORDER — LIDOCAINE-PRILOCAINE 2.5-2.5 % EX CREA: 1.0000 | TOPICAL_CREAM | CUTANEOUS | Status: DC | PRN

## 2024-01-09 MED ORDER — CHLORHEXIDINE GLUCONATE CLOTH 2 % EX PADS
6.0000 | MEDICATED_PAD | Freq: Every day | CUTANEOUS | Status: DC
  Administered 2024-01-13: 6 via TOPICAL

## 2024-01-09 MED ORDER — HEPARIN SODIUM (PORCINE) 1000 UNIT/ML DIALYSIS: 20.0000 [IU]/kg | INTRAMUSCULAR | Status: DC | PRN

## 2024-01-09 MED ORDER — DICLOFENAC SODIUM 1 % EX GEL
4.0000 g | Freq: Three times a day (TID) | CUTANEOUS | Status: DC | PRN
  Filled 2024-01-09: qty 100

## 2024-01-09 MED ORDER — PANTOPRAZOLE SODIUM 40 MG PO TBEC
40.0000 mg | DELAYED_RELEASE_TABLET | Freq: Every day | ORAL | Status: DC
  Administered 2024-01-09 – 2024-01-14 (×6): 40 mg via ORAL
  Filled 2024-01-09 (×5): qty 1

## 2024-01-09 MED ORDER — HEPARIN SODIUM (PORCINE) 1000 UNIT/ML DIALYSIS: 1000.0000 [IU] | INTRAMUSCULAR | Status: DC | PRN

## 2024-01-09 MED ORDER — DULOXETINE HCL 30 MG PO CPEP
30.0000 mg | ORAL_CAPSULE | Freq: Every day | ORAL | Status: DC
  Administered 2024-01-09 – 2024-01-14 (×6): 30 mg via ORAL
  Filled 2024-01-09 (×6): qty 1

## 2024-01-09 MED ORDER — HEPARIN SODIUM (PORCINE) 5000 UNIT/ML IJ SOLN
5000.0000 [IU] | Freq: Three times a day (TID) | INTRAMUSCULAR | Status: DC
  Administered 2024-01-09 – 2024-01-13 (×12): 5000 [IU] via SUBCUTANEOUS
  Filled 2024-01-09 (×14): qty 1

## 2024-01-09 MED ORDER — NITROGLYCERIN 2 % TD OINT
1.0000 [in_us] | TOPICAL_OINTMENT | Freq: Once | TRANSDERMAL | Status: AC
  Administered 2024-01-09: 1 [in_us] via TOPICAL
  Filled 2024-01-09: qty 1

## 2024-01-09 MED ORDER — LAMOTRIGINE 100 MG PO TABS
100.0000 mg | ORAL_TABLET | Freq: Every day | ORAL | Status: DC
  Administered 2024-01-09 – 2024-01-14 (×6): 100 mg via ORAL
  Filled 2024-01-09 (×6): qty 1

## 2024-01-09 MED ORDER — TORSEMIDE 20 MG PO TABS
40.0000 mg | ORAL_TABLET | Freq: Two times a day (BID) | ORAL | Status: DC
  Administered 2024-01-09 – 2024-01-14 (×10): 40 mg via ORAL
  Filled 2024-01-09 (×10): qty 2

## 2024-01-09 MED ORDER — DIPHENHYDRAMINE HCL 25 MG PO CAPS
25.0000 mg | ORAL_CAPSULE | Freq: Once | ORAL | Status: AC
  Administered 2024-01-09: 25 mg via ORAL
  Filled 2024-01-09: qty 1

## 2024-01-09 NOTE — ED Notes (Addendum)
 Admitting contacted this RN and advised to not collect CBC and Creatinine since patient is refusing. This RN will attempt again to get pt to take BP medication.

## 2024-01-09 NOTE — Consult Note (Signed)
 "  Cardiology Consultation   Patient ID: Tiffany [name] MRN: [id number]; DOB: Feb 16, 1960  Admit date: 01/08/2024 Date of Consult: 01/09/2024  PCP:  Lennie Raguel MATSU, DO   [facility] Providers Cardiologist:  None        Patient Profile: Tiffany [name] is a 63 y.o. female with a hx of ESRD on HD TTS, seizure, anoxic brain injury, diabetes with diabetic neuropathy, substance use disorder history, OSA noncompliant on CPAP, HTN, HLD, GERD, CVA, schizophrenia,  who is being seen 01/09/2024 for the evaluation of chest pain at the request of Clint.  History of Present Illness: Regarding past cardiac history, she had a cath in 2019 which showed normal coronaries.  Most recent echo was last year and was largely unremarkable.  She reports that she developed chest pain beginning this morning which has been consistent throughout the day.  It was made worse after a nitro patch was placed on her chest.  It is also worse with deep breaths and worse with palpation.  It localizes to the left breast area.  She also reports worsening mucus and difficulty lying flat for the last 2 weeks.  Her last dialysis session was last Thursday and so she has missed 2 sessions.  Other review of systems is negative. In the ED she has been hemodynamically stable.  Labs are notable for elevated troponin that is flat on repeat (190 to 192).   Past Medical History:  Diagnosis Date   Abdominal pain    Acute encephalopathy 11/21/2017   Acute exacerbation of CHF (congestive heart failure) (HCC) 03/03/2022   Acute GI bleeding 03/29/2021   Acute metabolic encephalopathy 02/20/2020   Aggressive behavior    Agitation 11/22/2017   Anoxic brain injury (HCC) 09/08/2016   C. Arrest due to respiratory failure and COPD exacerbation   Anxiety    Arthritis    all over (04/10/2016)   Asthma 10/18/2010   Binge eating disorder    Blood loss anemia 04/24/2011   CAP (community acquired pneumonia) 06/22/2015   Cardiac  arrest (HCC) 09/08/2016   PEA   Carotid artery stenosis    1-39% bilateral by dopplers 11/2016   Chronic diastolic (congestive) heart failure (HCC)    Chronic pain syndrome 06/18/2012   Chronic post-traumatic stress disorder (PTSD) 05/27/2018   Chronic respiratory failure with hypoxia and hypercapnia (HCC) 06/22/2015   TRILOGY Vent >AVAPA-ES., Vt target 200-400, Max P 30 , PS max 20 , PS min 6-10 , E Max 6, E Min 4, Rate Auto AVAPS Rate 2 (titrate for pt comfort) , bleed O2 at 5l/m continuous flow .    Closed displaced fracture of fifth metacarpal bone 03/21/2018   Cocaine use disorder, severe, in sustained remission (HCC) 12/17/2015   Complication of anesthesia    decreased bp, decreased heart rate   COPD (chronic obstructive pulmonary disease) (HCC) 07/08/2014   Delusional disorder, persecutory type (HCC) 06/26/2021   Depression    Diabetic neuropathy (HCC) 04/24/2011   Difficulty with speech 01/24/2018   Disorder of nervous system    Drug abuse (HCC) 11/21/2017   Dyslipidemia 04/24/2011   E. coli UTI 02/20/2020   Elevated troponin 04/28/2012   Emphysema    Encephalopathy 11/21/2017   Essential hypertension 03/22/2016   Facet arthropathy, lumbar 06/12/2022   Fibula fracture 07/10/2016   Frequent falls 10/11/2017   GERD (gastroesophageal reflux disease)    GI bleed 03/30/2021   Gout 04/11/2017   Heart attack (HCC) 1980s   Hematochezia  History of blood transfusion 1994   couldn't stop bleeding from my period   History of drug abuse in remission (HCC) 11/28/2015   Quit in 2017   Hyperlipidemia 03/31/2021   Hyperlipidemia LDL goal <70    Hypocalcemia    Hypokalemia    Hypomagnesemia    Incontinence    Manic depression (HCC)    Morbid obesity (HCC) 10/18/2010   Obstructive sleep apnea 10/18/2010   On home oxygen  therapy    6L; 24/7 (04/10/2016)   OSA on CPAP    wear mask sometimes (04/10/2016)   Painless rectal bleeding 12/31/2015   Paranoid (HCC)     sometimes; I'm on RX for it (04/10/2016)   Periumbilical abdominal pain    Prolonged Q-T interval on ECG    Rectal bleeding 12/31/2015   Recurrent syncope 07/09/2021   Rhabdomyolysis 06/16/2021   Schizoaffective disorder, bipolar type (HCC) 04/05/2018   Seasonal allergies    Seborrheic keratoses 12/31/2013   Seizures (HCC)    don't know what kind; last one was ~ 1 yr ago (04/10/2016)   Sinus bradycardia    Skin ulcer of sacrum, limited to breakdown of skin (HCC) 03/08/2022   Spondylolisthesis at L3-L4 level 06/12/2022   Stroke (HCC) 1980s   denies residual on 04/10/2016   Thrush 09/19/2013   Type 2 diabetes mellitus (HCC) 10/18/2010    Past Surgical History:  Procedure Laterality Date   A/V FISTULAGRAM N/A 02/01/2023   Procedure: A/V Fistulagram;  Surgeon: Norine Manuelita LABOR, MD;  Location: [facility] INVASIVE CV LAB;  Service: Cardiovascular;  Laterality: N/A;   AV FISTULA PLACEMENT Left 12/21/2022   Procedure: INSERTION OF LEFT ARM ARTERIOVENOUS (AV) GORE-TEX GRAFT;  Surgeon: Magda Debby SAILOR, MD;  Location: [facility] OR;  Service: Vascular;  Laterality: Left;   CESAREAN SECTION  1997   COLONOSCOPY WITH PROPOFOL  N/A 04/01/2021   Procedure: COLONOSCOPY WITH PROPOFOL ;  Surgeon: Rollin Dover, MD;  Location: [facility] ENDOSCOPY;  Service: Gastroenterology;  Laterality: N/A;  Rectal bleeding with drop in hemoglobin to 7.2 g/dL   HERNIA REPAIR     IR CHOLANGIOGRAM EXISTING TUBE  07/20/2016   IR PERC CHOLECYSTOSTOMY  05/10/2016   IR RADIOLOGIST EVAL & MGMT  06/08/2016   IR RADIOLOGIST EVAL & MGMT  06/29/2016   IR SINUS/FIST TUBE CHK-NON GI  07/12/2016   RIGHT/LEFT HEART CATH AND CORONARY ANGIOGRAPHY N/A 06/19/2017   Procedure: RIGHT/LEFT HEART CATH AND CORONARY ANGIOGRAPHY;  Surgeon: Cherrie Toribio SAUNDERS, MD;  Location: [facility] INVASIVE CV [facility];  Service: Cardiovascular;  Laterality: N/A;   TIBIA IM NAIL INSERTION Right 07/12/2016   Procedure: INTRAMEDULLARY (IM) NAIL RIGHT TIBIA;  Surgeon: Jerri Kay HERO, MD;   Location: [facility] OR;  Service: Orthopedics;  Laterality: Right;   UMBILICAL HERNIA REPAIR  ~ 1963   that's why I don't have a belly button   VAGINAL HYSTERECTOMY         Scheduled Meds:  amLODipine   5 mg Oral BID   atorvastatin   20 mg Oral Daily   budesonide -glycopyrrolate -formoterol   2 puff Inhalation BID   carvedilol   12.5 mg Oral BID WC   diphenhydrAMINE   25 mg Oral Once   DULoxetine   30 mg Oral Daily   escitalopram   10 mg Oral Daily   ezetimibe   10 mg Oral Daily   heparin   5,000 Units Subcutaneous Q8H   lamoTRIgine   100 mg Oral Daily   pantoprazole   40 mg Oral Daily   prochlorperazine   10 mg Intramuscular Once   torsemide   40 mg  Oral BID   Continuous Infusions:  PRN Meds:   Allergies:   Allergies[1]  Social History:   Social History   Socioeconomic History   Marital status: Widowed    Spouse name: Not on file   Number of children: 3   Years of education: 12th   Highest education level: 12th grade  Occupational History   Occupation: disabled    Comment: factory production  Tobacco Use   Smoking status: Former    Current packs/day: 0.00    Average packs/day: 1.5 packs/day for 39.1 years (58.6 ttl pk-yrs)    Types: Cigarettes    Start date: 03/13/1977    Quit date: 04/10/2016    Years since quitting: 7.7    Passive exposure: Past   Smokeless tobacco: Never  Vaping Use   Vaping status: Never Used  Substance and Sexual Activity   Alcohol use: No    Alcohol/week: 0.0 standard drinks of alcohol   Drug use: Not Currently    Types: Cocaine    Comment: 04/10/2016 last used cocaine back in November 2017   Sexual activity: Not Currently    Birth control/protection: Surgical  Other Topics Concern   Not on file  Social History Narrative   Has 1 son, [name]   Lives with son and his boyfriend   Her house has ramps and handrails should she ever needs them.    Her mother lives down the street from her and is a good support person in addition to her son.   She drives  herself, has private transportation.    Cocaine free since 02/24/16, smoke free since 04/10/16   Dialysis M-W-F   Social Drivers of Health   Tobacco Use: Medium Risk (01/08/2024)   Patient History    Smoking Tobacco Use: Former    Smokeless Tobacco Use: Never    Passive Exposure: Past  Physicist, Medical Strain: Low Risk (07/11/2022)   Overall Financial Resource Strain (CARDIA)    Difficulty of Paying Living Expenses: Not very hard  Food Insecurity: Food Insecurity Present (08/15/2023)   Epic    Worried About Programme Researcher, Broadcasting/film/video in the Last Year: Sometimes true    Ran Out of Food in the Last Year: Sometimes true  Transportation Needs: No Transportation Needs (08/15/2023)   Epic    Lack of Transportation (Medical): No    Lack of Transportation (Non-Medical): No  Physical Activity: Inactive (07/11/2022)   Exercise Vital Sign    Days of Exercise per Week: 0 days    Minutes of Exercise per Session: 0 min  Stress: Stress Concern Present (07/11/2022)   [employer] of Occupational Health - Occupational Stress Questionnaire    Feeling of Stress : To some extent  Social Connections: Socially Isolated (06/28/2023)   Social Connection and Isolation Panel    Frequency of Communication with Friends and Family: Three times a week    Frequency of Social Gatherings with Friends and Family: Three times a week    Attends Religious Services: Never    Active Member of Clubs or Organizations: No    Attends Banker Meetings: Never    Marital Status: Widowed  Intimate Partner Violence: Not At Risk (08/15/2023)   Epic    Fear of Current or Ex-Partner: No    Emotionally Abused: No    Physically Abused: No    Sexually Abused: No  Depression (PHQ2-9): High Risk (01/04/2024)   Depression (PHQ2-9)    PHQ-2 Score: 17  Alcohol Screen: Low Risk (08/15/2023)  Alcohol Screen    Last Alcohol Screening Score (AUDIT): 0  Housing: Low Risk (08/15/2023)   Epic    Unable to Pay for Housing in the  Last Year: No    Number of Times Moved in the Last Year: 0    Homeless in the Last Year: No  Utilities: Not At Risk (08/15/2023)   Epic    Threatened with loss of utilities: No  Health Literacy: Not on file    Family History:    Family History  Problem Relation Age of Onset   Cancer Mother        lung   Depression Mother    Cancer Father        prostate   Depression Sister    Anxiety disorder Sister    Schizophrenia Sister    Bipolar disorder Sister    Depression Sister    Depression Brother    Heart failure Other        cousin     ROS:  Please see the history of present illness.   All other ROS reviewed and negative.     Physical Exam/Data: Vitals:   01/08/24 2214 01/09/24 0015 01/09/24 0145 01/09/24 0238  BP:  (!) 156/111 (!) 154/89   Pulse:  86 85   Resp:  (!) 26 (!) 28   Temp:    98.7 F (37.1 C)  TempSrc:    Oral  SpO2:  99% 99%   Weight: 90 kg     Height: 5' 3 (1.6 m)      No intake or output data in the 24 hours ending 01/09/24 0313    01/08/2024   10:14 PM 01/04/2024    8:42 AM 10/18/2023    4:09 PM  Last 3 Weights  Weight (lbs) 198 lb 6.6 oz -- 196 lb 13.9 oz  Weight (kg) 90 kg -- 89.3 kg     Body mass index is 35.15 kg/m.  General:  Well nourished, well developed, in no acute distress HEENT: normal Neck: no JVD Vascular: No carotid bruits; Distal pulses 2+ bilaterally Cardiac:  normal S1, S2; RRR; no murmur  Lungs:  clear to auscultation bilaterally, no wheezing, rhonchi or rales  Abd: soft, nontender, no hepatomegaly  Ext: no edema Musculoskeletal:  No deformities, BUE and BLE strength normal and equal Skin: warm and dry  Neuro:  CNs 2-12 intact, no focal abnormalities noted Psych:  Normal affect   EKG:  The EKG was personally reviewed and demonstrates: No evidence of acute ischemia Telemetry:  Telemetry was personally reviewed and demonstrates: Sinus rhythm  Relevant CV Studies: See above  Laboratory Data: High Sensitivity  Troponin:  No results for input(s): TROPONINIHS in the last 720 hours.  Recent Labs  Lab 01/08/24 2221 01/09/24 0113  TRNPT 190* 192*      Chemistry Recent Labs  Lab 01/08/24 2221  NA 135  K 3.9  CL 95*  CO2 23  GLUCOSE 81  BUN 77*  CREATININE 8.55*  CALCIUM  10.1  GFRNONAA 5*  ANIONGAP 17*    No results for input(s): PROT, ALBUMIN , AST, ALT, ALKPHOS, BILITOT in the last 168 hours. Lipids No results for input(s): CHOL, TRIG, HDL, LABVLDL, LDLCALC, CHOLHDL in the last 168 hours.  Hematology Recent Labs  Lab 01/08/24 2221  WBC 8.5  RBC 3.97  HGB 11.2*  HCT 36.1  MCV 90.9  MCH 28.2  MCHC 31.0  RDW 16.6*  PLT 283   Thyroid  No results for input(s): TSH, FREET4 in  the last 168 hours.  BNPNo results for input(s): BNP, PROBNP in the last 168 hours.  DDimer No results for input(s): DDIMER in the last 168 hours.  Radiology/Studies:  DG Chest 2 View Result Date: 01/08/2024 EXAM: 2 VIEW(S) XRAY OF THE CHEST 01/08/2024 10:54:00 PM COMPARISON: 10/18/2023 CLINICAL HISTORY: cp FINDINGS: LINES, TUBES AND DEVICES: Tunneled right IJ hemodialysis catheter in place to distal SVC, unchanged. LUNGS AND PLEURA: Stable elevated right hemidiaphragm. No focal pulmonary opacity. No pleural effusion. No pneumothorax. HEART AND MEDIASTINUM: No acute abnormality of the cardiac and mediastinal silhouettes. BONES AND SOFT TISSUES: Vertebral endplate spurring at multiple levels in mid and lower thoracic spine. No acute osseous abnormality. IMPRESSION: 1. No acute cardiopulmonary abnormality. Electronically signed by: Norman Gatlin MD 01/08/2024 10:57 PM EST RP Workstation: [id number]     Assessment and Plan: Elevated troponin The elevated troponin likely represents nonischemic myocardial injury. [name] certainly high risk given dialysis  However, her chest pain is noncardiac in nature.  It is pleuritic and worsens with palpation and nitroglycerin .  Her troponin is  elevated but flat.   This likely represents myocardial injury in the setting of renal failure versus secondary to missed dialysis sessions.  I suspect that her history of mucus and difficulty lying flat along with this vague chest discomfort is related to her missed dialysis sessions.  Recommend repeating her troponin x 1 given the fact that she is having chest pain.  Low suspicion for ACS at this time but can reevaluate pending third troponin. - repeat troponin - EKG if change I character of chest pain - ok to discontinue nitroglycerin  given patient is having worse pain and a headache with it   For questions or updates, please contact [facility] Please consult [email or web address] for contact info under   Signed, Dorn CHRISTELLA Flemings, MD  01/09/2024 3:13 AM     [1]  Allergies Allergen Reactions   Dorethia Beth ] Other (See Comments)    GI bleed   Deltasone  [Prednisone ] Anaphylaxis and Swelling   Latuda [Lurasidone Hcl] Anaphylaxis   Lortab [Hydrocodone -Acetaminophen ] Hives and Shortness Of Breath   Magnesium -Containing Compounds Anaphylaxis and Other (See Comments)    Tolerated Ensure   Ultram  [Tramadol ] Anaphylaxis, Swelling and Rash   Codeine Nausea And Vomiting and Rash   Desyrel  [Trazodone ] Other (See Comments)    Paranoia    Peanut (Diagnostic) Hives and Other (See Comments)    Raw peanuts only  OK to eat peanut butter.   Apresoline  [Hydralazine ] Other (See Comments)    Muscle spasms   Sulfa Antibiotics Itching   Tape Rash   Topamax  [Topiramate ] Other (See Comments)    Paranoia   "
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
## 2024-01-09 NOTE — H&P (Signed)
 "    Hospital Admission History and Physical Service Pager: [phone]  Patient name: Tiffany [name] Medical record number: [id number] Date of Birth: [birth year] Age: 63 y.o. Gender: female  Primary Care Provider: Lennie Raguel MATSU, DO Consultants: Nephrology, Cards Code Status: Full Code  Chief Complaint: Chest pain  Assessment and Plan: Tiffany [name] is a 63 y.o. female pertinent medical history of ESRD on HD (TTS), GERD, CAD, diastolic heart failure, OSA with nonadherence on CPAP, anoxic brain injury 2/2 cardiac arrest August 2018, and schizoaffective disorder.  Presenting with left-sided chest pain.  EKG is normal sinus without STEMI (similar to prior), troponins trended flat, CXR without acute cardiopulmonary abnormalities (similar to prior), pain is not reproducible on exam, lung exam is overall unremarkable and patient is breathing comfortably on room air. Given her comorbidities, ongoing chest pain and significant cardiac history plan to admit for cardiac workup and reinitiation of dialysis.  At this time, lower suspicion for an acute NSTEMI with troponins flat (favored demand ischemia in setting of missed dialysis/diastolic heart failure).  Despite COPD, without wheezing or increased sputum production lower concern for COPD exacerbation.  If patient develops hemodynamic instability, particularly with respiratory status would consider CT PE with coordination for hemodialysis.  Assessment & Plan Chest pain - Admit to FMTS, telemetry, attending Dr. Eward - Cardiology consult, appreciate recommendations - Echocardiogram - No improvement with nitroglycerin , will hold off on redose - Consider heparin  drip if pain is not improving, appreciate cardiology recs - Telemetry 24 hours ESRD (end stage renal disease) on dialysis [facility]) - Consult nephrology for HD, appreciate recs - Lower extremity edema, will dose home torsemide  (unable to obtain IV access) Headache Migraine headache, given ESRD  Limited with options.  Additionally with CAD, advised against triptan use.  No IV access at this time.   -Trial IM Compazine  10 mg, and 25 mg Benadryl -can repeat as needed. Chronic health problem Mood Disorder: Lexapro  10 mg daily, Cymbalta  30 mg daily, Lamictal  100 mg daily Schizoaffective Disorder: Reports she is not taking Risperdal , did not order at this time PTSD: Reports prazosin  does not help her, reports she does not want to take this medication HTN: Amlodipine  5 mg twice daily GERD: Pantoprazole  Diastolic heart failure: Continue 12.5 mg carvedilol  twice daily HLD: Lipitor  20 mg daily, Zetia  10 mg daily COPD: Restart Trelegy equivalent (Breztri )   FEN/GI: Renal diet VTE Prophylaxis: Heparin   Disposition: Telemetry  History of Present Illness:  Tiffany [name] is a 63 y.o. female presenting with left-sided chest tightness/heaviness that started the morning of 12/23.  Pain is constant, without radiation and not reproducible.  She missed dialysis on Tuesday, last dialysis was Thursday of last week.  In the ED, troponins obtained initial troponin of 190.  CBC/BMP fairly unremarkable aside from her known ESRD.  CXR unremarkable.  Called for admission.  Still pending second troponin, requested cardiology follow along given her complex cardiac history.  Review Of Systems: Per HPI.  Pertinent Past Medical History:  See above  Pertinent Past Surgical History:  Remainder reviewed in history tab.   Pertinent Social History: Tobacco use: Former Alcohol use: Denies Other Substance use: Denies Lives with mother  Pertinent Family History:  Remainder reviewed in history tab.   Important Outpatient Medications:    Remainder reviewed in medication history.   Objective: BP (!) 154/89   Pulse 85   Temp 98.6 F (37 C)   Resp (!) 28   Ht 5' 3 (1.6 m)   Wt  90 kg   SpO2 99%   BMI 35.15 kg/m  Exam: General: NAD, chronically ill-appearing Eyes: EOM intact ENTM:  Normocephalic, atraumatic head Cardiovascular: RRR, no murmurs Respiratory: CTAB, normal work of breathing on room air Extremities: Trace pitting edema bilateral lower extremities Neuro: Alert oriented x 4, no overt focal neurologic deficits Psych: Normal mood and affect  Labs:  CBC BMET  Recent Labs  Lab 01/08/24 2221  WBC 8.5  HGB 11.2*  HCT 36.1  PLT 283   Recent Labs  Lab 01/08/24 2221  NA 135  K 3.9  CL 95*  CO2 23  BUN 77*  CREATININE 8.55*  GLUCOSE 81  CALCIUM  10.1     EKG: Normal sinus rhythm, no STEMI   Imaging Studies Performed:  Chest x-ray: No acute cardiopulmonary disease (similar to prior)   Howell Lunger, DO 01/09/2024, 2:02 AM PGY-3, [facility]  FPTS Intern pager: [phone], text pages welcome Secure chat group [facility] Teaching Service  "

## 2024-01-09 NOTE — Assessment & Plan Note (Addendum)
-   Admit to FMTS, telemetry, attending Dr. Eward - Cardiology consult, appreciate recommendations - Echocardiogram - No improvement with nitroglycerin , will hold off on redose - Consider heparin  drip if pain is not improving, appreciate cardiology recs - Telemetry 24 hours

## 2024-01-09 NOTE — Progress Notes (Addendum)
 Brief rounding note:  Cardiology consulted for chest pain evaluation.  Please see the original consultation there from earlier this morning.  Patient seen and examined at bedside. Denies anginal chest pain. The pain that she describes as pleuritic, has a productive cough, and reproducible on physical examination. Notably she is also skipped to dialysis sessions last Thursday and the prior Saturday.  She presented to the hospital with a blood pressure of 186/166. Her high sensitive troponins were 190 followed by 192 at a serum creatinine level of 8.55 EKG shows a sinus rhythm at 85 bpm, no injury pattern  Echocardiogram: 02/04/2022  1. Left ventricular ejection fraction, by estimation, is 60 to 65%. The  left ventricle has normal function. The left ventricle has no regional  wall motion abnormalities. The left ventricular internal cavity size was  mildly dilated. There is mild left  ventricular hypertrophy. Left ventricular diastolic parameters are  consistent with Grade II diastolic dysfunction (pseudonormalization).  Elevated left atrial pressure.   2. Right ventricular systolic function is normal. The right ventricular  size is normal. There is mildly elevated pulmonary artery systolic  pressure. The estimated right ventricular systolic pressure is 39.1 mmHg.   3. Left atrial size was severely dilated.   4. Right atrial size was mildly dilated.   5. The mitral valve is normal in structure. Trivial mitral valve  regurgitation. No evidence of mitral stenosis.   6. The aortic valve is tricuspid. Aortic valve regurgitation is not  visualized. Aortic valve sclerosis is present, with no evidence of aortic  valve stenosis.   7. The inferior vena cava is dilated in size with >50% respiratory  variability, suggesting right atrial pressure of 8 mmHg.   Physical examination: General: Appears older than stated age, hemodynamically stable, no acute distress HEENT: Normocephalic,  atraumatic, no JVP, dialysis catheter right anterior chest wall Lungs: Clear to auscultation bilaterally. Heart: Regular, positive S1-S2, no murmurs rubs or gallops appreciated. Abdomen: Soft, nontender, nondistended, positive bowel sounds in all 4 quadrants Extremities: No lower extremity swelling, warm to touch Neuro: Nonfocal Psych: Appropriate and cooperative  Impression and plan: Precordial pain. Elevated troponins likely secondary to CKD not suggestive of ACS End-stage renal disease on hemodialysis Tuesday Thursdays and Saturdays. Hypertension. Hyperlipidemia. History of CVA  From a cardiology standpoint she does not endorse anginal chest pain. Hs troponins are elevated but essentially flat not suggestive of ACS and in the setting of missed 2 dialysis sessions and a serum creatinine of 8.55. EKG is nonischemic Echocardiogram is already ordered and pending. As long as echocardiogram results are favorable i.e. no significant change in EF or regional wall motion abnormalities would recommend outpatient follow-up. Cardiology will sign off for now please reach out if any questions or concerns arise.  No charge  Dr. [name] 9:31 AM

## 2024-01-09 NOTE — ED Notes (Signed)
 Pt refusing for this RN to collect labs. Pt also stating that her BP is not right and does not want BP medication that is currently ordered. Admitting paged and awaiting response.

## 2024-01-09 NOTE — Consult Note (Signed)
 [facility] Renal Consultation Note    Indication for Consultation:  Management of ESRD/hemodialysis, anemia, hypertension/volume, and secondary hyperparathyroidism.  HPI: Tiffany [name] is a 63 y.o. female with ESRD, HTN, Hx CVA, T2DM, COPD, schizoaffective/mood disorder who was admitted with pulm edema and chest pain.  Presented to ED last night with c/o sharp chest pains x 1 day, as well as cough and dyspnea. No fever or chills. No abd pain, N/V/D. She has been intermittently non-compliant with dialysis, had been doing better, but has now missed nearly 1 week (last HD was 12/18). BP was high on admit, afebrile. Labs with Na 135, K 3.9, BUN 77, Trop 190 -> 192, WBC 8.5, Hgb 11.2. Flu/COVID negative. CXR clear. She was admitted. Cardiology consulted, felt likely demand ischemia rather than NSTEMI.  Seen in ED room today - chest pain is still present but a little improved. [name] not requiring nasal O2 at the moment.  Dialyzes on TTS schedule at [facility]. Last HD there was 12/18. Has not been achieving her dry weight. Uses TDC for her access.  Past Medical History:  Diagnosis Date   Abdominal pain    Acute encephalopathy 11/21/2017   Acute exacerbation of CHF (congestive heart failure) (HCC) 03/03/2022   Acute GI bleeding 03/29/2021   Acute metabolic encephalopathy 02/20/2020   Aggressive behavior    Agitation 11/22/2017   Anoxic brain injury (HCC) 09/08/2016   C. Arrest due to respiratory failure and COPD exacerbation   Anxiety    Arthritis    all over (04/10/2016)   Asthma 10/18/2010   Binge eating disorder    Blood loss anemia 04/24/2011   CAP (community acquired pneumonia) 06/22/2015   Cardiac arrest (HCC) 09/08/2016   PEA   Carotid artery stenosis    1-39% bilateral by dopplers 11/2016   Chronic diastolic (congestive) heart failure (HCC)    Chronic pain syndrome 06/18/2012   Chronic post-traumatic stress disorder (PTSD) 05/27/2018   Chronic  respiratory failure with hypoxia and hypercapnia (HCC) 06/22/2015   TRILOGY Vent >AVAPA-ES., Vt target 200-400, Max P 30 , PS max 20 , PS min 6-10 , E Max 6, E Min 4, Rate Auto AVAPS Rate 2 (titrate for pt comfort) , bleed O2 at 5l/m continuous flow .    Closed displaced fracture of fifth metacarpal bone 03/21/2018   Cocaine use disorder, severe, in sustained remission (HCC) 12/17/2015   Complication of anesthesia    decreased bp, decreased heart rate   COPD (chronic obstructive pulmonary disease) (HCC) 07/08/2014   Delusional disorder, persecutory type (HCC) 06/26/2021   Depression    Diabetic neuropathy (HCC) 04/24/2011   Difficulty with speech 01/24/2018   Disorder of nervous system    Drug abuse (HCC) 11/21/2017   Dyslipidemia 04/24/2011   E. coli UTI 02/20/2020   Elevated troponin 04/28/2012   Emphysema    Encephalopathy 11/21/2017   Essential hypertension 03/22/2016   Facet arthropathy, lumbar 06/12/2022   Fibula fracture 07/10/2016   Frequent falls 10/11/2017   GERD (gastroesophageal reflux disease)    GI bleed 03/30/2021   Gout 04/11/2017   Heart attack (HCC) 1980s   Hematochezia    History of blood transfusion 1994   couldn't stop bleeding from my period   History of drug abuse in remission (HCC) 11/28/2015   Quit in 2017   Hyperlipidemia 03/31/2021   Hyperlipidemia LDL goal <70    Hypocalcemia    Hypokalemia    Hypomagnesemia    Incontinence  Manic depression (HCC)    Morbid obesity (HCC) 10/18/2010   Obstructive sleep apnea 10/18/2010   On home oxygen  therapy    6L; 24/7 (04/10/2016)   OSA on CPAP    wear mask sometimes (04/10/2016)   Painless rectal bleeding 12/31/2015   Paranoid (HCC)    sometimes; I'm on RX for it (04/10/2016)   Periumbilical abdominal pain    Prolonged Q-T interval on ECG    Rectal bleeding 12/31/2015   Recurrent syncope 07/09/2021   Rhabdomyolysis 06/16/2021   Schizoaffective disorder, bipolar type (HCC) 04/05/2018    Seasonal allergies    Seborrheic keratoses 12/31/2013   Seizures (HCC)    don't know what kind; last one was ~ 1 yr ago (04/10/2016)   Sinus bradycardia    Skin ulcer of sacrum, limited to breakdown of skin (HCC) 03/08/2022   Spondylolisthesis at L3-L4 level 06/12/2022   Stroke (HCC) 1980s   denies residual on 04/10/2016   Thrush 09/19/2013   Type 2 diabetes mellitus (HCC) 10/18/2010   Past Surgical History:  Procedure Laterality Date   A/V FISTULAGRAM N/A 02/01/2023   Procedure: A/V Fistulagram;  Surgeon: Norine Manuelita LABOR, MD;  Location: [facility] INVASIVE CV LAB;  Service: Cardiovascular;  Laterality: N/A;   AV FISTULA PLACEMENT Left 12/21/2022   Procedure: INSERTION OF LEFT ARM ARTERIOVENOUS (AV) GORE-TEX GRAFT;  Surgeon: Magda Debby SAILOR, MD;  Location: [facility] OR;  Service: Vascular;  Laterality: Left;   CESAREAN SECTION  1997   COLONOSCOPY WITH PROPOFOL  N/A 04/01/2021   Procedure: COLONOSCOPY WITH PROPOFOL ;  Surgeon: Rollin Dover, MD;  Location: [facility] ENDOSCOPY;  Service: Gastroenterology;  Laterality: N/A;  Rectal bleeding with drop in hemoglobin to 7.2 g/dL   HERNIA REPAIR     IR CHOLANGIOGRAM EXISTING TUBE  07/20/2016   IR PERC CHOLECYSTOSTOMY  05/10/2016   IR RADIOLOGIST EVAL & MGMT  06/08/2016   IR RADIOLOGIST EVAL & MGMT  06/29/2016   IR SINUS/FIST TUBE CHK-NON GI  07/12/2016   RIGHT/LEFT HEART CATH AND CORONARY ANGIOGRAPHY N/A 06/19/2017   Procedure: RIGHT/LEFT HEART CATH AND CORONARY ANGIOGRAPHY;  Surgeon: Cherrie Toribio SAUNDERS, MD;  Location: [facility] INVASIVE CV LAB;  Service: Cardiovascular;  Laterality: N/A;   TIBIA IM NAIL INSERTION Right 07/12/2016   Procedure: INTRAMEDULLARY (IM) NAIL RIGHT TIBIA;  Surgeon: Jerri Kay HERO, MD;  Location: [facility] OR;  Service: Orthopedics;  Laterality: Right;   UMBILICAL HERNIA REPAIR  ~ 1963   that's why I don't have a belly button   VAGINAL HYSTERECTOMY     Family History  Problem Relation Age of Onset   Cancer Mother        lung   Depression Mother     Cancer Father        prostate   Depression Sister    Anxiety disorder Sister    Schizophrenia Sister    Bipolar disorder Sister    Depression Sister    Depression Brother    Heart failure Other        cousin   Social History:  reports that she quit smoking about 7 years ago. Her smoking use included cigarettes. She started smoking about 46 years ago. She has a 58.6 pack-year smoking history. She has been exposed to tobacco smoke. She has never used smokeless tobacco. She reports that she does not currently use drugs after having used the following drugs: Cocaine. She reports that she does not drink alcohol.  ROS: As per HPI otherwise negative.  Physical Exam: Vitals:   01/09/24  9278 01/09/24 0722 01/09/24 0800 01/09/24 0815  BP: (!) 140/92 (!) 140/92 (!) 143/98 (!) 150/96  Pulse: 81 84 80 80  Resp: 17  18 18   Temp: 97.8 F (36.6 C)     TempSrc: Oral     SpO2: 96%  96% 93%  Weight:      Height:         General: Well developed, well nourished, in no acute distress. Room air Head: Normocephalic, atraumatic, sclera non-icteric, mucus membranes are moist. Neck: Supple without lymphadenopathy/masses. JVD not elevated. Lungs: Scattered rhonchi Heart: RRR with normal S1, S2. No murmurs, rubs, or gallops appreciated. Abdomen: Soft, non-tender, non-distended with normoactive bowel sounds.  Musculoskeletal:  Strength and tone appear normal for age. Lower extremities: 1+ BLE edema Neuro: Alert and oriented X 3. Moves all extremities spontaneously. Psych:  Responds to questions appropriately with a normal affect. Dialysis Access: TDC in R chest, non-tender  Allergies[1] Prior to Admission medications  Medication Sig Start Date End Date Taking? Authorizing Provider  amLODipine  (NORVASC ) 5 MG tablet Take 1 tablet (5 mg total) by mouth 2 (two) times daily. 01/04/24  Yes Baker, Amelia G, DO  atorvastatin  (LIPITOR ) 20 MG tablet Take 1 tablet (20 mg total) by mouth daily. Patient  taking differently: Take 20 mg by mouth in the morning and at bedtime. 07/07/23 01/09/24 Yes Austria, Camellia PARAS, DO  calcitRIOL  (ROCALTROL ) 0.5 MCG capsule Take 1 capsule (0.5 mcg total) by mouth daily. 07/07/23  Yes Austria, Eric J, DO  carvedilol  (COREG ) 12.5 MG tablet Take 1 tablet (12.5 mg total) by mouth 2 (two) times daily. 07/07/23 01/09/24 Yes Austria, Eric J, DO  dicyclomine  (BENTYL ) 10 MG capsule Take 1 capsule (10 mg total) by mouth 3 (three) times daily. 11/26/23  Yes   docusate sodium  (COLACE) 100 MG capsule Take 1 capsule (100 mg total) by mouth daily. 09/07/23  Yes Jadapalle, Sree, MD  DULoxetine  (CYMBALTA ) 30 MG capsule Take 1 capsule (30 mg total) by mouth daily. 09/06/23  Yes Jadapalle, Sree, MD  escitalopram  (LEXAPRO ) 10 MG tablet Take 1 tablet (10 mg total) by mouth daily. 09/06/23 01/09/24 Yes Jadapalle, Sree, MD  ezetimibe  (ZETIA ) 10 MG tablet Take 1 tablet (10 mg total) by mouth daily. 09/07/23  Yes Donnelly Mellow, MD  ferrous sulfate  325 (65 FE) MG tablet Take 1 tablet (325 mg total) by mouth daily. 07/07/23 07/01/24 Yes Austria, Eric J, DO  Fluticasone -Umeclidin-Vilant (TRELEGY ELLIPTA ) 100-62.5-25 MCG/ACT AEPB Inhale 1 puff into the lungs daily. 10/02/23  Yes Doretha Folks, MD  folic acid  (FOLVITE ) 1 MG tablet Take 1 tablet (1 mg total) by mouth daily. 07/07/23  Yes Austria, Eric J, DO  lamoTRIgine  (LAMICTAL ) 100 MG tablet Take 100 mg by mouth at bedtime.   Yes [provider]  metolazone  (ZAROXOLYN ) 2.5 MG tablet Take 1 tablet (2.5 mg total) by mouth every Monday, Wednesday, and Friday. 07/09/23 01/09/24 Yes Austria, Camellia PARAS, DO  omeprazole  (PRILOSEC) 40 MG capsule Take 1 capsule (40 mg total) by mouth every morning. 07/07/23 01/09/24 Yes Austria, Eric J, DO  prazosin  (MINIPRESS ) 2 MG capsule Take 1 capsule (2 mg total) by mouth at bedtime. 02/26/23  Yes   torsemide  (DEMADEX ) 20 MG tablet Take 40 mg by mouth 2 (two) times daily. 12/06/23  Yes [provider]   amLODipine  (NORVASC ) 10 MG tablet Take 10 mg by mouth daily. Patient not taking: Reported on 01/09/2024    [provider]  ARIPiprazole  (ABILIFY ) 2 MG tablet Take 2 mg  by mouth daily. Patient not taking: Reported on 01/09/2024 01/07/24   [provider]  epoetin  alfa (EPOGEN ) 10000 UNIT/ML injection Inject 1 mL (10,000 Units total) into the vein every Monday, Wednesday, and Friday with hemodialysis. 11/22/22   Cam Charlie Loving, DO  potassium chloride  SA (KLOR-CON  M) 20 MEQ tablet Take 1 tablet (20 mEq total) by mouth 2 (two) times daily. Patient not taking: Reported on 01/09/2024 09/06/23   Jadapalle, Sree, MD   Current Facility-Administered Medications  Medication Dose Route Frequency Provider Last Rate Last Admin   amLODipine  (NORVASC ) tablet 5 mg  5 mg Oral BID Bronson, Martin, DO   5 mg at 01/09/24 9043   atorvastatin  (LIPITOR ) tablet 20 mg  20 mg Oral Daily Howell Lunger, DO   20 mg at 01/09/24 9043   budesonide -glycopyrrolate -formoterol  (BREZTRI ) 160-9-4.8 MCG/ACT inhaler 2 puff  2 puff Inhalation BID Howell Lunger, DO       carvedilol  (COREG ) tablet 12.5 mg  12.5 mg Oral BID WC Howell Lunger, DO   12.5 mg at 01/09/24 9277   Chlorhexidine  Gluconate Cloth 2 % PADS 6 each  6 each Topical Q0600 Dolan Mateo Larger, MD       diclofenac  Sodium (VOLTAREN ) 1 % topical gel 4 g  4 g Topical TID PRN Pray, Margaret E, MD       DULoxetine  (CYMBALTA ) DR capsule 30 mg  30 mg Oral Daily Bronson, Martin, DO   30 mg at 01/09/24 9043   escitalopram  (LEXAPRO ) tablet 10 mg  10 mg Oral Daily Bronson, Martin, DO   10 mg at 01/09/24 9043   ezetimibe  (ZETIA ) tablet 10 mg  10 mg Oral Daily Bronson, Martin, DO   10 mg at 01/09/24 9043   heparin  injection 5,000 Units  5,000 Units Subcutaneous Q8H Howell Lunger, DO   5,000 Units at 01/09/24 9444   lamoTRIgine  (LAMICTAL ) tablet 100 mg  100 mg Oral Daily Howell Lunger, DO   100 mg at 01/09/24 9043   pantoprazole  (PROTONIX ) EC  tablet 40 mg  40 mg Oral Daily Bronson, Martin, DO   40 mg at 01/09/24 9043   torsemide  (DEMADEX ) tablet 40 mg  40 mg Oral BID Bronson, Martin, DO   40 mg at 01/09/24 9277   Current Outpatient Medications  Medication Sig Dispense Refill   amLODipine  (NORVASC ) 5 MG tablet Take 1 tablet (5 mg total) by mouth 2 (two) times daily. 60 tablet 0   atorvastatin  (LIPITOR ) 20 MG tablet Take 1 tablet (20 mg total) by mouth daily. (Patient taking differently: Take 20 mg by mouth in the morning and at bedtime.) 90 tablet 0   calcitRIOL  (ROCALTROL ) 0.5 MCG capsule Take 1 capsule (0.5 mcg total) by mouth daily. 90 capsule 0   carvedilol  (COREG ) 12.5 MG tablet Take 1 tablet (12.5 mg total) by mouth 2 (two) times daily. 180 tablet 0   dicyclomine  (BENTYL ) 10 MG capsule Take 1 capsule (10 mg total) by mouth 3 (three) times daily. 270 capsule 1   docusate sodium  (COLACE) 100 MG capsule Take 1 capsule (100 mg total) by mouth daily. 10 capsule 0   DULoxetine  (CYMBALTA ) 30 MG capsule Take 1 capsule (30 mg total) by mouth daily. 30 capsule 0   escitalopram  (LEXAPRO ) 10 MG tablet Take 1 tablet (10 mg total) by mouth daily. 90 tablet 0   ezetimibe  (ZETIA ) 10 MG tablet Take 1 tablet (10 mg total) by mouth daily. 30 tablet 0   ferrous sulfate  325 (65 FE) MG tablet Take  1 tablet (325 mg total) by mouth daily. 90 tablet 3   Fluticasone -Umeclidin-Vilant (TRELEGY ELLIPTA ) 100-62.5-25 MCG/ACT AEPB Inhale 1 puff into the lungs daily. 1 each 3   folic acid  (FOLVITE ) 1 MG tablet Take 1 tablet (1 mg total) by mouth daily. 90 tablet 0   lamoTRIgine  (LAMICTAL ) 100 MG tablet Take 100 mg by mouth at bedtime.     metolazone  (ZAROXOLYN ) 2.5 MG tablet Take 1 tablet (2.5 mg total) by mouth every Monday, Wednesday, and Friday. 90 tablet 0   omeprazole  (PRILOSEC) 40 MG capsule Take 1 capsule (40 mg total) by mouth every morning. 90 capsule 0   prazosin  (MINIPRESS ) 2 MG capsule Take 1 capsule (2 mg total) by mouth at bedtime. 30 capsule 5    torsemide  (DEMADEX ) 20 MG tablet Take 40 mg by mouth 2 (two) times daily.     amLODipine  (NORVASC ) 10 MG tablet Take 10 mg by mouth daily. (Patient not taking: Reported on 01/09/2024)     ARIPiprazole  (ABILIFY ) 2 MG tablet Take 2 mg by mouth daily. (Patient not taking: Reported on 01/09/2024)     epoetin  alfa (EPOGEN ) 10000 UNIT/ML injection Inject 1 mL (10,000 Units total) into the vein every Monday, Wednesday, and Friday with hemodialysis. 1 mL 3   potassium chloride  SA (KLOR-CON  M) 20 MEQ tablet Take 1 tablet (20 mEq total) by mouth 2 (two) times daily. (Patient not taking: Reported on 01/09/2024) 60 tablet 0   Labs: Basic Metabolic Panel: Recent Labs  Lab 01/08/24 2221  NA 135  K 3.9  CL 95*  CO2 23  GLUCOSE 81  BUN 77*  CREATININE 8.55*  CALCIUM  10.1   CBC: Recent Labs  Lab 01/08/24 2221  WBC 8.5  HGB 11.2*  HCT 36.1  MCV 90.9  PLT 283   Studies/Results: DG Chest 2 View Result Date: 01/08/2024 EXAM: 2 VIEW(S) XRAY OF THE CHEST 01/08/2024 10:54:00 PM COMPARISON: 10/18/2023 CLINICAL HISTORY: cp FINDINGS: LINES, TUBES AND DEVICES: Tunneled right IJ hemodialysis catheter in place to distal SVC, unchanged. LUNGS AND PLEURA: Stable elevated right hemidiaphragm. No focal pulmonary opacity. No pleural effusion. No pneumothorax. HEART AND MEDIASTINUM: No acute abnormality of the cardiac and mediastinal silhouettes. BONES AND SOFT TISSUES: Vertebral endplate spurring at multiple levels in mid and lower thoracic spine. No acute osseous abnormality. IMPRESSION: 1. No acute cardiopulmonary abnormality. Electronically signed by: Norman Gatlin MD 01/08/2024 10:57 PM EST RP Workstation: [id number]   Dialysis Orders:  TTS - [facility] 4[facility], 400/600, EDW 93kg, 3K/2.5Ca bath, TDC, no heparin  - Mircera 100mcg IV q 2 weeks - last 12/9 - Calcitriol  0.46mcg PO q HD  Assessment/Plan:  Chest pain: Presumed demand ischemia  Dyspnea: With edema and orthopnea - UF as tolerated.  ESRD:  Has missed some  HD recently - for HD today, then back to usual schedule with the next HD on Saturday 12/28.  Hypertension/volume: BP high, edema on exam.  Anemia: Hgb > 11, due for ESA but will hold off for now  Metabolic bone disease: Ca high, hold VDRA.  T2DM  COPD  Mood disorder  Izetta Boehringer, PA-C 01/09/2024, 10:14 AM  [facility]     [1]  Allergies Allergen Reactions   Dorethia Minium ] Other (See Comments)    GI bleed   Deltasone  [Prednisone ] Anaphylaxis and Swelling   Latuda [Lurasidone Hcl] Anaphylaxis   Lortab [Hydrocodone -Acetaminophen ] Hives and Shortness Of Breath   Magnesium -Containing Compounds Anaphylaxis and Other (See Comments)    Tolerated Ensure   Ultram  [Tramadol ] Anaphylaxis,  Swelling and Rash   Codeine Nausea And Vomiting and Rash   Desyrel  [Trazodone ] Other (See Comments)    Paranoia    Peanut (Diagnostic) Hives and Other (See Comments)    Raw peanuts only  OK to eat peanut butter.   Apresoline  [Hydralazine ] Other (See Comments)    Muscle spasms - tolerating currently   Sulfa Antibiotics Itching   Tape Rash   Topamax  [Topiramate ] Other (See Comments)    Paranoia

## 2024-01-09 NOTE — Progress Notes (Signed)
 Pt receives out-pt HD at [facility] on TTS 10:50 am chair time. Will assist as needed.   Randine Mungo Dialysis Navigator [phone]

## 2024-01-09 NOTE — Plan of Care (Signed)

## 2024-01-09 NOTE — Assessment & Plan Note (Addendum)
-   Consult nephrology for HD, appreciate recs - Lower extremity edema, will dose home torsemide  (unable to obtain IV access)

## 2024-01-09 NOTE — Assessment & Plan Note (Signed)
 Migraine headache, given ESRD Limited with options.  Additionally with CAD, advised against triptan use.  No IV access at this time.   -Trial IM Compazine  10 mg, and 25 mg Benadryl -can repeat as needed.

## 2024-01-09 NOTE — Plan of Care (Signed)
  Problem: Education: Goal: Knowledge of General Education information will improve Description: Including pain rating scale, medication(s)/side effects and non-pharmacologic comfort measures Outcome: Progressing   Problem: Clinical Measurements: Goal: Ability to maintain clinical measurements within normal limits will improve Outcome: Progressing Goal: Will remain free from infection Outcome: Progressing   Problem: Pain Managment: Goal: General experience of comfort will improve and/or be controlled Outcome: Progressing   Problem: Safety: Goal: Ability to remain free from injury will improve Outcome: Progressing

## 2024-01-09 NOTE — Progress Notes (Signed)
" °   01/09/24 1602  Vitals  Temp 97.9 F (36.6 C)  BP 95/82  Pulse Rate 87  Resp (!) 26  Oxygen  Therapy  SpO2 95 %  O2 Device Room Air  Post Treatment  Dialyzer Clearance Clear  Liters Processed 64.9  Fluid Removed (mL) 3000 mL  Tolerated HD Treatment Yes    "

## 2024-01-09 NOTE — Assessment & Plan Note (Addendum)
 Mood Disorder: Lexapro  10 mg daily, Cymbalta  30 mg daily, Lamictal  100 mg daily Schizoaffective Disorder: Reports she is not taking Risperdal , did not order at this time PTSD: Reports prazosin  does not help her, reports she does not want to take this medication HTN: Amlodipine  5 mg twice daily GERD: Pantoprazole  Diastolic heart failure: Continue 12.5 mg carvedilol  twice daily HLD: Lipitor  20 mg daily, Zetia  10 mg daily COPD: Restart Trelegy equivalent (Breztri )

## 2024-01-09 NOTE — ED Notes (Signed)
 Pt refuses blood work at this time.  Multiple staff members have stuck her without success.

## 2024-01-09 NOTE — Progress Notes (Signed)
 Patient has had 3 loose watery stools while on treatment. Stated that she has had trouble IBS

## 2024-01-09 NOTE — ED Notes (Signed)
 Dialysis contacted this RN to get report for patient. ETA for patient abou 1-1 1/2 hours.

## 2024-01-10 DIAGNOSIS — R0789 Other chest pain: Secondary | ICD-10-CM

## 2024-01-10 DIAGNOSIS — F250 Schizoaffective disorder, bipolar type: Secondary | ICD-10-CM | POA: Diagnosis not present

## 2024-01-10 DIAGNOSIS — N186 End stage renal disease: Secondary | ICD-10-CM

## 2024-01-10 DIAGNOSIS — Z992 Dependence on renal dialysis: Secondary | ICD-10-CM

## 2024-01-10 LAB — RENAL FUNCTION PANEL
Albumin: 4 g/dL (ref 3.5–5.0)
Anion gap: 18 — ABNORMAL HIGH (ref 5–15)
BUN: 43 mg/dL — ABNORMAL HIGH (ref 8–23)
CO2: 23 mmol/L (ref 22–32)
Calcium: 9.7 mg/dL (ref 8.9–10.3)
Chloride: 93 mmol/L — ABNORMAL LOW (ref 98–111)
Creatinine, Ser: 5.94 mg/dL — ABNORMAL HIGH (ref 0.44–1.00)
GFR, Estimated: 7 mL/min — ABNORMAL LOW
Glucose, Bld: 82 mg/dL (ref 70–99)
Phosphorus: 4.8 mg/dL — ABNORMAL HIGH (ref 2.5–4.6)
Potassium: 3.2 mmol/L — ABNORMAL LOW (ref 3.5–5.1)
Sodium: 133 mmol/L — ABNORMAL LOW (ref 135–145)

## 2024-01-10 LAB — MAGNESIUM: Magnesium: 1.9 mg/dL (ref 1.7–2.4)

## 2024-01-10 MED ORDER — LOPERAMIDE HCL 2 MG PO CAPS
4.0000 mg | ORAL_CAPSULE | ORAL | Status: DC | PRN
  Administered 2024-01-10 – 2024-01-13 (×4): 4 mg via ORAL
  Filled 2024-01-10 (×4): qty 2

## 2024-01-10 MED ORDER — DICYCLOMINE HCL 10 MG PO CAPS
10.0000 mg | ORAL_CAPSULE | Freq: Three times a day (TID) | ORAL | Status: DC | PRN
  Administered 2024-01-10 – 2024-01-13 (×5): 10 mg via ORAL
  Filled 2024-01-10 (×7): qty 1

## 2024-01-10 MED ORDER — BUSPIRONE HCL 5 MG PO TABS
10.0000 mg | ORAL_TABLET | Freq: Three times a day (TID) | ORAL | Status: DC
  Administered 2024-01-10 – 2024-01-11 (×4): 10 mg via ORAL
  Filled 2024-01-10 (×5): qty 2

## 2024-01-10 MED ORDER — VALBENAZINE TOSYLATE 40 MG PO CAPS
40.0000 mg | ORAL_CAPSULE | Freq: Every day | ORAL | Status: DC
  Administered 2024-01-10 – 2024-01-14 (×5): 40 mg via ORAL
  Filled 2024-01-10 (×4): qty 1

## 2024-01-10 MED ORDER — POTASSIUM CHLORIDE CRYS ER 20 MEQ PO TBCR: 40.0000 meq | EXTENDED_RELEASE_TABLET | Freq: Once | ORAL | Status: DC

## 2024-01-10 NOTE — Assessment & Plan Note (Signed)
-   Consult nephrology for HD, appreciate recs - s/p HD 12/24, to continue home schedule
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
## 2024-01-10 NOTE — Assessment & Plan Note (Signed)
 Improved after HD. Not anginal in nature. -Appreciate cardiology, signed off.  Pain likely secondary to missed HD - Echocardiogram without significant change in EF or regional wall motion abnormalities.  Outpatient f/u
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
## 2024-01-10 NOTE — Assessment & Plan Note (Signed)
 Mood Disorder: Lexapro  10 mg daily, Cymbalta  30 mg daily, Lamictal  100 mg daily Schizoaffective Disorder: Reports she is not taking Risperdal , did not order at this time PTSD: Reports prazosin  does not help her, reports she does not want to take this medication HTN: Amlodipine  5 mg twice daily GERD: Pantoprazole  Diastolic heart failure: Continue 12.5 mg carvedilol  twice daily HLD: Lipitor  20 mg daily, Zetia  10 mg daily COPD: Restart Trelegy equivalent (Breztri )

## 2024-01-11 DIAGNOSIS — F250 Schizoaffective disorder, bipolar type: Secondary | ICD-10-CM

## 2024-01-11 DIAGNOSIS — N186 End stage renal disease: Secondary | ICD-10-CM | POA: Diagnosis not present

## 2024-01-11 DIAGNOSIS — F4310 Post-traumatic stress disorder, unspecified: Secondary | ICD-10-CM | POA: Diagnosis not present

## 2024-01-11 DIAGNOSIS — Z992 Dependence on renal dialysis: Secondary | ICD-10-CM | POA: Diagnosis not present

## 2024-01-11 DIAGNOSIS — F1420 Cocaine dependence, uncomplicated: Secondary | ICD-10-CM | POA: Diagnosis not present

## 2024-01-11 MED ORDER — OLANZAPINE 5 MG PO TABS
5.0000 mg | ORAL_TABLET | Freq: Every day | ORAL | Status: DC
  Administered 2024-01-11 – 2024-01-12 (×2): 5 mg via ORAL
  Filled 2024-01-11 (×4): qty 1
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
## 2024-01-11 NOTE — Assessment & Plan Note (Signed)
 Improved after HD. Not anginal in nature. -Appreciate cardiology, signed off.  Pain likely secondary to missed HD - Echocardiogram without significant change in EF or regional wall motion abnormalities.  Outpatient f/u

## 2024-01-11 NOTE — Evaluation (Signed)
 Occupational Therapy Evaluation & Discharge Patient Details Name: Tiffany [name] MRN: [id number] DOB: 1960/03/29 Today's Date: 01/11/2024   History of Present Illness   63 y.o. female presents to [facility]/11/24 with L sided chest pain in setting of missed dialysis. PMHx: ESRD, OSA, HTN, CHF, COPD, DM2, CKD, PTSD, gout, seizures, CVA, anoxic brain injury, peripheral neuropathy, schizoaffective disorder, MI x2, HLD, and drug abuse (cocaine).     Clinical Impressions Pt admitted based on above, and was seen based on problem list below. PTA pt was mod I with basic ADLs from power w/c level. Pt reports only standing for LB ADLs at sink. Today pt is at baseline for ADLs. Received pt in power w/c, completed bathing and dressing tasks at sink. Pt mod I for UB ADLs, but CGA for all standing ADLs, d/t baseline BLE weakness. Pt reports active with HHPT, but has graduated from [facility]. Pt reporting no OT related concerns at d/c, no follow up OT needs. Given pt at baseline, no further acute OT needs identified. OT is signing off on this pt.     If plan is discharge home, recommend the following:   A little help with walking and/or transfers;Assistance with cooking/housework;Assist for transportation     Functional Status Assessment   Patient has not had a recent decline in their functional status     Equipment Recommendations   None recommended by OT      Precautions/Restrictions   Precautions Precautions: Fall Recall of Precautions/Restrictions: Intact Restrictions Weight Bearing Restrictions Per Provider Order: No     Mobility Bed Mobility       General bed mobility comments: Received in power w/c    Transfers Overall transfer level: Needs assistance Equipment used: None Transfers: Sit to/from Stand Sit to Stand: Contact guard assist           General transfer comment: CGA for STS with use of sink to steady      Balance Overall balance assessment: Needs  assistance Sitting-balance support: No upper extremity supported, Feet supported Sitting balance-Leahy Scale: Fair     Standing balance support: Single extremity supported, During functional activity, Reliant on assistive device for balance Standing balance-Leahy Scale: Poor Standing balance comment: Reliant on external support     ADL either performed or assessed with clinical judgement   ADL Overall ADL's : At baseline     General ADL Comments: Pt completed UB bathing and dresing mod I. CGA for standing activities (LB bathing/dressing and toileting) d/t baseline BLE weakness     Vision Baseline Vision/History: 1 Wears glasses Vision Assessment?: No apparent visual deficits            Pertinent Vitals/Pain Pain Assessment Pain Assessment: Faces Faces Pain Scale: Hurts little more Pain Location: BLEs Pain Descriptors / Indicators: Discomfort, Grimacing Pain Intervention(s): Monitored during session     Extremity/Trunk Assessment Upper Extremity Assessment Upper Extremity Assessment: Overall WFL for tasks assessed   Lower Extremity Assessment Lower Extremity Assessment: Defer to PT evaluation RLE Deficits / Details: Hip flexion 3/5, Knee ext 4+/5, Ankle DF 2-/5 RLE Sensation: decreased light touch (progresses distally) LLE Deficits / Details: Hip flexion 3/5, Knee ext 5/5, Ankle DF 3/5 in limited ROM   Cervical / Trunk Assessment Cervical / Trunk Assessment: Other exceptions Cervical / Trunk Exceptions: increased body habitus   Communication Communication Communication: Impaired Factors Affecting Communication: Reduced clarity of speech   Cognition Arousal: Alert Behavior During Therapy: WFL for tasks assessed/performed Cognition: No apparent impairments  OT - Cognition Comments: tangiental but largely WFL       Following commands: Intact       Cueing  General Comments   Cueing Techniques: Verbal cues;Visual cues  VSS on RA           Home  Living Family/patient expects to be discharged to:: Private residence Living Arrangements: Other relatives Available Help at Discharge: Family;Available PRN/intermittently Type of Home: House Home Access: Ramped entrance     Home Layout: One level     Bathroom Shower/Tub: Sponge bathes at baseline   Bathroom Toilet: Standard Bathroom Accessibility: Yes   Home Equipment: BSC/3in1;Hospital bed;Wheelchair - Surveyor, Quantity (2 wheels)   Additional Comments: Pt reports plans to move to w/c accessible apartment      Prior Functioning/Environment Prior Level of Function : Needs assist             Mobility Comments: Mod I with Stand pivots to power w/c ADLs Comments: Mod I for ADLs, sponge bathes at sink d/t bathroom. Uses depends. Ass    OT Problem List: Decreased strength;Impaired balance (sitting and/or standing)        OT Goals(Current goals can be found in the care plan section)   Acute Rehab OT Goals Patient Stated Goal: To go home OT Goal Formulation: All assessment and education complete, DC therapy Time For Goal Achievement: 01/25/24 Potential to Achieve Goals: Good   AM-PAC OT 6 Clicks Daily Activity     Outcome Measure Help from another person eating meals?: None Help from another person taking care of personal grooming?: None Help from another person toileting, which includes using toliet, bedpan, or urinal?: A Little Help from another person bathing (including washing, rinsing, drying)?: A Little Help from another person to put on and taking off regular upper body clothing?: None Help from another person to put on and taking off regular lower body clothing?: A Little 6 Click Score: 21   End of Session Equipment Utilized During Treatment: Other (comment) (power w/c) Nurse Communication: Mobility status  Activity Tolerance: Patient tolerated treatment well Patient left: in chair;with call bell/phone within reach  OT Visit Diagnosis: Unsteadiness  on feet (R26.81);Other abnormalities of gait and mobility (R26.89);Muscle weakness (generalized) (M62.81)                Time: 8850-8783 OT Time Calculation (min): 27 min Charges:  OT General Charges $OT Visit: 1 Visit OT Evaluation $OT Eval Moderate Complexity: 1 Mod OT Treatments $Self Care/Home Management : 8-22 mins  Adrianne BROCKS, OT  Acute Rehabilitation Services Office [phone] Secure chat preferred   Adrianne GORMAN Savers 01/11/2024, 12:45 PM

## 2024-01-11 NOTE — Plan of Care (Signed)
   Problem: Activity: Goal: Risk for activity intolerance will decrease Outcome: Progressing   Problem: Coping: Goal: Level of anxiety will decrease Outcome: Progressing

## 2024-01-11 NOTE — Assessment & Plan Note (Signed)
-   Consult nephrology for HD, appreciate recs - s/p HD 12/24, to continue home schedule TTS

## 2024-01-11 NOTE — Progress Notes (Signed)
" °  [facility] Progress Note   Subjective: Seen in room. Comfortable and has no complaints this am. Denies chest pain, sob.   Objective Vitals:   01/10/24 2232 01/10/24 2337 01/11/24 0439 01/11/24 0755  BP:  129/86 131/82 117/80  Pulse:  77 80 81  Resp:  16 16 16   Temp:  98.3 F (36.8 C) 98.7 F (37.1 C) 98.5 F (36.9 C)  TempSrc:  Oral Oral Oral  SpO2: 97% 94% 94% 91%  Weight:      Height:          Additional Objective Labs: Basic Metabolic Panel: Recent Labs  Lab 01/08/24 2221 01/10/24 0453  NA 135 133*  K 3.9 3.2*  CL 95* 93*  CO2 23 23  GLUCOSE 81 82  BUN 77* 43*  CREATININE 8.55* 5.94*  CALCIUM  10.1 9.7  PHOS  --  4.8*   CBC: Recent Labs  Lab 01/08/24 2221  WBC 8.5  HGB 11.2*  HCT 36.1  MCV 90.9  PLT 283   Blood Culture    Component Value Date/Time   SDES BLOOD RIGHT HAND 07/31/2023 1402   SPECREQUEST  07/31/2023 1402    BOTTLES DRAWN AEROBIC ONLY Blood Culture results may not be optimal due to an inadequate volume of blood received in culture bottles   CULT  07/31/2023 1402    NO GROWTH 5 DAYS Performed at [facility], [address] [place], [place] [address]    REPTSTATUS 08/05/2023 FINAL 07/31/2023 1402     Physical Exam General: Alert, nad Heart: RRR Lungs: Clear, normal wob Abdomen: soft, non-tender Extremities: no sig LE edema  Dialysis Access: TDC  Medications:   amLODipine   5 mg Oral BID   atorvastatin   20 mg Oral Daily   budesonide -glycopyrrolate -formoterol   2 puff Inhalation BID   busPIRone   10 mg Oral TID   carvedilol   12.5 mg Oral BID WC   Chlorhexidine  Gluconate Cloth  6 each Topical Q0600   DULoxetine   30 mg Oral Daily   escitalopram   10 mg Oral Daily   ezetimibe   10 mg Oral Daily   heparin   5,000 Units Subcutaneous Q8H   lamoTRIgine   100 mg Oral Daily   pantoprazole   40 mg Oral Daily   torsemide   40 mg Oral BID   valbenazine   40 mg Oral Daily    Dialysis Orders:  TTS - [facility] 4hr,  400/600, EDW 93kg, 3K/2.5Ca bath, TDC, no heparin  - Mircera 100mcg IV q 2 weeks - last 12/9 - Calcitriol  0.61mcg PO q HD   Assessment/Plan:  Chest pain: Presumed demand ischemia 2/2 to missed HD.  Improved with dialysis.   Dyspnea: With edema and orthopnea - UF as tolerated.  ESRD:  Has missed some HD recently. Had HD on 12/24.  next HD on Saturday 12/28.  Hypertension/volume: BP improved. Volume status improving   Anemia: Hgb > 11, due for ESA but will hold off for now  Metabolic bone disease: Ca high, hold VDRA.  T2DM  COPD  Mood disorder    Tiffany Ronnald Acosta PA-C [facility] 01/11/2024,9:15 AM  "

## 2024-01-11 NOTE — Progress Notes (Signed)
 Physical Therapy Evaluation Patient Details Name: Tiffany [name] MRN: [id number] DOB: 02-Aug-1960 Today's Date: 01/11/2024  History of Present Illness  63 y.o. female presents to [facility]/11/24 with L sided chest pain in setting of missed dialysis. PMHx: ESRD, OSA, HTN, CHF, COPD, DM2, CKD, PTSD, gout, seizures, CVA, anoxic brain injury, peripheral neuropathy, schizoaffective disorder, MI x2, HLD, and drug abuse (cocaine).   Clinical Impression  PTA pt was ModI to perform squat or stand pivots to power WC. Pt presents with R>L LE weakness, impaired balance and impaired mobility. Pt presents close to baseline being ModI for bed mobility and CGA to perform a low squat-pivot to the power WC. No DME utilized with pt preferring to reach for [facility] armrest.  Pt was receiving HHPT and would like to continue upon d/c. TOC involved regarding safe disposition plan. Will continue to follow acutely to address functional limitations.          If plan is discharge home, recommend the following: A little help with walking and/or transfers;A little help with bathing/dressing/bathroom;Assist for transportation;Help with stairs or ramp for entrance;Assistance with cooking/housework   Can travel by private vehicle    In W/C accessible vehicle    Equipment Recommendations None recommended by PT     Functional Status Assessment Patient has had a recent decline in their functional status and demonstrates the ability to make significant improvements in function in a reasonable and predictable amount of time.     Precautions / Restrictions Precautions Precautions: Fall Recall of Precautions/Restrictions: Intact Restrictions Weight Bearing Restrictions Per Provider Order: No      Mobility  Bed Mobility Overal bed mobility: Modified Independent    General bed mobility comments: moved into long-sitting and then pivoted hips towards EOB    Transfers Overall transfer level: Needs assistance Equipment used:  None Transfers: Bed to chair/wheelchair/BSC    Squat pivot transfers: Contact guard assist    General transfer comment: Low squat pivot towards the right with pt reaching for WC arm rest. Increased difficulty keeping R foot from sliding anteriorly. CGA for safety with no physical assist needed    Ambulation/Gait   General Gait Details: unable at baseline    Balance Overall balance assessment: Needs assistance Sitting-balance support: No upper extremity supported, Feet supported Sitting balance-Leahy Scale: Fair        Pertinent Vitals/Pain Pain Assessment Pain Assessment: No/denies pain    Home Living Family/patient expects to be discharged to:: Private residence Living Arrangements: Other relatives Available Help at Discharge: Family;Available PRN/intermittently Type of Home: House Home Access: Ramped entrance    Home Layout: One level Home Equipment: BSC/3in1;Hospital bed;Wheelchair - [employer]or, [employer] (2 wheels) Additional Comments: Thinking about getting an aide for cooking, bathing    Prior Function Prior Level of Function : Needs assist    Mobility Comments: Stand-pivots to electric W/C. Transportation to/from HD ADLs Comments: Sponge baths at kitchen sink because she can't get her w/c in the bathroom. reports wearing depends and standing at sink to change them, reports ordering food in. Able to don/dof socks/clothes as long as she is below a certain weight     Extremity/Trunk Assessment   Upper Extremity Assessment Upper Extremity Assessment: Defer to OT evaluation    Lower Extremity Assessment Lower Extremity Assessment: RLE deficits/detail;LLE deficits/detail RLE Deficits / Details: Hip flexion 3/5, Knee ext 4+/5, Ankle DF 2-/5 RLE Sensation: decreased light touch (progresses distally) LLE Deficits / Details: Hip flexion 3/5, Knee ext 5/5, Ankle DF 3/5 in limited ROM  Cervical / Trunk Assessment Cervical / Trunk Assessment: Other  exceptions Cervical / Trunk Exceptions: increased body habitus  Communication   Communication Communication: Impaired Factors Affecting Communication: Reduced clarity of speech    Cognition Arousal: Alert Behavior During Therapy: WFL for tasks assessed/performed   PT - Cognitive impairments: No apparent impairments    Following commands: Intact       Cueing Cueing Techniques: Verbal cues, Visual cues     General Comments General comments (skin integrity, edema, etc.): VSS on RA     PT Assessment Patient needs continued PT services  PT Problem List Decreased strength;Decreased range of motion;Decreased activity tolerance;Decreased balance;Decreased mobility       PT Treatment Interventions DME instruction;Functional mobility training;Therapeutic activities;Therapeutic exercise;Balance training;Neuromuscular re-education;Patient/family education;Wheelchair mobility training    PT Goals (Current goals can be found in the Care Plan section)  Acute Rehab PT Goals Patient Stated Goal: to work with PT while in the hospital PT Goal Formulation: With patient Time For Goal Achievement: 10/08/23 Potential to Achieve Goals: Good    Frequency Min 1X/week        AM-PAC PT 6 Clicks Mobility  Outcome Measure Help needed turning from your back to your side while in a flat bed without using bedrails?: None Help needed moving from lying on your back to sitting on the side of a flat bed without using bedrails?: None Help needed moving to and from a bed to a chair (including a wheelchair)?: A Little Help needed standing up from a chair using your arms (e.g., wheelchair or bedside chair)?: A Lot Help needed to walk in hospital room?: Total Help needed climbing 3-5 steps with a railing? : Total 6 Click Score: 15    End of Session Equipment Utilized During Treatment: Gait belt Activity Tolerance: Patient tolerated treatment well Patient left: with call bell/phone within reach;with  nursing/sitter in room;Other (comment) (in power WC with foot plate elevated and seat belt locked) Nurse Communication: Mobility status;Other (comment) (in room at end of session) PT Visit Diagnosis: Other abnormalities of gait and mobility (R26.89);Unsteadiness on feet (R26.81);Muscle weakness (generalized) (M62.81)    Time: 9077-9056 PT Time Calculation (min) (ACUTE ONLY): 21 min   Charges:   PT Evaluation $PT Eval Low Complexity: 1 Low   PT General Charges $$ ACUTE PT VISIT: 1 Visit       Kate ORN, PT, DPT Secure Chat Preferred  Rehab Office [phone]   Taisia Fantini Wendolyn 01/11/2024, 10:08 AM

## 2024-01-11 NOTE — Assessment & Plan Note (Signed)
 Mood Disorder: Lexapro  10 mg daily, Cymbalta  30 mg daily, Lamictal  100 mg daily Schizoaffective Disorder: Reports she is not taking Risperdal , did not order at this time PTSD: Reports prazosin  does not help her, reports she does not want to take this medication HTN: Amlodipine  5 mg twice daily GERD: Pantoprazole  Diastolic heart failure: Continue 12.5 mg carvedilol  twice daily HLD: Lipitor  20 mg daily, Zetia  10 mg daily COPD: Restart Trelegy equivalent (Breztri )
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
## 2024-01-12 DIAGNOSIS — F4310 Post-traumatic stress disorder, unspecified: Secondary | ICD-10-CM | POA: Diagnosis not present

## 2024-01-12 DIAGNOSIS — F1420 Cocaine dependence, uncomplicated: Secondary | ICD-10-CM

## 2024-01-12 DIAGNOSIS — F250 Schizoaffective disorder, bipolar type: Secondary | ICD-10-CM | POA: Diagnosis not present

## 2024-01-12 DIAGNOSIS — R0789 Other chest pain: Secondary | ICD-10-CM | POA: Diagnosis not present

## 2024-01-12 LAB — RENAL FUNCTION PANEL
Albumin: 3.8 g/dL (ref 3.5–5.0)
Anion gap: 17 — ABNORMAL HIGH (ref 5–15)
BUN: 72 mg/dL — ABNORMAL HIGH (ref 8–23)
CO2: 21 mmol/L — ABNORMAL LOW (ref 22–32)
Calcium: 9.7 mg/dL (ref 8.9–10.3)
Chloride: 91 mmol/L — ABNORMAL LOW (ref 98–111)
Creatinine, Ser: 8.9 mg/dL — ABNORMAL HIGH (ref 0.44–1.00)
GFR, Estimated: 5 mL/min — ABNORMAL LOW
Glucose, Bld: 91 mg/dL (ref 70–99)
Phosphorus: 6.7 mg/dL — ABNORMAL HIGH (ref 2.5–4.6)
Potassium: 3.7 mmol/L (ref 3.5–5.1)
Sodium: 129 mmol/L — ABNORMAL LOW (ref 135–145)

## 2024-01-12 LAB — CBC
HCT: 35.2 % — ABNORMAL LOW (ref 36.0–46.0)
Hemoglobin: 11.1 g/dL — ABNORMAL LOW (ref 12.0–15.0)
MCH: 27.9 pg (ref 26.0–34.0)
MCHC: 31.5 g/dL (ref 30.0–36.0)
MCV: 88.4 fL (ref 80.0–100.0)
Platelets: 254 K/uL (ref 150–400)
RBC: 3.98 MIL/uL (ref 3.87–5.11)
RDW: 15.5 % (ref 11.5–15.5)
WBC: 7.5 K/uL (ref 4.0–10.5)
nRBC: 0 % (ref 0.0–0.2)

## 2024-01-12 LAB — HEPATITIS B SURFACE ANTIBODY, QUANTITATIVE: Hep B S AB Quant (Post): 3140 m[IU]/mL

## 2024-01-12 MED ORDER — HEPARIN SODIUM (PORCINE) 1000 UNIT/ML DIALYSIS: 1000.0000 [IU] | INTRAMUSCULAR | Status: DC | PRN

## 2024-01-12 MED ORDER — ALTEPLASE 2 MG IJ SOLR: 2.0000 mg | Freq: Once | INTRAMUSCULAR | Status: DC | PRN

## 2024-01-12 MED ORDER — HEPARIN SODIUM (PORCINE) 1000 UNIT/ML IJ SOLN
INTRAMUSCULAR | Status: AC
  Filled 2024-01-12: qty 4

## 2024-01-12 MED ORDER — SIMETHICONE 80 MG PO CHEW
80.0000 mg | CHEWABLE_TABLET | Freq: Once | ORAL | Status: AC
  Administered 2024-01-12: 80 mg via ORAL
  Filled 2024-01-12: qty 1

## 2024-01-12 MED ORDER — BUSPIRONE HCL 10 MG PO TABS
10.0000 mg | ORAL_TABLET | Freq: Three times a day (TID) | ORAL | Status: DC
  Administered 2024-01-12 – 2024-01-14 (×6): 10 mg via ORAL
  Filled 2024-01-12 (×2): qty 1
  Filled 2024-01-12 (×2): qty 2
  Filled 2024-01-12 (×2): qty 1
  Filled 2024-01-12 (×2): qty 2
  Filled 2024-01-12 (×4): qty 1
  Filled 2024-01-12 (×2): qty 2

## 2024-01-12 NOTE — Assessment & Plan Note (Signed)
 Mood Disorder: Lexapro  10 mg daily, Cymbalta  30 mg daily, Lamictal  100 mg daily Schizoaffective Disorder: Reports she is not taking Risperdal , did not order at this time PTSD: Reports prazosin  does not help her, reports she does not want to take this medication HTN: Amlodipine  5 mg twice daily GERD: Pantoprazole  Diastolic heart failure: Continue 12.5 mg carvedilol  twice daily HLD: Lipitor  20 mg daily, Zetia  10 mg daily COPD: Restart Trelegy equivalent (Breztri )

## 2024-01-12 NOTE — Assessment & Plan Note (Addendum)
 Improved after HD. Not anginal in nature. LUQ pain endorsed today. Relief with pressure over L chest and LUQ. Patient believes may be gas pain.  - Appreciate cardiology, signed off.  Pain likely secondary to missed HD - Echocardiogram without significant change in EF or regional wall motion abnormalities.  Outpatient f/u - Ordered Simethicone  80 mg once

## 2024-01-12 NOTE — Procedures (Signed)
 Patient was seen on dialysis and the procedure was supervised.  BFR 400  Via TDC BP is  128/54.   Patient appears to be tolerating treatment well  Cameren Odwyer Amelie Romney 01/12/2024

## 2024-01-12 NOTE — Assessment & Plan Note (Signed)
-   Consult nephrology for HD, appreciate recs - s/p HD 12/24, to continue home schedule TTS

## 2024-01-12 NOTE — Plan of Care (Signed)
   Problem: Education: Goal: Knowledge of General Education information will improve Description Including pain rating scale, medication(s)/side effects and non-pharmacologic comfort measures Outcome: Progressing
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
## 2024-01-12 NOTE — Procedures (Signed)
 HD Note:  Some information was entered later than the data was gathered due to patient care needs. The stated time with the data is accurate.  Received patient in bed to unit.   Alert and oriented to self and situation. Patient complained of a cramping sensation in her left upper abdomen.  Mds notified, see MAR  Informed consent signed and in chart.   Access used: Upper right chest HD catheter Access issues: Catheter was positional.  Lines were reversed to accommodate arterial pressure.  Patient became diaphoretic and UF was paused.  Patient continued not to feel well and did not want any more fluid pulled. Patient ended treatment 25 min early.  When told she would need to sign the dialysis AMA form, she agreed.  Once treatment was stopped, she refused to sign and began stating I know you don't care what happens to me and  you are all alike  This tone had not been noted prior to this time.   TX duration: 3 hours and 5 min  Alert, without acute distress.  Total UF removed: 900 ml  Hand-off given to patient's nurse.   Transported back to the room   Michae Grimley L. Lenon, RN [facility] D[facility] Unit.
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
## 2024-01-13 DIAGNOSIS — R0789 Other chest pain: Secondary | ICD-10-CM | POA: Diagnosis not present

## 2024-01-13 MED ORDER — HYDROXYZINE HCL 10 MG PO TABS
10.0000 mg | ORAL_TABLET | Freq: Three times a day (TID) | ORAL | Status: DC
  Administered 2024-01-13 – 2024-01-14 (×4): 10 mg via ORAL
  Filled 2024-01-13 (×3): qty 1

## 2024-01-13 MED ORDER — GABAPENTIN 100 MG PO CAPS
100.0000 mg | ORAL_CAPSULE | Freq: Every day | ORAL | Status: DC
  Administered 2024-01-13: 100 mg via ORAL
  Filled 2024-01-13: qty 1

## 2024-01-13 MED ORDER — OLANZAPINE 5 MG PO TBDP
7.5000 mg | ORAL_TABLET | Freq: Every day | ORAL | Status: DC
  Administered 2024-01-13: 7.5 mg via ORAL
  Filled 2024-01-13: qty 1.5
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
## 2024-01-13 NOTE — Assessment & Plan Note (Addendum)
 Improved with HD.  - Appreciate cardiology, signed off.  Pain likely secondary to missed HD - Echocardiogram without significant change in EF or regional wall motion abnormalities.  Outpatient f/u
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
## 2024-01-13 NOTE — Assessment & Plan Note (Signed)
 Mood Disorder: Lexapro  10 mg daily, Cymbalta  30 mg daily, Lamictal  100 mg daily Schizoaffective Disorder: Reports she is not taking Risperdal , did not order at this time PTSD: Reports prazosin  does not help her, reports she does not want to take this medication HTN: Amlodipine  5 mg twice daily GERD: Pantoprazole  Diastolic heart failure: Continue 12.5 mg carvedilol  twice daily HLD: Lipitor  20 mg daily, Zetia  10 mg daily COPD: Restart Trelegy equivalent (Breztri )

## 2024-01-13 NOTE — Plan of Care (Signed)
" °  Problem: Clinical Measurements: Goal: Will remain free from infection Outcome: Progressing   Problem: Activity: Goal: Risk for activity intolerance will decrease Outcome: Progressing   Problem: Nutrition: Goal: Adequate nutrition will be maintained Outcome: Progressing   Problem: Safety: Goal: Ability to remain free from injury will improve Outcome: Progressing   Problem: Education: Goal: Knowledge of General Education information will improve Description: Including pain rating scale, medication(s)/side effects and non-pharmacologic comfort measures Outcome: Not Progressing   Problem: Health Behavior/Discharge Planning: Goal: Ability to manage health-related needs will improve Outcome: Not Progressing   Problem: Clinical Measurements: Goal: Ability to maintain clinical measurements within normal limits will improve Outcome: Not Progressing   Problem: Coping: Goal: Level of anxiety will decrease Outcome: Not Progressing   Problem: Pain Managment: Goal: General experience of comfort will improve and/or be controlled Outcome: Not Progressing   Problem: Skin Integrity: Goal: Risk for impaired skin integrity will decrease Outcome: Not Progressing   "
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
## 2024-01-13 NOTE — Plan of Care (Signed)

## 2024-01-13 NOTE — Assessment & Plan Note (Addendum)
-   Consult nephrology for HD, appreciate recs - Continue home schedule TTS

## 2024-01-13 NOTE — Progress Notes (Signed)
" °  [facility] Progress Note   Subjective: Seen in room. Dialysis yesterday - signed off early.  Voices no complaints to me but not really interested in conversation.   Objective Vitals:   01/12/24 2012 01/13/24 0023 01/13/24 0432 01/13/24 0745  BP:  (!) 124/92 (!) 155/91 (!) 144/95  Pulse:  75 82 87  Resp:  18 16 15   Temp:  98.2 F (36.8 C) 98.2 F (36.8 C) 98.3 F (36.8 C)  TempSrc:  Oral Oral Oral  SpO2: 93% 96% 96% 96%  Weight:      Height:         Additional Objective Labs: Basic Metabolic Panel: Recent Labs  Lab 01/08/24 2221 01/10/24 0453 01/12/24 0721  NA 135 133* 129*  K 3.9 3.2* 3.7  CL 95* 93* 91*  CO2 23 23 21*  GLUCOSE 81 82 91  BUN 77* 43* 72*  CREATININE 8.55* 5.94* 8.90*  CALCIUM  10.1 9.7 9.7  PHOS  --  4.8* 6.7*   CBC: Recent Labs  Lab 01/08/24 2221 01/12/24 0721  WBC 8.5 7.5  HGB 11.2* 11.1*  HCT 36.1 35.2*  MCV 90.9 88.4  PLT 283 254   Blood Culture    Component Value Date/Time   SDES BLOOD RIGHT HAND 07/31/2023 1402   SPECREQUEST  07/31/2023 1402    BOTTLES DRAWN AEROBIC ONLY Blood Culture results may not be optimal due to an inadequate volume of blood received in culture bottles   CULT  07/31/2023 1402    NO GROWTH 5 DAYS Performed at [facility], [address] [place], [place] [address]    REPTSTATUS 08/05/2023 FINAL 07/31/2023 1402     Physical Exam General: Alert, nad Heart: RRR Lungs: Clear, normal wob Abdomen: soft, non-tender Extremities: no sig LE edema  Dialysis Access: TDC  Medications:   amLODipine   5 mg Oral BID   atorvastatin   20 mg Oral Daily   budesonide -glycopyrrolate -formoterol   2 puff Inhalation BID   busPIRone   10 mg Oral TID   carvedilol   12.5 mg Oral BID WC   Chlorhexidine  Gluconate Cloth  6 each Topical Q0600   DULoxetine   30 mg Oral Daily   escitalopram   10 mg Oral Daily   ezetimibe   10 mg Oral Daily   heparin   5,000 Units Subcutaneous Q8H   lamoTRIgine   100 mg Oral  Daily   OLANZapine   5 mg Oral QHS   pantoprazole   40 mg Oral Daily   torsemide   40 mg Oral BID   valbenazine   40 mg Oral Daily    Dialysis Orders:  TTS - East 4hr, 400/600, EDW 93kg, 3K/2.5Ca bath, TDC, no heparin  - Mircera 100mcg IV q 2 weeks - last 12/9 - Calcitriol  0.75mcg PO q HD   Assessment/Plan:  Chest pain: Presumed demand ischemia 2/2 to missed HD.  Improved with dialysis.   Dyspnea: With edema and orthopnea - UF as tolerated.  ESRD:  HD TTS. Has missed some HD recently. Had HD on 12/24, 12/27. Next HD Tues   Hypertension/volume: BP improved. Volume status improving   Anemia: Hgb > 11, due for ESA but will hold off for now  Metabolic bone disease: Ca high, hold VDRA. Follow phos trend, no binder on home med list.   T2DM  COPD  Mood disorder    Harlon Kutner Ronnald Acosta PA-C  [facility] 01/13/2024,10:13 AM  "

## 2024-01-13 NOTE — Discharge Instructions (Signed)
 Dear Tiffany [name],   Thank you for letting us  participate in your care! You were admitted to the hospital for chest pain. Please follow up with the cardiologist as scheduled below.   POST-HOSPITAL & CARE INSTRUCTIONS We recommend following up with your PCP within 1 week from being discharged from the hospital. Please let PCP/Specialists know of any changes in medications that were made which you will be able to see in the medications section of this packet.  DOCTOR'S APPOINTMENTS & FOLLOW UP Future Appointments  Date Time Provider Department Center  02/05/2024  8:20 AM Okey Vina GAILS, MD [facility]   Thank you for choosing [facility]! Take care and be well!  Family Medicine Teaching Service Inpatient Team [place]  [facility]  [address] [place], [place] [address] [phone]
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
## 2024-01-14 ENCOUNTER — Other Ambulatory Visit (HOSPITAL_COMMUNITY): Payer: Self-pay

## 2024-01-14 DIAGNOSIS — F250 Schizoaffective disorder, bipolar type: Secondary | ICD-10-CM | POA: Diagnosis not present

## 2024-01-14 DIAGNOSIS — R0789 Other chest pain: Secondary | ICD-10-CM | POA: Diagnosis not present

## 2024-01-14 DIAGNOSIS — Z992 Dependence on renal dialysis: Secondary | ICD-10-CM | POA: Diagnosis not present

## 2024-01-14 DIAGNOSIS — N186 End stage renal disease: Secondary | ICD-10-CM | POA: Diagnosis not present

## 2024-01-14 LAB — CBC WITH DIFFERENTIAL/PLATELET
Abs Immature Granulocytes: 0.03 K/uL (ref 0.00–0.07)
Basophils Absolute: 0 K/uL (ref 0.0–0.1)
Basophils Relative: 0 %
Eosinophils Absolute: 0.1 K/uL (ref 0.0–0.5)
Eosinophils Relative: 2 %
HCT: 34 % — ABNORMAL LOW (ref 36.0–46.0)
Hemoglobin: 10.6 g/dL — ABNORMAL LOW (ref 12.0–15.0)
Immature Granulocytes: 0 %
Lymphocytes Relative: 22 %
Lymphs Abs: 1.6 K/uL (ref 0.7–4.0)
MCH: 28 pg (ref 26.0–34.0)
MCHC: 31.2 g/dL (ref 30.0–36.0)
MCV: 89.7 fL (ref 80.0–100.0)
Monocytes Absolute: 0.7 K/uL (ref 0.1–1.0)
Monocytes Relative: 10 %
Neutro Abs: 4.8 K/uL (ref 1.7–7.7)
Neutrophils Relative %: 66 %
Platelets: 260 K/uL (ref 150–400)
RBC: 3.79 MIL/uL — ABNORMAL LOW (ref 3.87–5.11)
RDW: 15.4 % (ref 11.5–15.5)
WBC: 7.2 K/uL (ref 4.0–10.5)
nRBC: 0 % (ref 0.0–0.2)

## 2024-01-14 LAB — RENAL FUNCTION PANEL
Albumin: 3.6 g/dL (ref 3.5–5.0)
Anion gap: 15 (ref 5–15)
BUN: 41 mg/dL — ABNORMAL HIGH (ref 8–23)
CO2: 21 mmol/L — ABNORMAL LOW (ref 22–32)
Calcium: 9.4 mg/dL (ref 8.9–10.3)
Chloride: 91 mmol/L — ABNORMAL LOW (ref 98–111)
Creatinine, Ser: 6.58 mg/dL — ABNORMAL HIGH (ref 0.44–1.00)
GFR, Estimated: 7 mL/min — ABNORMAL LOW
Glucose, Bld: 86 mg/dL (ref 70–99)
Phosphorus: 5.3 mg/dL — ABNORMAL HIGH (ref 2.5–4.6)
Potassium: 3.3 mmol/L — ABNORMAL LOW (ref 3.5–5.1)
Sodium: 127 mmol/L — ABNORMAL LOW (ref 135–145)

## 2024-01-14 MED ORDER — BUSPIRONE HCL 10 MG PO TABS
10.0000 mg | ORAL_TABLET | Freq: Three times a day (TID) | ORAL | 0 refills | Status: AC
  Filled 2024-01-14: qty 90, 30d supply, fill #0

## 2024-01-14 MED ORDER — OLANZAPINE 15 MG PO TABS
7.5000 mg | ORAL_TABLET | Freq: Every day | ORAL | 0 refills | Status: AC
  Filled 2024-01-14: qty 15, 30d supply, fill #0

## 2024-01-14 NOTE — Discharge Planning (Signed)
 [facility] Patient Discharge Orders - [facility] CLINIC: [place]  Patient's name: Tiffany [name] Admit/DC Dates: 01/08/2024 - 01/14/2024  DISCHARGE DIAGNOSES: Chest pain - demand ischemia  Dyspnea/volume overload  HD ORDER CHANGES: Heparin  change: no EDW Change: YES New EDW: 87.5kg Bath Change: no  ANEMIA MANAGEMENT: Aranesp : Given: no  ESA dose for discharge: mircera 50 mcg IV q 2 weeks, to start on 12/30 IV Iron  dose at discharge: Per protocol  Transfusion: Given: no  BONE/MINERAL MEDICATIONS: Hectorol/Calcitriol  change: YES - hold calcitriol  until repeat labs (hyperCa this admit) Sensipar/Parsabiv change: no  ACCESS INTERVENTION/CHANGE: no Details:   RECENT LABS: Recent Labs  Lab 01/14/24 0510  HGB 10.6*  NA 127*  K 3.3*  CALCIUM  9.4  PHOS 5.3*  ALBUMIN  3.6    IV ANTIBIOTICS: no Details:  OTHER ANTICOAGULATION: no Details:  OTHER/APPTS/LAB ORDERS: - Pls check Ca, Phos, Alb, PTH this week to decide on resuming VDRA - D/cing to [facility] shelter in [place]. - Claims she is not coming back to dialysis - unclear - knows next option is Wed 01/16/24  D/C Meds to be reconciled by nurse after every discharge.  Completed By: Izetta Boehringer, PA-C [facility] Pager [phone]   Reviewed by: MD:______ RN_______

## 2024-01-14 NOTE — Assessment & Plan Note (Signed)
-   Consult nephrology for HD, appreciate recs - Continue home schedule TTS

## 2024-01-14 NOTE — Progress Notes (Addendum)
 " [facility] Progress Note   Subjective:   Seen in room - up in wheelchair. Denies CP/dyspnea today. For HD tomorrow.  Objective Vitals:   01/13/24 2342 01/14/24 0534 01/14/24 0805 01/14/24 0939  BP: 120/72 138/80 (!) 130/90   Pulse: 71 77 81   Resp: 19 20 20    Temp: 98.1 F (36.7 C) 98 F (36.7 C) 98.2 F (36.8 C)   TempSrc: Oral Axillary Oral   SpO2: 97% 92% 97% 94%  Weight:      Height:       Physical Exam General: Well appearing, NAD. Room air Heart: RRR Lungs: CTAB, no rales Abdomen: soft Extremities: no LE edema Dialysis Access: [facility]  Additional Objective Labs: Basic Metabolic Panel: Recent Labs  Lab 01/10/24 0453 01/12/24 0721 01/14/24 0510  NA 133* 129* 127*  K 3.2* 3.7 3.3*  CL 93* 91* 91*  CO2 23 21* 21*  GLUCOSE 82 91 86  BUN 43* 72* 41*  CREATININE 5.94* 8.90* 6.58*  CALCIUM  9.7 9.7 9.4  PHOS 4.8* 6.7* 5.3*   Liver Function Tests: Recent Labs  Lab 01/10/24 0453 01/12/24 0721 01/14/24 0510  ALBUMIN  4.0 3.8 3.6   CBC: Recent Labs  Lab 01/08/24 2221 01/12/24 0721 01/14/24 0510  WBC 8.5 7.5 7.2  NEUTROABS  --   --  4.8  HGB 11.2* 11.1* 10.6*  HCT 36.1 35.2* 34.0*  MCV 90.9 88.4 89.7  PLT 283 254 260   Medications:   amLODipine   5 mg Oral BID   atorvastatin   20 mg Oral Daily   budesonide -glycopyrrolate -formoterol   2 puff Inhalation BID   busPIRone   10 mg Oral TID   carvedilol   12.5 mg Oral BID WC   Chlorhexidine  Gluconate Cloth  6 each Topical Q0600   DULoxetine   30 mg Oral Daily   escitalopram   10 mg Oral Daily   ezetimibe   10 mg Oral Daily   gabapentin   100 mg Oral QHS   hydrOXYzine   10 mg Oral TID   lamoTRIgine   100 mg Oral Daily   OLANZapine  zydis  7.5 mg Oral QHS   pantoprazole   40 mg Oral Daily   torsemide   40 mg Oral BID   valbenazine   40 mg Oral Daily    Dialysis Orders TTS - East 4hr, 400/600, EDW 93kg, 3K/2.5Ca bath, TDC, no heparin  - Mircera 100mcg IV q 2 weeks - last 12/9 - Calcitriol  0.48mcg  PO q HD   Assessment/Plan:  Chest pain: Presumed demand ischemia 2/2 to missed HD - improved.  Dyspnea/volume: With edema and orthopnea - UF as tolerated. Below prior EDW now. Still with hyponatremia reflecting fluid - continue to lower dry weight as tolerated.  ESRD: Continue HD on usual TTS schedule - for HD tomorrow.   Anemia: Hgb > 11, due for ESA but will hold off for now  Metabolic bone disease: Ca high, hold VDRA. Phos better today, not on binder as outpatient - trend.  T2DM  COPD  Mood disorder     Izetta Boehringer, DEVONNA 01/14/2024, 10:32 AM   [facility]   Seen and examined independently.  Agree with note and exam as documented above by physician extender and as noted here.  She states that she is frustrated with outpatient dialysis and is frustrated by coming here - she doesn't want to talk further with her family about this or with me as she states y'all already know. Discussed that this was my first day on service.    General adult female  in bed in no acute distress HEENT normocephalic atraumatic extraocular movements intact sclera anicteric Neck supple trachea midline Lungs clear to auscultation bilaterally normal work of breathing at rest on room air  Heart S1S2 no rub  Abdomen soft nontender obese habitus Extremities no pitting edema  Psych normal mood and affect Neuro awake and conversant; provides hx and follows commands Access RIJ tunn catheter   # ESRD - Usually TTS - her outpatient HD unit cannot accommodate her tomorrow and that unit is running a holiday schedule for New Year's.  So, instead of Tuesday, her next tx there would be Wed.  - we are trying to see if she can get an extra HD tx here today.  We have recommended and she has thus far declined this  - if here tomorrow, then HD here - I've reached out to HD SW to see if she can apply for another clinic for HD - though ultimately the clinic may not be the actual issue but rather compliance    # Anemia of CKD  - Hb acceptable     # Hyponatremia - optimize volume status with HD   Disposition - next HD here and then discharge per primary team   Katheryn JAYSON Saba, MD 01/14/2024  2:54 PM    "
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
## 2024-01-14 NOTE — Progress Notes (Signed)
 D/c orders noted. Contacted out-pt HD clinic, [facility], to inform of d/c and anticipated arrival. No further support needed.   Lavanda Lanetta Figuero Dialysis Navigator [phone]

## 2024-01-14 NOTE — Progress Notes (Signed)
 Noted that she has discharge order - apparently requesting to go to [facility] shelter in [place]. Went to clarify plan with patient.  Regarding dialysis -  Last dialyzed here on Saturday, plan was for inpatient HD tomorrow. Her outpatient clinic will be running Holiday schedule - so Sun, Wed, Fri this week. They cannot squeeze her in tomorrow. Informed Andreika and offered HD today. She says she is not going back to outpatient dialysis. I don't care about any of that.  We discussed risk of death without dialysis and she acknowledges that. Told her that I do not think this is a sound discharge plan, but she says I don't care, I know what I am going to do. Case manager, SW, nephrologist aware.  Izetta Boehringer, PA-C [employer] Pager [phone]

## 2024-01-14 NOTE — Assessment & Plan Note (Signed)
" ° ° ° °  Mood Disorder: Lexapro  10 mg daily, Cymbalta  30 mg daily, Lamictal  100 mg daily Schizoaffective Disorder: Reports she is not taking Risperdal , did not order at this time PTSD: Reports prazosin  does not help her, reports she does not want to take this medication HTN: Amlodipine  5 mg twice daily GERD: Pantoprazole  Diastolic heart failure: Continue 12.5 mg carvedilol  twice daily HLD: Lipitor  20 mg daily, Zetia  10 mg daily COPD: Restart Trelegy equivalent (Breztri ) "
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
## 2024-01-14 NOTE — Assessment & Plan Note (Signed)
" ° ° °  Improved with HD.  - Appreciate cardiology, signed off.  Pain likely secondary to missed HD - Echocardiogram without significant change in EF or regional wall motion abnormalities.  Outpatient f/u "

## 2024-01-14 NOTE — Progress Notes (Signed)
 Pt refused her heparin  shots. Doctor notified.

## 2024-01-14 NOTE — TOC Transition Note (Signed)
 Transition of Care [facility]) - Discharge Note   Patient Details  Name: Tiffany [name] MRN: [id number] Date of Birth: [birth year]  [facility]) CM/SW Contact:  Tom-Johnson, Harvest Muskrat, RN Phone Number: 01/14/2024, 2:39 PM   Clinical Narrative:     CM spoke with patient and LCSW at bedside about home health recommendations. Patient declined, states she will f/u with her PCP outpatient after she is settled in at [facility]. Transportation arranged by [employer].   No further ICM needs noted.        Patient Goals and CMS Choice            Discharge Placement                       Discharge Plan and Services Additional resources added to the After Visit Summary for                                       Social Drivers of Health (SDOH) Interventions SDOH Screenings   Food Insecurity: No Food Insecurity (01/11/2024)  Housing: High Risk (01/11/2024)  Transportation Needs: No Transportation Needs (01/11/2024)  Utilities: Not At Risk (01/11/2024)  Alcohol Screen: Low Risk (08/15/2023)  Depression (PHQ2-9): High Risk (01/04/2024)  Financial Resource Strain: Low Risk (07/11/2022)  Physical Activity: Inactive (07/11/2022)  Social Connections: Socially Isolated (06/28/2023)  Stress: Stress Concern Present (07/11/2022)  Tobacco Use: Medium Risk (01/09/2024)     Readmission Risk Interventions    06/29/2023    3:13 PM 06/05/2022    2:47 PM 02/07/2022    2:22 PM  Readmission Risk Prevention Plan  Transportation Screening Complete Complete Complete  Medication Review (RN Care Manager) Referral to Pharmacy Referral to Pharmacy Complete  PCP or Specialist appointment within 3-5 days of discharge Complete Complete Complete  HRI or Home Care Consult Complete Complete Complete  SW Recovery Care/Counseling Consult Complete Complete Complete  Palliative Care Screening Not Applicable Not Applicable Not Applicable  Skilled Nursing Facility Not Applicable Not Applicable  Not Applicable
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
## 2024-01-14 NOTE — Care Management Important Message (Signed)
 Important Message  Patient Details  Name: Tiffany [name] MRN: [id number] Date of Birth: 11-12-1960   Important Message Given:  No     [name] 01/14/2024, 3:49 PM

## 2024-01-14 NOTE — Plan of Care (Signed)
  Problem: Education: Goal: Knowledge of General Education information will improve Description: Including pain rating scale, medication(s)/side effects and non-pharmacologic comfort measures Outcome: Progressing   Problem: Health Behavior/Discharge Planning: Goal: Ability to manage health-related needs will improve Outcome: Progressing   Problem: Clinical Measurements: Goal: Ability to maintain clinical measurements within normal limits will improve Outcome: Progressing   Problem: Clinical Measurements: Goal: Will remain free from infection Outcome: Progressing   Problem: Clinical Measurements: Goal: Diagnostic test results will improve Outcome: Progressing   Problem: Safety: Goal: Ability to remain free from injury will improve Outcome: Progressing   Problem: Skin Integrity: Goal: Risk for impaired skin integrity will decrease Outcome: Progressing
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
## 2024-01-14 NOTE — Progress Notes (Signed)
 Patient discharged, Important Message Letter mailed to patient.

## 2024-01-17 DIAGNOSIS — R0602 Shortness of breath: Secondary | ICD-10-CM | POA: Diagnosis not present

## 2024-01-21 ENCOUNTER — Ambulatory Visit: Payer: Self-pay

## 2024-01-21 ENCOUNTER — Inpatient Hospital Stay: Payer: Self-pay

## 2024-02-03 NOTE — Progress Notes (Unsigned)
 Appt cancelled

## 2024-02-04 ENCOUNTER — Ambulatory Visit: Payer: Self-pay

## 2024-02-04 VITALS — BP 150/106 | HR 92 | Ht 63.0 in | Wt 211.4 lb

## 2024-02-04 DIAGNOSIS — F250 Schizoaffective disorder, bipolar type: Secondary | ICD-10-CM

## 2024-02-04 DIAGNOSIS — J42 Unspecified chronic bronchitis: Secondary | ICD-10-CM | POA: Diagnosis not present

## 2024-02-04 DIAGNOSIS — J209 Acute bronchitis, unspecified: Secondary | ICD-10-CM

## 2024-02-04 MED ORDER — TRELEGY ELLIPTA 100-62.5-25 MCG/ACT IN AEPB: 1.0000 | INHALATION_SPRAY | Freq: Every day | RESPIRATORY_TRACT | 3 refills | Status: AC

## 2024-02-04 NOTE — Progress Notes (Unsigned)
" ° ° °  SUBJECTIVE:   CHIEF COMPLAINT / HPI: Hospital f/u  Patient was admitted to [facility] from 01/08/2024 - 01/14/2024. Patient was then admitted to [facility] from 01/17/2024 - 01/25/2024.  She was admitted both times for SOB and chest pain 2/2 missed dialysis.   Since being out of the hospital she states she is feeling much better. She states she last went to dialysis on Saturday and her next session is tomorrow. She continues to state that this will not work long term. She very much dislikes dialysis. She also endorses displeasure with her experience at [facility].   She did however get set up with Psychiatry and is very excited about it. She really likes the provider she sees. She states she has paid for it out of pocket, but is requesting a referral today. She sees [facility] [employer]. Her provider is Kreg Imus, NP.   PERTINENT  PMH / PSH: ESRD on dialysis, OSA and COPD overlap syndrome, CAD, history of anoxic brain injury, morbid obesity, schizoaffective disorder (bipolar type), schizophrenia paranoid, severe spinal stenosis, lumbar   OBJECTIVE:   BP (!) 150/106   Pulse 92   Ht 5' 3 (1.6 m)   Wt 95.9 kg   SpO2 96%   BMI 37.45 kg/m   General: well-appearing Cardiovascular: RRR, no M/R/G Pulmonary: CTAB, normal work of breathing on room air  Abdomen: soft, non-distended, non-tender to palpation   ASSESSMENT/PLAN:   Assessment & Plan Schizoaffective disorder, bipolar type [facility]) Referral to psychiatry.  - Requested patient's current provider  Chronic bronchitis, unspecified chronic bronchitis type (HCC) Refilled Trelegy-ellipta      Tiffany KANDICE Lee, DO [facility] "

## 2024-02-04 NOTE — Patient Instructions (Addendum)
 I am glad you are feeling better after leaving the hospital.   You can contact your insurance company to find an in-network therapist.   I have sent in a referral for your psychiatrist. That may take 1-2 weeks to go through.

## 2024-02-05 ENCOUNTER — Ambulatory Visit: Admitting: Internal Medicine

## 2024-02-07 IMAGING — CR DG CHEST 2V
2 series · 2 of 2 positions shown · non-contrast
Comparison: 01/12/2021

CLINICAL DATA: Dyspnea, congestive heart failure

EXAM:
CHEST - 2 VIEW

[x chest ap (1 of 2)]
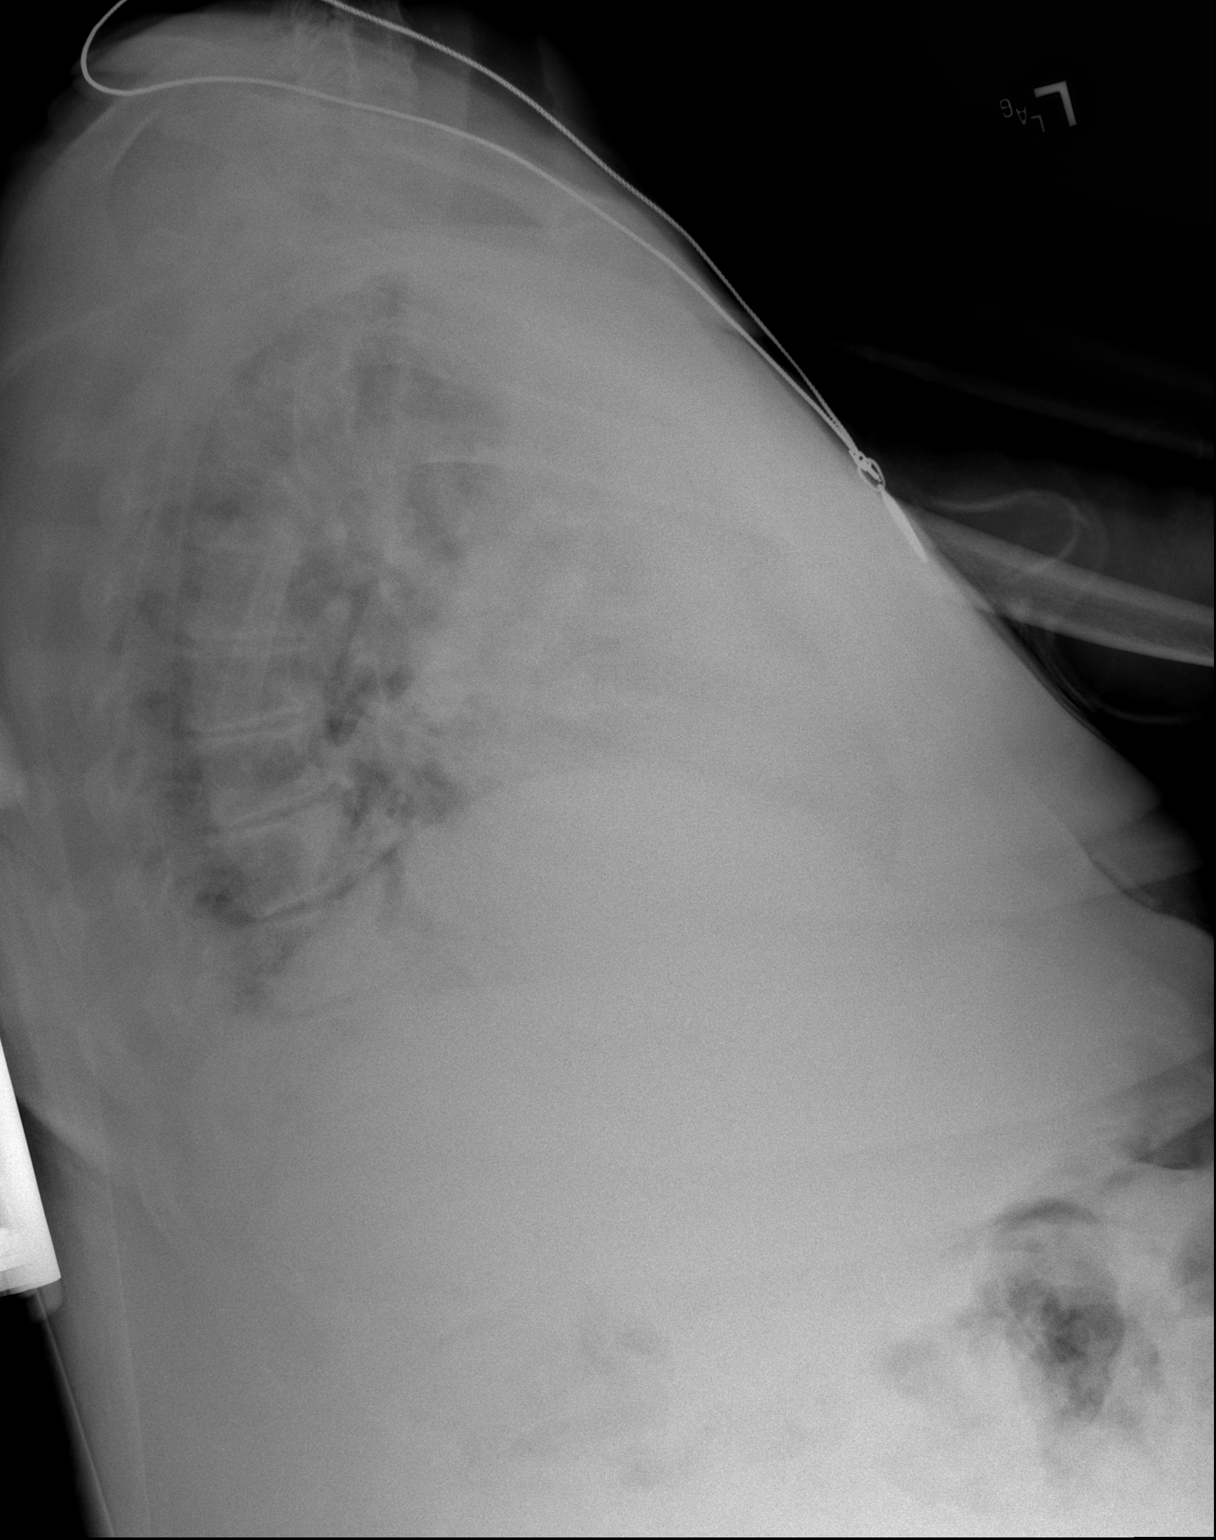

[x chest ap (2 of 2)]
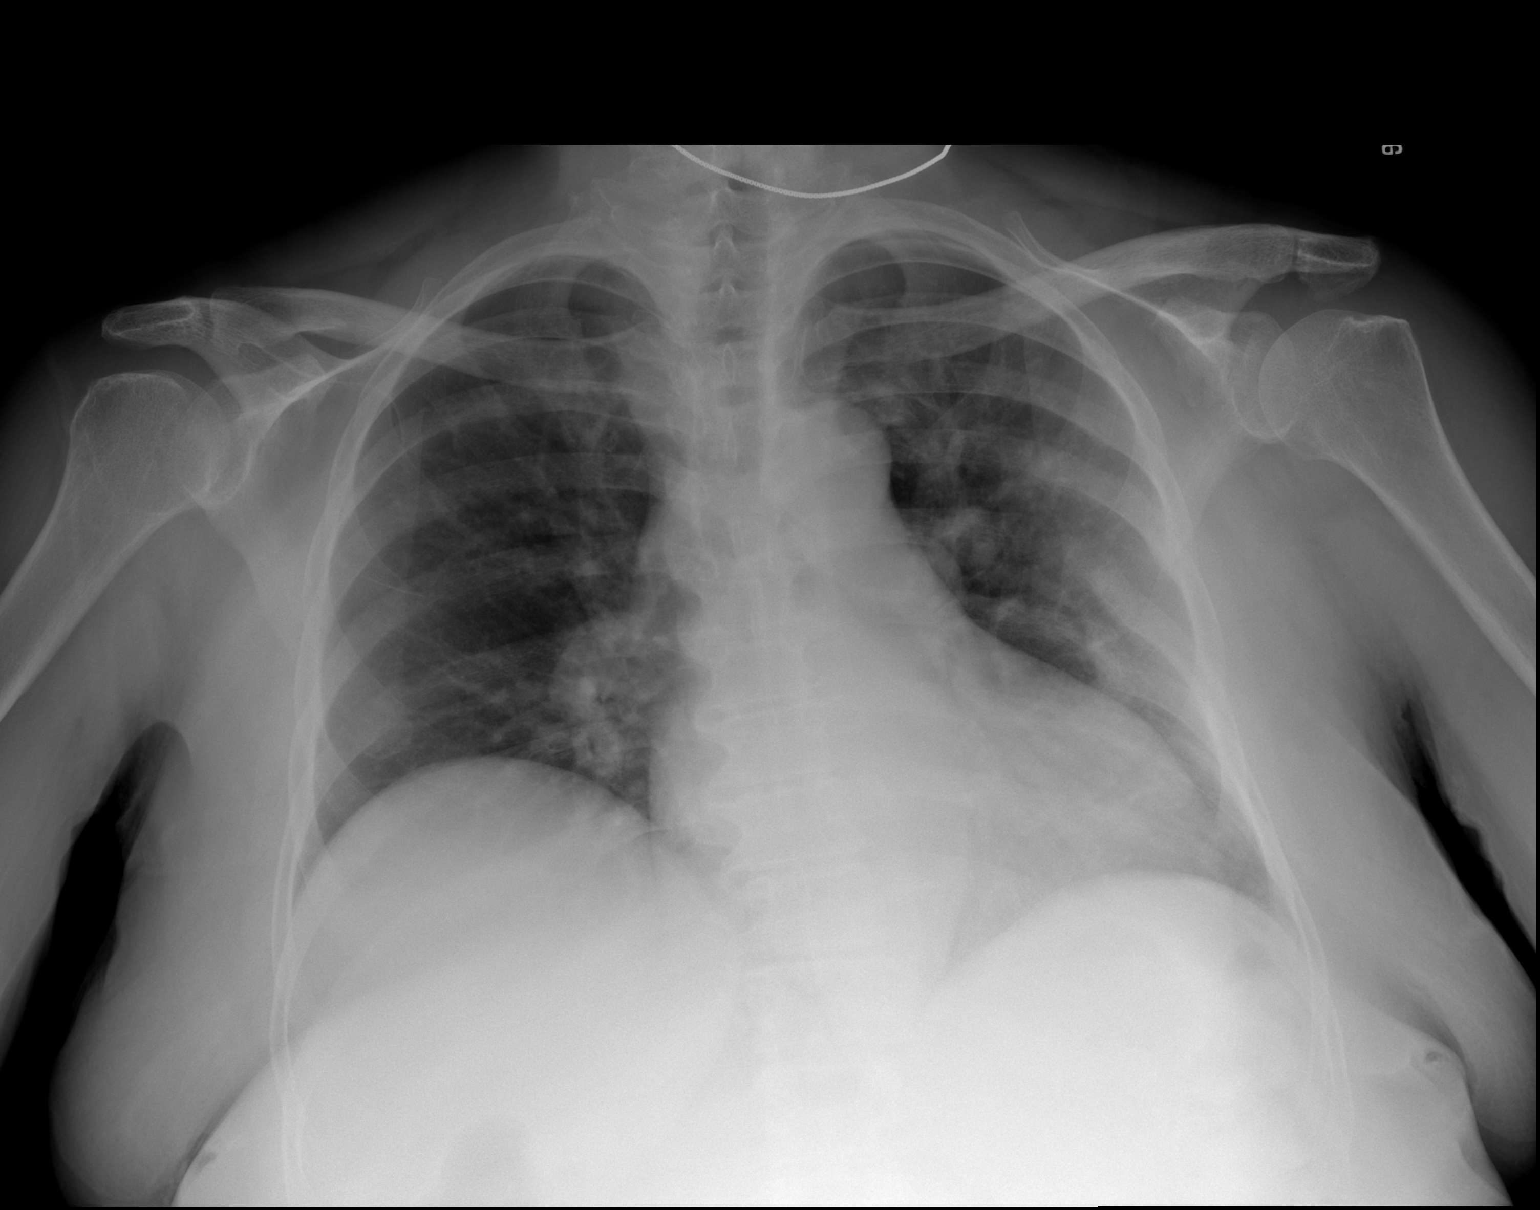

[2 of 2 positions shown; findings below may reference images not displayed]

FINDINGS: Lung volumes are small, but are symmetric and are clear. No
pneumothorax or pleural effusion. Cardiac size is at the upper
limits of normal. Pulmonary vascularity is normal. Multiple remote
healed left rib fractures are noted.
IMPRESSION: Pulmonary hypoinflation

## 2024-02-07 NOTE — Assessment & Plan Note (Signed)
 Referral to psychiatry.  - Requested patient's current provider

## 2024-02-08 ENCOUNTER — Encounter: Payer: Self-pay | Admitting: Internal Medicine

## 2024-02-12 IMAGING — CR DG CHEST 2V
2 series · 2 of 2 positions shown · non-contrast
Comparison: Chest radiograph dated 02/24/2021.

CLINICAL DATA: Shortness of breath.

EXAM:
CHEST - 2 VIEW

[chest lat]
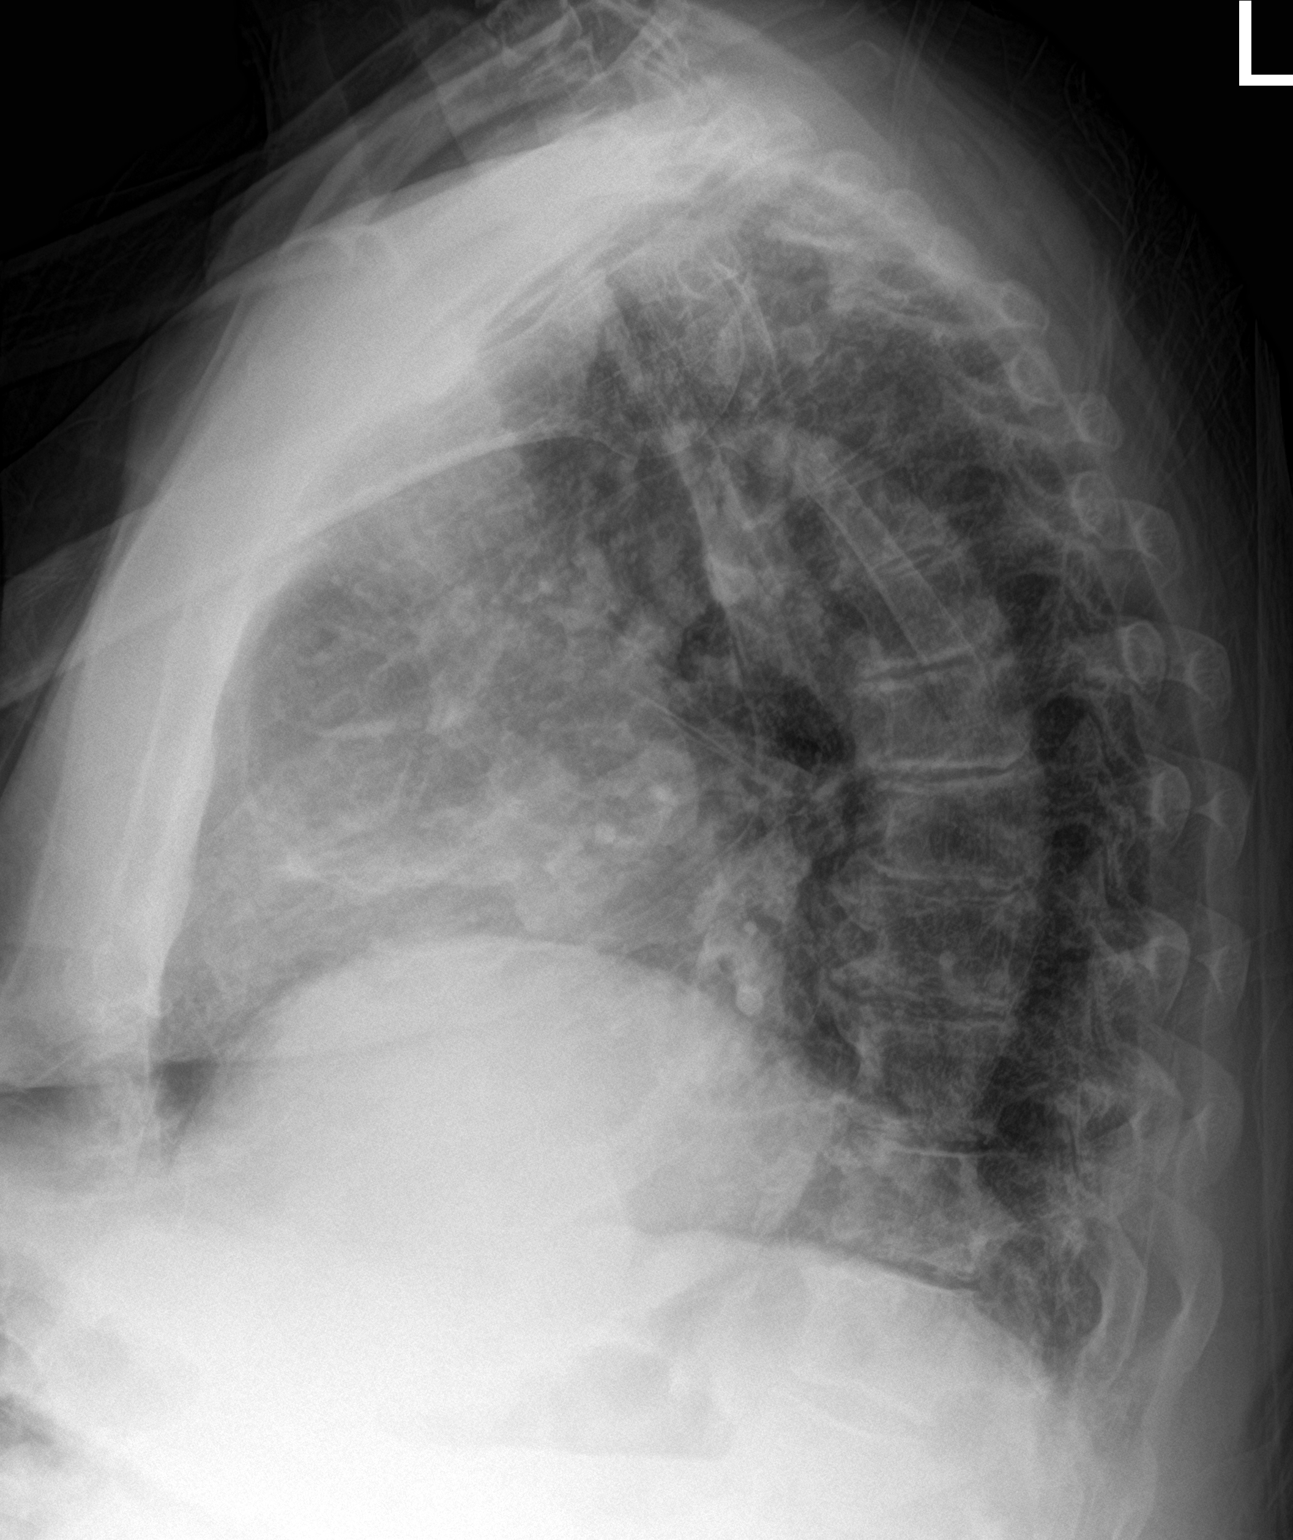

[chest ap]
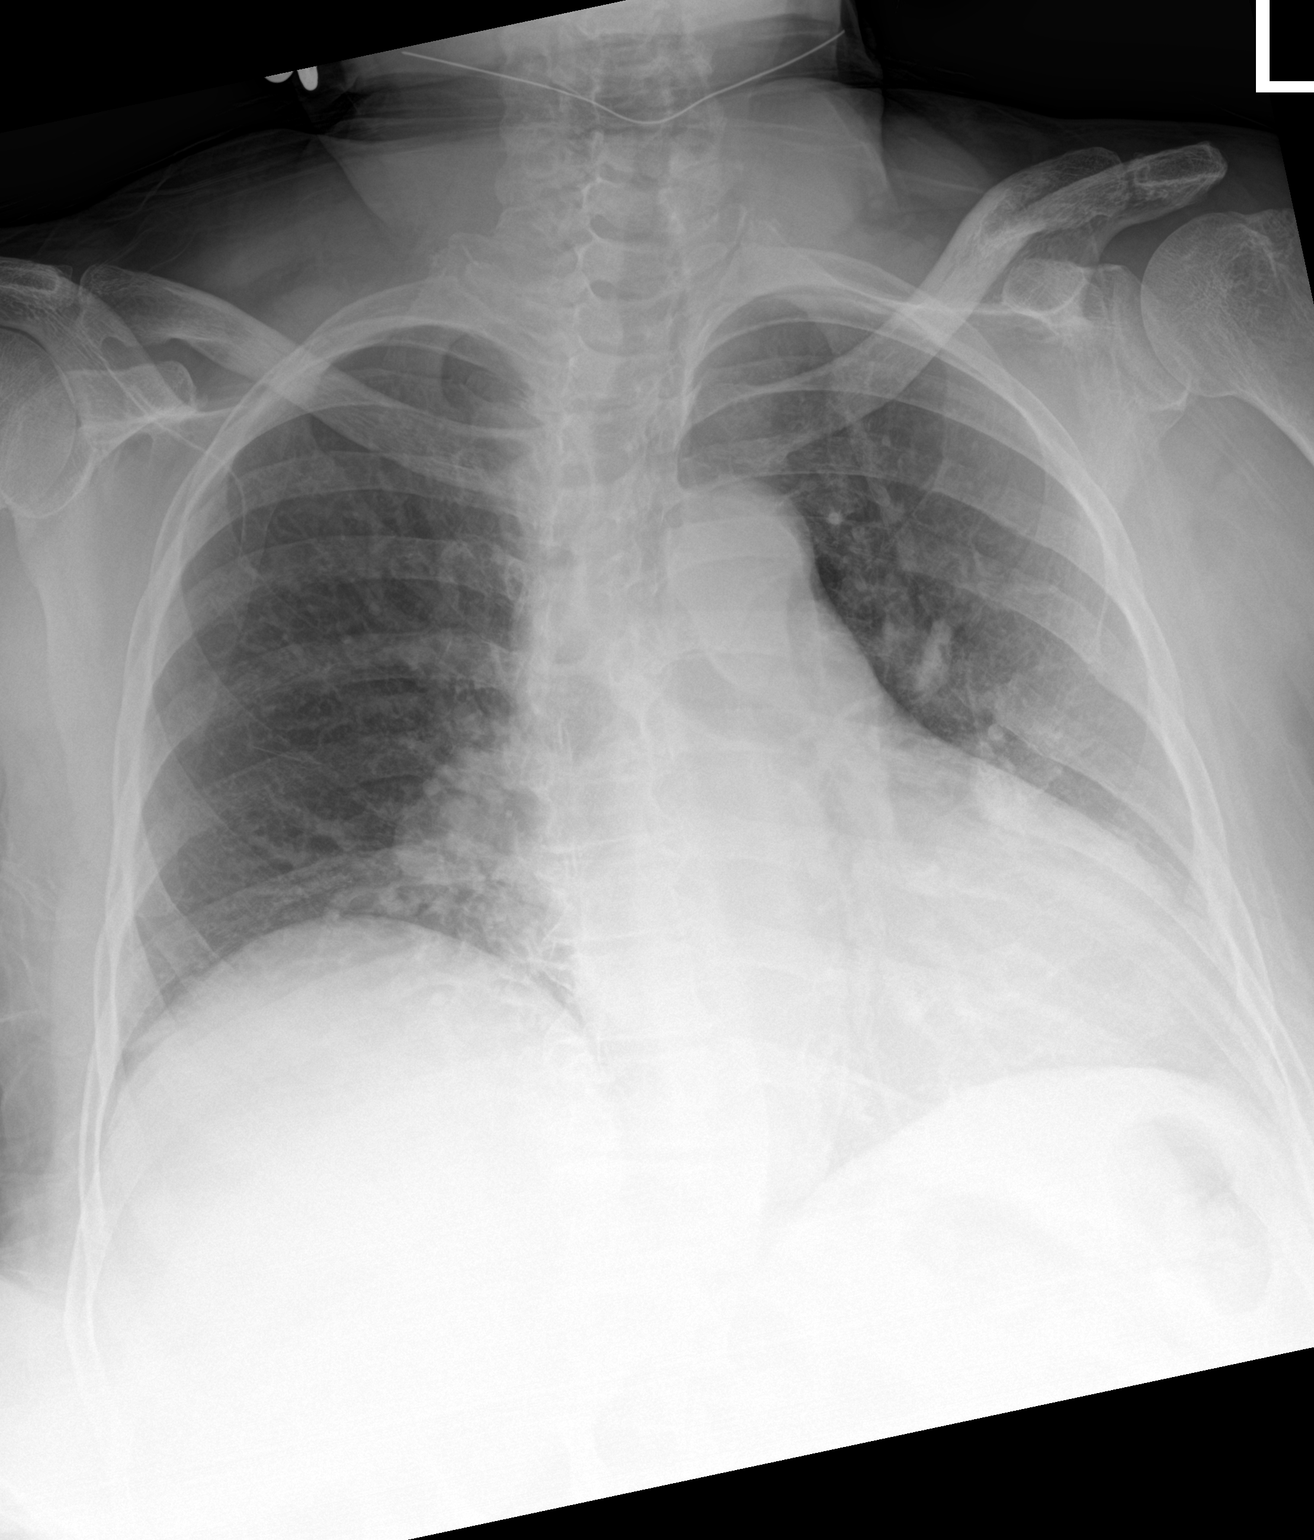

[2 of 2 positions shown; findings below may reference images not displayed]

FINDINGS: No focal consolidation, pleural effusion, pneumothorax.
Cardiomegaly. No acute osseous pathology. Degenerative changes of
the spine.
IMPRESSION: No active cardiopulmonary disease. Cardiomegaly.

## 2024-02-12 IMAGING — CR DG PELVIS 1-2V
1 series · 1 of 1 positions shown · non-contrast
Comparison: 07/09/2016

CLINICAL DATA: Bilateral hip pain

EXAM:
PELVIS - 1-2 VIEW

[pelvis ap]
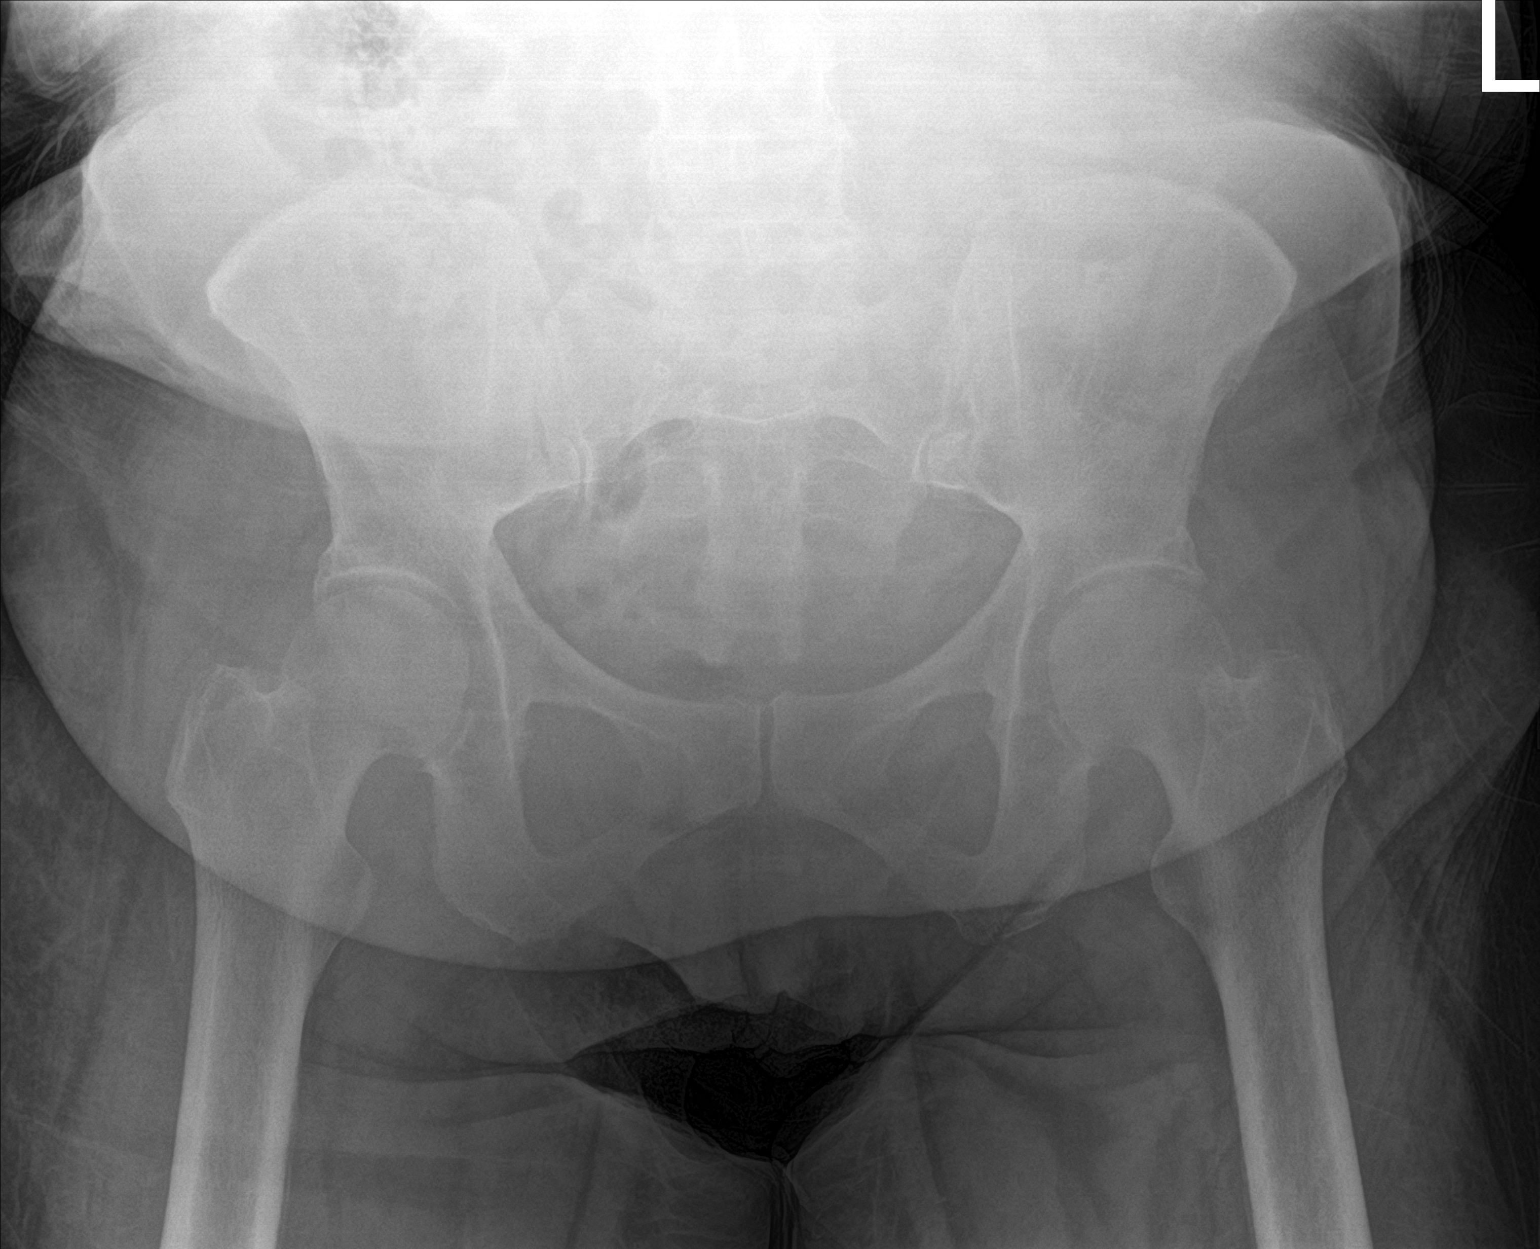

[1 of 1 positions shown; findings below may reference images not displayed]

FINDINGS: There is no evidence of pelvic fracture or diastasis on this single
view. Mild degenerative changes in the bilateral acetabula. No
pelvic bone lesions are seen.
IMPRESSION: No acute osseous abnormality.

## 2024-02-14 ENCOUNTER — Ambulatory Visit: Payer: Self-pay

## 2024-02-21 IMAGING — DX DG CHEST 1V PORT
1 series · 1 of 1 positions shown · non-contrast
Comparison: Chest radiograph 03/01/2021

CLINICAL DATA: Shortness of breath

EXAM:
PORTABLE CHEST 1 VIEW

[chest ap]
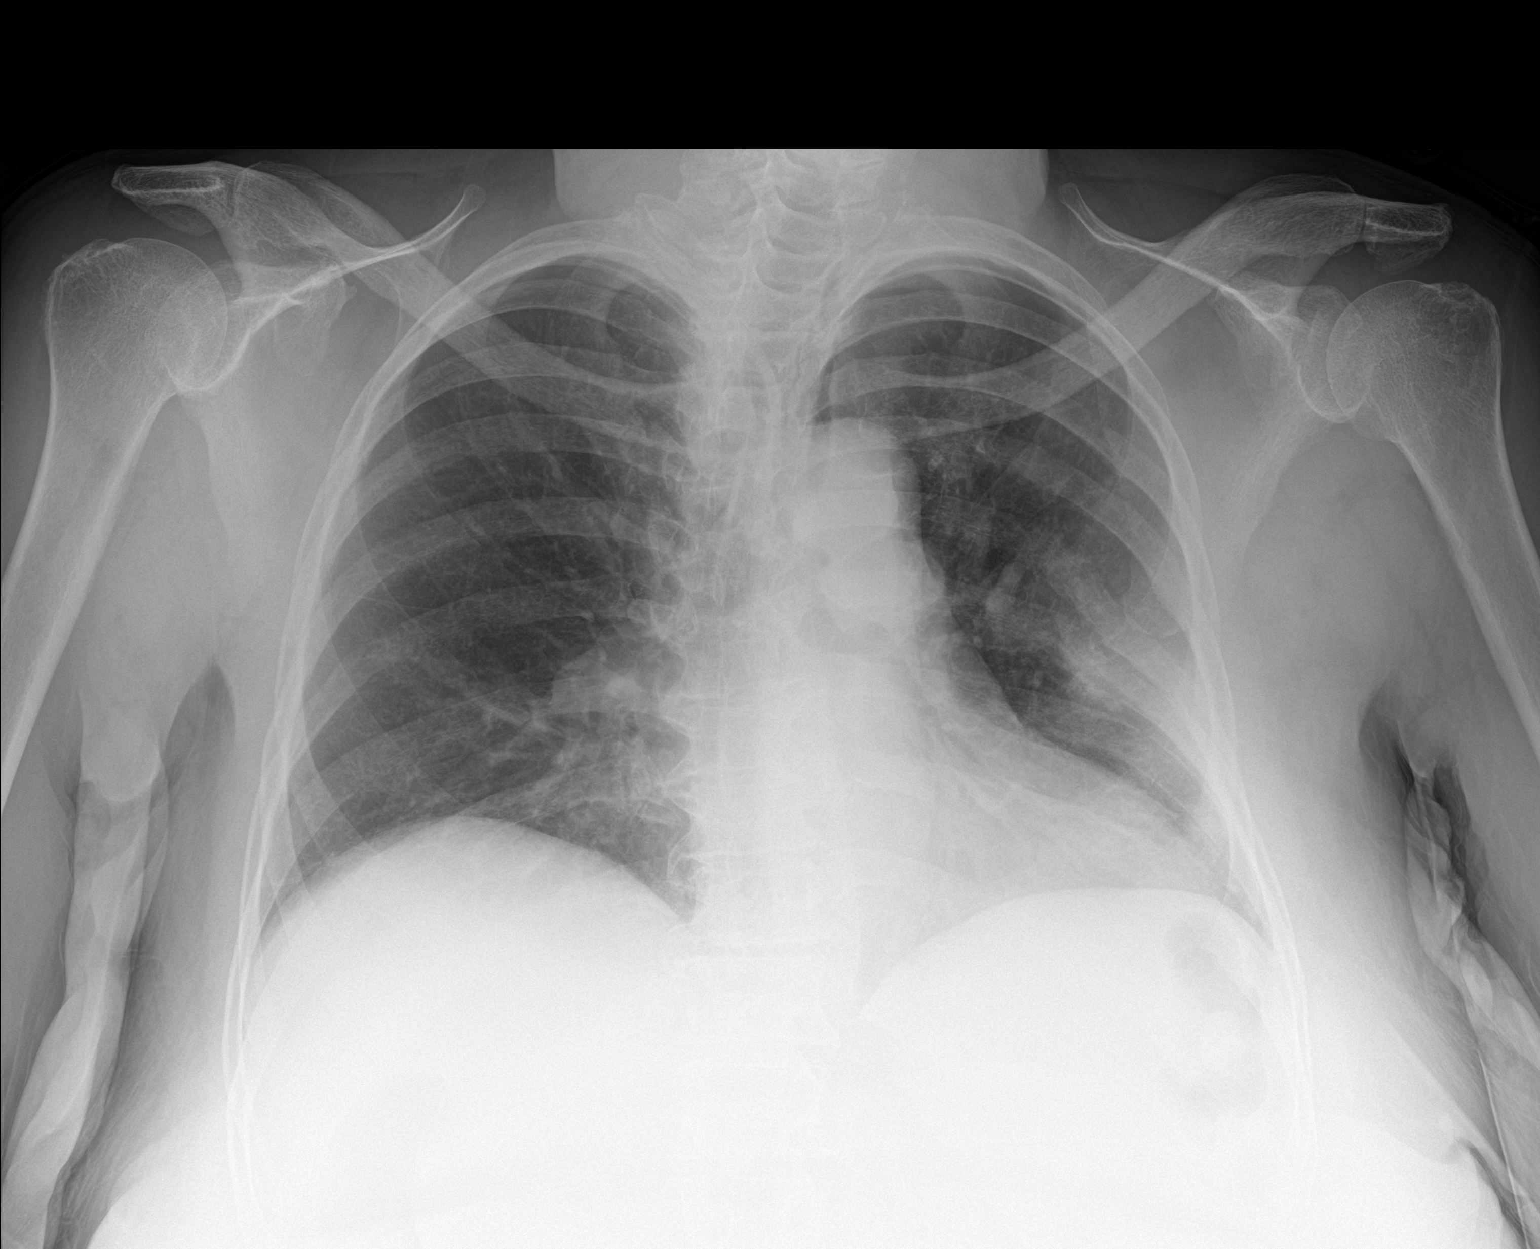

[1 of 1 positions shown; findings below may reference images not displayed]

FINDINGS: Cardiomediastinal silhouette is stable.

There is no focal consolidation. There is no pulmonary edema. There
is no pleural effusion or pneumothorax

There is no acute osseous abnormality.
IMPRESSION: Stable appearance of the chest since 03/01/2021. No new or worsening
focal airspace disease.

## 2024-02-22 ENCOUNTER — Ambulatory Visit: Payer: Medicare (Managed Care)

## 2024-02-22 VITALS — BP 135/85 | HR 85

## 2024-02-22 DIAGNOSIS — R29898 Other symptoms and signs involving the musculoskeletal system: Secondary | ICD-10-CM

## 2024-02-22 DIAGNOSIS — F250 Schizoaffective disorder, bipolar type: Secondary | ICD-10-CM

## 2024-02-22 DIAGNOSIS — M25571 Pain in right ankle and joints of right foot: Secondary | ICD-10-CM

## 2024-02-22 MED ORDER — LAMOTRIGINE 100 MG PO TABS: 100.0000 mg | ORAL_TABLET | Freq: Every day | ORAL | 3 refills | Status: AC

## 2024-02-22 NOTE — Progress Notes (Unsigned)
" ° ° °  SUBJECTIVE:   CHIEF COMPLAINT / HPI:   Dialysis yesterday. Crammed foot in btween.   PERTINENT  PMH / PSH: ***  OBJECTIVE:   BP 135/85   Pulse 85   SpO2 96%   ***  ASSESSMENT/PLAN:   Assessment & Plan      Raguel KANDICE Lee, DO [facility] "

## 2024-02-22 NOTE — Patient Instructions (Addendum)
 [address] [place], [place], [place] [address]  This is the address for imaging center. They have the order for your ankle x-ray. You can go whenever you are able to get your x-ray. They are open everyday of the week from 6:30 AM to 7 PM.   I have refilled your Lamictal  to [facility].   I have also put in home health orders for you. They should reach out to you to set that up. If you do not hear from them in 1 month, please let us  know.

## 2024-02-29 IMAGING — DX DG CHEST 2V
2 series · 2 of 2 positions shown · non-contrast
Comparison: 03/10/2021

CLINICAL DATA: Shortness of breath.

EXAM:
CHEST - 2 VIEW

[chest ap]
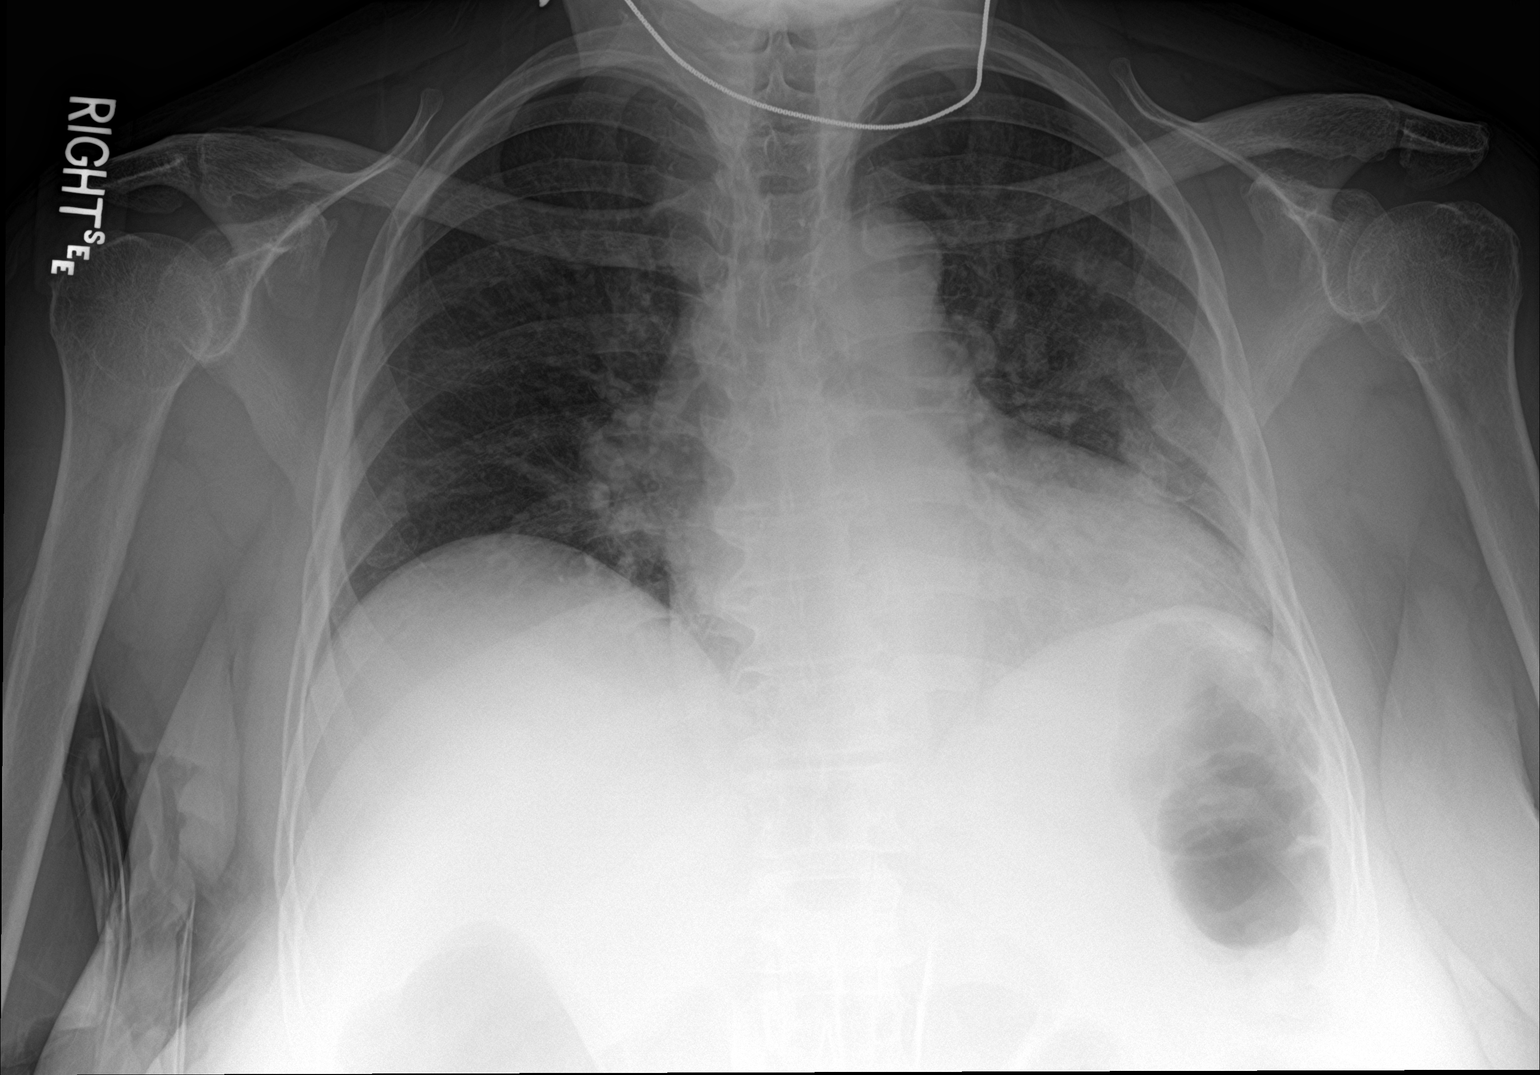

[chest lat]
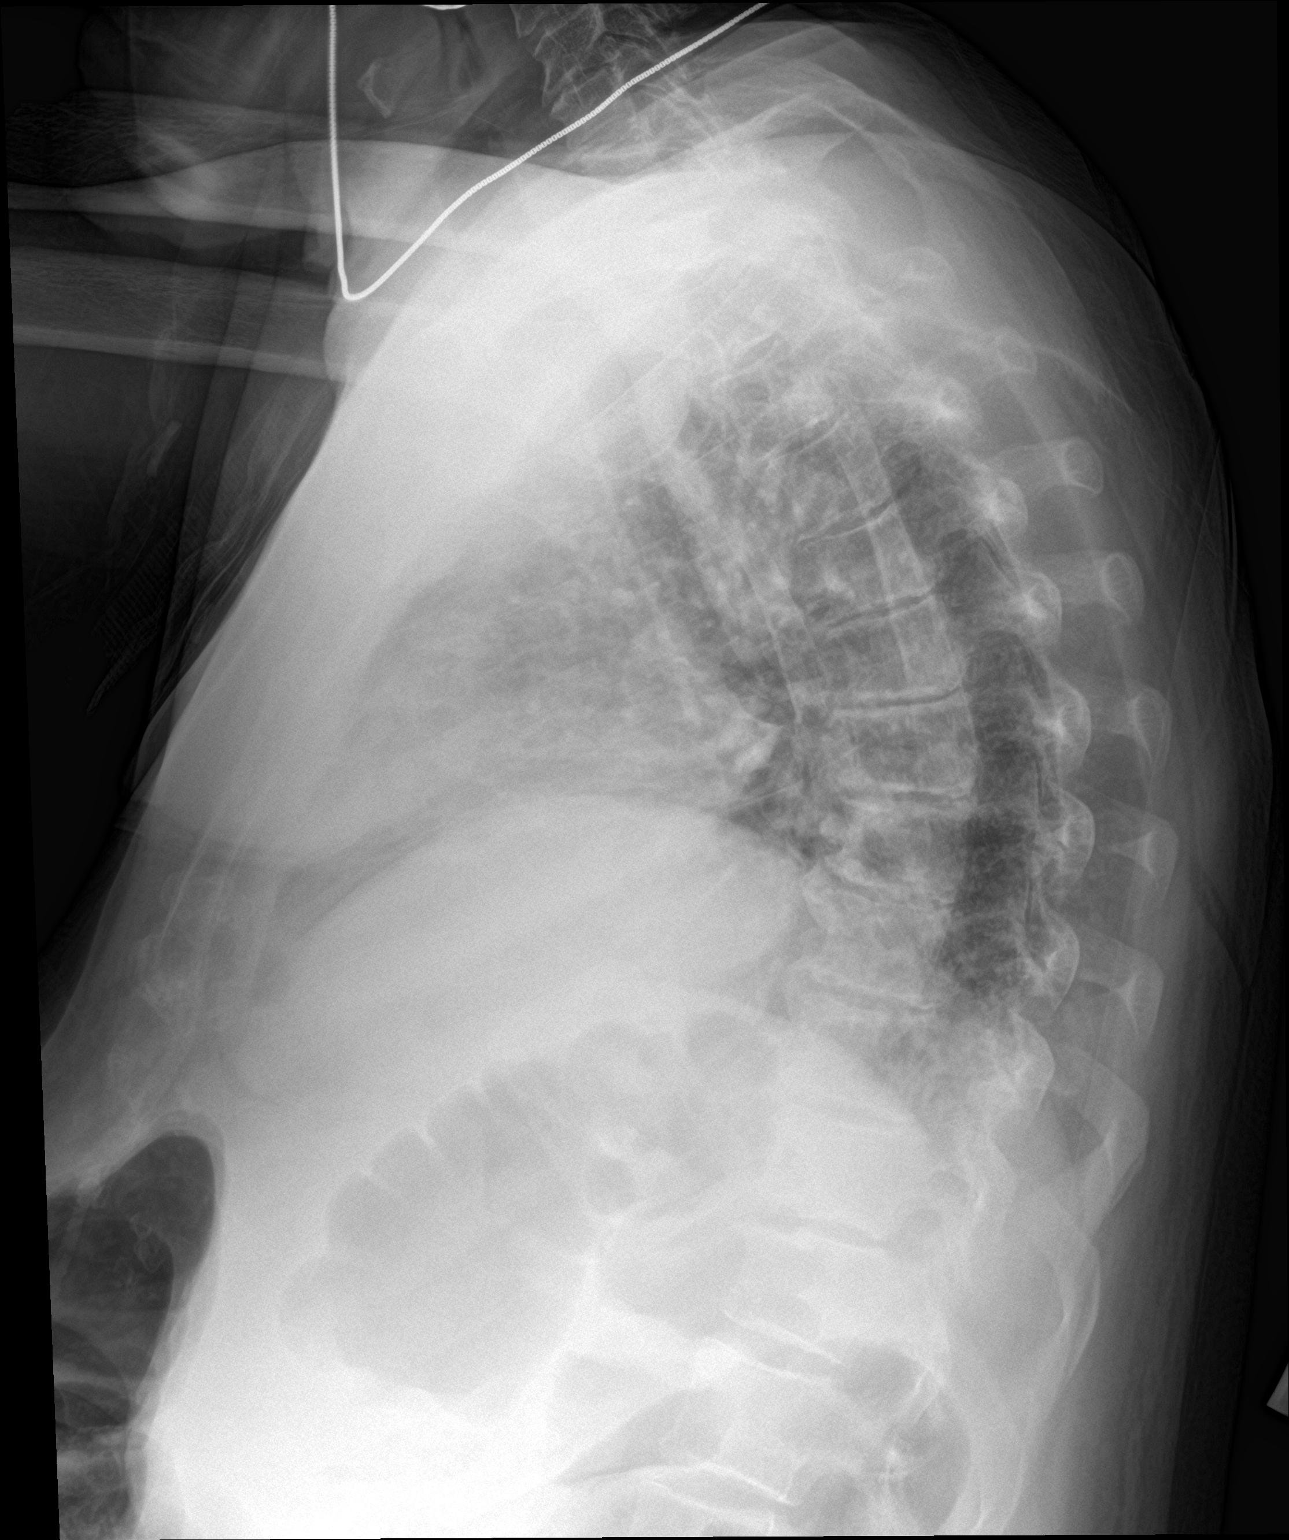

[2 of 2 positions shown; findings below may reference images not displayed]

FINDINGS: Low volume film. The cardio pericardial silhouette is enlarged. The
lungs are clear without focal pneumonia, edema, pneumothorax or
pleural effusion. Old left rib fractures again noted.
IMPRESSION: Low volume film without acute cardiopulmonary findings.

## 2024-03-11 IMAGING — CT CT ABD-PELV W/O CM
2 of 4 series · 16 of 46 positions shown, 18 images · non-contrast
Comparison: 07/09/2020 and pelvic ultrasound 07/23/2020

CLINICAL DATA: Left lower quadrant abdominal pain. Rectal bleeding.



[Series 3: abd/ pelvis 5.0 i30f 2 · axial · 0.84mm/px · z∈[+722,+1102]mm · 13 of 84 slices shown, 15 images]
[im 4/84  soft-tissue]
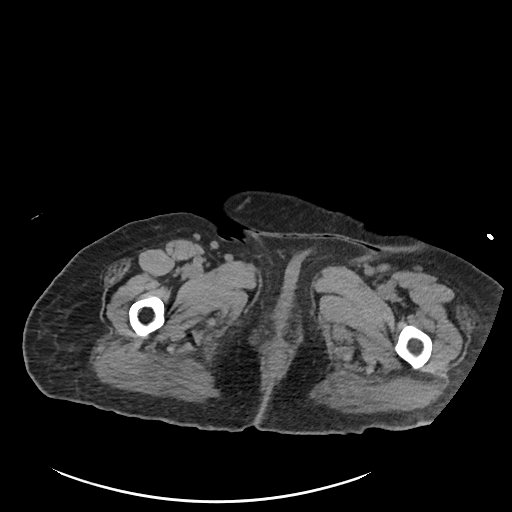
[im 4/84  bone]
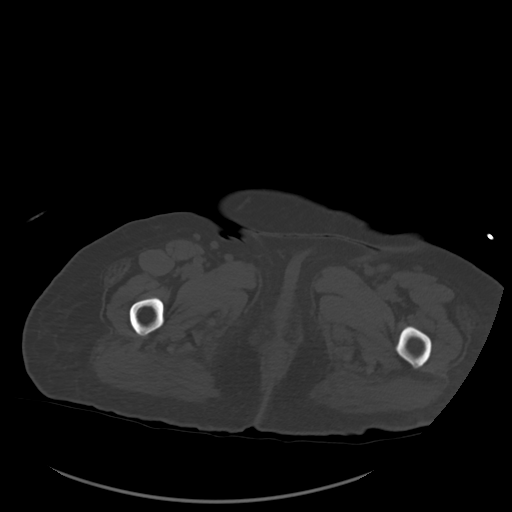
[im 11/84  soft-tissue]
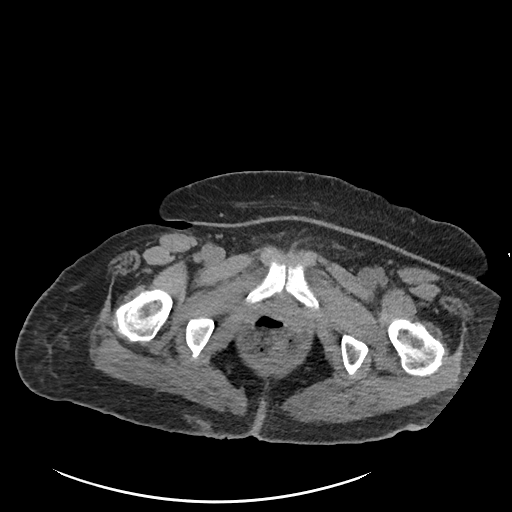
[im 18/84  soft-tissue]
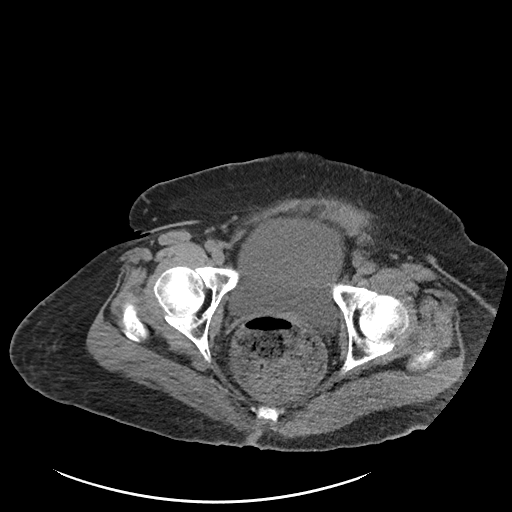
[im 25/84  soft-tissue]
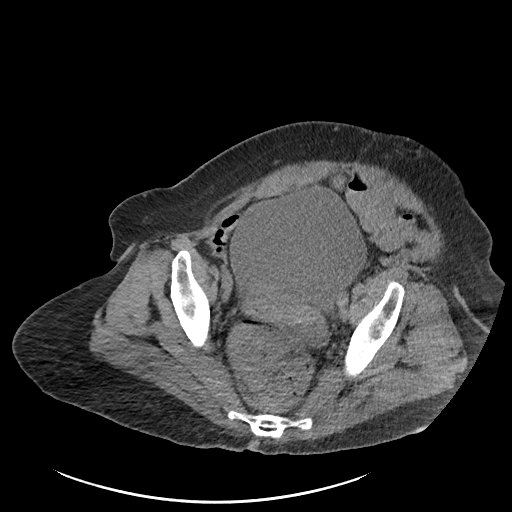
[im 28/84  soft-tissue]
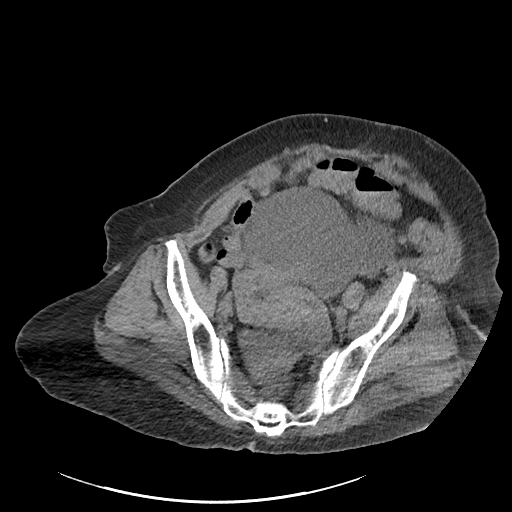
[im 35/84  soft-tissue]
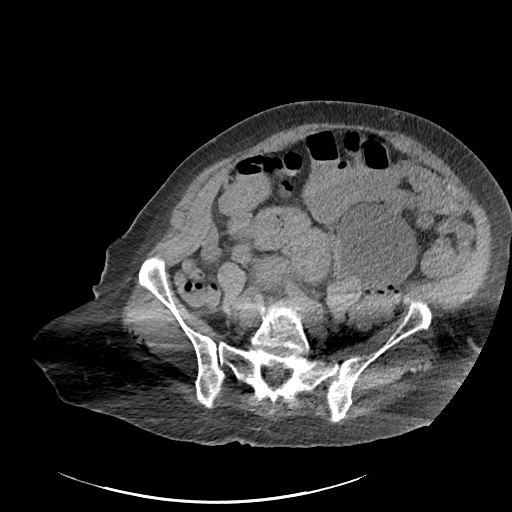
[im 42/84  soft-tissue]
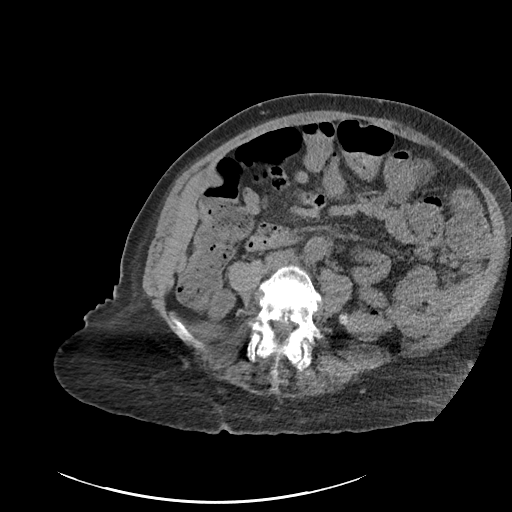
[im 49/84  soft-tissue]
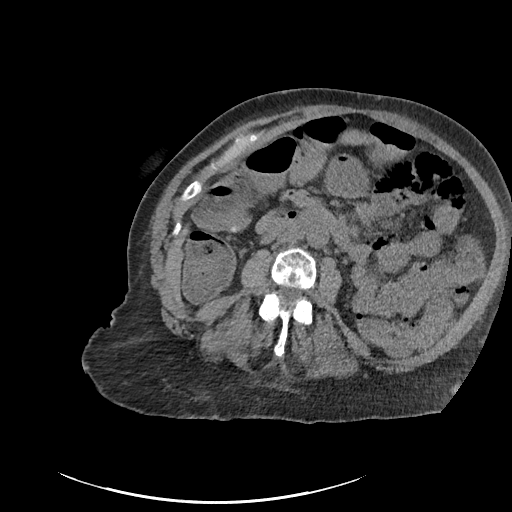
[im 56/84  soft-tissue]
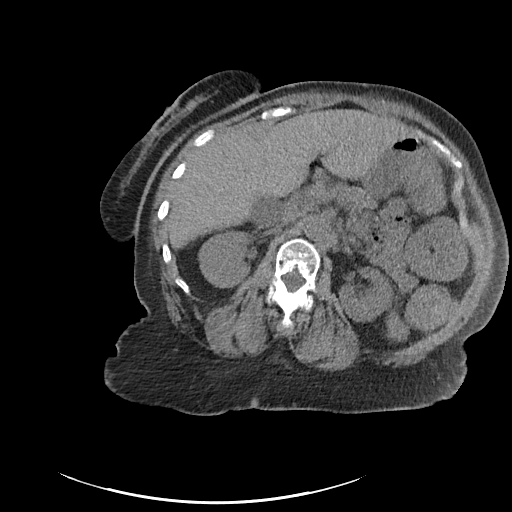
[im 56/84  bone]
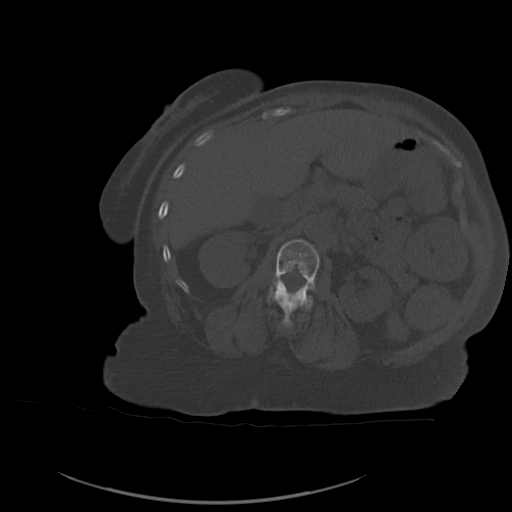
[im 59/84  soft-tissue]
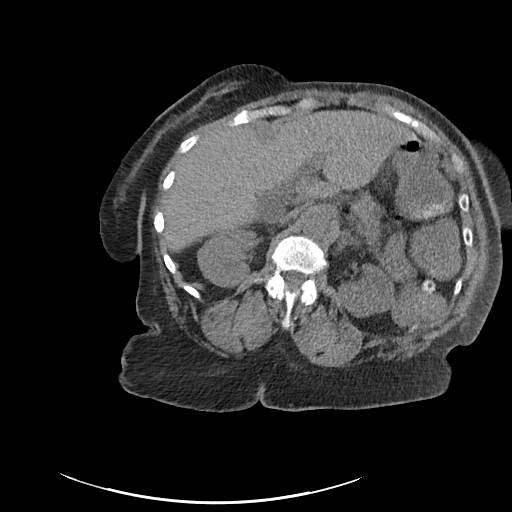
[im 66/84  soft-tissue]
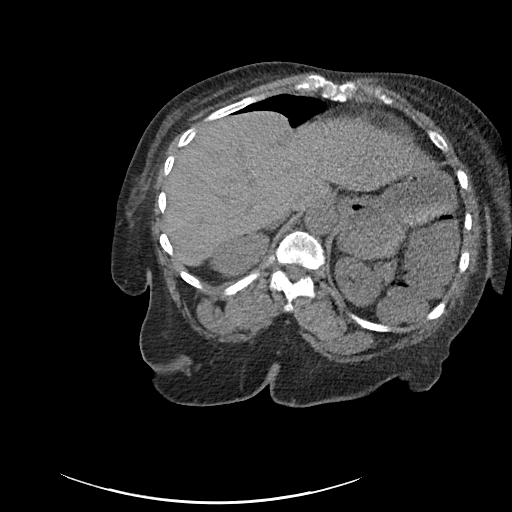
[im 73/84  soft-tissue]
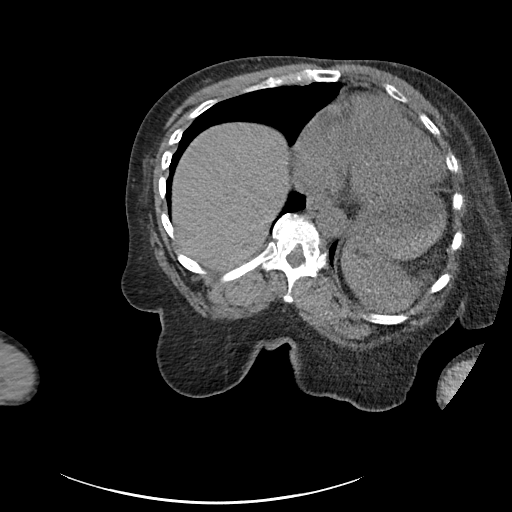
[im 80/84  soft-tissue]
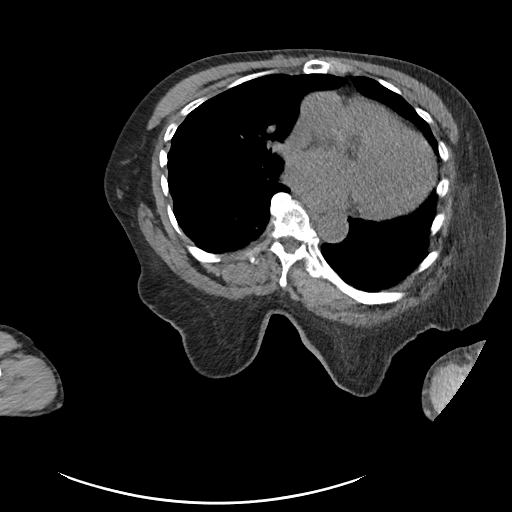

[Series 6: cor st · coronal · 0.76mm/px · 3 of 101 slices shown]
[im 34/101  soft-tissue]
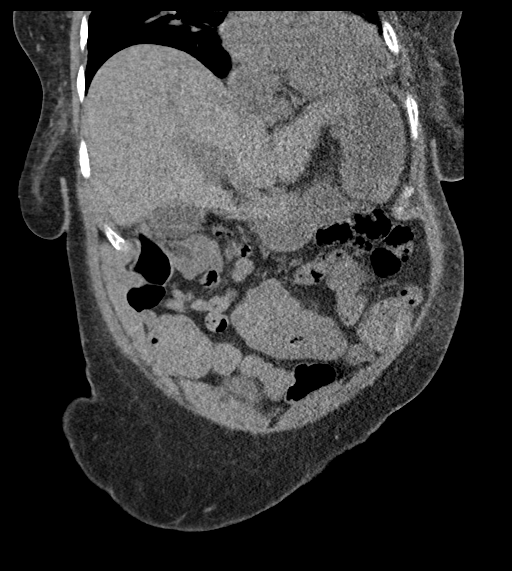
[im 45/101  soft-tissue]
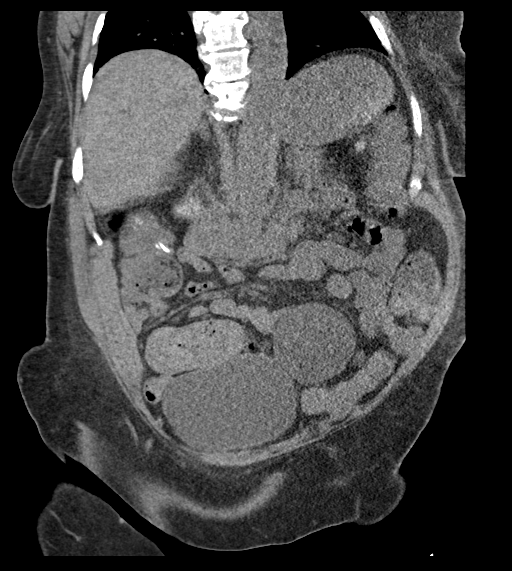
[im 56/101  soft-tissue]
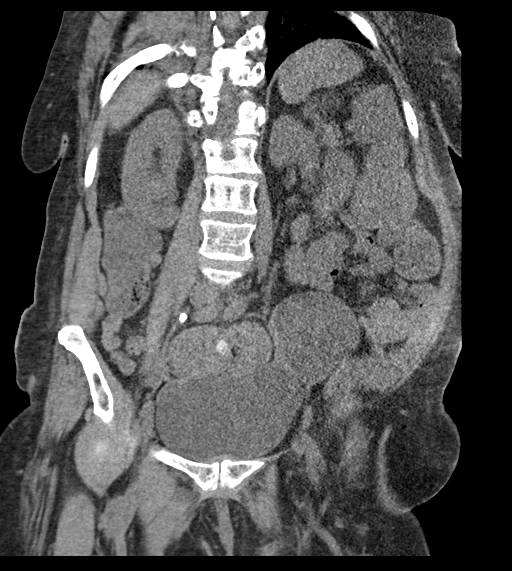

[16 of 46 positions shown; findings below may reference images not displayed]

FINDINGS: Lower chest: Lung bases are clear.

Hepatobiliary: Gallbladder is mildly distended with high-density
stones. Normal appearance of the liver.

Pancreas: Unremarkable. No pancreatic ductal dilatation or
surrounding inflammatory changes.

Spleen: Normal in size without focal abnormality.

Adrenals/Urinary Tract: Fullness of the left adrenal gland with
Hounsfield units measuring approximately 8. There could be a small
left adrenal adenoma. Right adrenal gland is unremarkable. Both
kidneys are small without hydronephrosis. Moderate distention of the
urinary bladder.

Stomach/Bowel: Large amount of stool in the rectum. Normal
appearance of the stomach. Left colon and sigmoid colon are mildly
distended and this may be secondary to the large amount of rectal
stool. There is no small bowel dilatation.

Vascular/Lymphatic: No significant lymphadenopathy in the abdomen or
pelvis. Negative for an abdominal aortic aneurysm.

Reproductive: Hysterectomy. Again noted is a large low-density
structure involving the left adnexa that measures 7.4 x 5.9 x 6.9 cm
and measured approximately 7.4 x 7.1 x 7.5 cm on 08/08/2020.
Overall, this structure has minimally changed. This was compatible
with a simple cyst on previous ultrasound. No evidence for a right
adnexal lesion.

Other: Negative for ascites.  Negative for free air.

Musculoskeletal: Progressive disc space loss at L3-L4. There is also
increased anterolisthesis of L3 on L4 measuring up to 1.0 cm and
previously measured 0.7 cm. Anterolisthesis appears to be related to
facet arthropathy.
IMPRESSION: 1. Persistent large left adnexal cyst. Left adnexal cyst has
minimally changed since [DATE]. Large amount of stool in the rectum. Sigmoid colon and left colon
are mildly distended and may be secondary to the rectal stool and
constipation. Cannot evaluate for GI bleeding on this non contrast
examination.
3. Cholelithiasis.  Mild gallbladder distension.
4. Progressive disc space loss and anterolisthesis at L3-L4.

## 2024-04-06 IMAGING — MR MR THORACIC SPINE WO/W CM
9 of 10 series · 36 of 48 positions shown · IV contrast (gadavist)
Comparison: Abdominopelvic CT 04/24/2021, CT cervical spine
07/22/2020, lumbar MRI 12/29/2019 and chest CTA 11/03/2016.

CLINICAL DATA: Myelopathy, chronic, thoracic pain. Low back pain.
Cauda equina syndrome suspected.

EXAM:
MRI THORACIC AND LUMBAR SPINE WITHOUT AND WITH CONTRAST
TECHNIQUE: Multiplanar and multiecho pulse sequences of the thoracic and lumbar
spine were obtained without and with intravenous contrast.
CONTRAST:  7mL GADAVIST GADOBUTROL 1 MMOL/ML IV SOLN

[Series 16: T1 · sagittal · 4.0mm · 1.72mm/px · 1 of 5 slices shown (1 of 4)]
[im 1/5]
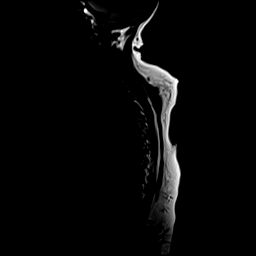

[Series 17: STIR · sagittal · 3.0mm · 1.00mm/px · 3 of 19 slices shown]
[im 1/19]
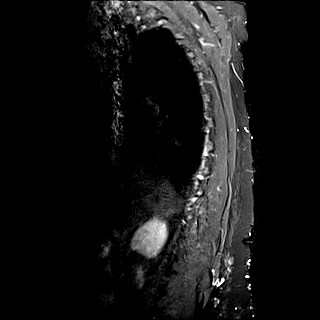
[im 10/19]
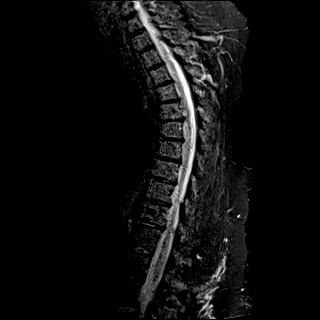
[im 19/19]
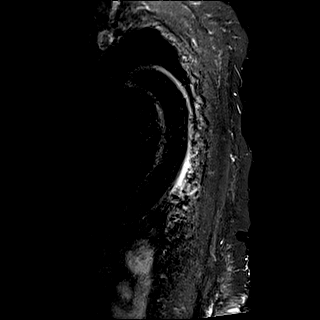

[Series 18: T1 · sagittal · 3.0mm · 1.00mm/px · 3 of 19 slices shown (2 of 4)]
[im 1/19]
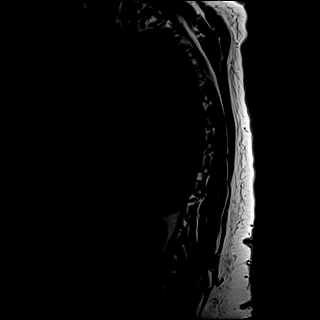
[im 10/19]
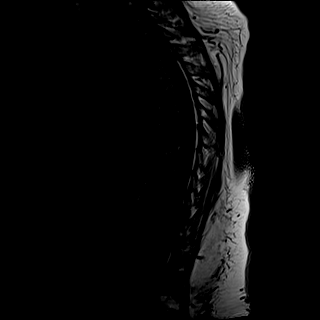
[im 19/19]
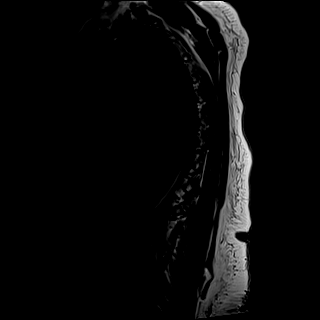

[Series 20: T2 · axial · 4.0mm · 0.78mm/px · z∈[-316,-96]mm · 7 of 49 slices shown]
[im 1/49]
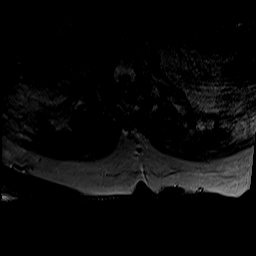
[im 9/49]
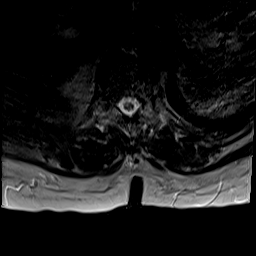
[im 17/49]
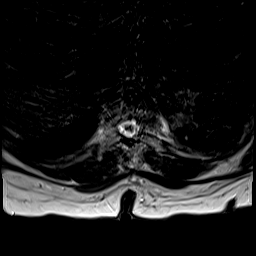
[im 25/49]
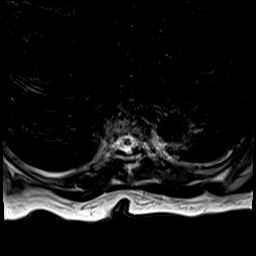
[im 33/49]
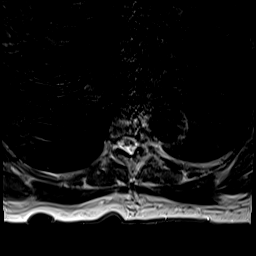
[im 41/49]
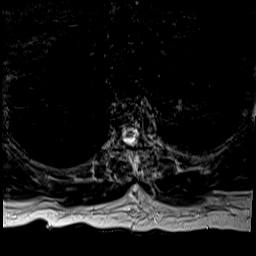
[im 49/49]
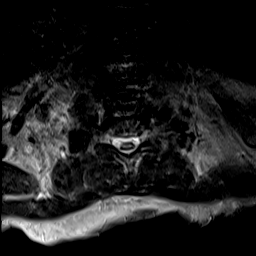

[Series 22: T1 · axial · 4.0mm · 0.52mm/px · z∈[-316,-96]mm · 7 of 49 slices shown (3 of 4)]
[im 1/49]
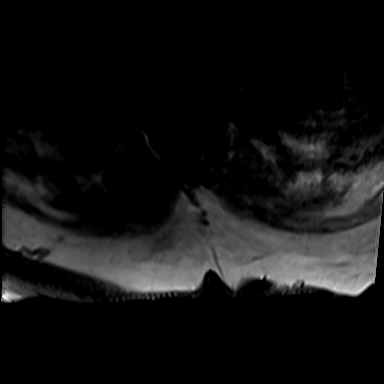
[im 9/49]
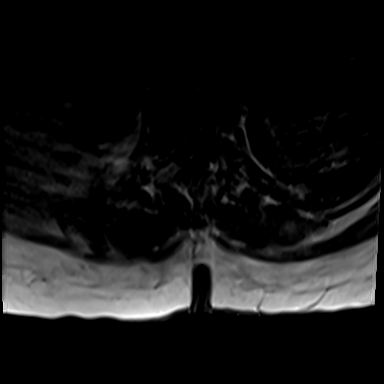
[im 17/49]
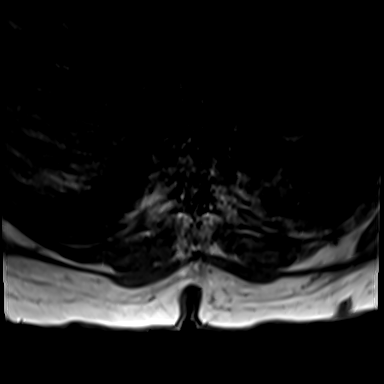
[im 25/49]
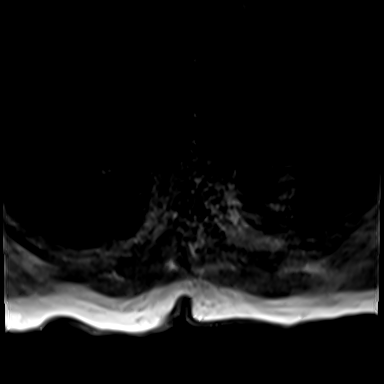
[im 33/49]
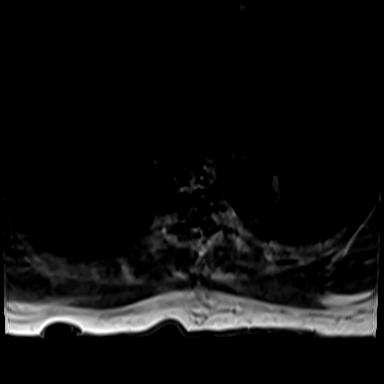
[im 41/49]
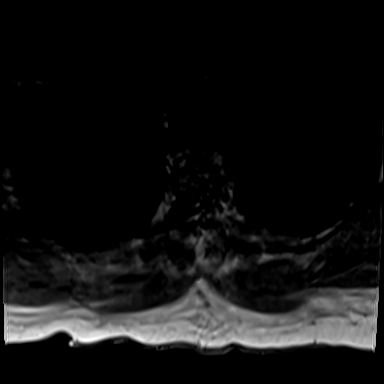
[im 49/49]
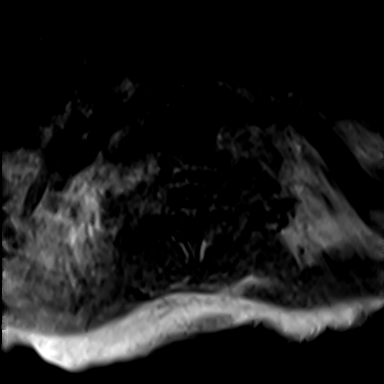

[Series 23: T1 · axial · 4.0mm · 0.78mm/px · z∈[-316,-96]mm · 7 of 49 slices shown (4 of 4)]
[im 1/49]
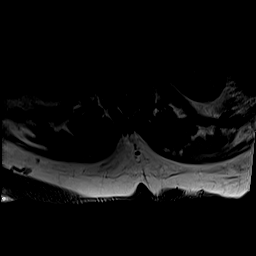
[im 9/49]
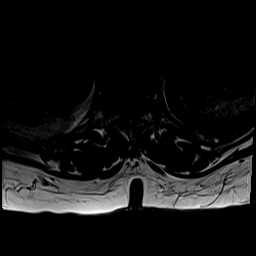
[im 17/49]
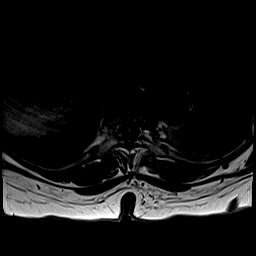
[im 25/49]
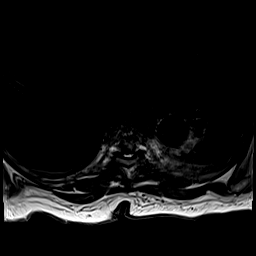
[im 33/49]
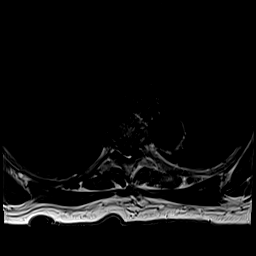
[im 41/49]
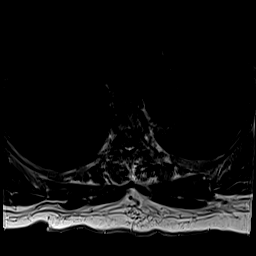
[im 49/49]
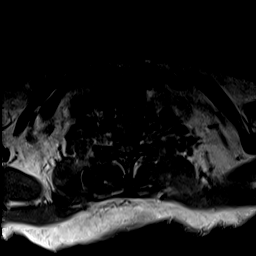

[Series 24: T2 post-contrast · sagittal · 3.0mm · 0.83mm/px · 3 of 19 slices shown]
[im 1/19]
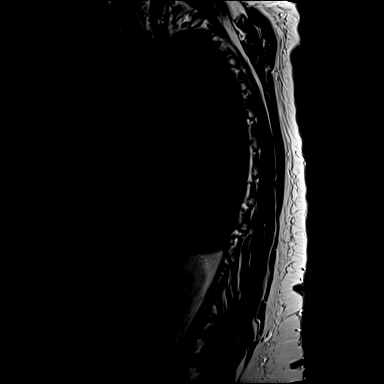
[im 10/19]
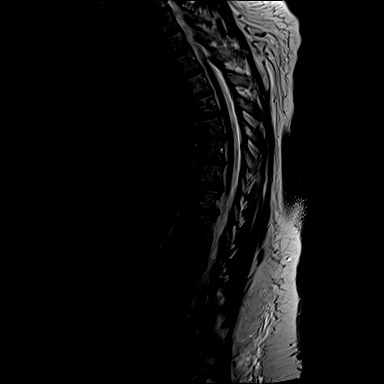
[im 19/19]
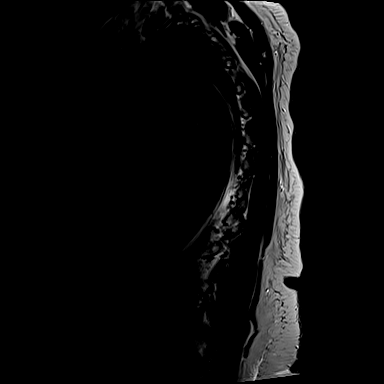

[Series 25: T1 fat-sat post-contrast · sagittal · 3.0mm · 1.00mm/px · 3 of 19 slices shown]
[im 1/19]
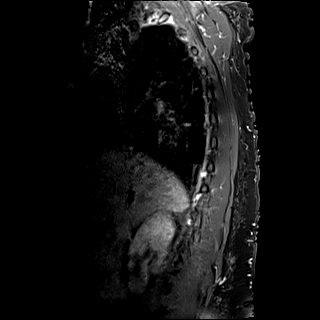
[im 10/19]
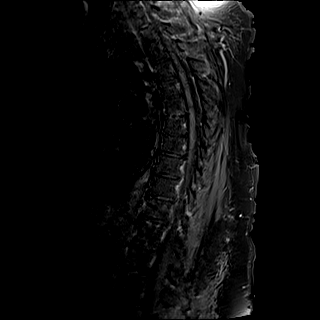
[im 19/19]
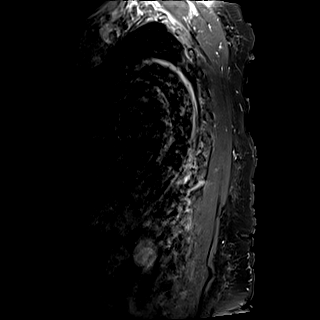

[Series 26: T1 post-contrast · axial · 4.0mm · 0.39mm/px · z∈[-290,-256]mm · 2 of 44 slices shown]
[im 1/44]
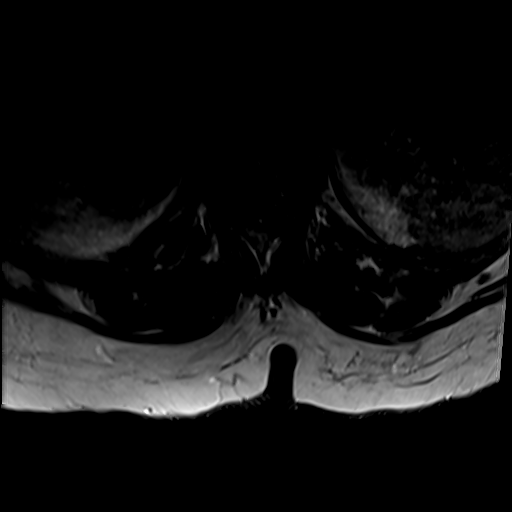
[im 8/44]
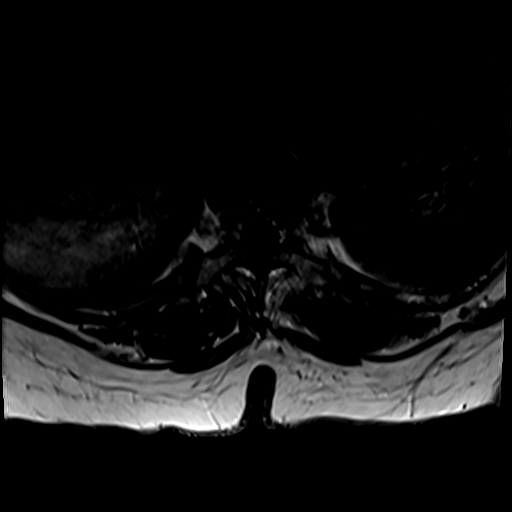

[36 of 48 positions shown; findings below may reference images not displayed]

FINDINGS: Despite efforts by the technologist and patient, moderate to severe
motion artifact is present on today's exam and could not be
eliminated. This reduces exam sensitivity and specificity. Motion is
greatest on the axial images.

MRI THORACIC SPINE FINDINGS

Alignment:  Physiologic.

Vertebrae: No acute or suspicious osseous findings. Serpiginous
lesion in the left aspect of the T11 vertebral body with low T1 and
low T2 signal shows nonspecific enhancement following contrast. This
lesion is unchanged from the 1699 CT, consistent with a benign
finding.

Cord: Cord evaluation limited by motion. No abnormal cord signal or
enhancement identified.

Paraspinal and other soft tissues: No significant paraspinal
findings.

Disc levels:

Multilevel spondylosis with disc bulging and posterior osteophytes.
Details at the individual levels are limited by motion artifact,
although no large disc herniation, cord deformity or high-grade
foraminal narrowing is seen.

MRI LUMBAR SPINE FINDINGS

Segmentation: Transitional lumbosacral anatomy. There is a
transitional, nearly fully lumbarized S1 segment. Concordant with
previous imaging.

Alignment: As seen on recent CT, there is a progressive
anterolisthesis at L4-5 compared with prior abdominal CTs and MRI,
now measuring approximately 5 mm. A mild degenerative
anterolisthesis at L5-S1 is unchanged.

Vertebrae: As seen on CT, there is progressive endplate irregularity
and collapse at L4-5. There is associated mild endplate T2
hyperintensity and enhancement. There is hyperintensity within the
L4-5 disc. There is also fluid and enhancement within the facet
joints bilaterally. No other significant osseous abnormalities. The
visualized sacroiliac joints appear unremarkable.

Conus medullaris: Extends to the L2 level and appears normal.

Paraspinal and other soft tissues: No focal paraspinal fluid
collections are identified. Patient has a known large cystic adnexal
mass which is incompletely visualized by the current study, although
fully seen on CT earlier today and grossly unchanged from previous
lumbar MRI of 12/29/2019.

Disc levels:

Disc space evaluation limited by motion. From T12-L1 through L2-3,
the disc heights are maintained, and there is no disc herniation or
spinal stenosis.

L3-4: Minimal disc bulging. Mild facet and ligamentous hypertrophy.
No significant spinal stenosis or nerve root encroachment.

L4-5: Advanced facet hypertrophy with bilateral facet joint
effusions and synovial enhancement. As above, T2 hyperintensity
within the disc with endplate irregularity, edema and mild
enhancement. There is a broad-based disc extrusion which contributes
to severe spinal stenosis. There is significant narrowing of the
lateral recesses and foramina bilaterally. These findings have
significantly progressed from the previous lumbar MRI.

L5-S1: Chronic bilateral facet hypertrophy with annular disc bulging
and a grade 1 anterolisthesis. Mild spinal stenosis and mild
narrowing of the lateral recesses and left foramen, similar to
previous MRI.

S1-2: Transitional disc space level demonstrates stable mild facet
hypertrophy. No significant spinal stenosis or nerve root
encroachment.
IMPRESSION: 1. Study is degraded by moderate to severe motion artifact.
Intraspinal detail is limited.
2. No acute findings or high-grade spinal stenosis demonstrated in
the thoracic spine. There is mild multilevel spondylosis.
3. As demonstrated on recent CTs, progressive anterolisthesis at
L4-5 with discal hyperintensity, endplate irregularity, edema and
low level enhancement. Findings could be secondary to chronic
discitis/osteomyelitis or instability. No paraspinal fluid
collections are identified. There is resulting severe multifactorial
spinal stenosis with significant lateral recess and foraminal
narrowing bilaterally, likely contributing to cauda equina syndrome.
4. Stable mild spinal stenosis and mild lateral recess and left
foraminal narrowing at L5-S1.
5. Transitional lumbosacral anatomy.

## 2024-04-06 IMAGING — CT CT ABD-PELV W/O CM
2 of 4 series · 15 of 46 positions shown, 17 images · non-contrast
Comparison: CTs without contrast 03/29/2021 and 08/08/2020.

CLINICAL DATA: Right lower quadrant pain.



[Series 2: axial st · axial · 0.94mm/px · z∈[-390,-54]mm · 12 of 79 slices shown, 14 images]
[im 6/79  soft-tissue]
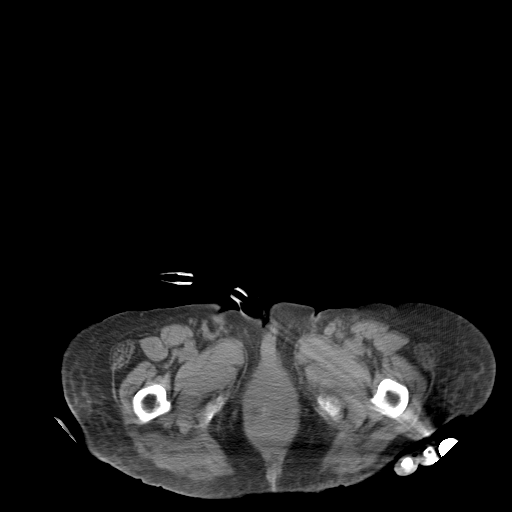
[im 6/79  bone]
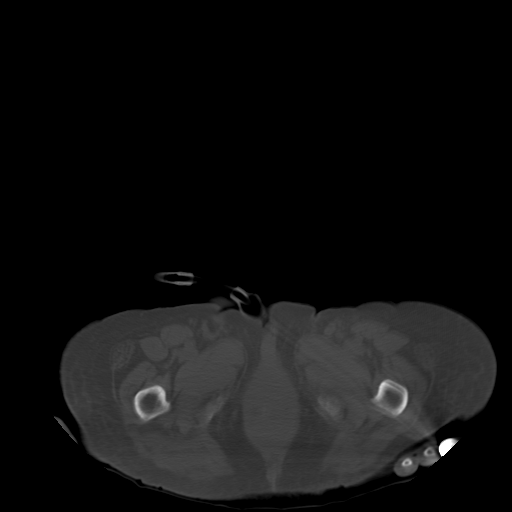
[im 11/79  soft-tissue]
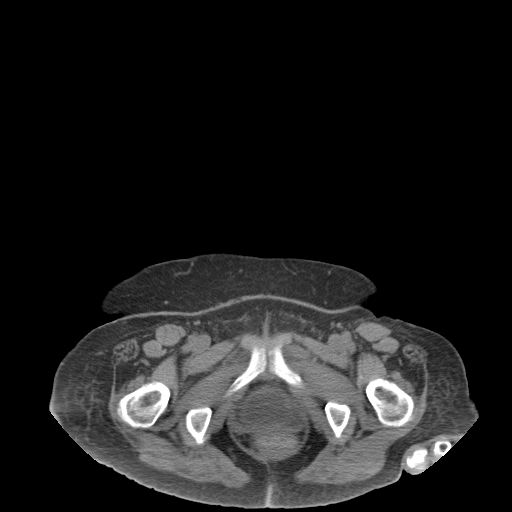
[im 16/79  soft-tissue]
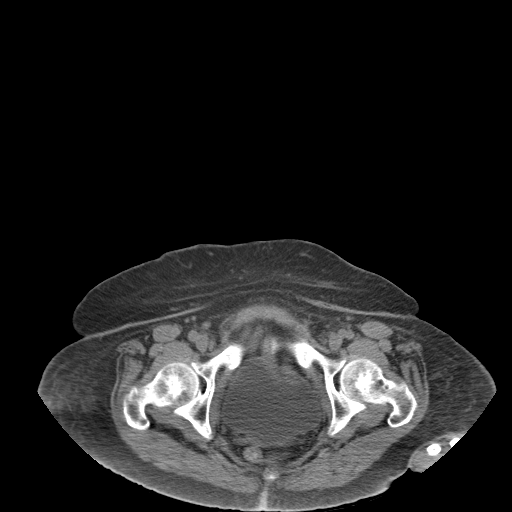
[im 27/79  soft-tissue]
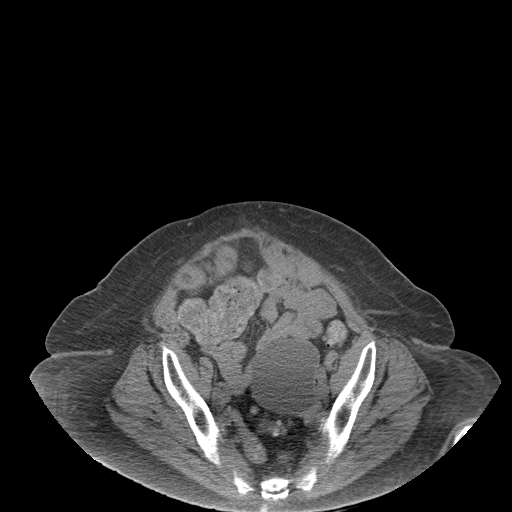
[im 32/79  soft-tissue]
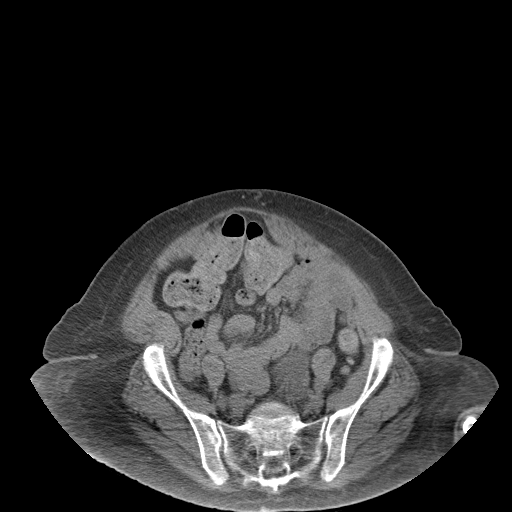
[im 37/79  soft-tissue]
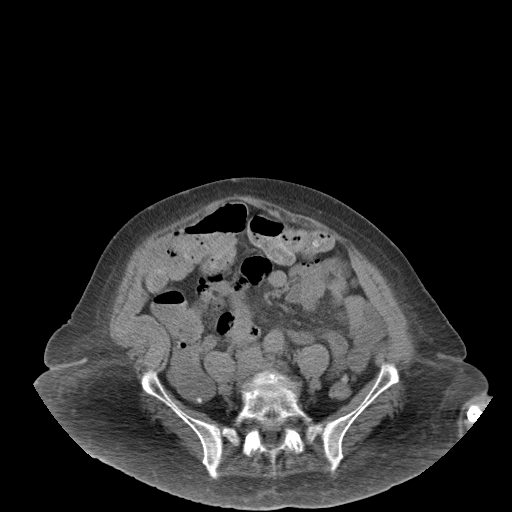
[im 42/79  soft-tissue]
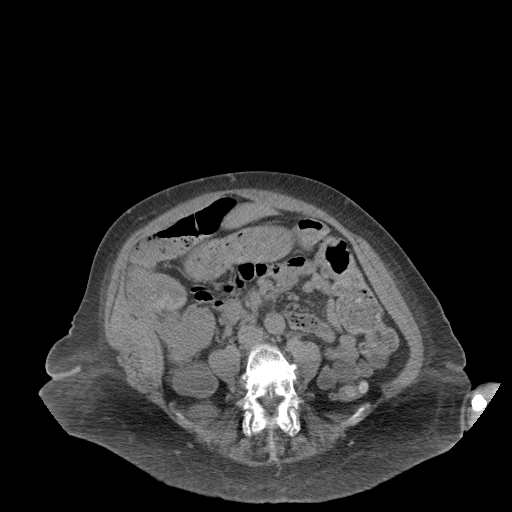
[im 47/79  soft-tissue]
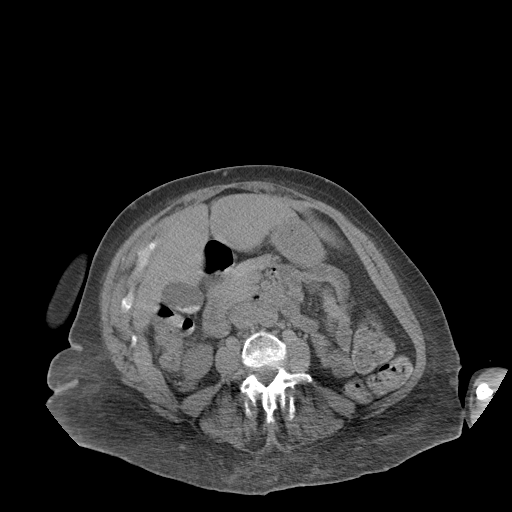
[im 53/79  soft-tissue]
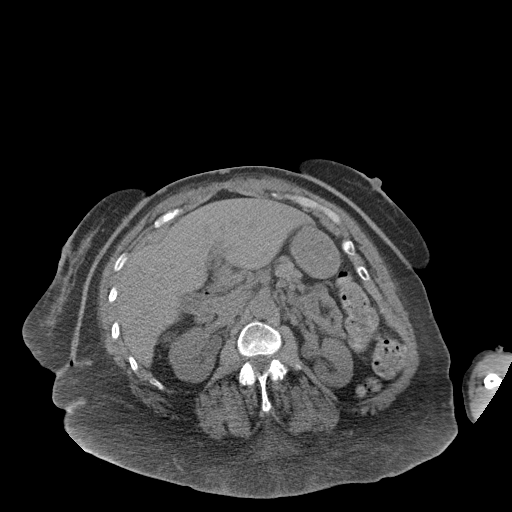
[im 53/79  bone]
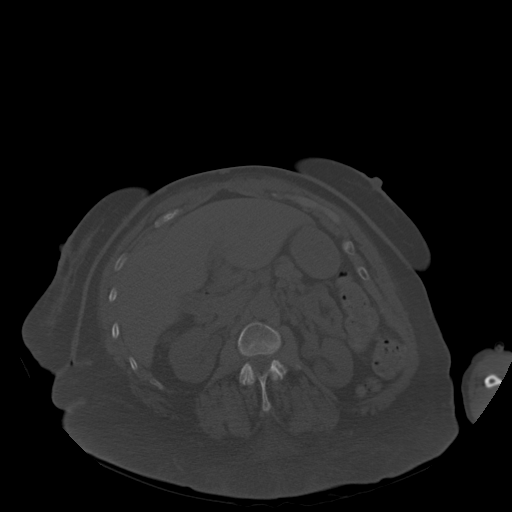
[im 63/79  soft-tissue]
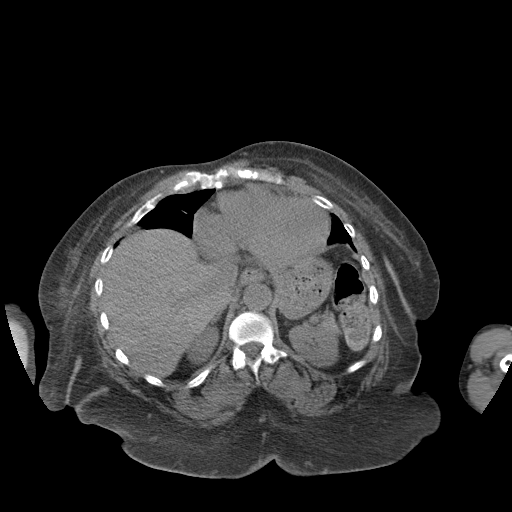
[im 68/79  soft-tissue]
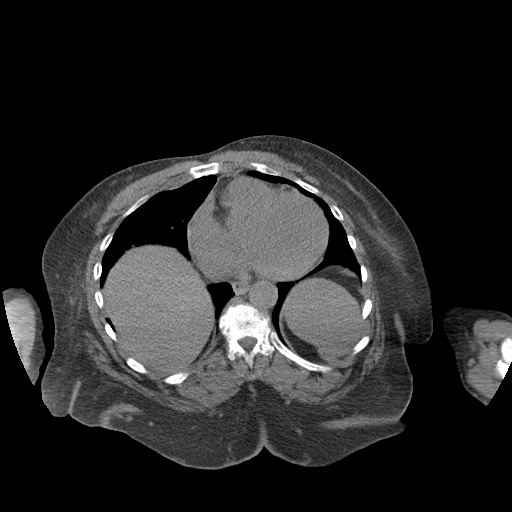
[im 73/79  soft-tissue]
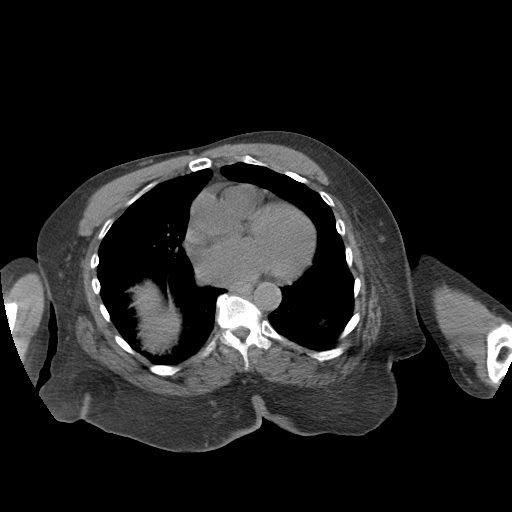

[Series 5: coronal st · coronal · 0.86mm/px · 3 of 187 slices shown]
[im 63/187  soft-tissue]
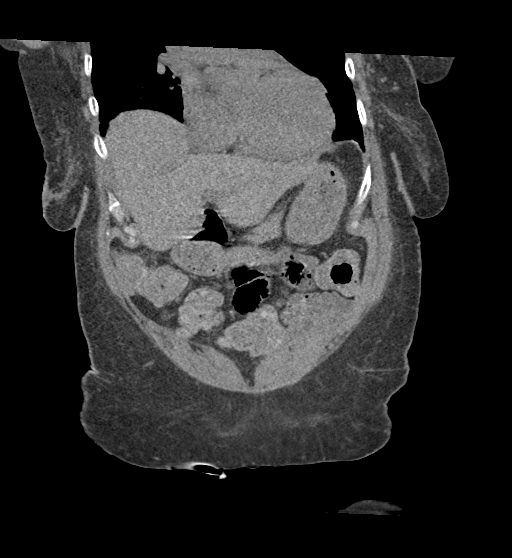
[im 83/187  soft-tissue]
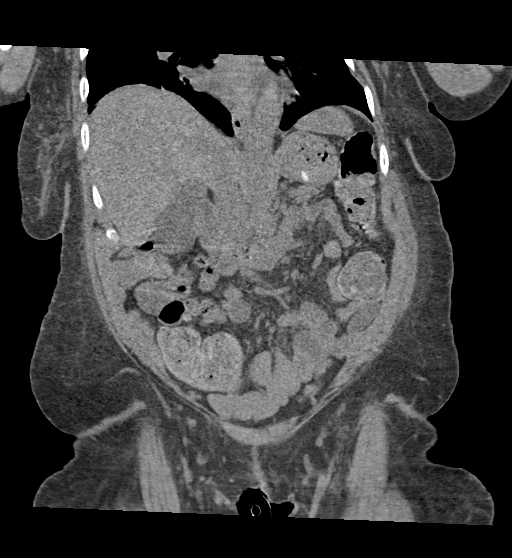
[im 104/187  soft-tissue]
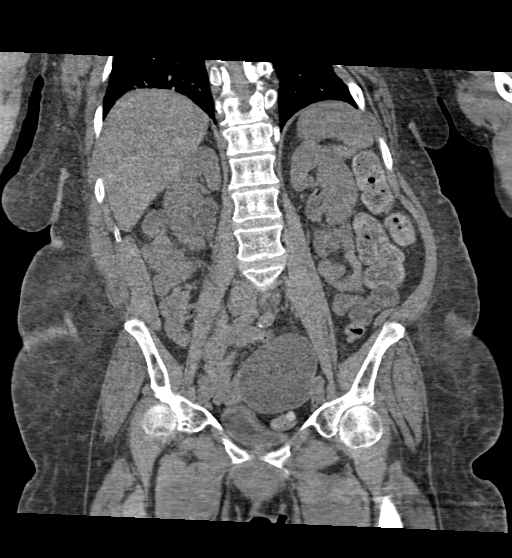

[15 of 46 positions shown; findings below may reference images not displayed]

FINDINGS: Factors affecting image quality: Streak artifact from the patient's
arms in the field limits fine detail particularly in the abdomen.

Lower chest: There is posterior atelectasis.6 increased right middle
lobe atelectasis. No focal infiltrate. Heart is slightly enlarged
with no visible coronary artery disease.

Hepatobiliary: There are tiny stones layering in the inferior
gallbladder but no wall thickening no biliary dilatation. The
unenhanced liver is unremarkable as far as visualized.

Pancreas: Unremarkable without contrast.

Spleen: No focal abnormality or splenomegaly.

Adrenals/Urinary Tract: Stable 1.5 cm left adrenal adenoma. No right
adrenal mass. No focal abnormality in the unenhanced renal cortex.
There is no urinary stone or obstruction. There is a phlebolith in
the right gonadal vein just below the level of the aortic
bifurcation. There is no bladder thickening. There is pelvic floor
laxity with small cystocele.

Stomach/Bowel: No dilatation or wall thickening including of the
appendix. Moderate fecal retention. Left-sided diverticula without
diverticulitis.

Vascular/Lymphatic: Mild aortoiliac atherosclerosis. No adenopathy
is seen.

Reproductive: Status post hysterectomy. No right adnexal
abnormality. Thin walled homogeneous left adnexal cyst measures
x 6.3 by 7.0 cm, previously 7.4 x 5.9 x 6.9 cm. A simple cyst has
been noted on previous ultrasound.

Other: No free air, hemorrhage or fluid. Mild abdominal wall laxity.
No incarcerated hernia.

Musculoskeletal: Osteopenia. There is increased endplate destruction
at L3-4 with further obliteration of the disc space and increased
L3-4 spondylolisthesis and facet joint widening. The findings are
worrisome for spondylodiscitis.

Grade 2 anterolisthesis at this level is noted and severe spinal
canal stenosis at this level is increased with severe foraminal
stenosis. The patient states loss of ambulatory status and this is
probably at least contributory if not the sole cause.
IMPRESSION: 1. Most notably there is increasing endplate destructive change
about L3-4 and increasing obliteration of the disc space with
increased subsidence of the L3 body into L4. Findings are most
likely due to spondylodiscitis.
2. There is a grade 2 L3-4 anterolisthesis with severe worsening of
acquired spinal stenosis at this level, with severe foraminal
stenosis and increased widening of the facet joints.
3. There is no appreciable paraspinal or psoas abscess or focal
fluid collection.
4. Cholelithiasis.
5. Constipation and diverticulosis. There previously was a large
rectal stool ball which has passed in the interval.
6. Persistent large left adnexal cyst with minimal changes since the
last CT.
7. Pelvic floor laxity and cystocele.

## 2024-04-06 IMAGING — MR MR LUMBAR SPINE WO/W CM
5 of 7 series · 28 of 48 positions shown · IV contrast (gadavist)
Comparison: Abdominopelvic CT 04/24/2021, CT cervical spine
07/22/2020, lumbar MRI 12/29/2019 and chest CTA 11/03/2016.

CLINICAL DATA: Myelopathy, chronic, thoracic pain. Low back pain.
Cauda equina syndrome suspected.

EXAM:
MRI THORACIC AND LUMBAR SPINE WITHOUT AND WITH CONTRAST
TECHNIQUE: Multiplanar and multiecho pulse sequences of the thoracic and lumbar
spine were obtained without and with intravenous contrast.
CONTRAST:  7mL GADAVIST GADOBUTROL 1 MMOL/ML IV SOLN

[Series 5: T1 · sagittal · 4.0mm · 0.81mm/px · 4 of 17 slices shown (1 of 2)]
[im 1/17]
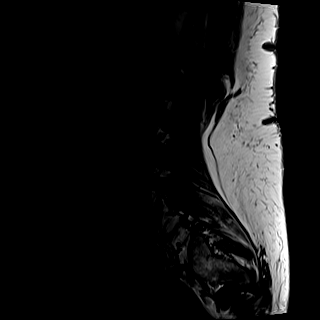
[im 6/17]
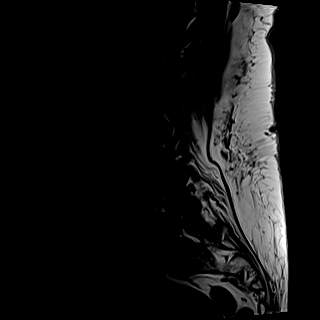
[im 11/17]
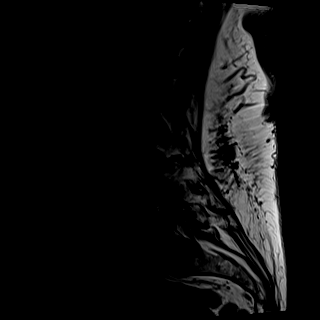
[im 17/17]
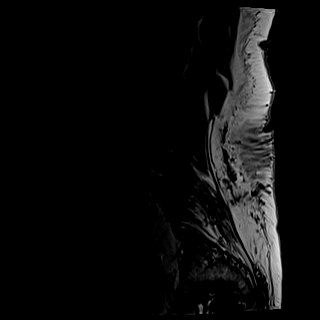

[Series 7: T2 · axial · 4.0mm · 0.62mm/px · z∈[-474,-305]mm · 8 of 35 slices shown]
[im 1/35]
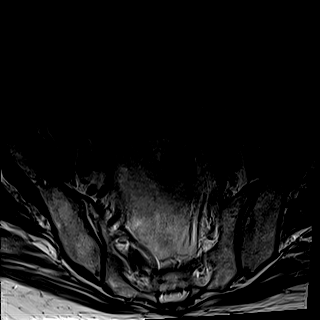
[im 4/35]
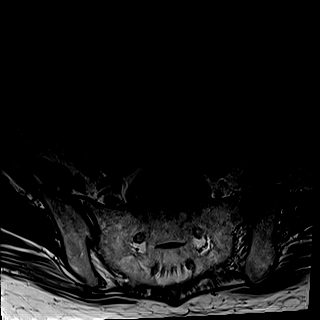
[im 12/35]
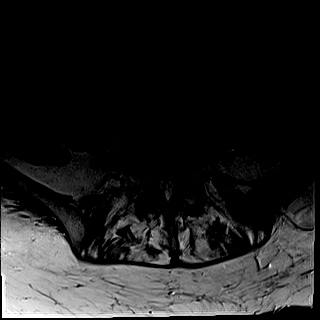
[im 16/35]
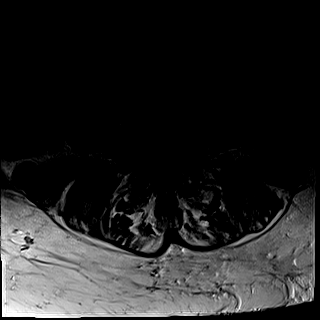
[im 19/35]
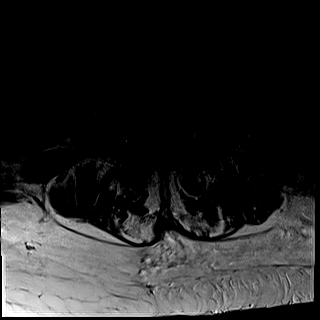
[im 23/35]
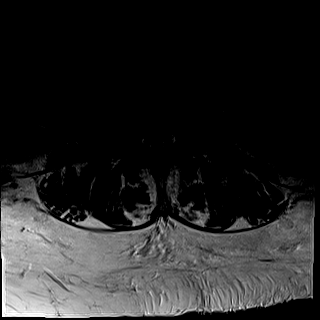
[im 31/35]
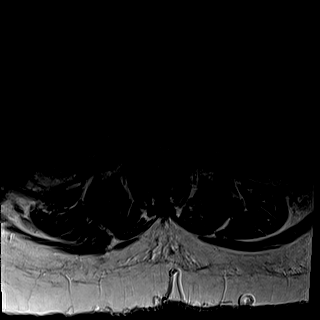
[im 35/35]
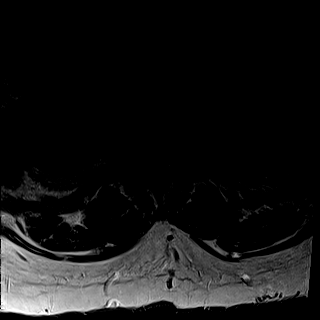

[Series 8: T1 · axial · 4.0mm · 0.39mm/px · z∈[-474,-305]mm · 8 of 35 slices shown (2 of 2)]
[im 1/35]
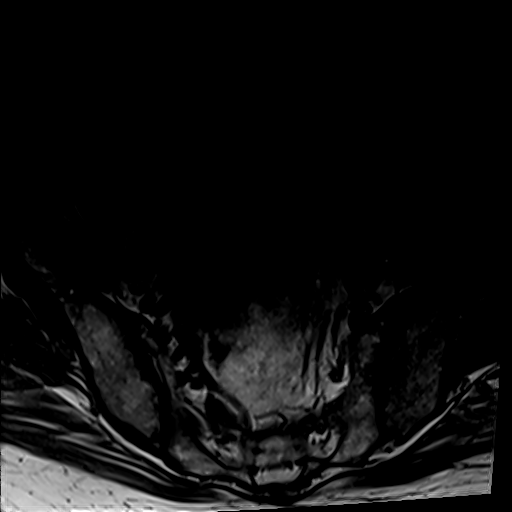
[im 4/35]
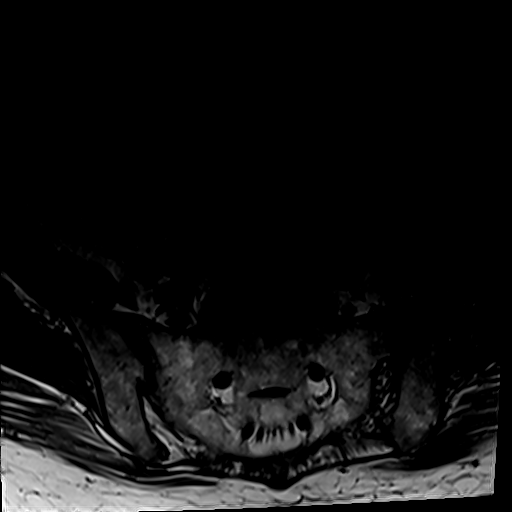
[im 12/35]
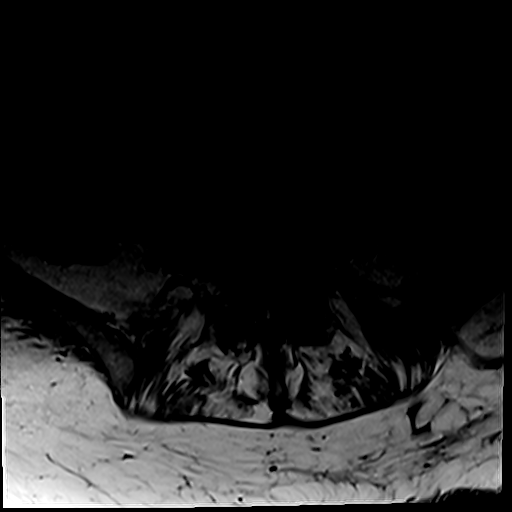
[im 16/35]
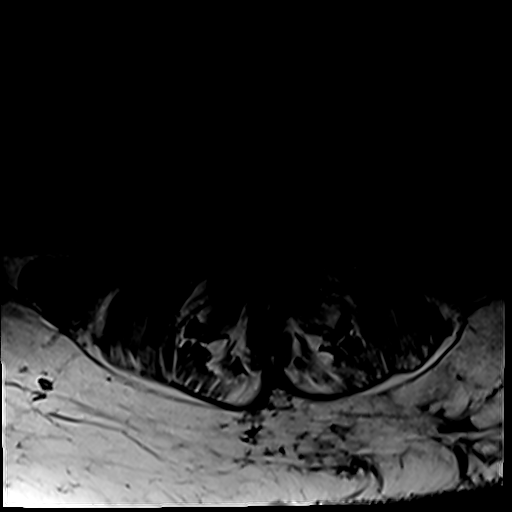
[im 19/35]
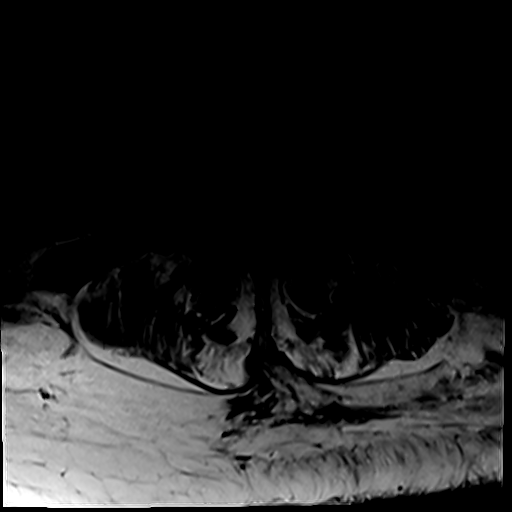
[im 23/35]
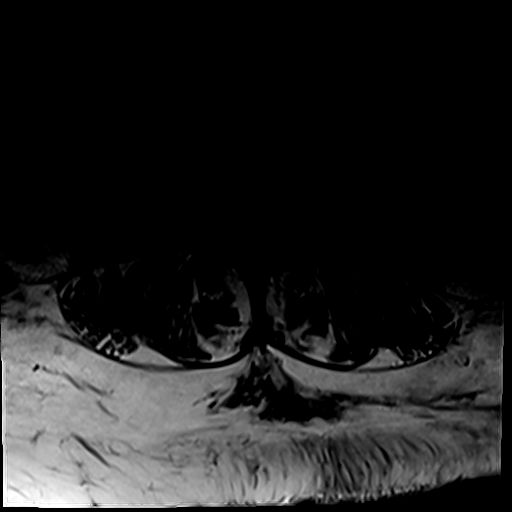
[im 31/35]
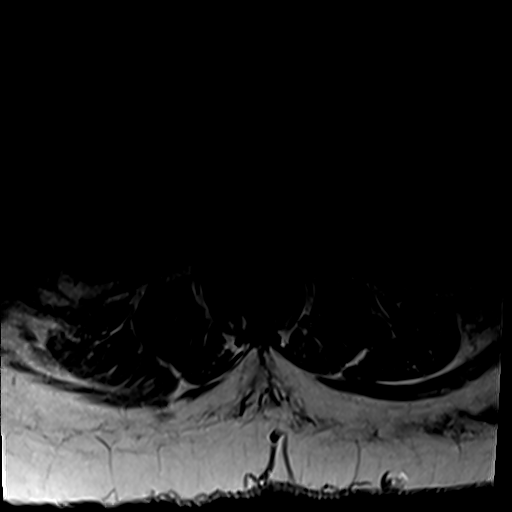
[im 35/35]
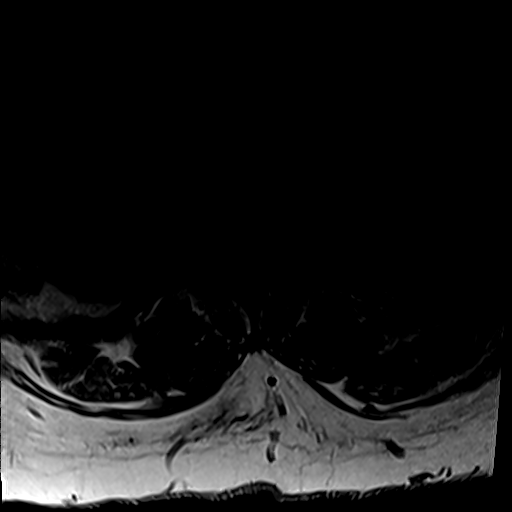

[Series 9: T2 post-contrast · sagittal · 4.0mm · 0.81mm/px · 5 of 17 slices shown]
[im 1/17]
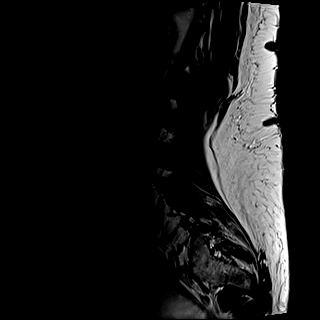
[im 5/17]
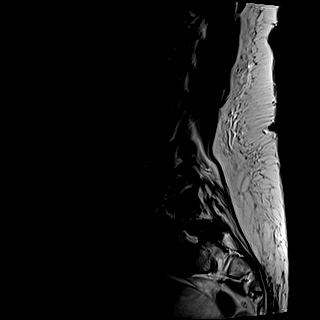
[im 9/17]
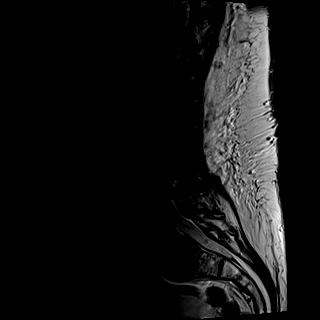
[im 13/17]
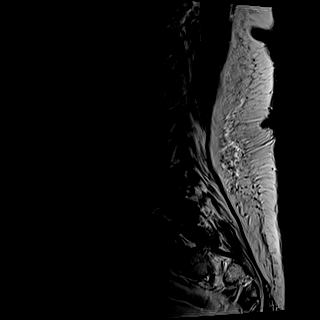
[im 17/17]
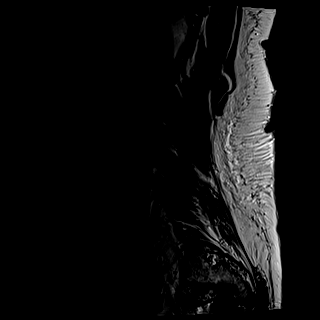

[Series 10: T1 fat-sat post-contrast · sagittal · 4.0mm · 0.81mm/px · 3 of 17 slices shown]
[im 1/17]
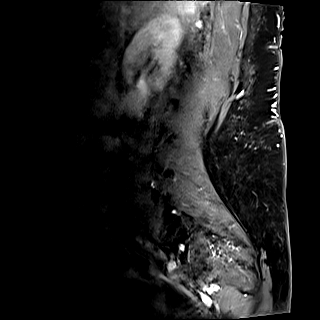
[im 5/17]
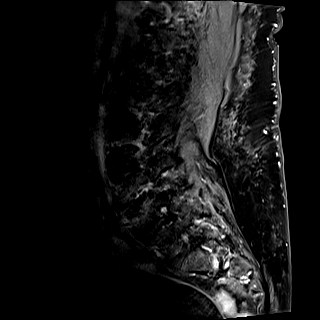
[im 9/17]
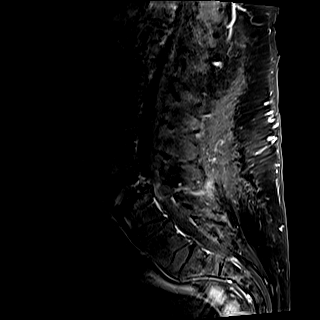

[28 of 48 positions shown; findings below may reference images not displayed]

FINDINGS: Despite efforts by the technologist and patient, moderate to severe
motion artifact is present on today's exam and could not be
eliminated. This reduces exam sensitivity and specificity. Motion is
greatest on the axial images.

MRI THORACIC SPINE FINDINGS

Alignment:  Physiologic.

Vertebrae: No acute or suspicious osseous findings. Serpiginous
lesion in the left aspect of the T11 vertebral body with low T1 and
low T2 signal shows nonspecific enhancement following contrast. This
lesion is unchanged from the 1699 CT, consistent with a benign
finding.

Cord: Cord evaluation limited by motion. No abnormal cord signal or
enhancement identified.

Paraspinal and other soft tissues: No significant paraspinal
findings.

Disc levels:

Multilevel spondylosis with disc bulging and posterior osteophytes.
Details at the individual levels are limited by motion artifact,
although no large disc herniation, cord deformity or high-grade
foraminal narrowing is seen.

MRI LUMBAR SPINE FINDINGS

Segmentation: Transitional lumbosacral anatomy. There is a
transitional, nearly fully lumbarized S1 segment. Concordant with
previous imaging.

Alignment: As seen on recent CT, there is a progressive
anterolisthesis at L4-5 compared with prior abdominal CTs and MRI,
now measuring approximately 5 mm. A mild degenerative
anterolisthesis at L5-S1 is unchanged.

Vertebrae: As seen on CT, there is progressive endplate irregularity
and collapse at L4-5. There is associated mild endplate T2
hyperintensity and enhancement. There is hyperintensity within the
L4-5 disc. There is also fluid and enhancement within the facet
joints bilaterally. No other significant osseous abnormalities. The
visualized sacroiliac joints appear unremarkable.

Conus medullaris: Extends to the L2 level and appears normal.

Paraspinal and other soft tissues: No focal paraspinal fluid
collections are identified. Patient has a known large cystic adnexal
mass which is incompletely visualized by the current study, although
fully seen on CT earlier today and grossly unchanged from previous
lumbar MRI of 12/29/2019.

Disc levels:

Disc space evaluation limited by motion. From T12-L1 through L2-3,
the disc heights are maintained, and there is no disc herniation or
spinal stenosis.

L3-4: Minimal disc bulging. Mild facet and ligamentous hypertrophy.
No significant spinal stenosis or nerve root encroachment.

L4-5: Advanced facet hypertrophy with bilateral facet joint
effusions and synovial enhancement. As above, T2 hyperintensity
within the disc with endplate irregularity, edema and mild
enhancement. There is a broad-based disc extrusion which contributes
to severe spinal stenosis. There is significant narrowing of the
lateral recesses and foramina bilaterally. These findings have
significantly progressed from the previous lumbar MRI.

L5-S1: Chronic bilateral facet hypertrophy with annular disc bulging
and a grade 1 anterolisthesis. Mild spinal stenosis and mild
narrowing of the lateral recesses and left foramen, similar to
previous MRI.

S1-2: Transitional disc space level demonstrates stable mild facet
hypertrophy. No significant spinal stenosis or nerve root
encroachment.
IMPRESSION: 1. Study is degraded by moderate to severe motion artifact.
Intraspinal detail is limited.
2. No acute findings or high-grade spinal stenosis demonstrated in
the thoracic spine. There is mild multilevel spondylosis.
3. As demonstrated on recent CTs, progressive anterolisthesis at
L4-5 with discal hyperintensity, endplate irregularity, edema and
low level enhancement. Findings could be secondary to chronic
discitis/osteomyelitis or instability. No paraspinal fluid
collections are identified. There is resulting severe multifactorial
spinal stenosis with significant lateral recess and foraminal
narrowing bilaterally, likely contributing to cauda equina syndrome.
4. Stable mild spinal stenosis and mild lateral recess and left
foraminal narrowing at L5-S1.
5. Transitional lumbosacral anatomy.

## 2024-04-15 IMAGING — CR DG CHEST 2V
2 series · 2 of 2 positions shown · non-contrast
Comparison: 03/18/2021

CLINICAL DATA: Dyspnea

EXAM:
CHEST - 2 VIEW

[chest lat]
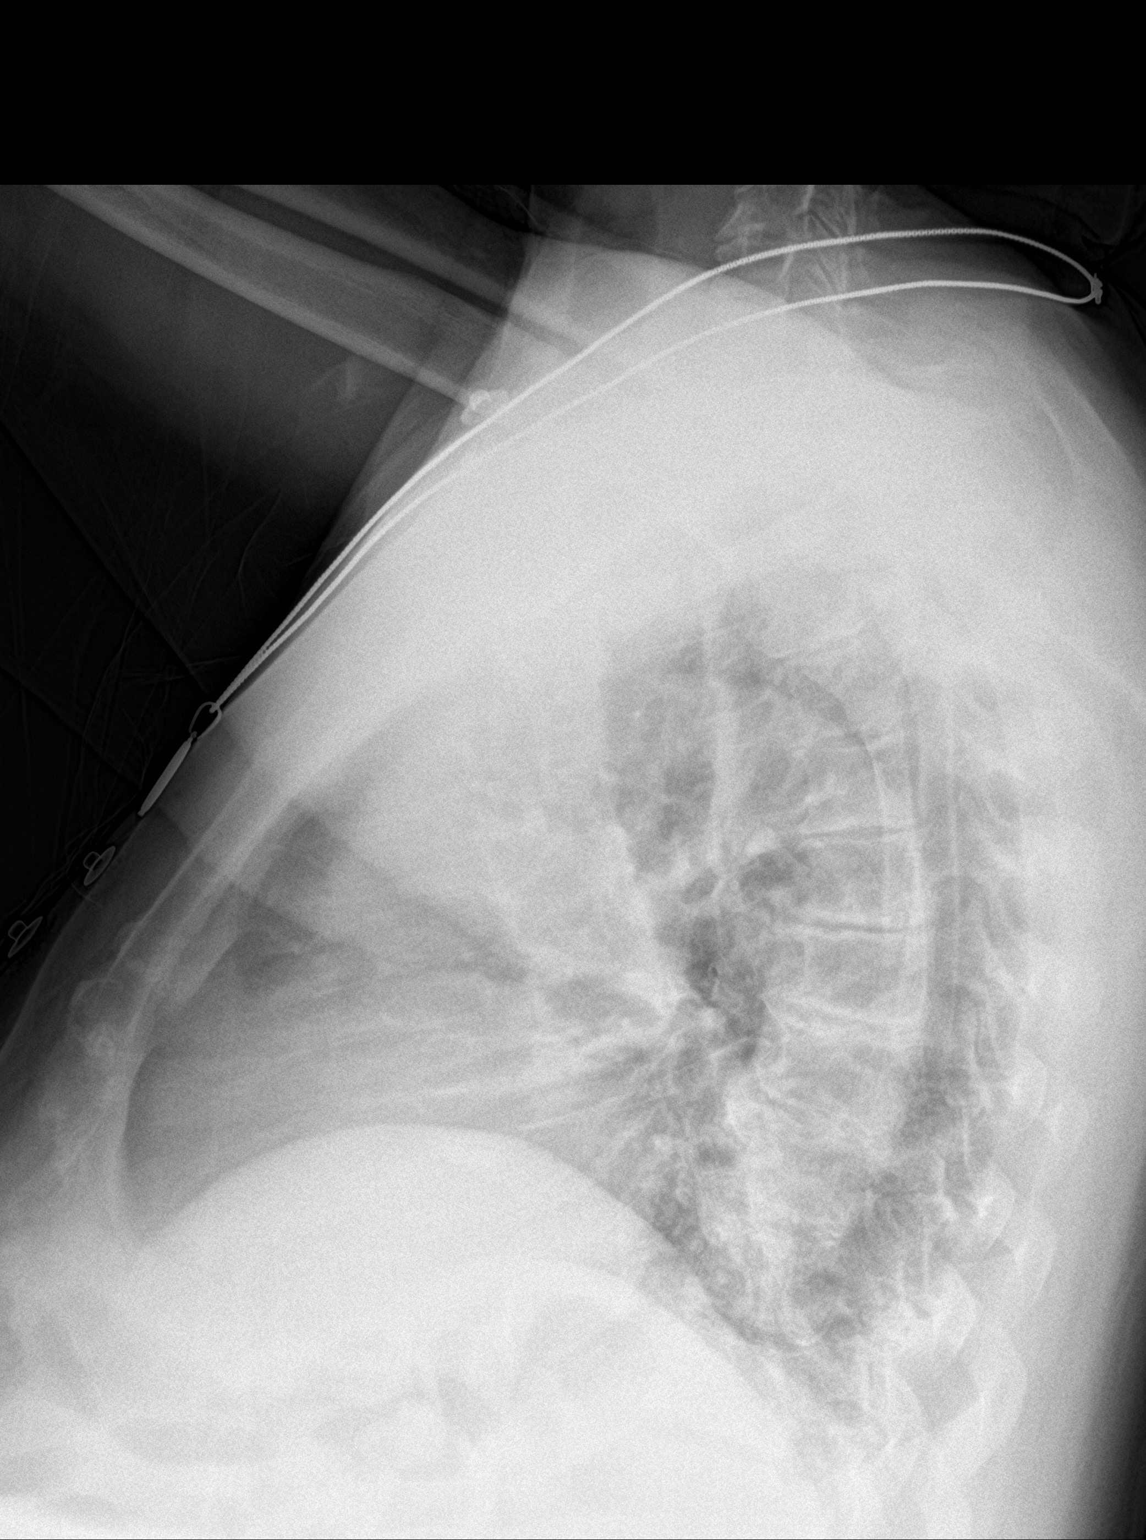

[chest ap]
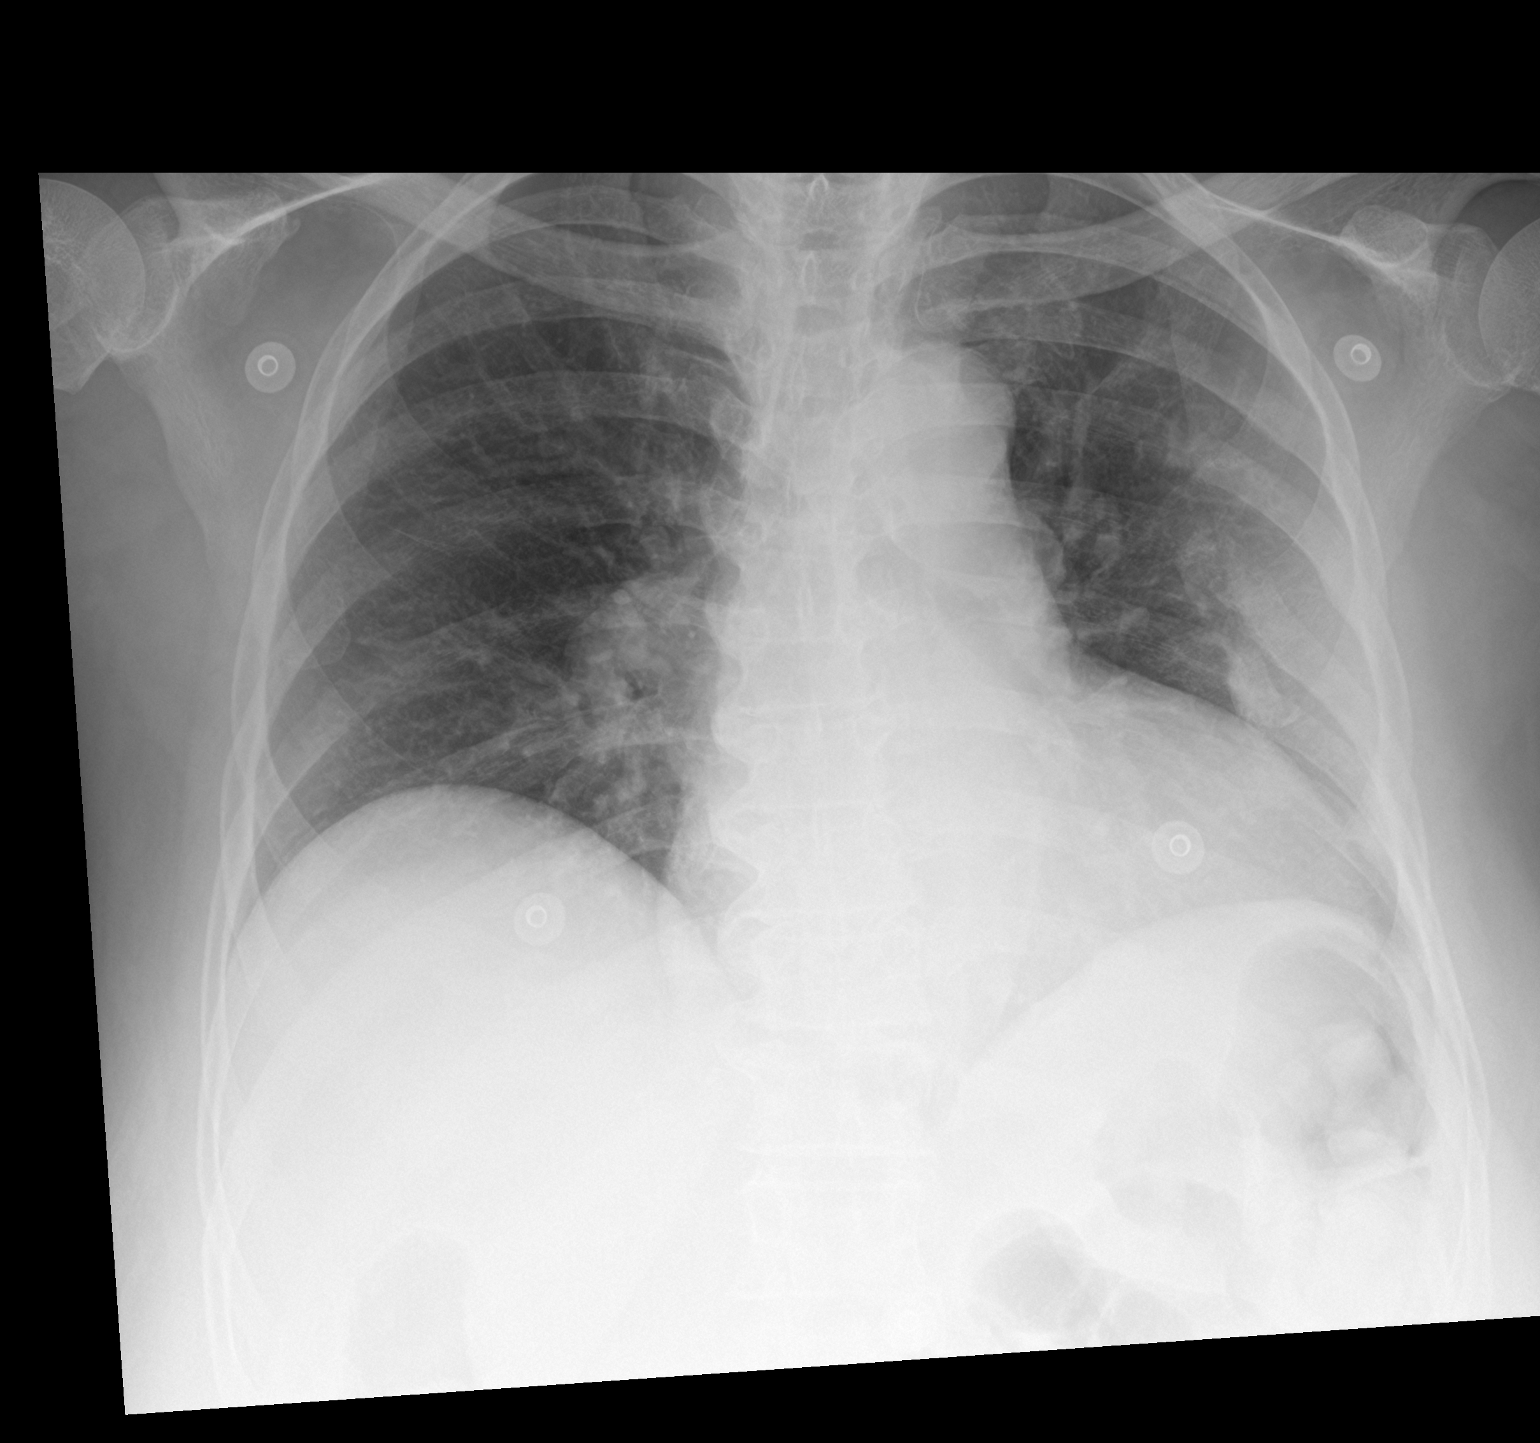

[2 of 2 positions shown; findings below may reference images not displayed]

FINDINGS: Lungs are clear. No pneumothorax or pleural effusion. Cardiac size
within normal limits. Pulmonary vascularity is normal. Multiple
healed left rib fractures are again identified.
IMPRESSION: No radiographic evidence of acute cardiopulmonary disease

## 2024-05-02 IMAGING — CT CT ABD-PELV W/O CM
2 of 4 series · 16 of 46 positions shown, 18 images · non-contrast
Comparison: 04/24/2021

CLINICAL DATA: Abdominal pain



[Series 2: axial st · axial · 0.68mm/px · z∈[-401,-21]mm · 13 of 84 slices shown, 15 images]
[im 4/84  soft-tissue]
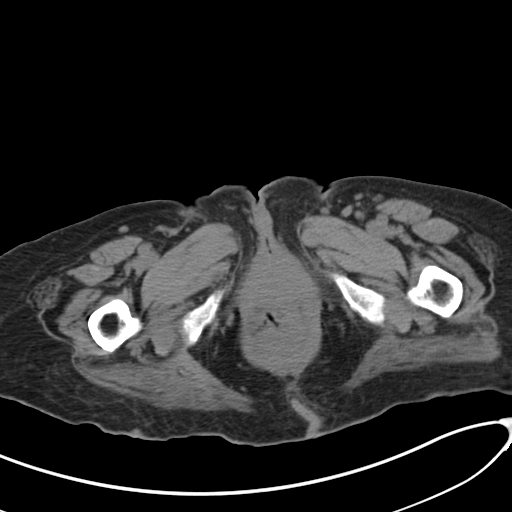
[im 4/84  bone]
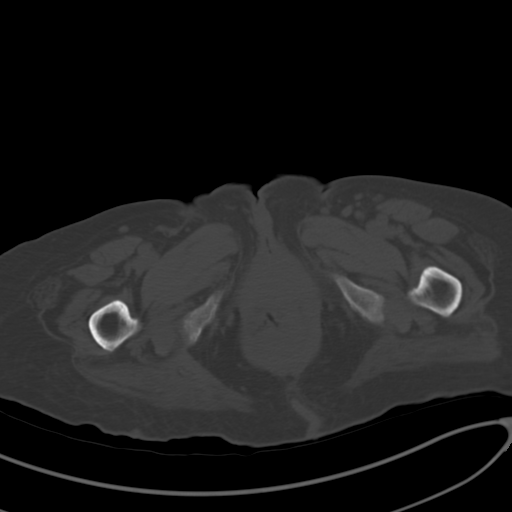
[im 12/84  soft-tissue]
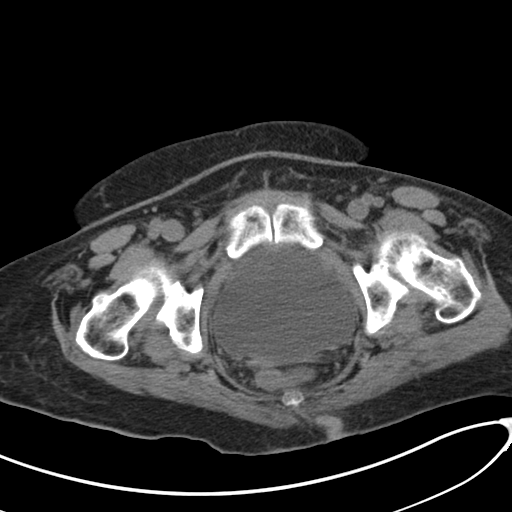
[im 16/84  soft-tissue]
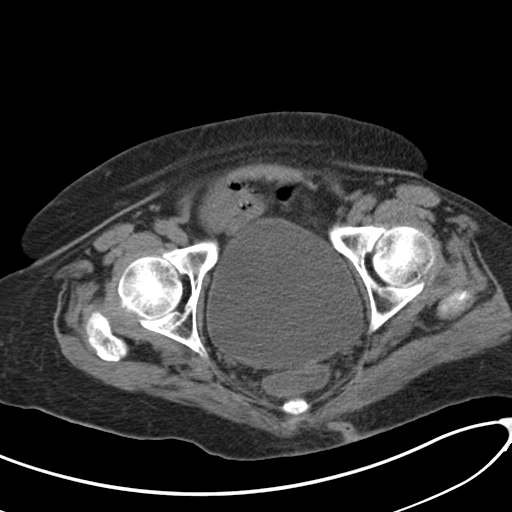
[im 24/84  soft-tissue]
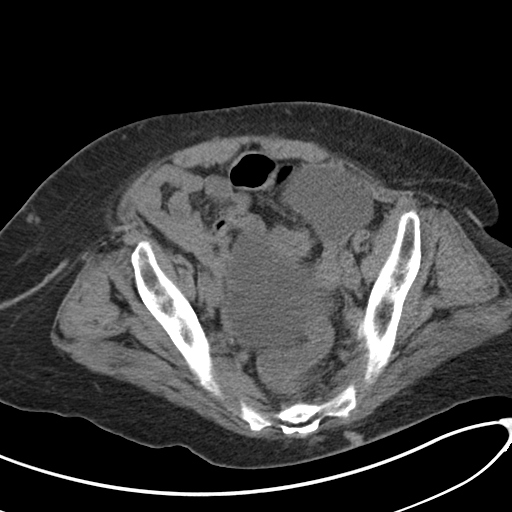
[im 28/84  soft-tissue]
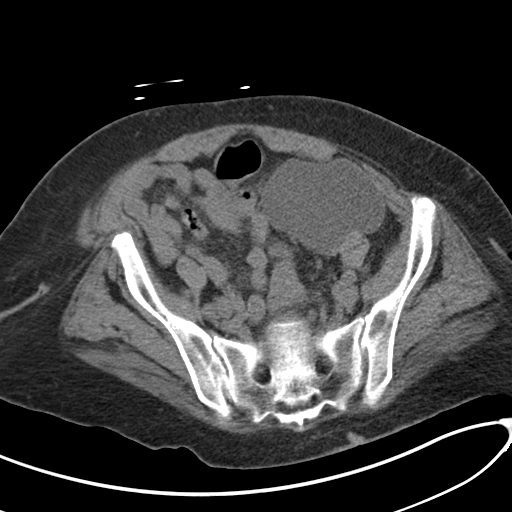
[im 36/84  soft-tissue]
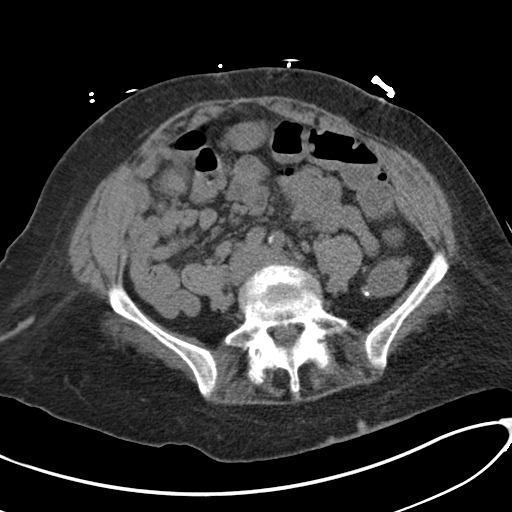
[im 44/84  soft-tissue]
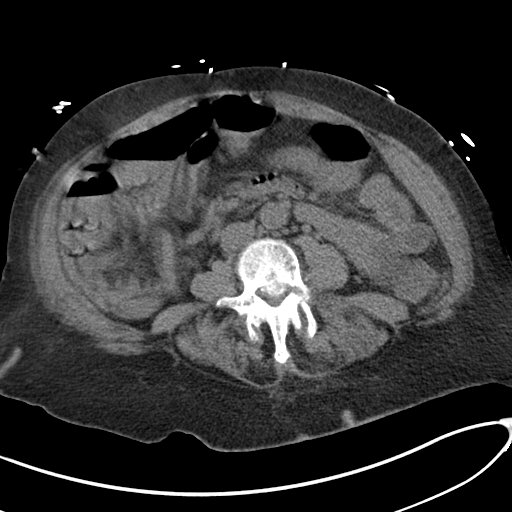
[im 48/84  soft-tissue]
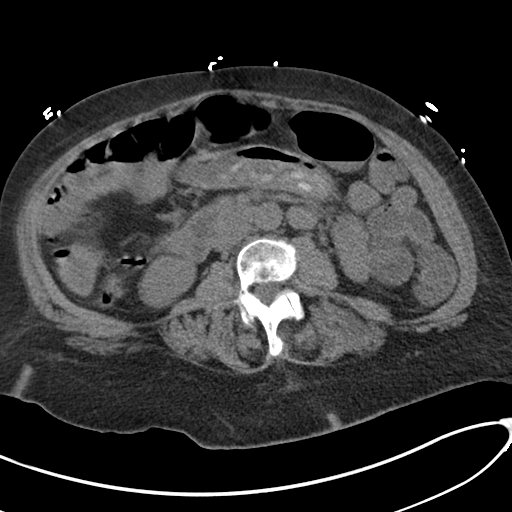
[im 56/84  soft-tissue]
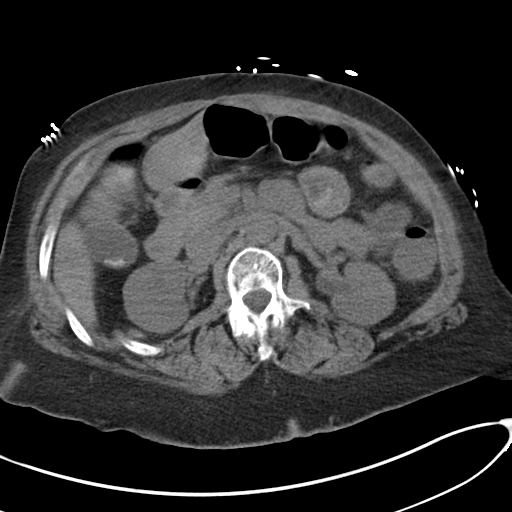
[im 56/84  bone]
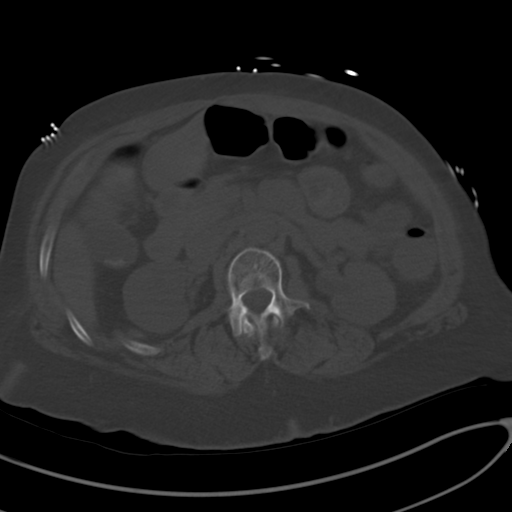
[im 60/84  soft-tissue]
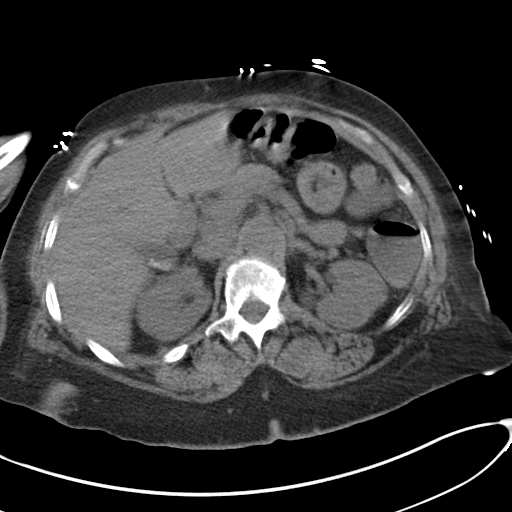
[im 68/84  soft-tissue]
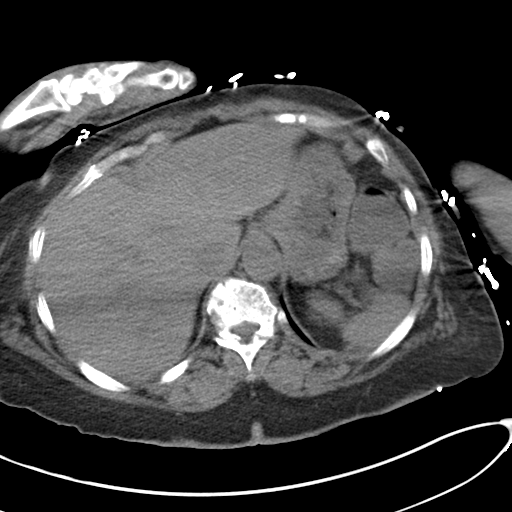
[im 72/84  soft-tissue]
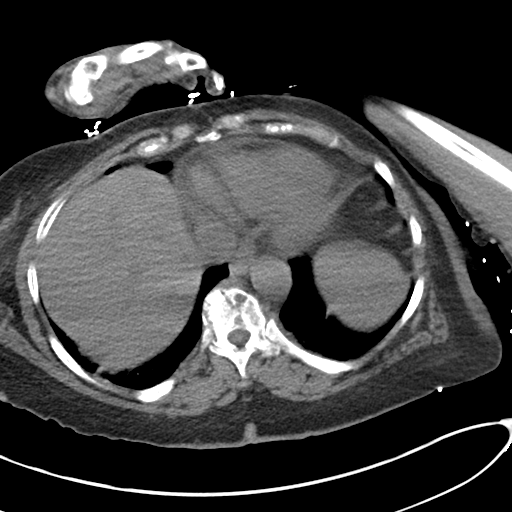
[im 80/84  soft-tissue]
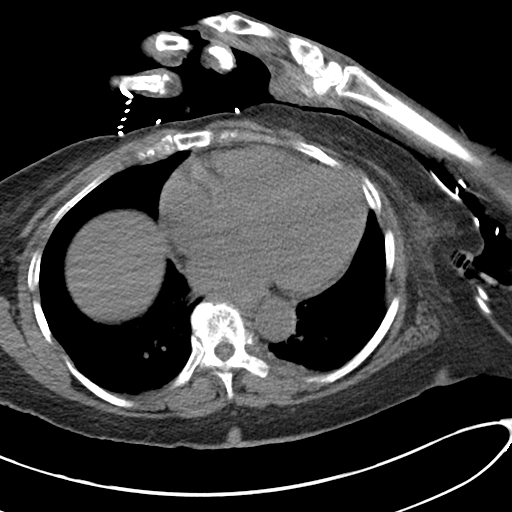

[Series 4: coronal st · coronal · 0.73mm/px · 3 of 128 slices shown]
[im 43/128  soft-tissue]
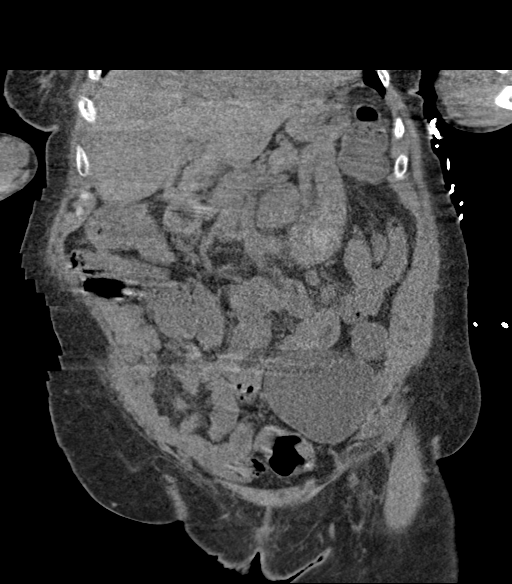
[im 57/128  soft-tissue]
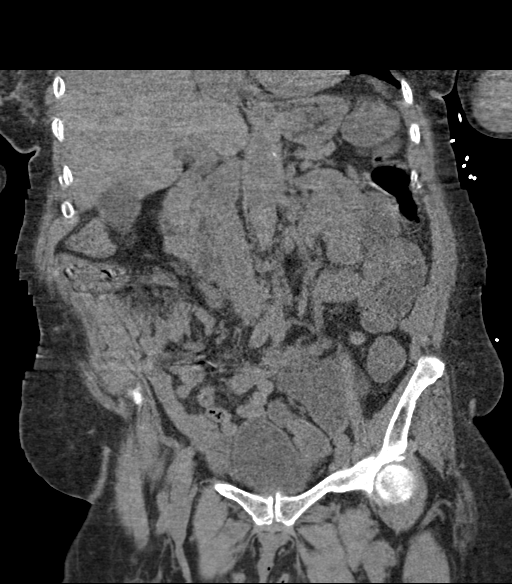
[im 71/128  soft-tissue]
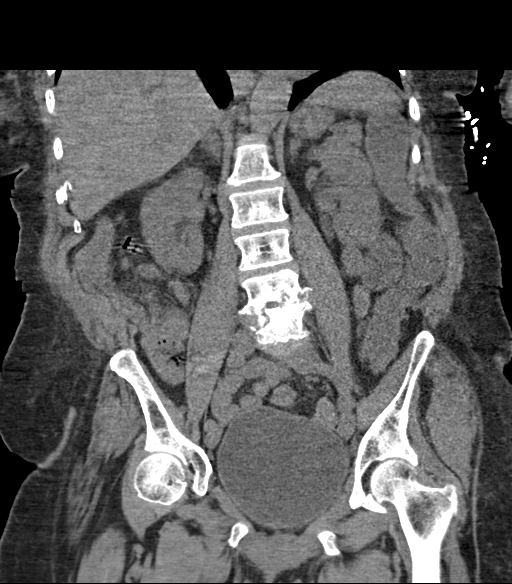

[16 of 46 positions shown; findings below may reference images not displayed]

FINDINGS: Lower Chest: Bibasilar atelectasis

Hepatobiliary: Normal hepatic contours. No intra- or extrahepatic
biliary dilatation. There is cholelithiasis without acute
inflammation.

Pancreas: Normal pancreas. No ductal dilatation or peripancreatic
fluid collection.

Spleen: Normal.

Adrenals/Urinary Tract: The adrenal glands are normal. No
hydronephrosis, nephroureterolithiasis or solid renal mass. The
urinary bladder is normal for degree of distention

Stomach/Bowel: There is no hiatal hernia. Normal duodenal course and
caliber. No small bowel dilatation or inflammation. No focal colonic
abnormality. Normal appendix.

Vascular/Lymphatic: There is calcific atherosclerosis of the
abdominal aorta. No lymphadenopathy.

Reproductive: Left lower quadrant cystic structure measures 8.2 x
6.2 cm, previously 6.9 x 6.3 cm.

Other: None.

Musculoskeletal: Progressive endplate destruction at L3-4,
particularly of the posteroinferior corner of L3. There is at least
mild spinal canal stenosis at this level. No paraspinal abscess.
IMPRESSION: 1. Progressive endplate destruction at L3-4, particularly of the
posteroinferior corner of L3, consistent with chronic
discitis/osteomyelitis.
2. Slight increase in size of left lower quadrant cystic structure.
3. Cholelithiasis without acute inflammation.

Aortic Atherosclerosis ([id number]).

## 2024-05-21 IMAGING — DX DG KNEE COMPLETE 4+V*R*
4 series · 5 of 5 positions shown · non-contrast
Comparison: None Available.

CLINICAL DATA: Knee pain

EXAM:
RIGHT KNEE - COMPLETE 4+ VIEW

[Series 1: knee ap · 0.14mm/px · 2 of 2 slices shown]
[im 1/2]
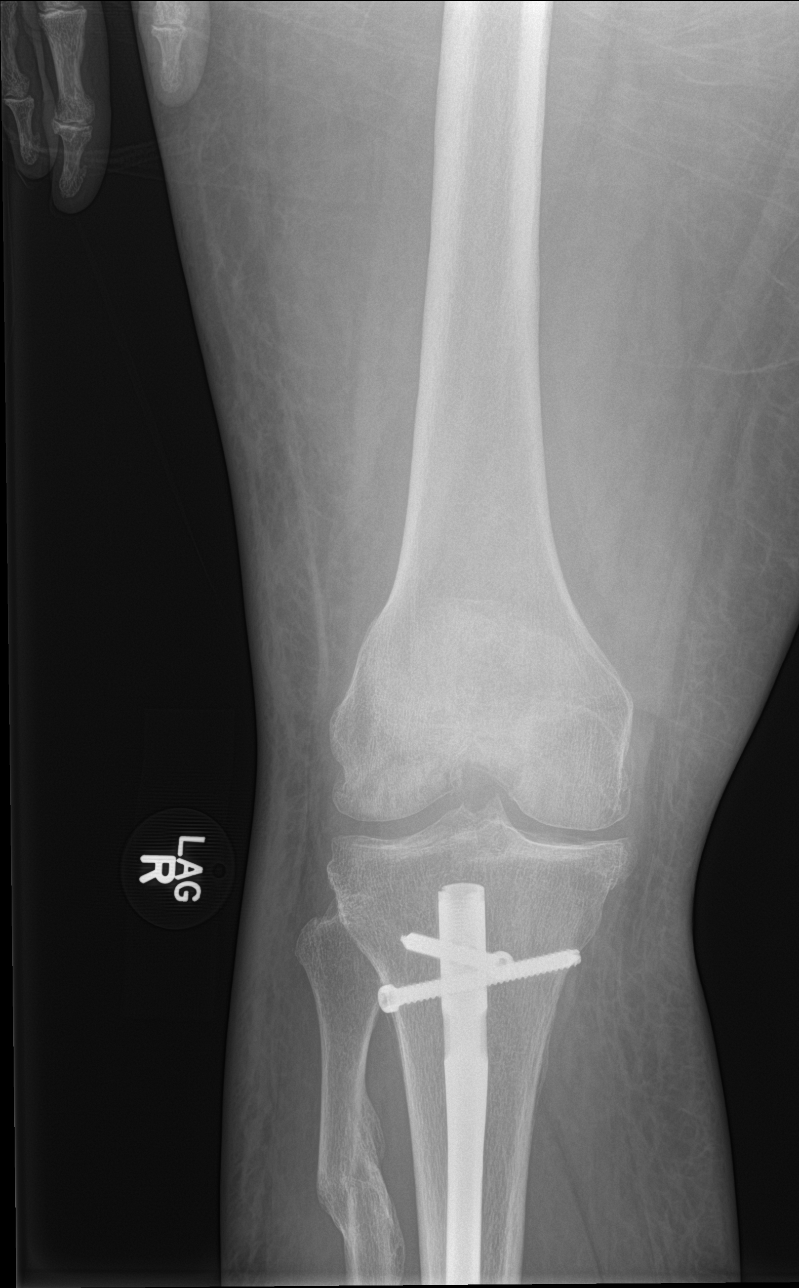
[im 2/2]
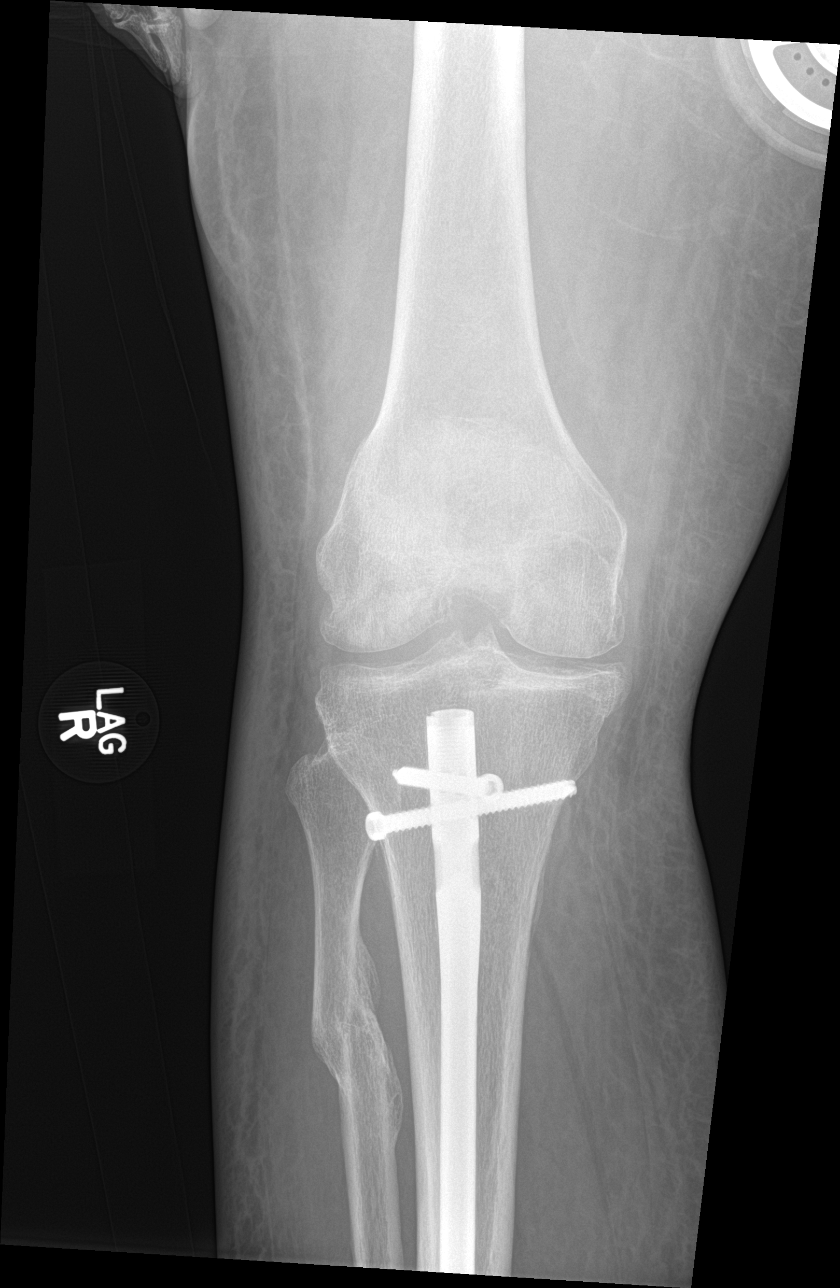

[knee tunnel]
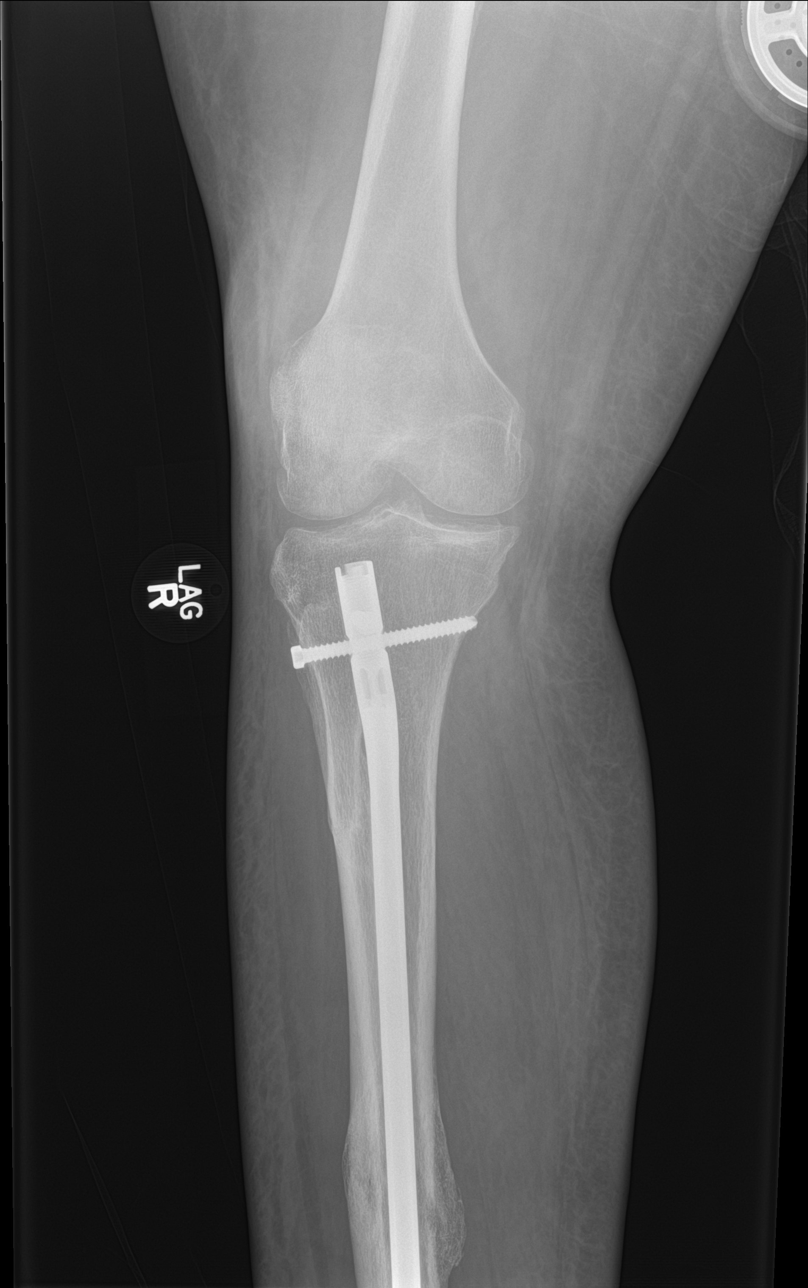

[knee lat]
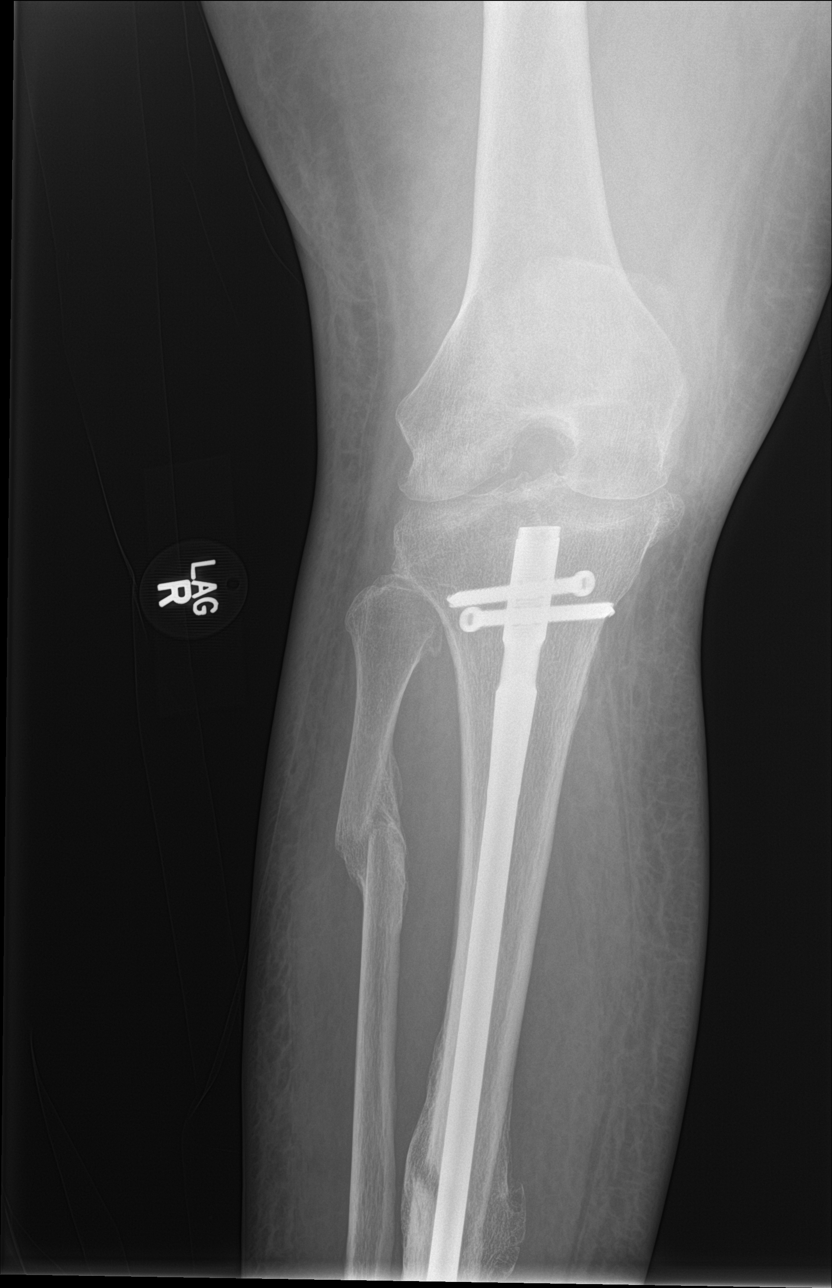

[knee obl]
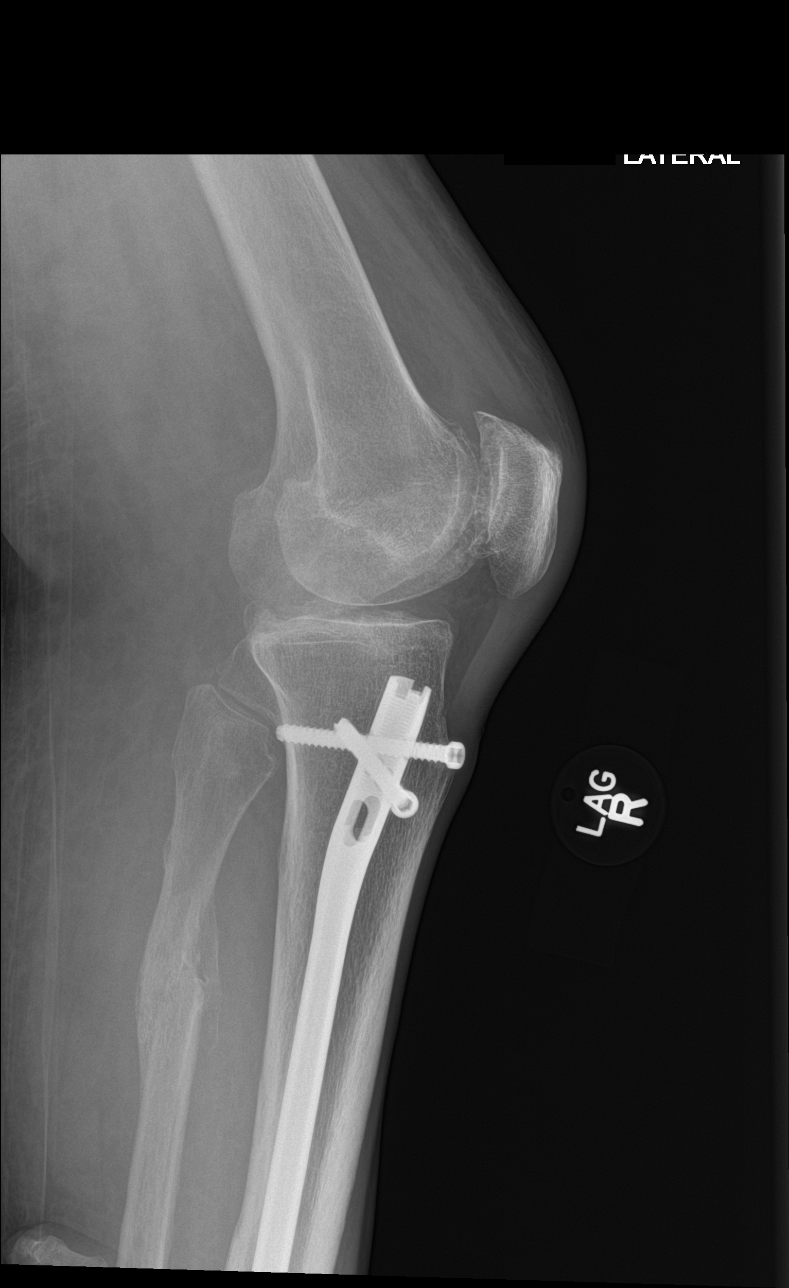

[5 of 5 positions shown; findings below may reference images not displayed]

FINDINGS: Remote tibia and fibula fractures which are healed with tibial nail.
Degenerative spurring at the knee joint. Subcutaneous reticulation
which is generalized and nonspecific. No opaque foreign body or soft
tissue gas. No evidence of fracture or subluxation.
IMPRESSION: 1. Nonspecific generalized subcutaneous edema.
2. Remote and healed tibial and fibular shaft fractures.
3. Knee osteoarthritis.

## 2024-05-21 IMAGING — DX DG FOOT COMPLETE 3+V*L*
4 series · 4 of 4 positions shown · non-contrast
Comparison: 10/15/2020

CLINICAL DATA: Knee pain and swelling

EXAM:
LEFT FOOT - COMPLETE 3+ VIEW

[foot ap (1 of 2)]
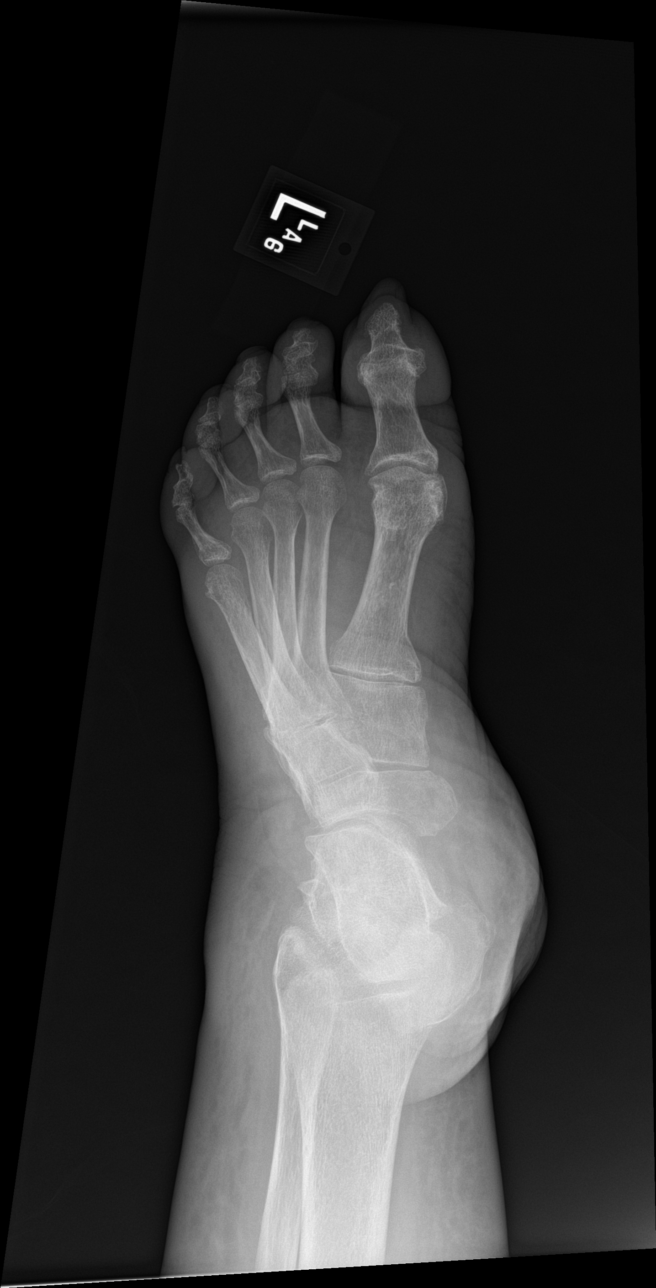

[foot obl]
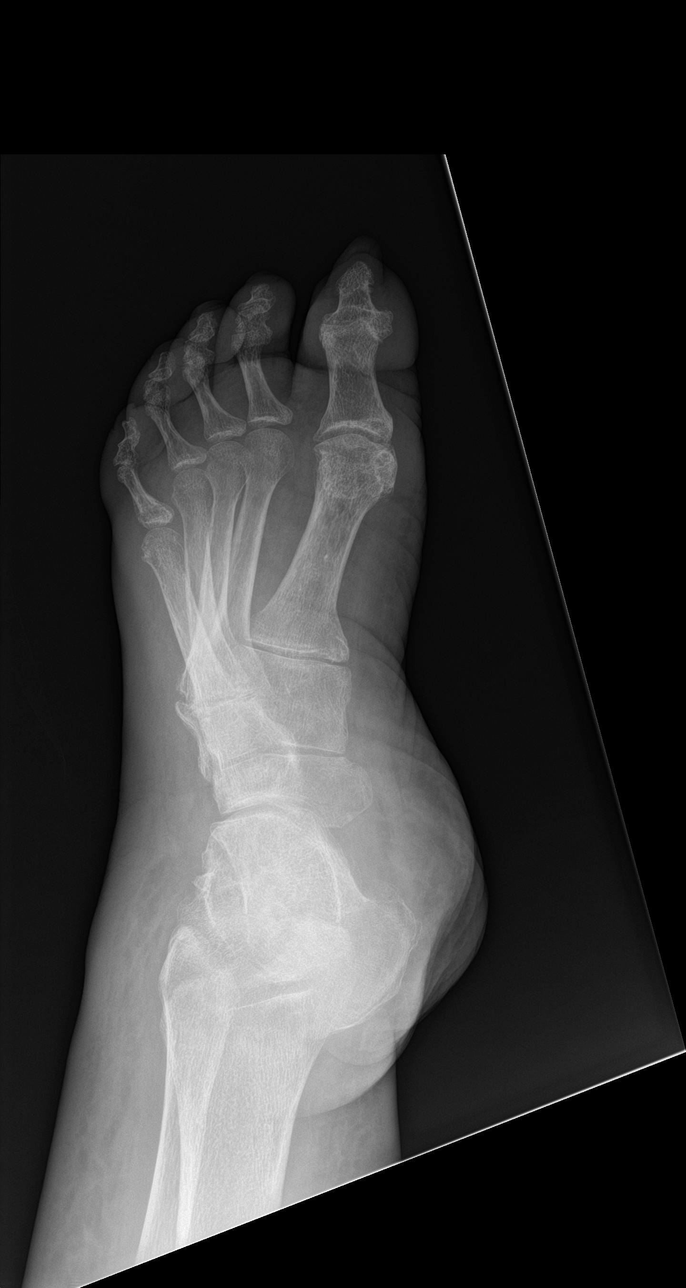

[foot lat]
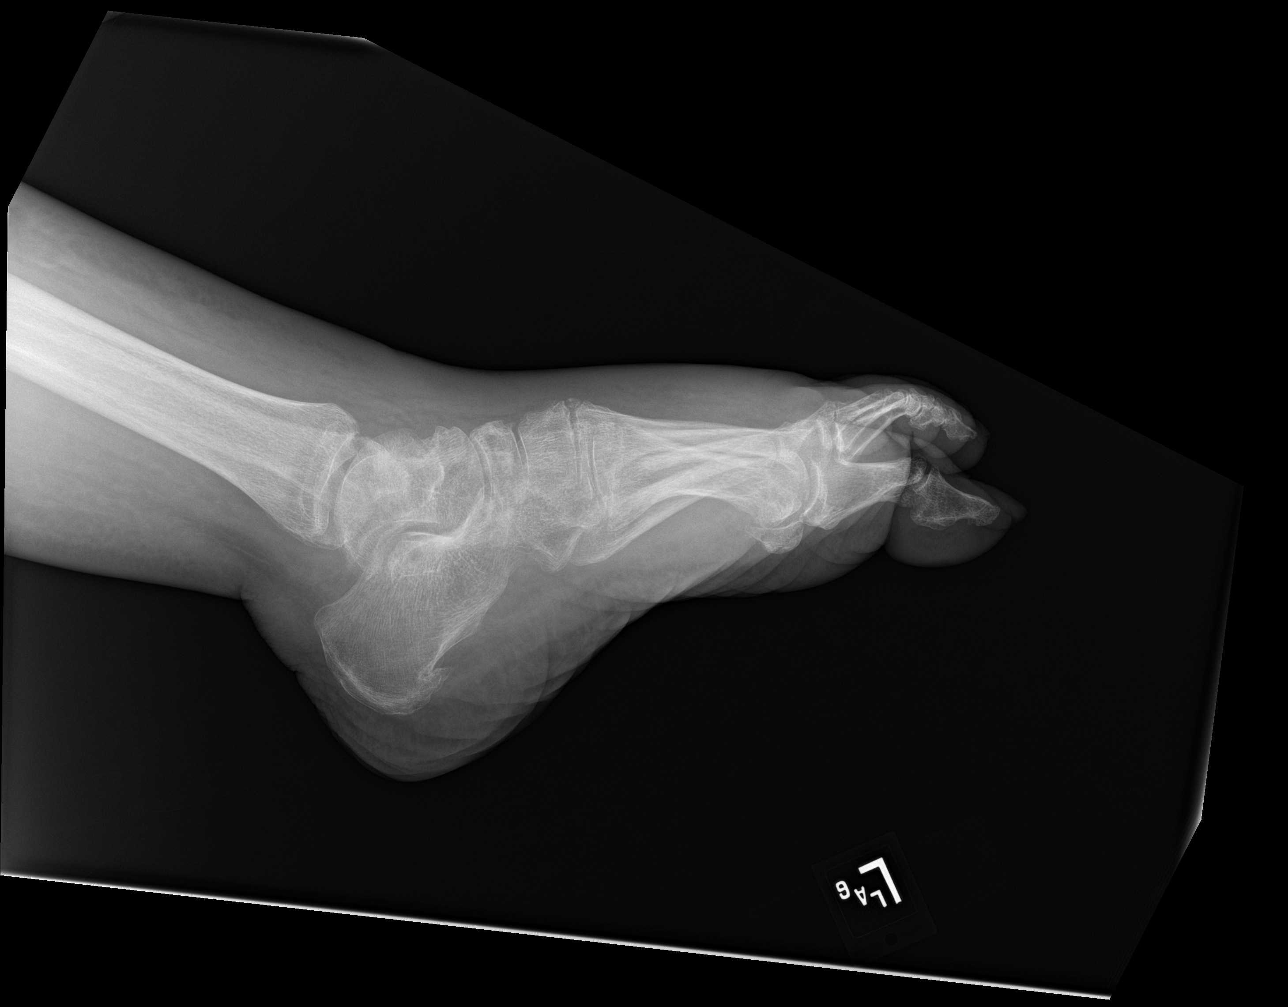

[foot ap (2 of 2)]
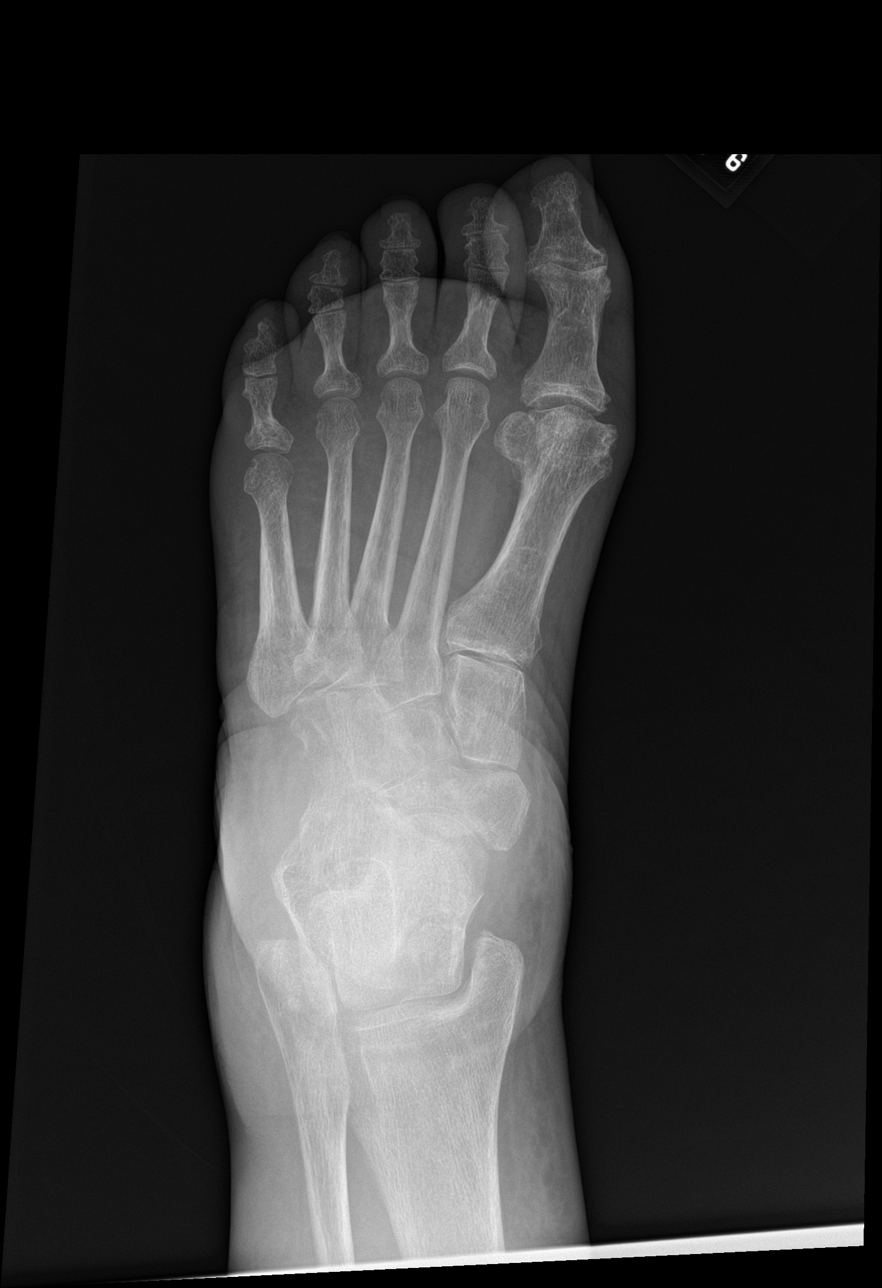

[4 of 4 positions shown; findings below may reference images not displayed]

FINDINGS: Nonspecific subcutaneous swelling which is generalized. Degenerative
midfoot and first MTP spurring. No evidence of fracture or erosion.
IMPRESSION: Nonspecific soft tissue swelling. No acute osseous finding or soft
tissue emphysema.

## 2024-05-26 IMAGING — DX DG PORTABLE PELVIS
1 series · 1 of 1 positions shown · non-contrast
Comparison: CT abdomen and pelvis 05/20/2021

CLINICAL DATA: Pain.

EXAM:
PORTABLE PELVIS 1-2 VIEWS

[pelvis ap]
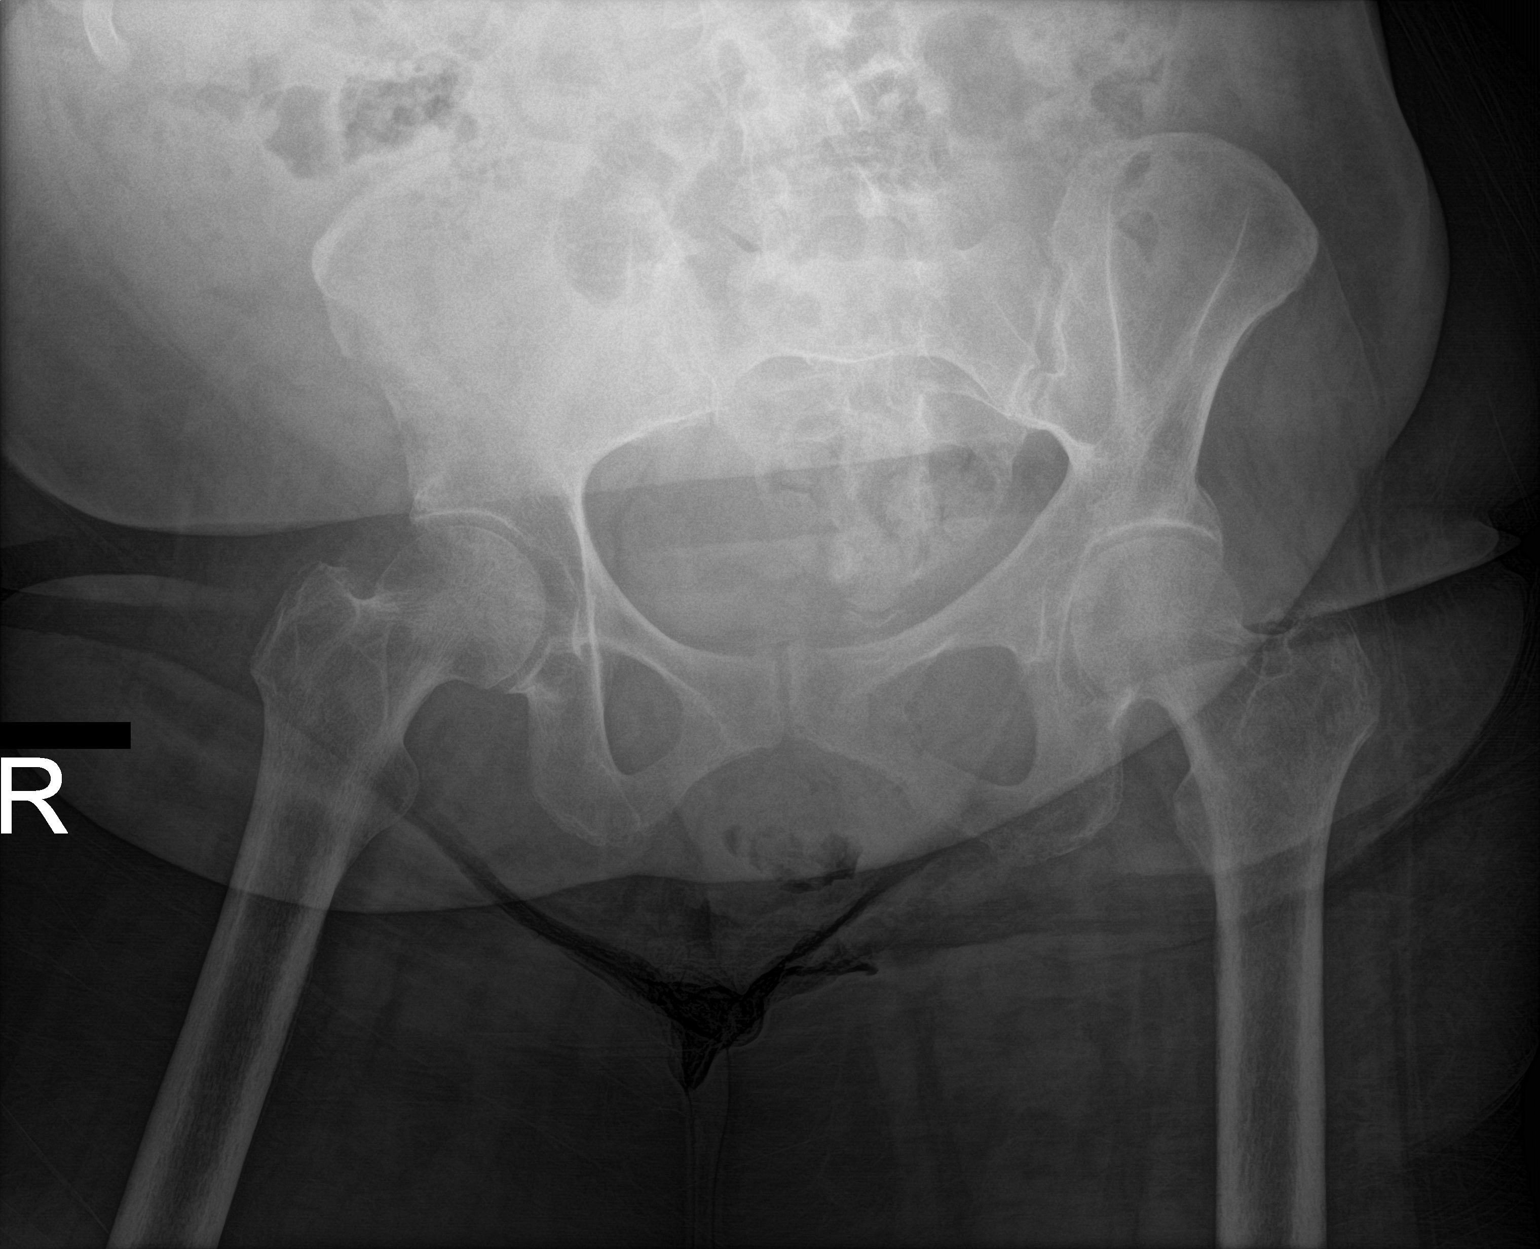

[1 of 1 positions shown; findings below may reference images not displayed]

FINDINGS: There is no evidence of pelvic fracture or diastasis. No pelvic bone
lesions are seen. Bones are osteopenic. There are mild degenerative
changes of the hips. Soft tissues are within normal limits.
IMPRESSION: 1. No acute bony abnormality.
2. Mild degenerative changes of the hips.
3. Diffuse osteopenia.

## 2024-05-29 IMAGING — CT CT HEAD W/O CM
4 series · 16 of 47 positions shown, 18 images · non-contrast
Comparison: 08/08/2020

CLINICAL DATA: Head trauma.  Altered mental status.



[Series 2: head bone · axial · 0.43mm/px · z∈[+1413,+1445]mm · 3 of 80 slices shown]
[im 8/80  bone]
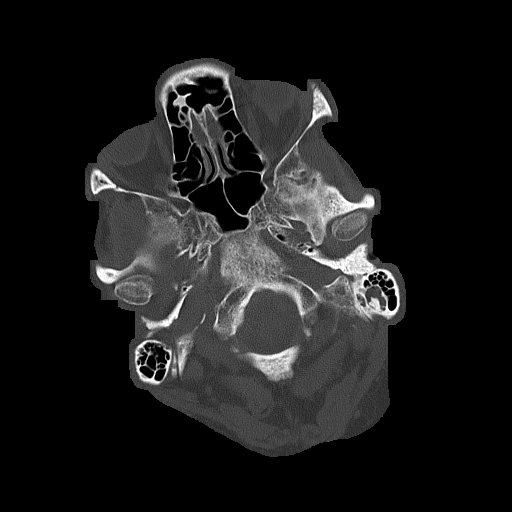
[im 16/80  bone]
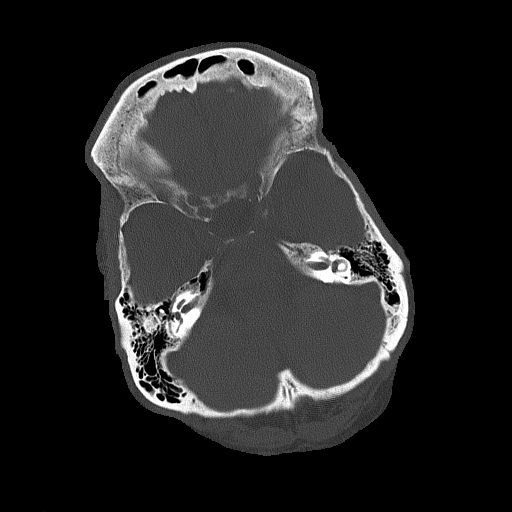
[im 24/80  bone]
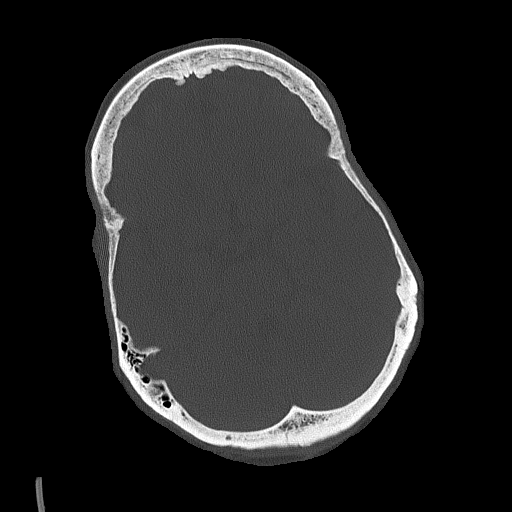

[Series 3: head wo · axial · 0.43mm/px · z∈[+1414,+1534]mm · 7 of 32 slices shown, 9 images]
[im 4/32  brain]
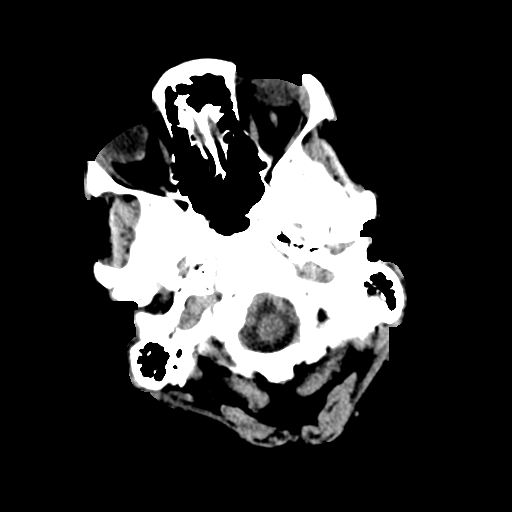
[im 4/32  bone]
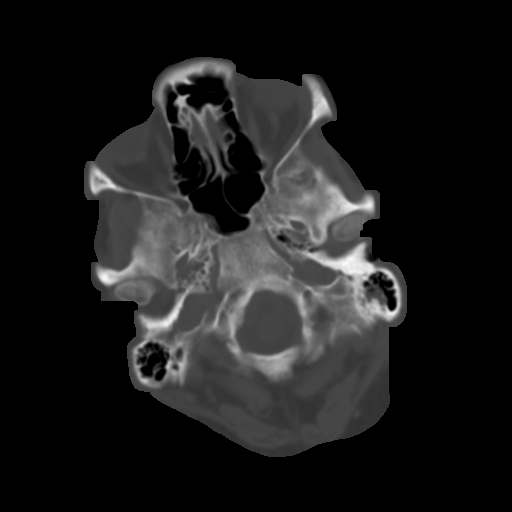
[im 8/32  brain]
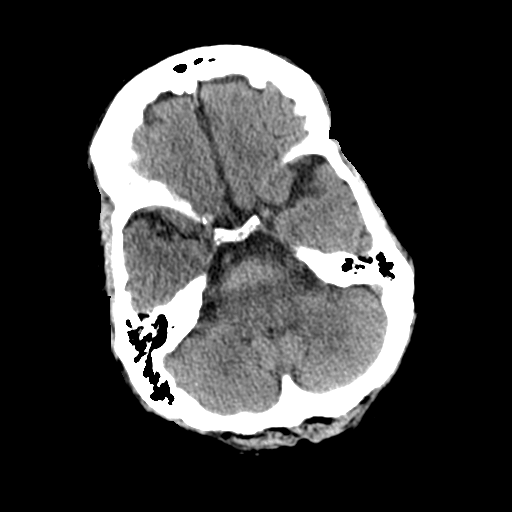
[im 12/32  brain]
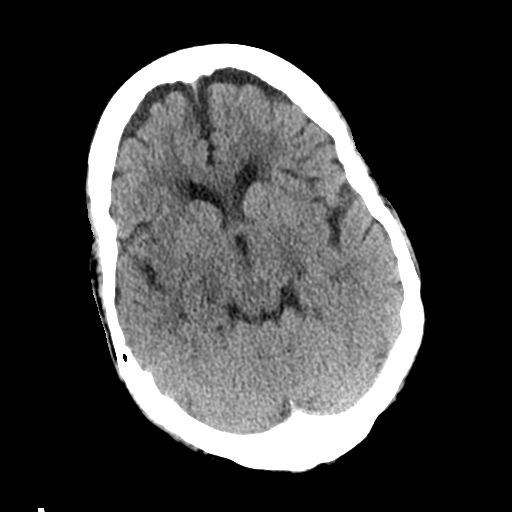
[im 16/32  brain]
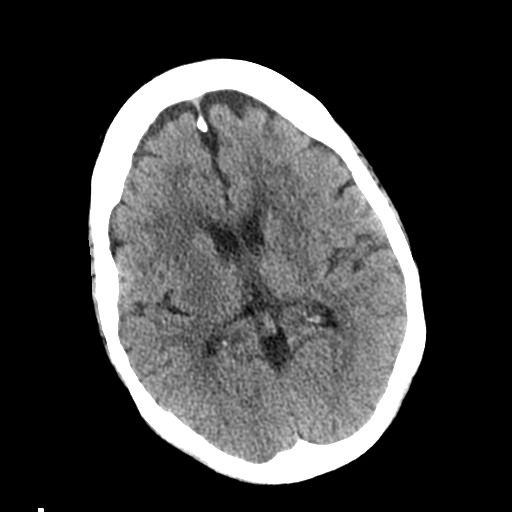
[im 20/32  brain]
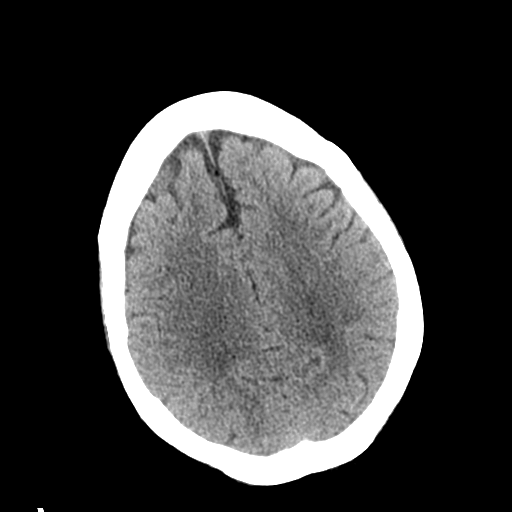
[im 20/32  bone]
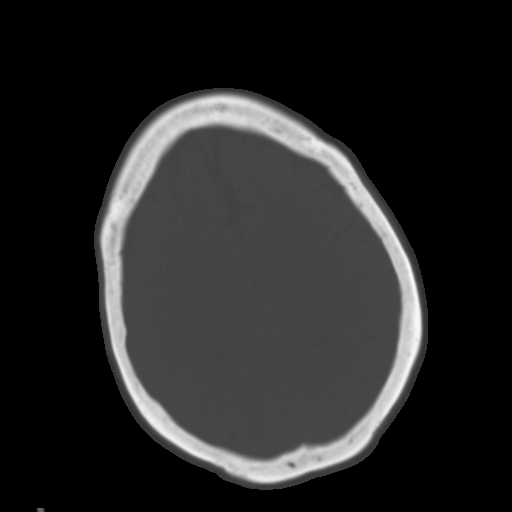
[im 24/32  brain]
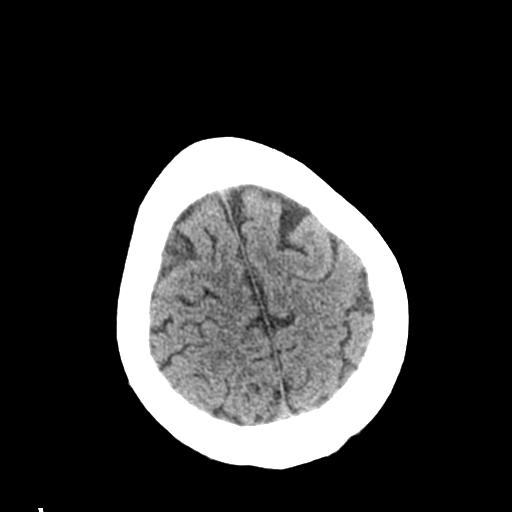
[im 28/32  brain]
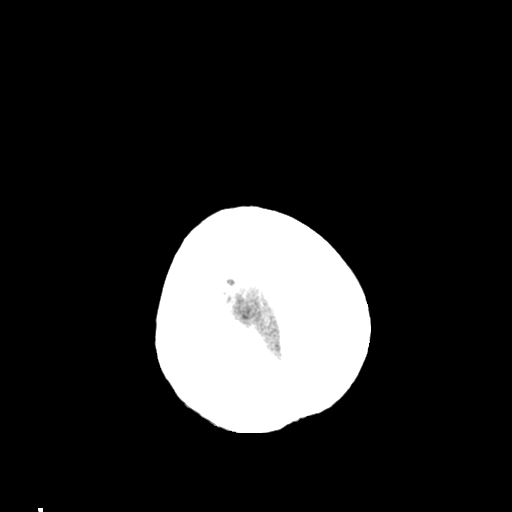

[Series 5: coronal soft tissue · coronal · 0.31mm/px · 3 of 68 slices shown]
[im 23/68  brain]
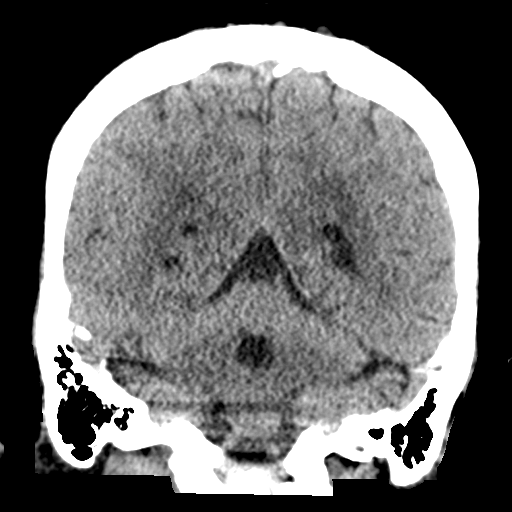
[im 30/68  brain]
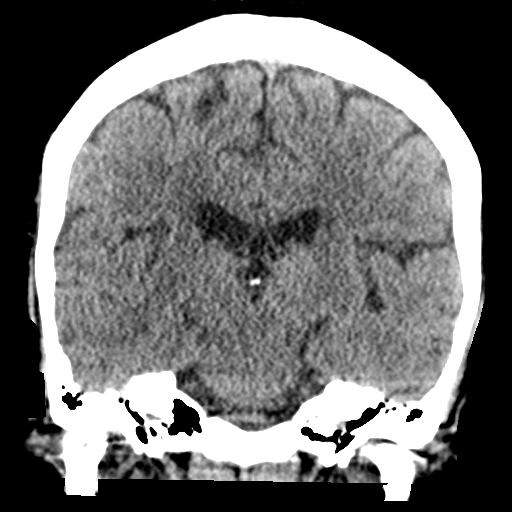
[im 38/68  brain]
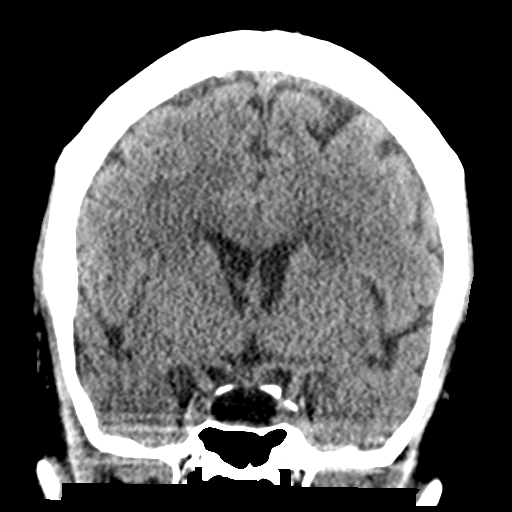

[Series 6: sagittal soft tissue · sagittal · 0.31mm/px · 3 of 52 slices shown]
[im 18/52  brain]
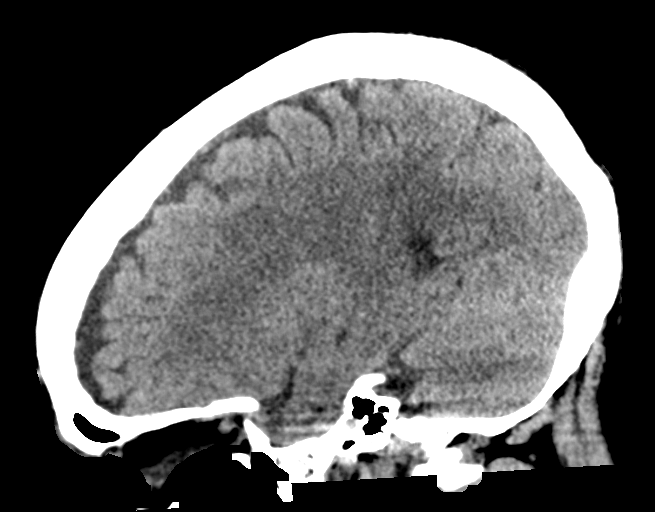
[im 26/52  brain]
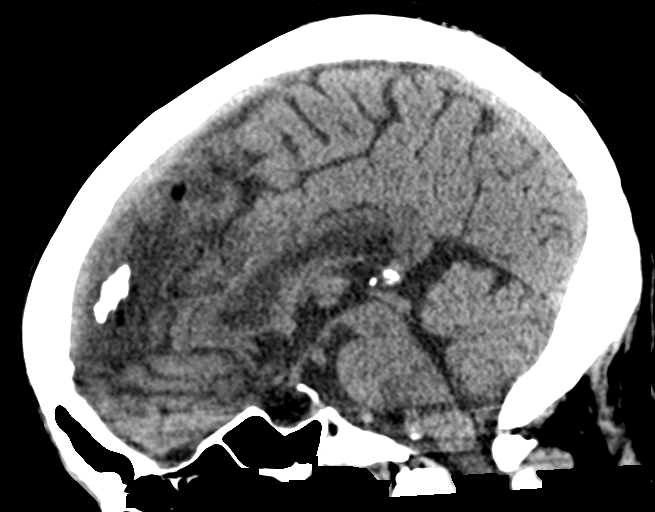
[im 35/52  brain]
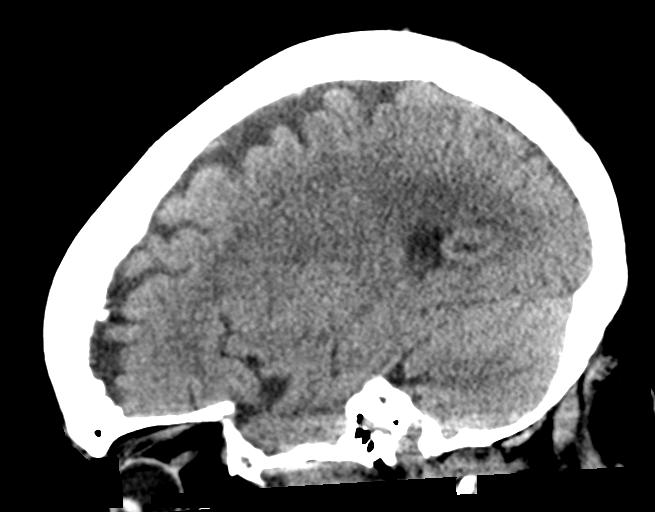

[16 of 47 positions shown; findings below may reference images not displayed]

FINDINGS: Brain: Periventricular white matter and corona radiata hypodensities
favor chronic ischemic microvascular white matter disease. Partially
empty sella. Otherwise, the brainstem, cerebellum, cerebral
peduncles, thalamus, basal ganglia, basilar cisterns, and
ventricular system appear within normal limits. No intracranial
hemorrhage, mass lesion, or acute CVA.

Vascular: There is atherosclerotic calcification of the cavernous
carotid arteries bilaterally.

Skull: Unremarkable

Sinuses/Orbits: Small left mastoid effusion.

Other: No supplemental non-categorized findings.
IMPRESSION: 1. No acute intracranial findings.
2. Periventricular white matter and corona radiata hypodensities
favor chronic ischemic microvascular white matter disease.
3. Atherosclerosis.
4. Small left mastoid effusion.

## 2024-06-04 IMAGING — DX DG KNEE 1-2V PORT*L*
2 series · 2 of 2 positions shown · non-contrast
Comparison: None Available.

CLINICAL DATA: Knee pain and swelling

EXAM:
PORTABLE LEFT KNEE - 1-2 VIEW

[abdomen kub (1 of 2)]
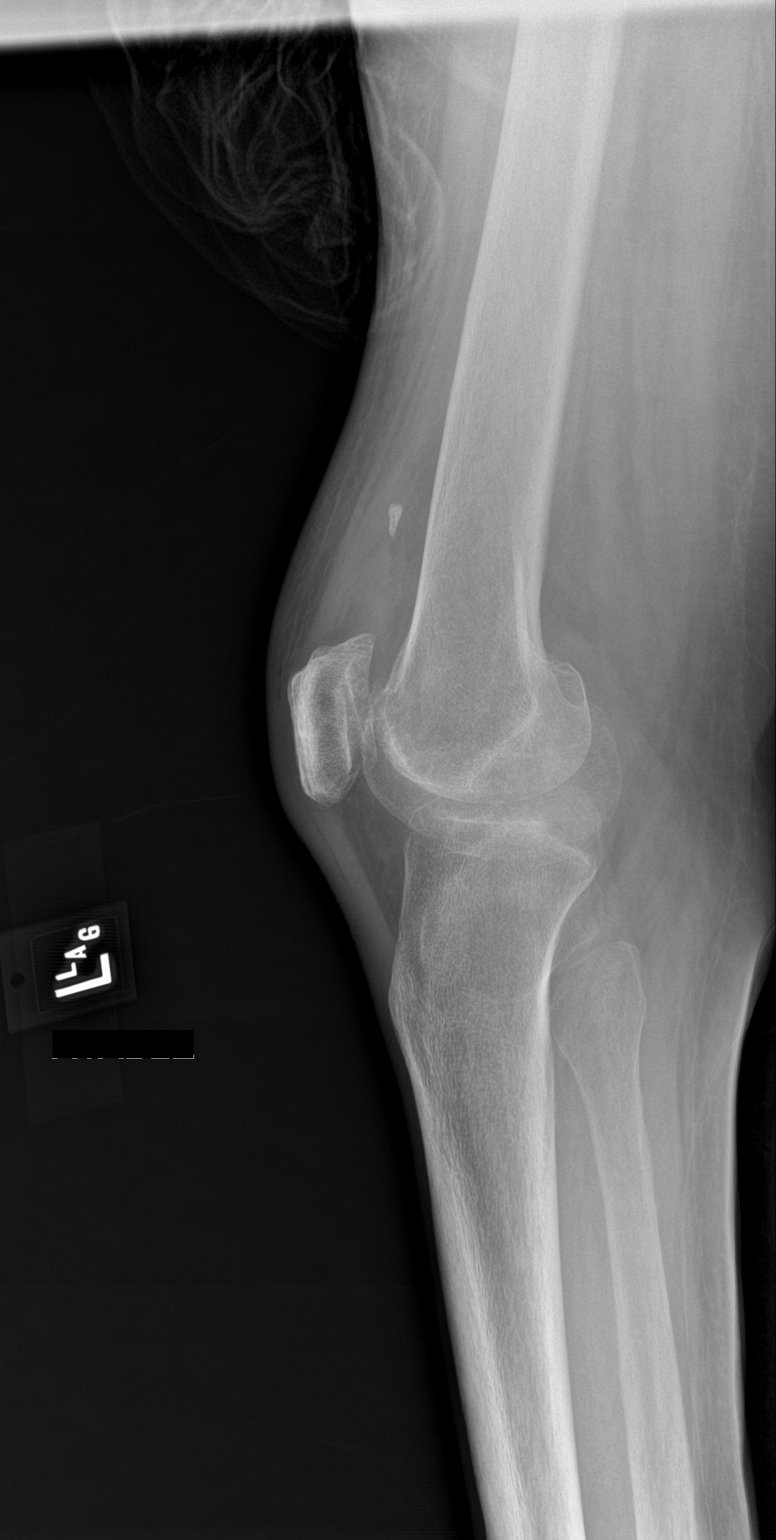

[abdomen kub (2 of 2)]
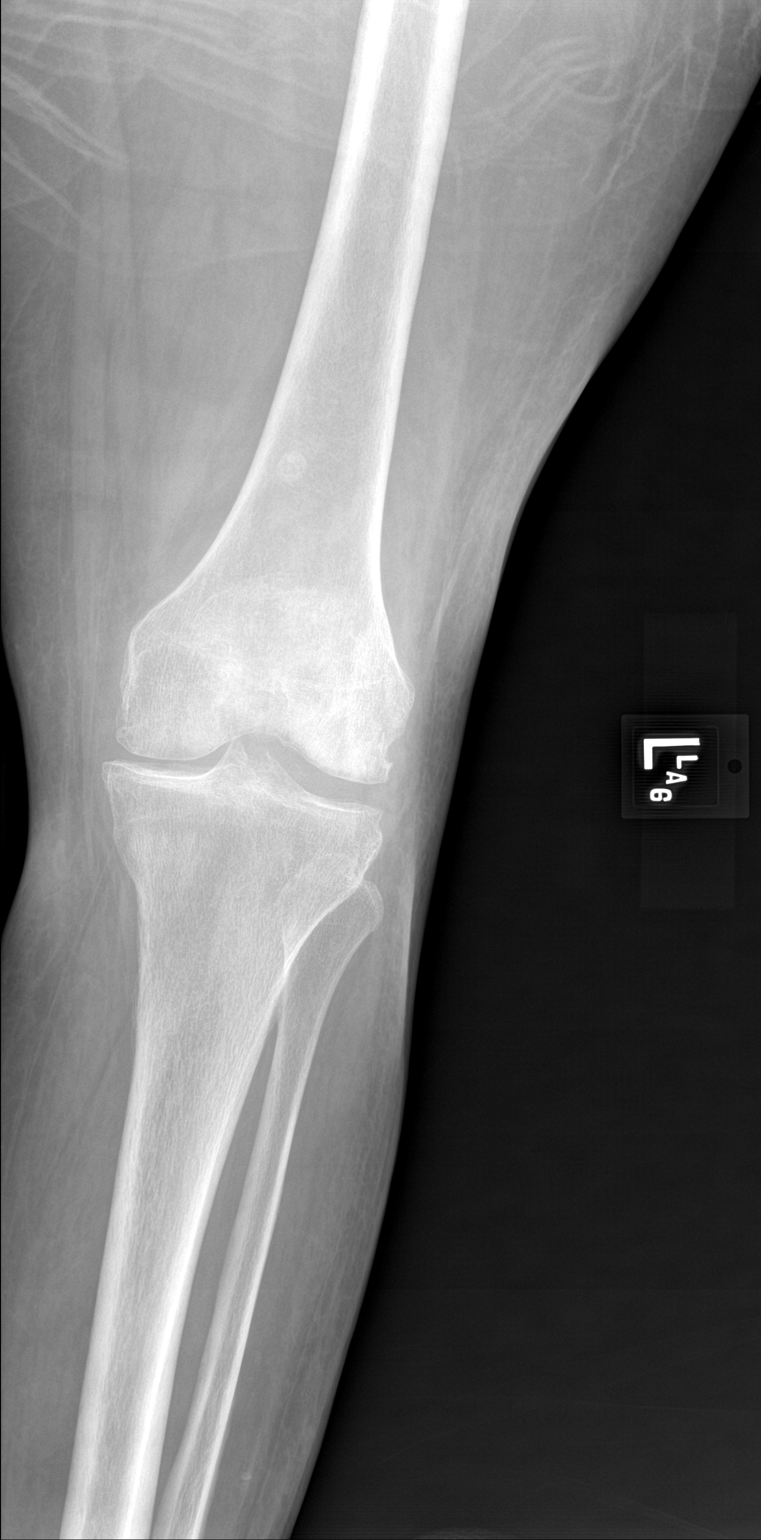

[2 of 2 positions shown; findings below may reference images not displayed]

FINDINGS: No acute fracture or dislocation. No aggressive osseous lesion.
Normal alignment. Generalized osteopenia. Mild medial femorotibial
compartment joint space narrowing. Mild patellofemoral compartment
joint space narrowing. Small joint effusion. Small well corticated
ossific fragment in the suprapatellar joint space likely reflecting
a small loose body measuring 7 mm.

Soft tissue are unremarkable. No radiopaque foreign body or soft
tissue emphysema.
IMPRESSION: 1. No acute osseous injury of the left knee.
2. Mild osteoarthritis of the medial femorotibial compartment and
patellofemoral compartment.

## 2024-06-08 IMAGING — CT CT HEAD W/O CM
3 series · 15 of 47 positions shown, 18 images · non-contrast
Comparison: 06/16/2021

CLINICAL DATA: Delirium.



[Series 2: head wo · axial · 0.47mm/px · z∈[+1243,+1388]mm · 9 of 35 slices shown, 12 images]
[im 3/35  brain]
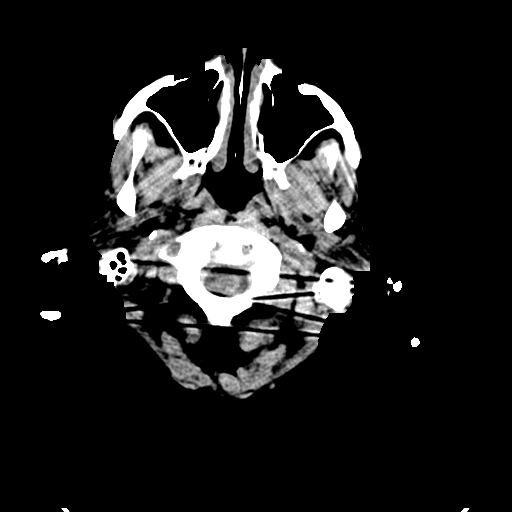
[im 3/35  bone]
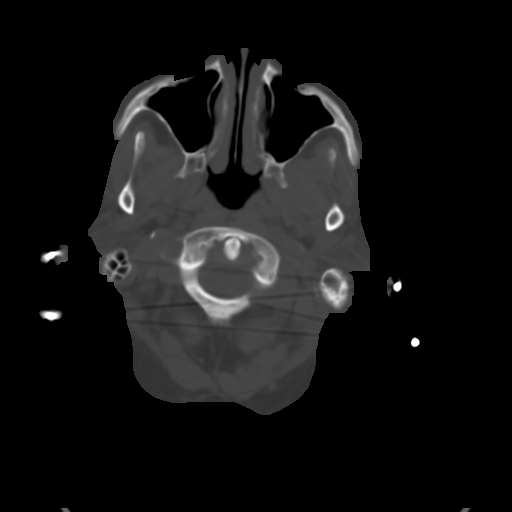
[im 6/35  brain]
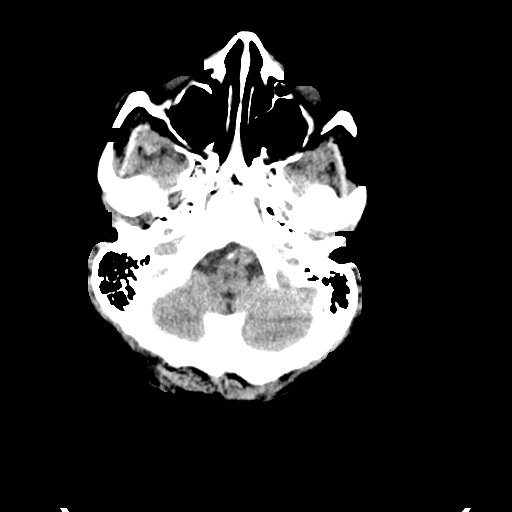
[im 10/35  brain]
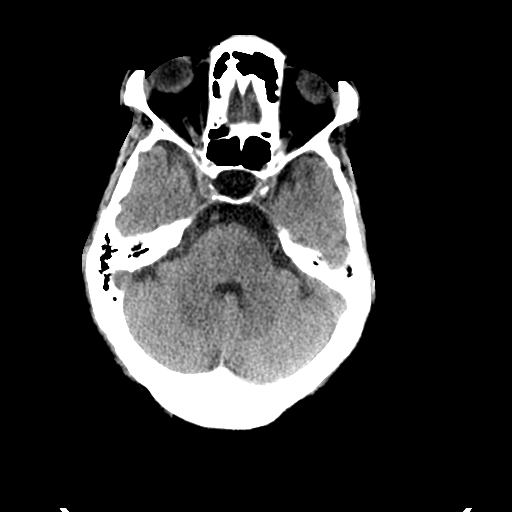
[im 13/35  brain]
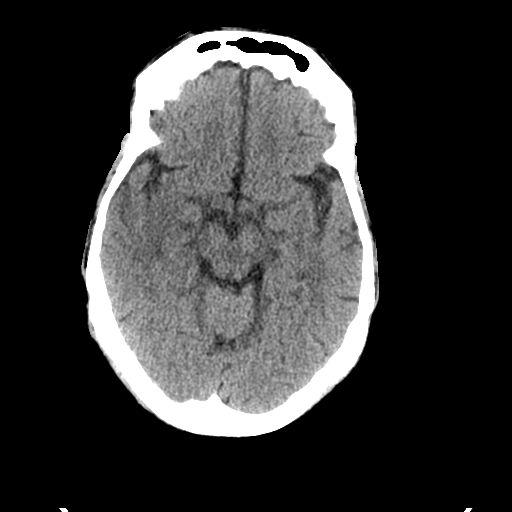
[im 18/35  brain]
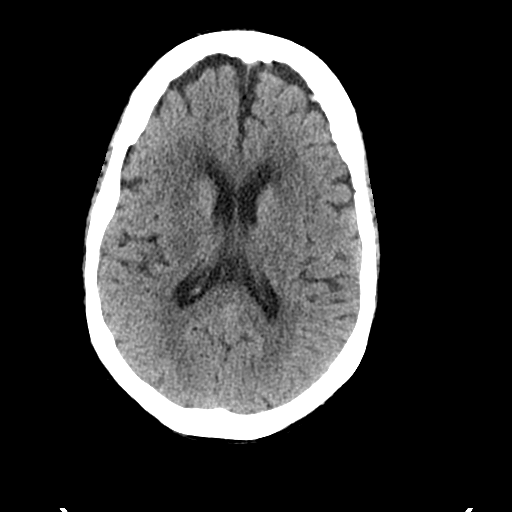
[im 18/35  bone]
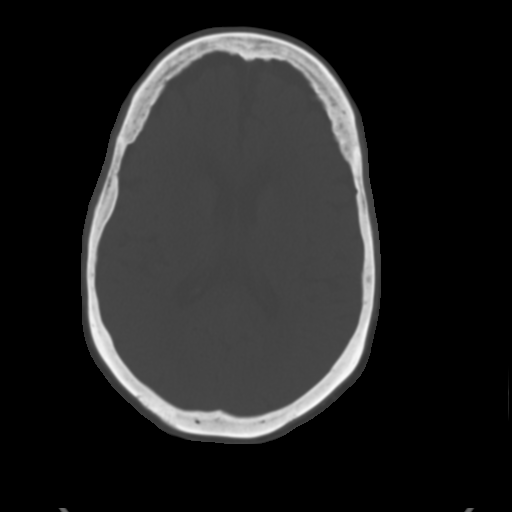
[im 22/35  brain]
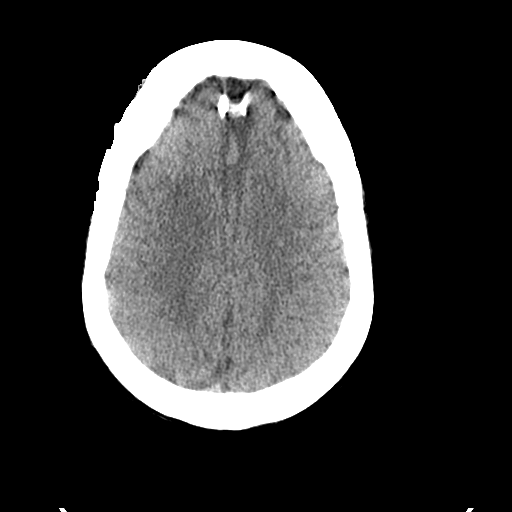
[im 25/35  brain]
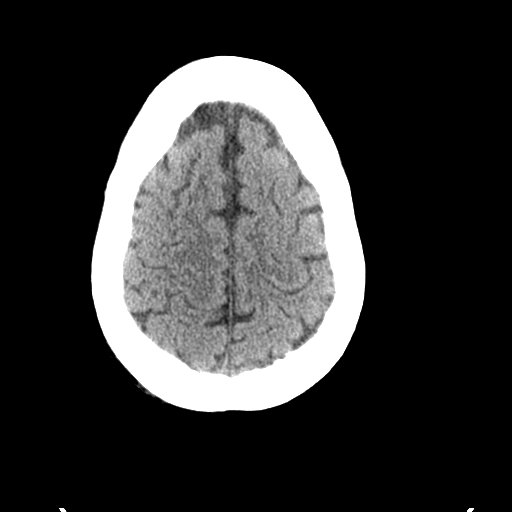
[im 29/35  brain]
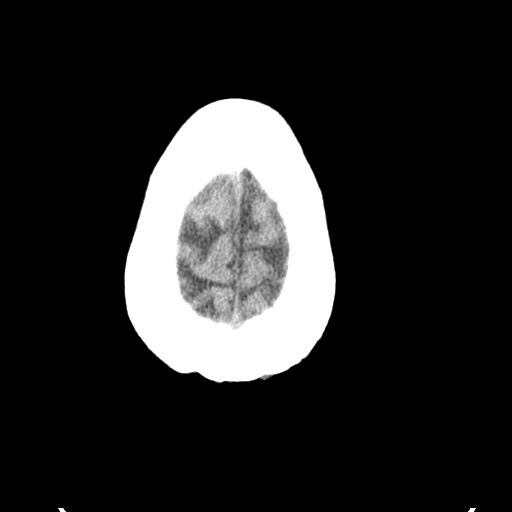
[im 32/35  brain]
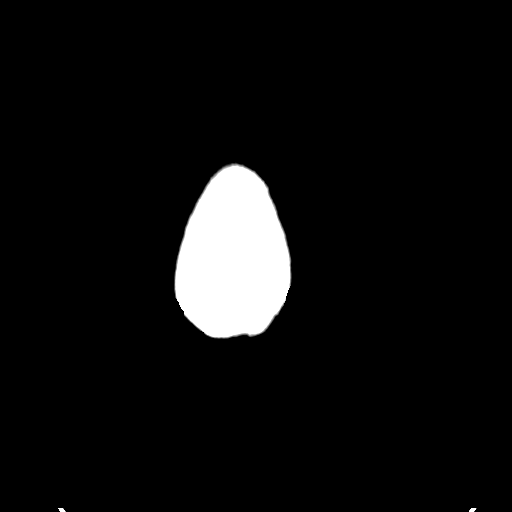
[im 32/35  bone]
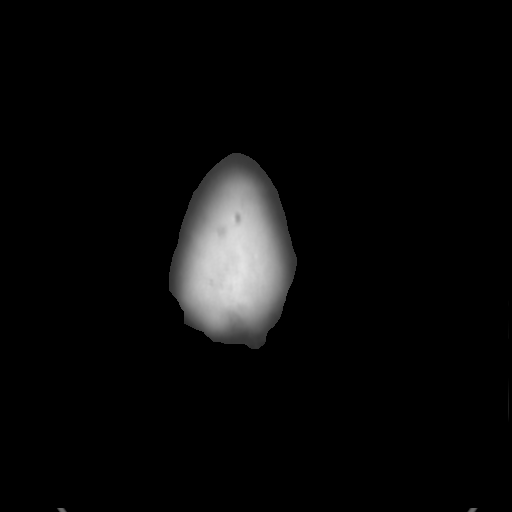

[Series 4: coronal soft tissue · coronal · 0.35mm/px · 3 of 69 slices shown]
[im 23/69  brain]
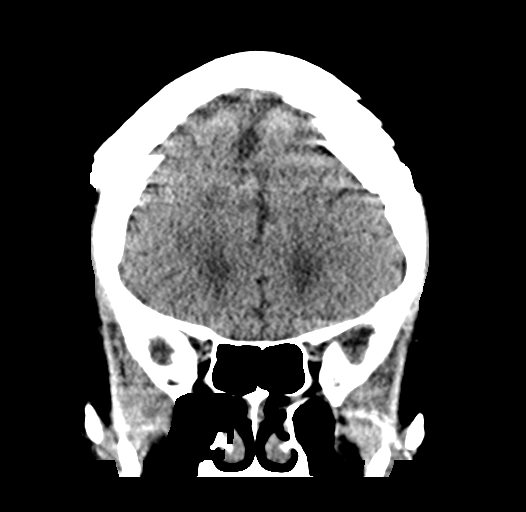
[im 31/69  brain]
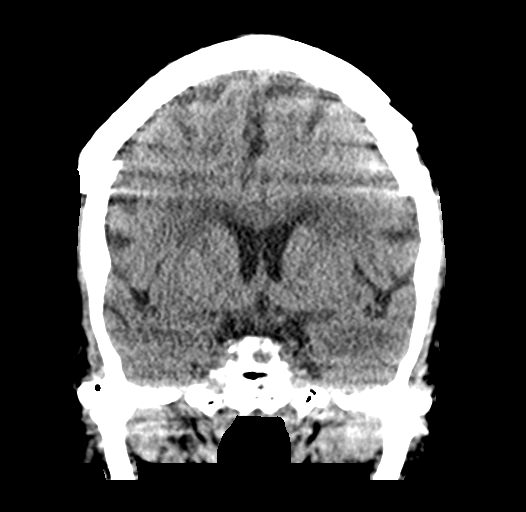
[im 38/69  brain]
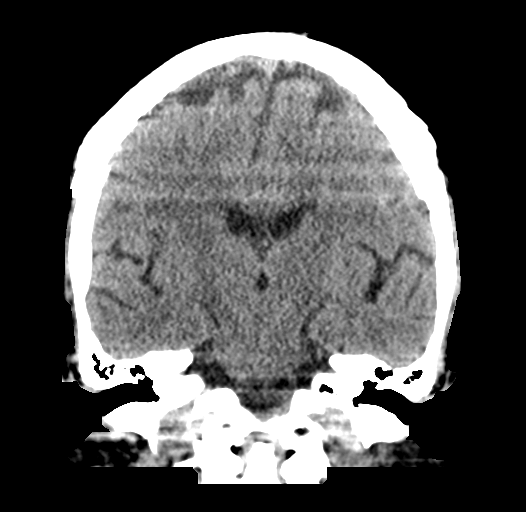

[Series 5: sagittal soft tissue · sagittal · 0.35mm/px · 3 of 54 slices shown]
[im 18/54  brain]
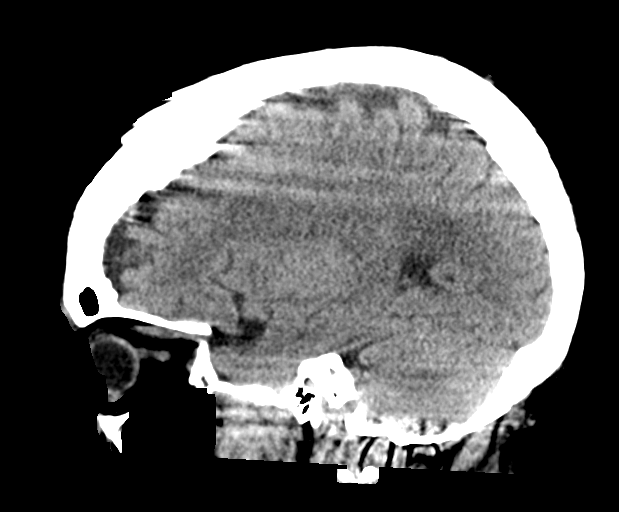
[im 27/54  brain]
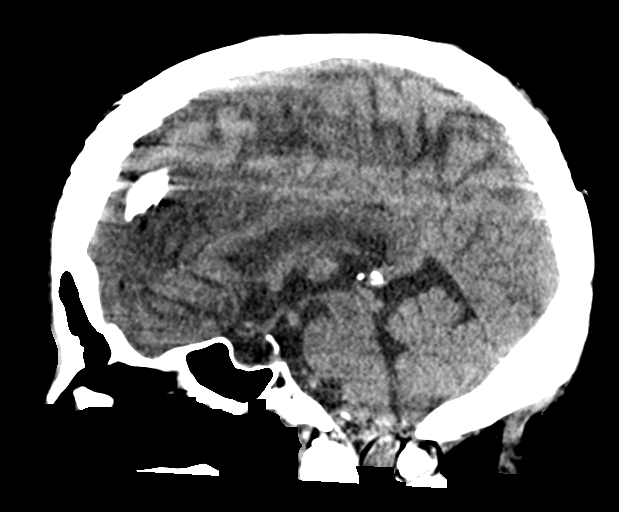
[im 36/54  brain]
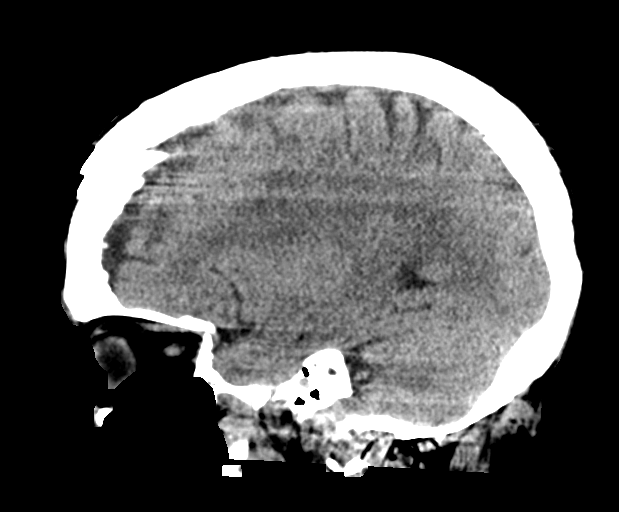

[15 of 47 positions shown; findings below may reference images not displayed]

FINDINGS: Brain: Motion degraded study. Within this limitation, there is no
evidence for acute hemorrhage, hydrocephalus, mass lesion, or
abnormal extra-axial fluid collection. No definite CT evidence for
acute infarction. Diffuse loss of parenchymal volume is consistent
with atrophy. Patchy low attenuation in the deep hemispheric and
periventricular white matter is nonspecific, but likely reflects
chronic microvascular ischemic demyelination.

Vascular: No hyperdense vessel or unexpected calcification.

Skull: No evidence for fracture. No worrisome lytic or sclerotic
lesion.

Sinuses/Orbits: The visualized paranasal sinuses are clear. Trace
left mastoid effusion again noted. Visualized portions of the globes
and intraorbital fat are unremarkable.

Other: None.
IMPRESSION: 1. No acute intracranial abnormality.
2. Atrophy with chronic small vessel ischemic disease.
